# Patient Record
Sex: Female | Born: 1990 | Race: Black or African American | Hispanic: No | Marital: Single | Smoking: Current some day smoker
Health system: Southern US, Community
[De-identification: ages and names within clinical notes are randomized; demographics above are authoritative.]

## PROBLEM LIST (undated history)

## (undated) ENCOUNTER — Emergency Department (HOSPITAL_COMMUNITY): Disposition: A | Discharge status: Home / Self Care | Payer: PRIVATE HEALTH INSURANCE

## (undated) ENCOUNTER — Emergency Department (HOSPITAL_COMMUNITY): Admission: EM | Payer: Medicaid Other | Source: Home / Self Care

## (undated) DIAGNOSIS — F419 Anxiety disorder, unspecified: Secondary | ICD-10-CM

## (undated) DIAGNOSIS — R51 Headache: Secondary | ICD-10-CM

## (undated) DIAGNOSIS — D5700 Hb-SS disease with crisis, unspecified: Secondary | ICD-10-CM

## (undated) DIAGNOSIS — R011 Cardiac murmur, unspecified: Secondary | ICD-10-CM

## (undated) DIAGNOSIS — A539 Syphilis, unspecified: Secondary | ICD-10-CM

## (undated) DIAGNOSIS — D473 Essential (hemorrhagic) thrombocythemia: Secondary | ICD-10-CM

## (undated) HISTORY — DX: Headache: R51

## (undated) HISTORY — PX: SPLENECTOMY: SUR1306

## (undated) HISTORY — DX: Essential (hemorrhagic) thrombocythemia: D47.3

---
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
## 2012-01-02 ENCOUNTER — Emergency Department (HOSPITAL_COMMUNITY)
Admission: EM | Admit: 2012-01-02 | Discharge: 2012-01-02 | Disposition: A | Discharge status: Home / Self Care | Payer: Medicaid Other | Attending: Emergency Medicine | Admitting: Emergency Medicine

## 2012-01-02 ENCOUNTER — Encounter (HOSPITAL_COMMUNITY): Payer: Self-pay | Admitting: *Deleted

## 2012-01-02 DIAGNOSIS — D5700 Hb-SS disease with crisis, unspecified: Secondary | ICD-10-CM | POA: Insufficient documentation

## 2012-01-02 DIAGNOSIS — M79609 Pain in unspecified limb: Secondary | ICD-10-CM | POA: Insufficient documentation

## 2012-01-02 LAB — BASIC METABOLIC PANEL
BUN: 6 mg/dL (ref 6–23)
GFR calc Af Amer: >90 mL/min (ref >90)
GFR calc non Af Amer: >90 mL/min (ref >90)
Potassium: 3.6 mEq/L (ref 3.5–5.1)
Sodium: 133 mEq/L — ABNORMAL LOW (ref 135–145)

## 2012-01-02 LAB — URINALYSIS, ROUTINE W REFLEX MICROSCOPIC
Hgb urine dipstick: NEGATIVE
Protein, ur: NEGATIVE mg/dL
Urobilinogen, UA: 2 mg/dL — ABNORMAL HIGH (ref 0.0–1.0)

## 2012-01-02 LAB — DIFFERENTIAL
Basophils Absolute: 0.1 10*3/uL (ref 0.0–0.1)
Basophils Relative: 0 % (ref 0–1)
Eosinophils Absolute: 0 10*3/uL (ref 0.0–0.7)
Monocytes Absolute: 2.8 10*3/uL — ABNORMAL HIGH (ref 0.1–1.0)
Neutro Abs: 12.7 10*3/uL — ABNORMAL HIGH (ref 1.7–7.7)
Neutrophils Relative %: 69 % (ref 43–77)

## 2012-01-02 LAB — RETICULOCYTES
RBC.: 3.35 MIL/uL — ABNORMAL LOW (ref 3.87–5.11)
Retic Ct Pct: 11.9 % — ABNORMAL HIGH (ref 0.4–3.1)

## 2012-01-02 LAB — CBC
Hemoglobin: 9.8 g/dL — ABNORMAL LOW (ref 12.0–15.0)
MCHC: 35.5 g/dL (ref 30.0–36.0)
RBC: 3.35 MIL/uL — ABNORMAL LOW (ref 3.87–5.11)

## 2012-01-02 MED ORDER — HYDROMORPHONE HCL PF 1 MG/ML IJ SOLN
1.0000 mg | Freq: Once | INTRAMUSCULAR | Status: AC
  Administered 2012-01-02: 1 mg via INTRAVENOUS
  Filled 2012-01-02: qty 1

## 2012-01-02 MED ORDER — ONDANSETRON HCL 4 MG/2ML IJ SOLN
4.0000 mg | Freq: Once | INTRAMUSCULAR | Status: AC
  Administered 2012-01-02: 4 mg via INTRAVENOUS
  Filled 2012-01-02: qty 2

## 2012-01-02 MED ORDER — SODIUM CHLORIDE 0.9 % IV BOLUS (SEPSIS)
1000.0000 mL | Freq: Once | INTRAVENOUS | Status: AC
  Administered 2012-01-02: 1000 mL via INTRAVENOUS

## 2012-01-02 NOTE — ED Notes (Signed)
Patient arrived to ED via cab.  She is complaining of sickle cell crisis. She states that she is having pain in her legs and between her shoulders. Her pain level is rated at 10 of 10.

## 2012-01-02 NOTE — Discharge Instructions (Signed)
Sickle Cell Pain Crisis Sickle cell anemia requires regular medical attention by your healthcare provider and awareness about when to seek medical care. Pain is a common problem in children with sickle cell disease. This usually starts at less than 19 year of age. Pain can occur nearly anywhere in the body but most commonly occurs in the extremities, back, chest, or belly (abdomen). Pain episodes can start suddenly or may follow an illness. These attacks can appear as decreased activity, loss of appetite, change in behavior, or simply complaints of pain. DIAGNOSIS   Specialized blood and gene testing can help make this diagnosis early in the disease. Blood tests may then be done to watch blood levels.   Specialized brain scans are done when there are problems in the brain during a crisis.   Lung testing may be done later in the disease.  HOME CARE INSTRUCTIONS   Maintain good hydration. Increase you or your child's fluid intake in hot weather and during exercise.   Avoid smoking. Smoking lowers the oxygen in the blood and can cause the production of sickle-shaped cells (sickling).   Control pain. Only take over-the-counter or prescription medicines for pain, discomfort, or fever as directed by your caregiver. Do not give aspirin to children because of the association with Reye's syndrome.   Keep regular health care checks to keep a proper red blood cell (hemoglobin) level. A moderate anemia level protects against sickling crises.   You and your child should receive all the same immunizations and care as the people around you.   Mothers should breastfeed their babies if possible. Use formulas with iron added if breastfeeding is not possible. Additional iron should not be given unless there is a lack of it. People with sickle cell disease (SCD) build up iron faster than normal. Give folic acid and additional vitamins as directed.   If you or your child has been prescribed antibiotics or other  medications to prevent problems, take them as directed.   Summer camps are available for children with SCD. They may help young people deal with their disease. The camps introduce them to other children with the same problem.   Young people with SCD may become frustrated or angry at their disease. This can cause rebellion and refusal to follow medical care. Help groups or counseling may help with these problems.   Wear a medical alert bracelet. When traveling, keep medical information, caregiver's names, and the medications you or your child takes with you at all times.  SEEK IMMEDIATE MEDICAL CARE IF:   You or your child develops dizziness or fainting, numbness in or difficulty with movement of arms and legs, difficulty with speech, or is acting abnormally. This could be early signs of a stroke. Immediate treatment is necessary.   You or your child has an oral temperature above 102 F (38.9 C), not controlled by medicine.   You or your child has other signs of infection (chills, lethargy, irritability, poor eating, vomiting). The younger the child, the more you should be concerned.   With fevers, do not give medicine to lower the fever right away. This could cover up a problem that is developing. Notify your caregiver.   You or your child develops pain that is not helped with medicine.   You or your child develops shortness of breath or is coughing up pus-like or bloody sputum.   You or your child develops any problems that are new and are causing you to worry.   You or   your child develops a persistent, often uncomfortable and painful penile erection. This is called priapism. Always check young boys for this. It is often embarrassing for them and they may not bring it to your attention. This is a medical emergency and needs immediate treatment. If this is not treated it will lead to impotence.   You or your child develops a new onset of abdominal pain, especially on the left side near the  stomach area.   You or your child has any questions or has problems that are not getting better. Return immediately if you feel your child is getting worse, even if your child was seen only a short while ago.  Document Released: 07/28/2005 Document Revised: 10/07/2011 Document Reviewed: 12/17/2009 [employer] Patient Information 2012 [employer]. 

## 2012-01-02 NOTE — ED Provider Notes (Signed)
History     CSN: [id number]  Arrival date & time 01/02/12  1136   First MD Initiated Contact with Patient 01/02/12 1257    1:30 PM  HPI Patient reports bilateral lower extremity pain that began 2 days ago. Reports pain is typical for her sickle cell pain. Reports pain is worsening. Denies chest pain, abdominal pain, nausea, vomiting, headache, myalgias. Denies known aggravating factor. Reports she does not have any medication at home her sickle cell exacerbations Patient is a 21 y.o. female presenting with sickle cell pain. The history is provided by the patient.  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 The current episode started 2 days ago. The onset was gradual. The problem occurs continuously. The problem has been gradually worsening. The pain is present in the lower extremities. The pain is similar to prior episodes. The pain is severe. The symptoms are relieved by nothing. Pertinent negatives include no chest pain, no abdominal pain, no nausea, no vomiting, no hematuria, no headaches, no back pain, no neck pain, no tingling, no weakness, no difficulty breathing and no eye pain. There is no swelling present.    Past Medical History  Diagnosis Date  . Sickle cell crisis     Past Surgical History  Procedure Date  . Splenectomy     No family history on file.  History  Substance Use Topics  . Smoking status: Never Smoker   . Smokeless tobacco: Not on file  . Alcohol Use: No    OB History    Grav Para Term Preterm Abortions TAB SAB Ect Mult Living                  Review of Systems  Constitutional: Negative for fever and chills.  HENT: Negative for neck pain.   Eyes: Negative for pain.  Respiratory: Negative for shortness of breath.   Cardiovascular: Negative for chest pain.  Gastrointestinal: Negative for nausea, vomiting, abdominal pain and blood in stool.  Genitourinary: Negative for hematuria.  Musculoskeletal: Negative for back pain.       Lower  extremity pain  Neurological: Negative for tingling, weakness and headaches.  All other systems reviewed and are negative.    Allergies  Review of patient's allergies indicates no known allergies.  Home Medications   Current Outpatient Rx  Name Route Sig Dispense Refill  . IBUPROFEN 200 MG PO TABS Oral Take 400 mg by mouth every 6 (six) hours as needed. pain    . PENICILLIN V POTASSIUM 250 MG PO TABS Oral Take 250 mg by mouth 2 (two) times daily. 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      BP 147/85  Pulse 78  Temp(Src) 98.7 F (37.1 C) (Oral)  Resp 18  SpO2 95%  LMP 12/29/2011  Physical Exam  Vitals reviewed. Constitutional: She is oriented to person, place, and time. Vital signs are normal. She appears well-developed and well-nourished.  HENT:  Head: Normocephalic and atraumatic.  Eyes: Conjunctivae are normal. Pupils are equal, round, and reactive to light.  Neck: Normal range of motion. Neck supple.  Cardiovascular: Normal rate, regular rhythm and normal heart sounds.  Exam reveals no friction rub.   No murmur heard. Pulmonary/Chest: Effort normal and breath sounds normal. She has no wheezes. She has no rhonchi. She has no rales. She exhibits no tenderness.  Abdominal: Soft. Bowel sounds are normal. She exhibits no mass. There is no tenderness. There  is no rebound and no guarding.  Musculoskeletal: Normal range of motion. She exhibits no edema and no tenderness.  Neurological: She is alert and oriented to person, place, and time. Coordination normal.  Skin: Skin is warm and dry. No rash noted. No erythema. No pallor.    ED Course  Procedures  Results for orders placed during the hospital encounter of 01/02/12  BASIC METABOLIC PANEL      Component Value Range   Sodium 133 (*) 135 - 145 (mEq/L)   Potassium 3.6  3.5 - 5.1 (mEq/L)   Chloride 97  96 - 112 (mEq/L)   CO2 26  19 - 32 (mEq/L)   Glucose, Bld 91   70 - 99 (mg/dL)   BUN 6  6 - 23 (mg/dL)   Creatinine, Ser 1.61  0.50 - 1.10 (mg/dL)   Calcium 9.6  8.4 - 09.6 (mg/dL)   GFR calc non Af Amer >90  >90 (mL/min)   GFR calc Af Amer >90  >90 (mL/min)  CBC      Component Value Range   WBC 18.3 (*) 4.0 - 10.5 (K/uL)   RBC 3.35 (*) 3.87 - 5.11 (MIL/uL)   Hemoglobin 9.8 (*) 12.0 - 15.0 (g/dL)   HCT 04.5 (*) 40.9 - 46.0 (%)   MCV 82.4  78.0 - 100.0 (fL)   MCH 29.3  26.0 - 34.0 (pg)   MCHC 35.5  30.0 - 36.0 (g/dL)   RDW 81.1 (*) 91.4 - 15.5 (%)   Platelets 670 (*) 150 - 400 (K/uL)  RETICULOCYTES      Component Value Range   Retic Ct Pct 11.9 (*) 0.4 - 3.1 (%)   RBC. 3.35 (*) 3.87 - 5.11 (MIL/uL)   Retic Count, Manual 398.7 (*) 19.0 - 186.0 (K/uL)  URINALYSIS, ROUTINE W REFLEX MICROSCOPIC      Component Value Range   Color, Urine YELLOW  YELLOW    APPearance CLEAR  CLEAR    Specific Gravity, Urine 1.012  1.005 - 1.030    pH 7.0  5.0 - 8.0    Glucose, UA NEGATIVE  NEGATIVE (mg/dL)   Hgb urine dipstick NEGATIVE  NEGATIVE    Bilirubin Urine NEGATIVE  NEGATIVE    Ketones, ur TRACE (*) NEGATIVE (mg/dL)   Protein, ur NEGATIVE  NEGATIVE (mg/dL)   Urobilinogen, UA 2.0 (*) 0.0 - 1.0 (mg/dL)   Nitrite NEGATIVE  NEGATIVE    Leukocytes, UA NEGATIVE  NEGATIVE   DIFFERENTIAL      Component Value Range   Neutrophils Relative 69  43 - 77 (%)   Neutro Abs 12.7 (*) 1.7 - 7.7 (K/uL)   Lymphocytes Relative 15  12 - 46 (%)   Lymphs Abs 2.8  0.7 - 4.0 (K/uL)   Monocytes Relative 15 (*) 3 - 12 (%)   Monocytes Absolute 2.8 (*) 0.1 - 1.0 (K/uL)   Eosinophils Relative 0  0 - 5 (%)   Eosinophils Absolute 0.0  0.0 - 0.7 (K/uL)   Basophils Relative 0  0 - 1 (%)   Basophils Absolute 0.1  0.0 - 0.1 (K/uL)  PREGNANCY, URINE      Component Value Range   Preg Test, Ur NEGATIVE  NEGATIVE    No results found.   MDM   2:57 PM Patient reports improvement of pain. States pain is now an 8/10. Patient is no longer writhing in bed and appears to be resting  comfortably with the lights turned down. Will treat with one more milligram of Dilaudid  and reassess.   5:54 PM Has had a total of 3 mg Dilaudid. Reports pain is down to 4/10. States pain is now manageable. Will discharge home. Advised Tylenol and ibuprofen for pain as needed. Patient requests oral narcotic prescription. Advised patient that she should followup with primary care physician, and Tylenol and ibuprofen should be sufficient. Advise persisting use or narcotic medications can cause addiction and tolerance. Patient voices understanding and is ready for discharge     Thomasene Lot, Cordelia Poche 01/02/12 1756

## 2012-01-04 NOTE — ED Provider Notes (Signed)
Medical screening examination/treatment/procedure(s) were performed by non-physician practitioner and as supervising physician I was immediately available for consultation/collaboration.  Toy Baker, MD 01/04/12 [phone]

## 2012-01-11 ENCOUNTER — Emergency Department (HOSPITAL_COMMUNITY): Payer: Medicaid Other

## 2012-01-11 ENCOUNTER — Encounter (HOSPITAL_COMMUNITY): Payer: Self-pay

## 2012-01-11 ENCOUNTER — Emergency Department (HOSPITAL_COMMUNITY)
Admission: EM | Admit: 2012-01-11 | Discharge: 2012-01-11 | Disposition: A | Discharge status: Home / Self Care | Payer: Medicaid Other | Attending: Emergency Medicine | Admitting: Emergency Medicine

## 2012-01-11 ENCOUNTER — Other Ambulatory Visit: Payer: Self-pay

## 2012-01-11 DIAGNOSIS — R791 Abnormal coagulation profile: Secondary | ICD-10-CM | POA: Insufficient documentation

## 2012-01-11 DIAGNOSIS — R071 Chest pain on breathing: Secondary | ICD-10-CM | POA: Insufficient documentation

## 2012-01-11 DIAGNOSIS — Z79899 Other long term (current) drug therapy: Secondary | ICD-10-CM | POA: Insufficient documentation

## 2012-01-11 DIAGNOSIS — R000 Tachycardia, unspecified: Secondary | ICD-10-CM | POA: Insufficient documentation

## 2012-01-11 DIAGNOSIS — M940 Chondrocostal junction syndrome [Tietze]: Secondary | ICD-10-CM | POA: Insufficient documentation

## 2012-01-11 DIAGNOSIS — D571 Sickle-cell disease without crisis: Secondary | ICD-10-CM | POA: Insufficient documentation

## 2012-01-11 LAB — BASIC METABOLIC PANEL
Calcium: 9.8 mg/dL (ref 8.4–10.5)
GFR calc Af Amer: >90 mL/min (ref >90)
GFR calc non Af Amer: >90 mL/min (ref >90)
Glucose, Bld: 89 mg/dL (ref 70–99)
Potassium: 3.9 mEq/L (ref 3.5–5.1)
Sodium: 138 mEq/L (ref 135–145)

## 2012-01-11 LAB — D-DIMER, QUANTITATIVE: D-Dimer, Quant: 1.41 ug/mL-FEU — ABNORMAL HIGH (ref 0.00–0.48)

## 2012-01-11 LAB — DIFFERENTIAL
Basophils Absolute: 0 10*3/uL (ref 0.0–0.1)
Eosinophils Absolute: 0.1 10*3/uL (ref 0.0–0.7)
Lymphs Abs: 3.2 10*3/uL (ref 0.7–4.0)
Neutrophils Relative %: 73 % (ref 43–77)

## 2012-01-11 LAB — RETICULOCYTES
RBC.: 3.24 MIL/uL — ABNORMAL LOW (ref 3.87–5.11)
Retic Ct Pct: 5.7 % — ABNORMAL HIGH (ref 0.4–3.1)

## 2012-01-11 LAB — CBC
MCH: 28.1 pg (ref 26.0–34.0)
Platelets: 720 10*3/uL — ABNORMAL HIGH (ref 150–400)
RBC: 3.24 MIL/uL — ABNORMAL LOW (ref 3.87–5.11)
RDW: 15.6 % — ABNORMAL HIGH (ref 11.5–15.5)
WBC: 17.7 10*3/uL — ABNORMAL HIGH (ref 4.0–10.5)

## 2012-01-11 MED ORDER — IOHEXOL 300 MG/ML  SOLN
100.0000 mL | Freq: Once | INTRAMUSCULAR | Status: AC | PRN
  Administered 2012-01-11: 100 mL via INTRAVENOUS

## 2012-01-11 MED ORDER — NAPROXEN 500 MG PO TABS: 500.0000 mg | ORAL_TABLET | Freq: Two times a day (BID) | ORAL | Status: DC

## 2012-01-11 MED ORDER — SODIUM CHLORIDE 0.9 % IV BOLUS (SEPSIS)
1000.0000 mL | Freq: Once | INTRAVENOUS | Status: AC
  Administered 2012-01-11: 1000 mL via INTRAVENOUS

## 2012-01-11 MED ORDER — HYDROMORPHONE HCL PF 1 MG/ML IJ SOLN
1.0000 mg | Freq: Once | INTRAMUSCULAR | Status: AC
  Administered 2012-01-11: 1 mg via INTRAVENOUS
  Filled 2012-01-11: qty 1

## 2012-01-11 MED ORDER — KETOROLAC TROMETHAMINE 30 MG/ML IJ SOLN
30.0000 mg | Freq: Once | INTRAMUSCULAR | Status: AC
  Administered 2012-01-11: 30 mg via INTRAVENOUS
  Filled 2012-01-11: qty 1

## 2012-01-11 NOTE — ED Provider Notes (Signed)
History     CSN: [id number]  Arrival date & time 01/11/12  0865   First MD Initiated Contact with Patient 01/11/12 [phone]      Chief Complaint  Patient presents with  . Chest Pain    (Consider location/radiation/quality/duration/timing/severity/associated sxs/prior treatment) HPI Comments: Hx sickle cell disease. Complains of substernal cp  Patient is a 21 y.o. female presenting with chest pain. The history is provided by the patient. No language interpreter was used.  Chest Pain The chest pain began 1 - 2 weeks ago. Chest pain occurs constantly. The chest pain is worsening. The pain is associated with breathing. The severity of the pain is moderate. The quality of the pain is described as aching. The pain does not radiate. Chest pain is worsened by deep breathing and certain positions. Pertinent negatives for primary symptoms include no fever, no fatigue, no shortness of breath, no cough, no palpitations, no abdominal pain, no nausea, no vomiting and no dizziness.  Pertinent negatives for associated symptoms include no numbness and no weakness. She tried nothing for the symptoms. Risk factors include no known risk factors.     Past Medical History  Diagnosis Date  . Sickle cell crisis     Past Surgical History  Procedure Date  . Splenectomy     History reviewed. No pertinent family history.  History  Substance Use Topics  . Smoking status: Never Smoker   . Smokeless tobacco: Never Used  . Alcohol Use: No    OB History    Grav Para Term Preterm Abortions TAB SAB Ect Mult Living                  Review of Systems  Constitutional: Negative for fever, chills, activity change, appetite change and fatigue.  HENT: Negative for congestion, sore throat, rhinorrhea, neck pain and neck stiffness.   Respiratory: Negative for cough and shortness of breath.   Cardiovascular: Positive for chest pain. Negative for palpitations.  Gastrointestinal: Negative for nausea, vomiting and  abdominal pain.  Genitourinary: Negative for dysuria, urgency, frequency and flank pain.  Musculoskeletal: Negative for myalgias, back pain and arthralgias.  Neurological: Negative for dizziness, weakness, light-headedness, numbness and headaches.  All other systems reviewed and are negative.    Allergies  Review of patient's allergies indicates no known allergies.  Home Medications   Current Outpatient Rx  Name Route Sig Dispense Refill  . ACETAMINOPHEN 500 MG PO TABS Oral Take 500 mg by mouth every 6 (six) hours as needed. For pain    . IBUPROFEN 200 MG PO TABS Oral Take 400 mg by mouth every 6 (six) hours as needed. For pain    . PENICILLIN V POTASSIUM 250 MG PO TABS Oral Take 250 mg by mouth 2 (two) times daily. 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 NAPROXEN 500 MG PO TABS Oral Take 1 tablet (500 mg total) by mouth 2 (two) times daily. 30 tablet 0    BP 114/71  Pulse 95  Temp(Src) 97.1 F (36.2 C) (Oral)  Resp 17  SpO2 99%  LMP 12/29/2011  Physical Exam  Nursing note and vitals reviewed. Constitutional: She is oriented to person, place, and time. She appears well-developed and well-nourished. No distress.  HENT:  Head: Normocephalic and atraumatic.  Mouth/Throat: Oropharynx is clear and moist. No oropharyngeal exudate.  Eyes: Conjunctivae and EOM are normal. Pupils are equal, round, and reactive to light.  Neck: Normal range of motion. Neck supple.  Cardiovascular: Regular rhythm, normal heart sounds and intact distal pulses.  Exam reveals no gallop and no friction rub.   No murmur heard.      Tachycardic rate  Pulmonary/Chest: Effort normal and breath sounds normal. No respiratory distress. She exhibits tenderness.  Abdominal: Soft. Bowel sounds are normal. There is no tenderness.  Musculoskeletal: Normal range of motion. She exhibits no tenderness.  Neurological: She is alert and oriented to person, place, and time. No cranial nerve  deficit.  Skin: Skin is warm and dry. No rash noted.    ED Course  Procedures (including critical care time)   Date: 01/11/2012  Rate: 98  Rhythm: normal sinus rhythm  QRS Axis: normal  Intervals: normal  ST/T Wave abnormalities: normal  Conduction Disutrbances:none  Narrative Interpretation:   Old EKG Reviewed: unchanged  Labs Reviewed  CBC - Abnormal; Notable for the following:    WBC 17.7 (*)    RBC 3.24 (*)    Hemoglobin 9.1 (*)    HCT 26.2 (*)    RDW 15.6 (*)    Platelets 720 (*)    All other components within normal limits  DIFFERENTIAL - Abnormal; Notable for the following:    Neutro Abs 12.9 (*)    Monocytes Absolute 1.6 (*)    All other components within normal limits  D-DIMER, QUANTITATIVE - Abnormal; Notable for the following:    D-Dimer, Quant 1.41 (*)    All other components within normal limits  RETICULOCYTES - Abnormal; Notable for the following:    Retic Ct Pct 5.7 (*)    RBC. 3.24 (*)    All other components within normal limits  BASIC METABOLIC PANEL   Dg Chest 2 View  01/11/2012  *RADIOLOGY REPORT*  Clinical Data: Sickle cell crisis  CHEST - 2 VIEW  Comparison: 11/02/2010  Findings: The heart size and mediastinal contours are within normal limits.  Both lungs are clear. Skeletal changes of sickle cell disease identified within the spine.  IMPRESSION: No active cardiopulmonary abnormalities.  Original Report Authenticated By: Rosealee Albee, M.D.   Ct Angio Chest W/cm &/or Wo Cm  01/11/2012  *RADIOLOGY REPORT*  Clinical Data: Chest pain for the past week.  Sickle cell crisis.  CT ANGIOGRAPHY CHEST  Technique:  Multidetector CT imaging of the chest using the standard protocol during bolus administration of intravenous contrast. Multiplanar reconstructed images including MIPs were obtained and reviewed to evaluate the vascular anatomy.  Contrast: OMNIPAQUE IOHEXOL 300 MG/ML IJ SOLN  Comparison: 08/23/2010 and chest radiographs obtained earlier today.   Findings: The pulmonary arteries are suboptimally opacified with no pulmonary arterial filling defects seen.  Minimal atelectasis at both lung bases.  No lung masses or enlarged lymph nodes.  The patient previously had a prominent thymus gland. This has changed configuration, wider than previously demonstrated and decreased in AP diameter.  This is oval in shape superiorly. The patient has H- shaped vertebrae, compatible with the history of sickle cell disease.  On review of her labs, she is anemic.  IMPRESSION:  1.  No pulmonary emboli seen. 2.  Minimal bibasilar atelectasis. 3.  Overall increase in prominence of anterior mediastinal soft tissue.  This could represent an increasingly prominent thymus gland or anterior mediastinal extramedullary hematopoeisis.  Original Report Authenticated By: Darrol Angel, M.D.     1. Costochondritis, acute       MDM  Secondary to acute costochondritis. I have no concern about this being a sickle cell crisis or acute chest  syndrome. She is afebrile with no cough pain was treated numerous department she'll be discharged home. Ulnar embolus was ruled out after an elevated d-dimer with a CT angioma which was negative. Instructed to followup with her primary care physician.  Return precautions provided        Dayton Bailiff, MD 01/11/12 1315

## 2012-01-11 NOTE — ED Notes (Signed)
Patient reports that she is having pain under left breast area and has a history of sickle cell. Patient states this is not her typical pain. Patient reports pain began a week ago and has gotten progressively worse. Patient rates pain 5/10.  Patient states her last sickle cell crisis was 01/01/12

## 2012-04-05 ENCOUNTER — Encounter (HOSPITAL_COMMUNITY): Payer: Self-pay

## 2012-04-05 ENCOUNTER — Emergency Department (HOSPITAL_COMMUNITY)
Admission: EM | Admit: 2012-04-05 | Discharge: 2012-04-05 | Disposition: A | Discharge status: Home / Self Care | Payer: Self-pay | Attending: Emergency Medicine | Admitting: Emergency Medicine

## 2012-04-05 DIAGNOSIS — R109 Unspecified abdominal pain: Secondary | ICD-10-CM | POA: Insufficient documentation

## 2012-04-05 DIAGNOSIS — O99891 Other specified diseases and conditions complicating pregnancy: Secondary | ICD-10-CM | POA: Insufficient documentation

## 2012-04-05 DIAGNOSIS — Z3490 Encounter for supervision of normal pregnancy, unspecified, unspecified trimester: Secondary | ICD-10-CM

## 2012-04-05 LAB — URINALYSIS, ROUTINE W REFLEX MICROSCOPIC
Bilirubin Urine: NEGATIVE
Glucose, UA: NEGATIVE mg/dL
Hgb urine dipstick: NEGATIVE
Ketones, ur: NEGATIVE mg/dL
Protein, ur: NEGATIVE mg/dL
Urobilinogen, UA: 1 mg/dL (ref 0.0–1.0)

## 2012-04-05 MED ORDER — OXYCODONE-ACETAMINOPHEN 5-325 MG PO TABS
1.0000 | ORAL_TABLET | Freq: Once | ORAL | Status: AC
  Administered 2012-04-05: 1 via ORAL
  Filled 2012-04-05: qty 1

## 2012-04-05 NOTE — Discharge Instructions (Signed)
Call women's clinic listed above for an outpatient appointment to followup for your pregnancy.  Read the instructions below to learn more about pregnancy.  Return to the emergency department if you develop worsening abdominal pain or bleeding.  ABCs of Pregnancy A Antepartum care is very important. Be sure you see your doctor and get prenatal care as soon as you think you are pregnant. At this time, you will be tested for infection, genetic abnormalities and potential problems with you and the pregnancy. This is the time to discuss diet, exercise, work, medications, labor, pain medication during labor and the possibility of a cesarean delivery. Ask any questions that may concern you. It is important to see your doctor regularly throughout your pregnancy. Avoid exposure to toxic substances and chemicals - such as cleaning solvents, lead and mercury, some insecticides, and paint. Pregnant women should avoid exposure to paint fumes, and fumes that cause you to feel ill, dizzy or faint. When possible, it is a good idea to have a pre-pregnancy consultation with your caregiver to begin some important recommendations your caregiver suggests such as, taking folic acid, exercising, quitting smoking, avoiding alcoholic beverages, etc. B Breastfeeding is the healthiest choice for both you and your baby. It has many nutritional benefits for the baby and health benefits for the mother. It also creates a very tight and loving bond between the baby and mother. Talk to your doctor, your family and friends, and your employer about how you choose to feed your baby and how they can support you in your decision. Not all birth defects can be prevented, but a woman can take actions that may increase her chance of having a healthy baby. Many birth defects happen very early in pregnancy, sometimes before a woman even knows she is pregnant. Birth defects or abnormalities of any child in your or the father's family should be discussed  with your caregiver. Get a good support bra as your breast size changes. Wear it especially when you exercise and when nursing.  C Celebrate the news of your pregnancy with the your spouse/father and family. Childbirth classes are helpful to take for you and the spouse/father because it helps to understand what happens during the pregnancy, labor and delivery. Cesarean delivery should be discussed with your doctor so you are prepared for that possibility. The pros and cons of circumcision if it is a boy, should be discussed with your pediatrician. Cigarette smoking during pregnancy can result in low birth weight babies. It has been associated with infertility, miscarriages, tubal pregnancies, infant death (mortality) and poor health (morbidity) in childhood. Additionally, cigarette smoking may cause long-term learning disabilities. If you smoke, you should try to quit before getting pregnant and not smoke during the pregnancy. Secondary smoke may also harm a mother and her developing baby. It is a good idea to ask people to stop smoking around you during your pregnancy and after the baby is born. Extra calcium is necessary when you are pregnant and is found in your prenatal vitamin, in dairy products, green leafy vegetables and in calcium supplements. D A healthy diet according to your current weight and height, along with vitamins and mineral supplements should be discussed with your caregiver. Domestic abuse or violence should be made known to your doctor right away to get the situation corrected. Drink more water when you exercise to keep hydrated. Discomfort of your back and legs usually develops and progresses from the middle of the second trimester through to delivery of the baby.  This is because of the enlarging baby and uterus, which may also affect your balance. Do not take illegal drugs. Illegal drugs can seriously harm the baby and you. Drink extra fluids (water is best) throughout pregnancy to help  your body keep up with the increases in your blood volume. Drink at least 6 to 8 glasses of water, fruit juice, or milk each day. A good way to know you are drinking enough fluid is when your urine looks almost like clear water or is very light yellow.  E Eat healthy to get the nutrients you and your unborn baby need. Your meals should include the five basic food groups. Exercise (30 minutes of light to moderate exercise a day) is important and encouraged during pregnancy, if there are no medical problems or problems with the pregnancy. Exercise that causes discomfort or dizziness should be stopped and reported to your caregiver. Emotions during pregnancy can change from being ecstatic to depression and should be understood by you, your partner and your family. F Fetal screening with ultrasound, amniocentesis and monitoring during pregnancy and labor is common and sometimes necessary. Take 400 micrograms of folic acid daily both before, when possible, and during the first few months of pregnancy to reduce the risk of birth defects of the brain and spine. All women who could possibly become pregnant should take a vitamin with folic acid, every day. It is also important to eat a healthy diet with fortified foods (enriched grain products, including cereals, rice, breads, and pastas) and foods with natural sources of folate (orange juice, green leafy vegetables, beans, peanuts, broccoli, asparagus, peas, and lentils). The father should be involved with all aspects of the pregnancy including, the prenatal care, childbirth classes, labor, delivery, and postpartum time. Fathers may also have emotional concerns about being a father, financial needs, and raising a family. G Genetic testing should be done appropriately. It is important to know your family and the father's history. If there have been problems with pregnancies or birth defects in your family, report these to your doctor. Also, genetic counselors can talk  with you about the information you might need in making decisions about having a family. You can call a major medical center in your area for help in finding a board-certified genetic counselor. Genetic testing and counseling should be done before pregnancy when possible, especially if there is a history of problems in the mother's or father's family. Certain ethnic backgrounds are more at risk for genetic defects. H Get familiar with the hospital where you will be having your baby. Get to know how long it takes to get there, the labor and delivery area, and the hospital procedures. Be sure your medical insurance is accepted there. Get your home ready for the baby including, clothes, the baby's room (when possible), furniture and car seat. Hand washing is important throughout the day, especially after handling raw meat and poultry, changing the baby's diaper or using the bathroom. This can help prevent the spread of many bacteria and viruses that cause infection. Your hair may become dry and thinner, but will return to normal a few weeks after the baby is born. Heartburn is a common problem that can be treated by taking antacids recommended by your caregiver, eating smaller meals 5 or 6 times a day, not drinking liquids when eating, drinking between meals and raising the head of your bed 2 to 3 inches. I Insurance to cover you, the baby, doctor and hospital should be reviewed so that  you will be prepared to pay any costs not covered by your insurance plan. If you do not have medical insurance, there are usually clinics and services available for you in your community. Take 30 milligrams of iron during your pregnancy as prescribed by your doctor to reduce the risk of low red blood cells (anemia) later in pregnancy. All women of childbearing age should eat a diet rich in iron. J There should be a joint effort for the mother, father and any other children to adapt to the pregnancy financially, emotionally, and  psychologically during the pregnancy. Join a support group for moms-to-be. Or, join a class on parenting or childbirth. Have the family participate when possible. K Know your limits. Let your caregiver know if you experience any of the following:   Pain of any kind.   Strong cramps.   You develop a lot of weight in a short period of time (5 pounds in 3 to 5 days).   Vaginal bleeding, leaking of amniotic fluid.   Headache, vision problems.   Dizziness, fainting, shortness of breath.   Chest pain.   Fever of 102 F (38.9 C) or higher.   Gush of clear fluid from your vagina.   Painful urination.   Domestic violence.   Irregular heartbeat (palpitations).   Rapid beating of the heart (tachycardia).   Constant feeling sick to your stomach (nauseous) and vomiting.   Trouble walking, fluid retention (edema).   Muscle weakness.   If your baby has decreased activity.   Persistent diarrhea.   Abnormal vaginal discharge.   Uterine contractions at 20-minute intervals.   Back pain that travels down your leg.  L Learn and practice that what you eat and drink should be in moderation and healthy for you and your baby. Legal drugs such as alcohol and caffeine are important issues for pregnant women. There is no safe amount of alcohol a woman can drink while pregnant. Fetal alcohol syndrome, a disorder characterized by growth retardation, facial abnormalities, and central nervous system dysfunction, is caused by a woman's use of alcohol during pregnancy. Caffeine, found in tea, coffee, soft drinks and chocolate, should also be limited. Be sure to read labels when trying to cut down on caffeine during pregnancy. More than 200 foods, beverages, and over-the-counter medications contain caffeine and have a high salt content! There are coffees and teas that do not contain caffeine. M Medical conditions such as diabetes, epilepsy, and high blood pressure should be treated and kept under  control before pregnancy when possible, but especially during pregnancy. Ask your caregiver about any medications that may need to be changed or adjusted during pregnancy. If you are currently taking any medications, ask your caregiver if it is safe to take them while you are pregnant or before getting pregnant when possible. Also, be sure to discuss any herbs or vitamins you are taking. They are medicines, too! Discuss with your doctor all medications, prescribed and over-the-counter, that you are taking. During your prenatal visit, discuss the medications your doctor may give you during labor and delivery. N Never be afraid to ask your doctor or caregiver questions about your health, the progress of the pregnancy, family problems, stressful situations, and recommendation for a pediatrician, if you do not have one. It is better to take all precautions and discuss any questions or concerns you may have during your office visits. It is a good idea to write down your questions before you visit the doctor. O Over-the-counter cough and cold  remedies may contain alcohol or other ingredients that should be avoided during pregnancy. Ask your caregiver about prescription, herbs or over-the-counter medications that you are taking or may consider taking while pregnant.  P Physical activity during pregnancy can benefit both you and your baby by lessening discomfort and fatigue, providing a sense of well-being, and increasing the likelihood of early recovery after delivery. Light to moderate exercise during pregnancy strengthens the belly (abdominal) and back muscles. This helps improve posture. Practicing yoga, walking, swimming, and cycling on a stationary bicycle are usually safe exercises for pregnant women. Avoid scuba diving, exercise at high altitudes (over 3000 feet), skiing, horseback riding, contact sports, etc. Always check with your doctor before beginning any kind of exercise, especially during pregnancy and  especially if you did not exercise before getting pregnant. Q Queasiness, stomach upset and morning sickness are common during pregnancy. Eating a couple of crackers or dry toast before getting out of bed. Foods that you normally love may make you feel sick to your stomach. You may need to substitute other nutritious foods. Eating 5 or 6 small meals a day instead of 3 large ones may make you feel better. Do not drink with your meals, drink between meals. Questions that you have should be written down and asked during your prenatal visits. R Read about and make plans to baby-proof your home. There are important tips for making your home a safer environment for your baby. Review the tips and make your home safer for you and your baby. Read food labels regarding calories, salt and fat content in the food. S Saunas, hot tubs, and steam rooms should be avoided while you are pregnant. Excessive high heat may be harmful during your pregnancy. Your caregiver will screen and examine you for sexually transmitted diseases and genetic disorders during your prenatal visits. Learn the signs of labor. Sexual relations while pregnant is safe unless there is a medical or pregnancy problem and your caregiver advises against it. T Traveling long distances should be avoided especially in the third trimester of your pregnancy. If you do have to travel out of state, be sure to take a copy of your medical records and medical insurance plan with you. You should not travel long distances without seeing your doctor first. Most airlines will not allow you to travel after 36 weeks of pregnancy. Toxoplasmosis is an infection caused by a parasite that can seriously harm an unborn baby. Avoid eating undercooked meat and handling cat litter. Be sure to wear gloves when gardening. Tingling of the hands and fingers is not unusual and is due to fluid retention. This will go away after the baby is born. U Womb (uterus) size increases during  the first trimester. Your kidneys will begin to function more efficiently. This may cause you to feel the need to urinate more often. You may also leak urine when sneezing, coughing or laughing. This is due to the growing uterus pressing against your bladder, which lies directly in front of and slightly under the uterus during the first few months of pregnancy. If you experience burning along with frequency of urination or bloody urine, be sure to tell your doctor. The size of your uterus in the third trimester may cause a problem with your balance. It is advisable to maintain good posture and avoid wearing high heels during this time. An ultrasound of your baby may be necessary during your pregnancy and is safe for you and your baby. V Vaccinations are an important  concern for pregnant women. Get needed vaccines before pregnancy. Center for Disease Control (FootballExhibition.com.br) has clear guidelines for the use of vaccines during pregnancy. Review the list, be sure to discuss it with your doctor. Prenatal vitamins are helpful and healthy for you and the baby. Do not take extra vitamins except what is recommended. Taking too much of certain vitamins can cause overdose problems. Continuous vomiting should be reported to your caregiver. Varicose veins may appear especially if there is a family history of varicose veins. They should subside after the delivery of the baby. Support hose helps if there is leg discomfort. W Being overweight or underweight during pregnancy may cause problems. Try to get within 15 pounds of your ideal weight before pregnancy. Remember, pregnancy is not a time to be dieting! Do not stop eating or start skipping meals as your weight increases. Both you and your baby need the calories and nutrition you receive from a healthy diet. Be sure to consult with your doctor about your diet. There is a formula and diet plan available depending on whether you are overweight or underweight. Your caregiver or  nutritionist can help and advise you if necessary. X Avoid X-rays. If you must have dental work or diagnostic tests, tell your dentist or physician that you are pregnant so that extra care can be taken. X-rays should only be taken when the risks of not taking them outweigh the risk of taking them. If needed, only the minimum amount of radiation should be used. When X-rays are necessary, protective lead shields should be used to cover areas of the body that are not being X-rayed. Y Your baby loves you. Breastfeeding your baby creates a loving and very close bond between the two of you. Give your baby a healthy environment to live in while you are pregnant. Infants and children require constant care and guidance. Their health and safety should be carefully watched at all times. After the baby is born, rest or take a nap when the baby is sleeping. Z Get your ZZZs. Be sure to get plenty of rest. Resting on your side as often as possible, especially on your left side is advised. It provides the best circulation to your baby and helps reduce swelling. Try taking a nap for 30 to 45 minutes in the afternoon when possible. After the baby is born rest or take a nap when the baby is sleeping. Try elevating your feet for that amount of time when possible. It helps the circulation in your legs and helps reduce swelling.  Most information courtesy of the CDC. Document Released: 10/18/2005 Document Revised: 10/07/2011 Document Reviewed: 07/02/2009 [facility] Patient Information 2012 [place], [place].  RESOURCE GUIDE  Chronic Pain Problems: Contact [facility]  [phone] Patients need to be referred by their primary care doctor.  Insufficient Money for Medicine: Contact United Way:  call "211" or [facility] 732-597-1715.  No Primary Care Doctor: - Call [facility]  [phone] - can help you locate a primary care doctor that  accepts your insurance, provides certain services,  etc. - Physician Referral [phone]  Agencies that provide inexpensive medical care: - [facility]  [phone] - [facility]  [phone] - [facility]  [phone] [facility]  [phone] - Planned Parenthood  [phone] [facility]  [phone]  Medicaid-accepting [facility] Providers: - [facility]- [address]  [phone], Mon-Fri 9am-7pm, Sat  9am-1pm - [facility]- [address] [place], [place]  [phone] - [facility]- [address], [address] [place]  [phone] [facility]- [address]  [phone] - Renaye Rakers- [address] [place], [address], [phone]  Only accepts [place] [facility] [place] patients after they have their name  applied to their card  Self Pay (no insurance) in [facility]: - Sickle Cell Patients: Dr Willey Blade, [facility]  [address] [place], [phone] - [facility]- [address] [place]  [phone]       [facility] [place]- [address]       -     [facility]- see information above (Speak to [employer] if you do not have insurance)       -  [facility]- [address] [place], [phone]       -  [facility]- [address],  [phone]       -  [facility]- [address], [phone]       -  Dr Julio Sicks-  [address], [place], [phone]       -  [facility]- [address] [phone]       -  [facility]- [address] [phone], also [address] [phone]       -    [facility]- [address] [place], [phone], 1st & 3rd Saturday   every month, 10am-1pm  1) Find a Doctor and Pay Out of Pocket Although you won't have to find out who is covered by your insurance plan, it is a good idea to ask around and get recommendations. You  will then need to call the office and see if the doctor you have chosen will accept you as a new patient and what types of options they offer for patients who are self-pay. Some doctors offer discounts or will set up payment plans for their patients who do not have insurance, but you will need to ask so you aren't surprised when you get to your appointment.  2) Contact Your Local Health Department Not all health departments have doctors that can see patients for sick visits, but many do, so it is worth a call to see if yours does. If you don't know where your local health department is, you can check in your phone book. The CDC also has a tool to help you locate your state's health department, and many state websites also have listings of all of their local health departments.  3) Find a Walk-in Clinic If your illness is not likely to be very severe or complicated, you may want to try a walk in clinic. These are popping up all over the country in pharmacies, drugstores, and shopping centers. They're usually staffed by nurse practitioners or physician assistants that have been trained to treat common illnesses and complaints. They're usually fairly quick and inexpensive. However, if you have serious medical issues or chronic medical problems, these are probably not your best option  STD Testing - [facility] Department of [facility], STD Clinic, [address] [place], phone [phone] or [phone].  Monday - Friday, call for an appointment. [facility] Department of [employer], STD Clinic, [address], [place], phone [phone] or  [phone].  Monday - Friday, call for an appointment.  Abuse/Neglect: [facility] Child Abuse Hotline 516 658 4607 [facility] Child Abuse Hotline [phone] (After Hours)  Emergency Shelter:  [facility] 7753776772  Maternity Homes: - [facility] [phone] - [facility] 4237654942  MRSA Hotline #:   667 677 9961  [facility] Resources  [facility] [place]  United Way [facility] Dept. [address]                 [address]         [address] [place] [address]  Blondell Reveal Phone:  [phone]                                  Phone:  [phone]                   Phone:  [phone]  [facility] Mental Health, [phone] - [phone]       -     [facility] in [place], [address],                                  [phone], [facility] Child Abuse Hotline 346-348-1017 or 562-202-5194 (After Hours)   Behavioral Health Services  Substance Abuse Resources: - Alcohol and Drug Services  [phone] - [facility] [phone] - [facility] [phone] Floydene Flock [phone] - Residential & Outpatient Substance Abuse Program  [phone]  Psychological Services: [facility]  [phone] Services  [phone] - [facility], [phone] [place]. [address] [place], ACCESS LINE: [phone] or [phone], EntrepreneurLoan.co.za  Dental Assistance  If unable to pay or uninsured, contact:  [facility] or [facility]. to become qualified for the adult dental clinic.  Patients with Medicaid: [name] [facility] [phone] [address], [phone] [address] [phone]  If unable to pay, or uninsured, contact [facility] [phone]) or [facility] Department [phone] in [place], [phone] in [facility]) to become qualified for the adult dental clinic  Other Low-Cost Community Dental Services: - [facility]- [address] [place], [place],  [place], [address], [phone], [address], 2nd and 4th Thursday of the month at 6:30am.  10 clients each day by appointment, can sometimes see walk-in patients if someone does not show for an appointment. [facility]- [address] Ether [place]ins [place], [place], [address], [phone] - [facility]- [address], [place], [place], [address], [phone] - [facility]- [phone] [facility]- [phone] [facility] Department- [phone]

## 2012-04-05 NOTE — ED Provider Notes (Signed)
History     CSN: [id number]  Arrival date & time 04/05/12  1054   First MD Initiated Contact with Patient 04/05/12 1113      Chief Complaint  Patient presents with  . Abdominal Pain    (Consider location/radiation/quality/duration/timing/severity/associated sxs/prior treatment) HPI Comments: Patient is a 21 year old female with a history of sickle cell crisis presenting to the emergency department with a chief complaint of cramping lower abdominal pain that began yesterday.  Associated symptoms include dysuria, urinary frequency, urinary hesitancy.  Patient denies any nausea, vomiting, diarrhea, fever, vaginal bleeding, night sweats, chills, back pain, vaginal dc, SOB, CP, or lower extremity pain.  Patient states that this does not feel like a sickle cell crisis and that her primary concern is her urinary symptoms.  Patient has no other complaints at this time.  Patient is a 21 y.o. female presenting with abdominal pain. The history is provided by the patient.  Abdominal Pain The primary symptoms of the illness include abdominal pain.    Past Medical History  Diagnosis Date  . Sickle cell crisis     Past Surgical History  Procedure Date  . Splenectomy     History reviewed. No pertinent family history.  History  Substance Use Topics  . Smoking status: Never Smoker   . Smokeless tobacco: Never Used  . Alcohol Use: No    OB History    Grav Para Term Preterm Abortions TAB SAB Ect Mult Living                  Review of Systems  Gastrointestinal: Positive for abdominal pain.  All other systems reviewed and are negative.    Allergies  Review of patient's allergies indicates no known allergies.  Home Medications   Current Outpatient Rx  Name Route Sig Dispense Refill  . ACETAMINOPHEN 500 MG PO TABS Oral Take 500 mg by mouth every 6 (six) hours as needed. For pain    . IBUPROFEN 200 MG PO TABS Oral Take 400 mg by mouth every 6 (six) hours as needed. For pain       BP 125/64  Pulse 87  Temp(Src) 98.2 F (36.8 C) (Oral)  Resp 20  Ht 5\' 4"  (1.626 m)  Wt 175 lb (79.379 kg)  BMI 30.04 kg/m2  SpO2 100%  LMP 03/05/2012  Physical Exam  Nursing note and vitals reviewed. Constitutional: She is oriented to person, place, and time. She appears well-developed and well-nourished. No distress.  HENT:  Head: Normocephalic and atraumatic.  Eyes: Conjunctivae and EOM are normal.  Neck: Normal range of motion.  Pulmonary/Chest: Effort normal.  Abdominal:       Soft abdomen, bowel sounds present, mild suprapubic ttp.   Genitourinary:       No CVA ttp  Musculoskeletal: Normal range of motion.  Neurological: She is alert and oriented to person, place, and time.  Skin: Skin is warm and dry. No rash noted. She is not diaphoretic.  Psychiatric: She has a normal mood and affect. Her behavior is normal.    ED Course  Procedures (including critical care time)  Labs Reviewed  PREGNANCY, URINE - Abnormal; Notable for the following:    Preg Test, Ur POSITIVE (*)    All other components within normal limits  URINALYSIS, ROUTINE W REFLEX MICROSCOPIC   No results found.   No diagnosis found.    MDM  Pregnant  Patient is hemodynamically stable and in NAD prior to discharge. Diagnosis was discussed with patient who verbalizes  understanding and is agreeable to discharge. Advised for [facility] follow up and discussed return precautions.         Jaci Carrel, [place] 04/05/12 1241

## 2012-04-05 NOTE — ED Notes (Signed)
Patient reports that she is having intermittent sharp and cramping lower abd pain that began yesterday. Paitent c/o dysuria and frequency.  Patient denies fever, N/V/D.

## 2012-04-08 NOTE — ED Provider Notes (Signed)
History/physical exam/procedure(s) were performed by non-physician practitioner and as supervising physician I was immediately available for consultation/collaboration. I have reviewed all notes and am in agreement with care and plan.   Hilario Quarry, MD 04/08/12 1723

## 2012-05-25 ENCOUNTER — Emergency Department (HOSPITAL_COMMUNITY)
Admission: EM | Admit: 2012-05-25 | Discharge: 2012-05-25 | Disposition: A | Discharge status: Home / Self Care | Payer: Self-pay | Attending: Emergency Medicine | Admitting: Emergency Medicine

## 2012-05-25 ENCOUNTER — Encounter (HOSPITAL_COMMUNITY): Payer: Self-pay | Admitting: Emergency Medicine

## 2012-05-25 DIAGNOSIS — N760 Acute vaginitis: Secondary | ICD-10-CM | POA: Insufficient documentation

## 2012-05-25 DIAGNOSIS — A499 Bacterial infection, unspecified: Secondary | ICD-10-CM | POA: Insufficient documentation

## 2012-05-25 DIAGNOSIS — D571 Sickle-cell disease without crisis: Secondary | ICD-10-CM | POA: Insufficient documentation

## 2012-05-25 DIAGNOSIS — B9689 Other specified bacterial agents as the cause of diseases classified elsewhere: Secondary | ICD-10-CM | POA: Insufficient documentation

## 2012-05-25 LAB — URINALYSIS, ROUTINE W REFLEX MICROSCOPIC
Bilirubin Urine: NEGATIVE
Glucose, UA: NEGATIVE mg/dL
Ketones, ur: NEGATIVE mg/dL
Nitrite: NEGATIVE
Protein, ur: NEGATIVE mg/dL
Specific Gravity, Urine: 1.007 (ref 1.005–1.030)
Urobilinogen, UA: 1 mg/dL (ref 0.0–1.0)
pH: 7 (ref 5.0–8.0)

## 2012-05-25 LAB — URINE MICROSCOPIC-ADD ON

## 2012-05-25 LAB — WET PREP, GENITAL
Trich, Wet Prep: NONE SEEN
Yeast Wet Prep HPF POC: NONE SEEN

## 2012-05-25 MED ORDER — METRONIDAZOLE 500 MG PO TABS: 500.0000 mg | ORAL_TABLET | Freq: Two times a day (BID) | ORAL | Status: AC

## 2012-05-25 NOTE — ED Notes (Signed)
Pt c/o vaginal pain and states there's a bump on her vagina as well x2 days.  Reports having an abortion on 05/05/2012.  Denies abnormal discharge or blood.

## 2012-05-25 NOTE — ED Notes (Signed)
Pelvic cart set up 

## 2012-05-25 NOTE — ED Notes (Addendum)
Pt presenting to ed with c/o vaginal pain and "I have a bump and swelling in my vaginal area" pt denies vaginal discharge at this time. Pt also c/o dysuria

## 2012-05-26 LAB — GC/CHLAMYDIA PROBE AMP, GENITAL
Chlamydia, DNA Probe: NEGATIVE
GC Probe Amp, Genital: NEGATIVE

## 2012-05-30 NOTE — ED Provider Notes (Signed)
History    Female with vaginal pain. She states mild dysuria. No discharge. No bleeding. No fevers or chills. No abdominal or back pain. No nausea or vomiting. Patient also reports a small "bump" near the right labia majora. Denies trauma. Status post elective abortion in July 5. CSN: [id number]  Arrival date & time 05/25/12  1337   First MD Initiated Contact with Patient 05/25/12 1409      Chief Complaint  Patient presents with  . Vaginal Pain    (Consider location/radiation/quality/duration/timing/severity/associated sxs/prior treatment) HPI  Past Medical History  Diagnosis Date  . Sickle cell crisis     Past Surgical History  Procedure Date  . Splenectomy     No family history on file.  History  Substance Use Topics  . Smoking status: Never Smoker   . Smokeless tobacco: Never Used  . Alcohol Use: No    OB History    Grav Para Term Preterm Abortions TAB SAB Ect Mult Living                  Review of Systems   Review of symptoms negative unless otherwise noted in HPI.   Allergies  Review of patient's allergies indicates no known allergies.  Home Medications   Current Outpatient Rx  Name Route Sig Dispense Refill  . ACETAMINOPHEN 500 MG PO TABS Oral Take 500 mg by mouth every 6 (six) hours as needed. For pain    . METRONIDAZOLE 500 MG PO TABS Oral Take 1 tablet (500 mg total) by mouth 2 (two) times daily. 10 tablet 0    BP 128/78  Pulse 69  Temp 98.4 F (36.9 C) (Oral)  Resp 14  SpO2 97%  Physical Exam  Nursing note and vitals reviewed. Constitutional: She appears well-developed and well-nourished. No distress.  HENT:  Head: Normocephalic and atraumatic.  Eyes: Conjunctivae are normal. Right eye exhibits no discharge. Left eye exhibits no discharge.  Neck: Neck supple.  Cardiovascular: Normal rate, regular rhythm and normal heart sounds.  Exam reveals no gallop and no friction rub.   No murmur heard. Pulmonary/Chest: Effort normal and breath  sounds normal. No respiratory distress.  Abdominal: Soft. She exhibits no distension. There is no tenderness.  Genitourinary:       Normal external female genitalia. Inferior aspect of the right labia with mild folliculitis. No other concerning lesions noted. No significant discharge. No bleeding. No cervical lesions. No cervical motion tenderness. No adnexal mass or tenderness. No Costovertebral angle tenderness  Musculoskeletal: She exhibits no edema and no tenderness.  Neurological: She is alert.  Skin: Skin is warm and dry.  Psychiatric: She has a normal mood and affect. Her behavior is normal. Thought content normal.    ED Course  Procedures (including critical care time)  Labs Reviewed  URINALYSIS, ROUTINE W REFLEX MICROSCOPIC - Abnormal; Notable for the following:    APPearance CLOUDY (*)     Hgb urine dipstick LARGE (*)     Leukocytes, UA MODERATE (*)     All other components within normal limits  WET PREP, GENITAL - Abnormal; Notable for the following:    Clue Cells Wet Prep HPF POC FEW (*)     WBC, Wet Prep HPF POC FEW (*)     All other components within normal limits  URINE MICROSCOPIC-ADD ON - Abnormal; Notable for the following:    Squamous Epithelial / LPF FEW (*)     All other components within normal limits  POCT PREGNANCY, URINE  GC/CHLAMYDIA PROBE AMP, GENITAL  LAB REPORT - SCANNED   No results found.   1. BV (bacterial vaginosis)       MDM  Old female with vaginal pain. Workup significant for bacterial vaginosis. Pelvic exam is unremarkable aside from some mild folliculitis of the perineal area. No evidence of abscess. Abdominal exam is benign.        Raeford Razor, MD 05/30/12 1016

## 2012-07-13 ENCOUNTER — Encounter (HOSPITAL_COMMUNITY): Payer: Self-pay | Admitting: Family Medicine

## 2012-07-13 ENCOUNTER — Emergency Department (HOSPITAL_COMMUNITY)
Admission: EM | Admit: 2012-07-13 | Discharge: 2012-07-13 | Disposition: A | Discharge status: Home / Self Care | Payer: Medicaid Other | Attending: Emergency Medicine | Admitting: Emergency Medicine

## 2012-07-13 DIAGNOSIS — M79609 Pain in unspecified limb: Secondary | ICD-10-CM

## 2012-07-13 DIAGNOSIS — D5700 Hb-SS disease with crisis, unspecified: Secondary | ICD-10-CM | POA: Insufficient documentation

## 2012-07-13 LAB — CBC WITH DIFFERENTIAL/PLATELET
Eosinophils Absolute: 0.2 10*3/uL (ref 0.0–0.7)
Eosinophils Relative: 1 % (ref 0–5)
MCHC: 35.8 g/dL (ref 30.0–36.0)
Monocytes Relative: 13 % — ABNORMAL HIGH (ref 3–12)
Neutro Abs: 4.1 10*3/uL (ref 1.7–7.7)
Neutrophils Relative %: 38 % — ABNORMAL LOW (ref 43–77)
RDW: 16.3 % — ABNORMAL HIGH (ref 11.5–15.5)

## 2012-07-13 LAB — BASIC METABOLIC PANEL
BUN: 8 mg/dL (ref 6–23)
CO2: 25 mEq/L (ref 19–32)
Calcium: 9.3 mg/dL (ref 8.4–10.5)
GFR calc non Af Amer: >90 mL/min (ref >90)
Glucose, Bld: 89 mg/dL (ref 70–99)
Sodium: 141 mEq/L (ref 135–145)

## 2012-07-13 LAB — URINALYSIS, ROUTINE W REFLEX MICROSCOPIC
Bilirubin Urine: NEGATIVE
Glucose, UA: NEGATIVE mg/dL
Hgb urine dipstick: NEGATIVE
Protein, ur: NEGATIVE mg/dL
Specific Gravity, Urine: 1.017 (ref 1.005–1.030)

## 2012-07-13 MED ORDER — DIPHENHYDRAMINE HCL 50 MG/ML IJ SOLN
25.0000 mg | Freq: Once | INTRAMUSCULAR | Status: AC
  Administered 2012-07-13: 25 mg via INTRAVENOUS
  Filled 2012-07-13: qty 1

## 2012-07-13 MED ORDER — HYDROMORPHONE HCL PF 1 MG/ML IJ SOLN
1.0000 mg | Freq: Once | INTRAMUSCULAR | Status: AC
  Administered 2012-07-13: 1 mg via INTRAVENOUS
  Filled 2012-07-13: qty 1

## 2012-07-13 MED ORDER — KETOROLAC TROMETHAMINE 30 MG/ML IJ SOLN
30.0000 mg | Freq: Once | INTRAMUSCULAR | Status: AC
  Administered 2012-07-13: 30 mg via INTRAVENOUS
  Filled 2012-07-13: qty 1

## 2012-07-13 MED ORDER — PROMETHAZINE HCL 25 MG/ML IJ SOLN
12.5000 mg | Freq: Once | INTRAMUSCULAR | Status: AC
  Administered 2012-07-13: 12.5 mg via INTRAVENOUS
  Filled 2012-07-13: qty 1

## 2012-07-13 MED ORDER — ONDANSETRON HCL 4 MG/2ML IJ SOLN
4.0000 mg | INTRAMUSCULAR | Status: AC
  Administered 2012-07-13: 4 mg via INTRAVENOUS
  Filled 2012-07-13: qty 2

## 2012-07-13 MED ORDER — SODIUM CHLORIDE 0.9 % IV BOLUS (SEPSIS)
1000.0000 mL | Freq: Once | INTRAVENOUS | Status: AC
  Administered 2012-07-13: 1000 mL via INTRAVENOUS

## 2012-07-13 MED ORDER — MORPHINE SULFATE 4 MG/ML IJ SOLN
4.0000 mg | Freq: Once | INTRAMUSCULAR | Status: AC
  Administered 2012-07-13: 4 mg via INTRAVENOUS
  Filled 2012-07-13: qty 1

## 2012-07-13 MED ORDER — OXYCODONE-ACETAMINOPHEN 5-325 MG PO TABS
ORAL_TABLET | ORAL | Status: AC
Start: 2012-07-13 — End: 2012-07-23

## 2012-07-13 NOTE — ED Notes (Signed)
Patient states she has been having pain related to her sickle cell for the past 6 hours. C/o left leg pain.

## 2012-07-13 NOTE — Progress Notes (Signed)
*  Preliminary Results* Left lower extremity venous duplex completed. Left lower extremity is negative for deep vein thrombosis. Preliminary results discussed with RN Clarisse Gouge, will inform pt nurse Enrique Sack.  07/13/2012 9:23 AM Gertie Fey, RDMS, RDCS

## 2012-07-13 NOTE — ED Notes (Signed)
PA at bedside.

## 2012-07-13 NOTE — ED Provider Notes (Signed)
History     CSN: [id number]  Arrival date & time 07/13/12  0456   None     Chief Complaint  Patient presents with  . Sickle Cell Pain Crisis    (Consider location/radiation/quality/duration/timing/severity/associated sxs/prior treatment) HPI  21 y.o. female in moderate distress crying complaining of left leg pain worsening over the course of 12 hours. This location is atypical for her sickle cell crises however she states that they move all over her not in one particular place. Patient denies any fever, cough, shortness of breath, nausea, vomiting, dysuria, frequency. Pain is 10 out of 10, located in the left calf radiating to the ankle. she is taking acetaminophen with no relief. She denies prior trauma to that area.  PCP August Saucer  Past Medical History  Diagnosis Date  . Sickle cell crisis     Past Surgical History  Procedure Date  . Splenectomy     No family history on file.  History  Substance Use Topics  . Smoking status: Never Smoker   . Smokeless tobacco: Never Used  . Alcohol Use: No    OB History    Grav Para Term Preterm Abortions TAB SAB Ect Mult Living                  Review of Systems  Constitutional: Negative for fever.  Respiratory: Negative for shortness of breath.   Cardiovascular: Negative for chest pain.  Gastrointestinal: Negative for nausea, vomiting, abdominal pain and diarrhea.  All other systems reviewed and are negative.    Allergies  Review of patient's allergies indicates no known allergies.  Home Medications   Current Outpatient Rx  Name Route Sig Dispense Refill  . ACETAMINOPHEN 500 MG PO TABS Oral Take 1,000 mg by mouth every 6 (six) hours as needed. For pain      BP 123/80  Pulse 89  Temp 98.3 F (36.8 C) (Oral)  Resp 17  Ht 5\' 4"  (1.626 m)  Wt 175 lb (79.379 kg)  BMI 30.04 kg/m2  SpO2 96%  LMP 07/03/2012  Physical Exam  Nursing note and vitals reviewed. Constitutional: She is oriented to person, place, and time.  She appears well-developed and well-nourished. No distress.  HENT:  Head: Normocephalic.  Eyes: Conjunctivae normal and EOM are normal. Pupils are equal, round, and reactive to light.  Neck: Normal range of motion.  Cardiovascular: Normal rate, regular rhythm, normal heart sounds and intact distal pulses.   Pulmonary/Chest: Effort normal and breath sounds normal. No stridor. No respiratory distress. She has no wheezes. She has no rales. She exhibits no tenderness.  Abdominal: Soft. Bowel sounds are normal. She exhibits no distension and no mass. There is no tenderness. There is no rebound and no guarding.  Musculoskeletal: Normal range of motion.       No swelling to left calf lower extremity is diffusely tender, no superficial collateral veins, no signs of induration  Neurological: She is alert and oriented to person, place, and time.  Skin: Skin is warm.  Psychiatric: She has a normal mood and affect.    ED Course  Procedures (including critical care time)  Labs Reviewed  CBC WITH DIFFERENTIAL - Abnormal; Notable for the following:    WBC 11.1 (*)     RBC 3.36 (*)     Hemoglobin 9.8 (*)     HCT 27.4 (*)     RDW 16.3 (*)     Platelets 639 (*)     Neutrophils Relative 38 (*)  Lymphocytes Relative 48 (*)     Lymphs Abs 5.3 (*)     Monocytes Relative 13 (*)     Monocytes Absolute 1.4 (*)     All other components within normal limits  BASIC METABOLIC PANEL  URINALYSIS, ROUTINE W REFLEX MICROSCOPIC   No results found.   Date: 07/13/2012  Rate: 94  Rhythm: normal sinus rhythm  QRS Axis: normal  Intervals: normal  ST/T Wave abnormalities: normal  Conduction Disutrbances:none  Narrative Interpretation:   Old EKG Reviewed: unchanged    1. Sickle cell pain crisis       MDM  Based on the location of pain and the fact that this is atypical for her prior pain crises I will rule out DVT with a venous Doppler.  7:01 AM patient states the morphine has not helped her at  all. She states she normally gets Dilaudid. I will give her Dilaudid 1 mg with 30 mg of Toradol and bolus her.  Study DVT study is negative, and blood work and urinalysis are normal patient's hemoglobin is 9.8 which is consistent with her previous readings. Patient was reevaluated in pain is largely resolved at this time. I will discharge her on Percocet and instructed to follow with Dr. August Saucer early next week.    Pt verbalized understanding and agrees with care plan. Outpatient follow-up and return precautions given.           Wynetta Emery, PA-C 07/13/12 1006

## 2012-07-19 NOTE — ED Provider Notes (Signed)
Medical screening examination/treatment/procedure(s) were performed by non-physician practitioner and as supervising physician I was immediately available for consultation/collaboration.   Laray Anger, DO 07/19/12 1253

## 2012-09-14 ENCOUNTER — Emergency Department (HOSPITAL_COMMUNITY)
Admission: EM | Admit: 2012-09-14 | Discharge: 2012-09-14 | Disposition: A | Discharge status: Home / Self Care | Payer: PRIVATE HEALTH INSURANCE | Attending: Emergency Medicine | Admitting: Emergency Medicine

## 2012-09-14 ENCOUNTER — Encounter (HOSPITAL_COMMUNITY): Payer: Self-pay | Admitting: *Deleted

## 2012-09-14 DIAGNOSIS — Z79899 Other long term (current) drug therapy: Secondary | ICD-10-CM | POA: Insufficient documentation

## 2012-09-14 DIAGNOSIS — A6009 Herpesviral infection of other urogenital tract: Secondary | ICD-10-CM

## 2012-09-14 DIAGNOSIS — Z862 Personal history of diseases of the blood and blood-forming organs and certain disorders involving the immune mechanism: Secondary | ICD-10-CM | POA: Insufficient documentation

## 2012-09-14 DIAGNOSIS — A6000 Herpesviral infection of urogenital system, unspecified: Secondary | ICD-10-CM | POA: Insufficient documentation

## 2012-09-14 LAB — URINALYSIS, ROUTINE W REFLEX MICROSCOPIC
Glucose, UA: NEGATIVE mg/dL
Hgb urine dipstick: NEGATIVE
Leukocytes, UA: NEGATIVE
pH: 7 (ref 5.0–8.0)

## 2012-09-14 LAB — PREGNANCY, URINE: Preg Test, Ur: NEGATIVE

## 2012-09-14 MED ORDER — VALACYCLOVIR HCL 1 G PO TABS: 1000.0000 mg | ORAL_TABLET | Freq: Two times a day (BID) | ORAL | Status: DC

## 2012-09-14 NOTE — ED Notes (Signed)
Pt from home with c/o vaginal pain- sts that when she urinates it burns and that she has had tenderness in the labia area.

## 2012-09-14 NOTE — ED Provider Notes (Signed)
History     CSN: [id number]  Arrival date & time 09/14/12  2221   First MD Initiated Contact with Patient 09/14/12 2248      Chief Complaint  Patient presents with  . Vaginal Pain    (Consider location/radiation/quality/duration/timing/severity/associated sxs/prior treatment) Patient is a 21 y.o. female presenting with vaginal pain. The history is provided by the patient, medical records and a significant other.  Vaginal Pain Pertinent negatives include no abdominal pain, chest pain, coughing, diaphoresis, fatigue, fever, headaches, nausea, rash or vomiting.    Pt with pain that began 2 days ago.  Pt shaves her labial region.  She has a Hx of ingrown hair in the groin area.  She denies vaginal discharge, vaginal bleeding, pelvic pain, abdominal pain, nausea, vomiting, diarrhea.  The symptoms began gradually, have been persistent and garadually worsened.  Pt has used vagisil on the area without relief.  Nothing makes the pain better, nothing makes it worse.  She has associated itching and swelling of the area.  The itching is worse when she urinates.  She also has dysuria, but no frequency, urgency or hematuria.  Pt endorses and burning 1-2 days before the pain began.  Pt has no STI Hx.  She has one sexual partner and uses contraception occasionally.    Past Medical History  Diagnosis Date  . Sickle cell crisis     Past Surgical History  Procedure Date  . Splenectomy     No family history on file.  History  Substance Use Topics  . Smoking status: Never Smoker   . Smokeless tobacco: Never Used  . Alcohol Use: No    OB History    Grav Para Term Preterm Abortions TAB SAB Ect Mult Living                  Review of Systems  Constitutional: Negative for fever, diaphoresis, appetite change, fatigue and unexpected weight change.  HENT: Negative for mouth sores and neck stiffness.   Eyes: Negative for visual disturbance.  Respiratory: Negative for cough, chest tightness,  shortness of breath and wheezing.   Cardiovascular: Negative for chest pain.  Gastrointestinal: Negative for nausea, vomiting, abdominal pain, diarrhea and constipation.  Genitourinary: Positive for dysuria and vaginal pain. Negative for urgency, frequency and hematuria.  Skin: Negative for rash.  Neurological: Negative for syncope, light-headedness and headaches.  Psychiatric/Behavioral: Negative for sleep disturbance. The patient is not nervous/anxious.   All other systems reviewed and are negative.    Allergies  Review of patient's allergies indicates no known allergies.  Home Medications   Current Outpatient Rx  Name  Route  Sig  Dispense  Refill  . VITAMIN D 1000 UNITS PO TABS   Oral   Take 1,000 Units by mouth daily.         Marland Kitchen VALACYCLOVIR HCL 1 G PO TABS   Oral   Take 1 tablet (1,000 mg total) by mouth 2 (two) times daily.   20 tablet   0     BP 120/80  Pulse 92  Temp 98 F (36.7 C) (Oral)  Resp 16  Ht 5\' 3"  (1.6 m)  Wt 170 lb (77.111 kg)  BMI 30.11 kg/m2  SpO2 99%  LMP 09/07/2012  Physical Exam  Nursing note and vitals reviewed. Constitutional: She appears well-developed and well-nourished. No distress.  HENT:  Head: Normocephalic and atraumatic.  Mouth/Throat: Oropharynx is clear and moist. No oropharyngeal exudate.  Eyes: Conjunctivae normal are normal. No scleral icterus.  Neck:  Normal range of motion. Neck supple.  Cardiovascular: Normal rate, regular rhythm, normal heart sounds and intact distal pulses.  Exam reveals no gallop and no friction rub.   No murmur heard. Pulmonary/Chest: Effort normal and breath sounds normal. No respiratory distress. She has no wheezes.  Abdominal: Soft. Bowel sounds are normal. She exhibits no mass. There is no tenderness. There is no rebound and no guarding.  Genitourinary:    Pelvic exam was performed with patient supine. There is lesion on the right labia. No erythema, tenderness or bleeding around the vagina. No  foreign body around the vagina. No signs of injury around the vagina. No vaginal discharge found.       3 small ulcerated lesions on the labia, TTP  Musculoskeletal: Normal range of motion. She exhibits no edema.  Neurological: She is alert.       Speech is clear and goal oriented Moves extremities without ataxia  Skin: Skin is warm and dry. She is not diaphoretic.  Psychiatric: She has a normal mood and affect.    ED Course  Procedures (including critical care time)   Labs Reviewed  URINALYSIS, ROUTINE W REFLEX MICROSCOPIC  PREGNANCY, URINE   No results found. Results for orders placed during the hospital encounter of 09/14/12  URINALYSIS, ROUTINE W REFLEX MICROSCOPIC      Component Value Range   Color, Urine YELLOW  YELLOW   APPearance CLEAR  CLEAR   Specific Gravity, Urine 1.012  1.005 - 1.030   pH 7.0  5.0 - 8.0   Glucose, UA NEGATIVE  NEGATIVE mg/dL   Hgb urine dipstick NEGATIVE  NEGATIVE   Bilirubin Urine NEGATIVE  NEGATIVE   Ketones, ur NEGATIVE  NEGATIVE mg/dL   Protein, ur NEGATIVE  NEGATIVE mg/dL   Urobilinogen, UA 1.0  0.0 - 1.0 mg/dL   Nitrite NEGATIVE  NEGATIVE   Leukocytes, UA NEGATIVE  NEGATIVE  PREGNANCY, URINE      Component Value Range   Preg Test, Ur NEGATIVE  NEGATIVE   No results found.    1. Genital herpes in women       MDM  [name] presents with Hx and PE consistent with genital herpes.  Patient to be discharged with instructions to follow up with PCP or OBGYN. Discussed importance of using protection when sexually active.  Pt without c/o vaginal discharge, therefore no speculum exam was performed and No GC/Chlamy cultures collected.  Pt was given strict instructions to return if she develops vaginal discharge, dyspareunia or other vaginal complaints.    1. Medications: valcyclovir 2. Treatment: rest, drink plenty of fluids, take medications as prescribed 3. Follow Up: with PCP or OBGYN for future outbreaks         Dierdre Forth, PA-C 09/14/12 2341

## 2012-09-17 NOTE — ED Provider Notes (Signed)
Medical screening examination/treatment/procedure(s) were performed by non-physician practitioner and as supervising physician I was immediately available for consultation/collaboration.   Richardean Canal, MD 09/17/12 2258

## 2012-09-18 ENCOUNTER — Emergency Department (HOSPITAL_COMMUNITY)
Admission: EM | Admit: 2012-09-18 | Discharge: 2012-09-19 | Disposition: A | Discharge status: Home / Self Care | Payer: PRIVATE HEALTH INSURANCE | Attending: Emergency Medicine | Admitting: Emergency Medicine

## 2012-09-18 DIAGNOSIS — D5700 Hb-SS disease with crisis, unspecified: Secondary | ICD-10-CM

## 2012-09-18 DIAGNOSIS — Z79899 Other long term (current) drug therapy: Secondary | ICD-10-CM | POA: Insufficient documentation

## 2012-09-18 LAB — CBC WITH DIFFERENTIAL/PLATELET
Basophils Absolute: 0 10*3/uL (ref 0.0–0.1)
HCT: 27.1 % — ABNORMAL LOW (ref 36.0–46.0)
Hemoglobin: 9.5 g/dL — ABNORMAL LOW (ref 12.0–15.0)
Lymphocytes Relative: 41 % (ref 12–46)
Monocytes Absolute: 1.5 10*3/uL — ABNORMAL HIGH (ref 0.1–1.0)
Neutro Abs: 6.2 10*3/uL (ref 1.7–7.7)
RDW: 16 % — ABNORMAL HIGH (ref 11.5–15.5)
WBC: 13.2 10*3/uL — ABNORMAL HIGH (ref 4.0–10.5)

## 2012-09-18 LAB — URINALYSIS, ROUTINE W REFLEX MICROSCOPIC
Bilirubin Urine: NEGATIVE
Glucose, UA: NEGATIVE mg/dL
Hgb urine dipstick: NEGATIVE
Specific Gravity, Urine: 1.017 (ref 1.005–1.030)
pH: 6 (ref 5.0–8.0)

## 2012-09-18 LAB — COMPREHENSIVE METABOLIC PANEL
ALT: 33 U/L (ref 0–35)
AST: 36 U/L (ref 0–37)
Albumin: 4 g/dL (ref 3.5–5.2)
CO2: 26 mEq/L (ref 19–32)
Calcium: 9.4 mg/dL (ref 8.4–10.5)
GFR calc non Af Amer: >90 mL/min (ref >90)
Sodium: 138 mEq/L (ref 135–145)
Total Protein: 7.9 g/dL (ref 6.0–8.3)

## 2012-09-18 LAB — RETICULOCYTES: Retic Count, Absolute: 393.9 10*3/uL — ABNORMAL HIGH (ref 19.0–186.0)

## 2012-09-18 MED ORDER — FENTANYL CITRATE 0.05 MG/ML IJ SOLN
50.0000 ug | Freq: Once | INTRAMUSCULAR | Status: AC
  Administered 2012-09-18: 50 ug via INTRAVENOUS
  Filled 2012-09-18: qty 2

## 2012-09-18 MED ORDER — DEXTROSE-NACL 5-0.45 % IV SOLN
INTRAVENOUS | Status: DC
  Administered 2012-09-18: 21:00:00 via INTRAVENOUS

## 2012-09-18 MED ORDER — ONDANSETRON HCL 4 MG/2ML IJ SOLN
4.0000 mg | Freq: Once | INTRAMUSCULAR | Status: AC
  Administered 2012-09-18: 4 mg via INTRAVENOUS
  Filled 2012-09-18: qty 2

## 2012-09-18 MED ORDER — PROMETHAZINE HCL 25 MG/ML IJ SOLN
12.5000 mg | INTRAMUSCULAR | Status: DC | PRN
  Administered 2012-09-18: 12.5 mg via INTRAVENOUS
  Filled 2012-09-18: qty 1

## 2012-09-18 MED ORDER — HYDROCODONE-ACETAMINOPHEN 10-500 MG PO TABS: 1.0000 | ORAL_TABLET | Freq: Four times a day (QID) | ORAL | Status: DC | PRN

## 2012-09-18 MED ORDER — HYDROMORPHONE HCL PF 1 MG/ML IJ SOLN
1.0000 mg | Freq: Once | INTRAMUSCULAR | Status: AC
  Administered 2012-09-18: 1 mg via INTRAVENOUS
  Filled 2012-09-18: qty 1

## 2012-09-18 MED ORDER — PROMETHAZINE HCL 25 MG PO TABS: 25.0000 mg | ORAL_TABLET | Freq: Four times a day (QID) | ORAL | Status: DC | PRN

## 2012-09-18 MED ORDER — HYDROMORPHONE HCL PF 1 MG/ML IJ SOLN: 1.0000 mg | Freq: Once | INTRAMUSCULAR | Status: DC

## 2012-09-18 NOTE — ED Notes (Signed)
Pt c/o sickle cell pain to right lower leg that started this morning.pt rates pain at 91/2.VSS.PWD

## 2012-09-19 NOTE — ED Provider Notes (Signed)
History     CSN: [id number]  Arrival date & time 09/18/12  1905   First MD Initiated Contact with Patient 09/18/12 2042      Chief Complaint  Patient presents with  . Sickle Cell Pain Crisis    HPI Pt c/o sickle cell pain to right lower leg that started this morning.pt rates pain at 9/10.  Similar to previous ss attacks.  Past Medical History  Diagnosis Date  . Sickle cell crisis     Past Surgical History  Procedure Date  . Splenectomy     No family history on file.  History  Substance Use Topics  . Smoking status: Never Smoker   . Smokeless tobacco: Never Used  . Alcohol Use: No    OB History    Grav Para Term Preterm Abortions TAB SAB Ect Mult Living                  Review of Systems  All other systems reviewed and are negative.    Allergies  Review of patient's allergies indicates no known allergies.  Home Medications   Current Outpatient Rx  Name  Route  Sig  Dispense  Refill  . VITAMIN D 1000 UNITS PO TABS   Oral   Take 1,000 Units by mouth daily.         . TYLENOL PM EXTRA STRENGTH PO   Oral   Take 2 tablets by mouth as needed. Pain         . VALACYCLOVIR HCL 1 G PO TABS   Oral   Take 1 tablet (1,000 mg total) by mouth 2 (two) times daily.   20 tablet   0   . HYDROCODONE-ACETAMINOPHEN 10-500 MG PO TABS   Oral   Take 1 tablet by mouth every 6 (six) hours as needed for pain.   30 tablet   0   . PROMETHAZINE HCL 25 MG PO TABS   Oral   Take 1 tablet (25 mg total) by mouth every 6 (six) hours as needed for nausea.   30 tablet   0     BP 109/61  Pulse 100  Temp 97.8 F (36.6 C) (Oral)  Resp 18  SpO2 100%  LMP 09/07/2012  Physical Exam  Nursing note and vitals reviewed. Constitutional: She is oriented to person, place, and time. She appears well-developed and well-nourished. No distress.  HENT:  Head: Normocephalic and atraumatic.  Eyes: Pupils are equal, round, and reactive to light.  Neck: Normal range of motion.    Cardiovascular: Normal rate and intact distal pulses.   Pulmonary/Chest: No respiratory distress.  Abdominal: Normal appearance. She exhibits no distension.  Musculoskeletal: Normal range of motion. She exhibits no edema and no tenderness.  Neurological: She is alert and oriented to person, place, and time. No cranial nerve deficit.  Skin: Skin is warm and dry. No rash noted.  Psychiatric: She has a normal mood and affect. Her behavior is normal.    ED Course  Procedures (including critical care time)  Medications  Diphenhydramine-APAP, sleep, (TYLENOL PM EXTRA STRENGTH PO) (not administered)  HYDROcodone-acetaminophen (LORTAB 10) 10-500 MG per tablet (not administered)  promethazine (PHENERGAN) 25 MG tablet (not administered)  HYDROmorphone (DILAUDID) injection 1 mg (1 mg Intravenous Given 09/18/12 2104)  ondansetron (ZOFRAN) injection 4 mg (4 mg Intravenous Given 09/18/12 2104)  fentaNYL (SUBLIMAZE) injection 50 mcg (50 mcg Intravenous Given 09/18/12 2157)  ondansetron (ZOFRAN) injection 4 mg (4 mg Intravenous Given 09/18/12 2157)  fentaNYL (SUBLIMAZE)  injection 50 mcg (50 mcg Intravenous Given 09/18/12 2338)    Labs Reviewed  CBC WITH DIFFERENTIAL - Abnormal; Notable for the following:    WBC 13.2 (*)     RBC 3.31 (*)     Hemoglobin 9.5 (*)     HCT 27.1 (*)     RDW 16.0 (*)     Platelets 611 (*)     Lymphs Abs 5.3 (*)     Monocytes Absolute 1.5 (*)     All other components within normal limits  RETICULOCYTES - Abnormal; Notable for the following:    Retic Ct Pct 11.9 (*)     RBC. 3.31 (*)     Retic Count, Manual 393.9 (*)     All other components within normal limits  COMPREHENSIVE METABOLIC PANEL - Abnormal; Notable for the following:    Total Bilirubin 1.5 (*)     All other components within normal limits  URINALYSIS, ROUTINE W REFLEX MICROSCOPIC - Abnormal; Notable for the following:    Urobilinogen, UA 4.0 (*)     All other components within normal limits   No  results found.   1. Sickle cell crisis       MDM   After treatment in the ED the patient feels back to baseline and wants to go home.        Nelia Shi, MD 09/19/12 [phone]

## 2012-10-08 ENCOUNTER — Encounter (HOSPITAL_COMMUNITY): Payer: Self-pay | Admitting: *Deleted

## 2012-10-08 ENCOUNTER — Emergency Department (HOSPITAL_COMMUNITY)
Admission: EM | Admit: 2012-10-08 | Discharge: 2012-10-08 | Disposition: A | Discharge status: Home / Self Care | Payer: Medicaid Other | Attending: Emergency Medicine | Admitting: Emergency Medicine

## 2012-10-08 DIAGNOSIS — D5700 Hb-SS disease with crisis, unspecified: Secondary | ICD-10-CM

## 2012-10-08 LAB — CBC WITH DIFFERENTIAL/PLATELET
Basophils Absolute: 0.1 10*3/uL (ref 0.0–0.1)
Basophils Relative: 0 % (ref 0–1)
Hemoglobin: 9.5 g/dL — ABNORMAL LOW (ref 12.0–15.0)
MCHC: 35.8 g/dL (ref 30.0–36.0)
Monocytes Absolute: 1.7 10*3/uL — ABNORMAL HIGH (ref 0.1–1.0)
Neutro Abs: 9.5 10*3/uL — ABNORMAL HIGH (ref 1.7–7.7)
Neutrophils Relative %: 57 % (ref 43–77)
Platelets: 566 10*3/uL — ABNORMAL HIGH (ref 150–400)
RBC: 3.26 MIL/uL — ABNORMAL LOW (ref 3.87–5.11)
RDW: 15.8 % — ABNORMAL HIGH (ref 11.5–15.5)

## 2012-10-08 LAB — BASIC METABOLIC PANEL
Chloride: 102 mEq/L (ref 96–112)
Creatinine, Ser: 0.47 mg/dL — ABNORMAL LOW (ref 0.50–1.10)
GFR calc Af Amer: >90 mL/min (ref >90)
Sodium: 137 mEq/L (ref 135–145)

## 2012-10-08 LAB — RETICULOCYTES
RBC.: 3.26 MIL/uL — ABNORMAL LOW (ref 3.87–5.11)
Retic Ct Pct: 10.5 % — ABNORMAL HIGH (ref 0.4–3.1)

## 2012-10-08 MED ORDER — HYDROMORPHONE HCL PF 1 MG/ML IJ SOLN
1.0000 mg | Freq: Once | INTRAMUSCULAR | Status: AC
  Administered 2012-10-08: 1 mg via INTRAMUSCULAR
  Filled 2012-10-08: qty 1

## 2012-10-08 MED ORDER — ONDANSETRON HCL 4 MG/2ML IJ SOLN
4.0000 mg | Freq: Once | INTRAMUSCULAR | Status: AC
  Administered 2012-10-08: 4 mg via INTRAVENOUS
  Filled 2012-10-08: qty 2

## 2012-10-08 MED ORDER — HYDROMORPHONE HCL PF 1 MG/ML IJ SOLN
1.0000 mg | Freq: Once | INTRAMUSCULAR | Status: AC
  Administered 2012-10-08: 1 mg via INTRAVENOUS
  Filled 2012-10-08: qty 1

## 2012-10-08 MED ORDER — SODIUM CHLORIDE 0.9 % IV BOLUS (SEPSIS)
1000.0000 mL | Freq: Once | INTRAVENOUS | Status: AC
  Administered 2012-10-08: 1000 mL via INTRAVENOUS

## 2012-10-08 MED ORDER — OXYCODONE-ACETAMINOPHEN 5-325 MG PO TABS: 2.0000 | ORAL_TABLET | ORAL | Status: DC | PRN

## 2012-10-08 NOTE — ED Notes (Addendum)
Pt alert and oriented, ambulating in triage. From home with c/o sickle cell crisis that began at approximately 1pm. Patient sts it began in her left should and now is in her right as well. Pt rates pain 10/10. Denies taking any pain reliever at home for sickle cell crisis. Sts her MD would not refill her rx.

## 2012-10-08 NOTE — ED Provider Notes (Addendum)
History     CSN: [id number]  Arrival date & time 10/08/12  2440   First MD Initiated Contact with Patient 10/08/12 [phone]      Chief Complaint  Patient presents with  . Sickle Cell Pain Crisis    (Consider location/radiation/quality/duration/timing/severity/associated sxs/prior treatment) HPI Comments: Patient history of sickle cell disease presents with pain consistent with her sickle cell pain crises. She has pain in both of her arms. She denies any chest pain or shortness of breath. Denies any fevers or recent illnesses. Denies any shortness of breath. She states her pain started earlier today and has been worsening throughout the day. She typically takes Lortab 10 mg tablets at home but states that she ran out of her medicine recently. She states that she thinks Dr. August Saucer forgot to prescribe her pain medications on her last visit.  Patient is a 21 y.o. female presenting with sickle cell pain.  Sickle Cell Pain Crisis  Pertinent negatives include no chest pain, no abdominal pain, no diarrhea, no nausea, no vomiting, no hematuria, no congestion, no headaches, no rhinorrhea, no back pain, no weakness, no cough and no rash.    Past Medical History  Diagnosis Date  . Sickle cell crisis     Past Surgical History  Procedure Date  . Splenectomy     No family history on file.  History  Substance Use Topics  . Smoking status: Never Smoker   . Smokeless tobacco: Never Used  . Alcohol Use: No    OB History    Grav Para Term Preterm Abortions TAB SAB Ect Mult Living                  Review of Systems  Constitutional: Negative for fever, chills, diaphoresis and fatigue.  HENT: Negative for congestion, rhinorrhea and sneezing.   Eyes: Negative.   Respiratory: Negative for cough, chest tightness and shortness of breath.   Cardiovascular: Negative for chest pain and leg swelling.  Gastrointestinal: Negative for nausea, vomiting, abdominal pain, diarrhea and blood in stool.   Genitourinary: Negative for frequency, hematuria, flank pain and difficulty urinating.  Musculoskeletal: Positive for arthralgias. Negative for back pain.  Skin: Negative for rash.  Neurological: Negative for dizziness, speech difficulty, weakness, numbness and headaches.    Allergies  Review of patient's allergies indicates no known allergies.  Home Medications   Current Outpatient Rx  Name  Route  Sig  Dispense  Refill  . VITAMIN D 1000 UNITS PO TABS   Oral   Take 1,000 Units by mouth daily.         . TYLENOL PM EXTRA STRENGTH PO   Oral   Take 2 tablets by mouth as needed. Pain           BP 109/51  Pulse 113  Temp 98.3 F (36.8 C) (Oral)  Resp 16  Ht 5\' 4"  (1.626 m)  Wt 180 lb (81.647 kg)  BMI 30.90 kg/m2  SpO2 99%  LMP 09/07/2012  Physical Exam  Constitutional: She is oriented to person, place, and time. She appears well-developed and well-nourished.       Patient is sitting up in bed smiling and using her phone  HENT:  Head: Normocephalic and atraumatic.  Eyes: Pupils are equal, round, and reactive to light.  Neck: Normal range of motion. Neck supple.  Cardiovascular: Normal rate, regular rhythm and normal heart sounds.   Pulmonary/Chest: Effort normal and breath sounds normal. No respiratory distress. She has no wheezes. She has no  rales. She exhibits no tenderness.  Abdominal: Soft. Bowel sounds are normal. There is no tenderness. There is no rebound and no guarding.  Musculoskeletal: Normal range of motion. She exhibits no edema.  Lymphadenopathy:    She has no cervical adenopathy.  Neurological: She is alert and oriented to person, place, and time.  Skin: Skin is warm and dry. No rash noted.  Psychiatric: She has a normal mood and affect.    ED Course  Procedures (including critical care time)  Results for orders placed during the hospital encounter of 10/08/12  CBC WITH DIFFERENTIAL      Component Value Range   WBC 16.6 (*) 4.0 - 10.5 K/uL    RBC 3.26 (*) 3.87 - 5.11 MIL/uL   Hemoglobin 9.5 (*) 12.0 - 15.0 g/dL   HCT 16.1 (*) 09.6 - 04.5 %   MCV 81.3  78.0 - 100.0 fL   MCH 29.1  26.0 - 34.0 pg   MCHC 35.8  30.0 - 36.0 g/dL   RDW 40.9 (*) 81.1 - 91.4 %   Platelets 566 (*) 150 - 400 K/uL   Neutrophils Relative 57  43 - 77 %   Neutro Abs 9.5 (*) 1.7 - 7.7 K/uL   Lymphocytes Relative 32  12 - 46 %   Lymphs Abs 5.3 (*) 0.7 - 4.0 K/uL   Monocytes Relative 10  3 - 12 %   Monocytes Absolute 1.7 (*) 0.1 - 1.0 K/uL   Eosinophils Relative 1  0 - 5 %   Eosinophils Absolute 0.2  0.0 - 0.7 K/uL   Basophils Relative 0  0 - 1 %   Basophils Absolute 0.1  0.0 - 0.1 K/uL  BASIC METABOLIC PANEL      Component Value Range   Sodium 137  135 - 145 mEq/L   Potassium 3.5  3.5 - 5.1 mEq/L   Chloride 102  96 - 112 mEq/L   CO2 23  19 - 32 mEq/L   Glucose, Bld 100 (*) 70 - 99 mg/dL   BUN 9  6 - 23 mg/dL   Creatinine, Ser 7.82 (*) 0.50 - 1.10 mg/dL   Calcium 9.7  8.4 - 95.6 mg/dL   GFR calc non Af Amer >90  >90 mL/min   GFR calc Af Amer >90  >90 mL/min  RETICULOCYTES      Component Value Range   Retic Ct Pct 10.5 (*) 0.4 - 3.1 %   RBC. 3.26 (*) 3.87 - 5.11 MIL/uL   Retic Count, Manual 342.3 (*) 19.0 - 186.0 K/uL   No results found.    1. Sickle cell anemia with pain       MDM  Patient is well-appearing. Her pain is controlled. Labs are at baseline. Was instructed to follow with Dr. August Saucer for ongoing pain management her return here as needed for any worsening symptoms  Pt got very upset and tearful because she thought the nurse was laughing at her.  Had a long discussion about this misunderstanding.  Wants rx for pain meds.  I did give her rx for percocet but advised her that she needs to talk with Dr. August Saucer about prescribing ongoing pain meds for home control of pain as she has recently received scripts for hydrocodone and oxycodone from the ED      Rolan Bucco, MD 10/08/12 2130  Rolan Bucco, MD 10/08/12 [id number]

## 2012-10-08 NOTE — ED Notes (Signed)
MD at bedside. 

## 2012-10-13 ENCOUNTER — Emergency Department (HOSPITAL_COMMUNITY)
Admission: EM | Admit: 2012-10-13 | Discharge: 2012-10-13 | Disposition: A | Discharge status: Home / Self Care | Payer: PRIVATE HEALTH INSURANCE | Attending: Emergency Medicine | Admitting: Emergency Medicine

## 2012-10-13 DIAGNOSIS — IMO0001 Reserved for inherently not codable concepts without codable children: Secondary | ICD-10-CM | POA: Insufficient documentation

## 2012-10-13 DIAGNOSIS — D57419 Sickle-cell thalassemia with crisis, unspecified: Secondary | ICD-10-CM | POA: Insufficient documentation

## 2012-10-13 DIAGNOSIS — M79609 Pain in unspecified limb: Secondary | ICD-10-CM | POA: Insufficient documentation

## 2012-10-13 DIAGNOSIS — D5700 Hb-SS disease with crisis, unspecified: Secondary | ICD-10-CM

## 2012-10-13 DIAGNOSIS — M25569 Pain in unspecified knee: Secondary | ICD-10-CM | POA: Insufficient documentation

## 2012-10-13 LAB — RETICULOCYTES
RBC.: 3.27 MIL/uL — ABNORMAL LOW (ref 3.87–5.11)
Retic Count, Absolute: 444.7 10*3/uL — ABNORMAL HIGH (ref 19.0–186.0)

## 2012-10-13 LAB — URINALYSIS, ROUTINE W REFLEX MICROSCOPIC
Bilirubin Urine: NEGATIVE
Ketones, ur: NEGATIVE mg/dL
Nitrite: NEGATIVE
Specific Gravity, Urine: 1.019 (ref 1.005–1.030)
Urobilinogen, UA: 1 mg/dL (ref 0.0–1.0)

## 2012-10-13 LAB — COMPREHENSIVE METABOLIC PANEL
AST: 34 U/L (ref 0–37)
CO2: 26 mEq/L (ref 19–32)
Calcium: 9.6 mg/dL (ref 8.4–10.5)
Creatinine, Ser: 0.58 mg/dL (ref 0.50–1.10)
GFR calc non Af Amer: >90 mL/min (ref >90)
Total Protein: 8 g/dL (ref 6.0–8.3)

## 2012-10-13 LAB — CBC WITH DIFFERENTIAL/PLATELET
Basophils Absolute: 0.1 10*3/uL (ref 0.0–0.1)
Basophils Relative: 0 % (ref 0–1)
Eosinophils Absolute: 0.2 10*3/uL (ref 0.0–0.7)
Eosinophils Relative: 1 % (ref 0–5)
HCT: 27.2 % — ABNORMAL LOW (ref 36.0–46.0)
Lymphocytes Relative: 43 % (ref 12–46)
MCH: 29.1 pg (ref 26.0–34.0)
MCHC: 34.9 g/dL (ref 30.0–36.0)
MCV: 83.2 fL (ref 78.0–100.0)
Monocytes Absolute: 1.5 10*3/uL — ABNORMAL HIGH (ref 0.1–1.0)
RDW: 16.9 % — ABNORMAL HIGH (ref 11.5–15.5)

## 2012-10-13 MED ORDER — HYDROMORPHONE HCL PF 1 MG/ML IJ SOLN
1.0000 mg | Freq: Once | INTRAMUSCULAR | Status: AC
  Administered 2012-10-13: 1 mg via INTRAVENOUS
  Filled 2012-10-13: qty 1

## 2012-10-13 MED ORDER — OXYCODONE-ACETAMINOPHEN 5-325 MG PO TABS: 1.0000 | ORAL_TABLET | ORAL | Status: DC | PRN

## 2012-10-13 MED ORDER — SODIUM CHLORIDE 0.9 % IV SOLN
Freq: Once | INTRAVENOUS | Status: AC
  Administered 2012-10-13: 06:00:00 via INTRAVENOUS

## 2012-10-13 MED ORDER — KETOROLAC TROMETHAMINE 30 MG/ML IJ SOLN
30.0000 mg | Freq: Once | INTRAMUSCULAR | Status: AC
  Administered 2012-10-13: 30 mg via INTRAVENOUS
  Filled 2012-10-13: qty 1

## 2012-10-13 MED ORDER — HYDROMORPHONE HCL PF 2 MG/ML IJ SOLN
2.0000 mg | Freq: Once | INTRAMUSCULAR | Status: AC
  Administered 2012-10-13: 2 mg via INTRAVENOUS
  Filled 2012-10-13: qty 1

## 2012-10-13 MED ORDER — SODIUM CHLORIDE 0.9 % IV BOLUS (SEPSIS)
1000.0000 mL | Freq: Once | INTRAVENOUS | Status: AC
  Administered 2012-10-13: 1000 mL via INTRAVENOUS

## 2012-10-13 MED ORDER — PROMETHAZINE HCL 25 MG PO TABS: 25.0000 mg | ORAL_TABLET | Freq: Four times a day (QID) | ORAL | Status: DC | PRN

## 2012-10-13 MED ORDER — DIPHENHYDRAMINE HCL 50 MG/ML IJ SOLN
25.0000 mg | Freq: Once | INTRAMUSCULAR | Status: AC
  Administered 2012-10-13: 25 mg via INTRAVENOUS
  Filled 2012-10-13: qty 1

## 2012-10-13 MED ORDER — ONDANSETRON HCL 4 MG/2ML IJ SOLN
4.0000 mg | Freq: Once | INTRAMUSCULAR | Status: AC
  Administered 2012-10-13: 4 mg via INTRAVENOUS
  Filled 2012-10-13: qty 2

## 2012-10-13 NOTE — ED Notes (Signed)
Pt states she is here for sickle cell crisis in her legs. Pt states pain started this morning around 0230 am. Pt currently rates her pain 10/10.

## 2012-10-13 NOTE — ED Provider Notes (Addendum)
21 year old female with a history of sickle cell disease presents with complaint of bilateral lower extremity pain located in the mid thigh bilaterally. She states this came on at 2:30 in the morning, it has been persistent, rated 10 out of 10, dull and aching in nature. She denies back pain, dysuria, fevers chills nausea vomiting cough shortness of breath or chest pain. She was seen recently for similar complaints, she left before complete workup was initiated.  On exam the patient has mildly tender bilateral thighs, no signs of swelling, no dactylitis, clear heart and lungs, soft abdomen, normal mental status and does not appear to be in any significant distress. Her vital signs are reassuring we normal, her blood work shows a slight leukocytosis as well as her reticulocytosis consistent with an acute sickle cell crisis. At this time the patient needs pain medication and nausea medication but is not require any further imaging or workup.  Dilaudid, Zofran, IV fluids  ED ECG REPORT  I personally interpreted this EKG   Date: 10/13/2012   Rate: 90  Rhythm: normal sinus rhythm  QRS Axis: normal  Intervals: normal  ST/T Wave abnormalities: normal  Conduction Disutrbances:none  Narrative Interpretation:   Old EKG Reviewed: Compared with 07/13/2012, no changes  I have reevaluated the patient several times, she is adamant that she wants to go home, I do not see any significant reason that the patient needs to stay other than pain control. She has been given a third dose of Dilaudid and requests discharge with outpatient pain medications. She does appear stable at this time without any hemodynamic instability. Her anemia is at her baseline.  Medical screening examination/treatment/procedure(s) were conducted as a shared visit with non-physician practitioner(s) and myself.  I personally evaluated the patient during the encounter    Vida Roller, MD 10/13/12 8295  Vida Roller, MD 10/13/12  6213  Vida Roller, MD 10/13/12 [phone]

## 2012-10-13 NOTE — ED Provider Notes (Signed)
History     CSN: [id number]  Arrival date & time 10/13/12  8295   First MD Initiated Contact with Patient 10/13/12 0414      Chief Complaint  Patient presents with  . Sickle Cell Pain Crisis    (Consider location/radiation/quality/duration/timing/severity/associated sxs/prior treatment) HPI Carmen [name] is a 21 y.o. female who presents to ED with complaint of pain in bilateral legs. States pain began several days ago, but worsened at 2:30am (2hrs ago). Pt states she was seen for it here several days ago, left without feeling better. States pain in both legs, knees down. Pain is "throbbing, worsened with movement and walking."  Denies fever, chills, URI symptoms, cough, chest pain, urinary symptoms, abdominal pain. Took two tylenol PMs here with no relief.    Past Medical History  Diagnosis Date  . Sickle cell crisis     Past Surgical History  Procedure Date  . Splenectomy     No family history on file.  History  Substance Use Topics  . Smoking status: Never Smoker   . Smokeless tobacco: Never Used  . Alcohol Use: No    OB History    Grav Para Term Preterm Abortions TAB SAB Ect Mult Living                  Review of Systems  Constitutional: Negative for fever and chills.  HENT: Negative for congestion, sore throat, neck pain and neck stiffness.   Eyes: Negative.   Respiratory: Negative.   Cardiovascular: Negative.   Gastrointestinal: Negative for nausea, vomiting and abdominal pain.  Genitourinary: Negative for dysuria, urgency and frequency.  Musculoskeletal: Positive for myalgias and arthralgias.  Skin: Negative for rash.  Neurological: Negative for dizziness, weakness and headaches.  Hematological: Negative.     Allergies  Review of patient's allergies indicates no known allergies.  Home Medications   Current Outpatient Rx  Name  Route  Sig  Dispense  Refill  . VITAMIN D 1000 UNITS PO TABS   Oral   Take 1,000 Units by mouth daily.         .  TYLENOL PM EXTRA STRENGTH PO   Oral   Take 2 tablets by mouth as needed. Pain         . OXYCODONE-ACETAMINOPHEN 5-325 MG PO TABS   Oral   Take 2 tablets by mouth every 4 (four) hours as needed for pain.   15 tablet   0     BP 127/73  Pulse 92  Temp 98.3 F (36.8 C) (Oral)  Resp 16  Ht 5\' 4"  (1.626 m)  Wt 190 lb (86.183 kg)  BMI 32.61 kg/m2  SpO2 97%  LMP 09/07/2012  Physical Exam  Nursing note and vitals reviewed. Constitutional: She is oriented to person, place, and time. She appears well-developed and well-nourished. No distress.  HENT:  Head: Normocephalic and atraumatic.  Eyes: Conjunctivae normal are normal.  Cardiovascular: Normal rate, regular rhythm and normal heart sounds.   Pulmonary/Chest: Effort normal and breath sounds normal. No respiratory distress. She has no wheezes. She has no rales.  Abdominal: Soft. Bowel sounds are normal. She exhibits no distension. There is no tenderness. There is no rebound.  Musculoskeletal:       Normal appearing legs bilaterally with no swelling, bruising, erythema. Tender to palpation over bilateral knees, calves, ankles. Full ROM of knee and ankle joints.   Neurological: She is alert and oriented to person, place, and time.  Skin: Skin is warm and dry.  Psychiatric: She has a normal mood and affect.    ED Course  Procedures (including critical care time)  Pt with typical sickle cell crisis. VS normal. She is afebrile. No signs of infections process. Will check labs, fluids, pain meds ordered.   Results for orders placed during the hospital encounter of 10/13/12  CBC WITH DIFFERENTIAL      Component Value Range   WBC 12.2 (*) 4.0 - 10.5 K/uL   RBC 3.27 (*) 3.87 - 5.11 MIL/uL   Hemoglobin 9.5 (*) 12.0 - 15.0 g/dL   HCT 96.2 (*) 95.2 - 84.1 %   MCV 83.2  78.0 - 100.0 fL   MCH 29.1  26.0 - 34.0 pg   MCHC 34.9  30.0 - 36.0 g/dL   RDW 32.4 (*) 40.1 - 02.7 %   Platelets 571 (*) 150 - 400 K/uL   Neutrophils Relative 43   43 - 77 %   Neutro Abs 5.2  1.7 - 7.7 K/uL   Lymphocytes Relative 43  12 - 46 %   Lymphs Abs 5.3 (*) 0.7 - 4.0 K/uL   Monocytes Relative 12  3 - 12 %   Monocytes Absolute 1.5 (*) 0.1 - 1.0 K/uL   Eosinophils Relative 1  0 - 5 %   Eosinophils Absolute 0.2  0.0 - 0.7 K/uL   Basophils Relative 0  0 - 1 %   Basophils Absolute 0.1  0.0 - 0.1 K/uL  COMPREHENSIVE METABOLIC PANEL      Component Value Range   Sodium 139  135 - 145 mEq/L   Potassium 3.7  3.5 - 5.1 mEq/L   Chloride 103  96 - 112 mEq/L   CO2 26  19 - 32 mEq/L   Glucose, Bld 96  70 - 99 mg/dL   BUN 8  6 - 23 mg/dL   Creatinine, Ser 2.53  0.50 - 1.10 mg/dL   Calcium 9.6  8.4 - 66.4 mg/dL   Total Protein 8.0  6.0 - 8.3 g/dL   Albumin 4.0  3.5 - 5.2 g/dL   AST 34  0 - 37 U/L   ALT 33  0 - 35 U/L   Alkaline Phosphatase 77  39 - 117 U/L   Total Bilirubin 1.5 (*) 0.3 - 1.2 mg/dL   GFR calc non Af Amer >90  >90 mL/min   GFR calc Af Amer >90  >90 mL/min  RETICULOCYTES      Component Value Range   Retic Ct Pct 13.6 (*) 0.4 - 3.1 %   RBC. 3.27 (*) 3.87 - 5.11 MIL/uL   Retic Count, Manual 444.7 (*) 19.0 - 186.0 K/uL  URINALYSIS, ROUTINE W REFLEX MICROSCOPIC      Component Value Range   Color, Urine YELLOW  YELLOW   APPearance CLOUDY (*) CLEAR   Specific Gravity, Urine 1.019  1.005 - 1.030   pH 5.5  5.0 - 8.0   Glucose, UA NEGATIVE  NEGATIVE mg/dL   Hgb urine dipstick MODERATE (*) NEGATIVE   Bilirubin Urine NEGATIVE  NEGATIVE   Ketones, ur NEGATIVE  NEGATIVE mg/dL   Protein, ur NEGATIVE  NEGATIVE mg/dL   Urobilinogen, UA 1.0  0.0 - 1.0 mg/dL   Nitrite NEGATIVE  NEGATIVE   Leukocytes, UA SMALL (*) NEGATIVE  PREGNANCY, URINE      Component Value Range   Preg Test, Ur NEGATIVE  NEGATIVE  URINE MICROSCOPIC-ADD ON      Component Value Range   Squamous Epithelial / LPF FEW (*)  RARE   WBC, UA 0-2  <3 WBC/hpf   RBC / HPF 3-6  <3 RBC/hpf   Bacteria, UA RARE  RARE   No results found.  5:52 AM Pt received 1mg  of dilaudid,  with no improvement, followed by 2mg  of dilaudid. Pt states pain is subsiding but still present. Will continue hydrating and pain management.  Pt signed out at shift change to Dr. Hyacinth Meeker  No diagnosis found.    MDM          Lottie Mussel, PA 10/16/12 [phone]

## 2012-10-16 NOTE — ED Provider Notes (Signed)
  Medical screening examination/treatment/procedure(s) were conducted as a shared visit with non-physician practitioner(s) and myself.  I personally evaluated the patient during the encounter  Please see my separate respective documentation pertaining to this patient encounter   Jayna Mulnix D Itzia Cunliffe, MD 10/16/12 0719 

## 2012-10-29 ENCOUNTER — Encounter (HOSPITAL_COMMUNITY): Payer: Self-pay | Admitting: *Deleted

## 2012-10-29 ENCOUNTER — Emergency Department (HOSPITAL_COMMUNITY)
Admission: EM | Admit: 2012-10-29 | Discharge: 2012-10-29 | Disposition: A | Discharge status: Home / Self Care | Payer: PRIVATE HEALTH INSURANCE | Attending: Emergency Medicine | Admitting: Emergency Medicine

## 2012-10-29 DIAGNOSIS — Z3202 Encounter for pregnancy test, result negative: Secondary | ICD-10-CM | POA: Insufficient documentation

## 2012-10-29 DIAGNOSIS — N390 Urinary tract infection, site not specified: Secondary | ICD-10-CM

## 2012-10-29 DIAGNOSIS — M549 Dorsalgia, unspecified: Secondary | ICD-10-CM | POA: Insufficient documentation

## 2012-10-29 DIAGNOSIS — Z87440 Personal history of urinary (tract) infections: Secondary | ICD-10-CM | POA: Insufficient documentation

## 2012-10-29 DIAGNOSIS — Z862 Personal history of diseases of the blood and blood-forming organs and certain disorders involving the immune mechanism: Secondary | ICD-10-CM | POA: Insufficient documentation

## 2012-10-29 DIAGNOSIS — R300 Dysuria: Secondary | ICD-10-CM | POA: Insufficient documentation

## 2012-10-29 LAB — POCT PREGNANCY, URINE: Preg Test, Ur: NEGATIVE

## 2012-10-29 LAB — URINALYSIS, ROUTINE W REFLEX MICROSCOPIC
Glucose, UA: NEGATIVE mg/dL
pH: 7 (ref 5.0–8.0)

## 2012-10-29 LAB — URINE MICROSCOPIC-ADD ON

## 2012-10-29 MED ORDER — CEPHALEXIN 500 MG PO CAPS
500.0000 mg | ORAL_CAPSULE | Freq: Once | ORAL | Status: AC
  Administered 2012-10-29: 500 mg via ORAL
  Filled 2012-10-29: qty 1

## 2012-10-29 MED ORDER — CEPHALEXIN 500 MG PO CAPS: 500.0000 mg | ORAL_CAPSULE | Freq: Two times a day (BID) | ORAL | Status: DC

## 2012-10-29 MED ORDER — PHENAZOPYRIDINE HCL 200 MG PO TABS: 200.0000 mg | ORAL_TABLET | Freq: Three times a day (TID) | ORAL | Status: DC

## 2012-10-29 NOTE — ED Notes (Signed)
[id number]<AV> Expected date:<BR> Expected time:<BR> Means of arrival:<BR> Comments:<BR> Triage [facility]

## 2012-10-29 NOTE — ED Provider Notes (Signed)
History     CSN: [id number]  Arrival date & time 10/29/12  1620   First MD Initiated Contact with Patient 10/29/12 1838      Chief Complaint  Patient presents with  . Abdominal Pain  . Dysuria    (Consider location/radiation/quality/duration/timing/severity/associated sxs/prior treatment) HPI Comments: 21 y/o with hx of sickle cell anemia comes in with cc of abd pain, dysuria, mild back pain, nausea, polyuria. Pt has had UTI before, with same symptoms. Current symptoms started 2-3 days ago. Not pregnant. No hx of STD, and denies any vaginal discharge, bleeding.   Patient is a 21 y.o. female presenting with abdominal pain and dysuria. The history is provided by the patient.  Abdominal Pain The primary symptoms of the illness include abdominal pain, nausea and dysuria. The primary symptoms of the illness do not include fever, shortness of breath, vomiting, vaginal discharge or vaginal bleeding.  The dysuria is associated with frequency. The dysuria is not associated with hematuria.  Additional symptoms associated with the illness include frequency. Symptoms associated with the illness do not include hematuria.  Dysuria  Associated symptoms include nausea and frequency. Pertinent negatives include no vomiting, no hematuria and no flank pain.    Past Medical History  Diagnosis Date  . Sickle cell crisis     Past Surgical History  Procedure Date  . Splenectomy     History reviewed. No pertinent family history.  History  Substance Use Topics  . Smoking status: Never Smoker   . Smokeless tobacco: Never Used  . Alcohol Use: No    OB History    Grav Para Term Preterm Abortions TAB SAB Ect Mult Living                  Review of Systems  Constitutional: Negative for fever and activity change.  HENT: Negative for neck pain.   Respiratory: Negative for shortness of breath.   Cardiovascular: Negative for chest pain.  Gastrointestinal: Positive for nausea and abdominal  pain. Negative for vomiting.  Genitourinary: Positive for dysuria and frequency. Negative for hematuria, flank pain, vaginal bleeding and vaginal discharge.  Neurological: Negative for headaches.    Allergies  Review of patient's allergies indicates no known allergies.  Home Medications   Current Outpatient Rx  Name  Route  Sig  Dispense  Refill  . VITAMIN D 1000 UNITS PO TABS   Oral   Take 1,000 Units by mouth daily.         Marland Kitchen PROMETHAZINE HCL 25 MG PO TABS   Oral   Take 1 tablet (25 mg total) by mouth every 6 (six) hours as needed for nausea.   12 tablet   0     BP 129/75  Pulse 103  Temp 98.1 F (36.7 C) (Oral)  Resp 16  SpO2 99%  Physical Exam  Nursing note and vitals reviewed. Constitutional: She is oriented to person, place, and time. She appears well-developed and well-nourished.  HENT:  Head: Normocephalic and atraumatic.  Eyes: EOM are normal. Pupils are equal, round, and reactive to light.  Neck: Neck supple.  Cardiovascular: Normal rate, regular rhythm and normal heart sounds.   No murmur heard. Pulmonary/Chest: Effort normal. No respiratory distress.  Abdominal: Soft. She exhibits no distension. There is tenderness. There is no rebound and no guarding.       Suprapubic and lower quadrant tenderness  Genitourinary:       No CVA tenderness  Neurological: She is alert and oriented to person, place,  and time.  Skin: Skin is warm and dry.    ED Course  Procedures (including critical care time)  Labs Reviewed  URINALYSIS, ROUTINE W REFLEX MICROSCOPIC - Abnormal; Notable for the following:    APPearance CLOUDY (*)     Hgb urine dipstick TRACE (*)     Urobilinogen, UA 4.0 (*)     Leukocytes, UA LARGE (*)     All other components within normal limits  POCT PREGNANCY, URINE  URINE MICROSCOPIC-ADD ON  URINE CULTURE   No results found.   No diagnosis found.    MDM  Pt comes in with UTI like symptoms, and has hx of UTI with similar  symptoms. She has hx of sickle cell anemia, but no complains pertaining to that. No flank tenderness, but is having back pain, nausea - so likely pyelonephritis.  No STD hx, and no vaginal discharge, bleeding at this time.    Derwood Kaplan, MD 10/29/12 2107

## 2012-10-29 NOTE — ED Notes (Signed)
Pt reports abdominal pain/ dysuria x3 days. Pain 10/10. Pt has hx of sickle cell, but reports this is not a crisis.

## 2012-10-30 LAB — URINE CULTURE

## 2012-11-16 ENCOUNTER — Telehealth (HOSPITAL_COMMUNITY): Payer: Self-pay | Admitting: Hematology

## 2012-11-16 ENCOUNTER — Emergency Department (HOSPITAL_COMMUNITY)
Admission: EM | Admit: 2012-11-16 | Discharge: 2012-11-16 | Disposition: A | Discharge status: Home / Self Care | Payer: PRIVATE HEALTH INSURANCE | Attending: Emergency Medicine | Admitting: Emergency Medicine

## 2012-11-16 ENCOUNTER — Non-Acute Institutional Stay (HOSPITAL_COMMUNITY)
Admission: AD | Admit: 2012-11-16 | Discharge: 2012-11-16 | Disposition: A | Discharge status: Home / Self Care | Payer: PRIVATE HEALTH INSURANCE | Source: Home / Self Care | Attending: Internal Medicine | Admitting: Internal Medicine

## 2012-11-16 ENCOUNTER — Encounter (HOSPITAL_COMMUNITY): Payer: Self-pay | Admitting: *Deleted

## 2012-11-16 ENCOUNTER — Emergency Department (HOSPITAL_COMMUNITY): Payer: PRIVATE HEALTH INSURANCE

## 2012-11-16 DIAGNOSIS — R05 Cough: Secondary | ICD-10-CM | POA: Insufficient documentation

## 2012-11-16 DIAGNOSIS — Z3202 Encounter for pregnancy test, result negative: Secondary | ICD-10-CM | POA: Insufficient documentation

## 2012-11-16 DIAGNOSIS — R059 Cough, unspecified: Secondary | ICD-10-CM | POA: Insufficient documentation

## 2012-11-16 DIAGNOSIS — Z8701 Personal history of pneumonia (recurrent): Secondary | ICD-10-CM | POA: Insufficient documentation

## 2012-11-16 DIAGNOSIS — Z79899 Other long term (current) drug therapy: Secondary | ICD-10-CM | POA: Insufficient documentation

## 2012-11-16 DIAGNOSIS — R0789 Other chest pain: Secondary | ICD-10-CM | POA: Insufficient documentation

## 2012-11-16 DIAGNOSIS — D5700 Hb-SS disease with crisis, unspecified: Secondary | ICD-10-CM | POA: Insufficient documentation

## 2012-11-16 LAB — RETICULOCYTES
RBC.: 3.08 MIL/uL — ABNORMAL LOW (ref 3.87–5.11)
Retic Count, Absolute: 375.8 10*3/uL — ABNORMAL HIGH (ref 19.0–186.0)

## 2012-11-16 LAB — CBC
HCT: 25.5 % — ABNORMAL LOW (ref 36.0–46.0)
Hemoglobin: 9.2 g/dL — ABNORMAL LOW (ref 12.0–15.0)
RDW: 16.7 % — ABNORMAL HIGH (ref 11.5–15.5)
WBC: 15.1 10*3/uL — ABNORMAL HIGH (ref 4.0–10.5)

## 2012-11-16 LAB — POCT PREGNANCY, URINE: Preg Test, Ur: NEGATIVE

## 2012-11-16 LAB — BASIC METABOLIC PANEL
BUN: 9 mg/dL (ref 6–23)
Chloride: 107 mEq/L (ref 96–112)
Creatinine, Ser: 0.69 mg/dL (ref 0.50–1.10)
GFR calc Af Amer: >90 mL/min (ref >90)
GFR calc non Af Amer: >90 mL/min (ref >90)
Glucose, Bld: 97 mg/dL (ref 70–99)
Potassium: 4.4 mEq/L (ref 3.5–5.1)

## 2012-11-16 MED ORDER — HYDROMORPHONE HCL PF 1 MG/ML IJ SOLN
1.0000 mg | Freq: Once | INTRAMUSCULAR | Status: AC
  Administered 2012-11-16: 1 mg via INTRAVENOUS
  Filled 2012-11-16: qty 1

## 2012-11-16 MED ORDER — HYDROMORPHONE HCL PF 1 MG/ML IJ SOLN
0.5000 mg | Freq: Once | INTRAMUSCULAR | Status: AC
  Administered 2012-11-16: 0.5 mg via INTRAVENOUS
  Filled 2012-11-16: qty 1

## 2012-11-16 MED ORDER — PROMETHAZINE HCL 25 MG/ML IJ SOLN
25.0000 mg | Freq: Once | INTRAMUSCULAR | Status: AC
  Administered 2012-11-16: 25 mg via INTRAMUSCULAR
  Filled 2012-11-16: qty 1

## 2012-11-16 MED ORDER — MORPHINE SULFATE 4 MG/ML IJ SOLN
6.0000 mg | Freq: Once | INTRAMUSCULAR | Status: AC
  Administered 2012-11-16: 6 mg via INTRAVENOUS
  Filled 2012-11-16: qty 2

## 2012-11-16 MED ORDER — SODIUM CHLORIDE 0.9 % IV BOLUS (SEPSIS)
1000.0000 mL | Freq: Once | INTRAVENOUS | Status: AC
  Administered 2012-11-16: 1000 mL via INTRAVENOUS

## 2012-11-16 MED ORDER — KETOROLAC TROMETHAMINE 30 MG/ML IJ SOLN
30.0000 mg | Freq: Once | INTRAMUSCULAR | Status: AC
  Administered 2012-11-16: 30 mg via INTRAVENOUS
  Filled 2012-11-16: qty 1

## 2012-11-16 NOTE — Progress Notes (Signed)
Patient ID: Carmen [name], female   DOB: [birth year], 22 y.o.   MRN: [id number] Pt came to treatment area complaining of severe nausea since being in the ED last night.  Gave Phenergan IM x1 to left buttock without problem. Pt voiced no further complaints.  Pt left ambulating at 1416.

## 2012-11-16 NOTE — Discharge Instructions (Signed)
Follow-up later today with the [facility].  If you experience worsening pain, shortness of breath, or pain associated with fever; go to the [facility] or return to the emergency department for further evaluation.   Sickle Cell Pain Crisis Sickle cell anemia requires regular medical attention by your healthcare provider and awareness about when to seek medical care. Pain is a common problem in children with sickle cell disease. This usually starts at less than 1 year of age. Pain can occur nearly anywhere in the body but most commonly occurs in the extremities, back, chest, or belly (abdomen). Pain episodes can start suddenly or may follow an illness. These attacks can appear as decreased activity, loss of appetite, change in behavior, or simply complaints of pain. DIAGNOSIS   Specialized blood and gene testing can help make this diagnosis early in the disease. Blood tests may then be done to watch blood levels.  Specialized brain scans are done when there are problems in the brain during a crisis.  Lung testing may be done later in the disease. HOME CARE INSTRUCTIONS   Maintain good hydration. Increase you or your child's fluid intake in hot weather and during exercise.  Avoid smoking. Smoking lowers the oxygen in the blood and can cause the production of sickle-shaped cells (sickling).  Control pain. Only take over-the-counter or prescription medicines for pain, discomfort, or fever as directed by your caregiver. Do not give aspirin to children because of the association with Reye's syndrome.  Keep regular health care checks to keep a proper red blood cell (hemoglobin) level. A moderate anemia level protects against sickling crises.  You and your child should receive all the same immunizations and care as the people around you.  Mothers should breastfeed their babies if possible. Use formulas with iron added if breastfeeding is not possible. Additional iron should not be  given unless there is a lack of it. People with sickle cell disease (SCD) build up iron faster than normal. Give folic acid and additional vitamins as directed.  If you or your child has been prescribed antibiotics or other medications to prevent problems, take them as directed.  Summer camps are available for children with SCD. They may help young people deal with their disease. The camps introduce them to other children with the same problem.  Young people with SCD may become frustrated or angry at their disease. This can cause rebellion and refusal to follow medical care. Help groups or counseling may help with these problems.  Wear a medical alert bracelet. When traveling, keep medical information, caregiver's names, and the medications you or your child takes with you at all times. SEEK IMMEDIATE MEDICAL CARE IF:   You or your child develops dizziness or fainting, numbness in or difficulty with movement of arms and legs, difficulty with speech, or is acting abnormally. This could be early signs of a stroke. Immediate treatment is necessary.  You or your child has an oral temperature above 102 F (38.9 C), not controlled by medicine.  You or your child has other signs of infection (chills, lethargy, irritability, poor eating, vomiting). The younger the child, the more you should be concerned.  With fevers, do not give medicine to lower the fever right away. This could cover up a problem that is developing. Notify your caregiver.  You or your child develops pain that is not helped with medicine.  You or your child develops shortness of breath or is coughing up pus-like or bloody sputum.  You or your child develops any problems that are new and are causing you to worry.  You or your child develops a persistent, often uncomfortable and painful penile erection. This is called priapism. Always check young boys for this. It is often embarrassing for them and they may not bring it to your  attention. This is a medical emergency and needs immediate treatment. If this is not treated it will lead to impotence.  You or your child develops a new onset of abdominal pain, especially on the left side near the stomach area.  You or your child has any questions or has problems that are not getting better. Return immediately if you feel your child is getting worse, even if your child was seen only a short while ago. Document Released: 07/28/2005 Document Revised: 01/10/2012 Document Reviewed: 12/17/2009 [facility] Patient Information 2013 [place], [place].

## 2012-11-16 NOTE — ED Provider Notes (Signed)
History     CSN: [id number]  Arrival date & time 11/16/12  0037   First MD Initiated Contact with Patient 11/16/12 0102      No chief complaint on file.   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  She presents for evaluation of back and chest discomfort.  She reports the back pain is consistent with previous sickle-related pain however the chest discomfort is something she had not experienced since having pneumonia some years ago.  She denies fever, rhinorrhea, ST, dyspnea, palpitations, abd pain, NVD, dysuria, and rashes.  She describes sharp, aching lower back pain and a similar aching chest discomfort.  She has been "sneezing a lot".  Over the last several hours she has also developed a nonproductive cough.   Patient is a 22 y.o. female presenting with sickle cell pain. The history is provided by the patient. No language interpreter was used.  Sickle Cell Pain Crisis  This is a new problem. The current episode started 2 days ago. The onset was gradual. The problem occurs continuously. The problem has been gradually worsening. The pain is associated with an unknown factor. Pain location: chest and lower back. Site of pain is localized in bone. The pain is similar to prior episodes. The pain is moderate. Nothing relieves the symptoms. Associated symptoms include chest pain, back pain and cough. Pertinent negatives include no abdominal pain, no diarrhea, no nausea, no vomiting, no dysuria, no hematuria, no vaginal bleeding, no congestion, no ear pain, no headaches, no rhinorrhea, no swollen glands, no joint pain, no neck pain, no neck stiffness, no weakness, no difficulty breathing and no rash. There is no swelling present. She has been behaving normally. She has been eating and drinking normally. Urine output has been normal. The last void occurred less than 6 hours ago. There is no  history of acute chest syndrome. There have been frequent pain crises. There is no history of platelet sequestration. There is no history of stroke. She has not been treated with chronic transfusion therapy. She has not been treated with hydroxyurea. Recently, medical care has been given at this facility. Services received include medications given.    Past Medical History  Diagnosis Date  . Sickle cell crisis     Past Surgical History  Procedure Date  . Splenectomy     No family history on file.  History  Substance Use Topics  . Smoking status: Never Smoker   . Smokeless tobacco: Never Used  . Alcohol Use: No    OB History    Grav Para Term Preterm Abortions TAB SAB Ect Mult Living                  Review of Systems  HENT: Negative for ear pain, congestion, rhinorrhea and neck pain.   Respiratory: Positive for cough.   Cardiovascular: Positive for chest pain.  Gastrointestinal: Negative for nausea, vomiting, abdominal pain and diarrhea.  Genitourinary: Negative for dysuria, hematuria and vaginal bleeding.  Musculoskeletal: Positive for back pain. Negative for joint pain.  Skin: Negative for rash.  Neurological: Negative for weakness and headaches.  All other systems reviewed and are negative.    Allergies  Review of patient's allergies indicates no known allergies.  Home Medications   Current Outpatient Rx  Name  Route  Sig  Dispense  Refill  . VITAMIN D 1000 UNITS PO TABS   Oral   Take 1,000 Units by mouth daily.         Marland Kitchen  DIPHENHYDRAMINE-APAP (SLEEP) 25-500 MG PO TABS   Oral   Take 2 tablets by mouth at bedtime as needed. For sleep and pain           BP 125/76  Pulse 106  Temp 98.8 F (37.1 C) (Oral)  Resp 14  SpO2 99%  LMP 11/16/2012  Physical Exam  Nursing note and vitals reviewed. Constitutional: She is oriented to person, place, and time. She appears well-developed and well-nourished. No distress.  HENT:  Head: Normocephalic and  atraumatic.  Right Ear: External ear normal.  Left Ear: External ear normal.  Nose: Nose normal.  Mouth/Throat: Oropharynx is clear and moist. No oropharyngeal exudate.  Eyes: Conjunctivae normal are normal. Pupils are equal, round, and reactive to light. Right eye exhibits no discharge. Left eye exhibits no discharge. No scleral icterus.       No scleral icterus   Neck: Normal range of motion. Neck supple. No JVD present. No tracheal deviation present.       No nuchal rigidity  Cardiovascular: Normal rate, regular rhythm, normal heart sounds and intact distal pulses.  Exam reveals no gallop and no friction rub.   No murmur heard. Pulmonary/Chest: Effort normal and breath sounds normal. No stridor. No respiratory distress. She has no wheezes. She has no rales. She exhibits no tenderness (no reproducible chest wall pain, retraction, f;ail, or crepitans).  Abdominal: Soft. Bowel sounds are normal. She exhibits no distension and no mass. There is no tenderness. There is no rebound and no guarding.  Musculoskeletal: Normal range of motion. She exhibits tenderness (diffuse lower back tenderness without specific midline pointe tenderness). She exhibits no edema.  Lymphadenopathy:    She has no cervical adenopathy.  Neurological: She is alert and oriented to person, place, and time. No cranial nerve deficit.  Skin: Skin is warm and dry. No rash noted. She is not diaphoretic. No erythema. No pallor.  Psychiatric: She has a normal mood and affect. Her behavior is normal.    ED Course  Procedures (including critical care time)   Labs Reviewed  CBC  BASIC METABOLIC PANEL  RETICULOCYTES   No results found.   No diagnosis found.    MDM  Pt presents for evaluation of back and chest discomfort with known hx of sickle cell dx.  She appears nontoxic, note stable VS, NAD.  Will obtain a CXR and basic labs including reticulocyte count.  Will administer IVF, morphine, and toradol.  Will reassess.   She has a normal respiratory rate and effort with clear breath sounds currently.  5621.  Pt stable, NAD.  She has no clinical evidence of acute chest syndrome.  She appears nontoxic, NAD.  Pain is improved after several doses of morphine/dilaudid.  She does have a leukocytosis.  Plan discharge home.  She will follow-up later today in the sickle cell clinic.  She has been advised to return if the symptoms worsen or fever develops.     Tobin Chad, MD 11/16/12 [phone]

## 2012-11-16 NOTE — ED Notes (Signed)
Pt states that her back began hurting all over about 2 days ago. Stated that it "feels like sickle cell pain." Pt also c/o a stabbing chest pain on the right side that runs under the breast. Denies sob, diaphoresis, fevers, or n/v/d.

## 2012-11-16 NOTE — ED Notes (Signed)
Pt c/o lower back pain; right sided chest pain; symptoms x 2 days; frequent sneezing

## 2012-11-19 ENCOUNTER — Emergency Department (HOSPITAL_COMMUNITY)
Admission: EM | Admit: 2012-11-19 | Discharge: 2012-11-19 | Disposition: A | Discharge status: Home / Self Care | Payer: Medicaid Other | Attending: Emergency Medicine | Admitting: Emergency Medicine

## 2012-11-19 ENCOUNTER — Encounter (HOSPITAL_COMMUNITY): Payer: Self-pay | Admitting: Emergency Medicine

## 2012-11-19 ENCOUNTER — Emergency Department (HOSPITAL_COMMUNITY): Payer: Medicaid Other

## 2012-11-19 DIAGNOSIS — R071 Chest pain on breathing: Secondary | ICD-10-CM | POA: Insufficient documentation

## 2012-11-19 DIAGNOSIS — D5700 Hb-SS disease with crisis, unspecified: Secondary | ICD-10-CM | POA: Insufficient documentation

## 2012-11-19 DIAGNOSIS — R0789 Other chest pain: Secondary | ICD-10-CM

## 2012-11-19 LAB — CBC WITH DIFFERENTIAL/PLATELET
Basophils Relative: 0 % (ref 0–1)
HCT: 26.7 % — ABNORMAL LOW (ref 36.0–46.0)
Hemoglobin: 9.4 g/dL — ABNORMAL LOW (ref 12.0–15.0)
Lymphocytes Relative: 39 % (ref 12–46)
Monocytes Absolute: 1.4 10*3/uL — ABNORMAL HIGH (ref 0.1–1.0)
Monocytes Relative: 9 % (ref 3–12)
Neutro Abs: 7.3 10*3/uL (ref 1.7–7.7)
Neutrophils Relative %: 50 % (ref 43–77)
RBC: 3.18 MIL/uL — ABNORMAL LOW (ref 3.87–5.11)
WBC: 14.6 10*3/uL — ABNORMAL HIGH (ref 4.0–10.5)

## 2012-11-19 LAB — RETICULOCYTES
RBC.: 3.18 MIL/uL — ABNORMAL LOW (ref 3.87–5.11)
Retic Count, Absolute: 407 10*3/uL — ABNORMAL HIGH (ref 19.0–186.0)

## 2012-11-19 LAB — D-DIMER, QUANTITATIVE: D-Dimer, Quant: 0.58 ug/mL-FEU — ABNORMAL HIGH (ref 0.00–0.48)

## 2012-11-19 MED ORDER — OXYCODONE-ACETAMINOPHEN 5-325 MG PO TABS: 2.0000 | ORAL_TABLET | ORAL | Status: DC | PRN

## 2012-11-19 MED ORDER — IOHEXOL 350 MG/ML SOLN
100.0000 mL | Freq: Once | INTRAVENOUS | Status: AC | PRN
  Administered 2012-11-19: 100 mL via INTRAVENOUS

## 2012-11-19 MED ORDER — KETOROLAC TROMETHAMINE 30 MG/ML IJ SOLN
30.0000 mg | Freq: Once | INTRAMUSCULAR | Status: AC
  Administered 2012-11-19: 30 mg via INTRAVENOUS
  Filled 2012-11-19: qty 1

## 2012-11-19 MED ORDER — SODIUM CHLORIDE 0.9 % IV SOLN
Freq: Once | INTRAVENOUS | Status: AC
  Administered 2012-11-19: 01:00:00 via INTRAVENOUS

## 2012-11-19 MED ORDER — HYDROMORPHONE HCL PF 1 MG/ML IJ SOLN
0.5000 mg | INTRAMUSCULAR | Status: DC | PRN
  Administered 2012-11-19 (×2): 0.5 mg via INTRAVENOUS
  Filled 2012-11-19 (×2): qty 1

## 2012-11-19 MED ORDER — ONDANSETRON HCL 4 MG/2ML IJ SOLN
4.0000 mg | Freq: Once | INTRAMUSCULAR | Status: AC
  Administered 2012-11-19: 4 mg via INTRAVENOUS
  Filled 2012-11-19: qty 2

## 2012-11-19 MED ORDER — IBUPROFEN 800 MG PO TABS: 800.0000 mg | ORAL_TABLET | Freq: Three times a day (TID) | ORAL | Status: DC

## 2012-11-19 NOTE — ED Notes (Signed)
Pt alert, nad, c/o "my chest is still bothering me", resp even unlabored, skin pwd, pt currently drinking 32 oz drink and talking on the cell phone.

## 2012-11-19 NOTE — ED Provider Notes (Signed)
History     CSN: [id number]  Arrival date & time 11/19/12  0007   First MD Initiated Contact with Patient 11/19/12 0040      Chief Complaint  Patient presents with  . Sickle Cell Pain Crisis    (Consider location/radiation/quality/duration/timing/severity/associated sxs/prior treatment) HPI 22 -year-old female presents to the emergency apartment complaining of persistent right upper chest and bilateral leg pain. Patient has history of sickle cell disease, and was seen 2 days ago for same. She denies any shortness of breath, no cough no fever. She has had similar complaints before. Patient reports she is on no home pain medications. She is followed by Dr. August Saucer. Patient reports her sickle cell is fairly well controlled and she only has a few flares each year. She denies any palpitations, no leg swelling. She is not on birth control. She does not smoke.  Past Medical History  Diagnosis Date  . Sickle cell crisis     Past Surgical History  Procedure Date  . Splenectomy     No family history on file.  History  Substance Use Topics  . Smoking status: Never Smoker   . Smokeless tobacco: Never Used  . Alcohol Use: No    OB History    Grav Para Term Preterm Abortions TAB SAB Ect Mult Living                  Review of Systems  See History of Present Illness; otherwise all other systems are reviewed and negative  Allergies  Other  Home Medications  No current outpatient prescriptions on file.  BP 127/84  Temp 98.3 F (36.8 C) (Oral)  Resp 18  SpO2 99%  LMP 11/16/2012  Physical Exam  Nursing note and vitals reviewed. Constitutional: She is oriented to person, place, and time. She appears well-developed and well-nourished.  HENT:  Head: Normocephalic and atraumatic.  Nose: Nose normal.  Mouth/Throat: Oropharynx is clear and moist.  Eyes: Conjunctivae normal and EOM are normal. Pupils are equal, round, and reactive to light.  Neck: Normal range of motion. Neck  supple. No JVD present. No tracheal deviation present. No thyromegaly present.  Cardiovascular: Normal rate, regular rhythm, normal heart sounds and intact distal pulses.  Exam reveals no gallop and no friction rub.   No murmur heard. Pulmonary/Chest: Effort normal and breath sounds normal. No stridor. No respiratory distress. She has no wheezes. She has no rales. She exhibits no tenderness.  Abdominal: Soft. Bowel sounds are normal. She exhibits no distension and no mass. There is no tenderness. There is no rebound and no guarding.  Musculoskeletal: Normal range of motion. She exhibits no edema and no tenderness.  Lymphadenopathy:    She has no cervical adenopathy.  Neurological: She is alert and oriented to person, place, and time. She exhibits normal muscle tone. Coordination normal.  Skin: Skin is warm and dry. No rash noted. No erythema. No pallor.  Psychiatric: She has a normal mood and affect. Her behavior is normal. Judgment and thought content normal.    ED Course  Procedures (including critical care time)  Labs Reviewed  CBC WITH DIFFERENTIAL - Abnormal; Notable for the following:    WBC 14.6 (*)     RBC 3.18 (*)     Hemoglobin 9.4 (*)     HCT 26.7 (*)     RDW 17.6 (*)     Platelets 646 (*)     Lymphs Abs 5.6 (*)     Monocytes Absolute 1.4 (*)  All other components within normal limits  RETICULOCYTES - Abnormal; Notable for the following:    Retic Ct Pct 12.8 (*)     RBC. 3.18 (*)     Retic Count, Manual 407.0 (*)     All other components within normal limits  D-DIMER, QUANTITATIVE - Abnormal; Notable for the following:    D-Dimer, Quant 0.58 (*)     All other components within normal limits   Ct Angio Chest Pe W/cm &/or Wo Cm  11/19/2012  *RADIOLOGY REPORT*  Clinical Data: Back and chest pain.  Sickle cell.  Nausea.  CT ANGIOGRAPHY CHEST  Technique:  Multidetector CT imaging of the chest using the standard protocol during bolus administration of intravenous  contrast. Multiplanar reconstructed images including MIPs were obtained and reviewed to evaluate the vascular anatomy.  Contrast: OMNIPAQUE IOHEXOL 350 MG/ML SOLN  Comparison: 01/11/2012  Findings: Technically adequate study with moderately good opacification of the central and segmental pulmonary arteries.  No discrete filling defects identified.  No evidence of significant pulmonary embolus.  Normal heart size.  Normal caliber thoracic aorta.  Esophagus is decompressed.  Increased density in the anterior mediastinum is stable since previous study.  This could represent thymus, extramedullary no wheezes, or coalescing lymph nodes.  No other evidence of significant lymphadenopathy in the chest.  Visualized portions of the upper abdominal organs are grossly unremarkable.  The spleen is atrophic.  Mild dependent changes in the lungs.  Scarring in the right lung base is stable.  No focal airspace consolidation.  No significant interstitial changes.  No pneumothorax.  No pleural effusions. Mild vertebral endplate spurring and sclerosis consistent with changes of sickle cell.  IMPRESSION: Stable appearance of the chest since previous study.  No evidence of acute pulmonary embolus.   Original Report Authenticated By: Burman Nieves, M.D.      1. Sickle cell disease with crisis   2. Chest wall pain       MDM  22 year old female with pain that seems consistent with a sickle cell crisis. We'll check d-dimer for possible PE though I think this is low probability. We'll give pain medicine fluids and reassess        Olivia Mackie, MD 11/19/12 [phone]

## 2012-11-19 NOTE — ED Notes (Signed)
Pt c/o pain in legs,back and chest related to sickle cell.

## 2012-11-19 NOTE — ED Notes (Signed)
Patient is alert and oriented x3.  She was given DC instructions and follow up visit instructions.  Patient gave verbal understanding. She was DC ambulatory under her own power to home.  V/S stable.  sHe was not showing any signs of distress on DC 

## 2012-11-27 ENCOUNTER — Encounter (HOSPITAL_COMMUNITY): Payer: Self-pay | Admitting: *Deleted

## 2012-11-27 ENCOUNTER — Emergency Department (HOSPITAL_COMMUNITY): Payer: Medicaid Other

## 2012-11-27 ENCOUNTER — Non-Acute Institutional Stay (HOSPITAL_COMMUNITY)
Admission: EM | Admit: 2012-11-27 | Discharge: 2012-11-28 | Disposition: A | Discharge status: Home / Self Care | Payer: Medicaid Other | Attending: Emergency Medicine | Admitting: Emergency Medicine

## 2012-11-27 DIAGNOSIS — M79609 Pain in unspecified limb: Secondary | ICD-10-CM

## 2012-11-27 DIAGNOSIS — D72829 Elevated white blood cell count, unspecified: Secondary | ICD-10-CM | POA: Insufficient documentation

## 2012-11-27 DIAGNOSIS — G8929 Other chronic pain: Secondary | ICD-10-CM | POA: Insufficient documentation

## 2012-11-27 DIAGNOSIS — R52 Pain, unspecified: Secondary | ICD-10-CM | POA: Insufficient documentation

## 2012-11-27 DIAGNOSIS — J309 Allergic rhinitis, unspecified: Secondary | ICD-10-CM | POA: Insufficient documentation

## 2012-11-27 DIAGNOSIS — R112 Nausea with vomiting, unspecified: Secondary | ICD-10-CM | POA: Insufficient documentation

## 2012-11-27 DIAGNOSIS — D5700 Hb-SS disease with crisis, unspecified: Secondary | ICD-10-CM | POA: Insufficient documentation

## 2012-11-27 LAB — CBC
HCT: 25.9 % — ABNORMAL LOW (ref 36.0–46.0)
Hemoglobin: 9.8 g/dL — ABNORMAL LOW (ref 12.0–15.0)
Hemoglobin: 8.9 g/dL — ABNORMAL LOW (ref 12.0–15.0)
MCH: 29.4 pg (ref 26.0–34.0)
MCHC: 35.1 g/dL (ref 30.0–36.0)
MCV: 83 fL (ref 78.0–100.0)
Platelets: 608 10*3/uL — ABNORMAL HIGH (ref 150–400)
RBC: 3.33 MIL/uL — ABNORMAL LOW (ref 3.87–5.11)
RBC: 3.12 MIL/uL — ABNORMAL LOW (ref 3.87–5.11)
WBC: 15.4 10*3/uL — ABNORMAL HIGH (ref 4.0–10.5)

## 2012-11-27 LAB — BASIC METABOLIC PANEL
BUN: 7 mg/dL (ref 6–23)
Calcium: 9.5 mg/dL (ref 8.4–10.5)
GFR calc Af Amer: >90 mL/min (ref >90)
GFR calc non Af Amer: >90 mL/min (ref >90)
Glucose, Bld: 115 mg/dL — ABNORMAL HIGH (ref 70–99)
Potassium: 3.5 mEq/L (ref 3.5–5.1)
Sodium: 138 mEq/L (ref 135–145)

## 2012-11-27 LAB — RETICULOCYTES
RBC.: 3.33 MIL/uL — ABNORMAL LOW (ref 3.87–5.11)
Retic Ct Pct: 14.7 % — ABNORMAL HIGH (ref 0.4–3.1)

## 2012-11-27 MED ORDER — DEXTROSE-NACL 5-0.45 % IV SOLN
INTRAVENOUS | Status: DC
  Administered 2012-11-27 (×2): via INTRAVENOUS

## 2012-11-27 MED ORDER — ONDANSETRON HCL 4 MG PO TABS: 4.0000 mg | ORAL_TABLET | ORAL | Status: DC | PRN

## 2012-11-27 MED ORDER — ONDANSETRON HCL 4 MG/2ML IJ SOLN
4.0000 mg | Freq: Once | INTRAMUSCULAR | Status: AC
  Administered 2012-11-27: 4 mg via INTRAVENOUS
  Filled 2012-11-27: qty 2

## 2012-11-27 MED ORDER — ONDANSETRON HCL 4 MG/2ML IJ SOLN
4.0000 mg | Freq: Once | INTRAMUSCULAR | Status: AC
  Administered 2012-11-27: 4 mg via INTRAVENOUS

## 2012-11-27 MED ORDER — HYDROMORPHONE HCL PF 2 MG/ML IJ SOLN
2.0000 mg | Freq: Once | INTRAMUSCULAR | Status: AC
  Administered 2012-11-27: 2 mg via INTRAVENOUS
  Filled 2012-11-27: qty 1

## 2012-11-27 MED ORDER — HYDROMORPHONE HCL PF 1 MG/ML IJ SOLN
1.0000 mg | Freq: Once | INTRAMUSCULAR | Status: AC
  Administered 2012-11-27: 1 mg via INTRAVENOUS
  Filled 2012-11-27: qty 1

## 2012-11-27 MED ORDER — LACTULOSE 10 GM/15ML PO SOLN: 20.0000 g | Freq: Two times a day (BID) | ORAL | Status: DC | PRN

## 2012-11-27 MED ORDER — PROMETHAZINE HCL 25 MG/ML IJ SOLN
12.5000 mg | Freq: Once | INTRAMUSCULAR | Status: AC
  Administered 2012-11-27: 12.5 mg via INTRAVENOUS
  Filled 2012-11-27: qty 1

## 2012-11-27 MED ORDER — KETOROLAC TROMETHAMINE 30 MG/ML IJ SOLN
30.0000 mg | Freq: Four times a day (QID) | INTRAMUSCULAR | Status: DC
  Administered 2012-11-27 (×2): 30 mg via INTRAVENOUS
  Filled 2012-11-27 (×2): qty 1

## 2012-11-27 MED ORDER — DIPHENHYDRAMINE HCL 50 MG/ML IJ SOLN: 12.5000 mg | INTRAMUSCULAR | Status: DC | PRN

## 2012-11-27 MED ORDER — ENOXAPARIN SODIUM 40 MG/0.4ML ~~LOC~~ SOLN
40.0000 mg | SUBCUTANEOUS | Status: DC
  Administered 2012-11-27: 40 mg via SUBCUTANEOUS
  Filled 2012-11-27: qty 0.4

## 2012-11-27 MED ORDER — FOLIC ACID 1 MG PO TABS
1.0000 mg | ORAL_TABLET | Freq: Every day | ORAL | Status: DC
  Administered 2012-11-27: 1 mg via ORAL
  Filled 2012-11-27: qty 1

## 2012-11-27 MED ORDER — KETOROLAC TROMETHAMINE 30 MG/ML IJ SOLN
30.0000 mg | Freq: Once | INTRAMUSCULAR | Status: AC
  Administered 2012-11-27: 30 mg via INTRAVENOUS
  Filled 2012-11-27: qty 1

## 2012-11-27 MED ORDER — ONDANSETRON HCL 4 MG/2ML IJ SOLN
INTRAMUSCULAR | Status: AC
  Administered 2012-11-27: 4 mg via INTRAVENOUS
  Filled 2012-11-27: qty 2

## 2012-11-27 MED ORDER — DIPHENHYDRAMINE HCL 25 MG PO CAPS: 25.0000 mg | ORAL_CAPSULE | ORAL | Status: DC | PRN

## 2012-11-27 MED ORDER — ONDANSETRON HCL 4 MG/2ML IJ SOLN
4.0000 mg | INTRAMUSCULAR | Status: DC | PRN
  Administered 2012-11-27 (×2): 4 mg via INTRAVENOUS
  Filled 2012-11-27 (×2): qty 2

## 2012-11-27 MED ORDER — HYDROMORPHONE HCL PF 2 MG/ML IJ SOLN
2.0000 mg | INTRAMUSCULAR | Status: DC | PRN
  Administered 2012-11-27: 3 mg via INTRAVENOUS
  Administered 2012-11-27: 2 mg via INTRAVENOUS
  Administered 2012-11-27: 4 mg via INTRAVENOUS
  Administered 2012-11-27 (×2): 2 mg via INTRAVENOUS
  Filled 2012-11-27 (×2): qty 1
  Filled 2012-11-27: qty 2
  Filled 2012-11-27: qty 1
  Filled 2012-11-27: qty 2

## 2012-11-27 MED ORDER — SENNOSIDES-DOCUSATE SODIUM 8.6-50 MG PO TABS: 1.0000 | ORAL_TABLET | Freq: Two times a day (BID) | ORAL | Status: DC

## 2012-11-27 NOTE — ED Notes (Signed)
Pt presents to the ED with a complaint of sickle cell crisis.  Pt states she is having pain in the left arm and the right leg.  Pt is a pt of the sickle cell center.

## 2012-11-27 NOTE — ED Provider Notes (Signed)
Medical screening examination/treatment/procedure(s) were performed by non-physician practitioner and as supervising physician I was immediately available for consultation/collaboration.  Sunnie Nielsen, MD 11/27/12 2340

## 2012-11-27 NOTE — Discharge Summary (Signed)
Physician Discharge Summary  Patient ID: Carmen [name] MRN: [id number] DOB/AGE: 02-06-1991 22 y.o.  Admit date: 11/27/2012 Discharge date: 11/27/2012  Admission Diagnoses: sickle cell anemia with crisis  Discharge Diagnoses: same  Discharged Condition: good  Hospital Course: Pt admitted to OBS in [facility] today 2/2 typical sickle cell crisis pain in arm and leg. She states her pain on admission was a 9/10. Her normal pain at home is a 0/10-1/10 and she is only on Tylenol at home. She was managed with IV pain meds, antiemetics and IVF for hydration. She had 3 episodes of emesis but none in hours and she has had no abd pain or vomiting. Afebrile. CXR in ED clear. WBCC slightly high on admission and continues to be at discharge, but she has had a splenectomy and no signs of infection here. Her Hgb has decreased slightly since admission which I would attribute to dilution from IVF.  Although, she hasn't stayed 23 hours, she is asking to go home. She states her pain is now a 0/10-1/10 which is her goal. She denies any n/v, abd pain, diarrhea, chest pain, cough or cold sx.   Significant Diagnostic Studies: Labs and CXR reviewed.   Discharge Exam: Blood pressure 121/60, pulse 91, temperature 98.5 F (36.9 C), temperature source Oral, resp. rate 16, height 5\' 4"  (1.626 m), last menstrual period 11/16/2012, SpO2 99.00%. GEN: 22 yo AAF lying in bed in NAD. Appears well.  CARD: S1S2 without MRG. RRR. RESP: CTA, normal effort ABD: soft, ND, NT PSYCH: alert and oriented, pleasant and cooperative.  Disposition: 01-Home or Self Care     Medication List     As of 11/27/2012 11:07 PM    TAKE these medications         acetaminophen 500 MG tablet   Commonly known as: TYLENOL   Take 1,000 mg by mouth every 6 (six) hours as needed. pain      cholecalciferol 1000 UNITS tablet   Commonly known as: VITAMIN D   Take 1,000 Units by mouth daily.           Follow-up Information    Follow up with  August Saucer, ERIC, MD. Call in 1 day. (call to see if need to be seen earlier)    Contact information:   [address] [place] [address] [phone]          Signed: Jimmye Norman 11/27/2012, 11:07 PM

## 2012-11-27 NOTE — Progress Notes (Signed)
*  Preliminary Results* Right lower extremity venous duplex completed. Right lower extremity is negative for deep vein thrombosis. No evidence of right Baker's cyst. Preliminary results discussed with Aram Beecham, RN.  11/27/2012 2:26 PM Gertie Fey, RDMS, RDCS

## 2012-11-27 NOTE — ED Notes (Signed)
Pt in c/o sickle cell crisis, c/o pain to right leg and left arm, denies fever or shortness of breath, states this is typical of her crisis.

## 2012-11-27 NOTE — H&P (Signed)
[facility] History and Physical   Date: 11/27/2012  Patient name: Carmen [name] Medical record number: [id number] Date of birth: 10-31-1991 Age: 22 y.o. Gender: female PCP: August Saucer, ERIC, MD  Attending physician:  Dr. Marthann Schiller  Chief Complaint: right lower extremity, and left forearm pain consistent with prior Sickle Cell Pain Crisis  History of Present Illness: 22 yr old AA female with reports hemoglobin SS Sickle Cell disease, who presented to the ED with a 1 week progressively increasing pain to her right lower leg and left forearm consistent with her typical crisis pain. Of note, she has been seen in the ED x 8 visits within past 6 months and only takes Tylenol OTC at home prn pain. She reports this regimen to be adequate with her normal pain level 1/10 without crisis and 4/10 during a crisis. She reports decreased weight bearing and the need to "hobble" as pain radiates to the right ankle. She currently rates her pain 10/10 in the ED and 9/10 now following treatment. She reports "feeling hot and cold since this am but denies having a fever".  She reports allergy symptoms (sniffing, sneezing, rhinorrhea), and nausea without associated vomiting, abdominal pain, or cramping. she denies headache, dizziness, blurred vision, chest pain, SOB, palpitations, vomiting, pelvic pain, vaginal discharge, dysuria, hematuria, urinary urgency, or frequency. He last BM last night. LMP 11/16/12 and last crisis 3 months ago with epic records identifying recent treatment [facility] on 11/16/12.  Meds: Prescriptions prior to admission  Medication Sig Dispense Refill  . acetaminophen (TYLENOL) 500 MG tablet Take 1,000 mg by mouth every 6 (six) hours as needed. pain      . cholecalciferol (VITAMIN D) 1000 UNITS tablet Take 1,000 Units by mouth daily.        Allergies: Other Past Medical History  Diagnosis Date  . Sickle cell crisis    Past Surgical History  Procedure Date  . Splenectomy   .  Splenectomy, total    History reviewed. No pertinent family history. History   Social History  . Marital Status: Single    Spouse Name: N/A    Number of Children: N/A  . Years of Education: N/A   Occupational History  . Not on file.   Social History Main Topics  . Smoking status: Never Smoker   . Smokeless tobacco: Never Used  . Alcohol Use: No  . Drug Use: No  . Sexually Active: Yes    Birth Control/ Protection: None   Other Topics Concern  . Not on file   Social History Narrative  . No narrative on file    Review of Systems: As noted in HPI: otherwise negative.  Physical Exam: Blood pressure 107/79, pulse 93, temperature 97.9 F (36.6 C), temperature source Oral, resp. rate 20, height 5\' 4"  (1.626 m), last menstrual period 11/16/2012, SpO2 100.00%. BP 107/79  Pulse 93  Temp 97.9 F (36.6 C) (Oral)  Resp 20  Ht 5\' 4"  (1.626 m)  SpO2 100%  LMP 11/16/2012  General Appearance:    Alert, cooperative, no distress, appears stated age  Head:    Normocephalic, without obvious abnormality, atraumatic  Eyes:    PERRL, conjunctiva/corneas clear, EOM's intact.  Ears:    Right ear with cerumen impaction, left TM's intact, no effusions, external ears intact negative pain with tension to tragus  Nose:   Nares normal, bilateral swollen turbinates; septum midline, no drainage or sinus tenderness  Throat:   Lips, mucosa, and tongue normal; teeth and gums  normal  Neck:   Supple, symmetrical, trachea midline, no adenopathy;    thyroid:  no enlargement/tenderness/nodules; no carotid   bruit or JVD  Back:     Symmetric, no curvature, ROM normal, no CVA tenderness  Lungs:     Diminished bases; otherwise clear to auscultation bilaterally, respirations unlabored  Chest Wall:    No tenderness or deformity   Heart:    Regular rate and rhythm, S1 and S2 normal, no murmur, rub   or gallop  Breast Exam:    Deferred   Abdomen:     Soft, slightly obese, non-tender, bowel sounds active all  four quadrants, no masses, no organomegaly  Genitalia:    Deferred   Rectal:    Deferred  Extremities:   Extremities normal, atraumatic, no cyanosis or edema  Pulses:   2+ and symmetric all extremities; Increased pain with right foot flexion  Skin:   Skin color, texture, turgor normal, no rashes or lesions  Lymph nodes:   Cervical, supraclavicular, and axillary nodes normal  Neurologic:   CNII-XII intact, normal strength, sensation and reflexes    throughout   Lab results: Results for orders placed during the hospital encounter of 11/27/12 (from the past 24 hour(s))  CBC     Status: Abnormal   Collection Time   11/27/12  2:40 AM      Component Value Range   WBC 12.5 (*) 4.0 - 10.5 K/uL   RBC 3.33 (*) 3.87 - 5.11 MIL/uL   Hemoglobin 9.8 (*) 12.0 - 15.0 g/dL   HCT 16.1 (*) 09.6 - 04.5 %   MCV 83.8  78.0 - 100.0 fL   MCH 29.4  26.0 - 34.0 pg   MCHC 35.1  30.0 - 36.0 g/dL   RDW 40.9 (*) 81.1 - 91.4 %   Platelets 608 (*) 150 - 400 K/uL  RETICULOCYTES     Status: Abnormal   Collection Time   11/27/12  2:40 AM      Component Value Range   Retic Ct Pct 14.7 (*) 0.4 - 3.1 %   RBC. 3.33 (*) 3.87 - 5.11 MIL/uL   Retic Count, Manual 489.5 (*) 19.0 - 186.0 K/uL  BASIC METABOLIC PANEL     Status: Abnormal   Collection Time   11/27/12  2:40 AM      Component Value Range   Sodium 138  135 - 145 mEq/L   Potassium 3.5  3.5 - 5.1 mEq/L   Chloride 103  96 - 112 mEq/L   CO2 27  19 - 32 mEq/L   Glucose, Bld 115 (*) 70 - 99 mg/dL   BUN 7  6 - 23 mg/dL   Creatinine, Ser 7.82  0.50 - 1.10 mg/dL   Calcium 9.5  8.4 - 95.6 mg/dL   GFR calc non Af Amer >90  >90 mL/min   GFR calc Af Amer >90  >90 mL/min    Imaging results:  Dg Chest 2 View  11/27/2012  *RADIOLOGY REPORT*  Clinical Data: Sickle cell crisis  CHEST - 2 VIEW  Comparison: 11/19/2012 CT  Findings: Lungs are clear. No pleural effusion or pneumothorax. The cardiomediastinal contours are within normal limits.  Mild multilevel endplate  concavity, in keeping with osseous changes of sickle cell disease.  IMPRESSION: No radiographic evidence of acute cardiopulmonary process   Original Report Authenticated By: Jearld Lesch, M.D.      Assessment & Plan: 1) Sickle Cell Pain Crisis- elevated Retic 14.7%, T. Bili 1.5 on 10/13/12;  she reports SS Disease; no Hgb electrophoresis in our records; treat with gently IV hydration, prn anti-emetic, anti-pruretic and bowel management regimen with Lovenox DVT prophylaxis; on folic acid daily; ? Hydrea; follow 2) Leukocytosis- Mild with WBC 12.5; history total splenectomy; afebrile (T. Max 98.1);Toradol 30 mg IV every 6 hrs; CXR negative infection in ED; follow up with am labs 3) Left lower leg/calf pain- D-Dimer 0.58 on 11/19/12; obtain duplex evaluation to rule out DVT; follow  4) Acute on Chronic Pain secondary to SCD- IV dilaudid 2-4 mg scheduled x 24 hrs then prn; re-evaluate and follow 5) Anemia of Sickle Cell Disease- Hgb 9.8; re-evaluate post hydration with am labs 6) Nausea/Vomiting- vomited x 1 since admitted; Zofran every 4 prn and IV Dilaudid causes nausea; will give phenergan 12.5 mg IV x 1 dose now; follow  7) Allergic Rhinitis- recommend OTC Claritin 8) Disposition- 23 hours OBS; Rx Dilaudid 4 mg every 4 hrs prn pain control when discharged; f/u with Dr. August Saucer within 2 weeks and will need a Hgb electrophoresis  We have conferred with Dr. Marthann Schiller regarding the plan of care for this patient  Lizbeth Bark Pager [phone] 11/27/2012, 9:49 AM

## 2012-11-27 NOTE — ED Provider Notes (Signed)
History     CSN: [id number]  Arrival date & time 11/27/12  0129   First MD Initiated Contact with Patient 11/27/12 0138      Chief Complaint  Patient presents with  . Sickle Cell Pain Crisis    (Consider location/radiation/quality/duration/timing/severity/associated sxs/prior treatment) HPI  Pt to the ED for sickle cell crisis. 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 She admits to having the crisis for a few days now and trying to control it with her at home meds. Her pain is all over but worse in her back, arms and legs which she describes as a normal crisis. She tells me that she does not feel that she needs to be admitted but her pain is so intense she couldn't control it at home. She denies SOB, CP, wheezing. Pts VSS and NAD  Past Medical History  Diagnosis Date  . Sickle cell crisis     Past Surgical History  Procedure Date  . Splenectomy     History reviewed. No pertinent family history.  History  Substance Use Topics  . Smoking status: Never Smoker   . Smokeless tobacco: Never Used  . Alcohol Use: No    OB History    Grav Para Term Preterm Abortions TAB SAB Ect Mult Living                  Review of Systems  All other systems reviewed and are negative.    Allergies  Other  Home Medications   Current Outpatient Rx  Name  Route  Sig  Dispense  Refill  . ACETAMINOPHEN 500 MG PO TABS   Oral   Take 1,000 mg by mouth every 6 (six) hours as needed. pain         . VITAMIN D3 1000 UNITS PO TABS   Oral   Take 1,000 Units by mouth daily.           BP 138/87  Pulse 96  Temp 98.1 F (36.7 C) (Oral)  Resp 20  SpO2 100%  LMP 11/16/2012  Physical Exam  Nursing note and vitals reviewed. Constitutional: She appears well-developed and well-nourished. No distress.  HENT:  Head: Normocephalic and atraumatic.  Eyes: Pupils are equal, round, and reactive to light.  Neck: Normal range of motion. Neck supple.  Cardiovascular: Normal rate and  regular rhythm.   Pulmonary/Chest: Effort normal.  Abdominal: Soft.  Neurological: She is alert.  Skin: Skin is warm and dry.    ED Course  Procedures (including critical care time)  Labs Reviewed  CBC - Abnormal; Notable for the following:    WBC 12.5 (*)     RBC 3.33 (*)     Hemoglobin 9.8 (*)     HCT 27.9 (*)     RDW 17.4 (*)     Platelets 608 (*)     All other components within normal limits  RETICULOCYTES - Abnormal; Notable for the following:    Retic Ct Pct 14.7 (*)     RBC. 3.33 (*)     Retic Count, Manual 489.5 (*)     All other components within normal limits  BASIC METABOLIC PANEL - Abnormal; Notable for the following:    Glucose, Bld 115 (*)     All other components within normal limits   Dg Chest 2 View  11/27/2012  *RADIOLOGY REPORT*  Clinical Data: Sickle cell crisis  CHEST - 2 VIEW  Comparison: 11/19/2012 CT  Findings: Lungs are clear. No pleural  effusion or pneumothorax. The cardiomediastinal contours are within normal limits.  Mild multilevel endplate concavity, in keeping with osseous changes of sickle cell disease.  IMPRESSION: No radiographic evidence of acute cardiopulmonary process   Original Report Authenticated By: Jearld Lesch, M.D.      No diagnosis found. Dx: sickle cell crisis   MDM  Pt given 1 mg IV Dilaudid which   Patients xray and labs are not acute. Her reticulocytes are elevated at this time.  Pt given 2mg  IV Dilaudid and still has pain 6/10. She wants to try to  Go home. Will give one more round of pain medication at 6:3 0am and if pain does not get better she may need to be admitted.  PT complaining of left calf hurting still. She says that this normally happens. Pain in one of the other leg is the last to go. She denies that this is unusual.  End of shift care handed off to oncoming PA, Remi Haggard.        Dorthula Matas, PA 11/27/12 0530

## 2012-11-27 NOTE — H&P (Signed)
PT seen and examined and discussed with NP Vilinda Blanks. The pain in the right calf in light of a recent elevated D-dimer gives reasonable indication to evaluate for DVT. IT is noted that the CT angiogram at that time was negative for PE. Also note that the patient was recently seen by NP Gwinda Passe in the clinic and under supervision of Dr. August Saucer received Phenergran

## 2012-11-27 NOTE — ED Notes (Signed)
Pt has now become arouseable. Blood pressure has increased and pt requested a ginger ale which was provided.

## 2012-11-27 NOTE — ED Provider Notes (Signed)
0600 Report received from Tiffany PA for this 22 yo female with her usual sickle cell pain to her RLE.  She will receive one more dose of dilaudid and we will call the clinic and have her transferred or admit to hospital.  Dr. August Saucer is her doctor but she has never been to the sickle cell clinic.  8am  patient will go to the sickle cell clinic by wheelchair as a transfer. She received 3 doses of Dilaudid in the ER but still having pain 8/10 to her right lower extremity. I spoke with Dr. Ashley Royalty and the nurse practitioner that will be caring for her at the clinic. Patient is stable for transfer.   Remi Haggard, NP 11/28/12 [phone]

## 2012-11-27 NOTE — Progress Notes (Signed)
Patient has vomit x 3, NP notified with orders., clear in color.

## 2012-11-27 NOTE — ED Notes (Signed)
Pt c/o nausea and pain 9/10. Thurston Hole, ED NP made aware. Plan of care to admit to sickle cell center discussed with pt. Pt verbalizes understanding and agreement. Will continue to monitor.

## 2012-11-27 NOTE — Progress Notes (Signed)
Patient ID: Carmen [name], female   DOB: 10/26/1991, 22 y.o.   MRN: [id number] Patient arrive to the sickle cell medical unit via w/c. Vitals stable. 20g right upper forearm, patient c/o pain in left arm and right leg., pain goal is 2 before discharge., patient on 2/liters O2 [place].Marland Kitchen Patient comfortable at this note.

## 2012-11-27 NOTE — ED Notes (Signed)
Pt placed on 2L O2 [address]. Pt sleeping, O2 sats drop to mid 80s. Returns to 100% by [place].

## 2012-11-27 NOTE — Progress Notes (Signed)
Patient reports that she wants to go home because she has reached her goal. Rn informed patient of process of calling hospitalist and having them to come see her prior to discharge. Patient states that she understands. Rn started discharge process by calling [facility] and has talked with NP who will be discharging patient pending labs.

## 2012-11-28 NOTE — ED Provider Notes (Signed)
Medical screening examination/treatment/procedure(s) were performed by non-physician practitioner and as supervising physician I was immediately available for consultation/collaboration.  Sunnie Nielsen, MD 11/28/12 2258

## 2012-11-28 NOTE — Discharge Summary (Signed)
Pt seen and examined.  Agree with above.  D/c over 30 minutes total time.

## 2012-12-13 NOTE — Discharge Summary (Signed)
Carmen [name] presented to unit for medication only. She had been evaluated and treated in the ED. She was released but was still experiencing nausea. She was seen in the medicine clinic and sent to the unit for IM phenergan. Patient was given Phenergan 25 mg IM in the left buttock per nursing documentation. She tolerated the procedure well and was sent home thereafter.

## 2012-12-13 NOTE — H&P (Signed)
Carmen [name] presented to the unit for medication only. She had been evaluated and treated in the ED. She was released but still experiencing nausea. She was seen in the medicine clinic and sent to unit for IM phenergan.  Patient was given Phenergan 25 mg IM in left buttock per nursing documentation. She tolerated medication well and was sent home thereafter.

## 2012-12-26 ENCOUNTER — Emergency Department (HOSPITAL_COMMUNITY): Payer: PRIVATE HEALTH INSURANCE

## 2012-12-26 ENCOUNTER — Emergency Department (HOSPITAL_COMMUNITY)
Admission: EM | Admit: 2012-12-26 | Discharge: 2012-12-27 | Disposition: A | Discharge status: Home / Self Care | Payer: PRIVATE HEALTH INSURANCE | Attending: Emergency Medicine | Admitting: Emergency Medicine

## 2012-12-26 ENCOUNTER — Encounter (HOSPITAL_COMMUNITY): Payer: Self-pay | Admitting: *Deleted

## 2012-12-26 DIAGNOSIS — H9209 Otalgia, unspecified ear: Secondary | ICD-10-CM | POA: Insufficient documentation

## 2012-12-26 DIAGNOSIS — R079 Chest pain, unspecified: Secondary | ICD-10-CM | POA: Insufficient documentation

## 2012-12-26 DIAGNOSIS — M549 Dorsalgia, unspecified: Secondary | ICD-10-CM | POA: Insufficient documentation

## 2012-12-26 DIAGNOSIS — Z3202 Encounter for pregnancy test, result negative: Secondary | ICD-10-CM | POA: Insufficient documentation

## 2012-12-26 DIAGNOSIS — D5700 Hb-SS disease with crisis, unspecified: Secondary | ICD-10-CM | POA: Insufficient documentation

## 2012-12-26 DIAGNOSIS — H538 Other visual disturbances: Secondary | ICD-10-CM | POA: Insufficient documentation

## 2012-12-26 DIAGNOSIS — R51 Headache: Secondary | ICD-10-CM | POA: Insufficient documentation

## 2012-12-26 DIAGNOSIS — M2550 Pain in unspecified joint: Secondary | ICD-10-CM | POA: Insufficient documentation

## 2012-12-26 DIAGNOSIS — R5381 Other malaise: Secondary | ICD-10-CM | POA: Insufficient documentation

## 2012-12-26 DIAGNOSIS — R059 Cough, unspecified: Secondary | ICD-10-CM | POA: Insufficient documentation

## 2012-12-26 LAB — URINALYSIS, ROUTINE W REFLEX MICROSCOPIC
Bilirubin Urine: NEGATIVE
Ketones, ur: NEGATIVE mg/dL
Leukocytes, UA: NEGATIVE
Nitrite: NEGATIVE
Urobilinogen, UA: 1 mg/dL (ref 0.0–1.0)
pH: 7 (ref 5.0–8.0)

## 2012-12-26 LAB — BASIC METABOLIC PANEL
BUN: 8 mg/dL (ref 6–23)
CO2: 25 mEq/L (ref 19–32)
Chloride: 103 mEq/L (ref 96–112)
Creatinine, Ser: 0.57 mg/dL (ref 0.50–1.10)
Potassium: 3.7 mEq/L (ref 3.5–5.1)

## 2012-12-26 LAB — CBC WITH DIFFERENTIAL/PLATELET
Basophils Relative: 0 % (ref 0–1)
Eosinophils Absolute: 0.2 10*3/uL (ref 0.0–0.7)
Eosinophils Relative: 1 % (ref 0–5)
HCT: 27 % — ABNORMAL LOW (ref 36.0–46.0)
Hemoglobin: 9.6 g/dL — ABNORMAL LOW (ref 12.0–15.0)
MCH: 29.5 pg (ref 26.0–34.0)
MCHC: 35.6 g/dL (ref 30.0–36.0)
Monocytes Absolute: 1.6 10*3/uL — ABNORMAL HIGH (ref 0.1–1.0)
Neutro Abs: 9.4 10*3/uL — ABNORMAL HIGH (ref 1.7–7.7)
RBC: 3.25 MIL/uL — ABNORMAL LOW (ref 3.87–5.11)

## 2012-12-26 LAB — RETICULOCYTES
Retic Count, Absolute: 468 10*3/uL — ABNORMAL HIGH (ref 19.0–186.0)
Retic Ct Pct: 14.4 % — ABNORMAL HIGH (ref 0.4–3.1)

## 2012-12-26 LAB — LACTATE DEHYDROGENASE: LDH: 287 U/L — ABNORMAL HIGH (ref 94–250)

## 2012-12-26 MED ORDER — ONDANSETRON HCL 4 MG/2ML IJ SOLN
4.0000 mg | Freq: Once | INTRAMUSCULAR | Status: AC
  Administered 2012-12-26: 4 mg via INTRAVENOUS

## 2012-12-26 MED ORDER — DIPHENHYDRAMINE HCL 50 MG/ML IJ SOLN
25.0000 mg | Freq: Once | INTRAMUSCULAR | Status: AC
  Administered 2012-12-26: 25 mg via INTRAVENOUS
  Filled 2012-12-26: qty 1

## 2012-12-26 MED ORDER — HYDROMORPHONE HCL PF 2 MG/ML IJ SOLN
2.0000 mg | Freq: Once | INTRAMUSCULAR | Status: AC
  Administered 2012-12-26: 2 mg via INTRAVENOUS
  Filled 2012-12-26: qty 1

## 2012-12-26 MED ORDER — ONDANSETRON HCL 4 MG/2ML IJ SOLN
INTRAMUSCULAR | Status: AC
  Filled 2012-12-26: qty 2

## 2012-12-26 MED ORDER — SODIUM CHLORIDE 0.9 % IV BOLUS (SEPSIS)
1000.0000 mL | Freq: Once | INTRAVENOUS | Status: AC
  Administered 2012-12-26: 1000 mL via INTRAVENOUS

## 2012-12-26 MED ORDER — PROMETHAZINE HCL 25 MG/ML IJ SOLN
12.5000 mg | Freq: Once | INTRAMUSCULAR | Status: AC
  Administered 2012-12-26: 12.5 mg via INTRAVENOUS
  Filled 2012-12-26: qty 1

## 2012-12-26 NOTE — ED Provider Notes (Signed)
History     CSN: [id number]  Arrival date & time 12/26/12  1929   First MD Initiated Contact with Patient 12/26/12 2135      Chief Complaint  Patient presents with  . Sickle Cell Pain Crisis    (Consider location/radiation/quality/duration/timing/severity/associated sxs/prior treatment) HPI Comments: PCP Dr. August Saucer  Patient is a 22 y.o. female presenting with sickle cell pain. The history is provided by the patient.  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 The current episode started today. The onset was sudden. The problem occurs occasionally (last crisis last month, comlpicated by [facility], prior to that would go years without crisis ). The problem has been gradually worsening. The pain is associated with a recent illness (Cold 2 weeks ago, stomach virus 2.5 weeks ago). The pain is present in the left side and lower extremities ("it has never been this bad"). Site of pain is localized in bone, muscle and a joint. The pain is similar to prior episodes. The pain is severe. Nothing relieves the symptoms. The symptoms are not relieved by ibuprofen and acetaminophen. The symptoms are aggravated by activity, movement and deep breaths. Associated symptoms include chest pain, blurred vision, congestion, ear pain (left ), headaches, back pain, joint pain, weakness (when pain worsens in LLE ), cough and difficulty breathing. Pertinent negatives include no double vision, no photophobia, no abdominal pain, no constipation, no diarrhea, no nausea, no vomiting, no dysuria, no hematuria, no vaginal bleeding (LMP 12/21/2012), no vaginal discharge, no rhinorrhea, no sore throat, no swollen glands, no neck pain, no neck stiffness, no loss of sensation, no tingling, no rash and no eye pain. There is no swelling present. Intake amount: anorexia. She sickle cell type is SS. There is a history of acute chest syndrome. There have been frequent pain crises (recently ). There is no history of platelet sequestration.  There is no history of stroke. She has been treated with chronic transfusion therapy. She has not been treated with hydroxyurea. There were no sick contacts. Recent Medical Care: last month here for [facility]. Services received include medications given and tests performed.    Past Medical History  Diagnosis Date  . Sickle cell crisis     Past Surgical History  Procedure Laterality Date  . Splenectomy    . Splenectomy, total      History reviewed. No pertinent family history.  History  Substance Use Topics  . Smoking status: Never Smoker   . Smokeless tobacco: Never Used  . Alcohol Use: No    OB History   Grav Para Term Preterm Abortions TAB SAB Ect Mult Living                  Review of Systems  Constitutional: Negative for fever, diaphoresis and activity change.  HENT: Positive for ear pain (left ) and congestion. Negative for sore throat, rhinorrhea and neck pain.   Eyes: Positive for blurred vision. Negative for double vision, photophobia and pain.  Respiratory: Positive for cough.   Cardiovascular: Positive for chest pain.  Gastrointestinal: Negative for nausea, vomiting, abdominal pain, diarrhea and constipation.  Genitourinary: Negative for dysuria, hematuria, vaginal bleeding (LMP 12/21/2012) and vaginal discharge.  Musculoskeletal: Positive for back pain and joint pain. Negative for myalgias.  Skin: Negative for color change, rash and wound.  Neurological: Positive for weakness (when pain worsens in LLE ) and headaches. Negative for tingling.  All other systems reviewed and are negative.    Allergies  Other  Home Medications  Current Outpatient Rx  Name  Route  Sig  Dispense  Refill  . acetaminophen (TYLENOL) 500 MG tablet   Oral   Take 1,000 mg by mouth every 6 (six) hours as needed. pain         . cholecalciferol (VITAMIN D) 1000 UNITS tablet   Oral   Take 1,000 Units by mouth daily.           BP 135/75  Pulse 104  Temp(Src) 98.4 F (36.9 C)  (Oral)  Resp 24  SpO2 99%  LMP 11/15/2012  Physical Exam  Constitutional: She is oriented to person, place, and time. She appears well-developed and well-nourished. No distress.  HENT:  Head: Normocephalic and atraumatic.  Mouth/Throat: Oropharynx is clear and moist. No oropharyngeal exudate.  Eyes: Conjunctivae and EOM are normal. Pupils are equal, round, and reactive to light. No scleral icterus.  Neck: Normal range of motion. Neck supple. No tracheal deviation present. No thyromegaly present.  Cardiovascular: Regular rhythm, normal heart sounds and intact distal pulses.   Mild tachycardia   Pulmonary/Chest: Effort normal and breath sounds normal. No stridor.  LCAB  Abdominal: Soft. There is no tenderness.  Musculoskeletal:  LLE ttp along entire extremity. Normal ROM  Neurological: She is alert and oriented to person, place, and time. Coordination normal.  Strength 2/5 LLE. Intact sensation. Good coordination. CN intact.   Skin: Skin is warm and dry. No rash noted. She is not diaphoretic. No erythema. No pallor.  Psychiatric: She has a normal mood and affect. Her behavior is normal.    ED Course  Procedures (including critical care time)  Labs Reviewed  CBC WITH DIFFERENTIAL - Abnormal; Notable for the following:    WBC 17.3 (*)    RBC 3.25 (*)    Hemoglobin 9.6 (*)    HCT 27.0 (*)    RDW 17.2 (*)    Platelets 557 (*)    Neutro Abs 9.4 (*)    Lymphs Abs 6.1 (*)    Monocytes Absolute 1.6 (*)    All other components within normal limits  RETICULOCYTES - Abnormal; Notable for the following:    Retic Ct Pct 14.4 (*)    RBC. 3.25 (*)    Retic Count, Manual 468.0 (*)    All other components within normal limits  LACTATE DEHYDROGENASE - Abnormal; Notable for the following:    LDH 287 (*)    All other components within normal limits  BASIC METABOLIC PANEL  URINALYSIS, ROUTINE W REFLEX MICROSCOPIC  PREGNANCY, URINE   No results found.   Date: 12/27/2012  Rate: 90   Rhythm: normal sinus rhythm  QRS Axis: normal  Intervals: normal  ST/T Wave abnormalities: normal  Conduction Disutrbances: none  Narrative Interpretation:   Old EKG Reviewed: No significant changes noted     No diagnosis found.    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 Of note, she has been seen in the ED x 9 visits within past 6 months and only takes Tylenol OTC at home prn pain which she reports is typically adequate. Pt states she has a hx of acute chest syndrome however in reviewing her chart i have not found any evidence of such diagnosis.  Today patient does report chest pain and shortness of breath , however she is afebrile without hypoxia, tachypnea  or acute infiltrate seen on chest x-ray. Pt is mildly tachycardic w a leukocytosis of 17.3 hemoglobin of 9.6 & retic count of 14.4 she reports she would prefer to go home and be admitted and be avoided and states that she is currently Dr. Diamantina Providence patient.  Patient does not want to go to the sickle cell clinic because she states "all they do is give you IV pain medication and get you all don't doubt that you sleep." Pt is not currently on hydroxyurea.   Patient reexamined prior to discharge and states that her pain is adequately been managed in emergency department.  She will be sent home with a prescription for 30 Percocets to use as needed for pain.  Low concern for acute chest syndrome, however did discuss presenting symptoms with patient and advised to return to the emergency department if these symptoms develop. BP 108/75  Pulse 96  Temp(Src) 98.4 F (36.9 C) (Oral)  Resp 16  SpO2 98%  LMP 11/15/2012         Jaci Carrel, PA-C 12/27/12 0210

## 2012-12-26 NOTE — ED Notes (Signed)
Patient started with left leg pain today, also c/o SOB when pain is worse.  Denies nausea, vomiting, fever.

## 2012-12-26 NOTE — ED Notes (Signed)
Pt states that she needs to be pre treated with anti nausea medications prior to pain medication

## 2012-12-27 MED ORDER — HYDROMORPHONE HCL PF 1 MG/ML IJ SOLN
1.0000 mg | Freq: Once | INTRAMUSCULAR | Status: AC
  Administered 2012-12-27: 1 mg via INTRAVENOUS
  Filled 2012-12-27: qty 1

## 2012-12-27 MED ORDER — PERCOCET 5-325 MG PO TABS: 1.0000 | ORAL_TABLET | Freq: Four times a day (QID) | ORAL | Status: DC | PRN

## 2012-12-27 MED ORDER — PROMETHAZINE HCL 25 MG PO TABS: 25.0000 mg | ORAL_TABLET | Freq: Four times a day (QID) | ORAL | Status: DC | PRN

## 2012-12-27 NOTE — ED Notes (Signed)
Pt states her nausea is feeling better and is requesting something to eat.

## 2012-12-27 NOTE — Progress Notes (Signed)
Patient continues to have desaturations on nasal canula. She complains of inability to tolerate the prongs at/in her nare. Venturi mask applied. Snoring and some obstruction noted upon patient falling asleep. No previous sleep study on file. Patient denies having sleep apnea. Supplemental oxygen titrated to 40 percent to maintain saturations greater than 90 when asleep. RN aware.

## 2013-01-01 ENCOUNTER — Encounter (HOSPITAL_COMMUNITY): Payer: Self-pay | Admitting: *Deleted

## 2013-01-01 ENCOUNTER — Emergency Department (HOSPITAL_COMMUNITY)
Admission: EM | Admit: 2013-01-01 | Discharge: 2013-01-02 | Disposition: A | Discharge status: Home / Self Care | Payer: Medicaid Other | Source: Home / Self Care | Attending: Emergency Medicine | Admitting: Emergency Medicine

## 2013-01-01 ENCOUNTER — Emergency Department (HOSPITAL_COMMUNITY): Payer: Medicaid Other

## 2013-01-01 DIAGNOSIS — Y939 Activity, unspecified: Secondary | ICD-10-CM | POA: Insufficient documentation

## 2013-01-01 DIAGNOSIS — T699XXA Effect of reduced temperature, unspecified, initial encounter: Secondary | ICD-10-CM | POA: Insufficient documentation

## 2013-01-01 DIAGNOSIS — X31XXXA Exposure to excessive natural cold, initial encounter: Secondary | ICD-10-CM | POA: Insufficient documentation

## 2013-01-01 DIAGNOSIS — M545 Low back pain, unspecified: Secondary | ICD-10-CM | POA: Insufficient documentation

## 2013-01-01 DIAGNOSIS — D57219 Sickle-cell/Hb-C disease with crisis, unspecified: Secondary | ICD-10-CM | POA: Insufficient documentation

## 2013-01-01 DIAGNOSIS — Z79899 Other long term (current) drug therapy: Secondary | ICD-10-CM | POA: Insufficient documentation

## 2013-01-01 DIAGNOSIS — R109 Unspecified abdominal pain: Secondary | ICD-10-CM | POA: Insufficient documentation

## 2013-01-01 DIAGNOSIS — IMO0001 Reserved for inherently not codable concepts without codable children: Secondary | ICD-10-CM | POA: Insufficient documentation

## 2013-01-01 DIAGNOSIS — Y929 Unspecified place or not applicable: Secondary | ICD-10-CM | POA: Insufficient documentation

## 2013-01-01 DIAGNOSIS — D5700 Hb-SS disease with crisis, unspecified: Secondary | ICD-10-CM | POA: Insufficient documentation

## 2013-01-01 DIAGNOSIS — Z9089 Acquired absence of other organs: Secondary | ICD-10-CM | POA: Insufficient documentation

## 2013-01-01 DIAGNOSIS — R079 Chest pain, unspecified: Secondary | ICD-10-CM | POA: Insufficient documentation

## 2013-01-01 LAB — RETICULOCYTES: Retic Count, Absolute: 478.9 10*3/uL — ABNORMAL HIGH (ref 19.0–186.0)

## 2013-01-01 LAB — COMPREHENSIVE METABOLIC PANEL
BUN: 8 mg/dL (ref 6–23)
CO2: 26 mEq/L (ref 19–32)
Calcium: 9.2 mg/dL (ref 8.4–10.5)
Creatinine, Ser: 0.55 mg/dL (ref 0.50–1.10)
GFR calc Af Amer: >90 mL/min (ref >90)
GFR calc non Af Amer: >90 mL/min (ref >90)
Glucose, Bld: 93 mg/dL (ref 70–99)

## 2013-01-01 LAB — CBC WITH DIFFERENTIAL/PLATELET
Basophils Absolute: 0.1 10*3/uL (ref 0.0–0.1)
Basophils Relative: 0 % (ref 0–1)
Eosinophils Absolute: 0.3 10*3/uL (ref 0.0–0.7)
Eosinophils Relative: 2 % (ref 0–5)
HCT: 27.3 % — ABNORMAL LOW (ref 36.0–46.0)
MCHC: 34.8 g/dL (ref 30.0–36.0)
Monocytes Absolute: 1.6 10*3/uL — ABNORMAL HIGH (ref 0.1–1.0)
Neutro Abs: 6.5 10*3/uL (ref 1.7–7.7)
RDW: 17.5 % — ABNORMAL HIGH (ref 11.5–15.5)

## 2013-01-01 MED ORDER — HYDROMORPHONE HCL PF 1 MG/ML IJ SOLN
1.0000 mg | Freq: Once | INTRAMUSCULAR | Status: AC
  Administered 2013-01-02: 1 mg via INTRAVENOUS
  Filled 2013-01-01: qty 1

## 2013-01-01 MED ORDER — HYDROMORPHONE HCL PF 1 MG/ML IJ SOLN
INTRAMUSCULAR | Status: AC
  Filled 2013-01-01: qty 1

## 2013-01-01 MED ORDER — HYDROMORPHONE HCL PF 1 MG/ML IJ SOLN
1.0000 mg | Freq: Once | INTRAMUSCULAR | Status: AC
  Administered 2013-01-01: 1 mg via INTRAVENOUS

## 2013-01-01 MED ORDER — ONDANSETRON HCL 4 MG/2ML IJ SOLN
INTRAMUSCULAR | Status: AC
  Filled 2013-01-01: qty 2

## 2013-01-01 MED ORDER — PROMETHAZINE HCL 25 MG/ML IJ SOLN
12.5000 mg | Freq: Once | INTRAMUSCULAR | Status: AC
  Administered 2013-01-02: 12.5 mg via INTRAVENOUS
  Filled 2013-01-01: qty 1

## 2013-01-01 MED ORDER — ONDANSETRON HCL 4 MG/2ML IJ SOLN
4.0000 mg | Freq: Once | INTRAMUSCULAR | Status: AC
  Administered 2013-01-01: 4 mg via INTRAVENOUS

## 2013-01-01 MED ORDER — SODIUM CHLORIDE 0.9 % IV BOLUS (SEPSIS)
1000.0000 mL | Freq: Once | INTRAVENOUS | Status: AC
  Administered 2013-01-01: 1000 mL via INTRAVENOUS

## 2013-01-01 NOTE — ED Provider Notes (Signed)
Medical screening examination/treatment/procedure(s) were performed by non-physician practitioner and as supervising physician I was immediately available for consultation/collaboration.  Derwood Kaplan, MD 01/01/13 9604

## 2013-01-01 NOTE — ED Provider Notes (Signed)
History     CSN: [id number]  Arrival date & time 01/01/13  2052   First MD Initiated Contact with Patient 01/01/13 2308      Chief Complaint  Patient presents with  . Sickle Cell Pain Crisis    (Consider location/radiation/quality/duration/timing/severity/associated sxs/prior treatment) Patient is a 22 y.o. female presenting with sickle cell pain. The history is provided by the patient.  Sickle Cell Pain Crisis  This is a recurrent problem. The current episode started 3 to 5 days ago. The problem occurs continuously. The problem has been gradually worsening. The pain is severe. Associated symptoms include chest pain. Pertinent negatives include no abdominal pain, no diarrhea, no vomiting, no dysuria, no weakness and no cough. Associated symptoms comments: Pain started in lower extremities 3 days ago and now extends proximally into both sides of trunk. No fever, or cough. No N, V. She was seen in ED 3 days ago and has been taking Percocet at home since discharge without relief. She also reports a history of acute chest syndrome with pain similar to what she is experiencing tonight..    Past Medical History  Diagnosis Date  . Sickle cell crisis     Past Surgical History  Procedure Laterality Date  . Splenectomy    . Splenectomy, total      History reviewed. No pertinent family history.  History  Substance Use Topics  . Smoking status: Never Smoker   . Smokeless tobacco: Never Used  . Alcohol Use: No    OB History   Grav Para Term Preterm Abortions TAB SAB Ect Mult Living                  Review of Systems  Constitutional: Negative for fever and chills.  HENT: Negative.   Respiratory: Negative.  Negative for cough and shortness of breath.   Cardiovascular: Positive for chest pain.       Pain is lateral chest bilaterally.  Gastrointestinal: Negative.  Negative for vomiting, abdominal pain and diarrhea.  Genitourinary: Negative for dysuria and menstrual problem.   Musculoskeletal: Positive for myalgias.  Skin: Negative.   Neurological: Negative.  Negative for weakness.    Allergies  Other  Home Medications   Current Outpatient Rx  Name  Route  Sig  Dispense  Refill  . acetaminophen (TYLENOL) 500 MG tablet   Oral   Take 1,000 mg by mouth every 6 (six) hours as needed. pain         . cholecalciferol (VITAMIN D) 1000 UNITS tablet   Oral   Take 1,000 Units by mouth daily.         . promethazine (PHENERGAN) 25 MG tablet   Oral   Take 1 tablet (25 mg total) by mouth every 6 (six) hours as needed for nausea.   20 tablet   0     BP 115/68  Pulse 95  Temp(Src) 99.1 F (37.3 C) (Oral)  Resp 19  SpO2 100%  LMP 11/15/2012  Physical Exam  Constitutional: She is oriented to person, place, and time. She appears well-developed and well-nourished. No distress.  HENT:  Head: Normocephalic.  Eyes: Conjunctivae are normal.  Neck: Normal range of motion. Neck supple.  Cardiovascular: Normal rate, regular rhythm and intact distal pulses.   Pulmonary/Chest: Effort normal and breath sounds normal.  Chest wall tenderness at lateral aspects bilaterally. No swelling.  Abdominal: Soft. Bowel sounds are normal. There is no rebound and no guarding.  Tenderness to lateral aspects bilaterally.  Musculoskeletal: Normal  range of motion. She exhibits no edema.  Generalized tenderness to bilateral lower extremities. No swelling, redness or lesion.  Neurological: She is alert and oriented to person, place, and time.  Skin: Skin is warm and dry. No rash noted.  Psychiatric: She has a normal mood and affect.    ED Course  Procedures (including critical care time)  Labs Reviewed  CBC WITH DIFFERENTIAL - Abnormal; Notable for the following:    WBC 13.9 (*)    RBC 3.28 (*)    Hemoglobin 9.5 (*)    HCT 27.3 (*)    RDW 17.5 (*)    Platelets 509 (*)    Lymphs Abs 5.5 (*)    Monocytes Absolute 1.6 (*)    All other components within normal limits   RETICULOCYTES - Abnormal; Notable for the following:    Retic Ct Pct 14.6 (*)    RBC. 3.28 (*)    Retic Count, Manual 478.9 (*)    All other components within normal limits  COMPREHENSIVE METABOLIC PANEL - Abnormal; Notable for the following:    Total Bilirubin 1.3 (*)    All other components within normal limits   No results found.   No diagnosis found.  1. Sickle cell disease  MDM  She is feeling improved with medications. Labs at baseline by comparison. No evidence of acute chest on CXR. She states she is comfortable with discharge home if pain were better. Additional medications ordered. Plan to discharge home.        Arnoldo Hooker, PA-C 01/02/13 [phone]

## 2013-01-01 NOTE — ED Notes (Signed)
Pt reports sickle cell pain crisis that began last Friday, pt admits entire left side and back pain. Pt denies any n/v/d or shortness of breath at present. Pt in no acute distress.

## 2013-01-02 ENCOUNTER — Encounter (HOSPITAL_COMMUNITY): Payer: Self-pay | Admitting: *Deleted

## 2013-01-02 MED ORDER — OXYCODONE-ACETAMINOPHEN 5-325 MG PO TABS: 1.0000 | ORAL_TABLET | ORAL | Status: DC | PRN

## 2013-01-02 MED ORDER — HYDROMORPHONE HCL PF 2 MG/ML IJ SOLN
2.0000 mg | Freq: Once | INTRAMUSCULAR | Status: AC
  Administered 2013-01-02: 2 mg via INTRAVENOUS
  Filled 2013-01-02: qty 1

## 2013-01-02 NOTE — ED Provider Notes (Signed)
Medical screening examination/treatment/procedure(s) were performed by non-physician practitioner and as supervising physician I was immediately available for consultation/collaboration.   John L Molpus, MD 01/02/13 0130 

## 2013-01-02 NOTE — ED Notes (Addendum)
Pt states that she was seen a week ago for sickle cell pain. Pt has not been drinking well. Pt states that her pain is in her flanks and her back. Pt denies problems with urine or bowels. Pt states similar pain to past sickle cells crisis. (pt did not mention that she was seen at [facility] yesterday for sickle cell and received pain medications and rx medications to go home with.)

## 2013-01-03 ENCOUNTER — Emergency Department (HOSPITAL_COMMUNITY)
Admission: EM | Admit: 2013-01-03 | Discharge: 2013-01-03 | Disposition: A | Discharge status: Home / Self Care | Payer: Medicaid Other | Attending: Emergency Medicine | Admitting: Emergency Medicine

## 2013-01-03 MED ORDER — HYDROMORPHONE HCL PF 2 MG/ML IJ SOLN
2.0000 mg | Freq: Once | INTRAMUSCULAR | Status: AC
  Administered 2013-01-03: 2 mg via INTRAVENOUS
  Filled 2013-01-03: qty 1

## 2013-01-03 MED ORDER — SODIUM CHLORIDE 0.9 % IV BOLUS (SEPSIS)
1000.0000 mL | Freq: Once | INTRAVENOUS | Status: AC
  Administered 2013-01-03: 1000 mL via INTRAVENOUS

## 2013-01-03 NOTE — ED Notes (Signed)
Pt ambulated back to room. Pt undressed and placed in gown. Pt still having sickle cell pain.

## 2013-01-03 NOTE — ED Provider Notes (Signed)
History     CSN: [id number]  Arrival date & time 01/02/13  2316   First MD Initiated Contact with Patient 01/03/13 0122      Chief Complaint  Patient presents with  . Sickle Cell Pain Crisis    (Consider location/radiation/quality/duration/timing/severity/associated sxs/prior treatment) HPI Comments: Patient with history of sickle cell disease presents with pain in the lower back and both flank regions.  She was seen last night for the same, given, fluids and pain meds and discharged.  She returns with increasing pain not improved with what she was sent home with.  No fevers or chills.  No cough.  No chest pain.  Patient is a 22 y.o. female presenting with sickle cell pain. The history is provided by the patient.  Sickle Cell Pain Crisis  This is a recurrent problem. The current episode started 2 days ago. The problem occurs occasionally. The problem has been rapidly worsening. The pain is associated with cold exposure. The pain is present in the right side and left side. Site of pain is localized in bone and muscle. The pain is moderate. Nothing relieves the symptoms. The symptoms are aggravated by activity and movement.    Past Medical History  Diagnosis Date  . Sickle cell crisis     Past Surgical History  Procedure Laterality Date  . Splenectomy    . Splenectomy, total      History reviewed. No pertinent family history.  History  Substance Use Topics  . Smoking status: Never Smoker   . Smokeless tobacco: Never Used  . Alcohol Use: No    OB History   Grav Para Term Preterm Abortions TAB SAB Ect Mult Living                  Review of Systems  All other systems reviewed and are negative.    Allergies  Other  Home Medications   Current Outpatient Rx  Name  Route  Sig  Dispense  Refill  . acetaminophen (TYLENOL) 500 MG tablet   Oral   Take 1,000 mg by mouth every 6 (six) hours as needed. pain         . cholecalciferol (VITAMIN D) 1000 UNITS tablet  Oral   Take 1,000 Units by mouth daily.           BP 130/78  Pulse 108  Temp(Src) 97.7 F (36.5 C) (Oral)  Resp 16  SpO2 98%  LMP 11/15/2012  Physical Exam  Nursing note and vitals reviewed. Constitutional: She is oriented to person, place, and time. She appears well-developed and well-nourished. No distress.  The patient is eating potato chips, drinking soda.  In no distress.  HENT:  Head: Normocephalic and atraumatic.  Neck: Normal range of motion. Neck supple.  Cardiovascular: Normal rate and regular rhythm.  Exam reveals no gallop and no friction rub.   No murmur heard. Pulmonary/Chest: Effort normal and breath sounds normal. No respiratory distress. She has no wheezes.  Abdominal: Soft. Bowel sounds are normal. She exhibits no distension. There is no tenderness.  Musculoskeletal: Normal range of motion.  Neurological: She is alert and oriented to person, place, and time.  Skin: Skin is warm and dry. She is not diaphoretic.    ED Course  Procedures (including critical care time)  Labs Reviewed - No data to display Dg Chest 2 View  01/02/2013  *RADIOLOGY REPORT*  Clinical Data: Sickle cell pain crisis.  CHEST - 2 VIEW  Comparison: Chest radiograph performed 12/26/2012  Findings:  The lungs are well-aerated and clear.  There is no evidence of focal opacification, pleural effusion or pneumothorax.  The heart is normal in size; the mediastinal contour is within normal limits.  No acute osseous abnormalities are seen.  IMPRESSION: No acute cardiopulmonary process seen.   Original Report Authenticated By: Tonia Ghent, M.D.      No diagnosis found.    MDM  The patient appears well.  She was initially eating chips and sipping on a soda when I went into the room.  After a total of 4 mg of dilaudid, she had to be awaken to inform the nurse her pain was 9/10.  She appears very appropriate for discharge.  Her labs from yesterday evening were reviewed and are reassuring.  No need  to repeat today.        Geoffery Lyons, MD 01/03/13 [phone]

## 2013-01-09 ENCOUNTER — Encounter: Payer: Self-pay | Admitting: Hematology

## 2013-01-10 ENCOUNTER — Encounter (HOSPITAL_COMMUNITY): Payer: Self-pay | Admitting: *Deleted

## 2013-01-10 ENCOUNTER — Emergency Department (HOSPITAL_COMMUNITY)
Admission: EM | Admit: 2013-01-10 | Discharge: 2013-01-10 | Disposition: A | Discharge status: Home / Self Care | Payer: PRIVATE HEALTH INSURANCE | Attending: Emergency Medicine | Admitting: Emergency Medicine

## 2013-01-10 DIAGNOSIS — Z8659 Personal history of other mental and behavioral disorders: Secondary | ICD-10-CM | POA: Insufficient documentation

## 2013-01-10 DIAGNOSIS — D5700 Hb-SS disease with crisis, unspecified: Secondary | ICD-10-CM | POA: Insufficient documentation

## 2013-01-10 DIAGNOSIS — Z79899 Other long term (current) drug therapy: Secondary | ICD-10-CM | POA: Insufficient documentation

## 2013-01-10 LAB — POCT I-STAT, CHEM 8
Glucose, Bld: 100 mg/dL — ABNORMAL HIGH (ref 70–99)
HCT: 33 % — ABNORMAL LOW (ref 36.0–46.0)
Hemoglobin: 11.2 g/dL — ABNORMAL LOW (ref 12.0–15.0)
Potassium: 3.7 mEq/L (ref 3.5–5.1)
Sodium: 141 mEq/L (ref 135–145)
TCO2: 26 mmol/L (ref 0–100)

## 2013-01-10 LAB — CBC
Hemoglobin: 9.6 g/dL — ABNORMAL LOW (ref 12.0–15.0)
RBC: 3.35 MIL/uL — ABNORMAL LOW (ref 3.87–5.11)

## 2013-01-10 LAB — RETICULOCYTES
RBC.: 3.35 MIL/uL — ABNORMAL LOW (ref 3.87–5.11)
Retic Ct Pct: 10.8 % — ABNORMAL HIGH (ref 0.4–3.1)

## 2013-01-10 MED ORDER — HYDROMORPHONE HCL PF 2 MG/ML IJ SOLN
2.0000 mg | Freq: Once | INTRAMUSCULAR | Status: AC
  Administered 2013-01-10: 2 mg via INTRAVENOUS
  Filled 2013-01-10: qty 1

## 2013-01-10 MED ORDER — OXYCODONE-ACETAMINOPHEN 5-325 MG PO TABS: 2.0000 | ORAL_TABLET | ORAL | Status: DC | PRN

## 2013-01-10 MED ORDER — SODIUM CHLORIDE 0.9 % IV BOLUS (SEPSIS)
1000.0000 mL | Freq: Once | INTRAVENOUS | Status: AC
  Administered 2013-01-10: 1000 mL via INTRAVENOUS

## 2013-01-10 MED ORDER — ONDANSETRON HCL 4 MG/2ML IJ SOLN
4.0000 mg | Freq: Once | INTRAMUSCULAR | Status: AC
  Administered 2013-01-10: 4 mg via INTRAVENOUS
  Filled 2013-01-10: qty 2

## 2013-01-10 MED ORDER — ONDANSETRON HCL 8 MG PO TABS: 8.0000 mg | ORAL_TABLET | Freq: Three times a day (TID) | ORAL | Status: DC | PRN

## 2013-01-10 MED ORDER — KETOROLAC TROMETHAMINE 30 MG/ML IJ SOLN
30.0000 mg | Freq: Once | INTRAMUSCULAR | Status: AC
  Administered 2013-01-10: 30 mg via INTRAVENOUS
  Filled 2013-01-10: qty 1

## 2013-01-10 MED ORDER — HYDROMORPHONE HCL PF 1 MG/ML IJ SOLN
1.0000 mg | Freq: Once | INTRAMUSCULAR | Status: AC
  Administered 2013-01-10: 1 mg via INTRAVENOUS
  Filled 2013-01-10: qty 1

## 2013-01-10 NOTE — ED Provider Notes (Signed)
History     CSN: [id number]  Arrival date & time 01/10/13  0240   First MD Initiated Contact with Patient 01/10/13 0434      Chief Complaint  Patient presents with  . Arm Pain    (Consider location/radiation/quality/duration/timing/severity/associated sxs/prior treatment) HPI History provided by pt.   Pt has sickle cell anemia.  Pain crises are infrequent and she takes tylenol pm for pain at home.  Has had migrating pain for the past 2 weeks.  Currently w/ severe, diffuse pain of LUE that is refractory to tylenol.  Pain typical of sickle cell.  No associated fever, CP, SOB, skin changes, weakness/paresthesias.  Denies trauma.  Past Medical History  Diagnosis Date  . Sickle cell crisis   . Depression   . Headache   . Abortion     05/2012    Past Surgical History  Procedure Laterality Date  . Splenectomy    . Splenectomy, total      Family History  Problem Relation Age of Onset  . Hypertension Mother   . Sickle cell anemia Sister   . Kidney disease Sister   . Arthritis Sister   . Sickle cell anemia Sister   . Sickle cell trait Sister   . Heart disease Maternal Aunt   . Heart disease Maternal Uncle     History  Substance Use Topics  . Smoking status: Never Smoker   . Smokeless tobacco: Never Used  . Alcohol Use: No    OB History   Grav Para Term Preterm Abortions TAB SAB Ect Mult Living                  Review of Systems  All other systems reviewed and are negative.    Allergies  Other  Home Medications   Current Outpatient Rx  Name  Route  Sig  Dispense  Refill  . acetaminophen (TYLENOL) 500 MG tablet   Oral   Take 1,000 mg by mouth every 6 (six) hours as needed. pain         . cholecalciferol (VITAMIN D) 1000 UNITS tablet   Oral   Take 1,000 Units by mouth daily.         Marland Kitchen HYDROcodone-acetaminophen (LORTAB) 10-500 MG per tablet   Oral   Take 1 tablet by mouth every 6 (six) hours as needed for pain.         Marland Kitchen ondansetron (ZOFRAN) 8  MG tablet   Oral   Take 1 tablet (8 mg total) by mouth every 8 (eight) hours as needed for nausea.   20 tablet   0   . oxyCODONE-acetaminophen (PERCOCET/ROXICET) 5-325 MG per tablet   Oral   Take 2 tablets by mouth every 4 (four) hours as needed for pain.   20 tablet   0     BP 113/53  Pulse 86  Temp(Src) 98.3 F (36.8 C) (Oral)  Resp 16  Ht 5\' 4"  (1.626 m)  SpO2 97%  LMP 01/10/2013  Physical Exam  Nursing note and vitals reviewed. Constitutional: She is oriented to person, place, and time. She appears well-developed and well-nourished. No distress.  Uncomfortable appearing.  Crying.   HENT:  Head: Normocephalic and atraumatic.  Eyes:  Normal appearance  Neck: Normal range of motion.  Cardiovascular: Normal rate and regular rhythm.   Pulmonary/Chest: Effort normal and breath sounds normal. No respiratory distress.  Musculoskeletal: Normal range of motion.  LUE w/out deformity, skin changes, edema.  Diffuse, mild ttp from shoulder to  fingers, and full, but painful active ROM of all joints.  2+ radial pulse and distal sensation intact.      Neurological: She is alert and oriented to person, place, and time.  Skin: Skin is warm and dry. No rash noted.  Psychiatric: She has a normal mood and affect. Her behavior is normal.    ED Course  Procedures (including critical care time)  Labs Reviewed  CBC - Abnormal; Notable for the following:    WBC 14.9 (*)    RBC 3.35 (*)    Hemoglobin 9.6 (*)    HCT 28.0 (*)    RDW 17.2 (*)    Platelets 600 (*)    All other components within normal limits  RETICULOCYTES - Abnormal; Notable for the following:    Retic Ct Pct 10.8 (*)    RBC. 3.35 (*)    Retic Count, Manual 361.8 (*)    All other components within normal limits  POCT I-STAT, CHEM 8 - Abnormal; Notable for the following:    Glucose, Bld 100 (*)    Calcium, Ion 1.27 (*)    Hemoglobin 11.2 (*)    HCT 33.0 (*)    All other components within normal limits   No  results found.   1. Sickle cell crisis       MDM  21yo F presents w/ typical sickle cell pain of LUE.  No relief w/ tylenol at home and this the only analgesic prescribed to her by Dr. August Saucer.   Has not had fever, CP or SOB and no signs of trauma or infectious process on exam.  Labs sig mild leukocytosis, stable hemoglobin and elevated reticulocyte count.  Pt received a total of 3mg  IV diluadid as well as 30mg  IV toradol and pain improved.  D/c'd home w/ percocet and advised her to f/u with Dr. August Saucer.  Return precautions discussed.         Arie Sabina Schinlever, PA-C 01/10/13 1324

## 2013-01-10 NOTE — ED Notes (Signed)
Pt c/o sickle cell pain in her left arm x 2 wks; c/o lump in left arm at previous iv site also

## 2013-01-10 NOTE — ED Notes (Signed)
Another RN to attempt IV at this time  

## 2013-01-12 NOTE — ED Provider Notes (Signed)
Medical screening examination/treatment/procedure(s) were performed by non-physician practitioner and as supervising physician I was immediately available for consultation/collaboration.  Sunnie Nielsen, MD 01/12/13 1028

## 2013-03-03 ENCOUNTER — Encounter (HOSPITAL_COMMUNITY): Payer: Self-pay | Admitting: Emergency Medicine

## 2013-03-03 ENCOUNTER — Emergency Department (HOSPITAL_COMMUNITY)
Admission: EM | Admit: 2013-03-03 | Discharge: 2013-03-04 | Disposition: A | Discharge status: Home / Self Care | Payer: PRIVATE HEALTH INSURANCE | Attending: Emergency Medicine | Admitting: Emergency Medicine

## 2013-03-03 DIAGNOSIS — D5700 Hb-SS disease with crisis, unspecified: Secondary | ICD-10-CM | POA: Insufficient documentation

## 2013-03-03 DIAGNOSIS — F3289 Other specified depressive episodes: Secondary | ICD-10-CM | POA: Insufficient documentation

## 2013-03-03 DIAGNOSIS — M79609 Pain in unspecified limb: Secondary | ICD-10-CM | POA: Insufficient documentation

## 2013-03-03 DIAGNOSIS — Z8679 Personal history of other diseases of the circulatory system: Secondary | ICD-10-CM | POA: Insufficient documentation

## 2013-03-03 DIAGNOSIS — F329 Major depressive disorder, single episode, unspecified: Secondary | ICD-10-CM | POA: Insufficient documentation

## 2013-03-03 LAB — RETICULOCYTES
RBC.: 3.22 MIL/uL — ABNORMAL LOW (ref 3.87–5.11)
Retic Count, Absolute: 376.7 10*3/uL — ABNORMAL HIGH (ref 19.0–186.0)

## 2013-03-03 LAB — CBC WITH DIFFERENTIAL/PLATELET
Basophils Absolute: 0 10*3/uL (ref 0.0–0.1)
Eosinophils Absolute: 0.2 10*3/uL (ref 0.0–0.7)
Lymphocytes Relative: 44 % (ref 12–46)
MCHC: 36 g/dL (ref 30.0–36.0)
Monocytes Relative: 11 % (ref 3–12)
Neutrophils Relative %: 44 % (ref 43–77)
Platelets: 571 10*3/uL — ABNORMAL HIGH (ref 150–400)
RDW: 16.3 % — ABNORMAL HIGH (ref 11.5–15.5)
WBC: 15.6 10*3/uL — ABNORMAL HIGH (ref 4.0–10.5)

## 2013-03-03 LAB — BASIC METABOLIC PANEL
BUN: 7 mg/dL (ref 6–23)
Calcium: 9.5 mg/dL (ref 8.4–10.5)
GFR calc Af Amer: >90 mL/min (ref >90)
GFR calc non Af Amer: >90 mL/min (ref >90)
Potassium: 4 mEq/L (ref 3.5–5.1)
Sodium: 140 mEq/L (ref 135–145)

## 2013-03-03 MED ORDER — HYDROMORPHONE HCL PF 1 MG/ML IJ SOLN
1.0000 mg | Freq: Once | INTRAMUSCULAR | Status: AC
  Administered 2013-03-04: 1 mg via INTRAVENOUS
  Filled 2013-03-03: qty 1

## 2013-03-03 MED ORDER — HYDROMORPHONE HCL PF 1 MG/ML IJ SOLN
1.0000 mg | INTRAMUSCULAR | Status: DC | PRN
  Administered 2013-03-03: 1 mg via INTRAVENOUS
  Filled 2013-03-03: qty 1

## 2013-03-03 MED ORDER — ONDANSETRON HCL 4 MG/2ML IJ SOLN
4.0000 mg | Freq: Once | INTRAMUSCULAR | Status: AC
  Administered 2013-03-03: 4 mg via INTRAVENOUS
  Filled 2013-03-03: qty 2

## 2013-03-03 NOTE — ED Provider Notes (Signed)
History     CSN: [id number]  Arrival date & time 03/03/13  2116   First MD Initiated Contact with Patient 03/03/13 2148      Chief Complaint  Patient presents with  . Sickle Cell Pain Crisis    (Consider location/radiation/quality/duration/timing/severity/associated sxs/prior treatment) Patient is a 22 y.o. female presenting with sickle cell pain. The history is provided by the patient.  Sickle Cell Pain Crisis  This is a recurrent problem. The current episode started 5 to 7 days ago. The onset was gradual. The problem occurs continuously. The problem has been gradually worsening. The pain is associated with cold exposure. The pain is present in the upper extremities (lower back). Site of pain is localized in muscle. The pain is similar to prior episodes. The pain is moderate. Relieved by: initially relieved by tylenol but no longer working. The symptoms are not relieved by acetaminophen. The symptoms are aggravated by activity. Associated symptoms include back pain. Pertinent negatives include no chest pain, no nausea, no vomiting, no dysuria, no headaches, no neck pain, no weakness and no cough. There is no swelling present. She has been eating and drinking normally. Her past medical history does not include chronic back pain. She sickle cell type is SS. There is no history of acute chest syndrome. There have been no frequent pain crises. There is no history of stroke. She has not been treated with chronic transfusion therapy. She has not been treated with hydroxyurea. There were sick contacts at home. She has received no recent medical care.    Past Medical History  Diagnosis Date  . Sickle cell crisis   . Depression   . Headache   . Abortion     05/2012    Past Surgical History  Procedure Laterality Date  . Splenectomy    . Splenectomy, total      Family History  Problem Relation Age of Onset  . Hypertension Mother   . Sickle cell anemia Sister   . Kidney disease Sister   .  Arthritis Sister   . Sickle cell anemia Sister   . Sickle cell trait Sister   . Heart disease Maternal Aunt   . Heart disease Maternal Uncle     History  Substance Use Topics  . Smoking status: Never Smoker   . Smokeless tobacco: Never Used  . Alcohol Use: No    OB History   Grav Para Term Preterm Abortions TAB SAB Ect Mult Living                  Review of Systems  Constitutional: Negative for fever.  HENT: Negative for neck pain.   Respiratory: Negative for cough.   Cardiovascular: Negative for chest pain.  Gastrointestinal: Negative for nausea and vomiting.  Genitourinary: Negative for dysuria.  Musculoskeletal: Positive for back pain.  Neurological: Negative for weakness and headaches.  All other systems reviewed and are negative.    Allergies  Other  Home Medications   Current Outpatient Rx  Name  Route  Sig  Dispense  Refill  . cholecalciferol (VITAMIN D) 1000 UNITS tablet   Oral   Take 1,000 Units by mouth daily.           BP 136/106  Pulse 94  Temp(Src) 98.7 F (37.1 C) (Oral)  Resp 18  SpO2 97%  LMP 02/08/2013  Physical Exam  Nursing note and vitals reviewed. Constitutional: She is oriented to person, place, and time. She appears well-developed and well-nourished. No distress.  HENT:  Head: Normocephalic and atraumatic.  Mouth/Throat: Oropharynx is clear and moist.  Eyes: Conjunctivae and EOM are normal. Pupils are equal, round, and reactive to light.  Neck: Normal range of motion. Neck supple.  Cardiovascular: Normal rate, regular rhythm and intact distal pulses.   No murmur heard. Pulmonary/Chest: Effort normal and breath sounds normal. No respiratory distress. She has no wheezes. She has no rales.  Abdominal: Soft. She exhibits no distension. There is no tenderness. There is no rebound and no guarding.  Musculoskeletal: Normal range of motion. She exhibits tenderness. She exhibits no edema.       Lumbar back: She exhibits tenderness.  She exhibits no bony tenderness.       Back:  Tenderness to palpation of bilateral biceps and forearms  Neurological: She is alert and oriented to person, place, and time.  Skin: Skin is warm and dry. No rash noted. No erythema.  Psychiatric: She has a normal mood and affect. Her behavior is normal.    ED Course  Procedures (including critical care time)  Labs Reviewed  CBC WITH DIFFERENTIAL - Abnormal; Notable for the following:    WBC 15.6 (*)    RBC 3.22 (*)    Hemoglobin 9.4 (*)    HCT 26.1 (*)    RDW 16.3 (*)    Platelets 571 (*)    All other components within normal limits  RETICULOCYTES - Abnormal; Notable for the following:    Retic Ct Pct 11.7 (*)    RBC. 3.22 (*)    Retic Count, Manual 376.7 (*)    All other components within normal limits  BASIC METABOLIC PANEL   No results found.   No diagnosis found.    MDM   Patient with a history of sickle cell disease who presents due to 2 sickle cell crisis today with lower back pain and bilateral arm pain. Symptoms initially started 6 days ago and was localized to her back which improved with Tylenol however pain has gradually continued to get worse and today she started to develop bilateral arm pain despite taking Tylenol. She is not take chronic pain medication and has not had a blood transfusion since she was an infant. Last sickle cell crisis was approximately 2 months ago. There is no chest pain, fever or joint swelling.  Will give patient IV pain control and check a CBC and reticulocyte count.  11:32 PM Mild leukocytosis and hb stable.  Elevated retic count.  Checked otu with Dr. Norlene Campbell for ongoing pain control.      Gwyneth Sprout, MD 03/03/13 [phone]

## 2013-03-03 NOTE — ED Notes (Signed)
2 unsuccessful IV attempts. R forearm, R hand.

## 2013-03-03 NOTE — ED Notes (Signed)
Patient reports current pain is consistent with her prior sickle cell crisis. Patient states she took tylenol at home with no relief. Patient states she does not have narcotics at home for pain.

## 2013-03-03 NOTE — ED Notes (Signed)
Patient resting at this time, in NAD. Patient requesting PO fluids.

## 2013-03-03 NOTE — ED Notes (Signed)
Patient states that she is having pain to her back and her bilateral arms

## 2013-03-04 MED ORDER — ONDANSETRON HCL 4 MG/2ML IJ SOLN
4.0000 mg | Freq: Once | INTRAMUSCULAR | Status: AC
  Administered 2013-03-04: 4 mg via INTRAVENOUS
  Filled 2013-03-04: qty 2

## 2013-03-04 MED ORDER — HYDROMORPHONE HCL PF 1 MG/ML IJ SOLN
1.0000 mg | Freq: Once | INTRAMUSCULAR | Status: AC
  Administered 2013-03-04: 1 mg via INTRAVENOUS
  Filled 2013-03-04: qty 1

## 2013-03-04 MED ORDER — ONDANSETRON 8 MG PO TBDP: 8.0000 mg | ORAL_TABLET | Freq: Three times a day (TID) | ORAL | Status: DC | PRN

## 2013-03-04 MED ORDER — OXYCODONE-ACETAMINOPHEN 5-325 MG PO TABS: 2.0000 | ORAL_TABLET | ORAL | Status: DC | PRN

## 2013-03-04 MED ORDER — KETOROLAC TROMETHAMINE 30 MG/ML IJ SOLN
30.0000 mg | Freq: Once | INTRAMUSCULAR | Status: AC
  Administered 2013-03-04: 30 mg via INTRAVENOUS
  Filled 2013-03-04: qty 1

## 2013-03-04 NOTE — ED Notes (Signed)
Writer entered patient room to update vital signs. Patient asleep, when aroused patient asked for additional ginger-ale, and rated pain as "felling better", when asked to quantify patient rates pain "about a 5"

## 2013-03-04 NOTE — ED Provider Notes (Signed)
Care assumed from Dr Anitra Lauth.  Pt with sickle cell disease, now with presumed crisis with pain of back, arms.  Pain now controlled after dilaudid and toradol.  Ready for d/c home.  Olivia Mackie, MD 03/04/13 [phone]

## 2013-03-11 ENCOUNTER — Emergency Department (HOSPITAL_COMMUNITY)
Admission: EM | Admit: 2013-03-11 | Discharge: 2013-03-11 | Disposition: A | Discharge status: Home / Self Care | Payer: PRIVATE HEALTH INSURANCE | Attending: Emergency Medicine | Admitting: Emergency Medicine

## 2013-03-11 ENCOUNTER — Encounter (HOSPITAL_COMMUNITY): Payer: Self-pay | Admitting: *Deleted

## 2013-03-11 DIAGNOSIS — D5700 Hb-SS disease with crisis, unspecified: Secondary | ICD-10-CM

## 2013-03-11 DIAGNOSIS — Z79899 Other long term (current) drug therapy: Secondary | ICD-10-CM | POA: Insufficient documentation

## 2013-03-11 DIAGNOSIS — Z8659 Personal history of other mental and behavioral disorders: Secondary | ICD-10-CM | POA: Insufficient documentation

## 2013-03-11 LAB — BASIC METABOLIC PANEL
BUN: 7 mg/dL (ref 6–23)
Calcium: 9.6 mg/dL (ref 8.4–10.5)
Chloride: 104 mEq/L (ref 96–112)
Creatinine, Ser: 0.55 mg/dL (ref 0.50–1.10)
GFR calc Af Amer: >90 mL/min (ref >90)
GFR calc non Af Amer: >90 mL/min (ref >90)

## 2013-03-11 LAB — CBC WITH DIFFERENTIAL/PLATELET
Basophils Absolute: 0 10*3/uL (ref 0.0–0.1)
Basophils Relative: 0 % (ref 0–1)
Eosinophils Absolute: 0.1 10*3/uL (ref 0.0–0.7)
HCT: 27 % — ABNORMAL LOW (ref 36.0–46.0)
MCH: 29.1 pg (ref 26.0–34.0)
MCHC: 35.2 g/dL (ref 30.0–36.0)
Monocytes Absolute: 1.3 10*3/uL — ABNORMAL HIGH (ref 0.1–1.0)
Monocytes Relative: 12 % (ref 3–12)
Neutro Abs: 5.2 10*3/uL (ref 1.7–7.7)
RDW: 17.1 % — ABNORMAL HIGH (ref 11.5–15.5)

## 2013-03-11 MED ORDER — HYDROMORPHONE HCL PF 1 MG/ML IJ SOLN
1.0000 mg | Freq: Once | INTRAMUSCULAR | Status: AC
  Administered 2013-03-11: 1 mg via INTRAVENOUS
  Filled 2013-03-11: qty 1

## 2013-03-11 MED ORDER — OXYCODONE-ACETAMINOPHEN 5-325 MG PO TABS: 2.0000 | ORAL_TABLET | ORAL | Status: DC | PRN

## 2013-03-11 MED ORDER — ONDANSETRON HCL 4 MG/2ML IJ SOLN
4.0000 mg | Freq: Once | INTRAMUSCULAR | Status: AC
  Administered 2013-03-11: 4 mg via INTRAVENOUS
  Filled 2013-03-11: qty 2

## 2013-03-11 MED ORDER — PROMETHAZINE HCL 25 MG PO TABS: 25.0000 mg | ORAL_TABLET | Freq: Four times a day (QID) | ORAL | Status: DC | PRN

## 2013-03-11 MED ORDER — SODIUM CHLORIDE 0.9 % IV BOLUS (SEPSIS)
1000.0000 mL | Freq: Once | INTRAVENOUS | Status: AC
  Administered 2013-03-11: 1000 mL via INTRAVENOUS

## 2013-03-11 NOTE — ED Provider Notes (Signed)
History     CSN: [id number]  Arrival date & time 03/11/13  0757   First MD Initiated Contact with Patient 03/11/13 [phone]      No chief complaint on file.   (Consider location/radiation/quality/duration/timing/severity/associated sxs/prior treatment) HPI .Marland Kitchen... patient with known sickle cell anemia presents with sharp pain crisis in her back and chest for several days. She has taken opiates at home with no relief.  Nothing makes symptoms better or worse. Severity is moderate. No radiation of pain.   Past Medical History  Diagnosis Date  . Sickle cell crisis   . Depression   . Headache   . Abortion     05/2012    Past Surgical History  Procedure Laterality Date  . Splenectomy    . Splenectomy, total      Family History  Problem Relation Age of Onset  . Hypertension Mother   . Sickle cell anemia Sister   . Kidney disease Sister   . Arthritis Sister   . Sickle cell anemia Sister   . Sickle cell trait Sister   . Heart disease Maternal Aunt   . Heart disease Maternal Uncle     History  Substance Use Topics  . Smoking status: Never Smoker   . Smokeless tobacco: Never Used  . Alcohol Use: No    OB History   Grav Para Term Preterm Abortions TAB SAB Ect Mult Living                  Review of Systems  All other systems reviewed and are negative.    Allergies  Other  Home Medications   Current Outpatient Rx  Name  Route  Sig  Dispense  Refill  . acetaminophen (TYLENOL) 500 MG tablet   Oral   Take 1,000 mg by mouth every 6 (six) hours as needed for pain.         . cholecalciferol (VITAMIN D) 1000 UNITS tablet   Oral   Take 1,000 Units by mouth daily.         . ondansetron (ZOFRAN ODT) 8 MG disintegrating tablet   Oral   Take 1 tablet (8 mg total) by mouth every 8 (eight) hours as needed for nausea.   20 tablet   0   . oxyCODONE-acetaminophen (PERCOCET/ROXICET) 5-325 MG per tablet   Oral   Take 2 tablets by mouth every 4 (four) hours as needed for  pain.   20 tablet   0   . oxyCODONE-acetaminophen (PERCOCET) 5-325 MG per tablet   Oral   Take 2 tablets by mouth every 4 (four) hours as needed for pain.   20 tablet   0   . oxyCODONE-acetaminophen (PERCOCET) 5-325 MG per tablet   Oral   Take 2 tablets by mouth every 4 (four) hours as needed for pain.   20 tablet   0   . promethazine (PHENERGAN) 25 MG tablet   Oral   Take 1 tablet (25 mg total) by mouth every 6 (six) hours as needed for nausea.   15 tablet   0   . promethazine (PHENERGAN) 25 MG tablet   Oral   Take 1 tablet (25 mg total) by mouth every 6 (six) hours as needed for nausea.   15 tablet   0     BP 115/59  Pulse 76  Temp(Src) 97.9 F (36.6 C) (Oral)  Resp 18  SpO2 100%  LMP 02/08/2013  Physical Exam  Nursing note and vitals reviewed. Constitutional: She is  oriented to person, place, and time. She appears well-developed and well-nourished.  HENT:  Head: Normocephalic and atraumatic.  Eyes: Conjunctivae and EOM are normal. Pupils are equal, round, and reactive to light.  Neck: Normal range of motion. Neck supple.  Cardiovascular: Normal rate, regular rhythm and normal heart sounds.   Pulmonary/Chest: Effort normal and breath sounds normal.  Abdominal: Soft. Bowel sounds are normal.  Musculoskeletal: Normal range of motion.  Neurological: She is alert and oriented to person, place, and time.  Skin: Skin is warm and dry.  Psychiatric: She has a normal mood and affect.    ED Course  Procedures (including critical care time)  Labs Reviewed  CBC WITH DIFFERENTIAL - Abnormal; Notable for the following:    WBC 10.9 (*)    RBC 3.26 (*)    Hemoglobin 9.5 (*)    HCT 27.0 (*)    RDW 17.1 (*)    Platelets 612 (*)    Lymphs Abs 4.3 (*)    Monocytes Absolute 1.3 (*)    All other components within normal limits  BASIC METABOLIC PANEL   No results found.   1. Sickle cell anemia with crisis       MDM  Patient feels much better after IV  hydration and pain management. Discharge meds Percocet #20 and Phenergan 25 mg #15       Donnetta Hutching, MD 03/11/13 [phone]

## 2013-03-26 ENCOUNTER — Emergency Department (HOSPITAL_COMMUNITY)
Admission: EM | Admit: 2013-03-26 | Discharge: 2013-03-26 | Disposition: A | Discharge status: Home / Self Care | Payer: Self-pay | Attending: Emergency Medicine | Admitting: Emergency Medicine

## 2013-03-26 DIAGNOSIS — E669 Obesity, unspecified: Secondary | ICD-10-CM | POA: Insufficient documentation

## 2013-03-26 DIAGNOSIS — Z8659 Personal history of other mental and behavioral disorders: Secondary | ICD-10-CM | POA: Insufficient documentation

## 2013-03-26 DIAGNOSIS — Z8669 Personal history of other diseases of the nervous system and sense organs: Secondary | ICD-10-CM | POA: Insufficient documentation

## 2013-03-26 DIAGNOSIS — Z3202 Encounter for pregnancy test, result negative: Secondary | ICD-10-CM | POA: Insufficient documentation

## 2013-03-26 DIAGNOSIS — D5700 Hb-SS disease with crisis, unspecified: Secondary | ICD-10-CM | POA: Insufficient documentation

## 2013-03-26 LAB — CBC WITH DIFFERENTIAL/PLATELET
Basophils Absolute: 0.1 10*3/uL (ref 0.0–0.1)
Basophils Relative: 0 % (ref 0–1)
HCT: 29.9 % — ABNORMAL LOW (ref 36.0–46.0)
Lymphocytes Relative: 42 % (ref 12–46)
MCHC: 34.1 g/dL (ref 30.0–36.0)
Monocytes Absolute: 1.4 10*3/uL — ABNORMAL HIGH (ref 0.1–1.0)
Neutro Abs: 6.4 10*3/uL (ref 1.7–7.7)
Neutrophils Relative %: 47 % (ref 43–77)
Platelets: 676 10*3/uL — ABNORMAL HIGH (ref 150–400)
RDW: 15.6 % — ABNORMAL HIGH (ref 11.5–15.5)
WBC: 13.6 10*3/uL — ABNORMAL HIGH (ref 4.0–10.5)

## 2013-03-26 LAB — RETICULOCYTES
RBC.: 3.65 MIL/uL — ABNORMAL LOW (ref 3.87–5.11)
Retic Count, Absolute: 317.6 10*3/uL — ABNORMAL HIGH (ref 19.0–186.0)
Retic Ct Pct: 8.7 % — ABNORMAL HIGH (ref 0.4–3.1)

## 2013-03-26 LAB — POCT I-STAT, CHEM 8
Calcium, Ion: 1.31 mmol/L — ABNORMAL HIGH (ref 1.12–1.23)
Glucose, Bld: 96 mg/dL (ref 70–99)
HCT: 36 % (ref 36.0–46.0)
Hemoglobin: 12.2 g/dL (ref 12.0–15.0)
TCO2: 27 mmol/L (ref 0–100)

## 2013-03-26 LAB — POCT PREGNANCY, URINE: Preg Test, Ur: NEGATIVE

## 2013-03-26 MED ORDER — OXYCODONE-ACETAMINOPHEN 5-325 MG PO TABS
1.0000 | ORAL_TABLET | Freq: Once | ORAL | Status: AC
  Administered 2013-03-26: 1 via ORAL
  Filled 2013-03-26: qty 1

## 2013-03-26 MED ORDER — MORPHINE SULFATE 4 MG/ML IJ SOLN
4.0000 mg | Freq: Once | INTRAMUSCULAR | Status: AC
  Administered 2013-03-26: 4 mg via INTRAVENOUS
  Filled 2013-03-26: qty 1

## 2013-03-26 MED ORDER — ONDANSETRON HCL 4 MG/2ML IJ SOLN
4.0000 mg | Freq: Once | INTRAMUSCULAR | Status: AC
  Administered 2013-03-26: 4 mg via INTRAVENOUS
  Filled 2013-03-26: qty 2

## 2013-03-26 MED ORDER — SODIUM CHLORIDE 0.9 % IV BOLUS (SEPSIS)
1000.0000 mL | Freq: Once | INTRAVENOUS | Status: AC
  Administered 2013-03-26: 1000 mL via INTRAVENOUS

## 2013-03-26 MED ORDER — KETOROLAC TROMETHAMINE 30 MG/ML IJ SOLN
30.0000 mg | Freq: Once | INTRAMUSCULAR | Status: AC
  Administered 2013-03-26: 30 mg via INTRAVENOUS
  Filled 2013-03-26: qty 1

## 2013-03-26 MED ORDER — OXYCODONE-ACETAMINOPHEN 5-325 MG PO TABS: 1.0000 | ORAL_TABLET | ORAL | Status: DC | PRN

## 2013-03-26 MED ORDER — ONDANSETRON 8 MG PO TBDP: 8.0000 mg | ORAL_TABLET | Freq: Three times a day (TID) | ORAL | Status: DC | PRN

## 2013-03-26 NOTE — ED Notes (Signed)
Patient is alert and oriented x3.  She is complaining of chest pain that started yesterday that she rates her pain at 9 of 10 without nausea.  She states that she does have a history of this chest pain.

## 2013-03-26 NOTE — ED Provider Notes (Signed)
History     CSN: [id number]  Arrival date & time 03/26/13  0203   First MD Initiated Contact with Patient 03/26/13 0224      Chief Complaint  Patient presents with  . Chest Pain    (Consider location/radiation/quality/duration/timing/severity/associated sxs/prior treatment) HPI Comments: Patient with SSD now with 24 hours of vague chest discomfort has been taking Tylenol PM X2 with out relief. 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 female presenting with chest pain. The history is provided by the patient.  Chest Pain Pain location:  L chest and R chest Pain quality: aching   Pain radiates to:  Does not radiate Pain radiates to the back: yes   Pain severity:  Moderate Onset quality:  Gradual Duration:  24 hours Timing:  Constant Chronicity:  Recurrent Context: at rest and trauma   Context: not breathing, no drug use, not lifting, no movement and no stress   Relieved by:  Nothing Worsened by:  Nothing tried Ineffective treatments: tylenol PM. Associated symptoms: no abdominal pain, no cough, no fever, no shortness of breath and not vomiting   Risk factors: obesity   Risk factors comment:  Sickle cell disease    Past Medical History  Diagnosis Date  . Sickle cell crisis   . Depression   . Headache   . Abortion     05/2012    Past Surgical History  Procedure Laterality Date  . Splenectomy    . Splenectomy, total      Family History  Problem Relation Age of Onset  . Hypertension Mother   . Sickle cell anemia Sister   . Kidney disease Sister   . Arthritis Sister   . Sickle cell anemia Sister   . Sickle cell trait Sister   . Heart disease Maternal Aunt   . Heart disease Maternal Uncle     History  Substance Use Topics  . Smoking status: Never Smoker   . Smokeless tobacco: Never Used  . Alcohol Use: No    OB History   Grav  Para Term Preterm Abortions TAB SAB Ect Mult Living                  Review of Systems  Constitutional: Negative for fever and chills.  Respiratory: Negative for cough and shortness of breath.   Cardiovascular: Positive for chest pain.  Gastrointestinal: Negative for vomiting and abdominal pain.  All other systems reviewed and are negative.    Allergies  Other  Home Medications   Current Outpatient Rx  Name  Route  Sig  Dispense  Refill  . acetaminophen (TYLENOL) 500 MG tablet   Oral   Take 1,000 mg by mouth every 6 (six) hours as needed for pain.         . cholecalciferol (VITAMIN D) 1000 UNITS tablet   Oral   Take 1,000 Units by mouth daily.         . ondansetron (ZOFRAN ODT) 8 MG disintegrating tablet   Oral   Take 1 tablet (8 mg total) by mouth every 8 (eight) hours as needed for nausea.   20 tablet   0   . oxyCODONE-acetaminophen (PERCOCET) 5-325 MG per tablet   Oral   Take 2 tablets by mouth every 4 (four) hours as needed for pain.   20 tablet  0   . promethazine (PHENERGAN) 25 MG tablet   Oral   Take 1 tablet (25 mg total) by mouth every 6 (six) hours as needed for nausea.   15 tablet   0     BP 121/65  Pulse 83  Temp(Src) 98.5 F (36.9 C) (Oral)  Resp 26  SpO2 99%  LMP 03/19/2013  Physical Exam  ED Course  Procedures (including critical care time)  Labs Reviewed  CBC WITH DIFFERENTIAL  RETICULOCYTES   No results found.   No diagnosis found. ED ECG REPORT   Date: 03/26/2013  EKG Time: 5:41 AM  Rate: 78  Rhythm: normal sinus rhythm,  unchanged from previous tracings  Axis: normal  Intervals:none  ST&T Change: none  Narrative Interpretation: normal              MDM   PAtient stets her pain is better will give additional liter NS additional IV morphine than switch her to Percocet . Patient agrees with this plan         Arman Filter, NP 03/26/13 2051

## 2013-03-28 NOTE — ED Provider Notes (Signed)
Medical screening examination/treatment/procedure(s) were conducted as a shared visit with non-physician practitioner(s) and myself.  I personally evaluated the patient during the encounter  Lyanne Co, MD 03/28/13 [phone]

## 2013-04-22 ENCOUNTER — Encounter (HOSPITAL_COMMUNITY): Payer: Self-pay | Admitting: Emergency Medicine

## 2013-04-22 ENCOUNTER — Emergency Department (HOSPITAL_COMMUNITY)
Admission: EM | Admit: 2013-04-22 | Discharge: 2013-04-22 | Disposition: A | Discharge status: Home / Self Care | Payer: PRIVATE HEALTH INSURANCE | Attending: Emergency Medicine | Admitting: Emergency Medicine

## 2013-04-22 DIAGNOSIS — J020 Streptococcal pharyngitis: Secondary | ICD-10-CM | POA: Insufficient documentation

## 2013-04-22 DIAGNOSIS — R059 Cough, unspecified: Secondary | ICD-10-CM | POA: Insufficient documentation

## 2013-04-22 DIAGNOSIS — R509 Fever, unspecified: Secondary | ICD-10-CM | POA: Insufficient documentation

## 2013-04-22 DIAGNOSIS — R599 Enlarged lymph nodes, unspecified: Secondary | ICD-10-CM | POA: Insufficient documentation

## 2013-04-22 DIAGNOSIS — R05 Cough: Secondary | ICD-10-CM | POA: Insufficient documentation

## 2013-04-22 DIAGNOSIS — Z79899 Other long term (current) drug therapy: Secondary | ICD-10-CM | POA: Insufficient documentation

## 2013-04-22 DIAGNOSIS — M545 Low back pain, unspecified: Secondary | ICD-10-CM | POA: Insufficient documentation

## 2013-04-22 DIAGNOSIS — R070 Pain in throat: Secondary | ICD-10-CM | POA: Insufficient documentation

## 2013-04-22 DIAGNOSIS — M549 Dorsalgia, unspecified: Secondary | ICD-10-CM | POA: Insufficient documentation

## 2013-04-22 DIAGNOSIS — Z8659 Personal history of other mental and behavioral disorders: Secondary | ICD-10-CM | POA: Insufficient documentation

## 2013-04-22 DIAGNOSIS — Z862 Personal history of diseases of the blood and blood-forming organs and certain disorders involving the immune mechanism: Secondary | ICD-10-CM | POA: Insufficient documentation

## 2013-04-22 DIAGNOSIS — R1310 Dysphagia, unspecified: Secondary | ICD-10-CM | POA: Insufficient documentation

## 2013-04-22 MED ORDER — PENICILLIN G BENZATHINE 1200000 UNIT/2ML IM SUSP
1.2000 10*6.[IU] | Freq: Once | INTRAMUSCULAR | Status: AC
  Administered 2013-04-22: 1.2 10*6.[IU] via INTRAMUSCULAR
  Filled 2013-04-22: qty 2

## 2013-04-22 MED ORDER — NAPROXEN 500 MG PO TABS: 500.0000 mg | ORAL_TABLET | Freq: Two times a day (BID) | ORAL | Status: DC

## 2013-04-22 NOTE — ED Provider Notes (Signed)
Medical screening examination/treatment/procedure(s) were performed by non-physician practitioner and as supervising physician I was immediately available for consultation/collaboration.   Arrick Dutton H Madalaine Portier, MD 04/22/13 1540 

## 2013-04-22 NOTE — ED Provider Notes (Signed)
History     CSN: [id number]  Arrival date & time 04/22/13  1105   First MD Initiated Contact with Patient 04/22/13 1118      Chief Complaint  Patient presents with  . Sore Throat  . Cough    (Consider location/radiation/quality/duration/timing/severity/associated sxs/prior treatment) HPI Comments: Patient is a 22 year old female who presents with a 5 day history of sore throat. Patient reports gradual onset and progressively worsening sharp, severe throat pain. The pain is constant and made worse with swallowing. The pain is localized to the patient's throat and equal on both sides. Nothing alleviates the pain. The patient has not tried anything for symptom relief. Patient reports associated subjective fever, cervical adenopathy, and non productive cough. Patient denies headache, visual changes, sinus congestion, difficulty breathing, chest pain, SOB, abdominal pain, NVD.   Patient also complains of lower back pain that started a few weeks ago. The pain is aching and severe at times and does not radiate. 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 Movement makes the pain worse. Nothing makes the pain better. Patient has not tried anything for pain. No associated symptoms. No saddles paresthesias or bladder/bowel incontinence.       Patient is a 22 y.o. female presenting with pharyngitis and cough.  Sore Throat Associated symptoms include coughing and a sore throat.  Cough Associated symptoms: sore throat     Past Medical History  Diagnosis Date  . Sickle cell crisis   . Depression   . Headache(784.0)   . Abortion     05/2012    Past Surgical History  Procedure Laterality Date  . Splenectomy    . Splenectomy, total      Family History  Problem Relation Age of Onset  . Hypertension Mother   . Sickle cell anemia Sister   . Kidney disease Sister   . Arthritis Sister   . Sickle cell anemia Sister   . Sickle cell trait Sister   . Heart  disease Maternal Aunt   . Heart disease Maternal Uncle     History  Substance Use Topics  . Smoking status: Never Smoker   . Smokeless tobacco: Never Used  . Alcohol Use: No    OB History   Grav Para Term Preterm Abortions TAB SAB Ect Mult Living                  Review of Systems  HENT: Positive for sore throat.   Respiratory: Positive for cough.   Musculoskeletal: Positive for back pain.  All other systems reviewed and are negative.    Allergies  Other  Home Medications   Current Outpatient Rx  Name  Route  Sig  Dispense  Refill  . acetaminophen (TYLENOL) 500 MG tablet   Oral   Take 1,000 mg by mouth every 6 (six) hours as needed for pain.         . cholecalciferol (VITAMIN D) 1000 UNITS tablet   Oral   Take 1,000 Units by mouth daily.         . ondansetron (ZOFRAN ODT) 8 MG disintegrating tablet   Oral   Take 1 tablet (8 mg total) by mouth every 8 (eight) hours as needed for nausea.   20 tablet   0   . oxyCODONE-acetaminophen (PERCOCET) 5-325 MG per tablet   Oral   Take 1 tablet by mouth every 4 (four) hours as needed for pain.   20 tablet  0   . promethazine (PHENERGAN) 25 MG tablet   Oral   Take 1 tablet (25 mg total) by mouth every 6 (six) hours as needed for nausea.   15 tablet   0     BP 120/80  Pulse 103  Temp(Src) 98.4 F (36.9 C) (Oral)  Resp 16  SpO2 100%  LMP 03/19/2013  Physical Exam  Nursing note and vitals reviewed. Constitutional: She is oriented to person, place, and time. She appears well-developed and well-nourished. No distress.  HENT:  Head: Normocephalic and atraumatic.  Mouth/Throat: Oropharyngeal exudate present.  Bilateral tonsillar edema and erythema with tonsillar exudate.   Eyes: Conjunctivae and EOM are normal. Pupils are equal, round, and reactive to light.  Neck: Normal range of motion.  Cardiovascular: Normal rate and regular rhythm.  Exam reveals no gallop and no friction rub.   No murmur  heard. Pulmonary/Chest: Effort normal and breath sounds normal. She has no wheezes. She has no rales. She exhibits no tenderness.  Abdominal: Soft. There is no tenderness.  Musculoskeletal: Normal range of motion.  Paraspinal lumbar tenderness to palpation. No midline spine tenderness to palpation.   Lymphadenopathy:    She has cervical adenopathy.  Neurological: She is alert and oriented to person, place, and time.  Speech is goal-oriented. Moves limbs without ataxia.   Skin: Skin is warm and dry.  Psychiatric: She has a normal mood and affect. Her behavior is normal.    ED Course  Procedures (including critical care time)  Labs Reviewed  RAPID STREP SCREEN - Abnormal; Notable for the following:    Streptococcus, Group A Screen (Direct) POSITIVE (*)    All other components within normal limits   No results found.   1. Strep pharyngitis   2. Back pain       MDM  12:01 PM Rapid strep pending.   12:08 PM Patient will be treated for strep based on clinical impression. Patient will have Naproxen for back pain and instructed to apply heat. Patient instructed to return with worsening or concerning symptoms. Vitals stable and patient afebrile.      Emilia Beck, PA-C 04/22/13 1228

## 2013-04-22 NOTE — ED Notes (Addendum)
Pt reports that she has had cough with productive yellow sputum and sore throat x4 days. Pt denies fever, N/V/D, body aches. Pt adds that she is having some mid back pain, 7/10. Pt has hx of SCC but does not feel that this is a crisis.  Pt is A&O and in NAD

## 2013-06-10 ENCOUNTER — Emergency Department (HOSPITAL_COMMUNITY): Payer: PRIVATE HEALTH INSURANCE

## 2013-06-10 ENCOUNTER — Encounter (HOSPITAL_COMMUNITY): Payer: Self-pay

## 2013-06-10 ENCOUNTER — Encounter (HOSPITAL_COMMUNITY): Payer: Self-pay | Admitting: Emergency Medicine

## 2013-06-10 ENCOUNTER — Emergency Department (HOSPITAL_COMMUNITY)
Admission: EM | Admit: 2013-06-10 | Discharge: 2013-06-10 | Disposition: A | Discharge status: Home / Self Care | Payer: PRIVATE HEALTH INSURANCE | Attending: Emergency Medicine | Admitting: Emergency Medicine

## 2013-06-10 DIAGNOSIS — IMO0001 Reserved for inherently not codable concepts without codable children: Secondary | ICD-10-CM | POA: Insufficient documentation

## 2013-06-10 DIAGNOSIS — M2550 Pain in unspecified joint: Secondary | ICD-10-CM | POA: Insufficient documentation

## 2013-06-10 DIAGNOSIS — M549 Dorsalgia, unspecified: Secondary | ICD-10-CM | POA: Insufficient documentation

## 2013-06-10 DIAGNOSIS — R109 Unspecified abdominal pain: Secondary | ICD-10-CM | POA: Insufficient documentation

## 2013-06-10 DIAGNOSIS — Z3202 Encounter for pregnancy test, result negative: Secondary | ICD-10-CM | POA: Insufficient documentation

## 2013-06-10 DIAGNOSIS — D5700 Hb-SS disease with crisis, unspecified: Secondary | ICD-10-CM | POA: Insufficient documentation

## 2013-06-10 DIAGNOSIS — M25519 Pain in unspecified shoulder: Secondary | ICD-10-CM | POA: Insufficient documentation

## 2013-06-10 DIAGNOSIS — Z8659 Personal history of other mental and behavioral disorders: Secondary | ICD-10-CM | POA: Insufficient documentation

## 2013-06-10 DIAGNOSIS — R0602 Shortness of breath: Secondary | ICD-10-CM | POA: Insufficient documentation

## 2013-06-10 LAB — COMPREHENSIVE METABOLIC PANEL
ALT: 15 U/L (ref 0–35)
AST: 41 U/L — ABNORMAL HIGH (ref 0–37)
Albumin: 4 g/dL (ref 3.5–5.2)
Calcium: 9.3 mg/dL (ref 8.4–10.5)
GFR calc Af Amer: >90 mL/min (ref >90)
Sodium: 140 mEq/L (ref 135–145)
Total Protein: 7.9 g/dL (ref 6.0–8.3)

## 2013-06-10 LAB — URINALYSIS, ROUTINE W REFLEX MICROSCOPIC
Bilirubin Urine: NEGATIVE
Glucose, UA: NEGATIVE mg/dL
Hgb urine dipstick: NEGATIVE
Ketones, ur: NEGATIVE mg/dL
Nitrite: NEGATIVE
Specific Gravity, Urine: 1.017 (ref 1.005–1.030)
Urobilinogen, UA: 1 mg/dL (ref 0.0–1.0)
pH: 6 (ref 5.0–8.0)
pH: 6 (ref 5.0–8.0)

## 2013-06-10 LAB — POCT I-STAT, CHEM 8
BUN: 9 mg/dL (ref 6–23)
Hemoglobin: 10.2 g/dL — ABNORMAL LOW (ref 12.0–15.0)
Potassium: 3.5 mEq/L (ref 3.5–5.1)
Sodium: 143 mEq/L (ref 135–145)
TCO2: 22 mmol/L (ref 0–100)

## 2013-06-10 LAB — URINE MICROSCOPIC-ADD ON

## 2013-06-10 LAB — CBC WITH DIFFERENTIAL/PLATELET
Basophils Absolute: 0.2 10*3/uL — ABNORMAL HIGH (ref 0.0–0.1)
Basophils Relative: 1 % (ref 0–1)
Eosinophils Absolute: 0.2 10*3/uL (ref 0.0–0.7)
Eosinophils Relative: 1 % (ref 0–5)
HCT: 25.2 % — ABNORMAL LOW (ref 36.0–46.0)
Hemoglobin: 9 g/dL — ABNORMAL LOW (ref 12.0–15.0)
Lymphocytes Relative: 16 % (ref 12–46)
Lymphs Abs: 6.5 10*3/uL — ABNORMAL HIGH (ref 0.7–4.0)
MCH: 29.5 pg (ref 26.0–34.0)
MCHC: 35.7 g/dL (ref 30.0–36.0)
MCHC: 36.2 g/dL — ABNORMAL HIGH (ref 30.0–36.0)
MCV: 82.6 fL (ref 78.0–100.0)
Neutro Abs: 6.2 10*3/uL (ref 1.7–7.7)
Neutrophils Relative %: 43 % (ref 43–77)
Neutrophils Relative %: 72 % (ref 43–77)
RBC: 3.05 MIL/uL — ABNORMAL LOW (ref 3.87–5.11)
RDW: 17.2 % — ABNORMAL HIGH (ref 11.5–15.5)
WBC: 19.6 10*3/uL — ABNORMAL HIGH (ref 4.0–10.5)
nRBC: 1 /100 WBC — ABNORMAL HIGH

## 2013-06-10 LAB — PREGNANCY, URINE: Preg Test, Ur: NEGATIVE

## 2013-06-10 LAB — RETICULOCYTES: Retic Count, Absolute: 349.7 10*3/uL — ABNORMAL HIGH (ref 19.0–186.0)

## 2013-06-10 MED ORDER — HYDROMORPHONE HCL PF 2 MG/ML IJ SOLN
2.0000 mg | Freq: Once | INTRAMUSCULAR | Status: AC
  Administered 2013-06-10: 2 mg via INTRAVENOUS
  Filled 2013-06-10: qty 1

## 2013-06-10 MED ORDER — ONDANSETRON HCL 4 MG/2ML IJ SOLN
4.0000 mg | Freq: Once | INTRAMUSCULAR | Status: AC
  Administered 2013-06-10: 4 mg via INTRAVENOUS
  Filled 2013-06-10: qty 2

## 2013-06-10 MED ORDER — HYDROMORPHONE HCL PF 1 MG/ML IJ SOLN
1.0000 mg | Freq: Once | INTRAMUSCULAR | Status: AC
  Administered 2013-06-10: 1 mg via INTRAVENOUS
  Filled 2013-06-10: qty 1

## 2013-06-10 MED ORDER — DIPHENHYDRAMINE HCL 25 MG PO CAPS
25.0000 mg | ORAL_CAPSULE | Freq: Once | ORAL | Status: AC
  Administered 2013-06-10: 25 mg via ORAL
  Filled 2013-06-10: qty 1

## 2013-06-10 MED ORDER — SODIUM CHLORIDE 0.9 % IV BOLUS (SEPSIS)
1000.0000 mL | Freq: Once | INTRAVENOUS | Status: AC
  Administered 2013-06-10: 1000 mL via INTRAVENOUS

## 2013-06-10 MED ORDER — HYDROCODONE-ACETAMINOPHEN 5-325 MG PO TABS: 1.0000 | ORAL_TABLET | ORAL | Status: DC | PRN

## 2013-06-10 MED ORDER — DIPHENHYDRAMINE HCL 50 MG/ML IJ SOLN
25.0000 mg | Freq: Once | INTRAMUSCULAR | Status: AC
  Administered 2013-06-10: 25 mg via INTRAVENOUS
  Filled 2013-06-10: qty 1

## 2013-06-10 MED ORDER — DIPHENHYDRAMINE HCL 50 MG/ML IJ SOLN
25.0000 mg | Freq: Once | INTRAMUSCULAR | Status: AC
  Administered 2013-06-10: 25 mg via INTRAVENOUS

## 2013-06-10 NOTE — ED Notes (Signed)
Pt c/o back pain x 1.5 weeks, pt states she has been using tylenol PM for pain. Pt states this feels like her typical SS pain

## 2013-06-10 NOTE — ED Provider Notes (Signed)
CSN: [id number]     Arrival date & time 06/10/13  1529 History     First MD Initiated Contact with Patient 06/10/13 1530     Chief Complaint  Patient presents with  . Sickle Cell Pain Crisis   (Consider location/radiation/quality/duration/timing/severity/associated sxs/prior Treatment) HPI Comments: 22 year old female with a past medical history of sickle cell anemia and depression presents back to the emergency department after being discharged at 4:22 this morning complaining of worsening sickle cell pain. Patient states at time of discharge or odor this morning she was feeling better, however when she woke up around 11:00 this morning her pain returned. Pain is from her shoulders and down throughout her entire body and his "typical sickle cell pain" rated 10/10. Tried taking Tylenol PM without relief. Admits to associated shortness of breath which was not present earlier this morning. Denies chest pain, nausea or vomiting. No fever or chills. Denies any recent illness. PCP was Dr. August Saucer, however now she is trying to establish care with Dr. Ronne Binning. She did not try calling him today.  Patient is a 22 y.o. female presenting with sickle cell pain. The history is provided by the patient.  Sickle Cell Pain Crisis Associated symptoms: shortness of breath   Associated symptoms: no chest pain, no cough, no fever, no nausea and no vomiting     Past Medical History  Diagnosis Date  . Sickle cell crisis   . Depression   . Headache(784.0)   . Abortion     05/2012   Past Surgical History  Procedure Laterality Date  . Splenectomy    . Splenectomy, total     Family History  Problem Relation Age of Onset  . Hypertension Mother   . Sickle cell anemia Sister   . Kidney disease Sister   . Arthritis Sister   . Sickle cell anemia Sister   . Sickle cell trait Sister   . Heart disease Maternal Aunt   . Heart disease Maternal Uncle    History  Substance Use Topics  . Smoking status: Never  Smoker   . Smokeless tobacco: Never Used  . Alcohol Use: No   OB History   Grav Para Term Preterm Abortions TAB SAB Ect Mult Living                 Review of Systems  Constitutional: Negative for fever and chills.  Respiratory: Positive for shortness of breath. Negative for cough.   Cardiovascular: Negative for chest pain.  Gastrointestinal: Positive for abdominal pain. Negative for nausea and vomiting.  Musculoskeletal: Positive for myalgias and arthralgias.  All other systems reviewed and are negative.    Allergies  Other  Home Medications   Current Outpatient Rx  Name  Route  Sig  Dispense  Refill  . diphenhydramine-acetaminophen (TYLENOL PM) 25-500 MG TABS   Oral   Take 1 tablet by mouth at bedtime as needed (pain/sleep).          BP 129/89  Pulse 91  Temp(Src) 98.7 F (37.1 C) (Oral)  Resp 20  SpO2 98%  LMP 06/04/2013 Physical Exam  Nursing note and vitals reviewed. Constitutional: She is oriented to person, place, and time. She appears well-developed and well-nourished. No distress.  HENT:  Head: Normocephalic and atraumatic.  Mouth/Throat: Oropharynx is clear and moist.  Eyes: Conjunctivae and EOM are normal. Pupils are equal, round, and reactive to light.  Neck: Normal range of motion. Neck supple.  Cardiovascular: Normal rate, regular rhythm, normal heart sounds and intact  distal pulses.   Pulmonary/Chest: Effort normal and breath sounds normal.  Abdominal: Soft. Bowel sounds are normal. There is no tenderness.  Musculoskeletal: Normal range of motion. She exhibits no edema.  Generalized tenderness of entire body from shoulders down to the toes, no bony tenderness. No overlying erythema or edema.  Neurological: She is alert and oriented to person, place, and time.  Skin: Skin is warm and dry. She is not diaphoretic.  Psychiatric: She has a normal mood and affect. Her behavior is normal.    ED Course   Procedures (including critical care  time)  Labs Reviewed  CBC WITH DIFFERENTIAL - Abnormal; Notable for the following:    WBC 19.6 (*)    RBC 3.15 (*)    Hemoglobin 9.2 (*)    HCT 25.4 (*)    MCHC 36.2 (*)    RDW 17.2 (*)    Platelets 602 (*)    Neutro Abs 14.1 (*)    Monocytes Absolute 2.0 (*)    Basophils Absolute 0.2 (*)    All other components within normal limits  COMPREHENSIVE METABOLIC PANEL - Abnormal; Notable for the following:    AST 41 (*)    Total Bilirubin 2.2 (*)    All other components within normal limits  RETICULOCYTES - Abnormal; Notable for the following:    Retic Ct Pct 11.1 (*)    RBC. 3.15 (*)    Retic Count, Manual 349.7 (*)    All other components within normal limits  URINALYSIS, ROUTINE W REFLEX MICROSCOPIC   No results found. 1. Sickle cell pain crisis     MDM  Patient with sickle cell crisis. She is afebrile with normal vital signs. Labs pending- CBC, CMP and reticulocytes. CXR due to SOB. No chest pain present.  IV fluids, pain control. 5:40 PM Patient reports no improvement of pain. Leukocytosis of 19.6. CXR without any acute abnormality. Will check UA. Repeat pain meds, fluids. Vitals continue to be stable. NAD. Resting comfortably on exam bed. 6:39 PM Patient reports pain is beginning to improve. No narcotic pain meds at home- will give 10 vicodin. Aware this medication needs to be managed by PCP. She is going to establish care with Dr. Ronne Binning.  Trevor Mace, PA-C 06/10/13 1841  Medical screening examination/treatment/procedure(s) were performed by non-physician practitioner and as supervising physician I was immediately available for consultation/collaboration.  Enid Skeens, MD 06/10/13 2337

## 2013-06-10 NOTE — ED Provider Notes (Signed)
CSN: [id number]     Arrival date & time 06/10/13  0058 History     First MD Initiated Contact with Patient 06/10/13 0123     Chief Complaint  Patient presents with  . Sickle Cell Pain Crisis   (Consider location/radiation/quality/duration/timing/severity/associated sxs/prior Treatment) HPI HX per PT - has SCA and states she is having a sickle cell pain crisis. Having her typical pains in her shoulders and down her back, no CP or SOB. No F/C. No N/V/D. No rash. No weakness or numbness. She normally takes tylenol and this is not helping her at home. No trauma.   Past Medical History  Diagnosis Date  . Sickle cell crisis   . Depression   . Headache(784.0)   . Abortion     05/2012   Past Surgical History  Procedure Laterality Date  . Splenectomy    . Splenectomy, total     Family History  Problem Relation Age of Onset  . Hypertension Mother   . Sickle cell anemia Sister   . Kidney disease Sister   . Arthritis Sister   . Sickle cell anemia Sister   . Sickle cell trait Sister   . Heart disease Maternal Aunt   . Heart disease Maternal Uncle    History  Substance Use Topics  . Smoking status: Never Smoker   . Smokeless tobacco: Never Used  . Alcohol Use: No   OB History   Grav Para Term Preterm Abortions TAB SAB Ect Mult Living                 Review of Systems  Constitutional: Negative for fever and chills.  HENT: Negative for neck pain and neck stiffness.   Eyes: Negative for pain.  Respiratory: Negative for shortness of breath.   Cardiovascular: Negative for chest pain.  Gastrointestinal: Negative for vomiting and abdominal pain.  Genitourinary: Negative for dysuria.  Musculoskeletal: Positive for back pain. Negative for joint swelling.  Skin: Negative for rash.  Neurological: Negative for headaches.  All other systems reviewed and are negative.    Allergies  Other  Home Medications   Current Outpatient Rx  Name  Route  Sig  Dispense  Refill  .  diphenhydramine-acetaminophen (TYLENOL PM) 25-500 MG TABS   Oral   Take 1 tablet by mouth at bedtime as needed (pain/sleep).          BP 142/126  Pulse 79  Temp(Src) 97.9 F (36.6 C) (Oral)  Resp 16  Ht 5\' 3"  (1.6 m)  Wt 195 lb (88.451 kg)  BMI 34.55 kg/m2  SpO2 95%  LMP 06/04/2013 Physical Exam  Constitutional: She is oriented to person, place, and time. She appears well-developed and well-nourished.  HENT:  Head: Normocephalic and atraumatic.  Eyes: EOM are normal. Pupils are equal, round, and reactive to light.  Neck: Neck supple.  No C spine tenderness  Cardiovascular: Normal rate, regular rhythm and intact distal pulses.   Pulmonary/Chest: Effort normal and breath sounds normal. No respiratory distress.  Abdominal: Soft. Bowel sounds are normal. She exhibits no distension. There is no tenderness.  Musculoskeletal: Normal range of motion. She exhibits no edema.  No erythema/ rash, no bony deformity or softy tissue tenderness  Neurological: She is alert and oriented to person, place, and time.  Skin: Skin is warm and dry.    ED Course   Procedures (including critical care time)  Results for orders placed during the hospital encounter of 06/10/13  RETICULOCYTES      Result  Value Range   Retic Ct Pct 11.6 (*) 0.4 - 3.1 %   RBC. 3.05 (*) 3.87 - 5.11 MIL/uL   Retic Count, Manual 353.8 (*) 19.0 - 186.0 K/uL  CBC WITH DIFFERENTIAL      Result Value Range   WBC 14.5 (*) 4.0 - 10.5 K/uL   RBC 3.05 (*) 3.87 - 5.11 MIL/uL   Hemoglobin 9.0 (*) 12.0 - 15.0 g/dL   HCT 16.1 (*) 09.6 - 04.5 %   MCV 82.6  78.0 - 100.0 fL   MCH 29.5  26.0 - 34.0 pg   MCHC 35.7  30.0 - 36.0 g/dL   RDW 40.9 (*) 81.1 - 91.4 %   Platelets 611 (*) 150 - 400 K/uL   Neutrophils Relative % 43  43 - 77 %   Lymphocytes Relative 44  12 - 46 %   Monocytes Relative 11  3 - 12 %   Eosinophils Relative 1  0 - 5 %   Basophils Relative 1  0 - 1 %   nRBC 1 (*) 0 /100 WBC   Neutro Abs 6.2  1.7 - 7.7 K/uL    Lymphs Abs 6.5 (*) 0.7 - 4.0 K/uL   Monocytes Absolute 1.6 (*) 0.1 - 1.0 K/uL   Eosinophils Absolute 0.1  0.0 - 0.7 K/uL   Basophils Absolute 0.1  0.0 - 0.1 K/uL   RBC Morphology TARGET CELLS     Smear Review PENDING PATHOLOGIST REVIEW    POCT I-STAT, CHEM 8      Result Value Range   Sodium 143  135 - 145 mEq/L   Potassium 3.5  3.5 - 5.1 mEq/L   Chloride 108  96 - 112 mEq/L   BUN 9  6 - 23 mg/dL   Creatinine, Ser 7.82  0.50 - 1.10 mg/dL   Glucose, Bld 93  70 - 99 mg/dL   Calcium, Ion 9.56  2.13 - 1.23 mmol/L   TCO2 22  0 - 100 mmol/L   Hemoglobin 10.2 (*) 12.0 - 15.0 g/dL   HCT 08.6 (*) 57.8 - 46.9 %   IVFs, oxygen, IV Dilaudid/ zofran, PO benadryl  Repeated IV Dilaudid  4:21 AM PT states she is feeling better, is requesting one more shot of dilaudid and then would like to be discharged home.    Plan f/u PCP. Return precautions verbalized as understood.   MDM  Sickle cell pain crisis  Labs, UA  IVfs, IV narcotics  Serial evaluations - condition improved  VS and nurses notes reviewed  Old records reviewed - prior ED visits for the same  Sunnie Nielsen, MD 06/10/13 0422

## 2013-06-10 NOTE — ED Notes (Signed)
Attempted to access IV for lab draws by 3 staff, failed. IV Team Nurse paged.

## 2013-06-10 NOTE — ED Notes (Signed)
Pt placed on 2L O2 for comfort.  

## 2013-06-10 NOTE — ED Notes (Signed)
Pt from home c/o sickle cell crisis. Pt was d/c from this facility this am. Pt states her pain has increased to 10/10 since d/c. Pt ambulatory, A&O

## 2013-06-10 NOTE — ED Notes (Signed)
[id number]<AV> Expected date:<BR> Expected time:<BR> Means of arrival:<BR> Comments:<BR> Sickle cell

## 2013-06-12 ENCOUNTER — Encounter (HOSPITAL_COMMUNITY): Payer: Self-pay | Admitting: *Deleted

## 2013-06-12 ENCOUNTER — Emergency Department (HOSPITAL_COMMUNITY)
Admission: EM | Admit: 2013-06-12 | Discharge: 2013-06-13 | Disposition: A | Discharge status: Home / Self Care | Payer: PRIVATE HEALTH INSURANCE | Attending: Emergency Medicine | Admitting: Emergency Medicine

## 2013-06-12 DIAGNOSIS — M79652 Pain in left thigh: Secondary | ICD-10-CM

## 2013-06-12 DIAGNOSIS — D5700 Hb-SS disease with crisis, unspecified: Secondary | ICD-10-CM | POA: Insufficient documentation

## 2013-06-12 DIAGNOSIS — M79609 Pain in unspecified limb: Secondary | ICD-10-CM | POA: Insufficient documentation

## 2013-06-12 DIAGNOSIS — Z8659 Personal history of other mental and behavioral disorders: Secondary | ICD-10-CM | POA: Insufficient documentation

## 2013-06-12 LAB — COMPREHENSIVE METABOLIC PANEL
ALT: 13 U/L (ref 0–35)
BUN: 6 mg/dL (ref 6–23)
Calcium: 9.7 mg/dL (ref 8.4–10.5)
GFR calc Af Amer: >90 mL/min (ref >90)
Glucose, Bld: 83 mg/dL (ref 70–99)
Sodium: 137 mEq/L (ref 135–145)
Total Protein: 8 g/dL (ref 6.0–8.3)

## 2013-06-12 LAB — RETICULOCYTES: Retic Ct Pct: 12.5 % — ABNORMAL HIGH (ref 0.4–3.1)

## 2013-06-12 MED ORDER — KETAMINE HCL 10 MG/ML IJ SOLN
9.0000 mg | Freq: Once | INTRAMUSCULAR | Status: AC
  Administered 2013-06-13: 9 mg via INTRAVENOUS

## 2013-06-12 MED ORDER — ONDANSETRON HCL 4 MG/2ML IJ SOLN
4.0000 mg | Freq: Once | INTRAMUSCULAR | Status: AC
  Administered 2013-06-12: 4 mg via INTRAVENOUS
  Filled 2013-06-12: qty 2

## 2013-06-12 MED ORDER — SODIUM CHLORIDE 0.9 % IV SOLN
Freq: Once | INTRAVENOUS | Status: AC
  Administered 2013-06-12: 23:00:00 via INTRAVENOUS

## 2013-06-12 MED ORDER — HYDROMORPHONE HCL PF 2 MG/ML IJ SOLN
2.0000 mg | Freq: Once | INTRAMUSCULAR | Status: AC
  Administered 2013-06-12: 2 mg via INTRAVENOUS
  Filled 2013-06-12: qty 1

## 2013-06-12 MED ORDER — DIPHENHYDRAMINE HCL 50 MG/ML IJ SOLN
12.5000 mg | Freq: Once | INTRAMUSCULAR | Status: AC
  Administered 2013-06-12: 12.5 mg via INTRAVENOUS
  Filled 2013-06-12: qty 1

## 2013-06-12 MED ORDER — KCL IN DEXTROSE-NACL 20-5-0.45 MEQ/L-%-% IV SOLN: Freq: Once | INTRAVENOUS | Status: DC

## 2013-06-12 NOTE — ED Notes (Signed)
Pt states that she began to have left leg pain approx 10 days ago; pt reports that this is her normal sickle cell pain; pt states that she was seen 2-3 days ago and continues to have pain to left leg

## 2013-06-13 LAB — CBC WITH DIFFERENTIAL/PLATELET
Basophils Relative: 0 % (ref 0–1)
Eosinophils Absolute: 0 10*3/uL (ref 0.0–0.7)
Hemoglobin: 9.1 g/dL — ABNORMAL LOW (ref 12.0–15.0)
Lymphocytes Relative: 26 % (ref 12–46)
MCHC: 35.1 g/dL (ref 30.0–36.0)
Neutrophils Relative %: 64 % (ref 43–77)
RBC: 3.17 MIL/uL — ABNORMAL LOW (ref 3.87–5.11)
WBC: 17.1 10*3/uL — ABNORMAL HIGH (ref 4.0–10.5)

## 2013-06-13 MED ORDER — HYDROMORPHONE HCL PF 1 MG/ML IJ SOLN
INTRAMUSCULAR | Status: AC
  Filled 2013-06-13: qty 1

## 2013-06-13 MED ORDER — POTASSIUM CHLORIDE CRYS ER 20 MEQ PO TBCR
40.0000 meq | EXTENDED_RELEASE_TABLET | Freq: Once | ORAL | Status: AC
Start: 2013-06-13 — End: 2013-06-13
  Administered 2013-06-13: 40 meq via ORAL
  Filled 2013-06-13: qty 2

## 2013-06-13 NOTE — ED Provider Notes (Signed)
CSN: [id number]     Arrival date & time 06/12/13  2214 History     First MD Initiated Contact with Patient 06/12/13 2254     Chief Complaint  Patient presents with  . Sickle Cell Pain Crisis   HPI [name] is a 22 y.o. female presenting with left thigh pain. She says this is typical of her sickle cell pain. She was in a couple days ago in the emergency department, had her pain well controlled, she went home and has been okay with by mouth pain medicine but has run out and her left leg hurts severely. She denies any chest pain, shortness of breath. Is a dull aching pain deep.  No fevers or chills, no sick contacts. No shortness of breath or productive cough.   Past Medical History  Diagnosis Date  . Sickle cell crisis   . Depression   . Headache(784.0)   . Abortion     05/2012   Past Surgical History  Procedure Laterality Date  . Splenectomy    . Splenectomy, total     Family History  Problem Relation Age of Onset  . Hypertension Mother   . Sickle cell anemia Sister   . Kidney disease Sister   . Arthritis Sister   . Sickle cell anemia Sister   . Sickle cell trait Sister   . Heart disease Maternal Aunt   . Heart disease Maternal Uncle    History  Substance Use Topics  . Smoking status: Never Smoker   . Smokeless tobacco: Never Used  . Alcohol Use: No   OB History   Grav Para Term Preterm Abortions TAB SAB Ect Mult Living                 Review of Systems At least 10pt or greater review of systems completed and are negative except where specified in the HPI.  Allergies  Other  Home Medications   Current Outpatient Rx  Name  Route  Sig  Dispense  Refill  . diphenhydramine-acetaminophen (TYLENOL PM) 25-500 MG TABS   Oral   Take 1-2 tablets by mouth at bedtime as needed (pain/sleep).          Marland Kitchen HYDROcodone-acetaminophen (NORCO/VICODIN) 5-325 MG per tablet   Oral   Take 1-2 tablets by mouth every 4 (four) hours as needed for pain.   10 tablet   0     BP 123/83  Pulse 111  Temp(Src) 99.4 F (37.4 C) (Oral)  Resp 20  Ht 5\' 4"  (1.626 m)  Wt 195 lb (88.451 kg)  BMI 33.46 kg/m2  SpO2 97%  LMP 06/04/2013 Physical Exam  Nursing notes reviewed.  Electronic medical record reviewed. VITAL SIGNS:   Filed Vitals:   06/12/13 2242  BP: 123/83  Pulse: 111  Temp: 99.4 F (37.4 C)  TempSrc: Oral  Resp: 20  Height: 5\' 4"  (1.626 m)  Weight: 195 lb (88.451 kg)  SpO2: 97%   CONSTITUTIONAL: Awake, oriented, appears non-toxic HENT: Atraumatic, normocephalic, oral mucosa pink and moist, airway patent. Nares patent without drainage. External ears normal. EYES: Conjunctiva clear, EOMI, PERRLA NECK: Trachea midline, non-tender, supple CARDIOVASCULAR: Normal heart rate, Normal rhythm, No murmurs, rubs, gallops PULMONARY/CHEST: Clear to auscultation, no rhonchi, wheezes, or rales. Symmetrical breath sounds. Non-tender. ABDOMINAL: Non-distended, soft, non-tender - no rebound or guarding.  BS normal. NEUROLOGIC: Non-focal, moving all four extremities, no gross sensory or motor deficits. EXTREMITIES: No clubbing, cyanosis, or edema. Some tenderness to palpation mid left thigh -  distally neurovascularly intact SKIN: Warm, Dry, No erythema, No rash  ED Course   Procedures (including critical care time)  Labs Reviewed  CBC WITH DIFFERENTIAL - Abnormal; Notable for the following:    WBC 17.1 (*)    RBC 3.17 (*)    Hemoglobin 9.1 (*)    HCT 25.9 (*)    RDW 16.7 (*)    Platelets 637 (*)    Neutro Abs 11.0 (*)    Lymphs Abs 4.4 (*)    Monocytes Absolute 1.7 (*)    All other components within normal limits  RETICULOCYTES - Abnormal; Notable for the following:    Retic Ct Pct 12.5 (*)    RBC. 3.17 (*)    Retic Count, Manual 396.3 (*)    All other components within normal limits  COMPREHENSIVE METABOLIC PANEL - Abnormal; Notable for the following:    Potassium 3.0 (*)    Total Bilirubin 2.3 (*)    All other components within normal limits    No results found. 1. Sickle cell pain crisis   2. Left thigh pain    Medications  HYDROmorphone (DILAUDID) injection 2 mg (2 mg Intravenous Given 06/12/13 2325)  ondansetron (ZOFRAN) injection 4 mg (4 mg Intravenous Given 06/12/13 2324)  diphenhydrAMINE (BENADRYL) injection 12.5 mg (12.5 mg Intravenous Given 06/12/13 2326)  ketamine (KETALAR) injection 9 mg (9 mg Intravenous Given 06/13/13 0010)  0.9 %  sodium chloride infusion ( Intravenous Stopped 06/13/13 0220)  potassium chloride SA (K-DUR,KLOR-CON) CR tablet 40 mEq (40 mEq Oral Given 06/13/13 0020)     MDM  She presents with left thigh pain consistent with prior sickle cell pain. Distally she is neurovascularly intact I doubt any occlusive crisis causing distal embolic disease. Left foot is warm and well perfused. She was treated aggressively with Dilaudid and ketamine for her pain, her pain level was well-controlled on discharge she was given a prescription to follow up with her primary care Dr. Ronne Binning.  No evidence for acute chest syndrome at this time, no chest symptoms, no coughing, no fever. Discharged home stable and in good condition, the patient understands accepts medical plan is as been dictated and agrees, eventual of her questions to her satisfaction.  Jones Skene, MD 06/13/13 [phone]

## 2013-06-13 NOTE — ED Notes (Signed)
See paper documentation during computer downtime. 

## 2013-09-03 ENCOUNTER — Encounter (HOSPITAL_COMMUNITY): Payer: Self-pay | Admitting: Emergency Medicine

## 2013-09-03 ENCOUNTER — Emergency Department (HOSPITAL_COMMUNITY)
Admission: EM | Admit: 2013-09-03 | Discharge: 2013-09-03 | Disposition: A | Discharge status: Home / Self Care | Payer: Medicaid Other | Attending: Emergency Medicine | Admitting: Emergency Medicine

## 2013-09-03 DIAGNOSIS — Z792 Long term (current) use of antibiotics: Secondary | ICD-10-CM | POA: Insufficient documentation

## 2013-09-03 DIAGNOSIS — H9209 Otalgia, unspecified ear: Secondary | ICD-10-CM | POA: Insufficient documentation

## 2013-09-03 DIAGNOSIS — Z79899 Other long term (current) drug therapy: Secondary | ICD-10-CM | POA: Insufficient documentation

## 2013-09-03 DIAGNOSIS — J069 Acute upper respiratory infection, unspecified: Secondary | ICD-10-CM | POA: Insufficient documentation

## 2013-09-03 DIAGNOSIS — J029 Acute pharyngitis, unspecified: Secondary | ICD-10-CM

## 2013-09-03 DIAGNOSIS — Z862 Personal history of diseases of the blood and blood-forming organs and certain disorders involving the immune mechanism: Secondary | ICD-10-CM | POA: Insufficient documentation

## 2013-09-03 DIAGNOSIS — Z8659 Personal history of other mental and behavioral disorders: Secondary | ICD-10-CM | POA: Insufficient documentation

## 2013-09-03 LAB — RAPID STREP SCREEN (MED CTR MEBANE ONLY): Streptococcus, Group A Screen (Direct): NEGATIVE

## 2013-09-03 MED ORDER — PSEUDOEPHEDRINE HCL ER 120 MG PO TB12: 120.0000 mg | ORAL_TABLET | Freq: Two times a day (BID) | ORAL | Status: DC

## 2013-09-03 MED ORDER — FLUTICASONE PROPIONATE 50 MCG/ACT NA SUSP: 2.0000 | Freq: Every day | NASAL | Status: DC

## 2013-09-03 NOTE — ED Provider Notes (Signed)
CSN: [id number]     Arrival date & time 09/03/13  1735 History  This chart was scribed for non-physician practitioner Junious Silk, PA-C, working with Toy Baker, MD by Dorothey Baseman, ED Scribe. This patient was seen in room WTR6/WTR6 and the patient's care was started at 6:26 PM.    Chief Complaint  Patient presents with  . Sore Throat   The history is provided by the patient. No language interpreter was used.   HPI Comments: Carmen [name] is a 22 y.o. female who presents to the Emergency Department complaining of a constant sore throat, described as a scratchiness, onset 2 weeks ago that has been progressively worsening. Patient reports associated difficulty swallowing secondary to pain, ear pain, and congestion. She reports taking "Nighttime," cough drops, and drinking hot water with lemon at home without relief. She denies cough, rhinorrhea, headache. 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   Past Medical History  Diagnosis Date  . Sickle cell crisis   . Depression   . Headache(784.0)   . Abortion     05/2012   Past Surgical History  Procedure Laterality Date  . Splenectomy    . Splenectomy, total     Family History  Problem Relation Age of Onset  . Hypertension Mother   . Sickle cell anemia Sister   . Kidney disease Sister   . Arthritis Sister   . Sickle cell anemia Sister   . Sickle cell trait Sister   . Heart disease Maternal Aunt   . Heart disease Maternal Uncle    History  Substance Use Topics  . Smoking status: Never Smoker   . Smokeless tobacco: Never Used  . Alcohol Use: No   OB History   Grav Para Term Preterm Abortions TAB SAB Ect Mult Living                 Review of Systems  HENT: Positive for congestion, ear pain, sore throat and trouble swallowing. Negative for rhinorrhea.   Respiratory: Negative for cough.   Neurological: Negative for headaches.  All other systems reviewed and are negative.    Allergies  Other  Home Medications    Current Outpatient Rx  Name  Route  Sig  Dispense  Refill  . amoxicillin (AMOXIL) 500 MG capsule   Oral   Take 500 mg by mouth 3 (three) times daily.         . Cholecalciferol (VITAMIN D PO)   Oral   Take 1 capsule by mouth daily.         Marland Kitchen HYDROcodone-acetaminophen (NORCO/VICODIN) 5-325 MG per tablet   Oral   Take 1-2 tablets by mouth every 4 (four) hours as needed for pain.   10 tablet   0    Triage Vitals: BP 126/76  Pulse 85  Temp(Src) 98.4 F (36.9 C) (Oral)  Resp 20  Ht 5\' 4"  (1.626 m)  Wt 170 lb (77.111 kg)  BMI 29.17 kg/m2  SpO2 99%  LMP 08/31/2013  Physical Exam  Nursing note and vitals reviewed. Constitutional: She is oriented to person, place, and time. She appears well-developed and well-nourished. No distress.  HENT:  Head: Normocephalic and atraumatic.  Right Ear: Tympanic membrane, external ear and ear canal normal.  Left Ear: Tympanic membrane, external ear and ear canal normal.  Mouth/Throat: Oropharynx is clear and moist. No oropharyngeal exudate.  Edematous nasal mucosa. Mildly enlarged tonsils bilaterally. No exudate. Uvula is midline.   Eyes: Conjunctivae are normal.  Neck:  Normal range of motion.  Cardiovascular: Normal rate, regular rhythm and normal heart sounds.   Pulmonary/Chest: Effort normal and breath sounds normal. No stridor. No respiratory distress. She has no wheezes. She has no rales.  Abdominal: Soft. She exhibits no distension.  Musculoskeletal: Normal range of motion.  Lymphadenopathy:    She has no cervical adenopathy.  Neurological: She is alert and oriented to person, place, and time. She has normal strength.  Skin: Skin is warm and dry. She is not diaphoretic. No erythema.  Psychiatric: She has a normal mood and affect. Her behavior is normal.    ED Course  Procedures (including critical care time)  DIAGNOSTIC STUDIES: Oxygen Saturation is 99% on room air, normal by my interpretation.    COORDINATION OF  CARE: 6:30 PM- Will order a rapid strep-test. Will discharge patient with Flonase. Advised patient to follow up with the referred ENT specialist. Discussed treatment plan with patient at bedside and patient verbalized agreement.   Labs Review Labs Reviewed  RAPID STREP SCREEN  CULTURE, GROUP A STREP   Imaging Review No results found.  EKG Interpretation   None       MDM   1. URI (upper respiratory infection)   2. Pharyngitis    Patients symptoms are consistent with URI, likely viral etiology. Discussed that antibiotics are not indicated for viral infections. Pt will be discharged with symptomatic treatment.  Verbalizes understanding and is agreeable with plan. Pt is hemodynamically stable & in NAD prior to dc.   I personally performed the services described in this documentation, which was scribed in my presence. The recorded information has been reviewed and is accurate.      Mora Bellman, PA-C 09/04/13 1623

## 2013-09-03 NOTE — ED Notes (Signed)
Pt presents with c/o sore throat that has been going on for two weeks. Pt has had difficulty swallowing.

## 2013-09-04 NOTE — ED Provider Notes (Signed)
Medical screening examination/treatment/procedure(s) were performed by non-physician practitioner and as supervising physician I was immediately available for consultation/collaboration.  Maelle Sheaffer T Dearies Meikle, MD 09/04/13 2322 

## 2013-09-06 ENCOUNTER — Other Ambulatory Visit: Payer: Self-pay

## 2013-09-06 LAB — CULTURE, GROUP A STREP

## 2013-11-01 DIAGNOSIS — A539 Syphilis, unspecified: Secondary | ICD-10-CM

## 2013-11-01 HISTORY — DX: Syphilis, unspecified: A53.9

## 2013-11-02 ENCOUNTER — Emergency Department (HOSPITAL_COMMUNITY): Payer: PRIVATE HEALTH INSURANCE

## 2013-11-02 ENCOUNTER — Encounter (HOSPITAL_COMMUNITY): Payer: Self-pay | Admitting: Emergency Medicine

## 2013-11-02 ENCOUNTER — Emergency Department (HOSPITAL_COMMUNITY)
Admission: EM | Admit: 2013-11-02 | Discharge: 2013-11-02 | Discharge status: Against Medical Advice | Payer: PRIVATE HEALTH INSURANCE | Attending: Emergency Medicine | Admitting: Emergency Medicine

## 2013-11-02 DIAGNOSIS — D5700 Hb-SS disease with crisis, unspecified: Secondary | ICD-10-CM | POA: Insufficient documentation

## 2013-11-02 DIAGNOSIS — J029 Acute pharyngitis, unspecified: Secondary | ICD-10-CM | POA: Insufficient documentation

## 2013-11-02 LAB — BASIC METABOLIC PANEL
BUN: 7 mg/dL (ref 6–23)
CO2: 22 mEq/L (ref 19–32)
Calcium: 9.6 mg/dL (ref 8.4–10.5)
Chloride: 101 mEq/L (ref 96–112)
Creatinine, Ser: 0.54 mg/dL (ref 0.50–1.10)
Glucose, Bld: 88 mg/dL (ref 70–99)
POTASSIUM: 3.6 meq/L — AB (ref 3.7–5.3)
Sodium: 137 mEq/L (ref 137–147)

## 2013-11-02 LAB — POCT I-STAT TROPONIN I: TROPONIN I, POC: 0.01 ng/mL (ref 0.00–0.08)

## 2013-11-02 LAB — RETICULOCYTES
RBC.: 3.14 MIL/uL — AB (ref 3.87–5.11)
RETIC COUNT ABSOLUTE: 323.4 10*3/uL — AB (ref 19.0–186.0)
RETIC CT PCT: 10.3 % — AB (ref 0.4–3.1)

## 2013-11-02 NOTE — ED Notes (Signed)
Pt c/o pain thru chest to back x 10 days; states chest pain is not normal sickle cell pain; no shortness of breath; no cough; c/o sore throat

## 2013-11-03 LAB — CBC WITH DIFFERENTIAL/PLATELET
BASOS ABS: 0 10*3/uL (ref 0.0–0.1)
BASOS PCT: 0 % (ref 0–1)
EOS PCT: 1 % (ref 0–5)
Eosinophils Absolute: 0.2 10*3/uL (ref 0.0–0.7)
HEMATOCRIT: 26.1 % — AB (ref 36.0–46.0)
HEMOGLOBIN: 9.1 g/dL — AB (ref 12.0–15.0)
LYMPHS ABS: 3.3 10*3/uL (ref 0.7–4.0)
LYMPHS PCT: 18 % (ref 12–46)
MCH: 29 pg (ref 26.0–34.0)
MCHC: 34.9 g/dL (ref 30.0–36.0)
MCV: 83.1 fL (ref 78.0–100.0)
MONOS PCT: 9 % (ref 3–12)
Monocytes Absolute: 1.6 10*3/uL — ABNORMAL HIGH (ref 0.1–1.0)
NEUTROS ABS: 13.2 10*3/uL — AB (ref 1.7–7.7)
Neutrophils Relative %: 72 % (ref 43–77)
Platelets: 638 10*3/uL — ABNORMAL HIGH (ref 150–400)
RBC: 3.14 MIL/uL — AB (ref 3.87–5.11)
RDW: 16.2 % — AB (ref 11.5–15.5)
WBC: 18.3 10*3/uL — AB (ref 4.0–10.5)

## 2013-11-10 ENCOUNTER — Emergency Department (HOSPITAL_COMMUNITY)
Admission: EM | Admit: 2013-11-10 | Discharge: 2013-11-11 | Disposition: A | Discharge status: Home / Self Care | Payer: PRIVATE HEALTH INSURANCE | Attending: Emergency Medicine | Admitting: Emergency Medicine

## 2013-11-10 ENCOUNTER — Encounter (HOSPITAL_COMMUNITY): Payer: Self-pay | Admitting: Emergency Medicine

## 2013-11-10 ENCOUNTER — Emergency Department (HOSPITAL_COMMUNITY): Payer: PRIVATE HEALTH INSURANCE

## 2013-11-10 DIAGNOSIS — Z8659 Personal history of other mental and behavioral disorders: Secondary | ICD-10-CM | POA: Insufficient documentation

## 2013-11-10 DIAGNOSIS — J029 Acute pharyngitis, unspecified: Secondary | ICD-10-CM | POA: Diagnosis not present

## 2013-11-10 DIAGNOSIS — Z8742 Personal history of other diseases of the female genital tract: Secondary | ICD-10-CM | POA: Diagnosis not present

## 2013-11-10 DIAGNOSIS — R0602 Shortness of breath: Secondary | ICD-10-CM | POA: Diagnosis not present

## 2013-11-10 DIAGNOSIS — Z8709 Personal history of other diseases of the respiratory system: Secondary | ICD-10-CM | POA: Diagnosis not present

## 2013-11-10 DIAGNOSIS — D5700 Hb-SS disease with crisis, unspecified: Secondary | ICD-10-CM

## 2013-11-10 LAB — CBC WITH DIFFERENTIAL/PLATELET
Basophils Absolute: 0.2 10*3/uL — ABNORMAL HIGH (ref 0.0–0.1)
Basophils Relative: 1 % (ref 0–1)
EOS ABS: 0.2 10*3/uL (ref 0.0–0.7)
EOS PCT: 1 % (ref 0–5)
HCT: 26.3 % — ABNORMAL LOW (ref 36.0–46.0)
Hemoglobin: 8.9 g/dL — ABNORMAL LOW (ref 12.0–15.0)
Lymphocytes Relative: 45 % (ref 12–46)
Lymphs Abs: 7.8 10*3/uL — ABNORMAL HIGH (ref 0.7–4.0)
MCH: 27.9 pg (ref 26.0–34.0)
MCHC: 33.8 g/dL (ref 30.0–36.0)
MCV: 82.4 fL (ref 78.0–100.0)
Monocytes Absolute: 1.7 10*3/uL — ABNORMAL HIGH (ref 0.1–1.0)
Monocytes Relative: 10 % (ref 3–12)
Neutro Abs: 7.5 10*3/uL (ref 1.7–7.7)
Neutrophils Relative %: 43 % (ref 43–77)
Platelets: 808 10*3/uL — ABNORMAL HIGH (ref 150–400)
RBC: 3.19 MIL/uL — AB (ref 3.87–5.11)
RDW: 18.2 % — ABNORMAL HIGH (ref 11.5–15.5)
WBC: 17.4 10*3/uL — ABNORMAL HIGH (ref 4.0–10.5)

## 2013-11-10 LAB — RAPID STREP SCREEN (MED CTR MEBANE ONLY): Streptococcus, Group A Screen (Direct): NEGATIVE

## 2013-11-10 LAB — COMPREHENSIVE METABOLIC PANEL
ALT: 22 U/L (ref 0–35)
AST: 23 U/L (ref 0–37)
Albumin: 3.7 g/dL (ref 3.5–5.2)
Alkaline Phosphatase: 89 U/L (ref 39–117)
BUN: 9 mg/dL (ref 6–23)
CALCIUM: 9 mg/dL (ref 8.4–10.5)
CO2: 25 mEq/L (ref 19–32)
Chloride: 101 mEq/L (ref 96–112)
Creatinine, Ser: 0.59 mg/dL (ref 0.50–1.10)
GFR calc non Af Amer: >90 mL/min (ref >90)
GLUCOSE: 82 mg/dL (ref 70–99)
Potassium: 4.2 mEq/L (ref 3.7–5.3)
SODIUM: 137 meq/L (ref 137–147)
TOTAL PROTEIN: 8.3 g/dL (ref 6.0–8.3)
Total Bilirubin: 0.7 mg/dL (ref 0.3–1.2)

## 2013-11-10 LAB — RETICULOCYTES
RBC.: 3.19 MIL/uL — ABNORMAL LOW (ref 3.87–5.11)
RETIC COUNT ABSOLUTE: 322.2 10*3/uL — AB (ref 19.0–186.0)
Retic Ct Pct: 10.1 % — ABNORMAL HIGH (ref 0.4–3.1)

## 2013-11-10 MED ORDER — ONDANSETRON HCL 4 MG/2ML IJ SOLN
4.0000 mg | Freq: Once | INTRAMUSCULAR | Status: AC
  Administered 2013-11-10: 4 mg via INTRAVENOUS
  Filled 2013-11-10: qty 2

## 2013-11-10 MED ORDER — HYDROMORPHONE HCL PF 1 MG/ML IJ SOLN
1.0000 mg | Freq: Once | INTRAMUSCULAR | Status: AC
  Administered 2013-11-11: 1 mg via INTRAVENOUS
  Filled 2013-11-10: qty 1

## 2013-11-10 MED ORDER — SODIUM CHLORIDE 0.9 % IV BOLUS (SEPSIS)
500.0000 mL | Freq: Once | INTRAVENOUS | Status: AC
Start: 2013-11-10 — End: 2013-11-11
  Administered 2013-11-10: 500 mL via INTRAVENOUS

## 2013-11-10 MED ORDER — HYDROMORPHONE HCL PF 1 MG/ML IJ SOLN
1.0000 mg | Freq: Once | INTRAMUSCULAR | Status: AC
  Administered 2013-11-10: 1 mg via INTRAVENOUS
  Filled 2013-11-10: qty 1

## 2013-11-10 NOTE — ED Notes (Signed)
Pt c/o R shoulder pain onset this am. Pt states pain similar to typical SS pain. Pt out of pain meds

## 2013-11-10 NOTE — ED Notes (Signed)
Patient transported to XR. 

## 2013-11-10 NOTE — ED Notes (Signed)
Patient states her R shoulder/arm pain has decreased to 6/10 "is almost gone", but that it has shifted to her L leg, rated 10/10. PA notified.

## 2013-11-10 NOTE — ED Provider Notes (Signed)
Medical screening examination/treatment/procedure(s) were conducted as a shared visit with non-physician practitioner(s) and myself.  I personally evaluated the patient during the encounter.  EKG Interpretation   None      Pt here with usual sickle cell pain, no concern for acute chest or cva, pain meds given and pt feels stable to go home  Leota Jacobsen, MD 11/10/13 2328

## 2013-11-10 NOTE — ED Provider Notes (Signed)
CSN: [id number]     Arrival date & time 11/10/13  1913 History   First MD Initiated Contact with Patient 11/10/13 1927     Chief Complaint  Patient presents with  . Sickle Cell Pain Crisis   (Consider location/radiation/quality/duration/timing/severity/associated sxs/prior Treatment) HPI Comments: Patient with history of sickle cell anemia presents with complaint of right shoulder pain which began this morning. Pain is typical for her sickle cell pain crisis. She denies pain elsewhere at this time. Patient states that shortly after Christmas, approximately 2 weeks ago, she developed upper respiratory tract infection symptoms with cough. She came to ED and had work-up initiated but left due to the wait times. Since then, symptoms have gradually improved but she still has residual sore throat. Patient takes Vicodin as needed for pain, but states that she has been out for one month. She otherwise denies fever, chest pain. She does have shortness of breath at times. No vomiting or diarrhea. No LE swelling or other risk factors for PE. The onset of this condition was acute. The course is constant. Aggravating factors: none. Alleviating factors: none.    The history is provided by the patient.    Past Medical History  Diagnosis Date  . Sickle cell crisis   . Depression   . Headache(784.0)   . Abortion     05/2012   Past Surgical History  Procedure Laterality Date  . Splenectomy    . Splenectomy, total     Family History  Problem Relation Age of Onset  . Hypertension Mother   . Sickle cell anemia Sister   . Kidney disease Sister   . Arthritis Sister   . Sickle cell anemia Sister   . Sickle cell trait Sister   . Heart disease Maternal Aunt   . Heart disease Maternal Uncle    History  Substance Use Topics  . Smoking status: Never Smoker   . Smokeless tobacco: Never Used  . Alcohol Use: No   OB History   Grav Para Term Preterm Abortions TAB SAB Ect Mult Living                  Review of Systems  Constitutional: Negative for fever.  HENT: Positive for sore throat. Negative for rhinorrhea.   Eyes: Negative for redness.  Respiratory: Negative for cough.   Cardiovascular: Negative for chest pain.  Gastrointestinal: Negative for nausea, vomiting, abdominal pain and diarrhea.  Genitourinary: Negative for dysuria.  Musculoskeletal: Positive for arthralgias and myalgias.  Skin: Negative for rash.  Neurological: Negative for headaches.    Allergies  Other  Home Medications   Current Outpatient Rx  Name  Route  Sig  Dispense  Refill  . HYDROcodone-acetaminophen (NORCO/VICODIN) 5-325 MG per tablet      Take 1-2 tablets every 6 hours as needed for severe pain   15 tablet   0    BP 118/69  Pulse 103  Temp(Src) 97.8 F (36.6 C) (Oral)  Resp 20  Wt 170 lb (77.111 kg)  SpO2 98%  LMP 11/02/2013 Physical Exam  Nursing note and vitals reviewed. Constitutional: She appears well-developed and well-nourished.  HENT:  Head: Normocephalic and atraumatic.  Right Ear: Tympanic membrane, external ear and ear canal normal.  Left Ear: Tympanic membrane, external ear and ear canal normal.  Nose: Nose normal. No mucosal edema, rhinorrhea or nose lacerations.  Mouth/Throat: Uvula is midline and mucous membranes are normal. Posterior oropharyngeal erythema present. No oropharyngeal exudate or posterior oropharyngeal edema.  Eyes: Conjunctivae  are normal. Right eye exhibits no discharge. Left eye exhibits no discharge.  Neck: Normal range of motion. Neck supple.  Cardiovascular: Normal rate, regular rhythm and normal heart sounds.   Pulmonary/Chest: Effort normal and breath sounds normal.  Abdominal: Soft. There is no tenderness.  Musculoskeletal: She exhibits tenderness. She exhibits no edema.       Right shoulder: She exhibits tenderness and bony tenderness. She exhibits normal range of motion and no swelling.       Right elbow: Normal.      Cervical back: Normal.   Neurological: She is alert.  Skin: Skin is warm and dry.  Psychiatric: She has a normal mood and affect.    ED Course  Procedures (including critical care time) Labs Review Labs Reviewed  CBC WITH DIFFERENTIAL - Abnormal; Notable for the following:    WBC 17.4 (*)    RBC 3.19 (*)    Hemoglobin 8.9 (*)    HCT 26.3 (*)    RDW 18.2 (*)    Platelets 808 (*)    Lymphs Abs 7.8 (*)    Monocytes Absolute 1.7 (*)    Basophils Absolute 0.2 (*)    All other components within normal limits  RETICULOCYTES - Abnormal; Notable for the following:    Retic Ct Pct 10.1 (*)    RBC. 3.19 (*)    Retic Count, Manual 322.2 (*)    All other components within normal limits  RAPID STREP SCREEN  CULTURE, GROUP A STREP  COMPREHENSIVE METABOLIC PANEL  CBC WITH DIFFERENTIAL  PATHOLOGIST SMEAR REVIEW   Imaging Review Dg Chest 2 View  11/10/2013   CLINICAL DATA:  Cough, right shoulder pain  EXAM: CHEST  2 VIEW  COMPARISON:  11/02/2013  FINDINGS: Lungs are clear.  No pleural effusion or pneumothorax.  The heart is normal in size.  Visualized osseous structures are within normal limits.  IMPRESSION: Normal chest radiographs.   Electronically Signed   By: Julian Hy M.D.   On: 11/10/2013 20:38    EKG Interpretation   None      8:19 PM Patient seen and examined. Work-up initiated. Medications ordered.   Vital signs reviewed and are as follows: Filed Vitals:   11/10/13 1923  BP: 118/69  Pulse: 103  Temp: 97.8 F (36.6 C)  Resp: 20   12:54 AM Patient was discussed with and seen by Dr. Zenia Resides previously.   She is feeling better after 3 IV doses of dilaudid. Will give vicodin prior to discharge. Patient states that she wants to go home.  Patient instructed to return with worsening severe pain uncontrolled with home pain medications, fever, shortness of breath, or atypical pain. She verbalizes understanding and agrees with plan.  Patient counseled on use of narcotic pain medications.  Counseled not to combine these medications with others containing tylenol. Urged not to drink alcohol, drive, or perform any other activities that requires focus while taking these medications. The patient verbalizes understanding and agrees with the plan.    MDM   1. Vasoocclusive sickle cell crisis    Patient with vaso-occlusive sickle cell crisis. Pain controlled in emergency department. Her anemia is at baseline. She does not have any chest pain, shortness of breath to suggest chest acute chest syndrome and her chest x-ray is negative. Strep test is negative. She is afebrile currently. Will discharge home with supportive treatment, appropriate return instructions given.    Carlisle Cater, PA-C 11/11/13 [phone]

## 2013-11-10 NOTE — ED Notes (Addendum)
Patient c/o R shoulder pain that is radiating to her elbow. Patient states this is similar to her sickle cell pain. Patient states this started this morning. Patient also c/o sore throat.

## 2013-11-11 MED ORDER — HYDROCODONE-ACETAMINOPHEN 5-325 MG PO TABS: ORAL_TABLET | ORAL | Status: DC

## 2013-11-11 MED ORDER — HYDROCODONE-ACETAMINOPHEN 5-325 MG PO TABS
2.0000 | ORAL_TABLET | Freq: Once | ORAL | Status: AC
  Administered 2013-11-11: 2 via ORAL
  Filled 2013-11-11: qty 2

## 2013-11-11 NOTE — Discharge Instructions (Signed)
Please read and follow all provided instructions.  Your diagnoses today include:  1. Vasoocclusive sickle cell crisis     Tests performed today include:  Blood counts and electrolytes - stable anemia  Strep test - no strep  Chest x-ray - was normal, no pneumonia or other infection  Vital signs. See below for your results today.   Medications prescribed:   Vicodin (hydrocodone/acetaminophen) - narcotic pain medication  DO NOT drive or perform any activities that require you to be awake and alert because this medicine can make you drowsy. BE VERY CAREFUL not to take multiple medicines containing Tylenol (also called acetaminophen). Doing so can lead to an overdose which can damage your liver and cause liver failure and possibly death.  Take any prescribed medications only as directed.  Home care instructions:  Follow any educational materials contained in this packet.  BE VERY CAREFUL not to take multiple medicines containing Tylenol (also called acetaminophen). Doing so can lead to an overdose which can damage your liver and cause liver failure and possibly death.   Follow-up instructions: Please follow-up with your primary care provider in the next 3 days for further evaluation of your symptoms. If you do not have a primary care doctor -- see below for referral information.   Return instructions:   Please return to the Emergency Department if you experience worsening symptoms.   Return with severe pain, fever, vomiting  Please return if you have any other emergent concerns.  Additional Information:  Your vital signs today were: BP 118/69   Pulse 103   Temp(Src) 97.8 F (36.6 C) (Oral)   Resp 20   Wt 170 lb (77.111 kg)   SpO2 98%   LMP 11/02/2013 If your blood pressure (BP) was elevated above 135/85 this visit, please have this repeated by your doctor within one month. --------------

## 2013-11-11 NOTE — ED Notes (Signed)
Patient c/o nausea. Patient given gingerale and saltines.

## 2013-11-12 ENCOUNTER — Encounter (HOSPITAL_COMMUNITY): Payer: Self-pay | Admitting: Emergency Medicine

## 2013-11-12 ENCOUNTER — Inpatient Hospital Stay (HOSPITAL_COMMUNITY)
Admission: EM | Admit: 2013-11-12 | Discharge: 2013-11-13 | DRG: 812 | Disposition: A | Discharge status: Home / Self Care | Payer: PRIVATE HEALTH INSURANCE | Attending: Internal Medicine | Admitting: Internal Medicine

## 2013-11-12 DIAGNOSIS — Z79899 Other long term (current) drug therapy: Secondary | ICD-10-CM

## 2013-11-12 DIAGNOSIS — D5700 Hb-SS disease with crisis, unspecified: Principal | ICD-10-CM | POA: Diagnosis present

## 2013-11-12 DIAGNOSIS — Z8249 Family history of ischemic heart disease and other diseases of the circulatory system: Secondary | ICD-10-CM

## 2013-11-12 DIAGNOSIS — F329 Major depressive disorder, single episode, unspecified: Secondary | ICD-10-CM | POA: Diagnosis present

## 2013-11-12 DIAGNOSIS — D72829 Elevated white blood cell count, unspecified: Secondary | ICD-10-CM | POA: Diagnosis present

## 2013-11-12 DIAGNOSIS — F3289 Other specified depressive episodes: Secondary | ICD-10-CM | POA: Diagnosis present

## 2013-11-12 DIAGNOSIS — Z23 Encounter for immunization: Secondary | ICD-10-CM

## 2013-11-12 LAB — CBC WITH DIFFERENTIAL/PLATELET
BASOS PCT: 0 % (ref 0–1)
Basophils Absolute: 0 10*3/uL (ref 0.0–0.1)
EOS PCT: 0 % (ref 0–5)
Eosinophils Absolute: 0 10*3/uL (ref 0.0–0.7)
HCT: 26.1 % — ABNORMAL LOW (ref 36.0–46.0)
HEMOGLOBIN: 9 g/dL — AB (ref 12.0–15.0)
LYMPHS PCT: 9 % — AB (ref 12–46)
Lymphs Abs: 2.2 10*3/uL (ref 0.7–4.0)
MCH: 28.1 pg (ref 26.0–34.0)
MCHC: 34.5 g/dL (ref 30.0–36.0)
MCV: 81.6 fL (ref 78.0–100.0)
Monocytes Absolute: 2 10*3/uL — ABNORMAL HIGH (ref 0.1–1.0)
Monocytes Relative: 8 % (ref 3–12)
NEUTROS PCT: 83 % — AB (ref 43–77)
Neutro Abs: 20.5 10*3/uL — ABNORMAL HIGH (ref 1.7–7.7)
Platelets: 610 10*3/uL — ABNORMAL HIGH (ref 150–400)
RBC: 3.2 MIL/uL — ABNORMAL LOW (ref 3.87–5.11)
RDW: 17.9 % — ABNORMAL HIGH (ref 11.5–15.5)
WBC: 24.7 10*3/uL — AB (ref 4.0–10.5)
nRBC: 23 /100 WBC — ABNORMAL HIGH

## 2013-11-12 LAB — COMPREHENSIVE METABOLIC PANEL
ALT: 22 U/L (ref 0–35)
AST: 33 U/L (ref 0–37)
Albumin: 3.9 g/dL (ref 3.5–5.2)
Alkaline Phosphatase: 130 U/L — ABNORMAL HIGH (ref 39–117)
BUN: 6 mg/dL (ref 6–23)
CALCIUM: 9.3 mg/dL (ref 8.4–10.5)
CO2: 23 meq/L (ref 19–32)
Chloride: 97 mEq/L (ref 96–112)
Creatinine, Ser: 0.52 mg/dL (ref 0.50–1.10)
GFR calc non Af Amer: >90 mL/min (ref >90)
GLUCOSE: 111 mg/dL — AB (ref 70–99)
Potassium: 3.9 mEq/L (ref 3.7–5.3)
Sodium: 135 mEq/L — ABNORMAL LOW (ref 137–147)
Total Bilirubin: 1.7 mg/dL — ABNORMAL HIGH (ref 0.3–1.2)
Total Protein: 8.8 g/dL — ABNORMAL HIGH (ref 6.0–8.3)

## 2013-11-12 LAB — RETICULOCYTES
RBC.: 3.2 MIL/uL — AB (ref 3.87–5.11)
Retic Count, Absolute: 262.4 10*3/uL — ABNORMAL HIGH (ref 19.0–186.0)
Retic Ct Pct: 8.2 % — ABNORMAL HIGH (ref 0.4–3.1)

## 2013-11-12 MED ORDER — ONDANSETRON HCL 4 MG/2ML IJ SOLN: 4.0000 mg | INTRAMUSCULAR | Status: DC | PRN

## 2013-11-12 MED ORDER — HYDROMORPHONE HCL PF 1 MG/ML IJ SOLN
1.0000 mg | Freq: Once | INTRAMUSCULAR | Status: AC
  Administered 2013-11-12: 1 mg via INTRAVENOUS
  Filled 2013-11-12: qty 1

## 2013-11-12 MED ORDER — HYDROMORPHONE HCL PF 2 MG/ML IJ SOLN
2.0000 mg | Freq: Once | INTRAMUSCULAR | Status: AC
  Administered 2013-11-12: 2 mg via INTRAVENOUS
  Filled 2013-11-12: qty 1

## 2013-11-12 MED ORDER — HYDROXYUREA 500 MG PO CAPS
500.0000 mg | ORAL_CAPSULE | Freq: Two times a day (BID) | ORAL | Status: DC
  Administered 2013-11-12 – 2013-11-13 (×2): 500 mg via ORAL
  Filled 2013-11-12 (×4): qty 1

## 2013-11-12 MED ORDER — SODIUM CHLORIDE 0.9 % IV BOLUS (SEPSIS)
1000.0000 mL | Freq: Once | INTRAVENOUS | Status: AC
Start: 2013-11-12 — End: 2013-11-12
  Administered 2013-11-12: 1000 mL via INTRAVENOUS

## 2013-11-12 MED ORDER — PNEUMOCOCCAL VAC POLYVALENT 25 MCG/0.5ML IJ INJ
0.5000 mL | INJECTION | INTRAMUSCULAR | Status: AC
  Administered 2013-11-13: 0.5 mL via INTRAMUSCULAR
  Filled 2013-11-12 (×2): qty 0.5

## 2013-11-12 MED ORDER — ONDANSETRON HCL 4 MG PO TABS: 4.0000 mg | ORAL_TABLET | ORAL | Status: DC | PRN

## 2013-11-12 MED ORDER — SODIUM CHLORIDE 0.9 % IJ SOLN
3.0000 mL | Freq: Two times a day (BID) | INTRAMUSCULAR | Status: DC
  Administered 2013-11-12 – 2013-11-13 (×2): 3 mL via INTRAVENOUS

## 2013-11-12 MED ORDER — SODIUM CHLORIDE 0.9 % IV SOLN: 250.0000 mL | INTRAVENOUS | Status: DC | PRN

## 2013-11-12 MED ORDER — SODIUM CHLORIDE 0.9 % IJ SOLN: 3.0000 mL | INTRAMUSCULAR | Status: DC | PRN

## 2013-11-12 MED ORDER — HYDROMORPHONE HCL PF 1 MG/ML IJ SOLN
1.0000 mg | INTRAMUSCULAR | Status: DC
  Administered 2013-11-12 – 2013-11-13 (×4): 1 mg via INTRAVENOUS
  Filled 2013-11-12 (×4): qty 1

## 2013-11-12 MED ORDER — ONDANSETRON HCL 4 MG/2ML IJ SOLN
4.0000 mg | Freq: Once | INTRAMUSCULAR | Status: AC
  Administered 2013-11-12: 4 mg via INTRAVENOUS
  Filled 2013-11-12: qty 2

## 2013-11-12 MED ORDER — HYDROMORPHONE HCL PF 1 MG/ML IJ SOLN
1.0000 mg | INTRAMUSCULAR | Status: AC | PRN
  Administered 2013-11-12: 1 mg via INTRAVENOUS
  Filled 2013-11-12: qty 1

## 2013-11-12 MED ORDER — ENOXAPARIN SODIUM 40 MG/0.4ML ~~LOC~~ SOLN
40.0000 mg | Freq: Every day | SUBCUTANEOUS | Status: DC
  Administered 2013-11-12: 40 mg via SUBCUTANEOUS
  Filled 2013-11-12 (×2): qty 0.4

## 2013-11-12 MED ORDER — INFLUENZA VAC SPLIT QUAD 0.5 ML IM SUSP
0.5000 mL | INTRAMUSCULAR | Status: AC
  Administered 2013-11-13: 0.5 mL via INTRAMUSCULAR
  Filled 2013-11-12 (×2): qty 0.5

## 2013-11-12 MED ORDER — ONDANSETRON HCL 4 MG/2ML IJ SOLN: 4.0000 mg | Freq: Three times a day (TID) | INTRAMUSCULAR | Status: DC | PRN

## 2013-11-12 MED ORDER — FOLIC ACID 1 MG PO TABS
1.0000 mg | ORAL_TABLET | Freq: Every day | ORAL | Status: DC
  Administered 2013-11-12 – 2013-11-13 (×2): 1 mg via ORAL
  Filled 2013-11-12 (×2): qty 1

## 2013-11-12 MED ORDER — KETOROLAC TROMETHAMINE 15 MG/ML IJ SOLN
15.0000 mg | Freq: Four times a day (QID) | INTRAMUSCULAR | Status: DC
  Administered 2013-11-12 – 2013-11-13 (×3): 15 mg via INTRAVENOUS
  Filled 2013-11-12 (×7): qty 1

## 2013-11-12 NOTE — ED Provider Notes (Signed)
CSN: [id number]     Arrival date & time 11/12/13  1325 History   First MD Initiated Contact with Patient 11/12/13 1459     Chief Complaint  Patient presents with  . Sickle Cell Pain Crisis   (Consider location/radiation/quality/duration/timing/severity/associated sxs/prior Treatment) The history is provided by the patient and medical records.   This is a 23 y.o. F with PMH significant for SCA, depression, presenting to the ED for sickle cell pain crisis.  Pt seen in the ED on 11/10/12 for the same, states she felt fine afterwards but  upon waking this morning she was in severe pain, mostly in her arms and legs.  Denies any chest pain, SOB, or palpitations.  Endorses some nausea but no vomiting.  No abdominal pain.  No fevers, sweats, or chills.  Pt usually takes vicodin for pain but has not had any relief from this.   PCP- McKenzie  Past Medical History  Diagnosis Date  . Sickle cell crisis   . Depression   . Headache(784.0)   . Abortion     05/2012   Past Surgical History  Procedure Laterality Date  . Splenectomy    . Splenectomy, total     Family History  Problem Relation Age of Onset  . Hypertension Mother   . Sickle cell anemia Sister   . Kidney disease Sister   . Arthritis Sister   . Sickle cell anemia Sister   . Sickle cell trait Sister   . Heart disease Maternal Aunt   . Heart disease Maternal Uncle    History  Substance Use Topics  . Smoking status: Never Smoker   . Smokeless tobacco: Never Used  . Alcohol Use: No   OB History   Grav Para Term Preterm Abortions TAB SAB Ect Mult Living                 Review of Systems  Musculoskeletal: Positive for arthralgias and myalgias.  All other systems reviewed and are negative.    Allergies  Other  Home Medications   Current Outpatient Rx  Name  Route  Sig  Dispense  Refill  . HYDROcodone-acetaminophen (NORCO/VICODIN) 5-325 MG per tablet      Take 1-2 tablets every 6 hours as needed for severe pain   15  tablet   0    BP 129/85  Pulse 98  Temp(Src) 98.2 F (36.8 C) (Oral)  Resp 20  SpO2 98%  LMP 11/02/2013  Physical Exam  Nursing note and vitals reviewed. Constitutional: She is oriented to person, place, and time. She appears well-developed and well-nourished. No distress.  HENT:  Head: Normocephalic and atraumatic.  Mouth/Throat: Oropharynx is clear and moist.  Eyes: Conjunctivae and EOM are normal. Pupils are equal, round, and reactive to light.  Neck: Normal range of motion. Neck supple.  Cardiovascular: Normal rate, regular rhythm and normal heart sounds.   Pulmonary/Chest: Effort normal and breath sounds normal. No respiratory distress. She has no wheezes.  Abdominal: Soft. Bowel sounds are normal. There is no tenderness. There is no guarding.  Musculoskeletal: Normal range of motion.  Pain of upper and lower extremities bilaterally without noted deformities or signs of injury  Neurological: She is alert and oriented to person, place, and time.  Skin: Skin is warm and dry. She is not diaphoretic.  Psychiatric: She has a normal mood and affect.    ED Course  Procedures (including critical care time) Labs Review Labs Reviewed  COMPREHENSIVE METABOLIC PANEL - Abnormal; Notable for  the following:    Sodium 135 (*)    Glucose, Bld 111 (*)    Total Protein 8.8 (*)    Alkaline Phosphatase 130 (*)    Total Bilirubin 1.7 (*)    All other components within normal limits  CBC WITH DIFFERENTIAL - Abnormal; Notable for the following:    WBC 24.7 (*)    RBC 3.20 (*)    Hemoglobin 9.0 (*)    HCT 26.1 (*)    RDW 17.9 (*)    Platelets 610 (*)    Neutrophils Relative % 83 (*)    Lymphocytes Relative 9 (*)    nRBC 23 (*)    Neutro Abs 20.5 (*)    Monocytes Absolute 2.0 (*)    All other components within normal limits  RETICULOCYTES - Abnormal; Notable for the following:    Retic Ct Pct 8.2 (*)    RBC. 3.20 (*)    Retic Count, Manual 262.4 (*)    All other components within  normal limits  CBC WITH DIFFERENTIAL  RETICULOCYTES   Imaging Review Dg Chest 2 View  11/10/2013   CLINICAL DATA:  Cough, right shoulder pain  EXAM: CHEST  2 VIEW  COMPARISON:  11/02/2013  FINDINGS: Lungs are clear.  No pleural effusion or pneumothorax.  The heart is normal in size.  Visualized osseous structures are within normal limits.  IMPRESSION: Normal chest radiographs.   Electronically Signed   By: Julian Hy M.D.   On: 11/10/2013 20:38    EKG Interpretation   None       MDM   1. Sickle cell pain crisis    Labs above, anemia at baseline.  No chest pain or SOB-- low suspicion for acute chest syndrome.  VS have remained stable.  Pt given multiple doses of narcotic pain medications in the ED but denies improvement of her pain but does appear somewhat drowsy.  Will consult hospitalist for admission.  Spoke with Dr. Gevena Barre, will admit to Med-surg.  Temp admission orders placed.  Larene Pickett, PA-C 11/12/13 2200

## 2013-11-12 NOTE — ED Notes (Signed)
Pt c/o sickle cell pain crisis in arms and legs.

## 2013-11-12 NOTE — H&P (Signed)
[facility] History and Physical  ILMA ACHEE EVO:[id number] DOB: [birth year] DOA: 11/12/2013  Referring physician: Quincy Carnes, ER PA PCP: [name], MD   Chief Complaint: Pain in her arms and legs  HPI: Carmen [name] is a 23 y.o. female  With past medical history of sickle cell pain crisis was not been hospitalized for almost a year and normally controls her pain at home with when necessary Vicodin presented to the emergency room with complaints of pain in her arms and legs. Patient was seen several days ago in the emergency room for similar issues, but after getting treated day or, was discharged home. She started to do better, but then pain became quite severe to the point where she could not take it anymore and came into the emergency room. In the emergency room, she was noted to have white blood cell count of 24.7, up from 17.4 two days ago. Her hemoglobin was stable at 9, and her reticulocyte count was actually down from 322 two days ago to 262.4. Patient was given several doses of IV pain medication, but still her symptoms persisted. Hospitalists were called for further evaluation and admission.   Review of Systems:  Patient seen in emergency room. She is doing okay, complains of some generalized pain in her arms and legs. Some mild shortness of breath with exertion. No chest pain, headache, dizziness, dysphasia, palpitations, wheezing or cough. Denies any abdominal pain, hematuria, dysuria, constipation, diarrhea. Review systems otherwise negative.  Past Medical History  Diagnosis Date  . Sickle cell crisis   . Depression   . Headache(784.0)   . Abortion     05/2012   Past Surgical History  Procedure Laterality Date  . Splenectomy    . Splenectomy, total     Social History:  reports that she has never smoked. She has never used smokeless tobacco. She reports that she does not drink alcohol or use illicit drugs. she was at home with her mother  Allergies   Allergen Reactions  . Other Nausea Only    Patient stated that all iv pain medication make her have really bad nausea    Family History  Problem Relation Age of Onset  . Hypertension Mother   . Sickle cell anemia Sister   . Kidney disease Sister   . Arthritis Sister   . Sickle cell anemia Sister   . Sickle cell trait Sister   . Heart disease Maternal Aunt   . Heart disease Maternal Uncle      Prior to Admission medications   Medication Sig Start Date End Date Taking? Authorizing Provider  HYDROcodone-acetaminophen (NORCO/VICODIN) 5-325 MG per tablet Take 1-2 tablets every 6 hours as needed for severe pain 11/11/13  Yes Carlisle Cater, PA-C   Physical Exam: Filed Vitals:   11/12/13 2207  BP: 124/80  Pulse: 122  Temp: 99.1 F (37.3 C)  Resp: 20    BP 124/80  Pulse 122  Temp(Src) 99.1 F (37.3 C) (Oral)  Resp 20  Ht 5\' 3"  (1.6 m)  Wt 83.28 kg (183 lb 9.6 oz)  BMI 32.53 kg/m2  SpO2 100%  LMP 11/05/2013  General:  Alert and oriented x3, mild distress secondary to pain HEENT: Normocephalic, atraumatic, mucous membranes are slightly dry Cardiovascular: Regular rhythm, borderline tachycardia Lungs: Clear to auscultation bilaterally Abdomen: Soft, nontender, nondistended, positive bowel sounds Extremities: No clubbing or cyanosis, trace edema, some tenderness as, especially the lower extremities Neuro: No focal deficits Skin: No skin breaks, tears or lesions  Psych: Patient is appropriate, no evidence of psychoses           Labs on Admission:  Basic Metabolic Panel:  Recent Labs Lab 11/10/13 2020 11/12/13 1515  NA 137 135*  K 4.2 3.9  CL 101 97  CO2 25 23  GLUCOSE 82 111*  BUN 9 6  CREATININE 0.59 0.52  CALCIUM 9.0 9.3   Liver Function Tests:  Recent Labs Lab 11/10/13 2020 11/12/13 1515  AST 23 33  ALT 22 22  ALKPHOS 89 130*  BILITOT 0.7 1.7*  PROT 8.3 8.8*  ALBUMIN 3.7 3.9   CBC:  Recent Labs Lab 11/10/13 2210 11/12/13 1532  WBC 17.4*  24.7*  NEUTROABS 7.5 20.5*  HGB 8.9* 9.0*  HCT 26.3* 26.1*  MCV 82.4 81.6  PLT 808* 610*     Assessment/Plan Active Problems:   Sickle cell pain crisis: Pain and nausea control. Because patient has very few admissions and is actually very minimally opioid dependence, we'll go with scheduled low-dose Dilaudid every 4 hours. Started Hydrea, repeat labs including reticulocyte count in the morning   Sickle cell crisis   Code Status: Full code  Family Communication: Mother at the bedside.  Disposition Plan: Here for several days in the crisis results  Time spent: 25 minutes  [facility] Pager [phone]

## 2013-11-12 NOTE — ED Notes (Signed)
Transporter called.

## 2013-11-12 NOTE — ED Provider Notes (Signed)
The patient has history of sickle cell anemia. She states she normally does very well. She was seen in the ED 2 days ago and states she felt better when she left however when she got home she started feeling worse again when she woke up. She has pain in her arms and legs which is typical. She denies chest pain or fever. She reports she does not have insurance so she did not take any of her prescription medications which was Vicodin. She denies being on folic acid or hydroxyurea currently or in the past.  The patient is alert and cooperative she has some flat affect. She is using her extremities normally without deformity.  Medical screening examination/treatment/procedure(s) were conducted as a shared visit with non-physician practitioner(s) and myself.  I personally evaluated the patient during the encounter.  EKG Interpretation   None         Rolland Porter, MD, Abram Sander   Janice Norrie, MD 11/12/13 2042

## 2013-11-13 LAB — CULTURE, GROUP A STREP

## 2013-11-13 LAB — BASIC METABOLIC PANEL
BUN: 5 mg/dL — ABNORMAL LOW (ref 6–23)
CHLORIDE: 98 meq/L (ref 96–112)
CO2: 26 mEq/L (ref 19–32)
Calcium: 9 mg/dL (ref 8.4–10.5)
Creatinine, Ser: 0.53 mg/dL (ref 0.50–1.10)
GFR calc Af Amer: >90 mL/min (ref >90)
GFR calc non Af Amer: >90 mL/min (ref >90)
GLUCOSE: 104 mg/dL — AB (ref 70–99)
POTASSIUM: 3.6 meq/L — AB (ref 3.7–5.3)
Sodium: 136 mEq/L — ABNORMAL LOW (ref 137–147)

## 2013-11-13 LAB — CBC
HEMATOCRIT: 24.5 % — AB (ref 36.0–46.0)
HEMOGLOBIN: 8.6 g/dL — AB (ref 12.0–15.0)
MCH: 28 pg (ref 26.0–34.0)
MCHC: 35.1 g/dL (ref 30.0–36.0)
MCV: 79.8 fL (ref 78.0–100.0)
Platelets: 570 10*3/uL — ABNORMAL HIGH (ref 150–400)
RBC: 3.07 MIL/uL — AB (ref 3.87–5.11)
RDW: 17.2 % — ABNORMAL HIGH (ref 11.5–15.5)
WBC: 24.5 10*3/uL — AB (ref 4.0–10.5)

## 2013-11-13 LAB — RETICULOCYTES
RBC.: 3.07 MIL/uL — ABNORMAL LOW (ref 3.87–5.11)
Retic Count, Absolute: 254.8 10*3/uL — ABNORMAL HIGH (ref 19.0–186.0)
Retic Ct Pct: 8.3 % — ABNORMAL HIGH (ref 0.4–3.1)

## 2013-11-13 MED ORDER — CEPHALEXIN 500 MG PO CAPS: 500.0000 mg | ORAL_CAPSULE | Freq: Two times a day (BID) | ORAL | Status: DC

## 2013-11-13 MED ORDER — HYDROXYUREA 500 MG PO CAPS: 500.0000 mg | ORAL_CAPSULE | Freq: Two times a day (BID) | ORAL | Status: DC

## 2013-11-13 NOTE — Progress Notes (Signed)
Nutrition Brief Note  Patient identified on the Malnutrition Screening Tool (MST) Report  Wt Readings from Last 5 Encounters:  11/12/13 183 lb 9.6 oz (83.28 kg)  11/10/13 170 lb (77.111 kg)  11/02/13 160 lb (72.576 kg)  09/03/13 170 lb (77.111 kg)  06/12/13 195 lb (88.451 kg)    Body mass index is 32.53 kg/(m^2). Patient meets criteria for class I obesity based on current BMI.   Current diet order is regular, patient just finished ordering a late breakfast. Labs and medications reviewed. Potassium slightly low, total bilirubin slightly elevated. Admitted with sickle cell crisis.   Met with pt who reports not eating well yesterday due to pain and nausea. Before then she was eating regular, 3 meals/day with good appetite and stable weight. Pt without any nutritional concerns. Denies any nausea today. Expect pt will eat well during admission.   No nutrition interventions warranted at this time. If nutrition issues arise, please consult RD.   Mikey College MS, [place], [place] Pager [phone] After Hours Pager

## 2013-11-13 NOTE — ED Provider Notes (Signed)
See prior note   Tramel Westbrook L Yvana Samonte, MD 11/13/13 0011 

## 2013-11-13 NOTE — Discharge Summary (Signed)
Physician Discharge Summary  Carmen [name] OIN:[id number] DOB: 01-Feb-1991 DOA: 11/12/2013  PCP: [name], MD  Admit date: 11/12/2013 Discharge date: 11/13/2013  Time spent: > 35 minutes  Recommendations for Outpatient Follow-up:  1. Patient presented complaining of discomfort. No sources of infection identified. Should patient develop fevers or signs of infection she will be discharged with keflex which she can fill. 2. Follow up with WBC levels and ensure that they continue to trend down 3. Potassium level mildly decreased and 3.6 on day of discharge. Continue reassessing on hospital follow up.  Discharge Diagnoses:  Active Problems:   Sickle cell pain crisis   Sickle cell crisis   Discharge Condition: stable, patient walking and smiling  Diet recommendation: regular diet  Filed Weights   11/12/13 2207  Weight: 83.28 kg (183 lb 9.6 oz)    History of present illness:  23 y/o with PMH of sickle cell pain crisis who presented complaining of pain in her arms and legs.  Hospital Course:  Sickle cell pain crises - resolved today and patient requesting discharge. - no source of infection identified - leukocytosis most likely due to recent stress reaction. - Patient standing up in room smiling and requesting discharge as she denies any current pain to me. - Will recommend follow up with PCP for further evaluation and recommendations. - Given leukocytosis will provide script on discharge for antibiotic which the patient is to fill should be develop any fevers or signs of infection (dysuria, rash, etc)  Procedures:  None  Consultations:  None  Discharge Exam: Filed Vitals:   11/13/13 0531  BP: 125/78  Pulse: 101  Temp: 98.2 F (36.8 C)  Resp: 18    General: Pt in NAD, alert and awake Cardiovascular: RRR, no MRG Respiratory: CTA BL, no wheezes  Discharge Instructions  Discharge Orders   Future Orders Complete By Expires   Call MD for:  redness,  tenderness, or signs of infection (pain, swelling, redness, odor or green/yellow discharge around incision site)  As directed    Call MD for:  severe uncontrolled pain  As directed    Call MD for:  temperature >100.4  As directed    Diet - low sodium heart healthy  As directed    Discharge instructions  As directed    Comments:     Should patient develop any fevers or any signs of infection please fill prescription and take as directed, also follow up with your primary care physician should any new concerns arise.   Increase activity slowly  As directed        Medication List         cephALEXin 500 MG capsule  Commonly known as:  KEFLEX  Take 1 capsule (500 mg total) by mouth 2 (two) times daily.     HYDROcodone-acetaminophen 5-325 MG per tablet  Commonly known as:  NORCO/VICODIN  Take 1-2 tablets every 6 hours as needed for severe pain     hydroxyurea 500 MG capsule  Commonly known as:  HYDREA  Take 1 capsule (500 mg total) by mouth 2 (two) times daily. May take with food to minimize GI side effects.       Allergies  Allergen Reactions  . Other Nausea Only    Patient stated that all iv pain medication make her have really bad nausea      The results of significant diagnostics from this hospitalization (including imaging, microbiology, ancillary and laboratory) are listed below for reference.    Significant  Diagnostic Studies: Dg Chest 2 View  11/10/2013   CLINICAL DATA:  Cough, right shoulder pain  EXAM: CHEST  2 VIEW  COMPARISON:  11/02/2013  FINDINGS: Lungs are clear.  No pleural effusion or pneumothorax.  The heart is normal in size.  Visualized osseous structures are within normal limits.  IMPRESSION: Normal chest radiographs.   Electronically Signed   By: Julian Hy M.D.   On: 11/10/2013 20:38   Dg Chest 2 View  11/02/2013   CLINICAL DATA:  Chest and back pain.  EXAM: CHEST  2 VIEW  COMPARISON:  06/10/2013.  FINDINGS: The cardiac silhouette, mediastinal and  hilar contours are normal and stable. The lungs are clear. No pleural effusion. The bony thorax is intact.  IMPRESSION: No acute cardiopulmonary findings.   Electronically Signed   By: Kalman Jewels M.D.   On: 11/02/2013 22:23    Microbiology: Recent Results (from the past 240 hour(s))  RAPID STREP SCREEN     Status: None   Collection Time    11/10/13  8:21 PM      Result Value Range Status   Streptococcus, Group A Screen (Direct) NEGATIVE  NEGATIVE Final   Comment: (NOTE)     A Rapid Antigen test may result negative if the antigen level in the     sample is below the detection level of this test. The FDA has not     cleared this test as a stand-alone test therefore the rapid antigen     negative result has reflexed to a Group A Strep culture.  CULTURE, GROUP A STREP     Status: None   Collection Time    11/10/13  8:21 PM      Result Value Range Status   Specimen Description THROAT   Final   Special Requests NONE   Final   Culture     Final   Value: Culture reincubated for better growth     Performed at [facility]   Report Status PENDING   Incomplete     Labs: Basic Metabolic Panel:  Recent Labs Lab 11/10/13 2020 11/12/13 1515 11/13/13 0525  NA 137 135* 136*  K 4.2 3.9 3.6*  CL 101 97 98  CO2 25 23 26   GLUCOSE 82 111* 104*  BUN 9 6 5*  CREATININE 0.59 0.52 0.53  CALCIUM 9.0 9.3 9.0   Liver Function Tests:  Recent Labs Lab 11/10/13 2020 11/12/13 1515  AST 23 33  ALT 22 22  ALKPHOS 89 130*  BILITOT 0.7 1.7*  PROT 8.3 8.8*  ALBUMIN 3.7 3.9   No results found for this basename: LIPASE, AMYLASE,  in the last 168 hours No results found for this basename: AMMONIA,  in the last 168 hours CBC:  Recent Labs Lab 11/10/13 2210 11/12/13 1532 11/13/13 0525  WBC 17.4* 24.7* 24.5*  NEUTROABS 7.5 20.5*  --   HGB 8.9* 9.0* 8.6*  HCT 26.3* 26.1* 24.5*  MCV 82.4 81.6 79.8  PLT 808* 610* 570*   Cardiac Enzymes: No results found for this basename:  CKTOTAL, CKMB, CKMBINDEX, TROPONINI,  in the last 168 hours BNP: BNP (last 3 results) No results found for this basename: PROBNP,  in the last 8760 hours CBG: No results found for this basename: GLUCAP,  in the last 168 hours     Signed:  Velvet Bathe  [facility] 11/13/2013, 1:42 PM

## 2013-11-14 NOTE — ED Provider Notes (Signed)
Medical screening examination/treatment/procedure(s) were performed by non-physician practitioner and as supervising physician I was immediately available for consultation/collaboration.  Leota Jacobsen, MD 11/14/13 [phone]

## 2013-12-19 ENCOUNTER — Emergency Department (HOSPITAL_COMMUNITY)
Admission: EM | Admit: 2013-12-19 | Discharge: 2013-12-19 | Disposition: A | Discharge status: Home / Self Care | Payer: PRIVATE HEALTH INSURANCE | Attending: Emergency Medicine | Admitting: Emergency Medicine

## 2013-12-19 ENCOUNTER — Emergency Department (HOSPITAL_COMMUNITY): Payer: PRIVATE HEALTH INSURANCE

## 2013-12-19 ENCOUNTER — Encounter (HOSPITAL_COMMUNITY): Payer: Self-pay | Admitting: Emergency Medicine

## 2013-12-19 DIAGNOSIS — Z8659 Personal history of other mental and behavioral disorders: Secondary | ICD-10-CM | POA: Insufficient documentation

## 2013-12-19 DIAGNOSIS — Z8742 Personal history of other diseases of the female genital tract: Secondary | ICD-10-CM | POA: Insufficient documentation

## 2013-12-19 DIAGNOSIS — D5700 Hb-SS disease with crisis, unspecified: Secondary | ICD-10-CM | POA: Insufficient documentation

## 2013-12-19 DIAGNOSIS — Z8701 Personal history of pneumonia (recurrent): Secondary | ICD-10-CM | POA: Insufficient documentation

## 2013-12-19 DIAGNOSIS — R059 Cough, unspecified: Secondary | ICD-10-CM | POA: Insufficient documentation

## 2013-12-19 DIAGNOSIS — J3489 Other specified disorders of nose and nasal sinuses: Secondary | ICD-10-CM | POA: Insufficient documentation

## 2013-12-19 DIAGNOSIS — R05 Cough: Secondary | ICD-10-CM | POA: Insufficient documentation

## 2013-12-19 DIAGNOSIS — D72829 Elevated white blood cell count, unspecified: Secondary | ICD-10-CM | POA: Insufficient documentation

## 2013-12-19 DIAGNOSIS — R1110 Vomiting, unspecified: Secondary | ICD-10-CM | POA: Insufficient documentation

## 2013-12-19 LAB — BASIC METABOLIC PANEL
BUN: 6 mg/dL (ref 6–23)
CHLORIDE: 103 meq/L (ref 96–112)
CO2: 25 meq/L (ref 19–32)
CREATININE: 0.51 mg/dL (ref 0.50–1.10)
Calcium: 8.9 mg/dL (ref 8.4–10.5)
GFR calc Af Amer: >90 mL/min (ref >90)
GFR calc non Af Amer: >90 mL/min (ref >90)
Glucose, Bld: 102 mg/dL — ABNORMAL HIGH (ref 70–99)
Potassium: 4.1 mEq/L (ref 3.7–5.3)
Sodium: 139 mEq/L (ref 137–147)

## 2013-12-19 LAB — CBC WITH DIFFERENTIAL/PLATELET
Basophils Absolute: 0.1 10*3/uL (ref 0.0–0.1)
Basophils Relative: 1 % (ref 0–1)
EOS PCT: 1 % (ref 0–5)
Eosinophils Absolute: 0.1 10*3/uL (ref 0.0–0.7)
HEMATOCRIT: 23.7 % — AB (ref 36.0–46.0)
Hemoglobin: 8.3 g/dL — ABNORMAL LOW (ref 12.0–15.0)
Lymphocytes Relative: 43 % (ref 12–46)
Lymphs Abs: 6.2 10*3/uL — ABNORMAL HIGH (ref 0.7–4.0)
MCH: 29.1 pg (ref 26.0–34.0)
MCHC: 35 g/dL (ref 30.0–36.0)
MCV: 83.2 fL (ref 78.0–100.0)
MONOS PCT: 12 % (ref 3–12)
Monocytes Absolute: 1.7 10*3/uL — ABNORMAL HIGH (ref 0.1–1.0)
NEUTROS ABS: 6.4 10*3/uL (ref 1.7–7.7)
Neutrophils Relative %: 43 % (ref 43–77)
PLATELETS: 579 10*3/uL — AB (ref 150–400)
RBC: 2.85 MIL/uL — AB (ref 3.87–5.11)
RDW: 19.5 % — ABNORMAL HIGH (ref 11.5–15.5)
WBC: 14.5 10*3/uL — AB (ref 4.0–10.5)
nRBC: 5 /100 WBC — ABNORMAL HIGH

## 2013-12-19 LAB — RETICULOCYTES
RBC.: 2.85 MIL/uL — ABNORMAL LOW (ref 3.87–5.11)
RETIC COUNT ABSOLUTE: 447.5 10*3/uL — AB (ref 19.0–186.0)
RETIC CT PCT: 15.7 % — AB (ref 0.4–3.1)

## 2013-12-19 MED ORDER — HYDROMORPHONE HCL PF 1 MG/ML IJ SOLN
1.0000 mg | INTRAMUSCULAR | Status: AC | PRN
  Administered 2013-12-19 (×3): 1 mg via INTRAVENOUS
  Filled 2013-12-19 (×3): qty 1

## 2013-12-19 MED ORDER — ONDANSETRON HCL 4 MG/2ML IJ SOLN
4.0000 mg | Freq: Once | INTRAMUSCULAR | Status: AC
Start: 2013-12-19 — End: 2013-12-19
  Administered 2013-12-19: 4 mg via INTRAVENOUS
  Filled 2013-12-19: qty 2

## 2013-12-19 MED ORDER — ONDANSETRON HCL 4 MG/2ML IJ SOLN
4.0000 mg | Freq: Once | INTRAMUSCULAR | Status: AC
  Administered 2013-12-19: 4 mg via INTRAVENOUS
  Filled 2013-12-19: qty 2

## 2013-12-19 MED ORDER — SODIUM CHLORIDE 0.9 % IV BOLUS (SEPSIS)
1000.0000 mL | Freq: Once | INTRAVENOUS | Status: AC
  Administered 2013-12-19: 1000 mL via INTRAVENOUS

## 2013-12-19 NOTE — ED Notes (Signed)
Bed: WA03 Expected date:  Expected time:  Means of arrival:  Comments: [facility]

## 2013-12-19 NOTE — ED Provider Notes (Signed)
CSN: [id number]     Arrival date & time 12/19/13  6283 History   First MD Initiated Contact with Patient 12/19/13 [phone]     Chief Complaint  Patient presents with  . Sickle Cell Pain Crisis     (Consider location/radiation/quality/duration/timing/severity/associated sxs/prior Treatment) HPI Comments: 23 year old female presents with chest pain started about one hour ago and woke her up from sleep. Patient states this pain is a 10 out of 10. It is severe and sharp in nature. It is not pleuritic. She has sickle cell disease and follows with Dr. Alyson Ingles. Patient states that typically she gets sickle cell pain crises in her legs but occasionally she is in her chest. One time she had acute chest with pneumonia but the pain was much worse than it is now. Denies any current shortness of breath. She has chronic sinus problems and a chronic cough but neither of these are worse today. No fevers. No leg swelling, leg pain, or history of DVT/PE. She took some tylenol PTA with minimal success.   Past Medical History  Diagnosis Date  . Sickle cell crisis   . Depression   . Headache(784.0)   . Abortion     05/2012   Past Surgical History  Procedure Laterality Date  . Splenectomy    . Splenectomy, total     Family History  Problem Relation Age of Onset  . Hypertension Mother   . Sickle cell anemia Sister   . Kidney disease Sister   . Arthritis Sister   . Sickle cell anemia Sister   . Sickle cell trait Sister   . Heart disease Maternal Aunt   . Heart disease Maternal Uncle    History  Substance Use Topics  . Smoking status: Never Smoker   . Smokeless tobacco: Never Used  . Alcohol Use: No   OB History   Grav Para Term Preterm Abortions TAB SAB Ect Mult Living                 Review of Systems  Constitutional: Negative for fever.  HENT: Positive for congestion.   Respiratory: Positive for cough. Negative for shortness of breath.   Cardiovascular: Positive for chest pain. Negative for  leg swelling.  Gastrointestinal: Positive for vomiting (yesterday). Negative for abdominal pain.  Genitourinary: Negative for dysuria.  Musculoskeletal: Negative for back pain.  Neurological: Negative for weakness.  All other systems reviewed and are negative.      Allergies  Other  Home Medications  No current outpatient prescriptions on file. BP 115/71  Pulse 88  Temp(Src) 98.4 F (36.9 C) (Oral)  Resp 20  SpO2 100% Physical Exam  Vitals reviewed. Constitutional: She is oriented to person, place, and time. She appears well-developed and well-nourished. No distress.  HENT:  Head: Normocephalic and atraumatic.  Right Ear: External ear normal.  Left Ear: External ear normal.  Nose: Nose normal.  Eyes: Right eye exhibits no discharge. Left eye exhibits no discharge.  Cardiovascular: Normal rate, regular rhythm and normal heart sounds.   Pulmonary/Chest: Effort normal and breath sounds normal. She has no wheezes. She has no rales. She exhibits tenderness.  Abdominal: Soft. She exhibits no distension. There is no tenderness.  Musculoskeletal: She exhibits no edema and no tenderness.  Neurological: She is alert and oriented to person, place, and time.  Skin: Skin is warm and dry.    ED Course  Procedures (including critical care time) Labs Review Labs Reviewed  BASIC METABOLIC PANEL - Abnormal; Notable for the  following:    Glucose, Bld 102 (*)    All other components within normal limits  RETICULOCYTES - Abnormal; Notable for the following:    Retic Ct Pct 15.7 (*)    RBC. 2.85 (*)    Retic Count, Manual 447.5 (*)    All other components within normal limits  CBC WITH DIFFERENTIAL - Abnormal; Notable for the following:    WBC 14.5 (*)    RBC 2.85 (*)    Hemoglobin 8.3 (*)    HCT 23.7 (*)    RDW 19.5 (*)    Platelets 579 (*)    nRBC 5 (*)    Lymphs Abs 6.2 (*)    Monocytes Absolute 1.7 (*)    All other components within normal limits   Imaging Review Dg  Chest 2 View  12/19/2013   CLINICAL DATA:  Chest pain with sickle cell crisis  EXAM: CHEST  2 VIEW  COMPARISON:  November 10, 2013  FINDINGS: Lungs are clear. The heart size and pulmonary vascularity are normal. No adenopathy. No pneumothorax. There are several subtle endplate defects in the thoracic region, probably representing early bone infarcts from known sickle cell disease. There is absence of splenic tissue apparent.  IMPRESSION: Early bony changes of sickle cell disease in the thoracic spine. Lungs clear.   Electronically Signed   By: Lowella Grip M.D.   On: 12/19/2013 10:33    EKG Interpretation    Date/Time:  Wednesday December 19 2013 09:50:43 EST Ventricular Rate:  96 PR Interval:  166 QRS Duration: 82 QT Interval:  352 QTC Calculation: 445 R Axis:   40 Text Interpretation:  Sinus rhythm No significant change since last tracing Confirmed by Manati ([id number]) on 12/19/2013 9:54:06 AM            MDM   Final diagnoses:  Sickle cell pain crisis    Pain significantly improved in ED with IV pain control. WBC mildly elevated, around her normal elevation. CXR and EKG benign for acute disease. I have low suspicion for acute chest. She did have some vomiting after pain meds which was controlled with zofran. Prefers to go home, feel she is stable for outpatient management with her hematologist.    Ephraim Hamburger, MD 12/19/13 [phone]

## 2013-12-19 NOTE — ED Notes (Signed)
MD at bedside. 

## 2013-12-19 NOTE — ED Notes (Addendum)
Pt falling asleep and snoring while starting [place] IV witnessed by this Probation officer and Virgilio Frees. Pt is NAD

## 2013-12-19 NOTE — ED Notes (Signed)
MD at bedside. Aware of emesis nausea med given

## 2013-12-19 NOTE — ED Notes (Signed)
Sickle cell crisis since 9am. Has not had crisis for 3 years. C/o of genralized chest sharp pain.

## 2014-01-29 ENCOUNTER — Encounter (HOSPITAL_COMMUNITY): Payer: Self-pay | Admitting: Emergency Medicine

## 2014-01-29 ENCOUNTER — Emergency Department (HOSPITAL_COMMUNITY)
Admission: EM | Admit: 2014-01-29 | Discharge: 2014-01-29 | Disposition: A | Discharge status: Home / Self Care | Payer: PRIVATE HEALTH INSURANCE | Attending: Emergency Medicine | Admitting: Emergency Medicine

## 2014-01-29 DIAGNOSIS — N750 Cyst of Bartholin's gland: Secondary | ICD-10-CM

## 2014-01-29 DIAGNOSIS — Z8659 Personal history of other mental and behavioral disorders: Secondary | ICD-10-CM | POA: Insufficient documentation

## 2014-01-29 DIAGNOSIS — D5700 Hb-SS disease with crisis, unspecified: Secondary | ICD-10-CM | POA: Insufficient documentation

## 2014-01-29 MED ORDER — METRONIDAZOLE 500 MG PO TABS: 500.0000 mg | ORAL_TABLET | Freq: Two times a day (BID) | ORAL | Status: DC

## 2014-01-29 NOTE — Discharge Instructions (Signed)
Please read and follow all provided instructions.  Your diagnoses today include:  1. Bartholin's gland cyst     Tests performed today include:  Vital signs. See below for your results today.   Medications prescribed:   Flagyl - antibiotic  You have been prescribed an antibiotic medicine: take the entire course of medicine even if you are feeling better. Stopping early can cause the antibiotic not to work.  Take any prescribed medications only as directed.   Home care instructions:   Follow any educational materials contained in this packet  Follow-up instructions: Return to the Emergency Department in 72 hours for a recheck if your symptoms are not significantly improved.  Please follow-up with your primary care provider in the next 1 week for further evaluation of your symptoms. If you do not have a primary care doctor -- see below for referral information.   Return instructions:  Return to the Emergency Department if you have:  Fever  Worsening symptoms  Worsening pain  Worsening swelling  Redness of the skin that moves away from the affected area, especially if it streaks away from the affected area   Any other emergent concerns  Your vital signs today were: BP 122/66   Pulse 100   Temp(Src) 98.3 F (36.8 C) (Oral)   Wt 135 lb (61.236 kg)   SpO2 98%   LMP 01/01/2014 If your blood pressure (BP) was elevated above 135/85 this visit, please have this repeated by your doctor within one month. --------------

## 2014-01-29 NOTE — ED Notes (Signed)
Pt states she thinks she cut her vaginal area while shaving; c/o irritation and pain

## 2014-01-29 NOTE — ED Provider Notes (Signed)
CSN: [id number]     Arrival date & time 01/29/14  1411 History  This chart was scribed for non-physician practitioner hydronititis. working with Mervin Kung, MD, by Neta Ehlers, ED Scribe. This patient was seen in room [place] and the patient's care was started at 3:10 PM.   First MD Initiated Contact with Patient 01/29/14 1433     Chief Complaint  Patient presents with  . vaginal irritation      The history is provided by the patient. No language interpreter was used.   HPI Comments: Carmen [name] is a 23 y.o. female, with a h/o sickle cell disease, who presents to the Emergency Department complaining of a lump to her right labia which she first noticed yesterday evening while she was shaving. The bleeding was well-controlled. She reports mild pain with pressure. She has not treated the area with any medication. She has also experienced pain to her legs bilaterally due to sickle cell disease; she reports her pain is under control with medication and she rates the pain as 3/10. She denies fever. Onset is acute. Course is constant. Aggravating factors are none. Alleviating factors are none.   Past Medical History  Diagnosis Date  . Sickle cell crisis   . Depression   . Headache(784.0)   . Abortion     05/2012   Past Surgical History  Procedure Laterality Date  . Splenectomy    . Splenectomy, total     Family History  Problem Relation Age of Onset  . Hypertension Mother   . Sickle cell anemia Sister   . Kidney disease Sister   . Arthritis Sister   . Sickle cell anemia Sister   . Sickle cell trait Sister   . Heart disease Maternal Aunt   . Heart disease Maternal Uncle    History  Substance Use Topics  . Smoking status: Never Smoker   . Smokeless tobacco: Never Used  . Alcohol Use: No   No OB history provided.  Review of Systems  Constitutional: Negative for fever.  Gastrointestinal: Negative for nausea and vomiting.  Genitourinary: Negative for vaginal pain.   Musculoskeletal: Positive for myalgias.  Skin: Positive for wound. Negative for color change.       Positive for abscess.  Hematological: Negative for adenopathy.   Allergies  Other  Home Medications  No current outpatient prescriptions on file.  Triage Vitals: BP 122/66  Pulse 100  Temp(Src) 98.3 F (36.8 C) (Oral)  Wt 135 lb (61.236 kg)  SpO2 98%  LMP 01/01/2014  Physical Exam  Nursing note and vitals reviewed. Constitutional: She appears well-developed and well-nourished. No distress.  HENT:  Head: Normocephalic and atraumatic.  Eyes: Conjunctivae and EOM are normal.  Neck: Normal range of motion. Neck supple. No tracheal deviation present.  Cardiovascular: Normal rate.   Pulmonary/Chest: Effort normal. No respiratory distress.  Musculoskeletal: Normal range of motion.  Neurological: She is alert.  Skin: Skin is warm and dry.  Right labia: Painless, mildly erythmatous nodule consistent with early Bartholin Gland cyst. Non-fluctuent.  Psychiatric: She has a normal mood and affect. Her behavior is normal.    ED Course  Procedures (including critical care time)  DIAGNOSTIC STUDIES: Oxygen Saturation is 98% on room air, normal by my interpretation.    COORDINATION OF CARE:  3:15 PM- Discussed treatment plan with patient, and the patient agreed to the plan. The plan includes warm soaks and antibiotics.   Labs Review Labs Reviewed - No data to display Imaging Review  No results found.   EKG Interpretation None      3:31 PM Patient seen and examined.   Vital signs reviewed and are as follows: Filed Vitals:   01/29/14 1428  BP: 122/66  Pulse: 100  Temp: 98.3 F (36.8 C)   Patient to treat at home for antibiotics and sitz baths. Patient aware that she will need to return if the area continues to enlarge and becomes painful. In his case, she may need incision and drainage and placement of a Word catheter.   MDM   Final diagnoses:  Bartholin's gland  cyst   Patient with area consistent with early swollen Bartholin's gland cyst. Mild erythema. The area is not painful and it is very small. It is nonfluctuant. At this point I do not feel that incision and drainage would be of benefit for this patient. She will continue conservative care at home and return if the area becomes worse. No systemic symptoms otherwise.  Patient has history of sickle cell and had an increase in her sickle cell pain. She states that this is well-controlled with Vicodin and she is not here to be treated for this pain  I personally performed the services described in this documentation, which was scribed in my presence. The recorded information has been reviewed and is accurate.    Carlisle Cater, PA-C 01/29/14 [phone]

## 2014-01-29 NOTE — ED Provider Notes (Signed)
Medical screening examination/treatment/procedure(s) were performed by non-physician practitioner and as supervising physician I was immediately available for consultation/collaboration.  Mickel Schreur L Carrye Goller, MD 01/29/14 2331 

## 2014-02-03 ENCOUNTER — Emergency Department (HOSPITAL_COMMUNITY): Payer: PRIVATE HEALTH INSURANCE

## 2014-02-03 ENCOUNTER — Encounter (HOSPITAL_COMMUNITY): Payer: Self-pay | Admitting: Emergency Medicine

## 2014-02-03 ENCOUNTER — Other Ambulatory Visit: Payer: Self-pay

## 2014-02-03 ENCOUNTER — Emergency Department (HOSPITAL_COMMUNITY)
Admission: EM | Admit: 2014-02-03 | Discharge: 2014-02-03 | Disposition: A | Discharge status: Home / Self Care | Payer: PRIVATE HEALTH INSURANCE | Attending: Emergency Medicine | Admitting: Emergency Medicine

## 2014-02-03 DIAGNOSIS — D5700 Hb-SS disease with crisis, unspecified: Secondary | ICD-10-CM | POA: Insufficient documentation

## 2014-02-03 DIAGNOSIS — Z792 Long term (current) use of antibiotics: Secondary | ICD-10-CM | POA: Insufficient documentation

## 2014-02-03 DIAGNOSIS — Z3202 Encounter for pregnancy test, result negative: Secondary | ICD-10-CM | POA: Insufficient documentation

## 2014-02-03 DIAGNOSIS — Z8659 Personal history of other mental and behavioral disorders: Secondary | ICD-10-CM | POA: Insufficient documentation

## 2014-02-03 LAB — COMPREHENSIVE METABOLIC PANEL
ALT: 17 U/L (ref 0–35)
AST: 29 U/L (ref 0–37)
Albumin: 4 g/dL (ref 3.5–5.2)
Alkaline Phosphatase: 89 U/L (ref 39–117)
BUN: 13 mg/dL (ref 6–23)
CALCIUM: 8.9 mg/dL (ref 8.4–10.5)
CO2: 23 mEq/L (ref 19–32)
Chloride: 108 mEq/L (ref 96–112)
Creatinine, Ser: 0.58 mg/dL (ref 0.50–1.10)
GLUCOSE: 83 mg/dL (ref 70–99)
Potassium: 3.9 mEq/L (ref 3.7–5.3)
SODIUM: 142 meq/L (ref 137–147)
Total Bilirubin: 1.2 mg/dL (ref 0.3–1.2)
Total Protein: 7.8 g/dL (ref 6.0–8.3)

## 2014-02-03 LAB — URINALYSIS, ROUTINE W REFLEX MICROSCOPIC
BILIRUBIN URINE: NEGATIVE
Glucose, UA: NEGATIVE mg/dL
KETONES UR: NEGATIVE mg/dL
NITRITE: NEGATIVE
PROTEIN: NEGATIVE mg/dL
Specific Gravity, Urine: 1.012 (ref 1.005–1.030)
UROBILINOGEN UA: 1 mg/dL (ref 0.0–1.0)
pH: 7 (ref 5.0–8.0)

## 2014-02-03 LAB — CBC WITH DIFFERENTIAL/PLATELET
BASOS ABS: 0.1 10*3/uL (ref 0.0–0.1)
Basophils Relative: 1 % (ref 0–1)
EOS ABS: 0.2 10*3/uL (ref 0.0–0.7)
EOS PCT: 1 % (ref 0–5)
HCT: 25.6 % — ABNORMAL LOW (ref 36.0–46.0)
Hemoglobin: 9 g/dL — ABNORMAL LOW (ref 12.0–15.0)
LYMPHS ABS: 5.1 10*3/uL — AB (ref 0.7–4.0)
Lymphocytes Relative: 36 % (ref 12–46)
MCH: 29 pg (ref 26.0–34.0)
MCHC: 35.2 g/dL (ref 30.0–36.0)
MCV: 82.6 fL (ref 78.0–100.0)
Monocytes Absolute: 1.4 10*3/uL — ABNORMAL HIGH (ref 0.1–1.0)
Monocytes Relative: 10 % (ref 3–12)
Neutro Abs: 7.3 10*3/uL (ref 1.7–7.7)
Neutrophils Relative %: 52 % (ref 43–77)
PLATELETS: 626 10*3/uL — AB (ref 150–400)
RBC: 3.1 MIL/uL — ABNORMAL LOW (ref 3.87–5.11)
RDW: 17.1 % — AB (ref 11.5–15.5)
WBC: 13.9 10*3/uL — ABNORMAL HIGH (ref 4.0–10.5)

## 2014-02-03 LAB — URINE MICROSCOPIC-ADD ON

## 2014-02-03 LAB — RETICULOCYTES
RBC.: 3.1 MIL/uL — ABNORMAL LOW (ref 3.87–5.11)
Retic Count, Absolute: 322.4 10*3/uL — ABNORMAL HIGH (ref 19.0–186.0)
Retic Ct Pct: 10.4 % — ABNORMAL HIGH (ref 0.4–3.1)

## 2014-02-03 LAB — PREGNANCY, URINE: Preg Test, Ur: NEGATIVE

## 2014-02-03 LAB — D-DIMER, QUANTITATIVE: D-Dimer, Quant: 2.05 ug/mL-FEU — ABNORMAL HIGH (ref 0.00–0.48)

## 2014-02-03 MED ORDER — HYDROMORPHONE HCL PF 1 MG/ML IJ SOLN
1.0000 mg | Freq: Once | INTRAMUSCULAR | Status: AC
  Administered 2014-02-03: 1 mg via INTRAVENOUS
  Filled 2014-02-03: qty 1

## 2014-02-03 MED ORDER — KETOROLAC TROMETHAMINE 30 MG/ML IJ SOLN
30.0000 mg | Freq: Once | INTRAMUSCULAR | Status: AC
  Administered 2014-02-03: 30 mg via INTRAVENOUS
  Filled 2014-02-03: qty 1

## 2014-02-03 MED ORDER — HYDROMORPHONE HCL PF 2 MG/ML IJ SOLN
2.0000 mg | Freq: Once | INTRAMUSCULAR | Status: AC
  Administered 2014-02-03: 2 mg via INTRAVENOUS
  Filled 2014-02-03: qty 1

## 2014-02-03 MED ORDER — ONDANSETRON HCL 4 MG/2ML IJ SOLN
INTRAMUSCULAR | Status: AC
  Filled 2014-02-03: qty 2

## 2014-02-03 MED ORDER — ONDANSETRON HCL 4 MG/2ML IJ SOLN
4.0000 mg | Freq: Once | INTRAMUSCULAR | Status: AC
  Administered 2014-02-03: 4 mg via INTRAVENOUS

## 2014-02-03 MED ORDER — ONDANSETRON HCL 4 MG/2ML IJ SOLN
4.0000 mg | Freq: Once | INTRAMUSCULAR | Status: AC
  Administered 2014-02-03: 4 mg via INTRAVENOUS
  Filled 2014-02-03: qty 2

## 2014-02-03 MED ORDER — IOHEXOL 350 MG/ML SOLN
100.0000 mL | Freq: Once | INTRAVENOUS | Status: AC | PRN
  Administered 2014-02-03: 100 mL via INTRAVENOUS

## 2014-02-03 MED ORDER — SODIUM CHLORIDE 0.9 % IV BOLUS (SEPSIS)
1000.0000 mL | Freq: Once | INTRAVENOUS | Status: AC
  Administered 2014-02-03: 1000 mL via INTRAVENOUS

## 2014-02-03 NOTE — ED Notes (Signed)
Report given to Jannifer Rodney., RN

## 2014-02-03 NOTE — Discharge Instructions (Signed)
Sickle Cell Anemia, Adult °Sickle cell anemia is a condition in which red blood cells have an abnormal "sickle" shape. This abnormal shape shortens the cells' life span, which results in a lower than normal concentration of red blood cells in the blood. The sickle shape also causes the cells to clump together and block free blood flow through the blood vessels. As a result, the tissues and organs of the body do not receive enough oxygen. Sickle cell anemia causes organ damage and pain and increases the risk of infection. °CAUSES  °Sickle cell anemia is a genetic disorder. Those who receive two copies of the gene have the condition, and those who receive one copy have the trait. °RISK FACTORS °The sickle cell gene is most common in people whose families originated in [place]. Other areas of the globe where sickle cell trait occurs include the [place], [place] and [place], the [place], and the [place].  °SIGNS AND SYMPTOMS °· Pain, especially in the extremities, back, chest, or abdomen (common). The pain may start suddenly or may develop following an illness, especially if there is dehydration. Pain can also occur due to overexertion or exposure to extreme temperature changes. °· Frequent severe bacterial infections, especially certain types of pneumonia and meningitis. °· Pain and swelling in the hands and feet. °· Decreased activity.   °· Loss of appetite.   °· Change in behavior. °· Headaches. °· Seizures. °· Shortness of breath or difficulty breathing. °· Vision changes. °· Skin ulcers. °Those with the trait may not have symptoms or they may have mild symptoms.  °DIAGNOSIS  °Sickle cell anemia is diagnosed with blood tests that demonstrate the genetic trait. It is often diagnosed during the newborn period, due to mandatory testing nationwide. A variety of blood tests, X-rays, CT scans, MRI scans, ultrasounds, and lung function tests may also be done to monitor the condition. °TREATMENT  °Sickle  cell anemia may be treated with: °· Medicines. You may be given pain medicines, antibiotic medicines (to treat and prevent infections) or medicines to increase the production of certain types of hemoglobin. °· Fluids. °· Oxygen. °· Blood transfusions. °HOME CARE INSTRUCTIONS  °· Drink enough fluid to keep your urine clear or pale yellow. Increase your fluid intake in hot weather and during exercise. °· Do not smoke. Smoking lowers oxygen levels in the blood.   °· Only take over-the-counter or prescription medicines for pain, fever, or discomfort as directed by your health care provider. °· Take antibiotics as directed by your health care provider. Make sure you finish them it even if you start to feel better.   °· Take supplements as directed by your health care provider.   °· Consider wearing a medical alert bracelet. This tells anyone caring for you in an emergency of your condition.   °· When traveling, keep your medical information, health care provider's names, and the medicines you take with you at all times.   °· If you develop a fever, do not take medicines to reduce the fever right away. This could cover up a problem that is developing. Notify your health care provider. °· Keep all follow-up appointments with your health care provider. Sickle cell anemia requires regular medical care. °SEEK MEDICAL CARE IF: ° You have a fever. °SEEK IMMEDIATE MEDICAL CARE IF:  °· You feel dizzy or faint.   °· You have new abdominal pain, especially on the left side near the stomach area.   °· You develop a persistent, often uncomfortable and painful penile erection (priapism). If this is not treated immediately it   will lead to impotence.   °· You have numbness your arms or legs or you have a hard time moving them.   °· You have a hard time with speech.   °· You have a fever or persistent symptoms for more than 2 3 days.   °· You have a fever and your symptoms suddenly get worse.   °· You have signs or symptoms of infection.  These include:   °· Chills.   °· Abnormal tiredness (lethargy).   °· Irritability.   °· Poor eating.   °· Vomiting.   °· You develop pain that is not helped with medicine.   °· You develop shortness of breath. °· You have pain in your chest.   °· You are coughing up pus-like or bloody sputum.   °· You develop a stiff neck. °· Your feet or hands swell or have pain. °· Your abdomen appears bloated. °· You develop joint pain. °MAKE SURE YOU: °· Understand these instructions. °· Will watch your child's condition. °· Will get help right away if your child is not doing well or gets worse. °Document Released: 01/26/2006 Document Revised: 08/08/2013 Document Reviewed: 05/30/2013 °[employer]® Patient Information ©2014 [employer]. ° °

## 2014-02-03 NOTE — ED Notes (Signed)
Pt w/ n/v s/p apple sauce and ginger ale - Dr. Wyvonnia Dusky made aware - VO given for 4mg  zofran IVP. Awaiting pt's sister to drive pt home upon discharge.

## 2014-02-03 NOTE — ED Notes (Signed)
Patient transported to CT 

## 2014-02-03 NOTE — ED Notes (Signed)
Pt ambulating independently w/ steady gait on d/c in no acute distress, A&Ox4.D/c instructions reviewed w/ pt and family - pt and family deny any further questions or concerns at present.  

## 2014-02-03 NOTE — ED Provider Notes (Signed)
CSN: [id number]     Arrival date & time 02/03/14  0411 History   First MD Initiated Contact with Patient 02/03/14 [phone]     Chief Complaint  Patient presents with  . Sickle Cell Pain Crisis     (Consider location/radiation/quality/duration/timing/severity/associated sxs/prior Treatment) HPI Comments: Patient presents with "sickle cell pain" that woke her up around 3 AM. The pain is in her back, joints and chest. She currently gets chest pain with her sickle cell pain. She denies any cough or fever. She denies any shortness of breath. She's been taking Vicodin at home without relief. She denies any nausea, vomiting or abdominal pain. She denies taking any more Vicodin than she is supposed to be  Though she did double one dose. denies any headache, vision change. The pain is worse with palpation and movement. It is not pleuritic. She's on any birth control. No history of blood clots. No leg pain or leg swelling.   The history is provided by the patient.    Past Medical History  Diagnosis Date  . Sickle cell crisis   . Depression   . Headache(784.0)   . Abortion     05/2012   Past Surgical History  Procedure Laterality Date  . Splenectomy    . Splenectomy, total     Family History  Problem Relation Age of Onset  . Hypertension Mother   . Sickle cell anemia Sister   . Kidney disease Sister   . Arthritis Sister   . Sickle cell anemia Sister   . Sickle cell trait Sister   . Heart disease Maternal Aunt   . Heart disease Maternal Uncle    History  Substance Use Topics  . Smoking status: Never Smoker   . Smokeless tobacco: Never Used  . Alcohol Use: No   OB History   Grav Para Term Preterm Abortions TAB SAB Ect Mult Living                 Review of Systems  Constitutional: Negative for fever, activity change and appetite change.  HENT: Negative for congestion and rhinorrhea.   Respiratory: Positive for chest tightness. Negative for cough and shortness of breath.    Cardiovascular: Positive for chest pain.  Gastrointestinal: Negative for nausea, vomiting and abdominal pain.  Genitourinary: Negative for dysuria and hematuria.  Musculoskeletal: Positive for arthralgias and myalgias.  Skin: Negative for rash.  Neurological: Negative for dizziness, weakness and headaches.  A complete 10 system review of systems was obtained and all systems are negative except as noted in the HPI and PMH.      Allergies  Other  Home Medications   Current Outpatient Rx  Name  Route  Sig  Dispense  Refill  . HYDROcodone-acetaminophen (NORCO/VICODIN) 5-325 MG per tablet   Oral   Take 1 tablet by mouth every 4 (four) hours as needed for moderate pain.         . metroNIDAZOLE (FLAGYL) 500 MG tablet   Oral   Take 1 tablet (500 mg total) by mouth 2 (two) times daily.   14 tablet   0    BP 102/53  Pulse 85  Temp(Src) 98.1 F (36.7 C) (Oral)  Resp 17  SpO2 98%  LMP 02/03/2014 Physical Exam  Constitutional: She is oriented to person, place, and time. She appears well-developed and well-nourished. No distress.  HENT:  Head: Normocephalic and atraumatic.  Mouth/Throat: Oropharynx is clear and moist. No oropharyngeal exudate.  Eyes: Conjunctivae and EOM are normal. Pupils  are equal, round, and reactive to light.  Neck: Normal range of motion. Neck supple.  Cardiovascular: Normal rate, regular rhythm and normal heart sounds.   No murmur heard. Pulmonary/Chest: Effort normal and breath sounds normal. No respiratory distress. She exhibits tenderness.  Abdominal: Soft. There is no tenderness. There is no rebound and no guarding.  Musculoskeletal: Normal range of motion. She exhibits no edema and no tenderness.  FROM joints without erythema or edema  Neurological: She is alert and oriented to person, place, and time. No cranial nerve deficit. She exhibits normal muscle tone. Coordination normal.  Skin: Skin is warm.    ED Course  Procedures (including critical  care time) Labs Review Labs Reviewed  CBC WITH DIFFERENTIAL - Abnormal; Notable for the following:    WBC 13.9 (*)    RBC 3.10 (*)    Hemoglobin 9.0 (*)    HCT 25.6 (*)    RDW 17.1 (*)    Platelets 626 (*)    Lymphs Abs 5.1 (*)    Monocytes Absolute 1.4 (*)    All other components within normal limits  RETICULOCYTES - Abnormal; Notable for the following:    Retic Ct Pct 10.4 (*)    RBC. 3.10 (*)    Retic Count, Manual 322.4 (*)    All other components within normal limits  URINALYSIS, ROUTINE W REFLEX MICROSCOPIC - Abnormal; Notable for the following:    Hgb urine dipstick LARGE (*)    Leukocytes, UA TRACE (*)    All other components within normal limits  URINE MICROSCOPIC-ADD ON - Abnormal; Notable for the following:    Squamous Epithelial / LPF FEW (*)    All other components within normal limits  D-DIMER, QUANTITATIVE - Abnormal; Notable for the following:    D-Dimer, Quant 2.05 (*)    All other components within normal limits  COMPREHENSIVE METABOLIC PANEL  PREGNANCY, URINE   Imaging Review Dg Chest 2 View  02/03/2014   CLINICAL DATA:  Chest pain, sickle cell crisis  EXAM: CHEST  2 VIEW  COMPARISON:  12/19/2013  FINDINGS: Cardiac shadow is within normal limits. The lungs are clear bilaterally. The osseous structures show no acute abnormality. Chronic changes consistent with the underlying clinical history are noted.  IMPRESSION: No active cardiopulmonary disease.   Electronically Signed   By: Inez Catalina M.D.   On: 02/03/2014 07:56   Ct Angio Chest Pe W/cm &/or Wo Cm  02/03/2014   CLINICAL DATA:  Sickle cell pain, possible pulmonary embolus  EXAM: CT ANGIOGRAPHY CHEST WITH CONTRAST  TECHNIQUE: Multidetector CT imaging of the chest was performed using the standard protocol during bolus administration of intravenous contrast. Multiplanar CT image reconstructions and MIPs were obtained to evaluate the vascular anatomy.  CONTRAST:  162mL OMNIPAQUE IOHEXOL 350 MG/ML SOLN   COMPARISON:  11/19/2012  FINDINGS: Sagittal images of the spine are unremarkable. Sagittal view of the sternum is unremarkable. Images of the thoracic inlet are unremarkable. Central airways are patent. No mediastinal hematoma or adenopathy. Heart size within normal limits.  No pericardial effusion.  The visualized upper abdomen is unremarkable.  Mild atelectasis noted lung bases posteriorly right greater than left.  Images of the lung parenchyma shows no acute infiltrate or pleural effusion. No pulmonary edema.  The study is of excellent technical quality. No pulmonary embolus is noted. Nonspecific small axillary lymph nodes are noted.  Review of the MIP images confirms the above findings.  IMPRESSION: 1. No pulmonary embolus is noted. 2. No mediastinal  hematoma or adenopathy. 3. No acute infiltrate or pulmonary edema.   Electronically Signed   By: Lahoma Crocker M.D.   On: 02/03/2014 12:03     EKG Interpretation   Date/Time:  Sunday February 03 2014 04:54:17 EDT Ventricular Rate:  85 PR Interval:  158 QRS Duration: 88 QT Interval:  381 QTC Calculation: 453 R Axis:   43 Text Interpretation:  Sinus rhythm No significant change was found  Confirmed by Wyvonnia Dusky  MD, Markayla Reichart ([id number]) on 02/03/2014 7:45:58 AM      MDM   Final diagnoses:  Sickle cell crisis   Typical sickle cell pain in joints, back and chest. No fever, cough or shortness of breath. No hypoxia or tachycardia though her chest pain is somewhat pleuritic.  Chest x-ray negative. EKG unchanged Patient given IV fluids and IV narcotics. Low suspicion for acute chest syndrome. Hemoglobin is stable, reticulocyte count is adequate.  Pain control in the ED after 3 doses of IV medications. Patient is feeling much better and tolerating by mouth. She wishes to go home. She denies any chest pain. Her body aches and back pain are much better. She has her Vicodin at home. There is no evidence of acute chest syndrome. There is no fever, cough or  leukocytosis. Chest x-ray negative. CT negative for PE.  BP 102/53  Pulse 85  Temp(Src) 98.1 F (36.7 C) (Oral)  Resp 17  SpO2 98%  LMP 02/03/2014   Ezequiel Essex, MD 02/03/14 [phone]

## 2014-02-03 NOTE — ED Notes (Signed)
Pt complains of sickle cell pain that started in her back and woke her from her sleep

## 2014-02-03 NOTE — ED Notes (Signed)
Attempted to draw labs twice with no success. Notified phlebotomy to stick for labs.

## 2014-03-28 ENCOUNTER — Inpatient Hospital Stay (HOSPITAL_COMMUNITY)
Admission: AD | Admit: 2014-03-28 | Discharge: 2014-03-28 | Discharge status: Against Medical Advice | Payer: PRIVATE HEALTH INSURANCE | Source: Ambulatory Visit | Attending: Obstetrics & Gynecology | Admitting: Obstetrics & Gynecology

## 2014-03-28 ENCOUNTER — Encounter (HOSPITAL_COMMUNITY): Payer: Self-pay | Admitting: General Practice

## 2014-03-28 DIAGNOSIS — R109 Unspecified abdominal pain: Secondary | ICD-10-CM | POA: Insufficient documentation

## 2014-03-28 DIAGNOSIS — Z3202 Encounter for pregnancy test, result negative: Secondary | ICD-10-CM | POA: Insufficient documentation

## 2014-03-28 LAB — URINE MICROSCOPIC-ADD ON

## 2014-03-28 LAB — URINALYSIS, ROUTINE W REFLEX MICROSCOPIC
Bilirubin Urine: NEGATIVE
GLUCOSE, UA: NEGATIVE mg/dL
Ketones, ur: NEGATIVE mg/dL
LEUKOCYTES UA: NEGATIVE
NITRITE: NEGATIVE
PH: 6 (ref 5.0–8.0)
Protein, ur: NEGATIVE mg/dL
SPECIFIC GRAVITY, URINE: 1.015 (ref 1.005–1.030)
Urobilinogen, UA: 1 mg/dL (ref 0.0–1.0)

## 2014-03-28 NOTE — MAU Note (Signed)
Pt took home pregnancy test 2 days ago and it was positive.  Started having upper abd pain on Friday which is intermittent.  Pt started spotting yesterday light pinkish color.  No clots.  Denies vaginal abnormal discharge, itching or burning.

## 2014-03-28 NOTE — MAU Note (Signed)
Pt was seen in [facility]U with c/o abdominal pain and stated she had a positive pregnancy test at home. She states she did not trust these tests.  The pregnancy test in MAU was negative and patient was told that she would have a blood test drawn to confirm pregnancy. Pt left the unit before the blood was drawn.

## 2014-03-29 LAB — POCT PREGNANCY, URINE: Preg Test, Ur: NEGATIVE

## 2014-04-03 ENCOUNTER — Emergency Department (HOSPITAL_COMMUNITY): Payer: PRIVATE HEALTH INSURANCE

## 2014-04-03 ENCOUNTER — Encounter (HOSPITAL_COMMUNITY): Payer: Self-pay | Admitting: Emergency Medicine

## 2014-04-03 ENCOUNTER — Emergency Department (HOSPITAL_COMMUNITY)
Admission: EM | Admit: 2014-04-03 | Discharge: 2014-04-03 | Disposition: A | Discharge status: Home / Self Care | Payer: PRIVATE HEALTH INSURANCE | Attending: Emergency Medicine | Admitting: Emergency Medicine

## 2014-04-03 DIAGNOSIS — Z79899 Other long term (current) drug therapy: Secondary | ICD-10-CM | POA: Insufficient documentation

## 2014-04-03 DIAGNOSIS — Z8659 Personal history of other mental and behavioral disorders: Secondary | ICD-10-CM | POA: Insufficient documentation

## 2014-04-03 DIAGNOSIS — Z3202 Encounter for pregnancy test, result negative: Secondary | ICD-10-CM | POA: Diagnosis not present

## 2014-04-03 DIAGNOSIS — D5700 Hb-SS disease with crisis, unspecified: Secondary | ICD-10-CM | POA: Diagnosis not present

## 2014-04-03 LAB — COMPREHENSIVE METABOLIC PANEL
ALBUMIN: 3.9 g/dL (ref 3.5–5.2)
ALT: 23 U/L (ref 0–35)
AST: 27 U/L (ref 0–37)
Alkaline Phosphatase: 75 U/L (ref 39–117)
BUN: 13 mg/dL (ref 6–23)
CO2: 23 mEq/L (ref 19–32)
Calcium: 9.5 mg/dL (ref 8.4–10.5)
Chloride: 104 mEq/L (ref 96–112)
Creatinine, Ser: 0.6 mg/dL (ref 0.50–1.10)
GFR calc non Af Amer: >90 mL/min (ref >90)
GLUCOSE: 99 mg/dL (ref 70–99)
Potassium: 3.8 mEq/L (ref 3.7–5.3)
Sodium: 138 mEq/L (ref 137–147)
Total Bilirubin: 1.7 mg/dL — ABNORMAL HIGH (ref 0.3–1.2)
Total Protein: 8.1 g/dL (ref 6.0–8.3)

## 2014-04-03 LAB — CBC WITH DIFFERENTIAL/PLATELET
Basophils Absolute: 0.1 10*3/uL (ref 0.0–0.1)
Basophils Relative: 1 % (ref 0–1)
EOS ABS: 0.1 10*3/uL (ref 0.0–0.7)
EOS PCT: 1 % (ref 0–5)
HCT: 24.7 % — ABNORMAL LOW (ref 36.0–46.0)
Hemoglobin: 8.8 g/dL — ABNORMAL LOW (ref 12.0–15.0)
LYMPHS ABS: 4.6 10*3/uL — AB (ref 0.7–4.0)
Lymphocytes Relative: 36 % (ref 12–46)
MCH: 28.9 pg (ref 26.0–34.0)
MCHC: 35.6 g/dL (ref 30.0–36.0)
MCV: 81.3 fL (ref 78.0–100.0)
Monocytes Absolute: 1.4 10*3/uL — ABNORMAL HIGH (ref 0.1–1.0)
Monocytes Relative: 11 % (ref 3–12)
Neutro Abs: 6.6 10*3/uL (ref 1.7–7.7)
Neutrophils Relative %: 51 % (ref 43–77)
Platelets: 560 10*3/uL — ABNORMAL HIGH (ref 150–400)
RBC: 3.04 MIL/uL — AB (ref 3.87–5.11)
RDW: 16.1 % — AB (ref 11.5–15.5)
WBC: 12.7 10*3/uL — ABNORMAL HIGH (ref 4.0–10.5)

## 2014-04-03 LAB — URINE MICROSCOPIC-ADD ON

## 2014-04-03 LAB — URINALYSIS, ROUTINE W REFLEX MICROSCOPIC
Bilirubin Urine: NEGATIVE
Glucose, UA: NEGATIVE mg/dL
Ketones, ur: NEGATIVE mg/dL
NITRITE: NEGATIVE
PH: 5.5 (ref 5.0–8.0)
PROTEIN: NEGATIVE mg/dL
Specific Gravity, Urine: 1.015 (ref 1.005–1.030)
Urobilinogen, UA: 1 mg/dL (ref 0.0–1.0)

## 2014-04-03 LAB — RETICULOCYTES
RBC.: 3.04 MIL/uL — ABNORMAL LOW (ref 3.87–5.11)
RETIC COUNT ABSOLUTE: 313.1 10*3/uL — AB (ref 19.0–186.0)
RETIC CT PCT: 10.3 % — AB (ref 0.4–3.1)

## 2014-04-03 LAB — POC URINE PREG, ED: Preg Test, Ur: NEGATIVE

## 2014-04-03 MED ORDER — SODIUM CHLORIDE 0.9 % IV SOLN
1000.0000 mL | Freq: Once | INTRAVENOUS | Status: AC
  Administered 2014-04-03: 1000 mL via INTRAVENOUS

## 2014-04-03 MED ORDER — HYDROMORPHONE HCL PF 1 MG/ML IJ SOLN
1.0000 mg | Freq: Once | INTRAMUSCULAR | Status: DC
  Filled 2014-04-03: qty 1

## 2014-04-03 MED ORDER — ONDANSETRON HCL 4 MG/2ML IJ SOLN
4.0000 mg | Freq: Once | INTRAMUSCULAR | Status: AC
  Administered 2014-04-03: 4 mg via INTRAVENOUS
  Filled 2014-04-03: qty 2

## 2014-04-03 MED ORDER — SODIUM CHLORIDE 0.9 % IV SOLN
1000.0000 mL | INTRAVENOUS | Status: DC
  Administered 2014-04-03: 1000 mL via INTRAVENOUS

## 2014-04-03 MED ORDER — HYDROMORPHONE HCL PF 2 MG/ML IJ SOLN
2.0000 mg | Freq: Once | INTRAMUSCULAR | Status: AC
  Administered 2014-04-03: 2 mg via INTRAVENOUS
  Filled 2014-04-03: qty 1

## 2014-04-03 MED ORDER — ONDANSETRON 8 MG PO TBDP
8.0000 mg | ORAL_TABLET | Freq: Once | ORAL | Status: AC
Start: 2014-04-03 — End: 2014-04-03
  Administered 2014-04-03: 8 mg via ORAL
  Filled 2014-04-03: qty 1

## 2014-04-03 NOTE — ED Notes (Signed)
Dr. Glick at bedside.  

## 2014-04-03 NOTE — ED Provider Notes (Signed)
CSN: [id number]     Arrival date & time 04/03/14  0002 History   First MD Initiated Contact with Patient 04/03/14 [phone]     Chief Complaint  Patient presents with  . Sickle Cell Pain Crisis     (Consider location/radiation/quality/duration/timing/severity/associated sxs/prior Treatment) Patient is a 23 y.o. female presenting with sickle cell pain. The history is provided by the patient.  Sickle Cell Pain Crisis She had onset this morning of pain in her back and in her right arm and leg. This is typical of her sickle cell pain. Pain is severe and she rates it at 9/10. Nothing makes it better and nothing makes it worse. She tried taking hydrocodone acetaminophen at home with no relief. She denies any infectious symptoms. She specifically denies sore throat, cough, dysuria, and nausea, vomiting, diarrhea.  Past Medical History  Diagnosis Date  . Sickle cell crisis   . Depression   . Headache(784.0)   . Abortion     05/2012   Past Surgical History  Procedure Laterality Date  . Splenectomy    . Splenectomy, total     Family History  Problem Relation Age of Onset  . Hypertension Mother   . Sickle cell anemia Sister   . Kidney disease Sister   . Arthritis Sister   . Sickle cell anemia Sister   . Sickle cell trait Sister   . Heart disease Maternal Aunt   . Heart disease Maternal Uncle    History  Substance Use Topics  . Smoking status: Never Smoker   . Smokeless tobacco: Never Used  . Alcohol Use: No   OB History   Grav Para Term Preterm Abortions TAB SAB Ect Mult Living   1    1          Review of Systems  All other systems reviewed and are negative.     Allergies  Other  Home Medications   Prior to Admission medications   Medication Sig Start Date End Date Taking? Authorizing Provider  HYDROcodone-acetaminophen (NORCO) 10-325 MG per tablet Take 2 tablets by mouth 2 (two) times daily.   Yes Historical Provider, MD   BP 118/58  Pulse 84  Temp(Src) 98.8 F (37.1  C) (Oral)  Resp 14  SpO2 99%  LMP 02/03/2014 Physical Exam  Nursing note and vitals reviewed.  23 year old female, resting comfortably and in no acute distress. Vital signs are normal. Oxygen saturation is 99%, which is normal. Head is normocephalic and atraumatic. PERRLA, EOMI. Oropharynx is clear. Mild scleral icterus is present. Neck is nontender and supple without adenopathy or JVD. Back is nontender and there is no CVA tenderness. Lungs are clear without rales, wheezes, or rhonchi. Chest is nontender. Heart has regular rate and rhythm without murmur. Abdomen is soft, flat, nontender without masses or hepatosplenomegaly and peristalsis is normoactive. Extremities have no cyanosis or edema, full range of motion is present. Skin is warm and dry without rash. Neurologic: Mental status is normal, cranial nerves are intact, there are no motor or sensory deficits.  ED Course  Procedures (including critical care time) Labs Review Results for orders placed during the hospital encounter of 04/03/14  CBC WITH DIFFERENTIAL      Result Value Ref Range   WBC 12.7 (*) 4.0 - 10.5 K/uL   RBC 3.04 (*) 3.87 - 5.11 MIL/uL   Hemoglobin 8.8 (*) 12.0 - 15.0 g/dL   HCT 24.7 (*) 36.0 - 46.0 %   MCV 81.3  78.0 - 100.0  fL   MCH 28.9  26.0 - 34.0 pg   MCHC 35.6  30.0 - 36.0 g/dL   RDW 16.1 (*) 11.5 - 15.5 %   Platelets 560 (*) 150 - 400 K/uL   Neutrophils Relative % 51  43 - 77 %   Neutro Abs 6.6  1.7 - 7.7 K/uL   Lymphocytes Relative 36  12 - 46 %   Lymphs Abs 4.6 (*) 0.7 - 4.0 K/uL   Monocytes Relative 11  3 - 12 %   Monocytes Absolute 1.4 (*) 0.1 - 1.0 K/uL   Eosinophils Relative 1  0 - 5 %   Eosinophils Absolute 0.1  0.0 - 0.7 K/uL   Basophils Relative 1  0 - 1 %   Basophils Absolute 0.1  0.0 - 0.1 K/uL  COMPREHENSIVE METABOLIC PANEL      Result Value Ref Range   Sodium 138  137 - 147 mEq/L   Potassium 3.8  3.7 - 5.3 mEq/L   Chloride 104  96 - 112 mEq/L   CO2 23  19 - 32 mEq/L    Glucose, Bld 99  70 - 99 mg/dL   BUN 13  6 - 23 mg/dL   Creatinine, Ser 0.60  0.50 - 1.10 mg/dL   Calcium 9.5  8.4 - 10.5 mg/dL   Total Protein 8.1  6.0 - 8.3 g/dL   Albumin 3.9  3.5 - 5.2 g/dL   AST 27  0 - 37 U/L   ALT 23  0 - 35 U/L   Alkaline Phosphatase 75  39 - 117 U/L   Total Bilirubin 1.7 (*) 0.3 - 1.2 mg/dL   GFR calc non Af Amer >90  >90 mL/min   GFR calc Af Amer >90  >90 mL/min  RETICULOCYTES      Result Value Ref Range   Retic Ct Pct 10.3 (*) 0.4 - 3.1 %   RBC. 3.04 (*) 3.87 - 5.11 MIL/uL   Retic Count, Manual 313.1 (*) 19.0 - 186.0 K/uL  URINALYSIS, ROUTINE W REFLEX MICROSCOPIC      Result Value Ref Range   Color, Urine YELLOW  YELLOW   APPearance CLOUDY (*) CLEAR   Specific Gravity, Urine 1.015  1.005 - 1.030   pH 5.5  5.0 - 8.0   Glucose, UA NEGATIVE  NEGATIVE mg/dL   Hgb urine dipstick LARGE (*) NEGATIVE   Bilirubin Urine NEGATIVE  NEGATIVE   Ketones, ur NEGATIVE  NEGATIVE mg/dL   Protein, ur NEGATIVE  NEGATIVE mg/dL   Urobilinogen, UA 1.0  0.0 - 1.0 mg/dL   Nitrite NEGATIVE  NEGATIVE   Leukocytes, UA MODERATE (*) NEGATIVE  URINE MICROSCOPIC-ADD ON      Result Value Ref Range   Squamous Epithelial / LPF FEW (*) RARE   WBC, UA 3-6  <3 WBC/hpf   RBC / HPF 0-2  <3 RBC/hpf   Bacteria, UA RARE  RARE  POC URINE PREG, ED      Result Value Ref Range   Preg Test, Ur NEGATIVE  NEGATIVE  Imaging Review Dg Chest 2 View  04/03/2014   CLINICAL DATA:  Sickle cell pain crisis  EXAM: CHEST  2 VIEW  COMPARISON:  02/03/2014  FINDINGS: Normal heart size and mediastinal contours. No acute infiltrate or edema. No effusion or pneumothorax. No acute osseous findings.  IMPRESSION: No active cardiopulmonary disease.   Electronically Signed   By: Jorje Guild M.D.   On: 04/03/2014 04:11    MDM   Final  diagnoses:  None    Sickle cell vaso-occlusive crisis. Old records are reviewed and she has numerous ED visits for sickle cell with occasional hospitalizations. Of note, she  had been seen at [facility] hospital a few days ago with complaint of a possible pregnancy. She had a negative urine pregnancy but did not stay for serum pregnancy. Urine pregnancy test will be repeated. She's given IV fluids and IV hydromorphone and ondansetron.  4:58 AM She states that she is feeling slightly better, but pain is still rated at 9/10. She will be given a second injection of hydromorphone.  5:51 AM She states that her pain is significantly improved and she feels she can go home and manage her pain at home. She is discharged.  Delora Fuel, MD [id number]

## 2014-04-03 NOTE — ED Notes (Signed)
Pt states that she has been having pain all day in her back and on her R arm and R leg. Taking vicodin at home w/o relief. Feels like sickle cell crisis.

## 2014-04-03 NOTE — Discharge Instructions (Signed)
Sickle Cell Anemia, Adult °Sickle cell anemia is a condition in which red blood cells have an abnormal "sickle" shape. This abnormal shape shortens the cells' life span, which results in a lower than normal concentration of red blood cells in the blood. The sickle shape also causes the cells to clump together and block free blood flow through the blood vessels. As a result, the tissues and organs of the body do not receive enough oxygen. Sickle cell anemia causes organ damage and pain and increases the risk of infection. °CAUSES  °Sickle cell anemia is a genetic disorder. Those who receive two copies of the gene have the condition, and those who receive one copy have the trait. °RISK FACTORS °The sickle cell gene is most common in people whose families originated in [place]. Other areas of the globe where sickle cell trait occurs include the [place], [place] and [place], the [place], and the [place].  °SIGNS AND SYMPTOMS °· Pain, especially in the extremities, back, chest, or abdomen (common). The pain may start suddenly or may develop following an illness, especially if there is dehydration. Pain can also occur due to overexertion or exposure to extreme temperature changes. °· Frequent severe bacterial infections, especially certain types of pneumonia and meningitis. °· Pain and swelling in the hands and feet. °· Decreased activity.   °· Loss of appetite.   °· Change in behavior. °· Headaches. °· Seizures. °· Shortness of breath or difficulty breathing. °· Vision changes. °· Skin ulcers. °Those with the trait may not have symptoms or they may have mild symptoms.  °DIAGNOSIS  °Sickle cell anemia is diagnosed with blood tests that demonstrate the genetic trait. It is often diagnosed during the newborn period, due to mandatory testing nationwide. A variety of blood tests, X-rays, CT scans, MRI scans, ultrasounds, and lung function tests may also be done to monitor the condition. °TREATMENT  °Sickle  cell anemia may be treated with: °· Medicines. You may be given pain medicines, antibiotic medicines (to treat and prevent infections) or medicines to increase the production of certain types of hemoglobin. °· Fluids. °· Oxygen. °· Blood transfusions. °HOME CARE INSTRUCTIONS  °· Drink enough fluid to keep your urine clear or pale yellow. Increase your fluid intake in hot weather and during exercise. °· Do not smoke. Smoking lowers oxygen levels in the blood.   °· Only take over-the-counter or prescription medicines for pain, fever, or discomfort as directed by your health care provider. °· Take antibiotics as directed by your health care provider. Make sure you finish them it even if you start to feel better.   °· Take supplements as directed by your health care provider.   °· Consider wearing a medical alert bracelet. This tells anyone caring for you in an emergency of your condition.   °· When traveling, keep your medical information, health care provider's names, and the medicines you take with you at all times.   °· If you develop a fever, do not take medicines to reduce the fever right away. This could cover up a problem that is developing. Notify your health care provider. °· Keep all follow-up appointments with your health care provider. Sickle cell anemia requires regular medical care. °SEEK MEDICAL CARE IF: ° You have a fever. °SEEK IMMEDIATE MEDICAL CARE IF:  °· You feel dizzy or faint.   °· You have new abdominal pain, especially on the left side near the stomach area.   °· You develop a persistent, often uncomfortable and painful penile erection (priapism). If this is not treated immediately it   will lead to impotence.   °· You have numbness your arms or legs or you have a hard time moving them.   °· You have a hard time with speech.   °· You have a fever or persistent symptoms for more than 2 3 days.   °· You have a fever and your symptoms suddenly get worse.   °· You have signs or symptoms of infection.  These include:   °· Chills.   °· Abnormal tiredness (lethargy).   °· Irritability.   °· Poor eating.   °· Vomiting.   °· You develop pain that is not helped with medicine.   °· You develop shortness of breath. °· You have pain in your chest.   °· You are coughing up pus-like or bloody sputum.   °· You develop a stiff neck. °· Your feet or hands swell or have pain. °· Your abdomen appears bloated. °· You develop joint pain. °MAKE SURE YOU: °· Understand these instructions. °· Will watch your child's condition. °· Will get help right away if your child is not doing well or gets worse. °Document Released: 01/26/2006 Document Revised: 08/08/2013 Document Reviewed: 05/30/2013 °[employer]® Patient Information ©2014 [employer]. ° °

## 2014-04-21 ENCOUNTER — Emergency Department (HOSPITAL_COMMUNITY)
Admission: EM | Admit: 2014-04-21 | Discharge: 2014-04-21 | Disposition: A | Discharge status: Home / Self Care | Payer: Medicaid Other | Attending: Emergency Medicine | Admitting: Emergency Medicine

## 2014-04-21 ENCOUNTER — Encounter (HOSPITAL_COMMUNITY): Payer: Self-pay | Admitting: Emergency Medicine

## 2014-04-21 DIAGNOSIS — N751 Abscess of Bartholin's gland: Secondary | ICD-10-CM | POA: Diagnosis present

## 2014-04-21 DIAGNOSIS — N760 Acute vaginitis: Secondary | ICD-10-CM | POA: Insufficient documentation

## 2014-04-21 DIAGNOSIS — R011 Cardiac murmur, unspecified: Secondary | ICD-10-CM | POA: Insufficient documentation

## 2014-04-21 DIAGNOSIS — F329 Major depressive disorder, single episode, unspecified: Secondary | ICD-10-CM | POA: Diagnosis not present

## 2014-04-21 DIAGNOSIS — F3289 Other specified depressive episodes: Secondary | ICD-10-CM | POA: Diagnosis not present

## 2014-04-21 DIAGNOSIS — N750 Cyst of Bartholin's gland: Secondary | ICD-10-CM | POA: Diagnosis not present

## 2014-04-21 DIAGNOSIS — D5700 Hb-SS disease with crisis, unspecified: Secondary | ICD-10-CM | POA: Insufficient documentation

## 2014-04-21 DIAGNOSIS — B9689 Other specified bacterial agents as the cause of diseases classified elsewhere: Secondary | ICD-10-CM

## 2014-04-21 LAB — URINALYSIS, ROUTINE W REFLEX MICROSCOPIC
Bilirubin Urine: NEGATIVE
GLUCOSE, UA: NEGATIVE mg/dL
Hgb urine dipstick: NEGATIVE
Ketones, ur: NEGATIVE mg/dL
Nitrite: NEGATIVE
PH: 6.5 (ref 5.0–8.0)
Protein, ur: NEGATIVE mg/dL
Specific Gravity, Urine: 1.009 (ref 1.005–1.030)
Urobilinogen, UA: 1 mg/dL (ref 0.0–1.0)

## 2014-04-21 LAB — WET PREP, GENITAL
Trich, Wet Prep: NONE SEEN
YEAST WET PREP: NONE SEEN

## 2014-04-21 LAB — URINE MICROSCOPIC-ADD ON

## 2014-04-21 LAB — PREGNANCY, URINE: Preg Test, Ur: NEGATIVE

## 2014-04-21 MED ORDER — LIDOCAINE HCL (PF) 1 % IJ SOLN
5.0000 mL | Freq: Once | INTRAMUSCULAR | Status: AC
  Administered 2014-04-21: 5 mL via INTRADERMAL
  Filled 2014-04-21: qty 5

## 2014-04-21 MED ORDER — METRONIDAZOLE 500 MG PO TABS: 500.0000 mg | ORAL_TABLET | Freq: Two times a day (BID) | ORAL | Status: DC

## 2014-04-21 NOTE — ED Provider Notes (Signed)
CSN: [id number]     Arrival date & time 04/21/14  1723 History   First MD Initiated Contact with Patient 04/21/14 1759     Chief Complaint  Patient presents with  . Vaginal Discharge  . Abscess   HPI  Carmen [name] is a 23 y.o. female with a PMH of sickle cell crisis, depression, headache and abortion who presents to the ED for evaluation of vaginal discharge and an abscess. History was provided by the patient. Patient states she has had a "lump" on her labia for the past 1.5 weeks. She states she shaved and now has a lump to her external genitalia. She has had this before and was told it was an ingrown hair. No drainage. Patient also complains of a white vaginal discharge for the past 2-3 days. She denies any vaginal bleeding, vaginal pain, abdominal pain, pelvic pain, diarrhea, constipation, vomiting, dysuria, or other concerns. She is currently sexually active with no new partners.    Past Medical History  Diagnosis Date  . Sickle cell crisis   . Depression   . Headache(784.0)   . Abortion     05/2012   Past Surgical History  Procedure Laterality Date  . Splenectomy    . Splenectomy, total     Family History  Problem Relation Age of Onset  . Hypertension Mother   . Sickle cell anemia Sister   . Kidney disease Sister   . Arthritis Sister   . Sickle cell anemia Sister   . Sickle cell trait Sister   . Heart disease Maternal Aunt   . Heart disease Maternal Uncle    History  Substance Use Topics  . Smoking status: Never Smoker   . Smokeless tobacco: Never Used  . Alcohol Use: No   OB History   Grav Para Term Preterm Abortions TAB SAB Ect Mult Living   1    1          Review of Systems  Constitutional: Negative for fever, chills, activity change, appetite change and fatigue.  Gastrointestinal: Negative for nausea, vomiting and abdominal pain.  Genitourinary: Positive for vaginal discharge and genital sores. Negative for dysuria, urgency, frequency, hematuria, decreased  urine volume, vaginal bleeding, difficulty urinating, vaginal pain, menstrual problem and pelvic pain.  Neurological: Negative for weakness.    Allergies  Other  Home Medications   Prior to Admission medications   Medication Sig Start Date End Date Taking? Authorizing Provider  HYDROcodone-acetaminophen (NORCO) 10-325 MG per tablet Take 2 tablets by mouth 2 (two) times daily.    Historical Provider, MD   BP 127/69  Pulse 110  Temp(Src) 99.4 F (37.4 C) (Oral)  Resp 23  SpO2 99%  LMP 04/03/2014  Filed Vitals:   04/21/14 1728 04/21/14 1937  BP: 127/69 117/68  Pulse: 110 97  Temp: 99.4 F (37.4 C)   TempSrc: Oral   Resp: 23 16  SpO2: 99% 98%    Physical Exam  Nursing note and vitals reviewed. Constitutional: She is oriented to person, place, and time. She appears well-developed and well-nourished. No distress.  HENT:  Head: Normocephalic and atraumatic.  Right Ear: External ear normal.  Left Ear: External ear normal.  Mouth/Throat: Oropharynx is clear and moist.  Eyes: Conjunctivae are normal. Right eye exhibits no discharge. Left eye exhibits no discharge.  Neck: Neck supple.  Cardiovascular: Normal rate and regular rhythm.  Exam reveals no gallop and no friction rub.   Murmur heard. Grade 1 holosystolic murmur  Pulmonary/Chest: Effort  normal and breath sounds normal. No respiratory distress. She has no wheezes. She has no rales. She exhibits no tenderness.  Abdominal: Soft. She exhibits no distension and no mass. There is no tenderness. There is no rebound and no guarding.  Genitourinary:     Mild-moderate amount of thin white discharge present in the vaginal vault. No vaginal bleeding. No CMT or adnexal tenderness bilaterally. Small 2 cm x 2 cm palpable non-tender bartholin's cyst/abscess to the right labia.   Musculoskeletal: Normal range of motion. She exhibits no edema and no tenderness.  No CVA, lumbar, or flank tenderness bilaterally.   Neurological: She is  alert and oriented to person, place, and time.  Skin: Skin is warm and dry. She is not diaphoretic.    ED Course  INCISION AND DRAINAGE Date/Time: 04/21/2014 7:30 PM Performed by: Vernie Murders K Authorized by: Lucila Maine Consent: Verbal consent obtained. Consent given by: patient Patient identity confirmed: verbally with patient Type: cyst Body area: anogenital Location details: Bartholin's gland Anesthesia: local infiltration Local anesthetic: lidocaine 1% without epinephrine Anesthetic total: 3 ml Patient sedated: no Scalpel size: 11 Incision type: single straight Complexity: simple Drainage: bloody and  serosanguinous Drainage amount: scant Wound treatment: wound left open Packing material: none Patient tolerance: Patient tolerated the procedure well with no immediate complications.   (including critical care time) Labs Review Labs Reviewed  WET PREP, GENITAL  GC/CHLAMYDIA PROBE AMP  PREGNANCY, URINE  URINALYSIS, ROUTINE W REFLEX MICROSCOPIC    Imaging Review No results found.   EKG Interpretation None      Results for orders placed during the hospital encounter of 04/21/14  WET PREP, GENITAL      Result Value Ref Range   Yeast Wet Prep HPF POC NONE SEEN  NONE SEEN   Trich, Wet Prep NONE SEEN  NONE SEEN   Clue Cells Wet Prep HPF POC MANY (*) NONE SEEN   WBC, Wet Prep HPF POC FEW (*) NONE SEEN  GC/CHLAMYDIA PROBE AMP      Result Value Ref Range   CT Probe RNA NEGATIVE  NEGATIVE   GC Probe RNA NEGATIVE  NEGATIVE  PREGNANCY, URINE      Result Value Ref Range   Preg Test, Ur NEGATIVE  NEGATIVE  URINALYSIS, ROUTINE W REFLEX MICROSCOPIC      Result Value Ref Range   Color, Urine YELLOW  YELLOW   APPearance CLEAR  CLEAR   Specific Gravity, Urine 1.009  1.005 - 1.030   pH 6.5  5.0 - 8.0   Glucose, UA NEGATIVE  NEGATIVE mg/dL   Hgb urine dipstick NEGATIVE  NEGATIVE   Bilirubin Urine NEGATIVE  NEGATIVE   Ketones, ur NEGATIVE  NEGATIVE mg/dL    Protein, ur NEGATIVE  NEGATIVE mg/dL   Urobilinogen, UA 1.0  0.0 - 1.0 mg/dL   Nitrite NEGATIVE  NEGATIVE   Leukocytes, UA MODERATE (*) NEGATIVE  URINE MICROSCOPIC-ADD ON      Result Value Ref Range   Squamous Epithelial / LPF RARE  RARE   WBC, UA 0-2  <3 WBC/hpf     MDM   Carmen [name] is a 23 y.o. female with a PMH of sickle cell crisis, depression, headache and abortion who presents to the ED for evaluation of vaginal discharge and an abscess. Etiology of vaginal discharge likely due to BV. Pelvic and abdominal exam benign. Patient also found to have a bartholin's cyst. Dot not feel this is an abscess at this time. Did drain cyst  with mild non-purulent drainage. Attempted to insert word catheter, however, this was unsuccessful. Vital signs stable. Instructed patient to follow-up with OB/GYN. Return precautions, discharge instructions, and follow-up was discussed with the patient before discharge.      Discharge Medication List as of 04/21/2014  8:01 PM    START taking these medications   Details  metroNIDAZOLE (FLAGYL) 500 MG tablet Take 1 tablet (500 mg total) by mouth 2 (two) times daily., Starting 04/21/2014, Until Discontinued, Print         Final impressions: 1. Bacterial vaginosis   2. Bartholin's cyst      Harold Hedge Palmer PA-C            Lucila Maine, PA-C 04/22/14 2007

## 2014-04-21 NOTE — ED Notes (Signed)
Pt presents with c/o bump on her vaginal area and some vaginal discharge. Pt says that she noticed the bump on her vagina after she shaved, seen for same in the past that turned out to be an ingrown hair. Pt also c/o vaginal discharge, no odor.

## 2014-04-21 NOTE — Discharge Instructions (Signed)
Continue warm sitz baths - see below  Keep area clean and dry - avoid shaving in the area of the cyst  Return to the emergency department if you develop any changing/worsening condition, increased drainage/swelling/pain, fever, or any other concerns (please read additional information regarding your condition below)   Bartholin's Cyst or Abscess Bartholin's glands are small glands located within the folds of skin (labia) along the sides of the lower opening of the vagina (birth canal). A cyst may develop when the duct of the gland becomes blocked. When this happens, fluid that accumulates within the cyst can become infected. This is known as an abscess. The Bartholin gland produces a mucous fluid to lubricate the outside of the vagina during sexual intercourse. SYMPTOMS   Patients with a small cyst may not have any symptoms.  Mild discomfort to severe pain depending on the size of the cyst and if it is infected (abscess).  Pain, redness, and swelling around the lower opening of the vagina.  Painful intercourse.  Pressure in the perineal area.  Swelling of the lips of the vagina (labia).  The cyst or abscess can be on one side or both sides of the vagina. DIAGNOSIS   A large swelling is seen in the lower vagina area by your caregiver.  Painful to touch.  Redness and pain, if it is an abscess. TREATMENT   Sometimes the cyst will go away on its own.  Apply warm wet compresses to the area or take hot sitz baths several times a day.  An incision to drain the cyst or abscess with local anesthesia.  Culture the pus, if it is an abscess.  Antibiotic treatment, if it is an abscess.  Cut open the gland and suture the edges to make the opening of the gland bigger (marsupialization).  Remove the whole gland if the cyst or abscess returns. PREVENTION   Practice good hygiene.  Clean the vaginal area with a mild soap and soft cloth when bathing.  Do not rub hard in the vaginal area  when bathing.  Protect the crotch area with a padded cushion if you take long bike rides or ride horses.  Be sure you are well lubricated when you have sexual intercourse. HOME CARE INSTRUCTIONS   If your cyst or abscess was opened, a small piece of gauze, or a drain, may have been placed in the wound to allow drainage. Do not remove this gauze or drain unless directed by your caregiver.  Wear feminine pads, not tampons, as needed for any drainage or bleeding.  If antibiotics were prescribed, take them exactly as directed. Finish the entire course.  Only take over-the-counter or prescription medicines for pain, discomfort, or fever as directed by your caregiver. SEEK IMMEDIATE MEDICAL CARE IF:   You have an increase in pain, redness, swelling, or drainage.  You have bleeding from the wound which results in the use of more than the number of pads suggested by your caregiver in 24 hours.  You have chills.  You have a fever.  You develop any new problems (symptoms) or aggravation of your existing condition. MAKE SURE YOU:   Understand these instructions.  Will watch your condition.  Will get help right away if you are not doing well or get worse. Document Released: 10/18/2005 Document Revised: 01/10/2012 Document Reviewed: 06/05/2008 [facility] Patient Information 2015 [place], [place]. This information is not intended to replace advice given to you by your health care provider. Make sure you discuss any questions you have  with your health care provider.  Bacterial Vaginosis Bacterial vaginosis is a vaginal infection that occurs when the normal balance of bacteria in the vagina is disrupted. It results from an overgrowth of certain bacteria. This is the most common vaginal infection in women of childbearing age. Treatment is important to prevent complications, especially in pregnant women, as it can cause a premature delivery. CAUSES  Bacterial vaginosis is caused by an increase in  harmful bacteria that are normally present in smaller amounts in the vagina. Several different kinds of bacteria can cause bacterial vaginosis. However, the reason that the condition develops is not fully understood. RISK FACTORS Certain activities or behaviors can put you at an increased risk of developing bacterial vaginosis, including:  Having a new sex partner or multiple sex partners.  Douching.  Using an intrauterine device (IUD) for contraception. Women do not get bacterial vaginosis from toilet seats, bedding, swimming pools, or contact with objects around them. SIGNS AND SYMPTOMS  Some women with bacterial vaginosis have no signs or symptoms. Common symptoms include:  Grey vaginal discharge.  A fishlike odor with discharge, especially after sexual intercourse.  Itching or burning of the vagina and vulva.  Burning or pain with urination. DIAGNOSIS  Your health care provider will take a medical history and examine the vagina for signs of bacterial vaginosis. A sample of vaginal fluid may be taken. Your health care provider will look at this sample under a microscope to check for bacteria and abnormal cells. A vaginal pH test may also be done.  TREATMENT  Bacterial vaginosis may be treated with antibiotic medicines. These may be given in the form of a pill or a vaginal cream. A second round of antibiotics may be prescribed if the condition comes back after treatment.  HOME CARE INSTRUCTIONS   Only take over-the-counter or prescription medicines as directed by your health care provider.  If antibiotic medicine was prescribed, take it as directed. Make sure you finish it even if you start to feel better.  Do not have sex until treatment is completed.  Tell all sexual partners that you have a vaginal infection. They should see their health care provider and be treated if they have problems, such as a mild rash or itching.  Practice safe sex by using condoms and only having one  sex partner. SEEK MEDICAL CARE IF:   Your symptoms are not improving after 3 days of treatment.  You have increased discharge or pain.  You have a fever. MAKE SURE YOU:   Understand these instructions.  Will watch your condition.  Will get help right away if you are not doing well or get worse. FOR MORE INFORMATION  Centers for Disease Control and Prevention, Division of STD Prevention: AppraiserFraud.fi American Sexual Health Association (ASHA): www.ashastd.org  Document Released: 10/18/2005 Document Revised: 08/08/2013 Document Reviewed: 05/30/2013 [facility] Patient Information 2015 [place], [place]. This information is not intended to replace advice given to you by your health care provider. Make sure you discuss any questions you have with your health care provider.   Emergency Department Resource Guide 1) Find a Doctor and Pay Out of Pocket Although you won't have to find out who is covered by your insurance plan, it is a good idea to ask around and get recommendations. You will then need to call the office and see if the doctor you have chosen will accept you as a new patient and what types of options they offer for patients who are self-pay. Some doctors offer  discounts or will set up payment plans for their patients who do not have insurance, but you will need to ask so you aren't surprised when you get to your appointment.  2) Contact Your Local Health Department Not all health departments have doctors that can see patients for sick visits, but many do, so it is worth a call to see if yours does. If you don't know where your local health department is, you can check in your phone book. The CDC also has a tool to help you locate your state's health department, and many state websites also have listings of all of their local health departments.  3) Find a [facility] If your illness is not likely to be very severe or complicated, you may want to try a walk in clinic. These are  popping up all over the country in pharmacies, drugstores, and shopping centers. They're usually staffed by nurse practitioners or physician assistants that have been trained to treat common illnesses and complaints. They're usually fairly quick and inexpensive. However, if you have serious medical issues or chronic medical problems, these are probably not your best option.  No Primary Care Doctor: - Call Health Connect at  [phone] - they can help you locate a primary care doctor that  accepts your insurance, provides certain services, etc. - Physician Referral Service- [phone]  Chronic Pain Problems: Organization         Address  Phone   Notes  [facility]  [phone] Patients need to be referred by their primary care doctor.   Medication Assistance: Organization         Address  Phone   Notes  [facility] Medication [facility] [address] [place], [place] [address] [phone] --Must be a resident of [facility] -- Must have NO insurance coverage whatsoever (no Medicaid/ Medicare, etc.) -- The pt. MUST have a primary care doctor that directs their care regularly and follows them in the community   MedAssist  [phone]   [employer]  [phone]    Agencies that provide inexpensive medical care: Organization         Address  Phone   Notes  [place]  [phone]   [facility]    [phone]   [facility] [place], [place] [address] [phone]   [place] [address], [place] [phone]   Planned Parenthood    [phone]   [facility]    [phone]   [place] and [place] [address], [place] Phone:  [phone], Fax:  [phone] Hours of Operation:  9 am - 6 pm, M-F.  Also accepts Medicaid/Medicare and self-pay.  [facility] for [place] [address], [address], [place] Phone: [phone], Fax: [phone]. Hours of Operation:  8:30 am - 5:30 pm, M-F.  Also accepts Medicaid and self-pay.  [facility] [place] [address] [place] Phone: [phone]   [place], [address] [place], [place] [phone], Ext. [phone] Mondays & Thursdays: 7-9 AM.  First 15 patients are seen on a first come, first serve basis.    [place] Providers:  Organization         Address  Phone   Notes  [facility] [address],  [place] [phone] Also accepts self-pay patients.  [facility] [address], Fernando [place]inas  [phone]   [place], [address], [place] [phone]   [facility] [address] [place] [phone]   Lucianne Lei [address], [place]   [phone] Only accepts [place] Access [place] patients after they have their name applied to their card.   Self-Pay (no insurance) in [facility]:  Organization         Address  Phone   Notes  Sickle Cell Patients, [facility] [place] [phone]   [facility] [place] [phone]   [facility] [place]  [place], [place], [place] [phone]   [facility]/Dr. Osei-Bonsu  [address], [place] or [address], [place] [phone] Phone number for both [place] and [place] locations is the same.  [facility]gent [facility] [address] [place] [phone]   [facility] [address] [place] or [address] [phone]   [facility] [address] [place] [phone], phone; [phone], fax Sees patients 1st and 3rd Saturday  of every month.  Must not qualify for public or private insurance (i.e. Medicaid, Medicare, [place] Health Choice, Veterans' Benefits)  Household income should be no more than 200% of the poverty level The clinic cannot treat you if you are pregnant or think you are pregnant  Sexually transmitted diseases are not treated at the clinic.    Dental Care: Organization         Address  Phone  Notes  [facility] Department of [facility] [place] [phone] Accepts children up to age 74 who are enrolled in [place] or [place]; pregnant women with a Medicaid card; and children who have applied for Medicaid or [place] Health Choice, but were declined, whose parents can pay a reduced fee at time of service.  [facility] Department of [facility]  [address], [place] [phone] Accepts children up to age 1 who are enrolled in [place] or [place]; pregnant women with a Medicaid card; and children who have applied for Medicaid or  Health Choice, but were declined, whose parents can pay a reduced fee at time of service.  [place] Adult [facility] Access PROGRAM  [place] [phone] Patients are seen by appointment only. Walk-ins are not accepted. [place] will see patients 73 years of age and older. Monday - Tuesday (8am-5pm) Most Wednesdays (8:30-5pm) $30 per visit, cash only  [facility] Adult Dental Access PROGRAM  [address], [facility] [phone] Patients are seen by appointment only. Walk-ins are not accepted. [place] will see patients 55 years of age and older. One Wednesday Evening (Monthly: Volunteer Based).  $30 per visit, cash only  [place]  [phone] for adults; Children under age 58, call Graduate Pediatric Dentistry at [phone]. Children aged 61-14, please call [phone] to request a pediatric application.   Dental services are provided in all areas of dental care including fillings, crowns and bridges, complete and partial dentures, implants, gum treatment, root canals, and extractions. Preventive care is  also provided. Treatment is provided to both adults and children. Patients are selected via a lottery and there is often a waiting list.   [facility] [address] [place]  [phone] [email or web address]   [facility] [address] [place], [place] [phone], Ext. [phone] Second and Fourth Thursday of each month, opens at 6:30 AM; Clinic ends at 9 AM.  Patients are seen on a first-come first-served basis, and a limited number are seen during each clinic.   [facility]  [address] Hillard Danker [place], [place] [phone]   Eligibility Requirements You must have lived in [place], [place], or [place] counties for at least the last three months.   You cannot be eligible for state or federal sponsored [employer], including [employer], [place], or [employer].   You generally cannot be eligible for healthcare insurance through your employer.    How to apply: Eligibility screenings are held every Tuesday and Wednesday afternoon from 1:00 pm until 4:00 pm. You do not need an appointment for the interview!  [facility] [address] [place], [place]   [place]  [place] Department  [place]  [phone]    Behavioral Health Resources in the Community: Intensive Outpatient Programs Organization         Address  Phone  Notes  [place]. [address] [place], [place] [phone]   [facility] Outpatient [address] [place], [place]   ADS: Alcohol & Drug Svcs [address] [place], [place]   [place] [address],  [place], [place] or [phone]   Substance Abuse Resources Organization         Address  Phone  Notes  Alcohol and Drug Services  [phone]   Central  [phone]   [facility] [place]   Chinita Pester  [phone]   Residential & Outpatient Substance Abuse Program  [phone]   Psychological Services Organization         Address  Phone  Notes  [facility] [place]  [place]  [phone]   [place] [address], [place] or [phone]    Mobile Crisis Teams Organization         Address  Phone  Notes  [facility], Mobile Crisis Care Unit  [phone]   [facility]  [address] [place], [place]   [place] [address] [place] [phone]    Self-Help/Support Groups Organization         Address  Phone             Notes  [place]. of [place] - variety of support groups  [place] Call for more information  Narcotics Anonymous (NA), [facility] [address], [employer] [place]  2 meetings at this location   Special educational needs teacher         Address  Phone  Notes  [facility]AP Residential Treatment [place],    [place]  [phone]   [facility]  [address], [place] [phone], [place], [place]   [place], [place] [phone] Admissions: 8am-3pm M-F  Incentives Substance [place] [address]  [place], [place]   [facility] [address] Jadene Pierini [place], [place]   The [place].,  [place], [place]   [facility] - Intensive Outpatient [address] Kristeen Mans [address], [place], [place]   [facility] ([place] [address]  [place], [place]  [phone] or [phone]   [facility]idential Treatment Services (RTS) [address], [place], [place] Accepts Medicaid  [facility] [address]  [place] [phone] Substance Abuse/Addiction Treatment   [facility] Organization         Address  Phone  Notes  [facility]  [phone]   Domenic Schwab, PhD [address] Arlis Porta [place], [place]   [phone] or [phone]   [place], [place] [phone]   [facility] [address], [place], [place] [phone] Insurance/Medicaid/sponsorship through [facility] and Families [address], [address] [place]                                    [place], [place] [phone] [place] [address] [place] [address], [place] [phone]    Dr. Adele Schilder  [phone]   [facility] Dept. 1) [address] [place] 2) [place] 3)  [place] [address], [place] [phone]  [phone]   [place] [phone] or [phone] (After Hours)

## 2014-04-21 NOTE — ED Notes (Signed)
Bed: WA03 Expected date:  Expected time:  Means of arrival:  Comments: Triage 1 

## 2014-04-22 LAB — GC/CHLAMYDIA PROBE AMP
CT Probe RNA: NEGATIVE
GC Probe RNA: NEGATIVE

## 2014-04-23 NOTE — ED Provider Notes (Signed)
Medical screening examination/treatment/procedure(s) were performed by non-physician practitioner and as supervising physician I was immediately available for consultation/collaboration.   EKG Interpretation None        Osvaldo Shipper, MD 04/23/14 [phone]

## 2014-05-10 ENCOUNTER — Emergency Department (HOSPITAL_COMMUNITY)
Admission: EM | Admit: 2014-05-10 | Discharge: 2014-05-10 | Disposition: A | Discharge status: Home / Self Care | Payer: PRIVATE HEALTH INSURANCE | Attending: Emergency Medicine | Admitting: Emergency Medicine

## 2014-05-10 ENCOUNTER — Encounter (HOSPITAL_COMMUNITY): Payer: Self-pay | Admitting: Emergency Medicine

## 2014-05-10 ENCOUNTER — Other Ambulatory Visit: Payer: Self-pay

## 2014-05-10 DIAGNOSIS — Z8659 Personal history of other mental and behavioral disorders: Secondary | ICD-10-CM | POA: Insufficient documentation

## 2014-05-10 DIAGNOSIS — D5700 Hb-SS disease with crisis, unspecified: Secondary | ICD-10-CM | POA: Insufficient documentation

## 2014-05-10 DIAGNOSIS — Z79899 Other long term (current) drug therapy: Secondary | ICD-10-CM | POA: Insufficient documentation

## 2014-05-10 DIAGNOSIS — G43909 Migraine, unspecified, not intractable, without status migrainosus: Secondary | ICD-10-CM | POA: Insufficient documentation

## 2014-05-10 LAB — COMPREHENSIVE METABOLIC PANEL
ALBUMIN: 3.8 g/dL (ref 3.5–5.2)
ALT: 16 U/L (ref 0–35)
ANION GAP: 12 (ref 5–15)
AST: 30 U/L (ref 0–37)
Alkaline Phosphatase: 97 U/L (ref 39–117)
BILIRUBIN TOTAL: 1.2 mg/dL (ref 0.3–1.2)
BUN: 9 mg/dL (ref 6–23)
CHLORIDE: 101 meq/L (ref 96–112)
CO2: 24 mEq/L (ref 19–32)
CREATININE: 0.58 mg/dL (ref 0.50–1.10)
Calcium: 9.6 mg/dL (ref 8.4–10.5)
GLUCOSE: 95 mg/dL (ref 70–99)
Potassium: 4.2 mEq/L (ref 3.7–5.3)
Sodium: 137 mEq/L (ref 137–147)
Total Protein: 8.6 g/dL — ABNORMAL HIGH (ref 6.0–8.3)

## 2014-05-10 LAB — CBC WITH DIFFERENTIAL/PLATELET
BASOS PCT: 1 % (ref 0–1)
Basophils Absolute: 0.1 10*3/uL (ref 0.0–0.1)
Eosinophils Absolute: 0.1 10*3/uL (ref 0.0–0.7)
Eosinophils Relative: 1 % (ref 0–5)
HCT: 26.4 % — ABNORMAL LOW (ref 36.0–46.0)
HEMOGLOBIN: 9.2 g/dL — AB (ref 12.0–15.0)
Lymphocytes Relative: 31 % (ref 12–46)
Lymphs Abs: 3 10*3/uL (ref 0.7–4.0)
MCH: 29 pg (ref 26.0–34.0)
MCHC: 34.8 g/dL (ref 30.0–36.0)
MCV: 83.3 fL (ref 78.0–100.0)
MONOS PCT: 8 % (ref 3–12)
Monocytes Absolute: 0.8 10*3/uL (ref 0.1–1.0)
Neutro Abs: 5.8 10*3/uL (ref 1.7–7.7)
Neutrophils Relative %: 59 % (ref 43–77)
Platelets: 718 10*3/uL — ABNORMAL HIGH (ref 150–400)
RBC: 3.17 MIL/uL — ABNORMAL LOW (ref 3.87–5.11)
RDW: 15.6 % — ABNORMAL HIGH (ref 11.5–15.5)
WBC: 9.8 10*3/uL (ref 4.0–10.5)

## 2014-05-10 LAB — URINALYSIS, ROUTINE W REFLEX MICROSCOPIC
BILIRUBIN URINE: NEGATIVE
GLUCOSE, UA: NEGATIVE mg/dL
Hgb urine dipstick: NEGATIVE
Ketones, ur: NEGATIVE mg/dL
Leukocytes, UA: NEGATIVE
Nitrite: NEGATIVE
PH: 6.5 (ref 5.0–8.0)
Protein, ur: NEGATIVE mg/dL
Specific Gravity, Urine: 1.012 (ref 1.005–1.030)
Urobilinogen, UA: 1 mg/dL (ref 0.0–1.0)

## 2014-05-10 LAB — RETICULOCYTES
RBC.: 3.17 MIL/uL — ABNORMAL LOW (ref 3.87–5.11)
Retic Count, Absolute: 304.3 10*3/uL — ABNORMAL HIGH (ref 19.0–186.0)
Retic Ct Pct: 9.6 % — ABNORMAL HIGH (ref 0.4–3.1)

## 2014-05-10 LAB — POC URINE PREG, ED: PREG TEST UR: NEGATIVE

## 2014-05-10 MED ORDER — HYDROMORPHONE HCL PF 1 MG/ML IJ SOLN
1.0000 mg | Freq: Once | INTRAMUSCULAR | Status: AC
  Administered 2014-05-10: 1 mg via INTRAVENOUS
  Filled 2014-05-10: qty 1

## 2014-05-10 MED ORDER — METOCLOPRAMIDE HCL 5 MG/ML IJ SOLN
10.0000 mg | Freq: Once | INTRAMUSCULAR | Status: AC
  Administered 2014-05-10: 10 mg via INTRAVENOUS
  Filled 2014-05-10: qty 2

## 2014-05-10 MED ORDER — KETOROLAC TROMETHAMINE 30 MG/ML IJ SOLN
30.0000 mg | Freq: Once | INTRAMUSCULAR | Status: AC
  Administered 2014-05-10: 30 mg via INTRAVENOUS
  Filled 2014-05-10: qty 1

## 2014-05-10 NOTE — ED Provider Notes (Signed)
CSN: [id number]     Arrival date & time 05/10/14  0719 History   First MD Initiated Contact with Patient 05/10/14 [phone]     Chief Complaint  Patient presents with  . Headache  . Sickle Cell Pain Crisis      HPI Patient reports worsening headache over the past 6-7 days.  She states her headache is bitemporal region.  She denies nausea and vomiting.  She reports photophobia.  No recent trauma or injury.  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  She's having some pain down her neck and into her spine.  She denies new weakness or arms or legs.  No fevers or chills.  No change in her vision.  No recent upper respiratory symptoms.  No sinus tenderness.  Pain is moderate in severity at this time.  Nothing worsens or improves the pain except as above   Past Medical History  Diagnosis Date  . Sickle cell crisis   . Depression   . Headache(784.0)   . Abortion     05/2012   Past Surgical History  Procedure Laterality Date  . Splenectomy    . Splenectomy, total     Family History  Problem Relation Age of Onset  . Hypertension Mother   . Sickle cell anemia Sister   . Kidney disease Sister   . Arthritis Sister   . Sickle cell anemia Sister   . Sickle cell trait Sister   . Heart disease Maternal Aunt   . Heart disease Maternal Uncle    History  Substance Use Topics  . Smoking status: Never Smoker   . Smokeless tobacco: Never Used  . Alcohol Use: No   OB History   Grav Para Term Preterm Abortions TAB SAB Ect Mult Living   1    1          Review of Systems  All other systems reviewed and are negative.     Allergies  Other  Home Medications   Prior to Admission medications   Medication Sig Start Date End Date Taking? Authorizing Provider  diphenhydramine-acetaminophen (TYLENOL PM) 25-500 MG TABS Take 2 tablets by mouth at bedtime as needed (for sleep and pain).   Yes Historical Provider, MD   HYDROcodone-acetaminophen (NORCO/VICODIN) 5-325 MG per tablet Take 2 tablets by mouth 2 (two) times daily.   Yes Historical Provider, MD   BP 101/69  Pulse 74  Temp(Src) 98.1 F (36.7 C) (Oral)  Resp 18  SpO2 95%  LMP 05/04/2014 Physical Exam  Nursing note and vitals reviewed. Constitutional: She is oriented to person, place, and time. She appears well-developed and well-nourished. No distress.  HENT:  Head: Normocephalic and atraumatic.  Eyes: EOM are normal. Pupils are equal, round, and reactive to light.  Neck: Normal range of motion.  Cardiovascular: Normal rate, regular rhythm and normal heart sounds.   Pulmonary/Chest: Effort normal and breath sounds normal.  Abdominal: Soft. She exhibits no distension. There is no tenderness.  Musculoskeletal: Normal range of motion.  Neurological: She is alert and oriented to person, place, and time.  5/5 strength in major muscle groups of  bilateral upper and lower extremities. Speech normal. No facial asymetry.   Skin: Skin is warm and dry.  Psychiatric: She has a normal mood and affect. Judgment normal.    ED Course  Procedures (including critical care time) Labs Review Labs Reviewed  CBC WITH DIFFERENTIAL - Abnormal;  Notable for the following:    RBC 3.17 (*)    Hemoglobin 9.2 (*)    HCT 26.4 (*)    RDW 15.6 (*)    Platelets 718 (*)    All other components within normal limits  COMPREHENSIVE METABOLIC PANEL - Abnormal; Notable for the following:    Total Protein 8.6 (*)    All other components within normal limits  RETICULOCYTES - Abnormal; Notable for the following:    Retic Ct Pct 9.6 (*)    RBC. 3.17 (*)    Retic Count, Manual 304.3 (*)    All other components within normal limits  URINALYSIS, ROUTINE W REFLEX MICROSCOPIC - Abnormal; Notable for the following:    APPearance CLOUDY (*)    All other components within normal limits  POC URINE PREG, ED    Imaging Review No results found.   EKG Interpretation None       MDM   Final diagnoses:  Migraine without status migrainosus, not intractable, unspecified migraine type    10:38 AM Patient feels much better after pain management in the emergency department.  Patient tolerated Reglan well and this seemed to significantly improved her headache.  Suspect migraine headache.  Doubt intracranial abnormality.  Doubt venous sinus thrombosis.  Close PCP followup and referral to neurology.  She understands to return to the ER for new or worsening symptoms.  Resolution of her headache at this time    Hoy Morn, MD 05/10/14 1039

## 2014-05-10 NOTE — ED Notes (Signed)
Pt c/o of headache since last Friday that has triggered her sickle cell pain. Pt c/o pain from her head down her spine.  Pt states that she has been trying to treat the pain at home for the past week.

## 2014-05-10 NOTE — ED Notes (Signed)
Pt escorted to discharge window. Pt verbalized understanding discharge instructions. In no acute distress.  

## 2014-05-10 NOTE — ED Notes (Signed)
Pt alert and oriented x4. Respirations even and unlabored, bilateral symmetrical rise and fall of chest. Skin warm and dry. In no acute distress. Denies needs.   

## 2014-05-13 ENCOUNTER — Encounter (HOSPITAL_COMMUNITY): Payer: Self-pay | Admitting: Emergency Medicine

## 2014-05-13 ENCOUNTER — Emergency Department (HOSPITAL_COMMUNITY)
Admission: EM | Admit: 2014-05-13 | Discharge: 2014-05-13 | Disposition: A | Discharge status: Home / Self Care | Payer: PRIVATE HEALTH INSURANCE | Attending: Emergency Medicine | Admitting: Emergency Medicine

## 2014-05-13 ENCOUNTER — Emergency Department (HOSPITAL_COMMUNITY): Payer: PRIVATE HEALTH INSURANCE

## 2014-05-13 DIAGNOSIS — Z79899 Other long term (current) drug therapy: Secondary | ICD-10-CM | POA: Insufficient documentation

## 2014-05-13 DIAGNOSIS — M791 Myalgia, unspecified site: Secondary | ICD-10-CM

## 2014-05-13 DIAGNOSIS — Z862 Personal history of diseases of the blood and blood-forming organs and certain disorders involving the immune mechanism: Secondary | ICD-10-CM | POA: Insufficient documentation

## 2014-05-13 DIAGNOSIS — R519 Headache, unspecified: Secondary | ICD-10-CM

## 2014-05-13 DIAGNOSIS — Z8659 Personal history of other mental and behavioral disorders: Secondary | ICD-10-CM | POA: Insufficient documentation

## 2014-05-13 DIAGNOSIS — H538 Other visual disturbances: Secondary | ICD-10-CM | POA: Insufficient documentation

## 2014-05-13 DIAGNOSIS — R51 Headache: Secondary | ICD-10-CM | POA: Insufficient documentation

## 2014-05-13 DIAGNOSIS — IMO0001 Reserved for inherently not codable concepts without codable children: Secondary | ICD-10-CM | POA: Insufficient documentation

## 2014-05-13 LAB — RETICULOCYTES
RBC.: 2.98 MIL/uL — ABNORMAL LOW (ref 3.87–5.11)
Retic Count, Absolute: 259.3 10*3/uL — ABNORMAL HIGH (ref 19.0–186.0)
Retic Ct Pct: 8.7 % — ABNORMAL HIGH (ref 0.4–3.1)

## 2014-05-13 LAB — CBC WITH DIFFERENTIAL/PLATELET
Basophils Absolute: 0 10*3/uL (ref 0.0–0.1)
Basophils Relative: 0 % (ref 0–1)
Eosinophils Absolute: 0.1 10*3/uL (ref 0.0–0.7)
Eosinophils Relative: 1 % (ref 0–5)
HCT: 24.6 % — ABNORMAL LOW (ref 36.0–46.0)
Hemoglobin: 8.6 g/dL — ABNORMAL LOW (ref 12.0–15.0)
Lymphocytes Relative: 29 % (ref 12–46)
Lymphs Abs: 3.8 10*3/uL (ref 0.7–4.0)
MCH: 28.9 pg (ref 26.0–34.0)
MCHC: 35 g/dL (ref 30.0–36.0)
MCV: 82.6 fL (ref 78.0–100.0)
Monocytes Absolute: 1 10*3/uL (ref 0.1–1.0)
Monocytes Relative: 8 % (ref 3–12)
Neutro Abs: 7.9 10*3/uL — ABNORMAL HIGH (ref 1.7–7.7)
Neutrophils Relative %: 62 % (ref 43–77)
Platelets: 606 10*3/uL — ABNORMAL HIGH (ref 150–400)
RBC: 2.98 MIL/uL — ABNORMAL LOW (ref 3.87–5.11)
RDW: 15.4 % (ref 11.5–15.5)
WBC: 12.9 10*3/uL — ABNORMAL HIGH (ref 4.0–10.5)

## 2014-05-13 LAB — BASIC METABOLIC PANEL
Anion gap: 12 (ref 5–15)
BUN: 8 mg/dL (ref 6–23)
CO2: 24 mEq/L (ref 19–32)
Calcium: 9.9 mg/dL (ref 8.4–10.5)
Chloride: 102 mEq/L (ref 96–112)
Creatinine, Ser: 0.63 mg/dL (ref 0.50–1.10)
GFR calc Af Amer: >90 mL/min (ref >90)
GFR calc non Af Amer: >90 mL/min (ref >90)
Glucose, Bld: 100 mg/dL — ABNORMAL HIGH (ref 70–99)
Potassium: 3.9 mEq/L (ref 3.7–5.3)
Sodium: 138 mEq/L (ref 137–147)

## 2014-05-13 MED ORDER — METOCLOPRAMIDE HCL 5 MG/ML IJ SOLN
10.0000 mg | Freq: Once | INTRAMUSCULAR | Status: AC
  Administered 2014-05-13: 10 mg via INTRAVENOUS
  Filled 2014-05-13: qty 2

## 2014-05-13 MED ORDER — KETOROLAC TROMETHAMINE 30 MG/ML IJ SOLN
30.0000 mg | Freq: Once | INTRAMUSCULAR | Status: AC
  Administered 2014-05-13: 30 mg via INTRAVENOUS
  Filled 2014-05-13: qty 1

## 2014-05-13 MED ORDER — SODIUM CHLORIDE 0.9 % IV BOLUS (SEPSIS)
1000.0000 mL | Freq: Once | INTRAVENOUS | Status: AC
  Administered 2014-05-13: 1000 mL via INTRAVENOUS

## 2014-05-13 MED ORDER — DIPHENHYDRAMINE HCL 50 MG/ML IJ SOLN
25.0000 mg | Freq: Once | INTRAMUSCULAR | Status: AC
  Administered 2014-05-13: 25 mg via INTRAVENOUS
  Filled 2014-05-13: qty 1

## 2014-05-13 MED ORDER — ASPIRIN-ACETAMINOPHEN-CAFFEINE 250-250-65 MG PO TABS: 2.0000 | ORAL_TABLET | Freq: Four times a day (QID) | ORAL | Status: DC | PRN

## 2014-05-13 NOTE — ED Notes (Signed)
Unsuccessful IV attempt.

## 2014-05-13 NOTE — Discharge Instructions (Signed)
Today you were evaluated for your persistent frontal headache and body aches.  Your physical exam, lab work and head CT were normal. No obvious source of your headache at this time.  It is important to get plenty of sleep, at least 8 hours a night, stay well hydrated drinking preferably water at least 8 glasses per day, and please follow up with Dr. Alyson Ingles for ongoing healthcare needs and recheck of your headache if not improving in 2-3 days.  See below for further instructions.

## 2014-05-13 NOTE — ED Provider Notes (Signed)
CSN: [id number]     Arrival date & time 05/13/14  1534 History   First MD Initiated Contact with Patient 05/13/14 1728     Chief Complaint  Patient presents with  . Migraine     (Consider location/radiation/quality/duration/timing/severity/associated sxs/prior Treatment) HPI Pt is a 23yo female with hx of sickle cell disease, depression, and headaches presenting to ED c/o ongoing gradually waxing and waning frontal headache that started 4 days ago, associated with blurred vision.  Headache is aching, in front of head, around her eyes, 10/10.  Reports being seen in ED for same as well as a sickle cell pain crisis on 7/10.  Pt was given dilaudid which did not help with the pain but states the toradol given did help her headache but it never went away completely.  Pt is concerned something worse may be going wrong as she has tried her home pain medications of vicodin, and OTC tension headache medication as well as allergy medication w/o relief.  States her headache is also triggering her sickle cell pain in her back, arms and legs but denies chest pain or SOB.  Denies fever, n/v/d. Denies dizziness or change in gait. Denies hx of migraines or being seen by neurologist. Denies head trauma.    PCP: Dr. Alyson Ingles  Past Medical History  Diagnosis Date  . Sickle cell crisis   . Depression   . Headache(784.0)   . Abortion     05/2012   Past Surgical History  Procedure Laterality Date  . Splenectomy    . Splenectomy, total     Family History  Problem Relation Age of Onset  . Hypertension Mother   . Sickle cell anemia Sister   . Kidney disease Sister   . Arthritis Sister   . Sickle cell anemia Sister   . Sickle cell trait Sister   . Heart disease Maternal Aunt   . Heart disease Maternal Uncle    History  Substance Use Topics  . Smoking status: Never Smoker   . Smokeless tobacco: Never Used  . Alcohol Use: No   OB History   Grav Para Term Preterm Abortions TAB SAB Ect Mult Living   1     1          Review of Systems  Constitutional: Negative for fever and chills.  Eyes: Positive for visual disturbance ( blurred vision). Negative for photophobia and pain.  Respiratory: Negative for cough and shortness of breath.   Cardiovascular: Negative for chest pain and palpitations.  Gastrointestinal: Negative for nausea, vomiting, abdominal pain and diarrhea.  Musculoskeletal: Positive for arthralgias and myalgias. Negative for neck pain and neck stiffness.  Neurological: Positive for headaches. Negative for dizziness and light-headedness.  All other systems reviewed and are negative.     Allergies  Other  Home Medications   Prior to Admission medications   Medication Sig Start Date End Date Taking? Authorizing Provider  Acetaminophen-Caffeine 500-65 MG TABS Take 3 tablets by mouth 3 (three) times daily as needed (headaches).   Yes Historical Provider, MD  cetirizine (ZYRTEC) 10 MG tablet Take 10 mg by mouth daily.   Yes Historical Provider, MD  diphenhydramine-acetaminophen (TYLENOL PM) 25-500 MG TABS Take 2 tablets by mouth as needed (pain).   Yes Historical Provider, MD  HYDROcodone-acetaminophen (NORCO/VICODIN) 5-325 MG per tablet Take 2 tablets by mouth 2 (two) times daily.   Yes Historical Provider, MD  aspirin-acetaminophen-caffeine (EXCEDRIN MIGRAINE) [phone] MG per tablet Take 2 tablets by mouth every 6 (six) hours  as needed for headache. 05/13/14   Noland Fordyce, PA-C   BP 105/53  Pulse 94  Temp(Src) 98.7 F (37.1 C) (Oral)  Resp 16  SpO2 98%  LMP 05/04/2014 Physical Exam  Nursing note and vitals reviewed. Constitutional: She is oriented to person, place, and time. She appears well-developed and well-nourished. No distress.  Pt sitting in exam bed, texting on phone, watching television. Appears mildly uncomfortable.   HENT:  Head: Normocephalic and atraumatic.  Eyes: Conjunctivae and EOM are normal. Pupils are equal, round, and reactive to light. Right  eye exhibits no discharge. Left eye exhibits no discharge. No scleral icterus.  Neck: Normal range of motion. Neck supple.  No nuchal rigidity or meningeal signs.  Cardiovascular: Normal rate, regular rhythm and normal heart sounds.   Pulmonary/Chest: Effort normal and breath sounds normal. No respiratory distress. She has no wheezes. She has no rales. She exhibits no tenderness.  Abdominal: Soft. Bowel sounds are normal. She exhibits no distension and no mass. There is no tenderness. There is no rebound and no guarding.  Musculoskeletal: Normal range of motion. She exhibits no edema and no tenderness.  Neurological: She is alert and oriented to person, place, and time. She has normal strength. No cranial nerve deficit or sensory deficit. She displays a negative Romberg sign. Coordination and gait normal. GCS eye subscore is 4. GCS verbal subscore is 5. GCS motor subscore is 6.  Skin: Skin is warm and dry. She is not diaphoretic.    ED Course  Procedures (including critical care time) Labs Review Labs Reviewed  CBC WITH DIFFERENTIAL - Abnormal; Notable for the following:    WBC 12.9 (*)    RBC 2.98 (*)    Hemoglobin 8.6 (*)    HCT 24.6 (*)    Platelets 606 (*)    Neutro Abs 7.9 (*)    All other components within normal limits  BASIC METABOLIC PANEL - Abnormal; Notable for the following:    Glucose, Bld 100 (*)    All other components within normal limits  RETICULOCYTES - Abnormal; Notable for the following:    Retic Ct Pct 8.7 (*)    RBC. 2.98 (*)    Retic Count, Manual 259.3 (*)    All other components within normal limits    Imaging Review Ct Head Wo Contrast  05/13/2014   CLINICAL DATA:  Four day history of frontal headaches  EXAM: CT HEAD WITHOUT CONTRAST  TECHNIQUE: Contiguous axial images were obtained from the base of the skull through the vertex without intravenous contrast.  COMPARISON:  None.  FINDINGS: The ventricles are normal in size and configuration. The right  lateral ventricle is slightly larger than the left lateral ventricle, an anatomic variant. There is no mass, hemorrhage, extra-axial fluid collection, or midline shift. Gray-white compartments appear normal. No demonstrable acute infarct. Bony calvarium appears intact. The mastoid air cells are clear.  IMPRESSION: Study within normal limits.   Electronically Signed   By: Lowella Grip M.D.   On: 05/13/2014 18:59     EKG Interpretation None      MDM   Final diagnoses:  Frontal headache  Myalgia    Pt is a 23yo female with hx of sickle cell disease presenting to ED c/o 4 day hx of persistent frontal headache associated with blurred vision and diffuse "sickle cell pain" but denies chest pain or SOB.  Not concerned for acute chest syndrome. Pt watching television and texting in exam room. Normal neuro exam, no obvious  focal deficit.  Denies hx of head trauma. Doubt emergent process, however, due to duration of HA with hx of sickcle cell, will get head CT and recheck labs. Labs: unremarkable, consistent with previous.   Pt given IV fluids, toradol, reglan and benadryl with relief of pain.   CT head: normal  Home care instructions provided. Rx: Excedrin. Will discharge pt home to f/u with PCP. Return precautions provided. Pt verbalized understanding and agreement with tx plan.    Noland Fordyce, PA-C 05/13/14 2001

## 2014-05-13 NOTE — ED Notes (Signed)
Pt reports to ED for migraine and blurred vision, states she was seen Friday for the same, pt worries that something worse may be wrong. Denies dizziness, n/v.

## 2014-05-13 NOTE — ED Provider Notes (Signed)
Medical screening examination/treatment/procedure(s) were performed by non-physician practitioner and as supervising physician I was immediately available for consultation/collaboration.   EKG Interpretation None        Orpah Greek, MD 05/13/14 2119

## 2014-05-13 NOTE — ED Notes (Signed)
Patient upset that narcotics have not been ordered for [facility] although her chief complaint was migraine. Nurse to speak with PA.

## 2014-05-13 NOTE — ED Notes (Signed)
Patient transported to CT 

## 2014-05-16 ENCOUNTER — Encounter (HOSPITAL_COMMUNITY): Payer: Self-pay | Admitting: Emergency Medicine

## 2014-05-16 ENCOUNTER — Emergency Department (HOSPITAL_COMMUNITY): Payer: PRIVATE HEALTH INSURANCE

## 2014-05-16 ENCOUNTER — Inpatient Hospital Stay (HOSPITAL_COMMUNITY)
Admission: EM | Admit: 2014-05-16 | Discharge: 2014-05-18 | DRG: 812 | Disposition: A | Discharge status: Home / Self Care | Payer: PRIVATE HEALTH INSURANCE | Attending: Internal Medicine | Admitting: Internal Medicine

## 2014-05-16 DIAGNOSIS — Z79899 Other long term (current) drug therapy: Secondary | ICD-10-CM

## 2014-05-16 DIAGNOSIS — Z8249 Family history of ischemic heart disease and other diseases of the circulatory system: Secondary | ICD-10-CM | POA: Diagnosis not present

## 2014-05-16 DIAGNOSIS — Z888 Allergy status to other drugs, medicaments and biological substances status: Secondary | ICD-10-CM | POA: Diagnosis not present

## 2014-05-16 DIAGNOSIS — D5700 Hb-SS disease with crisis, unspecified: Principal | ICD-10-CM | POA: Diagnosis present

## 2014-05-16 DIAGNOSIS — G43909 Migraine, unspecified, not intractable, without status migrainosus: Secondary | ICD-10-CM | POA: Diagnosis present

## 2014-05-16 DIAGNOSIS — R519 Headache, unspecified: Secondary | ICD-10-CM

## 2014-05-16 DIAGNOSIS — R51 Headache: Secondary | ICD-10-CM | POA: Diagnosis not present

## 2014-05-16 DIAGNOSIS — F329 Major depressive disorder, single episode, unspecified: Secondary | ICD-10-CM | POA: Diagnosis present

## 2014-05-16 DIAGNOSIS — Z832 Family history of diseases of the blood and blood-forming organs and certain disorders involving the immune mechanism: Secondary | ICD-10-CM | POA: Diagnosis not present

## 2014-05-16 DIAGNOSIS — F3289 Other specified depressive episodes: Secondary | ICD-10-CM | POA: Diagnosis present

## 2014-05-16 DIAGNOSIS — O99019 Anemia complicating pregnancy, unspecified trimester: Secondary | ICD-10-CM

## 2014-05-16 DIAGNOSIS — Z9089 Acquired absence of other organs: Secondary | ICD-10-CM

## 2014-05-16 DIAGNOSIS — D589 Hereditary hemolytic anemia, unspecified: Secondary | ICD-10-CM

## 2014-05-16 DIAGNOSIS — D571 Sickle-cell disease without crisis: Secondary | ICD-10-CM | POA: Diagnosis present

## 2014-05-16 LAB — DIFFERENTIAL
BASOS ABS: 0 10*3/uL (ref 0.0–0.1)
Basophils Relative: 0 % (ref 0–1)
EOS ABS: 0.1 10*3/uL (ref 0.0–0.7)
EOS PCT: 1 % (ref 0–5)
LYMPHS ABS: 4 10*3/uL (ref 0.7–4.0)
Lymphocytes Relative: 37 % (ref 12–46)
Monocytes Absolute: 1 10*3/uL (ref 0.1–1.0)
Monocytes Relative: 9 % (ref 3–12)
Neutro Abs: 5.8 10*3/uL (ref 1.7–7.7)
Neutrophils Relative %: 53 % (ref 43–77)

## 2014-05-16 LAB — HEPATIC FUNCTION PANEL
ALK PHOS: 86 U/L (ref 39–117)
ALT: 15 U/L (ref 0–35)
AST: 26 U/L (ref 0–37)
Albumin: 3.6 g/dL (ref 3.5–5.2)
Bilirubin, Direct: 0.2 mg/dL (ref 0.0–0.3)
Indirect Bilirubin: 1.1 mg/dL — ABNORMAL HIGH (ref 0.3–0.9)
TOTAL PROTEIN: 8.2 g/dL (ref 6.0–8.3)
Total Bilirubin: 1.3 mg/dL — ABNORMAL HIGH (ref 0.3–1.2)

## 2014-05-16 LAB — I-STAT CHEM 8, ED
BUN: 6 mg/dL (ref 6–23)
CHLORIDE: 108 meq/L (ref 96–112)
Calcium, Ion: 1.23 mmol/L (ref 1.12–1.23)
Creatinine, Ser: 0.7 mg/dL (ref 0.50–1.10)
GLUCOSE: 91 mg/dL (ref 70–99)
HEMATOCRIT: 26 % — AB (ref 36.0–46.0)
Hemoglobin: 8.8 g/dL — ABNORMAL LOW (ref 12.0–15.0)
Potassium: 3.5 mEq/L — ABNORMAL LOW (ref 3.7–5.3)
Sodium: 143 mEq/L (ref 137–147)
TCO2: 25 mmol/L (ref 0–100)

## 2014-05-16 LAB — PREGNANCY, URINE: Preg Test, Ur: NEGATIVE

## 2014-05-16 LAB — CBC
HEMATOCRIT: 22.8 % — AB (ref 36.0–46.0)
Hemoglobin: 7.8 g/dL — ABNORMAL LOW (ref 12.0–15.0)
MCH: 28.6 pg (ref 26.0–34.0)
MCHC: 34.2 g/dL (ref 30.0–36.0)
MCV: 83.5 fL (ref 78.0–100.0)
Platelets: 620 10*3/uL — ABNORMAL HIGH (ref 150–400)
RBC: 2.73 MIL/uL — ABNORMAL LOW (ref 3.87–5.11)
RDW: 16.4 % — ABNORMAL HIGH (ref 11.5–15.5)
WBC: 11.2 10*3/uL — AB (ref 4.0–10.5)

## 2014-05-16 LAB — URINALYSIS, ROUTINE W REFLEX MICROSCOPIC
Bilirubin Urine: NEGATIVE
Glucose, UA: NEGATIVE mg/dL
HGB URINE DIPSTICK: NEGATIVE
Ketones, ur: NEGATIVE mg/dL
LEUKOCYTES UA: NEGATIVE
Nitrite: NEGATIVE
PH: 6 (ref 5.0–8.0)
Protein, ur: NEGATIVE mg/dL
Specific Gravity, Urine: 1.015 (ref 1.005–1.030)
Urobilinogen, UA: 1 mg/dL (ref 0.0–1.0)

## 2014-05-16 LAB — LACTATE DEHYDROGENASE: LDH: 244 U/L (ref 94–250)

## 2014-05-16 LAB — RETICULOCYTES
RBC.: 2.73 MIL/uL — ABNORMAL LOW (ref 3.87–5.11)
Retic Count, Absolute: 275.7 10*3/uL — ABNORMAL HIGH (ref 19.0–186.0)
Retic Ct Pct: 10.1 % — ABNORMAL HIGH (ref 0.4–3.1)

## 2014-05-16 MED ORDER — FOLIC ACID 1 MG PO TABS
1.0000 mg | ORAL_TABLET | Freq: Every day | ORAL | Status: DC
  Administered 2014-05-16 – 2014-05-18 (×3): 1 mg via ORAL
  Filled 2014-05-16 (×3): qty 1

## 2014-05-16 MED ORDER — SODIUM CHLORIDE 0.9 % IV BOLUS (SEPSIS)
1000.0000 mL | Freq: Once | INTRAVENOUS | Status: AC
  Administered 2014-05-16: 1000 mL via INTRAVENOUS

## 2014-05-16 MED ORDER — ONDANSETRON HCL 4 MG/2ML IJ SOLN
4.0000 mg | Freq: Four times a day (QID) | INTRAMUSCULAR | Status: DC | PRN
  Administered 2014-05-16 – 2014-05-18 (×6): 4 mg via INTRAVENOUS
  Filled 2014-05-16 (×6): qty 2

## 2014-05-16 MED ORDER — HYDROMORPHONE HCL PF 1 MG/ML IJ SOLN
1.0000 mg | Freq: Once | INTRAMUSCULAR | Status: AC
  Administered 2014-05-16: 1 mg via INTRAVENOUS
  Filled 2014-05-16: qty 1

## 2014-05-16 MED ORDER — HYDROMORPHONE 0.3 MG/ML IV SOLN
INTRAVENOUS | Status: DC
  Administered 2014-05-16: 0.3 mg via INTRAVENOUS
  Administered 2014-05-16: 1.2 mg via INTRAVENOUS
  Administered 2014-05-16: 12:00:00 via INTRAVENOUS
  Administered 2014-05-17 – 2014-05-18 (×4): 0.3 mg via INTRAVENOUS
  Filled 2014-05-16: qty 25

## 2014-05-16 MED ORDER — HYDROMORPHONE HCL PF 2 MG/ML IJ SOLN
2.0000 mg | Freq: Once | INTRAMUSCULAR | Status: AC
  Administered 2014-05-16: 2 mg via INTRAVENOUS
  Filled 2014-05-16: qty 1

## 2014-05-16 MED ORDER — SODIUM CHLORIDE 0.45 % IV SOLN
INTRAVENOUS | Status: DC
  Administered 2014-05-16 – 2014-05-17 (×3): via INTRAVENOUS

## 2014-05-16 MED ORDER — SODIUM CHLORIDE 0.9 % IJ SOLN: 9.0000 mL | INTRAMUSCULAR | Status: DC | PRN

## 2014-05-16 MED ORDER — DIPHENHYDRAMINE HCL 50 MG/ML IJ SOLN: 12.5000 mg | Freq: Four times a day (QID) | INTRAMUSCULAR | Status: DC | PRN

## 2014-05-16 MED ORDER — ONDANSETRON HCL 4 MG/2ML IJ SOLN
4.0000 mg | Freq: Once | INTRAMUSCULAR | Status: AC
  Administered 2014-05-16: 4 mg via INTRAVENOUS
  Filled 2014-05-16: qty 2

## 2014-05-16 MED ORDER — KETOROLAC TROMETHAMINE 30 MG/ML IJ SOLN
30.0000 mg | Freq: Once | INTRAMUSCULAR | Status: AC
  Administered 2014-05-16: 30 mg via INTRAMUSCULAR
  Filled 2014-05-16: qty 1

## 2014-05-16 MED ORDER — KETOROLAC TROMETHAMINE 30 MG/ML IJ SOLN
30.0000 mg | Freq: Four times a day (QID) | INTRAMUSCULAR | Status: DC
  Administered 2014-05-16 – 2014-05-18 (×8): 30 mg via INTRAVENOUS
  Filled 2014-05-16 (×7): qty 1
  Filled 2014-05-16: qty 2
  Filled 2014-05-16 (×5): qty 1

## 2014-05-16 MED ORDER — DIPHENHYDRAMINE HCL 25 MG PO CAPS: 25.0000 mg | ORAL_CAPSULE | Freq: Four times a day (QID) | ORAL | Status: DC | PRN

## 2014-05-16 MED ORDER — LORATADINE 10 MG PO TABS
10.0000 mg | ORAL_TABLET | Freq: Every day | ORAL | Status: DC
  Administered 2014-05-16 – 2014-05-18 (×3): 10 mg via ORAL
  Filled 2014-05-16 (×3): qty 1

## 2014-05-16 MED ORDER — HYDROCODONE-ACETAMINOPHEN 5-325 MG PO TABS
1.0000 | ORAL_TABLET | Freq: Four times a day (QID) | ORAL | Status: DC
Start: 2014-05-16 — End: 2014-05-17
  Administered 2014-05-16 – 2014-05-17 (×4): 1 via ORAL
  Filled 2014-05-16 (×5): qty 1

## 2014-05-16 MED ORDER — NALOXONE HCL 0.4 MG/ML IJ SOLN: 0.4000 mg | INTRAMUSCULAR | Status: DC | PRN

## 2014-05-16 MED ORDER — DOCUSATE SODIUM 100 MG PO CAPS
100.0000 mg | ORAL_CAPSULE | Freq: Two times a day (BID) | ORAL | Status: DC
  Administered 2014-05-16 – 2014-05-18 (×5): 100 mg via ORAL
  Filled 2014-05-16 (×6): qty 1

## 2014-05-16 MED ORDER — ENOXAPARIN SODIUM 40 MG/0.4ML ~~LOC~~ SOLN
40.0000 mg | SUBCUTANEOUS | Status: DC
Start: 2014-05-16 — End: 2014-05-18
  Administered 2014-05-16 – 2014-05-18 (×3): 40 mg via SUBCUTANEOUS
  Filled 2014-05-16 (×5): qty 0.4

## 2014-05-16 MED ORDER — DIPHENHYDRAMINE HCL 12.5 MG/5ML PO ELIX: 12.5000 mg | ORAL_SOLUTION | Freq: Four times a day (QID) | ORAL | Status: DC | PRN

## 2014-05-16 MED ORDER — LORAZEPAM 2 MG/ML IJ SOLN
1.0000 mg | Freq: Once | INTRAMUSCULAR | Status: AC
  Administered 2014-05-16: 1 mg via INTRAVENOUS
  Filled 2014-05-16: qty 1

## 2014-05-16 MED ORDER — SUMATRIPTAN 20 MG/ACT NA SOLN: 20.0000 mg | NASAL | Status: DC | PRN

## 2014-05-16 NOTE — ED Notes (Signed)
Hospitalist at bedside 

## 2014-05-16 NOTE — ED Notes (Signed)
Bed: TG54 Expected date:  Expected time:  Means of arrival:  Comments: EMS Headache

## 2014-05-16 NOTE — H&P (Addendum)
[facility] History and Physical  BRYLEA PITA YBO:[id number] DOB: [birth year] DOA: 05/16/2014  Referring physician: ER physician. PCP: [name], MD   Chief Complaint: Headache and right sided body ache.  HPI: Carmen [name] is a 23 y.o. female with history of sickle cell disease has been experiencing headaches and right-sided body aches for last 3 weeks. Patient states that the right-sided body aches is typical of her sickle cell crisis. She has been having frontal headache with no photophobia fever chills or neck pain on neck rigidity. She states she has had a history of migraine. In the ER CT head did not show anything acute and patient on exam is nonfocal. Patient has been given multiple doses of pain relief medications and at this time has been admitted for further pain management due to sickle cell crisis and possible migraine. In the ER patient had one episode of nausea vomiting after receiving Dilaudid. Patient was given one dose of Ativan following which patient became very somnolent. Presently patient is more alert and awake. Patient only takes hydrocodone for pain relief at home and is not on any long-acting pain relief medications. Patient denies any chest pain shortness of breath or any focal deficits.  Review of Systems: As presented in the history of presenting illness, rest negative.  Past Medical History  Diagnosis Date  . Sickle cell crisis   . Depression   . Headache(784.0)   . Abortion     05/2012   Past Surgical History  Procedure Laterality Date  . Splenectomy    . Splenectomy, total     Social History:  reports that she has never smoked. She has never used smokeless tobacco. She reports that she does not drink alcohol or use illicit drugs. Where does patient live home. Can patient participate in ADLs? Yes.  Allergies  Allergen Reactions  . Other Nausea Only    Patient stated that all iv pain medication make her have really bad nausea     Family History:  Family History  Problem Relation Age of Onset  . Hypertension Mother   . Sickle cell anemia Sister   . Kidney disease Sister   . Arthritis Sister   . Sickle cell anemia Sister   . Sickle cell trait Sister   . Heart disease Maternal Aunt   . Heart disease Maternal Uncle       Prior to Admission medications   Medication Sig Start Date End Date Taking? Authorizing Provider  aspirin-acetaminophen-caffeine (EXCEDRIN MIGRAINE) [phone] MG per tablet Take 2 tablets by mouth every 6 (six) hours as needed for headache. 05/13/14  Yes Noland Fordyce, PA-C  cetirizine (ZYRTEC) 10 MG tablet Take 10 mg by mouth daily.    Yes Historical Provider, MD  diphenhydramine-acetaminophen (TYLENOL PM) 25-500 MG TABS Take 2 tablets by mouth as needed (pain/sleep).    Yes Historical Provider, MD  HYDROcodone-acetaminophen (NORCO/VICODIN) 5-325 MG per tablet Take 2 tablets by mouth 2 (two) times daily.   Yes Historical Provider, MD    Physical Exam: Filed Vitals:   05/16/14 0145 05/16/14 0302 05/16/14 0329 05/16/14 0545  BP: 138/80 108/65 106/61 111/67  Pulse: 100 85 86 116  Temp: 98.9 F (37.2 C)  98.4 F (36.9 C)   TempSrc: Oral  Oral   Resp: 20 18 20    SpO2: 99% 99% 100% 100%     General:  Well-developed and nourished.  Eyes: Anicteric no pallor.  ENT: No discharge from the ears eyes nose mouth.  Neck: No  mass felt. No neck rigidity.  Cardiovascular: S1-S2 heard.  Respiratory: No rhonchi or crepitations.  Abdomen: Soft nontender bowel sounds present. No guarding rigidity.  Skin: No rash.  Musculoskeletal: No edema.  Psychiatric: Appears normal.  Neurologic: Alert awake oriented to time place and person. Moves all extremities.  Labs on Admission:  Basic Metabolic Panel:  Recent Labs Lab 05/10/14 0845 05/13/14 1818 05/16/14 0222  NA 137 138 143  K 4.2 3.9 3.5*  CL 101 102 108  CO2 24 24  --   GLUCOSE 95 100* 91  BUN 9 8 6   CREATININE 0.58 0.63 0.70   CALCIUM 9.6 9.9  --    Liver Function Tests:  Recent Labs Lab 05/10/14 0845 05/16/14 0533  AST 30 26  ALT 16 15  ALKPHOS 97 86  BILITOT 1.2 1.3*  PROT 8.6* 8.2  ALBUMIN 3.8 3.6   No results found for this basename: LIPASE, AMYLASE,  in the last 168 hours No results found for this basename: AMMONIA,  in the last 168 hours CBC:  Recent Labs Lab 05/10/14 0845 05/13/14 1818 05/16/14 0208 05/16/14 0222  WBC 9.8 12.9* 11.2*  --   NEUTROABS 5.8 7.9*  --   --   HGB 9.2* 8.6* 7.8* 8.8*  HCT 26.4* 24.6* 22.8* 26.0*  MCV 83.3 82.6 83.5  --   PLT 718* 606* 620*  --    Cardiac Enzymes: No results found for this basename: CKTOTAL, CKMB, CKMBINDEX, TROPONINI,  in the last 168 hours  BNP (last 3 results) No results found for this basename: PROBNP,  in the last 8760 hours CBG: No results found for this basename: GLUCAP,  in the last 168 hours  Radiological Exams on Admission: Ct Head Wo Contrast  05/16/2014   HISTORY OF PRESENT ILLNESS: Sickle cell disease, worsening headache.  EXAM: CT HEAD WITHOUT CONTRAST  TECHNIQUE: Contiguous axial images were obtained from the base of the skull through the vertex without intravenous contrast.  COMPARISON:  CT of the head May 13, 2014  FINDINGS: The ventricles and sulci are normal. No intraparenchymal hemorrhage, mass effect nor midline shift. No acute large vascular territory infarcts.  No abnormal extra-axial fluid collections. Basal cisterns are patent.  No skull fracture. The included ocular globes and orbital contents are non-suspicious. The mastoid aircells and included paranasal sinuses are well-aerated. Fullness of the nasopharyngeal soft tissues can be seen with patient's provided young age.  IMPRESSION: No acute intracranial process, stable appearance of the head from May 13, 2014.   Electronically Signed   By: Elon Alas   On: 05/16/2014 04:18    Assessment/Plan Principal Problem:   Sickle cell crisis Active Problems:    Sickle cell pain crisis   Anemia   1. Sickle cell pain crisis - since patient is not on any long-acting pain any medications at this time I have placed patient on full dose Dilaudid PCA instead of the custom dose. Continue with gentle hydration. 2. Headache - may be related to #1 or probably having migraine. Patient's pain has been chronic. At this time they will observe patient's headache if it's improving with PCA. If pain persists may consider MRV. 3. Anemia from sickle cell crisis - follow CBC.  Urine pregnancy is pending.    Code Status: Full code.  Family Communication: None.  Disposition Plan: Admit to inpatient.    KAKRAKANDY,ARSHAD N. [facility] Pager [phone].  If 7PM-7AM, please contact night-coverage [email or web address] Password [email or web address] 05/16/2014, 6:33 AM

## 2014-05-16 NOTE — ED Notes (Signed)
Brought in by EMS from home with c/o headache.  Pt reports that she was seen here last Tuesday for her headache and was given "migraine cocktail". Pt reports that pain medicine given lasted "only for 30 minutes".  Pt reports that her headache persisted and is getting worse--- pt reported that she called her PCP and was advised to come to ED d/t worsening symptoms: pt now also has pain to right side of her body, from right arm down to her right ankle; pt thinks it may be related to her "sickle cell".

## 2014-05-16 NOTE — ED Provider Notes (Signed)
CSN: [id number]     Arrival date & time 05/16/14  0139 History   First MD Initiated Contact with Patient 05/16/14 0155     Chief Complaint  Patient presents with  . Headache     (Consider location/radiation/quality/duration/timing/severity/associated sxs/prior Treatment) HPI Comments: Patient with SCD, presents with persistent Headache for the past 3 days associated with R side weakness and pain that is consistent with her previous crisis   Has been taking her normal home medications without relief. Denies nausea,   Patient is a 23 y.o. female presenting with headaches. The history is provided by the patient.  Headache Pain location:  Generalized Quality:  Dull Radiates to:  Does not radiate Severity currently:  9/10 Severity at highest:  10/10 Onset quality:  Gradual Duration:  3 hours Timing:  Constant Progression:  Worsening Chronicity:  Recurrent Similar to prior headaches: yes   Worsened by:  Nothing tried Ineffective treatments:  Prescription medications Associated symptoms: photophobia and weakness   Associated symptoms: no blurred vision, no congestion, no dizziness, no pain, no fever, no loss of balance, no nausea, no neck pain and no numbness   Weakness:    Severity:  Moderate   Onset quality:  Gradual   Timing:  Constant   Progression:  Unchanged   Chronicity:  Recurrent   Past Medical History  Diagnosis Date  . Sickle cell crisis   . Depression   . Headache(784.0)   . Abortion     05/2012   Past Surgical History  Procedure Laterality Date  . Splenectomy    . Splenectomy, total     Family History  Problem Relation Age of Onset  . Hypertension Mother   . Sickle cell anemia Sister   . Kidney disease Sister   . Arthritis Sister   . Sickle cell anemia Sister   . Sickle cell trait Sister   . Heart disease Maternal Aunt   . Heart disease Maternal Uncle    History  Substance Use Topics  . Smoking status: Never Smoker   . Smokeless tobacco: Never Used   . Alcohol Use: No   OB History   Grav Para Term Preterm Abortions TAB SAB Ect Mult Living   1    1          Review of Systems  Constitutional: Negative for fever.  HENT: Negative for congestion.   Eyes: Positive for photophobia. Negative for blurred vision, pain and visual disturbance.  Respiratory: Negative for shortness of breath.   Cardiovascular: Negative for chest pain and leg swelling.  Gastrointestinal: Negative for nausea.  Musculoskeletal: Positive for arthralgias. Negative for neck pain.  Skin: Negative for rash and wound.  Neurological: Positive for weakness and headaches. Negative for dizziness, numbness and loss of balance.      Allergies  Other  Home Medications   Prior to Admission medications   Medication Sig Start Date End Date Taking? Authorizing Provider  aspirin-acetaminophen-caffeine (EXCEDRIN MIGRAINE) [phone] MG per tablet Take 2 tablets by mouth every 6 (six) hours as needed for headache. 05/13/14  Yes Noland Fordyce, PA-C  cetirizine (ZYRTEC) 10 MG tablet Take 10 mg by mouth daily.    Yes Historical Provider, MD  diphenhydramine-acetaminophen (TYLENOL PM) 25-500 MG TABS Take 2 tablets by mouth as needed (pain/sleep).    Yes Historical Provider, MD  HYDROcodone-acetaminophen (NORCO/VICODIN) 5-325 MG per tablet Take 2 tablets by mouth 2 (two) times daily.   Yes Historical Provider, MD   BP 106/61  Pulse 86  Temp(Src)  98.4 F (36.9 C) (Oral)  Resp 20  SpO2 100%  LMP 05/04/2014 Physical Exam  Nursing note and vitals reviewed. Constitutional: She is oriented to person, place, and time. She appears well-developed and well-nourished. No distress.  HENT:  Head: Normocephalic and atraumatic.  Mouth/Throat: Oropharynx is clear and moist.  Eyes: Pupils are equal, round, and reactive to light.  Neck: Normal range of motion.  Cardiovascular: Normal rate and regular rhythm.   Pulmonary/Chest: Effort normal and breath sounds normal. No respiratory  distress.  Abdominal: Soft.  Musculoskeletal: Normal range of motion. She exhibits no edema and no tenderness.  Full ROM all extremities equal strength   Lymphadenopathy:    She has no cervical adenopathy.  Neurological: She is alert and oriented to person, place, and time.  Skin: Skin is warm. No rash noted.    ED Course  Procedures (including critical care time) Labs Review Labs Reviewed  CBC - Abnormal; Notable for the following:    WBC 11.2 (*)    RBC 2.73 (*)    Hemoglobin 7.8 (*)    HCT 22.8 (*)    RDW 16.4 (*)    Platelets 620 (*)    All other components within normal limits  RETICULOCYTES - Abnormal; Notable for the following:    Retic Ct Pct 10.1 (*)    RBC. 2.73 (*)    Retic Count, Manual 275.7 (*)    All other components within normal limits  I-STAT CHEM 8, ED - Abnormal; Notable for the following:    Potassium 3.5 (*)    Hemoglobin 8.8 (*)    HCT 26.0 (*)    All other components within normal limits  URINALYSIS, ROUTINE W REFLEX MICROSCOPIC    Imaging Review Ct Head Wo Contrast  05/16/2014   HISTORY OF PRESENT ILLNESS: Sickle cell disease, worsening headache.  EXAM: CT HEAD WITHOUT CONTRAST  TECHNIQUE: Contiguous axial images were obtained from the base of the skull through the vertex without intravenous contrast.  COMPARISON:  CT of the head May 13, 2014  FINDINGS: The ventricles and sulci are normal. No intraparenchymal hemorrhage, mass effect nor midline shift. No acute large vascular territory infarcts.  No abnormal extra-axial fluid collections. Basal cisterns are patent.  No skull fracture. The included ocular globes and orbital contents are non-suspicious. The mastoid aircells and included paranasal sinuses are well-aerated. Fullness of the nasopharyngeal soft tissues can be seen with patient's provided young age.  IMPRESSION: No acute intracranial process, stable appearance of the head from May 13, 2014.   Electronically Signed   By: Elon Alas   On:  05/16/2014 04:18     EKG Interpretation None      MDM  Despite multiple doses of Dilantin IV fluids and Toradol patient has persistent consistent with her sickle cell crisis this was discussed with patient who agrees for admission.  Final diagnoses:  Sickle cell pain crisis  Intractable headache, unspecified chronicity pattern, unspecified headache type         Garald Balding, NP 05/16/14 0510

## 2014-05-16 NOTE — Progress Notes (Signed)
[place] PROGRESS NOTE  MARTITA [name] MBW:[id number] DOB: 10/05/1991 DOA: 05/16/2014 PCP: [name], MD  Assessment/Plan: Principal Problem:   Sickle cell crisis Active Problems:   Sickle cell pain crisis   Anemia   Migraine, unspecified, without mention of intractable migraine without mention of status migrainosus  1. Hb SS with crisis: Pt is unable to ratre her pain as she usually has only minimal pain. However she reports that the pain is tolerable after she gives a PCA bolus. She also has better relief with the Toradol. Will continue IVF, PCA at current dose and Toradol.WIll add Percocet scheduled every 6 hours and re-evaluate effectiveness of pain control. 2. Migraine Headache: Headache is classic description of migraine. Will add imitrex and re-eval. Will also obtain MRA vs transcranial doppler to evaluate for risk of stroke in patient with SCD. 3. Sickle Cell Disease: Pt is currently not on Hydrea. I have discussed this with patient and will  initiate Hydrea and Folic acid. However will verify Genotype before initiating therapy.  Code Status: Full Code Family Communication: N/A Disposition Plan: Not yet ready for discharge.  Carmen A.  Pager [phone]. If 7PM-7AM, please contact night-coverage.  05/16/2014, 6:15 PM  LOS: 0 days   Brief narrative: Carmen [name] is a 23 y.o. female with history of sickle cell disease has been experiencing headaches and right-sided body aches for last 3 weeks. Patient states that the right-sided body aches is typical of her sickle cell crisis. She has been having frontal headache with no photophobia fever chills or neck pain on neck rigidity. She states she has had a history of migraine. In the ER CT head did not show anything acute and patient on exam is nonfocal. Patient has been given multiple doses of pain relief medications and at this time has been admitted for further pain management due to sickle cell crisis and  possible migraine. In the ER patient had one episode of nausea vomiting after receiving Dilaudid. Patient was given one dose of Ativan following which patient became very somnolent. Presently patient is more alert and awake. Patient only takes hydrocodone for pain relief at home and is not on any long-acting pain relief medications. Patient denies any chest pain shortness of breath or any focal deficits.   Consultants:  None  Procedures:  None  Antibiotics:  None  HPI/Subjective: Pt is unable to ratre her pain as she usually has only minimal pain. She reports that the pain is localized to the [facility] and RLE. However she reports that the pain is tolerable after she gives a PCA bolus. She also has better relief of headache with the Toradol. Last BM yesterday  Objective: Filed Vitals:   05/16/14 0545 05/16/14 0634 05/16/14 1150 05/16/14 1321  BP: 111/67 118/73  103/54  Pulse: 116 83  105  Temp:  97.7 F (36.5 C)  98.6 F (37 C)  TempSrc:  Oral  Oral  Resp:  18 20 16   Height:  5\' 3"  (1.6 m)    Weight:  198 lb 6.6 oz (90 kg)    SpO2: 100% 95% 100% 100%   Weight change:   Intake/Output Summary (Last 24 hours) at 05/16/14 1815 Last data filed at 05/16/14 1323  Gross per 24 hour  Intake   2240 ml  Output   1000 ml  Net   1240 ml    General: Very pleasant young lady who is alert, awake, oriented x3, in no acute distress.  HEENT: [place]/AT PEERL, EOMI,  anicteric Neck: Trachea midline,  no masses, no thyromegal,y no JVD, no carotid bruit OROPHARYNX:  Moist, No exudate/ erythema/lesions.  Heart: Regular rate and rhythm, without murmurs, rubs, gallops.  Lungs: Clear to auscultation, no wheezing or rhonchi noted.  Abdomen: Soft, nontender, nondistended, positive bowel sounds, no masses no hepatosplenomegaly noted.  Neuro: No focal neurological deficits noted cranial nerves II through XII grossly intact. Strength normal in bilateral upper and lower extremities. Musculoskeletal: No warm  swelling or erythema around joints, no spinal tenderness noted. Psychiatric: Patient alert and oriented x3, good insight and cognition, good recent to remote recall. Lymph node survey: No cervical axillary or inguinal lymphadenopathy noted.   Data Reviewed: Basic Metabolic Panel:  Recent Labs Lab 05/10/14 0845 05/13/14 1818 05/16/14 0222  NA 137 138 143  K 4.2 3.9 3.5*  CL 101 102 108  CO2 24 24  --   GLUCOSE 95 100* 91  BUN 9 8 6   CREATININE 0.58 0.63 0.70  CALCIUM 9.6 9.9  --    Liver Function Tests:  Recent Labs Lab 05/10/14 0845 05/16/14 0533  AST 30 26  ALT 16 15  ALKPHOS 97 86  BILITOT 1.2 1.3*  PROT 8.6* 8.2  ALBUMIN 3.8 3.6   No results found for this basename: LIPASE, AMYLASE,  in the last 168 hours No results found for this basename: AMMONIA,  in the last 168 hours CBC:  Recent Labs Lab 05/10/14 0845 05/13/14 1818 05/16/14 0208 05/16/14 0222  WBC 9.8 12.9* 11.2*  --   NEUTROABS 5.8 7.9* 5.8  --   HGB 9.2* 8.6* 7.8* 8.8*  HCT 26.4* 24.6* 22.8* 26.0*  MCV 83.3 82.6 83.5  --   PLT 718* 606* 620*  --     Studies: Ct Head Wo Contrast  05/16/2014   HISTORY OF PRESENT ILLNESS: Sickle cell disease, worsening headache.  EXAM: CT HEAD WITHOUT CONTRAST  TECHNIQUE: Contiguous axial images were obtained from the base of the skull through the vertex without intravenous contrast.  COMPARISON:  CT of the head May 13, 2014  FINDINGS: The ventricles and sulci are normal. No intraparenchymal hemorrhage, mass effect nor midline shift. No acute large vascular territory infarcts.  No abnormal extra-axial fluid collections. Basal cisterns are patent.  No skull fracture. The included ocular globes and orbital contents are non-suspicious. The mastoid aircells and included paranasal sinuses are well-aerated. Fullness of the nasopharyngeal soft tissues can be seen with patient's provided young age.  IMPRESSION: No acute intracranial process, stable appearance of the head from  May 13, 2014.   Electronically Signed   By: Elon Alas   On: 05/16/2014 04:18   Ct Head Wo Contrast  05/13/2014   CLINICAL DATA:  Four day history of frontal headaches  EXAM: CT HEAD WITHOUT CONTRAST  TECHNIQUE: Contiguous axial images were obtained from the base of the skull through the vertex without intravenous contrast.  COMPARISON:  None.  FINDINGS: The ventricles are normal in size and configuration. The right lateral ventricle is slightly larger than the left lateral ventricle, an anatomic variant. There is no mass, hemorrhage, extra-axial fluid collection, or midline shift. Gray-white compartments appear normal. No demonstrable acute infarct. Bony calvarium appears intact. The mastoid air cells are clear.  IMPRESSION: Study within normal limits.   Electronically Signed   By: Lowella Grip M.D.   On: 05/13/2014 18:59    Scheduled Meds: . docusate sodium  100 mg Oral BID  . enoxaparin (LOVENOX) injection  40 mg Subcutaneous Q24H  .  folic acid  1 mg Oral Daily  . HYDROcodone-acetaminophen  1 tablet Oral 4 times per day  . HYDROmorphone PCA 0.3 mg/mL   Intravenous 6 times per day  . ketorolac  30 mg Intravenous 4 times per day  . loratadine  10 mg Oral Daily   Continuous Infusions: . sodium chloride 125 mL/hr at 05/16/14 0638    Total time 45 minutes.

## 2014-05-17 LAB — LACTATE DEHYDROGENASE: LDH: 338 U/L — ABNORMAL HIGH (ref 94–250)

## 2014-05-17 LAB — CBC WITH DIFFERENTIAL/PLATELET
Basophils Absolute: 0 10*3/uL (ref 0.0–0.1)
Basophils Relative: 0 % (ref 0–1)
Eosinophils Absolute: 0.3 10*3/uL (ref 0.0–0.7)
Eosinophils Relative: 2 % (ref 0–5)
HCT: 18.3 % — ABNORMAL LOW (ref 36.0–46.0)
Hemoglobin: 6.6 g/dL — CL (ref 12.0–15.0)
Lymphocytes Relative: 24 % (ref 12–46)
Lymphs Abs: 3.3 10*3/uL (ref 0.7–4.0)
MCH: 28.6 pg (ref 26.0–34.0)
MCHC: 36.1 g/dL — AB (ref 30.0–36.0)
MCV: 79.2 fL (ref 78.0–100.0)
MONOS PCT: 7 % (ref 3–12)
Monocytes Absolute: 1 10*3/uL (ref 0.1–1.0)
Neutro Abs: 9.1 10*3/uL — ABNORMAL HIGH (ref 1.7–7.7)
Neutrophils Relative %: 67 % (ref 43–77)
PLATELETS: 478 10*3/uL — AB (ref 150–400)
RBC: 2.31 MIL/uL — ABNORMAL LOW (ref 3.87–5.11)
RDW: 18.7 % — AB (ref 11.5–15.5)
WBC: 13.7 10*3/uL — AB (ref 4.0–10.5)

## 2014-05-17 MED ORDER — HYDROCODONE-ACETAMINOPHEN 5-325 MG PO TABS
1.0000 | ORAL_TABLET | ORAL | Status: DC
  Administered 2014-05-17 – 2014-05-18 (×4): 1 via ORAL
  Filled 2014-05-17 (×5): qty 1

## 2014-05-17 NOTE — ED Provider Notes (Signed)
Medical screening examination/treatment/procedure(s) were performed by non-physician practitioner and as supervising physician I was immediately available for consultation/collaboration.    Johnna Acosta, MD 05/17/14 [phone]

## 2014-05-17 NOTE — Progress Notes (Signed)
[name]  MIRIANA [name] JSE:[id number] DOB: 1991/04/19 DOA: 05/16/2014 PCP: [name], MD  Assessment/Plan: Principal Problem:   Sickle cell crisis Active Problems:   Sickle cell pain crisis   Anemia   Migraine, unspecified, without mention of intractable migraine without mention of status migrainosus  1. Hb SS with crisis: Pain is improved and is present only in RLE.  However she reports that the pain is tolerable after she gives a PCA bolus. She also has better relief with the Toradol. Will continue IVF, PCA at current dose and Toradol.WIll change frequency of Percocet to every 4 hours in anticipation of discharge home. 2. Migraine Headache: Headache is classic description of migraine. Improved with Toradol 3. Sickle Cell Disease: Pt is currently not on Hydrea. I have discussed this with patient and will  initiate Hydrea and Folic acid. However will verify Genotype before initiating therapy with Hydrea. Continue Folic Acid.  Code Status: Full Code Family Communication: N/A Disposition Plan: Not yet ready for discharge.  Carmen [name] A.  Pager [phone]. If 7PM-7AM, please contact night-coverage.  05/17/2014, 1:55 PM  LOS: 1 day   Brief narrative: Carmen [name] is a 23 y.o. female with history of sickle cell disease has been experiencing headaches and right-sided body aches for last 3 weeks. Patient states that the right-sided body aches is typical of her sickle cell crisis. She has been having frontal headache with no photophobia fever chills or neck pain on neck rigidity. She states she has had a history of migraine. In the ER CT head did not show anything acute and patient on exam is nonfocal. Patient has been given multiple doses of pain relief medications and at this time has been admitted for further pain management due to sickle cell crisis and possible migraine. In the ER patient had one episode of nausea vomiting after receiving Dilaudid.  Patient was given one dose of Ativan following which patient became very somnolent. Presently patient is more alert and awake. Patient only takes hydrocodone for pain relief at home and is not on any long-acting pain relief medications. Patient denies any chest pain shortness of breath or any focal deficits.   Consultants:  None  Procedures:  None  Antibiotics:  None  HPI/Subjective: Pt is unable to ratre her pain as she usually has only minimal pain. She reports that the pain is localized only to the RLE now. However she reports that the pain is improved  after she gives a PCA bolus. She also has better relief of headache with the Toradol. Last BM yesterday  Objective: Filed Vitals:   05/16/14 2352 05/17/14 0457 05/17/14 0509 05/17/14 1007  BP:  114/63  106/61  Pulse:  93  105  Temp:  98.5 F (36.9 C)  98.3 F (36.8 C)  TempSrc:  Oral  Oral  Resp: 18 20 19 24   Height:      Weight:      SpO2: 99% 99% 100% 94%   Weight change:   Intake/Output Summary (Last 24 hours) at 05/17/14 1355 Last data filed at 05/17/14 1012  Gross per 24 hour  Intake 3280.83 ml  Output   1050 ml  Net 2230.83 ml    General: Very pleasant young lady who is alert, awake, oriented x3, in no acute distress.  HEENT: [place]/AT PEERL, EOMI, anicteric Neck: Trachea midline,  no masses, no thyromegal,y no JVD, no carotid bruit OROPHARYNX:  Moist, No exudate/ erythema/lesions.  Heart: Regular rate and rhythm, without murmurs,  rubs, gallops.  Lungs: Clear to auscultation, no wheezing or rhonchi noted.  Abdomen: Soft, nontender, nondistended, positive bowel sounds, no masses no hepatosplenomegaly noted.  Neuro: No focal neurological deficits noted cranial nerves II through XII grossly intact. Strength normal in bilateral upper and lower extremities. Musculoskeletal: No warm swelling or erythema around joints, no spinal tenderness noted. Psychiatric: Patient alert and oriented x3, good insight and cognition,  good recent to remote recall. Lymph node survey: No cervical axillary or inguinal lymphadenopathy noted.   Data Reviewed: Basic Metabolic Panel:  Recent Labs Lab 05/13/14 1818 05/16/14 0222  NA 138 143  K 3.9 3.5*  CL 102 108  CO2 24  --   GLUCOSE 100* 91  BUN 8 6  CREATININE 0.63 0.70  CALCIUM 9.9  --    Liver Function Tests:  Recent Labs Lab 05/16/14 0533  AST 26  ALT 15  ALKPHOS 86  BILITOT 1.3*  PROT 8.2  ALBUMIN 3.6   No results found for this basename: LIPASE, AMYLASE,  in the last 168 hours No results found for this basename: AMMONIA,  in the last 168 hours CBC:  Recent Labs Lab 05/13/14 1818 05/16/14 0208 05/16/14 0222 05/17/14 0939  WBC 12.9* 11.2*  --  13.7*  NEUTROABS 7.9* 5.8  --  9.1*  HGB 8.6* 7.8* 8.8* 6.6*  HCT 24.6* 22.8* 26.0* 18.3*  MCV 82.6 83.5  --  79.2  PLT 606* 620*  --  478*    Studies: Ct Head Wo Contrast  05/16/2014   HISTORY OF PRESENT ILLNESS: Sickle cell disease, worsening headache.  EXAM: CT HEAD WITHOUT CONTRAST  TECHNIQUE: Contiguous axial images were obtained from the base of the skull through the vertex without intravenous contrast.  COMPARISON:  CT of the head May 13, 2014  FINDINGS: The ventricles and sulci are normal. No intraparenchymal hemorrhage, mass effect nor midline shift. No acute large vascular territory infarcts.  No abnormal extra-axial fluid collections. Basal cisterns are patent.  No skull fracture. The included ocular globes and orbital contents are non-suspicious. The mastoid aircells and included paranasal sinuses are well-aerated. Fullness of the nasopharyngeal soft tissues can be seen with patient's provided young age.  IMPRESSION: No acute intracranial process, stable appearance of the head from May 13, 2014.   Electronically Signed   By: Elon Alas   On: 05/16/2014 04:18   Ct Head Wo Contrast  05/13/2014   CLINICAL DATA:  Four day history of frontal headaches  EXAM: CT HEAD WITHOUT CONTRAST   TECHNIQUE: Contiguous axial images were obtained from the base of the skull through the vertex without intravenous contrast.  COMPARISON:  None.  FINDINGS: The ventricles are normal in size and configuration. The right lateral ventricle is slightly larger than the left lateral ventricle, an anatomic variant. There is no mass, hemorrhage, extra-axial fluid collection, or midline shift. Gray-white compartments appear normal. No demonstrable acute infarct. Bony calvarium appears intact. The mastoid air cells are clear.  IMPRESSION: Study within normal limits.   Electronically Signed   By: Lowella Grip M.D.   On: 05/13/2014 18:59    Scheduled Meds: . docusate sodium  100 mg Oral BID  . enoxaparin (LOVENOX) injection  40 mg Subcutaneous Q24H  . folic acid  1 mg Oral Daily  . HYDROcodone-acetaminophen  1 tablet Oral Q4H  . HYDROmorphone PCA 0.3 mg/mL   Intravenous 6 times per day  . ketorolac  30 mg Intravenous 4 times per day  . loratadine  10 mg  Oral Daily   Continuous Infusions:    Total time 40 minutes.

## 2014-05-18 ENCOUNTER — Other Ambulatory Visit: Payer: Self-pay | Admitting: Internal Medicine

## 2014-05-18 LAB — CBC WITH DIFFERENTIAL/PLATELET
BASOS ABS: 0 10*3/uL (ref 0.0–0.1)
Basophils Relative: 0 % (ref 0–1)
EOS ABS: 0.2 10*3/uL (ref 0.0–0.7)
EOS PCT: 2 % (ref 0–5)
HCT: 16 % — ABNORMAL LOW (ref 36.0–46.0)
Hemoglobin: 5.8 g/dL — CL (ref 12.0–15.0)
Lymphocytes Relative: 26 % (ref 12–46)
Lymphs Abs: 3.2 10*3/uL (ref 0.7–4.0)
MCH: 29 pg (ref 26.0–34.0)
MCHC: 36.3 g/dL — AB (ref 30.0–36.0)
MCV: 80 fL (ref 78.0–100.0)
MONO ABS: 0.9 10*3/uL (ref 0.1–1.0)
Monocytes Relative: 7 % (ref 3–12)
NEUTROS PCT: 65 % (ref 43–77)
Neutro Abs: 8 10*3/uL — ABNORMAL HIGH (ref 1.7–7.7)
Platelets: 456 10*3/uL — ABNORMAL HIGH (ref 150–400)
RBC: 2 MIL/uL — AB (ref 3.87–5.11)
RDW: 19.3 % — ABNORMAL HIGH (ref 11.5–15.5)
WBC MORPHOLOGY: INCREASED
WBC: 12.3 10*3/uL — ABNORMAL HIGH (ref 4.0–10.5)

## 2014-05-18 LAB — ABO/RH: ABO/RH(D): O POS

## 2014-05-18 LAB — PREPARE RBC (CROSSMATCH)

## 2014-05-18 LAB — LACTATE DEHYDROGENASE: LDH: 303 U/L — ABNORMAL HIGH (ref 94–250)

## 2014-05-18 MED ORDER — FOLIC ACID 1 MG PO TABS: 1.0000 mg | ORAL_TABLET | Freq: Every day | ORAL | Status: DC

## 2014-05-18 NOTE — Discharge Summary (Signed)
Carmen [name] MRN: [id number] DOB/AGE: [birth year] 23 y.o.  Admit date: 05/16/2014 Discharge date: 05/18/2014  Primary Care Physician:  [name], MD   Discharge Diagnoses:   Patient Active Problem List   Diagnosis Date Noted  . Anemia 05/16/2014  . Migraine, unspecified, without mention of intractable migraine without mention of status migrainosus 05/16/2014  . Sickle cell pain crisis 11/12/2013  . Sickle cell crisis 11/12/2013    DISCHARGE MEDICATION:   Medication List         aspirin-acetaminophen-caffeine 833-825-05 MG per tablet  Commonly known as:  EXCEDRIN MIGRAINE  Take 2 tablets by mouth every 6 (six) hours as needed for headache.     cetirizine 10 MG tablet  Commonly known as:  ZYRTEC  Take 10 mg by mouth daily.     diphenhydramine-acetaminophen 25-500 MG Tabs  Commonly known as:  TYLENOL PM  Take 2 tablets by mouth as needed (pain/sleep).     folic acid 1 MG tablet  Commonly known as:  FOLVITE  Take 1 tablet (1 mg total) by mouth daily.     HYDROcodone-acetaminophen 5-325 MG per tablet  Commonly known as:  NORCO/VICODIN  Take 2 tablets by mouth 2 (two) times daily.          Consults:     SIGNIFICANT DIAGNOSTIC STUDIES:  Ct Head Wo Contrast  05/16/2014   HISTORY OF PRESENT ILLNESS: Sickle cell disease, worsening headache.  EXAM: CT HEAD WITHOUT CONTRAST  TECHNIQUE: Contiguous axial images were obtained from the base of the skull through the vertex without intravenous contrast.  COMPARISON:  CT of the head May 13, 2014  FINDINGS: The ventricles and sulci are normal. No intraparenchymal hemorrhage, mass effect nor midline shift. No acute large vascular territory infarcts.  No abnormal extra-axial fluid collections. Basal cisterns are patent.  No skull fracture. The included ocular globes and orbital contents are non-suspicious. The mastoid aircells and included paranasal sinuses are well-aerated. Fullness of the nasopharyngeal soft tissues can be  seen with patient's provided young age.  IMPRESSION: No acute intracranial process, stable appearance of the head from May 13, 2014.   Electronically Signed   By: Elon Alas   On: 05/16/2014 04:18   Ct Head Wo Contrast  05/13/2014   CLINICAL DATA:  Four day history of frontal headaches  EXAM: CT HEAD WITHOUT CONTRAST  TECHNIQUE: Contiguous axial images were obtained from the base of the skull through the vertex without intravenous contrast.  COMPARISON:  None.  FINDINGS: The ventricles are normal in size and configuration. The right lateral ventricle is slightly larger than the left lateral ventricle, an anatomic variant. There is no mass, hemorrhage, extra-axial fluid collection, or midline shift. Gray-white compartments appear normal. No demonstrable acute infarct. Bony calvarium appears intact. The mastoid air cells are clear.  IMPRESSION: Study within normal limits.   Electronically Signed   By: Lowella Grip M.D.   On: 05/13/2014 18:59     No results found for this or any previous visit (from the past 240 hour(s)).  BRIEF ADMITTING H & P: [name] is a 23 y.o. female with history of sickle cell disease has been experiencing headaches and right-sided body aches for last 3 weeks. Patient states that the right-sided body aches is typical of her sickle cell crisis. She has been having frontal headache with no photophobia fever chills or neck pain on neck rigidity. She states she has had a history of migraine. In the ER CT head did not show  anything acute and patient on exam is nonfocal. Patient has been given multiple doses of pain relief medications and at this time has been admitted for further pain management due to sickle cell crisis and possible migraine. In the ER patient had one episode of nausea vomiting after receiving Dilaudid. Patient was given one dose of Ativan following which patient became very somnolent. Presently patient is more alert and awake. Patient only takes  hydrocodone for pain relief at home and is not on any long-acting pain relief medications. Patient denies any chest pain shortness of breath or any focal deficits.    Hospital Course:  Present on Admission:  . Sickle cell crisis: . Anemia: Pt is T&C for blood. She will follow up at the [facility] for CBC with diff on Monday. If necessary, she will receive transfusion in the out patient setting. . Migraine Headaches: Pt complained of migraine headaches which were made worse with   Disposition and Follow-up:  Pt is discharged in stable condition. She is to follow up at the [facility] [place] for labs (CBC with diff) on Monday and a decision will be made at that time by me about need for transfusion.     Discharge Instructions   Activity as tolerated - No restrictions    Complete by:  As directed      Diet general    Complete by:  As directed            DISCHARGE EXAM:  General: Very pleasant young lady who is alert, awake, oriented x3, in no acute distress. Well appearing. States that she feels well and up ambulating without difficulty. Vital Signs:BP 101/69, HR 91, T 98 F (36.7 C), temperature source Oral, RR 16, height 5\' 3"  (1.6 m), weight 198 lb 6.6 oz (90 kg), last menstrual period 05/04/2014, SpO2 98.00%. HEENT: [place]/AT PEERL, EOMI, mild icterus Neck: Trachea midline, no masses, no thyromegal,y no JVD, no carotid bruit  OROPHARYNX: Moist, No exudate/ erythema/lesions.  Heart: Regular rate and rhythm, without murmurs, rubs, gallops.  Lungs: Clear to auscultation, no wheezing or rhonchi noted.  Abdomen: Soft, nontender, nondistended, positive bowel sounds, no masses no hepatosplenomegaly noted.  Neuro: No focal neurological deficits noted cranial nerves II through XII grossly intact. Strength normal in bilateral upper and lower extremities.  Musculoskeletal: No warm swelling or erythema around joints, no spinal tenderness noted.  Psychiatric: Patient  alert and oriented x3, good insight and cognition, good recent to remote recall.  Lymph node survey: No cervical axillary or inguinal lymphadenopathy noted    Recent Labs  05/16/14 0222  NA 143  K 3.5*  CL 108  GLUCOSE 91  BUN 6  CREATININE 0.70    Recent Labs  05/16/14 0533  AST 26  ALT 15  ALKPHOS 86  BILITOT 1.3*  PROT 8.2  ALBUMIN 3.6   No results found for this basename: LIPASE, AMYLASE,  in the last 72 hours  Recent Labs  05/17/14 0939 05/18/14 0440  WBC 13.7* 12.3*  NEUTROABS 9.1* 8.0*  HGB 6.6* 5.8*  HCT 18.3* 16.0*  MCV 79.2 80.0  PLT 478* 456*   Total time spent in decision making and face to face was greater than 30 minutes Signed: MATTHEWS,MICHELLE A. 05/18/2014, 10:56 AM

## 2014-05-18 NOTE — Progress Notes (Signed)
Patient ID: Carmen [name], female   DOB: 09-02-1991, 22 y.o.   MRN: [id number] Pt discharged from hospital on Saturday 05/18/2014. She is to report to the [facility] for labs and possible transfusion on Monday 05/20/2014.

## 2014-05-18 NOTE — Progress Notes (Signed)
Discharge to home, family at bedside.  D/c instructiona and follow uo appointments done and was given to the patient. Blue blood bank on with the patient per MD's order.

## 2014-05-20 LAB — HEMOGLOBINOPATHY EVALUATION
HGB A2 QUANT: 2.9 % (ref 2.2–3.2)
HGB A: 0 % — AB (ref 96.8–97.8)
HGB F QUANT: 18.2 % — AB (ref 0.0–2.0)
Hemoglobin Other: 0 %
Hgb S Quant: 78.9 % — ABNORMAL HIGH

## 2014-05-21 ENCOUNTER — Other Ambulatory Visit: Payer: Self-pay | Admitting: Internal Medicine

## 2014-05-22 LAB — TYPE AND SCREEN
ABO/RH(D): O POS
Antibody Screen: NEGATIVE
Donor AG Type: NEGATIVE
UNIT DIVISION: 0

## 2014-09-02 ENCOUNTER — Encounter (HOSPITAL_COMMUNITY): Payer: Self-pay | Admitting: Emergency Medicine

## 2014-09-11 DIAGNOSIS — R011 Cardiac murmur, unspecified: Secondary | ICD-10-CM | POA: Insufficient documentation

## 2014-11-22 ENCOUNTER — Encounter (HOSPITAL_COMMUNITY): Payer: Self-pay | Admitting: Emergency Medicine

## 2014-11-22 ENCOUNTER — Inpatient Hospital Stay (HOSPITAL_COMMUNITY)
Admission: EM | Admit: 2014-11-22 | Discharge: 2014-11-24 | DRG: 812 | Disposition: A | Discharge status: Home / Self Care | Payer: Medicaid Other | Attending: Internal Medicine | Admitting: Internal Medicine

## 2014-11-22 DIAGNOSIS — D473 Essential (hemorrhagic) thrombocythemia: Secondary | ICD-10-CM | POA: Diagnosis present

## 2014-11-22 DIAGNOSIS — Z3202 Encounter for pregnancy test, result negative: Secondary | ICD-10-CM | POA: Diagnosis not present

## 2014-11-22 DIAGNOSIS — Z832 Family history of diseases of the blood and blood-forming organs and certain disorders involving the immune mechanism: Secondary | ICD-10-CM | POA: Diagnosis not present

## 2014-11-22 DIAGNOSIS — D5700 Hb-SS disease with crisis, unspecified: Secondary | ICD-10-CM | POA: Diagnosis present

## 2014-11-22 DIAGNOSIS — R0600 Dyspnea, unspecified: Secondary | ICD-10-CM | POA: Diagnosis not present

## 2014-11-22 DIAGNOSIS — Z8249 Family history of ischemic heart disease and other diseases of the circulatory system: Secondary | ICD-10-CM | POA: Diagnosis not present

## 2014-11-22 DIAGNOSIS — Z8659 Personal history of other mental and behavioral disorders: Secondary | ICD-10-CM | POA: Diagnosis not present

## 2014-11-22 DIAGNOSIS — Z79891 Long term (current) use of opiate analgesic: Secondary | ICD-10-CM | POA: Diagnosis not present

## 2014-11-22 DIAGNOSIS — Z23 Encounter for immunization: Secondary | ICD-10-CM | POA: Diagnosis not present

## 2014-11-22 DIAGNOSIS — D75839 Thrombocytosis, unspecified: Secondary | ICD-10-CM

## 2014-11-22 DIAGNOSIS — D72829 Elevated white blood cell count, unspecified: Secondary | ICD-10-CM | POA: Diagnosis present

## 2014-11-22 DIAGNOSIS — Z9081 Acquired absence of spleen: Secondary | ICD-10-CM | POA: Diagnosis present

## 2014-11-22 DIAGNOSIS — M549 Dorsalgia, unspecified: Secondary | ICD-10-CM | POA: Diagnosis not present

## 2014-11-22 DIAGNOSIS — Z79899 Other long term (current) drug therapy: Secondary | ICD-10-CM | POA: Diagnosis not present

## 2014-11-22 HISTORY — DX: Thrombocytosis, unspecified: D75.839

## 2014-11-22 LAB — I-STAT CHEM 8, ED
BUN: 7 mg/dL (ref 6–23)
CHLORIDE: 107 mmol/L (ref 96–112)
Calcium, Ion: 1.22 mmol/L (ref 1.12–1.23)
Creatinine, Ser: 0.7 mg/dL (ref 0.50–1.10)
GLUCOSE: 91 mg/dL (ref 70–99)
HCT: 29 % — ABNORMAL LOW (ref 36.0–46.0)
HEMOGLOBIN: 9.9 g/dL — AB (ref 12.0–15.0)
POTASSIUM: 3.8 mmol/L (ref 3.5–5.1)
Sodium: 140 mmol/L (ref 135–145)
TCO2: 20 mmol/L (ref 0–100)

## 2014-11-22 LAB — COMPREHENSIVE METABOLIC PANEL
ALT: 40 U/L — AB (ref 0–35)
AST: 48 U/L — AB (ref 0–37)
Albumin: 4.8 g/dL (ref 3.5–5.2)
Alkaline Phosphatase: 62 U/L (ref 39–117)
Anion gap: 12 (ref 5–15)
BUN: 8 mg/dL (ref 6–23)
CALCIUM: 9.6 mg/dL (ref 8.4–10.5)
CO2: 20 mmol/L (ref 19–32)
Chloride: 105 mmol/L (ref 96–112)
Creatinine, Ser: 0.78 mg/dL (ref 0.50–1.10)
GFR calc Af Amer: >90 mL/min (ref >90)
Glucose, Bld: 89 mg/dL (ref 70–99)
Potassium: 3.5 mmol/L (ref 3.5–5.1)
SODIUM: 137 mmol/L (ref 135–145)
TOTAL PROTEIN: 8.7 g/dL — AB (ref 6.0–8.3)
Total Bilirubin: 1.9 mg/dL — ABNORMAL HIGH (ref 0.3–1.2)

## 2014-11-22 LAB — CBC WITH DIFFERENTIAL/PLATELET
BASOS PCT: 0 % (ref 0–1)
Basophils Absolute: 0 10*3/uL (ref 0.0–0.1)
EOS ABS: 0.1 10*3/uL (ref 0.0–0.7)
EOS PCT: 1 % (ref 0–5)
HCT: 25.9 % — ABNORMAL LOW (ref 36.0–46.0)
Hemoglobin: 9.3 g/dL — ABNORMAL LOW (ref 12.0–15.0)
Lymphocytes Relative: 23 % (ref 12–46)
Lymphs Abs: 2.8 10*3/uL (ref 0.7–4.0)
MCH: 29.5 pg (ref 26.0–34.0)
MCHC: 35.9 g/dL (ref 30.0–36.0)
MCV: 82.2 fL (ref 78.0–100.0)
MONOS PCT: 10 % (ref 3–12)
Monocytes Absolute: 1.2 10*3/uL — ABNORMAL HIGH (ref 0.1–1.0)
NEUTROS ABS: 7.8 10*3/uL — AB (ref 1.7–7.7)
Neutrophils Relative %: 66 % (ref 43–77)
Platelets: 592 10*3/uL — ABNORMAL HIGH (ref 150–400)
RBC: 3.15 MIL/uL — ABNORMAL LOW (ref 3.87–5.11)
RDW: 15.5 % (ref 11.5–15.5)
WBC: 11.9 10*3/uL — ABNORMAL HIGH (ref 4.0–10.5)

## 2014-11-22 LAB — RETICULOCYTES
RBC.: 3.15 MIL/uL — AB (ref 3.87–5.11)
RETIC COUNT ABSOLUTE: 201.6 10*3/uL — AB (ref 19.0–186.0)
Retic Ct Pct: 6.4 % — ABNORMAL HIGH (ref 0.4–3.1)

## 2014-11-22 MED ORDER — NALOXONE HCL 0.4 MG/ML IJ SOLN: 0.4000 mg | INTRAMUSCULAR | Status: DC | PRN

## 2014-11-22 MED ORDER — ONDANSETRON HCL 4 MG/2ML IJ SOLN: 4.0000 mg | Freq: Three times a day (TID) | INTRAMUSCULAR | Status: DC | PRN

## 2014-11-22 MED ORDER — HYDROMORPHONE 0.3 MG/ML IV SOLN
INTRAVENOUS | Status: DC
  Administered 2014-11-23: 2.59 mg via INTRAVENOUS
  Administered 2014-11-23: 1.09 mg via INTRAVENOUS
  Administered 2014-11-23: 1.39 mg via INTRAVENOUS
  Administered 2014-11-23: 23:00:00 via INTRAVENOUS
  Administered 2014-11-23: 0.4 mg via INTRAVENOUS
  Administered 2014-11-23: 0.599 mg via INTRAVENOUS
  Administered 2014-11-23: 0.3 mg via INTRAVENOUS
  Administered 2014-11-24: 1.99 mg via INTRAVENOUS
  Administered 2014-11-24: 1.39 mg via INTRAVENOUS
  Administered 2014-11-24: 1.19 mg via INTRAVENOUS
  Administered 2014-11-24: 0.4 mg via INTRAVENOUS
  Filled 2014-11-22 (×2): qty 25

## 2014-11-22 MED ORDER — PROMETHAZINE HCL 25 MG PO TABS
12.5000 mg | ORAL_TABLET | ORAL | Status: DC | PRN
  Filled 2014-11-22: qty 1

## 2014-11-22 MED ORDER — HYDROXYUREA 500 MG PO CAPS
500.0000 mg | ORAL_CAPSULE | Freq: Two times a day (BID) | ORAL | Status: DC
  Administered 2014-11-23 – 2014-11-24 (×4): 500 mg via ORAL
  Filled 2014-11-22 (×6): qty 1

## 2014-11-22 MED ORDER — FENTANYL CITRATE 0.05 MG/ML IJ SOLN
100.0000 ug | Freq: Once | INTRAMUSCULAR | Status: AC
  Administered 2014-11-22: 100 ug via INTRAVENOUS
  Filled 2014-11-22: qty 2

## 2014-11-22 MED ORDER — SODIUM CHLORIDE 0.9 % IJ SOLN: 9.0000 mL | INTRAMUSCULAR | Status: DC | PRN

## 2014-11-22 MED ORDER — DIPHENHYDRAMINE HCL 12.5 MG/5ML PO ELIX: 12.5000 mg | ORAL_SOLUTION | Freq: Four times a day (QID) | ORAL | Status: DC | PRN

## 2014-11-22 MED ORDER — FOLIC ACID 1 MG PO TABS
1.0000 mg | ORAL_TABLET | Freq: Every day | ORAL | Status: DC
  Administered 2014-11-23 – 2014-11-24 (×2): 1 mg via ORAL
  Filled 2014-11-22 (×2): qty 1

## 2014-11-22 MED ORDER — DEXTROSE-NACL 5-0.45 % IV SOLN
INTRAVENOUS | Status: DC
  Administered 2014-11-23 (×3): via INTRAVENOUS

## 2014-11-22 MED ORDER — SODIUM CHLORIDE 0.9 % IV SOLN: INTRAVENOUS | Status: DC

## 2014-11-22 MED ORDER — SENNOSIDES-DOCUSATE SODIUM 8.6-50 MG PO TABS
1.0000 | ORAL_TABLET | Freq: Two times a day (BID) | ORAL | Status: DC
  Administered 2014-11-23 – 2014-11-24 (×4): 1 via ORAL
  Filled 2014-11-22 (×5): qty 1

## 2014-11-22 MED ORDER — LORATADINE 10 MG PO TABS
10.0000 mg | ORAL_TABLET | Freq: Every day | ORAL | Status: DC
  Administered 2014-11-23 – 2014-11-24 (×2): 10 mg via ORAL
  Filled 2014-11-22 (×2): qty 1

## 2014-11-22 MED ORDER — HYDROMORPHONE HCL 2 MG/ML IJ SOLN
2.0000 mg | Freq: Once | INTRAMUSCULAR | Status: AC
  Administered 2014-11-22: 2 mg via INTRAVENOUS
  Filled 2014-11-22: qty 1

## 2014-11-22 MED ORDER — POLYETHYLENE GLYCOL 3350 17 G PO PACK: 17.0000 g | PACK | Freq: Every day | ORAL | Status: DC | PRN

## 2014-11-22 MED ORDER — PROMETHAZINE HCL 25 MG RE SUPP
12.5000 mg | RECTAL | Status: DC | PRN
  Administered 2014-11-22 – 2014-11-23 (×2): 25 mg via RECTAL
  Filled 2014-11-22 (×2): qty 1

## 2014-11-22 MED ORDER — HYDROMORPHONE HCL 1 MG/ML IJ SOLN: 1.0000 mg | INTRAMUSCULAR | Status: DC | PRN

## 2014-11-22 MED ORDER — DIPHENHYDRAMINE HCL 50 MG/ML IJ SOLN: 12.5000 mg | Freq: Four times a day (QID) | INTRAMUSCULAR | Status: DC | PRN

## 2014-11-22 MED ORDER — SODIUM CHLORIDE 0.9 % IV BOLUS (SEPSIS)
1000.0000 mL | Freq: Once | INTRAVENOUS | Status: AC
Start: 2014-11-22 — End: 2014-11-22
  Administered 2014-11-22: 1000 mL via INTRAVENOUS

## 2014-11-22 MED ORDER — HYDROMORPHONE HCL 2 MG/ML IJ SOLN
2.0000 mg | INTRAMUSCULAR | Status: DC | PRN
Start: 2014-11-22 — End: 2014-11-24
  Administered 2014-11-22 (×2): 2 mg via INTRAVENOUS
  Filled 2014-11-22 (×3): qty 1

## 2014-11-22 MED ORDER — ENOXAPARIN SODIUM 40 MG/0.4ML ~~LOC~~ SOLN
40.0000 mg | Freq: Every day | SUBCUTANEOUS | Status: DC
  Administered 2014-11-23 (×2): 40 mg via SUBCUTANEOUS
  Filled 2014-11-22 (×3): qty 0.4

## 2014-11-22 MED ORDER — KETOROLAC TROMETHAMINE 15 MG/ML IJ SOLN
15.0000 mg | Freq: Four times a day (QID) | INTRAMUSCULAR | Status: DC
  Administered 2014-11-23 (×2): 15 mg via INTRAVENOUS
  Filled 2014-11-22 (×6): qty 1

## 2014-11-22 NOTE — ED Notes (Signed)
Report given to floor All questions answered by this nurse Patient transported to floor by Asencion Partridge, ED tech Patient in NAD upon leaving ED

## 2014-11-22 NOTE — ED Notes (Addendum)
Patient is now asleep while this nurse was finishing previous note Patient resting in position of comfort with eyes closed RR WNL--even and unlabored with equal rise and fall of chest Patient in NAD Side rails up, call bell in reach

## 2014-11-22 NOTE — ED Notes (Signed)
Dr. Arnoldo Morale here in ED Per Dr. Arnoldo Morale, patient is Med-Surg and dose not need to be on tele Order for tele discontinued  Will make floor staff aware

## 2014-11-22 NOTE — ED Notes (Addendum)
Patient states that previously given doses of Dilaudid has not relieved pain  Patient given additional dose of Dilaudid per EDP orders, see MAR VS updated and stable RR WNL--even and unlabored with equal rise and fall of chest Patient in NAD Side rails up, call bell in reach  Patient denies further needs at this time Side rails up, call bell in reach

## 2014-11-22 NOTE — ED Notes (Signed)
Patient on 2L [place] since arrival to ED with O2 sat 100% Patient asking for [place] to be increased so she can "feel that it is working:--O2 set to 4L  Patient resting in position of comfort RR WNL--even and unlabored with equal rise and fall of chest Patient in NAD Side rails up, call bell in reach

## 2014-11-22 NOTE — ED Notes (Signed)
Patient resting in position of comfort with eyes closed RR WNL--even and unlabored with equal rise and fall of chest Patient in NAD Side rails up, call bell in reach  

## 2014-11-22 NOTE — ED Provider Notes (Signed)
CSN: [id number]     Arrival date & time 11/22/14  1735 History   First MD Initiated Contact with Patient 11/22/14 1745     Chief Complaint  Patient presents with  . Back Pain  . Sickle Cell Pain Crisis     (Consider location/radiation/quality/duration/timing/severity/associated sxs/prior Treatment) HPI Comments: Patient is a 24 year old female with history of sickle cell disease. She presents with complaints of pain in her thoracic and lumbar spine that is consistent with her prior crises. She denies any injury or trauma. She denies any radiation to her legs. She denies any chest pain, difficulty breathing, fever. She states that she usually has flareups of this when it's cold outside. She has not been to the emergency department since July 2015.  Patient is a 24 y.o. female presenting with back pain and sickle cell pain. The history is provided by the patient.  Back Pain Location:  Thoracic spine and lumbar spine Quality:  Stabbing Radiates to:  Does not radiate Pain severity:  Severe Pain is:  Same all the time Onset quality:  Gradual Duration:  12 hours Timing:  Constant Progression:  Worsening Chronicity:  New Context: not falling   Relieved by:  Nothing Worsened by:  Nothing tried Sickle Cell Pain Crisis   Past Medical History  Diagnosis Date  . Sickle cell crisis   . Depression   . Headache(784.0)   . Abortion     05/2012   Past Surgical History  Procedure Laterality Date  . Splenectomy    . Splenectomy, total     Family History  Problem Relation Age of Onset  . Hypertension Mother   . Sickle cell anemia Sister   . Kidney disease Sister   . Arthritis Sister   . Sickle cell anemia Sister   . Sickle cell trait Sister   . Heart disease Maternal Aunt   . Heart disease Maternal Uncle    History  Substance Use Topics  . Smoking status: Never Smoker   . Smokeless tobacco: Never Used  . Alcohol Use: No   OB History    Gravida Para Term Preterm AB TAB SAB  Ectopic Multiple Living   1    1          Review of Systems  Musculoskeletal: Positive for back pain.  All other systems reviewed and are negative.     Allergies  Other  Home Medications   Prior to Admission medications   Medication Sig Start Date End Date Taking? Authorizing Provider  aspirin-acetaminophen-caffeine (EXCEDRIN MIGRAINE) [phone] MG per tablet Take 2 tablets by mouth every 6 (six) hours as needed for headache. 05/13/14   Noland Fordyce, PA-C  cetirizine (ZYRTEC) 10 MG tablet Take 10 mg by mouth daily.     Historical Provider, MD  diphenhydramine-acetaminophen (TYLENOL PM) 25-500 MG TABS Take 2 tablets by mouth as needed (pain/sleep).     Historical Provider, MD  folic acid (FOLVITE) 1 MG tablet Take 1 tablet (1 mg total) by mouth daily. 05/18/14   Leana Gamer, MD  HYDROcodone-acetaminophen (NORCO/VICODIN) 5-325 MG per tablet Take 2 tablets by mouth 2 (two) times daily.    Historical Provider, MD   BP 124/76 mmHg  Pulse 88  Temp(Src) 98.8 F (37.1 C) (Oral)  Resp 16  SpO2 97%  LMP 10/25/2014 Physical Exam  Constitutional: She is oriented to person, place, and time. She appears well-developed and well-nourished. No distress.  HENT:  Head: Normocephalic and atraumatic.  Neck: Normal range of motion. Neck  supple.  Cardiovascular: Normal rate and regular rhythm.  Exam reveals no gallop and no friction rub.   No murmur heard. Pulmonary/Chest: Effort normal and breath sounds normal. No respiratory distress. She has no wheezes.  Abdominal: Soft. Bowel sounds are normal. She exhibits no distension. There is no tenderness.  Musculoskeletal: Normal range of motion. She exhibits no edema.  Pulses, motor, and sensory are all intact to the bilateral lower extremities.  Neurological: She is alert and oriented to person, place, and time.  Skin: Skin is warm and dry. She is not diaphoretic.  Nursing note and vitals reviewed.   ED Course  Procedures (including  critical care time) Labs Review Labs Reviewed  CBC WITH DIFFERENTIAL/PLATELET  COMPREHENSIVE METABOLIC PANEL  RETICULOCYTES  POC URINE PREG, ED    Imaging Review No results found.   EKG Interpretation None      MDM   Final diagnoses:  None    Patient presents with complaints of back pain that is consistent with her prior sickle cell crises. Her workup reveals elevated reticulocyte count, a mild leukocytosis, hemoglobin of 9.3, but essentially unremarkable laboratory studies otherwise. She was hydrated and given fentanyl and 2 doses of Dilaudid with little relief. I've spoken with Dr. Arnoldo Morale from the hospitalist service who will admit the patient.    Veryl Speak, MD 11/22/14 2233

## 2014-11-22 NOTE — ED Notes (Signed)
Entered patient room to assess pain after admin of Dilaudid, see MAR Patient appears sleepy and lethargic Patient continues to state that her pain is 10/10 despite multiple doses of Dilaudid 2 mg IV, see MAR Patient informed to keep [facility] in place due to decreased O2 sat--patient agrees and v/u Will make EDP aware of patient's continued c/o pain

## 2014-11-22 NOTE — H&P (Signed)
[facility] Admission History and Physical       [name] LPF:[id number] DOB: 08/21/1991 DOA: 11/22/2014  Referring physician: EDP PCP: [name], MD  Specialists:   Chief Complaint: Severe Back Pain  HPI: Carmen [name] is a 24 y.o. female with a history of Sickle Cell Disease who presents to the ED with complaints of severe 10/10 Back Pain since 4 pm that has been unrelieved by her home pain med rx.   She denies any fevers, chills or trauma.  She was medicated several times in the ED without improvement.       Review of Systems:  Constitutional: No Weight Loss, No Weight Gain, Night Sweats, Fevers, Chills, Dizziness, Fatigue, or Generalized Weakness HEENT: No Headaches, Difficulty Swallowing,Tooth/Dental Problems,Sore Throat,  No Sneezing, Rhinitis, Ear Ache, Nasal Congestion, or Post Nasal Drip,  Cardio-vascular:  No Chest pain, Orthopnea, PND, Edema in Lower Extremities, Anasarca, Dizziness, Palpitations  Resp: No Dyspnea, No DOE, No Productive Cough, No Non-Productive Cough, No Hemoptysis, No Wheezing.    GI: No Heartburn, Indigestion, Abdominal Pain, Nausea, Vomiting, Diarrhea, Hematemesis, Hematochezia, Melena, Change in Bowel Habits,  Loss of Appetite  GU: No Dysuria, Change in Color of Urine, No Urgency or Frequency, No Flank pain.  Musculoskeletal: No Joint Pain or Swelling, No Decreased Range of Motion, +Back Pain.  Neurologic: No Syncope, No Seizures, Muscle Weakness, Paresthesia, Vision Disturbance or Loss, No Diplopia, No Vertigo, No Difficulty Walking,  Skin: No Rash or Lesions. Psych: No Change in Mood or Affect, No Depression or Anxiety, No Memory loss, No Confusion, or Hallucinations   Past Medical History  Diagnosis Date  . Sickle cell crisis   . Depression   . Headache(784.0)   . Abortion     05/2012     Past Surgical History  Procedure Laterality Date  . Splenectomy    . Splenectomy, total        Prior to Admission medications    Medication Sig Start Date End Date Taking? Authorizing Provider  cetirizine (ZYRTEC) 10 MG tablet Take 10 mg by mouth daily.    Yes Historical Provider, MD  diphenhydramine-acetaminophen (TYLENOL PM) 25-500 MG TABS Take 2 tablets by mouth as needed (pain/sleep).    Yes Historical Provider, MD  HYDROcodone-acetaminophen (NORCO/VICODIN) 5-325 MG per tablet Take 2 tablets by mouth 2 (two) times daily.   Yes Historical Provider, MD  aspirin-acetaminophen-caffeine (EXCEDRIN MIGRAINE) [phone] MG per tablet Take 2 tablets by mouth every 6 (six) hours as needed for headache. Patient not taking: Reported on 11/22/2014 05/13/14   Noland Fordyce, PA-C  folic acid (FOLVITE) 1 MG tablet Take 1 tablet (1 mg total) by mouth daily. Patient not taking: Reported on 11/22/2014 05/18/14   Leana Gamer, MD      Allergies  Allergen Reactions  . Other Nausea Only    Patient stated that all iv pain medication make her have really bad nausea     Social History:  reports that she has never smoked. She has never used smokeless tobacco. She reports that she does not drink alcohol or use illicit drugs.     Family History  Problem Relation Age of Onset  . Hypertension Mother   . Sickle cell anemia Sister   . Kidney disease Sister   . Arthritis Sister   . Sickle cell anemia Sister   . Sickle cell trait Sister   . Heart disease Maternal Aunt   . Heart disease Maternal Uncle  Physical Exam:  GEN:  Well Nourished Well Developed  24 y.o. female  examined  and in no acute distress; cooperative with exam Filed Vitals:   11/22/14 2113 11/22/14 2130 11/22/14 2141 11/22/14 2205  BP: 124/69  114/71 115/64  Pulse: 92 82 77 95  Temp: 98.6 F (37 C)     TempSrc: Oral     Resp: 18 21 16 19   SpO2: 100% 95% 95% 95%   Blood pressure 115/64, pulse 95, temperature 98.6 F (37 C), temperature source Oral, resp. rate 19, last menstrual period 10/25/2014, SpO2 95 %. PSYCH: She is alert and oriented x4;  does not appear anxious does not appear depressed; affect is normal HEENT: Normocephalic and Atraumatic, Mucous membranes pink; PERRLA; EOM intact; Fundi:  Benign;  No scleral icterus, Nares: Patent, Oropharynx: Clear, Fair Dentition,    Neck:  FROM, No Cervical Lymphadenopathy nor Thyromegaly or Carotid Bruit; No JVD; Breasts:: Not examined CHEST WALL: No tenderness CHEST: Normal respiration, clear to auscultation bilaterally HEART: Regular rate and rhythm; no murmurs rubs or gallops BACK: No kyphosis or scoliosis; No CVA tenderness ABDOMEN: Positive Bowel Sounds, Soft Non-Tender; No Masses, No Organomegaly. Rectal Exam: Not done EXTREMITIES: No Cyanosis, Clubbing, or Edema; No Ulcerations. Genitalia: not examined PULSES: 2+ and symmetric SKIN: Normal hydration no rash or ulceration CNS:  Alert and Oriented x 4, No Focal Deficits Vascular: pulses palpable throughout    Labs on Admission:  Basic Metabolic Panel:  Recent Labs Lab 11/22/14 1824 11/22/14 2109  NA 137 140  K 3.5 3.8  CL 105 107  CO2 20  --   GLUCOSE 89 91  BUN 8 7  CREATININE 0.78 0.70  CALCIUM 9.6  --    Liver Function Tests:  Recent Labs Lab 11/22/14 1824  AST 48*  ALT 40*  ALKPHOS 62  BILITOT 1.9*  PROT 8.7*  ALBUMIN 4.8   No results for input(s): LIPASE, AMYLASE in the last 168 hours. No results for input(s): AMMONIA in the last 168 hours. CBC:  Recent Labs Lab 11/22/14 1824 11/22/14 2109  WBC 11.9*  --   NEUTROABS 7.8*  --   HGB 9.3* 9.9*  HCT 25.9* 29.0*  MCV 82.2  --   PLT 592*  --    Cardiac Enzymes: No results for input(s): CKTOTAL, CKMB, CKMBINDEX, TROPONINI in the last 168 hours.  BNP (last 3 results) No results for input(s): PROBNP in the last 8760 hours. CBG: No results for input(s): GLUCAP in the last 168 hours.  Radiological Exams on Admission: No results found.      Assessment/Plan:   24 y.o. female with  Principal Problem:   1.   Sickle cell crisis   PCA  Dilaudid for Pain  Active Problems:   2.   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  Leukocytosis-Stress Rxn   Monitor Trend     4.  Thrombocytosis- Stress Rxn   Monitor Trend     5.   DVT Prophylaxis   Lovenox     Code Status:      FULL CODE Family Communication:   No Family Present Disposition Plan:   Inpatient      Time spent:  [place] [facility] Pager [phone]   If [place] Please Contact the Day Rounding Team MD for [facility]  If 7PM-7AM, Please Contact Night-Floor Coverage  [email or web address] Password [facility] 11/22/2014, 10:48 PM

## 2014-11-22 NOTE — ED Notes (Signed)
[facility] presents with a 24 yo female from home with generalized back pain/possible sickle cell crisis that began today after she woke up from sleeping around 4:30 pm.  Pt rates pain at "10" out of 10.  VS stable.  No other complains of pain.

## 2014-11-22 NOTE — ED Notes (Signed)
Bed: ZN35 Expected date:  Expected time:  Means of arrival:  Comments: EMS- Sickle Cell Crisis

## 2014-11-23 DIAGNOSIS — D5700 Hb-SS disease with crisis, unspecified: Principal | ICD-10-CM

## 2014-11-23 LAB — LACTATE DEHYDROGENASE: LDH: 236 U/L (ref 94–250)

## 2014-11-23 LAB — CBC WITH DIFFERENTIAL/PLATELET
BASOS ABS: 0 10*3/uL (ref 0.0–0.1)
Basophils Relative: 0 % (ref 0–1)
EOS PCT: 0 % (ref 0–5)
Eosinophils Absolute: 0 10*3/uL (ref 0.0–0.7)
HCT: 22.1 % — ABNORMAL LOW (ref 36.0–46.0)
Hemoglobin: 7.9 g/dL — ABNORMAL LOW (ref 12.0–15.0)
LYMPHS PCT: 22 % (ref 12–46)
Lymphs Abs: 2.7 10*3/uL (ref 0.7–4.0)
MCH: 29.5 pg (ref 26.0–34.0)
MCHC: 35.7 g/dL (ref 30.0–36.0)
MCV: 82.5 fL (ref 78.0–100.0)
Monocytes Absolute: 1.2 10*3/uL — ABNORMAL HIGH (ref 0.1–1.0)
Monocytes Relative: 10 % (ref 3–12)
NEUTROS ABS: 8.2 10*3/uL — AB (ref 1.7–7.7)
Neutrophils Relative %: 67 % (ref 43–77)
Platelets: 498 10*3/uL — ABNORMAL HIGH (ref 150–400)
RBC: 2.68 MIL/uL — ABNORMAL LOW (ref 3.87–5.11)
RDW: 16.7 % — AB (ref 11.5–15.5)
WBC: 12.2 10*3/uL — AB (ref 4.0–10.5)

## 2014-11-23 LAB — BASIC METABOLIC PANEL
ANION GAP: 7 (ref 5–15)
BUN: 7 mg/dL (ref 6–23)
CO2: 22 mmol/L (ref 19–32)
Calcium: 8.9 mg/dL (ref 8.4–10.5)
Chloride: 108 mmol/L (ref 96–112)
Creatinine, Ser: 0.58 mg/dL (ref 0.50–1.10)
GFR calc Af Amer: >90 mL/min (ref >90)
GLUCOSE: 108 mg/dL — AB (ref 70–99)
Potassium: 3.8 mmol/L (ref 3.5–5.1)
SODIUM: 137 mmol/L (ref 135–145)

## 2014-11-23 LAB — MAGNESIUM: Magnesium: 2.1 mg/dL (ref 1.5–2.5)

## 2014-11-23 MED ORDER — INFLUENZA VAC SPLIT QUAD 0.5 ML IM SUSY
0.5000 mL | PREFILLED_SYRINGE | INTRAMUSCULAR | Status: DC
  Filled 2014-11-23 (×2): qty 0.5

## 2014-11-23 MED ORDER — KETOROLAC TROMETHAMINE 15 MG/ML IJ SOLN
30.0000 mg | Freq: Four times a day (QID) | INTRAMUSCULAR | Status: DC
  Administered 2014-11-23 – 2014-11-24 (×4): 30 mg via INTRAVENOUS
  Filled 2014-11-23 (×6): qty 2

## 2014-11-23 NOTE — Progress Notes (Signed)
Subjective: 24 year old female with known history of sickle cell disease admitted with sickle cell painful crisis. Patient is still complaining of 9 out of 10 pain in her rib cage and back. Pain is not getting much relief from the current dose of Dilaudid. She is on Dilaudid PCA regular dose with 50 mg of Toradol every 6 hours. Also on IV fluids. No shortness of breath no cough no nausea vomiting or diarrhea. Patient was on Vicodin at home prior to admission schedule 4 times a day. According to patient she was not getting adequate relief from that and was supposed to have her medications changed but was not able to get to change before coming in. She has so far used X milligram of the Dilaudid since admission last night.  Objective: Vital signs in last 24 hours: Temp:  [98.2 F (36.8 C)-98.9 F (37.2 C)] 98.9 F (37.2 C) (01/23 0948) Pulse Rate:  [65-99] 81 (01/23 0948) Resp:  [12-33] 16 (01/23 0948) BP: (100-128)/(60-79) 100/60 mmHg (01/23 0948) SpO2:  [93 %-100 %] 97 % (01/23 0948) Weight change:  Last BM Date: 11/23/14  Intake/Output from previous day:   Intake/Output this shift:    General appearance: alert, cooperative and no distress Eyes: conjunctivae/corneas clear. PERRL, EOM's intact. Fundi benign. Neck: no adenopathy, no carotid bruit, no JVD, supple, symmetrical, trachea midline and thyroid not enlarged, symmetric, no tenderness/mass/nodules Back: symmetric, no curvature. ROM normal. No CVA tenderness. Resp: clear to auscultation bilaterally Chest wall: no tenderness Cardio: regular rate and rhythm, S1, S2 normal, no murmur, click, rub or gallop GI: soft, non-tender; bowel sounds normal; no masses,  no organomegaly Extremities: extremities normal, atraumatic, no cyanosis or edema Pulses: 2+ and symmetric Skin: Skin color, texture, turgor normal. No rashes or lesions Neurologic: Grossly normal  Lab Results:  Recent Labs  11/22/14 1824 11/22/14 2109 11/23/14 0410   WBC 11.9*  --  12.2*  HGB 9.3* 9.9* 7.9*  HCT 25.9* 29.0* 22.1*  PLT 592*  --  498*   BMET  Recent Labs  11/22/14 1824 11/22/14 2109 11/23/14 0410  NA 137 140 137  K 3.5 3.8 3.8  CL 105 107 108  CO2 20  --  22  GLUCOSE 89 91 108*  BUN 8 7 7   CREATININE 0.78 0.70 0.58  CALCIUM 9.6  --  8.9    Studies/Results: No results found.  Medications: I have reviewed the patient's current medications.  Assessment/Plan: A 24 year old female with known history of sickle cell disease admitted with sickle cell crisis.  #1 sickle cell painful crisis: Patient is currently not getting much relief. I will change her Dilaudid PCA dosing to high-dose and increase her bolus dose. If she is not improving in the next 24 hours I'll consider physician assisted dosing with or without basal dose. I will also check the Toradol dosing to 30 mg every 6 hours  #2 sickle cell anemia: Hemoglobin has dropped 2 g overnight. Her LDH yesterday was not that elevated. We will monitor the hemoglobin and repeat LDH in the morning if her hemoglobin keeps dropping.  #3 leukocytosis: White count is increasing most likely secondary to sickle cell crisis. Autosplenectomy also a course. We'll continue to monitor.  #4 Thrombocytosis: Due to sickle cell crisis.  LOS: 1 day   GARBA,LAWAL 11/23/2014, 10:13 AM

## 2014-11-24 ENCOUNTER — Encounter (HOSPITAL_COMMUNITY): Payer: Self-pay | Admitting: *Deleted

## 2014-11-24 ENCOUNTER — Emergency Department (HOSPITAL_COMMUNITY): Payer: Medicaid Other

## 2014-11-24 ENCOUNTER — Emergency Department (HOSPITAL_COMMUNITY)
Admission: EM | Admit: 2014-11-24 | Discharge: 2014-11-24 | Disposition: A | Discharge status: Home / Self Care | Payer: Medicaid Other | Attending: Emergency Medicine | Admitting: Emergency Medicine

## 2014-11-24 DIAGNOSIS — Z79899 Other long term (current) drug therapy: Secondary | ICD-10-CM | POA: Insufficient documentation

## 2014-11-24 DIAGNOSIS — Z8659 Personal history of other mental and behavioral disorders: Secondary | ICD-10-CM | POA: Insufficient documentation

## 2014-11-24 DIAGNOSIS — R0600 Dyspnea, unspecified: Secondary | ICD-10-CM | POA: Insufficient documentation

## 2014-11-24 DIAGNOSIS — D5700 Hb-SS disease with crisis, unspecified: Secondary | ICD-10-CM | POA: Insufficient documentation

## 2014-11-24 DIAGNOSIS — Z3202 Encounter for pregnancy test, result negative: Secondary | ICD-10-CM | POA: Insufficient documentation

## 2014-11-24 LAB — POC URINE PREG, ED: Preg Test, Ur: NEGATIVE

## 2014-11-24 LAB — BASIC METABOLIC PANEL
Anion gap: 11 (ref 5–15)
BUN: <5 mg/dL — ABNORMAL LOW (ref 6–23)
CALCIUM: 9.4 mg/dL (ref 8.4–10.5)
CO2: 21 mmol/L (ref 19–32)
Chloride: 102 mmol/L (ref 96–112)
Creatinine, Ser: 0.55 mg/dL (ref 0.50–1.10)
Glucose, Bld: 86 mg/dL (ref 70–99)
POTASSIUM: 3.2 mmol/L — AB (ref 3.5–5.1)
SODIUM: 134 mmol/L — AB (ref 135–145)

## 2014-11-24 LAB — CBC WITH DIFFERENTIAL/PLATELET
Basophils Absolute: 0 10*3/uL (ref 0.0–0.1)
Basophils Absolute: 0.1 10*3/uL (ref 0.0–0.1)
Basophils Relative: 0 % (ref 0–1)
Basophils Relative: 1 % (ref 0–1)
Eosinophils Absolute: 0.1 10*3/uL (ref 0.0–0.7)
Eosinophils Absolute: 0.1 10*3/uL (ref 0.0–0.7)
Eosinophils Relative: 1 % (ref 0–5)
Eosinophils Relative: 1 % (ref 0–5)
HEMATOCRIT: 21.2 % — AB (ref 36.0–46.0)
HEMATOCRIT: 23.7 % — AB (ref 36.0–46.0)
Hemoglobin: 7.7 g/dL — ABNORMAL LOW (ref 12.0–15.0)
Hemoglobin: 8.7 g/dL — ABNORMAL LOW (ref 12.0–15.0)
LYMPHS ABS: 3.5 10*3/uL (ref 0.7–4.0)
LYMPHS PCT: 33 % (ref 12–46)
Lymphocytes Relative: 39 % (ref 12–46)
Lymphs Abs: 4 10*3/uL (ref 0.7–4.0)
MCH: 29.2 pg (ref 26.0–34.0)
MCH: 28.5 pg (ref 26.0–34.0)
MCHC: 36.3 g/dL — ABNORMAL HIGH (ref 30.0–36.0)
MCHC: 36.7 g/dL — ABNORMAL HIGH (ref 30.0–36.0)
MCV: 80.3 fL (ref 78.0–100.0)
MCV: 77.7 fL — ABNORMAL LOW (ref 78.0–100.0)
MONO ABS: 1.1 10*3/uL — AB (ref 0.1–1.0)
MONO ABS: 1.5 10*3/uL — AB (ref 0.1–1.0)
Monocytes Relative: 12 % (ref 3–12)
Monocytes Relative: 13 % — ABNORMAL HIGH (ref 3–12)
NEUTROS PCT: 52 % (ref 43–77)
Neutro Abs: 4.3 10*3/uL (ref 1.7–7.7)
Neutro Abs: 6.2 10*3/uL (ref 1.7–7.7)
Neutrophils Relative %: 48 % (ref 43–77)
PLATELETS: 486 10*3/uL — AB (ref 150–400)
Platelets: 560 10*3/uL — ABNORMAL HIGH (ref 150–400)
RBC: 2.64 MIL/uL — AB (ref 3.87–5.11)
RBC: 3.05 MIL/uL — ABNORMAL LOW (ref 3.87–5.11)
RDW: 18.5 % — ABNORMAL HIGH (ref 11.5–15.5)
RDW: 17.6 % — ABNORMAL HIGH (ref 11.5–15.5)
WBC: 8.9 10*3/uL (ref 4.0–10.5)
WBC: 11.9 10*3/uL — AB (ref 4.0–10.5)

## 2014-11-24 LAB — URINALYSIS, ROUTINE W REFLEX MICROSCOPIC
Glucose, UA: NEGATIVE mg/dL
Hgb urine dipstick: NEGATIVE
KETONES UR: NEGATIVE mg/dL
Nitrite: NEGATIVE
PH: 5.5 (ref 5.0–8.0)
PROTEIN: NEGATIVE mg/dL
Specific Gravity, Urine: 1.009 (ref 1.005–1.030)
Urobilinogen, UA: 2 mg/dL — ABNORMAL HIGH (ref 0.0–1.0)

## 2014-11-24 LAB — RETICULOCYTES
RBC.: 3.05 MIL/uL — AB (ref 3.87–5.11)
RETIC CT PCT: 8.5 % — AB (ref 0.4–3.1)
Retic Count, Absolute: 259.3 10*3/uL — ABNORMAL HIGH (ref 19.0–186.0)

## 2014-11-24 LAB — COMPREHENSIVE METABOLIC PANEL
ALBUMIN: 3.6 g/dL (ref 3.5–5.2)
ALK PHOS: 59 U/L (ref 39–117)
ALT: 114 U/L — ABNORMAL HIGH (ref 0–35)
AST: 121 U/L — ABNORMAL HIGH (ref 0–37)
Anion gap: 6 (ref 5–15)
BUN: 5 mg/dL — ABNORMAL LOW (ref 6–23)
CO2: 25 mmol/L (ref 19–32)
CREATININE: 0.44 mg/dL — AB (ref 0.50–1.10)
Calcium: 8.9 mg/dL (ref 8.4–10.5)
Chloride: 105 mmol/L (ref 96–112)
GFR calc Af Amer: >90 mL/min (ref >90)
GFR calc non Af Amer: >90 mL/min (ref >90)
Glucose, Bld: 111 mg/dL — ABNORMAL HIGH (ref 70–99)
POTASSIUM: 3.4 mmol/L — AB (ref 3.5–5.1)
Sodium: 136 mmol/L (ref 135–145)
TOTAL PROTEIN: 7.2 g/dL (ref 6.0–8.3)
Total Bilirubin: 3.1 mg/dL — ABNORMAL HIGH (ref 0.3–1.2)

## 2014-11-24 LAB — URINE MICROSCOPIC-ADD ON

## 2014-11-24 MED ORDER — SODIUM CHLORIDE 0.9 % IV BOLUS (SEPSIS)
1000.0000 mL | Freq: Once | INTRAVENOUS | Status: AC
  Administered 2014-11-24: 1000 mL via INTRAVENOUS

## 2014-11-24 MED ORDER — DIPHENHYDRAMINE HCL 50 MG/ML IJ SOLN
25.0000 mg | Freq: Once | INTRAMUSCULAR | Status: AC
Start: 2014-11-24 — End: 2014-11-24
  Administered 2014-11-24: 25 mg via INTRAVENOUS
  Filled 2014-11-24: qty 1

## 2014-11-24 MED ORDER — HYDROCODONE-ACETAMINOPHEN 5-325 MG PO TABS
2.0000 | ORAL_TABLET | ORAL | Status: DC | PRN
  Administered 2014-11-24: 2 via ORAL
  Filled 2014-11-24: qty 2

## 2014-11-24 MED ORDER — KETOROLAC TROMETHAMINE 30 MG/ML IJ SOLN
30.0000 mg | Freq: Once | INTRAMUSCULAR | Status: AC
  Administered 2014-11-24: 30 mg via INTRAVENOUS
  Filled 2014-11-24: qty 1

## 2014-11-24 MED ORDER — HYDROMORPHONE HCL 1 MG/ML IJ SOLN
2.0000 mg | Freq: Once | INTRAMUSCULAR | Status: AC
  Administered 2014-11-24: 2 mg via INTRAVENOUS
  Filled 2014-11-24: qty 2

## 2014-11-24 MED ORDER — OXYCODONE-ACETAMINOPHEN 5-325 MG PO TABS: 1.0000 | ORAL_TABLET | Freq: Four times a day (QID) | ORAL | Status: DC | PRN

## 2014-11-24 MED ORDER — HYDROXYUREA 500 MG PO CAPS: 500.0000 mg | ORAL_CAPSULE | Freq: Two times a day (BID) | ORAL | Status: DC

## 2014-11-24 NOTE — Discharge Summary (Signed)
Physician Discharge Summary  Patient ID: Carmen [name] MRN: [id number] DOB/AGE: 1990/11/29 24 y.o.  Admit date: 11/22/2014 Discharge date: 11/24/2014  Admission Diagnoses:  Discharge Diagnoses:  Principal Problem:   Sickle cell crisis Active Problems:   Leukocytosis   Thrombocytosis   Discharged Condition: good  Hospital Course: Patient was admitted with sickle cell painful crisis. She was taking home medication that did not help her much. She was started on IV Dilaudid PCA with Toradol and IV fluids. She did not use much of the Dilaudid on average about 7 mg a day. Her pain is currently down to 2 out of 10 and she feels much better. She therefore does not require high-dose narcotics at home. She has been on Vicodin which she was asked to continue taking. She was discharged to follow with her primary care physician. She has been counseled about whether and how to behave out in the cold.  Consults: None  Significant Diagnostic Studies: labs: Serial CBCs and CMP is where checked. Most well within normal limits.  Treatments: IV hydration and analgesia: acetaminophen and Dilaudid  Discharge Exam: Blood pressure 105/63, pulse 73, temperature 98.2 F (36.8 C), temperature source Oral, resp. rate 22, height 5\' 3"  (1.6 m), weight 85.458 kg (188 lb 6.4 oz), last menstrual period 10/25/2014, SpO2 93 %. General appearance: alert, cooperative and no distress Eyes: conjunctivae/corneas clear. PERRL, EOM's intact. Fundi benign. Ears: normal TM's and external ear canals both ears Neck: no adenopathy, no carotid bruit, no JVD, supple, symmetrical, trachea midline and thyroid not enlarged, symmetric, no tenderness/mass/nodules Back: symmetric, no curvature. ROM normal. No CVA tenderness. Resp: clear to auscultation bilaterally Chest wall: no tenderness Cardio: regular rate and rhythm, S1, S2 normal, no murmur, click, rub or gallop GI: soft, non-tender; bowel sounds normal; no masses,  no  organomegaly Extremities: extremities normal, atraumatic, no cyanosis or edema Pulses: 2+ and symmetric Skin: Skin color, texture, turgor normal. No rashes or lesions Neurologic: Grossly normal  Disposition: 01-Home or Self Care     Medication List    STOP taking these medications        aspirin-acetaminophen-caffeine 250-250-65 MG per tablet  Commonly known as:  EXCEDRIN MIGRAINE     folic acid 1 MG tablet  Commonly known as:  FOLVITE      TAKE these medications        cetirizine 10 MG tablet  Commonly known as:  ZYRTEC  Take 10 mg by mouth daily.     diphenhydramine-acetaminophen 25-500 MG Tabs  Commonly known as:  TYLENOL PM  Take 2 tablets by mouth as needed (pain/sleep).     HYDROcodone-acetaminophen 5-325 MG per tablet  Commonly known as:  NORCO/VICODIN  Take 2 tablets by mouth 2 (two) times daily.     hydroxyurea 500 MG capsule  Commonly known as:  HYDREA  Take 1 capsule (500 mg total) by mouth 2 (two) times daily. May take with food to minimize GI side effects.         SignedBarbette Merino 11/24/2014, 10:31 AM  Time spent 34 minutes

## 2014-11-24 NOTE — Progress Notes (Signed)
Patient discharged to home with sister, discharge instructions reviewed with patient who verbalized understanding. No new RX's given to patient.

## 2014-11-24 NOTE — Progress Notes (Signed)
Dr Jonelle Sidle called concerning patient being concerned about pain being 7/10 and her IV possibly infiltrated. Patient had been told that she was being discharged. This RN was instructed to give bolus dose of 2 mg of Dilaudid IV if IV was working and give 2 tabs Norco PO. We would reassess pain later and see if we need to restart IV if pain was not better. Will continue to monitor.

## 2014-11-24 NOTE — ED Provider Notes (Signed)
CSN: [id number]     Arrival date & time 11/24/14  1418 History   First MD Initiated Contact with Patient 11/24/14 1620     Chief Complaint  Patient presents with  . Sickle Cell Pain Crisis     (Consider location/radiation/quality/duration/timing/severity/associated sxs/prior Treatment) Patient is a 24 y.o. female presenting with sickle cell pain.  Sickle Cell Pain Crisis Location:  Back Severity:  Moderate Onset quality:  Gradual Duration:  3 days Similar to previous crisis episodes: yes   Timing:  Constant Progression:  Waxing and waning Chronicity:  New Context: cold exposure   Relieved by:  Prescription drugs Worsened by:  Nothing tried Ineffective treatments:  None tried Associated symptoms: cough and shortness of breath   Associated symptoms: no chest pain, no congestion, no fatigue, no fever, no headaches, no nausea, no sore throat, no swelling of legs, no vision change, no vomiting and no wheezing   Risk factors: hx of pneumonia and prior acute chest   Risk factors: no frequent admissions for fever, no frequent admissions for pain, no hx of stroke and no renal disease     Past Medical History  Diagnosis Date  . Sickle cell crisis   . Depression   . Headache(784.0)   . Abortion     05/2012   Past Surgical History  Procedure Laterality Date  . Splenectomy    . Splenectomy, total     Family History  Problem Relation Age of Onset  . Hypertension Mother   . Sickle cell anemia Sister   . Kidney disease Sister   . Arthritis Sister   . Sickle cell anemia Sister   . Sickle cell trait Sister   . Heart disease Maternal Aunt   . Heart disease Maternal Uncle    History  Substance Use Topics  . Smoking status: Never Smoker   . Smokeless tobacco: Never Used  . Alcohol Use: No   OB History    Gravida Para Term Preterm AB TAB SAB Ectopic Multiple Living   1    1          Review of Systems  Constitutional: Negative for fever, chills, diaphoresis, activity change,  appetite change and fatigue.  HENT: Negative for congestion, facial swelling, rhinorrhea and sore throat.   Eyes: Negative for photophobia and discharge.  Respiratory: Positive for cough and shortness of breath. Negative for chest tightness and wheezing.   Cardiovascular: Negative for chest pain, palpitations and leg swelling.  Gastrointestinal: Negative for nausea, vomiting, abdominal pain and diarrhea.  Endocrine: Negative for polydipsia and polyuria.  Genitourinary: Negative for dysuria, frequency, difficulty urinating and pelvic pain.  Musculoskeletal: Negative for back pain, arthralgias, neck pain and neck stiffness.  Skin: Negative for color change and wound.  Allergic/Immunologic: Negative for immunocompromised state.  Neurological: Negative for facial asymmetry, weakness, numbness and headaches.  Hematological: Does not bruise/bleed easily.  Psychiatric/Behavioral: Negative for confusion and agitation.      Allergies  Other  Home Medications   Prior to Admission medications   Medication Sig Start Date End Date Taking? Authorizing Provider  cetirizine (ZYRTEC) 10 MG tablet Take 10 mg by mouth daily.    Yes Historical Provider, MD  diphenhydramine-acetaminophen (TYLENOL PM) 25-500 MG TABS Take 2 tablets by mouth as needed (pain/sleep).    Yes Historical Provider, MD  HYDROcodone-acetaminophen (NORCO) 10-325 MG per tablet Take 1 tablet by mouth every 6 (six) hours as needed (pain).  11/08/14  Yes Historical Provider, MD  hydroxyurea (HYDREA) 500 MG capsule  Take 1 capsule (500 mg total) by mouth 2 (two) times daily. May take with food to minimize GI side effects. Patient not taking: Reported on 11/24/2014 11/24/14   Elwyn Reach, MD  oxyCODONE-acetaminophen (PERCOCET) 5-325 MG per tablet Take 1 tablet by mouth every 6 (six) hours as needed. 11/24/14   Ernestina Patches, MD   BP 119/93 mmHg  Pulse 90  Temp(Src) 99.5 F (37.5 C) (Oral)  Resp 19  SpO2 98%  LMP 10/25/2014 Physical  Exam  Constitutional: She is oriented to person, place, and time. She appears well-developed and well-nourished. No distress.  HENT:  Head: Normocephalic and atraumatic.  Mouth/Throat: No oropharyngeal exudate.  Eyes: Pupils are equal, round, and reactive to light.  Neck: Normal range of motion. Neck supple.  Cardiovascular: Normal rate, regular rhythm and normal heart sounds.  Exam reveals no gallop and no friction rub.   No murmur heard. Pulmonary/Chest: Effort normal and breath sounds normal. No respiratory distress. She has no wheezes. She has no rales.  Abdominal: Soft. Bowel sounds are normal. She exhibits no distension and no mass. There is no tenderness. There is no rebound and no guarding.  Musculoskeletal: Normal range of motion. She exhibits no edema or tenderness.       Arms: Neurological: She is alert and oriented to person, place, and time. She has normal strength. She displays no tremor. No cranial nerve deficit or sensory deficit. She exhibits normal muscle tone. Coordination and gait normal. GCS eye subscore is 4. GCS verbal subscore is 5. GCS motor subscore is 6.  Skin: Skin is warm and dry.  Psychiatric: She has a normal mood and affect.    ED Course  Procedures (including critical care time) Labs Review Labs Reviewed  CBC WITH DIFFERENTIAL/PLATELET - Abnormal; Notable for the following:    WBC 11.9 (*)    RBC 3.05 (*)    Hemoglobin 8.7 (*)    HCT 23.7 (*)    MCV 77.7 (*)    MCHC 36.7 (*)    RDW 17.6 (*)    Platelets 560 (*)    Monocytes Relative 13 (*)    Monocytes Absolute 1.5 (*)    All other components within normal limits  BASIC METABOLIC PANEL - Abnormal; Notable for the following:    Sodium 134 (*)    Potassium 3.2 (*)    BUN <5 (*)    All other components within normal limits  RETICULOCYTES - Abnormal; Notable for the following:    Retic Ct Pct 8.5 (*)    RBC. 3.05 (*)    Retic Count, Manual 259.3 (*)    All other components within normal limits    URINALYSIS, ROUTINE W REFLEX MICROSCOPIC - Abnormal; Notable for the following:    Color, Urine ORANGE (*)    APPearance CLOUDY (*)    Bilirubin Urine SMALL (*)    Urobilinogen, UA 2.0 (*)    Leukocytes, UA SMALL (*)    All other components within normal limits  URINE MICROSCOPIC-ADD ON - Abnormal; Notable for the following:    Squamous Epithelial / LPF FEW (*)    Bacteria, UA FEW (*)    All other components within normal limits  POC URINE PREG, ED    Imaging Review Dg Chest 2 View  11/24/2014   CLINICAL DATA:  Sickle cell crisis for 2 days.  EXAM: CHEST  2 VIEW  COMPARISON:  04/03/2014  FINDINGS: Normal heart, mediastinum hila. Clear lungs. No pleural effusion or pneumothorax. Bony thorax  is unremarkable.  IMPRESSION: Normal chest radiographs.   Electronically Signed   By: Lajean Manes M.D.   On: 11/24/2014 17:46     EKG Interpretation None      MDM   Final diagnoses:  Dyspnea  Sickle cell pain crisis    Pt is a 24 y.o. female with Pmhx as above who presents with SSPC, after leaving from admission bed at [facility] because she "didn't like how she was being treated by staff".  She denies CP, though has had some SOB/cough. No h/a, numbness, weakness. On PE, VSS, pt in NAD. I cannot appreciate the back swelling the family reports, though MSK tenderness exsists.   Patient feeling improved after 2 doses of IV Dilaudid.  She would like to be discharged home.  Will give a short course of by mouth Percocet for breakthrough pain.  As recommended she should follow up with her primary doctor.   [name] evaluation in the Emergency Department is complete. It has been determined that no acute conditions requiring further emergency intervention are present at this time. The patient/guardian have been advised of the diagnosis and plan. We have discussed signs and symptoms that warrant return to the ED, such as changes or worsening in symptoms, worsening pain, fever, shortness of  breath      Ernestina Patches, MD 11/25/14 2241

## 2014-11-24 NOTE — Discharge Instructions (Signed)
Sickle Cell Anemia, Adult °Sickle cell anemia is a condition in which red blood cells have an abnormal "sickle" shape. This abnormal shape shortens the cells' life span, which results in a lower than normal concentration of red blood cells in the blood. The sickle shape also causes the cells to clump together and block free blood flow through the blood vessels. As a result, the tissues and organs of the body do not receive enough oxygen. Sickle cell anemia causes organ damage and pain and increases the risk of infection. °CAUSES  °Sickle cell anemia is a genetic disorder. Those who receive two copies of the gene have the condition, and those who receive one copy have the trait. °RISK FACTORS °The sickle cell gene is most common in people whose families originated in [place]. Other areas of the globe where sickle cell trait occurs include the [place], [place] and [place], the [place], and the [place].  °SIGNS AND SYMPTOMS °· Pain, especially in the extremities, back, chest, or abdomen (common). The pain may start suddenly or may develop following an illness, especially if there is dehydration. Pain can also occur due to overexertion or exposure to extreme temperature changes. °· Frequent severe bacterial infections, especially certain types of pneumonia and meningitis. °· Pain and swelling in the hands and feet. °· Decreased activity.   °· Loss of appetite.   °· Change in behavior. °· Headaches. °· Seizures. °· Shortness of breath or difficulty breathing. °· Vision changes. °· Skin ulcers. °Those with the trait may not have symptoms or they may have mild symptoms.  °DIAGNOSIS  °Sickle cell anemia is diagnosed with blood tests that demonstrate the genetic trait. It is often diagnosed during the newborn period, due to mandatory testing nationwide. A variety of blood tests, X-rays, CT scans, MRI scans, ultrasounds, and lung function tests may also be done to monitor the condition. °TREATMENT  °Sickle  cell anemia may be treated with: °· Medicines. You may be given pain medicines, antibiotic medicines (to treat and prevent infections) or medicines to increase the production of certain types of hemoglobin. °· Fluids. °· Oxygen. °· Blood transfusions. °HOME CARE INSTRUCTIONS  °· Drink enough fluid to keep your urine clear or pale yellow. Increase your fluid intake in hot weather and during exercise. °· Do not smoke. Smoking lowers oxygen levels in the blood.   °· Only take over-the-counter or prescription medicines for pain, fever, or discomfort as directed by your health care provider. °· Take antibiotics as directed by your health care provider. Make sure you finish them it even if you start to feel better.   °· Take supplements as directed by your health care provider.   °· Consider wearing a medical alert bracelet. This tells anyone caring for you in an emergency of your condition.   °· When traveling, keep your medical information, health care provider's names, and the medicines you take with you at all times.   °· If you develop a fever, do not take medicines to reduce the fever right away. This could cover up a problem that is developing. Notify your health care provider. °· Keep all follow-up appointments with your health care provider. Sickle cell anemia requires regular medical care. °SEEK MEDICAL CARE IF: ° You have a fever. °SEEK IMMEDIATE MEDICAL CARE IF:  °· You feel dizzy or faint.   °· You have new abdominal pain, especially on the left side near the stomach area.   °· You develop a persistent, often uncomfortable and painful penile erection (priapism). If this is not treated immediately it   will lead to impotence.   °· You have numbness your arms or legs or you have a hard time moving them.   °· You have a hard time with speech.   °· You have a fever or persistent symptoms for more than 2-3 days.   °· You have a fever and your symptoms suddenly get worse.   °· You have signs or symptoms of infection.  These include:   °¨ Chills.   °¨ Abnormal tiredness (lethargy).   °¨ Irritability.   °¨ Poor eating.   °¨ Vomiting.   °· You develop pain that is not helped with medicine.   °· You develop shortness of breath. °· You have pain in your chest.   °· You are coughing up pus-like or bloody sputum.   °· You develop a stiff neck. °· Your feet or hands swell or have pain. °· Your abdomen appears bloated. °· You develop joint pain. °MAKE SURE YOU: °· Understand these instructions. °Document Released: 01/26/2006 Document Revised: 03/04/2014 Document Reviewed: 05/30/2013 °Exit[employer]® Patient Information ©2015 [employer]. This information is not intended to replace advice given to you by your health care provider. Make sure you discuss any questions you have with your health care provider. ° °

## 2014-11-24 NOTE — ED Notes (Signed)
Pt reports just leaving [facility] where she was admitted for sickle cell pain crisis for past two days. Has pain to her upper back and knot/swelling.

## 2014-11-28 ENCOUNTER — Encounter (HOSPITAL_COMMUNITY): Payer: Self-pay | Admitting: Emergency Medicine

## 2014-11-28 ENCOUNTER — Emergency Department (HOSPITAL_COMMUNITY): Payer: Medicaid Other

## 2014-11-28 ENCOUNTER — Inpatient Hospital Stay (HOSPITAL_COMMUNITY)
Admission: EM | Admit: 2014-11-28 | Discharge: 2014-12-02 | DRG: 417 | Disposition: A | Discharge status: Home / Self Care | Payer: Medicaid Other | Attending: Internal Medicine | Admitting: Internal Medicine

## 2014-11-28 DIAGNOSIS — K8010 Calculus of gallbladder with chronic cholecystitis without obstruction: Secondary | ICD-10-CM | POA: Diagnosis present

## 2014-11-28 DIAGNOSIS — Z832 Family history of diseases of the blood and blood-forming organs and certain disorders involving the immune mechanism: Secondary | ICD-10-CM

## 2014-11-28 DIAGNOSIS — Z79899 Other long term (current) drug therapy: Secondary | ICD-10-CM

## 2014-11-28 DIAGNOSIS — D72829 Elevated white blood cell count, unspecified: Secondary | ICD-10-CM | POA: Diagnosis present

## 2014-11-28 DIAGNOSIS — Z8249 Family history of ischemic heart disease and other diseases of the circulatory system: Secondary | ICD-10-CM

## 2014-11-28 DIAGNOSIS — J029 Acute pharyngitis, unspecified: Secondary | ICD-10-CM | POA: Diagnosis not present

## 2014-11-28 DIAGNOSIS — Z79891 Long term (current) use of opiate analgesic: Secondary | ICD-10-CM

## 2014-11-28 DIAGNOSIS — K828 Other specified diseases of gallbladder: Secondary | ICD-10-CM | POA: Diagnosis present

## 2014-11-28 DIAGNOSIS — Z6834 Body mass index (BMI) 34.0-34.9, adult: Secondary | ICD-10-CM

## 2014-11-28 DIAGNOSIS — R0789 Other chest pain: Secondary | ICD-10-CM | POA: Diagnosis present

## 2014-11-28 DIAGNOSIS — M549 Dorsalgia, unspecified: Secondary | ICD-10-CM | POA: Diagnosis present

## 2014-11-28 DIAGNOSIS — D5700 Hb-SS disease with crisis, unspecified: Secondary | ICD-10-CM | POA: Diagnosis present

## 2014-11-28 DIAGNOSIS — Z888 Allergy status to other drugs, medicaments and biological substances status: Secondary | ICD-10-CM | POA: Diagnosis not present

## 2014-11-28 DIAGNOSIS — D473 Essential (hemorrhagic) thrombocythemia: Secondary | ICD-10-CM | POA: Diagnosis present

## 2014-11-28 DIAGNOSIS — E876 Hypokalemia: Secondary | ICD-10-CM | POA: Diagnosis present

## 2014-11-28 DIAGNOSIS — K829 Disease of gallbladder, unspecified: Secondary | ICD-10-CM

## 2014-11-28 DIAGNOSIS — E669 Obesity, unspecified: Secondary | ICD-10-CM | POA: Diagnosis present

## 2014-11-28 DIAGNOSIS — D75839 Thrombocytosis, unspecified: Secondary | ICD-10-CM | POA: Diagnosis present

## 2014-11-28 DIAGNOSIS — K8000 Calculus of gallbladder with acute cholecystitis without obstruction: Secondary | ICD-10-CM | POA: Diagnosis not present

## 2014-11-28 DIAGNOSIS — Z9049 Acquired absence of other specified parts of digestive tract: Secondary | ICD-10-CM | POA: Diagnosis present

## 2014-11-28 DIAGNOSIS — R109 Unspecified abdominal pain: Secondary | ICD-10-CM

## 2014-11-28 LAB — COMPREHENSIVE METABOLIC PANEL
ALT: 53 U/L — AB (ref 0–35)
AST: 33 U/L (ref 0–37)
Albumin: 3.9 g/dL (ref 3.5–5.2)
Alkaline Phosphatase: 67 U/L (ref 39–117)
Anion gap: 6 (ref 5–15)
BUN: 6 mg/dL (ref 6–23)
CO2: 24 mmol/L (ref 19–32)
Calcium: 9 mg/dL (ref 8.4–10.5)
Chloride: 109 mmol/L (ref 96–112)
Creatinine, Ser: 0.5 mg/dL (ref 0.50–1.10)
GFR calc Af Amer: >90 mL/min (ref >90)
GFR calc non Af Amer: >90 mL/min (ref >90)
GLUCOSE: 93 mg/dL (ref 70–99)
Potassium: 2.8 mmol/L — ABNORMAL LOW (ref 3.5–5.1)
Sodium: 139 mmol/L (ref 135–145)
Total Bilirubin: 1.8 mg/dL — ABNORMAL HIGH (ref 0.3–1.2)
Total Protein: 7.5 g/dL (ref 6.0–8.3)

## 2014-11-28 LAB — CBC WITH DIFFERENTIAL/PLATELET
BASOS ABS: 0 10*3/uL (ref 0.0–0.1)
BASOS ABS: 0 10*3/uL (ref 0.0–0.1)
BASOS PCT: 0 % (ref 0–1)
Basophils Relative: 0 % (ref 0–1)
EOS ABS: 0.1 10*3/uL (ref 0.0–0.7)
EOS PCT: 1 % (ref 0–5)
Eosinophils Absolute: 0.1 10*3/uL (ref 0.0–0.7)
Eosinophils Relative: 1 % (ref 0–5)
HCT: 22.6 % — ABNORMAL LOW (ref 36.0–46.0)
HCT: 19.9 % — ABNORMAL LOW (ref 36.0–46.0)
Hemoglobin: 7.9 g/dL — ABNORMAL LOW (ref 12.0–15.0)
Hemoglobin: 7 g/dL — ABNORMAL LOW (ref 12.0–15.0)
LYMPHS ABS: 3.6 10*3/uL (ref 0.7–4.0)
LYMPHS PCT: 28 % (ref 12–46)
Lymphocytes Relative: 28 % (ref 12–46)
Lymphs Abs: 3.4 10*3/uL (ref 0.7–4.0)
MCH: 29.6 pg (ref 26.0–34.0)
MCH: 29.9 pg (ref 26.0–34.0)
MCHC: 35 g/dL (ref 30.0–36.0)
MCHC: 35.2 g/dL (ref 30.0–36.0)
MCV: 84.6 fL (ref 78.0–100.0)
MCV: 85 fL (ref 78.0–100.0)
MONO ABS: 1.2 10*3/uL — AB (ref 0.1–1.0)
Monocytes Absolute: 1.2 10*3/uL — ABNORMAL HIGH (ref 0.1–1.0)
Monocytes Relative: 10 % (ref 3–12)
Monocytes Relative: 9 % (ref 3–12)
NEUTROS ABS: 8 10*3/uL — AB (ref 1.7–7.7)
NEUTROS PCT: 61 % (ref 43–77)
Neutro Abs: 7.4 10*3/uL (ref 1.7–7.7)
Neutrophils Relative %: 62 % (ref 43–77)
PLATELETS: 623 10*3/uL — AB (ref 150–400)
PLATELETS: 515 10*3/uL — AB (ref 150–400)
RBC: 2.67 MIL/uL — ABNORMAL LOW (ref 3.87–5.11)
RBC: 2.34 MIL/uL — ABNORMAL LOW (ref 3.87–5.11)
RDW: 19.9 % — AB (ref 11.5–15.5)
RDW: 21 % — AB (ref 11.5–15.5)
WBC: 12.1 10*3/uL — ABNORMAL HIGH (ref 4.0–10.5)
WBC: 12.9 10*3/uL — AB (ref 4.0–10.5)

## 2014-11-28 LAB — RETICULOCYTES
RBC.: 2.67 MIL/uL — AB (ref 3.87–5.11)
RETIC COUNT ABSOLUTE: 384.5 10*3/uL — AB (ref 19.0–186.0)
Retic Ct Pct: 14.4 % — ABNORMAL HIGH (ref 0.4–3.1)

## 2014-11-28 LAB — URINALYSIS, ROUTINE W REFLEX MICROSCOPIC
Bilirubin Urine: NEGATIVE
Glucose, UA: NEGATIVE mg/dL
Ketones, ur: NEGATIVE mg/dL
Nitrite: NEGATIVE
Protein, ur: NEGATIVE mg/dL
SPECIFIC GRAVITY, URINE: 1.012 (ref 1.005–1.030)
UROBILINOGEN UA: 1 mg/dL (ref 0.0–1.0)
pH: 6.5 (ref 5.0–8.0)

## 2014-11-28 LAB — I-STAT TROPONIN, ED: Troponin i, poc: 0 ng/mL (ref 0.00–0.08)

## 2014-11-28 LAB — URINE MICROSCOPIC-ADD ON

## 2014-11-28 LAB — TROPONIN I: Troponin I: <0.03 ng/mL (ref <0.031)

## 2014-11-28 LAB — POC URINE PREG, ED: Preg Test, Ur: NEGATIVE

## 2014-11-28 MED ORDER — HYDROMORPHONE HCL 1 MG/ML IJ SOLN
1.0000 mg | Freq: Once | INTRAMUSCULAR | Status: AC
  Administered 2014-11-28: 1 mg via INTRAVENOUS
  Filled 2014-11-28: qty 1

## 2014-11-28 MED ORDER — POTASSIUM CHLORIDE CRYS ER 20 MEQ PO TBCR
40.0000 meq | EXTENDED_RELEASE_TABLET | Freq: Once | ORAL | Status: AC
  Administered 2014-11-28: 40 meq via ORAL
  Filled 2014-11-28: qty 2

## 2014-11-28 MED ORDER — SODIUM CHLORIDE 0.9 % IV BOLUS (SEPSIS)
1000.0000 mL | Freq: Once | INTRAVENOUS | Status: AC
  Administered 2014-11-28: 1000 mL via INTRAVENOUS

## 2014-11-28 MED ORDER — POTASSIUM CHLORIDE 10 MEQ/100ML IV SOLN
10.0000 meq | INTRAVENOUS | Status: AC
  Administered 2014-11-28 (×2): 10 meq via INTRAVENOUS
  Filled 2014-11-28 (×2): qty 100

## 2014-11-28 MED ORDER — DIPHENHYDRAMINE HCL 12.5 MG/5ML PO ELIX
12.5000 mg | ORAL_SOLUTION | Freq: Four times a day (QID) | ORAL | Status: DC | PRN
  Administered 2014-11-30: 12.5 mg via ORAL
  Filled 2014-11-28: qty 5

## 2014-11-28 MED ORDER — HYDROMORPHONE 0.3 MG/ML IV SOLN
INTRAVENOUS | Status: DC
  Administered 2014-11-28: 21:00:00 via INTRAVENOUS
  Administered 2014-11-29: 0.3 mg via INTRAVENOUS
  Administered 2014-11-29: 0.6 mg via INTRAVENOUS
  Administered 2014-11-30: 1.2 mL via INTRAVENOUS
  Administered 2014-11-30: 1.2 mg via INTRAVENOUS
  Administered 2014-11-30: via INTRAVENOUS
  Administered 2014-11-30: 0.9 mg via INTRAVENOUS
  Administered 2014-11-30: 0.9 mL via INTRAVENOUS
  Administered 2014-12-01: 04:00:00 via INTRAVENOUS
  Administered 2014-12-01: 1.8 mg via INTRAVENOUS
  Administered 2014-12-01: 2.92 mg via INTRAVENOUS
  Administered 2014-12-01: 1.8 mg via INTRAVENOUS
  Administered 2014-12-01: 0.6 mg via INTRAVENOUS
  Filled 2014-11-28 (×3): qty 25

## 2014-11-28 MED ORDER — HYDROMORPHONE HCL 2 MG/ML IJ SOLN
2.0000 mg | Freq: Once | INTRAMUSCULAR | Status: AC
  Administered 2014-11-28: 2 mg via INTRAVENOUS
  Filled 2014-11-28: qty 1

## 2014-11-28 MED ORDER — HYDROXYUREA 500 MG PO CAPS
500.0000 mg | ORAL_CAPSULE | Freq: Two times a day (BID) | ORAL | Status: DC
  Administered 2014-11-28 – 2014-12-02 (×7): 500 mg via ORAL
  Filled 2014-11-28 (×10): qty 1

## 2014-11-28 MED ORDER — SODIUM CHLORIDE 0.9 % IJ SOLN
9.0000 mL | INTRAMUSCULAR | Status: DC | PRN
Start: 2014-11-28 — End: 2014-12-01
  Administered 2014-11-30: 9 mL via INTRAVENOUS
  Filled 2014-11-28: qty 9

## 2014-11-28 MED ORDER — POLYETHYLENE GLYCOL 3350 17 G PO PACK: 17.0000 g | PACK | Freq: Every day | ORAL | Status: DC | PRN

## 2014-11-28 MED ORDER — ONDANSETRON HCL 4 MG/2ML IJ SOLN
4.0000 mg | INTRAMUSCULAR | Status: DC | PRN
  Administered 2014-11-28 – 2014-12-01 (×6): 4 mg via INTRAVENOUS
  Filled 2014-11-28 (×6): qty 2

## 2014-11-28 MED ORDER — KETOROLAC TROMETHAMINE 30 MG/ML IJ SOLN
30.0000 mg | Freq: Four times a day (QID) | INTRAMUSCULAR | Status: DC
  Administered 2014-11-28 – 2014-12-01 (×9): 30 mg via INTRAVENOUS
  Filled 2014-11-28 (×16): qty 1

## 2014-11-28 MED ORDER — NALOXONE HCL 0.4 MG/ML IJ SOLN: 0.4000 mg | INTRAMUSCULAR | Status: DC | PRN

## 2014-11-28 MED ORDER — DIPHENHYDRAMINE HCL 50 MG/ML IJ SOLN: 12.5000 mg | Freq: Four times a day (QID) | INTRAMUSCULAR | Status: DC | PRN

## 2014-11-28 MED ORDER — SODIUM CHLORIDE 0.9 % IV SOLN
INTRAVENOUS | Status: DC
  Administered 2014-11-28 – 2014-11-29 (×2): via INTRAVENOUS
  Filled 2014-11-28 (×4): qty 1000

## 2014-11-28 MED ORDER — FOLIC ACID 1 MG PO TABS
1.0000 mg | ORAL_TABLET | Freq: Every day | ORAL | Status: DC
  Administered 2014-11-28 – 2014-12-02 (×4): 1 mg via ORAL
  Filled 2014-11-28 (×5): qty 1

## 2014-11-28 MED ORDER — ONDANSETRON HCL 4 MG/2ML IJ SOLN
4.0000 mg | Freq: Once | INTRAMUSCULAR | Status: AC
  Administered 2014-11-28: 4 mg via INTRAVENOUS
  Filled 2014-11-28: qty 2

## 2014-11-28 MED ORDER — SENNOSIDES-DOCUSATE SODIUM 8.6-50 MG PO TABS
1.0000 | ORAL_TABLET | Freq: Two times a day (BID) | ORAL | Status: DC
  Administered 2014-11-29 – 2014-12-02 (×6): 1 via ORAL
  Filled 2014-11-28 (×8): qty 1

## 2014-11-28 MED ORDER — ONDANSETRON HCL 4 MG PO TABS: 4.0000 mg | ORAL_TABLET | ORAL | Status: DC | PRN

## 2014-11-28 MED ORDER — ENOXAPARIN SODIUM 40 MG/0.4ML ~~LOC~~ SOLN
40.0000 mg | SUBCUTANEOUS | Status: DC
  Administered 2014-11-28 – 2014-12-01 (×3): 40 mg via SUBCUTANEOUS
  Filled 2014-11-28 (×5): qty 0.4

## 2014-11-28 NOTE — ED Notes (Signed)
Per EMS :pt in [facility] pain to upper body, pt is out of pain medication and states it hurts.

## 2014-11-28 NOTE — ED Notes (Signed)
Bed: [id number] Expected date:  Expected time:  Means of arrival:  Comments: Sickle cell

## 2014-11-28 NOTE — ED Provider Notes (Signed)
CSN: [id number]     Arrival date & time 11/28/14  1537 History   First MD Initiated Contact with Patient 11/28/14 1539     Chief Complaint  Patient presents with  . Sickle Cell Pain Crisis   HPI  Patient is a 24 year old female who presents emergency room for evaluation of pain in her upper chest and back. Patient states his pain has been going on for approximately a week. She states that she has some cough and associated shortness of breath. She does admit to some aching chest pain. She also states she has been bringing up mild yellow sputum. She denies fevers, chills, nausea, vomiting, but does admit to dry heaving. She denies diarrhea constipation urinary symptoms or vaginal symptoms. She was recently admitted to the hospital for sickle cell crisis last week and was discharged home. Patient has not followed up with Dr. Alyson Ingles as she will not be containing a CMP because he will not change her medications. Patient did have oxycodone that she was sent home with on 11/24/2014 that she said was working very well but she is now out of this medication. Patient has only tried Benadryl at home with little relief.  Past Medical History  Diagnosis Date  . Sickle cell crisis   . Depression   . Headache(784.0)   . Abortion     05/2012   Past Surgical History  Procedure Laterality Date  . Splenectomy    . Splenectomy, total     Family History  Problem Relation Age of Onset  . Hypertension Mother   . Sickle cell anemia Sister   . Kidney disease Sister   . Arthritis Sister   . Sickle cell anemia Sister   . Sickle cell trait Sister   . Heart disease Maternal Aunt   . Heart disease Maternal Uncle    History  Substance Use Topics  . Smoking status: Never Smoker   . Smokeless tobacco: Never Used  . Alcohol Use: No   OB History    Gravida Para Term Preterm AB TAB SAB Ectopic Multiple Living   1    1          Review of Systems  Constitutional: Negative for fever, chills and fatigue.   Respiratory: Positive for cough. Negative for chest tightness and shortness of breath.   Cardiovascular: Positive for chest pain. Negative for palpitations and leg swelling.  Gastrointestinal: Positive for nausea. Negative for vomiting, abdominal pain, diarrhea and constipation.  Genitourinary: Negative for dysuria, urgency, frequency, hematuria, difficulty urinating and dyspareunia.  All other systems reviewed and are negative.     Allergies  Other  Home Medications   Prior to Admission medications   Medication Sig Start Date End Date Taking? Authorizing Provider  cetirizine (ZYRTEC) 10 MG tablet Take 10 mg by mouth daily.    Yes Historical Provider, MD  diphenhydramine-acetaminophen (TYLENOL PM) 25-500 MG TABS Take 2 tablets by mouth as needed (pain/sleep).    Yes Historical Provider, MD  HYDROcodone-acetaminophen (NORCO) 10-325 MG per tablet Take 1 tablet by mouth every 6 (six) hours as needed (pain).  11/08/14  Yes Historical Provider, MD  hydroxyurea (HYDREA) 500 MG capsule Take 1 capsule (500 mg total) by mouth 2 (two) times daily. May take with food to minimize GI side effects. Patient not taking: Reported on 11/24/2014 11/24/14   Elwyn Reach, MD  oxyCODONE-acetaminophen (PERCOCET) 5-325 MG per tablet Take 1 tablet by mouth every 6 (six) hours as needed. Patient not taking: Reported on  11/28/2014 11/24/14   Ernestina Patches, MD   BP 107/72 mmHg  Pulse 74  Temp(Src) 99.1 F (37.3 C) (Oral)  Resp 24  SpO2 88%  LMP 11/28/2014 Physical Exam  Constitutional: She is oriented to person, place, and time. She appears well-developed and well-nourished. No distress.  HENT:  Head: Normocephalic and atraumatic.  Mouth/Throat: Oropharynx is clear and moist. No oropharyngeal exudate.  Eyes: Conjunctivae and EOM are normal. Pupils are equal, round, and reactive to light. No scleral icterus.  Neck: Normal range of motion. Neck supple. No JVD present. No thyromegaly present.   Cardiovascular: Normal rate, regular rhythm, normal heart sounds and intact distal pulses.  Exam reveals no gallop and no friction rub.   No murmur heard. Pulmonary/Chest: Effort normal and breath sounds normal. No respiratory distress. She has no wheezes. She has no rales. She exhibits no tenderness.  Abdominal: Soft. Bowel sounds are normal. She exhibits no distension and no mass. There is no tenderness. There is no rebound and no guarding.  Musculoskeletal: Normal range of motion.  Lymphadenopathy:    She has no cervical adenopathy.  Neurological: She is alert and oriented to person, place, and time. She has normal strength. No cranial nerve deficit or sensory deficit. Coordination normal.  Skin: Skin is warm and dry. She is not diaphoretic.  Psychiatric: She has a normal mood and affect. Her behavior is normal. Judgment and thought content normal.  Nursing note and vitals reviewed.   ED Course  Procedures (including critical care time) Labs Review Labs Reviewed  CBC WITH DIFFERENTIAL/PLATELET - Abnormal; Notable for the following:    WBC 12.1 (*)    RBC 2.67 (*)    Hemoglobin 7.9 (*)    HCT 22.6 (*)    RDW 19.9 (*)    Platelets 623 (*)    Monocytes Absolute 1.2 (*)    All other components within normal limits  COMPREHENSIVE METABOLIC PANEL - Abnormal; Notable for the following:    Potassium 2.8 (*)    ALT 53 (*)    Total Bilirubin 1.8 (*)    All other components within normal limits  URINALYSIS, ROUTINE W REFLEX MICROSCOPIC - Abnormal; Notable for the following:    APPearance CLOUDY (*)    Hgb urine dipstick MODERATE (*)    Leukocytes, UA TRACE (*)    All other components within normal limits  RETICULOCYTES - Abnormal; Notable for the following:    Retic Ct Pct 14.4 (*)    RBC. 2.67 (*)    Retic Count, Manual 384.5 (*)    All other components within normal limits  URINE MICROSCOPIC-ADD ON - Abnormal; Notable for the following:    Squamous Epithelial / LPF MANY (*)     Bacteria, UA MANY (*)    All other components within normal limits  I-STAT TROPOININ, ED  POC URINE PREG, ED    Imaging Review Dg Chest 2 View  11/28/2014   CLINICAL DATA:  Initial encounter for sickle cell pain crisis. Shortness of breath and cough.  EXAM: CHEST  2 VIEW  COMPARISON:  11/24/2014  FINDINGS: Lumbar spine endplate irregularity, consistent with the clinical history of sickle cell.  Midline trachea. Normal heart size and mediastinal contours. No pleural effusion or pneumothorax. mild lower lobe predominant interstitial thickening is chronic and nonspecific. Minimal volume loss at the right lung base. No lobar consolidation.  IMPRESSION: No acute cardiopulmonary disease.   Electronically Signed   By: Abigail Miyamoto M.D.   On: 11/28/2014 16:17  EKG Interpretation   Date/Time:  Thursday November 28 2014 15:41:51 EST Ventricular Rate:  70 PR Interval:  166 QRS Duration: 97 QT Interval:  423 QTC Calculation: 456 R Axis:   46 Text Interpretation:  Sinus rhythm RSR' in V1 or V2, probably normal  variant Sinus rhythm Artifact RSR' pattern in V1 Abnormal ekg Confirmed by  Carmin Muskrat  MD [phone]) on 11/28/2014 4:09:07 PM      MDM   Final diagnoses:  Sickle cell pain crisis  Hypokalemia   Patient is a 24 year old female who presents emergency room for evaluation of chest pain and back pain, patient states comes in with her regular sickle cell pain. Patient has had a cough with some yellow sputum production. Patient is afebrile here. Patient denies fevers at home. Chest x-ray is negative for acute infiltrates. I-STAT troponin is negative. EKG reveals a pattern in v1. reticulocyte count has increased from 8 to 14 in the last 4 days. CBC reveals mild decrease in hemoglobin. CMP reveals decrease in bilirubin 3.1 to 1.8. Patient is hypokalemic.  Potassium given here.  Urinalysis is contaminated. Urine pregnancy is negative. Suspect that patient may be having a sickle cell crisis.  Patient given 4 mg Dilaudid here in the ED without relief of her pain. I spoken with Dr. Carles Collet who will come to see the patient. Patient likely needs at least an observation admission for hydration and pain control. Patient discussed with Dr. Vanita Panda who agrees with the above workup and plan.    Cherylann Parr, PA-C 11/28/14 [place], MD 11/28/14 [phone]

## 2014-11-28 NOTE — H&P (Signed)
History and Physical  Carmen [name] UUV:[id number] DOB: 14-Jan-1991 DOA: 11/28/2014   PCP: [name], MD   Chief Complaint: chest pain and back pain  HPI:  24 year old female with a history of sickle cell disease, depression presents with her typical sickle cell pain manifested by chest discomfort and upper back pain. The patient was recently discharged from the hospital on 11/24/2014 after an admission for sickle cell crisis. She stated that she was not discharged with any opioids at that time. She actually came back to the emergency department at [facility] on 11/25/2014. She was discharged from the emergency department with Percocet. She states that she was given 10 Percocet and has taken all 10 without relief of her chest discomfort or back pain. The patient denies any recent injuries. She also complains of cough with yellow sputum. She denies any hemoptysis. She had some subjective fevers and chills at home but has been afebrile since admission. She complains of some nausea, but have one episode of emesis in the emergency department. She denies any dysuria, hematuria, diarrhea, hematochezia, melena, vaginal discharge. In the emergency department, the patient was afebrile and hemodynamically stable. Oxygen saturation was 99% on room air. Labs showed WBC 12.1, hemoglobin 7.9, reticulocyte count 14.4%. Potassium was 2.8. Hepatic enzymes were mildly elevated with AST 33, ALT 53, alk phosphatase 67, total bilirubin 1.8. Chest x-ray was unremarkable. EKG showed sinus rhythm without concerning ST-T wave changes.  The patient received 4 mg Dilaudid in the emergency department without much relief although the patient was resting comfortably when I arrived. Urine pregnancy test was negative   Assessment/Plan: Sickle cell pain crisis  -Start the patient on a Dilaudid PCA pump --0.57m q 121m lock out -IV Toradol  -IV fluids  -Check LDH in morning  -Check magnesium  -Urine drug screen    -restart hydrea Hypokalemia  -Replete and check magnesium Nausea and vomiting  -Check lipase  -Patient did not have any vomiting prior to intravenous opioids  -RUQ USKorealeukocytosis and thrombocytosis  -Likely acute phase reactant secondary to patient's sickle cell crisis  Atypical Chest pain  -Typical of the patient's sickle cell crisis in the past  -EKG without any concerning ST-T wave changes  -Cycle troponins  -Reproducible on physical exam        Past Medical History  Diagnosis Date  . Sickle cell crisis   . Depression   . Headache(784.0)   . Abortion     05/2012   Past Surgical History  Procedure Laterality Date  . Splenectomy    . Splenectomy, total     Social History:  reports that she has never smoked. She has never used smokeless tobacco. She reports that she does not drink alcohol or use illicit drugs.   Family History  Problem Relation Age of Onset  . Hypertension Mother   . Sickle cell anemia Sister   . Kidney disease Sister   . Arthritis Sister   . Sickle cell anemia Sister   . Sickle cell trait Sister   . Heart disease Maternal Aunt   . Heart disease Maternal Uncle      Allergies  Allergen Reactions  . Other Nausea Only    Patient stated that all iv pain medication make her have really bad nausea      Prior to Admission medications   Medication Sig Start Date End Date Taking? Authorizing Provider  cetirizine (ZYRTEC) 10 MG tablet Take 10 mg by mouth daily.  Yes Historical Provider, MD  diphenhydramine-acetaminophen (TYLENOL PM) 25-500 MG TABS Take 2 tablets by mouth as needed (pain/sleep).    Yes Historical Provider, MD  HYDROcodone-acetaminophen (NORCO) 10-325 MG per tablet Take 1 tablet by mouth every 6 (six) hours as needed (pain).  11/08/14  Yes Historical Provider, MD  hydroxyurea (HYDREA) 500 MG capsule Take 1 capsule (500 mg total) by mouth 2 (two) times daily. May take with food to minimize GI side effects. Patient not taking:  Reported on 11/24/2014 11/24/14   Elwyn Reach, MD  oxyCODONE-acetaminophen (PERCOCET) 5-325 MG per tablet Take 1 tablet by mouth every 6 (six) hours as needed. Patient not taking: Reported on 11/28/2014 11/24/14   Ernestina Patches, MD    Review of Systems:  Constitutional:  No weight loss, night sweats Head&Eyes: No headache.  No vision loss.  No eye pain or scotoma ENT:  No Difficulty swallowing,Tooth/dental problems,Sore throat,   Cardio-vascular:  No Orthopnea, PND, swelling in lower extremities,  dizziness, palpitations  GI:  No diarrhea, loss of appetite, hematochezia, melena, heartburn, indigestion, Resp:  No shortness of breath with exertion or at rest.No coughing up of blood .No wheezing.No chest wall deformity  Skin:  no rash or lesions.  GU:  no dysuria, change in color of urine, no urgency or frequency. No flank pain.  Musculoskeletal:  No joint pain or swelling. No decreased range of motion.  Psych:  No change in mood or affect.  Neurologic: No headache, no dysesthesia, no focal weakness, no vision loss. No syncope  Physical Exam: Filed Vitals:   11/28/14 1538 11/28/14 1700 11/28/14 1800 11/28/14 1900  BP: 131/63 127/74 134/71 107/72  Pulse: 69 83 82 74  Temp: 99.1 F (37.3 C)     TempSrc: Oral     Resp: _0 SpO2: 99% 97% 95% 88%   General:  A&O x 3, NAD, nontoxic, pleasant/cooperative Head/Eye: No conjunctival hemorrhage, no icterus, [place]/AT, No nystagmus ENT:  No icterus,  No thrush, good dentition, no pharyngeal exudate Neck:  No masses, no lymphadenpathy, no bruits CV:  RRR, no rub, no gallop, no S3; chest pain reproducible on palpation Lung:  CTAB, good air movement, no wheeze, no rhonchi Abdomen: soft/NT, +BS, nondistended, no peritoneal signs Ext: No cyanosis, No rashes, No petechiae, No lymphangitis, No edema  Labs on Admission:  Basic Metabolic Panel:  Recent Labs Lab 11/22/14 1824 11/22/14 2109 11/23/14 0410 11/24/14 0425  11/24/14 1732 11/28/14 1617  NA 137 140 137 136 134* 139  K 3.5 3.8 3.8 3.4* 3.2* 2.8*  CL 105 107 108 105 102 109  CO2 20  --  _1 GLUCOSE 89 91 108* 111* 86 93  BUN _2 5* <5* 6  CREATININE 0.78 0.70 0.58 0.44* 0.55 0.50  CALCIUM 9.6  --  8.9 8.9 9.4 9.0  MG 2.1  --   --   --   --   --    Liver Function Tests:  Recent Labs Lab 11/22/14 1824 11/24/14 0425 11/28/14 1617  AST 48* 121* 33  ALT 40* 114* 53*  ALKPHOS 62 59 67  BILITOT 1.9* 3.1* 1.8*  PROT 8.7* 7.2 7.5  ALBUMIN 4.8 3.6 3.9   No results for input(s): LIPASE, AMYLASE in the last 168 hours. No results for input(s): AMMONIA in the last 168 hours. CBC:  Recent Labs Lab 11/22/14 1824 11/22/14 2109 11/23/14 0410 11/24/14 0425 11/24/14 1732 11/28/14 1617  WBC 11.9*  --  12.2* 8.9 11.9* 12.1*  NEUTROABS 7.8*  --  8.2* 4.3 6.2 7.4  HGB 9.3* 9.9* 7.9* 7.7* 8.7* 7.9*  HCT 25.9* 29.0* 22.1* 21.2* 23.7* 22.6*  MCV 82.2  --  82.5 80.3 77.7* 84.6  PLT 592*  --  498* 486* 560* 623*   Cardiac Enzymes: No results for input(s): CKTOTAL, CKMB, CKMBINDEX, TROPONINI in the last 168 hours. BNP: Invalid input(s): POCBNP CBG: No results for input(s): GLUCAP in the last 168 hours.  Radiological Exams on Admission: Dg Chest 2 View  11/28/2014   CLINICAL DATA:  Initial encounter for sickle cell pain crisis. Shortness of breath and cough.  EXAM: CHEST  2 VIEW  COMPARISON:  11/24/2014  FINDINGS: Lumbar spine endplate irregularity, consistent with the clinical history of sickle cell.  Midline trachea. Normal heart size and mediastinal contours. No pleural effusion or pneumothorax. mild lower lobe predominant interstitial thickening is chronic and nonspecific. Minimal volume loss at the right lung base. No lobar consolidation.  IMPRESSION: No acute cardiopulmonary disease.   Electronically Signed   By: Abigail Miyamoto M.D.   On: 11/28/2014 16:17    EKG: Independently reviewed. Sinus rhythm, no ST-T wave changes Time  spent:60 minutes Code Status:   FULL Family Communication:  No Family at bedside   Deandra Gadson, DO  [facility] Pager [phone]  If 7PM-7AM, please contact night-coverage [email or web address] Password [facility] 11/28/2014, 8:06 PM

## 2014-11-29 ENCOUNTER — Inpatient Hospital Stay (HOSPITAL_COMMUNITY): Payer: Medicaid Other

## 2014-11-29 DIAGNOSIS — K8000 Calculus of gallbladder with acute cholecystitis without obstruction: Secondary | ICD-10-CM

## 2014-11-29 LAB — COMPREHENSIVE METABOLIC PANEL
ALT: 41 U/L — ABNORMAL HIGH (ref 0–35)
AST: 25 U/L (ref 0–37)
Albumin: 3.5 g/dL (ref 3.5–5.2)
Alkaline Phosphatase: 60 U/L (ref 39–117)
Anion gap: 7 (ref 5–15)
BUN: 6 mg/dL (ref 6–23)
CALCIUM: 8.5 mg/dL (ref 8.4–10.5)
CO2: 24 mmol/L (ref 19–32)
Chloride: 111 mmol/L (ref 96–112)
Creatinine, Ser: 0.49 mg/dL — ABNORMAL LOW (ref 0.50–1.10)
Glucose, Bld: 92 mg/dL (ref 70–99)
Potassium: 3.6 mmol/L (ref 3.5–5.1)
SODIUM: 142 mmol/L (ref 135–145)
Total Bilirubin: 1.7 mg/dL — ABNORMAL HIGH (ref 0.3–1.2)
Total Protein: 6.5 g/dL (ref 6.0–8.3)

## 2014-11-29 LAB — RETICULOCYTES
RBC.: 2.29 MIL/uL — ABNORMAL LOW (ref 3.87–5.11)
Retic Count, Absolute: 361.8 K/uL — ABNORMAL HIGH (ref 19.0–186.0)
Retic Ct Pct: 15.8 % — ABNORMAL HIGH (ref 0.4–3.1)

## 2014-11-29 LAB — TROPONIN I
Troponin I: <0.03 ng/mL (ref <0.031)
Troponin I: <0.03 ng/mL (ref <0.031)

## 2014-11-29 LAB — PROTIME-INR
INR: 1.21 (ref 0.00–1.49)
PROTHROMBIN TIME: 15.4 s — AB (ref 11.6–15.2)

## 2014-11-29 LAB — APTT: aPTT: 37 seconds (ref 24–37)

## 2014-11-29 LAB — MAGNESIUM: Magnesium: 1.8 mg/dL (ref 1.5–2.5)

## 2014-11-29 LAB — LACTATE DEHYDROGENASE: LDH: 225 U/L (ref 94–250)

## 2014-11-29 MED ORDER — METOCLOPRAMIDE HCL 5 MG/ML IJ SOLN
5.0000 mg | Freq: Once | INTRAMUSCULAR | Status: AC | PRN
Start: 2014-11-29 — End: 2014-11-29
  Administered 2014-11-29: 5 mg via INTRAVENOUS
  Filled 2014-11-29: qty 2

## 2014-11-29 MED ORDER — PIPERACILLIN-TAZOBACTAM 3.375 G IVPB: 3.3750 g | Freq: Four times a day (QID) | INTRAVENOUS | Status: DC

## 2014-11-29 MED ORDER — PIPERACILLIN-TAZOBACTAM 3.375 G IVPB
3.3750 g | Freq: Three times a day (TID) | INTRAVENOUS | Status: DC
  Administered 2014-11-29 – 2014-11-30 (×2): 3.375 g via INTRAVENOUS
  Filled 2014-11-29 (×2): qty 50

## 2014-11-29 MED ORDER — HYDROXYZINE HCL 50 MG/ML IM SOLN
25.0000 mg | Freq: Once | INTRAMUSCULAR | Status: AC | PRN
  Filled 2014-11-29: qty 0.5

## 2014-11-29 MED ORDER — DEXTROSE-NACL 5-0.45 % IV SOLN
INTRAVENOUS | Status: DC
  Administered 2014-11-29 – 2014-11-30 (×3): via INTRAVENOUS

## 2014-11-29 NOTE — Progress Notes (Signed)
SICKLE CELL SERVICE PROGRESS NOTE  Carmen [name] ERD:[id number] DOB: 05-28-1991 DOA: 11/28/2014 PCP: [name], MD   Presenting HPI: 24 year old female with a history of sickle cell disease, depression presents with her typical sickle cell pain manifested by chest discomfort and upper back pain. The patient was recently discharged from the hospital on 11/24/2014 after an admission for sickle cell crisis. She stated that she was not discharged with any opioids at that time. She actually came back to the emergency department at [facility] on 11/25/2014. She was discharged from the emergency department with Percocet. She states that she was given 10 Percocet and has taken all 10 without relief of her chest discomfort or back pain. The patient denies any recent injuries. She also complains of cough with yellow sputum. She denies any hemoptysis. She had some subjective fevers and chills at home but has been afebrile since admission. She complains of some nausea, but have one episode of emesis in the emergency department. She denies any dysuria, hematuria, diarrhea, hematochezia, melena, vaginal discharge. In the emergency department, the patient was afebrile and hemodynamically stable. Oxygen saturation was 99% on room air. Labs showed WBC 12.1, hemoglobin 7.9, reticulocyte count 14.4%. Potassium was 2.8. Hepatic enzymes were mildly elevated with AST 33, ALT 53, alk phosphatase 67, total bilirubin 1.8. Chest x-ray was unremarkable. EKG showed sinus rhythm without concerning ST-T wave changes. The patient received 4 mg Dilaudid in the emergency department without much relief although the patient was resting comfortably when I arrived. Urine pregnancy test was negative  Consultants:  Gen Surg  Procedures:  none  Antibiotics:  Zosyn 1/29-->  HPI/Subjective: Pt states her pain in her legs, back, abdomen, and chest are at about a 7/10. She still has abdominal pain that has not gotten better. She  has a slight cough productive of yellow sputum but no fever. She denies any sick contacts.  Objective: Filed Vitals:   11/29/14 1000 11/29/14 1220 11/29/14 1500 11/29/14 1736  BP: 120/60  121/75   Pulse: 88  91   Temp: 98.8 F (37.1 C)  99.1 F (37.3 C)   TempSrc: Oral  Oral   Resp: _0 Height:      Weight:      SpO2: 98% 99% 100% 97%   Weight change:   Intake/Output Summary (Last 24 hours) at 11/29/14 1904 Last data filed at 11/29/14 1502  Gross per 24 hour  Intake    240 ml  Output   1000 ml  Net   -760 ml    General: Alert, awake, oriented x3, in no acute distress.  HEENT: [place]/AT PEERL, EOMI Neck: Trachea midline,  no masses, no thyromegal,y no JVD, no carotid bruit OROPHARYNX:  Moist, No exudate/ erythema/lesions.  Heart: Regular rate and rhythm, without murmurs, rubs, gallops. Lungs: Clear to auscultation, no wheezing or rhonchi noted.  Abdomen: Soft, tender in RUQ maximally and diffusely through out, positive bowel sounds, no masses no hepatosplenomegaly noted. Neuro: No focal neurological deficits noted cranial nerves II through XII grossly intact. Strength 5 out of 5 in bilateral upper and lower extremities. Musculoskeletal: No warm swelling or erythema around joints, no spinal tenderness noted. Psychiatric: Patient alert and oriented x3, good insight and cognition, good recent to remote recall. Lymph node survey: No cervical lymphadenopathy noted. Extremities: no c/c/e   Data Reviewed: Basic Metabolic Panel:  Recent Labs Lab 11/23/14 0410 11/24/14 0425 11/24/14 1732 11/28/14 1617 11/29/14 0440  NA 137 136 134* 139 142  K 3.8 3.4* 3.2* 2.8* 3.6  CL 108 105 102 109 111  CO2 _0 GLUCOSE 108* 111* 86 93 92  BUN 7 5* <5* 6 6  CREATININE 0.58 0.44* 0.55 0.50 0.49*  CALCIUM 8.9 8.9 9.4 9.0 8.5  MG  --   --   --   --  1.8   Liver Function Tests:  Recent Labs Lab 11/24/14 0425 11/28/14 1617 11/29/14 0440  AST 121* 33 25  ALT 114*  53* 41*  ALKPHOS 59 67 60  BILITOT 3.1* 1.8* 1.7*  PROT 7.2 7.5 6.5  ALBUMIN 3.6 3.9 3.5   No results for input(s): LIPASE, AMYLASE in the last 168 hours. No results for input(s): AMMONIA in the last 168 hours. CBC:  Recent Labs Lab 11/23/14 0410 11/24/14 0425 11/24/14 1732 11/28/14 1617 11/28/14 2112  WBC 12.2* 8.9 11.9* 12.1* 12.9*  NEUTROABS 8.2* 4.3 6.2 7.4 8.0*  HGB 7.9* 7.7* 8.7* 7.9* 7.0*  HCT 22.1* 21.2* 23.7* 22.6* 19.9*  MCV 82.5 80.3 77.7* 84.6 85.0  PLT 498* 486* 560* 623* 515*   Cardiac Enzymes:  Recent Labs Lab 11/28/14 2112 11/29/14 0440 11/29/14 1113  TROPONINI <0.03 <0.03 <0.03   BNP (last 3 results) No results for input(s): PROBNP in the last 8760 hours. CBG: No results for input(s): GLUCAP in the last 168 hours.  No results found for this or any previous visit (from the past 240 hour(s)).   Studies: Dg Chest 2 View  11/28/2014   CLINICAL DATA:  Initial encounter for sickle cell pain crisis. Shortness of breath and cough.  EXAM: CHEST  2 VIEW  COMPARISON:  11/24/2014  FINDINGS: Lumbar spine endplate irregularity, consistent with the clinical history of sickle cell.  Midline trachea. Normal heart size and mediastinal contours. No pleural effusion or pneumothorax. mild lower lobe predominant interstitial thickening is chronic and nonspecific. Minimal volume loss at the right lung base. No lobar consolidation.  IMPRESSION: No acute cardiopulmonary disease.   Electronically Signed   By: Abigail Miyamoto M.D.   On: 11/28/2014 16:17   Dg Chest 2 View  11/24/2014   CLINICAL DATA:  Sickle cell crisis for 2 days.  EXAM: CHEST  2 VIEW  COMPARISON:  04/03/2014  FINDINGS: Normal heart, mediastinum hila. Clear lungs. No pleural effusion or pneumothorax. Bony thorax is unremarkable.  IMPRESSION: Normal chest radiographs.   Electronically Signed   By: Lajean Manes M.D.   On: 11/24/2014 17:46   US Abdomen Limited Ruq  11/29/2014   CLINICAL DATA:  Abdominal pain and  elevated LFTs.  EXAM: US ABDOMEN LIMITED - RIGHT UPPER QUADRANT  COMPARISON:  None.  FINDINGS: Gallbladder:  Gallbladder wall is markedly thickened measuring 13.1 mm. There are multiple stones within the gallbladder which measure up to 1 cm. (Note: Patient on morphine pump and technologist could not reliably assess for sonographic Murphy's sign. Never the less, sonographer reports tenderness in the right upper quadrant).  Common bile duct:  Diameter: 3.1 mm.  Liver:  No focal liver abnormality. Mild intrahepatic biliary prominence. No focal liver abnormality.  Other:  Small right effusion noted.  IMPRESSION: 1. Gallstones and gallbladder wall thickening. Findings are concerning for cholecystitis.   Electronically Signed   By: Kerby Moors M.D.   On: 11/29/2014 09:43    Scheduled Meds: . enoxaparin (LOVENOX) injection  40 mg Subcutaneous Q24H  . folic acid  1 mg Oral Daily  . HYDROmorphone PCA 0.3 mg/mL   Intravenous 6 times  per day  . hydroxyurea  500 mg Oral BID  . ketorolac  30 mg Intravenous 4 times per day  . piperacillin-tazobactam (ZOSYN)  IV  3.375 g Intravenous Q8H  . senna-docusate  1 tablet Oral BID   Continuous Infusions: . sodium chloride 0.9 % 1,000 mL with potassium chloride 20 mEq infusion 100 mL/hr at 11/29/14 1726    Active Problems:   Sickle cell crisis   Leukocytosis   Thrombocytosis   Hypokalemia   Atypical chest pain   Assessment/Plan: Active Problems:   Sickle cell crisis   Leukocytosis   Thrombocytosis   Hypokalemia   Atypical chest pain  1)Sickle Cell Crisis: Pt presented with pain consistent with VOC. Pt's pain is an 8/10. Continue Dilaudid PCA.  Will Continue Toradol.  2)Cholecystitis: Pt's symptoms and [place] are consistent with cholecystitis. She was started on Zosyn empirically, day 1. General surgery was consulted and may remove gall bladder tomorrow.  3) Anemia: Hgb stable at 7.0. Will continue to monitor. Transfuse if Hgb less than 5.5. May possibly  need transfusion if going to get surgery. 4)Sickle Cell Care: Continue Folic acid and hydroxyurea.   5) FEN/GI : NPO after midnight.  IV fluids per protocol-D5/0.45% _0 /hr Bowel regimen in place   Code Status: Full  DVT Prophylaxis: enoxaparin  Family Communication: none  Disposition Plan: none  Time spent:40 minutes  Kalman Shan  Pager [phone]. If 7PM-7AM, please contact night-coverage.  11/29/2014, 7:04 PM  LOS: 1 day   Kalman Shan

## 2014-11-29 NOTE — Consult Note (Signed)
Reason for Consult:  Cholelithiasis/cholecystitis Referring Physician: Dr. [name] is an 24 y.o. female.  HPI: 24 y/o who presents on 11/28/14 with Sickle cell pain crisis.  She has had 3 days of pain in her upper chest and upper back.  She was just discharged after going thru Crisis on 1/22 - 11/24/14.  Pain was also back and chest.  She continues to have pain in her back and chest, but woke up yesterday with new pain in the mid epigastric and RUQ sites.  Work up in ED shows T bil up some.  WBC [place] yo at 12.9. Possible UTI.  Abdominal US shows GB wall thickening 13.1 mm, multiple stone up to 1 cm.  CBD 3.1 mm.  She is extremely tender in the RUQ, and this is new for her.  Past Medical History  Diagnosis Date  . Sickle cell crisis   . Depression   . Headache(784.0)   . Abortion     05/2012    Past Surgical History  Procedure Laterality Date  . Splenectomy    . Splenectomy, total      Family History  Problem Relation Age of Onset  . Hypertension Mother   . Sickle cell anemia Sister   . Kidney disease Sister   . Arthritis Sister   . Sickle cell anemia Sister   . Sickle cell trait Sister   . Heart disease Maternal Aunt   . Heart disease Maternal Uncle     Social History:  reports that she has never smoked. She has never used smokeless tobacco. She reports that she does not drink alcohol or use illicit drugs.  Allergies:  Allergies  Allergen Reactions  . Other Nausea Only    Patient stated that all iv pain medication make her have really bad nausea    Medications:  Prior to Admission:  Prescriptions prior to admission  Medication Sig Dispense Refill Last Dose  . cetirizine (ZYRTEC) 10 MG tablet Take 10 mg by mouth daily.    11/27/2014 at Unknown time  . diphenhydramine-acetaminophen (TYLENOL PM) 25-500 MG TABS Take 2 tablets by mouth as needed (pain/sleep).    11/21/2014  . HYDROcodone-acetaminophen (NORCO) 10-325 MG per tablet Take 1 tablet by mouth every 6  (six) hours as needed (pain).   0 2 weeks  . hydroxyurea (HYDREA) 500 MG capsule Take 1 capsule (500 mg total) by mouth 2 (two) times daily. May take with food to minimize GI side effects. (Patient not taking: Reported on 11/24/2014) 60 capsule 0 Not Taking at Unknown time  . oxyCODONE-acetaminophen (PERCOCET) 5-325 MG per tablet Take 1 tablet by mouth every 6 (six) hours as needed. (Patient not taking: Reported on 11/28/2014) 10 tablet 0    Scheduled: . enoxaparin (LOVENOX) injection  40 mg Subcutaneous Q24H  . folic acid  1 mg Oral Daily  . HYDROmorphone PCA 0.3 mg/mL   Intravenous 6 times per day  . hydroxyurea  500 mg Oral BID  . ketorolac  30 mg Intravenous 4 times per day  . piperacillin-tazobactam (ZOSYN)  IV  3.375 g Intravenous Q8H  . senna-docusate  1 tablet Oral BID   Continuous: . sodium chloride 0.9 % 1,000 mL with potassium chloride 20 mEq infusion 100 mL/hr at 11/28/14 2130   PRN:[DISCONTINUED] diphenhydrAMINE **OR** diphenhydrAMINE, hydrOXYzine, naloxone **AND** sodium chloride, ondansetron **OR** ondansetron (ZOFRAN) IV, polyethylene glycol Anti-infectives    Start     Dose/Rate Route Frequency Ordered Stop   11/29/14 1600  piperacillin-tazobactam (  ZOSYN) IVPB 3.375 g     3.375 g12.5 mL/hr over 240 Minutes Intravenous Every 8 hours 11/29/14 1402     11/29/14 1415  piperacillin-tazobactam (ZOSYN) IVPB 3.375 g  Status:  Discontinued     3.375 g12.5 mL/hr over 240 Minutes Intravenous 4 times per day 11/29/14 1400 11/29/14 1401      Results for orders placed or performed during the hospital encounter of 11/28/14 (from the past 48 hour(s))  CBC with Differential     Status: Abnormal   Collection Time: 11/28/14  4:17 PM  Result Value Ref Range   WBC 12.1 (H) 4.0 - 10.5 K/uL    Comment: ADJUSTED FOR NUCLEATED RBC'S   RBC 2.67 (L) 3.87 - 5.11 MIL/uL   Hemoglobin 7.9 (L) 12.0 - 15.0 g/dL   HCT 22.6 (L) 36.0 - 46.0 %   MCV 84.6 78.0 - 100.0 fL   MCH 29.6 26.0 - 34.0 pg    MCHC 35.0 30.0 - 36.0 g/dL   RDW 19.9 (H) 11.5 - 15.5 %   Platelets 623 (H) 150 - 400 K/uL   Neutrophils Relative % 61 43 - 77 %   Lymphocytes Relative 28 12 - 46 %   Monocytes Relative 10 3 - 12 %   Eosinophils Relative 1 0 - 5 %   Basophils Relative 0 0 - 1 %   Neutro Abs 7.4 1.7 - 7.7 K/uL   Lymphs Abs 3.4 0.7 - 4.0 K/uL   Monocytes Absolute 1.2 (H) 0.1 - 1.0 K/uL   Eosinophils Absolute 0.1 0.0 - 0.7 K/uL   Basophils Absolute 0.0 0.0 - 0.1 K/uL   RBC Morphology POLYCHROMASIA PRESENT     Comment: HOWELL/JOLLY BODIES SICKLE CELLS TARGET CELLS   Comprehensive metabolic panel     Status: Abnormal   Collection Time: 11/28/14  4:17 PM  Result Value Ref Range   Sodium 139 135 - 145 mmol/L   Potassium 2.8 (L) 3.5 - 5.1 mmol/L   Chloride 109 96 - 112 mmol/L   CO2 24 19 - 32 mmol/L   Glucose, Bld 93 70 - 99 mg/dL   BUN 6 6 - 23 mg/dL   Creatinine, Ser 0.50 0.50 - 1.10 mg/dL   Calcium 9.0 8.4 - 10.5 mg/dL   Total Protein 7.5 6.0 - 8.3 g/dL   Albumin 3.9 3.5 - 5.2 g/dL   AST 33 0 - 37 U/L   ALT 53 (H) 0 - 35 U/L   Alkaline Phosphatase 67 39 - 117 U/L   Total Bilirubin 1.8 (H) 0.3 - 1.2 mg/dL   GFR calc non Af Amer >90 >90 mL/min   GFR calc Af Amer >90 >90 mL/min    Comment: (NOTE) The eGFR has been calculated using the CKD EPI equation. This calculation has not been validated in all clinical situations. eGFR's persistently <90 mL/min signify possible Chronic Kidney Disease.    Anion gap 6 5 - 15  Reticulocytes     Status: Abnormal   Collection Time: 11/28/14  4:17 PM  Result Value Ref Range   Retic Ct Pct 14.4 (H) 0.4 - 3.1 %   RBC. 2.67 (L) 3.87 - 5.11 MIL/uL   Retic Count, Manual 384.5 (H) 19.0 - 186.0 K/uL  I-stat troponin, ED     Status: None   Collection Time: 11/28/14  4:23 PM  Result Value Ref Range   Troponin i, poc 0.00 0.00 - 0.08 ng/mL   Comment 3  Comment: Due to the release kinetics of cTnI, a negative result within the first hours of the onset  of symptoms does not rule out myocardial infarction with certainty. If myocardial infarction is still suspected, repeat the test at appropriate intervals.   Urinalysis, Routine w reflex microscopic     Status: Abnormal   Collection Time: 11/28/14  4:58 PM  Result Value Ref Range   Color, Urine YELLOW YELLOW   APPearance CLOUDY (A) CLEAR   Specific Gravity, Urine 1.012 1.005 - 1.030   pH 6.5 5.0 - 8.0   Glucose, UA NEGATIVE NEGATIVE mg/dL   Hgb urine dipstick MODERATE (A) NEGATIVE   Bilirubin Urine NEGATIVE NEGATIVE   Ketones, ur NEGATIVE NEGATIVE mg/dL   Protein, ur NEGATIVE NEGATIVE mg/dL   Urobilinogen, UA 1.0 0.0 - 1.0 mg/dL   Nitrite NEGATIVE NEGATIVE   Leukocytes, UA TRACE (A) NEGATIVE  Urine microscopic-add on     Status: Abnormal   Collection Time: 11/28/14  4:58 PM  Result Value Ref Range   Squamous Epithelial / LPF MANY (A) RARE   WBC, UA 7-10 <3 WBC/hpf   RBC / HPF 3-6 <3 RBC/hpf   Bacteria, UA MANY (A) RARE   Urine-Other MUCOUS PRESENT   POC Urine Pregnancy, ED (do NOT order at [facility])     Status: None   Collection Time: 11/28/14  5:04 PM  Result Value Ref Range   Preg Test, Ur NEGATIVE NEGATIVE    Comment:        THE SENSITIVITY OF THIS METHODOLOGY IS >24 mIU/mL   CBC WITH DIFFERENTIAL     Status: Abnormal   Collection Time: 11/28/14  9:12 PM  Result Value Ref Range   WBC 12.9 (H) 4.0 - 10.5 K/uL    Comment: WHITE COUNT CONFIRMED ON SMEAR ADJUSTED FOR NUCLEATED RBC'S    RBC 2.34 (L) 3.87 - 5.11 MIL/uL   Hemoglobin 7.0 (L) 12.0 - 15.0 g/dL   HCT 19.9 (L) 36.0 - 46.0 %   MCV 85.0 78.0 - 100.0 fL   MCH 29.9 26.0 - 34.0 pg   MCHC 35.2 30.0 - 36.0 g/dL   RDW 21.0 (H) 11.5 - 15.5 %   Platelets 515 (H) 150 - 400 K/uL   Neutrophils Relative % 62 43 - 77 %   Lymphocytes Relative 28 12 - 46 %   Monocytes Relative 9 3 - 12 %   Eosinophils Relative 1 0 - 5 %   Basophils Relative 0 0 - 1 %   Neutro Abs 8.0 (H) 1.7 - 7.7 K/uL   Lymphs Abs 3.6 0.7 - 4.0 K/uL    Monocytes Absolute 1.2 (H) 0.1 - 1.0 K/uL   Eosinophils Absolute 0.1 0.0 - 0.7 K/uL   Basophils Absolute 0.0 0.0 - 0.1 K/uL   RBC Morphology SICKLE CELLS     Comment: TARGET CELLS MARKED POLYCHROMASIA   Troponin I (q 6hr x 3)     Status: None   Collection Time: 11/28/14  9:12 PM  Result Value Ref Range   Troponin I <0.03 <0.031 ng/mL    Comment:        NO INDICATION OF MYOCARDIAL INJURY.   Comprehensive metabolic panel     Status: Abnormal   Collection Time: 11/29/14  4:40 AM  Result Value Ref Range   Sodium 142 135 - 145 mmol/L   Potassium 3.6 3.5 - 5.1 mmol/L    Comment: DELTA CHECK NOTED REPEATED TO VERIFY    Chloride 111 96 - 112 mmol/L  CO2 24 19 - 32 mmol/L   Glucose, Bld 92 70 - 99 mg/dL   BUN 6 6 - 23 mg/dL   Creatinine, Ser 0.49 (L) 0.50 - 1.10 mg/dL   Calcium 8.5 8.4 - 10.5 mg/dL   Total Protein 6.5 6.0 - 8.3 g/dL   Albumin 3.5 3.5 - 5.2 g/dL   AST 25 0 - 37 U/L   ALT 41 (H) 0 - 35 U/L   Alkaline Phosphatase 60 39 - 117 U/L   Total Bilirubin 1.7 (H) 0.3 - 1.2 mg/dL   GFR calc non Af Amer >90 >90 mL/min   GFR calc Af Amer >90 >90 mL/min    Comment: (NOTE) The eGFR has been calculated using the CKD EPI equation. This calculation has not been validated in all clinical situations. eGFR's persistently <90 mL/min signify possible Chronic Kidney Disease.    Anion gap 7 5 - 15  Lactate dehydrogenase     Status: None   Collection Time: 11/29/14  4:40 AM  Result Value Ref Range   LDH 225 94 - 250 U/L  Magnesium     Status: None   Collection Time: 11/29/14  4:40 AM  Result Value Ref Range   Magnesium 1.8 1.5 - 2.5 mg/dL  Reticulocytes     Status: Abnormal   Collection Time: 11/29/14  4:40 AM  Result Value Ref Range   Retic Ct Pct 15.8 (H) 0.4 - 3.1 %   RBC. 2.29 (L) 3.87 - 5.11 MIL/uL   Retic Count, Manual 361.8 (H) 19.0 - 186.0 K/uL  Troponin I (q 6hr x 3)     Status: None   Collection Time: 11/29/14  4:40 AM  Result Value Ref Range   Troponin I <0.03  <0.031 ng/mL    Comment:        NO INDICATION OF MYOCARDIAL INJURY.   Troponin I (q 6hr x 3)     Status: None   Collection Time: 11/29/14 11:13 AM  Result Value Ref Range   Troponin I <0.03 <0.031 ng/mL    Comment:        NO INDICATION OF MYOCARDIAL INJURY.     Dg Chest 2 View  11/28/2014   CLINICAL DATA:  Initial encounter for sickle cell pain crisis. Shortness of breath and cough.  EXAM: CHEST  2 VIEW  COMPARISON:  11/24/2014  FINDINGS: Lumbar spine endplate irregularity, consistent with the clinical history of sickle cell.  Midline trachea. Normal heart size and mediastinal contours. No pleural effusion or pneumothorax. mild lower lobe predominant interstitial thickening is chronic and nonspecific. Minimal volume loss at the right lung base. No lobar consolidation.  IMPRESSION: No acute cardiopulmonary disease.   Electronically Signed   By: Abigail Miyamoto M.D.   On: 11/28/2014 16:17   US Abdomen Limited Ruq  11/29/2014   CLINICAL DATA:  Abdominal pain and elevated LFTs.  EXAM: US ABDOMEN LIMITED - RIGHT UPPER QUADRANT  COMPARISON:  None.  FINDINGS: Gallbladder:  Gallbladder wall is markedly thickened measuring 13.1 mm. There are multiple stones within the gallbladder which measure up to 1 cm. (Note: Patient on morphine pump and technologist could not reliably assess for sonographic Murphy's sign. Never the less, sonographer reports tenderness in the right upper quadrant).  Common bile duct:  Diameter: 3.1 mm.  Liver:  No focal liver abnormality. Mild intrahepatic biliary prominence. No focal liver abnormality.  Other:  Small right effusion noted.  IMPRESSION: 1. Gallstones and gallbladder wall thickening. Findings are concerning for cholecystitis.  Electronically Signed   By: Kerby Moors M.D.   On: 11/29/2014 09:43    Review of Systems  Constitutional: Negative.   HENT: Negative.   Respiratory: Negative.   Cardiovascular: Negative.   Gastrointestinal: Positive for heartburn, nausea,  vomiting and abdominal pain. Negative for diarrhea, blood in stool and melena.  Genitourinary: Negative.   Musculoskeletal: Positive for back pain.  Skin: Negative.   Neurological: Negative.   Endo/Heme/Allergies: Negative.   Psychiatric/Behavioral: Negative.    Blood pressure 116/65, pulse 83, temperature 98.4 F (36.9 C), temperature source Oral, resp. rate 28, height '5\' 3"'  (1.6 m), weight 89.449 kg (197 lb 3.2 oz), last menstrual period 11/28/2014, SpO2 99 %. Physical Exam  Constitutional: She is oriented to person, place, and time. She appears well-developed and well-nourished. No distress.  HENT:  Head: Normocephalic and atraumatic.  Nose: Nose normal.  Eyes: Conjunctivae and EOM are normal. Right eye exhibits no discharge. Left eye exhibits no discharge. No scleral icterus.  Neck: Normal range of motion. Neck supple. No JVD present. No tracheal deviation present. No thyromegaly present.  Cardiovascular: Regular rhythm, normal heart sounds and intact distal pulses.   No murmur heard. Tachycardic   Respiratory: Effort normal and breath sounds normal. No respiratory distress. She has no wheezes. She has no rales. She exhibits no tenderness.  GI: Soft. Bowel sounds are normal. She exhibits no distension and no mass. There is tenderness (she is tender right side, RUQ much more than RLQ). There is guarding. There is no rebound.  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 She exhibits no tenderness.  Lymphadenopathy:    She has no cervical adenopathy.  Neurological: She is alert and oriented to person, place, and time. No cranial nerve deficit.  Skin: Skin is warm and dry. No rash noted. She is not diaphoretic. No erythema. No pallor.  Psychiatric: She has a normal mood and affect. Her behavior is normal. Judgment and thought content normal.    Assessment/Plan: 1.  Cholelithiasis and probable cholecystitis 2.  Sickle cell with recurrent back and chest pain 3.  Hx  of headache 4.  Body mass index is 34.94 kg/(m^2).   Plan:  I will recheck her labs in AM, make sure she doesn't have a pancreatitis, but I think she would benefit from cholecystectomy.  I told her we would start her on some antibiotics, she is already on Zosyn. Dr. Johney Maine will discuss possible surgery tomorrow.  Keep her on clears tonight, and NPO after MN. I told her many people with Sickle cell have problems with their gallbladder, and that cholecystectomy usually, but not always helps to  resolves their pain.  Carmen [name] 11/29/2014, 2:37 PM

## 2014-11-30 ENCOUNTER — Encounter (HOSPITAL_COMMUNITY)
Admission: EM | Disposition: A | Discharge status: Home / Self Care | Payer: Self-pay | Source: Home / Self Care | Attending: Internal Medicine

## 2014-11-30 ENCOUNTER — Inpatient Hospital Stay (HOSPITAL_COMMUNITY): Payer: Medicaid Other | Admitting: Anesthesiology

## 2014-11-30 ENCOUNTER — Encounter (HOSPITAL_COMMUNITY): Payer: Self-pay | Admitting: Anesthesiology

## 2014-11-30 ENCOUNTER — Inpatient Hospital Stay (HOSPITAL_COMMUNITY): Payer: Medicaid Other

## 2014-11-30 DIAGNOSIS — Z9049 Acquired absence of other specified parts of digestive tract: Secondary | ICD-10-CM | POA: Diagnosis present

## 2014-11-30 HISTORY — PX: CHOLECYSTECTOMY: SHX55

## 2014-11-30 LAB — COMPREHENSIVE METABOLIC PANEL
ALT: 31 U/L (ref 0–35)
AST: 29 U/L (ref 0–37)
Albumin: 3.5 g/dL (ref 3.5–5.2)
Alkaline Phosphatase: 58 U/L (ref 39–117)
Anion gap: 7 (ref 5–15)
BUN: <5 mg/dL — ABNORMAL LOW (ref 6–23)
CHLORIDE: 106 mmol/L (ref 96–112)
CO2: 27 mmol/L (ref 19–32)
Calcium: 8.6 mg/dL (ref 8.4–10.5)
Creatinine, Ser: 0.44 mg/dL — ABNORMAL LOW (ref 0.50–1.10)
GFR calc Af Amer: >90 mL/min (ref >90)
GFR calc non Af Amer: >90 mL/min (ref >90)
Glucose, Bld: 107 mg/dL — ABNORMAL HIGH (ref 70–99)
Potassium: 3.3 mmol/L — ABNORMAL LOW (ref 3.5–5.1)
SODIUM: 140 mmol/L (ref 135–145)
TOTAL PROTEIN: 6.7 g/dL (ref 6.0–8.3)
Total Bilirubin: 1.8 mg/dL — ABNORMAL HIGH (ref 0.3–1.2)

## 2014-11-30 LAB — HEMOGLOBIN AND HEMATOCRIT, BLOOD
HCT: 20.6 % — ABNORMAL LOW (ref 36.0–46.0)
HEMATOCRIT: 24.1 % — AB (ref 36.0–46.0)
Hemoglobin: 7.3 g/dL — ABNORMAL LOW (ref 12.0–15.0)
Hemoglobin: 8.5 g/dL — ABNORMAL LOW (ref 12.0–15.0)

## 2014-11-30 LAB — RAPID URINE DRUG SCREEN, HOSP PERFORMED
AMPHETAMINES: NOT DETECTED
BENZODIAZEPINES: POSITIVE — AB
Barbiturates: NOT DETECTED
COCAINE: NOT DETECTED
OPIATES: POSITIVE — AB
TETRAHYDROCANNABINOL: POSITIVE — AB

## 2014-11-30 LAB — CBC
HEMATOCRIT: 19.8 % — AB (ref 36.0–46.0)
Hemoglobin: 7.1 g/dL — ABNORMAL LOW (ref 12.0–15.0)
MCH: 30.1 pg (ref 26.0–34.0)
MCHC: 35.9 g/dL (ref 30.0–36.0)
MCV: 83.9 fL (ref 78.0–100.0)
PLATELETS: 480 10*3/uL — AB (ref 150–400)
RBC: 2.36 MIL/uL — ABNORMAL LOW (ref 3.87–5.11)
RDW: 20.8 % — ABNORMAL HIGH (ref 11.5–15.5)
WBC: 9.9 10*3/uL (ref 4.0–10.5)

## 2014-11-30 LAB — LIPASE, BLOOD: Lipase: 15 U/L (ref 11–59)

## 2014-11-30 LAB — PREPARE RBC (CROSSMATCH)

## 2014-11-30 LAB — MRSA PCR SCREENING: MRSA BY PCR: INVALID — AB

## 2014-11-30 SURGERY — LAPAROSCOPIC CHOLECYSTECTOMY WITH INTRAOPERATIVE CHOLANGIOGRAM
Anesthesia: General | Site: Abdomen

## 2014-11-30 MED ORDER — PROMETHAZINE HCL 25 MG/ML IJ SOLN
6.2500 mg | INTRAMUSCULAR | Status: DC | PRN
  Administered 2014-11-30: 12.5 mg via INTRAVENOUS

## 2014-11-30 MED ORDER — ROCURONIUM BROMIDE 100 MG/10ML IV SOLN
INTRAVENOUS | Status: DC | PRN
  Administered 2014-11-30 (×2): 10 mg via INTRAVENOUS
  Administered 2014-11-30: 25 mg via INTRAVENOUS
  Administered 2014-11-30: 5 mg via INTRAVENOUS

## 2014-11-30 MED ORDER — 0.9 % SODIUM CHLORIDE (POUR BTL) OPTIME
TOPICAL | Status: DC | PRN
  Administered 2014-11-30: 1000 mL

## 2014-11-30 MED ORDER — PROPOFOL 10 MG/ML IV BOLUS
INTRAVENOUS | Status: DC | PRN
  Administered 2014-11-30: 180 mg via INTRAVENOUS

## 2014-11-30 MED ORDER — HYDROMORPHONE HCL 1 MG/ML IJ SOLN
0.2500 mg | INTRAMUSCULAR | Status: DC | PRN
  Administered 2014-11-30 (×4): 0.5 mg via INTRAVENOUS

## 2014-11-30 MED ORDER — SUCCINYLCHOLINE CHLORIDE 20 MG/ML IJ SOLN
INTRAMUSCULAR | Status: DC | PRN
  Administered 2014-11-30: 100 mg via INTRAVENOUS

## 2014-11-30 MED ORDER — LACTATED RINGERS IR SOLN
Status: DC | PRN
  Administered 2014-11-30: 1

## 2014-11-30 MED ORDER — MIDAZOLAM HCL 5 MG/5ML IJ SOLN
INTRAMUSCULAR | Status: DC | PRN
  Administered 2014-11-30: 2 mg via INTRAVENOUS

## 2014-11-30 MED ORDER — HYDROMORPHONE HCL 1 MG/ML IJ SOLN
INTRAMUSCULAR | Status: AC
  Filled 2014-11-30: qty 1

## 2014-11-30 MED ORDER — MIDAZOLAM HCL 2 MG/2ML IJ SOLN
INTRAMUSCULAR | Status: AC
  Filled 2014-11-30: qty 2

## 2014-11-30 MED ORDER — ONDANSETRON HCL 4 MG/2ML IJ SOLN
INTRAMUSCULAR | Status: DC | PRN
  Administered 2014-11-30: 4 mg via INTRAVENOUS

## 2014-11-30 MED ORDER — LIDOCAINE HCL (CARDIAC) 20 MG/ML IV SOLN
INTRAVENOUS | Status: DC | PRN
  Administered 2014-11-30: 50 mg via INTRAVENOUS

## 2014-11-30 MED ORDER — CHLORHEXIDINE GLUCONATE 4 % EX LIQD
1.0000 "application " | Freq: Once | CUTANEOUS | Status: DC
  Filled 2014-11-30: qty 15

## 2014-11-30 MED ORDER — LIDOCAINE HCL (CARDIAC) 20 MG/ML IV SOLN
INTRAVENOUS | Status: AC
  Filled 2014-11-30: qty 5

## 2014-11-30 MED ORDER — BUPIVACAINE-EPINEPHRINE (PF) 0.25% -1:200000 IJ SOLN
INTRAMUSCULAR | Status: AC
Start: 2014-11-30 — End: 2014-11-30
  Filled 2014-11-30: qty 30

## 2014-11-30 MED ORDER — DEXAMETHASONE SODIUM PHOSPHATE 10 MG/ML IJ SOLN
INTRAMUSCULAR | Status: AC
  Filled 2014-11-30: qty 1

## 2014-11-30 MED ORDER — GLYCOPYRROLATE 0.2 MG/ML IJ SOLN
INTRAMUSCULAR | Status: DC | PRN
  Administered 2014-11-30: 0.6 mg via INTRAVENOUS

## 2014-11-30 MED ORDER — BUPIVACAINE-EPINEPHRINE 0.25% -1:200000 IJ SOLN
INTRAMUSCULAR | Status: AC
  Filled 2014-11-30: qty 1

## 2014-11-30 MED ORDER — BUPIVACAINE-EPINEPHRINE 0.25% -1:200000 IJ SOLN
INTRAMUSCULAR | Status: DC | PRN
  Administered 2014-11-30: 80 mL

## 2014-11-30 MED ORDER — PROPOFOL 10 MG/ML IV BOLUS
INTRAVENOUS | Status: AC
  Filled 2014-11-30: qty 20

## 2014-11-30 MED ORDER — SODIUM CHLORIDE 0.9 % IV SOLN
Freq: Once | INTRAVENOUS | Status: AC
  Administered 2014-11-30: 15:00:00 via INTRAVENOUS

## 2014-11-30 MED ORDER — ROCURONIUM BROMIDE 100 MG/10ML IV SOLN
INTRAVENOUS | Status: AC
  Filled 2014-11-30: qty 1

## 2014-11-30 MED ORDER — FENTANYL CITRATE 0.05 MG/ML IJ SOLN
INTRAMUSCULAR | Status: DC | PRN
Start: 2014-11-30 — End: 2014-11-30
  Administered 2014-11-30: 100 ug via INTRAVENOUS
  Administered 2014-11-30 (×3): 50 ug via INTRAVENOUS

## 2014-11-30 MED ORDER — CEFTRIAXONE SODIUM IN DEXTROSE 40 MG/ML IV SOLN
2.0000 g | INTRAVENOUS | Status: DC
  Administered 2014-11-30 – 2014-12-01 (×2): 2 g via INTRAVENOUS
  Filled 2014-11-30 (×2): qty 50

## 2014-11-30 MED ORDER — MEPERIDINE HCL 50 MG/ML IJ SOLN: 6.2500 mg | INTRAMUSCULAR | Status: DC | PRN

## 2014-11-30 MED ORDER — PROMETHAZINE HCL 25 MG/ML IJ SOLN
INTRAMUSCULAR | Status: AC
  Filled 2014-11-30: qty 1

## 2014-11-30 MED ORDER — STERILE WATER FOR IRRIGATION IR SOLN
Status: DC | PRN
  Administered 2014-11-30: 1500 mL

## 2014-11-30 MED ORDER — NEOSTIGMINE METHYLSULFATE 10 MG/10ML IV SOLN
INTRAVENOUS | Status: DC | PRN
  Administered 2014-11-30: 4 mg via INTRAVENOUS

## 2014-11-30 MED ORDER — FENTANYL CITRATE 0.05 MG/ML IJ SOLN
INTRAMUSCULAR | Status: AC
  Filled 2014-11-30: qty 5

## 2014-11-30 MED ORDER — DEXAMETHASONE SODIUM PHOSPHATE 10 MG/ML IJ SOLN
INTRAMUSCULAR | Status: DC | PRN
  Administered 2014-11-30: 10 mg via INTRAVENOUS

## 2014-11-30 MED ORDER — KETOROLAC TROMETHAMINE 30 MG/ML IJ SOLN
INTRAMUSCULAR | Status: DC | PRN
  Administered 2014-11-30: 30 mg via INTRAVENOUS

## 2014-11-30 MED ORDER — ONDANSETRON HCL 4 MG/2ML IJ SOLN
INTRAMUSCULAR | Status: AC
  Filled 2014-11-30: qty 2

## 2014-11-30 MED ORDER — IOHEXOL 300 MG/ML  SOLN
INTRAMUSCULAR | Status: DC | PRN
  Administered 2014-11-30: 7 mL

## 2014-11-30 MED ORDER — SODIUM CHLORIDE 0.9 % IJ SOLN
INTRAMUSCULAR | Status: AC
  Filled 2014-11-30: qty 10

## 2014-11-30 MED ORDER — GLYCOPYRROLATE 0.2 MG/ML IJ SOLN
INTRAMUSCULAR | Status: AC
  Filled 2014-11-30: qty 3

## 2014-11-30 MED ORDER — HYDROMORPHONE HCL 1 MG/ML IJ SOLN
1.0000 mg | INTRAMUSCULAR | Status: DC | PRN
  Administered 2014-11-30: 1 mg via INTRAVENOUS
  Filled 2014-11-30: qty 1

## 2014-11-30 MED ORDER — NEOSTIGMINE METHYLSULFATE 10 MG/10ML IV SOLN
INTRAVENOUS | Status: AC
  Filled 2014-11-30: qty 1

## 2014-11-30 MED ORDER — CETYLPYRIDINIUM CHLORIDE 0.05 % MT LIQD
7.0000 mL | Freq: Two times a day (BID) | OROMUCOSAL | Status: DC
  Administered 2014-11-30 – 2014-12-01 (×2): 7 mL via OROMUCOSAL

## 2014-11-30 MED ORDER — LACTATED RINGERS IV SOLN
INTRAVENOUS | Status: DC | PRN
  Administered 2014-11-30 (×2): via INTRAVENOUS

## 2014-11-30 MED ORDER — KETOROLAC TROMETHAMINE 30 MG/ML IJ SOLN
INTRAMUSCULAR | Status: AC
Start: 2014-11-30 — End: 2014-11-30
  Filled 2014-11-30: qty 1

## 2014-11-30 SURGICAL SUPPLY — 36 items
APPLIER CLIP 5 13 M/L LIGAMAX5 (MISCELLANEOUS) ×3
APPLIER CLIP ROT 10 11.4 M/L (STAPLE)
CABLE HIGH FREQUENCY MONO STRZ (ELECTRODE) ×3 IMPLANT
CLIP APPLIE 5 13 M/L LIGAMAX5 (MISCELLANEOUS) ×1 IMPLANT
CLIP APPLIE ROT 10 11.4 M/L (STAPLE) IMPLANT
COVER MAYO STAND STRL (DRAPES) ×3 IMPLANT
DECANTER SPIKE VIAL GLASS SM (MISCELLANEOUS) ×3 IMPLANT
DRAPE C-ARM 42X120 X-RAY (DRAPES) ×3 IMPLANT
DRAPE LAPAROSCOPIC ABDOMINAL (DRAPES) ×3 IMPLANT
DRAPE UTILITY XL STRL (DRAPES) ×3 IMPLANT
DRAPE WARM FLUID 44X44 (DRAPE) ×3 IMPLANT
DRSG TEGADERM 2-3/8X2-3/4 SM (GAUZE/BANDAGES/DRESSINGS) ×9 IMPLANT
DRSG TEGADERM 4X4.75 (GAUZE/BANDAGES/DRESSINGS) ×3 IMPLANT
ELECT REM PT RETURN 9FT ADLT (ELECTROSURGICAL) ×3
ELECTRODE REM PT RTRN 9FT ADLT (ELECTROSURGICAL) ×1 IMPLANT
ENDOLOOP SUT PDS II  0 18 (SUTURE) ×2
ENDOLOOP SUT PDS II 0 18 (SUTURE) ×1 IMPLANT
GAUZE SPONGE 2X2 8PLY STRL LF (GAUZE/BANDAGES/DRESSINGS) ×1 IMPLANT
GLOVE ECLIPSE 8.0 STRL XLNG CF (GLOVE) ×3 IMPLANT
GLOVE INDICATOR 8.0 STRL GRN (GLOVE) ×3 IMPLANT
GOWN STRL REUS W/TWL XL LVL3 (GOWN DISPOSABLE) ×6 IMPLANT
KIT BASIN OR (CUSTOM PROCEDURE TRAY) ×3 IMPLANT
POUCH SPECIMEN RETRIEVAL 10MM (ENDOMECHANICALS) ×3 IMPLANT
SCISSORS LAP 5X35 DISP (ENDOMECHANICALS) ×3 IMPLANT
SET CHOLANGIOGRAPH MIX (MISCELLANEOUS) ×3 IMPLANT
SET IRRIG TUBING LAPAROSCOPIC (IRRIGATION / IRRIGATOR) ×3 IMPLANT
SLEEVE XCEL OPT CAN 5 100 (ENDOMECHANICALS) ×3 IMPLANT
SPONGE GAUZE 2X2 STER 10/PKG (GAUZE/BANDAGES/DRESSINGS) ×2
SUT MNCRL AB 4-0 PS2 18 (SUTURE) ×3 IMPLANT
SUT PDS AB 1 CT1 27 (SUTURE) ×6 IMPLANT
SYR 20CC LL (SYRINGE) ×3 IMPLANT
TOWEL OR 17X26 10 PK STRL BLUE (TOWEL DISPOSABLE) ×3 IMPLANT
TOWEL OR NON WOVEN STRL DISP B (DISPOSABLE) ×3 IMPLANT
TRAY LAPAROSCOPIC (CUSTOM PROCEDURE TRAY) ×3 IMPLANT
TROCAR BLADELESS OPT 5 100 (ENDOMECHANICALS) ×3 IMPLANT
TROCAR XCEL NON-BLD 11X100MML (ENDOMECHANICALS) ×3 IMPLANT

## 2014-11-30 NOTE — Progress Notes (Addendum)
[facility]  [address] [place], [place] [address] Phone: [phone] FAX: [phone]    Carmen [name] [id number] 1991/08/23  CARE TEAM:  PCP: [name], MD  Outpatient Care Team: Patient Care Team: [name], MD as PCP - General (Internal Medicine) Sickle Cell Rounding, MD as Rounding Team (Internal Medicine)  Inpatient Treatment Team: Treatment Team: Attending Provider: Leana Gamer, MD; Rounding Team: Bonney Aid, MD; Registered Nurse: Lars Masson, RN; Registered Nurse: Leitha Bleak, RN; Consulting Physician: Nolon Nations, MD; Technician: Carolyn Stare, NT; Technician: Tennis Ship, NT; Registered Nurse: Bonnee Quin, RN   Subjective:  Sore RUQ & epigastric region Threw up jello  No personal nor family history of GI/colon cancer, inflammatory bowel disease, irritable bowel syndrome, allergy such as Celiac Sprue, dietary/dairy problems, colitis, ulcers nor gastritis.  No recent sick contacts/gastroenteritis - although "flu bug" did run through her family 3 weeks ago - not like this.  No travel outside the country.  No changes in diet.  No dysphagia to solids or liquids.  No significant heartburn or reflux - mild at most & not c/w current Sx.  No hematochezia, hematemesis, coffee ground emesis.  No evidence of prior gastric/peptic ulceration.    Objective:  Vital signs:  Filed Vitals:   11/30/14 0014 11/30/14 0020 11/30/14 0209 11/30/14 0527  BP:   115/80 122/76  Pulse:   83 79  Temp:   98.5 F (36.9 C) 98.4 F (36.9 C)  TempSrc:   Oral Oral  Resp: '22 20 20 20  ' Height:      Weight:      SpO2: 98% 98% 98% 98%       Intake/Output   Yesterday:  01/29 0701 - 01/30 0700 In: 540 [P.O.:540] Out: 2500 [Urine:2500] This shift:     Bowel function:  Flatus: y  BM: n  Drain: n/a  Physical Exam:  General: Pt awake/alert/oriented x4 in mild acute  distress Eyes: PERRL, normal EOM.  Sclera clear.  No icterus Neuro: CN II-XII intact w/o focal sensory/motor deficits. Lymph: No head/neck/groin lymphadenopathy Psych:  No delerium/psychosis/paranoia HENT: Normocephalic, Mucus membranes moist.  No thrush Neck: Supple, No tracheal deviation Chest: No chest wall pain w good excursion CV:  Pulses intact.  Regular rhythm MS: Normal AROM mjr joints.  No obvious deformity Abdomen: Soft.  Nondistended.  RUQ> epigastric TTP with Murphys sign.  Nontender abdomen elsewhere..  No evidence of peritonitis.  No incarcerated hernias. Ext:  SCDs BLE.  No mjr edema.  No cyanosis Skin: No petechiae / purpura   Problem List:   Active Problems:   Sickle cell pain crisis   Leukocytosis   Thrombocytosis   Hypokalemia   Atypical chest pain   Symptomatic cholelithiasis   Assessment  Carmen [name]  24 y.o. female  Day of Surgery  Procedure(s): LAPAROSCOPIC CHOLECYSTECTOMY WITH INTRAOPERATIVE CHOLANGIOGRAM  Persistent pain c/w acute cholecystitis  Plan:  Lap chole:  The anatomy & physiology of hepatobiliary & pancreatic function was discussed.  The pathophysiology of gallbladder dysfunction was discussed.  Differential diagnosis discussed & seems to be ruling out.  Natural history risks without surgery was discussed.   I feel the risks of no intervention will lead to serious problems that outweigh the operative risks; therefore, I recommended cholecystectomy to remove the pathology.  I explained laparoscopic techniques with possible need for an open approach.  Probable cholangiogram to evaluate the bilary tract was explained as well.  Risks such as bleeding, infection, abscess, leak, injury to other organs, need for further treatment, stroke, heart attack, death, and other risks were discussed.  I noted a good likelihood this will help address the problem.  I did caution it may not resolve all abd pain issues in patients with SCD.  Possibility that  this will not correct all abdominal symptoms was explained.  Goals of post-operative recovery were discussed as well.  We will work to minimize complications.  An educational handout further explaining the pathology and treatment options was given as well.  Questions were answered.  The patient expresses understanding & wishes to proceed with surgery.    -VTE prophylaxis- SCDs, etc -mobilize as tolerated to help recovery  Adin Hector, M.D., F.A.C.S. Gastrointestinal and Minimally Invasive Surgery [facility] [address], [place], [place] [address] [phone] Main / Paging   11/30/2014   Results:   Labs: Results for orders placed or performed during the hospital encounter of 11/28/14 (from the past 48 hour(s))  CBC with Differential     Status: Abnormal   Collection Time: 11/28/14  4:17 PM  Result Value Ref Range   WBC 12.1 (H) 4.0 - 10.5 K/uL    Comment: ADJUSTED FOR NUCLEATED RBC'S   RBC 2.67 (L) 3.87 - 5.11 MIL/uL   Hemoglobin 7.9 (L) 12.0 - 15.0 g/dL   HCT 22.6 (L) 36.0 - 46.0 %   MCV 84.6 78.0 - 100.0 fL   MCH 29.6 26.0 - 34.0 pg   MCHC 35.0 30.0 - 36.0 g/dL   RDW 19.9 (H) 11.5 - 15.5 %   Platelets 623 (H) 150 - 400 K/uL   Neutrophils Relative % 61 43 - 77 %   Lymphocytes Relative 28 12 - 46 %   Monocytes Relative 10 3 - 12 %   Eosinophils Relative 1 0 - 5 %   Basophils Relative 0 0 - 1 %   Neutro Abs 7.4 1.7 - 7.7 K/uL   Lymphs Abs 3.4 0.7 - 4.0 K/uL   Monocytes Absolute 1.2 (H) 0.1 - 1.0 K/uL   Eosinophils Absolute 0.1 0.0 - 0.7 K/uL   Basophils Absolute 0.0 0.0 - 0.1 K/uL   RBC Morphology POLYCHROMASIA PRESENT     Comment: HOWELL/JOLLY BODIES SICKLE CELLS TARGET CELLS   Comprehensive metabolic panel     Status: Abnormal   Collection Time: 11/28/14  4:17 PM  Result Value Ref Range   Sodium 139 135 - 145 mmol/L   Potassium 2.8 (L) 3.5 - 5.1 mmol/L   Chloride 109 96 - 112 mmol/L   CO2 24 19 - 32 mmol/L   Glucose,  Bld 93 70 - 99 mg/dL   BUN 6 6 - 23 mg/dL   Creatinine, Ser 0.50 0.50 - 1.10 mg/dL   Calcium 9.0 8.4 - 10.5 mg/dL   Total Protein 7.5 6.0 - 8.3 g/dL   Albumin 3.9 3.5 - 5.2 g/dL   AST 33 0 - 37 U/L   ALT 53 (H) 0 - 35 U/L   Alkaline Phosphatase 67 39 - 117 U/L   Total Bilirubin 1.8 (H) 0.3 - 1.2 mg/dL   GFR calc non Af Amer >90 >90 mL/min   GFR calc Af Amer >90 >90 mL/min    Comment: (NOTE) The eGFR has been calculated using the CKD EPI equation. This calculation has not been validated in all clinical situations. eGFR's persistently <90 mL/min signify possible Chronic Kidney Disease.    Anion gap  6 5 - 15  Reticulocytes     Status: Abnormal   Collection Time: 11/28/14  4:17 PM  Result Value Ref Range   Retic Ct Pct 14.4 (H) 0.4 - 3.1 %   RBC. 2.67 (L) 3.87 - 5.11 MIL/uL   Retic Count, Manual 384.5 (H) 19.0 - 186.0 K/uL  I-stat troponin, ED     Status: None   Collection Time: 11/28/14  4:23 PM  Result Value Ref Range   Troponin i, poc 0.00 0.00 - 0.08 ng/mL   Comment 3            Comment: Due to the release kinetics of cTnI, a negative result within the first hours of the onset of symptoms does not rule out myocardial infarction with certainty. If myocardial infarction is still suspected, repeat the test at appropriate intervals.   Urinalysis, Routine w reflex microscopic     Status: Abnormal   Collection Time: 11/28/14  4:58 PM  Result Value Ref Range   Color, Urine YELLOW YELLOW   APPearance CLOUDY (A) CLEAR   Specific Gravity, Urine 1.012 1.005 - 1.030   pH 6.5 5.0 - 8.0   Glucose, UA NEGATIVE NEGATIVE mg/dL   Hgb urine dipstick MODERATE (A) NEGATIVE   Bilirubin Urine NEGATIVE NEGATIVE   Ketones, ur NEGATIVE NEGATIVE mg/dL   Protein, ur NEGATIVE NEGATIVE mg/dL   Urobilinogen, UA 1.0 0.0 - 1.0 mg/dL   Nitrite NEGATIVE NEGATIVE   Leukocytes, UA TRACE (A) NEGATIVE  Urine microscopic-add on     Status: Abnormal   Collection Time: 11/28/14  4:58 PM  Result Value Ref  Range   Squamous Epithelial / LPF MANY (A) RARE   WBC, UA 7-10 <3 WBC/hpf   RBC / HPF 3-6 <3 RBC/hpf   Bacteria, UA MANY (A) RARE   Urine-Other MUCOUS PRESENT   POC Urine Pregnancy, ED (do NOT order at [facility])     Status: None   Collection Time: 11/28/14  5:04 PM  Result Value Ref Range   Preg Test, Ur NEGATIVE NEGATIVE    Comment:        THE SENSITIVITY OF THIS METHODOLOGY IS >24 mIU/mL   CBC WITH DIFFERENTIAL     Status: Abnormal   Collection Time: 11/28/14  9:12 PM  Result Value Ref Range   WBC 12.9 (H) 4.0 - 10.5 K/uL    Comment: WHITE COUNT CONFIRMED ON SMEAR ADJUSTED FOR NUCLEATED RBC'S    RBC 2.34 (L) 3.87 - 5.11 MIL/uL   Hemoglobin 7.0 (L) 12.0 - 15.0 g/dL   HCT 19.9 (L) 36.0 - 46.0 %   MCV 85.0 78.0 - 100.0 fL   MCH 29.9 26.0 - 34.0 pg   MCHC 35.2 30.0 - 36.0 g/dL   RDW 21.0 (H) 11.5 - 15.5 %   Platelets 515 (H) 150 - 400 K/uL   Neutrophils Relative % 62 43 - 77 %   Lymphocytes Relative 28 12 - 46 %   Monocytes Relative 9 3 - 12 %   Eosinophils Relative 1 0 - 5 %   Basophils Relative 0 0 - 1 %   Neutro Abs 8.0 (H) 1.7 - 7.7 K/uL   Lymphs Abs 3.6 0.7 - 4.0 K/uL   Monocytes Absolute 1.2 (H) 0.1 - 1.0 K/uL   Eosinophils Absolute 0.1 0.0 - 0.7 K/uL   Basophils Absolute 0.0 0.0 - 0.1 K/uL   RBC Morphology SICKLE CELLS     Comment: TARGET CELLS MARKED POLYCHROMASIA   Troponin I (q 6hr  x 3)     Status: None   Collection Time: 11/28/14  9:12 PM  Result Value Ref Range   Troponin I <0.03 <0.031 ng/mL    Comment:        NO INDICATION OF MYOCARDIAL INJURY.   Comprehensive metabolic panel     Status: Abnormal   Collection Time: 11/29/14  4:40 AM  Result Value Ref Range   Sodium 142 135 - 145 mmol/L   Potassium 3.6 3.5 - 5.1 mmol/L    Comment: DELTA CHECK NOTED REPEATED TO VERIFY    Chloride 111 96 - 112 mmol/L   CO2 24 19 - 32 mmol/L   Glucose, Bld 92 70 - 99 mg/dL   BUN 6 6 - 23 mg/dL   Creatinine, Ser 0.49 (L) 0.50 - 1.10 mg/dL   Calcium 8.5 8.4 - 10.5  mg/dL   Total Protein 6.5 6.0 - 8.3 g/dL   Albumin 3.5 3.5 - 5.2 g/dL   AST 25 0 - 37 U/L   ALT 41 (H) 0 - 35 U/L   Alkaline Phosphatase 60 39 - 117 U/L   Total Bilirubin 1.7 (H) 0.3 - 1.2 mg/dL   GFR calc non Af Amer >90 >90 mL/min   GFR calc Af Amer >90 >90 mL/min    Comment: (NOTE) The eGFR has been calculated using the CKD EPI equation. This calculation has not been validated in all clinical situations. eGFR's persistently <90 mL/min signify possible Chronic Kidney Disease.    Anion gap 7 5 - 15  Lactate dehydrogenase     Status: None   Collection Time: 11/29/14  4:40 AM  Result Value Ref Range   LDH 225 94 - 250 U/L  Magnesium     Status: None   Collection Time: 11/29/14  4:40 AM  Result Value Ref Range   Magnesium 1.8 1.5 - 2.5 mg/dL  Reticulocytes     Status: Abnormal   Collection Time: 11/29/14  4:40 AM  Result Value Ref Range   Retic Ct Pct 15.8 (H) 0.4 - 3.1 %   RBC. 2.29 (L) 3.87 - 5.11 MIL/uL   Retic Count, Manual 361.8 (H) 19.0 - 186.0 K/uL  Troponin I (q 6hr x 3)     Status: None   Collection Time: 11/29/14  4:40 AM  Result Value Ref Range   Troponin I <0.03 <0.031 ng/mL    Comment:        NO INDICATION OF MYOCARDIAL INJURY.   Troponin I (q 6hr x 3)     Status: None   Collection Time: 11/29/14 11:13 AM  Result Value Ref Range   Troponin I <0.03 <0.031 ng/mL    Comment:        NO INDICATION OF MYOCARDIAL INJURY.   Type and screen for Sickle Cell Protocol     Status: None   Collection Time: 11/29/14  8:30 PM  Result Value Ref Range   ABO/RH(D) O POS    Antibody Screen NEG    Sample Expiration 12/02/2014   APTT     Status: None   Collection Time: 11/29/14  8:30 PM  Result Value Ref Range   aPTT 37 24 - 37 seconds    Comment:        IF BASELINE aPTT IS ELEVATED, SUGGEST PATIENT RISK ASSESSMENT BE USED TO DETERMINE APPROPRIATE ANTICOAGULANT THERAPY.   Protime-INR     Status: Abnormal   Collection Time: 11/29/14  8:30 PM  Result Value Ref Range    Prothrombin Time  15.4 (H) 11.6 - 15.2 seconds   INR 1.21 0.00 - 1.49    Imaging / Studies: Dg Chest 2 View  11/28/2014   CLINICAL DATA:  Initial encounter for sickle cell pain crisis. Shortness of breath and cough.  EXAM: CHEST  2 VIEW  COMPARISON:  11/24/2014  FINDINGS: Lumbar spine endplate irregularity, consistent with the clinical history of sickle cell.  Midline trachea. Normal heart size and mediastinal contours. No pleural effusion or pneumothorax. mild lower lobe predominant interstitial thickening is chronic and nonspecific. Minimal volume loss at the right lung base. No lobar consolidation.  IMPRESSION: No acute cardiopulmonary disease.   Electronically Signed   By: Abigail Miyamoto M.D.   On: 11/28/2014 16:17   US Abdomen Limited Ruq  11/29/2014   CLINICAL DATA:  Abdominal pain and elevated LFTs.  EXAM: US ABDOMEN LIMITED - RIGHT UPPER QUADRANT  COMPARISON:  None.  FINDINGS: Gallbladder:  Gallbladder wall is markedly thickened measuring 13.1 mm. There are multiple stones within the gallbladder which measure up to 1 cm. (Note: Patient on morphine pump and technologist could not reliably assess for sonographic Murphy's sign. Never the less, sonographer reports tenderness in the right upper quadrant).  Common bile duct:  Diameter: 3.1 mm.  Liver:  No focal liver abnormality. Mild intrahepatic biliary prominence. No focal liver abnormality.  Other:  Small right effusion noted.  IMPRESSION: 1. Gallstones and gallbladder wall thickening. Findings are concerning for cholecystitis.   Electronically Signed   By: Kerby Moors M.D.   On: 11/29/2014 09:43    Medications / Allergies: per chart  Antibiotics: Anti-infectives    Start     Dose/Rate Route Frequency Ordered Stop   11/30/14 0600  cefTRIAXone (ROCEPHIN) 2 g in dextrose 5 % 50 mL IVPB - Premix    Comments:  Pharmacy may adjust dosing strength / duration / interval for maximal efficacy   2 g100 mL/hr over 30 Minutes Intravenous Every 24  hours 11/30/14 0229     11/29/14 1600  piperacillin-tazobactam (ZOSYN) IVPB 3.375 g  Status:  Discontinued     3.375 g12.5 mL/hr over 240 Minutes Intravenous Every 8 hours 11/29/14 1402 11/30/14 0229   11/29/14 1415  piperacillin-tazobactam (ZOSYN) IVPB 3.375 g  Status:  Discontinued     3.375 g12.5 mL/hr over 240 Minutes Intravenous 4 times per day 11/29/14 1400 11/29/14 1401       Note: Portions of this report may have been transcribed using voice recognition software. Every effort was made to ensure accuracy; however, inadvertent computerized transcription errors may be present.   Any transcriptional errors that result from this process are unintentional.

## 2014-11-30 NOTE — Op Note (Signed)
11/30/2014  11:17 AM  PATIENT:  Carmen [name]  24 y.o. female  Patient Care Team: [name], MD as PCP - General (Internal Medicine) Sickle Cell Rounding, MD as Rounding Team (Internal Medicine)  PRE-OPERATIVE DIAGNOSIS:  cholecystitis  POST-OPERATIVE DIAGNOSIS:  Acute cholecystitis  PROCEDURE:  Procedure(s): LAPAROSCOPIC CHOLECYSTECTOMY SINGLE SITE WITH INTRAOPERATIVE CHOLANGIOGRAM  SURGEON:  Surgeon(s): Michael Boston, MD  ASSISTANT: RN   ANESTHESIA:   local and general  EBL:  Total I/O In: 1000 [I.V.:1000] Out: 300 [Urine:300]  Delay start of Pharmacological VTE agent (>24hrs) due to surgical blood loss or risk of bleeding:  no  DRAINS: none   SPECIMEN:  Source of Specimen:  Gallbladder   DISPOSITION OF SPECIMEN:  PATHOLOGY  COUNTS:  YES  PLAN OF CARE: Admit to inpatient   PATIENT DISPOSITION:  PACU - hemodynamically stable.  INDICATION: Obese but pleasant young woman with sickle cell disease and chronic pain.  Developed more sharp upper abdominal pain focused in right upper quadrant.  Positive Murphy sign.  Ultrasound concerning for gallbladder work thickening and cholecystitis.  Recommended considering cholecystectomy:  The anatomy & physiology of hepatobiliary & pancreatic function was discussed.  The pathophysiology of gallbladder dysfunction was discussed.  Natural history risks without surgery was discussed.   I feel the risks of no intervention will lead to serious problems that outweigh the operative risks; therefore, I recommended cholecystectomy to remove the pathology.  I explained laparoscopic techniques with possible need for an open approach.  Probable cholangiogram to evaluate the bilary tract was explained as well.    Risks such as bleeding, infection, abscess, leak, injury to other organs, need for further treatment, heart attack, death, and other risks were discussed.  I noted a good likelihood this will help address the problem.  Possibility  that this will not correct all abdominal symptoms was explained.  Goals of post-operative recovery were discussed as well.  We will work to minimize complications.  An educational handout further explaining the pathology and treatment options was given as well.  Questions were answered.  The patient expresses understanding & wishes to proceed with surgery.   OR FINDINGS: Moderate gallbladder wall thickening and distention consistent with acute cholecystitis.  Classic biliary anatomy.  No evidence of biliary obstruction.  DESCRIPTION:   The patient was identified & brought in the operating room. The patient was positioned supine with arms tucked. SCDs were active during the entire case. The patient underwent general anesthesia without any difficulty.  The abdomen was prepped and draped in a sterile fashion. A Surgical Timeout confirmed our plan.  I made a transverse curvilinear incision through the superior umbilical fold.  I placed a 70mm long port through the supraumbilical fascia using a modified Hassan cutdown technique. I began carbon dioxide insufflation. Camera inspection revealed no injury. There were no adhesions to the anterior abdominal wall supraumbilically.  I proceeded to continue with single site technique. I placed a #5 port in left upper aspect of the wound. I placed a 5 mm atraumatic grasper in the right inferior aspect of the wound.  I turned attention to the right upper quadrant.  The gallbladder was rather distended and could not be easily grasped.  Therefore needle aspiration of thickened bile from the dome of the gallbladder was done to better collapse It.  The gallbladder fundus was elevated cephalad.  the patient had moderate adhesions of greater omentum and duodenum.  These were carefully freed off using focused ultrasonic and blunt dissection.  I freed  the peritoneal coverings between the gallbladder and the liver on the posteriolateral and anteriomedial walls. I alternated  between Harmonic & blunt Maryland dissection to help get a good critical view of the cystic artery and cystic duct.   she had an enlarged liver that was soft and floppy..  Intrahepatic gallbladder.  I did further dissection to free  the proximal 2/3 of the gallbladder off the liver bed to get a good critical view of the infundibulum and cystic duct. I mobilized the cystic artery; and, after getting a good 360 view, ligated the enlarged cystic artery using the Harmonic ultrasonic dissection.  I put 2 clips on the end of the cystic ductarterial stump I skeletonized the cystic duct.  I placed a clip on the infundibulum. I did a partial cystic duct-otomy and ensured patency. I placed a 5 [place] cholangiocatheter through a puncture site at the right subcostal ridge of the abdominal wall and directed it into the cystic duct.  We ran a cholangiogram with dilute radio-opaque contrast and continuous fluoroscopy.  Contrast flowed from a side branch consistent with cystic duct cannulization. Contrast flowed up the common hepatic duct into the right and left intrahepatic chains out to secondary radicals. Contrast flowed down the common bile duct easily across the normal ampulla into the duodenum.  This was consistent with a normal cholangiogram.  I removed the cholangiocatheter. I placed clips on the cystic duct x4.  Because it was rather inflamed and somewhat thickened, I did ligate the cystic duct further with a 0 PDS Endoloop on the cystic duct stump to good result.   I completed cystic duct transection. I freed the gallbladder from its remaining attachments to the liver. I ensured hemostasis on the gallbladder fossa of the liver and elsewhere. I inspected the rest of the abdomen & detected no injury nor bleeding elsewhere.  I removed the gallbladder out the supraumbilical fascia.  I had the open the fascia to 2-1/2 cm to get the thickened gallbladder and large stones out.  I closed the fascia transversely using #1  PDS interrupted stitches. I closed the skin using 4-0 monocryl stitch.  Sterile dressing was applied. The patient was extubated & arrived in the PACU in stable condition..  I had discussed postoperative care with the patient in the holding area.  I am about to locate the patient's family and discuss operative findings and postoperative goals / instructions.  Instructions are written in the chart as well.  Adin Hector, M.D., F.A.C.S. Gastrointestinal and Minimally Invasive Surgery [facility] [address], [place], [place] [address] [phone] Main / Paging

## 2014-11-30 NOTE — Anesthesia Postprocedure Evaluation (Signed)
  Anesthesia Post-op Note  Patient: Carmen [name]  Procedure(s) Performed: Procedure(s): LAPAROSCOPIC CHOLECYSTECTOMY SINGLE SITE WITH INTRAOPERATIVE CHOLANGIOGRAM (N/A)  Patient Location: PACU  Anesthesia Type:General  Level of Consciousness: awake, alert  and oriented  Airway and Oxygen Therapy: Patient Spontanous Breathing  Post-op Pain: mild  Post-op Assessment: Post-op Vital signs reviewed, Patient's Cardiovascular Status Stable, Respiratory Function Stable, Patent Airway, No signs of Nausea or vomiting and Pain level controlled  Post-op Vital Signs: Reviewed and stable  Last Vitals:  Filed Vitals:   11/30/14 1215  BP: 129/79  Pulse: 76  Temp: 36.7 C  Resp: 25    Complications: No apparent anesthesia complications

## 2014-11-30 NOTE — Progress Notes (Addendum)
SICKLE CELL SERVICE PROGRESS NOTE  Carmen [name] ZTI:[id number] DOB: July 17, 1991 DOA: 11/28/2014 PCP: [name], MD   Presenting HPI: 24 year old female with a history of sickle cell disease, depression presents with her typical sickle cell pain manifested by chest discomfort and upper back pain. The patient was recently discharged from the hospital on 11/24/2014 after an admission for sickle cell crisis. She stated that she was not discharged with any opioids at that time. She actually came back to the emergency department at [facility] on 11/25/2014. She was discharged from the emergency department with Percocet. She states that she was given 10 Percocet and has taken all 10 without relief of her chest discomfort or back pain. The patient denies any recent injuries. She also complains of cough with yellow sputum. She denies any hemoptysis. She had some subjective fevers and chills at home but has been afebrile since admission. She complains of some nausea, but have one episode of emesis in the emergency department. She denies any dysuria, hematuria, diarrhea, hematochezia, melena, vaginal discharge. In the emergency department, the patient was afebrile and hemodynamically stable. Oxygen saturation was 99% on room air. Labs showed WBC 12.1, hemoglobin 7.9, reticulocyte count 14.4%. Potassium was 2.8. Hepatic enzymes were mildly elevated with AST 33, ALT 53, alk phosphatase 67, total bilirubin 1.8. Chest x-ray was unremarkable. EKG showed sinus rhythm without concerning ST-T wave changes. The patient received 4 mg Dilaudid in the emergency department without much relief although the patient was resting comfortably when I arrived. Urine pregnancy test was negative  Consultants:  Gen Surg  Procedures:  none  Antibiotics:  Zosyn 1/29-->1/30  Ceftriaxone 1/30-->  HPI/Subjective: Pt went to OR this morning for cholecystectomy. She is feeling okay currently. She was given Fentanyl throughout  the procedure and given IV Dilaudid afterwards.  Objective: Filed Vitals:   11/30/14 1130 11/30/14 1135 11/30/14 1140 11/30/14 1145  BP: 132/81   131/75  Pulse: 92 93 83 74  Temp:      TempSrc:      Resp: '24 20 19 21  ' Height:      Weight:      SpO2: 97% 96% 100% 100%   Weight change:   Intake/Output Summary (Last 24 hours) at 11/30/14 1150 Last data filed at 11/30/14 1124  Gross per 24 hour  Intake   1590 ml  Output   1800 ml  Net   -210 ml    General: Alert, awake, oriented x3, lethargic. HEENT: [place]/AT PEERL, EOMI OROPHARYNX:  Moist, No exudate/ erythema/lesions.  Heart: Regular rate and rhythm, without murmurs, rubs, gallops. Lungs: Clear to auscultation, no wheezing or rhonchi noted.  Abdomen: Soft, mild diffuse tenderness. Umbilical dressing is c/d/i Musculoskeletal: No warm swelling or erythema around joints Extremities: no c/c/e   Data Reviewed: Basic Metabolic Panel:  Recent Labs Lab 11/24/14 0425 11/24/14 1732 11/28/14 1617 11/29/14 0440 11/30/14 0746  NA 136 134* 139 142 140  K 3.4* 3.2* 2.8* 3.6 3.3*  CL 105 102 109 111 106  CO2 '25 21 24 24 27  ' GLUCOSE 111* 86 93 92 107*  BUN 5* <5* 6 6 <5*  CREATININE 0.44* 0.55 0.50 0.49* 0.44*  CALCIUM 8.9 9.4 9.0 8.5 8.6  MG  --   --   --  1.8  --    Liver Function Tests:  Recent Labs Lab 11/24/14 0425 11/28/14 1617 11/29/14 0440 11/30/14 0746  AST 121* 33 25 29  ALT 114* 53* 41* 31  ALKPHOS 59 67 60  57  BILITOT 3.1* 1.8* 1.7* 1.8*  PROT 7.2 7.5 6.5 6.7  ALBUMIN 3.6 3.9 3.5 3.5    Recent Labs Lab 11/30/14 0746  LIPASE 15   No results for input(s): AMMONIA in the last 168 hours. CBC:  Recent Labs Lab 11/24/14 0425 11/24/14 1732 11/28/14 1617 11/28/14 2112 11/30/14 0746  WBC 8.9 11.9* 12.1* 12.9* 9.9  NEUTROABS 4.3 6.2 7.4 8.0*  --   HGB 7.7* 8.7* 7.9* 7.0* 7.1*  HCT 21.2* 23.7* 22.6* 19.9* 19.8*  MCV 80.3 77.7* 84.6 85.0 83.9  PLT 486* 560* 623* 515* 480*   Cardiac  Enzymes:  Recent Labs Lab 11/28/14 2112 11/29/14 0440 11/29/14 1113  TROPONINI <0.03 <0.03 <0.03   BNP (last 3 results) No results for input(s): PROBNP in the last 8760 hours. CBG: No results for input(s): GLUCAP in the last 168 hours.  Recent Results (from the past 240 hour(s))  MRSA PCR Screening     Status: Abnormal   Collection Time: 11/30/14  7:22 AM  Result Value Ref Range Status   MRSA by PCR INVALID RESULTS, SPECIMEN SENT FOR CULTURE (A) NEGATIVE Final    Comment:        The GeneXpert MRSA Assay (FDA approved for NASAL specimens only), is one component of a comprehensive MRSA colonization surveillance program. It is not intended to diagnose MRSA infection nor to guide or monitor treatment for MRSA infections. RESULT CALLED TO, READ BACK BY AND VERIFIED WITH: Annamary Carolin [id number] @ [id number] BY J SCOTTON      Studies: Dg Chest 2 View  11/28/2014   CLINICAL DATA:  Initial encounter for sickle cell pain crisis. Shortness of breath and cough.  EXAM: CHEST  2 VIEW  COMPARISON:  11/24/2014  FINDINGS: Lumbar spine endplate irregularity, consistent with the clinical history of sickle cell.  Midline trachea. Normal heart size and mediastinal contours. No pleural effusion or pneumothorax. mild lower lobe predominant interstitial thickening is chronic and nonspecific. Minimal volume loss at the right lung base. No lobar consolidation.  IMPRESSION: No acute cardiopulmonary disease.   Electronically Signed   By: Abigail Miyamoto M.D.   On: 11/28/2014 16:17   Dg Chest 2 View  11/24/2014   CLINICAL DATA:  Sickle cell crisis for 2 days.  EXAM: CHEST  2 VIEW  COMPARISON:  04/03/2014  FINDINGS: Normal heart, mediastinum hila. Clear lungs. No pleural effusion or pneumothorax. Bony thorax is unremarkable.  IMPRESSION: Normal chest radiographs.   Electronically Signed   By: Lajean Manes M.D.   On: 11/24/2014 17:46   Dg Cholangiogram Operative  11/30/2014   CLINICAL DATA:  Intraoperative  cholangiogram  EXAM: INTRAOPERATIVE CHOLANGIOGRAM  TECHNIQUE: Cholangiographic images from the C-arm fluoroscopic device were submitted for interpretation post-operatively. Please see the procedural report for the amount of contrast and the fluoroscopy time utilized.  COMPARISON:  None.  FINDINGS: Two submitted images show minimal contrast in the right upper quadrant. Ducts not well defined on the 2 submitted images.  IMPRESSION: Limited imaging from intraoperative cholangiogram. Please refer to the procedure report for complete description.   Electronically Signed   By: Lajean Manes M.D.   On: 11/30/2014 11:05   US Abdomen Limited Ruq  11/29/2014   CLINICAL DATA:  Abdominal pain and elevated LFTs.  EXAM: US ABDOMEN LIMITED - RIGHT UPPER QUADRANT  COMPARISON:  None.  FINDINGS: Gallbladder:  Gallbladder wall is markedly thickened measuring 13.1 mm. There are multiple stones within the gallbladder which measure up to 1 cm. (Note: Patient  on morphine pump and technologist could not reliably assess for sonographic Murphy's sign. Never the less, sonographer reports tenderness in the right upper quadrant).  Common bile duct:  Diameter: 3.1 mm.  Liver:  No focal liver abnormality. Mild intrahepatic biliary prominence. No focal liver abnormality.  Other:  Small right effusion noted.  IMPRESSION: 1. Gallstones and gallbladder wall thickening. Findings are concerning for cholecystitis.   Electronically Signed   By: Kerby Moors M.D.   On: 11/29/2014 09:43    Scheduled Meds: . [MAR Hold] cefTRIAXone (ROCEPHIN)  IV  2 g Intravenous Q24H  . [START ON 12/01/2014] chlorhexidine  1 application Topical Once  . [MAR Hold] enoxaparin (LOVENOX) injection  40 mg Subcutaneous Q24H  . [MAR Hold] folic acid  1 mg Oral Daily  . HYDROmorphone      . [MAR Hold] HYDROmorphone PCA 0.3 mg/mL   Intravenous 6 times per day  . [MAR Hold] hydroxyurea  500 mg Oral BID  . [MAR Hold] ketorolac  30 mg Intravenous 4 times per day  . [MAR  Hold] senna-docusate  1 tablet Oral BID   Continuous Infusions: . dextrose 5 % and 0.45% NaCl 100 mL/hr at 11/30/14 0932    Principal Problem:   Symptomatic cholelithiasis Active Problems:   Sickle cell pain crisis   Leukocytosis   Thrombocytosis   Hypokalemia   Atypical chest pain   Assessment/Plan: Principal Problem:   Symptomatic cholelithiasis Active Problems:   Sickle cell pain crisis   Leukocytosis   Thrombocytosis   Hypokalemia   Atypical chest pain  1)Sickle Cell Crisis: Pt presented with pain consistent with VOC. Continue Dilaudid PCA.  Will Continue Toradol.  2)Cholecystitis, POD 0 of cholecystectomy: Pt's symptoms and U/S were consistent with cholecystitis. General surgery performed a cholecystectomy this morning, appreciate recs. She was started on Zosyn empirically then switched to ceftriaxone perioperatively, day 2 of antibiotics. 3) Anemia: Hgb stable at 7.1, this morning. Due to recent surgery will recheck H/H and transfuse 1 unit PRBC to decrease post-op risk. Goal Hgb ~9. 4)Sickle Cell Care: Continue Folic acid and hydroxyurea.   5) FEN/GI : Will start clear fluids  IV fluids per protocol-D5/0.45% Increase to 174m/hr Bowel regimen in place   Code Status: Full  DVT Prophylaxis: enoxaparin  Family Communication: none  Disposition Plan: none  Time spent:25 minutes  AKalman Shan Pager [phone] If 7PM-7AM, please contact night-coverage.  11/30/2014, 11:50 AM  LOS: 2 days   AKalman Shan

## 2014-11-30 NOTE — Progress Notes (Signed)
Pt. Returned from OR. Alert and oriented x 4. No respiratory distress noted. Abdominal dressing clean, dry and intact.

## 2014-11-30 NOTE — Anesthesia Preprocedure Evaluation (Addendum)
Anesthesia Evaluation  Patient identified by MRN, date of birth, ID band Patient awake    Reviewed: Allergy & Precautions, NPO status , Patient's Chart, lab work & pertinent test results  Airway Mallampati: III  TM Distance: >3 FB Neck ROM: Full    Dental no notable dental hx. (+) Teeth Intact, Poor Dentition   Pulmonary neg pulmonary ROS,  breath sounds clear to auscultation  Pulmonary exam normal       Cardiovascular negative cardio ROS  Rhythm:Regular Rate:Normal     Neuro/Psych  Headaches, PSYCHIATRIC DISORDERS Depression    GI/Hepatic Neg liver ROS, Cholecystitis   Endo/Other  Obesity  Renal/GU negative Renal ROS  negative genitourinary   Musculoskeletal negative musculoskeletal ROS (+)   Abdominal (+) + obese,  Abdomen: tender.    Peds  Hematology  (+) Sickle cell anemia and anemia ,   Anesthesia Other Findings   Reproductive/Obstetrics negative OB ROS                            Anesthesia Physical Anesthesia Plan  ASA: III  Anesthesia Plan: General   Post-op Pain Management:    Induction: Intravenous and Cricoid pressure planned  Airway Management Planned: Oral ETT  Additional Equipment:   Intra-op Plan:   Post-operative Plan: Extubation in OR  Informed Consent: I have reviewed the patients History and Physical, chart, labs and discussed the procedure including the risks, benefits and alternatives for the proposed anesthesia with the patient or authorized representative who has indicated his/her understanding and acceptance.   Dental advisory given  Plan Discussed with: CRNA, Anesthesiologist and Surgeon  Anesthesia Plan Comments:         Anesthesia Quick Evaluation

## 2014-11-30 NOTE — Transfer of Care (Signed)
Immediate Anesthesia Transfer of Care Note  Patient: Carmen [name]  Procedure(s) Performed: Procedure(s): LAPAROSCOPIC CHOLECYSTECTOMY SINGLE SITE WITH INTRAOPERATIVE CHOLANGIOGRAM (N/A)  Patient Location: PACU  Anesthesia Type:General  Level of Consciousness: awake, alert  and oriented  Airway & Oxygen Therapy: Patient Spontanous Breathing and Patient connected to face mask oxygen  Post-op Assessment: Report given to RN and Post -op Vital signs reviewed and stable  Post vital signs: Reviewed and stable  Last Vitals:  Filed Vitals:   11/30/14 0849  BP: 139/90  Pulse: 102  Temp: 36.5 C  Resp: 24    Complications: No apparent anesthesia complications

## 2014-12-01 LAB — RETICULOCYTES
RBC.: 2.65 MIL/uL — AB (ref 3.87–5.11)
Retic Count, Absolute: 352.5 10*3/uL — ABNORMAL HIGH (ref 19.0–186.0)
Retic Ct Pct: 13.3 % — ABNORMAL HIGH (ref 0.4–3.1)

## 2014-12-01 LAB — BASIC METABOLIC PANEL
ANION GAP: 5 (ref 5–15)
BUN: 5 mg/dL — AB (ref 6–23)
CO2: 27 mmol/L (ref 19–32)
CREATININE: 0.44 mg/dL — AB (ref 0.50–1.10)
Calcium: 8.8 mg/dL (ref 8.4–10.5)
Chloride: 102 mmol/L (ref 96–112)
GFR calc Af Amer: >90 mL/min (ref >90)
GLUCOSE: 119 mg/dL — AB (ref 70–99)
POTASSIUM: 3.7 mmol/L (ref 3.5–5.1)
Sodium: 134 mmol/L — ABNORMAL LOW (ref 135–145)

## 2014-12-01 LAB — HEPATIC FUNCTION PANEL
ALT: 63 U/L — AB (ref 0–35)
AST: 64 U/L — AB (ref 0–37)
Albumin: 3.5 g/dL (ref 3.5–5.2)
Alkaline Phosphatase: 65 U/L (ref 39–117)
BILIRUBIN DIRECT: 0.4 mg/dL (ref 0.0–0.5)
BILIRUBIN INDIRECT: 1 mg/dL — AB (ref 0.3–0.9)
Total Bilirubin: 1.4 mg/dL — ABNORMAL HIGH (ref 0.3–1.2)
Total Protein: 6.9 g/dL (ref 6.0–8.3)

## 2014-12-01 LAB — COMPREHENSIVE METABOLIC PANEL
ALBUMIN: 3.4 g/dL — AB (ref 3.5–5.2)
ALK PHOS: 63 U/L (ref 39–117)
ALT: 58 U/L — ABNORMAL HIGH (ref 0–35)
AST: 49 U/L — AB (ref 0–37)
Anion gap: 7 (ref 5–15)
BILIRUBIN TOTAL: 1.6 mg/dL — AB (ref 0.3–1.2)
BUN: <5 mg/dL — ABNORMAL LOW (ref 6–23)
CO2: 26 mmol/L (ref 19–32)
Calcium: 8.7 mg/dL (ref 8.4–10.5)
Chloride: 103 mmol/L (ref 96–112)
Creatinine, Ser: 0.5 mg/dL (ref 0.50–1.10)
GFR calc Af Amer: >90 mL/min (ref >90)
GFR calc non Af Amer: >90 mL/min (ref >90)
GLUCOSE: 121 mg/dL — AB (ref 70–99)
POTASSIUM: 3 mmol/L — AB (ref 3.5–5.1)
Sodium: 136 mmol/L (ref 135–145)
TOTAL PROTEIN: 6.7 g/dL (ref 6.0–8.3)

## 2014-12-01 LAB — CBC WITH DIFFERENTIAL/PLATELET
BASOS ABS: 0 10*3/uL (ref 0.0–0.1)
BASOS PCT: 0 % (ref 0–1)
EOS PCT: 0 % (ref 0–5)
Eosinophils Absolute: 0 10*3/uL (ref 0.0–0.7)
HCT: 22.1 % — ABNORMAL LOW (ref 36.0–46.0)
Hemoglobin: 7.9 g/dL — ABNORMAL LOW (ref 12.0–15.0)
Lymphocytes Relative: 21 % (ref 12–46)
Lymphs Abs: 2.4 10*3/uL (ref 0.7–4.0)
MCH: 29.8 pg (ref 26.0–34.0)
MCHC: 35.7 g/dL (ref 30.0–36.0)
MCV: 83.4 fL (ref 78.0–100.0)
MONO ABS: 1.2 10*3/uL — AB (ref 0.1–1.0)
MONOS PCT: 11 % (ref 3–12)
Neutro Abs: 7.6 10*3/uL (ref 1.7–7.7)
Neutrophils Relative %: 68 % (ref 43–77)
Platelets: 423 10*3/uL — ABNORMAL HIGH (ref 150–400)
RBC: 2.65 MIL/uL — ABNORMAL LOW (ref 3.87–5.11)
RDW: 19.3 % — AB (ref 11.5–15.5)
WBC: 11.2 10*3/uL — ABNORMAL HIGH (ref 4.0–10.5)

## 2014-12-01 LAB — LIPASE, BLOOD: LIPASE: 15 U/L (ref 11–59)

## 2014-12-01 LAB — LACTATE DEHYDROGENASE: LDH: 329 U/L — AB (ref 94–250)

## 2014-12-01 MED ORDER — HYDROXYZINE HCL 10 MG PO TABS
10.0000 mg | ORAL_TABLET | Freq: Once | ORAL | Status: AC
  Administered 2014-12-01: 10 mg via ORAL
  Filled 2014-12-01: qty 1

## 2014-12-01 MED ORDER — PHENOL 1.4 % MT LIQD
2.0000 | OROMUCOSAL | Status: DC | PRN
  Filled 2014-12-01: qty 177

## 2014-12-01 MED ORDER — ALUM & MAG HYDROXIDE-SIMETH 200-200-20 MG/5ML PO SUSP
30.0000 mL | Freq: Four times a day (QID) | ORAL | Status: DC | PRN
  Administered 2014-12-01: 30 mL via ORAL
  Filled 2014-12-01: qty 30

## 2014-12-01 MED ORDER — BISACODYL 10 MG RE SUPP: 10.0000 mg | Freq: Two times a day (BID) | RECTAL | Status: DC | PRN

## 2014-12-01 MED ORDER — HYDROMORPHONE HCL 1 MG/ML IJ SOLN
1.0000 mg | INTRAMUSCULAR | Status: DC | PRN
  Administered 2014-12-01 – 2014-12-02 (×3): 1 mg via INTRAVENOUS
  Filled 2014-12-01 (×3): qty 1

## 2014-12-01 MED ORDER — LACTATED RINGERS IV BOLUS (SEPSIS): 1000.0000 mL | Freq: Three times a day (TID) | INTRAVENOUS | Status: DC | PRN

## 2014-12-01 MED ORDER — LIP MEDEX EX OINT
1.0000 "application " | TOPICAL_OINTMENT | Freq: Two times a day (BID) | CUTANEOUS | Status: DC
  Administered 2014-12-01 (×2): 1 via TOPICAL
  Filled 2014-12-01: qty 7

## 2014-12-01 MED ORDER — MENTHOL 3 MG MT LOZG
1.0000 | LOZENGE | OROMUCOSAL | Status: DC | PRN
  Filled 2014-12-01: qty 9

## 2014-12-01 MED ORDER — SACCHAROMYCES BOULARDII 250 MG PO CAPS
250.0000 mg | ORAL_CAPSULE | Freq: Two times a day (BID) | ORAL | Status: DC
  Administered 2014-12-01 – 2014-12-02 (×3): 250 mg via ORAL
  Filled 2014-12-01 (×5): qty 1

## 2014-12-01 MED ORDER — MAGIC MOUTHWASH
15.0000 mL | Freq: Four times a day (QID) | ORAL | Status: DC
  Administered 2014-12-01: 15 mL via ORAL
  Filled 2014-12-01 (×9): qty 15

## 2014-12-01 MED ORDER — HYDROCODONE-ACETAMINOPHEN 10-325 MG PO TABS
1.0000 | ORAL_TABLET | ORAL | Status: DC
Start: 2014-12-01 — End: 2014-12-02
  Administered 2014-12-01 – 2014-12-02 (×5): 1 via ORAL
  Filled 2014-12-01 (×5): qty 1

## 2014-12-01 MED ORDER — IBUPROFEN 800 MG PO TABS
800.0000 mg | ORAL_TABLET | Freq: Four times a day (QID) | ORAL | Status: DC
  Administered 2014-12-01 – 2014-12-02 (×4): 800 mg via ORAL
  Filled 2014-12-01 (×8): qty 1

## 2014-12-01 NOTE — Progress Notes (Signed)
SICKLE CELL SERVICE PROGRESS NOTE  Carmen [name] ASN:[id number] DOB: [birth year] DOA: 11/28/2014 PCP: [name], MD   Presenting HPI: 23 year old female with a history of sickle cell disease, depression presents with her typical sickle cell pain manifested by chest discomfort and upper back pain. The patient was recently discharged from the hospital on 11/24/2014 after an admission for sickle cell crisis. She stated that she was not discharged with any opioids at that time. She actually came back to the emergency department at [facility] on 11/25/2014. She was discharged from the emergency department with Percocet. She states that she was given 10 Percocet and has taken all 10 without relief of her chest discomfort or back pain. The patient denies any recent injuries. She also complains of cough with yellow sputum. She denies any hemoptysis. She had some subjective fevers and chills at home but has been afebrile since admission. She complains of some nausea, but have one episode of emesis in the emergency department. She denies any dysuria, hematuria, diarrhea, hematochezia, melena, vaginal discharge. In the emergency department, the patient was afebrile and hemodynamically stable. Oxygen saturation was 99% on room air. Labs showed WBC 12.1, hemoglobin 7.9, reticulocyte count 14.4%. Potassium was 2.8. Hepatic enzymes were mildly elevated with AST 33, ALT 53, alk phosphatase 67, total bilirubin 1.8. Chest x-ray was unremarkable. EKG showed sinus rhythm without concerning ST-T wave changes. The patient received 4 mg Dilaudid in the emergency department without much relief although the patient was resting comfortably when I arrived. Urine pregnancy test was negative  Consultants:  Gen Surg  Procedures:  none  Antibiotics:  Zosyn 1/29-->1/30  Ceftriaxone 1/30-->1/31  HPI/Subjective: Pt states that she feels much better today. She rates her pain at a 7/10. She tolerated full liquid diet  well. She has walked around several times. She complains of some sore throat.  Objective: Filed Vitals:   12/01/14 0344 12/01/14 0504 12/01/14 0825 12/01/14 1159  BP:  135/98    Pulse:  55    Temp:  98.4 F (36.9 C)    TempSrc:  Oral    Resp: '17 15 17 24  ' Height:      Weight:      SpO2: 97% 98% 93% 100%   Weight change:   Intake/Output Summary (Last 24 hours) at 12/01/14 1243 Last data filed at 12/01/14 0506  Gross per 24 hour  Intake 3739.17 ml  Output   2300 ml  Net 1439.17 ml    General: Alert, awake, oriented x3, NAD HEENT: [place]/AT PEERL, EOMI OROPHARYNX:  Moist, normal oropharynx, no exudate/ erythema/lesions.  Heart: Regular rate and rhythm, without murmurs, rubs, gallops. Lungs: Clear to auscultation, no wheezing or rhonchi noted.  Abdomen: Soft, mild surgical site tenderness.Marland Kitchen Umbilical dressing is c/d/i Musculoskeletal: No warm swelling or erythema around joints Extremities: no c/c/e   Data Reviewed: Basic Metabolic Panel:  Recent Labs Lab 11/24/14 1732 11/28/14 1617 11/29/14 0440 11/30/14 0746 12/01/14 0430  NA 134* 139 142 140 134*  K 3.2* 2.8* 3.6 3.3* 3.7  CL 102 109 111 106 102  CO2 '21 24 24 27 27  ' GLUCOSE 86 93 92 107* 119*  BUN <5* 6 6 <5* 5*  CREATININE 0.55 0.50 0.49* 0.44* 0.44*  CALCIUM 9.4 9.0 8.5 8.6 8.8  MG  --   --  1.8  --   --    Liver Function Tests:  Recent Labs Lab 11/28/14 1617 11/29/14 0440 11/30/14 0746 12/01/14 0430  AST 33 25 29 64*  ALT 53* 41* 31 63*  ALKPHOS 67 60 58 65  BILITOT 1.8* 1.7* 1.8* 1.4*  PROT 7.5 6.5 6.7 6.9  ALBUMIN 3.9 3.5 3.5 3.5    Recent Labs Lab 11/30/14 0746 12/01/14 0430  LIPASE 15 15   No results for input(s): AMMONIA in the last 168 hours. CBC:  Recent Labs Lab 11/24/14 1732 11/28/14 1617 11/28/14 2112 11/30/14 0746 11/30/14 1352 11/30/14 1823 12/01/14 0430  WBC 11.9* 12.1* 12.9* 9.9  --   --  11.2*  NEUTROABS 6.2 7.4 8.0*  --   --   --  7.6  HGB 8.7* 7.9* 7.0* 7.1* 7.3*  8.5* 7.9*  HCT 23.7* 22.6* 19.9* 19.8* 20.6* 24.1* 22.1*  MCV 77.7* 84.6 85.0 83.9  --   --  83.4  PLT 560* 623* 515* 480*  --   --  423*   Cardiac Enzymes:  Recent Labs Lab 11/28/14 2112 11/29/14 0440 11/29/14 1113  TROPONINI <0.03 <0.03 <0.03   BNP (last 3 results) No results for input(s): PROBNP in the last 8760 hours. CBG: No results for input(s): GLUCAP in the last 168 hours.  Recent Results (from the past 240 hour(s))  MRSA PCR Screening     Status: Abnormal   Collection Time: 11/30/14  7:22 AM  Result Value Ref Range Status   MRSA by PCR INVALID RESULTS, SPECIMEN SENT FOR CULTURE (A) NEGATIVE Final    Comment:        The GeneXpert MRSA Assay (FDA approved for NASAL specimens only), is one component of a comprehensive MRSA colonization surveillance program. It is not intended to diagnose MRSA infection nor to guide or monitor treatment for MRSA infections. RESULT CALLED TO, READ BACK BY AND VERIFIED WITH: Annamary Carolin [id number] @ 2330 BY J SCOTTON      Studies: Dg Chest 2 View  11/28/2014   CLINICAL DATA:  Initial encounter for sickle cell pain crisis. Shortness of breath and cough.  EXAM: CHEST  2 VIEW  COMPARISON:  11/24/2014  FINDINGS: Lumbar spine endplate irregularity, consistent with the clinical history of sickle cell.  Midline trachea. Normal heart size and mediastinal contours. No pleural effusion or pneumothorax. mild lower lobe predominant interstitial thickening is chronic and nonspecific. Minimal volume loss at the right lung base. No lobar consolidation.  IMPRESSION: No acute cardiopulmonary disease.   Electronically Signed   By: Abigail Miyamoto M.D.   On: 11/28/2014 16:17   Dg Chest 2 View  11/24/2014   CLINICAL DATA:  Sickle cell crisis for 2 days.  EXAM: CHEST  2 VIEW  COMPARISON:  04/03/2014  FINDINGS: Normal heart, mediastinum hila. Clear lungs. No pleural effusion or pneumothorax. Bony thorax is unremarkable.  IMPRESSION: Normal chest radiographs.    Electronically Signed   By: Lajean Manes M.D.   On: 11/24/2014 17:46   Dg Cholangiogram Operative  11/30/2014   CLINICAL DATA:  Intraoperative cholangiogram  EXAM: INTRAOPERATIVE CHOLANGIOGRAM  TECHNIQUE: Cholangiographic images from the C-arm fluoroscopic device were submitted for interpretation post-operatively. Please see the procedural report for the amount of contrast and the fluoroscopy time utilized.  COMPARISON:  None.  FINDINGS: Two submitted images show minimal contrast in the right upper quadrant. Ducts not well defined on the 2 submitted images.  IMPRESSION: Limited imaging from intraoperative cholangiogram. Please refer to the procedure report for complete description.   Electronically Signed   By: Lajean Manes M.D.   On: 11/30/2014 11:05   US Abdomen Limited Ruq  11/29/2014   CLINICAL DATA:  Abdominal pain and elevated LFTs.  EXAM: US ABDOMEN LIMITED - RIGHT UPPER QUADRANT  COMPARISON:  None.  FINDINGS: Gallbladder:  Gallbladder wall is markedly thickened measuring 13.1 mm. There are multiple stones within the gallbladder which measure up to 1 cm. (Note: Patient on morphine pump and technologist could not reliably assess for sonographic Murphy's sign. Never the less, sonographer reports tenderness in the right upper quadrant).  Common bile duct:  Diameter: 3.1 mm.  Liver:  No focal liver abnormality. Mild intrahepatic biliary prominence. No focal liver abnormality.  Other:  Small right effusion noted.  IMPRESSION: 1. Gallstones and gallbladder wall thickening. Findings are concerning for cholecystitis.   Electronically Signed   By: Kerby Moors M.D.   On: 11/29/2014 09:43    Scheduled Meds: . antiseptic oral rinse  7 mL Mouth Rinse BID  . enoxaparin (LOVENOX) injection  40 mg Subcutaneous Q24H  . folic acid  1 mg Oral Daily  . HYDROcodone-acetaminophen  1 tablet Oral Q4H  . hydroxyurea  500 mg Oral BID  . ibuprofen  800 mg Oral QID  . lip balm  1 application Topical BID  . magic  mouthwash  15 mL Oral QID  . saccharomyces boulardii  250 mg Oral BID  . 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 Will stop Dilaudid PCA.  Start Norco 325/34m q4h. Will Continue Toradol. Add Dilaudid 137mIV q3h PRN for pain. 2)Cholecystitis, POD 1 of cholecystectomy: Pt's symptoms and U/S were consistent with cholecystitis. General surgery performed a cholecystectomy on 11/30/14, appreciate recs. She was started on Zosyn empirically then switched to ceftriaxone perioperatively. Perioperative antibiotic prophylaxis is complete. 3) Anemia: Hgb stable at 7.9, this morning. Given 1 unit PRBC on 11/30/14 to decrease post-op risk.  4)Sickle Cell Care: Continue Folic acid and hydroxyurea. 5)Sore throat: Cepacol lozenges   5) FEN/GI : Tolerated full liquids well, will start Soft diet.   IV fluids per protocol-KVO Bowel regimen in place   Code Status: Full  DVT Prophylaxis: enoxaparin  Family Communication: none  Disposition Plan: Likely tomorrow if pain is tolerable on PO meds, and she remains stable. Will need follow-up with general surgery.  Time spent:25 minutes  AGKalman ShanPager [phone] 7PM-7AM, please contact night-coverage.  12/01/2014, 12:43 PM  LOS: 3 days   AGKalman Shan

## 2014-12-01 NOTE — Progress Notes (Addendum)
[facility]  [address] [place], [place] [address] Phone: [phone] FAX: [phone]    ALEEYAH BENSEN [id number] Mar 31, 1991  CARE TEAM:  PCP: [name], MD  Outpatient Care Team: Patient Care Team: [name], MD as PCP - General (Internal Medicine) Sickle Cell Rounding, MD as Rounding Team (Internal Medicine)  Inpatient Treatment Team: Treatment Team: Attending Provider: Kalman Shan, MD; Rounding Team: Sickle Carilyn Goodpasture, MD; Registered Nurse: Lars Masson, RN; Registered Nurse: Leitha Bleak, RN; Consulting Physician: Nolon Nations, MD; Technician: Tennis Ship, NT; Technician: Vonna Drafts, [place]; Registered Nurse: Cheryle Horsfall, RN; Registered Nurse: Norva Pavlov, RN   Subjective:  Sore but better Walked x2 in hallways Sore throat Appreciative of RN & MD care  Objective:  Vital signs:  Filed Vitals:   12/01/14 0214 12/01/14 0344 12/01/14 0504 12/01/14 0825  BP: 145/93  135/98   Pulse: 63  55   Temp: 98.2 F (36.8 C)  98.4 F (36.9 C)   TempSrc: Oral  Oral   Resp: '23 17 15 17  ' Height:      Weight:      SpO2: 100% 97% 98% 93%    Last BM Date: 11/28/14  Intake/Output   Yesterday:  01/30 0701 - 01/31 0700 In: 5189.2 [P.O.:1290; I.V.:3564.2; Blood:335] Out: 2600 [Urine:2600] This shift:     Bowel function:  Flatus: y  BM: n  Drain: n/a  Physical Exam:  General: Pt awake/alert/oriented x4 in mild acute distress - better Eyes: PERRL, normal EOM.  Sclera clear.  No icterus Neuro: CN II-XII intact w/o focal sensory/motor deficits. Lymph: No head/neck/groin lymphadenopathy Psych:  No delerium/psychosis/paranoia HENT: Normocephalic, Mucus membranes moist.  No thrush Neck: Supple, No tracheal deviation Chest: No chest wall pain w good excursion CV:  Pulses intact.  Regular rhythm MS: Normal AROM mjr joints.  No obvious deformity Abdomen: Soft.   Nondistended.  Mildly tender at incisions only.  No evidence of peritonitis.  No incarcerated hernias. Ext:  SCDs BLE.  No mjr edema.  No cyanosis Skin: No petechiae / purpura   Problem List:   Principal Problem:   Symptomatic cholelithiasis Active Problems:   Sickle cell pain crisis   Leukocytosis   Thrombocytosis   Hypokalemia   Atypical chest pain   Assessment  [name]  24 y.o. female  1 Day Post-Op  Procedure(s): LAPAROSCOPIC CHOLECYSTECTOMY SINGLE SITE WITH INTRAOPERATIVE CHOLANGIOGRAM  POST-OPERATIVE DIAGNOSIS: Acute cholecystitis  PROCEDURE: Procedure(s): LAPAROSCOPIC CHOLECYSTECTOMY SINGLE SITE WITH INTRAOPERATIVE CHOLANGIOGRAM  SURGEON: Surgeon(s): Michael Boston, MD  ASSISTANT: RN   Improving  Plan:  -adv diet gradually -palliate sore throat & follow.  Doubt thrush -?wean IVF down - defer to primary service.  Can IV medlock from surgery standpoint -bowel regimen -wean off PCA to PO meds gradually - prob better chance when tol PO solids.  Pt noted ibuprofen helps - will switch to that -VTE prophylaxis- SCDs, etc -mobilize as tolerated to help recovery  I updated the patient's status to the patient.  Recommendations were made.  Questions were answered.  The patient expressed understanding & appreciation.   Adin Hector, M.D., F.A.C.S. Gastrointestinal and Minimally Invasive Surgery [facility] [address], [place], [place] [address] [phone] Main / Paging   12/01/2014   Results:   Labs: Results for orders placed or performed during the hospital encounter of 11/28/14 (from the past 48 hour(s))  Troponin I (q 6hr x 3)  Status: None   Collection Time: 11/29/14 11:13 AM  Result Value Ref Range   Troponin I <0.03 <0.031 ng/mL    Comment:        NO INDICATION OF MYOCARDIAL INJURY.   Type and screen for Sickle Cell Protocol     Status: None (Preliminary result)   Collection Time: 11/29/14   8:30 PM  Result Value Ref Range   ABO/RH(D) O POS    Antibody Screen NEG    Sample Expiration 12/02/2014    Unit Number [id number]    Blood Component Type RED CELLS,LR    Unit division 00    Status of Unit ISSUED    Transfusion Status OK TO TRANSFUSE    Crossmatch Result Compatible    Unit Number [id number]    Blood Component Type RED CELLS,LR    Unit division 00    Status of Unit ALLOCATED    Transfusion Status OK TO TRANSFUSE    Crossmatch Result Compatible   APTT     Status: None   Collection Time: 11/29/14  8:30 PM  Result Value Ref Range   aPTT 37 24 - 37 seconds    Comment:        IF BASELINE aPTT IS ELEVATED, SUGGEST PATIENT RISK ASSESSMENT BE USED TO DETERMINE APPROPRIATE ANTICOAGULANT THERAPY.   Protime-INR     Status: Abnormal   Collection Time: 11/29/14  8:30 PM  Result Value Ref Range   Prothrombin Time 15.4 (H) 11.6 - 15.2 seconds   INR 1.21 0.00 - 1.49  MRSA PCR Screening     Status: Abnormal   Collection Time: 11/30/14  7:22 AM  Result Value Ref Range   MRSA by PCR INVALID RESULTS, SPECIMEN SENT FOR CULTURE (A) NEGATIVE    Comment:        The GeneXpert MRSA Assay (FDA approved for NASAL specimens only), is one component of a comprehensive MRSA colonization surveillance program. It is not intended to diagnose MRSA infection nor to guide or monitor treatment for MRSA infections. RESULT CALLED TO, READ BACK BY AND VERIFIED WITH: Annamary Carolin [id number] @ 1023 BY J SCOTTON   CBC     Status: Abnormal   Collection Time: 11/30/14  7:46 AM  Result Value Ref Range   WBC 9.9 4.0 - 10.5 K/uL    Comment: ADJUSTED FOR NUCLEATED RBC'S   RBC 2.36 (L) 3.87 - 5.11 MIL/uL   Hemoglobin 7.1 (L) 12.0 - 15.0 g/dL   HCT 19.8 (L) 36.0 - 46.0 %   MCV 83.9 78.0 - 100.0 fL   MCH 30.1 26.0 - 34.0 pg   MCHC 35.9 30.0 - 36.0 g/dL   RDW 20.8 (H) 11.5 - 15.5 %   Platelets 480 (H) 150 - 400 K/uL    Comment: REPEATED TO VERIFY LARGE PLATELETS PRESENT PLATELET  COUNT CONFIRMED BY SMEAR   Comprehensive metabolic panel     Status: Abnormal   Collection Time: 11/30/14  7:46 AM  Result Value Ref Range   Sodium 140 135 - 145 mmol/L   Potassium 3.3 (L) 3.5 - 5.1 mmol/L   Chloride 106 96 - 112 mmol/L   CO2 27 19 - 32 mmol/L   Glucose, Bld 107 (H) 70 - 99 mg/dL   BUN <5 (L) 6 - 23 mg/dL   Creatinine, Ser 0.44 (L) 0.50 - 1.10 mg/dL   Calcium 8.6 8.4 - 10.5 mg/dL   Total Protein 6.7 6.0 - 8.3 g/dL   Albumin 3.5 3.5 - 5.2 g/dL  AST 29 0 - 37 U/L   ALT 31 0 - 35 U/L   Alkaline Phosphatase 58 39 - 117 U/L   Total Bilirubin 1.8 (H) 0.3 - 1.2 mg/dL   GFR calc non Af Amer >90 >90 mL/min   GFR calc Af Amer >90 >90 mL/min    Comment: (NOTE) The eGFR has been calculated using the CKD EPI equation. This calculation has not been validated in all clinical situations. eGFR's persistently <90 mL/min signify possible Chronic Kidney Disease.    Anion gap 7 5 - 15  Lipase, blood     Status: None   Collection Time: 11/30/14  7:46 AM  Result Value Ref Range   Lipase 15 11 - 59 U/L  Hemoglobin and hematocrit, blood     Status: Abnormal   Collection Time: 11/30/14  1:52 PM  Result Value Ref Range   Hemoglobin 7.3 (L) 12.0 - 15.0 g/dL   HCT 20.6 (L) 36.0 - 46.0 %  Prepare RBC     Status: None   Collection Time: 11/30/14  2:00 PM  Result Value Ref Range   Order Confirmation ORDER PROCESSED BY BLOOD BANK   Hemoglobin and hematocrit, blood     Status: Abnormal   Collection Time: 11/30/14  6:23 PM  Result Value Ref Range   Hemoglobin 8.5 (L) 12.0 - 15.0 g/dL   HCT 24.1 (L) 36.0 - 46.0 %  Urine rapid drug screen (hosp performed)     Status: Abnormal   Collection Time: 11/30/14  6:45 PM  Result Value Ref Range   Opiates POSITIVE (A) NONE DETECTED   Cocaine NONE DETECTED NONE DETECTED   Benzodiazepines POSITIVE (A) NONE DETECTED   Amphetamines NONE DETECTED NONE DETECTED   Tetrahydrocannabinol POSITIVE (A) NONE DETECTED   Barbiturates NONE DETECTED NONE  DETECTED    Comment:        DRUG SCREEN FOR MEDICAL PURPOSES ONLY.  IF CONFIRMATION IS NEEDED FOR ANY PURPOSE, NOTIFY LAB WITHIN 5 DAYS.        LOWEST DETECTABLE LIMITS FOR URINE DRUG SCREEN Drug Class       Cutoff (ng/mL) Amphetamine      1000 Barbiturate      200 Benzodiazepine   308 Tricyclics       657 Opiates          300 Cocaine          300 THC              50   Basic metabolic panel     Status: Abnormal   Collection Time: 12/01/14  4:30 AM  Result Value Ref Range   Sodium 134 (L) 135 - 145 mmol/L   Potassium 3.7 3.5 - 5.1 mmol/L   Chloride 102 96 - 112 mmol/L   CO2 27 19 - 32 mmol/L   Glucose, Bld 119 (H) 70 - 99 mg/dL   BUN 5 (L) 6 - 23 mg/dL   Creatinine, Ser 0.44 (L) 0.50 - 1.10 mg/dL   Calcium 8.8 8.4 - 10.5 mg/dL   GFR calc non Af Amer >90 >90 mL/min   GFR calc Af Amer >90 >90 mL/min    Comment: (NOTE) The eGFR has been calculated using the CKD EPI equation. This calculation has not been validated in all clinical situations. eGFR's persistently <90 mL/min signify possible Chronic Kidney Disease.    Anion gap 5 5 - 15  Reticulocytes     Status: Abnormal   Collection Time: 12/01/14  4:30 AM  Result Value Ref Range   Retic Ct Pct 13.3 (H) 0.4 - 3.1 %   RBC. 2.65 (L) 3.87 - 5.11 MIL/uL   Retic Count, Manual 352.5 (H) 19.0 - 186.0 K/uL  CBC with Differential/Platelet     Status: Abnormal   Collection Time: 12/01/14  4:30 AM  Result Value Ref Range   WBC 11.2 (H) 4.0 - 10.5 K/uL    Comment: WHITE COUNT CONFIRMED ON SMEAR ADJUSTED FOR NUCLEATED RBC'S    RBC 2.65 (L) 3.87 - 5.11 MIL/uL   Hemoglobin 7.9 (L) 12.0 - 15.0 g/dL   HCT 22.1 (L) 36.0 - 46.0 %   MCV 83.4 78.0 - 100.0 fL   MCH 29.8 26.0 - 34.0 pg   MCHC 35.7 30.0 - 36.0 g/dL   RDW 19.3 (H) 11.5 - 15.5 %   Platelets 423 (H) 150 - 400 K/uL   Neutrophils Relative % 68 43 - 77 %   Lymphocytes Relative 21 12 - 46 %   Monocytes Relative 11 3 - 12 %   Eosinophils Relative 0 0 - 5 %   Basophils  Relative 0 0 - 1 %   Neutro Abs 7.6 1.7 - 7.7 K/uL   Lymphs Abs 2.4 0.7 - 4.0 K/uL   Monocytes Absolute 1.2 (H) 0.1 - 1.0 K/uL   Eosinophils Absolute 0.0 0.0 - 0.7 K/uL   Basophils Absolute 0.0 0.0 - 0.1 K/uL   RBC Morphology TARGET CELLS     Comment: POLYCHROMASIA PRESENT SICKLE CELLS RARE NRBCs    WBC Morphology VACUOLATED NEUTROPHILS   Lactate dehydrogenase     Status: Abnormal   Collection Time: 12/01/14  4:30 AM  Result Value Ref Range   LDH 329 (H) 94 - 250 U/L    Imaging / Studies: Dg Cholangiogram Operative  11/30/2014   CLINICAL DATA:  Intraoperative cholangiogram  EXAM: INTRAOPERATIVE CHOLANGIOGRAM  TECHNIQUE: Cholangiographic images from the C-arm fluoroscopic device were submitted for interpretation post-operatively. Please see the procedural report for the amount of contrast and the fluoroscopy time utilized.  COMPARISON:  None.  FINDINGS: Two submitted images show minimal contrast in the right upper quadrant. Ducts not well defined on the 2 submitted images.  IMPRESSION: Limited imaging from intraoperative cholangiogram. Please refer to the procedure report for complete description.   Electronically Signed   By: Lajean Manes M.D.   On: 11/30/2014 11:05   US Abdomen Limited Ruq  11/29/2014   CLINICAL DATA:  Abdominal pain and elevated LFTs.  EXAM: US ABDOMEN LIMITED - RIGHT UPPER QUADRANT  COMPARISON:  None.  FINDINGS: Gallbladder:  Gallbladder wall is markedly thickened measuring 13.1 mm. There are multiple stones within the gallbladder which measure up to 1 cm. (Note: Patient on morphine pump and technologist could not reliably assess for sonographic Murphy's sign. Never the less, sonographer reports tenderness in the right upper quadrant).  Common bile duct:  Diameter: 3.1 mm.  Liver:  No focal liver abnormality. Mild intrahepatic biliary prominence. No focal liver abnormality.  Other:  Small right effusion noted.  IMPRESSION: 1. Gallstones and gallbladder wall thickening.  Findings are concerning for cholecystitis.   Electronically Signed   By: Kerby Moors M.D.   On: 11/29/2014 09:43    Medications / Allergies: per chart  Antibiotics: Anti-infectives    Start     Dose/Rate Route Frequency Ordered Stop   11/30/14 0600  cefTRIAXone (ROCEPHIN) 2 g in dextrose 5 % 50 mL IVPB - Premix    Comments:  Pharmacy may adjust  dosing strength / duration / interval for maximal efficacy   2 g100 mL/hr over 30 Minutes Intravenous Every 24 hours 11/30/14 0229     11/29/14 1600  piperacillin-tazobactam (ZOSYN) IVPB 3.375 g  Status:  Discontinued     3.375 g12.5 mL/hr over 240 Minutes Intravenous Every 8 hours 11/29/14 1402 11/30/14 0229   11/29/14 1415  piperacillin-tazobactam (ZOSYN) IVPB 3.375 g  Status:  Discontinued     3.375 g12.5 mL/hr over 240 Minutes Intravenous 4 times per day 11/29/14 1400 11/29/14 1401       Note: Portions of this report may have been transcribed using voice recognition software. Every effort was made to ensure accuracy; however, inadvertent computerized transcription errors may be present.   Any transcriptional errors that result from this process are unintentional.

## 2014-12-02 ENCOUNTER — Encounter (HOSPITAL_COMMUNITY): Payer: Self-pay | Admitting: Surgery

## 2014-12-02 LAB — CBC WITH DIFFERENTIAL/PLATELET
BASOS ABS: 0 10*3/uL (ref 0.0–0.1)
BASOS PCT: 0 % (ref 0–1)
BLASTS: 0 %
Band Neutrophils: 0 % (ref 0–10)
Eosinophils Absolute: 0.3 10*3/uL (ref 0.0–0.7)
Eosinophils Relative: 2 % (ref 0–5)
HCT: 21.9 % — ABNORMAL LOW (ref 36.0–46.0)
Hemoglobin: 7.6 g/dL — ABNORMAL LOW (ref 12.0–15.0)
LYMPHS ABS: 7.3 10*3/uL — AB (ref 0.7–4.0)
Lymphocytes Relative: 47 % — ABNORMAL HIGH (ref 12–46)
MCH: 29.7 pg (ref 26.0–34.0)
MCHC: 34.7 g/dL (ref 30.0–36.0)
MCV: 85.5 fL (ref 78.0–100.0)
METAMYELOCYTES PCT: 1 %
Monocytes Absolute: 0.6 10*3/uL (ref 0.1–1.0)
Monocytes Relative: 4 % (ref 3–12)
Myelocytes: 1 %
NEUTROS ABS: 7.4 10*3/uL (ref 1.7–7.7)
Neutrophils Relative %: 45 % (ref 43–77)
PROMYELOCYTES ABS: 0 %
Platelets: 453 10*3/uL — ABNORMAL HIGH (ref 150–400)
RBC: 2.56 MIL/uL — ABNORMAL LOW (ref 3.87–5.11)
RDW: 21.2 % — AB (ref 11.5–15.5)
WBC: 15.6 10*3/uL — ABNORMAL HIGH (ref 4.0–10.5)
nRBC: 19 /100 WBC — ABNORMAL HIGH

## 2014-12-02 LAB — MRSA CULTURE

## 2014-12-02 NOTE — Progress Notes (Signed)
Pt sleeping, awoken for vital signs at 0550. O2 sat reading from 70-83% on RA. RR 14. Obtained second dinamap and received similar readings. Got pt to sit up and use IS (up to 1250cc). O2 sat now 95%. All other VS WNL and pt without complaints. Continue to monitor. Hortencia Conradi RN.

## 2014-12-02 NOTE — Discharge Instructions (Signed)
LAPAROSCOPIC SURGERY: POST OP INSTRUCTIONS  1. DIET: Follow a light bland diet the first 24 hours after arrival home, such as soup, liquids, crackers, etc.  Be sure to include lots of fluids daily.  Avoid fast food or heavy meals as your are more likely to get nauseated.  Eat a low fat the next few days after surgery.   2. Take your usually prescribed home medications unless otherwise directed. 3. PAIN CONTROL: a. Pain is best controlled by a usual combination of three different methods TOGETHER: i. Ice/Heat ii. Over the counter pain medication iii. Prescription pain medication b. Most patients will experience some swelling and bruising around the incisions.  Ice packs or heating pads (30-60 minutes up to 6 times a day) will help. Use ice for the first few days to help decrease swelling and bruising, then switch to heat to help relax tight/sore spots and speed recovery.  Some people prefer to use ice alone, heat alone, alternating between ice & heat.  Experiment to what works for you.  Swelling and bruising can take several weeks to resolve.   c. It is helpful to take an over-the-counter pain medication regularly for the first few weeks.  Choose one of the following that works best for you: i. Naproxen (Aleve, etc)  Two 220mg  tabs twice a day ii. Ibuprofen (Advil, etc) Three 200mg  tabs four times a day (every meal & bedtime) iii. Acetaminophen (Tylenol, etc) 500-650mg  four times a day (every meal & bedtime) d. A  prescription for pain medication (such as oxycodone, hydrocodone, etc) should be given to you upon discharge.  Take your pain medication as prescribed.  i. If you are having problems/concerns with the prescription medicine (does not control pain, nausea, vomiting, rash, itching, etc), please call us [phone] to see if we need to switch you to a different pain medicine that will work better for you and/or control your side effect better. ii. If you need a refill on your pain medication,  please contact your pharmacy.  They will contact our office to request authorization. Prescriptions will not be filled after 5 pm or on week-ends. 4. Avoid getting constipated.  Between the surgery and the pain medications, it is common to experience some constipation.  Increasing fluid intake and taking a fiber supplement (such as Metamucil, Citrucel, FiberCon, MiraLax, etc) 1-2 times a day regularly will usually help prevent this problem from occurring.  A mild laxative (prune juice, Milk of Magnesia, MiraLax, etc) should be taken according to package directions if there are no bowel movements after 48 hours.   5. Watch out for diarrhea.  If you have many loose bowel movements, simplify your diet to bland foods & liquids for a few days.  Stop any stool softeners and decrease your fiber supplement.  Switching to mild anti-diarrheal medications (Kayopectate, Pepto Bismol) can help.  If this worsens or does not improve, please call us. 6. Wash / shower every day.  You may shower over the dressings as they are waterproof.  Continue to shower over incision(s) after the dressing is off. 7. Remove your waterproof bandages 3 days after surgery.  You may leave the incision open to air.  You may replace a dressing/Band-Aid to cover the incision for comfort if you wish.  8. ACTIVITIES as tolerated:   a. You may resume regular (light) daily activities beginning the next day--such as daily self-care, walking, climbing stairs--gradually increasing activities as tolerated.  If you can walk 30 minutes without difficulty, it  is safe to try more intense activity such as jogging, treadmill, bicycling, low-impact aerobics, swimming, etc.   Refrain from any heavy lifting or straining until you are off narcotics for pain control and Dr. Johney [place] says it is okay. b. DO NOT PUSH THROUGH PAIN.  Let pain be your guide: If it hurts to do something, don't do it.  Pain is your body warning you to avoid that activity for another week  until the pain goes down. c. You may drive when you are no longer taking prescription pain medication, you can comfortably wear a seatbelt, and you can safely maneuver your car and apply brakes. d. Dennis Bast may have sexual intercourse when it is comfortable.  9. FOLLOW UP in our office a. Please call [facility] at [phone] to set up an appointment to see your surgeon in the office for a follow-up appointment approximately 2-3 weeks after your surgery. b. Make sure that you call for this appointment the day you arrive home to insure a convenient appointment time. 10. IF YOU HAVE DISABILITY OR FAMILY LEAVE FORMS, BRING THEM TO THE OFFICE FOR PROCESSING.  DO NOT GIVE THEM TO YOUR DOCTOR.   WHEN TO CALL us [phone]: 1. Poor pain control 2. Reactions / problems with new medications (rash/itching, nausea, etc)  3. Fever over 101.5 F (38.5 C) 4. Inability to urinate 5. Nausea and/or vomiting 6. Worsening swelling or bruising 7. Continued bleeding from incision. 8. Increased pain, redness, or drainage from the incision   The clinic staff is available to answer your questions during regular business hours (8:30am-5pm).  Please dont hesitate to call and ask to speak to one of our nurses for clinical concerns.   If you have a medical emergency, go to the nearest emergency room or call 911.  A surgeon from [facility] is always on call at the [facility], [place], [place], [place], [place]  [address] ? MAIN: [phone] ? TOLL FREE: [phone] ?  FAX [phone] [email or web address]  Managing Pain  Pain after surgery or related to activity is often due to strain/injury to muscle, tendon, nerves and/or incisions.  This pain is usually short-term and will improve in a few months.   Many people find it helpful to do the following things TOGETHER to help speed the process of healing and to get back to regular activity more  quickly:  1. Avoid heavy physical activity a.  no lifting greater than 20 pounds b. Do not push through the pain.  Listen to your body and avoid positions and maneuvers than reproduce the pain c. Walking is okay as tolerated, but go slowly and stop when getting sore.  d. Remember: If it hurts to do it, then dont do it! 2. Take Anti-inflammatory medication  a. Take with food/snack around the clock for 1-2 weeks i. This helps the muscle and nerve tissues become less irritable and calm down faster b. Choose ONE of the following over-the-counter medications: i. Naproxen 220mg  tabs (ex. Aleve) 1-2 pills twice a day  ii. Ibuprofen 200mg  tabs (ex. Advil, Motrin) 3-4 pills with every meal and just before bedtime iii. Acetaminophen 500mg  tabs (Tylenol) 1-2 pills with every meal and just before bedtime 3. Use a Heating pad or Ice/Cold Pack a. 4-6 times a day b. May use warm bath/hottub  or showers 4. Try Gentle Massage and/or Stretching  a. at the area of pain many times a day b. stop if  you feel pain - do not overdo it  Try these steps together to help you body heal faster and avoid making things get worse.  Doing just one of these things may not be enough.    If you are not getting better after two weeks or are noticing you are getting worse, contact our office for further advice; we may need to re-evaluate you & see what other things we can do to help.  GETTING TO GOOD BOWEL HEALTH. Irregular bowel habits such as constipation and diarrhea can lead to many problems over time.  Having one soft bowel movement a day is the most important way to prevent further problems.  The anorectal canal is designed to handle stretching and feces to safely manage our ability to get rid of solid waste (feces, poop, stool) out of our body.  BUT, hard constipated stools can act like ripping concrete bricks and diarrhea can be a burning fire to this very sensitive area of our body, causing inflamed hemorrhoids, anal  fissures, increasing risk is perirectal abscesses, abdominal pain/bloating, an making irritable bowel worse.     The goal: ONE SOFT BOWEL MOVEMENT A DAY!  To have soft, regular bowel movements:   Drink at least 8 tall glasses of water a day.    Take plenty of fiber.  Fiber is the undigested part of plant food that passes into the colon, acting s natures broom to encourage bowel motility and movement.  Fiber can absorb and hold large amounts of water. This results in a larger, bulkier stool, which is soft and easier to pass. Work gradually over several weeks up to 6 servings a day of fiber (25g a day even more if needed) in the form of: o Vegetables -- Root (potatoes, carrots, turnips), leafy green (lettuce, salad greens, celery, spinach), or cooked high residue (cabbage, broccoli, etc) o Fruit -- Fresh (unpeeled skin & pulp), Dried (prunes, apricots, cherries, etc ),  or stewed ( applesauce)  o Whole grain breads, pasta, etc (whole wheat)  o Bran cereals   Bulking Agents -- This type of water-retaining fiber generally is easily obtained each day by one of the following:  o Psyllium bran -- The psyllium plant is remarkable because its ground seeds can retain so much water. This product is available as Metamucil, Konsyl, Effersyllium, Per Diem Fiber, or the less expensive generic preparation in drug and health food stores. Although labeled a laxative, it really is not a laxative.  o Methylcellulose -- This is another fiber derived from wood which also retains water. It is available as Citrucel. o Polyethylene Glycol - and artificial fiber commonly called Miralax or Glycolax.  It is helpful for people with gassy or bloated feelings with regular fiber o Flax Seed - a less gassy fiber than psyllium  No reading or other relaxing activity while on the toilet. If bowel movements take longer than 5 minutes, you are too constipated  AVOID CONSTIPATION.  High fiber and water intake usually takes care of  this.  Sometimes a laxative is needed to stimulate more frequent bowel movements, but   Laxatives are not a good long-term solution as it can wear the colon out. o Osmotics (Milk of Magnesia, Fleets phosphosoda, Magnesium citrate, MiraLax, GoLytely) are safer than  o Stimulants (Senokot, Castor Oil, Dulcolax, Ex Lax)    o Do not take laxatives for more than 7days in a row.   IF SEVERELY CONSTIPATED, try a Bowel Retraining Program: o Do not use laxatives.  o Eat a diet high in roughage, such as bran cereals and leafy vegetables.  o Drink six (6) ounces of prune or apricot juice each morning.  o Eat two (2) large servings of stewed fruit each day.  o Take one (1) heaping tablespoon of a psyllium-based bulking agent twice a day. Use sugar-free sweetener when possible to avoid excessive calories.  o Eat a normal breakfast.  o Set aside 15 minutes after breakfast to sit on the toilet, but do not strain to have a bowel movement.  o If you do not have a bowel movement by the third day, use an enema and repeat the above steps.   Controlling diarrhea o Switch to liquids and simpler foods for a few days to avoid stressing your intestines further. o Avoid dairy products (especially milk & ice cream) for a short time.  The intestines often can lose the ability to digest lactose when stressed. o Avoid foods that cause gassiness or bloating.  Typical foods include beans and other legumes, cabbage, broccoli, and dairy foods.  Every person has some sensitivity to other foods, so listen to our body and avoid those foods that trigger problems for you. o Adding fiber (Citrucel, Metamucil, psyllium, Miralax) gradually can help thicken stools by absorbing excess fluid and retrain the intestines to act more normally.  Slowly increase the dose over a few weeks.  Too much fiber too soon can backfire and cause cramping & bloating. o Probiotics (such as active yogurt, Align, etc) may help repopulate the intestines and  colon with normal bacteria and calm down a sensitive digestive tract.  Most studies show it to be of mild help, though, and such products can be costly. o Medicines: - Bismuth subsalicylate (ex. Kayopectate, Pepto Bismol) every 30 minutes for up to 6 doses can help control diarrhea.  Avoid if pregnant. - Loperamide (Immodium) can slow down diarrhea.  Start with two tablets (4mg  total) first and then try one tablet every 6 hours.  Avoid if you are having fevers or severe pain.  If you are not better or start feeling worse, stop all medicines and call your doctor for advice o Call your doctor if you are getting worse or not better.  Sometimes further testing (cultures, endoscopy, X-ray studies, bloodwork, etc) may be needed to help diagnose and treat the cause of the diarrhea.  Cholecystitis Cholecystitis is an inflammation of your gallbladder. It is usually caused by a buildup of gallstones or sludge (cholelithiasis) in your gallbladder. The gallbladder stores a fluid that helps digest fats (bile). Cholecystitis is serious and needs treatment right away.  CAUSES   Gallstones. Gallstones can block the tube that leads to your gallbladder, causing bile to build up. As bile builds up, the gallbladder becomes inflamed.  Bile duct problems, such as blockage from scarring or kinking.  Tumors. Tumors can stop bile from leaving your gallbladder correctly, causing bile to build up. As bile builds up, the gallbladder becomes inflamed. SYMPTOMS   Nausea.  Vomiting.  Abdominal pain, especially in the upper right area of your abdomen.  Abdominal tenderness or bloating.  Sweating.  Chills.  Fever.  Yellowing of the skin and the whites of the eyes (jaundice). DIAGNOSIS  Your caregiver may order blood tests to look for infection or gallbladder problems. Your caregiver may also order imaging tests, such as an ultrasound or computed tomography (CT) scan. Further tests may include a hepatobiliary  iminodiacetic acid (HIDA) scan. This scan allows your caregiver to see  your bile move from the liver to the gallbladder and to the small intestine. TREATMENT  A hospital stay is usually necessary to lessen the inflammation of your gallbladder. You may be required to not eat or drink (fast) for a certain amount of time. You may be given medicine to treat pain or an antibiotic medicine to treat an infection. Surgery may be needed to remove your gallbladder (cholecystectomy) once the inflammation has gone down. Surgery may be needed right away if you develop complications such as death of gallbladder tissue (gangrene) or a tear (perforation) of the gallbladder.  [place] care will depend on your treatment. In general:  If you were given antibiotics, take them as directed. Finish them even if you start to feel better.  Only take over-the-counter or prescription medicines for pain, discomfort, or fever as directed by your caregiver.  Follow a low-fat diet until you see your caregiver again.  Keep all follow-up visits as directed by your caregiver. SEEK IMMEDIATE MEDICAL CARE IF:   Your pain is increasing and not controlled by medicines.  Your pain moves to another part of your abdomen or to your back.  You have a fever.  You have nausea and vomiting. MAKE SURE YOU:  Understand these instructions.  Will watch your condition.  Will get help right away if you are not doing well or get worse. Document Released: 10/18/2005 Document Revised: 01/10/2012 Document Reviewed: 09/03/2011 [facility] Patient Information 2015 [place], [place]. This information is not intended to replace advice given to you by your health care provider. Make sure you discuss any questions you have with your health care provider.

## 2014-12-02 NOTE — Progress Notes (Signed)
2 Days Post-Op  Subjective: Feels good.  Minimal pain.  Walking.  Tolerating diet.  Objective: Vital signs in last 24 hours: Temp:  [98.1 F (36.7 C)-98.6 F (37 C)] 98.1 F (36.7 C) (02/01 0555) Pulse Rate:  [71-89] 89 (02/01 0555) Resp:  [14-24] 14 (02/01 0555) BP: (121-137)/(71-89) 121/74 mmHg (02/01 0555) SpO2:  [73 %-100 %] 73 % (02/01 0555) Last BM Date: 11/28/14  Intake/Output from previous day: 01/31 0701 - 02/01 0700 In: 840 [P.O.:840] Out: 3200 [Urine:3200] Intake/Output this shift:    PE: General- In NAD Abdomen-soft, dressing dry  Lab Results:   Recent Labs  12/01/14 0430 12/02/14 0445  WBC 11.2* 15.6*  HGB 7.9* 7.6*  HCT 22.1* 21.9*  PLT 423* 453*   BMET  Recent Labs  12/01/14 0430 12/01/14 1327  NA 134* 136  K 3.7 3.0*  CL 102 103  CO2 27 26  GLUCOSE 119* 121*  BUN 5* <5*  CREATININE 0.44* 0.50  CALCIUM 8.8 8.7   PT/INR  Recent Labs  11/29/14 2030  LABPROT 15.4*  INR 1.21   Comprehensive Metabolic Panel:    Component Value Date/Time   NA 136 12/01/2014 1327   NA 134* 12/01/2014 0430   K 3.0* 12/01/2014 1327   K 3.7 12/01/2014 0430   CL 103 12/01/2014 1327   CL 102 12/01/2014 0430   CO2 26 12/01/2014 1327   CO2 27 12/01/2014 0430   BUN <5* 12/01/2014 1327   BUN 5* 12/01/2014 0430   CREATININE 0.50 12/01/2014 1327   CREATININE 0.44* 12/01/2014 0430   GLUCOSE 121* 12/01/2014 1327   GLUCOSE 119* 12/01/2014 0430   CALCIUM 8.7 12/01/2014 1327   CALCIUM 8.8 12/01/2014 0430   AST 49* 12/01/2014 1327   AST 64* 12/01/2014 0430   ALT 58* 12/01/2014 1327   ALT 63* 12/01/2014 0430   ALKPHOS 63 12/01/2014 1327   ALKPHOS 65 12/01/2014 0430   BILITOT 1.6* 12/01/2014 1327   BILITOT 1.4* 12/01/2014 0430   PROT 6.7 12/01/2014 1327   PROT 6.9 12/01/2014 0430   ALBUMIN 3.4* 12/01/2014 1327   ALBUMIN 3.5 12/01/2014 0430     Studies/Results: Dg Cholangiogram Operative  11/30/2014   CLINICAL DATA:  Intraoperative cholangiogram   EXAM: INTRAOPERATIVE CHOLANGIOGRAM  TECHNIQUE: Cholangiographic images from the C-arm fluoroscopic device were submitted for interpretation post-operatively. Please see the procedural report for the amount of contrast and the fluoroscopy time utilized.  COMPARISON:  None.  FINDINGS: Two submitted images show minimal contrast in the right upper quadrant. Ducts not well defined on the 2 submitted images.  IMPRESSION: Limited imaging from intraoperative cholangiogram. Please refer to the procedure report for complete description.   Electronically Signed   By: Lajean Manes M.D.   On: 11/30/2014 11:05    Anti-infectives: Anti-infectives    Start     Dose/Rate Route Frequency Ordered Stop   11/30/14 0600  cefTRIAXone (ROCEPHIN) 2 g in dextrose 5 % 50 mL IVPB - Premix  Status:  Discontinued    Comments:  Pharmacy may adjust dosing strength / duration / interval for maximal efficacy   2 g100 mL/hr over 30 Minutes Intravenous Every 24 hours 11/30/14 0229 12/01/14 0928   11/29/14 1600  piperacillin-tazobactam (ZOSYN) IVPB 3.375 g  Status:  Discontinued     3.375 g12.5 mL/hr over 240 Minutes Intravenous Every 8 hours 11/29/14 1402 11/30/14 0229   11/29/14 1415  piperacillin-tazobactam (ZOSYN) IVPB 3.375 g  Status:  Discontinued     3.375 g12.5 mL/hr over  240 Minutes Intravenous 4 times per day 11/29/14 1400 11/29/14 1401      Assessment Principal Problem:   Acute calculous cholecystitis s/p lap chole 11/30/2014-progressing well Active Problems:   Sickle cell pain crisis   Leukocytosis   Atypical chest pain   Elevated LFTs - trending down    LOS: 4 days   Plan: Okay for discharge from our standpoint.  Follow up with Dr. Johney [place] in 2-3 weeks.   Carmen [name] J 12/02/2014

## 2014-12-02 NOTE — Discharge Summary (Signed)
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 Schedule an appointment as soon as possible for a visit in 3 weeks.   Why:  To follow up after your operation, To follow up after your hospital stay   Contact information:   [place] [id number] [phone]      Follow up with [name], MD In 2 weeks.   Specialty:  Family Medicine   Contact information:   [place] [address] [phone]       Follow up with MATTHEWS,MICHELLE A., MD.   Specialty:  Internal Medicine   Why:  Since you have requested change in PCP   Contact information:   [place] [address] [phone]       Diet:Soft Diet. May advance to regular diet as tolerated.  Wt Readings from Last 3 Encounters:  11/28/14 197 lb 3.2 oz (89.449 kg)  05/16/14 198 lb 6.6 oz (90 kg)  03/28/14 186 lb 12.8 oz (84.732 kg)    History of present illness:  24 year old female with a history of sickle cell disease, depression presents with her typical sickle cell pain manifested by chest discomfort and upper back pain. The patient was recently discharged from the hospital on 11/24/2014 after an admission for sickle cell crisis. She stated that she was not discharged with any opioids at that time. She actually came back to the emergency department at [facility] on 11/25/2014. She was discharged from the emergency department  with Percocet. She states that she was given 10 Percocet and has taken all 10 without relief of her chest discomfort or back pain. The patient denies any recent injuries. She also complains of cough with yellow sputum. She denies any hemoptysis. She had some subjective fevers and chills at home but has been afebrile since admission. She complains of some nausea, but have one episode of emesis in the emergency department. She denies any dysuria, hematuria, diarrhea, hematochezia, melena, vaginal discharge. In the emergency department, the patient was afebrile and hemodynamically stable. Oxygen saturation was 99% on room air. Labs showed WBC 12.1, hemoglobin 7.9, reticulocyte count 14.4%. Potassium was 2.8. Hepatic enzymes were mildly elevated with AST 33, ALT 53, alk phosphatase 67, total bilirubin 1.8. Chest x-ray was unremarkable. EKG showed sinus rhythm without concerning ST-T wave changes. The patient received 4 mg Dilaudid in the emergency department without much relief although the patient was resting comfortably when I arrived. Urine pregnancy test was negative  Hospital Course by Problem:  Cholecystitis post cholecystectomy: Pt had RUQ tenderness with referred right scapular pain. In addition, to elevated LFTs. Her abdominal ultrasound confirmed findings of cholecystitis. General Surgery was consulted an pt had a cholecystectomy performed on 11/30/14. She did well in the post-op period. Her diet was advanced to soft food prior to discharge. Pt advised to advance to regular diet as tolerated. She is to follow-up with general surgery in 2-3 weeks.  Sickle Cell Crisis: Patient presented  with pain characteristic of acute vaso-occlusive crisis.Pt's pain was treated with bolus IV analgesics initially and was later transitioned with a Dilaudid PCA and ketorolac. Most of her pain was likely attributed to her cholecystitis, and improved with cholecystectomy. Her pain was well controlled with her oral home regimen  without much need for IV pain control. She had overall improvement of her pain and was physically functional upon discharge.  Anemia: Pt's Hgb was stable around her baseline of 7-8. Due to surgery she was given 1 unit PRBC to decrease her post-operative risk. Pt advised to go to ED if feeling dizzy, having SOB, Heart racing/palpitations, and/or bleeding.    Discharge Exam: Filed Vitals:   12/02/14 0555  BP: 121/74  Pulse: 89  Temp: 98.1 F (36.7 C)  Resp: 14   Filed Vitals:   12/01/14 1715 12/01/14 2029 12/02/14 0215 12/02/14 0555  BP: 136/71 130/88 125/84 121/74  Pulse: 88 71 89 89  Temp: 98.1 F (36.7 C) 98.3 F (36.8 C) 98.3 F (36.8 C) 98.1 F (36.7 C)  TempSrc: Oral Oral Oral Oral  Resp: _0 Height:      Weight:      SpO2: 99% 90% 93% 73%   General: Alert, awake, oriented x3, in no acute distress.  HEENT: [place]/AT PEERL, EOMI Neck: Trachea midline,  no masses, no thyromegaly no JVD, no carotid bruit OROPHARYNX:  Moist, No exudate/ erythema/lesions.  Heart: Regular rate and rhythm, without murmurs, rubs, gallops Lungs: Clear to auscultation, no wheezing or rhonchi noted.  Abdomen: Soft, nontender, nondistended, positive bowel sounds, no masses no hepatosplenomegaly noted. Umbilical dressing is c/d/i Neuro: No focal neurological deficits noted cranial nerves II through XII grossly intact. 5/5/UE and LE strength. Extremities: No clubbing, cyanosis, or edema. Musculoskeletal: no joint erythema, edema, or tenderness.    Discharge Instructions  Discharge Instructions    Call MD for:  extreme fatigue    Complete by:  As directed      Call MD for:  hives    Complete by:  As directed      Call MD for:  persistant nausea and vomiting    Complete by:  As directed      Call MD for:  redness, tenderness, or signs of infection (pain, swelling, redness, odor or green/yellow discharge around incision site)    Complete by:  As directed      Call MD for:  severe  uncontrolled pain    Complete by:  As directed      Call MD for:    Complete by:  As directed   Temperature > 101.47F     Diet - low sodium heart healthy    Complete by:  As directed      Discharge instructions    Complete by:  As directed   Please see discharge instruction sheets.  Also refer to handout given an office.  Please call our office if you have any questions or concerns [phone]     Discharge wound care:    Complete by:  As directed   If you have closed incisions, shower and bathe over these incisions with soap and water every day.  Remove all surgical dressings on postoperative day #3.  You do not need to replace dressings over the closed incisions unless you feel more comfortable with a Band-Aid covering it.   If you have an open wound that requires packing, please see wound care instructions.  In general, remove all dressings, wash  wound with soap and water and then replace with saline moistened gauze.  Do the dressing change at least every day.  Please call our office [phone] if you have further questions.     Driving Restrictions    Complete by:  As directed   No driving until off narcotics and can safely swerve away without pain during an emergency     Increase activity slowly    Complete by:  As directed   Walk an hour a day.  Use 20-30 minute walks.  When you can walk 30 minutes without difficulty, increase to low impact/moderate activities such as biking, jogging, swimming, sexual activity..  Eventually can increase to unrestricted activity when not feeling pain.  If you feel pain: STOP!Marland Kitchen   Let pain protect you from overdoing it.  Use ice/heat/over-the-counter pain medications to help minimize his soreness.  Use pain prescriptions as needed to remain active.  It is better to take extra pain medications and be more active than to stay bedridden to avoid all pain medications.     Lifting restrictions    Complete by:  As directed   Avoid heavy lifting initially.  Do  not push through pain.  You have no specific weight limit.  Coughing and sneezing or four more stressful to your incision than any lifting you will do. Pain will protect you from injury.  Therefore, avoid intense activity until off all narcotic pain medications.  Coughing and sneezing or four more stressful to your incision than any lifting he will do.     May shower / Bathe    Complete by:  As directed      May walk up steps    Complete by:  As directed      Sexual Activity Restrictions    Complete by:  As directed   Sexual activity as tolerated.  Do not push through pain.  Pain will protect you from injury.     Walk with assistance    Complete by:  As directed   Walk over an hour a day.  May use a walker/cane/companion to help with balance and stamina.            Medication List    TAKE these medications        cetirizine 10 MG tablet  Commonly known as:  ZYRTEC  Take 10 mg by mouth daily.     diphenhydramine-acetaminophen 25-500 MG Tabs  Commonly known as:  TYLENOL PM  Take 2 tablets by mouth as needed (pain/sleep).     HYDROcodone-acetaminophen 10-325 MG per tablet  Commonly known as:  NORCO  Take 1 tablet by mouth every 6 (six) hours as needed (pain).     hydroxyurea 500 MG capsule  Commonly known as:  HYDREA  Take 1 capsule (500 mg total) by mouth 2 (two) times daily. May take with food to minimize GI side effects.     oxyCODONE-acetaminophen 5-325 MG per tablet  Commonly known as:  PERCOCET  Take 1 tablet by mouth every 6 (six) hours as needed.          The results of significant diagnostics from this hospitalization (including imaging, microbiology, ancillary and laboratory) are listed below for reference.    Significant Diagnostic Studies: Dg Chest 2 View  11/28/2014   CLINICAL DATA:  Initial encounter for sickle cell pain crisis. Shortness of breath and cough.  EXAM: CHEST  2 VIEW  COMPARISON:  11/24/2014  FINDINGS: Lumbar spine endplate irregularity,  consistent with the  clinical history of sickle cell.  Midline trachea. Normal heart size and mediastinal contours. No pleural effusion or pneumothorax. mild lower lobe predominant interstitial thickening is chronic and nonspecific. Minimal volume loss at the right lung base. No lobar consolidation.  IMPRESSION: No acute cardiopulmonary disease.   Electronically Signed   By: Abigail Miyamoto M.D.   On: 11/28/2014 16:17   Dg Chest 2 View  11/24/2014   CLINICAL DATA:  Sickle cell crisis for 2 days.  EXAM: CHEST  2 VIEW  COMPARISON:  04/03/2014  FINDINGS: Normal heart, mediastinum hila. Clear lungs. No pleural effusion or pneumothorax. Bony thorax is unremarkable.  IMPRESSION: Normal chest radiographs.   Electronically Signed   By: Lajean Manes M.D.   On: 11/24/2014 17:46   Dg Cholangiogram Operative  11/30/2014   CLINICAL DATA:  Intraoperative cholangiogram  EXAM: INTRAOPERATIVE CHOLANGIOGRAM  TECHNIQUE: Cholangiographic images from the C-arm fluoroscopic device were submitted for interpretation post-operatively. Please see the procedural report for the amount of contrast and the fluoroscopy time utilized.  COMPARISON:  None.  FINDINGS: Two submitted images show minimal contrast in the right upper quadrant. Ducts not well defined on the 2 submitted images.  IMPRESSION: Limited imaging from intraoperative cholangiogram. Please refer to the procedure report for complete description.   Electronically Signed   By: Lajean Manes M.D.   On: 11/30/2014 11:05   US Abdomen Limited Ruq  11/29/2014   CLINICAL DATA:  Abdominal pain and elevated LFTs.  EXAM: US ABDOMEN LIMITED - RIGHT UPPER QUADRANT  COMPARISON:  None.  FINDINGS: Gallbladder:  Gallbladder wall is markedly thickened measuring 13.1 mm. There are multiple stones within the gallbladder which measure up to 1 cm. (Note: Patient on morphine pump and technologist could not reliably assess for sonographic Murphy's sign. Never the less, sonographer reports tenderness  in the right upper quadrant).  Common bile duct:  Diameter: 3.1 mm.  Liver:  No focal liver abnormality. Mild intrahepatic biliary prominence. No focal liver abnormality.  Other:  Small right effusion noted.  IMPRESSION: 1. Gallstones and gallbladder wall thickening. Findings are concerning for cholecystitis.   Electronically Signed   By: Kerby Moors M.D.   On: 11/29/2014 09:43    Microbiology: Recent Results (from the past 240 hour(s))  MRSA PCR Screening     Status: Abnormal   Collection Time: 11/30/14  7:22 AM  Result Value Ref Range Status   MRSA by PCR INVALID RESULTS, SPECIMEN SENT FOR CULTURE (A) NEGATIVE Final    Comment:        The GeneXpert MRSA Assay (FDA approved for NASAL specimens only), is one component of a comprehensive MRSA colonization surveillance program. It is not intended to diagnose MRSA infection nor to guide or monitor treatment for MRSA infections. RESULT CALLED TO, READ BACK BY AND VERIFIED WITH: Annamary Carolin [id number] @ 0454 BY J SCOTTON   MRSA culture     Status: None (Preliminary result)   Collection Time: 11/30/14  7:22 AM  Result Value Ref Range Status   Specimen Description NOSE  Final   Special Requests NONE  Final   Culture   Final    NO SUSPICIOUS COLONIES, CONTINUING TO HOLD Performed at [facility]    Report Status PENDING  Incomplete     Labs: Basic Metabolic Panel:  Recent Labs Lab 11/28/14 1617 11/29/14 0440 11/30/14 0746 12/01/14 0430 12/01/14 1327  NA 139 142 140 134* 136  K 2.8* 3.6 3.3* 3.7 3.0*  CL 109 111 106 102  103  CO2 _0 GLUCOSE 93 92 107* 119* 121*  BUN 6 6 <5* 5* <5*  CREATININE 0.50 0.49* 0.44* 0.44* 0.50  CALCIUM 9.0 8.5 8.6 8.8 8.7  MG  --  1.8  --   --   --    Liver Function Tests:  Recent Labs Lab 11/28/14 1617 11/29/14 0440 11/30/14 0746 12/01/14 0430 12/01/14 1327  AST 33 25 29 64* 49*  ALT 53* 41* 31 63* 58*  ALKPHOS 67 60 58 65 63  BILITOT 1.8* 1.7* 1.8* 1.4*  1.6*  PROT 7.5 6.5 6.7 6.9 6.7  ALBUMIN 3.9 3.5 3.5 3.5 3.4*    Recent Labs Lab 11/30/14 0746 12/01/14 0430  LIPASE 15 15   No results for input(s): AMMONIA in the last 168 hours. CBC:  Recent Labs Lab 11/28/14 1617 11/28/14 2112 11/30/14 0746 11/30/14 1352 11/30/14 1823 12/01/14 0430 12/02/14 0445  WBC 12.1* 12.9* 9.9  --   --  11.2* 15.6*  NEUTROABS 7.4 8.0*  --   --   --  7.6 7.4  HGB 7.9* 7.0* 7.1* 7.3* 8.5* 7.9* 7.6*  HCT 22.6* 19.9* 19.8* 20.6* 24.1* 22.1* 21.9*  MCV 84.6 85.0 83.9  --   --  83.4 85.5  PLT 623* 515* 480*  --   --  423* 453*   Cardiac Enzymes:  Recent Labs Lab 11/28/14 2112 11/29/14 0440 11/29/14 1113  TROPONINI <0.03 <0.03 <0.03   BNP: Invalid input(s): POCBNP CBG: No results for input(s): GLUCAP in the last 168 hours.  Time coordinating discharge: >30 min.  SignedKalman Shan Sickle Cell Service 12/02/2014, 12:29 PM

## 2014-12-03 LAB — TYPE AND SCREEN
ABO/RH(D): O POS
ANTIBODY SCREEN: NEGATIVE
UNIT DIVISION: 0
Unit division: 0

## 2015-01-01 ENCOUNTER — Telehealth: Payer: Self-pay | Admitting: Family Medicine

## 2015-01-01 NOTE — Telephone Encounter (Signed)
Left message for patient to call to schedule appointment for primary care .

## 2015-01-02 ENCOUNTER — Telehealth: Payer: Self-pay | Admitting: Family Medicine

## 2015-01-02 NOTE — Telephone Encounter (Signed)
Left message for patient to call to schedule initial appointment.

## 2015-02-12 ENCOUNTER — Emergency Department (HOSPITAL_COMMUNITY)
Admission: EM | Admit: 2015-02-12 | Discharge: 2015-02-12 | Disposition: A | Discharge status: Home / Self Care | Payer: Medicaid Other | Attending: Emergency Medicine | Admitting: Emergency Medicine

## 2015-02-12 ENCOUNTER — Encounter (HOSPITAL_COMMUNITY): Payer: Self-pay | Admitting: Emergency Medicine

## 2015-02-12 DIAGNOSIS — D5700 Hb-SS disease with crisis, unspecified: Secondary | ICD-10-CM | POA: Insufficient documentation

## 2015-02-12 DIAGNOSIS — Z79899 Other long term (current) drug therapy: Secondary | ICD-10-CM | POA: Diagnosis not present

## 2015-02-12 DIAGNOSIS — Z8659 Personal history of other mental and behavioral disorders: Secondary | ICD-10-CM | POA: Insufficient documentation

## 2015-02-12 LAB — RETICULOCYTES
RBC.: 3.29 MIL/uL — ABNORMAL LOW (ref 3.87–5.11)
RETIC COUNT ABSOLUTE: 184.2 10*3/uL (ref 19.0–186.0)
Retic Ct Pct: 5.6 % — ABNORMAL HIGH (ref 0.4–3.1)

## 2015-02-12 LAB — CBC WITH DIFFERENTIAL/PLATELET
Basophils Absolute: 0 10*3/uL (ref 0.0–0.1)
Basophils Relative: 0 % (ref 0–1)
EOS PCT: 1 % (ref 0–5)
Eosinophils Absolute: 0.1 10*3/uL (ref 0.0–0.7)
HEMATOCRIT: 28 % — AB (ref 36.0–46.0)
Hemoglobin: 9.7 g/dL — ABNORMAL LOW (ref 12.0–15.0)
LYMPHS PCT: 32 % (ref 12–46)
Lymphs Abs: 3.7 10*3/uL (ref 0.7–4.0)
MCH: 29.5 pg (ref 26.0–34.0)
MCHC: 34.6 g/dL (ref 30.0–36.0)
MCV: 85.1 fL (ref 78.0–100.0)
Monocytes Absolute: 1.2 10*3/uL — ABNORMAL HIGH (ref 0.1–1.0)
Monocytes Relative: 11 % (ref 3–12)
Neutro Abs: 6.4 10*3/uL (ref 1.7–7.7)
Neutrophils Relative %: 56 % (ref 43–77)
PLATELETS: 633 10*3/uL — AB (ref 150–400)
RBC: 3.29 MIL/uL — ABNORMAL LOW (ref 3.87–5.11)
RDW: 14.8 % (ref 11.5–15.5)
WBC: 11.4 10*3/uL — ABNORMAL HIGH (ref 4.0–10.5)

## 2015-02-12 LAB — COMPREHENSIVE METABOLIC PANEL
ALBUMIN: 4.5 g/dL (ref 3.5–5.2)
ALT: 36 U/L — ABNORMAL HIGH (ref 0–35)
AST: 39 U/L — ABNORMAL HIGH (ref 0–37)
Alkaline Phosphatase: 68 U/L (ref 39–117)
Anion gap: 7 (ref 5–15)
BUN: 11 mg/dL (ref 6–23)
CHLORIDE: 106 mmol/L (ref 96–112)
CO2: 23 mmol/L (ref 19–32)
CREATININE: 0.49 mg/dL — AB (ref 0.50–1.10)
Calcium: 9.2 mg/dL (ref 8.4–10.5)
GFR calc Af Amer: >90 mL/min (ref >90)
GFR calc non Af Amer: >90 mL/min (ref >90)
GLUCOSE: 93 mg/dL (ref 70–99)
Potassium: 4.1 mmol/L (ref 3.5–5.1)
Sodium: 136 mmol/L (ref 135–145)
Total Bilirubin: 1.4 mg/dL — ABNORMAL HIGH (ref 0.3–1.2)
Total Protein: 8.6 g/dL — ABNORMAL HIGH (ref 6.0–8.3)

## 2015-02-12 MED ORDER — HYDROMORPHONE HCL 2 MG/ML IJ SOLN
2.0000 mg | Freq: Once | INTRAMUSCULAR | Status: AC
  Administered 2015-02-12: 2 mg via INTRAVENOUS
  Filled 2015-02-12: qty 1

## 2015-02-12 MED ORDER — HYDROMORPHONE HCL 2 MG/ML IJ SOLN
2.0000 mg | INTRAMUSCULAR | Status: AC | PRN
  Administered 2015-02-12 (×2): 2 mg via INTRAVENOUS
  Filled 2015-02-12 (×2): qty 1

## 2015-02-12 MED ORDER — KETOROLAC TROMETHAMINE 30 MG/ML IJ SOLN
30.0000 mg | Freq: Once | INTRAMUSCULAR | Status: AC
  Administered 2015-02-12: 30 mg via INTRAVENOUS
  Filled 2015-02-12: qty 1

## 2015-02-12 MED ORDER — DIPHENHYDRAMINE HCL 50 MG/ML IJ SOLN
25.0000 mg | Freq: Four times a day (QID) | INTRAMUSCULAR | Status: DC | PRN
  Administered 2015-02-12: 25 mg via INTRAVENOUS
  Filled 2015-02-12: qty 1

## 2015-02-12 MED ORDER — ONDANSETRON HCL 4 MG/2ML IJ SOLN
4.0000 mg | Freq: Once | INTRAMUSCULAR | Status: AC
  Administered 2015-02-12: 4 mg via INTRAVENOUS
  Filled 2015-02-12: qty 2

## 2015-02-12 NOTE — ED Notes (Signed)
While administering pain medication pt has an episode of emesis, asked if she allergic to Zofran to which she says no, requested attending EDP for Zofran. Please new order.

## 2015-02-12 NOTE — ED Provider Notes (Signed)
Patient's pain is controlled. She would like to go home. Reviewed lab work which is noncontributory. Recommend patient return for any worsening.  Clinical Impression 1. Sickle cell pain crisis      Pamella Pert, MD 02/12/15 [phone]

## 2015-02-12 NOTE — ED Notes (Signed)
I attempted to collect labs on patient and was unsuccessful.  I made nurse aware.

## 2015-02-12 NOTE — ED Provider Notes (Signed)
CSN: [id number]     Arrival date & time 02/12/15  1326 History   First MD Initiated Contact with Patient 02/12/15 1403     Chief Complaint  Patient presents with  . Sickle Cell Pain Crisis      HPI Patient presents to emergency department complaining of sickle cell related pain.  Her pain is in her low back and across her shoulder blades.  She states this is typical for sickle cell crisis type pain.  No fevers or chills.  No chest pain or shortness of breath.  Denies abdominal pain nausea and vomiting.  Denies diarrhea.  No urinary complaints.  Pain is moderate to severe in severity.  She tried her home pain medication which includes 10 mg hydrocodone without improvement.   Past Medical History  Diagnosis Date  . Sickle cell crisis   . Depression   . Headache(784.0)   . Abortion     05/2012   Past Surgical History  Procedure Laterality Date  . Splenectomy    . Cholecystectomy N/A 11/30/2014    Procedure: LAPAROSCOPIC CHOLECYSTECTOMY SINGLE SITE WITH INTRAOPERATIVE CHOLANGIOGRAM;  Surgeon: Michael Boston, MD;  Location: [facility];  Service: General;  Laterality: N/A;   Family History  Problem Relation Age of Onset  . Hypertension Mother   . Sickle cell anemia Sister   . Kidney disease Sister   . Arthritis Sister   . Sickle cell anemia Sister   . Sickle cell trait Sister   . Heart disease Maternal Aunt   . Heart disease Maternal Uncle    History  Substance Use Topics  . Smoking status: Never Smoker   . Smokeless tobacco: Never Used  . Alcohol Use: No   OB History    Gravida Para Term Preterm AB TAB SAB Ectopic Multiple Living   1    1          Review of Systems  All other systems reviewed and are negative.     Allergies  Other  Home Medications   Prior to Admission medications   Medication Sig Start Date End Date Taking? Authorizing Provider  cetirizine (ZYRTEC) 10 MG tablet Take 10 mg by mouth daily.    Yes Historical Provider, MD  diphenhydramine-acetaminophen  (TYLENOL PM) 25-500 MG TABS Take 2 tablets by mouth as needed (pain/sleep).    Yes Historical Provider, MD  HYDROcodone-acetaminophen (NORCO) 10-325 MG per tablet Take 2 tablets by mouth every 6 (six) hours as needed (pain).  11/08/14  Yes Historical Provider, MD  hydroxyurea (HYDREA) 500 MG capsule Take 1 capsule (500 mg total) by mouth 2 (two) times daily. May take with food to minimize GI side effects. Patient not taking: Reported on 11/24/2014 11/24/14   Elwyn Reach, MD  oxyCODONE-acetaminophen (PERCOCET) 5-325 MG per tablet Take 1 tablet by mouth every 6 (six) hours as needed. Patient not taking: Reported on 11/28/2014 11/24/14   Ernestina Patches, MD   BP 111/65 mmHg  Pulse 89  Temp(Src) 98.4 F (36.9 C) (Oral)  Resp 16  SpO2 96% Physical Exam  Constitutional: She is oriented to person, place, and time. She appears well-developed and well-nourished. No distress.  HENT:  Head: Normocephalic and atraumatic.  Eyes: EOM are normal.  Neck: Normal range of motion.  Cardiovascular: Normal rate, regular rhythm and normal heart sounds.   Pulmonary/Chest: Effort normal and breath sounds normal.  Abdominal: Soft. She exhibits no distension. There is no tenderness.  Musculoskeletal: Normal range of motion.  Neurological: She is alert  and oriented to person, place, and time.  Skin: Skin is warm and dry.  Psychiatric: She has a normal mood and affect. Judgment normal.  Nursing note and vitals reviewed.   ED Course  Procedures (including critical care time) Labs Review Labs Reviewed  CBC WITH DIFFERENTIAL/PLATELET - Abnormal; Notable for the following:    WBC 11.4 (*)    RBC 3.29 (*)    Hemoglobin 9.7 (*)    HCT 28.0 (*)    Platelets 633 (*)    Monocytes Absolute 1.2 (*)    All other components within normal limits  COMPREHENSIVE METABOLIC PANEL - Abnormal; Notable for the following:    Creatinine, Ser 0.49 (*)    Total Protein 8.6 (*)    AST 39 (*)    ALT 36 (*)    Total Bilirubin  1.4 (*)    All other components within normal limits  RETICULOCYTES - Abnormal; Notable for the following:    Retic Ct Pct 5.6 (*)    RBC. 3.29 (*)    All other components within normal limits  POC URINE PREG, ED    Imaging Review No results found.   EKG Interpretation None      MDM   Final diagnoses:  None    This sounds like sickle cell crisis related pain.  Doubt acute chest.  We'll attempt pain control this time.  4:47 PM Care to Dr Gabriel Earing, MD 02/12/15 [phone]

## 2015-02-12 NOTE — ED Notes (Signed)
Pt c/o sickle cell pain crises in shoulders and back.

## 2015-02-12 NOTE — ED Notes (Signed)
Pt states she has never received call from [facility] to set up initial appointment.

## 2015-02-15 ENCOUNTER — Encounter (HOSPITAL_COMMUNITY): Payer: Self-pay | Admitting: Emergency Medicine

## 2015-02-15 ENCOUNTER — Inpatient Hospital Stay (HOSPITAL_COMMUNITY)
Admission: EM | Admit: 2015-02-15 | Discharge: 2015-02-22 | DRG: 812 | Disposition: A | Discharge status: Home / Self Care | Payer: Medicaid Other | Attending: Internal Medicine | Admitting: Internal Medicine

## 2015-02-15 DIAGNOSIS — Z8249 Family history of ischemic heart disease and other diseases of the circulatory system: Secondary | ICD-10-CM

## 2015-02-15 DIAGNOSIS — E876 Hypokalemia: Secondary | ICD-10-CM | POA: Diagnosis present

## 2015-02-15 DIAGNOSIS — D5700 Hb-SS disease with crisis, unspecified: Principal | ICD-10-CM | POA: Diagnosis present

## 2015-02-15 DIAGNOSIS — Z79899 Other long term (current) drug therapy: Secondary | ICD-10-CM

## 2015-02-15 DIAGNOSIS — Z9049 Acquired absence of other specified parts of digestive tract: Secondary | ICD-10-CM | POA: Diagnosis present

## 2015-02-15 DIAGNOSIS — D473 Essential (hemorrhagic) thrombocythemia: Secondary | ICD-10-CM | POA: Diagnosis present

## 2015-02-15 DIAGNOSIS — Z832 Family history of diseases of the blood and blood-forming organs and certain disorders involving the immune mechanism: Secondary | ICD-10-CM

## 2015-02-15 DIAGNOSIS — D75839 Thrombocytosis, unspecified: Secondary | ICD-10-CM | POA: Diagnosis present

## 2015-02-15 DIAGNOSIS — Z9081 Acquired absence of spleen: Secondary | ICD-10-CM | POA: Diagnosis present

## 2015-02-15 LAB — COMPREHENSIVE METABOLIC PANEL
ALBUMIN: 4.5 g/dL (ref 3.5–5.2)
ALK PHOS: 62 U/L (ref 39–117)
ALT: 26 U/L (ref 0–35)
AST: 33 U/L (ref 0–37)
Anion gap: 10 (ref 5–15)
BUN: 8 mg/dL (ref 6–23)
CHLORIDE: 106 mmol/L (ref 96–112)
CO2: 23 mmol/L (ref 19–32)
Calcium: 9.3 mg/dL (ref 8.4–10.5)
Creatinine, Ser: 0.44 mg/dL — ABNORMAL LOW (ref 0.50–1.10)
GFR calc non Af Amer: >90 mL/min (ref >90)
GLUCOSE: 85 mg/dL (ref 70–99)
POTASSIUM: 3.3 mmol/L — AB (ref 3.5–5.1)
Sodium: 139 mmol/L (ref 135–145)
TOTAL PROTEIN: 8.2 g/dL (ref 6.0–8.3)
Total Bilirubin: 2 mg/dL — ABNORMAL HIGH (ref 0.3–1.2)

## 2015-02-15 LAB — CBC WITH DIFFERENTIAL/PLATELET
Basophils Absolute: 0 10*3/uL (ref 0.0–0.1)
Basophils Relative: 0 % (ref 0–1)
EOS PCT: 1 % (ref 0–5)
Eosinophils Absolute: 0.1 10*3/uL (ref 0.0–0.7)
HCT: 26.1 % — ABNORMAL LOW (ref 36.0–46.0)
HEMOGLOBIN: 9.1 g/dL — AB (ref 12.0–15.0)
Lymphocytes Relative: 39 % (ref 12–46)
Lymphs Abs: 3.9 10*3/uL (ref 0.7–4.0)
MCH: 29.8 pg (ref 26.0–34.0)
MCHC: 34.9 g/dL (ref 30.0–36.0)
MCV: 85.6 fL (ref 78.0–100.0)
MONO ABS: 1.2 10*3/uL — AB (ref 0.1–1.0)
MONOS PCT: 12 % (ref 3–12)
NEUTROS ABS: 4.8 10*3/uL (ref 1.7–7.7)
Neutrophils Relative %: 48 % (ref 43–77)
Platelets: 633 10*3/uL — ABNORMAL HIGH (ref 150–400)
RBC: 3.05 MIL/uL — ABNORMAL LOW (ref 3.87–5.11)
RDW: 15.9 % — AB (ref 11.5–15.5)
WBC: 10 10*3/uL (ref 4.0–10.5)

## 2015-02-15 LAB — RETICULOCYTES
RBC.: 3.05 MIL/uL — AB (ref 3.87–5.11)
Retic Count, Absolute: 280.6 10*3/uL — ABNORMAL HIGH (ref 19.0–186.0)
Retic Ct Pct: 9.2 % — ABNORMAL HIGH (ref 0.4–3.1)

## 2015-02-15 MED ORDER — HYDROMORPHONE HCL 1 MG/ML IJ SOLN: 1.0000 mg | Freq: Once | INTRAMUSCULAR | Status: DC

## 2015-02-15 MED ORDER — HYDROMORPHONE HCL 2 MG/ML IJ SOLN
2.0000 mg | INTRAMUSCULAR | Status: DC | PRN
  Administered 2015-02-15 – 2015-02-16 (×2): 2 mg via INTRAVENOUS
  Filled 2015-02-15 (×2): qty 1

## 2015-02-15 MED ORDER — SODIUM CHLORIDE 0.9 % IV BOLUS (SEPSIS)
1000.0000 mL | Freq: Once | INTRAVENOUS | Status: AC
  Administered 2015-02-15: 1000 mL via INTRAVENOUS

## 2015-02-15 MED ORDER — HYDROMORPHONE HCL 1 MG/ML IJ SOLN
1.0000 mg | INTRAMUSCULAR | Status: DC | PRN
  Administered 2015-02-15: 1 mg via INTRAVENOUS
  Filled 2015-02-15: qty 1

## 2015-02-15 MED ORDER — ONDANSETRON HCL 4 MG/2ML IJ SOLN
4.0000 mg | Freq: Once | INTRAMUSCULAR | Status: AC
Start: 2015-02-15 — End: 2015-02-15
  Administered 2015-02-15: 4 mg via INTRAVENOUS
  Filled 2015-02-15: qty 2

## 2015-02-15 NOTE — ED Provider Notes (Signed)
CSN: [id number]     Arrival date & time 02/15/15  2139 History   First MD Initiated Contact with Patient 02/15/15 2152     Chief Complaint  Patient presents with  . Sickle Cell Pain Crisis     (Consider location/radiation/quality/duration/timing/severity/associated sxs/prior Treatment) HPI   24 year old female with history of sickle cell disease who presents for evaluation of sickle cell related pain. Patient reports having bilateral shoulder pain and upper back pain which started 4 days ago. Pain is been persistent, but waxing and waning and felt similar to prior sickle cell related pain. She was seen in the ER 3 days ago for the same complaint at that time a workup was fairly unremarkable and her pain was controlled. Patient states that she has to go to an interview the next day so she left to go home from the ER but now her pain has returned. She denies having fever, chills, productive cough, hemoptysis, lightheadedness, dizziness, abdominal pain, nausea vomiting or diarrhea. She is currently on her menstrual period. Patient felt that changes in weather has worsened her pain. She has been taken her home medication with minimal relief.  Past Medical History  Diagnosis Date  . Sickle cell crisis   . Depression   . Headache(784.0)   . Abortion     05/2012   Past Surgical History  Procedure Laterality Date  . Splenectomy    . Cholecystectomy N/A 11/30/2014    Procedure: LAPAROSCOPIC CHOLECYSTECTOMY SINGLE SITE WITH INTRAOPERATIVE CHOLANGIOGRAM;  Surgeon: Michael Boston, MD;  Location: [facility];  Service: General;  Laterality: N/A;   Family History  Problem Relation Age of Onset  . Hypertension Mother   . Sickle cell anemia Sister   . Kidney disease Sister   . Arthritis Sister   . Sickle cell anemia Sister   . Sickle cell trait Sister   . Heart disease Maternal Aunt   . Heart disease Maternal Uncle    History  Substance Use Topics  . Smoking status: Never Smoker   . Smokeless  tobacco: Never Used  . Alcohol Use: No   OB History    Gravida Para Term Preterm AB TAB SAB Ectopic Multiple Living   1    1          Review of Systems  All other systems reviewed and are negative.     Allergies  Other  Home Medications   Prior to Admission medications   Medication Sig Start Date End Date Taking? Authorizing Provider  cetirizine (ZYRTEC) 10 MG tablet Take 10 mg by mouth daily.     Historical Provider, MD  diphenhydramine-acetaminophen (TYLENOL PM) 25-500 MG TABS Take 2 tablets by mouth as needed (pain/sleep).     Historical Provider, MD  HYDROcodone-acetaminophen (NORCO) 10-325 MG per tablet Take 2 tablets by mouth every 6 (six) hours as needed (pain).  11/08/14   Historical Provider, MD  hydroxyurea (HYDREA) 500 MG capsule Take 1 capsule (500 mg total) by mouth 2 (two) times daily. May take with food to minimize GI side effects. Patient not taking: Reported on 11/24/2014 11/24/14   Elwyn Reach, MD  oxyCODONE-acetaminophen (PERCOCET) 5-325 MG per tablet Take 1 tablet by mouth every 6 (six) hours as needed. Patient not taking: Reported on 11/28/2014 11/24/14   Ernestina Patches, MD   BP 119/78 mmHg  Pulse 84  Temp(Src) 98 F (36.7 C) (Oral)  Resp 18  Ht 5\' 3"  (1.6 m)  Wt 180 lb (81.647 kg)  BMI 31.89 kg/m2  SpO2 99%  LMP 02/13/2015 (Exact Date) Physical Exam  Constitutional: She appears well-developed and well-nourished. No distress.  HENT:  Head: Atraumatic.  Eyes: Conjunctivae are normal.  Neck: Neck supple.  Cardiovascular: Normal rate and regular rhythm.   Pulmonary/Chest: Effort normal and breath sounds normal.  Abdominal: Soft. There is no tenderness.  Musculoskeletal: She exhibits tenderness (Generalized tenderness throughout to bilateral shoulder and upper back without focal point tenderness.).  Neurological: She is alert.  Skin: No rash noted.  Psychiatric: She has a normal mood and affect.  Nursing note and vitals reviewed.   ED Course   Procedures (including critical care time)  Patient here with sickle cell related pain similar to past. No symptoms concerning for acute chest. Plan to pain control and monitor.  12:37 AM Pt continues to endorse pain despite receiving a total of 5mg  Dilaudid.  I have consulted with [facility] Dr. Posey Pronto who agrees to admit pt for further management of her pain.    Labs Review Labs Reviewed  CBC WITH DIFFERENTIAL/PLATELET - Abnormal; Notable for the following:    RBC 3.05 (*)    Hemoglobin 9.1 (*)    HCT 26.1 (*)    RDW 15.9 (*)    Platelets 633 (*)    Monocytes Absolute 1.2 (*)    All other components within normal limits  COMPREHENSIVE METABOLIC PANEL - Abnormal; Notable for the following:    Potassium 3.3 (*)    Creatinine, Ser 0.44 (*)    Total Bilirubin 2.0 (*)    All other components within normal limits  RETICULOCYTES - Abnormal; Notable for the following:    Retic Ct Pct 9.2 (*)    RBC. 3.05 (*)    Retic Count, Manual 280.6 (*)    All other components within normal limits    Imaging Review No results found.   EKG Interpretation None      MDM   Final diagnoses:  Sickle cell anemia with pain    BP 120/72 mmHg  Pulse 98  Temp(Src) 98 F (36.7 C) (Oral)  Resp 16  Ht 5\' 3"  (1.6 m)  Wt 180 lb (81.647 kg)  BMI 31.89 kg/m2  SpO2 100%  LMP 02/13/2015 (Exact Date)     Domenic Moras, PA-C 02/16/15 0041  Malvin Johns, MD 02/16/15 [phone]

## 2015-02-15 NOTE — ED Notes (Signed)
Pt arrived to the ED with a complaint of sickle cell crisis pain.  Pt was seen two days ago for same but has run out of her medication.  Pt states pain is located in her back both upper and lower portions.  Pain has been present for a day.

## 2015-02-16 DIAGNOSIS — D473 Essential (hemorrhagic) thrombocythemia: Secondary | ICD-10-CM | POA: Diagnosis present

## 2015-02-16 DIAGNOSIS — Z008 Encounter for other general examination: Secondary | ICD-10-CM | POA: Diagnosis not present

## 2015-02-16 DIAGNOSIS — D5700 Hb-SS disease with crisis, unspecified: Secondary | ICD-10-CM | POA: Diagnosis present

## 2015-02-16 DIAGNOSIS — Z9081 Acquired absence of spleen: Secondary | ICD-10-CM | POA: Diagnosis present

## 2015-02-16 DIAGNOSIS — Z8249 Family history of ischemic heart disease and other diseases of the circulatory system: Secondary | ICD-10-CM | POA: Diagnosis not present

## 2015-02-16 DIAGNOSIS — E876 Hypokalemia: Secondary | ICD-10-CM

## 2015-02-16 DIAGNOSIS — Z9049 Acquired absence of other specified parts of digestive tract: Secondary | ICD-10-CM | POA: Diagnosis present

## 2015-02-16 DIAGNOSIS — Z832 Family history of diseases of the blood and blood-forming organs and certain disorders involving the immune mechanism: Secondary | ICD-10-CM | POA: Diagnosis not present

## 2015-02-16 DIAGNOSIS — Z79899 Other long term (current) drug therapy: Secondary | ICD-10-CM | POA: Diagnosis not present

## 2015-02-16 LAB — COMPREHENSIVE METABOLIC PANEL
ALBUMIN: 3.8 g/dL (ref 3.5–5.2)
ALK PHOS: 52 U/L (ref 39–117)
ALT: 26 U/L (ref 0–35)
ANION GAP: 7 (ref 5–15)
AST: 36 U/L (ref 0–37)
BUN: 6 mg/dL (ref 6–23)
CHLORIDE: 109 mmol/L (ref 96–112)
CO2: 22 mmol/L (ref 19–32)
CREATININE: 0.42 mg/dL — AB (ref 0.50–1.10)
Calcium: 8.8 mg/dL (ref 8.4–10.5)
GFR calc Af Amer: >90 mL/min (ref >90)
GFR calc non Af Amer: >90 mL/min (ref >90)
Glucose, Bld: 103 mg/dL — ABNORMAL HIGH (ref 70–99)
Potassium: 3.6 mmol/L (ref 3.5–5.1)
Sodium: 138 mmol/L (ref 135–145)
TOTAL PROTEIN: 7.2 g/dL (ref 6.0–8.3)
Total Bilirubin: 2.3 mg/dL — ABNORMAL HIGH (ref 0.3–1.2)

## 2015-02-16 LAB — CBC WITH DIFFERENTIAL/PLATELET
BASOS ABS: 0.1 10*3/uL (ref 0.0–0.1)
Basophils Relative: 1 % (ref 0–1)
EOS ABS: 0.1 10*3/uL (ref 0.0–0.7)
Eosinophils Relative: 1 % (ref 0–5)
HCT: 22.4 % — ABNORMAL LOW (ref 36.0–46.0)
HEMOGLOBIN: 7.9 g/dL — AB (ref 12.0–15.0)
LYMPHS PCT: 44 % (ref 12–46)
Lymphs Abs: 5.8 10*3/uL — ABNORMAL HIGH (ref 0.7–4.0)
MCH: 29.8 pg (ref 26.0–34.0)
MCHC: 35.3 g/dL (ref 30.0–36.0)
MCV: 84.5 fL (ref 78.0–100.0)
MONO ABS: 1.5 10*3/uL — AB (ref 0.1–1.0)
Monocytes Relative: 12 % (ref 3–12)
NEUTROS PCT: 42 % — AB (ref 43–77)
Neutro Abs: 5.5 10*3/uL (ref 1.7–7.7)
Platelets: 584 10*3/uL — ABNORMAL HIGH (ref 150–400)
RBC: 2.65 MIL/uL — ABNORMAL LOW (ref 3.87–5.11)
RDW: 15.5 % (ref 11.5–15.5)
WBC: 13 10*3/uL — ABNORMAL HIGH (ref 4.0–10.5)

## 2015-02-16 LAB — MAGNESIUM: Magnesium: 1.9 mg/dL (ref 1.5–2.5)

## 2015-02-16 LAB — RETICULOCYTES
RBC.: 2.65 MIL/uL — ABNORMAL LOW (ref 3.87–5.11)
Retic Count, Absolute: 222.6 10*3/uL — ABNORMAL HIGH (ref 19.0–186.0)
Retic Ct Pct: 8.4 % — ABNORMAL HIGH (ref 0.4–3.1)

## 2015-02-16 LAB — LACTATE DEHYDROGENASE: LDH: 230 U/L (ref 94–250)

## 2015-02-16 MED ORDER — SODIUM CHLORIDE 0.45 % IV SOLN
INTRAVENOUS | Status: DC
  Administered 2015-02-16 – 2015-02-22 (×10): via INTRAVENOUS

## 2015-02-16 MED ORDER — HYDROMORPHONE 0.3 MG/ML IV SOLN
INTRAVENOUS | Status: DC
  Administered 2015-02-16: 2.1 mg via INTRAVENOUS
  Administered 2015-02-16: 0 mg via INTRAVENOUS
  Administered 2015-02-16: 02:00:00 via INTRAVENOUS
  Administered 2015-02-16: 1.8 mg via INTRAVENOUS
  Administered 2015-02-16: 1.14 mg via INTRAVENOUS
  Administered 2015-02-16: 1.9 mg via INTRAVENOUS
  Administered 2015-02-17: 1.7 mg via INTRAVENOUS
  Administered 2015-02-17: 1.2 mg via INTRAVENOUS
  Administered 2015-02-17: 2.88 mg via INTRAVENOUS
  Administered 2015-02-17: 15:00:00 via INTRAVENOUS
  Administered 2015-02-17: 1.5 mg via INTRAVENOUS
  Administered 2015-02-18 (×2): 2.1 mg via INTRAVENOUS
  Administered 2015-02-18: 0.539 mg via INTRAVENOUS
  Filled 2015-02-16 (×4): qty 25

## 2015-02-16 MED ORDER — NALOXONE HCL 0.4 MG/ML IJ SOLN: 0.4000 mg | INTRAMUSCULAR | Status: DC | PRN

## 2015-02-16 MED ORDER — ONDANSETRON HCL 4 MG/2ML IJ SOLN: 4.0000 mg | INTRAMUSCULAR | Status: DC | PRN

## 2015-02-16 MED ORDER — KETOROLAC TROMETHAMINE 30 MG/ML IJ SOLN
30.0000 mg | Freq: Four times a day (QID) | INTRAMUSCULAR | Status: AC
  Administered 2015-02-16 – 2015-02-20 (×20): 30 mg via INTRAVENOUS
  Filled 2015-02-16 (×23): qty 1

## 2015-02-16 MED ORDER — ENSURE ENLIVE PO LIQD
237.0000 mL | Freq: Two times a day (BID) | ORAL | Status: DC
  Administered 2015-02-16 – 2015-02-22 (×10): 237 mL via ORAL

## 2015-02-16 MED ORDER — POLYETHYLENE GLYCOL 3350 17 G PO PACK
17.0000 g | PACK | Freq: Every day | ORAL | Status: DC | PRN
  Administered 2015-02-19: 17 g via ORAL

## 2015-02-16 MED ORDER — FOLIC ACID 1 MG PO TABS
1.0000 mg | ORAL_TABLET | Freq: Every day | ORAL | Status: DC
  Administered 2015-02-16 – 2015-02-22 (×7): 1 mg via ORAL
  Filled 2015-02-16 (×7): qty 1

## 2015-02-16 MED ORDER — SENNOSIDES-DOCUSATE SODIUM 8.6-50 MG PO TABS
1.0000 | ORAL_TABLET | Freq: Two times a day (BID) | ORAL | Status: DC
  Administered 2015-02-16 – 2015-02-22 (×14): 1 via ORAL
  Filled 2015-02-16 (×18): qty 1

## 2015-02-16 MED ORDER — ONDANSETRON HCL 4 MG/2ML IJ SOLN
4.0000 mg | Freq: Once | INTRAMUSCULAR | Status: AC
  Administered 2015-02-16: 4 mg via INTRAVENOUS
  Filled 2015-02-16: qty 2

## 2015-02-16 MED ORDER — ONDANSETRON HCL 4 MG/2ML IJ SOLN
4.0000 mg | Freq: Four times a day (QID) | INTRAMUSCULAR | Status: DC | PRN
  Administered 2015-02-16 (×3): 4 mg via INTRAVENOUS
  Filled 2015-02-16 (×3): qty 2

## 2015-02-16 MED ORDER — DIPHENHYDRAMINE HCL 25 MG PO CAPS
25.0000 mg | ORAL_CAPSULE | Freq: Four times a day (QID) | ORAL | Status: DC | PRN
Start: 2015-02-16 — End: 2015-02-19
  Administered 2015-02-17: 25 mg via ORAL
  Filled 2015-02-16: qty 1

## 2015-02-16 MED ORDER — SODIUM CHLORIDE 0.9 % IJ SOLN: 9.0000 mL | INTRAMUSCULAR | Status: DC | PRN

## 2015-02-16 MED ORDER — ENOXAPARIN SODIUM 40 MG/0.4ML ~~LOC~~ SOLN
40.0000 mg | SUBCUTANEOUS | Status: DC
  Administered 2015-02-16 – 2015-02-22 (×6): 40 mg via SUBCUTANEOUS
  Filled 2015-02-16 (×7): qty 0.4

## 2015-02-16 MED ORDER — ONDANSETRON HCL 4 MG PO TABS
4.0000 mg | ORAL_TABLET | ORAL | Status: DC | PRN
  Administered 2015-02-17 – 2015-02-18 (×3): 4 mg via ORAL
  Filled 2015-02-16 (×3): qty 1

## 2015-02-16 NOTE — H&P (Signed)
[facility] History and Physical  Patient: Carmen [name]  MRN: [id number]  DOB: 23-Dec-1990  DOS: the patient was seen and examined on 02/16/2015 PCP: [name], MD  Chief Complaint:  Back pain  HPI: Carmen [name] is a 24 y.o. female with Past medical history of sickle cell pain, depression. The patient is presenting with complaints of back pain. The pain is typical of her sickle cell pain. The pain has been ongoing for last 1 week progressively worsening. She was earlier seen in the ER on 02/12/2015 and was treated in the ER with pain medication and was sent home. She tried to take her pain medication but her pain was not improving and therefore she came back to the ER on 02/15/2015. Patient denies any complaints of fall, trauma, injury, headache, cough, chest pain, abdominal pain, nausea, vomiting, diarrhea, burning urination. She is not taking fully cancer hydroxyurea as she mentions she is not prescribed. She denies any focal deficit.  The patient is coming from home And at her baseline independent for most of her ADL.  Review of Systems: as mentioned in the history of present illness.  A comprehensive review of the other systems is negative.  Past Medical History  Diagnosis Date  . Sickle cell crisis   . Depression   . Headache(784.0)   . Abortion     05/2012   Past Surgical History  Procedure Laterality Date  . Splenectomy    . Cholecystectomy N/A 11/30/2014    Procedure: LAPAROSCOPIC CHOLECYSTECTOMY SINGLE SITE WITH INTRAOPERATIVE CHOLANGIOGRAM;  Surgeon: Michael Boston, MD;  Location: [facility];  Service: General;  Laterality: N/A;   Social History:  reports that she has never smoked. She has never used smokeless tobacco. She reports that she does not drink alcohol or use illicit drugs.  Allergies  Allergen Reactions  . Other Nausea Only    Patient stated that all iv pain medication make her have really bad nausea    Family History  Problem Relation Age  of Onset  . Hypertension Mother   . Sickle cell anemia Sister   . Kidney disease Sister   . Arthritis Sister   . Sickle cell anemia Sister   . Sickle cell trait Sister   . Heart disease Maternal Aunt   . Heart disease Maternal Uncle     Prior to Admission medications   Medication Sig Start Date End Date Taking? Authorizing Provider  cetirizine (ZYRTEC) 10 MG tablet Take 10 mg by mouth daily.    Yes Historical Provider, MD  diphenhydramine-acetaminophen (TYLENOL PM) 25-500 MG TABS Take 2 tablets by mouth as needed (pain/sleep).    Yes Historical Provider, MD  HYDROcodone-acetaminophen (NORCO) 10-325 MG per tablet Take 2 tablets by mouth every 6 (six) hours as needed (pain).  11/08/14  Yes Historical Provider, MD  hydroxyurea (HYDREA) 500 MG capsule Take 1 capsule (500 mg total) by mouth 2 (two) times daily. May take with food to minimize GI side effects. Patient not taking: Reported on 11/24/2014 11/24/14   Elwyn Reach, MD  oxyCODONE-acetaminophen (PERCOCET) 5-325 MG per tablet Take 1 tablet by mouth every 6 (six) hours as needed. Patient not taking: Reported on 11/28/2014 11/24/14   Ernestina Patches, MD    Physical Exam: Filed Vitals:   02/16/15 0022 02/16/15 0126 02/16/15 0146 02/16/15 0349  BP: 120/72 123/68    Pulse: 98 90    Temp:  98 F (36.7 C)    TempSrc:  Oral    Resp: 16  16 13 14   Height:  5\' 3"  (1.6 m)    Weight:  81.829 kg (180 lb 6.4 oz)    SpO2: 100% 100% 100% 96%    General: Alert, Awake and Oriented to Time, Place and Person. Appear in mild distress Eyes: PERRL ENT: Oral Mucosa clear moist. Neck: no JVD Cardiovascular: S1 and S2 Present, no Murmur, Peripheral Pulses Present Respiratory: Bilateral Air entry equal and Decreased,  Clear to Auscultation, noCrackles, no wheezes Abdomen: Bowel Sound presen, Soft and non tender Skin: no Rash Extremities: no Pedal edema, no calf tenderness Neurologic: Grossly no focal neuro deficit.  Labs on Admission:   CBC:  Recent Labs Lab 02/12/15 1514 02/15/15 2241  WBC 11.4* 10.0  NEUTROABS 6.4 4.8  HGB 9.7* 9.1*  HCT 28.0* 26.1*  MCV 85.1 85.6  PLT 633* 633*    CMP     Component Value Date/Time   NA 139 02/15/2015 2241   K 3.3* 02/15/2015 2241   CL 106 02/15/2015 2241   CO2 23 02/15/2015 2241   GLUCOSE 85 02/15/2015 2241   BUN 8 02/15/2015 2241   CREATININE 0.44* 02/15/2015 2241   CALCIUM 9.3 02/15/2015 2241   PROT 8.2 02/15/2015 2241   ALBUMIN 4.5 02/15/2015 2241   AST 33 02/15/2015 2241   ALT 26 02/15/2015 2241   ALKPHOS 62 02/15/2015 2241   BILITOT 2.0* 02/15/2015 2241   GFRNONAA >90 02/15/2015 2241   GFRAA >90 02/15/2015 2241    No results for input(s): LIPASE, AMYLASE in the last 168 hours.  No results for input(s): CKTOTAL, CKMB, CKMBINDEX, TROPONINI in the last 168 hours. BNP (last 3 results) No results for input(s): BNP in the last 8760 hours.  ProBNP (last 3 results) No results for input(s): PROBNP in the last 8760 hours.   Radiological Exams on Admission: No results found.  Assessment/Plan Principal Problem:   Sickle cell anemia with pain Active Problems:   Thrombocytosis   Hypokalemia   1. Sickle cell anemia with pain The patient is presenting with complaints of sickle cell crisis. The pain she is describing is typical of her sickle cell pain. With this she will be admitted in the hospital. She will be placed on her routine PCA dose for crisis prescribed a sickle cell services. The patient will also be given IV hydration. I would continue her on Foley acid. According to place her on hydroxyurea unless she has an appropriate follow-up.  2. Hypokalemia. Replacing orally and rechecking.  3. Possible sleep apnea. Patient has been notified of highly possible sleep apnea. She is concerned about it at present. Recommended that she will be monitored with pulse oximetry while she is on PCA. Recommended her to follow up with her PCP as an outpatient to  obtain an order for sleep study.  DVT Prophylaxis: subcutaneous Heparin Nutrition: regular diet  Family Communication: family was present at bedside, opportunity was given to ask question and all questions were answered satisfactorily at the time of interview. Disposition: Admitted as inpatient, med-surge unit.  Author: Berle Mull, MD [facility] Pager: [phone] 02/16/2015  If 7PM-7AM, please contact night-coverage [email or web address] Password [email or web address]

## 2015-02-16 NOTE — Progress Notes (Signed)
CARE MANAGEMENT NOTE 02/16/2015  Patient:  LAMA, NARAYANAN   Account Number:  [id number]  Date Initiated:  02/16/2015  Documentation initiated by:  Joshuwa Vecchio  Subjective/Objective Assessment:   scc with failed outpt treatment to control     Action/Plan:   home   Anticipated DC Date:  02/19/2015   Anticipated DC Plan:  HOME/SELF CARE  In-house referral  NA      DC Planning Services  NA      [facility] Choice  NA   Choice offered to / List presented to:  NA      DME agency  NA        [place] agency  NA   Status of service:  In process, will continue to follow Medicare Important Message given?   (If response is "NO", the following Medicare IM given date fields will be blank) Date Medicare IM given:   Medicare IM given by:   Date Additional Medicare IM given:   Additional Medicare IM given by:    Discharge Disposition:    Per UR Regulation:  Reviewed for med. necessity/level of care/duration of stay  If discussed at [place] of Stay Meetings, dates discussed:    Comments:  February 16, 2015/Natacha Jepsen L. Rosana Hoes, RN, BSN, CCM. Case Management [place] [phone] No discharge needs present of time of review.

## 2015-02-16 NOTE — Care Management Note (Signed)
    Page 1 of 2   02/16/2015     4:27:33 PM CARE MANAGEMENT NOTE 02/16/2015  Patient:  Carmen [name], Carmen [name]   Account Number:  [id number]  Date Initiated:  02/16/2015  Documentation initiated by:  DAVIS,RHONDA  Subjective/Objective Assessment:   scc with failed outpt treatment to control     Action/Plan:   home   Anticipated DC Date:  02/19/2015   Anticipated DC Plan:  HOME/SELF CARE  In-house referral  NA      DC Planning Services  NA      [facility] Choice  NA   Choice offered to / List presented to:  NA      DME agency  NA        [place] agency  NA   Status of service:  In process, will continue to follow Medicare Important Message given?   (If response is "NO", the following Medicare IM given date fields will be blank) Date Medicare IM given:   Medicare IM given by:   Date Additional Medicare IM given:   Additional Medicare IM given by:    Discharge Disposition:    Per UR Regulation:  Reviewed for med. necessity/level of care/duration of stay  If discussed at [place] of Stay Meetings, dates discussed:    Comments:  02/16/15 Dessa Phi /rn BSN NCM [phone] Patient has already started process of filling out referral form for [facility], has a case worker-Marguerite following.She is  awaiting approval.  February 16, 2015/Rhonda L. Rosana Hoes, RN, BSN, CCM. Case Management [place] [phone] No discharge needs present of time of review.

## 2015-02-17 LAB — CBC WITH DIFFERENTIAL/PLATELET
BASOS ABS: 0 10*3/uL (ref 0.0–0.1)
BASOS PCT: 0 % (ref 0–1)
EOS ABS: 0.2 10*3/uL (ref 0.0–0.7)
Eosinophils Relative: 2 % (ref 0–5)
HEMATOCRIT: 23.3 % — AB (ref 36.0–46.0)
HEMOGLOBIN: 8.2 g/dL — AB (ref 12.0–15.0)
Lymphocytes Relative: 47 % — ABNORMAL HIGH (ref 12–46)
Lymphs Abs: 5.6 10*3/uL — ABNORMAL HIGH (ref 0.7–4.0)
MCH: 29.4 pg (ref 26.0–34.0)
MCHC: 35.2 g/dL (ref 30.0–36.0)
MCV: 83.5 fL (ref 78.0–100.0)
MONO ABS: 1.5 10*3/uL — AB (ref 0.1–1.0)
MONOS PCT: 13 % — AB (ref 3–12)
NEUTROS ABS: 4.5 10*3/uL (ref 1.7–7.7)
Neutrophils Relative %: 38 % — ABNORMAL LOW (ref 43–77)
Platelets: 593 10*3/uL — ABNORMAL HIGH (ref 150–400)
RBC: 2.79 MIL/uL — ABNORMAL LOW (ref 3.87–5.11)
RDW: 16.1 % — AB (ref 11.5–15.5)
WBC: 11.8 10*3/uL — ABNORMAL HIGH (ref 4.0–10.5)

## 2015-02-17 LAB — COMPREHENSIVE METABOLIC PANEL
ALBUMIN: 3.8 g/dL (ref 3.5–5.2)
ALK PHOS: 55 U/L (ref 39–117)
ALT: 25 U/L (ref 0–35)
ANION GAP: 7 (ref 5–15)
AST: 27 U/L (ref 0–37)
BILIRUBIN TOTAL: 1.8 mg/dL — AB (ref 0.3–1.2)
BUN: 6 mg/dL (ref 6–23)
CHLORIDE: 103 mmol/L (ref 96–112)
CO2: 25 mmol/L (ref 19–32)
Calcium: 9 mg/dL (ref 8.4–10.5)
Creatinine, Ser: 0.45 mg/dL — ABNORMAL LOW (ref 0.50–1.10)
GFR calc Af Amer: >90 mL/min (ref >90)
Glucose, Bld: 110 mg/dL — ABNORMAL HIGH (ref 70–99)
Potassium: 3.6 mmol/L (ref 3.5–5.1)
Sodium: 135 mmol/L (ref 135–145)
Total Protein: 7.2 g/dL (ref 6.0–8.3)

## 2015-02-17 NOTE — Progress Notes (Signed)
INITIAL NUTRITION ASSESSMENT  DOCUMENTATION CODES Per approved criteria  -Obesity Unspecified   INTERVENTION: Continue Ensure Enlive po BID, each supplement provides 350 kcal and 20 grams of protein Encourage PO intake RD to continue to monitor PO intake  NUTRITION DIAGNOSIS: Inadequate oral intake related to sickle cell pain as evidenced by poor PO intake <25% x 1 week.   Goal: Pt to meet >/= 90% of their estimated nutrition needs   Monitor:  PO and supplemental intake, weight, labs, I/O's  Reason for Assessment: Pt identified as at nutrition risk on the Malnutrition Screen Tool  Admitting Dx: Sickle cell anemia with pain  ASSESSMENT: 24 y.o. female with Past medical history of sickle cell pain, depression. The patient is presenting with complaints of back pain. The pain is typical of her sickle cell pain.  Pt in room with visitor at bedside. Pt reports feeling pain and not having much of an appetite. Pt with snacks and liquids on tray without being touched. Pt states she has not been eating x 1 week d/t pain and N/V.   Pt has lost 18 lb since 05/16/14 (9% weight loss x 9 months, insignificant for time frame).  Pt has been ordered Ensure supplements BID, pt states she is drinking them. Encouraged pt to try and sip on supplement throughout the day.  Unable to perform nutrition focused physical exam as pt in bed in pain at this time.  Labs reviewed: Low Creatinine  Height: Ht Readings from Last 1 Encounters:  02/16/15 5\' 3"  (1.6 m)    Weight: Wt Readings from Last 1 Encounters:  02/16/15 180 lb 6.4 oz (81.829 kg)    Ideal Body Weight: 115 lb  % Ideal Body Weight: 157%  Wt Readings from Last 10 Encounters:  02/16/15 180 lb 6.4 oz (81.829 kg)  05/16/14 198 lb 6.6 oz (90 kg)  03/28/14 186 lb 12.8 oz (84.732 kg)  01/29/14 135 lb (61.236 kg)  11/12/13 183 lb 9.6 oz (83.28 kg)  11/10/13 170 lb (77.111 kg)  11/02/13 160 lb (72.576 kg)  09/03/13 170 lb (77.111 kg)   06/12/13 195 lb (88.451 kg)  06/10/13 195 lb (88.451 kg)    Usual Body Weight: 195 lb  % Usual Body Weight: 95%  BMI:  Body mass index is 31.96 kg/(m^2).  Estimated Nutritional Needs: Kcal: 1500-1700 Protein: 60-70g Fluid: 1.5L/day  Skin: intact  Diet Order: Diet regular Room service appropriate?: Yes  EDUCATION NEEDS: -No education needs identified at this time   Intake/Output Summary (Last 24 hours) at 02/17/15 1041 Last data filed at 02/17/15 0325  Gross per 24 hour  Intake    600 ml  Output   1700 ml  Net  -1100 ml    Last BM: 4/16  Labs:   Recent Labs Lab 02/15/15 2241 02/16/15 0437 02/17/15 0445  NA 139 138 135  K 3.3* 3.6 3.6  CL 106 109 103  CO2 23 22 25   BUN 8 6 6   CREATININE 0.44* 0.42* 0.45*  CALCIUM 9.3 8.8 9.0  MG  --  1.9  --   GLUCOSE 85 103* 110*    CBG (last 3)  No results for input(s): GLUCAP in the last 72 hours.  Scheduled Meds: . enoxaparin (LOVENOX) injection  40 mg Subcutaneous Q24H  . feeding supplement (ENSURE ENLIVE)  237 mL Oral BID BM  . folic acid  1 mg Oral Daily  . HYDROmorphone PCA 0.3 mg/mL   Intravenous 6 times per day  . ketorolac  30 mg Intravenous 4 times per day  . senna-docusate  1 tablet Oral BID    Continuous Infusions: . sodium chloride 75 mL/hr at 02/17/15 0334    Past Medical History  Diagnosis Date  . Sickle cell crisis   . Depression   . Headache(784.0)   . Abortion     05/2012    Past Surgical History  Procedure Laterality Date  . Splenectomy    . Cholecystectomy N/A 11/30/2014    Procedure: LAPAROSCOPIC CHOLECYSTECTOMY SINGLE SITE WITH INTRAOPERATIVE CHOLANGIOGRAM;  Surgeon: Michael Boston, MD;  Location: [facility];  Service: General;  Laterality: N/A;    Clayton Bibles, MS, RD, LDN Pager: [phone] After Hours Pager: [phone]

## 2015-02-17 NOTE — Progress Notes (Signed)
Subjective: A 24 year old female admitted with sickle cell painful crisis. Patient is relatively narcotics tolerant. She has been having pain at 7 out of 10 and has been on regular PCA dosing. Her pain is currently at 7 out of 10. She has not been using the Dilaudid as she she'll. She's only used 8 mg in the last 24 hours was 12 demands and 11 deliveries. She has also been on Toradol. Patient has no shortness of breath no cough no fever no nausea vomiting or diarrhea.  Objective: Vital signs in last 24 hours: Temp:  [98.1 F (36.7 C)-98.5 F (36.9 C)] 98.4 F (36.9 C) (04/18 1430) Pulse Rate:  [63-100] 75 (04/18 1430) Resp:  [10-21] 18 (04/18 1430) BP: (113-123)/(71-80) 123/77 mmHg (04/18 1430) SpO2:  [95 %-100 %] 99 % (04/18 1510) Weight change:  Last BM Date: 02/15/15  Intake/Output from previous day: 04/17 0701 - 04/18 0700 In: 600 [I.V.:600] Out: 2500 [Urine:2500] Intake/Output this shift: Total I/O In: 240 [P.O.:240] Out: -   General appearance: alert, cooperative, appears stated age and no distress Head: Normocephalic, without obvious abnormality, atraumatic Eyes: conjunctivae/corneas clear. PERRL, EOM's intact. Fundi benign. Throat: lips, mucosa, and tongue normal; teeth and gums normal Neck: no adenopathy, no carotid bruit, no JVD, supple, symmetrical, trachea midline and thyroid not enlarged, symmetric, no tenderness/mass/nodules Back: symmetric, no curvature. ROM normal. No CVA tenderness. Resp: clear to auscultation bilaterally Chest wall: no tenderness Cardio: regular rate and rhythm, S1, S2 normal, no murmur, click, rub or gallop GI: soft, non-tender; bowel sounds normal; no masses,  no organomegaly Extremities: extremities normal, atraumatic, no cyanosis or edema Pulses: 2+ and symmetric Skin: Skin color, texture, turgor normal. No rashes or lesions Neurologic: Grossly normal  Lab Results:  Recent Labs  02/16/15 0437 02/17/15 0445  WBC 13.0* 11.8*  HGB  7.9* 8.2*  HCT 22.4* 23.3*  PLT 584* 593*   BMET  Recent Labs  02/16/15 0437 02/17/15 0445  NA 138 135  K 3.6 3.6  CL 109 103  CO2 22 25  GLUCOSE 103* 110*  BUN 6 6  CREATININE 0.42* 0.45*  CALCIUM 8.8 9.0    Studies/Results: No results found.  Medications: I have reviewed the patient's current medications.  Assessment/Plan: A 24 year old female admitted with sickle cell painful crisis.  #1 sickle cell painful crisis: Patient on Dilaudid PCA and Toradol. She has not been using the PCA as prescribed. I've discussed with her the need for her to use it for so we can see if we need to make any changes. She is moderately tolerant to the narcotics and should be able to use more. She's been able to move around and walk according to her.  #2 sickle cell anemia: Hemoglobin has remained stable at his baseline. No further evidence of hemolysis.  #3 leukocytosis: Most likely due to her sickle cell vaso-occlusive crisis. Continue to monitor.  #4 hypokalemia: This has been repleted.  #5 possible obstructive sleep apnea: Based on patient's history. She will need to follow-up with her primary care physician for outpatient sleep study.  LOS: 1 day   Trenee Igoe,LAWAL 02/17/2015, 5:06 PM

## 2015-02-18 LAB — CBC WITH DIFFERENTIAL/PLATELET
Basophils Absolute: 0.1 10*3/uL (ref 0.0–0.1)
Basophils Relative: 1 % (ref 0–1)
Eosinophils Absolute: 0.4 10*3/uL (ref 0.0–0.7)
Eosinophils Relative: 3 % (ref 0–5)
HEMATOCRIT: 24 % — AB (ref 36.0–46.0)
Hemoglobin: 8.4 g/dL — ABNORMAL LOW (ref 12.0–15.0)
LYMPHS PCT: 41 % (ref 12–46)
Lymphs Abs: 5.3 10*3/uL — ABNORMAL HIGH (ref 0.7–4.0)
MCH: 29.6 pg (ref 26.0–34.0)
MCHC: 35 g/dL (ref 30.0–36.0)
MCV: 84.5 fL (ref 78.0–100.0)
Monocytes Absolute: 1.5 10*3/uL — ABNORMAL HIGH (ref 0.1–1.0)
Monocytes Relative: 12 % (ref 3–12)
NEUTROS ABS: 5.6 10*3/uL (ref 1.7–7.7)
Neutrophils Relative %: 44 % (ref 43–77)
PLATELETS: 602 10*3/uL — AB (ref 150–400)
RBC: 2.84 MIL/uL — AB (ref 3.87–5.11)
RDW: 16.2 % — ABNORMAL HIGH (ref 11.5–15.5)
WBC: 12.8 10*3/uL — AB (ref 4.0–10.5)

## 2015-02-18 LAB — COMPREHENSIVE METABOLIC PANEL
ALBUMIN: 4 g/dL (ref 3.5–5.2)
ALK PHOS: 52 U/L (ref 39–117)
ALT: 24 U/L (ref 0–35)
AST: 28 U/L (ref 0–37)
Anion gap: 8 (ref 5–15)
BUN: 6 mg/dL (ref 6–23)
CO2: 24 mmol/L (ref 19–32)
Calcium: 9.2 mg/dL (ref 8.4–10.5)
Chloride: 107 mmol/L (ref 96–112)
Creatinine, Ser: 0.37 mg/dL — ABNORMAL LOW (ref 0.50–1.10)
GFR calc Af Amer: >90 mL/min (ref >90)
GFR calc non Af Amer: >90 mL/min (ref >90)
Glucose, Bld: 89 mg/dL (ref 70–99)
Potassium: 3.9 mmol/L (ref 3.5–5.1)
Sodium: 139 mmol/L (ref 135–145)
TOTAL PROTEIN: 7.3 g/dL (ref 6.0–8.3)
Total Bilirubin: 1.8 mg/dL — ABNORMAL HIGH (ref 0.3–1.2)

## 2015-02-18 MED ORDER — OXYCODONE HCL 5 MG PO TABS
5.0000 mg | ORAL_TABLET | Freq: Once | ORAL | Status: AC
Start: 2015-02-18 — End: 2015-02-18
  Administered 2015-02-18: 5 mg via ORAL
  Filled 2015-02-18: qty 1

## 2015-02-18 MED ORDER — HYDROMORPHONE 0.3 MG/ML IV SOLN
INTRAVENOUS | Status: DC
  Administered 2015-02-18: 1.99 mg via INTRAVENOUS
  Administered 2015-02-18: 2.44 mg via INTRAVENOUS
  Administered 2015-02-19: 3.15 mg via INTRAVENOUS
  Administered 2015-02-19: 2.49 mg via INTRAVENOUS
  Administered 2015-02-19: 1.49 mg via INTRAVENOUS
  Administered 2015-02-19: 04:00:00 via INTRAVENOUS
  Administered 2015-02-19: 1.99 mg via INTRAVENOUS
  Filled 2015-02-18 (×2): qty 25

## 2015-02-18 NOTE — Progress Notes (Signed)
Subjective: Patient has felt much better today. She is still on the Dilaudid PCA but is mildly narcotic tolerant. She has used 13 mg of the Dilaudid in the last 24 hours with 25 demands and 24 deliveries. She is not on any long-acting at home. Pain is a 7 out of 10 now and how goal is to get to 5 out of 10. No fever no chills no nausea vomiting or diarrhea. Patient reportedly fell last night but was able to get up without any problem. She insisted that she tripped over the wires when she fell and did not feel dizzy.  Objective: Vital signs in last 24 hours: Temp:  [97.4 F (36.3 C)-99.5 F (37.5 C)] 98.6 F (37 C) (04/19 1348) Pulse Rate:  [72-96] 96 (04/19 1348) Resp:  [8-19] 18 (04/19 1348) BP: (111-136)/(68-84) 121/75 mmHg (04/19 1348) SpO2:  [92 %-100 %] 94 % (04/19 1549) Weight:  [88.361 kg (194 lb 12.8 oz)] 88.361 kg (194 lb 12.8 oz) (04/19 0706) Weight change:  Last BM Date: 02/15/15  Intake/Output from previous day: 04/18 0701 - 04/19 0700 In: 240 [P.O.:240] Out: -  Intake/Output this shift: Total I/O In: 240 [P.O.:240] Out: 1300 [Urine:1300]  General appearance: alert, cooperative, appears stated age and no distress Head: Normocephalic, without obvious abnormality, atraumatic Eyes: conjunctivae/corneas clear. PERRL, EOM's intact. Fundi benign. Throat: lips, mucosa, and tongue normal; teeth and gums normal Neck: no adenopathy, no carotid bruit, no JVD, supple, symmetrical, trachea midline and thyroid not enlarged, symmetric, no tenderness/mass/nodules Back: symmetric, no curvature. ROM normal. No CVA tenderness. Resp: clear to auscultation bilaterally Chest wall: no tenderness Cardio: regular rate and rhythm, S1, S2 normal, no murmur, click, rub or gallop GI: soft, non-tender; bowel sounds normal; no masses,  no organomegaly Extremities: extremities normal, atraumatic, no cyanosis or edema Pulses: 2+ and symmetric Skin: Skin color, texture, turgor normal. No rashes or  lesions Neurologic: Grossly normal  Lab Results:  Recent Labs  02/17/15 0445 02/18/15 0434  WBC 11.8* 12.8*  HGB 8.2* 8.4*  HCT 23.3* 24.0*  PLT 593* 602*   BMET  Recent Labs  02/17/15 0445 02/18/15 0434  NA 135 139  K 3.6 3.9  CL 103 107  CO2 25 24  GLUCOSE 110* 89  BUN 6 6  CREATININE 0.45* 0.37*  CALCIUM 9.0 9.2    Studies/Results: No results found.  Medications: I have reviewed the patient's current medications.  Assessment/Plan: A 24 year old female admitted with sickle cell painful crisis.  #1 sickle cell painful crisis: Patient will be maintain on Dilaudid PCA today. She is on the lowest dose and is therefore not progressing well. I will increase her bolus dose from 0.3-0.5 mg for the next 24 hours. When her pain is down to 5 out of 10 she can be weaned of the PCA and continue with her orals.  #2 sickle cell anemia: Hemoglobin has remained stable at her baseline. Continue to monitor  #3 leukocytosis: Due to her sickle cell vaso-occlusive crisis. Continue to monitor.  #4 hypokalemia: This has been repleted.  #5 possible obstructive sleep apnea: Based on patient's history. She will need to follow-up with her primary care physician for outpatient sleep study.  LOS: 2 days   Sherry Blackard,LAWAL 02/18/2015, 5:36 PM

## 2015-02-18 NOTE — Progress Notes (Signed)
Explained several times to pt  due to fall she was now a high fall risk and it was our policy to have an alarm on bed and chair. Pt refused. Will continue to monitor.

## 2015-02-18 NOTE — Progress Notes (Signed)
Pt sister came to nursing station tearful, "my sister fell." Entered pt room, pt sitting in recliner. Pt state she was fine, no pain other than regular sickle pain. Pt state she felt a little dizzy and did not have on socks. She was trying to ambulate to bathroom and fell to her knees. Pt IV came out. Pt and her sister cleaned up blood. New IV was started and IVF and PCA restarted. On call provider notified. Will continue to monitor.

## 2015-02-19 MED ORDER — HYDROMORPHONE 0.3 MG/ML IV SOLN
INTRAVENOUS | Status: DC
  Administered 2015-02-19 (×2): via INTRAVENOUS
  Administered 2015-02-19: 5.69 mg via INTRAVENOUS
  Administered 2015-02-20: 07:00:00 via INTRAVENOUS
  Administered 2015-02-20: 5.1 mg via INTRAVENOUS
  Administered 2015-02-20: 1.35 mg via INTRAVENOUS
  Administered 2015-02-20: 6.78 mg via INTRAVENOUS
  Filled 2015-02-19 (×3): qty 25

## 2015-02-19 MED ORDER — SODIUM CHLORIDE 0.9 % IJ SOLN: 9.0000 mL | INTRAMUSCULAR | Status: DC | PRN

## 2015-02-19 MED ORDER — HYDROXYUREA 500 MG PO CAPS: 500.0000 mg | ORAL_CAPSULE | Freq: Every day | ORAL | Status: DC

## 2015-02-19 MED ORDER — ASPIRIN 81 MG PO TBEC: 81.0000 mg | DELAYED_RELEASE_TABLET | Freq: Every day | ORAL | Status: DC

## 2015-02-19 MED ORDER — DIPHENHYDRAMINE HCL 25 MG PO CAPS
25.0000 mg | ORAL_CAPSULE | Freq: Four times a day (QID) | ORAL | Status: DC | PRN
  Administered 2015-02-20: 50 mg via ORAL
  Administered 2015-02-20: 25 mg via ORAL
  Administered 2015-02-21 – 2015-02-22 (×4): 50 mg via ORAL
  Filled 2015-02-19 (×3): qty 2
  Filled 2015-02-19: qty 1
  Filled 2015-02-19 (×2): qty 2

## 2015-02-19 MED ORDER — LIP MEDEX EX OINT
TOPICAL_OINTMENT | CUTANEOUS | Status: AC
  Administered 2015-02-19: 21:00:00
  Filled 2015-02-19: qty 7

## 2015-02-19 MED ORDER — HYDROXYUREA 500 MG PO CAPS
500.0000 mg | ORAL_CAPSULE | Freq: Every day | ORAL | Status: DC
  Administered 2015-02-19 – 2015-02-22 (×4): 500 mg via ORAL
  Filled 2015-02-19 (×4): qty 1

## 2015-02-19 MED ORDER — NALOXONE HCL 0.4 MG/ML IJ SOLN: 0.4000 mg | INTRAMUSCULAR | Status: DC | PRN

## 2015-02-19 MED ORDER — ASPIRIN EC 81 MG PO TBEC
81.0000 mg | DELAYED_RELEASE_TABLET | Freq: Every day | ORAL | Status: DC
  Administered 2015-02-19 – 2015-02-22 (×4): 81 mg via ORAL
  Filled 2015-02-19 (×5): qty 1

## 2015-02-19 MED ORDER — HYDROMORPHONE 0.3 MG/ML IV SOLN: INTRAVENOUS | Status: DC

## 2015-02-19 MED ORDER — FOLIC ACID 1 MG PO TABS: 1.0000 mg | ORAL_TABLET | Freq: Every day | ORAL | Status: DC

## 2015-02-19 MED ORDER — HYDROMORPHONE HCL 2 MG/ML IJ SOLN
2.0000 mg | Freq: Once | INTRAMUSCULAR | Status: AC
  Administered 2015-02-19: 2 mg via INTRAVENOUS
  Filled 2015-02-19: qty 1

## 2015-02-19 MED ORDER — POLYETHYLENE GLYCOL 3350 17 G PO PACK
17.0000 g | PACK | Freq: Every day | ORAL | Status: DC
  Administered 2015-02-20 – 2015-02-22 (×3): 17 g via ORAL
  Filled 2015-02-19 (×3): qty 1

## 2015-02-19 MED ORDER — ONDANSETRON HCL 4 MG/2ML IJ SOLN
4.0000 mg | Freq: Four times a day (QID) | INTRAMUSCULAR | Status: DC | PRN
  Administered 2015-02-19 – 2015-02-22 (×7): 4 mg via INTRAVENOUS
  Filled 2015-02-19 (×6): qty 2

## 2015-02-19 MED ORDER — SODIUM CHLORIDE 0.9 % IV SOLN
25.0000 mg | INTRAVENOUS | Status: DC | PRN
  Administered 2015-02-20: 25 mg via INTRAVENOUS
  Filled 2015-02-19 (×3): qty 0.5

## 2015-02-19 NOTE — Progress Notes (Addendum)
SICKLE CELL SERVICE PROGRESS NOTE  Carmen [name] YYT:[id number] DOB: May 14, 1991 DOA: 02/15/2015 PCP: [name], MD  Assessment/Plan: Principal Problem:   Sickle cell anemia with pain Active Problems:   Thrombocytosis   Hypokalemia  1. 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 Currently her PCA is set such that it locks out after only 3 doses. I have adjusted PCA to allow for a full 1 hour dosing and kept her bolus at 0.5 mg. I will assess her use and pain control over the next 4 hours and make adjustments in her care. Continue Toradol and IVF. Will also start patient on Hydrea 500 mg as she will be following up with me in the office on 02/27/2015 for follow up on CBC and reticulocyte count. 2. Leukocytosis: Pt has a mild leukocytosis which is likely related to her crisis.  Will continue to monitor. 3. Pharyngitis: Pt c/o sore throat but on examination has no findings of infection. Recommend warm salt water gargle and throat lozenges for symptomatic treatment. 4. Anemia of chronic disease: Per patients report Hb stable. 5. Thrombocytosis: I suspect that this is related to her Hb SS. I anticipate that she will have a decrease in platelet count once she is on Hydrea. However in the mean timw I will start on a daily ASA 81 mg and re-assess in the out patient setting.   Code Status: Full Code Family Communication: N/A Disposition Plan: Not yet ready for discharge  [place].  Pager [phone]. If 7PM-7AM, please contact night-coverage.  02/19/2015, 12:29 PM  LOS: 3 days    Consultants:  None  Procedures:  None  Antibiotics:  None  HPI/Subjective: Pt reports that she is unable to get any medication after the 3rd dose and her pain is escalating back up. It is localized to the back and legs.   Objective: Filed Vitals:   02/19/15 0345 02/19/15 0610 02/19/15 0751 02/19/15 1100  BP:  128/72   130/76  Pulse:  72  76  Temp:  98.2 F (36.8 C)  98.4 F (36.9 C)  TempSrc:  Oral  Oral  Resp: 19 23 14 18   Height:      Weight:      SpO2: 95% 100% 97% 98%   Weight change:   Intake/Output Summary (Last 24 hours) at 02/19/15 1229 Last data filed at 02/19/15 0606  Gross per 24 hour  Intake 1904.8 ml  Output    800 ml  Net 1104.8 ml    General: Alert, awake, oriented x3, in no acute distress.  HEENT: [place]/AT PEERL, EOMI OROPHARYNX:  Moist, No exudate/ erythema/lesions.  Heart: Regular rate and rhythm, without murmurs, rubs, gallops, PMI non-displaced, no heaves or thrills on palpation.  Lungs: Clear to auscultation, no wheezing or rhonchi noted. No increased vocal fremitus resonant to percussion  Abdomen: Soft, nontender, nondistended, positive bowel sounds, no masses no hepatosplenomegaly noted..  Neuro: No focal neurological deficits noted cranial nerves II through XII grossly intact.  Strength functional in bilateral upper and lower extremities. Musculoskeletal: No warm swelling or erythema around joints, no spinal tenderness noted. Psychiatric: Patient alert and oriented x3, good insight and cognition, good recent to remote recall. Lymph node survey: No cervical axillary or inguinal lymphadenopathy noted.   Data Reviewed: Basic Metabolic Panel:  Recent Labs Lab 02/12/15 1514 02/15/15 2241 02/16/15 0437 02/17/15 0445 02/18/15 0434  NA 136 139 138 135 139  K 4.1 3.3* 3.6 3.6 3.9  CL 106 106 109 103 107  CO2 23 23 22 25 24   GLUCOSE 93 85 103* 110* 89  BUN 11 8 6 6 6   CREATININE 0.49* 0.44* 0.42* 0.45* 0.37*  CALCIUM 9.2 9.3 8.8 9.0 9.2  MG  --   --  1.9  --   --    Liver Function Tests:  Recent Labs Lab 02/12/15 1514 02/15/15 2241 02/16/15 0437 02/17/15 0445 02/18/15 0434  AST 39* 33 36 27 28  ALT 36* 26 26 25 24   ALKPHOS 68 62 52 55 52  BILITOT 1.4* 2.0* 2.3* 1.8* 1.8*  PROT 8.6* 8.2 7.2 7.2 7.3  ALBUMIN 4.5 4.5 3.8 3.8 4.0   No results for  input(s): LIPASE, AMYLASE in the last 168 hours. No results for input(s): AMMONIA in the last 168 hours. CBC:  Recent Labs Lab 02/12/15 1514 02/15/15 2241 02/16/15 0437 02/17/15 0445 02/18/15 0434  WBC 11.4* 10.0 13.0* 11.8* 12.8*  NEUTROABS 6.4 4.8 5.5 4.5 5.6  HGB 9.7* 9.1* 7.9* 8.2* 8.4*  HCT 28.0* 26.1* 22.4* 23.3* 24.0*  MCV 85.1 85.6 84.5 83.5 84.5  PLT 633* 633* 584* 593* 602*   Cardiac Enzymes: No results for input(s): CKTOTAL, CKMB, CKMBINDEX, TROPONINI in the last 168 hours. BNP (last 3 results) No results for input(s): BNP in the last 8760 hours.  ProBNP (last 3 results) No results for input(s): PROBNP in the last 8760 hours.  CBG: No results for input(s): GLUCAP in the last 168 hours.  No results found for this or any previous visit (from the past 240 hour(s)).   Studies: No results found.  Scheduled Meds: . enoxaparin (LOVENOX) injection  40 mg Subcutaneous Q24H  . feeding supplement (ENSURE ENLIVE)  237 mL Oral BID BM  . folic acid  1 mg Oral Daily  .  HYDROmorphone (DILAUDID) injection  2 mg Intravenous Once  . HYDROmorphone PCA 0.3 mg/mL   Intravenous 6 times per day  . ketorolac  30 mg Intravenous 4 times per day  . senna-docusate  1 tablet Oral BID   Continuous Infusions: . sodium chloride 75 mL/hr at 02/19/15 0300    Time spent 40 minutes.

## 2015-02-20 MED ORDER — HYDROMORPHONE 0.3 MG/ML IV SOLN
INTRAVENOUS | Status: DC
  Administered 2015-02-20: 14:00:00 via INTRAVENOUS
  Administered 2015-02-20: 1.39 mg via INTRAVENOUS
  Administered 2015-02-20: 9.16 mg via INTRAVENOUS
  Administered 2015-02-21: 4.78 mg via INTRAVENOUS
  Administered 2015-02-21 (×2): via INTRAVENOUS
  Administered 2015-02-21: 2.8 mg via INTRAVENOUS
  Administered 2015-02-21: 4.18 mg via INTRAVENOUS
  Filled 2015-02-20 (×3): qty 25

## 2015-02-20 NOTE — Progress Notes (Signed)
Subjective: Patient still complains of 9/10 pain on the new low dose PCA at 05 mg bolus dose. Has used 16.75 mg in the last 24 hours. She is able to walk up and down the hallway but still complaining of more pain. She is on her oral medications as well as the PCA. She is nave to the narcotics but has been taking for a long period of time at 30 mg morphine equivalence a day.  Objective: Vital signs in last 24 hours: Temp:  [97.5 F (36.4 C)-98.4 F (36.9 C)] 97.5 F (36.4 C) (04/21 0500) Pulse Rate:  [68-89] 68 (04/21 0500) Resp:  [10-18] 16 (04/21 0728) BP: (108-134)/(62-89) 122/67 mmHg (04/21 0500) SpO2:  [93 %-100 %] 100 % (04/21 0800) Weight:  [86.637 kg (191 lb)] 86.637 kg (191 lb) (04/21 0500) Weight change: -1.724 kg (-3 lb 12.8 oz) Last BM Date: 02/12/15  Intake/Output from previous day: 04/20 0701 - 04/21 0700 In: 2135.8 [P.O.:240; I.V.:1895.8] Out: 1850 [Urine:1850] Intake/Output this shift:    General appearance: alert, cooperative, appears stated age and no distress Head: Normocephalic, without obvious abnormality, atraumatic Eyes: conjunctivae/corneas clear. PERRL, EOM's intact. Fundi benign. Throat: lips, mucosa, and tongue normal; teeth and gums normal Neck: no adenopathy, no carotid bruit, no JVD, supple, symmetrical, trachea midline and thyroid not enlarged, symmetric, no tenderness/mass/nodules Back: symmetric, no curvature. ROM normal. No CVA tenderness. Resp: clear to auscultation bilaterally Chest wall: no tenderness Cardio: regular rate and rhythm, S1, S2 normal, no murmur, click, rub or gallop GI: soft, non-tender; bowel sounds normal; no masses,  no organomegaly Extremities: extremities normal, atraumatic, no cyanosis or edema Pulses: 2+ and symmetric Skin: Skin color, texture, turgor normal. No rashes or lesions Neurologic: Grossly normal  Lab Results:  Recent Labs  02/18/15 0434  WBC 12.8*  HGB 8.4*  HCT 24.0*  PLT 602*   BMET  Recent  Labs  02/18/15 0434  NA 139  K 3.9  CL 107  CO2 24  GLUCOSE 89  BUN 6  CREATININE 0.37*  CALCIUM 9.2    Studies/Results: No results found.  Medications: I have reviewed the patient's current medications.  Assessment/Plan: A 24 year old female admitted with sickle cell painful crisis.  #1 sickle cell painful crisis: I will change patient's  bolus dose to 0.7 mg. Encourage patient to use a PCA maximally and if doing better in 24 hours will titrate her off.  #2 sickle cell anemia: Hemoglobin has remained stable at her baseline. Continue to monitor  #3 leukocytosis: Due to her sickle cell vaso-occlusive crisis. Continue to monitor.  #4 hypokalemia: This has been repleted.  #5 possible obstructive sleep apnea: Based on patient's history. She will need to follow-up with her primary care physician for outpatient sleep study.  LOS: 4 days   Latavia Goga,LAWAL 02/20/2015, 8:48 AM

## 2015-02-21 LAB — CBC WITH DIFFERENTIAL/PLATELET
BASOS ABS: 0.1 10*3/uL (ref 0.0–0.1)
BASOS PCT: 0 % (ref 0–1)
Eosinophils Absolute: 0.4 10*3/uL (ref 0.0–0.7)
Eosinophils Relative: 3 % (ref 0–5)
HEMATOCRIT: 24.8 % — AB (ref 36.0–46.0)
Hemoglobin: 8.7 g/dL — ABNORMAL LOW (ref 12.0–15.0)
Lymphocytes Relative: 43 % (ref 12–46)
Lymphs Abs: 5.9 10*3/uL — ABNORMAL HIGH (ref 0.7–4.0)
MCH: 29.8 pg (ref 26.0–34.0)
MCHC: 35.1 g/dL (ref 30.0–36.0)
MCV: 84.9 fL (ref 78.0–100.0)
Monocytes Absolute: 1.3 10*3/uL — ABNORMAL HIGH (ref 0.1–1.0)
Monocytes Relative: 10 % (ref 3–12)
Neutro Abs: 6 10*3/uL (ref 1.7–7.7)
Neutrophils Relative %: 44 % (ref 43–77)
Platelets: 665 10*3/uL — ABNORMAL HIGH (ref 150–400)
RBC: 2.92 MIL/uL — ABNORMAL LOW (ref 3.87–5.11)
RDW: 16.3 % — AB (ref 11.5–15.5)
WBC: 13.6 10*3/uL — ABNORMAL HIGH (ref 4.0–10.5)

## 2015-02-21 LAB — COMPREHENSIVE METABOLIC PANEL
ALT: 20 U/L (ref 0–35)
AST: 24 U/L (ref 0–37)
Albumin: 3.7 g/dL (ref 3.5–5.2)
Alkaline Phosphatase: 56 U/L (ref 39–117)
Anion gap: 6 (ref 5–15)
BUN: 6 mg/dL (ref 6–23)
CO2: 26 mmol/L (ref 19–32)
Calcium: 8.8 mg/dL (ref 8.4–10.5)
Chloride: 104 mmol/L (ref 96–112)
Creatinine, Ser: 0.43 mg/dL — ABNORMAL LOW (ref 0.50–1.10)
GFR calc Af Amer: >90 mL/min (ref >90)
Glucose, Bld: 91 mg/dL (ref 70–99)
Potassium: 4 mmol/L (ref 3.5–5.1)
SODIUM: 136 mmol/L (ref 135–145)
TOTAL PROTEIN: 6.9 g/dL (ref 6.0–8.3)
Total Bilirubin: 1.1 mg/dL (ref 0.3–1.2)

## 2015-02-21 MED ORDER — HYDROMORPHONE 0.3 MG/ML IV SOLN
INTRAVENOUS | Status: DC
  Administered 2015-02-21: 7.51 mg via INTRAVENOUS
  Administered 2015-02-21: 23:00:00 via INTRAVENOUS
  Administered 2015-02-22: 4.49 mg via INTRAVENOUS
  Administered 2015-02-22: 4.99 mg via INTRAVENOUS
  Administered 2015-02-22: 5.14 mg via INTRAVENOUS
  Administered 2015-02-22: 04:00:00 via INTRAVENOUS
  Filled 2015-02-21 (×2): qty 25

## 2015-02-21 MED ORDER — HYDROCODONE-ACETAMINOPHEN 10-325 MG PO TABS
1.0000 | ORAL_TABLET | ORAL | Status: DC
  Administered 2015-02-21 – 2015-02-22 (×7): 1 via ORAL
  Filled 2015-02-21 (×7): qty 1

## 2015-02-21 MED ORDER — HYDROMORPHONE 0.3 MG/ML IV SOLN
INTRAVENOUS | Status: DC
  Administered 2015-02-21 (×2): via INTRAVENOUS
  Filled 2015-02-21: qty 25

## 2015-02-21 MED ORDER — LIP MEDEX EX OINT
TOPICAL_OINTMENT | CUTANEOUS | Status: AC
  Administered 2015-02-21: 18:00:00
  Filled 2015-02-21: qty 7

## 2015-02-21 NOTE — Progress Notes (Signed)
NUTRITION FOLLOW-UP   INTERVENTION: - Continue Ensure Enlive po BID, each supplement provides 350 kcal and 20 grams of protein - Encourage PO intake - RD to continue to monitor PO intake  NUTRITION DIAGNOSIS: Inadequate oral intake related to sickle cell pain as evidenced by poor PO intake <25% x 1 week. -resolving within 50% intakes on average and supplement use  Goal: Pt to meet >/= 90% of their estimated nutrition needs -variably meeting  Monitor:  PO and supplemental intake, weight, labs, I/O's  Admitting Dx: Sickle cell anemia with pain  ASSESSMENT: 24 y.o. female with Past medical history of sickle cell pain, depression. The patient is presenting with complaints of back pain. The pain is typical of her sickle cell pain.  Pt eating 50% on average since last assessment. Pt on phone at time of visit and was brief in talking with RD. She reports appetite has been improving although she is still unable to eat all of her food. She reports that she really like Ensure Enlive stating "I could drink a case of them all day every day."  Pt continues with pain and falls related to sickle cell crisis. Meeting nutrition needs on average. Labs and medication reviewed; creatinine low.  Height: Ht Readings from Last 1 Encounters:  02/16/15 5\' 3"  (1.6 m)    Weight: Wt Readings from Last 1 Encounters:  02/21/15 184 lb 4.8 oz (83.598 kg)    Wt Readings from Last 10 Encounters:  02/21/15 184 lb 4.8 oz (83.598 kg)  05/16/14 198 lb 6.6 oz (90 kg)  03/28/14 186 lb 12.8 oz (84.732 kg)  01/29/14 135 lb (61.236 kg)  11/12/13 183 lb 9.6 oz (83.28 kg)  11/10/13 170 lb (77.111 kg)  11/02/13 160 lb (72.576 kg)  09/03/13 170 lb (77.111 kg)  06/12/13 195 lb (88.451 kg)  06/10/13 195 lb (88.451 kg)    Usual Body Weight: 195 lb  % Usual Body Weight: 95%  BMI:  Body mass index is 32.66 kg/(m^2).  Estimated Nutritional Needs: Kcal: 1500-1700 Protein: 60-70g Fluid: 1.5L/day  Skin:  intact  Diet Order: Diet regular Room service appropriate?: Yes    Intake/Output Summary (Last 24 hours) at 02/21/15 1437 Last data filed at 02/21/15 1412  Gross per 24 hour  Intake 2648.67 ml  Output    700 ml  Net 1948.67 ml    Last BM: 4/16  Labs:   Recent Labs Lab 02/16/15 0437 02/17/15 0445 02/18/15 0434 02/21/15 0445  NA 138 135 139 136  K 3.6 3.6 3.9 4.0  CL 109 103 107 104  CO2 22 25 24 26   BUN 6 6 6 6   CREATININE 0.42* 0.45* 0.37* 0.43*  CALCIUM 8.8 9.0 9.2 8.8  MG 1.9  --   --   --   GLUCOSE 103* 110* 89 91    CBG (last 3)  No results for input(s): GLUCAP in the last 72 hours.  Scheduled Meds: . aspirin EC  81 mg Oral Daily  . enoxaparin (LOVENOX) injection  40 mg Subcutaneous Q24H  . feeding supplement (ENSURE ENLIVE)  237 mL Oral BID BM  . folic acid  1 mg Oral Daily  . HYDROcodone-acetaminophen  1 tablet Oral Q4H  . HYDROmorphone PCA 0.3 mg/mL   Intravenous 6 times per day  . hydroxyurea  500 mg Oral Daily  . polyethylene glycol  17 g Oral Daily  . senna-docusate  1 tablet Oral BID    Continuous Infusions: . sodium chloride 50 mL/hr at 02/21/15  31    Past Medical History  Diagnosis Date  . Sickle cell crisis   . Depression   . Headache(784.0)   . Abortion     05/2012    Past Surgical History  Procedure Laterality Date  . Splenectomy    . Cholecystectomy N/A 11/30/2014    Procedure: LAPAROSCOPIC CHOLECYSTECTOMY SINGLE SITE WITH INTRAOPERATIVE CHOLANGIOGRAM;  Surgeon: Michael Boston, MD;  Location: [facility];  Service: General;  Laterality: N/A;    Jarome Matin, RD, LDN Inpatient Clinical Dietitian Pager # [phone] After hours/weekend pager # [phone]

## 2015-02-21 NOTE — Progress Notes (Signed)
SICKLE CELL SERVICE PROGRESS NOTE  Carmen [name] TGY:[id number] DOB: May 06, 1991 DOA: 02/15/2015 PCP: [name], MD    Consultants:  None  Procedures:  none  Antibiotics:  None   HPI/Subjective: Pt states that she feels better today. She rates her pain at a 7/10. She reports having a lapse in her pain where she improved then got worse. She has walked around several times but feels tense in her back and shoulders.  Objective: Filed Vitals:   02/21/15 0826 02/21/15 1000 02/21/15 1407 02/21/15 1415  BP:  110/60  120/74  Pulse:  70  80  Temp:  98.6 F (37 C)  98.4 F (36.9 C)  TempSrc:  Oral  Oral  Resp: 13 14 14 16   Height:      Weight:      SpO2: 97% 100% 100% 100%   Weight change: -6 lb 11.2 oz (-3.039 kg)  Intake/Output Summary (Last 24 hours) at 02/21/15 1543 Last data filed at 02/21/15 1454  Gross per 24 hour  Intake 2888.67 ml  Output   1400 ml  Net 1488.67 ml    General: Alert, awake, oriented x3, NAD HEENT: [place]/AT PEERL, EOMI OROPHARYNX:  Moist, normal oropharynx, no exudate/ erythema/lesions.  Heart: Regular rate and rhythm, without murmurs, rubs, gallops. Lungs: Clear to auscultation, no wheezing or rhonchi noted.  Abdomen: Soft, non-tender, non-distended, no HSM Musculoskeletal: No warmth, swelling, or erythema around joints. Back is non-tender along spine. B/l trapezius muscles are tight. Extremities: no c/c/e   Data Reviewed: Basic Metabolic Panel:  Recent Labs Lab 02/15/15 2241 02/16/15 0437 02/17/15 0445 02/18/15 0434 02/21/15 0445  NA 139 138 135 139 136  K 3.3* 3.6 3.6 3.9 4.0  CL 106 109 103 107 104  CO2 23 22 25 24 26   GLUCOSE 85 103* 110* 89 91  BUN 8 6 6 6 6   CREATININE 0.44* 0.42* 0.45* 0.37* 0.43*  CALCIUM 9.3 8.8 9.0 9.2 8.8  MG  --  1.9  --   --   --    Liver Function Tests:  Recent Labs Lab 02/15/15 2241 02/16/15 0437 02/17/15 0445 02/18/15 0434 02/21/15 0445  AST 33 36 27 28 24   ALT 26 26 25 24 20    ALKPHOS 62 52 55 52 56  BILITOT 2.0* 2.3* 1.8* 1.8* 1.1  PROT 8.2 7.2 7.2 7.3 6.9  ALBUMIN 4.5 3.8 3.8 4.0 3.7   No results for input(s): LIPASE, AMYLASE in the last 168 hours. No results for input(s): AMMONIA in the last 168 hours. CBC:  Recent Labs Lab 02/15/15 2241 02/16/15 0437 02/17/15 0445 02/18/15 0434 02/21/15 0445  WBC 10.0 13.0* 11.8* 12.8* 13.6*  NEUTROABS 4.8 5.5 4.5 5.6 6.0  HGB 9.1* 7.9* 8.2* 8.4* 8.7*  HCT 26.1* 22.4* 23.3* 24.0* 24.8*  MCV 85.6 84.5 83.5 84.5 84.9  PLT 633* 584* 593* 602* 665*   Cardiac Enzymes: No results for input(s): CKTOTAL, CKMB, CKMBINDEX, TROPONINI in the last 168 hours. BNP (last 3 results) No results for input(s): PROBNP in the last 8760 hours. CBG: No results for input(s): GLUCAP in the last 168 hours.  No results found for this or any previous visit (from the past 240 hour(s)).   Studies: No results found.  Scheduled Meds: . aspirin EC  81 mg Oral Daily  . enoxaparin (LOVENOX) injection  40 mg Subcutaneous Q24H  . feeding supplement (ENSURE ENLIVE)  237 mL Oral BID BM  . folic acid  1 mg Oral Daily  . HYDROcodone-acetaminophen  1 tablet Oral Q4H  . HYDROmorphone PCA 0.3 mg/mL   Intravenous 6 times per day  . hydroxyurea  500 mg Oral Daily  . polyethylene glycol  17 g Oral Daily  . senna-docusate  1 tablet Oral BID   Continuous Infusions: . sodium chloride 50 mL/hr at 02/21/15 1349    Principal Problem:   Sickle cell anemia with pain Active Problems:   Thrombocytosis   Hypokalemia   Assessment/Plan: Principal Problem:   Sickle cell anemia with pain Active Problems:   Thrombocytosis   Hypokalemia  1)Sickle Cell Crisis: Pt presented with pain consistent with VOC, and pain is improving slowly. Patient on custom dose PCA,used 22.18 in 24 hours,total demands-36 with 35 deliveries. Will continue Dilaudid PCA but decrease it to 0.5mg  q8 min with lockout of 3mg /hr.  Start Norco 325/10mg  q4h. Will Continue Toradol.  Monitor for improvement. Advised pt to seek massage therapy after discharge to help with her tense muscles that may be attributing to pain. 2) Anemia: Hgb stable at 8.7, this morning. Continue to monitor 3)Sickle Cell Care: Continue Folic acid and hydroxyurea.  4) FEN/GI : Regular diet.   IV fluids 0.45%-decrease to 44ml/hr  Bowel regimen in place   Code Status: Full  DVT Prophylaxis: enoxaparin  Family Communication: none  Disposition Plan: Likely tomorrow if pain is tolerable on PO meds, and she remains stable.   Time spent:25 minutes  Kalman Shan  Pager [phone]. If 7PM-7AM, please contact night-coverage.  02/21/2015, 3:43 PM  LOS: 5 days   Kalman Shan

## 2015-02-21 NOTE — Progress Notes (Signed)
Patient on custom dose PCA,used 22.18 in 24 hours,total demands-36 with 35 deliveries.

## 2015-02-22 LAB — HEMOGLOBIN AND HEMATOCRIT, BLOOD
HCT: 22.5 % — ABNORMAL LOW (ref 36.0–46.0)
Hemoglobin: 7.9 g/dL — ABNORMAL LOW (ref 12.0–15.0)

## 2015-02-22 MED ORDER — HYDROXYUREA 500 MG PO CAPS: 500.0000 mg | ORAL_CAPSULE | Freq: Every day | ORAL | Status: DC

## 2015-02-22 MED ORDER — HYDROCODONE-ACETAMINOPHEN 10-325 MG PO TABS: 1.0000 | ORAL_TABLET | Freq: Four times a day (QID) | ORAL | Status: DC | PRN

## 2015-02-22 NOTE — Progress Notes (Signed)
Discharge teaching completed with teach back. Prescription given for Norco. Discharge instructions sheet given and reviewed with pt. Handout given on Norco. Pt. has follow up appointment information. Awaiting ride to home.

## 2015-02-22 NOTE — Progress Notes (Signed)
Pt. Discharged to home. Left hospital via ambulation with significant other. No respiratory distress noted.

## 2015-02-22 NOTE — Discharge Instructions (Signed)
Anemia, Nonspecific Anemia is a condition in which the concentration of red blood cells or hemoglobin in the blood is below normal. Hemoglobin is a substance in red blood cells that carries oxygen to the tissues of the body. Anemia results in not enough oxygen reaching these tissues.  CAUSES  Common causes of anemia include:   Excessive bleeding. Bleeding may be internal or external. This includes excessive bleeding from periods (in women) or from the intestine.   Poor nutrition.   Chronic kidney, thyroid, and liver disease.  Bone marrow disorders that decrease red blood cell production.  Cancer and treatments for cancer.  HIV, AIDS, and their treatments.  Spleen problems that increase red blood cell destruction.  Blood disorders.  Excess destruction of red blood cells due to infection, medicines, and autoimmune disorders. SIGNS AND SYMPTOMS   Minor weakness.   Dizziness.   Headache.  Palpitations.   Shortness of breath, especially with exercise.   Paleness.  Cold sensitivity.  Indigestion.  Nausea.  Difficulty sleeping.  Difficulty concentrating. Symptoms may occur suddenly or they may develop slowly.  DIAGNOSIS  Additional blood tests are often needed. These help your health care provider determine the best treatment. Your health care provider will check your stool for blood and look for other causes of blood loss.  TREATMENT  Treatment varies depending on the cause of the anemia. Treatment can include:   Supplements of iron, vitamin X52, or folic acid.   Hormone medicines.   A blood transfusion. This may be needed if blood loss is severe.   Hospitalization. This may be needed if there is significant continual blood loss.   Dietary changes.  Spleen removal. HOME CARE INSTRUCTIONS Keep all follow-up appointments. It often takes many weeks to correct anemia, and having your health care provider check on your condition and your response to  treatment is very important. SEEK IMMEDIATE MEDICAL CARE IF:   You develop extreme weakness, shortness of breath, or chest pain.   You become dizzy or have trouble concentrating.  You develop heavy vaginal bleeding.   You develop a rash.   You have bloody or black, tarry stools.   You faint.   You vomit up blood.   You vomit repeatedly.   You have abdominal pain.  You have a fever or persistent symptoms for more than 2-3 days.   You have a fever and your symptoms suddenly get worse.   You are dehydrated.  MAKE SURE YOU:  Understand these instructions.  Will watch your condition.  Will get help right away if you are not doing well or get worse. Document Released: 11/25/2004 Document Revised: 06/20/2013 Document Reviewed: 04/13/2013 [facility] Patient Information 2015 [place], [place]. This information is not intended to replace advice given to you by your health care provider. Make sure you discuss any questions you have with your health care provider.  Sickle Cell Anemia, Adult Sickle cell anemia is a condition in which red blood cells have an abnormal "sickle" shape. This abnormal shape shortens the cells' life span, which results in a lower than normal concentration of red blood cells in the blood. The sickle shape also causes the cells to clump together and block free blood flow through the blood vessels. As a result, the tissues and organs of the body do not receive enough oxygen. Sickle cell anemia causes organ damage and pain and increases the risk of infection. CAUSES  Sickle cell anemia is a genetic disorder. Those who receive two copies of the gene  have the condition, and those who receive one copy have the trait. RISK FACTORS The sickle cell gene is most common in people whose families originated in [place]. Other areas of the globe where sickle cell trait occurs include the [place], [place] and [place], and the [place].  SIGNS  AND SYMPTOMS  Pain, especially in the extremities, back, chest, or abdomen (common). The pain may start suddenly or may develop following an illness, especially if there is dehydration. Pain can also occur due to overexertion or exposure to extreme temperature changes.  Frequent severe bacterial infections, especially certain types of pneumonia and meningitis.  Pain and swelling in the hands and feet.  Decreased activity.   Loss of appetite.   Change in behavior.  Headaches.  Seizures.  Shortness of breath or difficulty breathing.  Vision changes.  Skin ulcers. Those with the trait may not have symptoms or they may have mild symptoms.  DIAGNOSIS  Sickle cell anemia is diagnosed with blood tests that demonstrate the genetic trait. It is often diagnosed during the newborn period, due to mandatory testing nationwide. A variety of blood tests, X-rays, CT scans, MRI scans, ultrasounds, and lung function tests may also be done to monitor the condition. TREATMENT  Sickle cell anemia may be treated with:  Medicines. You may be given pain medicines, antibiotic medicines (to treat and prevent infections) or medicines to increase the production of certain types of hemoglobin.  Fluids.  Oxygen.  Blood transfusions. HOME CARE INSTRUCTIONS   Drink enough fluid to keep your urine clear or pale yellow. Increase your fluid intake in hot weather and during exercise.  Do not smoke. Smoking lowers oxygen levels in the blood.   Only take over-the-counter or prescription medicines for pain, fever, or discomfort as directed by your health care provider.  Take antibiotics as directed by your health care provider. Make sure you finish them it even if you start to feel better.   Take supplements as directed by your health care provider.   Consider wearing a medical alert bracelet. This tells anyone caring for you in an emergency of your condition.   When traveling, keep your medical  information, health care provider's names, and the medicines you take with you at all times.   If you develop a fever, do not take medicines to reduce the fever right away. This could cover up a problem that is developing. Notify your health care provider.  Keep all follow-up appointments with your health care provider. Sickle cell anemia requires regular medical care. SEEK MEDICAL CARE IF: You have a fever. SEEK IMMEDIATE MEDICAL CARE IF:   You feel dizzy or faint.   You have new abdominal pain, especially on the left side near the stomach area.   You develop a persistent, often uncomfortable and painful penile erection (priapism). If this is not treated immediately it will lead to impotence.   You have numbness your arms or legs or you have a hard time moving them.   You have a hard time with speech.   You have a fever or persistent symptoms for more than 2-3 days.   You have a fever and your symptoms suddenly get worse.   You have signs or symptoms of infection. These include:   Chills.   Abnormal tiredness (lethargy).   Irritability.   Poor eating.   Vomiting.   You develop pain that is not helped with medicine.   You develop shortness of breath.  You have pain  in your chest.   You are coughing up pus-like or bloody sputum.   You develop a stiff neck.  Your feet or hands swell or have pain.  Your abdomen appears bloated.  You develop joint pain. MAKE SURE YOU:  Understand these instructions. Document Released: 01/26/2006 Document Revised: 03/04/2014 Document Reviewed: 05/30/2013 [facility] Patient Information 2015 [place], [place]. This information is not intended to replace advice given to you by your health care provider. Make sure you discuss any questions you have with your health care provider.

## 2015-02-22 NOTE — Discharge Summary (Signed)
Sickle-Cell Discharge Summary  Carmen [name] JOI:[id number] DOB: 05/11/1991 DOA: 02/15/2015  PCP: [name], MD  Admit date: 02/15/2015 Discharge date: 02/22/2015  Discharge Diagnoses:  Principal Problem:   Sickle cell anemia with pain Active Problems:   Thrombocytosis   Hypokalemia   Discharge Condition: stable/improved  Disposition: Home     Follow-up Information    Follow up with [name], MD In 2 days.   Specialty:  Family Medicine   Why:  previously set appt   Contact information:   [place] [address] [phone]       Follow up with MATTHEWS,MICHELLE A., MD In 5 days.   Specialty:  Internal Medicine   Contact information:   [place] [address] [phone]       Diet:Soft Diet. May advance to regular diet as tolerated.  Wt Readings from Last 3 Encounters:  02/21/15 184 lb 4.8 oz (83.598 kg)  05/16/14 198 lb 6.6 oz (90 kg)  03/28/14 186 lb 12.8 oz (84.732 kg)    History of present illness:  Per chart: Carmen [name] is a 24 y.o. female with Past medical history of sickle cell pain, depression. The patient is presenting with complaints of back pain. The pain is typical of her sickle cell pain. The pain has been ongoing for last 1 week progressively worsening. She was earlier seen in the ER on 02/12/2015 and was treated in the ER with pain medication and was sent home. She tried to take her pain medication but her pain was not improving and therefore she came back to the ER on 02/15/2015. Patient denies any complaints of fall, trauma, injury, headache, cough, chest pain, abdominal pain, nausea, vomiting, diarrhea, burning urination. She is not taking fully cancer hydroxyurea as she mentions she is not prescribed. She denies any focal deficit.  The patient is coming from home And at her baseline independent for most of her ADL.  Hospital Course by Problem:  Sickle Cell Crisis: Patient  presented with pain characteristic of acute vaso-occlusive crisis.Pt's pain was treated with bolus IV analgesics initially and was later transitioned with a Dilaudid PCA and ketorolac. Her pain was well controlled with her oral home regimen without much need for IV pain control. She had overall improvement of her pain and was physically functional upon discharge. She was started on Hydroxyurea here which likely helped her during this acute crisis. Pt will resume Hydrea as an outpatient and will follow-up with Dr. Zigmund Daniel in 5 days. She was given a 2 day prescription for Norco for pain management at home until she can see Dr. Alyson Ingles in 2 days.  Anemia: Pt's Hgb was stable around her baseline of ~8. Pt advised to go to ED if feeling dizzy, having SOB, Heart racing/palpitations, and/or bleeding.    Discharge Exam: Filed Vitals:   02/22/15 1030  BP: 113/65  Pulse: 75  Temp: 98.7 F (37.1 C)  Resp: 19   Filed Vitals:   02/22/15 0400 02/22/15 0618 02/22/15 0800 02/22/15 1030  BP:  126/80  113/65  Pulse:  89  75  Temp:  98 F (36.7 C)  98.7 F (37.1 C)  TempSrc:  Oral  Oral  Resp: 11 18 16 19   Height:      Weight:      SpO2: 100% 100% 98% 100%   General: Alert, awake, oriented x3, in no acute distress.  HEENT: [place]/AT PEERL, EOMI Neck: Trachea midline,  no  masses, no LAD, no carotid bruit OROPHARYNX:  Moist, Nl oropharynx without exudate/erythema/lesions.  Heart: Regular rate and rhythm, without murmurs, rubs, gallops Lungs: Clear to auscultation, no wheezing or rhonchi noted.  Abdomen: Soft, nontender, nondistended, positive bowel sounds, no masses no hepatosplenomegaly noted.  Neuro: No focal neurological deficits noted cranial nerves II through XII grossly intact. 5/5/UE and LE strength. Nl gait. Extremities: No clubbing, cyanosis, or edema. Musculoskeletal: no joint erythema, edema, or tenderness.   Discharge Instructions As above    Medication List    STOP taking these  medications        oxyCODONE-acetaminophen 5-325 MG per tablet  Commonly known as:  PERCOCET      TAKE these medications        aspirin 81 MG EC tablet  Take 1 tablet (81 mg total) by mouth daily.     cetirizine 10 MG tablet  Commonly known as:  ZYRTEC  Take 10 mg by mouth daily.     diphenhydramine-acetaminophen 25-500 MG Tabs  Commonly known as:  TYLENOL PM  Take 2 tablets by mouth as needed (pain/sleep).     folic acid 1 MG tablet  Commonly known as:  FOLVITE  Take 1 tablet (1 mg total) by mouth daily.     HYDROcodone-acetaminophen 10-325 MG per tablet  Commonly known as:  NORCO  Take 1 tablet by mouth every 6 (six) hours as needed for moderate pain or severe pain (pain).     hydroxyurea 500 MG capsule  Commonly known as:  HYDREA  Take 1 capsule (500 mg total) by mouth daily. May take with food to minimize GI side effects.          The results of significant diagnostics from this hospitalization (including imaging, microbiology, ancillary and laboratory) are listed below for reference.    Significant Diagnostic Studies: No results found.  Microbiology: No results found for this or any previous visit (from the past 240 hour(s)).   Labs: Basic Metabolic Panel:  Recent Labs Lab 02/15/15 2241 02/16/15 0437 02/17/15 0445 02/18/15 0434 02/21/15 0445  NA 139 138 135 139 136  K 3.3* 3.6 3.6 3.9 4.0  CL 106 109 103 107 104  CO2 23 22 25 24 26   GLUCOSE 85 103* 110* 89 91  BUN 8 6 6 6 6   CREATININE 0.44* 0.42* 0.45* 0.37* 0.43*  CALCIUM 9.3 8.8 9.0 9.2 8.8  MG  --  1.9  --   --   --    Liver Function Tests:  Recent Labs Lab 02/15/15 2241 02/16/15 0437 02/17/15 0445 02/18/15 0434 02/21/15 0445  AST 33 36 27 28 24   ALT 26 26 25 24 20   ALKPHOS 62 52 55 52 56  BILITOT 2.0* 2.3* 1.8* 1.8* 1.1  PROT 8.2 7.2 7.2 7.3 6.9  ALBUMIN 4.5 3.8 3.8 4.0 3.7   No results for input(s): LIPASE, AMYLASE in the last 168 hours. No results for input(s): AMMONIA in  the last 168 hours. CBC:  Recent Labs Lab 02/15/15 2241 02/16/15 0437 02/17/15 0445 02/18/15 0434 02/21/15 0445 02/22/15 0415  WBC 10.0 13.0* 11.8* 12.8* 13.6*  --   NEUTROABS 4.8 5.5 4.5 5.6 6.0  --   HGB 9.1* 7.9* 8.2* 8.4* 8.7* 7.9*  HCT 26.1* 22.4* 23.3* 24.0* 24.8* 22.5*  MCV 85.6 84.5 83.5 84.5 84.9  --   PLT 633* 584* 593* 602* 665*  --    Cardiac Enzymes: No results for input(s): CKTOTAL, CKMB, CKMBINDEX, TROPONINI in the last 168  hours. BNP: Invalid input(s): POCBNP CBG: No results for input(s): GLUCAP in the last 168 hours.  Time coordinating discharge: >30 min.  SignedKalman Shan Sickle Cell Service 02/22/2015, 1:50 PM

## 2015-02-25 ENCOUNTER — Encounter (HOSPITAL_COMMUNITY): Payer: Self-pay | Admitting: Emergency Medicine

## 2015-02-25 ENCOUNTER — Inpatient Hospital Stay (HOSPITAL_COMMUNITY)
Admission: EM | Admit: 2015-02-25 | Discharge: 2015-02-27 | DRG: 811 | Discharge status: Against Medical Advice | Payer: Medicaid Other | Attending: Internal Medicine | Admitting: Internal Medicine

## 2015-02-25 ENCOUNTER — Inpatient Hospital Stay (HOSPITAL_COMMUNITY): Payer: Medicaid Other

## 2015-02-25 ENCOUNTER — Emergency Department (HOSPITAL_COMMUNITY): Payer: Medicaid Other

## 2015-02-25 DIAGNOSIS — Z7982 Long term (current) use of aspirin: Secondary | ICD-10-CM

## 2015-02-25 DIAGNOSIS — R1110 Vomiting, unspecified: Secondary | ICD-10-CM

## 2015-02-25 DIAGNOSIS — J189 Pneumonia, unspecified organism: Secondary | ICD-10-CM | POA: Diagnosis present

## 2015-02-25 DIAGNOSIS — Z79899 Other long term (current) drug therapy: Secondary | ICD-10-CM | POA: Diagnosis not present

## 2015-02-25 DIAGNOSIS — D638 Anemia in other chronic diseases classified elsewhere: Secondary | ICD-10-CM | POA: Diagnosis present

## 2015-02-25 DIAGNOSIS — Z8249 Family history of ischemic heart disease and other diseases of the circulatory system: Secondary | ICD-10-CM | POA: Diagnosis not present

## 2015-02-25 DIAGNOSIS — Z9049 Acquired absence of other specified parts of digestive tract: Secondary | ICD-10-CM | POA: Diagnosis present

## 2015-02-25 DIAGNOSIS — G4733 Obstructive sleep apnea (adult) (pediatric): Secondary | ICD-10-CM | POA: Diagnosis present

## 2015-02-25 DIAGNOSIS — R079 Chest pain, unspecified: Secondary | ICD-10-CM

## 2015-02-25 DIAGNOSIS — R0789 Other chest pain: Secondary | ICD-10-CM | POA: Diagnosis not present

## 2015-02-25 DIAGNOSIS — D473 Essential (hemorrhagic) thrombocythemia: Secondary | ICD-10-CM | POA: Diagnosis present

## 2015-02-25 DIAGNOSIS — Z9081 Acquired absence of spleen: Secondary | ICD-10-CM | POA: Diagnosis present

## 2015-02-25 DIAGNOSIS — Y95 Nosocomial condition: Secondary | ICD-10-CM | POA: Diagnosis present

## 2015-02-25 DIAGNOSIS — Z832 Family history of diseases of the blood and blood-forming organs and certain disorders involving the immune mechanism: Secondary | ICD-10-CM

## 2015-02-25 DIAGNOSIS — D5700 Hb-SS disease with crisis, unspecified: Secondary | ICD-10-CM | POA: Diagnosis present

## 2015-02-25 LAB — CBC WITH DIFFERENTIAL/PLATELET
BASOS ABS: 0 10*3/uL (ref 0.0–0.1)
BASOS PCT: 0 % (ref 0–1)
EOS ABS: 0.1 10*3/uL (ref 0.0–0.7)
Eosinophils Relative: 1 % (ref 0–5)
HCT: 28 % — ABNORMAL LOW (ref 36.0–46.0)
HEMOGLOBIN: 9.7 g/dL — AB (ref 12.0–15.0)
Lymphocytes Relative: 31 % (ref 12–46)
Lymphs Abs: 2.8 10*3/uL (ref 0.7–4.0)
MCH: 29.5 pg (ref 26.0–34.0)
MCHC: 34.6 g/dL (ref 30.0–36.0)
MCV: 85.1 fL (ref 78.0–100.0)
Monocytes Absolute: 1.2 10*3/uL — ABNORMAL HIGH (ref 0.1–1.0)
Monocytes Relative: 13 % — ABNORMAL HIGH (ref 3–12)
Neutro Abs: 4.9 10*3/uL (ref 1.7–7.7)
Neutrophils Relative %: 55 % (ref 43–77)
Platelets: 680 10*3/uL — ABNORMAL HIGH (ref 150–400)
RBC: 3.29 MIL/uL — AB (ref 3.87–5.11)
RDW: 15.2 % (ref 11.5–15.5)
WBC: 8.9 10*3/uL (ref 4.0–10.5)

## 2015-02-25 LAB — COMPREHENSIVE METABOLIC PANEL
ALBUMIN: 4.7 g/dL (ref 3.5–5.2)
ALT: 30 U/L (ref 0–35)
AST: 27 U/L (ref 0–37)
Alkaline Phosphatase: 56 U/L (ref 39–117)
Anion gap: 7 (ref 5–15)
BILIRUBIN TOTAL: 1.7 mg/dL — AB (ref 0.3–1.2)
BUN: 8 mg/dL (ref 6–23)
CO2: 23 mmol/L (ref 19–32)
Calcium: 9.7 mg/dL (ref 8.4–10.5)
Chloride: 108 mmol/L (ref 96–112)
Creatinine, Ser: 0.49 mg/dL — ABNORMAL LOW (ref 0.50–1.10)
Glucose, Bld: 100 mg/dL — ABNORMAL HIGH (ref 70–99)
Potassium: 3.6 mmol/L (ref 3.5–5.1)
SODIUM: 138 mmol/L (ref 135–145)
TOTAL PROTEIN: 8.6 g/dL — AB (ref 6.0–8.3)

## 2015-02-25 LAB — RETICULOCYTES
RBC.: 3.29 MIL/uL — AB (ref 3.87–5.11)
Retic Count, Absolute: 250 10*3/uL — ABNORMAL HIGH (ref 19.0–186.0)
Retic Ct Pct: 7.6 % — ABNORMAL HIGH (ref 0.4–3.1)

## 2015-02-25 LAB — D-DIMER, QUANTITATIVE: D-Dimer, Quant: 0.69 ug/mL-FEU — ABNORMAL HIGH (ref 0.00–0.48)

## 2015-02-25 LAB — LACTATE DEHYDROGENASE: LDH: 223 U/L (ref 94–250)

## 2015-02-25 LAB — POC URINE PREG, ED: Preg Test, Ur: NEGATIVE

## 2015-02-25 MED ORDER — HYDROMORPHONE 0.3 MG/ML IV SOLN
INTRAVENOUS | Status: DC
  Administered 2015-02-25 – 2015-02-26 (×3): 0.3 mg via INTRAVENOUS
  Filled 2015-02-25 (×2): qty 25

## 2015-02-25 MED ORDER — SODIUM CHLORIDE 0.9 % IV BOLUS (SEPSIS)
1000.0000 mL | Freq: Once | INTRAVENOUS | Status: AC
  Administered 2015-02-25: 1000 mL via INTRAVENOUS

## 2015-02-25 MED ORDER — HYDROMORPHONE HCL 2 MG/ML IJ SOLN
3.0000 mg | Freq: Once | INTRAMUSCULAR | Status: AC
  Administered 2015-02-25: 3 mg via INTRAVENOUS
  Filled 2015-02-25: qty 2

## 2015-02-25 MED ORDER — ONDANSETRON HCL 4 MG/2ML IJ SOLN
4.0000 mg | INTRAMUSCULAR | Status: DC | PRN
Start: 2015-02-25 — End: 2015-02-26

## 2015-02-25 MED ORDER — NALOXONE HCL 0.4 MG/ML IJ SOLN: 0.4000 mg | INTRAMUSCULAR | Status: DC | PRN

## 2015-02-25 MED ORDER — POLYETHYLENE GLYCOL 3350 17 G PO PACK
17.0000 g | PACK | Freq: Every day | ORAL | Status: DC | PRN
Start: 2015-02-25 — End: 2015-02-27

## 2015-02-25 MED ORDER — SODIUM CHLORIDE 0.9 % IJ SOLN: 9.0000 mL | INTRAMUSCULAR | Status: DC | PRN

## 2015-02-25 MED ORDER — ACETAMINOPHEN 650 MG RE SUPP: 650.0000 mg | RECTAL | Status: DC | PRN

## 2015-02-25 MED ORDER — ENOXAPARIN SODIUM 40 MG/0.4ML ~~LOC~~ SOLN
40.0000 mg | SUBCUTANEOUS | Status: DC
  Administered 2015-02-25 – 2015-02-26 (×2): 40 mg via SUBCUTANEOUS
  Filled 2015-02-25 (×3): qty 0.4

## 2015-02-25 MED ORDER — HYDROXYUREA 500 MG PO CAPS
500.0000 mg | ORAL_CAPSULE | Freq: Every day | ORAL | Status: DC
  Administered 2015-02-25 – 2015-02-27 (×3): 500 mg via ORAL
  Filled 2015-02-25 (×3): qty 1

## 2015-02-25 MED ORDER — DEXTROSE 5 % IV SOLN
1.0000 g | Freq: Three times a day (TID) | INTRAVENOUS | Status: DC
  Administered 2015-02-25 – 2015-02-26 (×2): 1 g via INTRAVENOUS
  Filled 2015-02-25 (×3): qty 1

## 2015-02-25 MED ORDER — ASPIRIN EC 81 MG PO TBEC
81.0000 mg | DELAYED_RELEASE_TABLET | Freq: Every day | ORAL | Status: DC
  Administered 2015-02-25 – 2015-02-27 (×3): 81 mg via ORAL
  Filled 2015-02-25 (×3): qty 1

## 2015-02-25 MED ORDER — LORATADINE 10 MG PO TABS
10.0000 mg | ORAL_TABLET | Freq: Every day | ORAL | Status: DC
  Administered 2015-02-26 – 2015-02-27 (×2): 10 mg via ORAL
  Filled 2015-02-25 (×2): qty 1

## 2015-02-25 MED ORDER — HYDROMORPHONE HCL 2 MG/ML IJ SOLN
2.0000 mg | Freq: Once | INTRAMUSCULAR | Status: AC
  Administered 2015-02-25: 2 mg via INTRAMUSCULAR
  Filled 2015-02-25: qty 1

## 2015-02-25 MED ORDER — KETOROLAC TROMETHAMINE 30 MG/ML IJ SOLN
30.0000 mg | Freq: Once | INTRAMUSCULAR | Status: AC
  Administered 2015-02-25: 30 mg via INTRAMUSCULAR
  Filled 2015-02-25: qty 1

## 2015-02-25 MED ORDER — SODIUM CHLORIDE 0.9 % IV SOLN: INTRAVENOUS | Status: DC

## 2015-02-25 MED ORDER — FOLIC ACID 1 MG PO TABS
1.0000 mg | ORAL_TABLET | Freq: Every day | ORAL | Status: DC
  Administered 2015-02-25 – 2015-02-27 (×3): 1 mg via ORAL
  Filled 2015-02-25 (×3): qty 1

## 2015-02-25 MED ORDER — ACETAMINOPHEN 325 MG PO TABS: 650.0000 mg | ORAL_TABLET | ORAL | Status: DC | PRN

## 2015-02-25 MED ORDER — IOHEXOL 350 MG/ML SOLN: 100.0000 mL | Freq: Once | INTRAVENOUS | Status: AC | PRN

## 2015-02-25 MED ORDER — SODIUM CHLORIDE 0.45 % IV SOLN
INTRAVENOUS | Status: AC
  Administered 2015-02-25 – 2015-02-26 (×3): via INTRAVENOUS

## 2015-02-25 MED ORDER — PROMETHAZINE HCL 25 MG/ML IJ SOLN
25.0000 mg | Freq: Once | INTRAMUSCULAR | Status: AC
  Administered 2015-02-25: 25 mg via INTRAVENOUS
  Filled 2015-02-25: qty 1

## 2015-02-25 MED ORDER — HYDROMORPHONE HCL 2 MG/ML IJ SOLN
2.0000 mg | Freq: Once | INTRAMUSCULAR | Status: AC
  Administered 2015-02-25: 2 mg via INTRAVENOUS
  Filled 2015-02-25: qty 1

## 2015-02-25 MED ORDER — ONDANSETRON HCL 4 MG PO TABS
4.0000 mg | ORAL_TABLET | ORAL | Status: DC | PRN
  Administered 2015-02-26: 4 mg via ORAL
  Filled 2015-02-25: qty 1

## 2015-02-25 MED ORDER — VANCOMYCIN HCL IN DEXTROSE 1-5 GM/200ML-% IV SOLN
1000.0000 mg | Freq: Three times a day (TID) | INTRAVENOUS | Status: DC
  Administered 2015-02-25 – 2015-02-26 (×2): 1000 mg via INTRAVENOUS
  Filled 2015-02-25 (×3): qty 200

## 2015-02-25 MED ORDER — LORAZEPAM 2 MG/ML IJ SOLN
1.0000 mg | Freq: Once | INTRAMUSCULAR | Status: AC
  Administered 2015-02-25: 1 mg via INTRAVENOUS
  Filled 2015-02-25: qty 1

## 2015-02-25 MED ORDER — SENNOSIDES-DOCUSATE SODIUM 8.6-50 MG PO TABS
1.0000 | ORAL_TABLET | Freq: Two times a day (BID) | ORAL | Status: DC
  Administered 2015-02-25 – 2015-02-27 (×4): 1 via ORAL
  Filled 2015-02-25 (×5): qty 1

## 2015-02-25 NOTE — ED Provider Notes (Signed)
CSN: [id number]     Arrival date & time 02/25/15  1027 History   First MD Initiated Contact with Patient 02/25/15 1059     Chief Complaint  Patient presents with  . Sickle Cell Pain Crisis     (Consider location/radiation/quality/duration/timing/severity/associated sxs/prior Treatment) HPI Comments: Patient is a 24 yo F PMHx significant for sickle cell anemia type Hb SS presenting to the ED for evaluation of continued sickle cell pain since being discharged from the hospital three days ago. She states she was initially sore all over with chest tightness and a cough, but today mostly notes left sided lower back pain without radiation and bilateral calf soreness. Endorses this feels like her previous sickle cell anemia pain. She states she was unable to afford her site given prescription for at-home breakthrough pain. No modifying factors identified. Denies any fevers, shortness of breath, nausea, vomiting, diarrhea, abdominal pain.  Patient is a 24 y.o. female presenting with sickle cell pain.  Sickle Cell Pain Crisis Associated symptoms: congestion and cough   Associated symptoms: no shortness of breath     Past Medical History  Diagnosis Date  . Sickle cell crisis   . Depression   . Headache(784.0)   . Abortion     05/2012   Past Surgical History  Procedure Laterality Date  . Splenectomy    . Cholecystectomy N/A 11/30/2014    Procedure: LAPAROSCOPIC CHOLECYSTECTOMY SINGLE SITE WITH INTRAOPERATIVE CHOLANGIOGRAM;  Surgeon: Michael Boston, MD;  Location: [facility] ORS;  Service: General;  Laterality: N/A;   Family History  Problem Relation Age of Onset  . Hypertension Mother   . Sickle cell anemia Sister   . Kidney disease Sister   . Arthritis Sister   . Sickle cell anemia Sister   . Sickle cell trait Sister   . Heart disease Maternal Aunt   . Heart disease Maternal Uncle    History  Substance Use Topics  . Smoking status: Never Smoker   . Smokeless tobacco: Never Used  . Alcohol  Use: No   OB History    Gravida Para Term Preterm AB TAB SAB Ectopic Multiple Living   1    1          Review of Systems  HENT: Positive for congestion.   Respiratory: Positive for cough and chest tightness. Negative for shortness of breath.   Musculoskeletal: Positive for myalgias and back pain.  All other systems reviewed and are negative.     Allergies  Other  Home Medications   Prior to Admission medications   Medication Sig Start Date End Date Taking? Authorizing Provider  aspirin EC 81 MG EC tablet Take 1 tablet (81 mg total) by mouth daily. 02/19/15  Yes Leana Gamer, MD  cetirizine (ZYRTEC) 10 MG tablet Take 10 mg by mouth daily.    Yes Historical Provider, MD  diphenhydramine-acetaminophen (TYLENOL PM) 25-500 MG TABS Take 2 tablets by mouth as needed (pain/sleep).    Yes Historical Provider, MD  folic acid (FOLVITE) 1 MG tablet Take 1 tablet (1 mg total) by mouth daily. 02/19/15  Yes Leana Gamer, MD  HYDROcodone-acetaminophen (NORCO) 10-325 MG per tablet Take 1 tablet by mouth every 6 (six) hours as needed for moderate pain or severe pain (pain). 02/22/15  Yes Olabunmi A Agboola, MD  hydroxyurea (HYDREA) 500 MG capsule Take 1 capsule (500 mg total) by mouth daily. May take with food to minimize GI side effects. 02/22/15  Yes Doy Hutching, MD   BP  117/67 mmHg  Pulse 113  Temp(Src) 98 F (36.7 C) (Oral)  Resp 21  SpO2 96%  LMP 02/13/2015 (Exact Date) Physical Exam  Constitutional: She is oriented to person, place, and time. She appears well-developed and well-nourished. No distress.  HENT:  Head: Normocephalic and atraumatic.  Right Ear: External ear normal.  Left Ear: External ear normal.  Nose: Nose normal.  Mouth/Throat: Oropharynx is clear and moist.  Eyes: Conjunctivae are normal.  Neck: Normal range of motion. Neck supple.  No nuchal rigidity.   Cardiovascular: Normal rate, regular rhythm and normal heart sounds.   Pulmonary/Chest: Effort  normal and breath sounds normal. She exhibits no tenderness.  Abdominal: Soft.  Musculoskeletal: Normal range of motion.       Right hip: Normal.       Left hip: Normal.       Right knee: Normal.       Left knee: Normal.       Lumbar back: She exhibits tenderness. She exhibits normal range of motion and no bony tenderness.       Right upper leg: Normal.       Left upper leg: Normal.       Right lower leg: She exhibits tenderness. She exhibits no bony tenderness, no swelling, no edema and no deformity.       Left lower leg: She exhibits tenderness. She exhibits no bony tenderness, no swelling, no edema and no deformity.  Neurological: She is alert and oriented to person, place, and time.  Skin: Skin is warm and dry. She is not diaphoretic.  Psychiatric: She has a normal mood and affect.  Nursing note and vitals reviewed.   ED Course  Procedures (including critical care time) Medications  sodium chloride 0.9 % bolus 1,000 mL (not administered)  HYDROmorphone (DILAUDID) injection 2 mg (not administered)  0.9 %  sodium chloride infusion (not administered)  ketorolac (TORADOL) 30 MG/ML injection 30 mg (30 mg Intramuscular Given 02/25/15 1215)  HYDROmorphone (DILAUDID) injection 2 mg (2 mg Intramuscular Given 02/25/15 1242)  sodium chloride 0.9 % bolus 1,000 mL (1,000 mLs Intravenous New Bag/Given 02/25/15 1352)  LORazepam (ATIVAN) injection 1 mg (1 mg Intravenous Given 02/25/15 1349)  HYDROmorphone (DILAUDID) injection 3 mg (3 mg Intravenous Given 02/25/15 1426)  promethazine (PHENERGAN) injection 25 mg (25 mg Intravenous Given 02/25/15 1544)    Labs Review Labs Reviewed  CBC WITH DIFFERENTIAL/PLATELET - Abnormal; Notable for the following:    RBC 3.29 (*)    Hemoglobin 9.7 (*)    HCT 28.0 (*)    Platelets 680 (*)    Monocytes Relative 13 (*)    Monocytes Absolute 1.2 (*)    All other components within normal limits  COMPREHENSIVE METABOLIC PANEL - Abnormal; Notable for the following:     Glucose, Bld 100 (*)    Creatinine, Ser 0.49 (*)    Total Protein 8.6 (*)    Total Bilirubin 1.7 (*)    All other components within normal limits  RETICULOCYTES - Abnormal; Notable for the following:    Retic Ct Pct 7.6 (*)    RBC. 3.29 (*)    Retic Count, Manual 250.0 (*)    All other components within normal limits  POC URINE PREG, ED    Imaging Review Dg Chest 2 View  02/25/2015   CLINICAL DATA:  Sickle cell pain crisis.  Mid chest pain.  EXAM: CHEST  2 VIEW  COMPARISON:  11/28/2014  FINDINGS: Prominent markings in the right lung base  are stable since prior study, likely scarring. Left lung clear. Heart is normal size. No effusions or acute airspace opacities. No acute bony abnormality.  IMPRESSION: No active cardiopulmonary disease.   Electronically Signed   By: Rolm Baptise M.D.   On: 02/25/2015 12:04     EKG Interpretation   Date/Time:  Tuesday February 25 2015 15:53:39 EDT Ventricular Rate:  111 PR Interval:  143 QRS Duration: 101 QT Interval:  345 QTC Calculation: 469 R Axis:   33 Text Interpretation:  Sinus tachycardia Otherwise within normal limits  Confirmed by POLLINA  MD, CHRISTOPHER [phone]) on 02/25/2015 4:02:12 PM      MDM   Final diagnoses:  Sickle cell anemia with pain  Intractable vomiting with nausea, vomiting of unspecified type   Filed Vitals:   02/25/15 1554  BP: 117/67  Pulse: 113  Temp: 98 F (36.7 C)  Resp: 21   Afebrile, NAD, non-toxic appearing, AAOx4.   I have reviewed nursing notes, vital signs, and all appropriate lab and imaging results for this patient.  Anemia stable. To kill site count noted. Chest x-ray without evidence of acute chest syndrome. EKG shows sinus tach. Patient given 5 mg total Dilaudid, 30 mg Toradol 1 mg Ativan 25 mg of Phenergan with little to no improvement of pain. Patient continuously with increased heart rates into the 120s and 130s with pain episodes or emesis. Given continued symptoms with nausea and vomiting  will admit patient for further symptom control for sickle cell anemia pain. Patient d/w with Dr. Zenia Resides, agrees with plan.      Baron Sane, PA-C 02/25/15 1611  Lacretia Leigh, MD 02/27/15 1729

## 2015-02-25 NOTE — ED Notes (Signed)
Bed: WTR5 Expected date:  Expected time:  Means of arrival:  Comments: EMS [facility]

## 2015-02-25 NOTE — Progress Notes (Signed)
ANTIBIOTIC CONSULT NOTE - INITIAL  Pharmacy Consult for Vancomycin and Cefepime Indication: HCAP  Allergies  Allergen Reactions  . Other Nausea Only    Patient stated that all iv pain medication make her have really bad nausea    Patient Measurements: Height: 5\' 3"  (160 cm) Weight: 189 lb 13.1 oz (86.1 kg) IBW/kg (Calculated) : 52.4  Vital Signs: Temp: 98.2 F (36.8 C) (04/26 2120) Temp Source: Oral (04/26 2120) BP: 99/67 mmHg (04/26 2120) Pulse Rate: 72 (04/26 2120) Intake/Output from previous day:   Intake/Output from this shift:    Labs:  Recent Labs  02/25/15 1022  WBC 8.9  HGB 9.7*  PLT 680*  CREATININE 0.49*   Estimated Creatinine Clearance: 114.8 mL/min (by C-G formula based on Cr of 0.49). No results for input(s): VANCOTROUGH, VANCOPEAK, VANCORANDOM, GENTTROUGH, GENTPEAK, GENTRANDOM, TOBRATROUGH, TOBRAPEAK, TOBRARND, AMIKACINPEAK, AMIKACINTROU, AMIKACIN in the last 72 hours.   Microbiology: No results found for this or any previous visit (from the past 720 hour(s)).  Medical History: Past Medical History  Diagnosis Date  . Sickle cell crisis   . Depression   . Headache(784.0)   . Abortion     05/2012    Medications:  Scheduled:  . aspirin EC  81 mg Oral Daily  . ceFEPime (MAXIPIME) IV  1 g Intravenous 3 times per day  . enoxaparin (LOVENOX) injection  40 mg Subcutaneous Q24H  . folic acid  1 mg Oral Daily  . HYDROmorphone PCA 0.3 mg/mL   Intravenous 6 times per day  . hydroxyurea  500 mg Oral Daily  . [START ON 02/26/2015] loratadine  10 mg Oral Daily  . senna-docusate  1 tablet Oral BID  . vancomycin  1,000 mg Intravenous Q8H   Infusions:  . sodium chloride 100 mL/hr at 02/25/15 1830   PRN: acetaminophen **OR** acetaminophen, iohexol, naloxone **AND** sodium chloride, ondansetron **OR** ondansetron (ZOFRAN) IV, polyethylene glycol  Assessment: 24 yo female with sickle cell disease recently discharged 3 days ago, who developed chest  tightness last night. CXR negative for infiltrates, symptoms concerning for thromboembolic etiology. Ddimer slightly elevated at 0.69, CTa neg for PE, but areas of pneumonia in the left lower lobe seen.  Pharmacy is consulted to dose vancomycin and cefepime for treatment of HCAP.   Goal of Therapy:  Vancomycin trough level 15-20 mcg/ml  Cefepime dose appropriate for indication and renal function  Plan:   Vancomycin 1g IV q8h Check trough at steady state Cefepime 1g IV q8h Follow up renal function & cultures (if ordered) Follow up clinical course, de-escalation as appropriate  Peggyann Juba, PharmD, BCPS Pager: [phone] 02/25/2015,9:25 PM

## 2015-02-25 NOTE — H&P (Signed)
[facility] History and Physical  Carmen [name]:[id number] DOB: 10/17/1991 DOA: 02/25/2015   PCP: [name], MD  Specialists: None  Chief Complaint: Chest, back, leg and arm pain since yesterday  HPI: Carmen [name] is a 24 y.o. female with a past medical history of sickle cell disease, presumed obstructive sleep apnea, who was discharged 3 days ago from the sickle cell service was in her usual state of health till last night when she started developing chest tightness. She described this as a sharp pain. Worse with coughing. 8/10 in intensity. She was able to sleep through the night. She had one episode of vomiting after arriving here in the ED, but none before that. She's had a cough with the clear expectoration and has seen blood occasionally. The pain is located in the entire chest without any radiation. She denies any dizziness or lightheadedness. Some shortness of breath. She's been congested recently and has had little bit of a runny nose. This morning she started having pain in her legs, back and arm as well. And then she decided to come into the hospital. Apparently patient was not able to fill her prescriptions for narcotics as she could not afford it.  Home Medications: Prior to Admission medications   Medication Sig Start Date End Date Taking? Authorizing Provider  aspirin EC 81 MG EC tablet Take 1 tablet (81 mg total) by mouth daily. 02/19/15  Yes Leana Gamer, MD  cetirizine (ZYRTEC) 10 MG tablet Take 10 mg by mouth daily.    Yes Historical Provider, MD  diphenhydramine-acetaminophen (TYLENOL PM) 25-500 MG TABS Take 2 tablets by mouth as needed (pain/sleep).    Yes Historical Provider, MD  folic acid (FOLVITE) 1 MG tablet Take 1 tablet (1 mg total) by mouth daily. 02/19/15  Yes Leana Gamer, MD  HYDROcodone-acetaminophen (NORCO) 10-325 MG per tablet Take 1 tablet by mouth every 6 (six) hours as needed for moderate pain or severe pain (pain). 02/22/15   Yes Olabunmi A Agboola, MD  hydroxyurea (HYDREA) 500 MG capsule Take 1 capsule (500 mg total) by mouth daily. May take with food to minimize GI side effects. 02/22/15  Yes Doy Hutching, MD    Allergies:  Allergies  Allergen Reactions  . Other Nausea Only    Patient stated that all iv pain medication make her have really bad nausea    Past Medical History: Past Medical History  Diagnosis Date  . Sickle cell crisis   . Depression   . Headache(784.0)   . Abortion     05/2012    Past Surgical History  Procedure Laterality Date  . Splenectomy    . Cholecystectomy N/A 11/30/2014    Procedure: LAPAROSCOPIC CHOLECYSTECTOMY SINGLE SITE WITH INTRAOPERATIVE CHOLANGIOGRAM;  Surgeon: Michael Boston, MD;  Location: [facility];  Service: General;  Laterality: N/A;    Social History: Lives with her mother and sister. Denies any smoking, alcohol use or recreational drug use. Usually independent with daily activities.  Family History:  Family History  Problem Relation Age of Onset  . Hypertension Mother   . Sickle cell anemia Sister   . Kidney disease Sister   . Arthritis Sister   . Sickle cell anemia Sister   . Sickle cell trait Sister   . Heart disease Maternal Aunt   . Heart disease Maternal Uncle      Review of Systems - History obtained from the patient General ROS: positive for  - fatigue Psychological ROS: negative Ophthalmic ROS:  negative ENT ROS: positive for - nasal congestion Allergy and Immunology ROS: negative Hematological and Lymphatic ROS: negative Endocrine ROS: negative Respiratory ROS: as in hpi Cardiovascular ROS: as in hpi Gastrointestinal ROS: no abdominal pain, change in bowel habits, or black or bloody stools Genito-Urinary ROS: no dysuria, trouble voiding, or hematuria Musculoskeletal ROS: negative Neurological ROS: no TIA or stroke symptoms Dermatological ROS: negative  Physical Examination  Filed Vitals:   02/25/15 0917 02/25/15 0930 02/25/15 1400  02/25/15 1554  BP:  123/78 106/61 117/67  Pulse:  90 105 113  Temp:  98.1 F (36.7 C)  98 F (36.7 C)  TempSrc:  Oral  Oral  Resp:  _0 SpO2: 98% 99% 98% 96%    BP 117/67 mmHg  Pulse 113  Temp(Src) 98 F (36.7 C) (Oral)  Resp 21  SpO2 96%  LMP 02/13/2015 (Exact Date)  General appearance: alert, cooperative, appears stated age and no distress Head: Normocephalic, without obvious abnormality, atraumatic Eyes: conjunctivae/corneas clear. PERRL, EOM's intact.  Throat: lips, mucosa, and tongue normal; teeth and gums normal Neck: no adenopathy, no carotid bruit, no JVD, supple, symmetrical, trachea midline and thyroid not enlarged, symmetric, no tenderness/mass/nodules Resp: clear to auscultation bilaterally Cardio: S1, S2 is tachycardic. Regular. No S3, S4. No rubs, murmurs, or bruit. No pedal edema. Chest pain wasn't reproducible by palpation of the chest wall GI: soft, non-tender; bowel sounds normal; no masses,  no organomegaly Extremities: extremities normal, atraumatic, no cyanosis or edema Pulses: 2+ and symmetric Skin: Skin color, texture, turgor normal. No rashes or lesions Lymph nodes: Cervical, supraclavicular, and axillary nodes normal. Neurologic: Sleepy due to medications but easily arousable. No focal deficits.  Laboratory Data: Results for orders placed or performed during the hospital encounter of 02/25/15 (from the past 48 hour(s))  POC urine preg, ED (not at [facility])     Status: None   Collection Time: 02/25/15 10:10 AM  Result Value Ref Range   Preg Test, Ur NEGATIVE NEGATIVE    Comment:        THE SENSITIVITY OF THIS METHODOLOGY IS >24 mIU/mL   CBC WITH DIFFERENTIAL     Status: Abnormal   Collection Time: 02/25/15 10:22 AM  Result Value Ref Range   WBC 8.9 4.0 - 10.5 K/uL   RBC 3.29 (L) 3.87 - 5.11 MIL/uL   Hemoglobin 9.7 (L) 12.0 - 15.0 g/dL   HCT 28.0 (L) 36.0 - 46.0 %   MCV 85.1 78.0 - 100.0 fL   MCH 29.5 26.0 - 34.0 pg   MCHC 34.6 30.0 - 36.0  g/dL   RDW 15.2 11.5 - 15.5 %   Platelets 680 (H) 150 - 400 K/uL   Neutrophils Relative % 55 43 - 77 %   Neutro Abs 4.9 1.7 - 7.7 K/uL   Lymphocytes Relative 31 12 - 46 %   Lymphs Abs 2.8 0.7 - 4.0 K/uL   Monocytes Relative 13 (H) 3 - 12 %   Monocytes Absolute 1.2 (H) 0.1 - 1.0 K/uL   Eosinophils Relative 1 0 - 5 %   Eosinophils Absolute 0.1 0.0 - 0.7 K/uL   Basophils Relative 0 0 - 1 %   Basophils Absolute 0.0 0.0 - 0.1 K/uL  Comprehensive metabolic panel     Status: Abnormal   Collection Time: 02/25/15 10:22 AM  Result Value Ref Range   Sodium 138 135 - 145 mmol/L   Potassium 3.6 3.5 - 5.1 mmol/L   Chloride 108 96 - 112 mmol/L  CO2 23 19 - 32 mmol/L   Glucose, Bld 100 (H) 70 - 99 mg/dL   BUN 8 6 - 23 mg/dL   Creatinine, Ser 0.49 (L) 0.50 - 1.10 mg/dL   Calcium 9.7 8.4 - 10.5 mg/dL   Total Protein 8.6 (H) 6.0 - 8.3 g/dL   Albumin 4.7 3.5 - 5.2 g/dL   AST 27 0 - 37 U/L   ALT 30 0 - 35 U/L   Alkaline Phosphatase 56 39 - 117 U/L   Total Bilirubin 1.7 (H) 0.3 - 1.2 mg/dL   GFR calc non Af Amer >90 >90 mL/min   GFR calc Af Amer >90 >90 mL/min    Comment: (NOTE) The eGFR has been calculated using the CKD EPI equation. This calculation has not been validated in all clinical situations. eGFR's persistently <90 mL/min signify possible Chronic Kidney Disease.    Anion gap 7 5 - 15  Reticulocytes     Status: Abnormal   Collection Time: 02/25/15 10:22 AM  Result Value Ref Range   Retic Ct Pct 7.6 (H) 0.4 - 3.1 %   RBC. 3.29 (L) 3.87 - 5.11 MIL/uL   Retic Count, Manual 250.0 (H) 19.0 - 186.0 K/uL    Radiology Reports: Dg Chest 2 View  02/25/2015   CLINICAL DATA:  Sickle cell pain crisis.  Mid chest pain.  EXAM: CHEST  2 VIEW  COMPARISON:  11/28/2014  FINDINGS: Prominent markings in the right lung base are stable since prior study, likely scarring. Left lung clear. Heart is normal size. No effusions or acute airspace opacities. No acute bony abnormality.  IMPRESSION: No active  cardiopulmonary disease.   Electronically Signed   By: Rolm Baptise M.D.   On: 02/25/2015 12:04    Electrocardiogram: Sinus tachycardia at 111 beats a minute. Normal axis. Intervals are normal. No concerning ST or T-wave changes.  Problem List  Principal Problem:   Sickle cell anemia with pain Active Problems:   Sickle cell pain crisis   Atypical chest pain   Assessment: This is a 24 year old African American female with sickle cell disease who comes in with chest pain, leg pain, arm pain and back pain. Most of her symptoms are suggestive of sickle cell crises related pain. Chest pain is atypical for cardiac etiology. But considering the pleuritic nature, tachycardic and hemoptysis, it is concerning for thromboembolic etiology.  Plan: #1 Sickle cell crises related pain: She'll be admitted to the hospital. She'll be given IV fluids. PCA will be initiated. Continue the home medications including hydroxyurea and folic acid. She will monitor on telemetry. Continuous pulse oximetry. She is noted to be anemic, but hemoglobin is close to baseline. She's also noted to have thrombocytosis which was also seen previously.  #2 chest pain: Atypical for cardiac etiology. Somewhat concerning for thromboembolic etiology. We will get a d-dimer and then pursue further testing based on the result of the same. Chest x-ray does not show any infiltrates, so acute chest syndrome is less likely.  #3 Presumed obstructive sleep apnea: Apparently, she was noted to have apneic spells while she was hospitalized recently. She's never had formal testing. She knows to pursue this as an outpatient. Continue to monitor closely.   DVT Prophylaxis: Lovenox Code Status: Full code Family Communication: Discussed with the patient  Disposition Plan: Admit to telemetry   Further management decisions will depend on results of further testing and patient's response to treatment.   [facility] Pager  [phone]  If 7PM-7AM, please  contact night-coverage [email or web address] Password [facility]  02/25/2015, 4:36 PM

## 2015-02-25 NOTE — ED Notes (Addendum)
Per EMS, Patient c/o SCC. Was d/c from [facility] on Saturday, given RX for pain meds. Patient states she was not able to get her medications filled because she does not have insurance. Patient states her pain is increasing since discharge. Patient c/o chest and BUE pain. Ambulatory with EMS.  Patient states she was given script for Vicodin and it was $15 and she was not able to afford it. Patient states she is not having chest pain, but is having "chest tightness". Patient states she is also having pain in all extremities and low back pain. Rated 9/10.

## 2015-02-25 NOTE — Progress Notes (Signed)
Attempted to call to receive report on patient, RN not available at time.
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
## 2015-02-25 NOTE — ED Notes (Signed)
Attempted to call report, RN unavailable.

## 2015-02-26 DIAGNOSIS — D5700 Hb-SS disease with crisis, unspecified: Principal | ICD-10-CM

## 2015-02-26 LAB — COMPREHENSIVE METABOLIC PANEL
ALBUMIN: 3.9 g/dL (ref 3.5–5.2)
ALT: 23 U/L (ref 0–35)
AST: 22 U/L (ref 0–37)
Alkaline Phosphatase: 49 U/L (ref 39–117)
Anion gap: 9 (ref 5–15)
BUN: 6 mg/dL (ref 6–23)
CALCIUM: 9.3 mg/dL (ref 8.4–10.5)
CO2: 23 mmol/L (ref 19–32)
Chloride: 107 mmol/L (ref 96–112)
Creatinine, Ser: 0.38 mg/dL — ABNORMAL LOW (ref 0.50–1.10)
GFR calc Af Amer: >90 mL/min (ref >90)
Glucose, Bld: 86 mg/dL (ref 70–99)
Potassium: 3.6 mmol/L (ref 3.5–5.1)
Sodium: 139 mmol/L (ref 135–145)
Total Bilirubin: 1.2 mg/dL (ref 0.3–1.2)
Total Protein: 7.3 g/dL (ref 6.0–8.3)

## 2015-02-26 LAB — CBC WITH DIFFERENTIAL/PLATELET
BASOS PCT: 0 % (ref 0–1)
Basophils Absolute: 0 10*3/uL (ref 0.0–0.1)
EOS PCT: 1 % (ref 0–5)
Eosinophils Absolute: 0.1 10*3/uL (ref 0.0–0.7)
HCT: 25.9 % — ABNORMAL LOW (ref 36.0–46.0)
HEMOGLOBIN: 9.1 g/dL — AB (ref 12.0–15.0)
LYMPHS ABS: 7.1 10*3/uL — AB (ref 0.7–4.0)
Lymphocytes Relative: 65 % — ABNORMAL HIGH (ref 12–46)
MCH: 29.9 pg (ref 26.0–34.0)
MCHC: 35.1 g/dL (ref 30.0–36.0)
MCV: 85.2 fL (ref 78.0–100.0)
MONO ABS: 1.3 10*3/uL — AB (ref 0.1–1.0)
Monocytes Relative: 12 % (ref 3–12)
Neutro Abs: 2.4 10*3/uL (ref 1.7–7.7)
Neutrophils Relative %: 22 % — ABNORMAL LOW (ref 43–77)
Platelets: 636 10*3/uL — ABNORMAL HIGH (ref 150–400)
RBC: 3.04 MIL/uL — AB (ref 3.87–5.11)
RDW: 14.8 % (ref 11.5–15.5)
WBC: 10.9 10*3/uL — ABNORMAL HIGH (ref 4.0–10.5)

## 2015-02-26 LAB — RETICULOCYTES
RBC.: 3.04 MIL/uL — ABNORMAL LOW (ref 3.87–5.11)
RETIC CT PCT: 7.7 % — AB (ref 0.4–3.1)
Retic Count, Absolute: 234.1 10*3/uL — ABNORMAL HIGH (ref 19.0–186.0)

## 2015-02-26 LAB — MAGNESIUM: Magnesium: 1.8 mg/dL (ref 1.5–2.5)

## 2015-02-26 MED ORDER — LEVOFLOXACIN 750 MG PO TABS
750.0000 mg | ORAL_TABLET | Freq: Every day | ORAL | Status: DC
  Administered 2015-02-26 – 2015-02-27 (×2): 750 mg via ORAL
  Filled 2015-02-26 (×2): qty 1

## 2015-02-26 MED ORDER — HYDROMORPHONE 0.3 MG/ML IV SOLN
INTRAVENOUS | Status: DC
  Administered 2015-02-26: 3.49 mg via INTRAVENOUS
  Administered 2015-02-26: 21:00:00 via INTRAVENOUS
  Administered 2015-02-27: 2.69 mg via INTRAVENOUS
  Administered 2015-02-27: 1.94 mg via INTRAVENOUS
  Administered 2015-02-27: 0.999 mg via INTRAVENOUS
  Administered 2015-02-27: 0.99 mg via INTRAVENOUS
  Filled 2015-02-26: qty 25

## 2015-02-26 MED ORDER — DIPHENHYDRAMINE HCL 25 MG PO CAPS: 25.0000 mg | ORAL_CAPSULE | Freq: Four times a day (QID) | ORAL | Status: DC | PRN

## 2015-02-26 MED ORDER — OXYCODONE HCL 5 MG PO TABS
5.0000 mg | ORAL_TABLET | ORAL | Status: DC
  Administered 2015-02-26 – 2015-02-27 (×6): 5 mg via ORAL
  Filled 2015-02-26 (×6): qty 1

## 2015-02-26 MED ORDER — OXYCODONE-ACETAMINOPHEN 5-325 MG PO TABS
1.0000 | ORAL_TABLET | ORAL | Status: DC
  Administered 2015-02-26 – 2015-02-27 (×6): 1 via ORAL
  Filled 2015-02-26 (×6): qty 1

## 2015-02-26 MED ORDER — ONDANSETRON HCL 4 MG/2ML IJ SOLN: 4.0000 mg | Freq: Four times a day (QID) | INTRAMUSCULAR | Status: DC | PRN

## 2015-02-26 MED ORDER — NALOXONE HCL 0.4 MG/ML IJ SOLN: 0.4000 mg | INTRAMUSCULAR | Status: DC | PRN

## 2015-02-26 MED ORDER — SODIUM CHLORIDE 0.9 % IJ SOLN: 9.0000 mL | INTRAMUSCULAR | Status: DC | PRN

## 2015-02-26 NOTE — Progress Notes (Signed)
SICKLE CELL SERVICE PROGRESS NOTE  Carmen [name] DJT:[id number] DOB: Oct 03, 1991 DOA: 02/25/2015 PCP: [name], MD  Assessment/Plan: Principal Problem:   Sickle cell anemia with pain Active Problems:   Sickle cell pain crisis   Atypical chest pain  1. Pneumonia: Pt was started on Cefepime and Vancomycin for HCAP. However she has had no fevers and the severity of illness is mild. Will de-escalate antibiotics to Levaquin 750 mg daily. If she continues to defervesce I will plan on discharge for tomorrow. 2. 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 Currently her PCA is set such that it locks out after only 4 doses. I have adjusted PCA to allow for a full 1 hour dosing and kept her bolus at 0.5 mg. I will also schedule her oral percocet and she will use the PCA as PRN medication. Continue Toradol and IVF. Continue patient on Hydrea 500 mg as she will be following up with me in the office to follow up on CBC and reticulocyte count. 3. Leukocytosis: Pt has a mild leukocytosis which is likely related to early pneumonia.  Will continue to monitor. 4. Anemia of chronic disease: Per patients report Hb stable. 5. Thrombocytosis: I suspect that this is related to her Hb SS. I anticipate that she will have a decrease in platelet count once she is on Hydrea. However in the mean timw I will start on a daily ASA 81 mg and re-assess in the out patient setting.   Code Status: Full Code Family Communication: N/A Disposition Plan: Anticipate discharge tomorrow.  Zhuri Krass A.  Pager [phone]. If 7PM-7AM, please contact night-coverage.  02/26/2015, 12:08 PM  LOS: 1 day    Consultants:  None  Procedures:  None  Antibiotics:  Cefepime 4/26 >>4/27  Vancomycin 4/26>>4/27  Levaquin 4/27 >>  HPI/Subjective: Pt reports that she continued to have mild pain after discharge for which she was prescribed analgesics but  was unable to afford the medication. She tried to manage with Tylenol and Ibuprofen but it escalated to the point where she had to come back to the ED. Pt states that she usually has to pay about $50 for her prescriptions which indicates a flaw in the process as she has medicaid. Will ask Case Management to follow up.   Objective: Filed Vitals:   02/26/15 0500 02/26/15 0746 02/26/15 1000 02/26/15 1003  BP: 107/63  108/75   Pulse: 79  88   Temp: 98.9 F (37.2 C)  98.4 F (36.9 C)   TempSrc: Oral  Oral   Resp: 20 18 16 13   Height:      Weight:      SpO2: 99% 100% 100% 99%   Weight change:   Intake/Output Summary (Last 24 hours) at 02/26/15 1208 Last data filed at 02/26/15 0800  Gross per 24 hour  Intake 2681.66 ml  Output      0 ml  Net 2681.66 ml    General: Alert, awake, oriented x3, in no acute distress.  HEENT: /AT PEERL, EOMI, anicteric OROPHARYNX:  Moist, No exudate/ erythema/lesions.  Heart: Regular rate and rhythm, without murmurs, rubs, gallops, PMI non-displaced, no heaves or thrills on palpation.  Lungs: Clear to auscultation, no wheezing or rhonchi noted. No increased vocal fremitus resonant to percussion  Abdomen: Soft, nontender, nondistended, positive bowel sounds, no masses no hepatosplenomegaly noted.  Neuro: No focal neurological deficits noted cranial nerves II through XII grossly  intact.  Strength functional in bilateral upper and lower extremities. Musculoskeletal: No warm swelling or erythema around joints, no spinal tenderness noted. Psychiatric: Patient alert and oriented x3, good insight and cognition, good recent to remote recall. Lymph node survey: No cervical axillary or inguinal lymphadenopathy noted.   Data Reviewed: Basic Metabolic Panel:  Recent Labs Lab 02/21/15 0445 02/25/15 1022 02/26/15 0540  NA 136 138 139  K 4.0 3.6 3.6  CL 104 108 107  CO2 26 23 23   GLUCOSE 91 100* 86  BUN 6 8 6   CREATININE 0.43* 0.49* 0.38*  CALCIUM 8.8 9.7  9.3  MG  --   --  1.8   Liver Function Tests:  Recent Labs Lab 02/21/15 0445 02/25/15 1022 02/26/15 0540  AST 24 27 22   ALT 20 30 23   ALKPHOS 56 56 49  BILITOT 1.1 1.7* 1.2  PROT 6.9 8.6* 7.3  ALBUMIN 3.7 4.7 3.9   No results for input(s): LIPASE, AMYLASE in the last 168 hours. No results for input(s): AMMONIA in the last 168 hours. CBC:  Recent Labs Lab 02/21/15 0445 02/22/15 0415 02/25/15 1022 02/26/15 0540  WBC 13.6*  --  8.9 10.9*  NEUTROABS 6.0  --  4.9 2.4  HGB 8.7* 7.9* 9.7* 9.1*  HCT 24.8* 22.5* 28.0* 25.9*  MCV 84.9  --  85.1 85.2  PLT 665*  --  680* 636*   Cardiac Enzymes: No results for input(s): CKTOTAL, CKMB, CKMBINDEX, TROPONINI in the last 168 hours. BNP (last 3 results) No results for input(s): BNP in the last 8760 hours.  ProBNP (last 3 results) No results for input(s): PROBNP in the last 8760 hours.  CBG: No results for input(s): GLUCAP in the last 168 hours.  No results found for this or any previous visit (from the past 240 hour(s)).   Studies: Dg Chest 2 View  02/25/2015   CLINICAL DATA:  Sickle cell pain crisis.  Mid chest pain.  EXAM: CHEST  2 VIEW  COMPARISON:  11/28/2014  FINDINGS: Prominent markings in the right lung base are stable since prior study, likely scarring. Left lung clear. Heart is normal size. No effusions or acute airspace opacities. No acute bony abnormality.  IMPRESSION: No active cardiopulmonary disease.   Electronically Signed   By: Rolm Baptise M.D.   On: 02/25/2015 12:04   Ct Angio Chest Pe W/cm &/or Wo Cm  02/25/2015   CLINICAL DATA:  Two day history of chest tightness and shortness of breath. Sickle cell disease.  EXAM: CT ANGIOGRAPHY CHEST WITH CONTRAST  TECHNIQUE: Multidetector CT imaging of the chest was performed using the standard protocol during bolus administration of intravenous contrast. Multiplanar CT image reconstructions and MIPs were obtained to evaluate the vascular anatomy.  CONTRAST:  100 mL Omnipaque  350 nonionic  COMPARISON:  Chest CT February 03, 2014; chest radiograph February 25, 2015  FINDINGS: There is no demonstrable pulmonary embolus. There is no thoracic aortic aneurysm or dissection.  Several focal areas of opacity are noted in the superior segment of the left lower lobe consistent with multifocal pneumonia. A small area of pneumonia is also noted in the posterior segment of the left lower lobe. There is patchy atelectasis in both lung bases, slightly more on the right than on the left. Mild atelectatic changes also noted in the inferior aspect of the right middle lobe and lingula. A small amount of ill-defined opacity is noted in the posterior segment right upper lobe consistent with either atelectasis or the earliest  changes of pneumonia in this area.  There is residual thymic tissue, normal for age. There is no appreciable thoracic adenopathy. The pericardium is not thickened. Thyroid appears unremarkable.  In the visualized upper abdomen, there is evidence of previous splenic infarct. There is hepatic steatosis.  Bony structures show evidence of sickle cell disease with areas of thoracic end plate infarcts as well as patchy areas of sclerotic change.  Review of the MIP images confirms the above findings.  IMPRESSION: Areas of pneumonia in the left lower lobe, primarily in the superior segment. Suspect early pneumonia in the posterior segment of the right upper lobe. There are areas of patchy atelectasis as outlined above.  No demonstrable pulmonary embolus.  Bony changes of sickle cell disease. Status post splenic infarct. Hepatic steatosis present.   Electronically Signed   By: Lowella Grip III M.D.   On: 02/25/2015 20:04    Scheduled Meds: . aspirin EC  81 mg Oral Daily  . enoxaparin (LOVENOX) injection  40 mg Subcutaneous Q24H  . folic acid  1 mg Oral Daily  . HYDROmorphone PCA 0.3 mg/mL   Intravenous 6 times per day  . hydroxyurea  500 mg Oral Daily  . levofloxacin  750 mg Oral Daily  .  loratadine  10 mg Oral Daily  . oxyCODONE-acetaminophen  1 tablet Oral Q4H   And  . oxyCODONE  5 mg Oral Q4H  . senna-docusate  1 tablet Oral BID   Continuous Infusions: . sodium chloride 100 mL/hr at 02/25/15 1830    Time spent 50 minutes.

## 2015-02-26 NOTE — Progress Notes (Signed)
Patient is in bed sleeping at this time, no distress noted. Responds to voice.  PCA use last 12 hours:  Total: 3.9mg  Demand: 13 Received:13

## 2015-02-26 NOTE — Progress Notes (Signed)
Nutrition Brief Note  Patient identified on the Malnutrition Screening Tool (MST) Report  Wt Readings from Last 15 Encounters:  02/25/15 189 lb 13.1 oz (86.1 kg)  02/21/15 184 lb 4.8 oz (83.598 kg)  05/16/14 198 lb 6.6 oz (90 kg)  03/28/14 186 lb 12.8 oz (84.732 kg)  01/29/14 135 lb (61.236 kg)  11/12/13 183 lb 9.6 oz (83.28 kg)  11/10/13 170 lb (77.111 kg)  11/02/13 160 lb (72.576 kg)  09/03/13 170 lb (77.111 kg)  06/12/13 195 lb (88.451 kg)  06/10/13 195 lb (88.451 kg)  09/22/12 205 lb (92.987 kg) (98 %*, Z = 2.02)  10/13/12 190 lb (86.183 kg) (96 %*, Z = 1.78)  10/08/12 180 lb (81.647 kg) (95 %*, Z = 1.60)  09/14/12 170 lb (77.111 kg) (92 %*, Z = 1.39)   * Growth percentiles are based on CDC 2-20 Years data.    Body mass index is 33.63 kg/(m^2). Patient meets criteria for obesity based on current BMI.   Current diet order is Regular, patient is consuming approximately 100% of meals at this time. Labs and medications reviewed.   Pt reports that PTA she had no recent weight changes. She states that she was having pain related to sickle cell crisis and would force herself to eat as she did not feel hungry, but that she still made sure to eat something at each meal.  No nutrition interventions warranted at this time. If nutrition issues arise, please consult RD.     Jarome Matin, RD, LDN Inpatient Clinical Dietitian Pager # [phone] After hours/weekend pager # [phone]

## 2015-02-26 NOTE — Progress Notes (Signed)
CARE MANAGEMENT NOTE 02/26/2015  Patient:  Carmen [name], Carmen [name]   Account Number:  [id number]  Date Initiated:  02/26/2015  Documentation initiated by:  Edwyna Shell  Subjective/Objective Assessment:   24 yo female admitted with SCC from home     Action/Plan:   discharge planning   Anticipated DC Date:  02/27/2015   Anticipated DC Plan:  [place]  CM consult      Choice offered to / List presented to:             Status of service:  In process, will continue to follow Medicare Important Message given?   (If response is "NO", the following Medicare IM given date fields will be blank) Date Medicare IM given:   Medicare IM given by:   Date Additional Medicare IM given:   Additional Medicare IM given by:    Discharge Disposition:  HOME/SELF CARE  Per UR Regulation:    If discussed at Long Length of Stay Meetings, dates discussed:    Comments:  02/26/15 Leanne Chang RN BSN CM [phone] Received consult to assist with medication affordability. Patient stated that she is a member of the [facility] program with [employer] and has a card but in order for her care and medications to be covered her PCP has to authorize the card and she is having difficulty with her PCP authorizing her card. She stated that she had to pay $50.00 last time for her Vicodin from [facility] on [address]. This CM contacted the [place] and left a message for her counselor Margarite at [phone]. Also discussed the [employer] and provided the patient with coupons for Vicodin and at [facility] the cost is $10 for 30 days and $16 for 60 days. The patient stated that she can afford those prices and was appreciative of the coupons and resources. Will continue to follow.

## 2015-02-27 ENCOUNTER — Ambulatory Visit: Payer: Medicaid Other | Admitting: Internal Medicine

## 2015-02-27 MED ORDER — LEVOFLOXACIN 750 MG PO TABS: 750.0000 mg | ORAL_TABLET | Freq: Every day | ORAL | Status: DC

## 2015-02-27 NOTE — Discharge Summary (Signed)
Physician Discharge Summary  Patient ID: Carmen [name] MRN: [id number] DOB/AGE: [birth year] 24 y.o.  Admit date: 02/25/2015 Discharge date: 02/27/2015  Admission Diagnoses:  Discharge Diagnoses:  Principal Problem:   Sickle cell anemia with pain Active Problems:   Sickle cell pain crisis   Atypical chest pain   Discharged Condition: stable  Hospital Course: Patient was admitted with sickle cell painful crisis. She was undergoing treatment with antibiotics is also for atypical infection possibly pneumonia based on the chest x-ray. She left before she completed treatment because she has something to do personally. She basically left AGAINST MEDICAL ADVICE.  Consults: None  Significant Diagnostic Studies: labs: Serial CBCs and CMP is checked. Chest x-ray showed possible pneumonia  Treatments: IV hydration, antibiotics: Levaquin and analgesia: acetaminophen and Dilaudid  Discharge Exam: Blood pressure 129/61, pulse 75, temperature 98.1 F (36.7 C), temperature source Oral, resp. rate 10, height 5\' 3"  (1.6 m), weight 86.1 kg (189 lb 13.1 oz), last menstrual period 02/13/2015, SpO2 100 %. General appearance: alert and cooperative Eyes: conjunctivae/corneas clear. PERRL, EOM's intact. Fundi benign. Back: symmetric, no curvature. ROM normal. No CVA tenderness. Resp: clear to auscultation bilaterally Neurologic: Grossly normal  Disposition: 01-Home or Self Care     Medication List    TAKE these medications        aspirin 81 MG EC tablet  Take 1 tablet (81 mg total) by mouth daily.     cetirizine 10 MG tablet  Commonly known as:  ZYRTEC  Take 10 mg by mouth daily.     diphenhydramine-acetaminophen 25-500 MG Tabs  Commonly known as:  TYLENOL PM  Take 2 tablets by mouth as needed (pain/sleep).     folic acid 1 MG tablet  Commonly known as:  FOLVITE  Take 1 tablet (1 mg total) by mouth daily.     HYDROcodone-acetaminophen 10-325 MG per tablet  Commonly known as:  NORCO   Take 1 tablet by mouth every 6 (six) hours as needed for moderate pain or severe pain (pain).     hydroxyurea 500 MG capsule  Commonly known as:  HYDREA  Take 1 capsule (500 mg total) by mouth daily. May take with food to minimize GI side effects.     levofloxacin 750 MG tablet  Commonly known as:  LEVAQUIN  Take 1 tablet (750 mg total) by mouth daily.         SignedBarbette Merino 02/27/2015, 8:59 AM

## 2015-02-28 ENCOUNTER — Inpatient Hospital Stay (HOSPITAL_COMMUNITY)
Admission: EM | Admit: 2015-02-28 | Discharge: 2015-03-06 | DRG: 193 | Disposition: A | Discharge status: Home / Self Care | Payer: Medicaid Other | Attending: Internal Medicine | Admitting: Internal Medicine

## 2015-02-28 ENCOUNTER — Emergency Department (HOSPITAL_COMMUNITY): Payer: Medicaid Other

## 2015-02-28 ENCOUNTER — Encounter (HOSPITAL_COMMUNITY): Payer: Self-pay | Admitting: Emergency Medicine

## 2015-02-28 DIAGNOSIS — Z880 Allergy status to penicillin: Secondary | ICD-10-CM | POA: Diagnosis not present

## 2015-02-28 DIAGNOSIS — Z7982 Long term (current) use of aspirin: Secondary | ICD-10-CM

## 2015-02-28 DIAGNOSIS — D5700 Hb-SS disease with crisis, unspecified: Secondary | ICD-10-CM | POA: Diagnosis not present

## 2015-02-28 DIAGNOSIS — D638 Anemia in other chronic diseases classified elsewhere: Secondary | ICD-10-CM | POA: Diagnosis present

## 2015-02-28 DIAGNOSIS — R0789 Other chest pain: Secondary | ICD-10-CM

## 2015-02-28 DIAGNOSIS — D473 Essential (hemorrhagic) thrombocythemia: Secondary | ICD-10-CM | POA: Diagnosis present

## 2015-02-28 DIAGNOSIS — J189 Pneumonia, unspecified organism: Secondary | ICD-10-CM | POA: Diagnosis not present

## 2015-02-28 DIAGNOSIS — Z9114 Patient's other noncompliance with medication regimen: Secondary | ICD-10-CM | POA: Diagnosis present

## 2015-02-28 DIAGNOSIS — F329 Major depressive disorder, single episode, unspecified: Secondary | ICD-10-CM | POA: Diagnosis present

## 2015-02-28 DIAGNOSIS — Y95 Nosocomial condition: Secondary | ICD-10-CM | POA: Diagnosis present

## 2015-02-28 DIAGNOSIS — Z9119 Patient's noncompliance with other medical treatment and regimen: Secondary | ICD-10-CM | POA: Diagnosis present

## 2015-02-28 DIAGNOSIS — D72829 Elevated white blood cell count, unspecified: Secondary | ICD-10-CM | POA: Diagnosis not present

## 2015-02-28 LAB — COMPREHENSIVE METABOLIC PANEL
ALK PHOS: 59 U/L (ref 39–117)
ALT: 29 U/L (ref 0–35)
AST: 29 U/L (ref 0–37)
Albumin: 4.6 g/dL (ref 3.5–5.2)
Anion gap: 11 (ref 5–15)
BUN: 12 mg/dL (ref 6–23)
CHLORIDE: 107 mmol/L (ref 96–112)
CO2: 23 mmol/L (ref 19–32)
Calcium: 9.7 mg/dL (ref 8.4–10.5)
Creatinine, Ser: 0.58 mg/dL (ref 0.50–1.10)
GFR calc non Af Amer: >90 mL/min (ref >90)
Glucose, Bld: 83 mg/dL (ref 70–99)
Potassium: 3.6 mmol/L (ref 3.5–5.1)
Sodium: 141 mmol/L (ref 135–145)
Total Bilirubin: 2.1 mg/dL — ABNORMAL HIGH (ref 0.3–1.2)
Total Protein: 8.4 g/dL — ABNORMAL HIGH (ref 6.0–8.3)

## 2015-02-28 LAB — CBC WITH DIFFERENTIAL/PLATELET
Basophils Absolute: 0 10*3/uL (ref 0.0–0.1)
Basophils Relative: 0 % (ref 0–1)
Eosinophils Absolute: 0.1 10*3/uL (ref 0.0–0.7)
Eosinophils Relative: 1 % (ref 0–5)
HEMATOCRIT: 26.3 % — AB (ref 36.0–46.0)
Hemoglobin: 9.2 g/dL — ABNORMAL LOW (ref 12.0–15.0)
LYMPHS ABS: 4.3 10*3/uL — AB (ref 0.7–4.0)
Lymphocytes Relative: 30 % (ref 12–46)
MCH: 29.6 pg (ref 26.0–34.0)
MCHC: 35 g/dL (ref 30.0–36.0)
MCV: 84.6 fL (ref 78.0–100.0)
MONO ABS: 1.2 10*3/uL — AB (ref 0.1–1.0)
Monocytes Relative: 9 % (ref 3–12)
NEUTROS PCT: 60 % (ref 43–77)
Neutro Abs: 8.8 10*3/uL — ABNORMAL HIGH (ref 1.7–7.7)
Platelets: 646 10*3/uL — ABNORMAL HIGH (ref 150–400)
RBC: 3.11 MIL/uL — ABNORMAL LOW (ref 3.87–5.11)
RDW: 15.2 % (ref 11.5–15.5)
WBC: 14.4 10*3/uL — ABNORMAL HIGH (ref 4.0–10.5)

## 2015-02-28 LAB — RETICULOCYTES
RBC.: 3.11 MIL/uL — AB (ref 3.87–5.11)
Retic Count, Absolute: 152.4 10*3/uL (ref 19.0–186.0)
Retic Ct Pct: 4.9 % — ABNORMAL HIGH (ref 0.4–3.1)

## 2015-02-28 LAB — POC URINE PREG, ED: Preg Test, Ur: NEGATIVE

## 2015-02-28 MED ORDER — LEVOFLOXACIN 750 MG PO TABS
750.0000 mg | ORAL_TABLET | Freq: Every day | ORAL | Status: DC
  Administered 2015-03-01 – 2015-03-06 (×6): 750 mg via ORAL
  Filled 2015-02-28 (×6): qty 1

## 2015-02-28 MED ORDER — DIPHENHYDRAMINE HCL 12.5 MG/5ML PO ELIX: 12.5000 mg | ORAL_SOLUTION | Freq: Four times a day (QID) | ORAL | Status: DC | PRN

## 2015-02-28 MED ORDER — LORATADINE 10 MG PO TABS
10.0000 mg | ORAL_TABLET | Freq: Every day | ORAL | Status: DC
  Administered 2015-03-01 – 2015-03-06 (×6): 10 mg via ORAL
  Filled 2015-02-28 (×6): qty 1

## 2015-02-28 MED ORDER — HYDROMORPHONE HCL 2 MG/ML IJ SOLN
2.0000 mg | Freq: Once | INTRAMUSCULAR | Status: AC
  Administered 2015-02-28: 2 mg via INTRAVENOUS
  Filled 2015-02-28: qty 1

## 2015-02-28 MED ORDER — ONDANSETRON HCL 4 MG/2ML IJ SOLN
4.0000 mg | INTRAMUSCULAR | Status: DC | PRN
  Administered 2015-03-02 – 2015-03-05 (×6): 4 mg via INTRAVENOUS
  Filled 2015-02-28 (×6): qty 2

## 2015-02-28 MED ORDER — HYDROMORPHONE HCL 2 MG/ML IJ SOLN
2.0000 mg | Freq: Once | INTRAMUSCULAR | Status: AC
Start: 2015-02-28 — End: 2015-02-28
  Administered 2015-02-28: 2 mg via INTRAVENOUS
  Filled 2015-02-28: qty 1

## 2015-02-28 MED ORDER — FOLIC ACID 1 MG PO TABS
1.0000 mg | ORAL_TABLET | Freq: Every day | ORAL | Status: DC
  Administered 2015-02-28 – 2015-03-06 (×7): 1 mg via ORAL
  Filled 2015-02-28 (×7): qty 1

## 2015-02-28 MED ORDER — ASPIRIN EC 81 MG PO TBEC
81.0000 mg | DELAYED_RELEASE_TABLET | Freq: Every day | ORAL | Status: DC
  Administered 2015-02-28 – 2015-03-06 (×7): 81 mg via ORAL
  Filled 2015-02-28 (×7): qty 1

## 2015-02-28 MED ORDER — KETOROLAC TROMETHAMINE 30 MG/ML IJ SOLN
30.0000 mg | Freq: Once | INTRAMUSCULAR | Status: AC
Start: 2015-02-28 — End: 2015-02-28
  Administered 2015-02-28: 30 mg via INTRAVENOUS
  Filled 2015-02-28: qty 1

## 2015-02-28 MED ORDER — HYDROCODONE-ACETAMINOPHEN 10-325 MG PO TABS
1.0000 | ORAL_TABLET | Freq: Four times a day (QID) | ORAL | Status: DC | PRN
  Administered 2015-03-01 – 2015-03-03 (×7): 1 via ORAL
  Filled 2015-02-28 (×7): qty 1

## 2015-02-28 MED ORDER — POLYETHYLENE GLYCOL 3350 17 G PO PACK: 17.0000 g | PACK | Freq: Every day | ORAL | Status: DC | PRN

## 2015-02-28 MED ORDER — DIPHENHYDRAMINE HCL 25 MG PO CAPS
25.0000 mg | ORAL_CAPSULE | Freq: Once | ORAL | Status: AC
  Administered 2015-02-28: 25 mg via ORAL
  Filled 2015-02-28: qty 1

## 2015-02-28 MED ORDER — IBUPROFEN 800 MG PO TABS
800.0000 mg | ORAL_TABLET | Freq: Three times a day (TID) | ORAL | Status: DC | PRN
  Administered 2015-03-02 – 2015-03-05 (×2): 800 mg via ORAL
  Filled 2015-02-28 (×3): qty 1

## 2015-02-28 MED ORDER — HYDROMORPHONE 0.3 MG/ML IV SOLN
INTRAVENOUS | Status: DC
  Administered 2015-02-28: 22:00:00 via INTRAVENOUS
  Administered 2015-03-01: 1.7 mg via INTRAVENOUS
  Administered 2015-03-01: 3.1 mg via INTRAVENOUS
  Administered 2015-03-01: 3.6 mg via INTRAVENOUS
  Administered 2015-03-01: 2.7 mg via INTRAVENOUS
  Filled 2015-02-28 (×3): qty 25

## 2015-02-28 MED ORDER — NALOXONE HCL 0.4 MG/ML IJ SOLN: 0.4000 mg | INTRAMUSCULAR | Status: DC | PRN

## 2015-02-28 MED ORDER — SODIUM CHLORIDE 0.9 % IV BOLUS (SEPSIS)
1000.0000 mL | Freq: Once | INTRAVENOUS | Status: AC
  Administered 2015-02-28: 1000 mL via INTRAVENOUS

## 2015-02-28 MED ORDER — ONDANSETRON HCL 4 MG PO TABS: 4.0000 mg | ORAL_TABLET | ORAL | Status: DC | PRN

## 2015-02-28 MED ORDER — SENNOSIDES-DOCUSATE SODIUM 8.6-50 MG PO TABS
1.0000 | ORAL_TABLET | Freq: Two times a day (BID) | ORAL | Status: DC
  Administered 2015-02-28 – 2015-03-06 (×12): 1 via ORAL
  Filled 2015-02-28 (×14): qty 1

## 2015-02-28 MED ORDER — ONDANSETRON HCL 4 MG/2ML IJ SOLN: 4.0000 mg | Freq: Four times a day (QID) | INTRAMUSCULAR | Status: DC | PRN

## 2015-02-28 MED ORDER — SODIUM CHLORIDE 0.9 % IJ SOLN: 9.0000 mL | INTRAMUSCULAR | Status: DC | PRN

## 2015-02-28 MED ORDER — DIPHENHYDRAMINE HCL 50 MG/ML IJ SOLN
12.5000 mg | Freq: Four times a day (QID) | INTRAMUSCULAR | Status: DC | PRN
  Administered 2015-03-01: 12.5 mg via INTRAVENOUS
  Filled 2015-02-28 (×2): qty 1

## 2015-02-28 MED ORDER — LEVOFLOXACIN 750 MG PO TABS
750.0000 mg | ORAL_TABLET | Freq: Once | ORAL | Status: AC
  Administered 2015-02-28: 750 mg via ORAL
  Filled 2015-02-28: qty 1

## 2015-02-28 MED ORDER — HYDROXYZINE HCL 25 MG PO TABS
25.0000 mg | ORAL_TABLET | ORAL | Status: DC | PRN
  Administered 2015-03-01: 25 mg via ORAL
  Administered 2015-03-02 – 2015-03-05 (×3): 50 mg via ORAL
  Filled 2015-02-28: qty 1
  Filled 2015-02-28 (×3): qty 2

## 2015-02-28 MED ORDER — HYDROXYUREA 500 MG PO CAPS
500.0000 mg | ORAL_CAPSULE | Freq: Every day | ORAL | Status: DC
  Administered 2015-03-01 – 2015-03-06 (×6): 500 mg via ORAL
  Filled 2015-02-28 (×7): qty 1

## 2015-02-28 MED ORDER — ENOXAPARIN SODIUM 40 MG/0.4ML ~~LOC~~ SOLN
40.0000 mg | SUBCUTANEOUS | Status: DC
  Administered 2015-02-28 – 2015-03-05 (×5): 40 mg via SUBCUTANEOUS
  Filled 2015-02-28 (×7): qty 0.4

## 2015-02-28 MED ORDER — DEXTROSE-NACL 5-0.45 % IV SOLN
INTRAVENOUS | Status: AC
  Administered 2015-02-28 – 2015-03-01 (×3): via INTRAVENOUS

## 2015-02-28 NOTE — ED Notes (Signed)
Pt c/o bilateral arm and leg sickle cell pain onset Wednesday. Pt seen yesterday for the same, but left early due to having an interview that day.

## 2015-02-28 NOTE — ED Provider Notes (Signed)
CSN: [id number]     Arrival date & time 02/28/15  1443 History   First MD Initiated Contact with Patient 02/28/15 1543     Chief Complaint  Patient presents with  . Sickle Cell Pain Crisis     (Consider location/radiation/quality/duration/timing/severity/associated sxs/prior Treatment) HPI Pt is a 24yo female with hx of sickle cell crisis, depression, headache, and abortion, presenting to ED with c/o gradually worsening bilateral arm and leg sickle cell pain that started 2 days ago on 02/26/15 after being discharged from the hospital.  Pt was seen yesterday for same by Dr. Alyson Ingles but states she had to leave early due to an interview.  Pt reports recent admission from 02/25/15-02/27/15 and has already missed 3 other interviews due to sickle cell pain.  Today, pain is aching, 9/10, feels similar to her sickle cell pain.  Pt also c/o chest tightness. Reports hx of acute chest in the past but states this does not feel as bed. Denies SOB. Denies fever, n/v/d.  Pt states she called D.r McKenzie's office today but was not able to be fit in and was advised he would not refill her medication as it had been too long since her last visit.  Pt was discharged with vicodin but states she did not bother to fill the medication as it was half the strength as she is use to.  She has not tried any pain medication PTA.    Past Medical History  Diagnosis Date  . Sickle cell crisis   . Depression   . Headache(784.0)   . Abortion     05/2012   Past Surgical History  Procedure Laterality Date  . Splenectomy    . Cholecystectomy N/A 11/30/2014    Procedure: LAPAROSCOPIC CHOLECYSTECTOMY SINGLE SITE WITH INTRAOPERATIVE CHOLANGIOGRAM;  Surgeon: Michael Boston, MD;  Location: [facility];  Service: General;  Laterality: N/A;   Family History  Problem Relation Age of Onset  . Hypertension Mother   . Sickle cell anemia Sister   . Kidney disease Sister   . Arthritis Sister   . Sickle cell anemia Sister   . Sickle cell trait  Sister   . Heart disease Maternal Aunt   . Heart disease Maternal Uncle    History  Substance Use Topics  . Smoking status: Never Smoker   . Smokeless tobacco: Never Used  . Alcohol Use: No   OB History    Gravida Para Term Preterm AB TAB SAB Ectopic Multiple Living   1    1          Review of Systems  Constitutional: Negative for fever, chills, diaphoresis, appetite change and fatigue.  Respiratory: Positive for chest tightness. Negative for cough and shortness of breath.   Cardiovascular: Negative for chest pain, palpitations and leg swelling.  Gastrointestinal: Negative for nausea, vomiting, abdominal pain and diarrhea.  Musculoskeletal: Positive for myalgias and arthralgias. Negative for back pain.       Bilateral upper and lower extremities  Skin: Negative for color change, pallor, rash and wound.  All other systems reviewed and are negative.     Allergies  Other  Home Medications   Prior to Admission medications   Medication Sig Start Date End Date Taking? Authorizing Provider  cetirizine (ZYRTEC) 10 MG tablet Take 10 mg by mouth daily.    Yes Historical Provider, MD  ibuprofen (ADVIL,MOTRIN) 200 MG tablet Take 400-600 mg by mouth every 6 (six) hours as needed for moderate pain.   Yes Historical Provider, MD  aspirin EC 81 MG EC tablet Take 1 tablet (81 mg total) by mouth daily. Patient not taking: Reported on 02/28/2015 02/19/15   Leana Gamer, MD  folic acid (FOLVITE) 1 MG tablet Take 1 tablet (1 mg total) by mouth daily. Patient not taking: Reported on 02/28/2015 02/19/15   Leana Gamer, MD  HYDROcodone-acetaminophen [facility]) 10-325 MG per tablet Take 1 tablet by mouth every 6 (six) hours as needed for moderate pain or severe pain (pain). Patient not taking: Reported on 02/28/2015 02/22/15   Doy Hutching, MD  hydroxyurea (HYDREA) 500 MG capsule Take 1 capsule (500 mg total) by mouth daily. May take with food to minimize GI side effects. Patient not  taking: Reported on 02/28/2015 02/22/15   Doy Hutching, MD  levofloxacin (LEVAQUIN) 750 MG tablet Take 1 tablet (750 mg total) by mouth daily. Patient not taking: Reported on 02/28/2015 02/27/15   Elwyn Reach, MD   BP 117/59 mmHg  Pulse 110  Temp(Src) 98.2 F (36.8 C)  Resp 20  SpO2 100%  LMP 02/13/2015 (Exact Date) Physical Exam  Constitutional: She is oriented to person, place, and time. She appears well-developed and well-nourished. She appears distressed.  Pt lying in exam bed, rocking her legs back and forth as well as sharking her hands, appears uncomfortable.   HENT:  Head: Normocephalic and atraumatic.  Eyes: Conjunctivae are normal. No scleral icterus.  Neck: Normal range of motion.  Cardiovascular: Regular rhythm and normal heart sounds.  Tachycardia present.   Pulmonary/Chest: Effort normal and breath sounds normal. No respiratory distress. She has no wheezes. She has no rales. She exhibits no tenderness.  Abdominal: Soft. Bowel sounds are normal. She exhibits no distension and no mass. There is no tenderness. There is no rebound and no guarding.  Musculoskeletal: Normal range of motion. She exhibits no edema or tenderness.  Neurological: She is alert and oriented to person, place, and time.  Skin: Skin is warm and dry. No rash noted. She is not diaphoretic. No erythema.  Nursing note and vitals reviewed.   ED Course  Procedures (including critical care time) Labs Review Labs Reviewed  CBC WITH DIFFERENTIAL/PLATELET - Abnormal; Notable for the following:    WBC 14.4 (*)    RBC 3.11 (*)    Hemoglobin 9.2 (*)    HCT 26.3 (*)    Platelets 646 (*)    Neutro Abs 8.8 (*)    Lymphs Abs 4.3 (*)    Monocytes Absolute 1.2 (*)    All other components within normal limits  COMPREHENSIVE METABOLIC PANEL - Abnormal; Notable for the following:    Total Protein 8.4 (*)    Total Bilirubin 2.1 (*)    All other components within normal limits  RETICULOCYTES - Abnormal;  Notable for the following:    Retic Ct Pct 4.9 (*)    RBC. 3.11 (*)    All other components within normal limits  POC URINE PREG, ED    Imaging Review Dg Chest 2 View  02/28/2015   CLINICAL DATA:  Chest pain; sickle cell crisis  EXAM: CHEST  2 VIEW  COMPARISON:  Chest radiograph and chest CT February 25, 2015  FINDINGS: Subtle patchy opacity is noted in both upper lobes, suspicious for patchy pneumonia. Lungs elsewhere appear clear. The heart size and pulmonary vascularity are normal. No adenopathy. Several endplate defects are noted in the thoracic spine, upright representing infarcts from known sickle cell disease.  IMPRESSION: Subtle patchy opacity in each upper  lobe. Lungs elsewhere clear. No evidence of volume overload.   Electronically Signed   By: Lowella Grip III M.D.   On: 02/28/2015 19:53     EKG Interpretation None      MDM   Final diagnoses:  Sickle cell anemia with pain  HCAP (healthcare-associated pneumonia)    Pt is a 24yo female with hx of sickle cell, presenting to ED with sickle cell pain.  Timeline of pt's pain inconsistent with pt's recent admission and discharge as pt reports pain started on Wednesday, 02/26/15, however, pt was admitted from 02/25/15.  Pt is afebrile.  Mild tachycardia, likely due to pain.  O2 Sat is 100% on RA. Pt is non-toxic appearing.   6:22 PM Pt still waiting to be taken to get CXR.  Pt has had 2mg  dilaudid, 1L IV fluids and 25mg  PO benadryl.  Pt states her pain has worsened to 10/10 up from 9/10 since being in ED. Will give another 2mg  dilaudid.  Pt may need to be admitted for sickle cell pain crisis if pain cannot be managed in ED.    Case Management spoke with pt.  Per note from case management, pt became upset CM was contacted to help pt with medication. Pt advised CM she could afford or her medications in a few days, however, also mentioned she was unable to purchase her levaquin due to the cost.  Pt did not mentioned her difficulty  obtaining antibiotic to this provider. Pt was concerned about not being able to get her stronger pain medication.   Will give dose of levaquin in ED.   Labs: no elevated retic count, mildly elevated WBC at 14.4, elevated from 10.9 two days ago otherwise, c/w previous labs.   CXR: subtle patchy opacity in each upper lobe.  Lungs elsewhere clear.  No evidence of volume overload.    Discussed pt with Dr. Darl Householder, agreed pt is safe for discharge home and strongly encouraged to pick up antibiotics for her pneumonia.    8:11 PM Discussed tx plan and discharge with pt. Pt became very upset she will be discharged as she states her pain has not improved despite 4mg  dilaudid.  Pt states she knows she left the hospital early due to having to go to an interview.   Discussed change in plan with Dr. Darl Householder, will admit pt for further tx of HCAP as pt has not been taking her home medications, associated sickle cell pain.    8:29 PM Consulted with Dr. Roel Cluck, [facility], agrees to admit pt to tele bed as pt has c/o chest tightness.  Temporary orders placed.    Noland Fordyce, PA-C 02/28/15 2030  Wandra Arthurs, MD 03/01/15 [phone]

## 2015-02-28 NOTE — Progress Notes (Addendum)
24 yr old female with 4 ED visits and 3 admissions in the last 6 months  Cm consult noted for medication assistance.  Cm spoke with pt about cm consult Pt tearful with elevated tone of voice stating she does not need medication assistance.  Reports she has medicaid and would be able to afford her medications in a "few days" "I called my family doctor to see me today and he was unable to help me with them." referring to medications PCP is [employer].  Pt informed Cm she did not like "the doctor telling you that"  Throughout all this the pt is crying.  Pt admits to cm she is "frustrated and feel I have been treated bad." Pt wanting to see a Agricultural consultant. Cm allowed pt to ventilate her feelings about leaving "too early the last time because I was trying to get home for an interview.  I have to get a job to take care of my household."  Cm provided empathy, sympathy and comfort measure (washcloth, comforting words for pt)   Pt did mentioned that the antibiotic she was sent out with was "too expensive for me" Cm noted pt d/c with Levaquin Cm noted levaquin price for 750 mg qd.  Goodrx notes cost at $24.68 with coupon Pt given goodrx information to assist if not correct dosage, frequency & coupon for levaquin Pt very appreciative of resource to assist (goodrx)  CM updated ED charge RN  ED CM noted on 02/26/15 Unit CM notes "Received consult to assist with medication affordability. Patient stated that she is a member of the [facility] program with [employer] and has a card but in order for her care and medications to be covered her PCP has to authorize the card and she is having difficulty with her PCP authorizing her card. She stated that she had to pay $50.00 last time for her Vicodin from [facility] on [address]. This CM contacted the [facility] and left a message for her counselor Margarite at [phone]. Also discussed the [employer] and provided the patient with coupons for Vicodin and at [facility] the cost is $10 for 30  days and $16 for 60 days. The patient stated that she can afford those prices and was appreciative of the coupons and resources. Will continue to follow"

## 2015-02-28 NOTE — H&P (Signed)
PCP:  [name], MD    Referring provider: Hilda Blades   Chief Complaint:  pain  HPI: Carmen [name] is a 24 y.o. female   has a past medical history of Sickle cell crisis; Depression; Headache(784.0); and Abortion.   Patient is unknown history of sickle cell disease followed by Dr. Noah Delaine  Patient was admitted recently on 26th of April with atypical chest pain pneumonia and sickle cell crisis. She was started on cefepime and vancomycin and then changed to Levaquin. Patient left prior to completion of her course go to an interview. Apparently patient was not able to fill her prescription for Levaquin. Patient presented back today again with pain in both of her arms and legs.  Patient again reports some chest tightness report she have had chest tightness during her prior admission CT angio of the chest showed no evidence of PE but evidence of early pneumonia 2 days ago.  She apparently try to present to her regular physician with complaints of generalized pain that there was no openings for an appointment. Patient states that she did not really feel any of her medications due to financial reasons but also felt that her Vicodin dose was too low so there was no point to fill it.  Patient does not endorse any fevers. Repeat chest x-ray showing persistent infiltrates. She reports some cough coughing up some yellow mucus.    Hospitalist was called for admission for consult crisis and pneumonia  Review of Systems:    Pertinent positives include: Pain and arms and legs chest tightness  Constitutional:  No weight loss, night sweats, Fevers, chills, fatigue, weight loss  HEENT:  No headaches, Difficulty swallowing,Tooth/dental problems,Sore throat,  No sneezing, itching, ear ache, nasal congestion, post nasal drip,  Cardio-vascular:  Orthopnea, PND, anasarca, dizziness, palpitations.no Bilateral lower extremity swelling  GI:  No heartburn, indigestion, abdominal pain, nausea,  vomiting, diarrhea, change in bowel habits, loss of appetite, melena, blood in stool, hematemesis Resp:  no shortness of breath at rest. No dyspnea on exertion, No excess mucus, no productive cough, No non-productive cough, No coughing up of blood.No change in color of mucus.No wheezing. Skin:  no rash or lesions. No jaundice GU:  no dysuria, change in color of urine, no urgency or frequency. No straining to urinate.  No flank pain.  Musculoskeletal:  No joint pain or no joint swelling. No decreased range of motion. No back pain.  Psych:  No change in mood or affect. No depression or anxiety. No memory loss.  Neuro: no localizing neurological complaints, no tingling, no weakness, no double vision, no gait abnormality, no slurred speech, no confusion  Otherwise ROS are negative except for above, 10 systems were reviewed  Past Medical History: Past Medical History  Diagnosis Date  . Sickle cell crisis   . Depression   . Headache(784.0)   . Abortion     05/2012   Past Surgical History  Procedure Laterality Date  . Splenectomy    . Cholecystectomy N/A 11/30/2014    Procedure: LAPAROSCOPIC CHOLECYSTECTOMY SINGLE SITE WITH INTRAOPERATIVE CHOLANGIOGRAM;  Surgeon: Michael Boston, MD;  Location: [facility];  Service: General;  Laterality: N/A;     Medications: Prior to Admission medications   Medication Sig Start Date End Date Taking? Authorizing Provider  cetirizine (ZYRTEC) 10 MG tablet Take 10 mg by mouth daily.    Yes Historical Provider, MD  ibuprofen (ADVIL,MOTRIN) 200 MG tablet Take 400-600 mg by mouth every 6 (six) hours as needed for moderate  pain.   Yes Historical Provider, MD  aspirin EC 81 MG EC tablet Take 1 tablet (81 mg total) by mouth daily. Patient not taking: Reported on 02/28/2015 02/19/15   Leana Gamer, MD  folic acid (FOLVITE) 1 MG tablet Take 1 tablet (1 mg total) by mouth daily. Patient not taking: Reported on 02/28/2015 02/19/15   Leana Gamer, MD    HYDROcodone-acetaminophen [facility]) 10-325 MG per tablet Take 1 tablet by mouth every 6 (six) hours as needed for moderate pain or severe pain (pain). Patient not taking: Reported on 02/28/2015 02/22/15   Doy Hutching, MD  hydroxyurea (HYDREA) 500 MG capsule Take 1 capsule (500 mg total) by mouth daily. May take with food to minimize GI side effects. Patient not taking: Reported on 02/28/2015 02/22/15   Doy Hutching, MD  levofloxacin (LEVAQUIN) 750 MG tablet Take 1 tablet (750 mg total) by mouth daily. Patient not taking: Reported on 02/28/2015 02/27/15   Elwyn Reach, MD    Allergies:   Allergies  Allergen Reactions  . Other Nausea Only    Patient stated that all iv pain medication make her have really bad nausea    Social History:  Ambulatory   independently   Lives at home  With family     reports that she has never smoked. She has never used smokeless tobacco. She reports that she does not drink alcohol or use illicit drugs.    Family History: family history includes Arthritis in her sister; Heart disease in her maternal aunt and maternal uncle; Hypertension in her mother; Kidney disease in her sister; Sickle cell anemia in her sister and sister; Sickle cell trait in her sister.    Physical Exam: Patient Vitals for the past 24 hrs:  BP Temp Pulse Resp SpO2  02/28/15 1905 117/59 mmHg - 110 20 100 %  02/28/15 1800 111/68 mmHg - 105 26 98 %  02/28/15 1745 134/84 mmHg - 111 18 100 %  02/28/15 1450 - 98.2 F (36.8 C) 102 18 -  02/28/15 1448 145/82 mmHg - - - 100 %    1. General:  in No Acute distress 2. Psychological: Alert and Oriented 3. Head/ENT:   Moist  Mucous Membranes                          Head Non traumatic, neck supple                          Normal   Dentition 4. SKIN: normal Skin turgor,  Skin clean Dry and intact no rash 5. Heart: Regular rate and rhythm no Murmur, Rub or gallop 6. Lungs:   no wheezes or crackles   7. Abdomen: Soft, non-tender,  Non distended 8. Lower extremities: no clubbing, cyanosis, or edema 9. Neurologically Grossly intact, moving all 4 extremities equally 10. MSK: Normal range of motion  body mass index is unknown because there is no weight on file.   Labs on Admission:   Results for orders placed or performed during the hospital encounter of 02/28/15 (from the past 24 hour(s))  CBC WITH DIFFERENTIAL     Status: Abnormal   Collection Time: 02/28/15  3:11 PM  Result Value Ref Range   WBC 14.4 (H) 4.0 - 10.5 K/uL   RBC 3.11 (L) 3.87 - 5.11 MIL/uL   Hemoglobin 9.2 (L) 12.0 - 15.0 g/dL   HCT 26.3 (L) 36.0 -  46.0 %   MCV 84.6 78.0 - 100.0 fL   MCH 29.6 26.0 - 34.0 pg   MCHC 35.0 30.0 - 36.0 g/dL   RDW 15.2 11.5 - 15.5 %   Platelets 646 (H) 150 - 400 K/uL   Neutrophils Relative % 60 43 - 77 %   Neutro Abs 8.8 (H) 1.7 - 7.7 K/uL   Lymphocytes Relative 30 12 - 46 %   Lymphs Abs 4.3 (H) 0.7 - 4.0 K/uL   Monocytes Relative 9 3 - 12 %   Monocytes Absolute 1.2 (H) 0.1 - 1.0 K/uL   Eosinophils Relative 1 0 - 5 %   Eosinophils Absolute 0.1 0.0 - 0.7 K/uL   Basophils Relative 0 0 - 1 %   Basophils Absolute 0.0 0.0 - 0.1 K/uL  Comprehensive metabolic panel     Status: Abnormal   Collection Time: 02/28/15  3:11 PM  Result Value Ref Range   Sodium 141 135 - 145 mmol/L   Potassium 3.6 3.5 - 5.1 mmol/L   Chloride 107 96 - 112 mmol/L   CO2 23 19 - 32 mmol/L   Glucose, Bld 83 70 - 99 mg/dL   BUN 12 6 - 23 mg/dL   Creatinine, Ser 0.58 0.50 - 1.10 mg/dL   Calcium 9.7 8.4 - 10.5 mg/dL   Total Protein 8.4 (H) 6.0 - 8.3 g/dL   Albumin 4.6 3.5 - 5.2 g/dL   AST 29 0 - 37 U/L   ALT 29 0 - 35 U/L   Alkaline Phosphatase 59 39 - 117 U/L   Total Bilirubin 2.1 (H) 0.3 - 1.2 mg/dL   GFR calc non Af Amer >90 >90 mL/min   GFR calc Af Amer >90 >90 mL/min   Anion gap 11 5 - 15  Reticulocytes     Status: Abnormal   Collection Time: 02/28/15  3:11 PM  Result Value Ref Range   Retic Ct Pct 4.9 (H) 0.4 - 3.1 %   RBC. 3.11  (L) 3.87 - 5.11 MIL/uL   Retic Count, Manual 152.4 19.0 - 186.0 K/uL  POC urine preg, ED (not at [facility])     Status: None   Collection Time: 02/28/15  6:27 PM  Result Value Ref Range   Preg Test, Ur NEGATIVE NEGATIVE    UA not obtained  No results found for: HGBA1C  Estimated Creatinine Clearance: 114.8 mL/min (by C-G formula based on Cr of 0.58).  BNP (last 3 results) No results for input(s): PROBNP in the last 8760 hours.  Other results:  I have pearsonaly reviewed this: ECG REPORT from 02/26/2015  Rate: 111  Rhythm: Sinus tachycardia ST&T Change: No significant ischemic changes QTC 469  There were no vitals filed for this visit.   Cultures:    Component Value Date/Time   SDES NOSE 11/30/2014 0722   SPECREQUEST NONE 11/30/2014 0722   CULT  11/30/2014 0722    NO STAPHYLOCOCCUS AUREUS ISOLATED Note: No MRSA Isolated Performed at [facility] 12/02/2014 FINAL 11/30/2014 1517     Radiological Exams on Admission: Dg Chest 2 View  02/28/2015   CLINICAL DATA:  Chest pain; sickle cell crisis  EXAM: CHEST  2 VIEW  COMPARISON:  Chest radiograph and chest CT February 25, 2015  FINDINGS: Subtle patchy opacity is noted in both upper lobes, suspicious for patchy pneumonia. Lungs elsewhere appear clear. The heart size and pulmonary vascularity are normal. No adenopathy. 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  IMPRESSION: Subtle patchy opacity in each upper lobe. Lungs elsewhere clear. No evidence of volume overload.   Electronically Signed   By: Lowella Grip III M.D.   On: 02/28/2015 19:53    Chart has been reviewed  Family not at  Bedside    Assessment/Plan  24 year old female with history of sickle cell disease who is admitted for sickle cell pain crisis and pneumonia but left early due to social reasons sent back with pain. Patient never filled in her prescriptions.  Neither from the pain medications nor her antibiotics.  Present on Admission:  . HCAP (healthcare-associated pneumonia) - not sure if his sister H Pneumonia versus undertreated pneumonia that she had early. We'll restart Levaquin as patient was just recently discharged and only skipped one dose . Atypical chest pain - this has been ongoing for past few days. We'll monitor on telemetry given tachycardia recent CT scan showed no evidence of PE  . Leukocytosis  - likely secondary to pneumonia  . Sickle cell pain crisis -admit per sickle cell crisis protocol. I've ordered PCA but given the patient has been fairly narcotic may will order full dose rather than high-dose   Prophylaxis:  Lovenox   CODE STATUS:  FULL CODE as per patient   Disposition:    To home once workup is complete and patient is stable  Other plan as per orders.  I have spent a total of 55 min on this admission  Teren Zurcher 02/28/2015, 8:39 PM  [facility]  Pager [phone]   after 2 AM please page floor coverage PA If 7AM-7PM, please contact the day team taking care of the patient  [email or web address]  Password [email or web address]

## 2015-02-28 NOTE — ED Notes (Signed)
Xray notified.

## 2015-03-01 LAB — COMPREHENSIVE METABOLIC PANEL
ALT: 20 U/L (ref 0–35)
AST: 20 U/L (ref 0–37)
Albumin: 3.6 g/dL (ref 3.5–5.2)
Alkaline Phosphatase: 46 U/L (ref 39–117)
Anion gap: 7 (ref 5–15)
BUN: 6 mg/dL (ref 6–23)
CALCIUM: 8.9 mg/dL (ref 8.4–10.5)
CO2: 25 mmol/L (ref 19–32)
Chloride: 107 mmol/L (ref 96–112)
Creatinine, Ser: 0.44 mg/dL — ABNORMAL LOW (ref 0.50–1.10)
GFR calc Af Amer: >90 mL/min (ref >90)
GFR calc non Af Amer: >90 mL/min (ref >90)
Glucose, Bld: 97 mg/dL (ref 70–99)
Potassium: 3.3 mmol/L — ABNORMAL LOW (ref 3.5–5.1)
Sodium: 139 mmol/L (ref 135–145)
TOTAL PROTEIN: 6.9 g/dL (ref 6.0–8.3)
Total Bilirubin: 1.4 mg/dL — ABNORMAL HIGH (ref 0.3–1.2)

## 2015-03-01 LAB — CBC WITH DIFFERENTIAL/PLATELET
Basophils Absolute: 0 10*3/uL (ref 0.0–0.1)
Basophils Relative: 0 % (ref 0–1)
EOS ABS: 0.1 10*3/uL (ref 0.0–0.7)
Eosinophils Relative: 1 % (ref 0–5)
HCT: 22.7 % — ABNORMAL LOW (ref 36.0–46.0)
Hemoglobin: 7.9 g/dL — ABNORMAL LOW (ref 12.0–15.0)
LYMPHS PCT: 46 % (ref 12–46)
Lymphs Abs: 6.7 10*3/uL — ABNORMAL HIGH (ref 0.7–4.0)
MCH: 29.6 pg (ref 26.0–34.0)
MCHC: 34.8 g/dL (ref 30.0–36.0)
MCV: 85 fL (ref 78.0–100.0)
MONOS PCT: 11 % (ref 3–12)
Monocytes Absolute: 1.6 10*3/uL — ABNORMAL HIGH (ref 0.1–1.0)
Neutro Abs: 6.1 10*3/uL (ref 1.7–7.7)
Neutrophils Relative %: 42 % — ABNORMAL LOW (ref 43–77)
Platelets: 554 10*3/uL — ABNORMAL HIGH (ref 150–400)
RBC: 2.67 MIL/uL — ABNORMAL LOW (ref 3.87–5.11)
RDW: 15.4 % (ref 11.5–15.5)
WBC: 14.5 10*3/uL — AB (ref 4.0–10.5)

## 2015-03-01 LAB — RETICULOCYTES
RBC.: 2.67 MIL/uL — AB (ref 3.87–5.11)
RETIC CT PCT: 6.1 % — AB (ref 0.4–3.1)
Retic Count, Absolute: 162.9 10*3/uL (ref 19.0–186.0)

## 2015-03-01 LAB — PHOSPHORUS: Phosphorus: 4.2 mg/dL (ref 2.3–4.6)

## 2015-03-01 LAB — MAGNESIUM: MAGNESIUM: 1.7 mg/dL (ref 1.5–2.5)

## 2015-03-01 MED ORDER — HYDROMORPHONE 0.3 MG/ML IV SOLN
INTRAVENOUS | Status: DC
  Administered 2015-03-01: 1.2 mg via INTRAVENOUS
  Administered 2015-03-01: 0.9 mL via INTRAVENOUS
  Administered 2015-03-02: 01:00:00 via INTRAVENOUS
  Administered 2015-03-02: 0.6 mg via INTRAVENOUS
  Filled 2015-03-01: qty 25

## 2015-03-01 NOTE — Progress Notes (Signed)
Subjective: 24 year old female who was just discharged 2 days ago with sickle cell painful crisis bike in the hospital because she was unable to get hot outpatient medications.  She had pneumonia supposed to also be on antibiotics which she did not get.  She is now having pain at 9 out of 10.  She is on low dose Dilaudid PCA because she is nave to opiates.  She has some cough but no shortness of breath.  No fever no chills.  Patient was unable to get her primary care physician when she left the last time.  She actually left AMA before she was ready for discharge.  Objective: Vital signs in last 24 hours: Temp:  [97.4 F (36.3 C)-98.6 F (37 C)] 98.2 F (36.8 C) (04/30 1345) Pulse Rate:  [71-114] 78 (04/30 1345) Resp:  [12-22] 16 (04/30 1802) BP: (103-145)/(44-81) 103/44 mmHg (04/30 1345) SpO2:  [95 %-100 %] 98 % (04/30 1802) Weight:  [88.225 kg (194 lb 8 oz)] 88.225 kg (194 lb 8 oz) (04/29 2120) Weight change:  Last BM Date: 02/28/15  Intake/Output from previous day: 04/29 0701 - 04/30 0700 In: 3345 [P.O.:540; I.V.:1805; IV Piggyback:1000] Out: 1 [Urine:1] Intake/Output this shift:    General appearance: alert, cooperative and no distress Head: Normocephalic, without obvious abnormality, atraumatic Eyes: conjunctivae/corneas clear. PERRL, EOM's intact. Fundi benign. Neck: no adenopathy, no carotid bruit, no JVD, supple, symmetrical, trachea midline and thyroid not enlarged, symmetric, no tenderness/mass/nodules Back: symmetric, no curvature. ROM normal. No CVA tenderness. Resp: clear to auscultation bilaterally Cardio: regular rate and rhythm, S1, S2 normal, no murmur, click, rub or gallop GI: soft, non-tender; bowel sounds normal; no masses,  no organomegaly Extremities: extremities normal, atraumatic, no cyanosis or edema Pulses: 2+ and symmetric Skin: Skin color, texture, turgor normal. No rashes or lesions Neurologic: Grossly normal  Lab Results:  Recent Labs   02/28/15 1511 03/01/15 0621  WBC 14.4* 14.5*  HGB 9.2* 7.9*  HCT 26.3* 22.7*  PLT 646* 554*   BMET  Recent Labs  02/28/15 1511 03/01/15 0621  NA 141 139  K 3.6 3.3*  CL 107 107  CO2 23 25  GLUCOSE 83 97  BUN 12 6  CREATININE 0.58 0.44*  CALCIUM 9.7 8.9    Studies/Results: Dg Chest 2 View  02/28/2015   CLINICAL DATA:  Chest pain; sickle cell crisis  EXAM: CHEST  2 VIEW  COMPARISON:  Chest radiograph and chest CT February 25, 2015  FINDINGS: Subtle patchy opacity is noted in both upper lobes, suspicious for patchy pneumonia. Lungs elsewhere appear clear. The heart size and pulmonary vascularity are normal. No adenopathy. Several endplate defects are noted in the thoracic spine, upright representing infarcts from known sickle cell disease.  IMPRESSION: Subtle patchy opacity in each upper lobe. Lungs elsewhere clear. No evidence of volume overload.   Electronically Signed   By: Lowella Grip III M.D.   On: 02/28/2015 19:53    Medications: I have reviewed the patient's current medications.  Assessment/Plan: A 24 year old female admitted with sickle cell painful crisis and pneumonia.  #1 pneumonia: Patient is currently back on Levaquin.  She seems to be responding to treatment.  No fever no significant leukocytosis.  We will transition her to oral Levaquin at discharge.  #2 sickle cell painful crisis: Most likely triggered by the pneumonia.  Patient is going to be placed on weight based dose of Dilaudid for the next 24 hours.  She will then transition to oral medication.  #3 sickle cell  anemia: Continue to monitor H&H which is stable  #4 medication noncompliance: Patient has been counseled.  We will re-establish contact with her primary care physician so she can get her medication at discharge  LOS: 1 day   Vermon Grays,LAWAL 03/01/2015, 7:21 PM

## 2015-03-02 DIAGNOSIS — D72829 Elevated white blood cell count, unspecified: Secondary | ICD-10-CM

## 2015-03-02 LAB — CBC WITH DIFFERENTIAL/PLATELET
BASOS ABS: 0 10*3/uL (ref 0.0–0.1)
Basophils Relative: 0 % (ref 0–1)
EOS ABS: 0.2 10*3/uL (ref 0.0–0.7)
Eosinophils Relative: 2 % (ref 0–5)
HEMATOCRIT: 24.9 % — AB (ref 36.0–46.0)
HEMOGLOBIN: 8.7 g/dL — AB (ref 12.0–15.0)
LYMPHS ABS: 5.3 10*3/uL — AB (ref 0.7–4.0)
Lymphocytes Relative: 47 % — ABNORMAL HIGH (ref 12–46)
MCH: 29.7 pg (ref 26.0–34.0)
MCHC: 34.9 g/dL (ref 30.0–36.0)
MCV: 85 fL (ref 78.0–100.0)
MONO ABS: 1.6 10*3/uL — AB (ref 0.1–1.0)
MONOS PCT: 14 % — AB (ref 3–12)
Neutro Abs: 4.1 10*3/uL (ref 1.7–7.7)
Neutrophils Relative %: 37 % — ABNORMAL LOW (ref 43–77)
Platelets: 494 10*3/uL — ABNORMAL HIGH (ref 150–400)
RBC: 2.93 MIL/uL — AB (ref 3.87–5.11)
RDW: 15.1 % (ref 11.5–15.5)
WBC: 11.3 10*3/uL — ABNORMAL HIGH (ref 4.0–10.5)

## 2015-03-02 LAB — COMPREHENSIVE METABOLIC PANEL
ALT: 21 U/L (ref 14–54)
AST: 26 U/L (ref 15–41)
Albumin: 3.6 g/dL (ref 3.5–5.0)
Alkaline Phosphatase: 53 U/L (ref 38–126)
Anion gap: 7 (ref 5–15)
BILIRUBIN TOTAL: 0.9 mg/dL (ref 0.3–1.2)
BUN: <5 mg/dL — ABNORMAL LOW (ref 6–20)
CALCIUM: 8.9 mg/dL (ref 8.9–10.3)
CHLORIDE: 108 mmol/L (ref 101–111)
CO2: 23 mmol/L (ref 22–32)
CREATININE: 0.46 mg/dL (ref 0.44–1.00)
GFR calc non Af Amer: >60 mL/min (ref >60)
GLUCOSE: 95 mg/dL (ref 70–99)
Potassium: 3.7 mmol/L (ref 3.5–5.1)
Sodium: 138 mmol/L (ref 135–145)
Total Protein: 6.9 g/dL (ref 6.5–8.1)

## 2015-03-02 MED ORDER — HYDROMORPHONE 0.3 MG/ML IV SOLN
INTRAVENOUS | Status: DC
  Administered 2015-03-02: 2.1 mg via INTRAVENOUS
  Administered 2015-03-02: 3.3 mg via INTRAVENOUS
  Administered 2015-03-02: 1.3 mg via INTRAVENOUS
  Administered 2015-03-02: 3.9 mg via INTRAVENOUS
  Administered 2015-03-03: 3.3 mg via INTRAVENOUS
  Administered 2015-03-03: 13:00:00 via INTRAVENOUS
  Administered 2015-03-03: 0.6 mg via INTRAVENOUS
  Administered 2015-03-03: 02:00:00 via INTRAVENOUS
  Administered 2015-03-03: 4.2 mg via INTRAVENOUS
  Administered 2015-03-04: 0.09 mg via INTRAVENOUS
  Administered 2015-03-04: 5.1 mg via INTRAVENOUS
  Administered 2015-03-04: 10:00:00 via INTRAVENOUS
  Administered 2015-03-04: 1.8 mg via INTRAVENOUS
  Administered 2015-03-05: 0.3 mg via INTRAVENOUS
  Administered 2015-03-05: 6 mg via INTRAVENOUS
  Administered 2015-03-05: 03:00:00 via INTRAVENOUS
  Administered 2015-03-05: 0.3 mg via INTRAVENOUS
  Administered 2015-03-05: 1.5 mg via INTRAVENOUS
  Administered 2015-03-05: 0.3 mg via INTRAVENOUS
  Filled 2015-03-02 (×7): qty 25

## 2015-03-02 MED ORDER — ZOLPIDEM TARTRATE 5 MG PO TABS
5.0000 mg | ORAL_TABLET | Freq: Once | ORAL | Status: AC
  Administered 2015-03-02: 5 mg via ORAL
  Filled 2015-03-02: qty 1

## 2015-03-02 MED ORDER — ZOLPIDEM TARTRATE 5 MG PO TABS
5.0000 mg | ORAL_TABLET | Freq: Every evening | ORAL | Status: DC | PRN
  Administered 2015-03-02 – 2015-03-06 (×4): 5 mg via ORAL
  Filled 2015-03-02 (×4): qty 1

## 2015-03-03 ENCOUNTER — Telehealth (HOSPITAL_COMMUNITY): Payer: Self-pay | Admitting: *Deleted

## 2015-03-03 ENCOUNTER — Ambulatory Visit: Payer: Medicaid Other | Admitting: Internal Medicine

## 2015-03-03 LAB — COMPREHENSIVE METABOLIC PANEL
ALT: 26 U/L (ref 14–54)
AST: 28 U/L (ref 15–41)
Albumin: 3.8 g/dL (ref 3.5–5.0)
Alkaline Phosphatase: 50 U/L (ref 38–126)
Anion gap: 6 (ref 5–15)
BILIRUBIN TOTAL: 1 mg/dL (ref 0.3–1.2)
BUN: 8 mg/dL (ref 6–20)
CALCIUM: 9.4 mg/dL (ref 8.9–10.3)
CO2: 28 mmol/L (ref 22–32)
Chloride: 105 mmol/L (ref 101–111)
Creatinine, Ser: 0.45 mg/dL (ref 0.44–1.00)
GFR calc Af Amer: >60 mL/min (ref >60)
GFR calc non Af Amer: >60 mL/min (ref >60)
Glucose, Bld: 93 mg/dL (ref 70–99)
Potassium: 3.4 mmol/L — ABNORMAL LOW (ref 3.5–5.1)
Sodium: 139 mmol/L (ref 135–145)
TOTAL PROTEIN: 6.8 g/dL (ref 6.5–8.1)

## 2015-03-03 LAB — CBC WITH DIFFERENTIAL/PLATELET
Basophils Absolute: 0 10*3/uL (ref 0.0–0.1)
Basophils Relative: 0 % (ref 0–1)
Eosinophils Absolute: 0.2 10*3/uL (ref 0.0–0.7)
Eosinophils Relative: 1 % (ref 0–5)
HEMATOCRIT: 26.1 % — AB (ref 36.0–46.0)
Hemoglobin: 8.8 g/dL — ABNORMAL LOW (ref 12.0–15.0)
LYMPHS ABS: 5.5 10*3/uL — AB (ref 0.7–4.0)
LYMPHS PCT: 50 % — AB (ref 12–46)
MCH: 28.8 pg (ref 26.0–34.0)
MCHC: 33.7 g/dL (ref 30.0–36.0)
MCV: 85.3 fL (ref 78.0–100.0)
MONOS PCT: 10 % (ref 3–12)
Monocytes Absolute: 1.1 10*3/uL — ABNORMAL HIGH (ref 0.1–1.0)
NEUTROS ABS: 4.4 10*3/uL (ref 1.7–7.7)
NEUTROS PCT: 39 % — AB (ref 43–77)
Platelets: 569 10*3/uL — ABNORMAL HIGH (ref 150–400)
RBC: 3.06 MIL/uL — ABNORMAL LOW (ref 3.87–5.11)
RDW: 15.7 % — ABNORMAL HIGH (ref 11.5–15.5)
WBC: 11.1 10*3/uL — ABNORMAL HIGH (ref 4.0–10.5)

## 2015-03-03 NOTE — Progress Notes (Signed)
Subjective: Patient is doing better today.  She still has pain as 7 out of 10 but has done better.  No significant shortness of breath.  She uses a 18 mg of the Dilaudid over the last 24 hours was 44 demands and 36 deliveries.  No fever no chills.  She has some headache only.  Objective: Vital signs in last 24 hours: Temp:  [97.5 F (36.4 C)-98.3 F (36.8 C)] 98.3 F (36.8 C) (05/01 0347) Pulse Rate:  [74-100] 75 (05/01 0347) Resp:  [9-20] 14 (05/01 0400) BP: (109-138)/(57-112) 109/92 mmHg (05/01 0347) SpO2:  [96 %-100 %] 98 % (05/01 0400) Weight:  [89.721 kg (197 lb 12.8 oz)] 89.721 kg (197 lb 12.8 oz) (05/01 0347) Weight change:  Last BM Date: 02/28/15  Intake/Output from previous day: 04/30 70105/01 0700 In: 1324.6 [P.O.:1320; I.V.:4.6] Out: -  Intake/Output this shift:    General appearance: alert, cooperative and no distress Head: Normocephalic, without obvious abnormality, atraumatic Eyes: conjunctivae/corneas clear. PERRL, EOM's intact. Fundi benign. Neck: no adenopathy, no carotid bruit, no JVD, supple, symmetrical, trachea midline and thyroid not enlarged, symmetric, no tenderness/mass/nodules Back: symmetric, no curvature. ROM normal. No CVA tenderness. Resp: clear to auscultation bilaterally Cardio: regular rate and rhythm, S1, S2 normal, no murmur, click, rub or gallop GI: soft, non-tender; bowel sounds normal; no masses,  no organomegaly Extremities: extremities normal, atraumatic, no cyanosis or edema Pulses: 2+ and symmetric Skin: Skin color, texture, turgor normal. No rashes or lesions Neurologic: Grossly normal  Lab Results:  Recent Labs  03/02/15 0548  WBC 11.3*  HGB 8.7*  HCT 24.9*  PLT 494*   BMET  Recent Labs  03/02/15 0548  NA 138  K 3.7  CL 108  CO2 23  GLUCOSE 95  BUN <5*  CREATININE 0.46  CALCIUM 8.9    Studies/Results: No results found.  Medications: I have reviewed the patient's current medications.  Assessment/Plan: A  24 year old female admitted with sickle cell painful crisis and pneumonia.  #1 pneumonia: Continue IV Levaquin.  We will transition her to oral Levaquin at discharge.  #2 sickle cell painful crisis: I will decrease hi Dilaudid dose to 0.3 with a maximum of 1.8 mg an hour.  Start oral medications.    #3 sickle cell anemia: Continue to monitor H&H which is stable  #4 medication noncompliance: Patient has been counseled.  We will re-establish contact with her primary care physician so she can get her medication at discharge  LOS: 3 days   GARBA,LAWAL 03/02/2015, 8:26 AM

## 2015-03-03 NOTE — Progress Notes (Signed)
Subjective: Patient is complaining of pain in her legs today to the extent she could not walk this morning. Her PCA is off now due to IV access issue. She has been on oral medications. Denies SOB, no fever, no cough. Remains on Levaquin empirically. This is day 7 total. She might not need more than 10 days total. Has used 14 mg of the dilaudid with 49 demands and 49 deliveries in the last 24 hours.  Objective: Vital signs in last 24 hours: Temp:  [97.5 F (36.4 C)-98.3 F (36.8 C)] 98.3 F (36.8 C) (05/01 0347) Pulse Rate:  [74-100] 75 (05/01 0347) Resp:  [9-20] 14 (05/01 0400) BP: (109-138)/(57-112) 109/92 mmHg (05/01 0347) SpO2:  [96 %-100 %] 98 % (05/01 0400) Weight:  [89.721 kg (197 lb 12.8 oz)] 89.721 kg (197 lb 12.8 oz) (05/01 0347) Weight change:  Last BM Date: 03/30/15  Intake/Output from previous day: 04/30 70105/01 0700 In: 1324.6 [P.O.:1320; I.V.:4.6] Out: -  Intake/Output this shift:    General appearance: alert, cooperative and no distress Head: Normocephalic, without obvious abnormality, atraumatic Eyes: conjunctivae/corneas clear. PERRL, EOM's intact. Fundi benign. Neck: no adenopathy, no carotid bruit, no JVD, supple, symmetrical, trachea midline and thyroid not enlarged, symmetric, no tenderness/mass/nodules Back: symmetric, no curvature. ROM normal. No CVA tenderness. Resp: clear to auscultation bilaterally Cardio: regular rate and rhythm, S1, S2 normal, no murmur, click, rub or gallop GI: soft, non-tender; bowel sounds normal; no masses,  no organomegaly Extremities: extremities normal, atraumatic, no cyanosis or edema Pulses: 2+ and symmetric Skin: Skin color, texture, turgor normal. No rashes or lesions Neurologic: Grossly normal  Lab Results:  Recent Labs  03/02/15 0548  WBC 11.3*  HGB 8.7*  HCT 24.9*  PLT 494*   BMET  Recent Labs  03/02/15 0548  NA 138  K 3.7  CL 108  CO2 23  GLUCOSE 95  BUN <5*  CREATININE 0.46  CALCIUM 8.9     Studies/Results: No results found.  Medications: I have reviewed the patient's current medications.  Assessment/Plan: A 24 year old female admitted with sickle cell painful crisis and pneumonia.  #1 pneumonia: Continue IV Levaquin.  Will treat for 10 days total since patient has ahd a break in her treatment days. We will transition her to oral Levaquin at discharge.  #2 sickle cell painful crisis: Will keep her on the oral medications and the dilaudid PCA for 1 more day. Mobilize her today and Ibuprofen.  #3 sickle cell anemia: Continue to monitor H&H which is stable  #4 medication noncompliance: Patient has been counselled.    LOS: 3 days   GARBA,LAWAL 03/02/2015, 5:33 PM

## 2015-03-03 NOTE — Progress Notes (Signed)
Nutrition Brief Note  Patient identified on the Malnutrition Screening Tool (MST) Report  Wt Readings from Last 15 Encounters:  03/03/15 197 lb 12.8 oz (89.721 kg)  02/21/15 184 lb 4.8 oz (83.598 kg)  05/16/14 198 lb 6.6 oz (90 kg)  03/28/14 186 lb 12.8 oz (84.732 kg)  01/29/14 135 lb (61.236 kg)  11/12/13 183 lb 9.6 oz (83.28 kg)  11/10/13 170 lb (77.111 kg)  11/02/13 160 lb (72.576 kg)  09/03/13 170 lb (77.111 kg)  06/12/13 195 lb (88.451 kg)  06/10/13 195 lb (88.451 kg)  09/22/12 205 lb (92.987 kg) (98 %*, Z = 2.02)  10/13/12 190 lb (86.183 kg) (96 %*, Z = 1.78)  10/08/12 180 lb (81.647 kg) (95 %*, Z = 1.60)  09/14/12 170 lb (77.111 kg) (92 %*, Z = 1.39)   * Growth percentiles are based on CDC 2-20 Years data.    Body mass index is 35.05 kg/(m^2). Patient meets criteria for obesity based on current BMI.   Current diet order is Regular, patient is consuming approximately 100% of meals at this time. Labs and medications reviewed.   Spoke with pt who reports very good appetite since admission. She states that the few days she was home between hospitalizations she had a good appetite and was eating well. She states that her weight has been stable recently.  No nutrition interventions warranted at this time. If nutrition issues arise, please consult RD.    Jarome Matin, RD, LDN Inpatient Clinical Dietitian Pager # [phone] After hours/weekend pager # [phone]

## 2015-03-04 DIAGNOSIS — Z9119 Patient's noncompliance with other medical treatment and regimen: Secondary | ICD-10-CM

## 2015-03-04 LAB — CBC WITH DIFFERENTIAL/PLATELET
BASOS ABS: 0 10*3/uL (ref 0.0–0.1)
BASOS PCT: 0 % (ref 0–1)
EOS ABS: 0.1 10*3/uL (ref 0.0–0.7)
Eosinophils Relative: 1 % (ref 0–5)
HCT: 24.9 % — ABNORMAL LOW (ref 36.0–46.0)
Hemoglobin: 8.9 g/dL — ABNORMAL LOW (ref 12.0–15.0)
Lymphocytes Relative: 43 % (ref 12–46)
Lymphs Abs: 4.6 10*3/uL — ABNORMAL HIGH (ref 0.7–4.0)
MCH: 30.1 pg (ref 26.0–34.0)
MCHC: 35.7 g/dL (ref 30.0–36.0)
MCV: 84.1 fL (ref 78.0–100.0)
Monocytes Absolute: 1.1 10*3/uL — ABNORMAL HIGH (ref 0.1–1.0)
Monocytes Relative: 10 % (ref 3–12)
NEUTROS ABS: 4.9 10*3/uL (ref 1.7–7.7)
NEUTROS PCT: 46 % (ref 43–77)
PLATELETS: 607 10*3/uL — AB (ref 150–400)
RBC: 2.96 MIL/uL — ABNORMAL LOW (ref 3.87–5.11)
RDW: 15.5 % (ref 11.5–15.5)
WBC: 10.8 10*3/uL — ABNORMAL HIGH (ref 4.0–10.5)

## 2015-03-04 LAB — COMPREHENSIVE METABOLIC PANEL
ALBUMIN: 3.6 g/dL (ref 3.5–5.0)
ALK PHOS: 44 U/L (ref 38–126)
ALT: 23 U/L (ref 14–54)
ANION GAP: 6 (ref 5–15)
AST: 23 U/L (ref 15–41)
BUN: 5 mg/dL — ABNORMAL LOW (ref 6–20)
CHLORIDE: 105 mmol/L (ref 101–111)
CO2: 27 mmol/L (ref 22–32)
Calcium: 9.4 mg/dL (ref 8.9–10.3)
Creatinine, Ser: 0.46 mg/dL (ref 0.44–1.00)
GFR calc Af Amer: >60 mL/min (ref >60)
GFR calc non Af Amer: >60 mL/min (ref >60)
Glucose, Bld: 93 mg/dL (ref 70–99)
POTASSIUM: 3.5 mmol/L (ref 3.5–5.1)
Sodium: 138 mmol/L (ref 135–145)
Total Bilirubin: 1.3 mg/dL — ABNORMAL HIGH (ref 0.3–1.2)
Total Protein: 7 g/dL (ref 6.5–8.1)

## 2015-03-04 MED ORDER — HYDROCODONE-ACETAMINOPHEN 10-325 MG PO TABS
1.0000 | ORAL_TABLET | ORAL | Status: DC
  Administered 2015-03-04 – 2015-03-05 (×6): 1 via ORAL
  Filled 2015-03-04 (×6): qty 1

## 2015-03-04 NOTE — Progress Notes (Signed)
24 Hour Totals: 2000-0800 (12h) 3 mg  Demands: 12 Delivered: 10 Riannah Stagner, Barbee Shropshire, RN

## 2015-03-04 NOTE — Care Management Note (Addendum)
Case Management Note  Patient Details  Name: Carmen [name] MRN: [id number] Date of Birth: December 20, 1990  Subjective/Objective:    24 yo female admitted with [facility]                Action/Plan: 03/05/15 Received call from patient's counselor from the [place], [place], and she stated that they have tried to connect the patient to the [facility] with Dr. Zigmund Daniel but the patient has been cancelling her appointments. Discussed that the patient stated she is interested in establishing care with Dr. Marlou Sa at [facility] [place] but she stated that he is not accepting new patients until July. Marguirite requested that I request the patient to call her for follow up in helping to establish care with a new provider. Spoke with the patient and discussed this information. The patient became angry stating that her statement that she "could not afford her medications" has been taken out of context and that she can afford her medications and she does not want any further assistance.  Spoke with patient and she stated that she contacted her PCP Friday for follow up appointment and medication refill and that since she was not able to get a narcotic refill from her PCP she returned due to [facility]. Although upon previous discharge the patient stated that she did not need a narcotic prescription because she had one to pick up at her pharmacy, [facility] on [address]. Patient stated she is going to try to establish care with another PCP. Explained to the patient that a message was left for her counselor to discuss follow up. Patient stated her counselor has not been returning her calls. Will continue to follow  Patient stating she was unable to afford discharge medications post previous discharge. This CM had contacted Marcial Pacas, [employer] [facility] during previous admit. Juliann Pulse stated she will assist patient upon discharge to afford medications until her [facility] counselor can work with patient and PCP to activate [facility]  tracks card for affordability of care and medications. Patient was informed to contact Juliann Pulse upon discharge for financial assistance with medications. This CM had also provided patient with coupons in which she stated she could then afford the discharge medications. This CM left a message for Cheri Rous, the patient counselor at the [facility] agency to follow up on status of [place] tracks crad and affordability. Will continue to follow  Expected Discharge Date:   (unknown)               Expected Discharge Plan:  Home/Self Care  In-House Referral:  NA  Discharge planning Services  CM Consult  Post Acute Care Choice:  NA Choice offered to:     DME Arranged:    DME Agency:     HH Arranged:    HH Agency:     Status of Service:  In process, will continue to follow  Medicare Important Message Given:    Date Medicare IM Given:    Medicare IM give by:    Date Additional Medicare IM Given:    Additional Medicare Important Message give by:     If discussed at [place] of Stay Meetings, dates discussed:    Additional Comments:  Scot Dock, RN 03/04/2015, 1:41 PM

## 2015-03-04 NOTE — Progress Notes (Signed)
SICKLE CELL SERVICE PROGRESS NOTE  ASMI FUGERE OZH:[id number] DOB: 1991/04/24 DOA: 02/28/2015 PCP: [name], MD  Assessment/Plan: Active Problems:   Sickle cell pain crisis   Leukocytosis   Atypical chest pain   HCAP (healthcare-associated pneumonia)   Sickle cell crisis  1. 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 Pt was hospitalized 3 days post-discharge because she could no obtain her medications secondary to financial constraints. She still has been unable to obtain her pain medications for out patient use.  2. Pneumonia: This is the 3rd admission in 7 days for Ms. Wilcher who left the hospital AMA on her last admission. During the second hospitalization she was diagnosed with a Pneumonia and started on antibiotics. She then left the hospital AMA while arrangements were being made between the hospital and the [facility] [employer] [facility] to assist with obtaining her antibiotics and oral analgesics. She has now returned a 3rd time because she had neither antibiotics or oral analgesics and is now back with both crisis and sequela from her pneumonia. Today she will complete a total of 6/8 days of antibiotics. She has no fevers or hypoxia at present and will continue 2 more days of Levaquin to complete her course. Anticipate discharge tomorrow. 3. Leukocytosis: Pt has a mild leukocytosis which is likely related to early pneumonia.  Will continue to monitor. 4. Anemia of chronic disease: Per patients report Hb stable. 5. Thrombocytosis: I suspect that this is related to her Hb SS. I anticipate that she will have a decrease in platelet count once she is on Hydrea. However in the mean timw I will start on a daily ASA 81 mg and she will need  re-assessment in the out patient setting. 6. Medical Non-compliance: Pt was hospitalized 3 days post-discharge because she could no obtain her medications secondary to financial constraints.  During the second hospitalization she was diagnosed with a Pneumonia and started on antibiotics. She then left the hospital AMA while arrangements were being made between the hospital and the [facility] [employer] [facility] to assist with obtaining her antibiotics and oral analgesics. She has now returned a 3rd time because she had neither antibiotics or oral analgesics and is now back with both crisis and sequela from her pneumonia.    Code Status: Full Code Family Communication: N/A Disposition Plan: Anticipate discharge tomorrow.  Zhion Pevehouse A.  Pager [phone]. If 7PM-7AM, please contact night-coverage.  03/04/2015, 9:42 AM  LOS: 4 days    Consultants:  None  Procedures:  None  Antibiotics:  Cefepime 4/26 >>4/27  Vancomycin 4/26>>4/27  Levaquin 4/27 >> 4/28  Levaquin 4/28 >>  HPI/Subjective: Pt reports that she continued to have pain in her legs at a level of 8/10. She reports her last BM as yesterday. She has no other complaints today. Discussed possible discharge tomorrow.  Objective: Filed Vitals:   03/04/15 0000 03/04/15 0212 03/04/15 0400 03/04/15 0800  BP:  112/63    Pulse:  89    Temp:  98 F (36.7 C)    TempSrc:  Oral    Resp: 16 18 16 18   Height:      Weight:      SpO2: 99% 99% 99% 100%   Weight change:  No intake or output data in the 24 hours ending 03/04/15 0942  General: Alert, awake, oriented x3, in no acute distress. Well appearing HEENT: [place]/AT PEERL, EOMI, anicteric OROPHARYNX:  Moist, No  exudate/ erythema/lesions.  Heart: Regular rate and rhythm, without murmurs, rubs, gallops, PMI non-displaced, no heaves or thrills on palpation.  Lungs: Clear to auscultation, no wheezing or rhonchi noted. No increased vocal fremitus resonant to percussion  Abdomen: Soft, nontender, nondistended, positive bowel sounds, no masses no hepatosplenomegaly noted.  Neuro: No focal neurological deficits noted cranial nerves II through XII grossly intact.  Strength  functional in bilateral upper and lower extremities. Musculoskeletal: No warm swelling or erythema around joints, no spinal tenderness noted. Psychiatric: Patient alert and oriented x3, good insight and cognition, good recent to remote recall.  Data Reviewed: Basic Metabolic Panel:  Recent Labs Lab 02/26/15 0540 02/28/15 1511 03/01/15 0621 03/02/15 0548 03/03/15 0539 03/04/15 0520  NA 139 141 139 138 139 138  K 3.6 3.6 3.3* 3.7 3.4* 3.5  CL 107 107 107 108 105 105  CO2 23 23 25 23 28 27   GLUCOSE 86 83 97 95 93 93  BUN 6 12 6  <5* 8 5*  CREATININE 0.38* 0.58 0.44* 0.46 0.45 0.46  CALCIUM 9.3 9.7 8.9 8.9 9.4 9.4  MG 1.8  --  1.7  --   --   --   PHOS  --   --  4.2  --   --   --    Liver Function Tests:  Recent Labs Lab 02/28/15 1511 03/01/15 0621 03/02/15 0548 03/03/15 0539 03/04/15 0520  AST 29 20 26 28 23   ALT 29 20 21 26 23   ALKPHOS 59 46 53 50 44  BILITOT 2.1* 1.4* 0.9 1.0 1.3*  PROT 8.4* 6.9 6.9 6.8 7.0  ALBUMIN 4.6 3.6 3.6 3.8 3.6   No results for input(s): LIPASE, AMYLASE in the last 168 hours. No results for input(s): AMMONIA in the last 168 hours. CBC:  Recent Labs Lab 02/28/15 1511 03/01/15 0621 03/02/15 0548 03/03/15 0539 03/04/15 0520  WBC 14.4* 14.5* 11.3* 11.1* 10.8*  NEUTROABS 8.8* 6.1 4.1 4.4 4.9  HGB 9.2* 7.9* 8.7* 8.8* 8.9*  HCT 26.3* 22.7* 24.9* 26.1* 24.9*  MCV 84.6 85.0 85.0 85.3 84.1  PLT 646* 554* 494* 569* 607*   Cardiac Enzymes: No results for input(s): CKTOTAL, CKMB, CKMBINDEX, TROPONINI in the last 168 hours. BNP (last 3 results) No results for input(s): BNP in the last 8760 hours.  ProBNP (last 3 results) No results for input(s): PROBNP in the last 8760 hours.  CBG: No results for input(s): GLUCAP in the last 168 hours.  No results found for this or any previous visit (from the past 240 hour(s)).   Studies: Dg Chest 2 View  02/28/2015   CLINICAL DATA:  Chest pain; sickle cell crisis  EXAM: CHEST  2 VIEW  COMPARISON:   Chest radiograph and chest CT February 25, 2015  FINDINGS: Subtle patchy opacity is noted in both upper lobes, suspicious for patchy pneumonia. Lungs elsewhere appear clear. The heart size and pulmonary vascularity are normal. No adenopathy. Several endplate defects are noted in the thoracic spine, upright representing infarcts from known sickle cell disease.  IMPRESSION: Subtle patchy opacity in each upper lobe. Lungs elsewhere clear. No evidence of volume overload.   Electronically Signed   By: Lowella Grip III M.D.   On: 02/28/2015 19:53   Dg Chest 2 View  02/25/2015   CLINICAL DATA:  Sickle cell pain crisis.  Mid chest pain.  EXAM: CHEST  2 VIEW  COMPARISON:  11/28/2014  FINDINGS: Prominent markings in the right lung base are stable since prior study, likely scarring. Left  lung clear. Heart is normal size. No effusions or acute airspace opacities. No acute bony abnormality.  IMPRESSION: No active cardiopulmonary disease.   Electronically Signed   By: Rolm Baptise M.D.   On: 02/25/2015 12:04   Ct Angio Chest Pe W/cm &/or Wo Cm  02/25/2015   CLINICAL DATA:  Two day history of chest tightness and shortness of breath. Sickle cell disease.  EXAM: CT ANGIOGRAPHY CHEST WITH CONTRAST  TECHNIQUE: Multidetector CT imaging of the chest was performed using the standard protocol during bolus administration of intravenous contrast. Multiplanar CT image reconstructions and MIPs were obtained to evaluate the vascular anatomy.  CONTRAST:  100 mL Omnipaque 350 nonionic  COMPARISON:  Chest CT February 03, 2014; chest radiograph February 25, 2015  FINDINGS: There is no demonstrable pulmonary embolus. There is no thoracic aortic aneurysm or dissection.  Several focal areas of opacity are noted in the superior segment of the left lower lobe consistent with multifocal pneumonia. A small area of pneumonia is also noted in the posterior segment of the left lower lobe. There is patchy atelectasis in both lung bases, slightly more on the  right than on the left. Mild atelectatic changes also noted in the inferior aspect of the right middle lobe and lingula. A small amount of ill-defined opacity is noted in the posterior segment right upper lobe consistent with either atelectasis or the earliest changes of pneumonia in this area.  There is residual thymic tissue, normal for age. There is no appreciable thoracic adenopathy. The pericardium is not thickened. Thyroid appears unremarkable.  In the visualized upper abdomen, there is evidence of previous splenic infarct. There is hepatic steatosis.  Bony structures show evidence of sickle cell disease with areas of thoracic end plate infarcts as well as patchy areas of sclerotic change.  Review of the MIP images confirms the above findings.  IMPRESSION: Areas of pneumonia in the left lower lobe, primarily in the superior segment. Suspect early pneumonia in the posterior segment of the right upper lobe. There are areas of patchy atelectasis as outlined above.  No demonstrable pulmonary embolus.  Bony changes of sickle cell disease. Status post splenic infarct. Hepatic steatosis present.   Electronically Signed   By: Lowella Grip III M.D.   On: 02/25/2015 20:04    Scheduled Meds: . aspirin EC  81 mg Oral Daily  . enoxaparin (LOVENOX) injection  40 mg Subcutaneous Q24H  . folic acid  1 mg Oral Daily  . HYDROcodone-acetaminophen  1 tablet Oral Q4H  . HYDROmorphone PCA 0.3 mg/mL   Intravenous 6 times per day  . hydroxyurea  500 mg Oral Q breakfast  . levofloxacin  750 mg Oral QHS  . loratadine  10 mg Oral Daily  . senna-docusate  1 tablet Oral BID   Continuous Infusions:    Time spent 35 minutes.

## 2015-03-05 MED ORDER — HYDROCODONE-ACETAMINOPHEN 10-325 MG PO TABS
1.0000 | ORAL_TABLET | ORAL | Status: AC
Start: 2015-03-05 — End: 2015-03-05
  Administered 2015-03-05: 1 via ORAL
  Filled 2015-03-05: qty 1

## 2015-03-05 MED ORDER — HYDROMORPHONE HCL 2 MG/ML IJ SOLN
2.0000 mg | INTRAMUSCULAR | Status: DC | PRN
  Administered 2015-03-05 – 2015-03-06 (×4): 2 mg via INTRAVENOUS
  Filled 2015-03-05 (×4): qty 1

## 2015-03-05 MED ORDER — HYDROMORPHONE HCL 2 MG/ML IJ SOLN
2.0000 mg | Freq: Once | INTRAMUSCULAR | Status: AC
  Administered 2015-03-05: 2 mg via INTRAVENOUS
  Filled 2015-03-05: qty 1

## 2015-03-05 MED ORDER — HYDROCODONE-ACETAMINOPHEN 10-325 MG PO TABS
2.0000 | ORAL_TABLET | ORAL | Status: DC
  Administered 2015-03-05 – 2015-03-06 (×5): 2 via ORAL
  Filled 2015-03-05 (×5): qty 2

## 2015-03-05 NOTE — Progress Notes (Signed)
Pt O2 sats on RA 95%, ambulating on RA 94%.

## 2015-03-05 NOTE — Progress Notes (Signed)
SICKLE CELL SERVICE PROGRESS NOTE  Carmen [name] WER:[id number] DOB: [birth year] DOA: 02/28/2015 PCP: [name], MD  Assessment/Plan: Active Problems:   Sickle cell pain crisis   Leukocytosis   Atypical chest pain   HCAP (healthcare-associated pneumonia)   Sickle cell crisis  1. 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 Pt still rating her pain as 8/10 and localized to her legs. Will increase her Norco 5-325 mg to 2 tabs every 4 hours and continue PCA as PRN. Will re-assess in 4 hours and if not using much on the PCA will discontinue the PCA and give clinician assisted doses for breakthrough pain in anticipation of discharge tomorrow. 2. Pneumonia: This is the 3rd admission in 7 days for Carmen [name] who left the hospital AMA on her last admission. During the second hospitalization she was diagnosed with a Pneumonia and started on antibiotics. She then left the hospital AMA while arrangements were being made between the hospital and the [facility] to assist with obtaining her antibiotics and oral analgesics. She has now returned a 3rd time because she had neither antibiotics or oral analgesics and is now back with both crisis and sequela from her pneumonia. Today she will complete a total of 6/8 days of antibiotics. She has no fevers or hypoxia at present and will continue 1 more day of Levaquin to complete her course. Anticipate discharge tomorrow. 3. Leukocytosis: Pt has a mild leukocytosis which is likely related to early pneumonia.  Will continue to monitor. 4. Anemia of chronic disease: Per patients report Hb stable. 5. Thrombocytosis: I suspect that this is related to her Hb SS. I anticipate that she will have a decrease in platelet count once she is on Hydrea. However in the mean timw I will start on a daily ASA 81 mg and she will need   re-assessment in the out patient setting. 6. Medical Non-compliance: Pt was hospitalized 3 days post-discharge because she could no obtain her medications secondary to financial constraints. During the second hospitalization she was diagnosed with a Pneumonia and started on antibiotics. She then left the hospital AMA while arrangements were being made between the hospital and the [facility] [employer] [facility] to assist with obtaining her antibiotics and oral analgesics. She has now returned a 3rd time because she had neither antibiotics or oral analgesics and is now back with both crisis and sequela from her pneumonia.  7. Psychosocial: Pt is stressed out as she anticipates starting work on 03/13/2015 and just worn out from the chronicity of her disease. However she is clear that she does not want any assistance from [facility]) in resolving any of her social issues.  Code Status: Full Code Family Communication: N/A Disposition Plan: Anticipate discharge tomorrow.  Ciaira Natividad A.  Pager [phone]. If 7PM-7AM, please contact night-coverage.  03/05/2015, 4:30 PM  LOS: 5 days    Consultants:  None  Procedures:  None  Antibiotics:  Cefepime 4/26 >>4/27  Vancomycin 4/26>>4/27  Levaquin 4/27 >> 4/28  Levaquin 4/28 >>  HPI/Subjective: Pt reports that she continued to have pain in her legs at a level of 8/10. She reports her last BM as yesterday. She has no other complaints today. Discussed possible discharge tomorrow.  Objective: Filed Vitals:   03/05/15 1109 03/05/15 1200 03/05/15 1414 03/05/15 1519  BP:  129/67   Pulse:   86   Temp:   97 F (36.1 C)   TempSrc:   Oral   Resp:  10 14 11   Height:      Weight:      SpO2: 95% 99% 100% 100%   Weight change:   Intake/Output Summary (Last 24 hours) at 03/05/15 1630 Last data filed at 03/05/15 0711  Gross per 24 hour  Intake    240 ml  Output   1450 ml  Net  -1210 ml    General: Alert, awake, oriented x3, in  no acute distress. Well appearing HEENT: [place]/AT PEERL, EOMI, anicteric OROPHARYNX:  Moist, No exudate/ erythema/lesions.  Heart: Regular rate and rhythm, without murmurs, rubs, gallops, PMI non-displaced, no heaves or thrills on palpation.  Lungs: Clear to auscultation, no wheezing or rhonchi noted. No increased vocal fremitus resonant to percussion  Abdomen: Soft, nontender, nondistended, positive bowel sounds, no masses no hepatosplenomegaly noted.  Neuro: No focal neurological deficits noted cranial nerves II through XII grossly intact.  Strength functional in bilateral upper and lower extremities. Musculoskeletal: No warm swelling or erythema around joints, no spinal tenderness noted. Psychiatric: Patient alert and oriented x3, good insight and cognition, good recent to remote recall.  Data Reviewed: Basic Metabolic Panel:  Recent Labs Lab 02/28/15 1511 03/01/15 0621 03/02/15 0548 03/03/15 0539 03/04/15 0520  NA 141 139 138 139 138  K 3.6 3.3* 3.7 3.4* 3.5  CL 107 107 108 105 105  CO2 23 25 23 28 27   GLUCOSE 83 97 95 93 93  BUN 12 6 <5* 8 5*  CREATININE 0.58 0.44* 0.46 0.45 0.46  CALCIUM 9.7 8.9 8.9 9.4 9.4  MG  --  1.7  --   --   --   PHOS  --  4.2  --   --   --    Liver Function Tests:  Recent Labs Lab 02/28/15 1511 03/01/15 0621 03/02/15 0548 03/03/15 0539 03/04/15 0520  AST 29 20 26 28 23   ALT 29 20 21 26 23   ALKPHOS 59 46 53 50 44  BILITOT 2.1* 1.4* 0.9 1.0 1.3*  PROT 8.4* 6.9 6.9 6.8 7.0  ALBUMIN 4.6 3.6 3.6 3.8 3.6   No results for input(s): LIPASE, AMYLASE in the last 168 hours. No results for input(s): AMMONIA in the last 168 hours. CBC:  Recent Labs Lab 02/28/15 1511 03/01/15 0621 03/02/15 0548 03/03/15 0539 03/04/15 0520  WBC 14.4* 14.5* 11.3* 11.1* 10.8*  NEUTROABS 8.8* 6.1 4.1 4.4 4.9  HGB 9.2* 7.9* 8.7* 8.8* 8.9*  HCT 26.3* 22.7* 24.9* 26.1* 24.9*  MCV 84.6 85.0 85.0 85.3 84.1  PLT 646* 554* 494* 569* 607*   Cardiac Enzymes: No  results for input(s): CKTOTAL, CKMB, CKMBINDEX, TROPONINI in the last 168 hours. BNP (last 3 results) No results for input(s): BNP in the last 8760 hours.  ProBNP (last 3 results) No results for input(s): PROBNP in the last 8760 hours.  CBG: No results for input(s): GLUCAP in the last 168 hours.  No results found for this or any previous visit (from the past 240 hour(s)).   Studies: Dg Chest 2 View  02/28/2015   CLINICAL DATA:  Chest pain; sickle cell crisis  EXAM: CHEST  2 VIEW  COMPARISON:  Chest radiograph and chest CT February 25, 2015  FINDINGS: Subtle patchy opacity is noted in both upper lobes, suspicious for patchy pneumonia. Lungs elsewhere appear clear. The heart size and pulmonary vascularity are normal. No adenopathy.  Several endplate defects are noted in the thoracic spine, upright representing infarcts from known sickle cell disease.  IMPRESSION: Subtle patchy opacity in each upper lobe. Lungs elsewhere clear. No evidence of volume overload.   Electronically Signed   By: Lowella Grip III M.D.   On: 02/28/2015 19:53   Dg Chest 2 View  02/25/2015   CLINICAL DATA:  Sickle cell pain crisis.  Mid chest pain.  EXAM: CHEST  2 VIEW  COMPARISON:  11/28/2014  FINDINGS: Prominent markings in the right lung base are stable since prior study, likely scarring. Left lung clear. Heart is normal size. No effusions or acute airspace opacities. No acute bony abnormality.  IMPRESSION: No active cardiopulmonary disease.   Electronically Signed   By: Rolm Baptise M.D.   On: 02/25/2015 12:04   Ct Angio Chest Pe W/cm &/or Wo Cm  02/25/2015   CLINICAL DATA:  Two day history of chest tightness and shortness of breath. Sickle cell disease.  EXAM: CT ANGIOGRAPHY CHEST WITH CONTRAST  TECHNIQUE: Multidetector CT imaging of the chest was performed using the standard protocol during bolus administration of intravenous contrast. Multiplanar CT image reconstructions and MIPs were obtained to evaluate the vascular  anatomy.  CONTRAST:  100 mL Omnipaque 350 nonionic  COMPARISON:  Chest CT February 03, 2014; chest radiograph February 25, 2015  FINDINGS: There is no demonstrable pulmonary embolus. There is no thoracic aortic aneurysm or dissection.  Several focal areas of opacity are noted in the superior segment of the left lower lobe consistent with multifocal pneumonia. A small area of pneumonia is also noted in the posterior segment of the left lower lobe. There is patchy atelectasis in both lung bases, slightly more on the right than on the left. Mild atelectatic changes also noted in the inferior aspect of the right middle lobe and lingula. A small amount of ill-defined opacity is noted in the posterior segment right upper lobe consistent with either atelectasis or the earliest changes of pneumonia in this area.  There is residual thymic tissue, normal for age. There is no appreciable thoracic adenopathy. The pericardium is not thickened. Thyroid appears unremarkable.  In the visualized upper abdomen, there is evidence of previous splenic infarct. There is hepatic steatosis.  Bony structures show evidence of sickle cell disease with areas of thoracic end plate infarcts as well as patchy areas of sclerotic change.  Review of the MIP images confirms the above findings.  IMPRESSION: Areas of pneumonia in the left lower lobe, primarily in the superior segment. Suspect early pneumonia in the posterior segment of the right upper lobe. There are areas of patchy atelectasis as outlined above.  No demonstrable pulmonary embolus.  Bony changes of sickle cell disease. Status post splenic infarct. Hepatic steatosis present.   Electronically Signed   By: Lowella Grip III M.D.   On: 02/25/2015 20:04    Scheduled Meds: . aspirin EC  81 mg Oral Daily  . enoxaparin (LOVENOX) injection  40 mg Subcutaneous Q24H  . folic acid  1 mg Oral Daily  . HYDROcodone-acetaminophen  2 tablet Oral Q4H  . HYDROmorphone PCA 0.3 mg/mL   Intravenous 6  times per day  . hydroxyurea  500 mg Oral Q breakfast  . levofloxacin  750 mg Oral QHS  . loratadine  10 mg Oral Daily  . senna-docusate  1 tablet Oral BID   Continuous Infusions:    Time spent 40 minutes. Greater than 50% of the time spent in discussing her acute condition.

## 2015-03-05 NOTE — Progress Notes (Signed)
PCA use 12 hr: 4.5mg  Demand: 16 Delivered: 15 Patient rates pain 9/10. Alert and verbal.

## 2015-03-06 ENCOUNTER — Ambulatory Visit: Payer: Medicaid Other | Admitting: Internal Medicine

## 2015-03-06 NOTE — Discharge Summary (Signed)
Physician Discharge Summary  Patient ID: Carmen [name] MRN: [id number] DOB/AGE: 03-20-1991 24 y.o.  Admit date: 02/28/2015 Discharge date: 03/06/2015  Admission Diagnoses:  Discharge Diagnoses:  Active Problems:   Sickle cell pain crisis   Leukocytosis   Atypical chest pain   HCAP (healthcare-associated pneumonia)   Sickle cell crisis   Discharged Condition: good  Hospital Course: Patient was admitted with sickle cell painful crisis and HCAP. She has had recurrent hospitalizations for this pneumonia within the last one week. She left AMA twice and has to come back to complete her treatment. We have treated her with Levaquin and today is day 8 of 8 and she is getting the treatment in the hospital before she leaves. Patient has also had IV Dilaudid PCA with Toradol. She is being discharged home on her home regimen. She has not been able to get her home medications apparently due to cost and not being able to reach her physician. His social services has been involved including the sickle cell agency to assist patient. Patient is aware that this is all protocol to help her so she can get her medications and take them at home. She is doing much better at the time of discharge and she'll follow up with her primary care physician within the next 1-2 weeks.  Consults: None  Significant Diagnostic Studies: labs: CBCs and CMPs checked. No transfusion required.  Treatments: IV hydration, antibiotics: Levaquin and analgesia: Dilaudid  Discharge Exam: Blood pressure 116/53, pulse 89, temperature 99.3 F (37.4 C), temperature source Oral, resp. rate 20, height 5\' 3"  (1.6 m), weight 89.721 kg (197 lb 12.8 oz), last menstrual period 02/13/2015, SpO2 100 %. General appearance: alert, cooperative and no distress Head: Normocephalic, without obvious abnormality, atraumatic Eyes: conjunctivae/corneas clear. PERRL, EOM's intact. Fundi benign. Neck: no adenopathy, no carotid bruit, no JVD, supple,  symmetrical, trachea midline and thyroid not enlarged, symmetric, no tenderness/mass/nodules Back: symmetric, no curvature. ROM normal. No CVA tenderness. Resp: clear to auscultation bilaterally Chest wall: no tenderness Cardio: regular rate and rhythm, S1, S2 normal, no murmur, click, rub or gallop GI: soft, non-tender; bowel sounds normal; no masses,  no organomegaly Extremities: extremities normal, atraumatic, no cyanosis or edema Pulses: 2+ and symmetric Skin: Skin color, texture, turgor normal. No rashes or lesions Neurologic: Grossly normal  Disposition: 01-Home or Self Care     Medication List    TAKE these medications        aspirin 81 MG EC tablet  Take 1 tablet (81 mg total) by mouth daily.     cetirizine 10 MG tablet  Commonly known as:  ZYRTEC  Take 10 mg by mouth daily.     folic acid 1 MG tablet  Commonly known as:  FOLVITE  Take 1 tablet (1 mg total) by mouth daily.     HYDROcodone-acetaminophen 10-325 MG per tablet  Commonly known as:  NORCO  Take 1-2 tablets by mouth every 4 (four) hours as needed (for pain).     hydroxyurea 500 MG capsule  Commonly known as:  HYDREA  Take 1 capsule (500 mg total) by mouth daily. May take with food to minimize GI side effects.     ibuprofen 200 MG tablet  Commonly known as:  ADVIL,MOTRIN  Take 400-600 mg by mouth every 6 (six) hours as needed for moderate pain.     levofloxacin 750 MG tablet  Commonly known as:  LEVAQUIN  Take 1 tablet (750 mg total) by mouth daily.  Follow-up Information    Follow up with [name], MD.   Specialty:  Family Medicine   Why:  Follow up with your primary care provider for ongoing healthcare needs including medication refills.   Contact information:   [place] [address] [phone]       Follow up with [facility].   Specialty:  Emergency Medicine   Why:  Return to ER If symptoms worsen including  severe chest pain or difficulty breathing.    Contact information:   [address] [id number] [place] [phone]      Signed: Barbette Merino 03/06/2015, 10:32 AM  Time spent 32 minutes

## 2015-03-11 ENCOUNTER — Inpatient Hospital Stay (HOSPITAL_COMMUNITY)
Admission: EM | Admit: 2015-03-11 | Discharge: 2015-03-18 | DRG: 812 | Disposition: A | Discharge status: Home / Self Care | Payer: Medicaid Other | Attending: Internal Medicine | Admitting: Internal Medicine

## 2015-03-11 ENCOUNTER — Encounter (HOSPITAL_COMMUNITY): Payer: Self-pay

## 2015-03-11 DIAGNOSIS — E669 Obesity, unspecified: Secondary | ICD-10-CM | POA: Diagnosis present

## 2015-03-11 DIAGNOSIS — Z7982 Long term (current) use of aspirin: Secondary | ICD-10-CM

## 2015-03-11 DIAGNOSIS — D638 Anemia in other chronic diseases classified elsewhere: Secondary | ICD-10-CM | POA: Diagnosis present

## 2015-03-11 DIAGNOSIS — D5700 Hb-SS disease with crisis, unspecified: Principal | ICD-10-CM | POA: Diagnosis present

## 2015-03-11 DIAGNOSIS — D571 Sickle-cell disease without crisis: Secondary | ICD-10-CM | POA: Diagnosis present

## 2015-03-11 DIAGNOSIS — Z713 Dietary counseling and surveillance: Secondary | ICD-10-CM | POA: Diagnosis present

## 2015-03-11 DIAGNOSIS — F329 Major depressive disorder, single episode, unspecified: Secondary | ICD-10-CM | POA: Diagnosis present

## 2015-03-11 DIAGNOSIS — Z9049 Acquired absence of other specified parts of digestive tract: Secondary | ICD-10-CM | POA: Diagnosis present

## 2015-03-11 DIAGNOSIS — Z6833 Body mass index (BMI) 33.0-33.9, adult: Secondary | ICD-10-CM

## 2015-03-11 DIAGNOSIS — M545 Low back pain: Secondary | ICD-10-CM | POA: Diagnosis present

## 2015-03-11 DIAGNOSIS — D72829 Elevated white blood cell count, unspecified: Secondary | ICD-10-CM | POA: Diagnosis present

## 2015-03-11 DIAGNOSIS — O99019 Anemia complicating pregnancy, unspecified trimester: Secondary | ICD-10-CM

## 2015-03-11 DIAGNOSIS — E876 Hypokalemia: Secondary | ICD-10-CM | POA: Diagnosis not present

## 2015-03-11 DIAGNOSIS — Z888 Allergy status to other drugs, medicaments and biological substances status: Secondary | ICD-10-CM

## 2015-03-11 DIAGNOSIS — D473 Essential (hemorrhagic) thrombocythemia: Secondary | ICD-10-CM | POA: Diagnosis present

## 2015-03-11 DIAGNOSIS — G43909 Migraine, unspecified, not intractable, without status migrainosus: Secondary | ICD-10-CM | POA: Diagnosis present

## 2015-03-11 LAB — COMPREHENSIVE METABOLIC PANEL
ALT: 21 U/L (ref 14–54)
AST: 26 U/L (ref 15–41)
Albumin: 4.5 g/dL (ref 3.5–5.0)
Alkaline Phosphatase: 49 U/L (ref 38–126)
Anion gap: 11 (ref 5–15)
BUN: 6 mg/dL (ref 6–20)
CHLORIDE: 108 mmol/L (ref 101–111)
CO2: 22 mmol/L (ref 22–32)
Calcium: 9.7 mg/dL (ref 8.9–10.3)
Creatinine, Ser: 0.6 mg/dL (ref 0.44–1.00)
GFR calc Af Amer: >60 mL/min (ref >60)
Glucose, Bld: 90 mg/dL (ref 70–99)
Potassium: 3.2 mmol/L — ABNORMAL LOW (ref 3.5–5.1)
Sodium: 141 mmol/L (ref 135–145)
Total Bilirubin: 1.5 mg/dL — ABNORMAL HIGH (ref 0.3–1.2)
Total Protein: 8.5 g/dL — ABNORMAL HIGH (ref 6.5–8.1)

## 2015-03-11 LAB — URINALYSIS, ROUTINE W REFLEX MICROSCOPIC
BILIRUBIN URINE: NEGATIVE
Glucose, UA: NEGATIVE mg/dL
Hgb urine dipstick: NEGATIVE
Ketones, ur: NEGATIVE mg/dL
LEUKOCYTES UA: NEGATIVE
NITRITE: NEGATIVE
Protein, ur: NEGATIVE mg/dL
SPECIFIC GRAVITY, URINE: 1.016 (ref 1.005–1.030)
UROBILINOGEN UA: 1 mg/dL (ref 0.0–1.0)
pH: 6 (ref 5.0–8.0)

## 2015-03-11 LAB — CBC WITH DIFFERENTIAL/PLATELET
BASOS PCT: 1 % (ref 0–1)
Basophils Absolute: 0.1 10*3/uL (ref 0.0–0.1)
EOS ABS: 0.1 10*3/uL (ref 0.0–0.7)
Eosinophils Relative: 1 % (ref 0–5)
HCT: 25.6 % — ABNORMAL LOW (ref 36.0–46.0)
Hemoglobin: 9 g/dL — ABNORMAL LOW (ref 12.0–15.0)
LYMPHS ABS: 3.8 10*3/uL (ref 0.7–4.0)
Lymphocytes Relative: 42 % (ref 12–46)
MCH: 30.8 pg (ref 26.0–34.0)
MCHC: 35.2 g/dL (ref 30.0–36.0)
MCV: 87.7 fL (ref 78.0–100.0)
Monocytes Absolute: 1.1 10*3/uL — ABNORMAL HIGH (ref 0.1–1.0)
Monocytes Relative: 13 % — ABNORMAL HIGH (ref 3–12)
Neutro Abs: 3.9 10*3/uL (ref 1.7–7.7)
Neutrophils Relative %: 43 % (ref 43–77)
PLATELETS: 730 10*3/uL — AB (ref 150–400)
RBC: 2.92 MIL/uL — AB (ref 3.87–5.11)
RDW: 17.7 % — ABNORMAL HIGH (ref 11.5–15.5)
WBC: 8.9 10*3/uL (ref 4.0–10.5)

## 2015-03-11 LAB — RETICULOCYTES
RBC.: 2.92 MIL/uL — ABNORMAL LOW (ref 3.87–5.11)
Retic Count, Absolute: 353.3 10*3/uL — ABNORMAL HIGH (ref 19.0–186.0)
Retic Ct Pct: 12.1 % — ABNORMAL HIGH (ref 0.4–3.1)

## 2015-03-11 LAB — POC URINE PREG, ED: PREG TEST UR: NEGATIVE

## 2015-03-11 MED ORDER — ONDANSETRON HCL 4 MG/2ML IJ SOLN
4.0000 mg | Freq: Once | INTRAMUSCULAR | Status: AC
  Administered 2015-03-11: 4 mg via INTRAVENOUS
  Filled 2015-03-11: qty 2

## 2015-03-11 MED ORDER — HYDROMORPHONE HCL 2 MG/ML IJ SOLN
2.0000 mg | INTRAMUSCULAR | Status: AC | PRN
  Administered 2015-03-11 – 2015-03-12 (×3): 2 mg via INTRAVENOUS
  Filled 2015-03-11 (×3): qty 1

## 2015-03-11 MED ORDER — SODIUM CHLORIDE 0.9 % IV BOLUS (SEPSIS)
1000.0000 mL | Freq: Once | INTRAVENOUS | Status: AC
  Administered 2015-03-11: 1000 mL via INTRAVENOUS

## 2015-03-11 NOTE — ED Notes (Signed)
I attempted to collect labs and was unsuccessful. 

## 2015-03-11 NOTE — ED Notes (Signed)
While assessing and starting an IV, pt was using cell phone constantly.

## 2015-03-11 NOTE — ED Provider Notes (Signed)
CSN: [id number]     Arrival date & time 03/11/15  2127 History   First MD Initiated Contact with Patient 03/11/15 2251     Chief Complaint  Patient presents with  . Sickle Cell Pain Crisis     (Consider location/radiation/quality/duration/timing/severity/associated sxs/prior Treatment) Patient is a 24 y.o. female presenting with sickle cell pain.  Sickle Cell Pain Crisis Location:  Back Severity:  Moderate Onset quality:  Gradual Duration:  1 day Similar to previous crisis episodes: no   Timing:  Constant Progression:  Unchanged Chronicity:  New Sickle cell genotype:  SS Context: dehydration   Context: not alcohol consumption, not change in medication and not non-compliance   Relieved by:  Nothing Worsened by:  Nothing tried Associated symptoms: no chest pain, no nausea, no shortness of breath, no swelling of legs and no vomiting     Past Medical History  Diagnosis Date  . Sickle cell crisis   . Depression   . Headache(784.0)   . Abortion     05/2012   Past Surgical History  Procedure Laterality Date  . Splenectomy    . Cholecystectomy N/A 11/30/2014    Procedure: LAPAROSCOPIC CHOLECYSTECTOMY SINGLE SITE WITH INTRAOPERATIVE CHOLANGIOGRAM;  Surgeon: Michael Boston, MD;  Location: [facility];  Service: General;  Laterality: N/A;   Family History  Problem Relation Age of Onset  . Hypertension Mother   . Sickle cell anemia Sister   . Kidney disease Sister   . Arthritis Sister   . Sickle cell anemia Sister   . Sickle cell trait Sister   . Heart disease Maternal Aunt   . Heart disease Maternal Uncle    History  Substance Use Topics  . Smoking status: Never Smoker   . Smokeless tobacco: Never Used  . Alcohol Use: No   OB History    Gravida Para Term Preterm AB TAB SAB Ectopic Multiple Living   1    1          Review of Systems  Respiratory: Negative for shortness of breath.   Cardiovascular: Negative for chest pain.  Gastrointestinal: Negative for nausea and  vomiting.  All other systems reviewed and are negative.     Allergies  Other  Home Medications   Prior to Admission medications   Medication Sig Start Date End Date Taking? Authorizing Provider  cetirizine (ZYRTEC) 10 MG tablet Take 10 mg by mouth daily.    Yes Historical Provider, MD  HYDROcodone-acetaminophen (NORCO) 10-325 MG per tablet Take 1-2 tablets by mouth every 4 (four) hours as needed (for pain).   Yes Historical Provider, MD  ibuprofen (ADVIL,MOTRIN) 200 MG tablet Take 400-600 mg by mouth every 6 (six) hours as needed for moderate pain.   Yes Historical Provider, MD  aspirin EC 81 MG EC tablet Take 1 tablet (81 mg total) by mouth daily. Patient not taking: Reported on 02/28/2015 02/19/15   Leana Gamer, MD  folic acid (FOLVITE) 1 MG tablet Take 1 tablet (1 mg total) by mouth daily. Patient not taking: Reported on 02/28/2015 02/19/15   Leana Gamer, MD  hydroxyurea (HYDREA) 500 MG capsule Take 1 capsule (500 mg total) by mouth daily. May take with food to minimize GI side effects. Patient not taking: Reported on 02/28/2015 02/22/15   Doy Hutching, MD  levofloxacin (LEVAQUIN) 750 MG tablet Take 1 tablet (750 mg total) by mouth daily. Patient not taking: Reported on 02/28/2015 02/27/15   Elwyn Reach, MD   BP 105/68 mmHg  Pulse 98  Temp(Src) 98 F (36.7 C) (Oral)  Resp 16  Ht 5\' 3"  (1.6 m)  Wt 186 lb 6.4 oz (84.55 kg)  BMI 33.03 kg/m2  SpO2 99%  LMP 03/10/2015 Physical Exam  Constitutional: She is oriented to person, place, and time. She appears well-developed and well-nourished.  HENT:  Head: Normocephalic and atraumatic.  Right Ear: External ear normal.  Left Ear: External ear normal.  Eyes: Conjunctivae and EOM are normal. Pupils are equal, round, and reactive to light.  Neck: Normal range of motion. Neck supple.  Cardiovascular: Normal rate, regular rhythm, normal heart sounds and intact distal pulses.   Pulmonary/Chest: Effort normal and  breath sounds normal.  Abdominal: Soft. Bowel sounds are normal. There is no tenderness.  Musculoskeletal: Normal range of motion.  Neurological: She is alert and oriented to person, place, and time.  Skin: Skin is warm and dry.  Vitals reviewed.   ED Course  Procedures (including critical care time) Labs Review Labs Reviewed  CBC WITH DIFFERENTIAL/PLATELET - Abnormal; Notable for the following:    RBC 2.92 (*)    Hemoglobin 9.0 (*)    HCT 25.6 (*)    RDW 17.7 (*)    Platelets 730 (*)    Monocytes Relative 13 (*)    Monocytes Absolute 1.1 (*)    All other components within normal limits  COMPREHENSIVE METABOLIC PANEL - Abnormal; Notable for the following:    Potassium 3.2 (*)    Total Protein 8.5 (*)    Total Bilirubin 1.5 (*)    All other components within normal limits  RETICULOCYTES - Abnormal; Notable for the following:    Retic Ct Pct 12.1 (*)    RBC. 2.92 (*)    Retic Count, Manual 353.3 (*)    All other components within normal limits  URINALYSIS, ROUTINE W REFLEX MICROSCOPIC - Abnormal; Notable for the following:    APPearance CLOUDY (*)    All other components within normal limits  CBC WITH DIFFERENTIAL/PLATELET - Abnormal; Notable for the following:    RBC 2.48 (*)    Hemoglobin 7.6 (*)    HCT 21.7 (*)    RDW 17.5 (*)    Platelets 575 (*)    Neutrophils Relative % 40 (*)    Lymphs Abs 4.8 (*)    Monocytes Relative 13 (*)    Monocytes Absolute 1.3 (*)    All other components within normal limits  RETICULOCYTES - Abnormal; Notable for the following:    Retic Ct Pct 12.2 (*)    RBC. 2.51 (*)    Retic Count, Manual 306.2 (*)    All other components within normal limits  CBC - Abnormal; Notable for the following:    WBC 10.6 (*)    RBC 2.51 (*)    Hemoglobin 7.7 (*)    HCT 21.9 (*)    RDW 17.5 (*)    Platelets 596 (*)    All other components within normal limits  LACTATE DEHYDROGENASE  CREATININE, SERUM  POC URINE PREG, ED    Imaging Review No  results found.   EKG Interpretation None      MDM   Final diagnoses:  Sickle cell crisis    24 y.o. female with pertinent PMH of sickle cell ss presents with recurrent sickle cell pain in back [place]  Home vicodin did not relieve pain.  NO concerning historical factors today.  Physical exam benign.    Dilaudid 2mg  x  3 given without relief.  Admitted in stable condition  I have reviewed all laboratory and imaging studies if ordered as above  1. Sickle cell crisis         Debby Freiberg, MD 03/12/15 [phone]

## 2015-03-11 NOTE — ED Notes (Signed)
Pt complains of a sickle cell crisis with the pain in her back that started today

## 2015-03-12 ENCOUNTER — Encounter (HOSPITAL_COMMUNITY): Payer: Self-pay | Admitting: Internal Medicine

## 2015-03-12 DIAGNOSIS — Z7982 Long term (current) use of aspirin: Secondary | ICD-10-CM | POA: Diagnosis not present

## 2015-03-12 DIAGNOSIS — D473 Essential (hemorrhagic) thrombocythemia: Secondary | ICD-10-CM | POA: Diagnosis present

## 2015-03-12 DIAGNOSIS — G43909 Migraine, unspecified, not intractable, without status migrainosus: Secondary | ICD-10-CM | POA: Diagnosis present

## 2015-03-12 DIAGNOSIS — Z6833 Body mass index (BMI) 33.0-33.9, adult: Secondary | ICD-10-CM | POA: Diagnosis not present

## 2015-03-12 DIAGNOSIS — Z9049 Acquired absence of other specified parts of digestive tract: Secondary | ICD-10-CM | POA: Diagnosis present

## 2015-03-12 DIAGNOSIS — D5700 Hb-SS disease with crisis, unspecified: Secondary | ICD-10-CM | POA: Diagnosis present

## 2015-03-12 DIAGNOSIS — E876 Hypokalemia: Secondary | ICD-10-CM | POA: Diagnosis not present

## 2015-03-12 DIAGNOSIS — Z888 Allergy status to other drugs, medicaments and biological substances status: Secondary | ICD-10-CM | POA: Diagnosis not present

## 2015-03-12 DIAGNOSIS — Z713 Dietary counseling and surveillance: Secondary | ICD-10-CM | POA: Diagnosis present

## 2015-03-12 DIAGNOSIS — F329 Major depressive disorder, single episode, unspecified: Secondary | ICD-10-CM | POA: Diagnosis present

## 2015-03-12 DIAGNOSIS — M545 Low back pain: Secondary | ICD-10-CM | POA: Diagnosis present

## 2015-03-12 DIAGNOSIS — D72829 Elevated white blood cell count, unspecified: Secondary | ICD-10-CM | POA: Diagnosis present

## 2015-03-12 DIAGNOSIS — E669 Obesity, unspecified: Secondary | ICD-10-CM | POA: Diagnosis present

## 2015-03-12 DIAGNOSIS — D638 Anemia in other chronic diseases classified elsewhere: Secondary | ICD-10-CM | POA: Diagnosis present

## 2015-03-12 LAB — CBC
HCT: 21.9 % — ABNORMAL LOW (ref 36.0–46.0)
HEMOGLOBIN: 7.7 g/dL — AB (ref 12.0–15.0)
MCH: 30.7 pg (ref 26.0–34.0)
MCHC: 35.2 g/dL (ref 30.0–36.0)
MCV: 87.3 fL (ref 78.0–100.0)
Platelets: 596 10*3/uL — ABNORMAL HIGH (ref 150–400)
RBC: 2.51 MIL/uL — ABNORMAL LOW (ref 3.87–5.11)
RDW: 17.5 % — ABNORMAL HIGH (ref 11.5–15.5)
WBC: 10.6 10*3/uL — ABNORMAL HIGH (ref 4.0–10.5)

## 2015-03-12 LAB — CBC WITH DIFFERENTIAL/PLATELET
BASOS ABS: 0 10*3/uL (ref 0.0–0.1)
Basophils Relative: 0 % (ref 0–1)
Eosinophils Absolute: 0.1 10*3/uL (ref 0.0–0.7)
Eosinophils Relative: 1 % (ref 0–5)
HCT: 21.7 % — ABNORMAL LOW (ref 36.0–46.0)
Hemoglobin: 7.6 g/dL — ABNORMAL LOW (ref 12.0–15.0)
LYMPHS PCT: 46 % (ref 12–46)
Lymphs Abs: 4.8 10*3/uL — ABNORMAL HIGH (ref 0.7–4.0)
MCH: 30.6 pg (ref 26.0–34.0)
MCHC: 35 g/dL (ref 30.0–36.0)
MCV: 87.5 fL (ref 78.0–100.0)
Monocytes Absolute: 1.3 10*3/uL — ABNORMAL HIGH (ref 0.1–1.0)
Monocytes Relative: 13 % — ABNORMAL HIGH (ref 3–12)
Neutro Abs: 4.1 10*3/uL (ref 1.7–7.7)
Neutrophils Relative %: 40 % — ABNORMAL LOW (ref 43–77)
PLATELETS: 575 10*3/uL — AB (ref 150–400)
RBC: 2.48 MIL/uL — ABNORMAL LOW (ref 3.87–5.11)
RDW: 17.5 % — ABNORMAL HIGH (ref 11.5–15.5)
WBC: 10.2 10*3/uL (ref 4.0–10.5)

## 2015-03-12 LAB — LACTATE DEHYDROGENASE: LDH: 197 U/L — ABNORMAL HIGH (ref 98–192)

## 2015-03-12 LAB — CREATININE, SERUM
Creatinine, Ser: 0.43 mg/dL — ABNORMAL LOW (ref 0.44–1.00)
GFR calc non Af Amer: >60 mL/min (ref >60)

## 2015-03-12 LAB — RETICULOCYTES
RBC.: 2.51 MIL/uL — AB (ref 3.87–5.11)
Retic Count, Absolute: 306.2 10*3/uL — ABNORMAL HIGH (ref 19.0–186.0)
Retic Ct Pct: 12.2 % — ABNORMAL HIGH (ref 0.4–3.1)

## 2015-03-12 MED ORDER — POLYETHYLENE GLYCOL 3350 17 G PO PACK: 17.0000 g | PACK | Freq: Every day | ORAL | Status: DC | PRN

## 2015-03-12 MED ORDER — SODIUM CHLORIDE 0.45 % IV SOLN
INTRAVENOUS | Status: AC
  Administered 2015-03-12 (×2): via INTRAVENOUS
  Administered 2015-03-13: 1000 mL via INTRAVENOUS

## 2015-03-12 MED ORDER — HYDROMORPHONE 0.3 MG/ML IV SOLN
INTRAVENOUS | Status: DC
  Administered 2015-03-12: 17:00:00 via INTRAVENOUS
  Administered 2015-03-12: 2.1 mg via INTRAVENOUS
  Administered 2015-03-12: 2.9 mg via INTRAVENOUS
  Administered 2015-03-12: 1.5 mg via INTRAVENOUS
  Administered 2015-03-12: 2.6 mg via INTRAVENOUS
  Administered 2015-03-12 – 2015-03-13 (×2): via INTRAVENOUS
  Administered 2015-03-13 (×2): 2.7 mg via INTRAVENOUS
  Administered 2015-03-13: 03:00:00 via INTRAVENOUS
  Administered 2015-03-13: 5.54 mg via INTRAVENOUS
  Administered 2015-03-13: 2.7 mg via INTRAVENOUS
  Administered 2015-03-13: 2.1 mg via INTRAVENOUS
  Administered 2015-03-14: 3.35 mg via INTRAVENOUS
  Administered 2015-03-14: 3.3 mg via INTRAVENOUS
  Administered 2015-03-14: 1.2 mg via INTRAVENOUS
  Administered 2015-03-14: 1.8 mg via INTRAVENOUS
  Administered 2015-03-14: 01:00:00 via INTRAVENOUS
  Filled 2015-03-12 (×5): qty 25

## 2015-03-12 MED ORDER — KETOROLAC TROMETHAMINE 30 MG/ML IJ SOLN
30.0000 mg | Freq: Four times a day (QID) | INTRAMUSCULAR | Status: DC
  Administered 2015-03-12 – 2015-03-16 (×16): 30 mg via INTRAVENOUS
  Filled 2015-03-12 (×25): qty 1

## 2015-03-12 MED ORDER — ENOXAPARIN SODIUM 40 MG/0.4ML ~~LOC~~ SOLN
40.0000 mg | SUBCUTANEOUS | Status: DC
  Administered 2015-03-12 – 2015-03-18 (×7): 40 mg via SUBCUTANEOUS
  Filled 2015-03-12 (×7): qty 0.4

## 2015-03-12 MED ORDER — IBUPROFEN 200 MG PO TABS: 400.0000 mg | ORAL_TABLET | Freq: Four times a day (QID) | ORAL | Status: DC | PRN

## 2015-03-12 MED ORDER — ONDANSETRON HCL 4 MG/2ML IJ SOLN
4.0000 mg | Freq: Four times a day (QID) | INTRAMUSCULAR | Status: DC | PRN
  Administered 2015-03-12 – 2015-03-14 (×4): 4 mg via INTRAVENOUS
  Filled 2015-03-12 (×4): qty 2

## 2015-03-12 MED ORDER — DIPHENHYDRAMINE HCL 50 MG/ML IJ SOLN: 12.5000 mg | Freq: Four times a day (QID) | INTRAMUSCULAR | Status: DC | PRN

## 2015-03-12 MED ORDER — SODIUM CHLORIDE 0.9 % IV SOLN
25.0000 mg | INTRAVENOUS | Status: DC | PRN
  Administered 2015-03-12 – 2015-03-13 (×4): 25 mg via INTRAVENOUS
  Filled 2015-03-12 (×9): qty 0.5

## 2015-03-12 MED ORDER — LORATADINE 10 MG PO TABS
10.0000 mg | ORAL_TABLET | Freq: Every day | ORAL | Status: DC
Start: 2015-03-12 — End: 2015-03-18
  Administered 2015-03-12 – 2015-03-18 (×7): 10 mg via ORAL
  Filled 2015-03-12 (×7): qty 1

## 2015-03-12 MED ORDER — SENNOSIDES-DOCUSATE SODIUM 8.6-50 MG PO TABS
1.0000 | ORAL_TABLET | Freq: Two times a day (BID) | ORAL | Status: DC
  Administered 2015-03-12 – 2015-03-18 (×12): 1 via ORAL
  Filled 2015-03-12 (×15): qty 1

## 2015-03-12 MED ORDER — DIPHENHYDRAMINE HCL 12.5 MG/5ML PO ELIX: 12.5000 mg | ORAL_SOLUTION | Freq: Four times a day (QID) | ORAL | Status: DC | PRN

## 2015-03-12 MED ORDER — NALOXONE HCL 0.4 MG/ML IJ SOLN: 0.4000 mg | INTRAMUSCULAR | Status: DC | PRN

## 2015-03-12 MED ORDER — ENSURE ENLIVE PO LIQD
237.0000 mL | Freq: Two times a day (BID) | ORAL | Status: DC
  Administered 2015-03-12 – 2015-03-18 (×11): 237 mL via ORAL

## 2015-03-12 MED ORDER — SODIUM CHLORIDE 0.9 % IJ SOLN: 9.0000 mL | INTRAMUSCULAR | Status: DC | PRN

## 2015-03-12 MED ORDER — FOLIC ACID 1 MG PO TABS
1.0000 mg | ORAL_TABLET | Freq: Every day | ORAL | Status: DC
  Administered 2015-03-12 – 2015-03-18 (×7): 1 mg via ORAL
  Filled 2015-03-12 (×7): qty 1

## 2015-03-12 MED ORDER — DIPHENHYDRAMINE HCL 25 MG PO CAPS
25.0000 mg | ORAL_CAPSULE | Freq: Four times a day (QID) | ORAL | Status: DC | PRN
Start: 2015-03-12 — End: 2015-03-18
  Administered 2015-03-14 – 2015-03-15 (×2): 50 mg via ORAL
  Filled 2015-03-12: qty 1
  Filled 2015-03-12: qty 2
  Filled 2015-03-12: qty 1

## 2015-03-12 NOTE — Progress Notes (Signed)
Initial Nutrition Assessment  DOCUMENTATION CODES:  Obesity unspecified  INTERVENTION:  Ensure Enlive (each supplement provides 350kcal and 20 grams of protein) (BID)  NUTRITION DIAGNOSIS:  Unintentional weight loss related to other (see comment) (sickle cell pain) as evidenced by percent weight loss.  GOAL:  Patient will meet greater than or equal to 90% of their needs   MONITOR:  PO intake, Supplement acceptance, Labs, Weight trends, Skin, I & O's  REASON FOR ASSESSMENT:  Malnutrition Screening Tool    ASSESSMENT: 24 y.o. female with history of sickle cell anemia who was recently admitted for healthcare associated pneumonia presents to the ER because of low back pain.   Pt states her appetite and PO intake has improved. Pt reports poor appetite PTA d/t pain from sickle cell crisis. Pt has had 11 lb weight loss since 5/2 (6% weight loss x 9 days, significant for time frame).  Pt would like Ensure supplements. RD to order.  Nutrition focused physical exam shows no sign of depletion of muscle mass or body fat.  Labs reviewed; Low creatinine  Height:  Ht Readings from Last 1 Encounters:  03/12/15 5\' 3"  (1.6 m)    Weight:  Wt Readings from Last 1 Encounters:  03/12/15 186 lb 6.4 oz (84.55 kg)    Ideal Body Weight:  52.3 kg  Wt Readings from Last 10 Encounters:  03/12/15 186 lb 6.4 oz (84.55 kg)  02/21/15 184 lb 4.8 oz (83.598 kg)  05/16/14 198 lb 6.6 oz (90 kg)  03/28/14 186 lb 12.8 oz (84.732 kg)  01/29/14 135 lb (61.236 kg)  11/12/13 183 lb 9.6 oz (83.28 kg)  11/10/13 170 lb (77.111 kg)  11/02/13 160 lb (72.576 kg)  09/03/13 170 lb (77.111 kg)  06/12/13 195 lb (88.451 kg)    BMI:  Body mass index is 33.03 kg/(m^2).  Estimated Nutritional Needs:  Kcal:  1400-1600  Protein:  65-75g  Fluid:  1.5L/day    Skin:  Reviewed, no issues  Diet Order:  Diet regular Room service appropriate?: Yes; Fluid consistency:: Thin  EDUCATION NEEDS:  No  education needs identified at this time  No intake or output data in the 24 hours ending 03/12/15 1248  Last BM:  5/10  Clayton Bibles, MS, RD, LDN Pager: [phone] After Hours Pager: [phone]

## 2015-03-12 NOTE — ED Notes (Signed)
Pt crying and arguing with someone on phone.

## 2015-03-12 NOTE — ED Notes (Signed)
Pt sitting with her cell phone up to her ear, crying. She states her pain has not changed. Provider aware.

## 2015-03-12 NOTE — Progress Notes (Signed)
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 Pt states that she was doing well and started a new job yesterday when she felt pain coming on. However she had no ibuprofen and did not take the Norco because it makes her sleepy which she could not afford with her new job. The pain escalated to the point where it was unresponsive to the Not[place] and she presented to the ED. She has used 6.5 mg with 22/20: demands/deliveries in the last 24 hours.   Pt still rating her pain as 8/10 and localized to her legs. Will re-assess in 4 hours and if still not using much on the PCA will schedule oral analgesics and continue PCA as PRN. in anticipation of possible discharge tomorrow. 2. Anemia of chronic disease: Per patients report Hb stable. 3. Thrombocytosis: I suspect that this is related to her Hb SS. I anticipate that she will have a decrease in platelet count once she is on Hydrea. However in the mean time I will start on a daily ASA 81 mg and she will need  re-assessment in the out patient setting. 4. Psychosocial: Pt is stressed out as she has started a new job yesterday. She reports that she has another interview in 3 days for an additional job which she also intends to do while trying to go to school. I anticipate that given her pattern of hospitalization and  . However she is clear that she does not want any assistance from [facility]) in resolving any of her social issues.  Code Status: Full Code Family Communication: N/A Disposition Plan: Anticipate discharge tomorrow.  Curvin Hunger A.  Pager [phone]. If 7PM-7AM, please contact night-coverage.  03/12/2015,  12:01 PM  LOS: 0 days    Consultants:  None  Procedures:  None  Antibiotics:  Cefepime 4/26 >>4/27  Vancomycin 4/26>>4/27  Levaquin 4/27 >> 4/28  Levaquin 4/28 >>  HPI/Subjective: Pt reports that she continued to have pain in her legs at a level of 8/10. She reports her last BM as yesterday. She has no other complaints today. Discussed possible discharge tomorrow.  Objective: Filed Vitals:   03/12/15 0412 03/12/15 0745 03/12/15 1045 03/12/15 1105  BP:   113/62   Pulse:   68   Temp:   97.6 F (36.4 C)   TempSrc:   Oral   Resp: 16 16 18 11   Height:      Weight:      SpO2: 99% 99% 100% 100%   Weight change:  No intake or output data in the 24 hours ending 03/12/15 1201  General: Alert, awake, oriented x3, in no acute distress. Well appearing HEENT: [place]/AT PEERL, EOMI, anicteric OROPHARYNX:  Moist, No exudate/ erythema/lesions.  Heart: Regular rate and rhythm, without murmurs, rubs, gallops, PMI non-displaced, no heaves or thrills on palpation.  Lungs: Clear to auscultation, no wheezing or rhonchi noted. No increased vocal fremitus resonant to percussion  Abdomen: Soft, nontender, nondistended, positive bowel sounds, no masses no hepatosplenomegaly noted.  Neuro: No focal neurological deficits noted cranial nerves II through XII grossly intact.  Strength functional in bilateral upper and lower extremities. Musculoskeletal: No warm swelling  or erythema around joints, no spinal tenderness noted. Psychiatric: Patient alert and oriented x3, good insight and cognition, good recent to remote recall.  Data Reviewed: Basic Metabolic Panel:  Recent Labs Lab 03/11/15 2253 03/12/15 0500  NA 141  --   K 3.2*  --   CL 108  --   CO2 22  --   GLUCOSE 90  --   BUN 6  --   CREATININE 0.60 0.43*  CALCIUM 9.7  --    Liver Function Tests:  Recent Labs Lab 03/11/15 2253  AST 26  ALT 21  ALKPHOS 49  BILITOT 1.5*  PROT 8.5*  ALBUMIN 4.5   No results for input(s):  LIPASE, AMYLASE in the last 168 hours. No results for input(s): AMMONIA in the last 168 hours. CBC:  Recent Labs Lab 03/11/15 2253 03/12/15 0500  WBC 8.9 10.2  10.6*  NEUTROABS 3.9 4.1  HGB 9.0* 7.6*  7.7*  HCT 25.6* 21.7*  21.9*  MCV 87.7 87.5  87.3  PLT 730* 575*  596*    Studies: Dg Chest 2 View  02/28/2015   CLINICAL DATA:  Chest pain; sickle cell crisis  EXAM: CHEST  2 VIEW  COMPARISON:  Chest radiograph and chest CT February 25, 2015  FINDINGS: Subtle patchy opacity is noted in both upper lobes, suspicious for patchy pneumonia. Lungs elsewhere appear clear. The heart size and pulmonary vascularity are normal. No adenopathy. Several endplate defects are noted in the thoracic spine, upright representing infarcts from known sickle cell disease.  IMPRESSION: Subtle patchy opacity in each upper lobe. Lungs elsewhere clear. No evidence of volume overload.   Electronically Signed   By: Lowella Grip III M.D.   On: 02/28/2015 19:53   Dg Chest 2 View  02/25/2015   CLINICAL DATA:  Sickle cell pain crisis.  Mid chest pain.  EXAM: CHEST  2 VIEW  COMPARISON:  11/28/2014  FINDINGS: Prominent markings in the right lung base are stable since prior study, likely scarring. Left lung clear. Heart is normal size. No effusions or acute airspace opacities. No acute bony abnormality.  IMPRESSION: No active cardiopulmonary disease.   Electronically Signed   By: Rolm Baptise M.D.   On: 02/25/2015 12:04     Scheduled Meds: . enoxaparin (LOVENOX) injection  40 mg Subcutaneous Q24H  . folic acid  1 mg Oral Daily  . HYDROmorphone PCA 0.3 mg/mL   Intravenous 6 times per day  . ketorolac  30 mg Intravenous 4 times per day  . loratadine  10 mg Oral Daily  . senna-docusate  1 tablet Oral BID   Continuous Infusions: . sodium chloride 100 mL/hr at 03/12/15 0411    Time spent 35 minutes. Greater than 50% of the time spent in discussing her acute condition.

## 2015-03-12 NOTE — ED Notes (Signed)
Pt talking on the cell phone and appears in no acute distress.

## 2015-03-12 NOTE — H&P (Signed)
[facility] History and Physical  Carmen [name] FFM:[id number] DOB: [birth year] DOA: 03/11/2015  Referring physician: Dr. Colin Rhein. PCP: [name], MD  Specialists: None.  Chief Complaint: Low back pain.  HPI: Carmen [name] is a 24 y.o. female with history of sickle cell anemia who was recently admitted for healthcare associated pneumonia presents to the ER because of low back pain. Patient states she started developing low back pain since morning. Denies any dysuria discharges fever chills nausea vomiting or diarrhea. Denies any chest pain or shortness of breath or any focal deficits. Despite giving multiple doses of pain related medications patient still has pain. Patient has been admitted for further pain management secondary to sickle cell pain crisis.   Review of Systems: As presented in the history of presenting illness, rest negative.  Past Medical History  Diagnosis Date  . Sickle cell crisis   . Depression   . Headache(784.0)   . Abortion     05/2012   Past Surgical History  Procedure Laterality Date  . Splenectomy    . Cholecystectomy N/A 11/30/2014    Procedure: LAPAROSCOPIC CHOLECYSTECTOMY SINGLE SITE WITH INTRAOPERATIVE CHOLANGIOGRAM;  Surgeon: Michael Boston, MD;  Location: [facility];  Service: General;  Laterality: N/A;   Social History:  reports that she has never smoked. She has never used smokeless tobacco. She reports that she does not drink alcohol or use illicit drugs. Where does patient live home. Can patient participate in ADLs? Yes.  Allergies  Allergen Reactions  . Other Nausea Only    Patient stated that all iv pain medication make her have really bad nausea    Family History:  Family History  Problem Relation Age of Onset  . Hypertension Mother   . Sickle cell anemia Sister   . Kidney disease Sister   . Arthritis Sister   . Sickle cell anemia Sister   . Sickle cell trait Sister   . Heart disease Maternal Aunt   . Heart disease Maternal  Uncle       Prior to Admission medications   Medication Sig Start Date End Date Taking? Authorizing Provider  cetirizine (ZYRTEC) 10 MG tablet Take 10 mg by mouth daily.    Yes Historical Provider, MD  HYDROcodone-acetaminophen (NORCO) 10-325 MG per tablet Take 1-2 tablets by mouth every 4 (four) hours as needed (for pain).   Yes Historical Provider, MD  ibuprofen (ADVIL,MOTRIN) 200 MG tablet Take 400-600 mg by mouth every 6 (six) hours as needed for moderate pain.   Yes Historical Provider, MD  aspirin EC 81 MG EC tablet Take 1 tablet (81 mg total) by mouth daily. Patient not taking: Reported on 02/28/2015 02/19/15   Leana Gamer, MD  folic acid (FOLVITE) 1 MG tablet Take 1 tablet (1 mg total) by mouth daily. Patient not taking: Reported on 02/28/2015 02/19/15   Leana Gamer, MD  hydroxyurea (HYDREA) 500 MG capsule Take 1 capsule (500 mg total) by mouth daily. May take with food to minimize GI side effects. Patient not taking: Reported on 02/28/2015 02/22/15   Doy Hutching, MD  levofloxacin (LEVAQUIN) 750 MG tablet Take 1 tablet (750 mg total) by mouth daily. Patient not taking: Reported on 02/28/2015 02/27/15   Elwyn Reach, MD    Physical Exam: Filed Vitals:   03/12/15 0030 03/12/15 0155 03/12/15 0213 03/12/15 0327  BP: 124/74  117/77 105/68  Pulse: 98  99 98  Temp:   98.7 F (37.1 C) 98 F (36.7 C)  TempSrc:   Oral Oral  Resp:   18 18  Height:    5\' 3"  (1.6 m)  Weight:  84.46 kg (186 lb 3.2 oz)  84.55 kg (186 lb 6.4 oz)  SpO2: 98%  99% 99%     General:  Obese not in distress.  Eyes: Anicteric mild pallor.  ENT: No discharge from the ears eyes nose or mouth.  Neck: No mass felt.  Cardiovascular: S1 and S2 heard.  Respiratory: No rhonchi or crepitations.  Abdomen: Soft nontender bowel sounds present.  Skin: No rash.  Musculoskeletal: No edema.  Psychiatric: Appears normal.  Neurologic: Alert awake oriented to time place and person. Moves all  extremities.  Labs on Admission:  Basic Metabolic Panel:  Recent Labs Lab 03/11/15 2253  NA 141  K 3.2*  CL 108  CO2 22  GLUCOSE 90  BUN 6  CREATININE 0.60  CALCIUM 9.7   Liver Function Tests:  Recent Labs Lab 03/11/15 2253  AST 26  ALT 21  ALKPHOS 49  BILITOT 1.5*  PROT 8.5*  ALBUMIN 4.5   No results for input(s): LIPASE, AMYLASE in the last 168 hours. No results for input(s): AMMONIA in the last 168 hours. CBC:  Recent Labs Lab 03/11/15 2253  WBC 8.9  NEUTROABS 3.9  HGB 9.0*  HCT 25.6*  MCV 87.7  PLT 730*   Cardiac Enzymes: No results for input(s): CKTOTAL, CKMB, CKMBINDEX, TROPONINI in the last 168 hours.  BNP (last 3 results) No results for input(s): BNP in the last 8760 hours.  ProBNP (last 3 results) No results for input(s): PROBNP in the last 8760 hours.  CBG: No results for input(s): GLUCAP in the last 168 hours.  Radiological Exams on Admission: No results found.   Assessment/Plan Principal Problem:   Sickle cell crisis Active Problems:   Sickle cell pain crisis   Anemia   1. Sickle cell pain crisis - patient states he takes as needed hydrocodone but patient is not on any long-acting medications. At this time I'll place patient on Dilaudid PCA. Patient at discharge was instructed take hydroxyurea last week but patient states he does not take hydroxyurea. Continue with gentle hydration. 2. Sickle cell anemia - follow CBC. At this time hemoglobin appears to be at baseline. 3. Thrombocytosis - follow CBC closely.   DVT Prophylaxis Lovenox.  Code Status: Full code.  Family Communication: Discussed with patient.  Disposition Plan: Admit to inpatient.    Grantham Hippert N. [facility] Pager [phone].  If 7PM-7AM, please contact night-coverage [email or web address] Password [facility] 03/12/2015, 3:39 AM

## 2015-03-12 NOTE — ED Notes (Signed)
MD Lara Mulch at bedside.

## 2015-03-13 DIAGNOSIS — D5700 Hb-SS disease with crisis, unspecified: Principal | ICD-10-CM

## 2015-03-13 MED ORDER — HYDROCODONE-ACETAMINOPHEN 5-325 MG PO TABS
2.0000 | ORAL_TABLET | ORAL | Status: DC
  Administered 2015-03-13 – 2015-03-14 (×7): 2 via ORAL
  Filled 2015-03-13 (×7): qty 2

## 2015-03-13 MED ORDER — SODIUM CHLORIDE 0.45 % IV SOLN
INTRAVENOUS | Status: DC
  Administered 2015-03-13 (×2): 1000 mL via INTRAVENOUS
  Administered 2015-03-14 – 2015-03-15 (×3): via INTRAVENOUS

## 2015-03-13 MED ORDER — HYDROCODONE-ACETAMINOPHEN 5-325 MG PO TABS
2.0000 | ORAL_TABLET | Freq: Once | ORAL | Status: AC
  Administered 2015-03-13: 2 via ORAL
  Filled 2015-03-13: qty 2

## 2015-03-13 NOTE — Progress Notes (Signed)
Subjective: A 24 yo admitted with sickle cell painful crisis. She has been on Dilaudid PCA and has used 13 mg with 38 demands and 36 deliveries. Has also been on Ibuprofen. Has been having pain at 9/10. Has pain in her legs and back. Has been trying to get her pain under control for her to get to her work by Monday.  Objective: Vital signs in last 24 hours: Temp:  [97.6 F (36.4 C)-98.5 F (36.9 C)] 98.5 F (36.9 C) (05/12 0543) Pulse Rate:  [66-87] 79 (05/12 0550) Resp:  [11-21] 14 (05/12 0550) BP: (88-113)/(59-79) 88/71 mmHg (05/12 0543) SpO2:  [86 %-100 %] 100 % (05/12 0550) FiO2 (%):  [21 %-28 %] 28 % (05/12 0408) Weight change:  Last BM Date: 03/12/15  Intake/Output from previous day: 05/11 0701 - 05/12 0700 In: 3419.4 [P.O.:680; I.V.:2589.4; IV Piggyback:150] Out: 2525 [Urine:2525] Intake/Output this shift: Total I/O In: 3419.4 [P.O.:680; I.V.:2589.4; IV Piggyback:150] Out: 2525 [Urine:2525]  General appearance: alert, cooperative and no distress Head: Normocephalic, without obvious abnormality, atraumatic Neck: no adenopathy, no carotid bruit, no JVD, supple, symmetrical, trachea midline and thyroid not enlarged, symmetric, no tenderness/mass/nodules Back: symmetric, no curvature. ROM normal. No CVA tenderness. Resp: clear to auscultation bilaterally Chest wall: no tenderness Cardio: regular rate and rhythm, S1, S2 normal, no murmur, click, rub or gallop GI: soft, non-tender; bowel sounds normal; no masses,  no organomegaly Extremities: extremities normal, atraumatic, no cyanosis or edema Pulses: 2+ and symmetric Skin: Skin color, texture, turgor normal. No rashes or lesions Neurologic: Grossly normal  Lab Results:  Recent Labs  03/11/15 2253 03/12/15 0500  WBC 8.9 10.2  10.6*  HGB 9.0* 7.6*  7.7*  HCT 25.6* 21.7*  21.9*  PLT 730* 575*  596*   BMET  Recent Labs  03/11/15 2253 03/12/15 0500  NA 141  --   K 3.2*  --   CL 108  --   CO2 22  --    GLUCOSE 90  --   BUN 6  --   CREATININE 0.60 0.43*  CALCIUM 9.7  --     Studies/Results: No results found.  Medications: I have reviewed the patient's current medications.  Assessment/Plan: A 24 yo admitted with sickle cell painful crisis.   #1. Sickle Cell Painful crisis: Patient is using more of the PCA today. I will schedule her Norco today and leave the PCa for breakthrough pain. Will continue the Ibuprofen.  #2. Sickle Cell Anemia: H/H stabilized.   #3. Hypokalemia: repleted. Re-check in am  #4. Thrombocytosis: Monitor closely.   LOS: 1 day   Nethan Caudillo,LAWAL 03/13/2015, 6:41 AM

## 2015-03-13 NOTE — Progress Notes (Signed)
Awaken c/o pain 9/10 and that pain increased while sleeping and not hitting PCA button. Gave vicodin x1 dose as previously ordered by NP.

## 2015-03-13 NOTE — Progress Notes (Signed)
Dr. Jonelle Sidle verbally ordered Norco5/325 2 tablets every 4 hours.

## 2015-03-13 NOTE — Plan of Care (Addendum)
Problem: Phase I Progression Outcomes Goal: Pain controlled with appropriate interventions Outcome: Progressing Patient reached max on 1 hour limit and was unable to use PCA for about 24 minutes. Spoke to [employer] NP about it. Orders received. Patient asleep by time orders received for vicodin so not given.

## 2015-03-13 NOTE — Progress Notes (Signed)
Patient sats drop while she is sleeping and snoring to upper 80's on room air. Applied oxygen at 2l [place].

## 2015-03-13 NOTE — Plan of Care (Signed)
Problem: Phase III Progression Outcomes Goal: Voiding independently Outcome: Completed/Met Date Met:  03/13/15 Patient on menstrual cycle

## 2015-03-14 LAB — CBC WITH DIFFERENTIAL/PLATELET
BASOS PCT: 0 % (ref 0–1)
Basophils Absolute: 0 10*3/uL (ref 0.0–0.1)
Eosinophils Absolute: 0.5 10*3/uL (ref 0.0–0.7)
Eosinophils Relative: 4 % (ref 0–5)
HCT: 24.6 % — ABNORMAL LOW (ref 36.0–46.0)
HEMOGLOBIN: 8.4 g/dL — AB (ref 12.0–15.0)
LYMPHS ABS: 5.8 10*3/uL — AB (ref 0.7–4.0)
LYMPHS PCT: 56 % — AB (ref 12–46)
MCH: 30.1 pg (ref 26.0–34.0)
MCHC: 34.1 g/dL (ref 30.0–36.0)
MCV: 88.2 fL (ref 78.0–100.0)
Monocytes Absolute: 1.2 10*3/uL — ABNORMAL HIGH (ref 0.1–1.0)
Monocytes Relative: 11 % (ref 3–12)
NEUTROS ABS: 2.9 10*3/uL (ref 1.7–7.7)
Neutrophils Relative %: 28 % — ABNORMAL LOW (ref 43–77)
PLATELETS: 527 10*3/uL — AB (ref 150–400)
RBC: 2.79 MIL/uL — ABNORMAL LOW (ref 3.87–5.11)
RDW: 18.1 % — AB (ref 11.5–15.5)
WBC: 10.4 10*3/uL (ref 4.0–10.5)

## 2015-03-14 LAB — COMPREHENSIVE METABOLIC PANEL
ALT: 18 U/L (ref 14–54)
AST: 35 U/L (ref 15–41)
Albumin: 4.2 g/dL (ref 3.5–5.0)
Alkaline Phosphatase: 48 U/L (ref 38–126)
Anion gap: 11 (ref 5–15)
BUN: <5 mg/dL — ABNORMAL LOW (ref 6–20)
CO2: 24 mmol/L (ref 22–32)
Calcium: 9.2 mg/dL (ref 8.9–10.3)
Chloride: 104 mmol/L (ref 101–111)
Creatinine, Ser: 0.51 mg/dL (ref 0.44–1.00)
GFR calc Af Amer: >60 mL/min (ref >60)
GFR calc non Af Amer: >60 mL/min (ref >60)
GLUCOSE: 95 mg/dL (ref 65–99)
Potassium: 3.9 mmol/L (ref 3.5–5.1)
SODIUM: 139 mmol/L (ref 135–145)
TOTAL PROTEIN: 7.6 g/dL (ref 6.5–8.1)
Total Bilirubin: 0.8 mg/dL (ref 0.3–1.2)

## 2015-03-14 MED ORDER — OXYCODONE HCL 5 MG PO TABS
5.0000 mg | ORAL_TABLET | ORAL | Status: DC
  Administered 2015-03-14 – 2015-03-16 (×9): 5 mg via ORAL
  Filled 2015-03-14 (×9): qty 1

## 2015-03-14 MED ORDER — HYDROMORPHONE HCL 1 MG/ML IJ SOLN
1.0000 mg | INTRAMUSCULAR | Status: DC | PRN
  Administered 2015-03-14 (×3): 1 mg via INTRAVENOUS
  Filled 2015-03-14 (×3): qty 1

## 2015-03-14 MED ORDER — HYDROMORPHONE HCL 1 MG/ML IJ SOLN
1.0000 mg | INTRAMUSCULAR | Status: DC | PRN
  Administered 2015-03-14 – 2015-03-15 (×7): 1 mg via INTRAVENOUS
  Filled 2015-03-14 (×11): qty 1

## 2015-03-14 NOTE — Plan of Care (Signed)
Problem: Phase I Progression Outcomes Goal: Pain controlled with appropriate interventions Outcome: Progressing Still rating pain 9/10 in lower back.

## 2015-03-14 NOTE — Progress Notes (Signed)
Subjective: Patient still complaining of 8 out of 10 pain. She is not using much of her Dilaudid PCA. She used 60 mg in the last 24 hours. Pain is localized to her legs and back. She is on the Vicodin but is not getting adequate relief. In the last couple of weeks patient has been on high-dose narcotics. She was previously opiates Cecilie Lowers but has gradually become used to the opiates that I'm afraid she is becoming tolerant. She denies shortness of breath or cough no nausea vomiting or diarrhea. Objective: Vital signs in last 24 hours: Temp:  [98 F (36.7 C)-98.8 F (37.1 C)] 98.6 F (37 C) (05/13 1033) Pulse Rate:  [60-80] 70 (05/13 1033) Resp:  [10-19] 16 (05/13 1123) BP: (115-123)/(72-85) 115/82 mmHg (05/13 1033) SpO2:  [97 %-100 %] 100 % (05/13 1123) FiO2 (%):  [28 %] 28 % (05/13 0417) Weight change:  Last BM Date: 03/12/15 (pt stated)  Intake/Output from previous day: 05/12 0701 - 05/13 0700 In: 4017.5 [P.O.:1560; I.V.:2407.5; IV Piggyback:50] Out: 5400 [Urine:4250] Intake/Output this shift: Total I/O In: 240 [P.O.:240] Out: 1900 [Urine:1900]  General appearance: alert, cooperative and no distress Head: Normocephalic, without obvious abnormality, atraumatic Neck: no adenopathy, no carotid bruit, no JVD, supple, symmetrical, trachea midline and thyroid not enlarged, symmetric, no tenderness/mass/nodules Back: symmetric, no curvature. ROM normal. No CVA tenderness. Resp: clear to auscultation bilaterally Chest wall: no tenderness Cardio: regular rate and rhythm, S1, S2 normal, no murmur, click, rub or gallop GI: soft, non-tender; bowel sounds normal; no masses,  no organomegaly Extremities: extremities normal, atraumatic, no cyanosis or edema Pulses: 2+ and symmetric Skin: Skin color, texture, turgor normal. No rashes or lesions Neurologic: Grossly normal  Lab Results:  Recent Labs  03/12/15 0500 03/14/15 0517  WBC 10.2  10.6* 10.4  HGB 7.6*  7.7* 8.4*  HCT 21.7*   21.9* 24.6*  PLT 575*  596* 527*   BMET  Recent Labs  03/11/15 2253 03/12/15 0500 03/14/15 0517  NA 141  --  139  K 3.2*  --  3.9  CL 108  --  104  CO2 22  --  24  GLUCOSE 90  --  95  BUN 6  --  <5*  CREATININE 0.60 0.43* 0.51  CALCIUM 9.7  --  9.2    Studies/Results: No results found.  Medications: I have reviewed the patient's current medications.  Assessment/Plan: A 24 yo admitted with sickle cell painful crisis.   #1. Sickle Cell Painful crisis: Patient is not getting adequate control with the Vicodin. I will switch her to oxycodone 5 mg every 6 hours scheduled. I will discontinue the PCA is still use physician assisted dosing of 1 mg Dilaudid every 3 hours in preparation for discharge tomorrow. I will more than likely discharge her on oxycodone since she is more tolerant to narcotics now. Will continue the Ibuprofen.  #2. Sickle Cell Anemia: H/H stabilized.   #3. Hypokalemia: repleted. Re-check in am  #4. Thrombocytosis: Monitor closely.   LOS: 2 days   GARBA,LAWAL 03/14/2015, 1:36 PM

## 2015-03-15 LAB — COMPREHENSIVE METABOLIC PANEL
ALT: 23 U/L (ref 14–54)
ANION GAP: 8 (ref 5–15)
AST: 27 U/L (ref 15–41)
Albumin: 3.9 g/dL (ref 3.5–5.0)
Alkaline Phosphatase: 49 U/L (ref 38–126)
BUN: 5 mg/dL — ABNORMAL LOW (ref 6–20)
CO2: 27 mmol/L (ref 22–32)
Calcium: 9 mg/dL (ref 8.9–10.3)
Chloride: 104 mmol/L (ref 101–111)
Creatinine, Ser: 0.4 mg/dL — ABNORMAL LOW (ref 0.44–1.00)
GFR calc non Af Amer: >60 mL/min (ref >60)
GLUCOSE: 97 mg/dL (ref 65–99)
POTASSIUM: 3.5 mmol/L (ref 3.5–5.1)
SODIUM: 139 mmol/L (ref 135–145)
Total Bilirubin: 1.6 mg/dL — ABNORMAL HIGH (ref 0.3–1.2)
Total Protein: 7 g/dL (ref 6.5–8.1)

## 2015-03-15 LAB — CBC WITH DIFFERENTIAL/PLATELET
Basophils Absolute: 0 10*3/uL (ref 0.0–0.1)
Basophils Relative: 0 % (ref 0–1)
EOS PCT: 3 % (ref 0–5)
Eosinophils Absolute: 0.3 10*3/uL (ref 0.0–0.7)
HCT: 25.1 % — ABNORMAL LOW (ref 36.0–46.0)
Hemoglobin: 8.7 g/dL — ABNORMAL LOW (ref 12.0–15.0)
LYMPHS ABS: 4.5 10*3/uL — AB (ref 0.7–4.0)
Lymphocytes Relative: 50 % — ABNORMAL HIGH (ref 12–46)
MCH: 30.4 pg (ref 26.0–34.0)
MCHC: 34.7 g/dL (ref 30.0–36.0)
MCV: 87.8 fL (ref 78.0–100.0)
MONO ABS: 1 10*3/uL (ref 0.1–1.0)
Monocytes Relative: 11 % (ref 3–12)
Neutro Abs: 3.2 10*3/uL (ref 1.7–7.7)
Neutrophils Relative %: 36 % — ABNORMAL LOW (ref 43–77)
Platelets: 597 10*3/uL — ABNORMAL HIGH (ref 150–400)
RBC: 2.86 MIL/uL — ABNORMAL LOW (ref 3.87–5.11)
RDW: 18.1 % — ABNORMAL HIGH (ref 11.5–15.5)
WBC: 9 10*3/uL (ref 4.0–10.5)

## 2015-03-15 MED ORDER — HYDROMORPHONE HCL 1 MG/ML IJ SOLN
1.0000 mg | Freq: Once | INTRAMUSCULAR | Status: AC
  Administered 2015-03-15: 1 mg via INTRAVENOUS

## 2015-03-15 MED ORDER — HYDROMORPHONE HCL 2 MG/ML IJ SOLN
2.0000 mg | INTRAMUSCULAR | Status: DC | PRN
  Administered 2015-03-15 – 2015-03-16 (×3): 2 mg via INTRAVENOUS
  Filled 2015-03-15 (×2): qty 1

## 2015-03-15 NOTE — Progress Notes (Signed)
Subjective: Patient still complaining of pain which has worsened today. Per patient the pain got was last night and her Dilaudid when necessary was changed to every 2 hours. She is not having problem in her knees with the pain as high as 10 out of 10. She is still getting the oxycodone but the physician assisted dosing is not helping her much. She's been able to walk around. Denied fever or chills no shortness of breath no cough. She is still trying to get out of the hospital so she called return to work on Monday.   Objective: Vital signs in last 24 hours: Temp:  [98 F (36.7 C)-98.7 F (37.1 C)] 98 F (36.7 C) (05/14 1740) Pulse Rate:  [75-85] 77 (05/14 1740) Resp:  [15-18] 17 (05/14 1740) BP: (113-136)/(66-79) 136/79 mmHg (05/14 1740) SpO2:  [96 %-100 %] 96 % (05/14 1740) Weight change:  Last BM Date: 03/14/15  Intake/Output from previous day: 05/13 0701 - 05/14 0700 In: 1280 [P.O.:480; I.V.:800] Out: 2700 [Urine:2700] Intake/Output this shift:    General appearance: alert, cooperative and no distress Head: Normocephalic, without obvious abnormality, atraumatic Neck: no adenopathy, no carotid bruit, no JVD, supple, symmetrical, trachea midline and thyroid not enlarged, symmetric, no tenderness/mass/nodules Back: symmetric, no curvature. ROM normal. No CVA tenderness. Resp: clear to auscultation bilaterally Chest wall: no tenderness Cardio: regular rate and rhythm, S1, S2 normal, no murmur, click, rub or gallop GI: soft, non-tender; bowel sounds normal; no masses,  no organomegaly Extremities: extremities normal, atraumatic, no cyanosis or edema Pulses: 2+ and symmetric Skin: Skin color, texture, turgor normal. No rashes or lesions Neurologic: Grossly normal  Lab Results:  Recent Labs  03/14/15 0517 03/15/15 0445  WBC 10.4 9.0  HGB 8.4* 8.7*  HCT 24.6* 25.1*  PLT 527* 597*   BMET  Recent Labs  03/14/15 0517 03/15/15 0445  NA 139 139  K 3.9 3.5  CL 104 104   CO2 24 27  GLUCOSE 95 97  BUN <5* 5*  CREATININE 0.51 0.40*  CALCIUM 9.2 9.0    Studies/Results: No results found.  Medications: I have reviewed the patient's current medications.  Assessment/Plan: A 24 yo admitted with sickle cell painful crisis.   #1. Sickle Cell Painful crisis: Patient is getting better but not completely out of her pain. I will increase the physician assisted dosing to 2 mg every 2 hours as needed overnight. Continue with the oxycodone and if she does well I will discharge her tomorrow on oral oxycodone to follow with her primary care physician.  #2. Sickle Cell Anemia: H/H stabilized. No evidence of hemolysis  #3. Hypokalemia: repleted. Continue to monitor  #4. 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 Monitor closely.   LOS: 3 days   Adaliah Hiegel,LAWAL 03/15/2015, 8:01 PM

## 2015-03-16 MED ORDER — IBUPROFEN 800 MG PO TABS
800.0000 mg | ORAL_TABLET | Freq: Four times a day (QID) | ORAL | Status: DC
  Administered 2015-03-16 – 2015-03-18 (×9): 800 mg via ORAL
  Filled 2015-03-16 (×13): qty 1

## 2015-03-16 MED ORDER — HYDROMORPHONE HCL 1 MG/ML IJ SOLN: 1.0000 mg | INTRAMUSCULAR | Status: DC | PRN

## 2015-03-16 MED ORDER — IBUPROFEN 800 MG PO TABS: 800.0000 mg | ORAL_TABLET | Freq: Four times a day (QID) | ORAL | Status: DC

## 2015-03-16 MED ORDER — HYDROMORPHONE HCL 1 MG/ML IJ SOLN
1.0000 mg | INTRAMUSCULAR | Status: AC | PRN
Start: 2015-03-16 — End: 2015-03-17
  Administered 2015-03-16 (×2): 1 mg via INTRAVENOUS
  Administered 2015-03-17 (×2): 2 mg via INTRAVENOUS
  Filled 2015-03-16 (×3): qty 2

## 2015-03-16 MED ORDER — OXYCODONE HCL 5 MG PO TABS
10.0000 mg | ORAL_TABLET | ORAL | Status: DC
  Administered 2015-03-16 – 2015-03-17 (×7): 10 mg via ORAL
  Filled 2015-03-16 (×7): qty 2

## 2015-03-16 MED ORDER — OXYCODONE HCL 10 MG PO TABS: 5.0000 mg | ORAL_TABLET | ORAL | Status: DC

## 2015-03-16 NOTE — Progress Notes (Signed)
This patient asked to speak to me about her issues with pain management. She voiced concerns about her pain medication being changed and now being in pain. She voiced that her pain is as bad as when she came into the hospital. Patient is tearful. I spoke to Donald Siva, NP about patients concerns regarding her pain at this time.

## 2015-03-16 NOTE — Discharge Summary (Signed)
Physician Discharge Summary  Patient ID: Carmen [name] MRN: [id number] DOB/AGE: 1991-01-06 24 y.o.  Admit date: 03/11/2015 Discharge date: 03/16/2015  Admission Diagnoses:  Discharge Diagnoses:  Principal Problem:   Sickle cell crisis Active Problems:   Sickle cell pain crisis   Anemia   Discharged Condition: good  Hospital Course: Patient admitted with sickle cell painful crisis. She is opiate nave and was on low dose Dilaudid PCA. She responded to treatment and was taken off and started on oral medications. She has been titrated off all IV medications but was very difficult to manage. She kept insisting on wanting IV medicines. It appears she wanted to be in the hospital longer for social reasons. At the time of discharge she was transitioning back to hydrocodone that she was taking prior to admission. We attempted to switch to oxycodone thinking that her pain demand is higher than what she had prior to admission but she says it made her sick so she was not tolerating it. She is to follow-up with her primary care physician at the time of discharge.  Consults: None  Significant Diagnostic Studies: labs: CBCs and CMP is most of them are within normal range  Treatments: IV hydration and analgesia: Dilaudid  Discharge Exam: Blood pressure 121/76, pulse 79, temperature 98 F (36.7 C), temperature source Oral, resp. rate 16, height 5\' 3"  (1.6 m), weight 86.365 kg (190 lb 6.4 oz), last menstrual period 03/10/2015, SpO2 100 %. General appearance: alert, cooperative and no distress Eyes: conjunctivae/corneas clear. PERRL, EOM's intact. Fundi benign. Neck: no adenopathy, no carotid bruit, no JVD, supple, symmetrical, trachea midline and thyroid not enlarged, symmetric, no tenderness/mass/nodules Back: symmetric, no curvature. ROM normal. No CVA tenderness. Resp: clear to auscultation bilaterally Chest wall: no tenderness Cardio: regular rate and rhythm, S1, S2 normal, no murmur, click,  rub or gallop GI: soft, non-tender; bowel sounds normal; no masses,  no organomegaly Extremities: extremities normal, atraumatic, no cyanosis or edema Pulses: 2+ and symmetric Neurologic: Grossly normal  Disposition: 01-Home or Self Care     Medication List    STOP taking these medications        HYDROcodone-acetaminophen 10-325 MG per tablet  Commonly known as:  NORCO     levofloxacin 750 MG tablet  Commonly known as:  LEVAQUIN      TAKE these medications        aspirin 81 MG EC tablet  Take 1 tablet (81 mg total) by mouth daily.     cetirizine 10 MG tablet  Commonly known as:  ZYRTEC  Take 10 mg by mouth daily.     folic acid 1 MG tablet  Commonly known as:  FOLVITE  Take 1 tablet (1 mg total) by mouth daily.     hydroxyurea 500 MG capsule  Commonly known as:  HYDREA  Take 1 capsule (500 mg total) by mouth daily. May take with food to minimize GI side effects.     ibuprofen 800 MG tablet  Commonly known as:  ADVIL,MOTRIN  Take 1 tablet (800 mg total) by mouth 4 (four) times daily.     Oxycodone HCl 10 MG Tabs  Take 0.5 tablets (5 mg total) by mouth every 4 (four) hours.         SignedBarbette Merino 03/16/2015, 7:03 AM

## 2015-03-16 NOTE — Progress Notes (Signed)
I went into the patients room to give scheduled oxycodone. She was very upset about her pain control and stated she had been hurting in her legs and felt like her pain issues had not been addressed properly. I paged the Hospitalist on call and made him aware of above. He declined at that time to change her pain medicine regimen. The patient then asked to speak with the charge nurse (which is me) so I called the [facility] for the hospital tonight to come speak to her. Audree Camel did come speak to her (see her note) and then spoke to the on call hospitalist and new orders for IV pain medicine was ordered. Patient seemed satisfied at this time with the change in her pain medicine.

## 2015-03-17 MED ORDER — HYDROMORPHONE HCL 2 MG/ML IJ SOLN
2.0000 mg | INTRAMUSCULAR | Status: AC | PRN
  Administered 2015-03-17 – 2015-03-18 (×4): 2 mg via INTRAVENOUS
  Filled 2015-03-17 (×4): qty 1

## 2015-03-17 MED ORDER — ZOLPIDEM TARTRATE 5 MG PO TABS
5.0000 mg | ORAL_TABLET | Freq: Once | ORAL | Status: AC
  Administered 2015-03-17: 5 mg via ORAL
  Filled 2015-03-17: qty 1

## 2015-03-17 MED ORDER — ONDANSETRON HCL 4 MG/2ML IJ SOLN
4.0000 mg | INTRAMUSCULAR | Status: DC | PRN
  Administered 2015-03-17: 4 mg via INTRAVENOUS
  Filled 2015-03-17: qty 2

## 2015-03-17 MED ORDER — HYDROCODONE-ACETAMINOPHEN 5-325 MG PO TABS
2.0000 | ORAL_TABLET | Freq: Four times a day (QID) | ORAL | Status: AC | PRN
  Administered 2015-03-17 – 2015-03-18 (×2): 2 via ORAL
  Filled 2015-03-17 (×2): qty 2

## 2015-03-17 NOTE — Progress Notes (Signed)
Subjective: Patient was said to have vomited today after taking her oxycodone. She claimed that the oxycodone is making her vomit more. She also had loci IV and has been having pain at 8 out of 10. She is asking for a return to the PCA. She however does not appear to be in serious crisis at this point. She has previously been on the Vicodin which she tolerated. She has been on IV Dilaudid 2 mg every 3 hours mainly for breakthrough with the initial plan of discharging her home today. No shortness of breath or cough. Her vitals have also remained stable.   Objective: Vital signs in last 24 hours: Temp:  [97.7 F (36.5 C)-99.7 F (37.6 C)] 99.7 F (37.6 C) (05/16 1444) Pulse Rate:  [64-112] 92 (05/16 1444) Resp:  [16-18] 16 (05/16 1444) BP: (103-134)/(66-89) 111/85 mmHg (05/16 1444) SpO2:  [95 %-100 %] 99 % (05/16 1444) Weight change:  Last BM Date: 03/14/15  Intake/Output from previous day: 05/15 0701 - 05/16 0700 In: 600 [P.O.:600] Out: 1100 [Urine:1100] Intake/Output this shift: Total I/O In: 240 [P.O.:240] Out: -   General appearance: alert, cooperative and no distress Head: Normocephalic, without obvious abnormality, atraumatic Neck: no adenopathy, no carotid bruit, no JVD, supple, symmetrical, trachea midline and thyroid not enlarged, symmetric, no tenderness/mass/nodules Back: symmetric, no curvature. ROM normal. No CVA tenderness. Resp: clear to auscultation bilaterally Chest wall: no tenderness Cardio: regular rate and rhythm, S1, S2 normal, no murmur, click, rub or gallop GI: soft, non-tender; bowel sounds normal; no masses,  no organomegaly Extremities: extremities normal, atraumatic, no cyanosis or edema Pulses: 2+ and symmetric Skin: Skin color, texture, turgor normal. No rashes or lesions Neurologic: Grossly normal  Lab Results:  Recent Labs  03/15/15 0445  WBC 9.0  HGB 8.7*  HCT 25.1*  PLT 597*   BMET  Recent Labs  03/15/15 0445  NA 139  K 3.5  CL  104  CO2 27  GLUCOSE 97  BUN 5*  CREATININE 0.40*  CALCIUM 9.0    Studies/Results: No results found.  Medications: I have reviewed the patient's current medications.  Assessment/Plan: A 24 yo admitted with sickle cell painful crisis.   #1. Sickle Cell Painful crisis: I have had a lengthy conversation with the patient. She has repeatedly been difficult. It appears that patient is finding every excuse to be in the hospital. I change her oral medications to oxycodone that she said more likely to help her since she is been on more narcotics lately. Now she is having vomiting according to her. I will switch her back to her home dose of hydrocodone. I will schedule it for overnight with the physician assisted dosing. After informed patient she will be discharged tomorrow on her oral medications unless something happens overnight to change that plan.  #2. Sickle Cell Anemia: Has been stable. I will recheck it in the morning prior to discharging patient  #3. Hypokalemia: repleted. Continue to monitor  #4. 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 Monitor closely.   LOS: 5 days   Emon Miggins,LAWAL 03/17/2015, 4:23 PM

## 2015-03-17 NOTE — Progress Notes (Signed)
Subjective: Patient was discharged this morning but she did not leave because pain has rebounded. She was placed on oxycodone 5 mg tablets 1-2 tablets every 6 hours. She was previously on Vicodin. We've been working with the patient trying to get her out of the hospital for 2 days now. Today however she reported that the pain has gone up to 10 out of 10. She is worried that she'll return to the emergency room if we sent her home today. No fever no cough no shortness of breath.  Objective: Vital signs in last 24 hours: Temp:  [97.7 F (36.5 C)-99.7 F (37.6 C)] 99.7 F (37.6 C) (05/15 1444) Pulse Rate:  [64-112] 92 (05/15 1444) Resp:  [16-18] 16 (05/16 1444) BP: (103-134)/(66-89) 111/85 mmHg (05/15 1444) SpO2:  [95 %-100 %] 99 % (05/15 1444) Weight change:  Last BM Date: 03/14/15  Intake/Output from previous day: 05/14 0701 - 05/15 0700 In: 600 [P.O.:600] Out: 1100 [Urine:1100] Intake/Output this shift: Total I/O In: 240 [P.O.:240] Out: -   General appearance: alert, cooperative and no distress Head: Normocephalic, without obvious abnormality, atraumatic Neck: no adenopathy, no carotid bruit, no JVD, supple, symmetrical, trachea midline and thyroid not enlarged, symmetric, no tenderness/mass/nodules Back: symmetric, no curvature. ROM normal. No CVA tenderness. Resp: clear to auscultation bilaterally Chest wall: no tenderness Cardio: regular rate and rhythm, S1, S2 normal, no murmur, click, rub or gallop GI: soft, non-tender; bowel sounds normal; no masses,  no organomegaly Extremities: extremities normal, atraumatic, no cyanosis or edema Pulses: 2+ and symmetric Skin: Skin color, texture, turgor normal. No rashes or lesions Neurologic: Grossly normal  Lab Results:  Recent Labs  03/15/15 0445  WBC 9.0  HGB 8.7*  HCT 25.1*  PLT 597*   BMET  Recent Labs  03/15/15 0445  NA 139  K 3.5  CL 104  CO2 27  GLUCOSE 97  BUN 5*  CREATININE 0.40*  CALCIUM 9.0     Studies/Results: No results found.  Medications: I have reviewed the patient's current medications.  Assessment/Plan: A 24 yo admitted with sickle cell painful crisis.   #1. Sickle Cell Painful crisis: Patient will be maintain on current regimen with some adjustments. We will hold off on her discharge home today. Follow-up in the morning. She was to take the oxycodone for another 24 hours to see how she does before going home.  #2. Sickle Cell Anemia: H/H stabilized. No evidence of hemolysis  #3. Hypokalemia: repleted. Continue to monitor  #4. 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 Monitor closely.   LOS: 4 days   Jolynne Spurgin,LAWAL 03/16/2015, 4:27 PM

## 2015-03-18 DIAGNOSIS — E876 Hypokalemia: Secondary | ICD-10-CM

## 2015-03-18 DIAGNOSIS — D638 Anemia in other chronic diseases classified elsewhere: Secondary | ICD-10-CM

## 2015-03-18 DIAGNOSIS — D72829 Elevated white blood cell count, unspecified: Secondary | ICD-10-CM

## 2015-03-18 MED ORDER — HYDROCODONE-ACETAMINOPHEN 5-325 MG PO TABS
2.0000 | ORAL_TABLET | Freq: Four times a day (QID) | ORAL | Status: DC | PRN
  Administered 2015-03-18: 2 via ORAL
  Filled 2015-03-18: qty 2

## 2015-03-18 MED ORDER — HYDROMORPHONE HCL 1 MG/ML IJ SOLN
1.0000 mg | INTRAMUSCULAR | Status: DC | PRN
  Administered 2015-03-18: 2 mg via INTRAVENOUS
  Filled 2015-03-18: qty 2

## 2015-03-18 NOTE — Discharge Summary (Signed)
Carmen [name] MRN: [id number] DOB/AGE: 04/24/1991 24 y.o.  Admit date: 03/11/2015 Discharge date: 03/18/2015  Primary Care Physician:  [name], MD   Discharge Diagnoses:   Patient Active Problem List   Diagnosis Date Noted  . HCAP (healthcare-associated pneumonia) 02/28/2015  . Sickle cell crisis 02/28/2015  . Sickle cell anemia with pain 02/16/2015  . Abdominal pain   . 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 Acute calculous cholecystitis s/p lap chole 11/30/2014 11/30/2014  . Hypokalemia 11/28/2014  . Atypical chest pain 11/28/2014  . Leukocytosis 11/22/2014  . Thrombocytosis 11/22/2014  . Anemia 05/16/2014  . Migraine, unspecified, without mention of intractable migraine without mention of status migrainosus 05/16/2014  . Sickle cell pain crisis 11/12/2013    DISCHARGE MEDICATION:   Medication List    STOP taking these medications        levofloxacin 750 MG tablet  Commonly known as:  LEVAQUIN      TAKE these medications        aspirin 81 MG EC tablet  Take 1 tablet (81 mg total) by mouth daily.     cetirizine 10 MG tablet  Commonly known as:  ZYRTEC  Take 10 mg by mouth daily.     folic acid 1 MG tablet  Commonly known as:  FOLVITE  Take 1 tablet (1 mg total) by mouth daily.     HYDROcodone-acetaminophen 10-325 MG per tablet  Commonly known as:  NORCO  Take 1-2 tablets by mouth every 4 (four) hours as needed (for pain).     hydroxyurea 500 MG capsule  Commonly known as:  HYDREA  Take 1 capsule (500 mg total) by mouth daily. May take with food to minimize GI side effects.     ibuprofen 800 MG tablet  Commonly known as:  ADVIL,MOTRIN  Take 1 tablet (800 mg total) by mouth 4 (four) times daily.          Consults:     SIGNIFICANT DIAGNOSTIC STUDIES:  Dg Chest 2 View  02/28/2015   CLINICAL DATA:  Chest pain; sickle cell crisis  EXAM: CHEST  2 VIEW  COMPARISON:  Chest radiograph and chest CT February 25, 2015   FINDINGS: Subtle patchy opacity is noted in both upper lobes, suspicious for patchy pneumonia. Lungs elsewhere appear clear. The heart size and pulmonary vascularity are normal. No adenopathy. Several endplate defects are noted in the thoracic spine, upright representing infarcts from known sickle cell disease.  IMPRESSION: Subtle patchy opacity in each upper lobe. Lungs elsewhere clear. No evidence of volume overload.   Electronically Signed   By: Lowella Grip III M.D.   On: 02/28/2015 19:53   Dg Chest 2 View  02/25/2015   CLINICAL DATA:  Sickle cell pain crisis.  Mid chest pain.  EXAM: CHEST  2 VIEW  COMPARISON:  11/28/2014  FINDINGS: Prominent markings in the right lung base are stable since prior study, likely scarring. Left lung clear. Heart is normal size. No effusions or acute airspace opacities. No acute bony abnormality.  IMPRESSION: No active cardiopulmonary disease.   Electronically Signed   By: Rolm Baptise M.D.   On: 02/25/2015 12:04   Ct Angio Chest Pe W/cm &/or Wo Cm  02/25/2015   CLINICAL DATA:  Two day history of chest tightness and shortness of breath. Sickle cell disease.  EXAM: CT ANGIOGRAPHY CHEST WITH CONTRAST  TECHNIQUE: Multidetector CT imaging of the chest was performed using the standard protocol during bolus administration  of intravenous contrast. Multiplanar CT image reconstructions and MIPs were obtained to evaluate the vascular anatomy.  CONTRAST:  100 mL Omnipaque 350 nonionic  COMPARISON:  Chest CT February 03, 2014; chest radiograph February 25, 2015  FINDINGS: There is no demonstrable pulmonary embolus. There is no thoracic aortic aneurysm or dissection.  Several focal areas of opacity are noted in the superior segment of the left lower lobe consistent with multifocal pneumonia. A small area of pneumonia is also noted in the posterior segment of the left lower lobe. There is patchy atelectasis in both lung bases, slightly more on the right than on the left. Mild atelectatic  changes also noted in the inferior aspect of the right middle lobe and lingula. A small amount of ill-defined opacity is noted in the posterior segment right upper lobe consistent with either atelectasis or the earliest changes of pneumonia in this area.  There is residual thymic tissue, normal for age. There is no appreciable thoracic adenopathy. The pericardium is not thickened. Thyroid appears unremarkable.  In the visualized upper abdomen, there is evidence of previous splenic infarct. There is hepatic steatosis.  Bony structures show evidence of sickle cell disease with areas of thoracic end plate infarcts as well as patchy areas of sclerotic change.  Review of the MIP images confirms the above findings.  IMPRESSION: Areas of pneumonia in the left lower lobe, primarily in the superior segment. Suspect early pneumonia in the posterior segment of the right upper lobe. There are areas of patchy atelectasis as outlined above.  No demonstrable pulmonary embolus.  Bony changes of sickle cell disease. Status post splenic infarct. Hepatic steatosis present.   Electronically Signed   By: Lowella Grip III M.D.   On: 02/25/2015 20:04      No results found for this or any previous visit (from the past 240 hour(s)).  BRIEF ADMITTING H & P: Carmen [name] is a 24 y.o. female with history of sickle cell anemia who was recently admitted for healthcare associated pneumonia presents to the ER because of low back pain. Patient states she started developing low back pain since morning. Denies any dysuria discharges fever chills nausea vomiting or diarrhea. Denies any chest pain or shortness of breath or any focal deficits. Despite giving multiple doses of pain related medications patient still has pain. Patient has been admitted for further pain management secondary to sickle cell pain crisis.   Hospital Course:  Present on Admission:  . Sickle cell crisis: Pt was admitted for the 3rd time in 1 month for pain  episode after she started a new job. She was treated with IV dilaudid via PCA, Toradol and IVF. She was weaned off PCA and transitioned to oral analgesics She was started on Oxycodone which induced Nausea dn vomiting. She was subsequently changed to Vic[place] which she tolerated well. At the time of discharge she has minimal pain localized to her LE's but is functional with ADL's and ambulating without difficulty. Her saturations are at 100% on ambient air.   . Anemia of Chronic Disease: Pt has a chronic anemia and at the time of discharge is at baseline of 8.7. She has been prescribed Hydrea but has not yet obtained it from the Pharmacy. Additionally she is not sure who she will ultimately follow up with.   . Leukocytosis: Pt had a mild leukocytosis which has resolved without antibiotics. She had no evidence of infection.   . Hypokalemia: Pt  Was managed with oral replacement. Potassium levels normal  at time of discharge  Disposition and Follow-up:  Pt is discharged d in good condition. She has been instructed by her PMD to call for an appointment at the time of discharge.     Discharge Instructions    Activity as tolerated - No restrictions    Complete by:  As directed      Diet general    Complete by:  As directed            DISCHARGE EXAM:  General: Alert, awake, oriented x3, in mild distress.  HEENT: [place]/AT PEERL, EOMI, mild icterus Neck: Trachea midline, no masses, no thyromegal,y no JVD, no carotid bruit OROPHARYNX: Moist, No exudate/ erythema/lesions.  Heart: Regular rate and rhythm, without murmurs, rubs, gallops or S3. PMI non-displaced. Exam reveals no decreased pulses. Pulmonary/Chest: Normal effort. Breath sounds normal. No. Apnea. Clear to auscultation,no stridor,  no wheezing and no rhonchi noted. No respiratory distress and no tenderness noted. Abdomen: Soft, nontender, nondistended, normal bowel sounds, no masses no hepatosplenomegaly noted. No fluid wave and no ascites.  There is no guarding or rebound. Neuro: Alert and oriented to person, place and time. Normal motor skills, Displays no atrophy or tremors and exhibits normal muscle tone.  No focal neurological deficits noted cranial nerves II through XII grossly intact. No sensory deficit noted. Strength at baseline in bilateral upper and lower extremities. Gait normal. Musculoskeletal: No warm swelling or erythema around joints, no spinal tenderness noted. Psychiatric: Patient alert and oriented x3, good insight and cognition, good recent to remote recall. Lymph node survey: No cervical axillary or inguinal lymphadenopathy noted. Skin: Skin is warm and dry. No bruising, no ecchymosis and no rash noted. Pt is not diaphoretic. No erythema. No pallor Psychiatric: Mood, memory, affect and judgement normal   Blood pressure 126/80, pulse 69, temperature 98.4 F (36.9 C), temperature source Oral, resp. rate 16, height 5\' 3"  (1.6 m), weight 184 lb 11.2 oz (83.779 kg), last menstrual period 03/10/2015, SpO2 100 %.  No results for input(s): NA, K, CL, CO2, GLUCOSE, BUN, CREATININE, CALCIUM, MG, PHOS in the last 72 hours. No results for input(s): AST, ALT, ALKPHOS, BILITOT, PROT, ALBUMIN in the last 72 hours. No results for input(s): LIPASE, AMYLASE in the last 72 hours. No results for input(s): WBC, NEUTROABS, HGB, HCT, MCV, PLT in the last 72 hours.   Total time spent including face to face and decision making was greater than 30 minutes  Signed: Valbona Slabach A. 03/18/2015, 11:47 AM

## 2015-03-18 NOTE — Progress Notes (Signed)
Pt was given discharge instructions, prescriptions, and follow-up appointments and pt verbalized understanding. Pt denied any complaints at the time of discharge. Pt wanted to be discharged so that she could go and visit with family and friends on 4th floor. Pt vitals were stable at the time of discharge.

## 2015-05-14 ENCOUNTER — Emergency Department (HOSPITAL_COMMUNITY)
Admission: EM | Admit: 2015-05-14 | Discharge: 2015-05-14 | Disposition: A | Discharge status: Home / Self Care | Payer: Medicaid Other | Attending: Emergency Medicine | Admitting: Emergency Medicine

## 2015-05-14 ENCOUNTER — Encounter (HOSPITAL_COMMUNITY): Payer: Self-pay

## 2015-05-14 DIAGNOSIS — Z79899 Other long term (current) drug therapy: Secondary | ICD-10-CM | POA: Insufficient documentation

## 2015-05-14 DIAGNOSIS — Z8659 Personal history of other mental and behavioral disorders: Secondary | ICD-10-CM | POA: Diagnosis not present

## 2015-05-14 DIAGNOSIS — D5700 Hb-SS disease with crisis, unspecified: Secondary | ICD-10-CM | POA: Insufficient documentation

## 2015-05-14 DIAGNOSIS — Z791 Long term (current) use of non-steroidal anti-inflammatories (NSAID): Secondary | ICD-10-CM | POA: Insufficient documentation

## 2015-05-14 LAB — COMPREHENSIVE METABOLIC PANEL
ALBUMIN: 4.4 g/dL (ref 3.5–5.0)
ALT: 12 U/L — ABNORMAL LOW (ref 14–54)
ANION GAP: 7 (ref 5–15)
AST: 17 U/L (ref 15–41)
Alkaline Phosphatase: 57 U/L (ref 38–126)
BUN: 13 mg/dL (ref 6–20)
CALCIUM: 9.4 mg/dL (ref 8.9–10.3)
CHLORIDE: 110 mmol/L (ref 101–111)
CO2: 22 mmol/L (ref 22–32)
CREATININE: 0.53 mg/dL (ref 0.44–1.00)
GFR calc Af Amer: >60 mL/min (ref >60)
GFR calc non Af Amer: >60 mL/min (ref >60)
Glucose, Bld: 85 mg/dL (ref 65–99)
POTASSIUM: 3.5 mmol/L (ref 3.5–5.1)
Sodium: 139 mmol/L (ref 135–145)
TOTAL PROTEIN: 8.3 g/dL — AB (ref 6.5–8.1)
Total Bilirubin: 1.6 mg/dL — ABNORMAL HIGH (ref 0.3–1.2)

## 2015-05-14 LAB — CBC WITH DIFFERENTIAL/PLATELET
BASOS ABS: 0 10*3/uL (ref 0.0–0.1)
BASOS PCT: 0 % (ref 0–1)
EOS PCT: 1 % (ref 0–5)
Eosinophils Absolute: 0.1 10*3/uL (ref 0.0–0.7)
HCT: 25.6 % — ABNORMAL LOW (ref 36.0–46.0)
Hemoglobin: 8.9 g/dL — ABNORMAL LOW (ref 12.0–15.0)
Lymphocytes Relative: 31 % (ref 12–46)
Lymphs Abs: 3.3 10*3/uL (ref 0.7–4.0)
MCH: 29.3 pg (ref 26.0–34.0)
MCHC: 34.8 g/dL (ref 30.0–36.0)
MCV: 84.2 fL (ref 78.0–100.0)
Monocytes Absolute: 1.2 10*3/uL — ABNORMAL HIGH (ref 0.1–1.0)
Monocytes Relative: 11 % (ref 3–12)
NEUTROS ABS: 6.2 10*3/uL (ref 1.7–7.7)
NEUTROS PCT: 57 % (ref 43–77)
Platelets: 541 10*3/uL — ABNORMAL HIGH (ref 150–400)
RBC: 3.04 MIL/uL — ABNORMAL LOW (ref 3.87–5.11)
RDW: 17.5 % — AB (ref 11.5–15.5)
WBC: 10.7 10*3/uL — AB (ref 4.0–10.5)

## 2015-05-14 LAB — RETICULOCYTES
RBC.: 3.05 MIL/uL — ABNORMAL LOW (ref 3.87–5.11)
RETIC COUNT ABSOLUTE: 338.6 10*3/uL — AB (ref 19.0–186.0)
Retic Ct Pct: 11.1 % — ABNORMAL HIGH (ref 0.4–3.1)

## 2015-05-14 MED ORDER — OXYCODONE-ACETAMINOPHEN 5-325 MG PO TABS: 1.0000 | ORAL_TABLET | Freq: Four times a day (QID) | ORAL | Status: DC | PRN

## 2015-05-14 MED ORDER — HYDROMORPHONE HCL 1 MG/ML IJ SOLN
1.0000 mg | Freq: Once | INTRAMUSCULAR | Status: AC
  Administered 2015-05-14: 1 mg via INTRAVENOUS
  Filled 2015-05-14: qty 1

## 2015-05-14 MED ORDER — OXYCODONE-ACETAMINOPHEN 5-325 MG PO TABS
1.0000 | ORAL_TABLET | Freq: Once | ORAL | Status: AC
  Administered 2015-05-14: 1 via ORAL
  Filled 2015-05-14: qty 1

## 2015-05-14 MED ORDER — ONDANSETRON HCL 4 MG/2ML IJ SOLN
4.0000 mg | Freq: Once | INTRAMUSCULAR | Status: AC
  Administered 2015-05-14: 4 mg via INTRAVENOUS
  Filled 2015-05-14: qty 2

## 2015-05-14 MED ORDER — SODIUM CHLORIDE 0.9 % IV SOLN
Freq: Once | INTRAVENOUS | Status: AC
  Administered 2015-05-14: 11:00:00 via INTRAVENOUS

## 2015-05-14 NOTE — ED Notes (Signed)
Pt with lower extremity pain since this morning.  Pt has sickle cell.  Last crisis x 5 months ago.  No fever.

## 2015-05-14 NOTE — ED Provider Notes (Signed)
CSN: [id number]     Arrival date & time 05/14/15  1448 History   First MD Initiated Contact with Patient 05/14/15 [phone]     Chief Complaint  Patient presents with  . Sickle Cell Pain Crisis     (Consider location/radiation/quality/duration/timing/severity/associated sxs/prior Treatment) HPI Comments: Patients with history of sickle cell anemia presents with complaint of bilateral lower extremity pain starting last evening. She thinks it was exacerbated by sleeping in a cold room. Her only symptom is pain in her legs. No chest pain or shortness of breath. No fevers. No other infectious symptoms or urinary symptoms. Patient took ibuprofen and Tylenol at home last night which seemed to help but symptoms were severe this morning so she decided to come to the emergency department. Patient has been on hydroxyurea in the past but is no longer taking this. She also takes Vicodin on an as-needed basis, but was switched to Percocet 2 weeks ago and has not yet gotten this prescription. She has had no narcotics in the past 2 weeks. Onset of the symptoms have been acute. Course is constant. Nothing makes symptoms better or worse.  Patient is a 24 y.o. female presenting with sickle cell pain. The history is provided by the patient.  Sickle Cell Pain Crisis Associated symptoms: no chest pain, no cough, no fever, no headaches, no nausea, no sore throat and no vomiting     Past Medical History  Diagnosis Date  . Sickle cell crisis   . Depression   . Headache(784.0)   . Abortion     05/2012   Past Surgical History  Procedure Laterality Date  . Splenectomy    . Cholecystectomy N/A 11/30/2014    Procedure: LAPAROSCOPIC CHOLECYSTECTOMY SINGLE SITE WITH INTRAOPERATIVE CHOLANGIOGRAM;  Surgeon: Michael Boston, MD;  Location: [facility];  Service: General;  Laterality: N/A;   Family History  Problem Relation Age of Onset  . Hypertension Mother   . Sickle cell anemia Sister   . Kidney disease Sister   . Arthritis  Sister   . Sickle cell anemia Sister   . Sickle cell trait Sister   . Heart disease Maternal Aunt   . Heart disease Maternal Uncle    History  Substance Use Topics  . Smoking status: Never Smoker   . Smokeless tobacco: Never Used  . Alcohol Use: No   OB History    Gravida Para Term Preterm AB TAB SAB Ectopic Multiple Living   1    1          Review of Systems  Constitutional: Negative for fever.  HENT: Negative for rhinorrhea and sore throat.   Eyes: Negative for redness.  Respiratory: Negative for cough.   Cardiovascular: Negative for chest pain.  Gastrointestinal: Negative for nausea, vomiting, abdominal pain and diarrhea.  Genitourinary: Negative for dysuria.  Musculoskeletal: Positive for myalgias and arthralgias.  Skin: Negative for rash.  Neurological: Negative for headaches.      Allergies  Other  Home Medications   Prior to Admission medications   Medication Sig Start Date End Date Taking? Authorizing Provider  acetaminophen (TYLENOL) 325 MG tablet Take 325 mg by mouth every 6 (six) hours as needed for mild pain or moderate pain.   Yes Historical Provider, MD  cetirizine (ZYRTEC) 10 MG tablet Take 10 mg by mouth daily.    Yes Historical Provider, MD  ibuprofen (ADVIL,MOTRIN) 800 MG tablet Take 1 tablet (800 mg total) by mouth 4 (four) times daily. 03/16/15  Yes Elwyn Reach,  MD  aspirin EC 81 MG EC tablet Take 1 tablet (81 mg total) by mouth daily. Patient not taking: Reported on 02/28/2015 02/19/15   Leana Gamer, MD  folic acid (FOLVITE) 1 MG tablet Take 1 tablet (1 mg total) by mouth daily. Patient not taking: Reported on 02/28/2015 02/19/15   Leana Gamer, MD  hydroxyurea (HYDREA) 500 MG capsule Take 1 capsule (500 mg total) by mouth daily. May take with food to minimize GI side effects. Patient not taking: Reported on 02/28/2015 02/22/15   Doy Hutching, MD   BP 117/68 mmHg  Pulse 81  Temp(Src) 98.4 F (36.9 C) (Oral)  Resp 18  Ht 5'  3" (1.6 m)  Wt 175 lb (79.379 kg)  BMI 31.01 kg/m2  SpO2 100%  LMP 05/09/2015 Physical Exam  Constitutional: She appears well-developed and well-nourished.  HENT:  Head: Normocephalic and atraumatic.  Eyes: Conjunctivae are normal. Right eye exhibits no discharge. Left eye exhibits no discharge.  Neck: Normal range of motion. Neck supple.  Cardiovascular: Normal rate, regular rhythm and normal heart sounds.   Pulses:      Dorsalis pedis pulses are 2+ on the right side, and 2+ on the left side.  Pulmonary/Chest: Effort normal and breath sounds normal.  Abdominal: Soft. There is no tenderness.  Musculoskeletal:       Right hip: Normal.       Left hip: Normal.       Right knee: Normal. She exhibits normal range of motion, no swelling and no effusion.       Left knee: Normal. She exhibits normal range of motion, no swelling and no effusion.       Right ankle: Normal.       Left ankle: Normal.       Lumbar back: Normal.       Right upper leg: She exhibits tenderness. She exhibits no swelling.       Left upper leg: She exhibits tenderness. She exhibits no swelling.       Right lower leg: She exhibits tenderness. She exhibits no swelling.       Left lower leg: She exhibits tenderness. She exhibits no swelling.  Neurological: She is alert.  Skin: Skin is warm and dry.  Psychiatric: She has a normal mood and affect.  Nursing note and vitals reviewed.   ED Course  Procedures (including critical care time) Labs Review Labs Reviewed  COMPREHENSIVE METABOLIC PANEL - Abnormal; Notable for the following:    Total Protein 8.3 (*)    ALT 12 (*)    Total Bilirubin 1.6 (*)    All other components within normal limits  CBC WITH DIFFERENTIAL/PLATELET - Abnormal; Notable for the following:    WBC 10.7 (*)    RBC 3.04 (*)    Hemoglobin 8.9 (*)    HCT 25.6 (*)    RDW 17.5 (*)    Platelets 541 (*)    Monocytes Absolute 1.2 (*)    All other components within normal limits  RETICULOCYTES -  Abnormal; Notable for the following:    Retic Ct Pct 11.1 (*)    RBC. 3.05 (*)    Retic Count, Manual 338.6 (*)    All other components within normal limits    Imaging Review No results found.   EKG Interpretation None       11:11 AM Patient seen and examined. Work-up initiated. Medications ordered.   Vital signs reviewed and are as follows: BP 117/68 mmHg  Pulse  81  Temp(Src) 98.4 F (36.9 C) (Oral)  Resp 18  Ht 5\' 3"  (1.6 m)  Wt 175 lb (79.379 kg)  BMI 31.01 kg/m2  SpO2 100%  LMP 05/09/2015  Patient feeling much improved after 3 doses of IV Dilaudid. Will give oral Percocet prior to discharge and medication to use temporarily at home.  Patient urged to return with worsening symptoms or other concerns. Patient verbalized understanding and agrees with plan.    MDM   Final diagnoses:  Sickle cell anemia with pain   Patient with probable vaso-occlusive sickle cell crisis with pain in legs only. No chest pain or fever suggestive of infection or acute chest syndrome. Vital signs are normal. Patient is tolerating fluids. Pain controlled in ED with parenteral pain medications. Patient states that she wants to go home and follow-up with her PCP. No indications for admission at this time. Legs are generally tender. No evidence of hemarthrosis or infection.  No dangerous or life-threatening conditions suspected or identified by history, physical exam, and by work-up. No indications for hospitalization identified.      Carlisle Cater, PA-C 05/14/15 [place], MD 05/16/15 2157

## 2015-05-14 NOTE — ED Notes (Signed)
ED PA at bedside

## 2015-05-14 NOTE — Discharge Instructions (Signed)
Please read and follow all provided instructions.  Your diagnoses today include:  1. Sickle cell anemia with pain    Tests performed today include:  Blood counts and electrolytes - shows anemia that is your normal  Vital signs. See below for your results today.   Medications prescribed:   Percocet (oxycodone/acetaminophen) - narcotic pain medication  DO NOT drive or perform any activities that require you to be awake and alert because this medicine can make you drowsy. BE VERY CAREFUL not to take multiple medicines containing Tylenol (also called acetaminophen). Doing so can lead to an overdose which can damage your liver and cause liver failure and possibly death.  Take any prescribed medications only as directed.  Home care instructions:  Follow any educational materials contained in this packet.  BE VERY CAREFUL not to take multiple medicines containing Tylenol (also called acetaminophen). Doing so can lead to an overdose which can damage your liver and cause liver failure and possibly death.   Follow-up instructions: Please follow-up with your primary care provider in the next 3 days for further evaluation of your symptoms.   Return instructions:   Please return to the Emergency Department if you experience worsening symptoms.   Please return if you have any other emergent concerns.  Additional Information:  Your vital signs today were: BP 123/71 mmHg   Pulse 77   Temp(Src) 98.4 F (36.9 C) (Oral)   Resp 18   Ht 5\' 3"  (1.6 m)   Wt 175 lb (79.379 kg)   BMI 31.01 kg/m2   SpO2 97%   LMP 05/09/2015 If your blood pressure (BP) was elevated above 135/85 this visit, please have this repeated by your doctor within one month. --------------

## 2015-07-19 ENCOUNTER — Emergency Department (HOSPITAL_COMMUNITY): Payer: Medicaid Other

## 2015-07-19 ENCOUNTER — Emergency Department (HOSPITAL_COMMUNITY)
Admission: EM | Admit: 2015-07-19 | Discharge: 2015-07-20 | Disposition: A | Discharge status: Home / Self Care | Payer: Medicaid Other | Attending: Physician Assistant | Admitting: Physician Assistant

## 2015-07-19 ENCOUNTER — Encounter (HOSPITAL_COMMUNITY): Payer: Self-pay | Admitting: Emergency Medicine

## 2015-07-19 DIAGNOSIS — R05 Cough: Secondary | ICD-10-CM | POA: Diagnosis present

## 2015-07-19 DIAGNOSIS — J069 Acute upper respiratory infection, unspecified: Secondary | ICD-10-CM

## 2015-07-19 DIAGNOSIS — Z8659 Personal history of other mental and behavioral disorders: Secondary | ICD-10-CM | POA: Diagnosis not present

## 2015-07-19 DIAGNOSIS — D5700 Hb-SS disease with crisis, unspecified: Secondary | ICD-10-CM | POA: Insufficient documentation

## 2015-07-19 DIAGNOSIS — Z79899 Other long term (current) drug therapy: Secondary | ICD-10-CM | POA: Diagnosis not present

## 2015-07-19 DIAGNOSIS — N390 Urinary tract infection, site not specified: Secondary | ICD-10-CM | POA: Diagnosis not present

## 2015-07-19 LAB — CBC WITH DIFFERENTIAL/PLATELET
Basophils Absolute: 0 10*3/uL (ref 0.0–0.1)
Basophils Relative: 0 %
Eosinophils Absolute: 0 10*3/uL (ref 0.0–0.7)
Eosinophils Relative: 0 %
HEMATOCRIT: 27.3 % — AB (ref 36.0–46.0)
Hemoglobin: 9.6 g/dL — ABNORMAL LOW (ref 12.0–15.0)
Lymphocytes Relative: 15 %
Lymphs Abs: 2.8 10*3/uL (ref 0.7–4.0)
MCH: 29.6 pg (ref 26.0–34.0)
MCHC: 35.2 g/dL (ref 30.0–36.0)
MCV: 84.3 fL (ref 78.0–100.0)
MONOS PCT: 9 %
Monocytes Absolute: 1.6 10*3/uL — ABNORMAL HIGH (ref 0.1–1.0)
NEUTROS ABS: 14 10*3/uL — AB (ref 1.7–7.7)
Neutrophils Relative %: 76 %
Platelets: 551 10*3/uL — ABNORMAL HIGH (ref 150–400)
RBC: 3.24 MIL/uL — ABNORMAL LOW (ref 3.87–5.11)
RDW: 14.8 % (ref 11.5–15.5)
WBC: 18.4 10*3/uL — ABNORMAL HIGH (ref 4.0–10.5)

## 2015-07-19 LAB — URINE MICROSCOPIC-ADD ON

## 2015-07-19 LAB — COMPREHENSIVE METABOLIC PANEL
ALT: 23 U/L (ref 14–54)
AST: 23 U/L (ref 15–41)
Albumin: 4.5 g/dL (ref 3.5–5.0)
Alkaline Phosphatase: 69 U/L (ref 38–126)
Anion gap: 7 (ref 5–15)
BUN: 6 mg/dL (ref 6–20)
CO2: 24 mmol/L (ref 22–32)
Calcium: 9.3 mg/dL (ref 8.9–10.3)
Chloride: 106 mmol/L (ref 101–111)
Creatinine, Ser: 0.47 mg/dL (ref 0.44–1.00)
GFR calc non Af Amer: >60 mL/min (ref >60)
Glucose, Bld: 91 mg/dL (ref 65–99)
POTASSIUM: 3.5 mmol/L (ref 3.5–5.1)
Sodium: 137 mmol/L (ref 135–145)
Total Bilirubin: 2.1 mg/dL — ABNORMAL HIGH (ref 0.3–1.2)
Total Protein: 8.3 g/dL — ABNORMAL HIGH (ref 6.5–8.1)

## 2015-07-19 LAB — URINALYSIS, ROUTINE W REFLEX MICROSCOPIC
Bilirubin Urine: NEGATIVE
Glucose, UA: NEGATIVE mg/dL
Ketones, ur: NEGATIVE mg/dL
Nitrite: NEGATIVE
Protein, ur: NEGATIVE mg/dL
SPECIFIC GRAVITY, URINE: 1.01 (ref 1.005–1.030)
UROBILINOGEN UA: 2 mg/dL — AB (ref 0.0–1.0)
pH: 6 (ref 5.0–8.0)

## 2015-07-19 LAB — RETICULOCYTES
RBC.: 3.26 MIL/uL — ABNORMAL LOW (ref 3.87–5.11)
RETIC COUNT ABSOLUTE: 260.8 10*3/uL — AB (ref 19.0–186.0)
RETIC CT PCT: 8 % — AB (ref 0.4–3.1)

## 2015-07-19 MED ORDER — SODIUM CHLORIDE 0.9 % IV BOLUS (SEPSIS)
1000.0000 mL | Freq: Once | INTRAVENOUS | Status: AC
  Administered 2015-07-19: 1000 mL via INTRAVENOUS

## 2015-07-19 MED ORDER — OXYCODONE-ACETAMINOPHEN 10-325 MG PO TABS: 1.0000 | ORAL_TABLET | ORAL | Status: DC | PRN

## 2015-07-19 MED ORDER — CEPHALEXIN 500 MG PO CAPS: 500.0000 mg | ORAL_CAPSULE | Freq: Two times a day (BID) | ORAL | Status: DC

## 2015-07-19 MED ORDER — KETOROLAC TROMETHAMINE 30 MG/ML IJ SOLN
30.0000 mg | INTRAMUSCULAR | Status: AC
  Administered 2015-07-19: 30 mg via INTRAVENOUS
  Filled 2015-07-19: qty 1

## 2015-07-19 MED ORDER — DEXTROSE 5 % IV SOLN
1.0000 g | Freq: Once | INTRAVENOUS | Status: AC
  Administered 2015-07-19: 1 g via INTRAVENOUS
  Filled 2015-07-19: qty 10

## 2015-07-19 MED ORDER — HYDROMORPHONE HCL 2 MG/ML IJ SOLN
1.5000 mg | Freq: Once | INTRAMUSCULAR | Status: DC | PRN
  Administered 2015-07-19: 1.5 mg via INTRAVENOUS
  Filled 2015-07-19: qty 1

## 2015-07-19 MED ORDER — BENZONATATE 200 MG PO CAPS: 200.0000 mg | ORAL_CAPSULE | Freq: Three times a day (TID) | ORAL | Status: DC | PRN

## 2015-07-19 MED ORDER — HYDROMORPHONE HCL 2 MG/ML IJ SOLN: 2.0000 mg | Freq: Once | INTRAMUSCULAR | Status: DC

## 2015-07-19 MED ORDER — DIPHENHYDRAMINE HCL 50 MG/ML IJ SOLN
25.0000 mg | Freq: Once | INTRAMUSCULAR | Status: AC
  Administered 2015-07-19: 25 mg via INTRAVENOUS
  Filled 2015-07-19: qty 1

## 2015-07-19 MED ORDER — HYDROMORPHONE HCL 2 MG/ML IJ SOLN
2.0000 mg | Freq: Once | INTRAMUSCULAR | Status: DC | PRN
  Administered 2015-07-19: 2 mg via INTRAVENOUS
  Filled 2015-07-19: qty 1

## 2015-07-19 MED ORDER — HYDROMORPHONE HCL 1 MG/ML IJ SOLN
1.0000 mg | Freq: Once | INTRAMUSCULAR | Status: AC
  Administered 2015-07-19: 1 mg via INTRAVENOUS
  Filled 2015-07-19: qty 1

## 2015-07-19 NOTE — ED Notes (Signed)
IV team RN here for IV start.

## 2015-07-19 NOTE — ED Notes (Signed)
Pt ambulatory to exam room with steady gait.  

## 2015-07-19 NOTE — ED Notes (Signed)
PA at bedside.

## 2015-07-19 NOTE — ED Notes (Signed)
Delay in lab draw PA in with pt

## 2015-07-19 NOTE — ED Notes (Signed)
Per EMS-cold symptoms since yesterday-body aches

## 2015-07-19 NOTE — ED Provider Notes (Signed)
CSN: [id number]     Arrival date & time 07/19/15  1753 History   First MD Initiated Contact with Patient 07/19/15 1834     Chief Complaint  Patient presents with  . URI  . Sickle Cell Pain Crisis     (Consider location/radiation/quality/duration/timing/severity/associated sxs/prior Treatment) HPI  Carmen [name] Is a 24 year old female with past medical history of sickle cell disease. She is followed by Dr. [name].. Patient states that she developed symptoms of URI yesterday including runny nose, cough, pain with cough, headache which is worse with cough, body aches. She denies fevers at home, or sore throat. Patient denies history of smoking or asthma. She is complaining of pain in her chest but only with cough. She is her normal sickle cell pain crisis in her back and bilateral legs. She denies any abdominal pain, nausea, vomiting, diarrhea or urinary symptoms. Patient denies any neurologic symptoms with her headaches as well as photophobia, phonophobia, unilateral weakness, changes in her vision. She does not take any hydroxyurea. She was told by her primary care physician that it doesn't really work. Past Medical History  Diagnosis Date  . Sickle cell crisis   . Depression   . Headache(784.0)   . Abortion     05/2012   Past Surgical History  Procedure Laterality Date  . Splenectomy    . Cholecystectomy N/A 11/30/2014    Procedure: LAPAROSCOPIC CHOLECYSTECTOMY SINGLE SITE WITH INTRAOPERATIVE CHOLANGIOGRAM;  Surgeon: Michael Boston, MD;  Location: [facility];  Service: General;  Laterality: N/A;   Family History  Problem Relation Age of Onset  . Hypertension Mother   . Sickle cell anemia Sister   . Kidney disease Sister   . Arthritis Sister   . Sickle cell anemia Sister   . Sickle cell trait Sister   . Heart disease Maternal Aunt   . Heart disease Maternal Uncle    Social History  Substance Use Topics  . Smoking status: Never Smoker   . Smokeless tobacco: Never Used  .  Alcohol Use: No   OB History    Gravida Para Term Preterm AB TAB SAB Ectopic Multiple Living   1    1          Review of Systems  Ten systems reviewed and are negative for acute change, except as noted in the HPI.    Allergies  Other  Home Medications   Prior to Admission medications   Medication Sig Start Date End Date Taking? Authorizing Provider  acetaminophen (TYLENOL) 325 MG tablet Take 325 mg by mouth every 6 (six) hours as needed for mild pain or moderate pain.    Historical Provider, MD  cetirizine (ZYRTEC) 10 MG tablet Take 10 mg by mouth daily.     Historical Provider, MD  ibuprofen (ADVIL,MOTRIN) 800 MG tablet Take 1 tablet (800 mg total) by mouth 4 (four) times daily. 03/16/15   Elwyn Reach, MD  oxyCODONE-acetaminophen (PERCOCET/ROXICET) 5-325 MG per tablet Take 1-2 tablets by mouth every 6 (six) hours as needed for severe pain. 05/14/15   Carlisle Cater, PA-C   BP 126/72 mmHg  Pulse 104  Temp(Src) 100.3 F (37.9 C) (Oral)  Resp 18  SpO2 98%  LMP 07/01/2015 Physical Exam  Constitutional: She is oriented to person, place, and time. She appears well-developed and well-nourished. No distress.  HENT:  Head: Normocephalic and atraumatic.  Eyes: Conjunctivae and EOM are normal. Pupils are equal, round, and reactive to light. No scleral icterus.  Neck: Normal range  of motion.  Cardiovascular: Normal rate, regular rhythm and normal heart sounds.  Exam reveals no gallop and no friction rub.   No murmur heard. Pulmonary/Chest: Effort normal and breath sounds normal. No respiratory distress. She has no wheezes. She has no rales.  Abdominal: Soft. Bowel sounds are normal. She exhibits no distension and no mass. There is no tenderness. There is no guarding.  Musculoskeletal: Normal range of motion.  Neurological: She is alert and oriented to person, place, and time.  Skin: Skin is warm and dry. She is not diaphoretic.  Nursing note and vitals reviewed.   ED Course   Procedures (including critical care time) Labs Review Labs Reviewed  CBC WITH DIFFERENTIAL/PLATELET - Abnormal; Notable for the following:    WBC 18.4 (*)    RBC 3.24 (*)    Hemoglobin 9.6 (*)    HCT 27.3 (*)    Platelets 551 (*)    Neutro Abs 14.0 (*)    Monocytes Absolute 1.6 (*)    All other components within normal limits  COMPREHENSIVE METABOLIC PANEL  URINALYSIS, ROUTINE W REFLEX MICROSCOPIC (NOT AT [facility])  RETICULOCYTES    Imaging Review Dg Chest 2 View  07/19/2015   CLINICAL DATA:  Congestion, cough, body aches. Symptoms for 2 days. History of pneumonia.  EXAM: CHEST  2 VIEW  COMPARISON:  02/28/2015  FINDINGS: The heart size and mediastinal contours are within normal limits. Both lungs are clear. No pleural effusion or pneumothorax. The visualized skeletal structures are unremarkable.  IMPRESSION: No active cardiopulmonary disease.   Electronically Signed   By: Lajean Manes M.D.   On: 07/19/2015 18:31   I have personally reviewed and evaluated these images and lab results as part of my medical decision-making.   EKG Interpretation None      MDM   Final diagnoses:  None    7:08 PM Patient here with URI. SS pain crisis. cxr without signs of acute chest, no concern for SAH, meningitis. Will attempt pain control.  11:12 PM BP 116/74 mmHg  Pulse 90  Temp(Src) 99.9 F (37.7 C) (Oral)  Resp 20  SpO2 100%  LMP 07/01/2015 Patient with UTI. Her chest pain is resolved but she continues to have severe pain in her back and legs.  Will give last dose of dilaudid.   Patient pain greatly improved.  will discharge with pain meds.    Margarita Mail, PA-C 07/24/15 [place], MD 07/24/15 [phone]

## 2015-07-20 NOTE — Discharge Instructions (Signed)
Sickle Cell Anemia, Adult °Sickle cell anemia is a condition in which red blood cells have an abnormal "sickle" shape. This abnormal shape shortens the cells' life span, which results in a lower than normal concentration of red blood cells in the blood. The sickle shape also causes the cells to clump together and block free blood flow through the blood vessels. As a result, the tissues and organs of the body do not receive enough oxygen. Sickle cell anemia causes organ damage and pain and increases the risk of infection. °CAUSES  °Sickle cell anemia is a genetic disorder. Those who receive two copies of the gene have the condition, and those who receive one copy have the trait. °RISK FACTORS °The sickle cell gene is most common in people whose families originated in [place]. Other areas of the globe where sickle cell trait occurs include the [place], [place] and [place], the [place], and the [place].  °SIGNS AND SYMPTOMS °· Pain, especially in the extremities, back, chest, or abdomen (common). The pain may start suddenly or may develop following an illness, especially if there is dehydration. Pain can also occur due to overexertion or exposure to extreme temperature changes. °· Frequent severe bacterial infections, especially certain types of pneumonia and meningitis. °· Pain and swelling in the hands and feet. °· Decreased activity.   °· Loss of appetite.   °· Change in behavior. °· Headaches. °· Seizures. °· Shortness of breath or difficulty breathing. °· Vision changes. °· Skin ulcers. °Those with the trait may not have symptoms or they may have mild symptoms.  °DIAGNOSIS  °Sickle cell anemia is diagnosed with blood tests that demonstrate the genetic trait. It is often diagnosed during the newborn period, due to mandatory testing nationwide. A variety of blood tests, X-rays, CT scans, MRI scans, ultrasounds, and lung function tests may also be done to monitor the condition. °TREATMENT  °Sickle  cell anemia may be treated with: °· Medicines. You may be given pain medicines, antibiotic medicines (to treat and prevent infections) or medicines to increase the production of certain types of hemoglobin. °· Fluids. °· Oxygen. °· Blood transfusions. °HOME CARE INSTRUCTIONS  °· Drink enough fluid to keep your urine clear or pale yellow. Increase your fluid intake in hot weather and during exercise. °· Do not smoke. Smoking lowers oxygen levels in the blood.   °· Only take over-the-counter or prescription medicines for pain, fever, or discomfort as directed by your health care provider. °· Take antibiotics as directed by your health care provider. Make sure you finish them it even if you start to feel better.   °· Take supplements as directed by your health care provider.   °· Consider wearing a medical alert bracelet. This tells anyone caring for you in an emergency of your condition.   °· When traveling, keep your medical information, health care provider's names, and the medicines you take with you at all times.   °· If you develop a fever, do not take medicines to reduce the fever right away. This could cover up a problem that is developing. Notify your health care provider. °· Keep all follow-up appointments with your health care provider. Sickle cell anemia requires regular medical care. °SEEK MEDICAL CARE IF: ° You have a fever. °SEEK IMMEDIATE MEDICAL CARE IF:  °· You feel dizzy or faint.   °· You have new abdominal pain, especially on the left side near the stomach area.   °· You develop a persistent, often uncomfortable and painful penile erection (priapism). If this is not treated immediately it   will lead to impotence.   You have numbness your arms or legs or you have a hard time moving them.   You have a hard time with speech.   You have a fever or persistent symptoms for more than 2-3 days.   You have a fever and your symptoms suddenly get worse.   You have signs or symptoms of infection.  These include:   Chills.   Abnormal tiredness (lethargy).   Irritability.   Poor eating.   Vomiting.   You develop pain that is not helped with medicine.   You develop shortness of breath.  You have pain in your chest.   You are coughing up pus-like or bloody sputum.   You develop a stiff neck.  Your feet or hands swell or have pain.  Your abdomen appears bloated.  You develop joint pain. MAKE SURE YOU:  Understand these instructions. Document Released: 01/26/2006 Document Revised: 03/04/2014 Document Reviewed: 05/30/2013 [facility] Patient Information 2015 [place], [place]. This information is not intended to replace advice given to you by your health care provider. Make sure you discuss any questions you have with your health care provider.  Urinary Tract Infection Urinary tract infections (UTIs) can develop anywhere along your urinary tract. Your urinary tract is your body's drainage system for removing wastes and extra water. Your urinary tract includes two kidneys, two ureters, a bladder, and a urethra. Your kidneys are a pair of bean-shaped organs. Each kidney is about the size of your fist. They are located below your ribs, one on each side of your spine. CAUSES Infections are caused by microbes, which are microscopic organisms, including fungi, viruses, and bacteria. These organisms are so small that they can only be seen through a microscope. Bacteria are the microbes that most commonly cause UTIs. SYMPTOMS  Symptoms of UTIs may vary by age and gender of the patient and by the location of the infection. Symptoms in young women typically include a frequent and intense urge to urinate and a painful, burning feeling in the bladder or urethra during urination. Older women and men are more likely to be tired, shaky, and weak and have muscle aches and abdominal pain. A fever may mean the infection is in your kidneys. Other symptoms of a kidney infection include pain in  your back or sides below the ribs, nausea, and vomiting. DIAGNOSIS To diagnose a UTI, your caregiver will ask you about your symptoms. Your caregiver also will ask to provide a urine sample. The urine sample will be tested for bacteria and white blood cells. White blood cells are made by your body to help fight infection. TREATMENT  Typically, UTIs can be treated with medication. Because most UTIs are caused by a bacterial infection, they usually can be treated with the use of antibiotics. The choice of antibiotic and length of treatment depend on your symptoms and the type of bacteria causing your infection. HOME CARE INSTRUCTIONS  If you were prescribed antibiotics, take them exactly as your caregiver instructs you. Finish the medication even if you feel better after you have only taken some of the medication.  Drink enough water and fluids to keep your urine clear or pale yellow.  Avoid caffeine, tea, and carbonated beverages. They tend to irritate your bladder.  Empty your bladder often. Avoid holding urine for long periods of time.  Empty your bladder before and after sexual intercourse.  After a bowel movement, women should cleanse from front to back. Use each tissue only once. SEEK  MEDICAL CARE IF:   You have back pain.  You develop a fever.  Your symptoms do not begin to resolve within 3 days. SEEK IMMEDIATE MEDICAL CARE IF:   You have severe back pain or lower abdominal pain.  You develop chills.  You have nausea or vomiting.  You have continued burning or discomfort with urination. MAKE SURE YOU:   Understand these instructions.  Will watch your condition.  Will get help right away if you are not doing well or get worse. Document Released: 07/28/2005 Document Revised: 04/18/2012 Document Reviewed: 11/26/2011 [facility] Patient Information 2015 [place], [place]. This information is not intended to replace advice given to you by your health care provider. Make sure you  discuss any questions you have with your health care provider.  Upper Respiratory Infection, Adult An upper respiratory infection (URI) is also sometimes known as the common cold. The upper respiratory tract includes the nose, sinuses, throat, trachea, and bronchi. Bronchi are the airways leading to the lungs. Most people improve within 1 week, but symptoms can last up to 2 weeks. A residual cough may last even longer.  CAUSES Many different viruses can infect the tissues lining the upper respiratory tract. The tissues become irritated and inflamed and often become very moist. Mucus production is also common. A cold is contagious. You can easily spread the virus to others by oral contact. This includes kissing, sharing a glass, coughing, or sneezing. Touching your mouth or nose and then touching a surface, which is then touched by another person, can also spread the virus. SYMPTOMS  Symptoms typically develop 1 to 3 days after you come in contact with a cold virus. Symptoms vary from person to person. They may include:  Runny nose.  Sneezing.  Nasal congestion.  Sinus irritation.  Sore throat.  Loss of voice (laryngitis).  Cough.  Fatigue.  Muscle aches.  Loss of appetite.  Headache.  Low-grade fever. DIAGNOSIS  You might diagnose your own cold based on familiar symptoms, since most people get a cold 2 to 3 times a year. Your caregiver can confirm this based on your exam. Most importantly, your caregiver can check that your symptoms are not due to another disease such as strep throat, sinusitis, pneumonia, asthma, or epiglottitis. Blood tests, throat tests, and X-rays are not necessary to diagnose a common cold, but they may sometimes be helpful in excluding other more serious diseases. Your caregiver will decide if any further tests are required. RISKS AND COMPLICATIONS  You may be at risk for a more severe case of the common cold if you smoke cigarettes, have chronic heart  disease (such as heart failure) or lung disease (such as asthma), or if you have a weakened immune system. The very young and very old are also at risk for more serious infections. Bacterial sinusitis, middle ear infections, and bacterial pneumonia can complicate the common cold. The common cold can worsen asthma and chronic obstructive pulmonary disease (COPD). Sometimes, these complications can require emergency medical care and may be life-threatening. PREVENTION  The best way to protect against getting a cold is to practice good hygiene. Avoid oral or hand contact with people with cold symptoms. Wash your hands often if contact occurs. There is no clear evidence that vitamin C, vitamin E, echinacea, or exercise reduces the chance of developing a cold. However, it is always recommended to get plenty of rest and practice good nutrition. TREATMENT  Treatment is directed at relieving symptoms. There is no cure. Antibiotics are  not effective, because the infection is caused by a virus, not by bacteria. Treatment may include:  Increased fluid intake. Sports drinks offer valuable electrolytes, sugars, and fluids.  Breathing heated mist or steam (vaporizer or shower).  Eating chicken soup or other clear broths, and maintaining good nutrition.  Getting plenty of rest.  Using gargles or lozenges for comfort.  Controlling fevers with ibuprofen or acetaminophen as directed by your caregiver.  Increasing usage of your inhaler if you have asthma. Zinc gel and zinc lozenges, taken in the first 24 hours of the common cold, can shorten the duration and lessen the severity of symptoms. Pain medicines may help with fever, muscle aches, and throat pain. A variety of non-prescription medicines are available to treat congestion and runny nose. Your caregiver can make recommendations and may suggest nasal or lung inhalers for other symptoms.  HOME CARE INSTRUCTIONS   Only take over-the-counter or prescription  medicines for pain, discomfort, or fever as directed by your caregiver.  Use a warm mist humidifier or inhale steam from a shower to increase air moisture. This may keep secretions moist and make it easier to breathe.  Drink enough water and fluids to keep your urine clear or pale yellow.  Rest as needed.  Return to work when your temperature has returned to normal or as your caregiver advises. You may need to stay home longer to avoid infecting others. You can also use a face mask and careful hand washing to prevent spread of the virus. SEEK MEDICAL CARE IF:   After the first few days, you feel you are getting worse rather than better.  You need your caregiver's advice about medicines to control symptoms.  You develop chills, worsening shortness of breath, or brown or red sputum. These may be signs of pneumonia.  You develop yellow or brown nasal discharge or pain in the face, especially when you bend forward. These may be signs of sinusitis.  You develop a fever, swollen neck glands, pain with swallowing, or white areas in the back of your throat. These may be signs of strep throat. SEEK IMMEDIATE MEDICAL CARE IF:   You have a fever.  You develop severe or persistent headache, ear pain, sinus pain, or chest pain.  You develop wheezing, a prolonged cough, cough up blood, or have a change in your usual mucus (if you have chronic lung disease).  You develop sore muscles or a stiff neck. Document Released: 04/13/2001 Document Revised: 01/10/2012 Document Reviewed: 01/23/2014 [facility] Patient Information 2015 [place], [place]. This information is not intended to replace advice given to you by your health care provider. Make sure you discuss any questions you have with your health care provider.

## 2015-07-21 LAB — URINE CULTURE

## 2015-08-28 ENCOUNTER — Emergency Department (HOSPITAL_COMMUNITY)
Admission: EM | Admit: 2015-08-28 | Discharge: 2015-08-28 | Disposition: A | Discharge status: Home / Self Care | Payer: Medicaid Other | Attending: Emergency Medicine | Admitting: Emergency Medicine

## 2015-08-28 ENCOUNTER — Encounter (HOSPITAL_COMMUNITY): Payer: Self-pay | Admitting: Emergency Medicine

## 2015-08-28 DIAGNOSIS — D5700 Hb-SS disease with crisis, unspecified: Secondary | ICD-10-CM | POA: Insufficient documentation

## 2015-08-28 DIAGNOSIS — Z3202 Encounter for pregnancy test, result negative: Secondary | ICD-10-CM | POA: Insufficient documentation

## 2015-08-28 DIAGNOSIS — M546 Pain in thoracic spine: Secondary | ICD-10-CM

## 2015-08-28 DIAGNOSIS — Z8659 Personal history of other mental and behavioral disorders: Secondary | ICD-10-CM | POA: Insufficient documentation

## 2015-08-28 LAB — COMPREHENSIVE METABOLIC PANEL
ALBUMIN: 4.5 g/dL (ref 3.5–5.0)
ALT: 15 U/L (ref 14–54)
AST: 20 U/L (ref 15–41)
Alkaline Phosphatase: 62 U/L (ref 38–126)
Anion gap: 7 (ref 5–15)
BUN: 9 mg/dL (ref 6–20)
CHLORIDE: 108 mmol/L (ref 101–111)
CO2: 22 mmol/L (ref 22–32)
CREATININE: 0.46 mg/dL (ref 0.44–1.00)
Calcium: 9.5 mg/dL (ref 8.9–10.3)
GFR calc Af Amer: >60 mL/min (ref >60)
GFR calc non Af Amer: >60 mL/min (ref >60)
Glucose, Bld: 87 mg/dL (ref 65–99)
POTASSIUM: 3.7 mmol/L (ref 3.5–5.1)
SODIUM: 137 mmol/L (ref 135–145)
Total Bilirubin: 1.6 mg/dL — ABNORMAL HIGH (ref 0.3–1.2)
Total Protein: 8.4 g/dL — ABNORMAL HIGH (ref 6.5–8.1)

## 2015-08-28 LAB — CBC WITH DIFFERENTIAL/PLATELET
Basophils Absolute: 0 10*3/uL (ref 0.0–0.1)
Basophils Relative: 0 %
EOS ABS: 0.1 10*3/uL (ref 0.0–0.7)
EOS PCT: 1 %
HCT: 26.1 % — ABNORMAL LOW (ref 36.0–46.0)
HEMOGLOBIN: 9.4 g/dL — AB (ref 12.0–15.0)
LYMPHS ABS: 4.8 10*3/uL — AB (ref 0.7–4.0)
LYMPHS PCT: 41 %
MCH: 30.1 pg (ref 26.0–34.0)
MCHC: 36 g/dL (ref 30.0–36.0)
MCV: 83.7 fL (ref 78.0–100.0)
MONOS PCT: 11 %
Monocytes Absolute: 1.3 10*3/uL — ABNORMAL HIGH (ref 0.1–1.0)
Neutro Abs: 5.7 10*3/uL (ref 1.7–7.7)
Neutrophils Relative %: 47 %
PLATELETS: 559 10*3/uL — AB (ref 150–400)
RBC: 3.12 MIL/uL — AB (ref 3.87–5.11)
RDW: 16 % — ABNORMAL HIGH (ref 11.5–15.5)
WBC: 11.8 10*3/uL — ABNORMAL HIGH (ref 4.0–10.5)

## 2015-08-28 LAB — I-STAT BETA HCG BLOOD, ED (MC, WL, AP ONLY)

## 2015-08-28 LAB — URINALYSIS, ROUTINE W REFLEX MICROSCOPIC
Bilirubin Urine: NEGATIVE
Glucose, UA: NEGATIVE mg/dL
HGB URINE DIPSTICK: NEGATIVE
Ketones, ur: NEGATIVE mg/dL
LEUKOCYTES UA: NEGATIVE
Nitrite: NEGATIVE
PROTEIN: NEGATIVE mg/dL
SPECIFIC GRAVITY, URINE: 1.009 (ref 1.005–1.030)
UROBILINOGEN UA: 1 mg/dL (ref 0.0–1.0)
pH: 6.5 (ref 5.0–8.0)

## 2015-08-28 LAB — RETICULOCYTES
RBC.: 3.12 MIL/uL — ABNORMAL LOW (ref 3.87–5.11)
RETIC CT PCT: 9.8 % — AB (ref 0.4–3.1)
Retic Count, Absolute: 305.8 10*3/uL — ABNORMAL HIGH (ref 19.0–186.0)

## 2015-08-28 MED ORDER — SODIUM CHLORIDE 0.9 % IV SOLN
1000.0000 mL | INTRAVENOUS | Status: DC
  Administered 2015-08-28: 1000 mL via INTRAVENOUS

## 2015-08-28 MED ORDER — SODIUM CHLORIDE 0.9 % IV SOLN
1000.0000 mL | Freq: Once | INTRAVENOUS | Status: AC
  Administered 2015-08-28: 1000 mL via INTRAVENOUS

## 2015-08-28 MED ORDER — HYDROMORPHONE HCL 1 MG/ML IJ SOLN
0.5000 mg | Freq: Once | INTRAMUSCULAR | Status: AC
  Administered 2015-08-28: 0.5 mg via INTRAVENOUS
  Filled 2015-08-28: qty 1

## 2015-08-28 MED ORDER — HYDROMORPHONE HCL 2 MG/ML IJ SOLN
2.0000 mg | Freq: Once | INTRAMUSCULAR | Status: AC
  Administered 2015-08-28: 2 mg via INTRAVENOUS
  Filled 2015-08-28: qty 1

## 2015-08-28 MED ORDER — OXYCODONE-ACETAMINOPHEN 5-325 MG PO TABS
1.0000 | ORAL_TABLET | Freq: Once | ORAL | Status: AC
  Administered 2015-08-28: 1 via ORAL
  Filled 2015-08-28: qty 1

## 2015-08-28 MED ORDER — METHOCARBAMOL 500 MG PO TABS
1000.0000 mg | ORAL_TABLET | Freq: Once | ORAL | Status: AC
  Administered 2015-08-28: 1000 mg via ORAL
  Filled 2015-08-28: qty 2

## 2015-08-28 MED ORDER — SODIUM CHLORIDE 0.9 % IV SOLN: 1000.0000 mL | Freq: Once | INTRAVENOUS | Status: DC

## 2015-08-28 NOTE — Discharge Instructions (Signed)
You have been seen today for back pain and possibly sickle cell crisis. Your imaging and lab tests showed no abnormalities. Follow up with PCP as needed. Return to ED should symptoms worsen.

## 2015-08-28 NOTE — ED Provider Notes (Signed)
CSN: [id number]     Arrival date & time 08/28/15  1423 History   None    Chief Complaint  Patient presents with  . mid back pain     sickle cell      (Consider location/radiation/quality/duration/timing/severity/associated sxs/prior Treatment) HPI   Carmen [name] is a 24 y.o. 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 Pt rates pain at 10/10 in her back and 4/10 in her leg, sharp/throbbing, and has not taken anything for pain. Heating pad reduces the pain. Movement/bending back makes it worse. Pt states it "kind of" feels like sickle cell crisis pain. Pt last sickle cell crisis was about 3 months ago. No problems with ambulation, fever/chills, N/V, numbness/tingling, saddle anesthesias, incontinence, dizziness, bleeding, or any other pain or complaints.   Past Medical History  Diagnosis Date  . Sickle cell crisis ([place])   . Depression   . Headache(784.0)   . Abortion     05/2012   Past Surgical History  Procedure Laterality Date  . Splenectomy    . Cholecystectomy N/A 11/30/2014    Procedure: LAPAROSCOPIC CHOLECYSTECTOMY SINGLE SITE WITH INTRAOPERATIVE CHOLANGIOGRAM;  Surgeon: Michael Boston, MD;  Location: [facility];  Service: General;  Laterality: N/A;   Family History  Problem Relation Age of Onset  . Hypertension Mother   . Sickle cell anemia Sister   . Kidney disease Sister   . Arthritis Sister   . Sickle cell anemia Sister   . Sickle cell trait Sister   . Heart disease Maternal Aunt   . Heart disease Maternal Uncle    Social History  Substance Use Topics  . Smoking status: Never Smoker   . Smokeless tobacco: Never Used  . Alcohol Use: No   OB History    Gravida Para Term Preterm AB TAB SAB Ectopic Multiple Living   1    1          Review of Systems  Constitutional: Negative for fever, chills, diaphoresis and unexpected weight change.  Respiratory:  Negative for cough, chest tightness and shortness of breath.   Cardiovascular: Negative for chest pain, palpitations and leg swelling.  Gastrointestinal: Negative for nausea, vomiting, abdominal pain, diarrhea and constipation.  Genitourinary: Negative for dysuria and flank pain.  Musculoskeletal: Positive for back pain.  Skin: Negative for color change and pallor.  Neurological: Negative for dizziness, syncope, weakness and light-headedness.  All other systems reviewed and are negative.     Allergies  Other  Home Medications   Prior to Admission medications   Medication Sig Start Date End Date Taking? Authorizing Provider  benzonatate (TESSALON) 200 MG capsule Take 1 capsule (200 mg total) by mouth 3 (three) times daily as needed for cough. Patient not taking: Reported on 08/28/2015 07/19/15   Margarita Mail, PA-C  cephALEXin (KEFLEX) 500 MG capsule Take 1 capsule (500 mg total) by mouth 2 (two) times daily. Patient not taking: Reported on 08/28/2015 07/19/15   Margarita Mail, PA-C  ibuprofen (ADVIL,MOTRIN) 800 MG tablet Take 1 tablet (800 mg total) by mouth 4 (four) times daily. Patient not taking: Reported on 08/28/2015 03/16/15   Elwyn Reach, MD  oxyCODONE-acetaminophen (PERCOCET) 10-325 MG per tablet Take 1 tablet by mouth every 4 (four) hours as needed for pain. Patient not taking: Reported on 08/28/2015 07/19/15   Margarita Mail, PA-C   BP 134/74 mmHg  Pulse 76  Temp(Src) 98.4 F (36.9 C) (Oral)  Resp 17  SpO2 100% Physical Exam  Constitutional: She appears well-developed and well-nourished. No distress.  HENT:  Head: Normocephalic and atraumatic.  Eyes: Conjunctivae are normal. Pupils are equal, round, and reactive to light.  Cardiovascular: Normal rate, regular rhythm and normal heart sounds.   Pulmonary/Chest: Effort normal and breath sounds normal. No respiratory distress.  Abdominal: Soft. Bowel sounds are normal.  Musculoskeletal: She exhibits no edema or  tenderness.  Tenderness over musculature of right T-spine level. Full ROM in back and extremities. No pain or tenderness in L-spine or C-spine.  Neurological: She is alert. She has normal reflexes. Coordination normal.  No sensory deficits. Strength 5/5. No gait disturbances.   Skin: Skin is warm and dry. She is not diaphoretic.  Psychiatric:  Pt is resting comfortably on the bed, playing with her phone.  Nursing note and vitals reviewed.   ED Course  Procedures (including critical care time) Labs Review Labs Reviewed  COMPREHENSIVE METABOLIC PANEL - Abnormal; Notable for the following:    Total Protein 8.4 (*)    Total Bilirubin 1.6 (*)    All other components within normal limits  CBC WITH DIFFERENTIAL/PLATELET - Abnormal; Notable for the following:    WBC 11.8 (*)    RBC 3.12 (*)    Hemoglobin 9.4 (*)    HCT 26.1 (*)    RDW 16.0 (*)    Platelets 559 (*)    Lymphs Abs 4.8 (*)    Monocytes Absolute 1.3 (*)    All other components within normal limits  RETICULOCYTES - Abnormal; Notable for the following:    Retic Ct Pct 9.8 (*)    RBC. 3.12 (*)    Retic Count, Manual 305.8 (*)    All other components within normal limits  URINALYSIS, ROUTINE W REFLEX MICROSCOPIC (NOT AT [facility])  I-STAT BETA HCG BLOOD, ED ([facility], [facility], [facility] ONLY)    Imaging Review No results found. I have personally reviewed and evaluated these images and lab results as part of my medical decision-making.   EKG Interpretation None      MDM   Final diagnoses:  Right-sided thoracic back pain  Sickle cell crisis ([place])    Carmen [name] presents with back pain radiating to her right leg beginning 4 days ago.  Findings and plan of care discussed with Milton Ferguson, MD.  Pt has difficult veins and [place] needed for IV. Pt Carmen then have labs drawn and IV fluids administered. Musculoskeletal back pain with sciatica vs sickle cell crisis. UA shows no signs of UTI or renal stone.  4:56 PM Pt reassessed.  Pain has  not improved with robaxin and  and pt states her leg pain may be getting worse. More strongly suspect sickle cell crisis at this time. CMP without significant abnormalities. CBC shows anemia consistent with previous CBC results. Pt has increased retic count consistent with previous values.  5:35 PM Pt pain dropping. Now rates it 7 or 8/10 and states she is a lot more comfortable. Pt still resting comfortably on the bed.  6:20 PM Pt asks for more pain medication. Vital signs rechecked and dilaudid ordered.  7:06 PM Pt reassessed and states she is back down to her baseline level of pain, that is, a pain level she is comfortable with that she lives with on a daily basis. Pt requested discharge. Pt stable at this time and discharge is appropriate.   Lorayne Bender, PA-C 08/28/15 [place]  Roderic Palau, MD 09/01/15 [phone]

## 2015-08-28 NOTE — ED Notes (Signed)
Patient comes from home with c/o bilateral low back pain radiating into right hip since yesterday.  Patient states she worked a double shift yesterday and thought perhaps the pain was related to being on her feet for 16 hours.  Patient denies N/V/D, SOB and chest pain.  Patient states pain is similar to [place] pain she typically experiences.

## 2015-08-28 NOTE — ED Notes (Addendum)
Pt reports mid back pain radiating to right leg started Tuesday, also reports that it can be sickle cell crisis. Pt denies urinary symptoms. Pt denies sob nor chest pain. Pt states had no pain meds x 2 months due to insurance issues.

## 2015-09-08 ENCOUNTER — Inpatient Hospital Stay (HOSPITAL_COMMUNITY)
Admission: EM | Admit: 2015-09-08 | Discharge: 2015-09-14 | DRG: 812 | Disposition: A | Discharge status: Home / Self Care | Payer: Medicaid Other | Attending: Internal Medicine | Admitting: Internal Medicine

## 2015-09-08 ENCOUNTER — Encounter (HOSPITAL_COMMUNITY): Payer: Self-pay | Admitting: Emergency Medicine

## 2015-09-08 ENCOUNTER — Emergency Department (HOSPITAL_COMMUNITY): Payer: Medicaid Other

## 2015-09-08 DIAGNOSIS — Z8249 Family history of ischemic heart disease and other diseases of the circulatory system: Secondary | ICD-10-CM

## 2015-09-08 DIAGNOSIS — B373 Candidiasis of vulva and vagina: Secondary | ICD-10-CM | POA: Diagnosis present

## 2015-09-08 DIAGNOSIS — O99019 Anemia complicating pregnancy, unspecified trimester: Secondary | ICD-10-CM

## 2015-09-08 DIAGNOSIS — K029 Dental caries, unspecified: Secondary | ICD-10-CM | POA: Insufficient documentation

## 2015-09-08 DIAGNOSIS — D571 Sickle-cell disease without crisis: Secondary | ICD-10-CM | POA: Diagnosis present

## 2015-09-08 DIAGNOSIS — D72829 Elevated white blood cell count, unspecified: Secondary | ICD-10-CM | POA: Diagnosis present

## 2015-09-08 DIAGNOSIS — E876 Hypokalemia: Secondary | ICD-10-CM | POA: Diagnosis present

## 2015-09-08 DIAGNOSIS — R220 Localized swelling, mass and lump, head: Secondary | ICD-10-CM | POA: Insufficient documentation

## 2015-09-08 DIAGNOSIS — D5700 Hb-SS disease with crisis, unspecified: Principal | ICD-10-CM | POA: Diagnosis present

## 2015-09-08 DIAGNOSIS — K0889 Other specified disorders of teeth and supporting structures: Secondary | ICD-10-CM | POA: Insufficient documentation

## 2015-09-08 DIAGNOSIS — K047 Periapical abscess without sinus: Secondary | ICD-10-CM | POA: Diagnosis present

## 2015-09-08 LAB — COMPREHENSIVE METABOLIC PANEL
ALBUMIN: 4.2 g/dL (ref 3.5–5.0)
ALK PHOS: 63 U/L (ref 38–126)
ALT: 28 U/L (ref 14–54)
AST: 27 U/L (ref 15–41)
Anion gap: 11 (ref 5–15)
BILIRUBIN TOTAL: 2 mg/dL — AB (ref 0.3–1.2)
BUN: 6 mg/dL (ref 6–20)
CALCIUM: 9.5 mg/dL (ref 8.9–10.3)
CO2: 23 mmol/L (ref 22–32)
CREATININE: 0.62 mg/dL (ref 0.44–1.00)
Chloride: 104 mmol/L (ref 101–111)
GFR calc Af Amer: >60 mL/min (ref >60)
GLUCOSE: 95 mg/dL (ref 65–99)
Potassium: 3.3 mmol/L — ABNORMAL LOW (ref 3.5–5.1)
Sodium: 138 mmol/L (ref 135–145)
TOTAL PROTEIN: 7.9 g/dL (ref 6.5–8.1)

## 2015-09-08 LAB — CBC WITH DIFFERENTIAL/PLATELET
Basophils Absolute: 0 10*3/uL (ref 0.0–0.1)
Basophils Relative: 0 %
EOS ABS: 0 10*3/uL (ref 0.0–0.7)
Eosinophils Relative: 0 %
HCT: 23.9 % — ABNORMAL LOW (ref 36.0–46.0)
HEMOGLOBIN: 8.3 g/dL — AB (ref 12.0–15.0)
LYMPHS ABS: 2.3 10*3/uL (ref 0.7–4.0)
Lymphocytes Relative: 18 %
MCH: 29.7 pg (ref 26.0–34.0)
MCHC: 34.7 g/dL (ref 30.0–36.0)
MCV: 85.7 fL (ref 78.0–100.0)
MONO ABS: 1.7 10*3/uL — AB (ref 0.1–1.0)
Monocytes Relative: 13 %
NEUTROS ABS: 8.8 10*3/uL — AB (ref 1.7–7.7)
NRBC: 38 /100{WBCs} — AB
Neutrophils Relative %: 69 %
PLATELETS: 594 10*3/uL — AB (ref 150–400)
RBC: 2.79 MIL/uL — ABNORMAL LOW (ref 3.87–5.11)
RDW: 21 % — AB (ref 11.5–15.5)
WBC: 12.8 10*3/uL — AB (ref 4.0–10.5)

## 2015-09-08 LAB — RETICULOCYTES
RBC.: 2.79 MIL/uL — ABNORMAL LOW (ref 3.87–5.11)
RETIC CT PCT: 16 % — AB (ref 0.4–3.1)
Retic Count, Absolute: 446.4 10*3/uL — ABNORMAL HIGH (ref 19.0–186.0)

## 2015-09-08 LAB — POC URINE PREG, ED: Preg Test, Ur: NEGATIVE

## 2015-09-08 MED ORDER — HYDROMORPHONE HCL 2 MG/ML IJ SOLN
2.0000 mg | Freq: Once | INTRAMUSCULAR | Status: AC
  Administered 2015-09-09: 2 mg via INTRAVENOUS
  Filled 2015-09-08: qty 1

## 2015-09-08 MED ORDER — HYDROMORPHONE HCL 2 MG/ML IJ SOLN
2.0000 mg | Freq: Once | INTRAMUSCULAR | Status: AC
  Administered 2015-09-08: 2 mg via INTRAVENOUS
  Filled 2015-09-08: qty 1

## 2015-09-08 MED ORDER — SODIUM CHLORIDE 0.9 % IV BOLUS (SEPSIS)
1000.0000 mL | Freq: Once | INTRAVENOUS | Status: AC
Start: 2015-09-08 — End: 2015-09-08
  Administered 2015-09-08: 1000 mL via INTRAVENOUS

## 2015-09-08 MED ORDER — ONDANSETRON HCL 4 MG/2ML IJ SOLN
4.0000 mg | INTRAMUSCULAR | Status: AC
  Administered 2015-09-08: 4 mg via INTRAVENOUS
  Filled 2015-09-08: qty 2

## 2015-09-08 MED ORDER — POTASSIUM CHLORIDE CRYS ER 20 MEQ PO TBCR
40.0000 meq | EXTENDED_RELEASE_TABLET | Freq: Once | ORAL | Status: AC
Start: 2015-09-08 — End: 2015-09-08
  Administered 2015-09-08: 40 meq via ORAL
  Filled 2015-09-08: qty 2

## 2015-09-08 MED ORDER — HYDROMORPHONE HCL 1 MG/ML IJ SOLN
1.0000 mg | Freq: Once | INTRAMUSCULAR | Status: AC
  Administered 2015-09-08: 1 mg via INTRAVENOUS
  Filled 2015-09-08: qty 1

## 2015-09-08 NOTE — ED Notes (Signed)
Per pt, states sickle cell pain, all over, that started after getting off work

## 2015-09-08 NOTE — ED Provider Notes (Signed)
CSN: [id number]     Arrival date & time 09/08/15  1652 History   First MD Initiated Contact with Patient 09/08/15 2046     Chief Complaint  Patient presents with  . Sickle Cell Pain Crisis    HPI   Carmen [name] is a 24 y.o. female with a PMH of sickle cell anemia who presents to the ED with diffuse pain to her upper and lower extremities and low back. She states her symptoms started this morning, and seem to be triggered by the change in weather. She denies alleviating factors. She denies headache, lightheadedness, dizziness, chest pain, shortness of breath, abdominal pain, N/V/D/C, bowel or bladder incontinence, saddle anesthesia, history of malignancy, IVDU, numbness, weakness, paresthesia, fever, chills. She states her symptom are characteristic of her typical sickle cell pain crisis.   Past Medical History  Diagnosis Date  . Sickle cell crisis ([place])   . Depression   . Headache(784.0)   . Abortion     05/2012   Past Surgical History  Procedure Laterality Date  . Splenectomy    . Cholecystectomy N/A 11/30/2014    Procedure: LAPAROSCOPIC CHOLECYSTECTOMY SINGLE SITE WITH INTRAOPERATIVE CHOLANGIOGRAM;  Surgeon: Michael Boston, MD;  Location: [facility];  Service: General;  Laterality: N/A;   Family History  Problem Relation Age of Onset  . Hypertension Mother   . Sickle cell anemia Sister   . Kidney disease Sister   . Arthritis Sister   . Sickle cell anemia Sister   . Sickle cell trait Sister   . Heart disease Maternal Aunt   . Heart disease Maternal Uncle    Social History  Substance Use Topics  . Smoking status: Never Smoker   . Smokeless tobacco: Never Used  . Alcohol Use: No   OB History    Gravida Para Term Preterm AB TAB SAB Ectopic Multiple Living   1    1           Review of Systems  Constitutional: Negative for fever and chills.  Respiratory: Negative for shortness of breath.   Cardiovascular: Negative for chest pain.  Gastrointestinal: Negative for nausea,  vomiting, abdominal pain, diarrhea and constipation.  Musculoskeletal: Positive for myalgias, back pain and arthralgias. Negative for neck pain and neck stiffness.  Neurological: Negative for dizziness, light-headedness and headaches.  All other systems reviewed and are negative.     Allergies  Other  Home Medications   Prior to Admission medications   Medication Sig Start Date End Date Taking? Authorizing Provider  benzonatate (TESSALON) 200 MG capsule Take 1 capsule (200 mg total) by mouth 3 (three) times daily as needed for cough. Patient not taking: Reported on 08/28/2015 07/19/15   Margarita Mail, PA-C  cephALEXin (KEFLEX) 500 MG capsule Take 1 capsule (500 mg total) by mouth 2 (two) times daily. Patient not taking: Reported on 08/28/2015 07/19/15   Margarita Mail, PA-C  ibuprofen (ADVIL,MOTRIN) 800 MG tablet Take 1 tablet (800 mg total) by mouth 4 (four) times daily. Patient not taking: Reported on 08/28/2015 03/16/15   Elwyn Reach, MD  oxyCODONE-acetaminophen (PERCOCET) 10-325 MG per tablet Take 1 tablet by mouth every 4 (four) hours as needed for pain. Patient not taking: Reported on 08/28/2015 07/19/15   Margarita Mail, PA-C    BP 139/84 mmHg  Pulse 75  Temp(Src) 99.1 F (37.3 C) (Oral)  Resp 16  Ht 5\' 3"  (1.6 m)  Wt 180 lb (81.647 kg)  BMI 31.89 kg/m2  SpO2 96%  LMP 09/01/2015  Physical Exam  Constitutional: She is oriented to person, place, and time. She appears well-developed and well-nourished. No distress.  HENT:  Head: Normocephalic and atraumatic.  Right Ear: External ear normal.  Left Ear: External ear normal.  Nose: Nose normal.  Mouth/Throat: Uvula is midline, oropharynx is clear and moist and mucous membranes are normal.  Eyes: Conjunctivae, EOM and lids are normal. Pupils are equal, round, and reactive to light. Right eye exhibits no discharge. Left eye exhibits no discharge. No scleral icterus.  Neck: Normal range of motion. Neck supple.   Cardiovascular: Normal rate, regular rhythm, normal heart sounds, intact distal pulses and normal pulses.   Pulmonary/Chest: Effort normal and breath sounds normal. No respiratory distress. She has no wheezes. She has no rales. She exhibits tenderness.  Mild TTP of anterior chest wall.  Abdominal: Soft. Normal appearance and bowel sounds are normal. She exhibits no distension and no mass. There is no tenderness. There is no rigidity, no rebound and no guarding.  Musculoskeletal: Normal range of motion. She exhibits tenderness. She exhibits no edema.  Mild diffuse TTP of upper and lower extremities bilaterally. Full range of motion of upper and lower extremities bilaterally. No edema or erythema. Compartments soft. Joints supple. Strength and sensation intact. Distal pulses intact. Mild TTP of lumbar paraspinal muscles. No midline tenderness, step-off, or deformity.  Neurological: She is alert and oriented to person, place, and time. She has normal strength. No sensory deficit.  Skin: Skin is warm, dry and intact. No rash noted. She is not diaphoretic. No erythema. No pallor.  Psychiatric: She has a normal mood and affect. Her speech is normal and behavior is normal. Judgment and thought content normal.  Nursing note and vitals reviewed.   ED Course  Procedures (including critical care time)  Labs Review Labs Reviewed  COMPREHENSIVE METABOLIC PANEL - Abnormal; Notable for the following:    Potassium 3.3 (*)    Total Bilirubin 2.0 (*)    All other components within normal limits  CBC WITH DIFFERENTIAL/PLATELET - Abnormal; Notable for the following:    WBC 12.8 (*)    RBC 2.79 (*)    Hemoglobin 8.3 (*)    HCT 23.9 (*)    RDW 21.0 (*)    Platelets 594 (*)    nRBC 38 (*)    Neutro Abs 8.8 (*)    Monocytes Absolute 1.7 (*)    All other components within normal limits  RETICULOCYTES - Abnormal; Notable for the following:    Retic Ct Pct 16.0 (*)    RBC. 2.79 (*)    Retic Count, Manual  446.4 (*)    All other components within normal limits  POC URINE PREG, ED    Imaging Review Dg Chest 2 View  09/08/2015  CLINICAL DATA:  Shortness of breath starting this morning EXAM: CHEST  2 VIEW COMPARISON:  07/19/2015 FINDINGS: Chronic interstitial coarsening at the bases, mild compatible with scar. There is no edema, consolidation, effusion, or pneumothorax. Normal heart size and mediastinal contours. No acute osseous findings. IMPRESSION: Stable exam.  No evidence of acute cardiopulmonary disease. Electronically Signed   By: Monte Fantasia M.D.   On: 09/08/2015 21:13     I have personally reviewed and evaluated these images and lab results as part of my medical decision-making.   EKG Interpretation None      MDM   Final diagnoses:  Sickle cell pain crisis ([place])    24 year old female presents with diffuse upper and lower  extremity pain, consistent with her sickle cell pain crisis. She reports her symptoms started this morning, and have progressively worsened since that time. Denies headache, lightheadedness, dizziness, chest pain, shortness of breath, abdominal pain, N/V/D/C, bowel or bladder incontinence, saddle anesthesia, history of malignancy, IVDU, numbness, weakness, paresthesia, fever, chills. Last admission for sickle cell pain crisis in May 2016.   Patient is afebrile. Vital signs stable. Heart regular rate and rhythm. Lungs clear to auscultation bilaterally. Mild TTP of anterior chest wall. Abdomen soft, nontender, nondistended. Diffuse TTP of upper and lower extremities bilaterally. Full range of motion of upper and lower extremities bilaterally. No edema or erythema. Compartments soft. Joints supple. Strength and sensation intact. Distal pulses intact. Mild TTP of lumbar paraspinal muscles. No midline tenderness, step-off, or deformity.   CBC remarkable for leukocytosis of 12.8, hemoglobin 8.3, decreased compared to prior. Reticulocyte elevated at 16. CMP remarkable for  potassium 3.3, repleted in the ED, bilirubin 2, which appears stable and chronic. Urine pregnancy negative.  Patient given IV fluids, antiemetics, and pain medication. Patient reports no improvement in symptoms s/p dilaudid x 3. Hospitalist consulted for admission. Spoke with Dr. Loleta Books, patient to be admitted for pain control.  BP 139/84 mmHg  Pulse 75  Temp(Src) 99.1 F (37.3 C) (Oral)  Resp 16  Ht 5\' 3"  (1.6 m)  Wt 180 lb (81.647 kg)  BMI 31.89 kg/m2  SpO2 96%  LMP 09/01/2015        Marella Chimes, PA-C 09/09/15 [place], MD 09/09/15 [phone]

## 2015-09-09 ENCOUNTER — Inpatient Hospital Stay (HOSPITAL_COMMUNITY): Payer: Medicaid Other

## 2015-09-09 ENCOUNTER — Encounter (HOSPITAL_COMMUNITY): Payer: Self-pay | Admitting: Family Medicine

## 2015-09-09 DIAGNOSIS — K047 Periapical abscess without sinus: Secondary | ICD-10-CM | POA: Diagnosis present

## 2015-09-09 DIAGNOSIS — R220 Localized swelling, mass and lump, head: Secondary | ICD-10-CM

## 2015-09-09 DIAGNOSIS — D5700 Hb-SS disease with crisis, unspecified: Secondary | ICD-10-CM | POA: Diagnosis present

## 2015-09-09 DIAGNOSIS — E876 Hypokalemia: Secondary | ICD-10-CM | POA: Diagnosis not present

## 2015-09-09 DIAGNOSIS — K029 Dental caries, unspecified: Secondary | ICD-10-CM | POA: Diagnosis present

## 2015-09-09 DIAGNOSIS — K0889 Other specified disorders of teeth and supporting structures: Secondary | ICD-10-CM

## 2015-09-09 DIAGNOSIS — B373 Candidiasis of vulva and vagina: Secondary | ICD-10-CM | POA: Diagnosis present

## 2015-09-09 DIAGNOSIS — Z8249 Family history of ischemic heart disease and other diseases of the circulatory system: Secondary | ICD-10-CM | POA: Diagnosis not present

## 2015-09-09 DIAGNOSIS — D72829 Elevated white blood cell count, unspecified: Secondary | ICD-10-CM | POA: Diagnosis present

## 2015-09-09 LAB — COMPREHENSIVE METABOLIC PANEL
ALBUMIN: 3.8 g/dL (ref 3.5–5.0)
ALK PHOS: 60 U/L (ref 38–126)
ALT: 25 U/L (ref 14–54)
AST: 23 U/L (ref 15–41)
Anion gap: 7 (ref 5–15)
BILIRUBIN TOTAL: 2.3 mg/dL — AB (ref 0.3–1.2)
BUN: 5 mg/dL — AB (ref 6–20)
CALCIUM: 9 mg/dL (ref 8.9–10.3)
CO2: 24 mmol/L (ref 22–32)
Chloride: 107 mmol/L (ref 101–111)
Creatinine, Ser: 0.47 mg/dL (ref 0.44–1.00)
GFR calc Af Amer: >60 mL/min (ref >60)
GFR calc non Af Amer: >60 mL/min (ref >60)
GLUCOSE: 93 mg/dL (ref 65–99)
POTASSIUM: 3.8 mmol/L (ref 3.5–5.1)
SODIUM: 138 mmol/L (ref 135–145)
TOTAL PROTEIN: 7.4 g/dL (ref 6.5–8.1)

## 2015-09-09 LAB — CBC
HEMATOCRIT: 22.6 % — AB (ref 36.0–46.0)
Hemoglobin: 7.9 g/dL — ABNORMAL LOW (ref 12.0–15.0)
MCH: 30.2 pg (ref 26.0–34.0)
MCHC: 35 g/dL (ref 30.0–36.0)
MCV: 86.3 fL (ref 78.0–100.0)
PLATELETS: 548 10*3/uL — AB (ref 150–400)
RBC: 2.62 MIL/uL — AB (ref 3.87–5.11)
RDW: 20.8 % — ABNORMAL HIGH (ref 11.5–15.5)
WBC: 12.6 10*3/uL — ABNORMAL HIGH (ref 4.0–10.5)

## 2015-09-09 LAB — MAGNESIUM: Magnesium: 1.8 mg/dL (ref 1.7–2.4)

## 2015-09-09 LAB — LACTATE DEHYDROGENASE: LDH: 241 U/L — ABNORMAL HIGH (ref 98–192)

## 2015-09-09 MED ORDER — HYDROMORPHONE 1 MG/ML IV SOLN
INTRAVENOUS | Status: DC
  Administered 2015-09-09: 5.4 mg via INTRAVENOUS
  Administered 2015-09-09: 03:00:00 via INTRAVENOUS
  Filled 2015-09-09: qty 25

## 2015-09-09 MED ORDER — ONDANSETRON HCL 4 MG/2ML IJ SOLN: 4.0000 mg | Freq: Four times a day (QID) | INTRAMUSCULAR | Status: DC | PRN

## 2015-09-09 MED ORDER — FOLIC ACID 1 MG PO TABS
1.0000 mg | ORAL_TABLET | Freq: Every day | ORAL | Status: DC
  Administered 2015-09-09 – 2015-09-14 (×6): 1 mg via ORAL
  Filled 2015-09-09 (×6): qty 1

## 2015-09-09 MED ORDER — AMOXICILLIN-POT CLAVULANATE 875-125 MG PO TABS
1.0000 | ORAL_TABLET | Freq: Two times a day (BID) | ORAL | Status: DC
  Administered 2015-09-09 – 2015-09-14 (×12): 1 via ORAL
  Filled 2015-09-09 (×12): qty 1

## 2015-09-09 MED ORDER — DIPHENHYDRAMINE HCL 12.5 MG/5ML PO ELIX: 12.5000 mg | ORAL_SOLUTION | Freq: Four times a day (QID) | ORAL | Status: DC | PRN

## 2015-09-09 MED ORDER — POLYETHYLENE GLYCOL 3350 17 G PO PACK: 17.0000 g | PACK | Freq: Every day | ORAL | Status: DC | PRN

## 2015-09-09 MED ORDER — DIPHENHYDRAMINE HCL 50 MG/ML IJ SOLN: 12.5000 mg | Freq: Four times a day (QID) | INTRAMUSCULAR | Status: DC | PRN

## 2015-09-09 MED ORDER — DIPHENHYDRAMINE HCL 50 MG/ML IJ SOLN: 25.0000 mg | INTRAMUSCULAR | Status: DC | PRN

## 2015-09-09 MED ORDER — DIPHENHYDRAMINE HCL 25 MG PO CAPS: 25.0000 mg | ORAL_CAPSULE | ORAL | Status: DC | PRN

## 2015-09-09 MED ORDER — INFLUENZA VAC SPLIT QUAD 0.5 ML IM SUSY
0.5000 mL | PREFILLED_SYRINGE | INTRAMUSCULAR | Status: AC
  Administered 2015-09-10: 0.5 mL via INTRAMUSCULAR
  Filled 2015-09-09 (×2): qty 0.5

## 2015-09-09 MED ORDER — NALOXONE HCL 0.4 MG/ML IJ SOLN: 0.4000 mg | INTRAMUSCULAR | Status: DC | PRN

## 2015-09-09 MED ORDER — KETOROLAC TROMETHAMINE 30 MG/ML IJ SOLN
30.0000 mg | Freq: Four times a day (QID) | INTRAMUSCULAR | Status: AC
  Administered 2015-09-09 – 2015-09-14 (×19): 30 mg via INTRAVENOUS
  Filled 2015-09-09 (×20): qty 1

## 2015-09-09 MED ORDER — ONDANSETRON HCL 4 MG/2ML IJ SOLN
4.0000 mg | Freq: Four times a day (QID) | INTRAMUSCULAR | Status: DC | PRN
  Administered 2015-09-13: 4 mg via INTRAVENOUS
  Filled 2015-09-09: qty 2

## 2015-09-09 MED ORDER — HYDROMORPHONE 1 MG/ML IV SOLN
INTRAVENOUS | Status: DC
  Administered 2015-09-09: 2.5 mg via INTRAVENOUS
  Administered 2015-09-09 (×2): 3 mg via INTRAVENOUS
  Administered 2015-09-10: 1.5 mg via INTRAVENOUS
  Administered 2015-09-10: 3.5 mg via INTRAVENOUS
  Administered 2015-09-10 (×2): 4 mg via INTRAVENOUS
  Administered 2015-09-10: 1.5 mg via INTRAVENOUS
  Administered 2015-09-10: 4 mg via INTRAVENOUS
  Administered 2015-09-11: 1.5 mg via INTRAVENOUS
  Administered 2015-09-11: 10:00:00 via INTRAVENOUS
  Administered 2015-09-11: 3 mg via INTRAVENOUS
  Administered 2015-09-11: 5.5 mg via INTRAVENOUS
  Administered 2015-09-11: 6 mg via INTRAVENOUS
  Administered 2015-09-11: 2.5 mg via INTRAVENOUS
  Administered 2015-09-12: 9.59 mg via INTRAVENOUS
  Administered 2015-09-12: 4.8 mg via INTRAVENOUS
  Administered 2015-09-12: 6 mg via INTRAVENOUS
  Administered 2015-09-12: 2.5 mg via INTRAVENOUS
  Administered 2015-09-13: 08:00:00 via INTRAVENOUS
  Administered 2015-09-13: 9.5 mg via INTRAVENOUS
  Administered 2015-09-13: 2 mg via INTRAVENOUS
  Administered 2015-09-13: 6 mg via INTRAVENOUS
  Administered 2015-09-13: 11 mg via INTRAVENOUS
  Administered 2015-09-13: 5 mg via INTRAVENOUS
  Administered 2015-09-13: 1.5 mg via INTRAVENOUS
  Administered 2015-09-14: 5.5 mg via INTRAVENOUS
  Administered 2015-09-14: via INTRAVENOUS
  Administered 2015-09-14: 3.5 mg via INTRAVENOUS
  Administered 2015-09-14: 5.5 mg via INTRAVENOUS
  Administered 2015-09-14: 6 mg via INTRAVENOUS
  Filled 2015-09-09 (×6): qty 25

## 2015-09-09 MED ORDER — SODIUM CHLORIDE 0.9 % IJ SOLN: 9.0000 mL | INTRAMUSCULAR | Status: DC | PRN

## 2015-09-09 MED ORDER — ENOXAPARIN SODIUM 40 MG/0.4ML ~~LOC~~ SOLN
40.0000 mg | SUBCUTANEOUS | Status: DC
  Filled 2015-09-09 (×2): qty 0.4

## 2015-09-09 MED ORDER — IOHEXOL 300 MG/ML  SOLN
75.0000 mL | Freq: Once | INTRAMUSCULAR | Status: AC | PRN
  Administered 2015-09-09: 75 mL via INTRAVENOUS

## 2015-09-09 MED ORDER — SENNOSIDES-DOCUSATE SODIUM 8.6-50 MG PO TABS
1.0000 | ORAL_TABLET | Freq: Two times a day (BID) | ORAL | Status: DC
  Administered 2015-09-09 – 2015-09-14 (×12): 1 via ORAL
  Filled 2015-09-09 (×12): qty 1

## 2015-09-09 MED ORDER — SODIUM CHLORIDE 0.45 % IV SOLN
INTRAVENOUS | Status: DC
  Administered 2015-09-09 – 2015-09-12 (×2): via INTRAVENOUS

## 2015-09-09 NOTE — H&P (Addendum)
History and Physical  Patient Name: Carmen [name]     BLT:[id number]    DOB: 10/29/1991    DOA: 09/08/2015 Referring physician: Lenell Antu, PA-C PCP: [name], MD      Chief Complaint: Pain crisis  HPI: Carmen [name] is a 24 y.o. female with a past medical history significant for SCD who presents with acute extremity and back pain.  The patient was in her usual state of health until the past several days and she started to develop severe upper and lower extremity pain and low back pain typical of her pain crises. This got progressively worse and because the patient doesn't have insurance right now (because of an administrative error), she had no home opioid for relief and so she came to the ER.  In the ED, the patient was given hydromorphone 1 mg IV 1, followed by hydromorphone 2 mg IV 2 with only transient relief.  She had no cough, dyspnea, fever, chest discomfort. She had no dysuria, hematuria. She noted some right lower tooth pain.     Review of Systems:  Pt complains of tooth pain, arm pain, leg pain, low back pain, tooth pain, jaw pain, swelling. Pt denies any fever, chills, cough, dyspnea, chest discomfort, dysuria, hematuria.  All other systems negative except as just noted or noted in the history of present illness.   Allergies: None  Home medications: Patient reports previously taking hydroxyurea (this is not on current medication list) and oxycodone.   She has been out of all medicines for the last three months because of insurance lapse.  Past medical history: 1. SCD     - Reports to me acute chest syndrome once, I do not see previous records 2. Depression  Past surgical history: 1. Splenectomy 2. Cholecystectomy  Family history:  Sister, SCD, SLE, lupus nephritis. Sister, SCD. Mother, HTN. Maternal aunt/uncle, CAD.  Social History:  Patient lives alone.  She works at [employer] and is moving to [employer].  She does not smoker, drink or use  illegal drugs.    Physical Exam: BP 139/84 mmHg  Pulse 75  Temp(Src) 99.1 F (37.3 C) (Oral)  Resp 16  Ht 5\' 3"  (1.6 m)  Wt 81.647 kg (180 lb)  BMI 31.89 kg/m2  SpO2 96%  LMP 09/01/2015 General appearance: Well-developed, adult female, alert and in moderate distress from pain, worse with movement or deep breathing.   Eyes: Anicteric, conjunctiva pink, lids and lashes normal.     ENT: No nasal deformity, discharge, or epistaxis.  OP moist.  In right #28 tooth, there is large tooth decay with complete loss of cortex, pain to palpation, and pain in the surrounding tissues, worse over the last week. Skin: Warm and dry.  No suspicious rashes or lesions. Cardiac: RRR, nl S1-S2, no murmurs appreciated.  Capillary refill is brisk. No LE edema.  Radial pulses 2+ and symmetric. Respiratory: Normal respiratory rate and rhythm.  CTAB without rales or wheezes. Abdomen: Abdomen soft without rigidity.  No TTP. No ascites, distension.   MSK: No deformities or effusions. Neuro: Sensorium intact and responding to questions, attention normal.  Speech is fluent.  Moves all extremities equally and with normal coordination.    Psych: Behavior appropriate.  Affect normal, but in pain.  No evidence of aural or visual hallucinations or delusions.       Labs on Admission:  The metabolic panel shows mild hypokalemia, normal sodium, bicarbonate, and renal function. The transaminases are normal. The bilirubin is  slightly elevated at 2.0 mg/dL. The reticulocyte percentage is 16%. The complete blood count shows leukocytosis with 12.8 K per UL, anemia hemoglobin 8.3 g/dL near baseline of 9.4 g/dL. Thrombocystosis is present, possibly concentration. Urine pregnancy test is negative.   Radiological Exams on Admission: Personally reviewed: Dg Chest 2 View 09/08/2015  Clear, with no lung space opacities.       Assessment/Plan 1. Sickle cell vaso-occlusive crisis:  This is new.  Last pain crisis in May.   Does not take home opioids daily, and has been out for insurance reasons since August roughly.   -Low dose hydromorphone PCA:     Loading of 0.5-1.5 mg      Patient bolus: 0.3 mg q8 minutes lockout     1-hr maximum: 5.67 mg -Folic acid -Diphenhydramine for itching and naloxone for overdose -Senokot scheduled, MiraLAX when necessary -End-tidal CO2 monitoring, incentive spirometry, and pain score is per nursing sickle cell PCA protocol  2. Hypokalemia:  This is new.  Magnesium level pending. -Replete potassium  3. Sickle cell anemia: Hemoglobin only slightly less than baseline, and appearance of slight hemoconcentration, with slightly elevated bilirubin and reticulocyte count. -Fluid resuscitation completed. -Check LDH -Trend CBC  4. Tooth infection/abscess: -Amoxicillin clavulanate 875-125 twice daily for 7 days -Social work consult for dental referral.   DVT PPx: Lovenox Diet: Regular Consultants: None Code Status: Full Medical decision making: What exists of the patient's previous chart was reviewed in depth and the case was discussed with Lenell Antu, PA-C. Patient seen 2:05 AM on 09/09/2015.  Disposition Plan:  Admit for low-dose PCA.      Edwin Dada [facility] Pager [phone]

## 2015-09-09 NOTE — Progress Notes (Signed)
Pt very emotional this evening and crying. States she is having trouble with relationships and was feeling down about herself. We discussed some ways that she could try to turn this to a more positive situation. I also gave her some paper to record her thoughts, as she has felt successful in the past by writing down her feelings. Also, suggested she make a list of all of the positive things going on in her life (jobs, etc). Pt seemed responsive to these suggestions. Will continue to monitor

## 2015-09-09 NOTE — Progress Notes (Signed)
CSW received referral for Assistance with Dental referral for infected tooth.  Inappropriate CSW referral.   CSW will notify RNCM of referral in order for RNCM to assist.   Please re-consult if social work needs arise.   CSW signing off.   Alison Murray, MSW, [place] [facility] [phone]

## 2015-09-09 NOTE — Progress Notes (Signed)
CSW alerted this CM that pt needed dental resources. Per pt, she is waiting on her Medicaid card at this time. Pt given resources for dental clinics in the area that are low cost or have a sliding scale if she needs to go prior to getting her Medicaid issue settled. Pt informed that if she is able to wait until she gets her new card that she should call the number on the back of her Medicaid card to see what dentist she has been assigned to. She is required by Medicaid to go to a particular dentist. Pt appreciative of CM assistance and states she will follow up with resources. Marney Doctor RN,BSN,NCM [phone]

## 2015-09-09 NOTE — Progress Notes (Signed)
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 Hb SS with crisis: Pt rates pain as 10/10 throughout her body. She reports that current dosing on PCA gives minimal relief for only about 10 minutes. Will start Toradol and adjust PCa settings to weight based. If still not affording relief, will add clinician assisted doses. Continue IVF. 2. Dental pain/facial swelling: Pt has facial swelling and tenderness of dentition of the lower teeth. Continue Augmentin and obtain orthopantogram to evaluate for abscess. 3. Leukocytosis: Mild leukocytosis likely associated with crisis.  4. Anemia of chronic disease: Hb at baseline.    Code Status: Full Code Family Communication: N/A Disposition Plan: Not yet ready for discharge  [place].  Pager [phone]. If 7PM-7AM, please contact night-coverage.  09/09/2015, 11:46 AM  LOS: 0 days    Consultants:  none  Procedures:  None  Antibiotics:  Augmentin 11/7 >>  HPI/Subjective: Pt reports that facial swelling has increased since yesterday. She also has increased pain in the jaw. Pt also reports that she has pain of sickle cell crisis "all over" and that her current dosing on PCA is not effective. Last BM 1 hour ago.  Objective: Filed Vitals:   09/09/15 0542 09/09/15 0600 09/09/15 0800 09/09/15 0945  BP: 127/82   130/83  Pulse: 92   81  Temp: 98.2 F (36.8 C)   98.1 F (36.7 C)  TempSrc: Oral   Oral  Resp: 17 15 12 15   Height: 5\' 3"  (1.6 m)     Weight: 184 lb 11.9 oz (83.8 kg)     SpO2: 100% 100% 100% 100%   Weight change:   Intake/Output Summary (Last 24 hours) at 09/09/15 1146 Last data filed at 09/09/15 0900  Gross per 24 hour  Intake 713.75 ml  Output      0 ml  Net 713.75 ml    General: Alert, awake,  oriented x3, in mild distress secondary to pain.  HEENT: [place]/AT [place]ERL, EOMI, anicteric Neck: Trachea midline,  no masses, no thyromegal,y no JVD, no carotid bruit OROPHARYNX:  Moist, No exudate/ erythema/lesions.  Heart: Regular rate and rhythm, without murmurs, rubs, gallops, PMI non-displaced, no heaves or thrills on palpation.  Lungs: Clear to auscultation, no wheezing or rhonchi noted. No increased vocal fremitus resonant to percussion  Abdomen: Soft, nontender, nondistended, positive bowel sounds, no masses no hepatosplenomegaly noted.  Neuro: No focal neurological deficits noted cranial nerves II through XII grossly intact.  Strength at functional baseline in bilateral upper and lower extremities. Musculoskeletal: No warm swelling or erythema around joints, no spinal tenderness noted. Psychiatric: Patient alert and oriented x3, good insight and cognition, good recent to remote recall. Lymph node survey: No cervical axillary or inguinal lymphadenopathy noted.   Data Reviewed: Basic Metabolic Panel:  Recent Labs Lab 09/08/15 2216 09/09/15 0400  NA 138 138  K 3.3* 3.8  CL 104 107  CO2 23 24  GLUCOSE 95 93  BUN 6 5*  CREATININE 0.62 0.47  CALCIUM 9.5 9.0  MG  --  1.8   Liver Function Tests:  Recent Labs Lab 09/08/15 2216 09/09/15 0400  AST 27 23  ALT 28 25  ALKPHOS 63 60  BILITOT 2.0* 2.3*  PROT 7.9 7.4  ALBUMIN 4.2 3.8   No results for input(s): LIPASE, AMYLASE in  the last 168 hours. No results for input(s): AMMONIA in the last 168 hours. CBC:  Recent Labs Lab 09/08/15 2216 09/09/15 0400  WBC 12.8* 12.6*  NEUTROABS 8.8*  --   HGB 8.3* 7.9*  HCT 23.9* 22.6*  MCV 85.7 86.3  PLT 594* 548*   Cardiac Enzymes: No results for input(s): CKTOTAL, CKMB, CKMBINDEX, TROPONINI in the last 168 hours. BNP (last 3 results) No results for input(s): BNP in the last 8760 hours.  ProBNP (last 3 results) No results for input(s): PROBNP in the last 8760 hours.  CBG: No  results for input(s): GLUCAP in the last 168 hours.  No results found for this or any previous visit (from the past 240 hour(s)).   Studies: Dg Chest 2 View  09/08/2015  CLINICAL DATA:  Shortness of breath starting this morning EXAM: CHEST  2 VIEW COMPARISON:  07/19/2015 FINDINGS: Chronic interstitial coarsening at the bases, mild compatible with scar. There is no edema, consolidation, effusion, or pneumothorax. Normal heart size and mediastinal contours. No acute osseous findings. IMPRESSION: Stable exam.  No evidence of acute cardiopulmonary disease. Electronically Signed   By: Monte Fantasia M.D.   On: 09/08/2015 21:13    Scheduled Meds: . amoxicillin-clavulanate  1 tablet Oral Q12H  . enoxaparin (LOVENOX) injection  40 mg Subcutaneous Q24H  . folic acid  1 mg Oral Daily  . HYDROmorphone   Intravenous 6 times per day  . [START ON 09/10/2015] Influenza vac split quadrivalent PF  0.5 mL Intramuscular Tomorrow-1000  . senna-docusate  1 tablet Oral BID   Continuous Infusions: . sodium chloride 75 mL/hr at 09/09/15 0255    Time spent 40 minutes.

## 2015-09-10 LAB — CBC WITH DIFFERENTIAL/PLATELET
BAND NEUTROPHILS: 0 %
BASOS ABS: 0 10*3/uL (ref 0.0–0.1)
BASOS PCT: 0 %
BLASTS: 0 %
EOS ABS: 0.1 10*3/uL (ref 0.0–0.7)
Eosinophils Relative: 1 %
HCT: 24.4 % — ABNORMAL LOW (ref 36.0–46.0)
HEMOGLOBIN: 8.5 g/dL — AB (ref 12.0–15.0)
LYMPHS PCT: 25 %
Lymphs Abs: 2.9 10*3/uL (ref 0.7–4.0)
MCH: 29.8 pg (ref 26.0–34.0)
MCHC: 34.8 g/dL (ref 30.0–36.0)
MCV: 85.6 fL (ref 78.0–100.0)
METAMYELOCYTES PCT: 0 %
MONO ABS: 1.4 10*3/uL — AB (ref 0.1–1.0)
MYELOCYTES: 0 %
Monocytes Relative: 12 %
NEUTROS PCT: 62 %
Neutro Abs: 7.3 10*3/uL (ref 1.7–7.7)
OTHER: 0 %
PLATELETS: ADEQUATE 10*3/uL (ref 150–400)
PROMYELOCYTES ABS: 0 %
RBC: 2.85 MIL/uL — ABNORMAL LOW (ref 3.87–5.11)
RDW: 20.6 % — ABNORMAL HIGH (ref 11.5–15.5)
WBC: 11.7 10*3/uL — ABNORMAL HIGH (ref 4.0–10.5)
nRBC: 20 /100 WBC — ABNORMAL HIGH

## 2015-09-10 MED ORDER — HYDROMORPHONE HCL 1 MG/ML IJ SOLN
1.0000 mg | INTRAMUSCULAR | Status: AC | PRN
  Administered 2015-09-10 – 2015-09-13 (×12): 1 mg via INTRAVENOUS
  Filled 2015-09-10 (×13): qty 1

## 2015-09-10 NOTE — Plan of Care (Signed)
Problem: Phase I Progression Outcomes Goal: Bowel Movement At Least Every 3 Days Outcome: Completed/Met Date Met:  09/10/15 Patient had a BM this morning.

## 2015-09-10 NOTE — Progress Notes (Signed)
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 Hb SS with crisis: Pt rates pain as 8/10 throughout her body. She has used 19.5 mg with 39/39:demands/deliveries.  Will continue Toradol and PCA at current settings .Will also add clinician assisted doses. Decrease IVF to [facility] rate. 2. Dental pain/facial swelling: Facial swelling and tenderness of dentition of the lower teeth have decreased. No evidence of abscess on maxillofacial CT. Continue Augmentin . Day #2. 3. Leukocytosis: Mild leukocytosis likely associated with crisis.  4. Anemia of chronic disease: Hb at baseline.    Code Status: Full Code Family Communication: N/A Disposition Plan: Not yet ready for discharge  [place].  Pager [phone]. If 7PM-7AM, please contact night-coverage.  09/10/2015, 2:45 PM  LOS: 1 day    Consultants:  none  Procedures:  None  Antibiotics:  Augmentin 11/7 >>  HPI/Subjective: Pt reports decrease in pain throughout her body and at the jaw.  Having regular BM's.  Objective: Filed Vitals:   09/10/15 1001 09/10/15 1130 09/10/15 1159 09/10/15 1400  BP: 124/65   128/62  Pulse: 67   68  Temp: 98.7 F (37.1 C) 98.5 F (36.9 C)  98.5 F (36.9 C)  TempSrc: Oral Oral  Oral  Resp: 18  13 18   Height:      Weight:      SpO2: 100%  100% 100%   Weight change:   Intake/Output Summary (Last 24 hours) at 09/10/15 1445 Last data filed at 09/10/15 1430  Gross per 24 hour  Intake   1390 ml  Output      0 ml  Net   1390 ml    General: Alert, awake, oriented x3, in no apparent distress. HEENT: [place]/AT [place]ER[place], EOMI, anicteric OROPHARYNX:  Moist, No exudate/ erythema/lesions.  Heart: Regular rate and rhythm,  without murmurs, rubs, gallops, PMI non-displaced, no heaves or thrills on palpation.  Lungs: Clear to auscultation, no wheezing or rhonchi noted. No increased vocal fremitus resonant to percussion  Abdomen: Soft, nontender, nondistended, positive bowel sounds, no masses no hepatosplenomegaly noted.  Neuro: No focal neurological deficits noted cranial nerves II through XII grossly intact.  Strength at functional baseline in bilateral upper and lower extremities. Musculoskeletal: No warm swelling or erythema around joints, no spinal tenderness noted. Psychiatric: Patient alert and oriented x3, good insight and cognition, good recent to remote recall.    Data Reviewed: Basic Metabolic Panel:  Recent Labs Lab 09/08/15 2216 09/09/15 0400  NA 138 138  K 3.3* 3.8  CL 104 107  CO2 23 24  GLUCOSE 95 93  BUN 6 5*  CREATININE 0.62 0.47  CALCIUM 9.5 9.0  MG  --  1.8   Liver Function Tests:  Recent Labs Lab 09/08/15 2216 09/09/15 0400  AST 27 23  ALT 28 25  ALKPHOS 63 60  BILITOT 2.0* 2.3*  PROT 7.9 7.4  ALBUMIN 4.2 3.8   No results for input(s): LIPASE, AMYLASE in the last 168 hours. No results for input(s): AMMONIA in the last 168 hours. CBC:  Recent Labs Lab 09/08/15 2216 09/09/15 0400 09/10/15 0345  WBC 12.8* 12.6* 11.7*  NEUTROABS 8.8*  --  7.3  HGB 8.3* 7.9* 8.5*  HCT 23.9*  22.6* 24.4*  MCV 85.7 86.3 85.6  PLT 594* 548* PLATELET CLUMPS NOTED ON SMEAR, COUNT APPEARS ADEQUATE   Cardiac Enzymes: No results for input(s): CKTOTAL, CKMB, CKMBINDEX, TROPONINI in the last 168 hours. BNP (last 3 results) No results for input(s): BNP in the last 8760 hours.  ProBNP (last 3 results) No results for input(s): PROBNP in the last 8760 hours.  CBG: No results for input(s): GLUCAP in the last 168 hours.  No results found for this or any previous visit (from the past 240 hour(s)).   Studies: Dg Chest 2 View  09/08/2015  CLINICAL DATA:  Shortness of breath starting this  morning EXAM: CHEST  2 VIEW COMPARISON:  07/19/2015 FINDINGS: Chronic interstitial coarsening at the bases, mild compatible with scar. There is no edema, consolidation, effusion, or pneumothorax. Normal heart size and mediastinal contours. No acute osseous findings. IMPRESSION: Stable exam.  No evidence of acute cardiopulmonary disease. Electronically Signed   By: Monte Fantasia M.D.   On: 09/08/2015 21:13   Ct Maxillofacial W/cm  09/09/2015  CLINICAL DATA:  Right facial swelling and pain. Dental caries. History of sickle cell disease. EXAM: CT MAXILLOFACIAL WITH CONTRAST TECHNIQUE: Multidetector CT imaging of the maxillofacial structures was performed with intravenous contrast. Multiplanar CT image reconstructions were also generated. A small metallic BB was placed on the right temple in order to reliably differentiate right from left. CONTRAST:  68mL OMNIPAQUE IOHEXOL 300 MG/ML  SOLN COMPARISON:  Head CT 05/16/2014 FINDINGS: Visualized intracranial structures are within normal. Orbits are normal. Paranasal sinuses and mastoid air cells are clear. Exam demonstrates a dental carie involving the right lower first molar tooth with lucency adjacent the roots of this tooth compatible with into periodontal disease. There is minimal stranding of the subcutaneous fat superficial to the body of the right mandible. There is no evidence of focal fluid collection/subperiosteal abscess. There is mild reactive adenopathy over the right submandibular region and cervical chain. Remainder the exam is unremarkable. IMPRESSION: Mild subcutaneous edema superficial to the body of the right mandible. Evidence of adjacent dental caries and periodontal disease involving the right lower first molar tooth. No evidence of focal fluid collection/subperiosteal abscess. Mild associated reactive adenopathy. Electronically Signed   By: Marin Olp M.D.   On: 09/09/2015 18:14    Scheduled Meds: . amoxicillin-clavulanate  1 tablet Oral  Q12H  . folic acid  1 mg Oral Daily  . HYDROmorphone   Intravenous 6 times per day  . ketorolac  30 mg Intravenous 4 times per day  . senna-docusate  1 tablet Oral BID   Continuous Infusions: . sodium chloride 75 mL/hr at 09/09/15 0255    Time spent 30 minutes.

## 2015-09-11 NOTE — Progress Notes (Signed)
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 Hb SS with crisis: Pt rates pain as 6/10 throughout her body. She has used 20.5 mg with 41/41:demands/deliveries in the last 24 hours.  Will continue Toradol and PCA at current settings .Will re-assess pain this afternoon ann make a decision about transitioning to oral analgesics.  2. Dental pain/facial swelling: Facial swelling and tenderness of dentition of the lower teeth have essentially resolved. No evidence of abscess on maxillofacial CT. Continue Augmentin . Day #3 3. Leukocytosis: Mild leukocytosis likely associated with crisis.  4. Anemia of chronic disease: Hb at baseline.    Code Status: Full Code Family Communication: N/A Disposition Plan: Not yet ready for discharge  [place].  Pager [phone]. If 7PM-7AM, please contact night-coverage.  09/11/2015, 3:20 PM  LOS: 2 days    Consultants:  none  Procedures:  None  Antibiotics:  Augmentin 11/7 >>  HPI/Subjective: Pt reports decrease in pain throughout her body and at the jaw.  Pain is essentially l;ocalized to her legs at this time. Having regular BM's.  Objective: Filed Vitals:   09/11/15 0502 09/11/15 0954 09/11/15 1030 09/11/15 1246  BP: 116/57  108/54   Pulse: 69  60   Temp: 98.4 F (36.9 C)  97.8 F (36.6 C)   TempSrc: Oral  Oral   Resp: 14 15 16 10   Height:      Weight:      SpO2: 98% 97% 96% 94%   Weight change:   Intake/Output Summary (Last 24 hours) at 09/11/15 1520 Last data filed at 09/11/15 0503  Gross per 24 hour  Intake   1080 ml  Output      0 ml  Net   1080 ml    General: Alert, awake, oriented x3, in no apparent distress. HEENT: [place]/AT PEER[place],  EOMI, anicteric OROPHARYNX:  Moist, No exudate/ erythema/lesions.  Heart: Regular rate and rhythm, without murmurs, rubs, gallops, PMI non-displaced, no heaves or thrills on palpation.  Lungs: Clear to auscultation, no wheezing or rhonchi noted. No increased vocal fremitus resonant to percussion  Abdomen: Soft, nontender, nondistended, positive bowel sounds, no masses no hepatosplenomegaly noted.  Neuro: No focal neurological deficits noted cranial nerves II through XII grossly intact.  Strength at functional baseline in bilateral upper and lower extremities. Musculoskeletal: No warm swelling or erythema around joints, no spinal tenderness noted. Psychiatric: Patient alert and oriented x3, good insight and cognition, good recent to remote recall.    Data Reviewed: Basic Metabolic Panel:  Recent Labs Lab 09/08/15 2216 09/09/15 0400  NA 138 138  K 3.3* 3.8  CL 104 107  CO2 23 24  GLUCOSE 95 93  BUN 6 5*  CREATININE 0.62 0.47  CALCIUM 9.5 9.0  MG  --  1.8   Liver Function Tests:  Recent Labs Lab 09/08/15 2216 09/09/15 0400  AST 27 23  ALT 28 25  ALKPHOS 63 60  BILITOT 2.0* 2.3*  PROT 7.9 7.4  ALBUMIN 4.2 3.8   No results for input(s): LIPASE, AMYLASE in the last 168 hours. No results for input(s): AMMONIA in the last 168 hours. CBC:  Recent Labs Lab 09/08/15 2216 09/09/15 0400 09/10/15 0345  WBC 12.8*  12.6* 11.7*  NEUTROABS 8.8*  --  7.3  HGB 8.3* 7.9* 8.5*  HCT 23.9* 22.6* 24.4*  MCV 85.7 86.3 85.6  PLT 594* 548* PLATELET CLUMPS NOTED ON SMEAR, COUNT APPEARS ADEQUATE   Cardiac Enzymes: No results for input(s): CKTOTAL, CKMB, CKMBINDEX, TROPONINI in the last 168 hours. BNP (last 3 results) No results for input(s): BNP in the last 8760 hours.  ProBNP (last 3 results) No results for input(s): PROBNP in the last 8760 hours.  CBG: No results for input(s): GLUCAP in the last 168 hours.  No results found for this or any previous visit (from the past 240  hour(s)).   Studies: Dg Chest 2 View  09/08/2015  CLINICAL DATA:  Shortness of breath starting this morning EXAM: CHEST  2 VIEW COMPARISON:  07/19/2015 FINDINGS: Chronic interstitial coarsening at the bases, mild compatible with scar. There is no edema, consolidation, effusion, or pneumothorax. Normal heart size and mediastinal contours. No acute osseous findings. IMPRESSION: Stable exam.  No evidence of acute cardiopulmonary disease. Electronically Signed   By: Monte Fantasia M.D.   On: 09/08/2015 21:13   Ct Maxillofacial W/cm  09/09/2015  CLINICAL DATA:  Right facial swelling and pain. Dental caries. History of sickle cell disease. EXAM: CT MAXILLOFACIAL WITH CONTRAST TECHNIQUE: Multidetector CT imaging of the maxillofacial structures was performed with intravenous contrast. Multiplanar CT image reconstructions were also generated. A small metallic BB was placed on the right temple in order to reliably differentiate right from left. CONTRAST:  78mL OMNIPAQUE IOHEXOL 300 MG/ML  SOLN COMPARISON:  Head CT 05/16/2014 FINDINGS: Visualized intracranial structures are within normal. Orbits are normal. Paranasal sinuses and mastoid air cells are clear. Exam demonstrates a dental carie involving the right lower first molar tooth with lucency adjacent the roots of this tooth compatible with into periodontal disease. There is minimal stranding of the subcutaneous fat superficial to the body of the right mandible. There is no evidence of focal fluid collection/subperiosteal abscess. There is mild reactive adenopathy over the right submandibular region and cervical chain. Remainder the exam is unremarkable. IMPRESSION: Mild subcutaneous edema superficial to the body of the right mandible. Evidence of adjacent dental caries and periodontal disease involving the right lower first molar tooth. No evidence of focal fluid collection/subperiosteal abscess. Mild associated reactive adenopathy. Electronically Signed   By:  Marin Olp M.D.   On: 09/09/2015 18:14    Scheduled Meds: . amoxicillin-clavulanate  1 tablet Oral Q12H  . folic acid  1 mg Oral Daily  . HYDROmorphone   Intravenous 6 times per day  . ketorolac  30 mg Intravenous 4 times per day  . senna-docusate  1 tablet Oral BID   Continuous Infusions: . sodium chloride 10 mL/hr at 09/10/15 1448    Time spent 23 minutes.

## 2015-09-12 DIAGNOSIS — K047 Periapical abscess without sinus: Secondary | ICD-10-CM

## 2015-09-12 LAB — BASIC METABOLIC PANEL
ANION GAP: 10 (ref 5–15)
BUN: <5 mg/dL — ABNORMAL LOW (ref 6–20)
CHLORIDE: 100 mmol/L — AB (ref 101–111)
CO2: 27 mmol/L (ref 22–32)
Calcium: 9.7 mg/dL (ref 8.9–10.3)
Creatinine, Ser: 0.46 mg/dL (ref 0.44–1.00)
GFR calc Af Amer: >60 mL/min (ref >60)
GFR calc non Af Amer: >60 mL/min (ref >60)
GLUCOSE: 105 mg/dL — AB (ref 65–99)
POTASSIUM: 3.9 mmol/L (ref 3.5–5.1)
Sodium: 137 mmol/L (ref 135–145)

## 2015-09-12 LAB — URINALYSIS, ROUTINE W REFLEX MICROSCOPIC
Glucose, UA: NEGATIVE mg/dL
Ketones, ur: NEGATIVE mg/dL
Nitrite: NEGATIVE
PH: 6 (ref 5.0–8.0)
Protein, ur: NEGATIVE mg/dL
SPECIFIC GRAVITY, URINE: 1.013 (ref 1.005–1.030)
Urobilinogen, UA: 1 mg/dL (ref 0.0–1.0)

## 2015-09-12 LAB — CBC WITH DIFFERENTIAL/PLATELET
BASOS PCT: 0 %
Basophils Absolute: 0 10*3/uL (ref 0.0–0.1)
EOS PCT: 2 %
Eosinophils Absolute: 0.1 10*3/uL (ref 0.0–0.7)
HCT: 25.7 % — ABNORMAL LOW (ref 36.0–46.0)
HEMOGLOBIN: 9.1 g/dL — AB (ref 12.0–15.0)
LYMPHS PCT: 33 %
Lymphs Abs: 2.2 10*3/uL (ref 0.7–4.0)
MCH: 29.9 pg (ref 26.0–34.0)
MCHC: 35.4 g/dL (ref 30.0–36.0)
MCV: 84.5 fL (ref 78.0–100.0)
MONOS PCT: 16 %
Monocytes Absolute: 1.1 10*3/uL — ABNORMAL HIGH (ref 0.1–1.0)
NRBC: 10 /100{WBCs} — AB
Neutro Abs: 3.3 10*3/uL (ref 1.7–7.7)
Neutrophils Relative %: 49 %
Platelets: 455 10*3/uL — ABNORMAL HIGH (ref 150–400)
RBC: 3.04 MIL/uL — ABNORMAL LOW (ref 3.87–5.11)
RDW: 17.5 % — AB (ref 11.5–15.5)
WBC: 6.7 10*3/uL (ref 4.0–10.5)

## 2015-09-12 LAB — URINE MICROSCOPIC-ADD ON

## 2015-09-12 MED ORDER — OXYCODONE-ACETAMINOPHEN 5-325 MG PO TABS
1.0000 | ORAL_TABLET | ORAL | Status: DC
  Administered 2015-09-13: 1 via ORAL
  Filled 2015-09-12 (×2): qty 1

## 2015-09-12 MED ORDER — OXYCODONE HCL 5 MG PO TABS
5.0000 mg | ORAL_TABLET | ORAL | Status: DC
  Administered 2015-09-13: 5 mg via ORAL
  Filled 2015-09-12 (×2): qty 1

## 2015-09-12 NOTE — Progress Notes (Signed)
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 Hb SS with crisis: Pt rates pain as 6/10in her legs. She has used 28 mg with 56/456:demands/deliveries in the last 24 hours.  Will continue Toradol and PCA at current settings .Will re-assess pain tomorrow make a decision about transitioning to oral analgesics.  2. Dental pain/facial swelling: Swelling resolved. Augmentin . Day #5/10 3. Leukocytosis: Mild leukocytosis likely associated with crisis.  4. Anemia of chronic disease: Hb at baseline.    Code Status: Full Code Family Communication: N/A Disposition Plan: Not yet ready for discharge  [place].  Pager [phone]. If 7PM-7AM, please contact night-coverage.  09/12/2015, 3:43 PM  LOS: 3 days    Consultants:  none  Procedures:  None  Antibiotics:  Augmentin 11/7 >>  HPI/Subjective: Pt reports pain at 6/10 in legs. Having regular BM's.  Objective: Filed Vitals:   09/12/15 0530 09/12/15 0811 09/12/15 0943 09/12/15 1158  BP: 129/88  109/83   Pulse: 75  51   Temp: 97.9 F (36.6 C)  98.5 F (36.9 C)   TempSrc: Oral  Oral   Resp: 12 14 15 14   Height:      Weight: 183 lb 10.3 oz (83.3 kg)     SpO2: 100% 95% 100%    Weight change:   Intake/Output Summary (Last 24 hours) at 09/12/15 1543 Last data filed at 09/12/15 0944  Gross per 24 hour  Intake   1015 ml  Output      0 ml  Net   1015 ml    General: Alert, awake, oriented x3, in no apparent distress. HEENT: [place]/AT [place]ER[place], EOMI, anicteric OROPHARYNX:  Moist, No exudate/ erythema/lesions.  Heart: Regular rate and rhythm, without murmurs, rubs, gallops, PMI non-displaced, no heaves or thrills on palpation.  Lungs: Clear to  auscultation, no wheezing or rhonchi noted. No increased vocal fremitus resonant to percussion  Abdomen: Soft, nontender, nondistended, positive bowel sounds, no masses no hepatosplenomegaly noted.  Neuro: No focal neurological deficits noted cranial nerves II through XII grossly intact.  Strength at functional baseline in bilateral upper and lower extremities. Musculoskeletal: No warm swelling or erythema around joints, no spinal tenderness noted. Psychiatric: Patient alert and oriented x3, good insight and cognition, good recent to remote recall.    Data Reviewed: Basic Metabolic Panel:  Recent Labs Lab 09/08/15 2216 09/09/15 0400 09/12/15 0725  NA 138 138 137  K 3.3* 3.8 3.9  CL 104 107 100*  CO2 23 24 27   GLUCOSE 95 93 105*  BUN 6 5* <5*  CREATININE 0.62 0.47 0.46  CALCIUM 9.5 9.0 9.7  MG  --  1.8  --    Liver Function Tests:  Recent Labs Lab 09/08/15 2216 09/09/15 0400  AST 27 23  ALT 28 25  ALKPHOS 63 60  BILITOT 2.0* 2.3*  PROT 7.9 7.4  ALBUMIN 4.2 3.8   No results for input(s): LIPASE, AMYLASE in the last 168 hours. No results for input(s): AMMONIA in the last 168 hours. CBC:  Recent Labs Lab 09/08/15 2216 09/09/15 0400 09/10/15 0345 09/12/15 0725  WBC 12.8* 12.6* 11.7* 6.7  NEUTROABS 8.8*  --  7.3 3.3  HGB 8.3* 7.9* 8.5* 9.1*  HCT 23.9* 22.6* 24.4* 25.7*  MCV 85.7 86.3 85.6 84.5  PLT 594* 548* PLATELET CLUMPS NOTED ON SMEAR, COUNT APPEARS ADEQUATE 455*   Cardiac Enzymes: No results for input(s): CKTOTAL, CKMB, CKMBINDEX, TROPONINI in the last 168 hours. BNP (last 3 results) No results for input(s): BNP in the last 8760 hours.  ProBNP (last 3 results) No results for input(s): PROBNP in the last 8760 hours.  CBG: No results for input(s): GLUCAP in the last 168 hours.  No results found for this or any previous visit (from the past 240 hour(s)).   Studies: Dg Chest 2 View  09/08/2015  CLINICAL DATA:  Shortness of breath starting this morning  EXAM: CHEST  2 VIEW COMPARISON:  07/19/2015 FINDINGS: Chronic interstitial coarsening at the bases, mild compatible with scar. There is no edema, consolidation, effusion, or pneumothorax. Normal heart size and mediastinal contours. No acute osseous findings. IMPRESSION: Stable exam.  No evidence of acute cardiopulmonary disease. Electronically Signed   By: Monte Fantasia M.D.   On: 09/08/2015 21:13   Ct Maxillofacial W/cm  09/09/2015  CLINICAL DATA:  Right facial swelling and pain. Dental caries. History of sickle cell disease. EXAM: CT MAXILLOFACIAL WITH CONTRAST TECHNIQUE: Multidetector CT imaging of the maxillofacial structures was performed with intravenous contrast. Multiplanar CT image reconstructions were also generated. A small metallic BB was placed on the right temple in order to reliably differentiate right from left. CONTRAST:  59mL OMNIPAQUE IOHEXOL 300 MG/ML  SOLN COMPARISON:  Head CT 05/16/2014 FINDINGS: Visualized intracranial structures are within normal. Orbits are normal. Paranasal sinuses and mastoid air cells are clear. Exam demonstrates a dental carie involving the right lower first molar tooth with lucency adjacent the roots of this tooth compatible with into periodontal disease. There is minimal stranding of the subcutaneous fat superficial to the body of the right mandible. There is no evidence of focal fluid collection/subperiosteal abscess. There is mild reactive adenopathy over the right submandibular region and cervical chain. Remainder the exam is unremarkable. IMPRESSION: Mild subcutaneous edema superficial to the body of the right mandible. Evidence of adjacent dental caries and periodontal disease involving the right lower first molar tooth. No evidence of focal fluid collection/subperiosteal abscess. Mild associated reactive adenopathy. Electronically Signed   By: Marin Olp M.D.   On: 09/09/2015 18:14    Scheduled Meds: . amoxicillin-clavulanate  1 tablet Oral Q12H  .  folic acid  1 mg Oral Daily  . HYDROmorphone   Intravenous 6 times per day  . ketorolac  30 mg Intravenous 4 times per day  . senna-docusate  1 tablet Oral BID   Continuous Infusions: . sodium chloride 10 mL/hr at 09/12/15 0503    Time spent 30 minutes.

## 2015-09-13 MED ORDER — OXYCODONE HCL 5 MG PO TABS
10.0000 mg | ORAL_TABLET | ORAL | Status: DC
  Administered 2015-09-13 – 2015-09-14 (×7): 10 mg via ORAL
  Filled 2015-09-13 (×7): qty 2

## 2015-09-13 MED ORDER — OXYCODONE-ACETAMINOPHEN 5-325 MG PO TABS
1.0000 | ORAL_TABLET | ORAL | Status: DC
  Administered 2015-09-13 – 2015-09-14 (×7): 1 via ORAL
  Filled 2015-09-13 (×7): qty 1

## 2015-09-13 MED ORDER — FLUCONAZOLE 100 MG PO TABS
100.0000 mg | ORAL_TABLET | Freq: Every day | ORAL | Status: DC
  Administered 2015-09-13 – 2015-09-14 (×2): 100 mg via ORAL
  Filled 2015-09-13 (×2): qty 1

## 2015-09-13 NOTE — Progress Notes (Signed)
SICKLE CELL SERVICE PROGRESS NOTE  Carmen [name] [id number] DOB: 1991-01-01 DOA: 09/08/2015 PCP: [name], MD  Assessment/Plan: Principal Problem:   Sickle cell disease with crisis ([place]) Active Problems:   Anemia   Leukocytosis   Hypokalemia   Dental caries   Pain in a tooth or teeth   Right facial swelling  1. Hb SS with crisis: Pt rates pain as 6/10in her legs. She has used 26 mg with 56/52:demands/deliveries in the last 24 hours.  She is currently on oral analgesics as well as the PCA pump. I will increase her oxycodone to 10 mg schedule every 4 hours with 5 mg of Percocet. Keep her on the PCA and plan for discharge tomorrow. 2. Dental pain/facial swelling: Swelling resolved. Augmentin . Day #5/10 3. Leukocytosis: Mild leukocytosis likely associated with crisis.  4. Anemia of chronic disease: Hb at baseline.  5. Vaginal candidiasis: Probably after starting Augmentin. I will start patient on Diflucan 100 mg daily for 3 days   Code Status: Full Code Family Communication: N/A Disposition Plan: Not yet ready for discharge  [facility]  Pager [phone]. If 7PM-7AM, please contact night-coverage.  09/13/2015, 1:30 PM  LOS: 4 days    Consultants:  none  Procedures:  None  Antibiotics:  Augmentin 11/7 >>  HPI/Subjective: Pt reports pain at 6/10 in legs. Having regular BM's.  Objective: Filed Vitals:   09/13/15 0345 09/13/15 0605 09/13/15 0813 09/13/15 1305  BP:  116/63    Pulse:  59    Temp:  98.1 F (36.7 C)    TempSrc:  Oral    Resp: 14 15 10 11   Height:      Weight:  81.7 kg (180 lb 1.9 oz)    SpO2: 100% 100% 100% 100%   Weight change: -1.6 kg (-3 lb 8.4 oz)  Intake/Output Summary (Last 24 hours) at 09/13/15 1330 Last data filed at 09/12/15 1900  Gross per 24 hour  Intake    120 ml  Output      0 ml  Net    120 ml    General: Alert, awake, oriented x3, in no apparent distress. HEENT: [place]/AT PEERL, EOMI, anicteric OROPHARYNX:  Moist, No  exudate/ erythema/lesions.  Heart: Regular rate and rhythm, without murmurs, rubs, gallops, PMI non-displaced, no heaves or thrills on palpation.  Lungs: Clear to auscultation, no wheezing or rhonchi noted. No increased vocal fremitus resonant to percussion  Abdomen: Soft, nontender, nondistended, positive bowel sounds, no masses no hepatosplenomegaly noted.  Neuro: No focal neurological deficits noted cranial nerves II through XII grossly intact.  Strength at functional baseline in bilateral upper and lower extremities. Musculoskeletal: No warm swelling or erythema around joints, no spinal tenderness noted. Psychiatric: Patient alert and oriented x3, good insight and cognition, good recent to remote recall.    Data Reviewed: Basic Metabolic Panel:  Recent Labs Lab 09/08/15 2216 09/09/15 0400 09/12/15 0725  NA 138 138 137  K 3.3* 3.8 3.9  CL 104 107 100*  CO2 23 24 27   GLUCOSE 95 93 105*  BUN 6 5* <5*  CREATININE 0.62 0.47 0.46  CALCIUM 9.5 9.0 9.7  MG  --  1.8  --    Liver Function Tests:  Recent Labs Lab 09/08/15 2216 09/09/15 0400  AST 27 23  ALT 28 25  ALKPHOS 63 60  BILITOT 2.0* 2.3*  PROT 7.9 7.4  ALBUMIN 4.2 3.8   No results for input(s): LIPASE, AMYLASE in the last 168 hours. No results for input(s):  AMMONIA in the last 168 hours. CBC:  Recent Labs Lab 09/08/15 2216 09/09/15 0400 09/10/15 0345 09/12/15 0725  WBC 12.8* 12.6* 11.7* 6.7  NEUTROABS 8.8*  --  7.3 3.3  HGB 8.3* 7.9* 8.5* 9.1*  HCT 23.9* 22.6* 24.4* 25.7*  MCV 85.7 86.3 85.6 84.5  PLT 594* 548* PLATELET CLUMPS NOTED ON SMEAR, COUNT APPEARS ADEQUATE 455*   Cardiac Enzymes: No results for input(s): CKTOTAL, CKMB, CKMBINDEX, TROPONINI in the last 168 hours. BNP (last 3 results) No results for input(s): BNP in the last 8760 hours.  ProBNP (last 3 results) No results for input(s): PROBNP in the last 8760 hours.  CBG: No results for input(s): GLUCAP in the last 168 hours.  No results  found for this or any previous visit (from the past 240 hour(s)).   Studies: Dg Chest 2 View  09/08/2015  CLINICAL DATA:  Shortness of breath starting this morning EXAM: CHEST  2 VIEW COMPARISON:  07/19/2015 FINDINGS: Chronic interstitial coarsening at the bases, mild compatible with scar. There is no edema, consolidation, effusion, or pneumothorax. Normal heart size and mediastinal contours. No acute osseous findings. IMPRESSION: Stable exam.  No evidence of acute cardiopulmonary disease. Electronically Signed   By: Monte Fantasia M.D.   On: 09/08/2015 21:13   Ct Maxillofacial W/cm  09/09/2015  CLINICAL DATA:  Right facial swelling and pain. Dental caries. History of sickle cell disease. EXAM: CT MAXILLOFACIAL WITH CONTRAST TECHNIQUE: Multidetector CT imaging of the maxillofacial structures was performed with intravenous contrast. Multiplanar CT image reconstructions were also generated. A small metallic BB was placed on the right temple in order to reliably differentiate right from left. CONTRAST:  35mL OMNIPAQUE IOHEXOL 300 MG/ML  SOLN COMPARISON:  Head CT 05/16/2014 FINDINGS: Visualized intracranial structures are within normal. Orbits are normal. Paranasal sinuses and mastoid air cells are clear. Exam demonstrates a dental carie involving the right lower first molar tooth with lucency adjacent the roots of this tooth compatible with into periodontal disease. There is minimal stranding of the subcutaneous fat superficial to the body of the right mandible. There is no evidence of focal fluid collection/subperiosteal abscess. There is mild reactive adenopathy over the right submandibular region and cervical chain. Remainder the exam is unremarkable. IMPRESSION: Mild subcutaneous edema superficial to the body of the right mandible. Evidence of adjacent dental caries and periodontal disease involving the right lower first molar tooth. No evidence of focal fluid collection/subperiosteal abscess. Mild  associated reactive adenopathy. Electronically Signed   By: Marin Olp M.D.   On: 09/09/2015 18:14    Scheduled Meds: . amoxicillin-clavulanate  1 tablet Oral Q12H  . folic acid  1 mg Oral Daily  . HYDROmorphone   Intravenous 6 times per day  . ketorolac  30 mg Intravenous 4 times per day  . oxyCODONE  10 mg Oral Q4H   And  . oxyCODONE-acetaminophen  1 tablet Oral Q4H  . senna-docusate  1 tablet Oral BID   Continuous Infusions: . sodium chloride 10 mL/hr at 09/12/15 0503    Time spent 30 minutes.

## 2015-09-13 NOTE — Progress Notes (Signed)
Pt called out asking for PRN medication and stated PCA was near end. Entered pt room pt asleep. Pt asleep, pt asked if she wanted PRN and schedule meds, pt open eyes, said yes and went back to sleep. PCA remains near end. All PRN and scheduled med hold. Will continue to monitor.

## 2015-09-13 NOTE — Progress Notes (Signed)
Pt. complained of burning with urination and states having whitish vaginal discharge and itching.MD notified and new order received.

## 2015-09-14 MED ORDER — AMOXICILLIN-POT CLAVULANATE 875-125 MG PO TABS: 1.0000 | ORAL_TABLET | Freq: Two times a day (BID) | ORAL | Status: DC

## 2015-09-14 MED ORDER — FOLIC ACID 1 MG PO TABS
1.0000 mg | ORAL_TABLET | Freq: Every day | ORAL | Status: DC
Start: 2015-09-14 — End: 2015-12-19

## 2015-09-14 MED ORDER — FLUCONAZOLE 100 MG PO TABS: 100.0000 mg | ORAL_TABLET | Freq: Every day | ORAL | Status: DC

## 2015-09-14 MED ORDER — FOLIC ACID 1 MG PO TABS
1.0000 mg | ORAL_TABLET | Freq: Every day | ORAL | Status: DC
Start: 2015-09-14 — End: 2015-09-14

## 2015-09-14 NOTE — Discharge Summary (Signed)
Physician Discharge Summary  Patient ID: Carmen [name] MRN: [id number] DOB/AGE: September 16, 1991 24 y.o.  Admit date: 09/08/2015 Discharge date: 09/14/2015  Admission Diagnoses:  Discharge Diagnoses:  Principal Problem:   Sickle cell disease with crisis [facility]) Active Problems:   Anemia   Leukocytosis   Hypokalemia   Dental caries   Pain in a tooth or teeth   Right facial swelling   Discharged Condition: good  Hospital Course: Patient admitted with sickle cell painful crisis. Has been relatively opiate naive until recently. She also had recent Dental abscess and pain which necessitated antibiotics use and may have triggered her crisis. Patient was treated with IV dilaudid PCA, Toradol as well as Augmentin for the dental infection. She responded to treatment and was transitioned to oral Percocet and oxycodone. She was discharged home on home regimen to follow up with PCP.  Consults: None  Significant Diagnostic Studies: labs: Serial CBcs and CMPs checked. No evidence of hemolysis.  Treatments: IV hydration, antibiotics: Augmentin and analgesia: Dilaudid  Discharge Exam: Blood pressure 119/64, pulse 65, temperature 97.5 F (36.4 C), temperature source Oral, resp. rate 9, height 5\' 3"  (1.6 m), weight 81.7 kg (180 lb 1.9 oz), last menstrual period 09/01/2015, SpO2 100 %. General appearance: alert, cooperative and no distress Back: symmetric, no curvature. ROM normal. No CVA tenderness. Resp: clear to auscultation bilaterally Chest wall: no tenderness Cardio: regular rate and rhythm, S1, S2 normal, no murmur, click, rub or gallop GI: soft, non-tender; bowel sounds normal; no masses,  no organomegaly Extremities: extremities normal, atraumatic, no cyanosis or edema Skin: Skin color, texture, turgor normal. No rashes or lesions Neurologic: Grossly normal  Disposition: 01-Home or Self Care     Medication List    TAKE these medications        amoxicillin-clavulanate 875-125 MG  tablet  Commonly known as:  AUGMENTIN  Take 1 tablet by mouth every 12 (twelve) hours.     benzonatate 200 MG capsule  Commonly known as:  TESSALON  Take 1 capsule (200 mg total) by mouth 3 (three) times daily as needed for cough.     fluconazole 100 MG tablet  Commonly known as:  DIFLUCAN  Take 1 tablet (100 mg total) by mouth daily.     folic acid 1 MG tablet  Commonly known as:  FOLVITE  Take 1 tablet (1 mg total) by mouth daily.     ibuprofen 800 MG tablet  Commonly known as:  ADVIL,MOTRIN  Take 1 tablet (800 mg total) by mouth 4 (four) times daily.     oxyCODONE-acetaminophen 10-325 MG tablet  Commonly known as:  PERCOCET  Take 1 tablet by mouth every 4 (four) hours as needed for pain.         SignedBarbette Merino 09/14/2015, 7:42 AM

## 2015-09-14 NOTE — Clinical Social Work Note (Signed)
CSW received a call from RN requesting taxi voucher for pt discharge  CSW met with pt to confirm address and provided RN with voucher  .Dede Query, LCSW [facility] Clinical Social Worker - Weekend Coverage cell #: [phone]

## 2015-09-23 ENCOUNTER — Emergency Department (HOSPITAL_COMMUNITY): Payer: Medicaid Other

## 2015-09-23 ENCOUNTER — Emergency Department (HOSPITAL_COMMUNITY)
Admission: EM | Admit: 2015-09-23 | Discharge: 2015-09-23 | Disposition: A | Discharge status: Home / Self Care | Payer: Medicaid Other | Attending: Emergency Medicine | Admitting: Emergency Medicine

## 2015-09-23 ENCOUNTER — Encounter (HOSPITAL_COMMUNITY): Payer: Self-pay | Admitting: *Deleted

## 2015-09-23 ENCOUNTER — Non-Acute Institutional Stay (EMERGENCY_DEPARTMENT_HOSPITAL)
Admission: AD | Admit: 2015-09-23 | Discharge: 2015-09-23 | Disposition: A | Discharge status: Home / Self Care | Payer: Medicaid Other | Source: Home / Self Care | Attending: Internal Medicine | Admitting: Internal Medicine

## 2015-09-23 ENCOUNTER — Encounter (HOSPITAL_COMMUNITY): Payer: Self-pay | Admitting: Emergency Medicine

## 2015-09-23 DIAGNOSIS — F1123 Opioid dependence with withdrawal: Secondary | ICD-10-CM | POA: Diagnosis not present

## 2015-09-23 DIAGNOSIS — D5700 Hb-SS disease with crisis, unspecified: Secondary | ICD-10-CM | POA: Diagnosis not present

## 2015-09-23 DIAGNOSIS — F1193 Opioid use, unspecified with withdrawal: Secondary | ICD-10-CM

## 2015-09-23 DIAGNOSIS — E876 Hypokalemia: Secondary | ICD-10-CM | POA: Diagnosis not present

## 2015-09-23 DIAGNOSIS — R05 Cough: Secondary | ICD-10-CM | POA: Diagnosis not present

## 2015-09-23 DIAGNOSIS — Z791 Long term (current) use of non-steroidal anti-inflammatories (NSAID): Secondary | ICD-10-CM | POA: Insufficient documentation

## 2015-09-23 DIAGNOSIS — Z79899 Other long term (current) drug therapy: Secondary | ICD-10-CM | POA: Diagnosis not present

## 2015-09-23 DIAGNOSIS — R197 Diarrhea, unspecified: Secondary | ICD-10-CM

## 2015-09-23 LAB — COMPREHENSIVE METABOLIC PANEL
ALT: 17 U/L (ref 14–54)
ANION GAP: 8 (ref 5–15)
AST: 22 U/L (ref 15–41)
Albumin: 4 g/dL (ref 3.5–5.0)
Alkaline Phosphatase: 59 U/L (ref 38–126)
BILIRUBIN TOTAL: 1.5 mg/dL — AB (ref 0.3–1.2)
BUN: 7 mg/dL (ref 6–20)
CHLORIDE: 105 mmol/L (ref 101–111)
CO2: 24 mmol/L (ref 22–32)
Calcium: 9.6 mg/dL (ref 8.9–10.3)
Creatinine, Ser: 0.57 mg/dL (ref 0.44–1.00)
GFR calc non Af Amer: >60 mL/min (ref >60)
Glucose, Bld: 86 mg/dL (ref 65–99)
POTASSIUM: 3.1 mmol/L — AB (ref 3.5–5.1)
Sodium: 137 mmol/L (ref 135–145)
Total Protein: 7.7 g/dL (ref 6.5–8.1)

## 2015-09-23 LAB — CBC WITH DIFFERENTIAL/PLATELET
Basophils Absolute: 0 10*3/uL (ref 0.0–0.1)
Basophils Relative: 0 %
Eosinophils Absolute: 0.1 10*3/uL (ref 0.0–0.7)
Eosinophils Relative: 1 %
HCT: 25.2 % — ABNORMAL LOW (ref 36.0–46.0)
HEMOGLOBIN: 8.8 g/dL — AB (ref 12.0–15.0)
LYMPHS ABS: 5.1 10*3/uL — AB (ref 0.7–4.0)
LYMPHS PCT: 47 %
MCH: 29.6 pg (ref 26.0–34.0)
MCHC: 34.9 g/dL (ref 30.0–36.0)
MCV: 84.8 fL (ref 78.0–100.0)
Monocytes Absolute: 1.2 10*3/uL — ABNORMAL HIGH (ref 0.1–1.0)
Monocytes Relative: 11 %
NEUTROS PCT: 41 %
Neutro Abs: 4.4 10*3/uL (ref 1.7–7.7)
Platelets: 804 10*3/uL — ABNORMAL HIGH (ref 150–400)
RBC: 2.97 MIL/uL — AB (ref 3.87–5.11)
RDW: 16.6 % — ABNORMAL HIGH (ref 11.5–15.5)
WBC: 10.8 10*3/uL — AB (ref 4.0–10.5)

## 2015-09-23 LAB — RETICULOCYTES
RBC.: 3.03 MIL/uL — ABNORMAL LOW (ref 3.87–5.11)
RETIC COUNT ABSOLUTE: 206 10*3/uL — AB (ref 19.0–186.0)
RETIC CT PCT: 6.8 % — AB (ref 0.4–3.1)

## 2015-09-23 MED ORDER — DEXTROSE-NACL 5-0.45 % IV SOLN
INTRAVENOUS | Status: DC
  Administered 2015-09-23: 10:00:00 via INTRAVENOUS

## 2015-09-23 MED ORDER — DIPHENHYDRAMINE HCL 12.5 MG/5ML PO ELIX
12.5000 mg | ORAL_SOLUTION | Freq: Four times a day (QID) | ORAL | Status: DC | PRN
  Administered 2015-09-23: 12.5 mg via ORAL
  Filled 2015-09-23 (×2): qty 5

## 2015-09-23 MED ORDER — ONDANSETRON HCL 4 MG/2ML IJ SOLN
4.0000 mg | Freq: Once | INTRAMUSCULAR | Status: AC
  Administered 2015-09-23: 4 mg via INTRAVENOUS
  Filled 2015-09-23: qty 2

## 2015-09-23 MED ORDER — OXYCODONE-ACETAMINOPHEN 5-325 MG PO TABS
1.0000 | ORAL_TABLET | Freq: Once | ORAL | Status: AC
  Administered 2015-09-23: 1 via ORAL
  Filled 2015-09-23: qty 1

## 2015-09-23 MED ORDER — LOPERAMIDE HCL 2 MG PO CAPS
4.0000 mg | ORAL_CAPSULE | Freq: Once | ORAL | Status: AC
  Administered 2015-09-23: 4 mg via ORAL
  Filled 2015-09-23: qty 2

## 2015-09-23 MED ORDER — POTASSIUM CHLORIDE CRYS ER 20 MEQ PO TBCR
40.0000 meq | EXTENDED_RELEASE_TABLET | Freq: Once | ORAL | Status: AC
  Administered 2015-09-23: 40 meq via ORAL
  Filled 2015-09-23: qty 2

## 2015-09-23 MED ORDER — ONDANSETRON HCL 4 MG/2ML IJ SOLN
4.0000 mg | Freq: Four times a day (QID) | INTRAMUSCULAR | Status: DC | PRN
  Administered 2015-09-23: 4 mg via INTRAVENOUS
  Filled 2015-09-23: qty 2

## 2015-09-23 MED ORDER — CYCLOBENZAPRINE HCL 10 MG PO TABS: 10.0000 mg | ORAL_TABLET | Freq: Three times a day (TID) | ORAL | Status: DC | PRN

## 2015-09-23 MED ORDER — NAPROXEN 500 MG PO TABS: ORAL_TABLET | ORAL | Status: DC

## 2015-09-23 MED ORDER — KETOROLAC TROMETHAMINE 30 MG/ML IJ SOLN
30.0000 mg | Freq: Once | INTRAMUSCULAR | Status: AC
  Administered 2015-09-23: 30 mg via INTRAVENOUS
  Filled 2015-09-23: qty 1

## 2015-09-23 MED ORDER — HYDROMORPHONE 1 MG/ML IV SOLN
INTRAVENOUS | Status: DC
  Administered 2015-09-23: 10:00:00 via INTRAVENOUS
  Administered 2015-09-23: 9 mg via INTRAVENOUS
  Filled 2015-09-23: qty 25

## 2015-09-23 MED ORDER — SODIUM CHLORIDE 0.9 % IV BOLUS (SEPSIS)
1000.0000 mL | Freq: Once | INTRAVENOUS | Status: DC
Start: 2015-09-23 — End: 2015-09-23

## 2015-09-23 MED ORDER — KETOROLAC TROMETHAMINE 30 MG/ML IJ SOLN
30.0000 mg | Freq: Once | INTRAMUSCULAR | Status: DC
  Filled 2015-09-23: qty 1

## 2015-09-23 MED ORDER — OXYCODONE HCL 5 MG PO TABS
5.0000 mg | ORAL_TABLET | Freq: Once | ORAL | Status: AC
  Administered 2015-09-23: 5 mg via ORAL
  Filled 2015-09-23: qty 1

## 2015-09-23 MED ORDER — PROMETHAZINE HCL 25 MG/ML IJ SOLN
6.2500 mg | Freq: Once | INTRAMUSCULAR | Status: AC
  Administered 2015-09-23: 6.25 mg via INTRAVENOUS
  Filled 2015-09-23: qty 1

## 2015-09-23 MED ORDER — SODIUM CHLORIDE 0.9 % IJ SOLN: 9.0000 mL | INTRAMUSCULAR | Status: DC | PRN

## 2015-09-23 MED ORDER — SODIUM CHLORIDE 0.9 % IV BOLUS (SEPSIS)
1000.0000 mL | Freq: Once | INTRAVENOUS | Status: AC
  Administered 2015-09-23: 1000 mL via INTRAVENOUS

## 2015-09-23 MED ORDER — POTASSIUM CHLORIDE CRYS ER 20 MEQ PO TBCR: 20.0000 meq | EXTENDED_RELEASE_TABLET | Freq: Two times a day (BID) | ORAL | Status: DC

## 2015-09-23 MED ORDER — NALOXONE HCL 0.4 MG/ML IJ SOLN: 0.4000 mg | INTRAMUSCULAR | Status: DC | PRN

## 2015-09-23 MED ORDER — SODIUM CHLORIDE 0.9 % IV SOLN
12.5000 mg | Freq: Four times a day (QID) | INTRAVENOUS | Status: DC | PRN
  Filled 2015-09-23: qty 0.25

## 2015-09-23 MED ORDER — HYDROMORPHONE HCL 2 MG/ML IJ SOLN
2.0000 mg | Freq: Once | INTRAMUSCULAR | Status: AC
  Administered 2015-09-23: 2 mg via INTRAVENOUS
  Filled 2015-09-23: qty 1

## 2015-09-23 NOTE — Discharge Instructions (Signed)
Take imodium for your diarrhea. Take the potassium pills until gone. Take the naproxen and flexeril for your body aches. You need to contact Dr Noland Fordyce office to get more of your narcotic pain medication.

## 2015-09-23 NOTE — Progress Notes (Signed)
IV conts to beep and unable to push antiemetic. IV nurse consult ordered.

## 2015-09-23 NOTE — Progress Notes (Signed)
Patient ID: [name], female   DOB: April 18, 1991, 24 y.o.   MRN: [id number] Discharge instructions given to patient, IV removed without difficulty, belongings returned.  Patient rates pain improved to 6/10 on pain scale.  Patient ambulatory at discharge.

## 2015-09-23 NOTE — Progress Notes (Signed)
Pt received from the emergency department via wheelchair. Pt alert and oriented. Has peripheral IV that is saline lock in her right hand. PIV flushes without difficulty. VSS. Pt states her pain is at a 10/10 now. Provider in to see patient.

## 2015-09-23 NOTE — ED Notes (Signed)
Pt here via ems with c/o sickle cell pain. Pt states pain is "all over" and is a 10/10. Pt states she ran out of her pain medicen percocet.  Pt states she goes back for a refill on Wed. Pt is ambulatory and alert and oriented x 4.

## 2015-09-23 NOTE — H&P (Signed)
[facility] History and Physical   Date: 09/23/2015  Patient name: ENDIYAH MCKAGUE Medical record number: [id number] Date of birth: [birth year] Age: 24 y.o. Gender: female PCP: [name], MD  Attending physician: Tresa Garter, MD  Chief Complaint: Low back and right rib  History of Present Illness: Ms. Hassana Worner, a 24 year old female with a history of sickle cell anemia, HbSS presents with pain primarily to low back and right rib. She states that pain is consistent with sickle cell anemia. She attributes current pain crisis to cold weather. Also, she states that she is out of pain medications. Her follow up appointment with Dr. [name] is scheduled on 09/24/2015.  Patient was treated in the emergency room earlier today. She last had Dilaudid 2 mg at 0420 and Percocet at [phone] with minimal relief. Current pain intensity is 10/10 described as constant and throbbing. She reports fatigue, she denies headache, chest pains, shortness of breath, nausea, vomiting or diarrhea.   Meds: Prescriptions prior to admission  Medication Sig Dispense Refill Last Dose  . amoxicillin-clavulanate (AUGMENTIN) 875-125 MG tablet Take 1 tablet by mouth every 12 (twelve) hours. 10 tablet 0 09/20/2015  . benzonatate (TESSALON) 200 MG capsule Take 1 capsule (200 mg total) by mouth 3 (three) times daily as needed for cough. (Patient not taking: Reported on 08/28/2015) 20 capsule 0 Not Taking at Unknown time  . cyclobenzaprine (FLEXERIL) 10 MG tablet Take 1 tablet (10 mg total) by mouth 3 (three) times daily as needed for muscle spasms. 30 tablet 0   . fluconazole (DIFLUCAN) 100 MG tablet Take 1 tablet (100 mg total) by mouth daily. (Patient not taking: Reported on 09/23/2015) 3 tablet 0 Completed Course at Unknown time  . folic acid (FOLVITE) 1 MG tablet Take 1 tablet (1 mg total) by mouth daily. 30 tablet 0 09/22/2015 at Unknown time  . ibuprofen (ADVIL,MOTRIN) 800 MG tablet Take  1 tablet (800 mg total) by mouth 4 (four) times daily. 30 tablet 0 09/22/2015 at Unknown time  . naproxen (NAPROSYN) 500 MG tablet Take 1 po BID with food prn pain 30 tablet 0   . oxyCODONE-acetaminophen (PERCOCET) 10-325 MG per tablet Take 1 tablet by mouth every 4 (four) hours as needed for pain. (Patient not taking: Reported on 08/28/2015) 20 tablet 0 Not Taking at Unknown time  . potassium chloride SA (K-DUR,KLOR-CON) 20 MEQ tablet Take 1 tablet (20 mEq total) by mouth 2 (two) times daily. 12 tablet 0     Allergies: Other Past Medical History  Diagnosis Date  . Sickle cell crisis ([place])   . Depression   . Headache(784.0)   . Abortion     05/2012   Past Surgical History  Procedure Laterality Date  . Splenectomy    . Cholecystectomy N/A 11/30/2014    Procedure: LAPAROSCOPIC CHOLECYSTECTOMY SINGLE SITE WITH INTRAOPERATIVE CHOLANGIOGRAM;  Surgeon: Michael Boston, MD;  Location: [facility] ORS;  Service: General;  Laterality: N/A;   Family History  Problem Relation Age of Onset  . Hypertension Mother   . Sickle cell anemia Sister   . Kidney disease Sister     Lupus  . Arthritis Sister   . Sickle cell anemia Sister   . Sickle cell trait Sister   . Heart disease Maternal Aunt   . Heart disease Maternal Uncle    Social History   Social History  . Marital Status: Single    Spouse Name: N/A  . Number of Children: N/A  .  Years of Education: N/A   Occupational History  . Not on file.   Social History Main Topics  . Smoking status: Never Smoker   . Smokeless tobacco: Never Used  . Alcohol Use: No  . Drug Use: No  . Sexual Activity: Yes   Other Topics Concern  . Not on file   Social History Narrative    Review of Systems: Constitutional: positive for fatigue Eyes: negative Ears, nose, mouth, throat, and face: negative Respiratory: negative for dyspnea on exertion Cardiovascular: negative for chest pain, dyspnea, fatigue, near-syncope, orthopnea, palpitations and  tachypnea Gastrointestinal: negative for abdominal pain and constipation Genitourinary:negative Integument/breast: negative Hematologic/lymphatic: negative Musculoskeletal:positive for myalgias Neurological: negative Behavioral/Psych: negative Endocrine: negative Allergic/Immunologic: negative  Physical Exam: Blood pressure 116/71, pulse 91, temperature 98.4 F (36.9 C), resp. rate 20, height 5\' 3"  (1.6 m), weight 180 lb (81.647 kg), last menstrual period 09/01/2015, SpO2 100 %. BP 113/73 mmHg  Pulse 88  Temp(Src) 98.4 F (36.9 C)  Resp 10  Ht 5\' 3"  (1.6 m)  Wt 180 lb (81.647 kg)  BMI 31.89 kg/m2  SpO2 100%  LMP 09/01/2015  General Appearance:    Alert, cooperative,  mild distress, tearful,  Appears older than stated age  Head:    Normocephalic, without obvious abnormality, atraumatic  Eyes:    PERRL, conjunctiva/corneas clear, EOM's intact, fundi    benign, both eyes  Ears:    Normal TM's and external ear canals, both ears  Nose:   Nares normal, septum midline, mucosa normal, no drainage    or sinus tenderness  Throat:   Lips, mucosa, and tongue normal; teeth and gums normal  Neck:   Supple, symmetrical, trachea midline, no adenopathy;    thyroid:  no enlargement/tenderness/nodules; no carotid   bruit or JVD  Back:     Symmetric, no curvature, ROM normal, no CVA tenderness  Lungs:     Clear to auscultation bilaterally, respirations unlabored  Chest Wall:    No tenderness or deformity   Heart:    Regular rate and rhythm, S1 and S2 normal, no murmur, rub   or gallop  Abdomen:     Soft, non-tender, bowel sounds active all four quadrants,    no masses, no organomegaly  Extremities:   Extremities normal, atraumatic, no cyanosis or edema  Pulses:   2+ and symmetric all extremities  Skin:   Skin color, texture, turgor normal, no rashes or lesions  Lymph nodes:   Cervical, supraclavicular, and axillary nodes normal  Neurologic:   CNII-XII intact, normal strength, sensation and  reflexes    throughout    Lab results: Results for orders placed or performed during the hospital encounter of 09/23/15 (from the past 24 hour(s))  Comprehensive metabolic panel     Status: Abnormal   Collection Time: 09/23/15  2:20 AM  Result Value Ref Range   Sodium 137 135 - 145 mmol/L   Potassium 3.1 (L) 3.5 - 5.1 mmol/L   Chloride 105 101 - 111 mmol/L   CO2 24 22 - 32 mmol/L   Glucose, Bld 86 65 - 99 mg/dL   BUN 7 6 - 20 mg/dL   Creatinine, Ser 0.57 0.44 - 1.00 mg/dL   Calcium 9.6 8.9 - 10.3 mg/dL   Total Protein 7.7 6.5 - 8.1 g/dL   Albumin 4.0 3.5 - 5.0 g/dL   AST 22 15 - 41 U/L   ALT 17 14 - 54 U/L   Alkaline Phosphatase 59 38 - 126 U/L  Total Bilirubin 1.5 (H) 0.3 - 1.2 mg/dL   GFR calc non Af Amer >60 >60 mL/min   GFR calc Af Amer >60 >60 mL/min   Anion gap 8 5 - 15  CBC with Differential     Status: Abnormal   Collection Time: 09/23/15  2:20 AM  Result Value Ref Range   WBC 10.8 (H) 4.0 - 10.5 K/uL   RBC 2.97 (L) 3.87 - 5.11 MIL/uL   Hemoglobin 8.8 (L) 12.0 - 15.0 g/dL   HCT 25.2 (L) 36.0 - 46.0 %   MCV 84.8 78.0 - 100.0 fL   MCH 29.6 26.0 - 34.0 pg   MCHC 34.9 30.0 - 36.0 g/dL   RDW 16.6 (H) 11.5 - 15.5 %   Platelets 804 (H) 150 - 400 K/uL   Neutrophils Relative % 41 %   Neutro Abs 4.4 1.7 - 7.7 K/uL   Lymphocytes Relative 47 %   Lymphs Abs 5.1 (H) 0.7 - 4.0 K/uL   Monocytes Relative 11 %   Monocytes Absolute 1.2 (H) 0.1 - 1.0 K/uL   Eosinophils Relative 1 %   Eosinophils Absolute 0.1 0.0 - 0.7 K/uL   Basophils Relative 0 %   Basophils Absolute 0.0 0.0 - 0.1 K/uL  Reticulocytes     Status: Abnormal   Collection Time: 09/23/15  2:20 AM  Result Value Ref Range   Retic Ct Pct 6.8 (H) 0.4 - 3.1 %   RBC. 3.03 (L) 3.87 - 5.11 MIL/uL   Retic Count, Manual 206.0 (H) 19.0 - 186.0 K/uL    Imaging results:  Dg Chest 2 View  09/23/2015  CLINICAL DATA:  Initial evaluation for productive cough since 09/20/2015. EXAM: CHEST  2 VIEW COMPARISON:  Prior study of  09/08/2015. FINDINGS: The cardiac and mediastinal silhouettes are stable in size and contour, and remain within normal limits. The lungs are normally inflated. No airspace consolidation, pleural effusion, or pulmonary edema is identified. There is no pneumothorax. No acute osseous abnormality identified. IMPRESSION: No active cardiopulmonary disease. Electronically Signed   By: Jeannine Boga M.D.   On: 09/23/2015 04:25     Assessment & Plan:  Patient will be admitted to the day infusion center for extended observation  Start IV D5.45 for cellular rehydration at 125/hr  Start Toradol 30 mg IV every 6 hours for inflammation times 1.   Start Dilaudid PCA High Concentration per weight based protocol.   Patient will be re-evaluated for pain intensity in the context of function and relationship to baseline as care progresses.  If no significant pain relief, will transfer patient to inpatient services for a higher level of care.   Reviewed labs, potassium is 3.1. Will re-check potassium level prior to discharge.   Hollis,Lachina M 09/23/2015, 9:27 AM

## 2015-09-23 NOTE — ED Notes (Signed)
Bed: [id number] Expected date:  Expected time:  Means of arrival:  Comments: EMS 24 yo female sickle cell pain

## 2015-09-23 NOTE — ED Notes (Signed)
Spoke with Gwen from the [facility].  They are accepting pt after her discharge.  Pt will be wheeled to [facility].

## 2015-09-23 NOTE — ED Provider Notes (Signed)
CSN: [id number]     Arrival date & time 09/23/15  0132 History  By signing my name below, I, Carmen [name], attest that this documentation has been prepared under the direction and in the presence of Rolland Porter, MD at [facility]. Electronically Signed: Judithann Sauger, ED Scribe. 09/23/2015. 3:55 AM.     Chief Complaint  Patient presents with  . Sickle Cell Pain Crisis   The history is provided by the patient. No language interpreter was used.   HPI Comments: Carmen [name] is a 24 y.o. female with a hx of SS sickle cell disease and crisis who presents to the Emergency Department complaining of gradually worsening bialteral shoulder pain that radiates down to her ankles onset last night. She reports associated mild productive cough with white sputum onset 3 days ago. She also reports 10 episodes of diarrhea and abdominal cramp even when the diarrhea is gone onset 3 days ago. Her last diarrheal episode was 3 hours ago. She denies any CP or n/v. She states that she does not normally have diarrhea with her crisis. She states that she took 3 Ibuprofen PTA with no relief. She states she ran out of her pain medicine about 2 weeks ago. Patient was seen in the ED on November 8 and was admitted and discharged on November 13. She states that she had to wait for her insurance paperwork and she would normally have had her pain medication filled the 1st of the month but now has to wait until her appointment tomorrow to get her medication filled. She notes that she would still have to come in to the ER even if she had her pain medication.   PCP: Dr. Alyson Ingles  Past Medical History  Diagnosis Date  . Sickle cell crisis ([place])   . Depression   . Headache(784.0)   . Abortion     05/2012   Past Surgical History  Procedure Laterality Date  . Splenectomy    . Cholecystectomy N/A 11/30/2014    Procedure: LAPAROSCOPIC CHOLECYSTECTOMY SINGLE SITE WITH INTRAOPERATIVE CHOLANGIOGRAM;  Surgeon: Michael Boston, MD;   Location: [facility];  Service: General;  Laterality: N/A;   Family History  Problem Relation Age of Onset  . Hypertension Mother   . Sickle cell anemia Sister   . Kidney disease Sister     Lupus  . Arthritis Sister   . Sickle cell anemia Sister   . Sickle cell trait Sister   . Heart disease Maternal Aunt   . Heart disease Maternal Uncle    Social History  Substance Use Topics  . Smoking status: Never Smoker   . Smokeless tobacco: Never Used  . Alcohol Use: No     OB History    Gravida Para Term Preterm AB TAB SAB Ectopic Multiple Living   1    1          Review of Systems  Constitutional: Negative for fever and chills.  Respiratory: Positive for cough.   Gastrointestinal: Positive for diarrhea. Negative for nausea and vomiting.  Musculoskeletal: Positive for arthralgias.  All other systems reviewed and are negative.     Allergies  Other  Home Medications   Prior to Admission medications   Medication Sig Start Date End Date Taking? Authorizing Provider  folic acid (FOLVITE) 1 MG tablet Take 1 tablet (1 mg total) by mouth daily. 09/14/15  Yes Elwyn Reach, MD  ibuprofen (ADVIL,MOTRIN) 800 MG tablet Take 1 tablet (800 mg total) by mouth 4 (four) times daily.  03/16/15  Yes Elwyn Reach, MD  amoxicillin-clavulanate (AUGMENTIN) 875-125 MG tablet Take 1 tablet by mouth every 12 (twelve) hours. 09/14/15   Elwyn Reach, MD  benzonatate (TESSALON) 200 MG capsule Take 1 capsule (200 mg total) by mouth 3 (three) times daily as needed for cough. Patient not taking: Reported on 08/28/2015 07/19/15   Margarita Mail, PA-C  cyclobenzaprine (FLEXERIL) 10 MG tablet Take 1 tablet (10 mg total) by mouth 3 (three) times daily as needed for muscle spasms. 09/23/15   Rolland Porter, MD  fluconazole (DIFLUCAN) 100 MG tablet Take 1 tablet (100 mg total) by mouth daily. Patient not taking: Reported on 09/23/2015 09/14/15   Elwyn Reach, MD  naproxen (NAPROSYN) 500 MG tablet Take 1 po  BID with food prn pain 09/23/15   Rolland Porter, MD  oxyCODONE-acetaminophen (PERCOCET) 10-325 MG per tablet Take 1 tablet by mouth every 4 (four) hours as needed for pain. Patient not taking: Reported on 08/28/2015 07/19/15   Margarita Mail, PA-C  potassium chloride SA (K-DUR,KLOR-CON) 20 MEQ tablet Take 1 tablet (20 mEq total) by mouth 2 (two) times daily. 09/23/15   Rolland Porter, MD   BP 119/74 mmHg  Pulse 96  Temp(Src) 98.7 F (37.1 C) (Oral)  Resp 16  Ht 5\' 3"  (1.6 m)  Wt 180 lb (81.647 kg)  BMI 31.89 kg/m2  SpO2 100%  LMP 09/01/2015  Vital signs normal   Physical Exam  Constitutional: She is oriented to person, place, and time. She appears well-developed and well-nourished.  Non-toxic appearance. She does not appear ill. No distress.  HENT:  Head: Normocephalic and atraumatic.  Right Ear: External ear normal.  Left Ear: External ear normal.  Nose: Nose normal. No mucosal edema or rhinorrhea.  Mouth/Throat: Oropharynx is clear and moist and mucous membranes are normal. No dental abscesses or uvula swelling.  Eyes: Conjunctivae and EOM are normal. Pupils are equal, round, and reactive to light. No scleral icterus.  Neck: Normal range of motion and full passive range of motion without pain. Neck supple.  Cardiovascular: Normal rate, regular rhythm and normal heart sounds.  Exam reveals no gallop and no friction rub.   No murmur heard. Pulmonary/Chest: Effort normal and breath sounds normal. No respiratory distress. She has no wheezes. She has no rhonchi. She has no rales. She exhibits no tenderness and no crepitus.  Abdominal: Soft. Normal appearance and bowel sounds are normal. She exhibits no distension. There is no tenderness. There is no rebound and no guarding.  Musculoskeletal: Normal range of motion. She exhibits no edema or tenderness.  Moves all extremities well.   Neurological: She is alert and oriented to person, place, and time. She has normal strength. No cranial nerve  deficit.  Skin: Skin is warm, dry and intact. No rash noted. No erythema. No pallor.  Psychiatric: She has a normal mood and affect. Her speech is normal and behavior is normal. Her mood appears not anxious.  Nursing note and vitals reviewed.   ED Course  Procedures (including critical care time)  Medications  sodium chloride 0.9 % bolus 1,000 mL (not administered)  ketorolac (TORADOL) 30 MG/ML injection 30 mg (not administered)  HYDROmorphone (DILAUDID) injection 2 mg (2 mg Intravenous Given 09/23/15 0420)  ondansetron (ZOFRAN) injection 4 mg (4 mg Intravenous Given 09/23/15 0420)  sodium chloride 0.9 % bolus 1,000 mL (1,000 mLs Intravenous New Bag/Given 09/23/15 0419)  loperamide (IMODIUM) capsule 4 mg (4 mg Oral Given 09/23/15 0420)  potassium chloride SA (K-DUR,KLOR-CON)  CR tablet 40 mEq (40 mEq Oral Given 09/23/15 0524)  oxyCODONE-acetaminophen (PERCOCET/ROXICET) 5-325 MG per tablet 1 tablet (1 tablet Oral Given 09/23/15 [id number])    DIAGNOSTIC STUDIES: Oxygen Saturation is 100% on RA, normal by my interpretation.    COORDINATION OF CARE: 3:55 AM- Pt advised of plan for treatment and pt agrees. Will receive IV fluids and nausea and pain medication.   6 AM patient complain of pain. She was given oral Percocet. At this point I'm beginning to wonder if her symptoms aren't more narcotic withdrawal rather than sickle cell crisis pain.  08:00 PT was given an oral percocet. She was informed she is going to be discharged, she can go to Dr Noland Fordyce office to discuss getting her pain medications early.   Labs Review Results for orders placed or performed during the hospital encounter of 09/23/15  Comprehensive metabolic panel  Result Value Ref Range   Sodium 137 135 - 145 mmol/L   Potassium 3.1 (L) 3.5 - 5.1 mmol/L   Chloride 105 101 - 111 mmol/L   CO2 24 22 - 32 mmol/L   Glucose, Bld 86 65 - 99 mg/dL   BUN 7 6 - 20 mg/dL   Creatinine, Ser 0.57 0.44 - 1.00 mg/dL   Calcium 9.6 8.9 -  10.3 mg/dL   Total Protein 7.7 6.5 - 8.1 g/dL   Albumin 4.0 3.5 - 5.0 g/dL   AST 22 15 - 41 U/L   ALT 17 14 - 54 U/L   Alkaline Phosphatase 59 38 - 126 U/L   Total Bilirubin 1.5 (H) 0.3 - 1.2 mg/dL   GFR calc non Af Amer >60 >60 mL/min   GFR calc Af Amer >60 >60 mL/min   Anion gap 8 5 - 15  CBC with Differential  Result Value Ref Range   WBC 10.8 (H) 4.0 - 10.5 K/uL   RBC 2.97 (L) 3.87 - 5.11 MIL/uL   Hemoglobin 8.8 (L) 12.0 - 15.0 g/dL   HCT 25.2 (L) 36.0 - 46.0 %   MCV 84.8 78.0 - 100.0 fL   MCH 29.6 26.0 - 34.0 pg   MCHC 34.9 30.0 - 36.0 g/dL   RDW 16.6 (H) 11.5 - 15.5 %   Platelets 804 (H) 150 - 400 K/uL   Neutrophils Relative % 41 %   Neutro Abs 4.4 1.7 - 7.7 K/uL   Lymphocytes Relative 47 %   Lymphs Abs 5.1 (H) 0.7 - 4.0 K/uL   Monocytes Relative 11 %   Monocytes Absolute 1.2 (H) 0.1 - 1.0 K/uL   Eosinophils Relative 1 %   Eosinophils Absolute 0.1 0.0 - 0.7 K/uL   Basophils Relative 0 %   Basophils Absolute 0.0 0.0 - 0.1 K/uL  Reticulocytes  Result Value Ref Range   Retic Ct Pct 6.8 (H) 0.4 - 3.1 %   RBC. 3.03 (L) 3.87 - 5.11 MIL/uL   Retic Count, Manual 206.0 (H) 19.0 - 186.0 K/uL   Laboratory interpretation all normal except stable anemia, expected elevated reticulocyte count, no elevation of the LFTs   Imaging Review Dg Chest 2 View  09/23/2015  CLINICAL DATA:  Initial evaluation for productive cough since 09/20/2015. EXAM: CHEST  2 VIEW COMPARISON:  Prior study of 09/08/2015. FINDINGS: The cardiac and mediastinal silhouettes are stable in size and contour, and remain within normal limits. The lungs are normally inflated. No airspace consolidation, pleural effusion, or pulmonary edema is identified. There is no pneumothorax. No acute osseous abnormality identified. IMPRESSION: No active cardiopulmonary  disease. Electronically Signed   By: Jeannine Boga M.D.   On: 09/23/2015 04:25     Rolland Porter, MD haspersonally reviewed and evaluated these lab results as  part of her medical decision-making.   EKG Interpretation None      MDM   Final diagnoses:  Narcotic withdrawal (HCC)  Diarrhea, unspecified type  Hypokalemia   New Prescriptions   CYCLOBENZAPRINE (FLEXERIL) 10 MG TABLET    Take 1 tablet (10 mg total) by mouth 3 (three) times daily as needed for muscle spasms.   NAPROXEN (NAPROSYN) 500 MG TABLET    Take 1 po BID with food prn pain   POTASSIUM CHLORIDE SA (K-DUR,KLOR-CON) 20 MEQ TABLET    Take 1 tablet (20 mEq total) by mouth 2 (two) times daily.    Plan discharge  Rolland Porter, MD, FACEP   I personally performed the services described in this documentation, which was scribed in my presence. The recorded information has been reviewed and considered.  Rolland Porter, MD, Barbette Or, MD 09/23/15 [phone]

## 2015-09-23 NOTE — Discharge Summary (Signed)
[facility] Discharge Summary   Patient ID: Carmen [name] MRN: [id number] DOB/AGE: 1991/05/02 24 y.o.  Admit date: 09/23/2015 Discharge date: 09/23/2015  Primary Care Physician:  [name], MD  Admission Diagnoses:  Active Problems:   Sickle cell pain crisis [facility])  Discharge Medications:    Medication List    ASK your doctor about these medications        amoxicillin-clavulanate 875-125 MG tablet  Commonly known as:  AUGMENTIN  Take 1 tablet by mouth every 12 (twelve) hours.     benzonatate 200 MG capsule  Commonly known as:  TESSALON  Take 1 capsule (200 mg total) by mouth 3 (three) times daily as needed for cough.     cyclobenzaprine 10 MG tablet  Commonly known as:  FLEXERIL  Take 1 tablet (10 mg total) by mouth 3 (three) times daily as needed for muscle spasms.     fluconazole 100 MG tablet  Commonly known as:  DIFLUCAN  Take 1 tablet (100 mg total) by mouth daily.     folic acid 1 MG tablet  Commonly known as:  FOLVITE  Take 1 tablet (1 mg total) by mouth daily.     ibuprofen 800 MG tablet  Commonly known as:  ADVIL,MOTRIN  Take 1 tablet (800 mg total) by mouth 4 (four) times daily.     naproxen 500 MG tablet  Commonly known as:  NAPROSYN  Take 1 po BID with food prn pain     oxyCODONE-acetaminophen 10-325 MG tablet  Commonly known as:  PERCOCET  Take 1 tablet by mouth every 4 (four) hours as needed for pain.     potassium chloride SA 20 MEQ tablet  Commonly known as:  K-DUR,KLOR-CON  Take 1 tablet (20 mEq total) by mouth 2 (two) times daily.         Consults:  None  Significant Diagnostic Studies:  Dg Chest 2 View  09/23/2015  CLINICAL DATA:  Initial evaluation for productive cough since 09/20/2015. EXAM: CHEST  2 VIEW COMPARISON:  Prior study of 09/08/2015. FINDINGS: The cardiac and mediastinal silhouettes are stable in size and contour, and remain within normal limits. The lungs are normally inflated. No airspace  consolidation, pleural effusion, or pulmonary edema is identified. There is no pneumothorax. No acute osseous abnormality identified. IMPRESSION: No active cardiopulmonary disease. Electronically Signed   By: Jeannine Boga M.D.   On: 09/23/2015 04:25   Dg Chest 2 View  09/08/2015  CLINICAL DATA:  Shortness of breath starting this morning EXAM: CHEST  2 VIEW COMPARISON:  07/19/2015 FINDINGS: Chronic interstitial coarsening at the bases, mild compatible with scar. There is no edema, consolidation, effusion, or pneumothorax. Normal heart size and mediastinal contours. No acute osseous findings. IMPRESSION: Stable exam.  No evidence of acute cardiopulmonary disease. Electronically Signed   By: Monte Fantasia M.D.   On: 09/08/2015 21:13   Ct Maxillofacial W/cm  09/09/2015  CLINICAL DATA:  Right facial swelling and pain. Dental caries. History of sickle cell disease. EXAM: CT MAXILLOFACIAL WITH CONTRAST TECHNIQUE: Multidetector CT imaging of the maxillofacial structures was performed with intravenous contrast. Multiplanar CT image reconstructions were also generated. A small metallic BB was placed on the right temple in order to reliably differentiate right from left. CONTRAST:  59mL OMNIPAQUE IOHEXOL 300 MG/ML  SOLN COMPARISON:  Head CT 05/16/2014 FINDINGS: Visualized intracranial structures are within normal. Orbits are normal. Paranasal sinuses and mastoid air cells are clear. Exam demonstrates a dental carie involving the right  lower first molar tooth with lucency adjacent the roots of this tooth compatible with into periodontal disease. There is minimal stranding of the subcutaneous fat superficial to the body of the right mandible. There is no evidence of focal fluid collection/subperiosteal abscess. There is mild reactive adenopathy over the right submandibular region and cervical chain. Remainder the exam is unremarkable. IMPRESSION: Mild subcutaneous edema superficial to the body of the right  mandible. Evidence of adjacent dental caries and periodontal disease involving the right lower first molar tooth. No evidence of focal fluid collection/subperiosteal abscess. Mild associated reactive adenopathy. Electronically Signed   By: Marin Olp M.D.   On: 09/09/2015 18:14     [facility] Course: Reviewed labs; potassium 3.1, were unable to get additional labs. Patient to follow up in office with Dr. [name] as scheduled on 09/24/2015.  Patient was given D5.45 at 125 ml per hour for cellular rehydration Patient was started on IV dilaudid per weight based protocol, she used a total of 9 mg with 13 demands and  13 deliveries Patient is alert, oriented, and ambulatory, will discharge home in stable condition  Physical Exam at Discharge:  BP 116/84 mmHg  Pulse 68  Temp(Src) 97.6 F (36.4 C) (Oral)  Resp 16  Ht 5\' 3"  (1.6 m)  Wt 180 lb (81.647 kg)  BMI 31.89 kg/m2  SpO2 100%  LMP 09/01/2015   General Appearance:    Alert, cooperative, no distress, appears stated age  Head:    Normocephalic, without obvious abnormality, atraumatic  Eyes:    PERRL, conjunctiva/corneas clear, EOM's intact, fundi    benign, both eyes  Back:     Symmetric, no curvature, ROM normal, no CVA tenderness  Lungs:     Clear to auscultation bilaterally, respirations unlabored  Chest Wall:    No tenderness or deformity   Heart:    Regular rate and rhythm, S1 and S2 normal, no murmur, rub   or gallop  Extremities:   Extremities normal, atraumatic, no cyanosis or edema  Pulses:   2+ and symmetric all extremities  Skin:   Skin color, texture, turgor normal, no rashes or lesions  Lymph nodes:   Cervical, supraclavicular, and axillary nodes normal  Neurologic:   CNII-XII intact, normal strength, sensation and reflexes    throughout   Disposition at Discharge: 95-DC/txfr to another health care institution with planned acute care hosp IP readmit  Discharge Orders:   Condition at  Discharge:   Stable  Time spent on Discharge:  Greater than 30 minutes.  Signed: Gwenith Tschida M 09/23/2015, 3:56 PM

## 2015-09-23 NOTE — ED Notes (Signed)
Pt to xray

## 2015-10-07 ENCOUNTER — Inpatient Hospital Stay (HOSPITAL_COMMUNITY)
Admission: EM | Admit: 2015-10-07 | Discharge: 2015-10-14 | DRG: 811 | Disposition: A | Discharge status: Home / Self Care | Payer: Medicaid Other | Attending: Internal Medicine | Admitting: Internal Medicine

## 2015-10-07 ENCOUNTER — Encounter (HOSPITAL_COMMUNITY): Payer: Self-pay

## 2015-10-07 ENCOUNTER — Inpatient Hospital Stay (HOSPITAL_COMMUNITY): Payer: Medicaid Other

## 2015-10-07 ENCOUNTER — Emergency Department (HOSPITAL_COMMUNITY): Payer: Medicaid Other

## 2015-10-07 DIAGNOSIS — Z79899 Other long term (current) drug therapy: Secondary | ICD-10-CM | POA: Diagnosis not present

## 2015-10-07 DIAGNOSIS — Z841 Family history of disorders of kidney and ureter: Secondary | ICD-10-CM

## 2015-10-07 DIAGNOSIS — Z791 Long term (current) use of non-steroidal anti-inflammatories (NSAID): Secondary | ICD-10-CM | POA: Diagnosis not present

## 2015-10-07 DIAGNOSIS — Z9114 Patient's other noncompliance with medication regimen: Secondary | ICD-10-CM

## 2015-10-07 DIAGNOSIS — D72829 Elevated white blood cell count, unspecified: Secondary | ICD-10-CM | POA: Diagnosis not present

## 2015-10-07 DIAGNOSIS — D5700 Hb-SS disease with crisis, unspecified: Secondary | ICD-10-CM | POA: Diagnosis present

## 2015-10-07 DIAGNOSIS — Z8249 Family history of ischemic heart disease and other diseases of the circulatory system: Secondary | ICD-10-CM

## 2015-10-07 DIAGNOSIS — K5903 Drug induced constipation: Secondary | ICD-10-CM | POA: Diagnosis present

## 2015-10-07 DIAGNOSIS — M25519 Pain in unspecified shoulder: Secondary | ICD-10-CM | POA: Diagnosis present

## 2015-10-07 DIAGNOSIS — J189 Pneumonia, unspecified organism: Secondary | ICD-10-CM | POA: Diagnosis present

## 2015-10-07 DIAGNOSIS — T40605A Adverse effect of unspecified narcotics, initial encounter: Secondary | ICD-10-CM | POA: Diagnosis present

## 2015-10-07 DIAGNOSIS — R112 Nausea with vomiting, unspecified: Secondary | ICD-10-CM | POA: Diagnosis not present

## 2015-10-07 DIAGNOSIS — J181 Lobar pneumonia, unspecified organism: Secondary | ICD-10-CM | POA: Diagnosis not present

## 2015-10-07 DIAGNOSIS — Z832 Family history of diseases of the blood and blood-forming organs and certain disorders involving the immune mechanism: Secondary | ICD-10-CM

## 2015-10-07 DIAGNOSIS — D638 Anemia in other chronic diseases classified elsewhere: Secondary | ICD-10-CM | POA: Diagnosis present

## 2015-10-07 DIAGNOSIS — E876 Hypokalemia: Secondary | ICD-10-CM | POA: Diagnosis not present

## 2015-10-07 DIAGNOSIS — K5909 Other constipation: Secondary | ICD-10-CM | POA: Diagnosis not present

## 2015-10-07 LAB — CBC WITH DIFFERENTIAL/PLATELET
BASOS ABS: 0 10*3/uL (ref 0.0–0.1)
BASOS ABS: 0 10*3/uL (ref 0.0–0.1)
BASOS PCT: 0 %
BASOS PCT: 0 %
EOS ABS: 0 10*3/uL (ref 0.0–0.7)
Eosinophils Absolute: 0.2 10*3/uL (ref 0.0–0.7)
Eosinophils Relative: 1 %
Eosinophils Relative: 0 %
HCT: 24.8 % — ABNORMAL LOW (ref 36.0–46.0)
HEMATOCRIT: 25.6 % — AB (ref 36.0–46.0)
HEMOGLOBIN: 8.7 g/dL — AB (ref 12.0–15.0)
HEMOGLOBIN: 8.4 g/dL — AB (ref 12.0–15.0)
LYMPHS PCT: 26 %
LYMPHS PCT: 17 %
Lymphs Abs: 3.9 10*3/uL (ref 0.7–4.0)
Lymphs Abs: 2.7 10*3/uL (ref 0.7–4.0)
MCH: 29.5 pg (ref 26.0–34.0)
MCH: 29.4 pg (ref 26.0–34.0)
MCHC: 34 g/dL (ref 30.0–36.0)
MCHC: 33.9 g/dL (ref 30.0–36.0)
MCV: 86.8 fL (ref 78.0–100.0)
MCV: 86.7 fL (ref 78.0–100.0)
MONO ABS: 1.3 10*3/uL — AB (ref 0.1–1.0)
MONO ABS: 1.3 10*3/uL — AB (ref 0.1–1.0)
Monocytes Relative: 9 %
Monocytes Relative: 8 %
NEUTROS ABS: 9.4 10*3/uL — AB (ref 1.7–7.7)
NEUTROS PCT: 64 %
NEUTROS PCT: 75 %
Neutro Abs: 11.7 10*3/uL — ABNORMAL HIGH (ref 1.7–7.7)
PLATELETS: 481 10*3/uL — AB (ref 150–400)
Platelets: 659 10*3/uL — ABNORMAL HIGH (ref 150–400)
RBC: 2.95 MIL/uL — ABNORMAL LOW (ref 3.87–5.11)
RBC: 2.86 MIL/uL — AB (ref 3.87–5.11)
RDW: 17.6 % — AB (ref 11.5–15.5)
RDW: 17.9 % — ABNORMAL HIGH (ref 11.5–15.5)
WBC: 14.7 10*3/uL — ABNORMAL HIGH (ref 4.0–10.5)
WBC: 15.7 10*3/uL — AB (ref 4.0–10.5)

## 2015-10-07 LAB — COMPREHENSIVE METABOLIC PANEL
ALBUMIN: 3.9 g/dL (ref 3.5–5.0)
ALBUMIN: 3.9 g/dL (ref 3.5–5.0)
ALK PHOS: 67 U/L (ref 38–126)
ALT: 23 U/L (ref 14–54)
ALT: 20 U/L (ref 14–54)
ANION GAP: 8 (ref 5–15)
ANION GAP: 7 (ref 5–15)
AST: 18 U/L (ref 15–41)
AST: 18 U/L (ref 15–41)
Alkaline Phosphatase: 63 U/L (ref 38–126)
BUN: 6 mg/dL (ref 6–20)
CALCIUM: 9.2 mg/dL (ref 8.9–10.3)
CHLORIDE: 106 mmol/L (ref 101–111)
CO2: 28 mmol/L (ref 22–32)
CO2: 26 mmol/L (ref 22–32)
Calcium: 9 mg/dL (ref 8.9–10.3)
Chloride: 105 mmol/L (ref 101–111)
Creatinine, Ser: 0.54 mg/dL (ref 0.44–1.00)
Creatinine, Ser: 0.47 mg/dL (ref 0.44–1.00)
GFR calc non Af Amer: >60 mL/min (ref >60)
GFR calc non Af Amer: >60 mL/min (ref >60)
GLUCOSE: 86 mg/dL (ref 65–99)
Glucose, Bld: 93 mg/dL (ref 65–99)
POTASSIUM: 3.4 mmol/L — AB (ref 3.5–5.1)
POTASSIUM: 3.4 mmol/L — AB (ref 3.5–5.1)
SODIUM: 141 mmol/L (ref 135–145)
SODIUM: 139 mmol/L (ref 135–145)
TOTAL PROTEIN: 7.6 g/dL (ref 6.5–8.1)
TOTAL PROTEIN: 7.4 g/dL (ref 6.5–8.1)
Total Bilirubin: 1 mg/dL (ref 0.3–1.2)
Total Bilirubin: 1.3 mg/dL — ABNORMAL HIGH (ref 0.3–1.2)

## 2015-10-07 LAB — LACTATE DEHYDROGENASE: LDH: 167 U/L (ref 98–192)

## 2015-10-07 LAB — RETICULOCYTES
RBC.: 2.92 MIL/uL — AB (ref 3.87–5.11)
RBC.: 2.86 MIL/uL — AB (ref 3.87–5.11)
RETIC COUNT ABSOLUTE: 362.1 10*3/uL — AB (ref 19.0–186.0)
RETIC COUNT ABSOLUTE: 351.8 10*3/uL — AB (ref 19.0–186.0)
RETIC CT PCT: 12.3 % — AB (ref 0.4–3.1)
Retic Ct Pct: 12.4 % — ABNORMAL HIGH (ref 0.4–3.1)

## 2015-10-07 LAB — MAGNESIUM: Magnesium: 1.8 mg/dL (ref 1.7–2.4)

## 2015-10-07 MED ORDER — HYDROMORPHONE HCL 1 MG/ML IJ SOLN
1.0000 mg | INTRAMUSCULAR | Status: AC
  Administered 2015-10-07: 1 mg via INTRAVENOUS
  Filled 2015-10-07 (×2): qty 1

## 2015-10-07 MED ORDER — OXYCODONE-ACETAMINOPHEN 10-325 MG PO TABS: 1.0000 | ORAL_TABLET | ORAL | Status: DC | PRN

## 2015-10-07 MED ORDER — SODIUM CHLORIDE 0.9 % IV BOLUS (SEPSIS)
1000.0000 mL | Freq: Once | INTRAVENOUS | Status: AC
  Administered 2015-10-07: 1000 mL via INTRAVENOUS

## 2015-10-07 MED ORDER — PROMETHAZINE HCL 25 MG PO TABS
12.5000 mg | ORAL_TABLET | ORAL | Status: DC | PRN
Start: 2015-10-07 — End: 2015-10-08
  Administered 2015-10-08: 25 mg via ORAL
  Filled 2015-10-07: qty 1

## 2015-10-07 MED ORDER — POLYETHYLENE GLYCOL 3350 17 G PO PACK
17.0000 g | PACK | Freq: Every day | ORAL | Status: DC | PRN
  Administered 2015-10-09 – 2015-10-10 (×2): 17 g via ORAL
  Filled 2015-10-07 (×3): qty 1

## 2015-10-07 MED ORDER — OXYCODONE HCL 5 MG PO TABS
5.0000 mg | ORAL_TABLET | ORAL | Status: DC | PRN
  Administered 2015-10-07: 5 mg via ORAL
  Filled 2015-10-07: qty 1

## 2015-10-07 MED ORDER — SODIUM CHLORIDE 0.9 % IJ SOLN: 9.0000 mL | INTRAMUSCULAR | Status: DC | PRN

## 2015-10-07 MED ORDER — POTASSIUM CHLORIDE CRYS ER 20 MEQ PO TBCR
40.0000 meq | EXTENDED_RELEASE_TABLET | Freq: Once | ORAL | Status: AC
  Administered 2015-10-08: 40 meq via ORAL
  Filled 2015-10-07: qty 2

## 2015-10-07 MED ORDER — ONDANSETRON HCL 4 MG/2ML IJ SOLN: 4.0000 mg | Freq: Four times a day (QID) | INTRAMUSCULAR | Status: DC | PRN

## 2015-10-07 MED ORDER — ENSURE ENLIVE PO LIQD
237.0000 mL | Freq: Two times a day (BID) | ORAL | Status: DC
  Administered 2015-10-08 – 2015-10-14 (×10): 237 mL via ORAL

## 2015-10-07 MED ORDER — PROMETHAZINE HCL 12.5 MG RE SUPP
12.5000 mg | RECTAL | Status: DC | PRN
Start: 2015-10-07 — End: 2015-10-08
  Filled 2015-10-07: qty 2

## 2015-10-07 MED ORDER — OXYCODONE-ACETAMINOPHEN 5-325 MG PO TABS
1.0000 | ORAL_TABLET | ORAL | Status: DC | PRN
  Administered 2015-10-07: 1 via ORAL
  Filled 2015-10-07: qty 1

## 2015-10-07 MED ORDER — DEXTROSE-NACL 5-0.45 % IV SOLN
INTRAVENOUS | Status: DC
  Administered 2015-10-07 – 2015-10-13 (×7): via INTRAVENOUS

## 2015-10-07 MED ORDER — NALOXONE HCL 0.4 MG/ML IJ SOLN: 0.4000 mg | INTRAMUSCULAR | Status: DC | PRN

## 2015-10-07 MED ORDER — DIPHENHYDRAMINE HCL 12.5 MG/5ML PO ELIX: 12.5000 mg | ORAL_SOLUTION | Freq: Four times a day (QID) | ORAL | Status: DC | PRN

## 2015-10-07 MED ORDER — HYDROMORPHONE 1 MG/ML IV SOLN
INTRAVENOUS | Status: DC
  Administered 2015-10-08: 2.1 mg via INTRAVENOUS
  Administered 2015-10-08: via INTRAVENOUS
  Administered 2015-10-08: 0 mg via INTRAVENOUS
  Filled 2015-10-07: qty 25

## 2015-10-07 MED ORDER — DEXTROSE 5 % IV SOLN
1.0000 g | Freq: Once | INTRAVENOUS | Status: DC
  Filled 2015-10-07: qty 10

## 2015-10-07 MED ORDER — IBUPROFEN 800 MG PO TABS
800.0000 mg | ORAL_TABLET | Freq: Four times a day (QID) | ORAL | Status: DC
  Administered 2015-10-07 – 2015-10-08 (×2): 800 mg via ORAL
  Filled 2015-10-07 (×2): qty 1

## 2015-10-07 MED ORDER — SODIUM CHLORIDE 0.9 % IJ SOLN
10.0000 mL | INTRAMUSCULAR | Status: DC | PRN
  Administered 2015-10-08: 10 mL
  Filled 2015-10-07: qty 40

## 2015-10-07 MED ORDER — FOLIC ACID 1 MG PO TABS: 1.0000 mg | ORAL_TABLET | Freq: Every day | ORAL | Status: DC

## 2015-10-07 MED ORDER — DEXTROSE 5 % IV SOLN: 500.0000 mg | Freq: Once | INTRAVENOUS | Status: DC

## 2015-10-07 MED ORDER — LEVOFLOXACIN 750 MG PO TABS
750.0000 mg | ORAL_TABLET | Freq: Every day | ORAL | Status: DC
  Administered 2015-10-08 – 2015-10-13 (×7): 750 mg via ORAL
  Filled 2015-10-07 (×7): qty 1

## 2015-10-07 MED ORDER — FOLIC ACID 1 MG PO TABS
1.0000 mg | ORAL_TABLET | Freq: Every day | ORAL | Status: DC
  Administered 2015-10-08 – 2015-10-14 (×8): 1 mg via ORAL
  Filled 2015-10-07 (×8): qty 1

## 2015-10-07 MED ORDER — NAPROXEN 500 MG PO TABS
500.0000 mg | ORAL_TABLET | Freq: Two times a day (BID) | ORAL | Status: DC
  Filled 2015-10-07: qty 1

## 2015-10-07 MED ORDER — DIPHENHYDRAMINE HCL 50 MG/ML IJ SOLN: 12.5000 mg | Freq: Four times a day (QID) | INTRAMUSCULAR | Status: DC | PRN

## 2015-10-07 MED ORDER — HYDROMORPHONE HCL 1 MG/ML IJ SOLN
INTRAMUSCULAR | Status: AC
  Filled 2015-10-07: qty 1

## 2015-10-07 MED ORDER — SENNOSIDES-DOCUSATE SODIUM 8.6-50 MG PO TABS
1.0000 | ORAL_TABLET | Freq: Two times a day (BID) | ORAL | Status: DC
  Administered 2015-10-08 – 2015-10-14 (×13): 1 via ORAL
  Filled 2015-10-07 (×14): qty 1

## 2015-10-07 NOTE — ED Notes (Signed)
Pt can go up at 17:47, Carmen [name]

## 2015-10-07 NOTE — Progress Notes (Signed)
Pt arrived on the floor without IV access due to limited vein. IV antibiotics were not started. IV team consulted to try PIV access. Dr. Charlies Silvers aware.

## 2015-10-07 NOTE — Progress Notes (Signed)
Peripherally Inserted Central Catheter/Midline Placement  The IV Nurse has discussed with the patient and/or persons authorized to consent for the patient, the purpose of this procedure and the potential benefits and risks involved with this procedure.  The benefits include less needle sticks, lab draws from the catheter and patient may be discharged home with the catheter.  Risks include, but not limited to, infection, bleeding, blood clot (thrombus formation), and puncture of an artery; nerve damage and irregular heat beat.  Alternatives to this procedure were also discussed.  PICC/Midline Placement Documentation        Genene Kilman, Nicolette Bang 10/07/2015, 10:23 PM

## 2015-10-07 NOTE — Progress Notes (Signed)
ANTIBIOTIC CONSULT NOTE - INITIAL  Pharmacy Consult for Levaquin (PO) Indication: pneumonia  No Known Allergies  Patient Measurements: Weight: 180 lb (81.647 kg)  Vital Signs: Temp: 101.2 F (38.4 C) (12/06 1819) Temp Source: Oral (12/06 1819) BP: 111/71 mmHg (12/06 1819) Pulse Rate: 91 (12/06 1819) Intake/Output from previous day:   Intake/Output from this shift:    Labs:  Recent Labs  10/07/15 1518  WBC 14.7*  HGB 8.7*  PLT 659*  CREATININE 0.54   Estimated Creatinine Clearance: 111.6 mL/min (by C-G formula based on Cr of 0.54). No results for input(s): VANCOTROUGH, VANCOPEAK, VANCORANDOM, GENTTROUGH, GENTPEAK, GENTRANDOM, TOBRATROUGH, TOBRAPEAK, TOBRARND, AMIKACINPEAK, AMIKACINTROU, AMIKACIN in the last 72 hours.   Microbiology: No results found for this or any previous visit (from the past 720 hour(s)).  Medical History: Past Medical History  Diagnosis Date  . Sickle cell crisis ([place])   . Headache(784.0)   . Abortion     05/2012    Assessment:  24 yr female with h/o sickle cell presents with pain.  Chest xray shows left upper lobe infiltrate  Upon admission, pharmacy consulted to dose oral Levofloxacin for treatment of CAP   Goal of Therapy:  Eradication of therapy  Plan:  Levaquin 750mg  PO q24h Given pt's CrCl ~ 111 ml/min, dosage will remain stable and need for further dosage adjustment appears unlikely at present.    Will sign off at this time.  Please reconsult if a change in clinical status warrants re-evaluation of dosage.  Azir Muzyka, Toribio Harbour, PharmD 10/07/2015,6:29 PM

## 2015-10-07 NOTE — ED Notes (Signed)
I ATTEMPTED TWICE TO COLLECT LABS AND WAS UNSUCCESSFUL.

## 2015-10-07 NOTE — ED Notes (Addendum)
Pt here with generalized pain.  Sickle cell.  Here 2 weeks ago and given scripts.  States did not have her insurance card to get med filled.  Went yesterday to get filled and told insurance not covering.  Pt ambulatory to ambulance.  Vitals 128/90, hr 102, resp 20

## 2015-10-07 NOTE — ED Provider Notes (Signed)
CSN: [id number]     Arrival date & time 10/07/15  1323 History   First MD Initiated Contact with Patient 10/07/15 1435     Chief Complaint  Patient presents with  . Sickle Cell Pain Crisis     (Consider location/radiation/quality/duration/timing/severity/associated sxs/prior Treatment) HPI Carmen [name] is a 24 y.o. female with history of sickle cell comes in for evaluation for acute pain crisis. Patient reports current pain crisis has been ongoing for the past 2 weeks. She reports she just received her insurance card, needs to make an appointment with her PCP, Dr. Noah Delaine for refill of her pain medications at home. She reports her typical distribution of sickle cell pain in her bilateral shoulders and upper back. She denies any fever, unusual cough, chest pain or shortness of breath. Nothing makes her problem better or worse. No other modifying factors.  Past Medical History  Diagnosis Date  . Sickle cell crisis ([place])   . Headache(784.0)   . Abortion     05/2012   Past Surgical History  Procedure Laterality Date  . Splenectomy    . Cholecystectomy N/A 11/30/2014    Procedure: LAPAROSCOPIC CHOLECYSTECTOMY SINGLE SITE WITH INTRAOPERATIVE CHOLANGIOGRAM;  Surgeon: Michael Boston, MD;  Location: [facility];  Service: General;  Laterality: N/A;   Family History  Problem Relation Age of Onset  . Hypertension Mother   . Sickle cell anemia Sister   . Kidney disease Sister     Lupus  . Arthritis Sister   . Sickle cell anemia Sister   . Sickle cell trait Sister   . Heart disease Maternal Aunt   . Heart disease Maternal Uncle    Social History  Substance Use Topics  . Smoking status: Never Smoker   . Smokeless tobacco: Never Used  . Alcohol Use: No   OB History    Gravida Para Term Preterm AB TAB SAB Ectopic Multiple Living   1    1          Review of Systems A 10 point review of systems was completed and was negative except for pertinent positives and negatives as mentioned in the  history of present illness     Allergies  Review of patient's allergies indicates no known allergies.  Home Medications   Prior to Admission medications   Medication Sig Start Date End Date Taking? Authorizing Provider  folic acid (FOLVITE) 1 MG tablet Take 1 tablet (1 mg total) by mouth daily. 09/14/15  Yes Elwyn Reach, MD  ibuprofen (ADVIL,MOTRIN) 800 MG tablet Take 1 tablet (800 mg total) by mouth 4 (four) times daily. 03/16/15  Yes Elwyn Reach, MD  naproxen (NAPROSYN) 500 MG tablet Take 1 po BID with food prn pain 09/23/15  Yes Rolland Porter, MD  oxyCODONE-acetaminophen (PERCOCET) 10-325 MG per tablet Take 1 tablet by mouth every 4 (four) hours as needed for pain. 07/19/15  Yes Margarita Mail, PA-C  amoxicillin-clavulanate (AUGMENTIN) 875-125 MG tablet Take 1 tablet by mouth every 12 (twelve) hours. Patient not taking: Reported on 10/07/2015 09/14/15   Elwyn Reach, MD  benzonatate (TESSALON) 200 MG capsule Take 1 capsule (200 mg total) by mouth 3 (three) times daily as needed for cough. Patient not taking: Reported on 08/28/2015 07/19/15   Margarita Mail, PA-C  cyclobenzaprine (FLEXERIL) 10 MG tablet Take 1 tablet (10 mg total) by mouth 3 (three) times daily as needed for muscle spasms. Patient not taking: Reported on 10/07/2015 09/23/15   Rolland Porter, MD  fluconazole (DIFLUCAN)  100 MG tablet Take 1 tablet (100 mg total) by mouth daily. Patient not taking: Reported on 09/23/2015 09/14/15   Elwyn Reach, MD  potassium chloride SA (K-DUR,KLOR-CON) 20 MEQ tablet Take 1 tablet (20 mEq total) by mouth 2 (two) times daily. Patient not taking: Reported on 10/07/2015 09/23/15   Rolland Porter, MD   BP 129/86 mmHg  Pulse 71  Temp(Src) 98.3 F (36.8 C) (Oral)  Resp 14  Ht 5\' 3"  (1.6 m)  Wt 81.647 kg  BMI 31.89 kg/m2  SpO2 100%  LMP 10/07/2015 Physical Exam  Constitutional: She is oriented to person, place, and time. She appears well-developed and well-nourished.  HENT:  Head:  Normocephalic and atraumatic.  Mouth/Throat: Oropharynx is clear and moist.  Eyes: Conjunctivae are normal. Pupils are equal, round, and reactive to light. Right eye exhibits no discharge. Left eye exhibits no discharge. No scleral icterus.  Neck: Neck supple.  Cardiovascular: Normal rate, regular rhythm and normal heart sounds.   Pulmonary/Chest: Effort normal and breath sounds normal. No respiratory distress. She has no wheezes. She has no rales.  Abdominal: Soft. There is no tenderness.  Musculoskeletal: She exhibits no tenderness.  Neurological: She is alert and oriented to person, place, and time.  Cranial Nerves II-XII grossly intact  Skin: Skin is warm and dry. No rash noted.  Psychiatric: She has a normal mood and affect.  Nursing note and vitals reviewed.   ED Course  Procedures (including critical care time) Labs Review Labs Reviewed  COMPREHENSIVE METABOLIC PANEL - Abnormal; Notable for the following:    Potassium 3.4 (*)    All other components within normal limits  CBC WITH DIFFERENTIAL/PLATELET - Abnormal; Notable for the following:    WBC 14.7 (*)    RBC 2.95 (*)    Hemoglobin 8.7 (*)    HCT 25.6 (*)    RDW 17.6 (*)    Platelets 659 (*)    Neutro Abs 9.4 (*)    Monocytes Absolute 1.3 (*)    All other components within normal limits  RETICULOCYTES - Abnormal; Notable for the following:    Retic Ct Pct 12.4 (*)    RBC. 2.92 (*)    Retic Count, Manual 362.1 (*)    All other components within normal limits  COMPREHENSIVE METABOLIC PANEL - Abnormal; Notable for the following:    Potassium 3.4 (*)    BUN <5 (*)    Total Bilirubin 1.3 (*)    All other components within normal limits  CBC WITH DIFFERENTIAL/PLATELET - Abnormal; Notable for the following:    WBC 15.7 (*)    RBC 2.86 (*)    Hemoglobin 8.4 (*)    HCT 24.8 (*)    RDW 17.9 (*)    Platelets 481 (*)    Neutro Abs 11.7 (*)    Monocytes Absolute 1.3 (*)    All other components within normal limits   RETICULOCYTES - Abnormal; Notable for the following:    Retic Ct Pct 12.3 (*)    RBC. 2.86 (*)    Retic Count, Manual 351.8 (*)    All other components within normal limits  CULTURE, BLOOD (ROUTINE X 2)  CULTURE, BLOOD (ROUTINE X 2)  CULTURE, EXPECTORATED SPUTUM-ASSESSMENT  LACTATE DEHYDROGENASE  MAGNESIUM  LEGIONELLA PNEUMOPHILA SEROGP 1 UR AG  STREP PNEUMONIAE URINARY ANTIGEN  LEGIONELLA ANTIGEN, URINE    Imaging Review Dg Chest 2 View  10/07/2015  CLINICAL DATA:  Shortness of breath, productive cough EXAM: CHEST  2 VIEW COMPARISON:  09/23/2015 FINDINGS: Cardiomediastinal silhouette is stable. No pulmonary edema. Mild perihilar bronchitic changes. There is streaky infiltrate in left upper lobe best seen on lateral view. IMPRESSION: No pulmonary edema. Mild perihilar bronchitic changes. Streaky infiltrate in left upper lobe best seen on lateral view. Electronically Signed   By: Lahoma Crocker M.D.   On: 10/07/2015 16:22   Dg Chest Port 1 View  10/07/2015  CLINICAL DATA:  Right PICC placement.  Initial encounter. EXAM: PORTABLE CHEST 1 VIEW COMPARISON:  Chest radiograph performed earlier today at 4:12 p.m. FINDINGS: The patient's right PICC is noted ending about the distal SVC. Mild vascular congestion is noted, with mild right basilar atelectasis. No pleural effusion or pneumothorax is seen. The cardiomediastinal silhouette is borderline normal in size. No acute osseous abnormalities are identified. IMPRESSION: 1. Right PICC noted ending about the distal SVC. 2. Mild vascular congestion, with mild right basilar atelectasis. Electronically Signed   By: Garald Balding M.D.   On: 10/07/2015 23:51   I have personally reviewed and evaluated these images and lab results as part of my medical decision-making.   EKG Interpretation None     Meds given in ED:  Medications  HYDROmorphone (DILAUDID) injection 1 mg (1 mg Intravenous Given [id number])  folic acid (FOLVITE) tablet 1 mg (0 mg Oral  Duplicate [id number])  senna-docusate (Senokot-S) tablet 1 tablet (1 tablet Oral Given 10/08/15 0004)  polyethylene glycol (MIRALAX / GLYCOLAX) packet 17 g (not administered)  dextrose 5 %-0.45 % sodium chloride infusion ( Intravenous New Bag/Given 10/07/15 2344)  levofloxacin (LEVAQUIN) tablet 750 mg (750 mg Oral Given 10/08/15 0003)  feeding supplement (ENSURE ENLIVE) (ENSURE ENLIVE) liquid 237 mL (not administered)  sodium chloride 0.9 % injection 10-40 mL (10 mLs Intracatheter Given 10/08/15 0437)  naloxone (NARCAN) injection 0.4 mg (not administered)    And  sodium chloride 0.9 % injection 9 mL (not administered)  ondansetron (ZOFRAN) injection 4 mg (not administered)  HYDROmorphone (DILAUDID) 1 mg/mL PCA injection (not administered)  ketorolac (TORADOL) 30 MG/ML injection 30 mg (not administered)  sodium chloride 0.9 % bolus 1,000 mL (1,000 mLs Intravenous New Bag/Given 10/07/15 1534)  potassium chloride SA (K-DUR,KLOR-CON) CR tablet 40 mEq (40 mEq Oral Given 10/08/15 0003)    Current Discharge Medication List     Filed Vitals:   10/07/15 2140 10/08/15 0222 10/08/15 0400 10/08/15 0559  BP: 120/68 122/81  129/86  Pulse: 90 67  71  Temp: 101.6 F (38.7 C) 98.5 F (36.9 C)  98.3 F (36.8 C)  TempSrc: Oral Oral  Oral  Resp: 18 21 20 14   Height:      Weight:      SpO2: 99% 99% 99% 100%    MDM  Carmen [name] is a 24 y.o. female with history of sickle cell comes in for acute pain crisis. Patient reports typical distribution of pain and bilateral shoulders and lower back. She does report an associated intermittent cough that is productive with clear phlegm, but also states this is normal for her. She denies any fevers, chills, other cardiopulmonary complaints. However, her chest x-ray shows a new infiltrate. Started on empiric antibiotics in the ED.Marland Kitchen Pain is also not controlled in the ED. Patient will require medical admission. Dr. Charlies Silvers to admit Final diagnoses:  Sickle cell anemia  with pain [facility])        Comer Locket, PA-C 10/08/15 [place], MD 10/08/15 1027

## 2015-10-07 NOTE — H&P (Signed)
Patient ID: Carmen [name], female   DOB: 20-Jul-1991, 24 y.o.   MRN: [id number] [facility] History and Physical  Carmen [name] [id number] DOB: [birth year] DOA: 10/07/2015  Referring physician: ER PA: Comer Locket  PCP: [name], MD  Chief Complaint: Sickle cell pain crisis, cough, fever  HPI:  24 year old female with past medical history sickle cell disease, related sickle cell anemia. Patient presented to [facility] with worsening generalized pain, mostly in shoulders and back area for past couple of days to 2 weeks in duration. Patient report pain being 10 out of 10 in intensity, sharp, constant. Patient reports taking at home analgesia which did not provide significant symptomatic relief. She did however mention that she just recently got her insurance card and 80 to follow-up with primary care physician to get refills on her medications. She reports having ongoing cough, mucousy, some shortness of breath with exertion. She also reports having fevers at home but not sure what the highest temperature was. Patient reports she was taking Tessalon cough drops with no significant improvement in cough. No reports of chest pain or palpitations. No abdominal pain, nausea or vomiting. No reports of blood in the stool or urine. No diarrhea or constipation. No lightheadedness or loss of consciousness.  In ED, vital signs were stable except for fever of 101.39F. Blood work demonstrated leukocytosis of 14.7, hemoglobin 8.7, platelets 659, potassium 3.4. Chest x-ray demonstrated no pulmonary edema but that there were infiltrates in the left upper lobe. She was started on empiric Levaquin for possible pneumonia while awaiting blood culture results, Streptococcus and Legionella results.  Assessment & Plan    Active Problems:   Sickle cell pain crisis ([place]) - Resume home analgesics - Started Dilaudid PCA. The problem is she has very poor IV access and we placed the order for PICC  line insertion, hopefully will be able to have it done today. - Continue folic acid - Continue IV fluids for hydration    Lobar pneumonia, unspecified organism / leukocytosis - Possible pneumonia. Chest x-ray on the admission demonstrated infiltrates in the left upper lung lobe. Because she spiked fever and had leukocytosis we started empiric Levaquin. - Pneumonia order set placed. - Follow-up blood culture results, Legionella and strep pneumonia. Follow-up respiratory culture results. - Follow-up CBC tomorrow morning.    Hypokalemia - Supplemented  DVT prophylaxis:  - SCDs bilaterally  Radiological Exams on Admission: Dg Chest 2 View  10/07/2015  CLINICAL DATA:  Shortness of breath, productive cough EXAM: CHEST  2 VIEW COMPARISON:  09/23/2015 FINDINGS: Cardiomediastinal silhouette is stable. No pulmonary edema. Mild perihilar bronchitic changes. There is streaky infiltrate in left upper lobe best seen on lateral view. IMPRESSION: No pulmonary edema. Mild perihilar bronchitic changes. Streaky infiltrate in left upper lobe best seen on lateral view. Electronically Signed   By: Lahoma Crocker M.D.   On: 10/07/2015 16:22    Code Status: Full Family Communication: Plan of care discussed with the patient  Disposition Plan: Admit for further evaluation  Leisa Lenz, MD  [facility] Pager [phone]  Time spent in minutes: 75 minutes  Review of Systems:  Constitutional: Negative for fever, chills and malaise/fatigue. Negative for diaphoresis.  HENT: Negative for hearing loss, ear pain, nosebleeds, congestion, sore throat, neck pain, tinnitus and ear discharge.   Eyes: Negative for blurred vision, double vision, photophobia, pain, discharge and redness.  Respiratory: Negative for cough, hemoptysis, sputum production, shortness of breath, wheezing and stridor.   Cardiovascular: Negative for chest  pain, palpitations, orthopnea, claudication and leg swelling.  Gastrointestinal: Negative  for nausea, vomiting and abdominal pain. Negative for heartburn, constipation, blood in stool and melena.  Genitourinary: Negative for dysuria, urgency, frequency, hematuria and flank pain.  Musculoskeletal: Per HPI Skin: Negative for itching and rash.  Neurological: Negative for dizziness and weakness. Negative for tingling, tremors, sensory change, speech change, focal weakness, loss of consciousness and headaches.  Endo/Heme/Allergies: Negative for environmental allergies and polydipsia. Does not bruise/bleed easily.  Psychiatric/Behavioral: Negative for suicidal ideas. The patient is not nervous/anxious.      Past Medical History  Diagnosis Date  . Sickle cell crisis ([place])   . Headache(784.0)   . Abortion     05/2012   Past Surgical History  Procedure Laterality Date  . Splenectomy    . Cholecystectomy N/A 11/30/2014    Procedure: LAPAROSCOPIC CHOLECYSTECTOMY SINGLE SITE WITH INTRAOPERATIVE CHOLANGIOGRAM;  Surgeon: Michael Boston, MD;  Location: [facility];  Service: General;  Laterality: N/A;   Social History:  reports that she has never smoked. She has never used smokeless tobacco. She reports that she does not drink alcohol or use illicit drugs.  No Known Allergies  Family History:  Family History  Problem Relation Age of Onset  . Hypertension Mother   . Sickle cell anemia Sister   . Kidney disease Sister     Lupus  . Arthritis Sister   . Sickle cell anemia Sister   . Sickle cell trait Sister   . Heart disease Maternal Aunt   . Heart disease Maternal Uncle      Prior to Admission medications   Medication Sig Start Date End Date Taking? Authorizing Provider  folic acid (FOLVITE) 1 MG tablet Take 1 tablet (1 mg total) by mouth daily. 09/14/15  Yes Elwyn Reach, MD  ibuprofen (ADVIL,MOTRIN) 800 MG tablet Take 1 tablet (800 mg total) by mouth 4 (four) times daily. 03/16/15  Yes Elwyn Reach, MD  naproxen (NAPROSYN) 500 MG tablet Take 1 po BID with food prn pain 09/23/15   Yes Rolland Porter, MD  oxyCODONE-acetaminophen (PERCOCET) 10-325 MG per tablet Take 1 tablet by mouth every 4 (four) hours as needed for pain. 07/19/15  Yes Margarita Mail, PA-C   Physical Exam: Filed Vitals:   10/07/15 1328 10/07/15 1455  BP: 135/90 127/92  Pulse: 91 78  Temp: 98.6 F (37 C)   TempSrc: Oral   Resp: 18 12  Weight:  81.647 kg (180 lb)  SpO2: 99% 100%    Physical Exam  Constitutional: Appears well-developed and well-nourished. No distress.  HENT: Normocephalic. No tonsillar erythema or exudates Eyes: Conjunctivae are normal. No scleral icterus.  Neck: Normal ROM. Neck supple. No JVD. No tracheal deviation. No thyromegaly.  CVS: RRR, S1/S2 +, no murmurs, no gallops, no carotid bruit.  Pulmonary: Effort and breath sounds normal, no stridor, rhonchi, wheezes, rales.  Abdominal: Soft. BS +,  no distension, tenderness, rebound or guarding.  Musculoskeletal: Normal range of motion. No edema and no tenderness.  Lymphadenopathy: No lymphadenopathy noted, cervical, inguinal. Neuro: Alert. Normal reflexes, muscle tone coordination. No focal neurologic deficits. Skin: Skin is warm and dry. No rash noted.  No erythema. No pallor.  Psychiatric: Normal mood and affect. Behavior, judgment, thought content normal.   Labs on Admission:  Basic Metabolic Panel:  Recent Labs Lab 10/07/15 1518  NA 141  K 3.4*  CL 105  CO2 28  GLUCOSE 86  BUN 6  CREATININE 0.54  CALCIUM 9.2   Liver  Function Tests:  Recent Labs Lab 10/07/15 1518  AST 18  ALT 23  ALKPHOS 63  BILITOT 1.0  PROT 7.6  ALBUMIN 3.9   No results for input(s): LIPASE, AMYLASE in the last 168 hours. No results for input(s): AMMONIA in the last 168 hours. CBC:  Recent Labs Lab 10/07/15 1518  WBC 14.7*  NEUTROABS 9.4*  HGB 8.7*  HCT 25.6*  MCV 86.8  PLT 659*   Cardiac Enzymes: No results for input(s): CKTOTAL, CKMB, CKMBINDEX, TROPONINI in the last 168 hours. BNP: Invalid input(s): POCBNP CBG: No  results for input(s): GLUCAP in the last 168 hours.  If 7PM-7AM, please contact night-coverage [email or web address] Password [email or web address] 10/07/2015, 5:18 PM

## 2015-10-08 DIAGNOSIS — D638 Anemia in other chronic diseases classified elsewhere: Secondary | ICD-10-CM

## 2015-10-08 DIAGNOSIS — D5700 Hb-SS disease with crisis, unspecified: Principal | ICD-10-CM

## 2015-10-08 DIAGNOSIS — J189 Pneumonia, unspecified organism: Secondary | ICD-10-CM

## 2015-10-08 LAB — STREP PNEUMONIAE URINARY ANTIGEN: Strep Pneumo Urinary Antigen: NEGATIVE

## 2015-10-08 LAB — LEGIONELLA PNEUMOPHILA SEROGP 1 UR AG

## 2015-10-08 MED ORDER — ONDANSETRON HCL 4 MG/2ML IJ SOLN
4.0000 mg | Freq: Four times a day (QID) | INTRAMUSCULAR | Status: DC | PRN
  Administered 2015-10-08 – 2015-10-13 (×6): 4 mg via INTRAVENOUS
  Filled 2015-10-08 (×6): qty 2

## 2015-10-08 MED ORDER — NALOXONE HCL 0.4 MG/ML IJ SOLN: 0.4000 mg | INTRAMUSCULAR | Status: DC | PRN

## 2015-10-08 MED ORDER — HYDROMORPHONE 1 MG/ML IV SOLN
INTRAVENOUS | Status: DC
  Administered 2015-10-08: 4 mg via INTRAVENOUS
  Administered 2015-10-08: 3.5 mg via INTRAVENOUS
  Administered 2015-10-08: 3.9 mg via INTRAVENOUS
  Administered 2015-10-09: via INTRAVENOUS
  Administered 2015-10-09: 5.5 mg via INTRAVENOUS
  Administered 2015-10-09: 4.5 mg via INTRAVENOUS
  Administered 2015-10-09: 3.5 mg via INTRAVENOUS
  Administered 2015-10-09: 4.5 mg via INTRAVENOUS
  Administered 2015-10-09: 7 mg via INTRAVENOUS
  Administered 2015-10-10: 5 mg via INTRAVENOUS
  Administered 2015-10-10: 6 mg via INTRAVENOUS
  Administered 2015-10-10: 4.5 mg via INTRAVENOUS
  Administered 2015-10-10: 6.94 mg via INTRAVENOUS
  Administered 2015-10-10: 6 mg via INTRAVENOUS
  Administered 2015-10-10: 12:00:00 via INTRAVENOUS
  Administered 2015-10-10 – 2015-10-11 (×2): 6.5 mg via INTRAVENOUS
  Administered 2015-10-11: 7 mg via INTRAVENOUS
  Administered 2015-10-11: 3.5 mg via INTRAVENOUS
  Administered 2015-10-11: 08:00:00 via INTRAVENOUS
  Administered 2015-10-11: 4.5 mg via INTRAVENOUS
  Administered 2015-10-11: 6.5 mg via INTRAVENOUS
  Administered 2015-10-12: 4.5 mg via INTRAVENOUS
  Administered 2015-10-12: 7 mg via INTRAVENOUS
  Administered 2015-10-12: 4.5 mg via INTRAVENOUS
  Administered 2015-10-12: 8.5 mg via INTRAVENOUS
  Administered 2015-10-12: 7 mg via INTRAVENOUS
  Administered 2015-10-12: 9 mg via INTRAVENOUS
  Administered 2015-10-12: via INTRAVENOUS
  Administered 2015-10-13: 9.5 mg via INTRAVENOUS
  Administered 2015-10-13: 03:00:00 via INTRAVENOUS
  Filled 2015-10-08 (×7): qty 25

## 2015-10-08 MED ORDER — KETOROLAC TROMETHAMINE 30 MG/ML IJ SOLN
30.0000 mg | Freq: Four times a day (QID) | INTRAMUSCULAR | Status: AC
  Administered 2015-10-08 – 2015-10-13 (×20): 30 mg via INTRAVENOUS
  Filled 2015-10-08 (×20): qty 1

## 2015-10-08 MED ORDER — SODIUM CHLORIDE 0.9 % IJ SOLN: 9.0000 mL | INTRAMUSCULAR | Status: DC | PRN

## 2015-10-08 NOTE — Progress Notes (Signed)
[facility] PROGRESS NOTE  Carmen [name] [id number] DOB: [birth year] DOA: 10/07/2015 PCP: [name], MD  Assessment/Plan: Active Problems:   Sickle cell pain crisis ([place])  1. Hb SS with crisis: Pt still in considerable pain. Reports that she understood from her communication with the nurse that she shouldn't push PCA until the dosing was changed. Nurse of course refuted this. She has used only 5.2 mg with 18/16:demands/deliveries. The PCA dose has been adjusted to a weight based dose and Toradol has been added. Continue IVF and re-assess pain control in the morning.  2. CAP: Pt had a fever and findings on CXR that are suspicious for an early pneumonia. On Levaquin Day #2. Continue. 3. Anemia of chronic disease: At baseline Hb. 4. Medication non-compliance: Pt has not been able to be compliant with medications as she had no medical insurance coverage. However she has recently obtained coverage adn will speak with her PMD about appropriate care.  Code Status: Full Code Family Communication: N/A Disposition Plan: Not yet ready for discharge  [place].  Pager [phone]. If 7PM-7AM, please contact night-coverage.  10/08/2015, 10:19 AM  LOS: 1 day   Interim History: Pain 10/10 localized to shoulders  and back.  Consultants:  None  Procedures:  None  Antibiotics:  Levaquin 13/6 >>   Objective: Filed Vitals:   10/07/15 2140 10/08/15 0222 10/08/15 0400 10/08/15 0559  BP: 120/68 122/81  129/86  Pulse: 90 67  71  Temp: 101.6 F (38.7 C) 98.5 F (36.9 C)  98.3 F (36.8 C)  TempSrc: Oral Oral  Oral  Resp: 18 21 20 14   Height:      Weight:      SpO2: 99% 99% 99% 100%   Weight change:   Intake/Output Summary (Last 24 hours) at 10/08/15 1019 Last data filed at 10/08/15 0437  Gross per 24 hour  Intake     10 ml  Output      2 ml  Net      8 ml    General: Alert, awake, oriented x3, inmild distress.  HEENT: [place]/AT PEERL, EOMI, anicteric Neck:  Trachea midline,  no masses, no thyromegal,y no JVD, no carotid bruit OROPHARYNX:  Moist, No exudate/ erythema/lesions.  Heart: Regular rate and rhythm, without murmurs, rubs, gallops, PMI non-displaced, no heaves or thrills on palpation.  Lungs: Clear to auscultation, no wheezing or rhonchi noted. No increased vocal fremitus resonant to percussion  Abdomen: Soft, nontender, nondistended, positive bowel sounds, no masses no hepatosplenomegaly noted..  Neuro: No focal neurological deficits noted cranial nerves II through XII grossly intact. Strength at functional baseline in bilateral upper and lower extremities. Musculoskeletal: No warm swelling or erythema around joints, no spinal tenderness noted. Psychiatric: Patient alert and oriented x3, good insight and cognition, good recent to remote recall.    Data Reviewed: Basic Metabolic Panel:  Recent Labs Lab 10/07/15 1518 10/07/15 1810  NA 141 139  K 3.4* 3.4*  CL 105 106  CO2 28 26  GLUCOSE 86 93  BUN 6 <5*  CREATININE 0.54 0.47  CALCIUM 9.2 9.0  MG  --  1.8   Liver Function Tests:  Recent Labs Lab 10/07/15 1518 10/07/15 1810  AST 18 18  ALT 23 20  ALKPHOS 63 67  BILITOT 1.0 1.3*  PROT 7.6 7.4  ALBUMIN 3.9 3.9   No results for input(s): LIPASE, AMYLASE in the last 168 hours. No results for input(s): AMMONIA in the last 168 hours. CBC:  Recent Labs  Lab 10/07/15 1518 10/07/15 1810  WBC 14.7* 15.7*  NEUTROABS 9.4* 11.7*  HGB 8.7* 8.4*  HCT 25.6* 24.8*  MCV 86.8 86.7  PLT 659* 481*   Cardiac Enzymes: No results for input(s): CKTOTAL, CKMB, CKMBINDEX, TROPONINI in the last 168 hours. BNP (last 3 results) No results for input(s): BNP in the last 8760 hours.  ProBNP (last 3 results) No results for input(s): PROBNP in the last 8760 hours.  CBG: No results for input(s): GLUCAP in the last 168 hours.  No results found for this or any previous visit (from the past 240 hour(s)).   Studies: Dg Chest 2  View  10/07/2015  CLINICAL DATA:  Shortness of breath, productive cough EXAM: CHEST  2 VIEW COMPARISON:  09/23/2015 FINDINGS: Cardiomediastinal silhouette is stable. No pulmonary edema. Mild perihilar bronchitic changes. There is streaky infiltrate in left upper lobe best seen on lateral view. IMPRESSION: No pulmonary edema. Mild perihilar bronchitic changes. Streaky infiltrate in left upper lobe best seen on lateral view. Electronically Signed   By: Lahoma Crocker M.D.   On: 10/07/2015 16:22   Dg Chest 2 View  09/23/2015  CLINICAL DATA:  Initial evaluation for productive cough since 09/20/2015. EXAM: CHEST  2 VIEW COMPARISON:  Prior study of 09/08/2015. FINDINGS: The cardiac and mediastinal silhouettes are stable in size and contour, and remain within normal limits. The lungs are normally inflated. No airspace consolidation, pleural effusion, or pulmonary edema is identified. There is no pneumothorax. No acute osseous abnormality identified. IMPRESSION: No active cardiopulmonary disease. Electronically Signed   By: Jeannine Boga M.D.   On: 09/23/2015 04:25   Dg Chest 2 View  09/08/2015  CLINICAL DATA:  Shortness of breath starting this morning EXAM: CHEST  2 VIEW COMPARISON:  07/19/2015 FINDINGS: Chronic interstitial coarsening at the bases, mild compatible with scar. There is no edema, consolidation, effusion, or pneumothorax. Normal heart size and mediastinal contours. No acute osseous findings. IMPRESSION: Stable exam.  No evidence of acute cardiopulmonary disease. Electronically Signed   By: Monte Fantasia M.D.   On: 09/08/2015 21:13   Ct Maxillofacial W/cm  09/09/2015  CLINICAL DATA:  Right facial swelling and pain. Dental caries. History of sickle cell disease. EXAM: CT MAXILLOFACIAL WITH CONTRAST TECHNIQUE: Multidetector CT imaging of the maxillofacial structures was performed with intravenous contrast. Multiplanar CT image reconstructions were also generated. A small metallic BB was placed on  the right temple in order to reliably differentiate right from left. CONTRAST:  36mL OMNIPAQUE IOHEXOL 300 MG/ML  SOLN COMPARISON:  Head CT 05/16/2014 FINDINGS: Visualized intracranial structures are within normal. Orbits are normal. Paranasal sinuses and mastoid air cells are clear. Exam demonstrates a dental carie involving the right lower first molar tooth with lucency adjacent the roots of this tooth compatible with into periodontal disease. There is minimal stranding of the subcutaneous fat superficial to the body of the right mandible. There is no evidence of focal fluid collection/subperiosteal abscess. There is mild reactive adenopathy over the right submandibular region and cervical chain. Remainder the exam is unremarkable. IMPRESSION: Mild subcutaneous edema superficial to the body of the right mandible. Evidence of adjacent dental caries and periodontal disease involving the right lower first molar tooth. No evidence of focal fluid collection/subperiosteal abscess. Mild associated reactive adenopathy. Electronically Signed   By: Marin Olp M.D.   On: 09/09/2015 18:14   Dg Chest Port 1 View  10/07/2015  CLINICAL DATA:  Right PICC placement.  Initial encounter. EXAM: PORTABLE CHEST 1 VIEW COMPARISON:  Chest radiograph performed earlier today at 4:12 p.m. FINDINGS: The patient's right PICC is noted ending about the distal SVC. Mild vascular congestion is noted, with mild right basilar atelectasis. No pleural effusion or pneumothorax is seen. The cardiomediastinal silhouette is borderline normal in size. No acute osseous abnormalities are identified. IMPRESSION: 1. Right PICC noted ending about the distal SVC. 2. Mild vascular congestion, with mild right basilar atelectasis. Electronically Signed   By: Garald Balding M.D.   On: 10/07/2015 23:51    Scheduled Meds: . feeding supplement (ENSURE ENLIVE)  237 mL Oral BID BM  . folic acid  1 mg Oral Daily  . HYDROmorphone   Intravenous 6 times per day   . ketorolac  30 mg Intravenous 4 times per day  . levofloxacin  750 mg Oral Q2000  . senna-docusate  1 tablet Oral BID   Continuous Infusions: . dextrose 5 % and 0.45% NaCl 75 mL/hr at 10/07/15 2344    Time spent 30 minutes.

## 2015-10-08 NOTE — Progress Notes (Signed)
Initial Nutrition Assessment  DOCUMENTATION CODES:   Obesity unspecified  INTERVENTION:  - Continue Ensure Enlive BID, each supplement provides 350 kcal and 20 grams of protein - RD will continue to monitor for needs  NUTRITION DIAGNOSIS:   Inadequate oral intake related to poor appetite as evidenced by per patient/family report.  GOAL:   Patient will meet greater than or equal to 90% of their needs  MONITOR:   PO intake, Supplement acceptance, Weight trends, Labs, I & O's  REASON FOR ASSESSMENT:   Malnutrition Screening Tool  ASSESSMENT:   24 year old female with past medical history sickle cell disease, related sickle cell anemia. Patient presented to [facility] with worsening generalized pain, mostly in shoulders and back area for past couple of days to 2 weeks in duration. Patient report pain being 10 out of 10 in intensity, sharp, constant. Patient reports taking at home analgesia which did not provide significant symptomatic relief. She did however mention that she just recently got her insurance card and 80 to follow-up with primary care physician to get refills on her medications. She reports having ongoing cough, mucousy, some shortness of breath with exertion. She also reports having fevers at home but not sure what the highest temperature was. Patient reports she was taking Tessalon cough drops with no significant improvement in cough.  Pt seen for MST. BMI indicates obesity. No intakes documented since admission. Pt states she was unable to eat breakfast this AM due to pain and cannot recall the last time she ate. She states she has not had a "full meal" for 2 weeks but that she was able to drink liquids during this time frame. She denies nausea with PO intakes over the past 2 weeks.  Pt is unsure if she has lost weight recently and is unsure of UBW. Chart review indicates stable weight x3 months PTA. No muscle or fat wasting noted during physical assessment.    Ensure Enlive has been ordered BID. RD will continue to monitor for needs and monitor for PO intakes with pain being controlled during hospitalization. Medications reviewed. Labs reviewed; K: 3.4 mmol/L, BUN <5.   Diet Order:  Diet regular Room service appropriate?: Yes; Fluid consistency:: Thin  Skin:  Reviewed, no issues  Last BM:  PTA  Height:   Ht Readings from Last 1 Encounters:  10/07/15 5\' 3"  (1.6 m)    Weight:   Wt Readings from Last 1 Encounters:  10/07/15 180 lb (81.647 kg)    Ideal Body Weight:  52.27 kg (kg)  BMI:  Body mass index is 31.89 kg/(m^2).  Estimated Nutritional Needs:   Kcal:  1650-1850  Protein:  65-75 grams  Fluid:  2-2.2 L/day  EDUCATION NEEDS:   No education needs identified at this time     Jarome Matin, RD, LDN Inpatient Clinical Dietitian Pager # [phone] After hours/weekend pager # [phone]

## 2015-10-09 DIAGNOSIS — R112 Nausea with vomiting, unspecified: Secondary | ICD-10-CM

## 2015-10-09 LAB — CBC WITH DIFFERENTIAL/PLATELET
Basophils Absolute: 0 10*3/uL (ref 0.0–0.1)
Basophils Relative: 0 %
EOS ABS: 0.2 10*3/uL (ref 0.0–0.7)
Eosinophils Relative: 2 %
HEMATOCRIT: 25.1 % — AB (ref 36.0–46.0)
HEMOGLOBIN: 8.6 g/dL — AB (ref 12.0–15.0)
LYMPHS ABS: 3.6 10*3/uL (ref 0.7–4.0)
Lymphocytes Relative: 33 %
MCH: 29.3 pg (ref 26.0–34.0)
MCHC: 34.3 g/dL (ref 30.0–36.0)
MCV: 85.4 fL (ref 78.0–100.0)
Monocytes Absolute: 1.1 10*3/uL — ABNORMAL HIGH (ref 0.1–1.0)
Monocytes Relative: 10 %
NEUTROS ABS: 6 10*3/uL (ref 1.7–7.7)
NEUTROS PCT: 55 %
Platelets: 553 10*3/uL — ABNORMAL HIGH (ref 150–400)
RBC: 2.94 MIL/uL — AB (ref 3.87–5.11)
RDW: 16.7 % — ABNORMAL HIGH (ref 11.5–15.5)
WBC: 10.9 10*3/uL — AB (ref 4.0–10.5)

## 2015-10-09 LAB — BASIC METABOLIC PANEL
Anion gap: 8 (ref 5–15)
CHLORIDE: 99 mmol/L — AB (ref 101–111)
CO2: 31 mmol/L (ref 22–32)
Calcium: 9.5 mg/dL (ref 8.9–10.3)
Creatinine, Ser: 0.39 mg/dL — ABNORMAL LOW (ref 0.44–1.00)
GFR calc non Af Amer: >60 mL/min (ref >60)
Glucose, Bld: 115 mg/dL — ABNORMAL HIGH (ref 65–99)
POTASSIUM: 3.5 mmol/L (ref 3.5–5.1)
SODIUM: 138 mmol/L (ref 135–145)

## 2015-10-09 LAB — LEGIONELLA ANTIGEN, URINE

## 2015-10-09 MED ORDER — HYDROMORPHONE HCL 2 MG/ML IJ SOLN
2.0000 mg | INTRAMUSCULAR | Status: AC
  Administered 2015-10-09 (×3): 2 mg via INTRAVENOUS
  Filled 2015-10-09 (×3): qty 1

## 2015-10-09 NOTE — Progress Notes (Signed)
[place] PROGRESS NOTE  Carmen [name] [id number] DOB: 1991/01/17 DOA: 10/07/2015 PCP: [name], MD  Assessment/Plan: Active Problems:   Sickle cell pain crisis ([place])  1. Hb SS with crisis: Pt still in considerable pain. She has used only 28.5 mg with 87/57:demands/deliveries.  Continue PCA  and Toradol at current dose and add clinician assisted doses tin attempts to decrease pain. Decrease IVF to [facility] 2. CAP: Pt had a fever and findings on CXR that are suspicious for an early pneumonia. On Levaquin Day #3/8. Continue. 3. Emesis: Pt has had 2 episodes of emesis which she describes as greenish. She has no associated abdominal pain and associates emesis with intense pain. Will change to clear liquids and adjust regimen to obtain better pain control.  4. Constipation: Pt has not had a BM since  Monday. Asked nurse to give Miralax that has been ordered PRN.  5. Anemia of chronic disease: At baseline Hb. 6. Medication non-compliance: Pt has not been able to be compliant with medications as she had no medical insurance coverage. However she has recently obtained coverage adn will speak with her PMD about appropriate care.  Code Status: Full Code Family Communication: N/A Disposition Plan: Not yet ready for discharge  [place].  Pager [phone]. If 7PM-7AM, please contact night-coverage.  10/09/2015, 12:11 PM  LOS: 2 days   Interim History: Pain 10/10 localized to shoulders  and back.  Consultants:  None  Procedures:  None  Antibiotics:  Levaquin 13/6 >>   Objective: Filed Vitals:   10/09/15 0440 10/09/15 0600 10/09/15 0738 10/09/15 1056  BP:  117/87  119/82  Pulse:  74  76  Temp:  98.6 F (37 C)  98.5 F (36.9 C)  TempSrc:  Oral  Oral  Resp: 13 14 8 14   Height:      Weight:  186 lb (84.369 kg)    SpO2: 99% 92% 97% 98%   Weight change: 6 lb (2.722 kg)  Intake/Output Summary (Last 24 hours) at 10/09/15 1211 Last data filed at 10/09/15  0212  Gross per 24 hour  Intake    360 ml  Output    800 ml  Net   -440 ml    General: Alert, awake, oriented x3, inmild distress.  HEENT: [place]/AT PEERL, EOMI, anicteric Neck: Trachea midline,  no masses, no thyromegal,y no JVD, no carotid bruit OROPHARYNX:  Moist, No exudate/ erythema/lesions.  Heart: Regular rate and rhythm, without murmurs, rubs, gallops, PMI non-displaced, no heaves or thrills on palpation.  Lungs: Clear to auscultation, no wheezing or rhonchi noted. No increased vocal fremitus resonant to percussion  Abdomen: Soft, nontender, nondistended, positive bowel sounds, no masses no hepatosplenomegaly noted.  Neuro: No focal neurological deficits noted cranial nerves II through XII grossly intact. Strength at functional baseline in bilateral upper and lower extremities. Musculoskeletal: No warm swelling or erythema around joints, no spinal tenderness noted. Psychiatric: Patient alert and oriented x3, good insight and cognition, good recent to remote recall.    Data Reviewed: Basic Metabolic Panel:  Recent Labs Lab 10/07/15 1518 10/07/15 1810 10/09/15 1035  NA 141 139 138  K 3.4* 3.4* 3.5  CL 105 106 99*  CO2 28 26 31   GLUCOSE 86 93 115*  BUN 6 <5* <5*  CREATININE 0.54 0.47 0.39*  CALCIUM 9.2 9.0 9.5  MG  --  1.8  --    Liver Function Tests:  Recent Labs Lab 10/07/15 1518 10/07/15 1810  AST 18 18  ALT 23  20  ALKPHOS 63 67  BILITOT 1.0 1.3*  PROT 7.6 7.4  ALBUMIN 3.9 3.9   No results for input(s): LIPASE, AMYLASE in the last 168 hours. No results for input(s): AMMONIA in the last 168 hours. CBC:  Recent Labs Lab 10/07/15 1518 10/07/15 1810 10/09/15 1035  WBC 14.7* 15.7* 10.9*  NEUTROABS 9.4* 11.7* 6.0  HGB 8.7* 8.4* 8.6*  HCT 25.6* 24.8* 25.1*  MCV 86.8 86.7 85.4  PLT 659* 481* 553*   Cardiac Enzymes: No results for input(s): CKTOTAL, CKMB, CKMBINDEX, TROPONINI in the last 168 hours. BNP (last 3 results) No results for input(s): BNP in  the last 8760 hours.  ProBNP (last 3 results) No results for input(s): PROBNP in the last 8760 hours.  CBG: No results for input(s): GLUCAP in the last 168 hours.  Recent Results (from the past 240 hour(s))  Culture, blood (routine x 2)     Status: None (Preliminary result)   Collection Time: 10/07/15  7:45 PM  Result Value Ref Range Status   Specimen Description BLOOD LEFT ARM  Final   Special Requests IN PEDIATRIC BOTTLE 3CC  Final   Culture   Final    NO GROWTH < 24 HOURS Performed at [facility]    Report Status PENDING  Incomplete  Culture, blood (routine x 2)     Status: None (Preliminary result)   Collection Time: 10/07/15  8:00 PM  Result Value Ref Range Status   Specimen Description BLOOD RIGHT ARM  Final   Special Requests BOTTLES DRAWN AEROBIC ONLY 5CC  Final   Culture   Final    NO GROWTH < 24 HOURS Performed at [facility]    Report Status PENDING  Incomplete     Studies: Dg Chest 2 View  10/07/2015  CLINICAL DATA:  Shortness of breath, productive cough EXAM: CHEST  2 VIEW COMPARISON:  09/23/2015 FINDINGS: Cardiomediastinal silhouette is stable. No pulmonary edema. Mild perihilar bronchitic changes. There is streaky infiltrate in left upper lobe best seen on lateral view. IMPRESSION: No pulmonary edema. Mild perihilar bronchitic changes. Streaky infiltrate in left upper lobe best seen on lateral view. Electronically Signed   By: Lahoma Crocker M.D.   On: 10/07/2015 16:22   Dg Chest 2 View  09/23/2015  CLINICAL DATA:  Initial evaluation for productive cough since 09/20/2015. EXAM: CHEST  2 VIEW COMPARISON:  Prior study of 09/08/2015. FINDINGS: The cardiac and mediastinal silhouettes are stable in size and contour, and remain within normal limits. The lungs are normally inflated. No airspace consolidation, pleural effusion, or pulmonary edema is identified. There is no pneumothorax. No acute osseous abnormality identified. IMPRESSION: No active  cardiopulmonary disease. Electronically Signed   By: Jeannine Boga M.D.   On: 09/23/2015 04:25   Ct Maxillofacial W/cm  09/09/2015  CLINICAL DATA:  Right facial swelling and pain. Dental caries. History of sickle cell disease. EXAM: CT MAXILLOFACIAL WITH CONTRAST TECHNIQUE: Multidetector CT imaging of the maxillofacial structures was performed with intravenous contrast. Multiplanar CT image reconstructions were also generated. A small metallic BB was placed on the right temple in order to reliably differentiate right from left. CONTRAST:  69mL OMNIPAQUE IOHEXOL 300 MG/ML  SOLN COMPARISON:  Head CT 05/16/2014 FINDINGS: Visualized intracranial structures are within normal. Orbits are normal. Paranasal sinuses and mastoid air cells are clear. Exam demonstrates a dental carie involving the right lower first molar tooth with lucency adjacent the roots of this tooth compatible with into periodontal disease. There is minimal  stranding of the subcutaneous fat superficial to the body of the right mandible. There is no evidence of focal fluid collection/subperiosteal abscess. There is mild reactive adenopathy over the right submandibular region and cervical chain. Remainder the exam is unremarkable. IMPRESSION: Mild subcutaneous edema superficial to the body of the right mandible. Evidence of adjacent dental caries and periodontal disease involving the right lower first molar tooth. No evidence of focal fluid collection/subperiosteal abscess. Mild associated reactive adenopathy. Electronically Signed   By: Marin Olp M.D.   On: 09/09/2015 18:14   Dg Chest Port 1 View  10/07/2015  CLINICAL DATA:  Right PICC placement.  Initial encounter. EXAM: PORTABLE CHEST 1 VIEW COMPARISON:  Chest radiograph performed earlier today at 4:12 p.m. FINDINGS: The patient's right PICC is noted ending about the distal SVC. Mild vascular congestion is noted, with mild right basilar atelectasis. No pleural effusion or pneumothorax is  seen. The cardiomediastinal silhouette is borderline normal in size. No acute osseous abnormalities are identified. IMPRESSION: 1. Right PICC noted ending about the distal SVC. 2. Mild vascular congestion, with mild right basilar atelectasis. Electronically Signed   By: Garald Balding M.D.   On: 10/07/2015 23:51    Scheduled Meds: . feeding supplement (ENSURE ENLIVE)  237 mL Oral BID BM  . folic acid  1 mg Oral Daily  . HYDROmorphone   Intravenous 6 times per day  .  HYDROmorphone (DILAUDID) injection  2 mg Intravenous Q2H  . ketorolac  30 mg Intravenous 4 times per day  . levofloxacin  750 mg Oral Q2000  . senna-docusate  1 tablet Oral BID   Continuous Infusions: . dextrose 5 % and 0.45% NaCl 75 mL/hr at 10/09/15 0017    Time spent 25 minutes.

## 2015-10-10 DIAGNOSIS — K5909 Other constipation: Secondary | ICD-10-CM

## 2015-10-10 MED ORDER — HYDROMORPHONE HCL 2 MG/ML IJ SOLN
2.0000 mg | INTRAMUSCULAR | Status: DC | PRN
Start: 2015-10-10 — End: 2015-10-14
  Administered 2015-10-10 – 2015-10-14 (×22): 2 mg via INTRAVENOUS
  Filled 2015-10-10 (×22): qty 1

## 2015-10-10 MED ORDER — MAGNESIUM CITRATE PO SOLN
1.0000 | Freq: Once | ORAL | Status: AC
  Administered 2015-10-10: 1 via ORAL
  Filled 2015-10-10: qty 296

## 2015-10-10 NOTE — Progress Notes (Signed)
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 Hb SS with crisis: Pt reports pain has improved. She has used only 33.84mg  with 6968:demands/deliveries.  Continue PCA  and Toradol at current dose andcontinue clinician assisted doses on a PRN basis.. Decrease IVF to [facility] 2. CAP: Pt had a fever and findings on CXR that are suspicious for an early pneumonia. No further fevers.  On Levaquin Day #4/8. Continue. 3. Emesis: Pt has had no emesis today. She has  Had no associated abdominal pain. Will advance to a regular diet. 4. Constipation: Pt has had no results from Miralax. Will give Magnesium Citrate. 5. Anemia of chronic disease: At baseline Hb. 6. Medication non-compliance: Pt has not been able to be compliant with medications as she had no medical insurance coverage. However she has recently obtained coverage adn will speak with her PMD about appropriate care.  Code Status: Full Code Family Communication: N/A Disposition Plan: Not yet ready for discharge  [place].  Pager [phone]. If 7PM-7AM, please contact night-coverage.  10/10/2015, 3:10 PM  LOS: 3 days   Interim History: Pain 6/10 in shoulders and 8/10 in back. Still no BM  Consultants:  None  Procedures:  None  Antibiotics:  Levaquin 13/6 >>   Objective: Filed Vitals:   10/10/15 0746 10/10/15 1043 10/10/15 1114 10/10/15 1507  BP:  125/82    Pulse:  79    Temp:  97.9 F (36.6 C)    TempSrc:  Oral    Resp: 13 14 13 15   Height:      Weight:      SpO2: 98% 100% 92% 95%   Weight change: 0 lb (0 kg) No intake or output data in the 24 hours ending 10/10/15 1510  General: Alert, awake, oriented x3, in mild distress.  HEENT: [place]/AT PEERL, EOMI, anicteric Neck: Trachea midline,  no masses, no thyromegal,y no JVD, no carotid bruit OROPHARYNX:  Moist, No exudate/  erythema/lesions.  Heart: Regular rate and rhythm, without murmurs, rubs, gallops, PMI non-displaced, no heaves or thrills on palpation.  Lungs: Clear to auscultation, no wheezing or rhonchi noted. No increased vocal fremitus resonant to percussion  Abdomen: Soft, nontender, nondistended, positive bowel sounds, no masses no hepatosplenomegaly noted.  Neuro: No focal neurological deficits noted cranial nerves II through XII grossly intact. Strength at functional baseline in bilateral upper and lower extremities. Musculoskeletal: No warm swelling or erythema around joints, no spinal tenderness noted.    Data Reviewed: Basic Metabolic Panel:  Recent Labs Lab 10/07/15 1518 10/07/15 1810 10/09/15 1035  NA 141 139 138  K 3.4* 3.4* 3.5  CL 105 106 99*  CO2 28 26 31   GLUCOSE 86 93 115*  BUN 6 <5* <5*  CREATININE 0.54 0.47 0.39*  CALCIUM 9.2 9.0 9.5  MG  --  1.8  --    Liver Function Tests:  Recent Labs Lab 10/07/15 1518 10/07/15 1810  AST 18 18  ALT 23 20  ALKPHOS 63 67  BILITOT 1.0 1.3*  PROT 7.6 7.4  ALBUMIN 3.9 3.9   No results for input(s): LIPASE, AMYLASE in the last 168 hours. No results for input(s): AMMONIA in the last 168 hours. CBC:  Recent Labs Lab 10/07/15 1518 10/07/15 1810 10/09/15 1035  WBC 14.7* 15.7* 10.9*  NEUTROABS 9.4* 11.7* 6.0  HGB 8.7* 8.4* 8.6*  HCT 25.6* 24.8* 25.1*  MCV  86.8 86.7 85.4  PLT 659* 481* 553*   Cardiac Enzymes: No results for input(s): CKTOTAL, CKMB, CKMBINDEX, TROPONINI in the last 168 hours. BNP (last 3 results) No results for input(s): BNP in the last 8760 hours.  ProBNP (last 3 results) No results for input(s): PROBNP in the last 8760 hours.  CBG: No results for input(s): GLUCAP in the last 168 hours.  Recent Results (from the past 240 hour(s))  Culture, blood (routine x 2)     Status: None (Preliminary result)   Collection Time: 10/07/15  7:45 PM  Result Value Ref Range Status   Specimen Description BLOOD LEFT  ARM  Final   Special Requests IN PEDIATRIC BOTTLE 3CC  Final   Culture   Final    NO GROWTH 3 DAYS Performed at [facility]    Report Status PENDING  Incomplete  Culture, blood (routine x 2)     Status: None (Preliminary result)   Collection Time: 10/07/15  8:00 PM  Result Value Ref Range Status   Specimen Description BLOOD RIGHT ARM  Final   Special Requests BOTTLES DRAWN AEROBIC ONLY 5CC  Final   Culture   Final    NO GROWTH 3 DAYS Performed at [facility]    Report Status PENDING  Incomplete     Studies: Dg Chest 2 View  10/07/2015  CLINICAL DATA:  Shortness of breath, productive cough EXAM: CHEST  2 VIEW COMPARISON:  09/23/2015 FINDINGS: Cardiomediastinal silhouette is stable. No pulmonary edema. Mild perihilar bronchitic changes. There is streaky infiltrate in left upper lobe best seen on lateral view. IMPRESSION: No pulmonary edema. Mild perihilar bronchitic changes. Streaky infiltrate in left upper lobe best seen on lateral view. Electronically Signed   By: Lahoma Crocker M.D.   On: 10/07/2015 16:22   Dg Chest 2 View  09/23/2015  CLINICAL DATA:  Initial evaluation for productive cough since 09/20/2015. EXAM: CHEST  2 VIEW COMPARISON:  Prior study of 09/08/2015. FINDINGS: The cardiac and mediastinal silhouettes are stable in size and contour, and remain within normal limits. The lungs are normally inflated. No airspace consolidation, pleural effusion, or pulmonary edema is identified. There is no pneumothorax. No acute osseous abnormality identified. IMPRESSION: No active cardiopulmonary disease. Electronically Signed   By: Jeannine Boga M.D.   On: 09/23/2015 04:25   Dg Chest Port 1 View  10/07/2015  CLINICAL DATA:  Right PICC placement.  Initial encounter. EXAM: PORTABLE CHEST 1 VIEW COMPARISON:  Chest radiograph performed earlier today at 4:12 p.m. FINDINGS: The patient's right PICC is noted ending about the distal SVC. Mild vascular congestion is noted, with  mild right basilar atelectasis. No pleural effusion or pneumothorax is seen. The cardiomediastinal silhouette is borderline normal in size. No acute osseous abnormalities are identified. IMPRESSION: 1. Right PICC noted ending about the distal SVC. 2. Mild vascular congestion, with mild right basilar atelectasis. Electronically Signed   By: Garald Balding M.D.   On: 10/07/2015 23:51    Scheduled Meds: . feeding supplement (ENSURE ENLIVE)  237 mL Oral BID BM  . folic acid  1 mg Oral Daily  . HYDROmorphone   Intravenous 6 times per day  . ketorolac  30 mg Intravenous 4 times per day  . levofloxacin  750 mg Oral Q2000  . senna-docusate  1 tablet Oral BID   Continuous Infusions: . dextrose 5 % and 0.45% NaCl 75 mL/hr at 10/10/15 0513    Time spent 20 minutes.

## 2015-10-11 LAB — CBC WITH DIFFERENTIAL/PLATELET
BASOS ABS: 0 10*3/uL (ref 0.0–0.1)
BASOS PCT: 0 %
EOS ABS: 0.3 10*3/uL (ref 0.0–0.7)
EOS PCT: 3 %
HCT: 21.8 % — ABNORMAL LOW (ref 36.0–46.0)
Hemoglobin: 7.6 g/dL — ABNORMAL LOW (ref 12.0–15.0)
Lymphocytes Relative: 37 %
Lymphs Abs: 4.3 10*3/uL — ABNORMAL HIGH (ref 0.7–4.0)
MCH: 29.2 pg (ref 26.0–34.0)
MCHC: 34.9 g/dL (ref 30.0–36.0)
MCV: 83.8 fL (ref 78.0–100.0)
MONO ABS: 1.4 10*3/uL — AB (ref 0.1–1.0)
Monocytes Relative: 12 %
Neutro Abs: 5.5 10*3/uL (ref 1.7–7.7)
Neutrophils Relative %: 48 %
PLATELETS: 431 10*3/uL — AB (ref 150–400)
RBC: 2.6 MIL/uL — AB (ref 3.87–5.11)
RDW: 18.2 % — AB (ref 11.5–15.5)
WBC: 11.6 10*3/uL — AB (ref 4.0–10.5)

## 2015-10-11 LAB — BASIC METABOLIC PANEL
ANION GAP: 6 (ref 5–15)
BUN: <5 mg/dL — ABNORMAL LOW (ref 6–20)
CALCIUM: 8.9 mg/dL (ref 8.9–10.3)
CO2: 30 mmol/L (ref 22–32)
Chloride: 99 mmol/L — ABNORMAL LOW (ref 101–111)
Creatinine, Ser: 0.42 mg/dL — ABNORMAL LOW (ref 0.44–1.00)
GLUCOSE: 303 mg/dL — AB (ref 65–99)
POTASSIUM: 3.6 mmol/L (ref 3.5–5.1)
SODIUM: 135 mmol/L (ref 135–145)

## 2015-10-11 NOTE — Progress Notes (Signed)
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 Hb SS with crisis: Pt reports pain has gotten worse especially high right hip. She has used 32 mg with 69 demands of 64 deliveries in the last 24 hours.  She also has the physician assisted dosing as well as Toradol.  Continue PCA  and Toradol at current dose andcontinue clinician assisted doses on a PRN basis.. Decrease IVF to [facility] 2. CAP: Pt had a fever and findings on CXR that are suspicious for an early pneumonia. No further fevers.  On Levaquin Day #4/8. Continue. 3. Emesis: Pt has had no emesis today. She has  Had no associated abdominal pain. Will advance to a regular diet. 4. Constipation: Pt has had no results from Miralax. Will give Magnesium Citrate. 5. Anemia of chronic disease: At baseline Hb. 6. Medication non-compliance: Pt has not been able to be compliant with medications as she had no medical insurance coverage. However she has recently obtained coverage adn will speak with her PMD about appropriate care.  Code Status: Full Code Family Communication: N/A Disposition Plan: Not yet ready for discharge  [facility]  Pager [phone]. If 7PM-7AM, please contact night-coverage.  10/11/2015, 12:50 PM  LOS: 4 days   Interim History: Pain 6/10 in shoulders and 8/10 in back. Still no BM  Consultants:  None  Procedures:  None  Antibiotics:  Levaquin 13/6 >>   Objective: Filed Vitals:   10/11/15 0438 10/11/15 0726 10/11/15 1035 10/11/15 1228  BP:   123/71   Pulse:   72   Temp:   98.3 F (36.8 C)   TempSrc:   Oral   Resp: 12 14 15 10   Height:      Weight:      SpO2: 92% 97% 98% 96%   Weight change:   Intake/Output Summary (Last 24 hours) at 10/11/15 1250 Last data filed at 10/11/15 0900  Gross per 24 hour  Intake    360 ml  Output      0 ml  Net    360 ml     General: Alert, awake, oriented x3, in mild distress.  HEENT: [place]/AT PEERL, EOMI, anicteric Neck: Trachea midline,  no masses, no thyromegal,y no JVD, no carotid bruit OROPHARYNX:  Moist, No exudate/ erythema/lesions.  Heart: Regular rate and rhythm, without murmurs, rubs, gallops, PMI non-displaced, no heaves or thrills on palpation.  Lungs: Clear to auscultation, no wheezing or rhonchi noted. No increased vocal fremitus resonant to percussion  Abdomen: Soft, nontender, nondistended, positive bowel sounds, no masses no hepatosplenomegaly noted.  Neuro: No focal neurological deficits noted cranial nerves II through XII grossly intact. Strength at functional baseline in bilateral upper and lower extremities. Musculoskeletal: No warm swelling or erythema around joints, no spinal tenderness noted.    Data Reviewed: Basic Metabolic Panel:  Recent Labs Lab 10/07/15 1518 10/07/15 1810 10/09/15 1035 10/11/15 0445  NA 141 139 138 135  K 3.4* 3.4* 3.5 3.6  CL 105 106 99* 99*  CO2 28 26 31 30   GLUCOSE 86 93 115* 303*  BUN 6 <5* <5* <5*  CREATININE 0.54 0.47 0.39* 0.42*  CALCIUM 9.2 9.0 9.5 8.9  MG  --  1.8  --   --    Liver Function Tests:  Recent Labs Lab 10/07/15 1518 10/07/15 1810  AST 18 18  ALT 23 20  ALKPHOS 63 67  BILITOT 1.0 1.3*  PROT 7.6 7.4  ALBUMIN 3.9 3.9   No results for input(s): LIPASE, AMYLASE in the last 168 hours. No results for input(s): AMMONIA in the last 168 hours. CBC:  Recent Labs Lab 10/07/15 1518 10/07/15 1810 10/09/15 1035 10/11/15 0445  WBC 14.7* 15.7* 10.9* 11.6*  NEUTROABS 9.4* 11.7* 6.0 5.5  HGB 8.7* 8.4* 8.6* 7.6*  HCT 25.6* 24.8* 25.1* 21.8*  MCV 86.8 86.7 85.4 83.8  PLT 659* 481* 553* 431*   Cardiac Enzymes: No results for input(s): CKTOTAL, CKMB, CKMBINDEX, TROPONINI in the last 168 hours. BNP (last 3 results) No results for input(s): BNP in the last 8760 hours.  ProBNP (last 3 results) No results for input(s): PROBNP  in the last 8760 hours.  CBG: No results for input(s): GLUCAP in the last 168 hours.  Recent Results (from the past 240 hour(s))  Culture, blood (routine x 2)     Status: None (Preliminary result)   Collection Time: 10/07/15  7:45 PM  Result Value Ref Range Status   Specimen Description BLOOD LEFT ARM  Final   Special Requests IN PEDIATRIC BOTTLE 3CC  Final   Culture   Final    NO GROWTH 4 DAYS Performed at [facility]    Report Status PENDING  Incomplete  Culture, blood (routine x 2)     Status: None (Preliminary result)   Collection Time: 10/07/15  8:00 PM  Result Value Ref Range Status   Specimen Description BLOOD RIGHT ARM  Final   Special Requests BOTTLES DRAWN AEROBIC ONLY 5CC  Final   Culture   Final    NO GROWTH 4 DAYS Performed at [facility]    Report Status PENDING  Incomplete     Studies: Dg Chest 2 View  10/07/2015  CLINICAL DATA:  Shortness of breath, productive cough EXAM: CHEST  2 VIEW COMPARISON:  09/23/2015 FINDINGS: Cardiomediastinal silhouette is stable. No pulmonary edema. Mild perihilar bronchitic changes. There is streaky infiltrate in left upper lobe best seen on lateral view. IMPRESSION: No pulmonary edema. Mild perihilar bronchitic changes. Streaky infiltrate in left upper lobe best seen on lateral view. Electronically Signed   By: Lahoma Crocker M.D.   On: 10/07/2015 16:22   Dg Chest 2 View  09/23/2015  CLINICAL DATA:  Initial evaluation for productive cough since 09/20/2015. EXAM: CHEST  2 VIEW COMPARISON:  Prior study of 09/08/2015. FINDINGS: The cardiac and mediastinal silhouettes are stable in size and contour, and remain within normal limits. The lungs are normally inflated. No airspace consolidation, pleural effusion, or pulmonary edema is identified. There is no pneumothorax. No acute osseous abnormality identified. IMPRESSION: No active cardiopulmonary disease. Electronically Signed   By: Jeannine Boga M.D.   On: 09/23/2015 04:25    Dg Chest Port 1 View  10/07/2015  CLINICAL DATA:  Right PICC placement.  Initial encounter. EXAM: PORTABLE CHEST 1 VIEW COMPARISON:  Chest radiograph performed earlier today at 4:12 p.m. FINDINGS: The patient's right PICC is noted ending about the distal SVC. Mild vascular congestion is noted, with mild right basilar atelectasis. No pleural effusion or pneumothorax is seen. The cardiomediastinal silhouette is borderline normal in size. No acute osseous abnormalities are identified. IMPRESSION: 1. Right PICC noted ending about the distal SVC. 2. Mild vascular congestion, with mild right basilar atelectasis. Electronically Signed   By: Garald Balding M.D.   On: 10/07/2015 23:51    Scheduled Meds: . feeding supplement (ENSURE ENLIVE)  237 mL Oral BID BM  .  folic acid  1 mg Oral Daily  . HYDROmorphone   Intravenous 6 times per day  . ketorolac  30 mg Intravenous 4 times per day  . levofloxacin  750 mg Oral Q2000  . senna-docusate  1 tablet Oral BID   Continuous Infusions: . dextrose 5 % and 0.45% NaCl 75 mL/hr at 10/10/15 1711    Time spent 20 minutes.

## 2015-10-12 LAB — CULTURE, BLOOD (ROUTINE X 2)
CULTURE: NO GROWTH
CULTURE: NO GROWTH

## 2015-10-12 MED ORDER — CYCLOBENZAPRINE HCL 10 MG PO TABS
10.0000 mg | ORAL_TABLET | Freq: Three times a day (TID) | ORAL | Status: DC | PRN
  Administered 2015-10-12 – 2015-10-14 (×6): 10 mg via ORAL
  Filled 2015-10-12 (×7): qty 1

## 2015-10-12 NOTE — Progress Notes (Signed)
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 Hb SS with crisis: Pt reports pain has continue to worsen especially her right hip. She has used 38 mg with 76 demands of 76 deliveries in the last 24 hours.  She also has the physician assisted dosing as well as Toradol.  Continue PCA  and Toradol at current dose and continue decrease clinician assisted doses on a PRN basis. 2. CAP: Pt had a fever and findings on CXR that are suspicious for an early pneumonia. No further fevers.  On Levaquin Day #6/8. Continue. 3. Emesis: Resolved. Advanced to a regular diet. 4. Constipation: Pt has had no results from Miralax. Will give Magnesium Citrate. 5. Anemia of chronic disease: At baseline Hb. 6. Medication non-compliance: Pt has not been able to be compliant with medications as she had no medical insurance coverage. However she has recently obtained coverage adn will speak with her PMD about appropriate care.  Code Status: Full Code Family Communication: N/A Disposition Plan: Not yet ready for discharge  [facility]  Pager [phone]. If 7PM-7AM, please contact night-coverage.  10/12/2015, 6:24 PM  LOS: 5 days   Interim History: Pain 6/10 in shoulders and 8/10 in back. Still no BM  Consultants:  None  Procedures:  None  Antibiotics:  Levaquin 13/6 >>   Objective: Filed Vitals:   10/12/15 1129 10/12/15 1335 10/12/15 1559 10/12/15 1655  BP:  121/74  126/70  Pulse:  76  73  Temp:  98.3 F (36.8 C)  98.4 F (36.9 C)  TempSrc:  Oral  Oral  Resp: 11 16 11 14   Height:      Weight:      SpO2: 96% 97% 99% 98%   Weight change:   Intake/Output Summary (Last 24 hours) at 10/12/15 1824 Last data filed at 10/12/15 1230  Gross per 24 hour  Intake 1846.4 ml  Output      0 ml  Net 1846.4 ml    General: Alert, awake, oriented x3, in mild  distress.  HEENT: [place]/AT PEERL, EOMI, anicteric Neck: Trachea midline,  no masses, no thyromegal,y no JVD, no carotid bruit OROPHARYNX:  Moist, No exudate/ erythema/lesions.  Heart: Regular rate and rhythm, without murmurs, rubs, gallops, PMI non-displaced, no heaves or thrills on palpation.  Lungs: Clear to auscultation, no wheezing or rhonchi noted. No increased vocal fremitus resonant to percussion  Abdomen: Soft, nontender, nondistended, positive bowel sounds, no masses no hepatosplenomegaly noted.  Neuro: No focal neurological deficits noted cranial nerves II through XII grossly intact. Strength at functional baseline in bilateral upper and lower extremities. Musculoskeletal: No warm swelling or erythema around joints, no spinal tenderness noted.    Data Reviewed: Basic Metabolic Panel:  Recent Labs Lab 10/07/15 1518 10/07/15 1810 10/09/15 1035 10/11/15 0445  NA 141 139 138 135  K 3.4* 3.4* 3.5 3.6  CL 105 106 99* 99*  CO2 28 26 31 30   GLUCOSE 86 93 115* 303*  BUN 6 <5* <5* <5*  CREATININE 0.54 0.47 0.39* 0.42*  CALCIUM 9.2 9.0 9.5 8.9  MG  --  1.8  --   --    Liver Function Tests:  Recent Labs Lab 10/07/15 1518 10/07/15 1810  AST 18 18  ALT 23 20  ALKPHOS 63 67  BILITOT 1.0 1.3*  PROT 7.6 7.4  ALBUMIN 3.9 3.9   No results for input(s):  LIPASE, AMYLASE in the last 168 hours. No results for input(s): AMMONIA in the last 168 hours. CBC:  Recent Labs Lab 10/07/15 1518 10/07/15 1810 10/09/15 1035 10/11/15 0445  WBC 14.7* 15.7* 10.9* 11.6*  NEUTROABS 9.4* 11.7* 6.0 5.5  HGB 8.7* 8.4* 8.6* 7.6*  HCT 25.6* 24.8* 25.1* 21.8*  MCV 86.8 86.7 85.4 83.8  PLT 659* 481* 553* 431*   Cardiac Enzymes: No results for input(s): CKTOTAL, CKMB, CKMBINDEX, TROPONINI in the last 168 hours. BNP (last 3 results) No results for input(s): BNP in the last 8760 hours.  ProBNP (last 3 results) No results for input(s): PROBNP in the last 8760 hours.  CBG: No results for  input(s): GLUCAP in the last 168 hours.  Recent Results (from the past 240 hour(s))  Culture, blood (routine x 2)     Status: None   Collection Time: 10/07/15  7:45 PM  Result Value Ref Range Status   Specimen Description BLOOD LEFT ARM  Final   Special Requests IN PEDIATRIC BOTTLE 3CC  Final   Culture   Final    NO GROWTH 5 DAYS Performed at [facility]    Report Status 10/12/2015 FINAL  Final  Culture, blood (routine x 2)     Status: None   Collection Time: 10/07/15  8:00 PM  Result Value Ref Range Status   Specimen Description BLOOD RIGHT ARM  Final   Special Requests BOTTLES DRAWN AEROBIC ONLY 5CC  Final   Culture   Final    NO GROWTH 5 DAYS Performed at [facility]    Report Status 10/12/2015 FINAL  Final     Studies: Dg Chest 2 View  10/07/2015  CLINICAL DATA:  Shortness of breath, productive cough EXAM: CHEST  2 VIEW COMPARISON:  09/23/2015 FINDINGS: Cardiomediastinal silhouette is stable. No pulmonary edema. Mild perihilar bronchitic changes. There is streaky infiltrate in left upper lobe best seen on lateral view. IMPRESSION: No pulmonary edema. Mild perihilar bronchitic changes. Streaky infiltrate in left upper lobe best seen on lateral view. Electronically Signed   By: Lahoma Crocker M.D.   On: 10/07/2015 16:22   Dg Chest 2 View  09/23/2015  CLINICAL DATA:  Initial evaluation for productive cough since 09/20/2015. EXAM: CHEST  2 VIEW COMPARISON:  Prior study of 09/08/2015. FINDINGS: The cardiac and mediastinal silhouettes are stable in size and contour, and remain within normal limits. The lungs are normally inflated. No airspace consolidation, pleural effusion, or pulmonary edema is identified. There is no pneumothorax. No acute osseous abnormality identified. IMPRESSION: No active cardiopulmonary disease. Electronically Signed   By: Jeannine Boga M.D.   On: 09/23/2015 04:25   Dg Chest Port 1 View  10/07/2015  CLINICAL DATA:  Right PICC placement.   Initial encounter. EXAM: PORTABLE CHEST 1 VIEW COMPARISON:  Chest radiograph performed earlier today at 4:12 p.m. FINDINGS: The patient's right PICC is noted ending about the distal SVC. Mild vascular congestion is noted, with mild right basilar atelectasis. No pleural effusion or pneumothorax is seen. The cardiomediastinal silhouette is borderline normal in size. No acute osseous abnormalities are identified. IMPRESSION: 1. Right PICC noted ending about the distal SVC. 2. Mild vascular congestion, with mild right basilar atelectasis. Electronically Signed   By: Garald Balding M.D.   On: 10/07/2015 23:51    Scheduled Meds: . feeding supplement (ENSURE ENLIVE)  237 mL Oral BID BM  . folic acid  1 mg Oral Daily  . HYDROmorphone   Intravenous 6 times per day  .  ketorolac  30 mg Intravenous 4 times per day  . levofloxacin  750 mg Oral Q2000  . senna-docusate  1 tablet Oral BID   Continuous Infusions: . dextrose 5 % and 0.45% NaCl 75 mL/hr at 10/12/15 1134    Time spent 20 minutes.

## 2015-10-13 LAB — CBC WITH DIFFERENTIAL/PLATELET
BASOS ABS: 0 10*3/uL (ref 0.0–0.1)
Basophils Relative: 0 %
EOS PCT: 4 %
Eosinophils Absolute: 0.4 10*3/uL (ref 0.0–0.7)
HEMATOCRIT: 22.3 % — AB (ref 36.0–46.0)
HEMOGLOBIN: 7.6 g/dL — AB (ref 12.0–15.0)
LYMPHS ABS: 4.8 10*3/uL — AB (ref 0.7–4.0)
LYMPHS PCT: 42 %
MCH: 28.4 pg (ref 26.0–34.0)
MCHC: 34.1 g/dL (ref 30.0–36.0)
MCV: 83.2 fL (ref 78.0–100.0)
Monocytes Absolute: 1.5 10*3/uL — ABNORMAL HIGH (ref 0.1–1.0)
Monocytes Relative: 13 %
NEUTROS ABS: 4.7 10*3/uL (ref 1.7–7.7)
Neutrophils Relative %: 41 %
Platelets: 426 10*3/uL — ABNORMAL HIGH (ref 150–400)
RBC: 2.68 MIL/uL — AB (ref 3.87–5.11)
RDW: 18.6 % — ABNORMAL HIGH (ref 11.5–15.5)
WBC: 11.5 10*3/uL — AB (ref 4.0–10.5)

## 2015-10-13 LAB — COMPREHENSIVE METABOLIC PANEL
ALT: 96 U/L — AB (ref 14–54)
AST: 106 U/L — AB (ref 15–41)
Albumin: 3.7 g/dL (ref 3.5–5.0)
Alkaline Phosphatase: 65 U/L (ref 38–126)
Anion gap: 7 (ref 5–15)
BUN: 6 mg/dL (ref 6–20)
CHLORIDE: 97 mmol/L — AB (ref 101–111)
CO2: 29 mmol/L (ref 22–32)
CREATININE: 0.45 mg/dL (ref 0.44–1.00)
Calcium: 9.1 mg/dL (ref 8.9–10.3)
GFR calc Af Amer: >60 mL/min (ref >60)
Glucose, Bld: 103 mg/dL — ABNORMAL HIGH (ref 65–99)
Potassium: 3.6 mmol/L (ref 3.5–5.1)
Sodium: 133 mmol/L — ABNORMAL LOW (ref 135–145)
Total Bilirubin: 2.5 mg/dL — ABNORMAL HIGH (ref 0.3–1.2)
Total Protein: 7.1 g/dL (ref 6.5–8.1)

## 2015-10-13 MED ORDER — ZOLPIDEM TARTRATE 5 MG PO TABS
5.0000 mg | ORAL_TABLET | Freq: Every evening | ORAL | Status: DC | PRN
  Administered 2015-10-14: 5 mg via ORAL
  Filled 2015-10-13: qty 1

## 2015-10-13 MED ORDER — HYDROMORPHONE HCL 4 MG PO TABS
4.0000 mg | ORAL_TABLET | ORAL | Status: DC
  Administered 2015-10-13 – 2015-10-14 (×7): 4 mg via ORAL
  Filled 2015-10-13 (×7): qty 1

## 2015-10-13 NOTE — Progress Notes (Signed)
SICKLE CELL SERVICE PROGRESS NOTE  Carmen [name] [id number] DOB: [birth year] DOA: 10/07/2015 PCP: [name], MD  Assessment/Plan: Active Problems:   Sickle cell pain crisis ([place])  Hb SS with crisis:  Pt has used 44.5 mg with 89/89:demands/deliveries in the last 24 hours. Carmen [name] is able to ambulate without difficulty today. I have discussed plan with Carmen [name] and we will transition to oral dilaudid with continued clinician assisted doses. Continue Flexaril and Toradol. Anticipate discharge home tomorrow. CAP: Pt had a fever and findings on CXR that are suspicious for an early pneumonia. No further fevers.  On Levaquin Day #7/8. Continue. 1. Emesis: Pt has had no emesis today. She has  Had no associated abdominal pain.Continue regular diet. 2. Constipation: Pt has not had a BM in 3 days. Will ask nurse to give Miralax. 3. Anemia of chronic disease: At baseline Hb. 4. Medication non-compliance: Pt has not been able to be compliant with medications as she had no medical insurance coverage. However she has recently obtained coverage adn will speak with her PMD about appropriate care. 5. Psychosocial: Pt became very emotional during the visit stating that she was frustrated that she was not being believed about her level of pain. I explained to Carmen [name] that she was the only person who could articulate how her pain stimulus was being perceived and that evaluation of function and PCA gave information of effectiveness of treatment in the context of function. Nursing unit leadership to round on Carmen [name].  Code Status: Full Code Family Communication: N/A Disposition Plan:Anticipate discharge tomorrow.  MATTHEWS,MICHELLE A.  Pager [phone]. If 7PM-7AM, please contact night-coverage.  10/13/2015, 10:56 AM  LOS: 6 days   Interim History: Pain  8/10 in back but Carmen [name] able to ambulate in hall without assistance and without difficulty . Still no  BM  Consultants:  None  Procedures:  None  Antibiotics:  Levaquin 13/6 >>   Objective: Filed Vitals:   10/13/15 0041 10/13/15 0355 10/13/15 0851 10/13/15 0949  BP:  120/86  137/79  Pulse:  95  80  Temp:  98.6 F (37 C)  98.3 F (36.8 C)  TempSrc:  Oral  Oral  Resp: 15 11 19 13   Height:      Weight:      SpO2: 96% 98%  100%   Weight change:   Intake/Output Summary (Last 24 hours) at 10/13/15 1056 Last data filed at 10/13/15 0610  Gross per 24 hour  Intake 2210.4 ml  Output      0 ml  Net 2210.4 ml    General: Alert, awake, oriented x3, in no apparent distress.  HEENT: [place]/AT PEERL, EOMI, anicteric Neck: Trachea midline,  no masses, no thyromegal,y no JVD, no carotid bruit OROPHARYNX:  Moist, No exudate/ erythema/lesions.  Heart: Regular rate and rhythm, without murmurs, rubs, gallops, PMI non-displaced, no heaves or thrills on palpation.  Lungs: Clear to auscultation, no wheezing or rhonchi noted. No increased vocal fremitus resonant to percussion  Abdomen: Soft, nontender, nondistended, positive bowel sounds, no masses no hepatosplenomegaly noted.  Neuro: No focal neurological deficits noted cranial nerves II through XII grossly intact. Strength at functional baseline in bilateral upper and lower extremities. Musculoskeletal: No warm swelling or erythema around joints, no spinal tenderness noted.    Data Reviewed: Basic Metabolic Panel:  Recent Labs Lab 10/07/15 1518 10/07/15 1810 10/09/15 1035 10/11/15 0445 10/13/15 0618  NA 141 139 138 135 133*  K 3.4* 3.4* 3.5 3.6 3.6  CL 105 106 99*  99* 97*  CO2 28 26 31 30 29   GLUCOSE 86 93 115* 303* 103*  BUN 6 <5* <5* <5* 6  CREATININE 0.54 0.47 0.39* 0.42* 0.45  CALCIUM 9.2 9.0 9.5 8.9 9.1  MG  --  1.8  --   --   --    Liver Function Tests:  Recent Labs Lab 10/07/15 1518 10/07/15 1810 10/13/15 0618  AST 18 18 106*  ALT 23 20 96*  ALKPHOS 63 67 65  BILITOT 1.0 1.3* 2.5*  PROT 7.6 7.4 7.1   ALBUMIN 3.9 3.9 3.7   No results for input(s): LIPASE, AMYLASE in the last 168 hours. No results for input(s): AMMONIA in the last 168 hours. CBC:  Recent Labs Lab 10/07/15 1518 10/07/15 1810 10/09/15 1035 10/11/15 0445 10/13/15 0618  WBC 14.7* 15.7* 10.9* 11.6* 11.5*  NEUTROABS 9.4* 11.7* 6.0 5.5 4.7  HGB 8.7* 8.4* 8.6* 7.6* 7.6*  HCT 25.6* 24.8* 25.1* 21.8* 22.3*  MCV 86.8 86.7 85.4 83.8 83.2  PLT 659* 481* 553* 431* 426*   Cardiac Enzymes: No results for input(s): CKTOTAL, CKMB, CKMBINDEX, TROPONINI in the last 168 hours. BNP (last 3 results) No results for input(s): BNP in the last 8760 hours.  ProBNP (last 3 results) No results for input(s): PROBNP in the last 8760 hours.  CBG: No results for input(s): GLUCAP in the last 168 hours.  Recent Results (from the past 240 hour(s))  Culture, blood (routine x 2)     Status: None   Collection Time: 10/07/15  7:45 PM  Result Value Ref Range Status   Specimen Description BLOOD LEFT ARM  Final   Special Requests IN PEDIATRIC BOTTLE 3CC  Final   Culture   Final    NO GROWTH 5 DAYS Performed at [facility]    Report Status 10/12/2015 FINAL  Final  Culture, blood (routine x 2)     Status: None   Collection Time: 10/07/15  8:00 PM  Result Value Ref Range Status   Specimen Description BLOOD RIGHT ARM  Final   Special Requests BOTTLES DRAWN AEROBIC ONLY 5CC  Final   Culture   Final    NO GROWTH 5 DAYS Performed at [facility]    Report Status 10/12/2015 FINAL  Final     Studies: Dg Chest 2 View  10/07/2015  CLINICAL DATA:  Shortness of breath, productive cough EXAM: CHEST  2 VIEW COMPARISON:  09/23/2015 FINDINGS: Cardiomediastinal silhouette is stable. No pulmonary edema. Mild perihilar bronchitic changes. There is streaky infiltrate in left upper lobe best seen on lateral view. IMPRESSION: No pulmonary edema. Mild perihilar bronchitic changes. Streaky infiltrate in left upper lobe best seen on lateral  view. Electronically Signed   By: Lahoma Crocker M.D.   On: 10/07/2015 16:22   Dg Chest 2 View  09/23/2015  CLINICAL DATA:  Initial evaluation for productive cough since 09/20/2015. EXAM: CHEST  2 VIEW COMPARISON:  Prior study of 09/08/2015. FINDINGS: The cardiac and mediastinal silhouettes are stable in size and contour, and remain within normal limits. The lungs are normally inflated. No airspace consolidation, pleural effusion, or pulmonary edema is identified. There is no pneumothorax. No acute osseous abnormality identified. IMPRESSION: No active cardiopulmonary disease. Electronically Signed   By: Jeannine Boga M.D.   On: 09/23/2015 04:25   Dg Chest Port 1 View  10/07/2015  CLINICAL DATA:  Right PICC placement.  Initial encounter. EXAM: PORTABLE CHEST 1 VIEW COMPARISON:  Chest radiograph performed earlier today at 4:12 p.m.  FINDINGS: The Carmen [name]'s right PICC is noted ending about the distal SVC. Mild vascular congestion is noted, with mild right basilar atelectasis. No pleural effusion or pneumothorax is seen. The cardiomediastinal silhouette is borderline normal in size. No acute osseous abnormalities are identified. IMPRESSION: 1. Right PICC noted ending about the distal SVC. 2. Mild vascular congestion, with mild right basilar atelectasis. Electronically Signed   By: Garald Balding M.D.   On: 10/07/2015 23:51    Scheduled Meds: . feeding supplement (ENSURE ENLIVE)  237 mL Oral BID BM  . folic acid  1 mg Oral Daily  . HYDROmorphone  4 mg Oral Q4H  . levofloxacin  750 mg Oral Q2000  . senna-docusate  1 tablet Oral BID   Continuous Infusions: . dextrose 5 % and 0.45% NaCl 75 mL/hr at 10/13/15 0037    Time spent 45 minutes.

## 2015-10-14 MED ORDER — HYDROMORPHONE HCL 4 MG PO TABS: 4.0000 mg | ORAL_TABLET | ORAL | Status: DC

## 2015-10-14 MED ORDER — CYCLOBENZAPRINE HCL 10 MG PO TABS: 10.0000 mg | ORAL_TABLET | Freq: Three times a day (TID) | ORAL | Status: DC | PRN

## 2015-10-14 MED ORDER — OXYCODONE-ACETAMINOPHEN 10-325 MG PO TABS: 1.0000 | ORAL_TABLET | ORAL | Status: DC | PRN

## 2015-10-14 MED ORDER — MAGNESIUM CITRATE PO SOLN
1.0000 | Freq: Once | ORAL | Status: AC
  Administered 2015-10-14: 1 via ORAL
  Filled 2015-10-14: qty 296

## 2015-10-14 NOTE — Discharge Summary (Addendum)
Carmen [name] MRN: [id number] DOB/AGE: 11-09-1990 24 y.o.  Admit date: 10/07/2015 Discharge date: 10/14/2015  Primary Care Physician:  [name], MD   Discharge Diagnoses:   Patient Active Problem List   Diagnosis Date Noted  . Sickle cell disease with crisis ([place]) 09/09/2015  . Dental caries   . Pain in a tooth or teeth   . Right facial swelling   . HCAP (healthcare-associated pneumonia) 02/28/2015  . Sickle cell crisis ([place]) 02/28/2015  . 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 Abdominal pain   . 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 Acute calculous cholecystitis s/p lap chole 11/30/2014 11/30/2014  . Hypokalemia 11/28/2014  . Atypical chest pain 11/28/2014  . Leukocytosis 11/22/2014  . Thrombocytosis ([place]) 11/22/2014  . Anemia 05/16/2014  . Migraine, unspecified, without mention of intractable migraine without mention of status migrainosus 05/16/2014  . Sickle cell pain crisis ([place]) 11/12/2013    DISCHARGE MEDICATION:   Medication List    STOP taking these medications        amoxicillin-clavulanate 875-125 MG tablet  Commonly known as:  AUGMENTIN     benzonatate 200 MG capsule  Commonly known as:  TESSALON     fluconazole 100 MG tablet  Commonly known as:  DIFLUCAN     ibuprofen 800 MG tablet  Commonly known as:  ADVIL,MOTRIN      TAKE these medications        cyclobenzaprine 10 MG tablet  Commonly known as:  FLEXERIL  Take 1 tablet (10 mg total) by mouth 3 (three) times daily as needed for muscle spasms.     folic acid 1 MG tablet  Commonly known as:  FOLVITE  Take 1 tablet (1 mg total) by mouth daily.     HYDROmorphone 4 MG tablet  Commonly known as:  DILAUDID  Take 1 tablet (4 mg total) by mouth every 4 (four) hours.     naproxen 500 MG tablet  Commonly known as:  NAPROSYN  Take 1 po BID with food prn pain     oxyCODONE-acetaminophen 10-325 MG tablet  Commonly known as:  PERCOCET  Take 1 tablet by  mouth every 4 (four) hours as needed for pain. Hold until Oral Dilaudid completed. Then resume as prescribed.     potassium chloride SA 20 MEQ tablet  Commonly known as:  K-DUR,KLOR-CON  Take 1 tablet (20 mEq total) by mouth 2 (two) times daily.          Consults:     SIGNIFICANT DIAGNOSTIC STUDIES:  Dg Chest 2 View  10/07/2015  CLINICAL DATA:  Shortness of breath, productive cough EXAM: CHEST  2 VIEW COMPARISON:  09/23/2015 FINDINGS: Cardiomediastinal silhouette is stable. No pulmonary edema. Mild perihilar bronchitic changes. There is streaky infiltrate in left upper lobe best seen on lateral view. IMPRESSION: No pulmonary edema. Mild perihilar bronchitic changes. Streaky infiltrate in left upper lobe best seen on lateral view. Electronically Signed   By: Lahoma Crocker M.D.   On: 10/07/2015 16:22   Dg Chest 2 View  09/23/2015  CLINICAL DATA:  Initial evaluation for productive cough since 09/20/2015. EXAM: CHEST  2 VIEW COMPARISON:  Prior study of 09/08/2015. FINDINGS: The cardiac and mediastinal silhouettes are stable in size and contour, and remain within normal limits. The lungs are normally inflated. No airspace consolidation, pleural effusion, or pulmonary edema is identified. There is no pneumothorax. No acute osseous abnormality identified. IMPRESSION: No active cardiopulmonary disease. Electronically Signed  By: Jeannine Boga M.D.   On: 09/23/2015 04:25   Dg Chest Port 1 View  10/07/2015  CLINICAL DATA:  Right PICC placement.  Initial encounter. EXAM: PORTABLE CHEST 1 VIEW COMPARISON:  Chest radiograph performed earlier today at 4:12 p.m. FINDINGS: The patient's right PICC is noted ending about the distal SVC. Mild vascular congestion is noted, with mild right basilar atelectasis. No pleural effusion or pneumothorax is seen. The cardiomediastinal silhouette is borderline normal in size. No acute osseous abnormalities are identified. IMPRESSION: 1. Right PICC noted ending about  the distal SVC. 2. Mild vascular congestion, with mild right basilar atelectasis. Electronically Signed   By: Garald Balding M.D.   On: 10/07/2015 23:51      Recent Results (from the past 240 hour(s))  Culture, blood (routine x 2)     Status: None   Collection Time: 10/07/15  7:45 PM  Result Value Ref Range Status   Specimen Description BLOOD LEFT ARM  Final   Special Requests IN PEDIATRIC BOTTLE 3CC  Final   Culture   Final    NO GROWTH 5 DAYS Performed at [facility]    Report Status 10/12/2015 FINAL  Final  Culture, blood (routine x 2)     Status: None   Collection Time: 10/07/15  8:00 PM  Result Value Ref Range Status   Specimen Description BLOOD RIGHT ARM  Final   Special Requests BOTTLES DRAWN AEROBIC ONLY 5CC  Final   Culture   Final    NO GROWTH 5 DAYS Performed at [facility]    Report Status 10/12/2015 FINAL  Final    BRIEF ADMITTING H & P: 24 year old female with past medical history sickle cell disease, related sickle cell anemia. Patient presented to [facility] with worsening generalized pain, mostly in shoulders and back area for past couple of days to 2 weeks in duration. Patient report pain being 10 out of 10 in intensity, sharp, constant. Patient reports taking at home analgesia which did not provide significant symptomatic relief. She did however mention that she just recently got her insurance card and 80 to follow-up with primary care physician to get refills on her medications. She reports having ongoing cough, mucousy, some shortness of breath with exertion. She also reports having fevers at home but not sure what the highest temperature was. Patient reports she was taking Tessalon cough drops with no significant improvement in cough. No reports of chest pain or palpitations. No abdominal pain, nausea or vomiting. No reports of blood in the stool or urine. No diarrhea or constipation. No lightheadedness or loss of consciousness.  In ED,  vital signs were stable except for fever of 101.60F. Blood work demonstrated leukocytosis of 14.7, hemoglobin 8.7, platelets 659, potassium 3.4. Chest x-ray demonstrated no pulmonary edema but that there were infiltrates in the left upper lobe. She was started on empiric Levaquin for possible pneumonia while awaiting blood culture results, Streptococcus and Legionella results.   Hospital Course:  Present on Admission:  . Sickle cell pain crisis ([place]): Pt was managed with IV Dilaudid via PCA., Toradol and IVF. Transition to oral medications was achieved 24 hours prior to discharge. She is discharged home on oral dilaudid and Ibuprofen. She is to resume Oxycodone after treatment completed with oral Dilaudid. She was given a Prescription for Dilaudid 4 mg #20 tabs. . Back Spasms:Pt was started on Flexeril and reports significant relief. She was given a prescription for Flexeril upon discharge. . Community Acquired Pneumonia:  Pt was treated with Levaquin andhas completed the course f therapy. At the time of discharge, she had no requirement for oxygen supplementation. . Constipation: Pt finally had a BM after several laxatives. This was likely related to opiate induced constipation. . Anemia of Chronic Disease: Hb at baseline at time of discharge   Disposition and Follow-up: Pt discharged in stable condition and will follow up with Dr. Alyson Ingles in 3-7 days.      Discharge Instructions    Activity as tolerated - No restrictions    Complete by:  As directed      Diet general    Complete by:  As directed            DISCHARGE EXAM:  General: Alert, awake, oriented x3, in no apparent distress distress.  Vital Signs: BP123/74, HR 98, T 98.8 F (37.1 C), temperature source Oral,RR 18, height 5\' 3"  (1.6 m), weight 181 lb 11.2 oz (82.419 kg), last menstrual period 10/07/2015, SpO2 100 % on RA. HEENT: [place]/AT PEERL, EOMI, anicteric Neck: Trachea midline, no masses, no thyromegal,y no JVD, no carotid  bruit OROPHARYNX: Moist, No exudate/ erythema/lesions.  Heart: Regular rate and rhythm, without murmurs, rubs, gallops or S3. PMI non-displaced. Exam reveals no decreased pulses. Pulmonary/Chest: Normal effort. Breath sounds normal. No. Apnea. Clear to auscultation,no stridor,  no wheezing and no rhonchi noted. No respiratory distress and no tenderness noted. Abdomen: Soft, nontender, nondistended, normal bowel sounds, no masses no hepatosplenomegaly noted. No fluid wave and no ascites. There is no guarding or rebound. Neuro: Alert and oriented to person, place and time. Normal motor skills, Displays no atrophy or tremors and exhibits normal muscle tone.  No focal neurological deficits noted cranial nerves II through XII grossly intact. No sensory deficit noted. Strength at baseline in bilateral upper and lower extremities. Gait normal. Musculoskeletal: No warm swelling or erythema around joints, no spinal tenderness noted. Psychiatric: Patient alert and oriented x3, good insight and cognition, good recent to remote recall. Mood, memory, affect and judgement normal Lymph node survey: No cervical axillary or inguinal lymphadenopathy noted. Skin: Skin is warm and dry. No bruising, no ecchymosis and no rash noted. Pt is not diaphoretic. No erythema. No pallor       Recent Labs  10/13/15 0618  NA 133*  K 3.6  CL 97*  CO2 29  GLUCOSE 103*  BUN 6  CREATININE 0.45  CALCIUM 9.1    Recent Labs  10/13/15 0618  AST 106*  ALT 96*  ALKPHOS 65  BILITOT 2.5*  PROT 7.1  ALBUMIN 3.7   No results for input(s): LIPASE, AMYLASE in the last 72 hours.  Recent Labs  10/13/15 0618  WBC 11.5*  NEUTROABS 4.7  HGB 7.6*  HCT 22.3*  MCV 83.2  PLT 426*     Total time spent including face to face and decision making was greater than 30 minutes  Signed: Dayanara Sherrill A. 10/14/2015, 1:34 PM

## 2015-10-14 NOTE — Progress Notes (Signed)
Discharge instructions reviewed with patient and handout given. Patient ready for discharge.

## 2015-10-14 NOTE — Plan of Care (Signed)
Problem: Phase III Progression Outcomes Goal: Progress with ambulation Outcome: Completed/Met Date Met:  10/14/15 Independent with ambulation

## 2015-10-14 NOTE — Plan of Care (Signed)
Problem: Phase I Progression Outcomes Goal: Bowel Movement At Least Every 3 Days Outcome: Progressing Patient given Magnesium citrate to aide with bowel movement. Patient states this has helped her in the past.

## 2015-10-17 ENCOUNTER — Inpatient Hospital Stay (HOSPITAL_COMMUNITY)
Admission: EM | Admit: 2015-10-17 | Discharge: 2015-10-24 | DRG: 812 | Disposition: A | Discharge status: Home / Self Care | Payer: Medicaid Other | Attending: Internal Medicine | Admitting: Internal Medicine

## 2015-10-17 ENCOUNTER — Encounter (HOSPITAL_COMMUNITY): Payer: Self-pay | Admitting: Emergency Medicine

## 2015-10-17 ENCOUNTER — Emergency Department (HOSPITAL_COMMUNITY): Payer: Medicaid Other

## 2015-10-17 DIAGNOSIS — D72829 Elevated white blood cell count, unspecified: Secondary | ICD-10-CM | POA: Diagnosis present

## 2015-10-17 DIAGNOSIS — Z832 Family history of diseases of the blood and blood-forming organs and certain disorders involving the immune mechanism: Secondary | ICD-10-CM | POA: Diagnosis not present

## 2015-10-17 DIAGNOSIS — D473 Essential (hemorrhagic) thrombocythemia: Secondary | ICD-10-CM | POA: Diagnosis present

## 2015-10-17 DIAGNOSIS — Z8249 Family history of ischemic heart disease and other diseases of the circulatory system: Secondary | ICD-10-CM | POA: Diagnosis not present

## 2015-10-17 DIAGNOSIS — K5900 Constipation, unspecified: Secondary | ICD-10-CM | POA: Diagnosis present

## 2015-10-17 DIAGNOSIS — Z841 Family history of disorders of kidney and ureter: Secondary | ICD-10-CM

## 2015-10-17 DIAGNOSIS — E860 Dehydration: Secondary | ICD-10-CM

## 2015-10-17 DIAGNOSIS — D638 Anemia in other chronic diseases classified elsewhere: Secondary | ICD-10-CM | POA: Diagnosis not present

## 2015-10-17 DIAGNOSIS — D5700 Hb-SS disease with crisis, unspecified: Principal | ICD-10-CM | POA: Diagnosis present

## 2015-10-17 DIAGNOSIS — K5909 Other constipation: Secondary | ICD-10-CM | POA: Diagnosis not present

## 2015-10-17 DIAGNOSIS — R000 Tachycardia, unspecified: Secondary | ICD-10-CM | POA: Diagnosis present

## 2015-10-17 LAB — CBC WITH DIFFERENTIAL/PLATELET
Basophils Absolute: 0 10*3/uL (ref 0.0–0.1)
Basophils Relative: 0 %
EOS PCT: 0 %
Eosinophils Absolute: 0 10*3/uL (ref 0.0–0.7)
HEMATOCRIT: 26.2 % — AB (ref 36.0–46.0)
Hemoglobin: 9.2 g/dL — ABNORMAL LOW (ref 12.0–15.0)
LYMPHS ABS: 2.3 10*3/uL (ref 0.7–4.0)
LYMPHS PCT: 19 %
MCH: 29.2 pg (ref 26.0–34.0)
MCHC: 35.1 g/dL (ref 30.0–36.0)
MCV: 83.2 fL (ref 78.0–100.0)
MONO ABS: 2.4 10*3/uL — AB (ref 0.1–1.0)
MONOS PCT: 20 %
NEUTROS ABS: 7.4 10*3/uL (ref 1.7–7.7)
Neutrophils Relative %: 61 %
PLATELETS: 574 10*3/uL — AB (ref 150–400)
RBC: 3.15 MIL/uL — ABNORMAL LOW (ref 3.87–5.11)
RDW: 16 % — AB (ref 11.5–15.5)
WBC: 12.2 10*3/uL — ABNORMAL HIGH (ref 4.0–10.5)

## 2015-10-17 LAB — COMPREHENSIVE METABOLIC PANEL
ALBUMIN: 5 g/dL (ref 3.5–5.0)
ALT: 62 U/L — ABNORMAL HIGH (ref 14–54)
ANION GAP: 12 (ref 5–15)
AST: 42 U/L — AB (ref 15–41)
Alkaline Phosphatase: 82 U/L (ref 38–126)
BILIRUBIN TOTAL: 2.6 mg/dL — AB (ref 0.3–1.2)
BUN: 20 mg/dL (ref 6–20)
CHLORIDE: 104 mmol/L (ref 101–111)
CO2: 23 mmol/L (ref 22–32)
Calcium: 10.4 mg/dL — ABNORMAL HIGH (ref 8.9–10.3)
Creatinine, Ser: 0.77 mg/dL (ref 0.44–1.00)
GFR calc Af Amer: >60 mL/min (ref >60)
GFR calc non Af Amer: >60 mL/min (ref >60)
GLUCOSE: 97 mg/dL (ref 65–99)
POTASSIUM: 4.1 mmol/L (ref 3.5–5.1)
SODIUM: 139 mmol/L (ref 135–145)
TOTAL PROTEIN: 9.7 g/dL — AB (ref 6.5–8.1)

## 2015-10-17 LAB — TYPE AND SCREEN
ABO/RH(D): O POS
Antibody Screen: NEGATIVE

## 2015-10-17 LAB — RETICULOCYTES
RBC.: 3.16 MIL/uL — ABNORMAL LOW (ref 3.87–5.11)
RETIC COUNT ABSOLUTE: 180.1 10*3/uL (ref 19.0–186.0)
Retic Ct Pct: 5.7 % — ABNORMAL HIGH (ref 0.4–3.1)

## 2015-10-17 LAB — LACTATE DEHYDROGENASE: LDH: 216 U/L — AB (ref 98–192)

## 2015-10-17 MED ORDER — HYDROMORPHONE 1 MG/ML IV SOLN
INTRAVENOUS | Status: DC
  Administered 2015-10-17: 2 mg via INTRAVENOUS
  Administered 2015-10-17: 4 mg via INTRAVENOUS
  Administered 2015-10-18: 10:00:00 via INTRAVENOUS
  Administered 2015-10-18: 6.5 mg via INTRAVENOUS
  Administered 2015-10-18: 9.5 mg via INTRAVENOUS
  Administered 2015-10-18: 5.5 mg via INTRAVENOUS
  Administered 2015-10-18: 3.5 mg via INTRAVENOUS
  Administered 2015-10-19: 12 mg via INTRAVENOUS
  Administered 2015-10-19: 5.5 mg via INTRAVENOUS
  Administered 2015-10-19 (×2): via INTRAVENOUS
  Administered 2015-10-19: 12.5 mg via INTRAVENOUS
  Administered 2015-10-19: 7.5 mg via INTRAVENOUS
  Administered 2015-10-19: 6 mg via INTRAVENOUS
  Administered 2015-10-19: 5.5 mg via INTRAVENOUS
  Administered 2015-10-20: 22:00:00 via INTRAVENOUS
  Administered 2015-10-20 (×2): 4.5 mg via INTRAVENOUS
  Administered 2015-10-20: 10:00:00 via INTRAVENOUS
  Administered 2015-10-20: 6 mg via INTRAVENOUS
  Administered 2015-10-20: 8.29 mg via INTRAVENOUS
  Administered 2015-10-20: 9.5 mg via INTRAVENOUS
  Administered 2015-10-21: 9 mg via INTRAVENOUS
  Administered 2015-10-21: 6 mg via INTRAVENOUS
  Administered 2015-10-21: 7 mg via INTRAVENOUS
  Administered 2015-10-21 (×2): 8.5 mg via INTRAVENOUS
  Administered 2015-10-21: 6 mg via INTRAVENOUS
  Administered 2015-10-21: 14:00:00 via INTRAVENOUS
  Administered 2015-10-21: 1 mg via INTRAVENOUS
  Administered 2015-10-22: 4.5 mg via INTRAVENOUS
  Administered 2015-10-22: 4 mg via INTRAVENOUS
  Administered 2015-10-22: 15:00:00 via INTRAVENOUS
  Administered 2015-10-23: 3 mg via INTRAVENOUS
  Administered 2015-10-23: 9 mg via INTRAVENOUS
  Administered 2015-10-23: 5.5 mg via INTRAVENOUS
  Administered 2015-10-23: 8.5 mg via INTRAVENOUS
  Administered 2015-10-23: 9.5 mg via INTRAVENOUS
  Filled 2015-10-17 (×11): qty 25

## 2015-10-17 MED ORDER — HYDROMORPHONE HCL 1 MG/ML IJ SOLN
2.0000 mg | INTRAMUSCULAR | Status: DC | PRN
  Administered 2015-10-17 (×3): 2 mg via INTRAVENOUS
  Filled 2015-10-17 (×3): qty 2

## 2015-10-17 MED ORDER — ONDANSETRON HCL 4 MG/2ML IJ SOLN
4.0000 mg | Freq: Four times a day (QID) | INTRAMUSCULAR | Status: DC | PRN
  Administered 2015-10-18 – 2015-10-22 (×9): 4 mg via INTRAVENOUS
  Filled 2015-10-17 (×11): qty 2

## 2015-10-17 MED ORDER — HYDROMORPHONE HCL 1 MG/ML IJ SOLN
2.0000 mg | Freq: Once | INTRAMUSCULAR | Status: AC
  Administered 2015-10-17: 2 mg via INTRAVENOUS
  Filled 2015-10-17: qty 2

## 2015-10-17 MED ORDER — MAGNESIUM CITRATE PO SOLN
1.0000 | Freq: Once | ORAL | Status: AC
  Administered 2015-10-17: 1 via ORAL
  Filled 2015-10-17: qty 296

## 2015-10-17 MED ORDER — KETOROLAC TROMETHAMINE 30 MG/ML IJ SOLN
30.0000 mg | Freq: Once | INTRAMUSCULAR | Status: AC
  Administered 2015-10-17: 30 mg via INTRAVENOUS
  Filled 2015-10-17: qty 1

## 2015-10-17 MED ORDER — ONDANSETRON HCL 4 MG/2ML IJ SOLN
4.0000 mg | INTRAMUSCULAR | Status: DC | PRN
  Administered 2015-10-17: 4 mg via INTRAVENOUS
  Filled 2015-10-17: qty 2

## 2015-10-17 MED ORDER — CYCLOBENZAPRINE HCL 10 MG PO TABS
10.0000 mg | ORAL_TABLET | Freq: Three times a day (TID) | ORAL | Status: DC | PRN
  Administered 2015-10-23 (×2): 10 mg via ORAL
  Filled 2015-10-17 (×2): qty 1

## 2015-10-17 MED ORDER — HYDROMORPHONE HCL 1 MG/ML IJ SOLN
1.0000 mg | INTRAMUSCULAR | Status: AC
  Administered 2015-10-17 – 2015-10-18 (×8): 1 mg via INTRAVENOUS
  Filled 2015-10-17 (×8): qty 1

## 2015-10-17 MED ORDER — NALOXONE HCL 0.4 MG/ML IJ SOLN: 0.4000 mg | INTRAMUSCULAR | Status: DC | PRN

## 2015-10-17 MED ORDER — HYDROMORPHONE HCL 2 MG/ML IJ SOLN: 0.0250 mg/kg | INTRAMUSCULAR | Status: DC

## 2015-10-17 MED ORDER — SODIUM CHLORIDE 0.9 % IJ SOLN: 9.0000 mL | INTRAMUSCULAR | Status: DC | PRN

## 2015-10-17 MED ORDER — HYDROMORPHONE HCL 1 MG/ML IJ SOLN
1.0000 mg | INTRAMUSCULAR | Status: DC | PRN
  Administered 2015-10-18 – 2015-10-19 (×5): 1 mg via INTRAVENOUS
  Filled 2015-10-17 (×5): qty 1

## 2015-10-17 MED ORDER — ENOXAPARIN SODIUM 30 MG/0.3ML ~~LOC~~ SOLN
30.0000 mg | Freq: Two times a day (BID) | SUBCUTANEOUS | Status: DC
  Administered 2015-10-17: 30 mg via SUBCUTANEOUS
  Filled 2015-10-17 (×5): qty 0.3

## 2015-10-17 MED ORDER — FOLIC ACID 1 MG PO TABS
1.0000 mg | ORAL_TABLET | Freq: Every day | ORAL | Status: DC
  Administered 2015-10-17 – 2015-10-24 (×8): 1 mg via ORAL
  Filled 2015-10-17 (×8): qty 1

## 2015-10-17 MED ORDER — KETOROLAC TROMETHAMINE 30 MG/ML IJ SOLN
30.0000 mg | Freq: Four times a day (QID) | INTRAMUSCULAR | Status: AC
Start: 2015-10-17 — End: 2015-10-22
  Administered 2015-10-17 – 2015-10-22 (×19): 30 mg via INTRAVENOUS
  Filled 2015-10-17 (×21): qty 1

## 2015-10-17 MED ORDER — DIPHENHYDRAMINE HCL 50 MG/ML IJ SOLN
25.0000 mg | Freq: Once | INTRAMUSCULAR | Status: AC
  Administered 2015-10-17: 25 mg via INTRAVENOUS
  Filled 2015-10-17: qty 1

## 2015-10-17 MED ORDER — SODIUM CHLORIDE 0.45 % IV SOLN
INTRAVENOUS | Status: DC
  Administered 2015-10-17: 10:00:00 via INTRAVENOUS
  Administered 2015-10-17 – 2015-10-18 (×2): 1000 mL via INTRAVENOUS
  Administered 2015-10-18: 10:00:00 via INTRAVENOUS
  Administered 2015-10-19: 1000 mL via INTRAVENOUS

## 2015-10-17 MED ORDER — SENNOSIDES-DOCUSATE SODIUM 8.6-50 MG PO TABS
1.0000 | ORAL_TABLET | Freq: Two times a day (BID) | ORAL | Status: DC
  Administered 2015-10-17 – 2015-10-24 (×15): 1 via ORAL
  Filled 2015-10-17 (×16): qty 1

## 2015-10-17 MED ORDER — POLYETHYLENE GLYCOL 3350 17 G PO PACK
17.0000 g | PACK | Freq: Every day | ORAL | Status: DC | PRN
  Administered 2015-10-19: 17 g via ORAL
  Filled 2015-10-17 (×2): qty 1

## 2015-10-17 NOTE — ED Notes (Signed)
Per pt, states sickle cell crisis that started this am-pain in both legs

## 2015-10-17 NOTE — Progress Notes (Signed)
This CM spoke with pt due to receiving CM consult for medication needs. Pt states that she doesn't have trouble financially affording medications, but her PCP is "refusing to do something that he needs to do for her insurance". When I questioned pt further and asked if she wanted me to try and help in some way pt states "No, it is getting taken care of." CM will continue to follow for DC needs. Marney Doctor RN,BSN,NCM [phone]

## 2015-10-17 NOTE — H&P (Signed)
Hospital Admission Note Date: 10/17/2015  Patient name: Carmen [name] Medical record number: [id number] Date of birth: 05/31/1991 Age: 24 y.o. 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 Pt went home and was unable to fill prescription as she in on a lock-in tp Dr. Alyson Ingles. Pr tried to reach her Physician who she reports was unresponsive to her requests to be seen and finally advised her to go to the ED as he is closed today.  Pt reports that she has tried to treat her pain with ibuprofen and was managing for the last day however when the temperature dropped today, she was unable to tolerate the pain and came to the ED as advised. She describes the pain as 10/10, localized ot her back and legs and throbbing and intermittently sharp in nature. The pain is non-radiating and not associated with any other symptoms.    She denies HA, SOB, Cough, F/C, N/V/D. In the ED she received 3 doses of Dilaudid and one dose of Toradol with no significant relief in pain. She is admitted for Hb Ss with crisis with the main issue being that she has no pain medications at home and is unable to get access to them due to the dynamics with her PMD.  Scheduled Meds:  Continuous Infusions: . sodium chloride 500 mL/hr at 10/17/15 1016   PRN Meds:.HYDROmorphone, ondansetron Allergies: Review of patient's allergies indicates no known allergies. Past Medical History  Diagnosis Date  . Sickle cell crisis ([place])   . Headache(784.0)   . Abortion     05/2012   Past Surgical History  Procedure Laterality Date  . Splenectomy    . Cholecystectomy N/A 11/30/2014    Procedure: LAPAROSCOPIC CHOLECYSTECTOMY SINGLE SITE WITH INTRAOPERATIVE CHOLANGIOGRAM;  Surgeon: Michael Boston, MD;  Location: [facility];  Service: General;  Laterality: N/A;   Family History  Problem Relation Age of Onset  . Hypertension Mother   . Sickle cell anemia Sister   . Kidney disease Sister     Lupus  . Arthritis Sister   . Sickle cell anemia Sister   . Sickle cell trait Sister   . Heart disease Maternal Aunt   . Heart disease Maternal Uncle    Social History   Social History  . Marital Status: Single    Spouse Name: N/A  . Number of Children: N/A  . Years of Education: N/A   Occupational History  . Not on file.   Social History Main Topics  . Smoking status: Never Smoker   . Smokeless tobacco: Never Used  . Alcohol Use: No  . Drug Use: No  . Sexual Activity: Yes    Birth Control/ Protection: None   Other Topics Concern  . Not on file   Social History Narrative   Review of Systems: A comprehensive review of systems was negative except as noted in the HPI. Physical Exam: No intake or output data in the 24 hours ending 10/17/15 1219 General: Alert, awake, oriented x3, in moderate distress.  HEENT: [place]/AT PEERL, EOMI, anicteric Neck: Trachea midline,  no masses, no thyromegal,y no JVD, no carotid bruit OROPHARYNX:  Moist, No exudate/ erythema/lesions.  Heart: Regular rate and rhythm, without murmurs, rubs, gallops, PMI non-displaced, no heaves or thrills  on palpation.  Lungs: Clear to auscultation, no wheezing or rhonchi noted. No increased vocal fremitus resonant to percussion  Abdomen: Soft, nontender, nondistended, positive bowel sounds, no masses no hepatosplenomegaly noted..  Neuro: No focal neurological deficits noted cranial nerves II through XII grossly intact.  Strength at functional baseline in bilateral upper and lower extremities. Musculoskeletal: No warm swelling or erythema around joints, no spinal tenderness noted. Psychiatric: Patient alert and oriented x3, good insight and cognition, good recent to remote recall.   Lab results:  Recent Labs   10/17/15 1002  NA 139  K 4.1  CL 104  CO2 23  GLUCOSE 97  BUN 20  CREATININE 0.77  CALCIUM 10.4*    Recent Labs  10/17/15 1002  AST 42*  ALT 62*  ALKPHOS 82  BILITOT 2.6*  PROT 9.7*  ALBUMIN 5.0   No results for input(s): LIPASE, AMYLASE in the last 72 hours.  Recent Labs  10/17/15 1002  WBC 12.2*  NEUTROABS 7.4  HGB 9.2*  HCT 26.2*  MCV 83.2  PLT 574*   No results for input(s): CKTOTAL, CKMB, CKMBINDEX, TROPONINI in the last 72 hours. Invalid input(s): POCBNP No results for input(s): DDIMER in the last 72 hours. No results for input(s): HGBA1C in the last 72 hours. No results for input(s): CHOL, HDL, LDLCALC, TRIG, CHOLHDL, LDLDIRECT in the last 72 hours. No results for input(s): TSH, T4TOTAL, T3FREE, THYROIDAB in the last 72 hours.  Invalid input(s): FREET3  Recent Labs  10/17/15 1002  RETICCTPCT 5.7*   Imaging results:  Dg Chest 2 View  10/17/2015  CLINICAL DATA:  Sickle cell patient. Recent diagnosis of pneumonia. Shortness of breath. Diarrhea. Productive cough. Chest pain for 1 week. EXAM: CHEST  2 VIEW COMPARISON:  10/07/2015 FINDINGS: The right-sided PICC line is been removed. Minimal linear scarring along the right hemidiaphragm the lungs appear otherwise clear. Cardiac and mediastinal margins appear normal. No pleural effusion identified. IMPRESSION: 1. The lungs currently appear clear aside from some mild chronic scarring along the right hemidiaphragm. Electronically Signed   By: Van Clines M.D.   On: 10/17/2015 10:58   Dg Chest 2 View  10/07/2015  CLINICAL DATA:  Shortness of breath, productive cough EXAM: CHEST  2 VIEW COMPARISON:  09/23/2015 FINDINGS: Cardiomediastinal silhouette is stable. No pulmonary edema. Mild perihilar bronchitic changes. There is streaky infiltrate in left upper lobe best seen on lateral view. IMPRESSION: No pulmonary edema. Mild perihilar bronchitic changes. Streaky infiltrate in left upper lobe best seen on lateral  view. Electronically Signed   By: Lahoma Crocker M.D.   On: 10/07/2015 16:22   Dg Chest 2 View  09/23/2015  CLINICAL DATA:  Initial evaluation for productive cough since 09/20/2015. EXAM: CHEST  2 VIEW COMPARISON:  Prior study of 09/08/2015. FINDINGS: The cardiac and mediastinal silhouettes are stable in size and contour, and remain within normal limits. The lungs are normally inflated. No airspace consolidation, pleural effusion, or pulmonary edema is identified. There is no pneumothorax. No acute osseous abnormality identified. IMPRESSION: No active cardiopulmonary disease. Electronically Signed   By: Jeannine Boga M.D.   On: 09/23/2015 04:25   Dg Chest Port 1 View  10/07/2015  CLINICAL DATA:  Right PICC placement.  Initial encounter. EXAM: PORTABLE CHEST 1 VIEW COMPARISON:  Chest radiograph performed earlier today at 4:12 p.m. FINDINGS: The patient's right PICC is noted ending about the distal SVC. Mild vascular congestion is noted, with mild right basilar atelectasis. No pleural effusion or pneumothorax is seen.  The cardiomediastinal silhouette is borderline normal in size. No acute osseous abnormalities are identified. IMPRESSION: 1. Right PICC noted ending about the distal SVC. 2. Mild vascular congestion, with mild right basilar atelectasis. Electronically Signed   By: Garald Balding M.D.   On: 10/07/2015 23:51   Assessment and Plan:  Hb SS with Crisis: Will start patient on Dilaudid via PCA,  Clinician assisted doses scheduled for the next 24 hours and Toradol. Will re-assess pain control in the morning. Unfortunately I am concerned that patient will encounter the same problem with inability to obtain pain medications at time of discharge. I will consult Case Management for assistance.  Mild Dehydration: Continue hydration with hypotonic saline due to her crisis.  Thrombocytosis: Likely reactive.  Anemia of Chronic Disease: Pt currently above baseline but is likely a reflection of  hemoconcentration.  Leukocytosis: Likely reactive.   Time spent 1 hour   Cathye Kreiter A. 10/17/2015, 12:19 PM

## 2015-10-17 NOTE — ED Provider Notes (Signed)
CSN: [id number]     Arrival date & time 10/17/15  0830 History   First MD Initiated Contact with Patient 10/17/15 [phone]     Chief Complaint  Patient presents with  . Sickle Cell Pain Crisis     (Consider location/radiation/quality/duration/timing/severity/associated sxs/prior Treatment) HPI Comments: Patient here complaining of pain to her bilateral lower legs which began this morning. She says this is typical of her sickle cell crisis. Recently admitted this week for pain crisis. Denies any fever or chills. Has had some dyspnea but no cough. No rashes noted. Denies any cough or congestion. No neurological symptoms. Denies any urinary symptoms. Did not have any Percocet at home to take for this. Pain in her legs is characterized as dull and worse with walking and nothing makes it better. Called her physician and told to come here.  Patient is a 24 y.o. female presenting with sickle cell pain. The history is provided by the patient.  Sickle Cell Pain Crisis   Past Medical History  Diagnosis Date  . Sickle cell crisis ([place])   . Headache(784.0)   . Abortion     05/2012   Past Surgical History  Procedure Laterality Date  . Splenectomy    . Cholecystectomy N/A 11/30/2014    Procedure: LAPAROSCOPIC CHOLECYSTECTOMY SINGLE SITE WITH INTRAOPERATIVE CHOLANGIOGRAM;  Surgeon: Michael Boston, MD;  Location: [facility];  Service: General;  Laterality: N/A;   Family History  Problem Relation Age of Onset  . Hypertension Mother   . Sickle cell anemia Sister   . Kidney disease Sister     Lupus  . Arthritis Sister   . Sickle cell anemia Sister   . Sickle cell trait Sister   . Heart disease Maternal Aunt   . Heart disease Maternal Uncle    Social History  Substance Use Topics  . Smoking status: Never Smoker   . Smokeless tobacco: Never Used  . Alcohol Use: No   OB History    Gravida Para Term Preterm AB TAB SAB Ectopic Multiple Living   1    1          Review of Systems  All other systems  reviewed and are negative.     Allergies  Review of patient's allergies indicates no known allergies.  Home Medications   Prior to Admission medications   Medication Sig Start Date End Date Taking? Authorizing Provider  cyclobenzaprine (FLEXERIL) 10 MG tablet Take 1 tablet (10 mg total) by mouth 3 (three) times daily as needed for muscle spasms. 10/14/15   Leana Gamer, MD  folic acid (FOLVITE) 1 MG tablet Take 1 tablet (1 mg total) by mouth daily. 09/14/15   Elwyn Reach, MD  HYDROmorphone (DILAUDID) 4 MG tablet Take 1 tablet (4 mg total) by mouth every 4 (four) hours. 10/14/15   Leana Gamer, MD  naproxen (NAPROSYN) 500 MG tablet Take 1 po BID with food prn pain 09/23/15   Rolland Porter, MD  oxyCODONE-acetaminophen (PERCOCET) 10-325 MG tablet Take 1 tablet by mouth every 4 (four) hours as needed for pain. Hold until Oral Dilaudid completed. Then resume as prescribed. 10/14/15   Leana Gamer, MD  potassium chloride SA (K-DUR,KLOR-CON) 20 MEQ tablet Take 1 tablet (20 mEq total) by mouth 2 (two) times daily. Patient not taking: Reported on 10/07/2015 09/23/15   Rolland Porter, MD   BP 120/86 mmHg  Pulse 140  Temp(Src) 98.7 F (37.1 C) (Oral)  Resp 16  SpO2 100%  LMP 10/07/2015 Physical  Exam  Constitutional: She is oriented to person, place, and time. She appears well-developed and well-nourished.  Non-toxic appearance. No distress.  HENT:  Head: Normocephalic and atraumatic.  Eyes: Conjunctivae, EOM and lids are normal. Pupils are equal, round, and reactive to light.  Neck: Normal range of motion. Neck supple. No tracheal deviation present. No thyroid mass present.  Cardiovascular: Regular rhythm and normal heart sounds.  Tachycardia present.  Exam reveals no gallop.   No murmur heard. Pulmonary/Chest: Effort normal and breath sounds normal. No stridor. No respiratory distress. She has no decreased breath sounds. She has no wheezes. She has no rhonchi. She has no  rales.  Abdominal: Soft. Normal appearance and bowel sounds are normal. She exhibits no distension. There is no tenderness. There is no rebound and no CVA tenderness.  Musculoskeletal: Normal range of motion. She exhibits no edema or tenderness.  No edema noted to lower extremities. Diffusely tender to palpation  Neurological: She is alert and oriented to person, place, and time. She has normal strength. No cranial nerve deficit or sensory deficit. GCS eye subscore is 4. GCS verbal subscore is 5. GCS motor subscore is 6.  Skin: Skin is warm and dry. No abrasion and no rash noted.  Psychiatric: She has a normal mood and affect. Her speech is normal and behavior is normal.  Nursing note and vitals reviewed.   ED Course  Procedures (including critical care time) Labs Review Labs Reviewed  COMPREHENSIVE METABOLIC PANEL  CBC WITH DIFFERENTIAL/PLATELET  RETICULOCYTES  TYPE AND SCREEN    Imaging Review No results found. I have personally reviewed and evaluated these images and lab results as part of my medical decision-making.   EKG Interpretation None      MDM   Final diagnoses:  None    Patient given IV fluids here along with 3 doses of hydromorphone IV and remains tachycardic. Will be given a fourth dose of hydromorphone as well as a dose of Toradol IV. Have spoken with the sickle cell physician and patient to be admitted    Lacretia Leigh, MD 10/17/15 1154

## 2015-10-18 DIAGNOSIS — K5909 Other constipation: Secondary | ICD-10-CM

## 2015-10-18 MED ORDER — MAGNESIUM CITRATE PO SOLN
1.0000 | Freq: Once | ORAL | Status: AC
  Administered 2015-10-18: 1 via ORAL
  Filled 2015-10-18: qty 296

## 2015-10-19 MED ORDER — HYDROMORPHONE HCL 2 MG/ML IJ SOLN
2.0000 mg | INTRAMUSCULAR | Status: DC | PRN
  Administered 2015-10-19 – 2015-10-21 (×8): 2 mg via INTRAVENOUS
  Filled 2015-10-19 (×7): qty 1

## 2015-10-19 MED ORDER — HYDROMORPHONE HCL 2 MG/ML IJ SOLN
2.0000 mg | INTRAMUSCULAR | Status: DC | PRN
  Filled 2015-10-19: qty 1

## 2015-10-19 NOTE — Progress Notes (Signed)
[facility] PROGRESS NOTE  Carmen [name] [id number] DOB: 10-16-1991 DOA: 10/17/2015 PCP: [name], MD  Assessment/Plan: Active Problems:   Sickle cell crisis ([place])   Hb-SS disease with crisis ([place])  1. Hb [place] with crisis: Pt has used 26.5 mg with 57/53:demands/deliveries since in last 24 hours. Will continue PCa and clinician assisted doses as ordered. Continue Toradol and IVF. 2. Leukocytosis: Mild leukocytosis without evidence of infection. Likely related to crisis. 3. Anemia of Chronic Disease: Hb at baseline..  Code Status: Full Code Family Communication: N/A Disposition Plan: Not yet ready for discharge  [facility].  Pager [phone]. If 7PM-7AM, please contact night-coverage.  10/18/2015, 2:16 PM  LOS: 1 days   Interim History: Pt reports pain 9/10 and localized to legs. No BM since admission.  Consultants:  None  Procedures:  None  Antibiotics:  None   Objective: General: Alert, awake, oriented x3, in no acute distress.  HEENT: [place]/AT PEERL, EOMI. anicteric Neck: Trachea midline,  no masses, no thyromegal,y no JVD, no carotid bruit OROPHARYNX:  Moist, No exudate/ erythema/lesions.  Heart: Regular rate and rhythm, without murmurs, rubs, gallops, PMI non-displaced, no heaves or thrills on palpation.  Lungs: Clear to auscultation, no wheezing or rhonchi noted. No increased vocal fremitus resonant to percussion  Abdomen: Soft, nontender, nondistended, positive bowel sounds, no masses no hepatosplenomegaly noted.  Neuro: No focal neurological deficits noted cranial nerves II through XII grossly intact.  Strength at functional baseline in bilateral upper and lower extremities. Musculoskeletal: No warm swelling or erythema around joints, no spinal tenderness noted. Psychiatric: Patient alert and oriented x3, good insight and cognition, good recent to remote recall.    Data Reviewed: Basic Metabolic Panel:  Recent Labs Lab  10/13/15 0618 10/17/15 1002  NA 133* 139  K 3.6 4.1  CL 97* 104  CO2 29 23  GLUCOSE 103* 97  BUN 6 20  CREATININE 0.45 0.77  CALCIUM 9.1 10.4*   Liver Function Tests:  Recent Labs Lab 10/13/15 0618 10/17/15 1002  AST 106* 42*  ALT 96* 62*  ALKPHOS 65 82  BILITOT 2.5* 2.6*  PROT 7.1 9.7*  ALBUMIN 3.7 5.0   No results for input(s): LIPASE, AMYLASE in the last 168 hours. No results for input(s): AMMONIA in the last 168 hours. CBC:  Recent Labs Lab 10/13/15 0618 10/17/15 1002  WBC 11.5* 12.2*  NEUTROABS 4.7 7.4  HGB 7.6* 9.2*  HCT 22.3* 26.2*  MCV 83.2 83.2  PLT 426* 574*    Studies: Dg Chest 2 View  10/17/2015  CLINICAL DATA:  Sickle cell patient. Recent diagnosis of pneumonia. Shortness of breath. Diarrhea. Productive cough. Chest pain for 1 week. EXAM: CHEST  2 VIEW COMPARISON:  10/07/2015 FINDINGS: The right-sided PICC line is been removed. Minimal linear scarring along the right hemidiaphragm the lungs appear otherwise clear. Cardiac and mediastinal margins appear normal. No pleural effusion identified. IMPRESSION: 1. The lungs currently appear clear aside from some mild chronic scarring along the right hemidiaphragm. Electronically Signed   By: Van Clines M.D.   On: 10/17/2015 10:58   Dg Chest 2 View  10/07/2015  CLINICAL DATA:  Shortness of breath, productive cough EXAM: CHEST  2 VIEW COMPARISON:  09/23/2015 FINDINGS: Cardiomediastinal silhouette is stable. No pulmonary edema. Mild perihilar bronchitic changes. There is streaky infiltrate in left upper lobe best seen on lateral view. IMPRESSION: No pulmonary edema. Mild perihilar bronchitic changes. Streaky infiltrate in left upper lobe best seen on lateral view. Electronically Signed  By: Lahoma Crocker M.D.   On: 10/07/2015 16:22   Dg Chest 2 View  09/23/2015  CLINICAL DATA:  Initial evaluation for productive cough since 09/20/2015. EXAM: CHEST  2 VIEW COMPARISON:  Prior study of 09/08/2015. FINDINGS: The  cardiac and mediastinal silhouettes are stable in size and contour, and remain within normal limits. The lungs are normally inflated. No airspace consolidation, pleural effusion, or pulmonary edema is identified. There is no pneumothorax. No acute osseous abnormality identified. IMPRESSION: No active cardiopulmonary disease. Electronically Signed   By: Jeannine Boga M.D.   On: 09/23/2015 04:25   Dg Chest Port 1 View  10/07/2015  CLINICAL DATA:  Right PICC placement.  Initial encounter. EXAM: PORTABLE CHEST 1 VIEW COMPARISON:  Chest radiograph performed earlier today at 4:12 p.m. FINDINGS: The patient's right PICC is noted ending about the distal SVC. Mild vascular congestion is noted, with mild right basilar atelectasis. No pleural effusion or pneumothorax is seen. The cardiomediastinal silhouette is borderline normal in size. No acute osseous abnormalities are identified. IMPRESSION: 1. Right PICC noted ending about the distal SVC. 2. Mild vascular congestion, with mild right basilar atelectasis. Electronically Signed   By: Garald Balding M.D.   On: 10/07/2015 23:51    Scheduled Meds: . folic acid  1 mg Oral Daily  . HYDROmorphone   Intravenous 6 times per day  . ketorolac  30 mg Intravenous 4 times per day  . senna-docusate  1 tablet Oral BID    Time spent 25 minutes.

## 2015-10-19 NOTE — Progress Notes (Signed)
Pt requested pain medicine for break through pain. When I was about to give it to her, I was inserting the syringe to the port she said to wait so she could untangle the cords. I said I already have the port. She states are we going to have a problem today? I stated I wasn't sure what she was referring to. She became upset and requested to see the charge nurse. Charge nurse  Notified.

## 2015-10-19 NOTE — Progress Notes (Signed)
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 Hb [place] with crisis: Pt has used 40.5 mg with 86/51:demands/deliveries since in last 24 hours. Pain in back has escalated up to 10/10 likely related to weather changes. Will continue PCA at current doses and increase clinician assisted doses to 2 mg every 3 hours.. Continue Toradol and IVF. 2. Leukocytosis: Mild leukocytosis without evidence of infection. Likely related to crisis. 3. Anemia of Chronic Disease: Hb at baseline. 4. Constipation: Pt still has not had a BM. Mag Citrate given but not yet drank by patient. Pt asked ot drink Mag. Citrate.   Code Status: Full Code Family Communication: N/A Disposition Plan: Not yet ready for discharge  [place].  Pager [phone]. If 7PM-7AM, please contact night-coverage.  10/18/2015, 4:44 PM  LOS: 1 days   Interim History: Pt reports pain 8/10 in legs and 10/10 in back. Pt also c/o sharp pains in chest walls. Is up ambulating in halls. No BM since admission.  Consultants:  None  Procedures:  None  Antibiotics:  None   Objective:  General: Alert, awake, oriented x3, in no acute distress.  Vital Signs: BP 123/70, HR 100, T 98.3 F (36.8 C), temperature source Oral, RR 10, height 5\' 3"  (1.6 m), weight 176 lb 12.9 oz (80.2 kg), last menstrual period 10/07/2015, SpO2 93 %. HEENT: [place]/AT PEERL, EOMI. anicteric Neck: Trachea midline,  no masses, no thyromegal,y no JVD, no carotid bruit OROPHARYNX:  Moist, No exudate/ erythema/lesions.  Heart: Regular rate and rhythm, without murmurs, rubs, gallops, PMI non-displaced, no heaves or thrills on palpation.  Lungs: Clear to auscultation, no wheezing or rhonchi noted. No increased vocal fremitus resonant to percussion  Abdomen: Soft, nontender, nondistended,  positive bowel sounds, no masses no hepatosplenomegaly noted.  Neuro: No focal neurological deficits noted cranial nerves II through XII grossly intact.  Strength at functional baseline in bilateral upper and lower extremities. Musculoskeletal: No warm swelling or erythema around joints, no spinal tenderness noted. Psychiatric: Patient alert and oriented x3, good insight and cognition, good recent to remote recall.    Data Reviewed: Basic Metabolic Panel:  Recent Labs Lab 10/13/15 0618 10/17/15 1002  NA 133* 139  K 3.6 4.1  CL 97* 104  CO2 29 23  GLUCOSE 103* 97  BUN 6 20  CREATININE 0.45 0.77  CALCIUM 9.1 10.4*   Liver Function Tests:  Recent Labs Lab 10/13/15 0618 10/17/15 1002  AST 106* 42*  ALT 96* 62*  ALKPHOS 65 82  BILITOT 2.5* 2.6*  PROT 7.1 9.7*  ALBUMIN 3.7 5.0   No results for input(s): LIPASE, AMYLASE in the last 168 hours. No results for input(s): AMMONIA in the last 168 hours. CBC:  Recent Labs Lab 10/13/15 0618 10/17/15 1002  WBC 11.5* 12.2*  NEUTROABS 4.7 7.4  HGB 7.6* 9.2*  HCT 22.3* 26.2*  MCV 83.2 83.2  PLT 426* 574*    Studies: Dg Chest 2 View  10/17/2015  CLINICAL DATA:  Sickle cell patient. Recent diagnosis of pneumonia. Shortness of breath. Diarrhea. Productive cough. Chest pain for 1 week. EXAM: CHEST  2 VIEW COMPARISON:  10/07/2015 FINDINGS: The right-sided PICC line is been removed. Minimal linear scarring along the right hemidiaphragm the lungs appear otherwise clear. Cardiac and mediastinal margins appear normal. No pleural effusion identified. IMPRESSION: 1. The lungs currently appear  clear aside from some mild chronic scarring along the right hemidiaphragm. Electronically Signed   By: Van Clines M.D.   On: 10/17/2015 10:58   Dg Chest 2 View  10/07/2015  CLINICAL DATA:  Shortness of breath, productive cough EXAM: CHEST  2 VIEW COMPARISON:  09/23/2015 FINDINGS: Cardiomediastinal silhouette is stable. No pulmonary edema.  Mild perihilar bronchitic changes. There is streaky infiltrate in left upper lobe best seen on lateral view. IMPRESSION: No pulmonary edema. Mild perihilar bronchitic changes. Streaky infiltrate in left upper lobe best seen on lateral view. Electronically Signed   By: Lahoma Crocker M.D.   On: 10/07/2015 16:22   Dg Chest 2 View  09/23/2015  CLINICAL DATA:  Initial evaluation for productive cough since 09/20/2015. EXAM: CHEST  2 VIEW COMPARISON:  Prior study of 09/08/2015. FINDINGS: The cardiac and mediastinal silhouettes are stable in size and contour, and remain within normal limits. The lungs are normally inflated. No airspace consolidation, pleural effusion, or pulmonary edema is identified. There is no pneumothorax. No acute osseous abnormality identified. IMPRESSION: No active cardiopulmonary disease. Electronically Signed   By: Jeannine Boga M.D.   On: 09/23/2015 04:25   Dg Chest Port 1 View  10/07/2015  CLINICAL DATA:  Right PICC placement.  Initial encounter. EXAM: PORTABLE CHEST 1 VIEW COMPARISON:  Chest radiograph performed earlier today at 4:12 p.m. FINDINGS: The patient's right PICC is noted ending about the distal SVC. Mild vascular congestion is noted, with mild right basilar atelectasis. No pleural effusion or pneumothorax is seen. The cardiomediastinal silhouette is borderline normal in size. No acute osseous abnormalities are identified. IMPRESSION: 1. Right PICC noted ending about the distal SVC. 2. Mild vascular congestion, with mild right basilar atelectasis. Electronically Signed   By: Garald Balding M.D.   On: 10/07/2015 23:51    Scheduled Meds: . folic acid  1 mg Oral Daily  . HYDROmorphone   Intravenous 6 times per day  . ketorolac  30 mg Intravenous 4 times per day  . senna-docusate  1 tablet Oral BID    Time spent 25 minutes.

## 2015-10-20 LAB — CBC WITH DIFFERENTIAL/PLATELET
Basophils Absolute: 0 K/uL (ref 0.0–0.1)
Basophils Relative: 0 %
Eosinophils Absolute: 0.4 K/uL (ref 0.0–0.7)
Eosinophils Relative: 3 %
HCT: 18.4 % — ABNORMAL LOW (ref 36.0–46.0)
Hemoglobin: 6.6 g/dL — CL (ref 12.0–15.0)
Lymphocytes Relative: 40 %
Lymphs Abs: 5.3 K/uL — ABNORMAL HIGH (ref 0.7–4.0)
MCH: 28.9 pg (ref 26.0–34.0)
MCHC: 35.9 g/dL (ref 30.0–36.0)
MCV: 80.7 fL (ref 78.0–100.0)
Monocytes Absolute: 1.7 K/uL — ABNORMAL HIGH (ref 0.1–1.0)
Monocytes Relative: 13 %
Neutro Abs: 5.9 K/uL (ref 1.7–7.7)
Neutrophils Relative %: 44 %
Platelets: 637 K/uL — ABNORMAL HIGH (ref 150–400)
RBC: 2.28 MIL/uL — ABNORMAL LOW (ref 3.87–5.11)
RDW: 17.6 % — ABNORMAL HIGH (ref 11.5–15.5)
WBC: 13.3 K/uL — ABNORMAL HIGH (ref 4.0–10.5)

## 2015-10-20 LAB — BASIC METABOLIC PANEL WITH GFR
Anion gap: 8 (ref 5–15)
BUN: 9 mg/dL (ref 6–20)
CO2: 28 mmol/L (ref 22–32)
Calcium: 9.7 mg/dL (ref 8.9–10.3)
Chloride: 100 mmol/L — ABNORMAL LOW (ref 101–111)
Creatinine, Ser: 0.49 mg/dL (ref 0.44–1.00)
GFR calc Af Amer: >60 mL/min (ref >60)
GFR calc non Af Amer: >60 mL/min (ref >60)
Glucose, Bld: 94 mg/dL (ref 65–99)
Potassium: 3.7 mmol/L (ref 3.5–5.1)
Sodium: 136 mmol/L (ref 135–145)

## 2015-10-20 LAB — RETICULOCYTES
RBC.: 2.28 MIL/uL — AB (ref 3.87–5.11)
RETIC COUNT ABSOLUTE: 152.8 10*3/uL (ref 19.0–186.0)
Retic Ct Pct: 6.7 % — ABNORMAL HIGH (ref 0.4–3.1)

## 2015-10-20 MED ORDER — ZOLPIDEM TARTRATE 5 MG PO TABS
5.0000 mg | ORAL_TABLET | Freq: Once | ORAL | Status: AC
Start: 2015-10-20 — End: 2015-10-20
  Administered 2015-10-20: 5 mg via ORAL
  Filled 2015-10-20: qty 1

## 2015-10-20 MED ORDER — LACTULOSE 10 GM/15ML PO SOLN
20.0000 g | Freq: Every day | ORAL | Status: DC | PRN
  Administered 2015-10-20: 20 g via ORAL
  Filled 2015-10-20: qty 30

## 2015-10-20 MED ORDER — ZOLPIDEM TARTRATE 5 MG PO TABS
5.0000 mg | ORAL_TABLET | Freq: Every evening | ORAL | Status: DC | PRN
  Administered 2015-10-20 – 2015-10-24 (×3): 5 mg via ORAL
  Filled 2015-10-20 (×3): qty 1

## 2015-10-20 MED ORDER — OXYCODONE HCL 5 MG PO TABS
5.0000 mg | ORAL_TABLET | ORAL | Status: DC
  Administered 2015-10-21 – 2015-10-24 (×20): 5 mg via ORAL
  Filled 2015-10-20 (×22): qty 1

## 2015-10-20 MED ORDER — OXYCODONE-ACETAMINOPHEN 5-325 MG PO TABS
1.0000 | ORAL_TABLET | ORAL | Status: DC
  Administered 2015-10-21 – 2015-10-24 (×20): 1 via ORAL
  Filled 2015-10-20 (×21): qty 1

## 2015-10-20 NOTE — Progress Notes (Signed)
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 Hb [place] with crisis: Pt has used 43 mg with 93/87:demands/deliveries since in last 24 hours. Pain still present in back and chest wall but improved.  Will continue PCA and increase clinician assisted doses at current doses. We have discussed transitioning to oral analgesics tomorrow.  Continue Toradol and decrease IVF to [facility] rate.  2. Leukocytosis: Mild leukocytosis without evidence of infection. Likely related to crisis. 3. Anemia of Chronic Disease: Hb has decreased however patient asymptomatic. 4. Constipation: Pt still has not had a BM despite 2 doses of Magnesium Citrate. Will give Lactulose and encourage ambulation.   Code Status: Full Code Family Communication: N/A Disposition Plan: Not yet ready for discharge  [place].  Pager [phone]. If 7PM-7AM, please contact night-coverage.  10/18/2015, 6:08 PM  LOS: 1 days   Interim History: Pt reports pain 8/10 in legs and 10/10 in back. Pt also c/o sharp pains in chest walls. Is up ambulating in halls. No BM since admission.  Consultants:  None  Procedures:  None  Antibiotics:  None    Objective:  General: Alert, awake, oriented x3, in no acute distress.  Vital Signs: BP 119/65, HR 108, T 98.3 F (36.8 C), temperature source Oral, RR 11, height 5\' 3"  (1.6 m), weight 176 lb 12.9 oz (80.2 kg), last menstrual period 10/07/2015, SpO2 92 %. HEENT: [place]/AT PEERL, EOMI. anicteric Neck: Trachea midline,  no masses, no thyromegal,y no JVD, no carotid bruit OROPHARYNX:  Moist, No exudate/ erythema/lesions.  Heart: Regular rate and rhythm, without murmurs, rubs, gallops, PMI non-displaced, no heaves or thrills on palpation.  Lungs: Clear to auscultation, no wheezing or rhonchi noted. No increased vocal  fremitus resonant to percussion  Abdomen: Soft, nontender, nondistended, positive bowel sounds, no masses no hepatosplenomegaly noted.  Neuro: No focal neurological deficits noted cranial nerves II through XII grossly intact.  Strength at functional baseline in bilateral upper and lower extremities. Musculoskeletal: No warm swelling or erythema around joints, no spinal tenderness noted. Psychiatric: Patient alert and oriented x3, good insight and cognition, good recent to remote recall.    Data Reviewed: Basic Metabolic Panel:  Recent Labs Lab 10/17/15 1002 10/20/15 1134  NA 139 136  K 4.1 3.7  CL 104 100*  CO2 23 28  GLUCOSE 97 94  BUN 20 9  CREATININE 0.77 0.49  CALCIUM 10.4* 9.7   Liver Function Tests:  Recent Labs Lab 10/17/15 1002  AST 42*  ALT 62*  ALKPHOS 82  BILITOT 2.6*  PROT 9.7*  ALBUMIN 5.0   No results for input(s): LIPASE, AMYLASE in the last 168 hours. No results for input(s): AMMONIA in the last 168 hours. CBC:  Recent Labs Lab 10/17/15 1002 10/20/15 1134  WBC 12.2* 13.3*  NEUTROABS 7.4 5.9  HGB 9.2* 6.6*  HCT 26.2* 18.4*  MCV 83.2 80.7  PLT 574* 637*    Studies: Dg Chest 2 View  10/17/2015  CLINICAL DATA:  Sickle cell patient. Recent diagnosis of pneumonia. Shortness of breath. Diarrhea. Productive cough. Chest pain for 1 week. EXAM: CHEST  2 VIEW COMPARISON:  10/07/2015 FINDINGS: The right-sided PICC line is been removed. Minimal linear scarring along the right hemidiaphragm the lungs appear otherwise clear. Cardiac and mediastinal margins appear normal. No pleural effusion identified. IMPRESSION: 1. The lungs currently appear clear  aside from some mild chronic scarring along the right hemidiaphragm. Electronically Signed   By: Van Clines M.D.   On: 10/17/2015 10:58   Dg Chest 2 View  10/07/2015  CLINICAL DATA:  Shortness of breath, productive cough EXAM: CHEST  2 VIEW COMPARISON:  09/23/2015 FINDINGS: Cardiomediastinal silhouette is  stable. No pulmonary edema. Mild perihilar bronchitic changes. There is streaky infiltrate in left upper lobe best seen on lateral view. IMPRESSION: No pulmonary edema. Mild perihilar bronchitic changes. Streaky infiltrate in left upper lobe best seen on lateral view. Electronically Signed   By: Lahoma Crocker M.D.   On: 10/07/2015 16:22   Dg Chest 2 View  09/23/2015  CLINICAL DATA:  Initial evaluation for productive cough since 09/20/2015. EXAM: CHEST  2 VIEW COMPARISON:  Prior study of 09/08/2015. FINDINGS: The cardiac and mediastinal silhouettes are stable in size and contour, and remain within normal limits. The lungs are normally inflated. No airspace consolidation, pleural effusion, or pulmonary edema is identified. There is no pneumothorax. No acute osseous abnormality identified. IMPRESSION: No active cardiopulmonary disease. Electronically Signed   By: Jeannine Boga M.D.   On: 09/23/2015 04:25   Dg Chest Port 1 View  10/07/2015  CLINICAL DATA:  Right PICC placement.  Initial encounter. EXAM: PORTABLE CHEST 1 VIEW COMPARISON:  Chest radiograph performed earlier today at 4:12 p.m. FINDINGS: The patient's right PICC is noted ending about the distal SVC. Mild vascular congestion is noted, with mild right basilar atelectasis. No pleural effusion or pneumothorax is seen. The cardiomediastinal silhouette is borderline normal in size. No acute osseous abnormalities are identified. IMPRESSION: 1. Right PICC noted ending about the distal SVC. 2. Mild vascular congestion, with mild right basilar atelectasis. Electronically Signed   By: Garald Balding M.D.   On: 10/07/2015 23:51    Scheduled Meds: . folic acid  1 mg Oral Daily  . HYDROmorphone   Intravenous 6 times per day  . ketorolac  30 mg Intravenous 4 times per day  . senna-docusate  1 tablet Oral BID    Time spent 25 minutes.

## 2015-10-20 NOTE — Progress Notes (Signed)
Patient reports drinking all of the mg+ citrate but vomiting after finishing the bottle. Has eaten pm meal without complaints. Given prn miralax with pm meds. LBM 12/15. Patient been oob on own in room but no in hallway.

## 2015-10-20 NOTE — Progress Notes (Signed)
CRITICAL VALUE ALERT  Critical value received:  HGB 6.6 Date of notification:  10/20/15  Time of notification:  1205  Critical value read back:YES  Nurse who received alert: Lottie Dawson  MD notified (1st page):  DR  MATTHEWS  Time of first page:  1215  MD notified (2nd page):  Time of second page:  Responding MD:  DR  Zigmund Daniel  Time MD responded:  [id number]

## 2015-10-20 NOTE — Progress Notes (Signed)
Patient had requested ambien. Paged NP on call and orders received. ambien given at [phone] patient observed sleeping around 0215 with pulse showing sats of 88-89 on room air. O2 2l [place] applied. When patient awake sats consistently > 92%.

## 2015-10-21 LAB — CBC
HCT: 19.8 % — ABNORMAL LOW (ref 36.0–46.0)
Hemoglobin: 6.9 g/dL — CL (ref 12.0–15.0)
MCH: 29 pg (ref 26.0–34.0)
MCHC: 34.8 g/dL (ref 30.0–36.0)
MCV: 83.2 fL (ref 78.0–100.0)
PLATELETS: 792 10*3/uL — AB (ref 150–400)
RBC: 2.38 MIL/uL — AB (ref 3.87–5.11)
RDW: 19.3 % — AB (ref 11.5–15.5)
WBC: 24.3 10*3/uL — AB (ref 4.0–10.5)

## 2015-10-21 MED ORDER — LACTULOSE 10 GM/15ML PO SOLN
20.0000 g | Freq: Three times a day (TID) | ORAL | Status: DC
  Administered 2015-10-21 – 2015-10-24 (×6): 20 g via ORAL
  Filled 2015-10-21 (×8): qty 30

## 2015-10-21 MED ORDER — ENOXAPARIN SODIUM 40 MG/0.4ML ~~LOC~~ SOLN
40.0000 mg | SUBCUTANEOUS | Status: DC
  Filled 2015-10-21 (×3): qty 0.4

## 2015-10-21 NOTE — Progress Notes (Signed)
SICKLE CELL SERVICE PROGRESS NOTE  Carmen [name] [id number] DOB: 1991/03/03 DOA: 10/17/2015 PCP: [name], MD  Assessment/Plan: 1. Hb [place] with crisis:  -on PCA , used 38mg  Dilaudid in past 24H, reports pain at 9/10, advised to use more pushes -continue Toradol, IVF at [facility] since yesterday -continue Folic acid  2. Leukocytosis:  -mild, likely reactive, afebrile, monitor  3. Anemia  -acute on chronic, hb today 6.6, baseline around 7.5-7.6, asymptomatic, will repeat and recheck in AM, if continues to trend down will transfuse  4. Constipation -no BM in 7days in mild abd distension, got lactulose last pm and senokot today -change lactulose to scheduled, continue senokot and give enema x1 now -able to tolerate diet  DVT proph: add lovenox  Code Status: Full Code Family Communication: N/A Disposition Plan: Not yet ready for discharge  [facility]  Pager [phone]. If 7PM-7AM, please contact night-coverage.  10/18/2015, 5:40 PM  LOS: 1 days   Interim History: Drinking prune juice to have BM, eating ok, drinking more, pain in back and legs 9/10, sometimes 8, ambulating some in halls  Consultants:  None  Procedures:  None  Antibiotics:  None    Objective:  General: Alert, awake, oriented x3, in no acute distress.  Vital Signs: BP 119/65, HR 108, T 98.3 F (36.8 C), temperature source Oral, RR 11, height 5\' 3"  (1.6 m), weight 176 lb 12.9 oz (80.2 kg), last menstrual period 10/07/2015, SpO2 92 %. HEENT: [place]/AT PEERL, EOMI. anicteric Neck: Trachea midline,  no masses, no thyromegal,y no JVD, no carotid bruit OROPHARYNX:  Moist, No exudate/ erythema/lesions.  Heart: S1S2/RRR , no murmurs, rubs, gallops, Lungs: CTAB  Abdomen: Soft, non tender,  mildly distended, positive bowel sounds, no masses no hepatosplenomegaly noted.  Neuro: No focal neurological deficits noted cranial nerves II through XII grossly intact.  Strength at functional baseline in bilateral  upper and lower extremities. Musculoskeletal:no edema , no joint swelling    Data Reviewed: Basic Metabolic Panel:  Recent Labs Lab 10/17/15 1002 10/20/15 1134  NA 139 136  K 4.1 3.7  CL 104 100*  CO2 23 28  GLUCOSE 97 94  BUN 20 9  CREATININE 0.77 0.49  CALCIUM 10.4* 9.7   Liver Function Tests:  Recent Labs Lab 10/17/15 1002  AST 42*  ALT 62*  ALKPHOS 82  BILITOT 2.6*  PROT 9.7*  ALBUMIN 5.0   No results for input(s): LIPASE, AMYLASE in the last 168 hours. No results for input(s): AMMONIA in the last 168 hours. CBC:  Recent Labs Lab 10/17/15 1002 10/20/15 1134  WBC 12.2* 13.3*  NEUTROABS 7.4 5.9  HGB 9.2* 6.6*  HCT 26.2* 18.4*  MCV 83.2 80.7  PLT 574* 637*    Studies: Dg Chest 2 View  10/17/2015  CLINICAL DATA:  Sickle cell patient. Recent diagnosis of pneumonia. Shortness of breath. Diarrhea. Productive cough. Chest pain for 1 week. EXAM: CHEST  2 VIEW COMPARISON:  10/07/2015 FINDINGS: The right-sided PICC line is been removed. Minimal linear scarring along the right hemidiaphragm the lungs appear otherwise clear. Cardiac and mediastinal margins appear normal. No pleural effusion identified. IMPRESSION: 1. The lungs currently appear clear aside from some mild chronic scarring along the right hemidiaphragm. Electronically Signed   By: Van Clines M.D.   On: 10/17/2015 10:58   Dg Chest 2 View  10/07/2015  CLINICAL DATA:  Shortness of breath, productive cough EXAM: CHEST  2 VIEW COMPARISON:  09/23/2015 FINDINGS: Cardiomediastinal silhouette is stable. No pulmonary edema. Mild perihilar  bronchitic changes. There is streaky infiltrate in left upper lobe best seen on lateral view. IMPRESSION: No pulmonary edema. Mild perihilar bronchitic changes. Streaky infiltrate in left upper lobe best seen on lateral view. Electronically Signed   By: Lahoma Crocker M.D.   On: 10/07/2015 16:22   Dg Chest 2 View  09/23/2015  CLINICAL DATA:  Initial evaluation for productive  cough since 09/20/2015. EXAM: CHEST  2 VIEW COMPARISON:  Prior study of 09/08/2015. FINDINGS: The cardiac and mediastinal silhouettes are stable in size and contour, and remain within normal limits. The lungs are normally inflated. No airspace consolidation, pleural effusion, or pulmonary edema is identified. There is no pneumothorax. No acute osseous abnormality identified. IMPRESSION: No active cardiopulmonary disease. Electronically Signed   By: Jeannine Boga M.D.   On: 09/23/2015 04:25   Dg Chest Port 1 View  10/07/2015  CLINICAL DATA:  Right PICC placement.  Initial encounter. EXAM: PORTABLE CHEST 1 VIEW COMPARISON:  Chest radiograph performed earlier today at 4:12 p.m. FINDINGS: The patient's right PICC is noted ending about the distal SVC. Mild vascular congestion is noted, with mild right basilar atelectasis. No pleural effusion or pneumothorax is seen. The cardiomediastinal silhouette is borderline normal in size. No acute osseous abnormalities are identified. IMPRESSION: 1. Right PICC noted ending about the distal SVC. 2. Mild vascular congestion, with mild right basilar atelectasis. Electronically Signed   By: Garald Balding M.D.   On: 10/07/2015 23:51    Scheduled Meds: . folic acid  1 mg Oral Daily  . HYDROmorphone   Intravenous 6 times per day  . ketorolac  30 mg Intravenous 4 times per day  . lactulose  20 g Oral TID  . oxyCODONE-acetaminophen  1 tablet Oral Q4H   And  . oxyCODONE  5 mg Oral Q4H  . senna-docusate  1 tablet Oral BID    Time spent 25 minutes.

## 2015-10-21 NOTE — Care Management Note (Signed)
Case Management Note  Patient Details  Name: Carmen [name] MRN: [id number] Date of Birth: Nov 14, 1990  Subjective/Objective:     24 yo admitted with [facility]               Action/Plan: From home with mother  Expected Discharge Date:   (unknown)               Expected Discharge Plan:  Home/Self Care  In-House Referral:     Discharge planning Services  CM Consult  Post Acute Care Choice:    Choice offered to:     DME Arranged:    DME Agency:     HH Arranged:    HH Agency:     Status of Service:  In process, will continue to follow  Medicare Important Message Given:    Date Medicare IM Given:    Medicare IM give by:    Date Additional Medicare IM Given:    Additional Medicare Important Message give by:     If discussed at [place] of Stay Meetings, dates discussed:    Additional Comments:  Lynnell Catalan, RN 10/21/2015, 3:10 PM

## 2015-10-22 LAB — CBC
HEMATOCRIT: 21.3 % — AB (ref 36.0–46.0)
HEMOGLOBIN: 7.3 g/dL — AB (ref 12.0–15.0)
MCH: 29 pg (ref 26.0–34.0)
MCHC: 34.3 g/dL (ref 30.0–36.0)
MCV: 84.5 fL (ref 78.0–100.0)
Platelets: 814 10*3/uL — ABNORMAL HIGH (ref 150–400)
RBC: 2.52 MIL/uL — AB (ref 3.87–5.11)
RDW: 19.2 % — AB (ref 11.5–15.5)
WBC: 18.9 10*3/uL — AB (ref 4.0–10.5)

## 2015-10-22 MED ORDER — POLYETHYLENE GLYCOL 3350 17 G PO PACK
17.0000 g | PACK | Freq: Every day | ORAL | Status: DC
  Administered 2015-10-22 – 2015-10-24 (×2): 17 g via ORAL
  Filled 2015-10-22 (×3): qty 1

## 2015-10-22 MED ORDER — PROMETHAZINE HCL 25 MG/ML IJ SOLN
25.0000 mg | Freq: Four times a day (QID) | INTRAMUSCULAR | Status: DC | PRN
  Administered 2015-10-22: 25 mg via INTRAVENOUS
  Filled 2015-10-22: qty 1

## 2015-10-22 NOTE — Progress Notes (Signed)
Patient ID: Carmen [name], female   DOB: 11/23/1990, 24 y.o.   MRN: [id number] [facility] PROGRESS NOTE  Carmen [name] [id number] DOB: 1991-03-26 DOA: 10/17/2015 PCP: [name], MD  Brief narrative:    24 year old female with sickle cell disease and anemia, opiate tolerant who presented to [facility] ED because she was unable to refill medications for pain. She was treating pain with PRN ibuprofen at home but then pain became unbearable mostly in legs and back. She was admitted for sickle cell pain crisis.   Assessment/Plan:    [facility] with crisis - Pain is 10/10 this am - Continue dilaudid PCA for next 24 hours - Continue oxycodone 5 mg every 4 hours and percocet every 4 hours  - Continue IV fluids for hydration  Leukocytosis - Likely reactive - No evidence of acute infectious process  Sickle cell anemia - Hemoglobin stable at 7.3  Constipation - Added lactulose. She is already on senna - Add miralax    DVT Prophylaxis  - Lovenox sub Q  Code Status: Full.  Family Communication:  plan of care discussed with the patient Disposition Plan: Home once pain better controlled   IV access:  Peripheral IV  Procedures and diagnostic studies:    No results found.  Medical Consultants:  None  Other Consultants:  None   IAnti-Infectives:   None    Leisa Lenz, MD  [facility] Pager [phone]  Time spent in minutes: 25 minutes  If 7PM-7AM, please contact night-coverage [email or web address] Password [facility] 10/22/2015, 4:27 PM   LOS: 5 days    HPI/Subjective: No acute overnight events. Patient reports pain still not controlled.   Objective: Filed Vitals:   10/22/15 0800 10/22/15 1017 10/22/15 1211 10/22/15 1304  BP:  123/81  130/89  Pulse:  102  117  Temp:  98.2 F (36.8 C)  97.5 F (36.4 C)  TempSrc:  Oral  Oral  Resp: 16 11 15 12   Height:      Weight:      SpO2: 98% 99% 100% 98%    Intake/Output Summary (Last 24 hours) at 10/22/15  1627 Last data filed at 10/22/15 0900  Gross per 24 hour  Intake    360 ml  Output    300 ml  Net     60 ml    Exam:   General:  Pt is alert, not in acute distress  Cardiovascular: Regular rate and rhythm, S1/S2 appreciated   Respiratory: Clear to auscultation bilaterally, no wheezing, no crackles, no rhonchi  Abdomen: Soft, non tender, non distended, bowel sounds present  Extremities: No edema, pulses DP and PT palpable bilaterally  Neuro: follows commands appropriately, oriented to time, place and person   Data Reviewed: Basic Metabolic Panel:  Recent Labs Lab 10/17/15 1002 10/20/15 1134  NA 139 136  K 4.1 3.7  CL 104 100*  CO2 23 28  GLUCOSE 97 94  BUN 20 9  CREATININE 0.77 0.49  CALCIUM 10.4* 9.7   Liver Function Tests:  Recent Labs Lab 10/17/15 1002  AST 42*  ALT 62*  ALKPHOS 82  BILITOT 2.6*  PROT 9.7*  ALBUMIN 5.0   No results for input(s): LIPASE, AMYLASE in the last 168 hours. No results for input(s): AMMONIA in the last 168 hours. CBC:  Recent Labs Lab 10/17/15 1002 10/20/15 1134 10/21/15 2106 10/22/15 0355  WBC 12.2* 13.3* 24.3* 18.9*  NEUTROABS 7.4 5.9  --   --   HGB 9.2* 6.6* 6.9* 7.3*  HCT 26.2* 18.4* 19.8* 21.3*  MCV 83.2 80.7 83.2 84.5  PLT 574* 637* 792* 814*   Cardiac Enzymes: No results for input(s): CKTOTAL, CKMB, CKMBINDEX, TROPONINI in the last 168 hours. BNP: Invalid input(s): POCBNP CBG: No results for input(s): GLUCAP in the last 168 hours.  No results found for this or any previous visit (from the past 240 hour(s)).   Scheduled Meds: . enoxaparin (LOVENOX) injection  40 mg Subcutaneous Q24H  . folic acid  1 mg Oral Daily  . HYDROmorphone   Intravenous 6 times per day  . lactulose  20 g Oral TID  . oxyCODONE-acetaminophen  1 tablet Oral Q4H   And  . oxyCODONE  5 mg Oral Q4H  . senna-docusate  1 tablet Oral BID   Continuous Infusions: . sodium chloride 10 mL/hr at 10/21/15 1748

## 2015-10-23 MED ORDER — HYDROMORPHONE HCL 2 MG/ML IJ SOLN
2.0000 mg | INTRAMUSCULAR | Status: DC | PRN
  Administered 2015-10-23 – 2015-10-24 (×3): 2 mg via INTRAVENOUS
  Filled 2015-10-23 (×4): qty 1

## 2015-10-23 MED ORDER — HYDROMORPHONE HCL 2 MG/ML IJ SOLN: 2.0000 mg | INTRAMUSCULAR | Status: DC | PRN

## 2015-10-23 MED ORDER — HYDROMORPHONE HCL 1 MG/ML IJ SOLN
1.0000 mg | Freq: Once | INTRAMUSCULAR | Status: AC
Start: 2015-10-23 — End: 2015-10-23
  Administered 2015-10-23: 1 mg via INTRAVENOUS

## 2015-10-23 MED ORDER — HYDROMORPHONE HCL 1 MG/ML IJ SOLN
1.0000 mg | INTRAMUSCULAR | Status: AC
  Administered 2015-10-23: 1 mg via INTRAVENOUS
  Filled 2015-10-23: qty 1

## 2015-10-23 MED ORDER — HYDROMORPHONE HCL 1 MG/ML IJ SOLN
INTRAMUSCULAR | Status: AC
  Filled 2015-10-23: qty 1

## 2015-10-23 NOTE — Progress Notes (Signed)
Carmen [name]  Carmen [name] [id number] DOB: 26-Apr-1991 DOA: 10/17/2015 PCP: [name], MD  Assessment/Plan: Active Problems:   Sickle cell crisis ([place])   Hb-SS disease with crisis ([place])  1. Hb [place] with crisis: Pt has been up and ambulating in the last 24 hours. We have discussed her progression of pain relief and agreed to progress to oral analgesics and clinician assisted doses for breakthrough pain in anticipation of discharge home tomorrow. Toradol completed will resume Ibuprofen.   2. Leukocytosis: Pt continues to have an elevated WBC. I will check a CXR to see if she has developed an acute pulmonary process in the interim. Check CBC with diff to morrow. 3. Anemia of Chronic Disease: Hb has decreased however patient asymptomatic. 4. Constipation: Pt had an enema with good results.   Code Status: Full Code Family Communication: N/A Disposition Plan: Anticipate discharge tomorrow.  Carmen Veronica A.  Pager [phone]. If 7PM-7AM, please contact night-coverage.  10/18/2015, 5:34 PM  LOS: 1 days   Interim History: Pt reports pain at 6-7/10 in back and legs. Pt also c/o sharp pains in chest walls. Is up ambulating in halls. .  Consultants:  None  Procedures:  None  Antibiotics:  None    Objective:  General: Alert, awake, oriented x3, well appearing.  Vital Signs:BP 125/77, HR 108, T 98.7 F (37.1 C), temperature source Oral, RR 10, height 5\' 3"  (1.6 m), weight 187 lb 6.3 oz (85 kg), last menstrual period 10/07/2015, SpO2 99 % on RA. HEENT: [place]/AT PEERL, EOMI. anicteric Neck: Trachea midline,  no masses, no thyromegal,y no JVD, no carotid bruit OROPHARYNX:  Moist, No exudate/ erythema/lesions.  Heart: Regular rate and rhythm, without murmurs, rubs, gallops, PMI non-displaced, no heaves or thrills on palpation.  Lungs: Clear to auscultation, no wheezing or rhonchi noted. No increased vocal fremitus resonant to percussion  Abdomen: Soft,  nontender, nondistended, positive bowel sounds, no masses no hepatosplenomegaly noted.  Neuro: No focal neurological deficits noted cranial nerves II through XII grossly intact.  Strength at functional baseline in bilateral upper and lower extremities. Musculoskeletal: No warm swelling or erythema around joints, no spinal tenderness noted. Psychiatric: Patient alert and oriented x3, good insight and cognition, good recent to remote recall.    Data Reviewed: Basic Metabolic Panel:  Recent Labs Lab 10/17/15 1002 10/20/15 1134  NA 139 136  K 4.1 3.7  CL 104 100*  CO2 23 28  GLUCOSE 97 94  BUN 20 9  CREATININE 0.77 0.49  CALCIUM 10.4* 9.7   Liver Function Tests:  Recent Labs Lab 10/17/15 1002  AST 42*  ALT 62*  ALKPHOS 82  BILITOT 2.6*  PROT 9.7*  ALBUMIN 5.0   No results for input(s): LIPASE, AMYLASE in the last 168 hours. No results for input(s): AMMONIA in the last 168 hours. CBC:  Recent Labs Lab 10/17/15 1002 10/20/15 1134 10/21/15 2106 10/22/15 0355  WBC 12.2* 13.3* 24.3* 18.9*  NEUTROABS 7.4 5.9  --   --   HGB 9.2* 6.6* 6.9* 7.3*  HCT 26.2* 18.4* 19.8* 21.3*  MCV 83.2 80.7 83.2 84.5  PLT 574* 637* 792* 814*    Studies: Dg Chest 2 View  10/17/2015  CLINICAL DATA:  Sickle cell patient. Recent diagnosis of pneumonia. Shortness of breath. Diarrhea. Productive cough. Chest pain for 1 week. EXAM: CHEST  2 VIEW COMPARISON:  10/07/2015 FINDINGS: The right-sided PICC line is been removed. Minimal linear scarring along the right hemidiaphragm the lungs appear otherwise clear. Cardiac  and mediastinal margins appear normal. No pleural effusion identified. IMPRESSION: 1. The lungs currently appear clear aside from some mild chronic scarring along the right hemidiaphragm. Electronically Signed   By: Van Clines M.D.   On: 10/17/2015 10:58   Dg Chest 2 View  10/07/2015  CLINICAL DATA:  Shortness of breath, productive cough EXAM: CHEST  2 VIEW COMPARISON:   09/23/2015 FINDINGS: Cardiomediastinal silhouette is stable. No pulmonary edema. Mild perihilar bronchitic changes. There is streaky infiltrate in left upper lobe best seen on lateral view. IMPRESSION: No pulmonary edema. Mild perihilar bronchitic changes. Streaky infiltrate in left upper lobe best seen on lateral view. Electronically Signed   By: Lahoma Crocker M.D.   On: 10/07/2015 16:22   Dg Chest Port 1 View  10/07/2015  CLINICAL DATA:  Right PICC placement.  Initial encounter. EXAM: PORTABLE CHEST 1 VIEW COMPARISON:  Chest radiograph performed earlier today at 4:12 p.m. FINDINGS: The patient's right PICC is noted ending about the distal SVC. Mild vascular congestion is noted, with mild right basilar atelectasis. No pleural effusion or pneumothorax is seen. The cardiomediastinal silhouette is borderline normal in size. No acute osseous abnormalities are identified. IMPRESSION: 1. Right PICC noted ending about the distal SVC. 2. Mild vascular congestion, with mild right basilar atelectasis. Electronically Signed   By: Garald Balding M.D.   On: 10/07/2015 23:51    Scheduled Meds: . enoxaparin (LOVENOX) injection  40 mg Subcutaneous Q24H  . folic acid  1 mg Oral Daily  . lactulose  20 g Oral TID  . oxyCODONE-acetaminophen  1 tablet Oral Q4H   And  . oxyCODONE  5 mg Oral Q4H  . polyethylene glycol  17 g Oral Daily  . senna-docusate  1 tablet Oral BID    Time spent 25 minutes.
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
## 2015-10-24 LAB — CBC WITH DIFFERENTIAL/PLATELET
BASOS ABS: 0 10*3/uL (ref 0.0–0.1)
BASOS PCT: 0 %
EOS PCT: 2 %
Eosinophils Absolute: 0.3 10*3/uL (ref 0.0–0.7)
HEMATOCRIT: 19.5 % — AB (ref 36.0–46.0)
HEMOGLOBIN: 6.7 g/dL — AB (ref 12.0–15.0)
LYMPHS PCT: 31 %
Lymphs Abs: 4.2 10*3/uL — ABNORMAL HIGH (ref 0.7–4.0)
MCH: 29.6 pg (ref 26.0–34.0)
MCHC: 34.4 g/dL (ref 30.0–36.0)
MCV: 86.3 fL (ref 78.0–100.0)
MONO ABS: 1.8 10*3/uL — AB (ref 0.1–1.0)
MONOS PCT: 13 %
NEUTROS PCT: 54 %
NRBC: 12 /100{WBCs} — AB
Neutro Abs: 7.3 10*3/uL (ref 1.7–7.7)
PLATELETS: 748 10*3/uL — AB (ref 150–400)
RBC: 2.26 MIL/uL — ABNORMAL LOW (ref 3.87–5.11)
RDW: 19.8 % — AB (ref 11.5–15.5)
WBC: 13.6 10*3/uL — ABNORMAL HIGH (ref 4.0–10.5)

## 2015-10-24 LAB — RETICULOCYTES
RBC.: 2.26 MIL/uL — ABNORMAL LOW (ref 3.87–5.11)
RETIC COUNT ABSOLUTE: 418.1 10*3/uL — AB (ref 19.0–186.0)
RETIC CT PCT: 18.5 % — AB (ref 0.4–3.1)

## 2015-10-24 LAB — BASIC METABOLIC PANEL
ANION GAP: 10 (ref 5–15)
BUN: <5 mg/dL — ABNORMAL LOW (ref 6–20)
CALCIUM: 9.2 mg/dL (ref 8.9–10.3)
CO2: 27 mmol/L (ref 22–32)
Chloride: 102 mmol/L (ref 101–111)
Creatinine, Ser: 0.45 mg/dL (ref 0.44–1.00)
GLUCOSE: 97 mg/dL (ref 65–99)
Potassium: 3.5 mmol/L (ref 3.5–5.1)
SODIUM: 139 mmol/L (ref 135–145)

## 2015-10-24 MED ORDER — HYDROMORPHONE HCL 2 MG/ML IJ SOLN
3.0000 mg | Freq: Once | INTRAMUSCULAR | Status: AC
  Administered 2015-10-24: 3 mg via INTRAVENOUS

## 2015-10-24 MED ORDER — HYDROMORPHONE HCL 2 MG/ML IJ SOLN
INTRAMUSCULAR | Status: AC
  Administered 2015-10-24: 3 mg via INTRAVENOUS
  Filled 2015-10-24: qty 2
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
## 2015-10-24 NOTE — Progress Notes (Signed)
CRITICAL VALUE ALERT  Critical value received:  Hgb 6.7  Date of notification:  12/23  Time of notification: 0625  Critical value read back:Yes.    Nurse who received alert:  Kolyn Rozario  MD notified (1st page): On call, Rogue Bussing  Time of first page: 0625  MD notified (2nd page):  Time of second page:  Responding MD:  Waiting for response  Time MD responded:  Waiting

## 2015-11-10 ENCOUNTER — Emergency Department (HOSPITAL_COMMUNITY): Payer: Medicaid Other

## 2015-11-10 ENCOUNTER — Inpatient Hospital Stay (HOSPITAL_COMMUNITY)
Admission: EM | Admit: 2015-11-10 | Discharge: 2015-11-17 | DRG: 812 | Disposition: A | Discharge status: Home / Self Care | Payer: Medicaid Other | Attending: Internal Medicine | Admitting: Internal Medicine

## 2015-11-10 ENCOUNTER — Encounter (HOSPITAL_COMMUNITY): Payer: Self-pay | Admitting: Emergency Medicine

## 2015-11-10 DIAGNOSIS — Z8249 Family history of ischemic heart disease and other diseases of the circulatory system: Secondary | ICD-10-CM

## 2015-11-10 DIAGNOSIS — R079 Chest pain, unspecified: Secondary | ICD-10-CM | POA: Diagnosis present

## 2015-11-10 DIAGNOSIS — D5700 Hb-SS disease with crisis, unspecified: Principal | ICD-10-CM | POA: Diagnosis present

## 2015-11-10 DIAGNOSIS — K5900 Constipation, unspecified: Secondary | ICD-10-CM | POA: Diagnosis present

## 2015-11-10 DIAGNOSIS — F419 Anxiety disorder, unspecified: Secondary | ICD-10-CM | POA: Diagnosis present

## 2015-11-10 DIAGNOSIS — G4700 Insomnia, unspecified: Secondary | ICD-10-CM | POA: Diagnosis present

## 2015-11-10 DIAGNOSIS — R0789 Other chest pain: Secondary | ICD-10-CM

## 2015-11-10 DIAGNOSIS — R000 Tachycardia, unspecified: Secondary | ICD-10-CM | POA: Diagnosis present

## 2015-11-10 DIAGNOSIS — D72829 Elevated white blood cell count, unspecified: Secondary | ICD-10-CM | POA: Diagnosis present

## 2015-11-10 LAB — COMPREHENSIVE METABOLIC PANEL
ALBUMIN: 4.4 g/dL (ref 3.5–5.0)
ALT: 12 U/L — ABNORMAL LOW (ref 14–54)
ANION GAP: 9 (ref 5–15)
AST: 16 U/L (ref 15–41)
Alkaline Phosphatase: 54 U/L (ref 38–126)
BILIRUBIN TOTAL: 1 mg/dL (ref 0.3–1.2)
BUN: 8 mg/dL (ref 6–20)
CO2: 25 mmol/L (ref 22–32)
Calcium: 9.6 mg/dL (ref 8.9–10.3)
Chloride: 109 mmol/L (ref 101–111)
Creatinine, Ser: 0.65 mg/dL (ref 0.44–1.00)
GFR calc non Af Amer: >60 mL/min (ref >60)
GLUCOSE: 86 mg/dL (ref 65–99)
POTASSIUM: 3.6 mmol/L (ref 3.5–5.1)
SODIUM: 143 mmol/L (ref 135–145)
TOTAL PROTEIN: 7.7 g/dL (ref 6.5–8.1)

## 2015-11-10 LAB — CBC WITH DIFFERENTIAL/PLATELET
BASOS ABS: 0 10*3/uL (ref 0.0–0.1)
Basophils Relative: 0 %
EOS ABS: 0.1 10*3/uL (ref 0.0–0.7)
Eosinophils Relative: 1 %
HEMATOCRIT: 27.4 % — AB (ref 36.0–46.0)
HEMOGLOBIN: 9.4 g/dL — AB (ref 12.0–15.0)
LYMPHS PCT: 45 %
Lymphs Abs: 3.7 10*3/uL (ref 0.7–4.0)
MCH: 29.6 pg (ref 26.0–34.0)
MCHC: 34.3 g/dL (ref 30.0–36.0)
MCV: 86.2 fL (ref 78.0–100.0)
MONOS PCT: 10 %
Monocytes Absolute: 0.8 10*3/uL (ref 0.1–1.0)
NEUTROS ABS: 3.7 10*3/uL (ref 1.7–7.7)
NEUTROS PCT: 44 %
Platelets: 599 10*3/uL — ABNORMAL HIGH (ref 150–400)
RBC: 3.18 MIL/uL — ABNORMAL LOW (ref 3.87–5.11)
RDW: 15.8 % — ABNORMAL HIGH (ref 11.5–15.5)
WBC: 8.3 10*3/uL (ref 4.0–10.5)

## 2015-11-10 LAB — URINALYSIS, ROUTINE W REFLEX MICROSCOPIC
BILIRUBIN URINE: NEGATIVE
Glucose, UA: NEGATIVE mg/dL
HGB URINE DIPSTICK: NEGATIVE
Ketones, ur: NEGATIVE mg/dL
Leukocytes, UA: NEGATIVE
Nitrite: NEGATIVE
PH: 6.5 (ref 5.0–8.0)
Protein, ur: NEGATIVE mg/dL
SPECIFIC GRAVITY, URINE: 1.015 (ref 1.005–1.030)

## 2015-11-10 MED ORDER — HYDROMORPHONE HCL 2 MG/ML IJ SOLN
3.1500 mg | Freq: Once | INTRAMUSCULAR | Status: AC
  Administered 2015-11-10: 3.2 mg via INTRAVENOUS
  Filled 2015-11-10: qty 2

## 2015-11-10 MED ORDER — HYDROMORPHONE HCL 2 MG/ML IJ SOLN
2.6000 mg | Freq: Once | INTRAMUSCULAR | Status: AC
  Administered 2015-11-10: 2.6 mg via INTRAVENOUS
  Filled 2015-11-10: qty 2

## 2015-11-10 MED ORDER — HYDROMORPHONE HCL 2 MG/ML IJ SOLN
2.0000 mg | Freq: Once | INTRAMUSCULAR | Status: AC
  Administered 2015-11-10: 2 mg via INTRAVENOUS
  Filled 2015-11-10: qty 1

## 2015-11-10 MED ORDER — KETOROLAC TROMETHAMINE 15 MG/ML IJ SOLN
30.0000 mg | Freq: Once | INTRAMUSCULAR | Status: AC
  Administered 2015-11-10: 30 mg via INTRAVENOUS
  Filled 2015-11-10: qty 2

## 2015-11-10 MED ORDER — IOHEXOL 350 MG/ML SOLN
100.0000 mL | Freq: Once | INTRAVENOUS | Status: AC | PRN
  Administered 2015-11-10: 100 mL via INTRAVENOUS

## 2015-11-10 MED ORDER — ONDANSETRON HCL 4 MG/2ML IJ SOLN
4.0000 mg | Freq: Once | INTRAMUSCULAR | Status: AC
  Administered 2015-11-10: 4 mg via INTRAVENOUS
  Filled 2015-11-10: qty 2

## 2015-11-10 MED ORDER — SODIUM CHLORIDE 0.45 % IV SOLN
INTRAVENOUS | Status: DC
Start: 2015-11-10 — End: 2015-11-11
  Administered 2015-11-10 (×2): via INTRAVENOUS

## 2015-11-10 NOTE — H&P (Signed)
PCP:  [name], MD    Referring provider Etta Quill   Chief Complaint:   Chest pain and pain under breasts and legs 3 days  HPI: Carmen [name] is a 25 y.o. female   has a past medical history of Sickle cell crisis ([place]); Headache(784.0); and Abortion.   Presented with pain and leg pain consistent with prior sickle cell crisis. She states the pain started 3 days ago she's been using home dose of Percocet without any relief. She went to see her PCP and got prescriptions from them.  Bilirubin appears to be normal. In emergency department she was given Dilaudid 2 mg IV followed by another dose of 2.6 mg IV and then another dose is 3.2 mg IV is out significant relief and was given Toradol 30 mg IV. After each point hospitalist was called on admission Presented to emergency department chest x-ray showed no infiltrates. White blood cell count within normal limits Patient has history of frequent admissions secondary to sickle cell crisis. Was discharged on 23rd of December 2016.  Hospitalist was called for admission for sickle cell crisis  Review of Systems:    Pertinent positives include:  chest pain, leg pain  Constitutional:  No weight loss, night sweats, Fevers, chills, fatigue, weight loss  HEENT:  No headaches, Difficulty swallowing,Tooth/dental problems,Sore throat,  No sneezing, itching, ear ache, nasal congestion, post nasal drip,  Cardio-vascular:  NoOrthopnea, PND, anasarca, dizziness, palpitations.no Bilateral lower extremity swelling  GI:  No heartburn, indigestion, abdominal pain, nausea, vomiting, diarrhea, change in bowel habits, loss of appetite, melena, blood in stool, hematemesis Resp:  no shortness of breath at rest. No dyspnea on exertion, No excess mucus, no productive cough, No non-productive cough, No coughing up of blood.No change in color of mucus.No wheezing. Skin:  no rash or lesions. No jaundice GU:  no dysuria, change in color of urine, no  urgency or frequency. No straining to urinate.  No flank pain.  Musculoskeletal:  No joint pain or no joint swelling. No decreased range of motion. No back pain.  Psych:  No change in mood or affect. No depression or anxiety. No memory loss.  Neuro: no localizing neurological complaints, no tingling, no weakness, no double vision, no gait abnormality, no slurred speech, no confusion  Otherwise ROS are negative except for above, 10 systems were reviewed  Past Medical History: Past Medical History  Diagnosis Date  . Sickle cell crisis ([place])   . Headache(784.0)   . Abortion     05/2012   Past Surgical History  Procedure Laterality Date  . Splenectomy    . Cholecystectomy N/A 11/30/2014    Procedure: LAPAROSCOPIC CHOLECYSTECTOMY SINGLE SITE WITH INTRAOPERATIVE CHOLANGIOGRAM;  Surgeon: Michael Boston, MD;  Location: [facility];  Service: General;  Laterality: N/A;     Medications: Prior to Admission medications   Medication Sig Start Date End Date Taking? Authorizing Provider  naproxen (NAPROSYN) 375 MG tablet Take 375 mg by mouth 2 (two) times daily with a meal. 10/30/15  Yes Historical Provider, MD  oxyCODONE-acetaminophen (PERCOCET) 10-325 MG tablet Take 1 tablet by mouth every 4 (four) hours as needed for pain. Hold until Oral Dilaudid completed. Then resume as prescribed. 10/14/15  Yes Leana Gamer, MD  cyclobenzaprine (FLEXERIL) 10 MG tablet Take 1 tablet (10 mg total) by mouth 3 (three) times daily as needed for muscle spasms. Patient not taking: Reported on 11/10/2015 10/14/15   Leana Gamer, MD  folic acid (FOLVITE) 1 MG tablet  Take 1 tablet (1 mg total) by mouth daily. Patient not taking: Reported on 10/17/2015 09/14/15   Elwyn Reach, MD  HYDROmorphone (DILAUDID) 4 MG tablet Take 1 tablet (4 mg total) by mouth every 4 (four) hours. Patient not taking: Reported on 10/17/2015 10/14/15   Leana Gamer, MD  potassium chloride SA (K-DUR,KLOR-CON) 20 MEQ tablet Take  1 tablet (20 mEq total) by mouth 2 (two) times daily. Patient not taking: Reported on 10/07/2015 09/23/15   Rolland Porter, MD    Allergies:  No Known Allergies  Social History:  Ambulatory  Independently  Lives at home  With family     reports that she has never smoked. She has never used smokeless tobacco. She reports that she does not drink alcohol or use illicit drugs.     Family History: family history includes Arthritis in her sister; Heart disease in her maternal aunt and maternal uncle; Hypertension in her mother; Kidney disease in her sister; Sickle cell anemia in her sister and sister; Sickle cell trait in her sister.    Physical Exam: Patient Vitals for the past 24 hrs:  BP Temp Temp src Pulse Resp SpO2 Weight  11/10/15 2048 115/76 mmHg - - 102 26 99 % -  11/10/15 2004 - - - - - - 81.647 kg (180 lb)  11/10/15 1927 - - - - - - (!) 180 kg (396 lb 13.3 oz)  11/10/15 1924 135/93 mmHg - - 77 18 99 % -  11/10/15 1333 127/82 mmHg 97.8 F (36.6 C) Oral 101 14 97 % -    1. General:  in No Acute distress 2. Psychological: Alert and   Oriented 3. Head/ENT:    Dry Mucous Membranes                          Head Non traumatic, neck supple                           Poor Dentition 4. SKIN:     decreased Skin turgor,  Skin clean Dry and intact no rash 5. Heart: Regular rate and rhythm no Murmur, Rub or gallop 6. Lungs: Clear to auscultation bilaterally, no wheezes or crackles   7. Abdomen: Soft, non-tender, Non distended 8. Lower extremities: no clubbing, cyanosis, or edema 9. Neurologically Grossly intact, moving all 4 extremities equally 10. MSK: Normal range of motion  body mass index is 31.89 kg/(m^2).   Labs on Admission:   Results for orders placed or performed during the hospital encounter of 11/10/15 (from the past 24 hour(s))  Comprehensive metabolic panel     Status: Abnormal   Collection Time: 11/10/15  2:00 PM  Result Value Ref Range   Sodium 143 135 - 145 mmol/L     Potassium 3.6 3.5 - 5.1 mmol/L   Chloride 109 101 - 111 mmol/L   CO2 25 22 - 32 mmol/L   Glucose, Bld 86 65 - 99 mg/dL   BUN 8 6 - 20 mg/dL   Creatinine, Ser 0.65 0.44 - 1.00 mg/dL   Calcium 9.6 8.9 - 10.3 mg/dL   Total Protein 7.7 6.5 - 8.1 g/dL   Albumin 4.4 3.5 - 5.0 g/dL   AST 16 15 - 41 U/L   ALT 12 (L) 14 - 54 U/L   Alkaline Phosphatase 54 38 - 126 U/L   Total Bilirubin 1.0 0.3 - 1.2 mg/dL   GFR calc  non Af Amer >60 >60 mL/min   GFR calc Af Amer >60 >60 mL/min   Anion gap 9 5 - 15  CBC with Differential     Status: Abnormal   Collection Time: 11/10/15  2:00 PM  Result Value Ref Range   WBC 8.3 4.0 - 10.5 K/uL   RBC 3.18 (L) 3.87 - 5.11 MIL/uL   Hemoglobin 9.4 (L) 12.0 - 15.0 g/dL   HCT 27.4 (L) 36.0 - 46.0 %   MCV 86.2 78.0 - 100.0 fL   MCH 29.6 26.0 - 34.0 pg   MCHC 34.3 30.0 - 36.0 g/dL   RDW 15.8 (H) 11.5 - 15.5 %   Platelets 599 (H) 150 - 400 K/uL   Neutrophils Relative % 44 %   Lymphocytes Relative 45 %   Monocytes Relative 10 %   Eosinophils Relative 1 %   Basophils Relative 0 %   Neutro Abs 3.7 1.7 - 7.7 K/uL   Lymphs Abs 3.7 0.7 - 4.0 K/uL   Monocytes Absolute 0.8 0.1 - 1.0 K/uL   Eosinophils Absolute 0.1 0.0 - 0.7 K/uL   Basophils Absolute 0.0 0.0 - 0.1 K/uL   RBC Morphology POLYCHROMASIA PRESENT   Urinalysis, Routine w reflex microscopic (not at [facility])     Status: Abnormal   Collection Time: 11/10/15  7:17 PM  Result Value Ref Range   Color, Urine YELLOW YELLOW   APPearance CLOUDY (A) CLEAR   Specific Gravity, Urine 1.015 1.005 - 1.030   pH 6.5 5.0 - 8.0   Glucose, UA NEGATIVE NEGATIVE mg/dL   Hgb urine dipstick NEGATIVE NEGATIVE   Bilirubin Urine NEGATIVE NEGATIVE   Ketones, ur NEGATIVE NEGATIVE mg/dL   Protein, ur NEGATIVE NEGATIVE mg/dL   Nitrite NEGATIVE NEGATIVE   Leukocytes, UA NEGATIVE NEGATIVE    UA no evidence of UTI  No results found for: HGBA1C  Estimated Creatinine Clearance: 111.6 mL/min (by C-G formula based on Cr of  0.65).  BNP (last 3 results) No results for input(s): PROBNP in the last 8760 hours.  Other results:  I have pearsonaly reviewed this: ECG REPORT  Rate: 76  Rhythm: Sinus rhythm ST&T Change: No ischemic changes QTC 420  Filed Weights   11/10/15 1927 11/10/15 2004  Weight: 180 kg (396 lb 13.3 oz) 81.647 kg (180 lb)     Cultures:    Component Value Date/Time   SDES URINE, RANDOM 10/07/2015 2144   [place] NONE 10/07/2015 2144   CULT  10/07/2015 2000    NO GROWTH 5 DAYS Performed at [facility] 10/09/2015 FINAL 10/07/2015 2144     Radiological Exams on Admission: Dg Chest 2 View  11/10/2015  CLINICAL DATA:  Pain.  No reported injury. EXAM: CHEST  2 VIEW COMPARISON:  10/17/2015. FINDINGS: Mediastinum hilar structures normal. Bibasilar pleural parenchymal scarring and/or atelectasis again noted. No pleural effusion or pneumothorax. Heart size normal. No acute bony abnormality. IMPRESSION: Mild bibasilar pleural-parenchymal scarring and/or subsegmental atelectasis. No acute abnormality. No acute bony abnormality. Electronically Signed   By: Marcello Moores  Register   On: 11/10/2015 14:00    Chart has been reviewed  Family not  at  Bedside   Assessment/Plan  25 year old female with known history of sickle cell disease presents with typical sickle cell discomfort. Reporting chest pains with pleuritic component. CT scan of the chest unremarkable for PE no evidence of acute chest  Present on Admission:  . Sickle cell disease with crisis ([place]) - - will admit per  sickle cell protocol, control pain, hydrate, provide oxygen.    Transfuse as needed if Hg drops significantly below baseline. If develops fever or respiratory symptoms would evaluate for acute chest and initiate antibiotics as needed. . Chest pain - likely noncardiac most left is secondary to sickle cell disease. We'll observe 24 hours on telemetry would discontinue if no cardiac events   sickle cell  team to assume care in the morning   Prophylaxis:   Lovenox   CODE STATUS:  FULL CODE  as per patient   Disposition:    To home once workup is complete and patient is stable  Other plan as per orders.  I have spent a total of 55 min on this admission  Nasean Zapf 11/10/2015, 12:26 AM    [facility]  Pager [phone]   after 2 AM please page floor coverage PA If 7AM-7PM, please contact the day team taking care of the patient  [email or web address]  Password [email or web address]

## 2015-11-10 NOTE — ED Notes (Signed)
Patient presents with SS pain in chest under breasts and bilateral legs x3 days. Rate pain 10/10.

## 2015-11-10 NOTE — ED Provider Notes (Signed)
CSN: [id number]     Arrival date & time 11/10/15  1311 History   First MD Initiated Contact with Patient 11/10/15 1622     Chief Complaint  Patient presents with  . Sickle Cell Pain Crisis  . Chest Pain     (Consider location/radiation/quality/duration/timing/severity/associated sxs/prior Treatment) HPI Comments: Patient with history of SCD presents to ED with increasing pain in her extremities and back, consistent with prior episodes of sickle cell pain.  Patient has been using percocet at home without relief. She denies fever, but endorses occasional non-productive cough.  Patient is a 25 y.o. female presenting with sickle cell pain and chest pain. The history is provided by the patient. No language interpreter was used.  Sickle Cell Pain Crisis Location:  Back, lower extremity and chest Severity:  Severe Onset quality:  Gradual Similar to previous crisis episodes: yes   Timing:  Intermittent Progression:  Worsening History of pulmonary emboli: no   Associated symptoms: chest pain and cough   Associated symptoms: no fever   Chest Pain Associated symptoms: cough   Associated symptoms: no fever     Past Medical History  Diagnosis Date  . Sickle cell crisis ([place])   . Headache(784.0)   . Abortion     05/2012   Past Surgical History  Procedure Laterality Date  . Splenectomy    . Cholecystectomy N/A 11/30/2014    Procedure: LAPAROSCOPIC CHOLECYSTECTOMY SINGLE SITE WITH INTRAOPERATIVE CHOLANGIOGRAM;  Surgeon: Michael Boston, MD;  Location: [facility];  Service: General;  Laterality: N/A;   Family History  Problem Relation Age of Onset  . Hypertension Mother   . Sickle cell anemia Sister   . Kidney disease Sister     Lupus  . Arthritis Sister   . Sickle cell anemia Sister   . Sickle cell trait Sister   . Heart disease Maternal Aunt   . Heart disease Maternal Uncle    Social History  Substance Use Topics  . Smoking status: Never Smoker   . Smokeless tobacco: Never Used  .  Alcohol Use: No   OB History    Gravida Para Term Preterm AB TAB SAB Ectopic Multiple Living   1    1          Review of Systems  Constitutional: Negative for fever.  Respiratory: Positive for cough.   Cardiovascular: Positive for chest pain.  Musculoskeletal: Positive for myalgias and arthralgias.  All other systems reviewed and are negative.     Allergies  Review of patient's allergies indicates no known allergies.  Home Medications   Prior to Admission medications   Medication Sig Start Date End Date Taking? Authorizing Provider  naproxen (NAPROSYN) 375 MG tablet Take 375 mg by mouth 2 (two) times daily with a meal. 10/30/15  Yes Historical Provider, MD  oxyCODONE-acetaminophen (PERCOCET) 10-325 MG tablet Take 1 tablet by mouth every 4 (four) hours as needed for pain. Hold until Oral Dilaudid completed. Then resume as prescribed. 10/14/15  Yes Leana Gamer, MD  cyclobenzaprine (FLEXERIL) 10 MG tablet Take 1 tablet (10 mg total) by mouth 3 (three) times daily as needed for muscle spasms. Patient not taking: Reported on 11/10/2015 10/14/15   Leana Gamer, MD  folic acid (FOLVITE) 1 MG tablet Take 1 tablet (1 mg total) by mouth daily. Patient not taking: Reported on 10/17/2015 09/14/15   Elwyn Reach, MD  HYDROmorphone (DILAUDID) 4 MG tablet Take 1 tablet (4 mg total) by mouth every 4 (four) hours. Patient not  taking: Reported on 10/17/2015 10/14/15   Leana Gamer, MD  potassium chloride SA (K-DUR,KLOR-CON) 20 MEQ tablet Take 1 tablet (20 mEq total) by mouth 2 (two) times daily. Patient not taking: Reported on 10/07/2015 09/23/15   Rolland Porter, MD   BP 127/82 mmHg  Pulse 101  Temp(Src) 97.8 F (36.6 C) (Oral)  Resp 14  SpO2 97%  LMP 11/02/2015 Physical Exam  Constitutional: She is oriented to person, place, and time. She appears well-developed and well-nourished.  HENT:  Head: Normocephalic.  Eyes: Conjunctivae are normal.  Neck: Neck supple.   Cardiovascular: Regular rhythm.   Pulmonary/Chest: Effort normal and breath sounds normal.  Abdominal: Soft. Bowel sounds are normal.  Musculoskeletal: She exhibits tenderness. She exhibits no edema.  Neurological: She is alert and oriented to person, place, and time.  Skin: Skin is warm and dry.  Psychiatric: She has a normal mood and affect.  Nursing note and vitals reviewed.   ED Course  Procedures (including critical care time) Labs Review Labs Reviewed  COMPREHENSIVE METABOLIC PANEL - Abnormal; Notable for the following:    ALT 12 (*)    All other components within normal limits  CBC WITH DIFFERENTIAL/PLATELET - Abnormal; Notable for the following:    RBC 3.18 (*)    Hemoglobin 9.4 (*)    HCT 27.4 (*)    RDW 15.8 (*)    Platelets 599 (*)    All other components within normal limits    Imaging Review Dg Chest 2 View  11/10/2015  CLINICAL DATA:  Pain.  No reported injury. EXAM: CHEST  2 VIEW COMPARISON:  10/17/2015. FINDINGS: Mediastinum hilar structures normal. Bibasilar pleural parenchymal scarring and/or atelectasis again noted. No pleural effusion or pneumothorax. Heart size normal. No acute bony abnormality. IMPRESSION: Mild bibasilar pleural-parenchymal scarring and/or subsegmental atelectasis. No acute abnormality. No acute bony abnormality. Electronically Signed   By: Marcello Moores  Register   On: 11/10/2015 14:00   I have personally reviewed and evaluated these images and lab results as part of my medical decision-making.   EKG Interpretation None      <ECG> EKG reviewed.  NSR. No ectopy or ischemic changes.  Patient has received three escalating doses of dilaudid as well as toradol without significant reduction in pain.  Will request admission for pain management.  MDM   Final diagnoses:  None    Sickle cell pain crisis.    Etta Quill, NP 11/11/15 [id number]  Julianne Rice, MD 11/11/15 [phone]

## 2015-11-11 DIAGNOSIS — R112 Nausea with vomiting, unspecified: Secondary | ICD-10-CM | POA: Diagnosis not present

## 2015-11-11 DIAGNOSIS — D72829 Elevated white blood cell count, unspecified: Secondary | ICD-10-CM | POA: Diagnosis present

## 2015-11-11 DIAGNOSIS — F411 Generalized anxiety disorder: Secondary | ICD-10-CM | POA: Diagnosis not present

## 2015-11-11 DIAGNOSIS — G4700 Insomnia, unspecified: Secondary | ICD-10-CM | POA: Diagnosis present

## 2015-11-11 DIAGNOSIS — Z8249 Family history of ischemic heart disease and other diseases of the circulatory system: Secondary | ICD-10-CM | POA: Diagnosis not present

## 2015-11-11 DIAGNOSIS — F419 Anxiety disorder, unspecified: Secondary | ICD-10-CM | POA: Diagnosis present

## 2015-11-11 DIAGNOSIS — K5900 Constipation, unspecified: Secondary | ICD-10-CM | POA: Diagnosis present

## 2015-11-11 DIAGNOSIS — K5909 Other constipation: Secondary | ICD-10-CM | POA: Diagnosis not present

## 2015-11-11 DIAGNOSIS — R000 Tachycardia, unspecified: Secondary | ICD-10-CM | POA: Diagnosis present

## 2015-11-11 DIAGNOSIS — D638 Anemia in other chronic diseases classified elsewhere: Secondary | ICD-10-CM | POA: Diagnosis not present

## 2015-11-11 DIAGNOSIS — D5700 Hb-SS disease with crisis, unspecified: Secondary | ICD-10-CM | POA: Diagnosis present

## 2015-11-11 LAB — RETICULOCYTES
RBC.: 2.91 MIL/uL — ABNORMAL LOW (ref 3.87–5.11)
RETIC COUNT ABSOLUTE: 183.3 10*3/uL (ref 19.0–186.0)
RETIC CT PCT: 6.3 % — AB (ref 0.4–3.1)

## 2015-11-11 LAB — CBC WITH DIFFERENTIAL/PLATELET
BASOS PCT: 0 %
Basophils Absolute: 0 10*3/uL (ref 0.0–0.1)
EOS ABS: 0.1 10*3/uL (ref 0.0–0.7)
Eosinophils Relative: 1 %
HEMATOCRIT: 24.9 % — AB (ref 36.0–46.0)
HEMOGLOBIN: 8.5 g/dL — AB (ref 12.0–15.0)
LYMPHS ABS: 4.7 10*3/uL — AB (ref 0.7–4.0)
Lymphocytes Relative: 45 %
MCH: 29.2 pg (ref 26.0–34.0)
MCHC: 34.1 g/dL (ref 30.0–36.0)
MCV: 85.6 fL (ref 78.0–100.0)
MONOS PCT: 12 %
Monocytes Absolute: 1.2 10*3/uL — ABNORMAL HIGH (ref 0.1–1.0)
NEUTROS ABS: 4.4 10*3/uL (ref 1.7–7.7)
NEUTROS PCT: 42 %
Platelets: 545 10*3/uL — ABNORMAL HIGH (ref 150–400)
RBC: 2.91 MIL/uL — AB (ref 3.87–5.11)
RDW: 16.1 % — ABNORMAL HIGH (ref 11.5–15.5)
WBC: 10.4 10*3/uL (ref 4.0–10.5)

## 2015-11-11 LAB — CBC
HCT: 24.9 % — ABNORMAL LOW (ref 36.0–46.0)
Hemoglobin: 8.3 g/dL — ABNORMAL LOW (ref 12.0–15.0)
MCH: 28.5 pg (ref 26.0–34.0)
MCHC: 33.3 g/dL (ref 30.0–36.0)
MCV: 85.6 fL (ref 78.0–100.0)
PLATELETS: 496 10*3/uL — AB (ref 150–400)
RBC: 2.91 MIL/uL — AB (ref 3.87–5.11)
RDW: 16.6 % — AB (ref 11.5–15.5)
WBC: 10.9 10*3/uL — AB (ref 4.0–10.5)

## 2015-11-11 LAB — COMPREHENSIVE METABOLIC PANEL
ALT: 13 U/L — ABNORMAL LOW (ref 14–54)
ANION GAP: 7 (ref 5–15)
AST: 18 U/L (ref 15–41)
Albumin: 3.9 g/dL (ref 3.5–5.0)
Alkaline Phosphatase: 55 U/L (ref 38–126)
BUN: 5 mg/dL — ABNORMAL LOW (ref 6–20)
CHLORIDE: 105 mmol/L (ref 101–111)
CO2: 27 mmol/L (ref 22–32)
Calcium: 9.2 mg/dL (ref 8.9–10.3)
Creatinine, Ser: 0.49 mg/dL (ref 0.44–1.00)
Glucose, Bld: 104 mg/dL — ABNORMAL HIGH (ref 65–99)
POTASSIUM: 3.6 mmol/L (ref 3.5–5.1)
SODIUM: 139 mmol/L (ref 135–145)
Total Bilirubin: 2.3 mg/dL — ABNORMAL HIGH (ref 0.3–1.2)
Total Protein: 7.1 g/dL (ref 6.5–8.1)

## 2015-11-11 LAB — CREATININE, SERUM: Creatinine, Ser: 0.55 mg/dL (ref 0.44–1.00)

## 2015-11-11 LAB — TROPONIN I

## 2015-11-11 LAB — MAGNESIUM: MAGNESIUM: 1.9 mg/dL (ref 1.7–2.4)

## 2015-11-11 LAB — PHOSPHORUS: Phosphorus: 4.5 mg/dL (ref 2.5–4.6)

## 2015-11-11 MED ORDER — HYDROXYZINE HCL 25 MG PO TABS
25.0000 mg | ORAL_TABLET | ORAL | Status: DC | PRN
  Administered 2015-11-12: 25 mg via ORAL
  Administered 2015-11-12 – 2015-11-14 (×3): 50 mg via ORAL
  Filled 2015-11-11: qty 1
  Filled 2015-11-11 (×3): qty 2

## 2015-11-11 MED ORDER — POTASSIUM CHLORIDE CRYS ER 20 MEQ PO TBCR
20.0000 meq | EXTENDED_RELEASE_TABLET | Freq: Two times a day (BID) | ORAL | Status: DC
  Administered 2015-11-11 – 2015-11-17 (×13): 20 meq via ORAL
  Filled 2015-11-11 (×13): qty 1

## 2015-11-11 MED ORDER — CYCLOBENZAPRINE HCL 10 MG PO TABS
10.0000 mg | ORAL_TABLET | Freq: Three times a day (TID) | ORAL | Status: DC | PRN
  Administered 2015-11-11 – 2015-11-13 (×2): 10 mg via ORAL
  Filled 2015-11-11 (×3): qty 1

## 2015-11-11 MED ORDER — ENOXAPARIN SODIUM 40 MG/0.4ML ~~LOC~~ SOLN
40.0000 mg | SUBCUTANEOUS | Status: DC
  Administered 2015-11-12 – 2015-11-16 (×4): 40 mg via SUBCUTANEOUS
  Filled 2015-11-11 (×6): qty 0.4

## 2015-11-11 MED ORDER — NALOXONE HCL 0.4 MG/ML IJ SOLN: 0.4000 mg | INTRAMUSCULAR | Status: DC | PRN

## 2015-11-11 MED ORDER — HYDROMORPHONE HCL 2 MG/ML IJ SOLN
2.0000 mg | INTRAMUSCULAR | Status: AC | PRN
  Administered 2015-11-11 (×3): 2 mg via INTRAVENOUS
  Filled 2015-11-11 (×4): qty 1

## 2015-11-11 MED ORDER — DIPHENHYDRAMINE HCL 12.5 MG/5ML PO ELIX
12.5000 mg | ORAL_SOLUTION | Freq: Four times a day (QID) | ORAL | Status: DC | PRN
  Administered 2015-11-11 – 2015-11-12 (×4): 12.5 mg via ORAL
  Filled 2015-11-11 (×6): qty 5

## 2015-11-11 MED ORDER — ONDANSETRON HCL 4 MG/2ML IJ SOLN
4.0000 mg | Freq: Four times a day (QID) | INTRAMUSCULAR | Status: DC | PRN
  Administered 2015-11-11 – 2015-11-16 (×9): 4 mg via INTRAVENOUS
  Filled 2015-11-11 (×9): qty 2

## 2015-11-11 MED ORDER — SODIUM CHLORIDE 0.9 % IJ SOLN: 9.0000 mL | INTRAMUSCULAR | Status: DC | PRN

## 2015-11-11 MED ORDER — NAPROXEN 375 MG PO TABS
375.0000 mg | ORAL_TABLET | Freq: Two times a day (BID) | ORAL | Status: DC
  Administered 2015-11-11: 375 mg via ORAL
  Filled 2015-11-11 (×3): qty 1

## 2015-11-11 MED ORDER — SENNOSIDES-DOCUSATE SODIUM 8.6-50 MG PO TABS
1.0000 | ORAL_TABLET | Freq: Two times a day (BID) | ORAL | Status: DC
  Administered 2015-11-11 – 2015-11-17 (×13): 1 via ORAL
  Filled 2015-11-11 (×13): qty 1

## 2015-11-11 MED ORDER — POLYETHYLENE GLYCOL 3350 17 G PO PACK
17.0000 g | PACK | Freq: Every day | ORAL | Status: DC | PRN
  Administered 2015-11-14: 17 g via ORAL
  Filled 2015-11-11 (×2): qty 1

## 2015-11-11 MED ORDER — SODIUM CHLORIDE 0.9 % IV SOLN
12.5000 mg | Freq: Four times a day (QID) | INTRAVENOUS | Status: DC | PRN
  Administered 2015-11-14 (×2): 12.5 mg via INTRAVENOUS
  Filled 2015-11-11 (×5): qty 0.25

## 2015-11-11 MED ORDER — FOLIC ACID 1 MG PO TABS
1.0000 mg | ORAL_TABLET | Freq: Every day | ORAL | Status: DC
  Administered 2015-11-11 – 2015-11-17 (×7): 1 mg via ORAL
  Filled 2015-11-11 (×7): qty 1

## 2015-11-11 MED ORDER — DEXTROSE-NACL 5-0.45 % IV SOLN
INTRAVENOUS | Status: AC
  Administered 2015-11-11 (×2): via INTRAVENOUS

## 2015-11-11 MED ORDER — HYDROMORPHONE 1 MG/ML IV SOLN
INTRAVENOUS | Status: DC
  Administered 2015-11-11: 1.5 mg via INTRAVENOUS
  Administered 2015-11-11: 0.5 mg via INTRAVENOUS
  Administered 2015-11-11: 5 mg via INTRAVENOUS
  Administered 2015-11-11: 8.5 mg via INTRAVENOUS
  Administered 2015-11-12: 22.7 mL via INTRAVENOUS
  Administered 2015-11-12: 5.57 mL via INTRAVENOUS
  Administered 2015-11-12: 5.5 mg via INTRAVENOUS
  Administered 2015-11-12: 13:00:00 via INTRAVENOUS
  Administered 2015-11-12: 10.89 mg via INTRAVENOUS
  Administered 2015-11-12: 2 mg via INTRAVENOUS
  Administered 2015-11-13: 8 mg via INTRAVENOUS
  Administered 2015-11-13: 6.5 mg via INTRAVENOUS
  Administered 2015-11-13: 4.5 mg via INTRAVENOUS
  Administered 2015-11-13: 08:00:00 via INTRAVENOUS
  Administered 2015-11-13: 6 mg via INTRAVENOUS
  Administered 2015-11-13: 9 mg via INTRAVENOUS
  Administered 2015-11-14: 6.5 mg via INTRAVENOUS
  Administered 2015-11-14 (×2): 5 mg via INTRAVENOUS
  Administered 2015-11-14: 19:00:00 via INTRAVENOUS
  Administered 2015-11-14: 4.5 mg via INTRAVENOUS
  Administered 2015-11-14: 01:00:00 via INTRAVENOUS
  Administered 2015-11-14: 7.5 mg via INTRAVENOUS
  Administered 2015-11-14: 3.5 mg via INTRAVENOUS
  Administered 2015-11-15: 5 mg via INTRAVENOUS
  Administered 2015-11-15: 6.5 mg via INTRAVENOUS
  Administered 2015-11-15: 11.5 mg via INTRAVENOUS
  Administered 2015-11-15: 10:00:00 via INTRAVENOUS
  Administered 2015-11-15: 8.5 mg via INTRAVENOUS
  Administered 2015-11-15: 7.48 mg via INTRAVENOUS
  Administered 2015-11-15: 4.5 mg via INTRAVENOUS
  Administered 2015-11-15 – 2015-11-16 (×2): via INTRAVENOUS
  Administered 2015-11-16: 2.5 mg via INTRAVENOUS
  Administered 2015-11-16: 6.5 mg via INTRAVENOUS
  Administered 2015-11-16: 8.5 mg via INTRAVENOUS
  Administered 2015-11-16: 10.5 mg via INTRAVENOUS
  Administered 2015-11-16: 3.5 mg via INTRAVENOUS
  Administered 2015-11-16: 1 mg via INTRAVENOUS
  Administered 2015-11-17: 4.5 mg via INTRAVENOUS
  Administered 2015-11-17: 5 mg via INTRAVENOUS
  Administered 2015-11-17: 6.5 mg via INTRAVENOUS
  Administered 2015-11-17: 14:00:00 via INTRAVENOUS
  Administered 2015-11-17: 3.5 mg via INTRAVENOUS
  Filled 2015-11-11 (×10): qty 25

## 2015-11-11 MED ORDER — HYDROMORPHONE 1 MG/ML IV SOLN: INTRAVENOUS | Status: DC

## 2015-11-11 MED ORDER — KETOROLAC TROMETHAMINE 30 MG/ML IJ SOLN
30.0000 mg | Freq: Four times a day (QID) | INTRAMUSCULAR | Status: AC
  Administered 2015-11-11 – 2015-11-16 (×20): 30 mg via INTRAVENOUS
  Filled 2015-11-11 (×20): qty 1

## 2015-11-11 NOTE — Progress Notes (Signed)
Patient ID: Carmen [name], female   DOB: 1991/07/23, 23 y.o.   MRN: [id number]   Pt admitted this morning for Sickle Cell Crisis. Chart reviewed and orders adjusted after seeing and performing a focused examination of patient. Patient stable. Will re-assess pain in the morning.

## 2015-11-12 DIAGNOSIS — R112 Nausea with vomiting, unspecified: Secondary | ICD-10-CM

## 2015-11-12 DIAGNOSIS — D638 Anemia in other chronic diseases classified elsewhere: Secondary | ICD-10-CM

## 2015-11-12 DIAGNOSIS — D5700 Hb-SS disease with crisis, unspecified: Principal | ICD-10-CM

## 2015-11-12 LAB — BASIC METABOLIC PANEL
ANION GAP: 8 (ref 5–15)
CHLORIDE: 102 mmol/L (ref 101–111)
CO2: 27 mmol/L (ref 22–32)
Calcium: 9.6 mg/dL (ref 8.9–10.3)
Creatinine, Ser: 0.49 mg/dL (ref 0.44–1.00)
GFR calc Af Amer: >60 mL/min (ref >60)
GFR calc non Af Amer: >60 mL/min (ref >60)
GLUCOSE: 93 mg/dL (ref 65–99)
Potassium: 3.9 mmol/L (ref 3.5–5.1)
Sodium: 137 mmol/L (ref 135–145)

## 2015-11-13 MED ORDER — HYDROMORPHONE HCL 2 MG/ML IJ SOLN
2.0000 mg | INTRAMUSCULAR | Status: DC | PRN
  Administered 2015-11-13 – 2015-11-16 (×17): 2 mg via INTRAVENOUS
  Filled 2015-11-13 (×17): qty 1

## 2015-11-13 MED ORDER — DEXTROSE-NACL 5-0.45 % IV SOLN
INTRAVENOUS | Status: DC
  Administered 2015-11-13: 16:00:00 via INTRAVENOUS
  Administered 2015-11-14: 1000 mL via INTRAVENOUS
  Administered 2015-11-14 – 2015-11-15 (×3): via INTRAVENOUS

## 2015-11-13 NOTE — Progress Notes (Signed)
SICKLE CELL SERVICE PROGRESS NOTE  RIVERLY HOGSED [id number] DOB: Sep 06, 1991 DOA: 11/10/2015 PCP: [name], MD  Assessment/Plan: Active Problems:   Sickle cell crisis ([place])   Sickle cell disease with crisis ([place])   Chest pain  1. Hb SS with crisis: Pt reports pain as 8/10 and localized to chest wall, low back and legs. I will continue PCA at current dose and schedule clinician assisted doses. Continue Toradol and IVF at current dosing as patient has reported decrease oral intake and episodes of emesis which have not been witnessed.  2. Anemia of chronic disease: Hb at baseline. 3. Emesis: Pt has reported episodes of emesis which have not been witnessed. Electrolytes were normal yesterday and she has no abdominal findings. Pt also tolerating diet at this time.  Code Status: Full Code Family Communication: N/A Disposition Plan: Not yet ready for discharge  [place].  Pager [phone]. If 7PM-7AM, please contact night-coverage.  11/13/2015, 4:11 PM  LOS: 2 days    Consultants:  None  Procedures:  None  Antibiotics:  None   Objective: Filed Vitals:   11/13/15 0943 11/13/15 1236 11/13/15 1406 11/13/15 1555  BP: 106/84  111/63   Pulse: 80  84   Temp: 99.1 F (37.3 C)  97.9 F (36.6 C)   TempSrc: Oral  Oral   Resp: 19 11 10 10   Height:      Weight:      SpO2: 97% 91% 99% 95%   Weight change:   Intake/Output Summary (Last 24 hours) at 11/13/15 1611 Last data filed at 11/13/15 0944  Gross per 24 hour  Intake    360 ml  Output      0 ml  Net    360 ml    General: Alert, awake, oriented x3, in no acute distress.  HEENT: [place]/AT PEERL, EOMI, anicteric Neck: Trachea midline,  no masses, no thyromegal,y no JVD, no carotid bruit OROPHARYNX:  Moist, No exudate/ erythema/lesions.  Heart: Regular rate and rhythm, without murmurs, rubs, gallops, PMI non-displaced, no heaves or thrills on palpation.  Lungs: Clear to auscultation, no wheezing or rhonchi  noted. No increased vocal fremitus resonant to percussion  Abdomen: Soft, nontender, nondistended, positive bowel sounds, no masses no hepatosplenomegaly noted..  Neuro: No focal neurological deficits noted cranial nerves II through XII grossly intact. Strength at functional baseline in bilateral upper and lower extremities. Musculoskeletal: No warm swelling or erythema around joints, no spinal tenderness noted. Psychiatric: Patient alert and oriented x3, good insight and cognition, good recent to remote recall. Lymph node survey: No cervical axillary or inguinal lymphadenopathy noted.   Data Reviewed: Basic Metabolic Panel:  Recent Labs Lab 11/10/15 1400 11/11/15 0140 11/11/15 0446 11/12/15 0420  NA 143  --  139 137  K 3.6  --  3.6 3.9  CL 109  --  105 102  CO2 25  --  27 27  GLUCOSE 86  --  104* 93  BUN 8  --  5* <5*  CREATININE 0.65 0.55 0.49 0.49  CALCIUM 9.6  --  9.2 9.6  MG  --   --  1.9  --   PHOS  --   --  4.5  --    Liver Function Tests:  Recent Labs Lab 11/10/15 1400 11/11/15 0446  AST 16 18  ALT 12* 13*  ALKPHOS 54 55  BILITOT 1.0 2.3*  PROT 7.7 7.1  ALBUMIN 4.4 3.9   No results for input(s): LIPASE, AMYLASE in the last 168  hours. No results for input(s): AMMONIA in the last 168 hours. CBC:  Recent Labs Lab 11/10/15 1400 11/11/15 0140 11/11/15 0446  WBC 8.3 10.9* 10.4  NEUTROABS 3.7  --  4.4  HGB 9.4* 8.3* 8.5*  HCT 27.4* 24.9* 24.9*  MCV 86.2 85.6 85.6  PLT 599* 496* 545*   Cardiac Enzymes:  Recent Labs Lab 11/11/15 0140  TROPONINI <0.03   BNP (last 3 results) No results for input(s): BNP in the last 8760 hours.  ProBNP (last 3 results) No results for input(s): PROBNP in the last 8760 hours.  CBG: No results for input(s): GLUCAP in the last 168 hours.  No results found for this or any previous visit (from the past 240 hour(s)).   Studies: Dg Chest 2 View  11/10/2015  CLINICAL DATA:  Pain.  No reported injury. EXAM: CHEST  2 VIEW  COMPARISON:  10/17/2015. FINDINGS: Mediastinum hilar structures normal. Bibasilar pleural parenchymal scarring and/or atelectasis again noted. No pleural effusion or pneumothorax. Heart size normal. No acute bony abnormality. IMPRESSION: Mild bibasilar pleural-parenchymal scarring and/or subsegmental atelectasis. No acute abnormality. No acute bony abnormality. Electronically Signed   By: Marcello Moores  Register   On: 11/10/2015 14:00   Dg Chest 2 View  10/17/2015  CLINICAL DATA:  Sickle cell patient. Recent diagnosis of pneumonia. Shortness of breath. Diarrhea. Productive cough. Chest pain for 1 week. EXAM: CHEST  2 VIEW COMPARISON:  10/07/2015 FINDINGS: The right-sided PICC line is been removed. Minimal linear scarring along the right hemidiaphragm the lungs appear otherwise clear. Cardiac and mediastinal margins appear normal. No pleural effusion identified. IMPRESSION: 1. The lungs currently appear clear aside from some mild chronic scarring along the right hemidiaphragm. Electronically Signed   By: Van Clines M.D.   On: 10/17/2015 10:58   Ct Angio Chest Pe W/cm &/or Wo Cm  11/10/2015  CLINICAL DATA:  Substernal chest pain. Shortness of breath. History of sickle cell disease and sickle cell crisis. EXAM: CT ANGIOGRAPHY CHEST WITH CONTRAST TECHNIQUE: Multidetector CT imaging of the chest was performed using the standard protocol during bolus administration of intravenous contrast. Multiplanar CT image reconstructions and MIPs were obtained to evaluate the vascular anatomy. CONTRAST:  153mL OMNIPAQUE IOHEXOL 350 MG/ML SOLN COMPARISON:  Chest radiographs earlier this day. Most recent chest CT 02/25/2015 FINDINGS: Suboptimal contrast bolus, however no filling defects within the main, lobar, or proximal segmental pulmonary arteries. Subsegmental branches cannot be assessed. Heart is at the upper limits of normal in size. Thoracic aorta is normal in caliber. Soft tissue density anterior mediastinum consistent  with residual thymus, not unexpected for age. No mediastinal or hilar adenopathy. No pleural or pericardial effusion. Resolution of previous opacity in the superior segment of the left lower lobe. Right lower lobe atelectasis or scarring, similar to prior. No new consolidation. Scattered central bronchial thickening. Evaluation of the upper abdomen demonstrates absent spleen consistent with splenectomy. The liver is enlarged. Endplate depression and thoracic spine consistent with changes of sickle cell disease, unchanged. 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 Review of the MIP images confirms the above findings. IMPRESSION: 1. No pulmonary embolus. 2. Scattered bronchial thickening. Right lower lobe atelectasis or scarring. Electronically Signed   By: Jeb Levering M.D.   On: 11/10/2015 23:05    Scheduled Meds: . enoxaparin (LOVENOX) injection  40 mg Subcutaneous Q24H  . folic acid  1 mg Oral Daily  . HYDROmorphone   Intravenous 6 times per day  . ketorolac  30 mg Intravenous 4 times per  day  . potassium chloride SA  20 mEq Oral BID  . senna-docusate  1 tablet Oral BID   Continuous Infusions: . dextrose 5 % and 0.45% NaCl 100 mL/hr at 11/13/15 1554    Active Problems:   Sickle cell crisis ([facility])   Sickle cell disease with crisis ([place])   Chest pain

## 2015-11-14 ENCOUNTER — Inpatient Hospital Stay (HOSPITAL_COMMUNITY): Payer: Medicaid Other

## 2015-11-14 DIAGNOSIS — G4700 Insomnia, unspecified: Secondary | ICD-10-CM

## 2015-11-14 LAB — CBC WITH DIFFERENTIAL/PLATELET
BASOS ABS: 0 10*3/uL (ref 0.0–0.1)
Basophils Relative: 0 %
EOS ABS: 0.3 10*3/uL (ref 0.0–0.7)
EOS PCT: 2 %
HCT: 22.6 % — ABNORMAL LOW (ref 36.0–46.0)
HEMOGLOBIN: 8.1 g/dL — AB (ref 12.0–15.0)
Lymphocytes Relative: 35 %
Lymphs Abs: 4.9 10*3/uL — ABNORMAL HIGH (ref 0.7–4.0)
MCH: 29.3 pg (ref 26.0–34.0)
MCHC: 35.8 g/dL (ref 30.0–36.0)
MCV: 81.9 fL (ref 78.0–100.0)
MONOS PCT: 11 %
Monocytes Absolute: 1.5 10*3/uL — ABNORMAL HIGH (ref 0.1–1.0)
Neutro Abs: 7.3 10*3/uL (ref 1.7–7.7)
Neutrophils Relative %: 52 %
Platelets: 489 10*3/uL — ABNORMAL HIGH (ref 150–400)
RBC: 2.76 MIL/uL — AB (ref 3.87–5.11)
RDW: 18.8 % — ABNORMAL HIGH (ref 11.5–15.5)
WBC: 14 10*3/uL — AB (ref 4.0–10.5)
nRBC: 2 /100 WBC — ABNORMAL HIGH

## 2015-11-14 MED ORDER — ZOLPIDEM TARTRATE 5 MG PO TABS
5.0000 mg | ORAL_TABLET | Freq: Every evening | ORAL | Status: DC | PRN
  Administered 2015-11-15 – 2015-11-17 (×2): 5 mg via ORAL
  Filled 2015-11-14 (×3): qty 1
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
## 2015-11-16 DIAGNOSIS — K5909 Other constipation: Secondary | ICD-10-CM

## 2015-11-16 LAB — URINALYSIS, ROUTINE W REFLEX MICROSCOPIC
Bilirubin Urine: NEGATIVE
GLUCOSE, UA: NEGATIVE mg/dL
Hgb urine dipstick: NEGATIVE
Ketones, ur: NEGATIVE mg/dL
LEUKOCYTES UA: NEGATIVE
Nitrite: NEGATIVE
PH: 8 (ref 5.0–8.0)
Protein, ur: NEGATIVE mg/dL
SPECIFIC GRAVITY, URINE: 1.004 — AB (ref 1.005–1.030)

## 2015-11-16 LAB — BASIC METABOLIC PANEL
ANION GAP: 8 (ref 5–15)
BUN: <5 mg/dL — ABNORMAL LOW (ref 6–20)
CO2: 28 mmol/L (ref 22–32)
Calcium: 9.5 mg/dL (ref 8.9–10.3)
Chloride: 102 mmol/L (ref 101–111)
Creatinine, Ser: 0.33 mg/dL — ABNORMAL LOW (ref 0.44–1.00)
GFR calc Af Amer: >60 mL/min (ref >60)
GFR calc non Af Amer: >60 mL/min (ref >60)
GLUCOSE: 100 mg/dL — AB (ref 65–99)
POTASSIUM: 4.1 mmol/L (ref 3.5–5.1)
Sodium: 138 mmol/L (ref 135–145)

## 2015-11-16 MED ORDER — OXYCODONE-ACETAMINOPHEN 5-325 MG PO TABS
1.0000 | ORAL_TABLET | ORAL | Status: DC
  Administered 2015-11-16 – 2015-11-17 (×7): 1 via ORAL
  Filled 2015-11-16 (×7): qty 1

## 2015-11-16 MED ORDER — OXYCODONE HCL 5 MG PO TABS
5.0000 mg | ORAL_TABLET | ORAL | Status: DC
  Administered 2015-11-16 – 2015-11-17 (×7): 5 mg via ORAL
  Filled 2015-11-16 (×7): qty 1

## 2015-11-16 MED ORDER — MAGNESIUM CITRATE PO SOLN
1.0000 | Freq: Once | ORAL | Status: AC
  Administered 2015-11-16: 1 via ORAL
  Filled 2015-11-16: qty 296
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
## 2015-11-17 ENCOUNTER — Other Ambulatory Visit: Payer: Self-pay

## 2015-11-17 DIAGNOSIS — D72829 Elevated white blood cell count, unspecified: Secondary | ICD-10-CM

## 2015-11-17 DIAGNOSIS — F411 Generalized anxiety disorder: Secondary | ICD-10-CM

## 2015-11-17 DIAGNOSIS — R000 Tachycardia, unspecified: Secondary | ICD-10-CM

## 2015-11-17 LAB — CBC WITH DIFFERENTIAL/PLATELET
BASOS PCT: 0 %
Basophils Absolute: 0 10*3/uL (ref 0.0–0.1)
EOS PCT: 3 %
Eosinophils Absolute: 0.3 10*3/uL (ref 0.0–0.7)
HEMATOCRIT: 22.6 % — AB (ref 36.0–46.0)
Hemoglobin: 7.9 g/dL — ABNORMAL LOW (ref 12.0–15.0)
LYMPHS ABS: 4.5 10*3/uL — AB (ref 0.7–4.0)
LYMPHS PCT: 38 %
MCH: 29.5 pg (ref 26.0–34.0)
MCHC: 35 g/dL (ref 30.0–36.0)
MCV: 84.3 fL (ref 78.0–100.0)
MONO ABS: 1.8 10*3/uL — AB (ref 0.1–1.0)
MONOS PCT: 15 %
Neutro Abs: 5.4 10*3/uL (ref 1.7–7.7)
Neutrophils Relative %: 44 %
PLATELETS: 582 10*3/uL — AB (ref 150–400)
RBC: 2.68 MIL/uL — ABNORMAL LOW (ref 3.87–5.11)
RDW: 18.5 % — AB (ref 11.5–15.5)
WBC: 12.1 10*3/uL — AB (ref 4.0–10.5)

## 2015-11-17 MED ORDER — HYDROMORPHONE HCL 4 MG PO TABS: 4.0000 mg | ORAL_TABLET | ORAL | Status: DC

## 2015-11-17 MED ORDER — OXYCODONE-ACETAMINOPHEN 10-325 MG PO TABS: 1.0000 | ORAL_TABLET | ORAL | Status: DC | PRN

## 2015-11-17 MED ORDER — CYCLOBENZAPRINE HCL 10 MG PO TABS: 10.0000 mg | ORAL_TABLET | Freq: Three times a day (TID) | ORAL | Status: DC | PRN

## 2015-11-17 NOTE — Progress Notes (Signed)
Patient ambulated around unit, room air sats 98-100%. Will continue to monitor.

## 2015-11-17 NOTE — Progress Notes (Signed)
Patient discharged home, all discharge medications and instructions reviewed and questions answered. Patient to be assisted to vehicle by wheelchair.  
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
## 2015-11-22 ENCOUNTER — Emergency Department (HOSPITAL_COMMUNITY)
Admission: EM | Admit: 2015-11-22 | Discharge: 2015-11-22 | Disposition: A | Discharge status: Home / Self Care | Payer: Medicaid Other | Attending: Emergency Medicine | Admitting: Emergency Medicine

## 2015-11-22 ENCOUNTER — Encounter (HOSPITAL_COMMUNITY): Payer: Self-pay | Admitting: Emergency Medicine

## 2015-11-22 DIAGNOSIS — Z791 Long term (current) use of non-steroidal anti-inflammatories (NSAID): Secondary | ICD-10-CM | POA: Insufficient documentation

## 2015-11-22 DIAGNOSIS — D5700 Hb-SS disease with crisis, unspecified: Secondary | ICD-10-CM | POA: Diagnosis present

## 2015-11-22 DIAGNOSIS — Z3202 Encounter for pregnancy test, result negative: Secondary | ICD-10-CM | POA: Insufficient documentation

## 2015-11-22 LAB — CBC WITH DIFFERENTIAL/PLATELET
Basophils Absolute: 0 10*3/uL (ref 0.0–0.1)
Basophils Relative: 1 %
Eosinophils Absolute: 0.1 10*3/uL (ref 0.0–0.7)
Eosinophils Relative: 1 %
HEMATOCRIT: 24.1 % — AB (ref 36.0–46.0)
HEMOGLOBIN: 8.3 g/dL — AB (ref 12.0–15.0)
LYMPHS PCT: 32 %
Lymphs Abs: 2.6 10*3/uL (ref 0.7–4.0)
MCH: 28.8 pg (ref 26.0–34.0)
MCHC: 34.4 g/dL (ref 30.0–36.0)
MCV: 83.7 fL (ref 78.0–100.0)
MONOS PCT: 13 %
Monocytes Absolute: 1 10*3/uL (ref 0.1–1.0)
NEUTROS ABS: 4.4 10*3/uL (ref 1.7–7.7)
Neutrophils Relative %: 53 %
Platelets: 592 10*3/uL — ABNORMAL HIGH (ref 150–400)
RBC: 2.88 MIL/uL — ABNORMAL LOW (ref 3.87–5.11)
RDW: 16.1 % — ABNORMAL HIGH (ref 11.5–15.5)
WBC: 8.1 10*3/uL (ref 4.0–10.5)

## 2015-11-22 LAB — BASIC METABOLIC PANEL
ANION GAP: 8 (ref 5–15)
BUN: 8 mg/dL (ref 6–20)
CHLORIDE: 109 mmol/L (ref 101–111)
CO2: 21 mmol/L — AB (ref 22–32)
CREATININE: 0.48 mg/dL (ref 0.44–1.00)
Calcium: 9.3 mg/dL (ref 8.9–10.3)
GFR calc non Af Amer: >60 mL/min (ref >60)
Glucose, Bld: 88 mg/dL (ref 65–99)
Potassium: 3.8 mmol/L (ref 3.5–5.1)
Sodium: 138 mmol/L (ref 135–145)

## 2015-11-22 LAB — RETICULOCYTES
RBC.: 2.88 MIL/uL — ABNORMAL LOW (ref 3.87–5.11)
RETIC CT PCT: 6.9 % — AB (ref 0.4–3.1)
Retic Count, Absolute: 198.7 10*3/uL — ABNORMAL HIGH (ref 19.0–186.0)

## 2015-11-22 LAB — I-STAT BETA HCG BLOOD, ED (MC, WL, AP ONLY): I-stat hCG, quantitative: <5 m[IU]/mL (ref <5)

## 2015-11-22 MED ORDER — HYDROMORPHONE HCL 4 MG PO TABS: 4.0000 mg | ORAL_TABLET | ORAL | Status: DC

## 2015-11-22 MED ORDER — KETOROLAC TROMETHAMINE 30 MG/ML IJ SOLN
30.0000 mg | Freq: Once | INTRAMUSCULAR | Status: AC
  Administered 2015-11-22: 30 mg via INTRAVENOUS
  Filled 2015-11-22: qty 1

## 2015-11-22 MED ORDER — HYDROMORPHONE HCL 2 MG/ML IJ SOLN
2.0000 mg | Freq: Once | INTRAMUSCULAR | Status: AC
  Administered 2015-11-22: 2 mg via INTRAVENOUS
  Filled 2015-11-22: qty 1

## 2015-11-22 MED ORDER — ONDANSETRON HCL 4 MG/2ML IJ SOLN
4.0000 mg | Freq: Once | INTRAMUSCULAR | Status: AC
  Administered 2015-11-22: 4 mg via INTRAVENOUS
  Filled 2015-11-22: qty 2

## 2015-11-22 MED ORDER — DEXTROSE 5 % IV BOLUS
1000.0000 mL | Freq: Once | INTRAVENOUS | Status: AC
  Administered 2015-11-22: 1000 mL via INTRAVENOUS
  Filled 2015-11-22: qty 1000

## 2015-11-22 MED ORDER — DIPHENHYDRAMINE HCL 25 MG PO CAPS
50.0000 mg | ORAL_CAPSULE | Freq: Once | ORAL | Status: AC
  Administered 2015-11-22: 50 mg via ORAL
  Filled 2015-11-22: qty 2

## 2015-11-22 NOTE — ED Notes (Signed)
RN Manuela Schwartz attempting ultrasound IV

## 2015-11-22 NOTE — ED Provider Notes (Signed)
CSN: [id number]     Arrival date & time 11/22/15  1031 History   First MD Initiated Contact with Patient 11/22/15 1141     Chief Complaint  Patient presents with  . Sickle Cell Pain Crisis     (Consider location/radiation/quality/duration/timing/severity/associated sxs/prior Treatment) Patient is a 25 y.o. female presenting with sickle cell pain. The history is provided by the patient.  Sickle Cell Pain Crisis Location:  Upper extremity and lower extremity Severity:  Severe Onset quality:  Gradual Duration:  2 days Similar to previous crisis episodes: yes   Timing:  Constant Progression:  Worsening Chronicity:  New History of pulmonary emboli: no   Context: not dehydration and not infection   Relieved by:  Nothing Worsened by:  Nothing tried Ineffective treatments:  None tried Associated symptoms: no chest pain, no congestion, no cough, no fever, no headaches, no nausea, no shortness of breath, no vomiting and no wheezing   Risk factors: frequent admissions for pain    25 yo F with a chief complaint of sickle cell pain crisis. This feels like her previous crises. Pain in her arms and legs and back. Denies any new areas of pain. Denies fevers or chills. Denies chest pain. Patient is unsure what caused this sudden worsening of her pain. Patient states is been coming more often than normal recently. Denies dysuria denies abdominal pain.   Past Medical History  Diagnosis Date  . Sickle cell crisis ([place])   . Headache(784.0)   . Abortion     05/2012   Past Surgical History  Procedure Laterality Date  . Splenectomy    . Cholecystectomy N/A 11/30/2014    Procedure: LAPAROSCOPIC CHOLECYSTECTOMY SINGLE SITE WITH INTRAOPERATIVE CHOLANGIOGRAM;  Surgeon: Michael Boston, MD;  Location: [facility];  Service: General;  Laterality: N/A;   Family History  Problem Relation Age of Onset  . Hypertension Mother   . Sickle cell anemia Sister   . Kidney disease Sister     Lupus  . Arthritis Sister    . Sickle cell anemia Sister   . Sickle cell trait Sister   . Heart disease Maternal Aunt   . Heart disease Maternal Uncle    Social History  Substance Use Topics  . Smoking status: Never Smoker   . Smokeless tobacco: Never Used  . Alcohol Use: No   OB History    Gravida Para Term Preterm AB TAB SAB Ectopic Multiple Living   1    1          Review of Systems  Constitutional: Negative for fever and chills.  HENT: Negative for congestion and rhinorrhea.   Eyes: Negative for redness and visual disturbance.  Respiratory: Negative for cough, shortness of breath and wheezing.   Cardiovascular: Negative for chest pain and palpitations.  Gastrointestinal: Negative for nausea and vomiting.  Genitourinary: Negative for dysuria and urgency.  Musculoskeletal: Positive for myalgias and arthralgias.  Skin: Negative for pallor and wound.  Neurological: Negative for dizziness and headaches.      Allergies  Review of patient's allergies indicates no known allergies.  Home Medications   Prior to Admission medications   Medication Sig Start Date End Date Taking? Authorizing Provider  cyclobenzaprine (FLEXERIL) 10 MG tablet Take 1 tablet (10 mg total) by mouth 3 (three) times daily as needed for muscle spasms. 11/17/15  Yes Leana Gamer, MD  naproxen (NAPROSYN) 375 MG tablet Take 375 mg by mouth 2 (two) times daily with a meal. 10/30/15  Yes Historical Provider,  MD  oxyCODONE-acetaminophen (PERCOCET) 10-325 MG tablet Take 1 tablet by mouth every 4 (four) hours as needed for pain. Hold until Oral Dilaudid completed. Then resume as prescribed. 11/17/15  Yes Leana Gamer, MD  folic acid (FOLVITE) 1 MG tablet Take 1 tablet (1 mg total) by mouth daily. Patient not taking: Reported on 10/17/2015 09/14/15   Elwyn Reach, MD  HYDROmorphone (DILAUDID) 4 MG tablet Take 1 tablet (4 mg total) by mouth every 4 (four) hours. 11/22/15   Deno Etienne, DO  potassium chloride SA (K-DUR,KLOR-CON)  20 MEQ tablet Take 1 tablet (20 mEq total) by mouth 2 (two) times daily. Patient not taking: Reported on 10/07/2015 09/23/15   Rolland Porter, MD   BP 119/51 mmHg  Pulse 77  Temp(Src) 98.5 F (36.9 C) (Oral)  Resp 18  SpO2 96%  LMP 11/02/2015 Physical Exam  Constitutional: She is oriented to person, place, and time. She appears well-developed and well-nourished. No distress.  HENT:  Head: Normocephalic and atraumatic.  Eyes: EOM are normal. Pupils are equal, round, and reactive to light.  Neck: Normal range of motion. Neck supple.  Cardiovascular: Normal rate and regular rhythm.  Exam reveals no gallop and no friction rub.   No murmur heard. Pulmonary/Chest: Effort normal. She has no wheezes. She has no rales.  Abdominal: Soft. She exhibits no distension. There is no tenderness.  Musculoskeletal: She exhibits no edema or tenderness.  Neurological: She is alert and oriented to person, place, and time.  Skin: Skin is warm and dry. She is not diaphoretic.  Psychiatric: She has a normal mood and affect. Her behavior is normal.  Nursing note and vitals reviewed.   ED Course  Procedures (including critical care time) Labs Review Labs Reviewed  CBC WITH DIFFERENTIAL/PLATELET - Abnormal; Notable for the following:    RBC 2.88 (*)    Hemoglobin 8.3 (*)    HCT 24.1 (*)    RDW 16.1 (*)    Platelets 592 (*)    All other components within normal limits  BASIC METABOLIC PANEL - Abnormal; Notable for the following:    CO2 21 (*)    All other components within normal limits  RETICULOCYTES - Abnormal; Notable for the following:    Retic Ct Pct 6.9 (*)    RBC. 2.88 (*)    Retic Count, Manual 198.7 (*)    All other components within normal limits  I-STAT BETA HCG BLOOD, ED ([facility], [facility], [facility] ONLY)    Imaging Review No results found. I have personally reviewed and evaluated these images and lab results as part of my medical decision-making.   EKG Interpretation None      MDM   Final  diagnoses:  Hb-SS disease with crisis [facility])    25 yo F with a chief complaint of a sickle cell pain crisis. Will aggressively treat her pain give her a fluid bolus for possible dehydration.   Patient was given 3 IV doses of Dilaudid. Patient feels that she is couple going home. Of note patient should've with her sister. This is the third visit that they've shown up together. Her sister also has sickle cell disease and was actively seeking narcotics at her last admission. The patient was stating that she needed to be admitted until her sister is being discharged and then she said that she would be safe to go home.   Will give small script for narcotics.  PCP follow up.   3:37 PM:  I have discussed the diagnosis/risks/treatment  options with the patient and believe the pt to be eligible for discharge home to follow-up with Heme/onc. We also discussed returning to the ED immediately if new or worsening sx occur. We discussed the sx which are most concerning (e.g., sudden worsening pain, fever, inability to tolerate by mouth ) that necessitate immediate return. Medications administered to the patient during their visit and any new prescriptions provided to the patient are listed below.  Medications given during this visit Medications  HYDROmorphone (DILAUDID) injection 2 mg (2 mg Intravenous Given 11/22/15 1202)  ondansetron (ZOFRAN) injection 4 mg (4 mg Intravenous Given 11/22/15 1202)  diphenhydrAMINE (BENADRYL) capsule 50 mg (50 mg Oral Given 11/22/15 1202)  dextrose 5 % bolus 1,000 mL (0 mLs Intravenous Stopped 11/22/15 1519)  HYDROmorphone (DILAUDID) injection 2 mg (2 mg Intravenous Given 11/22/15 1253)  HYDROmorphone (DILAUDID) injection 2 mg (2 mg Intravenous Given 11/22/15 1445)  ketorolac (TORADOL) 30 MG/ML injection 30 mg (30 mg Intravenous Given 11/22/15 1445)    Discharge Medication List as of 11/22/2015  3:07 PM      The patient appears reasonably screen and/or stabilized for discharge and  I doubt any other medical condition or other [facility] requiring further screening, evaluation, or treatment in the ED at this time prior to discharge.    Deno Etienne, DO 11/22/15 1537

## 2015-11-22 NOTE — ED Notes (Signed)
Pt reports sickle cell pain in arms and legs for the past few days. Pains typical for crisis. Home medications not helping.

## 2015-11-22 NOTE — Discharge Instructions (Signed)

## 2015-12-06 ENCOUNTER — Encounter (HOSPITAL_COMMUNITY): Payer: Self-pay | Admitting: *Deleted

## 2015-12-06 ENCOUNTER — Emergency Department (HOSPITAL_COMMUNITY)
Admission: EM | Admit: 2015-12-06 | Discharge: 2015-12-06 | Disposition: A | Discharge status: Home / Self Care | Payer: Medicaid Other | Attending: Emergency Medicine | Admitting: Emergency Medicine

## 2015-12-06 ENCOUNTER — Emergency Department (HOSPITAL_COMMUNITY): Payer: Medicaid Other

## 2015-12-06 DIAGNOSIS — D5700 Hb-SS disease with crisis, unspecified: Secondary | ICD-10-CM

## 2015-12-06 DIAGNOSIS — Z791 Long term (current) use of non-steroidal anti-inflammatories (NSAID): Secondary | ICD-10-CM | POA: Diagnosis not present

## 2015-12-06 DIAGNOSIS — Z792 Long term (current) use of antibiotics: Secondary | ICD-10-CM | POA: Insufficient documentation

## 2015-12-06 LAB — CBC WITH DIFFERENTIAL/PLATELET
BASOS ABS: 0 10*3/uL (ref 0.0–0.1)
Basophils Relative: 0 %
Eosinophils Absolute: 0.1 10*3/uL (ref 0.0–0.7)
Eosinophils Relative: 1 %
HEMATOCRIT: 25.5 % — AB (ref 36.0–46.0)
Hemoglobin: 8.8 g/dL — ABNORMAL LOW (ref 12.0–15.0)
LYMPHS ABS: 3.6 10*3/uL (ref 0.7–4.0)
LYMPHS PCT: 46 %
MCH: 29.1 pg (ref 26.0–34.0)
MCHC: 34.5 g/dL (ref 30.0–36.0)
MCV: 84.4 fL (ref 78.0–100.0)
Monocytes Absolute: 0.8 10*3/uL (ref 0.1–1.0)
Monocytes Relative: 9 %
NEUTROS ABS: 3.6 10*3/uL (ref 1.7–7.7)
Neutrophils Relative %: 44 %
Platelets: 744 10*3/uL — ABNORMAL HIGH (ref 150–400)
RBC: 3.02 MIL/uL — AB (ref 3.87–5.11)
RDW: 15.6 % — ABNORMAL HIGH (ref 11.5–15.5)
WBC: 8.1 10*3/uL (ref 4.0–10.5)

## 2015-12-06 LAB — RETICULOCYTES
RBC.: 3.02 MIL/uL — ABNORMAL LOW (ref 3.87–5.11)
RETIC COUNT ABSOLUTE: 220.5 10*3/uL — AB (ref 19.0–186.0)
Retic Ct Pct: 7.3 % — ABNORMAL HIGH (ref 0.4–3.1)

## 2015-12-06 LAB — COMPREHENSIVE METABOLIC PANEL
ALK PHOS: 63 U/L (ref 38–126)
ALT: 15 U/L (ref 14–54)
AST: 18 U/L (ref 15–41)
Albumin: 4.2 g/dL (ref 3.5–5.0)
Anion gap: 7 (ref 5–15)
BUN: 7 mg/dL (ref 6–20)
CO2: 24 mmol/L (ref 22–32)
Calcium: 9.7 mg/dL (ref 8.9–10.3)
Chloride: 114 mmol/L — ABNORMAL HIGH (ref 101–111)
Creatinine, Ser: 0.57 mg/dL (ref 0.44–1.00)
GFR calc non Af Amer: >60 mL/min (ref >60)
Glucose, Bld: 95 mg/dL (ref 65–99)
Potassium: 3.6 mmol/L (ref 3.5–5.1)
SODIUM: 145 mmol/L (ref 135–145)
Total Bilirubin: 0.8 mg/dL (ref 0.3–1.2)
Total Protein: 8.1 g/dL (ref 6.5–8.1)

## 2015-12-06 LAB — I-STAT TROPONIN, ED: TROPONIN I, POC: 0 ng/mL (ref 0.00–0.08)

## 2015-12-06 MED ORDER — HYDROMORPHONE HCL 2 MG/ML IJ SOLN
2.0000 mg | Freq: Once | INTRAMUSCULAR | Status: DC
  Filled 2015-12-06: qty 1

## 2015-12-06 MED ORDER — HYDROMORPHONE HCL 2 MG/ML IJ SOLN
2.0000 mg | Freq: Once | INTRAMUSCULAR | Status: AC
  Administered 2015-12-06: 2 mg via INTRAVENOUS

## 2015-12-06 MED ORDER — HYDROMORPHONE HCL 2 MG/ML IJ SOLN
2.0000 mg | Freq: Once | INTRAMUSCULAR | Status: AC
  Administered 2015-12-06: 2 mg via INTRAVENOUS
  Filled 2015-12-06: qty 1

## 2015-12-06 NOTE — ED Notes (Signed)
Pt on cell phone in room.  Pt calm.  Pain assessed.  CXR is complete.

## 2015-12-06 NOTE — ED Provider Notes (Signed)
CSN: [id number]     Arrival date & time 12/06/15  0557 History   First MD Initiated Contact with Patient 12/06/15 [phone]     Chief Complaint  Patient presents with  . Sickle Cell Pain Crisis  . Chest Pain   HPI   25 year old female presents today with sickle cell pain crisis. Patient reports symptoms started approximately 3 days ago in her shoulders moving down to her upper extremities and legs. She describes it as sharp pains, worse with ambulation. She states this is not improved with her home medications of Percocet and ibuprofen, reporting last dose yesterday morning. She denies any persistent chest pain, abdominal pain, lower extremity swelling edema, rash, fever, or any other concerning signs or symptoms today. She denies any known aggravating or provoking stimuli.   Past Medical History  Diagnosis Date  . Sickle cell crisis ([place])   . Headache(784.0)   . Abortion     05/2012   Past Surgical History  Procedure Laterality Date  . Splenectomy    . Cholecystectomy N/A 11/30/2014    Procedure: LAPAROSCOPIC CHOLECYSTECTOMY SINGLE SITE WITH INTRAOPERATIVE CHOLANGIOGRAM;  Surgeon: Michael Boston, MD;  Location: [facility];  Service: General;  Laterality: N/A;   Family History  Problem Relation Age of Onset  . Hypertension Mother   . Sickle cell anemia Sister   . Kidney disease Sister     Lupus  . Arthritis Sister   . Sickle cell anemia Sister   . Sickle cell trait Sister   . Heart disease Maternal Aunt   . Heart disease Maternal Uncle    Social History  Substance Use Topics  . Smoking status: Never Smoker   . Smokeless tobacco: Never Used  . Alcohol Use: No   OB History    Gravida Para Term Preterm AB TAB SAB Ectopic Multiple Living   1    1          Review of Systems  All other systems reviewed and are negative.   Allergies  Review of patient's allergies indicates no known allergies.  Home Medications   Prior to Admission medications   Medication Sig Start Date End Date  Taking? Authorizing Provider  amoxicillin (AMOXIL) 500 MG capsule Take 500 mg by mouth 3 (three) times daily. 12/01/15  Yes Historical Provider, MD  cyclobenzaprine (FLEXERIL) 10 MG tablet Take 1 tablet (10 mg total) by mouth 3 (three) times daily as needed for muscle spasms. 11/17/15  Yes Leana Gamer, MD  ibuprofen (ADVIL,MOTRIN) 800 MG tablet Take 800 mg by mouth 3 (three) times daily as needed. pain 12/01/15  Yes Historical Provider, MD  naproxen (NAPROSYN) 375 MG tablet Take 375 mg by mouth 2 (two) times daily with a meal. 10/30/15  Yes Historical Provider, MD  oxyCODONE-acetaminophen (PERCOCET) 10-325 MG tablet Take 1 tablet by mouth every 4 (four) hours as needed for pain. Hold until Oral Dilaudid completed. Then resume as prescribed. 11/17/15  Yes Leana Gamer, MD  folic acid (FOLVITE) 1 MG tablet Take 1 tablet (1 mg total) by mouth daily. Patient not taking: Reported on 10/17/2015 09/14/15   Elwyn Reach, MD  HYDROmorphone (DILAUDID) 4 MG tablet Take 1 tablet (4 mg total) by mouth every 4 (four) hours. Patient not taking: Reported on 12/06/2015 11/22/15   Deno Etienne, DO  potassium chloride SA (K-DUR,KLOR-CON) 20 MEQ tablet Take 1 tablet (20 mEq total) by mouth 2 (two) times daily. Patient not taking: Reported on 10/07/2015 09/23/15   Rolland Porter, MD  BP 111/73 mmHg  Pulse 92  Temp(Src) 98.5 F (36.9 C) (Oral)  Resp 20  Wt 84.823 kg  SpO2 100%  LMP 11/27/2015   Physical Exam  Constitutional: She is oriented to person, place, and time. She appears well-developed and well-nourished.  HENT:  Head: Normocephalic and atraumatic.  Eyes: Conjunctivae and EOM are normal. Pupils are equal, round, and reactive to light. Right eye exhibits no discharge. Left eye exhibits no discharge. No scleral icterus.  Neck: Normal range of motion. No JVD present. No tracheal deviation present.  Full active pain free range of motion of the neck; supple  Cardiovascular: Normal rate, regular  rhythm, normal heart sounds and intact distal pulses.  Exam reveals no gallop and no friction rub.   No murmur heard. Pulmonary/Chest: Effort normal and breath sounds normal. No stridor. No respiratory distress. She has no wheezes. She has no rales. She exhibits no tenderness.  Nontender to palpation  Abdominal: Soft. She exhibits no distension and no mass. There is no tenderness. There is no rebound and no guarding.  Musculoskeletal: Normal range of motion.  No obvious swelling, warmth touch, signs of infection to the upper lower extremities chest back or abdomen. Muscular compartments are soft, joints are supple with no swelling, warmth to touch, or decreased range of motion  Lymphadenopathy:    She has no cervical adenopathy.  Neurological: She is alert and oriented to person, place, and time. Coordination normal.  Skin: Skin is warm and dry. No rash noted. She is not diaphoretic. No erythema. No pallor.  Psychiatric: She has a normal mood and affect. Her behavior is normal. Judgment and thought content normal.  Nursing note and vitals reviewed.   ED Course  Procedures (including critical care time) Labs Review Labs Reviewed  COMPREHENSIVE METABOLIC PANEL - Abnormal; Notable for the following:    Chloride 114 (*)    All other components within normal limits  CBC WITH DIFFERENTIAL/PLATELET - Abnormal; Notable for the following:    RBC 3.02 (*)    Hemoglobin 8.8 (*)    HCT 25.5 (*)    RDW 15.6 (*)    Platelets 744 (*)    All other components within normal limits  RETICULOCYTES - Abnormal; Notable for the following:    Retic Ct Pct 7.3 (*)    RBC. 3.02 (*)    Retic Count, Manual 220.5 (*)    All other components within normal limits  I-STAT TROPOININ, ED    Imaging Review Dg Chest 2 View  12/06/2015  CLINICAL DATA:  Sickle cell. Chest pain since yesterday. Shortness of breath and cough. EXAM: CHEST  2 VIEW COMPARISON:  11/14/2015 FINDINGS: The cardiomediastinal silhouette is  within normal limits. The lungs are well inflated with minimal chronic scarring in the right lung base and unchanged, slight peribronchial thickening. There is no evidence of confluent opacity, edema, pleural effusion, or pneumothorax. Right upper quadrant abdominal surgical clips are noted. No acute osseous abnormality is identified. IMPRESSION: No active cardiopulmonary disease. Electronically Signed   By: Logan Bores M.D.   On: 12/06/2015 07:40   I have personally reviewed and evaluated these images and lab results as part of my medical decision-making.   EKG Interpretation None      MDM   Final diagnoses:  Sickle cell pain crisis ([place])    Labs: I-STAT troponin, CMP, CBC, reticulocytes- no significant findings  Imaging: DG chest 2 view  Consults:  Therapeutics: Dilaudid 2 mg 3, normal saline  Discharge Meds:  Assessment/Plan:  Pt with a history of sickle cell disease presents with likely vasoocclusive pain episode. Today's presentation typical of previous episodes and managed with above hospital administered medications with good symptomatic improvement. Pt has no signs or symptoms that would indicate acutely life threatening or disabling conditions such as infection, severe anemia, stroke, acute chest syndrome, renal infarction, bone infection, dactylitis, MI, priapism, organ failure, or venous thromboembolism. Patient is instructed to follow-up with his primary provider, and is given strict return precautions the event new or worsening signs or symptoms present. Patient verbalizes understanding and agreement for today's plan has no further questions or concerns at the time of discharge.        Okey Regal, PA-C 12/06/15 [id number]  April Palumbo, MD 12/06/15 [phone]

## 2015-12-06 NOTE — ED Notes (Signed)
Pt states that she is having a sickle cell crisis; pt states that she is having bilateral arm and leg pain for the last 2-3 days; pt states that she began having intermittent sharp shooting pains to her chest yesterday; pt denies other sx like shortness of breath, N/V; pt states that she has been taking her medications at home with no relief

## 2015-12-06 NOTE — Discharge Instructions (Signed)
Sickle Cell Anemia, Adult Sickle cell anemia is a condition in which red blood cells have an abnormal "sickle" shape. This abnormal shape shortens the cells' life span, which results in a lower than normal concentration of red blood cells in the blood. The sickle shape also causes the cells to clump together and block free blood flow through the blood vessels. As a result, the tissues and organs of the body do not receive enough oxygen. Sickle cell anemia causes organ damage and pain and increases the risk of infection. CAUSES  Sickle cell anemia is a genetic disorder. Those who receive two copies of the gene have the condition, and those who receive one copy have the trait. RISK FACTORS The sickle cell gene is most common in people whose families originated in [place]. Other areas of the globe where sickle cell trait occurs include the Mediterranean, [place] and [place], the [place], and the [place].  SIGNS AND SYMPTOMS  Pain, especially in the extremities, back, chest, or abdomen (common). The pain may start suddenly or may develop following an illness, especially if there is dehydration. Pain can also occur due to overexertion or exposure to extreme temperature changes.  Frequent severe bacterial infections, especially certain types of pneumonia and meningitis.  Pain and swelling in the hands and feet.  Decreased activity.   Loss of appetite.   Change in behavior.  Headaches.  Seizures.  Shortness of breath or difficulty breathing.  Vision changes.  Skin ulcers. Those with the trait may not have symptoms or they may have mild symptoms.  DIAGNOSIS  Sickle cell anemia is diagnosed with blood tests that demonstrate the genetic trait. It is often diagnosed during the newborn period, due to mandatory testing nationwide. A variety of blood tests, X-rays, CT scans, MRI scans, ultrasounds, and lung function tests may also be done to monitor the condition. TREATMENT  Sickle  cell anemia may be treated with:  Medicines. You may be given pain medicines, antibiotic medicines (to treat and prevent infections) or medicines to increase the production of certain types of hemoglobin.  Fluids.  Oxygen.  Blood transfusions. HOME CARE INSTRUCTIONS   Drink enough fluid to keep your urine clear or pale yellow. Increase your fluid intake in hot weather and during exercise.  Do not smoke. Smoking lowers oxygen levels in the blood.   Only take over-the-counter or prescription medicines for pain, fever, or discomfort as directed by your health care provider.  Take antibiotics as directed by your health care provider. Make sure you finish them it even if you start to feel better.   Take supplements as directed by your health care provider.   Consider wearing a medical alert bracelet. This tells anyone caring for you in an emergency of your condition.   When traveling, keep your medical information, health care provider's names, and the medicines you take with you at all times.   If you develop a fever, do not take medicines to reduce the fever right away. This could cover up a problem that is developing. Notify your health care provider.  Keep all follow-up appointments with your health care provider. Sickle cell anemia requires regular medical care. SEEK MEDICAL CARE IF: You have a fever. SEEK IMMEDIATE MEDICAL CARE IF:   You feel dizzy or faint.   You have new abdominal pain, especially on the left side near the stomach area.   You develop a persistent, often uncomfortable and painful penile erection (priapism). If this is not treated immediately it   will lead to impotence.   You have numbness your arms or legs or you have a hard time moving them.   You have a hard time with speech.   You have a fever or persistent symptoms for more than 2-3 days.   You have a fever and your symptoms suddenly get worse.   You have signs or symptoms of infection.  These include:   Chills.   Abnormal tiredness (lethargy).   Irritability.   Poor eating.   Vomiting.   You develop pain that is not helped with medicine.   You develop shortness of breath.  You have pain in your chest.   You are coughing up pus-like or bloody sputum.   You develop a stiff neck.  Your feet or hands swell or have pain.  Your abdomen appears bloated.  You develop joint pain. MAKE SURE YOU:  Understand these instructions.   This information is not intended to replace advice given to you by your health care provider. Make sure you discuss any questions you have with your health care provider.   Document Released: 01/26/2006 Document Revised: 11/08/2014 Document Reviewed: 05/30/2013 Elsevier Interactive Patient Education 2016 [employer]  

## 2015-12-09 ENCOUNTER — Ambulatory Visit: Payer: Medicaid Other | Admitting: Internal Medicine

## 2015-12-19 ENCOUNTER — Inpatient Hospital Stay (HOSPITAL_COMMUNITY)
Admission: EM | Admit: 2015-12-19 | Discharge: 2015-12-25 | DRG: 812 | Disposition: A | Discharge status: Home / Self Care | Payer: Medicaid Other | Attending: Internal Medicine | Admitting: Internal Medicine

## 2015-12-19 ENCOUNTER — Encounter (HOSPITAL_COMMUNITY): Payer: Self-pay

## 2015-12-19 DIAGNOSIS — Z8249 Family history of ischemic heart disease and other diseases of the circulatory system: Secondary | ICD-10-CM | POA: Diagnosis not present

## 2015-12-19 DIAGNOSIS — D519 Vitamin B12 deficiency anemia, unspecified: Secondary | ICD-10-CM | POA: Diagnosis not present

## 2015-12-19 DIAGNOSIS — Y92231 Patient bathroom in hospital as the place of occurrence of the external cause: Secondary | ICD-10-CM | POA: Diagnosis not present

## 2015-12-19 DIAGNOSIS — D638 Anemia in other chronic diseases classified elsewhere: Secondary | ICD-10-CM | POA: Diagnosis not present

## 2015-12-19 DIAGNOSIS — Z9049 Acquired absence of other specified parts of digestive tract: Secondary | ICD-10-CM | POA: Diagnosis not present

## 2015-12-19 DIAGNOSIS — D5700 Hb-SS disease with crisis, unspecified: Principal | ICD-10-CM | POA: Diagnosis present

## 2015-12-19 DIAGNOSIS — K5900 Constipation, unspecified: Secondary | ICD-10-CM | POA: Diagnosis present

## 2015-12-19 DIAGNOSIS — E860 Dehydration: Secondary | ICD-10-CM | POA: Diagnosis present

## 2015-12-19 DIAGNOSIS — E876 Hypokalemia: Secondary | ICD-10-CM | POA: Diagnosis present

## 2015-12-19 DIAGNOSIS — D571 Sickle-cell disease without crisis: Secondary | ICD-10-CM | POA: Diagnosis present

## 2015-12-19 DIAGNOSIS — W19XXXA Unspecified fall, initial encounter: Secondary | ICD-10-CM | POA: Diagnosis not present

## 2015-12-19 DIAGNOSIS — Z9081 Acquired absence of spleen: Secondary | ICD-10-CM | POA: Diagnosis not present

## 2015-12-19 DIAGNOSIS — K5909 Other constipation: Secondary | ICD-10-CM | POA: Diagnosis not present

## 2015-12-19 DIAGNOSIS — W1830XA Fall on same level, unspecified, initial encounter: Secondary | ICD-10-CM | POA: Diagnosis not present

## 2015-12-19 DIAGNOSIS — O99019 Anemia complicating pregnancy, unspecified trimester: Secondary | ICD-10-CM

## 2015-12-19 DIAGNOSIS — Z832 Family history of diseases of the blood and blood-forming organs and certain disorders involving the immune mechanism: Secondary | ICD-10-CM

## 2015-12-19 DIAGNOSIS — Y92239 Unspecified place in hospital as the place of occurrence of the external cause: Secondary | ICD-10-CM | POA: Diagnosis not present

## 2015-12-19 LAB — CBC WITH DIFFERENTIAL/PLATELET
BASOS ABS: 0 10*3/uL (ref 0.0–0.1)
Basophils Absolute: 0 10*3/uL (ref 0.0–0.1)
Basophils Relative: 0 %
Basophils Relative: 0 %
EOS PCT: 0 %
Eosinophils Absolute: 0 10*3/uL (ref 0.0–0.7)
Eosinophils Absolute: 0 10*3/uL (ref 0.0–0.7)
Eosinophils Relative: 0 %
HEMATOCRIT: 26.3 % — AB (ref 36.0–46.0)
HEMATOCRIT: 23.8 % — AB (ref 36.0–46.0)
HEMOGLOBIN: 8.3 g/dL — AB (ref 12.0–15.0)
Hemoglobin: 9.3 g/dL — ABNORMAL LOW (ref 12.0–15.0)
LYMPHS ABS: 2.9 10*3/uL (ref 0.7–4.0)
LYMPHS ABS: 5.7 10*3/uL — AB (ref 0.7–4.0)
LYMPHS PCT: 25 %
Lymphocytes Relative: 42 %
MCH: 29.7 pg (ref 26.0–34.0)
MCH: 29.3 pg (ref 26.0–34.0)
MCHC: 35.4 g/dL (ref 30.0–36.0)
MCHC: 34.9 g/dL (ref 30.0–36.0)
MCV: 84 fL (ref 78.0–100.0)
MCV: 84.1 fL (ref 78.0–100.0)
MONO ABS: 1 10*3/uL (ref 0.1–1.0)
MONOS PCT: 9 %
MONOS PCT: 9 %
Monocytes Absolute: 1.3 10*3/uL — ABNORMAL HIGH (ref 0.1–1.0)
NEUTROS ABS: 6.5 10*3/uL (ref 1.7–7.7)
NEUTROS PCT: 49 %
Neutro Abs: 7.8 10*3/uL — ABNORMAL HIGH (ref 1.7–7.7)
Neutrophils Relative %: 66 %
PLATELETS: 537 10*3/uL — AB (ref 150–400)
Platelets: 579 10*3/uL — ABNORMAL HIGH (ref 150–400)
RBC: 3.13 MIL/uL — ABNORMAL LOW (ref 3.87–5.11)
RBC: 2.83 MIL/uL — ABNORMAL LOW (ref 3.87–5.11)
RDW: 16.9 % — AB (ref 11.5–15.5)
RDW: 16.4 % — ABNORMAL HIGH (ref 11.5–15.5)
WBC: 11.8 10*3/uL — AB (ref 4.0–10.5)
WBC: 13.5 10*3/uL — ABNORMAL HIGH (ref 4.0–10.5)

## 2015-12-19 LAB — COMPREHENSIVE METABOLIC PANEL
ALBUMIN: 4.2 g/dL (ref 3.5–5.0)
ALT: 17 U/L (ref 14–54)
ALT: 17 U/L (ref 14–54)
AST: 27 U/L (ref 15–41)
AST: 25 U/L (ref 15–41)
Albumin: 4.6 g/dL (ref 3.5–5.0)
Alkaline Phosphatase: 62 U/L (ref 38–126)
Alkaline Phosphatase: 57 U/L (ref 38–126)
Anion gap: 10 (ref 5–15)
Anion gap: 8 (ref 5–15)
BILIRUBIN TOTAL: 1.5 mg/dL — AB (ref 0.3–1.2)
BILIRUBIN TOTAL: 2 mg/dL — AB (ref 0.3–1.2)
CHLORIDE: 108 mmol/L (ref 101–111)
CO2: 23 mmol/L (ref 22–32)
CO2: 25 mmol/L (ref 22–32)
Calcium: 9.4 mg/dL (ref 8.9–10.3)
Calcium: 8.9 mg/dL (ref 8.9–10.3)
Chloride: 108 mmol/L (ref 101–111)
Creatinine, Ser: 0.34 mg/dL — ABNORMAL LOW (ref 0.44–1.00)
Creatinine, Ser: 0.4 mg/dL — ABNORMAL LOW (ref 0.44–1.00)
GFR calc Af Amer: >60 mL/min (ref >60)
GFR calc Af Amer: >60 mL/min (ref >60)
GFR calc non Af Amer: >60 mL/min (ref >60)
GLUCOSE: 93 mg/dL (ref 65–99)
Glucose, Bld: 86 mg/dL (ref 65–99)
POTASSIUM: 3.4 mmol/L — AB (ref 3.5–5.1)
POTASSIUM: 3.3 mmol/L — AB (ref 3.5–5.1)
Sodium: 141 mmol/L (ref 135–145)
Sodium: 141 mmol/L (ref 135–145)
TOTAL PROTEIN: 8.1 g/dL (ref 6.5–8.1)
Total Protein: 7.6 g/dL (ref 6.5–8.1)

## 2015-12-19 LAB — RETICULOCYTES
RBC.: 3.13 MIL/uL — AB (ref 3.87–5.11)
RBC.: 2.83 MIL/uL — AB (ref 3.87–5.11)
RETIC CT PCT: 7.6 % — AB (ref 0.4–3.1)
Retic Count, Absolute: 237.9 10*3/uL — ABNORMAL HIGH (ref 19.0–186.0)
Retic Count, Absolute: 217.9 10*3/uL — ABNORMAL HIGH (ref 19.0–186.0)
Retic Ct Pct: 7.7 % — ABNORMAL HIGH (ref 0.4–3.1)

## 2015-12-19 LAB — URINALYSIS, ROUTINE W REFLEX MICROSCOPIC
Bilirubin Urine: NEGATIVE
Glucose, UA: NEGATIVE mg/dL
Hgb urine dipstick: NEGATIVE
KETONES UR: NEGATIVE mg/dL
LEUKOCYTES UA: NEGATIVE
NITRITE: NEGATIVE
PH: 6.5 (ref 5.0–8.0)
PROTEIN: NEGATIVE mg/dL
SPECIFIC GRAVITY, URINE: 1.007 (ref 1.005–1.030)

## 2015-12-19 LAB — LACTATE DEHYDROGENASE: LDH: 233 U/L — ABNORMAL HIGH (ref 98–192)

## 2015-12-19 LAB — MAGNESIUM: Magnesium: 1.9 mg/dL (ref 1.7–2.4)

## 2015-12-19 MED ORDER — DEXTROSE-NACL 5-0.45 % IV SOLN
INTRAVENOUS | Status: DC
  Administered 2015-12-19 – 2015-12-20 (×2): via INTRAVENOUS
  Administered 2015-12-20 – 2015-12-21 (×3): 1000 mL via INTRAVENOUS
  Administered 2015-12-21 – 2015-12-22 (×2): via INTRAVENOUS
  Administered 2015-12-22: 1000 mL via INTRAVENOUS
  Administered 2015-12-23 – 2015-12-24 (×3): via INTRAVENOUS

## 2015-12-19 MED ORDER — IBUPROFEN 800 MG PO TABS: 800.0000 mg | ORAL_TABLET | Freq: Three times a day (TID) | ORAL | Status: DC | PRN

## 2015-12-19 MED ORDER — HYDROMORPHONE HCL 2 MG/ML IJ SOLN
0.0250 mg/kg | INTRAMUSCULAR | Status: AC
  Administered 2015-12-19: 2.1 mg via INTRAVENOUS
  Filled 2015-12-19: qty 2

## 2015-12-19 MED ORDER — KETOROLAC TROMETHAMINE 30 MG/ML IJ SOLN
30.0000 mg | INTRAMUSCULAR | Status: AC
  Administered 2015-12-19: 30 mg via INTRAVENOUS
  Filled 2015-12-19: qty 1

## 2015-12-19 MED ORDER — PROMETHAZINE HCL 25 MG PO TABS
12.5000 mg | ORAL_TABLET | ORAL | Status: DC | PRN
  Administered 2015-12-19: 25 mg via ORAL
  Filled 2015-12-19: qty 1

## 2015-12-19 MED ORDER — HYDROMORPHONE HCL 2 MG/ML IJ SOLN: 0.0375 mg/kg | INTRAMUSCULAR | Status: AC

## 2015-12-19 MED ORDER — LORAZEPAM 0.5 MG PO TABS
0.5000 mg | ORAL_TABLET | ORAL | Status: DC | PRN
Start: 2015-12-19 — End: 2015-12-25

## 2015-12-19 MED ORDER — LORAZEPAM 2 MG/ML IJ SOLN: 0.5000 mg | INTRAMUSCULAR | Status: DC | PRN

## 2015-12-19 MED ORDER — SODIUM CHLORIDE 0.45 % IV SOLN: INTRAVENOUS | Status: DC

## 2015-12-19 MED ORDER — SODIUM CHLORIDE 0.9% FLUSH: 9.0000 mL | INTRAVENOUS | Status: DC | PRN

## 2015-12-19 MED ORDER — SODIUM CHLORIDE 0.45 % IV SOLN
INTRAVENOUS | Status: DC
  Administered 2015-12-19 (×2): via INTRAVENOUS

## 2015-12-19 MED ORDER — HYDROXYZINE HCL 25 MG PO TABS
25.0000 mg | ORAL_TABLET | ORAL | Status: DC | PRN
  Administered 2015-12-22: 50 mg via ORAL
  Filled 2015-12-19 (×2): qty 2

## 2015-12-19 MED ORDER — HYDROCODONE-ACETAMINOPHEN 5-325 MG PO TABS: 1.0000 | ORAL_TABLET | ORAL | Status: DC | PRN

## 2015-12-19 MED ORDER — FOLIC ACID 1 MG PO TABS
1.0000 mg | ORAL_TABLET | Freq: Every day | ORAL | Status: DC
  Administered 2015-12-19 – 2015-12-25 (×7): 1 mg via ORAL
  Filled 2015-12-19 (×7): qty 1

## 2015-12-19 MED ORDER — HYDROMORPHONE HCL 2 MG/ML IJ SOLN: 0.0250 mg/kg | INTRAMUSCULAR | Status: AC

## 2015-12-19 MED ORDER — HYDROMORPHONE HCL 2 MG/ML IJ SOLN
0.0375 mg/kg | INTRAMUSCULAR | Status: AC
  Administered 2015-12-19: 3 mg via INTRAVENOUS
  Filled 2015-12-19: qty 2

## 2015-12-19 MED ORDER — ONDANSETRON HCL 4 MG/2ML IJ SOLN
4.0000 mg | Freq: Four times a day (QID) | INTRAMUSCULAR | Status: DC | PRN
  Administered 2015-12-19 – 2015-12-20 (×2): 4 mg via INTRAVENOUS
  Filled 2015-12-19 (×3): qty 2

## 2015-12-19 MED ORDER — HYDROMORPHONE HCL 1 MG/ML IJ SOLN
1.0000 mg | INTRAMUSCULAR | Status: DC | PRN
  Administered 2015-12-19 – 2015-12-22 (×7): 1 mg via INTRAVENOUS
  Filled 2015-12-19 (×6): qty 1

## 2015-12-19 MED ORDER — HYDROMORPHONE 1 MG/ML IV SOLN
INTRAVENOUS | Status: DC
  Administered 2015-12-19: 14:00:00 via INTRAVENOUS
  Administered 2015-12-19: 4 mg via INTRAVENOUS
  Administered 2015-12-19 – 2015-12-20 (×2): 1 mg via INTRAVENOUS
  Administered 2015-12-20: 2.5 mg via INTRAVENOUS
  Administered 2015-12-20: 4 mg via INTRAVENOUS
  Administered 2015-12-20: 25 mg via INTRAVENOUS
  Administered 2015-12-20: 4.5 mg via INTRAVENOUS
  Administered 2015-12-20: 3.5 mg via INTRAVENOUS
  Administered 2015-12-20: 1 mg via INTRAVENOUS
  Administered 2015-12-21: 4.5 mg via INTRAVENOUS
  Administered 2015-12-21: 1.98 mg via INTRAVENOUS
  Administered 2015-12-21: 3.5 mg via INTRAVENOUS
  Administered 2015-12-21: 5 mg via INTRAVENOUS
  Administered 2015-12-21: 3 mg via INTRAVENOUS
  Administered 2015-12-21: 3.5 mg via INTRAVENOUS
  Administered 2015-12-22: 6 mg via INTRAVENOUS
  Administered 2015-12-22: 5.5 mg via INTRAVENOUS
  Administered 2015-12-22: 3.68 mg via INTRAVENOUS
  Administered 2015-12-22: 01:00:00 via INTRAVENOUS
  Administered 2015-12-22 – 2015-12-23 (×2): 2 mg via INTRAVENOUS
  Administered 2015-12-23: 1.5 mg via INTRAVENOUS
  Administered 2015-12-24: 11 mg via INTRAVENOUS
  Administered 2015-12-24: 01:00:00 via INTRAVENOUS
  Administered 2015-12-24: 12.5 mg via INTRAVENOUS
  Administered 2015-12-24: 11.5 mg via INTRAVENOUS
  Administered 2015-12-24: 4.85 mg via INTRAVENOUS
  Administered 2015-12-24: 15:00:00 via INTRAVENOUS
  Administered 2015-12-24: 5.75 mg via INTRAVENOUS
  Administered 2015-12-25: 7.94 mg via INTRAVENOUS
  Administered 2015-12-25: 0.5 mg via INTRAVENOUS
  Administered 2015-12-25: 5 mg via INTRAVENOUS
  Administered 2015-12-25: 4 mg via INTRAVENOUS
  Administered 2015-12-25: 10:00:00 via INTRAVENOUS
  Filled 2015-12-19 (×8): qty 25

## 2015-12-19 MED ORDER — ENOXAPARIN SODIUM 40 MG/0.4ML ~~LOC~~ SOLN
40.0000 mg | SUBCUTANEOUS | Status: DC
  Administered 2015-12-19 – 2015-12-24 (×6): 40 mg via SUBCUTANEOUS
  Filled 2015-12-19 (×6): qty 0.4

## 2015-12-19 MED ORDER — PROMETHAZINE HCL 25 MG PO TABS: 25.0000 mg | ORAL_TABLET | ORAL | Status: DC | PRN

## 2015-12-19 MED ORDER — SENNOSIDES-DOCUSATE SODIUM 8.6-50 MG PO TABS
1.0000 | ORAL_TABLET | Freq: Two times a day (BID) | ORAL | Status: DC
  Administered 2015-12-19 – 2015-12-25 (×13): 1 via ORAL
  Filled 2015-12-19 (×13): qty 1

## 2015-12-19 MED ORDER — DIPHENHYDRAMINE HCL 25 MG PO CAPS
25.0000 mg | ORAL_CAPSULE | ORAL | Status: DC | PRN
  Administered 2015-12-21: 25 mg via ORAL
  Administered 2015-12-23 – 2015-12-24 (×3): 50 mg via ORAL
  Filled 2015-12-19: qty 1
  Filled 2015-12-19: qty 2
  Filled 2015-12-19: qty 1
  Filled 2015-12-19 (×2): qty 2

## 2015-12-19 MED ORDER — HYDROMORPHONE HCL 2 MG/ML IJ SOLN
2.0000 mg | Freq: Once | INTRAMUSCULAR | Status: AC
  Administered 2015-12-19: 2 mg via INTRAVENOUS
  Filled 2015-12-19: qty 1

## 2015-12-19 MED ORDER — POLYETHYLENE GLYCOL 3350 17 G PO PACK
17.0000 g | PACK | Freq: Every day | ORAL | Status: DC | PRN
  Administered 2015-12-23: 17 g via ORAL
  Filled 2015-12-19 (×2): qty 1

## 2015-12-19 MED ORDER — NALOXONE HCL 0.4 MG/ML IJ SOLN: 0.4000 mg | INTRAMUSCULAR | Status: DC | PRN

## 2015-12-19 MED ORDER — PROMETHAZINE HCL 12.5 MG RE SUPP
12.5000 mg | RECTAL | Status: DC | PRN
  Filled 2015-12-19: qty 2

## 2015-12-19 NOTE — ED Notes (Signed)
Patient arrives with sickle cell crisis, states pain started last Tuesday and patient states she is out of her percocet since last Wednesday.  Generalized pain.

## 2015-12-19 NOTE — ED Notes (Signed)
Bed: WA04 Expected date:  Expected time:  Means of arrival:  Comments: EMS 25 yo female sickle cell/ran out of meds

## 2015-12-19 NOTE — ED Provider Notes (Signed)
CSN: [id number]     Arrival date & time 12/19/15  [id number] History  By signing my name below, I, Nicole Kindred, attest that this documentation has been prepared under the direction and in the presence of Orpah Greek, MD.   Electronically Signed: Nicole Kindred, ED Scribe. 12/19/2015. 7:05 AM     Chief Complaint  Patient presents with  . Sickle Cell Pain Crisis     The history is provided by the patient. No language interpreter was used.   HPI Comments: Carmen [name] is a 25 y.o. female with PMHx of sickle cell crisis who presents to the Emergency Department complaining of gradual onset, constant arm and leg pain, onset about two weeks ago, worsening earlier tonight. She reports that the pain is similar to pain she has had in the past caused by her sickle cell disease. Pt states that she needs a prescription refill for her usual pain medication. No worsening or alleviating factors were noted. Pt denies chest pain, fever, chills, or any other pertinent symptoms.    Past Medical History  Diagnosis Date  . Sickle cell crisis ([place])   . Headache(784.0)   . Abortion     05/2012   Past Surgical History  Procedure Laterality Date  . Splenectomy    . Cholecystectomy N/A 11/30/2014    Procedure: LAPAROSCOPIC CHOLECYSTECTOMY SINGLE SITE WITH INTRAOPERATIVE CHOLANGIOGRAM;  Surgeon: Michael Boston, MD;  Location: [facility];  Service: General;  Laterality: N/A;   Family History  Problem Relation Age of Onset  . Hypertension Mother   . Sickle cell anemia Sister   . Kidney disease Sister     Lupus  . Arthritis Sister   . Sickle cell anemia Sister   . Sickle cell trait Sister   . Heart disease Maternal Aunt   . Heart disease Maternal Uncle    Social History  Substance Use Topics  . Smoking status: Never Smoker   . Smokeless tobacco: Never Used  . Alcohol Use: No   OB History    Gravida Para Term Preterm AB TAB SAB Ectopic Multiple Living   1    1          Review of Systems   Constitutional: Negative for fever and chills.  Respiratory: Negative for chest tightness and shortness of breath.   Cardiovascular: Negative for chest pain.  Musculoskeletal: Positive for myalgias and arthralgias.    Allergies  Review of patient's allergies indicates no known allergies.  Home Medications   Prior to Admission medications   Medication Sig Start Date End Date Taking? Authorizing Provider  ibuprofen (ADVIL,MOTRIN) 800 MG tablet Take 800 mg by mouth 3 (three) times daily as needed for headache or moderate pain. pain 12/01/15  Yes Historical Provider, MD  naproxen (NAPROSYN) 375 MG tablet Take 375 mg by mouth 2 (two) times daily as needed for mild pain or moderate pain.  10/30/15  Yes Historical Provider, MD  oxyCODONE-acetaminophen (PERCOCET) 10-325 MG tablet Take 1 tablet by mouth every 4 (four) hours as needed for pain. Hold until Oral Dilaudid completed. Then resume as prescribed. 11/17/15  Yes Leana Gamer, MD  cyclobenzaprine (FLEXERIL) 10 MG tablet Take 1 tablet (10 mg total) by mouth 3 (three) times daily as needed for muscle spasms. Patient not taking: Reported on 12/19/2015 11/17/15   Leana Gamer, MD  folic acid (FOLVITE) 1 MG tablet Take 1 tablet (1 mg total) by mouth daily. Patient not taking: Reported on 10/17/2015 09/14/15   Mohammad L  Jonelle Sidle, MD  HYDROmorphone (DILAUDID) 4 MG tablet Take 1 tablet (4 mg total) by mouth every 4 (four) hours. Patient not taking: Reported on 12/06/2015 11/22/15   Deno Etienne, DO  potassium chloride SA (K-DUR,KLOR-CON) 20 MEQ tablet Take 1 tablet (20 mEq total) by mouth 2 (two) times daily. Patient not taking: Reported on 10/07/2015 09/23/15   Rolland Porter, MD   BP 133/88 mmHg  Pulse 103  Temp(Src) 98.4 F (36.9 C) (Oral)  Resp 18  Wt 178 lb (80.74 kg)  SpO2 100%  LMP 11/27/2015 Physical Exam  Constitutional: She is oriented to person, place, and time. She appears well-developed and well-nourished. No distress.  HENT:   Head: Normocephalic and atraumatic.  Right Ear: Hearing normal.  Left Ear: Hearing normal.  Nose: Nose normal.  Mouth/Throat: Oropharynx is clear and moist and mucous membranes are normal.  Eyes: Conjunctivae and EOM are normal. Pupils are equal, round, and reactive to light.  Neck: Normal range of motion. Neck supple.  Cardiovascular: Normal rate, regular rhythm, S1 normal, S2 normal and normal heart sounds.  Exam reveals no gallop and no friction rub.   No murmur heard. Pulmonary/Chest: Effort normal and breath sounds normal. No respiratory distress. She exhibits no tenderness.  Abdominal: Soft. Normal appearance and bowel sounds are normal. There is no hepatosplenomegaly. There is no tenderness. There is no rebound, no guarding, no tenderness at McBurney's point and negative Murphy's sign. No hernia.  Musculoskeletal: Normal range of motion.  Neurological: She is alert and oriented to person, place, and time. She has normal strength. No cranial nerve deficit or sensory deficit. Coordination normal. GCS eye subscore is 4. GCS verbal subscore is 5. GCS motor subscore is 6.  Skin: Skin is warm, dry and intact. No rash noted. No cyanosis.  Psychiatric: She has a normal mood and affect. Her speech is normal and behavior is normal. Thought content normal.  Nursing note and vitals reviewed.  DIAGNOSTIC STUDIES: Oxygen Saturation is 100% on RA, normal by my interpretation.    COORDINATION OF CARE: 3:40 AM-Discussed treatment plan which includes CBC with differential, CMP, and dilaudid with pt at bedside and pt agreed to plan.     ED Course  Procedures (including critical care time) Labs Review Labs Reviewed  COMPREHENSIVE METABOLIC PANEL - Abnormal; Notable for the following:    Potassium 3.4 (*)    BUN <5 (*)    Creatinine, Ser 0.34 (*)    Total Bilirubin 1.5 (*)    All other components within normal limits  CBC WITH DIFFERENTIAL/PLATELET - Abnormal; Notable for the following:     WBC 11.8 (*)    RBC 3.13 (*)    Hemoglobin 9.3 (*)    HCT 26.3 (*)    RDW 16.9 (*)    Platelets 537 (*)    Neutro Abs 7.8 (*)    All other components within normal limits  RETICULOCYTES - Abnormal; Notable for the following:    Retic Ct Pct 7.6 (*)    RBC. 3.13 (*)    Retic Count, Manual 237.9 (*)    All other components within normal limits    Imaging Review No results found. I have personally reviewed and evaluated these images and lab results as part of my medical decision-making.   EKG Interpretation None      MDM   Final diagnoses:  Acute sickle cell crisis ([facility])   Presents to the ER for evaluation of acute sickle cell crisis. Patient reports that she is out  of her Percocet and her doctor has not been able to refill it for her. She has been seen in the ER several times. Patient experiencing a typical crisis for her complaining of severe pain in her arms, legs and lower back. No chest pain or shortness of breath. No concern for acute chest. Patient has been treated by protocol with multiple doses of Dilaudid, Toradol and has had no improvement. Will ask for admission.  I personally performed the services described in this documentation, which was scribed in my presence. The recorded information has been reviewed and is accurate.      Orpah Greek, MD 12/19/15 [phone]

## 2015-12-19 NOTE — H&P (Signed)
Carmen [name] is an 25 y.o. female.   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 She had tooth abscess which triggered her attack. She took more of her home pain medications and run out 4 days ago. She said she called her physician to complain but did not receive extra pain medications. She now has 10/10 pain which is not relieved by the Dilaudid injections she received in the ER. She denies SOB, no Cough, No NVD. She is becoming Opiate Tolerant over the last few months. She was naive for the most part.  Past Medical History  Diagnosis Date  . Sickle cell crisis ([place])   . Headache(784.0)   . Abortion     05/2012    Past Surgical History  Procedure Laterality Date  . Splenectomy    . Cholecystectomy N/A 11/30/2014    Procedure: LAPAROSCOPIC CHOLECYSTECTOMY SINGLE SITE WITH INTRAOPERATIVE CHOLANGIOGRAM;  Surgeon: Michael Boston, MD;  Location: [facility];  Service: General;  Laterality: N/A;    Family History  Problem Relation Age of Onset  . Hypertension Mother   . Sickle cell anemia Sister   . Kidney disease Sister     Lupus  . Arthritis Sister   . Sickle cell anemia Sister   . Sickle cell trait Sister   . Heart disease Maternal Aunt   . Heart disease Maternal Uncle    Social History:  reports that she has never smoked. She has never used smokeless tobacco. She reports that she does not drink alcohol or use illicit drugs.  Allergies: No Known Allergies   (Not in a hospital admission)  Results for orders placed or performed during the hospital encounter of 12/19/15 (from the past 48 hour(s))  Comprehensive metabolic panel     Status: Abnormal   Collection Time: 12/19/15  3:45 AM  Result Value Ref Range   Sodium 141 135 - 145 mmol/L   Potassium 3.4 (L) 3.5 - 5.1 mmol/L   Chloride 108 101 - 111 mmol/L   CO2 23 22 - 32 mmol/L   Glucose, Bld 86 65 - 99 mg/dL   BUN <5 (L)  6 - 20 mg/dL   Creatinine, Ser 0.34 (L) 0.44 - 1.00 mg/dL   Calcium 9.4 8.9 - 10.3 mg/dL   Total Protein 8.1 6.5 - 8.1 g/dL   Albumin 4.6 3.5 - 5.0 g/dL   AST 27 15 - 41 U/L   ALT 17 14 - 54 U/L   Alkaline Phosphatase 62 38 - 126 U/L   Total Bilirubin 1.5 (H) 0.3 - 1.2 mg/dL   GFR calc non Af Amer >60 >60 mL/min   GFR calc Af Amer >60 >60 mL/min    Comment: (NOTE) The eGFR has been calculated using the CKD EPI equation. This calculation has not been validated in all clinical situations. eGFR's persistently <60 mL/min signify possible Chronic Kidney Disease.    Anion gap 10 5 - 15  CBC with Differential     Status: Abnormal   Collection Time: 12/19/15  3:45 AM  Result Value Ref Range   WBC 11.8 (H) 4.0 - 10.5 K/uL   RBC 3.13 (L) 3.87 - 5.11 MIL/uL   Hemoglobin 9.3 (L) 12.0 - 15.0 g/dL   HCT 26.3 (L) 36.0 - 46.0 %   MCV 84.0 78.0 - 100.0 fL   MCH 29.7 26.0 - 34.0 pg  MCHC 35.4 30.0 - 36.0 g/dL   RDW 16.9 (H) 11.5 - 15.5 %   Platelets 537 (H) 150 - 400 K/uL   Neutrophils Relative % 66 %   Neutro Abs 7.8 (H) 1.7 - 7.7 K/uL   Lymphocytes Relative 25 %   Lymphs Abs 2.9 0.7 - 4.0 K/uL   Monocytes Relative 9 %   Monocytes Absolute 1.0 0.1 - 1.0 K/uL   Eosinophils Relative 0 %   Eosinophils Absolute 0.0 0.0 - 0.7 K/uL   Basophils Relative 0 %   Basophils Absolute 0.0 0.0 - 0.1 K/uL  Reticulocytes     Status: Abnormal   Collection Time: 12/19/15  3:45 AM  Result Value Ref Range   Retic Ct Pct 7.6 (H) 0.4 - 3.1 %   RBC. 3.13 (L) 3.87 - 5.11 MIL/uL   Retic Count, Manual 237.9 (H) 19.0 - 186.0 K/uL  Urinalysis, Routine w reflex microscopic (not at [facility])     Status: Abnormal   Collection Time: 12/19/15  7:52 AM  Result Value Ref Range   Color, Urine YELLOW YELLOW   APPearance CLOUDY (A) CLEAR   Specific Gravity, Urine 1.007 1.005 - 1.030   pH 6.5 5.0 - 8.0   Glucose, UA NEGATIVE NEGATIVE mg/dL   Hgb urine dipstick NEGATIVE NEGATIVE   Bilirubin Urine NEGATIVE NEGATIVE    Ketones, ur NEGATIVE NEGATIVE mg/dL   Protein, ur NEGATIVE NEGATIVE mg/dL   Nitrite NEGATIVE NEGATIVE   Leukocytes, UA NEGATIVE NEGATIVE    Comment: MICROSCOPIC NOT DONE ON URINES WITH NEGATIVE PROTEIN, BLOOD, LEUKOCYTES, NITRITE, OR GLUCOSE <1000 mg/dL.  Comprehensive metabolic panel     Status: Abnormal   Collection Time: 12/19/15  8:18 AM  Result Value Ref Range   Sodium 141 135 - 145 mmol/L   Potassium 3.3 (L) 3.5 - 5.1 mmol/L   Chloride 108 101 - 111 mmol/L   CO2 25 22 - 32 mmol/L   Glucose, Bld 93 65 - 99 mg/dL   BUN <5 (L) 6 - 20 mg/dL   Creatinine, Ser 0.40 (L) 0.44 - 1.00 mg/dL   Calcium 8.9 8.9 - 10.3 mg/dL   Total Protein 7.6 6.5 - 8.1 g/dL   Albumin 4.2 3.5 - 5.0 g/dL   AST 25 15 - 41 U/L   ALT 17 14 - 54 U/L   Alkaline Phosphatase 57 38 - 126 U/L   Total Bilirubin 2.0 (H) 0.3 - 1.2 mg/dL   GFR calc non Af Amer >60 >60 mL/min   GFR calc Af Amer >60 >60 mL/min    Comment: (NOTE) The eGFR has been calculated using the CKD EPI equation. This calculation has not been validated in all clinical situations. eGFR's persistently <60 mL/min signify possible Chronic Kidney Disease.    Anion gap 8 5 - 15  Lactate dehydrogenase     Status: Abnormal   Collection Time: 12/19/15  8:18 AM  Result Value Ref Range   LDH 233 (H) 98 - 192 U/L  Magnesium     Status: None   Collection Time: 12/19/15  8:18 AM  Result Value Ref Range   Magnesium 1.9 1.7 - 2.4 mg/dL  CBC WITH DIFFERENTIAL     Status: Abnormal   Collection Time: 12/19/15  8:18 AM  Result Value Ref Range   WBC 13.5 (H) 4.0 - 10.5 K/uL   RBC 2.83 (L) 3.87 - 5.11 MIL/uL   Hemoglobin 8.3 (L) 12.0 - 15.0 g/dL   HCT 23.8 (L) 36.0 - 46.0 %  MCV 84.1 78.0 - 100.0 fL   MCH 29.3 26.0 - 34.0 pg   MCHC 34.9 30.0 - 36.0 g/dL   RDW 16.4 (H) 11.5 - 15.5 %   Platelets 579 (H) 150 - 400 K/uL   Neutrophils Relative % 49 %   Neutro Abs 6.5 1.7 - 7.7 K/uL   Lymphocytes Relative 42 %   Lymphs Abs 5.7 (H) 0.7 - 4.0 K/uL    Monocytes Relative 9 %   Monocytes Absolute 1.3 (H) 0.1 - 1.0 K/uL   Eosinophils Relative 0 %   Eosinophils Absolute 0.0 0.0 - 0.7 K/uL   Basophils Relative 0 %   Basophils Absolute 0.0 0.0 - 0.1 K/uL  Reticulocytes     Status: Abnormal   Collection Time: 12/19/15  8:18 AM  Result Value Ref Range   Retic Ct Pct 7.7 (H) 0.4 - 3.1 %   RBC. 2.83 (L) 3.87 - 5.11 MIL/uL   Retic Count, Manual 217.9 (H) 19.0 - 186.0 K/uL   No results found.  Review of Systems  Constitutional: Negative.   HENT: Negative.   Eyes: Negative.   Respiratory: Negative.   Cardiovascular: Negative.   Gastrointestinal: Negative.   Genitourinary: Negative.   Musculoskeletal: Positive for myalgias, back pain and joint pain.  Skin: Negative.   Neurological: Negative.   Endo/Heme/Allergies: Negative.   Psychiatric/Behavioral: Negative.     Blood pressure 117/84, pulse 93, temperature 97.4 F (36.3 C), temperature source Oral, resp. rate 16, weight 80.74 kg (178 lb), last menstrual period 11/27/2015, SpO2 99 %. Physical Exam  Constitutional: She is oriented to person, place, and time. She appears well-developed and well-nourished.  HENT:  Head: Normocephalic and atraumatic.  Right Ear: External ear normal.  Left Ear: External ear normal.  Eyes: Conjunctivae are normal. Pupils are equal, round, and reactive to light.  Neck: Normal range of motion. Neck supple.  Cardiovascular: Normal rate, regular rhythm and normal heart sounds.   Respiratory: Effort normal and breath sounds normal.  GI: Soft.  Musculoskeletal: Normal range of motion.  Neurological: She is alert and oriented to person, place, and time.  Skin: Skin is warm and dry.     Assessment/Plan A 25 YO wopman admitted with sickle cell painful crisis.  1. Sickle Cell Painful Crisis: patient will be admitted for pain control. Will place on Dilaudid PCA, Ibuprofen, hydration and pain assessment daily. Will adjust medications accordingly.  2. Sickle  cell anemia: H/H at baseline. Will follow closely and transfuse PRBC if indicated  3. Recent tooth infection: Per patient,. Now better. No antibiotics at this point.  4. Dehydration: will hydrate.  Barbette Merino, MD 12/19/2015, 10:36 AM

## 2015-12-19 NOTE — ED Notes (Signed)
Patient ambulatory to restroom  ?

## 2015-12-20 LAB — COMPREHENSIVE METABOLIC PANEL
ALT: 18 U/L (ref 14–54)
AST: 28 U/L (ref 15–41)
Albumin: 3.9 g/dL (ref 3.5–5.0)
Alkaline Phosphatase: 55 U/L (ref 38–126)
Anion gap: 7 (ref 5–15)
BUN: <5 mg/dL — ABNORMAL LOW (ref 6–20)
CO2: 28 mmol/L (ref 22–32)
CREATININE: 0.36 mg/dL — AB (ref 0.44–1.00)
Calcium: 9.3 mg/dL (ref 8.9–10.3)
Chloride: 105 mmol/L (ref 101–111)
GFR calc non Af Amer: >60 mL/min (ref >60)
Glucose, Bld: 112 mg/dL — ABNORMAL HIGH (ref 65–99)
POTASSIUM: 3.1 mmol/L — AB (ref 3.5–5.1)
Sodium: 140 mmol/L (ref 135–145)
Total Bilirubin: 1.8 mg/dL — ABNORMAL HIGH (ref 0.3–1.2)
Total Protein: 7.3 g/dL (ref 6.5–8.1)

## 2015-12-20 LAB — CBC WITH DIFFERENTIAL/PLATELET
Basophils Absolute: 0 10*3/uL (ref 0.0–0.1)
Basophils Relative: 0 %
EOS ABS: 0.2 10*3/uL (ref 0.0–0.7)
Eosinophils Relative: 2 %
HEMATOCRIT: 23.1 % — AB (ref 36.0–46.0)
HEMOGLOBIN: 8.1 g/dL — AB (ref 12.0–15.0)
LYMPHS ABS: 4.3 10*3/uL — AB (ref 0.7–4.0)
LYMPHS PCT: 49 %
MCH: 29.3 pg (ref 26.0–34.0)
MCHC: 35.1 g/dL (ref 30.0–36.0)
MCV: 83.7 fL (ref 78.0–100.0)
MONOS PCT: 12 %
Monocytes Absolute: 1.1 10*3/uL — ABNORMAL HIGH (ref 0.1–1.0)
NEUTROS PCT: 37 %
Neutro Abs: 3.2 10*3/uL (ref 1.7–7.7)
Platelets: 573 10*3/uL — ABNORMAL HIGH (ref 150–400)
RBC: 2.76 MIL/uL — ABNORMAL LOW (ref 3.87–5.11)
RDW: 16.9 % — ABNORMAL HIGH (ref 11.5–15.5)
WBC: 8.7 10*3/uL (ref 4.0–10.5)

## 2015-12-20 NOTE — Progress Notes (Signed)
Utilization review completed.  

## 2015-12-20 NOTE — Progress Notes (Signed)
Subjective: Patient still complains of 9/10 pain mainly in her arms. Has been on Dilaudid PCA and has used 10.5 mg with 25 demands and 21 deliveries. Also has physician assisted dosing at 1mg  Q3hrs. No SOB, No cough, no NVD. Patient has Ibuprofen ordered as well.   Objective: Vital signs in last 24 hours: Temp:  [97.9 F (36.6 C)-98.6 F (37 C)] 98.6 F (37 C) (02/18 0545) Pulse Rate:  [75-96] 84 (02/18 0545) Resp:  [10-20] 14 (02/18 0545) BP: (117-130)/(64-86) 119/64 mmHg (02/18 0545) SpO2:  [95 %-100 %] 100 % (02/18 0545) FiO2 (%):  [95 %-100 %] 100 % (02/18 0400) Weight:  [81.7 kg (180 lb 1.9 oz)-82.192 kg (181 lb 3.2 oz)] 81.7 kg (180 lb 1.9 oz) (02/18 0545) Weight change: 1.452 kg (3 lb 3.2 oz) Last BM Date: 12/18/15  Intake/Output from previous day: 02/17 0701 - 02/18 0700 In: -  Out: 100 [Emesis/NG output:100] Intake/Output this shift: Total I/O In: 821 [I.V.:821] Out: -   General appearance: alert, cooperative and no distress Head: Normocephalic, without obvious abnormality, atraumatic Neck: no adenopathy, no carotid bruit, no JVD, supple, symmetrical, trachea midline and thyroid not enlarged, symmetric, no tenderness/mass/nodules Back: symmetric, no curvature. ROM normal. No CVA tenderness. Resp: normal percussion bilaterally Chest wall: no tenderness Cardio: regular rate and rhythm, S1, S2 normal, no murmur, click, rub or gallop GI: soft, non-tender; bowel sounds normal; no masses,  no organomegaly Extremities: extremities normal, atraumatic, no cyanosis or edema Pulses: 2+ and symmetric Skin: Skin color, texture, turgor normal. No rashes or lesions Neurologic: Grossly normal  Lab Results:  Recent Labs  12/19/15 0818 12/20/15 0415  WBC 13.5* 8.7  HGB 8.3* 8.1*  HCT 23.8* 23.1*  PLT 579* 573*   BMET  Recent Labs  12/19/15 0818 12/20/15 0415  NA 141 140  K 3.3* 3.1*  CL 108 105  CO2 25 28  GLUCOSE 93 112*  BUN <5* <5*  CREATININE 0.40* 0.36*   CALCIUM 8.9 9.3    Studies/Results: No results found.  Medications: I have reviewed the patient's current medications.  Assessment/Plan: A 25 YO wopman admitted with sickle cell painful crisis.  1. Sickle Cell Painful Crisis: Will continue Dilaudid PCA, Ibuprofen, hydration and pain assessment daily. Will adjust medications accordingly. Continue Physician assisted dosing for now.  2. Sickle cell anemia: H/H at baseline. Will follow closely and transfuse PRBC if indicated  3. Recent tooth infection: Per patient,. Now better. No antibiotics at this point.  4. Dehydration: will hydrate.   5. Hypokalemia: Will replete  6. Leucocytosis: resolved. Probably due SS crisis  LOS: 1 day   Nakeita Styles,LAWAL 12/20/2015, 8:44 AM

## 2015-12-21 LAB — URINALYSIS, ROUTINE W REFLEX MICROSCOPIC
BILIRUBIN URINE: NEGATIVE
GLUCOSE, UA: NEGATIVE mg/dL
Hgb urine dipstick: NEGATIVE
KETONES UR: NEGATIVE mg/dL
LEUKOCYTES UA: NEGATIVE
NITRITE: NEGATIVE
PH: 7 (ref 5.0–8.0)
PROTEIN: NEGATIVE mg/dL
Specific Gravity, Urine: 1.004 — ABNORMAL LOW (ref 1.005–1.030)

## 2015-12-21 LAB — CBC WITH DIFFERENTIAL/PLATELET
BASOS ABS: 0 10*3/uL (ref 0.0–0.1)
Basophils Relative: 0 %
Eosinophils Absolute: 0.3 10*3/uL (ref 0.0–0.7)
Eosinophils Relative: 3 %
HCT: 24.8 % — ABNORMAL LOW (ref 36.0–46.0)
HEMOGLOBIN: 8.7 g/dL — AB (ref 12.0–15.0)
LYMPHS ABS: 5.7 10*3/uL — AB (ref 0.7–4.0)
LYMPHS PCT: 56 %
MCH: 29.5 pg (ref 26.0–34.0)
MCHC: 35.1 g/dL (ref 30.0–36.0)
MCV: 84.1 fL (ref 78.0–100.0)
Monocytes Absolute: 1.5 10*3/uL — ABNORMAL HIGH (ref 0.1–1.0)
Monocytes Relative: 15 %
NEUTROS ABS: 2.6 10*3/uL (ref 1.7–7.7)
NEUTROS PCT: 26 %
Platelets: 517 10*3/uL — ABNORMAL HIGH (ref 150–400)
RBC: 2.95 MIL/uL — AB (ref 3.87–5.11)
RDW: 16.9 % — ABNORMAL HIGH (ref 11.5–15.5)
WBC: 10 10*3/uL (ref 4.0–10.5)

## 2015-12-21 LAB — COMPREHENSIVE METABOLIC PANEL
ALK PHOS: 54 U/L (ref 38–126)
ALT: 18 U/L (ref 14–54)
AST: 26 U/L (ref 15–41)
Albumin: 4.1 g/dL (ref 3.5–5.0)
Anion gap: 9 (ref 5–15)
CALCIUM: 9.3 mg/dL (ref 8.9–10.3)
CHLORIDE: 103 mmol/L (ref 101–111)
CO2: 27 mmol/L (ref 22–32)
CREATININE: 0.37 mg/dL — AB (ref 0.44–1.00)
Glucose, Bld: 104 mg/dL — ABNORMAL HIGH (ref 65–99)
Potassium: 3.3 mmol/L — ABNORMAL LOW (ref 3.5–5.1)
SODIUM: 139 mmol/L (ref 135–145)
Total Bilirubin: 1.4 mg/dL — ABNORMAL HIGH (ref 0.3–1.2)
Total Protein: 7.3 g/dL (ref 6.5–8.1)

## 2015-12-21 MED ORDER — POTASSIUM CHLORIDE CRYS ER 20 MEQ PO TBCR
40.0000 meq | EXTENDED_RELEASE_TABLET | Freq: Once | ORAL | Status: AC
  Administered 2015-12-21: 40 meq via ORAL
  Filled 2015-12-21: qty 2

## 2015-12-21 NOTE — Progress Notes (Signed)
Subjective: Patient still complains of 9/10 pain mainly in her lower back The arms are getting better. She also complained of burning sensation with urination. Marland Kitchen Has been on Dilaudid PCA and has used 19.5 mg with 37 demands and 37 deliveries. Also has physician assisted dosing at 1mg  Q3hrs. No SOB, No cough, no NVD. Patient has Ibuprofen ordered as well.   Objective: Vital signs in last 24 hours: Temp:  [98.3 F (36.8 C)-98.8 F (37.1 C)] 98.3 F (36.8 C) (02/19 0648) Pulse Rate:  [65-92] 67 (02/19 0648) Resp:  [10-23] 14 (02/19 1145) BP: (114-133)/(58-79) 133/79 mmHg (02/19 0648) SpO2:  [95 %-100 %] 97 % (02/19 1145) Weight change:  Last BM Date: 12/20/15  Intake/Output from previous day: 02/18 0701 - 02/19 0700 In: 2582 [P.O.:480; I.V.:2102] Out: -  Intake/Output this shift:    General appearance: alert, cooperative and no distress Head: Normocephalic, without obvious abnormality, atraumatic Neck: no adenopathy, no carotid bruit, no JVD, supple, symmetrical, trachea midline and thyroid not enlarged, symmetric, no tenderness/mass/nodules Back: symmetric, no curvature. ROM normal. No CVA tenderness. Resp: normal percussion bilaterally Chest wall: no tenderness Cardio: regular rate and rhythm, S1, S2 normal, no murmur, click, rub or gallop GI: soft, non-tender; bowel sounds normal; no masses,  no organomegaly Extremities: extremities normal, atraumatic, no cyanosis or edema Pulses: 2+ and symmetric Skin: Skin color, texture, turgor normal. No rashes or lesions Neurologic: Grossly normal  Lab Results:  Recent Labs  12/20/15 0415 12/21/15 0428  WBC 8.7 10.0  HGB 8.1* 8.7*  HCT 23.1* 24.8*  PLT 573* 517*   BMET  Recent Labs  12/20/15 0415 12/21/15 0428  NA 140 139  K 3.1* 3.3*  CL 105 103  CO2 28 27  GLUCOSE 112* 104*  BUN <5* <5*  CREATININE 0.36* 0.37*  CALCIUM 9.3 9.3    Studies/Results: No results found.  Medications: I have reviewed the patient's  current medications.  Assessment/Plan: A 25 YO wopman admitted with sickle cell painful crisis.  1. Sickle Cell Painful Crisis: Will continue Dilaudid PCA, Ibuprofen, hydration and pain assessment daily. Continue Physician assisted dosing for now. Given heating pad and asked to mobilize. May need to add muscle relaxants if pain persists.  2. Sickle cell anemia: H/H at baseline. Will follow closely and transfuse PRBC if indicated  3. Recent tooth infection: Per patient,. Now better. No antibiotics at this point.  4. Dehydration: will hydrate.   5. Hypokalemia: Will replete  6. Leucocytosis: resolved. Probably due SS crisis  7. Dysuria: no fever or leukocytosis. Check Ua however and if UTI suspected will treat empirically while waiting for C&S.   LOS: 2 days   Kawhi Diebold,LAWAL 12/21/2015, 11:50 AM

## 2015-12-22 ENCOUNTER — Inpatient Hospital Stay (HOSPITAL_COMMUNITY): Payer: Medicaid Other

## 2015-12-22 LAB — CBC WITH DIFFERENTIAL/PLATELET
BASOS ABS: 0 10*3/uL (ref 0.0–0.1)
Basophils Relative: 0 %
EOS PCT: 2 %
Eosinophils Absolute: 0.2 10*3/uL (ref 0.0–0.7)
HCT: 24.6 % — ABNORMAL LOW (ref 36.0–46.0)
Hemoglobin: 8.4 g/dL — ABNORMAL LOW (ref 12.0–15.0)
LYMPHS PCT: 51 %
Lymphs Abs: 4.9 10*3/uL — ABNORMAL HIGH (ref 0.7–4.0)
MCH: 28.6 pg (ref 26.0–34.0)
MCHC: 34.1 g/dL (ref 30.0–36.0)
MCV: 83.7 fL (ref 78.0–100.0)
Monocytes Absolute: 1.3 10*3/uL — ABNORMAL HIGH (ref 0.1–1.0)
Monocytes Relative: 13 %
NEUTROS ABS: 3.3 10*3/uL (ref 1.7–7.7)
Neutrophils Relative %: 34 %
PLATELETS: 555 10*3/uL — AB (ref 150–400)
RBC: 2.94 MIL/uL — AB (ref 3.87–5.11)
RDW: 17.9 % — ABNORMAL HIGH (ref 11.5–15.5)
WBC: 9.8 10*3/uL (ref 4.0–10.5)

## 2015-12-22 LAB — COMPREHENSIVE METABOLIC PANEL
ALBUMIN: 4.2 g/dL (ref 3.5–5.0)
ALK PHOS: 54 U/L (ref 38–126)
ALT: 18 U/L (ref 14–54)
AST: 26 U/L (ref 15–41)
Anion gap: 8 (ref 5–15)
BILIRUBIN TOTAL: 2.3 mg/dL — AB (ref 0.3–1.2)
CO2: 28 mmol/L (ref 22–32)
CREATININE: 0.4 mg/dL — AB (ref 0.44–1.00)
Calcium: 9.4 mg/dL (ref 8.9–10.3)
Chloride: 102 mmol/L (ref 101–111)
GFR calc Af Amer: >60 mL/min (ref >60)
GLUCOSE: 95 mg/dL (ref 65–99)
Potassium: 3.2 mmol/L — ABNORMAL LOW (ref 3.5–5.1)
Sodium: 138 mmol/L (ref 135–145)
TOTAL PROTEIN: 7.4 g/dL (ref 6.5–8.1)

## 2015-12-22 MED ORDER — LIDOCAINE HCL 1 % IJ SOLN
INTRAMUSCULAR | Status: AC
  Filled 2015-12-22: qty 20

## 2015-12-22 MED ORDER — HYDROMORPHONE HCL 4 MG PO TABS
4.0000 mg | ORAL_TABLET | ORAL | Status: DC
  Administered 2015-12-22 – 2015-12-23 (×4): 4 mg via ORAL
  Filled 2015-12-22 (×5): qty 1

## 2015-12-22 MED ORDER — HYDROMORPHONE HCL 2 MG/ML IJ SOLN
2.0000 mg | INTRAMUSCULAR | Status: DC
  Administered 2015-12-22 – 2015-12-23 (×3): 2 mg via SUBCUTANEOUS
  Filled 2015-12-22 (×3): qty 1

## 2015-12-22 MED ORDER — HYDROMORPHONE HCL 2 MG/ML IJ SOLN: 2.0000 mg | INTRAMUSCULAR | Status: DC

## 2015-12-22 NOTE — Progress Notes (Signed)
SICKLE CELL SERVICE PROGRESS NOTE  Carmen [name] [id number] DOB: 1991-03-29 DOA: 12/19/2015 PCP: [name], MD  Assessment/Plan: Principal Problem:   Sickle cell disease with crisis ([place]) Active Problems:   Anemia   Sickle cell anemia with crisis ([place])  1. Hb SS with crisis: Patient reports pain in low back and legs at an intensity of 9 out of 10. The patient lost IV access and thus has been unable to receive any IV pain medication. Temporarily I will change her clinician assisted doses to the subcutaneous route and schedule her oral Dilaudid 4 mg. Once IV access is reestablished we will resume the Dilaudid PCA and Toradol. 2. Hypokalemia: Patient has a mild hypokalemia will replace with oral potassium. 3. Anemia of chronic disease: Hemoglobin currently mildly above baseline. We'll continue to monitor. 4. Sickle cell disease: Patient is currently not on hydroxyurea folic acid. This likely contributes to the frequency of hospitalizations with this patient. 5. Poor peripheral IV access: Will order PICC by fluoroscopy.  Code Status: Full Code Family Communication: N/A Disposition Plan: Not yet ready for discharge  [place].  Pager [phone]. If 7PM-7AM, please contact night-coverage.  12/22/2015, 4:19 PM  LOS: 3 days   Interim History: Patient reports that her last bowel movement was last night. She rates her pain as 9/10 localized to the low back and legs. She's lost IV access and this cannot receive IV medications at this time. Patient is encouraged to increase oral intake of fluid.  Consultants:  None  Procedures:  None  Antibiotics:  None   Objective: Filed Vitals:   12/22/15 0352 12/22/15 0632 12/22/15 0850 12/22/15 1423  BP:  100/68  125/79  Pulse:  74  73  Temp:  97.8 F (36.6 C)  98.9 F (37.2 C)  TempSrc:  Oral  Oral  Resp: 11 15 15 16   Height:      Weight:      SpO2: 100% 100% 99% 100%   Weight change:   Intake/Output Summary (Last  24 hours) at 12/22/15 1619 Last data filed at 12/22/15 0830  Gross per 24 hour  Intake 3113.92 ml  Output      0 ml  Net 3113.92 ml    General: Alert, awake, oriented x3, in no acute distress.  HEENT: [place]/AT PEERL, EOMI, anicteric Neck: Trachea midline,  no masses, no thyromegal,y no JVD, no carotid bruit OROPHARYNX:  Moist, No exudate/ erythema/lesions.  Heart: Regular rate and rhythm, without murmurs, rubs, gallops, PMI non-displaced, no heaves or thrills on palpation.  Lungs: Clear to auscultation, no wheezing or rhonchi noted. No increased vocal fremitus resonant to percussion  Abdomen: Soft, nontender, nondistended, positive bowel sounds, no masses no hepatosplenomegaly noted..  Neuro: No focal neurological deficits noted cranial nerves II through XII grossly intact.  Strength at functional baseline in bilateral upper and lower extremities. Musculoskeletal: No warm swelling or erythema around joints, no spinal tenderness noted. Psychiatric: Patient alert and oriented x3, good insight and cognition, good recent to remote recall.    Data Reviewed: Basic Metabolic Panel:  Recent Labs Lab 12/19/15 0345 12/19/15 0818 12/20/15 0415 12/21/15 0428 12/22/15 0412  NA 141 141 140 139 138  K 3.4* 3.3* 3.1* 3.3* 3.2*  CL 108 108 105 103 102  CO2 23 25 28 27 28   GLUCOSE 86 93 112* 104* 95  BUN <5* <5* <5* <5* <5*  CREATININE 0.34* 0.40* 0.36* 0.37* 0.40*  CALCIUM 9.4 8.9 9.3 9.3 9.4  MG  --  1.9  --   --   --  Liver Function Tests:  Recent Labs Lab 12/19/15 0345 12/19/15 0818 12/20/15 0415 12/21/15 0428 12/22/15 0412  AST 27 25 28 26 26   ALT 17 17 18 18 18   ALKPHOS 62 57 55 54 54  BILITOT 1.5* 2.0* 1.8* 1.4* 2.3*  PROT 8.1 7.6 7.3 7.3 7.4  ALBUMIN 4.6 4.2 3.9 4.1 4.2   No results for input(s): LIPASE, AMYLASE in the last 168 hours. No results for input(s): AMMONIA in the last 168 hours. CBC:  Recent Labs Lab 12/19/15 0345 12/19/15 0818 12/20/15 0415  12/21/15 0428 12/22/15 0412  WBC 11.8* 13.5* 8.7 10.0 9.8  NEUTROABS 7.8* 6.5 3.2 2.6 3.3  HGB 9.3* 8.3* 8.1* 8.7* 8.4*  HCT 26.3* 23.8* 23.1* 24.8* 24.6*  MCV 84.0 84.1 83.7 84.1 83.7  PLT 537* 579* 573* 517* 555*   Cardiac Enzymes: No results for input(s): CKTOTAL, CKMB, CKMBINDEX, TROPONINI in the last 168 hours. BNP (last 3 results) No results for input(s): BNP in the last 8760 hours.  ProBNP (last 3 results) No results for input(s): PROBNP in the last 8760 hours.  CBG: No results for input(s): GLUCAP in the last 168 hours.  Recent Results (from the past 240 hour(s))  Culture, Urine     Status: None (Preliminary result)   Collection Time: 12/21/15 12:35 PM  Result Value Ref Range Status   Specimen Description URINE, CLEAN CATCH  Final   Special Requests NONE  Final   Culture   Final    TOO YOUNG TO READ Performed at [facility]    Report Status PENDING  Incomplete     Studies: Dg Chest 2 View  12/06/2015  CLINICAL DATA:  Sickle cell. Chest pain since yesterday. Shortness of breath and cough. EXAM: CHEST  2 VIEW COMPARISON:  11/14/2015 FINDINGS: The cardiomediastinal silhouette is within normal limits. The lungs are well inflated with minimal chronic scarring in the right lung base and unchanged, slight peribronchial thickening. There is no evidence of confluent opacity, edema, pleural effusion, or pneumothorax. Right upper quadrant abdominal surgical clips are noted. No acute osseous abnormality is identified. IMPRESSION: No active cardiopulmonary disease. Electronically Signed   By: Logan Bores M.D.   On: 12/06/2015 07:40    Scheduled Meds: . enoxaparin (LOVENOX) injection  40 mg Subcutaneous Q24H  . folic acid  1 mg Oral Daily  . HYDROmorphone   Intravenous 6 times per day  .  HYDROmorphone (DILAUDID) injection  2 mg Subcutaneous Q2H  . HYDROmorphone  4 mg Oral Q4H  . senna-docusate  1 tablet Oral BID   Continuous Infusions: . sodium chloride Stopped  (12/19/15 1405)  . dextrose 5 % and 0.45% NaCl 125 mL/hr at 12/22/15 0646    Time spent 25 minutes.

## 2015-12-22 NOTE — Procedures (Signed)
Successful placement of right brachial vein approach 35 cm dual lumen PICC line with tip at the superior caval-atrial junction.   The PICC line is ready for immediate use.  Ronny Bacon, MD Pager #: [phone]

## 2015-12-22 NOTE — Progress Notes (Signed)
MEDICATION RELATED CONSULT NOTE   IR Procedure Consult - Anticoagulant/Antiplatelet PTA/Inpatient Med List Review by Pharmacist   Procedure:   PICC line Completed: 2/20 Post-Procedural bleeding risk per IR MD assessment:  Low  Antithrombotic medications on inpatient or PTA profile prior to procedure: Lovenox 40mg  q24h (LD 2/20 @ 1426 - remains active order)   Recommended restart time per IR Post-Procedure Guidelines:    Plan: Lovenox never stopped prior to procedure. Continue as ordered, next dose due 2/21.   Ralene Bathe, PharmD, BCPS 12/22/2015, 7:25 PM  Pager: [phone]

## 2015-12-23 DIAGNOSIS — W19XXXA Unspecified fall, initial encounter: Secondary | ICD-10-CM

## 2015-12-23 DIAGNOSIS — Y92239 Unspecified place in hospital as the place of occurrence of the external cause: Secondary | ICD-10-CM

## 2015-12-23 DIAGNOSIS — D519 Vitamin B12 deficiency anemia, unspecified: Secondary | ICD-10-CM

## 2015-12-23 MED ORDER — HYDROMORPHONE HCL 2 MG/ML IJ SOLN
2.0000 mg | INTRAMUSCULAR | Status: DC | PRN
  Administered 2015-12-23 – 2015-12-25 (×12): 2 mg via INTRAVENOUS
  Filled 2015-12-23 (×12): qty 1

## 2015-12-23 NOTE — Progress Notes (Signed)
Pt found sitting on floor in bathroom post fall. Denies any injuries. Does have small superficial abrasion to left anterior shin. Neurologically intact, alert and oriented. See VS flowsheet.  Dr Stann Mainland paged to inform. Fall occurred at approximately 1450. Upon physical assessment no visible bruises,lacerations or other injuries

## 2015-12-23 NOTE — Progress Notes (Signed)
Pt ambulated in hallway aprox. 100 ft without significant difficulty. Did C/O pain in legs and soon returned to room, instructed to press PCA/pain medication as needed.

## 2015-12-23 NOTE — Progress Notes (Signed)
SICKLE CELL SERVICE PROGRESS NOTE  Carmen [name] LNL:[id number] DOB: 09-11-1991 DOA: 12/19/2015 PCP: [name], MD  Assessment/Plan: Principal Problem:   Sickle cell disease with crisis ([place]) Active Problems:   Anemia   Sickle cell anemia with crisis ([place])  1. Hb SS with crisis: Patient reports pain in low back and legs at an intensity of 9/10 after having fallen. IV access was restored at about 8:00pm last night and PCA resumed. Given the lapse in treatment due to loss of IV access will continue  Dilaudid PCA and Toradol at current dose. Will also ad clinician assisited doses on a PRN basis. 2. S/P Fall:  No significant injury except for superficial break in skin on the left shin area. Will apply bacitracin ointment topically and observe for any signs of infection. 3. Hypokalemia: Patient has a mild hypokalemia will replace with oral potassium. 4. Anemia of chronic disease: Hemoglobin currently mildly above baseline. We'll continue to monitor. 5. Sickle cell disease: Patient is currently not on hydroxyurea folic acid. This likely contributes to the frequency of hospitalizations with this patient. 6. Poor peripheral IV access:  PICC line successfully placed by fluoroscopy.  Code Status: Full Code Family Communication: N/A Disposition Plan: Not yet ready for discharge  [place].  Pager [phone]. If 7PM-7AM, please contact night-coverage.  12/23/2015, 4:19 PM  LOS: 4 days   Interim History: Patient reports that her last bowel movement was last night. She rates her pain as 9/10 localized to the low back and legs. She's lost IV access and this cannot receive IV medications at this time. Patient is encouraged to increase oral intake of fluid. Pt had a fall in the bathroom today. She reports that she was sitting on the toilet for an extended amount of time and her legs "went to sleep" thus when she stood up they "gave away".    Consultants:  None  Procedures:  None  Antibiotics:  None   Objective: Filed Vitals:   12/23/15 0810 12/23/15 0950 12/23/15 1112 12/23/15 1455  BP:  114/64  126/84  Pulse:  100  104  Temp:  98.3 F (36.8 C)  98.4 F (36.9 C)  TempSrc:  Oral    Resp: _0 Height:      Weight:      SpO2: 100% 100% 98% 97%   Weight change:  No intake or output data in the 24 hours ending 12/23/15 1619  General: Alert, awake, oriented x3, in no acute distress.  HEENT: [place]/AT PEERL, EOMI, anicteric Neck: Trachea midline,  no masses, no thyromegal,y no JVD, no carotid bruit OROPHARYNX:  Moist, No exudate/ erythema/lesions.  Heart: Regular rate and rhythm, without murmurs, rubs, gallops, PMI non-displaced, no heaves or thrills on palpation.  Lungs: Clear to auscultation, no wheezing or rhonchi noted. No increased vocal fremitus resonant to percussion  Abdomen: Soft, nontender, nondistended, positive bowel sounds, no masses no hepatosplenomegaly noted..  Neuro: No focal neurological deficits noted cranial nerves II through XII grossly intact.  Strength at functional baseline in bilateral upper and lower extremities. Musculoskeletal: No warm swelling or erythema around joints, no spinal tenderness noted. Psychiatric: Patient alert and oriented x3, good insight and cognition, good recent to remote recall. Skin: Area of shin breakage noted on the left shin. No bleeding, swelling, erythema or warmth noted.    Data Reviewed: Basic Metabolic Panel:  Recent Labs Lab 12/19/15 0345 12/19/15 0818 12/20/15 0415 12/21/15 0428 12/22/15 0412  NA 141 141 140 139 138  K 3.4* 3.3* 3.1* 3.3* 3.2*  CL 108 108 105 103 102  CO2 _0 GLUCOSE 86 93 112* 104* 95  BUN <5* <5* <5* <5* <5*  CREATININE 0.34* 0.40* 0.36* 0.37* 0.40*  CALCIUM 9.4 8.9 9.3 9.3 9.4  MG  --  1.9  --   --   --    Liver Function Tests:  Recent Labs Lab 12/19/15 0345 12/19/15 0818 12/20/15 0415  12/21/15 0428 12/22/15 0412  AST _1 ALT _2 ALKPHOS 62 57 55 54 54  BILITOT 1.5* 2.0* 1.8* 1.4* 2.3*  PROT 8.1 7.6 7.3 7.3 7.4  ALBUMIN 4.6 4.2 3.9 4.1 4.2   No results for input(s): LIPASE, AMYLASE in the last 168 hours. No results for input(s): AMMONIA in the last 168 hours. CBC:  Recent Labs Lab 12/19/15 0345 12/19/15 0818 12/20/15 0415 12/21/15 0428 12/22/15 0412  WBC 11.8* 13.5* 8.7 10.0 9.8  NEUTROABS 7.8* 6.5 3.2 2.6 3.3  HGB 9.3* 8.3* 8.1* 8.7* 8.4*  HCT 26.3* 23.8* 23.1* 24.8* 24.6*  MCV 84.0 84.1 83.7 84.1 83.7  PLT 537* 579* 573* 517* 555*   Cardiac Enzymes: No results for input(s): CKTOTAL, CKMB, CKMBINDEX, TROPONINI in the last 168 hours. BNP (last 3 results) No results for input(s): BNP in the last 8760 hours.  ProBNP (last 3 results) No results for input(s): PROBNP in the last 8760 hours.  CBG: No results for input(s): GLUCAP in the last 168 hours.  Recent Results (from the past 240 hour(s))  Culture, Urine     Status: None (Preliminary result)   Collection Time: 12/21/15 12:35 PM  Result Value Ref Range Status   Specimen Description URINE, CLEAN CATCH  Final   Special Requests NONE  Final   Culture   Final    CULTURE REINCUBATED FOR BETTER GROWTH Performed at [facility]    Report Status PENDING  Incomplete     Studies: Dg Chest 2 View  12/06/2015  CLINICAL DATA:  Sickle cell. Chest pain since yesterday. Shortness of breath and cough. EXAM: CHEST  2 VIEW COMPARISON:  11/14/2015 FINDINGS: The cardiomediastinal silhouette is within normal limits. The lungs are well inflated with minimal chronic scarring in the right lung base and unchanged, slight peribronchial thickening. There is no evidence of confluent opacity, edema, pleural effusion, or pneumothorax. Right upper quadrant abdominal surgical clips are noted. No acute osseous abnormality is identified. IMPRESSION: No active cardiopulmonary disease. Electronically  Signed   By: Logan Bores M.D.   On: 12/06/2015 07:40   Ir Fluoro Guide Cv Line Right  12/22/2015  INDICATION: Sickle cell crisis. In need of durable intravenous access for medication administration and blood draws for EXAM: ULTRASOUND AND FLUOROSCOPIC GUIDED PICC LINE INSERTION MEDICATIONS: None. CONTRAST:  None FLUOROSCOPY TIME:  12 seconds (2 mGy) COMPLICATIONS: None immediate. TECHNIQUE: The procedure, risks, benefits, and alternatives were explained to the patient and informed written consent was obtained. A timeout was performed prior to the initiation of the procedure. The right upper extremity was prepped with chlorhexidine in a sterile fashion, and a sterile drape was applied covering the operative field. Maximum barrier sterile technique with sterile gowns and gloves were used for the procedure. A timeout was performed prior to the initiation of the procedure. Local anesthesia was provided with 1% lidocaine. Under direct ultrasound guidance, the right brachial vein was accessed with a micropuncture kit after the overlying soft tissues were anesthetized  with 1% lidocaine. An ultrasound image was saved for documentation purposes. A guidewire was advanced to the level of the superior caval-atrial junction for measurement purposes and the PICC line was cut to length. A peel-away sheath was placed and a 36 cm, 5 [place], dual lumen was inserted to level of the superior caval-atrial junction. A post procedure spot fluoroscopic was obtained. The catheter easily aspirated and flushed and was sutured in place. A dressing was placed. The patient tolerated the procedure well without immediate post procedural complication. FINDINGS: After catheter placement, the tip lies within the superior cavoatrial junction. The catheter aspirates and flushes normally and is ready for immediate use. IMPRESSION: Successful ultrasound and fluoroscopic guided placement of a right brachial vein approach, 36 cm, 5 French, dual lumen  PICC with tip at the superior caval-atrial junction. The PICC line is ready for immediate use. Electronically Signed   By: Sandi Mariscal M.D.   On: 12/22/2015 17:32   Ir US Guide Vasc Access Right  12/22/2015  INDICATION: Sickle cell crisis. In need of durable intravenous access for medication administration and blood draws for EXAM: ULTRASOUND AND FLUOROSCOPIC GUIDED PICC LINE INSERTION MEDICATIONS: None. CONTRAST:  None FLUOROSCOPY TIME:  12 seconds (2 mGy) COMPLICATIONS: None immediate. TECHNIQUE: The procedure, risks, benefits, and alternatives were explained to the patient and informed written consent was obtained. A timeout was performed prior to the initiation of the procedure. The right upper extremity was prepped with chlorhexidine in a sterile fashion, and a sterile drape was applied covering the operative field. Maximum barrier sterile technique with sterile gowns and gloves were used for the procedure. A timeout was performed prior to the initiation of the procedure. Local anesthesia was provided with 1% lidocaine. Under direct ultrasound guidance, the right brachial vein was accessed with a micropuncture kit after the overlying soft tissues were anesthetized with 1% lidocaine. An ultrasound image was saved for documentation purposes. A guidewire was advanced to the level of the superior caval-atrial junction for measurement purposes and the PICC line was cut to length. A peel-away sheath was placed and a 36 cm, 5 [place], dual lumen was inserted to level of the superior caval-atrial junction. A post procedure spot fluoroscopic was obtained. The catheter easily aspirated and flushed and was sutured in place. A dressing was placed. The patient tolerated the procedure well without immediate post procedural complication. FINDINGS: After catheter placement, the tip lies within the superior cavoatrial junction. The catheter aspirates and flushes normally and is ready for immediate use. IMPRESSION: Successful  ultrasound and fluoroscopic guided placement of a right brachial vein approach, 36 cm, 5 French, dual lumen PICC with tip at the superior caval-atrial junction. The PICC line is ready for immediate use. Electronically Signed   By: Sandi Mariscal M.D.   On: 12/22/2015 17:32    Scheduled Meds: . enoxaparin (LOVENOX) injection  40 mg Subcutaneous Q24H  . folic acid  1 mg Oral Daily  . HYDROmorphone   Intravenous 6 times per day  . senna-docusate  1 tablet Oral BID   Continuous Infusions: . sodium chloride Stopped (12/19/15 1405)  . dextrose 5 % and 0.45% NaCl 125 mL/hr at 12/23/15 1003    Time spent 30 minutes.

## 2015-12-23 NOTE — Plan of Care (Signed)
Problem: Phase I Progression Outcomes Goal: Pulmonary Hygiene as Indicated (Sickle Cell) Outcome: Progressing I/S at bedside

## 2015-12-24 DIAGNOSIS — E876 Hypokalemia: Secondary | ICD-10-CM

## 2015-12-24 LAB — MAGNESIUM: MAGNESIUM: 1.7 mg/dL (ref 1.7–2.4)

## 2015-12-24 LAB — CBC WITH DIFFERENTIAL/PLATELET
Basophils Absolute: 0 10*3/uL (ref 0.0–0.1)
Basophils Relative: 0 %
Eosinophils Absolute: 0.4 10*3/uL (ref 0.0–0.7)
Eosinophils Relative: 3 %
HEMATOCRIT: 21.9 % — AB (ref 36.0–46.0)
HEMOGLOBIN: 7.6 g/dL — AB (ref 12.0–15.0)
LYMPHS ABS: 4.9 10*3/uL — AB (ref 0.7–4.0)
LYMPHS PCT: 40 %
MCH: 29.3 pg (ref 26.0–34.0)
MCHC: 34.7 g/dL (ref 30.0–36.0)
MCV: 84.6 fL (ref 78.0–100.0)
Monocytes Absolute: 1.4 10*3/uL — ABNORMAL HIGH (ref 0.1–1.0)
Monocytes Relative: 12 %
NEUTROS PCT: 45 %
Neutro Abs: 5.5 10*3/uL (ref 1.7–7.7)
Platelets: 553 10*3/uL — ABNORMAL HIGH (ref 150–400)
RBC: 2.59 MIL/uL — AB (ref 3.87–5.11)
RDW: 18 % — ABNORMAL HIGH (ref 11.5–15.5)
WBC: 12.2 10*3/uL — AB (ref 4.0–10.5)

## 2015-12-24 LAB — BASIC METABOLIC PANEL
Anion gap: 9 (ref 5–15)
CHLORIDE: 103 mmol/L (ref 101–111)
CO2: 28 mmol/L (ref 22–32)
Calcium: 9.4 mg/dL (ref 8.9–10.3)
Creatinine, Ser: 0.32 mg/dL — ABNORMAL LOW (ref 0.44–1.00)
GFR calc Af Amer: >60 mL/min (ref >60)
GFR calc non Af Amer: >60 mL/min (ref >60)
GLUCOSE: 98 mg/dL (ref 65–99)
POTASSIUM: 3.2 mmol/L — AB (ref 3.5–5.1)
Sodium: 140 mmol/L (ref 135–145)

## 2015-12-24 LAB — URINE CULTURE: Culture: >=100000

## 2015-12-24 MED ORDER — MAGNESIUM CITRATE PO SOLN
1.0000 | Freq: Once | ORAL | Status: AC
  Administered 2015-12-24: 1 via ORAL
  Filled 2015-12-24: qty 296

## 2015-12-24 MED ORDER — MAGNESIUM SULFATE 2 GM/50ML IV SOLN
2.0000 g | Freq: Once | INTRAVENOUS | Status: AC
  Administered 2015-12-24: 2 g via INTRAVENOUS
  Filled 2015-12-24: qty 50

## 2015-12-24 MED ORDER — ZOLPIDEM TARTRATE 5 MG PO TABS
5.0000 mg | ORAL_TABLET | Freq: Once | ORAL | Status: AC
  Administered 2015-12-24: 5 mg via ORAL
  Filled 2015-12-24: qty 1

## 2015-12-24 MED ORDER — ENSURE ENLIVE PO LIQD
237.0000 mL | Freq: Two times a day (BID) | ORAL | Status: DC
  Administered 2015-12-24 – 2015-12-25 (×3): 237 mL via ORAL

## 2015-12-24 MED ORDER — POTASSIUM CHLORIDE CRYS ER 20 MEQ PO TBCR
40.0000 meq | EXTENDED_RELEASE_TABLET | ORAL | Status: AC
  Administered 2015-12-24 (×3): 40 meq via ORAL
  Filled 2015-12-24 (×3): qty 2

## 2015-12-24 NOTE — Progress Notes (Signed)
Initial Nutrition Assessment  DOCUMENTATION CODES:   Obesity unspecified  INTERVENTION:   -Provide Ensure Enlive po BID, each supplement provides 350 kcal and 20 grams of protein -Provide daily snacks -Per pt request, pt would like thickened juice with meals -RD to continue to monitor  NUTRITION DIAGNOSIS:   Inadequate oral intake related to mouth pain, other (see comment) (sickle cell pain) as evidenced by per patient/family report.  GOAL:   Patient will meet greater than or equal to 90% of their needs  MONITOR:   PO intake, Supplement acceptance, Labs, Weight trends, I & O's  REASON FOR ASSESSMENT:   Consult Assessment of nutrition requirement/status  ASSESSMENT:   25 yo with history of sickle cell disease presenting to the ER with 3 day hoistory of pain in her limbs, back and legs. She had tooth abscess which triggered her attack. She took more of her home pain medications and run out 4 days ago. She said she called her physician to complain but did not receive extra pain medications. She now has 10/10 pain which is not relieved by the Dilaudid injections she received in the ER.  Per MD consult, pt requesting Ensure supplements. Pt in room with no family present. Pt with reported high pain level and reports poor appetite as a result. Per documentation, pt was eating 100% of meals 2/19 but intake has decreased since. Pt with multiple cans of soda at bedside. Pt states she drinks 2-3 Ensure supplements a day at home. RD to order BID. She denies any swallowing issues but states that it is difficult for her to chew on one side d/t a wisdom tooth. Denies any N/V. Pt also requests thickened juices with trays, states she likes the taste. Also a daily snack, RD will order.  Per weight history, pt has lost 9 lb since 2/04 (5% wt loss x 2.5 weeks, significant for time frame). Pt's weight has fluctuated during this admission, question if weight loss is fluid related. Nutrition focused  physical exam shows no sign of depletion of muscle mass or body fat.  Medications: D5 -0.45% NaCl infusion @ 125 ml/hr ->provides 510 kcal  Labs reviewed: Low K, BUN, Mg WNL  Diet Order:  Diet regular Room service appropriate?: Yes; Fluid consistency:: Thin  Skin:  Reviewed, no issues  Last BM:  PTA  Height:   Ht Readings from Last 1 Encounters:  12/19/15 5\' 3"  (1.6 m)    Weight:   Wt Readings from Last 1 Encounters:  12/24/15 176 lb 1.6 oz (79.878 kg)    Ideal Body Weight:  52.3 kg  BMI:  Body mass index is 31.2 kg/(m^2).  Estimated Nutritional Needs:   Kcal:  1600-1800  Protein:  65-75g  Fluid:  1.8L/day  EDUCATION NEEDS:   No education needs identified at this time  Clayton Bibles, MS, RD, LDN Pager: [phone] After Hours Pager: [phone]

## 2015-12-24 NOTE — Progress Notes (Signed)
SICKLE CELL SERVICE PROGRESS NOTE  ABIMBOLA AKI ZHY:[id number] DOB: [birth year] DOA: 12/19/2015 PCP: [name], MD  Assessment/Plan: Principal Problem:   Sickle cell disease with crisis ([place]) Active Problems:   Anemia   Sickle cell anemia with crisis ([place])  1. Hb SS with crisis: Patient reports pain has improved. She feels that pain is controlled enough to transition to oral analgesics with PCA as PRN. Will transition to Oxycodone and continue PCA for PRN use. Discontinue clinician assisted doses.  2. Hypokalemia:  Patient has a mild hypokalemia will replace with oral potassium. 3. Hypomagnesemia: Will replace by IV. 4. S/P Fall:  No significant injury except for superficial break in skin on the left shin area. Will apply bacitracin ointment topically and observe for any signs of infection. 5. Anemia of chronic disease: Hemoglobin currently at baseline. We'll continue to monitor. 6. Sickle cell disease: Patient is currently not on hydroxyurea or folic acid. This likely contributes to the frequency of hospitalizations with this patient. 7. Poor peripheral IV access:  PICC line successfully placed by fluoroscopy.  Code Status: Full Code Family Communication: N/A Disposition Plan: Anticipate discharge home in 24-48 hours.  Teanna Elem A.  Pager [phone]. If 7PM-7AM, please contact night-coverage.  12/24/2015, 8:50 AM  LOS: 5 days   Interim History: Patient reports that her last bowel movement was this morning but consistency of stool is hard and constipated appearing. She reports that pain has improved and is localized to the low back and legs. Pt has used 43 mg with 83/82:demands/deliveries in the last 24 hours. She has used only 15 mg in the last 12 hours.  Pt reports that she feels SOB however she is observed to speak in full sentences without  dyspnea, she also has no increased work of breathing and oxygen saturations are normal.    Consultants:  None  Procedures:  None  Antibiotics:  None   Objective: Filed Vitals:   12/23/15 2019 12/24/15 0011 12/24/15 0400 12/24/15 0738  BP: 127/74  111/65   Pulse: 104  93   Temp: 98.3 F (36.8 C)  98.5 F (36.9 C)   TempSrc: Oral  Oral   Resp: '15 14 18 13  ' Height:      Weight:   176 lb 1.6 oz (79.878 kg)   SpO2: 100% 97% 100% 96%   Weight change: -4 lb 14.4 oz (-2.223 kg)  Intake/Output Summary (Last 24 hours) at 12/24/15 0850 Last data filed at 12/24/15 0600  Gross per 24 hour  Intake 4383.75 ml  Output      0 ml  Net 4383.75 ml    General: Alert, awake, oriented x3, in no acute distress.  HEENT: [place]/AT PEERL, EOMI, anicteric Neck: Trachea midline,  no masses, no thyromegal,y no JVD, no carotid bruit OROPHARYNX:  Moist, No exudate/ erythema/lesions.  Heart: Regular rate and rhythm, without murmurs, rubs, gallops, PMI non-displaced, no heaves or thrills on palpation.  Lungs: Clear to auscultation, no wheezing or rhonchi noted. No increased vocal fremitus resonant to percussion  Abdomen: Soft, nontender, nondistended, positive bowel sounds, no masses no hepatosplenomegaly noted..  Neuro: No focal neurological deficits noted cranial nerves II through XII grossly intact.  Strength at functional baseline in bilateral upper and lower extremities. Musculoskeletal: No warm swelling or erythema around joints, no spinal tenderness noted. Psychiatric: Patient alert and oriented x3, good insight and cognition, good recent to remote recall. Skin: Area of shin breakage noted on the left shin. No bleeding, swelling, erythema or warmth noted.  Data Reviewed: Basic Metabolic Panel:  Recent Labs Lab 12/19/15 0818 12/20/15 0415 12/21/15 0428 12/22/15 0412 12/24/15 0520  NA 141 140 139 138 140  K 3.3* 3.1* 3.3* 3.2* 3.2*  CL 108 105 103 102 103  CO2 '25 28 27 28 28  ' GLUCOSE 93 112* 104* 95 98  BUN <5* <5* <5* <5* <5*  CREATININE 0.40* 0.36* 0.37* 0.40*  0.32*  CALCIUM 8.9 9.3 9.3 9.4 9.4  MG 1.9  --   --   --   --    Liver Function Tests:  Recent Labs Lab 12/19/15 0345 12/19/15 0818 12/20/15 0415 12/21/15 0428 12/22/15 0412  AST '27 25 28 26 26  ' ALT '17 17 18 18 18  ' ALKPHOS 62 57 55 54 54  BILITOT 1.5* 2.0* 1.8* 1.4* 2.3*  PROT 8.1 7.6 7.3 7.3 7.4  ALBUMIN 4.6 4.2 3.9 4.1 4.2   No results for input(s): LIPASE, AMYLASE in the last 168 hours. No results for input(s): AMMONIA in the last 168 hours. CBC:  Recent Labs Lab 12/19/15 0818 12/20/15 0415 12/21/15 0428 12/22/15 0412 12/24/15 0520  WBC 13.5* 8.7 10.0 9.8 12.2*  NEUTROABS 6.5 3.2 2.6 3.3 5.5  HGB 8.3* 8.1* 8.7* 8.4* 7.6*  HCT 23.8* 23.1* 24.8* 24.6* 21.9*  MCV 84.1 83.7 84.1 83.7 84.6  PLT 579* 573* 517* 555* 553*   Cardiac Enzymes: No results for input(s): CKTOTAL, CKMB, CKMBINDEX, TROPONINI in the last 168 hours. BNP (last 3 results) No results for input(s): BNP in the last 8760 hours.  ProBNP (last 3 results) No results for input(s): PROBNP in the last 8760 hours.  CBG: No results for input(s): GLUCAP in the last 168 hours.  Recent Results (from the past 240 hour(s))  Culture, Urine     Status: None (Preliminary result)   Collection Time: 12/21/15 12:35 PM  Result Value Ref Range Status   Specimen Description URINE, CLEAN CATCH  Final   Special Requests NONE  Final   Culture   Final    CULTURE REINCUBATED FOR BETTER GROWTH Performed at [facility]    Report Status PENDING  Incomplete     Studies: Dg Chest 2 View  12/06/2015  CLINICAL DATA:  Sickle cell. Chest pain since yesterday. Shortness of breath and cough. EXAM: CHEST  2 VIEW COMPARISON:  11/14/2015 FINDINGS: The cardiomediastinal silhouette is within normal limits. The lungs are well inflated with minimal chronic scarring in the right lung base and unchanged, slight peribronchial thickening. There is no evidence of confluent opacity, edema, pleural effusion, or pneumothorax. Right  upper quadrant abdominal surgical clips are noted. No acute osseous abnormality is identified. IMPRESSION: No active cardiopulmonary disease. Electronically Signed   By: Logan Bores M.D.   On: 12/06/2015 07:40   Ir Fluoro Guide Cv Line Right  12/22/2015  INDICATION: Sickle cell crisis. In need of durable intravenous access for medication administration and blood draws for EXAM: ULTRASOUND AND FLUOROSCOPIC GUIDED PICC LINE INSERTION MEDICATIONS: None. CONTRAST:  None FLUOROSCOPY TIME:  12 seconds (2 mGy) COMPLICATIONS: None immediate. TECHNIQUE: The procedure, risks, benefits, and alternatives were explained to the patient and informed written consent was obtained. A timeout was performed prior to the initiation of the procedure. The right upper extremity was prepped with chlorhexidine in a sterile fashion, and a sterile drape was applied covering the operative field. Maximum barrier sterile technique with sterile gowns and gloves were used for the procedure. A timeout was performed prior to the initiation of the procedure. Local  anesthesia was provided with 1% lidocaine. Under direct ultrasound guidance, the right brachial vein was accessed with a micropuncture kit after the overlying soft tissues were anesthetized with 1% lidocaine. An ultrasound image was saved for documentation purposes. A guidewire was advanced to the level of the superior caval-atrial junction for measurement purposes and the PICC line was cut to length. A peel-away sheath was placed and a 36 cm, 5 [place], dual lumen was inserted to level of the superior caval-atrial junction. A post procedure spot fluoroscopic was obtained. The catheter easily aspirated and flushed and was sutured in place. A dressing was placed. The patient tolerated the procedure well without immediate post procedural complication. FINDINGS: After catheter placement, the tip lies within the superior cavoatrial junction. The catheter aspirates and flushes normally and is  ready for immediate use. IMPRESSION: Successful ultrasound and fluoroscopic guided placement of a right brachial vein approach, 36 cm, 5 [place], dual lumen PICC with tip at the superior caval-atrial junction. The PICC line is ready for immediate use. Electronically Signed   By: Sandi Mariscal M.D.   On: 12/22/2015 17:32   Ir US Guide Vasc Access Right  12/22/2015  INDICATION: Sickle cell crisis. In need of durable intravenous access for medication administration and blood draws for EXAM: ULTRASOUND AND FLUOROSCOPIC GUIDED PICC LINE INSERTION MEDICATIONS: None. CONTRAST:  None FLUOROSCOPY TIME:  12 seconds (2 mGy) COMPLICATIONS: None immediate. TECHNIQUE: The procedure, risks, benefits, and alternatives were explained to the patient and informed written consent was obtained. A timeout was performed prior to the initiation of the procedure. The right upper extremity was prepped with chlorhexidine in a sterile fashion, and a sterile drape was applied covering the operative field. Maximum barrier sterile technique with sterile gowns and gloves were used for the procedure. A timeout was performed prior to the initiation of the procedure. Local anesthesia was provided with 1% lidocaine. Under direct ultrasound guidance, the right brachial vein was accessed with a micropuncture kit after the overlying soft tissues were anesthetized with 1% lidocaine. An ultrasound image was saved for documentation purposes. A guidewire was advanced to the level of the superior caval-atrial junction for measurement purposes and the PICC line was cut to length. A peel-away sheath was placed and a 36 cm, 5 [place], dual lumen was inserted to level of the superior caval-atrial junction. A post procedure spot fluoroscopic was obtained. The catheter easily aspirated and flushed and was sutured in place. A dressing was placed. The patient tolerated the procedure well without immediate post procedural complication. FINDINGS: After catheter  placement, the tip lies within the superior cavoatrial junction. The catheter aspirates and flushes normally and is ready for immediate use. IMPRESSION: Successful ultrasound and fluoroscopic guided placement of a right brachial vein approach, 36 cm, 5 [place], dual lumen PICC with tip at the superior caval-atrial junction. The PICC line is ready for immediate use. Electronically Signed   By: Sandi Mariscal M.D.   On: 12/22/2015 17:32    Scheduled Meds: . enoxaparin (LOVENOX) injection  40 mg Subcutaneous Q24H  . folic acid  1 mg Oral Daily  . HYDROmorphone   Intravenous 6 times per day  . potassium chloride  40 mEq Oral Q2H  . senna-docusate  1 tablet Oral BID   Continuous Infusions: . sodium chloride Stopped (12/19/15 1405)  . dextrose 5 % and 0.45% NaCl 125 mL/hr at 12/24/15 0116    Time spent 25 minutes.

## 2015-12-24 NOTE — Care Management Note (Signed)
Case Management Note  Patient Details  Name: Carmen [name] MRN: [id number] Date of Birth: [birth year]  Subjective/Objective:      25 yo admitted with [facility]              Action/Plan: From home with mother  Expected Discharge Date:   (unknown)               Expected Discharge Plan:  Home/Self Care  In-House Referral:     Discharge planning Services  CM Consult  Post Acute Care Choice:    Choice offered to:     DME Arranged:    DME Agency:     HH Arranged:    HH Agency:     Status of Service:  In process, will continue to follow  Medicare Important Message Given:    Date Medicare IM Given:    Medicare IM give by:    Date Additional Medicare IM Given:    Additional Medicare Important Message give by:     If discussed at [place] of Stay Meetings, dates discussed:    Additional Comments: Chart reviewed and no CM needs identified or communicated at this time. CM will continue to follow. Marney Doctor RN,BSN,NCM [id number] Lynnell Catalan, RN 12/24/2015, 12:05 PM

## 2015-12-25 DIAGNOSIS — D638 Anemia in other chronic diseases classified elsewhere: Secondary | ICD-10-CM

## 2015-12-25 DIAGNOSIS — K5909 Other constipation: Secondary | ICD-10-CM

## 2015-12-25 LAB — BASIC METABOLIC PANEL
Anion gap: 7 (ref 5–15)
CALCIUM: 9.6 mg/dL (ref 8.9–10.3)
CHLORIDE: 105 mmol/L (ref 101–111)
CO2: 27 mmol/L (ref 22–32)
GLUCOSE: 107 mg/dL — AB (ref 65–99)
Potassium: 4.2 mmol/L (ref 3.5–5.1)
Sodium: 139 mmol/L (ref 135–145)

## 2015-12-25 MED ORDER — OXYCODONE HCL 5 MG PO TABS
5.0000 mg | ORAL_TABLET | ORAL | Status: DC
  Administered 2015-12-25 (×2): 5 mg via ORAL
  Filled 2015-12-25 (×2): qty 1

## 2015-12-25 MED ORDER — ZOLPIDEM TARTRATE 5 MG PO TABS
5.0000 mg | ORAL_TABLET | Freq: Every evening | ORAL | Status: DC | PRN
  Administered 2015-12-25: 5 mg via ORAL
  Filled 2015-12-25: qty 1

## 2015-12-25 MED ORDER — OXYCODONE-ACETAMINOPHEN 5-325 MG PO TABS
1.0000 | ORAL_TABLET | ORAL | Status: DC
  Administered 2015-12-25 (×2): 1 via ORAL
  Filled 2015-12-25 (×2): qty 1

## 2015-12-25 MED ORDER — HYDROMORPHONE HCL 2 MG/ML IJ SOLN
2.0000 mg | Freq: Once | INTRAMUSCULAR | Status: AC
  Administered 2015-12-25: 2 mg via INTRAVENOUS

## 2015-12-25 NOTE — Progress Notes (Signed)
Pt discharged home in stable condition. Discharge instructions given. Pt verbalized understanding.  

## 2015-12-25 NOTE — Discharge Summary (Signed)
Carmen [name] MRN: [id number] DOB/AGE: [birth year] 25 y.o.  Admit date: 12/19/2015 Discharge date: 12/25/2015  Primary Care Physician:  [name], MD   Discharge Diagnoses:   Patient Active Problem List   Diagnosis Date Noted  . Sickle-cell crisis ([place]) 12/19/2015  . Sickle cell anemia with crisis ([place]) 12/19/2015  . Chest pain 11/10/2015  . Hb-SS disease with crisis ([place]) 10/17/2015  . Sickle cell disease with crisis ([place]) 09/09/2015  . Dental caries   . Pain in a tooth or teeth   . Right facial swelling   . HCAP (healthcare-associated pneumonia) 02/28/2015  . Sickle cell crisis ([place]) 02/28/2015  . Sickle cell anemia with pain ([place]) 02/16/2015  . 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 Acute calculous cholecystitis s/p lap chole 11/30/2014 11/30/2014  . Hypokalemia 11/28/2014  . Atypical chest pain 11/28/2014  . Leukocytosis 11/22/2014  . Thrombocytosis ([place]) 11/22/2014  . Anemia 05/16/2014  . Migraine, unspecified, without mention of intractable migraine without mention of status migrainosus 05/16/2014  . Sickle cell pain crisis ([place]) 11/12/2013    DISCHARGE MEDICATION:   Medication List    STOP taking these medications        oxyCODONE-acetaminophen 10-325 MG tablet  Commonly known as:  PERCOCET      TAKE these medications        ibuprofen 800 MG tablet  Commonly known as:  ADVIL,MOTRIN  Take 800 mg by mouth 3 (three) times daily as needed for headache or moderate pain. pain     naproxen 375 MG tablet  Commonly known as:  NAPROSYN  Take 375 mg by mouth 2 (two) times daily as needed for mild pain or moderate pain.          Consults:     SIGNIFICANT DIAGNOSTIC STUDIES:  Dg Chest 2 View  12/06/2015  CLINICAL DATA:  Sickle cell. Chest pain since yesterday. Shortness of breath and cough. EXAM: CHEST  2 VIEW COMPARISON:  11/14/2015 FINDINGS: The cardiomediastinal silhouette is within normal limits. The lungs are well inflated  with minimal chronic scarring in the right lung base and unchanged, slight peribronchial thickening. There is no evidence of confluent opacity, edema, pleural effusion, or pneumothorax. Right upper quadrant abdominal surgical clips are noted. No acute osseous abnormality is identified. IMPRESSION: No active cardiopulmonary disease. Electronically Signed   By: Logan Bores M.D.   On: 12/06/2015 07:40   Ir Fluoro Guide Cv Line Right  12/22/2015  INDICATION: Sickle cell crisis. In need of durable intravenous access for medication administration and blood draws for EXAM: ULTRASOUND AND FLUOROSCOPIC GUIDED PICC LINE INSERTION MEDICATIONS: None. CONTRAST:  None FLUOROSCOPY TIME:  12 seconds (2 mGy) COMPLICATIONS: None immediate. TECHNIQUE: The procedure, risks, benefits, and alternatives were explained to the patient and informed written consent was obtained. A timeout was performed prior to the initiation of the procedure. The right upper extremity was prepped with chlorhexidine in a sterile fashion, and a sterile drape was applied covering the operative field. Maximum barrier sterile technique with sterile gowns and gloves were used for the procedure. A timeout was performed prior to the initiation of the procedure. Local anesthesia was provided with 1% lidocaine. Under direct ultrasound guidance, the right brachial vein was accessed with a micropuncture kit after the overlying soft tissues were anesthetized with 1% lidocaine. An ultrasound image was saved for documentation purposes. A guidewire was advanced to the level of the superior caval-atrial junction for measurement purposes and the PICC line was  cut to length. A peel-away sheath was placed and a 36 cm, 5 [place], dual lumen was inserted to level of the superior caval-atrial junction. A post procedure spot fluoroscopic was obtained. The catheter easily aspirated and flushed and was sutured in place. A dressing was placed. The patient tolerated the procedure  well without immediate post procedural complication. FINDINGS: After catheter placement, the tip lies within the superior cavoatrial junction. The catheter aspirates and flushes normally and is ready for immediate use. IMPRESSION: Successful ultrasound and fluoroscopic guided placement of a right brachial vein approach, 36 cm, 5 French, dual lumen PICC with tip at the superior caval-atrial junction. The PICC line is ready for immediate use. Electronically Signed   By: Sandi Mariscal M.D.   On: 12/22/2015 17:32   Ir US Guide Vasc Access Right  12/22/2015  INDICATION: Sickle cell crisis. In need of durable intravenous access for medication administration and blood draws for EXAM: ULTRASOUND AND FLUOROSCOPIC GUIDED PICC LINE INSERTION MEDICATIONS: None. CONTRAST:  None FLUOROSCOPY TIME:  12 seconds (2 mGy) COMPLICATIONS: None immediate. TECHNIQUE: The procedure, risks, benefits, and alternatives were explained to the patient and informed written consent was obtained. A timeout was performed prior to the initiation of the procedure. The right upper extremity was prepped with chlorhexidine in a sterile fashion, and a sterile drape was applied covering the operative field. Maximum barrier sterile technique with sterile gowns and gloves were used for the procedure. A timeout was performed prior to the initiation of the procedure. Local anesthesia was provided with 1% lidocaine. Under direct ultrasound guidance, the right brachial vein was accessed with a micropuncture kit after the overlying soft tissues were anesthetized with 1% lidocaine. An ultrasound image was saved for documentation purposes. A guidewire was advanced to the level of the superior caval-atrial junction for measurement purposes and the PICC line was cut to length. A peel-away sheath was placed and a 36 cm, 5 [place], dual lumen was inserted to level of the superior caval-atrial junction. A post procedure spot fluoroscopic was obtained. The catheter easily  aspirated and flushed and was sutured in place. A dressing was placed. The patient tolerated the procedure well without immediate post procedural complication. FINDINGS: After catheter placement, the tip lies within the superior cavoatrial junction. The catheter aspirates and flushes normally and is ready for immediate use. IMPRESSION: Successful ultrasound and fluoroscopic guided placement of a right brachial vein approach, 36 cm, 5 French, dual lumen PICC with tip at the superior caval-atrial junction. The PICC line is ready for immediate use. Electronically Signed   By: Sandi Mariscal M.D.   On: 12/22/2015 17:32       Recent Results (from the past 240 hour(s))  Culture, Urine     Status: None   Collection Time: 12/21/15 12:35 PM  Result Value Ref Range Status   Specimen Description URINE, CLEAN CATCH  Final   Special Requests NONE  Final   Culture   Final    >=100,000 COLONIES/mL LACTOBACILLUS SPECIES Standardized susceptibility testing for this organism is not available. Performed at [facility]    Report Status 12/24/2015 FINAL  Final    BRIEF ADMITTING H & P: 25 yo with history of sickle cell disease presenting to the ER with 3 day hoistory of pain in her limbs, back and legs. She had tooth abscess which triggered her attack. She took more of her home pain medications and run out 4 days ago. She said she called her physician to complain but  did not receive extra pain medications. She now has 10/10 pain which is not relieved by the Dilaudid injections she received in the ER. She denies SOB, no Cough, No NVD. She is becoming Opiate Tolerant over the last few months. She was naive for the most part.   Hospital Course:  Present on Admission:  . Sickle cell disease with crisis [facility]): Pt's pain was managed with IV Dilaudid, Toradol and IVF. As her pain improved, she was transitioned over to oral analgesics. At the time of discharge, pain was well controlled with oral analgesics. She is  discharged home on her pre-hospital regimen.  . Anemia of chronic disease: Hb remained at baseline throughout hospital course. No transfusions required. . Hypokalemia: Pt had a concurrently low magnesium. Magnesium was replaced by IV and Potasium replaced orally. Potassium was normal at the time of discharge. . Constipation: Pt remained constipated throughout most of her hospital stay despite Senna-S and MiraLAX. Magnesium citrate was given and pt had good results.   Disposition and Follow-up:  Pt discharged home in good condition. She is to follow up with her PMD- Dr. Alyson Ingles as needed.     Discharge Instructions    Activity as tolerated - No restrictions    Complete by:  As directed      Diet general    Complete by:  As directed            DISCHARGE EXAM:  General: Alert, awake, oriented x3, in no apparent distress.  Vital Signs: BP 134/88, HR 87, T 98.3 F (36.8 C), temperature source Oral, RR 11, height '5\' 3"'  (1.6 m), weight 181 lb 4.8 oz (82.237 kg), last menstrual period 11/27/2015, SpO2 100 %. HEENT: [place]/AT PEERL, EOMI, anicteric Neck: Trachea midline, no masses, no thyromegal,y no JVD, no carotid bruit OROPHARYNX: Moist, No exudate/ erythema/lesions.  Heart: Regular rate and rhythm, without murmurs, rubs, gallops or S3. PMI non-displaced. Exam reveals no decreased pulses. Pulmonary/Chest: Normal effort. Breath sounds normal. No. Apnea. Clear to auscultation,no stridor,  no wheezing and no rhonchi noted. No respiratory distress and no tenderness noted. Abdomen: Soft, nontender, nondistended, normal bowel sounds, no masses no hepatosplenomegaly noted. No fluid wave and no ascites. There is no guarding or rebound. Neuro: Alert and oriented to person, place and time. Normal motor skills, Displays no atrophy or tremors and exhibits normal muscle tone.  No focal neurological deficits noted cranial nerves II through XII grossly intact. No sensory deficit noted. Strength at baseline  in bilateral upper and lower extremities. Gait normal. Musculoskeletal: No warm swelling or erythema around joints, no spinal tenderness noted. Psychiatric: Patient alert and oriented x3, good insight and cognition, good recent to remote recall. Mood, memory, affect and judgement normal Lymph node survey: No cervical axillary or inguinal lymphadenopathy noted. Skin: Skin is warm and dry. No bruising, no ecchymosis and no rash noted. Pt is not diaphoretic. No erythema. No pallor      Recent Labs  12/24/15 0520 12/24/15 0700 12/25/15 0500  NA 140  --  139  K 3.2*  --  4.2  CL 103  --  105  CO2 28  --  27  GLUCOSE 98  --  107*  BUN <5*  --  <5*  CREATININE 0.32*  --  <0.30*  CALCIUM 9.4  --  9.6  MG  --  1.7  --    No results for input(s): AST, ALT, ALKPHOS, BILITOT, PROT, ALBUMIN in the last 72 hours. No results for input(s): LIPASE,  AMYLASE in the last 72 hours.  Recent Labs  12/24/15 0520  WBC 12.2*  NEUTROABS 5.5  HGB 7.6*  HCT 21.9*  MCV 84.6  PLT 553*     Total time spent including face to face and decision making was greater than 30 minutes  Signed: Emmarie Sannes A. 12/25/2015, 3:25 PM

## 2016-01-27 ENCOUNTER — Encounter (HOSPITAL_COMMUNITY): Payer: Self-pay | Admitting: Emergency Medicine

## 2016-01-27 ENCOUNTER — Emergency Department (HOSPITAL_COMMUNITY)
Admission: EM | Admit: 2016-01-27 | Discharge: 2016-01-27 | Disposition: A | Discharge status: Home / Self Care | Payer: Medicaid Other | Attending: Emergency Medicine | Admitting: Emergency Medicine

## 2016-01-27 DIAGNOSIS — Z792 Long term (current) use of antibiotics: Secondary | ICD-10-CM | POA: Insufficient documentation

## 2016-01-27 DIAGNOSIS — R42 Dizziness and giddiness: Secondary | ICD-10-CM | POA: Insufficient documentation

## 2016-01-27 DIAGNOSIS — R630 Anorexia: Secondary | ICD-10-CM | POA: Diagnosis not present

## 2016-01-27 DIAGNOSIS — D5700 Hb-SS disease with crisis, unspecified: Secondary | ICD-10-CM | POA: Diagnosis not present

## 2016-01-27 LAB — CBC WITH DIFFERENTIAL/PLATELET
Basophils Absolute: 0 10*3/uL (ref 0.0–0.1)
Basophils Relative: 0 %
Eosinophils Absolute: 0.1 10*3/uL (ref 0.0–0.7)
Eosinophils Relative: 1 %
HEMATOCRIT: 25.7 % — AB (ref 36.0–46.0)
HEMOGLOBIN: 9.4 g/dL — AB (ref 12.0–15.0)
LYMPHS ABS: 5.1 10*3/uL — AB (ref 0.7–4.0)
LYMPHS PCT: 46 %
MCH: 30.7 pg (ref 26.0–34.0)
MCHC: 36.6 g/dL — ABNORMAL HIGH (ref 30.0–36.0)
MCV: 84 fL (ref 78.0–100.0)
MONOS PCT: 11 %
Monocytes Absolute: 1.2 10*3/uL — ABNORMAL HIGH (ref 0.1–1.0)
NEUTROS PCT: 43 %
Neutro Abs: 4.8 10*3/uL (ref 1.7–7.7)
Platelets: 580 10*3/uL — ABNORMAL HIGH (ref 150–400)
RBC: 3.06 MIL/uL — AB (ref 3.87–5.11)
RDW: 16.6 % — ABNORMAL HIGH (ref 11.5–15.5)
WBC: 11.2 10*3/uL — AB (ref 4.0–10.5)

## 2016-01-27 LAB — COMPREHENSIVE METABOLIC PANEL
ALK PHOS: 64 U/L (ref 38–126)
ALT: 14 U/L (ref 14–54)
AST: 19 U/L (ref 15–41)
Albumin: 4.4 g/dL (ref 3.5–5.0)
Anion gap: 11 (ref 5–15)
BILIRUBIN TOTAL: 1.8 mg/dL — AB (ref 0.3–1.2)
BUN: 7 mg/dL (ref 6–20)
CO2: 21 mmol/L — AB (ref 22–32)
CREATININE: 0.49 mg/dL (ref 0.44–1.00)
Calcium: 9.4 mg/dL (ref 8.9–10.3)
Chloride: 110 mmol/L (ref 101–111)
Glucose, Bld: 91 mg/dL (ref 65–99)
Potassium: 3.4 mmol/L — ABNORMAL LOW (ref 3.5–5.1)
Sodium: 142 mmol/L (ref 135–145)
Total Protein: 7.9 g/dL (ref 6.5–8.1)

## 2016-01-27 LAB — RETICULOCYTES
RBC.: 3.06 MIL/uL — ABNORMAL LOW (ref 3.87–5.11)
RETIC CT PCT: 10.4 % — AB (ref 0.4–3.1)
Retic Count, Absolute: 318.2 10*3/uL — ABNORMAL HIGH (ref 19.0–186.0)

## 2016-01-27 MED ORDER — HYDROMORPHONE HCL 2 MG/ML IJ SOLN
0.0250 mg/kg | INTRAMUSCULAR | Status: AC
  Filled 2016-01-27: qty 2

## 2016-01-27 MED ORDER — ONDANSETRON 8 MG PO TBDP
8.0000 mg | ORAL_TABLET | Freq: Once | ORAL | Status: AC
  Administered 2016-01-27: 8 mg via ORAL
  Filled 2016-01-27: qty 1

## 2016-01-27 MED ORDER — HYDROMORPHONE HCL 2 MG/ML IJ SOLN
0.0250 mg/kg | INTRAMUSCULAR | Status: AC
  Administered 2016-01-27: 2.1 mg via SUBCUTANEOUS
  Filled 2016-01-27: qty 2

## 2016-01-27 MED ORDER — HYDROMORPHONE HCL 2 MG/ML IJ SOLN
0.0313 mg/kg | INTRAMUSCULAR | Status: AC
  Administered 2016-01-27: 2.6 mg via SUBCUTANEOUS
  Filled 2016-01-27: qty 2

## 2016-01-27 MED ORDER — DIPHENHYDRAMINE HCL 25 MG PO CAPS
25.0000 mg | ORAL_CAPSULE | Freq: Once | ORAL | Status: AC
  Administered 2016-01-27: 25 mg via ORAL
  Filled 2016-01-27: qty 1

## 2016-01-27 MED ORDER — DIPHENHYDRAMINE HCL 50 MG/ML IJ SOLN
25.0000 mg | Freq: Once | INTRAMUSCULAR | Status: DC
  Filled 2016-01-27: qty 1

## 2016-01-27 MED ORDER — HYDROMORPHONE HCL 2 MG/ML IJ SOLN: 0.0313 mg/kg | INTRAMUSCULAR | Status: AC

## 2016-01-27 NOTE — ED Notes (Signed)
No answer in WR

## 2016-01-27 NOTE — ED Notes (Signed)
Delay in medication administration due to no IV access

## 2016-01-27 NOTE — ED Notes (Signed)
Pt states she has PCP appt today at 10am

## 2016-01-27 NOTE — ED Notes (Signed)
Anderson Malta, RN attempting IV insertion

## 2016-01-27 NOTE — ED Notes (Signed)
Pt c/o SS pain to low back, legs, and arms x 1 week. Pt states she is unable to see her MD, her appt was cancelled d/t MD having flu

## 2016-01-27 NOTE — ED Provider Notes (Signed)
CSN: [id number]     Arrival date & time 01/27/16  0348 History   First MD Initiated Contact with Patient 01/27/16 [phone]     Chief Complaint  Patient presents with  . Sickle Cell Pain Crisis     (Consider location/radiation/quality/duration/timing/severity/associated sxs/prior Treatment) HPI Comments: Patient is a 25yo F with history of SCD who presents with a 1 week history of pain from her shoulders to her ankles, but worse in her low back and lower legs. Patient describes her pain as sharp, throbbing and gradually worsening over the past week. She rates her pain as a 10/10. She states she had an appointment with her doctor but it was cancelled due to the provider having the flu, so she was not able to get her Percocet refilled. She is normally able to control her pain with 1-2 Percocet at home, but does state she has been given Dilaudid with relief in the hospital when her pain is this bad. She reports her pain is worse with weather changes. She reports some associated generalized fatigue, decreased appetite, and lightheadedness today because of the pain.  Patient is a 25 y.o. female presenting with sickle cell pain. The history is provided by the patient.  Sickle Cell Pain Crisis Associated symptoms: fatigue   Associated symptoms: no chest pain, no fever, no headaches, no nausea, no shortness of breath and no vomiting     Past Medical History  Diagnosis Date  . Sickle cell crisis ([place])   . Headache(784.0)   . Abortion     05/2012   Past Surgical History  Procedure Laterality Date  . Splenectomy    . Cholecystectomy N/A 11/30/2014    Procedure: LAPAROSCOPIC CHOLECYSTECTOMY SINGLE SITE WITH INTRAOPERATIVE CHOLANGIOGRAM;  Surgeon: Michael Boston, MD;  Location: [facility] ORS;  Service: General;  Laterality: N/A;   Family History  Problem Relation Age of Onset  . Hypertension Mother   . Sickle cell anemia Sister   . Kidney disease Sister     Lupus  . Arthritis Sister   . Sickle cell anemia  Sister   . Sickle cell trait Sister   . Heart disease Maternal Aunt   . Heart disease Maternal Uncle    Social History  Substance Use Topics  . Smoking status: Never Smoker   . Smokeless tobacco: Never Used  . Alcohol Use: No   OB History    Gravida Para Term Preterm AB TAB SAB Ectopic Multiple Living   1    1          Review of Systems  Constitutional: Positive for appetite change and fatigue. Negative for fever and chills.  HENT: Negative for facial swelling.   Respiratory: Negative for shortness of breath.   Cardiovascular: Negative for chest pain.  Gastrointestinal: Negative for nausea, vomiting and abdominal pain.  Genitourinary: Negative for dysuria.  Musculoskeletal: Positive for myalgias and back pain.  Skin: Negative for rash and wound.  Neurological: Positive for light-headedness (pt reports just today from the pain). Negative for dizziness, weakness and headaches.  Psychiatric/Behavioral: The patient is not nervous/anxious.       Allergies  Review of patient's allergies indicates no known allergies.  Home Medications   Prior to Admission medications   Medication Sig Start Date End Date Taking? Authorizing Provider  amoxicillin (AMOXIL) 500 MG capsule Take 500 mg by mouth 3 (three) times daily.   Yes Historical Provider, MD  ibuprofen (ADVIL,MOTRIN) 800 MG tablet Take 800 mg by mouth 3 (three) times daily as  needed for headache or moderate pain. pain 12/01/15  Yes Historical Provider, MD  zolpidem (AMBIEN) 10 MG tablet Take 1 tablet by mouth at bedtime as needed for sleep.  01/07/16  Yes Historical Provider, MD   BP 104/86 mmHg  Pulse 87  Temp(Src) 97.9 F (36.6 C) (Oral)  Resp 18  Wt 82 kg  SpO2 94%  LMP 12/22/2015 Physical Exam  Constitutional: She appears well-developed and well-nourished. No distress.  HENT:  Head: Normocephalic and atraumatic.  Mouth/Throat: Oropharynx is clear and moist. No oropharyngeal exudate.  Eyes: Conjunctivae are normal.  Pupils are equal, round, and reactive to light. Right eye exhibits no discharge. Left eye exhibits no discharge. No scleral icterus.  Neck: Normal range of motion. Neck supple. No thyromegaly present.  Cardiovascular: Normal rate, regular rhythm, normal heart sounds and intact distal pulses.  Exam reveals no gallop and no friction rub.   No murmur heard. Pulmonary/Chest: Effort normal and breath sounds normal. No stridor. No respiratory distress. She has no wheezes. She has no rales.  Abdominal: Soft. Bowel sounds are normal. She exhibits no distension. There is no tenderness. There is no rebound and no guarding.  Musculoskeletal: She exhibits no edema.       Lumbar back: She exhibits tenderness and pain. She exhibits no bony tenderness.  Patient reports pain in her legs below her knee, including her ankle; pain no worse on palpation; -SLR; 5/5 strength on plantar flexion, dorsiflexion, and hip flexion  Lymphadenopathy:    She has no cervical adenopathy.  Neurological: She is alert. Coordination normal.  Skin: Skin is warm and dry. No rash noted. She is not diaphoretic. No pallor.  Psychiatric: She has a normal mood and affect.  Nursing note and vitals reviewed.   ED Course  Procedures (including critical care time) Labs Review Labs Reviewed  CBC WITH DIFFERENTIAL/PLATELET - Abnormal; Notable for the following:    WBC 11.2 (*)    RBC 3.06 (*)    Hemoglobin 9.4 (*)    HCT 25.7 (*)    MCHC 36.6 (*)    RDW 16.6 (*)    Platelets 580 (*)    Lymphs Abs 5.1 (*)    Monocytes Absolute 1.2 (*)    All other components within normal limits  COMPREHENSIVE METABOLIC PANEL - Abnormal; Notable for the following:    Potassium 3.4 (*)    CO2 21 (*)    Total Bilirubin 1.8 (*)    All other components within normal limits  RETICULOCYTES - Abnormal; Notable for the following:    Retic Ct Pct 10.4 (*)    RBC. 3.06 (*)    Retic Count, Manual 318.2 (*)    All other components within normal limits     Imaging Review No results found. I have personally reviewed and evaluated these images and lab results as part of my medical decision-making.   EKG Interpretation None      MDM   CBC shows WBC 11.2, RBC 3.06, Hgb 9.4, Hct 25.7. Consistent with [place] anemia. Retic count manual 318.2, retic count Pct 10.4. CMP shows elevated total bili. Patient treated with 2 doses of dilaudid with moderate relief. Benadryl given. Patient had nausea and vomiting after second dose and was given Zofran with good relief. Vitals stable. Patient has appointment with doctor this morning where she will have her at home Percocet refilled. Patient had good pain relief and was ready to go so she could make it to her appointment.  Return precautions were discussed  with the patient and included in discharge instructions. Patient understands and agrees with the plan.   Final diagnoses:  Sickle-cell crisis [facility])       Frederica Kuster, PA-C 01/27/16 1149  Everlene Balls, MD 01/27/16 1601

## 2016-01-27 NOTE — Discharge Instructions (Signed)
Please follow up with your doctor as needed and for refill of your pain medicine. Please return to the emergency department if you develop fever, shortness of breath, chest pain, continued uncontrolled pain, or any other concerning symptoms.   Sickle Cell Anemia, Adult Sickle cell anemia is a condition in which red blood cells have an abnormal "sickle" shape. This abnormal shape shortens the cells' life span, which results in a lower than normal concentration of red blood cells in the blood. The sickle shape also causes the cells to clump together and block free blood flow through the blood vessels. As a result, the tissues and organs of the body do not receive enough oxygen. Sickle cell anemia causes organ damage and pain and increases the risk of infection. CAUSES  Sickle cell anemia is a genetic disorder. Those who receive two copies of the gene have the condition, and those who receive one copy have the trait. RISK FACTORS The sickle cell gene is most common in people whose families originated in [place]. Other areas of the globe where sickle cell trait occurs include the [place], [place], and the [place].  SIGNS AND SYMPTOMS  Pain, especially in the extremities, back, chest, or abdomen (common). The pain may start suddenly or may develop following an illness, especially if there is dehydration. Pain can also occur due to overexertion or exposure to extreme temperature changes.  Frequent severe bacterial infections, especially certain types of pneumonia and meningitis.  Pain and swelling in the hands and feet.  Decreased activity.   Loss of appetite.   Change in behavior.  Headaches.  Seizures.  Shortness of breath or difficulty breathing.  Vision changes.  Skin ulcers. Those with the trait may not have symptoms or they may have mild symptoms.  DIAGNOSIS  Sickle cell anemia is diagnosed with blood tests that demonstrate the genetic  trait. It is often diagnosed during the newborn period, due to mandatory testing nationwide. A variety of blood tests, X-rays, CT scans, MRI scans, ultrasounds, and lung function tests may also be done to monitor the condition. TREATMENT  Sickle cell anemia may be treated with:  Medicines. You may be given pain medicines, antibiotic medicines (to treat and prevent infections) or medicines to increase the production of certain types of hemoglobin.  Fluids.  Oxygen.  Blood transfusions. HOME CARE INSTRUCTIONS   Drink enough fluid to keep your urine clear or pale yellow. Increase your fluid intake in hot weather and during exercise.  Do not smoke. Smoking lowers oxygen levels in the blood.   Only take over-the-counter or prescription medicines for pain, fever, or discomfort as directed by your health care provider.  Take antibiotics as directed by your health care provider. Make sure you finish them it even if you start to feel better.   Take supplements as directed by your health care provider.   Consider wearing a medical alert bracelet. This tells anyone caring for you in an emergency of your condition.   When traveling, keep your medical information, health care provider's names, and the medicines you take with you at all times.   If you develop a fever, do not take medicines to reduce the fever right away. This could cover up a problem that is developing. Notify your health care provider.  Keep all follow-up appointments with your health care provider. Sickle cell anemia requires regular medical care. SEEK MEDICAL CARE IF: You have a fever. SEEK IMMEDIATE MEDICAL CARE IF:   You  feel dizzy or faint.   You have new abdominal pain, especially on the left side near the stomach area.   You develop a persistent, often uncomfortable and painful penile erection (priapism). If this is not treated immediately it will lead to impotence.   You have numbness your arms or legs or  you have a hard time moving them.   You have a hard time with speech.   You have a fever or persistent symptoms for more than 2-3 days.   You have a fever and your symptoms suddenly get worse.   You have signs or symptoms of infection. These include:   Chills.   Abnormal tiredness (lethargy).   Irritability.   Poor eating.   Vomiting.   You develop pain that is not helped with medicine.   You develop shortness of breath.  You have pain in your chest.   You are coughing up pus-like or bloody sputum.   You develop a stiff neck.  Your feet or hands swell or have pain.  Your abdomen appears bloated.  You develop joint pain. MAKE SURE YOU:  Understand these instructions.   This information is not intended to replace advice given to you by your health care provider. Make sure you discuss any questions you have with your health care provider.   Document Released: 01/26/2006 Document Revised: 11/08/2014 Document Reviewed: 05/30/2013 Elsevier Interactive Patient Education [employer].

## 2016-02-10 ENCOUNTER — Emergency Department (HOSPITAL_COMMUNITY): Payer: Medicaid Other

## 2016-02-10 ENCOUNTER — Inpatient Hospital Stay (HOSPITAL_COMMUNITY)
Admission: EM | Admit: 2016-02-10 | Discharge: 2016-02-15 | DRG: 812 | Disposition: A | Discharge status: Home / Self Care | Payer: Medicaid Other | Attending: Internal Medicine | Admitting: Internal Medicine

## 2016-02-10 ENCOUNTER — Encounter (HOSPITAL_COMMUNITY): Payer: Self-pay | Admitting: Emergency Medicine

## 2016-02-10 DIAGNOSIS — J069 Acute upper respiratory infection, unspecified: Secondary | ICD-10-CM | POA: Diagnosis present

## 2016-02-10 DIAGNOSIS — D5700 Hb-SS disease with crisis, unspecified: Secondary | ICD-10-CM | POA: Diagnosis not present

## 2016-02-10 DIAGNOSIS — D75839 Thrombocytosis, unspecified: Secondary | ICD-10-CM | POA: Diagnosis present

## 2016-02-10 DIAGNOSIS — D7589 Other specified diseases of blood and blood-forming organs: Secondary | ICD-10-CM | POA: Diagnosis present

## 2016-02-10 DIAGNOSIS — D638 Anemia in other chronic diseases classified elsewhere: Secondary | ICD-10-CM | POA: Diagnosis present

## 2016-02-10 DIAGNOSIS — D72829 Elevated white blood cell count, unspecified: Secondary | ICD-10-CM | POA: Diagnosis not present

## 2016-02-10 DIAGNOSIS — D473 Essential (hemorrhagic) thrombocythemia: Secondary | ICD-10-CM | POA: Diagnosis not present

## 2016-02-10 DIAGNOSIS — J101 Influenza due to other identified influenza virus with other respiratory manifestations: Secondary | ICD-10-CM | POA: Diagnosis present

## 2016-02-10 DIAGNOSIS — E860 Dehydration: Secondary | ICD-10-CM | POA: Diagnosis present

## 2016-02-10 DIAGNOSIS — D571 Sickle-cell disease without crisis: Secondary | ICD-10-CM | POA: Diagnosis present

## 2016-02-10 DIAGNOSIS — O99019 Anemia complicating pregnancy, unspecified trimester: Secondary | ICD-10-CM

## 2016-02-10 DIAGNOSIS — Z9081 Acquired absence of spleen: Secondary | ICD-10-CM

## 2016-02-10 DIAGNOSIS — Z9049 Acquired absence of other specified parts of digestive tract: Secondary | ICD-10-CM

## 2016-02-10 LAB — MAGNESIUM: Magnesium: 1.7 mg/dL (ref 1.7–2.4)

## 2016-02-10 LAB — CBC WITH DIFFERENTIAL/PLATELET
BASOS ABS: 0 10*3/uL (ref 0.0–0.1)
BASOS PCT: 0 %
EOS ABS: 0.1 10*3/uL (ref 0.0–0.7)
Eosinophils Relative: 1 %
HCT: 30 % — ABNORMAL LOW (ref 36.0–46.0)
HEMOGLOBIN: 10.5 g/dL — AB (ref 12.0–15.0)
LYMPHS ABS: 1.2 10*3/uL (ref 0.7–4.0)
Lymphocytes Relative: 9 %
MCH: 29.6 pg (ref 26.0–34.0)
MCHC: 35 g/dL (ref 30.0–36.0)
MCV: 84.5 fL (ref 78.0–100.0)
Monocytes Absolute: 1.3 10*3/uL — ABNORMAL HIGH (ref 0.1–1.0)
Monocytes Relative: 10 %
NEUTROS PCT: 80 %
Neutro Abs: 10.6 10*3/uL — ABNORMAL HIGH (ref 1.7–7.7)
Platelets: 538 10*3/uL — ABNORMAL HIGH (ref 150–400)
RBC: 3.55 MIL/uL — AB (ref 3.87–5.11)
RDW: 15.9 % — AB (ref 11.5–15.5)
WBC: 13.2 10*3/uL — AB (ref 4.0–10.5)

## 2016-02-10 LAB — COMPREHENSIVE METABOLIC PANEL
ALT: 19 U/L (ref 14–54)
AST: 23 U/L (ref 15–41)
Albumin: 4.8 g/dL (ref 3.5–5.0)
Alkaline Phosphatase: 54 U/L (ref 38–126)
Anion gap: 9 (ref 5–15)
BUN: 7 mg/dL (ref 6–20)
CALCIUM: 9.6 mg/dL (ref 8.9–10.3)
CHLORIDE: 110 mmol/L (ref 101–111)
CO2: 19 mmol/L — ABNORMAL LOW (ref 22–32)
CREATININE: 0.52 mg/dL (ref 0.44–1.00)
Glucose, Bld: 99 mg/dL (ref 65–99)
Potassium: 3.6 mmol/L (ref 3.5–5.1)
Sodium: 138 mmol/L (ref 135–145)
Total Bilirubin: 1.8 mg/dL — ABNORMAL HIGH (ref 0.3–1.2)
Total Protein: 8.4 g/dL — ABNORMAL HIGH (ref 6.5–8.1)

## 2016-02-10 LAB — RETICULOCYTES
RBC.: 3.5 MIL/uL — AB (ref 3.87–5.11)
RETIC COUNT ABSOLUTE: 227.5 10*3/uL — AB (ref 19.0–186.0)
RETIC CT PCT: 6.5 % — AB (ref 0.4–3.1)

## 2016-02-10 LAB — URINALYSIS, ROUTINE W REFLEX MICROSCOPIC
BILIRUBIN URINE: NEGATIVE
Glucose, UA: NEGATIVE mg/dL
Hgb urine dipstick: NEGATIVE
KETONES UR: 40 mg/dL — AB
NITRITE: NEGATIVE
PH: 6 (ref 5.0–8.0)
PROTEIN: NEGATIVE mg/dL
Specific Gravity, Urine: 1.014 (ref 1.005–1.030)

## 2016-02-10 LAB — URINE MICROSCOPIC-ADD ON

## 2016-02-10 LAB — LACTATE DEHYDROGENASE: LDH: 206 U/L — AB (ref 98–192)

## 2016-02-10 LAB — POC URINE PREG, ED: Preg Test, Ur: NEGATIVE

## 2016-02-10 MED ORDER — SODIUM CHLORIDE 0.9% FLUSH: 9.0000 mL | INTRAVENOUS | Status: DC | PRN

## 2016-02-10 MED ORDER — POLYETHYLENE GLYCOL 3350 17 G PO PACK
17.0000 g | PACK | Freq: Every day | ORAL | Status: DC | PRN
  Filled 2016-02-10: qty 1

## 2016-02-10 MED ORDER — SODIUM CHLORIDE 0.9 % IV SOLN
12.5000 mg | Freq: Four times a day (QID) | INTRAVENOUS | Status: DC | PRN
  Administered 2016-02-11 (×2): 12.5 mg via INTRAVENOUS
  Filled 2016-02-10 (×5): qty 0.25

## 2016-02-10 MED ORDER — HYDROMORPHONE HCL 2 MG/ML IJ SOLN
2.0000 mg | INTRAMUSCULAR | Status: AC
  Administered 2016-02-10 (×3): 2 mg via INTRAVENOUS
  Filled 2016-02-10 (×3): qty 1

## 2016-02-10 MED ORDER — HYDROMORPHONE HCL 2 MG/ML IJ SOLN: 2.0000 mg | INTRAMUSCULAR | Status: DC | PRN

## 2016-02-10 MED ORDER — HYDROMORPHONE 1 MG/ML IV SOLN
INTRAVENOUS | Status: DC
  Administered 2016-02-10: 22:00:00 via INTRAVENOUS
  Administered 2016-02-11: 6.5 mg via INTRAVENOUS
  Administered 2016-02-11: 6 mg via INTRAVENOUS
  Administered 2016-02-11: 4.5 mg via INTRAVENOUS
  Administered 2016-02-11: 3.95 mg via INTRAVENOUS
  Administered 2016-02-11: 1 mg via INTRAVENOUS
  Administered 2016-02-11: 9 mg via INTRAVENOUS
  Administered 2016-02-11: 2.5 mg via INTRAVENOUS
  Filled 2016-02-10 (×2): qty 25

## 2016-02-10 MED ORDER — PHENOL 1.4 % MT LIQD
1.0000 | OROMUCOSAL | Status: DC | PRN
Start: 2016-02-10 — End: 2016-02-15
  Administered 2016-02-10: 1 via OROMUCOSAL
  Filled 2016-02-10: qty 177

## 2016-02-10 MED ORDER — HYDROMORPHONE HCL 2 MG/ML IJ SOLN
2.0000 mg | Freq: Once | INTRAMUSCULAR | Status: AC
  Administered 2016-02-10: 2 mg via INTRAVENOUS
  Filled 2016-02-10: qty 1

## 2016-02-10 MED ORDER — DIPHENHYDRAMINE HCL 50 MG/ML IJ SOLN
25.0000 mg | Freq: Once | INTRAMUSCULAR | Status: AC
  Administered 2016-02-10: 25 mg via INTRAVENOUS
  Filled 2016-02-10: qty 1

## 2016-02-10 MED ORDER — KETOROLAC TROMETHAMINE 30 MG/ML IJ SOLN
30.0000 mg | Freq: Once | INTRAMUSCULAR | Status: AC
  Administered 2016-02-10: 30 mg via INTRAVENOUS
  Filled 2016-02-10: qty 1

## 2016-02-10 MED ORDER — DIPHENHYDRAMINE HCL 12.5 MG/5ML PO ELIX: 12.5000 mg | ORAL_SOLUTION | Freq: Four times a day (QID) | ORAL | Status: DC | PRN

## 2016-02-10 MED ORDER — HYDROCORTISONE 1 % EX CREA
TOPICAL_CREAM | Freq: Two times a day (BID) | CUTANEOUS | Status: DC
  Administered 2016-02-10 – 2016-02-12 (×4): via TOPICAL
  Administered 2016-02-12: 1 via TOPICAL
  Administered 2016-02-13 – 2016-02-14 (×4): via TOPICAL
  Filled 2016-02-10 (×2): qty 28

## 2016-02-10 MED ORDER — SENNOSIDES-DOCUSATE SODIUM 8.6-50 MG PO TABS
1.0000 | ORAL_TABLET | Freq: Two times a day (BID) | ORAL | Status: DC
  Administered 2016-02-10 – 2016-02-15 (×10): 1 via ORAL
  Filled 2016-02-10 (×10): qty 1

## 2016-02-10 MED ORDER — FOLIC ACID 1 MG PO TABS
1.0000 mg | ORAL_TABLET | Freq: Every day | ORAL | Status: DC
  Administered 2016-02-10 – 2016-02-15 (×6): 1 mg via ORAL
  Filled 2016-02-10 (×6): qty 1

## 2016-02-10 MED ORDER — GUAIFENESIN ER 600 MG PO TB12
1200.0000 mg | ORAL_TABLET | Freq: Two times a day (BID) | ORAL | Status: DC
  Administered 2016-02-10 – 2016-02-15 (×10): 1200 mg via ORAL
  Filled 2016-02-10 (×15): qty 2

## 2016-02-10 MED ORDER — HYDROCOD POLST-CPM POLST ER 10-8 MG/5ML PO SUER
5.0000 mL | Freq: Once | ORAL | Status: AC
  Administered 2016-02-10: 5 mL via ORAL
  Filled 2016-02-10: qty 5

## 2016-02-10 MED ORDER — DEXTROSE-NACL 5-0.45 % IV SOLN
INTRAVENOUS | Status: DC
  Administered 2016-02-10: 125 mL/h via INTRAVENOUS
  Administered 2016-02-11 – 2016-02-14 (×6): via INTRAVENOUS

## 2016-02-10 MED ORDER — ONDANSETRON HCL 4 MG/2ML IJ SOLN
4.0000 mg | INTRAMUSCULAR | Status: DC | PRN
  Administered 2016-02-10: 4 mg via INTRAVENOUS
  Filled 2016-02-10: qty 2

## 2016-02-10 MED ORDER — ONDANSETRON HCL 4 MG/2ML IJ SOLN
4.0000 mg | Freq: Four times a day (QID) | INTRAMUSCULAR | Status: DC | PRN
  Administered 2016-02-11: 4 mg via INTRAVENOUS
  Filled 2016-02-10: qty 2

## 2016-02-10 MED ORDER — DIPHENHYDRAMINE HCL 25 MG PO CAPS
25.0000 mg | ORAL_CAPSULE | ORAL | Status: DC | PRN
  Administered 2016-02-10: 25 mg via ORAL
  Filled 2016-02-10 (×2): qty 1

## 2016-02-10 MED ORDER — ENOXAPARIN SODIUM 40 MG/0.4ML ~~LOC~~ SOLN
40.0000 mg | SUBCUTANEOUS | Status: DC
  Administered 2016-02-10 – 2016-02-14 (×5): 40 mg via SUBCUTANEOUS
  Filled 2016-02-10 (×6): qty 0.4

## 2016-02-10 MED ORDER — NALOXONE HCL 0.4 MG/ML IJ SOLN: 0.4000 mg | INTRAMUSCULAR | Status: DC | PRN

## 2016-02-10 NOTE — H&P (Signed)
[facility] History and Physical  Carmen [name] [id number] DOB: [birth year] DOA: 02/10/2016  Referring physician: ED physician PCP: [name], MD  Specialists: None listed  Chief Complaint:  Sore throat, malaise   HPI: Carmen [name] is a 25 y.o. female with PMH of sickle cell disease who presents to the ED with cough, sinus congestion, sore throat, and generalized body aches of 3 days duration. Patient endorses chills and malaise, but denies fevers. She denies any sick contacts or recent long distance travel. She denies dyspnea but notes that her cough is occasionally productive of thick yellow sputum. She denies chest pain, palpitations, abdominal pain, nausea, vomiting, or diarrhea. She denies dysuria or increased urinary urgency or frequency. There are no red, hot, swollen joints, and no wounds. She was reportedly seen for these complaints in the outpatient setting and was prescribed an empiric course of amoxicillin for presumed strep throat. She completed the course yesterday but has not experienced any relief in her symptoms. In fact, her aches, sore throat, and sinus congestion have continued to worsen. She has attempted to control her symptoms with Percocet at home but this is been unsuccessful.  In ED, patient was found to be afebrile, saturating well on room air, and with vital signs stable. EKG features a normal sinus rhythm with no acute ischemic findings. Chest x-ray is negative for acute cardiopulmonary disease. Urine is obtained for analysis and features few bacteria, large leukocytes, negative nitrites, and 6-30 WBC. There is too numerous to count squamous epithelial cells in the urine sample. CMP features a mildly elevated total bilirubin of 1.8, stable relative to her priors. CBC features a leukocytosis of 13,200, hemoglobin of 10.5, and thrombocytosis with platelet count of 538,000. The hemoglobin and platelet count appears stable relative to prior measurements.  Patient was treated with Dilaudid IV 2 mg 3, Benadryl, Toradol, and Zofran in the ED. She was given IV fluids with D5-1/2 NS, he remained hemodynamically stable, but with inadequate pain control, and will be admitted to the hospital for ongoing evaluation and management of acute URI and sickle cell disease with pain crisis.  Where does patient live?   At home    Can patient participate in ADLs?  Yes       Review of Systems:   General: no fevers, sweats, weight change, poor appetite, or fatigue. Chills, body aches HEENT: no blurry vision, hearing changes or sore throat Pulm: no dyspnea or wheeze. Cough occasionally productive of yellow sputum  CV: no chest pain or palpitations Abd: no nausea, vomiting, abdominal pain, diarrhea, or constipation GU: no dysuria, hematuria, increased urinary frequency, or urgency  Ext: no leg edema Neuro: no focal weakness, numbness, or tingling, no vision change or hearing loss Skin: no rash, no wounds MSK: No muscle spasm, no deformity, no red, hot, or swollen joint. Diffuse body aches Heme: No easy bruising or bleeding Travel history: No recent long distant travel    Allergy: No Known Allergies  Past Medical History  Diagnosis Date  . Sickle cell crisis ([place])   . Headache(784.0)   . Abortion     05/2012    Past Surgical History  Procedure Laterality Date  . Splenectomy    . Cholecystectomy N/A 11/30/2014    Procedure: LAPAROSCOPIC CHOLECYSTECTOMY SINGLE SITE WITH INTRAOPERATIVE CHOLANGIOGRAM;  Surgeon: Michael Boston, MD;  Location: [facility];  Service: General;  Laterality: N/A;    Social History:  reports that she has never smoked. She has never used smokeless tobacco. She  reports that she does not drink alcohol or use illicit drugs.  Family History:  Family History  Problem Relation Age of Onset  . Hypertension Mother   . Sickle cell anemia Sister   . Kidney disease Sister     Lupus  . Arthritis Sister   . Sickle cell anemia Sister   .  Sickle cell trait Sister   . Heart disease Maternal Aunt   . Heart disease Maternal Uncle      Prior to Admission medications   Medication Sig Start Date End Date Taking? Authorizing Provider  ibuprofen (ADVIL,MOTRIN) 800 MG tablet Take 800 mg by mouth 3 (three) times daily as needed for headache or moderate pain. pain 12/01/15  Yes Historical Provider, MD  oxyCODONE-acetaminophen (PERCOCET) 10-325 MG tablet Take 1 tablet by mouth every 6 (six) hours as needed. For pain 01/27/16  Yes Historical Provider, MD  amoxicillin (AMOXIL) 500 MG capsule Take 500 mg by mouth 3 (three) times daily. Reported on 02/10/2016    Historical Provider, MD    Physical Exam: Filed Vitals:   02/10/16 1730 02/10/16 1830 02/10/16 1944 02/10/16 1946  BP: 112/83 101/89 109/57   Pulse: 98 89  84  Temp:      TempSrc:      Resp: 15 20  15   Weight:      SpO2: 95% 91%  94%   General: Not in acute distress HEENT:       Eyes: PERRL, EOMI, no scleral icterus or conjunctival pallor.       ENT: No discharge from the ears or nose, no pharyngeal ulcers or exudate.        Neck: No JVD, no bruit, no appreciable mass Heme: No cervical adenopathy, no pallor Cardiac: S1/S2, RRR, No murmurs, No gallops or rubs. Pulm: Good air movement bilaterally. No rales, wheezing, rhonchi or rubs. Abd: Soft, nondistended, nontender, no rebound pain or gaurding, BS present. Ext: No LE edema bilaterally. 2+DP/PT pulse bilaterally. Musculoskeletal: No gross deformity, no red, hot, swollen joints Skin: No rashes or wounds on exposed surfaces  Neuro: Alert, oriented X3, cranial nerves II-XII grossly intact. No focal findings Psych: Patient is not overtly psychotic, appropriate mood and affect.  Labs on Admission:  Basic Metabolic Panel:  Recent Labs Lab 02/10/16 1243  NA 138  K 3.6  CL 110  CO2 19*  GLUCOSE 99  BUN 7  CREATININE 0.52  CALCIUM 9.6   Liver Function Tests:  Recent Labs Lab 02/10/16 1243  AST 23  ALT 19   ALKPHOS 54  BILITOT 1.8*  PROT 8.4*  ALBUMIN 4.8   No results for input(s): LIPASE, AMYLASE in the last 168 hours. No results for input(s): AMMONIA in the last 168 hours. CBC:  Recent Labs Lab 02/10/16 1243  WBC 13.2*  NEUTROABS 10.6*  HGB 10.5*  HCT 30.0*  MCV 84.5  PLT 538*   Cardiac Enzymes: No results for input(s): CKTOTAL, CKMB, CKMBINDEX, TROPONINI in the last 168 hours.  BNP (last 3 results) No results for input(s): BNP in the last 8760 hours.  ProBNP (last 3 results) No results for input(s): PROBNP in the last 8760 hours.  CBG: No results for input(s): GLUCAP in the last 168 hours.  Radiological Exams on Admission: Dg Chest 2 View  02/10/2016  CLINICAL DATA:  Cough, runny nose EXAM: CHEST  2 VIEW COMPARISON:  12/06/2015 FINDINGS: Cardiomediastinal silhouette is unremarkable. No acute infiltrate or pulmonary edema. Bony thorax is unremarkable. IMPRESSION: No active cardiopulmonary disease. Electronically Signed  By: Lahoma Crocker M.D.   On: 02/10/2016 13:07    EKG: Independently reviewed.  Abnormal findings:  Normal sinus rhythm  Assessment/Plan  1. Sickle cell pain crisis  - Likely precipitated by acute URI and associated dehydration  - No features suggestive of acute chest or serious bacterial infection  - Pain was not adequately controlled at home with Percocet 10 mg q6h, or in the ED with Dilaudid 2 mg IVP q1h x3 with Toradol  - Admit to telemetry to monitor QTc  - PCA ordered with 1.5 mg initial bolus, 0.5 mg patient-directed bolus, 10 min lockout, and 3 mg hourly max - Continue IVF hydration with D5-1/2 NS at 125 cc/hr, to be adjusted prn  - Return to home regimen when tolerated    2. Acute viral URI  - Cough, congestion, malaise, chills  - No pharyngeal exudates, petechiae, or ulcers  - Respiratory virus panel pending  - Droplet precautions for now  - Continue symptom management with pain-control, hydration    3. Anemia  - Hgb 10.5 on admission,  up from an apparent baseline of 8-9, likely d/t hemoconcentration  - No sign of active blood loss  - Reticulocyte response appropriate   4. Thrombocytosis  - Platelet count 538,000 on admission, stable relative to priors    5. Leukocytosis - WBC 13,200 on admission  - Likely secondary to acute URI; resp viral panel pending  - Remains afebrile, lactate reassuring; no evidence for bacterial infection    DVT ppx:  SQ Lovenox     Code Status: Full code Family Communication: None at bed side.              Disposition Plan: Admit to inpatient   Date of Service 02/10/2016    Vianne Bulls, MD [facility] Pager [phone]  If 7PM-7AM, please contact night-coverage [email or web address] Password [email or web address] 02/10/2016, 8:22 PM

## 2016-02-10 NOTE — ED Provider Notes (Addendum)
CSN: [id number]     Arrival date & time 02/10/16  1215 History   First MD Initiated Contact with Patient 02/10/16 1323     Chief Complaint  Patient presents with  . URI     (Consider location/radiation/quality/duration/timing/severity/associated sxs/prior Treatment) HPI Comments: Pain in ribs, throat, back started Saturday Cough, nonproductive most of the time, sometimes cough up yellow mucous No fevers Sore throat Back pain feels like pain crisis Percocet for pain, didn't help Dayquil for throat/cough   Patient is a 25 y.o. female presenting with URI.  URI Presenting symptoms: congestion, cough and sore throat   Presenting symptoms: no fever   Associated symptoms: arthralgias (legs)   Associated symptoms: no headaches and no neck pain   +sick contacts  Past Medical History  Diagnosis Date  . Sickle cell crisis ([place])   . Headache(784.0)   . Abortion     05/2012   Past Surgical History  Procedure Laterality Date  . Splenectomy    . Cholecystectomy N/A 11/30/2014    Procedure: LAPAROSCOPIC CHOLECYSTECTOMY SINGLE SITE WITH INTRAOPERATIVE CHOLANGIOGRAM;  Surgeon: Michael Boston, MD;  Location: [facility];  Service: General;  Laterality: N/A;   Family History  Problem Relation Age of Onset  . Hypertension Mother   . Sickle cell anemia Sister   . Kidney disease Sister     Lupus  . Arthritis Sister   . Sickle cell anemia Sister   . Sickle cell trait Sister   . Heart disease Maternal Aunt   . Heart disease Maternal Uncle    Social History  Substance Use Topics  . Smoking status: Never Smoker   . Smokeless tobacco: Never Used  . Alcohol Use: No   OB History    Gravida Para Term Preterm AB TAB SAB Ectopic Multiple Living   1    1          Review of Systems  Constitutional: Negative for fever.  HENT: Positive for congestion and sore throat.   Eyes: Negative for visual disturbance.  Respiratory: Positive for cough and shortness of breath.   Cardiovascular: Negative for  chest pain.  Gastrointestinal: Negative for nausea, vomiting and abdominal pain.  Genitourinary: Negative for dysuria and difficulty urinating.  Musculoskeletal: Positive for back pain and arthralgias (legs). Negative for neck pain.  Skin: Negative for rash.  Neurological: Negative for syncope and headaches.      Allergies  Review of patient's allergies indicates no known allergies.  Home Medications   Prior to Admission medications   Medication Sig Start Date End Date Taking? Authorizing Provider  ibuprofen (ADVIL,MOTRIN) 800 MG tablet Take 800 mg by mouth 3 (three) times daily as needed for headache or moderate pain. pain 12/01/15  Yes Historical Provider, MD  oxyCODONE-acetaminophen (PERCOCET) 10-325 MG tablet Take 1 tablet by mouth every 6 (six) hours as needed. For pain 01/27/16  Yes Historical Provider, MD  amoxicillin (AMOXIL) 500 MG capsule Take 500 mg by mouth 3 (three) times daily. Reported on 02/10/2016    Historical Provider, MD   BP 128/78 mmHg  Pulse 110  Temp(Src) 99.2 F (37.3 C) (Oral)  Resp 18  Wt 177 lb 15.6 oz (80.73 kg)  SpO2 100%  LMP 01/20/2016 Physical Exam  Constitutional: She is oriented to person, place, and time. She appears well-developed and well-nourished. No distress.  HENT:  Head: Normocephalic and atraumatic.  Mouth/Throat: Oropharyngeal exudate: no exudates.  Eyes: Conjunctivae and EOM are normal. Pupils are equal, round, and reactive to light.  Neck:  Normal range of motion.  Cardiovascular: Regular rhythm, normal heart sounds and intact distal pulses.  Tachycardia present.  Exam reveals no gallop and no friction rub.   No murmur heard. Pulmonary/Chest: Effort normal and breath sounds normal. No respiratory distress. She has no wheezes. She has no rales.  Abdominal: Soft. She exhibits no distension. There is no tenderness. There is no guarding.  Musculoskeletal: She exhibits no edema or tenderness.  Neurological: She is alert and oriented to  person, place, and time.  Skin: Skin is warm and dry. No rash noted. She is not diaphoretic. No erythema.  Nursing note and vitals reviewed.   ED Course  Procedures (including critical care time) Labs Review Labs Reviewed  COMPREHENSIVE METABOLIC PANEL - Abnormal; Notable for the following:    CO2 19 (*)    Total Protein 8.4 (*)    Total Bilirubin 1.8 (*)    All other components within normal limits  CBC WITH DIFFERENTIAL/PLATELET - Abnormal; Notable for the following:    WBC 13.2 (*)    RBC 3.55 (*)    Hemoglobin 10.5 (*)    HCT 30.0 (*)    RDW 15.9 (*)    Platelets 538 (*)    Neutro Abs 10.6 (*)    Monocytes Absolute 1.3 (*)    All other components within normal limits  RETICULOCYTES - Abnormal; Notable for the following:    Retic Ct Pct 6.5 (*)    RBC. 3.50 (*)    Retic Count, Manual 227.5 (*)    All other components within normal limits    Imaging Review Dg Chest 2 View  02/10/2016  CLINICAL DATA:  Cough, runny nose EXAM: CHEST  2 VIEW COMPARISON:  12/06/2015 FINDINGS: Cardiomediastinal silhouette is unremarkable. No acute infiltrate or pulmonary edema. Bony thorax is unremarkable. IMPRESSION: No active cardiopulmonary disease. Electronically Signed   By: Lahoma Crocker M.D.   On: 02/10/2016 13:07   I have personally reviewed and evaluated these images and lab results as part of my medical decision-making.   EKG Interpretation   Date/Time:  Tuesday February 10 2016 15:24:24 EDT Ventricular Rate:  98 PR Interval:  150 QRS Duration: 84 QT Interval:  341 QTC Calculation: 435 R Axis:   124 Text Interpretation:  Possible Right and left arm electrode reversal,  interpretation assumes no reversal Sinus rhythm Right axis deviation  Abnormal T, consider ischemia, lateral leads T wave changes in lateral  leads new from prior Confirmed by [facility] MD, Jaelani Posa ([id number]) on 02/10/2016  5:14:33 PM      MDM   Final diagnoses:  Sickle-cell crisis ([place])  URI (upper respiratory  infection)   25 year old female with a history of sickle cell disease presents with concern for cough, nasal congestion, sore throat, body aches. Patient has had no fever, and afebrile on arrival to the emergency department, and doubt bacteremia/sepsis. Chest x-ray shows no signs of pneumonia, doubt acute chest syndrome. No pain in one particular joint or back to suggest septic arthritis. No exudate, no fever, and presence of cough and doubt strep throat. Given significant cough, nasal congestion, have low suspicion that symptoms represent PE or ACS. EKG may have lead reversal, however shows a sinus rhythm with nonspecific changes. Repeat ECG ordered. Repeat ECG shows normal p waves, normal ECG without acute changes and tech confirms presence of lead reversal prior.  Patient with likely viral URI causing a sickle cell pain crisis. She has just received her second dose of 2 mg Dilaudid and continued  to have pain. Third dose of dilaudid ordered.  D5 1/2 normal infusion continues.  Care transferred to Dr. Johnney Killian with reevaluation of pain pending.   Gareth Morgan, MD 02/10/16 1723  Gareth Morgan, MD 02/10/16 1750

## 2016-02-10 NOTE — ED Notes (Signed)
Per pt, states body aches, cough, congestion-history of sickle cell although does not know if this is a crisis

## 2016-02-10 NOTE — ED Notes (Signed)
Patient crying while talking on the phone with significant other.

## 2016-02-10 NOTE — ED Notes (Signed)
Pt ambulated to restroom. 

## 2016-02-10 NOTE — ED Notes (Signed)
Writer was in the room next to the patient's room #10 and heard screaming. Writer went next door and found patient screaming at her significant other and sobbing. Informed the patient that she could not be doing that in the Ed and patient immediately asked for her pain med while screaming and crying at her boyfriend.

## 2016-02-11 DIAGNOSIS — J101 Influenza due to other identified influenza virus with other respiratory manifestations: Secondary | ICD-10-CM | POA: Diagnosis present

## 2016-02-11 DIAGNOSIS — D72829 Elevated white blood cell count, unspecified: Secondary | ICD-10-CM | POA: Diagnosis not present

## 2016-02-11 DIAGNOSIS — J09X2 Influenza due to identified novel influenza A virus with other respiratory manifestations: Secondary | ICD-10-CM | POA: Diagnosis not present

## 2016-02-11 DIAGNOSIS — E860 Dehydration: Secondary | ICD-10-CM | POA: Diagnosis present

## 2016-02-11 DIAGNOSIS — D7589 Other specified diseases of blood and blood-forming organs: Secondary | ICD-10-CM | POA: Diagnosis present

## 2016-02-11 DIAGNOSIS — J069 Acute upper respiratory infection, unspecified: Secondary | ICD-10-CM | POA: Diagnosis not present

## 2016-02-11 DIAGNOSIS — D638 Anemia in other chronic diseases classified elsewhere: Secondary | ICD-10-CM | POA: Diagnosis present

## 2016-02-11 DIAGNOSIS — Z9081 Acquired absence of spleen: Secondary | ICD-10-CM | POA: Diagnosis not present

## 2016-02-11 DIAGNOSIS — D5700 Hb-SS disease with crisis, unspecified: Secondary | ICD-10-CM | POA: Diagnosis present

## 2016-02-11 DIAGNOSIS — Z9049 Acquired absence of other specified parts of digestive tract: Secondary | ICD-10-CM | POA: Diagnosis not present

## 2016-02-11 LAB — INFLUENZA PANEL BY PCR (TYPE A & B)
H1N1 flu by pcr: NOT DETECTED
Influenza A By PCR: POSITIVE — AB
Influenza B By PCR: NEGATIVE

## 2016-02-11 LAB — EXPECTORATED SPUTUM ASSESSMENT W REFEX TO RESP CULTURE: SPECIAL REQUESTS: NORMAL

## 2016-02-11 LAB — EXPECTORATED SPUTUM ASSESSMENT W GRAM STAIN, RFLX TO RESP C

## 2016-02-11 MED ORDER — HYDROMORPHONE HCL 2 MG/ML IJ SOLN: 2.0000 mg | Freq: Once | INTRAMUSCULAR | Status: DC

## 2016-02-11 MED ORDER — SODIUM CHLORIDE 0.9 % IV SOLN: 25.0000 mg | INTRAVENOUS | Status: DC | PRN

## 2016-02-11 MED ORDER — OSELTAMIVIR PHOSPHATE 75 MG PO CAPS
75.0000 mg | ORAL_CAPSULE | Freq: Two times a day (BID) | ORAL | Status: DC
  Administered 2016-02-12 – 2016-02-15 (×8): 75 mg via ORAL
  Filled 2016-02-11 (×10): qty 1

## 2016-02-11 MED ORDER — ACETAMINOPHEN 325 MG PO TABS
650.0000 mg | ORAL_TABLET | ORAL | Status: DC | PRN
  Administered 2016-02-11: 650 mg via ORAL
  Filled 2016-02-11: qty 2

## 2016-02-11 MED ORDER — OXYCODONE-ACETAMINOPHEN 5-325 MG PO TABS
2.0000 | ORAL_TABLET | ORAL | Status: DC | PRN
  Administered 2016-02-12 (×2): 2 via ORAL
  Filled 2016-02-11 (×3): qty 2

## 2016-02-11 MED ORDER — DIPHENHYDRAMINE HCL 25 MG PO CAPS
25.0000 mg | ORAL_CAPSULE | ORAL | Status: DC | PRN
  Administered 2016-02-12 – 2016-02-15 (×3): 25 mg via ORAL
  Filled 2016-02-11 (×3): qty 1

## 2016-02-11 MED ORDER — HYDROMORPHONE HCL 2 MG/ML IJ SOLN: 2.0000 mg | INTRAMUSCULAR | Status: DC | PRN

## 2016-02-11 NOTE — Progress Notes (Signed)
Pt tempeture 102.3. MD notified. New orders placed in Epic by MD. Will follow new orders out and continue to monitor.

## 2016-02-11 NOTE — Progress Notes (Signed)
SICKLE CELL SERVICE PROGRESS NOTE  Carmen [name] [id number] DOB: 1991-05-26 DOA: 02/10/2016 PCP: [name], MD  Assessment/Plan: Principal Problem:   Hb-SS disease with crisis [facility]) Active Problems:   Sickle cell pain crisis ([place])   Anemia   Leukocytosis   Thrombocytosis ([place])   Acute URI  1. Upper Respiratory Infection: Pt has symptoms of viral illness. However she has significant production of sputum. Will continue treat supportively and obtain sputum culture. Monitor for fevers.  2. Hb SS with crisis: Continue PCA and add clinician assisited doses on a PRN basis. Continue Toradol and IVF.  3. Leukocytosis: Improved after hydration. Likely related to URI and crisis.  4. Anemia of Chronic Disease: Hb above baseline. Will re-check tomorrow.     Code Status: Full Code Family Communication: N/A Disposition Plan: Not yet ready for discharge  [place].  Pager [phone]. If 7PM-7AM, please contact night-coverage.  02/11/2016, 3:11 PM    Interim History: Pt reports that she has had symptoms for about 5 days. She did not seek any medical attention for these new symptoms and that the reported use of antibiotics was remote and related to a tooth abscess about 2 months ago. She reports that she had tactile fevers at home, and has bee using OTC medication for symptomatic treatment. She continues to have productive cough.  With regard to her sickle cell crisis her pain is localized to the back and legs. She reports that it is improved since last night but still significant. She has not had a BM in 4 days and is also having difficulty urinating and feels that she has to "push" to urinate. She also offered that she is almost homeless were it not for there boyfriend.    Consultants:  None  Procedures:  None  Antibiotics:  None   Objective: Filed Vitals:   02/11/16 0800 02/11/16 1200 02/11/16 1220 02/11/16 1506  BP:      Pulse:      Temp:      TempSrc:       Resp: 16 18 16 22   Height:      Weight:      SpO2: 97% 98% 95% 98%   Weight change:   Intake/Output Summary (Last 24 hours) at 02/11/16 1511 Last data filed at 02/11/16 1459  Gross per 24 hour  Intake 3844.17 ml  Output      0 ml  Net 3844.17 ml    General: Alert, awake, oriented x3, in no acute distress.  HEENT: /AT PEERL, EOMI Neck: Trachea midline,  no masses, no thyromegal,y no JVD, no carotid bruit OROPHARYNX:  Moist, No exudate/ erythema/lesions.  Heart: Regular rate and rhythm, without murmurs, rubs, gallops, PMI non-displaced, no heaves or thrills on palpation.  Lungs: Clear to auscultation, no wheezing or rhonchi noted. No increased vocal fremitus resonant to percussion  Abdomen: Soft, nontender, nondistended, positive bowel sounds, no masses no hepatosplenomegaly noted.  Neuro: No focal neurological deficits noted cranial nerves II through XII grossly intact.  Strength 5/5 in bilateral upper and lower extremities. Musculoskeletal: No warmth swelling or erythema around joints, no spinal tenderness noted. Psychiatric: Patient alert and oriented x3, good insight and cognition, good recent to remote recall.    Data Reviewed: Basic Metabolic Panel:  Recent Labs Lab 02/10/16 1243 02/10/16 2025  NA 138  --   K 3.6  --   CL 110  --   CO2 19*  --   GLUCOSE 99  --   BUN 7  --  CREATININE 0.52  --   CALCIUM 9.6  --   MG  --  1.7   Liver Function Tests:  Recent Labs Lab 02/10/16 1243  AST 23  ALT 19  ALKPHOS 54  BILITOT 1.8*  PROT 8.4*  ALBUMIN 4.8   No results for input(s): LIPASE, AMYLASE in the last 168 hours. No results for input(s): AMMONIA in the last 168 hours. CBC:  Recent Labs Lab 02/10/16 1243  WBC 13.2*  NEUTROABS 10.6*  HGB 10.5*  HCT 30.0*  MCV 84.5  PLT 538*   Cardiac Enzymes: No results for input(s): CKTOTAL, CKMB, CKMBINDEX, TROPONINI in the last 168 hours. BNP (last 3 results) No results for input(s): BNP in the last 8760  hours.  ProBNP (last 3 results) No results for input(s): PROBNP in the last 8760 hours.  CBG: No results for input(s): GLUCAP in the last 168 hours.  No results found for this or any previous visit (from the past 240 hour(s)).   Studies: Dg Chest 2 View  02/10/2016  CLINICAL DATA:  Cough, runny nose EXAM: CHEST  2 VIEW COMPARISON:  12/06/2015 FINDINGS: Cardiomediastinal silhouette is unremarkable. No acute infiltrate or pulmonary edema. Bony thorax is unremarkable. IMPRESSION: No active cardiopulmonary disease. Electronically Signed   By: Lahoma Crocker M.D.   On: 02/10/2016 13:07    Scheduled Meds: . enoxaparin (LOVENOX) injection  40 mg Subcutaneous Q24H  . folic acid  1 mg Oral Daily  . guaiFENesin  1,200 mg Oral BID  . hydrocortisone cream   Topical BID  . HYDROmorphone   Intravenous 6 times per day  . senna-docusate  1 tablet Oral BID   Continuous Infusions: . dextrose 5 % and 0.45% NaCl 125 mL/hr at 02/11/16 1504    Time spent 30 miunutes

## 2016-02-11 NOTE — Progress Notes (Signed)
Initial Nutrition Assessment  DOCUMENTATION CODES:   Obesity unspecified  INTERVENTION:  - RD will monitor for nutrition-related needs at follow-up - Continue Regular diet  NUTRITION DIAGNOSIS:   Inadequate oral intake related to acute illness as evidenced by per patient/family report.  GOAL:   Patient will meet greater than or equal to 90% of their needs  MONITOR:   PO intake, Weight trends, Labs, I & O's  REASON FOR ASSESSMENT:   Malnutrition Screening Tool  ASSESSMENT:   25 y.o. female with PMH of sickle cell disease who presents to the ED with cough, sinus congestion, sore throat, and generalized body aches of 3 days duration. Patient endorses chills and malaise, but denies fevers. She denies any sick contacts or recent long distance travel. She denies dyspnea but notes that her cough is occasionally productive of thick yellow sputum. She denies chest pain, palpitations, abdominal pain, nausea, vomiting, or diarrhea. She denies dysuria or increased urinary urgency or frequency. There are no red, hot, swollen joints, and no wounds. She was reportedly seen for these complaints in the outpatient setting and was prescribed an empiric course of amoxicillin for presumed strep throat. She completed the course yesterday but has not experienced any relief in her symptoms. In fact, her aches, sore throat, and sinus congestion have continued to worsen. She has attempted to control her symptoms with Percocet at home but this is been unsuccessful.  Pt seen for MST. BMI indicates obesity. No intakes documented since admission. Pt slightly agitated this AM with continual itching during time of RD visit. Spoke with pt's boyfriend who was at bedside. He states that itching has been ongoing since admission. He is unsure of intakes PTA but states that pt had one episode of emesis this AM and after this occurrence she consumed orange juice, water, and chicken broth. Boyfriend states that this was the  only episode of emesis and that pt did not have any episodes PTA.  No muscle or fat wasting noted during physical assessment. Per chart review, pt has lost 11 lbs (6% body weight) in the past 2 months which is not significant for time frame. Pt not meeting needs this AM. Will continue to monitor for needs. Medications reviewed. IVF: D5-1/2 NS @ 125 mL/hr (510 kcal). Labs reviewed.   Diet Order:  Diet regular Room service appropriate?: Yes; Fluid consistency:: Thin  Skin:  Reviewed, no issues  Last BM:  4/11  Height:   Ht Readings from Last 1 Encounters:  02/10/16 5\' 2"  (1.575 m)    Weight:   Wt Readings from Last 1 Encounters:  02/10/16 176 lb 2.4 oz (79.9 kg)    Ideal Body Weight:  50 kg (kg)  BMI:  Body mass index is 32.21 kg/(m^2).  Estimated Nutritional Needs:   Kcal:  1600-1800  Protein:  70-80 grams  Fluid:  2 L/day  EDUCATION NEEDS:   No education needs identified at this time     Jarome Matin, RD, LDN Inpatient Clinical Dietitian Pager # [phone] After hours/weekend pager # [phone]

## 2016-02-12 DIAGNOSIS — J09X2 Influenza due to identified novel influenza A virus with other respiratory manifestations: Secondary | ICD-10-CM

## 2016-02-12 LAB — CBC WITH DIFFERENTIAL/PLATELET
BASOS ABS: 0 10*3/uL (ref 0.0–0.1)
BASOS PCT: 0 %
EOS ABS: 0.1 10*3/uL (ref 0.0–0.7)
Eosinophils Relative: 1 %
HCT: 21.9 % — ABNORMAL LOW (ref 36.0–46.0)
HEMOGLOBIN: 8.3 g/dL — AB (ref 12.0–15.0)
LYMPHS ABS: 5 10*3/uL — AB (ref 0.7–4.0)
LYMPHS PCT: 37 %
MCH: 30.3 pg (ref 26.0–34.0)
MCHC: 37.9 g/dL — AB (ref 30.0–36.0)
MCV: 79.9 fL (ref 78.0–100.0)
Monocytes Absolute: 2.2 10*3/uL — ABNORMAL HIGH (ref 0.1–1.0)
Monocytes Relative: 16 %
NEUTROS ABS: 6.3 10*3/uL (ref 1.7–7.7)
Neutrophils Relative %: 46 %
Platelets: 346 10*3/uL (ref 150–400)
RBC: 2.74 MIL/uL — ABNORMAL LOW (ref 3.87–5.11)
RDW: 18.4 % — AB (ref 11.5–15.5)
WBC: 13.6 10*3/uL — ABNORMAL HIGH (ref 4.0–10.5)

## 2016-02-12 LAB — RESPIRATORY VIRUS PANEL
Adenovirus: NEGATIVE
INFLUENZA A: POSITIVE — AB
Influenza B: NEGATIVE
METAPNEUMOVIRUS: NEGATIVE
PARAINFLUENZA 2 A: NEGATIVE
PARAINFLUENZA 3 A: NEGATIVE
Parainfluenza 1: NEGATIVE
RESPIRATORY SYNCYTIAL VIRUS A: NEGATIVE
RHINOVIRUS: NEGATIVE
Respiratory Syncytial Virus B: NEGATIVE

## 2016-02-12 MED ORDER — HYDROMORPHONE 1 MG/ML IV SOLN
INTRAVENOUS | Status: DC
  Administered 2016-02-12: 18:00:00 via INTRAVENOUS
  Administered 2016-02-13: 2 mg via INTRAVENOUS
  Administered 2016-02-13: 6.5 mg via INTRAVENOUS
  Administered 2016-02-13: 7.5 mg via INTRAVENOUS
  Administered 2016-02-13: 3 mg via INTRAVENOUS
  Administered 2016-02-13: 7 mg via INTRAVENOUS
  Administered 2016-02-13: 8 mg via INTRAVENOUS
  Administered 2016-02-14 (×2): 3.5 mg via INTRAVENOUS
  Administered 2016-02-14: 2.5 mg via INTRAVENOUS
  Administered 2016-02-14: 3.5 mg via INTRAVENOUS
  Administered 2016-02-14: 3 mL via INTRAVENOUS
  Administered 2016-02-14: 4.5 mg via INTRAVENOUS
  Administered 2016-02-15: 8.5 mg via INTRAVENOUS
  Administered 2016-02-15: 0.5 mg via INTRAVENOUS
  Administered 2016-02-15: 2 mg via INTRAVENOUS
  Filled 2016-02-12 (×4): qty 25

## 2016-02-12 MED ORDER — ONDANSETRON HCL 4 MG/2ML IJ SOLN
4.0000 mg | Freq: Four times a day (QID) | INTRAMUSCULAR | Status: DC | PRN
  Administered 2016-02-14: 4 mg via INTRAVENOUS
  Filled 2016-02-12 (×2): qty 2

## 2016-02-12 MED ORDER — HYDROMORPHONE HCL 2 MG/ML IJ SOLN: 2.0000 mg | INTRAMUSCULAR | Status: DC | PRN

## 2016-02-12 MED ORDER — HYDROMORPHONE HCL 2 MG/ML IJ SOLN
2.0000 mg | INTRAMUSCULAR | Status: DC | PRN
  Administered 2016-02-12 (×2): 2 mg via INTRAMUSCULAR
  Filled 2016-02-12 (×2): qty 1

## 2016-02-12 MED ORDER — NALOXONE HCL 0.4 MG/ML IJ SOLN: 0.4000 mg | INTRAMUSCULAR | Status: DC | PRN

## 2016-02-12 MED ORDER — SODIUM CHLORIDE 0.9% FLUSH
10.0000 mL | INTRAVENOUS | Status: DC | PRN
  Administered 2016-02-13 – 2016-02-14 (×2): 10 mL
  Filled 2016-02-12 (×2): qty 40

## 2016-02-12 MED ORDER — SODIUM CHLORIDE 0.9% FLUSH: 9.0000 mL | INTRAVENOUS | Status: DC | PRN

## 2016-02-12 MED ORDER — HYDROMORPHONE HCL 2 MG/ML IJ SOLN
2.0000 mg | INTRAMUSCULAR | Status: DC | PRN
  Administered 2016-02-12: 2 mg via SUBCUTANEOUS
  Filled 2016-02-12: qty 1

## 2016-02-12 MED ORDER — HYDROMORPHONE HCL 2 MG/ML IJ SOLN
2.0000 mg | Freq: Once | INTRAMUSCULAR | Status: AC
  Administered 2016-02-12: 2 mg via INTRAMUSCULAR
  Filled 2016-02-12: qty 1

## 2016-02-12 NOTE — Progress Notes (Signed)
Dear Doctor: Carmen [name] This patient has been identified as a candidate for PICC for the following reason (s): poor veins/poor circulatory system (CHF, COPD, emphysema, diabetes, steroid use, IV drug abuse, etc.) If you agree, please write an order for the indicated device. For any questions contact the Vascular Access Team at [phone] if no answer, please leave a message.  Thank you for supporting the early vascular access assessment program.

## 2016-02-12 NOTE — Progress Notes (Signed)
Peripherally Inserted Central Catheter/Midline Placement  The IV Nurse has discussed with the patient and/or persons authorized to consent for the patient, the purpose of this procedure and the potential benefits and risks involved with this procedure.  The benefits include less needle sticks, lab draws from the catheter and patient may be discharged home with the catheter.  Risks include, but not limited to, infection, bleeding, blood clot (thrombus formation), and puncture of an artery; nerve damage and irregular heat beat.  Alternatives to this procedure were also discussed.  PICC/Midline Placement Documentation        Darlyn Read 02/12/2016, 5:30 PM

## 2016-02-12 NOTE — Progress Notes (Signed)
SICKLE CELL SERVICE PROGRESS NOTE  Carmen [name] [id number] DOB: [birth year] DOA: 02/10/2016 PCP: [name], MD  Assessment/Plan: Principal Problem:   Hb-SS disease with crisis [facility]) Active Problems:   Sickle cell pain crisis ([place])   Anemia   Leukocytosis   Thrombocytosis ([place])   Acute URI  1. Influenza A/Upper Respiratory Infection: Pt positive for Influenza A. Continue supportive care.Tamiflu not indicated as patient presented more than 4 days after onset of symptoms.  2. Hb SS with crisis: Pt has lost IV Access. Awaiting PICC line placement. Will schedule oral analgesics and treat breakthrough pain with Dilaudid by sub-cutaneous route every 2 hours.  3. Leukocytosis: Improved after hydration. Likely related to URI and crisis.  4. Anemia of Chronic Disease: Hb at baseline after hydration.    Code Status: Full Code Family Communication: N/A Disposition Plan: Not yet ready for discharge  [place].  Pager [phone]. If 7PM-7AM, please contact night-coverage.  02/12/2016, 3:51 PM  LOS: 1 day   Interim History: Pt reports that she has had symptoms for about 5 days. She did not seek any medical attention for these new symptoms and that the reported use of antibiotics was remote and related to a tooth abscess about 2 months ago. She reports that she had tactile fevers at home, and has bee using OTC medication for symptomatic treatment. She continues to have productive cough.  With regard to her sickle cell crisis her pain is localized to the back and legs. She reports that it is improved since last night but still significant. She has not had a BM in 4 days and is also having difficulty urinating and feels that she has to "push" to urinate. She also offered that she is almost homeless were it not for there boyfriend.    Consultants:  None  Procedures:  None  Antibiotics:  None   Objective: Filed Vitals:   02/11/16 1950 02/11/16 2140 02/12/16 0626 02/12/16  1445  BP:  106/79 116/77 94/81  Pulse:  73 110 80  Temp:  98.2 F (36.8 C) 99.1 F (37.3 C) 98.4 F (36.9 C)  TempSrc:  Oral Oral Oral  Resp: 21 12 20 18   Height:      Weight:      SpO2: 95% 95% 95% 96%   Weight change:   Intake/Output Summary (Last 24 hours) at 02/12/16 1551 Last data filed at 02/12/16 0700  Gross per 24 hour  Intake   1840 ml  Output      0 ml  Net   1840 ml    General: Alert, awake, oriented x3, in no acute distress.  HEENT: [place]/AT PEERL, EOMI, anicteric. Neck: Trachea midline,  no masses, no thyromegal,y no JVD, no carotid bruit OROPHARYNX:  Moist, No exudate/ erythema/lesions.  Heart: Regular rate and rhythm, without murmurs, rubs, gallops, PMI non-displaced, no heaves or thrills on palpation.  Lungs: Clear to auscultation, no wheezing or rhonchi noted. No increased vocal fremitus resonant to percussion  Abdomen: Soft, nontender, nondistended, positive bowel sounds, no masses no hepatosplenomegaly noted.  Neuro: No focal neurological deficits noted cranial nerves II through XII grossly intact.  Strength 5/5 in bilateral upper and lower extremities. Musculoskeletal: No warmth swelling or erythema around joints, no spinal tenderness noted. Psychiatric: Patient alert and oriented x3, good insight and cognition, good recent to remote recall.    Data Reviewed: Basic Metabolic Panel:  Recent Labs Lab 02/10/16 1243 02/10/16 2025  NA 138  --   K 3.6  --  CL 110  --   CO2 19*  --   GLUCOSE 99  --   BUN 7  --   CREATININE 0.52  --   CALCIUM 9.6  --   MG  --  1.7   Liver Function Tests:  Recent Labs Lab 02/10/16 1243  AST 23  ALT 19  ALKPHOS 54  BILITOT 1.8*  PROT 8.4*  ALBUMIN 4.8   No results for input(s): LIPASE, AMYLASE in the last 168 hours. No results for input(s): AMMONIA in the last 168 hours. CBC:  Recent Labs Lab 02/10/16 1243 02/12/16 0518  WBC 13.2* 13.6*  NEUTROABS 10.6* 6.3  HGB 10.5* 8.3*  HCT 30.0* 21.9*  MCV 84.5  79.9  PLT 538* 346   Cardiac Enzymes: No results for input(s): CKTOTAL, CKMB, CKMBINDEX, TROPONINI in the last 168 hours. BNP (last 3 results) No results for input(s): BNP in the last 8760 hours.  ProBNP (last 3 results) No results for input(s): PROBNP in the last 8760 hours.  CBG: No results for input(s): GLUCAP in the last 168 hours.  Recent Results (from the past 240 hour(s))  Culture, expectorated sputum-assessment     Status: None   Collection Time: 02/11/16  3:09 PM  Result Value Ref Range Status   Specimen Description SPU  Final   Special Requests Normal  Final   Sputum evaluation   Final    THIS SPECIMEN IS ACCEPTABLE. RESPIRATORY CULTURE REPORT TO FOLLOW.   Report Status 02/11/2016 FINAL  Final  Culture, respiratory (NON-Expectorated)     Status: None (Preliminary result)   Collection Time: 02/11/16  3:09 PM  Result Value Ref Range Status   Specimen Description SPUTUM  Final   Special Requests NONE  Final   Gram Stain   Final    ABUNDANT WBC PRESENT,BOTH PMN AND MONONUCLEAR ABUNDANT SQUAMOUS EPITHELIAL CELLS PRESENT ABUNDANT GRAM POSITIVE COCCI IN PAIRS IN CHAINS IN CLUSTERS ABUNDANT GRAM POSITIVE RODS ABUNDANT GRAM NEGATIVE RODS Performed at [facility]    Culture PENDING  Incomplete   Report Status PENDING  Incomplete  Culture, blood (Routine X 2) w Reflex to ID Panel     Status: None (Preliminary result)   Collection Time: 02/11/16  5:15 PM  Result Value Ref Range Status   Specimen Description BLOOD LEFT ARM  Final   Special Requests BOTTLES DRAWN AEROBIC AND ANAEROBIC 6CC  Final   Culture   Final    NO GROWTH < 24 HOURS Performed at [facility]    Report Status PENDING  Incomplete  Culture, blood (Routine X 2) w Reflex to ID Panel     Status: None (Preliminary result)   Collection Time: 02/11/16  5:39 PM  Result Value Ref Range Status   Specimen Description BLOOD LEFT HAND  Final   Special Requests BOTTLES DRAWN AEROBIC ONLY 4CC  Final    Culture   Final    NO GROWTH < 24 HOURS Performed at [facility]    Report Status PENDING  Incomplete     Studies: Dg Chest 2 View  02/10/2016  CLINICAL DATA:  Cough, runny nose EXAM: CHEST  2 VIEW COMPARISON:  12/06/2015 FINDINGS: Cardiomediastinal silhouette is unremarkable. No acute infiltrate or pulmonary edema. Bony thorax is unremarkable. IMPRESSION: No active cardiopulmonary disease. Electronically Signed   By: Lahoma Crocker M.D.   On: 02/10/2016 13:07    Scheduled Meds: . enoxaparin (LOVENOX) injection  40 mg Subcutaneous Q24H  . folic acid  1 mg Oral  Daily  . guaiFENesin  1,200 mg Oral BID  . hydrocortisone cream   Topical BID  . oseltamivir  75 mg Oral BID  . senna-docusate  1 tablet Oral BID   Continuous Infusions: . dextrose 5 % and 0.45% NaCl Stopped (02/11/16 2259)    Time spent 25 miunutes

## 2016-02-12 NOTE — Progress Notes (Signed)
Since the beginning of shift, pt has been crying and yelling constantly with boyfriend over the phone. Boyfriend came to floor about 2100 to get his things and then left. Before leaving he said to RN, "I'm so sorry for all of this, but I can't deal with her anymore. She's going to need yall's help because I just can't do it anymore." RN responded with ok. After boyfriend left, pt was crying extremely loud in room, RN went to go check on pt and she was just distraught. Pt then walked out of her room and was trying to get on the elevator to go after said boyfriend. But RN advised her that she wasn't allowed to leave the floor. Pt then stated that she wanted to leave the hospital all together. RN and Agricultural consultant let the pt know that leaving against medical advice would mean that she would have to pay for her bills and that insurance wouldn't cover anything. That once she left, she can't come back to her room after signing the [place] form. Pt then decided to change her mind and wanted to stay. About 10 minutes went by and pt called her RN to come back into the room. Pt has now decided for sure that she wants to leave and has a ride coming to pick her up. RN paged on-call MD Rogue Bussing about pt's decision, got patient to sign AMA form at 2138, and paged IV team to come disconnect her PICC line.  9:48 PM Carmela Hurt, RN 02/12/2016

## 2016-02-13 DIAGNOSIS — J069 Acute upper respiratory infection, unspecified: Secondary | ICD-10-CM

## 2016-02-13 MED ORDER — HYDROMORPHONE HCL 1 MG/ML IJ SOLN
1.0000 mg | INTRAMUSCULAR | Status: DC | PRN
  Administered 2016-02-14 – 2016-02-15 (×4): 1 mg via INTRAVENOUS
  Filled 2016-02-13 (×5): qty 1

## 2016-02-13 MED ORDER — ONDANSETRON HCL 4 MG/2ML IJ SOLN
4.0000 mg | Freq: Once | INTRAMUSCULAR | Status: AC
  Administered 2016-02-13: 4 mg via INTRAVENOUS

## 2016-02-13 NOTE — Progress Notes (Signed)
Pt and boyfriend have fought the entire night. They have been screaming, cussing, and crying at each other all night. RN and Agricultural consultant told them numerous times to be quiet and keep it down because other patient's around them were trying to sleep. Pt said that she "can't help that she has a loud voice." Pt and boyfriend continued to argue. This morning around 0600 RN was sitting closest to pt room charting and smelled cigarette smoke. The smell was coming straight from pt room. Agricultural consultant and pt RN knocked on the door and the room smelled very strong of cigarette smoke. We politely asked if they were smoking? Since it smelled as if they were and they were sitting at the window with it open. When we asked patient got offended and said that she "didn't appreciate [place] grouping up on her all night tell them to keep it down and that they were loud and then trying to accuse her of smoking." RN responded, "I didn't come in here to accuse you, I've been nice to you all night. I'm only saying that it didn't smell like smoke before and it does not and it's coming from this room." Pt then said, "I've been to this hospital multiple times, I think I know the rules! I don't smoke tobacco." RN then went on to say, "I never said that you did, i'm just letting you know that it's not allowed, along with the window being open. You can't do that on the floor, i'm sorry." Pt seemed to be very aggravated in the morning and was just talking very disrespectful. Charge RN also thought I should write a note about this patient's behavior during the night.  Carmela Hurt, RN 02/13/2016 7:43 AM
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
## 2016-02-13 NOTE — Progress Notes (Signed)
IV team nurse arrived to floor to remove PICC line for this pt that wanted to leave AMA. However once the IV team RN entered pt room, pt decided that she changed her mind again. IV team RN immediately came and found me to let me know that the patient stated that she "wanted to sleep on it." Before RN let the IV team nurse leave the floor, RN went to go see if patient was sure that she wanted to stay on the floor. Pt said yes still after being asked a lot. MD Rogue Bussing called and made aware of situation. Pt is still in room 1420.   Carmela Hurt, RN

## 2016-02-14 DIAGNOSIS — D5700 Hb-SS disease with crisis, unspecified: Principal | ICD-10-CM

## 2016-02-14 LAB — CBC WITH DIFFERENTIAL/PLATELET
BASOS PCT: 1 %
Basophils Absolute: 0.1 10*3/uL (ref 0.0–0.1)
EOS ABS: 0.2 10*3/uL (ref 0.0–0.7)
Eosinophils Relative: 2 %
HCT: 18.6 % — ABNORMAL LOW (ref 36.0–46.0)
Hemoglobin: 6.9 g/dL — CL (ref 12.0–15.0)
LYMPHS ABS: 6 10*3/uL — AB (ref 0.7–4.0)
Lymphocytes Relative: 59 %
MCH: 28.9 pg (ref 26.0–34.0)
MCHC: 37.1 g/dL — AB (ref 30.0–36.0)
MCV: 77.8 fL — ABNORMAL LOW (ref 78.0–100.0)
MONO ABS: 1.2 10*3/uL — AB (ref 0.1–1.0)
Monocytes Relative: 12 %
NEUTROS ABS: 2.6 10*3/uL (ref 1.7–7.7)
Neutrophils Relative %: 26 %
PLATELETS: 367 10*3/uL (ref 150–400)
RBC: 2.39 MIL/uL — ABNORMAL LOW (ref 3.87–5.11)
RDW: 17.5 % — ABNORMAL HIGH (ref 11.5–15.5)
WBC: 10.1 10*3/uL (ref 4.0–10.5)

## 2016-02-14 LAB — BASIC METABOLIC PANEL
Anion gap: 9 (ref 5–15)
CO2: 27 mmol/L (ref 22–32)
CREATININE: 0.32 mg/dL — AB (ref 0.44–1.00)
Calcium: 8.8 mg/dL — ABNORMAL LOW (ref 8.9–10.3)
Chloride: 103 mmol/L (ref 101–111)
GFR calc Af Amer: >60 mL/min (ref >60)
Glucose, Bld: 113 mg/dL — ABNORMAL HIGH (ref 65–99)
Potassium: 3.5 mmol/L (ref 3.5–5.1)
SODIUM: 139 mmol/L (ref 135–145)

## 2016-02-14 LAB — CULTURE, RESPIRATORY W GRAM STAIN: Culture: NORMAL

## 2016-02-14 LAB — CULTURE, RESPIRATORY

## 2016-02-14 MED ORDER — KETOROLAC TROMETHAMINE 30 MG/ML IJ SOLN
30.0000 mg | Freq: Four times a day (QID) | INTRAMUSCULAR | Status: DC
  Administered 2016-02-14 – 2016-02-15 (×4): 30 mg via INTRAVENOUS
  Filled 2016-02-14 (×9): qty 1

## 2016-02-14 NOTE — Progress Notes (Signed)
CRITICAL VALUE ALERT  Critical value received:  hgb - 6.9  Date of notification:  02/14/2016   Time of notification:  07:35  Critical value read back:Yes.    Nurse who received alert:  B.Brigitte Pulse  MD notified (1st page):  Jonelle Sidle  Time of first page:  07:40  MD notified (2nd page): on call number for matthews [phone]) and Jonelle Sidle   Time of second page:  Responding MD:    Time MD responded:
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
## 2016-02-15 LAB — CBC WITH DIFFERENTIAL/PLATELET
Basophils Absolute: 0 10*3/uL (ref 0.0–0.1)
Basophils Relative: 0 %
EOS ABS: 0.2 10*3/uL (ref 0.0–0.7)
EOS PCT: 2 %
HEMATOCRIT: 18.9 % — AB (ref 36.0–46.0)
HEMOGLOBIN: 7 g/dL — AB (ref 12.0–15.0)
Lymphocytes Relative: 65 %
Lymphs Abs: 8.1 10*3/uL — ABNORMAL HIGH (ref 0.7–4.0)
MCH: 29.8 pg (ref 26.0–34.0)
MCHC: 37 g/dL — ABNORMAL HIGH (ref 30.0–36.0)
MCV: 80.4 fL (ref 78.0–100.0)
MONOS PCT: 14 %
Monocytes Absolute: 1.7 10*3/uL — ABNORMAL HIGH (ref 0.1–1.0)
NEUTROS ABS: 2.4 10*3/uL (ref 1.7–7.7)
NRBC: 5 /100{WBCs} — AB
Neutrophils Relative %: 19 %
Platelets: 435 10*3/uL — ABNORMAL HIGH (ref 150–400)
RBC: 2.35 MIL/uL — ABNORMAL LOW (ref 3.87–5.11)
RDW: 18 % — ABNORMAL HIGH (ref 11.5–15.5)
WBC: 12.4 10*3/uL — ABNORMAL HIGH (ref 4.0–10.5)

## 2016-02-15 LAB — COMPREHENSIVE METABOLIC PANEL
ALK PHOS: 57 U/L (ref 38–126)
ALT: 65 U/L — AB (ref 14–54)
AST: 70 U/L — ABNORMAL HIGH (ref 15–41)
Albumin: 3.7 g/dL (ref 3.5–5.0)
Anion gap: 5 (ref 5–15)
BUN: <5 mg/dL — ABNORMAL LOW (ref 6–20)
CALCIUM: 8.6 mg/dL — AB (ref 8.9–10.3)
CO2: 30 mmol/L (ref 22–32)
CREATININE: 0.43 mg/dL — AB (ref 0.44–1.00)
Chloride: 105 mmol/L (ref 101–111)
GFR calc non Af Amer: >60 mL/min (ref >60)
GLUCOSE: 114 mg/dL — AB (ref 65–99)
Potassium: 3.4 mmol/L — ABNORMAL LOW (ref 3.5–5.1)
Sodium: 140 mmol/L (ref 135–145)
Total Bilirubin: 2.6 mg/dL — ABNORMAL HIGH (ref 0.3–1.2)
Total Protein: 6.8 g/dL (ref 6.5–8.1)

## 2016-02-15 MED ORDER — POTASSIUM CHLORIDE CRYS ER 20 MEQ PO TBCR
20.0000 meq | EXTENDED_RELEASE_TABLET | Freq: Once | ORAL | Status: AC
Start: 2016-02-15 — End: 2016-02-15
  Administered 2016-02-15: 20 meq via ORAL
  Filled 2016-02-15: qty 1

## 2016-02-15 NOTE — Discharge Summary (Signed)
Physician Discharge Summary  Patient ID: Carmen [name] MRN: [id number] DOB/AGE: [birth year] 23 y.o.  Admit date: 02/10/2016 Discharge date: 02/15/2016  Admission Diagnoses:  Discharge Diagnoses:  Principal Problem:   Hb-SS disease with crisis [name]) Active Problems:   Sickle cell pain crisis ([place])   Anemia   Leukocytosis   Thrombocytosis ([place])   Acute URI   Discharged Condition: good  Hospital Course: Patient admitted with sickle cell painful crisis as well as Influenza infection. Was treated with Tamiflu and IV Dilaudid with Toradol and IVF. Has been treated with antibiotics for acute URI. Has done better and pain is now at 4/10 from 10/10. Patient stable now and discharged home on home regimen.  Consults: None  Significant Diagnostic Studies: labs: CBCs and CMPs   Treatments: IV hydration, antibiotics: ceftriaxone and azithromycin, analgesia: Dilaudid and Tamiflu  Discharge Exam: Blood pressure 106/64, pulse 74, temperature 98.8 F (37.1 C), temperature source Oral, resp. rate 15, height 5\' 2"  (1.575 m), weight 79.9 kg (176 lb 2.4 oz), last menstrual period 01/20/2016, SpO2 100 %. General appearance: alert, cooperative, appears stated age and no distress Neck: no adenopathy, no carotid bruit, no JVD, supple, symmetrical, trachea midline and thyroid not enlarged, symmetric, no tenderness/mass/nodules Back: symmetric, no curvature. ROM normal. No CVA tenderness. Resp: clear to auscultation bilaterally Chest wall: no tenderness Cardio: regular rate and rhythm, S1, S2 normal, no murmur, click, rub or gallop GI: soft, non-tender; bowel sounds normal; no masses,  no organomegaly Extremities: extremities normal, atraumatic, no cyanosis or edema Pulses: 2+ and symmetric Skin: Skin color, texture, turgor normal. No rashes or lesions Neurologic: Grossly normal  Disposition: 01-Home or Self Care     Medication List    TAKE these medications        amoxicillin 500 MG capsule   Commonly known as:  AMOXIL  Take 500 mg by mouth 3 (three) times daily. Reported on 02/10/2016     ibuprofen 800 MG tablet  Commonly known as:  ADVIL,MOTRIN  Take 800 mg by mouth 3 (three) times daily as needed for headache or moderate pain. pain     oxyCODONE-acetaminophen 10-325 MG tablet  Commonly known as:  PERCOCET  Take 1 tablet by mouth every 6 (six) hours as needed. For pain         Signed: Oris Staffieri,LAWAL 02/15/2016, 8:39 AM  Time spent 33 minutes

## 2016-02-15 NOTE — Progress Notes (Signed)
28ml of dilaudid wasted that was left in PCA. Catie, RN witnessed.  Barbee [place]. Brigitte Pulse, RN

## 2016-02-16 LAB — CULTURE, BLOOD (ROUTINE X 2)
CULTURE: NO GROWTH
CULTURE: NO GROWTH

## 2016-03-03 ENCOUNTER — Encounter (HOSPITAL_COMMUNITY): Payer: Self-pay | Admitting: Emergency Medicine

## 2016-03-03 ENCOUNTER — Emergency Department (HOSPITAL_COMMUNITY)
Admission: EM | Admit: 2016-03-03 | Discharge: 2016-03-03 | Disposition: A | Discharge status: Home / Self Care | Payer: Medicaid Other | Attending: Emergency Medicine | Admitting: Emergency Medicine

## 2016-03-03 ENCOUNTER — Emergency Department (HOSPITAL_COMMUNITY): Payer: Medicaid Other

## 2016-03-03 DIAGNOSIS — Z791 Long term (current) use of non-steroidal anti-inflammatories (NSAID): Secondary | ICD-10-CM | POA: Insufficient documentation

## 2016-03-03 DIAGNOSIS — S40021A Contusion of right upper arm, initial encounter: Secondary | ICD-10-CM | POA: Insufficient documentation

## 2016-03-03 DIAGNOSIS — Y999 Unspecified external cause status: Secondary | ICD-10-CM | POA: Insufficient documentation

## 2016-03-03 DIAGNOSIS — Y9389 Activity, other specified: Secondary | ICD-10-CM | POA: Diagnosis not present

## 2016-03-03 DIAGNOSIS — M79601 Pain in right arm: Secondary | ICD-10-CM | POA: Diagnosis present

## 2016-03-03 DIAGNOSIS — Y92511 Restaurant or cafe as the place of occurrence of the external cause: Secondary | ICD-10-CM | POA: Diagnosis not present

## 2016-03-03 DIAGNOSIS — Z792 Long term (current) use of antibiotics: Secondary | ICD-10-CM | POA: Insufficient documentation

## 2016-03-03 DIAGNOSIS — Z79891 Long term (current) use of opiate analgesic: Secondary | ICD-10-CM | POA: Diagnosis not present

## 2016-03-03 DIAGNOSIS — Z79899 Other long term (current) drug therapy: Secondary | ICD-10-CM | POA: Insufficient documentation

## 2016-03-03 MED ORDER — HYDROCODONE-ACETAMINOPHEN 5-325 MG PO TABS
2.0000 | ORAL_TABLET | Freq: Once | ORAL | Status: AC
  Administered 2016-03-03: 2 via ORAL
  Filled 2016-03-03: qty 2

## 2016-03-03 MED ORDER — IBUPROFEN 800 MG PO TABS: 800.0000 mg | ORAL_TABLET | Freq: Three times a day (TID) | ORAL | Status: DC

## 2016-03-03 MED ORDER — CYCLOBENZAPRINE HCL 10 MG PO TABS: 10.0000 mg | ORAL_TABLET | Freq: Two times a day (BID) | ORAL | Status: DC | PRN

## 2016-03-03 MED ORDER — HYDROCODONE-ACETAMINOPHEN 5-325 MG PO TABS: 2.0000 | ORAL_TABLET | ORAL | Status: DC | PRN
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
## 2016-03-03 NOTE — ED Notes (Signed)
Patient transported to X-ray 

## 2016-03-03 NOTE — Discharge Instructions (Signed)
Contusion A contusion is a deep bruise. Contusions are the result of a blunt injury to tissues and muscle fibers under the skin. The injury causes bleeding under the skin. The skin overlying the contusion may turn blue, purple, or yellow. Minor injuries will give you a painless contusion, but more severe contusions may stay painful and swollen for a few weeks.  CAUSES  This condition is usually caused by a blow, trauma, or direct force to an area of the body. SYMPTOMS  Symptoms of this condition include:  Swelling of the injured area.  Pain and tenderness in the injured area.  Discoloration. The area may have redness and then turn blue, purple, or yellow. DIAGNOSIS  This condition is diagnosed based on a physical exam and medical history. An X-ray, CT scan, or MRI may be needed to determine if there are any associated injuries, such as broken bones (fractures). TREATMENT  Specific treatment for this condition depends on what area of the body was injured. In general, the best treatment for a contusion is resting, icing, applying pressure to (compression), and elevating the injured area. This is often called the RICE strategy. Over-the-counter anti-inflammatory medicines may also be recommended for pain control.  HOME CARE INSTRUCTIONS   Rest the injured area.  If directed, apply ice to the injured area:  Put ice in a plastic bag.  Place a towel between your skin and the bag.  Leave the ice on for 20 minutes, 2-3 times per day.  If directed, apply light compression to the injured area using an elastic bandage. Make sure the bandage is not wrapped too tightly. Remove and reapply the bandage as directed by your health care provider.  If possible, raise (elevate) the injured area above the level of your heart while you are sitting or lying down.  Take over-the-counter and prescription medicines only as told by your health care provider. SEEK MEDICAL CARE IF:  Your symptoms do not  improve after several days of treatment.  Your symptoms get worse.  You have difficulty moving the injured area. SEEK IMMEDIATE MEDICAL CARE IF:   You have severe pain.  You have numbness in a hand or foot.  Your hand or foot turns pale or cold.   This information is not intended to replace advice given to you by your health care provider. Make sure you discuss any questions you have with your health care provider.   Document Released: 07/28/2005 Document Revised: 07/09/2015 Document Reviewed: 03/05/2015 Elsevier Interactive Patient Education 2016 [place] Assault Assault includes any behavior or physical attack--whether it is on purpose or not--that results in injury to another person, damage to property, or both. This also includes assault that has not yet happened, but is planned to happen. Threats of assault may be physical, verbal, or written. They may be said or sent by:  Mail.  E-mail.  Text.  Social media.  Fax. The threats may be direct, implied, or understood. WHAT ARE THE DIFFERENT FORMS OF ASSAULT? Forms of assault include:  Physically assaulting a person. This includes physical threats to inflict physical harm as well as:  Slapping.  Hitting.  Poking.  Kicking.  Punching.  Pushing.  Sexually assaulting a person. Sexual assault is any sexual activity that a person is forced, threatened, or coerced to participate in. It may or may not involve physical contact with the person who is assaulting you. You are sexually assaulted if you are forced to have sexual contact of any kind.  Damaging or destroying a person's assistive equipment, such as glasses, canes, or walkers.  Throwing or hitting objects.  Using or displaying a weapon to harm or threaten someone.  Using or displaying an object that appears to be a weapon in a threatening manner.  Using greater physical size or strength to intimidate someone.  Making intimidating or  threatening gestures.  Bullying.  Hazing.  Using language that is intimidating, threatening, hostile, or abusive.  Stalking.  Restraining someone with force. WHAT SHOULD I DO IF I EXPERIENCE ASSAULT?  Report assaults, threats, and stalking to the police. Call your local emergency services (911 in the [place].S[place] if you are in immediate danger or you need medical help.  You can work with a Chief Executive Officer or an advocate to get legal protection against someone who has assaulted you or threatened you with assault. Protection includes restraining orders and private addresses. Crimes against you, such as assault, can also be prosecuted through the courts. Laws will vary depending on where you live.   This information is not intended to replace advice given to you by your health care provider. Make sure you discuss any questions you have with your health care provider.   Document Released: 10/18/2005 Document Revised: 11/08/2014 Document Reviewed: 07/05/2014 Elsevier Interactive Patient Education 2016 [place] [address] is a great source of information about community services available.  Access by dialing 2-1-1 from anywhere in [place], or by website -  Cust[employer]ian[employer]fi.   Other Local Resources (Updated 11/2015)  Abuse and  Neglect     Services     Phone Number and Address  [place]: Child/ Elder Abuse  Hotline - call 24 hours a day, 7 days a week to report abuse and neglect or children or adults [place] [place], [place] [address]  [place]: Child/Elder Abuse   Hotline - call 24 hours a day, 7 days a week to report abuse and neglect of children or adults [place] [address] [place], [place] [address]  [facility]   Hotline - call 24 hours a day, 7 days a week for  support for people who have been sexually assaulted    Confidential counseling [phone] [place], [place] [address]  [facility] [place] - call 24-hours a day, 7 days a week for support and emergency housing for women dealing with domestic violence and their children  Temporary housing  Support in court  Supervised visitation  Support groups [place]: Child/Elder Abuse   Hotline - call 24 hours a day, 7 days a week to report abuse and neglect or children or adults [place], [place] [address]  [place]: Child/Elder Abuse  Hotline - call 24 hours a day, 7 days a week to report abuse and neglect or children or adults [phone] (after-hours) [place], [place] [address]  National Domestic Violence Hotline   Hotline - call 24-hours a day, 7 days a week for support and emergency housing for people dealing with domestic violence [place]: Child/Elder Abuse   Hotline - call 24 hours a day, 7 days a week to report abuse and neglect or children or adults [place] [facility]  [facility] [address] [place], [place] [address]  [place]: Child/ Elder Abuse  Hotline - call 24 hours a day, 7 days a week to report abuse and neglect or children or adults [phone] (after-hours) [facility] [place], [place] [address]

## 2016-04-19 ENCOUNTER — Inpatient Hospital Stay (HOSPITAL_COMMUNITY)
Admission: EM | Admit: 2016-04-19 | Discharge: 2016-04-26 | DRG: 812 | Disposition: A | Discharge status: Home / Self Care | Payer: Medicaid Other | Attending: Internal Medicine | Admitting: Internal Medicine

## 2016-04-19 ENCOUNTER — Encounter (HOSPITAL_COMMUNITY): Payer: Self-pay | Admitting: Emergency Medicine

## 2016-04-19 DIAGNOSIS — Z841 Family history of disorders of kidney and ureter: Secondary | ICD-10-CM

## 2016-04-19 DIAGNOSIS — R42 Dizziness and giddiness: Secondary | ICD-10-CM | POA: Diagnosis not present

## 2016-04-19 DIAGNOSIS — Z8249 Family history of ischemic heart disease and other diseases of the circulatory system: Secondary | ICD-10-CM | POA: Diagnosis not present

## 2016-04-19 DIAGNOSIS — R112 Nausea with vomiting, unspecified: Secondary | ICD-10-CM | POA: Diagnosis present

## 2016-04-19 DIAGNOSIS — D7589 Other specified diseases of blood and blood-forming organs: Secondary | ICD-10-CM | POA: Diagnosis present

## 2016-04-19 DIAGNOSIS — E876 Hypokalemia: Secondary | ICD-10-CM | POA: Diagnosis present

## 2016-04-19 DIAGNOSIS — D5700 Hb-SS disease with crisis, unspecified: Principal | ICD-10-CM

## 2016-04-19 DIAGNOSIS — D638 Anemia in other chronic diseases classified elsewhere: Secondary | ICD-10-CM | POA: Diagnosis present

## 2016-04-19 DIAGNOSIS — R52 Pain, unspecified: Secondary | ICD-10-CM | POA: Diagnosis present

## 2016-04-19 DIAGNOSIS — D473 Essential (hemorrhagic) thrombocythemia: Secondary | ICD-10-CM | POA: Diagnosis not present

## 2016-04-19 DIAGNOSIS — Z9081 Acquired absence of spleen: Secondary | ICD-10-CM

## 2016-04-19 DIAGNOSIS — Z8261 Family history of arthritis: Secondary | ICD-10-CM

## 2016-04-19 DIAGNOSIS — Z832 Family history of diseases of the blood and blood-forming organs and certain disorders involving the immune mechanism: Secondary | ICD-10-CM

## 2016-04-19 DIAGNOSIS — E860 Dehydration: Secondary | ICD-10-CM | POA: Diagnosis present

## 2016-04-19 DIAGNOSIS — D72829 Elevated white blood cell count, unspecified: Secondary | ICD-10-CM | POA: Diagnosis present

## 2016-04-19 LAB — CBC WITH DIFFERENTIAL/PLATELET
Basophils Absolute: 0 10*3/uL (ref 0.0–0.1)
Basophils Relative: 0 %
EOS ABS: 0.1 10*3/uL (ref 0.0–0.7)
Eosinophils Relative: 1 %
HCT: 26.4 % — ABNORMAL LOW (ref 36.0–46.0)
HEMOGLOBIN: 9.4 g/dL — AB (ref 12.0–15.0)
LYMPHS ABS: 3.9 10*3/uL (ref 0.7–4.0)
LYMPHS PCT: 36 %
MCH: 29.4 pg (ref 26.0–34.0)
MCHC: 35.6 g/dL (ref 30.0–36.0)
MCV: 82.5 fL (ref 78.0–100.0)
MONOS PCT: 12 %
Monocytes Absolute: 1.3 10*3/uL — ABNORMAL HIGH (ref 0.1–1.0)
NEUTROS PCT: 51 %
Neutro Abs: 5.4 10*3/uL (ref 1.7–7.7)
Platelets: 627 10*3/uL — ABNORMAL HIGH (ref 150–400)
RBC: 3.2 MIL/uL — ABNORMAL LOW (ref 3.87–5.11)
RDW: 15 % (ref 11.5–15.5)
WBC: 10.6 10*3/uL — ABNORMAL HIGH (ref 4.0–10.5)

## 2016-04-19 LAB — COMPREHENSIVE METABOLIC PANEL
ALK PHOS: 51 U/L (ref 38–126)
ALT: 14 U/L (ref 14–54)
AST: 19 U/L (ref 15–41)
Albumin: 4.5 g/dL (ref 3.5–5.0)
Anion gap: 6 (ref 5–15)
BUN: 11 mg/dL (ref 6–20)
CALCIUM: 9.5 mg/dL (ref 8.9–10.3)
CO2: 23 mmol/L (ref 22–32)
CREATININE: 0.43 mg/dL — AB (ref 0.44–1.00)
Chloride: 111 mmol/L (ref 101–111)
Glucose, Bld: 94 mg/dL (ref 65–99)
Potassium: 3.6 mmol/L (ref 3.5–5.1)
Sodium: 140 mmol/L (ref 135–145)
Total Bilirubin: 1.9 mg/dL — ABNORMAL HIGH (ref 0.3–1.2)
Total Protein: 7.7 g/dL (ref 6.5–8.1)

## 2016-04-19 LAB — RETICULOCYTES
RBC.: 3.2 MIL/uL — ABNORMAL LOW (ref 3.87–5.11)
RETIC CT PCT: 7.1 % — AB (ref 0.4–3.1)
Retic Count, Absolute: 227.2 10*3/uL — ABNORMAL HIGH (ref 19.0–186.0)

## 2016-04-19 LAB — LACTATE DEHYDROGENASE: LDH: 195 U/L — AB (ref 98–192)

## 2016-04-19 MED ORDER — HYDROMORPHONE HCL 2 MG/ML IJ SOLN
2.0000 mg | Freq: Once | INTRAMUSCULAR | Status: AC
  Administered 2016-04-19: 2 mg via INTRAVENOUS
  Filled 2016-04-19: qty 1

## 2016-04-19 MED ORDER — ONDANSETRON HCL 4 MG/2ML IJ SOLN
4.0000 mg | Freq: Four times a day (QID) | INTRAMUSCULAR | Status: DC | PRN
  Administered 2016-04-20 – 2016-04-24 (×2): 4 mg via INTRAVENOUS
  Filled 2016-04-19 (×2): qty 2

## 2016-04-19 MED ORDER — DEXTROSE-NACL 5-0.45 % IV SOLN
INTRAVENOUS | Status: DC
  Administered 2016-04-19 – 2016-04-24 (×6): via INTRAVENOUS

## 2016-04-19 MED ORDER — DIPHENHYDRAMINE HCL 25 MG PO CAPS
25.0000 mg | ORAL_CAPSULE | Freq: Four times a day (QID) | ORAL | Status: DC | PRN
  Administered 2016-04-20 – 2016-04-24 (×4): 50 mg via ORAL
  Filled 2016-04-19: qty 2
  Filled 2016-04-19: qty 1
  Filled 2016-04-19 (×2): qty 2

## 2016-04-19 MED ORDER — DEXTROSE 5 % IV BOLUS
1000.0000 mL | Freq: Once | INTRAVENOUS | Status: AC
  Administered 2016-04-19: 1000 mL via INTRAVENOUS

## 2016-04-19 MED ORDER — MORPHINE SULFATE ER 30 MG PO TBCR
60.0000 mg | EXTENDED_RELEASE_TABLET | Freq: Two times a day (BID) | ORAL | Status: DC
  Administered 2016-04-19 – 2016-04-26 (×15): 60 mg via ORAL
  Filled 2016-04-19 (×13): qty 2
  Filled 2016-04-19: qty 4
  Filled 2016-04-19: qty 2

## 2016-04-19 MED ORDER — DIPHENHYDRAMINE HCL 25 MG PO CAPS
50.0000 mg | ORAL_CAPSULE | Freq: Once | ORAL | Status: AC
  Administered 2016-04-19: 50 mg via ORAL
  Filled 2016-04-19: qty 2

## 2016-04-19 MED ORDER — SODIUM CHLORIDE 0.9 % IV SOLN
25.0000 mg | INTRAVENOUS | Status: DC | PRN
  Administered 2016-04-20: 25 mg via INTRAVENOUS
  Filled 2016-04-19 (×4): qty 0.5

## 2016-04-19 MED ORDER — HYDROMORPHONE HCL 2 MG/ML IJ SOLN
0.0375 mg/kg | INTRAMUSCULAR | Status: DC
  Administered 2016-04-19 (×6): 3 mg via INTRAVENOUS
  Filled 2016-04-19 (×6): qty 2

## 2016-04-19 MED ORDER — ONDANSETRON HCL 4 MG/2ML IJ SOLN
4.0000 mg | Freq: Once | INTRAMUSCULAR | Status: AC
  Administered 2016-04-19: 4 mg via INTRAVENOUS
  Filled 2016-04-19: qty 2

## 2016-04-19 MED ORDER — SENNOSIDES-DOCUSATE SODIUM 8.6-50 MG PO TABS
1.0000 | ORAL_TABLET | Freq: Two times a day (BID) | ORAL | Status: DC
  Administered 2016-04-19 – 2016-04-26 (×14): 1 via ORAL
  Filled 2016-04-19 (×14): qty 1

## 2016-04-19 MED ORDER — DEXTROSE-NACL 5-0.45 % IV SOLN
INTRAVENOUS | Status: DC
  Administered 2016-04-19: 11:00:00 via INTRAVENOUS

## 2016-04-19 MED ORDER — NALOXONE HCL 0.4 MG/ML IJ SOLN: 0.4000 mg | INTRAMUSCULAR | Status: DC | PRN

## 2016-04-19 MED ORDER — POLYETHYLENE GLYCOL 3350 17 G PO PACK
17.0000 g | PACK | Freq: Every day | ORAL | Status: DC | PRN
  Administered 2016-04-22 – 2016-04-25 (×2): 17 g via ORAL
  Filled 2016-04-19 (×3): qty 1

## 2016-04-19 MED ORDER — HYDROMORPHONE 1 MG/ML IV SOLN
INTRAVENOUS | Status: DC
  Administered 2016-04-19: 25 mg via INTRAVENOUS
  Administered 2016-04-19: 2 mg via INTRAVENOUS
  Administered 2016-04-20: 5.5 mg via INTRAVENOUS
  Administered 2016-04-20: 5 mg via INTRAVENOUS
  Administered 2016-04-20: 4 mg via INTRAVENOUS
  Administered 2016-04-20: 3.5 mg via INTRAVENOUS
  Administered 2016-04-20: 1 mg via INTRAVENOUS
  Administered 2016-04-20: 3.5 mg via INTRAVENOUS
  Administered 2016-04-21: 7.5 mg via INTRAVENOUS
  Administered 2016-04-21: 3.9 mg via INTRAVENOUS
  Administered 2016-04-21: 4 mg via INTRAVENOUS
  Administered 2016-04-21: 25 mg via INTRAVENOUS
  Administered 2016-04-21: 3.5 mg via INTRAVENOUS
  Administered 2016-04-21: 7 mg via INTRAVENOUS
  Administered 2016-04-21: 5 mg via INTRAVENOUS
  Administered 2016-04-22: 0.5 mg via INTRAVENOUS
  Administered 2016-04-22: 9 mg via INTRAVENOUS
  Administered 2016-04-22: 8 mg via INTRAVENOUS
  Administered 2016-04-22: 5 mg via INTRAVENOUS
  Administered 2016-04-22: 11 mg via INTRAVENOUS
  Administered 2016-04-22 – 2016-04-23 (×2): 5 mg via INTRAVENOUS
  Administered 2016-04-23: 3 mg via INTRAVENOUS
  Administered 2016-04-23 (×2): 5 mg via INTRAVENOUS
  Administered 2016-04-24: 16:00:00 via INTRAVENOUS
  Administered 2016-04-24: 5 mg via INTRAVENOUS
  Administered 2016-04-24: 2.5 mg via INTRAVENOUS
  Administered 2016-04-25: 9 mg via INTRAVENOUS
  Administered 2016-04-25: 07:00:00 via INTRAVENOUS
  Administered 2016-04-25: 7.5 mg via INTRAVENOUS
  Administered 2016-04-25: 6.5 mg via INTRAVENOUS
  Administered 2016-04-25: 8 mg via INTRAVENOUS
  Administered 2016-04-25: 0.5 mg via INTRAVENOUS
  Administered 2016-04-26: 10.5 mg via INTRAVENOUS
  Administered 2016-04-26: 6.96 mg via INTRAVENOUS
  Administered 2016-04-26: via INTRAVENOUS
  Administered 2016-04-26: 5.5 mg via INTRAVENOUS
  Administered 2016-04-26: 9 mg via INTRAVENOUS
  Filled 2016-04-19 (×10): qty 25

## 2016-04-19 MED ORDER — HYDROMORPHONE HCL 2 MG/ML IJ SOLN: 0.0375 mg/kg | INTRAMUSCULAR | Status: DC

## 2016-04-19 MED ORDER — HYDROMORPHONE HCL 2 MG/ML IJ SOLN
3.0000 mg | Freq: Once | INTRAMUSCULAR | Status: AC
  Administered 2016-04-19: 3 mg via INTRAVENOUS
  Filled 2016-04-19: qty 2

## 2016-04-19 MED ORDER — HYDROMORPHONE HCL 2 MG/ML IJ SOLN
2.0000 mg | INTRAMUSCULAR | Status: AC
  Administered 2016-04-19 – 2016-04-20 (×9): 2 mg via INTRAVENOUS
  Filled 2016-04-19 (×9): qty 1

## 2016-04-19 MED ORDER — SODIUM CHLORIDE 0.9% FLUSH: 9.0000 mL | INTRAVENOUS | Status: DC | PRN

## 2016-04-19 MED ORDER — KETOROLAC TROMETHAMINE 30 MG/ML IJ SOLN
30.0000 mg | Freq: Four times a day (QID) | INTRAMUSCULAR | Status: DC
  Administered 2016-04-19 – 2016-04-23 (×18): 30 mg via INTRAVENOUS
  Filled 2016-04-19 (×19): qty 1

## 2016-04-19 MED ORDER — HYDROMORPHONE HCL 2 MG/ML IJ SOLN
2.0000 mg | INTRAMUSCULAR | Status: DC | PRN
  Administered 2016-04-21 – 2016-04-22 (×4): 2 mg via INTRAVENOUS
  Filled 2016-04-19 (×4): qty 1

## 2016-04-19 MED ORDER — ENOXAPARIN SODIUM 40 MG/0.4ML ~~LOC~~ SOLN
40.0000 mg | SUBCUTANEOUS | Status: DC
  Filled 2016-04-19 (×3): qty 0.4
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
## 2016-04-19 NOTE — Progress Notes (Signed)
UR completed [facility] 

## 2016-04-19 NOTE — ED Notes (Signed)
Pt asleep , upon arousal pt reports pain is 10/10, pt received 3 mg Dilaudid immediately. Pt's O2 sat dropped to 86 % ,  administer 2 L O2 via [place].

## 2016-04-19 NOTE — ED Provider Notes (Signed)
CSN: [id number]     Arrival date & time 04/19/16  0413 History   First MD Initiated Contact with Patient 04/19/16 [phone]     Chief Complaint  Patient presents with  . Sickle Cell Pain Crisis     (Consider location/radiation/quality/duration/timing/severity/associated sxs/prior Treatment) Patient is a 25 y.o. female presenting with sickle cell pain. The history is provided by the patient.  Sickle Cell Pain Crisis Location:  Diffuse Severity:  Severe Onset quality:  Gradual Duration:  5 days Similar to previous crisis episodes: yes   Timing:  Constant Progression:  Worsening Chronicity:  Recurrent Relieved by:  Nothing Worsened by:  Nothing tried Ineffective treatments:  None tried Associated symptoms: no chest pain, no congestion, no fever, no headaches, no nausea, no shortness of breath, no vomiting and no wheezing    25 yo F With a chief complaint of sickle cell pain crisis. Pain feels similar to her prior crises. Denies fevers or chills. Denies chest pain. Denies new areas of pain. She is unsure what is causing this episode.  Past Medical History  Diagnosis Date  . Sickle cell crisis ([place])   . Headache(784.0)   . Abortion     05/2012   Past Surgical History  Procedure Laterality Date  . Splenectomy    . Cholecystectomy N/A 11/30/2014    Procedure: LAPAROSCOPIC CHOLECYSTECTOMY SINGLE SITE WITH INTRAOPERATIVE CHOLANGIOGRAM;  Surgeon: Michael Boston, MD;  Location: [facility];  Service: General;  Laterality: N/A;   Family History  Problem Relation Age of Onset  . Hypertension Mother   . Sickle cell anemia Sister   . Kidney disease Sister     Lupus  . Arthritis Sister   . Sickle cell anemia Sister   . Sickle cell trait Sister   . Heart disease Maternal Aunt   . Heart disease Maternal Uncle    Social History  Substance Use Topics  . Smoking status: Never Smoker   . Smokeless tobacco: Never Used  . Alcohol Use: No   OB History    Gravida Para Term Preterm AB TAB SAB  Ectopic Multiple Living   1    1          Review of Systems  Constitutional: Negative for fever and chills.  HENT: Negative for congestion and rhinorrhea.   Eyes: Negative for redness and visual disturbance.  Respiratory: Negative for shortness of breath and wheezing.   Cardiovascular: Negative for chest pain and palpitations.  Gastrointestinal: Negative for nausea and vomiting.  Genitourinary: Negative for dysuria and urgency.  Musculoskeletal: Positive for myalgias, back pain and arthralgias.  Skin: Negative for pallor and wound.  Neurological: Negative for dizziness and headaches.      Allergies  Review of patient's allergies indicates no known allergies.  Home Medications   Prior to Admission medications   Medication Sig Start Date End Date Taking? Authorizing Provider  ibuprofen (ADVIL,MOTRIN) 800 MG tablet Take 1 tablet (800 mg total) by mouth 3 (three) times daily. 03/03/16  Yes Delsa Grana, PA-C  MORPHINE SULFATE PO Take 60 mg by mouth every 12 (twelve) hours.   Yes Historical Provider, MD  oxyCODONE-acetaminophen (PERCOCET) 10-325 MG tablet Take 1 tablet by mouth every 6 (six) hours as needed. For pain 01/27/16  Yes Historical Provider, MD  cyclobenzaprine (FLEXERIL) 10 MG tablet Take 1 tablet (10 mg total) by mouth 2 (two) times daily as needed for muscle spasms. 03/03/16   Delsa Grana, PA-C  HYDROcodone-acetaminophen (NORCO/VICODIN) 5-325 MG tablet Take 2 tablets by mouth  every 4 (four) hours as needed. 03/03/16   Delsa Grana, PA-C   BP 118/77 mmHg  Pulse 66  Temp(Src) 98.5 F (36.9 C) (Oral)  Resp 16  Ht 5\' 6"  (1.676 m)  Wt 175 lb (79.379 kg)  BMI 28.26 kg/m2  SpO2 99%  LMP 04/01/2016 Physical Exam  Constitutional: She is oriented to person, place, and time. She appears well-developed and well-nourished. No distress.  HENT:  Head: Normocephalic and atraumatic.  Eyes: EOM are normal. Pupils are equal, round, and reactive to light.  Neck: Normal range of motion. Neck  supple.  Cardiovascular: Normal rate and regular rhythm.  Exam reveals no gallop and no friction rub.   No murmur heard. Pulmonary/Chest: Effort normal. She has no wheezes. She has no rales.  Abdominal: Soft. She exhibits no distension. There is no tenderness.  Musculoskeletal: She exhibits no edema or tenderness.  Neurological: She is alert and oriented to person, place, and time.  Skin: Skin is warm and dry. She is not diaphoretic.  Psychiatric: She has a normal mood and affect. Her behavior is normal.  Nursing note and vitals reviewed.   ED Course  Procedures (including critical care time) Labs Review Labs Reviewed  CBC WITH DIFFERENTIAL/PLATELET - Abnormal; Notable for the following:    WBC 10.6 (*)    RBC 3.20 (*)    Hemoglobin 9.4 (*)    HCT 26.4 (*)    Platelets 627 (*)    Monocytes Absolute 1.3 (*)    All other components within normal limits  COMPREHENSIVE METABOLIC PANEL - Abnormal; Notable for the following:    Creatinine, Ser 0.43 (*)    Total Bilirubin 1.9 (*)    All other components within normal limits  RETICULOCYTES - Abnormal; Notable for the following:    Retic Ct Pct 7.1 (*)    RBC. 3.20 (*)    Retic Count, Manual 227.2 (*)    All other components within normal limits    Imaging Review No results found. I have personally reviewed and evaluated these images and lab results as part of my medical decision-making.   EKG Interpretation None      MDM   Final diagnoses:  Sickle cell anemia with crisis [facility])    25 yo F with sickle cell pain crisis. Will aggressively treat pain.  Patient given three doses of meds, no improvement of pain, will admit.   The patients results and plan were reviewed and discussed.   Any x-rays performed were independently reviewed by myself.   Differential diagnosis were considered with the presenting HPI.  Medications  dextrose 5 % bolus 1,000 mL (1,000 mLs Intravenous New Bag/Given 04/19/16 0523)  HYDROmorphone  (DILAUDID) injection 2 mg (2 mg Intravenous Given 04/19/16 0523)  ondansetron (ZOFRAN) injection 4 mg (4 mg Intravenous Given 04/19/16 0523)  diphenhydrAMINE (BENADRYL) capsule 50 mg (50 mg Oral Given 04/19/16 0527)  HYDROmorphone (DILAUDID) injection 2 mg (2 mg Intravenous Given 04/19/16 0615)  ondansetron (ZOFRAN) injection 4 mg (4 mg Intravenous Given 04/19/16 0613)  HYDROmorphone (DILAUDID) injection 3 mg (3 mg Intravenous Given 04/19/16 0641)  ondansetron (ZOFRAN) injection 4 mg (4 mg Intravenous Given 04/19/16 0642)    Filed Vitals:   04/19/16 0600 04/19/16 0630 04/19/16 0649 04/19/16 0700  BP: 123/78 122/71 122/71 118/77  Pulse: 59 65 66   Temp:      TempSrc:      Resp:   16   Height:      Weight:  SpO2: 97% 97% 99%     Final diagnoses:  Sickle cell anemia with crisis [facility])    Admission/ observation were discussed with the admitting physician, patient and/or family and they are comfortable with the plan.     Deno Etienne, DO 04/19/16 [phone]

## 2016-04-19 NOTE — H&P (Signed)
Hospital Admission Note Date: 04/19/2016  Patient name: Carmen [name] Medical record number: [id number] Date of birth: 08-18-1991 Age: 25 y.o. 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 Pt states that she took her percocet and MS Contin as prescribed but was unable to control her pain. Pt states that she last took medications 2 weeks ago as she only takes her opiate medication when she has pain. She describes the pain as 10/10, non-radiating, throbbing and intermittently sharp in nature. The pain is not associated with any other symptoms. Incidentally patient was treated with Amoxicillin for a presumed dental abscess which presented with swelling of the jaw but was not confirmed with any radiological studies. She has not been seen by a Dentist. Pt states that since being treated she has been able to eat without difficulty.  She denies HA, SOB, Cough, F/C, N/V/D. In the ED she received 5 doses of Dilaudid and one dose of Toradol with no significant relief in pain. She is admitted for Hb SS with crisis.  In the ED she has received 4 doses of Dilaudid and 1 dose of Toradol and she still reports her pain as 10/10. I am asked to admit patient for acute sickle cell crisis.   Scheduled Meds: .  HYDROmorphone (DILAUDID) injection  0.0375 mg/kg Intravenous Q1H   Or  .  HYDROmorphone (DILAUDID) injection  0.0375 mg/kg Subcutaneous Q1H  . ketorolac  30 mg Intravenous Q6H   Continuous Infusions:   PRN Meds:. Allergies: Review of patient's allergies indicates no known allergies. Past Medical History  Diagnosis Date  . Sickle cell crisis ([name])   . Headache(784.0)   . Abortion     05/2012    Past Surgical History  Procedure Laterality Date  . Splenectomy    . Cholecystectomy N/A 11/30/2014    Procedure: LAPAROSCOPIC CHOLECYSTECTOMY SINGLE SITE WITH INTRAOPERATIVE CHOLANGIOGRAM;  Surgeon: Michael Boston, MD;  Location: [facility];  Service: General;  Laterality: N/A;   Family History  Problem Relation Age of Onset  . Hypertension Mother   . Sickle cell anemia Sister   . Kidney disease Sister     Lupus  . Arthritis Sister   . Sickle cell anemia Sister   . Sickle cell trait Sister   . Heart disease Maternal Aunt   . Heart disease Maternal Uncle    Social History   Social History  . Marital Status: Single    Spouse Name: N/A  . Number of Children: N/A  . Years of Education: N/A   Occupational History  . Not on file.   Social History Main Topics  . Smoking status: Never Smoker   . Smokeless tobacco: Never Used  . Alcohol Use: No  . Drug Use: No  . Sexual Activity: Yes    Birth Control/ Protection: None   Other Topics Concern  . Not on file   Social History Narrative   Review of Systems: A comprehensive review of systems was negative except as noted in the HPI.  Physical Exam: No intake or output data in the 24 hours ending 04/19/16 0939 General: Alert, awake, oriented x3, in moderate distress secondary to pain.Marland Kitchen  HEENT: [place]/AT PEERL, EOMI,  anicteric Neck: Trachea midline,  no masses, no thyromegal,y no JVD, no carotid bruit OROPHARYNX:  Moist, No exudate/ erythema/lesions.  Heart: Regular rate and rhythm, without murmurs, rubs, gallops, PMI non-displaced, no heaves or thrills on palpation.  Lungs: Clear to auscultation, no wheezing or rhonchi noted. No increased vocal fremitus resonant to percussion.  Abdomen: Soft, nontender, nondistended, positive bowel sounds, no masses no hepatosplenomegaly noted.  Neuro: No focal neurological deficits noted cranial nerves II through XII grossly intact.  Strength at functional baseline in bilateral upper and lower  extremities. Musculoskeletal: No warmth swelling or erythema around joints, no spinal tenderness noted. Psychiatric: Patient alert and oriented x3, good insight and cognition, good recent to remote recall.   Lab results:  Recent Labs  04/19/16 0528  NA 140  K 3.6  CL 111  CO2 23  GLUCOSE 94  BUN 11  CREATININE 0.43*  CALCIUM 9.5    Recent Labs  04/19/16 0528  AST 19  ALT 14  ALKPHOS 51  BILITOT 1.9*  PROT 7.7  ALBUMIN 4.5   No results for input(s): LIPASE, AMYLASE in the last 72 hours.  Recent Labs  04/19/16 0528  WBC 10.6*  NEUTROABS 5.4  HGB 9.4*  HCT 26.4*  MCV 82.5  PLT 627*   No results for input(s): CKTOTAL, CKMB, CKMBINDEX, TROPONINI in the last 72 hours. Invalid input(s): POCBNP No results for input(s): DDIMER in the last 72 hours. No results for input(s): HGBA1C in the last 72 hours. No results for input(s): CHOL, HDL, LDLCALC, TRIG, CHOLHDL, LDLDIRECT in the last 72 hours. No results for input(s): TSH, T4TOTAL, T3FREE, THYROIDAB in the last 72 hours.  Invalid input(s): [place]  04/19/16 0528  RETICCTPCT 7.1*   Imaging results:  No results found. Assessment and Plan: 1. Hb SS with Crisis: Will start patient on Dilaudid via PCA, Toradol and IVF. Continue MS Contin. 2. Anemia of Chronic Disease: Hb currently mildly above baseline but is likely a reflection of hemoconcentration. 3. Mild Dehydration: Continue hydration with hypotonic saline due to her crisis. 4. Thrombocytosis: Likely reactive. 5. Leukocytosis: Pt has a mild elevation of WBC which is likely reactive in nature.  Time spent 1 hour   Zakery Normington A. 04/19/2016, 9:39 AM

## 2016-04-20 NOTE — Progress Notes (Signed)
SICKLE CELL SERVICE PROGRESS NOTE  Carmen [name] [id number] DOB: 06/11/1991 DOA: 04/19/2016 PCP: [name], MD  Assessment/Plan: Active Problems:   Sickle cell pain crisis ([place])   Sickle cell crisis ([place])   1. Hb SS with crisis: Pt reports that pain minimally decreased. She has had only minimal use of the PCA. Will continue PCA at current dose, Toradol and clinicin assisted doses of Dilaudid.Continue IVF for now as oral intake minimal.  2. Leukocytosis: Likely related to crisis. No signs of infection.  3. Anemia of Chronic Disease:Will re check Hb tomorrow.     Code Status: Full Code Family Communication: N/A Disposition Plan: Not yet ready for discharge  Carmen [name].  Pager [phone]. If 7PM-7AM, please contact night-coverage.  04/20/2016, 1:03 PM  LOS: 1 day   Interim History: Pt reports that the pain is unchanged at an intensity of 9/10 and still localized to shoulders, back, arms and legs. She has used only minimal amounts of Dilaudid on the PCA.  Reports that she had a BM last night.  Consultants:  None  Procedures:  None  Antibiotics:  None   Objective: Filed Vitals:   04/20/16 0657 04/20/16 0800 04/20/16 0943 04/20/16 1223  BP: 107/68  106/57   Pulse: 62  50   Temp: 97.7 F (36.5 C)  97.6 F (36.4 C)   TempSrc: Oral  Oral   Resp: 13 11 12 11   Height:      Weight:      SpO2: 100% 95% 100% 100%   Weight change: 7.8 oz (0.221 kg)  Intake/Output Summary (Last 24 hours) at 04/20/16 1303 Last data filed at 04/20/16 1100  Gross per 24 hour  Intake 2742.83 ml  Output   1751 ml  Net 991.83 ml    General: Alert, awake, oriented x3, in minimal distress due to pain.  HEENT: [place]/AT PEERL, EOMI, anicteric. Neck: Trachea midline,  no masses, no thyromegal,y no JVD, no carotid bruit OROPHARYNX:  Moist, No exudate/ erythema/lesions.  Heart: Regular rate and rhythm, without murmurs, rubs, gallops, PMI non-displaced, no heaves or thrills on  palpation.  Lungs: Clear to auscultation, no wheezing or rhonchi noted. No increased vocal fremitus resonant to percussion  Abdomen: Soft, nontender, nondistended, positive bowel sounds, no masses no hepatosplenomegaly noted.  Neuro: No focal neurological deficits noted cranial nerves II through XII grossly intact.  Strength at functional baseline in bilateral upper and lower extremities. Musculoskeletal: No warmth swelling or erythema around joints, no spinal tenderness noted. Psychiatric: Patient alert and oriented x3, good insight and cognition, good recent to remote recall.    Data Reviewed: Basic Metabolic Panel:  Recent Labs Lab 04/19/16 0528  NA 140  K 3.6  CL 111  CO2 23  GLUCOSE 94  BUN 11  CREATININE 0.43*  CALCIUM 9.5   Liver Function Tests:  Recent Labs Lab 04/19/16 0528  AST 19  ALT 14  ALKPHOS 51  BILITOT 1.9*  PROT 7.7  ALBUMIN 4.5   No results for input(s): LIPASE, AMYLASE in the last 168 hours. No results for input(s): AMMONIA in the last 168 hours. CBC:  Recent Labs Lab 04/19/16 0528  WBC 10.6*  NEUTROABS 5.4  HGB 9.4*  HCT 26.4*  MCV 82.5  PLT 627*   Cardiac Enzymes: No results for input(s): CKTOTAL, CKMB, CKMBINDEX, TROPONINI in the last 168 hours. BNP (last 3 results) No results for input(s): BNP in the last 8760 hours.  ProBNP (last 3 results) No results for input(s): PROBNP in  the last 8760 hours.  CBG: No results for input(s): GLUCAP in the last 168 hours.  No results found for this or any previous visit (from the past 240 hour(s)).   Studies: No results found.  Scheduled Meds: . enoxaparin (LOVENOX) injection  40 mg Subcutaneous Q24H  . HYDROmorphone   Intravenous Q4H  .  HYDROmorphone (DILAUDID) injection  2 mg Intravenous Q3H  . ketorolac  30 mg Intravenous Q6H  . morphine  60 mg Oral Q12H  . senna-docusate  1 tablet Oral BID   Continuous Infusions: . dextrose 5 % and 0.45% NaCl 100 mL/hr at 04/20/16 0543    Time  spent 25 miunutes

## 2016-04-21 LAB — CBC WITH DIFFERENTIAL/PLATELET
BASOS ABS: 0 10*3/uL (ref 0.0–0.1)
Basophils Relative: 0 %
EOS PCT: 5 %
Eosinophils Absolute: 0.5 10*3/uL (ref 0.0–0.7)
HEMATOCRIT: 23.3 % — AB (ref 36.0–46.0)
HEMOGLOBIN: 8.5 g/dL — AB (ref 12.0–15.0)
LYMPHS PCT: 50 %
Lymphs Abs: 4.6 10*3/uL — ABNORMAL HIGH (ref 0.7–4.0)
MCH: 29.6 pg (ref 26.0–34.0)
MCHC: 36.5 g/dL — ABNORMAL HIGH (ref 30.0–36.0)
MCV: 81.2 fL (ref 78.0–100.0)
Monocytes Absolute: 1 10*3/uL (ref 0.1–1.0)
Monocytes Relative: 12 %
NEUTROS ABS: 3 10*3/uL (ref 1.7–7.7)
NEUTROS PCT: 33 %
PLATELETS: 496 10*3/uL — AB (ref 150–400)
RBC: 2.87 MIL/uL — AB (ref 3.87–5.11)
RDW: 15.7 % — ABNORMAL HIGH (ref 11.5–15.5)
WBC: 9.1 10*3/uL (ref 4.0–10.5)

## 2016-04-21 NOTE — Care Management Note (Signed)
Case Management Note  Patient Details  Name: Carmen [name] MRN: [id number] Date of Birth: 1990/11/20  Subjective/Objective:     25 yo admitted with [facility]               Action/Plan: From home with significant other.  Expected Discharge Date:   (unknown)               Expected Discharge Plan:  Home/Self Care  In-House Referral:     Discharge planning Services  CM Consult  Post Acute Care Choice:    Choice offered to:     DME Arranged:    DME Agency:     HH Arranged:    HH Agency:     Status of Service:  In process, will continue to follow  If discussed at Long Length of Stay Meetings, dates discussed:    Additional CommentsLynnell Catalan, RN 04/21/2016, 11:13 AM  [phone]

## 2016-04-21 NOTE — Progress Notes (Signed)
Patients O2 sats are 99%-98% on room air. Patient informed that MD wants ambulatory sats if sats are greater than 90%. Patient states that she does not want to walk now , she wants to wait  until tonight.

## 2016-04-21 NOTE — Progress Notes (Signed)
SICKLE CELL SERVICE PROGRESS NOTE  Carmen [name] [id number] DOB: 06-09-1991 DOA: 04/19/2016 PCP: [name], MD  Assessment/Plan: Active Problems:   Sickle cell pain crisis ([name])   Sickle cell crisis ([name])   1. Hb SS with crisis: Pt reports that pain minimally decreased. She has used only 28.5 mg with 63/57: demands/deliveries in addition to 16 mg in the last 24 hours. Continue Dilaudid via PCA and Toradol.Decrease IVF  To KVO.   2. Leukocytosis: Likely related to crisis. No signs of infection.  3. Anemia of Chronic Disease:Will re check Hb tomorrow.     Code Status: Full Code Family Communication: N/A Disposition Plan: Not yet ready for discharge  [place].  Pager [phone]. If 7PM-7AM, please contact night-coverage.  04/21/2016, 1:48 PM  LOS: 2 days   Interim History: Pt reports that the pain is only slightly improved at 8/10 and still localized to shoulders, back, arms and legs. She has used only minimal amounts of Dilaudid on the PCA.  Reports that she had a BM last night.  Consultants:  None  Procedures:  None  Antibiotics:  None   Objective: Filed Vitals:   04/21/16 0529 04/21/16 0831 04/21/16 1030 04/21/16 1134  BP: 96/49  128/80   Pulse: 65  74   Temp: 98.2 F (36.8 C)  98 F (36.7 C)   TempSrc: Oral  Oral   Resp:  12 14 12   Height:      Weight:      SpO2: 100% 100% 100% 100%   Weight change:   Intake/Output Summary (Last 24 hours) at 04/21/16 1348 Last data filed at 04/21/16 0641  Gross per 24 hour  Intake 1995.33 ml  Output   1200 ml  Net 795.33 ml    General: Alert, awake, oriented x3, in minimal distress due to pain.  HEENT: [place]/AT PEERL, EOMI, anicteric. Neck: Trachea midline,  no masses, no thyromegal,y no JVD, no carotid bruit OROPHARYNX:  Moist, No exudate/ erythema/lesions.  Heart: Regular rate and rhythm, without murmurs, rubs, gallops, PMI non-displaced, no heaves or thrills on palpation.  Lungs: Clear to  auscultation, no wheezing or rhonchi noted. No increased vocal fremitus resonant to percussion  Abdomen: Soft, nontender, nondistended, positive bowel sounds, no masses no hepatosplenomegaly noted.  Neuro: No focal neurological deficits noted cranial nerves II through XII grossly intact.  Strength at functional baseline in bilateral upper and lower extremities. Musculoskeletal: No warmth swelling or erythema around joints, no spinal tenderness noted. Psychiatric: Patient alert and oriented x3, good insight and cognition, good recent to remote recall.    Data Reviewed: Basic Metabolic Panel:  Recent Labs Lab 04/19/16 0528  NA 140  K 3.6  CL 111  CO2 23  GLUCOSE 94  BUN 11  CREATININE 0.43*  CALCIUM 9.5   Liver Function Tests:  Recent Labs Lab 04/19/16 0528  AST 19  ALT 14  ALKPHOS 51  BILITOT 1.9*  PROT 7.7  ALBUMIN 4.5   No results for input(s): LIPASE, AMYLASE in the last 168 hours. No results for input(s): AMMONIA in the last 168 hours. CBC:  Recent Labs Lab 04/19/16 0528 04/21/16 0554  WBC 10.6* 9.1  NEUTROABS 5.4 3.0  HGB 9.4* 8.5*  HCT 26.4* 23.3*  MCV 82.5 81.2  PLT 627* 496*   Cardiac Enzymes: No results for input(s): CKTOTAL, CKMB, CKMBINDEX, TROPONINI in the last 168 hours. BNP (last 3 results) No results for input(s): BNP in the last 8760 hours.  ProBNP (last 3 results) No  results for input(s): PROBNP in the last 8760 hours.  CBG: No results for input(s): GLUCAP in the last 168 hours.  No results found for this or any previous visit (from the past 240 hour(s)).   Studies: No results found.  Scheduled Meds: . enoxaparin (LOVENOX) injection  40 mg Subcutaneous Q24H  . HYDROmorphone   Intravenous Q4H  . ketorolac  30 mg Intravenous Q6H  . morphine  60 mg Oral Q12H  . senna-docusate  1 tablet Oral BID   Continuous Infusions: . dextrose 5 % and 0.45% NaCl 10 mL/hr (04/21/16 1219)    Time spent 25 miunutes

## 2016-04-22 MED ORDER — HYDROMORPHONE HCL 2 MG/ML IJ SOLN
2.0000 mg | INTRAMUSCULAR | Status: DC | PRN
  Administered 2016-04-23 (×3): 2 mg via INTRAVENOUS
  Filled 2016-04-22 (×3): qty 1

## 2016-04-22 NOTE — Progress Notes (Signed)
SICKLE CELL SERVICE PROGRESS NOTE  Carmen [name] [id number] DOB: 10/02/1991 DOA: 04/19/2016 PCP: [name], MD  Assessment/Plan: Active Problems:   Sickle cell pain crisis ([place])   Sickle cell crisis ([place])   1. Hb SS with crisis: Pt reports that pain minimally decreased. She has used only 28.5 mg with 63/57: demands/deliveries in addition to 16 mg in the last 24 hours. Continue Dilaudid via PCA, based on the current usage I will decrease the frequency of clinician assisted doses and continue Toradol. Continue [facility] at [facility].   2. Leukocytosis: Was likely related to crisis. Resolved Anemia of Chronic Disease: Hemoglobin remains at baseline.     Code Status: Full Code Family Communication: N/A Disposition Plan: Not yet ready for discharge  [place].  Pager [phone]. If 7PM-7AM, please contact night-coverage.  04/22/2016, 4:12 PM  LOS: 3 days   Interim History: Pt is still reporting her pain as an intensity of  8/10 and still localized to shoulders, back, arms and legs. She has used 28.5 mg with 63/67: Demands deliveries in addition to 6 mg of Dilaudid by IV push the last 24 hours. Reports that she had a BM this morning and had one episode of emesis after lunch..  Consultants:  None  Procedures:  None  Antibiotics:  None   Objective: Filed Vitals:   04/22/16 0358 04/22/16 0820 04/22/16 1049 04/22/16 1239  BP: 121/73  130/63   Pulse: 79  74   Temp: 98.5 F (36.9 C)  98.6 F (37 C)   TempSrc: Oral  Oral   Resp: 14 12 16 16   Height:      Weight: 178 lb 9.2 oz (81 kg)     SpO2: 100% 100% 95% 95%   Weight change:   Intake/Output Summary (Last 24 hours) at 04/22/16 1612 Last data filed at 04/22/16 0428  Gross per 24 hour  Intake      0 ml  Output   2000 ml  Net  -2000 ml    General: Alert, awake, oriented x3, in minimal distress due to pain.  HEENT: [place]/AT PEERL, EOMI, anicteric. Neck: Trachea midline,  no masses, no thyromegal,y no JVD, no  carotid bruit OROPHARYNX:  Moist, No exudate/ erythema/lesions.  Heart: Regular rate and rhythm, without murmurs, rubs, gallops, PMI non-displaced, no heaves or thrills on palpation.  Lungs: Clear to auscultation, no wheezing or rhonchi noted. No increased vocal fremitus resonant to percussion  Abdomen: Soft, nontender, nondistended, positive bowel sounds, no masses no hepatosplenomegaly noted.  Neuro: No focal neurological deficits noted cranial nerves II through XII grossly intact.  Strength at functional baseline in bilateral upper and lower extremities. Musculoskeletal: No warmth swelling or erythema around joints, no spinal tenderness noted. Psychiatric: Patient alert and oriented x3, good insight and cognition, good recent to remote recall.    Data Reviewed: Basic Metabolic Panel:  Recent Labs Lab 04/19/16 0528  NA 140  K 3.6  CL 111  CO2 23  GLUCOSE 94  BUN 11  CREATININE 0.43*  CALCIUM 9.5   Liver Function Tests:  Recent Labs Lab 04/19/16 0528  AST 19  ALT 14  ALKPHOS 51  BILITOT 1.9*  PROT 7.7  ALBUMIN 4.5   No results for input(s): LIPASE, AMYLASE in the last 168 hours. No results for input(s): AMMONIA in the last 168 hours. CBC:  Recent Labs Lab 04/19/16 0528 04/21/16 0554  WBC 10.6* 9.1  NEUTROABS 5.4 3.0  HGB 9.4* 8.5*  HCT 26.4* 23.3*  MCV 82.5  81.2  PLT 627* 496*   Cardiac Enzymes: No results for input(s): CKTOTAL, CKMB, CKMBINDEX, TROPONINI in the last 168 hours. BNP (last 3 results) No results for input(s): BNP in the last 8760 hours.  ProBNP (last 3 results) No results for input(s): PROBNP in the last 8760 hours.  CBG: No results for input(s): GLUCAP in the last 168 hours.  No results found for this or any previous visit (from the past 240 hour(s)).   Studies: No results found.  Scheduled Meds: . enoxaparin (LOVENOX) injection  40 mg Subcutaneous Q24H  . HYDROmorphone   Intravenous Q4H  . ketorolac  30 mg Intravenous Q6H  .  morphine  60 mg Oral Q12H  . senna-docusate  1 tablet Oral BID   Continuous Infusions: . dextrose 5 % and 0.45% NaCl 10 mL/hr at 04/22/16 [id number]    Time spent 25 miunutes

## 2016-04-23 DIAGNOSIS — R42 Dizziness and giddiness: Secondary | ICD-10-CM

## 2016-04-23 DIAGNOSIS — R112 Nausea with vomiting, unspecified: Secondary | ICD-10-CM

## 2016-04-23 MED ORDER — HYDROMORPHONE HCL 2 MG/ML IJ SOLN
2.0000 mg | INTRAMUSCULAR | Status: DC | PRN
  Administered 2016-04-23 – 2016-04-24 (×5): 2 mg via SUBCUTANEOUS
  Filled 2016-04-23 (×6): qty 1

## 2016-04-23 MED ORDER — OXYCODONE HCL 5 MG PO TABS
5.0000 mg | ORAL_TABLET | ORAL | Status: DC
  Administered 2016-04-23 – 2016-04-26 (×19): 5 mg via ORAL
  Filled 2016-04-23 (×19): qty 1

## 2016-04-23 MED ORDER — HYDROMORPHONE HCL 1 MG/ML IJ SOLN
1.0000 mg | INTRAMUSCULAR | Status: DC | PRN
  Administered 2016-04-23: 1 mg via SUBCUTANEOUS
  Filled 2016-04-23 (×2): qty 1

## 2016-04-23 MED ORDER — OXYCODONE-ACETAMINOPHEN 5-325 MG PO TABS
1.0000 | ORAL_TABLET | ORAL | Status: DC
  Administered 2016-04-23 – 2016-04-26 (×19): 1 via ORAL
  Filled 2016-04-23 (×19): qty 1

## 2016-04-23 NOTE — Progress Notes (Signed)
PT AMBULATED AROUND UNIT TWICE WITH SIGNIFICANT OTHER. PT RESP. STATED IN BETWEEN 15-17 AND O2 WAS 96%-97%the patient. PT STATES THAT AFTER WALK SHE HAD PAIN IN LEGS AND BACK AND RATED PAIN 9/10

## 2016-04-23 NOTE — Progress Notes (Signed)
Patient lost IV access.  Requesting pain medication until able to have IV access restored.  1 mg SQ Dilaudid q2h ordered.  Order may be discontinued once patient has IV access restored.  Carlyon Shadow, M.D. [facility]

## 2016-04-23 NOTE — Progress Notes (Signed)
SICKLE CELL SERVICE PROGRESS NOTE  Carmen [name] [id number] DOB: 08-13-1991 DOA: 04/19/2016 PCP: [name], MD  Assessment/Plan: Active Problems:   Sickle cell pain crisis ([place])   Sickle cell crisis ([place])   1. Hb SS with crisis: Pt reports that pain minimally decreased. She has used only 25 mg with 51/50: demands/deliveries in addition to 6 mg in the last 24 hours. Continue Dilaudid via PCA, to be used on a PRN basis. I will discontinue clinician assisted doses and schedule oral Dilaudid. Continue Toradol. [facility] at [facility].   2. Nausea and Emesis: Pt reports that she has been feeling nauseous and had several episodes of emesis. However, this was not observed and nursing is not aware of volumes. I have asked her to vomit into the barf bag so that we may have a better idea if vomitus. She has been able to tolerate oral intake and medications today. Will continue to  Monitor.  3. Dizziness: Pt c/o dizziness: Will check orthostatics.   4. Leukocytosis: Was likely related to crisis. Resolved Anemia of Chronic Disease: Hemoglobin remains at baseline.     Code Status: Full Code Family Communication: N/A Disposition Plan:Anticipate discharge in next 24-48 hours.  MATTHEWS,MICHELLE A.  Pager [phone]. If 7PM-7AM, please contact night-coverage.  04/23/2016, 4:16 PM  LOS: 4 days   Interim History: Pt is still reporting her pain as an intensity of  8/10 and still localized to shoulders, back, arms and legs. She has used 25 mg with 51/50: Demands deliveries in addition to 6 mg of Dilaudid by IV push the last 24 hours. Reports that she had a BM this morning and had one episode of emesis after lunch..  Consultants:  None  Procedures:  None  Antibiotics:  None   Objective: Filed Vitals:   04/23/16 1019 04/23/16 1021 04/23/16 1300 04/23/16 1420  BP: 127/86   115/68  Pulse: 80   91  Temp: 98.8 F (37.1 C)   98.4 F (36.9 C)  TempSrc: Oral   Oral  Resp: 11 12 18 11    Height:      Weight:      SpO2: 97% 98% 91% 99%   Weight change:   Intake/Output Summary (Last 24 hours) at 04/23/16 1616 Last data filed at 04/23/16 1019  Gross per 24 hour  Intake    240 ml  Output      0 ml  Net    240 ml    General: Alert, awake, oriented x3, in minimal distress due to pain.  HEENT: [place]/AT PEERL, EOMI, anicteric. Neck: Trachea midline,  no masses, no thyromegal,y no JVD, no carotid bruit OROPHARYNX:  Moist, No exudate/ erythema/lesions.  Heart: Regular rate and rhythm, without murmurs, rubs, gallops, PMI non-displaced, no heaves or thrills on palpation.  Lungs: Clear to auscultation, no wheezing or rhonchi noted. No increased vocal fremitus resonant to percussion  Abdomen: Soft, nontender, nondistended, positive bowel sounds, no masses no hepatosplenomegaly noted.  Neuro: No focal neurological deficits noted cranial nerves II through XII grossly intact.  Strength at functional baseline in bilateral upper and lower extremities. Musculoskeletal: No warmth swelling or erythema around joints, no spinal tenderness noted. Psychiatric: Patient alert and oriented x3, good insight and cognition, good recent to remote recall.    Data Reviewed: Basic Metabolic Panel:  Recent Labs Lab 04/19/16 0528  NA 140  K 3.6  CL 111  CO2 23  GLUCOSE 94  BUN 11  CREATININE 0.43*  CALCIUM 9.5   Liver  Function Tests:  Recent Labs Lab 04/19/16 0528  AST 19  ALT 14  ALKPHOS 51  BILITOT 1.9*  PROT 7.7  ALBUMIN 4.5   No results for input(s): LIPASE, AMYLASE in the last 168 hours. No results for input(s): AMMONIA in the last 168 hours. CBC:  Recent Labs Lab 04/19/16 0528 04/21/16 0554  WBC 10.6* 9.1  NEUTROABS 5.4 3.0  HGB 9.4* 8.5*  HCT 26.4* 23.3*  MCV 82.5 81.2  PLT 627* 496*   Cardiac Enzymes: No results for input(s): CKTOTAL, CKMB, CKMBINDEX, TROPONINI in the last 168 hours. BNP (last 3 results) No results for input(s): BNP in the last 8760  hours.  ProBNP (last 3 results) No results for input(s): PROBNP in the last 8760 hours.  CBG: No results for input(s): GLUCAP in the last 168 hours.  No results found for this or any previous visit (from the past 240 hour(s)).   Studies: No results found.  Scheduled Meds: . enoxaparin (LOVENOX) injection  40 mg Subcutaneous Q24H  . HYDROmorphone   Intravenous Q4H  . ketorolac  30 mg Intravenous Q6H  . morphine  60 mg Oral Q12H  . oxyCODONE-acetaminophen  1 tablet Oral Q4H   And  . oxyCODONE  5 mg Oral Q4H  . senna-docusate  1 tablet Oral BID   Continuous Infusions: . dextrose 5 % and 0.45% NaCl 10 mL/hr at 04/22/16 [id number]    Time spent 25 miunutes

## 2016-04-23 NOTE — Progress Notes (Signed)
2 night shift IV team RNs attempted to get IV access without success. Pt reported [place] dilaudid 1mg  was ineffective. Paged on call NP and order adjusted to 2mg  q2h prn. Continue to monitor. Hortencia Conradi RN

## 2016-04-24 NOTE — Progress Notes (Signed)
Pt agreeable to attempt PIV placement vs PICC placement due to potential discharge home within 24 hours. 20G 1.88" PIV obtained via ultrasound. Dr.[name] notified via telephone of intervention and new order to d/c PICC line.  Thank you for the referral.

## 2016-04-24 NOTE — Progress Notes (Signed)
Patient ID: Carmen [name], female   DOB: 03-29-1991, 25 y.o.   MRN: [id number] Subjective:  Patient still experiencing significant pain, lost IV site last night and all attempts to secure new IV site unsuccessful. Patient claimed the current subcut regimen is not helping her pain. Vomiting resolved but still slight nausea +. She has no fever, no cough, no SOB, no chest pain.  Objective:  Vital signs in last 24 hours:  Filed Vitals:   04/23/16 1725 04/23/16 2155 04/24/16 0700 04/24/16 0940  BP: 120/80 130/84  114/66  Pulse: 82 92  90  Temp: 98.5 F (36.9 C) 98.8 F (37.1 C)  98.6 F (37 C)  TempSrc: Oral Oral  Oral  Resp: 12 16  18   Height:      Weight:   82.2 kg (181 lb 3.5 oz)   SpO2: 98% 98%  98%   Intake/Output from previous day:  Intake/Output Summary (Last 24 hours) at 04/24/16 1133 Last data filed at 04/24/16 0946  Gross per 24 hour  Intake      0 ml  Output    800 ml  Net   -800 ml   Physical Exam: General: Alert, awake, oriented x3, in no acute distress.  HEENT: [place]/AT PEERL, EOMI Neck: Trachea midline,  no masses, no thyromegal,y no JVD, no carotid bruit OROPHARYNX:  Moist, No exudate/ erythema/lesions.  Heart: Regular rate and rhythm, without murmurs, rubs, gallops, PMI non-displaced, no heaves or thrills on palpation.  Lungs: Clear to auscultation, no wheezing or rhonchi noted. No increased vocal fremitus resonant to percussion  Abdomen: Soft, nontender, nondistended, positive bowel sounds, no masses no hepatosplenomegaly noted..  Neuro: No focal neurological deficits noted cranial nerves II through XII grossly intact. DTRs 2+ bilaterally upper and lower extremities. Strength 5 out of 5 in bilateral upper and lower extremities. Musculoskeletal: No warm swelling or erythema around joints, no spinal tenderness noted. Psychiatric: Patient alert and oriented x3, good insight and cognition, good recent to remote recall. Lymph node survey: No cervical axillary or  inguinal lymphadenopathy noted.  Lab Results:  Basic Metabolic Panel:    Component Value Date/Time   NA 140 04/19/2016 0528   K 3.6 04/19/2016 0528   CL 111 04/19/2016 0528   CO2 23 04/19/2016 0528   BUN 11 04/19/2016 0528   CREATININE 0.43* 04/19/2016 0528   GLUCOSE 94 04/19/2016 0528   CALCIUM 9.5 04/19/2016 0528   CBC:    Component Value Date/Time   WBC 9.1 04/21/2016 0554   HGB 8.5* 04/21/2016 0554   HCT 23.3* 04/21/2016 0554   PLT 496* 04/21/2016 0554   MCV 81.2 04/21/2016 0554   NEUTROABS 3.0 04/21/2016 0554   LYMPHSABS 4.6* 04/21/2016 0554   MONOABS 1.0 04/21/2016 0554   EOSABS 0.5 04/21/2016 0554   BASOSABS 0.0 04/21/2016 0554    No results found for this or any previous visit (from the past 240 hour(s)).  Studies/Results: No results found.  Medications: Scheduled Meds: . enoxaparin (LOVENOX) injection  40 mg Subcutaneous Q24H  . HYDROmorphone   Intravenous Q4H  . morphine  60 mg Oral Q12H  . oxyCODONE-acetaminophen  1 tablet Oral Q4H   And  . oxyCODONE  5 mg Oral Q4H  . senna-docusate  1 tablet Oral BID   Continuous Infusions: . dextrose 5 % and 0.45% NaCl 10 mL/hr at 04/22/16 0627   PRN Meds:.diphenhydrAMINE **OR** [DISCONTINUED] diphenhydrAMINE (BENADRYL) IVPB(SICKLE CELL ONLY), HYDROmorphone (DILAUDID) injection, naloxone **AND** sodium chloride flush, ondansetron (ZOFRAN) IV, polyethylene glycol  Consultants:  None  Procedures:  None  Antibiotics:  None  Assessment/Plan: Active Problems:   Sickle cell pain crisis ([place])   Sickle cell crisis ([place])  1. Hb SS with crisis: Pt said her pain has increased. She lost IV site yesterday and has since been on subcut dilaudid. Patient reports this is not helping. 2 unsuccessful attempts to re-establish IV. Will order insertion of PICC line Vs Ultrasound guided PIV site and continue Dilaudid via PCA, to be used on a PRN basis. Continue Toradol. Continue IVF at [facility].  2. Nausea and Emesis: Resolved.  Will continue to Monitor.  3. Dizziness: Pt c/o dizziness, orthostatic vitals negative 4. Leukocytosis: likely related to crisis. Resolved Anemia of Chronic Disease: Hemoglobin remains at baseline.  Code Status: Full Code Family Communication: N/A Disposition Plan: Possible discharge tomorrow  Itzae Mccurdy  If 7PM-7AM, please contact night-coverage.  04/24/2016, 11:33 AM  LOS: 5 days

## 2016-04-25 LAB — CBC WITH DIFFERENTIAL/PLATELET
BASOS ABS: 0 10*3/uL (ref 0.0–0.1)
Basophils Relative: 0 %
EOS PCT: 2 %
Eosinophils Absolute: 0.2 10*3/uL (ref 0.0–0.7)
HCT: 22.8 % — ABNORMAL LOW (ref 36.0–46.0)
Hemoglobin: 8.1 g/dL — ABNORMAL LOW (ref 12.0–15.0)
LYMPHS PCT: 41 %
Lymphs Abs: 4.4 10*3/uL — ABNORMAL HIGH (ref 0.7–4.0)
MCH: 29.1 pg (ref 26.0–34.0)
MCHC: 35.5 g/dL (ref 30.0–36.0)
MCV: 82 fL (ref 78.0–100.0)
MONO ABS: 1 10*3/uL (ref 0.1–1.0)
Monocytes Relative: 10 %
Neutro Abs: 5 10*3/uL (ref 1.7–7.7)
Neutrophils Relative %: 47 %
PLATELETS: 557 10*3/uL — AB (ref 150–400)
RBC: 2.78 MIL/uL — ABNORMAL LOW (ref 3.87–5.11)
RDW: 15.8 % — AB (ref 11.5–15.5)
WBC: 10.7 10*3/uL — ABNORMAL HIGH (ref 4.0–10.5)

## 2016-04-25 LAB — COMPREHENSIVE METABOLIC PANEL
ALT: 14 U/L (ref 14–54)
ANION GAP: 4 — AB (ref 5–15)
AST: 24 U/L (ref 15–41)
Albumin: 3.7 g/dL (ref 3.5–5.0)
Alkaline Phosphatase: 49 U/L (ref 38–126)
BUN: 5 mg/dL — ABNORMAL LOW (ref 6–20)
CHLORIDE: 103 mmol/L (ref 101–111)
CO2: 30 mmol/L (ref 22–32)
Calcium: 9 mg/dL (ref 8.9–10.3)
Creatinine, Ser: 0.44 mg/dL (ref 0.44–1.00)
Glucose, Bld: 105 mg/dL — ABNORMAL HIGH (ref 65–99)
POTASSIUM: 3.2 mmol/L — AB (ref 3.5–5.1)
Sodium: 137 mmol/L (ref 135–145)
Total Bilirubin: 3 mg/dL — ABNORMAL HIGH (ref 0.3–1.2)
Total Protein: 6.7 g/dL (ref 6.5–8.1)

## 2016-04-25 MED ORDER — POTASSIUM CHLORIDE CRYS ER 20 MEQ PO TBCR
40.0000 meq | EXTENDED_RELEASE_TABLET | Freq: Once | ORAL | Status: AC
  Administered 2016-04-25: 40 meq via ORAL
  Filled 2016-04-25: qty 2

## 2016-04-25 MED ORDER — POTASSIUM CHLORIDE CRYS ER 20 MEQ PO TBCR: 20.0000 meq | EXTENDED_RELEASE_TABLET | Freq: Once | ORAL | Status: DC

## 2016-04-25 NOTE — Progress Notes (Signed)
Patient ID: Carmen [name], female   DOB: Sep 15, 1991, 25 y.o.   MRN: [id number]  Subjective:  Patient still experiencing significant pain but with slight improvement since yesterday. She now has a PIV site, restarted on PCA Dilaudid late evening yesterday. Vomiting resolved but still slight nausea +. She has no fever, no cough, no SOB, no chest pain  Objective:  Vital signs in last 24 hours:  Filed Vitals:   04/25/16 0230 04/25/16 0332 04/25/16 0636 04/25/16 0719  BP:      Pulse: 92  86   Temp:      TempSrc:      Resp: 16 13 17 15   Height:      Weight:      SpO2: 100% 99% 98% 98%   Intake/Output from previous day:   Intake/Output Summary (Last 24 hours) at 04/25/16 0946 Last data filed at 04/25/16 0630  Gross per 24 hour  Intake 2540.17 ml  Output   1300 ml  Net 1240.17 ml   Physical Exam: General: Alert, awake, oriented x3, in no acute distress.  HEENT: [place]/AT PEERL, EOMI Neck: Trachea midline,  no masses, no thyromegal,y no JVD, no carotid bruit OROPHARYNX:  Moist, No exudate/ erythema/lesions.  Heart: Regular rate and rhythm, without murmurs, rubs, gallops, PMI non-displaced, no heaves or thrills on palpation.  Lungs: Clear to auscultation, no wheezing or rhonchi noted. No increased vocal fremitus resonant to percussion  Abdomen: Soft, nontender, nondistended, positive bowel sounds, no masses no hepatosplenomegaly noted..  Neuro: No focal neurological deficits noted cranial nerves II through XII grossly intact. DTRs 2+ bilaterally upper and lower extremities. Strength 5 out of 5 in bilateral upper and lower extremities. Musculoskeletal: No warm swelling or erythema around joints, no spinal tenderness noted. Psychiatric: Patient alert and oriented x3, good insight and cognition, good recent to remote recall. Lymph node survey: No cervical axillary or inguinal lymphadenopathy noted.  Lab Results:  Basic Metabolic Panel:    Component Value Date/Time   NA 137 04/25/2016  0401   K 3.2* 04/25/2016 0401   CL 103 04/25/2016 0401   CO2 30 04/25/2016 0401   BUN 5* 04/25/2016 0401   CREATININE 0.44 04/25/2016 0401   GLUCOSE 105* 04/25/2016 0401   CALCIUM 9.0 04/25/2016 0401   CBC:    Component Value Date/Time   WBC 10.7* 04/25/2016 0401   HGB 8.1* 04/25/2016 0401   HCT 22.8* 04/25/2016 0401   PLT 557* 04/25/2016 0401   MCV 82.0 04/25/2016 0401   NEUTROABS 5.0 04/25/2016 0401   LYMPHSABS 4.4* 04/25/2016 0401   MONOABS 1.0 04/25/2016 0401   EOSABS 0.2 04/25/2016 0401   BASOSABS 0.0 04/25/2016 0401    No results found for this or any previous visit (from the past 240 hour(s)).  Studies/Results: No results found.  Medications: Scheduled Meds: . enoxaparin (LOVENOX) injection  40 mg Subcutaneous Q24H  . HYDROmorphone   Intravenous Q4H  . morphine  60 mg Oral Q12H  . oxyCODONE-acetaminophen  1 tablet Oral Q4H   And  . oxyCODONE  5 mg Oral Q4H  . potassium chloride  40 mEq Oral Once  . senna-docusate  1 tablet Oral BID   Continuous Infusions: . dextrose 5 % and 0.45% NaCl 10 mL/hr at 04/24/16 1607   PRN Meds:.diphenhydrAMINE **OR** [DISCONTINUED] diphenhydrAMINE (BENADRYL) IVPB(SICKLE CELL ONLY), naloxone **AND** sodium chloride flush, ondansetron (ZOFRAN) IV, polyethylene glycol  Consultants:  None  Procedures:  None  Antibiotics:  None  Assessment/Plan: Active Problems:   Sickle cell  pain crisis ([place])   Sickle cell crisis ([place])  1. Hb SS with crisis: Pt said her pain improved slightly. She was restarted on PCA Dilaudid yesterday evening after losing her IV site, so pain is only slightly getting better. Continue Dilaudid via PCA. Completed Toradol. Continue [facility] at [facility].  2. Nausea and Emesis: Resolved. Will continue to Monitor.  3. Hypokalemia: Will replace, and check again 4. Dizziness: Improved. Orthostatic vitals negative 5. Leukocytosis: likely related to crisis. Resolved Anemia of Chronic Disease: Hemoglobin remains at  baseline.  Code Status: Full Code Family Communication: N/A Disposition Plan: For possible discharge later today or tomorrow morning  Envi Eagleson  If 7PM-7AM, please contact night-coverage.  04/25/2016, 9:46 AM  LOS: 6 days

## 2016-04-26 LAB — BASIC METABOLIC PANEL
Anion gap: 7 (ref 5–15)
CALCIUM: 9.2 mg/dL (ref 8.9–10.3)
CHLORIDE: 104 mmol/L (ref 101–111)
CO2: 26 mmol/L (ref 22–32)
CREATININE: 0.42 mg/dL — AB (ref 0.44–1.00)
GFR calc non Af Amer: >60 mL/min (ref >60)
Glucose, Bld: 99 mg/dL (ref 65–99)
POTASSIUM: 3.7 mmol/L (ref 3.5–5.1)
SODIUM: 137 mmol/L (ref 135–145)

## 2016-04-26 NOTE — Discharge Summary (Signed)
Physician Discharge Summary  Carmen [name] [id number] DOB: 27-Mar-1991 DOA: 04/19/2016  PCP: [name], MD  Admit date: 04/19/2016  Discharge date: 04/26/2016  Discharge Diagnoses:  Active Problems:   Sickle cell pain crisis ([place])   Sickle cell crisis [facility])  Discharge Condition: Stable  Disposition:  Follow-up Information    Follow up Today.      Follow up with [name], MD Today.   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 Patient states that she took her percocet and MS Contin as prescribed but was unable to control her pain. Pt states that she last took medications 2 weeks ago as she only takes her opiate medication when she has pain. She describes the pain as 10/10, non-radiating, throbbing and intermittently sharp in nature. The pain is not associated with any other symptoms. Incidentally patient was treated with Amoxicillin for a presumed dental abscess which presented with swelling of the jaw but was not confirmed with any radiological studies. She has not been seen by a Dentist. Pt states that since being treated she has been able to eat without difficulty. She denies HA, SOB, Cough, F/C, N/V/D. In the ED she received 5 doses of Dilaudid and one dose of Toradol with no significant relief in pain. She is admitted for Hb SS with crisis.  Hospital Course:  Patient was admitted for Sickle cell disease with crisis [facility]): Pt's pain was managed with IV Dilaudid, Toradol and IVF. As her pain improved,  she was transitioned over to oral analgesics. At the time of discharge, pain was well controlled with oral analgesics. She is discharged home on her pre-hospital regimen to follow up with her PCP today after discharge. Her Hb remained at baseline throughout hospital course. No transfusions required. Patient was discharged home in a hemodynamically stable condition.  Discharge Exam: Filed Vitals:   04/26/16 0437 04/26/16 0717  BP:    Pulse:    Temp:    Resp: 12 10   Filed Vitals:   04/25/16 2204 04/26/16 0410 04/26/16 0437 04/26/16 0717  BP: 123/74 106/56    Pulse: 77 83    Temp: 98.2 F (36.8 C) 98.9 F (37.2 C)    TempSrc: Oral Oral    Resp: 12 12 12 10   Height:      Weight:      SpO2: 98% 100% 99% 98%    General appearance : Awake, alert, not in any distress. Speech Clear. Not toxic looking HEENT: Atraumatic and Normocephalic, pupils equally reactive to light and accomodation Neck: Supple, no JVD. No cervical lymphadenopathy.  Chest: Good air entry bilaterally, no added sounds  CVS: S1 S2 regular, no murmurs.  Abdomen: Bowel sounds present, Non tender and not distended with no gaurding, rigidity or rebound. Extremities: B/L Lower Ext shows no edema, both legs are warm to touch Neurology: Awake alert, and oriented X 3, CN II-XII intact, Non focal Skin: No Rash  Discharge Instructions  Medication List    TAKE these medications        cyclobenzaprine 10 MG tablet  Commonly known as:  FLEXERIL  Take 1 tablet (10 mg total) by mouth 2 (two) times daily as needed for muscle spasms.     HYDROcodone-acetaminophen 5-325 MG tablet  Commonly known as:  NORCO/VICODIN  Take 2 tablets by mouth every 4 (four) hours as needed.     ibuprofen 800 MG tablet  Commonly known as:  ADVIL,MOTRIN  Take 1 tablet (800 mg total) by mouth 3 (three) times daily.     morphine 60 MG 12 hr tablet  Commonly known as:  MS CONTIN  Take 60 mg by mouth every 12 (twelve) hours.      oxyCODONE-acetaminophen 10-325 MG tablet  Commonly known as:  PERCOCET  Take 1 tablet by mouth every 6 (six) hours as needed. For pain       The results of significant diagnostics from this hospitalization (including imaging, microbiology, ancillary and laboratory) are listed below for reference.    Significant Diagnostic Studies: No results found.  Microbiology: No results found for this or any previous visit (from the past 240 hour(s)).   Labs: Basic Metabolic Panel:  Recent Labs Lab 04/25/16 0401 04/26/16 1004  NA 137 137  K 3.2* 3.7  CL 103 104  CO2 30 26  GLUCOSE 105* 99  BUN 5* <5*  CREATININE 0.44 0.42*  CALCIUM 9.0 9.2   Liver Function Tests:  Recent Labs Lab 04/25/16 0401  AST 24  ALT 14  ALKPHOS 49  BILITOT 3.0*  PROT 6.7  ALBUMIN 3.7   No results for input(s): LIPASE, AMYLASE in the last 168 hours. No results for input(s): AMMONIA in the last 168 hours. CBC:  Recent Labs Lab 04/21/16 0554 04/25/16 0401  WBC 9.1 10.7*  NEUTROABS 3.0 5.0  HGB 8.5* 8.1*  HCT 23.3* 22.8*  MCV 81.2 82.0  PLT 496* 557*   Cardiac Enzymes: No results for input(s): CKTOTAL, CKMB, CKMBINDEX, TROPONINI in the last 168 hours. BNP: Invalid input(s): POCBNP CBG: No results for input(s): GLUCAP in the last 168 hours.  Time coordinating discharge: 50 minutes  Signed:  Zoey Gilkeson, [facility] 04/26/2016, 11:39 AM

## 2016-05-14 ENCOUNTER — Emergency Department (HOSPITAL_COMMUNITY): Payer: Medicaid Other

## 2016-05-14 ENCOUNTER — Inpatient Hospital Stay (HOSPITAL_COMMUNITY)
Admission: EM | Admit: 2016-05-14 | Discharge: 2016-05-22 | DRG: 812 | Disposition: A | Discharge status: Home / Self Care | Payer: Medicaid Other | Attending: Internal Medicine | Admitting: Internal Medicine

## 2016-05-14 ENCOUNTER — Encounter (HOSPITAL_COMMUNITY): Payer: Self-pay | Admitting: Emergency Medicine

## 2016-05-14 DIAGNOSIS — D638 Anemia in other chronic diseases classified elsewhere: Secondary | ICD-10-CM | POA: Diagnosis not present

## 2016-05-14 DIAGNOSIS — Z9049 Acquired absence of other specified parts of digestive tract: Secondary | ICD-10-CM

## 2016-05-14 DIAGNOSIS — D473 Essential (hemorrhagic) thrombocythemia: Secondary | ICD-10-CM | POA: Diagnosis not present

## 2016-05-14 DIAGNOSIS — G894 Chronic pain syndrome: Secondary | ICD-10-CM | POA: Diagnosis not present

## 2016-05-14 DIAGNOSIS — K5900 Constipation, unspecified: Secondary | ICD-10-CM | POA: Diagnosis present

## 2016-05-14 DIAGNOSIS — Z832 Family history of diseases of the blood and blood-forming organs and certain disorders involving the immune mechanism: Secondary | ICD-10-CM

## 2016-05-14 DIAGNOSIS — Z9081 Acquired absence of spleen: Secondary | ICD-10-CM

## 2016-05-14 DIAGNOSIS — D72829 Elevated white blood cell count, unspecified: Secondary | ICD-10-CM | POA: Diagnosis present

## 2016-05-14 DIAGNOSIS — E876 Hypokalemia: Secondary | ICD-10-CM | POA: Diagnosis present

## 2016-05-14 DIAGNOSIS — D5700 Hb-SS disease with crisis, unspecified: Secondary | ICD-10-CM | POA: Diagnosis present

## 2016-05-14 DIAGNOSIS — D7589 Other specified diseases of blood and blood-forming organs: Secondary | ICD-10-CM | POA: Diagnosis present

## 2016-05-14 DIAGNOSIS — E860 Dehydration: Secondary | ICD-10-CM | POA: Diagnosis present

## 2016-05-14 DIAGNOSIS — D75839 Thrombocytosis, unspecified: Secondary | ICD-10-CM | POA: Diagnosis present

## 2016-05-14 LAB — COMPREHENSIVE METABOLIC PANEL
ALT: 19 U/L (ref 14–54)
AST: 25 U/L (ref 15–41)
Albumin: 4.8 g/dL (ref 3.5–5.0)
Alkaline Phosphatase: 53 U/L (ref 38–126)
Anion gap: 10 (ref 5–15)
BUN: 8 mg/dL (ref 6–20)
CO2: 22 mmol/L (ref 22–32)
Calcium: 9.8 mg/dL (ref 8.9–10.3)
Chloride: 106 mmol/L (ref 101–111)
Creatinine, Ser: 0.5 mg/dL (ref 0.44–1.00)
GFR calc Af Amer: >60 mL/min (ref >60)
GFR calc non Af Amer: >60 mL/min (ref >60)
Glucose, Bld: 84 mg/dL (ref 65–99)
Potassium: 3.4 mmol/L — ABNORMAL LOW (ref 3.5–5.1)
Sodium: 138 mmol/L (ref 135–145)
Total Bilirubin: 2.8 mg/dL — ABNORMAL HIGH (ref 0.3–1.2)
Total Protein: 8.8 g/dL — ABNORMAL HIGH (ref 6.5–8.1)

## 2016-05-14 LAB — CBC WITH DIFFERENTIAL/PLATELET
Basophils Absolute: 0 10*3/uL (ref 0.0–0.1)
Basophils Relative: 0 %
Eosinophils Absolute: 0 10*3/uL (ref 0.0–0.7)
Eosinophils Relative: 0 %
HCT: 27.5 % — ABNORMAL LOW (ref 36.0–46.0)
Hemoglobin: 9.4 g/dL — ABNORMAL LOW (ref 12.0–15.0)
Lymphocytes Relative: 26 %
Lymphs Abs: 2 10*3/uL (ref 0.7–4.0)
MCH: 28.8 pg (ref 26.0–34.0)
MCHC: 34.2 g/dL (ref 30.0–36.0)
MCV: 84.4 fL (ref 78.0–100.0)
Monocytes Absolute: 0.9 10*3/uL (ref 0.1–1.0)
Monocytes Relative: 11 %
Neutro Abs: 4.9 10*3/uL (ref 1.7–7.7)
Neutrophils Relative %: 63 %
Platelets: 730 10*3/uL — ABNORMAL HIGH (ref 150–400)
RBC: 3.26 MIL/uL — ABNORMAL LOW (ref 3.87–5.11)
RDW: 15.6 % — ABNORMAL HIGH (ref 11.5–15.5)
WBC: 7.9 10*3/uL (ref 4.0–10.5)

## 2016-05-14 MED ORDER — DIPHENHYDRAMINE HCL 50 MG/ML IJ SOLN
25.0000 mg | INTRAMUSCULAR | Status: AC | PRN
  Administered 2016-05-14 (×2): 25 mg via INTRAVENOUS
  Filled 2016-05-14 (×2): qty 1

## 2016-05-14 MED ORDER — SODIUM CHLORIDE 0.9 % IV SOLN
INTRAVENOUS | Status: DC
  Administered 2016-05-14 – 2016-05-21 (×8): via INTRAVENOUS

## 2016-05-14 MED ORDER — DIPHENHYDRAMINE HCL 25 MG PO CAPS
25.0000 mg | ORAL_CAPSULE | ORAL | Status: DC | PRN
Start: 2016-05-14 — End: 2016-05-22
  Administered 2016-05-20: 25 mg via ORAL
  Filled 2016-05-14: qty 1

## 2016-05-14 MED ORDER — SODIUM CHLORIDE 0.9 % IV SOLN
INTRAVENOUS | Status: DC
  Administered 2016-05-14: 15:00:00 via INTRAVENOUS

## 2016-05-14 MED ORDER — ACETAMINOPHEN 325 MG PO TABS: 650.0000 mg | ORAL_TABLET | Freq: Four times a day (QID) | ORAL | Status: DC | PRN

## 2016-05-14 MED ORDER — ONDANSETRON HCL 4 MG/2ML IJ SOLN
4.0000 mg | Freq: Four times a day (QID) | INTRAMUSCULAR | Status: DC | PRN
  Administered 2016-05-17: 4 mg via INTRAVENOUS
  Filled 2016-05-14: qty 2

## 2016-05-14 MED ORDER — KETOROLAC TROMETHAMINE 15 MG/ML IJ SOLN
15.0000 mg | Freq: Once | INTRAMUSCULAR | Status: AC
  Administered 2016-05-14: 15 mg via INTRAVENOUS
  Filled 2016-05-14: qty 1

## 2016-05-14 MED ORDER — HYDROMORPHONE HCL 2 MG/ML IJ SOLN
2.0000 mg | INTRAMUSCULAR | Status: AC | PRN
  Administered 2016-05-14 (×3): 2 mg via INTRAVENOUS
  Filled 2016-05-14 (×3): qty 1

## 2016-05-14 MED ORDER — PROMETHAZINE HCL 25 MG PO TABS
12.5000 mg | ORAL_TABLET | Freq: Four times a day (QID) | ORAL | Status: DC | PRN
  Administered 2016-05-19: 12.5 mg via ORAL
  Filled 2016-05-14: qty 1

## 2016-05-14 MED ORDER — SODIUM CHLORIDE 0.9 % IV SOLN
25.0000 mg | INTRAVENOUS | Status: DC | PRN
  Filled 2016-05-14: qty 0.5

## 2016-05-14 MED ORDER — MORPHINE SULFATE ER 30 MG PO TBCR
60.0000 mg | EXTENDED_RELEASE_TABLET | Freq: Two times a day (BID) | ORAL | Status: DC
  Administered 2016-05-14 – 2016-05-22 (×16): 60 mg via ORAL
  Filled 2016-05-14 (×17): qty 2

## 2016-05-14 MED ORDER — SODIUM CHLORIDE 0.9% FLUSH: 9.0000 mL | INTRAVENOUS | Status: DC | PRN

## 2016-05-14 MED ORDER — HYDROMORPHONE 1 MG/ML IV SOLN
INTRAVENOUS | Status: DC
  Administered 2016-05-14: 22:00:00 via INTRAVENOUS
  Administered 2016-05-15: 7 mg via INTRAVENOUS
  Administered 2016-05-15: 6.5 mg via INTRAVENOUS
  Administered 2016-05-15: 0.6 mg via INTRAVENOUS
  Administered 2016-05-15: 3.5 mg via INTRAVENOUS
  Administered 2016-05-15: 12:00:00 via INTRAVENOUS
  Administered 2016-05-15: 4.5 mg via INTRAVENOUS
  Administered 2016-05-15: 0.6 mg via INTRAVENOUS
  Administered 2016-05-16: 3 mg via INTRAVENOUS
  Administered 2016-05-16: 2.6 mg via INTRAVENOUS
  Administered 2016-05-16: 8 mg via INTRAVENOUS
  Administered 2016-05-16: 1 mg via INTRAVENOUS
  Administered 2016-05-16: 1.5 mg via INTRAVENOUS
  Filled 2016-05-14 (×2): qty 25

## 2016-05-14 MED ORDER — ENOXAPARIN SODIUM 40 MG/0.4ML ~~LOC~~ SOLN
40.0000 mg | SUBCUTANEOUS | Status: DC
  Administered 2016-05-14 – 2016-05-20 (×6): 40 mg via SUBCUTANEOUS
  Filled 2016-05-14 (×8): qty 0.4

## 2016-05-14 MED ORDER — POTASSIUM CHLORIDE CRYS ER 20 MEQ PO TBCR
40.0000 meq | EXTENDED_RELEASE_TABLET | Freq: Once | ORAL | Status: AC
  Administered 2016-05-14: 40 meq via ORAL
  Filled 2016-05-14: qty 2

## 2016-05-14 MED ORDER — NALOXONE HCL 0.4 MG/ML IJ SOLN: 0.4000 mg | INTRAMUSCULAR | Status: DC | PRN

## 2016-05-14 MED ORDER — ACETAMINOPHEN 650 MG RE SUPP
650.0000 mg | Freq: Four times a day (QID) | RECTAL | Status: DC | PRN
Start: 2016-05-14 — End: 2016-05-21

## 2016-05-14 NOTE — ED Notes (Signed)
Attempt for IV not successful. [place] IV requested. Shawnie Pons RN to assist with IV

## 2016-05-14 NOTE — ED Notes (Signed)
EDP at bedside  

## 2016-05-14 NOTE — ED Notes (Signed)
Shawnie Pons RN at bedside attempting IV

## 2016-05-14 NOTE — ED Notes (Signed)
Pt c/o chest, back and bilateral leg pain since last night. Pt reports this mimics her typical sickle cell pain.

## 2016-05-14 NOTE — ED Notes (Signed)
Pt requests pain meds

## 2016-05-14 NOTE — ED Notes (Signed)
MD at bedside. 

## 2016-05-14 NOTE — H&P (Signed)
TRH H&P   Patient Demographics:    Carmen [name], is a 25 y.o. female  MRN: [id number]   DOB - Jan 25, 1991  Admit Date - 05/14/2016  Outpatient Primary MD for the patient is [name], MD   Referring M.D.: Dr. Wilson Singer  Outpatient Specialists: none  Patient coming from: Home  Chief Complaint  Patient presents with  . Sickle Cell Pain Crisis      HPI:    Carmen [name]  is a 25 y.o. female,With sickle cell anemia with recurrent hospitalization for sickle cell pain crisis, last admitted 3 weeks back presented with acute onset of pain in her legs back and chest since yesterday evening. Patient takes morphine 60 mg twice a day and oxycodone 1 tablet every 4 hours which did not improve her pain. She increased the dose of her oxycodone as was instructed by her PCP but it did not relieve her pain. She denies any shortness of breath or fever, chills, nausea, vomiting, abdominal pain, dysuria, diarrhea. Denies any sick contact. Reports her pain to be typical of her sickle cell crisis and 10/10 in severity. In the ED, vitals were stable. Blood work showed hemoglobin of 9.4 (stable), platelets of 730, potassium of 3.4, normal renal function, bilirubin of 2.8 with normal LFTs. Chest x-ray was unremarkable. she was given 2 mg IV Dilaudid, 15mg  IV toradol without any change in her pain symptoms and hospitalist admission requested.     Review of systems:     No Fever-chills, No Headache, No changes with Vision or hearing, No problems swallowing food or Liquids,  Anterior chest wall pain+, no Cough or Shortness of Breath, No Abdominal pain, No Nausea or Vommitting, Bowel movements are regular, No Blood in stool or Urine, No dysuria, No new skin rashes or bruises, Pain in back and legs No new joints pains-aches,  No new weakness, tingling, numbness in any extremity, No recent weight  gain or loss, No polyuria, polydypsia or polyphagia, No significant Mental Stressors.     With Past History of the following :    Past Medical History  Diagnosis Date  . Sickle cell crisis ([place])   . Headache(784.0)   . Abortion     05/2012      Past Surgical History  Procedure Laterality Date  . Splenectomy    . Cholecystectomy N/A 11/30/2014    Procedure: LAPAROSCOPIC CHOLECYSTECTOMY SINGLE SITE WITH INTRAOPERATIVE CHOLANGIOGRAM;  Surgeon: Michael Boston, MD;  Location: [facility] ORS;  Service: General;  Laterality: N/A;      Social History:     Social History  Substance Use Topics  . Smoking status: Never Smoker   . Smokeless tobacco: Never Used  . Alcohol Use: No     Lives -      Family History :     Family History  Problem Relation Age of Onset  . Hypertension  Mother   . Sickle cell anemia Sister   . Kidney disease Sister     Lupus  . Arthritis Sister   . Sickle cell anemia Sister   . Sickle cell trait Sister   . Heart disease Maternal Aunt   . Heart disease Maternal Uncle       Home Medications:   Prior to Admission medications   Medication Sig Start Date End Date Taking? Authorizing Provider  morphine (MS CONTIN) 60 MG 12 hr tablet Take 60 mg by mouth every 12 (twelve) hours.   Yes Historical Provider, MD  oxyCODONE-acetaminophen (PERCOCET) 10-325 MG tablet Take 1 tablet by mouth every 6 (six) hours as needed for pain. For pain 01/27/16  Yes Historical Provider, MD  cyclobenzaprine (FLEXERIL) 10 MG tablet Take 1 tablet (10 mg total) by mouth 2 (two) times daily as needed for muscle spasms. Patient not taking: Reported on 05/14/2016 03/03/16   Delsa Grana, PA-C  HYDROcodone-acetaminophen (NORCO/VICODIN) 5-325 MG tablet Take 2 tablets by mouth every 4 (four) hours as needed. Patient not taking: Reported on 05/14/2016 03/03/16   Delsa Grana, PA-C  ibuprofen (ADVIL,MOTRIN) 800 MG tablet Take 1 tablet (800 mg total) by mouth 3 (three) times daily. Patient not  taking: Reported on 05/14/2016 03/03/16   Delsa Grana, PA-C     Allergies:    No Known Allergies   Physical Exam:   Vitals  Blood pressure 111/75, pulse 78, temperature 98.9 F (37.2 C), temperature source Oral, resp. rate 19, height 5\' 3"  (1.6 m), weight 77.111 kg (170 lb), last menstrual period 04/29/2016, SpO2 96 %.   Gen.: Young female lying in bed in distress with pain HEENT: Pallor present, anicteric, moist mucosa, supple neck Chest: Clear bilaterally CVS: Normal S1 and S2, no murmurs rub or gallop GI: Soft, nondistended, nontender, bowel sounds present Musculoskeletal: Warm, no edema, tender to pressure over her legs CNS: Alert and oriented   Data Review:    CBC  Recent Labs Lab 05/14/16 1335  WBC 7.9  HGB 9.4*  HCT 27.5*  PLT 730*  MCV 84.4  MCH 28.8  MCHC 34.2  RDW 15.6*  LYMPHSABS 2.0  MONOABS 0.9  EOSABS 0.0  BASOSABS 0.0   ------------------------------------------------------------------------------------------------------------------  Chemistries   Recent Labs Lab 05/14/16 1335  NA 138  K 3.4*  CL 106  CO2 22  GLUCOSE 84  BUN 8  CREATININE 0.50  CALCIUM 9.8  AST 25  ALT 19  ALKPHOS 53  BILITOT 2.8*   ------------------------------------------------------------------------------------------------------------------ estimated creatinine clearance is 107.6 mL/min (by C-G formula based on Cr of 0.5). ------------------------------------------------------------------------------------------------------------------ No results for input(s): TSH, T4TOTAL, T3FREE, THYROIDAB in the last 72 hours.  Invalid input(s): FREET3  Coagulation profile No results for input(s): INR, PROTIME in the last 168 hours. ------------------------------------------------------------------------------------------------------------------- No results for input(s): DDIMER in the last 72  hours. -------------------------------------------------------------------------------------------------------------------  Cardiac Enzymes No results for input(s): CKMB, TROPONINI, MYOGLOBIN in the last 168 hours.  Invalid input(s): CK ------------------------------------------------------------------------------------------------------------------ No results found for: BNP   ---------------------------------------------------------------------------------------------------------------  Urinalysis    Component Value Date/Time   COLORURINE YELLOW 02/10/2016 1742   APPEARANCEUR CLOUDY* 02/10/2016 1742   LABSPEC 1.014 02/10/2016 1742   PHURINE 6.0 02/10/2016 1742   GLUCOSEU NEGATIVE 02/10/2016 1742   HGBUR NEGATIVE 02/10/2016 1742   BILIRUBINUR NEGATIVE 02/10/2016 1742   KETONESUR 40* 02/10/2016 1742   PROTEINUR NEGATIVE 02/10/2016 1742   UROBILINOGEN 1.0 09/12/2015 1548   NITRITE NEGATIVE 02/10/2016 1742   LEUKOCYTESUR LARGE* 02/10/2016 1742    ----------------------------------------------------------------------------------------------------------------  Imaging Results:    Dg Chest 2 View  05/14/2016  CLINICAL DATA:  Chest pain, bilateral leg pain, sickle cell pain EXAM: CHEST  2 VIEW COMPARISON:  02/10/2016 FINDINGS: Cardiomediastinal silhouette is stable. No acute infiltrate or pleural effusion. No pulmonary edema. Bony thorax is unremarkable. IMPRESSION: No active cardiopulmonary disease. Electronically Signed   By: Lahoma Crocker M.D.   On: 05/14/2016 13:57    My personal review of [id number] tachycardia at 101, no ST-T changes    Assessment/plan  Sickle cell pain crisis ([place]) Admit to telemetry. Pain not improved with IV pain medications receiving the ED. Check LDH. Started on Dilaudid PCA. Supportive care with oxygen and gentle IV hydration. Added antiemetics as needed. Sickle cell team to resume care in the morning.  Active Problems:   Thrombocytosis  ([facility]) Likely reactive.    Hypokalemia Replenish. Check magnesium.     DVT Prophylaxis Subcutaneous Lovenox  AM Labs Ordered, also please review Full Orders  Family Communication: None at bedside   Code Status full code  Likely DC to  Home  Condition: Fair  Consults called: None   Admission status: Inpatient  Time spent in minutes : 50   Louellen Molder M.D on 05/14/2016 at 6:11 PM  Between 7am to 7pm - Pager - [phone]. After 7pm go to [email or web address] - password [facility]  [facility] - Office  [phone]

## 2016-05-14 NOTE — ED Provider Notes (Signed)
CSN: [id number]     Arrival date & time 05/14/16  1247 History   First MD Initiated Contact with Patient 05/14/16 1403     Chief Complaint  Patient presents with  . Sickle Cell Pain Crisis     (Consider location/radiation/quality/duration/timing/severity/associated sxs/prior Treatment) HPI   25 year old female with bilateral lower extremity and lower back pain. She has a past history of sickle cell anemia. She states that current symptoms feel similar to prior pain crises. Worsening since yesterday. She's been taking her home medications which include morphine and oxycodone with only very mild relief. No respiratory complaints. Denies any chest pain. No fever. No cough. No joint swelling or rash.  Past Medical History  Diagnosis Date  . Sickle cell crisis ([place])   . Headache(784.0)   . Abortion     05/2012   Past Surgical History  Procedure Laterality Date  . Splenectomy    . Cholecystectomy N/A 11/30/2014    Procedure: LAPAROSCOPIC CHOLECYSTECTOMY SINGLE SITE WITH INTRAOPERATIVE CHOLANGIOGRAM;  Surgeon: Michael Boston, MD;  Location: [facility];  Service: General;  Laterality: N/A;   Family History  Problem Relation Age of Onset  . Hypertension Mother   . Sickle cell anemia Sister   . Kidney disease Sister     Lupus  . Arthritis Sister   . Sickle cell anemia Sister   . Sickle cell trait Sister   . Heart disease Maternal Aunt   . Heart disease Maternal Uncle    Social History  Substance Use Topics  . Smoking status: Never Smoker   . Smokeless tobacco: Never Used  . Alcohol Use: No   OB History    Gravida Para Term Preterm AB TAB SAB Ectopic Multiple Living   1    1          Review of Systems  All systems reviewed and negative, other than as noted in HPI.   Allergies  Review of patient's allergies indicates no known allergies.  Home Medications   Prior to Admission medications   Medication Sig Start Date End Date Taking? Authorizing Provider  morphine (MS CONTIN)  60 MG 12 hr tablet Take 60 mg by mouth every 12 (twelve) hours.   Yes Historical Provider, MD  oxyCODONE-acetaminophen (PERCOCET) 10-325 MG tablet Take 1 tablet by mouth every 6 (six) hours as needed for pain. For pain 01/27/16  Yes Historical Provider, MD  cyclobenzaprine (FLEXERIL) 10 MG tablet Take 1 tablet (10 mg total) by mouth 2 (two) times daily as needed for muscle spasms. Patient not taking: Reported on 05/14/2016 03/03/16   Delsa Grana, PA-C  HYDROcodone-acetaminophen (NORCO/VICODIN) 5-325 MG tablet Take 2 tablets by mouth every 4 (four) hours as needed. Patient not taking: Reported on 05/14/2016 03/03/16   Delsa Grana, PA-C  ibuprofen (ADVIL,MOTRIN) 800 MG tablet Take 1 tablet (800 mg total) by mouth 3 (three) times daily. Patient not taking: Reported on 05/14/2016 03/03/16   Delsa Grana, PA-C   BP 122/83 mmHg  Pulse 90  Temp(Src) 98.9 F (37.2 C) (Oral)  Resp 21  Ht 5\' 3"  (1.6 m)  Wt 170 lb (77.111 kg)  BMI 30.12 kg/m2  SpO2 96%  LMP 04/29/2016 Physical Exam  Constitutional: She is oriented to person, place, and time. She appears well-developed and well-nourished. No distress.  HENT:  Head: Normocephalic and atraumatic.  Eyes: Conjunctivae are normal. Right eye exhibits no discharge. Left eye exhibits no discharge.  Neck: Neck supple.  Cardiovascular: Normal rate, regular rhythm and normal heart sounds.  Exam reveals no gallop and no friction rub.   No murmur heard. Pulmonary/Chest: Effort normal and breath sounds normal. No respiratory distress.  Abdominal: Soft. She exhibits no distension. There is no tenderness.  Musculoskeletal: She exhibits no edema or tenderness.  Lower extremities symmetric as compared to each other. No calf tenderness. Negative Homan's. No palpable cords.   Neurological: She is alert and oriented to person, place, and time. No cranial nerve deficit. She exhibits normal muscle tone. Coordination normal.  Skin: Skin is warm and dry.  Psychiatric: She has a  normal mood and affect. Her behavior is normal. Thought content normal.  Nursing note and vitals reviewed.   ED Course  Procedures (including critical care time) Labs Review Labs Reviewed  COMPREHENSIVE METABOLIC PANEL - Abnormal; Notable for the following:    Potassium 3.4 (*)    Total Protein 8.8 (*)    Total Bilirubin 2.8 (*)    All other components within normal limits  CBC WITH DIFFERENTIAL/PLATELET - Abnormal; Notable for the following:    RBC 3.26 (*)    Hemoglobin 9.4 (*)    HCT 27.5 (*)    RDW 15.6 (*)    Platelets 730 (*)    All other components within normal limits    Imaging Review Dg Chest 2 View  05/14/2016  CLINICAL DATA:  Chest pain, bilateral leg pain, sickle cell pain EXAM: CHEST  2 VIEW COMPARISON:  02/10/2016 FINDINGS: Cardiomediastinal silhouette is stable. No acute infiltrate or pleural effusion. No pulmonary edema. Bony thorax is unremarkable. IMPRESSION: No active cardiopulmonary disease. Electronically Signed   By: Lahoma Crocker M.D.   On: 05/14/2016 13:57   I have personally reviewed and evaluated these images and lab results as part of my medical decision-making.   EKG Interpretation   Date/Time:  Friday May 14 2016 12:59:13 EDT Ventricular Rate:  101 PR Interval:    QRS Duration: 84 QT Interval:  353 QTC Calculation: 458 R Axis:   53 Text Interpretation:  Sinus tachycardia RSR' in V1 or V2, probably normal  variant Baseline wander in lead(s) II III aVF Confirmed by Wilson Singer  MD,  Maurisa Tesmer ([id number]) on 05/14/2016 2:04:28 PM      MDM   Final diagnoses:  Sickle cell pain crisis ([place])    25 year old female with primarily lower extremity and lower back pain consistent with prior pain crises. She is afebrile. Nontoxic. Labs appear to be close to baseline. At this time, I have a very low suspicion for emergent complication of sickle cell anemia or otherwise. Will treat symptoms and reassess.  Several doses of pain meds. Reports symptoms not changed. Labs  at baseline. No new complaints. WIll admit for pain control.     Virgel Manifold, MD 05/14/16 [phone]

## 2016-05-15 DIAGNOSIS — D5700 Hb-SS disease with crisis, unspecified: Principal | ICD-10-CM

## 2016-05-15 LAB — CBC
HEMATOCRIT: 21.4 % — AB (ref 36.0–46.0)
Hemoglobin: 7.5 g/dL — ABNORMAL LOW (ref 12.0–15.0)
MCH: 29.1 pg (ref 26.0–34.0)
MCHC: 35 g/dL (ref 30.0–36.0)
MCV: 82.9 fL (ref 78.0–100.0)
Platelets: 580 10*3/uL — ABNORMAL HIGH (ref 150–400)
RBC: 2.58 MIL/uL — ABNORMAL LOW (ref 3.87–5.11)
RDW: 15.4 % (ref 11.5–15.5)
WBC: 11.7 10*3/uL — ABNORMAL HIGH (ref 4.0–10.5)

## 2016-05-15 MED ORDER — KETOROLAC TROMETHAMINE 15 MG/ML IJ SOLN
30.0000 mg | Freq: Four times a day (QID) | INTRAMUSCULAR | Status: AC
  Administered 2016-05-15 – 2016-05-20 (×18): 30 mg via INTRAVENOUS
  Filled 2016-05-15 (×18): qty 2

## 2016-05-15 MED ORDER — HYDROMORPHONE HCL 2 MG/ML IJ SOLN
2.0000 mg | INTRAMUSCULAR | Status: AC | PRN
  Administered 2016-05-15 (×3): 2 mg via INTRAVENOUS
  Filled 2016-05-15 (×3): qty 1

## 2016-05-15 NOTE — Progress Notes (Signed)
Subjective: A 25 year old female well known to our service presenting with sickle cell crisis. Patient was apparently out in the sun the temperature over 100 and became dehydrated. She is having pain now in her chest, low back and legs. She denied any nausea vomiting or diarrhea. She is currently on IV fluids with Dilaudid PCA and Toradol. Patient's pain is a 9 out of 10. No fever or chills.  Objective: Vital signs in last 24 hours: Temp:  [98.9 F (37.2 C)-99.3 F (37.4 C)] 99.1 F (37.3 C) (07/14 2120) Pulse Rate:  [69-110] 69 (07/14 2120) Resp:  [13-22] 14 (07/15 0350) BP: (109-126)/(64-95) 111/75 mmHg (07/14 2120) SpO2:  [94 %-100 %] 94 % (07/15 0350) Weight:  [74.98 kg (165 lb 4.8 oz)-77.111 kg (170 lb)] 74.98 kg (165 lb 4.8 oz) (07/14 2120) Weight change:     Intake/Output from previous day: 07/14 0701 - 07/15 0700 In: 1500 [P.O.:500; I.V.:1000] Out: -  Intake/Output this shift:    General appearance: alert, cooperative and no distress Eyes: conjunctivae/corneas clear. PERRL, EOM's intact. Fundi benign. Back: symmetric, no curvature. ROM normal. No CVA tenderness. Resp: clear to auscultation bilaterally Chest wall: no tenderness Cardio: regular rate and rhythm, S1, S2 normal, no murmur, click, rub or gallop GI: soft, non-tender; bowel sounds normal; no masses,  no organomegaly Extremities: extremities normal, atraumatic, no cyanosis or edema Pulses: 2+ and symmetric Skin: Skin color, texture, turgor normal. No rashes or lesions Neurologic: Grossly normal  Lab Results:  Recent Labs  05/14/16 1335 05/15/16 0533  WBC 7.9 11.7*  HGB 9.4* 7.5*  HCT 27.5* 21.4*  PLT 730* 580*   BMET  Recent Labs  05/14/16 1335  NA 138  K 3.4*  CL 106  CO2 22  GLUCOSE 84  BUN 8  CREATININE 0.50  CALCIUM 9.8    Studies/Results: Dg Chest 2 View  05/14/2016  CLINICAL DATA:  Chest pain, bilateral leg pain, sickle cell pain EXAM: CHEST  2 VIEW COMPARISON:  02/10/2016  FINDINGS: Cardiomediastinal silhouette is stable. No acute infiltrate or pleural effusion. No pulmonary edema. Bony thorax is unremarkable. IMPRESSION: No active cardiopulmonary disease. Electronically Signed   By: Lahoma Crocker M.D.   On: 05/14/2016 13:57    Medications: I have reviewed the patient's current medications.  Assessment/Plan: A 25 year old female admitted with sickle cell painful crisis. #1 sickle cell painful crisis: Patient will be maintained on Dilaudid PCA, Toradol and  physician assistant dosing at 2 mg every 4 hours. Continue long-acting morphine. Patient is now opiate Intolerant. I will reassess her pain tomorrow and adjust as necessary #2 chronic anemia: Due to sickle cell disease. Hemoglobin dropped from 9.4-7.5 probably due to hemodilution. Continue to keep an eye. #3 hypokalemia: Replete potassium and monitor. Probably due to dehydration. #4 dehydration: Aggressively hydrate patient. Continue monitoring. 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  LOS: 1 day   Ianmichael Amescua,LAWAL 05/15/2016, 6:27 AM

## 2016-05-16 MED ORDER — HYDROMORPHONE HCL 1 MG/ML IJ SOLN
1.0000 mg | INTRAMUSCULAR | Status: DC | PRN
  Administered 2016-05-16 – 2016-05-19 (×11): 1 mg via INTRAVENOUS
  Filled 2016-05-16 (×11): qty 1

## 2016-05-16 MED ORDER — HYDROMORPHONE 1 MG/ML IV SOLN
INTRAVENOUS | Status: DC
  Administered 2016-05-16: 5 mg via INTRAVENOUS
  Administered 2016-05-16: 1.5 mg via INTRAVENOUS
  Administered 2016-05-16: 23:00:00 via INTRAVENOUS
  Administered 2016-05-17: 4 mg via INTRAVENOUS
  Administered 2016-05-17: 1.5 mg via INTRAVENOUS
  Administered 2016-05-17: 4.5 mg via INTRAVENOUS
  Administered 2016-05-17: 5.5 mg via INTRAVENOUS
  Administered 2016-05-17: 1.2 mg via INTRAVENOUS
  Administered 2016-05-18: 2 mg via INTRAVENOUS
  Administered 2016-05-18: 1.5 mg via INTRAVENOUS
  Administered 2016-05-18: 2.5 mg via INTRAVENOUS
  Administered 2016-05-18: 6 mg via INTRAVENOUS
  Administered 2016-05-19: 10:00:00 via INTRAVENOUS
  Administered 2016-05-19: 7 mg via INTRAVENOUS
  Administered 2016-05-19: 1 mg via INTRAVENOUS
  Administered 2016-05-19: 7 mg via INTRAVENOUS
  Administered 2016-05-19: 2 mg via INTRAVENOUS
  Administered 2016-05-19: 10.5 mg via INTRAVENOUS
  Administered 2016-05-20: 9 mg via INTRAVENOUS
  Administered 2016-05-20: 23:00:00 via INTRAVENOUS
  Administered 2016-05-20: 4.5 mg via INTRAVENOUS
  Administered 2016-05-20: 08:00:00 via INTRAVENOUS
  Administered 2016-05-20: 7.5 mg via INTRAVENOUS
  Administered 2016-05-20: 4 mg via INTRAVENOUS
  Administered 2016-05-20: 9 mg via INTRAVENOUS
  Administered 2016-05-20: 5 mg via INTRAVENOUS
  Administered 2016-05-21: 6.5 mg via INTRAVENOUS
  Administered 2016-05-21: 11.5 mg via INTRAVENOUS
  Administered 2016-05-21: 4.5 mg via INTRAVENOUS
  Administered 2016-05-21: 6 mg via INTRAVENOUS
  Administered 2016-05-21: 15:00:00 via INTRAVENOUS
  Administered 2016-05-21: 3 mg via INTRAVENOUS
  Administered 2016-05-22: 7 mg via INTRAVENOUS
  Administered 2016-05-22: 6 mg via INTRAVENOUS
  Administered 2016-05-22: 7 mg via INTRAVENOUS
  Administered 2016-05-22: 10 mg via INTRAVENOUS
  Administered 2016-05-22: 11 mg via INTRAVENOUS
  Filled 2016-05-16 (×7): qty 25

## 2016-05-16 NOTE — Progress Notes (Signed)
Subjective: Patient still having pain at 10 out of 10. Her physician assisted dosing was apparently stopped last night. She is on the Dilaudid PCA but only used 13 mg overnight. No fever or chills no nausea vomiting or diarrhea. Pain is localized to her back with no radiation. It is worsened with any movement at this point.  Objective: Vital signs in last 24 hours: Temp:  [97.7 F (36.5 C)-98.4 F (36.9 C)] 98.4 F (36.9 C) (07/16 1313) Pulse Rate:  [65-85] 81 (07/16 1313) Resp:  [11-20] 13 (07/16 1800) BP: (105-122)/(56-76) 122/67 mmHg (07/16 1313) SpO2:  [97 %-100 %] 100 % (07/16 1800) FiO2 (%):  [12 %] 12 % (07/15 2000) Weight change:  Last BM Date: 05/14/16  Intake/Output from previous day: 07/15 0701 - 07/16 0700 In: 2076.3 [P.O.:240; I.V.:1836.3] Out: -  Intake/Output this shift: Total I/O In: 720 [P.O.:720] Out: -   General appearance: alert, cooperative and no distress Eyes: conjunctivae/corneas clear. PERRL, EOM's intact. Fundi benign. Back: symmetric, no curvature. ROM normal. No CVA tenderness. Resp: clear to auscultation bilaterally Chest wall: no tenderness Cardio: regular rate and rhythm, S1, S2 normal, no murmur, click, rub or gallop GI: soft, non-tender; bowel sounds normal; no masses,  no organomegaly Extremities: extremities normal, atraumatic, no cyanosis or edema Pulses: 2+ and symmetric Skin: Skin color, texture, turgor normal. No rashes or lesions Neurologic: Grossly normal  Lab Results:  Recent Labs  05/14/16 1335 05/15/16 0533  WBC 7.9 11.7*  HGB 9.4* 7.5*  HCT 27.5* 21.4*  PLT 730* 580*   BMET  Recent Labs  05/14/16 1335  NA 138  K 3.4*  CL 106  CO2 22  GLUCOSE 84  BUN 8  CREATININE 0.50  CALCIUM 9.8    Studies/Results: No results found.  Medications: I have reviewed the patient's current medications.  Assessment/Plan: A 25 year old female admitted with sickle cell painful crisis.  #1 sickle cell painful crisis: Patient  will be maintained on Dilaudid PCA, Toradol and  physician assistant dosing at 2 mg every 3 hours. Continue long-acting morphine. I have restarted the physician assisted dosing and reduce her interval to every 8 minutes lockout.  #2 chronic anemia: Due to sickle cell disease. We'll recheck hemoglobin in the morning. No evidence of acute hemolysis.  #3 hypokalemia: Replete potassium and monitor. Probably due to dehydration.  #4 dehydration: Aggressively hydrate patient. Continue monitoring.  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   LOS: 2 days   Cathrine Krizan,LAWAL 05/16/2016, 6:19 PM

## 2016-05-17 DIAGNOSIS — K5900 Constipation, unspecified: Secondary | ICD-10-CM

## 2016-05-17 LAB — COMPREHENSIVE METABOLIC PANEL
ALT: 14 U/L (ref 14–54)
ANION GAP: 6 (ref 5–15)
AST: 22 U/L (ref 15–41)
Albumin: 3.7 g/dL (ref 3.5–5.0)
Alkaline Phosphatase: 52 U/L (ref 38–126)
BILIRUBIN TOTAL: 1.7 mg/dL — AB (ref 0.3–1.2)
BUN: 7 mg/dL (ref 6–20)
CO2: 28 mmol/L (ref 22–32)
Calcium: 8.9 mg/dL (ref 8.9–10.3)
Chloride: 104 mmol/L (ref 101–111)
Creatinine, Ser: 0.41 mg/dL — ABNORMAL LOW (ref 0.44–1.00)
Glucose, Bld: 97 mg/dL (ref 65–99)
POTASSIUM: 3.8 mmol/L (ref 3.5–5.1)
Sodium: 138 mmol/L (ref 135–145)
TOTAL PROTEIN: 6.9 g/dL (ref 6.5–8.1)

## 2016-05-17 LAB — CBC WITH DIFFERENTIAL/PLATELET
BASOS PCT: 0 %
Basophils Absolute: 0 10*3/uL (ref 0.0–0.1)
EOS ABS: 0.3 10*3/uL (ref 0.0–0.7)
Eosinophils Relative: 2 %
HEMATOCRIT: 23.3 % — AB (ref 36.0–46.0)
Hemoglobin: 8.4 g/dL — ABNORMAL LOW (ref 12.0–15.0)
LYMPHS ABS: 6.4 10*3/uL — AB (ref 0.7–4.0)
LYMPHS PCT: 52 %
MCH: 29 pg (ref 26.0–34.0)
MCHC: 36.1 g/dL — AB (ref 30.0–36.0)
MCV: 80.3 fL (ref 78.0–100.0)
MONO ABS: 1.4 10*3/uL — AB (ref 0.1–1.0)
Monocytes Relative: 11 %
NRBC: 2 /100{WBCs} — AB
Neutro Abs: 4.4 10*3/uL (ref 1.7–7.7)
Neutrophils Relative %: 35 %
PLATELETS: 569 10*3/uL — AB (ref 150–400)
RBC: 2.9 MIL/uL — ABNORMAL LOW (ref 3.87–5.11)
RDW: 16.6 % — AB (ref 11.5–15.5)
WBC: 12.5 10*3/uL — ABNORMAL HIGH (ref 4.0–10.5)

## 2016-05-17 MED ORDER — MAGNESIUM CITRATE PO SOLN
1.0000 | Freq: Once | ORAL | Status: AC
  Administered 2016-05-17: 1 via ORAL

## 2016-05-17 NOTE — Progress Notes (Signed)
[place] PROGRESS NOTE  Carmen [name] [id number] DOB: 1991-07-14 DOA: 05/14/2016 PCP: [name], MD  Assessment/Plan: Principal Problem:   Sickle cell pain crisis [facility]) Active Problems:   Thrombocytosis (H[facility])   Hypokalemia   Sickle cell crisis ([place])   1. Hb SS with crisis: Pt reports that pain minimally decreased and localized to back and chest wall. Will continue PCA, Clinician Assisted doses and Toradol. Decrease IVF as patient eating and drinking well.  2. Anemia of Chronic Disease: Hb stable. 3. Leukocytosis: Was likely related to crisis. Resolved Anemia of Chronic Disease: Hemoglobin remains at baseline.   4. Constipation: No BM since admission. Will give Magnesium Citrate.   Code Status: Full Code Family Communication: N/A Disposition Plan:Anticipate discharge in next 24-48 hours.  MATTHEWS,MICHELLE A.  Pager [phone]. If 7PM-7AM, please contact night-coverage.  05/17/2016, 4:18 PM  LOS: 3 days   Interim History: Pt is still reporting her pain as an intensity of  8/10 and still localized to chest wall and back. She has used 25 mg with 51/50: Demands deliveries in addition to 5 mg of Dilaudid by IV push the last 24 hours. Reports that she's not had a BM since admission.  Consultants:  None  Procedures:  None  Antibiotics:  None   Objective: Filed Vitals:   05/17/16 0422 05/17/16 0752 05/17/16 1200 05/17/16 1425  BP:    106/60  Pulse:    60  Temp:    97.9 F (36.6 C)  TempSrc:    Oral  Resp: 12 12 15 12   Height:      Weight:      SpO2:  100% 100% 100%   Weight change:   Intake/Output Summary (Last 24 hours) at 05/17/16 1618 Last data filed at 05/17/16 0800  Gross per 24 hour  Intake    360 ml  Output      0 ml  Net    360 ml    General: Alert, awake, oriented x3, in minimal distress due to pain.  HEENT: [facility]/AT PEERL, EOMI, anicteric. Neck: Trachea midline,  no masses, no thyromegal,y no JVD, no carotid bruit OROPHARYNX:   Moist, No exudate/ erythema/lesions.  Heart: Regular rate and rhythm, without murmurs, rubs, gallops, PMI non-displaced, no heaves or thrills on palpation.  Lungs: Clear to auscultation, no wheezing or rhonchi noted. No increased vocal fremitus resonant to percussion  Abdomen: Soft, nontender, nondistended, positive bowel sounds, no masses no hepatosplenomegaly noted.  Neuro: No focal neurological deficits noted cranial nerves II through XII grossly intact.  Strength at functional baseline in bilateral upper and lower extremities. Musculoskeletal: No warmth swelling or erythema around joints, no spinal tenderness noted. Psychiatric: Patient alert and oriented x3, good insight and cognition, good recent to remote recall.    Data Reviewed: Basic Metabolic Panel:  Recent Labs Lab 05/14/16 1335 05/17/16 0539  NA 138 138  K 3.4* 3.8  CL 106 104  CO2 22 28  GLUCOSE 84 97  BUN 8 7  CREATININE 0.50 0.41*  CALCIUM 9.8 8.9   Liver Function Tests:  Recent Labs Lab 05/14/16 1335 05/17/16 0539  AST 25 22  ALT 19 14  ALKPHOS 53 52  BILITOT 2.8* 1.7*  PROT 8.8* 6.9  ALBUMIN 4.8 3.7   No results for input(s): LIPASE, AMYLASE in the last 168 hours. No results for input(s): AMMONIA in the last 168 hours. CBC:  Recent Labs Lab 05/14/16 1335 05/15/16 0533 05/17/16 0539  WBC 7.9 11.7* 12.5*  NEUTROABS 4.9  --  4.4  HGB 9.4* 7.5* 8.4*  HCT 27.5* 21.4* 23.3*  MCV 84.4 82.9 80.3  PLT 730* 580* 569*   Cardiac Enzymes: No results for input(s): CKTOTAL, CKMB, CKMBINDEX, TROPONINI in the last 168 hours. BNP (last 3 results) No results for input(s): BNP in the last 8760 hours.  ProBNP (last 3 results) No results for input(s): PROBNP in the last 8760 hours.  CBG: No results for input(s): GLUCAP in the last 168 hours.  No results found for this or any previous visit (from the past 240 hour(s)).   Studies: Dg Chest 2 View  05/14/2016  CLINICAL DATA:  Chest pain, bilateral leg  pain, sickle cell pain EXAM: CHEST  2 VIEW COMPARISON:  02/10/2016 FINDINGS: Cardiomediastinal silhouette is stable. No acute infiltrate or pleural effusion. No pulmonary edema. Bony thorax is unremarkable. IMPRESSION: No active cardiopulmonary disease. Electronically Signed   By: Lahoma Crocker M.D.   On: 05/14/2016 13:57    Scheduled Meds: . enoxaparin (LOVENOX) injection  40 mg Subcutaneous Q24H  . HYDROmorphone   Intravenous Q4H  . ketorolac  30 mg Intravenous Q6H  . morphine  60 mg Oral Q12H   Continuous Infusions: . sodium chloride 125 mL/hr at 05/14/16 1453  . sodium chloride 75 mL/hr at 05/17/16 0803    Time spent 25 miunutes

## 2016-05-18 MED ORDER — SENNOSIDES-DOCUSATE SODIUM 8.6-50 MG PO TABS
2.0000 | ORAL_TABLET | Freq: Every evening | ORAL | Status: DC | PRN
  Administered 2016-05-19: 2 via ORAL
  Filled 2016-05-18: qty 2

## 2016-05-18 MED ORDER — POLYETHYLENE GLYCOL 3350 17 G PO PACK
17.0000 g | PACK | Freq: Every day | ORAL | Status: DC | PRN
  Administered 2016-05-19: 17 g via ORAL
  Filled 2016-05-18: qty 1

## 2016-05-18 MED ORDER — FLEET ENEMA 7-19 GM/118ML RE ENEM: 1.0000 | ENEMA | Freq: Once | RECTAL | Status: DC | PRN

## 2016-05-18 NOTE — Progress Notes (Signed)
[place] PROGRESS NOTE  Carmen [name] [id number] DOB: Jul 09, 1991 DOA: 05/14/2016 PCP: [name], MD  Assessment/Plan: Principal Problem:   Sickle cell pain crisis [facility]) Active Problems:   Thrombocytosis (HCC)   Hypokalemia   Sickle cell crisis ([place])   1. Hb SS with crisis: Pt reports that pain minimally decreased and localized to back and chest wall. She has used only 17.5 mg on the PCA in the last 24 hours, Will continue PCA, Clinician Assisted doses and Toradol. Decrease IVF as patient eating and drinking well.  2. Anemia of Chronic Disease: Hb stable. 3. Leukocytosis: Was likely related to crisis. Resolved Anemia of Chronic Disease: Hemoglobin remains at baseline.   4. Constipation: No BM since admission. Will give Lactulose..   Code Status: Full Code Family Communication: N/A Disposition Plan:Anticipate discharge in next 24-48 hours.  MATTHEWS,MICHELLE A.  Pager [phone]. If 7PM-7AM, please contact night-coverage.  05/18/2016, 2:12 PM  LOS: 4 days   Interim History: Pt is still reporting her pain as an intensity of  9/10 and still localized to chest wall and back. She has used 17.5 mg with 37/35: Demands deliveries in addition to 5 mg of Dilaudid by IV push the last 24 hours. Reports that she's not had a BM since admission.  Consultants:  None  Procedures:  None  Antibiotics:  None   Objective: Filed Vitals:   05/18/16 0352 05/18/16 0543 05/18/16 1000 05/18/16 1239  BP:  104/63 111/62   Pulse:  68 87   Temp:  97.9 F (36.6 C) 98 F (36.7 C)   TempSrc:  Oral Oral   Resp: 10 11 14 12   Height:      Weight:      SpO2: 95% 100% 100% 100%   Weight change:   Intake/Output Summary (Last 24 hours) at 05/18/16 1412 Last data filed at 05/17/16 1531  Gross per 24 hour  Intake    240 ml  Output      0 ml  Net    240 ml    General: Alert, awake, oriented x3, in minimal distress due to pain.  HEENT: /AT PEERL, EOMI, anicteric. Neck:  Trachea midline,  no masses, no thyromegal,y no JVD, no carotid bruit OROPHARYNX:  Moist, No exudate/ erythema/lesions.  Heart: Regular rate and rhythm, without murmurs, rubs, gallops, PMI non-displaced, no heaves or thrills on palpation.  Lungs: Clear to auscultation, no wheezing or rhonchi noted. No increased vocal fremitus resonant to percussion  Abdomen: Soft, nontender, nondistended, positive bowel sounds, no masses no hepatosplenomegaly noted.  Neuro: No focal neurological deficits noted cranial nerves II through XII grossly intact.  Strength at functional baseline in bilateral upper and lower extremities. Musculoskeletal: No warmth swelling or erythema around joints, no spinal tenderness noted. Psychiatric: Patient alert and oriented x3, good insight and cognition, good recent to remote recall.    Data Reviewed: Basic Metabolic Panel:  Recent Labs Lab 05/14/16 1335 05/17/16 0539  NA 138 138  K 3.4* 3.8  CL 106 104  CO2 22 28  GLUCOSE 84 97  BUN 8 7  CREATININE 0.50 0.41*  CALCIUM 9.8 8.9   Liver Function Tests:  Recent Labs Lab 05/14/16 1335 05/17/16 0539  AST 25 22  ALT 19 14  ALKPHOS 53 52  BILITOT 2.8* 1.7*  PROT 8.8* 6.9  ALBUMIN 4.8 3.7   No results for input(s): LIPASE, AMYLASE in the last 168 hours. No results for input(s): AMMONIA in the last 168 hours. CBC:  Recent  Labs Lab 05/14/16 1335 05/15/16 0533 05/17/16 0539  WBC 7.9 11.7* 12.5*  NEUTROABS 4.9  --  4.4  HGB 9.4* 7.5* 8.4*  HCT 27.5* 21.4* 23.3*  MCV 84.4 82.9 80.3  PLT 730* 580* 569*   Cardiac Enzymes: No results for input(s): CKTOTAL, CKMB, CKMBINDEX, TROPONINI in the last 168 hours. BNP (last 3 results) No results for input(s): BNP in the last 8760 hours.  ProBNP (last 3 results) No results for input(s): PROBNP in the last 8760 hours.  CBG: No results for input(s): GLUCAP in the last 168 hours.  No results found for this or any previous visit (from the past 240 hour(s)).    Studies: Dg Chest 2 View  05/14/2016  CLINICAL DATA:  Chest pain, bilateral leg pain, sickle cell pain EXAM: CHEST  2 VIEW COMPARISON:  02/10/2016 FINDINGS: Cardiomediastinal silhouette is stable. No acute infiltrate or pleural effusion. No pulmonary edema. Bony thorax is unremarkable. IMPRESSION: No active cardiopulmonary disease. Electronically Signed   By: Lahoma Crocker M.D.   On: 05/14/2016 13:57    Scheduled Meds: . enoxaparin (LOVENOX) injection  40 mg Subcutaneous Q24H  . HYDROmorphone   Intravenous Q4H  . ketorolac  30 mg Intravenous Q6H  . morphine  60 mg Oral Q12H   Continuous Infusions: . sodium chloride 10 mL/hr at 05/17/16 1531  . sodium chloride 75 mL/hr at 05/17/16 0803    Time spent 25 miunutes

## 2016-05-19 MED ORDER — HYDROMORPHONE HCL 2 MG/ML IJ SOLN: 2.0000 mg | INTRAMUSCULAR | Status: DC | PRN

## 2016-05-19 MED ORDER — LACTULOSE 10 GM/15ML PO SOLN
30.0000 g | Freq: Once | ORAL | Status: AC
  Administered 2016-05-19: 30 g via ORAL
  Filled 2016-05-19: qty 45

## 2016-05-19 MED ORDER — HYDROMORPHONE HCL 2 MG/ML IJ SOLN: 2.0000 mg | INTRAMUSCULAR | Status: DC

## 2016-05-19 MED ORDER — HYDROMORPHONE HCL 2 MG/ML IJ SOLN
2.0000 mg | INTRAMUSCULAR | Status: AC
Start: 2016-05-19 — End: 2016-05-20
  Administered 2016-05-19 – 2016-05-20 (×6): 2 mg via INTRAVENOUS
  Filled 2016-05-19 (×6): qty 1

## 2016-05-19 MED ORDER — HYDROMORPHONE HCL 2 MG/ML IJ SOLN
2.0000 mg | Freq: Once | INTRAMUSCULAR | Status: AC
  Administered 2016-05-19: 2 mg via INTRAVENOUS
  Filled 2016-05-19: qty 1

## 2016-05-19 MED ORDER — HYDROMORPHONE HCL 2 MG/ML IJ SOLN
2.0000 mg | INTRAMUSCULAR | Status: DC | PRN
  Administered 2016-05-20 (×4): 2 mg via INTRAVENOUS
  Filled 2016-05-19 (×4): qty 1

## 2016-05-19 NOTE — Progress Notes (Signed)
[place] PROGRESS NOTE  CYTHNIA SYSLO [id number] DOB: 1991-05-08 DOA: 05/14/2016 PCP: [name], MD  Assessment/Plan: Principal Problem:   Sickle cell pain crisis [facility]) Active Problems:   Thrombocytosis (HCC)   Hypokalemia   Sickle cell crisis ([place])   1. Hb SS with crisis: Pt reports that PCA was leaking yesterday and thus she received almost no medication via the PCA for most of the last shift. She reports her pain as 10/10 at present and localized to the back, chest and legs. Will increase her cliniciian assisted doses to 2 mg q 2 hours for 6 scheduled doses and then PRN. Continue Toradol and  PCA at current dose.  2. Anemia of Chronic Disease: Hb stable. 3. Leukocytosis: Was likely related to crisis. Resolved Anemia of Chronic Disease: Hemoglobin remains at baseline.   4. Constipation: Still no BM since admission. Will give Lactulose today.   Code Status: Full Code Family Communication: N/A Disposition Plan:Anticipate discharge in next 24-48 hours.  Sahana Boyland A.  Pager [phone]. If 7PM-7AM, please contact night-coverage.  05/19/2016, 1:39 PM  LOS: 5 days   Interim History: PtPt reports that PCA was leaking yesterday and thus she received almost no medication via the PCA for most of the last shift. She reports her pain as 10/10 at present and localized to the back, chest and legs. She has used 17.5 mg with 43/35:demands/deliveries. Reports that she's not had a BM since admission.  Consultants:  None  Procedures:  None  Antibiotics:  None   Objective: Filed Vitals:   05/18/16 2355 05/19/16 0442 05/19/16 0800 05/19/16 1119  BP:  107/71    Pulse:  59    Temp:  98.3 F (36.8 C)    TempSrc:  Oral    Resp: 14 14 10 16   Height:      Weight:      SpO2: 100% 100% 100% 100%   Weight change:   Intake/Output Summary (Last 24 hours) at 05/19/16 1339 Last data filed at 05/18/16 1850  Gross per 24 hour  Intake    365 ml  Output      0 ml   Net    365 ml    General: Alert, awake, oriented x3, in significant distress due to pain.  HEENT: [place]/AT PEERL, EOMI, anicteric. Neck: Trachea midline,  no masses, no thyromegal,y no JVD, no carotid bruit OROPHARYNX:  Moist, No exudate/ erythema/lesions.  Heart: Regular rate and rhythm, without murmurs, rubs, gallops, PMI non-displaced, no heaves or thrills on palpation.  Lungs: Clear to auscultation, no wheezing or rhonchi noted. No increased vocal fremitus resonant to percussion. Abdomen: Soft, nontender, nondistended, positive bowel sounds, no masses no hepatosplenomegaly noted.  Neuro: No focal neurological deficits noted cranial nerves II through XII grossly intact.  Strength at functional baseline in bilateral upper and lower extremities. Musculoskeletal: No warmth swelling or erythema around joints, no spinal tenderness noted. Psychiatric: Patient alert and oriented x3, somewhat irritated due to pain.    Data Reviewed: Basic Metabolic Panel:  Recent Labs Lab 05/14/16 1335 05/17/16 0539  NA 138 138  K 3.4* 3.8  CL 106 104  CO2 22 28  GLUCOSE 84 97  BUN 8 7  CREATININE 0.50 0.41*  CALCIUM 9.8 8.9   Liver Function Tests:  Recent Labs Lab 05/14/16 1335 05/17/16 0539  AST 25 22  ALT 19 14  ALKPHOS 53 52  BILITOT 2.8* 1.7*  PROT 8.8* 6.9  ALBUMIN 4.8 3.7   No results for input(s):  LIPASE, AMYLASE in the last 168 hours. No results for input(s): AMMONIA in the last 168 hours. CBC:  Recent Labs Lab 05/14/16 1335 05/15/16 0533 05/17/16 0539  WBC 7.9 11.7* 12.5*  NEUTROABS 4.9  --  4.4  HGB 9.4* 7.5* 8.4*  HCT 27.5* 21.4* 23.3*  MCV 84.4 82.9 80.3  PLT 730* 580* 569*   Cardiac Enzymes: No results for input(s): CKTOTAL, CKMB, CKMBINDEX, TROPONINI in the last 168 hours. BNP (last 3 results) No results for input(s): BNP in the last 8760 hours.  ProBNP (last 3 results) No results for input(s): PROBNP in the last 8760 hours.  CBG: No results for input(s):  GLUCAP in the last 168 hours.  No results found for this or any previous visit (from the past 240 hour(s)).   Studies: Dg Chest 2 View  05/14/2016  CLINICAL DATA:  Chest pain, bilateral leg pain, sickle cell pain EXAM: CHEST  2 VIEW COMPARISON:  02/10/2016 FINDINGS: Cardiomediastinal silhouette is stable. No acute infiltrate or pleural effusion. No pulmonary edema. Bony thorax is unremarkable. IMPRESSION: No active cardiopulmonary disease. Electronically Signed   By: Lahoma Crocker M.D.   On: 05/14/2016 13:57    Scheduled Meds: . enoxaparin (LOVENOX) injection  40 mg Subcutaneous Q24H  . HYDROmorphone   Intravenous Q4H  .  HYDROmorphone (DILAUDID) injection  2 mg Intravenous Q3H  . ketorolac  30 mg Intravenous Q6H  . morphine  60 mg Oral Q12H   Continuous Infusions: . sodium chloride 10 mL/hr at 05/17/16 1531  . sodium chloride 75 mL/hr at 05/17/16 0803    Time spent 25 miunutes

## 2016-05-19 NOTE — Progress Notes (Signed)
Left hand IV infiltratred around 2350 last night and new IV started at 0220. No pain medicine during that time.

## 2016-05-20 DIAGNOSIS — G894 Chronic pain syndrome: Secondary | ICD-10-CM

## 2016-05-20 DIAGNOSIS — D638 Anemia in other chronic diseases classified elsewhere: Secondary | ICD-10-CM

## 2016-05-20 LAB — BASIC METABOLIC PANEL
ANION GAP: 5 (ref 5–15)
BUN: 7 mg/dL (ref 6–20)
CALCIUM: 8.7 mg/dL — AB (ref 8.9–10.3)
CO2: 28 mmol/L (ref 22–32)
CREATININE: 0.4 mg/dL — AB (ref 0.44–1.00)
Chloride: 106 mmol/L (ref 101–111)
Glucose, Bld: 99 mg/dL (ref 65–99)
Potassium: 4 mmol/L (ref 3.5–5.1)
SODIUM: 139 mmol/L (ref 135–145)

## 2016-05-20 LAB — CBC WITH DIFFERENTIAL/PLATELET
BASOS ABS: 0 10*3/uL (ref 0.0–0.1)
BASOS PCT: 0 %
EOS ABS: 0.5 10*3/uL (ref 0.0–0.7)
Eosinophils Relative: 4 %
HEMATOCRIT: 22.3 % — AB (ref 36.0–46.0)
Hemoglobin: 8 g/dL — ABNORMAL LOW (ref 12.0–15.0)
Lymphocytes Relative: 38 %
Lymphs Abs: 5.2 10*3/uL — ABNORMAL HIGH (ref 0.7–4.0)
MCH: 29.2 pg (ref 26.0–34.0)
MCHC: 35.9 g/dL (ref 30.0–36.0)
MCV: 81.4 fL (ref 78.0–100.0)
MONO ABS: 1.5 10*3/uL — AB (ref 0.1–1.0)
Monocytes Relative: 11 %
NEUTROS ABS: 6.4 10*3/uL (ref 1.7–7.7)
NEUTROS PCT: 47 %
PLATELETS: 552 10*3/uL — AB (ref 150–400)
RBC: 2.74 MIL/uL — ABNORMAL LOW (ref 3.87–5.11)
RDW: 16.7 % — AB (ref 11.5–15.5)
WBC: 13.6 10*3/uL — ABNORMAL HIGH (ref 4.0–10.5)

## 2016-05-20 MED ORDER — OXYCODONE-ACETAMINOPHEN 5-325 MG PO TABS
1.0000 | ORAL_TABLET | ORAL | Status: DC
  Administered 2016-05-20 – 2016-05-22 (×12): 1 via ORAL
  Filled 2016-05-20 (×12): qty 1

## 2016-05-20 MED ORDER — OXYCODONE HCL 5 MG PO TABS
5.0000 mg | ORAL_TABLET | ORAL | Status: DC
  Administered 2016-05-20 – 2016-05-22 (×12): 5 mg via ORAL
  Filled 2016-05-20 (×12): qty 1

## 2016-05-20 MED ORDER — HYDROMORPHONE HCL 2 MG/ML IJ SOLN
2.0000 mg | INTRAMUSCULAR | Status: DC | PRN
  Administered 2016-05-20 – 2016-05-21 (×4): 2 mg via INTRAVENOUS
  Filled 2016-05-20 (×4): qty 1
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
## 2016-05-21 LAB — CREATININE, SERUM
Creatinine, Ser: 0.4 mg/dL — ABNORMAL LOW (ref 0.44–1.00)
GFR calc Af Amer: >60 mL/min (ref >60)

## 2016-05-21 MED ORDER — LACTULOSE 10 GM/15ML PO SOLN: 30.0000 g | Freq: Every day | ORAL | Status: DC | PRN

## 2016-05-21 MED ORDER — ZOLPIDEM TARTRATE 5 MG PO TABS
5.0000 mg | ORAL_TABLET | Freq: Once | ORAL | Status: AC
  Administered 2016-05-22: 5 mg via ORAL
  Filled 2016-05-21: qty 1

## 2016-05-21 MED ORDER — IBUPROFEN 800 MG PO TABS
800.0000 mg | ORAL_TABLET | Freq: Three times a day (TID) | ORAL | Status: DC
  Administered 2016-05-21 – 2016-05-22 (×4): 800 mg via ORAL
  Filled 2016-05-21 (×4): qty 1

## 2016-05-21 NOTE — Progress Notes (Signed)
This shift pt req RN contact hospitalist regarding 1x bolus dose of Dilaudid for increased pain in arms and A medication for sleep. Reminded pt that  She will be receiving midnight dose of  Percocet and oxy IR shortly. Text paged hospitalist M. Lynch, per MD's notes no additional bolus to be given for pain but order written for ambien for sleep. this information communicated to pt. Will continue to monitor pt and intervene as appropriate.

## 2016-05-21 NOTE — Progress Notes (Deleted)
SATURATION QUALIFICATIONS: (This note is used to comply with regulatory documentation for home oxygen)  Patient Saturations on Room Air at Rest = 100%  Patient Saturations on Room Air while Ambulating = 99%  Patient Saturations on 0 Liters of oxygen while Ambulating = 99%  Please briefly explain why patient needs home oxygen: 
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
## 2016-05-21 NOTE — Progress Notes (Signed)
Pt ambulated in the hall with no oxygen 300 ft. Pt oxygen saturation dropped to 98% on room air. No SOB or difficulty breathing while ambulating. Pt maintained 100 % on room air at rest following ambulation.  Solly Derasmo W Zaydin Billey, RN

## 2016-05-22 DIAGNOSIS — D473 Essential (hemorrhagic) thrombocythemia: Secondary | ICD-10-CM

## 2016-05-22 MED ORDER — OXYCODONE-ACETAMINOPHEN 10-325 MG PO TABS: 1.0000 | ORAL_TABLET | Freq: Four times a day (QID) | ORAL | Status: DC | PRN

## 2016-05-22 MED ORDER — HYDROMORPHONE HCL 1 MG/ML IJ SOLN
1.0000 mg | INTRAMUSCULAR | Status: AC | PRN
  Administered 2016-05-22 (×3): 1 mg via INTRAVENOUS
  Filled 2016-05-22: qty 1

## 2016-05-22 MED ORDER — MORPHINE SULFATE ER 60 MG PO TBCR: 60.0000 mg | EXTENDED_RELEASE_TABLET | Freq: Two times a day (BID) | ORAL | Status: DC

## 2016-05-22 NOTE — Progress Notes (Signed)
Pt leaving at this time with 2 bus passes, discharge instructions, and personal belongings.

## 2016-05-22 NOTE — Progress Notes (Signed)
Pt discharged to home. Left unit ambulatorily accompanied by nurse tech to catch bus downstairs. Pt requested a taxi voucher; but according to SW, only bus passes were allowed. Pt made aware; but insisted on obtaining a taxi voucher reporting that her sister received a taxi voucher when she got discharged from this hospital and why not her. Pt reminded that we no longer give taxi vouchers out per SW; got mad at this Probation officer and requested that this Probation officer and loudly  stated,'give me my medicine and leave my room. I need top speak with the charge nurse !". Pt medicated with scheduled pain meds and charge rn made aware of pt's request. Charge rn completed pt's discharge. VWilliams, rn.

## 2016-05-22 NOTE — Discharge Summary (Signed)
Physician Discharge Summary  Carmen [name] [id number] DOB: 1991/10/19 DOA: 05/14/2016  PCP: [name], MD  Admit date: 05/14/2016  Discharge date: 05/22/2016  Discharge Diagnoses:  Principal Problem:   Sickle cell pain crisis [facility]) Active Problems:   Thrombocytosis (HCC)   Hypokalemia   Sickle cell crisis ([place])  Discharge Condition: Stable  Disposition:  Follow-up Information    Follow up with [name], MD In 5 days.   Specialty:  Family Medicine   Why:  For Hospital Discharge F/U and Medication Refill   Contact information:   [place] [address] [phone]       Diet: Regular  Wt Readings from Last 3 Encounters:  05/14/16 74.98 kg (165 lb 4.8 oz)  04/24/16 82.2 kg (181 lb 3.5 oz)  02/10/16 79.9 kg (176 lb 2.4 oz)    History of present illness:  Carmen [name] is a 25 y.o. 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 Patient takes morphine 60 mg twice a day and oxycodone 1 tablet every 4 hours which did not improve her pain. She increased the dose of her oxycodone as was instructed by her PCP but it did not relieve her pain. She denies any shortness of breath or fever, chills, nausea, vomiting, abdominal pain, dysuria, diarrhea. Denies any sick contact. Reports her pain to be typical of her sickle cell crisis and 10/10 in severity. In the ED, vitals were stable. Blood work showed hemoglobin of 9.4 (stable), platelets of 730, potassium of 3.4, normal renal function, bilirubin of 2.8 with normal LFTs. Chest x-ray was unremarkable. she was given 2 mg IV Dilaudid, 15mg  IV toradol without any change in her pain symptoms and hospitalist admission requested.  Hospital Course:  Patient was admitted for Sickle cell disease with crisis [facility]): Pt's pain was managed with IV Dilaudid,  Toradol and IVF. As her pain improved, she was transitioned to oral analgesics. At the time of discharge, pain was well controlled with oral analgesics. She is discharged home on her pre-hospital regimen to follow up with her PCP on 05/27/2016 as scheduled. I discussed with patients PCP (Dr. Alyson Ingles) who gave permission to write 5 days medication for patient until he sees her for follow up next week. Her Hb remained at baseline throughout hospital course. No transfusions required. Patient was discharged home in a hemodynamically stable condition. She was discharged with prescription for 10 tabs of Ms Contin 60 mg tabs PO Q12H and 20 tabs of Oxycodone 10 mg.   Discharge Exam: Filed Vitals:   05/22/16 0936 05/22/16 1412  BP: 135/85 114/63  Pulse: 91 79  Temp: 98.3 F (36.8 C) 98.5 F (36.9 C)  Resp: 14 16   Filed Vitals:   05/22/16 0539 05/22/16 0800 05/22/16 0936 05/22/16 1412  BP: 126/64  135/85 114/63  Pulse: 77  91 79  Temp: 98.6 F (37 C)  98.3 F (36.8 C) 98.5 F (36.9 C)  TempSrc: Oral  Oral Oral  Resp: 17 16 14 16   Height:      Weight:      SpO2: 100%  96% 99%   General appearance : Awake, alert, not in any distress. Speech Clear. Not toxic looking HEENT: Atraumatic and Normocephalic, pupils equally reactive to light and accomodation Neck: Supple, no JVD. No cervical lymphadenopathy.  Chest: Good air entry bilaterally, no  added sounds  CVS: S1 S2 regular, no murmurs.  Abdomen: Bowel sounds present, Non tender and not distended with no gaurding, rigidity or rebound. Extremities: B/L Lower Ext shows no edema, both legs are warm to touch Neurology: Awake alert, and oriented X 3, CN II-XII intact, Non focal Skin: No Rash  Discharge Instructions     Medication List    TAKE these medications        cyclobenzaprine 10 MG tablet  Commonly known as:  FLEXERIL  Take 1 tablet (10 mg total) by mouth 2 (two) times daily as needed for muscle spasms.      HYDROcodone-acetaminophen 5-325 MG tablet  Commonly known as:  NORCO/VICODIN  Take 2 tablets by mouth every 4 (four) hours as needed.     ibuprofen 800 MG tablet  Commonly known as:  ADVIL,MOTRIN  Take 1 tablet (800 mg total) by mouth 3 (three) times daily.     morphine 60 MG 12 hr tablet  Commonly known as:  MS CONTIN  Take 1 tablet (60 mg total) by mouth every 12 (twelve) hours.     oxyCODONE-acetaminophen 10-325 MG tablet  Commonly known as:  PERCOCET  Take 1 tablet by mouth every 6 (six) hours as needed for pain.         The results of significant diagnostics from this hospitalization (including imaging, microbiology, ancillary and laboratory) are listed below for reference.    Significant Diagnostic Studies: Dg Chest 2 View  05/14/2016  CLINICAL DATA:  Chest pain, bilateral leg pain, sickle cell pain EXAM: CHEST  2 VIEW COMPARISON:  02/10/2016 FINDINGS: Cardiomediastinal silhouette is stable. No acute infiltrate or pleural effusion. No pulmonary edema. Bony thorax is unremarkable. IMPRESSION: No active cardiopulmonary disease. Electronically Signed   By: Lahoma Crocker M.D.   On: 05/14/2016 13:57    Microbiology: No results found for this or any previous visit (from the past 240 hour(s)).   Labs: Basic Metabolic Panel:  Recent Labs Lab 05/17/16 0539 05/20/16 0555 05/21/16 0546  NA 138 139  --   K 3.8 4.0  --   CL 104 106  --   CO2 28 28  --   GLUCOSE 97 99  --   BUN 7 7  --   CREATININE 0.41* 0.40* 0.40*  CALCIUM 8.9 8.7*  --    Liver Function Tests:  Recent Labs Lab 05/17/16 0539  AST 22  ALT 14  ALKPHOS 52  BILITOT 1.7*  PROT 6.9  ALBUMIN 3.7   No results for input(s): LIPASE, AMYLASE in the last 168 hours. No results for input(s): AMMONIA in the last 168 hours. CBC:  Recent Labs Lab 05/17/16 0539 05/20/16 0555  WBC 12.5* 13.6*  NEUTROABS 4.4 6.4  HGB 8.4* 8.0*  HCT 23.3* 22.3*  MCV 80.3 81.4  PLT 569* 552*   Cardiac Enzymes: No results  for input(s): CKTOTAL, CKMB, CKMBINDEX, TROPONINI in the last 168 hours. BNP: Invalid input(s): POCBNP CBG: No results for input(s): GLUCAP in the last 168 hours.  Time coordinating discharge: 50 minutes  Signed:  Camarie Mctigue, [facility] 05/22/2016, 3:39 PM

## 2016-05-26 ENCOUNTER — Emergency Department (HOSPITAL_COMMUNITY)
Admission: EM | Admit: 2016-05-26 | Discharge: 2016-05-26 | Disposition: A | Discharge status: Home / Self Care | Payer: Medicaid Other | Attending: Emergency Medicine | Admitting: Emergency Medicine

## 2016-05-26 ENCOUNTER — Encounter (HOSPITAL_COMMUNITY): Payer: Self-pay | Admitting: Emergency Medicine

## 2016-05-26 ENCOUNTER — Emergency Department (HOSPITAL_COMMUNITY): Payer: Medicaid Other

## 2016-05-26 DIAGNOSIS — Z79899 Other long term (current) drug therapy: Secondary | ICD-10-CM | POA: Diagnosis not present

## 2016-05-26 DIAGNOSIS — D5700 Hb-SS disease with crisis, unspecified: Secondary | ICD-10-CM | POA: Diagnosis present

## 2016-05-26 LAB — CBC WITH DIFFERENTIAL/PLATELET
Basophils Absolute: 0 10*3/uL (ref 0.0–0.1)
Basophils Relative: 0 %
EOS ABS: 0 10*3/uL (ref 0.0–0.7)
Eosinophils Relative: 0 %
HEMATOCRIT: 29.5 % — AB (ref 36.0–46.0)
HEMOGLOBIN: 10.2 g/dL — AB (ref 12.0–15.0)
LYMPHS ABS: 2.9 10*3/uL (ref 0.7–4.0)
LYMPHS PCT: 28 %
MCH: 28.7 pg (ref 26.0–34.0)
MCHC: 34.6 g/dL (ref 30.0–36.0)
MCV: 82.9 fL (ref 78.0–100.0)
MONOS PCT: 12 %
Monocytes Absolute: 1.2 10*3/uL — ABNORMAL HIGH (ref 0.1–1.0)
NEUTROS ABS: 6.1 10*3/uL (ref 1.7–7.7)
NEUTROS PCT: 60 %
Platelets: 591 10*3/uL — ABNORMAL HIGH (ref 150–400)
RBC: 3.56 MIL/uL — AB (ref 3.87–5.11)
RDW: 16 % — ABNORMAL HIGH (ref 11.5–15.5)
WBC: 10.3 10*3/uL (ref 4.0–10.5)

## 2016-05-26 LAB — COMPREHENSIVE METABOLIC PANEL
ALT: 22 U/L (ref 14–54)
AST: 29 U/L (ref 15–41)
Albumin: 5 g/dL (ref 3.5–5.0)
Alkaline Phosphatase: 55 U/L (ref 38–126)
Anion gap: 10 (ref 5–15)
BUN: 9 mg/dL (ref 6–20)
CHLORIDE: 109 mmol/L (ref 101–111)
CO2: 21 mmol/L — AB (ref 22–32)
CREATININE: 0.54 mg/dL (ref 0.44–1.00)
Calcium: 9.9 mg/dL (ref 8.9–10.3)
GFR calc non Af Amer: >60 mL/min (ref >60)
Glucose, Bld: 82 mg/dL (ref 65–99)
POTASSIUM: 3.6 mmol/L (ref 3.5–5.1)
SODIUM: 140 mmol/L (ref 135–145)
Total Bilirubin: 2.3 mg/dL — ABNORMAL HIGH (ref 0.3–1.2)
Total Protein: 8.9 g/dL — ABNORMAL HIGH (ref 6.5–8.1)

## 2016-05-26 LAB — RETICULOCYTES
RBC.: 3.51 MIL/uL — ABNORMAL LOW (ref 3.87–5.11)
Retic Count, Absolute: 284.3 10*3/uL — ABNORMAL HIGH (ref 19.0–186.0)
Retic Ct Pct: 8.1 % — ABNORMAL HIGH (ref 0.4–3.1)

## 2016-05-26 MED ORDER — HYDROMORPHONE HCL 2 MG/ML IJ SOLN
2.0000 mg | Freq: Once | INTRAMUSCULAR | Status: AC
  Administered 2016-05-26: 2 mg via INTRAVENOUS
  Filled 2016-05-26: qty 1

## 2016-05-26 MED ORDER — DEXTROSE-NACL 5-0.45 % IV SOLN
INTRAVENOUS | Status: DC
  Administered 2016-05-26: 125 mL/h via INTRAVENOUS

## 2016-05-26 MED ORDER — DIPHENHYDRAMINE HCL 50 MG/ML IJ SOLN
25.0000 mg | Freq: Once | INTRAMUSCULAR | Status: AC
  Administered 2016-05-26: 25 mg via INTRAVENOUS
  Filled 2016-05-26: qty 1

## 2016-05-26 MED ORDER — ONDANSETRON HCL 4 MG/2ML IJ SOLN
4.0000 mg | INTRAMUSCULAR | Status: DC | PRN
  Administered 2016-05-26: 4 mg via INTRAVENOUS
  Filled 2016-05-26: qty 2

## 2016-05-26 MED ORDER — KETOROLAC TROMETHAMINE 30 MG/ML IJ SOLN
30.0000 mg | INTRAMUSCULAR | Status: AC
  Administered 2016-05-26: 30 mg via INTRAVENOUS
  Filled 2016-05-26: qty 1

## 2016-05-26 NOTE — ED Triage Notes (Signed)
Patient c/o back pain that has been over week.  Patient states that she was discharged from hospital for sickle cell on 7/14.  Patient has PCP appt tomorrow but pain to severe to wait until then.

## 2016-05-26 NOTE — ED Notes (Signed)
Informed the pt that a urine specimen is needed. 

## 2016-05-26 NOTE — ED Notes (Signed)
Patient took all belongings with her. 

## 2016-05-26 NOTE — ED Provider Notes (Signed)
[facility] DEPT Provider Note   CSN: [id number] Arrival date & time: 05/26/16  1446  First Provider Contact:  First MD Initiated Contact with Patient 05/26/16 1858     History   Chief Complaint Chief Complaint  Patient presents with  . Sickle Cell Pain Crisis  . Back Pain    HPI Carmen [name] is a 25 y.o. female.  HPI 24 y.o. female with a hx of Sickle Cell Disease, presents to the Emergency Department today complaining of sickle cell pain with associated back pain. Pt states this is typical pain for her. Noted recent DC on 05/22/16 for similar due to pain control. Notes home medication of Ms Contin and Percocet have not been working. Notes CP without SOB. No fevers. No N/V/D. No numbness/tingling. No loss of bowel or bladder function. No saddle anesthesia. Notes pain is 10/10 and aching. No other symptoms noted. Has PCP appointment tomorrow.   Past Medical History:  Diagnosis Date  . Abortion    05/2012  . Headache(784.0)   . Sickle cell crisis [facility])     Patient Active Problem List   Diagnosis Date Noted  . Anemia of chronic disease   . Acute URI 02/10/2016  . Sickle-cell crisis ([place]) 12/19/2015  . Sickle cell anemia with crisis ([place]) 12/19/2015  . Chest pain 11/10/2015  . 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 Sickle cell disease with crisis ([place]) 09/09/2015  . Dental caries   . Pain in a tooth or teeth   . Right facial swelling   . HCAP (healthcare-associated pneumonia) 02/28/2015  . Sickle cell crisis ([place]) 02/28/2015  . Sickle cell anemia with pain ([place]) 02/16/2015  . 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 Acute calculous cholecystitis s/p lap chole 11/30/2014 11/30/2014  . Hypokalemia 11/28/2014  . Atypical chest pain 11/28/2014  . Leukocytosis 11/22/2014  . Thrombocytosis ([place]) 11/22/2014  . Anemia 05/16/2014  . Migraine, unspecified, without mention of intractable migraine without mention of status migrainosus 05/16/2014    . Sickle cell pain crisis ([place]) 11/12/2013    Past Surgical History:  Procedure Laterality Date  . CHOLECYSTECTOMY N/A 11/30/2014   Procedure: LAPAROSCOPIC CHOLECYSTECTOMY SINGLE SITE WITH INTRAOPERATIVE CHOLANGIOGRAM;  Surgeon: Michael Boston, MD;  Location: [facility] ORS;  Service: General;  Laterality: N/A;  . SPLENECTOMY      OB History    Gravida Para Term Preterm AB Living   1       1     SAB TAB Ectopic Multiple Live Births                 Home Medications    Prior to Admission medications   Medication Sig Start Date End Date Taking? Authorizing Provider  morphine (MS CONTIN) 60 MG 12 hr tablet Take 1 tablet (60 mg total) by mouth every 12 (twelve) hours. 05/22/16  Yes Tresa Garter, MD  oxyCODONE-acetaminophen (PERCOCET) 10-325 MG tablet Take 1 tablet by mouth every 6 (six) hours as needed for pain. 05/22/16  Yes Tresa Garter, MD  cyclobenzaprine (FLEXERIL) 10 MG tablet Take 1 tablet (10 mg total) by mouth 2 (two) times daily as needed for muscle spasms. Patient not taking: Reported on 05/14/2016 03/03/16   Delsa Grana, PA-C  HYDROcodone-acetaminophen (NORCO/VICODIN) 5-325 MG tablet Take 2 tablets by mouth every 4 (four) hours as needed. Patient not taking: Reported on 05/14/2016 03/03/16   Delsa Grana, PA-C  ibuprofen (ADVIL,MOTRIN) 800 MG tablet Take 1 tablet (800 mg  total) by mouth 3 (three) times daily. Patient not taking: Reported on 05/14/2016 03/03/16   Delsa Grana, PA-C    Family History Family History  Problem Relation Age of Onset  . Hypertension Mother   . Sickle cell anemia Sister   . Kidney disease Sister     Lupus  . Arthritis Sister   . Sickle cell anemia Sister   . Sickle cell trait Sister   . Heart disease Maternal Aunt   . Heart disease Maternal Uncle     Social History Social History  Substance Use Topics  . Smoking status: Never Smoker  . Smokeless tobacco: Never Used  . Alcohol use No     Allergies   Review of patient's allergies indicates  no known allergies.   Review of Systems Review of Systems ROS reviewed and all are negative for acute change except as noted in the HPI.  Physical Exam Updated Vital Signs BP 121/82 (BP Location: Right Arm)   Pulse 99   Temp 98.8 F (37.1 C) (Oral)   Resp 18   Ht 5\' 3"  (1.6 m)   Wt 79.4 kg   LMP 04/29/2016   SpO2 100%   BMI 31.00 kg/m   Physical Exam  Constitutional: She is oriented to person, place, and time. Vital signs are normal. She appears well-developed and well-nourished.  HENT:  Head: Normocephalic.  Right Ear: Hearing normal.  Left Ear: Hearing normal.  Eyes: Conjunctivae and EOM are normal. Pupils are equal, round, and reactive to light.  Neck: Normal range of motion. Neck supple.  Cardiovascular: Normal rate, regular rhythm, normal heart sounds and intact distal pulses.   No murmur heard. Pulmonary/Chest: Effort normal and breath sounds normal. No respiratory distress. She has no wheezes. She has no rales. She exhibits no tenderness.  Abdominal: Soft. There is no tenderness.  Musculoskeletal: Normal range of motion.  ROM of C/T/L spine intact. Pt able to ambulate.  Neurological: She is alert and oriented to person, place, and time. She has normal strength. No sensory deficit.  Skin: Skin is warm and dry.  Psychiatric: She has a normal mood and affect. Her speech is normal and behavior is normal. Thought content normal.   ED Treatments / Results  Labs (all labs ordered are listed, but only abnormal results are displayed) Labs Reviewed  COMPREHENSIVE METABOLIC PANEL - Abnormal; Notable for the following:       Result Value   CO2 21 (*)    Total Protein 8.9 (*)    Total Bilirubin 2.3 (*)    All other components within normal limits  CBC WITH DIFFERENTIAL/PLATELET - Abnormal; Notable for the following:    RBC 3.56 (*)    Hemoglobin 10.2 (*)    HCT 29.5 (*)    RDW 16.0 (*)    Platelets 591 (*)    Monocytes Absolute 1.2 (*)    All other components within  normal limits  RETICULOCYTES - Abnormal; Notable for the following:    Retic Ct Pct 8.1 (*)    RBC. 3.51 (*)    Retic Count, Manual 284.3 (*)    All other components within normal limits  URINALYSIS, ROUTINE W REFLEX MICROSCOPIC (NOT AT [facility])  POC URINE PREG, ED    EKG  EKG Interpretation None      Radiology Dg Chest 2 View  Result Date: 05/26/2016 CLINICAL DATA:  Back pain for greater than 1 week. Reason sickle cell crisis. Recurrent severe back pain. EXAM: CHEST  2 VIEW COMPARISON:  Two-view chest x-ray 05/14/2016 FINDINGS: The heart size and mediastinal contours are within normal limits. Both lungs are clear. The visualized skeletal structures are unremarkable. IMPRESSION: Negative two view chest x-ray Electronically Signed   By: San Morelle M.D.   On: 05/26/2016 19:33   Procedures Procedures (including critical care time)  Medications Ordered in ED Medications  dextrose 5 %-0.45 % sodium chloride infusion (not administered)  ketorolac (TORADOL) 30 MG/ML injection 30 mg (not administered)  diphenhydrAMINE (BENADRYL) injection 25 mg (not administered)  ondansetron (ZOFRAN) injection 4 mg (not administered)  HYDROmorphone (DILAUDID) injection 2 mg (not administered)   Initial Impression / Assessment and Plan / ED Course  I have reviewed the triage vital signs and the nursing notes.  Pertinent labs & imaging results that were available during my care of the patient were reviewed by me and considered in my medical decision making (see chart for details).  Clinical Course   Final Clinical Impressions(s) / ED Diagnoses  I have reviewed and evaluated the relevant laboratory valuesI have reviewed and evaluated the relevant imaging studies. I have interpreted the relevant EKG.I have reviewed the relevant previous healthcare records. I obtained HPI from historian.  ED Course:  Assessment: Pt is a 25yF with hx Sickle Cell Disease who presents with sickle pain crisis.  Recent DC on 7/22 for similar. Pain today typical for sickle pain. Mainly in back. No loss of bowel or bladder function. No saddle anesthesia. On exam, pt in NAD. Nontoxic/nonseptic appearing. VSS. Afebrile. Lungs CTA. Heart RRR. Abdomen nontender soft. Labs unremarkable. CXR unremarkable. Given dilaudid x 3, Toradol, benadryl, Zofran in ED with improved symptoms. Has PCP appointment tomorrow. Plan is to DC home At time of discharge, Patient is in no acute distress. Vital Signs are stable. Patient is able to ambulate. Patient able to tolerate PO.    Disposition/Plan:  DC Home Additional Verbal discharge instructions given and discussed with patient.  Pt Instructed to f/u with PCP in the next week for evaluation and treatment of symptoms. Return precautions given Pt acknowledges and agrees with plan  Supervising Physician Daleen Bo, MD   Final diagnoses:  Sickle cell pain crisis [facility])    New Prescriptions New Prescriptions   No medications on file     Shary Decamp, PA-C 05/26/16 2153    Daleen Bo, MD 06/01/16 [phone]

## 2016-05-26 NOTE — Discharge Instructions (Signed)
Please read and follow all provided instructions.  Your diagnoses today include:  1. Sickle cell pain crisis ([place])     Tests performed today include: Vital signs. See below for your results today.   Medications prescribed:  Take as prescribed   Home care instructions:  Follow any educational materials contained in this packet.  Follow-up instructions: Please follow-up with your primary care provider at your scheduled appointment tomorrow.   Return instructions:  Please return to the Emergency Department if you do not get better, if you get worse, or new symptoms OR  - Fever (temperature greater than 101.68F)  - Bleeding that does not stop with holding pressure to the area    -Severe pain (please note that you may be more sore the day after your accident)  - Chest Pain  - Difficulty breathing  - Severe nausea or vomiting  - Inability to tolerate food and liquids  - Passing out  - Skin becoming red around your wounds  - Change in mental status (confusion or lethargy)  - New numbness or weakness    Please return if you have any other emergent concerns.  Additional Information:  Your vital signs today were: BP 140/73    Pulse 104    Temp 98.8 F (37.1 C) (Oral)    Resp 15    Ht 5\' 3"  (1.6 m)    Wt 79.4 kg    LMP 04/29/2016    SpO2 100%    BMI 31.00 kg/m  If your blood pressure (BP) was elevated above 135/85 this visit, please have this repeated by your doctor within one month. ---------------

## 2016-06-14 ENCOUNTER — Inpatient Hospital Stay (HOSPITAL_COMMUNITY)
Admission: EM | Admit: 2016-06-14 | Discharge: 2016-06-19 | DRG: 157 | Disposition: A | Discharge status: Home / Self Care | Payer: Medicaid Other | Attending: Internal Medicine | Admitting: Internal Medicine

## 2016-06-14 ENCOUNTER — Encounter (HOSPITAL_COMMUNITY): Payer: Self-pay

## 2016-06-14 DIAGNOSIS — Z9049 Acquired absence of other specified parts of digestive tract: Secondary | ICD-10-CM

## 2016-06-14 DIAGNOSIS — Z8249 Family history of ischemic heart disease and other diseases of the circulatory system: Secondary | ICD-10-CM | POA: Diagnosis not present

## 2016-06-14 DIAGNOSIS — K029 Dental caries, unspecified: Secondary | ICD-10-CM | POA: Diagnosis present

## 2016-06-14 DIAGNOSIS — K047 Periapical abscess without sinus: Principal | ICD-10-CM | POA: Diagnosis present

## 2016-06-14 DIAGNOSIS — D5700 Hb-SS disease with crisis, unspecified: Secondary | ICD-10-CM | POA: Diagnosis present

## 2016-06-14 DIAGNOSIS — Z9081 Acquired absence of spleen: Secondary | ICD-10-CM | POA: Diagnosis not present

## 2016-06-14 DIAGNOSIS — Z832 Family history of diseases of the blood and blood-forming organs and certain disorders involving the immune mechanism: Secondary | ICD-10-CM

## 2016-06-14 DIAGNOSIS — E860 Dehydration: Secondary | ICD-10-CM | POA: Diagnosis present

## 2016-06-14 DIAGNOSIS — D72829 Elevated white blood cell count, unspecified: Secondary | ICD-10-CM | POA: Diagnosis present

## 2016-06-14 LAB — COMPREHENSIVE METABOLIC PANEL
ALK PHOS: 62 U/L (ref 38–126)
ALT: 12 U/L — AB (ref 14–54)
ANION GAP: 7 (ref 5–15)
AST: 20 U/L (ref 15–41)
Albumin: 4.7 g/dL (ref 3.5–5.0)
BILIRUBIN TOTAL: 2.2 mg/dL — AB (ref 0.3–1.2)
BUN: 8 mg/dL (ref 6–20)
CHLORIDE: 105 mmol/L (ref 101–111)
CO2: 25 mmol/L (ref 22–32)
Calcium: 9.5 mg/dL (ref 8.9–10.3)
Creatinine, Ser: 0.61 mg/dL (ref 0.44–1.00)
GFR calc non Af Amer: >60 mL/min (ref >60)
GLUCOSE: 90 mg/dL (ref 65–99)
POTASSIUM: 3.6 mmol/L (ref 3.5–5.1)
SODIUM: 137 mmol/L (ref 135–145)
TOTAL PROTEIN: 8.5 g/dL — AB (ref 6.5–8.1)

## 2016-06-14 LAB — CBC WITH DIFFERENTIAL/PLATELET
BASOS ABS: 0 10*3/uL (ref 0.0–0.1)
Basophils Relative: 0 %
Eosinophils Absolute: 0.2 10*3/uL (ref 0.0–0.7)
Eosinophils Relative: 1 %
HEMATOCRIT: 26.2 % — AB (ref 36.0–46.0)
Hemoglobin: 9.1 g/dL — ABNORMAL LOW (ref 12.0–15.0)
LYMPHS ABS: 2.7 10*3/uL (ref 0.7–4.0)
LYMPHS PCT: 20 %
MCH: 28.9 pg (ref 26.0–34.0)
MCHC: 34.7 g/dL (ref 30.0–36.0)
MCV: 83.2 fL (ref 78.0–100.0)
MONO ABS: 1.3 10*3/uL — AB (ref 0.1–1.0)
Monocytes Relative: 10 %
NEUTROS ABS: 9.3 10*3/uL — AB (ref 1.7–7.7)
Neutrophils Relative %: 69 %
Platelets: 626 10*3/uL — ABNORMAL HIGH (ref 150–400)
RBC: 3.15 MIL/uL — AB (ref 3.87–5.11)
RDW: 16.4 % — ABNORMAL HIGH (ref 11.5–15.5)
WBC: 13.5 10*3/uL — AB (ref 4.0–10.5)

## 2016-06-14 LAB — URINALYSIS, ROUTINE W REFLEX MICROSCOPIC
BILIRUBIN URINE: NEGATIVE
Glucose, UA: NEGATIVE mg/dL
HGB URINE DIPSTICK: NEGATIVE
KETONES UR: NEGATIVE mg/dL
LEUKOCYTES UA: NEGATIVE
Nitrite: NEGATIVE
PROTEIN: NEGATIVE mg/dL
Specific Gravity, Urine: 1.013 (ref 1.005–1.030)
pH: 6.5 (ref 5.0–8.0)

## 2016-06-14 MED ORDER — CLINDAMYCIN PHOSPHATE 600 MG/50ML IV SOLN
600.0000 mg | Freq: Once | INTRAVENOUS | Status: AC
  Administered 2016-06-14: 600 mg via INTRAVENOUS
  Filled 2016-06-14: qty 50

## 2016-06-14 MED ORDER — MORPHINE SULFATE ER 30 MG PO TBCR
60.0000 mg | EXTENDED_RELEASE_TABLET | Freq: Two times a day (BID) | ORAL | Status: DC
  Administered 2016-06-14 – 2016-06-19 (×10): 60 mg via ORAL
  Filled 2016-06-14 (×11): qty 2

## 2016-06-14 MED ORDER — HYDROMORPHONE HCL 2 MG/ML IJ SOLN
2.0000 mg | Freq: Once | INTRAMUSCULAR | Status: AC
  Administered 2016-06-14: 2 mg via INTRAVENOUS
  Filled 2016-06-14: qty 1

## 2016-06-14 MED ORDER — KETOROLAC TROMETHAMINE 30 MG/ML IJ SOLN
30.0000 mg | INTRAMUSCULAR | Status: AC
  Administered 2016-06-14: 30 mg via INTRAVENOUS
  Filled 2016-06-14: qty 1

## 2016-06-14 MED ORDER — CLINDAMYCIN PHOSPHATE 300 MG/50ML IV SOLN
300.0000 mg | Freq: Four times a day (QID) | INTRAVENOUS | Status: DC
  Administered 2016-06-14 – 2016-06-17 (×12): 300 mg via INTRAVENOUS
  Filled 2016-06-14 (×13): qty 50

## 2016-06-14 MED ORDER — SODIUM CHLORIDE 0.9 % IV SOLN
25.0000 mg | INTRAVENOUS | Status: DC | PRN
  Filled 2016-06-14: qty 0.5

## 2016-06-14 MED ORDER — KETOROLAC TROMETHAMINE 30 MG/ML IJ SOLN
30.0000 mg | Freq: Four times a day (QID) | INTRAMUSCULAR | Status: AC
  Administered 2016-06-14 – 2016-06-19 (×20): 30 mg via INTRAVENOUS
  Filled 2016-06-14 (×21): qty 1

## 2016-06-14 MED ORDER — HYDROMORPHONE 1 MG/ML IV SOLN
INTRAVENOUS | Status: DC
Start: 2016-06-14 — End: 2016-06-19
  Administered 2016-06-14: 18:00:00 via INTRAVENOUS
  Administered 2016-06-15: 2.4 mg via INTRAVENOUS
  Administered 2016-06-15: 3 mg via INTRAVENOUS
  Administered 2016-06-15: 4.8 mg via INTRAVENOUS
  Administered 2016-06-15: 3.6 mg via INTRAVENOUS
  Administered 2016-06-16: 12.2 mg via INTRAVENOUS
  Administered 2016-06-16: 34 mL via INTRAVENOUS
  Administered 2016-06-16: 6 mg via INTRAVENOUS
  Administered 2016-06-16: 16:00:00 via INTRAVENOUS
  Administered 2016-06-16: 2.4 mL via INTRAVENOUS
  Administered 2016-06-17: 6 mg via INTRAVENOUS
  Administered 2016-06-17: 1.8 mg via INTRAVENOUS
  Administered 2016-06-17: 10:00:00 via INTRAVENOUS
  Administered 2016-06-17: 11.4 mg via INTRAVENOUS
  Administered 2016-06-17: 3.54 mg via INTRAVENOUS
  Administered 2016-06-18: 7.5 mg via INTRAVENOUS
  Administered 2016-06-18: 4.8 mg via INTRAVENOUS
  Administered 2016-06-18: 4.2 mg via INTRAVENOUS
  Administered 2016-06-18: 02:00:00 via INTRAVENOUS
  Administered 2016-06-19: 3 mg via INTRAVENOUS
  Administered 2016-06-19: 9 mg via INTRAVENOUS
  Administered 2016-06-19: 4.8 mg via INTRAVENOUS
  Administered 2016-06-19: 1.8 mg via INTRAVENOUS
  Administered 2016-06-19: 6 mg via INTRAVENOUS
  Administered 2016-06-19: 11:00:00 via INTRAVENOUS
  Filled 2016-06-14 (×8): qty 25

## 2016-06-14 MED ORDER — SENNOSIDES-DOCUSATE SODIUM 8.6-50 MG PO TABS
1.0000 | ORAL_TABLET | Freq: Two times a day (BID) | ORAL | Status: DC
  Administered 2016-06-14 – 2016-06-19 (×10): 1 via ORAL
  Filled 2016-06-14 (×10): qty 1

## 2016-06-14 MED ORDER — DIPHENHYDRAMINE HCL 25 MG PO CAPS: 25.0000 mg | ORAL_CAPSULE | ORAL | Status: DC | PRN

## 2016-06-14 MED ORDER — ENOXAPARIN SODIUM 40 MG/0.4ML ~~LOC~~ SOLN
40.0000 mg | SUBCUTANEOUS | Status: DC
  Filled 2016-06-14 (×3): qty 0.4

## 2016-06-14 MED ORDER — DEXTROSE-NACL 5-0.45 % IV SOLN
INTRAVENOUS | Status: DC
  Administered 2016-06-14 – 2016-06-19 (×10): via INTRAVENOUS

## 2016-06-14 MED ORDER — ONDANSETRON HCL 4 MG/2ML IJ SOLN: 4.0000 mg | Freq: Four times a day (QID) | INTRAMUSCULAR | Status: DC | PRN

## 2016-06-14 MED ORDER — NALOXONE HCL 0.4 MG/ML IJ SOLN: 0.4000 mg | INTRAMUSCULAR | Status: DC | PRN

## 2016-06-14 MED ORDER — SODIUM CHLORIDE 0.45 % IV SOLN
INTRAVENOUS | Status: DC
  Administered 2016-06-14: 12:00:00 via INTRAVENOUS

## 2016-06-14 MED ORDER — SODIUM CHLORIDE 0.9% FLUSH: 9.0000 mL | INTRAVENOUS | Status: DC | PRN

## 2016-06-14 MED ORDER — POLYETHYLENE GLYCOL 3350 17 G PO PACK: 17.0000 g | PACK | Freq: Every day | ORAL | Status: DC | PRN

## 2016-06-14 NOTE — Progress Notes (Signed)
Pt has only used pca one time in four hours.  States Dr. Jonelle Sidle said it would interfere with her antibiotics.  Her pain level is still a 9/10. Encouraged to use PCA for SCC pain and mouth pain. Carmen [name] P Carmen [name]

## 2016-06-14 NOTE — ED Triage Notes (Signed)
Pt c/o increasing R facial swelling and R dental abscess x 1 week and Bilateral shoulder pain radiating into lower back r/t Sickle Cell Crisis x 2 days.  Pain score 10/10.  Pt reports taking all medications as prescribed and attempting to stay hydrated.  Significant facial swelling noted.

## 2016-06-14 NOTE — ED Provider Notes (Signed)
[facility] DEPT Provider Note   CSN: [id number] Arrival date & time: 06/14/16  1019     History   Chief Complaint Chief Complaint  Patient presents with  . Dental Abscess  . Sickle Cell Pain Crisis  . Back Pain    HPI Carmen [name] is a 25 y.o. female.  The history is provided by the patient.  Sickle Cell Pain Crisis  Associated symptoms: no chest pain, no fatigue, no headaches, no nausea, no shortness of breath, no sore throat and no vomiting   Back Pain   Pertinent negatives include no chest pain, no numbness, no headaches, no abdominal pain and no weakness.  Patient presents with sickle cell pain crisis. She had it for the last 2 days. She is on morphine at home at 60 mg twice a day and oxycodone 10 for breakthrough pain. States she took the morphine last night has not had it yesterday.   she did not need to take it in the coming to the ER. She states she may feel little dehydrated. States she has had pain eating because of a dental abscess. No chest pain. No difficult breathing. Her pain is in her back which is her typical site for her sickle cell pain. She sees Dr. Noah Delaine.  Patient also has a right-sided lower dental abscess. Began around a week ago. Has had more swelling. No fevers. Some pain with swallowing but no difficulty swallowing. No difficulty breathing. She has a bad tooth there.  Past Medical History:  Diagnosis Date  . Abortion    05/2012  . Headache(784.0)   . Sickle cell crisis [facility])     Patient Active Problem List   Diagnosis Date Noted  . Abscess, dental 06/14/2016  . Anemia of chronic disease   . Acute URI 02/10/2016  . Sickle-cell crisis ([place]) 12/19/2015  . Sickle cell anemia with crisis ([place]) 12/19/2015  . Chest pain 11/10/2015  . Hb-SS disease with crisis ([place]) 10/17/2015  . Sickle cell disease with crisis ([place]) 09/09/2015  . Dental caries   . Pain in a tooth or teeth   . Right facial swelling   . HCAP (healthcare-associated  pneumonia) 02/28/2015  . Sickle cell crisis ([place]) 02/28/2015  . Sickle cell anemia with pain ([place]) 02/16/2015  . 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 Acute calculous cholecystitis s/p lap chole 11/30/2014 11/30/2014  . Hypokalemia 11/28/2014  . Atypical chest pain 11/28/2014  . Leukocytosis 11/22/2014  . Thrombocytosis ([place]) 11/22/2014  . Anemia 05/16/2014  . Migraine, unspecified, without mention of intractable migraine without mention of status migrainosus 05/16/2014  . Sickle cell pain crisis ([place]) 11/12/2013    Past Surgical History:  Procedure Laterality Date  . CHOLECYSTECTOMY N/A 11/30/2014   Procedure: LAPAROSCOPIC CHOLECYSTECTOMY SINGLE SITE WITH INTRAOPERATIVE CHOLANGIOGRAM;  Surgeon: Michael Boston, MD;  Location: [facility] ORS;  Service: General;  Laterality: N/A;  . SPLENECTOMY      OB History    Gravida Para Term Preterm AB Living   1       1     SAB TAB Ectopic Multiple Live Births                   Home Medications    Prior to Admission medications   Medication Sig Start Date End Date Taking? Authorizing Provider  ibuprofen (ADVIL,MOTRIN) 800 MG tablet Take 1 tablet (800 mg total) by mouth 3 (three) times daily. Patient taking differently: Take 800 mg by mouth  3 (three) times daily as needed for moderate pain.  03/03/16  Yes Delsa Grana, PA-C  morphine (MS CONTIN) 60 MG 12 hr tablet Take 1 tablet (60 mg total) by mouth every 12 (twelve) hours. 05/22/16  Yes Tresa Garter, MD  oxyCODONE-acetaminophen (PERCOCET) 10-325 MG tablet Take 1 tablet by mouth every 6 (six) hours as needed for pain. 05/22/16  Yes Tresa Garter, MD  cyclobenzaprine (FLEXERIL) 10 MG tablet Take 1 tablet (10 mg total) by mouth 2 (two) times daily as needed for muscle spasms. Patient not taking: Reported on 05/14/2016 03/03/16   Delsa Grana, PA-C  HYDROcodone-acetaminophen (NORCO/VICODIN) 5-325 MG tablet Take 2 tablets by mouth every 4 (four) hours as needed. Patient  not taking: Reported on 05/14/2016 03/03/16   Delsa Grana, PA-C    Family History Family History  Problem Relation Age of Onset  . Hypertension Mother   . Sickle cell anemia Sister   . Kidney disease Sister     Lupus  . Arthritis Sister   . Sickle cell anemia Sister   . Sickle cell trait Sister   . Heart disease Maternal Aunt   . Heart disease Maternal Uncle     Social History Social History  Substance Use Topics  . Smoking status: Never Smoker  . Smokeless tobacco: Never Used  . Alcohol use No     Allergies   Review of patient's allergies indicates no known allergies.   Review of Systems Review of Systems  Constitutional: Negative for activity change, appetite change, chills and fatigue.  HENT: Positive for dental problem and facial swelling. Negative for sore throat, trouble swallowing and voice change.   Eyes: Negative for pain.  Respiratory: Negative for chest tightness and shortness of breath.   Cardiovascular: Negative for chest pain and leg swelling.  Gastrointestinal: Negative for abdominal pain, diarrhea, nausea and vomiting.  Genitourinary: Negative for flank pain.  Musculoskeletal: Positive for back pain. Negative for neck stiffness.  Skin: Negative for rash.  Neurological: Negative for weakness, numbness and headaches.  Psychiatric/Behavioral: Negative for behavioral problems.     Physical Exam Updated Vital Signs BP 110/71 (BP Location: Right Arm)   Pulse 80   Temp 99 F (37.2 C) (Oral)   Resp 19   Ht 5\' 4"  (1.626 m)   Wt 170 lb (77.1 kg)   LMP 06/11/2016   SpO2 97%   BMI 29.18 kg/m   Physical Exam  Constitutional: She appears well-developed.  HENT:  Head: Atraumatic.  Swelling of right lower face. For tooth from the midline broken off. There is swelling to this laterally. No swelling or floor the mouth. No clear drainable abscess. There is firm swelling of the cheek.  Eyes: EOM are normal.  Neck: Neck supple.  Cardiovascular: Normal rate.    Pulmonary/Chest: Effort normal.  Abdominal: Soft. There is no tenderness.  Neurological: She is alert.  Skin: Skin is warm.  Psychiatric: She has a normal mood and affect.     ED Treatments / Results  Labs (all labs ordered are listed, but only abnormal results are displayed) Labs Reviewed  COMPREHENSIVE METABOLIC PANEL - Abnormal; Notable for the following:       Result Value   Total Protein 8.5 (*)    ALT 12 (*)    Total Bilirubin 2.2 (*)    All other components within normal limits  CBC WITH DIFFERENTIAL/PLATELET - Abnormal; Notable for the following:    WBC 13.5 (*)    RBC 3.15 (*)  Hemoglobin 9.1 (*)    HCT 26.2 (*)    RDW 16.4 (*)    Platelets 626 (*)    Neutro Abs 9.3 (*)    Monocytes Absolute 1.3 (*)    All other components within normal limits  URINALYSIS, ROUTINE W REFLEX MICROSCOPIC (NOT AT [facility])    EKG  EKG Interpretation None       Radiology No results found.  Procedures Procedures (including critical care time)  Medications Ordered in ED Medications  0.45 % sodium chloride infusion ( Intravenous New Bag/Given 06/14/16 1206)  HYDROmorphone (DILAUDID) injection 2 mg (not administered)  ketorolac (TORADOL) 30 MG/ML injection 30 mg (30 mg Intravenous Given 06/14/16 1154)  HYDROmorphone (DILAUDID) injection 2 mg (2 mg Intravenous Given 06/14/16 1154)  clindamycin (CLEOCIN) IVPB 600 mg (0 mg Intravenous Stopped 06/14/16 1250)  HYDROmorphone (DILAUDID) injection 2 mg (2 mg Intravenous Given 06/14/16 1248)     Initial Impression / Assessment and Plan / ED Course  I have reviewed the triage vital signs and the nursing notes.  Pertinent labs & imaging results that were available during my care of the patient were reviewed by me and considered in my medical decision making (see chart for details).  Clinical Course    Patient was sickle cell pain and dental abscess. Continued pain after treatment with IV Dilaudid. Will admit the sickle cell  team.  Final Clinical Impressions(s) / ED Diagnoses   Final diagnoses:  Sickle cell anemia with crisis [facility])  Dental abscess    New Prescriptions New Prescriptions   No medications on file     Davonna Belling, MD 06/14/16 1426

## 2016-06-14 NOTE — ED Notes (Signed)
Unable to locate vein after placing tourniquet on each arm. Charge made aware and will attempt an ultrasound IV.

## 2016-06-14 NOTE — H&P (Signed)
Carmen [name] is an 25 y.o. female.   Chief Complaint: pain all over  As well as right teeth pain HPI: this is a 25 year old female well known to our service who presented to the emergency room with right lower dental pain and swelling. She has dental abscess  On the same side she had a similar infection weeks ago.  Associated with that  She had lower back pain and lower extremity  Pain. The pain is rated as 9 out of 10  Consistent with her previous sickle cell crisis. She denied any nausea or vomiting no diarrhea. She has taken her home medications without any relief.  Patient has some fever at home with mild chills but is afebrile here.  Past Medical History:  Diagnosis Date  . Abortion    05/2012  . Headache(784.0)   . Sickle cell crisis [facility])     Past Surgical History:  Procedure Laterality Date  . CHOLECYSTECTOMY N/A 11/30/2014   Procedure: LAPAROSCOPIC CHOLECYSTECTOMY SINGLE SITE WITH INTRAOPERATIVE CHOLANGIOGRAM;  Surgeon: Michael Boston, MD;  Location: [facility];  Service: General;  Laterality: N/A;  . SPLENECTOMY      Family History  Problem Relation Age of Onset  . Hypertension Mother   . Sickle cell anemia Sister   . Kidney disease Sister     Lupus  . Arthritis Sister   . Sickle cell anemia Sister   . Sickle cell trait Sister   . Heart disease Maternal Aunt   . Heart disease Maternal Uncle    Social History:  reports that she has never smoked. She has never used smokeless tobacco. She reports that she does not drink alcohol or use drugs.  Allergies: No Known Allergies   (Not in a hospital admission)  Results for orders placed or performed during the hospital encounter of 06/14/16 (from the past 48 hour(s))  Urinalysis, Routine w reflex microscopic (not at [facility])     Status: None   Collection Time: 06/14/16 11:01 AM  Result Value Ref Range   Color, Urine YELLOW YELLOW   APPearance CLEAR CLEAR   Specific Gravity, Urine 1.013 1.005 - 1.030   pH 6.5 5.0 - 8.0   Glucose,  UA NEGATIVE NEGATIVE mg/dL   Hgb urine dipstick NEGATIVE NEGATIVE   Bilirubin Urine NEGATIVE NEGATIVE   Ketones, ur NEGATIVE NEGATIVE mg/dL   Protein, ur NEGATIVE NEGATIVE mg/dL   Nitrite NEGATIVE NEGATIVE   Leukocytes, UA NEGATIVE NEGATIVE    Comment: MICROSCOPIC NOT DONE ON URINES WITH NEGATIVE PROTEIN, BLOOD, LEUKOCYTES, NITRITE, OR GLUCOSE <1000 mg/dL.  Comprehensive metabolic panel     Status: Abnormal   Collection Time: 06/14/16 11:48 AM  Result Value Ref Range   Sodium 137 135 - 145 mmol/L   Potassium 3.6 3.5 - 5.1 mmol/L   Chloride 105 101 - 111 mmol/L   CO2 25 22 - 32 mmol/L   Glucose, Bld 90 65 - 99 mg/dL   BUN 8 6 - 20 mg/dL   Creatinine, Ser 0.61 0.44 - 1.00 mg/dL   Calcium 9.5 8.9 - 10.3 mg/dL   Total Protein 8.5 (H) 6.5 - 8.1 g/dL   Albumin 4.7 3.5 - 5.0 g/dL   AST 20 15 - 41 U/L   ALT 12 (L) 14 - 54 U/L   Alkaline Phosphatase 62 38 - 126 U/L   Total Bilirubin 2.2 (H) 0.3 - 1.2 mg/dL   GFR calc non Af Amer >60 >60 mL/min   GFR calc Af Amer >60 >60 mL/min  Comment: (NOTE) The eGFR has been calculated using the CKD EPI equation. This calculation has not been validated in all clinical situations. eGFR's persistently <60 mL/min signify possible Chronic Kidney Disease.    Anion gap 7 5 - 15  CBC WITH DIFFERENTIAL     Status: Abnormal   Collection Time: 06/14/16 11:48 AM  Result Value Ref Range   WBC 13.5 (H) 4.0 - 10.5 K/uL   RBC 3.15 (L) 3.87 - 5.11 MIL/uL   Hemoglobin 9.1 (L) 12.0 - 15.0 g/dL   HCT 26.2 (L) 36.0 - 46.0 %   MCV 83.2 78.0 - 100.0 fL   MCH 28.9 26.0 - 34.0 pg   MCHC 34.7 30.0 - 36.0 g/dL   RDW 16.4 (H) 11.5 - 15.5 %   Platelets 626 (H) 150 - 400 K/uL   Neutrophils Relative % 69 %   Neutro Abs 9.3 (H) 1.7 - 7.7 K/uL   Lymphocytes Relative 20 %   Lymphs Abs 2.7 0.7 - 4.0 K/uL   Monocytes Relative 10 %   Monocytes Absolute 1.3 (H) 0.1 - 1.0 K/uL   Eosinophils Relative 1 %   Eosinophils Absolute 0.2 0.0 - 0.7 K/uL   Basophils Relative 0 %    Basophils Absolute 0.0 0.0 - 0.1 K/uL   No results found.  Review of Systems  Constitutional: Positive for diaphoresis, fever and malaise/fatigue. Negative for chills.  HENT: Negative for sore throat.   Eyes: Negative.   Respiratory: Negative.   Cardiovascular: Negative.   Gastrointestinal: Negative.   Genitourinary: Negative.   Musculoskeletal: Positive for back pain, joint pain and myalgias.  Skin: Negative.   Neurological: Positive for headaches.  Endo/Heme/Allergies: Negative.   Psychiatric/Behavioral: Negative.     Blood pressure 109/70, pulse 66, temperature 99 F (37.2 C), temperature source Oral, resp. rate 11, height _0  (1.626 m), weight 77.1 kg (170 lb), last menstrual period 06/11/2016, SpO2 99 %. Physical Exam  Constitutional: She is oriented to person, place, and time. She appears well-developed and well-nourished.  HENT:  Head: Normocephalic and atraumatic.  Right Ear: External ear normal.  Right jaw swollen  Eyes: Conjunctivae are normal. Pupils are equal, round, and reactive to light.  Neck: Normal range of motion. Neck supple.  Cardiovascular: Normal rate, regular rhythm and normal heart sounds.   Respiratory: Effort normal and breath sounds normal.  GI: Soft. Bowel sounds are normal.  Musculoskeletal: Normal range of motion.  Neurological: She is alert and oriented to person, place, and time.  Skin: Skin is warm and dry.     Assessment/Plan A 25 year old female admitted with dental abscesses and sickle cell painful crisis.  #1 dental abscess: Patient will be admitted for IV antibiotics and pain management. She will need consult with dental surgery. The dental abscess may have triggered her sickle cell crisis at this point. We will treat that as well. Await culture and sensitivities from blood.  #2 sickle cell painful crisis:  Patient has trigger of her sickle cell crisis. She will be initiated on PCA with Dilaudid and Toradol on IV fluids.  We will  consider physician assistant dose and if not getting better. She is relatively opiate tolerant.  #3 Anemia of chronic disease: We will follow H&H closely. At the moment she is at Baseline.  #4 Leukocytosis, most likely due to vaso-occlusive crisis  as well as dental abscess. Continue treatment and monitoring.  #5 dehydration: We will aggressively hydrate patient.  Barbette Merino, MD 06/14/2016, 2:31 PM

## 2016-06-15 MED ORDER — HYDROMORPHONE HCL 1 MG/ML IJ SOLN
1.0000 mg | INTRAMUSCULAR | Status: DC | PRN
  Administered 2016-06-15 – 2016-06-18 (×13): 1 mg via INTRAVENOUS
  Filled 2016-06-15 (×12): qty 1

## 2016-06-15 NOTE — Progress Notes (Signed)
Patient ID: Carmen [name], female   DOB: 09-12-1991, 25 y.o.   MRN: [id number]   Subjective:  Carmen [name] is a 25 years old female with history of sickle cell disease admitted for sickle cell pain crisis and dental abscess. Patient has a right-sided lower dental abscess that started around a week ago. Swelling is thesame since admission. No fevers. Some pain with swallowing but no difficulty swallowing. No difficulty breathing. She has a bad tooth there and is awaiting an outpatient dental appointment, she will know by tomorrow. Her pain is not controlled yet, still rated at 8/10.   Objective:  Vital signs in last 24 hours:  Vitals:   06/15/16 0842 06/15/16 1200 06/15/16 1227 06/15/16 1400  BP: 106/72  126/77 122/75  Pulse: 65  61 63  Resp: 16 14 16 17   Temp: 98.4 F (36.9 C)  98.5 F (36.9 C) 98.6 F (37 C)  TempSrc: Oral  Oral Oral  SpO2: 100% 97% 97% 100%  Weight:      Height:       Intake/Output from previous day:  Intake/Output Summary (Last 24 hours) at 06/15/16 1543 Last data filed at 06/15/16 [id number]  Gross per 24 hour  Intake          2687.92 ml  Output             1800 ml  Net           887.92 ml    Physical Exam: General: Alert, awake, oriented x3, in no acute distress.  HEENT: [place]/AT PEERL, EOMI Neck: Trachea midline,  no masses, no thyromegal,y no JVD, no carotid bruit OROPHARYNX:  Moist, No exudate/ erythema/lesions.  Heart: Regular rate and rhythm, without murmurs, rubs, gallops, PMI non-displaced, no heaves or thrills on palpation.  Lungs: Clear to auscultation, no wheezing or rhonchi noted. No increased vocal fremitus resonant to percussion  Abdomen: Soft, nontender, nondistended, positive bowel sounds, no masses no hepatosplenomegaly noted..  Neuro: No focal neurological deficits noted cranial nerves II through XII grossly intact. DTRs 2+ bilaterally upper and lower extremities. Strength 5 out of 5 in bilateral upper and lower extremities. Musculoskeletal: No  warm swelling or erythema around joints, no spinal tenderness noted. Psychiatric: Patient alert and oriented x3, good insight and cognition, good recent to remote recall. Lymph node survey: No cervical axillary or inguinal lymphadenopathy noted.  Lab Results:  Basic Metabolic Panel:    Component Value Date/Time   NA 137 06/14/2016 1148   K 3.6 06/14/2016 1148   CL 105 06/14/2016 1148   CO2 25 06/14/2016 1148   BUN 8 06/14/2016 1148   CREATININE 0.61 06/14/2016 1148   GLUCOSE 90 06/14/2016 1148   CALCIUM 9.5 06/14/2016 1148   CBC:    Component Value Date/Time   WBC 13.5 (H) 06/14/2016 1148   HGB 9.1 (L) 06/14/2016 1148   HCT 26.2 (L) 06/14/2016 1148   PLT 626 (H) 06/14/2016 1148   MCV 83.2 06/14/2016 1148   NEUTROABS 9.3 (H) 06/14/2016 1148   LYMPHSABS 2.7 06/14/2016 1148   MONOABS 1.3 (H) 06/14/2016 1148   EOSABS 0.2 06/14/2016 1148   BASOSABS 0.0 06/14/2016 1148    Recent Results (from the past 240 hour(s))  Culture, blood (routine x 2)     Status: None (Preliminary result)   Collection Time: 06/14/16  5:20 PM  Result Value Ref Range Status   Specimen Description BLOOD RIGHT ARM  Final   Special Requests IN PEDIATRIC BOTTLE 3CC  Final  Culture   Final    NO GROWTH < 24 HOURS Performed at [facility]    Report Status PENDING  Incomplete  Culture, blood (routine x 2)     Status: None (Preliminary result)   Collection Time: 06/14/16  5:32 PM  Result Value Ref Range Status   Specimen Description BLOOD LEFT HAND  Final   Special Requests IN PEDIATRIC BOTTLE 3.5CC  Final   Culture   Final    NO GROWTH < 24 HOURS Performed at [facility]    Report Status PENDING  Incomplete    Studies/Results: No results found.  Medications: Scheduled Meds: . clindamycin (CLEOCIN) IV  300 mg Intravenous Q6H  . enoxaparin (LOVENOX) injection  40 mg Subcutaneous Q24H  . HYDROmorphone   Intravenous Q4H  . ketorolac  30 mg Intravenous Q6H  . morphine  60 mg Oral  Q12H  . senna-docusate  1 tablet Oral BID   Continuous Infusions: . dextrose 5 % and 0.45% NaCl 125 mL/hr at 06/15/16 1129   PRN Meds:.diphenhydrAMINE **OR** diphenhydrAMINE (BENADRYL) IVPB(SICKLE CELL ONLY), HYDROmorphone (DILAUDID) injection, naloxone **AND** sodium chloride flush, ondansetron (ZOFRAN) IV, polyethylene glycol  Consultants:  None  Procedures:  None  Antibiotics:  IV Clindamycin  Assessment/Plan: Principal Problem:   Abscess, dental Active Problems:   Leukocytosis   Sickle cell crisis (HCC)   Hb-SS disease with crisis ([place])  1. Dental Abscess: Patient is on IV antibiotics and pain management. I called the Oral Surgeon on call, Dr. Benson Norway, was told he only sees patient in the clinic and not on admission. Will Continue current management and arrange for outpatient dental care. Patient is awaiting an appointment, she will know by tomorrow.   The dental abscess may have triggered her sickle cell crisis. Treat per sickle cell crisis protocol.  2. Sickle Cell Painful Crisis: Pain is not controlled with the current regimen, will add physician assistance doses. Will continue on PCA with Dilaudid and Toradol, continue IV fluids.   3. Anemia of Chronic Disease: We will follow H&H closely. At the moment she is at Baseline.  4. Leukocytosis: Most likely due to vaso-occlusive crisis  as well as dental abscess. Continue antibiotic and monitoring.  5. Dehydration: Improving, continue hydration  Code Status: Full Code Family Communication: N/A Disposition Plan: Not yet ready for discharge  Carmen [name]  If 7PM-7AM, please contact night-coverage.  06/15/2016, 3:43 PM  LOS: 1 day

## 2016-06-16 LAB — URINALYSIS, ROUTINE W REFLEX MICROSCOPIC
BILIRUBIN URINE: NEGATIVE
GLUCOSE, UA: NEGATIVE mg/dL
HGB URINE DIPSTICK: NEGATIVE
Ketones, ur: NEGATIVE mg/dL
Leukocytes, UA: NEGATIVE
Nitrite: NEGATIVE
PROTEIN: NEGATIVE mg/dL
SPECIFIC GRAVITY, URINE: 1.006 (ref 1.005–1.030)
pH: 7 (ref 5.0–8.0)

## 2016-06-16 NOTE — Progress Notes (Signed)
Pt requested that AM administration of morphine PO be given at a later time so that her "breakfast could digest". Morphine rescheduled to be given with 1200 medication regimen. Pt agreed that this time would be suitable for her. Pt did take 1000 senna and PCA is being used to adequately control her pain.

## 2016-06-16 NOTE — Progress Notes (Signed)
Patient ID: Carmen [name], female   DOB: April 04, 1991, 25 y.o.   MRN: [id number] Subjective:   Carmen [name] is a 25 years old female with history of sickle cell disease admitted for sickle cell pain crisis and dental abscess. Patient feels much better today, pain is now at a 7/10, swelling of right jaw has gone down significantly, no fever, able to tolerate PO intake better today. Had bowel movement this morning. She feels some irritation when she urinates, wants it checked out. No outright dysuria, denies vaginal discharge.  Objective:  Vital signs in last 24 hours:  Vitals:   06/16/16 0959 06/16/16 1200 06/16/16 1431 06/16/16 1600  BP: 118/74  110/79   Pulse: 66  63   Resp: 14 14 15 14   Temp:   98.7 F (37.1 C)   TempSrc:   Oral   SpO2: 100% 99% 97% 98%  Weight:      Height:        Intake/Output from previous day:   Intake/Output Summary (Last 24 hours) at 06/16/16 1615 Last data filed at 06/16/16 1500  Gross per 24 hour  Intake          4185.83 ml  Output             3300 ml  Net           885.83 ml    Physical Exam: General: Alert, awake, oriented x3, in no acute distress. Right jaw swelling much improved. HEENT: [place]/AT PEERL, EOMI Neck: Trachea midline,  no masses, no thyromegal,y no JVD, no carotid bruit OROPHARYNX:  Moist, No exudate/ erythema/lesions.  Heart: Regular rate and rhythm, without murmurs, rubs, gallops, PMI non-displaced, no heaves or thrills on palpation.  Lungs: Clear to auscultation, no wheezing or rhonchi noted. No increased vocal fremitus resonant to percussion  Abdomen: Soft, nontender, nondistended, positive bowel sounds, no masses no hepatosplenomegaly noted..  Neuro: No focal neurological deficits noted cranial nerves II through XII grossly intact. DTRs 2+ bilaterally upper and lower extremities. Strength 5 out of 5 in bilateral upper and lower extremities. Musculoskeletal: No warm swelling or erythema around joints, no spinal tenderness  noted. Psychiatric: Patient alert and oriented x3, good insight and cognition, good recent to remote recall. Lymph node survey: No cervical axillary or inguinal lymphadenopathy noted.  Lab Results:  Basic Metabolic Panel:    Component Value Date/Time   NA 137 06/14/2016 1148   K 3.6 06/14/2016 1148   CL 105 06/14/2016 1148   CO2 25 06/14/2016 1148   BUN 8 06/14/2016 1148   CREATININE 0.61 06/14/2016 1148   GLUCOSE 90 06/14/2016 1148   CALCIUM 9.5 06/14/2016 1148   CBC:    Component Value Date/Time   WBC 13.5 (H) 06/14/2016 1148   HGB 9.1 (L) 06/14/2016 1148   HCT 26.2 (L) 06/14/2016 1148   PLT 626 (H) 06/14/2016 1148   MCV 83.2 06/14/2016 1148   NEUTROABS 9.3 (H) 06/14/2016 1148   LYMPHSABS 2.7 06/14/2016 1148   MONOABS 1.3 (H) 06/14/2016 1148   EOSABS 0.2 06/14/2016 1148   BASOSABS 0.0 06/14/2016 1148    Recent Results (from the past 240 hour(s))  Culture, blood (routine x 2)     Status: None (Preliminary result)   Collection Time: 06/14/16  5:20 PM  Result Value Ref Range Status   Specimen Description BLOOD RIGHT ARM  Final   Special Requests IN PEDIATRIC BOTTLE 3CC  Final   Culture   Final    NO GROWTH 2  DAYS Performed at [facility]    Report Status PENDING  Incomplete  Culture, blood (routine x 2)     Status: None (Preliminary result)   Collection Time: 06/14/16  5:32 PM  Result Value Ref Range Status   Specimen Description BLOOD LEFT HAND  Final   Special Requests IN PEDIATRIC BOTTLE 3.5CC  Final   Culture   Final    NO GROWTH 2 DAYS Performed at [facility]    Report Status PENDING  Incomplete    Studies/Results: No results found.  Medications: Scheduled Meds: . clindamycin (CLEOCIN) IV  300 mg Intravenous Q6H  . enoxaparin (LOVENOX) injection  40 mg Subcutaneous Q24H  . HYDROmorphone   Intravenous Q4H  . ketorolac  30 mg Intravenous Q6H  . morphine  60 mg Oral Q12H  . senna-docusate  1 tablet Oral BID   Continuous  Infusions: . dextrose 5 % and 0.45% NaCl 125 mL/hr at 06/16/16 0414   PRN Meds:.diphenhydrAMINE **OR** diphenhydrAMINE (BENADRYL) IVPB(SICKLE CELL ONLY), HYDROmorphone (DILAUDID) injection, naloxone **AND** sodium chloride flush, ondansetron (ZOFRAN) IV, polyethylene glycol  Consultants:  None  Procedures:  None  Antibiotics:  IV Clindamycin  Assessment/Plan: Principal Problem:   Abscess, dental Active Problems:   Leukocytosis   Sickle cell crisis (HCC)   Hb-SS disease with crisis ([place])  1. Dental Abscess: Significant improvement on the swelling noticed. No trismus. Will continue IV antibiotics and pain management. Will Continue current management and arrange for outpatient dental care. Patient is awaiting an appointment being arranged by the sickle cell agency. She will find out today.  The dental abscess may have triggered her sickle cell crisis. Will continue to treat per sickle cell crisis protocol.  2. Sickle Cell Painful Crisis: Pain is better controlled with the current regimen after adding physician assistance doses. Will continue on PCA with Dilaudid and Toradol, continue IV fluids.   3. Anemia of Chronic Disease: We will follow H&H closely. At the moment she is at Baseline.  4. Leukocytosis: Most likely due to vaso-occlusive crisis as well as dental abscess. Continue antibiotic and monitoring. Will obtain a UA to rule out UTI on account of irritation on micturition.  5. Dehydration: Improving, continue hydration  Code Status: Full Code Family Communication: N/A Disposition Plan: Possible discharge in a day or 2.  Yumiko Alkins  If 7PM-7AM, please contact night-coverage.  06/16/2016, 4:15 PM  LOS: 2 days

## 2016-06-17 DIAGNOSIS — D5700 Hb-SS disease with crisis, unspecified: Secondary | ICD-10-CM

## 2016-06-17 DIAGNOSIS — D72829 Elevated white blood cell count, unspecified: Secondary | ICD-10-CM

## 2016-06-17 DIAGNOSIS — K029 Dental caries, unspecified: Secondary | ICD-10-CM

## 2016-06-17 MED ORDER — AMOXICILLIN-POT CLAVULANATE 875-125 MG PO TABS
1.0000 | ORAL_TABLET | Freq: Two times a day (BID) | ORAL | Status: DC
  Administered 2016-06-17 – 2016-06-19 (×4): 1 via ORAL
  Filled 2016-06-17 (×4): qty 1

## 2016-06-17 NOTE — Progress Notes (Signed)
SICKLE CELL SERVICE PROGRESS NOTE  Carmen [name] [id number] DOB: November 06, 1990 DOA: 06/14/2016 PCP: [name], MD  Assessment/Plan: Principal Problem:   Abscess, dental Active Problems:   Leukocytosis   Sickle cell crisis ([place])   Hb-SS disease with crisis ([place])   1. Hb SS with crisis: Pt continues to rate pain as 8/10. However she is ambulatory without need for assistance or assistive device. She has used 35.14 mg of Dilaudid with 60/57:demands/deliveries and an additional 7 mg of Dilaudid by IVP in the last 24 hours. I have discussed discharge expectations with Ms Stuard and the plan is to continue current regimen in anticipation of discharge home tomorrow.  2. Infected Dental Caries: No clinical evidence of abscess but does have dental caries which is likely infected. Pt on Day #4 of Clindamycin. Will discontinue Clindamycin and  change to Augmentin to complete a total of 7 days of antibiotics.  3.  Anemia of Chronic Disease: Hb stable. 4. Leukocytosis: Is likely related to crisis. No evidence of infection.     Code Status: Full Code Family Communication: N/A Disposition Plan:Anticipate discharge tomorrow.  Remee Charley A.  Pager [phone]. If 7PM-7AM, please contact night-coverage.  06/17/2016, 3:26 PM  LOS: 3 days   Interim History: Pt appears comfortable today. She reports that reports her pain 8/10 at present and localized to the back, chest and legs. She has minimal facial swelling. Pt reports that she has been ambulating and feels that she will be able to be discharged home tomorrow.  Consultants:  None  Procedures:  None  Antibiotics:  None   Objective: Vitals:   06/17/16 1023 06/17/16 1029 06/17/16 1031 06/17/16 1128  BP: 129/87     Pulse: 67     Resp: 16 17 17 15   Temp:      TempSrc:      SpO2: 100% 100% 100% 100%  Weight:      Height:       Weight change:   Intake/Output Summary (Last 24 hours) at 06/17/16 1526 Last data filed at  06/17/16 1132  Gross per 24 hour  Intake           2757.5 ml  Output             2850 ml  Net            -92.5 ml    General: Alert, awake, oriented x3, in no apparent distress.  HEENT: [place]/AT PEERL, EOMI, anicteric. Facial swelling essentially resolved.  Neck: Trachea midline,  no masses, no thyromegal,y no JVD, no carotid bruit OROPHARYNX:  Moist, No exudate/ erythema/lesions.  Heart: Regular rate and rhythm, without murmurs, rubs, gallops, PMI non-displaced, no heaves or thrills on palpation.  Lungs: Clear to auscultation, no wheezing or rhonchi noted. No increased vocal fremitus resonant to percussion. Abdomen: Soft, nontender, nondistended, positive bowel sounds, no masses no hepatosplenomegaly noted.  Neuro: No focal neurological deficits noted cranial nerves II through XII grossly intact.  Strength at functional baseline in bilateral upper and lower extremities. Musculoskeletal: No warmth swelling or erythema around joints, no spinal tenderness noted.    Data Reviewed: Basic Metabolic Panel:  Recent Labs Lab 06/14/16 1148  NA 137  K 3.6  CL 105  CO2 25  GLUCOSE 90  BUN 8  CREATININE 0.61  CALCIUM 9.5   Liver Function Tests:  Recent Labs Lab 06/14/16 1148  AST 20  ALT 12*  ALKPHOS 62  BILITOT 2.2*  PROT 8.5*  ALBUMIN 4.7   No  results for input(s): LIPASE, AMYLASE in the last 168 hours. No results for input(s): AMMONIA in the last 168 hours. CBC:  Recent Labs Lab 06/14/16 1148  WBC 13.5*  NEUTROABS 9.3*  HGB 9.1*  HCT 26.2*  MCV 83.2  PLT 626*   Cardiac Enzymes: No results for input(s): CKTOTAL, CKMB, CKMBINDEX, TROPONINI in the last 168 hours. BNP (last 3 results) No results for input(s): BNP in the last 8760 hours.  ProBNP (last 3 results) No results for input(s): PROBNP in the last 8760 hours.  CBG: No results for input(s): GLUCAP in the last 168 hours.  Recent Results (from the past 240 hour(s))  Culture, blood (routine x 2)     Status:  None (Preliminary result)   Collection Time: 06/14/16  5:20 PM  Result Value Ref Range Status   Specimen Description BLOOD RIGHT ARM  Final   Special Requests IN PEDIATRIC BOTTLE 3CC  Final   Culture   Final    NO GROWTH 3 DAYS Performed at [facility]    Report Status PENDING  Incomplete  Culture, blood (routine x 2)     Status: None (Preliminary result)   Collection Time: 06/14/16  5:32 PM  Result Value Ref Range Status   Specimen Description BLOOD LEFT HAND  Final   Special Requests IN PEDIATRIC BOTTLE 3.5CC  Final   Culture   Final    NO GROWTH 3 DAYS Performed at [facility]    Report Status PENDING  Incomplete     Studies: Dg Chest 2 View  Result Date: 05/26/2016 CLINICAL DATA:  Back pain for greater than 1 week. Reason sickle cell crisis. Recurrent severe back pain. EXAM: CHEST  2 VIEW COMPARISON:  Two-view chest x-ray 05/14/2016 FINDINGS: The heart size and mediastinal contours are within normal limits. Both lungs are clear. The visualized skeletal structures are unremarkable. IMPRESSION: Negative two view chest x-ray Electronically Signed   By: San Morelle M.D.   On: 05/26/2016 19:33   Scheduled Meds: . amoxicillin-clavulanate  1 tablet Oral Q12H  . enoxaparin (LOVENOX) injection  40 mg Subcutaneous Q24H  . HYDROmorphone   Intravenous Q4H  . ketorolac  30 mg Intravenous Q6H  . morphine  60 mg Oral Q12H  . senna-docusate  1 tablet Oral BID   Continuous Infusions: . dextrose 5 % and 0.45% NaCl 125 mL/hr at 06/17/16 1015    In excess of  25 minutes spent during this visit. Greater than 50% of time was spent in face to face contact and assessment, counseling and coordination of care.

## 2016-06-18 MED ORDER — OXYCODONE HCL 5 MG PO TABS
10.0000 mg | ORAL_TABLET | ORAL | Status: DC
  Administered 2016-06-18 – 2016-06-19 (×7): 10 mg via ORAL
  Filled 2016-06-18 (×7): qty 2

## 2016-06-18 MED ORDER — AMOXICILLIN-POT CLAVULANATE 875-125 MG PO TABS: 1.0000 | ORAL_TABLET | Freq: Two times a day (BID) | ORAL | 0 refills | Status: DC

## 2016-06-18 MED ORDER — ZOLPIDEM TARTRATE 5 MG PO TABS
5.0000 mg | ORAL_TABLET | Freq: Every evening | ORAL | Status: DC | PRN
  Administered 2016-06-18: 5 mg via ORAL
  Filled 2016-06-18: qty 1

## 2016-06-18 NOTE — Progress Notes (Signed)
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 Hb SS with crisis: Pt continues to rate pain as 8/10. However she is ambulatory without need for assistance or assistive device. She has used 45.53 mg of Dilaudid with 77:75:demands/deliveries and an additional 5 mg of Dilaudid by IVP in the last 24 hours. I will transition to oral Oxycodone and discontinue the clinican assisted dose. I have discussed discharge expectations with Carmen [name] and the plan is for discharge home tomorrow.  2. Infected Dental Caries: Pt is afebrile and able to eat without difficulty. The swelling in her jaw is unappreciable and from an infection standpoint doe not require further hospitalization. Continue Augmentin for a total of 7 days of antibiotics as put patient. :t has been given information for the free dental clinic in [facility].  3.  Anemia of Chronic Disease: Hb stable. 4. Leukocytosis: Will repeat WBC tomorrow.    Code Status: Full Code Family Communication: N/A Disposition Plan:Discharge planned for tomorrow.  Carmen [name] A.  Pager [phone]. If 7PM-7AM, please contact night-coverage.  06/18/2016, 12:07 PM  LOS: 4 days   Interim History: Pt appears comfortable today. She reports that reports her pain 8/10 at present and localized to the back. She has minimal facial swelling. Discussion yesterday was that she would be discharged home today to which patient was agreeable. Today she is refusing discharge and is argumentative stating that she needs to stay in the hospital because of her infection. I have explained to patient that from the standpoint of her infection she no longer requires hospitalization. However as is common with this patient she insists on substituting her  mis-information on disease progression for established standard of care. Nevertheless, there is an agreed upon plan to transition to her oral analgesics and discharge home tomorrow as her medical risk has been mitigated and she has no significant impairment in function.   Consultants:  None  Procedures:  None  Antibiotics:  None   Objective: Vitals:   06/18/16 0045 06/18/16 0221 06/18/16 0400 06/18/16 0542  BP: 115/83   110/70  Pulse: 66   65  Resp: 12 16 16 11   Temp: 98.5 F (36.9 C)   98.5 F (36.9 C)  TempSrc: Oral   Oral  SpO2: 98% 98% 98% 100%  Weight:      Height:       Weight change:   Intake/Output Summary (Last 24 hours) at 06/18/16 1207 Last data filed at 06/18/16 1012  Gross per 24 hour  Intake           3347.5 ml  Output             1000 ml  Net           2347.5 ml    General: Alert, awake, oriented x3, in no apparent distress.  HEENT: Talty/AT PEERL, EOMI, anicteric. Facial swelling unappreciable.  Neck: Trachea midline,  no masses, no thyromegal,y no JVD, no carotid bruit OROPHARYNX:  Moist, No exudate/ erythema/lesions. No dental tenderness with tapping on teeth. Poor dentition. Heart: Regular rate and rhythm, without murmurs, rubs, gallops, PMI non-displaced, no heaves or thrills on palpation.  Lungs: Clear to auscultation, no wheezing or rhonchi noted. No increased vocal fremitus resonant to percussion. Abdomen: Soft, nontender, nondistended, positive bowel  sounds, no masses no hepatosplenomegaly noted.  Neuro: No focal neurological deficits noted cranial nerves II through XII grossly intact.  Strength at functional baseline in bilateral upper and lower extremities. Musculoskeletal: No warmth swelling or erythema around joints, no spinal tenderness noted.    Data Reviewed: Basic Metabolic Panel:  Recent Labs Lab 06/14/16 1148  NA 137  K 3.6  CL 105  CO2 25  GLUCOSE 90  BUN 8  CREATININE 0.61  CALCIUM 9.5   Liver Function Tests:  Recent  Labs Lab 06/14/16 1148  AST 20  ALT 12*  ALKPHOS 62  BILITOT 2.2*  PROT 8.5*  ALBUMIN 4.7   No results for input(s): LIPASE, AMYLASE in the last 168 hours. No results for input(s): AMMONIA in the last 168 hours. CBC:  Recent Labs Lab 06/14/16 1148  WBC 13.5*  NEUTROABS 9.3*  HGB 9.1*  HCT 26.2*  MCV 83.2  PLT 626*   Cardiac Enzymes: No results for input(s): CKTOTAL, CKMB, CKMBINDEX, TROPONINI in the last 168 hours. BNP (last 3 results) No results for input(s): BNP in the last 8760 hours.  ProBNP (last 3 results) No results for input(s): PROBNP in the last 8760 hours.  CBG: No results for input(s): GLUCAP in the last 168 hours.  Recent Results (from the past 240 hour(s))  Culture, blood (routine x 2)     Status: None (Preliminary result)   Collection Time: 06/14/16  5:20 PM  Result Value Ref Range Status   Specimen Description BLOOD RIGHT ARM  Final   Special Requests IN PEDIATRIC BOTTLE 3CC  Final   Culture   Final    NO GROWTH 3 DAYS Performed at [facility]    Report Status PENDING  Incomplete  Culture, blood (routine x 2)     Status: None (Preliminary result)   Collection Time: 06/14/16  5:32 PM  Result Value Ref Range Status   Specimen Description BLOOD LEFT HAND  Final   Special Requests IN PEDIATRIC BOTTLE 3.5CC  Final   Culture   Final    NO GROWTH 3 DAYS Performed at [facility]    Report Status PENDING  Incomplete     Studies: Dg Chest 2 View  Result Date: 05/26/2016 CLINICAL DATA:  Back pain for greater than 1 week. Reason sickle cell crisis. Recurrent severe back pain. EXAM: CHEST  2 VIEW COMPARISON:  Two-view chest x-ray 05/14/2016 FINDINGS: The heart size and mediastinal contours are within normal limits. Both lungs are clear. The visualized skeletal structures are unremarkable. IMPRESSION: Negative two view chest x-ray Electronically Signed   By: San Morelle M.D.   On: 05/26/2016 19:33   Scheduled Meds: .  amoxicillin-clavulanate  1 tablet Oral Q12H  . enoxaparin (LOVENOX) injection  40 mg Subcutaneous Q24H  . HYDROmorphone   Intravenous Q4H  . ketorolac  30 mg Intravenous Q6H  . morphine  60 mg Oral Q12H  . oxyCODONE  10 mg Oral Q4H  . senna-docusate  1 tablet Oral BID   Continuous Infusions: . dextrose 5 % and 0.45% NaCl 125 mL/hr at 06/18/16 1023    In excess of  30 minutes spent during this visit. Greater than 50% of time was spent in face to face contact and assessment, counseling and coordination of care.

## 2016-06-18 NOTE — Progress Notes (Signed)
Date: June 18, 2016/Rhonda Eldridge Dace, BSN, [place]   [phone]  [facility] care given to patient.

## 2016-06-19 LAB — CBC WITH DIFFERENTIAL/PLATELET
BASOS PCT: 0 %
Basophils Absolute: 0 10*3/uL (ref 0.0–0.1)
EOS ABS: 0.3 10*3/uL (ref 0.0–0.7)
Eosinophils Relative: 2 %
HCT: 25.6 % — ABNORMAL LOW (ref 36.0–46.0)
HEMOGLOBIN: 8.9 g/dL — AB (ref 12.0–15.0)
Lymphocytes Relative: 40 %
Lymphs Abs: 4.3 10*3/uL — ABNORMAL HIGH (ref 0.7–4.0)
MCH: 28.7 pg (ref 26.0–34.0)
MCHC: 34.8 g/dL (ref 30.0–36.0)
MCV: 82.6 fL (ref 78.0–100.0)
Monocytes Absolute: 1.7 10*3/uL — ABNORMAL HIGH (ref 0.1–1.0)
Monocytes Relative: 16 %
NEUTROS PCT: 42 %
Neutro Abs: 4.5 10*3/uL (ref 1.7–7.7)
Platelets: 457 10*3/uL — ABNORMAL HIGH (ref 150–400)
RBC: 3.1 MIL/uL — AB (ref 3.87–5.11)
RDW: 17.8 % — ABNORMAL HIGH (ref 11.5–15.5)
WBC: 10.8 10*3/uL — AB (ref 4.0–10.5)

## 2016-06-19 LAB — CULTURE, BLOOD (ROUTINE X 2)
CULTURE: NO GROWTH
Culture: NO GROWTH

## 2016-06-19 NOTE — Discharge Summary (Signed)
Physician Discharge Summary  [name] [id number] DOB: [birth year] DOA: 06/14/2016  PCP: [name], MD  Admit date: 06/14/2016  Discharge date: 06/19/2016  Discharge Diagnoses:  Principal Problem:   Abscess, dental Active Problems:   Leukocytosis   Sickle cell crisis ([place])   Hb-SS disease with crisis [facility])   Discharge Condition: Stable  Disposition:  Follow-up Information    [name], MD Follow up in 3 day(s).   Specialty:  Family Medicine Contact information: [address] [place] [address] [phone]           Diet: Regular  Wt Readings from Last 3 Encounters:  06/14/16 170 lb (77.1 kg)  05/26/16 175 lb (79.4 kg)  05/14/16 165 lb 4.8 oz (75 kg)    History of present illness:  This is a 25 year old female well known to our service who presented to the emergency room with right lower dental pain and swelling. She has dental abscess  On the same side she had a similar infection weeks ago. Associated with that she had lower back pain and lower extremity pain. The pain is rated as 9 out of 10  Consistent with her previous sickle cell crisis. She denied any nausea or vomiting no diarrhea. She has taken her home medications without any relief.  Patient has some fever at home with mild chills but is afebrile here.  Hospital Course:  She was admitted and started on IV clindamycin for her dental infection, her sickle cell pain crisis was managed appropriately based on management protocol. Patient's symptoms improved, swelling was down, fever resolved, pain improved, she was transitioned to oral antibiotics, Augmentin and oral pain medications. She tolerated them well, ambulating well without restrictions. She was discharged home in a hemodynamically stable condition to follow up with out patient dentist for extraction, and PCP for ongoing care. She was prescribed oral Augmentin to complete the antibiotic course for a total of 7 days.    Discharge Exam: Vitals:   06/19/16 0607 06/19/16 0800  BP: 121/81   Pulse: 80   Resp: 17 17  Temp: 98.7 F (37.1 C)    Vitals:   06/19/16 0000 06/19/16 0400 06/19/16 0607 06/19/16 0800  BP:   121/81   Pulse:   80   Resp: 18 18 17 17   Temp:   98.7 F (37.1 C)   TempSrc:   Oral   SpO2: 98% 98% 99% 98%  Weight:      Height:        General appearance : Awake, alert, not in any distress. Speech Clear. Not toxic looking HEENT: Atraumatic and Normocephalic, pupils equally reactive to light and accomodation Neck: Supple, no JVD. No cervical lymphadenopathy.  Chest: Good air entry bilaterally, no added sounds  CVS: S1 S2 regular, no murmurs.  Abdomen: Bowel sounds present, Non tender and not distended with no gaurding, rigidity or rebound. Extremities: B/L Lower Ext shows no edema, both legs are warm to touch Neurology: Awake alert, and oriented X 3, CN II-XII intact, Non focal Skin: No Rash  Discharge Instructions  Discharge Instructions    Diet - low sodium heart healthy    Complete by:  As directed   Increase activity slowly    Complete by:  As directed       Medication List    TAKE these medications   amoxicillin-clavulanate 875-125 MG tablet Commonly known as:  AUGMENTIN Take 1 tablet by mouth every 12 (twelve) hours.   cyclobenzaprine 10 MG tablet  Commonly known as:  FLEXERIL Take 1 tablet (10 mg total) by mouth 2 (two) times daily as needed for muscle spasms.   HYDROcodone-acetaminophen 5-325 MG tablet Commonly known as:  NORCO/VICODIN Take 2 tablets by mouth every 4 (four) hours as needed.   ibuprofen 800 MG tablet Commonly known as:  ADVIL,MOTRIN Take 1 tablet (800 mg total) by mouth 3 (three) times daily. What changed:  when to take this  reasons to take this   morphine 60 MG 12 hr tablet Commonly known as:  MS CONTIN Take 1 tablet (60 mg total) by mouth every 12 (twelve) hours.   oxyCODONE-acetaminophen 10-325 MG tablet Commonly known as:   PERCOCET Take 1 tablet by mouth every 6 (six) hours as needed for pain.        The results of significant diagnostics from this hospitalization (including imaging, microbiology, ancillary and laboratory) are listed below for reference.    Significant Diagnostic Studies: Dg Chest 2 View  Result Date: 05/26/2016 CLINICAL DATA:  Back pain for greater than 1 week. Reason sickle cell crisis. Recurrent severe back pain. EXAM: CHEST  2 VIEW COMPARISON:  Two-view chest x-ray 05/14/2016 FINDINGS: The heart size and mediastinal contours are within normal limits. Both lungs are clear. The visualized skeletal structures are unremarkable. IMPRESSION: Negative two view chest x-ray Electronically Signed   By: San Morelle M.D.   On: 05/26/2016 19:33   Microbiology: Recent Results (from the past 240 hour(s))  Culture, blood (routine x 2)     Status: None (Preliminary result)   Collection Time: 06/14/16  5:20 PM  Result Value Ref Range Status   Specimen Description BLOOD RIGHT ARM  Final   Special Requests IN PEDIATRIC BOTTLE 3CC  Final   Culture   Final    NO GROWTH 4 DAYS Performed at [facility]    Report Status PENDING  Incomplete  Culture, blood (routine x 2)     Status: None (Preliminary result)   Collection Time: 06/14/16  5:32 PM  Result Value Ref Range Status   Specimen Description BLOOD LEFT HAND  Final   Special Requests IN PEDIATRIC BOTTLE 3.5CC  Final   Culture   Final    NO GROWTH 4 DAYS Performed at [facility]    Report Status PENDING  Incomplete     Labs: Basic Metabolic Panel:  Recent Labs Lab 06/14/16 1148  NA 137  K 3.6  CL 105  CO2 25  GLUCOSE 90  BUN 8  CREATININE 0.61  CALCIUM 9.5   Liver Function Tests:  Recent Labs Lab 06/14/16 1148  AST 20  ALT 12*  ALKPHOS 62  BILITOT 2.2*  PROT 8.5*  ALBUMIN 4.7   No results for input(s): LIPASE, AMYLASE in the last 168 hours. No results for input(s): AMMONIA in the last 168  hours. CBC:  Recent Labs Lab 06/14/16 1148 06/19/16 0617  WBC 13.5* 10.8*  NEUTROABS 9.3* 4.5  HGB 9.1* 8.9*  HCT 26.2* 25.6*  MCV 83.2 82.6  PLT 626* 457*   Cardiac Enzymes: No results for input(s): CKTOTAL, CKMB, CKMBINDEX, TROPONINI in the last 168 hours. BNP: Invalid input(s): POCBNP CBG: No results for input(s): GLUCAP in the last 168 hours.  Time coordinating discharge: 50 minutes  Signed:  Ferne Ellingwood, [facility] 06/19/2016, 10:12 AM

## 2016-06-19 NOTE — Discharge Instructions (Signed)
Please pick up your antibiotics prescription from your pharmacy and complete the course as prescribed. Follow up with your PCP. Maintain good hydration at all times, avoid heat, cold, stress, and infection triggers.

## 2016-06-19 NOTE — Care Management Note (Signed)
Case Management Note  Patient Details  Name: Carmen [name] MRN: [id number] Date of Birth: [birth year]  Subjective/Objective:      Sickle Cell Anemia crisis              Action/Plan: Discharge Planning: AVS reviewed: NCM spoke to pt and states she will be living with a friend for now. States she will bus passes for transportation. CSW referral for bus passes. States she able to get medications. And does follow up with her PCP.  PCP   [name] MD    Expected Discharge Date:  06/19/2016            Expected Discharge Plan:  Home/Self Care  In-House Referral:  Clinical Social Work  Discharge planning Services  CM Consult  Post Acute Care Choice:  NA Choice offered to:  NA  DME Arranged:  N/A DME Agency:  NA  HH Arranged:  NA HH Agency:  NA  Status of Service:  Completed, signed off  If discussed at [place] of Stay Meetings, dates discussed:    Additional Comments:  Erenest Rasher, RN 06/19/2016, 1:36 PM

## 2016-06-19 NOTE — Progress Notes (Signed)
Transportation Issues, delivered two bus passes to pt room. Pt states she has used public transportation in the past and feels safe riding bus. Jonnie Finner RN CCM Case Mgmt phone [phone]

## 2016-06-19 NOTE — Progress Notes (Signed)
Pt ready for discharge and just waiting for bus passes.

## 2016-08-15 ENCOUNTER — Inpatient Hospital Stay (HOSPITAL_COMMUNITY)
Admission: EM | Admit: 2016-08-15 | Discharge: 2016-08-23 | DRG: 812 | Disposition: A | Discharge status: Home / Self Care | Payer: Medicaid Other | Attending: Internal Medicine | Admitting: Internal Medicine

## 2016-08-15 ENCOUNTER — Emergency Department (HOSPITAL_COMMUNITY): Payer: Medicaid Other

## 2016-08-15 ENCOUNTER — Encounter (HOSPITAL_COMMUNITY): Payer: Self-pay | Admitting: Emergency Medicine

## 2016-08-15 DIAGNOSIS — D571 Sickle-cell disease without crisis: Secondary | ICD-10-CM | POA: Diagnosis present

## 2016-08-15 DIAGNOSIS — N898 Other specified noninflammatory disorders of vagina: Secondary | ICD-10-CM | POA: Diagnosis present

## 2016-08-15 DIAGNOSIS — K5900 Constipation, unspecified: Secondary | ICD-10-CM | POA: Diagnosis present

## 2016-08-15 DIAGNOSIS — Z841 Family history of disorders of kidney and ureter: Secondary | ICD-10-CM

## 2016-08-15 DIAGNOSIS — Z79891 Long term (current) use of opiate analgesic: Secondary | ICD-10-CM

## 2016-08-15 DIAGNOSIS — B373 Candidiasis of vulva and vagina: Secondary | ICD-10-CM | POA: Diagnosis not present

## 2016-08-15 DIAGNOSIS — D638 Anemia in other chronic diseases classified elsewhere: Secondary | ICD-10-CM | POA: Diagnosis present

## 2016-08-15 DIAGNOSIS — E876 Hypokalemia: Secondary | ICD-10-CM | POA: Diagnosis present

## 2016-08-15 DIAGNOSIS — Z79899 Other long term (current) drug therapy: Secondary | ICD-10-CM

## 2016-08-15 DIAGNOSIS — Z9081 Acquired absence of spleen: Secondary | ICD-10-CM | POA: Diagnosis not present

## 2016-08-15 DIAGNOSIS — X58XXXA Exposure to other specified factors, initial encounter: Secondary | ICD-10-CM | POA: Diagnosis present

## 2016-08-15 DIAGNOSIS — Z832 Family history of diseases of the blood and blood-forming organs and certain disorders involving the immune mechanism: Secondary | ICD-10-CM

## 2016-08-15 DIAGNOSIS — G894 Chronic pain syndrome: Secondary | ICD-10-CM | POA: Diagnosis not present

## 2016-08-15 DIAGNOSIS — K5903 Drug induced constipation: Secondary | ICD-10-CM

## 2016-08-15 DIAGNOSIS — G8929 Other chronic pain: Secondary | ICD-10-CM | POA: Diagnosis present

## 2016-08-15 DIAGNOSIS — F4322 Adjustment disorder with anxiety: Secondary | ICD-10-CM | POA: Diagnosis not present

## 2016-08-15 DIAGNOSIS — D5700 Hb-SS disease with crisis, unspecified: Secondary | ICD-10-CM | POA: Diagnosis present

## 2016-08-15 DIAGNOSIS — N63 Unspecified lump in unspecified breast: Secondary | ICD-10-CM | POA: Diagnosis present

## 2016-08-15 DIAGNOSIS — Z9049 Acquired absence of other specified parts of digestive tract: Secondary | ICD-10-CM | POA: Diagnosis not present

## 2016-08-15 DIAGNOSIS — Z8249 Family history of ischemic heart disease and other diseases of the circulatory system: Secondary | ICD-10-CM | POA: Diagnosis not present

## 2016-08-15 DIAGNOSIS — E871 Hypo-osmolality and hyponatremia: Secondary | ICD-10-CM | POA: Diagnosis present

## 2016-08-15 DIAGNOSIS — L298 Other pruritus: Secondary | ICD-10-CM | POA: Diagnosis not present

## 2016-08-15 DIAGNOSIS — T148XXA Other injury of unspecified body region, initial encounter: Secondary | ICD-10-CM | POA: Diagnosis present

## 2016-08-15 DIAGNOSIS — Z8261 Family history of arthritis: Secondary | ICD-10-CM

## 2016-08-15 LAB — CBC WITH DIFFERENTIAL/PLATELET
Basophils Absolute: 0 10*3/uL (ref 0.0–0.1)
Basophils Relative: 0 %
EOS PCT: 1 %
Eosinophils Absolute: 0.1 10*3/uL (ref 0.0–0.7)
HEMATOCRIT: 26.1 % — AB (ref 36.0–46.0)
Hemoglobin: 9.4 g/dL — ABNORMAL LOW (ref 12.0–15.0)
LYMPHS PCT: 37 %
Lymphs Abs: 3.7 10*3/uL (ref 0.7–4.0)
MCH: 29.2 pg (ref 26.0–34.0)
MCHC: 36 g/dL (ref 30.0–36.0)
MCV: 81.1 fL (ref 78.0–100.0)
MONO ABS: 1.4 10*3/uL — AB (ref 0.1–1.0)
MONOS PCT: 14 %
NEUTROS ABS: 4.8 10*3/uL (ref 1.7–7.7)
Neutrophils Relative %: 48 %
Platelets: 381 10*3/uL (ref 150–400)
RBC: 3.22 MIL/uL — ABNORMAL LOW (ref 3.87–5.11)
RDW: 15.4 % (ref 11.5–15.5)
WBC: 10 10*3/uL (ref 4.0–10.5)

## 2016-08-15 LAB — COMPREHENSIVE METABOLIC PANEL
ALBUMIN: 4.6 g/dL (ref 3.5–5.0)
ALK PHOS: 62 U/L (ref 38–126)
ALT: 70 U/L — ABNORMAL HIGH (ref 14–54)
ANION GAP: 9 (ref 5–15)
AST: 61 U/L — AB (ref 15–41)
BUN: 22 mg/dL — AB (ref 6–20)
CALCIUM: 9.5 mg/dL (ref 8.9–10.3)
CO2: 21 mmol/L — AB (ref 22–32)
Chloride: 109 mmol/L (ref 101–111)
Creatinine, Ser: 0.66 mg/dL (ref 0.44–1.00)
GFR calc Af Amer: >60 mL/min (ref >60)
GFR calc non Af Amer: >60 mL/min (ref >60)
GLUCOSE: 105 mg/dL — AB (ref 65–99)
POTASSIUM: 3.7 mmol/L (ref 3.5–5.1)
SODIUM: 139 mmol/L (ref 135–145)
Total Bilirubin: 1.8 mg/dL — ABNORMAL HIGH (ref 0.3–1.2)
Total Protein: 8.5 g/dL — ABNORMAL HIGH (ref 6.5–8.1)

## 2016-08-15 MED ORDER — HYDROMORPHONE HCL 2 MG/ML IJ SOLN: 0.0250 mg/kg | INTRAMUSCULAR | Status: AC

## 2016-08-15 MED ORDER — HYDROMORPHONE HCL 2 MG/ML IJ SOLN
2.0000 mg | Freq: Once | INTRAMUSCULAR | Status: AC
  Administered 2016-08-15: 2 mg via INTRAVENOUS
  Filled 2016-08-15: qty 1

## 2016-08-15 MED ORDER — SODIUM CHLORIDE 0.45 % IV SOLN
INTRAVENOUS | Status: DC
  Administered 2016-08-15: 21:00:00 via INTRAVENOUS

## 2016-08-15 MED ORDER — KETOROLAC TROMETHAMINE 30 MG/ML IJ SOLN
30.0000 mg | Freq: Once | INTRAMUSCULAR | Status: AC
  Administered 2016-08-15: 30 mg via INTRAVENOUS
  Filled 2016-08-15: qty 1

## 2016-08-15 MED ORDER — HYDROMORPHONE HCL 2 MG/ML IJ SOLN
0.0250 mg/kg | INTRAMUSCULAR | Status: AC
  Administered 2016-08-15: 1.8 mg via INTRAVENOUS
  Filled 2016-08-15: qty 1

## 2016-08-15 NOTE — ED Triage Notes (Signed)
Pt reports sickle cell pain in chest and back x4 days. Pt states this mimics typical sickle cell pain. Denies pain radiation. Pt also reports SOB with dizziness. Pt reports 2 episodes of emesis in the past 24 hours but denies diarrhea and abdominal pain.

## 2016-08-15 NOTE — ED Provider Notes (Signed)
[facility] DEPT Provider Note   CSN: [id number] Arrival date & time: 08/15/16  1808     History   Chief Complaint Chief Complaint  Patient presents with  . Sickle Cell Pain Crisis    HPI Carmen [name] is a 25 y.o. female with a past medical history of sickle cell anemia, acute chest presents the ED today complaining of "sickle cell pain". Patient states that for the last 4 days she has been expressing severe pain in her upper and lower back and bilateral lower extremities. Patient also reports intermittent sharp chest pains, diffusely across her chest. She also endorses shortness of breath with ambulating, cold sweats and nausea. Patient had 2 episodes of nonbloody and nonbilious emesis today. She denies any true fever, cough, lower extremity swelling. She tried taking her home Percocet and morphine without relief of her symptoms. Patient has also noticed a lump in her right breast that has been present for 2 months that recently became tender. She denies any galactorrhea, erythema over her breast. Last menstrual period was one month ago. No history of DVT or PE.   HPI  Past Medical History:  Diagnosis Date  . Abortion    05/2012  . Headache(784.0)   . Sickle cell crisis [facility])     Patient Active Problem List   Diagnosis Date Noted  . Abscess, dental 06/14/2016  . Anemia of chronic disease   . Acute URI 02/10/2016  . Sickle-cell crisis ([place]) 12/19/2015  . Sickle cell anemia with crisis ([place]) 12/19/2015  . Chest pain 11/10/2015  . Hb-SS disease with crisis ([place]) 10/17/2015  . Sickle cell disease with crisis ([place]) 09/09/2015  . Dental caries   . Pain in a tooth or teeth   . Right facial swelling   . HCAP (healthcare-associated pneumonia) 02/28/2015  . Sickle cell crisis ([place]) 02/28/2015  . Sickle cell anemia with pain ([place]) 02/16/2015  . 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 Acute calculous cholecystitis s/p lap chole 11/30/2014 11/30/2014    . Hypokalemia 11/28/2014  . Atypical chest pain 11/28/2014  . Leukocytosis 11/22/2014  . Thrombocytosis ([place]) 11/22/2014  . Anemia 05/16/2014  . Migraine, unspecified, without mention of intractable migraine without mention of status migrainosus 05/16/2014  . Sickle cell pain crisis ([place]) 11/12/2013    Past Surgical History:  Procedure Laterality Date  . CHOLECYSTECTOMY N/A 11/30/2014   Procedure: LAPAROSCOPIC CHOLECYSTECTOMY SINGLE SITE WITH INTRAOPERATIVE CHOLANGIOGRAM;  Surgeon: Michael Boston, MD;  Location: [facility] ORS;  Service: General;  Laterality: N/A;  . SPLENECTOMY      OB History    Gravida Para Term Preterm AB Living   1       1     SAB TAB Ectopic Multiple Live Births                   Home Medications    Prior to Admission medications   Medication Sig Start Date End Date Taking? Authorizing Provider  hydroxyurea (HYDREA) 500 MG capsule Take 500 mg by mouth 2 (two) times daily. 07/14/16  Yes Historical Provider, MD  ibuprofen (ADVIL,MOTRIN) 800 MG tablet Take 1 tablet (800 mg total) by mouth 3 (three) times daily. Patient taking differently: Take 800 mg by mouth 3 (three) times daily as needed for moderate pain.  03/03/16  Yes Delsa Grana, PA-C  morphine (MS CONTIN) 60 MG 12 hr tablet Take 1 tablet (60 mg total) by mouth every 12 (twelve) hours. 05/22/16  Yes [place]  Essie Christine, MD  oxyCODONE-acetaminophen (PERCOCET) 10-325 MG tablet Take 1 tablet by mouth every 6 (six) hours as needed for pain. 05/22/16  Yes Tresa Garter, MD  amoxicillin-clavulanate (AUGMENTIN) 875-125 MG tablet Take 1 tablet by mouth every 12 (twelve) hours. Patient not taking: Reported on 08/15/2016 06/18/16   Leana Gamer, MD    Family History Family History  Problem Relation Age of Onset  . Hypertension Mother   . Sickle cell anemia Sister   . Kidney disease Sister     Lupus  . Arthritis Sister   . Sickle cell anemia Sister   . Sickle cell trait Sister   . Heart disease Maternal  Aunt   . Heart disease Maternal Uncle     Social History Social History  Substance Use Topics  . Smoking status: Never Smoker  . Smokeless tobacco: Never Used  . Alcohol use No     Allergies   Review of patient's allergies indicates no known allergies.   Review of Systems Review of Systems  All other systems reviewed and are negative.    Physical Exam Updated Vital Signs BP 127/74 (BP Location: Right Arm)   Pulse 93   Temp 98.6 F (37 C) (Oral)   Resp 15   Ht 5\' 3"  (1.6 m)   Wt 72.6 kg   LMP 08/06/2016   SpO2 100%   BMI 28.34 kg/m   Physical Exam  Constitutional: She is oriented to person, place, and time. She appears well-developed and well-nourished. No distress.  HENT:  Head: Normocephalic and atraumatic.  Mouth/Throat: No oropharyngeal exudate.  Eyes: Conjunctivae and EOM are normal. Pupils are equal, round, and reactive to light. Right eye exhibits no discharge. Left eye exhibits no discharge. No scleral icterus.  Cardiovascular: Normal rate, regular rhythm, normal heart sounds and intact distal pulses.  Exam reveals no gallop and no friction rub.   No murmur heard. Pulmonary/Chest: Effort normal and breath sounds normal. No respiratory distress. She has no wheezes. She has no rales. She exhibits no tenderness.  Abdominal: Soft. She exhibits no distension. There is no tenderness. There is no guarding.  Musculoskeletal: Normal range of motion. She exhibits no edema.  Neurological: She is alert and oriented to person, place, and time.  Skin: Skin is warm and dry. No rash noted. She is not diaphoretic. No erythema. No pallor.  Psychiatric: She has a normal mood and affect. Her behavior is normal.  Nursing note and vitals reviewed.    ED Treatments / Results  Labs (all labs ordered are listed, but only abnormal results are displayed) Labs Reviewed  COMPREHENSIVE METABOLIC PANEL - Abnormal; Notable for the following:       Result Value   CO2 21 (*)     Glucose, Bld 105 (*)    BUN 22 (*)    Total Protein 8.5 (*)    AST 61 (*)    ALT 70 (*)    Total Bilirubin 1.8 (*)    All other components within normal limits  CBC WITH DIFFERENTIAL/PLATELET - Abnormal; Notable for the following:    RBC 3.22 (*)    Hemoglobin 9.4 (*)    HCT 26.1 (*)    Monocytes Absolute 1.4 (*)    All other components within normal limits    EKG  EKG Interpretation None       Radiology Dg Chest 2 View  Result Date: 08/15/2016 CLINICAL DATA:  Chest pain for 3 days. EXAM: CHEST  2 VIEW COMPARISON:  None. FINDINGS: The cardiomediastinal silhouette is unremarkable. There is no evidence of focal airspace disease, pulmonary edema, suspicious pulmonary nodule/mass, pleural effusion, or pneumothorax. No acute bony abnormalities are identified. IMPRESSION: No active cardiopulmonary disease. Electronically Signed   By: Margarette Canada M.D.   On: 08/15/2016 18:55    Procedures Procedures (including critical care time)  Medications Ordered in ED Medications  0.45 % sodium chloride infusion (not administered)  HYDROmorphone (DILAUDID) injection 1.8 mg (not administered)    Or  HYDROmorphone (DILAUDID) injection 1.8 mg (not administered)     Initial Impression / Assessment and Plan / ED Course  I have reviewed the triage vital signs and the nursing notes.  Pertinent labs & imaging results that were available during my care of the patient were reviewed by me and considered in my medical decision making (see chart for details).  Clinical Course    25 year old female with history of sickle cell anemia presents the ED today complaining of back pain and bilateral lower extremity pain. She also complaining of intermittent sharp chest pains onset 4 days ago. Patient also endorses shortness of breath with exertion. Presentation to ED patient overall appears well. All vitals are stable. No hypoxia or tachycardia. EKG without signs of ischemia. Chest x-ray shows no sign of  pneumonia. Doubt acute chest syndrome. Doubt PE. Hemoglobin stable at 9.4. Patient was given 125 mL per hour of half normal saline, 3 rounds of Dilaudid and a dose of Toradol with minimal relief of her symptoms. Patient will likely require admission for further analgesia.  Spoke with Dr. Loleta Books with hospitalist to admit patient to his service.  Final Clinical Impressions(s) / ED Diagnoses   Final diagnoses:  Sickle cell pain crisis [facility])    New Prescriptions New Prescriptions   No medications on file     Carlos Levering, PA-C 08/16/16 0100    Isla Pence, MD 08/18/16 1022
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
## 2016-08-16 DIAGNOSIS — D638 Anemia in other chronic diseases classified elsewhere: Secondary | ICD-10-CM

## 2016-08-16 DIAGNOSIS — G894 Chronic pain syndrome: Secondary | ICD-10-CM

## 2016-08-16 DIAGNOSIS — D5700 Hb-SS disease with crisis, unspecified: Principal | ICD-10-CM

## 2016-08-16 MED ORDER — PROMETHAZINE HCL 25 MG PO TABS: 12.5000 mg | ORAL_TABLET | ORAL | Status: DC | PRN

## 2016-08-16 MED ORDER — PROMETHAZINE HCL 25 MG RE SUPP
12.5000 mg | RECTAL | Status: DC | PRN
Start: 2016-08-16 — End: 2016-08-16

## 2016-08-16 MED ORDER — NALOXONE HCL 0.4 MG/ML IJ SOLN: 0.4000 mg | INTRAMUSCULAR | Status: DC | PRN

## 2016-08-16 MED ORDER — DIPHENHYDRAMINE HCL 25 MG PO CAPS
25.0000 mg | ORAL_CAPSULE | ORAL | Status: DC | PRN
  Administered 2016-08-17: 50 mg via ORAL
  Administered 2016-08-17: 25 mg via ORAL
  Administered 2016-08-18 – 2016-08-21 (×4): 50 mg via ORAL
  Administered 2016-08-22: 25 mg via ORAL
  Filled 2016-08-16: qty 1
  Filled 2016-08-16 (×2): qty 2
  Filled 2016-08-16: qty 1
  Filled 2016-08-16 (×3): qty 2

## 2016-08-16 MED ORDER — ENOXAPARIN SODIUM 40 MG/0.4ML ~~LOC~~ SOLN
40.0000 mg | SUBCUTANEOUS | Status: DC
  Filled 2016-08-16 (×6): qty 0.4

## 2016-08-16 MED ORDER — POLYETHYLENE GLYCOL 3350 17 G PO PACK: 17.0000 g | PACK | Freq: Every day | ORAL | Status: DC | PRN

## 2016-08-16 MED ORDER — HYDROMORPHONE HCL 1 MG/ML IJ SOLN
1.0000 mg | INTRAMUSCULAR | Status: DC
  Administered 2016-08-16 – 2016-08-19 (×28): 1 mg via INTRAVENOUS
  Filled 2016-08-16 (×29): qty 1

## 2016-08-16 MED ORDER — KETOROLAC TROMETHAMINE 30 MG/ML IJ SOLN
30.0000 mg | Freq: Four times a day (QID) | INTRAMUSCULAR | Status: DC
  Administered 2016-08-16 – 2016-08-18 (×9): 30 mg via INTRAVENOUS
  Filled 2016-08-16 (×10): qty 1

## 2016-08-16 MED ORDER — SODIUM CHLORIDE 0.9 % IV SOLN
25.0000 mg | INTRAVENOUS | Status: DC | PRN
  Filled 2016-08-16: qty 0.5

## 2016-08-16 MED ORDER — FOLIC ACID 1 MG PO TABS
1.0000 mg | ORAL_TABLET | Freq: Every day | ORAL | Status: DC
  Administered 2016-08-16 – 2016-08-23 (×8): 1 mg via ORAL
  Filled 2016-08-16 (×8): qty 1

## 2016-08-16 MED ORDER — HYDROXYUREA 500 MG PO CAPS
500.0000 mg | ORAL_CAPSULE | Freq: Two times a day (BID) | ORAL | Status: DC
  Filled 2016-08-16 (×8): qty 1

## 2016-08-16 MED ORDER — ONDANSETRON HCL 4 MG/2ML IJ SOLN
4.0000 mg | Freq: Four times a day (QID) | INTRAMUSCULAR | Status: DC | PRN
  Administered 2016-08-16 – 2016-08-22 (×8): 4 mg via INTRAVENOUS
  Filled 2016-08-16 (×9): qty 2

## 2016-08-16 MED ORDER — SODIUM CHLORIDE 0.45 % IV SOLN
INTRAVENOUS | Status: DC
  Administered 2016-08-17: 04:00:00 via INTRAVENOUS

## 2016-08-16 MED ORDER — SODIUM CHLORIDE 0.9% FLUSH: 9.0000 mL | INTRAVENOUS | Status: DC | PRN

## 2016-08-16 MED ORDER — SENNOSIDES-DOCUSATE SODIUM 8.6-50 MG PO TABS
1.0000 | ORAL_TABLET | Freq: Two times a day (BID) | ORAL | Status: DC
  Administered 2016-08-16 – 2016-08-23 (×15): 1 via ORAL
  Filled 2016-08-16 (×15): qty 1

## 2016-08-16 MED ORDER — MORPHINE SULFATE ER 30 MG PO TBCR
60.0000 mg | EXTENDED_RELEASE_TABLET | Freq: Two times a day (BID) | ORAL | Status: DC
  Administered 2016-08-16 – 2016-08-23 (×16): 60 mg via ORAL
  Filled 2016-08-16 (×16): qty 2

## 2016-08-16 MED ORDER — INFLUENZA VAC SPLIT QUAD 0.5 ML IM SUSY
0.5000 mL | PREFILLED_SYRINGE | INTRAMUSCULAR | Status: AC
  Administered 2016-08-17: 0.5 mL via INTRAMUSCULAR
  Filled 2016-08-16: qty 0.5

## 2016-08-16 MED ORDER — HYDROMORPHONE 1 MG/ML IV SOLN
INTRAVENOUS | Status: DC
  Administered 2016-08-16: 4.9 mg via INTRAVENOUS
  Administered 2016-08-16: 2.1 mg via INTRAVENOUS
  Administered 2016-08-16: 1 mg via INTRAVENOUS
  Administered 2016-08-17: 5.8 mg via INTRAVENOUS
  Administered 2016-08-17: 5.6 mg via INTRAVENOUS
  Administered 2016-08-17: 14:00:00 via INTRAVENOUS
  Administered 2016-08-17: 2.8 mg via INTRAVENOUS
  Administered 2016-08-17: 4.9 mg via INTRAVENOUS
  Administered 2016-08-17: 3.5 mg via INTRAVENOUS
  Administered 2016-08-17: 1.4 mg via INTRAVENOUS
  Administered 2016-08-18 (×3): 4.2 mg via INTRAVENOUS
  Administered 2016-08-18: 7.7 mg via INTRAVENOUS
  Administered 2016-08-18: 6.3 mg via INTRAVENOUS
  Administered 2016-08-18: 9.8 mg via INTRAVENOUS
  Administered 2016-08-18: 4.9 mg via INTRAVENOUS
  Administered 2016-08-19: 1.4 mg via INTRAVENOUS
  Administered 2016-08-19: 8.4 mg via INTRAVENOUS
  Administered 2016-08-19: 5.45 mg via INTRAVENOUS
  Administered 2016-08-19: 05:00:00 via INTRAVENOUS
  Administered 2016-08-19: 7 mg via INTRAVENOUS
  Administered 2016-08-19: 18:00:00 via INTRAVENOUS
  Administered 2016-08-19 (×2): 9.8 mg via INTRAVENOUS
  Administered 2016-08-20: 7.7 mg via INTRAVENOUS
  Administered 2016-08-20 (×2): 9.1 mg via INTRAVENOUS
  Administered 2016-08-20 (×2): via INTRAVENOUS
  Administered 2016-08-20: 3.41 mg via INTRAVENOUS
  Administered 2016-08-20: 10.5 mg via INTRAVENOUS
  Administered 2016-08-20: 6.7 mg via INTRAVENOUS
  Administered 2016-08-21: 7.7 mg via INTRAVENOUS
  Administered 2016-08-21: 5.6 mg via INTRAVENOUS
  Administered 2016-08-21: 4.2 mg via INTRAVENOUS
  Administered 2016-08-21: 11:00:00 via INTRAVENOUS
  Administered 2016-08-21: 4.2 mg via INTRAVENOUS
  Administered 2016-08-21: 9.8 mg via INTRAVENOUS
  Administered 2016-08-21: 4.2 mg via INTRAVENOUS
  Administered 2016-08-22: 3.5 mg via INTRAVENOUS
  Administered 2016-08-22: 9.1 mg via INTRAVENOUS
  Administered 2016-08-22: 11.9 mg via INTRAVENOUS
  Administered 2016-08-22: 1.4 mg via INTRAVENOUS
  Administered 2016-08-22: 4.9 mg via INTRAVENOUS
  Administered 2016-08-22: 17:00:00 via INTRAVENOUS
  Administered 2016-08-22: 16.8 mg via INTRAVENOUS
  Administered 2016-08-22: 4.9 mg via INTRAVENOUS
  Administered 2016-08-22: 04:00:00 via INTRAVENOUS
  Administered 2016-08-23: 13.3 mg via INTRAVENOUS
  Administered 2016-08-23: 11.4 mg via INTRAVENOUS
  Administered 2016-08-23: 12:00:00 via INTRAVENOUS
  Administered 2016-08-23: 8.4 mg via INTRAVENOUS
  Administered 2016-08-23: 9.1 mg via INTRAVENOUS
  Administered 2016-08-23: 02:00:00 via INTRAVENOUS
  Administered 2016-08-23: 0.7 mg via INTRAVENOUS
  Filled 2016-08-16 (×12): qty 25

## 2016-08-16 MED ORDER — HYDROMORPHONE 1 MG/ML IV SOLN
INTRAVENOUS | Status: DC
  Administered 2016-08-16: 7 mg via INTRAVENOUS
  Administered 2016-08-16: 01:00:00 via INTRAVENOUS
  Filled 2016-08-16: qty 25

## 2016-08-16 NOTE — Progress Notes (Signed)
SICKLE CELL SERVICE PROGRESS NOTE  Carmen [name] [id number] DOB: 1991/09/30 DOA: 08/15/2016 PCP: [name], MD  Assessment/Plan: Active Problems:   Sickle cell pain crisis (HCC)   Sickle cell anemia (HCC)   Breast lump  1. Hb SS with crisis: Will continue PCA at current dose and add dilaudid 1 mg every 2 hours. Continue Toradol by IV. Add Pepcid for GI protection while on NSAID. 2. Anemia of chronic disease: Hb Stable.  3. Emesis: Pt reports emesis that is unrecorded or unobserved by nurses. She often c/o emesis during her admissions. However once asked to keep emetic contents for nursing staff. The emesis resolves. Will continue to monitor. However abdomen soft and non-tender. 4. Chronic pain: Continue MS Contin.  5. Breast Mass: Pt reports mass has been present for 2-3 months. Advised patient to follow up with her PMD-Dr. Alyson Ingles.   Code Status: Full Code Family Communication: N/A Disposition Plan: Not yet ready for discharge. Pt currently on telemetry unit will transfer to med-surg unit.  Carmen [name] A.  Pager [phone]. If 7PM-7AM, please contact night-coverage.  08/16/2016, 1:57 PM  LOS: 1 day   Interim History: Pt reports that her pain is localized to back, legs and chest. She rates pain as 8/10. Pt also states that the lump in her breast has ben there for more than 3 months however she did not bring this to anyone's attention as she thought it was a fatty mass. She reports that she has been having emesis all day, however the nurse reports that she has not observed any emesis.   Consultants:  None  Procedures:  None  Antibiotics:  None   Objective: Vitals:   08/16/16 0030 08/16/16 0045 08/16/16 0108 08/16/16 0405  BP: 104/76 106/61    Pulse:  60    Resp: 11 15 15 16   Temp:      TempSrc:  Oral    SpO2: 98% 100% 100% 100%  Weight:      Height:       Weight change:   Intake/Output Summary (Last 24 hours) at 08/16/16 1357 Last data filed at  08/16/16 0428  Gross per 24 hour  Intake           353.33 ml  Output                0 ml  Net           353.33 ml    General: Alert, awake, oriented x3, in no acute distress.  HEENT: [place]/AT PEERL, EOMI, anicteric Neck: Trachea midline,  no masses, no thyromegal,y no JVD, no carotid bruit OROPHARYNX:  Moist, No exudate/ erythema/lesions.  Heart: Regular rate and rhythm, without murmurs, rubs, gallops, PMI non-displaced, no heaves or thrills on palpation.  Lungs: Clear to auscultation, no wheezing or rhonchi noted. No increased vocal fremitus resonant to percussion  Abdomen: Soft, nontender, nondistended, positive bowel sounds, no masses no hepatosplenomegaly noted..  Neuro: No focal neurological deficits noted cranial nerves II through XII grossly intact. Strength at baseline in bilateral upper and lower extremities. Musculoskeletal: No warmth swelling or erythema around joints, no spinal tenderness noted. Psychiatric: Patient alert and oriented x3, good insight and cognition, good recent to remote recall.    Data Reviewed: Basic Metabolic Panel:  Recent Labs Lab 08/15/16 1905  NA 139  K 3.7  CL 109  CO2 21*  GLUCOSE 105*  BUN 22*  CREATININE 0.66  CALCIUM 9.5   Liver Function Tests:  Recent Labs Lab 08/15/16  1905  AST 61*  ALT 70*  ALKPHOS 62  BILITOT 1.8*  PROT 8.5*  ALBUMIN 4.6   No results for input(s): LIPASE, AMYLASE in the last 168 hours. No results for input(s): AMMONIA in the last 168 hours. CBC:  Recent Labs Lab 08/15/16 1905  WBC 10.0  NEUTROABS 4.8  HGB 9.4*  HCT 26.1*  MCV 81.1  PLT 381   Cardiac Enzymes: No results for input(s): CKTOTAL, CKMB, CKMBINDEX, TROPONINI in the last 168 hours. BNP (last 3 results) No results for input(s): BNP in the last 8760 hours.  ProBNP (last 3 results) No results for input(s): PROBNP in the last 8760 hours.  CBG: No results for input(s): GLUCAP in the last 168 hours.  No results found for this or any  previous visit (from the past 240 hour(s)).   Studies: Dg Chest 2 View  Result Date: 08/15/2016 CLINICAL DATA:  Chest pain for 3 days. EXAM: CHEST  2 VIEW COMPARISON:  None. FINDINGS: The cardiomediastinal silhouette is unremarkable. There is no evidence of focal airspace disease, pulmonary edema, suspicious pulmonary nodule/mass, pleural effusion, or pneumothorax. No acute bony abnormalities are identified. IMPRESSION: No active cardiopulmonary disease. Electronically Signed   By: Margarette Canada M.D.   On: 08/15/2016 18:55    Scheduled Meds: . enoxaparin (LOVENOX) injection  40 mg Subcutaneous Q24H  . folic acid  1 mg Oral Daily  . HYDROmorphone   Intravenous Q4H  . hydroxyurea  500 mg Oral BID  . [START ON 08/17/2016] Influenza vac split quadrivalent PF  0.5 mL Intramuscular Tomorrow-1000  . ketorolac  30 mg Intravenous Q6H  . morphine  60 mg Oral Q12H  . senna-docusate  1 tablet Oral BID   Continuous Infusions: . sodium chloride 100 mL/hr at 08/16/16 0056      In excess of 25 minutes spent during this visit. Greater than 50% involved face to face contact with the patient for assessment, counseling and coordination of care.

## 2016-08-16 NOTE — Progress Notes (Signed)
Patient has been complaining of her PIV swollen,  Site was  Checked and flushed , intact and  blood return noted.

## 2016-08-17 DIAGNOSIS — T148XXA Other injury of unspecified body region, initial encounter: Secondary | ICD-10-CM

## 2016-08-17 DIAGNOSIS — K5900 Constipation, unspecified: Secondary | ICD-10-CM

## 2016-08-17 DIAGNOSIS — N63 Unspecified lump in unspecified breast: Secondary | ICD-10-CM

## 2016-08-17 LAB — BASIC METABOLIC PANEL
ANION GAP: 8 (ref 5–15)
BUN: 8 mg/dL (ref 6–20)
CHLORIDE: 99 mmol/L — AB (ref 101–111)
CO2: 27 mmol/L (ref 22–32)
Calcium: 9.8 mg/dL (ref 8.9–10.3)
Creatinine, Ser: 0.39 mg/dL — ABNORMAL LOW (ref 0.44–1.00)
GFR calc Af Amer: >60 mL/min (ref >60)
GFR calc non Af Amer: >60 mL/min (ref >60)
GLUCOSE: 96 mg/dL (ref 65–99)
POTASSIUM: 3.4 mmol/L — AB (ref 3.5–5.1)
Sodium: 134 mmol/L — ABNORMAL LOW (ref 135–145)

## 2016-08-17 LAB — CBC WITH DIFFERENTIAL/PLATELET
Basophils Absolute: 0 10*3/uL (ref 0.0–0.1)
Basophils Relative: 0 %
Eosinophils Absolute: 0.2 10*3/uL (ref 0.0–0.7)
Eosinophils Relative: 3 %
HEMATOCRIT: 22.9 % — AB (ref 36.0–46.0)
HEMOGLOBIN: 8.4 g/dL — AB (ref 12.0–15.0)
LYMPHS ABS: 4.2 10*3/uL — AB (ref 0.7–4.0)
Lymphocytes Relative: 52 %
MCH: 29.1 pg (ref 26.0–34.0)
MCHC: 36.7 g/dL — AB (ref 30.0–36.0)
MCV: 79.2 fL (ref 78.0–100.0)
MONO ABS: 1.1 10*3/uL — AB (ref 0.1–1.0)
MONOS PCT: 13 %
NEUTROS ABS: 2.5 10*3/uL (ref 1.7–7.7)
NEUTROS PCT: 31 %
Platelets: 426 10*3/uL — ABNORMAL HIGH (ref 150–400)
RBC: 2.89 MIL/uL — ABNORMAL LOW (ref 3.87–5.11)
RDW: 17.3 % — AB (ref 11.5–15.5)
WBC: 8.1 10*3/uL (ref 4.0–10.5)

## 2016-08-17 LAB — RETICULOCYTES
RBC.: 2.89 MIL/uL — ABNORMAL LOW (ref 3.87–5.11)
Retic Count, Absolute: 187.9 10*3/uL — ABNORMAL HIGH (ref 19.0–186.0)
Retic Ct Pct: 6.5 % — ABNORMAL HIGH (ref 0.4–3.1)

## 2016-08-17 MED ORDER — MAGNESIUM CITRATE PO SOLN
1.0000 | Freq: Once | ORAL | Status: AC
  Administered 2016-08-17: 1 via ORAL
  Filled 2016-08-17: qty 296

## 2016-08-17 MED ORDER — SODIUM CHLORIDE 0.9 % IV SOLN
INTRAVENOUS | Status: DC
  Administered 2016-08-17 – 2016-08-21 (×5): via INTRAVENOUS
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
## 2016-08-18 DIAGNOSIS — K5903 Drug induced constipation: Secondary | ICD-10-CM

## 2016-08-18 DIAGNOSIS — T148XXA Other injury of unspecified body region, initial encounter: Secondary | ICD-10-CM

## 2016-08-18 DIAGNOSIS — K5900 Constipation, unspecified: Secondary | ICD-10-CM

## 2016-08-18 MED ORDER — LIP MEDEX EX OINT
TOPICAL_OINTMENT | CUTANEOUS | Status: AC
  Administered 2016-08-18: 15:00:00
  Filled 2016-08-18: qty 7

## 2016-08-18 MED ORDER — ZOLPIDEM TARTRATE 5 MG PO TABS
5.0000 mg | ORAL_TABLET | Freq: Every evening | ORAL | Status: DC | PRN
  Administered 2016-08-18 – 2016-08-22 (×5): 5 mg via ORAL
  Filled 2016-08-18 (×5): qty 1

## 2016-08-18 NOTE — Progress Notes (Signed)
Patient was without iv access for 3 hours this am,,m resulting in increased pain due to no iv pain meds during this time
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
## 2016-08-18 NOTE — Progress Notes (Signed)
Patient wants iv site dressing changed, dressing is dry and intact.RN told patient that as per policy we don't change iv dressing every day, she claims other nurses do.Patient said she wants another nurse to take care of her.Jena Gauss RN ND , made aware.

## 2016-08-18 NOTE — Care Management Note (Signed)
Case Management Note  Patient Details  Name: Carmen [name] MRN: [id number] Date of Birth: [birth year]  Subjective/Objective:    25 yo admitted with SCC.                Action/Plan: From home with parent. Chart reviewed and CM following for DC needs.  Expected Discharge Date:                  Expected Discharge Plan:  Home/Self Care  In-House Referral:     Discharge planning Services  CM Consult  Post Acute Care Choice:    Choice offered to:     DME Arranged:    DME Agency:     HH Arranged:    HH Agency:     Status of Service:  In process, will continue to follow  If discussed at Long Length of Stay Meetings, dates discussed:    Additional CommentsLynnell Catalan, RN 08/18/2016, 1:17 PM [phone]

## 2016-08-19 LAB — PREGNANCY, URINE: PREG TEST UR: NEGATIVE

## 2016-08-19 LAB — BASIC METABOLIC PANEL
ANION GAP: 5 (ref 5–15)
BUN: 5 mg/dL — ABNORMAL LOW (ref 6–20)
CHLORIDE: 102 mmol/L (ref 101–111)
CO2: 28 mmol/L (ref 22–32)
CREATININE: 0.39 mg/dL — AB (ref 0.44–1.00)
Calcium: 9.1 mg/dL (ref 8.9–10.3)
GFR calc non Af Amer: >60 mL/min (ref >60)
Glucose, Bld: 88 mg/dL (ref 65–99)
Potassium: 3.8 mmol/L (ref 3.5–5.1)
SODIUM: 135 mmol/L (ref 135–145)

## 2016-08-19 MED ORDER — HYDROMORPHONE HCL 2 MG/ML IJ SOLN
2.0000 mg | INTRAMUSCULAR | Status: DC
  Administered 2016-08-19 – 2016-08-22 (×28): 2 mg via INTRAVENOUS
  Filled 2016-08-19 (×29): qty 1

## 2016-08-19 MED ORDER — IBUPROFEN 800 MG PO TABS
800.0000 mg | ORAL_TABLET | Freq: Three times a day (TID) | ORAL | Status: DC
  Administered 2016-08-19 – 2016-08-21 (×7): 800 mg via ORAL
  Filled 2016-08-19 (×7): qty 1

## 2016-08-19 MED ORDER — FAMOTIDINE 20 MG PO TABS
20.0000 mg | ORAL_TABLET | Freq: Two times a day (BID) | ORAL | Status: DC
  Administered 2016-08-19 – 2016-08-23 (×8): 20 mg via ORAL
  Filled 2016-08-19 (×8): qty 1

## 2016-08-19 NOTE — Progress Notes (Signed)
Patient c/o sharp  pain  to chest that radiates to back and arms. Patient appears restless,can't keep self comfortable,pacing in the room,pain 10/10.  Encouraged to push PCA. Md notified re; this matter. Vital signs taken by NT. Will continue to monitor.
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
## 2016-08-20 MED ORDER — SODIUM CHLORIDE 0.9% FLUSH: 10.0000 mL | INTRAVENOUS | Status: DC | PRN

## 2016-08-20 MED ORDER — SODIUM CHLORIDE 0.9% FLUSH
10.0000 mL | Freq: Two times a day (BID) | INTRAVENOUS | Status: DC
  Administered 2016-08-20 – 2016-08-22 (×3): 10 mL
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
## 2016-08-20 NOTE — Progress Notes (Signed)
Peripherally Inserted Central Catheter/Midline Placement  The IV Nurse has discussed with the patient and/or persons authorized to consent for the patient, the purpose of this procedure and the potential benefits and risks involved with this procedure.  The benefits include less needle sticks, lab draws from the catheter, and the patient may be discharged home with the catheter. Risks include, but not limited to, infection, bleeding, blood clot (thrombus formation), and puncture of an artery; nerve damage and irregular heartbeat and possibility to perform a PICC exchange if needed/ordered by physician.  Alternatives to this procedure were also discussed.  Bard Power PICC patient education guide, fact sheet on infection prevention and patient information card has been provided to patient /or left at bedside.    PICC/Midline Placement Documentation        Carmen [name] 08/20/2016, 8:05 AM

## 2016-08-20 NOTE — Progress Notes (Signed)
Pt c/o itching and irritation in vaginal area. States she didn't mention it to MD during rounds. MD notified. Orders received. Will continue to monitor.

## 2016-08-21 DIAGNOSIS — F4322 Adjustment disorder with anxiety: Secondary | ICD-10-CM

## 2016-08-21 DIAGNOSIS — L298 Other pruritus: Secondary | ICD-10-CM

## 2016-08-21 MED ORDER — KETOROLAC TROMETHAMINE 30 MG/ML IJ SOLN
30.0000 mg | Freq: Four times a day (QID) | INTRAMUSCULAR | Status: DC
  Administered 2016-08-21 – 2016-08-23 (×7): 30 mg via INTRAVENOUS
  Filled 2016-08-21 (×7): qty 1

## 2016-08-21 MED ORDER — MAGNESIUM CITRATE PO SOLN
1.0000 | Freq: Once | ORAL | Status: AC
  Administered 2016-08-21: 1 via ORAL
  Filled 2016-08-21: qty 296

## 2016-08-21 NOTE — Progress Notes (Signed)
Patient ID: Carmen [name], female   DOB: 14-Apr-1991, 25 y.o.   MRN: [id number]  I have made 4 previous attempts to see patient today and have come back at 6:42 pm to attempt to see her and she has been in the bathroom on al attempts. Pt has been unavailable for her 53 visit.
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
## 2016-08-22 DIAGNOSIS — F4323 Adjustment disorder with mixed anxiety and depressed mood: Secondary | ICD-10-CM

## 2016-08-22 DIAGNOSIS — N898 Other specified noninflammatory disorders of vagina: Secondary | ICD-10-CM

## 2016-08-22 DIAGNOSIS — B373 Candidiasis of vulva and vagina: Secondary | ICD-10-CM

## 2016-08-22 LAB — CREATININE, SERUM
CREATININE: 0.4 mg/dL — AB (ref 0.44–1.00)
GFR calc Af Amer: >60 mL/min (ref >60)
GFR calc non Af Amer: >60 mL/min (ref >60)

## 2016-08-22 LAB — WET PREP, GENITAL
SPERM: NONE SEEN
Trich, Wet Prep: NONE SEEN
YEAST WET PREP: NONE SEEN

## 2016-08-22 MED ORDER — OXYCODONE HCL 5 MG PO TABS
5.0000 mg | ORAL_TABLET | ORAL | Status: DC
  Administered 2016-08-22 – 2016-08-23 (×8): 5 mg via ORAL
  Filled 2016-08-22 (×8): qty 1

## 2016-08-22 MED ORDER — FLUCONAZOLE 150 MG PO TABS
150.0000 mg | ORAL_TABLET | Freq: Once | ORAL | Status: AC
  Administered 2016-08-22: 150 mg via ORAL
  Filled 2016-08-22: qty 1

## 2016-08-22 MED ORDER — HYDROMORPHONE HCL 2 MG/ML IJ SOLN
2.0000 mg | INTRAMUSCULAR | Status: DC | PRN
  Administered 2016-08-22 (×2): 2 mg via INTRAVENOUS
  Filled 2016-08-22 (×2): qty 1

## 2016-08-22 MED ORDER — OXYCODONE-ACETAMINOPHEN 5-325 MG PO TABS
1.0000 | ORAL_TABLET | ORAL | Status: DC
  Administered 2016-08-22 – 2016-08-23 (×8): 1 via ORAL
  Filled 2016-08-22 (×8): qty 1

## 2016-08-22 MED ORDER — METRONIDAZOLE 500 MG PO TABS
500.0000 mg | ORAL_TABLET | Freq: Two times a day (BID) | ORAL | Status: DC
  Administered 2016-08-22 – 2016-08-23 (×2): 500 mg via ORAL
  Filled 2016-08-22 (×2): qty 1
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
## 2016-08-23 LAB — CBC WITH DIFFERENTIAL/PLATELET
BASOS ABS: 0.1 10*3/uL (ref 0.0–0.1)
Basophils Relative: 1 %
EOS ABS: 0.5 10*3/uL (ref 0.0–0.7)
Eosinophils Relative: 5 %
HEMATOCRIT: 20.5 % — AB (ref 36.0–46.0)
Hemoglobin: 7.2 g/dL — ABNORMAL LOW (ref 12.0–15.0)
LYMPHS ABS: 3.8 10*3/uL (ref 0.7–4.0)
LYMPHS PCT: 37 %
MCH: 29 pg (ref 26.0–34.0)
MCHC: 35.1 g/dL (ref 30.0–36.0)
MCV: 82.7 fL (ref 78.0–100.0)
MONOS PCT: 14 %
Monocytes Absolute: 1.4 10*3/uL — ABNORMAL HIGH (ref 0.1–1.0)
Neutro Abs: 4.4 10*3/uL (ref 1.7–7.7)
Neutrophils Relative %: 43 %
Platelets: 541 10*3/uL — ABNORMAL HIGH (ref 150–400)
RBC: 2.48 MIL/uL — AB (ref 3.87–5.11)
RDW: 19.1 % — AB (ref 11.5–15.5)
WBC: 10.2 10*3/uL (ref 4.0–10.5)

## 2016-08-23 LAB — HIV ANTIBODY (ROUTINE TESTING W REFLEX): HIV SCREEN 4TH GENERATION: NONREACTIVE

## 2016-08-23 LAB — BASIC METABOLIC PANEL
ANION GAP: 4 — AB (ref 5–15)
BUN: <5 mg/dL — ABNORMAL LOW (ref 6–20)
CALCIUM: 9.2 mg/dL (ref 8.9–10.3)
CO2: 29 mmol/L (ref 22–32)
CREATININE: 0.52 mg/dL (ref 0.44–1.00)
Chloride: 104 mmol/L (ref 101–111)
Glucose, Bld: 91 mg/dL (ref 65–99)
Potassium: 3.7 mmol/L (ref 3.5–5.1)
SODIUM: 137 mmol/L (ref 135–145)

## 2016-08-23 LAB — RETICULOCYTES
RBC.: 2.41 MIL/uL — AB (ref 3.87–5.11)
RETIC COUNT ABSOLUTE: 395.2 10*3/uL — AB (ref 19.0–186.0)
RETIC CT PCT: 16.4 % — AB (ref 0.4–3.1)

## 2016-08-23 LAB — GC/CHLAMYDIA PROBE AMP (~~LOC~~) NOT AT ARMC
CHLAMYDIA, DNA PROBE: NEGATIVE
Neisseria Gonorrhea: NEGATIVE

## 2016-08-23 MED ORDER — FLUCONAZOLE 150 MG PO TABS: 150.0000 mg | ORAL_TABLET | Freq: Once | ORAL | 0 refills | Status: AC

## 2016-08-23 MED ORDER — SORBITOL 70 % SOLN
960.0000 mL | TOPICAL_OIL | Freq: Once | ORAL | Status: AC
  Administered 2016-08-23: 960 mL via RECTAL
  Filled 2016-08-23: qty 240

## 2016-08-23 MED ORDER — METRONIDAZOLE 500 MG PO TABS: 500.0000 mg | ORAL_TABLET | Freq: Two times a day (BID) | ORAL | 0 refills | Status: DC

## 2016-08-23 NOTE — Progress Notes (Signed)
This RN told patient MD was continuing with discharge orders since [facility] enema was no longer necessary (patient refused). RN told patient she would stop the PCA and place the order for PICC to be removed once discharge orders were placed. Pt then replied, "I was trying to hold off on the enema but I guess I do need it. I will get home and regret it if not." MD Zigmund Daniel aware. Will administer SMOG enema as ordered.

## 2016-08-23 NOTE — Progress Notes (Signed)
Patient asked to speak with MD. When told MD was busy at this time seeing other patients, this pt replied, "I shouldn't have to wait until she's ready. She should talk to me when I need her to." Patient also stated to RN, "It's OK, I'm already talking to somebody about the trashiness of this hospital." SMOG enema given as ordered and by patient's request- pt able to only tolerate 542ml before needing to go to [facility] to attempt to have a BM. Will continue to monitor.
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
## 2016-08-23 NOTE — Progress Notes (Signed)
RN offered to administer ordered SMOG Enema multiple times but patient refused. Pt stated she had a "small poop that wasn't hard" after eating breakfast today. Pt also stated, "I don't think I need it. Everything is moving so I don't think it's something I need right now. I had a good poop two days ago after that nurse gave me some magnesium citrate." MD Zigmund Daniel made aware. Will continue to monitor.

## 2016-08-23 NOTE — Progress Notes (Signed)
Per MD Zigmund Daniel, continues PCA pain medication until patient's D/C transportation arrives around 1600.

## 2016-08-23 NOTE — Progress Notes (Signed)
Pt had medium, brown, soft, formed BM that was visualized by RN. Also had 2 other BMs that were flushed without RN witnessing.

## 2016-08-25 LAB — HSV CULTURE AND TYPING

## 2016-08-26 ENCOUNTER — Other Ambulatory Visit: Payer: Self-pay | Admitting: Family Medicine

## 2016-08-26 DIAGNOSIS — N6310 Unspecified lump in the right breast, unspecified quadrant: Secondary | ICD-10-CM

## 2016-08-30 ENCOUNTER — Inpatient Hospital Stay (HOSPITAL_COMMUNITY)
Admission: EM | Admit: 2016-08-30 | Discharge: 2016-09-05 | DRG: 812 | Disposition: A | Discharge status: Home / Self Care | Payer: Medicaid Other | Attending: Internal Medicine | Admitting: Internal Medicine

## 2016-08-30 ENCOUNTER — Encounter (HOSPITAL_COMMUNITY): Payer: Self-pay

## 2016-08-30 DIAGNOSIS — D473 Essential (hemorrhagic) thrombocythemia: Secondary | ICD-10-CM | POA: Diagnosis present

## 2016-08-30 DIAGNOSIS — R7989 Other specified abnormal findings of blood chemistry: Secondary | ICD-10-CM | POA: Diagnosis present

## 2016-08-30 DIAGNOSIS — R1110 Vomiting, unspecified: Secondary | ICD-10-CM | POA: Diagnosis present

## 2016-08-30 DIAGNOSIS — Z841 Family history of disorders of kidney and ureter: Secondary | ICD-10-CM

## 2016-08-30 DIAGNOSIS — Z8261 Family history of arthritis: Secondary | ICD-10-CM

## 2016-08-30 DIAGNOSIS — D75839 Thrombocytosis, unspecified: Secondary | ICD-10-CM | POA: Diagnosis present

## 2016-08-30 DIAGNOSIS — Z8249 Family history of ischemic heart disease and other diseases of the circulatory system: Secondary | ICD-10-CM

## 2016-08-30 DIAGNOSIS — Z79891 Long term (current) use of opiate analgesic: Secondary | ICD-10-CM | POA: Diagnosis not present

## 2016-08-30 DIAGNOSIS — D72829 Elevated white blood cell count, unspecified: Secondary | ICD-10-CM | POA: Diagnosis present

## 2016-08-30 DIAGNOSIS — K5900 Constipation, unspecified: Secondary | ICD-10-CM | POA: Diagnosis not present

## 2016-08-30 DIAGNOSIS — Z832 Family history of diseases of the blood and blood-forming organs and certain disorders involving the immune mechanism: Secondary | ICD-10-CM | POA: Diagnosis not present

## 2016-08-30 DIAGNOSIS — Z79899 Other long term (current) drug therapy: Secondary | ICD-10-CM

## 2016-08-30 DIAGNOSIS — Z9081 Acquired absence of spleen: Secondary | ICD-10-CM | POA: Diagnosis not present

## 2016-08-30 DIAGNOSIS — Z9049 Acquired absence of other specified parts of digestive tract: Secondary | ICD-10-CM | POA: Diagnosis not present

## 2016-08-30 DIAGNOSIS — D5700 Hb-SS disease with crisis, unspecified: Principal | ICD-10-CM | POA: Diagnosis present

## 2016-08-30 DIAGNOSIS — D638 Anemia in other chronic diseases classified elsewhere: Secondary | ICD-10-CM | POA: Diagnosis present

## 2016-08-30 DIAGNOSIS — D7589 Other specified diseases of blood and blood-forming organs: Secondary | ICD-10-CM | POA: Diagnosis present

## 2016-08-30 LAB — URINALYSIS, ROUTINE W REFLEX MICROSCOPIC
Bilirubin Urine: NEGATIVE
Glucose, UA: NEGATIVE mg/dL
HGB URINE DIPSTICK: NEGATIVE
Ketones, ur: NEGATIVE mg/dL
Leukocytes, UA: NEGATIVE
Nitrite: NEGATIVE
PROTEIN: NEGATIVE mg/dL
Specific Gravity, Urine: 1.018 (ref 1.005–1.030)
pH: 6 (ref 5.0–8.0)

## 2016-08-30 LAB — CBC WITH DIFFERENTIAL/PLATELET
BASOS ABS: 0 10*3/uL (ref 0.0–0.1)
BASOS PCT: 0 %
Eosinophils Absolute: 0.1 10*3/uL (ref 0.0–0.7)
Eosinophils Relative: 1 %
HEMATOCRIT: 26.1 % — AB (ref 36.0–46.0)
HEMOGLOBIN: 9.2 g/dL — AB (ref 12.0–15.0)
LYMPHS PCT: 30 %
Lymphs Abs: 4.5 10*3/uL — ABNORMAL HIGH (ref 0.7–4.0)
MCH: 28.9 pg (ref 26.0–34.0)
MCHC: 35.2 g/dL (ref 30.0–36.0)
MCV: 82.1 fL (ref 78.0–100.0)
MONO ABS: 1.8 10*3/uL — AB (ref 0.1–1.0)
Monocytes Relative: 12 %
NEUTROS ABS: 8.4 10*3/uL — AB (ref 1.7–7.7)
NEUTROS PCT: 57 %
Platelets: 599 10*3/uL — ABNORMAL HIGH (ref 150–400)
RBC: 3.18 MIL/uL — AB (ref 3.87–5.11)
RDW: 15.3 % (ref 11.5–15.5)
WBC: 14.8 10*3/uL — AB (ref 4.0–10.5)

## 2016-08-30 LAB — RETICULOCYTES
RBC.: 3.18 MIL/uL — AB (ref 3.87–5.11)
RETIC CT PCT: 4.7 % — AB (ref 0.4–3.1)
Retic Count, Absolute: 149.5 10*3/uL (ref 19.0–186.0)

## 2016-08-30 LAB — TROPONIN I

## 2016-08-30 LAB — COMPREHENSIVE METABOLIC PANEL
ALK PHOS: 58 U/L (ref 38–126)
ALT: 19 U/L (ref 14–54)
ANION GAP: 5 (ref 5–15)
AST: 21 U/L (ref 15–41)
Albumin: 4.1 g/dL (ref 3.5–5.0)
BILIRUBIN TOTAL: 1.4 mg/dL — AB (ref 0.3–1.2)
BUN: 12 mg/dL (ref 6–20)
CALCIUM: 9.4 mg/dL (ref 8.9–10.3)
CO2: 23 mmol/L (ref 22–32)
Chloride: 115 mmol/L — ABNORMAL HIGH (ref 101–111)
Creatinine, Ser: 0.63 mg/dL (ref 0.44–1.00)
Glucose, Bld: 96 mg/dL (ref 65–99)
POTASSIUM: 3.6 mmol/L (ref 3.5–5.1)
Sodium: 143 mmol/L (ref 135–145)
TOTAL PROTEIN: 7.5 g/dL (ref 6.5–8.1)

## 2016-08-30 MED ORDER — HYDROMORPHONE HCL 2 MG/ML IJ SOLN
2.0000 mg | INTRAMUSCULAR | Status: AC
  Administered 2016-08-30: 2 mg via INTRAVENOUS
  Filled 2016-08-30: qty 1

## 2016-08-30 MED ORDER — HYDROMORPHONE 1 MG/ML IV SOLN
INTRAVENOUS | Status: DC
  Administered 2016-08-30: 5 mg via INTRAVENOUS
  Administered 2016-08-30: 2 mg via INTRAVENOUS
  Administered 2016-08-30: 06:00:00 via INTRAVENOUS
  Filled 2016-08-30: qty 25

## 2016-08-30 MED ORDER — NALOXONE HCL 0.4 MG/ML IJ SOLN: 0.4000 mg | INTRAMUSCULAR | Status: DC | PRN

## 2016-08-30 MED ORDER — KETOROLAC TROMETHAMINE 30 MG/ML IJ SOLN
30.0000 mg | INTRAMUSCULAR | Status: AC
  Administered 2016-08-30: 30 mg via INTRAVENOUS
  Filled 2016-08-30: qty 1

## 2016-08-30 MED ORDER — FOLIC ACID 1 MG PO TABS
1.0000 mg | ORAL_TABLET | Freq: Every day | ORAL | Status: DC
  Administered 2016-08-30 – 2016-09-05 (×7): 1 mg via ORAL
  Filled 2016-08-30 (×7): qty 1

## 2016-08-30 MED ORDER — HYDROMORPHONE HCL 2 MG/ML IJ SOLN: 2.0000 mg | INTRAMUSCULAR | Status: AC

## 2016-08-30 MED ORDER — KETOROLAC TROMETHAMINE 30 MG/ML IJ SOLN
30.0000 mg | Freq: Four times a day (QID) | INTRAMUSCULAR | Status: AC
  Administered 2016-08-30 – 2016-09-04 (×19): 30 mg via INTRAVENOUS
  Filled 2016-08-30 (×20): qty 1

## 2016-08-30 MED ORDER — DIPHENHYDRAMINE HCL 50 MG/ML IJ SOLN
25.0000 mg | INTRAMUSCULAR | Status: DC | PRN
  Filled 2016-08-30: qty 0.5

## 2016-08-30 MED ORDER — SODIUM CHLORIDE 0.45 % IV SOLN
INTRAVENOUS | Status: DC
  Administered 2016-08-30 – 2016-09-03 (×6): via INTRAVENOUS
  Administered 2016-09-03: 1000 mL via INTRAVENOUS
  Administered 2016-09-03 – 2016-09-04 (×2): via INTRAVENOUS

## 2016-08-30 MED ORDER — ONDANSETRON HCL 4 MG/2ML IJ SOLN
4.0000 mg | Freq: Four times a day (QID) | INTRAMUSCULAR | Status: DC | PRN
  Administered 2016-08-30 – 2016-09-05 (×7): 4 mg via INTRAVENOUS
  Filled 2016-08-30 (×8): qty 2

## 2016-08-30 MED ORDER — HYDROMORPHONE HCL 2 MG/ML IJ SOLN: 2.0000 mg | INTRAMUSCULAR | Status: DC

## 2016-08-30 MED ORDER — HYDROXYUREA 500 MG PO CAPS
500.0000 mg | ORAL_CAPSULE | Freq: Two times a day (BID) | ORAL | Status: DC
  Administered 2016-08-30 – 2016-09-03 (×8): 500 mg via ORAL
  Filled 2016-08-30 (×9): qty 1

## 2016-08-30 MED ORDER — ENOXAPARIN SODIUM 40 MG/0.4ML ~~LOC~~ SOLN
40.0000 mg | SUBCUTANEOUS | Status: DC
  Administered 2016-08-30 – 2016-09-04 (×3): 40 mg via SUBCUTANEOUS
  Filled 2016-08-30 (×7): qty 0.4

## 2016-08-30 MED ORDER — DIPHENHYDRAMINE HCL 25 MG PO CAPS
25.0000 mg | ORAL_CAPSULE | ORAL | Status: DC | PRN
  Administered 2016-09-04: 50 mg via ORAL
  Filled 2016-08-30: qty 2

## 2016-08-30 MED ORDER — DIPHENHYDRAMINE HCL 50 MG/ML IJ SOLN
25.0000 mg | Freq: Once | INTRAMUSCULAR | Status: AC
  Administered 2016-08-30: 25 mg via INTRAVENOUS
  Filled 2016-08-30: qty 1

## 2016-08-30 MED ORDER — SENNOSIDES-DOCUSATE SODIUM 8.6-50 MG PO TABS
1.0000 | ORAL_TABLET | Freq: Two times a day (BID) | ORAL | Status: DC
  Administered 2016-08-30 – 2016-09-05 (×12): 1 via ORAL
  Filled 2016-08-30 (×13): qty 1

## 2016-08-30 MED ORDER — SODIUM CHLORIDE 0.9% FLUSH: 9.0000 mL | INTRAVENOUS | Status: DC | PRN

## 2016-08-30 MED ORDER — ACETAMINOPHEN 500 MG PO TABS
1000.0000 mg | ORAL_TABLET | Freq: Four times a day (QID) | ORAL | Status: DC | PRN
Start: 2016-08-30 — End: 2016-09-03

## 2016-08-30 MED ORDER — HYDROMORPHONE 1 MG/ML IV SOLN
INTRAVENOUS | Status: DC
  Administered 2016-08-30: 4.2 mg via INTRAVENOUS
  Administered 2016-08-30: 6 mg via INTRAVENOUS
  Administered 2016-08-30: 5.1 mg via INTRAVENOUS
  Administered 2016-08-31 (×2): 5.4 mg via INTRAVENOUS
  Administered 2016-08-31: 9.6 mg via INTRAVENOUS
  Administered 2016-08-31: 6 mg via INTRAVENOUS
  Administered 2016-08-31 (×2): via INTRAVENOUS
  Administered 2016-08-31: 2.4 mg via INTRAVENOUS
  Administered 2016-09-01: 11.4 mg via INTRAVENOUS
  Administered 2016-09-01: 13:00:00 via INTRAVENOUS
  Administered 2016-09-01: 14 mg via INTRAVENOUS
  Administered 2016-09-01: 5.4 mg via INTRAVENOUS
  Administered 2016-09-01: 7 mg via INTRAVENOUS
  Administered 2016-09-01: 1.8 mg via INTRAVENOUS
  Administered 2016-09-01: 10.12 mg via INTRAVENOUS
  Administered 2016-09-01: 9 mg via INTRAVENOUS
  Administered 2016-09-02: 6 mg via INTRAVENOUS
  Administered 2016-09-02: 25 mg via INTRAVENOUS
  Administered 2016-09-02: 8.4 mg via INTRAVENOUS
  Administered 2016-09-02: 5 mg via INTRAVENOUS
  Administered 2016-09-02: 10:00:00 via INTRAVENOUS
  Administered 2016-09-02: 0 mg via INTRAVENOUS
  Administered 2016-09-02: 9 mg via INTRAVENOUS
  Administered 2016-09-03: 3 mg via INTRAVENOUS
  Administered 2016-09-03: 14:00:00 via INTRAVENOUS
  Administered 2016-09-03: 0 mg via INTRAVENOUS
  Administered 2016-09-03: 8.6 mg via INTRAVENOUS
  Administered 2016-09-03: 3 mg via INTRAVENOUS
  Administered 2016-09-03: 12.6 mg via INTRAVENOUS
  Administered 2016-09-04: 10.3 mg via INTRAVENOUS
  Administered 2016-09-04: 3.8 mg via INTRAVENOUS
  Administered 2016-09-04: 5.99 mg via INTRAVENOUS
  Filled 2016-08-30 (×7): qty 25

## 2016-08-30 MED ORDER — DIPHENHYDRAMINE HCL 25 MG PO CAPS: 25.0000 mg | ORAL_CAPSULE | ORAL | Status: DC | PRN

## 2016-08-30 MED ORDER — ZOLPIDEM TARTRATE 5 MG PO TABS
5.0000 mg | ORAL_TABLET | Freq: Every evening | ORAL | Status: DC | PRN
  Administered 2016-08-30 – 2016-09-04 (×6): 5 mg via ORAL
  Filled 2016-08-30 (×6): qty 1

## 2016-08-30 MED ORDER — SODIUM CHLORIDE 0.9 % IV SOLN
25.0000 mg | INTRAVENOUS | Status: DC | PRN
  Filled 2016-08-30: qty 0.5

## 2016-08-30 MED ORDER — ONDANSETRON HCL 4 MG/2ML IJ SOLN: 4.0000 mg | Freq: Four times a day (QID) | INTRAMUSCULAR | Status: DC | PRN

## 2016-08-30 MED ORDER — POLYETHYLENE GLYCOL 3350 17 G PO PACK: 17.0000 g | PACK | Freq: Every day | ORAL | Status: DC | PRN

## 2016-08-30 MED ORDER — MORPHINE SULFATE ER 30 MG PO TBCR
60.0000 mg | EXTENDED_RELEASE_TABLET | Freq: Two times a day (BID) | ORAL | Status: DC
  Administered 2016-08-30 – 2016-09-05 (×13): 60 mg via ORAL
  Filled 2016-08-30 (×13): qty 2

## 2016-08-30 MED ORDER — PROMETHAZINE HCL 25 MG PO TABS: 25.0000 mg | ORAL_TABLET | ORAL | Status: DC | PRN

## 2016-08-30 NOTE — ED Notes (Signed)
Bed: [id number] Expected date:  Expected time:  Means of arrival:  Comments: 25 yo/ F [facility]

## 2016-08-30 NOTE — Progress Notes (Signed)
Pt arrived to the floor with Dilaudid PCA, settings verified. Pt yelling into phone arguing with someone and crying.

## 2016-08-30 NOTE — H&P (Signed)
History and Physical    Carmen [name] [id number] DOB: 08/09/1991 DOA: 08/30/2016  Referring MD/NP/PA: Dr. Betsey Holiday PCP: [name], MD  Patient coming from: home via EMS  Chief Complaint: Sickle cell crisis  HPI: Carmen [name] is a 25 y.o. female with medical history significant of Hb SS and chronic opoid use ; who presents with complaints of progressively worsening pain all over her body that started in the last 2 days. Associated symptoms include intermittent sharp pains in her chest, productive cough green, runny nose, and cold sweats. Patient tried using her home pain medications of Percocet without significant relief of symptoms. Pain was rated as a 10 out of 10 on the pain scale. She was still waiting for insurance to go through to pick up her extended release morphine. Patient states that the symptoms were aggravated by the recent cold weather and movement. Patient is well-known to our service and was last hospitalization from 10/15-10/23 with pain crisis. Denies any nausea, vomiting, diarrhea, or dysuria.  ED Course: Upon admission into the emergency department patient was seen to be afebrile with all other vitals relatively within normal limits. Lab work revealed WBC 14.8, hemoglobin 9.2, platelets 599, chloride 115, and all other labs were relatively within normal limits. While in the ED the patient was given Toradol, Benadryl, 1/2 normal saline IV fluids at 132ml/hr, 6 mg of Dilaudid IV, but still complaining of pain 10 out of 10. TRH to admit.  Review of Systems: As per HPI otherwise 10 point review of systems negative.  Ketorolac Past Medical History:  Diagnosis Date  . Abortion    05/2012  . Headache(784.0)   . Sickle cell crisis [facility])     Past Surgical History:  Procedure Laterality Date  . CHOLECYSTECTOMY N/A 11/30/2014   Procedure: LAPAROSCOPIC CHOLECYSTECTOMY SINGLE SITE WITH INTRAOPERATIVE CHOLANGIOGRAM;  Surgeon: Michael Boston, MD;  Location: [facility] ORS;  Service:  General;  Laterality: N/A;  . SPLENECTOMY       reports that she has never smoked. She has never used smokeless tobacco. She reports that she does not drink alcohol or use drugs.  No Known Allergies  Family History  Problem Relation Age of Onset  . Hypertension Mother   . Sickle cell anemia Sister   . Kidney disease Sister     Lupus  . Arthritis Sister   . Sickle cell anemia Sister   . Sickle cell trait Sister   . Heart disease Maternal Aunt     CABG  . Heart disease Maternal Uncle     CABG  . Lupus Sister     Prior to Admission medications   Medication Sig Start Date End Date Taking? Authorizing Provider  hydroxyurea (HYDREA) 500 MG capsule Take 500 mg by mouth 2 (two) times daily. 07/14/16   Historical Provider, MD  ibuprofen (ADVIL,MOTRIN) 800 MG tablet Take 1 tablet (800 mg total) by mouth 3 (three) times daily. Patient taking differently: Take 800 mg by mouth 3 (three) times daily as needed for moderate pain.  03/03/16   Delsa Grana, PA-C  metroNIDAZOLE (FLAGYL) 500 MG tablet Take 1 tablet (500 mg total) by mouth every 12 (twelve) hours. 08/23/16   Leana Gamer, MD  morphine (MS CONTIN) 60 MG 12 hr tablet Take 1 tablet (60 mg total) by mouth every 12 (twelve) hours. 05/22/16   Tresa Garter, MD  oxyCODONE-acetaminophen (PERCOCET) 10-325 MG tablet Take 1 tablet by mouth every 6 (six) hours as needed for pain. 05/22/16  Tresa Garter, MD    Physical Exam: Constitutional: Young female in moderate distress unable to get comfortable on the hospital bed. Vitals:   08/30/16 0300 08/30/16 0310 08/30/16 0406 08/30/16 0423  BP:  133/77 120/77 135/96  Pulse:  71 90 82  Resp:  20  20  Temp:  98.7 F (37.1 C)  97.9 F (36.6 C)  TempSrc:  Oral  Oral  SpO2:  96% 98% 98%  Weight: 79.8 kg (176 lb)     Height: 5\' 6"  (1.676 m)      Eyes: PERRL, lids and conjunctivae normal ENMT: Mucous membranes are moist. Posterior pharynx clear of any exudate or lesions.Normal  dentition.  Neck: normal, supple, no masses, no thyromegaly Respiratory: clear to auscultation bilaterally, no wheezing, no crackles. Normal respiratory effort. No accessory muscle use.  Cardiovascular:Mild tachycardia, no murmurs / rubs / gallops. No extremity edema. 2+ pedal pulses. No carotid bruits.  Abdomen: no tenderness, no masses palpated. No hepatosplenomegaly. Bowel sounds positive.  Musculoskeletal: no clubbing / cyanosis. No joint deformity upper and lower extremities. Good ROM, no contractures. Normal muscle tone.  Skin: no rashes, lesions, ulcers. No induration Neurologic: CN 2-12 grossly intact. Sensation intact, DTR normal. Strength 5/5 in all 4.  Psychiatric: Normal judgment and insight. Alert and oriented x 3. Normal mood.     Labs on Admission: I have personally reviewed following labs and imaging studies  CBC:  Recent Labs Lab 08/23/16 0815 08/30/16 0400  WBC 10.2 14.8*  NEUTROABS 4.4 8.4*  HGB 7.2* 9.2*  HCT 20.5* 26.1*  MCV 82.7 82.1  PLT 541* [id number]*   Basic Metabolic Panel:  Recent Labs Lab 08/23/16 0815 08/30/16 0400  NA 137 143  K 3.7 3.6  CL 104 115*  CO2 29 23  GLUCOSE 91 96  BUN <5* 12  CREATININE 0.52 0.63  CALCIUM 9.2 9.4   GFR: Estimated Creatinine Clearance: 116.5 mL/min (by C-G formula based on SCr of 0.63 mg/dL). Liver Function Tests:  Recent Labs Lab 08/30/16 0400  AST 21  ALT 19  ALKPHOS 58  BILITOT 1.4*  PROT 7.5  ALBUMIN 4.1   No results for input(s): LIPASE, AMYLASE in the last 168 hours. No results for input(s): AMMONIA in the last 168 hours. Coagulation Profile: No results for input(s): INR, PROTIME in the last 168 hours. Cardiac Enzymes: No results for input(s): CKTOTAL, CKMB, CKMBINDEX, TROPONINI in the last 168 hours. BNP (last 3 results) No results for input(s): PROBNP in the last 8760 hours. HbA1C: No results for input(s): HGBA1C in the last 72 hours. CBG: No results for input(s): GLUCAP in the last 168  hours. Lipid Profile: No results for input(s): CHOL, HDL, LDLCALC, TRIG, CHOLHDL, LDLDIRECT in the last 72 hours. Thyroid Function Tests: No results for input(s): TSH, T4TOTAL, FREET4, T3FREE, THYROIDAB in the last 72 hours. Anemia Panel:  Recent Labs  08/30/16 0400  RETICCTPCT 4.7*   Urine analysis:    Component Value Date/Time   COLORURINE YELLOW 06/16/2016 [place] 06/16/2016 1445   LABSPEC 1.006 06/16/2016 1445   PHURINE 7.0 06/16/2016 1445   GLUCOSEU NEGATIVE 06/16/2016 1445   HGBUR NEGATIVE 06/16/2016 1445   BILIRUBINUR NEGATIVE 06/16/2016 1445   KETONESUR NEGATIVE 06/16/2016 1445   PROTEINUR NEGATIVE 06/16/2016 1445   UROBILINOGEN 1.0 09/12/2015 1548   NITRITE NEGATIVE 06/16/2016 1445   LEUKOCYTESUR NEGATIVE 06/16/2016 1445   Sepsis Labs: Recent Results (from the past 240 hour(s))  Wet prep, genital     Status:  Abnormal   Collection Time: 08/22/16  8:16 AM  Result Value Ref Range Status   Yeast Wet Prep HPF POC NONE SEEN NONE SEEN Final    Comment: Swab received with less than 0.5 mL of saline, saline added to specimen, interpret results with caution.   Trich, Wet Prep NONE SEEN NONE SEEN Final   Clue Cells Wet Prep HPF POC PRESENT (A) NONE SEEN Final   WBC, Wet Prep HPF POC FEW (A) NONE SEEN Final   Sperm NONE SEEN  Final  Hsv Culture And Typing     Status: None   Collection Time: 08/22/16  8:16 AM  Result Value Ref Range Status   HSV Culture/Type WOODV  Final    Comment: (NOTE) The transport device received for viral culture contained a wooden shaft swab. Wooden shaft swabs are not acceptable because of the inhibitory effect they can have on the recovery of certain organisms.      08/25/2016 Performed At: [facility] [place], [place] [id number] Lindon Romp MD [id number]    Source of Sample VAGINA  Final     Radiological Exams on Admission: No results found.    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 Patient complains of pain all over her body 10 out of 10 on the pain scale not relieved with initial efforts in the ED. Hemoglobin at baseline. She complains of chest pain with productive cough. - Admit to a telemetry bed - Sickle cell crises orderset initiated - Continue hydroxyurea and folate - IV fluids half-normal saline at 125 ml/hour - PCA pump per protocol- full dose Dilaudid - Check troponin 1, ekg, and chest x-ray  Leukocytosis WBC elevated at 14.8 will evaluate for possibility of underlying infection. - Check chest x-ray and urinalysis  Thrombocytosis: Acute on chronic - Continue to monitor  Chronic opioid use  DVT prophylaxis:  Lovenox  Code Status: full Family Communication: No family present at bedside Disposition Plan:  Discharge home when medically stable Consults called: None Admission status: Observation telemetry  Norval Morton MD [facility] Pager [phone]  If 7PM-7AM, please contact night-coverage [email or web address] Password [email or web address]  08/30/2016, 4:48 AM

## 2016-08-30 NOTE — Progress Notes (Addendum)
SICKLE CELL SERVICE PROGRESS NOTE  Carmen [name] [id number] DOB: 1991/01/20 DOA: 08/30/2016 PCP: [name], MD  Assessment/Plan: Principal Problem:   Sickle cell anemia with crisis ([place]) Active Problems:   Leukocytosis   Thrombocytosis ([place])  1. Hb SS with crisis: will change PCA or weight based dose and add scheduled clinician assisted doses. Continue Toradol. 2. Leukocytosis: Pt may have a viral URI, but no other evidence of infection. I suspect that the elevated WBC count is due to crisis and a dehydrated state. 3. Anemia of chronic disease: Hb elevated above baseline. Expect a decrease with hydration.  4. Chronic pain: Pt takes MS Contin 60 mg BID. She ran out of MS Contin and has not been able to obtain this due to an Insurance obstacles. Will resume MS Contin. 5. URI: Pt c/o having chils and a non productive cough. She has been afebrile and has no objective lung findings on clinica examination. She also has no rhinorrhea and her observed cough is minimal and appears to be a forced action.  6. Medication Non-compliance: Pt has refused the Hydrea although she reports that she takes it in the out-patient setting. She offers that she wants to maintain control over how it is prescribed. I did not quite understand what she ment by that thus I asked for further clarification and she said that she was concerned about the monitoring that is required for taking the medication. I explained to the patient that her counts were being monitored appropriately and would continue to be appropriately monitored while she was on Hydrea.    Code Status: Full Code Family Communication: N/A Disposition Plan: Not yet ready for discharge  [place].  Pager [phone]. If 7PM-7AM, please contact night-coverage.  08/30/2016, 1:48 PM  LOS: 0 days   Interim History: Pt reports that she ran out of her MS Contin last Wednesday and has been unable to obtain them. She states that she was trying  to manage with just her short acting but also ran out of them as she had to take them more than she normally does. She also reports that she has been having chills since last Thursday (4 days ago) and was seen by her PMD on that day who advised her to just keep taking er usual medications. She does not report fever, production from the cough or rhinorrhea. She presently reports pain as 10/10 and localized to shoulders, UE's, low back and LE's. Last BM yesterday.  Consultants:  None  Procedures:  None  Antibiotics:  None   Objective: Vitals:   08/30/16 0611 08/30/16 0739 08/30/16 0909 08/30/16 1236  BP: (!) 118/57  116/74   Pulse: 83  84   Resp:  14  15  Temp: 98.4 F (36.9 C)  97.9 F (36.6 C)   TempSrc: Oral  Oral   SpO2: 100% 99% 100% 100%  Weight: 75.3 kg (165 lb 14.4 oz)     Height: 5\' 3"  (1.6 m)      Weight change:   Intake/Output Summary (Last 24 hours) at 08/30/16 1348 Last data filed at 08/30/16 0739  Gross per 24 hour  Intake           295.83 ml  Output              300 ml  Net            -4.17 ml    General: Alert, awake, oriented x3, in no acute distress.  HEENT: [place]/AT PEERL, EOMI, anicteric  Neck: Trachea midline,  no masses, no thyromegal,y no JVD, no carotid bruit OROPHARYNX:  Moist, No exudate/ erythema/lesions.  Heart: Regular rate and rhythm, without murmurs, rubs, gallops, PMI non-displaced, no heaves or thrills on palpation.  Lungs: Clear to auscultation, no wheezing or rhonchi noted. No increased vocal fremitus resonant to percussion  Abdomen: Soft, nontender, nondistended, positive bowel sounds, no masses no hepatosplenomegaly noted.  Neuro: No focal neurological deficits noted cranial nerves II through XII grossly intact. Strength at baseline  in bilateral upper and lower extremities. Musculoskeletal: No warmth swelling or erythema around joints, no spinal tenderness noted. Psychiatric: Patient alert and oriented x3, good insight and cognition,  good recent to remote recall.    Data Reviewed: Basic Metabolic Panel:  Recent Labs Lab 08/30/16 0400  NA 143  K 3.6  CL 115*  CO2 23  GLUCOSE 96  BUN 12  CREATININE 0.63  CALCIUM 9.4   Liver Function Tests:  Recent Labs Lab 08/30/16 0400  AST 21  ALT 19  ALKPHOS 58  BILITOT 1.4*  PROT 7.5  ALBUMIN 4.1   No results for input(s): LIPASE, AMYLASE in the last 168 hours. No results for input(s): AMMONIA in the last 168 hours. CBC:  Recent Labs Lab 08/30/16 0400  WBC 14.8*  NEUTROABS 8.4*  HGB 9.2*  HCT 26.1*  MCV 82.1  PLT 599*   Cardiac Enzymes:  Recent Labs Lab 08/30/16 0400  TROPONINI <0.03   BNP (last 3 results) No results for input(s): BNP in the last 8760 hours.  ProBNP (last 3 results) No results for input(s): PROBNP in the last 8760 hours.  CBG: No results for input(s): GLUCAP in the last 168 hours.  Recent Results (from the past 240 hour(s))  Wet prep, genital     Status: Abnormal   Collection Time: 08/22/16  8:16 AM  Result Value Ref Range Status   Yeast Wet Prep HPF POC NONE SEEN NONE SEEN Final    Comment: Swab received with less than 0.5 mL of saline, saline added to specimen, interpret results with caution.   Trich, Wet Prep NONE SEEN NONE SEEN Final   Clue Cells Wet Prep HPF POC PRESENT (A) NONE SEEN Final   WBC, Wet Prep HPF POC FEW (A) NONE SEEN Final   Sperm NONE SEEN  Final  Hsv Culture And Typing     Status: None   Collection Time: 08/22/16  8:16 AM  Result Value Ref Range Status   HSV Culture/Type WOODV  Final    Comment: (NOTE) The transport device received for viral culture contained a wooden shaft swab. Wooden shaft swabs are not acceptable because of the inhibitory effect they can have on the recovery of certain organisms.      08/25/2016 Performed At: [facility] [place], [place] [id number] Lindon Romp MD [id number]    Source of Sample VAGINA  Final     Studies: Dg Chest 2  View  Result Date: 08/15/2016 CLINICAL DATA:  Chest pain for 3 days. EXAM: CHEST  2 VIEW COMPARISON:  None. FINDINGS: The cardiomediastinal silhouette is unremarkable. There is no evidence of focal airspace disease, pulmonary edema, suspicious pulmonary nodule/mass, pleural effusion, or pneumothorax. No acute bony abnormalities are identified. IMPRESSION: No active cardiopulmonary disease. Electronically Signed   By: Margarette Canada M.D.   On: 08/15/2016 18:55    Scheduled Meds: . enoxaparin (LOVENOX) injection  40 mg Subcutaneous Q24H  . folic acid  1 mg Oral Daily  .  HYDROmorphone   Intravenous Q4H  . hydroxyurea  500 mg Oral BID  . ketorolac  30 mg Intravenous Q6H  . senna-docusate  1 tablet Oral BID   Continuous Infusions: . sodium chloride 125 mL/hr at 08/30/16 1240    Principal Problem:   Sickle cell anemia with crisis ([place]) Active Problems:   Leukocytosis   Thrombocytosis ([facility])   In excess of 30 minutes spent during this visit. Greater than 50% involved face to face contact with the patient for assessment, counseling and coordination of care.

## 2016-08-30 NOTE — ED Triage Notes (Signed)
Sickle cell pain for 2 days taking percocet prescribed for pain now its now working for her pain.

## 2016-08-30 NOTE — Care Management Note (Signed)
Case Management Note  Patient Details  Name: Carmen [name] MRN: [id number] Date of Birth: 11/29/1990  Subjective/Objective:25 y/o f admitted w/SSC. From home.                    Action/Plan:d/c plan home.  Expected Discharge Date:                  Expected Discharge Plan:  Home/Self Care  In-House Referral:     Discharge planning Services  CM Consult  Post Acute Care Choice:    Choice offered to:     DME Arranged:    DME Agency:     HH Arranged:    HH Agency:     Status of Service:  In process, will continue to follow  If discussed at Long Length of Stay Meetings, dates discussed:    Additional Comments:  Dessa Phi, RN 08/30/2016, 10:57 AM

## 2016-08-30 NOTE — ED Provider Notes (Signed)
[facility] DEPT Provider Note   CSN: [id number] Arrival date & time: 08/30/16  [id number]  By signing my name below, I, Higinio Plan, attest that this documentation has been prepared under the direction and in the presence of Orpah Greek, MD . Electronically Signed: Higinio Plan, Scribe. 08/30/2016. 3:06 AM.  History   Chief Complaint Chief Complaint  Patient presents with  . Sickle Cell Pain Crisis   The history is provided by the patient. No language interpreter was used.   HPI Comments: METRA MOST is a 25 y.o. female with PMHx of sickle cell crisis, brought in by EMS to the Emergency Department complaining of gradually worsening, back pain consistent with her sickle cell anemia that began 2 days ago. Pt reports she has been using her prescribed percocet that she normally takes for her sickle cell pain; however, it stopped working today. She denies fever.   Past Medical History:  Diagnosis Date  . Abortion    05/2012  . Headache(784.0)   . Sickle cell crisis [facility])     Patient Active Problem List   Diagnosis Date Noted  . Skin abrasion   . Constipation   . Sickle cell anemia ([place]) 08/15/2016  . Breast lump 08/15/2016  . Anemia of chronic disease   . Acute URI 02/10/2016  . Sickle-cell crisis ([place]) 12/19/2015  . Sickle cell anemia with crisis ([place]) 12/19/2015  . Chest pain 11/10/2015  . 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 Sickle cell disease with crisis ([place]) 09/09/2015  . Dental caries   . Pain in a tooth or teeth   . Right facial swelling   . HCAP (healthcare-associated pneumonia) 02/28/2015  . Sickle cell crisis ([place]) 02/28/2015  . Sickle cell anemia with pain ([place]) 02/16/2015  . 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 Acute calculous cholecystitis s/p lap chole 11/30/2014 11/30/2014  . Hypokalemia 11/28/2014  . Atypical chest pain 11/28/2014  . Leukocytosis 11/22/2014  . Thrombocytosis ([place]) 11/22/2014  . Anemia  05/16/2014  . Migraine, unspecified, without mention of intractable migraine without mention of status migrainosus 05/16/2014  . Sickle cell pain crisis ([place]) 11/12/2013    Past Surgical History:  Procedure Laterality Date  . CHOLECYSTECTOMY N/A 11/30/2014   Procedure: LAPAROSCOPIC CHOLECYSTECTOMY SINGLE SITE WITH INTRAOPERATIVE CHOLANGIOGRAM;  Surgeon: Michael Boston, MD;  Location: [facility] ORS;  Service: General;  Laterality: N/A;  . SPLENECTOMY      OB History    Gravida Para Term Preterm AB Living   1       1     SAB TAB Ectopic Multiple Live Births                 Home Medications    Prior to Admission medications   Medication Sig Start Date End Date Taking? Authorizing Provider  hydroxyurea (HYDREA) 500 MG capsule Take 500 mg by mouth 2 (two) times daily. 07/14/16   Historical Provider, MD  ibuprofen (ADVIL,MOTRIN) 800 MG tablet Take 1 tablet (800 mg total) by mouth 3 (three) times daily. Patient taking differently: Take 800 mg by mouth 3 (three) times daily as needed for moderate pain.  03/03/16   Delsa Grana, PA-C  metroNIDAZOLE (FLAGYL) 500 MG tablet Take 1 tablet (500 mg total) by mouth every 12 (twelve) hours. 08/23/16   Leana Gamer, MD  morphine (MS CONTIN) 60 MG 12 hr tablet Take 1 tablet (60 mg total) by mouth every 12 (twelve) hours. 05/22/16   Olugbemiga  Essie Christine, MD  oxyCODONE-acetaminophen (PERCOCET) 10-325 MG tablet Take 1 tablet by mouth every 6 (six) hours as needed for pain. 05/22/16   Tresa Garter, MD    Family History Family History  Problem Relation Age of Onset  . Hypertension Mother   . Sickle cell anemia Sister   . Kidney disease Sister     Lupus  . Arthritis Sister   . Sickle cell anemia Sister   . Sickle cell trait Sister   . Heart disease Maternal Aunt     CABG  . Heart disease Maternal Uncle     CABG  . Lupus Sister     Social History Social History  Substance Use Topics  . Smoking status: Never Smoker  . Smokeless tobacco: Never  Used  . Alcohol use No     Allergies   Review of patient's allergies indicates no known allergies.   Review of Systems Review of Systems  Constitutional: Negative for fever.  Musculoskeletal: Positive for back pain.   Physical Exam Updated Vital Signs BP 135/96 (BP Location: Right Arm)   Pulse 82   Temp 97.9 F (36.6 C) (Oral)   Resp 20   Ht 5\' 6"  (1.676 m)   Wt 176 lb (79.8 kg)   LMP 08/06/2016   SpO2 98%   BMI 28.41 kg/m   Physical Exam  Constitutional: She is oriented to person, place, and time. She appears well-developed and well-nourished. No distress.  HENT:  Head: Normocephalic and atraumatic.  Right Ear: Hearing normal.  Left Ear: Hearing normal.  Nose: Nose normal.  Mouth/Throat: Oropharynx is clear and moist and mucous membranes are normal.  Eyes: Conjunctivae and EOM are normal. Pupils are equal, round, and reactive to light.  Neck: Normal range of motion. Neck supple.  Cardiovascular: Regular rhythm, S1 normal and S2 normal.  Exam reveals no gallop and no friction rub.   No murmur heard. Pulmonary/Chest: Effort normal and breath sounds normal. No respiratory distress. She exhibits no tenderness.  Abdominal: Soft. Normal appearance and bowel sounds are normal. There is no hepatosplenomegaly. There is no tenderness. There is no rebound, no guarding, no tenderness at McBurney's point and negative Murphy's sign. No hernia.  Musculoskeletal: Normal range of motion.  Neurological: She is alert and oriented to person, place, and time. She has normal strength. No cranial nerve deficit or sensory deficit. Coordination normal. GCS eye subscore is 4. GCS verbal subscore is 5. GCS motor subscore is 6.  Skin: Skin is warm, dry and intact. No rash noted. No cyanosis.  Psychiatric: She has a normal mood and affect. Her speech is normal and behavior is normal. Thought content normal.  Nursing note and vitals reviewed.  ED Treatments / Results  Labs (all labs ordered are  listed, but only abnormal results are displayed) Labs Reviewed  RETICULOCYTES - Abnormal; Notable for the following:       Result Value   Retic Ct Pct 4.7 (*)    RBC. 3.18 (*)    All other components within normal limits  CBC WITH DIFFERENTIAL/PLATELET - Abnormal; Notable for the following:    WBC 14.8 (*)    RBC 3.18 (*)    Hemoglobin 9.2 (*)    HCT 26.1 (*)    Platelets 599 (*)    Neutro Abs 8.4 (*)    Lymphs Abs 4.5 (*)    Monocytes Absolute 1.8 (*)    All other components within normal limits  COMPREHENSIVE METABOLIC PANEL    EKG  EKG Interpretation None       Radiology No results found.  Procedures Procedures (including critical care time)  Medications Ordered in ED Medications  0.45 % sodium chloride infusion ( Intravenous New Bag/Given 08/30/16 0358)  HYDROmorphone (DILAUDID) injection 2 mg (not administered)    Or  HYDROmorphone (DILAUDID) injection 2 mg (not administered)  HYDROmorphone (DILAUDID) injection 2 mg (not administered)    Or  HYDROmorphone (DILAUDID) injection 2 mg (not administered)  promethazine (PHENERGAN) tablet 25 mg (not administered)  ketorolac (TORADOL) 30 MG/ML injection 30 mg (30 mg Intravenous Given 08/30/16 0357)  HYDROmorphone (DILAUDID) injection 2 mg (2 mg Intravenous Given 08/30/16 0357)    Or  HYDROmorphone (DILAUDID) injection 2 mg ( Subcutaneous See Alternative 08/30/16 0357)  HYDROmorphone (DILAUDID) injection 2 mg (2 mg Intravenous Given 08/30/16 0426)    Or  HYDROmorphone (DILAUDID) injection 2 mg ( Subcutaneous See Alternative 08/30/16 0426)  diphenhydrAMINE (BENADRYL) injection 25 mg (25 mg Intravenous Given 08/30/16 0358)    DIAGNOSTIC STUDIES:  Oxygen Saturation is 98% on room air, normal by my interpretation.    COORDINATION OF CARE:  3:05 AM Discussed treatment plan with pt at bedside and pt agreed to plan.  Initial Impression / Assessment and Plan / ED Course  I have reviewed the triage vital signs and the  nursing notes.  Pertinent labs & imaging results that were available during my care of the patient were reviewed by me and considered in my medical decision making (see chart for details).  Clinical Course   Patient with history of sickle cell disease presents to the emergency department with complaints of severe sickle cell crisis. Patient complaining of pain "all over. She specifically is experiencing pain in all of her extremities and her lower back. This is typical of previous sickle cell crisis. There is nothing to indicate acute chest syndrome. She is not expressing chest pain, cough, shortness of breath or hypoxia. Lab work is appropriate. Patient has been given multiple doses of analgesia with no improvement. Patient to be admitted to the hospital for further management.  I personally performed the services described in this documentation, which was scribed in my presence. The recorded information has been reviewed and is accurate.   Final Clinical Impressions(s) / ED Diagnoses   Final diagnoses:  Sickle cell pain crisis [facility])    New Prescriptions New Prescriptions   No medications on file     Orpah Greek, MD 08/30/16 [phone]

## 2016-08-31 ENCOUNTER — Inpatient Hospital Stay (HOSPITAL_COMMUNITY): Payer: Medicaid Other

## 2016-08-31 LAB — CBC WITH DIFFERENTIAL/PLATELET
Basophils Absolute: 0 10*3/uL (ref 0.0–0.1)
Basophils Relative: 0 %
EOS PCT: 3 %
Eosinophils Absolute: 0.3 10*3/uL (ref 0.0–0.7)
HEMATOCRIT: 22.5 % — AB (ref 36.0–46.0)
HEMOGLOBIN: 8.2 g/dL — AB (ref 12.0–15.0)
LYMPHS ABS: 5.3 10*3/uL — AB (ref 0.7–4.0)
LYMPHS PCT: 47 %
MCH: 29.4 pg (ref 26.0–34.0)
MCHC: 36.4 g/dL — ABNORMAL HIGH (ref 30.0–36.0)
MCV: 80.6 fL (ref 78.0–100.0)
Monocytes Absolute: 1.2 10*3/uL — ABNORMAL HIGH (ref 0.1–1.0)
Monocytes Relative: 11 %
NEUTROS ABS: 4.4 10*3/uL (ref 1.7–7.7)
Neutrophils Relative %: 39 %
PLATELETS: 559 10*3/uL — AB (ref 150–400)
RBC: 2.79 MIL/uL — AB (ref 3.87–5.11)
RDW: 16.4 % — ABNORMAL HIGH (ref 11.5–15.5)
WBC: 11.2 10*3/uL — AB (ref 4.0–10.5)

## 2016-08-31 LAB — BASIC METABOLIC PANEL
ANION GAP: 5 (ref 5–15)
BUN: 9 mg/dL (ref 6–20)
CHLORIDE: 109 mmol/L (ref 101–111)
CO2: 23 mmol/L (ref 22–32)
Calcium: 9 mg/dL (ref 8.9–10.3)
Creatinine, Ser: 0.46 mg/dL (ref 0.44–1.00)
GFR calc Af Amer: >60 mL/min (ref >60)
GLUCOSE: 113 mg/dL — AB (ref 65–99)
POTASSIUM: 3.4 mmol/L — AB (ref 3.5–5.1)
Sodium: 137 mmol/L (ref 135–145)
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
## 2016-09-01 ENCOUNTER — Other Ambulatory Visit: Payer: Medicaid Other

## 2016-09-01 DIAGNOSIS — K5900 Constipation, unspecified: Secondary | ICD-10-CM

## 2016-09-01 LAB — HEPATIC FUNCTION PANEL
ALK PHOS: 49 U/L (ref 38–126)
ALT: 27 U/L (ref 14–54)
AST: 28 U/L (ref 15–41)
Albumin: 4.2 g/dL (ref 3.5–5.0)
BILIRUBIN DIRECT: 0.2 mg/dL (ref 0.1–0.5)
Indirect Bilirubin: 2.9 mg/dL — ABNORMAL HIGH (ref 0.3–0.9)
Total Bilirubin: 3.1 mg/dL — ABNORMAL HIGH (ref 0.3–1.2)
Total Protein: 7.6 g/dL (ref 6.5–8.1)

## 2016-09-01 LAB — BASIC METABOLIC PANEL
ANION GAP: 7 (ref 5–15)
BUN: 7 mg/dL (ref 6–20)
CHLORIDE: 106 mmol/L (ref 101–111)
CO2: 26 mmol/L (ref 22–32)
Calcium: 9.4 mg/dL (ref 8.9–10.3)
Creatinine, Ser: 0.39 mg/dL — ABNORMAL LOW (ref 0.44–1.00)
GFR calc Af Amer: >60 mL/min (ref >60)
GLUCOSE: 88 mg/dL (ref 65–99)
POTASSIUM: 3.6 mmol/L (ref 3.5–5.1)
Sodium: 139 mmol/L (ref 135–145)

## 2016-09-01 LAB — URINALYSIS, ROUTINE W REFLEX MICROSCOPIC
Bilirubin Urine: NEGATIVE
GLUCOSE, UA: NEGATIVE mg/dL
KETONES UR: 15 mg/dL — AB
LEUKOCYTES UA: NEGATIVE
Nitrite: NEGATIVE
Protein, ur: NEGATIVE mg/dL
Specific Gravity, Urine: 1.01 (ref 1.005–1.030)
pH: 6 (ref 5.0–8.0)

## 2016-09-01 LAB — URINE MICROSCOPIC-ADD ON
BACTERIA UA: NONE SEEN
RBC / HPF: NONE SEEN RBC/hpf (ref 0–5)

## 2016-09-01 MED ORDER — HYDROMORPHONE HCL 1 MG/ML IJ SOLN
1.0000 mg | INTRAMUSCULAR | Status: AC
  Administered 2016-09-01 – 2016-09-04 (×30): 1 mg via INTRAVENOUS
  Filled 2016-09-01 (×31): qty 1

## 2016-09-01 MED ORDER — MAGNESIUM CITRATE PO SOLN
1.0000 | Freq: Once | ORAL | Status: AC
  Administered 2016-09-01: 1 via ORAL
  Filled 2016-09-01 (×2): qty 296
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
## 2016-09-02 DIAGNOSIS — R1110 Vomiting, unspecified: Secondary | ICD-10-CM

## 2016-09-02 NOTE — Progress Notes (Signed)
SICKLE CELL SERVICE PROGRESS NOTE  ADANYA KITTRELL [id number] DOB: [birth year] DOA: 08/30/2016 PCP: [name], MD  Assessment/Plan: Principal Problem:   Sickle cell anemia with crisis ([place]) Active Problems:   Sickle cell pain crisis ([place])   Leukocytosis   Thrombocytosis ([place])  1. Hb SS with crisis: Contiue PCA at weight based dose, scheduled clinician doses and Toradol   2. Emesis: Pt  Has tolerated full liquid and has not had any emesis since yesterday. Will advance to a regular diet and encouraged patient to choose bland diet for at least the next couple of meals until tolerating without emesis. 3. Leukocytosis: Pt has no clinical signs of infection. Will re-check CBC tomorrow.  4. Chronic pain: Pt takes MS Contin 60 mg BID. She ran out of MS Contin and has not been able to obtain this due to an Insurance obstacles. Continue MS Contin.  5. Medication Non-compliance: Pt complied with use of Hydrea since our discussion yesterday. 6. Constipation: Pt reports small BM yesterday.  7. Psychosocial: Pt without any social issues overnight.   Code Status: Full Code Family Communication: N/A Disposition Plan: Not yet ready for discharge  [place].  Pager [phone]. If 7PM-7AM, please contact night-coverage.  09/02/2016, 2:20 PM  LOS: 3 days   Interim History: Pt reports that she still is unable to obtain MS Contin but is working with Case Freight forwarder from [employer] to obtain her medication. She has had 2 episodes of emesis this morning both associated wit heating. She states that she is able tolerate liquids without emesis. Pt rates pain 9/10 and localized to arms, legs and back. She has used 32.4 mg with 56/54: demands/deliveries in the last 24 hours with an additional 6 mg in clinician assisted doses . Pt had a BM last night.  Consultants:  None  Procedures:  None  Antibiotics:  None   Objective: Vitals:   09/02/16 0442 09/02/16 0500 09/02/16  0729 09/02/16 1219  BP:  115/75    Pulse:  88    Resp: 14 14 17 13   Temp:  98 F (36.7 C)    TempSrc:  Oral    SpO2: 95% 100% 93% 99%  Weight:      Height:       Weight change:   Intake/Output Summary (Last 24 hours) at 09/02/16 1420 Last data filed at 09/02/16 0600  Gross per 24 hour  Intake             1615 ml  Output             1650 ml  Net              -35 ml    General: Alert, awake, oriented x3, in mild distress.  HEENT: [place]/AT PEERL, EOMI, anicteric Neck: Trachea midline,  no masses, no thyromegal,y no JVD, no carotid bruit OROPHARYNX:  Moist, No exudate/ erythema/lesions.  Heart: Regular rate and rhythm, without murmurs, rubs, gallops, PMI non-displaced, no heaves or thrills on palpation.  Lungs: Clear to auscultation, no wheezing or rhonchi noted. No increased vocal fremitus resonant to percussion  Abdomen: Soft, nontender, nondistended, positive bowel sounds, no masses no hepatosplenomegaly noted.  Neuro: No focal neurological deficits noted cranial nerves II through XII grossly intact. Strength at baseline  in bilateral upper and lower extremities. Musculoskeletal: No warmth swelling or erythema around joints, no spinal tenderness noted. Psychiatric: Patient alert and oriented x3.   Data Reviewed: Basic Metabolic Panel:  Recent Labs Lab 08/30/16 0400  08/31/16 0422 09/01/16 0909  NA 143 137 139  K 3.6 3.4* 3.6  CL 115* 109 106  CO2 23 23 26   GLUCOSE 96 113* 88  BUN 12 9 7   CREATININE 0.63 0.46 0.39*  CALCIUM 9.4 9.0 9.4   Liver Function Tests:  Recent Labs Lab 08/30/16 0400 09/01/16 0908  AST 21 28  ALT 19 27  ALKPHOS 58 49  BILITOT 1.4* 3.1*  PROT 7.5 7.6  ALBUMIN 4.1 4.2   No results for input(s): LIPASE, AMYLASE in the last 168 hours. No results for input(s): AMMONIA in the last 168 hours. CBC:  Recent Labs Lab 08/30/16 0400 08/31/16 0422  WBC 14.8* 11.2*  NEUTROABS 8.4* 4.4  HGB 9.2* 8.2*  HCT 26.1* 22.5*  MCV 82.1 80.6  PLT  599* 559*   Cardiac Enzymes:  Recent Labs Lab 08/30/16 0400  TROPONINI <0.03   BNP (last 3 results) No results for input(s): BNP in the last 8760 hours.  ProBNP (last 3 results) No results for input(s): PROBNP in the last 8760 hours.  CBG: No results for input(s): GLUCAP in the last 168 hours.  No results found for this or any previous visit (from the past 240 hour(s)).   Studies: Dg Chest 2 View  Result Date: 08/31/2016 CLINICAL DATA:  Sickle cell pain crisis for 3 days. EXAM: CHEST  2 VIEW COMPARISON:  08/15/2016 FINDINGS: The heart size and mediastinal contours are within normal limits. Mild scarring again noted in right lung base. No evidence of pulmonary infiltrate or edema. No evidence of pleural effusion or pneumothorax. IMPRESSION: No active cardiopulmonary disease. Electronically Signed   By: Earle Gell M.D.   On: 08/31/2016 09:36   Dg Chest 2 View  Result Date: 08/15/2016 CLINICAL DATA:  Chest pain for 3 days. EXAM: CHEST  2 VIEW COMPARISON:  None. FINDINGS: The cardiomediastinal silhouette is unremarkable. There is no evidence of focal airspace disease, pulmonary edema, suspicious pulmonary nodule/mass, pleural effusion, or pneumothorax. No acute bony abnormalities are identified. IMPRESSION: No active cardiopulmonary disease. Electronically Signed   By: Margarette Canada M.D.   On: 08/15/2016 18:55    Scheduled Meds: . enoxaparin (LOVENOX) injection  40 mg Subcutaneous Q24H  . folic acid  1 mg Oral Daily  . HYDROmorphone   Intravenous Q4H  .  HYDROmorphone (DILAUDID) injection  1 mg Intravenous Q2H  . hydroxyurea  500 mg Oral BID  . ketorolac  30 mg Intravenous Q6H  . morphine  60 mg Oral Q12H  . senna-docusate  1 tablet Oral BID   Continuous Infusions: . sodium chloride 125 mL/hr at 09/02/16 1021    Principal Problem:   Sickle cell anemia with crisis [facility]) Active Problems:   Sickle cell pain crisis (HCC)   Leukocytosis   Thrombocytosis (HCC)   In excess  of 25 minutes spent during this visit. Greater than 50% involved face to face contact with the patient for assessment, counseling and coordination of care.

## 2016-09-03 LAB — CBC WITH DIFFERENTIAL/PLATELET
Basophils Absolute: 0 10*3/uL (ref 0.0–0.1)
Basophils Relative: 0 %
EOS ABS: 0.3 10*3/uL (ref 0.0–0.7)
EOS PCT: 3 %
HCT: 19.3 % — ABNORMAL LOW (ref 36.0–46.0)
HEMOGLOBIN: 7 g/dL — AB (ref 12.0–15.0)
LYMPHS PCT: 39 %
Lymphs Abs: 4.1 10*3/uL — ABNORMAL HIGH (ref 0.7–4.0)
MCH: 29.7 pg (ref 26.0–34.0)
MCHC: 36.3 g/dL — ABNORMAL HIGH (ref 30.0–36.0)
MCV: 81.8 fL (ref 78.0–100.0)
MONOS PCT: 13 %
Monocytes Absolute: 1.4 10*3/uL — ABNORMAL HIGH (ref 0.1–1.0)
NEUTROS ABS: 4.8 10*3/uL (ref 1.7–7.7)
NRBC: 9 /100{WBCs} — AB
Neutrophils Relative %: 45 %
PLATELETS: 635 10*3/uL — AB (ref 150–400)
RBC: 2.36 MIL/uL — AB (ref 3.87–5.11)
RDW: 19 % — ABNORMAL HIGH (ref 11.5–15.5)
WBC: 10.6 10*3/uL — AB (ref 4.0–10.5)

## 2016-09-03 LAB — BASIC METABOLIC PANEL
Anion gap: 5 (ref 5–15)
CALCIUM: 8.8 mg/dL — AB (ref 8.9–10.3)
CHLORIDE: 103 mmol/L (ref 101–111)
CO2: 29 mmol/L (ref 22–32)
CREATININE: 0.43 mg/dL — AB (ref 0.44–1.00)
Glucose, Bld: 100 mg/dL — ABNORMAL HIGH (ref 65–99)
Potassium: 3.1 mmol/L — ABNORMAL LOW (ref 3.5–5.1)
SODIUM: 137 mmol/L (ref 135–145)

## 2016-09-03 LAB — RETICULOCYTES
RBC.: 2.36 MIL/uL — ABNORMAL LOW (ref 3.87–5.11)
RETIC COUNT ABSOLUTE: 313.9 10*3/uL — AB (ref 19.0–186.0)
RETIC CT PCT: 13.3 % — AB (ref 0.4–3.1)

## 2016-09-03 MED ORDER — HYDROXYUREA 500 MG PO CAPS
1000.0000 mg | ORAL_CAPSULE | Freq: Every day | ORAL | Status: DC
  Administered 2016-09-04 – 2016-09-05 (×2): 1000 mg via ORAL
  Filled 2016-09-03 (×2): qty 2

## 2016-09-03 MED ORDER — OXYCODONE HCL 5 MG PO TABS
5.0000 mg | ORAL_TABLET | ORAL | Status: DC
  Administered 2016-09-04 – 2016-09-05 (×7): 5 mg via ORAL
  Filled 2016-09-03 (×7): qty 1

## 2016-09-03 MED ORDER — OXYCODONE-ACETAMINOPHEN 5-325 MG PO TABS
1.0000 | ORAL_TABLET | ORAL | Status: DC
  Administered 2016-09-04 – 2016-09-05 (×7): 1 via ORAL
  Filled 2016-09-03 (×7): qty 1

## 2016-09-03 NOTE — Progress Notes (Signed)
SICKLE CELL SERVICE PROGRESS NOTE  Carmen [name] [id number] DOB: [birth year] DOA: 08/30/2016 PCP: [name], MD  Assessment/Plan: Principal Problem:   Sickle cell anemia with crisis ([place]) Active Problems:   Sickle cell pain crisis ([place])   Leukocytosis   Thrombocytosis ([place])  1. Hb SS with crisis: Contiue current regimen until tomorrow when I will schedule Percocet, discontinue clinician assisted doses and decrease PCA bolus dose to 0.5 mg starting at 8:00 am tomorrow (11/4).  2. Emesis: Pt  Has tolerated full liquid and has not had any emesis since yesterday. Will advance to a regular diet and encouraged patient to choose bland diet for at least the next couple of meals until tolerating without emesis. Decrease IVF to 50 ml/hr 3. Leukocytosis: Pt has no clinical signs of infection. Labs pending for today. 4. Chronic pain: Pt takes MS Contin 60 mg BID. She ran out of MS Contin and has not been able to obtain this due to an Insurance obstacles. Continue MS Contin.  5. Medication Non-compliance: Pt complied with use of Hydrea since our discussion yesterday. Labs pending for today. 6. Constipation: Pt reports  BM yesterday.  7. Psychosocial: Pt without any social issues overnight.   Code Status: Full Code Family Communication: N/A Disposition Plan: Not yet ready for discharge  [place].  Pager [phone]. If 7PM-7AM, please contact night-coverage.  09/03/2016, 2:58 PM  LOS: 4 days   Interim History: Pt reports that she still is unable to obtain MS Contin but is working with Case Freight forwarder from [employer] to obtain her medication. She has had 2 episodes of emesis this morning both associated wit heating. She states that she is able tolerate liquids without emesis. Pt rates pain 7/10 and localized to legs and back. She has used 41.9 mg with 72/79: demands/deliveries in the last 24 hours with an additional 12 mg in clinician assisted doses . Pt had a BM last  night.  Consultants:  None  Procedures:  None  Antibiotics:  None   Objective: Vitals:   09/03/16 0600 09/03/16 0835 09/03/16 1000 09/03/16 1243  BP: 120/79  114/70   Pulse: 92  77   Resp: 11 15 12 10   Temp: 98.3 F (36.8 C)  98.4 F (36.9 C)   TempSrc: Oral  Oral   SpO2: 97% 100% 97% 97%  Weight:      Height:       Weight change:   Intake/Output Summary (Last 24 hours) at 09/03/16 1458 Last data filed at 09/03/16 1200  Gross per 24 hour  Intake              240 ml  Output             1700 ml  Net            -1460 ml    General: Alert, awake, oriented x3, in no apparent distress.  HEENT: [place]/AT PEERL, EOMI, mild icterus. Heart: Regular rate and rhythm, without murmurs, rubs, gallops, PMI non-displaced, no heaves or thrills on palpation.  Lungs: Clear to auscultation, no wheezing or rhonchi noted. No increased vocal fremitus resonant to percussion  Abdomen: Soft, nontender, nondistended, positive bowel sounds, no masses no hepatosplenomegaly noted.  Neuro: No focal neurological deficits noted cranial nerves II through XII grossly intact. Strength at baseline  in bilateral upper and lower extremities. Musculoskeletal: No warmth swelling or erythema around joints, no spinal tenderness noted. Psychiatric: Patient alert and oriented x3.   Data Reviewed: Basic Metabolic  Panel:  Recent Labs Lab 08/30/16 0400 08/31/16 0422 09/01/16 0909  NA 143 137 139  K 3.6 3.4* 3.6  CL 115* 109 106  CO2 23 23 26   GLUCOSE 96 113* 88  BUN 12 9 7   CREATININE 0.63 0.46 0.39*  CALCIUM 9.4 9.0 9.4   Liver Function Tests:  Recent Labs Lab 08/30/16 0400 09/01/16 0908  AST 21 28  ALT 19 27  ALKPHOS 58 49  BILITOT 1.4* 3.1*  PROT 7.5 7.6  ALBUMIN 4.1 4.2   No results for input(s): LIPASE, AMYLASE in the last 168 hours. No results for input(s): AMMONIA in the last 168 hours. CBC:  Recent Labs Lab 08/30/16 0400 08/31/16 0422  WBC 14.8* 11.2*  NEUTROABS 8.4* 4.4   HGB 9.2* 8.2*  HCT 26.1* 22.5*  MCV 82.1 80.6  PLT 599* 559*   Cardiac Enzymes:  Recent Labs Lab 08/30/16 0400  TROPONINI <0.03   BNP (last 3 results) No results for input(s): BNP in the last 8760 hours.  ProBNP (last 3 results) No results for input(s): PROBNP in the last 8760 hours.  CBG: No results for input(s): GLUCAP in the last 168 hours.  No results found for this or any previous visit (from the past 240 hour(s)).   Studies: Dg Chest 2 View  Result Date: 08/31/2016 CLINICAL DATA:  Sickle cell pain crisis for 3 days. EXAM: CHEST  2 VIEW COMPARISON:  08/15/2016 FINDINGS: The heart size and mediastinal contours are within normal limits. Mild scarring again noted in right lung base. No evidence of pulmonary infiltrate or edema. No evidence of pleural effusion or pneumothorax. IMPRESSION: No active cardiopulmonary disease. Electronically Signed   By: Earle Gell M.D.   On: 08/31/2016 09:36   Dg Chest 2 View  Result Date: 08/15/2016 CLINICAL DATA:  Chest pain for 3 days. EXAM: CHEST  2 VIEW COMPARISON:  None. FINDINGS: The cardiomediastinal silhouette is unremarkable. There is no evidence of focal airspace disease, pulmonary edema, suspicious pulmonary nodule/mass, pleural effusion, or pneumothorax. No acute bony abnormalities are identified. IMPRESSION: No active cardiopulmonary disease. Electronically Signed   By: Margarette Canada M.D.   On: 08/15/2016 18:55    Scheduled Meds: . enoxaparin (LOVENOX) injection  40 mg Subcutaneous Q24H  . folic acid  1 mg Oral Daily  . HYDROmorphone   Intravenous Q4H  .  HYDROmorphone (DILAUDID) injection  1 mg Intravenous Q2H  . [START ON 09/04/2016] hydroxyurea  1,000 mg Oral Daily  . ketorolac  30 mg Intravenous Q6H  . morphine  60 mg Oral Q12H  . [START ON 09/04/2016] oxyCODONE-acetaminophen  1 tablet Oral Q4H   And  . [START ON 09/04/2016] oxyCODONE  5 mg Oral Q4H  . senna-docusate  1 tablet Oral BID   Continuous Infusions: . sodium  chloride 125 mL/hr at 09/03/16 1400    Principal Problem:   Sickle cell anemia with crisis [facility]) Active Problems:   Sickle cell pain crisis (HCC)   Leukocytosis   Thrombocytosis (H[facility])   In excess of 25 minutes spent during this visit. Greater than 50% involved face to face contact with the patient for assessment, counseling and coordination of care.

## 2016-09-04 MED ORDER — HYDROMORPHONE 1 MG/ML IV SOLN
INTRAVENOUS | Status: DC
  Administered 2016-09-04: 9.6 mg via INTRAVENOUS
  Administered 2016-09-04: 1.8 mg via INTRAVENOUS
  Administered 2016-09-04: 7.2 mg via INTRAVENOUS
  Administered 2016-09-04: 2.4 mg via INTRAVENOUS
  Administered 2016-09-05: 01:00:00 via INTRAVENOUS
  Administered 2016-09-05: 10.55 mg via INTRAVENOUS
  Administered 2016-09-05: 2.5 mg via INTRAVENOUS
  Filled 2016-09-04: qty 25

## 2016-09-04 NOTE — Progress Notes (Signed)
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 Hb SS with crisis: Patient's PCA bolus doses would be titrated down. The physician assistant dose has been discontinued. 2. Emesis: this is resolved. 3. Leukocytosis: Pt has no clinical signs of infection. Labs pending for today. 4. Chronic pain: Pt takes MS Contin 60 mg BID. She ran out of MS Contin and has not been able to obtain this due to an Insurance obstacles. Continue MS Contin. Counseled patient on getting and taking her medications. 5. Medication Non-compliance: Pt complied with use of Hydrea since our discussion yesterday. Labs pending for today. 6. Constipation: Resolved.  7. Psychosocial: Pt without any social issues overnight.   Code Status: Full Code Family Communication: N/A Disposition Plan: Not yet ready for discharge  [facility]  Pager [phone]. If 7PM-7AM, please contact night-coverage.  09/04/2016, 11:30 AM  LOS: 5 days   Interim History: Pt reports that she still is unable to obtain MS Contin but is working with Case Freight forwarder from [employer] to obtain her medication. She has had 2 episodes of emesis this morning both associated wit heating. She states that she is able tolerate liquids without emesis. Pt rates pain 7/10 and localized to legs and back. She has used 41.9 mg with 72/79: demands/deliveries in the last 24 hours with an additional 12 mg in clinician assisted doses . Pt had a BM last night.  Consultants:  None  Procedures:  None  Antibiotics:  None   Objective: Vitals:   09/04/16 0442 09/04/16 0522 09/04/16 0736 09/04/16 1029  BP:  124/75  (!) 144/83  Pulse:  76  65  Resp: 10 12 11 13   Temp:  98.3 F (36.8 C)  97.5 F (36.4 C)   TempSrc:  Oral  Oral  SpO2: 99% 99% 94% 100%  Weight:  75.6 kg (166 lb 10.7 oz)    Height:       Weight change:   Intake/Output Summary (Last 24 hours) at 09/04/16 1130 Last data filed at 09/04/16 0400  Gross per 24 hour  Intake          9162.55 ml  Output             1500 ml  Net          7662.55 ml    General: Alert, awake, oriented x3, in no apparent distress.  HEENT: [place]/AT PEERL, EOMI, mild icterus. Heart: Regular rate and rhythm, without murmurs, rubs, gallops, PMI non-displaced, no heaves or thrills on palpation.  Lungs: Clear to auscultation, no wheezing or rhonchi noted. No increased vocal fremitus resonant to percussion  Abdomen: Soft, nontender, nondistended, positive bowel sounds, no masses no hepatosplenomegaly noted.  Neuro: No focal neurological deficits noted cranial nerves II through XII grossly intact. Strength at baseline  in bilateral upper and lower extremities. Musculoskeletal: No warmth swelling or erythema around joints, no spinal tenderness noted. Psychiatric: Patient alert and oriented x3.   Data Reviewed: Basic Metabolic Panel:  Recent Labs Lab 08/30/16 0400 08/31/16 0422 09/01/16 0909 09/03/16 1547  NA 143 137 139 137  K 3.6 3.4* 3.6 3.1*  CL 115* 109 106 103  CO2 23 23 26 29   GLUCOSE 96 113* 88 100*  BUN 12 9 7  <5*  CREATININE 0.63 0.46  0.39* 0.43*  CALCIUM 9.4 9.0 9.4 8.8*   Liver Function Tests:  Recent Labs Lab 08/30/16 0400 09/01/16 0908  AST 21 28  ALT 19 27  ALKPHOS 58 49  BILITOT 1.4* 3.1*  PROT 7.5 7.6  ALBUMIN 4.1 4.2   No results for input(s): LIPASE, AMYLASE in the last 168 hours. No results for input(s): AMMONIA in the last 168 hours. CBC:  Recent Labs Lab 08/30/16 0400 08/31/16 0422 09/03/16 1547  WBC 14.8* 11.2* 10.6*  NEUTROABS 8.4* 4.4 4.8  HGB 9.2* 8.2* 7.0*  HCT 26.1* 22.5* 19.3*  MCV 82.1 80.6 81.8  PLT 599* 559* 635*   Cardiac Enzymes:  Recent Labs Lab 08/30/16 0400  TROPONINI <0.03   BNP  (last 3 results) No results for input(s): BNP in the last 8760 hours.  ProBNP (last 3 results) No results for input(s): PROBNP in the last 8760 hours.  CBG: No results for input(s): GLUCAP in the last 168 hours.  No results found for this or any previous visit (from the past 240 hour(s)).   Studies: Dg Chest 2 View  Result Date: 08/31/2016 CLINICAL DATA:  Sickle cell pain crisis for 3 days. EXAM: CHEST  2 VIEW COMPARISON:  08/15/2016 FINDINGS: The heart size and mediastinal contours are within normal limits. Mild scarring again noted in right lung base. No evidence of pulmonary infiltrate or edema. No evidence of pleural effusion or pneumothorax. IMPRESSION: No active cardiopulmonary disease. Electronically Signed   By: Earle Gell M.D.   On: 08/31/2016 09:36   Dg Chest 2 View  Result Date: 08/15/2016 CLINICAL DATA:  Chest pain for 3 days. EXAM: CHEST  2 VIEW COMPARISON:  None. FINDINGS: The cardiomediastinal silhouette is unremarkable. There is no evidence of focal airspace disease, pulmonary edema, suspicious pulmonary nodule/mass, pleural effusion, or pneumothorax. No acute bony abnormalities are identified. IMPRESSION: No active cardiopulmonary disease. Electronically Signed   By: Margarette Canada M.D.   On: 08/15/2016 18:55    Scheduled Meds: . enoxaparin (LOVENOX) injection  40 mg Subcutaneous Q24H  . folic acid  1 mg Oral Daily  . HYDROmorphone   Intravenous Q4H  . hydroxyurea  1,000 mg Oral Daily  . morphine  60 mg Oral Q12H  . oxyCODONE-acetaminophen  1 tablet Oral Q4H   And  . oxyCODONE  5 mg Oral Q4H  . senna-docusate  1 tablet Oral BID   Continuous Infusions: . sodium chloride 50 mL/hr at 09/04/16 1045    Principal Problem:   Sickle cell anemia with crisis [facility]) Active Problems:   Sickle cell pain crisis (HCC)   Leukocytosis   Thrombocytosis (HCC)   In excess of 25 minutes spent during this visit. Greater than 50% involved face to face contact with the patient for  assessment, counseling and coordination of care.

## 2016-09-05 LAB — CBC WITH DIFFERENTIAL/PLATELET
BASOS PCT: 2 %
BLASTS: 0 %
Band Neutrophils: 0 %
Basophils Absolute: 0.2 10*3/uL — ABNORMAL HIGH (ref 0.0–0.1)
Eosinophils Absolute: 0.3 10*3/uL (ref 0.0–0.7)
Eosinophils Relative: 3 %
HEMATOCRIT: 23.9 % — AB (ref 36.0–46.0)
HEMOGLOBIN: 8.4 g/dL — AB (ref 12.0–15.0)
LYMPHS PCT: 49 %
Lymphs Abs: 4 10*3/uL (ref 0.7–4.0)
MCH: 30 pg (ref 26.0–34.0)
MCHC: 35.1 g/dL (ref 30.0–36.0)
MCV: 85.4 fL (ref 78.0–100.0)
MYELOCYTES: 0 %
Metamyelocytes Relative: 0 %
Monocytes Absolute: 1.1 10*3/uL — ABNORMAL HIGH (ref 0.1–1.0)
Monocytes Relative: 13 %
NEUTROS PCT: 33 %
NRBC: 21 /100{WBCs} — AB
Neutro Abs: 2.8 10*3/uL (ref 1.7–7.7)
OTHER: 0 %
PROMYELOCYTES ABS: 0 %
Platelets: 665 10*3/uL — ABNORMAL HIGH (ref 150–400)
RBC: 2.8 MIL/uL — AB (ref 3.87–5.11)
RDW: 21.1 % — ABNORMAL HIGH (ref 11.5–15.5)
WBC: 8.4 10*3/uL (ref 4.0–10.5)

## 2016-09-05 LAB — COMPREHENSIVE METABOLIC PANEL
ALBUMIN: 4 g/dL (ref 3.5–5.0)
ALT: 89 U/L — AB (ref 14–54)
AST: 69 U/L — AB (ref 15–41)
Alkaline Phosphatase: 59 U/L (ref 38–126)
Anion gap: 7 (ref 5–15)
BILIRUBIN TOTAL: 2.5 mg/dL — AB (ref 0.3–1.2)
BUN: 6 mg/dL (ref 6–20)
CO2: 29 mmol/L (ref 22–32)
CREATININE: 0.48 mg/dL (ref 0.44–1.00)
Calcium: 9.3 mg/dL (ref 8.9–10.3)
Chloride: 105 mmol/L (ref 101–111)
GFR calc Af Amer: >60 mL/min (ref >60)
GLUCOSE: 127 mg/dL — AB (ref 65–99)
POTASSIUM: 3.3 mmol/L — AB (ref 3.5–5.1)
Sodium: 141 mmol/L (ref 135–145)
TOTAL PROTEIN: 7 g/dL (ref 6.5–8.1)

## 2016-09-05 MED ORDER — MORPHINE SULFATE ER 60 MG PO TBCR
30.0000 mg | EXTENDED_RELEASE_TABLET | Freq: Three times a day (TID) | ORAL | 0 refills | Status: DC
Start: 2016-09-05 — End: 2016-10-31

## 2016-09-05 NOTE — Discharge Summary (Signed)
Physician Discharge Summary  Patient ID: Carmen [name] MRN: [id number] DOB/AGE: 1991/10/13 25 y.o.  Admit date: 08/30/2016 Discharge date: 09/05/2016  Admission Diagnoses:  Discharge Diagnoses:  Principal Problem:   Sickle cell anemia with crisis [facility]) Active Problems:   Sickle cell pain crisis ([place])   Leukocytosis   Thrombocytosis ([place])   Discharged Condition: good  Hospital Course: patient admitted with sickle cell painful crisis. She largely do not have her long-acting pain medicine at home. She was apparently denied her MS contin by her insurance. She was treated with IV dilaudid PCA, Toradol and IVF. Also had toradol and IVF. Patient was transitioned to her home medications. I have given her 30 mg of MS contin 20 tablets to have until she can sort out her problem with getting her regular prescriptions.  Consults: None  Significant Diagnostic Studies: labs: Serial CBCs and CMPs checked. All within normal limits.  Treatments: IV hydration and analgesia: Dilaudid  Discharge Exam: Blood pressure 118/81, pulse 80, temperature 98.5 F (36.9 C), temperature source Oral, resp. rate 17, height 5\' 3"  (1.6 m), weight 75.6 kg (166 lb 10.7 oz), last menstrual period 08/06/2016, SpO2 95 %. General appearance: alert, cooperative and no distress Neck: no adenopathy, no carotid bruit, no JVD, supple, symmetrical, trachea midline and thyroid not enlarged, symmetric, no tenderness/mass/nodules Back: symmetric, no curvature. ROM normal. No CVA tenderness. Resp: clear to auscultation bilaterally Cardio: regular rate and rhythm, S1, S2 normal, no murmur, click, rub or gallop GI: soft, non-tender; bowel sounds normal; no masses,  no organomegaly Skin: Skin color, texture, turgor normal. No rashes or lesions Neurologic: Grossly normal  Disposition: 01-Home or Self Care     Medication List    TAKE these medications   acetaminophen 500 MG tablet Commonly known as:  TYLENOL Take 1,000 mg  by mouth every 6 (six) hours as needed for moderate pain.   hydroxyurea 500 MG capsule Commonly known as:  HYDREA Take 500 mg by mouth 2 (two) times daily.   ibuprofen 800 MG tablet Commonly known as:  ADVIL,MOTRIN Take 1 tablet (800 mg total) by mouth 3 (three) times daily. What changed:  when to take this  reasons to take this   metroNIDAZOLE 500 MG tablet Commonly known as:  FLAGYL Take 1 tablet (500 mg total) by mouth every 12 (twelve) hours.   morphine 60 MG 12 hr tablet Commonly known as:  MS CONTIN Take 1 tablet (60 mg total) by mouth every 8 (eight) hours. What changed:  when to take this   oxyCODONE-acetaminophen 10-325 MG tablet Commonly known as:  PERCOCET Take 1 tablet by mouth every 6 (six) hours as needed for pain.        SignedBarbette Merino 09/05/2016, 9:53 AM

## 2016-09-13 IMAGING — CT CT ANGIO CHEST
2 of 6 series · 18 of 36 positions shown · IV contrast (OMNIPAQUE)
Comparison: Chest CT February 03, 2014; chest radiograph February 25, 2015

CLINICAL DATA: Two day history of chest tightness and shortness of
breath. Sickle cell disease.

EXAM:
CT ANGIOGRAPHY CHEST WITH CONTRAST
TECHNIQUE: Multidetector CT imaging of the chest was performed using the
standard protocol during bolus administration of intravenous
contrast. Multiplanar CT image reconstructions and MIPs were
obtained to evaluate the vascular anatomy.
CONTRAST:  100 mL Omnipaque 350 nonionic

[Series 7: pe thins @ 1mm · axial · 0.68mm/px · z∈[-434,-202]mm · 17 of 258 slices shown]
[im 13/258  lung]
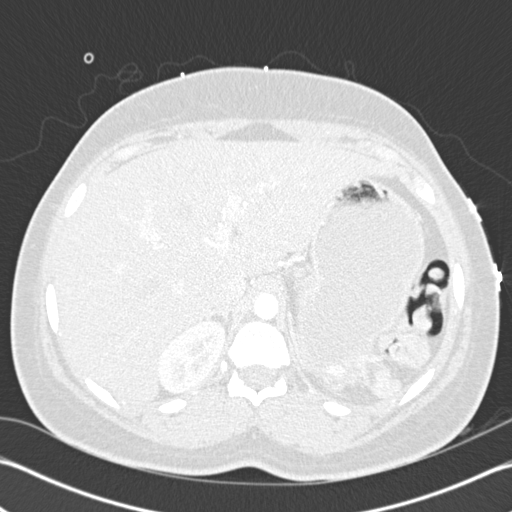
[im 26/258  mediastinal]
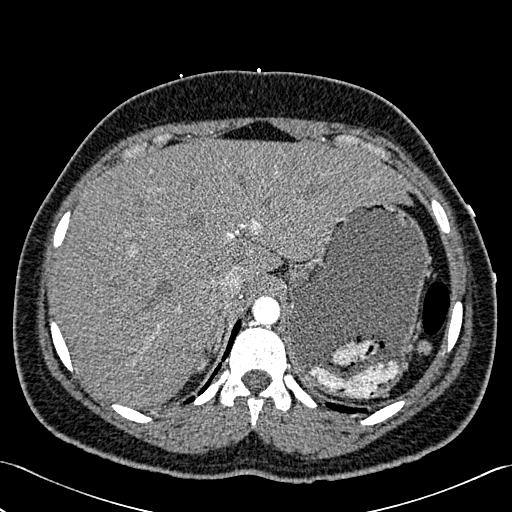
[im 39/258  lung]
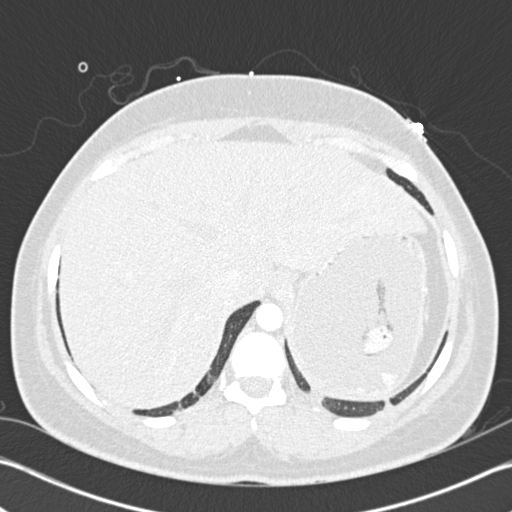
[im 52/258  mediastinal]
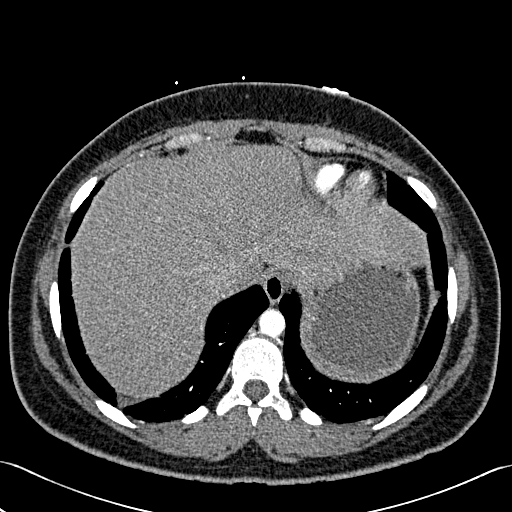
[im 78/258  lung]
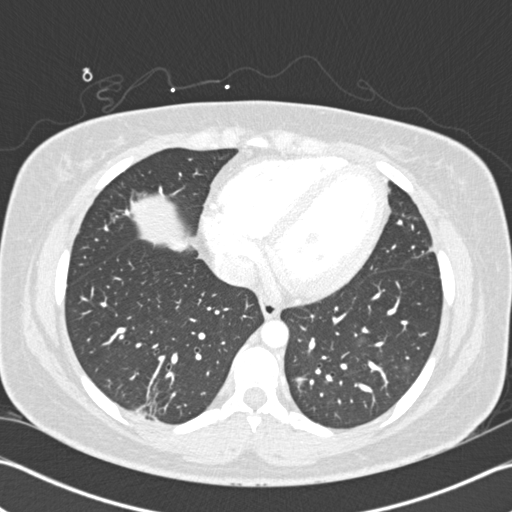
[im 90/258  mediastinal]
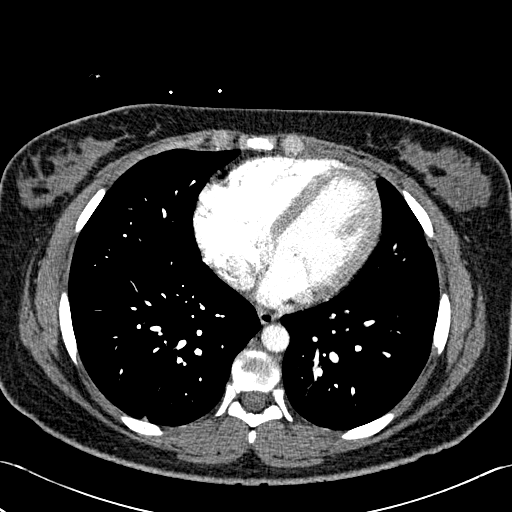
[im 103/258  lung]
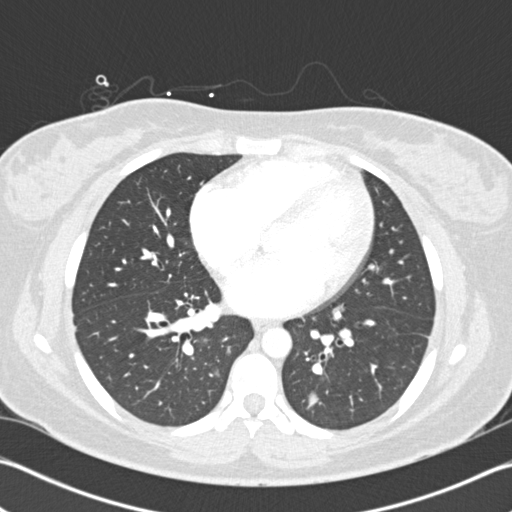
[im 116/258  mediastinal]
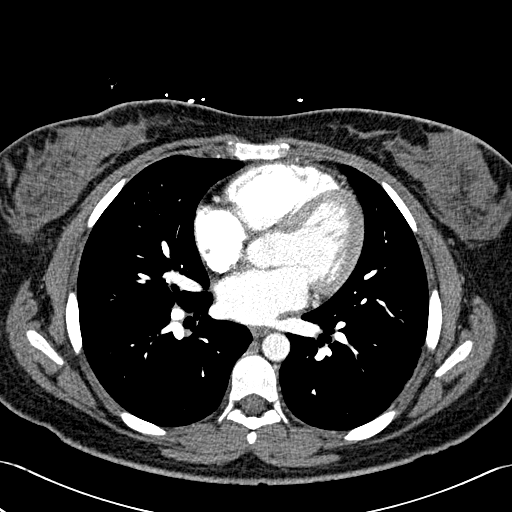
[im 129/258  lung]
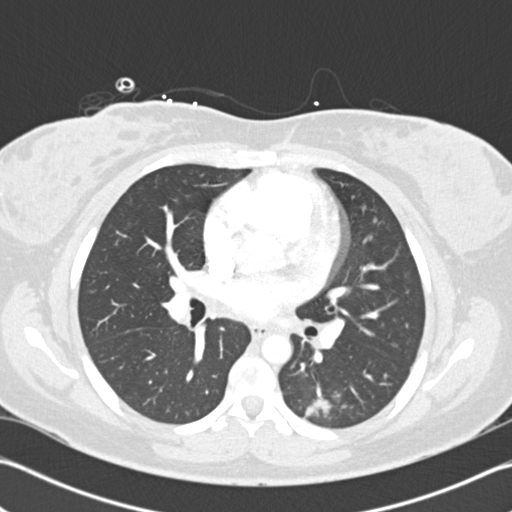
[im 142/258  mediastinal]
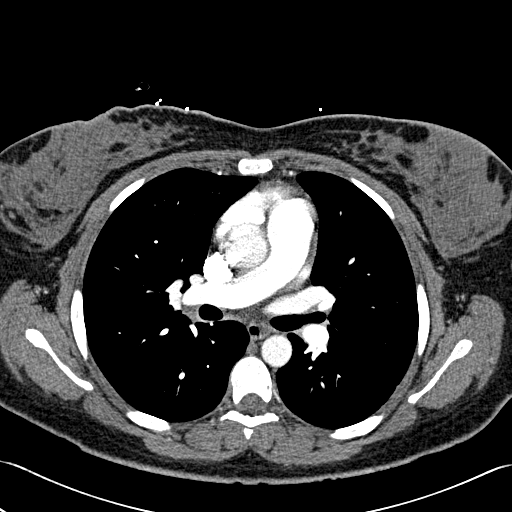
[im 155/258  lung]
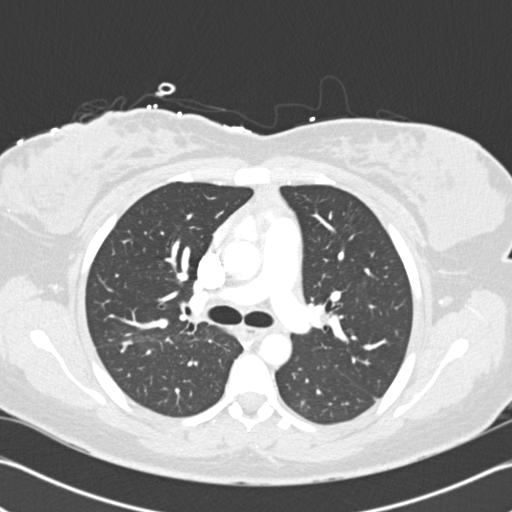
[im 168/258  mediastinal]
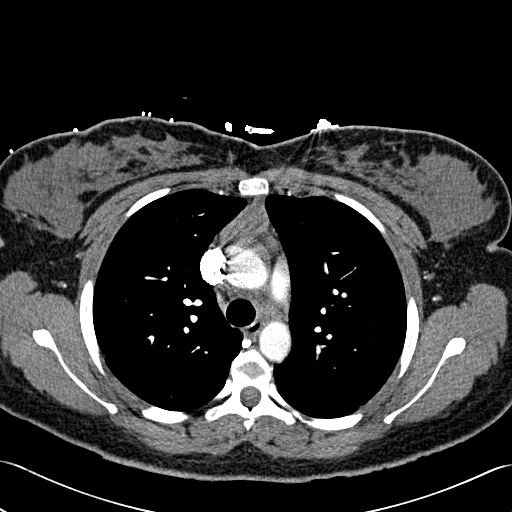
[im 180/258  lung]
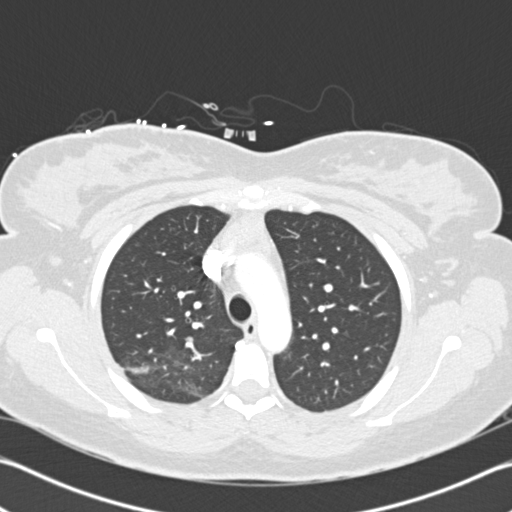
[im 206/258  mediastinal]
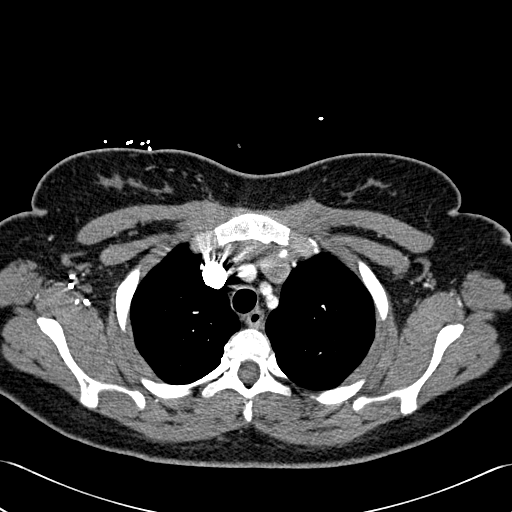
[im 219/258  lung]
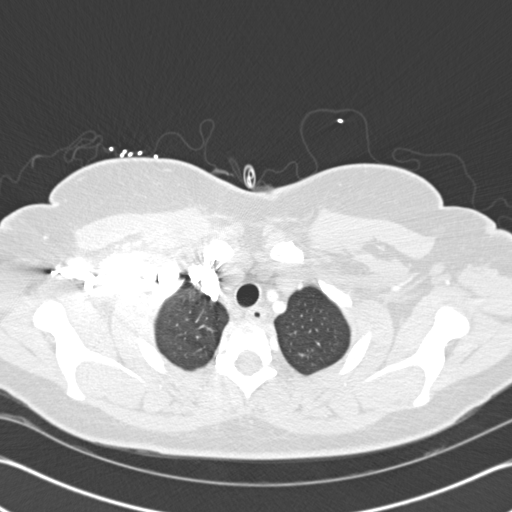
[im 232/258  mediastinal]
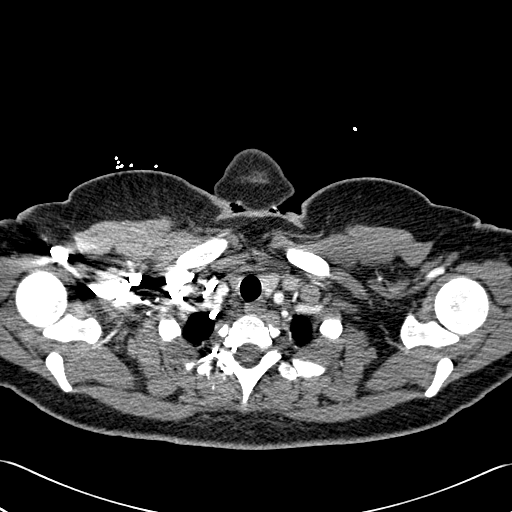
[im 245/258  lung]
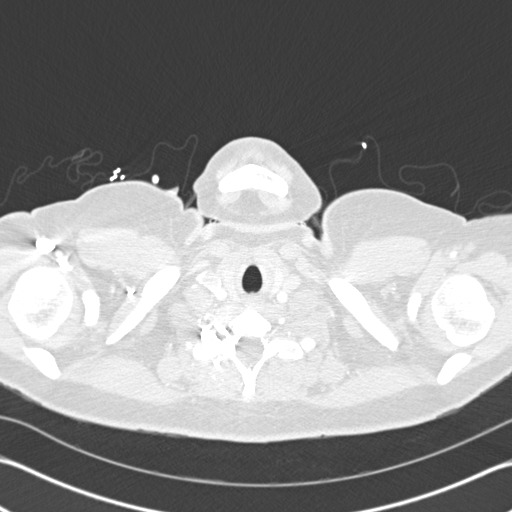

[Series 602: coronal mpr · coronal · 0.68mm/px · 1 of 98 slices shown]
[im 49/98  mediastinal]
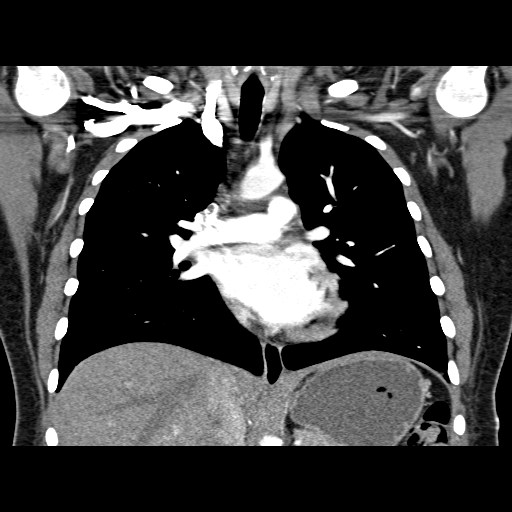

[18 of 36 positions shown; findings below may reference images not displayed]

FINDINGS: There is no demonstrable pulmonary embolus. There is no thoracic
aortic aneurysm or dissection.

Several focal areas of opacity are noted in the superior segment of
the left lower lobe consistent with multifocal pneumonia. A small
area of pneumonia is also noted in the posterior segment of the left
lower lobe. There is patchy atelectasis in both lung bases, slightly
more on the right than on the left. Mild atelectatic changes also
noted in the inferior aspect of the right middle lobe and lingula. A
small amount of ill-defined opacity is noted in the posterior
segment right upper lobe consistent with either atelectasis or the
earliest changes of pneumonia in this area.

There is residual thymic tissue, normal for age. There is no
appreciable thoracic adenopathy. The pericardium is not thickened.
Thyroid appears unremarkable.

In the visualized upper abdomen, there is evidence of previous
splenic infarct. There is hepatic steatosis.

Bony structures show evidence of sickle cell disease with areas of
thoracic end plate infarcts as well as patchy areas of sclerotic
change.

Review of the MIP images confirms the above findings.
IMPRESSION: Areas of pneumonia in the left lower lobe, primarily in the superior
segment. Suspect early pneumonia in the posterior segment of the
right upper lobe. There are areas of patchy atelectasis as outlined
above.

No demonstrable pulmonary embolus.

Bony changes of sickle cell disease. Status post splenic infarct.
Hepatic steatosis present.

## 2016-10-04 ENCOUNTER — Other Ambulatory Visit: Payer: Medicaid Other

## 2016-10-16 ENCOUNTER — Emergency Department (HOSPITAL_COMMUNITY)
Admission: EM | Admit: 2016-10-16 | Discharge: 2016-10-17 | Disposition: A | Discharge status: Home / Self Care | Payer: Medicaid Other | Attending: Emergency Medicine | Admitting: Emergency Medicine

## 2016-10-16 ENCOUNTER — Encounter (HOSPITAL_COMMUNITY): Payer: Self-pay | Admitting: Nurse Practitioner

## 2016-10-16 DIAGNOSIS — D5700 Hb-SS disease with crisis, unspecified: Secondary | ICD-10-CM | POA: Insufficient documentation

## 2016-10-16 DIAGNOSIS — N3001 Acute cystitis with hematuria: Secondary | ICD-10-CM | POA: Insufficient documentation

## 2016-10-16 LAB — URINALYSIS, ROUTINE W REFLEX MICROSCOPIC
BILIRUBIN URINE: NEGATIVE
Glucose, UA: NEGATIVE mg/dL
KETONES UR: NEGATIVE mg/dL
NITRITE: NEGATIVE
PH: 6 (ref 5.0–8.0)
Protein, ur: 100 mg/dL — AB
SPECIFIC GRAVITY, URINE: 1.01 (ref 1.005–1.030)
SQUAMOUS EPITHELIAL / LPF: NONE SEEN

## 2016-10-16 LAB — PREGNANCY, URINE: Preg Test, Ur: NEGATIVE

## 2016-10-16 MED ORDER — HYDROMORPHONE HCL 2 MG/ML IJ SOLN: 2.0000 mg | INTRAMUSCULAR | Status: DC

## 2016-10-16 MED ORDER — ONDANSETRON HCL 4 MG/2ML IJ SOLN
4.0000 mg | INTRAMUSCULAR | Status: DC | PRN
  Administered 2016-10-17: 4 mg via INTRAVENOUS
  Filled 2016-10-16: qty 2

## 2016-10-16 MED ORDER — HYDROMORPHONE HCL 2 MG/ML IJ SOLN: 2.0000 mg | INTRAMUSCULAR | Status: AC

## 2016-10-16 MED ORDER — HYDROMORPHONE HCL 2 MG/ML IJ SOLN
2.0000 mg | INTRAMUSCULAR | Status: DC
  Administered 2016-10-17: 2 mg via INTRAVENOUS
  Filled 2016-10-16: qty 1

## 2016-10-16 MED ORDER — HYDROMORPHONE HCL 2 MG/ML IJ SOLN
2.0000 mg | INTRAMUSCULAR | Status: AC
  Administered 2016-10-17: 2 mg via INTRAVENOUS
  Filled 2016-10-16: qty 1

## 2016-10-16 MED ORDER — DIPHENHYDRAMINE HCL 25 MG PO CAPS
25.0000 mg | ORAL_CAPSULE | ORAL | Status: DC | PRN
  Administered 2016-10-17: 25 mg via ORAL
  Filled 2016-10-16: qty 1

## 2016-10-16 MED ORDER — SODIUM CHLORIDE 0.45 % IV SOLN
INTRAVENOUS | Status: DC
  Administered 2016-10-17: via INTRAVENOUS

## 2016-10-16 MED ORDER — KETOROLAC TROMETHAMINE 30 MG/ML IJ SOLN
30.0000 mg | INTRAMUSCULAR | Status: AC
  Administered 2016-10-17: 30 mg via INTRAVENOUS
  Filled 2016-10-16: qty 1

## 2016-10-16 NOTE — ED Provider Notes (Signed)
[facility] DEPT Provider Note   CSN: [id number] Arrival date & time: 10/16/16  2216  By signing my name below, I, Soijett Blue, attest that this documentation has been prepared under the direction and in the presence of Junius Creamer, NP Electronically Signed: Soijett Blue, ED Scribe. 10/16/16. 11:19 PM.  History   Chief Complaint Chief Complaint  Patient presents with  . Sickle Cell Pain Crisis  . Hematuria  . Emesis    HPI Carmen [name] is a 25 y.o. female with a PMHx of sickle cell crisis, who presents to the Emergency Department complaining of sickle cell pain crisis onset earlier today. Pt states that her sickle cell pain is localized to her bilateral arms, back, and bilateral legs and is typical of her past sickle cell pain crisis episodes. Pt notes that she was last hospitalized for sickle cell pain crisis 2 months ago. Pt is having associated symptoms of dysuria x today, hematuria x today, and HA x today. Pt states that she had a HA following waking up from a nap prior to the onset of her symptoms. She notes that she has tried 2 doses of Rx morphine with no relief of her symptoms. She denies SOB, cough, rhinorrhea, nasal congestion, and any other symptoms. Pt reports that her LMP was 10/03/2016 and that she is sexually active at this time without the use of birth control. She denies allergies to medications at this time. Pt notes that her PCP is Dr. [name] and she denies going to the sickle cell clinic.     The history is provided by the patient. No language interpreter was used.    Past Medical History:  Diagnosis Date  . Abortion    05/2012  . Headache(784.0)   . Sickle cell crisis [facility])     Patient Active Problem List   Diagnosis Date Noted  . Skin abrasion   . Constipation   . Sickle cell anemia ([place]) 08/15/2016  . Breast lump 08/15/2016  . Anemia of chronic disease   . Acute URI 02/10/2016  . Sickle-cell crisis ([place]) 12/19/2015  . Sickle cell anemia  with crisis ([place]) 12/19/2015  . Chest pain 11/10/2015  . Hb-SS disease with crisis ([place]) 10/17/2015  . Sickle cell disease with crisis ([place]) 09/09/2015  . Dental caries   . Pain in a tooth or teeth   . Right facial swelling   . HCAP (healthcare-associated pneumonia) 02/28/2015  . Sickle cell crisis ([place]) 02/28/2015  . Sickle cell anemia with pain ([place]) 02/16/2015  . 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 Acute calculous cholecystitis s/p lap chole 11/30/2014 11/30/2014  . Hypokalemia 11/28/2014  . Atypical chest pain 11/28/2014  . Leukocytosis 11/22/2014  . Thrombocytosis ([place]) 11/22/2014  . Anemia 05/16/2014  . Migraine, unspecified, without mention of intractable migraine without mention of status migrainosus 05/16/2014  . Sickle cell pain crisis ([place]) 11/12/2013    Past Surgical History:  Procedure Laterality Date  . CHOLECYSTECTOMY N/A 11/30/2014   Procedure: LAPAROSCOPIC CHOLECYSTECTOMY SINGLE SITE WITH INTRAOPERATIVE CHOLANGIOGRAM;  Surgeon: Michael Boston, MD;  Location: [facility] ORS;  Service: General;  Laterality: N/A;  . SPLENECTOMY      OB History    Gravida Para Term Preterm AB Living   1       1     SAB TAB Ectopic Multiple Live Births                   Home Medications  Prior to Admission medications   Medication Sig Start Date End Date Taking? Authorizing Provider  acetaminophen (TYLENOL) 500 MG tablet Take 1,000 mg by mouth every 6 (six) hours as needed for moderate pain.   Yes Historical Provider, MD  hydroxyurea (HYDREA) 500 MG capsule Take 500 mg by mouth 2 (two) times daily. 07/14/16  Yes Historical Provider, MD  ibuprofen (ADVIL,MOTRIN) 800 MG tablet Take 1 tablet (800 mg total) by mouth 3 (three) times daily. Patient taking differently: Take 800 mg by mouth 3 (three) times daily as needed for moderate pain.  03/03/16  Yes Delsa Grana, PA-C  morphine (MS CONTIN) 60 MG 12 hr tablet Take 1 tablet (60 mg total) by mouth every 8 (eight)  hours. 09/05/16  Yes Elwyn Reach, MD  oxyCODONE-acetaminophen (PERCOCET) 10-325 MG tablet Take 1 tablet by mouth every 6 (six) hours as needed for pain. 05/22/16  Yes Tresa Garter, MD  metroNIDAZOLE (FLAGYL) 500 MG tablet Take 1 tablet (500 mg total) by mouth every 12 (twelve) hours. Patient not taking: Reported on 10/16/2016 08/23/16   Leana Gamer, MD  sulfamethoxazole-trimethoprim (BACTRIM DS,SEPTRA DS) 800-160 MG tablet Take 1 tablet by mouth 2 (two) times daily. 10/17/16 10/24/16  Junius Creamer, NP    Family History Family History  Problem Relation Age of Onset  . Hypertension Mother   . Sickle cell anemia Sister   . Kidney disease Sister     Lupus  . Arthritis Sister   . Sickle cell anemia Sister   . Sickle cell trait Sister   . Heart disease Maternal Aunt     CABG  . Heart disease Maternal Uncle     CABG  . Lupus Sister     Social History Social History  Substance Use Topics  . Smoking status: Never Smoker  . Smokeless tobacco: Never Used  . Alcohol use No     Allergies   Patient has no known allergies.   Review of Systems Review of Systems  HENT: Negative for congestion and rhinorrhea.   Respiratory: Negative for cough and shortness of breath.   Gastrointestinal: Positive for vomiting.  Genitourinary: Positive for dysuria and hematuria.  Musculoskeletal: Positive for back pain and myalgias (BUE and BLE).     Physical Exam Updated Vital Signs BP 115/72   Pulse 72   Temp 98.7 F (37.1 C) (Oral)   Resp 18   Ht 5\' 3"  (1.6 m)   Wt 77.3 kg   SpO2 97%   BMI 30.18 kg/m   Physical Exam  Constitutional: She is oriented to person, place, and time. She appears well-developed and well-nourished. No distress.  HENT:  Head: Normocephalic and atraumatic.  Eyes: EOM are normal.  Neck: Neck supple.  Cardiovascular: Normal rate, regular rhythm and normal heart sounds.  Exam reveals no gallop and no friction rub.   No murmur  heard. Pulmonary/Chest: Effort normal and breath sounds normal. No respiratory distress. She has no wheezes. She has no rales.  Abdominal: She exhibits no distension.  Musculoskeletal: Normal range of motion.  Neurological: She is alert and oriented to person, place, and time.  Skin: Skin is warm and dry.  Psychiatric: She has a normal mood and affect. Her behavior is normal.  Nursing note and vitals reviewed.    ED Treatments / Results  DIAGNOSTIC STUDIES: Oxygen Saturation is 98% on RA, nl by my interpretation.    COORDINATION OF CARE: 11:12 PM Discussed treatment plan with pt at bedside which includes dialudid, zofran,  benadryl, labs, UA, and pt agreed to plan.   Labs (all labs ordered are listed, but only abnormal results are displayed) Labs Reviewed  CBC WITH DIFFERENTIAL/PLATELET - Abnormal; Notable for the following:       Result Value   WBC 15.5 (*)    RBC 3.17 (*)    Hemoglobin 9.3 (*)    HCT 26.0 (*)    RDW 16.1 (*)    Platelets 524 (*)    Neutro Abs 9.3 (*)    Lymphs Abs 4.5 (*)    Monocytes Absolute 1.6 (*)    All other components within normal limits  RETICULOCYTES - Abnormal; Notable for the following:    Retic Ct Pct 8.1 (*)    RBC. 3.17 (*)    Retic Count, Manual 256.8 (*)    All other components within normal limits  URINALYSIS, ROUTINE W REFLEX MICROSCOPIC - Abnormal; Notable for the following:    APPearance CLOUDY (*)    Hgb urine dipstick LARGE (*)    Protein, ur 100 (*)    Leukocytes, UA LARGE (*)    Bacteria, UA RARE (*)    Non Squamous Epithelial 0-5 (*)    Crystals PRESENT (*)    All other components within normal limits  BASIC METABOLIC PANEL  PREGNANCY, URINE    Procedures Procedures (including critical care time)  Medications Ordered in ED Medications  0.45 % sodium chloride infusion ( Intravenous New Bag/Given 10/17/16 0021)  HYDROmorphone (DILAUDID) injection 2 mg (2 mg Intravenous Given 10/17/16 0240)  diphenhydrAMINE (BENADRYL)  capsule 25-50 mg (25 mg Oral Given 10/17/16 0024)  ondansetron (ZOFRAN) injection 4 mg (4 mg Intravenous Given 10/17/16 0024)  sulfamethoxazole-trimethoprim (BACTRIM DS,SEPTRA DS) 800-160 MG per tablet 1 tablet (not administered)  ketorolac (TORADOL) 30 MG/ML injection 30 mg (30 mg Intravenous Given 10/17/16 0024)  HYDROmorphone (DILAUDID) injection 2 mg (2 mg Intravenous Given 10/17/16 0025)    Or  HYDROmorphone (DILAUDID) injection 2 mg ( Subcutaneous See Alternative 10/17/16 0025)  HYDROmorphone (DILAUDID) injection 2 mg (2 mg Intravenous Given 10/17/16 0121)  HYDROmorphone (DILAUDID) injection 2 mg (2 mg Intravenous Given 10/17/16 0155)     Initial Impression / Assessment and Plan / ED Course  I have reviewed the triage vital signs and the nursing notes.  Pertinent labs that were available during my care of the patient were reviewed by me and considered in my medical decision making (see chart for details).  Clinical Course      Patient has received 4 doses of IV Dilaudid and now feels as though her pain is manageable with her home medication She aslohas a UTI and has been given Septra for 7 days with instructions to FU with PCP next week   Final Clinical Impressions(s) / ED Diagnoses   Final diagnoses:  Sickle cell pain crisis ([place])  Acute cystitis with hematuria    New Prescription I personally performed the services described in this documentation, which was scribed in my presence. The recorded information has been reviewed and is accurate.   Junius Creamer, NP 10/17/16 [id number]    Ripley Fraise, MD 10/17/16 [phone]

## 2016-10-16 NOTE — ED Triage Notes (Signed)
Pt is c/o SCC pain generalized and in particular worse diffuse abdominal pain and notice blood when she voided earlier today. Had an episode of emesis, adds that home medications did not help.

## 2016-10-16 NOTE — ED Notes (Signed)
Attempted to insert IV for lab works and meds, unsuccessful. Another staff attempted ,unable to obtain. To consult INV Team.

## 2016-10-17 LAB — BASIC METABOLIC PANEL
ANION GAP: 9 (ref 5–15)
BUN: 8 mg/dL (ref 6–20)
CHLORIDE: 104 mmol/L (ref 101–111)
CO2: 24 mmol/L (ref 22–32)
CREATININE: 0.46 mg/dL (ref 0.44–1.00)
Calcium: 9.3 mg/dL (ref 8.9–10.3)
GFR calc non Af Amer: >60 mL/min (ref >60)
Glucose, Bld: 92 mg/dL (ref 65–99)
POTASSIUM: 3.9 mmol/L (ref 3.5–5.1)
SODIUM: 137 mmol/L (ref 135–145)

## 2016-10-17 LAB — CBC WITH DIFFERENTIAL/PLATELET
Basophils Absolute: 0 10*3/uL (ref 0.0–0.1)
Basophils Relative: 0 %
EOS ABS: 0.1 10*3/uL (ref 0.0–0.7)
Eosinophils Relative: 1 %
HCT: 26 % — ABNORMAL LOW (ref 36.0–46.0)
HEMOGLOBIN: 9.3 g/dL — AB (ref 12.0–15.0)
LYMPHS ABS: 4.5 10*3/uL — AB (ref 0.7–4.0)
LYMPHS PCT: 29 %
MCH: 29.3 pg (ref 26.0–34.0)
MCHC: 35.8 g/dL (ref 30.0–36.0)
MCV: 82 fL (ref 78.0–100.0)
MONOS PCT: 10 %
Monocytes Absolute: 1.6 10*3/uL — ABNORMAL HIGH (ref 0.1–1.0)
NEUTROS PCT: 60 %
Neutro Abs: 9.3 10*3/uL — ABNORMAL HIGH (ref 1.7–7.7)
Platelets: 524 10*3/uL — ABNORMAL HIGH (ref 150–400)
RBC: 3.17 MIL/uL — AB (ref 3.87–5.11)
RDW: 16.1 % — ABNORMAL HIGH (ref 11.5–15.5)
WBC: 15.5 10*3/uL — ABNORMAL HIGH (ref 4.0–10.5)

## 2016-10-17 LAB — RETICULOCYTES
RBC.: 3.17 MIL/uL — ABNORMAL LOW (ref 3.87–5.11)
RETIC CT PCT: 8.1 % — AB (ref 0.4–3.1)
Retic Count, Absolute: 256.8 10*3/uL — ABNORMAL HIGH (ref 19.0–186.0)

## 2016-10-17 MED ORDER — SULFAMETHOXAZOLE-TRIMETHOPRIM 800-160 MG PO TABS: 1.0000 | ORAL_TABLET | Freq: Once | ORAL | Status: DC

## 2016-10-17 MED ORDER — SULFAMETHOXAZOLE-TRIMETHOPRIM 800-160 MG PO TABS: 1.0000 | ORAL_TABLET | Freq: Two times a day (BID) | ORAL | 0 refills | Status: AC

## 2016-10-17 NOTE — Discharge Instructions (Signed)
You have a urinary tract infection and have been given a prescription for Septra Please take this as directed until all tablets have been taken Drink plenty of water, Follow up with your PCP next week

## 2016-10-17 NOTE — ED Notes (Addendum)
Pt. Is upset ,yelling at her boyfriend and to  This   This Nurse because she wanted to be discharged ASAP. Refused for V/S taking.  Refused to listen to discharge instructions, pushed the discharge papers away from her.

## 2016-10-31 ENCOUNTER — Emergency Department (HOSPITAL_COMMUNITY)
Admission: EM | Admit: 2016-10-31 | Discharge: 2016-10-31 | Disposition: A | Discharge status: Home / Self Care | Payer: Medicaid Other | Attending: Emergency Medicine | Admitting: Emergency Medicine

## 2016-10-31 ENCOUNTER — Encounter (HOSPITAL_COMMUNITY): Payer: Self-pay | Admitting: Emergency Medicine

## 2016-10-31 DIAGNOSIS — D5700 Hb-SS disease with crisis, unspecified: Secondary | ICD-10-CM | POA: Insufficient documentation

## 2016-10-31 DIAGNOSIS — Z79899 Other long term (current) drug therapy: Secondary | ICD-10-CM | POA: Insufficient documentation

## 2016-10-31 LAB — CBC WITH DIFFERENTIAL/PLATELET
BASOS ABS: 0 10*3/uL (ref 0.0–0.1)
BASOS PCT: 0 %
EOS PCT: 1 %
Eosinophils Absolute: 0.1 10*3/uL (ref 0.0–0.7)
HCT: 26 % — ABNORMAL LOW (ref 36.0–46.0)
Hemoglobin: 9.3 g/dL — ABNORMAL LOW (ref 12.0–15.0)
LYMPHS PCT: 43 %
Lymphs Abs: 3.9 10*3/uL (ref 0.7–4.0)
MCH: 29.2 pg (ref 26.0–34.0)
MCHC: 35.8 g/dL (ref 30.0–36.0)
MCV: 81.8 fL (ref 78.0–100.0)
MONO ABS: 0.9 10*3/uL (ref 0.1–1.0)
Monocytes Relative: 10 %
NEUTROS ABS: 4.3 10*3/uL (ref 1.7–7.7)
Neutrophils Relative %: 46 %
PLATELETS: 715 10*3/uL — AB (ref 150–400)
RBC: 3.18 MIL/uL — AB (ref 3.87–5.11)
RDW: 15.9 % — AB (ref 11.5–15.5)
WBC: 9.2 10*3/uL (ref 4.0–10.5)

## 2016-10-31 LAB — COMPREHENSIVE METABOLIC PANEL
ALBUMIN: 4.4 g/dL (ref 3.5–5.0)
ALK PHOS: 57 U/L (ref 38–126)
ALT: 18 U/L (ref 14–54)
ANION GAP: 5 (ref 5–15)
AST: 46 U/L — ABNORMAL HIGH (ref 15–41)
BILIRUBIN TOTAL: 0.9 mg/dL (ref 0.3–1.2)
BUN: 10 mg/dL (ref 6–20)
CALCIUM: 9.1 mg/dL (ref 8.9–10.3)
CO2: 26 mmol/L (ref 22–32)
Chloride: 109 mmol/L (ref 101–111)
Creatinine, Ser: 0.47 mg/dL (ref 0.44–1.00)
GFR calc Af Amer: >60 mL/min (ref >60)
GLUCOSE: 81 mg/dL (ref 65–99)
POTASSIUM: 4.6 mmol/L (ref 3.5–5.1)
Sodium: 140 mmol/L (ref 135–145)
TOTAL PROTEIN: 8.2 g/dL — AB (ref 6.5–8.1)

## 2016-10-31 LAB — RETICULOCYTES
RBC.: 3.18 MIL/uL — AB (ref 3.87–5.11)
RETIC COUNT ABSOLUTE: 203.5 10*3/uL — AB (ref 19.0–186.0)
Retic Ct Pct: 6.4 % — ABNORMAL HIGH (ref 0.4–3.1)

## 2016-10-31 LAB — I-STAT BETA HCG BLOOD, ED (MC, WL, AP ONLY)

## 2016-10-31 MED ORDER — MORPHINE SULFATE ER 30 MG PO TBCR
30.0000 mg | EXTENDED_RELEASE_TABLET | Freq: Three times a day (TID) | ORAL | 0 refills | Status: DC
Start: 2016-10-31 — End: 2016-11-28

## 2016-10-31 MED ORDER — HYDROMORPHONE HCL 2 MG/ML IJ SOLN: 2.0000 mg | INTRAMUSCULAR | Status: DC

## 2016-10-31 MED ORDER — HYDROMORPHONE HCL 1 MG/ML IJ SOLN
0.5000 mg | Freq: Once | INTRAMUSCULAR | Status: AC
  Administered 2016-10-31: 0.5 mg via SUBCUTANEOUS

## 2016-10-31 MED ORDER — OXYCODONE-ACETAMINOPHEN 10-325 MG PO TABS: 1.0000 | ORAL_TABLET | Freq: Four times a day (QID) | ORAL | 0 refills | Status: DC | PRN

## 2016-10-31 MED ORDER — HYDROMORPHONE HCL 2 MG/ML IJ SOLN
2.0000 mg | INTRAMUSCULAR | Status: AC
  Administered 2016-10-31: 2 mg via INTRAVENOUS
  Filled 2016-10-31: qty 1

## 2016-10-31 MED ORDER — DIPHENHYDRAMINE HCL 50 MG/ML IJ SOLN
25.0000 mg | Freq: Once | INTRAMUSCULAR | Status: AC
  Administered 2016-10-31: 25 mg via INTRAVENOUS
  Filled 2016-10-31: qty 1

## 2016-10-31 MED ORDER — KETOROLAC TROMETHAMINE 30 MG/ML IJ SOLN
30.0000 mg | INTRAMUSCULAR | Status: AC
  Administered 2016-10-31: 30 mg via INTRAVENOUS
  Filled 2016-10-31: qty 1

## 2016-10-31 MED ORDER — HYDROMORPHONE HCL 2 MG/ML IJ SOLN: 2.0000 mg | INTRAMUSCULAR | Status: AC

## 2016-10-31 MED ORDER — SODIUM CHLORIDE 0.9 % IV BOLUS (SEPSIS)
1000.0000 mL | Freq: Once | INTRAVENOUS | Status: AC
  Administered 2016-10-31: 1000 mL via INTRAVENOUS

## 2016-10-31 NOTE — Discharge Instructions (Signed)
Take MS contin and oxycodone as prescribed.   See your doctor for your monthly prescriptions.   Return to ER if you have severe pain, chest pain, trouble breathing, fevers.

## 2016-10-31 NOTE — ED Triage Notes (Signed)
Pt c/o sickle cell crisis pain to back, arms, legs x 1 week. States this is similar to her typical crisis pain. Out of medicine, PCP office closed until January 2. No CP, SOB.

## 2016-10-31 NOTE — ED Provider Notes (Signed)
[place] DEPT Provider Note   CSN: [id number] Arrival date & time: 10/31/16  1722     History   Chief Complaint Chief Complaint  Patient presents with  . Sickle Cell Pain Crisis    HPI Carmen [name] is a 25 y.o. female hx of sickle cell SS disease, here with back pain, leg pain. Symptoms going on for a week or so. Denies any fever or chest pain. Patient states that this is similar to her previous crisis. Patient she ran out of her morphine and Percocet a week ago and her primary care doctor's office is not open this week so she was unable to get a refill.   The history is provided by the patient.    Past Medical History:  Diagnosis Date  . Abortion    05/2012  . Headache(784.0)   . Sickle cell crisis [facility])     Patient Active Problem List   Diagnosis Date Noted  . Skin abrasion   . Constipation   . Sickle cell anemia ([place]) 08/15/2016  . Breast lump 08/15/2016  . Anemia of chronic disease   . Acute URI 02/10/2016  . Sickle-cell crisis ([place]) 12/19/2015  . Sickle cell anemia with crisis ([place]) 12/19/2015  . Chest pain 11/10/2015  . 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 Sickle cell disease with crisis ([place]) 09/09/2015  . Dental caries   . Pain in a tooth or teeth   . Right facial swelling   . HCAP (healthcare-associated pneumonia) 02/28/2015  . Sickle cell crisis ([place]) 02/28/2015  . Sickle cell anemia with pain ([place]) 02/16/2015  . 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 Acute calculous cholecystitis s/p lap chole 11/30/2014 11/30/2014  . Hypokalemia 11/28/2014  . Atypical chest pain 11/28/2014  . Leukocytosis 11/22/2014  . Thrombocytosis ([place]) 11/22/2014  . Anemia 05/16/2014  . Migraine, unspecified, without mention of intractable migraine without mention of status migrainosus 05/16/2014  . Sickle cell pain crisis ([place]) 11/12/2013    Past Surgical History:  Procedure Laterality Date  . CHOLECYSTECTOMY N/A 11/30/2014   Procedure: LAPAROSCOPIC CHOLECYSTECTOMY SINGLE SITE WITH INTRAOPERATIVE CHOLANGIOGRAM;  Surgeon: Michael Boston, MD;  Location: [facility];  Service: General;  Laterality: N/A;  . SPLENECTOMY      OB History    Gravida Para Term Preterm AB Living   1       1     SAB TAB Ectopic Multiple Live Births                   Home Medications    Prior to Admission medications   Medication Sig Start Date End Date Taking? Authorizing Provider  acetaminophen (TYLENOL) 500 MG tablet Take 1,000 mg by mouth every 6 (six) hours as needed for moderate pain.   Yes Historical Provider, MD  hydroxyurea (HYDREA) 500 MG capsule Take 500 mg by mouth 2 (two) times daily. 07/14/16  Yes Historical Provider, MD  ibuprofen (ADVIL,MOTRIN) 800 MG tablet Take 1 tablet (800 mg total) by mouth 3 (three) times daily. Patient taking differently: Take 800 mg by mouth 3 (three) times daily as needed for moderate pain.  03/03/16  Yes Delsa Grana, PA-C  morphine (MS CONTIN) 60 MG 12 hr tablet Take 1 tablet (60 mg total) by mouth every 8 (eight) hours. 09/05/16  Yes Elwyn Reach, MD  oxyCODONE-acetaminophen (PERCOCET) 10-325 MG tablet Take 1 tablet by mouth every 6 (six) hours as needed for pain. 05/22/16  Yes [place]  Essie Christine, MD  metroNIDAZOLE (FLAGYL) 500 MG tablet Take 1 tablet (500 mg total) by mouth every 12 (twelve) hours. Patient not taking: Reported on 10/16/2016 08/23/16   Leana Gamer, MD  sulfamethoxazole-trimethoprim (BACTRIM DS,SEPTRA DS) 800-160 MG tablet Take 1 tablet by mouth 2 (two) times daily.  10/17/16   Historical Provider, MD    Family History Family History  Problem Relation Age of Onset  . Hypertension Mother   . Sickle cell anemia Sister   . Kidney disease Sister     Lupus  . Arthritis Sister   . Sickle cell anemia Sister   . Sickle cell trait Sister   . Heart disease Maternal Aunt     CABG  . Heart disease Maternal Uncle     CABG  . Lupus Sister     Social History Social History    Substance Use Topics  . Smoking status: Never Smoker  . Smokeless tobacco: Never Used  . Alcohol use No     Allergies   Patient has no known allergies.   Review of Systems Review of Systems  Musculoskeletal: Positive for back pain.       Leg pain   All other systems reviewed and are negative.    Physical Exam Updated Vital Signs BP 103/59   Pulse 78   Temp 98.9 F (37.2 C) (Oral)   Resp 17   Ht '5\' 3"'  (1.6 m)   Wt 173 lb (78.5 kg)   SpO2 94%   BMI 30.65 kg/m   Physical Exam  Constitutional: She is oriented to person, place, and time.  Uncomfortable   HENT:  Head: Normocephalic.  Eyes: EOM are normal. Pupils are equal, round, and reactive to light.  Neck: Normal range of motion. Neck supple.  Cardiovascular: Normal rate, regular rhythm and normal heart sounds.   Pulmonary/Chest: Effort normal and breath sounds normal. No respiratory distress. She has no wheezes. She has no rales.  Abdominal: Soft. Bowel sounds are normal. She exhibits no distension and no mass. There is no tenderness. There is no guarding.  Musculoskeletal: Normal range of motion.  No midline tenderness, no obvious bony deformity   Neurological: She is alert and oriented to person, place, and time.  Skin: Skin is warm.  Psychiatric: She has a normal mood and affect.  Nursing note and vitals reviewed.    ED Treatments / Results  Labs (all labs ordered are listed, but only abnormal results are displayed) Labs Reviewed  COMPREHENSIVE METABOLIC PANEL - Abnormal; Notable for the following:       Result Value   Total Protein 8.2 (*)    AST 46 (*)    All other components within normal limits  CBC WITH DIFFERENTIAL/PLATELET - Abnormal; Notable for the following:    RBC 3.18 (*)    Hemoglobin 9.3 (*)    HCT 26.0 (*)    RDW 15.9 (*)    Platelets 715 (*)    All other components within normal limits  RETICULOCYTES - Abnormal; Notable for the following:    Retic Ct Pct 6.4 (*)    RBC. 3.18 (*)     Retic Count, Manual 203.5 (*)    All other components within normal limits  I-STAT BETA HCG BLOOD, ED ([facility], [facility], [facility] ONLY)    EKG  EKG Interpretation None       Radiology No results found.  Procedures Procedures (including critical care time)  Medications Ordered in ED Medications  HYDROmorphone (DILAUDID) injection 2 mg (  not administered)    Or  HYDROmorphone (DILAUDID) injection 2 mg (not administered)  HYDROmorphone (DILAUDID) injection 0.5 mg (0.5 mg Subcutaneous Given 10/31/16 1757)  ketorolac (TORADOL) 30 MG/ML injection 30 mg (30 mg Intravenous Given 10/31/16 1832)  HYDROmorphone (DILAUDID) injection 2 mg (2 mg Intravenous Given 10/31/16 1832)    Or  HYDROmorphone (DILAUDID) injection 2 mg ( Subcutaneous See Alternative 10/31/16 1832)  HYDROmorphone (DILAUDID) injection 2 mg (2 mg Intravenous Given 10/31/16 1936)    Or  HYDROmorphone (DILAUDID) injection 2 mg ( Subcutaneous See Alternative 10/31/16 1936)  HYDROmorphone (DILAUDID) injection 2 mg (2 mg Intravenous Given 10/31/16 2009)    Or  HYDROmorphone (DILAUDID) injection 2 mg ( Subcutaneous See Alternative 10/31/16 2009)  diphenhydrAMINE (BENADRYL) injection 25 mg (25 mg Intravenous Given 10/31/16 1832)  sodium chloride 0.9 % bolus 1,000 mL (0 mLs Intravenous Stopped 10/31/16 1949)     Initial Impression / Assessment and Plan / ED Course  I have reviewed the triage vital signs and the nursing notes.  Pertinent labs & imaging results that were available during my care of the patient were reviewed by me and considered in my medical decision making (see chart for details).  Clinical Course     Carmen [name] is a 25 y.o. female here with back and leg pain. Likely sickle cell crisis. Will get labs, reticulocytes. Will give pain meds and reassess.   8:44 PM Hg 9.3, baseline. Ret 6.1, appropriate. Given 3 doses of dilaudid and pain still 9/10. She felt better. I offered admission for sickle cell crisis but she  refused. She states that she wants some pain meds to go home with and she will see PCP in 2 days when the office opens. Will give short course of pain meds, told her to return if she has more pain, chest pain, trouble breathing.     Final Clinical Impressions(s) / ED Diagnoses   Final diagnoses:  None    New Prescriptions New Prescriptions   No medications on file     Drenda Freeze, MD 10/31/16 2046

## 2016-11-23 ENCOUNTER — Emergency Department (HOSPITAL_COMMUNITY)
Admission: EM | Admit: 2016-11-23 | Discharge: 2016-11-24 | Disposition: A | Discharge status: Home / Self Care | Payer: Medicaid Other | Attending: Emergency Medicine | Admitting: Emergency Medicine

## 2016-11-23 ENCOUNTER — Emergency Department (HOSPITAL_COMMUNITY): Payer: Medicaid Other

## 2016-11-23 ENCOUNTER — Encounter (HOSPITAL_COMMUNITY): Payer: Self-pay | Admitting: Emergency Medicine

## 2016-11-23 DIAGNOSIS — D5700 Hb-SS disease with crisis, unspecified: Secondary | ICD-10-CM | POA: Diagnosis not present

## 2016-11-23 LAB — COMPREHENSIVE METABOLIC PANEL
ALK PHOS: 56 U/L (ref 38–126)
ALT: 32 U/L (ref 14–54)
ANION GAP: 7 (ref 5–15)
AST: 30 U/L (ref 15–41)
Albumin: 4.4 g/dL (ref 3.5–5.0)
BILIRUBIN TOTAL: 1.4 mg/dL — AB (ref 0.3–1.2)
BUN: 12 mg/dL (ref 6–20)
CALCIUM: 9.2 mg/dL (ref 8.9–10.3)
CO2: 24 mmol/L (ref 22–32)
Chloride: 109 mmol/L (ref 101–111)
Creatinine, Ser: 0.36 mg/dL — ABNORMAL LOW (ref 0.44–1.00)
Glucose, Bld: 97 mg/dL (ref 65–99)
Potassium: 3.7 mmol/L (ref 3.5–5.1)
Sodium: 140 mmol/L (ref 135–145)
TOTAL PROTEIN: 8.2 g/dL — AB (ref 6.5–8.1)

## 2016-11-23 LAB — CBC WITH DIFFERENTIAL/PLATELET
BASOS ABS: 0 10*3/uL (ref 0.0–0.1)
BASOS PCT: 0 %
Eosinophils Absolute: 0.1 10*3/uL (ref 0.0–0.7)
Eosinophils Relative: 1 %
HEMATOCRIT: 27.9 % — AB (ref 36.0–46.0)
HEMOGLOBIN: 9.7 g/dL — AB (ref 12.0–15.0)
Lymphocytes Relative: 43 %
Lymphs Abs: 3.2 10*3/uL (ref 0.7–4.0)
MCH: 28.2 pg (ref 26.0–34.0)
MCHC: 34.8 g/dL (ref 30.0–36.0)
MCV: 81.1 fL (ref 78.0–100.0)
MONO ABS: 1.1 10*3/uL — AB (ref 0.1–1.0)
Monocytes Relative: 15 %
NEUTROS ABS: 3.1 10*3/uL (ref 1.7–7.7)
NEUTROS PCT: 41 %
Platelets: 580 10*3/uL — ABNORMAL HIGH (ref 150–400)
RBC: 3.44 MIL/uL — ABNORMAL LOW (ref 3.87–5.11)
RDW: 15.1 % (ref 11.5–15.5)
WBC: 7.6 10*3/uL (ref 4.0–10.5)

## 2016-11-23 LAB — I-STAT TROPONIN, ED: TROPONIN I, POC: 0.01 ng/mL (ref 0.00–0.08)

## 2016-11-23 LAB — RETICULOCYTES
RBC.: 3.44 MIL/uL — AB (ref 3.87–5.11)
RETIC COUNT ABSOLUTE: 223.6 10*3/uL — AB (ref 19.0–186.0)
Retic Ct Pct: 6.5 % — ABNORMAL HIGH (ref 0.4–3.1)

## 2016-11-23 MED ORDER — HYDROMORPHONE HCL 1 MG/ML IJ SOLN
0.5000 mg | Freq: Once | INTRAMUSCULAR | Status: AC
  Administered 2016-11-23: 0.5 mg via SUBCUTANEOUS
  Filled 2016-11-23: qty 1

## 2016-11-23 NOTE — ED Triage Notes (Signed)
Patient complaining of having a sickle cell crisis. Patient states her chest, back, and legs are hurting. Patient says it started to get worse today. Patient states she ran out of her medicine.

## 2016-11-23 NOTE — ED Provider Notes (Signed)
[place] DEPT Provider Note   CSN: [id number] Arrival date & time: 11/23/16  2136  By signing my name below, I, Carmen [name], attest that this documentation has been prepared under the direction and in the presence of Charlann Lange, PA-C. Electronically Signed: Norris [name] , ED Scribe. 11/23/16. 11:47 PM.   History   Chief Complaint Chief Complaint  Patient presents with  . Sickle Cell Pain Crisis    HPI  Carmen [name] is a 26 y.o. female with hx of sickle-cell who presents to the Emergency Department complaining of worsening moderate sickle cell pain with sudden onset x1 day. Pt states that she started experiencing chest pain, back pain and bilateral leg pain during the snow. She has taken above the prescribed amount of Morphine and Percocet for the pain with no relief. Pt denies cough, fever, SOB, vomiting. Of note, pt denies known allergies to medications, hx of blood clots. She states last time being admitted was a time back.    The history is provided by the patient. No language interpreter was used.    Past Medical History:  Diagnosis Date  . Abortion    05/2012  . Headache(784.0)   . Sickle cell crisis [facility])     Patient Active Problem List   Diagnosis Date Noted  . Skin abrasion   . Constipation   . Sickle cell anemia ([place]) 08/15/2016  . Breast lump 08/15/2016  . Anemia of chronic disease   . Acute URI 02/10/2016  . Sickle-cell crisis ([place]) 12/19/2015  . Sickle cell anemia with crisis ([place]) 12/19/2015  . Chest pain 11/10/2015  . Hb-SS disease with crisis ([place]) 10/17/2015  . Sickle cell disease with crisis ([place]) 09/09/2015  . Dental caries   . Pain in a tooth or teeth   . Right facial swelling   . HCAP (healthcare-associated pneumonia) 02/28/2015  . Sickle cell crisis ([place]) 02/28/2015  . Sickle cell anemia with pain ([place]) 02/16/2015  . 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 Acute calculous cholecystitis s/p lap chole  11/30/2014 11/30/2014  . Hypokalemia 11/28/2014  . Atypical chest pain 11/28/2014  . Leukocytosis 11/22/2014  . Thrombocytosis ([place]) 11/22/2014  . Anemia 05/16/2014  . Migraine, unspecified, without mention of intractable migraine without mention of status migrainosus 05/16/2014  . Sickle cell pain crisis ([place]) 11/12/2013    Past Surgical History:  Procedure Laterality Date  . CHOLECYSTECTOMY N/A 11/30/2014   Procedure: LAPAROSCOPIC CHOLECYSTECTOMY SINGLE SITE WITH INTRAOPERATIVE CHOLANGIOGRAM;  Surgeon: Michael Boston, MD;  Location: [facility];  Service: General;  Laterality: N/A;  . SPLENECTOMY      OB History    Gravida Para Term Preterm AB Living   1       1     SAB TAB Ectopic Multiple Live Births                   Home Medications    Prior to Admission medications   Medication Sig Start Date End Date Taking? Authorizing Provider  acetaminophen (TYLENOL) 500 MG tablet Take 1,000 mg by mouth every 6 (six) hours as needed for moderate pain.   Yes Historical Provider, MD  morphine (MS CONTIN) 60 MG 12 hr tablet Take 60 mg by mouth 2 (two) times daily as needed for pain. for pain 11/05/16  Yes Historical Provider, MD  oxyCODONE-acetaminophen (PERCOCET) 10-325 MG tablet Take 1 tablet by mouth every 6 (six) hours as needed for pain. 10/31/16  Yes Lujean Rave  Darl Householder, MD  ibuprofen (ADVIL,MOTRIN) 800 MG tablet Take 1 tablet (800 mg total) by mouth 3 (three) times daily. Patient not taking: Reported on 11/23/2016 03/03/16   Delsa Grana, PA-C  metroNIDAZOLE (FLAGYL) 500 MG tablet Take 1 tablet (500 mg total) by mouth every 12 (twelve) hours. Patient not taking: Reported on 10/16/2016 08/23/16   Leana Gamer, MD  morphine (MS CONTIN) 30 MG 12 hr tablet Take 1 tablet (30 mg total) by mouth every 8 (eight) hours. Patient not taking: Reported on 11/23/2016 10/31/16   Drenda Freeze, MD    Family History Family History  Problem Relation Age of Onset  . Hypertension Mother   . Sickle  cell anemia Sister   . Kidney disease Sister     Lupus  . Arthritis Sister   . Sickle cell anemia Sister   . Sickle cell trait Sister   . Heart disease Maternal Aunt     CABG  . Heart disease Maternal Uncle     CABG  . Lupus Sister     Social History Social History  Substance Use Topics  . Smoking status: Never Smoker  . Smokeless tobacco: Never Used  . Alcohol use No     Allergies   Patient has no known allergies.   Review of Systems Review of Systems  Constitutional: Negative for fever.  Respiratory: Negative for cough and shortness of breath.   Cardiovascular: Positive for chest pain.  Gastrointestinal: Negative for vomiting.  Musculoskeletal: Positive for arthralgias (bilateral leg pain) and back pain.     Physical Exam Updated Vital Signs BP 114/69 (BP Location: Left Arm)   Pulse 68   Temp 98.2 F (36.8 C) (Oral)   Resp 18   Ht 5\' 3"  (1.6 m)   Wt 173 lb 3 oz (78.6 kg)   LMP 11/02/2016   SpO2 100%   BMI 30.68 kg/m   Physical Exam  Constitutional: She appears well-developed and well-nourished. No distress.  HENT:  Head: Normocephalic and atraumatic.  Eyes: Conjunctivae are normal.  Neck: Neck supple.  Cardiovascular: Normal rate, regular rhythm and intact distal pulses.   No murmur heard. Pulmonary/Chest: Effort normal and breath sounds normal. No respiratory distress.  Abdominal: Soft. There is no tenderness.  Musculoskeletal: Normal range of motion. She exhibits no edema.  Neurological: She is alert.  Skin: Skin is warm and dry.  Psychiatric: She has a normal mood and affect.  Nursing note and vitals reviewed.    ED Treatments / Results   DIAGNOSTIC STUDIES: Oxygen Saturation is 100% on RA, normal by my interpretation.   COORDINATION OF CARE: 11:53 PM-Discussed next steps with pt. Pt verbalized understanding and is agreeable with the plan.    Labs (all labs ordered are listed, but only abnormal results are displayed) Labs Reviewed    COMPREHENSIVE METABOLIC PANEL - Abnormal; Notable for the following:       Result Value   Creatinine, Ser 0.36 (*)    Total Protein 8.2 (*)    Total Bilirubin 1.4 (*)    All other components within normal limits  CBC WITH DIFFERENTIAL/PLATELET - Abnormal; Notable for the following:    RBC 3.44 (*)    Hemoglobin 9.7 (*)    HCT 27.9 (*)    Platelets 580 (*)    Monocytes Absolute 1.1 (*)    All other components within normal limits  RETICULOCYTES - Abnormal; Notable for the following:    Retic Ct Pct 6.5 (*)    RBC. 3.44 (*)  Retic Count, Manual 223.6 (*)    All other components within normal limits  I-STAT TROPOININ, ED    EKG  EKG Interpretation None       Radiology Dg Chest 2 View  Result Date: 11/23/2016 CLINICAL DATA:  26 year old female with sickle cell presenting with chest pain. EXAM: CHEST  2 VIEW COMPARISON:  Chest radiograph dated 08/31/2016 FINDINGS: The heart size and mediastinal contours are within normal limits. Both lungs are clear. The visualized skeletal structures are unremarkable. Right upper quadrant cholecystectomy clips. IMPRESSION: No active cardiopulmonary disease. Electronically Signed   By: Anner Crete M.D.   On: 11/23/2016 23:40    Procedures Procedures (including critical care time)  Medications Ordered in ED Medications  HYDROmorphone (DILAUDID) injection 0.5 mg (0.5 mg Subcutaneous Given 11/23/16 2222)     Initial Impression / Assessment and Plan / ED Course  I have reviewed the triage vital signs and the nursing notes.  Pertinent labs & imaging results that were available during my care of the patient were reviewed by me and considered in my medical decision making (see chart for details).     Patient with a history of Sickle Cell Disease presents with typical pain of crisis. No fever.   Pain is significantly improved with protocol dosing of Dilaudid x 3 in ED. VSS. Fluids provided. She is requesting discharge home and is felt  appropriate for discharge.   Final Clinical Impressions(s) / ED Diagnoses   Final diagnoses:  None   1. Sickle cell anemia with pain   New Prescriptions New Prescriptions   No medications on file   I personally performed the services described in this documentation, which was scribed in my presence. The recorded information has been reviewed and is accurate.     Charlann Lange, PA-C 11/24/16 [place], MD 11/24/16 [phone]

## 2016-11-23 NOTE — ED Notes (Signed)
Attempted IV x 2 w/o success 

## 2016-11-24 MED ORDER — HYDROMORPHONE HCL 2 MG/ML IJ SOLN
2.0000 mg | INTRAMUSCULAR | Status: AC
  Administered 2016-11-24: 2 mg via INTRAVENOUS
  Filled 2016-11-24: qty 1

## 2016-11-24 MED ORDER — HYDROMORPHONE HCL 2 MG/ML IJ SOLN: 2.0000 mg | INTRAMUSCULAR | Status: AC

## 2016-11-24 MED ORDER — SODIUM CHLORIDE 0.45 % IV SOLN
INTRAVENOUS | Status: DC
  Administered 2016-11-24: 01:00:00 via INTRAVENOUS

## 2016-11-24 MED ORDER — KETOROLAC TROMETHAMINE 15 MG/ML IJ SOLN
15.0000 mg | INTRAMUSCULAR | Status: AC
  Administered 2016-11-24: 15 mg via INTRAVENOUS
  Filled 2016-11-24: qty 1

## 2016-11-24 NOTE — ED Notes (Signed)
IV team remains at bedside. Attempting IV start.

## 2016-11-25 ENCOUNTER — Encounter (HOSPITAL_COMMUNITY): Payer: Self-pay

## 2016-11-25 ENCOUNTER — Emergency Department (HOSPITAL_COMMUNITY)
Admission: EM | Admit: 2016-11-25 | Discharge: 2016-11-25 | Disposition: A | Discharge status: Home / Self Care | Payer: Medicaid Other | Attending: Emergency Medicine | Admitting: Emergency Medicine

## 2016-11-25 DIAGNOSIS — Z79899 Other long term (current) drug therapy: Secondary | ICD-10-CM | POA: Diagnosis not present

## 2016-11-25 DIAGNOSIS — D5700 Hb-SS disease with crisis, unspecified: Secondary | ICD-10-CM | POA: Diagnosis not present

## 2016-11-25 LAB — CBC WITH DIFFERENTIAL/PLATELET
BASOS PCT: 0 %
Basophils Absolute: 0 10*3/uL (ref 0.0–0.1)
Eosinophils Absolute: 0 10*3/uL (ref 0.0–0.7)
Eosinophils Relative: 1 %
HCT: 26.5 % — ABNORMAL LOW (ref 36.0–46.0)
HEMOGLOBIN: 9.5 g/dL — AB (ref 12.0–15.0)
LYMPHS ABS: 3 10*3/uL (ref 0.7–4.0)
Lymphocytes Relative: 40 %
MCH: 29.2 pg (ref 26.0–34.0)
MCHC: 35.8 g/dL (ref 30.0–36.0)
MCV: 81.5 fL (ref 78.0–100.0)
MONO ABS: 1.1 10*3/uL — AB (ref 0.1–1.0)
MONOS PCT: 15 %
NEUTROS ABS: 3.4 10*3/uL (ref 1.7–7.7)
Neutrophils Relative %: 44 %
Platelets: 610 10*3/uL — ABNORMAL HIGH (ref 150–400)
RBC: 3.25 MIL/uL — ABNORMAL LOW (ref 3.87–5.11)
RDW: 14.7 % (ref 11.5–15.5)
WBC: 7.5 10*3/uL (ref 4.0–10.5)

## 2016-11-25 LAB — COMPREHENSIVE METABOLIC PANEL
ALBUMIN: 4.6 g/dL (ref 3.5–5.0)
ALK PHOS: 58 U/L (ref 38–126)
ALT: 27 U/L (ref 14–54)
AST: 25 U/L (ref 15–41)
Anion gap: 5 (ref 5–15)
BILIRUBIN TOTAL: 1.4 mg/dL — AB (ref 0.3–1.2)
BUN: 12 mg/dL (ref 6–20)
CALCIUM: 9.6 mg/dL (ref 8.9–10.3)
CO2: 24 mmol/L (ref 22–32)
CREATININE: 0.51 mg/dL (ref 0.44–1.00)
Chloride: 108 mmol/L (ref 101–111)
GFR calc Af Amer: >60 mL/min (ref >60)
GLUCOSE: 97 mg/dL (ref 65–99)
POTASSIUM: 4 mmol/L (ref 3.5–5.1)
Sodium: 137 mmol/L (ref 135–145)
TOTAL PROTEIN: 8 g/dL (ref 6.5–8.1)

## 2016-11-25 LAB — RETICULOCYTES
RBC.: 3.25 MIL/uL — ABNORMAL LOW (ref 3.87–5.11)
Retic Count, Absolute: 133.3 10*3/uL (ref 19.0–186.0)
Retic Ct Pct: 4.1 % — ABNORMAL HIGH (ref 0.4–3.1)

## 2016-11-25 MED ORDER — HYDROMORPHONE HCL 2 MG/ML IJ SOLN: 2.0000 mg | INTRAMUSCULAR | Status: AC

## 2016-11-25 MED ORDER — HYDROMORPHONE HCL 2 MG/ML IJ SOLN
2.0000 mg | INTRAMUSCULAR | Status: AC
  Administered 2016-11-25: 2 mg via INTRAVENOUS
  Filled 2016-11-25: qty 1

## 2016-11-25 MED ORDER — ONDANSETRON HCL 4 MG/2ML IJ SOLN
4.0000 mg | INTRAMUSCULAR | Status: DC | PRN
  Administered 2016-11-25: 4 mg via INTRAVENOUS
  Filled 2016-11-25: qty 2

## 2016-11-25 MED ORDER — DIPHENHYDRAMINE HCL 50 MG/ML IJ SOLN
25.0000 mg | Freq: Once | INTRAMUSCULAR | Status: AC
  Administered 2016-11-25: 25 mg via INTRAVENOUS
  Filled 2016-11-25: qty 1

## 2016-11-25 MED ORDER — KETOROLAC TROMETHAMINE 30 MG/ML IJ SOLN
30.0000 mg | INTRAMUSCULAR | Status: AC
  Administered 2016-11-25: 30 mg via INTRAVENOUS
  Filled 2016-11-25: qty 1

## 2016-11-25 MED ORDER — OXYCODONE-ACETAMINOPHEN 5-325 MG PO TABS: 1.0000 | ORAL_TABLET | ORAL | 0 refills | Status: DC | PRN

## 2016-11-25 MED ORDER — SODIUM CHLORIDE 0.45 % IV SOLN
INTRAVENOUS | Status: DC
  Administered 2016-11-25: 09:00:00 via INTRAVENOUS

## 2016-11-25 NOTE — ED Triage Notes (Signed)
Pt here 2 days ago with same.  Pt having her sickle cell pain.  Pt with all over pain including chest.  Initial start last week.  Pt had run out of meds and pain is getting worse.

## 2016-11-25 NOTE — ED Notes (Signed)
Patient aware that a urine sample is needed. Patient will notify staff when able to provide.  

## 2016-11-25 NOTE — ED Provider Notes (Signed)
[facility] DEPT Provider Note   CSN: [id number] Arrival date & time: 11/25/16  [id number]     History   Chief Complaint Chief Complaint  Patient presents with  . Chest Pain  . Sickle Cell Pain Crisis    HPI Carmen [name] is a 26 y.o. female.  Pt presents to the ED today with a sickle cell pain crisis.  She said that this is her typical pain.   She has pain in her chest and in her back.  She is out of her home pain meds.  She said she called her pcp yesterday, but did not hear back from her doctor.  She was here 2 days ago with the same.        Past Medical History:  Diagnosis Date  . Abortion    05/2012  . Headache(784.0)   . Sickle cell crisis [facility])     Patient Active Problem List   Diagnosis Date Noted  . Skin abrasion   . Constipation   . Sickle cell anemia ([place]) 08/15/2016  . Breast lump 08/15/2016  . Anemia of chronic disease   . Acute URI 02/10/2016  . Sickle-cell crisis ([place]) 12/19/2015  . Sickle cell anemia with crisis ([place]) 12/19/2015  . Chest pain 11/10/2015  . 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 Sickle cell disease with crisis ([place]) 09/09/2015  . Dental caries   . Pain in a tooth or teeth   . Right facial swelling   . HCAP (healthcare-associated pneumonia) 02/28/2015  . Sickle cell crisis ([place]) 02/28/2015  . Sickle cell anemia with pain ([place]) 02/16/2015  . 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 Acute calculous cholecystitis s/p lap chole 11/30/2014 11/30/2014  . Hypokalemia 11/28/2014  . Atypical chest pain 11/28/2014  . Leukocytosis 11/22/2014  . Thrombocytosis ([place]) 11/22/2014  . Anemia 05/16/2014  . Migraine, unspecified, without mention of intractable migraine without mention of status migrainosus 05/16/2014  . Sickle cell pain crisis ([place]) 11/12/2013    Past Surgical History:  Procedure Laterality Date  . CHOLECYSTECTOMY N/A 11/30/2014   Procedure: LAPAROSCOPIC CHOLECYSTECTOMY SINGLE SITE WITH  INTRAOPERATIVE CHOLANGIOGRAM;  Surgeon: Michael Boston, MD;  Location: [facility] ORS;  Service: General;  Laterality: N/A;  . SPLENECTOMY      OB History    Gravida Para Term Preterm AB Living   1       1     SAB TAB Ectopic Multiple Live Births                   Home Medications    Prior to Admission medications   Medication Sig Start Date End Date Taking? Authorizing Provider  morphine (MS CONTIN) 60 MG 12 hr tablet Take 60 mg by mouth every 6 (six) hours as needed for pain. for pain 11/05/16  Yes Historical Provider, MD  oxyCODONE-acetaminophen (PERCOCET) 10-325 MG tablet Take 1 tablet by mouth every 6 (six) hours as needed for pain. Patient taking differently: Take 1 tablet by mouth every 4 (four) hours as needed for pain.  10/31/16  Yes Drenda Freeze, MD  ibuprofen (ADVIL,MOTRIN) 800 MG tablet Take 1 tablet (800 mg total) by mouth 3 (three) times daily. Patient not taking: Reported on 11/23/2016 03/03/16   Delsa Grana, PA-C  metroNIDAZOLE (FLAGYL) 500 MG tablet Take 1 tablet (500 mg total) by mouth every 12 (twelve) hours. Patient not taking: Reported on 10/16/2016 08/23/16   Leana Gamer, MD  morphine (MS CONTIN)  30 MG 12 hr tablet Take 1 tablet (30 mg total) by mouth every 8 (eight) hours. Patient not taking: Reported on 11/23/2016 10/31/16   Drenda Freeze, MD  oxyCODONE-acetaminophen (PERCOCET/ROXICET) 5-325 MG tablet Take 1 tablet by mouth every 4 (four) hours as needed for severe pain. 11/25/16   Isla Pence, MD    Family History Family History  Problem Relation Age of Onset  . Hypertension Mother   . Sickle cell anemia Sister   . Kidney disease Sister     Lupus  . Arthritis Sister   . Sickle cell anemia Sister   . Sickle cell trait Sister   . Heart disease Maternal Aunt     CABG  . Heart disease Maternal Uncle     CABG  . Lupus Sister     Social History Social History  Substance Use Topics  . Smoking status: Never Smoker  . Smokeless tobacco: Never Used    . Alcohol use No     Allergies   Patient has no known allergies.   Review of Systems Review of Systems  Cardiovascular: Positive for chest pain.  Musculoskeletal: Positive for back pain.  All other systems reviewed and are negative.    Physical Exam Updated Vital Signs BP 137/95 (BP Location: Left Arm)   Pulse 116   Temp 98.9 F (37.2 C) (Oral)   Resp 20 Comment:    LMP 11/02/2016   SpO2 100%   Physical Exam  Constitutional: She is oriented to person, place, and time. She appears well-developed and well-nourished.  HENT:  Head: Normocephalic and atraumatic.  Right Ear: External ear normal.  Left Ear: External ear normal.  Nose: Nose normal.  Mouth/Throat: Oropharynx is clear and moist.  Eyes: Conjunctivae and EOM are normal. Pupils are equal, round, and reactive to light.  Neck: Normal range of motion. Neck supple.  Cardiovascular: Normal rate, regular rhythm, normal heart sounds and intact distal pulses.   Pulmonary/Chest: Effort normal and breath sounds normal.  Abdominal: Soft. Bowel sounds are normal.  Musculoskeletal: Normal range of motion.  Neurological: She is alert and oriented to person, place, and time.  Skin: Skin is warm and dry.  Psychiatric: She has a normal mood and affect. Her behavior is normal. Judgment and thought content normal.  Nursing note and vitals reviewed.    ED Treatments / Results  Labs (all labs ordered are listed, but only abnormal results are displayed) Labs Reviewed  CBC WITH DIFFERENTIAL/PLATELET - Abnormal; Notable for the following:       Result Value   RBC 3.25 (*)    Hemoglobin 9.5 (*)    HCT 26.5 (*)    Platelets 610 (*)    Monocytes Absolute 1.1 (*)    All other components within normal limits  RETICULOCYTES - Abnormal; Notable for the following:    Retic Ct Pct 4.1 (*)    RBC. 3.25 (*)    All other components within normal limits  COMPREHENSIVE METABOLIC PANEL - Abnormal; Notable for the following:    Total  Bilirubin 1.4 (*)    All other components within normal limits  URINALYSIS, ROUTINE W REFLEX MICROSCOPIC  PREGNANCY, URINE    EKG  EKG Interpretation None       Radiology Dg Chest 2 View  Result Date: 11/23/2016 CLINICAL DATA:  26 year old female with sickle cell presenting with chest pain. EXAM: CHEST  2 VIEW COMPARISON:  Chest radiograph dated 08/31/2016 FINDINGS: The heart size and mediastinal contours are within normal limits.  Both lungs are clear. The visualized skeletal structures are unremarkable. Right upper quadrant cholecystectomy clips. IMPRESSION: No active cardiopulmonary disease. Electronically Signed   By: Anner Crete M.D.   On: 11/23/2016 23:40    Procedures Procedures (including critical care time)  Medications Ordered in ED Medications  0.45 % sodium chloride infusion ( Intravenous New Bag/Given 11/25/16 0849)  ondansetron (ZOFRAN) injection 4 mg (4 mg Intravenous Given 11/25/16 0849)  ketorolac (TORADOL) 30 MG/ML injection 30 mg (30 mg Intravenous Given 11/25/16 0849)  HYDROmorphone (DILAUDID) injection 2 mg (2 mg Intravenous Given 11/25/16 0850)    Or  HYDROmorphone (DILAUDID) injection 2 mg ( Subcutaneous See Alternative 11/25/16 0850)  diphenhydrAMINE (BENADRYL) injection 25 mg (25 mg Intravenous Given 11/25/16 0849)  HYDROmorphone (DILAUDID) injection 2 mg (2 mg Intravenous Given 11/25/16 0943)    Or  HYDROmorphone (DILAUDID) injection 2 mg ( Subcutaneous See Alternative 11/25/16 0943)  HYDROmorphone (DILAUDID) injection 2 mg (2 mg Intravenous Given 11/25/16 1118)    Or  HYDROmorphone (DILAUDID) injection 2 mg ( Subcutaneous See Alternative 11/25/16 1118)     Initial Impression / Assessment and Plan / ED Course  I have reviewed the triage vital signs and the nursing notes.  Pertinent labs & imaging results that were available during my care of the patient were reviewed by me and considered in my medical decision making (see chart for details).    Pt has  been very tearful while here.  She said that she is sick of having sickle cell disease.  She wishes she could tear it out and throw it away.  Pt is feeling better after 3rd dose of dilaudid (total of 6 mg).  Pt offered admission, but she wants to try to go home.  She is out of her pain meds, so I gave her a rx for 10 5 mg percocets.  She knows that she needs to call her pcp for chronic pain management.  She knows to return if worse.  Final Clinical Impressions(s) / ED Diagnoses   Final diagnoses:  Sickle cell pain crisis (HCC)    New Prescriptions New Prescriptions   OXYCODONE-ACETAMINOPHEN (PERCOCET/ROXICET) 5-325 MG TABLET    Take 1 tablet by mouth every 4 (four) hours as needed for severe pain.     Isla Pence, MD 11/25/16 [phone]

## 2016-11-25 NOTE — ED Notes (Signed)
Nurse is in the room doing an ultrasound IV collecting labs

## 2016-11-27 ENCOUNTER — Emergency Department (HOSPITAL_COMMUNITY)
Admission: EM | Admit: 2016-11-27 | Discharge: 2016-11-28 | Disposition: A | Discharge status: Home / Self Care | Payer: Medicaid Other | Attending: Emergency Medicine | Admitting: Emergency Medicine

## 2016-11-27 ENCOUNTER — Encounter (HOSPITAL_COMMUNITY): Payer: Self-pay | Admitting: Oncology

## 2016-11-27 DIAGNOSIS — Z79899 Other long term (current) drug therapy: Secondary | ICD-10-CM | POA: Diagnosis not present

## 2016-11-27 DIAGNOSIS — D5700 Hb-SS disease with crisis, unspecified: Secondary | ICD-10-CM | POA: Insufficient documentation

## 2016-11-27 NOTE — ED Triage Notes (Signed)
Pt states that she has been having crisis x 2 weeks.  Pt c/o typical sickle cell pain to back, legs and arms.  Rates pain 10/10.

## 2016-11-28 ENCOUNTER — Emergency Department (HOSPITAL_COMMUNITY): Payer: Medicaid Other

## 2016-11-28 LAB — CBC WITH DIFFERENTIAL/PLATELET
BASOS ABS: 0 10*3/uL (ref 0.0–0.1)
Basophils Relative: 0 %
EOS ABS: 0.1 10*3/uL (ref 0.0–0.7)
EOS PCT: 1 %
HCT: 26 % — ABNORMAL LOW (ref 36.0–46.0)
Hemoglobin: 9.3 g/dL — ABNORMAL LOW (ref 12.0–15.0)
LYMPHS PCT: 35 %
Lymphs Abs: 3.5 10*3/uL (ref 0.7–4.0)
MCH: 28.9 pg (ref 26.0–34.0)
MCHC: 35.8 g/dL (ref 30.0–36.0)
MCV: 80.7 fL (ref 78.0–100.0)
MONO ABS: 1.7 10*3/uL — AB (ref 0.1–1.0)
Monocytes Relative: 17 %
Neutro Abs: 4.7 10*3/uL (ref 1.7–7.7)
Neutrophils Relative %: 47 %
PLATELETS: 579 10*3/uL — AB (ref 150–400)
RBC: 3.22 MIL/uL — AB (ref 3.87–5.11)
RDW: 14.5 % (ref 11.5–15.5)
WBC: 10.1 10*3/uL (ref 4.0–10.5)

## 2016-11-28 LAB — COMPREHENSIVE METABOLIC PANEL
ALK PHOS: 61 U/L (ref 38–126)
ALT: 19 U/L (ref 14–54)
AST: 26 U/L (ref 15–41)
Albumin: 4.3 g/dL (ref 3.5–5.0)
Anion gap: 5 (ref 5–15)
BILIRUBIN TOTAL: 1.4 mg/dL — AB (ref 0.3–1.2)
BUN: 11 mg/dL (ref 6–20)
CALCIUM: 9.4 mg/dL (ref 8.9–10.3)
CO2: 23 mmol/L (ref 22–32)
Chloride: 111 mmol/L (ref 101–111)
Creatinine, Ser: 0.38 mg/dL — ABNORMAL LOW (ref 0.44–1.00)
GFR calc Af Amer: >60 mL/min (ref >60)
GLUCOSE: 94 mg/dL (ref 65–99)
POTASSIUM: 4.1 mmol/L (ref 3.5–5.1)
Sodium: 139 mmol/L (ref 135–145)
TOTAL PROTEIN: 8 g/dL (ref 6.5–8.1)

## 2016-11-28 LAB — RETICULOCYTES
RBC.: 3.22 MIL/uL — ABNORMAL LOW (ref 3.87–5.11)
RETIC COUNT ABSOLUTE: 115.9 10*3/uL (ref 19.0–186.0)
RETIC CT PCT: 3.6 % — AB (ref 0.4–3.1)

## 2016-11-28 MED ORDER — HYDROMORPHONE HCL 2 MG/ML IJ SOLN: 2.0000 mg | INTRAMUSCULAR | Status: AC

## 2016-11-28 MED ORDER — ONDANSETRON HCL 4 MG/2ML IJ SOLN
4.0000 mg | INTRAMUSCULAR | Status: DC | PRN
  Administered 2016-11-28: 4 mg via INTRAVENOUS
  Filled 2016-11-28: qty 2

## 2016-11-28 MED ORDER — HYDROMORPHONE HCL 2 MG/ML IJ SOLN
2.0000 mg | INTRAMUSCULAR | Status: AC
  Filled 2016-11-28: qty 1

## 2016-11-28 MED ORDER — MORPHINE SULFATE ER 30 MG PO TBCR: 30.0000 mg | EXTENDED_RELEASE_TABLET | Freq: Two times a day (BID) | ORAL | 0 refills | Status: DC

## 2016-11-28 MED ORDER — KETOROLAC TROMETHAMINE 30 MG/ML IJ SOLN
30.0000 mg | INTRAMUSCULAR | Status: AC
  Administered 2016-11-28: 30 mg via INTRAVENOUS
  Filled 2016-11-28: qty 1

## 2016-11-28 MED ORDER — HYDROMORPHONE HCL 2 MG/ML IJ SOLN
2.0000 mg | INTRAMUSCULAR | Status: AC
  Administered 2016-11-28: 2 mg via INTRAVENOUS
  Filled 2016-11-28: qty 1

## 2016-11-28 MED ORDER — HYDROMORPHONE HCL 2 MG/ML IJ SOLN: 2.0000 mg | INTRAMUSCULAR | Status: DC

## 2016-11-28 MED ORDER — HYDROMORPHONE HCL 2 MG/ML IJ SOLN
2.0000 mg | INTRAMUSCULAR | Status: AC
  Administered 2016-11-28: 2 mg via INTRAVENOUS

## 2016-11-28 MED ORDER — SODIUM CHLORIDE 0.45 % IV SOLN
INTRAVENOUS | Status: DC
  Administered 2016-11-28: 02:00:00 via INTRAVENOUS

## 2016-11-28 NOTE — Discharge Instructions (Signed)
1. Medications: Morphine 30mg  (3 tablets), usual home medications 2. Treatment: rest, drink plenty of fluids,  3. Follow Up: Please followup with your primary doctor at your appointment on Monday for discussion of your diagnoses and further evaluation after today's visit; if you do not have a primary care doctor use the resource guide provided to find one; Please return to the ER for worsening pain, fevers or other concerns.

## 2016-11-28 NOTE — ED Provider Notes (Signed)
[facility] DEPT Provider Note   CSN: [id number] Arrival date & time: 11/27/16  2340  By signing my name below, I, Judithe Modest, attest that this documentation has been prepared under the direction and in the presence of [employer], PA-C. Electronically Signed: Judithe Modest, ER Scribe. 06/12/2016. 1:41 AM.  History   Chief Complaint Chief Complaint  Patient presents with  . Sickle Cell Pain Crisis   The history is provided by the patient and medical records. No language interpreter was used.    HPI Comments: Carmen [name] is a 26 y.o. female who presents to the Emergency Department complaining of two weeks of severe pain with three days of associated hot and cold spells. She has a PMHx of sickle cell anemia and sickle cell pain crisis. She has run out of her pain medication. She has been to the ED three times in the past week due to severe pain. Her pain is focussed in her legs, arms and back right now. She denies CP, SOB. She endorses mild cough. She takes morphine and percocet at home for pain. She takes 1-2 percocet and 1-2 morphine every six hours at home when she is having severe pain. Patient reports that she has an upcoming appointment with her primary care physician on Monday but has been unable to contact him in the last week due to him being out of the office.  She reports she has previously declined admission because she is attempting to keep her job.   Past Medical History:  Diagnosis Date  . Abortion    05/2012  . Headache(784.0)   . Sickle cell crisis [facility])     Patient Active Problem List   Diagnosis Date Noted  . Skin abrasion   . Constipation   . Sickle cell anemia ([place]) 08/15/2016  . Breast lump 08/15/2016  . Anemia of chronic disease   . Acute URI 02/10/2016  . Sickle-cell crisis ([place]) 12/19/2015  . Sickle cell anemia with crisis ([place]) 12/19/2015  . Chest pain 11/10/2015  . 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 Sickle cell disease  with crisis ([place]) 09/09/2015  . Dental caries   . Pain in a tooth or teeth   . Right facial swelling   . HCAP (healthcare-associated pneumonia) 02/28/2015  . Sickle cell crisis ([place]) 02/28/2015  . Sickle cell anemia with pain ([place]) 02/16/2015  . 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 Acute calculous cholecystitis s/p lap chole 11/30/2014 11/30/2014  . Hypokalemia 11/28/2014  . Atypical chest pain 11/28/2014  . Leukocytosis 11/22/2014  . Thrombocytosis ([place]) 11/22/2014  . Anemia 05/16/2014  . Migraine, unspecified, without mention of intractable migraine without mention of status migrainosus 05/16/2014  . Sickle cell pain crisis ([place]) 11/12/2013    Past Surgical History:  Procedure Laterality Date  . CHOLECYSTECTOMY N/A 11/30/2014   Procedure: LAPAROSCOPIC CHOLECYSTECTOMY SINGLE SITE WITH INTRAOPERATIVE CHOLANGIOGRAM;  Surgeon: Michael Boston, MD;  Location: [facility] ORS;  Service: General;  Laterality: N/A;  . SPLENECTOMY      OB History    Gravida Para Term Preterm AB Living   1       1     SAB TAB Ectopic Multiple Live Births                   Home Medications    Prior to Admission medications   Medication Sig Start Date End Date Taking? Authorizing Provider  morphine (MS CONTIN) 60 MG 12  hr tablet Take 60 mg by mouth every 6 (six) hours as needed for pain. for pain 11/05/16  Yes Historical Provider, MD  oxyCODONE-acetaminophen (PERCOCET) 10-325 MG tablet Take 1 tablet by mouth every 6 (six) hours as needed for pain. Patient taking differently: Take 1 tablet by mouth every 4 (four) hours as needed for pain.  10/31/16  Yes Drenda Freeze, MD  morphine (MS CONTIN) 30 MG 12 hr tablet Take 1 tablet (30 mg total) by mouth every 12 (twelve) hours. 11/28/16   Jarrett Soho Khara Renaud, PA-C    Family History Family History  Problem Relation Age of Onset  . Hypertension Mother   . Sickle cell anemia Sister   . Kidney disease Sister     Lupus  . Arthritis  Sister   . Sickle cell anemia Sister   . Sickle cell trait Sister   . Heart disease Maternal Aunt     CABG  . Heart disease Maternal Uncle     CABG  . Lupus Sister     Social History Social History  Substance Use Topics  . Smoking status: Never Smoker  . Smokeless tobacco: Never Used  . Alcohol use No     Allergies   Patient has no known allergies.   Review of Systems Review of Systems  Constitutional: Positive for chills. Negative for fever.  Respiratory: Positive for cough.   Cardiovascular: Negative for chest pain and leg swelling.  Gastrointestinal: Negative for abdominal pain, nausea and vomiting.  Musculoskeletal: Positive for myalgias.     Physical Exam Updated Vital Signs BP 113/68   Pulse 96   Resp 18   Wt 170 lb (77.1 kg)   LMP 11/02/2016   SpO2 99%   BMI 30.11 kg/m   Physical Exam  Constitutional: She appears well-developed and well-nourished. No distress.  Awake, alert, nontoxic appearance Patient appears uncomfortable  HENT:  Head: Normocephalic and atraumatic.  Mouth/Throat: Oropharynx is clear and moist. No oropharyngeal exudate.  Eyes: Conjunctivae are normal. No scleral icterus.  Neck: Normal range of motion. Neck supple.  Cardiovascular: Normal rate, regular rhythm and intact distal pulses.   Pulmonary/Chest: Effort normal and breath sounds normal. No respiratory distress. She has no wheezes.  Equal chest expansion Clear and equal breath sounds  Abdominal: Soft. Bowel sounds are normal. She exhibits no mass. There is no tenderness. There is no rebound and no guarding.  Soft and nontender  Musculoskeletal: Normal range of motion. She exhibits no edema.  Full range of motion of all major joints. No swelling or erythema of the bilateral hips, knees, ankles, toes, shoulders, elbows, wrists, fingers  Neurological: She is alert.  Speech is clear and goal oriented Moves extremities without ataxia  Skin: Skin is warm and dry. She is not  diaphoretic.  Psychiatric: She has a normal mood and affect.  Nursing note and vitals reviewed.    ED Treatments / Results  DIAGNOSTIC STUDIES: Oxygen Saturation is 99% on RA, normal by my interpretation.    COORDINATION OF CARE: 1:38 AM Discussed treatment plan with pt at bedside and pt agreed to plan.  Labs (all labs ordered are listed, but only abnormal results are displayed) Labs Reviewed  COMPREHENSIVE METABOLIC PANEL - Abnormal; Notable for the following:       Result Value   Creatinine, Ser 0.38 (*)    Total Bilirubin 1.4 (*)    All other components within normal limits  CBC WITH DIFFERENTIAL/PLATELET - Abnormal; Notable for the following:    RBC  3.22 (*)    Hemoglobin 9.3 (*)    HCT 26.0 (*)    Platelets 579 (*)    Monocytes Absolute 1.7 (*)    All other components within normal limits  RETICULOCYTES - Abnormal; Notable for the following:    Retic Ct Pct 3.6 (*)    RBC. 3.22 (*)    All other components within normal limits    Radiology Dg Chest 2 View  Result Date: 11/28/2016 CLINICAL DATA:  26 year old female with cough. EXAM: CHEST  2 VIEW COMPARISON:  Chest radiograph dated 11/23/2016 and 08/31/2016 FINDINGS: Two views of the chest demonstrate stable area of density with blunting of the right posterior costophrenic angle on the lateral projection similar to the study dating back to 08/31/2016. No new consolidative changes noted. There is no pleural effusion or pneumothorax. The cardiac silhouette is within normal limits with no acute osseous pathology. Right upper quadrant cholecystectomy clips. IMPRESSION: No active cardiopulmonary disease. Electronically Signed   By: Anner Crete M.D.   On: 11/28/2016 03:02    Procedures Procedures (including critical care time)  Medications Ordered in ED Medications  0.45 % sodium chloride infusion ( Intravenous New Bag/Given 11/28/16 0145)  HYDROmorphone (DILAUDID) injection 2 mg (not administered)    Or    HYDROmorphone (DILAUDID) injection 2 mg (not administered)  ondansetron (ZOFRAN) injection 4 mg (4 mg Intravenous Given 11/28/16 0145)  ketorolac (TORADOL) 30 MG/ML injection 30 mg (30 mg Intravenous Given 11/28/16 0147)  HYDROmorphone (DILAUDID) injection 2 mg (2 mg Intravenous Given 11/28/16 0215)    Or  HYDROmorphone (DILAUDID) injection 2 mg ( Subcutaneous See Alternative 11/28/16 0215)  HYDROmorphone (DILAUDID) injection 2 mg (2 mg Intravenous Given 11/28/16 0245)    Or  HYDROmorphone (DILAUDID) injection 2 mg ( Subcutaneous See Alternative 11/28/16 0245)  HYDROmorphone (DILAUDID) injection 2 mg (2 mg Intravenous Given 11/28/16 0146)    Or  HYDROmorphone (DILAUDID) injection 2 mg ( Subcutaneous See Alternative 11/28/16 0146)     Initial Impression / Assessment and Plan / ED Course  I have reviewed the triage vital signs and the nursing notes.  Pertinent labs & imaging results that were available during my care of the patient were reviewed by me and considered in my medical decision making (see chart for details).  Clinical Course as of Nov 28 400  Sun Nov 28, 2016  0355 [place] narcotic database accessed. Patient was given refills of her oxycodone and morphine on 11/05/2016 from her primary care physician. She had an additional prescription of oxycodone on 11/25/2016 of 10 tablets from the emergency department.  No additional refills of her morphine.  [HM]    Clinical Course User Index [HM] Abigail Butts, PA-C    Patient presents with sickle cell pain crisis. She reports this crisis is the same as previous episodes. She reports chills at home and cough but no measured fever. Chest x-ray is without evidence of pneumonia or acute chest syndrome. Her hemoglobin is at baseline. Patient without urinary or vaginal symptoms. She reports symptoms today are the same as they have been all week. She is tearful and discusses being out of her pain medication. She reports she does not want to be  admitted because she wants to try to go to work. She has an appointment with her primary care on Monday for refills of her medications.  4:03 AM Patient's pain is well controlled at this time. She reports she wishes for discharge home. She request 3 tablets of medication to get  her through until Monday. I think this is reasonable. Discussed reasons to return to the emergency department including development of fever, worsening chills, development of new pain, shortness of breath, chest pain or other concerns.  Final Clinical Impressions(s) / ED Diagnoses   Final diagnoses:  Sickle cell pain crisis (HCC)    New Prescriptions New Prescriptions   MORPHINE (MS CONTIN) 30 MG 12 HR TABLET    Take 1 tablet (30 mg total) by mouth every 12 (twelve) hours.    I personally performed the services described in this documentation, which was scribed in my presence. The recorded information has been reviewed and is accurate.      Jarrett Soho Hinley Brimage, PA-C 11/28/16 [id number]    Sherwood Gambler, MD 12/03/16 [phone]

## 2016-11-29 MED ORDER — MORPHINE SULFATE ER 30 MG PO TBCR: 30.0000 mg | EXTENDED_RELEASE_TABLET | Freq: Two times a day (BID) | ORAL | 0 refills | Status: DC

## 2016-11-29 NOTE — ED Provider Notes (Signed)
Patient return to the emergency department after dropping her MS Contin Rx in the rain after discharge. She brought in the old Rx which is badly damaged. I have taken the damaged Rx and will refill the Rx 3 tabs.   Nanda Quinton, MD   Margette Fast, MD 11/29/16 [phone]

## 2016-11-30 ENCOUNTER — Encounter (HOSPITAL_COMMUNITY): Payer: Self-pay | Admitting: Emergency Medicine

## 2016-11-30 ENCOUNTER — Non-Acute Institutional Stay (EMERGENCY_DEPARTMENT_HOSPITAL)
Admission: AD | Admit: 2016-11-30 | Discharge: 2016-11-30 | Disposition: A | Discharge status: Home / Self Care | Payer: Medicaid Other | Source: Ambulatory Visit | Attending: Internal Medicine | Admitting: Internal Medicine

## 2016-11-30 ENCOUNTER — Emergency Department (HOSPITAL_COMMUNITY)
Admission: EM | Admit: 2016-11-30 | Discharge: 2016-11-30 | Disposition: A | Discharge status: Home / Self Care | Payer: Medicaid Other | Attending: Emergency Medicine | Admitting: Emergency Medicine

## 2016-11-30 DIAGNOSIS — D5700 Hb-SS disease with crisis, unspecified: Secondary | ICD-10-CM | POA: Diagnosis not present

## 2016-11-30 DIAGNOSIS — Z79899 Other long term (current) drug therapy: Secondary | ICD-10-CM | POA: Insufficient documentation

## 2016-11-30 LAB — CBC WITH DIFFERENTIAL/PLATELET
BASOS ABS: 0 10*3/uL (ref 0.0–0.1)
BASOS PCT: 0 %
EOS ABS: 0.1 10*3/uL (ref 0.0–0.7)
Eosinophils Relative: 1 %
HEMATOCRIT: 24.3 % — AB (ref 36.0–46.0)
Hemoglobin: 8.8 g/dL — ABNORMAL LOW (ref 12.0–15.0)
Lymphocytes Relative: 27 %
Lymphs Abs: 2.6 10*3/uL (ref 0.7–4.0)
MCH: 29.1 pg (ref 26.0–34.0)
MCHC: 36.2 g/dL — ABNORMAL HIGH (ref 30.0–36.0)
MCV: 80.5 fL (ref 78.0–100.0)
MONO ABS: 1.3 10*3/uL — AB (ref 0.1–1.0)
Monocytes Relative: 13 %
NEUTROS PCT: 59 %
Neutro Abs: 5.9 10*3/uL (ref 1.7–7.7)
Platelets: 615 10*3/uL — ABNORMAL HIGH (ref 150–400)
RBC: 3.02 MIL/uL — ABNORMAL LOW (ref 3.87–5.11)
RDW: 15.2 % (ref 11.5–15.5)
WBC: 9.9 10*3/uL (ref 4.0–10.5)

## 2016-11-30 LAB — COMPREHENSIVE METABOLIC PANEL
ALK PHOS: 58 U/L (ref 38–126)
ALT: 19 U/L (ref 14–54)
ANION GAP: 6 (ref 5–15)
AST: 23 U/L (ref 15–41)
Albumin: 4.3 g/dL (ref 3.5–5.0)
BILIRUBIN TOTAL: 1.9 mg/dL — AB (ref 0.3–1.2)
BUN: 10 mg/dL (ref 6–20)
CALCIUM: 9.3 mg/dL (ref 8.9–10.3)
CO2: 24 mmol/L (ref 22–32)
Chloride: 107 mmol/L (ref 101–111)
Creatinine, Ser: 0.58 mg/dL (ref 0.44–1.00)
GFR calc non Af Amer: >60 mL/min (ref >60)
Glucose, Bld: 95 mg/dL (ref 65–99)
Potassium: 3.7 mmol/L (ref 3.5–5.1)
Sodium: 137 mmol/L (ref 135–145)
TOTAL PROTEIN: 7.9 g/dL (ref 6.5–8.1)

## 2016-11-30 LAB — RETICULOCYTES
RBC.: 3.02 MIL/uL — AB (ref 3.87–5.11)
RETIC COUNT ABSOLUTE: 169.1 10*3/uL (ref 19.0–186.0)
RETIC CT PCT: 5.6 % — AB (ref 0.4–3.1)

## 2016-11-30 MED ORDER — SODIUM CHLORIDE 0.9 % IV SOLN
25.0000 mg | INTRAVENOUS | Status: DC | PRN
  Filled 2016-11-30: qty 0.5

## 2016-11-30 MED ORDER — DEXTROSE-NACL 5-0.45 % IV SOLN
INTRAVENOUS | Status: DC
  Administered 2016-11-30: 11:00:00 via INTRAVENOUS

## 2016-11-30 MED ORDER — HYDROMORPHONE HCL 2 MG/ML IJ SOLN: 2.0000 mg | INTRAMUSCULAR | Status: AC

## 2016-11-30 MED ORDER — ONDANSETRON HCL 4 MG/2ML IJ SOLN: 4.0000 mg | Freq: Four times a day (QID) | INTRAMUSCULAR | Status: DC | PRN

## 2016-11-30 MED ORDER — SODIUM CHLORIDE 0.9% FLUSH: 9.0000 mL | INTRAVENOUS | Status: DC | PRN

## 2016-11-30 MED ORDER — NALOXONE HCL 0.4 MG/ML IJ SOLN: 0.4000 mg | INTRAMUSCULAR | Status: DC | PRN

## 2016-11-30 MED ORDER — SENNOSIDES-DOCUSATE SODIUM 8.6-50 MG PO TABS
1.0000 | ORAL_TABLET | Freq: Two times a day (BID) | ORAL | Status: DC
  Administered 2016-11-30: 1 via ORAL
  Filled 2016-11-30: qty 1

## 2016-11-30 MED ORDER — DIPHENHYDRAMINE HCL 50 MG/ML IJ SOLN
25.0000 mg | Freq: Once | INTRAMUSCULAR | Status: AC
  Administered 2016-11-30: 25 mg via INTRAVENOUS
  Filled 2016-11-30: qty 1

## 2016-11-30 MED ORDER — KETOROLAC TROMETHAMINE 30 MG/ML IJ SOLN
30.0000 mg | Freq: Four times a day (QID) | INTRAMUSCULAR | Status: DC
  Administered 2016-11-30: 30 mg via INTRAVENOUS
  Filled 2016-11-30: qty 1

## 2016-11-30 MED ORDER — HYDROMORPHONE HCL 2 MG/ML IJ SOLN: 2.0000 mg | INTRAMUSCULAR | Status: DC

## 2016-11-30 MED ORDER — POLYETHYLENE GLYCOL 3350 17 G PO PACK: 17.0000 g | PACK | Freq: Every day | ORAL | Status: DC | PRN

## 2016-11-30 MED ORDER — DEXTROSE-NACL 5-0.45 % IV SOLN
INTRAVENOUS | Status: DC
  Administered 2016-11-30: 09:00:00 via INTRAVENOUS

## 2016-11-30 MED ORDER — HYDROMORPHONE HCL 1 MG/ML IJ SOLN
0.5000 mg | Freq: Once | INTRAMUSCULAR | Status: AC
  Administered 2016-11-30: 0.5 mg via SUBCUTANEOUS
  Filled 2016-11-30: qty 1

## 2016-11-30 MED ORDER — DIPHENHYDRAMINE HCL 25 MG PO CAPS
25.0000 mg | ORAL_CAPSULE | ORAL | Status: DC | PRN
  Administered 2016-11-30: 25 mg via ORAL
  Filled 2016-11-30: qty 1

## 2016-11-30 MED ORDER — HYDROMORPHONE HCL 2 MG/ML IJ SOLN
2.0000 mg | INTRAMUSCULAR | Status: DC | PRN
  Administered 2016-11-30 (×2): 2 mg via INTRAVENOUS
  Filled 2016-11-30 (×2): qty 1

## 2016-11-30 MED ORDER — KETOROLAC TROMETHAMINE 30 MG/ML IJ SOLN
30.0000 mg | INTRAMUSCULAR | Status: AC
  Administered 2016-11-30: 30 mg via INTRAVENOUS
  Filled 2016-11-30: qty 1

## 2016-11-30 MED ORDER — HYDROMORPHONE 1 MG/ML IV SOLN
INTRAVENOUS | Status: DC
  Administered 2016-11-30: 25 mg via INTRAVENOUS
  Administered 2016-11-30: 8.4 mg via INTRAVENOUS
  Filled 2016-11-30: qty 25

## 2016-11-30 MED ORDER — HYDROMORPHONE HCL 2 MG/ML IJ SOLN
2.0000 mg | INTRAMUSCULAR | Status: AC
  Administered 2016-11-30: 2 mg via INTRAVENOUS
  Filled 2016-11-30: qty 1

## 2016-11-30 NOTE — Progress Notes (Signed)
Patient had 15 ml left in PCA syringe.

## 2016-11-30 NOTE — ED Provider Notes (Signed)
[place] DEPT Provider Note   CSN: [id number] Arrival date & time: 11/30/16  [id number]     History   Chief Complaint Chief Complaint  Patient presents with  . Sickle Cell Pain Crisis    HPI Carmen [name] is a 26 y.o. female.  HPI Patient states that she has the same ongoing pain from her sickle cell. She reports the pain is her entire body. He reports she just aches. She denies she has any fever. Reports she has had some sneezing but no other associated symptoms. No shortness of breath no productive cough no vomiting or diarrhea. Patient reports she has run out of her MS Contin and Percocet. She reports she can't get it refilled until next week. Past Medical History:  Diagnosis Date  . Abortion    05/2012  . Headache(784.0)   . Sickle cell crisis [facility])     Patient Active Problem List   Diagnosis Date Noted  . Skin abrasion   . Constipation   . Sickle cell anemia ([place]) 08/15/2016  . Breast lump 08/15/2016  . Anemia of chronic disease   . Acute URI 02/10/2016  . Sickle-cell crisis ([place]) 12/19/2015  . Sickle cell anemia with crisis ([place]) 12/19/2015  . Chest pain 11/10/2015  . 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 Sickle cell disease with crisis ([place]) 09/09/2015  . Dental caries   . Pain in a tooth or teeth   . Right facial swelling   . HCAP (healthcare-associated pneumonia) 02/28/2015  . Sickle cell crisis ([place]) 02/28/2015  . Sickle cell anemia with pain ([place]) 02/16/2015  . 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 Acute calculous cholecystitis s/p lap chole 11/30/2014 11/30/2014  . Hypokalemia 11/28/2014  . Atypical chest pain 11/28/2014  . Leukocytosis 11/22/2014  . Thrombocytosis ([place]) 11/22/2014  . Anemia 05/16/2014  . Migraine, unspecified, without mention of intractable migraine without mention of status migrainosus 05/16/2014  . Sickle cell pain crisis ([place]) 11/12/2013    Past Surgical History:  Procedure Laterality Date    . CHOLECYSTECTOMY N/A 11/30/2014   Procedure: LAPAROSCOPIC CHOLECYSTECTOMY SINGLE SITE WITH INTRAOPERATIVE CHOLANGIOGRAM;  Surgeon: Michael Boston, MD;  Location: [facility] ORS;  Service: General;  Laterality: N/A;  . SPLENECTOMY      OB History    Gravida Para Term Preterm AB Living   1       1     SAB TAB Ectopic Multiple Live Births                   Home Medications    Prior to Admission medications   Medication Sig Start Date End Date Taking? Authorizing Provider  morphine (MS CONTIN) 30 MG 12 hr tablet Take 1 tablet (30 mg total) by mouth every 12 (twelve) hours. Patient not taking: Reported on 11/30/2016 11/29/16   Margette Fast, MD  oxyCODONE-acetaminophen (PERCOCET) 10-325 MG tablet Take 1 tablet by mouth every 6 (six) hours as needed for pain. Patient not taking: Reported on 11/30/2016 10/31/16   Drenda Freeze, MD    Family History Family History  Problem Relation Age of Onset  . Hypertension Mother   . Sickle cell anemia Sister   . Kidney disease Sister     Lupus  . Arthritis Sister   . Sickle cell anemia Sister   . Sickle cell trait Sister   . Heart disease Maternal Aunt     CABG  . Heart disease Maternal Uncle  CABG  . Lupus Sister     Social History Social History  Substance Use Topics  . Smoking status: Never Smoker  . Smokeless tobacco: Never Used  . Alcohol use No     Allergies   Patient has no known allergies.   Review of Systems Review of Systems 10 Systems reviewed and are negative for acute change except as noted in the HPI.   Physical Exam Updated Vital Signs BP 121/83   Pulse 79   Temp 99 F (37.2 C) (Oral)   Resp 12   Ht 5\' 3"  (1.6 m)   Wt 170 lb (77.1 kg)   LMP 11/02/2016   SpO2 100%   BMI 30.11 kg/m   Physical Exam  Constitutional: She is oriented to person, place, and time. She appears well-developed and well-nourished. No distress.  HENT:  Head: Normocephalic and atraumatic.  Eyes: Conjunctivae are normal.  Neck:  Neck supple.  Cardiovascular: Normal rate and regular rhythm.   No murmur heard. Pulmonary/Chest: Effort normal and breath sounds normal. No respiratory distress.  Abdominal: Soft. She exhibits no distension. There is no tenderness. There is no guarding.  Musculoskeletal: She exhibits no edema, tenderness or deformity.  Neurological: She is alert and oriented to person, place, and time. She exhibits normal muscle tone. Coordination normal.  Skin: Skin is warm and dry.  Psychiatric: She has a normal mood and affect.  Nursing note and vitals reviewed.    ED Treatments / Results  Labs (all labs ordered are listed, but only abnormal results are displayed) Labs Reviewed  COMPREHENSIVE METABOLIC PANEL - Abnormal; Notable for the following:       Result Value   Total Bilirubin 1.9 (*)    All other components within normal limits  CBC WITH DIFFERENTIAL/PLATELET - Abnormal; Notable for the following:    RBC 3.02 (*)    Hemoglobin 8.8 (*)    HCT 24.3 (*)    MCHC 36.2 (*)    Platelets 615 (*)    Monocytes Absolute 1.3 (*)    All other components within normal limits  RETICULOCYTES - Abnormal; Notable for the following:    Retic Ct Pct 5.6 (*)    RBC. 3.02 (*)    All other components within normal limits    EKG  EKG Interpretation None       Radiology No results found.  Procedures Procedures (including critical care time)  Medications Ordered in ED Medications  dextrose 5 %-0.45 % sodium chloride infusion ( Intravenous New Bag/Given 11/30/16 0921)  ketorolac (TORADOL) 30 MG/ML injection 30 mg (not administered)  HYDROmorphone (DILAUDID) injection 2 mg (not administered)    Or  HYDROmorphone (DILAUDID) injection 2 mg (not administered)  HYDROmorphone (DILAUDID) injection 2 mg (not administered)    Or  HYDROmorphone (DILAUDID) injection 2 mg (not administered)  HYDROmorphone (DILAUDID) injection 2 mg (not administered)    Or  HYDROmorphone (DILAUDID) injection 2 mg (not  administered)  HYDROmorphone (DILAUDID) injection 0.5 mg (0.5 mg Subcutaneous Given 11/30/16 0827)  HYDROmorphone (DILAUDID) injection 2 mg (2 mg Intravenous Given 11/30/16 0921)    Or  HYDROmorphone (DILAUDID) injection 2 mg ( Subcutaneous See Alternative 11/30/16 0921)  diphenhydrAMINE (BENADRYL) injection 25 mg (25 mg Intravenous Given 11/30/16 [id number])     Initial Impression / Assessment and Plan / ED Course  I have reviewed the triage vital signs and the nursing notes.  Pertinent labs & imaging results that were available during my care of the patient were reviewed by me  and considered in my medical decision making (see chart for details).     Consult: Reviewed with Dr. Doreene Burke. Will except for treatment of the sickle cell clinic.  Final Clinical Impressions(s) / ED Diagnoses   Final diagnoses:  Sickle cell pain crisis [facility])   Patient presents with sickle cell pain. She is clinically well in appearance. Patient is stable. He has run out of her pain medications. She will be transferred to the sickle cell clinic for treatment of acute pain episode. New Prescriptions New Prescriptions   No medications on file     Charlesetta Shanks, MD 11/30/16 [phone]

## 2016-11-30 NOTE — Discharge Summary (Signed)
Physician Discharge Summary  MILISA LUU [id number] DOB: 05/10/1991 DOA: 11/30/2016  PCP: [name], MD  Admit date: 11/30/2016  Discharge date: 11/30/2016  Time spent: 30 minutes  Discharge Diagnoses:  Active Problems:   Sickle cell anemia with pain [facility])  Discharge Condition: Stable  Diet recommendation: Regular  History of present illness:  Carmen [name] is a 26 y.o. female with history of sickle cell disease transitioned to the day hospital from the ED this morning. She states that she has the same ongoing pain from her sickle cell for some days now. She reports the pain is in her entire body described as aching and throbbing. She denies she has any fever. Reports she has had some sneezing but no other associated symptoms. No shortness of breath, no productive cough no vomiting or diarrhea. Patient reports she has run out of her MS Contin and Percocet. She reports she can't get it refilled until next week from her PCP.  Hospital Course:  [name] was admitted to the day hospital with sickle cell painful crisis. Patient was treated with weight based IV Dilaudid PCA, IV Toradol as well as IV fluids. She also received clinician assisted doses of IV Dilaudid, 2 mg Q2H prn pain. She stated her pain was generalized. Her pain # remained at 10 even though patient was asleep most of the entire time of her observation. She received her discharge instructions with verbal understanding. She requested to be taken to the ED after discharge. Patient had no objective finding or symptom that would warrant inpatient admission. She was ambulating well without need for support or objective symptom of pain associated with any form of movement.   Discharge Instructions We discussed the need for good hydration, monitoring of hydration status, avoidance of heat, cold, stress, and infection triggers. We discussed the need to be compliant with taking Hydrea. [name] was reminded of the need to seek  medical attention of any symptoms of bleeding, anemia, or infection occurs.  Discharge Exam: Vitals:   11/30/16 1318 11/30/16 1424  BP: (!) 115/48 118/66  Pulse: 70 69  Resp: 12 11  Temp: 98.8 F (37.1 C) 98.5 F (36.9 C)   General appearance: alert, cooperative and no distress Eyes: conjunctivae/corneas clear. PERRL, EOM's intact. Fundi benign. Neck: no adenopathy, no carotid bruit, no JVD, supple, symmetrical, trachea midline and thyroid not enlarged, symmetric, no tenderness/mass/nodules Back: symmetric, no curvature. ROM normal. No CVA tenderness. Resp: clear to auscultation bilaterally Chest wall: no tenderness Cardio: regular rate and rhythm, S1, S2 normal, no murmur, click, rub or gallop GI: soft, non-tender; bowel sounds normal; no masses, no organomegaly Extremities: extremities normal, atraumatic, no cyanosis or edema Pulses: 2+ and symmetric Skin: Skin color, texture, turgor normal. No rashes or lesions Neurologic: Grossly normal  Discharge Instructions    Diet - low sodium heart healthy    Complete by:  As directed    Increase activity slowly    Complete by:  As directed      Discharge Medication List as of 11/30/2016  4:04 PM    CONTINUE these medications which have NOT CHANGED   Details  morphine (MS CONTIN) 30 MG 12 hr tablet Take 1 tablet (30 mg total) by mouth every 12 (twelve) hours., Starting Mon 11/29/2016, Print    oxyCODONE-acetaminophen (PERCOCET) 10-325 MG tablet Take 1 tablet by mouth every 6 (six) hours as needed for pain., Starting Sun 10/31/2016, Print       No Known Allergies  Significant Diagnostic  Studies: Dg Chest 2 View  Result Date: 11/28/2016 CLINICAL DATA:  26 year old female with cough. EXAM: CHEST  2 VIEW COMPARISON:  Chest radiograph dated 11/23/2016 and 08/31/2016 FINDINGS: Two views of the chest demonstrate stable area of density with blunting of the right posterior costophrenic angle on the lateral projection similar to the study  dating back to 08/31/2016. No new consolidative changes noted. There is no pleural effusion or pneumothorax. The cardiac silhouette is within normal limits with no acute osseous pathology. Right upper quadrant cholecystectomy clips. IMPRESSION: No active cardiopulmonary disease. Electronically Signed   By: Anner Crete M.D.   On: 11/28/2016 03:02   Dg Chest 2 View  Result Date: 11/23/2016 CLINICAL DATA:  26 year old female with sickle cell presenting with chest pain. EXAM: CHEST  2 VIEW COMPARISON:  Chest radiograph dated 08/31/2016 FINDINGS: The heart size and mediastinal contours are within normal limits. Both lungs are clear. The visualized skeletal structures are unremarkable. Right upper quadrant cholecystectomy clips. IMPRESSION: No active cardiopulmonary disease. Electronically Signed   By: Anner Crete M.D.   On: 11/23/2016 23:40    Signed:  Angelica Chessman MD, [place], [place], [place], CPE   11/30/2016, 6:55 PM

## 2016-11-30 NOTE — Progress Notes (Deleted)
Patient's PCA had 15 ml  left wasted in the sink.

## 2016-11-30 NOTE — ED Triage Notes (Signed)
Patient is complaining of generalized pain all over. She states pain started 3 weeks ago.  Denies SOB, N/V/or D. Denies chest pain.

## 2016-11-30 NOTE — H&P (Signed)
[facility] History and Physical  Carmen [name] [id number] DOB: 1991-05-14 DOA: 11/30/2016  PCP: [name], MD   Chief Complaint: Sickle Cell Pain  HPI: Carmen [name] is a 26 y.o. female with history of sickle cell disease transitioned to the day hospital from the ED this morning. She states that she has the same ongoing pain from her sickle cell for some days now. She reports the pain is in her entire body described as aching and throbbing. She denies she has any fever. Reports she has had some sneezing but no other associated symptoms. No shortness of breath, no productive cough no vomiting or diarrhea. Patient reports she has run out of her MS Contin and Percocet. She reports she can't get it refilled until next week from her PCP.  Systemic Review: General: The patient denies anorexia, fever, weight loss Cardiac: Denies chest pain, syncope, palpitations, pedal edema  Respiratory: Denies cough, shortness of breath, wheezing GI: Denies severe indigestion/heartburn, abdominal pain, nausea, vomiting, diarrhea and constipation GU: Denies hematuria, incontinence, dysuria  Musculoskeletal: Denies arthritis  Skin: Denies suspicious skin lesions Neurologic: Denies focal weakness or numbness, change in vision  Past Medical History:  Diagnosis Date  . Abortion    05/2012  . Headache(784.0)   . Sickle cell crisis [facility])     Past Surgical History:  Procedure Laterality Date  . CHOLECYSTECTOMY N/A 11/30/2014   Procedure: LAPAROSCOPIC CHOLECYSTECTOMY SINGLE SITE WITH INTRAOPERATIVE CHOLANGIOGRAM;  Surgeon: Michael Boston, MD;  Location: [facility] ORS;  Service: General;  Laterality: N/A;  . SPLENECTOMY      No Known Allergies  Family History  Problem Relation Age of Onset  . Hypertension Mother   . Sickle cell anemia Sister   . Kidney disease Sister     Lupus  . Arthritis Sister   . Sickle cell anemia Sister   . Sickle cell trait Sister   . Heart disease Maternal Aunt      CABG  . Heart disease Maternal Uncle     CABG  . Lupus Sister       Prior to Admission medications   Medication Sig Start Date End Date Taking? Authorizing Provider  morphine (MS CONTIN) 30 MG 12 hr tablet Take 1 tablet (30 mg total) by mouth every 12 (twelve) hours. 11/29/16  Yes Margette Fast, MD  oxyCODONE-acetaminophen (PERCOCET) 10-325 MG tablet Take 1 tablet by mouth every 6 (six) hours as needed for pain. 10/31/16  Yes Drenda Freeze, MD     Physical Exam: Vitals:   11/30/16 0958  BP: 108/88  Pulse: 83  Resp: 16  Temp: 99.3 F (37.4 C)  TempSrc: Oral  SpO2: 99%    General: Alert, awake, afebrile, anicteric, not in obvious distress HEENT: Normocephalic and Atraumatic, Mucous membranes pink                PERRLA; EOM intact; No scleral icterus,                 Nares: Patent, Oropharynx: Clear, Fair Dentition                 Neck: FROM, no cervical lymphadenopathy, thyromegaly, carotid bruit or JVD;  CHEST WALL: No tenderness  CHEST: Normal respiration, clear to auscultation bilaterally  HEART: Regular rate and rhythm; no murmurs rubs or gallops  BACK: No kyphosis or scoliosis; no CVA tenderness  ABDOMEN: Positive Bowel Sounds, soft, non-tender; no masses, no organomegaly EXTREMITIES: No cyanosis, clubbing, or edema SKIN:  no rash or ulceration  CNS: Alert and Oriented x 4, Nonfocal exam, CN 2-12 intact  Labs on Admission:  Basic Metabolic Panel:  Recent Labs Lab 11/23/16 2308 11/25/16 0848 11/27/16 0103 11/30/16 0819  NA 140 137 139 137  K 3.7 4.0 4.1 3.7  CL 109 108 111 107  CO2 24 24 23 24   GLUCOSE 97 97 94 95  BUN 12 12 11 10   CREATININE 0.36* 0.51 0.38* 0.58  CALCIUM 9.2 9.6 9.4 9.3   Liver Function Tests:  Recent Labs Lab 11/23/16 2308 11/25/16 0848 11/27/16 0103 11/30/16 0819  AST 30 25 26 23   ALT 32 27 19 19   ALKPHOS 56 58 61 58  BILITOT 1.4* 1.4* 1.4* 1.9*  PROT 8.2* 8.0 8.0 7.9  ALBUMIN 4.4 4.6 4.3 4.3   No results for  input(s): LIPASE, AMYLASE in the last 168 hours. No results for input(s): AMMONIA in the last 168 hours. CBC:  Recent Labs Lab 11/23/16 2308 11/25/16 0848 11/27/16 0103 11/30/16 0819  WBC 7.6 7.5 10.1 9.9  NEUTROABS 3.1 3.4 4.7 5.9  HGB 9.7* 9.5* 9.3* 8.8*  HCT 27.9* 26.5* 26.0* 24.3*  MCV 81.1 81.5 80.7 80.5  PLT 580* 610* 579* 615*   Cardiac Enzymes: No results for input(s): CKTOTAL, CKMB, CKMBINDEX, TROPONINI in the last 168 hours.  BNP (last 3 results) No results for input(s): BNP in the last 8760 hours.  ProBNP (last 3 results) No results for input(s): PROBNP in the last 8760 hours.  CBG: No results for input(s): GLUCAP in the last 168 hours.   Assessment/Plan Active Problems:   Sickle cell anemia with pain ([place])   Admits to the Day Hospital  IVF D5 .45% Saline @ 125 mls/hour  Weight based Dilaudid PCA started within 30 minutes of admission and clinician assisted doses of IV Dilaudid  IV Toradol 30 mg Q 6 H  Monitor vitals very closely, Re-evaluate pain scale every hour  2 L of Oxygen by [place]  Patient will be re-evaluated for pain in the context of function and relationship to baseline as care progresses.  If no significant relieve from pain (remains above 5/10) will transfer patient to inpatient services for further evaluation and management  Code Status: Full  Family Communication: None  DVT Prophylaxis: Ambulate as tolerated   Time spent: 9 Minutes  Hermelinda Diegel, MD, MHA, FACP, FAAP, CPE  If 7PM-7AM, please contact night-coverage [email or web address] 11/30/2016, 10:21 AM

## 2016-11-30 NOTE — Progress Notes (Signed)
Pt was transported from the ED via wheel chair with c/o of sickle cell pain. Pt was alert and oriented. She received Dilaudid pca, IV fluids, Toradol and Iv pushes of Dilaudid times 2. She stated her pain was generalized. Her pain # remain at 10 even though patient was asleep most of the time. She received her discharge instructions with verbal understanding. She requested to be taken to the ED after discharge.

## 2016-12-10 ENCOUNTER — Emergency Department (HOSPITAL_COMMUNITY): Payer: Medicaid Other

## 2016-12-10 ENCOUNTER — Emergency Department (HOSPITAL_COMMUNITY)
Admission: EM | Admit: 2016-12-10 | Discharge: 2016-12-10 | Disposition: A | Discharge status: Home / Self Care | Payer: Medicaid Other | Attending: Emergency Medicine | Admitting: Emergency Medicine

## 2016-12-10 ENCOUNTER — Encounter (HOSPITAL_COMMUNITY): Payer: Self-pay

## 2016-12-10 DIAGNOSIS — Z79899 Other long term (current) drug therapy: Secondary | ICD-10-CM | POA: Diagnosis not present

## 2016-12-10 DIAGNOSIS — D5700 Hb-SS disease with crisis, unspecified: Secondary | ICD-10-CM | POA: Diagnosis not present

## 2016-12-10 LAB — CBC WITH DIFFERENTIAL/PLATELET
BASOS PCT: 0 %
Basophils Absolute: 0 10*3/uL (ref 0.0–0.1)
Eosinophils Absolute: 0.1 10*3/uL (ref 0.0–0.7)
Eosinophils Relative: 1 %
HEMATOCRIT: 23.5 % — AB (ref 36.0–46.0)
HEMOGLOBIN: 8.4 g/dL — AB (ref 12.0–15.0)
LYMPHS ABS: 5.4 10*3/uL — AB (ref 0.7–4.0)
Lymphocytes Relative: 42 %
MCH: 30 pg (ref 26.0–34.0)
MCHC: 35.7 g/dL (ref 30.0–36.0)
MCV: 83.9 fL (ref 78.0–100.0)
MONOS PCT: 11 %
Monocytes Absolute: 1.4 10*3/uL — ABNORMAL HIGH (ref 0.1–1.0)
NEUTROS ABS: 6 10*3/uL (ref 1.7–7.7)
NEUTROS PCT: 46 %
Platelets: 471 10*3/uL — ABNORMAL HIGH (ref 150–400)
RBC: 2.8 MIL/uL — AB (ref 3.87–5.11)
RDW: 17.1 % — ABNORMAL HIGH (ref 11.5–15.5)
WBC: 12.9 10*3/uL — AB (ref 4.0–10.5)

## 2016-12-10 LAB — RETICULOCYTES
RBC.: 2.8 MIL/uL — ABNORMAL LOW (ref 3.87–5.11)
RETIC COUNT ABSOLUTE: 322 10*3/uL — AB (ref 19.0–186.0)
RETIC CT PCT: 11.5 % — AB (ref 0.4–3.1)

## 2016-12-10 LAB — COMPREHENSIVE METABOLIC PANEL
ALT: 20 U/L (ref 14–54)
ANION GAP: 7 (ref 5–15)
AST: 22 U/L (ref 15–41)
Albumin: 4 g/dL (ref 3.5–5.0)
Alkaline Phosphatase: 57 U/L (ref 38–126)
BUN: 10 mg/dL (ref 6–20)
CALCIUM: 8.9 mg/dL (ref 8.9–10.3)
CHLORIDE: 105 mmol/L (ref 101–111)
CO2: 27 mmol/L (ref 22–32)
Creatinine, Ser: 0.76 mg/dL (ref 0.44–1.00)
GFR calc non Af Amer: >60 mL/min (ref >60)
Glucose, Bld: 102 mg/dL — ABNORMAL HIGH (ref 65–99)
Potassium: 3.6 mmol/L (ref 3.5–5.1)
SODIUM: 139 mmol/L (ref 135–145)
Total Bilirubin: 1.5 mg/dL — ABNORMAL HIGH (ref 0.3–1.2)
Total Protein: 7.5 g/dL (ref 6.5–8.1)

## 2016-12-10 LAB — I-STAT TROPONIN, ED
Troponin i, poc: 0 ng/mL (ref 0.00–0.08)
Troponin i, poc: 0 ng/mL (ref 0.00–0.08)

## 2016-12-10 MED ORDER — HYDROMORPHONE HCL 2 MG/ML IJ SOLN: 2.0000 mg | INTRAMUSCULAR | Status: AC

## 2016-12-10 MED ORDER — HYDROMORPHONE HCL 2 MG/ML IJ SOLN
2.0000 mg | INTRAMUSCULAR | Status: AC
  Filled 2016-12-10: qty 1

## 2016-12-10 MED ORDER — HYDROMORPHONE HCL 2 MG/ML IJ SOLN
2.0000 mg | INTRAMUSCULAR | Status: AC
  Administered 2016-12-10: 2 mg via INTRAVENOUS
  Filled 2016-12-10: qty 1

## 2016-12-10 MED ORDER — HYDROMORPHONE HCL 2 MG/ML IJ SOLN
2.0000 mg | INTRAMUSCULAR | Status: AC
  Administered 2016-12-10: 2 mg via SUBCUTANEOUS

## 2016-12-10 MED ORDER — HYDROMORPHONE HCL 2 MG/ML IJ SOLN
2.0000 mg | Freq: Once | INTRAMUSCULAR | Status: AC
  Administered 2016-12-10: 2 mg via INTRAVENOUS
  Filled 2016-12-10: qty 1

## 2016-12-10 MED ORDER — DEXTROSE-NACL 5-0.45 % IV SOLN
INTRAVENOUS | Status: DC
  Administered 2016-12-10: 10:00:00 via INTRAVENOUS

## 2016-12-10 MED ORDER — DIPHENHYDRAMINE HCL 50 MG/ML IJ SOLN
25.0000 mg | Freq: Once | INTRAMUSCULAR | Status: AC
  Administered 2016-12-10: 25 mg via INTRAVENOUS
  Filled 2016-12-10: qty 1

## 2016-12-10 MED ORDER — ONDANSETRON HCL 4 MG/2ML IJ SOLN: 4.0000 mg | INTRAMUSCULAR | Status: DC | PRN

## 2016-12-10 NOTE — Discharge Instructions (Signed)
Please continue taking her pain medications at home. Please follow-up with your sickle cell doctor regarding today's visit. Make sure to drink at least 8 glasses of water throughout the day.  Get help right away if: You feel dizzy or faint. You have new abdominal pain, especially on the left side near the stomach area. You develop a persistent, often uncomfortable and painful penile erection (priapism). If this is not treated immediately it will lead to impotence. You have numbness your arms or legs or you have a hard time moving them. You have a hard time with speech. You have a fever or persistent symptoms for more than 2-3 days. You have a fever and your symptoms suddenly get worse. You have signs or symptoms of infection. These include: Chills. Abnormal tiredness (lethargy). Irritability. Poor eating. Vomiting. You develop pain that is not helped with medicine. You develop shortness of breath. You have pain in your chest. You are coughing up pus-like or bloody sputum. You develop a stiff neck. Your feet or hands swell or have pain. Your abdomen appears bloated. You develop joint pain.

## 2016-12-10 NOTE — ED Triage Notes (Signed)
Pt complains of general body aches that she believes is her sickle cell crisis and then she states that she has a cough ans a sore throat

## 2016-12-10 NOTE — ED Provider Notes (Signed)
[facility] DEPT Provider Note   CSN: [id number] Arrival date & time: 12/10/16  [id number]     History   Chief Complaint Chief Complaint  Patient presents with  . Sickle Cell Pain Crisis    HPI Carmen [name] is a 26 y.o. female with PMHx of sickle cell anemia and sickle cell pain crisis presents to the emergency department complaining of generalized body aches "all over" off and on for 1 month  But is not more intense with a sore throat since Monday. She states her body aches feel achy, sharp, and 10/10. She reports coming to the ED several times within the past month and he states it helped her symptoms enough for it to be tolerable.She states that these aches are not new and feel like previous, however today feels more intense than previous. She states the only new symptom is the sore throat that started Monday. The patient has associated productive cough with green and dark yellow color. She also reports sharp chest pain that she's not sure if is related to her sickle cell crisis. She states is localized to right lower chest underneath her right breast. She reports having acute chest syndrome and pneumonia in the past and not sure if it feels the same. She reports associated vomiting Monday, but has not vomited since then. The symptoms have been  constant, gradually worsened.  She reports nothing makes the symptoms worse and she reports her heating pad and hydration makes symptoms better. She also has been using her percocet and morphine at home with no relief. She percocet 1 every 4 hours and morphine ever 6 hours.  The patient denies fevers, abdominal pain. She reports previously declined admission in order to keep her job.    The history is provided by the patient. No language interpreter was used.  Sickle Cell Pain Crisis  Associated symptoms: chest pain, cough, shortness of breath (secondary to cough) and sore throat   Associated symptoms: no fever     Past Medical History:  Diagnosis  Date  . Abortion    05/2012  . Headache(784.0)   . Sickle cell crisis [facility])     Patient Active Problem List   Diagnosis Date Noted  . Skin abrasion   . Constipation   . Sickle cell anemia ([place]) 08/15/2016  . Breast lump 08/15/2016  . Anemia of chronic disease   . Acute URI 02/10/2016  . Sickle-cell crisis ([place]) 12/19/2015  . Sickle cell anemia with crisis ([place]) 12/19/2015  . Chest pain 11/10/2015  . 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 Sickle cell disease with crisis ([place]) 09/09/2015  . Dental caries   . Pain in a tooth or teeth   . Right facial swelling   . HCAP (healthcare-associated pneumonia) 02/28/2015  . Sickle cell crisis ([place]) 02/28/2015  . Sickle cell anemia with pain ([place]) 02/16/2015  . 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 Acute calculous cholecystitis s/p lap chole 11/30/2014 11/30/2014  . Hypokalemia 11/28/2014  . Atypical chest pain 11/28/2014  . Leukocytosis 11/22/2014  . Thrombocytosis ([place]) 11/22/2014  . Anemia 05/16/2014  . Migraine, unspecified, without mention of intractable migraine without mention of status migrainosus 05/16/2014  . Sickle cell pain crisis ([place]) 11/12/2013    Past Surgical History:  Procedure Laterality Date  . CHOLECYSTECTOMY N/A 11/30/2014   Procedure: LAPAROSCOPIC CHOLECYSTECTOMY SINGLE SITE WITH INTRAOPERATIVE CHOLANGIOGRAM;  Surgeon: Michael Boston, MD;  Location: [facility] OR[facility];  Service: General;  Laterality: N/A;  .  SPLENECTOMY      OB History    Gravida Para Term Preterm AB Living   1       1     SAB TAB Ectopic Multiple Live Births                   Home Medications    Prior to Admission medications   Medication Sig Start Date End Date Taking? Authorizing Provider  ibuprofen (ADVIL,MOTRIN) 800 MG tablet Take 800 mg by mouth every 8 (eight) hours as needed for mild pain or moderate pain.   Yes Historical Provider, MD  morphine (MS CONTIN) 60 MG 12 hr tablet Take 60 mg by mouth every 12  (twelve) hours.   Yes Historical Provider, MD  oxyCODONE-acetaminophen (PERCOCET) 10-325 MG tablet Take 1 tablet by mouth every 4 (four) hours as needed for pain.   Yes Historical Provider, MD    Family History Family History  Problem Relation Age of Onset  . Hypertension Mother   . Sickle cell anemia Sister   . Kidney disease Sister     Lupus  . Arthritis Sister   . Sickle cell anemia Sister   . Sickle cell trait Sister   . Heart disease Maternal Aunt     CABG  . Heart disease Maternal Uncle     CABG  . Lupus Sister     Social History Social History  Substance Use Topics  . Smoking status: Never Smoker  . Smokeless tobacco: Never Used  . Alcohol use No     Allergies   Patient has no known allergies.   Review of Systems Review of Systems  Constitutional: Negative for chills and fever.  HENT: Positive for sore throat.   Respiratory: Positive for cough and shortness of breath (secondary to cough).   Cardiovascular: Positive for chest pain.  Gastrointestinal: Negative for abdominal pain and diarrhea.  Genitourinary: Negative for difficulty urinating and dysuria.  Musculoskeletal: Negative for neck pain.  Skin: Negative for rash and wound.  All other systems reviewed and are negative.    Physical Exam Updated Vital Signs BP 125/73   Pulse 69   Temp 98.1 F (36.7 C) (Oral)   Resp 14   LMP 12/07/2016   SpO2 95%   Physical Exam  Constitutional: She is oriented to person, place, and time. She appears well-developed and well-nourished.  Well appearing  HENT:  Head: Normocephalic and atraumatic.  Nose: Nose normal.  Mouth/Throat: Oropharynx is clear and moist.  Oropharynx clear, nonerythematous, no exudates. tonsills are not swollen, red, or evidence of exudates.   Eyes: EOM are normal. Pupils are equal, round, and reactive to light.  Neck: Normal range of motion. No tracheal deviation present.  Cardiovascular: Normal rate and normal heart sounds.     Pulmonary/Chest: Effort normal and breath sounds normal. No respiratory distress.  Abdominal: Soft.  Soft and nontender. No guarding or rebound.   Musculoskeletal:  Tenderness to neck. No neck stiffness or nuchal rigidity noted. Tenderness to right chest, lower ribs underneath right breast. No obvious wound, redness, swelling to the area.  Neurological: She is alert and oriented to person, place, and time.  Skin: Skin is warm.  Psychiatric: She has a normal mood and affect. Her behavior is normal.  Nursing note and vitals reviewed.    ED Treatments / Results  Labs (all labs ordered are listed, but only abnormal results are displayed) Labs Reviewed  RETICULOCYTES - Abnormal; Notable for the following:  Result Value   Retic Ct Pct 11.5 (*)    RBC. 2.80 (*)    Retic Count, Manual 322.0 (*)    All other components within normal limits  COMPREHENSIVE METABOLIC PANEL - Abnormal; Notable for the following:    Glucose, Bld 102 (*)    Total Bilirubin 1.5 (*)    All other components within normal limits  CBC WITH DIFFERENTIAL/PLATELET - Abnormal; Notable for the following:    WBC 12.9 (*)    RBC 2.80 (*)    Hemoglobin 8.4 (*)    HCT 23.5 (*)    RDW 17.1 (*)    Platelets 471 (*)    Lymphs Abs 5.4 (*)    Monocytes Absolute 1.4 (*)    All other components within normal limits  I-STAT TROPOININ, ED  I-STAT TROPOININ, ED    EKG  EKG Interpretation None       Radiology Dg Chest 2 View  Result Date: 12/10/2016 CLINICAL DATA:  Chest pain, cough and sore throat for 5 days in a patient with sickle cell disease. EXAM: CHEST  2 VIEW COMPARISON:  PA and lateral chest 11/28/2016 and 11/23/2016 FINDINGS: Lungs are clear. Heart size is normal. No pneumothorax or pleural effusion. No acute bony abnormality. Status post cholecystectomy. IMPRESSION: No acute disease. Electronically Signed   By: Inge Rise M.D.   On: 12/10/2016 07:59    Procedures Procedures (including critical  care time)  Medications Ordered in ED Medications  dextrose 5 %-0.45 % sodium chloride infusion ( Intravenous New Bag/Given 12/10/16 1018)  HYDROmorphone (DILAUDID) injection 2 mg (not administered)    Or  HYDROmorphone (DILAUDID) injection 2 mg (not administered)  ondansetron (ZOFRAN) injection 4 mg (not administered)  HYDROmorphone (DILAUDID) injection 2 mg (2 mg Intravenous Given 12/10/16 1003)    Or  HYDROmorphone (DILAUDID) injection 2 mg ( Subcutaneous See Alternative 12/10/16 1003)  HYDROmorphone (DILAUDID) injection 2 mg ( Intravenous See Alternative 12/10/16 1107)    Or  HYDROmorphone (DILAUDID) injection 2 mg (2 mg Subcutaneous Given 12/10/16 1107)  diphenhydrAMINE (BENADRYL) injection 25 mg (25 mg Intravenous Given 12/10/16 1003)  HYDROmorphone (DILAUDID) injection 2 mg (2 mg Intravenous Given 12/10/16 1201)  HYDROmorphone (DILAUDID) injection 2 mg (2 mg Intravenous Given 12/10/16 1331)     Initial Impression / Assessment and Plan / ED Course  I have reviewed the triage vital signs and the nursing notes.  Pertinent labs & imaging results that were available during my care of the patient were reviewed by me and considered in my medical decision making (see chart for details).    Patient presents today with similar symptoms of sickle cell crisis. On exam patient is afebrile, no apparent distress, vital signs stable. Heart and lung sounds clear. Abdomen soft and nontender. Area lower right chest underneath the breast mild to moderately tender to palpation.  Chest x-ray is negative for any acute findings. Troponin negative x 2. EKG shows no acute findings. Lab work shows similar previous findings. Nonspecific leukocytosis.  11:50 on reassessment patient starting third dose of Dilaudid and stated she was feeling better. She's had she thinks that after the third dose she will be able to go back home and manage pain at home.  13:50  Patient felt better and feels that she is able to go  home.  Patient is no apparent distress, afebrile, vital signs stable. I feel safe for discharge at this time. Patient given instructions to follow up with her sickle cell doctor regarding today's visit within  5 days. Specific instructions and reasons to return immediately to the ED discussed.    Final Clinical Impressions(s) / ED Diagnoses   Final diagnoses:  Sickle cell crisis [facility])    New Prescriptions New Prescriptions   No medications on file     [place], [place] 12/10/16 [place], MD 12/13/16 [phone]

## 2016-12-10 NOTE — ED Notes (Signed)
Patient transported to X-ray 

## 2016-12-10 NOTE — ED Notes (Signed)
Main lab notified regarding need for lab draw.

## 2016-12-10 NOTE — ED Notes (Signed)
Main lab in the patient's room to draw I stat Troponin. Patient stated to lab tech that she was romised one more dose of pain medication and she will not get her blood drawn until she gets the pain med. Pilar Plate, EDPA notified and stated that he did not promise patient any more pain meds, but would address it.

## 2016-12-10 NOTE — ED Notes (Signed)
Patient came out of the room after hearing writer talking to the IV Team regarding IV Team going to another room first. Writer stated to the IV Team that it was OK if she went into the other room because the Patient refused Pain med to be given [place]. Patient demanded to talk to "someone of importance." Writer told of incident to the charge nurse and to the AD

## 2016-12-10 NOTE — ED Notes (Signed)
Attempted once for iSTAT unsuccessful

## 2016-12-10 NOTE — ED Notes (Signed)
Attempted x 3 to start IV by 2 different staff members.

## 2016-12-10 NOTE — ED Notes (Signed)
ED charge unable to do [place] IV. EDPA notified. Patient refused [place] Dilaudid.

## 2016-12-21 ENCOUNTER — Emergency Department (HOSPITAL_COMMUNITY)
Admission: EM | Admit: 2016-12-21 | Discharge: 2016-12-21 | Disposition: A | Discharge status: Home / Self Care | Payer: Medicaid Other | Attending: Emergency Medicine | Admitting: Emergency Medicine

## 2016-12-21 ENCOUNTER — Emergency Department (HOSPITAL_COMMUNITY): Payer: Medicaid Other

## 2016-12-21 DIAGNOSIS — Z79899 Other long term (current) drug therapy: Secondary | ICD-10-CM | POA: Diagnosis not present

## 2016-12-21 DIAGNOSIS — D5700 Hb-SS disease with crisis, unspecified: Secondary | ICD-10-CM | POA: Insufficient documentation

## 2016-12-21 LAB — BASIC METABOLIC PANEL
ANION GAP: 6 (ref 5–15)
BUN: 13 mg/dL (ref 6–20)
CHLORIDE: 105 mmol/L (ref 101–111)
CO2: 26 mmol/L (ref 22–32)
Calcium: 9.9 mg/dL (ref 8.9–10.3)
Creatinine, Ser: 0.49 mg/dL (ref 0.44–1.00)
GFR calc non Af Amer: >60 mL/min (ref >60)
Glucose, Bld: 96 mg/dL (ref 65–99)
POTASSIUM: 3.7 mmol/L (ref 3.5–5.1)
Sodium: 137 mmol/L (ref 135–145)

## 2016-12-21 LAB — CBC WITH DIFFERENTIAL/PLATELET
Basophils Absolute: 0.1 10*3/uL (ref 0.0–0.1)
Basophils Relative: 0 %
Eosinophils Absolute: 0.1 10*3/uL (ref 0.0–0.7)
Eosinophils Relative: 0 %
HEMATOCRIT: 27.1 % — AB (ref 36.0–46.0)
HEMOGLOBIN: 9.7 g/dL — AB (ref 12.0–15.0)
LYMPHS ABS: 4.3 10*3/uL — AB (ref 0.7–4.0)
Lymphocytes Relative: 31 %
MCH: 29.1 pg (ref 26.0–34.0)
MCHC: 35.8 g/dL (ref 30.0–36.0)
MCV: 81.4 fL (ref 78.0–100.0)
MONOS PCT: 10 %
Monocytes Absolute: 1.5 10*3/uL — ABNORMAL HIGH (ref 0.1–1.0)
NEUTROS PCT: 59 %
Neutro Abs: 8.3 10*3/uL — ABNORMAL HIGH (ref 1.7–7.7)
Platelets: 626 10*3/uL — ABNORMAL HIGH (ref 150–400)
RBC: 3.33 MIL/uL — ABNORMAL LOW (ref 3.87–5.11)
RDW: 14.8 % (ref 11.5–15.5)
WBC: 14.2 10*3/uL — ABNORMAL HIGH (ref 4.0–10.5)

## 2016-12-21 LAB — RETICULOCYTES
RBC.: 3.33 MIL/uL — ABNORMAL LOW (ref 3.87–5.11)
RETIC CT PCT: 6.1 % — AB (ref 0.4–3.1)
Retic Count, Absolute: 203.1 10*3/uL — ABNORMAL HIGH (ref 19.0–186.0)

## 2016-12-21 LAB — POC URINE PREG, ED: PREG TEST UR: NEGATIVE

## 2016-12-21 MED ORDER — SODIUM CHLORIDE 0.45 % IV SOLN
INTRAVENOUS | Status: DC
  Administered 2016-12-21: 12:00:00 via INTRAVENOUS

## 2016-12-21 MED ORDER — HYDROMORPHONE HCL 1 MG/ML IJ SOLN
1.0000 mg | Freq: Once | INTRAMUSCULAR | Status: AC
  Administered 2016-12-21: 1 mg via INTRAVENOUS
  Filled 2016-12-21: qty 1

## 2016-12-21 MED ORDER — ONDANSETRON HCL 4 MG/2ML IJ SOLN: 4.0000 mg | INTRAMUSCULAR | Status: DC | PRN

## 2016-12-21 MED ORDER — HYDROMORPHONE HCL 2 MG/ML IJ SOLN
2.0000 mg | INTRAMUSCULAR | Status: AC
  Administered 2016-12-21: 2 mg via INTRAVENOUS

## 2016-12-21 MED ORDER — HYDROMORPHONE HCL 2 MG/ML IJ SOLN
2.0000 mg | INTRAMUSCULAR | Status: AC
  Filled 2016-12-21: qty 1

## 2016-12-21 MED ORDER — HYDROMORPHONE HCL 2 MG/ML IJ SOLN
2.0000 mg | INTRAMUSCULAR | Status: AC
  Administered 2016-12-21: 2 mg via INTRAVENOUS
  Filled 2016-12-21: qty 1

## 2016-12-21 MED ORDER — HYDROMORPHONE HCL 1 MG/ML IJ SOLN
1.0000 mg | INTRAMUSCULAR | Status: AC
  Administered 2016-12-21: 1 mg via INTRAVENOUS
  Filled 2016-12-21: qty 1

## 2016-12-21 MED ORDER — HYDROMORPHONE HCL 2 MG/ML IJ SOLN: 2.0000 mg | INTRAMUSCULAR | Status: AC

## 2016-12-21 MED ORDER — DIPHENHYDRAMINE HCL 50 MG/ML IJ SOLN
25.0000 mg | Freq: Once | INTRAMUSCULAR | Status: AC
  Administered 2016-12-21: 25 mg via INTRAVENOUS
  Filled 2016-12-21: qty 1

## 2016-12-21 MED ORDER — KETOROLAC TROMETHAMINE 15 MG/ML IJ SOLN
15.0000 mg | INTRAMUSCULAR | Status: AC
  Administered 2016-12-21: 15 mg via INTRAVENOUS
  Filled 2016-12-21: qty 1

## 2016-12-21 NOTE — ED Notes (Signed)
PT need recollect LT tube. RN have been made aware

## 2016-12-21 NOTE — ED Triage Notes (Signed)
Pt reports awaken from sleep with back pain unresolved byRx at home

## 2016-12-21 NOTE — ED Notes (Signed)
Patient transported to X-ray 

## 2016-12-21 NOTE — ED Notes (Signed)
Patient given ginger ale. 

## 2016-12-21 NOTE — Discharge Instructions (Signed)
Please continue her home pain regimen. Follow-up with her primary care doctor. Return to ED if your symptoms worsen.

## 2016-12-21 NOTE — ED Notes (Signed)
PA at bedside.

## 2016-12-21 NOTE — ED Notes (Signed)
Patient refusing additional lab work and EKG stating "unless he is going to give me more pain medication for having to wait I want to leave." PA made aware.

## 2016-12-21 NOTE — ED Provider Notes (Signed)
[facility] DEPT Provider Note   CSN: [id number] Arrival date & time: 12/21/16  [id number]     History   Chief Complaint Chief Complaint  Patient presents with  . Sickle Cell Pain Crisis    HPI Carmen [name] is a 26 y.o. female.  HPI  26 year old African-American female with a past medical history significant for sickle cell anemia and sickle cell pain crisis. She's been seen in the ED multiple times for same. She is coming to the ED today with complaints of sickle cell pain. She complains of back pain starting from her upper back down to her lower back. The pain has been ongoing for 2 months and has progressively worsened. She has tried her home dose of Percocet and morphine with little relief. States is like her typical pain. She endorses a slight cough with mucus production. She also endorses sneezing and rhinorrhea. She denies any sore throat. Pain is non pleuritic. Denies any chest pain or shortness of breath. Patient states that movement and touch makes the pain worse. When she lied down the pain slightly improved. Patient denies any fever, chills, headache, vision changes, lightheadedness, dizziness, chest pain, shortness breath, abdominal pain, nausea, emesis, urinary symptoms, vaginal symptoms, change in bowel habits, paresthesias.   Past Medical History:  Diagnosis Date  . Abortion    05/2012  . Headache(784.0)   . Sickle cell crisis [facility])     Patient Active Problem List   Diagnosis Date Noted  . Skin abrasion   . Constipation   . Sickle cell anemia ([place]) 08/15/2016  . Breast lump 08/15/2016  . Anemia of chronic disease   . Acute URI 02/10/2016  . Sickle-cell crisis ([place]) 12/19/2015  . Sickle cell anemia with crisis ([place]) 12/19/2015  . Chest pain 11/10/2015  . 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 Sickle cell disease with crisis ([place]) 09/09/2015  . Dental caries   . Pain in a tooth or teeth   . Right facial swelling   . HCAP (healthcare-associated  pneumonia) 02/28/2015  . Sickle cell crisis ([place]) 02/28/2015  . Sickle cell anemia with pain ([place]) 02/16/2015  . 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 Acute calculous cholecystitis s/p lap chole 11/30/2014 11/30/2014  . Hypokalemia 11/28/2014  . Atypical chest pain 11/28/2014  . Leukocytosis 11/22/2014  . Thrombocytosis ([place]) 11/22/2014  . Anemia 05/16/2014  . Migraine, unspecified, without mention of intractable migraine without mention of status migrainosus 05/16/2014  . Sickle cell pain crisis ([place]) 11/12/2013    Past Surgical History:  Procedure Laterality Date  . CHOLECYSTECTOMY N/A 11/30/2014   Procedure: LAPAROSCOPIC CHOLECYSTECTOMY SINGLE SITE WITH INTRAOPERATIVE CHOLANGIOGRAM;  Surgeon: Michael Boston, MD;  Location: [facility] ORS;  Service: General;  Laterality: N/A;  . SPLENECTOMY      OB History    Gravida Para Term Preterm AB Living   1       1     SAB TAB Ectopic Multiple Live Births                   Home Medications    Prior to Admission medications   Medication Sig Start Date End Date Taking? Authorizing Provider  ibuprofen (ADVIL,MOTRIN) 800 MG tablet Take 800 mg by mouth every 8 (eight) hours as needed for mild pain or moderate pain.   Yes Historical Provider, MD  morphine (MS CONTIN) 60 MG 12 hr tablet Take 60 mg by mouth every 12 (twelve) hours.   Yes  Historical Provider, MD  oxyCODONE-acetaminophen (PERCOCET) 10-325 MG tablet Take 1 tablet by mouth every 4 (four) hours as needed for pain.   Yes Historical Provider, MD    Family History Family History  Problem Relation Age of Onset  . Hypertension Mother   . Sickle cell anemia Sister   . Kidney disease Sister     Lupus  . Arthritis Sister   . Sickle cell anemia Sister   . Sickle cell trait Sister   . Heart disease Maternal Aunt     CABG  . Heart disease Maternal Uncle     CABG  . Lupus Sister     Social History Social History  Substance Use Topics  . Smoking status:  Never Smoker  . Smokeless tobacco: Never Used  . Alcohol use No     Allergies   Patient has no known allergies.   Review of Systems Review of Systems Constitutional: Negative for chills and fever.  HENT: Positive for congestion and sneezing. Negative for sore throat.   Eyes: Negative for visual disturbance.  Respiratory: Positive for cough. Negative for shortness of breath and wheezing.   Cardiovascular: Negative for chest pain.  Gastrointestinal: Negative for abdominal pain, diarrhea, nausea and vomiting.  Genitourinary: Negative for dysuria, frequency, hematuria and urgency.  Musculoskeletal: Positive for back pain.  Skin: Negative.   Neurological: Negative for dizziness, syncope, light-headedness, numbness and headaches.  All other systems reviewed and are negative.  Physical Exam Updated Vital Signs BP 114/70   Pulse 71   Temp 98.6 F (37 C) (Oral)   Resp 18   Ht 5\' 3"  (1.6 m)   Wt 77.1 kg   LMP 12/07/2016   SpO2 96%   BMI 30.11 kg/m   Physical Exam  Constitutional: She is oriented to person, place, and time. She appears well-developed and well-nourished. No distress.  Patient is nontoxic appearing. She is in no acute distress. She is planning on her cell phone.  HENT:  Head: Normocephalic and atraumatic.  Right Ear: Tympanic membrane, external ear and ear canal normal.  Left Ear: Tympanic membrane, external ear and ear canal normal.  Nose: Mucosal edema and rhinorrhea present.  Mouth/Throat: Uvula is midline, oropharynx is clear and moist and mucous membranes are normal.  Eyes: Conjunctivae and EOM are normal. Pupils are equal, round, and reactive to light. Right eye exhibits no discharge. Left eye exhibits no discharge. No scleral icterus.  Neck: Normal range of motion. Neck supple. No thyromegaly present.  Cardiovascular: Normal rate, regular rhythm, normal heart sounds and intact distal pulses.  Exam reveals no gallop and no friction rub.   No murmur  heard. Pulmonary/Chest: Effort normal and breath sounds normal. No respiratory distress. She has no wheezes. She has no rales. She exhibits no tenderness.  CTAB. No chest wall tenderness.  Abdominal: Soft. Bowel sounds are normal. She exhibits no distension. There is no tenderness. There is no rebound and no guarding.  Musculoskeletal: Normal range of motion.       Arms: Lymphadenopathy:    She has no cervical adenopathy.  Neurological: She is alert and oriented to person, place, and time.  Skin: Skin is warm and dry. Capillary refill takes less than 2 seconds.  Nursing note and vitals reviewed.    ED Treatments / Results  Labs (all labs ordered are listed, but only abnormal results are displayed) Labs Reviewed  CBC WITH DIFFERENTIAL/PLATELET - Abnormal; Notable for the following:       Result Value  WBC 14.2 (*)    RBC 3.33 (*)    Hemoglobin 9.7 (*)    HCT 27.1 (*)    Platelets 626 (*)    Neutro Abs 8.3 (*)    Lymphs Abs 4.3 (*)    Monocytes Absolute 1.5 (*)    All other components within normal limits  RETICULOCYTES - Abnormal; Notable for the following:    Retic Ct Pct 6.1 (*)    RBC. 3.33 (*)    Retic Count, Manual 203.1 (*)    All other components within normal limits  BASIC METABOLIC PANEL  POC URINE PREG, ED  I-STAT TROPOININ, ED    EKG  EKG Interpretation None       Radiology Dg Chest 2 View  Result Date: 12/21/2016 CLINICAL DATA:  Sickle cell pain crisis.  Mid and upper back pain. EXAM: CHEST  2 VIEW COMPARISON:  12/10/2016. FINDINGS: The heart size and mediastinal contours are within normal limits. Both lungs are clear. The visualized skeletal structures are unremarkable. IMPRESSION: No active cardiopulmonary disease. Electronically Signed   By: Markus Daft M.D.   On: 12/21/2016 11:29    Procedures Procedures (including critical care time)  Medications Ordered in ED Medications  0.45 % sodium chloride infusion ( Intravenous Stopped 12/21/16 1430)   ondansetron (ZOFRAN) injection 4 mg (not administered)  HYDROmorphone (DILAUDID) injection 1 mg (1 mg Intravenous Given 12/21/16 1147)  ketorolac (TORADOL) 15 MG/ML injection 15 mg (15 mg Intravenous Given 12/21/16 1220)  HYDROmorphone (DILAUDID) injection 1 mg (1 mg Intravenous Given 12/21/16 1253)    Or  HYDROmorphone (DILAUDID) injection 2 mg ( Subcutaneous See Alternative 12/21/16 1253)  HYDROmorphone (DILAUDID) injection 2 mg (2 mg Intravenous Given 12/21/16 1333)    Or  HYDROmorphone (DILAUDID) injection 2 mg ( Subcutaneous See Alternative 12/21/16 1333)  HYDROmorphone (DILAUDID) injection 2 mg (2 mg Intravenous Given 12/21/16 1216)    Or  HYDROmorphone (DILAUDID) injection 2 mg ( Subcutaneous See Alternative 12/21/16 1216)  diphenhydrAMINE (BENADRYL) injection 25 mg (25 mg Intravenous Given 12/21/16 1211)     Initial Impression / Assessment and Plan / ED Course  I have reviewed the triage vital signs and the nursing notes.  Pertinent labs & imaging results that were available during my care of the patient were reviewed by me and considered in my medical decision making (see chart for details).     Patient resents to the ED today with complaint of her typical sickle cell pain. Describes pain along the entire length of her back. Home medication and an unsuccessful controlling her pain. Patient is afebrile, not tachycardic, not tachypenic, or hypoxic. Patient exam is benign except for mild tenderness to palpation of the paraspinal muscles of the back. Chest x-rays shows no acute infiltrate concerning for pneumonia. She denies any chest pain or shortness of breath. Patient refused EKG or troponin. Hemoglobin is 9.7 which is baseline for patient. Patient reticulocyte count is mildly elevated. Blood work is similar to past. Patient has had a known thrombocytosis. The patient is a nonspecific leukocytosis. No other infectious symptoms. Patient was reassessed on third dose of Dilaudid and states her  pain slightly improved before like she 1 more dose. She was given her fourth dose and reassessed. Patient states that the pain eases off but then returns. States it is slightly improved from when she came in. On reassessment every time patient is text in on her cell phone. Patient continues to refuse EKG or troponin. Doubt ACS process given that this is  patient's typical pain. Doubt PE given the patient is not hypoxic or tachypnea. She is not tachycardic. Offered patient admission but she states that she adamantly refuses admission. States that she feels like she can go back home and manage pain at home with her home dose of pain medication. Will return to the ED if she is unable to manage pain. States she is ready to be discharged. Again encouraged admission and she refused. Patient appears to be in no acute distress and she is afebrile. Vital signs are stable. I feel patient is safe for discharge at this time. Have given the patient instructions to follow-up with the sickle cell clinic. Also encouraged to follow with primary care doctor. Given her strict return precautions. She is in no acute distress on discharge.    Final Clinical Impressions(s) / ED Diagnoses   Final diagnoses:  Sickle cell pain crisis [facility])    New Prescriptions Discharge Medication List as of 12/21/2016  2:23 PM       Doristine Devoid, PA-C 12/21/16 [place], MD 12/21/16 [phone]

## 2016-12-21 NOTE — ED Notes (Signed)
Unsuccessful IV and blood draw attempt x 2.  Will consult IV team.

## 2016-12-24 ENCOUNTER — Encounter (HOSPITAL_COMMUNITY): Payer: Self-pay

## 2016-12-24 ENCOUNTER — Inpatient Hospital Stay (HOSPITAL_COMMUNITY)
Admission: EM | Admit: 2016-12-24 | Discharge: 2016-12-28 | DRG: 812 | Disposition: A | Discharge status: Home / Self Care | Payer: Medicaid Other | Attending: Internal Medicine | Admitting: Internal Medicine

## 2016-12-24 DIAGNOSIS — Z79891 Long term (current) use of opiate analgesic: Secondary | ICD-10-CM | POA: Diagnosis not present

## 2016-12-24 DIAGNOSIS — E876 Hypokalemia: Secondary | ICD-10-CM | POA: Diagnosis present

## 2016-12-24 DIAGNOSIS — D5700 Hb-SS disease with crisis, unspecified: Principal | ICD-10-CM | POA: Diagnosis present

## 2016-12-24 DIAGNOSIS — Z9081 Acquired absence of spleen: Secondary | ICD-10-CM

## 2016-12-24 DIAGNOSIS — Z8261 Family history of arthritis: Secondary | ICD-10-CM

## 2016-12-24 DIAGNOSIS — Z9049 Acquired absence of other specified parts of digestive tract: Secondary | ICD-10-CM

## 2016-12-24 DIAGNOSIS — R7989 Other specified abnormal findings of blood chemistry: Secondary | ICD-10-CM | POA: Diagnosis present

## 2016-12-24 DIAGNOSIS — Z8249 Family history of ischemic heart disease and other diseases of the circulatory system: Secondary | ICD-10-CM | POA: Diagnosis not present

## 2016-12-24 DIAGNOSIS — Z841 Family history of disorders of kidney and ureter: Secondary | ICD-10-CM | POA: Diagnosis not present

## 2016-12-24 DIAGNOSIS — Z832 Family history of diseases of the blood and blood-forming organs and certain disorders involving the immune mechanism: Secondary | ICD-10-CM

## 2016-12-24 LAB — RETICULOCYTES
RBC.: 3.09 MIL/uL — ABNORMAL LOW (ref 3.87–5.11)
RETIC CT PCT: 5.5 % — AB (ref 0.4–3.1)
Retic Count, Absolute: 170 10*3/uL (ref 19.0–186.0)

## 2016-12-24 LAB — CBC WITH DIFFERENTIAL/PLATELET
BASOS ABS: 0 10*3/uL (ref 0.0–0.1)
Basophils Relative: 0 %
EOS ABS: 0.1 10*3/uL (ref 0.0–0.7)
EOS PCT: 1 %
HCT: 25.1 % — ABNORMAL LOW (ref 36.0–46.0)
Hemoglobin: 8.9 g/dL — ABNORMAL LOW (ref 12.0–15.0)
LYMPHS ABS: 4.8 10*3/uL — AB (ref 0.7–4.0)
LYMPHS PCT: 52 %
MCH: 28.8 pg (ref 26.0–34.0)
MCHC: 35.5 g/dL (ref 30.0–36.0)
MCV: 81.2 fL (ref 78.0–100.0)
MONO ABS: 1.3 10*3/uL — AB (ref 0.1–1.0)
Monocytes Relative: 13 %
Neutro Abs: 3.2 10*3/uL (ref 1.7–7.7)
Neutrophils Relative %: 34 %
PLATELETS: 628 10*3/uL — AB (ref 150–400)
RBC: 3.09 MIL/uL — AB (ref 3.87–5.11)
RDW: 14.7 % (ref 11.5–15.5)
WBC: 9.3 10*3/uL (ref 4.0–10.5)

## 2016-12-24 LAB — COMPREHENSIVE METABOLIC PANEL
ALBUMIN: 4.4 g/dL (ref 3.5–5.0)
ALT: 18 U/L (ref 14–54)
AST: 22 U/L (ref 15–41)
Alkaline Phosphatase: 54 U/L (ref 38–126)
Anion gap: 9 (ref 5–15)
BILIRUBIN TOTAL: 2.3 mg/dL — AB (ref 0.3–1.2)
BUN: 10 mg/dL (ref 6–20)
CO2: 25 mmol/L (ref 22–32)
CREATININE: 0.61 mg/dL (ref 0.44–1.00)
Calcium: 9.4 mg/dL (ref 8.9–10.3)
Chloride: 109 mmol/L (ref 101–111)
GFR calc Af Amer: >60 mL/min (ref >60)
GLUCOSE: 95 mg/dL (ref 65–99)
POTASSIUM: 3.4 mmol/L — AB (ref 3.5–5.1)
Sodium: 143 mmol/L (ref 135–145)
TOTAL PROTEIN: 8.1 g/dL (ref 6.5–8.1)

## 2016-12-24 LAB — PREGNANCY, URINE: Preg Test, Ur: NEGATIVE

## 2016-12-24 MED ORDER — HYDROMORPHONE HCL 2 MG/ML IJ SOLN: 2.0000 mg | INTRAMUSCULAR | Status: DC

## 2016-12-24 MED ORDER — DEXTROSE-NACL 5-0.45 % IV SOLN
INTRAVENOUS | Status: DC
  Administered 2016-12-24 – 2016-12-27 (×7): via INTRAVENOUS
  Administered 2016-12-27 (×2): 1000 mL via INTRAVENOUS
  Administered 2016-12-28: 08:00:00 via INTRAVENOUS

## 2016-12-24 MED ORDER — MORPHINE SULFATE ER 30 MG PO TBCR
60.0000 mg | EXTENDED_RELEASE_TABLET | Freq: Two times a day (BID) | ORAL | Status: DC
  Administered 2016-12-24 – 2016-12-28 (×9): 60 mg via ORAL
  Filled 2016-12-24 (×9): qty 2

## 2016-12-24 MED ORDER — ENOXAPARIN SODIUM 40 MG/0.4ML ~~LOC~~ SOLN
40.0000 mg | SUBCUTANEOUS | Status: DC
  Filled 2016-12-24 (×4): qty 0.4

## 2016-12-24 MED ORDER — KETOROLAC TROMETHAMINE 30 MG/ML IJ SOLN
30.0000 mg | Freq: Four times a day (QID) | INTRAMUSCULAR | Status: DC | PRN
  Administered 2016-12-24 – 2016-12-28 (×11): 30 mg via INTRAVENOUS
  Filled 2016-12-24 (×11): qty 1

## 2016-12-24 MED ORDER — ONDANSETRON HCL 4 MG/2ML IJ SOLN
4.0000 mg | Freq: Four times a day (QID) | INTRAMUSCULAR | Status: DC | PRN
  Administered 2016-12-24 – 2016-12-25 (×3): 4 mg via INTRAVENOUS
  Filled 2016-12-24 (×4): qty 2

## 2016-12-24 MED ORDER — NALOXONE HCL 0.4 MG/ML IJ SOLN: 0.4000 mg | INTRAMUSCULAR | Status: DC | PRN

## 2016-12-24 MED ORDER — HYDROMORPHONE HCL 2 MG/ML IJ SOLN
2.0000 mg | INTRAMUSCULAR | Status: AC
  Administered 2016-12-24: 2 mg via INTRAVENOUS
  Filled 2016-12-24: qty 1

## 2016-12-24 MED ORDER — HYDROMORPHONE HCL 2 MG/ML IJ SOLN: 2.0000 mg | INTRAMUSCULAR | Status: AC

## 2016-12-24 MED ORDER — HYDROMORPHONE HCL 1 MG/ML IJ SOLN
0.5000 mg | Freq: Once | INTRAMUSCULAR | Status: AC
  Administered 2016-12-24: 0.5 mg via SUBCUTANEOUS
  Filled 2016-12-24: qty 1

## 2016-12-24 MED ORDER — SODIUM CHLORIDE 0.9 % IV SOLN
25.0000 mg | INTRAVENOUS | Status: DC | PRN
  Filled 2016-12-24: qty 0.5

## 2016-12-24 MED ORDER — HYDROMORPHONE 1 MG/ML IV SOLN
INTRAVENOUS | Status: DC
  Administered 2016-12-24: 5.6 mg via INTRAVENOUS
  Administered 2016-12-24: 6.4 mg via INTRAVENOUS
  Administered 2016-12-24: 16.2 mg via INTRAVENOUS
  Administered 2016-12-24: 0.7 mg via INTRAVENOUS
  Administered 2016-12-25: 01:00:00 via INTRAVENOUS
  Administered 2016-12-25: 0.7 mg via INTRAVENOUS
  Administered 2016-12-25: 6.3 mg via INTRAVENOUS
  Administered 2016-12-25 (×2): 1 mg via INTRAVENOUS
  Administered 2016-12-25: 8.7 mg via INTRAVENOUS
  Administered 2016-12-25: 3.5 mg via INTRAVENOUS
  Administered 2016-12-25: 23:00:00 via INTRAVENOUS
  Administered 2016-12-26: 12.6 mg via INTRAVENOUS
  Administered 2016-12-26: 25 mL via INTRAVENOUS
  Administered 2016-12-26 (×3): 1 mg via INTRAVENOUS
  Administered 2016-12-26: 4.2 mg via INTRAVENOUS
  Administered 2016-12-26 – 2016-12-27 (×2): 1 mg via INTRAVENOUS
  Administered 2016-12-27: 9.8 mg via INTRAVENOUS
  Administered 2016-12-27: 11:00:00 via INTRAVENOUS
  Administered 2016-12-27 (×4): 1 mg via INTRAVENOUS
  Administered 2016-12-28: 5.2 mg via INTRAVENOUS
  Administered 2016-12-28: 4.76 mg via INTRAVENOUS
  Administered 2016-12-28: 25 mg via INTRAVENOUS
  Administered 2016-12-28: 2.8 mg via INTRAVENOUS
  Administered 2016-12-28: 1 mg via INTRAVENOUS
  Filled 2016-12-24 (×8): qty 25

## 2016-12-24 MED ORDER — FOLIC ACID 1 MG PO TABS
1.0000 mg | ORAL_TABLET | Freq: Every day | ORAL | Status: DC
  Administered 2016-12-24 – 2016-12-28 (×5): 1 mg via ORAL
  Filled 2016-12-24 (×5): qty 1

## 2016-12-24 MED ORDER — SODIUM CHLORIDE 0.9% FLUSH: 9.0000 mL | INTRAVENOUS | Status: DC | PRN

## 2016-12-24 MED ORDER — HYDROMORPHONE HCL 1 MG/ML IJ SOLN
1.0000 mg | Freq: Once | INTRAMUSCULAR | Status: AC
  Administered 2016-12-24: 1 mg via INTRAVENOUS
  Filled 2016-12-24: qty 1

## 2016-12-24 MED ORDER — DIPHENHYDRAMINE HCL 25 MG PO CAPS: 25.0000 mg | ORAL_CAPSULE | ORAL | Status: DC | PRN

## 2016-12-24 NOTE — ED Triage Notes (Signed)
Pain all over body states sickle cell pain took medication at home about 2130 pm last night for pain and went to bed states pain worse now.

## 2016-12-24 NOTE — ED Notes (Signed)
ED Provider at bedside. 

## 2016-12-24 NOTE — H&P (Signed)
H&P   Patient Demographics:    Carmen [name], is a 26 y.o. female  MRN: [id number]   DOB - 10-03-1991  Admit Date - 12/24/2016  Outpatient Primary MD for the patient is [name], MD  Chief Complaint  Patient presents with  . Sickle Cell Pain Crisis     HPI:    Carmen [name]  is a 26 y.o. female with medical history significant of Hb SS, chronic opioid use and frequent ED utilization; who presents with complaints of progressively worsening pain all over her body that started in the last 2 days. Patient tried using her home pain medications of Percocet without significant relief of symptoms. Pain was rated as a 10 out of 10 on the pain scale. Denies any nausea, vomiting, diarrhea, or dysuria. She denies chest pain and SOB.  ED Course: Patient was seen to be afebrile with all other vitals relatively within normal limits. Lab work revealed WBC 9.3, hemoglobin 8.9, platelets 628, CMP largely normal, and all other labs were relatively within normal limits. While in the ED the patient was given Toradol, Benadryl, IV fluids, Dilaudid IV, but still complaining of pain 10 out of 10. Patient is being admitted for sickle cell pain crisis.   Review of systems:    In addition to the HPI above, patient reports No Fever/chills, No Headache, No changes with Vision or hearing, No problems swallowing food or Liquids, No Chest pain, Cough or Shortness of Breath, No Abdominal pain, No Nausea or Vomiting, Bowel movements are regular, No Blood in stool or Urine No dysuria No new skin rashes or bruises No new weakness, tingling, numbness in any extremity No recent weight gain or loss No polyuria, polydypsia or polyphagia No significant Mental Stressors  A full 10 point Review of Systems was done, except as stated above, all other Review of Systems were negative.  With Past History of the following :   Past Medical History:  Diagnosis Date  . Abortion    05/2012  . Headache(784.0)   . Sickle  cell crisis [facility])     Past Surgical History:  Procedure Laterality Date  . CHOLECYSTECTOMY N/A 11/30/2014   Procedure: LAPAROSCOPIC CHOLECYSTECTOMY SINGLE SITE WITH INTRAOPERATIVE CHOLANGIOGRAM;  Surgeon: Michael Boston, MD;  Location: [facility] OR[facility];  Service: General;  Laterality: N/A;  . SPLENECTOMY      Social History:     Social History  Substance Use Topics  . Smoking status: Never Smoker  . Smokeless tobacco: Never Used  . Alcohol use No    Lives - At home   Family History :   Family History  Problem Relation Age of Onset  . Hypertension Mother   . Sickle cell anemia Sister   . Kidney disease Sister     Lupus  . Arthritis Sister   . Sickle cell anemia Sister   . Sickle cell trait Sister   . Heart disease Maternal Aunt     CABG  . Heart disease Maternal Uncle     CABG  . Lupus Sister     Home Medications:   Prior to Admission medications   Medication Sig Start Date End Date Taking? Authorizing Provider  ibuprofen (ADVIL,MOTRIN) 800 MG tablet Take 800 mg by mouth every 8 (eight) hours as needed for mild pain or moderate pain.   Yes Historical Provider, MD  morphine (MS CONTIN) 60 MG 12 hr tablet Take 60 mg by mouth every 12 (twelve) hours.   Yes Historical Provider, MD  oxyCODONE-acetaminophen (PERCOCET)  10-325 MG tablet Take 1 tablet by mouth every 4 (four) hours as needed for pain.   Yes Historical Provider, MD     Allergies:    No Known Allergies   Physical Exam:  Vitals:  Vitals:   12/24/16 0712 12/24/16 0820  BP: 108/77 95/72  Pulse: 78 82  Resp: 18 16  Temp:     Physical Exam: Constitutional: Patient appears well-developed and well-nourished. Not in obvious distress. HENT: Normocephalic, atraumatic, External right and left ear normal. Oropharynx is clear and moist.  Eyes: Conjunctivae and EOM are normal. PERRLA, no scleral icterus. Neck: Normal ROM. Neck supple. No JVD. No tracheal deviation. No thyromegaly. CVS: RRR, S1/S2 +, no murmurs, no gallops, no  carotid bruit.  Pulmonary: Effort and breath sounds normal, no stridor, rhonchi, wheezes, rales.  Abdominal: Soft. BS +, no distension, tenderness, rebound or guarding.  Musculoskeletal: Normal range of motion. No edema and no tenderness.  Lymphadenopathy: No lymphadenopathy noted, cervical, inguinal or axillary Neuro: Alert. Normal reflexes, muscle tone coordination. No cranial nerve deficit. Skin: Skin is warm and dry. No rash noted. Not diaphoretic. No erythema. No pallor. Psychiatric: Normal mood and affect. Behavior, judgment, thought content normal.   Data Review:    CBC  Recent Labs Lab 12/21/16 1053 12/24/16 0330  WBC 14.2* 9.3  HGB 9.7* 8.9*  HCT 27.1* 25.1*  PLT 626* 628*  MCV 81.4 81.2  MCH 29.1 28.8  MCHC 35.8 35.5  RDW 14.8 14.7  LYMPHSABS 4.3* 4.8*  MONOABS 1.5* 1.3*  EOSABS 0.1 0.1  BASOSABS 0.1 0.0   ------------------------------------------------------------------------------------------------------------------  Chemistries   Recent Labs Lab 12/21/16 1121 12/24/16 0330  NA 137 143  K 3.7 3.4*  CL 105 109  CO2 26 25  GLUCOSE 96 95  BUN 13 10  CREATININE 0.49 0.61  CALCIUM 9.9 9.4  AST  --  22  ALT  --  18  ALKPHOS  --  54  BILITOT  --  2.3*   ------------------------------------------------------------------------------------------------------------------ estimated creatinine clearance is 107.6 mL/min (by C-G formula based on SCr of 0.61 mg/dL). ------------------------------------------------------------------------------------------------------------------ No results for input(s): TSH, T4TOTAL, T3FREE, THYROIDAB in the last 72 hours.  Invalid input(s): FREET3  Coagulation profile No results for input(s): INR, PROTIME in the last 168 hours. ------------------------------------------------------------------------------------------------------------------- No results for input(s): DDIMER in the last 72  hours. -------------------------------------------------------------------------------------------------------------------  Cardiac Enzymes No results for input(s): CKMB, TROPONINI, MYOGLOBIN in the last 168 hours.  Invalid input(s): CK ------------------------------------------------------------------------------------------------------------------ No results found for: BNP  ---------------------------------------------------------------------------------------------------------------  Urinalysis    Component Value Date/Time   COLORURINE YELLOW 10/16/2016 2329   APPEARANCEUR CLOUDY (A) 10/16/2016 2329   LABSPEC 1.010 10/16/2016 2329   PHURINE 6.0 10/16/2016 2329   GLUCOSEU NEGATIVE 10/16/2016 2329   HGBUR LARGE (A) 10/16/2016 2329   BILIRUBINUR NEGATIVE 10/16/2016 2329   KETONESUR NEGATIVE 10/16/2016 2329   PROTEINUR 100 (A) 10/16/2016 2329   UROBILINOGEN 1.0 09/12/2015 1548   NITRITE NEGATIVE 10/16/2016 2329   LEUKOCYTESUR LARGE (A) 10/16/2016 2329    Imaging Results:    Assessment & Plan:    Active Problems:   Sickle cell anemia with crisis (HCC)  Sickle cell anemia with pain crises: Patient complains of pain all over her body 10 out of 10 on the pain scale not relieved with initial efforts in the ED. Hemoglobin at baseline.  - Admit to medSurg bed - Sickle cell crises pain management protocol - Continue hydroxyurea and folate - IV fluids half-normal saline at 125 ml/hour - PCA pump  per protocol- full dose Dilaudid - Add clinician assisted doses - IV Toradol  Thrombocytosis: Acute on chronic - Continue to monitor  DVT Prophylaxis: Subcut Lovenox   AM Labs Ordered, also please review Full Orders  Family Communication: Admission, patient's condition and plan of care including tests being ordered have been discussed with the patient who indicate understanding and agree with the plan and Code Status.  Code Status: Full Code  Consults called: None    Admission  status: Inpatient    Time spent in minutes : 50 minutes  Xaden Kaufman MD, MHA, CPE, FACP 12/24/2016 at 8:39 AM

## 2016-12-24 NOTE — ED Provider Notes (Signed)
  Physical Exam  BP 108/77 (BP Location: Left Arm)   Pulse 78   Temp 98.4 F (36.9 C) (Oral)   Resp 18   Ht 5\' 3"  (1.6 m)   Wt 170 lb (77.1 kg)   LMP 12/07/2016   SpO2 100%   BMI 30.11 kg/m   Physical Exam  ED Course  Procedures  MDM Care assumed at sign out from Dr. Lolita Lenz. Patient hx of sickle cell with leg pain. Hg 8.9, stable, Reticulocyte 5.5. Given 3 doses of dilaudid, still 10/10 pain. I called Dr. Lennie Hummer to admit patient.     Drenda Freeze, MD 12/24/16 [phone]

## 2016-12-24 NOTE — ED Provider Notes (Signed)
[facility] DEPT Provider Note   CSN: [id number] Arrival date & time: 12/24/16  [id number]  By signing my name below, I, Carmen [name], attest that this documentation has been prepared under the direction and in the presence of Carmen Ende, MD.  Electronically Signed: Julien [name], ED Scribe. 12/24/16. 4:11 AM.  History   Chief Complaint Chief Complaint  Patient presents with  . Sickle Cell Pain Crisis   The history is provided by the patient. No language interpreter was used.  Sickle Cell Pain Crisis  Location:  Lower extremity Severity:  Moderate Onset quality:  Sudden Similar to previous crisis episodes: yes   Timing:  Sporadic Progression:  Worsening Chronicity:  Recurrent Sickle cell genotype:  SS History of pulmonary emboli: no   Context: cold exposure   Context: not alcohol consumption, not change in medication, not dehydration, not infection, not non-compliance, not low humidity, not menses, not pregnancy and not stress   Relieved by:  Prescription drugs Worsened by:  Nothing Ineffective treatments:  OTC medications Associated symptoms: no chest pain, no fever, no headaches, no nausea, no shortness of breath, no sore throat, no swelling of legs and no vomiting   Risk factors: cholecystectomy, frequent admissions for pain and frequent pain crises   Risk factors: no elevation change, no exertion, no frequent admissions for fever, no hx of pneumonia, no hx of stroke, no lack of social support, no prior acute chest, no recent air travel, no renal disease and no smoking    HPI Comments: Carmen [name] is a 26 y.o. female who has a PMhx of sickle cell anemia presents to the Emergency Department presenting for a sickle cell pain crisis that started last night. Pt states having bilateral leg pain that she describes as a sharp, irritating pain. She reports her pain presents like her typical crises. Pt expresses she have been sickle pain crises more frequently than normal. She is  on percocet and morphine for her pain release but it has not been working. Pt received her flu shot year. Her last normal period was 12/07/16. She denies fever, vomiting, and cough.  Past Medical History:  Diagnosis Date  . Abortion    05/2012  . Headache(784.0)   . Sickle cell crisis [facility])     Patient Active Problem List   Diagnosis Date Noted  . Skin abrasion   . Constipation   . Sickle cell anemia ([place]) 08/15/2016  . Breast lump 08/15/2016  . Anemia of chronic disease   . Acute URI 02/10/2016  . Sickle-cell crisis ([place]) 12/19/2015  . Sickle cell anemia with crisis ([place]) 12/19/2015  . Chest pain 11/10/2015  . Hb-SS disease with crisis ([place]) 10/17/2015  . Sickle cell disease with crisis ([place]) 09/09/2015  . Dental caries   . Pain in a tooth or teeth   . Right facial swelling   . HCAP (healthcare-associated pneumonia) 02/28/2015  . Sickle cell crisis ([place]) 02/28/2015  . Sickle cell anemia with pain ([place]) 02/16/2015  . 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 Acute calculous cholecystitis s/p lap chole 11/30/2014 11/30/2014  . Hypokalemia 11/28/2014  . Atypical chest pain 11/28/2014  . Leukocytosis 11/22/2014  . Thrombocytosis ([place]) 11/22/2014  . Anemia 05/16/2014  . Migraine, unspecified, without mention of intractable migraine without mention of status migrainosus 05/16/2014  . Sickle cell pain crisis ([place]) 11/12/2013    Past Surgical History:  Procedure Laterality Date  . CHOLECYSTECTOMY N/A 11/30/2014   Procedure: LAPAROSCOPIC CHOLECYSTECTOMY SINGLE SITE  WITH INTRAOPERATIVE CHOLANGIOGRAM;  Surgeon: Michael Boston, MD;  Location: [facility] ORS;  Service: General;  Laterality: N/A;  . SPLENECTOMY      OB History    Gravida Para Term Preterm AB Living   1       1     SAB TAB Ectopic Multiple Live Births                   Home Medications    Prior to Admission medications   Medication Sig Start Date End Date Taking? Authorizing Provider    ibuprofen (ADVIL,MOTRIN) 800 MG tablet Take 800 mg by mouth every 8 (eight) hours as needed for mild pain or moderate pain.    Historical Provider, MD  morphine (MS CONTIN) 60 MG 12 hr tablet Take 60 mg by mouth every 12 (twelve) hours.    Historical Provider, MD  oxyCODONE-acetaminophen (PERCOCET) 10-325 MG tablet Take 1 tablet by mouth every 4 (four) hours as needed for pain.    Historical Provider, MD    Family History Family History  Problem Relation Age of Onset  . Hypertension Mother   . Sickle cell anemia Sister   . Kidney disease Sister     Lupus  . Arthritis Sister   . Sickle cell anemia Sister   . Sickle cell trait Sister   . Heart disease Maternal Aunt     CABG  . Heart disease Maternal Uncle     CABG  . Lupus Sister     Social History Social History  Substance Use Topics  . Smoking status: Never Smoker  . Smokeless tobacco: Never Used  . Alcohol use No     Allergies   Patient has no known allergies.   Review of Systems Review of Systems  Constitutional: Negative for appetite change, chills and fever.  HENT: Negative for drooling, facial swelling and sore throat.   Eyes: Negative for photophobia.  Respiratory: Negative for shortness of breath.   Cardiovascular: Negative for chest pain, palpitations and leg swelling.  Gastrointestinal: Negative for anal bleeding, nausea and vomiting.  Genitourinary: Negative for difficulty urinating.  Musculoskeletal: Positive for myalgias. Negative for neck pain and neck stiffness.  Neurological: Negative for facial asymmetry, speech difficulty and headaches.  Psychiatric/Behavioral: Negative for suicidal ideas.  All other systems reviewed and are negative.    Physical Exam Updated Vital Signs BP 122/81 (BP Location: Left Arm)   Pulse 94   Temp 98.4 F (36.9 C) (Oral)   Resp 18   Ht 5\' 3"  (1.6 m)   Wt 170 lb (77.1 kg)   LMP 12/07/2016   SpO2 97%   BMI 30.11 kg/m   Physical Exam  Constitutional: She is  oriented to person, place, and time. She appears well-developed and well-nourished.  HENT:  Head: Normocephalic and atraumatic.  Mouth/Throat: Oropharynx is clear and moist. No oropharyngeal exudate.  Moist mucous membranes. No exudates.   Eyes: Conjunctivae and EOM are normal. Pupils are equal, round, and reactive to light.  Neck: Normal range of motion. Neck supple. No JVD present. No tracheal deviation present.  No carotid bruits. Trachea midline.   Cardiovascular: Normal rate, regular rhythm, normal heart sounds and intact distal pulses.  Exam reveals no gallop and no friction rub.   No murmur heard. RRR.   Pulmonary/Chest: Effort normal and breath sounds normal. No stridor. No respiratory distress. She has no wheezes. She has no rales.  Lungs CTA bilaterally.   Abdominal: Soft. Bowel sounds are normal. She exhibits  no distension and no mass. There is no tenderness. There is no rebound and no guarding.  Musculoskeletal: Normal range of motion.  Lymphadenopathy:    She has no cervical adenopathy.  Neurological: She is alert and oriented to person, place, and time. She has normal reflexes. She displays normal reflexes.  Skin: Skin is warm and dry.  Psychiatric: She has a normal mood and affect.  Nursing note and vitals reviewed.    ED Treatments / Results   Vitals:   12/24/16 0325  BP: 122/81  Pulse: 94  Resp: 18  Temp: 98.4 F (36.9 C)    DIAGNOSTIC STUDIES: Oxygen Saturation is 97% on RA, normal by my interpretation.  COORDINATION OF CARE:  4:09 AM Discussed treatment plan with pt at bedside and pt agreed to plan.  Labs (all labs ordered are listed, but only abnormal results are displayed) Results for orders placed or performed during the hospital encounter of 12/24/16  Comprehensive metabolic panel  Result Value Ref Range   Sodium 143 135 - 145 mmol/L   Potassium 3.4 (L) 3.5 - 5.1 mmol/L   Chloride 109 101 - 111 mmol/L   CO2 25 22 - 32 mmol/L   Glucose, Bld 95  65 - 99 mg/dL   BUN 10 6 - 20 mg/dL   Creatinine, Ser 0.61 0.44 - 1.00 mg/dL   Calcium 9.4 8.9 - 10.3 mg/dL   Total Protein 8.1 6.5 - 8.1 g/dL   Albumin 4.4 3.5 - 5.0 g/dL   AST 22 15 - 41 U/L   ALT 18 14 - 54 U/L   Alkaline Phosphatase 54 38 - 126 U/L   Total Bilirubin 2.3 (H) 0.3 - 1.2 mg/dL   GFR calc non Af Amer >60 >60 mL/min   GFR calc Af Amer >60 >60 mL/min   Anion gap 9 5 - 15  CBC with Differential  Result Value Ref Range   WBC 9.3 4.0 - 10.5 K/uL   RBC 3.09 (L) 3.87 - 5.11 MIL/uL   Hemoglobin 8.9 (L) 12.0 - 15.0 g/dL   HCT 25.1 (L) 36.0 - 46.0 %   MCV 81.2 78.0 - 100.0 fL   MCH 28.8 26.0 - 34.0 pg   MCHC 35.5 30.0 - 36.0 g/dL   RDW 14.7 11.5 - 15.5 %   Platelets 628 (H) 150 - 400 K/uL   Neutrophils Relative % 34 %   Neutro Abs 3.2 1.7 - 7.7 K/uL   Lymphocytes Relative 52 %   Lymphs Abs 4.8 (H) 0.7 - 4.0 K/uL   Monocytes Relative 13 %   Monocytes Absolute 1.3 (H) 0.1 - 1.0 K/uL   Eosinophils Relative 1 %   Eosinophils Absolute 0.1 0.0 - 0.7 K/uL   Basophils Relative 0 %   Basophils Absolute 0.0 0.0 - 0.1 K/uL  Reticulocytes  Result Value Ref Range   Retic Ct Pct 5.5 (H) 0.4 - 3.1 %   RBC. 3.09 (L) 3.87 - 5.11 MIL/uL   Retic Count, Manual 170.0 19.0 - 186.0 K/uL  Pregnancy, urine  Result Value Ref Range   Preg Test, Ur NEGATIVE NEGATIVE   Dg Chest 2 View  Result Date: 12/21/2016 CLINICAL DATA:  Sickle cell pain crisis.  Mid and upper back pain. EXAM: CHEST  2 VIEW COMPARISON:  12/10/2016. FINDINGS: The heart size and mediastinal contours are within normal limits. Both lungs are clear. The visualized skeletal structures are unremarkable. IMPRESSION: No active cardiopulmonary disease. Electronically Signed   By: Scherrie Gerlach.D.  On: 12/21/2016 11:29   Dg Chest 2 View  Result Date: 12/10/2016 CLINICAL DATA:  Chest pain, cough and sore throat for 5 days in a patient with sickle cell disease. EXAM: CHEST  2 VIEW COMPARISON:  PA and lateral chest 11/28/2016 and  11/23/2016 FINDINGS: Lungs are clear. Heart size is normal. No pneumothorax or pleural effusion. No acute bony abnormality. Status post cholecystectomy. IMPRESSION: No acute disease. Electronically Signed   By: Inge Rise M.D.   On: 12/10/2016 07:59   Dg Chest 2 View  Result Date: 11/28/2016 CLINICAL DATA:  26 year old female with cough. EXAM: CHEST  2 VIEW COMPARISON:  Chest radiograph dated 11/23/2016 and 08/31/2016 FINDINGS: Two views of the chest demonstrate stable area of density with blunting of the right posterior costophrenic angle on the lateral projection similar to the study dating back to 08/31/2016. No new consolidative changes noted. There is no pleural effusion or pneumothorax. The cardiac silhouette is within normal limits with no acute osseous pathology. Right upper quadrant cholecystectomy clips. IMPRESSION: No active cardiopulmonary disease. Electronically Signed   By: Anner Crete M.D.   On: 11/28/2016 03:02    Radiology No results found.  Procedures Procedures (including critical care time)  Medications Ordered in ED  Medications  HYDROmorphone (DILAUDID) injection 1 mg (not administered)  HYDROmorphone (DILAUDID) injection 2 mg (not administered)    Or  HYDROmorphone (DILAUDID) injection 2 mg (not administered)  HYDROmorphone (DILAUDID) injection 2 mg (not administered)    Or  HYDROmorphone (DILAUDID) injection 2 mg (not administered)  HYDROmorphone (DILAUDID) injection 2 mg (not administered)    Or  HYDROmorphone (DILAUDID) injection 2 mg (not administered)  HYDROmorphone (DILAUDID) injection 2 mg (not administered)    Or  HYDROmorphone (DILAUDID) injection 2 mg (not administered)  HYDROmorphone (DILAUDID) injection 0.5 mg (0.5 mg Subcutaneous Given 12/24/16 0438)     Initial Impression / Assessment and Plan / ED Course  I have reviewed the triage vital signs and the nursing notes.  Pertinent labs & imaging results that were available during my care  of the patient were reviewed by me and considered in my medical decision making (see chart for details).    Final Clinical Impressions(s) / ED Diagnoses   Final diagnoses:  None   This is a 27 y.o. -year-old female presents with typical sickle cell crisis  The patient is nontoxic-appearing on exam and vital signs are within normal limits.     Signed out to morning attending pending relief or admission  After history, exam, and medical workup I feel the patient has been appropriately medically screened and is safe for discharge home. Pertinent diagnoses were discussed with the patient. Patient was given return precautions.   I personally performed the services described in this documentation, which was scribed in my presence. The recorded information has been reviewed and is accurate.    New Prescriptions New Prescriptions   No medications on file     Challis Crill, MD 12/24/16 [phone]

## 2016-12-25 DIAGNOSIS — D5700 Hb-SS disease with crisis, unspecified: Principal | ICD-10-CM

## 2016-12-25 NOTE — Progress Notes (Signed)
Carmen [name] called from lab stating that 2 separate phlebotomist have been to unit to draw labs and patient was very rude refusing to allow them to draw labs. Labs for today 12/25/2016 have been cancelled. Lab will attempt again tomorrow.

## 2016-12-25 NOTE — Progress Notes (Signed)
Subjective: A 26 year old female with history of sickle cell disease admitted with sickle cell painful crisis. Patient is currently on Dilaudid PCA. She has used 25 mg was 36 demands and 32 delivery since admission yesterday. Pain is localized to her back arms and legs. She appeared to have had a viral prodrome which triggered her current attacks. She denied any fever or chills no cough no shortness of breath at the moment.  Objective: Vital signs in last 24 hours: Temp:  [97.9 F (36.6 C)-98.4 F (36.9 C)] 98.4 F (36.9 C) (02/24 1400) Pulse Rate:  [61-82] 78 (02/24 1400) Resp:  [11-23] 13 (02/24 1425) BP: (101-137)/(61-82) 114/64 (02/24 1400) SpO2:  [92 %-99 %] 94 % (02/24 1425) Weight:  [77 kg (169 lb 12.1 oz)] 77 kg (169 lb 12.1 oz) (02/24 0533) Weight change: 0 kg (0 lb) Last BM Date: 12/24/16  Intake/Output from previous day: 02/23 0701 - 02/24 0700 In: 3261.9 [P.O.:900; I.V.:2361.9] Out: -  Intake/Output this shift: No intake/output data recorded.  General appearance: alert, cooperative, appears stated age and no distress Neck: no adenopathy, no carotid bruit, no JVD, supple, symmetrical, trachea midline and thyroid not enlarged, symmetric, no tenderness/mass/nodules Back: symmetric, no curvature. ROM normal. No CVA tenderness. Resp: clear to auscultation bilaterally Chest wall: no tenderness Cardio: regular rate and rhythm, S1, S2 normal, no murmur, click, rub or gallop GI: soft, non-tender; bowel sounds normal; no masses,  no organomegaly Extremities: extremities normal, atraumatic, no cyanosis or edema Pulses: 2+ and symmetric Skin: Skin color, texture, turgor normal. No rashes or lesions Neurologic: Grossly normal  Lab Results:  Recent Labs  12/24/16 0330  WBC 9.3  HGB 8.9*  HCT 25.1*  PLT 628*   BMET  Recent Labs  12/24/16 0330  NA 143  K 3.4*  CL 109  CO2 25  GLUCOSE 95  BUN 10  CREATININE 0.61  CALCIUM 9.4    Studies/Results: No results  found.  Medications: I have reviewed the patient's current medications.  Assessment/Plan: A 26 year old female admitted with sickle cell painful crisis.  #1 sickle cell painful crisis: Patient will be maintained on Dilaudid PCA with Toradol. She has physician assistant dosing as needed. Continue IV fluids. Continue long-acting medication as well.  #2 sickle cell anemia: Hemoglobin appears to be at baseline. Continue close monitoring.  #3 hypokalemia: Replete potassium. Continue monitoring  #4 thrombocytosis: Due to vaso occlusive crisis. Continue close monitoring.  #5 viral prodrome: Counseling provided. Continue current medications.  LOS: 1 day   Vaden Becherer,LAWAL 12/25/2016, 2:49 PM

## 2016-12-25 NOTE — Progress Notes (Signed)
Per lab tech patient refused am labs.

## 2016-12-26 NOTE — Progress Notes (Signed)
Subjective: Patient is complaining of 8 out of 10 pain in her legs and back. Patient is currently on Dilaudid PCA. She has used 28 mg with 42 demands and 36 delivery since admission yesterday. She denied any fever or chills no cough no shortness of breath at the moment.  Objective: Vital signs in last 24 hours: Temp:  [97.8 F (36.6 C)-98.4 F (36.9 C)] 97.9 F (36.6 C) (02/25 0430) Pulse Rate:  [54-78] 68 (02/25 0430) Resp:  [10-16] 10 (02/25 0620) BP: (83-115)/(61-66) 83/64 (02/25 0430) SpO2:  [91 %-100 %] 91 % (02/25 0620) Weight:  [77.1 kg (169 lb 15.6 oz)] 77.1 kg (169 lb 15.6 oz) (02/25 0430) Weight change: -0.012 kg (-0.4 oz) Last BM Date: 12/24/16  Intake/Output from previous day: 02/24 0701 - 02/25 0700 In: 640 [P.O.:640] Out: -  Intake/Output this shift: No intake/output data recorded.  General appearance: alert, cooperative, appears stated age and no distress Neck: no adenopathy, no carotid bruit, no JVD, supple, symmetrical, trachea midline and thyroid not enlarged, symmetric, no tenderness/mass/nodules Back: symmetric, no curvature. ROM normal. No CVA tenderness. Resp: clear to auscultation bilaterally Chest wall: no tenderness Cardio: regular rate and rhythm, S1, S2 normal, no murmur, click, rub or gallop GI: soft, non-tender; bowel sounds normal; no masses,  no organomegaly Extremities: extremities normal, atraumatic, no cyanosis or edema Pulses: 2+ and symmetric Skin: Skin color, texture, turgor normal. No rashes or lesions Neurologic: Grossly normal  Lab Results:  Recent Labs  12/24/16 0330  WBC 9.3  HGB 8.9*  HCT 25.1*  PLT 628*   BMET  Recent Labs  12/24/16 0330  NA 143  K 3.4*  CL 109  CO2 25  GLUCOSE 95  BUN 10  CREATININE 0.61  CALCIUM 9.4    Studies/Results: No results found.  Medications: I have reviewed the patient's current medications.  Assessment/Plan: A 26 year old female admitted with sickle cell painful crisis.  #1  sickle cell painful crisis: Patient's pain seems to be improving slowly. Patient will be maintained on current dose of Dilaudid PCA with Toradol. She has physician assistant dosing as needed. Continue IV fluids. Continue long-acting medication as well.  #2 sickle cell anemia: Hemoglobin appears to be at baseline. Continue close monitoring. Recheck in the morning  #3 hypokalemia: Repleted potassium. Continue monitoring  #4 thrombocytosis: Due to vaso occlusive crisis. Continue close monitoring.  #5 viral prodrome: Counseling provided. Continue current medications.   LOS: 2 days   GARBA,LAWAL 12/26/2016, 9:08 AM

## 2016-12-27 LAB — COMPREHENSIVE METABOLIC PANEL
ALBUMIN: 4.2 g/dL (ref 3.5–5.0)
ALT: 105 U/L — ABNORMAL HIGH (ref 14–54)
ANION GAP: 6 (ref 5–15)
AST: 112 U/L — ABNORMAL HIGH (ref 15–41)
Alkaline Phosphatase: 60 U/L (ref 38–126)
BUN: <5 mg/dL — ABNORMAL LOW (ref 6–20)
CALCIUM: 9 mg/dL (ref 8.9–10.3)
CHLORIDE: 100 mmol/L — AB (ref 101–111)
CO2: 30 mmol/L (ref 22–32)
Creatinine, Ser: <0.3 mg/dL — ABNORMAL LOW (ref 0.44–1.00)
Glucose, Bld: 101 mg/dL — ABNORMAL HIGH (ref 65–99)
POTASSIUM: 3.4 mmol/L — AB (ref 3.5–5.1)
SODIUM: 136 mmol/L (ref 135–145)
Total Bilirubin: 5.6 mg/dL — ABNORMAL HIGH (ref 0.3–1.2)
Total Protein: 7.6 g/dL (ref 6.5–8.1)

## 2016-12-27 LAB — CBC WITH DIFFERENTIAL/PLATELET
BASOS PCT: 0 %
Basophils Absolute: 0 10*3/uL (ref 0.0–0.1)
EOS ABS: 0.1 10*3/uL (ref 0.0–0.7)
EOS PCT: 1 %
HCT: 22.2 % — ABNORMAL LOW (ref 36.0–46.0)
Hemoglobin: 8 g/dL — ABNORMAL LOW (ref 12.0–15.0)
LYMPHS ABS: 4.4 10*3/uL — AB (ref 0.7–4.0)
Lymphocytes Relative: 41 %
MCH: 29.1 pg (ref 26.0–34.0)
MCHC: 36 g/dL (ref 30.0–36.0)
MCV: 80.7 fL (ref 78.0–100.0)
MONOS PCT: 13 %
Monocytes Absolute: 1.4 10*3/uL — ABNORMAL HIGH (ref 0.1–1.0)
NEUTROS PCT: 45 %
Neutro Abs: 4.8 10*3/uL (ref 1.7–7.7)
PLATELETS: 611 10*3/uL — AB (ref 150–400)
RBC: 2.75 MIL/uL — ABNORMAL LOW (ref 3.87–5.11)
RDW: 19.4 % — ABNORMAL HIGH (ref 11.5–15.5)
WBC: 10.7 10*3/uL — ABNORMAL HIGH (ref 4.0–10.5)

## 2016-12-27 MED ORDER — NICOTINE 21 MG/24HR TD PT24
21.0000 mg | MEDICATED_PATCH | Freq: Every day | TRANSDERMAL | Status: DC
  Administered 2016-12-27 – 2016-12-28 (×2): 21 mg via TRANSDERMAL
  Filled 2016-12-27 (×2): qty 1

## 2016-12-27 MED ORDER — LORAZEPAM 1 MG PO TABS: 1.0000 mg | ORAL_TABLET | Freq: Once | ORAL | Status: DC

## 2016-12-27 MED ORDER — ZOLPIDEM TARTRATE 5 MG PO TABS
5.0000 mg | ORAL_TABLET | Freq: Once | ORAL | Status: AC
  Administered 2016-12-27: 5 mg via ORAL
  Filled 2016-12-27: qty 1

## 2016-12-27 NOTE — Progress Notes (Signed)
K level of 3.4 called in to Dr. Jonelle Sidle.

## 2016-12-27 NOTE — Progress Notes (Signed)
Subjective: Patient is still complaining of 8 out of 10 pain in her legs and back. Patient is currently on Dilaudid PCA. She has used 37.8 mg with 50 demands and 49 delivery since yesterday. She is in tears I was unable to get blood work early in the morning. She denied any fever or chills no cough no shortness of breath at the moment.  Objective: Vital signs in last 24 hours: Temp:  [98 F (36.7 C)-98.6 F (37 C)] 98.3 F (36.8 C) (02/26 0438) Pulse Rate:  [63-89] 79 (02/26 0438) Resp:  [9-20] 12 (02/26 1047) BP: (116-129)/(67-76) 129/67 (02/26 0438) SpO2:  [90 %-99 %] 94 % (02/26 1047) Weight:  [77.1 kg (170 lb)] 77.1 kg (170 lb) (02/26 0233) Weight change: 0.012 kg (0.4 oz) Last BM Date: 12/26/16  Intake/Output from previous day: 02/25 0701 - 02/26 0700 In: 840 [P.O.:840] Out: -  Intake/Output this shift: No intake/output data recorded.  General appearance: alert, cooperative, appears stated age and no distress Neck: no adenopathy, no carotid bruit, no JVD, supple, symmetrical, trachea midline and thyroid not enlarged, symmetric, no tenderness/mass/nodules Back: symmetric, no curvature. ROM normal. No CVA tenderness. Resp: clear to auscultation bilaterally Chest wall: no tenderness Cardio: regular rate and rhythm, S1, S2 normal, no murmur, click, rub or gallop GI: soft, non-tender; bowel sounds normal; no masses,  no organomegaly Extremities: extremities normal, atraumatic, no cyanosis or edema Pulses: 2+ and symmetric Skin: Skin color, texture, turgor normal. No rashes or lesions Neurologic: Grossly normal  Lab Results: No results for input(s): WBC, HGB, HCT, PLT in the last 72 hours. BMET No results for input(s): NA, K, CL, CO2, GLUCOSE, BUN, CREATININE, CALCIUM in the last 72 hours.  Studies/Results: No results found.  Medications: I have reviewed the patient's current medications.  Assessment/Plan: A 26 year old female admitted with sickle cell painful  crisis.  #1 sickle cell painful crisis: Patient will be maintained on current dose of Dilaudid PCA with Toradol. She has physician assistant dosing as needed. Continue IV fluids. Continue long-acting medication as well. Initiate home medications and decrease physician assisted dosing. She might be ready to be discharged tomorrow  #2 sickle cell anemia: Recheck hemoglobin today.  #3 hypokalemia: Repleted potassium. Continue monitoring  #4 thrombocytosis: Due to vaso occlusive crisis. Continue close monitoring.  #5 viral prodrome: Counseling provided. Continue current medications.   LOS: 3 days   Willadeen Colantuono,LAWAL 12/27/2016, 11:18 AM

## 2016-12-28 LAB — RETICULOCYTES
RBC.: 2.95 MIL/uL — AB (ref 3.87–5.11)
Retic Count, Absolute: 492.7 10*3/uL — ABNORMAL HIGH (ref 19.0–186.0)
Retic Ct Pct: 16.7 % — ABNORMAL HIGH (ref 0.4–3.1)

## 2016-12-28 MED ORDER — OXYCODONE-ACETAMINOPHEN 5-325 MG PO TABS: 1.0000 | ORAL_TABLET | Freq: Once | ORAL | Status: DC

## 2016-12-28 MED ORDER — OXYCODONE HCL 5 MG PO TABS: 5.0000 mg | ORAL_TABLET | Freq: Once | ORAL | Status: DC

## 2016-12-28 NOTE — Discharge Summary (Signed)
Carmen [name] MRN: [id number] DOB/AGE: 1991-02-11 26 y.o.  Admit date: 12/24/2016 Discharge date: 12/28/2016  Primary Care Physician:  [name], MD   Discharge Diagnoses:   Patient Active Problem List   Diagnosis Date Noted  . Sickle cell anemia ([place]) 08/15/2016  . Breast lump 08/15/2016  . Anemia of chronic disease   . Sickle cell anemia with pain ([place]) 02/16/2015  . 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 S/P laparoscopic cholecystectomy 11/30/2014  . Hypokalemia 11/28/2014  . Leukocytosis 11/22/2014  . Thrombocytosis ([place]) 11/22/2014    DISCHARGE MEDICATION: Allergies as of 12/28/2016   No Known Allergies     Medication List    TAKE these medications   ibuprofen 800 MG tablet Commonly known as:  ADVIL,MOTRIN Take 800 mg by mouth every 8 (eight) hours as needed for mild pain or moderate pain.   morphine 60 MG 12 hr tablet Commonly known as:  MS CONTIN Take 60 mg by mouth every 12 (twelve) hours.   oxyCODONE-acetaminophen 10-325 MG tablet Commonly known as:  PERCOCET Take 1 tablet by mouth every 4 (four) hours as needed for pain.         Consults:    SIGNIFICANT DIAGNOSTIC STUDIES:  Dg Chest 2 View  Result Date: 12/21/2016 CLINICAL DATA:  Sickle cell pain crisis.  Mid and upper back pain. EXAM: CHEST  2 VIEW COMPARISON:  12/10/2016. FINDINGS: The heart size and mediastinal contours are within normal limits. Both lungs are clear. The visualized skeletal structures are unremarkable. IMPRESSION: No active cardiopulmonary disease. Electronically Signed   By: Markus Daft M.D.   On: 12/21/2016 11:29   Dg Chest 2 View  Result Date: 12/10/2016 CLINICAL DATA:  Chest pain, cough and sore throat for 5 days in a patient with sickle cell disease. EXAM: CHEST  2 VIEW COMPARISON:  PA and lateral chest 11/28/2016 and 11/23/2016 FINDINGS: Lungs are clear. Heart size is normal. No pneumothorax or pleural effusion. No acute bony abnormality.  Status post cholecystectomy. IMPRESSION: No acute disease. Electronically Signed   By: Inge Rise M.D.   On: 12/10/2016 07:59       No results found for this or any previous visit (from the past 240 hour(s)).   BRIEF ADMITTING H & P: Carmen [name]  is a 26 y.o. female with medical history significant of Hb SS, chronic opioid use and frequent ED utilization; who presents with complaints of progressively worsening pain all over her body that started in thelast 2 days. Patient tried using her home pain medications of Percocet without significant relief of symptoms. Pain was rated as a 10 out of 10 on the pain scale.Denies any nausea, vomiting, diarrhea, ordysuria. She denies chest pain and SOB.  ED Course: Patient was seen to be afebrile with all other vitals relatively within normal limits. Lab work revealed WBC 9.3, hemoglobin 8.9, platelets 628, CMP largely normal, and all other labs were relatively within normal limits. While in the ED the patient was given Toradol, Benadryl, IV fluids, Dilaudid IV, butstill complaining of pain 10 out of 10. Patient is being admitted for sickle cell pain crisis.   Hospital Course:  Present on Admission: . (Resolved) Sickle cell anemia with crisis ([place])  Pt was admitted with simple crisis. She was treated with IV Dilaudid via PCA, Toradol and IVF. As pain improved she was weaned from IV Dilaudid. Pt refused transition to oral analgesics as she stated that she had to leave to get to  work. I advised patient that this placed her at increased risk of rebound pain as well as symptoms of withdrawal. However she refused taper and without any medical risk to compel further hospitalization she was discharged in stable condition. At the time of discharge, she was ambulatory without any increased work of breathing, SOB or need for assistance and she did not have any requirements for supplemental oxygen.    Disposition and Follow-up: Pt discharged home in good  condition and is to follow up with her PMD- Dr. Alyson Ingles as needed. Discharge Instructions    Activity as tolerated - No restrictions    Complete by:  As directed    Diet general    Complete by:  As directed       DISCHARGE EXAM:  General: Alert, awake, oriented x3, in no apparent distress.  HEENT: [place]/AT PEERL, EOMI, anicteric. Neck: Trachea midline, no masses, no thyromegal,y no JVD, no carotid bruit OROPHARYNX: Moist, No exudate/ erythema/lesions.  Heart: Regular rate and rhythm, without murmurs, rubs, gallops or S3. PMI non-displaced. Exam reveals no decreased pulses. Pulmonary/Chest: Normal effort. Breath sounds normal. No. Apnea. Clear to auscultation,no stridor,  no wheezing and no rhonchi noted. No respiratory distress and no tenderness noted. Abdomen: Soft, nontender, nondistended, normal bowel sounds, no masses no hepatosplenomegaly noted. No fluid wave and no ascites. There is no guarding or rebound. Neuro: Alert and oriented to person, place and time. Normal motor skills, Displays no atrophy or tremors and exhibits normal muscle tone.  No focal neurological deficits noted cranial nerves II through XII grossly intact. No sensory deficit noted. Strength at baseline in bilateral upper and lower extremities. Gait normal. Musculoskeletal: No warmth swelling or erythema around joints, no spinal tenderness noted. Psychiatric: Patient alert and oriented x3, good insight and cognition, good recent to remote recall. Mood, affect and judgement normal Skin: Skin is warm and dry. No bruising, no ecchymosis and no rash noted. Pt is not diaphoretic. No erythema. No pallor   Blood pressure 122/78, pulse 82, temperature 98.2 F (36.8 C), temperature source Oral, resp. rate 10, height 5\' 3"  (1.6 m), weight 77.1 kg (170 lb), last menstrual period 12/07/2016, SpO2 98 %.   Recent Labs  12/27/16 1451  NA 136  K 3.4*  CL 100*  CO2 30  GLUCOSE 101*  BUN <5*  CREATININE <0.30*  CALCIUM 9.0     Recent Labs  12/27/16 1451  AST 112*  ALT 105*  ALKPHOS 60  BILITOT 5.6*  PROT 7.6  ALBUMIN 4.2   No results for input(s): LIPASE, AMYLASE in the last 72 hours.  Recent Labs  12/27/16 1451  WBC 10.7*  NEUTROABS 4.8  HGB 8.0*  HCT 22.2*  MCV 80.7  PLT 611*     Total time spent including face to face and decision making was greater than 30 minutes  Signed: MATTHEWS,MICHELLE A. 12/28/2016, 1:33 PM

## 2017-01-16 ENCOUNTER — Emergency Department (HOSPITAL_COMMUNITY)
Admission: EM | Admit: 2017-01-16 | Discharge: 2017-01-17 | Disposition: A | Discharge status: Home / Self Care | Payer: Medicaid Other | Attending: Emergency Medicine | Admitting: Emergency Medicine

## 2017-01-16 ENCOUNTER — Encounter (HOSPITAL_COMMUNITY): Payer: Self-pay | Admitting: Emergency Medicine

## 2017-01-16 DIAGNOSIS — D5700 Hb-SS disease with crisis, unspecified: Secondary | ICD-10-CM | POA: Insufficient documentation

## 2017-01-16 DIAGNOSIS — M549 Dorsalgia, unspecified: Secondary | ICD-10-CM

## 2017-01-16 LAB — CBC WITH DIFFERENTIAL/PLATELET
BASOS ABS: 0 10*3/uL (ref 0.0–0.1)
BASOS PCT: 0 %
EOS PCT: 1 %
Eosinophils Absolute: 0.1 10*3/uL (ref 0.0–0.7)
HCT: 27.9 % — ABNORMAL LOW (ref 36.0–46.0)
Hemoglobin: 10.1 g/dL — ABNORMAL LOW (ref 12.0–15.0)
LYMPHS PCT: 58 %
Lymphs Abs: 4.8 10*3/uL — ABNORMAL HIGH (ref 0.7–4.0)
MCH: 30.1 pg (ref 26.0–34.0)
MCHC: 36.2 g/dL — ABNORMAL HIGH (ref 30.0–36.0)
MCV: 83 fL (ref 78.0–100.0)
Monocytes Absolute: 0.9 10*3/uL (ref 0.1–1.0)
Monocytes Relative: 11 %
NEUTROS ABS: 2.5 10*3/uL (ref 1.7–7.7)
Neutrophils Relative %: 30 %
Platelets: 579 10*3/uL — ABNORMAL HIGH (ref 150–400)
RBC: 3.36 MIL/uL — AB (ref 3.87–5.11)
RDW: 15.5 % (ref 11.5–15.5)
WBC: 8.3 10*3/uL (ref 4.0–10.5)

## 2017-01-16 LAB — COMPREHENSIVE METABOLIC PANEL
ALBUMIN: 3.9 g/dL (ref 3.5–5.0)
ALK PHOS: 49 U/L (ref 38–126)
ALT: 18 U/L (ref 14–54)
ANION GAP: 5 (ref 5–15)
AST: 28 U/L (ref 15–41)
BILIRUBIN TOTAL: 1.2 mg/dL (ref 0.3–1.2)
BUN: 10 mg/dL (ref 6–20)
CALCIUM: 9.1 mg/dL (ref 8.9–10.3)
CO2: 25 mmol/L (ref 22–32)
CREATININE: 0.42 mg/dL — AB (ref 0.44–1.00)
Chloride: 110 mmol/L (ref 101–111)
Glucose, Bld: 90 mg/dL (ref 65–99)
POTASSIUM: 4.3 mmol/L (ref 3.5–5.1)
Sodium: 140 mmol/L (ref 135–145)
Total Protein: 7.3 g/dL (ref 6.5–8.1)

## 2017-01-16 LAB — I-STAT BETA HCG BLOOD, ED (MC, WL, AP ONLY)

## 2017-01-16 LAB — RETICULOCYTES
RBC.: 3.36 MIL/uL — AB (ref 3.87–5.11)
RETIC COUNT ABSOLUTE: 241.9 10*3/uL — AB (ref 19.0–186.0)
Retic Ct Pct: 7.2 % — ABNORMAL HIGH (ref 0.4–3.1)

## 2017-01-16 MED ORDER — HYDROMORPHONE HCL 1 MG/ML IJ SOLN: 1.0000 mg | Freq: Once | INTRAMUSCULAR | Status: DC

## 2017-01-16 MED ORDER — HYDROMORPHONE HCL 1 MG/ML IJ SOLN
2.0000 mg | Freq: Once | INTRAMUSCULAR | Status: AC
  Administered 2017-01-16: 2 mg via INTRAVENOUS
  Filled 2017-01-16: qty 2

## 2017-01-16 MED ORDER — HYDROMORPHONE HCL 1 MG/ML IJ SOLN
1.0000 mg | INTRAMUSCULAR | Status: AC
  Administered 2017-01-16 (×3): 1 mg via INTRAVENOUS
  Filled 2017-01-16 (×3): qty 1

## 2017-01-16 MED ORDER — SODIUM CHLORIDE 0.45 % IV SOLN
INTRAVENOUS | Status: DC
  Administered 2017-01-16: 22:00:00 via INTRAVENOUS

## 2017-01-16 NOTE — ED Notes (Signed)
Pt still unable to give urine sample

## 2017-01-16 NOTE — ED Notes (Signed)
Delay in blood draw and start for medications. IV team consult requested

## 2017-01-16 NOTE — ED Provider Notes (Signed)
[facility] DEPT Provider Note   CSN: [id number] Arrival date & time: 01/16/17  1932     History   Chief Complaint Chief Complaint  Patient presents with  . Sickle Cell Pain Crisis    HPI Carmen [name] is a 26 y.o. female.  The history is provided by the patient and medical records.     26 year old female with history of sickle cell anemia, presenting to the ED for pain crisis. Patient reports she began having pain in her low back yesterday which is her typical site of pain. States pain is dull, aching with intermittent sharp, stabbing sensations. She denies any radiation of pain into her legs. No numbness or weakness of the legs. No bowel or bladder incontinence. She denies any fever, cough, chest pain, or shortness of breath. She has tried taking her home medications, oxycodone and morphine, without any significant relief. She's not had any nausea or vomiting. No urinary symptoms or pelvic complaints. She is managed by her primary care doctor, Dr. Alyson Ingles.  Past Medical History:  Diagnosis Date  . Abortion    05/2012  . Headache(784.0)   . Sickle cell crisis [facility])     Patient Active Problem List   Diagnosis Date Noted  . Sickle cell anemia ([place]) 08/15/2016  . Breast lump 08/15/2016  . Anemia of chronic disease   . Sickle cell anemia with pain ([place]) 02/16/2015  . 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 S/P laparoscopic cholecystectomy 11/30/2014  . Hypokalemia 11/28/2014  . Leukocytosis 11/22/2014  . Thrombocytosis ([place]) 11/22/2014    Past Surgical History:  Procedure Laterality Date  . CHOLECYSTECTOMY N/A 11/30/2014   Procedure: LAPAROSCOPIC CHOLECYSTECTOMY SINGLE SITE WITH INTRAOPERATIVE CHOLANGIOGRAM;  Surgeon: Michael Boston, MD;  Location: [facility] ORS;  Service: General;  Laterality: N/A;  . SPLENECTOMY      OB History    Gravida Para Term Preterm AB Living   1       1     SAB TAB Ectopic Multiple Live Births                    Home Medications    Prior to Admission medications   Medication Sig Start Date End Date Taking? Authorizing Provider  ibuprofen (ADVIL,MOTRIN) 800 MG tablet Take 800 mg by mouth every 8 (eight) hours as needed for mild pain or moderate pain.   Yes Historical Provider, MD  morphine (MS CONTIN) 60 MG 12 hr tablet Take 60 mg by mouth every 12 (twelve) hours.   Yes Historical Provider, MD  oxyCODONE-acetaminophen (PERCOCET) 10-325 MG tablet Take 1 tablet by mouth every 4 (four) hours as needed for pain.   Yes Historical Provider, MD    Family History Family History  Problem Relation Age of Onset  . Hypertension Mother   . Sickle cell anemia Sister   . Kidney disease Sister     Lupus  . Arthritis Sister   . Sickle cell anemia Sister   . Sickle cell trait Sister   . Heart disease Maternal Aunt     CABG  . Heart disease Maternal Uncle     CABG  . Lupus Sister     Social History Social History  Substance Use Topics  . Smoking status: Never Smoker  . Smokeless tobacco: Never Used  . Alcohol use No     Allergies   Patient has no known allergies.   Review of Systems Review of Systems  Musculoskeletal: Positive for  back pain.  All other systems reviewed and are negative.    Physical Exam Updated Vital Signs BP (!) 127/95   Pulse 78   Temp 98.8 F (37.1 C) (Oral)   Resp 14   Wt 79.4 kg   LMP 01/04/2017   SpO2 98%   BMI 31.00 kg/m   Physical Exam  Constitutional: She is oriented to person, place, and time. She appears well-developed and well-nourished.  HENT:  Head: Normocephalic and atraumatic.  Mouth/Throat: Oropharynx is clear and moist.  Eyes: Conjunctivae and EOM are normal. Pupils are equal, round, and reactive to light.  Neck: Normal range of motion.  Cardiovascular: Normal rate, regular rhythm and normal heart sounds.   Pulmonary/Chest: Effort normal and breath sounds normal.  Abdominal: Soft. Bowel sounds are normal.  Musculoskeletal: Normal  range of motion.  No deformities of the spine, no signs of trauma, normal strength and sensation of all 4 extremities, normal gait  Neurological: She is alert and oriented to person, place, and time.  Skin: Skin is warm and dry.  Psychiatric: She has a normal mood and affect.  Nursing note and vitals reviewed.    ED Treatments / Results  Labs (all labs ordered are listed, but only abnormal results are displayed) Labs Reviewed  CBC WITH DIFFERENTIAL/PLATELET - Abnormal; Notable for the following:       Result Value   RBC 3.36 (*)    Hemoglobin 10.1 (*)    HCT 27.9 (*)    MCHC 36.2 (*)    Platelets 579 (*)    Lymphs Abs 4.8 (*)    All other components within normal limits  RETICULOCYTES - Abnormal; Notable for the following:    Retic Ct Pct 7.2 (*)    RBC. 3.36 (*)    Retic Count, Manual 241.9 (*)    All other components within normal limits  COMPREHENSIVE METABOLIC PANEL - Abnormal; Notable for the following:    Creatinine, Ser 0.42 (*)    All other components within normal limits  URINALYSIS, ROUTINE W REFLEX MICROSCOPIC  I-STAT BETA HCG BLOOD, ED ([facility], [facility], [facility] ONLY)    EKG  EKG Interpretation None       Radiology No results found.  Procedures Procedures (including critical care time)  Medications Ordered in ED Medications  0.45 % sodium chloride infusion ( Intravenous New Bag/Given 01/16/17 2131)  HYDROmorphone (DILAUDID) injection 1 mg (1 mg Intravenous Given 01/16/17 2257)  HYDROmorphone (DILAUDID) injection 2 mg (2 mg Intravenous Given 01/16/17 2332)  HYDROmorphone (DILAUDID) injection 2 mg (2 mg Intravenous Given 01/17/17 0045)     Initial Impression / Assessment and Plan / ED Course  I have reviewed the triage vital signs and the nursing notes.  Pertinent labs & imaging results that were available during my care of the patient were reviewed by me and considered in my medical decision making (see chart for details).  26 year old female here with sickle cell  pain crisis. Pain localized to low back which is typical for her. She has no fever, cough, chest pain, or other symptoms suggestive of acute chest syndrome. Pain is without neurologic deficits concerning for cauda equina. No other red flag symptoms. We'll plan for labs, IV fluids, pain medication.  Patient labwork is overall reassuring, consistent with mild crisis. Her hemoglobin is stable.  After multiple doses of IV Dilaudid and IV fluids here pain has improved. Patient feels comfortable that she can continue managing any remaining pain at home. She does not have a ride  home currently, aware that she will need to wait here for at least 4 hours prior to driving as she did receive narcotics.  She will follow-up with her primary care doctor.  Discussed plan with patient, she acknowledged understanding and agreed with plan of care.  Return precautions given for new or worsening symptoms.  Final Clinical Impressions(s) / ED Diagnoses   Final diagnoses:  Sickle cell pain crisis ([place])  Acute back pain, unspecified back location, unspecified back pain laterality    New Prescriptions New Prescriptions   No medications on file     Larene Pickett, Hershal Coria 01/17/17 [place], MD 01/17/17 0111

## 2017-01-16 NOTE — ED Notes (Signed)
Asked pt if she could give a urine sample, but said she's not able to. 

## 2017-01-16 NOTE — ED Triage Notes (Signed)
Pt from home with complaints of sickle cell related pain in her lower back that began last night. Pt rates pain at 10/10. Pt took morphine last night. Pt has not taken any medications today.

## 2017-01-17 MED ORDER — HYDROMORPHONE HCL 1 MG/ML IJ SOLN
2.0000 mg | Freq: Once | INTRAMUSCULAR | Status: AC
  Administered 2017-01-17: 2 mg via INTRAVENOUS
  Filled 2017-01-17: qty 2

## 2017-01-17 NOTE — Discharge Instructions (Signed)
Continue your home pain medication regimen. Follow-up with your primary care doctor. Return here for new concerns.

## 2017-01-18 ENCOUNTER — Encounter (HOSPITAL_COMMUNITY): Payer: Self-pay | Admitting: Emergency Medicine

## 2017-01-18 ENCOUNTER — Emergency Department (HOSPITAL_COMMUNITY)
Admission: EM | Admit: 2017-01-18 | Discharge: 2017-01-18 | Disposition: A | Discharge status: Home / Self Care | Payer: Medicaid Other | Source: Home / Self Care | Attending: Emergency Medicine | Admitting: Emergency Medicine

## 2017-01-18 DIAGNOSIS — D5700 Hb-SS disease with crisis, unspecified: Secondary | ICD-10-CM

## 2017-01-18 LAB — RETICULOCYTES
RBC.: 3.26 MIL/uL — AB (ref 3.87–5.11)
RETIC CT PCT: 5.2 % — AB (ref 0.4–3.1)
Retic Count, Absolute: 169.5 10*3/uL (ref 19.0–186.0)

## 2017-01-18 LAB — CBC WITH DIFFERENTIAL/PLATELET
BASOS PCT: 0 %
Basophils Absolute: 0 10*3/uL (ref 0.0–0.1)
EOS ABS: 0.1 10*3/uL (ref 0.0–0.7)
EOS PCT: 1 %
HCT: 27.2 % — ABNORMAL LOW (ref 36.0–46.0)
Hemoglobin: 9.6 g/dL — ABNORMAL LOW (ref 12.0–15.0)
LYMPHS ABS: 4.1 10*3/uL — AB (ref 0.7–4.0)
Lymphocytes Relative: 56 %
MCH: 29.4 pg (ref 26.0–34.0)
MCHC: 35.3 g/dL (ref 30.0–36.0)
MCV: 83.4 fL (ref 78.0–100.0)
MONO ABS: 1 10*3/uL (ref 0.1–1.0)
MONOS PCT: 14 %
Neutro Abs: 2.1 10*3/uL (ref 1.7–7.7)
Neutrophils Relative %: 29 %
PLATELETS: 543 10*3/uL — AB (ref 150–400)
RBC: 3.26 MIL/uL — ABNORMAL LOW (ref 3.87–5.11)
RDW: 15.1 % (ref 11.5–15.5)
WBC: 7.4 10*3/uL (ref 4.0–10.5)

## 2017-01-18 LAB — COMPREHENSIVE METABOLIC PANEL
ALBUMIN: 4.1 g/dL (ref 3.5–5.0)
ALK PHOS: 51 U/L (ref 38–126)
ALT: 16 U/L (ref 14–54)
AST: 22 U/L (ref 15–41)
Anion gap: 10 (ref 5–15)
BUN: 12 mg/dL (ref 6–20)
CALCIUM: 9.6 mg/dL (ref 8.9–10.3)
CHLORIDE: 105 mmol/L (ref 101–111)
CO2: 24 mmol/L (ref 22–32)
CREATININE: 0.58 mg/dL (ref 0.44–1.00)
GFR calc non Af Amer: >60 mL/min (ref >60)
GLUCOSE: 86 mg/dL (ref 65–99)
Potassium: 3.9 mmol/L (ref 3.5–5.1)
Sodium: 139 mmol/L (ref 135–145)
Total Bilirubin: 1.9 mg/dL — ABNORMAL HIGH (ref 0.3–1.2)
Total Protein: 7.2 g/dL (ref 6.5–8.1)

## 2017-01-18 MED ORDER — HYDROMORPHONE HCL 1 MG/ML IJ SOLN
2.0000 mg | INTRAMUSCULAR | Status: AC | PRN
  Administered 2017-01-18 (×3): 2 mg via INTRAVENOUS
  Filled 2017-01-18 (×3): qty 2

## 2017-01-18 MED ORDER — SODIUM CHLORIDE 0.9 % IV BOLUS (SEPSIS)
1000.0000 mL | Freq: Once | INTRAVENOUS | Status: AC
  Administered 2017-01-18: 1000 mL via INTRAVENOUS

## 2017-01-18 MED ORDER — KETOROLAC TROMETHAMINE 30 MG/ML IJ SOLN
30.0000 mg | Freq: Once | INTRAMUSCULAR | Status: AC
  Administered 2017-01-18: 30 mg via INTRAVENOUS
  Filled 2017-01-18: qty 1

## 2017-01-18 NOTE — ED Triage Notes (Addendum)
Pt to ED from home c/o sickle cell crisis pain, stated she was seen at [facility] yesterday evening for back pain and given 2 pain prescriptions which she had taken yesterday (percocet around 1430 and morphine tablet around 1800) and took a nap. Pt states that when she woke up around midnight, her pain worsened in her back and then moved down into her legs. Pt ambulatory in triage. Denies CP, SOB, headache, N/V.

## 2017-01-18 NOTE — ED Provider Notes (Signed)
[facility] DEPT Provider Note   CSN: [id number] Arrival date & time: 01/18/17  4970     History   Chief Complaint Chief Complaint  Patient presents with  . Sickle Cell Pain Crisis    HPI Carmen [name] is a 26 y.o. female.  HPI  This is a 26 year old female with a history of sickle cell anemia who presents with back pain and leg pain. Patient was seen yesterday for lower back pain at [facility]. She was given pain medication and pain and improved. She states that she went home and she continued her Percocet and morphine by mouth. However, she woke up this evening with progressively worsening back pain and leg pain. This is consistent with prior sickle cell crises. She rates her pain at 10 out of 10. She denies any fever, chest pain, shortness of breath.  Past Medical History:  Diagnosis Date  . Abortion    05/2012  . Headache(784.0)   . Sickle cell crisis [facility])     Patient Active Problem List   Diagnosis Date Noted  . Sickle cell anemia ([place]) 08/15/2016  . Breast lump 08/15/2016  . Anemia of chronic disease   . Sickle cell anemia with pain ([place]) 02/16/2015  . 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 S/P laparoscopic cholecystectomy 11/30/2014  . Hypokalemia 11/28/2014  . Leukocytosis 11/22/2014  . Thrombocytosis ([place]) 11/22/2014    Past Surgical History:  Procedure Laterality Date  . CHOLECYSTECTOMY N/A 11/30/2014   Procedure: LAPAROSCOPIC CHOLECYSTECTOMY SINGLE SITE WITH INTRAOPERATIVE CHOLANGIOGRAM;  Surgeon: Michael Boston, MD;  Location: [facility] ORS;  Service: General;  Laterality: N/A;  . SPLENECTOMY      OB History    Gravida Para Term Preterm AB Living   1       1     SAB TAB Ectopic Multiple Live Births                   Home Medications    Prior to Admission medications   Medication Sig Start Date End Date Taking? Authorizing Provider  ibuprofen (ADVIL,MOTRIN) 800 MG tablet Take 800 mg by mouth every 8 (eight)  hours as needed for mild pain or moderate pain.   Yes Historical Provider, MD  morphine (MS CONTIN) 60 MG 12 hr tablet Take 60 mg by mouth every 12 (twelve) hours.   Yes Historical Provider, MD  oxyCODONE-acetaminophen (PERCOCET) 10-325 MG tablet Take 1 tablet by mouth every 4 (four) hours as needed for pain.   Yes Historical Provider, MD    Family History Family History  Problem Relation Age of Onset  . Hypertension Mother   . Sickle cell anemia Sister   . Kidney disease Sister     Lupus  . Arthritis Sister   . Sickle cell anemia Sister   . Sickle cell trait Sister   . Heart disease Maternal Aunt     CABG  . Heart disease Maternal Uncle     CABG  . Lupus Sister     Social History Social History  Substance Use Topics  . Smoking status: Never Smoker  . Smokeless tobacco: Never Used  . Alcohol use No     Allergies   Patient has no known allergies.   Review of Systems Review of Systems  Constitutional: Negative for fever.  Respiratory: Negative for shortness of breath.   Cardiovascular: Negative for chest pain.  Musculoskeletal: Positive for back pain.       Leg  pain  All other systems reviewed and are negative.    Physical Exam Updated Vital Signs BP 129/77   Pulse 61   Temp 97.9 F (36.6 C) (Oral)   Resp (!) 21   Ht 5\' 3"  (1.6 m)   Wt 180 lb (81.6 kg)   LMP 01/04/2017 (Exact Date)   SpO2 96%   BMI 31.89 kg/m   Physical Exam  Constitutional: She is oriented to person, place, and time. She appears well-developed and well-nourished. No distress.  HENT:  Head: Normocephalic and atraumatic.  Cardiovascular: Normal rate, regular rhythm and normal heart sounds.   Pulmonary/Chest: Effort normal and breath sounds normal. No respiratory distress.  Abdominal: Soft. There is no tenderness.  Neurological: She is alert and oriented to person, place, and time.  Skin: Skin is warm and dry.  Psychiatric: She has a normal mood and affect.  Nursing note and vitals  reviewed.    ED Treatments / Results  Labs (all labs ordered are listed, but only abnormal results are displayed) Labs Reviewed  COMPREHENSIVE METABOLIC PANEL - Abnormal; Notable for the following:       Result Value   Total Bilirubin 1.9 (*)    All other components within normal limits  CBC WITH DIFFERENTIAL/PLATELET - Abnormal; Notable for the following:    RBC 3.26 (*)    Hemoglobin 9.6 (*)    HCT 27.2 (*)    Platelets 543 (*)    Lymphs Abs 4.1 (*)    All other components within normal limits  RETICULOCYTES - Abnormal; Notable for the following:    Retic Ct Pct 5.2 (*)    RBC. 3.26 (*)    All other components within normal limits    EKG  EKG Interpretation None       Radiology No results found.  Procedures Procedures (including critical care time)  Medications Ordered in ED Medications  sodium chloride 0.9 % bolus 1,000 mL (1,000 mLs Intravenous New Bag/Given 01/18/17 0527)  HYDROmorphone (DILAUDID) injection 2 mg (2 mg Intravenous Given 01/18/17 0628)  ketorolac (TORADOL) 30 MG/ML injection 30 mg (30 mg Intravenous Given 01/18/17 0524)     Initial Impression / Assessment and Plan / ED Course  I have reviewed the triage vital signs and the nursing notes.  Pertinent labs & imaging results that were available during my care of the patient were reviewed by me and considered in my medical decision making (see chart for details).  Clinical Course as of Jan 19 648  Tue Jan 18, 2017  0609 Patient has received her second dose pain medication. Reports improving pain. Will reassess.  [CH]    Clinical Course User Index [CH] Merryl Hacker, MD    Patient presents with pain consistent with prior sickle cell crises. She was seen and evaluated for the same yesterday. She is nontoxic on exam. No symptoms of acute chest. Lab work is largely reassuring. She received doses of IV pain medication, Toradol, and fluids. After her third dose, patient reports improvement of pain  and feels that she can go home.  After history, exam, and medical workup I feel the patient has been appropriately medically screened and is safe for discharge home. Pertinent diagnoses were discussed with the patient. Patient was given return precautions.   Final Clinical Impressions(s) / ED Diagnoses   Final diagnoses:  Sickle cell pain crisis [facility])    New Prescriptions New Prescriptions   No medications on file     Merryl Hacker, MD 01/18/17  0650  

## 2017-01-19 ENCOUNTER — Encounter (HOSPITAL_COMMUNITY): Payer: Self-pay | Admitting: Emergency Medicine

## 2017-01-19 ENCOUNTER — Encounter (HOSPITAL_COMMUNITY): Payer: Self-pay | Admitting: *Deleted

## 2017-01-19 ENCOUNTER — Inpatient Hospital Stay (HOSPITAL_COMMUNITY)
Admission: EM | Admit: 2017-01-19 | Discharge: 2017-01-25 | DRG: 812 | Disposition: A | Discharge status: Home / Self Care | Payer: Medicaid Other | Attending: Internal Medicine | Admitting: Internal Medicine

## 2017-01-19 ENCOUNTER — Non-Acute Institutional Stay (HOSPITAL_BASED_OUTPATIENT_CLINIC_OR_DEPARTMENT_OTHER)
Admission: AD | Admit: 2017-01-19 | Discharge: 2017-01-19 | Disposition: A | Discharge status: Home / Self Care | Payer: Medicaid Other | Source: Ambulatory Visit | Attending: Internal Medicine | Admitting: Internal Medicine

## 2017-01-19 ENCOUNTER — Emergency Department (HOSPITAL_COMMUNITY)
Admission: EM | Admit: 2017-01-19 | Discharge: 2017-01-19 | Disposition: A | Discharge status: Home / Self Care | Payer: Medicaid Other | Source: Home / Self Care | Attending: Emergency Medicine | Admitting: Emergency Medicine

## 2017-01-19 DIAGNOSIS — Z9081 Acquired absence of spleen: Secondary | ICD-10-CM

## 2017-01-19 DIAGNOSIS — D5700 Hb-SS disease with crisis, unspecified: Principal | ICD-10-CM | POA: Diagnosis present

## 2017-01-19 DIAGNOSIS — Z9049 Acquired absence of other specified parts of digestive tract: Secondary | ICD-10-CM | POA: Diagnosis not present

## 2017-01-19 DIAGNOSIS — D473 Essential (hemorrhagic) thrombocythemia: Secondary | ICD-10-CM | POA: Diagnosis not present

## 2017-01-19 DIAGNOSIS — K5900 Constipation, unspecified: Secondary | ICD-10-CM | POA: Diagnosis present

## 2017-01-19 DIAGNOSIS — G8929 Other chronic pain: Secondary | ICD-10-CM | POA: Diagnosis present

## 2017-01-19 DIAGNOSIS — Z841 Family history of disorders of kidney and ureter: Secondary | ICD-10-CM | POA: Diagnosis not present

## 2017-01-19 DIAGNOSIS — Z8261 Family history of arthritis: Secondary | ICD-10-CM | POA: Diagnosis not present

## 2017-01-19 DIAGNOSIS — Z79899 Other long term (current) drug therapy: Secondary | ICD-10-CM | POA: Insufficient documentation

## 2017-01-19 DIAGNOSIS — D638 Anemia in other chronic diseases classified elsewhere: Secondary | ICD-10-CM | POA: Diagnosis present

## 2017-01-19 DIAGNOSIS — R7989 Other specified abnormal findings of blood chemistry: Secondary | ICD-10-CM | POA: Diagnosis present

## 2017-01-19 DIAGNOSIS — Z832 Family history of diseases of the blood and blood-forming organs and certain disorders involving the immune mechanism: Secondary | ICD-10-CM

## 2017-01-19 DIAGNOSIS — Z8249 Family history of ischemic heart disease and other diseases of the circulatory system: Secondary | ICD-10-CM | POA: Diagnosis not present

## 2017-01-19 DIAGNOSIS — Z79891 Long term (current) use of opiate analgesic: Secondary | ICD-10-CM | POA: Diagnosis not present

## 2017-01-19 DIAGNOSIS — G894 Chronic pain syndrome: Secondary | ICD-10-CM | POA: Diagnosis not present

## 2017-01-19 DIAGNOSIS — D75839 Thrombocytosis, unspecified: Secondary | ICD-10-CM | POA: Diagnosis present

## 2017-01-19 DIAGNOSIS — D571 Sickle-cell disease without crisis: Secondary | ICD-10-CM | POA: Diagnosis present

## 2017-01-19 LAB — CBC WITH DIFFERENTIAL/PLATELET
BASOS PCT: 0 %
Basophils Absolute: 0 10*3/uL (ref 0.0–0.1)
EOS ABS: 0.1 10*3/uL (ref 0.0–0.7)
Eosinophils Relative: 1 %
HCT: 28.2 % — ABNORMAL LOW (ref 36.0–46.0)
HEMOGLOBIN: 10.1 g/dL — AB (ref 12.0–15.0)
Lymphocytes Relative: 41 %
Lymphs Abs: 3.4 10*3/uL (ref 0.7–4.0)
MCH: 29.3 pg (ref 26.0–34.0)
MCHC: 35.8 g/dL (ref 30.0–36.0)
MCV: 81.7 fL (ref 78.0–100.0)
Monocytes Absolute: 1.1 10*3/uL — ABNORMAL HIGH (ref 0.1–1.0)
Monocytes Relative: 13 %
NEUTROS PCT: 45 %
Neutro Abs: 3.7 10*3/uL (ref 1.7–7.7)
PLATELETS: 601 10*3/uL — AB (ref 150–400)
RBC: 3.45 MIL/uL — ABNORMAL LOW (ref 3.87–5.11)
RDW: 14.9 % (ref 11.5–15.5)
WBC: 8.3 10*3/uL (ref 4.0–10.5)

## 2017-01-19 LAB — COMPREHENSIVE METABOLIC PANEL
ALT: 19 U/L (ref 14–54)
ANION GAP: 11 (ref 5–15)
AST: 26 U/L (ref 15–41)
Albumin: 4.8 g/dL (ref 3.5–5.0)
Alkaline Phosphatase: 55 U/L (ref 38–126)
BILIRUBIN TOTAL: 2.5 mg/dL — AB (ref 0.3–1.2)
BUN: 10 mg/dL (ref 6–20)
CHLORIDE: 107 mmol/L (ref 101–111)
CO2: 22 mmol/L (ref 22–32)
CREATININE: 0.62 mg/dL (ref 0.44–1.00)
Calcium: 10 mg/dL (ref 8.9–10.3)
Glucose, Bld: 82 mg/dL (ref 65–99)
POTASSIUM: 3.4 mmol/L — AB (ref 3.5–5.1)
Sodium: 140 mmol/L (ref 135–145)
Total Protein: 8.8 g/dL — ABNORMAL HIGH (ref 6.5–8.1)

## 2017-01-19 LAB — RETICULOCYTES
RBC.: 3.45 MIL/uL — ABNORMAL LOW (ref 3.87–5.11)
RETIC CT PCT: 5 % — AB (ref 0.4–3.1)
Retic Count, Absolute: 172.5 10*3/uL (ref 19.0–186.0)

## 2017-01-19 MED ORDER — HYDROMORPHONE HCL 1 MG/ML IJ SOLN: 2.0000 mg | INTRAMUSCULAR | Status: AC

## 2017-01-19 MED ORDER — NALOXONE HCL 0.4 MG/ML IJ SOLN: 0.4000 mg | INTRAMUSCULAR | Status: DC | PRN

## 2017-01-19 MED ORDER — DIPHENHYDRAMINE HCL 25 MG PO CAPS
25.0000 mg | ORAL_CAPSULE | ORAL | Status: DC | PRN
  Administered 2017-01-24 – 2017-01-25 (×2): 25 mg via ORAL
  Filled 2017-01-19 (×2): qty 1

## 2017-01-19 MED ORDER — ONDANSETRON HCL 4 MG PO TABS
4.0000 mg | ORAL_TABLET | ORAL | Status: DC | PRN
  Administered 2017-01-21: 4 mg via ORAL
  Filled 2017-01-19: qty 1

## 2017-01-19 MED ORDER — ALUM & MAG HYDROXIDE-SIMETH 200-200-20 MG/5ML PO SUSP: 15.0000 mL | ORAL | Status: DC | PRN

## 2017-01-19 MED ORDER — HYDROMORPHONE HCL 1 MG/ML IJ SOLN
2.0000 mg | INTRAMUSCULAR | Status: AC
  Administered 2017-01-19: 2 mg via INTRAVENOUS
  Filled 2017-01-19: qty 2

## 2017-01-19 MED ORDER — POLYETHYLENE GLYCOL 3350 17 G PO PACK
17.0000 g | PACK | Freq: Every day | ORAL | Status: DC | PRN
  Administered 2017-01-23: 17 g via ORAL
  Filled 2017-01-19: qty 1

## 2017-01-19 MED ORDER — SODIUM CHLORIDE 0.9% FLUSH: 9.0000 mL | INTRAVENOUS | Status: DC | PRN

## 2017-01-19 MED ORDER — HYDROXYUREA 500 MG PO CAPS
500.0000 mg | ORAL_CAPSULE | Freq: Two times a day (BID) | ORAL | Status: DC
  Administered 2017-01-20 – 2017-01-25 (×11): 500 mg via ORAL
  Filled 2017-01-19 (×13): qty 1

## 2017-01-19 MED ORDER — HYDROMORPHONE HCL 1 MG/ML IJ SOLN: 2.0000 mg | INTRAMUSCULAR | Status: DC

## 2017-01-19 MED ORDER — DIPHENHYDRAMINE HCL 50 MG/ML IJ SOLN
25.0000 mg | Freq: Once | INTRAMUSCULAR | Status: AC
  Administered 2017-01-19: 25 mg via INTRAVENOUS
  Filled 2017-01-19: qty 1

## 2017-01-19 MED ORDER — DEXTROSE-NACL 5-0.45 % IV SOLN
INTRAVENOUS | Status: DC
  Administered 2017-01-19: 12:00:00 via INTRAVENOUS

## 2017-01-19 MED ORDER — SENNOSIDES-DOCUSATE SODIUM 8.6-50 MG PO TABS
1.0000 | ORAL_TABLET | Freq: Two times a day (BID) | ORAL | Status: DC
  Administered 2017-01-20 – 2017-01-25 (×11): 1 via ORAL
  Filled 2017-01-19 (×11): qty 1

## 2017-01-19 MED ORDER — HYDROMORPHONE HCL 1 MG/ML IJ SOLN: 2.0000 mg | Freq: Once | INTRAMUSCULAR | Status: AC

## 2017-01-19 MED ORDER — SODIUM CHLORIDE 0.9% FLUSH
3.0000 mL | Freq: Two times a day (BID) | INTRAVENOUS | Status: DC
  Administered 2017-01-21: 3 mL via INTRAVENOUS

## 2017-01-19 MED ORDER — HYDROXYUREA 500 MG PO CAPS: 1000.0000 mg | ORAL_CAPSULE | Freq: Two times a day (BID) | ORAL | Status: DC

## 2017-01-19 MED ORDER — HYDROMORPHONE 1 MG/ML IV SOLN
INTRAVENOUS | Status: DC
  Administered 2017-01-19: 11.5 mg via INTRAVENOUS
  Administered 2017-01-19: 12:00:00 via INTRAVENOUS
  Filled 2017-01-19: qty 25

## 2017-01-19 MED ORDER — HYDROMORPHONE 1 MG/ML IV SOLN
INTRAVENOUS | Status: DC
  Administered 2017-01-19: 22:00:00 via INTRAVENOUS
  Administered 2017-01-20: 12 mg via INTRAVENOUS
  Administered 2017-01-20: 06:00:00 via INTRAVENOUS
  Administered 2017-01-20: 5 mg via INTRAVENOUS
  Administered 2017-01-20: 8 mg via INTRAVENOUS
  Administered 2017-01-20: 21:00:00 via INTRAVENOUS
  Administered 2017-01-20: 10 mg via INTRAVENOUS
  Administered 2017-01-20: 1 mg via INTRAVENOUS
  Administered 2017-01-21: 11:00:00 via INTRAVENOUS
  Administered 2017-01-21: 3 mg via INTRAVENOUS
  Administered 2017-01-21: 7.93 mg via INTRAVENOUS
  Administered 2017-01-21: 4 mg via INTRAVENOUS
  Administered 2017-01-21: 9 mg via INTRAVENOUS
  Administered 2017-01-21: 23:00:00 via INTRAVENOUS
  Administered 2017-01-22: 5 mg via INTRAVENOUS
  Administered 2017-01-22: 3.85 mg via INTRAVENOUS
  Administered 2017-01-22: 12 mg via INTRAVENOUS
  Administered 2017-01-22: 5 mg via INTRAVENOUS
  Administered 2017-01-23: 11 mg via INTRAVENOUS
  Administered 2017-01-23: 6.57 mg via INTRAVENOUS
  Administered 2017-01-23: 07:00:00 via INTRAVENOUS
  Administered 2017-01-23: 6 mg via INTRAVENOUS
  Administered 2017-01-23 (×2): 8 mg via INTRAVENOUS
  Administered 2017-01-23: 12 mg via INTRAVENOUS
  Administered 2017-01-24: 7 mg via INTRAVENOUS
  Administered 2017-01-24: 14 mg via INTRAVENOUS
  Administered 2017-01-24: 1 mg via INTRAVENOUS
  Filled 2017-01-19 (×9): qty 25

## 2017-01-19 MED ORDER — ONDANSETRON HCL 4 MG/2ML IJ SOLN: 4.0000 mg | INTRAMUSCULAR | Status: DC | PRN

## 2017-01-19 MED ORDER — ONDANSETRON HCL 4 MG/2ML IJ SOLN
4.0000 mg | INTRAMUSCULAR | Status: DC | PRN
  Administered 2017-01-20 – 2017-01-24 (×3): 4 mg via INTRAVENOUS
  Filled 2017-01-19 (×4): qty 2

## 2017-01-19 MED ORDER — KETOROLAC TROMETHAMINE 30 MG/ML IJ SOLN
30.0000 mg | INTRAMUSCULAR | Status: AC
  Administered 2017-01-19: 30 mg via INTRAVENOUS
  Filled 2017-01-19: qty 1

## 2017-01-19 MED ORDER — SODIUM CHLORIDE 0.45 % IV SOLN
INTRAVENOUS | Status: DC
  Administered 2017-01-19: 08:00:00 via INTRAVENOUS

## 2017-01-19 MED ORDER — HYDROMORPHONE HCL 1 MG/ML IJ SOLN
0.5000 mg | Freq: Once | INTRAMUSCULAR | Status: AC
  Administered 2017-01-19: 0.5 mg via SUBCUTANEOUS
  Filled 2017-01-19: qty 0.5

## 2017-01-19 MED ORDER — DIPHENHYDRAMINE HCL 25 MG PO CAPS
25.0000 mg | ORAL_CAPSULE | ORAL | Status: DC | PRN
  Administered 2017-01-19: 50 mg via ORAL
  Filled 2017-01-19: qty 2

## 2017-01-19 MED ORDER — SODIUM CHLORIDE 0.9 % IV SOLN: 250.0000 mL | INTRAVENOUS | Status: DC | PRN

## 2017-01-19 MED ORDER — HYDROMORPHONE HCL 1 MG/ML IJ SOLN
2.0000 mg | Freq: Once | INTRAMUSCULAR | Status: AC
  Administered 2017-01-19: 2 mg via INTRAVENOUS
  Filled 2017-01-19: qty 2

## 2017-01-19 MED ORDER — DEXTROSE-NACL 5-0.45 % IV SOLN
INTRAVENOUS | Status: DC
  Administered 2017-01-19 – 2017-01-21 (×4): via INTRAVENOUS

## 2017-01-19 MED ORDER — SODIUM CHLORIDE 0.9 % IV SOLN
25.0000 mg | INTRAVENOUS | Status: DC | PRN
  Filled 2017-01-19: qty 0.5

## 2017-01-19 MED ORDER — ONDANSETRON HCL 4 MG/2ML IJ SOLN: 4.0000 mg | Freq: Four times a day (QID) | INTRAMUSCULAR | Status: DC | PRN

## 2017-01-19 MED ORDER — ONDANSETRON 4 MG PO TBDP
4.0000 mg | ORAL_TABLET | Freq: Once | ORAL | Status: AC
  Administered 2017-01-19: 4 mg via ORAL
  Filled 2017-01-19: qty 1

## 2017-01-19 MED ORDER — SODIUM CHLORIDE 0.9% FLUSH: 3.0000 mL | INTRAVENOUS | Status: DC | PRN

## 2017-01-19 MED ORDER — SODIUM CHLORIDE 0.9 % IV SOLN
1000.0000 mL | Freq: Once | INTRAVENOUS | Status: AC
  Administered 2017-01-19: 1000 mL via INTRAVENOUS

## 2017-01-19 MED ORDER — KETOROLAC TROMETHAMINE 30 MG/ML IJ SOLN
30.0000 mg | Freq: Four times a day (QID) | INTRAMUSCULAR | Status: AC
  Administered 2017-01-20 – 2017-01-24 (×19): 30 mg via INTRAVENOUS
  Filled 2017-01-19 (×20): qty 1

## 2017-01-19 MED ORDER — ENOXAPARIN SODIUM 40 MG/0.4ML ~~LOC~~ SOLN
40.0000 mg | SUBCUTANEOUS | Status: DC
  Administered 2017-01-24: 40 mg via SUBCUTANEOUS
  Filled 2017-01-19 (×4): qty 0.4

## 2017-01-19 NOTE — H&P (Signed)
[facility] History and Physical   Date: 01/19/2017  Patient name: Carmen [name] Medical record number: [id number] Date of birth: December 17, 1990 Age: 26 y.o. Gender: female PCP: [name], MD  Attending physician: Tresa Garter, MD  Chief Complaint: Generalized pain  History of Present Illness: Ms. Carmen [name], a 26 year old patient with a history of sickle cell anemia presents complaining of generalized pain. Patient says that pain is consistent with sickle cell anemia. She was treated and evaluated in the emergency department this am. Patient was transitioned to the day infusion center for further pain management. Ms. Carmen [name] received 6 mg of IV dilaudid in the ER with minimal relief. She says that pain intensity is 10/10 described as constant and throbbing. She denies headache, chest pains, paresthesia, dysuria, abdominal pain, nausea, vomiting, or diarrhea.   Meds: Prescriptions Prior to Admission  Medication Sig Dispense Refill Last Dose  . morphine (MS CONTIN) 60 MG 12 hr tablet Take 60 mg by mouth every 12 (twelve) hours.   01/18/2017 at 1730  . oxyCODONE-acetaminophen (PERCOCET) 10-325 MG tablet Take 1 tablet by mouth every 4 (four) hours as needed for pain.   01/18/2017 at 1200    Allergies: Patient has no known allergies. Past Medical History:  Diagnosis Date  . Abortion    05/2012  . Headache(784.0)   . Sickle cell crisis [facility])    Past Surgical History:  Procedure Laterality Date  . CHOLECYSTECTOMY N/A 11/30/2014   Procedure: LAPAROSCOPIC CHOLECYSTECTOMY SINGLE SITE WITH INTRAOPERATIVE CHOLANGIOGRAM;  Surgeon: Michael Boston, MD;  Location: [facility];  Service: General;  Laterality: N/A;  . SPLENECTOMY     Family History  Problem Relation Age of Onset  . Hypertension Mother   . Sickle cell anemia Sister   . Kidney disease Sister     Lupus  . Arthritis Sister   . Sickle cell anemia Sister   . Sickle cell trait Sister   . Heart disease Maternal  Aunt     CABG  . Heart disease Maternal Uncle     CABG  . Lupus Sister    Social History   Social History  . Marital status: Single    Spouse name: N/A  . Number of children: N/A  . Years of education: N/A   Occupational History  . Not on file.   Social History Main Topics  . Smoking status: Never Smoker  . Smokeless tobacco: Never Used  . Alcohol use No  . Drug use: No  . Sexual activity: Yes    Birth control/ protection: None   Other Topics Concern  . Not on file   Social History Narrative  . No narrative on file    Review of Systems: Constitutional: negative Eyes: negative for icterus Ears, nose, mouth, throat, and face: negative Respiratory: negative for cough, dyspnea on exertion and wheezing Cardiovascular: negative for chest pain, dyspnea and palpitations Gastrointestinal: negative for abdominal pain, diarrhea and nausea Genitourinary:negative for dysuria Integument/breast: negative Hematologic/lymphatic: negative Musculoskeletal:positive for arthralgias, back pain and myalgias Neurological: negative for dizziness, gait problems and headaches Endocrine: negative Allergic/Immunologic: negative  Physical Exam: Last menstrual period 01/04/2017. BP 124/60 (BP Location: Left Arm)   Pulse 84   Temp 98.5 F (36.9 C) (Oral)   Resp 11   Ht 5\' 3"  (1.6 m)   Wt 175 lb (79.4 kg)   LMP 01/04/2017 (Exact Date)   SpO2 93%   BMI 31.00 kg/m   General Appearance:    Alert, cooperative,  moderate distress, appears stated age  Head:    Normocephalic, without obvious abnormality, atraumatic  Eyes:    PERRL, conjunctiva/corneas clear, EOM's intact, fundi    benign, both eyes  Back:     Symmetric, no curvature, ROM normal, no CVA tenderness  Lungs:     Clear to auscultation bilaterally, respirations unlabored  Chest Wall:    No tenderness or deformity   Heart:    Regular rate and rhythm, S1 and S2 normal, no murmur, rub   or gallop  Abdomen:     Soft, non-tender,  bowel sounds active all four quadrants,    no masses, no organomegaly  Extremities:   Extremities normal, atraumatic, no cyanosis or edema  Pulses:   2+ and symmetric all extremities  Skin:   Skin color, texture, turgor normal, no rashes or lesions  Lymph nodes:   Cervical, supraclavicular, and axillary nodes normal  Neurologic:   CNII-XII intact, normal strength, sensation and reflexes    throughout    Lab results: Results for orders placed or performed during the hospital encounter of 01/19/17 (from the past 24 hour(s))  Comprehensive metabolic panel     Status: Abnormal   Collection Time: 01/19/17  6:38 AM  Result Value Ref Range   Sodium 140 135 - 145 mmol/L   Potassium 3.4 (L) 3.5 - 5.1 mmol/L   Chloride 107 101 - 111 mmol/L   CO2 22 22 - 32 mmol/L   Glucose, Bld 82 65 - 99 mg/dL   BUN 10 6 - 20 mg/dL   Creatinine, Ser 0.62 0.44 - 1.00 mg/dL   Calcium 10.0 8.9 - 10.3 mg/dL   Total Protein 8.8 (H) 6.5 - 8.1 g/dL   Albumin 4.8 3.5 - 5.0 g/dL   AST 26 15 - 41 U/L   ALT 19 14 - 54 U/L   Alkaline Phosphatase 55 38 - 126 U/L   Total Bilirubin 2.5 (H) 0.3 - 1.2 mg/dL   GFR calc non Af Amer >60 >60 mL/min   GFR calc Af Amer >60 >60 mL/min   Anion gap 11 5 - 15  CBC with Differential     Status: Abnormal   Collection Time: 01/19/17  6:38 AM  Result Value Ref Range   WBC 8.3 4.0 - 10.5 K/uL   RBC 3.45 (L) 3.87 - 5.11 MIL/uL   Hemoglobin 10.1 (L) 12.0 - 15.0 g/dL   HCT 28.2 (L) 36.0 - 46.0 %   MCV 81.7 78.0 - 100.0 fL   MCH 29.3 26.0 - 34.0 pg   MCHC 35.8 30.0 - 36.0 g/dL   RDW 14.9 11.5 - 15.5 %   Platelets 601 (H) 150 - 400 K/uL   Neutrophils Relative % 45 %   Neutro Abs 3.7 1.7 - 7.7 K/uL   Lymphocytes Relative 41 %   Lymphs Abs 3.4 0.7 - 4.0 K/uL   Monocytes Relative 13 %   Monocytes Absolute 1.1 (H) 0.1 - 1.0 K/uL   Eosinophils Relative 1 %   Eosinophils Absolute 0.1 0.0 - 0.7 K/uL   Basophils Relative 0 %   Basophils Absolute 0.0 0.0 - 0.1 K/uL  Reticulocytes      Status: Abnormal   Collection Time: 01/19/17  6:38 AM  Result Value Ref Range   Retic Ct Pct 5.0 (H) 0.4 - 3.1 %   RBC. 3.45 (L) 3.87 - 5.11 MIL/uL   Retic Count, Manual 172.5 19.0 - 186.0 K/uL    Imaging results:  No results found.  Assessment & Plan:  Patient will be admitted to the day infusion center for extended observation  Start IV D5.45 for cellular rehydration at 125/hr  Start Dilaudid PCA High Concentration per weight based protocol.   Patient will be re-evaluated for pain intensity in the context of function and relationship to baseline as care progresses.  If no significant pain relief, will transfer patient to inpatient services for a higher level of care.   Reviewed labs from ER, hemodynamically stable   Haille Pardi M 01/19/2017, 11:14 AM

## 2017-01-19 NOTE — ED Provider Notes (Signed)
Emergency Department Provider Note   I have reviewed the triage vital signs and the nursing notes.   HISTORY  Chief Complaint Sickle Cell Pain Crisis   HPI Carmen [name] is a 26 y.o. female with PMH of SSD presents to the emergency department for evaluation of severe pain in her legs and back. The patient was seen in the emergency department earlier today with the same symptoms. She was given a total of 6 mg of Dilaudid with Toradol and IV fluids. She was certainly sickle cell day clinic where she was placed on high concentration Dilaudid PCA. Patient states she left and continued severe pain and came back to the emergency department. Patient states she left because she wanted to talk to one of the physicians before leaving but they left for home prior to discussing things with her. She denies any chest pain or difficulty breathing. She states that she was using her PCA in the day clinic. Pain worse with movement. No radiation of symptoms.    Past Medical History:  Diagnosis Date  . Abortion    05/2012  . Headache(784.0)   . Sickle cell crisis [facility])     Patient Active Problem List   Diagnosis Date Noted  . Sickle cell anemia ([place]) 08/15/2016  . Breast lump 08/15/2016  . Anemia of chronic disease   . Sickle cell anemia with pain ([place]) 02/16/2015  . 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 S/P laparoscopic cholecystectomy 11/30/2014  . Hypokalemia 11/28/2014  . Leukocytosis 11/22/2014  . Thrombocytosis ([place]) 11/22/2014    Past Surgical History:  Procedure Laterality Date  . CHOLECYSTECTOMY N/A 11/30/2014   Procedure: LAPAROSCOPIC CHOLECYSTECTOMY SINGLE SITE WITH INTRAOPERATIVE CHOLANGIOGRAM;  Surgeon: Michael Boston, MD;  Location: [facility];  Service: General;  Laterality: N/A;  . SPLENECTOMY      Current Outpatient Rx  . Order #: [id number] Class: Historical Med  . Order #: [id number] Class: Historical Med    Allergies Patient has no known  allergies.  Family History  Problem Relation Age of Onset  . Hypertension Mother   . Sickle cell anemia Sister   . Kidney disease Sister     Lupus  . Arthritis Sister   . Sickle cell anemia Sister   . Sickle cell trait Sister   . Heart disease Maternal Aunt     CABG  . Heart disease Maternal Uncle     CABG  . Lupus Sister     Social History Social History  Substance Use Topics  . Smoking status: Never Smoker  . Smokeless tobacco: Never Used  . Alcohol use No    Review of Systems  Constitutional: No fever/chills Eyes: No visual changes. ENT: No sore throat. Cardiovascular: Denies chest pain. Respiratory: Denies shortness of breath. Gastrointestinal: No abdominal pain.  No nausea, no vomiting.  No diarrhea.  No constipation. Genitourinary: Negative for dysuria. Musculoskeletal: Positive for back pain and bilateral leg pain.  Skin: Negative for rash. Neurological: Negative for headaches, focal weakness or numbness.  10-point ROS otherwise negative.  ____________________________________________   PHYSICAL EXAM:  VITAL SIGNS: ED Triage Vitals  Enc Vitals Group     BP 01/19/17 1652 109/67     Pulse Rate 01/19/17 1652 66     Resp 01/19/17 1652 15     Temp 01/19/17 1652 97.7 F (36.5 C)     Temp Source 01/19/17 1652 Oral     SpO2 01/19/17 1652 92 %     Pain Score  01/19/17 1659 10    Constitutional: Alert and oriented. Tearful and crying.  Eyes: Conjunctivae are normal. Head: Atraumatic. Nose: No congestion/rhinnorhea. Mouth/Throat: Mucous membranes are moist. Neck: No stridor.   Cardiovascular: Normal rate, regular rhythm. Good peripheral circulation. Grossly normal heart sounds.   Respiratory: Normal respiratory effort.  No retractions. Lungs CTAB. Gastrointestinal: Soft and nontender. No distention.  Musculoskeletal: No lower extremity tenderness nor edema. No gross deformities of extremities. Neurologic:  Normal speech and language. No gross focal  neurologic deficits are appreciated.  Skin:  Skin is warm, dry and intact. No rash noted. Psychiatric: Mood and affect are normal. Speech and behavior are normal.  ____________________________________________   LABS (all labs ordered are listed, but only abnormal results are displayed)  Labs from this AM reviewed  ____________________________________________  RADIOLOGY  None ____________________________________________   PROCEDURES  Procedure(s) performed:   Procedures  None ____________________________________________   INITIAL IMPRESSION / ASSESSMENT AND PLAN / ED COURSE  Pertinent labs & imaging results that were available during my care of the patient were reviewed by me and considered in my medical decision making (see chart for details).  Patient resents to the emergency department for the second time today with severe sickle cell related pain. She was treated in the emergency department as noted above and transferred to sickle cell day Center. She left there in 9/10 pain with some question in the notes as to whether she was using the PCA effectively. The patient has no significant vital sign abnormalities but does appear very uncomfortable. At this point the patient has had maximal emergency department therapy and I will ask the hospitalist to admit the patient for continued treatment and monitoring. I started D5 half normal saline along with a single dose of IV Dilaudid but discussed that any additional pain control after coming from the PCA. Patient verbalizes understanding. Will page the hospitalist.   05:49 PM Discussed patient's case with hospitalist. Patient and family (if present) updated with plan. Care transferred to hosptialist service.  I reviewed all nursing notes, vitals, pertinent old records, EKGs, labs, imaging (as available).  ____________________________________________  FINAL CLINICAL IMPRESSION(S) / ED DIAGNOSES  Final diagnoses:  Sickle cell  pain crisis ([place])     MEDICATIONS GIVEN DURING THIS VISIT:  Medications  dextrose 5 %-0.45 % sodium chloride infusion ( Intravenous New Bag/Given 01/19/17 1747)  HYDROmorphone (DILAUDID) injection 2 mg (2 mg Intravenous Given 01/19/17 1749)    Or  HYDROmorphone (DILAUDID) injection 2 mg ( Subcutaneous See Alternative 01/19/17 1749)     NEW OUTPATIENT MEDICATIONS STARTED DURING THIS VISIT:  None   Note:  This document was prepared using Dragon voice recognition software and may include unintentional dictation errors.  Nanda Quinton, MD Emergency Medicine   Margette Fast, MD 01/19/17 [phone]

## 2017-01-19 NOTE — H&P (Signed)
History and Physical  Carmen [name] HYQ:[id number] DOB: 09/12/1991 DOA: 01/19/2017  Referring physician: Nanda Quinton, ER physician PCP: [name], MD  Outpatient Specialists: [facility] cell outpatient Center Patient coming from: Home & is able to ambulate without assistance  Chief Complaint: Hurting all over   HPI: Carmen [name] is a 26 y.o. female with medical history significant of sickle cell disease, secondary anemia with occasional admissions for sickle cell pain crisis who initially presented to the emergency room 3 days ago when she started complaining of aches and pains, especially in her legs abdomen and arms and chest. Initially sent home, but then returned back again on 3/20. Sent home but then came back again early this morning 3/21 and referred over to the sickle cell day hospital. Her labs this morning noted thrombocytosis with platelet count of 601 and hemoglobin stable at 10.1. Patient was managed there with an IV PCA pump. On this helped with her pain, but in the day when he sickle cell day hospital was closing, patient stated that her pain was still severe at a 9/10 so she went back to the emergency room. She was given 1 dose of Dilaudid IV and hospitalist will call for further evaluation and admission.  Review of Systems: Patient seen in the emergency room. . Pt complains of hurting all over, especially in her legs. She also complains of pain across her entire chest as well as her entire abdomen. She is very nonspecific. She also complains of a mild headache.   Pt denies any vision changes, nausea, vomiting, dysphagia, shortness of breath, wheeze, cough, hematuria, dysuria, constipation, diarrhea, focal extremity numbness or weakness .  Review of systems are otherwise negative   Past Medical History:  Diagnosis Date  . Abortion    05/2012  . Headache(784.0)   . Sickle cell crisis [facility])    Past Surgical History:  Procedure Laterality Date  . CHOLECYSTECTOMY N/A  11/30/2014   Procedure: LAPAROSCOPIC CHOLECYSTECTOMY SINGLE SITE WITH INTRAOPERATIVE CHOLANGIOGRAM;  Surgeon: Michael Boston, MD;  Location: [facility];  Service: General;  Laterality: N/A;  . SPLENECTOMY      Social History:  reports that she has never smoked. She has never used smokeless tobacco. She reports that she does not drink alcohol or use drugs. She lives at home with her mother.    No Known Allergies  Family History  Problem Relation Age of Onset  . Hypertension Mother   . Sickle cell anemia Sister   . Kidney disease Sister     Lupus  . Arthritis Sister   . Sickle cell anemia Sister   . Sickle cell trait Sister   . Heart disease Maternal Aunt     CABG  . Heart disease Maternal Uncle     CABG  . Lupus Sister       Prior to Admission medications   Medication Sig Start Date End Date Taking? Authorizing Provider  morphine (MS CONTIN) 60 MG 12 hr tablet Take 60 mg by mouth every 12 (twelve) hours.   Yes Historical Provider, MD  oxyCODONE-acetaminophen (PERCOCET) 10-325 MG tablet Take 1 tablet by mouth every 4 (four) hours as needed for pain.   Yes Historical Provider, MD    Physical Exam: BP 121/64 (BP Location: Left Arm) Comment: Simultaneous filing. User may not have seen previous data.  Pulse 92 Comment: Simultaneous filing. User may not have seen previous data.  Temp 97.7 F (36.5 C) (Oral)   Resp 18   Ht 5\' 3"  (  1.6 m)   Wt 79.4 kg (175 lb)   LMP 01/04/2017 (Exact Date)   SpO2 94%   BMI 31.00 kg/m   General:  Slightly somnolent, otherwise mild distress secondary to generalized pain  Eyes: Sclera with some mild icterus, extraocular movements are intact  ENT: Normocephalic, atraumatic, mucous membranes are moist  Neck: Supple, no JVD  Cardiovascular: Regular rate and rhythm, B4-W9, 2/6 systolic ejection murmur  Respiratory: Clear to auscultation bilaterally  Abdomen: Soft, mild subjective tenderness throughout, nondistended, positive bowel sounds  Skin: No skin  breaks, tears or lesions  Musculoskeletal: No clubbing cyanosis, trace pitting edema  Psychiatric: Patient is appropriate, no evidence of psychoses  Neurologic: No focal deficits           Labs on Admission:  Basic Metabolic Panel:  Recent Labs Lab 01/16/17 2222 01/18/17 0116 01/19/17 0638  NA 140 139 140  K 4.3 3.9 3.4*  CL 110 105 107  CO2 25 24 22   GLUCOSE 90 86 82  BUN 10 12 10   CREATININE 0.42* 0.58 0.62  CALCIUM 9.1 9.6 10.0   Liver Function Tests:  Recent Labs Lab 01/16/17 2222 01/18/17 0116 01/19/17 0638  AST 28 22 26   ALT 18 16 19   ALKPHOS 49 51 55  BILITOT 1.2 1.9* 2.5*  PROT 7.3 7.2 8.8*  ALBUMIN 3.9 4.1 4.8   No results for input(s): LIPASE, AMYLASE in the last 168 hours. No results for input(s): AMMONIA in the last 168 hours. CBC:  Recent Labs Lab 01/16/17 2014 01/18/17 0116 01/19/17 0638  WBC 8.3 7.4 8.3  NEUTROABS 2.5 2.1 3.7  HGB 10.1* 9.6* 10.1*  HCT 27.9* 27.2* 28.2*  MCV 83.0 83.4 81.7  PLT 579* 543* 601*   Cardiac Enzymes: No results for input(s): CKTOTAL, CKMB, CKMBINDEX, TROPONINI in the last 168 hours.  BNP (last 3 results) No results for input(s): BNP in the last 8760 hours.  ProBNP (last 3 results) No results for input(s): PROBNP in the last 8760 hours.  CBG: No results for input(s): GLUCAP in the last 168 hours.  Radiological Exams on Admission: No results found.  EKG: Not done.  Assessment/Plan Present on Admission: . Sickle cell anemia (HCC) hearing: Hemoglobin stable . Sickle cell pain crisis [facility]): Noted elevated bilirubin, thrombocytosis. Patient received half normal saline while at sickle cell Center. Currently does not look volume depleted. IV Toradol, Dilaudid PCA high-dose for opioid tolerant patients.  Independent assessments for pain to ensure that she has no secondary drug-seeking issues . Thrombocytosis ([place]): Started hydroxyurea  Principal Problem:   Sickle cell pain crisis ([place]) Active Problems:    Thrombocytosis (HCC)   Sickle cell anemia (HCC)   DVT prophylaxis: Lovenox   Code Status: Full code   Family Communication: No family present, left message from mother   Disposition Plan: Likely will be in the hospital for several days until sickle cell pain crisis resolves   Consults called: Patient transferred to sickle cell inpatient service in the morning   Admission status: Given the patient will be in the hospital past to midnight requiring inpatient services, have admitted her as inpatient     Annita Brod MD [facility] Pager [phone]  If 7PM-7AM, please contact night-coverage [email or web address] Password [facility]  01/19/2017, 7:45 PM

## 2017-01-19 NOTE — ED Notes (Signed)
ED Provider at bedside. 

## 2017-01-19 NOTE — ED Triage Notes (Signed)
Pt comes from sickle cell day clinic with complaints of still hurting all over.  Pt moaning in triage and very restless in her chair. Pt still has IV placed in wrist.

## 2017-01-19 NOTE — ED Provider Notes (Signed)
[place] DEPT Provider Note   CSN: [id number] Arrival date & time: 01/19/17  6295     History   Chief Complaint Chief Complaint  Patient presents with  . Sickle Cell Pain Crisis    HPI Carmen [name] is a 26 y.o. female.  HPI Carmen [name] is a 26 y.o. female with history of sickle cell disease, presents to emergency department complaining of bilateral arm, leg, back pain. Patient states symptoms began several days ago. She states she tried to control her pain at home with her regular medications, including morphine and oxycodone with no relief. She was seen for the same yesterday and again 3 days ago. States each time feeling better in emergency department and discharged home, however once the medications wore off she would feel worse. She denies any fever or chills. No injuries. No chest pain or shortness of breath. No cough. Last took morphine 5:30PM last night  Past Medical History:  Diagnosis Date  . Abortion    05/2012  . Headache(784.0)   . Sickle cell crisis [facility])     Patient Active Problem List   Diagnosis Date Noted  . Sickle cell anemia ([place]) 08/15/2016  . Breast lump 08/15/2016  . Anemia of chronic disease   . Sickle cell anemia with pain ([place]) 02/16/2015  . 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 S/P laparoscopic cholecystectomy 11/30/2014  . Hypokalemia 11/28/2014  . Leukocytosis 11/22/2014  . Thrombocytosis ([place]) 11/22/2014    Past Surgical History:  Procedure Laterality Date  . CHOLECYSTECTOMY N/A 11/30/2014   Procedure: LAPAROSCOPIC CHOLECYSTECTOMY SINGLE SITE WITH INTRAOPERATIVE CHOLANGIOGRAM;  Surgeon: Michael Boston, MD;  Location: [facility];  Service: General;  Laterality: N/A;  . SPLENECTOMY      OB History    Gravida Para Term Preterm AB Living   1       1     SAB TAB Ectopic Multiple Live Births                   Home Medications    Prior to Admission medications   Medication Sig Start Date End Date  Taking? Authorizing Provider  ibuprofen (ADVIL,MOTRIN) 800 MG tablet Take 800 mg by mouth every 8 (eight) hours as needed for mild pain or moderate pain.    Historical Provider, MD  morphine (MS CONTIN) 60 MG 12 hr tablet Take 60 mg by mouth every 12 (twelve) hours.    Historical Provider, MD  oxyCODONE-acetaminophen (PERCOCET) 10-325 MG tablet Take 1 tablet by mouth every 4 (four) hours as needed for pain.    Historical Provider, MD    Family History Family History  Problem Relation Age of Onset  . Hypertension Mother   . Sickle cell anemia Sister   . Kidney disease Sister     Lupus  . Arthritis Sister   . Sickle cell anemia Sister   . Sickle cell trait Sister   . Heart disease Maternal Aunt     CABG  . Heart disease Maternal Uncle     CABG  . Lupus Sister     Social History Social History  Substance Use Topics  . Smoking status: Never Smoker  . Smokeless tobacco: Never Used  . Alcohol use No     Allergies   Patient has no known allergies.   Review of Systems Review of Systems  Constitutional: Negative for chills and fever.  Respiratory: Negative for cough, chest tightness and shortness of breath.  Cardiovascular: Negative for chest pain, palpitations and leg swelling.  Gastrointestinal: Negative for abdominal pain, diarrhea, nausea and vomiting.  Genitourinary: Negative for dysuria, flank pain, pelvic pain, vaginal bleeding, vaginal discharge and vaginal pain.  Musculoskeletal: Positive for arthralgias, back pain and myalgias. Negative for joint swelling, neck pain and neck stiffness.  Skin: Negative for rash.  Neurological: Negative for dizziness, weakness and headaches.  All other systems reviewed and are negative.    Physical Exam Updated Vital Signs BP (!) 135/95 (BP Location: Left Arm)   Pulse 79   Temp 98.7 F (37.1 C) (Oral)   Resp 16   Ht 5\' 3"  (1.6 m)   Wt 79.4 kg   LMP 01/04/2017 (Exact Date)   SpO2 99%   BMI 31.00 kg/m   Physical Exam    Constitutional: She is oriented to person, place, and time. She appears well-developed and well-nourished. No distress.  HENT:  Head: Normocephalic.  Eyes: Conjunctivae are normal.  Neck: Neck supple.  Cardiovascular: Normal rate, regular rhythm and normal heart sounds.   Pulmonary/Chest: Effort normal and breath sounds normal. No respiratory distress. She has no wheezes. She has no rales.  Abdominal: Soft. She exhibits no distension.  Musculoskeletal: She exhibits no edema.  Full ROM of bilateral upper and lower extremities. No joint swelling  Neurological: She is alert and oriented to person, place, and time.  Skin: Skin is warm and dry.  Psychiatric: She has a normal mood and affect. Her behavior is normal.  Nursing note and vitals reviewed.    ED Treatments / Results  Labs (all labs ordered are listed, but only abnormal results are displayed) Labs Reviewed  COMPREHENSIVE METABOLIC PANEL - Abnormal; Notable for the following:       Result Value   Potassium 3.4 (*)    Total Protein 8.8 (*)    Total Bilirubin 2.5 (*)    All other components within normal limits  CBC WITH DIFFERENTIAL/PLATELET - Abnormal; Notable for the following:    RBC 3.45 (*)    Hemoglobin 10.1 (*)    HCT 28.2 (*)    Platelets 601 (*)    Monocytes Absolute 1.1 (*)    All other components within normal limits  RETICULOCYTES - Abnormal; Notable for the following:    Retic Ct Pct 5.0 (*)    RBC. 3.45 (*)    All other components within normal limits    EKG  EKG Interpretation None       Radiology No results found.  Procedures Procedures (including critical care time)  Medications Ordered in ED Medications  ketorolac (TORADOL) 30 MG/ML injection 30 mg (not administered)  ondansetron (ZOFRAN) injection 4 mg (not administered)  0.45 % sodium chloride infusion (not administered)  ondansetron (ZOFRAN-ODT) disintegrating tablet 4 mg (4 mg Oral Given 01/19/17 0711)  HYDROmorphone (DILAUDID)  injection 0.5 mg (0.5 mg Subcutaneous Given 01/19/17 0711)  0.9 %  sodium chloride infusion (1,000 mLs Intravenous New Bag/Given 01/19/17 0711)     Initial Impression / Assessment and Plan / ED Course  I have reviewed the triage vital signs and the nursing notes.  Pertinent labs & imaging results that were available during my care of the patient were reviewed by me and considered in my medical decision making (see chart for details).     Pt with sickle cell crisis. Well appearing. VS normal. Will get labs, IV fluids, pain medications ordered.will monitor.   8:49 AM Pt received 2 doses of pain medications. Pain continues. Given heat  packs by request. Will continue to monitor.   10:37 AM Pt received 3 doses of pain medications. Pain not improved. Spoke with NP at sickle cell clinic, will send there for further treatment. Pt agreeable.   Vitals:   01/19/17 0900 01/19/17 0930 01/19/17 1000 01/19/17 1030  BP: 123/89 129/85 (!) 119/97 134/84  Pulse: 74 86 90 72  Resp: (!) 21 18 18 18   Temp:      TempSrc:      SpO2: 96% 99% 96% 95%  Weight:      Height:         Final Clinical Impressions(s) / ED Diagnoses   Final diagnoses:  Sickle cell crisis [facility])    New Prescriptions New Prescriptions   No medications on file     Jeannett Senior, PA-C 01/19/17 [place], MD 01/19/17 [phone]

## 2017-01-19 NOTE — ED Notes (Signed)
Patient given sprite.

## 2017-01-19 NOTE — Progress Notes (Signed)
Patient is refusing to have IV removed.  Patient is insisting on going to the ED for further treatment.  Patient refuses to wait for provider to come back and speak with her.  Patient is using vulgar language directing at me, and wailing arms and legs.  Patient has been crying throughout entire visit and on the phone arguing back and forth with someone.  Boyfriend at bedside.  Provider notified.

## 2017-01-19 NOTE — ED Notes (Signed)
Per RN at sickle cell clinic-patient was discharged and transferred to clinic today-states patient was some what noncompliant with her PCA/treatment at clinic-MD felt she was ok to be discharged-patient refused to be discharge and refused for her IV to be taken out-stated she was going back to ED-staff explained to her she would have to wait and would probably be discharged once seen in ED-patient was verbally abusive to staff and was arguing with her BF the entire clinic visit

## 2017-01-19 NOTE — Progress Notes (Signed)
Notified Stacy charge nurse in ED about patient and condition of IV when patient left.

## 2017-01-19 NOTE — ED Notes (Signed)
Patient transported to [facility].

## 2017-01-19 NOTE — Progress Notes (Addendum)
Patient brought to the [facility] by ED staff c/o generalized pain.Upon admission patient's pain level was 10/10 on pain scale. Patient was placed on a Dilaudid PCA given IV fluids along with IV Toradol. Patient was constantly on the phone screaming and crying talking to her "Boyfriend". Patient RN  Reminded patient numerous times to press her button as often as she needed to. Patient's PCA button on the floor every time RN went in the room. Patient remained on the phone most of her admission and didn't take advantage of PCA. Upon discahrge patient reported a pain score of 9-10/10 on pain scale. Dischage instructions given to patient and patient states an understanding. Patient alert, oriented, and ambulatory at time of discharge. Boyfriend pushed patient back to ER in wheelchair. Patient got belligerent towards staff and refused to let RN(s) removed IV.

## 2017-01-19 NOTE — ED Triage Notes (Signed)
Pt c/o sickle cell pain to her back, legs, and arms; pt states that in her recent visits she was able to get comfortable and go home and go to sleep after dc but when she wakes up she finds herself in worse pain; pt took percocet at midnight and morphine XR at 5am  with some relief

## 2017-01-19 NOTE — ED Notes (Signed)
Pt reports already having blood work done.

## 2017-01-19 NOTE — Discharge Summary (Signed)
[facility] Discharge Summary   Patient ID: Carmen [name] MRN: [id number] DOB/AGE: 1991-04-15 26 y.o.  Admit date: 01/19/2017 Discharge date: 01/19/2017  Primary Care Physician:  [name], MD  Admission Diagnoses:  Active Problems:   Sickle cell anemia with pain [facility])  Discharge Medications:  Allergies as of 01/19/2017   No Known Allergies     Medication List    TAKE these medications   morphine 60 MG 12 hr tablet Commonly known as:  MS CONTIN Take 60 mg by mouth every 12 (twelve) hours.   oxyCODONE-acetaminophen 10-325 MG tablet Commonly known as:  PERCOCET Take 1 tablet by mouth every 4 (four) hours as needed for pain.        Consults:  None  Significant Diagnostic Studies:  Dg Chest 2 View  Result Date: 12/21/2016 CLINICAL DATA:  Sickle cell pain crisis.  Mid and upper back pain. EXAM: CHEST  2 VIEW COMPARISON:  12/10/2016. FINDINGS: The heart size and mediastinal contours are within normal limits. Both lungs are clear. The visualized skeletal structures are unremarkable. IMPRESSION: No active cardiopulmonary disease. Electronically Signed   By: Markus Daft M.D.   On: 12/21/2016 11:29     [facility] Course: Ms. Carmen [name], a 26 year old female with a history of sickle cell anemia was transitioned to the day infusion center from the emergency department. The patient arrived tearful and agitated. She said that it is due to personal issues. Patient was inconsolable and did not maximize PCA use during stay.Reviewed laboratory values, consistent with previous findings. Pt was treated with IVF, Toradol and IV analgesics. Pt was initially treated with 6 mg of IV dilaudid and Toradol in the emergency department. She was started on high concentration PCA dilaudid. Patient had a total of 11.5 mg with 20 demands and 15 deliveries. Patient states that pain intensity remains 9/10. She refused to have staff remove IV. Patient says that she will  go to the emergency department.  Patient to continue home medications as prescribed by primary physicians.   Patient alert, oriented, and ambulating. She discharged accompanied by her boyfriend in stable condition.   Physical Exam at Discharge:  BP 124/60 (BP Location: Left Arm)   Pulse 84   Temp 98.5 F (36.9 C) (Oral)   Resp 11   Ht 5\' 3"  (1.6 m)   Wt 175 lb (79.4 kg)   LMP 01/04/2017 (Exact Date)   SpO2 93%   BMI 31.00 kg/m  BP 124/60 (BP Location: Left Arm)   Pulse 84   Temp 98.5 F (36.9 C) (Oral)   Resp 11   Ht 5\' 3"  (1.6 m)   Wt 175 lb (79.4 kg)   LMP 01/04/2017 (Exact Date)   SpO2 93%   BMI 31.00 kg/m   Disposition at Discharge: 01-Home or Self Care  Discharge Orders: Discharge Instructions    Discharge patient    Complete by:  As directed    Discharge disposition:  01-Home or Self Care   Discharge patient date:  01/19/2017      Condition at Discharge:   Stable  Time spent on Discharge:  15 minutes  Signed: Hollis,Lachina M 01/19/2017, 4:35 PM

## 2017-01-20 DIAGNOSIS — D5700 Hb-SS disease with crisis, unspecified: Principal | ICD-10-CM

## 2017-01-20 LAB — LACTATE DEHYDROGENASE: LDH: 247 U/L — ABNORMAL HIGH (ref 98–192)

## 2017-01-20 MED ORDER — ZOLPIDEM TARTRATE 5 MG PO TABS
5.0000 mg | ORAL_TABLET | Freq: Every evening | ORAL | Status: DC | PRN
  Administered 2017-01-20 – 2017-01-24 (×4): 5 mg via ORAL
  Filled 2017-01-20 (×4): qty 1

## 2017-01-20 MED ORDER — L-GLUTAMINE ORAL POWDER
15.0000 g | PACK | Freq: Two times a day (BID) | ORAL | Status: DC
  Administered 2017-01-20 – 2017-01-25 (×11): 15 g via ORAL
  Filled 2017-01-20 (×12): qty 3

## 2017-01-21 LAB — HIGH SENSITIVITY CRP: CRP, High Sensitivity: 5.64 mg/L — ABNORMAL HIGH (ref 0.00–3.00)

## 2017-01-21 MED ORDER — SODIUM CHLORIDE 0.9% FLUSH
10.0000 mL | INTRAVENOUS | Status: DC | PRN
  Administered 2017-01-21 – 2017-01-25 (×2): 10 mL
  Filled 2017-01-21 (×2): qty 40

## 2017-01-21 NOTE — Progress Notes (Signed)
Date:  January 21, 2017 Chart reviewed for concurrent status and case management needs. Will continue to follow patient progress. Discharge Planning: following for needs Expected discharge date: [id number] Rhonda Davis, BSN, [place], [place]

## 2017-01-21 NOTE — Progress Notes (Signed)
Peripherally Inserted Central Catheter/Midline Placement  The IV Nurse has discussed with the patient and/or persons authorized to consent for the patient, the purpose of this procedure and the potential benefits and risks involved with this procedure.  The benefits include less needle sticks, lab draws from the catheter, and the patient may be discharged home with the catheter. Risks include, but not limited to, infection, bleeding, blood clot (thrombus formation), and puncture of an artery; nerve damage and irregular heartbeat and possibility to perform a PICC exchange if needed/ordered by physician.  Alternatives to this procedure were also discussed.  Bard Power PICC patient education guide, fact sheet on infection prevention and patient information card has been provided to patient /or left at bedside.    PICC/Midline Placement Documentation        Carlyn Lemke, Nicolette Bang 01/21/2017, 3:00 PM

## 2017-01-21 NOTE — Progress Notes (Signed)
[place] PROGRESS NOTE  Carmen [name] GGY:[id number] DOB: Sep 20, 1991 DOA: 01/19/2017 PCP: [name], MD  Assessment/Plan: Principal Problem:   Sickle cell pain crisis [facility]) Active Problems:   Thrombocytosis (HCC)   Sickle cell anemia ([place])  1. Hb SS with crisis: Continue PCA, Toradol, IVF and start on Endari 15 mg BID. Re-assess pain tomorrow.  2. Anemia: Hb stable. 3. Chronic pain: Continue MS Contin   Code Status: Full Code Family Communication: N/A Disposition Plan: Not yet ready for discharge  [place].  Pager [phone]. If 7PM-7AM, please contact night-coverage.  01/21/2017, 11:18 AM  LOS: 2 days   Interim  History: Pt reports pain at level orf 8-9/10 and localized to arms, legs and back. Pt has used 36 mg with 36/33: demands/deliveries since admission.   Consultants:  None  Procedures:  None  Antibiotics:  None   Objective: Vitals:   01/19/17 1601 01/19/17 0021 01/20/17 0601 01/20/17 0901  BP: 109/67  99/70 113/76  Pulse: 66  (!) 56 (!) 54  Resp: 14 13 11 13   Temp: 97.7 F (36.5 C)  98.6 F (37 C) 98.5 F (36.9 C)  TempSrc: Axillary  Oral   SpO2: 92% 94% 93% 93%  Weight:      Height:       Weight change:   Intake/Output Summary (Last 24 hours) at 01/21/17 1118 Last data filed at 01/21/17 0531  Gross per 24 hour  Intake                0 ml  Output                2 ml  Net               -2 ml    General: Alert, awake, oriented x3, in no acute distress.  HEENT: [place]/AT PEERL, EOMI, anicteric. Neck: Trachea midline,  no masses, no thyromegal,y no JVD, no carotid bruit OROPHARYNX:  Moist, No exudate/ erythema/lesions.  Heart: Regular rate and rhythm, without murmurs, rubs, gallops, PMI non-displaced, no heaves or thrills on palpation.  Lungs: Clear to auscultation, no wheezing or rhonchi noted. No increased vocal fremitus resonant to percussion.  Abdomen: Soft, nontender, nondistended, positive bowel sounds, no masses  no hepatosplenomegaly noted.  Neuro: No focal neurological deficits noted cranial nerves II through XII grossly intact.  Strength at functional baseline in bilateral upper and lower extremities. Musculoskeletal: No warmth swelling or erythema around joints, no spinal tenderness noted. Psychiatric: Patient alert and oriented x3, good insight and cognition, good recent to remote recall.    Data Reviewed: Basic Metabolic Panel:  Recent Labs Lab 01/16/17 2222 01/18/17 0116 01/19/17 0638  NA 140 139 140  K 4.3 3.9 3.4*  CL 110 105 107  CO2 25 24 22   GLUCOSE 90 86 82  BUN 10 12 10   CREATININE 0.42* 0.58 0.62  CALCIUM 9.1 9.6 10.0   Liver Function Tests:  Recent Labs Lab 01/16/17 2222 01/18/17 0116 01/19/17 0638  AST 28 22 26   ALT 18 16 19   ALKPHOS 49 51 55  BILITOT 1.2 1.9* 2.5*  PROT 7.3 7.2 8.8*  ALBUMIN 3.9 4.1 4.8   No results for input(s): LIPASE, AMYLASE in the last 168 hours. No results for input(s): AMMONIA in the last 168 hours. CBC:  Recent Labs Lab 01/16/17 2014 01/18/17 0116 01/19/17 0638  WBC 8.3 7.4 8.3  NEUTROABS 2.5 2.1 3.7  HGB 10.1* 9.6* 10.1*  HCT 27.9* 27.2* 28.2*  MCV  83.0 83.4 81.7  PLT 579* 543* 601*   Cardiac Enzymes: No results for input(s): CKTOTAL, CKMB, CKMBINDEX, TROPONINI in the last 168 hours. BNP (last 3 results) No results for input(s): BNP in the last 8760 hours.  ProBNP (last 3 results) No results for input(s): PROBNP in the last 8760 hours.  CBG: No results for input(s): GLUCAP in the last 168 hours.  No results found for this or any previous visit (from the past 240 hour(s)).   Studies: No results found.  Scheduled Meds: . enoxaparin (LOVENOX) injection  40 mg Subcutaneous Q24H  . HYDROmorphone   Intravenous Q4H  . hydroxyurea  500 mg Oral BID  . ketorolac  30 mg Intravenous Q6H  . L-glutamine  15 g Oral BID  . senna-docusate  1 tablet Oral BID  . sodium chloride flush  3 mL Intravenous Q12H   Continuous  Infusions: . dextrose 5 % and 0.45% NaCl 125 mL/hr at 01/21/17 0645    Principal Problem:   Sickle cell pain crisis [facility]) Active Problems:   Thrombocytosis (HCC)   Sickle cell anemia (HCC)   In excess of 30 minutes spent during this visit. Greater than 50% involved face to face contact with the patient for assessment, counseling and coordination of care.

## 2017-01-21 NOTE — Progress Notes (Signed)
[place] PROGRESS NOTE  VALMA ROTENBERG WIO:[id number] DOB: 12/27/1990 DOA: 01/19/2017 PCP: [name], MD  Assessment/Plan: Principal Problem:   Sickle cell pain crisis [facility]) Active Problems:   Thrombocytosis (HCC)   Sickle cell anemia ([place])  1. Hb SS with crisis: Continue PCa at current dose, Toradol and Endari. Decrease IVF to [facility].  2. Anemia of Chronic Disease: Hb stable. 3. Chronic pain: Continue MS Contin.   Code Status: Full Code Family Communication: N/A Disposition Plan: Not yet ready for discharge  [place].  Pager [phone]. If 7PM-7AM, please contact night-coverage.  01/21/2017, 11:51 AM  LOS: 2 days   Interim History: Pt reports that she is feeling better today and rates her pain as 8/10. Pain is localized to her BLE's and low back.  She has been ambulating minimally and reports that ambulation leading to worsening pain at this time.   Consultants:  None  Procedures:  None  Antibiotics:  None   Objective: Vitals:   01/20/17 2103 01/21/17 0021 01/21/17 0615 01/21/17 0753  BP: 120/67  102/63   Pulse: (!) 59  (!) 57   Resp: 16 13 11 18   Temp: 97.6 F (36.4 C)  98.7 F (37.1 C)   TempSrc: Axillary  Oral   SpO2: 94% 94% 100% 100%  Weight:      Height:       Weight change:   Intake/Output Summary (Last 24 hours) at 01/21/17 1151 Last data filed at 01/21/17 0531  Gross per 24 hour  Intake                0 ml  Output                2 ml  Net               -2 ml    General: Alert, awake, oriented x3, in no acute distress.  HEENT: [place]/AT PEERL, EOMI, mild icterus.  Neck: Trachea midline,  no masses, no thyromegal,y no JVD, no carotid bruit OROPHARYNX:  Moist, No exudate/ erythema/lesions.  Heart: Regular rate and rhythm, without murmurs, rubs, gallops, PMI non-displaced, no heaves or thrills on palpation.  Lungs: Clear to auscultation, no wheezing or rhonchi noted. No increased vocal fremitus resonant to percussion   Abdomen: Soft, nontender, nondistended, positive bowel sounds, no masses no hepatosplenomegaly noted.  Neuro: No focal neurological deficits noted cranial nerves II through XII grossly intact.  Strength at functional baseline in bilateral upper and lower extremities. Musculoskeletal: No warmth swelling or erythema around joints, no spinal tenderness noted. Psychiatric: Patient alert and oriented x3, good insight and cognition, good recent to remote recall. Lymph node survey: No cervical axillary or inguinal lymphadenopathy noted.   Data Reviewed: Basic Metabolic Panel:  Recent Labs Lab 01/16/17 2222 01/18/17 0116 01/19/17 0638  NA 140 139 140  K 4.3 3.9 3.4*  CL 110 105 107  CO2 25 24 22   GLUCOSE 90 86 82  BUN 10 12 10   CREATININE 0.42* 0.58 0.62  CALCIUM 9.1 9.6 10.0   Liver Function Tests:  Recent Labs Lab 01/16/17 2222 01/18/17 0116 01/19/17 0638  AST 28 22 26   ALT 18 16 19   ALKPHOS 49 51 55  BILITOT 1.2 1.9* 2.5*  PROT 7.3 7.2 8.8*  ALBUMIN 3.9 4.1 4.8   No results for input(s): LIPASE, AMYLASE in the last 168 hours. No results for input(s): AMMONIA in the last 168 hours. CBC:  Recent Labs Lab 01/16/17 2014 01/18/17 0116  01/19/17 0638  WBC 8.3 7.4 8.3  NEUTROABS 2.5 2.1 3.7  HGB 10.1* 9.6* 10.1*  HCT 27.9* 27.2* 28.2*  MCV 83.0 83.4 81.7  PLT 579* 543* 601*   Cardiac Enzymes: No results for input(s): CKTOTAL, CKMB, CKMBINDEX, TROPONINI in the last 168 hours. BNP (last 3 results) No results for input(s): BNP in the last 8760 hours.  ProBNP (last 3 results) No results for input(s): PROBNP in the last 8760 hours.  CBG: No results for input(s): GLUCAP in the last 168 hours.  No results found for this or any previous visit (from the past 240 hour(s)).   Studies: No results found.  Scheduled Meds: . enoxaparin (LOVENOX) injection  40 mg Subcutaneous Q24H  . HYDROmorphone   Intravenous Q4H  . hydroxyurea  500 mg Oral BID  . ketorolac  30 mg  Intravenous Q6H  . L-glutamine  15 g Oral BID  . senna-docusate  1 tablet Oral BID  . sodium chloride flush  3 mL Intravenous Q12H   Continuous Infusions: . dextrose 5 % and 0.45% NaCl 125 mL/hr at 01/21/17 0645    Principal Problem:   Sickle cell pain crisis [facility]) Active Problems:   Thrombocytosis (HCC)   Sickle cell anemia (HCC)    In excess of 25 min spent during this visit. Greater than 50% involved face to face contact with the patient for assessment, counseling and coordination of care.

## 2017-01-22 LAB — CBC WITH DIFFERENTIAL/PLATELET
BASOS ABS: 0 10*3/uL (ref 0.0–0.1)
Basophils Relative: 0 %
EOS PCT: 4 %
Eosinophils Absolute: 0.5 10*3/uL (ref 0.0–0.7)
HEMATOCRIT: 22 % — AB (ref 36.0–46.0)
Hemoglobin: 8 g/dL — ABNORMAL LOW (ref 12.0–15.0)
LYMPHS PCT: 41 %
Lymphs Abs: 5.5 10*3/uL — ABNORMAL HIGH (ref 0.7–4.0)
MCH: 29.2 pg (ref 26.0–34.0)
MCHC: 36.4 g/dL — AB (ref 30.0–36.0)
MCV: 80.3 fL (ref 78.0–100.0)
MONO ABS: 1.4 10*3/uL — AB (ref 0.1–1.0)
MONOS PCT: 11 %
Neutro Abs: 5.8 10*3/uL (ref 1.7–7.7)
Neutrophils Relative %: 44 %
PLATELETS: 492 10*3/uL — AB (ref 150–400)
RBC: 2.74 MIL/uL — ABNORMAL LOW (ref 3.87–5.11)
RDW: 17.7 % — AB (ref 11.5–15.5)
WBC: 13.2 10*3/uL — ABNORMAL HIGH (ref 4.0–10.5)

## 2017-01-22 LAB — BASIC METABOLIC PANEL
Anion gap: 5 (ref 5–15)
BUN: 6 mg/dL (ref 6–20)
CO2: 29 mmol/L (ref 22–32)
Calcium: 8.9 mg/dL (ref 8.9–10.3)
Chloride: 104 mmol/L (ref 101–111)
Creatinine, Ser: 0.41 mg/dL — ABNORMAL LOW (ref 0.44–1.00)
GFR calc Af Amer: >60 mL/min (ref >60)
GLUCOSE: 90 mg/dL (ref 65–99)
POTASSIUM: 3.4 mmol/L — AB (ref 3.5–5.1)
Sodium: 138 mmol/L (ref 135–145)

## 2017-01-22 LAB — RETICULOCYTES
RBC.: 2.74 MIL/uL — ABNORMAL LOW (ref 3.87–5.11)
RETIC COUNT ABSOLUTE: 287.7 10*3/uL — AB (ref 19.0–186.0)
Retic Ct Pct: 10.5 % — ABNORMAL HIGH (ref 0.4–3.1)

## 2017-01-22 NOTE — Progress Notes (Signed)
[place] PROGRESS NOTE  Carmen [name] LGX:[id number] DOB: 1991-01-01 DOA: 01/19/2017 PCP: [name], MD  Assessment/Plan: Principal Problem:   Sickle cell pain crisis [facility]) Active Problems:   Thrombocytosis (HCC)   Sickle cell anemia ([place])  1. Hb SS with crisis: No cahnge today. Continue PCA, Toradol, IVF and start on Endari 15 mg BID. Re-assess pain tomorrow.  2. Anemia: Hb stable. Continue to monitor. Chronic pain: Home medications.  Code Status: Full Code Family Communication: N/A Disposition Plan: Not yet ready for discharge  [facility]  Pager [phone]. If 7PM-7AM, please contact night-coverage.  01/22/2017, 4:32 PM  LOS: 3 days   Interim  History: Pt reports pain at level orf 9/10 and localized to arms, legs and back. Pt has used 38 mg with 42/38: demands/deliveries since admission.   Consultants:  None  Procedures:  None  Antibiotics:  None   Objective: Vitals:   01/19/17 1601 01/19/17 0021 01/20/17 0601 01/20/17 0901  BP: 109/67  99/70 113/76  Pulse: 66  (!) 56 (!) 54  Resp: 14 13 11 13   Temp: 97.7 F (36.5 C)  98.6 F (37 C) 98.5 F (36.9 C)  TempSrc: Axillary  Oral   SpO2: 92% 94% 93% 93%  Weight:      Height:       Weight change:   Intake/Output Summary (Last 24 hours) at 01/22/17 1632 Last data filed at 01/22/17 1059  Gross per 24 hour  Intake          4480.42 ml  Output                0 ml  Net          4480.42 ml    General: Alert, awake, oriented x3, in no acute distress.  HEENT: [place]/AT PEERL, EOMI, anicteric. Neck: Trachea midline,  no masses, no thyromegal,y no JVD, no carotid bruit OROPHARYNX:  Moist, No exudate/ erythema/lesions.  Heart: Regular rate and rhythm, without murmurs, rubs, gallops, PMI non-displaced, no heaves or thrills on palpation.  Lungs: Clear to auscultation, no wheezing or rhonchi noted. No increased vocal fremitus resonant to percussion.  Abdomen: Soft, nontender, nondistended, positive  bowel sounds, no masses no hepatosplenomegaly noted.  Neuro: No focal neurological deficits noted cranial nerves II through XII grossly intact.  Strength at functional baseline in bilateral upper and lower extremities. Musculoskeletal: No warmth swelling or erythema around joints, no spinal tenderness noted. Psychiatric: Patient alert and oriented x3, good insight and cognition, good recent to remote recall.    Data Reviewed: Basic Metabolic Panel:  Recent Labs Lab 01/16/17 2222 01/18/17 0116 01/19/17 0638 01/22/17 0444  NA 140 139 140 138  K 4.3 3.9 3.4* 3.4*  CL 110 105 107 104  CO2 25 24 22 29   GLUCOSE 90 86 82 90  BUN 10 12 10 6   CREATININE 0.42* 0.58 0.62 0.41*  CALCIUM 9.1 9.6 10.0 8.9   Liver Function Tests:  Recent Labs Lab 01/16/17 2222 01/18/17 0116 01/19/17 0638  AST 28 22 26   ALT 18 16 19   ALKPHOS 49 51 55  BILITOT 1.2 1.9* 2.5*  PROT 7.3 7.2 8.8*  ALBUMIN 3.9 4.1 4.8   No results for input(s): LIPASE, AMYLASE in the last 168 hours. No results for input(s): AMMONIA in the last 168 hours. CBC:  Recent Labs Lab 01/16/17 2014 01/18/17 0116 01/19/17 0638 01/22/17 0444  WBC 8.3 7.4 8.3 13.2*  NEUTROABS 2.5 2.1 3.7 5.8  HGB 10.1* 9.6* 10.1* 8.0*  HCT 27.9* 27.2* 28.2* 22.0*  MCV 83.0 83.4 81.7 80.3  PLT 579* 543* 601* 492*   Cardiac Enzymes: No results for input(s): CKTOTAL, CKMB, CKMBINDEX, TROPONINI in the last 168 hours. BNP (last 3 results) No results for input(s): BNP in the last 8760 hours.  ProBNP (last 3 results) No results for input(s): PROBNP in the last 8760 hours.  CBG: No results for input(s): GLUCAP in the last 168 hours.  No results found for this or any previous visit (from the past 240 hour(s)).   Studies: No results found.  Scheduled Meds: . enoxaparin (LOVENOX) injection  40 mg Subcutaneous Q24H  . HYDROmorphone   Intravenous Q4H  . hydroxyurea  500 mg Oral BID  . ketorolac  30 mg Intravenous Q6H  . L-glutamine  15  g Oral BID  . senna-docusate  1 tablet Oral BID  . sodium chloride flush  3 mL Intravenous Q12H   Continuous Infusions: . dextrose 5 % and 0.45% NaCl 10 mL/hr at 01/21/17 1150    Principal Problem:   Sickle cell pain crisis ([place]) Active Problems:   Thrombocytosis (HCC)   Sickle cell anemia (HCC)   In excess of 30 minutes spent during this visit. Greater than 50% involved face to face contact with the patient for assessment, counseling and coordination of care.

## 2017-01-23 LAB — RAPID URINE DRUG SCREEN, HOSP PERFORMED
AMPHETAMINES: NOT DETECTED
Barbiturates: NOT DETECTED
Benzodiazepines: NOT DETECTED
Cocaine: NOT DETECTED
Opiates: POSITIVE — AB
Tetrahydrocannabinol: POSITIVE — AB

## 2017-01-23 MED ORDER — OXYCODONE-ACETAMINOPHEN 5-325 MG PO TABS
1.0000 | ORAL_TABLET | Freq: Four times a day (QID) | ORAL | Status: DC
  Administered 2017-01-23 – 2017-01-24 (×4): 1 via ORAL
  Filled 2017-01-23 (×4): qty 1

## 2017-01-23 MED ORDER — OXYCODONE HCL 5 MG PO TABS
5.0000 mg | ORAL_TABLET | Freq: Four times a day (QID) | ORAL | Status: DC
  Administered 2017-01-23 – 2017-01-24 (×3): 5 mg via ORAL
  Filled 2017-01-23 (×3): qty 1

## 2017-01-23 MED ORDER — MORPHINE SULFATE ER 30 MG PO TBCR
60.0000 mg | EXTENDED_RELEASE_TABLET | Freq: Two times a day (BID) | ORAL | Status: DC
  Administered 2017-01-23 – 2017-01-25 (×5): 60 mg via ORAL
  Filled 2017-01-23 (×5): qty 2

## 2017-01-23 MED ORDER — OXYCODONE-ACETAMINOPHEN 10-325 MG PO TABS: 1.0000 | ORAL_TABLET | Freq: Four times a day (QID) | ORAL | Status: DC

## 2017-01-23 NOTE — Progress Notes (Signed)
[place] PROGRESS NOTE  Carmen [name] OLM:[id number] DOB: 28-Jul-1991 DOA: 01/19/2017 PCP: [name], MD  Assessment/Plan: Principal Problem:   Sickle cell pain crisis [facility]) Active Problems:   Thrombocytosis (HCC)   Sickle cell anemia ([place])  1. Hb SS with crisis: Patient has been having issues with her PCA and staff believe she is tampering with it. I will not change the dose today but instead Continue PCA, Toradol, IVF and continue Endari 15 mg BID. Start her home medications. Re-assess pain tomorrow.  2. Anemia: Hb stable. Continue to monitor. 3. Chronic pain: Resume Home medications.  Code Status: Full Code Family Communication: N/A Disposition Plan: Not yet ready for discharge  [facility]  Pager [phone]. If 7PM-7AM, please contact night-coverage.  01/23/2017, 7:41 PM  LOS: 4 days   Interim  History: Pt reports pain at still at the level of 8/10 and localized to arms, legs and back. Pt has used 40 mg with 42/40: demands/deliveries since admission.   Consultants:  None  Procedures:  None  Antibiotics:  None   Objective: Vitals:   01/19/17 1601 01/19/17 0021 01/20/17 0601 01/20/17 0901  BP: 109/67  99/70 113/76  Pulse: 66  (!) 56 (!) 54  Resp: 14 13 11 13   Temp: 97.7 F (36.5 C)  98.6 F (37 C) 98.5 F (36.9 C)  TempSrc: Axillary  Oral   SpO2: 92% 94% 93% 93%  Weight:      Height:       Weight change:   Intake/Output Summary (Last 24 hours) at 01/23/17 1941 Last data filed at 01/23/17 1857  Gross per 24 hour  Intake          1055.52 ml  Output              200 ml  Net           855.52 ml    General: Alert, awake, oriented x3, in no acute distress.  HEENT: [place]/AT PEERL, EOMI, anicteric. Neck: Trachea midline,  no masses, no thyromegal,y no JVD, no carotid bruit OROPHARYNX:  Moist, No exudate/ erythema/lesions.  Heart: Regular rate and rhythm, without murmurs, rubs, gallops, PMI non-displaced, no heaves or thrills on palpation.   Lungs: Clear to auscultation, no wheezing or rhonchi noted. No increased vocal fremitus resonant to percussion.  Abdomen: Soft, nontender, nondistended, positive bowel sounds, no masses no hepatosplenomegaly noted.  Neuro: No focal neurological deficits noted cranial nerves II through XII grossly intact.  Strength at functional baseline in bilateral upper and lower extremities. Musculoskeletal: No warmth swelling or erythema around joints, no spinal tenderness noted. Psychiatric: Patient alert and oriented x3, good insight and cognition, good recent to remote recall.    Data Reviewed: Basic Metabolic Panel:  Recent Labs Lab 01/16/17 2222 01/18/17 0116 01/19/17 0638 01/22/17 0444  NA 140 139 140 138  K 4.3 3.9 3.4* 3.4*  CL 110 105 107 104  CO2 25 24 22 29   GLUCOSE 90 86 82 90  BUN 10 12 10 6   CREATININE 0.42* 0.58 0.62 0.41*  CALCIUM 9.1 9.6 10.0 8.9   Liver Function Tests:  Recent Labs Lab 01/16/17 2222 01/18/17 0116 01/19/17 0638  AST 28 22 26   ALT 18 16 19   ALKPHOS 49 51 55  BILITOT 1.2 1.9* 2.5*  PROT 7.3 7.2 8.8*  ALBUMIN 3.9 4.1 4.8   No results for input(s): LIPASE, AMYLASE in the last 168 hours. No results for input(s): AMMONIA in the last 168 hours. CBC:  Recent  Labs Lab 01/16/17 2014 01/18/17 0116 01/19/17 0638 01/22/17 0444  WBC 8.3 7.4 8.3 13.2*  NEUTROABS 2.5 2.1 3.7 5.8  HGB 10.1* 9.6* 10.1* 8.0*  HCT 27.9* 27.2* 28.2* 22.0*  MCV 83.0 83.4 81.7 80.3  PLT 579* 543* 601* 492*   Cardiac Enzymes: No results for input(s): CKTOTAL, CKMB, CKMBINDEX, TROPONINI in the last 168 hours. BNP (last 3 results) No results for input(s): BNP in the last 8760 hours.  ProBNP (last 3 results) No results for input(s): PROBNP in the last 8760 hours.  CBG: No results for input(s): GLUCAP in the last 168 hours.  No results found for this or any previous visit (from the past 240 hour(s)).   Studies: No results found.  Scheduled Meds: . enoxaparin  (LOVENOX) injection  40 mg Subcutaneous Q24H  . HYDROmorphone   Intravenous Q4H  . hydroxyurea  500 mg Oral BID  . ketorolac  30 mg Intravenous Q6H  . L-glutamine  15 g Oral BID  . morphine  60 mg Oral Q12H  . oxyCODONE-acetaminophen  1 tablet Oral Q6H   And  . oxyCODONE  5 mg Oral Q6H  . senna-docusate  1 tablet Oral BID  . sodium chloride flush  3 mL Intravenous Q12H   Continuous Infusions: . dextrose 5 % and 0.45% NaCl 10 mL/hr at 01/23/17 0600    Principal Problem:   Sickle cell pain crisis ([place]) Active Problems:   Thrombocytosis (HCC)   Sickle cell anemia (HCC)   In excess of 30 minutes spent during this visit. Greater than 50% involved face to face contact with the patient for assessment, counseling and coordination of care.

## 2017-01-23 NOTE — Progress Notes (Signed)
Spoke to pt about IV channels being turned off. See documentation. Pt had a female figure present whom she stated was her finance. Pt was okay with me speaking to her in front of him. Told pt that "it has been brought to my attention that IV machines have been turned off." I also informed pt that only nurses were to mess with the IV channels. A white substance was cleaned off of computer keyboard in pts room witnessed by me.  Informed pt that it is our job to keep her safe.  Pt then began yelling about the situation. Pts nurse was present during conversation. Pts nurse paged MD about situation.

## 2017-01-23 NOTE — Progress Notes (Signed)
Writer arrived to work to receive report from night nurse regarding pt "turning off PCA O2 and Co2 pumps during the night more than once". There was also a "white powdery substance" on the keyboard of the computer in the room. Night nurse reported that pt and "boyfriend" were up most of night and spent most of time in bathroom. It was also reported to writer yesterday morning that pt and "boyfriend" were found in bathroom Friday night having sex. Pt and "boyfriend" argued (screaming and yelling) in room yesterday afternoon for couple of hours. Writer called AC this morning and AC and security arrived. AC and Probation officer talked with pt and boyfriend in room. Pt states she doesn't know how to turn machines off. Writer paged MD and made him aware. If pt turns off machines again, PCA is to be D/C.

## 2017-01-23 NOTE — Progress Notes (Signed)
Pt refused vital signs and spending long periods of time in bathroom. Upon checking PCA settings, pt had two of her three IV channels turned off. RN discussed importance of needing to be able to monitor her SpO2, respirations, etc while on PCA. Will continue to monitor.

## 2017-01-24 DIAGNOSIS — G894 Chronic pain syndrome: Secondary | ICD-10-CM

## 2017-01-24 DIAGNOSIS — D473 Essential (hemorrhagic) thrombocythemia: Secondary | ICD-10-CM

## 2017-01-24 LAB — CBC WITH DIFFERENTIAL/PLATELET
Basophils Absolute: 0 10*3/uL (ref 0.0–0.1)
Basophils Relative: 0 %
EOS PCT: 4 %
Eosinophils Absolute: 0.7 10*3/uL (ref 0.0–0.7)
HEMATOCRIT: 22.8 % — AB (ref 36.0–46.0)
Hemoglobin: 8.2 g/dL — ABNORMAL LOW (ref 12.0–15.0)
Lymphocytes Relative: 29 %
Lymphs Abs: 5.1 10*3/uL — ABNORMAL HIGH (ref 0.7–4.0)
MCH: 29.9 pg (ref 26.0–34.0)
MCHC: 36 g/dL (ref 30.0–36.0)
MCV: 83.2 fL (ref 78.0–100.0)
Monocytes Absolute: 1.8 10*3/uL — ABNORMAL HIGH (ref 0.1–1.0)
Monocytes Relative: 10 %
Neutro Abs: 10 10*3/uL — ABNORMAL HIGH (ref 1.7–7.7)
Neutrophils Relative %: 57 %
Platelets: 553 10*3/uL — ABNORMAL HIGH (ref 150–400)
RBC: 2.74 MIL/uL — AB (ref 3.87–5.11)
RDW: 19 % — AB (ref 11.5–15.5)
WBC: 17.6 10*3/uL — AB (ref 4.0–10.5)

## 2017-01-24 LAB — COMPREHENSIVE METABOLIC PANEL
ALBUMIN: 4.3 g/dL (ref 3.5–5.0)
ALT: 19 U/L (ref 14–54)
ANION GAP: 6 (ref 5–15)
AST: 26 U/L (ref 15–41)
Alkaline Phosphatase: 58 U/L (ref 38–126)
BUN: 6 mg/dL (ref 6–20)
CO2: 31 mmol/L (ref 22–32)
Calcium: 9.7 mg/dL (ref 8.9–10.3)
Chloride: 101 mmol/L (ref 101–111)
Creatinine, Ser: 0.43 mg/dL — ABNORMAL LOW (ref 0.44–1.00)
Glucose, Bld: 110 mg/dL — ABNORMAL HIGH (ref 65–99)
POTASSIUM: 3.3 mmol/L — AB (ref 3.5–5.1)
Sodium: 138 mmol/L (ref 135–145)
TOTAL PROTEIN: 7.4 g/dL (ref 6.5–8.1)
Total Bilirubin: 3.5 mg/dL — ABNORMAL HIGH (ref 0.3–1.2)

## 2017-01-24 LAB — RETICULOCYTES
RBC.: 2.73 MIL/uL — AB (ref 3.87–5.11)
RETIC COUNT ABSOLUTE: 384.9 10*3/uL — AB (ref 19.0–186.0)
Retic Ct Pct: 14.1 % — ABNORMAL HIGH (ref 0.4–3.1)

## 2017-01-24 LAB — MAGNESIUM: Magnesium: 1.6 mg/dL — ABNORMAL LOW (ref 1.7–2.4)

## 2017-01-24 LAB — GLUCOSE, CAPILLARY: Glucose-Capillary: 106 mg/dL — ABNORMAL HIGH (ref 65–99)

## 2017-01-24 MED ORDER — OXYCODONE HCL 5 MG PO TABS
5.0000 mg | ORAL_TABLET | ORAL | Status: DC
  Administered 2017-01-24 – 2017-01-25 (×7): 5 mg via ORAL
  Filled 2017-01-24 (×7): qty 1

## 2017-01-24 MED ORDER — OXYCODONE-ACETAMINOPHEN 5-325 MG PO TABS
1.0000 | ORAL_TABLET | ORAL | Status: DC
  Administered 2017-01-24 – 2017-01-25 (×7): 1 via ORAL
  Filled 2017-01-24 (×7): qty 1

## 2017-01-24 MED ORDER — HYDROMORPHONE 1 MG/ML IV SOLN
INTRAVENOUS | Status: DC
  Administered 2017-01-24: 9.7 mg via INTRAVENOUS
  Administered 2017-01-24: 9 mg via INTRAVENOUS
  Administered 2017-01-25: 11.2 mg via INTRAVENOUS
  Administered 2017-01-25: 7.7 mg via INTRAVENOUS
  Administered 2017-01-25: 10:00:00 via INTRAVENOUS
  Administered 2017-01-25: 0.7 mg via INTRAVENOUS
  Administered 2017-01-25: 9.1 mg via INTRAVENOUS
  Administered 2017-01-25: 11.2 mg via INTRAVENOUS
  Administered 2017-01-25: 4.9 mg via INTRAVENOUS
  Filled 2017-01-24 (×2): qty 25

## 2017-01-24 MED ORDER — POTASSIUM CHLORIDE CRYS ER 20 MEQ PO TBCR
40.0000 meq | EXTENDED_RELEASE_TABLET | Freq: Two times a day (BID) | ORAL | Status: DC
  Administered 2017-01-24 – 2017-01-25 (×3): 40 meq via ORAL
  Filled 2017-01-24 (×3): qty 2

## 2017-01-24 MED ORDER — MAGNESIUM CITRATE PO SOLN
1.0000 | Freq: Once | ORAL | Status: AC
  Administered 2017-01-24: 1 via ORAL
  Filled 2017-01-24: qty 296

## 2017-01-24 NOTE — Progress Notes (Signed)
This shift Probation officer took  Sleep medication to pt and she became very combative screaming about the nurses not understanding the pain of sickle cell patients and STATING the secret meetings RN's have about her. She became very loud  Questioning why this writer was asking  About sleep pattern or pain management. I addressed the issue of the PCA pump needing to be on, when I originally came in the room it was off. She reflected back on the issue she had with RN and staff on previous shift. At this time I contacted [facility] ([facility]) to speak with patient, as she has requested for me to be relieved of the duties of being her RN.

## 2017-01-24 NOTE — Progress Notes (Signed)
[place] PROGRESS NOTE  Carmen [name] VQM:[id number] DOB: 03/06/1991 DOA: 01/19/2017 PCP: [name], MD  Assessment/Plan: Principal Problem:   Sickle cell pain crisis [facility]) Active Problems:   Thrombocytosis (HCC)   Sickle cell anemia ([place])  1. Hb SS with crisis: Decrease PCA dose to 0.7 mg and increase frequency of Percocet to every 4 hours. Continue Endari and Toradol will be completed to day.  2. Anemia of Chronic Disease: Hb remains stable. Continue Hydrea.  3. Chronic pain: Continue MS Contin 4. Constipation: Pt has not had a BM in 2 days and is requesting Magnesium Citrate.    Code Status: Full Code Family Communication: N/A Disposition Plan: Anticipate discharge home tomorrow.   Carmen [name] A.  Pager [phone]. If 7PM-7AM, please contact night-coverage.  01/24/2017, 2:55 PM  LOS: 5 days   Interim  History: Pt reports pain at level orf 8-9/10 and localized to arms, legs and back. Pt has used 48.57 mg with 52/49: demands/deliveries in the last 24 hours. She has been up ambulating and anticipates discharge home tomorrow. Last BM 2 days ago.   Consultants:  None  Procedures:  None  Antibiotics:  None   Objective: Vitals:   01/19/17 1601 01/19/17 0021 01/20/17 0601 01/20/17 0901  BP: 109/67  99/70 113/76  Pulse: 66  (!) 56 (!) 54  Resp: 14 13 11 13   Temp: 97.7 F (36.5 C)  98.6 F (37 C) 98.5 F (36.9 C)  TempSrc: Axillary  Oral   SpO2: 92% 94% 93% 93%  Weight:      Height:       Weight change:   Intake/Output Summary (Last 24 hours) at 01/24/17 1455 Last data filed at 01/24/17 0400  Gross per 24 hour  Intake              480 ml  Output                0 ml  Net              480 ml    General: Alert, awake, oriented x3, in no acute distress.  HEENT: [place]/AT PEERL, EOMI, mild icterus. Neck: Trachea midline,  no masses, no thyromegal,y no JVD, no carotid bruit OROPHARYNX:  Moist, No exudate/ erythema/lesions.  Heart: Regular  rate and rhythm, without murmurs, rubs, gallops, PMI non-displaced, no heaves or thrills on palpation.  Lungs: Clear to auscultation, no wheezing or rhonchi noted. No increased vocal fremitus resonant to percussion.  Abdomen: Soft, nontender, nondistended, positive bowel sounds, no masses no hepatosplenomegaly noted.  Neuro: No focal neurological deficits noted cranial nerves II through XII grossly intact.  Strength at functional baseline in bilateral upper and lower extremities. Musculoskeletal: No warmth swelling or erythema around joints, no spinal tenderness noted. Psychiatric: Patient alert and oriented x3, good insight and cognition, good recent to remote recall.    Data Reviewed: Basic Metabolic Panel:  Recent Labs Lab 01/18/17 0116 01/19/17 0638 01/22/17 0444 01/24/17 0436  NA 139 140 138 138  K 3.9 3.4* 3.4* 3.3*  CL 105 107 104 101  CO2 24 22 29 31   GLUCOSE 86 82 90 110*  BUN 12 10 6 6   CREATININE 0.58 0.62 0.41* 0.43*  CALCIUM 9.6 10.0 8.9 9.7  MG  --   --   --  1.6*   Liver Function Tests:  Recent Labs Lab 01/18/17 0116 01/19/17 0638 01/24/17 0436  AST 22 26 26   ALT 16 19 19   ALKPHOS 51 55  58  BILITOT 1.9* 2.5* 3.5*  PROT 7.2 8.8* 7.4  ALBUMIN 4.1 4.8 4.3   No results for input(s): LIPASE, AMYLASE in the last 168 hours. No results for input(s): AMMONIA in the last 168 hours. CBC:  Recent Labs Lab 01/18/17 0116 01/19/17 0638 01/22/17 0444 01/24/17 0436  WBC 7.4 8.3 13.2* 17.6*  NEUTROABS 2.1 3.7 5.8 10.0*  HGB 9.6* 10.1* 8.0* 8.2*  HCT 27.2* 28.2* 22.0* 22.8*  MCV 83.4 81.7 80.3 83.2  PLT 543* 601* 492* 553*   Cardiac Enzymes: No results for input(s): CKTOTAL, CKMB, CKMBINDEX, TROPONINI in the last 168 hours. BNP (last 3 results) No results for input(s): BNP in the last 8760 hours.  ProBNP (last 3 results) No results for input(s): PROBNP in the last 8760 hours.  CBG: No results for input(s): GLUCAP in the last 168 hours.  No results  found for this or any previous visit (from the past 240 hour(s)).   Studies: No results found.  Scheduled Meds: . enoxaparin (LOVENOX) injection  40 mg Subcutaneous Q24H  . HYDROmorphone   Intravenous Q4H  . hydroxyurea  500 mg Oral BID  . ketorolac  30 mg Intravenous Q6H  . L-glutamine  15 g Oral BID  . morphine  60 mg Oral Q12H  . oxyCODONE-acetaminophen  1 tablet Oral Q4H   And  . oxyCODONE  5 mg Oral Q4H  . potassium chloride  40 mEq Oral BID  . senna-docusate  1 tablet Oral BID  . sodium chloride flush  3 mL Intravenous Q12H   Continuous Infusions: . dextrose 5 % and 0.45% NaCl 10 mL/hr at 01/23/17 0600    Principal Problem:   Sickle cell pain crisis [facility]) Active Problems:   Thrombocytosis (HCC)   Sickle cell anemia (HCC)   In excess of 30 minutes spent during this visit. Greater than 50% involved face to face contact with the patient for assessment, counseling and coordination of care.

## 2017-01-24 NOTE — Progress Notes (Signed)
Date:  January 24, 2017 Chart reviewed for concurrent status and case management needs. Will continue to follow patient progress. Discharge Planning: following for needs Expected discharge date: [id number] Sawyer Mentzer, BSN, RN3, CCM   [phone] 

## 2017-01-25 ENCOUNTER — Encounter: Payer: Self-pay | Admitting: Internal Medicine

## 2017-01-25 DIAGNOSIS — K5900 Constipation, unspecified: Secondary | ICD-10-CM

## 2017-01-25 MED ORDER — L-GLUTAMINE ORAL POWDER: 15.0000 g | PACK | Freq: Two times a day (BID) | ORAL | 1 refills | Status: DC

## 2017-01-25 NOTE — Progress Notes (Signed)
Date: January 25, 2017 Discharge orders review for case management needs.  None found Velva Harman, BSN, [place], [place]: [phone]

## 2017-01-25 NOTE — Discharge Summary (Addendum)
ILEE RANDLEMAN MRN: [id number] DOB/AGE: [birth year] 24 y.o.  Admit date: 01/19/2017 Discharge date: 01/25/2017  Primary Care Physician:  [name], MD   Discharge Diagnoses:   Patient Active Problem List   Diagnosis Date Noted  . Sickle cell anemia ([place]) 08/15/2016  . Breast lump 08/15/2016  . Anemia of chronic disease   . Sickle cell anemia with pain ([place]) 02/16/2015  . 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 S/P laparoscopic cholecystectomy 11/30/2014  . Hypokalemia 11/28/2014  . Leukocytosis 11/22/2014  . Thrombocytosis ([place]) 11/22/2014  . Sickle cell pain crisis ([place]) 11/12/2013    DISCHARGE MEDICATION: Allergies as of 01/25/2017   No Known Allergies     Medication List    TAKE these medications   L-glutamine 5 g Pack Powder Packet Commonly known as:  ENDARI Take 15 g by mouth 2 (two) times daily.   morphine 60 MG 12 hr tablet Commonly known as:  MS CONTIN Take 60 mg by mouth every 12 (twelve) hours.   oxyCODONE-acetaminophen 10-325 MG tablet Commonly known as:  PERCOCET Take 1 tablet by mouth every 4 (four) hours as needed for pain.         Consults:    SIGNIFICANT DIAGNOSTIC STUDIES:  No results found.     No results found for this or any previous visit (from the past 240 hour(s)).  BRIEF ADMITTING H & P: Carmen [name] is a 26 y.o. female with medical history significant of sickle cell disease, secondary anemia with occasional admissions for sickle cell pain crisis who initially presented to the emergency room 3 days ago when she started complaining of aches and pains, especially in her legs abdomen and arms and chest. Initially sent home, but then returned back again on 3/20. Sent home but then came back again early this morning 3/21 and referred over to the sickle cell day hospital. Her labs this morning noted thrombocytosis with platelet count of 601 and hemoglobin stable at 10.1. Patient was managed there with an IV  PCA pump. On this helped with her pain, but in the day when he sickle cell day hospital was closing, patient stated that her pain was still severe at a 9/10 so she went back to the emergency room. She was given 1 dose of Dilaudid IV and hospitalist will call for further evaluation and admission.   Hospital Course:  Present on Admission: . Sickle cell anemia ([place]) . Sickle cell pain crisis ([place]) . Thrombocytosis ([place])  Opiate tolerant patient with Hb SS admitted with sickle cell crisis. Although patient did not disclose this at the time of admission, she now reports at time of discharge that she is out of her pain medications which likely was the trigger for her crisis. However she was managed with Dilaudid via PCA, Toradol and IVF as her pain improved, she was transitioned to oral analgesics and is discharged home on her pre-hospital regimen. At the time of discharge she is ambulatory without difficulty. I have spoken with her PMD- Dr. Alyson Ingles and he will electronically send her prescription to her Pharmacy. Pt was also started on Endari (L-glutamine) 15 mgm BID which is a chronic medication used in decreasing complications of sickle cell disease and limiting intensity and frequency of sickle cell crises. Prescription sent to Pharmacy for month supply.   Pt is concerned about a breast mass which was originally noted in my records from 08/23/2016. By my estimation there has been no increase in the breast  mass. She was advised to follow up with her PMD. I am again advising patient to follow up with her PMD.  Pt had some mild constipation but had a BM prior to discharge.    Disposition and Follow-up: Pt is discharged home in good condition and is to follow up with her PMD on appointment scheduled on 01/31/2017.   Discharge Instructions    Activity as tolerated - No restrictions    Complete by:  As directed    Diet general    Complete by:  As directed       DISCHARGE EXAM:  General: Alert,  awake, oriented x3, in no apparent distress. Well appearing.  HEENT: [place]/AT PEERL, EOMI, anicteric Neck: Trachea midline, no masses, no thyromegal,y no JVD, no carotid bruit OROPHARYNX: Moist, No exudate/ erythema/lesions.  Heart: Regular rate and rhythm, without murmurs, rubs, gallops or S3. PMI non-displaced. Exam reveals no decreased pulses. Pulmonary/Chest: Normal effort. Breath sounds normal. No. Apnea. Clear to auscultation,no stridor,  no wheezing and no rhonchi noted. No respiratory distress and no tenderness noted. Breast: Right breast examined and patient noted to have a small mass in the 11:00 o'clock position of the right breast. It is non-tender, regular in shape, pea-sized and movable in nature. Abdomen: Soft, nontender, nondistended, normal bowel sounds, no masses no hepatosplenomegaly noted. No fluid wave and no ascites. There is no guarding or rebound. Neuro: Alert and oriented to person, place and time. Normal motor skills, Displays no atrophy or tremors and exhibits normal muscle tone.  No focal neurological deficits noted cranial nerves II through XII grossly intact. No sensory deficit noted. Strength at baseline in bilateral upper and lower extremities. Gait normal. Musculoskeletal: No warmth swelling or erythema around joints, no spinal tenderness noted. Psychiatric: Patient alert and oriented x3, good insight and cognition, good recent to remote recall. Mood, affect and judgement normal Skin: Skin is warm and dry. No bruising, no ecchymosis and no rash noted. Pt is not diaphoretic. No erythema. No pallor   Blood pressure (!) 107/53, pulse 74, temperature 98.4 F (36.9 C), temperature source Oral, resp. rate 14, height 5\' 3"  (1.6 m), weight 79.4 kg (175 lb), last menstrual period 01/04/2017, SpO2 95 %.   Recent Labs  01/24/17 0436  NA 138  K 3.3*  CL 101  CO2 31  GLUCOSE 110*  BUN 6  CREATININE 0.43*  CALCIUM 9.7  MG 1.6*    Recent Labs  01/24/17 0436  AST  26  ALT 19  ALKPHOS 58  BILITOT 3.5*  PROT 7.4  ALBUMIN 4.3   No results for input(s): LIPASE, AMYLASE in the last 72 hours.  Recent Labs  01/24/17 0436  WBC 17.6*  NEUTROABS 10.0*  HGB 8.2*  HCT 22.8*  MCV 83.2  PLT 553*     Total time spent including face to face and decision making was greater than 30 minutes  Signed: Caliegh Middlekauff A. 01/25/2017, 2:24 PM

## 2017-02-07 ENCOUNTER — Encounter (HOSPITAL_COMMUNITY): Payer: Self-pay | Admitting: Emergency Medicine

## 2017-02-07 ENCOUNTER — Emergency Department (HOSPITAL_COMMUNITY)
Admission: EM | Admit: 2017-02-07 | Discharge: 2017-02-07 | Disposition: A | Discharge status: Home / Self Care | Payer: Medicaid Other | Attending: Emergency Medicine | Admitting: Emergency Medicine

## 2017-02-07 DIAGNOSIS — D5700 Hb-SS disease with crisis, unspecified: Secondary | ICD-10-CM | POA: Diagnosis not present

## 2017-02-07 DIAGNOSIS — Z79899 Other long term (current) drug therapy: Secondary | ICD-10-CM | POA: Diagnosis not present

## 2017-02-07 LAB — CBC WITH DIFFERENTIAL/PLATELET
BASOS ABS: 0 10*3/uL (ref 0.0–0.1)
Basophils Relative: 0 %
EOS PCT: 1 %
Eosinophils Absolute: 0.1 10*3/uL (ref 0.0–0.7)
HEMATOCRIT: 24.9 % — AB (ref 36.0–46.0)
Hemoglobin: 8.8 g/dL — ABNORMAL LOW (ref 12.0–15.0)
LYMPHS PCT: 28 %
Lymphs Abs: 3.8 10*3/uL (ref 0.7–4.0)
MCH: 29.1 pg (ref 26.0–34.0)
MCHC: 35.3 g/dL (ref 30.0–36.0)
MCV: 82.5 fL (ref 78.0–100.0)
MONO ABS: 1.3 10*3/uL — AB (ref 0.1–1.0)
MONOS PCT: 9 %
Neutro Abs: 8.6 10*3/uL — ABNORMAL HIGH (ref 1.7–7.7)
Neutrophils Relative %: 62 %
PLATELETS: 641 10*3/uL — AB (ref 150–400)
RBC: 3.02 MIL/uL — ABNORMAL LOW (ref 3.87–5.11)
RDW: 15.8 % — AB (ref 11.5–15.5)
WBC: 13.8 10*3/uL — ABNORMAL HIGH (ref 4.0–10.5)

## 2017-02-07 LAB — RETICULOCYTES
RBC.: 3.02 MIL/uL — ABNORMAL LOW (ref 3.87–5.11)
Retic Count, Absolute: 211.4 10*3/uL — ABNORMAL HIGH (ref 19.0–186.0)
Retic Ct Pct: 7 % — ABNORMAL HIGH (ref 0.4–3.1)

## 2017-02-07 LAB — COMPREHENSIVE METABOLIC PANEL
ALBUMIN: 4.3 g/dL (ref 3.5–5.0)
ALK PHOS: 56 U/L (ref 38–126)
ALT: 16 U/L (ref 14–54)
AST: 20 U/L (ref 15–41)
Anion gap: 6 (ref 5–15)
BILIRUBIN TOTAL: 1.8 mg/dL — AB (ref 0.3–1.2)
BUN: 12 mg/dL (ref 6–20)
CALCIUM: 9.3 mg/dL (ref 8.9–10.3)
CO2: 25 mmol/L (ref 22–32)
Chloride: 108 mmol/L (ref 101–111)
Creatinine, Ser: 0.49 mg/dL (ref 0.44–1.00)
GFR calc Af Amer: >60 mL/min (ref >60)
GFR calc non Af Amer: >60 mL/min (ref >60)
Glucose, Bld: 82 mg/dL (ref 65–99)
POTASSIUM: 3.6 mmol/L (ref 3.5–5.1)
SODIUM: 139 mmol/L (ref 135–145)
TOTAL PROTEIN: 7.7 g/dL (ref 6.5–8.1)

## 2017-02-07 MED ORDER — HYDROMORPHONE HCL 2 MG/ML IJ SOLN: 2.0000 mg | INTRAMUSCULAR | Status: AC

## 2017-02-07 MED ORDER — HYDROMORPHONE HCL 2 MG/ML IJ SOLN: 2.0000 mg | INTRAMUSCULAR | Status: DC

## 2017-02-07 MED ORDER — HYDROMORPHONE HCL 2 MG/ML IJ SOLN
2.0000 mg | INTRAMUSCULAR | Status: AC
  Administered 2017-02-07: 2 mg via INTRAVENOUS
  Filled 2017-02-07: qty 1

## 2017-02-07 MED ORDER — KETOROLAC TROMETHAMINE 30 MG/ML IJ SOLN
30.0000 mg | INTRAMUSCULAR | Status: AC
  Administered 2017-02-07: 30 mg via INTRAVENOUS
  Filled 2017-02-07: qty 1

## 2017-02-07 NOTE — ED Notes (Signed)
Bed: WA21 Expected date:  Expected time:  Means of arrival:  Comments: EMS- sickle cell  
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
## 2017-02-07 NOTE — ED Notes (Signed)
IV team consulted. Waiting for IV team to arrive on unit. Patient aware.

## 2017-02-07 NOTE — ED Notes (Signed)
Patient given cup of water.
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
## 2017-02-07 NOTE — Discharge Instructions (Signed)
Get plenty of rest, and drink a lot of fluids.  Follow-up with your doctor, as needed, for problems.

## 2017-02-07 NOTE — ED Notes (Signed)
Discharge instructions and follow up care reviewed with patient. Patient verbalized understanding. Patient given bus pass and [place] sandwich.

## 2017-02-07 NOTE — ED Notes (Signed)
Attempt US guided IV to R AC unsuccessful. Corey Skains primary RN made aware.

## 2017-02-07 NOTE — ED Notes (Addendum)
IV team at bedside. 2 IV attempts unsuccessful. Another RN to bedside to attempt IV insertion.

## 2017-02-10 ENCOUNTER — Emergency Department (HOSPITAL_COMMUNITY): Payer: Medicaid Other

## 2017-02-10 ENCOUNTER — Encounter (HOSPITAL_COMMUNITY): Payer: Self-pay | Admitting: Emergency Medicine

## 2017-02-10 ENCOUNTER — Emergency Department (HOSPITAL_COMMUNITY)
Admission: EM | Admit: 2017-02-10 | Discharge: 2017-02-10 | Disposition: A | Discharge status: Home / Self Care | Payer: Medicaid Other | Attending: Emergency Medicine | Admitting: Emergency Medicine

## 2017-02-10 DIAGNOSIS — D5700 Hb-SS disease with crisis, unspecified: Secondary | ICD-10-CM | POA: Insufficient documentation

## 2017-02-10 LAB — CBC WITH DIFFERENTIAL/PLATELET
Basophils Absolute: 0 10*3/uL (ref 0.0–0.1)
Basophils Relative: 0 %
Eosinophils Absolute: 0.1 10*3/uL (ref 0.0–0.7)
Eosinophils Relative: 0 %
HCT: 25.9 % — ABNORMAL LOW (ref 36.0–46.0)
Hemoglobin: 9.4 g/dL — ABNORMAL LOW (ref 12.0–15.0)
LYMPHS PCT: 15 %
Lymphs Abs: 2.6 10*3/uL (ref 0.7–4.0)
MCH: 30.5 pg (ref 26.0–34.0)
MCHC: 36.3 g/dL — AB (ref 30.0–36.0)
MCV: 84.1 fL (ref 78.0–100.0)
MONO ABS: 1.5 10*3/uL — AB (ref 0.1–1.0)
MONOS PCT: 9 %
Neutro Abs: 13.2 10*3/uL — ABNORMAL HIGH (ref 1.7–7.7)
Neutrophils Relative %: 76 %
Platelets: 691 10*3/uL — ABNORMAL HIGH (ref 150–400)
RBC: 3.08 MIL/uL — ABNORMAL LOW (ref 3.87–5.11)
RDW: 15.9 % — AB (ref 11.5–15.5)
WBC: 17.4 10*3/uL — ABNORMAL HIGH (ref 4.0–10.5)

## 2017-02-10 LAB — COMPREHENSIVE METABOLIC PANEL
ALBUMIN: 4.2 g/dL (ref 3.5–5.0)
ALK PHOS: 64 U/L (ref 38–126)
ALT: 16 U/L (ref 14–54)
ANION GAP: 6 (ref 5–15)
AST: 26 U/L (ref 15–41)
BUN: 9 mg/dL (ref 6–20)
CO2: 27 mmol/L (ref 22–32)
Calcium: 9.3 mg/dL (ref 8.9–10.3)
Chloride: 111 mmol/L (ref 101–111)
Creatinine, Ser: 0.59 mg/dL (ref 0.44–1.00)
GFR calc Af Amer: >60 mL/min (ref >60)
GFR calc non Af Amer: >60 mL/min (ref >60)
GLUCOSE: 84 mg/dL (ref 65–99)
POTASSIUM: 3.6 mmol/L (ref 3.5–5.1)
SODIUM: 144 mmol/L (ref 135–145)
Total Bilirubin: 1.8 mg/dL — ABNORMAL HIGH (ref 0.3–1.2)
Total Protein: 7.8 g/dL (ref 6.5–8.1)

## 2017-02-10 LAB — RETICULOCYTES
RBC.: 3.08 MIL/uL — ABNORMAL LOW (ref 3.87–5.11)
Retic Count, Absolute: 271 10*3/uL — ABNORMAL HIGH (ref 19.0–186.0)
Retic Ct Pct: 8.8 % — ABNORMAL HIGH (ref 0.4–3.1)

## 2017-02-10 MED ORDER — DIPHENHYDRAMINE HCL 25 MG PO CAPS: 25.0000 mg | ORAL_CAPSULE | ORAL | Status: DC | PRN

## 2017-02-10 MED ORDER — HYDROMORPHONE HCL 2 MG/ML IJ SOLN: 2.0000 mg | INTRAMUSCULAR | Status: AC

## 2017-02-10 MED ORDER — HYDROMORPHONE HCL 2 MG/ML IJ SOLN
2.0000 mg | INTRAMUSCULAR | Status: DC
  Administered 2017-02-10 (×3): 2 mg via INTRAVENOUS
  Filled 2017-02-10 (×3): qty 1

## 2017-02-10 MED ORDER — ONDANSETRON HCL 4 MG/2ML IJ SOLN
4.0000 mg | INTRAMUSCULAR | Status: DC | PRN
Start: 2017-02-10 — End: 2017-02-10

## 2017-02-10 MED ORDER — SODIUM CHLORIDE 0.45 % IV SOLN
INTRAVENOUS | Status: DC
  Administered 2017-02-10: 16:00:00 via INTRAVENOUS

## 2017-02-10 MED ORDER — HYDROMORPHONE HCL 2 MG/ML IJ SOLN
2.0000 mg | INTRAMUSCULAR | Status: AC
  Administered 2017-02-10: 2 mg via INTRAVENOUS
  Filled 2017-02-10: qty 1

## 2017-02-10 MED ORDER — HYDROMORPHONE HCL 2 MG/ML IJ SOLN: 2.0000 mg | INTRAMUSCULAR | Status: DC

## 2017-02-10 NOTE — ED Notes (Signed)
This writer attempted twice to collect labs unsuccessful. PT info me she normally have to get [place] IV in past

## 2017-02-10 NOTE — ED Notes (Signed)
IV removed.  Cath intact

## 2017-02-10 NOTE — ED Triage Notes (Addendum)
Pt c/o sickle cell pain crisis since Monday also with sore throat beginning today. Reports mild chest pain. Emesis x2 with cough. Denies fever or shortness of breath. A/O at triage NAD.

## 2017-02-10 NOTE — ED Provider Notes (Signed)
[place] DEPT Provider Note   CSN: [id number] Arrival date & time: 02/10/17  1414     History   Chief Complaint Chief Complaint  Patient presents with  . Sickle Cell Pain Crisis    HPI Carmen [name] is a 26 y.o. female.  Carmen [name] is a 26 y.o. Female with a history of sickle cell anemia who presents to the emergency department complaining of a sickle cell pain crisis for the past 3 days. Patient reports generalized body aches but is worse to her legs and some mild chest pain. She reports taking her MS Contin and Percocet without relief. She reports some mild chest pain and some coughing. She denies any shortness of breath or fevers. She reports this feels like her typical sickle cell pain crisis. She denies fevers, shortness of breath, hemoptysis, abdominal pain, nausea, vomiting, hip pain, leg swelling, urinary symptoms or rashes.    The history is provided by the patient and medical records. No language interpreter was used.  Sickle Cell Pain Crisis  Associated symptoms: chest pain and cough   Associated symptoms: no congestion, no fever, no headaches, no nausea, no shortness of breath, no sore throat, no vomiting and no wheezing     Past Medical History:  Diagnosis Date  . Abortion    05/2012  . Headache(784.0)   . Sickle cell crisis [facility])     Patient Active Problem List   Diagnosis Date Noted  . Sickle cell anemia (Carmen [name]) 08/15/2016  . Breast lump 08/15/2016  . Anemia of chronic disease   . Sickle cell anemia with pain (Carmen [name]) 02/16/2015  . 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 S/P laparoscopic cholecystectomy 11/30/2014  . Hypokalemia 11/28/2014  . Leukocytosis 11/22/2014  . Thrombocytosis (Carmen [name]) 11/22/2014  . Sickle cell pain crisis (Carmen [name]) 11/12/2013    Past Surgical History:  Procedure Laterality Date  . CHOLECYSTECTOMY N/A 11/30/2014   Procedure: LAPAROSCOPIC CHOLECYSTECTOMY SINGLE SITE WITH INTRAOPERATIVE CHOLANGIOGRAM;   Surgeon: Michael Boston, MD;  Location: [facility];  Service: General;  Laterality: N/A;  . SPLENECTOMY      OB History    Gravida Para Term Preterm AB Living   1       1     SAB TAB Ectopic Multiple Live Births                   Home Medications    Prior to Admission medications   Medication Sig Start Date End Date Taking? Authorizing Provider  morphine (MS CONTIN) 60 MG 12 hr tablet Take 60 mg by mouth 2 (two) times daily as needed for pain.    Yes Historical Provider, MD  oxyCODONE-acetaminophen (PERCOCET) 10-325 MG tablet Take 1 tablet by mouth every 4 (four) hours as needed for pain.   Yes Historical Provider, MD  L-glutamine (ENDARI) 5 g PACK Powder Packet Take 15 g by mouth 2 (two) times daily. Patient not taking: Reported on 02/07/2017 01/25/17   Leana Gamer, MD    Family History Family History  Problem Relation Age of Onset  . Hypertension Mother   . Sickle cell anemia Sister   . Kidney disease Sister     Lupus  . Arthritis Sister   . Sickle cell anemia Sister   . Sickle cell trait Sister   . Heart disease Maternal Aunt     CABG  . Heart disease Maternal Uncle     CABG  . Lupus Sister  Social History Social History  Substance Use Topics  . Smoking status: Never Smoker  . Smokeless tobacco: Never Used  . Alcohol use No     Allergies   Patient has no known allergies.   Review of Systems Review of Systems  Constitutional: Negative for chills and fever.  HENT: Negative for congestion and sore throat.   Eyes: Negative for visual disturbance.  Respiratory: Positive for cough. Negative for shortness of breath and wheezing.   Cardiovascular: Positive for chest pain. Negative for palpitations and leg swelling.  Gastrointestinal: Negative for abdominal pain, diarrhea, nausea and vomiting.  Genitourinary: Negative for difficulty urinating and dysuria.  Musculoskeletal: Positive for back pain. Negative for joint swelling, neck pain and neck stiffness.    Skin: Negative for rash.  Neurological: Negative for weakness, light-headedness, numbness and headaches.     Physical Exam Updated Vital Signs BP 109/70   Pulse 95   Temp 98.8 F (37.1 C) (Axillary)   Resp 20   Ht 5\' 3"  (1.6 m)   Wt 79.2 kg   LMP 02/08/2017   SpO2 97%   BMI 30.93 kg/m   Physical Exam  Constitutional: She is oriented to person, place, and time. She appears well-developed and well-nourished. No distress.  Nontoxic appearing.  HENT:  Head: Normocephalic and atraumatic.  Mouth/Throat: Oropharynx is clear and moist.  Eyes: Conjunctivae are normal. Pupils are equal, round, and reactive to light. Right eye exhibits no discharge. Left eye exhibits no discharge.  Neck: Neck supple. No JVD present.  Cardiovascular: Regular rhythm, normal heart sounds and intact distal pulses.  Exam reveals no gallop and no friction rub.   No murmur heard. Heart rate is 104. Bilateral radial, posterior tibialis and dorsalis pedis pulses are intact.    Pulmonary/Chest: Effort normal and breath sounds normal. No stridor. No respiratory distress. She has no wheezes. She has no rales. She exhibits no tenderness.  Lungs are clear to ascultation bilaterally. Symmetric chest expansion bilaterally. No increased work of breathing. No rales or rhonchi.    Abdominal: Soft. There is no tenderness. There is no guarding.  Musculoskeletal: She exhibits no edema or tenderness.  No lower extremity edema or tenderness. Patient's bilateral wrist, elbow, shoulder, hip, knee and ankle joints are supple and without erythema or edema.  Lymphadenopathy:    She has no cervical adenopathy.  Neurological: She is alert and oriented to person, place, and time. No sensory deficit. Coordination normal.  Skin: Skin is warm and dry. Capillary refill takes less than 2 seconds. No rash noted. She is not diaphoretic. No erythema. No pallor.  Psychiatric: She has a normal mood and affect. Her behavior is normal.  Nursing  note and vitals reviewed.    ED Treatments / Results  Labs (all labs ordered are listed, but only abnormal results are displayed) Labs Reviewed  COMPREHENSIVE METABOLIC PANEL - Abnormal; Notable for the following:       Result Value   Total Bilirubin 1.8 (*)    All other components within normal limits  CBC WITH DIFFERENTIAL/PLATELET - Abnormal; Notable for the following:    WBC 17.4 (*)    RBC 3.08 (*)    Hemoglobin 9.4 (*)    HCT 25.9 (*)    MCHC 36.3 (*)    RDW 15.9 (*)    Platelets 691 (*)    Neutro Abs 13.2 (*)    Monocytes Absolute 1.5 (*)    All other components within normal limits  RETICULOCYTES - Abnormal; Notable for  the following:    Retic Ct Pct 8.8 (*)    RBC. 3.08 (*)    Retic Count, Manual 271.0 (*)    All other components within normal limits    EKG  EKG Interpretation  Date/Time:  Thursday February 10 2017 16:22:41 EDT Ventricular Rate:  101 PR Interval:    QRS Duration: 88 QT Interval:  323 QTC Calculation: 419 R Axis:   58 Text Interpretation:  Sinus tachycardia No STEMI.  Confirmed by LONG MD, JOSHUA [phone]) on 02/10/2017 4:31:52 PM       Radiology Dg Chest 2 View  Result Date: 02/10/2017 CLINICAL DATA:  Sickle cell crisis, chest pain for 1 day EXAM: CHEST  2 VIEW COMPARISON:  Chest x-ray of 12/21/2016 FINDINGS: No active infiltrate or effusion is seen. Mediastinal and hilar contours are unremarkable. The heart is within normal limits in size. No bony abnormality is seen. IMPRESSION: No active cardiopulmonary disease. Electronically Signed   By: Ivar Drape M.D.   On: 02/10/2017 14:55    Procedures Procedures (including critical care time)  Medications Ordered in ED Medications  0.45 % sodium chloride infusion ( Intravenous Stopped 02/10/17 2045)  HYDROmorphone (DILAUDID) injection 2 mg (2 mg Intravenous Given 02/10/17 2030)    Or  HYDROmorphone (DILAUDID) injection 2 mg ( Subcutaneous See Alternative 02/10/17 2030)  diphenhydrAMINE (BENADRYL)  capsule 25-50 mg (not administered)  ondansetron (ZOFRAN) injection 4 mg (not administered)  HYDROmorphone (DILAUDID) injection 2 mg (2 mg Intravenous Given 02/10/17 1620)    Or  HYDROmorphone (DILAUDID) injection 2 mg ( Subcutaneous See Alternative 02/10/17 1620)  HYDROmorphone (DILAUDID) injection 2 mg (2 mg Intravenous Given 02/10/17 1656)    Or  HYDROmorphone (DILAUDID) injection 2 mg ( Subcutaneous See Alternative 02/10/17 1656)  HYDROmorphone (DILAUDID) injection 2 mg (2 mg Intravenous Given 02/10/17 1732)    Or  HYDROmorphone (DILAUDID) injection 2 mg ( Subcutaneous See Alternative 02/10/17 1732)     Initial Impression / Assessment and Plan / ED Course  I have reviewed the triage vital signs and the nursing notes.  Pertinent labs & imaging results that were available during my care of the patient were reviewed by me and considered in my medical decision making (see chart for details).    This  is a 26 y.o. Female with a history of sickle cell anemia who presents to the emergency department complaining of a sickle cell pain crisis for the past 3 days. Patient reports generalized body aches but is worse to her legs and some mild chest pain. She reports taking her MS Contin and Percocet without relief. She reports some mild chest pain and some coughing. She denies any shortness of breath or fevers. She reports this feels like her typical sickle cell pain crisis. On exam patient is afebrile nontoxic appearing. Heart rate is mildly elevated at 104. Lungs clear to auscultation bilaterally. Joints are supple and without erythema or edema. Chest x-ray is unremarkable. Hemoglobin is 9.4 which is an improvement from her baseline. Patient received sickle cell pain protocol. At reevaluation patient reports she is feeling better and ready for discharge. She will follow-up with her sickle cell provider. Family to come and pick her up take her home. I discussed strict and specific return precautions. I  advised the patient to follow-up with their primary care provider this week. I advised the patient to return to the emergency department with new or worsening symptoms or new concerns. The patient verbalized understanding and agreement with plan.  Final Clinical Impressions(s) / ED Diagnoses   Final diagnoses:  Sickle cell pain crisis [facility])    New Prescriptions New Prescriptions   No medications on file     Waynetta Pean, Hershal Coria 02/10/17 2051    Margette Fast, MD 02/11/17 1017

## 2017-02-11 ENCOUNTER — Emergency Department (HOSPITAL_COMMUNITY)
Admission: EM | Admit: 2017-02-11 | Discharge: 2017-02-12 | Disposition: A | Discharge status: Home / Self Care | Payer: Medicaid Other | Attending: Emergency Medicine | Admitting: Emergency Medicine

## 2017-02-11 ENCOUNTER — Encounter (HOSPITAL_COMMUNITY): Payer: Self-pay | Admitting: Emergency Medicine

## 2017-02-11 DIAGNOSIS — J069 Acute upper respiratory infection, unspecified: Secondary | ICD-10-CM

## 2017-02-11 DIAGNOSIS — D5700 Hb-SS disease with crisis, unspecified: Secondary | ICD-10-CM | POA: Insufficient documentation

## 2017-02-11 LAB — COMPREHENSIVE METABOLIC PANEL
ALBUMIN: 4.1 g/dL (ref 3.5–5.0)
ALT: 14 U/L (ref 14–54)
AST: 22 U/L (ref 15–41)
Alkaline Phosphatase: 57 U/L (ref 38–126)
Anion gap: 7 (ref 5–15)
BILIRUBIN TOTAL: 1.5 mg/dL — AB (ref 0.3–1.2)
BUN: 8 mg/dL (ref 6–20)
CALCIUM: 9.1 mg/dL (ref 8.9–10.3)
CHLORIDE: 108 mmol/L (ref 101–111)
CO2: 25 mmol/L (ref 22–32)
CREATININE: 0.54 mg/dL (ref 0.44–1.00)
GFR calc Af Amer: >60 mL/min (ref >60)
GLUCOSE: 93 mg/dL (ref 65–99)
POTASSIUM: 3.7 mmol/L (ref 3.5–5.1)
Sodium: 140 mmol/L (ref 135–145)
Total Protein: 7.8 g/dL (ref 6.5–8.1)

## 2017-02-11 LAB — CBC WITH DIFFERENTIAL/PLATELET
BASOS PCT: 0 %
Basophils Absolute: 0 10*3/uL (ref 0.0–0.1)
EOS ABS: 0.1 10*3/uL (ref 0.0–0.7)
EOS PCT: 2 %
HCT: 25.6 % — ABNORMAL LOW (ref 36.0–46.0)
HEMOGLOBIN: 8.9 g/dL — AB (ref 12.0–15.0)
LYMPHS ABS: 3.6 10*3/uL (ref 0.7–4.0)
Lymphocytes Relative: 42 %
MCH: 28.9 pg (ref 26.0–34.0)
MCHC: 34.8 g/dL (ref 30.0–36.0)
MCV: 83.1 fL (ref 78.0–100.0)
MONO ABS: 1.1 10*3/uL — AB (ref 0.1–1.0)
MONOS PCT: 13 %
NEUTROS PCT: 43 %
Neutro Abs: 3.8 10*3/uL (ref 1.7–7.7)
PLATELETS: 707 10*3/uL — AB (ref 150–400)
RBC: 3.08 MIL/uL — ABNORMAL LOW (ref 3.87–5.11)
RDW: 15.9 % — AB (ref 11.5–15.5)
WBC: 8.7 10*3/uL (ref 4.0–10.5)

## 2017-02-11 LAB — RETICULOCYTES
RBC.: 3.08 MIL/uL — AB (ref 3.87–5.11)
RETIC CT PCT: 8.5 % — AB (ref 0.4–3.1)
Retic Count, Absolute: 261.8 10*3/uL — ABNORMAL HIGH (ref 19.0–186.0)

## 2017-02-11 MED ORDER — SODIUM CHLORIDE 0.45 % IV SOLN
INTRAVENOUS | Status: DC
  Administered 2017-02-11: via INTRAVENOUS

## 2017-02-11 MED ORDER — KETOROLAC TROMETHAMINE 15 MG/ML IJ SOLN
15.0000 mg | INTRAMUSCULAR | Status: AC
  Administered 2017-02-11: 15 mg via INTRAVENOUS
  Filled 2017-02-11: qty 1

## 2017-02-11 MED ORDER — HYDROMORPHONE HCL 2 MG/ML IJ SOLN
2.0000 mg | INTRAMUSCULAR | Status: AC
  Administered 2017-02-11: 2 mg via INTRAVENOUS
  Filled 2017-02-11: qty 1

## 2017-02-11 MED ORDER — HYDROMORPHONE HCL 2 MG/ML IJ SOLN
0.5000 mg | Freq: Once | INTRAMUSCULAR | Status: AC
  Administered 2017-02-11: 0.5 mg via SUBCUTANEOUS
  Filled 2017-02-11: qty 1

## 2017-02-11 MED ORDER — HYDROMORPHONE HCL 2 MG/ML IJ SOLN
2.0000 mg | INTRAMUSCULAR | Status: AC
Start: 2017-02-12 — End: 2017-02-11

## 2017-02-11 MED ORDER — DIPHENHYDRAMINE HCL 50 MG/ML IJ SOLN
25.0000 mg | Freq: Once | INTRAMUSCULAR | Status: AC
Start: 2017-02-11 — End: 2017-02-11
  Administered 2017-02-11: 25 mg via INTRAVENOUS
  Filled 2017-02-11: qty 1

## 2017-02-11 NOTE — ED Provider Notes (Signed)
[place] DEPT Provider Note   CSN: [id number] Arrival date & time: 02/11/17  2058    By signing my name below, I, Macon Large, attest that this documentation has been prepared under the direction and in the presence of Davonna Belling, MD. Electronically Signed: Macon Large, ED Scribe. 02/11/17. 12:18 AM.  History   Chief Complaint Chief Complaint  Patient presents with  . Sickle Cell Pain Crisis   The history is provided by the patient. No language interpreter was used.   HPI Comments: Carmen [name] is a 26 y.o. female with PMHx of sickle cell anemia who presents to the Emergency Department complaining of 10/10, gradually worsening, constant, generalized body aches onset three days ago. Pt notes her pain tonight mimics her typical sickle cell anemia pain crisis. Pt was last seen in the ED on 04/12 for same complaints and had chest imaging completed with unremarkable results. Pt was given Dilaudid in the ED with minimal relief. She was given sickle cell pain protocol and advised to to f/u with PCP if new or worsening symptoms persisted. She reports associated intermittent chills accompanied by nasal congestion, rhinorrhea and persistent, productive cough with sputum that began earlier today. She described her sputum as a "greenish" color. Pt reports taking prescribed percocet followed by NyQuil with minimal relief. Per pt, she reports having recent sick contact at home. Pt denies SOB, nausea, vomiting, diarrhea.   Past Medical History:  Diagnosis Date  . Abortion    05/2012  . Headache(784.0)   . Sickle cell crisis [facility])     Patient Active Problem List   Diagnosis Date Noted  . Sickle cell anemia ([place]) 08/15/2016  . Breast lump 08/15/2016  . Anemia of chronic disease   . Sickle cell anemia with pain ([place]) 02/16/2015  . 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 S/P laparoscopic cholecystectomy 11/30/2014  . Hypokalemia 11/28/2014  .  Leukocytosis 11/22/2014  . Thrombocytosis ([place]) 11/22/2014  . Sickle cell pain crisis ([place]) 11/12/2013    Past Surgical History:  Procedure Laterality Date  . CHOLECYSTECTOMY N/A 11/30/2014   Procedure: LAPAROSCOPIC CHOLECYSTECTOMY SINGLE SITE WITH INTRAOPERATIVE CHOLANGIOGRAM;  Surgeon: Michael Boston, MD;  Location: [facility];  Service: General;  Laterality: N/A;  . SPLENECTOMY      OB History    Gravida Para Term Preterm AB Living   1       1     SAB TAB Ectopic Multiple Live Births                   Home Medications    Prior to Admission medications   Medication Sig Start Date End Date Taking? Authorizing Provider  DM-Doxylamine-Acetaminophen (NYQUIL COLD & FLU PO) Take 2 tablets by mouth 2 (two) times daily as needed (cold symptoms).   Yes Historical Provider, MD  morphine (MS CONTIN) 60 MG 12 hr tablet Take 60 mg by mouth 2 (two) times daily as needed for pain.    Yes Historical Provider, MD  oxyCODONE-acetaminophen (PERCOCET) 10-325 MG tablet Take 1 tablet by mouth every 4 (four) hours as needed for pain.   Yes Historical Provider, MD  L-glutamine (ENDARI) 5 g PACK Powder Packet Take 15 g by mouth 2 (two) times daily. Patient not taking: Reported on 02/07/2017 01/25/17   Leana Gamer, MD    Family History Family History  Problem Relation Age of Onset  . Hypertension Mother   . Sickle cell anemia Sister   .  Kidney disease Sister     Lupus  . Arthritis Sister   . Sickle cell anemia Sister   . Sickle cell trait Sister   . Heart disease Maternal Aunt     CABG  . Heart disease Maternal Uncle     CABG  . Lupus Sister     Social History Social History  Substance Use Topics  . Smoking status: Never Smoker  . Smokeless tobacco: Never Used  . Alcohol use No     Allergies   Patient has no known allergies.   Review of Systems Review of Systems  Constitutional: Positive for chills.  HENT: Positive for congestion and rhinorrhea.   Respiratory: Positive for  cough. Negative for shortness of breath.   Gastrointestinal: Negative for diarrhea, nausea and vomiting.  Musculoskeletal: Positive for myalgias (generalized).  All other systems reviewed and are negative.    Physical Exam Updated Vital Signs BP 102/72   Pulse 66   Temp 98.9 F (37.2 C) (Oral)   Resp 18   Wt 175 lb (79.4 kg)   LMP 02/08/2017   SpO2 95%   BMI 31.00 kg/m   Physical Exam  Constitutional: She appears well-developed and well-nourished.  HENT:  Head: Normocephalic and atraumatic.  Nasal congestion. Slight posterior pharyngeal erythema without exudate. Uvula is midline. No asymmetric swelling. Bilateral cervical adenopathy.   Eyes: Conjunctivae are normal. Right eye exhibits no discharge. Left eye exhibits no discharge.  Pulmonary/Chest: Effort normal. No respiratory distress. She has no wheezes. She has no rales.  No CVA tenderness.   Lymphadenopathy:    She has cervical adenopathy.  Neurological: She is alert. Coordination normal.  Skin: Skin is warm and dry. No rash noted. She is not diaphoretic. No erythema.  Psychiatric: She has a normal mood and affect.  Nursing note and vitals reviewed.    ED Treatments / Results   DIAGNOSTIC STUDIES: Oxygen Saturation is 99% on RA, normal by my interpretation.    COORDINATION OF CARE: 11:16 PM Discussed treatment plan with pt at bedside which includes labs and pain medication and pt agreed to plan.   Labs (all labs ordered are listed, but only abnormal results are displayed) Labs Reviewed  COMPREHENSIVE METABOLIC PANEL - Abnormal; Notable for the following:       Result Value   Total Bilirubin 1.5 (*)    All other components within normal limits  CBC WITH DIFFERENTIAL/PLATELET - Abnormal; Notable for the following:    RBC 3.08 (*)    Hemoglobin 8.9 (*)    HCT 25.6 (*)    RDW 15.9 (*)    Platelets 707 (*)    Monocytes Absolute 1.1 (*)    All other components within normal limits  RETICULOCYTES - Abnormal;  Notable for the following:    Retic Ct Pct 8.5 (*)    RBC. 3.08 (*)    Retic Count, Manual 261.8 (*)    All other components within normal limits    EKG  EKG Interpretation None       Radiology Dg Chest 2 View  Result Date: 02/10/2017 CLINICAL DATA:  Sickle cell crisis, chest pain for 1 day EXAM: CHEST  2 VIEW COMPARISON:  Chest x-ray of 12/21/2016 FINDINGS: No active infiltrate or effusion is seen. Mediastinal and hilar contours are unremarkable. The heart is within normal limits in size. No bony abnormality is seen. IMPRESSION: No active cardiopulmonary disease. Electronically Signed   By: Ivar Drape M.D.   On: 02/10/2017 14:55  Procedures Procedures (including critical care time)  Medications Ordered in ED Medications  0.45 % sodium chloride infusion ( Intravenous Stopped 02/12/17 0232)  HYDROmorphone (DILAUDID) injection 0.5 mg (0.5 mg Subcutaneous Given 02/11/17 2211)  HYDROmorphone (DILAUDID) injection 2 mg (2 mg Intravenous Given 02/11/17 2352)    Or  HYDROmorphone (DILAUDID) injection 2 mg ( Subcutaneous See Alternative 02/11/17 2352)  diphenhydrAMINE (BENADRYL) injection 25 mg (25 mg Intravenous Given 02/11/17 2352)  ketorolac (TORADOL) 15 MG/ML injection 15 mg (15 mg Intravenous Given 02/11/17 2352)  HYDROmorphone (DILAUDID) injection 2 mg (2 mg Intravenous Given 02/12/17 0115)  HYDROmorphone (DILAUDID) injection 2 mg (2 mg Intravenous Given 02/12/17 0146)  HYDROmorphone (DILAUDID) injection 2 mg (2 mg Intravenous Given 02/12/17 0221)     Initial Impression / Assessment and Plan / ED Course  I have reviewed the triage vital signs and the nursing notes.  Pertinent labs & imaging results that were available during my care of the patient were reviewed by me and considered in my medical decision making (see chart for details).     Patient with URI and sickle cell pain. X-ray negative yesterday. Labs stable. She has had 8 mg of Dilaudid and is still hurting. Will admit to  internal medicine. After 3 doses initially felt as if she could be discharged after one more dose, but then stated that she was feeling worse.  Final Clinical Impressions(s) / ED Diagnoses   Final diagnoses:  Upper respiratory tract infection, unspecified type  Sickle cell anemia with pain ([place])    New Prescriptions New Prescriptions   No medications on file    I personally performed the services described in this documentation, which was scribed in my presence. The recorded information has been reviewed and is accurate.   Patient later was not willing to stay to be admitted and wanted to leave.      Davonna Belling, MD 02/17/17 [phone]

## 2017-02-11 NOTE — ED Triage Notes (Signed)
Patient reports generalized sickle cell pain to whole body. States pain mimics typical sickle cell pain. Denies chest pain and SOB. Patient also reports productive cough and congestion x48 hours. Denies abdominal pain and N/V/D.

## 2017-02-11 NOTE — ED Notes (Signed)
Called pt's name to get vitals, but she was getting her blood drawn.

## 2017-02-12 ENCOUNTER — Encounter (HOSPITAL_COMMUNITY): Payer: Self-pay

## 2017-02-12 ENCOUNTER — Emergency Department (HOSPITAL_COMMUNITY)
Admission: EM | Admit: 2017-02-12 | Discharge: 2017-02-13 | Disposition: A | Discharge status: Home / Self Care | Payer: Medicaid Other | Source: Home / Self Care | Attending: Emergency Medicine | Admitting: Emergency Medicine

## 2017-02-12 DIAGNOSIS — D5700 Hb-SS disease with crisis, unspecified: Secondary | ICD-10-CM

## 2017-02-12 LAB — RETICULOCYTES
RBC.: 2.76 MIL/uL — ABNORMAL LOW (ref 3.87–5.11)
Retic Count, Absolute: 207 10*3/uL — ABNORMAL HIGH (ref 19.0–186.0)
Retic Ct Pct: 7.5 % — ABNORMAL HIGH (ref 0.4–3.1)

## 2017-02-12 LAB — CBC WITH DIFFERENTIAL/PLATELET
Basophils Absolute: 0 10*3/uL (ref 0.0–0.1)
Basophils Relative: 0 %
EOS ABS: 0.3 10*3/uL (ref 0.0–0.7)
EOS PCT: 2 %
HCT: 23 % — ABNORMAL LOW (ref 36.0–46.0)
HEMOGLOBIN: 8.3 g/dL — AB (ref 12.0–15.0)
LYMPHS PCT: 37 %
Lymphs Abs: 3.9 10*3/uL (ref 0.7–4.0)
MCH: 30.1 pg (ref 26.0–34.0)
MCHC: 36.1 g/dL — ABNORMAL HIGH (ref 30.0–36.0)
MCV: 83.3 fL (ref 78.0–100.0)
Monocytes Absolute: 1.2 10*3/uL — ABNORMAL HIGH (ref 0.1–1.0)
Monocytes Relative: 11 %
NEUTROS PCT: 50 %
Neutro Abs: 5.4 10*3/uL (ref 1.7–7.7)
PLATELETS: 656 10*3/uL — AB (ref 150–400)
RBC: 2.76 MIL/uL — AB (ref 3.87–5.11)
RDW: 15 % (ref 11.5–15.5)
WBC: 10.7 10*3/uL — AB (ref 4.0–10.5)

## 2017-02-12 LAB — COMPREHENSIVE METABOLIC PANEL
ALT: 15 U/L (ref 14–54)
AST: 22 U/L (ref 15–41)
Albumin: 3.7 g/dL (ref 3.5–5.0)
Alkaline Phosphatase: 56 U/L (ref 38–126)
Anion gap: 7 (ref 5–15)
BILIRUBIN TOTAL: 1.4 mg/dL — AB (ref 0.3–1.2)
BUN: 8 mg/dL (ref 6–20)
CHLORIDE: 107 mmol/L (ref 101–111)
CO2: 25 mmol/L (ref 22–32)
Calcium: 8.7 mg/dL — ABNORMAL LOW (ref 8.9–10.3)
Creatinine, Ser: 0.47 mg/dL (ref 0.44–1.00)
GFR calc Af Amer: >60 mL/min (ref >60)
Glucose, Bld: 103 mg/dL — ABNORMAL HIGH (ref 65–99)
POTASSIUM: 3.5 mmol/L (ref 3.5–5.1)
Sodium: 139 mmol/L (ref 135–145)
TOTAL PROTEIN: 7 g/dL (ref 6.5–8.1)

## 2017-02-12 MED ORDER — SODIUM CHLORIDE 0.45 % IV SOLN
INTRAVENOUS | Status: DC
Start: 2017-02-12 — End: 2017-02-13
  Administered 2017-02-12: 21:00:00 via INTRAVENOUS

## 2017-02-12 MED ORDER — HYDROMORPHONE HCL 2 MG/ML IJ SOLN
2.0000 mg | Freq: Once | INTRAMUSCULAR | Status: AC
  Administered 2017-02-12: 2 mg via INTRAVENOUS
  Filled 2017-02-12: qty 1

## 2017-02-12 MED ORDER — KETOROLAC TROMETHAMINE 30 MG/ML IJ SOLN
30.0000 mg | INTRAMUSCULAR | Status: AC
  Administered 2017-02-12: 30 mg via INTRAVENOUS
  Filled 2017-02-12: qty 1

## 2017-02-12 MED ORDER — HYDROMORPHONE HCL 2 MG/ML IJ SOLN
2.0000 mg | Freq: Once | INTRAMUSCULAR | Status: AC
Start: 2017-02-12 — End: 2017-02-12
  Administered 2017-02-12: 2 mg via INTRAVENOUS
  Filled 2017-02-12: qty 1

## 2017-02-12 MED ORDER — HYDROMORPHONE HCL 2 MG/ML IJ SOLN
2.0000 mg | INTRAMUSCULAR | Status: AC
  Administered 2017-02-12: 2 mg via SUBCUTANEOUS

## 2017-02-12 MED ORDER — HYDROMORPHONE HCL 2 MG/ML IJ SOLN: 2.0000 mg | INTRAMUSCULAR | Status: DC

## 2017-02-12 MED ORDER — HYDROMORPHONE HCL 2 MG/ML IJ SOLN: 2.0000 mg | INTRAMUSCULAR | Status: AC

## 2017-02-12 MED ORDER — DIPHENHYDRAMINE HCL 50 MG/ML IJ SOLN
25.0000 mg | Freq: Once | INTRAMUSCULAR | Status: AC
  Administered 2017-02-12: 25 mg via INTRAVENOUS
  Filled 2017-02-12: qty 1

## 2017-02-12 MED ORDER — ONDANSETRON HCL 4 MG/2ML IJ SOLN
4.0000 mg | INTRAMUSCULAR | Status: DC | PRN
  Administered 2017-02-12: 4 mg via INTRAVENOUS
  Filled 2017-02-12: qty 2

## 2017-02-12 MED ORDER — HYDROMORPHONE HCL 2 MG/ML IJ SOLN
2.0000 mg | INTRAMUSCULAR | Status: DC
  Administered 2017-02-12 (×2): 2 mg via INTRAVENOUS
  Filled 2017-02-12 (×2): qty 1

## 2017-02-12 MED ORDER — HYDROMORPHONE HCL 2 MG/ML IJ SOLN
2.0000 mg | INTRAMUSCULAR | Status: AC
  Administered 2017-02-12: 2 mg via INTRAVENOUS
  Filled 2017-02-12: qty 1

## 2017-02-12 MED ORDER — OXYCODONE-ACETAMINOPHEN 5-325 MG PO TABS
2.0000 | ORAL_TABLET | Freq: Once | ORAL | Status: AC
  Administered 2017-02-13: 2 via ORAL
  Filled 2017-02-12: qty 2

## 2017-02-12 MED ORDER — HYDROMORPHONE HCL 2 MG/ML IJ SOLN
2.0000 mg | INTRAMUSCULAR | Status: AC
  Filled 2017-02-12: qty 1

## 2017-02-12 NOTE — Discharge Instructions (Signed)
See your Physician for recheck.  °

## 2017-02-12 NOTE — ED Notes (Signed)
Iv team in room 

## 2017-02-12 NOTE — ED Triage Notes (Signed)
She c/o persistent sickle cell pain; generalized, esp. Of lower half of body.

## 2017-02-13 NOTE — ED Provider Notes (Signed)
[place] DEPT Provider Note   CSN: [id number] Arrival date & time: 02/12/17  1541     History   Chief Complaint Chief Complaint  Patient presents with  . Sickle Cell Pain Crisis    HPI Carmen [name] is a 26 y.o. female.  The history is provided by the patient. No language interpreter was used.  Sickle Cell Pain Crisis  Location:  Lower extremity Severity:  Severe Duration:  2 days Similar to previous crisis episodes: yes   Timing:  Constant Progression:  Worsening Chronicity:  New Sickle cell genotype:  Unable to specify History of pulmonary emboli: no   Relieved by:  Nothing Worsened by:  Nothing Ineffective treatments:  Prescription drugs Associated symptoms: no chest pain   Risk factors: frequent pain crises     Past Medical History:  Diagnosis Date  . Abortion    05/2012  . Headache(784.0)   . Sickle cell crisis [facility])     Patient Active Problem List   Diagnosis Date Noted  . Sickle cell anemia ([place]) 08/15/2016  . Breast lump 08/15/2016  . Anemia of chronic disease   . Sickle cell anemia with pain ([place]) 02/16/2015  . 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 S/P laparoscopic cholecystectomy 11/30/2014  . Hypokalemia 11/28/2014  . Leukocytosis 11/22/2014  . Thrombocytosis ([place]) 11/22/2014  . Sickle cell pain crisis ([place]) 11/12/2013    Past Surgical History:  Procedure Laterality Date  . CHOLECYSTECTOMY N/A 11/30/2014   Procedure: LAPAROSCOPIC CHOLECYSTECTOMY SINGLE SITE WITH INTRAOPERATIVE CHOLANGIOGRAM;  Surgeon: Michael Boston, MD;  Location: [facility];  Service: General;  Laterality: N/A;  . SPLENECTOMY      OB History    Gravida Para Term Preterm AB Living   1       1     SAB TAB Ectopic Multiple Live Births                   Home Medications    Prior to Admission medications   Medication Sig Start Date End Date Taking? Authorizing Provider  DM-Doxylamine-Acetaminophen (NYQUIL COLD & FLU PO) Take 2 tablets by  mouth 2 (two) times daily as needed (cold symptoms).   Yes Historical Provider, MD  morphine (MS CONTIN) 60 MG 12 hr tablet Take 60 mg by mouth 2 (two) times daily as needed for pain.    Yes Historical Provider, MD  oxyCODONE-acetaminophen (PERCOCET) 10-325 MG tablet Take 1 tablet by mouth every 4 (four) hours as needed for pain.   Yes Historical Provider, MD  L-glutamine (ENDARI) 5 g PACK Powder Packet Take 15 g by mouth 2 (two) times daily. Patient not taking: Reported on 02/07/2017 01/25/17   Leana Gamer, MD    Family History Family History  Problem Relation Age of Onset  . Hypertension Mother   . Sickle cell anemia Sister   . Kidney disease Sister     Lupus  . Arthritis Sister   . Sickle cell anemia Sister   . Sickle cell trait Sister   . Heart disease Maternal Aunt     CABG  . Heart disease Maternal Uncle     CABG  . Lupus Sister     Social History Social History  Substance Use Topics  . Smoking status: Never Smoker  . Smokeless tobacco: Never Used  . Alcohol use No     Allergies   Patient has no known allergies.   Review of Systems Review of Systems  Cardiovascular:  Negative for chest pain.  All other systems reviewed and are negative.    Physical Exam Updated Vital Signs BP 103/77 (BP Location: Left Arm)   Pulse 69   Temp 98.6 F (37 C) (Oral)   Resp 16   Ht 5\' 3"  (1.6 m)   Wt 78.7 kg   LMP 02/08/2017   SpO2 95%   BMI 30.73 kg/m   Physical Exam  Constitutional: She appears well-developed and well-nourished. No distress.  HENT:  Head: Normocephalic and atraumatic.  Eyes: Conjunctivae are normal.  Neck: Neck supple.  Cardiovascular: Normal rate and regular rhythm.   No murmur heard. Pulmonary/Chest: Effort normal and breath sounds normal. No respiratory distress.  Abdominal: Soft. There is no tenderness.  Musculoskeletal: She exhibits no edema.  Neurological: She is alert.  Skin: Skin is warm and dry.  Psychiatric: She has a normal mood  and affect.  Nursing note and vitals reviewed.    ED Treatments / Results  Labs (all labs ordered are listed, but only abnormal results are displayed) Labs Reviewed  COMPREHENSIVE METABOLIC PANEL - Abnormal; Notable for the following:       Result Value   Glucose, Bld 103 (*)    Calcium 8.7 (*)    Total Bilirubin 1.4 (*)    All other components within normal limits  CBC WITH DIFFERENTIAL/PLATELET - Abnormal; Notable for the following:    WBC 10.7 (*)    RBC 2.76 (*)    Hemoglobin 8.3 (*)    HCT 23.0 (*)    MCHC 36.1 (*)    Platelets 656 (*)    Monocytes Absolute 1.2 (*)    All other components within normal limits  RETICULOCYTES - Abnormal; Notable for the following:    Retic Ct Pct 7.5 (*)    RBC. 2.76 (*)    Retic Count, Manual 207.0 (*)    All other components within normal limits    EKG  EKG Interpretation None       Radiology No results found.  Procedures Procedures (including critical care time)  Medications Ordered in ED Medications  0.45 % sodium chloride infusion ( Intravenous Stopped 02/13/17 0025)  HYDROmorphone (DILAUDID) injection 2 mg (2 mg Intravenous Given 02/12/17 2253)    Or  HYDROmorphone (DILAUDID) injection 2 mg ( Subcutaneous See Alternative 02/12/17 2253)  ondansetron (ZOFRAN) injection 4 mg (4 mg Intravenous Given 02/12/17 2031)  ketorolac (TORADOL) 30 MG/ML injection 30 mg (30 mg Intravenous Given 02/12/17 2032)  HYDROmorphone (DILAUDID) injection 2 mg ( Intravenous See Alternative 02/12/17 1824)    Or  HYDROmorphone (DILAUDID) injection 2 mg (2 mg Subcutaneous Given 02/12/17 1824)  HYDROmorphone (DILAUDID) injection 2 mg (2 mg Intravenous Given 02/12/17 2032)    Or  HYDROmorphone (DILAUDID) injection 2 mg ( Subcutaneous See Alternative 02/12/17 2032)  HYDROmorphone (DILAUDID) injection 2 mg (2 mg Intravenous Given 02/12/17 2110)    Or  HYDROmorphone (DILAUDID) injection 2 mg ( Subcutaneous See Alternative 02/12/17 2110)  diphenhydrAMINE  (BENADRYL) injection 25 mg (25 mg Intravenous Given 02/12/17 2031)  oxyCODONE-acetaminophen (PERCOCET/ROXICET) 5-325 MG per tablet 2 tablet (2 tablets Oral Given 02/13/17 0015)     Initial Impression / Assessment and Plan / ED Course  I have reviewed the triage vital signs and the nursing notes.  Pertinent labs & imaging results that were available during my care of the patient were reviewed by me and considered in my medical decision making (see chart for details).     Pt given medications per sickle  cell protocol.  Pt given Iv fluids.  Pt reports pain much better.  Pt feels better and wants to go home.  Pt advised to continue home medications.  Pt advised to see her Md for recheck  Final Clinical Impressions(s) / ED Diagnoses   Final diagnoses:  Sickle cell pain crisis [facility])    New Prescriptions Discharge Medication List as of 02/12/2017 11:16 PM    An After Visit Summary was printed and given to the patient.    Hollace Kinnier Alpine, PA-C 02/13/17 0034    Merrily Pew, MD 02/13/17 0110

## 2017-02-19 ENCOUNTER — Emergency Department (HOSPITAL_COMMUNITY)
Admission: EM | Admit: 2017-02-19 | Discharge: 2017-02-19 | Disposition: A | Discharge status: Home / Self Care | Payer: Medicaid Other | Attending: Emergency Medicine | Admitting: Emergency Medicine

## 2017-02-19 ENCOUNTER — Encounter (HOSPITAL_COMMUNITY): Payer: Self-pay | Admitting: Emergency Medicine

## 2017-02-19 DIAGNOSIS — D5700 Hb-SS disease with crisis, unspecified: Secondary | ICD-10-CM | POA: Diagnosis not present

## 2017-02-19 DIAGNOSIS — Z79899 Other long term (current) drug therapy: Secondary | ICD-10-CM | POA: Insufficient documentation

## 2017-02-19 LAB — COMPREHENSIVE METABOLIC PANEL
ALK PHOS: 56 U/L (ref 38–126)
ALT: 20 U/L (ref 14–54)
ANION GAP: 4 — AB (ref 5–15)
AST: 34 U/L (ref 15–41)
Albumin: 3.6 g/dL (ref 3.5–5.0)
BILIRUBIN TOTAL: 0.9 mg/dL (ref 0.3–1.2)
BUN: 13 mg/dL (ref 6–20)
CALCIUM: 8.5 mg/dL — AB (ref 8.9–10.3)
CO2: 24 mmol/L (ref 22–32)
CREATININE: 0.43 mg/dL — AB (ref 0.44–1.00)
Chloride: 111 mmol/L (ref 101–111)
Glucose, Bld: 86 mg/dL (ref 65–99)
Potassium: 4.4 mmol/L (ref 3.5–5.1)
SODIUM: 139 mmol/L (ref 135–145)
TOTAL PROTEIN: 7 g/dL (ref 6.5–8.1)

## 2017-02-19 LAB — CBC WITH DIFFERENTIAL/PLATELET
BASOS ABS: 0 10*3/uL (ref 0.0–0.1)
BASOS PCT: 0 %
EOS ABS: 0.1 10*3/uL (ref 0.0–0.7)
Eosinophils Relative: 1 %
HEMATOCRIT: 27.7 % — AB (ref 36.0–46.0)
HEMOGLOBIN: 9.8 g/dL — AB (ref 12.0–15.0)
Lymphocytes Relative: 25 %
Lymphs Abs: 3 10*3/uL (ref 0.7–4.0)
MCH: 29.5 pg (ref 26.0–34.0)
MCHC: 35.4 g/dL (ref 30.0–36.0)
MCV: 83.4 fL (ref 78.0–100.0)
MONOS PCT: 11 %
Monocytes Absolute: 1.4 10*3/uL — ABNORMAL HIGH (ref 0.1–1.0)
NEUTROS ABS: 7.7 10*3/uL (ref 1.7–7.7)
Neutrophils Relative %: 63 %
Platelets: 419 10*3/uL — ABNORMAL HIGH (ref 150–400)
RBC: 3.32 MIL/uL — ABNORMAL LOW (ref 3.87–5.11)
RDW: 15.6 % — ABNORMAL HIGH (ref 11.5–15.5)
WBC: 12.2 10*3/uL — AB (ref 4.0–10.5)

## 2017-02-19 LAB — POC URINE PREG, ED: Preg Test, Ur: NEGATIVE

## 2017-02-19 LAB — RETICULOCYTES
RBC.: 3.32 MIL/uL — AB (ref 3.87–5.11)
RETIC COUNT ABSOLUTE: 268.9 10*3/uL — AB (ref 19.0–186.0)
RETIC CT PCT: 8.1 % — AB (ref 0.4–3.1)

## 2017-02-19 MED ORDER — HYDROMORPHONE HCL 1 MG/ML IJ SOLN: 2.0000 mg | INTRAMUSCULAR | Status: AC

## 2017-02-19 MED ORDER — DEXTROSE-NACL 5-0.45 % IV SOLN
INTRAVENOUS | Status: DC
  Administered 2017-02-19: 12:00:00 via INTRAVENOUS

## 2017-02-19 MED ORDER — HYDROMORPHONE HCL 1 MG/ML IJ SOLN
2.0000 mg | INTRAMUSCULAR | Status: AC
Start: 2017-02-19 — End: 2017-02-19
  Administered 2017-02-19: 2 mg via INTRAVENOUS
  Filled 2017-02-19: qty 2

## 2017-02-19 MED ORDER — KETOROLAC TROMETHAMINE 30 MG/ML IJ SOLN
30.0000 mg | INTRAMUSCULAR | Status: AC
  Administered 2017-02-19: 30 mg via INTRAVENOUS
  Filled 2017-02-19: qty 1

## 2017-02-19 MED ORDER — DIPHENHYDRAMINE HCL 50 MG/ML IJ SOLN
25.0000 mg | Freq: Once | INTRAMUSCULAR | Status: AC
  Administered 2017-02-19: 25 mg via INTRAVENOUS
  Filled 2017-02-19: qty 1

## 2017-02-19 MED ORDER — HYDROMORPHONE HCL 1 MG/ML IJ SOLN
2.0000 mg | INTRAMUSCULAR | Status: AC
  Administered 2017-02-19: 2 mg via INTRAVENOUS
  Filled 2017-02-19: qty 2

## 2017-02-19 MED ORDER — HYDROMORPHONE HCL 1 MG/ML IJ SOLN: 2.0000 mg | INTRAMUSCULAR | Status: DC

## 2017-02-19 MED ORDER — HYDROMORPHONE HCL 1 MG/ML IJ SOLN
2.0000 mg | INTRAMUSCULAR | Status: DC
  Administered 2017-02-19: 2 mg via INTRAVENOUS
  Filled 2017-02-19: qty 2
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
## 2017-02-19 NOTE — ED Triage Notes (Signed)
Pt reports worsening sickle cell pain since yesterday. Generalized body pain, worst in back.

## 2017-02-19 NOTE — Discharge Instructions (Signed)
Return for worsening symptoms.    

## 2017-02-23 ENCOUNTER — Encounter (HOSPITAL_COMMUNITY): Payer: Self-pay

## 2017-02-23 ENCOUNTER — Emergency Department (HOSPITAL_COMMUNITY)
Admission: EM | Admit: 2017-02-23 | Discharge: 2017-02-23 | Disposition: A | Discharge status: Home / Self Care | Payer: Medicaid Other | Attending: Emergency Medicine | Admitting: Emergency Medicine

## 2017-02-23 DIAGNOSIS — D5700 Hb-SS disease with crisis, unspecified: Secondary | ICD-10-CM | POA: Insufficient documentation

## 2017-02-23 LAB — CBC WITH DIFFERENTIAL/PLATELET
BASOS ABS: 0 10*3/uL (ref 0.0–0.1)
BASOS PCT: 0 %
EOS ABS: 0.1 10*3/uL (ref 0.0–0.7)
EOS PCT: 1 %
HCT: 29.9 % — ABNORMAL LOW (ref 36.0–46.0)
Hemoglobin: 10.4 g/dL — ABNORMAL LOW (ref 12.0–15.0)
Lymphocytes Relative: 24 %
Lymphs Abs: 3 10*3/uL (ref 0.7–4.0)
MCH: 28.7 pg (ref 26.0–34.0)
MCHC: 34.8 g/dL (ref 30.0–36.0)
MCV: 82.6 fL (ref 78.0–100.0)
MONO ABS: 1.2 10*3/uL — AB (ref 0.1–1.0)
Monocytes Relative: 10 %
NEUTROS ABS: 8.3 10*3/uL — AB (ref 1.7–7.7)
Neutrophils Relative %: 65 %
PLATELETS: 636 10*3/uL — AB (ref 150–400)
RBC: 3.62 MIL/uL — ABNORMAL LOW (ref 3.87–5.11)
RDW: 15.3 % (ref 11.5–15.5)
WBC: 12.6 10*3/uL — ABNORMAL HIGH (ref 4.0–10.5)

## 2017-02-23 LAB — COMPREHENSIVE METABOLIC PANEL
ALBUMIN: 4.5 g/dL (ref 3.5–5.0)
ALK PHOS: 59 U/L (ref 38–126)
ALT: 24 U/L (ref 14–54)
ANION GAP: 10 (ref 5–15)
AST: 31 U/L (ref 15–41)
BILIRUBIN TOTAL: 1.2 mg/dL (ref 0.3–1.2)
BUN: 14 mg/dL (ref 6–20)
CALCIUM: 9.8 mg/dL (ref 8.9–10.3)
CO2: 25 mmol/L (ref 22–32)
Chloride: 105 mmol/L (ref 101–111)
Creatinine, Ser: 0.53 mg/dL (ref 0.44–1.00)
GFR calc Af Amer: >60 mL/min (ref >60)
GFR calc non Af Amer: >60 mL/min (ref >60)
Glucose, Bld: 101 mg/dL — ABNORMAL HIGH (ref 65–99)
Potassium: 4.5 mmol/L (ref 3.5–5.1)
Sodium: 140 mmol/L (ref 135–145)
TOTAL PROTEIN: 8.8 g/dL — AB (ref 6.5–8.1)

## 2017-02-23 LAB — RETICULOCYTES
RBC.: 3.62 MIL/uL — AB (ref 3.87–5.11)
RETIC CT PCT: 7 % — AB (ref 0.4–3.1)
Retic Count, Absolute: 253.4 10*3/uL — ABNORMAL HIGH (ref 19.0–186.0)

## 2017-02-23 MED ORDER — HYDROMORPHONE HCL 1 MG/ML IJ SOLN
2.0000 mg | INTRAMUSCULAR | Status: AC
  Administered 2017-02-23: 2 mg via INTRAVENOUS
  Filled 2017-02-23: qty 2

## 2017-02-23 MED ORDER — KETOROLAC TROMETHAMINE 30 MG/ML IJ SOLN
30.0000 mg | INTRAMUSCULAR | Status: AC
  Administered 2017-02-23: 30 mg via INTRAVENOUS
  Filled 2017-02-23: qty 1

## 2017-02-23 MED ORDER — HYDROMORPHONE HCL 1 MG/ML IJ SOLN: 2.0000 mg | INTRAMUSCULAR | Status: AC

## 2017-02-23 MED ORDER — SODIUM CHLORIDE 0.45 % IV SOLN
INTRAVENOUS | Status: DC
  Administered 2017-02-23: 09:00:00 via INTRAVENOUS

## 2017-02-23 MED ORDER — HYDROMORPHONE HCL 1 MG/ML IJ SOLN
2.0000 mg | INTRAMUSCULAR | Status: AC
Start: 2017-02-23 — End: 2017-02-23
  Administered 2017-02-23: 2 mg via INTRAVENOUS
  Filled 2017-02-23: qty 2

## 2017-02-23 MED ORDER — DIPHENHYDRAMINE HCL 50 MG/ML IJ SOLN
25.0000 mg | Freq: Once | INTRAMUSCULAR | Status: AC
  Administered 2017-02-23: 25 mg via INTRAVENOUS
  Filled 2017-02-23: qty 1

## 2017-02-23 MED ORDER — HYDROMORPHONE HCL 1 MG/ML IJ SOLN
2.0000 mg | Freq: Once | INTRAMUSCULAR | Status: AC
  Administered 2017-02-23: 2 mg via INTRAVENOUS
  Filled 2017-02-23: qty 2

## 2017-02-23 MED ORDER — HYDROMORPHONE HCL 1 MG/ML IJ SOLN
2.0000 mg | INTRAMUSCULAR | Status: AC
Start: 2017-02-23 — End: 2017-02-23

## 2017-02-23 NOTE — ED Notes (Signed)
Pt requesting ultrasound IV 
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
## 2017-02-23 NOTE — ED Triage Notes (Signed)
Pt reports right sided body pain r/t sickle cell. Pt denies taking Rx or OTC pain medication at home. Pt denies cp. Pt denies sob.

## 2017-02-23 NOTE — ED Notes (Signed)
Discharge instructions and follow up care reviewed with patient. Patient verbalized understanding. 

## 2017-02-23 NOTE — ED Notes (Signed)
A RN attempted ultrasound IV x2 without success. Another RN to bedside to attempt IV insertion.

## 2017-02-23 NOTE — ED Notes (Signed)
Patient reports pain as 8 or 9. Patient requesting discharge paperwork. EDP made aware.

## 2017-02-27 ENCOUNTER — Emergency Department (HOSPITAL_COMMUNITY)
Admission: EM | Admit: 2017-02-27 | Discharge: 2017-02-27 | Disposition: A | Discharge status: Home / Self Care | Payer: Medicaid Other | Attending: Emergency Medicine | Admitting: Emergency Medicine

## 2017-02-27 ENCOUNTER — Encounter (HOSPITAL_COMMUNITY): Payer: Self-pay | Admitting: Emergency Medicine

## 2017-02-27 DIAGNOSIS — D5700 Hb-SS disease with crisis, unspecified: Secondary | ICD-10-CM | POA: Diagnosis not present

## 2017-02-27 DIAGNOSIS — Z79899 Other long term (current) drug therapy: Secondary | ICD-10-CM | POA: Diagnosis not present

## 2017-02-27 LAB — CBC WITH DIFFERENTIAL/PLATELET
Basophils Absolute: 0 10*3/uL (ref 0.0–0.1)
Basophils Relative: 0 %
EOS ABS: 0.1 10*3/uL (ref 0.0–0.7)
Eosinophils Relative: 1 %
HEMATOCRIT: 27 % — AB (ref 36.0–46.0)
HEMOGLOBIN: 9.6 g/dL — AB (ref 12.0–15.0)
LYMPHS ABS: 4.3 10*3/uL — AB (ref 0.7–4.0)
Lymphocytes Relative: 33 %
MCH: 29.3 pg (ref 26.0–34.0)
MCHC: 35.6 g/dL (ref 30.0–36.0)
MCV: 82.3 fL (ref 78.0–100.0)
MONO ABS: 1.2 10*3/uL — AB (ref 0.1–1.0)
MONOS PCT: 10 %
NEUTROS ABS: 7.1 10*3/uL (ref 1.7–7.7)
NEUTROS PCT: 56 %
Platelets: 561 10*3/uL — ABNORMAL HIGH (ref 150–400)
RBC: 3.28 MIL/uL — ABNORMAL LOW (ref 3.87–5.11)
RDW: 14.1 % (ref 11.5–15.5)
WBC: 12.8 10*3/uL — ABNORMAL HIGH (ref 4.0–10.5)

## 2017-02-27 LAB — COMPREHENSIVE METABOLIC PANEL
ALBUMIN: 4.2 g/dL (ref 3.5–5.0)
ALK PHOS: 66 U/L (ref 38–126)
ALT: 17 U/L (ref 14–54)
AST: 28 U/L (ref 15–41)
Anion gap: 7 (ref 5–15)
BUN: 9 mg/dL (ref 6–20)
CHLORIDE: 105 mmol/L (ref 101–111)
CO2: 25 mmol/L (ref 22–32)
CREATININE: 0.54 mg/dL (ref 0.44–1.00)
Calcium: 9.1 mg/dL (ref 8.9–10.3)
GFR calc non Af Amer: >60 mL/min (ref >60)
GLUCOSE: 99 mg/dL (ref 65–99)
Potassium: 3.8 mmol/L (ref 3.5–5.1)
SODIUM: 137 mmol/L (ref 135–145)
Total Bilirubin: 0.8 mg/dL (ref 0.3–1.2)
Total Protein: 8.1 g/dL (ref 6.5–8.1)

## 2017-02-27 LAB — RETICULOCYTES
RBC.: 3.28 MIL/uL — ABNORMAL LOW (ref 3.87–5.11)
Retic Count, Absolute: 154.2 10*3/uL (ref 19.0–186.0)
Retic Ct Pct: 4.7 % — ABNORMAL HIGH (ref 0.4–3.1)

## 2017-02-27 MED ORDER — HYDROMORPHONE HCL 1 MG/ML IJ SOLN: 2.0000 mg | INTRAMUSCULAR | Status: AC

## 2017-02-27 MED ORDER — HYDROMORPHONE HCL 1 MG/ML IJ SOLN
2.0000 mg | INTRAMUSCULAR | Status: AC
Start: 2017-02-27 — End: 2017-02-27
  Administered 2017-02-27: 2 mg via INTRAVENOUS
  Filled 2017-02-27: qty 2

## 2017-02-27 MED ORDER — DIPHENHYDRAMINE HCL 25 MG PO CAPS
25.0000 mg | ORAL_CAPSULE | ORAL | Status: DC | PRN
  Administered 2017-02-27: 25 mg via ORAL
  Filled 2017-02-27: qty 1

## 2017-02-27 MED ORDER — ONDANSETRON HCL 4 MG/2ML IJ SOLN
4.0000 mg | INTRAMUSCULAR | Status: DC | PRN
  Administered 2017-02-27: 4 mg via INTRAVENOUS
  Filled 2017-02-27: qty 2

## 2017-02-27 MED ORDER — HYDROMORPHONE HCL 1 MG/ML IJ SOLN
2.0000 mg | INTRAMUSCULAR | Status: AC
Start: 2017-02-27 — End: 2017-02-27
  Administered 2017-02-27: 2 mg via INTRAVENOUS

## 2017-02-27 MED ORDER — HYDROMORPHONE HCL 1 MG/ML IJ SOLN
2.0000 mg | INTRAMUSCULAR | Status: AC
Start: 2017-02-27 — End: 2017-02-27

## 2017-02-27 MED ORDER — KETOROLAC TROMETHAMINE 30 MG/ML IJ SOLN
30.0000 mg | INTRAMUSCULAR | Status: AC
  Administered 2017-02-27: 30 mg via INTRAVENOUS
  Filled 2017-02-27: qty 1

## 2017-02-27 MED ORDER — HYDROMORPHONE HCL 1 MG/ML IJ SOLN
2.0000 mg | INTRAMUSCULAR | Status: DC
  Administered 2017-02-27: 2 mg via INTRAVENOUS
  Filled 2017-02-27 (×2): qty 2

## 2017-02-27 MED ORDER — HYDROMORPHONE HCL 1 MG/ML IJ SOLN
2.0000 mg | INTRAMUSCULAR | Status: AC
  Administered 2017-02-27: 2 mg via INTRAVENOUS
  Filled 2017-02-27: qty 2

## 2017-02-27 MED ORDER — HYDROMORPHONE HCL 1 MG/ML IJ SOLN: 2.0000 mg | INTRAMUSCULAR | Status: DC

## 2017-02-27 NOTE — ED Provider Notes (Addendum)
[facility] DEPT Provider Note   CSN: [id number] Arrival date & time: 02/27/17  0408     History   Chief Complaint Chief Complaint  Patient presents with  . Sickle Cell Pain Crisis    HPI LEZLEE GILLS is a 26 y.o. female.  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 This particular episode has been going on for "on and off 1 month". Has continued to have pain since last seen here on 4/24. Patient has been trying to escalate her oxycodone at home with no relief. Pain is recurrently in her bilateral lower legs and bilateral forearms. Denies chest pain or short of breath. Has had a cough for about one month associated with congestion and feels like it's due to allergies. No fevers. Pain is currently a 10/10. This feels similar to multiple prior sickle cell pain crises. No joint swelling or difficulty with movement of her joints in any extremity.  Past Medical History:  Diagnosis Date  . Abortion    05/2012  . Headache(784.0)   . Sickle cell crisis [facility])     Patient Active Problem List   Diagnosis Date Noted  . Sickle cell anemia ([place]) 08/15/2016  . Breast lump 08/15/2016  . Anemia of chronic disease   . 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 S/P laparoscopic cholecystectomy 11/30/2014  . Hypokalemia 11/28/2014  . Leukocytosis 11/22/2014  . Thrombocytosis ([place]) 11/22/2014    Past Surgical History:  Procedure Laterality Date  . CHOLECYSTECTOMY N/A 11/30/2014   Procedure: LAPAROSCOPIC CHOLECYSTECTOMY SINGLE SITE WITH INTRAOPERATIVE CHOLANGIOGRAM;  Surgeon: Michael Boston, MD;  Location: [facility] ORS;  Service: General;  Laterality: N/A;  . SPLENECTOMY      OB History    Gravida Para Term Preterm AB Living   1       1     SAB TAB Ectopic Multiple Live Births                   Home Medications    Prior to Admission medications   Medication Sig Start Date End Date Taking? Authorizing  Provider  ibuprofen (ADVIL,MOTRIN) 800 MG tablet Take 800 mg by mouth 3 (three) times daily as needed for headache, mild pain or moderate pain.  02/23/17  Yes Historical Provider, MD  morphine (MS CONTIN) 60 MG 12 hr tablet Take 60 mg by mouth 2 (two) times daily as needed for pain.    Yes Historical Provider, MD  oxyCODONE-acetaminophen (PERCOCET) 10-325 MG tablet Take 1 tablet by mouth every 4 (four) hours as needed for pain.   Yes Historical Provider, MD  L-glutamine (ENDARI) 5 g PACK Powder Packet Take 15 g by mouth 2 (two) times daily. Patient not taking: Reported on 02/07/2017 01/25/17   Leana Gamer, MD    Family History Family History  Problem Relation Age of Onset  . Hypertension Mother   . Sickle cell anemia Sister   . Kidney disease Sister     Lupus  . Arthritis Sister   . Sickle cell anemia Sister   . Sickle cell trait Sister   . Heart disease Maternal Aunt     CABG  . Heart disease Maternal Uncle     CABG  . Lupus Sister     Social History Social History  Substance Use Topics  . Smoking status: Never Smoker  . Smokeless tobacco: Never Used  . Alcohol use No  Allergies   Patient has no known allergies.   Review of Systems Review of Systems  Constitutional: Negative for fever.  Respiratory: Positive for cough. Negative for shortness of breath.   Cardiovascular: Negative for chest pain.  Gastrointestinal: Negative for abdominal pain.  Musculoskeletal: Positive for arthralgias. Negative for back pain and joint swelling.  All other systems reviewed and are negative.    Physical Exam Updated Vital Signs BP 115/86   Pulse 75   Temp 98.2 F (36.8 C) (Oral)   Resp 17   Ht 5\' 3"  (1.6 m)   Wt 175 lb (79.4 kg)   LMP 02/08/2017   SpO2 98%   BMI 31.00 kg/m   Physical Exam  Constitutional: She is oriented to person, place, and time. She appears well-developed and well-nourished. No distress.  HENT:  Head: Normocephalic and atraumatic.  Right Ear:  External ear normal.  Left Ear: External ear normal.  Nose: Nose normal.  Eyes: Right eye exhibits no discharge. Left eye exhibits no discharge.  Cardiovascular: Normal rate, regular rhythm and normal heart sounds.   Pulmonary/Chest: Effort normal and breath sounds normal.  Abdominal: Soft. There is no tenderness.  Musculoskeletal:  Mild nonfocal tenderness to bilateral forearms, lower legs. No significant joint swelling to elbows, wrists or knees. No erythema.   Neurological: She is alert and oriented to person, place, and time.  Skin: Skin is warm and dry. She is not diaphoretic.  Nursing note and vitals reviewed.    ED Treatments / Results  Labs (all labs ordered are listed, but only abnormal results are displayed) Labs Reviewed  CBC WITH DIFFERENTIAL/PLATELET - Abnormal; Notable for the following:       Result Value   WBC 12.8 (*)    RBC 3.28 (*)    Hemoglobin 9.6 (*)    HCT 27.0 (*)    Platelets 561 (*)    Lymphs Abs 4.3 (*)    Monocytes Absolute 1.2 (*)    All other components within normal limits  RETICULOCYTES - Abnormal; Notable for the following:    Retic Ct Pct 4.7 (*)    RBC. 3.28 (*)    All other components within normal limits  COMPREHENSIVE METABOLIC PANEL    EKG  EKG Interpretation None       Radiology No results found.  Procedures Procedures (including critical care time)  Medications Ordered in ED Medications  HYDROmorphone (DILAUDID) injection 2 mg (not administered)    Or  HYDROmorphone (DILAUDID) injection 2 mg (not administered)  HYDROmorphone (DILAUDID) injection 2 mg (not administered)    Or  HYDROmorphone (DILAUDID) injection 2 mg (not administered)  diphenhydrAMINE (BENADRYL) capsule 25-50 mg (25 mg Oral Given 02/27/17 0633)  ondansetron (ZOFRAN) injection 4 mg (4 mg Intravenous Given 02/27/17 7169)  ketorolac (TORADOL) 30 MG/ML injection 30 mg (30 mg Intravenous Given 02/27/17 0636)  HYDROmorphone (DILAUDID) injection 2 mg (2 mg  Intravenous Given 02/27/17 0647)    Or  HYDROmorphone (DILAUDID) injection 2 mg ( Subcutaneous See Alternative 02/27/17 0647)  HYDROmorphone (DILAUDID) injection 2 mg (2 mg Intravenous Given 02/27/17 0734)    Or  HYDROmorphone (DILAUDID) injection 2 mg ( Subcutaneous See Alternative 02/27/17 0734)     Initial Impression / Assessment and Plan / ED Course  I have reviewed the triage vital signs and the nursing notes.  Pertinent labs & imaging results that were available during my care of the patient were reviewed by me and considered in my medical decision making (see chart for details).  This appears to be an otherwise uncomplicated sickle cell pain crisis. Pain improving. She is awaiting her 3rd dose of narcotics, if she continues to improve, will d/c. WBC is chronically elevated, stable at this time. Highly doubt other emergent condition, f/u with PCP. Care to Dr. Ashok Cordia.  Final Clinical Impressions(s) / ED Diagnoses   Final diagnoses:  Sickle cell pain crisis [facility])    New Prescriptions New Prescriptions   No medications on file     Sherwood Gambler, MD 02/27/17 [place], MD 02/27/17 [phone]

## 2017-02-27 NOTE — ED Triage Notes (Signed)
Pt reports having sickle cell pain with generalized pain throughout body. Pt reports no recent hospitalizations.

## 2017-02-27 NOTE — Discharge Instructions (Addendum)
It was our pleasure to provide your ER care today - we hope that you feel better.  Drink adequate fluids.  Take your pain medication as need.  Follow up with your sickle cell doctor/clinic in the next 1-2 days.  Return to ER if worse, new symptoms, fevers, trouble breathing, other medical emergency.  You were given pain medication in the ER - no driving for the next 6 hours, or any time when taking opiate pain medication.

## 2017-03-13 ENCOUNTER — Encounter (HOSPITAL_COMMUNITY): Payer: Self-pay | Admitting: Emergency Medicine

## 2017-03-13 ENCOUNTER — Emergency Department (HOSPITAL_COMMUNITY)
Admission: EM | Admit: 2017-03-13 | Discharge: 2017-03-13 | Disposition: A | Discharge status: Home / Self Care | Payer: Medicaid Other | Attending: Emergency Medicine | Admitting: Emergency Medicine

## 2017-03-13 DIAGNOSIS — D5700 Hb-SS disease with crisis, unspecified: Secondary | ICD-10-CM

## 2017-03-13 DIAGNOSIS — Z79899 Other long term (current) drug therapy: Secondary | ICD-10-CM | POA: Diagnosis not present

## 2017-03-13 LAB — CBC WITH DIFFERENTIAL/PLATELET
BASOS ABS: 0 10*3/uL (ref 0.0–0.1)
BASOS PCT: 0 %
EOS PCT: 2 %
Eosinophils Absolute: 0.2 10*3/uL (ref 0.0–0.7)
HCT: 25 % — ABNORMAL LOW (ref 36.0–46.0)
Hemoglobin: 9 g/dL — ABNORMAL LOW (ref 12.0–15.0)
LYMPHS ABS: 5.6 10*3/uL — AB (ref 0.7–4.0)
Lymphocytes Relative: 56 %
MCH: 29.3 pg (ref 26.0–34.0)
MCHC: 36 g/dL (ref 30.0–36.0)
MCV: 81.4 fL (ref 78.0–100.0)
Monocytes Absolute: 1.3 10*3/uL — ABNORMAL HIGH (ref 0.1–1.0)
Monocytes Relative: 14 %
NEUTROS PCT: 28 %
Neutro Abs: 2.8 10*3/uL (ref 1.7–7.7)
PLATELETS: 683 10*3/uL — AB (ref 150–400)
RBC: 3.07 MIL/uL — AB (ref 3.87–5.11)
RDW: 14.9 % (ref 11.5–15.5)
WBC: 9.9 10*3/uL (ref 4.0–10.5)

## 2017-03-13 LAB — COMPREHENSIVE METABOLIC PANEL
ALBUMIN: 4 g/dL (ref 3.5–5.0)
ALK PHOS: 66 U/L (ref 38–126)
ALT: 20 U/L (ref 14–54)
ANION GAP: 7 (ref 5–15)
AST: 25 U/L (ref 15–41)
BILIRUBIN TOTAL: 1.2 mg/dL (ref 0.3–1.2)
BUN: 10 mg/dL (ref 6–20)
CO2: 26 mmol/L (ref 22–32)
Calcium: 9.3 mg/dL (ref 8.9–10.3)
Chloride: 106 mmol/L (ref 101–111)
Creatinine, Ser: 0.6 mg/dL (ref 0.44–1.00)
Glucose, Bld: 96 mg/dL (ref 65–99)
POTASSIUM: 3.5 mmol/L (ref 3.5–5.1)
Sodium: 139 mmol/L (ref 135–145)
TOTAL PROTEIN: 7.7 g/dL (ref 6.5–8.1)

## 2017-03-13 LAB — RETICULOCYTES
RBC.: 3.07 MIL/uL — AB (ref 3.87–5.11)
RETIC COUNT ABSOLUTE: 208.8 10*3/uL — AB (ref 19.0–186.0)
RETIC CT PCT: 6.8 % — AB (ref 0.4–3.1)

## 2017-03-13 MED ORDER — SODIUM CHLORIDE 0.45 % IV SOLN
INTRAVENOUS | Status: DC
  Administered 2017-03-13: 17:00:00 via INTRAVENOUS

## 2017-03-13 MED ORDER — HYDROMORPHONE HCL 1 MG/ML IJ SOLN: 2.0000 mg | INTRAMUSCULAR | Status: AC

## 2017-03-13 MED ORDER — KETOROLAC TROMETHAMINE 15 MG/ML IJ SOLN
15.0000 mg | INTRAMUSCULAR | Status: AC
  Administered 2017-03-13: 15 mg via INTRAVENOUS
  Filled 2017-03-13: qty 1

## 2017-03-13 MED ORDER — HYDROMORPHONE HCL 1 MG/ML IJ SOLN
2.0000 mg | INTRAMUSCULAR | Status: AC
  Administered 2017-03-13: 2 mg via INTRAVENOUS
  Filled 2017-03-13: qty 2

## 2017-03-13 MED ORDER — HYDROMORPHONE HCL 1 MG/ML IJ SOLN
2.0000 mg | Freq: Once | INTRAMUSCULAR | Status: AC
  Administered 2017-03-13: 2 mg via INTRAVENOUS
  Filled 2017-03-13: qty 2

## 2017-03-13 MED ORDER — ONDANSETRON HCL 4 MG/2ML IJ SOLN
4.0000 mg | INTRAMUSCULAR | Status: DC | PRN
  Administered 2017-03-13: 4 mg via INTRAVENOUS
  Filled 2017-03-13: qty 2

## 2017-03-13 MED ORDER — DIPHENHYDRAMINE HCL 50 MG/ML IJ SOLN
25.0000 mg | Freq: Once | INTRAMUSCULAR | Status: AC
  Administered 2017-03-13: 25 mg via INTRAVENOUS
  Filled 2017-03-13: qty 1

## 2017-03-13 NOTE — Discharge Instructions (Signed)
Please continue taking medications at home as prescribed. Follow-up with the sickle cell clinic this week. Return to the ED if he develops any worsening symptoms.

## 2017-03-13 NOTE — ED Notes (Signed)
IV team at bedside - unsuccessful

## 2017-03-13 NOTE — ED Triage Notes (Signed)
Patient c/o lower back and bilateral lower leg pain x3 days. Reports pain mimics typical sickle cell pain. Ambulatory to triage. Denies chest pain

## 2017-03-13 NOTE — ED Notes (Signed)
Manuela Schwartz at bedside for [place] IV

## 2017-03-13 NOTE — ED Provider Notes (Signed)
[place] DEPT Provider Note   CSN: [id number] Arrival date & time: 03/13/17  1403     History   Chief Complaint Chief Complaint  Patient presents with  . Sickle Cell Pain Crisis    HPI Carmen [name] is a 26 y.o. female.  HPI 26 year old African-American female past medical history significant for sickle cell anemia and sickle cell pain crisis presents to the emergency department today with sickle cell pain crisis. The patient states that this episode has been worsening over the past 2-3 days and has not been controlled by her morphine and oxycodone at home. She states the pain is currently in her bilateral lower legs and low back which is typical of her sickle cell pain. She denies any chest pain or shortness of breath. Denies any cough, fevers, lightheadedness, dizziness. Denies any lower extremity edema. Pain is currently a 10/10. This feels similar to multiple prior sickle cell pain crises. No joint swelling or difficulty with movement of her joints in any extremity. Past Medical History:  Diagnosis Date  . Abortion    05/2012  . Headache(784.0)   . Sickle cell crisis [facility])     Patient Active Problem List   Diagnosis Date Noted  . Sickle cell anemia ([place]) 08/15/2016  . Breast lump 08/15/2016  . Anemia of chronic disease   . 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 S/P laparoscopic cholecystectomy 11/30/2014  . Hypokalemia 11/28/2014  . Leukocytosis 11/22/2014  . Thrombocytosis ([place]) 11/22/2014    Past Surgical History:  Procedure Laterality Date  . CHOLECYSTECTOMY N/A 11/30/2014   Procedure: LAPAROSCOPIC CHOLECYSTECTOMY SINGLE SITE WITH INTRAOPERATIVE CHOLANGIOGRAM;  Surgeon: Michael Boston, MD;  Location: [facility] ORS;  Service: General;  Laterality: N/A;  . SPLENECTOMY      OB History    Gravida Para Term Preterm AB Living   1       1     SAB TAB Ectopic Multiple Live Births                   Home Medications    Prior to Admission  medications   Medication Sig Start Date End Date Taking? Authorizing Provider  ibuprofen (ADVIL,MOTRIN) 800 MG tablet Take 800 mg by mouth 3 (three) times daily as needed for headache, mild pain or moderate pain.  02/23/17  Yes [provider]  morphine (MS CONTIN) 60 MG 12 hr tablet Take 60 mg by mouth 2 (two) times daily as needed for pain.    Yes [provider]  oxyCODONE-acetaminophen (PERCOCET) 10-325 MG tablet Take 1 tablet by mouth every 4 (four) hours as needed for pain.   Yes [provider]  L-glutamine (ENDARI) 5 g PACK Powder Packet Take 15 g by mouth 2 (two) times daily. Patient not taking: Reported on 02/07/2017 01/25/17   Leana Gamer, MD     Family History Family History  Problem Relation Age of Onset  . Hypertension Mother   . Sickle cell anemia Sister   . Kidney disease Sister        Lupus  . Arthritis Sister   . Sickle cell anemia Sister   . Sickle cell trait Sister   . Heart disease Maternal Aunt        CABG  . Heart disease Maternal Uncle        CABG  . Lupus Sister     Social History Social History  Substance Use Topics  . Smoking status: Never Smoker  .  Smokeless tobacco: Never Used  . Alcohol use No     Allergies   Patient has no known allergies.   Review of Systems Review of Systems  Constitutional: Negative for chills and fever.  Respiratory: Negative for cough, shortness of breath and wheezing.   Cardiovascular: Negative for chest pain.  Gastrointestinal: Negative for abdominal pain, nausea and vomiting.  Genitourinary: Negative for dysuria, flank pain, frequency and urgency.  Musculoskeletal: Positive for back pain and myalgias.  Neurological: Negative for dizziness, syncope, weakness, light-headedness, numbness and headaches.     Physical Exam Updated Vital Signs BP 103/71   Pulse 69   Temp 98 F (36.7 C) (Oral)   Resp 18   LMP 03/13/2017   SpO2 96%   Physical Exam  Constitutional: She appears  well-developed and well-nourished. No distress.  HENT:  Head: Normocephalic and atraumatic.  Mouth/Throat: Oropharynx is clear and moist.  Eyes: Conjunctivae are normal. Right eye exhibits no discharge. Left eye exhibits no discharge. No scleral icterus.  Neck: Normal range of motion. Neck supple. No thyromegaly present.  Cardiovascular: Normal rate, regular rhythm, normal heart sounds and intact distal pulses.  Exam reveals no gallop and no friction rub.   No murmur heard. Pulmonary/Chest: Effort normal and breath sounds normal. No respiratory distress. She has no wheezes. She has no rales. She exhibits no tenderness.  Abdominal: Soft. Bowel sounds are normal. She exhibits no distension. There is no tenderness. There is no rebound and no guarding.  Musculoskeletal: Normal range of motion.  Mild nonfocal tenderness to bilateral forearms, lower legs. No significant joint swelling to elbows, wrists or knees. No erythema.    Lymphadenopathy:    She has no cervical adenopathy.  Neurological: She is alert.  Skin: Skin is warm and dry. Capillary refill takes less than 2 seconds.  Nursing note and vitals reviewed.    ED Treatments / Results  Labs (all labs ordered are listed, but only abnormal results are displayed) Labs Reviewed  CBC WITH DIFFERENTIAL/PLATELET - Abnormal; Notable for the following:       Result Value   RBC 3.07 (*)    Hemoglobin 9.0 (*)    HCT 25.0 (*)    Platelets 683 (*)    Lymphs Abs 5.6 (*)    Monocytes Absolute 1.3 (*)    All other components within normal limits  RETICULOCYTES - Abnormal; Notable for the following:    Retic Ct Pct 6.8 (*)    RBC. 3.07 (*)    Retic Count, Manual 208.8 (*)    All other components within normal limits  COMPREHENSIVE METABOLIC PANEL    EKG  EKG Interpretation None       Radiology No results found.  Procedures Procedures (including critical care time)  Medications Ordered in ED Medications  0.45 % sodium chloride  infusion ( Intravenous Stopped 03/13/17 1948)  ondansetron (ZOFRAN) injection 4 mg (4 mg Intravenous Given 03/13/17 1626)  ketorolac (TORADOL) 15 MG/ML injection 15 mg (15 mg Intravenous Given 03/13/17 1626)  HYDROmorphone (DILAUDID) injection 2 mg (2 mg Intravenous Given 03/13/17 1626)    Or  HYDROmorphone (DILAUDID) injection 2 mg ( Subcutaneous See Alternative 03/13/17 1626)  HYDROmorphone (DILAUDID) injection 2 mg (2 mg Intravenous Given 03/13/17 1706)    Or  HYDROmorphone (DILAUDID) injection 2 mg ( Subcutaneous See Alternative 03/13/17 1706)  HYDROmorphone (DILAUDID) injection 2 mg (2 mg Intravenous Given 03/13/17 1733)    Or  HYDROmorphone (DILAUDID) injection 2 mg ( Subcutaneous See Alternative 03/13/17 1733)  diphenhydrAMINE (BENADRYL) injection 25 mg (25 mg Intravenous Given 03/13/17 1626)  HYDROmorphone (DILAUDID) injection 2 mg (2 mg Intravenous Given 03/13/17 1901)     Initial Impression / Assessment and Plan / ED Course  I have reviewed the triage vital signs and the nursing notes.  Pertinent labs & imaging results that were available during my care of the patient were reviewed by me and considered in my medical decision making (see chart for details).     Patient presents to the emergency department with typical sickle cell pain crisis. She appeared otherwise uncomplicated. Denies any fever, chest pain, shortness of breath, cough. Doubt acute chest syndrome. Pain is been improving with medications. Pt requesting 4th dose of pain medication and discharge and asked to look in her prior records that she always gets 4 doses of pain medication. States that she is ready to be discharged has to go to work. No leukocytosis is noted. Hgb stable at baseline. Patient does have elevated reticulocyte count consistent with sickle cell pain crisis. All other labs unremarkable. Patient has elevated stable thrombocytosis. Doubt any other acute emergent intervention is indicated at this time. Encouraged  follow-up with her PCP. Pt is hemodynamically stable, in NAD, & able to ambulate in the ED. Pain has been managed & has no complaints prior to dc. Pt is comfortable with above plan and is stable for discharge at this time. All questions were answered prior to disposition. Strict return precautions for f/u to the ED were discussed.   Final Clinical Impressions(s) / ED Diagnoses   Final diagnoses:  Sickle cell pain crisis [facility])    New Prescriptions Discharge Medication List as of 03/13/2017  7:40 PM       Doristine Devoid, PA-C 03/13/17 2211    Tanna Furry, MD 03/18/17 [phone]

## 2017-03-13 NOTE — ED Notes (Signed)
2 attempts unsuccessful of IV

## 2017-03-18 ENCOUNTER — Encounter (HOSPITAL_COMMUNITY): Payer: Self-pay | Admitting: Nurse Practitioner

## 2017-03-18 ENCOUNTER — Observation Stay (HOSPITAL_COMMUNITY)
Admission: EM | Admit: 2017-03-18 | Discharge: 2017-03-19 | Discharge status: Against Medical Advice | Payer: Medicaid Other | Attending: Internal Medicine | Admitting: Internal Medicine

## 2017-03-18 DIAGNOSIS — Z9049 Acquired absence of other specified parts of digestive tract: Secondary | ICD-10-CM | POA: Diagnosis not present

## 2017-03-18 DIAGNOSIS — Z832 Family history of diseases of the blood and blood-forming organs and certain disorders involving the immune mechanism: Secondary | ICD-10-CM | POA: Diagnosis not present

## 2017-03-18 DIAGNOSIS — D638 Anemia in other chronic diseases classified elsewhere: Secondary | ICD-10-CM | POA: Diagnosis not present

## 2017-03-18 DIAGNOSIS — Z8249 Family history of ischemic heart disease and other diseases of the circulatory system: Secondary | ICD-10-CM | POA: Diagnosis not present

## 2017-03-18 DIAGNOSIS — Z9081 Acquired absence of spleen: Secondary | ICD-10-CM | POA: Diagnosis not present

## 2017-03-18 DIAGNOSIS — Z79891 Long term (current) use of opiate analgesic: Secondary | ICD-10-CM | POA: Insufficient documentation

## 2017-03-18 DIAGNOSIS — Z841 Family history of disorders of kidney and ureter: Secondary | ICD-10-CM | POA: Diagnosis not present

## 2017-03-18 DIAGNOSIS — Z8269 Family history of other diseases of the musculoskeletal system and connective tissue: Secondary | ICD-10-CM | POA: Insufficient documentation

## 2017-03-18 DIAGNOSIS — D5700 Hb-SS disease with crisis, unspecified: Secondary | ICD-10-CM | POA: Diagnosis not present

## 2017-03-18 DIAGNOSIS — Z79899 Other long term (current) drug therapy: Secondary | ICD-10-CM | POA: Insufficient documentation

## 2017-03-18 DIAGNOSIS — D571 Sickle-cell disease without crisis: Secondary | ICD-10-CM | POA: Diagnosis present

## 2017-03-18 DIAGNOSIS — N63 Unspecified lump in unspecified breast: Secondary | ICD-10-CM | POA: Insufficient documentation

## 2017-03-18 DIAGNOSIS — Z8261 Family history of arthritis: Secondary | ICD-10-CM | POA: Diagnosis not present

## 2017-03-18 LAB — CBC WITH DIFFERENTIAL/PLATELET
Basophils Absolute: 0 10*3/uL (ref 0.0–0.1)
Basophils Relative: 0 %
EOS ABS: 0.2 10*3/uL (ref 0.0–0.7)
EOS PCT: 2 %
HCT: 27.9 % — ABNORMAL LOW (ref 36.0–46.0)
Hemoglobin: 9.7 g/dL — ABNORMAL LOW (ref 12.0–15.0)
Lymphocytes Relative: 55 %
Lymphs Abs: 5.8 10*3/uL — ABNORMAL HIGH (ref 0.7–4.0)
MCH: 28.5 pg (ref 26.0–34.0)
MCHC: 34.8 g/dL (ref 30.0–36.0)
MCV: 82.1 fL (ref 78.0–100.0)
MONO ABS: 1 10*3/uL (ref 0.1–1.0)
MONOS PCT: 9 %
Neutro Abs: 3.6 10*3/uL (ref 1.7–7.7)
Neutrophils Relative %: 34 %
PLATELETS: 619 10*3/uL — AB (ref 150–400)
RBC: 3.4 MIL/uL — ABNORMAL LOW (ref 3.87–5.11)
RDW: 16.2 % — AB (ref 11.5–15.5)
WBC: 10.6 10*3/uL — AB (ref 4.0–10.5)

## 2017-03-18 LAB — COMPREHENSIVE METABOLIC PANEL
ALT: 15 U/L (ref 14–54)
AST: 22 U/L (ref 15–41)
Albumin: 4 g/dL (ref 3.5–5.0)
Alkaline Phosphatase: 71 U/L (ref 38–126)
Anion gap: 8 (ref 5–15)
BUN: 11 mg/dL (ref 6–20)
CHLORIDE: 109 mmol/L (ref 101–111)
CO2: 25 mmol/L (ref 22–32)
CREATININE: 0.56 mg/dL (ref 0.44–1.00)
Calcium: 9.3 mg/dL (ref 8.9–10.3)
GFR calc Af Amer: >60 mL/min (ref >60)
Glucose, Bld: 91 mg/dL (ref 65–99)
Potassium: 3.7 mmol/L (ref 3.5–5.1)
Sodium: 142 mmol/L (ref 135–145)
Total Bilirubin: 1 mg/dL (ref 0.3–1.2)
Total Protein: 7.6 g/dL (ref 6.5–8.1)

## 2017-03-18 LAB — RETICULOCYTES
RBC.: 3.4 MIL/uL — AB (ref 3.87–5.11)
Retic Count, Absolute: 326.4 10*3/uL — ABNORMAL HIGH (ref 19.0–186.0)
Retic Ct Pct: 9.6 % — ABNORMAL HIGH (ref 0.4–3.1)

## 2017-03-18 MED ORDER — HYDROMORPHONE HCL 1 MG/ML IJ SOLN
2.0000 mg | INTRAMUSCULAR | Status: AC
  Administered 2017-03-19: 2 mg via INTRAVENOUS
  Filled 2017-03-18: qty 2

## 2017-03-18 MED ORDER — DEXTROSE-NACL 5-0.45 % IV SOLN
INTRAVENOUS | Status: DC
  Administered 2017-03-18 – 2017-03-19 (×2): via INTRAVENOUS

## 2017-03-18 MED ORDER — HYDROMORPHONE HCL 1 MG/ML IJ SOLN: 2.0000 mg | INTRAMUSCULAR | Status: AC

## 2017-03-18 MED ORDER — DIPHENHYDRAMINE HCL 50 MG/ML IJ SOLN
25.0000 mg | Freq: Once | INTRAMUSCULAR | Status: AC
  Administered 2017-03-18: 25 mg via INTRAVENOUS
  Filled 2017-03-18: qty 1

## 2017-03-18 MED ORDER — KETOROLAC TROMETHAMINE 15 MG/ML IJ SOLN
15.0000 mg | INTRAMUSCULAR | Status: AC
  Administered 2017-03-18: 15 mg via INTRAVENOUS
  Filled 2017-03-18: qty 1

## 2017-03-18 MED ORDER — HYDROMORPHONE HCL 1 MG/ML IJ SOLN
2.0000 mg | INTRAMUSCULAR | Status: AC
  Administered 2017-03-18: 2 mg via INTRAVENOUS
  Filled 2017-03-18: qty 2

## 2017-03-18 MED ORDER — HYDROMORPHONE HCL 1 MG/ML IJ SOLN
2.0000 mg | INTRAMUSCULAR | Status: DC
  Administered 2017-03-19: 2 mg via INTRAVENOUS
  Filled 2017-03-18 (×2): qty 2

## 2017-03-18 MED ORDER — ONDANSETRON HCL 4 MG/2ML IJ SOLN: 4.0000 mg | INTRAMUSCULAR | Status: DC | PRN

## 2017-03-18 MED ORDER — HYDROMORPHONE HCL 1 MG/ML IJ SOLN: 2.0000 mg | INTRAMUSCULAR | Status: DC

## 2017-03-18 NOTE — ED Triage Notes (Signed)
Pt is c/o lower back pain and LE pain that she is relating to sickle cell crisis. States prescribed home medication did not help.

## 2017-03-18 NOTE — ED Notes (Signed)
IV team unable to obtain IV access or blood.  Terri, CN will attempt.

## 2017-03-18 NOTE — ED Provider Notes (Signed)
[place] DEPT Provider Note   CSN: [id number] Arrival date & time: 03/18/17  8338     History   Chief Complaint Chief Complaint  Patient presents with  . Sickle Cell Pain Crisis    HPI Carmen [name] is a 26 y.o. female.  The history is provided by the patient and medical records.  Sickle Cell Pain Crisis  Location:  Lower extremity and back Severity:  Severe Onset quality:  Gradual Duration:  2 days Similar to previous crisis episodes: yes   Timing:  Constant Progression:  Worsening Chronicity:  Recurrent History of pulmonary emboli: no   Context: not infection   Relieved by:  Nothing Worsened by:  Nothing Associated symptoms: no chest pain, no congestion, no cough, no fatigue, no fever, no headaches, no nausea, no shortness of breath, no vomiting and no wheezing   Risk factors: frequent pain crises     Past Medical History:  Diagnosis Date  . Abortion    05/2012  . Headache(784.0)   . Sickle cell crisis [facility])     Patient Active Problem List   Diagnosis Date Noted  . Sickle cell anemia ([place]) 08/15/2016  . Breast lump 08/15/2016  . Anemia of chronic disease   . 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 S/P laparoscopic cholecystectomy 11/30/2014  . Hypokalemia 11/28/2014  . Leukocytosis 11/22/2014  . Thrombocytosis ([place]) 11/22/2014    Past Surgical History:  Procedure Laterality Date  . CHOLECYSTECTOMY N/A 11/30/2014   Procedure: LAPAROSCOPIC CHOLECYSTECTOMY SINGLE SITE WITH INTRAOPERATIVE CHOLANGIOGRAM;  Surgeon: Michael Boston, MD;  Location: [facility] ORS;  Service: General;  Laterality: N/A;  . SPLENECTOMY      OB History    Gravida Para Term Preterm AB Living   1       1     SAB TAB Ectopic Multiple Live Births                   Home Medications    Prior to Admission medications   Medication Sig Start Date End Date Taking? Authorizing Provider  ibuprofen (ADVIL,MOTRIN) 800 MG tablet Take 800 mg by mouth 3 (three)  times daily as needed for headache, mild pain or moderate pain.  02/23/17  Yes [provider]  morphine (MS CONTIN) 60 MG 12 hr tablet Take 60 mg by mouth 2 (two) times daily as needed for pain.    Yes [provider]  oxyCODONE-acetaminophen (PERCOCET) 10-325 MG tablet Take 1 tablet by mouth every 4 (four) hours as needed for pain.   Yes [provider]  L-glutamine (ENDARI) 5 g PACK Powder Packet Take 15 g by mouth 2 (two) times daily. Patient not taking: Reported on 02/07/2017 01/25/17   Leana Gamer, MD    Family History Family History  Problem Relation Age of Onset  . Hypertension Mother   . Sickle cell anemia Sister   . Kidney disease Sister        Lupus  . Arthritis Sister   . Sickle cell anemia Sister   . Sickle cell trait Sister   . Heart disease Maternal Aunt        CABG  . Heart disease Maternal Uncle        CABG  . Lupus Sister     Social History Social History  Substance Use Topics  . Smoking status: Never Smoker  . Smokeless tobacco: Never Used  . Alcohol use No     Allergies  Patient has no known allergies.   Review of Systems Review of Systems  Constitutional: Negative for activity change, appetite change, chills, diaphoresis, fatigue and fever.  HENT: Negative for congestion and rhinorrhea.   Eyes: Negative for visual disturbance.  Respiratory: Negative for cough, chest tightness, shortness of breath, wheezing and stridor.   Cardiovascular: Negative for chest pain, palpitations and leg swelling.  Gastrointestinal: Negative for abdominal distention, abdominal pain, constipation, diarrhea, nausea and vomiting.  Genitourinary: Negative for difficulty urinating, dysuria, flank pain, frequency, hematuria, menstrual problem, pelvic pain, vaginal bleeding and vaginal discharge.  Musculoskeletal: Negative for back pain and neck pain.  Skin: Negative for rash and wound.  Neurological: Negative for dizziness, weakness,  light-headedness, numbness and headaches.  Psychiatric/Behavioral: Negative for agitation and confusion.  All other systems reviewed and are negative.    Physical Exam Updated Vital Signs BP 118/74 (BP Location: Left Arm)   Pulse 74   Temp 97.5 F (36.4 C) (Oral)   Resp 14   LMP 03/13/2017   SpO2 98%   Physical Exam  Constitutional: She is oriented to person, place, and time. She appears well-developed and well-nourished. No distress.  HENT:  Head: Normocephalic and atraumatic.  Nose: Nose normal.  Mouth/Throat: Oropharynx is clear and moist. No oropharyngeal exudate.  Eyes: Conjunctivae and EOM are normal. Pupils are equal, round, and reactive to light.  Neck: Normal range of motion. Neck supple.  Cardiovascular: Normal rate, regular rhythm and intact distal pulses.   No murmur heard. Pulmonary/Chest: Effort normal and breath sounds normal. No stridor. No respiratory distress. She has no wheezes. She exhibits no tenderness.  Abdominal: Soft. There is no tenderness.  Musculoskeletal: She exhibits no edema or tenderness.  Neurological: She is alert and oriented to person, place, and time. She is not disoriented. No cranial nerve deficit or sensory deficit. She exhibits normal muscle tone. Coordination normal. GCS eye subscore is 4. GCS verbal subscore is 5. GCS motor subscore is 6.  Skin: Skin is warm and dry. Capillary refill takes less than 2 seconds. No rash noted. No erythema.  Psychiatric: She has a normal mood and affect.  Nursing note and vitals reviewed.    ED Treatments / Results  Labs (all labs ordered are listed, but only abnormal results are displayed) Labs Reviewed  CBC WITH DIFFERENTIAL/PLATELET - Abnormal; Notable for the following:       Result Value   WBC 10.6 (*)    RBC 3.40 (*)    Hemoglobin 9.7 (*)    HCT 27.9 (*)    RDW 16.2 (*)    Platelets 619 (*)    Lymphs Abs 5.8 (*)    All other components within normal limits  RETICULOCYTES - Abnormal;  Notable for the following:    Retic Ct Pct 9.6 (*)    RBC. 3.40 (*)    Retic Count, Manual 326.4 (*)    All other components within normal limits  COMPREHENSIVE METABOLIC PANEL  PREGNANCY, URINE  POC URINE PREG, ED    EKG  EKG Interpretation None       Radiology No results found.  Procedures Procedures (including critical care time)  Medications Ordered in ED Medications  dextrose 5 %-0.45 % sodium chloride infusion ( Intravenous New Bag/Given 03/18/17 2332)  HYDROmorphone (DILAUDID) injection 2 mg (not administered)    Or  HYDROmorphone (DILAUDID) injection 2 mg (not administered)  ondansetron (ZOFRAN) injection 4 mg (not administered)  ketorolac (TORADOL) 15 MG/ML injection 15 mg (15 mg Intravenous Given 03/18/17  2332)  HYDROmorphone (DILAUDID) injection 2 mg (2 mg Intravenous Given 03/18/17 2331)    Or  HYDROmorphone (DILAUDID) injection 2 mg ( Subcutaneous See Alternative 03/18/17 2331)  HYDROmorphone (DILAUDID) injection 2 mg (2 mg Intravenous Given 03/19/17 0004)    Or  HYDROmorphone (DILAUDID) injection 2 mg ( Subcutaneous See Alternative 03/19/17 0004)  HYDROmorphone (DILAUDID) injection 2 mg (2 mg Intravenous Given 03/19/17 0108)    Or  HYDROmorphone (DILAUDID) injection 2 mg ( Subcutaneous See Alternative 03/19/17 0108)  diphenhydrAMINE (BENADRYL) injection 25 mg (25 mg Intravenous Given 03/18/17 2331)     Initial Impression / Assessment and Plan / ED Course  I have reviewed the triage vital signs and the nursing notes.  Pertinent labs & imaging results that were available during my care of the patient were reviewed by me and considered in my medical decision making (see chart for details).     CHAUNDRA ABREU is a 26 y.o. female with a past medical history significant for sickle cell disease who presents with pain. Patient says that her symptoms are similar to prior pain crisis. She says that for the last several days, she has had pain in her bilateral legs and  back. She says this feels similar to prior. She describes it as a 10 out of 10 severity and not being managed by home pain medications. She reports no fevers, chills, chest pain, shortness of breath, nausea, vomiting, constipation, diarrhea, dysuria. She described her pain as 10 out of 10 in her extremities and back. She says that she has had no sick contacts. She denies other complaints today.  History and exam are seen above. On exam, lungs are clear. Chest nontender. Abdomen is soft and nontender. No CVA tenderness. Patient has no focal neurologic deficits.  Based on patient's complaint, suspect sickle cell pain crisis. Laboratory testing was performed and appeared similar to prior.  Patient received 2 doses of the IV pain medications prior to reassessment. Patient taking 2 to report 10 out of 10 pain in her extremity. He is concerned about her discomfort.  Patient given a third dose of pain medication and subsequently reassessed.   Patient received a third dose of pain medication with continued severe pain.   Patient says her pain is uncontrolled and too severe for discharge.  Hospitalist team called for admission.   Final Clinical Impressions(s) / ED Diagnoses   Final diagnoses:  Sickle cell pain crisis (HCC)    Clinical Impression: 1. Sickle cell pain crisis [facility])     Disposition: Admit to Hospitalist service    Ezell Melikian, Gwenyth Allegra, MD 03/19/17 1032

## 2017-03-18 NOTE — ED Notes (Signed)
Upon initial assessment pt is lethargic, eyes closed, sleeping in between questions by this Probation officer.

## 2017-03-19 DIAGNOSIS — D5700 Hb-SS disease with crisis, unspecified: Secondary | ICD-10-CM | POA: Diagnosis present

## 2017-03-19 LAB — POC URINE PREG, ED: Preg Test, Ur: NEGATIVE

## 2017-03-19 MED ORDER — HYDROMORPHONE 1 MG/ML IV SOLN
INTRAVENOUS | Status: DC
  Filled 2017-03-19: qty 25

## 2017-03-19 MED ORDER — SODIUM CHLORIDE 0.9% FLUSH: 9.0000 mL | INTRAVENOUS | Status: DC | PRN

## 2017-03-19 MED ORDER — SENNOSIDES-DOCUSATE SODIUM 8.6-50 MG PO TABS
1.0000 | ORAL_TABLET | Freq: Two times a day (BID) | ORAL | Status: DC
  Administered 2017-03-19: 1 via ORAL
  Filled 2017-03-19: qty 1

## 2017-03-19 MED ORDER — POLYETHYLENE GLYCOL 3350 17 G PO PACK: 17.0000 g | PACK | Freq: Every day | ORAL | Status: DC | PRN

## 2017-03-19 MED ORDER — MORPHINE SULFATE ER 30 MG PO TBCR: 60.0000 mg | EXTENDED_RELEASE_TABLET | Freq: Two times a day (BID) | ORAL | Status: DC

## 2017-03-19 MED ORDER — HYDROMORPHONE HCL 1 MG/ML IJ SOLN
0.5000 mg | Freq: Once | INTRAMUSCULAR | Status: AC
  Administered 2017-03-19: 0.5 mg via INTRAVENOUS

## 2017-03-19 MED ORDER — SODIUM CHLORIDE 0.9 % IV SOLN
25.0000 mg | INTRAVENOUS | Status: DC | PRN
  Filled 2017-03-19: qty 0.5

## 2017-03-19 MED ORDER — KETOROLAC TROMETHAMINE 15 MG/ML IJ SOLN: 15.0000 mg | Freq: Four times a day (QID) | INTRAMUSCULAR | Status: DC

## 2017-03-19 MED ORDER — DIPHENHYDRAMINE HCL 25 MG PO CAPS: 25.0000 mg | ORAL_CAPSULE | ORAL | Status: DC | PRN

## 2017-03-19 MED ORDER — HYDROMORPHONE 1 MG/ML IV SOLN
INTRAVENOUS | Status: DC
  Administered 2017-03-19: 4 mg via INTRAVENOUS
  Administered 2017-03-19: 25 mg via INTRAVENOUS
  Filled 2017-03-19: qty 25

## 2017-03-19 MED ORDER — KETOROLAC TROMETHAMINE 30 MG/ML IJ SOLN
30.0000 mg | Freq: Once | INTRAMUSCULAR | Status: AC
  Administered 2017-03-19: 30 mg via INTRAVENOUS
  Filled 2017-03-19: qty 1

## 2017-03-19 MED ORDER — NALOXONE HCL 0.4 MG/ML IJ SOLN: 0.4000 mg | INTRAMUSCULAR | Status: DC | PRN

## 2017-03-19 MED ORDER — KETOROLAC TROMETHAMINE 15 MG/ML IJ SOLN: 30.0000 mg | Freq: Four times a day (QID) | INTRAMUSCULAR | Status: DC

## 2017-03-19 NOTE — Progress Notes (Signed)
Patient has been threatening to leave since start of shift. After several attempts at counseling patient to stay, she is refusing and demands to leave AMA. AMA paper signed by patient and given explanation of repercussions. MD made aware.

## 2017-03-19 NOTE — H&P (Signed)
History and Physical    Carmen [name]:[id number] DOB: 10/17/1991 DOA: 03/18/2017  PCP: [name], MD   Patient coming from: Home  Chief Complaint: Pain in her legs and back.  HPI: Carmen [name] is a 26 y.o. woman with history of Sickle cell disease who presents to the ED for evaluation of increased pain in her back and legs that she feels is consistent with sickle cell crisis.  She reports a pain level 10 out 10.   She denies chest pain or shortness of breath.  No headache.  No light-headedness or LOC.  No nausea or vomiting.  No abdominal pain or diarrhea.    ED Course: Hemoglobin 9.7, which appears to be her baseline.  Urine pregnancy test negative.  She has received 8mg  of IV dilaudid in the ED.  She says that toradol only partially controls her pain.  Hospitalist asked to admit.  Review of Systems: As per HPI otherwise 10 systems reviewed and negative.   Past Medical History:  Diagnosis Date  . Abortion    05/2012  . Headache(784.0)   . Sickle cell crisis [facility])     Past Surgical History:  Procedure Laterality Date  . CHOLECYSTECTOMY N/A 11/30/2014   Procedure: LAPAROSCOPIC CHOLECYSTECTOMY SINGLE SITE WITH INTRAOPERATIVE CHOLANGIOGRAM;  Surgeon: Michael Boston, MD;  Location: [facility] ORS;  Service: General;  Laterality: N/A;  . SPLENECTOMY       reports that she has never smoked. She has never used smokeless tobacco. She reports that she does not drink alcohol or use drugs.  She is sexually active.  She tells me that she is not on any form of birth control.  No Known Allergies  Family History  Problem Relation Age of Onset  . Hypertension Mother   . Sickle cell anemia Sister   . Kidney disease Sister        Lupus  . Arthritis Sister   . Sickle cell anemia Sister   . Sickle cell trait Sister   . Heart disease Maternal Aunt        CABG  . Heart disease Maternal Uncle        CABG  . Lupus Sister      Prior to Admission medications   Medication Sig Start  Date End Date Taking? Authorizing Provider  ibuprofen (ADVIL,MOTRIN) 800 MG tablet Take 800 mg by mouth 3 (three) times daily as needed for headache, mild pain or moderate pain.  02/23/17  Yes [provider]  morphine (MS CONTIN) 60 MG 12 hr tablet Take 60 mg by mouth 2 (two) times daily as needed for pain.    Yes [provider]  oxyCODONE-acetaminophen (PERCOCET) 10-325 MG tablet Take 1 tablet by mouth every 4 (four) hours as needed for pain.   Yes [provider]    Physical Exam: Vitals:   03/19/17 0245 03/19/17 0300 03/19/17 0330 03/19/17 0400  BP:  105/75 109/72 105/77  Pulse: 68 63 (!) 52 (!) 57  Resp: 18 18 15 17   Temp:      TempSrc:      SpO2: 99% 100% 97% 100%      Constitutional: NAD, calm, comfortable, sleeping when I enter the room Vitals:   03/19/17 0245 03/19/17 0300 03/19/17 0330 03/19/17 0400  BP:  105/75 109/72 105/77  Pulse: 68 63 (!) 52 (!) 57  Resp: 18 18 15 17   Temp:      TempSrc:      SpO2: 99% 100% 97% 100%  Eyes: PERRL, lids and conjunctivae normal ENMT: Mucous membranes are moist. Posterior pharynx clear of any exudate or lesions. Normal dentition.  Neck: normal appearance, supple, no masses Respiratory: clear to auscultation bilaterally, no wheezing, no crackles. Normal respiratory effort. No accessory muscle use.  Cardiovascular: Normal rate, regular rhythm, no murmurs / rubs / gallops. No extremity edema. 2+ pedal pulses. GI: abdomen is soft and compressible.  No distention.  No tenderness.  Bowel sounds are present. Musculoskeletal:  No joint deformity in upper and lower extremities. Good ROM, no contractures. Normal muscle tone.  Skin: no rashes, warm and dry Psychiatric: Normal judgment and insight. Alert and oriented x 3. Normal mood.     Labs on Admission: I have personally reviewed following labs and imaging studies  CBC:  Recent Labs Lab 03/13/17 1627 03/18/17 2304  WBC 9.9 10.6*  NEUTROABS 2.8 3.6  HGB  9.0* 9.7*  HCT 25.0* 27.9*  MCV 81.4 82.1  PLT 683* 572*   Basic Metabolic Panel:  Recent Labs Lab 03/13/17 1627 03/18/17 2304  NA 139 142  K 3.5 3.7  CL 106 109  CO2 26 25  GLUCOSE 96 91  BUN 10 11  CREATININE 0.60 0.56  CALCIUM 9.3 9.3   GFR: CrCl cannot be calculated (Unknown ideal weight.). Liver Function Tests:  Recent Labs Lab 03/13/17 1627 03/18/17 2304  AST 25 22  ALT 20 15  ALKPHOS 66 71  BILITOT 1.2 1.0  PROT 7.7 7.6  ALBUMIN 4.0 4.0   Anemia Panel:  Recent Labs  03/18/17 2304  RETICCTPCT 9.6*     Assessment/Plan Principal Problem:   Sickle cell pain crisis (HCC) Active Problems:   Sickle cell anemia (HCC)      Sickle cell pain crisis --Dilaudid PCA --Toradol IV q6h --D5 1/2 NS at 125cc/hr --Benadryl and zofran prn --Monitor for respiratory suppression --Telemetry, continuous pulse oximetry   DVT prophylaxis: Low risk, outpatient status Code Status: FULL Family Communication: Patient alone in the ED at time of admission. Disposition Plan: Expect she will go home at discharge. Consults called: NONE Admission status: Place in observation with telemetry monitoring.   TIME SPENT: 50 minutes   Eber Jones MD [facility] Pager [phone]  If 7PM-7AM, please contact night-coverage [email or web address] Password [facility]  03/19/2017, 4:57 AM

## 2017-03-19 NOTE — Progress Notes (Signed)
101mL hydromorphone PCA wasted in sink with Gar Ponto RN.

## 2017-03-19 NOTE — Progress Notes (Signed)
Patient ID: Carmen [name], female   DOB: 1990-11-03, 26 y.o.   MRN: [id number] Subjective: Patient admitted yesterday for sickle cell pain crisis. When seen this morning, she said "I am ready to go home". When probed further, she indicated her pain is at 5/10 as against her goal of 3/10, but she wants to leave because her "boyfriend" is not helping and she just want to be alone by herself. She denies any fever, no chest pain, no SOB.  Objective:  Vital signs in last 24 hours:  Vitals:   03/19/17 0430 03/19/17 0525 03/19/17 0558 03/19/17 0945  BP: 125/77 124/86    Pulse: 60 (!) 57    Resp: 15 16 18 15   Temp:  98.6 F (37 C)    TempSrc:  Oral    SpO2: 100% 100% 100% 100%  Weight:  82.1 kg (181 lb)    Height:  5\' 4"  (1.626 m)      Intake/Output from previous day:   Intake/Output Summary (Last 24 hours) at 03/19/17 1035 Last data filed at 03/19/17 0657  Gross per 24 hour  Intake          1072.09 ml  Output                0 ml  Net          1072.09 ml   Physical Exam: General: Alert, awake, oriented x3, in no acute distress.  HEENT: [place]/AT PEERL, EOMI Neck: Trachea midline,  no masses, no thyromegal,y no JVD, no carotid bruit OROPHARYNX:  Moist, No exudate/ erythema/lesions.  Heart: Regular rate and rhythm, without murmurs, rubs, gallops, PMI non-displaced, no heaves or thrills on palpation.  Lungs: Clear to auscultation, no wheezing or rhonchi noted. No increased vocal fremitus resonant to percussion  Abdomen: Soft, nontender, nondistended, positive bowel sounds, no masses no hepatosplenomegaly noted..  Neuro: No focal neurological deficits noted cranial nerves II through XII grossly intact. DTRs 2+ bilaterally upper and lower extremities. Strength 5 out of 5 in bilateral upper and lower extremities. Musculoskeletal: No warm swelling or erythema around joints, no spinal tenderness noted. Psychiatric: Patient alert and oriented x3, good insight and cognition, good recent to remote  recall. Lymph node survey: No cervical axillary or inguinal lymphadenopathy noted.  Lab Results:  Basic Metabolic Panel:    Component Value Date/Time   NA 142 03/18/2017 2304   K 3.7 03/18/2017 2304   CL 109 03/18/2017 2304   CO2 25 03/18/2017 2304   BUN 11 03/18/2017 2304   CREATININE 0.56 03/18/2017 2304   GLUCOSE 91 03/18/2017 2304   CALCIUM 9.3 03/18/2017 2304   CBC:    Component Value Date/Time   WBC 10.6 (H) 03/18/2017 2304   HGB 9.7 (L) 03/18/2017 2304   HCT 27.9 (L) 03/18/2017 2304   PLT 619 (H) 03/18/2017 2304   MCV 82.1 03/18/2017 2304   NEUTROABS 3.6 03/18/2017 2304   LYMPHSABS 5.8 (H) 03/18/2017 2304   MONOABS 1.0 03/18/2017 2304   EOSABS 0.2 03/18/2017 2304   BASOSABS 0.0 03/18/2017 2304    No results found for this or any previous visit (from the past 240 hour(s)).  Studies/Results: No results found.  Medications: Scheduled Meds: . HYDROmorphone   Intravenous Q4H  . ketorolac  30 mg Intravenous Q6H  . senna-docusate  1 tablet Oral BID   Continuous Infusions: . dextrose 5 % and 0.45% NaCl 125 mL/hr at 03/19/17 0541  . diphenhydrAMINE (BENADRYL) IVPB(SICKLE CELL ONLY)     Consultants:  None  Procedures:  None  Antibiotics:  None  Assessment/Plan: Principal Problem:   Sickle cell pain crisis ([place]) Active Problems:   Sickle cell anemia (HCC)   1. Hb SS with crisis: Continue IVF, Toradol and Dilaudid PCA (dose adjusted) 2. Anemia: Hb at baseline 3. Chronic pain: Restart home MS Contin  Code Status: Full Code Family Communication: N/A Disposition Plan: Not yet ready for discharge  Jasn Xia  If 7PM-7AM, please contact night-coverage.  03/19/2017, 10:35 AM  LOS: 0 days

## 2017-03-20 ENCOUNTER — Emergency Department (HOSPITAL_COMMUNITY)
Admission: EM | Admit: 2017-03-20 | Discharge: 2017-03-20 | Disposition: A | Discharge status: Home / Self Care | Payer: Medicaid Other | Attending: Emergency Medicine | Admitting: Emergency Medicine

## 2017-03-20 ENCOUNTER — Encounter (HOSPITAL_COMMUNITY): Payer: Self-pay | Admitting: Oncology

## 2017-03-20 DIAGNOSIS — D5700 Hb-SS disease with crisis, unspecified: Secondary | ICD-10-CM | POA: Insufficient documentation

## 2017-03-20 LAB — COMPREHENSIVE METABOLIC PANEL
ALT: 24 U/L (ref 14–54)
AST: 46 U/L — AB (ref 15–41)
Albumin: 3.9 g/dL (ref 3.5–5.0)
Alkaline Phosphatase: 60 U/L (ref 38–126)
Anion gap: 8 (ref 5–15)
BUN: 7 mg/dL (ref 6–20)
CHLORIDE: 105 mmol/L (ref 101–111)
CO2: 25 mmol/L (ref 22–32)
Calcium: 9.3 mg/dL (ref 8.9–10.3)
Creatinine, Ser: 0.67 mg/dL (ref 0.44–1.00)
Glucose, Bld: 98 mg/dL (ref 65–99)
Potassium: 5 mmol/L (ref 3.5–5.1)
Sodium: 138 mmol/L (ref 135–145)
Total Bilirubin: 1.2 mg/dL (ref 0.3–1.2)
Total Protein: 7.7 g/dL (ref 6.5–8.1)

## 2017-03-20 LAB — CBC WITH DIFFERENTIAL/PLATELET
Basophils Absolute: 0 10*3/uL (ref 0.0–0.1)
Basophils Relative: 0 %
Eosinophils Absolute: 0.1 10*3/uL (ref 0.0–0.7)
Eosinophils Relative: 2 %
HCT: 26.6 % — ABNORMAL LOW (ref 36.0–46.0)
HEMOGLOBIN: 9.4 g/dL — AB (ref 12.0–15.0)
LYMPHS ABS: 2.8 10*3/uL (ref 0.7–4.0)
LYMPHS PCT: 39 %
MCH: 29 pg (ref 26.0–34.0)
MCHC: 35.3 g/dL (ref 30.0–36.0)
MCV: 82.1 fL (ref 78.0–100.0)
Monocytes Absolute: 0.7 10*3/uL (ref 0.1–1.0)
Monocytes Relative: 10 %
NEUTROS PCT: 49 %
Neutro Abs: 3.5 10*3/uL (ref 1.7–7.7)
Platelets: 605 10*3/uL — ABNORMAL HIGH (ref 150–400)
RBC: 3.24 MIL/uL — AB (ref 3.87–5.11)
RDW: 15.9 % — ABNORMAL HIGH (ref 11.5–15.5)
WBC: 7.2 10*3/uL (ref 4.0–10.5)

## 2017-03-20 LAB — RETICULOCYTES
RBC.: 3.24 MIL/uL — ABNORMAL LOW (ref 3.87–5.11)
Retic Count, Absolute: 259.2 10*3/uL — ABNORMAL HIGH (ref 19.0–186.0)
Retic Ct Pct: 8 % — ABNORMAL HIGH (ref 0.4–3.1)

## 2017-03-20 MED ORDER — SODIUM CHLORIDE 0.45 % IV SOLN
INTRAVENOUS | Status: DC
  Administered 2017-03-20: 125 mL/h via INTRAVENOUS

## 2017-03-20 MED ORDER — HYDROMORPHONE HCL 1 MG/ML IJ SOLN
2.0000 mg | Freq: Once | INTRAMUSCULAR | Status: AC
  Administered 2017-03-20: 2 mg via INTRAVENOUS
  Filled 2017-03-20: qty 2

## 2017-03-20 MED ORDER — HYDROMORPHONE HCL 1 MG/ML IJ SOLN
2.0000 mg | Freq: Once | INTRAMUSCULAR | Status: AC
  Administered 2017-03-20: 2 mg via INTRAMUSCULAR

## 2017-03-20 MED ORDER — HYDROMORPHONE HCL 1 MG/ML IJ SOLN: 2.0000 mg | INTRAMUSCULAR | Status: DC

## 2017-03-20 MED ORDER — ONDANSETRON HCL 4 MG/2ML IJ SOLN
4.0000 mg | INTRAMUSCULAR | Status: DC | PRN
  Administered 2017-03-20: 4 mg via INTRAVENOUS
  Filled 2017-03-20: qty 2

## 2017-03-20 MED ORDER — HYDROMORPHONE HCL 1 MG/ML IJ SOLN
2.0000 mg | Freq: Once | INTRAMUSCULAR | Status: DC
Start: 2017-03-20 — End: 2017-03-20
  Filled 2017-03-20: qty 2

## 2017-03-20 MED ORDER — KETOROLAC TROMETHAMINE 30 MG/ML IJ SOLN
30.0000 mg | INTRAMUSCULAR | Status: AC
  Administered 2017-03-20: 30 mg via INTRAVENOUS
  Filled 2017-03-20: qty 1

## 2017-03-20 MED ORDER — DIPHENHYDRAMINE HCL 50 MG/ML IJ SOLN
25.0000 mg | Freq: Once | INTRAMUSCULAR | Status: AC
  Administered 2017-03-20: 25 mg via INTRAVENOUS
  Filled 2017-03-20: qty 1

## 2017-03-20 NOTE — ED Notes (Signed)
Solicitor attempted EJ IV access x 2 attempts without success.

## 2017-03-20 NOTE — ED Provider Notes (Signed)
[facility] DEPT Provider Note   CSN: [id number] Arrival date & time: 03/20/17  0550     History   Chief Complaint Chief Complaint  Patient presents with  . Sickle Cell Pain Crisis    HPI Carmen [name] is a 26 y.o. female.  HPI   Patient is a 26 year old female with history of sickle cell anemia who presents to the ED with complaint of sickle cell pain crisis. She reports having her typical sickle cell pain to her lower back and bilateral legs that started around midnight. Denies any aggravating or alleviating factors. She notes she was admitted to the hospital yesterday for sickle cell pain, states she thought she was feeling better and left yesterday evening. She states her pain has worsened throughout the night. She reports taking a single dose of 10 mg Percocet at home without relief. Denies fever, chills, headache, cough, shortness of breath, chest pain, abdominal pain, vomiting, saddle anesthesia, urinary or bowel incontinence, numbness, tingling, weakness.  PCP-Dr. Alyson Ingles  Past Medical History:  Diagnosis Date  . Abortion    05/2012  . Headache(784.0)   . Sickle cell crisis [facility])     Patient Active Problem List   Diagnosis Date Noted  . Sickle cell pain crisis ([place]) 03/19/2017  . Sickle cell anemia ([place]) 08/15/2016  . Breast lump 08/15/2016  . Anemia of chronic disease   . 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 S/P laparoscopic cholecystectomy 11/30/2014  . Hypokalemia 11/28/2014  . Leukocytosis 11/22/2014  . Thrombocytosis ([place]) 11/22/2014    Past Surgical History:  Procedure Laterality Date  . CHOLECYSTECTOMY N/A 11/30/2014   Procedure: LAPAROSCOPIC CHOLECYSTECTOMY SINGLE SITE WITH INTRAOPERATIVE CHOLANGIOGRAM;  Surgeon: Michael Boston, MD;  Location: [facility] OR[facility];  Service: General;  Laterality: N/A;  . SPLENECTOMY      OB History    Gravida Para Term Preterm AB Living   1       1     SAB TAB Ectopic Multiple Live Births              Home Medications    Prior to Admission medications   Medication Sig Start Date End Date Taking? Authorizing Provider  ibuprofen (ADVIL,MOTRIN) 800 MG tablet Take 800 mg by mouth 3 (three) times daily as needed for headache, mild pain or moderate pain.  02/23/17  Yes [provider]  morphine (MS CONTIN) 60 MG 12 hr tablet Take 60 mg by mouth 2 (two) times daily as needed for pain.    Yes [provider]  oxyCODONE-acetaminophen (PERCOCET) 10-325 MG tablet Take 1 tablet by mouth every 4 (four) hours as needed for pain.   Yes [provider]    Family History Family History  Problem Relation Age of Onset  . Hypertension Mother   . Sickle cell anemia Sister   . Kidney disease Sister        Lupus  . Arthritis Sister   . Sickle cell anemia Sister   . Sickle cell trait Sister   . Heart disease Maternal Aunt        CABG  . Heart disease Maternal Uncle        CABG  . Lupus Sister     Social History Social History  Substance Use Topics  . Smoking status: Never Smoker  . Smokeless tobacco: Never Used  . Alcohol use No     Allergies   Patient has no known allergies.   Review of Systems Review  of Systems  Musculoskeletal: Positive for back pain and myalgias (bilateral legs).  All other systems reviewed and are negative.    Physical Exam Updated Vital Signs BP (!) 99/44   Pulse 61   Temp 98.3 F (36.8 C) (Oral)   Resp 13   Ht 5\' 8"  (1.727 m)   Wt 181 lb (82.1 kg)   LMP 03/13/2017   SpO2 96%   BMI 27.52 kg/m   Physical Exam  Constitutional: She is oriented to person, place, and time. She appears well-developed and well-nourished. No distress.  HENT:  Head: Normocephalic and atraumatic.  Mouth/Throat: Oropharynx is clear and moist. No oropharyngeal exudate.  Eyes: Conjunctivae and EOM are normal. Right eye exhibits no discharge. Left eye exhibits no discharge. No scleral icterus.  Neck: Normal range of motion. Neck supple.    Cardiovascular: Normal rate, regular rhythm, normal heart sounds and intact distal pulses.   Pulmonary/Chest: Effort normal and breath sounds normal. No respiratory distress. She has no wheezes. She has no rales. She exhibits no tenderness.  Abdominal: Soft. Bowel sounds are normal. She exhibits no distension and no mass. There is no tenderness. There is no rebound and no guarding.  Musculoskeletal: Normal range of motion. She exhibits no edema, tenderness or deformity.  No midline C, T, or L tenderness. Full range of motion of neck and back. Full range of motion of bilateral upper and lower extremities, with 5/5 strength. Sensation intact. 2+ radial and PT pulses. Cap refill <2 seconds. Pt able to stand and ambulate.  Neurological: She is alert and oriented to person, place, and time.  Skin: Skin is warm and dry. She is not diaphoretic.  Nursing note and vitals reviewed.    ED Treatments / Results  Labs (all labs ordered are listed, but only abnormal results are displayed) Labs Reviewed  COMPREHENSIVE METABOLIC PANEL - Abnormal; Notable for the following:       Result Value   AST 46 (*)    All other components within normal limits  CBC WITH DIFFERENTIAL/PLATELET - Abnormal; Notable for the following:    RBC 3.24 (*)    Hemoglobin 9.4 (*)    HCT 26.6 (*)    RDW 15.9 (*)    Platelets 605 (*)    All other components within normal limits  RETICULOCYTES - Abnormal; Notable for the following:    Retic Ct Pct 8.0 (*)    RBC. 3.24 (*)    Retic Count, Manual 259.2 (*)    All other components within normal limits  CBC WITH DIFFERENTIAL/PLATELET    EKG  EKG Interpretation None       Radiology No results found.  Procedures Procedures (including critical care time)  Medications Ordered in ED Medications  0.45 % sodium chloride infusion (125 mL/hr Intravenous New Bag/Given 03/20/17 0909)  ondansetron (ZOFRAN) injection 4 mg (4 mg Intravenous Given 03/20/17 1146)  ketorolac  (TORADOL) 30 MG/ML injection 30 mg (30 mg Intravenous Given 03/20/17 0908)  diphenhydrAMINE (BENADRYL) injection 25 mg (25 mg Intravenous Given 03/20/17 0908)  HYDROmorphone (DILAUDID) injection 2 mg (2 mg Intramuscular Given 03/20/17 0657)  HYDROmorphone (DILAUDID) injection 2 mg (2 mg Intravenous Given 03/20/17 0908)  HYDROmorphone (DILAUDID) injection 2 mg (2 mg Intravenous Given 03/20/17 1032)  HYDROmorphone (DILAUDID) injection 2 mg (2 mg Intravenous Given 03/20/17 1146)     Initial Impression / Assessment and Plan / ED Course  I have reviewed the triage vital signs and the nursing notes.  Pertinent labs & imaging results  that were available during my care of the patient were reviewed by me and considered in my medical decision making (see chart for details).    Patient presents with low back and bilateral leg pain consistent with her typical sickle cell crisis. She notes her pain worsened around midnight. No relief with single dose of Percocet taken at home. Denies fever, cough, shortness of breath. No back pain red flags. VSS. Exam unremarkable. No neuro deficits. Pt given IVF, pain meds (2mg  Dilaudid IV), toradol, and benadryl.  Chart review shows patient was admitted to the hospital yesterday for sickle cell crisis. Patient left AMA yesterday afternoon due to reporting "I am ready to go home". Patient indicated her boyfriend was not helping and she just wanted to be alone by herself. Upon questioning patient myself and the ED she states she thought her pain had improved and she would be okay to go home.   On reevaluation after patient received multiple doses of pain meds in the ED she reports significant improvement of pain and reports she wants to be discharged home. Patient has remained hemodynamically stable while in the ED. Patient tolerating PO. Discussed results and plan for discharge with patient. Advised patient to call her PCP tomorrow morning to schedule a follow-up appointment.  Discussed return precautions.  Final Clinical Impressions(s) / ED Diagnoses   Final diagnoses:  Sickle cell pain crisis [facility])    New Prescriptions New Prescriptions   No medications on file     Renold Don 03/20/17 [place], Donald, MD 03/20/17 2351

## 2017-03-20 NOTE — Discharge Instructions (Signed)
Continue taking her home medications as prescribed. I recommend calling your sickle cell doctor tomorrow morning to schedule a follow-up appointment for this week. Return to the emergency department if symptoms worsen or new onset of fever, chest pain, difficulty breathing, abdominal pain, vomiting, numbness, weakness.

## 2017-03-20 NOTE — ED Notes (Signed)
IV team at bedside 

## 2017-03-20 NOTE — ED Notes (Signed)
Pt having pain in her back and legs, and has taken 10 mg percocet for pain at 00:30.

## 2017-03-20 NOTE — ED Triage Notes (Signed)
Pt c/o typical sickle cell pain to her lower back and b/l legs.  Pt was admitted to the hospital yesterday and left AMA.  States that she thought she was better.  Pt rates pain 9/10.

## 2017-03-22 ENCOUNTER — Emergency Department (HOSPITAL_COMMUNITY)
Admission: EM | Admit: 2017-03-22 | Discharge: 2017-03-22 | Disposition: A | Discharge status: Home / Self Care | Payer: Medicaid Other | Attending: Emergency Medicine | Admitting: Emergency Medicine

## 2017-03-22 ENCOUNTER — Encounter (HOSPITAL_COMMUNITY): Payer: Self-pay

## 2017-03-22 DIAGNOSIS — Z79899 Other long term (current) drug therapy: Secondary | ICD-10-CM | POA: Diagnosis not present

## 2017-03-22 DIAGNOSIS — D5700 Hb-SS disease with crisis, unspecified: Secondary | ICD-10-CM | POA: Diagnosis present

## 2017-03-22 LAB — CBC WITH DIFFERENTIAL/PLATELET
BASOS ABS: 0 10*3/uL (ref 0.0–0.1)
BASOS PCT: 0 %
EOS ABS: 0.1 10*3/uL (ref 0.0–0.7)
EOS PCT: 1 %
HCT: 27.2 % — ABNORMAL LOW (ref 36.0–46.0)
Hemoglobin: 9.5 g/dL — ABNORMAL LOW (ref 12.0–15.0)
LYMPHS ABS: 3.2 10*3/uL (ref 0.7–4.0)
LYMPHS PCT: 42 %
MCH: 28.8 pg (ref 26.0–34.0)
MCHC: 34.9 g/dL (ref 30.0–36.0)
MCV: 82.4 fL (ref 78.0–100.0)
MONO ABS: 0.9 10*3/uL (ref 0.1–1.0)
Monocytes Relative: 12 %
NEUTROS ABS: 3.4 10*3/uL (ref 1.7–7.7)
Neutrophils Relative %: 45 %
PLATELETS: 515 10*3/uL — AB (ref 150–400)
RBC: 3.3 MIL/uL — AB (ref 3.87–5.11)
RDW: 16.4 % — AB (ref 11.5–15.5)
WBC: 7.7 10*3/uL (ref 4.0–10.5)

## 2017-03-22 LAB — COMPREHENSIVE METABOLIC PANEL
ALT: 21 U/L (ref 14–54)
AST: 26 U/L (ref 15–41)
Albumin: 3.9 g/dL (ref 3.5–5.0)
Alkaline Phosphatase: 57 U/L (ref 38–126)
Anion gap: 7 (ref 5–15)
BILIRUBIN TOTAL: 1.3 mg/dL — AB (ref 0.3–1.2)
BUN: 7 mg/dL (ref 6–20)
CHLORIDE: 106 mmol/L (ref 101–111)
CO2: 25 mmol/L (ref 22–32)
Calcium: 9.2 mg/dL (ref 8.9–10.3)
Creatinine, Ser: 0.5 mg/dL (ref 0.44–1.00)
GFR calc Af Amer: >60 mL/min (ref >60)
GFR calc non Af Amer: >60 mL/min (ref >60)
GLUCOSE: 106 mg/dL — AB (ref 65–99)
POTASSIUM: 3.7 mmol/L (ref 3.5–5.1)
SODIUM: 138 mmol/L (ref 135–145)
Total Protein: 7.5 g/dL (ref 6.5–8.1)

## 2017-03-22 LAB — RETICULOCYTES
RBC.: 3.3 MIL/uL — ABNORMAL LOW (ref 3.87–5.11)
RETIC COUNT ABSOLUTE: 290.4 10*3/uL — AB (ref 19.0–186.0)
RETIC CT PCT: 8.8 % — AB (ref 0.4–3.1)

## 2017-03-22 MED ORDER — HYDROMORPHONE HCL 1 MG/ML IJ SOLN
2.0000 mg | INTRAMUSCULAR | Status: AC
  Administered 2017-03-22: 2 mg via INTRAVENOUS
  Filled 2017-03-22: qty 2

## 2017-03-22 MED ORDER — HYDROMORPHONE HCL 1 MG/ML IJ SOLN: 2.0000 mg | INTRAMUSCULAR | Status: AC

## 2017-03-22 MED ORDER — HYDROMORPHONE HCL 1 MG/ML IJ SOLN
2.0000 mg | INTRAMUSCULAR | Status: DC
  Administered 2017-03-22: 2 mg via INTRAVENOUS
  Filled 2017-03-22: qty 2

## 2017-03-22 MED ORDER — HYDROMORPHONE HCL 1 MG/ML IJ SOLN: 2.0000 mg | INTRAMUSCULAR | Status: DC

## 2017-03-22 MED ORDER — KETOROLAC TROMETHAMINE 15 MG/ML IJ SOLN
15.0000 mg | INTRAMUSCULAR | Status: AC
  Administered 2017-03-22: 15 mg via INTRAVENOUS
  Filled 2017-03-22: qty 1

## 2017-03-22 MED ORDER — DEXTROSE-NACL 5-0.45 % IV SOLN
INTRAVENOUS | Status: DC
  Administered 2017-03-22: 04:00:00 via INTRAVENOUS

## 2017-03-22 NOTE — ED Notes (Signed)
Called pharmacy for pain meds machines are out

## 2017-03-22 NOTE — ED Notes (Signed)
Bed: WA25 Expected date:  Expected time:  Means of arrival:  Comments: TR 1 

## 2017-03-22 NOTE — ED Triage Notes (Signed)
Pt reports worsening generalized back pain r/t sickle cell pain. Pt reports taking x2 10mg  Percocet @ 2000 and x1 60MG  Morphine tablet w/o relief. Pt denies dyspnea and cp.

## 2017-03-22 NOTE — ED Provider Notes (Signed)
[facility] DEPT Provider Note   CSN: [id number] Arrival date & time: 03/22/17  0208  By signing my name below, I, Oleh Genin, attest that this documentation has been prepared under the direction and in the presence of [place], Cayla Wiegand, MD. Electronically Signed: Oleh Genin, Scribe. 03/22/17. 3:35 AM.   History   Chief Complaint Chief Complaint  Patient presents with  . Sickle Cell Pain Crisis    HPI Carmen [name] is a 26 y.o. female with history of sickle cell disease requiring multiple hospitalizations who presents to the ED with a suspected pain crisis.  The history is provided by the patient. No language interpreter was used.  Sickle Cell Pain Crisis  Location:  Back and lower extremity Severity:  Severe Onset quality:  Gradual Duration:  12 hours Similar to previous crisis episodes: yes   Timing:  Constant Progression:  Worsening Chronicity:  Chronic Associated symptoms: no chest pain, no cough, no fever, no nausea, no shortness of breath, no sore throat and no vomiting     Past Medical History:  Diagnosis Date  . Abortion    05/2012  . Headache(784.0)   . Sickle cell crisis [facility])     Patient Active Problem List   Diagnosis Date Noted  . Sickle cell pain crisis ([place]) 03/19/2017  . Sickle cell anemia ([place]) 08/15/2016  . Breast lump 08/15/2016  . Anemia of chronic disease   . 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 S/P laparoscopic cholecystectomy 11/30/2014  . Hypokalemia 11/28/2014  . Leukocytosis 11/22/2014  . Thrombocytosis ([place]) 11/22/2014    Past Surgical History:  Procedure Laterality Date  . CHOLECYSTECTOMY N/A 11/30/2014   Procedure: LAPAROSCOPIC CHOLECYSTECTOMY SINGLE SITE WITH INTRAOPERATIVE CHOLANGIOGRAM;  Surgeon: Michael Boston, MD;  Location: [facility] ORS;  Service: General;  Laterality: N/A;  . SPLENECTOMY      OB History    Gravida Para Term Preterm AB Living   1       1     SAB TAB Ectopic Multiple Live  Births                   Home Medications    Prior to Admission medications   Medication Sig Start Date End Date Taking? Authorizing Provider  ibuprofen (ADVIL,MOTRIN) 800 MG tablet Take 800 mg by mouth 3 (three) times daily as needed for headache, mild pain or moderate pain.  02/23/17  Yes [provider]  morphine (MS CONTIN) 60 MG 12 hr tablet Take 60 mg by mouth 2 (two) times daily as needed for pain.    Yes [provider]  oxyCODONE-acetaminophen (PERCOCET) 10-325 MG tablet Take 1 tablet by mouth every 4 (four) hours as needed for pain.   Yes [provider]    Family History Family History  Problem Relation Age of Onset  . Hypertension Mother   . Sickle cell anemia Sister   . Kidney disease Sister        Lupus  . Arthritis Sister   . Sickle cell anemia Sister   . Sickle cell trait Sister   . Heart disease Maternal Aunt        CABG  . Heart disease Maternal Uncle        CABG  . Lupus Sister     Social History Social History  Substance Use Topics  . Smoking status: Never Smoker  . Smokeless tobacco: Never Used  . Alcohol use No     Allergies  Patient has no known allergies.   Review of Systems Review of Systems  Constitutional: Negative for chills and fever.  HENT: Negative for ear pain and sore throat.   Eyes: Negative for pain and visual disturbance.  Respiratory: Negative for cough and shortness of breath.   Cardiovascular: Negative for chest pain and palpitations.  Gastrointestinal: Negative for abdominal pain, nausea and vomiting.  Genitourinary: Negative for dysuria and hematuria.  Musculoskeletal: Positive for arthralgias and back pain.  Skin: Negative for color change and rash.  Neurological: Negative for seizures and syncope.  All other systems reviewed and are negative.    Physical Exam Updated Vital Signs BP 125/83 (BP Location: Left Arm)   Pulse (!) 59   Temp 98.6 F (37 C) (Oral)   Resp 16   LMP  03/13/2017   SpO2 100%   Physical Exam  Constitutional: She appears well-developed and well-nourished. No distress.  HENT:  Head: Normocephalic and atraumatic.  Mouth/Throat: Oropharynx is clear and moist.  Eyes: Conjunctivae are normal.  Neck: Neck supple. No JVD present. Carotid bruit is not present.  Cardiovascular: Normal rate, regular rhythm and intact distal pulses.   No murmur heard. Pulmonary/Chest: Effort normal and breath sounds normal. No stridor. No respiratory distress. She exhibits no tenderness.  Abdominal: Soft. There is no tenderness.  Musculoskeletal: She exhibits no edema.  Neurological: She is alert. She displays normal reflexes.  Skin: Skin is warm and dry.  Psychiatric: She has a normal mood and affect.  Nursing note and vitals reviewed.    ED Treatments / Results   Vitals:   03/22/17 0232 03/22/17 0513  BP: 125/83 (!) 134/98  Pulse: (!) 59 63  Resp: 16 18  Temp: 98.6 F (37 C) 98.6 F (37 C)    Labs (all labs ordered are listed, but only abnormal results are displayed)  Results for orders placed or performed during the hospital encounter of 03/22/17  Comprehensive metabolic panel  Result Value Ref Range   Sodium 138 135 - 145 mmol/L   Potassium 3.7 3.5 - 5.1 mmol/L   Chloride 106 101 - 111 mmol/L   CO2 25 22 - 32 mmol/L   Glucose, Bld 106 (H) 65 - 99 mg/dL   BUN 7 6 - 20 mg/dL   Creatinine, Ser 0.50 0.44 - 1.00 mg/dL   Calcium 9.2 8.9 - 10.3 mg/dL   Total Protein 7.5 6.5 - 8.1 g/dL   Albumin 3.9 3.5 - 5.0 g/dL   AST 26 15 - 41 U/L   ALT 21 14 - 54 U/L   Alkaline Phosphatase 57 38 - 126 U/L   Total Bilirubin 1.3 (H) 0.3 - 1.2 mg/dL   GFR calc non Af Amer >60 >60 mL/min   GFR calc Af Amer >60 >60 mL/min   Anion gap 7 5 - 15  CBC with Differential  Result Value Ref Range   WBC 7.7 4.0 - 10.5 K/uL   RBC 3.30 (L) 3.87 - 5.11 MIL/uL   Hemoglobin 9.5 (L) 12.0 - 15.0 g/dL   HCT 27.2 (L) 36.0 - 46.0 %   MCV 82.4 78.0 - 100.0 fL   MCH 28.8  26.0 - 34.0 pg   MCHC 34.9 30.0 - 36.0 g/dL   RDW 16.4 (H) 11.5 - 15.5 %   Platelets 515 (H) 150 - 400 K/uL   Neutrophils Relative % 45 %   Neutro Abs 3.4 1.7 - 7.7 K/uL   Lymphocytes Relative 42 %   Lymphs Abs 3.2 0.7 -  4.0 K/uL   Monocytes Relative 12 %   Monocytes Absolute 0.9 0.1 - 1.0 K/uL   Eosinophils Relative 1 %   Eosinophils Absolute 0.1 0.0 - 0.7 K/uL   Basophils Relative 0 %   Basophils Absolute 0.0 0.0 - 0.1 K/uL  Reticulocytes  Result Value Ref Range   Retic Ct Pct 8.8 (H) 0.4 - 3.1 %   RBC. 3.30 (L) 3.87 - 5.11 MIL/uL   Retic Count, Manual 290.4 (H) 19.0 - 186.0 K/uL   No results found.  Radiology No results found.  Procedures Procedures (including critical care time)  Medications Ordered in ED  Medications  dextrose 5 %-0.45 % sodium chloride infusion ( Intravenous New Bag/Given 03/22/17 0356)  HYDROmorphone (DILAUDID) injection 2 mg (not administered)    Or  HYDROmorphone (DILAUDID) injection 2 mg (not administered)  ketorolac (TORADOL) 15 MG/ML injection 15 mg (15 mg Intravenous Given 03/22/17 0356)  HYDROmorphone (DILAUDID) injection 2 mg (2 mg Intravenous Given 03/22/17 0357)    Or  HYDROmorphone (DILAUDID) injection 2 mg ( Subcutaneous See Alternative 03/22/17 0357)  HYDROmorphone (DILAUDID) injection 2 mg (2 mg Intravenous Given 03/22/17 0513)    Or  HYDROmorphone (DILAUDID) injection 2 mg ( Subcutaneous See Alternative 03/22/17 0513)  HYDROmorphone (DILAUDID) injection 2 mg (2 mg Intravenous Given 03/22/17 0615)    Or  HYDROmorphone (DILAUDID) injection 2 mg ( Subcutaneous See Alternative 03/22/17 0615)    Stating she is feeling better post protocol and wants to go home  Final Clinical Impressions(s) / ED Diagnoses  Sickle cell crisis:  Intractable pain, chest pain, shortness of breath,  fever > 101, inability to urinate weakness, bleeding or any concerns.    The patient is nontoxic-appearing on exam and vital signs are within normal limits.    I have reviewed the triage vital signs and the nursing notes. Pertinent labs &imaging results that were available during my care of the patient were reviewed by me and considered in my medical decision making (see chart for details).  After history, exam, and medical workup I feel the patient has been appropriately medically screened and is safe for discharge home. Pertinent diagnoses were discussed with the patient. Patient was given return precautions.   I personally performed the services described in this documentation, which was scribed in my presence. The recorded information has been reviewed and is accurate.     Lavonn Maxcy, MD 03/22/17 [phone]

## 2017-03-22 NOTE — Discharge Summary (Signed)
Physician Discharge Summary  Carmen [name] VHQ:[id number] DOB: 01/17/1991 DOA: 03/18/2017  PCP: [name], MD  Admit date: 03/18/2017  Discharge date: 03/22/2017  Discharge Diagnoses:  Principal Problem:   Sickle cell pain crisis [facility]) Active Problems:   Sickle cell anemia (Carmen [name])   Discharge Condition: Stable  Disposition:   Diet: Regular  Wt Readings from Last 3 Encounters:  03/20/17 181 lb (82.1 kg)  03/19/17 181 lb (82.1 kg)  02/27/17 175 lb (79.4 kg)    History of present illness:  Carmen [name] is a 26 y.o. woman with history of Sickle cell disease who presents to the ED for evaluation of increased pain in her back and legs that she feels is consistent with sickle cell crisis.  She reports a pain level 10 out 10.   She denies chest pain or shortness of breath.  No headache.  No light-headedness or LOC.  No nausea or vomiting.  No abdominal pain or diarrhea.    ED Course: Hemoglobin 9.7, which appears to be her baseline.  Urine pregnancy test negative.  She has received 8mg  of IV dilaudid in the ED.  She says that toradol only partially controls her pain.  Hospitalist asked to admit.  Hospital Course:  Patient admitted for sickle cell pain crisis and managed with the standard sickle cell crisis pain management protocol. When seen the following morning, she said "I am ready to go home". When probed further, she indicated her pain is at 5/10 as against her goal of 3/10, but she wants to leave because her "boyfriend" is not helping and she just want to be alone by herself. She denies any fever, no chest pain, no SOB. Patient left the hospital against medical advise. (AMA)  Discharge Exam: Vitals:   03/19/17 0558 03/19/17 0945  BP:    Pulse:    Resp: 18 15  Temp:     Vitals:   03/19/17 0430 03/19/17 0525 03/19/17 0558 03/19/17 0945  BP: 125/77 124/86    Pulse: 60 (!) 57    Resp: 15 16 18 15   Temp:  98.6 F (37 C)    TempSrc:  Oral    SpO2: 100% 100% 100%  100%  Weight:  181 lb (82.1 kg)    Height:  5\' 4"  (1.626 m)     Discharge Instructions   Allergies as of 03/19/2017   No Known Allergies     Medication List    ASK your doctor about these medications   ibuprofen 800 MG tablet Commonly known as:  ADVIL,MOTRIN Take 800 mg by mouth 3 (three) times daily as needed for headache, mild pain or moderate pain.   morphine 60 MG 12 hr tablet Commonly known as:  MS CONTIN Take 60 mg by mouth 2 (two) times daily as needed for pain.   oxyCODONE-acetaminophen 10-325 MG tablet Commonly known as:  PERCOCET Take 1 tablet by mouth every 4 (four) hours as needed for pain.        The results of significant diagnostics from this hospitalization (including imaging, microbiology, ancillary and laboratory) are listed below for reference.    Significant Diagnostic Studies: No results found.  Microbiology: No results found for this or any previous visit (from the past 240 hour(s)).   Labs: Basic Metabolic Panel:  Recent Labs Lab 03/18/17 2304 03/20/17 0635 03/22/17 0543  NA 142 138 138  K 3.7 5.0 3.7  CL 109 105 106  CO2 25 25 25   GLUCOSE 91 98 106*  BUN  11 7 7   CREATININE 0.56 0.67 0.50  CALCIUM 9.3 9.3 9.2   Liver Function Tests:  Recent Labs Lab 03/18/17 2304 03/20/17 0635 03/22/17 0543  AST 22 46* 26  ALT 15 24 21   ALKPHOS 71 60 57  BILITOT 1.0 1.2 1.3*  PROT 7.6 7.7 7.5  ALBUMIN 4.0 3.9 3.9   No results for input(s): LIPASE, AMYLASE in the last 168 hours. No results for input(s): AMMONIA in the last 168 hours. CBC:  Recent Labs Lab 03/18/17 2304 03/20/17 0850 03/22/17 0543  WBC 10.6* 7.2 7.7  NEUTROABS 3.6 3.5 3.4  HGB 9.7* 9.4* 9.5*  HCT 27.9* 26.6* 27.2*  MCV 82.1 82.1 82.4  PLT 619* 605* 515*   Cardiac Enzymes: No results for input(s): CKTOTAL, CKMB, CKMBINDEX, TROPONINI in the last 168 hours. BNP: Invalid input(s): POCBNP CBG: No results for input(s): GLUCAP in the last 168 hours.  Time  coordinating discharge: 50 minutes  Signed:  Payeton Germani, [facility] 03/22/2017, 12:26 PM

## 2017-03-23 ENCOUNTER — Encounter (HOSPITAL_COMMUNITY): Payer: Self-pay

## 2017-03-23 ENCOUNTER — Inpatient Hospital Stay (HOSPITAL_COMMUNITY)
Admission: EM | Admit: 2017-03-23 | Discharge: 2017-03-25 | DRG: 812 | Discharge status: Against Medical Advice | Payer: Medicaid Other | Attending: Internal Medicine | Admitting: Internal Medicine

## 2017-03-23 ENCOUNTER — Emergency Department (HOSPITAL_COMMUNITY): Payer: Medicaid Other

## 2017-03-23 DIAGNOSIS — G894 Chronic pain syndrome: Secondary | ICD-10-CM | POA: Diagnosis present

## 2017-03-23 DIAGNOSIS — D5700 Hb-SS disease with crisis, unspecified: Secondary | ICD-10-CM | POA: Diagnosis not present

## 2017-03-23 DIAGNOSIS — Z79899 Other long term (current) drug therapy: Secondary | ICD-10-CM

## 2017-03-23 DIAGNOSIS — D638 Anemia in other chronic diseases classified elsewhere: Secondary | ICD-10-CM | POA: Diagnosis present

## 2017-03-23 DIAGNOSIS — D571 Sickle-cell disease without crisis: Secondary | ICD-10-CM | POA: Diagnosis present

## 2017-03-23 DIAGNOSIS — F6089 Other specific personality disorders: Secondary | ICD-10-CM | POA: Diagnosis present

## 2017-03-23 LAB — RETICULOCYTES
RBC.: 3.5 MIL/uL — ABNORMAL LOW (ref 3.87–5.11)
RETIC CT PCT: 7.7 % — AB (ref 0.4–3.1)
Retic Count, Absolute: 269.5 10*3/uL — ABNORMAL HIGH (ref 19.0–186.0)

## 2017-03-23 LAB — COMPREHENSIVE METABOLIC PANEL
ALBUMIN: 4.6 g/dL (ref 3.5–5.0)
ALK PHOS: 66 U/L (ref 38–126)
ALT: 22 U/L (ref 14–54)
AST: 26 U/L (ref 15–41)
Anion gap: 8 (ref 5–15)
BUN: 10 mg/dL (ref 6–20)
CALCIUM: 9.5 mg/dL (ref 8.9–10.3)
CHLORIDE: 105 mmol/L (ref 101–111)
CO2: 24 mmol/L (ref 22–32)
CREATININE: 0.63 mg/dL (ref 0.44–1.00)
GFR calc non Af Amer: >60 mL/min (ref >60)
GLUCOSE: 97 mg/dL (ref 65–99)
Potassium: 3.6 mmol/L (ref 3.5–5.1)
SODIUM: 137 mmol/L (ref 135–145)
Total Bilirubin: 1.9 mg/dL — ABNORMAL HIGH (ref 0.3–1.2)
Total Protein: 8.6 g/dL — ABNORMAL HIGH (ref 6.5–8.1)

## 2017-03-23 LAB — CBC WITH DIFFERENTIAL/PLATELET
Basophils Absolute: 0 10*3/uL (ref 0.0–0.1)
Basophils Relative: 0 %
EOS ABS: 0.2 10*3/uL (ref 0.0–0.7)
Eosinophils Relative: 3 %
HEMATOCRIT: 28.5 % — AB (ref 36.0–46.0)
HEMOGLOBIN: 10.2 g/dL — AB (ref 12.0–15.0)
Lymphocytes Relative: 42 %
Lymphs Abs: 3.5 10*3/uL (ref 0.7–4.0)
MCH: 29.1 pg (ref 26.0–34.0)
MCHC: 35.8 g/dL (ref 30.0–36.0)
MCV: 81.4 fL (ref 78.0–100.0)
MONOS PCT: 11 %
Monocytes Absolute: 0.9 10*3/uL (ref 0.1–1.0)
NEUTROS PCT: 44 %
Neutro Abs: 3.7 10*3/uL (ref 1.7–7.7)
Platelets: 549 10*3/uL — ABNORMAL HIGH (ref 150–400)
RBC: 3.5 MIL/uL — ABNORMAL LOW (ref 3.87–5.11)
RDW: 16.1 % — AB (ref 11.5–15.5)
WBC: 8.3 10*3/uL (ref 4.0–10.5)

## 2017-03-23 LAB — LACTATE DEHYDROGENASE: LDH: 268 U/L — AB (ref 98–192)

## 2017-03-23 MED ORDER — DIPHENHYDRAMINE HCL 50 MG/ML IJ SOLN: 12.5000 mg | Freq: Four times a day (QID) | INTRAMUSCULAR | Status: DC | PRN

## 2017-03-23 MED ORDER — HYDROMORPHONE HCL 2 MG/ML IJ SOLN
2.0000 mg | Freq: Once | INTRAMUSCULAR | Status: AC
  Administered 2017-03-23: 2 mg via INTRAVENOUS
  Filled 2017-03-23: qty 1

## 2017-03-23 MED ORDER — PROMETHAZINE HCL 25 MG PO TABS
25.0000 mg | ORAL_TABLET | ORAL | Status: DC | PRN
  Filled 2017-03-23: qty 1

## 2017-03-23 MED ORDER — DIPHENHYDRAMINE HCL 50 MG/ML IJ SOLN
25.0000 mg | Freq: Once | INTRAMUSCULAR | Status: AC
  Administered 2017-03-23: 25 mg via INTRAVENOUS
  Filled 2017-03-23: qty 1

## 2017-03-23 MED ORDER — HYDROMORPHONE HCL 1 MG/ML IJ SOLN: 2.0000 mg | INTRAMUSCULAR | Status: AC

## 2017-03-23 MED ORDER — MORPHINE SULFATE ER 30 MG PO TBCR
60.0000 mg | EXTENDED_RELEASE_TABLET | Freq: Two times a day (BID) | ORAL | Status: DC
Start: 2017-03-23 — End: 2017-03-25
  Administered 2017-03-23 – 2017-03-25 (×5): 60 mg via ORAL
  Filled 2017-03-23 (×6): qty 2

## 2017-03-23 MED ORDER — HYDROMORPHONE HCL 1 MG/ML IJ SOLN
2.0000 mg | INTRAMUSCULAR | Status: AC
  Administered 2017-03-23: 2 mg via INTRAVENOUS
  Filled 2017-03-23: qty 2

## 2017-03-23 MED ORDER — SODIUM CHLORIDE 0.9% FLUSH
10.0000 mL | INTRAVENOUS | Status: DC | PRN
  Administered 2017-03-24: 10 mL
  Filled 2017-03-23: qty 40

## 2017-03-23 MED ORDER — HYDROMORPHONE HCL 1 MG/ML IJ SOLN
0.5000 mg | Freq: Once | INTRAMUSCULAR | Status: AC
  Administered 2017-03-23: 0.5 mg via SUBCUTANEOUS
  Filled 2017-03-23: qty 0.5

## 2017-03-23 MED ORDER — DEXTROSE-NACL 5-0.45 % IV SOLN
INTRAVENOUS | Status: DC
  Administered 2017-03-23 – 2017-03-25 (×2): via INTRAVENOUS

## 2017-03-23 MED ORDER — SODIUM CHLORIDE 0.9% FLUSH
9.0000 mL | INTRAVENOUS | Status: DC | PRN
Start: 2017-03-23 — End: 2017-03-23

## 2017-03-23 MED ORDER — ENOXAPARIN SODIUM 40 MG/0.4ML ~~LOC~~ SOLN
40.0000 mg | SUBCUTANEOUS | Status: DC
  Administered 2017-03-23 – 2017-03-25 (×3): 40 mg via SUBCUTANEOUS
  Filled 2017-03-23 (×4): qty 0.4

## 2017-03-23 MED ORDER — DIPHENHYDRAMINE HCL 12.5 MG/5ML PO ELIX: 12.5000 mg | ORAL_SOLUTION | Freq: Four times a day (QID) | ORAL | Status: DC | PRN

## 2017-03-23 MED ORDER — POLYETHYLENE GLYCOL 3350 17 G PO PACK: 17.0000 g | PACK | Freq: Every day | ORAL | Status: DC | PRN

## 2017-03-23 MED ORDER — ONDANSETRON HCL 4 MG/2ML IJ SOLN
4.0000 mg | Freq: Four times a day (QID) | INTRAMUSCULAR | Status: DC | PRN
  Administered 2017-03-23 – 2017-03-24 (×2): 4 mg via INTRAVENOUS
  Filled 2017-03-23 (×3): qty 2

## 2017-03-23 MED ORDER — KETOROLAC TROMETHAMINE 15 MG/ML IJ SOLN
15.0000 mg | INTRAMUSCULAR | Status: AC
  Administered 2017-03-23: 15 mg via INTRAVENOUS
  Filled 2017-03-23: qty 1

## 2017-03-23 MED ORDER — KETOROLAC TROMETHAMINE 15 MG/ML IJ SOLN
15.0000 mg | Freq: Four times a day (QID) | INTRAMUSCULAR | Status: DC
  Administered 2017-03-23: 15 mg via INTRAVENOUS
  Filled 2017-03-23: qty 1

## 2017-03-23 MED ORDER — HYDROMORPHONE 1 MG/ML IV SOLN
INTRAVENOUS | Status: DC
  Administered 2017-03-23: 7.2 mg via INTRAVENOUS
  Administered 2017-03-23: 4.8 mg via INTRAVENOUS
  Administered 2017-03-23: 6 mg via INTRAVENOUS
  Administered 2017-03-23: 3 mg via INTRAVENOUS
  Administered 2017-03-24: 2.4 mg via INTRAVENOUS
  Administered 2017-03-24 (×2): 25 mg via INTRAVENOUS
  Administered 2017-03-25: 6.6 mg via INTRAVENOUS
  Administered 2017-03-25: 25 mg via INTRAVENOUS
  Administered 2017-03-25: 6 mg via INTRAVENOUS
  Filled 2017-03-23 (×3): qty 25

## 2017-03-23 MED ORDER — KETOROLAC TROMETHAMINE 15 MG/ML IJ SOLN
30.0000 mg | Freq: Four times a day (QID) | INTRAMUSCULAR | Status: DC
  Administered 2017-03-23 – 2017-03-25 (×9): 30 mg via INTRAVENOUS
  Filled 2017-03-23 (×9): qty 2

## 2017-03-23 MED ORDER — NALOXONE HCL 0.4 MG/ML IJ SOLN: 0.4000 mg | INTRAMUSCULAR | Status: DC | PRN

## 2017-03-23 MED ORDER — SENNOSIDES-DOCUSATE SODIUM 8.6-50 MG PO TABS
1.0000 | ORAL_TABLET | Freq: Two times a day (BID) | ORAL | Status: DC
  Administered 2017-03-23 – 2017-03-25 (×5): 1 via ORAL
  Filled 2017-03-23 (×5): qty 1

## 2017-03-23 MED ORDER — HYDROMORPHONE 1 MG/ML IV SOLN
INTRAVENOUS | Status: DC
  Administered 2017-03-23: 25 mg via INTRAVENOUS
  Filled 2017-03-23: qty 25

## 2017-03-23 NOTE — ED Provider Notes (Signed)
[place] DEPT Provider Note   CSN: [id number] Arrival date & time: 03/23/17  0128     History   Chief Complaint Chief Complaint  Patient presents with  . Sickle Cell Pain Crisis    HPI Carmen [name] is a 26 y.o. female.  Patient presents emergency department with chief complaint of sickle cell pain. She was here yesterday for the same. She states that the pain originated in her low back and lower extremities, but is now spreading to her upper extremities. She states that she has been trying to manage her symptoms at home with her Percocet, but has not been getting any relief. She denies any fevers, chills, chest pain. She states that she has had some mild shortness of breath, but denies any productive cough. She denies any other associated symptoms. Denies any rash, swelling. There are no other associated symptoms or modifying factors.   The history is provided by the patient. No language interpreter was used.    Past Medical History:  Diagnosis Date  . Abortion    05/2012  . Headache(784.0)   . Sickle cell crisis [facility])     Patient Active Problem List   Diagnosis Date Noted  . Sickle cell pain crisis ([place]) 03/19/2017  . Sickle cell anemia ([place]) 08/15/2016  . Breast lump 08/15/2016  . Anemia of chronic disease   . 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 S/P laparoscopic cholecystectomy 11/30/2014  . Hypokalemia 11/28/2014  . Leukocytosis 11/22/2014  . Thrombocytosis ([place]) 11/22/2014    Past Surgical History:  Procedure Laterality Date  . CHOLECYSTECTOMY N/A 11/30/2014   Procedure: LAPAROSCOPIC CHOLECYSTECTOMY SINGLE SITE WITH INTRAOPERATIVE CHOLANGIOGRAM;  Surgeon: Michael Boston, MD;  Location: [facility] ORS;  Service: General;  Laterality: N/A;  . SPLENECTOMY      OB History    Gravida Para Term Preterm AB Living   1       1     SAB TAB Ectopic Multiple Live Births                   Home Medications    Prior to Admission  medications   Medication Sig Start Date End Date Taking? Authorizing Provider  ibuprofen (ADVIL,MOTRIN) 800 MG tablet Take 800 mg by mouth 3 (three) times daily as needed for headache, mild pain or moderate pain.  02/23/17  Yes [provider]  morphine (MS CONTIN) 60 MG 12 hr tablet Take 60 mg by mouth 2 (two) times daily as needed for pain.    Yes [provider]  oxyCODONE-acetaminophen (PERCOCET) 10-325 MG tablet Take 1 tablet by mouth every 4 (four) hours as needed for pain.   Yes [provider]    Family History Family History  Problem Relation Age of Onset  . Hypertension Mother   . Sickle cell anemia Sister   . Kidney disease Sister        Lupus  . Arthritis Sister   . Sickle cell anemia Sister   . Sickle cell trait Sister   . Heart disease Maternal Aunt        CABG  . Heart disease Maternal Uncle        CABG  . Lupus Sister     Social History Social History  Substance Use Topics  . Smoking status: Never Smoker  . Smokeless tobacco: Never Used  . Alcohol use No     Allergies   Patient has no known allergies.   Review  of Systems Review of Systems  All other systems reviewed and are negative.    Physical Exam Updated Vital Signs BP (!) 145/92   Pulse (!) 56   Temp 98.3 F (36.8 C) (Oral)   Resp (!) 22   LMP 03/13/2017   SpO2 97%   Physical Exam  Constitutional: She is oriented to person, place, and time. She appears well-developed and well-nourished.  HENT:  Head: Normocephalic and atraumatic.  Eyes: Conjunctivae and EOM are normal. Pupils are equal, round, and reactive to light.  Neck: Normal range of motion. Neck supple.  Cardiovascular: Normal rate and regular rhythm.  Exam reveals no gallop and no friction rub.   No murmur heard. Pulmonary/Chest: Effort normal and breath sounds normal. No respiratory distress. She has no wheezes. She has no rales. She exhibits no tenderness.  Abdominal: Soft. Bowel sounds are normal.  She exhibits no distension and no mass. There is no tenderness. There is no rebound and no guarding.  Musculoskeletal: Normal range of motion. She exhibits no edema or tenderness.  Neurological: She is alert and oriented to person, place, and time.  Skin: Skin is warm and dry.  Psychiatric: She has a normal mood and affect. Her behavior is normal. Judgment and thought content normal.  Nursing note and vitals reviewed.    ED Treatments / Results  Labs (all labs ordered are listed, but only abnormal results are displayed) Labs Reviewed  COMPREHENSIVE METABOLIC PANEL - Abnormal; Notable for the following:       Result Value   Total Protein 8.6 (*)    Total Bilirubin 1.9 (*)    All other components within normal limits  CBC WITH DIFFERENTIAL/PLATELET - Abnormal; Notable for the following:    RBC 3.50 (*)    Hemoglobin 10.2 (*)    HCT 28.5 (*)    RDW 16.1 (*)    Platelets 549 (*)    All other components within normal limits  RETICULOCYTES - Abnormal; Notable for the following:    Retic Ct Pct 7.7 (*)    RBC. 3.50 (*)    Retic Count, Manual 269.5 (*)    All other components within normal limits    EKG  EKG Interpretation None       Radiology No results found.  Procedures Procedures (including critical care time)  Medications Ordered in ED Medications  ketorolac (TORADOL) 15 MG/ML injection 15 mg (not administered)  HYDROmorphone (DILAUDID) injection 2 mg (not administered)    Or  HYDROmorphone (DILAUDID) injection 2 mg (not administered)  HYDROmorphone (DILAUDID) injection 2 mg (not administered)    Or  HYDROmorphone (DILAUDID) injection 2 mg (not administered)  promethazine (PHENERGAN) tablet 25 mg (not administered)  diphenhydrAMINE (BENADRYL) injection 25 mg (not administered)  HYDROmorphone (DILAUDID) injection 0.5 mg (0.5 mg Subcutaneous Given 03/23/17 0232)     Initial Impression / Assessment and Plan / ED Course  I have reviewed the triage vital signs and  the nursing notes.  Pertinent labs & imaging results that were available during my care of the patient were reviewed by me and considered in my medical decision making (see chart for details).     Patient was sickle cell pain crisis. Seen yesterday for the same. Reports worsening symptoms. She has been unable to control her symptoms with her regular home pain medicine. She states that she thinks she is being admitted to the hospital. Will give patient pain medicine in the ED, and reassess.  After receiving 3 rounds of Dilaudid in  the emergency department, the patient still complains of pain. This is her third visit in 6 days. She states that she feels she needs to be admitted to the hospital. Given that her pain is uncontrolled, and she has tried outpatient therapy, will consult hospitalist for admission.  Appreciate Dr. Tamala Julian for bringing the patient to the hospital.  Final Clinical Impressions(s) / ED Diagnoses   Final diagnoses:  Sickle cell pain crisis [facility])    New Prescriptions New Prescriptions   No medications on file     Montine Circle, Hershal Coria 03/23/17 0553    Molpus, Jenny Reichmann, MD 03/23/17 [phone]

## 2017-03-23 NOTE — ED Notes (Signed)
No dilaudid available, pharmacy is sending some up to the ED asap.

## 2017-03-23 NOTE — H&P (Signed)
History and Physical    Carmen [name] WHQ:[id number] DOB: 1991/04/06 DOA: 03/23/2017  Referring MD/NP/PA: Lorre Munroe PA-C PCP: [name], MD  Patient coming from: home   Chief Complaint: Back pain  HPI: Carmen [name] is a 26 y.o. female with medical history significant of sickle cell anemia; who presents with complaints of back pain. Symptoms have been progressively worsening over the last 1 week. She reports that initially started in her back and lower legs, but it has spread into her upper arms as well. She tried utilizing home medications of Percocet and morphine without relief of symptoms. Patient reports that she's been back and forth to the emergency department at least 3 times this week trying to avoid being admitted. Pain symptoms would be temporary improved, but when patient would go home with be awoken out of her sleep in unbearable pain she was not able to control. Associated symptoms include mild shortness of breath. Denies any joint swelling, fever, chills, dysuria, urinary frequency, abdominal pain, nausea, vomiting, or diarrhea.  ED Course: Upon admission patient was noted to have hemoglobin of 10.7 and all other labs appear near patient's baseline. Chest x-ray showed no acute abnormalities. Patient pain was not relieved despite multiple rounds of IV Dilaudid. TRH called to admit for pain control.  Review of Systems: As per HPI otherwise 10 point review of systems negative.   Past Medical History:  Diagnosis Date  . Abortion    05/2012  . Headache(784.0)   . Sickle cell crisis [facility])     Past Surgical History:  Procedure Laterality Date  . CHOLECYSTECTOMY N/A 11/30/2014   Procedure: LAPAROSCOPIC CHOLECYSTECTOMY SINGLE SITE WITH INTRAOPERATIVE CHOLANGIOGRAM;  Surgeon: Michael Boston, MD;  Location: [facility] ORS;  Service: General;  Laterality: N/A;  . SPLENECTOMY       reports that she has never smoked. She has never used smokeless tobacco. She reports that she does not  drink alcohol or use drugs.  No Known Allergies  Family History  Problem Relation Age of Onset  . Hypertension Mother   . Sickle cell anemia Sister   . Kidney disease Sister        Lupus  . Arthritis Sister   . Sickle cell anemia Sister   . Sickle cell trait Sister   . Heart disease Maternal Aunt        CABG  . Heart disease Maternal Uncle        CABG  . Lupus Sister     Prior to Admission medications   Medication Sig Start Date End Date Taking? Authorizing Provider  ibuprofen (ADVIL,MOTRIN) 800 MG tablet Take 800 mg by mouth 3 (three) times daily as needed for headache, mild pain or moderate pain.  02/23/17  Yes [provider]  morphine (MS CONTIN) 60 MG 12 hr tablet Take 60 mg by mouth 2 (two) times daily as needed for pain.    Yes [provider]  oxyCODONE-acetaminophen (PERCOCET) 10-325 MG tablet Take 1 tablet by mouth every 4 (four) hours as needed for pain.   Yes [provider]    Physical Exam:    Constitutional: Young female who appears to be in moderate distress Vitals:   03/23/17 0300 03/23/17 0400 03/23/17 0451 03/23/17 0500  BP: (!) 145/92 (!) 150/88  121/81  Pulse: (!) 56 (!) 46  71  Resp: (!) 22 (!) 21  18  Temp:      TempSrc:      SpO2: 97% 98%  98%  Weight:   82.1 kg (181 lb)   Height:   5\' 3"  (1.6 m)    Eyes: PERRL, lids and conjunctivae normal ENMT: Mucous membranes are moist. Posterior pharynx clear of any exudate or lesions.Normal dentition.  Neck: normal, supple, no masses, no thyromegaly Respiratory: clear to auscultation bilaterally, no wheezing, no crackles. Normal respiratory effort. No accessory muscle use.  Cardiovascular: Bradycardic, no murmurs / rubs / gallops. No extremity edema. 2+ pedal pulses. No carotid bruits.  Abdomen: no tenderness, no masses palpated. No hepatosplenomegaly. Bowel sounds positive.  Musculoskeletal: no clubbing / cyanosis. No joint deformity upper and lower extremities. Good ROM, no  contractures. Normal muscle tone.  Skin: no rashes, lesions, ulcers. No induration Neurologic: CN 2-12 grossly intact. Sensation intact, DTR normal. Strength 5/5 in all 4.  Psychiatric: Normal judgment and insight. Alert and oriented x 3. Normal mood.     Labs on Admission: I have personally reviewed following labs and imaging studies  CBC:  Recent Labs Lab 03/18/17 2304 03/20/17 0850 03/22/17 0543 03/23/17 0213  WBC 10.6* 7.2 7.7 8.3  NEUTROABS 3.6 3.5 3.4 3.7  HGB 9.7* 9.4* 9.5* 10.2*  HCT 27.9* 26.6* 27.2* 28.5*  MCV 82.1 82.1 82.4 81.4  PLT 619* 605* 515* 573*   Basic Metabolic Panel:  Recent Labs Lab 03/18/17 2304 03/20/17 0635 03/22/17 0543 03/23/17 0213  NA 142 138 138 137  K 3.7 5.0 3.7 3.6  CL 109 105 106 105  CO2 25 25 25 24   GLUCOSE 91 98 106* 97  BUN 11 7 7 10   CREATININE 0.56 0.67 0.50 0.63  CALCIUM 9.3 9.3 9.2 9.5   GFR: Estimated Creatinine Clearance: 110.1 mL/min (by C-G formula based on SCr of 0.63 mg/dL). Liver Function Tests:  Recent Labs Lab 03/18/17 2304 03/20/17 0635 03/22/17 0543 03/23/17 0213  AST 22 46* 26 26  ALT 15 24 21 22   ALKPHOS 71 60 57 66  BILITOT 1.0 1.2 1.3* 1.9*  PROT 7.6 7.7 7.5 8.6*  ALBUMIN 4.0 3.9 3.9 4.6   No results for input(s): LIPASE, AMYLASE in the last 168 hours. No results for input(s): AMMONIA in the last 168 hours. Coagulation Profile: No results for input(s): INR, PROTIME in the last 168 hours. Cardiac Enzymes: No results for input(s): CKTOTAL, CKMB, CKMBINDEX, TROPONINI in the last 168 hours. BNP (last 3 results) No results for input(s): PROBNP in the last 8760 hours. HbA1C: No results for input(s): HGBA1C in the last 72 hours. CBG: No results for input(s): GLUCAP in the last 168 hours. Lipid Profile: No results for input(s): CHOL, HDL, LDLCALC, TRIG, CHOLHDL, LDLDIRECT in the last 72 hours. Thyroid Function Tests: No results for input(s): TSH, T4TOTAL, FREET4, T3FREE, THYROIDAB in the last 72  hours. Anemia Panel:  Recent Labs  03/22/17 0543 03/23/17 0213  RETICCTPCT 8.8* 7.7*   Urine analysis:    Component Value Date/Time   COLORURINE YELLOW 10/16/2016 2329   APPEARANCEUR CLOUDY (A) 10/16/2016 2329   LABSPEC 1.010 10/16/2016 2329   PHURINE 6.0 10/16/2016 2329   GLUCOSEU NEGATIVE 10/16/2016 2329   HGBUR LARGE (A) 10/16/2016 2329   BILIRUBINUR NEGATIVE 10/16/2016 2329   KETONESUR NEGATIVE 10/16/2016 2329   PROTEINUR 100 (A) 10/16/2016 2329   UROBILINOGEN 1.0 09/12/2015 1548   NITRITE NEGATIVE 10/16/2016 2329   LEUKOCYTESUR LARGE (A) 10/16/2016 2329   Sepsis Labs: No results found for this or any previous visit (from the past 240 hour(s)).   Radiological Exams on Admission: Dg Chest 2 View  Result  Date: 03/23/2017 CLINICAL DATA:  Acute onset of shortness of breath. Sickle cell pain crisis. Initial encounter. EXAM: CHEST  2 VIEW COMPARISON:  Chest radiograph performed 02/10/2017 FINDINGS: The lungs are well-aerated and clear. There is no evidence of focal opacification, pleural effusion or pneumothorax. The heart is normal in size; the mediastinal contour is within normal limits. No acute osseous abnormalities are seen. Clips are noted within the right upper quadrant, reflecting prior cholecystectomy. IMPRESSION: No acute cardiopulmonary process seen. Electronically Signed   By: Garald Balding M.D.   On: 03/23/2017 03:40      Assessment/Plan Sickle cell pain crisis: Acute. Patient presents with complaint of lower back, legs,  and now upper arms. - Admit to a telemetry bed - Sickle cell pain crisis initiated  - Toradol IV q6hr  - D5 1/2 NS IV fluids 100 l/hr - PCA Dilaudid pump per protocol  Sickle cell anemia: Hemoglobin appears stable at 10.7 - Continue to monitor   DVT prophylaxis: lovenox   Code Status: Full Family Communication: No family present at bedside Disposition Plan:  Likely discharge home in 1-2 days. Consults called: None Admission status:  Observation  Norval Morton MD [facility] Pager [phone]  If 7PM-7AM, please contact night-coverage [email or web address] Password [email or web address]  03/23/2017, 5:50 AM

## 2017-03-23 NOTE — Care Management Note (Signed)
Case Management Note  Patient Details  Name: Carmen [name] MRN: [id number] Date of Birth: [birth year]  Subjective/Objective:                  26 y.o. female with medical history significant of sickle cell anemia  Action/Plan: Date:  Mar 23, 2017 Chart reviewed for concurrent status and case management needs. Will continue to follow patient progress. Discharge Planning: following for needs Expected discharge date: [id number] Velva Harman, BSN, [place], [place]  Expected Discharge Date:                  Expected Discharge Plan:  Home/Self Care  In-House Referral:     Discharge planning Services  CM Consult  Post Acute Care Choice:    Choice offered to:     DME Arranged:    DME Agency:     HH Arranged:    HH Agency:     Status of Service:  In process, will continue to follow  If discussed at Long Length of Stay Meetings, dates discussed:    Additional Comments:  Leeroy Cha, RN 03/23/2017, 8:49 AM

## 2017-03-23 NOTE — Progress Notes (Signed)
Pt requested for sitter to be removed from room. Informed patient that she has a doctor's order for a sitter since she made comments earlier in the day about wanting to die when she was hysterical on the phone. Pt insists she did not make any comments like that and wanted sitter removed. Informed patient that the doctor could be notified, and she could speak with charge nurse, but doctors orders couldn't be disregarded since they are in place for safety reasons.

## 2017-03-23 NOTE — ED Triage Notes (Signed)
Pt reports worsening generalized body pain r/t sickle cell pain. Pt reports taking x1 10mg  Percocet, x2 800mg  Ibuprofen and x1 60MG  Morphine tablet w/o relief. Pt denies dyspnea and cp.

## 2017-03-23 NOTE — Progress Notes (Signed)
Patient was admitted this am with sickle cell painful crisis and Depression. She reportedly had an argument with her boyfriend this afternoon and threatened suicide. She is now on Suicide precaution. I have also changed her dilaudid PCA to Opiate tolerant PCA.

## 2017-03-23 NOTE — Progress Notes (Addendum)
Patient very upset with nursing staff due to suicide precautions. According to day shift charge RN that patient became very hysterical and upset talking to her fiance on the phone. Staff overheard her wanting to "kill herself" and initiated suicide precautions and placed a 1:1 sitter at the bedside. At shift change patient became upset with primary RN and wanted to speak to charge RN. This RN explained to patient that the previous staff had to follow proper protocol and that I would follow up with the nursing supervisor and physician on call. Will continue to monitor.    Patient now states that she wants to leave AMA.

## 2017-03-23 NOTE — Progress Notes (Signed)
Peripherally Inserted Central Catheter/Midline Placement  The IV Nurse has discussed with the patient and/or persons authorized to consent for the patient, the purpose of this procedure and the potential benefits and risks involved with this procedure.  The benefits include less needle sticks, lab draws from the catheter, and the patient may be discharged home with the catheter. Risks include, but not limited to, infection, bleeding, blood clot (thrombus formation), and puncture of an artery; nerve damage and irregular heartbeat and possibility to perform a PICC exchange if needed/ordered by physician.  Alternatives to this procedure were also discussed.  Bard Power PICC patient education guide, fact sheet on infection prevention and patient information card has been provided to patient /or left at bedside.    PICC/Midline Placement Documentation  PICC Single Lumen 03/23/17 Left Brachial 36 cm 0 cm (Active)  Indication for Insertion or Continuance of Line Limited venous access - need for IV therapy >5 days (PICC only) 03/23/2017 12:00 PM  Exposed Catheter (cm) 0 cm 03/23/2017 12:00 PM  Dressing Change Due 03/30/17 03/23/2017 12:00 PM    [name] Claimed she had multiple picc done before   Carmen [name] 03/23/2017, 12:36 PM

## 2017-03-24 DIAGNOSIS — G894 Chronic pain syndrome: Secondary | ICD-10-CM | POA: Diagnosis present

## 2017-03-24 DIAGNOSIS — F609 Personality disorder, unspecified: Secondary | ICD-10-CM | POA: Diagnosis not present

## 2017-03-24 DIAGNOSIS — Z79899 Other long term (current) drug therapy: Secondary | ICD-10-CM | POA: Diagnosis not present

## 2017-03-24 DIAGNOSIS — D5700 Hb-SS disease with crisis, unspecified: Secondary | ICD-10-CM | POA: Diagnosis present

## 2017-03-24 DIAGNOSIS — F6089 Other specific personality disorders: Secondary | ICD-10-CM | POA: Diagnosis present

## 2017-03-24 DIAGNOSIS — D638 Anemia in other chronic diseases classified elsewhere: Secondary | ICD-10-CM | POA: Diagnosis present

## 2017-03-24 LAB — CBC
HCT: 23.9 % — ABNORMAL LOW (ref 36.0–46.0)
Hemoglobin: 8.7 g/dL — ABNORMAL LOW (ref 12.0–15.0)
MCH: 29.5 pg (ref 26.0–34.0)
MCHC: 36.4 g/dL — ABNORMAL HIGH (ref 30.0–36.0)
MCV: 81 fL (ref 78.0–100.0)
PLATELETS: 401 10*3/uL — AB (ref 150–400)
RBC: 2.95 MIL/uL — AB (ref 3.87–5.11)
RDW: 16 % — ABNORMAL HIGH (ref 11.5–15.5)
WBC: 10.4 10*3/uL (ref 4.0–10.5)

## 2017-03-24 LAB — HIGH SENSITIVITY CRP: CRP HIGH SENSITIVITY: 4.42 mg/L — AB (ref 0.00–3.00)
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
## 2017-03-24 NOTE — Progress Notes (Signed)
Pt reports she vomited in BR. Medicated w/ Zofran as ordered. Assessment unchanged from AM assessment. Pt resting in bed at present, watching TV. Used IS to 2000.

## 2017-03-24 NOTE — Progress Notes (Signed)
Report from [place], Therapist, sports.  Care assumed for pt at this time. Pt presently in BR. Will round back on pt.

## 2017-03-25 DIAGNOSIS — D638 Anemia in other chronic diseases classified elsewhere: Secondary | ICD-10-CM

## 2017-03-25 DIAGNOSIS — G894 Chronic pain syndrome: Secondary | ICD-10-CM

## 2017-03-25 MED ORDER — ZOLPIDEM TARTRATE 5 MG PO TABS: 5.0000 mg | ORAL_TABLET | Freq: Every evening | ORAL | Status: DC | PRN

## 2017-03-25 NOTE — Progress Notes (Signed)
16.5 Dilaudid PCA waste witnessed by [employer].

## 2017-03-25 NOTE — Discharge Summary (Signed)
Carmen [name] MRN: [id number] DOB/AGE: 05/09/1991 26 y.o.  Admit date: 03/23/2017 Discharge date: 03/25/2017  Primary Care Physician:  [name], MD   Discharge Diagnoses:   Patient Active Problem List   Diagnosis Date Noted  . Sickle cell pain crisis ([place]) 03/19/2017  . Sickle cell anemia ([place]) 08/15/2016  . Breast lump 08/15/2016  . Anemia of chronic disease   . 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 S/P laparoscopic cholecystectomy 11/30/2014  . Hypokalemia 11/28/2014  . Leukocytosis 11/22/2014  . Thrombocytosis ([place]) 11/22/2014    DISCHARGE MEDICATION:  Medications not reconciled as patient left AGAINST MEDICAL ADVICE.   Consults:    SIGNIFICANT DIAGNOSTIC STUDIES:  Dg Chest 2 View  Result Date: 03/23/2017 CLINICAL DATA:  Acute onset of shortness of breath. Sickle cell pain crisis. Initial encounter. EXAM: CHEST  2 VIEW COMPARISON:  Chest radiograph performed 02/10/2017 FINDINGS: The lungs are well-aerated and clear. There is no evidence of focal opacification, pleural effusion or pneumothorax. The heart is normal in size; the mediastinal contour is within normal limits. No acute osseous abnormalities are seen. Clips are noted within the right upper quadrant, reflecting prior cholecystectomy. IMPRESSION: No acute cardiopulmonary process seen. Electronically Signed   By: Garald Balding M.D.   On: 03/23/2017 03:40       No results found for this or any previous visit (from the past 240 hour(s)).  BRIEF ADMITTING H & P: Carmen [name] is a 26 y.o. female with medical history significant of sickle cell anemia; who presents with complaints of back pain. Symptoms have been progressively worsening over the last 1 week. She reports that initially started in her back and lower legs, but it has spread into her upper arms as well. She tried utilizing home medications of Percocet and morphine without relief of symptoms. Patient reports that she's  been back and forth to the emergency department at least 3 times this week trying to avoid being admitted. Pain symptoms would be temporary improved, but when patient would go home with be awoken out of her sleep in unbearable pain she was not able to control. Associated symptoms include mild shortness of breath. Denies any joint swelling, fever, chills, dysuria, urinary frequency, abdominal pain, nausea, vomiting, or diarrhea.  ED Course: Upon admission patient was noted to have hemoglobin of 10.7 and all other labs appear near patient's baseline. Chest x-ray showed no acute abnormalities. Patient pain was not relieved despite multiple rounds of IV Dilaudid. TRH called to admit for pain control.   Hospital Course:  Present on Admission: . Sickle cell pain crisis ([place]) . Sickle cell anemia (HCC)  Pt was admitted with sickle cell crisis and was treated for pain with Dilaudid via PCA, Toradol and IV. She also was initially placed under suicide precautions due to an overheard comment that was interpreted as suicidal ideations. However on evaluation she was neither suicidal or homicidal and precautions were discontinued. She was in the course of treatment and admitted that her pain was poorly controlled but stated that she had a family emergency and had to leave. Pt left AGAINST MEDICAL ADVICE.   Disposition and Follow-up: Pt left AGAINST MEDICAL ADVICE   DISCHARGE EXAM:  Not performed. Pt left AGAINST MEDICAL ADVICE  Blood pressure 113/63, pulse 63, temperature 98.4 F (36.9 C), temperature source Oral, resp. rate 11, height 5\' 3"  (1.6 m), weight 82.1 kg (181 lb), last menstrual period 03/13/2017, SpO2 97 %.   Recent Labs  03/23/17 0213  NA 137  K 3.6  CL 105  CO2 24  GLUCOSE 97  BUN 10  CREATININE 0.63  CALCIUM 9.5    Recent Labs  03/23/17 0213  AST 26  ALT 22  ALKPHOS 66  BILITOT 1.9*  PROT 8.6*  ALBUMIN 4.6   No results for input(s): LIPASE, AMYLASE in the last 72  hours.  Recent Labs  03/23/17 0213 03/24/17 0457  WBC 8.3 10.4  NEUTROABS 3.7  --   HGB 10.2* 8.7*  HCT 28.5* 23.9*  MCV 81.4 81.0  PLT 549* 401*     Total time spent including face to face and decision making was less than 30 minutes  Signed: Ginelle Bays A. 03/25/2017, 6:03 PM

## 2017-03-25 NOTE — Progress Notes (Signed)
SICKLE CELL SERVICE PROGRESS NOTE  Carmen [name] [id number]:[id number] DOB: 1991/04/02 DOA: 03/23/2017 PCP: [name], MD  Assessment/Plan: Principal Problem:   Sickle cell pain crisis ([place]) Active Problems:   Sickle cell anemia (HCC)   Hb SS with crisis:Continue PCA at current dose, Toradol and MS Contin for chronic pain management. IVF decreased to [facility] as patient eating and drinking well.   Anemia of Chronic Disease: No clinical signs of unstable Hb since yesterday. Will check Hb tomorrow.   Difficulty sleeping: Notes from nurse indicate patient having difficulty sleeping. Will order Zolpidem on a PRN basis.   Chronic Pain Syndrome: Continue MS Contin 60 mg q 12 hours.  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 She continues to  deny any suicidal or homicidal ideations.    Code Status: Full Code Family Communication: N/A Disposition Plan: Not yet ready for discharge  [place].  Pager [phone]. If 7PM-7AM, please contact night-coverage.  03/25/2017, 10:04 AM  LOS: 1 day   Interim History: Pt reports pain as 8/10 and localized to arms and legs. She reports that pain in her low back is not much of an issue . She has used 33.6 mg of Dilaudid with 57/56:demands/deliveries in the last 24 hours.   Consultants:  None  Procedures:  None  Antibiotics:  None   Objective: Vitals:   03/24/17 2136 03/25/17 0121 03/25/17 0556 03/25/17 0735  BP: 112/90 124/86 116/72   Pulse: (!) 52 63 68   Resp: 13 12 14 12   Temp: 97.4 F (36.3 C) 98.6 F (37 C) 97.5 F (36.4 C)   TempSrc: Oral Oral Oral   SpO2: 100% 97% 96% 100%  Weight:      Height:       Weight change:   Intake/Output Summary (Last 24 hours) at 03/25/17 1004 Last data filed at 03/25/17 0300  Gross per 24 hour  Intake           3274.75 ml  Output                0 ml  Net          3274.75 ml      Physical Exam General: Alert, awake, oriented x3, in no acute distress.  HEENT: [place]/AT PEERL, EOMI, mild icterus  Heart: Regular rate and rhythm, without murmurs, rubs, gallops, PMI non-displaced, no heaves or thrills on palpation.  Lungs: Clear to auscultation, no wheezing or rhonchi noted. No increased vocal fremitus resonant to percussion  Abdomen: Soft, nontender, nondistended, positive bowel sounds, no masses no hepatosplenomegaly noted.  Neuro: No focal neurological deficits noted cranial nerves II through XII grossly intact.  Strength at baseline in bilateral upper and lower extremities. Musculoskeletal: No warmth swelling or erythema around joints, no spinal tenderness noted. Psychiatric: Patient alert and oriented x3, good insight and cognition, good recent to remote recall.    Data Reviewed: Basic Metabolic Panel:  Recent Labs Lab 03/18/17 2304 03/20/17 0635 03/22/17 0543 03/23/17 0213  NA 142 138 138 137  K 3.7 5.0 3.7 3.6  CL 109 105 106 105  CO2 25 25 25 24   GLUCOSE 91 98 106* 97  BUN 11 7 7 10   CREATININE 0.56 0.67 0.50 0.63  CALCIUM 9.3 9.3 9.2 9.5   Liver Function Tests:  Recent Labs Lab 03/18/17 2304 03/20/17 0370 03/22/17 0543 03/23/17 4888  AST 22 46* 26 26  ALT 15 24 21 22   ALKPHOS 71 60 57 66  BILITOT 1.0 1.2 1.3* 1.9*  PROT 7.6 7.7 7.5 8.6*  ALBUMIN 4.0 3.9 3.9 4.6   No results for input(s): LIPASE, AMYLASE in the last 168 hours. No results for input(s): AMMONIA in the last 168 hours. CBC:  Recent Labs Lab 03/18/17 2304 03/20/17 0850 03/22/17 0543 03/23/17 0213 03/24/17 0457  WBC 10.6* 7.2 7.7 8.3 10.4  NEUTROABS 3.6 3.5 3.4 3.7  --   HGB 9.7* 9.4* 9.5* 10.2* 8.7*  HCT 27.9* 26.6* 27.2* 28.5* 23.9*  MCV 82.1 82.1 82.4 81.4 81.0  PLT 619* 605* 515* 549* 401*   Cardiac Enzymes: No results for input(s): CKTOTAL, CKMB, CKMBINDEX, TROPONINI in the last 168  hours. BNP (last 3 results) No results for input(s): BNP in the last 8760 hours.  ProBNP (last 3 results) No results for input(s): PROBNP in the last 8760 hours.  CBG: No results for input(s): GLUCAP in the last 168 hours.  No results found for this or any previous visit (from the past 240 hour(s)).   Studies: Dg Chest 2 View  Result Date: 03/23/2017 CLINICAL DATA:  Acute onset of shortness of breath. Sickle cell pain crisis. Initial encounter. EXAM: CHEST  2 VIEW COMPARISON:  Chest radiograph performed 02/10/2017 FINDINGS: The lungs are well-aerated and clear. There is no evidence of focal opacification, pleural effusion or pneumothorax. The heart is normal in size; the mediastinal contour is within normal limits. No acute osseous abnormalities are seen. Clips are noted within the right upper quadrant, reflecting prior cholecystectomy. IMPRESSION: No acute cardiopulmonary process seen. Electronically Signed   By: Garald Balding M.D.   On: 03/23/2017 03:40    Scheduled Meds: . enoxaparin (LOVENOX) injection  40 mg Subcutaneous Q24H  . HYDROmorphone   Intravenous Q4H  . ketorolac  30 mg Intravenous Q6H  . morphine  60 mg Oral Q12H  . senna-docusate  1 tablet Oral BID   Continuous Infusions: . dextrose 5 % and 0.45% NaCl 10 mL/hr at 03/25/17 7989    Principal Problem:   Sickle cell pain crisis [facility]) Active Problems:   Sickle cell anemia (HCC)    In excess of 25 minutes spent during this visit. Greater than 50% involved face to face contact with the patient for assessment, counseling and coordination of care.

## 2017-03-25 NOTE — [facility] Notes (Signed)
Patient has elected to leave against medical advice, stating that she has a family emergency.  Dr. Zigmund Daniel made aware of patient's wishes.  Patient counseled, and is aware of all the risks involved with leaving early.  Patient has signed paperwork, and Picc line has been removed.

## 2017-03-25 NOTE — Progress Notes (Signed)
Pt hasn't slept all night. Pt waited until 5AM to ask for a sleep aid, specifically Ambien. This RN informed the patient that it was not ordered on [facility] and thus we would have to get an order for it, which was denied by the floor coverage MD. This RN instructed the patient to inform Dr. Zigmund Daniel about insomnia when she rounds today.

## 2017-03-26 ENCOUNTER — Emergency Department (HOSPITAL_COMMUNITY)
Admission: EM | Admit: 2017-03-26 | Discharge: 2017-03-26 | Disposition: A | Discharge status: Home / Self Care | Payer: Medicaid Other | Attending: Emergency Medicine | Admitting: Emergency Medicine

## 2017-03-26 ENCOUNTER — Encounter (HOSPITAL_COMMUNITY): Payer: Self-pay

## 2017-03-26 DIAGNOSIS — D57419 Sickle-cell thalassemia with crisis, unspecified: Secondary | ICD-10-CM | POA: Insufficient documentation

## 2017-03-26 DIAGNOSIS — Z79899 Other long term (current) drug therapy: Secondary | ICD-10-CM | POA: Insufficient documentation

## 2017-03-26 DIAGNOSIS — D5700 Hb-SS disease with crisis, unspecified: Secondary | ICD-10-CM

## 2017-03-26 LAB — COMPREHENSIVE METABOLIC PANEL
ALT: 53 U/L (ref 14–54)
AST: 40 U/L (ref 15–41)
Albumin: 4.2 g/dL (ref 3.5–5.0)
Alkaline Phosphatase: 60 U/L (ref 38–126)
Anion gap: 9 (ref 5–15)
BILIRUBIN TOTAL: 3.3 mg/dL — AB (ref 0.3–1.2)
BUN: 9 mg/dL (ref 6–20)
CALCIUM: 9.5 mg/dL (ref 8.9–10.3)
CO2: 26 mmol/L (ref 22–32)
CREATININE: 0.51 mg/dL (ref 0.44–1.00)
Chloride: 104 mmol/L (ref 101–111)
Glucose, Bld: 96 mg/dL (ref 65–99)
Potassium: 3.6 mmol/L (ref 3.5–5.1)
Sodium: 139 mmol/L (ref 135–145)
Total Protein: 7.9 g/dL (ref 6.5–8.1)

## 2017-03-26 LAB — RETICULOCYTES
RBC.: 3.39 MIL/uL — ABNORMAL LOW (ref 3.87–5.11)
RETIC COUNT ABSOLUTE: 257.6 10*3/uL — AB (ref 19.0–186.0)
Retic Ct Pct: 7.6 % — ABNORMAL HIGH (ref 0.4–3.1)

## 2017-03-26 LAB — CBC WITH DIFFERENTIAL/PLATELET
BASOS ABS: 0 10*3/uL (ref 0.0–0.1)
Basophils Relative: 0 %
Eosinophils Absolute: 0 10*3/uL (ref 0.0–0.7)
Eosinophils Relative: 0 %
HCT: 27.3 % — ABNORMAL LOW (ref 36.0–46.0)
Hemoglobin: 9.9 g/dL — ABNORMAL LOW (ref 12.0–15.0)
LYMPHS PCT: 25 %
Lymphs Abs: 3 10*3/uL (ref 0.7–4.0)
MCH: 29.2 pg (ref 26.0–34.0)
MCHC: 36.3 g/dL — ABNORMAL HIGH (ref 30.0–36.0)
MCV: 80.5 fL (ref 78.0–100.0)
Monocytes Absolute: 1.9 10*3/uL — ABNORMAL HIGH (ref 0.1–1.0)
Monocytes Relative: 16 %
Neutro Abs: 7.1 10*3/uL (ref 1.7–7.7)
Neutrophils Relative %: 59 %
Platelets: 432 10*3/uL — ABNORMAL HIGH (ref 150–400)
RBC: 3.39 MIL/uL — ABNORMAL LOW (ref 3.87–5.11)
RDW: 16.6 % — AB (ref 11.5–15.5)
WBC: 12.1 10*3/uL — ABNORMAL HIGH (ref 4.0–10.5)

## 2017-03-26 MED ORDER — HYDROMORPHONE HCL 1 MG/ML IJ SOLN
2.0000 mg | INTRAMUSCULAR | Status: AC
  Administered 2017-03-26: 2 mg via INTRAVENOUS
  Filled 2017-03-26: qty 2

## 2017-03-26 MED ORDER — HYDROMORPHONE HCL 1 MG/ML IJ SOLN: 2.0000 mg | INTRAMUSCULAR | Status: AC

## 2017-03-26 MED ORDER — METHOCARBAMOL 1000 MG/10ML IJ SOLN: 500.0000 mg | Freq: Once | INTRAMUSCULAR | Status: DC

## 2017-03-26 MED ORDER — ONDANSETRON HCL 4 MG/2ML IJ SOLN: 4.0000 mg | INTRAMUSCULAR | Status: DC | PRN

## 2017-03-26 MED ORDER — HYDROMORPHONE HCL 1 MG/ML IJ SOLN: 2.0000 mg | INTRAMUSCULAR | Status: DC

## 2017-03-26 MED ORDER — DIPHENHYDRAMINE HCL 50 MG/ML IJ SOLN
25.0000 mg | Freq: Once | INTRAMUSCULAR | Status: AC
  Administered 2017-03-26: 25 mg via INTRAVENOUS
  Filled 2017-03-26: qty 1

## 2017-03-26 MED ORDER — KETOROLAC TROMETHAMINE 30 MG/ML IJ SOLN
30.0000 mg | INTRAMUSCULAR | Status: AC
Start: 2017-03-26 — End: 2017-03-26
  Administered 2017-03-26: 30 mg via INTRAVENOUS
  Filled 2017-03-26: qty 1

## 2017-03-26 MED ORDER — METHOCARBAMOL 1000 MG/10ML IJ SOLN
1000.0000 mg | Freq: Once | INTRAMUSCULAR | Status: DC
  Filled 2017-03-26: qty 10

## 2017-03-26 MED ORDER — DEXTROSE-NACL 5-0.45 % IV SOLN
INTRAVENOUS | Status: DC
  Administered 2017-03-26: 18:00:00 via INTRAVENOUS

## 2017-03-26 MED ORDER — METHOCARBAMOL 1000 MG/10ML IJ SOLN
500.0000 mg | Freq: Once | INTRAVENOUS | Status: AC
  Administered 2017-03-26: 500 mg via INTRAVENOUS
  Filled 2017-03-26: qty 550
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
## 2017-03-26 NOTE — ED Notes (Signed)
Pt was advised that EDP is not going to order 4th dose of Dilaudid 2 mg--- and was also advised that if pain is not controlled, she needed to get admitted for further pain management.  Pt insisted she does not want to get admitted, got upset and requested to take her IV out; pt stated, "I'll just go somewhere else like in [place] or in [place]".

## 2017-03-26 NOTE — ED Notes (Signed)
Bed: [facility] Expected date:  Expected time:  Means of arrival:  Comments: 

## 2017-03-26 NOTE — ED Notes (Signed)
Pt did not want to to talk to EDP when offered; pt stated, "I'll just leave".

## 2017-03-27 ENCOUNTER — Emergency Department (HOSPITAL_COMMUNITY)
Admission: EM | Admit: 2017-03-27 | Discharge: 2017-03-27 | Disposition: A | Discharge status: Home / Self Care | Payer: Medicaid Other | Attending: Emergency Medicine | Admitting: Emergency Medicine

## 2017-03-27 ENCOUNTER — Encounter (HOSPITAL_COMMUNITY): Payer: Self-pay | Admitting: Emergency Medicine

## 2017-03-27 DIAGNOSIS — D57219 Sickle-cell/Hb-C disease with crisis, unspecified: Secondary | ICD-10-CM | POA: Insufficient documentation

## 2017-03-27 DIAGNOSIS — D5700 Hb-SS disease with crisis, unspecified: Secondary | ICD-10-CM

## 2017-03-27 LAB — COMPREHENSIVE METABOLIC PANEL
ALT: 44 U/L (ref 14–54)
AST: 29 U/L (ref 15–41)
Albumin: 4.1 g/dL (ref 3.5–5.0)
Alkaline Phosphatase: 53 U/L (ref 38–126)
Anion gap: 9 (ref 5–15)
BUN: 13 mg/dL (ref 6–20)
CHLORIDE: 105 mmol/L (ref 101–111)
CO2: 25 mmol/L (ref 22–32)
CREATININE: 0.59 mg/dL (ref 0.44–1.00)
Calcium: 9.6 mg/dL (ref 8.9–10.3)
GFR calc Af Amer: >60 mL/min (ref >60)
GFR calc non Af Amer: >60 mL/min (ref >60)
Glucose, Bld: 103 mg/dL — ABNORMAL HIGH (ref 65–99)
POTASSIUM: 4 mmol/L (ref 3.5–5.1)
SODIUM: 139 mmol/L (ref 135–145)
Total Bilirubin: 2.7 mg/dL — ABNORMAL HIGH (ref 0.3–1.2)
Total Protein: 7.8 g/dL (ref 6.5–8.1)

## 2017-03-27 LAB — CBC WITH DIFFERENTIAL/PLATELET
BASOS ABS: 0 10*3/uL (ref 0.0–0.1)
Basophils Relative: 0 %
Eosinophils Absolute: 0.3 10*3/uL (ref 0.0–0.7)
Eosinophils Relative: 2 %
HCT: 26.9 % — ABNORMAL LOW (ref 36.0–46.0)
Hemoglobin: 9.8 g/dL — ABNORMAL LOW (ref 12.0–15.0)
LYMPHS PCT: 15 %
Lymphs Abs: 2.1 10*3/uL (ref 0.7–4.0)
MCH: 29.4 pg (ref 26.0–34.0)
MCHC: 36.4 g/dL — ABNORMAL HIGH (ref 30.0–36.0)
MCV: 80.8 fL (ref 78.0–100.0)
MONO ABS: 1.5 10*3/uL — AB (ref 0.1–1.0)
Monocytes Relative: 11 %
Neutro Abs: 10 10*3/uL — ABNORMAL HIGH (ref 1.7–7.7)
Neutrophils Relative %: 72 %
PLATELETS: 443 10*3/uL — AB (ref 150–400)
RBC: 3.33 MIL/uL — AB (ref 3.87–5.11)
RDW: 16.1 % — AB (ref 11.5–15.5)
WBC: 13.9 10*3/uL — AB (ref 4.0–10.5)

## 2017-03-27 LAB — RETICULOCYTES
RBC.: 3.33 MIL/uL — AB (ref 3.87–5.11)
RETIC CT PCT: 7.1 % — AB (ref 0.4–3.1)
Retic Count, Absolute: 236.4 10*3/uL — ABNORMAL HIGH (ref 19.0–186.0)

## 2017-03-27 LAB — TROPONIN I

## 2017-03-27 MED ORDER — ONDANSETRON HCL 4 MG/2ML IJ SOLN
4.0000 mg | Freq: Once | INTRAMUSCULAR | Status: AC
  Administered 2017-03-27: 4 mg via INTRAVENOUS
  Filled 2017-03-27: qty 2

## 2017-03-27 MED ORDER — HYDROMORPHONE HCL 1 MG/ML IJ SOLN
2.0000 mg | Freq: Once | INTRAMUSCULAR | Status: AC
  Administered 2017-03-27: 2 mg via INTRAVENOUS
  Filled 2017-03-27: qty 2

## 2017-03-27 MED ORDER — HYDROMORPHONE HCL 1 MG/ML IJ SOLN
2.0000 mg | Freq: Once | INTRAMUSCULAR | Status: AC
  Administered 2017-03-27: 2 mg via INTRAMUSCULAR
  Filled 2017-03-27: qty 2

## 2017-03-27 MED ORDER — SODIUM CHLORIDE 0.9 % IV BOLUS (SEPSIS)
1000.0000 mL | Freq: Once | INTRAVENOUS | Status: AC
  Administered 2017-03-27: 1000 mL via INTRAVENOUS

## 2017-03-27 NOTE — ED Provider Notes (Signed)
[facility] DEPT Provider Note   CSN: [id number] Arrival date & time: 03/27/17  1191     History   Chief Complaint Chief Complaint  Patient presents with  . Sickle Cell Pain Crisis    HPI Carmen [name] is a 26 y.o. female.  Patient complains of recurrent tumor sickle cell pain. Patient hurts in her back and arms this is typical for her   The history is provided by the patient. No language interpreter was used.  Sickle Cell Pain Crisis  Location:  Back Severity:  Severe Onset quality:  Gradual Similar to previous crisis episodes: yes   Timing:  Constant Progression:  Waxing and waning Chronicity:  Recurrent Associated symptoms: no chest pain, no congestion, no cough, no fatigue and no headaches     Past Medical History:  Diagnosis Date  . Abortion    05/2012  . Headache(784.0)   . Sickle cell crisis [facility])     Patient Active Problem List   Diagnosis Date Noted  . Sickle cell pain crisis ([place]) 03/19/2017  . Sickle cell anemia ([place]) 08/15/2016  . Breast lump 08/15/2016  . Anemia of chronic disease   . 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 S/P laparoscopic cholecystectomy 11/30/2014  . Hypokalemia 11/28/2014  . Leukocytosis 11/22/2014  . Thrombocytosis ([place]) 11/22/2014    Past Surgical History:  Procedure Laterality Date  . CHOLECYSTECTOMY N/A 11/30/2014   Procedure: LAPAROSCOPIC CHOLECYSTECTOMY SINGLE SITE WITH INTRAOPERATIVE CHOLANGIOGRAM;  Surgeon: Michael Boston, MD;  Location: [facility];  Service: General;  Laterality: N/A;  . SPLENECTOMY      OB History    Gravida Para Term Preterm AB Living   1       1     SAB TAB Ectopic Multiple Live Births                   Home Medications    Prior to Admission medications   Medication Sig Start Date End Date Taking? Authorizing Provider  morphine (MS CONTIN) 60 MG 12 hr tablet Take 60 mg by mouth 2 (two) times daily as needed for pain.    Yes [provider]    oxyCODONE-acetaminophen (PERCOCET) 10-325 MG tablet Take 1 tablet by mouth every 4 (four) hours as needed for pain.   Yes [provider]    Family History Family History  Problem Relation Age of Onset  . Hypertension Mother   . Sickle cell anemia Sister   . Kidney disease Sister        Lupus  . Arthritis Sister   . Sickle cell anemia Sister   . Sickle cell trait Sister   . Heart disease Maternal Aunt        CABG  . Heart disease Maternal Uncle        CABG  . Lupus Sister     Social History Social History  Substance Use Topics  . Smoking status: Never Smoker  . Smokeless tobacco: Never Used  . Alcohol use No     Allergies   Patient has no known allergies.   Review of Systems Review of Systems  Constitutional: Negative for appetite change and fatigue.  HENT: Negative for congestion, ear discharge and sinus pressure.   Eyes: Negative for discharge.  Respiratory: Negative for cough.   Cardiovascular: Negative for chest pain.  Gastrointestinal: Negative for abdominal pain and diarrhea.  Genitourinary: Negative for frequency and hematuria.  Musculoskeletal: Positive for back pain.  Skin:  Negative for rash.  Neurological: Negative for seizures and headaches.  Psychiatric/Behavioral: Negative for hallucinations.     Physical Exam Updated Vital Signs BP 133/83 Comment: Simultaneous filing. User may not have seen previous data.  Pulse 71 Comment: Simultaneous filing. User may not have seen previous data.  Resp (!) 22   LMP 03/13/2017   SpO2 96% Comment: Simultaneous filing. User may not have seen previous data.  Physical Exam  Constitutional: She is oriented to person, place, and time. She appears well-developed.  HENT:  Head: Normocephalic.  Eyes: Conjunctivae and EOM are normal. No scleral icterus.  Neck: Neck supple. No thyromegaly present.  Cardiovascular: Normal rate and regular rhythm.  Exam reveals no gallop and no friction rub.   No murmur  heard. Pulmonary/Chest: No stridor. She has no wheezes. She has no rales. She exhibits no tenderness.  Abdominal: She exhibits no distension. There is no tenderness. There is no rebound.  Musculoskeletal: Normal range of motion. She exhibits no edema.  Lymphadenopathy:    She has no cervical adenopathy.  Neurological: She is oriented to person, place, and time. She exhibits normal muscle tone. Coordination normal.  Skin: No rash noted. No erythema.  Psychiatric: She has a normal mood and affect. Her behavior is normal.     ED Treatments / Results  Labs (all labs ordered are listed, but only abnormal results are displayed) Labs Reviewed  CBC WITH DIFFERENTIAL/PLATELET - Abnormal; Notable for the following:       Result Value   WBC 13.9 (*)    RBC 3.33 (*)    Hemoglobin 9.8 (*)    HCT 26.9 (*)    MCHC 36.4 (*)    RDW 16.1 (*)    Platelets 443 (*)    Neutro Abs 10.0 (*)    Monocytes Absolute 1.5 (*)    All other components within normal limits  COMPREHENSIVE METABOLIC PANEL - Abnormal; Notable for the following:    Glucose, Bld 103 (*)    Total Bilirubin 2.7 (*)    All other components within normal limits  RETICULOCYTES - Abnormal; Notable for the following:    Retic Ct Pct 7.1 (*)    RBC. 3.33 (*)    Retic Count, Manual 236.4 (*)    All other components within normal limits  TROPONIN I    EKG  EKG Interpretation None       Radiology No results found.  Procedures Procedures (including critical care time)  Medications Ordered in ED Medications  HYDROmorphone (DILAUDID) injection 2 mg (2 mg Intravenous Given 03/27/17 1010)  sodium chloride 0.9 % bolus 1,000 mL (0 mLs Intravenous Stopped 03/27/17 1325)  ondansetron (ZOFRAN) injection 4 mg (4 mg Intravenous Given 03/27/17 1010)  HYDROmorphone (DILAUDID) injection 2 mg (2 mg Intravenous Given 03/27/17 1119)  ondansetron (ZOFRAN) injection 4 mg (4 mg Intravenous Given 03/27/17 1119)  HYDROmorphone (DILAUDID) injection 2  mg (2 mg Intravenous Given 03/27/17 1318)  ondansetron (ZOFRAN) injection 4 mg (4 mg Intravenous Given 03/27/17 1318)  HYDROmorphone (DILAUDID) injection 2 mg (2 mg Intramuscular Given 03/27/17 1411)     Initial Impression / Assessment and Plan / ED Course  I have reviewed the triage vital signs and the nursing notes.  Pertinent labs & imaging results that were available during my care of the patient were reviewed by me and considered in my medical decision making (see chart for details).     Patient with a typical sickle cell pain. The pain improved significantly with 3 doses  of pain medicine. She will follow-up with her doctor as needed Final Clinical Impressions(s) / ED Diagnoses   Final diagnoses:  Sickle cell pain crisis [facility])    New Prescriptions New Prescriptions   No medications on file     Milton Ferguson, MD 03/27/17 1418

## 2017-03-27 NOTE — ED Triage Notes (Signed)
Pt c/o sickle cell pain in back and lower extremities. Pt is in no distress. Pt states she was seen 03/26/17 for sickle cell pain and pain remains the same.

## 2017-03-27 NOTE — ED Notes (Signed)
Pt reports that she does not think she received her pain meds d/t IV infiltration. Per Dr Roderic Palau, will give IM pain meds.

## 2017-03-27 NOTE — Discharge Instructions (Signed)
Follow up with your md this week. °

## 2017-03-28 ENCOUNTER — Encounter (HOSPITAL_COMMUNITY): Payer: Self-pay | Admitting: Emergency Medicine

## 2017-03-28 ENCOUNTER — Emergency Department (HOSPITAL_COMMUNITY)
Admission: EM | Admit: 2017-03-28 | Discharge: 2017-03-28 | Disposition: A | Discharge status: Home / Self Care | Payer: Medicaid Other | Attending: Emergency Medicine | Admitting: Emergency Medicine

## 2017-03-28 ENCOUNTER — Emergency Department (HOSPITAL_COMMUNITY): Payer: Medicaid Other

## 2017-03-28 DIAGNOSIS — Z79899 Other long term (current) drug therapy: Secondary | ICD-10-CM | POA: Diagnosis not present

## 2017-03-28 DIAGNOSIS — D5700 Hb-SS disease with crisis, unspecified: Secondary | ICD-10-CM | POA: Diagnosis not present

## 2017-03-28 MED ORDER — KETOROLAC TROMETHAMINE 30 MG/ML IJ SOLN
30.0000 mg | Freq: Once | INTRAMUSCULAR | Status: AC
  Administered 2017-03-28: 30 mg via INTRAVENOUS
  Filled 2017-03-28: qty 1

## 2017-03-28 MED ORDER — ONDANSETRON HCL 4 MG/2ML IJ SOLN
4.0000 mg | Freq: Once | INTRAMUSCULAR | Status: AC
  Administered 2017-03-28: 4 mg via INTRAVENOUS
  Filled 2017-03-28: qty 2

## 2017-03-28 MED ORDER — DIPHENHYDRAMINE HCL 25 MG PO CAPS
50.0000 mg | ORAL_CAPSULE | Freq: Once | ORAL | Status: AC
  Administered 2017-03-28: 50 mg via ORAL
  Filled 2017-03-28: qty 2

## 2017-03-28 MED ORDER — OXYCODONE HCL 5 MG PO TABS: 10.0000 mg | ORAL_TABLET | Freq: Once | ORAL | Status: DC

## 2017-03-28 MED ORDER — ONDANSETRON HCL 4 MG/2ML IJ SOLN: 4.0000 mg | Freq: Once | INTRAMUSCULAR | Status: DC

## 2017-03-28 MED ORDER — DEXTROSE 5 % IV BOLUS
1000.0000 mL | Freq: Once | INTRAVENOUS | Status: AC
  Administered 2017-03-28: 1000 mL via INTRAVENOUS

## 2017-03-28 MED ORDER — HYDROMORPHONE HCL 1 MG/ML IJ SOLN
2.0000 mg | Freq: Once | INTRAMUSCULAR | Status: AC
  Administered 2017-03-28: 2 mg via INTRAVENOUS
  Filled 2017-03-28: qty 2

## 2017-03-28 MED ORDER — ACETAMINOPHEN 500 MG PO TABS: 1000.0000 mg | ORAL_TABLET | Freq: Once | ORAL | Status: DC

## 2017-03-28 NOTE — ED Triage Notes (Addendum)
Pt report increase in recurrent joint pain due to sickle cell. Seen almost daily for last 10 days. Pt last medicated at 0030 this am. Pt reports  chest pressure,  shortness of breath

## 2017-03-28 NOTE — ED Notes (Signed)
Pt states that she is not nauseous and does not want nausea medicine.

## 2017-03-28 NOTE — ED Notes (Signed)
Patient request [place] IV lab draw.

## 2017-03-28 NOTE — ED Notes (Signed)
Pt refused oral meds and asked instead for IV.

## 2017-03-28 NOTE — ED Provider Notes (Signed)
[facility] DEPT Provider Note   CSN: [id number] Arrival date & time: 03/28/17  1134     History   Chief Complaint Chief Complaint  Patient presents with  . Sickle Cell Pain Crisis  . Back Pain    HPI Carmen [name] is a 26 y.o. female.  26 yo F with a chief complaint of sickle cell pain crisis. Going on for the past 10 days or so. Is been to the ED multiple times in the past week and a half. Patient denies fevers. Feels that her pain is getting worse. Is unsure cause of this. Thinks the combination of stress and changes in weather. Started having some chest pain today as well. Describes the chest pain is very mild. Does not feel like her prior acute chest. No new areas of tenderness.   The history is provided by the patient.  Sickle Cell Pain Crisis  Location:  Lower extremity, upper extremity, chest and back Severity:  Severe Onset quality:  Sudden Duration:  10 days Similar to previous crisis episodes: yes   Timing:  Constant Chronicity:  Chronic Context: low humidity   Context: not infection   Relieved by:  Nothing Worsened by:  Nothing Ineffective treatments:  None tried Associated symptoms: no chest pain, no congestion, no fever, no headaches, no nausea, no shortness of breath, no vomiting and no wheezing   Risk factors: frequent admissions for pain   Back Pain   Pertinent negatives include no chest pain, no fever, no headaches and no dysuria.    Past Medical History:  Diagnosis Date  . Abortion    05/2012  . Headache(784.0)   . Sickle cell crisis [facility])     Patient Active Problem List   Diagnosis Date Noted  . Sickle cell pain crisis ([place]) 03/19/2017  . Sickle cell anemia ([place]) 08/15/2016  . Breast lump 08/15/2016  . Anemia of chronic disease   . 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 S/P laparoscopic cholecystectomy 11/30/2014  . Hypokalemia 11/28/2014  . Leukocytosis 11/22/2014  . Thrombocytosis ([place]) 11/22/2014     Past Surgical History:  Procedure Laterality Date  . CHOLECYSTECTOMY N/A 11/30/2014   Procedure: LAPAROSCOPIC CHOLECYSTECTOMY SINGLE SITE WITH INTRAOPERATIVE CHOLANGIOGRAM;  Surgeon: Michael Boston, MD;  Location: [facility];  Service: General;  Laterality: N/A;  . SPLENECTOMY      OB History    Gravida Para Term Preterm AB Living   1       1     SAB TAB Ectopic Multiple Live Births                   Home Medications    Prior to Admission medications   Medication Sig Start Date End Date Taking? Authorizing Provider  morphine (MS CONTIN) 60 MG 12 hr tablet Take 60 mg by mouth 2 (two) times daily as needed for pain.    Yes [provider]  oxyCODONE-acetaminophen (PERCOCET) 10-325 MG tablet Take 1 tablet by mouth every 4 (four) hours as needed for pain.   Yes [provider]    Family History Family History  Problem Relation Age of Onset  . Hypertension Mother   . Sickle cell anemia Sister   . Kidney disease Sister        Lupus  . Arthritis Sister   . Sickle cell anemia Sister   . Sickle cell trait Sister   . Heart disease Maternal Aunt  CABG  . Heart disease Maternal Uncle        CABG  . Lupus Sister     Social History Social History  Substance Use Topics  . Smoking status: Never Smoker  . Smokeless tobacco: Never Used  . Alcohol use No     Allergies   Patient has no known allergies.   Review of Systems Review of Systems  Constitutional: Negative for chills and fever.  HENT: Negative for congestion and rhinorrhea.   Eyes: Negative for redness and visual disturbance.  Respiratory: Negative for shortness of breath and wheezing.   Cardiovascular: Negative for chest pain and palpitations.  Gastrointestinal: Negative for nausea and vomiting.  Genitourinary: Negative for dysuria and urgency.  Musculoskeletal: Positive for arthralgias, back pain and myalgias.  Skin: Negative for pallor and wound.  Neurological: Negative for dizziness and  headaches.     Physical Exam Updated Vital Signs BP 136/79 (BP Location: Right Arm)   Pulse 70   Temp 98.6 F (37 C) (Oral)   Resp 20   Ht 5\' 3"  (1.6 m)   Wt 82.1 kg (181 lb)   LMP 03/13/2017   SpO2 97%   BMI 32.06 kg/m   Physical Exam  Constitutional: She is oriented to person, place, and time. She appears well-developed and well-nourished. No distress.  HENT:  Head: Normocephalic and atraumatic.  Eyes: EOM are normal. Pupils are equal, round, and reactive to light.  Neck: Normal range of motion. Neck supple.  Cardiovascular: Normal rate and regular rhythm.  Exam reveals no gallop and no friction rub.   No murmur heard. Pulmonary/Chest: Effort normal. She has no wheezes. She has no rales.  Abdominal: Soft. She exhibits no distension and no mass. There is no tenderness. There is no guarding.  Musculoskeletal: She exhibits no edema or tenderness.  Neurological: She is alert and oriented to person, place, and time.  Skin: Skin is warm and dry. She is not diaphoretic.  Psychiatric: She has a normal mood and affect. Her behavior is normal.  Nursing note and vitals reviewed.    ED Treatments / Results  Labs (all labs ordered are listed, but only abnormal results are displayed) Labs Reviewed - No data to display  EKG  EKG Interpretation None       Radiology Dg Chest 2 View  Result Date: 03/28/2017 CLINICAL DATA:  Recurrent joint pain due to sickle cell disease almost daily for 10 days, chest pressure, shortness of breath EXAM: CHEST  2 VIEW COMPARISON:  03/23/2017 FINDINGS: Normal heart size, mediastinal contours, and pulmonary vascularity. Lungs clear. No pleural effusion or pneumothorax. No acute osseous findings. IMPRESSION: No acute abnormalities. Electronically Signed   By: Lavonia [name] M.D.   On: 03/28/2017 13:59    Procedures Procedures (including critical care time)  Medications Ordered in ED Medications  ondansetron (ZOFRAN) injection 4 mg (not  administered)  oxyCODONE (Oxy IR/ROXICODONE) immediate release tablet 10 mg (not administered)  acetaminophen (TYLENOL) tablet 1,000 mg (not administered)  HYDROmorphone (DILAUDID) injection 2 mg (2 mg Intravenous Given 03/28/17 1418)  diphenhydrAMINE (BENADRYL) capsule 50 mg (50 mg Oral Given 03/28/17 1417)  ondansetron (ZOFRAN) injection 4 mg (4 mg Intravenous Given 03/28/17 1418)  dextrose 5 % bolus 1,000 mL (1,000 mLs Intravenous New Bag/Given 03/28/17 1430)  HYDROmorphone (DILAUDID) injection 2 mg (2 mg Intravenous Given 03/28/17 1548)  ondansetron (ZOFRAN) injection 4 mg (4 mg Intravenous Given 03/28/17 1548)  HYDROmorphone (DILAUDID) injection 2 mg (2 mg Intravenous Given 03/28/17 1613)  ketorolac (  TORADOL) 30 MG/ML injection 30 mg (30 mg Intravenous Given 03/28/17 1613)     Initial Impression / Assessment and Plan / ED Course  I have reviewed the triage vital signs and the nursing notes.  Pertinent labs & imaging results that were available during my care of the patient were reviewed by me and considered in my medical decision making (see chart for details).     26 yo F With a chief complaint of sickle cell pain crisis. No new areas of pain. Afebrile. Labs were done the last 3 days and a relative not feel the need to be repeated at this time. Chest x-ray is negative.  The patient is feeling better after 3 doses. She has follow-up tomorrow as an outpatient.  4:50 PM:  I have discussed the diagnosis/risks/treatment options with the patient and believe the pt to be eligible for discharge home to follow-up with Heme/onc. We also discussed returning to the ED immediately if new or worsening sx occur. We discussed the sx which are most concerning (e.g., sudden worsening pain, fever, inability to tolerate by mouth) that necessitate immediate return. Medications administered to the patient during their visit and any new prescriptions provided to the patient are listed below.  Medications given  during this visit Medications  ondansetron (ZOFRAN) injection 4 mg (not administered)  oxyCODONE (Oxy IR/ROXICODONE) immediate release tablet 10 mg (not administered)  acetaminophen (TYLENOL) tablet 1,000 mg (not administered)  HYDROmorphone (DILAUDID) injection 2 mg (2 mg Intravenous Given 03/28/17 1418)  diphenhydrAMINE (BENADRYL) capsule 50 mg (50 mg Oral Given 03/28/17 1417)  ondansetron (ZOFRAN) injection 4 mg (4 mg Intravenous Given 03/28/17 1418)  dextrose 5 % bolus 1,000 mL (1,000 mLs Intravenous New Bag/Given 03/28/17 1430)  HYDROmorphone (DILAUDID) injection 2 mg (2 mg Intravenous Given 03/28/17 1548)  ondansetron (ZOFRAN) injection 4 mg (4 mg Intravenous Given 03/28/17 1548)  HYDROmorphone (DILAUDID) injection 2 mg (2 mg Intravenous Given 03/28/17 1613)  ketorolac (TORADOL) 30 MG/ML injection 30 mg (30 mg Intravenous Given 03/28/17 1613)     The patient appears reasonably screen and/or stabilized for discharge and I doubt any other medical condition or other [facility] requiring further screening, evaluation, or treatment in the ED at this time prior to discharge.    Final Clinical Impressions(s) / ED Diagnoses   Final diagnoses:  Sickle cell pain crisis [facility])    New Prescriptions New Prescriptions   No medications on file     Deno Etienne, DO 03/28/17 1650

## 2017-03-28 NOTE — Discharge Instructions (Signed)
Follow up with your heme/onc doc tomorrow!

## 2017-03-28 NOTE — ED Notes (Signed)
Pt states that she would like to have another IV rather than do alternate routes with her medicines.

## 2017-03-28 NOTE — ED Notes (Signed)
Pt refused DC vitals and did not wish to sign either.  She then asked for2  cups of ice to go.

## 2017-04-05 DIAGNOSIS — F609 Personality disorder, unspecified: Secondary | ICD-10-CM | POA: Diagnosis present

## 2017-04-11 ENCOUNTER — Emergency Department (HOSPITAL_COMMUNITY)
Admission: EM | Admit: 2017-04-11 | Discharge: 2017-04-11 | Disposition: A | Discharge status: Home / Self Care | Payer: Medicaid Other | Attending: Emergency Medicine | Admitting: Emergency Medicine

## 2017-04-11 ENCOUNTER — Encounter (HOSPITAL_COMMUNITY): Payer: Self-pay

## 2017-04-11 DIAGNOSIS — Z79899 Other long term (current) drug therapy: Secondary | ICD-10-CM | POA: Insufficient documentation

## 2017-04-11 DIAGNOSIS — R112 Nausea with vomiting, unspecified: Secondary | ICD-10-CM | POA: Insufficient documentation

## 2017-04-11 DIAGNOSIS — R10816 Epigastric abdominal tenderness: Secondary | ICD-10-CM | POA: Diagnosis not present

## 2017-04-11 DIAGNOSIS — D5700 Hb-SS disease with crisis, unspecified: Secondary | ICD-10-CM

## 2017-04-11 LAB — COMPREHENSIVE METABOLIC PANEL
ALBUMIN: 4.2 g/dL (ref 3.5–5.0)
ALT: 20 U/L (ref 14–54)
AST: 26 U/L (ref 15–41)
Alkaline Phosphatase: 57 U/L (ref 38–126)
Anion gap: 8 (ref 5–15)
BUN: 9 mg/dL (ref 6–20)
CALCIUM: 9.4 mg/dL (ref 8.9–10.3)
CO2: 24 mmol/L (ref 22–32)
CREATININE: 0.5 mg/dL (ref 0.44–1.00)
Chloride: 106 mmol/L (ref 101–111)
GFR calc Af Amer: >60 mL/min (ref >60)
Glucose, Bld: 104 mg/dL — ABNORMAL HIGH (ref 65–99)
POTASSIUM: 3.9 mmol/L (ref 3.5–5.1)
SODIUM: 138 mmol/L (ref 135–145)
TOTAL PROTEIN: 7.8 g/dL (ref 6.5–8.1)
Total Bilirubin: 2.2 mg/dL — ABNORMAL HIGH (ref 0.3–1.2)

## 2017-04-11 LAB — CBC WITH DIFFERENTIAL/PLATELET
BASOS ABS: 0 10*3/uL (ref 0.0–0.1)
BASOS PCT: 0 %
EOS ABS: 0.1 10*3/uL (ref 0.0–0.7)
Eosinophils Relative: 1 %
HCT: 26.5 % — ABNORMAL LOW (ref 36.0–46.0)
Hemoglobin: 9.4 g/dL — ABNORMAL LOW (ref 12.0–15.0)
LYMPHS ABS: 1 10*3/uL (ref 0.7–4.0)
LYMPHS PCT: 12 %
MCH: 28.7 pg (ref 26.0–34.0)
MCHC: 35.5 g/dL (ref 30.0–36.0)
MCV: 81 fL (ref 78.0–100.0)
Monocytes Absolute: 0.8 10*3/uL (ref 0.1–1.0)
Monocytes Relative: 9 %
NEUTROS PCT: 78 %
NRBC: 1 /100{WBCs} — AB
Neutro Abs: 6.8 10*3/uL (ref 1.7–7.7)
Platelets: 684 10*3/uL — ABNORMAL HIGH (ref 150–400)
RBC: 3.27 MIL/uL — ABNORMAL LOW (ref 3.87–5.11)
RDW: 16 % — AB (ref 11.5–15.5)
WBC: 8.7 10*3/uL (ref 4.0–10.5)

## 2017-04-11 LAB — I-STAT BETA HCG BLOOD, ED (MC, WL, AP ONLY): I-stat hCG, quantitative: <5 m[IU]/mL (ref <5)

## 2017-04-11 LAB — RETICULOCYTES
RBC.: 3.27 MIL/uL — ABNORMAL LOW (ref 3.87–5.11)
RETIC CT PCT: 7.9 % — AB (ref 0.4–3.1)
Retic Count, Absolute: 258.3 10*3/uL — ABNORMAL HIGH (ref 19.0–186.0)

## 2017-04-11 LAB — LIPASE, BLOOD: Lipase: 15 U/L (ref 11–51)

## 2017-04-11 MED ORDER — PROCHLORPERAZINE EDISYLATE 5 MG/ML IJ SOLN
5.0000 mg | Freq: Once | INTRAMUSCULAR | Status: AC
  Administered 2017-04-11: 5 mg via INTRAVENOUS
  Filled 2017-04-11: qty 2

## 2017-04-11 MED ORDER — HYDROMORPHONE HCL 1 MG/ML IJ SOLN: 2.0000 mg | INTRAMUSCULAR | Status: AC

## 2017-04-11 MED ORDER — DIPHENHYDRAMINE HCL 50 MG/ML IJ SOLN
12.5000 mg | Freq: Once | INTRAMUSCULAR | Status: AC
  Administered 2017-04-11: 12.5 mg via INTRAVENOUS
  Filled 2017-04-11: qty 1

## 2017-04-11 MED ORDER — HYDROMORPHONE HCL 1 MG/ML IJ SOLN
2.0000 mg | INTRAMUSCULAR | Status: AC
  Administered 2017-04-11 (×2): 2 mg via INTRAVENOUS
  Filled 2017-04-11 (×2): qty 2

## 2017-04-11 MED ORDER — HYDROMORPHONE HCL 1 MG/ML IJ SOLN
2.0000 mg | INTRAMUSCULAR | Status: AC
  Administered 2017-04-11: 2 mg via INTRAVENOUS

## 2017-04-11 MED ORDER — SODIUM CHLORIDE 0.45 % IV SOLN
Freq: Once | INTRAVENOUS | Status: AC
  Administered 2017-04-11: 10:00:00 via INTRAVENOUS

## 2017-04-11 MED ORDER — HYDROMORPHONE HCL 1 MG/ML IJ SOLN
2.0000 mg | INTRAMUSCULAR | Status: AC
  Filled 2017-04-11: qty 2

## 2017-04-11 MED ORDER — ONDANSETRON HCL 4 MG/2ML IJ SOLN
4.0000 mg | INTRAMUSCULAR | Status: DC | PRN
  Filled 2017-04-11: qty 2

## 2017-04-11 NOTE — ED Triage Notes (Signed)
Patient c/o whole back pain, bilateral leg pain  And vomiting since last night Patient has sickle cell.

## 2017-04-11 NOTE — ED Notes (Signed)
PT CALLED RIDE

## 2017-04-11 NOTE — Discharge Instructions (Signed)

## 2017-04-11 NOTE — ED Notes (Signed)
EDPA Provider at bedside. 

## 2017-04-11 NOTE — ED Provider Notes (Signed)
[facility] DEPT Provider Note   CSN: [id number] Arrival date & time: 04/11/17  0734     History   Chief Complaint Chief Complaint  Patient presents with  . Sickle Cell Pain Crisis    HPI Carmen [name] is a 26 y.o. female who presents emergency Department with chief complaint of nausea, vomiting and sickle cell pain crisis. She is a frequent visitor to this emergency department. Her sickle cell is managed by Dr. Noah Delaine. The patient states that she ate a pork chop and doubled eggs last night and about an hour later began having vomiting. She had multiple episodes of nonbloody, nonbilious vomitus. She denies diarrhea or constipation. She complains of pain in the epigastrium. She is a previous surgical history of splenectomy and cholecystectomy. Patient states that she does not believe she is pregnant because she is currently menstruating. She denies chest pain. She has pain in her back which she states is typical of her sickle cell pain crisis. She denies fevers, chills, shortness of breath, and neurologic symptoms.  HPI  Past Medical History:  Diagnosis Date  . Abortion    05/2012  . Headache(784.0)   . Sickle cell crisis [facility])     Patient Active Problem List   Diagnosis Date Noted  . Sickle cell pain crisis ([place]) 03/19/2017  . Sickle cell anemia ([place]) 08/15/2016  . Breast lump 08/15/2016  . Anemia of chronic disease   . 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 S/P laparoscopic cholecystectomy 11/30/2014  . Hypokalemia 11/28/2014  . Leukocytosis 11/22/2014  . Thrombocytosis ([place]) 11/22/2014    Past Surgical History:  Procedure Laterality Date  . CHOLECYSTECTOMY N/A 11/30/2014   Procedure: LAPAROSCOPIC CHOLECYSTECTOMY SINGLE SITE WITH INTRAOPERATIVE CHOLANGIOGRAM;  Surgeon: Michael Boston, MD;  Location: [facility];  Service: General;  Laterality: N/A;  . SPLENECTOMY      OB History    Gravida Para Term Preterm AB Living   1       1     SAB  TAB Ectopic Multiple Live Births                   Home Medications    Prior to Admission medications   Medication Sig Start Date End Date Taking? Authorizing Provider  morphine (MS CONTIN) 60 MG 12 hr tablet Take 60 mg by mouth 2 (two) times daily as needed for pain.    Yes [provider]  oxyCODONE-acetaminophen (PERCOCET) 10-325 MG tablet Take 1 tablet by mouth every 4 (four) hours as needed for pain.   Yes [provider]    Family History Family History  Problem Relation Age of Onset  . Hypertension Mother   . Sickle cell anemia Sister   . Kidney disease Sister        Lupus  . Arthritis Sister   . Sickle cell anemia Sister   . Sickle cell trait Sister   . Heart disease Maternal Aunt        CABG  . Heart disease Maternal Uncle        CABG  . Lupus Sister     Social History Social History  Substance Use Topics  . Smoking status: Never Smoker  . Smokeless tobacco: Never Used  . Alcohol use No     Allergies   Patient has no known allergies.   Review of Systems Review of Systems  Ten systems reviewed and are negative for acute change, except as noted in the  HPI.   Physical Exam Updated Vital Signs BP 110/63 (BP Location: Left Arm)   Pulse 90   Temp 99 F (37.2 C) (Oral)   Resp 18   Ht 5\' 3"  (1.6 m)   Wt 82.1 kg (181 lb)   LMP 03/13/2017   SpO2 100%   BMI 32.06 kg/m   Physical Exam  Constitutional: She is oriented to person, place, and time. She appears well-developed and well-nourished. No distress.  HENT:  Head: Normocephalic and atraumatic.  Eyes: Conjunctivae are normal. No scleral icterus.  Neck: Normal range of motion.  Cardiovascular: Normal rate, regular rhythm and normal heart sounds.  Exam reveals no gallop and no friction rub.   No murmur heard. Pulmonary/Chest: Effort normal and breath sounds normal. No respiratory distress.  Abdominal: Soft. Bowel sounds are normal. She exhibits no distension and no mass. There is  no tenderness. There is no guarding.  Moderately tender to palpation in the epigastrium. No CVA tenderness.  Neurological: She is alert and oriented to person, place, and time.  Skin: Skin is warm and dry. She is not diaphoretic.  Psychiatric: Her behavior is normal.  Nursing note and vitals reviewed.    ED Treatments / Results  Labs (all labs ordered are listed, but only abnormal results are displayed) Labs Reviewed  COMPREHENSIVE METABOLIC PANEL - Abnormal; Notable for the following:       Result Value   Glucose, Bld 104 (*)    Total Bilirubin 2.2 (*)    All other components within normal limits  CBC WITH DIFFERENTIAL/PLATELET - Abnormal; Notable for the following:    RBC 3.27 (*)    Hemoglobin 9.4 (*)    HCT 26.5 (*)    RDW 16.0 (*)    Platelets 684 (*)    nRBC 1 (*)    All other components within normal limits  RETICULOCYTES - Abnormal; Notable for the following:    Retic Ct Pct 7.9 (*)    RBC. 3.27 (*)    Retic Count, Manual 258.3 (*)    All other components within normal limits  LIPASE, BLOOD  I-STAT BETA HCG BLOOD, ED ([facility], [facility], [facility] ONLY)    EKG  EKG Interpretation None       Radiology No results found.  Procedures Procedures (including critical care time)  Medications Ordered in ED Medications  HYDROmorphone (DILAUDID) injection 2 mg (2 mg Intravenous Not Given 04/11/17 0935)    Or  HYDROmorphone (DILAUDID) injection 2 mg ( Subcutaneous See Alternative 04/11/17 0935)  HYDROmorphone (DILAUDID) injection 2 mg (not administered)    Or  HYDROmorphone (DILAUDID) injection 2 mg (not administered)  0.45 % sodium chloride infusion ( Intravenous Stopped 04/11/17 1257)  HYDROmorphone (DILAUDID) injection 2 mg (2 mg Intravenous Given 04/11/17 0932)    Or  HYDROmorphone (DILAUDID) injection 2 mg ( Subcutaneous See Alternative 04/11/17 0932)  HYDROmorphone (DILAUDID) injection 2 mg (2 mg Intravenous Given 04/11/17 1231)    Or  HYDROmorphone (DILAUDID) injection 2 mg (  Subcutaneous See Alternative 04/11/17 1231)  prochlorperazine (COMPAZINE) injection 5 mg (5 mg Intravenous Given 04/11/17 1037)  diphenhydrAMINE (BENADRYL) injection 12.5 mg (12.5 mg Intravenous Given 04/11/17 1037)     Initial Impression / Assessment and Plan / ED Course  I have reviewed the triage vital signs and the nursing notes.  Pertinent labs & imaging results that were available during my care of the patient were reviewed by me and considered in my medical decision making (see chart for details).  Clinical  Course as of Apr 11 1620  Mon Apr 11, 2017  1050 Patient continues to feel pain and has continued nausea  [AH]    Clinical Course User Index [AH] Margarita Mail, PA-C    Carmen [name] is a 26 y.o. female who presents to ED for pain c/w typical sickle cell crisis. No shortness of breath, fevers or signs of acute chest. Patientt otherwise in no acute distress objectively other than complaint of pain. Given IV fluids and pain medication. Will obtain labs and continue to monitor with admit vs. Discharge based on response and results of testing.   Labs at baseline.  Patient re-evaluated and feels much improved. Comfortable with discharge to home. Understands to follow up with PCP and reasons to return to ER. All questions answered.     Final Clinical Impressions(s) / ED Diagnoses   Final diagnoses:  Non-intractable vomiting with nausea, unspecified vomiting type  Sickle cell pain crisis [facility])    New Prescriptions Discharge Medication List as of 04/11/2017 12:42 PM       Margarita Mail, PA-C 04/11/17 1621    Lajean Saver, MD 04/12/17 [phone]

## 2017-04-25 ENCOUNTER — Encounter (HOSPITAL_COMMUNITY): Payer: Self-pay | Admitting: *Deleted

## 2017-04-25 ENCOUNTER — Emergency Department (HOSPITAL_COMMUNITY)
Admission: EM | Admit: 2017-04-25 | Discharge: 2017-04-25 | Disposition: A | Discharge status: Home / Self Care | Payer: Medicaid Other | Attending: Emergency Medicine | Admitting: Emergency Medicine

## 2017-04-25 DIAGNOSIS — D5700 Hb-SS disease with crisis, unspecified: Secondary | ICD-10-CM | POA: Diagnosis present

## 2017-04-25 LAB — CBC WITH DIFFERENTIAL/PLATELET
BASOS ABS: 0 10*3/uL (ref 0.0–0.1)
Basophils Relative: 0 %
EOS PCT: 1 %
Eosinophils Absolute: 0.1 10*3/uL (ref 0.0–0.7)
HEMATOCRIT: 28.7 % — AB (ref 36.0–46.0)
Hemoglobin: 10.1 g/dL — ABNORMAL LOW (ref 12.0–15.0)
LYMPHS ABS: 3.1 10*3/uL (ref 0.7–4.0)
Lymphocytes Relative: 24 %
MCH: 28.8 pg (ref 26.0–34.0)
MCHC: 35.2 g/dL (ref 30.0–36.0)
MCV: 81.8 fL (ref 78.0–100.0)
Monocytes Absolute: 1.3 10*3/uL — ABNORMAL HIGH (ref 0.1–1.0)
Monocytes Relative: 10 %
NEUTROS ABS: 8.4 10*3/uL — AB (ref 1.7–7.7)
Neutrophils Relative %: 65 %
Platelets: 463 10*3/uL — ABNORMAL HIGH (ref 150–400)
RBC: 3.51 MIL/uL — AB (ref 3.87–5.11)
RDW: 14.8 % (ref 11.5–15.5)
WBC: 12.9 10*3/uL — AB (ref 4.0–10.5)

## 2017-04-25 LAB — COMPREHENSIVE METABOLIC PANEL
ALK PHOS: 56 U/L (ref 38–126)
ALT: 21 U/L (ref 14–54)
AST: 28 U/L (ref 15–41)
Albumin: 4.3 g/dL (ref 3.5–5.0)
Anion gap: 8 (ref 5–15)
BUN: 8 mg/dL (ref 6–20)
CALCIUM: 9.3 mg/dL (ref 8.9–10.3)
CHLORIDE: 107 mmol/L (ref 101–111)
CO2: 22 mmol/L (ref 22–32)
CREATININE: 0.52 mg/dL (ref 0.44–1.00)
GFR calc non Af Amer: >60 mL/min (ref >60)
GLUCOSE: 93 mg/dL (ref 65–99)
Potassium: 4 mmol/L (ref 3.5–5.1)
SODIUM: 137 mmol/L (ref 135–145)
Total Bilirubin: 1.8 mg/dL — ABNORMAL HIGH (ref 0.3–1.2)
Total Protein: 7.5 g/dL (ref 6.5–8.1)

## 2017-04-25 LAB — RETICULOCYTES
RBC.: 3.51 MIL/uL — ABNORMAL LOW (ref 3.87–5.11)
RETIC COUNT ABSOLUTE: 150.9 10*3/uL (ref 19.0–186.0)
Retic Ct Pct: 4.3 % — ABNORMAL HIGH (ref 0.4–3.1)

## 2017-04-25 MED ORDER — HYDROMORPHONE HCL 1 MG/ML IJ SOLN: 2.0000 mg | INTRAMUSCULAR | Status: AC

## 2017-04-25 MED ORDER — ONDANSETRON HCL 4 MG/2ML IJ SOLN
4.0000 mg | INTRAMUSCULAR | Status: DC | PRN
  Administered 2017-04-25: 4 mg via INTRAVENOUS
  Filled 2017-04-25: qty 2

## 2017-04-25 MED ORDER — DEXTROSE-NACL 5-0.45 % IV SOLN
INTRAVENOUS | Status: DC
  Administered 2017-04-25: 10:00:00 via INTRAVENOUS

## 2017-04-25 MED ORDER — HYDROMORPHONE HCL 1 MG/ML IJ SOLN
2.0000 mg | INTRAMUSCULAR | Status: AC
  Administered 2017-04-25 (×2): 2 mg via INTRAVENOUS
  Filled 2017-04-25: qty 2

## 2017-04-25 MED ORDER — HYDROMORPHONE HCL 1 MG/ML IJ SOLN
2.0000 mg | INTRAMUSCULAR | Status: AC
  Administered 2017-04-25: 2 mg via INTRAVENOUS

## 2017-04-25 MED ORDER — HYDROMORPHONE HCL 1 MG/ML IJ SOLN
2.0000 mg | INTRAMUSCULAR | Status: AC
  Filled 2017-04-25: qty 2

## 2017-04-25 MED ORDER — DIPHENHYDRAMINE HCL 50 MG/ML IJ SOLN
25.0000 mg | Freq: Once | INTRAMUSCULAR | Status: AC
  Administered 2017-04-25: 25 mg via INTRAVENOUS
  Filled 2017-04-25: qty 1

## 2017-04-25 MED ORDER — HYDROMORPHONE HCL 1 MG/ML IJ SOLN
2.0000 mg | INTRAMUSCULAR | Status: AC
  Administered 2017-04-25: 2 mg via INTRAVENOUS
  Filled 2017-04-25: qty 2

## 2017-04-25 MED ORDER — KETOROLAC TROMETHAMINE 30 MG/ML IJ SOLN
30.0000 mg | INTRAMUSCULAR | Status: AC
  Administered 2017-04-25: 30 mg via INTRAVENOUS
  Filled 2017-04-25: qty 1

## 2017-04-25 NOTE — ED Notes (Signed)
Pt ambulated without difficulty to see her sister who is in Pod A

## 2017-04-25 NOTE — ED Notes (Signed)
Pt given Happy Meal per request.  Pt speaking to family on phone.

## 2017-04-25 NOTE — ED Notes (Signed)
Pt states she does not want any food and is angry that RN is asking if she wants food.

## 2017-04-25 NOTE — ED Triage Notes (Signed)
To ED for eval of bilateral leg pain that started  2 days ago. States this is how her usual sickle cell crisis begins. Pt states she has been under a lot of stress due to her mom being on 4th floor here at [facility] and the heat outside. Taking home meds without relief.

## 2017-04-25 NOTE — ED Provider Notes (Signed)
[facility] DEPT Provider Note   CSN: [id number] Arrival date & time: 04/25/17  7989     History   Chief Complaint Chief Complaint  Patient presents with  . Sickle Cell Pain Crisis    HPI Carmen [name] is a 26 y.o. female.  HPI Patient reports this is typical sickle cell crisis pain. Both arms and both legs hurt and ache. Patient denies chest pain, shortness of breath, fever. She denies abdominal pain nausea or vomiting. Past Medical History:  Diagnosis Date  . Abortion    05/2012  . Headache(784.0)   . Sickle cell crisis [facility])     Patient Active Problem List   Diagnosis Date Noted  . Sickle cell pain crisis ([place]) 03/19/2017  . Sickle cell anemia ([place]) 08/15/2016  . Breast lump 08/15/2016  . Anemia of chronic disease   . 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 S/P laparoscopic cholecystectomy 11/30/2014  . Hypokalemia 11/28/2014  . Leukocytosis 11/22/2014  . Thrombocytosis ([place]) 11/22/2014    Past Surgical History:  Procedure Laterality Date  . CHOLECYSTECTOMY N/A 11/30/2014   Procedure: LAPAROSCOPIC CHOLECYSTECTOMY SINGLE SITE WITH INTRAOPERATIVE CHOLANGIOGRAM;  Surgeon: Michael Boston, MD;  Location: [facility] ORS;  Service: General;  Laterality: N/A;  . SPLENECTOMY      OB History    Gravida Para Term Preterm AB Living   1       1     SAB TAB Ectopic Multiple Live Births                   Home Medications    Prior to Admission medications   Medication Sig Start Date End Date Taking? Authorizing Provider  morphine (MS CONTIN) 60 MG 12 hr tablet Take 60 mg by mouth 2 (two) times daily as needed for pain.    Yes [provider]  oxyCODONE-acetaminophen (PERCOCET) 10-325 MG tablet Take 1 tablet by mouth every 4 (four) hours as needed for pain.   Yes [provider]    Family History Family History  Problem Relation Age of Onset  . Hypertension Mother   . Sickle cell anemia Sister   . Kidney disease Sister      Lupus  . Arthritis Sister   . Sickle cell anemia Sister   . Sickle cell trait Sister   . Heart disease Maternal Aunt        CABG  . Heart disease Maternal Uncle        CABG  . Lupus Sister     Social History Social History  Substance Use Topics  . Smoking status: Never Smoker  . Smokeless tobacco: Never Used  . Alcohol use No     Allergies   Patient has no known allergies.   Review of Systems Review of Systems  10 Systems reviewed and are negative for acute change except as noted in the HPI.  Physical Exam Updated Vital Signs BP 107/83   Pulse 69   Temp 98.2 F (36.8 C) (Oral)   Resp 15   SpO2 99%   Physical Exam  Constitutional: She is oriented to person, place, and time. She appears well-developed and well-nourished. She appears distressed.  Patient appears uncomfortable. She is alert and appropriate. Nontoxic without any respiratory distress.  HENT:  Head: Normocephalic and atraumatic.  Nose: Nose normal.  Mouth/Throat: Oropharynx is clear and moist.  Eyes: Conjunctivae and EOM are normal. Pupils are equal, round, and reactive to light.  Neck: Neck supple.  Cardiovascular: Normal rate and regular rhythm.   No murmur heard. Pulmonary/Chest: Effort normal and breath sounds normal. No respiratory distress.  Abdominal: Soft. There is no tenderness.  Musculoskeletal: Normal range of motion. She exhibits no edema, tenderness or deformity.  No peripheral edema or soft tissue abnormalities. Patient is neurovascularly intact.  Neurological: She is alert and oriented to person, place, and time. No cranial nerve deficit. She exhibits normal muscle tone. Coordination normal.  Skin: Skin is warm and dry.  Psychiatric: She has a normal mood and affect.  Nursing note and vitals reviewed.    ED Treatments / Results  Labs (all labs ordered are listed, but only abnormal results are displayed) Labs Reviewed  CBC WITH DIFFERENTIAL/PLATELET - Abnormal; Notable for the  following:       Result Value   WBC 12.9 (*)    RBC 3.51 (*)    Hemoglobin 10.1 (*)    HCT 28.7 (*)    Platelets 463 (*)    Neutro Abs 8.4 (*)    Monocytes Absolute 1.3 (*)    All other components within normal limits  RETICULOCYTES - Abnormal; Notable for the following:    Retic Ct Pct 4.3 (*)    RBC. 3.51 (*)    All other components within normal limits  COMPREHENSIVE METABOLIC PANEL - Abnormal; Notable for the following:    Total Bilirubin 1.8 (*)    All other components within normal limits    EKG  EKG Interpretation None       Radiology No results found.  Procedures Procedures (including critical care time)  Medications Ordered in ED Medications  dextrose 5 %-0.45 % sodium chloride infusion ( Intravenous New Bag/Given 04/25/17 0944)  ondansetron (ZOFRAN) injection 4 mg (4 mg Intravenous Given 04/25/17 0943)  ketorolac (TORADOL) 30 MG/ML injection 30 mg (30 mg Intravenous Given 04/25/17 0943)  diphenhydrAMINE (BENADRYL) injection 25 mg (25 mg Intravenous Given 04/25/17 0943)  HYDROmorphone (DILAUDID) injection 2 mg (2 mg Intravenous Given 04/25/17 1036)    Or  HYDROmorphone (DILAUDID) injection 2 mg ( Subcutaneous See Alternative 04/25/17 1036)  HYDROmorphone (DILAUDID) injection 2 mg (2 mg Intravenous Given 04/25/17 1112)    Or  HYDROmorphone (DILAUDID) injection 2 mg ( Subcutaneous See Alternative 04/25/17 1112)  HYDROmorphone (DILAUDID) injection 2 mg (2 mg Intravenous Given 04/25/17 1218)    Or  HYDROmorphone (DILAUDID) injection 2 mg ( Subcutaneous See Alternative 04/25/17 1218)  HYDROmorphone (DILAUDID) injection 2 mg (2 mg Intravenous Given 04/25/17 1503)    Or  HYDROmorphone (DILAUDID) injection 2 mg ( Subcutaneous See Alternative 04/25/17 1503)     Initial Impression / Assessment and Plan / ED Course  I have reviewed the triage vital signs and the nursing notes.  Pertinent labs & imaging results that were available during my care of the patient were reviewed  by me and considered in my medical decision making (see chart for details).    13:20 Patient ports she still has some pain. She's had 2 doses of Dilaudid. Pressures are mildly hypotensive. Patient is eating some crackers. She reports she wants to try her third dose of Dilaudid to see if that eliminates her pain and at that point reassess for possible admission.  15:55 patient reports that she feels better and wants to go home. Discussed admission which she declines.  Final Clinical Impressions(s) / ED Diagnoses   Final diagnoses:  Sickle cell pain crisis [name])    New Prescriptions New Prescriptions   No medications on file  Charlesetta Shanks, MD 04/25/17 [phone]

## 2017-04-25 NOTE — ED Notes (Signed)
Pt called out, stating "My pain is back. Can I have something for pain? I know my BP is low but can I have something to eat? I haven't eaten in 4 days." RN aware.

## 2017-04-26 ENCOUNTER — Encounter (HOSPITAL_COMMUNITY): Payer: Self-pay | Admitting: *Deleted

## 2017-04-26 ENCOUNTER — Emergency Department (HOSPITAL_COMMUNITY)
Admission: EM | Admit: 2017-04-26 | Discharge: 2017-04-26 | Disposition: A | Discharge status: Home / Self Care | Payer: Medicaid Other | Source: Home / Self Care | Attending: Emergency Medicine | Admitting: Emergency Medicine

## 2017-04-26 DIAGNOSIS — D5700 Hb-SS disease with crisis, unspecified: Secondary | ICD-10-CM | POA: Insufficient documentation

## 2017-04-26 LAB — COMPREHENSIVE METABOLIC PANEL
ALK PHOS: 58 U/L (ref 38–126)
ALT: 21 U/L (ref 14–54)
AST: 29 U/L (ref 15–41)
Albumin: 4.4 g/dL (ref 3.5–5.0)
Anion gap: 8 (ref 5–15)
BUN: 9 mg/dL (ref 6–20)
CALCIUM: 9.7 mg/dL (ref 8.9–10.3)
CHLORIDE: 107 mmol/L (ref 101–111)
CO2: 25 mmol/L (ref 22–32)
Creatinine, Ser: 0.46 mg/dL (ref 0.44–1.00)
GFR calc non Af Amer: >60 mL/min (ref >60)
Glucose, Bld: 85 mg/dL (ref 65–99)
Potassium: 4.1 mmol/L (ref 3.5–5.1)
SODIUM: 140 mmol/L (ref 135–145)
Total Bilirubin: 1.9 mg/dL — ABNORMAL HIGH (ref 0.3–1.2)
Total Protein: 8.1 g/dL (ref 6.5–8.1)

## 2017-04-26 LAB — CBC WITH DIFFERENTIAL/PLATELET
BASOS PCT: 0 %
Basophils Absolute: 0 10*3/uL (ref 0.0–0.1)
EOS ABS: 0.1 10*3/uL (ref 0.0–0.7)
EOS PCT: 1 %
HCT: 28.7 % — ABNORMAL LOW (ref 36.0–46.0)
Hemoglobin: 10.2 g/dL — ABNORMAL LOW (ref 12.0–15.0)
LYMPHS ABS: 4.8 10*3/uL — AB (ref 0.7–4.0)
Lymphocytes Relative: 43 %
MCH: 28.9 pg (ref 26.0–34.0)
MCHC: 35.5 g/dL (ref 30.0–36.0)
MCV: 81.3 fL (ref 78.0–100.0)
MONOS PCT: 10 %
Monocytes Absolute: 1.1 10*3/uL — ABNORMAL HIGH (ref 0.1–1.0)
NEUTROS PCT: 46 %
Neutro Abs: 5.2 10*3/uL (ref 1.7–7.7)
Platelets: 500 10*3/uL — ABNORMAL HIGH (ref 150–400)
RBC: 3.53 MIL/uL — ABNORMAL LOW (ref 3.87–5.11)
RDW: 14.8 % (ref 11.5–15.5)
WBC: 11.3 10*3/uL — ABNORMAL HIGH (ref 4.0–10.5)

## 2017-04-26 LAB — RETICULOCYTES
RBC.: 3.53 MIL/uL — AB (ref 3.87–5.11)
RETIC CT PCT: 4.7 % — AB (ref 0.4–3.1)
Retic Count, Absolute: 165.9 10*3/uL (ref 19.0–186.0)

## 2017-04-26 MED ORDER — HYDROMORPHONE HCL 1 MG/ML IJ SOLN
2.0000 mg | INTRAMUSCULAR | Status: AC
  Administered 2017-04-26: 2 mg via INTRAVENOUS

## 2017-04-26 MED ORDER — KETOROLAC TROMETHAMINE 30 MG/ML IJ SOLN
30.0000 mg | INTRAMUSCULAR | Status: AC
  Administered 2017-04-26: 30 mg via INTRAVENOUS
  Filled 2017-04-26: qty 1

## 2017-04-26 MED ORDER — DEXTROSE-NACL 5-0.45 % IV SOLN
INTRAVENOUS | Status: DC
  Administered 2017-04-26: 21:00:00 via INTRAVENOUS

## 2017-04-26 MED ORDER — HYDROMORPHONE HCL 1 MG/ML IJ SOLN: 2.0000 mg | INTRAMUSCULAR | Status: AC

## 2017-04-26 MED ORDER — HYDROMORPHONE HCL 1 MG/ML IJ SOLN: 2.0000 mg | INTRAMUSCULAR | Status: DC

## 2017-04-26 MED ORDER — HYDROMORPHONE HCL 1 MG/ML IJ SOLN
2.0000 mg | INTRAMUSCULAR | Status: DC
  Filled 2017-04-26: qty 2

## 2017-04-26 MED ORDER — HYDROMORPHONE HCL 1 MG/ML IJ SOLN
0.5000 mg | Freq: Once | INTRAMUSCULAR | Status: AC
  Administered 2017-04-26: 0.5 mg via SUBCUTANEOUS
  Filled 2017-04-26: qty 0.5

## 2017-04-26 MED ORDER — HYDROMORPHONE HCL 1 MG/ML IJ SOLN
2.0000 mg | INTRAMUSCULAR | Status: AC
  Administered 2017-04-26: 2 mg via INTRAVENOUS
  Filled 2017-04-26: qty 2

## 2017-04-26 MED ORDER — ONDANSETRON 4 MG PO TBDP
4.0000 mg | ORAL_TABLET | Freq: Once | ORAL | Status: AC
  Administered 2017-04-26: 4 mg via ORAL
  Filled 2017-04-26: qty 1

## 2017-04-26 NOTE — ED Provider Notes (Signed)
[facility] DEPT Provider Note   CSN: [id number] Arrival date & time: 04/26/17  1613     History   Chief Complaint Chief Complaint  Patient presents with  . Sickle Cell Pain Crisis  . generalized pain    HPI Carmen [name] is a 26 y.o. female.  HPI  Patient, with a past medical history of sickle cell, presents with generalized pain that began yesterday and is consistent with her previous sickle cell crises. Reports pain more in the arms and legs. She states that pain has increased despite her home morphine and oxycodone usage earlier this morning. She states that she was seen here in the ED yesterday and pain was managed here well, But pain returned earlier this morning. She is followed by Dr. Noah Delaine. She denies any chest pain, cough, fevers, trouble breathing, nausea, vomiting, blood in stool, diarrhea, constipation.  Past Medical History:  Diagnosis Date  . Abortion    05/2012  . Headache(784.0)   . Sickle cell crisis [facility])     Patient Active Problem List   Diagnosis Date Noted  . Sickle cell pain crisis ([place]) 03/19/2017  . Sickle cell anemia ([place]) 08/15/2016  . Breast lump 08/15/2016  . Anemia of chronic disease   . 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 S/P laparoscopic cholecystectomy 11/30/2014  . Hypokalemia 11/28/2014  . Leukocytosis 11/22/2014  . Thrombocytosis ([place]) 11/22/2014    Past Surgical History:  Procedure Laterality Date  . CHOLECYSTECTOMY N/A 11/30/2014   Procedure: LAPAROSCOPIC CHOLECYSTECTOMY SINGLE SITE WITH INTRAOPERATIVE CHOLANGIOGRAM;  Surgeon: Michael Boston, MD;  Location: [facility] ORS;  Service: General;  Laterality: N/A;  . SPLENECTOMY      OB History    Gravida Para Term Preterm AB Living   1       1     SAB TAB Ectopic Multiple Live Births                   Home Medications    Prior to Admission medications   Medication Sig Start Date End Date Taking? Authorizing Provider  morphine (MS CONTIN) 60 MG  12 hr tablet Take 60 mg by mouth 2 (two) times daily as needed for pain.     [provider]  oxyCODONE-acetaminophen (PERCOCET) 10-325 MG tablet Take 1 tablet by mouth every 4 (four) hours as needed for pain.    [provider]    Family History Family History  Problem Relation Age of Onset  . Hypertension Mother   . Sickle cell anemia Sister   . Kidney disease Sister        Lupus  . Arthritis Sister   . Sickle cell anemia Sister   . Sickle cell trait Sister   . Heart disease Maternal Aunt        CABG  . Heart disease Maternal Uncle        CABG  . Lupus Sister     Social History Social History  Substance Use Topics  . Smoking status: Never Smoker  . Smokeless tobacco: Never Used  . Alcohol use No     Allergies   Patient has no known allergies.   Review of Systems Review of Systems  Constitutional: Negative for appetite change, chills and fever.  HENT: Negative for ear pain, rhinorrhea, sneezing and sore throat.   Eyes: Negative for photophobia and visual disturbance.  Respiratory: Negative for cough, chest tightness, shortness of breath and wheezing.   Cardiovascular: Negative for  chest pain and palpitations.  Gastrointestinal: Positive for nausea. Negative for abdominal pain, blood in stool, constipation, diarrhea and vomiting.  Genitourinary: Negative for dysuria, hematuria and urgency.  Musculoskeletal: Positive for arthralgias and myalgias.  Skin: Negative for rash.  Neurological: Negative for dizziness, weakness and light-headedness.     Physical Exam Updated Vital Signs BP 115/80   Pulse 77   Temp 98.5 F (36.9 C) (Oral)   Resp 18   Ht 5\' 3"  (1.6 m)   Wt 82.1 kg (181 lb)   LMP 04/09/2017   SpO2 95%   BMI 32.06 kg/m   Physical Exam  Constitutional: She appears well-developed and well-nourished. No distress.  Appears uncomfortable but not in distress. She is alert otherwise.  HENT:  Head: Normocephalic and atraumatic.  Nose:  Nose normal.  Eyes: Conjunctivae and EOM are normal. Left eye exhibits no discharge. No scleral icterus.  Neck: Normal range of motion. Neck supple.  Cardiovascular: Normal rate, regular rhythm, normal heart sounds and intact distal pulses.  Exam reveals no gallop and no friction rub.   No murmur heard. Pulmonary/Chest: Effort normal and breath sounds normal. No respiratory distress.  Abdominal: Soft. Bowel sounds are normal. She exhibits no distension. There is no tenderness. There is no guarding.  Musculoskeletal: Normal range of motion. She exhibits no edema.  Tenderness to palpation all over bilateral lower extremities. No edema, deformity or temperature changes noted. Extremities appear neurovascularly intact.  Neurological: She is alert. She exhibits normal muscle tone. Coordination normal.  Skin: Skin is warm and dry. No rash noted.  Psychiatric: She has a normal mood and affect.  Nursing note and vitals reviewed.    ED Treatments / Results  Labs (all labs ordered are listed, but only abnormal results are displayed) Labs Reviewed  COMPREHENSIVE METABOLIC PANEL  CBC WITH DIFFERENTIAL/PLATELET  RETICULOCYTES    EKG  EKG Interpretation None       Radiology No results found.  Procedures Procedures (including critical care time)  Medications Ordered in ED Medications  HYDROmorphone (DILAUDID) injection 0.5 mg (0.5 mg Subcutaneous Given 04/26/17 1711)  ondansetron (ZOFRAN-ODT) disintegrating tablet 4 mg (4 mg Oral Given 04/26/17 1711)     Initial Impression / Assessment and Plan / ED Course  I have reviewed the triage vital signs and the nursing notes.  Pertinent labs & imaging results that were available during my care of the patient were reviewed by me and considered in my medical decision making (see chart for details).     Patient presents to ED for sickle cell pain crisis. She reports generalized pain more in her arms and legs which is consistent with her usual  pain crises. Patient denies chest pain, shortness of breath or trouble breathing. Low suspicion for acute chest or pneumonia. She is afebrile with no history of fever and nontoxic appearing. CBC, CMP and retic count consistent with baseline. Sickle cell protocol was initiated. Patient given Dilaudid, Toradol, Zofran and fluids. Patient still reports pain after 3 doses of Dilaudid. Patient requested if she could get 1 more dose of Dilaudid that will hopefully help with her pain. I told her that due to the protocol in place I will not be able to give her any more pain medication unless she decided to be admitted for further pain control. Patient asked whether she could be kept here in the ED until the sickle cell clinic opens in the morning. I told her that due to the medications that we have and the length  of time before the clinic opens, we will not be able to do this for her. I again offered her admission for further pain control. Patient refuses to be admitted and states that she has a job that she has to go to tomorrow, that she could potentially lose if she gets admitted. I informed patient that she should take her home medications but should return to the ED for any severe or worsening symptoms. She agreed to this plan and appears stable for discharge at this time.  Final Clinical Impressions(s) / ED Diagnoses   Final diagnoses:  None    New Prescriptions New Prescriptions   No medications on file     Delia Heady, Hershal Coria 04/27/17 1540    Dorie Rank, MD 04/29/17 [phone]

## 2017-04-26 NOTE — Discharge Instructions (Signed)
Take home medications as previously prescribed. Follow-up with PCP for further evaluation. Return to ED for worsening pain, chest pain, trouble breathing, injury, abdominal pain, trouble walking, numbness, weakness.

## 2017-04-26 NOTE — ED Notes (Signed)
Unsuccessful IV attempt x1. IV team consult placed. 

## 2017-04-26 NOTE — ED Notes (Signed)
IV team at bedside 

## 2017-04-26 NOTE — ED Triage Notes (Signed)
Pt reports generalized pain, as per her norm during a crisis. Pt is A&O x 4, in NAD.

## 2017-04-27 ENCOUNTER — Inpatient Hospital Stay (HOSPITAL_COMMUNITY)
Admission: EM | Admit: 2017-04-27 | Discharge: 2017-04-27 | DRG: 812 | Disposition: A | Discharge status: Home / Self Care | Payer: Medicaid Other | Attending: Internal Medicine | Admitting: Internal Medicine

## 2017-04-27 DIAGNOSIS — Z79899 Other long term (current) drug therapy: Secondary | ICD-10-CM

## 2017-04-27 DIAGNOSIS — D5700 Hb-SS disease with crisis, unspecified: Secondary | ICD-10-CM | POA: Diagnosis present

## 2017-04-27 LAB — CBC WITH DIFFERENTIAL/PLATELET
BASOS ABS: 0 10*3/uL (ref 0.0–0.1)
BASOS PCT: 0 %
EOS ABS: 0.2 10*3/uL (ref 0.0–0.7)
EOS PCT: 2 %
HCT: 28 % — ABNORMAL LOW (ref 36.0–46.0)
Hemoglobin: 9.9 g/dL — ABNORMAL LOW (ref 12.0–15.0)
LYMPHS PCT: 40 %
Lymphs Abs: 3.6 10*3/uL (ref 0.7–4.0)
MCH: 28.5 pg (ref 26.0–34.0)
MCHC: 35.4 g/dL (ref 30.0–36.0)
MCV: 80.7 fL (ref 78.0–100.0)
Monocytes Absolute: 1.2 10*3/uL — ABNORMAL HIGH (ref 0.1–1.0)
Monocytes Relative: 14 %
Neutro Abs: 3.8 10*3/uL (ref 1.7–7.7)
Neutrophils Relative %: 44 %
PLATELETS: 522 10*3/uL — AB (ref 150–400)
RBC: 3.47 MIL/uL — AB (ref 3.87–5.11)
RDW: 15.2 % (ref 11.5–15.5)
WBC: 8.8 10*3/uL (ref 4.0–10.5)

## 2017-04-27 LAB — RETICULOCYTES
RBC.: 3.47 MIL/uL — ABNORMAL LOW (ref 3.87–5.11)
RETIC COUNT ABSOLUTE: 159.6 10*3/uL (ref 19.0–186.0)
RETIC CT PCT: 4.6 % — AB (ref 0.4–3.1)

## 2017-04-27 LAB — BASIC METABOLIC PANEL
ANION GAP: 10 (ref 5–15)
BUN: 7 mg/dL (ref 6–20)
CO2: 24 mmol/L (ref 22–32)
Calcium: 9.8 mg/dL (ref 8.9–10.3)
Chloride: 104 mmol/L (ref 101–111)
Creatinine, Ser: 0.49 mg/dL (ref 0.44–1.00)
Glucose, Bld: 98 mg/dL (ref 65–99)
POTASSIUM: 3.5 mmol/L (ref 3.5–5.1)
SODIUM: 138 mmol/L (ref 135–145)

## 2017-04-27 MED ORDER — OXYCODONE-ACETAMINOPHEN 5-325 MG PO TABS
2.0000 | ORAL_TABLET | Freq: Once | ORAL | Status: DC
  Filled 2017-04-27: qty 2

## 2017-04-27 MED ORDER — SODIUM CHLORIDE 0.45 % IV SOLN
INTRAVENOUS | Status: DC
  Administered 2017-04-27: 125 mL/h via INTRAVENOUS

## 2017-04-27 MED ORDER — KETOROLAC TROMETHAMINE 15 MG/ML IJ SOLN
15.0000 mg | INTRAMUSCULAR | Status: AC
  Administered 2017-04-27: 15 mg via INTRAVENOUS
  Filled 2017-04-27: qty 1

## 2017-04-27 MED ORDER — HYDROMORPHONE HCL 1 MG/ML IJ SOLN: 2.0000 mg | INTRAMUSCULAR | Status: AC

## 2017-04-27 MED ORDER — OXYCODONE-ACETAMINOPHEN 5-325 MG PO TABS
2.0000 | ORAL_TABLET | Freq: Once | ORAL | Status: AC
  Administered 2017-04-27: 2 via ORAL
  Filled 2017-04-27: qty 2

## 2017-04-27 MED ORDER — HYDROMORPHONE HCL 1 MG/ML IJ SOLN
2.0000 mg | INTRAMUSCULAR | Status: AC
  Administered 2017-04-27: 2 mg via INTRAVENOUS
  Filled 2017-04-27: qty 2

## 2017-04-27 MED ORDER — ONDANSETRON HCL 4 MG/2ML IJ SOLN: 4.0000 mg | INTRAMUSCULAR | Status: DC | PRN

## 2017-04-27 MED ORDER — DIPHENHYDRAMINE HCL 50 MG/ML IJ SOLN
25.0000 mg | Freq: Once | INTRAMUSCULAR | Status: AC
  Administered 2017-04-27: 25 mg via INTRAVENOUS
  Filled 2017-04-27: qty 1

## 2017-04-27 MED ORDER — HYDROMORPHONE HCL 1 MG/ML IJ SOLN
2.0000 mg | Freq: Once | INTRAMUSCULAR | Status: AC
  Administered 2017-04-27: 2 mg via INTRAVENOUS
  Filled 2017-04-27: qty 2

## 2017-04-27 NOTE — ED Notes (Signed)
Explained to patient that Dilaudid not available at this time and called Pharmacy about not having Dilaudid available to give this patient. Pharmacist stated earlier when called  that they would load some in the pyxis. Patient began asking about patient experience staff and why writer did not bring them in her room. Explained to patient that writer would give her a number to call patient experience. Patient then began asking about charge nurse. Explained to patient that the charge nurse and ED director were made aware of the situation.

## 2017-04-27 NOTE — ED Provider Notes (Signed)
Patient presents with typical sickle cell pain. She is presently alert appears uncomfortable after multiple doses of IV opioid pain medicine. Dr. Zigmund Daniel consulted and will arrange for overnight stay   Orlie Dakin, MD 04/27/17 1430

## 2017-04-27 NOTE — ED Notes (Signed)
Attempted to call report. Receiving nurse with another patient and to call the ED for report.

## 2017-04-27 NOTE — ED Provider Notes (Addendum)
[facility] DEPT Provider Note   CSN: [id number] Arrival date & time: 04/27/17  5852     History   Chief Complaint Chief Complaint  Patient presents with  . Sickle Cell Pain Crisis    HPI Carmen [name] is a 26 y.o. female.  Carmen [name] is a 26 y.o. Female who presents to the ED complaining of a sickle cell pain crisis. Patient reports she's been having sickle cell pain crisis ongoing for several days now. She's been to the hospital several times and feels much better at discharge and then this returns when she wakes up in the morning. She reports yesterday she was feeling better at discharge and took her Percocet when she got home. She reports when she woke up this morning she is having continued sickle cell pain that is worsened. She complains of pain to her bilateral legs and her arms. She has taken no treatments for her symptoms today. She is followed by Dr. Alyson Ingles. She denies fevers, chest pain, shortness of breath, coughing, abdominal pain, nausea, vomiting, urinary symptoms or rashes.   The history is provided by the patient and medical records. No language interpreter was used.  Sickle Cell Pain Crisis  Associated symptoms: no chest pain, no congestion, no cough, no fever, no headaches, no nausea, no shortness of breath, no sore throat and no vomiting     Past Medical History:  Diagnosis Date  . Abortion    05/2012  . Headache(784.0)   . Sickle cell crisis [facility])     Patient Active Problem List   Diagnosis Date Noted  . Sickle cell pain crisis ([place]) 03/19/2017  . Sickle cell anemia ([place]) 08/15/2016  . Breast lump 08/15/2016  . Anemia of chronic disease   . 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 S/P laparoscopic cholecystectomy 11/30/2014  . Hypokalemia 11/28/2014  . Leukocytosis 11/22/2014  . Thrombocytosis ([place]) 11/22/2014    Past Surgical History:  Procedure Laterality Date  . CHOLECYSTECTOMY N/A 11/30/2014   Procedure:  LAPAROSCOPIC CHOLECYSTECTOMY SINGLE SITE WITH INTRAOPERATIVE CHOLANGIOGRAM;  Surgeon: Michael Boston, MD;  Location: [facility];  Service: General;  Laterality: N/A;  . SPLENECTOMY      OB History    Gravida Para Term Preterm AB Living   1       1     SAB TAB Ectopic Multiple Live Births                   Home Medications    Prior to Admission medications   Medication Sig Start Date End Date Taking? Authorizing Provider  morphine (MS CONTIN) 60 MG 12 hr tablet Take 60 mg by mouth 2 (two) times daily as needed for pain.    Yes [provider]  oxyCODONE-acetaminophen (PERCOCET) 10-325 MG tablet Take 1 tablet by mouth every 4 (four) hours as needed for pain.   Yes [provider]    Family History Family History  Problem Relation Age of Onset  . Hypertension Mother   . Sickle cell anemia Sister   . Kidney disease Sister        Lupus  . Arthritis Sister   . Sickle cell anemia Sister   . Sickle cell trait Sister   . Heart disease Maternal Aunt        CABG  . Heart disease Maternal Uncle        CABG  . Lupus Sister     Social History Social History  Substance Use Topics  . Smoking status: Never Smoker  . Smokeless tobacco: Never Used  . Alcohol use No     Allergies   Patient has no known allergies.   Review of Systems Review of Systems  Constitutional: Negative for chills and fever.  HENT: Negative for congestion and sore throat.   Eyes: Negative for visual disturbance.  Respiratory: Negative for cough and shortness of breath.   Cardiovascular: Negative for chest pain and leg swelling.  Gastrointestinal: Negative for abdominal pain, diarrhea, nausea and vomiting.  Genitourinary: Negative for dysuria.  Musculoskeletal: Positive for arthralgias and back pain. Negative for neck pain.  Skin: Negative for rash.  Neurological: Negative for headaches.     Physical Exam Updated Vital Signs BP 129/71 (BP Location: Right Arm)   Pulse 63   Temp 98.3  F (36.8 C) (Oral)   Resp 16   Ht 5\' 3"  (1.6 m)   Wt 82.1 kg (181 lb)   LMP 04/09/2017   SpO2 100%   BMI 32.06 kg/m   Physical Exam  Constitutional: She is oriented to person, place, and time. She appears well-developed and well-nourished. No distress.  Nontoxic appearing. In no apparent distress.   HENT:  Head: Normocephalic and atraumatic.  Mouth/Throat: Oropharynx is clear and moist.  Eyes: Conjunctivae are normal. Pupils are equal, round, and reactive to light. Right eye exhibits no discharge. Left eye exhibits no discharge.  Neck: Normal range of motion. Neck supple. No JVD present.  Cardiovascular: Normal rate, regular rhythm, normal heart sounds and intact distal pulses.  Exam reveals no gallop and no friction rub.   No murmur heard. Pulmonary/Chest: Effort normal and breath sounds normal. No stridor. No respiratory distress. She has no wheezes. She has no rales.  Lungs are clear to ascultation bilaterally. Symmetric chest expansion bilaterally. No increased work of breathing. No rales or rhonchi.    Abdominal: Soft. There is no tenderness. There is no guarding.  Musculoskeletal: Normal range of motion. She exhibits no edema, tenderness or deformity.  Patient's bilateral shoulder, elbow, wrist, hip, knee and ankle joints are supple without erythema, edema or tenderness.  Lymphadenopathy:    She has no cervical adenopathy.  Neurological: She is alert and oriented to person, place, and time. Coordination normal.  Skin: Skin is warm and dry. Capillary refill takes less than 2 seconds. No rash noted. She is not diaphoretic. No erythema. No pallor.  Psychiatric: She has a normal mood and affect. Her behavior is normal.  Nursing note and vitals reviewed.    ED Treatments / Results  Labs (all labs ordered are listed, but only abnormal results are displayed) Labs Reviewed  CBC WITH DIFFERENTIAL/PLATELET - Abnormal; Notable for the following:       Result Value   RBC 3.47 (*)      Hemoglobin 9.9 (*)    HCT 28.0 (*)    Platelets 522 (*)    Monocytes Absolute 1.2 (*)    All other components within normal limits  RETICULOCYTES - Abnormal; Notable for the following:    Retic Ct Pct 4.6 (*)    RBC. 3.47 (*)    All other components within normal limits  BASIC METABOLIC PANEL    EKG  EKG Interpretation None       Radiology No results found.  Procedures Procedures (including critical care time)  Medications Ordered in ED Medications  0.45 % sodium chloride infusion (125 mL/hr Intravenous New Bag/Given 04/27/17 1114)  ondansetron (ZOFRAN) injection 4 mg (not  administered)  oxyCODONE-acetaminophen (PERCOCET/ROXICET) 5-325 MG per tablet 2 tablet (not administered)  ketorolac (TORADOL) 15 MG/ML injection 15 mg (15 mg Intravenous Given 04/27/17 1102)  HYDROmorphone (DILAUDID) injection 2 mg (2 mg Intravenous Given 04/27/17 1107)    Or  HYDROmorphone (DILAUDID) injection 2 mg ( Subcutaneous See Alternative 04/27/17 1107)  HYDROmorphone (DILAUDID) injection 2 mg (2 mg Intravenous Given 04/27/17 1157)    Or  HYDROmorphone (DILAUDID) injection 2 mg ( Subcutaneous See Alternative 04/27/17 1157)  HYDROmorphone (DILAUDID) injection 2 mg (2 mg Intravenous Given 04/27/17 1249)    Or  HYDROmorphone (DILAUDID) injection 2 mg ( Subcutaneous See Alternative 04/27/17 1249)  diphenhydrAMINE (BENADRYL) injection 25 mg (25 mg Intravenous Given 04/27/17 1104)     Initial Impression / Assessment and Plan / ED Course  I have reviewed the triage vital signs and the nursing notes.  Pertinent labs & imaging results that were available during my care of the patient were reviewed by me and considered in my medical decision making (see chart for details).     This is a 26 y.o. Female who presents to the ED complaining of a sickle cell pain crisis. Patient reports she's been having sickle cell pain crisis ongoing for several days now. She's been to the hospital several times and feels  much better at discharge and then this returns when she wakes up in the morning. She reports yesterday she was feeling better at discharge and took her Percocet when she got home. She reports when she woke up this morning she is having continued sickle cell pain that is worsened. She complains of pain to her bilateral legs and her arms. She has taken no treatments for her symptoms today. She is followed by Dr. Alyson Ingles. On exam the patient is afebrile nontoxic appearing. Lungs clear to auscultation bilaterally. BMP is unremarkable. CBC shows a hemoglobin at her baseline. Reticulocyte count is stable from recent blood work. Sickle cell protocol initiated. On reevaluation after 3 rounds of pain medication patient reports she still having pain and wants to go to the sickle cell clinic but does not want to be admitted. Unfortunately, after calling the sickle cell clinic at 1:15 they advised they do not accept patients after 1 PM. They are unfortunately on lunch until 1 PM.  I advised the patient of this and offered admission. Patient declines. She tells me she would like to go home and her like 2 Percocet before she leaves. Will provide her with an order dose of Percocet and have her follow up with primary care. She also request for a refill for her morphine because she is out. I declined and encouraged her to follow-up with her PCP. Return precautions discussed. I advised the patient to follow-up with their primary care provider this week. I advised the patient to return to the emergency department with new or worsening symptoms or new concerns. The patient verbalized understanding and agreement with plan.    Later, RN came to find me and reports the patient is upset and refuses percocet because the nurse is a racist. I went to bedside and found the patient crying. Dr. Winfred Leeds is at bedside. He is speaking with the patient. Suggested admission again and she agrees for admission because she reports she is still  having pain. Will admit and give additional dilaudid.   3:58 PM Later, RN came to report to me that patient is refusing admission. She wants her IV removed and wants to be discharged. She would like the Percocet.  She does not want to stay in the hospital. I want to bedside and she tells me she does not want to stay in the hospital tonight and wants her IV removed and to be discharged. She tells me she would like the Percocet before she leaves. We'll provide her with a Percocet and discharged her.    Final Clinical Impressions(s) / ED Diagnoses   Final diagnoses:  Sickle cell pain crisis [facility])    New Prescriptions New Prescriptions   No medications on file     Waynetta Pean, Hershal Coria 04/27/17 1358    Waynetta Pean, PA-C 04/27/17 1433    Orlie Dakin, MD 04/27/17 1523    Waynetta Pean, PA-C 04/27/17 1558    Orlie Dakin, MD 04/27/17 [phone]

## 2017-04-27 NOTE — ED Notes (Signed)
Pt asked to be stuck by RN. Pt states that she is difficult stick and does not want to be stuck more than once.

## 2017-04-27 NOTE — ED Notes (Signed)
Attempted to give the patient Percocet x 2. Patient was crying and stating that she wanted to see the charge nurse immediately after I give the medication. I explained to the patient that I could not guarantee that the charge nurse could come immediately after I gave the medicine. Patient began crying, stating that the white nurse  with glasses Jinny Blossom) called her ignorant and "she called a black woman ignorant and she is white.". I asked the patient not to bring race into it because we were staff members. Patient began screaming at the writer telling the Probation officer that I was Armed forces training and education officer stated, "I am not a racist." Patient stated she had never heard of the sickle cell clinic not accepting patienst after 1100 and felt like we were playing with her emotions. Patient offered  Pain medicine again and stated that she would only take the medicine from the charge nurse. Tim, Camera operator notified. EDPA, Will notified. Dr. Winfred Leeds in the room when [place] started into the room to talk with the patient. Dr. Winfred Leeds asked the EDPa to wait a moment and he finished talking with the patient.

## 2017-04-27 NOTE — ED Triage Notes (Signed)
Patient is alert and oriented x4.  She is complaining of generalize pain from her sickle cell that started 4 days ago.  Currently she rates at 10 of 10.

## 2017-04-27 NOTE — ED Notes (Signed)
Attempted x 2 to start IV by 2 different persons. Failed attempts. Patient was offered to receive pain meds [place], but refused, stating, "I do not like it like that." Charge nurse to attempt to start IV.

## 2017-04-27 NOTE — Progress Notes (Signed)
I went to to nursing admission history on patient. She asked if she was going to go upstairs to a room. I told her she was as far as I knew but I would double check with her nurse. Patient states she must leave if she is going to go to a room upstairs. She states she has to go to work tomorrow. I checked with pt's rn Elaina Hoops RN. Shelia verified that patient has a room assigned upstairs - Room 1325. I went and told patient that she does have a room upstairs - room 1325. I asked patient if she will be staying to go up there. She states she needs to leave. She wants the nurse to discontinue her iv and bring her paperwork. Patient states she is suppose to get a percocet before she leaves. I told patient that she will need to discuss medications with her nurse. Elaina Hoops RN informed. Lucius Conn BSN, RN-BC Admissions RN 04/27/2017 3:54 PM

## 2017-04-28 ENCOUNTER — Encounter (HOSPITAL_COMMUNITY): Payer: Self-pay | Admitting: *Deleted

## 2017-04-28 ENCOUNTER — Emergency Department (HOSPITAL_COMMUNITY)
Admission: EM | Admit: 2017-04-28 | Discharge: 2017-04-28 | Disposition: A | Discharge status: Home / Self Care | Payer: Medicaid Other | Attending: Emergency Medicine | Admitting: Emergency Medicine

## 2017-04-28 DIAGNOSIS — D5700 Hb-SS disease with crisis, unspecified: Secondary | ICD-10-CM | POA: Insufficient documentation

## 2017-04-28 DIAGNOSIS — Z79899 Other long term (current) drug therapy: Secondary | ICD-10-CM | POA: Insufficient documentation

## 2017-04-28 LAB — COMPREHENSIVE METABOLIC PANEL
ALBUMIN: 4.1 g/dL (ref 3.5–5.0)
ALK PHOS: 61 U/L (ref 38–126)
ALT: 16 U/L (ref 14–54)
ANION GAP: 9 (ref 5–15)
AST: 23 U/L (ref 15–41)
BILIRUBIN TOTAL: 2.3 mg/dL — AB (ref 0.3–1.2)
BUN: 9 mg/dL (ref 6–20)
CALCIUM: 9.6 mg/dL (ref 8.9–10.3)
CO2: 24 mmol/L (ref 22–32)
CREATININE: 0.46 mg/dL (ref 0.44–1.00)
Chloride: 107 mmol/L (ref 101–111)
GFR calc non Af Amer: >60 mL/min (ref >60)
GLUCOSE: 97 mg/dL (ref 65–99)
Potassium: 3.7 mmol/L (ref 3.5–5.1)
SODIUM: 140 mmol/L (ref 135–145)
TOTAL PROTEIN: 7.2 g/dL (ref 6.5–8.1)

## 2017-04-28 LAB — CBC WITH DIFFERENTIAL/PLATELET
BASOS ABS: 0.1 10*3/uL (ref 0.0–0.1)
BASOS PCT: 1 %
EOS ABS: 0.5 10*3/uL (ref 0.0–0.7)
Eosinophils Relative: 5 %
HCT: 25.5 % — ABNORMAL LOW (ref 36.0–46.0)
Hemoglobin: 9 g/dL — ABNORMAL LOW (ref 12.0–15.0)
Lymphocytes Relative: 36 %
Lymphs Abs: 3.4 10*3/uL (ref 0.7–4.0)
MCH: 28.5 pg (ref 26.0–34.0)
MCHC: 35.3 g/dL (ref 30.0–36.0)
MCV: 80.7 fL (ref 78.0–100.0)
MONO ABS: 1.1 10*3/uL — AB (ref 0.1–1.0)
MONOS PCT: 12 %
NEUTROS ABS: 4.4 10*3/uL (ref 1.7–7.7)
Neutrophils Relative %: 46 %
PLATELETS: 469 10*3/uL — AB (ref 150–400)
RBC: 3.16 MIL/uL — ABNORMAL LOW (ref 3.87–5.11)
RDW: 15 % (ref 11.5–15.5)
WBC: 9.5 10*3/uL (ref 4.0–10.5)

## 2017-04-28 LAB — RETICULOCYTES
RBC.: 3.16 MIL/uL — AB (ref 3.87–5.11)
RETIC COUNT ABSOLUTE: 142.2 10*3/uL (ref 19.0–186.0)
RETIC CT PCT: 4.5 % — AB (ref 0.4–3.1)

## 2017-04-28 MED ORDER — HYDROMORPHONE HCL 1 MG/ML IJ SOLN: 2.0000 mg | INTRAMUSCULAR | Status: DC

## 2017-04-28 MED ORDER — DIPHENHYDRAMINE HCL 25 MG PO CAPS
25.0000 mg | ORAL_CAPSULE | ORAL | Status: DC | PRN
  Filled 2017-04-28: qty 1

## 2017-04-28 MED ORDER — HYDROMORPHONE HCL 4 MG/ML IJ SOLN
2.0000 mg | INTRAMUSCULAR | Status: AC
  Administered 2017-04-28: 2 mg via INTRAVENOUS
  Filled 2017-04-28: qty 1

## 2017-04-28 MED ORDER — HYDROMORPHONE HCL 1 MG/ML IJ SOLN: 2.0000 mg | INTRAMUSCULAR | Status: AC

## 2017-04-28 MED ORDER — HYDROMORPHONE HCL 4 MG/ML IJ SOLN: 2.0000 mg | INTRAMUSCULAR | Status: DC

## 2017-04-28 MED ORDER — KETOROLAC TROMETHAMINE 15 MG/ML IJ SOLN
15.0000 mg | INTRAMUSCULAR | Status: AC
  Administered 2017-04-28: 15 mg via INTRAVENOUS
  Filled 2017-04-28: qty 1

## 2017-04-28 MED ORDER — ONDANSETRON HCL 4 MG/2ML IJ SOLN
4.0000 mg | INTRAMUSCULAR | Status: DC | PRN
  Administered 2017-04-28: 4 mg via INTRAVENOUS
  Filled 2017-04-28: qty 2

## 2017-04-28 MED ORDER — HYDROMORPHONE HCL 4 MG/ML IJ SOLN: 2.0000 mg | INTRAMUSCULAR | Status: AC

## 2017-04-28 MED ORDER — HYDROMORPHONE HCL 1 MG/ML IJ SOLN
2.0000 mg | INTRAMUSCULAR | Status: AC
  Administered 2017-04-28: 2 mg via INTRAVENOUS
  Filled 2017-04-28: qty 2

## 2017-04-28 MED ORDER — HYDROMORPHONE HCL 1 MG/ML IJ SOLN
2.0000 mg | INTRAMUSCULAR | Status: DC
  Filled 2017-04-28: qty 2

## 2017-04-28 MED ORDER — HYDROMORPHONE HCL 1 MG/ML IJ SOLN
2.0000 mg | INTRAMUSCULAR | Status: AC
  Administered 2017-04-28: 2 mg via INTRAVENOUS

## 2017-04-28 NOTE — ED Triage Notes (Signed)
Pt reports a sickle cell pain crisis in both her arms and legs.  Pt denies fever, chills, n/v.  Pt report taking a percocet at 10pm with no relief.

## 2017-04-28 NOTE — ED Notes (Signed)
Attempted lab draw but unsuccessful. RN,Emily made aware.

## 2017-04-28 NOTE — ED Notes (Signed)
Delay in administering scheduled pain medication related to no stock in ED; pharmacy called and Lawana Pai will correct.

## 2017-04-28 NOTE — ED Provider Notes (Signed)
[facility] DEPT Provider Note   CSN: [id number] Arrival date & time: 04/28/17  0223     History   Chief Complaint Chief Complaint  Patient presents with  . Sickle Cell Pain Crisis    HPI Carmen [name] is a 26 y.o. female.  HPI Patient has a history of sickle cell disease and presents today with typical sickle cell pain.  She denies chest pain shortness of breath.  The majority of pain is in her low back and legs.  No dysuria or urinary frequency.  No fevers or chills.  Her pain is severe in severity despite her home medication including MS Contin and oxycodone.  Multiple recent visits the ER and a recent hospitalization for which left AMA.    Past Medical History:  Diagnosis Date  . Abortion    05/2012  . Headache(784.0)   . Sickle cell crisis [facility])     Patient Active Problem List   Diagnosis Date Noted  . 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 Sickle cell pain crisis ([place]) 03/19/2017  . Sickle cell anemia ([place]) 08/15/2016  . Breast lump 08/15/2016  . Anemia of chronic disease   . 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 S/P laparoscopic cholecystectomy 11/30/2014  . Hypokalemia 11/28/2014  . Leukocytosis 11/22/2014  . Thrombocytosis ([place]) 11/22/2014    Past Surgical History:  Procedure Laterality Date  . CHOLECYSTECTOMY N/A 11/30/2014   Procedure: LAPAROSCOPIC CHOLECYSTECTOMY SINGLE SITE WITH INTRAOPERATIVE CHOLANGIOGRAM;  Surgeon: Michael Boston, MD;  Location: [facility];  Service: General;  Laterality: N/A;  . SPLENECTOMY      OB History    Gravida Para Term Preterm AB Living   1       1     SAB TAB Ectopic Multiple Live Births                   Home Medications    Prior to Admission medications   Medication Sig Start Date End Date Taking? Authorizing Provider  morphine (MS CONTIN) 60 MG 12 hr tablet Take 60 mg by mouth 2 (two) times daily as needed for pain.    Yes [provider]  oxyCODONE-acetaminophen  (PERCOCET) 10-325 MG tablet Take 1 tablet by mouth every 4 (four) hours as needed for pain.   Yes [provider]    Family History Family History  Problem Relation Age of Onset  . Hypertension Mother   . Sickle cell anemia Sister   . Kidney disease Sister        Lupus  . Arthritis Sister   . Sickle cell anemia Sister   . Sickle cell trait Sister   . Heart disease Maternal Aunt        CABG  . Heart disease Maternal Uncle        CABG  . Lupus Sister     Social History Social History  Substance Use Topics  . Smoking status: Never Smoker  . Smokeless tobacco: Never Used  . Alcohol use No     Allergies   Patient has no known allergies.   Review of Systems Review of Systems  All other systems reviewed and are negative.    Physical Exam Updated Vital Signs BP 120/87   Pulse 85   Temp 98.2 F (36.8 C) (Oral)   Resp 16   Ht 5\' 3"  (1.6 m)   Wt 82.1 kg (181 lb)   LMP 04/09/2017   SpO2 100%  BMI 32.06 kg/m   Physical Exam  Constitutional: She is oriented to person, place, and time. She appears well-developed and well-nourished. No distress.  HENT:  Head: Normocephalic and atraumatic.  Eyes: EOM are normal.  Neck: Normal range of motion.  Cardiovascular: Normal rate and regular rhythm.   Pulmonary/Chest: Effort normal and breath sounds normal.  Abdominal: Soft. She exhibits no distension. There is no tenderness.  Musculoskeletal: Normal range of motion.  Neurological: She is alert and oriented to person, place, and time.  Skin: Skin is warm and dry.  Psychiatric: She has a normal mood and affect. Judgment normal.  Nursing note and vitals reviewed.    ED Treatments / Results  Labs (all labs ordered are listed, but only abnormal results are displayed) Labs Reviewed  CBC WITH DIFFERENTIAL/PLATELET - Abnormal; Notable for the following:       Result Value   RBC 3.16 (*)    Hemoglobin 9.0 (*)    HCT 25.5 (*)    Platelets 469 (*)    Monocytes  Absolute 1.1 (*)    All other components within normal limits  RETICULOCYTES - Abnormal; Notable for the following:    Retic Ct Pct 4.5 (*)    RBC. 3.16 (*)    All other components within normal limits  COMPREHENSIVE METABOLIC PANEL    EKG  EKG Interpretation None       Radiology No results found.  Procedures Procedures (including critical care time)  Medications Ordered in ED Medications  diphenhydrAMINE (BENADRYL) capsule 25-50 mg (not administered)  ondansetron (ZOFRAN) injection 4 mg (4 mg Intravenous Given 04/28/17 0611)  HYDROmorphone (DILAUDID) injection 2 mg (not administered)    Or  HYDROmorphone (DILAUDID) injection 2 mg (not administered)  ketorolac (TORADOL) 15 MG/ML injection 15 mg (15 mg Intravenous Given 04/28/17 0610)  HYDROmorphone (DILAUDID) injection 2 mg (2 mg Intravenous Given 04/28/17 0612)    Or  HYDROmorphone (DILAUDID) injection 2 mg ( Subcutaneous See Alternative 04/28/17 0612)  HYDROmorphone (DILAUDID) injection 2 mg (2 mg Intravenous Given 04/28/17 9983)    Or  HYDROmorphone (DILAUDID) injection 2 mg ( Subcutaneous See Alternative 04/28/17 3825)  HYDROmorphone (DILAUDID) injection 2 mg (2 mg Intravenous Given 04/28/17 0800)    Or  HYDROmorphone (DILAUDID) injection 2 mg ( Subcutaneous See Alternative 04/28/17 0800)     Initial Impression / Assessment and Plan / ED Course  I have reviewed the triage vital signs and the nursing notes.  Pertinent labs & imaging results that were available during my care of the patient were reviewed by me and considered in my medical decision making (see chart for details).     8:08 AM Pt is better at this time.  Discharge home with close primary care follow-up.  She understands return to the ER for new or worsening symptoms  Final Clinical Impressions(s) / ED Diagnoses   Final diagnoses:  Sickle cell pain crisis [facility])    New Prescriptions New Prescriptions   No medications on file     Jola Schmidt,  MD 04/28/17 [phone]

## 2017-05-07 ENCOUNTER — Emergency Department (HOSPITAL_COMMUNITY)
Admission: EM | Admit: 2017-05-07 | Discharge: 2017-05-07 | Disposition: A | Discharge status: Home / Self Care | Payer: Medicaid Other | Attending: Emergency Medicine | Admitting: Emergency Medicine

## 2017-05-07 ENCOUNTER — Encounter (HOSPITAL_COMMUNITY): Payer: Self-pay | Admitting: Emergency Medicine

## 2017-05-07 DIAGNOSIS — G8929 Other chronic pain: Secondary | ICD-10-CM | POA: Insufficient documentation

## 2017-05-07 DIAGNOSIS — D473 Essential (hemorrhagic) thrombocythemia: Secondary | ICD-10-CM | POA: Insufficient documentation

## 2017-05-07 DIAGNOSIS — D5700 Hb-SS disease with crisis, unspecified: Secondary | ICD-10-CM | POA: Diagnosis not present

## 2017-05-07 DIAGNOSIS — M545 Low back pain, unspecified: Secondary | ICD-10-CM

## 2017-05-07 DIAGNOSIS — R51 Headache: Secondary | ICD-10-CM | POA: Insufficient documentation

## 2017-05-07 DIAGNOSIS — D75839 Thrombocytosis, unspecified: Secondary | ICD-10-CM

## 2017-05-07 LAB — CBC WITH DIFFERENTIAL/PLATELET
Basophils Absolute: 0 10*3/uL (ref 0.0–0.1)
Basophils Relative: 0 %
Eosinophils Absolute: 0.1 10*3/uL (ref 0.0–0.7)
Eosinophils Relative: 1 %
HCT: 26.5 % — ABNORMAL LOW (ref 36.0–46.0)
Hemoglobin: 9.5 g/dL — ABNORMAL LOW (ref 12.0–15.0)
Lymphocytes Relative: 33 %
Lymphs Abs: 3.3 10*3/uL (ref 0.7–4.0)
MCH: 29.4 pg (ref 26.0–34.0)
MCHC: 35.8 g/dL (ref 30.0–36.0)
MCV: 82 fL (ref 78.0–100.0)
Monocytes Absolute: 1.2 10*3/uL — ABNORMAL HIGH (ref 0.1–1.0)
Monocytes Relative: 12 %
Neutro Abs: 5.4 10*3/uL (ref 1.7–7.7)
Neutrophils Relative %: 54 %
Platelets: 626 10*3/uL — ABNORMAL HIGH (ref 150–400)
RBC: 3.23 MIL/uL — ABNORMAL LOW (ref 3.87–5.11)
RDW: 15.6 % — ABNORMAL HIGH (ref 11.5–15.5)
WBC: 10 10*3/uL (ref 4.0–10.5)

## 2017-05-07 LAB — COMPREHENSIVE METABOLIC PANEL
ALT: 14 U/L (ref 14–54)
AST: 20 U/L (ref 15–41)
Albumin: 3.9 g/dL (ref 3.5–5.0)
Alkaline Phosphatase: 52 U/L (ref 38–126)
Anion gap: 6 (ref 5–15)
BUN: 7 mg/dL (ref 6–20)
CO2: 24 mmol/L (ref 22–32)
Calcium: 9 mg/dL (ref 8.9–10.3)
Chloride: 108 mmol/L (ref 101–111)
Creatinine, Ser: 0.45 mg/dL (ref 0.44–1.00)
GFR calc Af Amer: >60 mL/min (ref >60)
GFR calc non Af Amer: >60 mL/min (ref >60)
Glucose, Bld: 91 mg/dL (ref 65–99)
Potassium: 3.7 mmol/L (ref 3.5–5.1)
Sodium: 138 mmol/L (ref 135–145)
Total Bilirubin: 1.4 mg/dL — ABNORMAL HIGH (ref 0.3–1.2)
Total Protein: 7.3 g/dL (ref 6.5–8.1)

## 2017-05-07 LAB — LACTATE DEHYDROGENASE: LDH: 210 U/L — AB (ref 98–192)

## 2017-05-07 LAB — URINALYSIS, ROUTINE W REFLEX MICROSCOPIC
BACTERIA UA: NONE SEEN
Bilirubin Urine: NEGATIVE
GLUCOSE, UA: NEGATIVE mg/dL
Ketones, ur: NEGATIVE mg/dL
Leukocytes, UA: NEGATIVE
Nitrite: NEGATIVE
PROTEIN: NEGATIVE mg/dL
Specific Gravity, Urine: 1.009 (ref 1.005–1.030)
pH: 7 (ref 5.0–8.0)

## 2017-05-07 LAB — RETICULOCYTES
RBC.: 3.23 MIL/uL — ABNORMAL LOW (ref 3.87–5.11)
Retic Count, Absolute: 242.3 10*3/uL — ABNORMAL HIGH (ref 19.0–186.0)
Retic Ct Pct: 7.5 % — ABNORMAL HIGH (ref 0.4–3.1)

## 2017-05-07 LAB — I-STAT BETA HCG BLOOD, ED (MC, WL, AP ONLY)

## 2017-05-07 MED ORDER — DIPHENHYDRAMINE HCL 25 MG PO CAPS
25.0000 mg | ORAL_CAPSULE | ORAL | Status: DC | PRN
  Administered 2017-05-07: 25 mg via ORAL
  Filled 2017-05-07: qty 1

## 2017-05-07 MED ORDER — DEXTROSE-NACL 5-0.45 % IV SOLN
INTRAVENOUS | Status: DC
  Administered 2017-05-07: 16:00:00 via INTRAVENOUS

## 2017-05-07 MED ORDER — HYDROMORPHONE HCL 2 MG/ML IJ SOLN: 2.0000 mg | INTRAMUSCULAR | Status: AC

## 2017-05-07 MED ORDER — ONDANSETRON HCL 4 MG/2ML IJ SOLN
4.0000 mg | INTRAMUSCULAR | Status: DC | PRN
  Administered 2017-05-07: 4 mg via INTRAVENOUS
  Filled 2017-05-07: qty 2

## 2017-05-07 MED ORDER — HYDROMORPHONE HCL 2 MG/ML IJ SOLN
2.0000 mg | INTRAMUSCULAR | Status: AC
  Administered 2017-05-07: 2 mg via INTRAVENOUS
  Filled 2017-05-07: qty 1

## 2017-05-07 MED ORDER — KETOROLAC TROMETHAMINE 30 MG/ML IJ SOLN
30.0000 mg | INTRAMUSCULAR | Status: AC
  Administered 2017-05-07: 30 mg via INTRAVENOUS
  Filled 2017-05-07: qty 1

## 2017-05-07 NOTE — Discharge Instructions (Signed)
Your labs are reassuring. Stay well hydrated. May consider using heat to your back to help with back pain. Use your home pain medications to help with pain. Don't drive while taking narcotics. Follow up with your regular doctor in 3-5 days for recheck of symptoms. Return to the ER for emergent changes or worsening symptoms.

## 2017-05-07 NOTE — ED Provider Notes (Signed)
[facility] DEPT Provider Note   CSN: [id number] Arrival date & time: 05/07/17  1433     History   Chief Complaint Chief Complaint  Patient presents with  . Sickle Cell Pain Crisis    HPI Carmen [name] is a 26 y.o. female with a PMHx of sickle cell anemia, headaches, thrombocytosis, elevated LFTS, and other medical problems listed below, and PSHx of splenectomy and cholecystectomy, who presents to the ED with complaints of one day of sickle cell pain crisis she states is throughout her entire back and today has become generalized throughout her entire body. She states that this is the same as her prior sickle cell pain crises. She describes the pain as 10/10 constant sharp and throbbing generalized back pain that radiates throughout her entire body, worse with movement, and mildly improved with Percocet yesterday however she ran out last night, and unrelieved with morphine ER 60 mg she took at 9 AM. She denies fevers, chills, cough, URI symptoms, LE swelling, CP, SOB, abd pain, N/V/D/C, hematuria, dysuria, incontinence of urine/stool, saddle anesthesia/cauda equina symptoms, leg ulcers, rashes, numbness, tingling, focal weakness, or any other complaints at this time. She is a nonsmoker. Denies recent travel or elevation changes. LMP started yesterday. PCP is Dr. Alyson Ingles.   The history is provided by the patient and medical records. No language interpreter was used.  Sickle Cell Pain Crisis  Location:  Diffuse Severity:  Moderate Onset quality:  Gradual Duration:  1 day Similar to previous crisis episodes: yes   Timing:  Constant Progression:  Unchanged Chronicity:  Recurrent Usual hemoglobin level:  9 Frequency of attacks:  Often History of pulmonary emboli: no   Context: menses   Context: not infection   Relieved by:  Prescription drugs (percocet, slightly; but ran out) Worsened by:  Movement Ineffective treatments:  Prescription drugs (morphine ER 60mg ) Associated symptoms:  no chest pain, no cough, no fever, no leg ulcers, no nausea, no shortness of breath, no sore throat, no swelling of legs and no vomiting   Risk factors: cholecystectomy and frequent pain crises   Risk factors: no recent air travel and no smoking     Past Medical History:  Diagnosis Date  . Abortion    05/2012  . Headache(784.0)   . Sickle cell crisis [facility])     Patient Active Problem List   Diagnosis Date Noted  . 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 Sickle cell pain crisis ([place]) 03/19/2017  . Sickle cell anemia ([place]) 08/15/2016  . Breast lump 08/15/2016  . Anemia of chronic disease   . 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 S/P laparoscopic cholecystectomy 11/30/2014  . Hypokalemia 11/28/2014  . Leukocytosis 11/22/2014  . Thrombocytosis ([place]) 11/22/2014    Past Surgical History:  Procedure Laterality Date  . CHOLECYSTECTOMY N/A 11/30/2014   Procedure: LAPAROSCOPIC CHOLECYSTECTOMY SINGLE SITE WITH INTRAOPERATIVE CHOLANGIOGRAM;  Surgeon: Michael Boston, MD;  Location: [facility];  Service: General;  Laterality: N/A;  . SPLENECTOMY      OB History    Gravida Para Term Preterm AB Living   1       1     SAB TAB Ectopic Multiple Live Births                   Home Medications    Prior to Admission medications   Medication Sig Start Date End Date Taking? Authorizing Provider  morphine (MS CONTIN) 60 MG 12 hr tablet Take  60 mg by mouth 2 (two) times daily as needed for pain.     [provider]  oxyCODONE-acetaminophen (PERCOCET) 10-325 MG tablet Take 1 tablet by mouth every 4 (four) hours as needed for pain.    [provider]    Family History Family History  Problem Relation Age of Onset  . Hypertension Mother   . Sickle cell anemia Sister   . Kidney disease Sister        Lupus  . Arthritis Sister   . Sickle cell anemia Sister   . Sickle cell trait Sister   . Heart disease Maternal Aunt        CABG  . Heart  disease Maternal Uncle        CABG  . Lupus Sister     Social History Social History  Substance Use Topics  . Smoking status: Never Smoker  . Smokeless tobacco: Never Used  . Alcohol use No     Allergies   Patient has no known allergies.   Review of Systems Review of Systems  Constitutional: Negative for chills and fever.  HENT: Negative for rhinorrhea and sore throat.   Respiratory: Negative for cough and shortness of breath.   Cardiovascular: Negative for chest pain and leg swelling.  Gastrointestinal: Negative for abdominal pain, constipation, diarrhea, nausea and vomiting.  Genitourinary: Negative for difficulty urinating (no incontinence), dysuria and hematuria.  Musculoskeletal: Positive for back pain and myalgias.  Skin: Negative for color change and rash.  Allergic/Immunologic: Negative for immunocompromised state.  Neurological: Negative for weakness and numbness.  Psychiatric/Behavioral: Negative for confusion.   All other systems reviewed and are negative for acute change except as noted in the HPI.    Physical Exam Updated Vital Signs BP 124/82 (BP Location: Left Arm)   Pulse 84   Temp 98.9 F (37.2 C) (Oral)   Resp 14   Ht 5\' 3"  (1.6 m)   Wt 81.6 kg (180 lb)   LMP 05/05/2017   SpO2 97%   BMI 31.89 kg/m   Physical Exam  Constitutional: She is oriented to person, place, and time. Vital signs are normal. She appears well-developed and well-nourished.  Non-toxic appearance. No distress.  Afebrile, nontoxic, NAD  HENT:  Head: Normocephalic and atraumatic.  Mouth/Throat: Oropharynx is clear and moist and mucous membranes are normal.  Eyes: Conjunctivae and EOM are normal. Right eye exhibits no discharge. Left eye exhibits no discharge.  Neck: Normal range of motion. Neck supple.  Cardiovascular: Normal rate, regular rhythm, normal heart sounds and intact distal pulses.  Exam reveals no gallop and no friction rub.   No murmur heard. Pulmonary/Chest:  Effort normal and breath sounds normal. No respiratory distress. She has no decreased breath sounds. She has no wheezes. She has no rhonchi. She has no rales.  Abdominal: Soft. Normal appearance and bowel sounds are normal. She exhibits no distension. There is no tenderness. There is no rigidity, no rebound, no guarding, no CVA tenderness, no tenderness at McBurney's point and negative Murphy's sign.  Musculoskeletal: Normal range of motion.  MAE x4 Strength and sensation grossly intact in all extremities Distal pulses intact Gait steady No pedal edema, neg homan's bilaterally   Neurological: She is alert and oriented to person, place, and time. She has normal strength. No sensory deficit.  Skin: Skin is warm, dry and intact. No rash noted.  Psychiatric: She has a normal mood and affect.  Nursing note and vitals reviewed.    ED Treatments / Results  Labs (all labs ordered are listed, but only abnormal results are displayed) Labs Reviewed  COMPREHENSIVE METABOLIC PANEL - Abnormal; Notable for the following:       Result Value   Total Bilirubin 1.4 (*)    All other components within normal limits  CBC WITH DIFFERENTIAL/PLATELET - Abnormal; Notable for the following:    RBC 3.23 (*)    Hemoglobin 9.5 (*)    HCT 26.5 (*)    RDW 15.6 (*)    Platelets 626 (*)    Monocytes Absolute 1.2 (*)    All other components within normal limits  RETICULOCYTES - Abnormal; Notable for the following:    Retic Ct Pct 7.5 (*)    RBC. 3.23 (*)    Retic Count, Absolute 242.3 (*)    All other components within normal limits  URINALYSIS, ROUTINE W REFLEX MICROSCOPIC - Abnormal; Notable for the following:    Hgb urine dipstick LARGE (*)    Squamous Epithelial / LPF 0-5 (*)    All other components within normal limits  LACTATE DEHYDROGENASE - Abnormal; Notable for the following:    LDH 210 (*)    All other components within normal limits  I-STAT BETA HCG BLOOD, ED ([facility], [facility], [facility] ONLY)    EKG  EKG  Interpretation None       Radiology No results found.  Procedures Procedures (including critical care time)  Medications Ordered in ED Medications  dextrose 5 %-0.45 % sodium chloride infusion ( Intravenous New Bag/Given 05/07/17 1620)  diphenhydrAMINE (BENADRYL) capsule 25-50 mg (25 mg Oral Given 05/07/17 1620)  ondansetron (ZOFRAN) injection 4 mg (4 mg Intravenous Given 05/07/17 1621)  ketorolac (TORADOL) 30 MG/ML injection 30 mg (30 mg Intravenous Given 05/07/17 1622)  HYDROmorphone (DILAUDID) injection 2 mg (2 mg Intravenous Given 05/07/17 1621)    Or  HYDROmorphone (DILAUDID) injection 2 mg ( Subcutaneous See Alternative 05/07/17 1621)  HYDROmorphone (DILAUDID) injection 2 mg (2 mg Intravenous Given 05/07/17 1652)    Or  HYDROmorphone (DILAUDID) injection 2 mg ( Subcutaneous See Alternative 05/07/17 1652)  HYDROmorphone (DILAUDID) injection 2 mg (2 mg Intravenous Given 05/07/17 1731)    Or  HYDROmorphone (DILAUDID) injection 2 mg ( Subcutaneous See Alternative 05/07/17 1731)     Initial Impression / Assessment and Plan / ED Course  I have reviewed the triage vital signs and the nursing notes.  Pertinent labs & imaging results that were available during my care of the patient were reviewed by me and considered in my medical decision making (see chart for details).     26 y.o. female here with sickle cell pain in her back and generalized all over. Similar to prior sickle cell pain. On exam, well appearing, in NAD, no focal areas of tenderness appreciated, clear lungs, no abdominal tenderness. Will get labs and give pain meds and fluids, will reassess shortly. Doubt need for imaging.   6:06 PM CBC w/diff with stable anemia and chronic stable thrombocytosis. CMP with mildly elevated bili 1.4 which is stable. BetaHCG neg. Retics elevated, slightly more than prior visits. LDH elevated but actually better than last month. U/A unremarkable without evidence of infection. Pt feeling much better, pain  improving. Advised staying hydrating, using home pain meds, f/up with PCP in 3-5 days for recheck and ongoing management. I explained the diagnosis and have given explicit precautions to return to the ER including for any other new or worsening symptoms. The patient understands and accepts the medical plan as it's been dictated and  I have answered their questions. Discharge instructions concerning home care and prescriptions have been given. The patient is STABLE and is discharged to home in good condition.    Final Clinical Impressions(s) / ED Diagnoses   Final diagnoses:  Sickle cell anemia with pain ([place])  Chronic bilateral low back pain without sciatica  Thrombocytosis ([facility])  Hyperbilirubinemia    New Prescriptions New Prescriptions   No medications on [address], [place], [place] 05/07/17 1806    Virgel Manifold, MD 05/07/17 1810

## 2017-05-07 NOTE — ED Triage Notes (Signed)
Pt from home with c/o sickle cell pain in her "entire body" mostly in her back. pt denies SOB, chills, or fever. Pt rates pain 10/10. Pt states pain began yesterday. Pt states she just has extended morphine at home and she last took that at 0930 this morning with minimal relief.

## 2017-05-08 ENCOUNTER — Emergency Department (HOSPITAL_COMMUNITY)
Admission: EM | Admit: 2017-05-08 | Discharge: 2017-05-08 | Disposition: A | Discharge status: Home / Self Care | Payer: Medicaid Other | Attending: Emergency Medicine | Admitting: Emergency Medicine

## 2017-05-08 ENCOUNTER — Encounter (HOSPITAL_COMMUNITY): Payer: Self-pay

## 2017-05-08 DIAGNOSIS — M549 Dorsalgia, unspecified: Secondary | ICD-10-CM | POA: Diagnosis present

## 2017-05-08 DIAGNOSIS — D5700 Hb-SS disease with crisis, unspecified: Secondary | ICD-10-CM

## 2017-05-08 MED ORDER — HYDROMORPHONE HCL 2 MG/ML IJ SOLN: 2.0000 mg | INTRAMUSCULAR | Status: AC

## 2017-05-08 MED ORDER — DEXTROSE-NACL 5-0.45 % IV SOLN
INTRAVENOUS | Status: DC
  Administered 2017-05-08: 19:00:00 via INTRAVENOUS

## 2017-05-08 MED ORDER — HYDROMORPHONE HCL 2 MG/ML IJ SOLN
2.0000 mg | INTRAMUSCULAR | Status: AC
  Administered 2017-05-08: 2 mg via INTRAVENOUS
  Filled 2017-05-08: qty 1

## 2017-05-08 MED ORDER — DIPHENHYDRAMINE HCL 50 MG/ML IJ SOLN
25.0000 mg | Freq: Once | INTRAMUSCULAR | Status: AC
  Administered 2017-05-08: 25 mg via INTRAVENOUS
  Filled 2017-05-08: qty 1

## 2017-05-08 MED ORDER — KETOROLAC TROMETHAMINE 30 MG/ML IJ SOLN
30.0000 mg | INTRAMUSCULAR | Status: AC
  Administered 2017-05-08: 30 mg via INTRAVENOUS
  Filled 2017-05-08: qty 1

## 2017-05-08 MED ORDER — OXYCODONE-ACETAMINOPHEN 5-325 MG PO TABS: 2.0000 | ORAL_TABLET | ORAL | 0 refills | Status: DC | PRN

## 2017-05-08 MED ORDER — OXYCODONE-ACETAMINOPHEN 5-325 MG PO TABS
2.0000 | ORAL_TABLET | Freq: Once | ORAL | Status: AC
  Administered 2017-05-08: 2 via ORAL
  Filled 2017-05-08: qty 2

## 2017-05-08 MED ORDER — ONDANSETRON HCL 4 MG/2ML IJ SOLN
4.0000 mg | INTRAMUSCULAR | Status: DC | PRN
  Administered 2017-05-08: 4 mg via INTRAVENOUS
  Filled 2017-05-08: qty 2

## 2017-05-08 NOTE — ED Provider Notes (Signed)
[facility] DEPT Provider Note   CSN: [id number] Arrival date & time: 05/08/17  1412     History   Chief Complaint Chief Complaint  Patient presents with  . Sickle Cell Pain Crisis  . Back Pain    HPI Carmen [name] is a 26 y.o. female.  HPI Patient was seen yesterday for sickle cell crisis. Treated with IV pain medication with significant improvement of her symptoms. Had blood work performed at that time. Patient states she only has her extended release narcotic medication at home and is out of her Percocet. She represents with continued pain especially in her back, bilateral upper and lower extremities. Denies any chest pain, cough, fever or chills. Says she's been trying to stay hydrated. No nausea, vomiting or diarrhea. She says she has an appointment to see her primary physician tomorrow for refill of her prescription. Past Medical History:  Diagnosis Date  . Abortion    05/2012  . Headache(784.0)   . Sickle cell crisis [facility])     Patient Active Problem List   Diagnosis Date Noted  . 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 Sickle cell pain crisis ([place]) 03/19/2017  . Sickle cell anemia ([place]) 08/15/2016  . Breast lump 08/15/2016  . Anemia of chronic disease   . 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 S/P laparoscopic cholecystectomy 11/30/2014  . Hypokalemia 11/28/2014  . Leukocytosis 11/22/2014  . Thrombocytosis ([place]) 11/22/2014    Past Surgical History:  Procedure Laterality Date  . CHOLECYSTECTOMY N/A 11/30/2014   Procedure: LAPAROSCOPIC CHOLECYSTECTOMY SINGLE SITE WITH INTRAOPERATIVE CHOLANGIOGRAM;  Surgeon: Michael Boston, MD;  [facility];  Service: General;  Laterality: N/A;  . SPLENECTOMY      OB History    Gravida Para Term Preterm AB Living   1       1     SAB TAB Ectopic Multiple Live Births                   Home Medications    Prior to Admission medications   Medication Sig Start Date End Date Taking?  Authorizing Provider  morphine (MS CONTIN) 60 MG 12 hr tablet Take 60 mg by mouth 2 (two) times daily as needed for pain.    Yes [provider]  oxyCODONE-acetaminophen (PERCOCET) 5-325 MG tablet Take 2 tablets by mouth every 4 (four) hours as needed. 05/08/17   Julianne Rice, MD    Family History Family History  Problem Relation Age of Onset  . Hypertension Mother   . Sickle cell anemia Sister   . Kidney disease Sister        Lupus  . Arthritis Sister   . Sickle cell anemia Sister   . Sickle cell trait Sister   . Heart disease Maternal Aunt        CABG  . Heart disease Maternal Uncle        CABG  . Lupus Sister     Social History Social History  Substance Use Topics  . Smoking status: Never Smoker  . Smokeless tobacco: Never Used  . Alcohol use No     Allergies   Patient has no known allergies.   Review of Systems Review of Systems  Constitutional: Negative for chills and fever.  HENT: Negative for trouble swallowing and voice change.   Eyes: Negative for photophobia and visual disturbance.  Respiratory: Negative for cough, chest tightness and shortness of breath.   Cardiovascular: Negative for  chest pain, palpitations and leg swelling.  Gastrointestinal: Negative for abdominal pain, constipation, diarrhea, nausea and vomiting.  Genitourinary: Negative for dysuria, flank pain and frequency.  Musculoskeletal: Positive for back pain and myalgias. Negative for arthralgias, neck pain and neck stiffness.  Skin: Negative for rash and wound.  Neurological: Negative for dizziness, weakness, light-headedness, numbness and headaches.  All other systems reviewed and are negative.    Physical Exam Updated Vital Signs BP 110/71 (BP Location: Right Arm)   Pulse 70   Temp 98.8 F (37.1 C) (Oral)   Resp 18   LMP 05/05/2017   SpO2 100%   Physical Exam  Constitutional: She is oriented to person, place, and time. She appears well-developed and well-nourished.    HENT:  Head: Normocephalic and atraumatic.  Mouth/Throat: Oropharynx is clear and moist. No oropharyngeal exudate.  Eyes: EOM are normal. Pupils are equal, round, and reactive to light.  Neck: Normal range of motion. Neck supple.  No meningismus. No cervical lymphadenopathy.  Cardiovascular: Normal rate and regular rhythm.  Exam reveals no gallop and no friction rub.   No murmur heard. Pulmonary/Chest: Effort normal and breath sounds normal. No respiratory distress. She has no wheezes. She has no rales. She exhibits no tenderness.  Abdominal: Soft. Bowel sounds are normal. There is no tenderness. There is no rebound and no guarding.  Musculoskeletal: Normal range of motion. She exhibits tenderness. She exhibits no edema.  Mild diffuse thoracic and lumbar muscular tenderness. No focal midline thoracic or lumbar tenderness. No extremity swelling. 2+ distal pulses in all extremities.  Neurological: She is alert and oriented to person, place, and time.  Patient is alert and oriented x3 with clear, goal oriented speech. Patient has 5/5 motor in all extremities. Sensation is intact to light touch.  Skin: Skin is warm and dry. No rash noted. No erythema.  Psychiatric: She has a normal mood and affect. Her behavior is normal.  Nursing note and vitals reviewed.    ED Treatments / Results  Labs (all labs ordered are listed, but only abnormal results are displayed) Labs Reviewed - No data to display  EKG  EKG Interpretation None       Radiology No results found.  Procedures Procedures (including critical care time)  Medications Ordered in ED Medications  dextrose 5 %-0.45 % sodium chloride infusion ( Intravenous New Bag/Given 05/08/17 1857)  ondansetron (ZOFRAN) injection 4 mg (4 mg Intravenous Given 05/08/17 1858)  ketorolac (TORADOL) 30 MG/ML injection 30 mg (30 mg Intravenous Given 05/08/17 1857)  HYDROmorphone (DILAUDID) injection 2 mg (2 mg Intravenous Given 05/08/17 1858)    Or   HYDROmorphone (DILAUDID) injection 2 mg ( Subcutaneous See Alternative 05/08/17 1858)  HYDROmorphone (DILAUDID) injection 2 mg (2 mg Intravenous Given 05/08/17 1936)    Or  HYDROmorphone (DILAUDID) injection 2 mg ( Subcutaneous See Alternative 05/08/17 1936)  HYDROmorphone (DILAUDID) injection 2 mg (2 mg Intravenous Given 05/08/17 2007)    Or  HYDROmorphone (DILAUDID) injection 2 mg ( Subcutaneous See Alternative 05/08/17 2007)  diphenhydrAMINE (BENADRYL) injection 25 mg (25 mg Intravenous Given 05/08/17 1858)  oxyCODONE-acetaminophen (PERCOCET/ROXICET) 5-325 MG per tablet 2 tablet (2 tablets Oral Given 05/08/17 2024)     Initial Impression / Assessment and Plan / ED Course  I have reviewed the triage vital signs and the nursing notes.  Pertinent labs & imaging results that were available during my care of the patient were reviewed by me and considered in my medical decision making (see chart for details).  Do not believe that labs and be repeated at this point. Will treat with sickle cell protocol but anticipate discharge home. Patient states she is feeling better after pain medication. Stable vital signs. Discharge home to follow-up with her primary physician tomorrow. Given prescription for several Percocet until she can see her doctor. Final Clinical Impressions(s) / ED Diagnoses   Final diagnoses:  Sickle cell pain crisis (HCC)    New Prescriptions New Prescriptions   OXYCODONE-ACETAMINOPHEN (PERCOCET) 5-325 MG TABLET    Take 2 tablets by mouth every 4 (four) hours as needed.     Julianne Rice, MD 05/08/17 2116

## 2017-05-08 NOTE — ED Triage Notes (Signed)
Pt with hx sickle cell.  Pt with back pain x 2 days.  Here yesterday.  Pain not relieving with home meds.

## 2017-05-09 ENCOUNTER — Emergency Department (HOSPITAL_COMMUNITY)
Admission: EM | Admit: 2017-05-09 | Discharge: 2017-05-09 | Disposition: A | Discharge status: Other Acute Inpatient Hospital | Payer: Medicaid Other | Attending: Emergency Medicine | Admitting: Emergency Medicine

## 2017-05-09 ENCOUNTER — Non-Acute Institutional Stay (HOSPITAL_COMMUNITY)
Admission: AD | Admit: 2017-05-09 | Discharge: 2017-05-09 | Disposition: A | Discharge status: Home / Self Care | Payer: Medicaid Other | Source: Ambulatory Visit | Attending: Internal Medicine | Admitting: Internal Medicine

## 2017-05-09 ENCOUNTER — Encounter (HOSPITAL_COMMUNITY): Payer: Self-pay | Admitting: *Deleted

## 2017-05-09 ENCOUNTER — Encounter (HOSPITAL_COMMUNITY): Payer: Self-pay | Admitting: Emergency Medicine

## 2017-05-09 DIAGNOSIS — D5700 Hb-SS disease with crisis, unspecified: Secondary | ICD-10-CM | POA: Insufficient documentation

## 2017-05-09 DIAGNOSIS — R52 Pain, unspecified: Secondary | ICD-10-CM | POA: Diagnosis present

## 2017-05-09 MED ORDER — HYDROMORPHONE HCL 4 MG/ML IJ SOLN
2.0000 mg | Freq: Once | INTRAMUSCULAR | Status: AC
  Administered 2017-05-09: 2 mg via INTRAVENOUS
  Filled 2017-05-09: qty 1

## 2017-05-09 MED ORDER — HYDROMORPHONE HCL 4 MG/ML IJ SOLN
2.0000 mg | Freq: Once | INTRAMUSCULAR | Status: AC
Start: 2017-05-09 — End: 2017-05-09
  Administered 2017-05-09: 2 mg via INTRAVENOUS
  Filled 2017-05-09: qty 1

## 2017-05-09 MED ORDER — KETOROLAC TROMETHAMINE 30 MG/ML IJ SOLN
30.0000 mg | Freq: Once | INTRAMUSCULAR | Status: AC
  Administered 2017-05-09: 30 mg via INTRAVENOUS
  Filled 2017-05-09: qty 1

## 2017-05-09 MED ORDER — HYDROMORPHONE HCL 1 MG/ML IJ SOLN
0.5000 mg | Freq: Once | INTRAMUSCULAR | Status: AC
  Administered 2017-05-09: 0.5 mg via SUBCUTANEOUS
  Filled 2017-05-09: qty 1

## 2017-05-09 MED ORDER — HYDROMORPHONE HCL 4 MG/ML IJ SOLN: 2.0000 mg | Freq: Once | INTRAMUSCULAR | Status: DC

## 2017-05-09 MED ORDER — DEXTROSE-NACL 5-0.45 % IV SOLN
INTRAVENOUS | Status: DC
  Administered 2017-05-09: 10:00:00 via INTRAVENOUS

## 2017-05-09 NOTE — ED Triage Notes (Signed)
Patient c/o generalized sickle cell pain that been on-giong for about 3 days. Patient reports that she was here last night and then pain was started again at 3 am when she woke up.

## 2017-05-09 NOTE — Progress Notes (Signed)
Pt received to the Patient Carmen [name] for treatment .Pt states her pain is 10/10 and it is generalized. Pt was treated with IV push Dilaudid times 3, IV fluids and Toradol. Pt's pain # was 8/10 at discharge. Pt was alert,oriented and ambulatory at discharge. D/C instructions given with verbal understanding.

## 2017-05-09 NOTE — ED Provider Notes (Signed)
[place] DEPT Provider Note   CSN: [id number] Arrival date & time: 05/09/17  0730     History   Chief Complaint Chief Complaint  Patient presents with  . Sickle Cell Pain Crisis  . Generalized Body Aches    HPI Carmen [name] is a 26 y.o. female.  HPI Patient presents with sickle cell pain. Spin in her arms and legs. History of chronic sickle cell pain. This is her third visit in 3 days. It is in her arms legs and backs. States she was feeling better after the visits here just enough to go home but then the pain came back. Has a appointment with her primary care doctor later today. No fevers. No chest pain. No cough. She had run out of her medial release pain medicines and was given a prescription for a refill of 6 pills last night. States she was not able to get this filled because it was too late.   Past Medical History:  Diagnosis Date  . Abortion    05/2012  . Headache(784.0)   . Sickle cell crisis [facility])     Patient Active Problem List   Diagnosis Date Noted  . 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 Sickle cell pain crisis ([place]) 03/19/2017  . Sickle cell anemia ([place]) 08/15/2016  . Breast lump 08/15/2016  . Anemia of chronic disease   . 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 S/P laparoscopic cholecystectomy 11/30/2014  . Hypokalemia 11/28/2014  . Leukocytosis 11/22/2014  . Thrombocytosis ([place]) 11/22/2014    Past Surgical History:  Procedure Laterality Date  . CHOLECYSTECTOMY N/A 11/30/2014   Procedure: LAPAROSCOPIC CHOLECYSTECTOMY SINGLE SITE WITH INTRAOPERATIVE CHOLANGIOGRAM;  Surgeon: Michael Boston, MD;  [facility];  Service: General;  Laterality: N/A;  . SPLENECTOMY      OB History    Gravida Para Term Preterm AB Living   1       1     SAB TAB Ectopic Multiple Live Births                   Home Medications    Prior to Admission medications   Medication Sig Start Date End Date Taking? Authorizing Provider    morphine (MS CONTIN) 60 MG 12 hr tablet Take 60 mg by mouth 2 (two) times daily as needed for pain.     [provider]  oxyCODONE-acetaminophen (PERCOCET) 5-325 MG tablet Take 2 tablets by mouth every 4 (four) hours as needed. 05/08/17   Julianne Rice, MD    Family History Family History  Problem Relation Age of Onset  . Hypertension Mother   . Sickle cell anemia Sister   . Kidney disease Sister        Lupus  . Arthritis Sister   . Sickle cell anemia Sister   . Sickle cell trait Sister   . Heart disease Maternal Aunt        CABG  . Heart disease Maternal Uncle        CABG  . Lupus Sister     Social History Social History  Substance Use Topics  . Smoking status: Never Smoker  . Smokeless tobacco: Never Used  . Alcohol use No     Allergies   Patient has no known allergies.   Review of Systems Review of Systems  Constitutional: Negative for appetite change.  Respiratory: Negative for shortness of breath.   Cardiovascular: Negative for chest pain.  Genitourinary: Negative for  flank pain.  Musculoskeletal: Negative for neck stiffness.  Skin: Negative for pallor and rash.  Neurological: Negative for seizures.  Psychiatric/Behavioral: Negative for confusion.     Physical Exam Updated Vital Signs BP 124/80 (BP Location: Right Arm)   Pulse 85   Temp 98 F (36.7 C) (Oral)   Resp 18   Ht 5\' 3"  (1.6 m)   Wt 81.6 kg (180 lb)   LMP 05/05/2017   SpO2 96%   BMI 31.89 kg/m   Physical Exam  Constitutional: She appears well-developed.  Patient is sitting up in bed.  HENT:  Head: Atraumatic.  Neck: Neck supple.  Cardiovascular: Normal rate.   Pulmonary/Chest: Effort normal.  Abdominal: There is no tenderness.  Musculoskeletal: She exhibits no edema.  Neurological: She is alert.  Skin: Skin is warm. No rash noted.  Psychiatric: She has a normal mood and affect.     ED Treatments / Results  Labs (all labs ordered are listed, but only abnormal  results are displayed) Labs Reviewed  COMPREHENSIVE METABOLIC PANEL  CBC WITH DIFFERENTIAL/PLATELET  RETICULOCYTES    EKG  EKG Interpretation None       Radiology No results found.  Procedures Procedures (including critical care time)  Medications Ordered in ED Medications  HYDROmorphone (DILAUDID) injection 0.5 mg (0.5 mg Subcutaneous Given 05/09/17 0749)     Initial Impression / Assessment and Plan / ED Course  I have reviewed the triage vital signs and the nursing notes.  Pertinent labs & imaging results that were available during my care of the patient were reviewed by me and considered in my medical decision making (see chart for details).     Patient with acute on chronic sickle cell pain. Has been out of some of her medications. Third visit in 3 days. Discussed with [facility] and she will be transferred for specialty care.  Final Clinical Impressions(s) / ED Diagnoses   Final diagnoses:  Sickle cell pain crisis [facility])    New Prescriptions New Prescriptions   No medications on file     Davonna Belling, MD 05/09/17 [phone]

## 2017-05-09 NOTE — H&P (Signed)
[facility] History and Physical   Date: 05/09/2017  Patient name: Carmen [name] Medical record number: [id number] Date of birth: 07-17-1991 Age: 26 y.o. Gender: female PCP: [name], MD  Attending physician: Tresa Garter, MD  Chief Complaint: Generalized pain  History of Present Illness: Carmen [name], a 26 year old female with a history of sickle cell anemia presents complaining of generalized pain that is consistent with her typical sickle cell crisis. She was triaged in the emergency department this am. I spoke with Dr. Davonna Belling and agree that she is appropriate for transitioning to the day infusion center for pain management.  She says that she has been having pain for greater than 1 week. She says that pain intensity is 10/10 described as constant and throbbing. She says that she is out of her home medications. She was given a prescription in the emergency department on last night, but pharmacy was closed on discharge. She has an appointment with her primary provider, Dr. [name] on today at 3 pm. She currently denies headache, chest pains, shortness of breath, abdominal pain, nausea, vomiting or diarrhea.   Meds: Prescriptions Prior to Admission  Medication Sig Dispense Refill Last Dose  . morphine (MS CONTIN) 60 MG 12 hr tablet Take 60 mg by mouth 2 (two) times daily as needed for pain.    05/09/2017 at 0330  . oxyCODONE-acetaminophen (PERCOCET) 5-325 MG tablet Take 2 tablets by mouth every 4 (four) hours as needed. 6 tablet 0 Unknown at Unknown time    Allergies: Patient has no known allergies. Past Medical History:  Diagnosis Date  . Abortion    05/2012  . Headache(784.0)   . Sickle cell crisis [facility])    Past Surgical History:  Procedure Laterality Date  . CHOLECYSTECTOMY N/A 11/30/2014   Procedure: LAPAROSCOPIC CHOLECYSTECTOMY SINGLE SITE WITH INTRAOPERATIVE CHOLANGIOGRAM;  Surgeon: Michael Boston, MD;  Location: [facility];  Service:  General;  Laterality: N/A;  . SPLENECTOMY     Family History  Problem Relation Age of Onset  . Hypertension Mother   . Sickle cell anemia Sister   . Kidney disease Sister        Lupus  . Arthritis Sister   . Sickle cell anemia Sister   . Sickle cell trait Sister   . Heart disease Maternal Aunt        CABG  . Heart disease Maternal Uncle        CABG  . Lupus Sister    Social History   Social History  . Marital status: Single    Spouse name: N/A  . Number of children: N/A  . Years of education: N/A   Occupational History  . Not on file.   Social History Main Topics  . Smoking status: Never Smoker  . Smokeless tobacco: Never Used  . Alcohol use No  . Drug use: No  . Sexual activity: Yes    Birth control/ protection: None   Other Topics Concern  . Not on file   Social History Narrative  . No narrative on file    Review of Systems: Review of Systems  Constitutional: Negative.   HENT: Negative.   Eyes: Negative.   Respiratory: Negative.   Cardiovascular: Negative.   Gastrointestinal: Negative.   Genitourinary: Negative.   Musculoskeletal: Positive for back pain, joint pain and myalgias.  Neurological: Negative.   Endo/Heme/Allergies: Negative.   Psychiatric/Behavioral:       Tearful and aggitated    Physical Exam:  Blood pressure (!) 117/55, pulse 67, temperature 98.4 F (36.9 C), temperature source Oral, resp. rate 16, height 5\' 3"  (1.6 m), weight 180 lb (81.6 kg), last menstrual period 05/05/2017, SpO2 98 %. BP (!) 117/55 (BP Location: Left Arm, Patient Position: Sitting, Cuff Size: Normal)   Pulse 67   Temp 98.4 F (36.9 C) (Oral)   Resp 16   Ht 5\' 3"  (1.6 m)   Wt 180 lb (81.6 kg)   LMP 05/05/2017   SpO2 98%   BMI 31.89 kg/m   General Appearance:    Alert, cooperative, moderate distress, appears stated age  Head:    Normocephalic, without obvious abnormality, atraumatic  Eyes:    PERRL, conjunctiva/corneas clear, EOM's intact, fundi    benign,  both eyes  Ears:    Normal TM's and external ear canals, both ears  Nose:   Nares normal, septum midline, mucosa normal, no drainage    or sinus tenderness  Throat:   Lips, mucosa, and tongue normal; teeth and gums normal  Neck:   Supple, symmetrical, trachea midline, no adenopathy;    thyroid:  no enlargement/tenderness/nodules; no carotid   bruit or JVD  Back:     Symmetric, no curvature, ROM normal, no CVA tenderness  Lungs:     Clear to auscultation bilaterally, respirations unlabored  Chest Wall:    No tenderness or deformity   Heart:    Regular rate and rhythm, S1 and S2 normal, no murmur, rub   or gallop  Abdomen:     Soft, non-tender, bowel sounds active all four quadrants,    no masses, no organomegaly  Extremities:   Extremities normal, atraumatic, no cyanosis or edema  Pulses:   2+ and symmetric all extremities  Skin:   Skin color, texture, turgor normal, no rashes or lesions  Lymph nodes:   Cervical, supraclavicular, and axillary nodes normal  Neurologic:   CNII-XII intact, normal strength, sensation and reflexes    throughout    Lab results: No results found for this or any previous visit (from the past 24 hour(s)).  Imaging results:  No results found.   Assessment & Plan:  Patient will be admitted to the day infusion center for extended observation  Start IV D5.45 for cellular rehydration at 125/hr  Start Toradol 30 mg IV times one for inflammation  Start Dilaudid PCA High Concentration per weight based protocol.   Patient will be re-evaluated for pain intensity in the context of function and relationship to baseline as care progresses.  If no significant pain relief, will transfer patient to inpatient services for a higher level of care.   Reviewed labs and imaging from 05/07/2017, consistent with baseline   Hollis,Lachina M 05/09/2017, 9:37 AM

## 2017-05-09 NOTE — Discharge Summary (Signed)
[facility] Discharge Summary   Patient ID: Carmen [name] MRN: [id number] DOB/AGE: [birth year] 26 y.o.  Admit date: 05/09/2017 Discharge date: 05/09/2017  Primary Care Physician:  [name], MD  Admission Diagnoses:  Active Problems:   Sickle cell pain crisis [facility])   Discharge Medications:  Allergies as of 05/09/2017   No Known Allergies     Medication List    TAKE these medications   morphine 60 MG 12 hr tablet Commonly known as:  MS CONTIN Take 60 mg by mouth 2 (two) times daily as needed for pain.   oxyCODONE-acetaminophen 5-325 MG tablet Commonly known as:  PERCOCET Take 2 tablets by mouth every 4 (four) hours as needed.        Consults:  None  Significant Diagnostic Studies:  No results found.   [facility] Course: Carmen [name], a 26 year old female with a history of sickle cell anemia presents complaining of generalized pain that is consistent with her typical sickle cell crisis. She was triaged in the emergency department this am. I spoke with Dr. Davonna Belling and agree that she is appropriate for transitioning to the day infusion center for pain management.  She says that she has been having pain for greater than 1 week. She says that pain intensity is 10/10 described as constant and throbbing. She says that she is out of her home medications. She was given a prescription in the emergency department on last night, but pharmacy was closed on discharge.    Pain Management:  Patient has poor IV access, IV team notified.  D5.45 @ 125 for cellular rehydration Toradol 30 mg times 1 for inflammation Dilauidid IV per clinician assisted doses for a total of 6 mg Pain intensity decreased to 8-9/10.  Patient is alert, oriented and ambulating   Discharge Instructions:  Patient has an appointment scheduled with Dr. Alyson Ingles on today at 3 pm for medication management.  I also recommend that she call to schedule an appointment  with a hematology  Discussed the importance of drinking 64 ounces of water daily. The Importance of Water. To help prevent pain crises, it is important to drink plenty of water throughout the day. This is because dehydration of red blood cells may lead to the sickling process.   The patient was given clear instructions to go to ER or return to medical center if symptoms do not improve, worsen or new problems develop. The patient verbalized understanding.   Physical Exam at Discharge:  BP (!) 117/55 (BP Location: Left Arm, Patient Position: Sitting, Cuff Size: Normal)   Pulse 67   Temp 98.4 F (36.9 C) (Oral)   Resp 16   Ht 5\' 3"  (1.6 m)   Wt 180 lb (81.6 kg)   LMP 05/05/2017   SpO2 98%   BMI 31.89 kg/m    General Appearance:    Alert, cooperative, no distress, appears stated age  Head:    Normocephalic, without obvious abnormality, atraumatic  Back:     Symmetric, no curvature, ROM normal, no CVA tenderness  Lungs:     Clear to auscultation bilaterally, respirations unlabored  Chest Wall:    No tenderness or deformity   Heart:    Regular rate and rhythm, S1 and S2 normal, no murmur, rub   or gallop  Abdomen:     Soft, non-tender, bowel sounds active all four quadrants,    no masses, no organomegaly  Extremities:   Extremities normal, atraumatic, no cyanosis or  edema  Pulses:   2+ and symmetric all extremities  Skin:   Skin color, texture, turgor normal, no rashes or lesions  Lymph nodes:   Cervical, supraclavicular, and axillary nodes normal  Neurologic:   CNII-XII intact, normal strength, sensation and reflexes    throughout    Disposition at Discharge: Home Discharge Orders: Discharge Instructions    Discharge patient    Complete by:  As directed    Discharge disposition:  01-Home or Self Care   Discharge patient date:  05/09/2017      Condition at Discharge:   Stable  Time spent on Discharge:  Greater than 30 minutes.  Signed: Rhian Asebedo M 05/09/2017, 1:37  PM

## 2017-05-09 NOTE — Discharge Instructions (Signed)
Follow up with Dr. [name] today at 3 pm.   Sickle Cell Anemia, Adult Sickle cell anemia is a condition where your red blood cells are shaped like sickles. Red blood cells carry oxygen through the body. Sickle-shaped red blood cells do not live as long as normal red blood cells. They also clump together and block blood from flowing through the blood vessels. These things prevent the body from getting enough oxygen. Sickle cell anemia causes organ damage and pain. It also increases the risk of infection. Follow these instructions at home:  Drink enough fluid to keep your pee (urine) clear or pale yellow. Drink more in hot weather and during exercise.  Do not smoke. Smoking lowers oxygen levels in the blood.  Only take over-the-counter or prescription medicines as told by your doctor.  Take antibiotic medicines as told by your doctor. Make sure you finish them even if you start to feel better.  Take supplements as told by your doctor.  Consider wearing a medical alert bracelet. This tells anyone caring for you in an emergency of your condition.  When traveling, keep your medical information, doctors' names, and the medicines you take with you at all times.  If you have a fever, do not take fever medicines right away. This could cover up a problem. Tell your doctor.  Keep all follow-up visits with your doctor. Sickle cell anemia requires regular medical care. Contact a doctor if: You have a fever. Get help right away if:  You feel dizzy or faint.  You have new belly (abdominal) pain, especially on the left side near the stomach area.  You have a lasting, often uncomfortable and painful erection of the penis (priapism). If it is not treated right away, you will become unable to have sex (impotence).  You have numbness in your arms or legs or you have a hard time moving them.  You have a hard time talking.  You have a fever or lasting symptoms for more than 2-3 days.  You  have a fever and your symptoms suddenly get worse.  You have signs or symptoms of infection. These include: ? Chills. ? Being more tired than normal (lethargy). ? Irritability. ? Poor eating. ? Throwing up (vomiting).  You have pain that is not helped with medicine.  You have shortness of breath.  You have pain in your chest.  You are coughing up pus-like or bloody mucus.  You have a stiff neck.  Your feet or hands swell or have pain.  Your belly looks bloated.  Your joints hurt. This information is not intended to replace advice given to you by your health care provider. Make sure you discuss any questions you have with your health care provider. Document Released: 08/08/2013 Document Revised: 03/25/2016 Document Reviewed: 05/30/2013 Elsevier Interactive Patient Education  2017 [employer].

## 2017-05-09 NOTE — Discharge Instructions (Signed)
Go to the sickle cell center. °

## 2017-05-09 NOTE — ED Notes (Signed)
Patient DC to sickle cell clinic

## 2017-05-17 ENCOUNTER — Encounter (HOSPITAL_COMMUNITY): Payer: Self-pay | Admitting: Family Medicine

## 2017-05-17 ENCOUNTER — Emergency Department (HOSPITAL_COMMUNITY)
Admission: EM | Admit: 2017-05-17 | Discharge: 2017-05-17 | Disposition: A | Discharge status: Home / Self Care | Payer: Medicaid Other | Source: Home / Self Care | Attending: Emergency Medicine | Admitting: Emergency Medicine

## 2017-05-17 ENCOUNTER — Encounter (HOSPITAL_COMMUNITY): Payer: Self-pay | Admitting: Emergency Medicine

## 2017-05-17 ENCOUNTER — Inpatient Hospital Stay (HOSPITAL_COMMUNITY)
Admission: EM | Admit: 2017-05-17 | Discharge: 2017-05-22 | DRG: 812 | Discharge status: Against Medical Advice | Payer: Medicaid Other | Attending: Internal Medicine | Admitting: Internal Medicine

## 2017-05-17 DIAGNOSIS — IMO0002 Reserved for concepts with insufficient information to code with codable children: Secondary | ICD-10-CM

## 2017-05-17 DIAGNOSIS — R7989 Other specified abnormal findings of blood chemistry: Secondary | ICD-10-CM | POA: Diagnosis present

## 2017-05-17 DIAGNOSIS — K5900 Constipation, unspecified: Secondary | ICD-10-CM | POA: Diagnosis present

## 2017-05-17 DIAGNOSIS — D5700 Hb-SS disease with crisis, unspecified: Principal | ICD-10-CM

## 2017-05-17 DIAGNOSIS — D75839 Thrombocytosis, unspecified: Secondary | ICD-10-CM | POA: Diagnosis present

## 2017-05-17 DIAGNOSIS — N6310 Unspecified lump in the right breast, unspecified quadrant: Secondary | ICD-10-CM | POA: Diagnosis present

## 2017-05-17 DIAGNOSIS — D473 Essential (hemorrhagic) thrombocythemia: Secondary | ICD-10-CM | POA: Diagnosis present

## 2017-05-17 DIAGNOSIS — R229 Localized swelling, mass and lump, unspecified: Secondary | ICD-10-CM

## 2017-05-17 DIAGNOSIS — F603 Borderline personality disorder: Secondary | ICD-10-CM | POA: Diagnosis present

## 2017-05-17 DIAGNOSIS — G894 Chronic pain syndrome: Secondary | ICD-10-CM | POA: Diagnosis present

## 2017-05-17 DIAGNOSIS — Z832 Family history of diseases of the blood and blood-forming organs and certain disorders involving the immune mechanism: Secondary | ICD-10-CM

## 2017-05-17 DIAGNOSIS — Z9081 Acquired absence of spleen: Secondary | ICD-10-CM

## 2017-05-17 LAB — RETICULOCYTES
RBC.: 3.49 MIL/uL — ABNORMAL LOW (ref 3.87–5.11)
RETIC CT PCT: 6.8 % — AB (ref 0.4–3.1)
Retic Count, Absolute: 237.3 10*3/uL — ABNORMAL HIGH (ref 19.0–186.0)

## 2017-05-17 LAB — CBC WITH DIFFERENTIAL/PLATELET
Basophils Absolute: 0 10*3/uL (ref 0.0–0.1)
Basophils Relative: 0 %
EOS PCT: 1 %
Eosinophils Absolute: 0.1 10*3/uL (ref 0.0–0.7)
HCT: 28.6 % — ABNORMAL LOW (ref 36.0–46.0)
Hemoglobin: 10.3 g/dL — ABNORMAL LOW (ref 12.0–15.0)
LYMPHS ABS: 3.5 10*3/uL (ref 0.7–4.0)
LYMPHS PCT: 35 %
MCH: 29.5 pg (ref 26.0–34.0)
MCHC: 36 g/dL (ref 30.0–36.0)
MCV: 81.9 fL (ref 78.0–100.0)
MONOS PCT: 10 %
Monocytes Absolute: 0.9 10*3/uL (ref 0.1–1.0)
Neutro Abs: 5.4 10*3/uL (ref 1.7–7.7)
Neutrophils Relative %: 54 %
PLATELETS: 639 10*3/uL — AB (ref 150–400)
RBC: 3.49 MIL/uL — ABNORMAL LOW (ref 3.87–5.11)
RDW: 16 % — ABNORMAL HIGH (ref 11.5–15.5)
WBC: 9.9 10*3/uL (ref 4.0–10.5)

## 2017-05-17 LAB — COMPREHENSIVE METABOLIC PANEL
ALBUMIN: 4.7 g/dL (ref 3.5–5.0)
ALT: 16 U/L (ref 14–54)
AST: 21 U/L (ref 15–41)
Alkaline Phosphatase: 65 U/L (ref 38–126)
Anion gap: 9 (ref 5–15)
BUN: 10 mg/dL (ref 6–20)
CHLORIDE: 105 mmol/L (ref 101–111)
CO2: 27 mmol/L (ref 22–32)
CREATININE: 0.65 mg/dL (ref 0.44–1.00)
Calcium: 9.4 mg/dL (ref 8.9–10.3)
GFR calc Af Amer: >60 mL/min (ref >60)
GFR calc non Af Amer: >60 mL/min (ref >60)
GLUCOSE: 93 mg/dL (ref 65–99)
POTASSIUM: 3.8 mmol/L (ref 3.5–5.1)
SODIUM: 141 mmol/L (ref 135–145)
Total Bilirubin: 1.2 mg/dL (ref 0.3–1.2)
Total Protein: 8.2 g/dL — ABNORMAL HIGH (ref 6.5–8.1)

## 2017-05-17 MED ORDER — ONDANSETRON HCL 4 MG/2ML IJ SOLN
4.0000 mg | INTRAMUSCULAR | Status: DC | PRN
  Administered 2017-05-17: 4 mg via INTRAVENOUS
  Filled 2017-05-17: qty 2

## 2017-05-17 MED ORDER — HYDROMORPHONE HCL 1 MG/ML IJ SOLN
0.5000 mg | Freq: Once | INTRAMUSCULAR | Status: AC
  Administered 2017-05-17: 0.5 mg via SUBCUTANEOUS
  Filled 2017-05-17: qty 1

## 2017-05-17 MED ORDER — HYDROMORPHONE HCL 1 MG/ML IJ SOLN: 2.0000 mg | INTRAMUSCULAR | Status: AC

## 2017-05-17 MED ORDER — HYDROMORPHONE HCL 1 MG/ML IJ SOLN
2.0000 mg | INTRAMUSCULAR | Status: AC
  Administered 2017-05-17: 2 mg via INTRAVENOUS
  Filled 2017-05-17: qty 2

## 2017-05-17 MED ORDER — DIPHENHYDRAMINE HCL 50 MG/ML IJ SOLN
25.0000 mg | Freq: Once | INTRAMUSCULAR | Status: AC
  Administered 2017-05-17: 25 mg via INTRAVENOUS
  Filled 2017-05-17: qty 1

## 2017-05-17 MED ORDER — KETOROLAC TROMETHAMINE 30 MG/ML IJ SOLN
30.0000 mg | Freq: Once | INTRAMUSCULAR | Status: AC
  Administered 2017-05-17: 30 mg via INTRAVENOUS
  Filled 2017-05-17: qty 1

## 2017-05-17 MED ORDER — SODIUM CHLORIDE 0.45 % IV SOLN
INTRAVENOUS | Status: DC
  Administered 2017-05-17: 15:00:00 via INTRAVENOUS

## 2017-05-17 NOTE — ED Notes (Signed)
Patient reports hard stick on wants to wait on blood draw

## 2017-05-17 NOTE — ED Notes (Signed)
Bed: WA01 Expected date:  Expected time:  Means of arrival:  Comments: 

## 2017-05-17 NOTE — ED Triage Notes (Signed)
patient c/o ss pain in bilat arms and legs that started this morning.

## 2017-05-17 NOTE — ED Triage Notes (Signed)
Patient is complaining of sickle cell pain in her upper/lower extremities and back pain. Patient reports she was at [facility] earlier for upper and lower extremity pain. But, her pain has moved to her back. Patient is ambulatory to triage.

## 2017-05-17 NOTE — ED Provider Notes (Signed)
[facility] DEPT Provider Note   CSN: [id number] Arrival date & time: 05/17/17  1212     History   Chief Complaint Chief Complaint  Patient presents with  . Sickle Cell Pain Crisis  . Arm Pain    bilat  . Leg Pain    bilat    HPI Carmen [name] is a 27 y.o. female.  Carmen [name] is a 26 y.o. 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 Patient reports she woke up this morning around 5 AM and sickle cell pain crisis. She reports pain to her bilateral arms and legs. She reports taking Percocet without relief today. She reports this feels like her typical sickle cell pain. She is followed by [name]. She denies fevers, chest pain, shortness of breath, urinary symptoms, abdominal pain, nausea, vomiting, diarrhea or rashes.   The history is provided by the patient and medical records. No language interpreter was used.  Sickle Cell Pain Crisis  Associated symptoms: no chest pain, no congestion, no cough, no fever, no headaches, no nausea, no shortness of breath, no sore throat and no vomiting   Arm Pain  Pertinent negatives include no chest pain, no abdominal pain, no headaches and no shortness of breath.  Leg Pain      Past Medical History:  Diagnosis Date  . Abortion    05/2012  . Headache(784.0)   . Sickle cell crisis [facility])     Patient Active Problem List   Diagnosis Date Noted  . 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 Sickle cell pain crisis ([place]) 03/19/2017  . Sickle cell anemia ([place]) 08/15/2016  . Breast lump 08/15/2016  . Anemia of chronic disease   . 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 S/P laparoscopic cholecystectomy 11/30/2014  . Hypokalemia 11/28/2014  . Leukocytosis 11/22/2014  . Thrombocytosis ([place]) 11/22/2014    Past Surgical History:  Procedure Laterality Date  . CHOLECYSTECTOMY N/A 11/30/2014   Procedure: LAPAROSCOPIC CHOLECYSTECTOMY SINGLE SITE WITH INTRAOPERATIVE CHOLANGIOGRAM;  Surgeon: Michael Boston, MD;  Location: [facility];  Service: General;  Laterality: N/A;  . SPLENECTOMY      OB History    Gravida Para Term Preterm AB Living   1       1     SAB TAB Ectopic Multiple Live Births                   Home Medications    Prior to Admission medications   Medication Sig Start Date End Date Taking? Authorizing Provider  morphine (MS CONTIN) 60 MG 12 hr tablet Take 60 mg by mouth 2 (two) times daily as needed for pain.    Yes [provider]  oxyCODONE-acetaminophen (PERCOCET) 10-325 MG tablet Take 1 tablet by mouth every 4 (four) hours as needed for severe pain. 05/09/17  Yes [provider]  oxyCODONE-acetaminophen (PERCOCET) 5-325 MG tablet Take 2 tablets by mouth every 4 (four) hours as needed. Patient not taking: Reported on 05/17/2017 05/08/17   Julianne Rice, MD    Family History Family History  Problem Relation Age of Onset  . Hypertension Mother   . Sickle cell anemia Sister   . Kidney disease Sister        Lupus  . Arthritis Sister   . Sickle cell anemia Sister   . Sickle cell trait Sister   . Heart disease  Maternal Aunt        CABG  . Heart disease Maternal Uncle        CABG  . Lupus Sister     Social History Social History  Substance Use Topics  . Smoking status: Never Smoker  . Smokeless tobacco: Never Used  . Alcohol use No     Allergies   Patient has no known allergies.   Review of Systems Review of Systems  Constitutional: Negative for chills and fever.  HENT: Negative for congestion and sore throat.   Eyes: Negative for visual disturbance.  Respiratory: Negative for cough and shortness of breath.   Cardiovascular: Negative for chest pain.  Gastrointestinal: Negative for abdominal pain, diarrhea, nausea and vomiting.  Genitourinary: Negative for dysuria.  Musculoskeletal: Positive for arthralgias. Negative for back  pain, joint swelling and neck pain.  Skin: Negative for rash.  Neurological: Negative for light-headedness and headaches.     Physical Exam Updated Vital Signs BP 112/71   Pulse (!) 59   Temp 98.3 F (36.8 C) (Oral)   Resp 12   Ht 5\' 3"  (1.6 m)   Wt 81.6 kg (180 lb)   LMP 05/05/2017   SpO2 94%   BMI 31.89 kg/m   Physical Exam  Constitutional: She appears well-developed and well-nourished. No distress.  Nontoxic appearing.  HENT:  Head: Normocephalic and atraumatic.  Mouth/Throat: Oropharynx is clear and moist.  Eyes: Pupils are equal, round, and reactive to light. Conjunctivae are normal. Right eye exhibits no discharge. Left eye exhibits no discharge.  Neck: Normal range of motion. Neck supple.  Cardiovascular: Normal rate, regular rhythm, normal heart sounds and intact distal pulses.  Exam reveals no gallop and no friction rub.   No murmur heard. Pulmonary/Chest: Effort normal and breath sounds normal. No respiratory distress. She has no wheezes. She has no rales.  Lungs are clear to ascultation bilaterally. Symmetric chest expansion bilaterally. No increased work of breathing. No rales or rhonchi.    Abdominal: Soft. There is no tenderness. There is no guarding.  Abdomen is soft and nontender palpation.  Musculoskeletal: Normal range of motion. She exhibits no edema or tenderness.  Patient's bilateral shoulder, elbow, wrist, hip, knee and ankle joints are supple and without erythema or edema. Patient is spontaneously moving all extremities in a coordinated fashion exhibiting good strength.   Lymphadenopathy:    She has no cervical adenopathy.  Neurological: She is alert. No sensory deficit. Coordination normal.  Skin: Skin is warm and dry. Capillary refill takes less than 2 seconds. No rash noted. She is not diaphoretic. No erythema. No pallor.  Psychiatric: She has a normal mood and affect. Her behavior is normal.  Nursing note and vitals reviewed.    ED Treatments /  Results  Labs (all labs ordered are listed, but only abnormal results are displayed) Labs Reviewed  COMPREHENSIVE METABOLIC PANEL - Abnormal; Notable for the following:       Result Value   Total Protein 8.2 (*)    All other components within normal limits  CBC WITH DIFFERENTIAL/PLATELET - Abnormal; Notable for the following:    RBC 3.49 (*)    Hemoglobin 10.3 (*)    HCT 28.6 (*)    RDW 16.0 (*)    Platelets 639 (*)    All other components within normal limits  RETICULOCYTES - Abnormal; Notable for the following:    Retic Ct Pct 6.8 (*)    RBC. 3.49 (*)    Retic Count, Absolute 237.3 (*)  All other components within normal limits    EKG  EKG Interpretation None       Radiology No results found.  Procedures Procedures (including critical care time)  Medications Ordered in ED Medications  0.45 % sodium chloride infusion ( Intravenous New Bag/Given 05/17/17 1437)  ondansetron (ZOFRAN) injection 4 mg (4 mg Intravenous Given 05/17/17 1439)  HYDROmorphone (DILAUDID) injection 0.5 mg (0.5 mg Subcutaneous Given 05/17/17 1239)  HYDROmorphone (DILAUDID) injection 2 mg (2 mg Intravenous Given 05/17/17 1450)    Or  HYDROmorphone (DILAUDID) injection 2 mg ( Subcutaneous See Alternative 05/17/17 1450)  HYDROmorphone (DILAUDID) injection 2 mg (2 mg Intravenous Given 05/17/17 1536)    Or  HYDROmorphone (DILAUDID) injection 2 mg ( Subcutaneous See Alternative 05/17/17 1536)  HYDROmorphone (DILAUDID) injection 2 mg (2 mg Intravenous Given 05/17/17 1606)    Or  HYDROmorphone (DILAUDID) injection 2 mg ( Subcutaneous See Alternative 05/17/17 1606)  diphenhydrAMINE (BENADRYL) injection 25 mg (25 mg Intravenous Given 05/17/17 1439)  ketorolac (TORADOL) 30 MG/ML injection 30 mg (30 mg Intravenous Given 05/17/17 1447)     Initial Impression / Assessment and Plan / ED Course  I have reviewed the triage vital signs and the nursing notes.  Pertinent labs & imaging results that were available  during my care of the patient were reviewed by me and considered in my medical decision making (see chart for details).    This  is a 26 y.o. 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 Patient reports she woke up this morning around 5 AM and sickle cell pain crisis. She reports pain to her bilateral arms and legs. She reports taking Percocet without relief today. She reports this feels like her typical sickle cell pain. She is followed by [name]. She denies fevers, chest pain, shortness of breath. On exam the patient is afebrile and nontoxic appearing. Lungs are clear to auscultation bilaterally. Joints are supple without erythema or edema. Blood work shows a hemoglobin that is increased from her baseline. Reticulocyte count percentage is 6.8. CMP is unremarkable. She has preserved kidney function. At evaluation following her 3 doses of pain medication patient reports she is feeling much better and feels good enough to go home. She declines admission. I encouraged her to follow-up with her sickle cell doctor. Pain medications at home as prescribed. I discussed return precautions. I advised the patient to follow-up with their primary care provider this week. I advised the patient to return to the emergency department with new or worsening symptoms or new concerns. The patient verbalized understanding and agreement with plan.      Final Clinical Impressions(s) / ED Diagnoses   Final diagnoses:  Sickle cell pain crisis [facility])    New Prescriptions New Prescriptions   No medications on file     Waynetta Pean, Hershal Coria 05/17/17 [place], Ankit, MD 05/18/17 [phone]

## 2017-05-18 ENCOUNTER — Encounter (HOSPITAL_COMMUNITY): Payer: Self-pay | Admitting: Internal Medicine

## 2017-05-18 DIAGNOSIS — K5900 Constipation, unspecified: Secondary | ICD-10-CM | POA: Diagnosis present

## 2017-05-18 DIAGNOSIS — D5700 Hb-SS disease with crisis, unspecified: Secondary | ICD-10-CM | POA: Diagnosis present

## 2017-05-18 DIAGNOSIS — F609 Personality disorder, unspecified: Secondary | ICD-10-CM | POA: Diagnosis not present

## 2017-05-18 DIAGNOSIS — F603 Borderline personality disorder: Secondary | ICD-10-CM | POA: Diagnosis present

## 2017-05-18 DIAGNOSIS — R229 Localized swelling, mass and lump, unspecified: Secondary | ICD-10-CM | POA: Diagnosis not present

## 2017-05-18 DIAGNOSIS — Z832 Family history of diseases of the blood and blood-forming organs and certain disorders involving the immune mechanism: Secondary | ICD-10-CM | POA: Diagnosis not present

## 2017-05-18 DIAGNOSIS — G894 Chronic pain syndrome: Secondary | ICD-10-CM | POA: Diagnosis present

## 2017-05-18 DIAGNOSIS — N6310 Unspecified lump in the right breast, unspecified quadrant: Secondary | ICD-10-CM | POA: Diagnosis present

## 2017-05-18 DIAGNOSIS — Z9081 Acquired absence of spleen: Secondary | ICD-10-CM | POA: Diagnosis not present

## 2017-05-18 DIAGNOSIS — D638 Anemia in other chronic diseases classified elsewhere: Secondary | ICD-10-CM | POA: Diagnosis not present

## 2017-05-18 DIAGNOSIS — R7989 Other specified abnormal findings of blood chemistry: Secondary | ICD-10-CM | POA: Diagnosis present

## 2017-05-18 DIAGNOSIS — R52 Pain, unspecified: Secondary | ICD-10-CM | POA: Diagnosis present

## 2017-05-18 DIAGNOSIS — D473 Essential (hemorrhagic) thrombocythemia: Secondary | ICD-10-CM | POA: Diagnosis not present

## 2017-05-18 LAB — CBC WITH DIFFERENTIAL/PLATELET
BASOS PCT: 0 %
Basophils Absolute: 0 10*3/uL (ref 0.0–0.1)
EOS ABS: 0.1 10*3/uL (ref 0.0–0.7)
Eosinophils Relative: 1 %
HEMATOCRIT: 26.2 % — AB (ref 36.0–46.0)
HEMOGLOBIN: 9.3 g/dL — AB (ref 12.0–15.0)
LYMPHS ABS: 4.7 10*3/uL — AB (ref 0.7–4.0)
Lymphocytes Relative: 45 %
MCH: 29.2 pg (ref 26.0–34.0)
MCHC: 35.5 g/dL (ref 30.0–36.0)
MCV: 82.1 fL (ref 78.0–100.0)
MONOS PCT: 9 %
Monocytes Absolute: 0.9 10*3/uL (ref 0.1–1.0)
NEUTROS ABS: 4.7 10*3/uL (ref 1.7–7.7)
NEUTROS PCT: 45 %
Platelets: 573 10*3/uL — ABNORMAL HIGH (ref 150–400)
RBC: 3.19 MIL/uL — AB (ref 3.87–5.11)
RDW: 15.7 % — ABNORMAL HIGH (ref 11.5–15.5)
WBC: 10.4 10*3/uL (ref 4.0–10.5)

## 2017-05-18 LAB — COMPREHENSIVE METABOLIC PANEL
ALBUMIN: 4.2 g/dL (ref 3.5–5.0)
ALK PHOS: 56 U/L (ref 38–126)
ALT: 15 U/L (ref 14–54)
AST: 22 U/L (ref 15–41)
Anion gap: 7 (ref 5–15)
BILIRUBIN TOTAL: 1.5 mg/dL — AB (ref 0.3–1.2)
BUN: 12 mg/dL (ref 6–20)
CO2: 27 mmol/L (ref 22–32)
CREATININE: 0.75 mg/dL (ref 0.44–1.00)
Calcium: 9.2 mg/dL (ref 8.9–10.3)
Chloride: 105 mmol/L (ref 101–111)
GFR calc Af Amer: >60 mL/min (ref >60)
GFR calc non Af Amer: >60 mL/min (ref >60)
GLUCOSE: 98 mg/dL (ref 65–99)
POTASSIUM: 3.6 mmol/L (ref 3.5–5.1)
Sodium: 139 mmol/L (ref 135–145)
Total Protein: 7.5 g/dL (ref 6.5–8.1)

## 2017-05-18 LAB — RETICULOCYTES
RBC.: 3.19 MIL/uL — AB (ref 3.87–5.11)
RETIC CT PCT: 6.9 % — AB (ref 0.4–3.1)
Retic Count, Absolute: 220.1 10*3/uL — ABNORMAL HIGH (ref 19.0–186.0)

## 2017-05-18 LAB — PREGNANCY, URINE: Preg Test, Ur: NEGATIVE

## 2017-05-18 MED ORDER — KETOROLAC TROMETHAMINE 30 MG/ML IJ SOLN
30.0000 mg | Freq: Once | INTRAMUSCULAR | Status: AC
  Administered 2017-05-18: 30 mg via INTRAVENOUS

## 2017-05-18 MED ORDER — DEXTROSE-NACL 5-0.45 % IV SOLN
INTRAVENOUS | Status: DC
  Administered 2017-05-18 – 2017-05-19 (×6): via INTRAVENOUS
  Administered 2017-05-20: 1000 mL via INTRAVENOUS
  Administered 2017-05-21 – 2017-05-22 (×3): via INTRAVENOUS

## 2017-05-18 MED ORDER — NALOXONE HCL 0.4 MG/ML IJ SOLN: 0.4000 mg | INTRAMUSCULAR | Status: DC | PRN

## 2017-05-18 MED ORDER — METOCLOPRAMIDE HCL 5 MG/ML IJ SOLN
5.0000 mg | Freq: Once | INTRAMUSCULAR | Status: AC
  Administered 2017-05-18: 5 mg via INTRAVENOUS
  Filled 2017-05-18: qty 2

## 2017-05-18 MED ORDER — HEPARIN SODIUM (PORCINE) 5000 UNIT/ML IJ SOLN: 5000.0000 [IU] | Freq: Three times a day (TID) | INTRAMUSCULAR | Status: DC

## 2017-05-18 MED ORDER — SODIUM CHLORIDE 0.9 % IV SOLN
25.0000 mg | INTRAVENOUS | Status: DC | PRN
  Filled 2017-05-18: qty 0.5

## 2017-05-18 MED ORDER — KETOROLAC TROMETHAMINE 30 MG/ML IJ SOLN
INTRAMUSCULAR | Status: AC
  Administered 2017-05-18: 30 mg
  Filled 2017-05-18: qty 1

## 2017-05-18 MED ORDER — SODIUM CHLORIDE 0.9% FLUSH
9.0000 mL | INTRAVENOUS | Status: DC | PRN
Start: 2017-05-18 — End: 2017-05-22

## 2017-05-18 MED ORDER — HEPARIN SODIUM (PORCINE) 5000 UNIT/ML IJ SOLN
5000.0000 [IU] | Freq: Three times a day (TID) | INTRAMUSCULAR | Status: DC
  Administered 2017-05-18 – 2017-05-22 (×9): 5000 [IU] via SUBCUTANEOUS
  Filled 2017-05-18 (×12): qty 1

## 2017-05-18 MED ORDER — HYDROMORPHONE HCL-NACL 0.5-0.9 MG/ML-% IV SOSY
1.0000 mg | PREFILLED_SYRINGE | Freq: Once | INTRAVENOUS | Status: AC
  Administered 2017-05-18: 1 mg via INTRAVENOUS

## 2017-05-18 MED ORDER — KETOROLAC TROMETHAMINE 30 MG/ML IJ SOLN
30.0000 mg | Freq: Four times a day (QID) | INTRAMUSCULAR | Status: AC
  Administered 2017-05-18 (×4): 30 mg via INTRAVENOUS
  Filled 2017-05-18 (×5): qty 1

## 2017-05-18 MED ORDER — POLYETHYLENE GLYCOL 3350 17 G PO PACK
17.0000 g | PACK | Freq: Every day | ORAL | Status: DC | PRN
  Administered 2017-05-19: 17 g via ORAL
  Filled 2017-05-18: qty 1

## 2017-05-18 MED ORDER — ONDANSETRON HCL 4 MG/2ML IJ SOLN
4.0000 mg | Freq: Four times a day (QID) | INTRAMUSCULAR | Status: DC | PRN
  Administered 2017-05-18 – 2017-05-21 (×6): 4 mg via INTRAVENOUS
  Filled 2017-05-18 (×6): qty 2

## 2017-05-18 MED ORDER — PANTOPRAZOLE SODIUM 40 MG PO TBEC
40.0000 mg | DELAYED_RELEASE_TABLET | Freq: Every day | ORAL | Status: DC
  Administered 2017-05-18 – 2017-05-22 (×5): 40 mg via ORAL
  Filled 2017-05-18 (×5): qty 1

## 2017-05-18 MED ORDER — SODIUM CHLORIDE 0.9% FLUSH: 9.0000 mL | INTRAVENOUS | Status: DC | PRN

## 2017-05-18 MED ORDER — DIPHENHYDRAMINE HCL 25 MG PO CAPS: 25.0000 mg | ORAL_CAPSULE | ORAL | Status: DC | PRN

## 2017-05-18 MED ORDER — HYDROMORPHONE 1 MG/ML IV SOLN
INTRAVENOUS | Status: DC
  Administered 2017-05-18: 3.8 mg via INTRAVENOUS
  Administered 2017-05-18: 03:00:00 via INTRAVENOUS
  Filled 2017-05-18: qty 25

## 2017-05-18 MED ORDER — SENNOSIDES-DOCUSATE SODIUM 8.6-50 MG PO TABS
1.0000 | ORAL_TABLET | Freq: Two times a day (BID) | ORAL | Status: DC
  Administered 2017-05-18 – 2017-05-22 (×10): 1 via ORAL
  Filled 2017-05-18 (×10): qty 1

## 2017-05-18 MED ORDER — ONDANSETRON HCL 4 MG/2ML IJ SOLN: 4.0000 mg | Freq: Four times a day (QID) | INTRAMUSCULAR | Status: DC | PRN

## 2017-05-18 MED ORDER — SODIUM CHLORIDE 0.45 % IV BOLUS
1000.0000 mL | Freq: Once | INTRAVENOUS | Status: AC
  Administered 2017-05-18: 1000 mL via INTRAVENOUS

## 2017-05-18 MED ORDER — DIPHENHYDRAMINE HCL 12.5 MG/5ML PO ELIX: 12.5000 mg | ORAL_SOLUTION | Freq: Four times a day (QID) | ORAL | Status: DC | PRN

## 2017-05-18 MED ORDER — DIPHENHYDRAMINE HCL 50 MG/ML IJ SOLN: 12.5000 mg | Freq: Four times a day (QID) | INTRAMUSCULAR | Status: DC | PRN

## 2017-05-18 MED ORDER — HYDROMORPHONE HCL 1 MG/ML IJ SOLN
INTRAMUSCULAR | Status: AC
  Filled 2017-05-18: qty 2

## 2017-05-18 MED ORDER — HYDROMORPHONE 1 MG/ML IV SOLN
INTRAVENOUS | Status: DC
  Administered 2017-05-18: 1 mg via INTRAVENOUS
  Administered 2017-05-18: 5.8 mg via INTRAVENOUS
  Administered 2017-05-18: 6 mg via INTRAVENOUS
  Administered 2017-05-18: 3.7 mg via INTRAVENOUS
  Administered 2017-05-18: 22:00:00 via INTRAVENOUS
  Administered 2017-05-19: 3.5 mg via INTRAVENOUS
  Administered 2017-05-19: 6.5 mg via INTRAVENOUS
  Administered 2017-05-19: 1.5 mg via INTRAVENOUS
  Administered 2017-05-19: 3 mg via INTRAVENOUS
  Administered 2017-05-19: 5.5 mg via INTRAVENOUS
  Administered 2017-05-19: 20:00:00 via INTRAVENOUS
  Administered 2017-05-20: 13 mg via INTRAVENOUS
  Administered 2017-05-20: 5.5 mg via INTRAVENOUS
  Administered 2017-05-20: 10 mg via INTRAVENOUS
  Administered 2017-05-20: 5 mg via INTRAVENOUS
  Administered 2017-05-20: 8.5 mg via INTRAVENOUS
  Administered 2017-05-20: 11:00:00 via INTRAVENOUS
  Administered 2017-05-21: 7.4 mg via INTRAVENOUS
  Administered 2017-05-21: 2.5 mg via INTRAVENOUS
  Administered 2017-05-21: 8 mg via INTRAVENOUS
  Administered 2017-05-21: 5 mg via INTRAVENOUS
  Administered 2017-05-21: 2.5 mg via INTRAVENOUS
  Administered 2017-05-21: 3 mg via INTRAVENOUS
  Administered 2017-05-22: 7 mg via INTRAVENOUS
  Administered 2017-05-22: 11.5 mg via INTRAVENOUS
  Administered 2017-05-22: 25 mg via INTRAVENOUS
  Filled 2017-05-18 (×6): qty 25

## 2017-05-18 MED ORDER — HYDROMORPHONE HCL 1 MG/ML IJ SOLN
2.0000 mg | Freq: Once | INTRAMUSCULAR | Status: AC
  Administered 2017-05-18: 2 mg via INTRAVENOUS

## 2017-05-18 MED ORDER — HYDROMORPHONE HCL 1 MG/ML IJ SOLN: 2.0000 mg | INTRAMUSCULAR | Status: DC | PRN

## 2017-05-18 NOTE — Progress Notes (Signed)
Earlier this evening patient c/o N/V unrelieved by zofran. Paged oncall MD. One time dose of reglan 5mg  IV ordered and given to patient. Patient now has no c/o of nausea.

## 2017-05-18 NOTE — ED Provider Notes (Signed)
[facility] DEPT Provider Note   CSN: [id number] Arrival date & time: 05/17/17  2147  By signing my name below, I, Mayer Masker, attest that this documentation has been prepared under the direction and in the presence of Reubin Milan, MD. Electronically Signed: Mayer Masker, Scribe. 05/18/17. 12:23 AM. History   Chief Complaint Chief Complaint  Patient presents with  . Sickle Cell Pain Crisis  The history is provided by the patient. No language interpreter was used.   Marland KitchenHPI Comments: Carmen [name] is a 26 y.o. female who presents to the Emergency Department complaining of constant, gradually worsening sickle cell pain in her arms and legs that has spread to her back. She has been taking 10mg  percocet and 60mg  morphine with no relief. She states the pain is worsening, despite being seen and evaluated here earlier today. Dr. Alyson Ingles is her PCP.   Past Medical History:  Diagnosis Date  . Abortion    05/2012  . Headache(784.0)   . Sickle cell crisis [facility])     Patient Active Problem List   Diagnosis Date Noted  . 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 Sickle cell pain crisis ([place]) 03/19/2017  . Sickle cell anemia ([place]) 08/15/2016  . Breast lump 08/15/2016  . Anemia of chronic disease   . 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 S/P laparoscopic cholecystectomy 11/30/2014  . Hypokalemia 11/28/2014  . Leukocytosis 11/22/2014  . Thrombocytosis ([place]) 11/22/2014    Past Surgical History:  Procedure Laterality Date  . CHOLECYSTECTOMY N/A 11/30/2014   Procedure: LAPAROSCOPIC CHOLECYSTECTOMY SINGLE SITE WITH INTRAOPERATIVE CHOLANGIOGRAM;  Surgeon: Michael Boston, MD;  Location: [facility] ORS;  Service: General;  Laterality: N/A;  . SPLENECTOMY      OB History    Gravida Para Term Preterm AB Living   1       1     SAB TAB Ectopic Multiple Live Births                   Home Medications    Prior to Admission medications   Medication Sig Start  Date End Date Taking? Authorizing Provider  morphine (MS CONTIN) 60 MG 12 hr tablet Take 60 mg by mouth 2 (two) times daily as needed for pain.    Yes [provider]  oxyCODONE-acetaminophen (PERCOCET) 10-325 MG tablet Take 1 tablet by mouth every 4 (four) hours as needed for severe pain. 05/09/17  Yes [provider]  oxyCODONE-acetaminophen (PERCOCET) 5-325 MG tablet Take 2 tablets by mouth every 4 (four) hours as needed. Patient not taking: Reported on 05/17/2017 05/08/17   Julianne Rice, MD    Family History Family History  Problem Relation Age of Onset  . Hypertension Mother   . Sickle cell anemia Sister   . Kidney disease Sister        Lupus  . Arthritis Sister   . Sickle cell anemia Sister   . Sickle cell trait Sister   . Heart disease Maternal Aunt        CABG  . Heart disease Maternal Uncle        CABG  . Lupus Sister     Social History Social History  Substance Use Topics  . Smoking status: Never Smoker  . Smokeless tobacco: Never Used  . Alcohol use No     Allergies   Patient has no known allergies.   Review of Systems Review of Systems  Constitutional: Negative for appetite change, chills,  diaphoresis, fatigue and fever.  HENT: Negative for mouth sores, sore throat and trouble swallowing.   Eyes: Negative for visual disturbance.  Respiratory: Negative for cough, chest tightness, shortness of breath and wheezing.   Cardiovascular: Negative for chest pain.  Gastrointestinal: Negative for abdominal distention, abdominal pain, diarrhea, nausea and vomiting.  Endocrine: Negative for polydipsia, polyphagia and polyuria.  Genitourinary: Negative for dysuria, frequency and hematuria.  Musculoskeletal: Positive for back pain and myalgias. Negative for gait problem.  Skin: Negative for color change, pallor and rash.  Neurological: Negative for dizziness, syncope, light-headedness and headaches.  Hematological: Does not bruise/bleed easily.    Psychiatric/Behavioral: Negative for behavioral problems and confusion.  All other systems reviewed and are negative.    Physical Exam Updated Vital Signs BP (!) 129/98 (BP Location: Right Arm)   Pulse 70   Temp 98.1 F (36.7 C) (Oral)   Resp (!) 21   Ht 5\' 3"  (1.6 m)   Wt 81.6 kg (180 lb)   LMP 05/05/2017   SpO2 97%   BMI 31.89 kg/m   Physical Exam  Constitutional: She is oriented to person, place, and time. She appears well-developed and well-nourished. No distress.  HENT:  Head: Normocephalic.  Eyes: Pupils are equal, round, and reactive to light. Conjunctivae are normal. No scleral icterus.  Neck: Normal range of motion. Neck supple. No thyromegaly present.  Cardiovascular: Normal rate and regular rhythm.  Exam reveals no gallop and no friction rub.   No murmur heard. Pulmonary/Chest: Effort normal and breath sounds normal. No respiratory distress. She has no wheezes. She has no rales.  Abdominal: Soft. Bowel sounds are normal. She exhibits no distension. There is no tenderness. There is no rebound.  Musculoskeletal: Normal range of motion.  Neurological: She is alert and oriented to person, place, and time.  Skin: Skin is warm and dry. No rash noted.  Psychiatric: She has a normal mood and affect. Her behavior is normal.     ED Treatments / Results  DIAGNOSTIC STUDIES: Oxygen Saturation is 96% on RA, normal by my interpretation.    COORDINATION OF CARE: 12:15 AM Discussed treatment plan with pt at bedside and pt agreed to plan.  Labs (all labs ordered are listed, but only abnormal results are displayed) Labs Reviewed  COMPREHENSIVE METABOLIC PANEL - Abnormal; Notable for the following:       Result Value   Total Bilirubin 1.5 (*)    All other components within normal limits  CBC WITH DIFFERENTIAL/PLATELET - Abnormal; Notable for the following:    RBC 3.19 (*)    Hemoglobin 9.3 (*)    HCT 26.2 (*)    RDW 15.7 (*)    Platelets 573 (*)    Lymphs Abs 4.7 (*)     All other components within normal limits  RETICULOCYTES - Abnormal; Notable for the following:    Retic Ct Pct 6.9 (*)    RBC. 3.19 (*)    Retic Count, Absolute 220.1 (*)    All other components within normal limits    EKG  EKG Interpretation None       Radiology No results found.  Procedures Procedures (including critical care time)  Medications Ordered in ED Medications  sodium chloride 0.45 % bolus 1,000 mL (1,000 mLs Intravenous New Bag/Given 05/18/17 0057)  HYDROmorphone (DILAUDID) 1 MG/ML injection (not administered)  ketorolac (TORADOL) 30 MG/ML injection (not administered)  HYDROmorphone (DILAUDID) injection 2 mg (not administered)  HYDROmorphone (DILAUDID) injection 0.5 mg (0.5 mg Subcutaneous Given 05/17/17  2233)  HYDROmorphone (DILAUDID) injection 2 mg (2 mg Intravenous Given 05/18/17 0102)  ketorolac (TORADOL) 30 MG/ML injection 30 mg (30 mg Intravenous Given 05/18/17 0103)     Initial Impression / Assessment and Plan / ED Course  I have reviewed the triage vital signs and the nursing notes.  Pertinent labs & imaging results that were available during my care of the patient were reviewed by me and considered in my medical decision making (see chart for details).     Patient returns for refractory pain. Care discussed with Dr. Olevia Bowens. Will be admitted.  Final Clinical Impressions(s) / ED Diagnoses   Final diagnoses:  Sickle cell pain crisis [facility])    New Prescriptions New Prescriptions   No medications on file      Tanna Furry, MD 05/18/17 [phone]

## 2017-05-18 NOTE — Progress Notes (Signed)
Chaplain following due to spiritual care consult.    Carmen [name] arrived last night and spoke with nurse about advance directives.  Chaplain provided paperwork and education around [place] and Living Will.  Carmen [name] does not yet know who she would name as HCPOA, so would like to take time to consider.    Chaplain provided emotional support around rehospitalization.   Will follow up for continued support and completion of Adv. Dir as pt is able.      [facility] / [place], MDiv

## 2017-05-18 NOTE — Progress Notes (Signed)
SICKLE CELL SERVICE PROGRESS NOTE  Carmen [name] BJY:[id number] DOB: 1991-07-22 DOA: 05/17/2017 PCP: [name], MD  Assessment/Plan: Principal Problem:   Sickle cell pain crisis [facility]) Active Problems:   Thrombocytosis ([place])  1. 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 Will give 1 dose Dilaudid now. Continue Toradol and IVF. 2. Anemia of Chronic Disease: Hb At baseline 3. Chronic Pain Syndrome: Resume MS Contin. 4. DVT Prophylaxis: Lovenox.     Code Status: Full Code Family Communication: N/A Disposition Plan: Not yet ready for discharge  [place].  Pager [phone]. If 7PM-7AM, please contact night-coverage.  05/18/2017, 5:35 PM  LOS: 0 days   Interim History: Pt admitted last evening after sudden onset of pain in legs the night before. She was seen in the ED and released. However, after about 2 hours at home, the pain returned and she returned to the ED. She reports that the pain is now localized to her legs, arms and low back at an intensity of 10/10. Since has had minimal use of the PCA as she reports that she has been sleeping a lot today. She has no other acute complaints but states that she has been experiencing a lot of stress due to her mothers failing health.   Consultants:  None  Procedures:  None  Antibiotics:  None    Objective: Vitals:   05/18/17 0800 05/18/17 1200 05/18/17 1427 05/18/17 1549  BP:   113/81   Pulse:   (!) 54   Resp: 17 17 13 19   Temp:   98 F (36.7 C)   TempSrc:   Oral   SpO2: 98% 100% 99% 100%  Weight:      Height:       Weight change:   Intake/Output Summary (Last 24 hours) at 05/18/17 1735 Last data filed at 05/18/17 1400  Gross per 24 hour  Intake          1644.17 ml  Output              800 ml  Net           844.17 ml      Physical Exam General: Alert, awake, oriented x3, in no apparent distress.  HEENT: [place]/AT PEERL, EOMI, anicteric Neck: Trachea midline,  no masses, no  thyromegal,y no JVD, no carotid bruit OROPHARYNX:  Moist, No exudate/ erythema/lesions.  Heart: Regular rate and rhythm, without murmurs, rubs, gallops, PMI non-displaced, no heaves or thrills on palpation.  Lungs: Clear to auscultation, no wheezing or rhonchi noted. No increased vocal fremitus resonant to percussion  Abdomen: Soft, nontender, nondistended, positive bowel sounds, no masses no hepatosplenomegaly noted.  Neuro: No focal neurological deficits noted cranial nerves II through XII grossly intact. Strength at baseline in bilateral upper and lower extremities. Musculoskeletal: No warmth swelling or erythema around joints, no spinal tenderness noted. Psychiatric: Patient alert and oriented x3, good insight and cognition, good recent to remote recall.    Data Reviewed: Basic Metabolic Panel:  Recent Labs Lab 05/17/17 1354 05/18/17 0045  NA 141 139  K 3.8 3.6  CL 105 105  CO2 27 27  GLUCOSE 93 98  BUN 10 12  CREATININE 0.65 0.75  CALCIUM 9.4 9.2   Liver Function Tests:  Recent Labs Lab 05/17/17 1354 05/18/17 0045  AST 21 22  ALT 16 15  ALKPHOS 65 56  BILITOT 1.2 1.5*  PROT 8.2* 7.5  ALBUMIN 4.7 4.2   No results for input(s): LIPASE, AMYLASE  in the last 168 hours. No results for input(s): AMMONIA in the last 168 hours. CBC:  Recent Labs Lab 05/17/17 1354 05/18/17 0045  WBC 9.9 10.4  NEUTROABS 5.4 4.7  HGB 10.3* 9.3*  HCT 28.6* 26.2*  MCV 81.9 82.1  PLT 639* 573*   Cardiac Enzymes: No results for input(s): CKTOTAL, CKMB, CKMBINDEX, TROPONINI in the last 168 hours. BNP (last 3 results) No results for input(s): BNP in the last 8760 hours.  ProBNP (last 3 results) No results for input(s): PROBNP in the last 8760 hours.  CBG: No results for input(s): GLUCAP in the last 168 hours.  No results found for this or any previous visit (from the past 240 hour(s)).   Studies: No results found.  Scheduled Meds: . heparin  5,000 Units Subcutaneous Q8H  .  HYDROmorphone   Intravenous Q4H  . ketorolac  30 mg Intravenous Q6H  . pantoprazole  40 mg Oral Daily  . senna-docusate  1 tablet Oral BID   Continuous Infusions: . dextrose 5 % and 0.45% NaCl 125 mL/hr at 05/18/17 1036  . diphenhydrAMINE (BENADRYL) IVPB(SICKLE CELL ONLY)      Principal Problem:   Sickle cell pain crisis ([place]) Active Problems:   Thrombocytosis ([facility])    In excess of 25 minutes spent during this visit. Greater than 50% involved face to face contact with the patient for assessment, counseling and coordination of care.

## 2017-05-18 NOTE — H&P (Signed)
History and Physical    Carmen [name]:[id number] DOB: 06/24/1991 DOA: 05/17/2017  PCP: [name], MD   Patient coming from: Home.  I have personally briefly reviewed patient's old medical records in [place]  Chief Complaint: Sickle cell pain crisis.  HPI: Carmen [name] is a 27 y.o. female with medical history significant of sickle cell anemia who is returning for the second time today to the emergency department after waking up around 03:30 yesterday morning with complaints of progressively worse lower and upper extremities pain. She was seen earlier in the emergency department and was given the choice to be admitted versus going home. She went home and took some of her Percocet tablets. She is also on MS Contin 60 mg twice a day without significant results. She had to return to the emergency department due to re-exacerbation of pain. She currently rates the pain about a 10 over 10. She feels mildly dyspneic. She denies fever, chills, sore throat, productive cough,  chest pain, palpitations, diaphoresis, pitting edema lower extremities, abdominal pain, nausea, emesis, diarrhea, constipation, melena, hematochezia, dysuria, frequency or hematuria. She denies skin rashes or pruritus.  ED Course: Initial vital signs in the emergency department show a temperature 98.26F, pulse of 85, blood pressure 107/71 mmHg, respirations 18 and O2 sat 96% on room air. Hemoglobin level this evening was 9.3 g/dL, reticulocyte count 6.9% WBC 10.4 and platelets 573. Her CMP is normal with the exception of a mildly elevated bilirubin at 1.5 mg/dL. She received half normal saline 1 L bolus, hydromorphone 2.5 total milligrams and Toradol 30 mg IVP in the emergency department.  Review of Systems: As per HPI otherwise 10 point review of systems negative.    Past Medical History:  Diagnosis Date  . Abortion    05/2012  . Headache(784.0)   . Sickle cell crisis [facility])     Past Surgical History:    Procedure Laterality Date  . CHOLECYSTECTOMY N/A 11/30/2014   Procedure: LAPAROSCOPIC CHOLECYSTECTOMY SINGLE SITE WITH INTRAOPERATIVE CHOLANGIOGRAM;  Surgeon: Michael Boston, MD;  Location: [facility] ORS;  Service: General;  Laterality: N/A;  . SPLENECTOMY       reports that she has never smoked. She has never used smokeless tobacco. She reports that she does not drink alcohol or use drugs.  No Known Allergies  Family History  Problem Relation Age of Onset  . Hypertension Mother   . Sickle cell anemia Sister   . Kidney disease Sister        Lupus  . Arthritis Sister   . Sickle cell anemia Sister   . Sickle cell trait Sister   . Heart disease Maternal Aunt        CABG  . Heart disease Maternal Uncle        CABG  . Lupus Sister     Prior to Admission medications   Medication Sig Start Date End Date Taking? Authorizing Provider  morphine (MS CONTIN) 60 MG 12 hr tablet Take 60 mg by mouth 2 (two) times daily as needed for pain.    Yes [provider]  oxyCODONE-acetaminophen (PERCOCET) 10-325 MG tablet Take 1 tablet by mouth every 4 (four) hours as needed for severe pain. 05/09/17  Yes [provider]  oxyCODONE-acetaminophen (PERCOCET) 5-325 MG tablet Take 2 tablets by mouth every 4 (four) hours as needed. Patient not taking: Reported on 05/17/2017 05/08/17   Julianne Rice, MD    Physical Exam: Vitals:   05/17/17 2219 05/18/17 0024  BP: 107/71 (!) 129/98  Pulse: 85 70  Resp: 18 (!) 21  Temp: 98.1 F (36.7 C)   TempSrc: Oral   SpO2: 96% 97%  Weight: 81.6 kg (180 lb)   Height: 5\' 3"  (1.6 m)     Constitutional: NAD, calm, comfortable Eyes: PERRL, lids and conjunctivae normal ENMT: Mucous membranes are moist. Posterior pharynx clear of any exudate or lesions. Neck: normal, supple, no masses, no thyromegaly Respiratory: clear to auscultation bilaterally, no wheezing, no crackles. Normal respiratory effort. No accessory muscle use.  Cardiovascular: Regular rate and  rhythm, no murmurs / rubs / gallops. No extremity edema. 2+ pedal pulses. No carotid bruits.  Abdomen: no tenderness, no masses palpated. No hepatosplenomegaly. Bowel sounds positive.  Musculoskeletal: no clubbing / cyanosis. Good ROM, no contractures. Normal muscle tone.  Skin: no rashes, lesions, ulcers on limited skin exam. Neurologic: CN 2-12 grossly intact. Sensation intact, DTR normal. Strength 5/5 in all 4.  Psychiatric: Normal judgment and insight. Alert and oriented x 3. Tearful with mildly anxious mood.   Labs on Admission: I have personally reviewed following labs and imaging studies  CBC:  Recent Labs Lab 05/17/17 1354 05/18/17 0045  WBC 9.9 10.4  NEUTROABS 5.4 4.7  HGB 10.3* 9.3*  HCT 28.6* 26.2*  MCV 81.9 82.1  PLT 639* 829*   Basic Metabolic Panel:  Recent Labs Lab 05/17/17 1354 05/18/17 0045  NA 141 139  K 3.8 3.6  CL 105 105  CO2 27 27  GLUCOSE 93 98  BUN 10 12  CREATININE 0.65 0.75  CALCIUM 9.4 9.2   GFR: Estimated Creatinine Clearance: 109.7 mL/min (by C-G formula based on SCr of 0.75 mg/dL). Liver Function Tests:  Recent Labs Lab 05/17/17 1354 05/18/17 0045  AST 21 22  ALT 16 15  ALKPHOS 65 56  BILITOT 1.2 1.5*  PROT 8.2* 7.5  ALBUMIN 4.7 4.2   No results for input(s): LIPASE, AMYLASE in the last 168 hours. No results for input(s): AMMONIA in the last 168 hours. Coagulation Profile: No results for input(s): INR, PROTIME in the last 168 hours. Cardiac Enzymes: No results for input(s): CKTOTAL, CKMB, CKMBINDEX, TROPONINI in the last 168 hours. BNP (last 3 results) No results for input(s): PROBNP in the last 8760 hours. HbA1C: No results for input(s): HGBA1C in the last 72 hours. CBG: No results for input(s): GLUCAP in the last 168 hours. Lipid Profile: No results for input(s): CHOL, HDL, LDLCALC, TRIG, CHOLHDL, LDLDIRECT in the last 72 hours. Thyroid Function Tests: No results for input(s): TSH, T4TOTAL, FREET4, T3FREE, THYROIDAB in  the last 72 hours. Anemia Panel:  Recent Labs  05/17/17 1354 05/18/17 0045  RETICCTPCT 6.8* 6.9*   Urine analysis:    Component Value Date/Time   COLORURINE YELLOW 05/07/2017 1547   APPEARANCEUR CLEAR 05/07/2017 1547   LABSPEC 1.009 05/07/2017 1547   PHURINE 7.0 05/07/2017 1547   GLUCOSEU NEGATIVE 05/07/2017 1547   HGBUR LARGE (A) 05/07/2017 1547   BILIRUBINUR NEGATIVE 05/07/2017 1547   KETONESUR NEGATIVE 05/07/2017 1547   PROTEINUR NEGATIVE 05/07/2017 1547   UROBILINOGEN 1.0 09/12/2015 1548   NITRITE NEGATIVE 05/07/2017 1547   LEUKOCYTESUR NEGATIVE 05/07/2017 1547    Radiological Exams on Admission: No results found.  EKG: Independently reviewed   Assessment/Plan Principal Problem:   Sickle cell pain crisis ([place]) Admit to MedSurg/inpatient. Continue supplemental oxygen. Continue IV hydration with D5 half MS 125 mL/hr. Toradol 30 mg IVP every 6 hours. Full dose hydromorphone PCA. Antiemetics as needed. Antihistamine as needed.  Follow-up CBC, reticulocyte count and CMP daily.  Active Problems:   Thrombocytosis ([facility]) Monitor platelet count.   DVT prophylaxis: Heparin SQ. Code Status: Full code. Family Communication:  Disposition Plan: Admit for sickle cell pain crisis treatment. Consults called:  Admission status: Inpatient/MedSurg.   Reubin Milan MD [facility] Pager [phone].  If 7PM-7AM, please contact night-coverage [email or web address] Password [email or web address]  05/18/2017, 1:40 AM

## 2017-05-19 DIAGNOSIS — D5700 Hb-SS disease with crisis, unspecified: Principal | ICD-10-CM

## 2017-05-19 DIAGNOSIS — D638 Anemia in other chronic diseases classified elsewhere: Secondary | ICD-10-CM

## 2017-05-19 DIAGNOSIS — K5900 Constipation, unspecified: Secondary | ICD-10-CM

## 2017-05-19 DIAGNOSIS — G894 Chronic pain syndrome: Secondary | ICD-10-CM

## 2017-05-19 MED ORDER — LACTULOSE 10 GM/15ML PO SOLN
30.0000 g | Freq: Once | ORAL | Status: AC
  Administered 2017-05-19: 30 g via ORAL
  Filled 2017-05-19: qty 60

## 2017-05-19 MED ORDER — MORPHINE SULFATE ER 30 MG PO TBCR
60.0000 mg | EXTENDED_RELEASE_TABLET | Freq: Two times a day (BID) | ORAL | Status: DC
  Administered 2017-05-19 – 2017-05-22 (×7): 60 mg via ORAL
  Filled 2017-05-19 (×7): qty 2

## 2017-05-19 NOTE — Progress Notes (Deleted)
Report called to RN on #W. Hospital course reviewed and questions answered.

## 2017-05-19 NOTE — Progress Notes (Signed)
Report called to RN on 3W. Hospital course reviewed and all questions answered. Transported to 1343 by wheelchair.

## 2017-05-19 NOTE — Progress Notes (Signed)
SICKLE CELL SERVICE PROGRESS NOTE  Carmen [name] YFV:[id number] DOB: [birth year] DOA: 05/17/2017 PCP: [name], MD  Assessment/Plan: Principal Problem:   Sickle cell pain crisis [facility]) Active Problems:   Thrombocytosis ([place])  1. Hb SS with crisis: Will give a one time bolus dose of Dilaudid and continue PCA at current dose. Continue Toradol and MS Contin for chronic pain.  2. Constipation: Will order lactulose x 1.  3. Chronic Pain Syndrome: Continue MS Contin. 4. Anemia of Chronic Disease: Hb stable.    Code Status: Full Code Family Communication: N/A Disposition Plan: Not yet ready for discharge  [place].  Pager [phone]. If 7PM-7AM, please contact night-coverage.  05/19/2017, 3:57 PM  LOS: 1 day   Interim History: Pt reports pain 9/10 and localized to arms, legs and back. She has used 24 mg with 48/48:demands/deliveries. Has not had a BM in several days and has no preference for laxative. Pt very rude in her tone and when informed that she has used 1/3 of medication available she asked "...what? You want me to just stay up and push this button..." I informed patient that this was just information about what was available to her and that we will see how she does in the next 24 hours with use of PCA and relief of pain.   Consultants:  None  Procedures:  None  Antibiotics:  None    Objective: Vitals:   05/19/17 0800 05/19/17 0945 05/19/17 1116 05/19/17 1345  BP:  126/72  (!) 132/91  Pulse:  61  61  Resp: 10 14 15 16   Temp:  98.8 F (37.1 C)  97.8 F (36.6 C)  TempSrc:  Oral  Axillary  SpO2: 98% 99% 98% 98%  Weight:      Height:       Weight change:   Intake/Output Summary (Last 24 hours) at 05/19/17 1557 Last data filed at 05/19/17 1521  Gross per 24 hour  Intake             3120 ml  Output             3800 ml  Net             -680 ml    .  Physical Exam General: Alert, awake, oriented x3, in apparent distress.  HEENT: [place]/AT  PEERL, EOMI, anicteric Neck: Trachea midline,  no masses, no thyromegal,y no JVD, no carotid bruit OROPHARYNX:  Moist, No exudate/ erythema/lesions.  Heart: Regular rate and rhythm, without murmurs, rubs, gallops, PMI non-displaced, no heaves or thrills on palpation.  Lungs: Clear to auscultation, no wheezing or rhonchi noted. No increased vocal fremitus resonant to percussion  Abdomen: Soft, nontender, nondistended, positive bowel sounds, no masses no hepatosplenomegaly noted.  Neuro: No focal neurological deficits noted cranial nerves II through XII grossly intact.  Strength at baseline in bilateral upper and lower extremities. Musculoskeletal: No warmth swelling or erythema around joints, no spinal tenderness noted. Psychiatric: Patient alert and oriented x3, good insight and cognition, good recent to remote recall.    Data Reviewed: Basic Metabolic Panel:  Recent Labs Lab 05/17/17 1354 05/18/17 0045  NA 141 139  K 3.8 3.6  CL 105 105  CO2 27 27  GLUCOSE 93 98  BUN 10 12  CREATININE 0.65 0.75  CALCIUM 9.4 9.2   Liver Function Tests:  Recent Labs Lab 05/17/17 1354 05/18/17 0045  AST 21 22  ALT 16 15  ALKPHOS 65 56  BILITOT 1.2 1.5*  PROT  8.2* 7.5  ALBUMIN 4.7 4.2   No results for input(s): LIPASE, AMYLASE in the last 168 hours. No results for input(s): AMMONIA in the last 168 hours. CBC:  Recent Labs Lab 05/17/17 1354 05/18/17 0045  WBC 9.9 10.4  NEUTROABS 5.4 4.7  HGB 10.3* 9.3*  HCT 28.6* 26.2*  MCV 81.9 82.1  PLT 639* 573*   Cardiac Enzymes: No results for input(s): CKTOTAL, CKMB, CKMBINDEX, TROPONINI in the last 168 hours. BNP (last 3 results) No results for input(s): BNP in the last 8760 hours.  ProBNP (last 3 results) No results for input(s): PROBNP in the last 8760 hours.  CBG: No results for input(s): GLUCAP in the last 168 hours.  No results found for this or any previous visit (from the past 240 hour(s)).   Studies: No results  found.  Scheduled Meds: . heparin  5,000 Units Subcutaneous Q8H  . HYDROmorphone   Intravenous Q4H  . morphine  60 mg Oral BID  . pantoprazole  40 mg Oral Daily  . senna-docusate  1 tablet Oral BID   Continuous Infusions: . dextrose 5 % and 0.45% NaCl 125 mL/hr at 05/19/17 1115  . diphenhydrAMINE (BENADRYL) IVPB(SICKLE CELL ONLY)      Principal Problem:   Sickle cell pain crisis ([place]) Active Problems:   Thrombocytosis ([facility])    In excess of 25 minutes spent during this visit. Greater than 50% involved face to face contact with the patient for assessment, counseling and coordination of care.

## 2017-05-20 DIAGNOSIS — F609 Personality disorder, unspecified: Secondary | ICD-10-CM

## 2017-05-20 DIAGNOSIS — D473 Essential (hemorrhagic) thrombocythemia: Secondary | ICD-10-CM

## 2017-05-20 LAB — BASIC METABOLIC PANEL
Anion gap: 10 (ref 5–15)
BUN: <5 mg/dL — ABNORMAL LOW (ref 6–20)
CALCIUM: 9.3 mg/dL (ref 8.9–10.3)
CHLORIDE: 101 mmol/L (ref 101–111)
CO2: 26 mmol/L (ref 22–32)
CREATININE: 0.4 mg/dL — AB (ref 0.44–1.00)
GFR calc Af Amer: >60 mL/min (ref >60)
GFR calc non Af Amer: >60 mL/min (ref >60)
Glucose, Bld: 114 mg/dL — ABNORMAL HIGH (ref 65–99)
Potassium: 3.6 mmol/L (ref 3.5–5.1)
Sodium: 137 mmol/L (ref 135–145)

## 2017-05-20 LAB — RETICULOCYTES
RBC.: 2.93 MIL/uL — ABNORMAL LOW (ref 3.87–5.11)
RETIC CT PCT: 8.4 % — AB (ref 0.4–3.1)
Retic Count, Absolute: 246.1 10*3/uL — ABNORMAL HIGH (ref 19.0–186.0)

## 2017-05-20 LAB — CBC WITH DIFFERENTIAL/PLATELET
Basophils Absolute: 0 10*3/uL (ref 0.0–0.1)
Basophils Relative: 0 %
EOS ABS: 0.1 10*3/uL (ref 0.0–0.7)
Eosinophils Relative: 1 %
HCT: 23.7 % — ABNORMAL LOW (ref 36.0–46.0)
HEMOGLOBIN: 8.6 g/dL — AB (ref 12.0–15.0)
LYMPHS ABS: 2.4 10*3/uL (ref 0.7–4.0)
LYMPHS PCT: 28 %
MCH: 29.4 pg (ref 26.0–34.0)
MCHC: 36.3 g/dL — ABNORMAL HIGH (ref 30.0–36.0)
MCV: 80.9 fL (ref 78.0–100.0)
Monocytes Absolute: 1.1 10*3/uL — ABNORMAL HIGH (ref 0.1–1.0)
Monocytes Relative: 12 %
NEUTROS ABS: 5.2 10*3/uL (ref 1.7–7.7)
NEUTROS PCT: 59 %
Platelets: 503 10*3/uL — ABNORMAL HIGH (ref 150–400)
RBC: 2.93 MIL/uL — AB (ref 3.87–5.11)
RDW: 16.7 % — ABNORMAL HIGH (ref 11.5–15.5)
WBC: 8.8 10*3/uL (ref 4.0–10.5)

## 2017-05-20 MED ORDER — HYDROMORPHONE HCL-NACL 0.5-0.9 MG/ML-% IV SOSY
1.0000 mg | PREFILLED_SYRINGE | INTRAVENOUS | Status: AC | PRN
  Administered 2017-05-21 (×4): 1 mg via INTRAVENOUS

## 2017-05-20 MED ORDER — ASPIRIN 81 MG PO CHEW
81.0000 mg | CHEWABLE_TABLET | Freq: Every day | ORAL | Status: DC
  Administered 2017-05-20 – 2017-05-22 (×3): 81 mg via ORAL
  Filled 2017-05-20 (×3): qty 1

## 2017-05-20 MED ORDER — HYDROMORPHONE HCL-NACL 0.5-0.9 MG/ML-% IV SOSY
1.0000 mg | PREFILLED_SYRINGE | INTRAVENOUS | Status: AC
  Administered 2017-05-20 (×4): 1 mg via INTRAVENOUS
  Filled 2017-05-20 (×4): qty 2

## 2017-05-20 NOTE — Progress Notes (Signed)
SICKLE CELL SERVICE PROGRESS NOTE  Carmen [name] SNK:[id number] DOB: 1991-01-23 DOA: 05/17/2017 PCP: [name], MD  Assessment/Plan: Principal Problem:   Sickle cell pain crisis [facility] [place] [facility]) Active Problems:   Thrombocytosis ([place])  1. Hb SS with Crisis: Continue PCA at current dose. Schedule 4 clinician doses of Dilaudid and then PRN for the subsequent 24 hours. Continue Toradol.  2. Anemia of Chronic Disease: Hb stable at baseline. 3. Chronic Pain Syndrome: Continue MS Contin q 12 hours 4. Thrombocytosis: This is chronic and she has been offered ASA to prevent platelet aggegation and has refused. Will offer again today.  5. Psychosocial: Pt has a Cluster B- Borderline Personality Disorder. On this admission the issue being dramatized  is her mother's recent hospitalization. Will ask [facility] to see.     Code Status: Full Code Family Communication: N/A Disposition Plan: Not yet ready for discharge  [place].  Pager [phone]. If 7PM-7AM, please contact night-coverage.  05/20/2017, 3:28 PM  LOS: 2 days   Interim History: Pt reports pain at intensity of 10/10 and localized to arms, hips, legs and low back. She has used 26.97 mg of Dilaudid with 61/34:demands/deliveries in the last 24 hours. She is observed ambulating and performing transfers and transitional movements in and out of bed without difficulty.    Objective: Vitals:   05/20/17 0800 05/20/17 1037 05/20/17 1043 05/20/17 1521  BP:   139/61 127/73  Pulse:   (!) 58 63  Resp: 13 15 16 18   Temp:   98 F (36.7 C) 98.2 F (36.8 C)  TempSrc:   Oral Oral  SpO2: 98% 100% 100% 100%  Weight:      Height:       Weight change:   Intake/Output Summary (Last 24 hours) at 05/20/17 1528 Last data filed at 05/20/17 1521  Gross per 24 hour  Intake                0 ml  Output              700 ml  Net             -700 ml     Physical Exam General: Alert, awake, oriented x3, in no acute distress.  HEENT:  [place]/AT PEERL, EOMI, anicteric Neck: Trachea midline,  no masses, no thyromegal,y no JVD, no carotid bruit OROPHARYNX:  Moist, No exudate/ erythema/lesions.  Heart: Regular rate and rhythm, without murmurs, rubs, gallops, PMI non-displaced, no heaves or thrills on palpation.  Lungs: Clear to auscultation, no wheezing or rhonchi noted. No increased vocal fremitus resonant to percussion  Abdomen: Soft, nontender, nondistended, positive bowel sounds, no masses no hepatosplenomegaly noted. Neuro: No focal neurological deficits noted cranial nerves II through XII grossly intact.  Strength at baseline in bilateral upper and lower extremities. Musculoskeletal: No warmth swelling or erythema around joints, no spinal tenderness noted. Psychiatric: Patient alert and oriented x3, good insight and cognition, good recent to remote recall.     Data Reviewed: Basic Metabolic Panel:  Recent Labs Lab 05/17/17 1354 05/18/17 0045 05/20/17 1124  NA 141 139 137  K 3.8 3.6 3.6  CL 105 105 101  CO2 27 27 26   GLUCOSE 93 98 114*  BUN 10 12 <5*  CREATININE 0.65 0.75 0.40*  CALCIUM 9.4 9.2 9.3   Liver Function Tests:  Recent Labs Lab 05/17/17 1354 05/18/17 0045  AST 21 22  ALT 16 15  ALKPHOS 65 56  BILITOT 1.2 1.5*  PROT 8.2* 7.5  ALBUMIN 4.7 4.2   No results for input(s): LIPASE, AMYLASE in the last 168 hours. No results for input(s): AMMONIA in the last 168 hours. CBC:  Recent Labs Lab 05/17/17 1354 05/18/17 0045 05/20/17 1124  WBC 9.9 10.4 8.8  NEUTROABS 5.4 4.7 5.2  HGB 10.3* 9.3* 8.6*  HCT 28.6* 26.2* 23.7*  MCV 81.9 82.1 80.9  PLT 639* 573* 503*   Cardiac Enzymes: No results for input(s): CKTOTAL, CKMB, CKMBINDEX, TROPONINI in the last 168 hours. BNP (last 3 results) No results for input(s): BNP in the last 8760 hours.  ProBNP (last 3 results) No results for input(s): PROBNP in the last 8760 hours.  CBG: No results for input(s): GLUCAP in the last 168 hours.  No results  found for this or any previous visit (from the past 240 hour(s)).   Studies: No results found.  Scheduled Meds: . heparin  5,000 Units Subcutaneous Q8H  . HYDROmorphone   Intravenous Q4H  .  HYDROmorphone (DILAUDID) injection  1 mg Intravenous Q2H  . morphine  60 mg Oral BID  . pantoprazole  40 mg Oral Daily  . senna-docusate  1 tablet Oral BID   Continuous Infusions: . dextrose 5 % and 0.45% NaCl 1,000 mL (05/20/17 1128)  . diphenhydrAMINE (BENADRYL) IVPB(SICKLE CELL ONLY)      Principal Problem:   Sickle cell pain crisis ([place]) Active Problems:   Thrombocytosis ([facility])       In excess of 25 minutes spent during this visit. Greater than 50% involved face to face contact with the patient for assessment, counseling and coordination of care.

## 2017-05-21 DIAGNOSIS — R229 Localized swelling, mass and lump, unspecified: Secondary | ICD-10-CM

## 2017-05-21 MED ORDER — KETOROLAC TROMETHAMINE 30 MG/ML IJ SOLN
30.0000 mg | Freq: Four times a day (QID) | INTRAMUSCULAR | Status: DC | PRN
  Administered 2017-05-22: 30 mg via INTRAMUSCULAR
  Filled 2017-05-21: qty 1

## 2017-05-21 MED ORDER — SODIUM CHLORIDE 0.9% FLUSH: 10.0000 mL | INTRAVENOUS | Status: DC | PRN

## 2017-05-21 NOTE — Progress Notes (Signed)
Peripherally Inserted Central Catheter/Midline Placement  The IV Nurse has discussed with the patient and/or persons authorized to consent for the patient, the purpose of this procedure and the potential benefits and risks involved with this procedure.  The benefits include less needle sticks, lab draws from the catheter, and the patient may be discharged home with the catheter. Risks include, but not limited to, infection, bleeding, blood clot (thrombus formation), and puncture of an artery; nerve damage and irregular heartbeat and possibility to perform a PICC exchange if needed/ordered by physician.  Alternatives to this procedure were also discussed.  B[employer] Power PICC patient education guide, fact sheet on infection prevention and patient information card has been provided to patient /or left at bedside.    PICC/Midline Placement Documentation  PICC Double Lumen 05/21/17 PICC Right Brachial 35 cm 0 cm (Active)  Indication for Insertion or Continuance of Line Poor Vasculature-patient has had multiple peripheral attempts or PIVs lasting less than 24 hours 05/21/2017  3:55 PM  Exposed Catheter (cm) 0 cm 05/21/2017  3:55 PM  Site Assessment Clean;Dry;Intact 05/21/2017  3:55 PM  Lumen #1 Status Blood return noted;Flushed;Saline locked 05/21/2017  3:55 PM  Lumen #2 Status Blood return noted;Flushed;Saline locked 05/21/2017  3:55 PM  Dressing Type Transparent 05/21/2017  3:55 PM  Dressing Status Clean;Dry;Intact;Antimicrobial disc in place 05/21/2017  3:55 PM  Dressing Intervention New dressing 05/21/2017  3:55 PM  Dressing Change Due 05/28/17 05/21/2017  3:55 PM       Aldona Lento L 05/21/2017, 4:11 PM

## 2017-05-21 NOTE — Progress Notes (Signed)
Radiology unable to complete ordered scan in house, must be seen at breast center in outpt setting. Dr. Doreene Burke notified.

## 2017-05-21 NOTE — Progress Notes (Signed)
Patient ID: Carmen [name], female   DOB: [birth year], 26 y.o.   MRN: [id number] Subjective:  Pt continues to report pain intensity of 10/10 and localized to arms, hips, legs and low back. She is observed ambulating and performing transfers and transitional movements in and out of bed without difficulty. She was tearful this morning, saying her left breast hurts and she can feel a lump there. She denies any nipple discharge or redness. She has no fever, no chest pain, no SOB.  Objective:  Vital signs in last 24 hours:  Vitals:   05/21/17 0400 05/21/17 0613 05/21/17 0914 05/21/17 1024  BP:  136/83  126/75  Pulse:  83  78  Resp: 14 16 17 12   Temp:  99.3 F (37.4 C)  98.7 F (37.1 C)  TempSrc:  Oral  Oral  SpO2: 100% 94% 95% 95%  Weight:      Height:        Intake/Output from previous day:   Intake/Output Summary (Last 24 hours) at 05/21/17 1057 Last data filed at 05/21/17 1024  Gross per 24 hour  Intake             4875 ml  Output             1400 ml  Net             3475 ml    Physical Exam: General: Alert, awake, oriented x3, in no acute distress.  HEENT: [place]/AT PEERL, EOMI Neck: Trachea midline,  no masses, no thyromegal,y no JVD, no carotid bruit OROPHARYNX:  Moist, No exudate/ erythema/lesions.  Heart: Regular rate and rhythm, without murmurs, rubs, gallops, PMI non-displaced, no heaves or thrills on palpation.  Lungs: Clear to auscultation, no wheezing or rhonchi noted. No increased vocal fremitus resonant to percussion  Abdomen: Soft, nontender, nondistended, positive bowel sounds, no masses no hepatosplenomegaly noted..  Neuro: No focal neurological deficits noted cranial nerves II through XII grossly intact. DTRs 2+ bilaterally upper and lower extremities. Strength 5 out of 5 in bilateral upper and lower extremities. Musculoskeletal: No warm swelling or erythema around joints, no spinal tenderness noted. Psychiatric: Patient alert and oriented x3, good insight and  cognition, good recent to remote recall. Lymph node survey: No cervical axillary or inguinal lymphadenopathy noted. Breast Exam: Left breast no abn detected. Right breast slightly tender, a freely mobile about 2x2 cm firm lump felt, no erythema, no nipple discharge.  Lab Results:  Basic Metabolic Panel:    Component Value Date/Time   NA 137 05/20/2017 1124   K 3.6 05/20/2017 1124   CL 101 05/20/2017 1124   CO2 26 05/20/2017 1124   BUN <5 (L) 05/20/2017 1124   CREATININE 0.40 (L) 05/20/2017 1124   GLUCOSE 114 (H) 05/20/2017 1124   CALCIUM 9.3 05/20/2017 1124   CBC:    Component Value Date/Time   WBC 8.8 05/20/2017 1124   HGB 8.6 (L) 05/20/2017 1124   HCT 23.7 (L) 05/20/2017 1124   PLT 503 (H) 05/20/2017 1124   MCV 80.9 05/20/2017 1124   NEUTROABS 5.2 05/20/2017 1124   LYMPHSABS 2.4 05/20/2017 1124   MONOABS 1.1 (H) 05/20/2017 1124   EOSABS 0.1 05/20/2017 1124   BASOSABS 0.0 05/20/2017 1124    No results found for this or any previous visit (from the past 240 hour(s)).  Studies/Results: No results found.  Medications: Scheduled Meds: . aspirin  81 mg Oral Daily  . heparin  5,000 Units Subcutaneous Q8H  . HYDROmorphone  Intravenous Q4H  . morphine  60 mg Oral BID  . pantoprazole  40 mg Oral Daily  . senna-docusate  1 tablet Oral BID   Continuous Infusions: . dextrose 5 % and 0.45% NaCl 125 mL/hr at 05/21/17 1040  . 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  Continue scheduled clinician doses of Dilaudid and then PRN. Continue Toradol.  2. Anemia of Chronic Disease: Hb stable at baseline. 3. Chronic Pain Syndrome: Continue MS Contin q 12 hours 4. Thrombocytosis: This is chronic and she has been offered ASA to prevent platelet aggegation and has refused. Will offer again today. 5. Right Breast Lump and Pain: Freely mobile 2x2 right breast lump, possibly benign cyst or fibroadenoma. Will order Ultrasound of Right Breast for further evaluation.  Code Status: Full Code Family Communication: N/A Disposition Plan: Not yet ready for discharge  Anija Brickner  If 7PM-7AM, please contact night-coverage.  05/21/2017, 10:57 AM  LOS: 3 days

## 2017-05-22 ENCOUNTER — Emergency Department (HOSPITAL_COMMUNITY)
Admission: EM | Admit: 2017-05-22 | Discharge: 2017-05-22 | Disposition: A | Discharge status: Home / Self Care | Payer: Medicaid Other | Source: Home / Self Care | Attending: Emergency Medicine | Admitting: Emergency Medicine

## 2017-05-22 ENCOUNTER — Emergency Department (HOSPITAL_COMMUNITY): Payer: Medicaid Other

## 2017-05-22 ENCOUNTER — Encounter (HOSPITAL_COMMUNITY): Payer: Self-pay | Admitting: Emergency Medicine

## 2017-05-22 DIAGNOSIS — D5700 Hb-SS disease with crisis, unspecified: Secondary | ICD-10-CM

## 2017-05-22 MED ORDER — DIPHENHYDRAMINE HCL 50 MG/ML IJ SOLN: 25.0000 mg | Freq: Once | INTRAMUSCULAR | Status: DC

## 2017-05-22 MED ORDER — LEVOFLOXACIN 500 MG PO TABS: 500.0000 mg | ORAL_TABLET | Freq: Every day | ORAL | 0 refills | Status: DC

## 2017-05-22 MED ORDER — KETOROLAC TROMETHAMINE 15 MG/ML IJ SOLN
15.0000 mg | Freq: Once | INTRAMUSCULAR | Status: AC
  Administered 2017-05-22: 15 mg via INTRAMUSCULAR
  Filled 2017-05-22: qty 1

## 2017-05-22 MED ORDER — ACETAMINOPHEN 325 MG PO TABS
650.0000 mg | ORAL_TABLET | Freq: Once | ORAL | Status: AC
  Administered 2017-05-22: 650 mg via ORAL
  Filled 2017-05-22: qty 2

## 2017-05-22 MED ORDER — ONDANSETRON HCL 4 MG/2ML IJ SOLN: 4.0000 mg | INTRAMUSCULAR | Status: DC | PRN

## 2017-05-22 MED ORDER — HYDROMORPHONE HCL 1 MG/ML IJ SOLN: 2.0000 mg | INTRAMUSCULAR | Status: AC

## 2017-05-22 MED ORDER — LEVOFLOXACIN 750 MG PO TABS
750.0000 mg | ORAL_TABLET | Freq: Once | ORAL | Status: AC
  Administered 2017-05-22: 750 mg via ORAL
  Filled 2017-05-22: qty 1

## 2017-05-22 MED ORDER — HYDROMORPHONE HCL 1 MG/ML IJ SOLN
2.0000 mg | INTRAMUSCULAR | Status: AC
  Administered 2017-05-22: 2 mg via SUBCUTANEOUS
  Filled 2017-05-22: qty 2

## 2017-05-22 MED ORDER — SODIUM CHLORIDE 0.45 % IV SOLN: INTRAVENOUS | Status: DC

## 2017-05-22 MED ORDER — HYDROMORPHONE HCL 1 MG/ML IJ SOLN: 2.0000 mg | Freq: Once | INTRAMUSCULAR | Status: DC

## 2017-05-22 MED ORDER — HYDROMORPHONE HCL-NACL 0.5-0.9 MG/ML-% IV SOSY
1.0000 mg | PREFILLED_SYRINGE | INTRAVENOUS | Status: DC | PRN
Start: 2017-05-22 — End: 2017-05-22

## 2017-05-22 NOTE — ED Notes (Signed)
Calling IV team regarding delay.

## 2017-05-22 NOTE — ED Notes (Signed)
Bed: WA03 Expected date:  Expected time:  Means of arrival:  Comments: 

## 2017-05-22 NOTE — ED Notes (Signed)
Pt difficult IV sticks with prior PICC line needed for access.  Notified for IV team

## 2017-05-22 NOTE — ED Provider Notes (Addendum)
Pt with diffuse body pain and also CP. Hx of sickle cell anemia and just admitted for refractory pain. She left AMA because she felt like she was being "harassed" by staff. Chest pain new since then but other symptoms stable. She is concerned CP may be from PICC. She points to R anterior chest below clavicle. Says pain comes up her R arm. No dyspnea. No fever or chills.  RUE with adhesive in several areas and evidence of recent venipunctures, but no concerning findings. Lungs clear and no increased WOB. CXR noted. I do not think this is pneumonia. There is no obvious radiographic complication of PICC line placement. She is afebrile and oxygen saturations are good on room air.  She is tachycardic though. She can carry a conversation without apparent respiratory difficulty. No signs/symptoms of DVT. Aside from rate, EKG similar to previous.   Pt is extremely difficult to obtain IV access and perform phlebotomy as evidence by needing a PICC during this recent hospitalization. Her complaints are unchanged aside from CP. I do not think this is ACS, PE, dissection, clinically not pneumonia and no obvious complication of PICC line. She just had labs two days ago which I reviewed. They do not appear markedly changed from her baseline.   This is a pain management issue. Will treat her with IM medications. Consideration for port-placement if this young patient who will continue to need frequent IV access and venipuncture.    8:46 PM Developed fever in ED which is the likely explanation of the tachycardia. Will cover for possible pneumonia although not clinically convinced. Check UA. She has no urinary complaints though. Will cover with levaquin because of her regular hospitalizations. This should also cover if she happens to have UTI.    Virgel Manifold, MD 05/22/17 2049

## 2017-05-22 NOTE — ED Provider Notes (Signed)
[place] DEPT Provider Note   CSN: [id number] Arrival date & time: 05/22/17  1415     History   Chief Complaint Chief Complaint  Patient presents with  . Chest Pain    HPI Carmen [name] is a 26 y.o. female Presenting with right forearm pain after will have her PICC line and right-sided chest pain which is worse with coughing and deep breaths. She just left AMA from this hospital being treated for sickle cell crisis. She reports that she is still having pain in all extremities and her back but now right-sided chest pain is new as she was attempting to drive to a different hospital.the pain radiates from her wrist to elbow shooting pain. She denies any numbness or weakness. Denies history of DVT/PE, malignancy, recent surgery, prolonged immobilization, estrogen use, hemoptysis, calf pain or leg swelling. She reports being followed by Dr. Noah Delaine for sickle cell. She denies any other medical problems and is typically well controlled with home medications. She feels that this time it took a lot longer to experience improvement in her symptoms.  HPI  Past Medical History:  Diagnosis Date  . Abortion    05/2012  . Headache(784.0)   . Sickle cell crisis [facility])     Patient Active Problem List   Diagnosis Date Noted  . Lump   . Hb-SS disease with crisis ([place]) 04/27/2017  . 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 Sickle cell anemia ([place]) 08/15/2016  . Breast lump 08/15/2016  . Anemia of chronic disease   . 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 S/P laparoscopic cholecystectomy 11/30/2014  . Hypokalemia 11/28/2014  . Leukocytosis 11/22/2014  . Thrombocytosis ([place]) 11/22/2014    Past Surgical History:  Procedure Laterality Date  . CHOLECYSTECTOMY N/A 11/30/2014   Procedure: LAPAROSCOPIC CHOLECYSTECTOMY SINGLE SITE WITH INTRAOPERATIVE CHOLANGIOGRAM;  Surgeon: Michael Boston, MD;  Location: [facility];  Service: General;  Laterality: N/A;  .  SPLENECTOMY      OB History    Gravida Para Term Preterm AB Living   1       1     SAB TAB Ectopic Multiple Live Births                   Home Medications    Prior to Admission medications   Medication Sig Start Date End Date Taking? Authorizing Provider  morphine (MS CONTIN) 60 MG 12 hr tablet Take 60 mg by mouth 2 (two) times daily as needed for pain.    Yes [provider]  oxyCODONE-acetaminophen (PERCOCET) 10-325 MG tablet Take 1 tablet by mouth every 4 (four) hours as needed for severe pain. 05/09/17  Yes [provider]  levofloxacin (LEVAQUIN) 500 MG tablet Take 1 tablet (500 mg total) by mouth daily. 05/22/17   Virgel Manifold, MD  oxyCODONE-acetaminophen (PERCOCET) 5-325 MG tablet Take 2 tablets by mouth every 4 (four) hours as needed. Patient not taking: Reported on 05/22/2017 05/08/17   Julianne Rice, MD    Family History Family History  Problem Relation Age of Onset  . Hypertension Mother   . Sickle cell anemia Sister   . Kidney disease Sister        Lupus  . Arthritis Sister   . Sickle cell anemia Sister   . Sickle cell trait Sister   . Heart disease Maternal Aunt        CABG  . Heart disease Maternal Uncle  CABG  . Lupus Sister     Social History Social History  Substance Use Topics  . Smoking status: Never Smoker  . Smokeless tobacco: Never Used  . Alcohol use No     Allergies   Patient has no known allergies.   Review of Systems Review of Systems  Constitutional: Negative for chills and fever.  HENT: Negative for congestion, ear pain, facial swelling and sore throat.   Eyes: Negative for pain and visual disturbance.  Respiratory: Positive for cough. Negative for choking, chest tightness, shortness of breath, wheezing and stridor.        She reports a cough since she left the hospital  Cardiovascular: Positive for chest pain. Negative for palpitations and leg swelling.  Gastrointestinal: Negative for abdominal  distention, abdominal pain, blood in stool, diarrhea, nausea and vomiting.  Genitourinary: Negative for difficulty urinating, dysuria and hematuria.  Musculoskeletal: Positive for back pain and myalgias. Negative for arthralgias, gait problem, joint swelling, neck pain and neck stiffness.  Skin: Negative for color change, pallor, rash and wound.  Neurological: Negative for dizziness, seizures, syncope, weakness, light-headedness, numbness and headaches.     Physical Exam Updated Vital Signs BP 136/90   Pulse 91   Temp (!) 100.6 F (38.1 C) (Oral)   Resp 18   LMP 05/05/2017   SpO2 96%   Physical Exam  Constitutional: She appears well-developed and well-nourished. No distress.  Afebrile, nontoxic-appearing sitting comfortably in bed no acute distress.  HENT:  Head: Normocephalic and atraumatic.  Eyes: Conjunctivae are normal. Right eye exhibits no discharge. Left eye exhibits no discharge.  Neck: Normal range of motion. Neck supple.  Cardiovascular: Normal rate, regular rhythm, normal heart sounds and intact distal pulses.   No murmur heard. Pulmonary/Chest: Effort normal and breath sounds normal. No respiratory distress. She has no wheezes. She has no rales. She exhibits tenderness.  Right-sided chest discomfort is reproducible on palpation.  Abdominal: Soft. She exhibits no distension and no mass. There is no tenderness. There is no rebound and no guarding.  Musculoskeletal: Normal range of motion. She exhibits no edema or deformity.  Neurological: She is alert. No sensory deficit.  Skin: Skin is warm and dry. Capillary refill takes less than 2 seconds. No rash noted. She is not diaphoretic. No erythema. No pallor.  Psychiatric: She has a normal mood and affect.  Nursing note and vitals reviewed.    ED Treatments / Results  Labs (all labs ordered are listed, but only abnormal results are displayed) Labs Reviewed  URINALYSIS, ROUTINE W REFLEX MICROSCOPIC    EKG  EKG  Interpretation  Date/Time:  Sunday May 22 2017 14:27:46 EDT Ventricular Rate:  119 PR Interval:    QRS Duration: 91 QT Interval:  331 QTC Calculation: 466 R Axis:   37 Text Interpretation:  Sinus tachycardia RSR' in V1 or V2, right VCD or RVH Baseline wander in lead(s) V2 Confirmed by Virgel Manifold [id number]) on 05/22/2017 7:07:23 PM       Radiology Dg Chest 2 View  Result Date: 05/22/2017 CLINICAL DATA:  Chest pain beginning today with weakness. EXAM: CHEST  2 VIEW COMPARISON:  03/28/2017 FINDINGS: Lungs are somewhat hypoinflated with subtle patchy density left base as cannot exclude atelectasis or early infection. No evidence of effusion. Cardiomediastinal silhouette and remainder of the exam is unchanged. IMPRESSION: Subtle patchy hazy density in the left base at likely atelectasis, although cannot exclude early infection. Electronically Signed   By: Marin Olp M.D.   On: 05/22/2017  14:43    Procedures Procedures (including critical care time)  Medications Ordered in ED Medications  0.45 % sodium chloride infusion (not administered)  HYDROmorphone (DILAUDID) injection 2 mg (not administered)    Or  HYDROmorphone (DILAUDID) injection 2 mg (not administered)  diphenhydrAMINE (BENADRYL) injection 25 mg (not administered)  ondansetron (ZOFRAN) injection 4 mg (not administered)  HYDROmorphone (DILAUDID) injection 2 mg ( Intravenous See Alternative 05/22/17 1836)    Or  HYDROmorphone (DILAUDID) injection 2 mg (2 mg Subcutaneous Given 05/22/17 1836)  ketorolac (TORADOL) 15 MG/ML injection 15 mg (15 mg Intramuscular Given 05/22/17 2024)  levofloxacin (LEVAQUIN) tablet 750 mg (750 mg Oral Given 05/22/17 2024)  acetaminophen (TYLENOL) tablet 650 mg (650 mg Oral Given 05/22/17 2024)     Initial Impression / Assessment and Plan / ED Course  I have reviewed the triage vital signs and the nursing notes.  Pertinent labs & imaging results that were available during my care of the patient  were reviewed by me and considered in my medical decision making (see chart for details).     Patient presents with new onset cough, right-sided pleuritic chest pain and right arm pain she attributes to the removal of her PICC line earlier today. She was admitted for sickle cell pain over the last 5 days and left AMA earlier today. She reports that she was on her way to a different hospital when she experienced discomfort and was told to go to the emergency department by a friend.  Patient had been refusing blood draw for labs in triage.  On exam, patient right-sided chest discomfort is reproducible on palpation. Full rom of the arm without swelling or erythema at the post site. Strong grips bilaterally.  On reassessment, patient was sleeping comfortably before initial dose of analgesia was given.  Patient was discussed with Dr. Wilson Singer who has seen patient and recommended IM pain medications and discharge home with follow up.  Patient was given IM pain medication due to difficulties with IV access.  On second reassessment, patient reported no improvement in symptoms and worsening.  Patient care transferred at end of shift to Dr. Wilson Singer pending improvement and discharge home.  Final Clinical Impressions(s) / ED Diagnoses   Final diagnoses:  Sickle cell anemia with pain [facility])    New Prescriptions Discharge Medication List as of 05/22/2017 10:12 PM    START taking these medications   Details  levofloxacin (LEVAQUIN) 500 MG tablet Take 1 tablet (500 mg total) by mouth daily., Starting Sun 05/22/2017, Print         Emeline General, PA-C 05/22/17 2235    Virgel Manifold, MD 05/29/17 1218

## 2017-05-22 NOTE — Discharge Summary (Signed)
Physician Discharge Summary  Carmen [name] ZES:[id number] DOB: [birth year] DOA: 05/17/2017  PCP: [name], MD  Admit date: 05/17/2017  Discharge date: 05/22/2017 Left AMA  Discharge Diagnoses:  Principal Problem:   Sickle cell pain crisis [facility]) Active Problems:   Thrombocytosis ([place])   Lump  Discharge Condition: Stable  Disposition:   Diet: Regular  Wt Readings from Last 3 Encounters:  05/21/17 77.2 kg (170 lb 3.1 oz)  05/17/17 81.6 kg (180 lb)  05/09/17 81.6 kg (180 lb)    History of present illness:  Carmen [name] is a 26 y.o. female with medical history significant of sickle cell anemia who is returning for the second time today to the emergency department after waking up around 03:30 yesterday morning with complaints of progressively worse lower and upper extremities pain. She was seen earlier in the emergency department and was given the choice to be admitted versus going home. She went home and took some of her Percocet tablets. She is also on MS Contin 60 mg twice a day without significant results. She had to return to the emergency department due to re-exacerbation of pain. She currently rates the pain about a 10 over 10. She feels mildly dyspneic. She denies fever, chills, sore throat, productive cough,  chest pain, palpitations, diaphoresis, pitting edema lower extremities, abdominal pain, nausea, emesis, diarrhea, constipation, melena, hematochezia, dysuria, frequency or hematuria. She denies skin rashes or pruritus.  ED Course: Initial vital signs in the emergency department show a temperature 98.40F, pulse of 85, blood pressure 107/71 mmHg, respirations 18 and O2 sat 96% on room air. Hemoglobin level this evening was 9.3 g/dL, reticulocyte count 6.9% WBC 10.4 and platelets 573. Her CMP is normal with the exception of a mildly elevated bilirubin at 1.5 mg/dL. She received half normal saline 1 L bolus, hydromorphone 2.5 total milligrams and Toradol 30 mg IVP in the  emergency department.  Hospital Course:  Patient was managed with IVF, IV Toradol and IV Dilaudid via PCA and clinician assisted doses. She continued to report pain 10/10 and per floor nurses and staff, she constantly asked for more IV dilaudid even when she was not due. She also complained of breast lump on the right during this admission, breast ultrasound was ordered but yet to be done. Patient verbalized dissatisfaction with her treatment and what she called "staff harrassment" and left the hospital admission AMA.   Discharge Exam: Vitals:   05/22/17 1006 05/22/17 1007  BP:  119/79  Pulse:  70  Resp: 16 16  Temp:  98.4 F (36.9 C)   Vitals:   05/22/17 0400 05/22/17 0611 05/22/17 1006 05/22/17 1007  BP:  119/80  119/79  Pulse:  74  70  Resp: 10 13 16 16   Temp:  98.3 F (36.8 C)  98.4 F (36.9 C)  TempSrc:  Oral  Oral  SpO2: 96% 97% 97% 98%  Weight:      Height:        General appearance : Awake, alert, not in any distress. Speech Clear. Not toxic looking HEENT: Atraumatic and Normocephalic, pupils equally reactive to light and accomodation Neck: Supple, no JVD. No cervical lymphadenopathy.  Chest: Good air entry bilaterally, no added sounds  CVS: S1 S2 regular, no murmurs.  Abdomen: Bowel sounds present, Non tender and not distended with no gaurding, rigidity or rebound. Extremities: B/L Lower Ext shows no edema, both legs are warm to touch Neurology: Awake alert, and oriented X 3, CN II-XII intact, Non focal Skin:  No Rash  Discharge Instructions   Allergies as of 05/22/2017   No Known Allergies     Medication List    ASK your doctor about these medications   morphine 60 MG 12 hr tablet Commonly known as:  MS CONTIN Take 60 mg by mouth 2 (two) times daily as needed for pain.   oxyCODONE-acetaminophen 5-325 MG tablet Commonly known as:  PERCOCET Take 2 tablets by mouth every 4 (four) hours as needed.   oxyCODONE-acetaminophen 10-325 MG tablet Commonly known  as:  PERCOCET Take 1 tablet by mouth every 4 (four) hours as needed for severe pain.        The results of significant diagnostics from this hospitalization (including imaging, microbiology, ancillary and laboratory) are listed below for reference.    Significant Diagnostic Studies: Dg Chest 2 View  Result Date: 05/22/2017 CLINICAL DATA:  Chest pain beginning today with weakness. EXAM: CHEST  2 VIEW COMPARISON:  03/28/2017 FINDINGS: Lungs are somewhat hypoinflated with subtle patchy density left base as cannot exclude atelectasis or early infection. No evidence of effusion. Cardiomediastinal silhouette and remainder of the exam is unchanged. IMPRESSION: Subtle patchy hazy density in the left base at likely atelectasis, although cannot exclude early infection. Electronically Signed   By: Marin Olp M.D.   On: 05/22/2017 14:43    Microbiology: No results found for this or any previous visit (from the past 240 hour(s)).   Labs: Basic Metabolic Panel:  Recent Labs Lab 05/17/17 1354 05/18/17 0045 05/20/17 1124  NA 141 139 137  K 3.8 3.6 3.6  CL 105 105 101  CO2 27 27 26   GLUCOSE 93 98 114*  BUN 10 12 <5*  CREATININE 0.65 0.75 0.40*  CALCIUM 9.4 9.2 9.3   Liver Function Tests:  Recent Labs Lab 05/17/17 1354 05/18/17 0045  AST 21 22  ALT 16 15  ALKPHOS 65 56  BILITOT 1.2 1.5*  PROT 8.2* 7.5  ALBUMIN 4.7 4.2   No results for input(s): LIPASE, AMYLASE in the last 168 hours. No results for input(s): AMMONIA in the last 168 hours. CBC:  Recent Labs Lab 05/17/17 1354 05/18/17 0045 05/20/17 1124  WBC 9.9 10.4 8.8  NEUTROABS 5.4 4.7 5.2  HGB 10.3* 9.3* 8.6*  HCT 28.6* 26.2* 23.7*  MCV 81.9 82.1 80.9  PLT 639* 573* 503*   Cardiac Enzymes: No results for input(s): CKTOTAL, CKMB, CKMBINDEX, TROPONINI in the last 168 hours. BNP: Invalid input(s): POCBNP CBG: No results for input(s): GLUCAP in the last 168 hours.  Time coordinating discharge: 50  minutes  Signed:  Shakeisha Horine, [facility] 05/22/2017, 3:08 PM

## 2017-05-22 NOTE — ED Notes (Signed)
Bed: WA12 Expected date:  Expected time:  Means of arrival:  Comments: Triage 3 

## 2017-05-22 NOTE — ED Triage Notes (Addendum)
Pt reports she began to have CP an hour ago after leaving the hospital AMA. Pt just had PICC line removed at 1200 on R side. Pain goes up R arm to R chest.  Hx of sickle cell, but this does not feel like sickle cell pain.

## 2017-05-22 NOTE — ED Notes (Signed)
Pt said she gave a urine sample when she was in triage but it is not in triage nor in the lab.  She just went to the bathroom so it will be a while before she will be able to give [place] another sample.

## 2017-05-22 NOTE — Progress Notes (Signed)
RN called into room by patient wishing to speak to the charge nurse.  This RN greeted patient and asked what this RN could do for her.  Patient stated she does not want the current RN Assigned to her to care for her.  States she feels harassed, Orthoptist keeps asking if patient has taken her medicine.  This RN informed patient that a change in nursing assignment is not warranted as she is being provided safe and prudent nursing care.  This RN also explained that due to unit census and staffing there were two RN's on the unit today, myself and the RN currently caring for her.  This patient has previously expressed that she did not wish for this RN to care for her either.  When this RN reminded the pt of this,  Pt became irate and began insulting this RN, stating that was Friday and this is today.  This RN reiterated that at this time no changes would be made to the nursing assignment.  Patient stated she will leave and handle it how she needs to.  This RN provided patient with AMA paperwork to sign, patient requested to be able to write her own statement on the paperwork and will leave once she is done.

## 2017-05-22 NOTE — ED Notes (Signed)
Patient request lab draw w/ IV start. Triage RN made aware.

## 2017-05-22 NOTE — Progress Notes (Signed)
Giving pt her 10:00 medications and replacing her PCA dilaudid syringe. Pt asked if she has Toradol ordered, I checked and told her yes that it was ordered PRN. She states that a nurse lied to her and said it wasn't ordered. I said it might not have been approved by pharmacy yet. Pt states the MD checked the order and the nurse lied. I stated again it is a PRN medication so she had to ask for it. I had MS contin 60,replaced her Dilaudid syringe and told her I would get her Toradol. She stated she also wanted her Dilaudid 1mg   And that Dr. Doreene Burke said she could have all those medications at once. I explained I couldn't give them all to her and that she would have to wait a little while before she could get the extra dose of Dilaudid. Pt kept asking about the Toradol and the nurse lying and why can't she get all her meds now. I said what are you asking? She said what's wrong with you are you confused, I don't want you touching me,get out of my room. Alarms were beeping,I came back in and asked to be sure she had taken all her medicines. Pt wouldn't answer me. Charge nurse notified of situation and she went in to see pt.

## 2017-05-22 NOTE — Progress Notes (Signed)
AC and Dr. Doreene Burke notified of Pt leaving AMA. AMA form was signed by pt.  Picc line removed by IV team.

## 2017-05-23 ENCOUNTER — Other Ambulatory Visit: Payer: Self-pay

## 2017-05-23 ENCOUNTER — Encounter (HOSPITAL_COMMUNITY): Payer: Self-pay | Admitting: *Deleted

## 2017-05-23 ENCOUNTER — Inpatient Hospital Stay (HOSPITAL_COMMUNITY)
Admission: EM | Admit: 2017-05-23 | Discharge: 2017-05-24 | DRG: 812 | Discharge status: Against Medical Advice | Payer: Medicaid Other | Attending: Internal Medicine | Admitting: Internal Medicine

## 2017-05-23 DIAGNOSIS — R7989 Other specified abnormal findings of blood chemistry: Secondary | ICD-10-CM | POA: Diagnosis present

## 2017-05-23 DIAGNOSIS — D473 Essential (hemorrhagic) thrombocythemia: Secondary | ICD-10-CM | POA: Diagnosis not present

## 2017-05-23 DIAGNOSIS — Z9049 Acquired absence of other specified parts of digestive tract: Secondary | ICD-10-CM | POA: Diagnosis not present

## 2017-05-23 DIAGNOSIS — Z8261 Family history of arthritis: Secondary | ICD-10-CM

## 2017-05-23 DIAGNOSIS — R918 Other nonspecific abnormal finding of lung field: Secondary | ICD-10-CM | POA: Diagnosis not present

## 2017-05-23 DIAGNOSIS — D5700 Hb-SS disease with crisis, unspecified: Secondary | ICD-10-CM | POA: Diagnosis present

## 2017-05-23 DIAGNOSIS — R0789 Other chest pain: Secondary | ICD-10-CM

## 2017-05-23 DIAGNOSIS — D75839 Thrombocytosis, unspecified: Secondary | ICD-10-CM | POA: Diagnosis present

## 2017-05-23 DIAGNOSIS — Z79899 Other long term (current) drug therapy: Secondary | ICD-10-CM

## 2017-05-23 DIAGNOSIS — Z832 Family history of diseases of the blood and blood-forming organs and certain disorders involving the immune mechanism: Secondary | ICD-10-CM

## 2017-05-23 DIAGNOSIS — Z8249 Family history of ischemic heart disease and other diseases of the circulatory system: Secondary | ICD-10-CM

## 2017-05-23 DIAGNOSIS — E876 Hypokalemia: Secondary | ICD-10-CM | POA: Diagnosis present

## 2017-05-23 DIAGNOSIS — Z9081 Acquired absence of spleen: Secondary | ICD-10-CM | POA: Diagnosis not present

## 2017-05-23 DIAGNOSIS — J189 Pneumonia, unspecified organism: Secondary | ICD-10-CM | POA: Diagnosis not present

## 2017-05-23 DIAGNOSIS — Z841 Family history of disorders of kidney and ureter: Secondary | ICD-10-CM

## 2017-05-23 DIAGNOSIS — G894 Chronic pain syndrome: Secondary | ICD-10-CM | POA: Diagnosis not present

## 2017-05-23 DIAGNOSIS — R509 Fever, unspecified: Secondary | ICD-10-CM | POA: Diagnosis not present

## 2017-05-23 LAB — I-STAT TROPONIN, ED: Troponin i, poc: 0 ng/mL (ref 0.00–0.08)

## 2017-05-23 LAB — COMPREHENSIVE METABOLIC PANEL
ALT: 18 U/L (ref 14–54)
ANION GAP: 10 (ref 5–15)
AST: 21 U/L (ref 15–41)
Albumin: 4 g/dL (ref 3.5–5.0)
Alkaline Phosphatase: 59 U/L (ref 38–126)
BUN: <5 mg/dL — ABNORMAL LOW (ref 6–20)
CO2: 26 mmol/L (ref 22–32)
Calcium: 9.5 mg/dL (ref 8.9–10.3)
Chloride: 103 mmol/L (ref 101–111)
Creatinine, Ser: 0.49 mg/dL (ref 0.44–1.00)
GFR calc non Af Amer: >60 mL/min (ref >60)
Glucose, Bld: 96 mg/dL (ref 65–99)
Potassium: 3.2 mmol/L — ABNORMAL LOW (ref 3.5–5.1)
SODIUM: 139 mmol/L (ref 135–145)
Total Bilirubin: 3.4 mg/dL — ABNORMAL HIGH (ref 0.3–1.2)
Total Protein: 7.6 g/dL (ref 6.5–8.1)

## 2017-05-23 LAB — CBC WITH DIFFERENTIAL/PLATELET
BASOS PCT: 0 %
Basophils Absolute: 0 10*3/uL (ref 0.0–0.1)
Eosinophils Absolute: 0 10*3/uL (ref 0.0–0.7)
Eosinophils Relative: 0 %
HEMATOCRIT: 26.5 % — AB (ref 36.0–46.0)
HEMOGLOBIN: 9.4 g/dL — AB (ref 12.0–15.0)
LYMPHS ABS: 2.3 10*3/uL (ref 0.7–4.0)
LYMPHS PCT: 17 %
MCH: 28.8 pg (ref 26.0–34.0)
MCHC: 35.5 g/dL (ref 30.0–36.0)
MCV: 81.3 fL (ref 78.0–100.0)
MONOS PCT: 14 %
Monocytes Absolute: 2 10*3/uL — ABNORMAL HIGH (ref 0.1–1.0)
NEUTROS ABS: 9.5 10*3/uL — AB (ref 1.7–7.7)
NEUTROS PCT: 69 %
Platelets: 501 10*3/uL — ABNORMAL HIGH (ref 150–400)
RBC: 3.26 MIL/uL — ABNORMAL LOW (ref 3.87–5.11)
RDW: 16.5 % — ABNORMAL HIGH (ref 11.5–15.5)
WBC: 13.8 10*3/uL — ABNORMAL HIGH (ref 4.0–10.5)

## 2017-05-23 LAB — HIV ANTIBODY (ROUTINE TESTING W REFLEX): HIV SCREEN 4TH GENERATION: NONREACTIVE

## 2017-05-23 LAB — I-STAT BETA HCG BLOOD, ED (MC, WL, AP ONLY): I-stat hCG, quantitative: <5 m[IU]/mL (ref <5)

## 2017-05-23 LAB — RETICULOCYTES
RBC.: 3.26 MIL/uL — AB (ref 3.87–5.11)
Retic Count, Absolute: 355.3 10*3/uL — ABNORMAL HIGH (ref 19.0–186.0)
Retic Ct Pct: 10.9 % — ABNORMAL HIGH (ref 0.4–3.1)

## 2017-05-23 LAB — TROPONIN I: Troponin I: <0.03 ng/mL (ref <0.03)

## 2017-05-23 LAB — I-STAT CG4 LACTIC ACID, ED: Lactic Acid, Venous: 0.51 mmol/L (ref 0.5–1.9)

## 2017-05-23 MED ORDER — KETOROLAC TROMETHAMINE 15 MG/ML IJ SOLN
15.0000 mg | Freq: Four times a day (QID) | INTRAMUSCULAR | Status: DC
  Administered 2017-05-23 – 2017-05-24 (×6): 15 mg via INTRAVENOUS
  Filled 2017-05-23 (×6): qty 1

## 2017-05-23 MED ORDER — ENOXAPARIN SODIUM 40 MG/0.4ML ~~LOC~~ SOLN
40.0000 mg | SUBCUTANEOUS | Status: DC
  Administered 2017-05-23: 40 mg via SUBCUTANEOUS
  Filled 2017-05-23 (×3): qty 0.4

## 2017-05-23 MED ORDER — ASPIRIN 81 MG PO CHEW
81.0000 mg | CHEWABLE_TABLET | Freq: Every day | ORAL | Status: DC
Start: 2017-05-23 — End: 2017-05-24
  Administered 2017-05-23 – 2017-05-24 (×2): 81 mg via ORAL
  Filled 2017-05-23 (×2): qty 1

## 2017-05-23 MED ORDER — HYDROMORPHONE HCL 1 MG/ML IJ SOLN
2.0000 mg | INTRAMUSCULAR | Status: DC | PRN
  Administered 2017-05-23: 2 mg via INTRAVENOUS
  Filled 2017-05-23: qty 2

## 2017-05-23 MED ORDER — SENNOSIDES-DOCUSATE SODIUM 8.6-50 MG PO TABS
1.0000 | ORAL_TABLET | Freq: Two times a day (BID) | ORAL | Status: DC
  Administered 2017-05-23 – 2017-05-24 (×3): 1 via ORAL
  Filled 2017-05-23 (×4): qty 1

## 2017-05-23 MED ORDER — NALOXONE HCL 0.4 MG/ML IJ SOLN: 0.4000 mg | INTRAMUSCULAR | Status: DC | PRN

## 2017-05-23 MED ORDER — SODIUM CHLORIDE 0.9 % IV SOLN
25.0000 mg | INTRAVENOUS | Status: DC | PRN
  Filled 2017-05-23: qty 0.5

## 2017-05-23 MED ORDER — SODIUM CHLORIDE 0.9% FLUSH: 9.0000 mL | INTRAVENOUS | Status: DC | PRN

## 2017-05-23 MED ORDER — DEXTROSE 5 % IV SOLN
1.0000 g | Freq: Three times a day (TID) | INTRAVENOUS | Status: DC
  Administered 2017-05-23 – 2017-05-24 (×4): 1 g via INTRAVENOUS
  Filled 2017-05-23 (×6): qty 1

## 2017-05-23 MED ORDER — ONDANSETRON HCL 4 MG/2ML IJ SOLN
4.0000 mg | INTRAMUSCULAR | Status: DC | PRN
  Administered 2017-05-23: 4 mg via INTRAVENOUS
  Filled 2017-05-23: qty 2

## 2017-05-23 MED ORDER — MORPHINE SULFATE ER 30 MG PO TBCR
60.0000 mg | EXTENDED_RELEASE_TABLET | Freq: Two times a day (BID) | ORAL | Status: DC
  Administered 2017-05-23 – 2017-05-24 (×3): 60 mg via ORAL
  Filled 2017-05-23 (×3): qty 2

## 2017-05-23 MED ORDER — ONDANSETRON HCL 4 MG/2ML IJ SOLN: 4.0000 mg | INTRAMUSCULAR | Status: DC | PRN

## 2017-05-23 MED ORDER — DIPHENHYDRAMINE HCL 25 MG PO CAPS: 25.0000 mg | ORAL_CAPSULE | ORAL | Status: DC | PRN

## 2017-05-23 MED ORDER — CEFTRIAXONE SODIUM 1 G IJ SOLR
1.0000 g | Freq: Once | INTRAMUSCULAR | Status: AC
  Administered 2017-05-23: 1 g via INTRAVENOUS
  Filled 2017-05-23: qty 10

## 2017-05-23 MED ORDER — HYDROMORPHONE HCL 1 MG/ML IJ SOLN: 2.0000 mg | INTRAMUSCULAR | Status: DC

## 2017-05-23 MED ORDER — DEXTROSE 5 % IV SOLN
500.0000 mg | Freq: Once | INTRAVENOUS | Status: AC
  Administered 2017-05-23: 500 mg via INTRAVENOUS
  Filled 2017-05-23: qty 500

## 2017-05-23 MED ORDER — AZITHROMYCIN 500 MG IV SOLR: 500.0000 mg | INTRAVENOUS | Status: DC

## 2017-05-23 MED ORDER — POLYETHYLENE GLYCOL 3350 17 G PO PACK
17.0000 g | PACK | Freq: Every day | ORAL | Status: DC | PRN
Start: 2017-05-23 — End: 2017-05-24
  Administered 2017-05-23: 17 g via ORAL
  Filled 2017-05-23: qty 1

## 2017-05-23 MED ORDER — HYDROMORPHONE 1 MG/ML IV SOLN
INTRAVENOUS | Status: DC
  Administered 2017-05-23: 3.6 mg via INTRAVENOUS
  Administered 2017-05-23: 16:00:00 via INTRAVENOUS
  Administered 2017-05-23: 10 mg via INTRAVENOUS
  Administered 2017-05-23: 2.4 mg via INTRAVENOUS
  Administered 2017-05-24: 3 mg via INTRAVENOUS
  Administered 2017-05-24: 9.6 mg via INTRAVENOUS
  Administered 2017-05-24: 6 mg via INTRAVENOUS
  Administered 2017-05-24: 5.6 mg via INTRAVENOUS
  Filled 2017-05-23 (×2): qty 25

## 2017-05-23 MED ORDER — VANCOMYCIN HCL IN DEXTROSE 1-5 GM/200ML-% IV SOLN
1000.0000 mg | Freq: Three times a day (TID) | INTRAVENOUS | Status: DC
  Administered 2017-05-23: 1000 mg via INTRAVENOUS
  Filled 2017-05-23: qty 200

## 2017-05-23 MED ORDER — SODIUM CHLORIDE 0.45 % IV SOLN
INTRAVENOUS | Status: DC
  Administered 2017-05-23: 03:00:00 via INTRAVENOUS

## 2017-05-23 MED ORDER — ONDANSETRON HCL 4 MG PO TABS: 4.0000 mg | ORAL_TABLET | ORAL | Status: DC | PRN

## 2017-05-23 MED ORDER — HYDROMORPHONE HCL 1 MG/ML IJ SOLN
2.0000 mg | INTRAMUSCULAR | Status: AC
  Administered 2017-05-23: 2 mg via INTRAVENOUS
  Filled 2017-05-23: qty 2

## 2017-05-23 MED ORDER — HYDROMORPHONE 1 MG/ML IV SOLN
INTRAVENOUS | Status: DC
  Administered 2017-05-23: 06:00:00 via INTRAVENOUS
  Administered 2017-05-23: 9 mg via INTRAVENOUS
  Filled 2017-05-23: qty 25

## 2017-05-23 MED ORDER — KCL IN DEXTROSE-NACL 20-5-0.45 MEQ/L-%-% IV SOLN
INTRAVENOUS | Status: AC
  Administered 2017-05-23: 07:00:00 via INTRAVENOUS
  Filled 2017-05-23 (×2): qty 1000

## 2017-05-23 MED ORDER — ONDANSETRON HCL 4 MG/2ML IJ SOLN: 4.0000 mg | Freq: Four times a day (QID) | INTRAMUSCULAR | Status: DC | PRN

## 2017-05-23 MED ORDER — DIPHENHYDRAMINE HCL 25 MG PO CAPS
25.0000 mg | ORAL_CAPSULE | ORAL | Status: DC | PRN
  Administered 2017-05-23: 25 mg via ORAL
  Filled 2017-05-23: qty 1

## 2017-05-23 MED ORDER — HYDROMORPHONE HCL 1 MG/ML IJ SOLN: 2.0000 mg | INTRAMUSCULAR | Status: AC

## 2017-05-23 NOTE — ED Provider Notes (Signed)
TIME SEEN: 2:44 AM  By signing my name below, I, Ny'Kea Lewis, attest that this documentation has been prepared under the direction and in the presence of Loyda Costin, Delice Bison, DO. Electronically Signed: Lise Auer, ED Scribe. 05/23/17. 2:57 AM.  CHIEF COMPLAINT: Sickle Cell Crisis   HPI:  Carmen [name] is a 26 y.o. female with a PMHx of sickle cell, who presents to the Emergency Department complaining of gradually worsening, persistent bilaterally upper and lower extremity and back pain for the past two weeks. States that this is typical of her sickle cell pain. She notes associated fever (100.6) today and centralized chest pain that started two days ago. Pt states she has been in pain since being discharged on 7/17 from [facility] for her sickle cell pain crisis. Was given IM medications there and started on oral Levaquin for possible left lower lobe pneumonia. She reports a history of acute chest syndrome when she was younger. LMP was normal and ended on 7/10. Denies vomiting, diarrhea, dysuria or hematuria. Has had mild dry cough. Has had chills. Reports shortness of breath. No history of PE, DVT, MI, stroke.  ROS: See HPI Constitutional: + fever  Eyes: no drainage  ENT: no runny nose   Cardiovascular:  + chest pain  Resp: + SOB or cough GI: no vomiting, hematemesis, or diarrhea GU: no dysuria Integumentary: no rash  Allergy: no hives  Musculoskeletal: no leg swelling,+ BLE/BUE/back pain  Neurological: no slurred speech ROS otherwise negative  PAST MEDICAL HISTORY/PAST SURGICAL HISTORY:  Past Medical History:  Diagnosis Date  . Abortion    05/2012  . Headache(784.0)   . Sickle cell crisis [facility])     MEDICATIONS:  Prior to Admission medications   Medication Sig Start Date End Date Taking? Authorizing Provider  levofloxacin (LEVAQUIN) 500 MG tablet Take 1 tablet (500 mg total) by mouth daily. 05/22/17   Virgel Manifold, MD  morphine (MS CONTIN) 60 MG 12 hr tablet Take 60 mg by mouth  2 (two) times daily as needed for pain.     [provider]  oxyCODONE-acetaminophen (PERCOCET) 10-325 MG tablet Take 1 tablet by mouth every 4 (four) hours as needed for severe pain. 05/09/17   [provider]  oxyCODONE-acetaminophen (PERCOCET) 5-325 MG tablet Take 2 tablets by mouth every 4 (four) hours as needed. Patient not taking: Reported on 05/22/2017 05/08/17   Julianne Rice, MD    ALLERGIES:  No Known Allergies  SOCIAL HISTORY:  Social History  Substance Use Topics  . Smoking status: Never Smoker  . Smokeless tobacco: Never Used  . Alcohol use No    FAMILY HISTORY: Family History  Problem Relation Age of Onset  . Hypertension Mother   . Sickle cell anemia Sister   . Kidney disease Sister        Lupus  . Arthritis Sister   . Sickle cell anemia Sister   . Sickle cell trait Sister   . Heart disease Maternal Aunt        CABG  . Heart disease Maternal Uncle        CABG  . Lupus Sister     EXAM: BP 131/90   Pulse 97   Temp 98.7 F (37.1 C)   Resp 18   Ht 5\' 3"  (1.6 m)   Wt 175 lb (79.4 kg)   LMP 05/05/2017   SpO2 98%   BMI 31.00 kg/m  CONSTITUTIONAL: Alert and oriented and responds appropriately to questions. Well-appearing; well-nourished HEAD: Normocephalic EYES:  Conjunctivae clear, pupils appear equal, EOMI ENT: normal nose; moist mucous membranes NECK: Supple, no meningismus, no nuchal rigidity, no LAD  CARD: RRR; S1 and S2 appreciated; no murmurs, no clicks, no rubs, no gallops RESP: Normal chest excursion without splinting or tachypnea; breath sounds clear and equal bilaterally; no wheezes, no rhonchi, no rales, no hypoxia or respiratory distress, speaking full sentences ABD/GI: Normal bowel sounds; non-distended; soft, non-tender, no rebound, no guarding, no peritoneal signs, no hepatosplenomegaly BACK:  The back appears normal and is non-tender to palpation, there is no CVA tenderness EXT: Normal ROM in all joints; non-tender to  palpation; no edema; normal capillary refill; no cyanosis, no calf tenderness or swelling    SKIN: Normal color for age and race; warm; no rash NEURO: Moves all extremities equally PSYCH: The patient's mood and manner are appropriate. Grooming and personal hygiene are appropriate.  MEDICAL DECISION MAKING: Patient here with continued pain despite receiving IM medications at with [facility] hospital less than 12 hours ago. Describes this as her normal sickle cell pain. Her labs show leukocytosis with left shift. Hemoglobin is 9.4. A chest x-ray done last night at [facility] showed a possible pneumonia. Given fever, leukocytosis with left shift, chest pain or shortness of breath, I'm concerned this could be pneumonia and could be beginning of acute chest syndrome. She is not hypoxic currently but I feel she needs admission. She is comfortable with this plan. We'll give IV fluids, ceftriaxone, azithromycin and continued IV pain medication for pain control. Will discuss with medicine for admission.  Her troponin is negative. She is hemodynamically stable. I do not think this is a pulmonary embolus. Doubt dissection.  ED PROGRESS:   3:31 AM Discussed patient's case with hospitalist, Dr. Myna Hidalgo.  I have recommended admission and patient (and family if present) agree with this plan. Admitting physician will place admission orders.   I reviewed all nursing notes, vitals, pertinent previous records, EKGs, lab and urine results, imaging (as available).    EKG Interpretation  Date/Time:  Monday May 23 2017 03:18:10 EDT Ventricular Rate:  83 PR Interval:    QRS Duration: 97 QT Interval:  388 QTC Calculation: 456 R Axis:   54 Text Interpretation:  Sinus rhythm Confirmed by Pryor Curia [phone]) on 05/23/2017 3:30:40 AM        I personally performed the services described in this documentation, which was scribed in my presence. The recorded information has been reviewed and is accurate.     Burlin Mcnair, Delice Bison, DO 05/23/17 [id number]

## 2017-05-23 NOTE — ED Triage Notes (Signed)
The pt leftg [facility] to come here because they could not get an iv on her there   She had an xray  No labs

## 2017-05-23 NOTE — ED Notes (Addendum)
Pt presents this date with sickle cell pain. Pt states she went to [facility] and was told they couldn't do anything for her. Pt states this pain is not going away.

## 2017-05-23 NOTE — H&P (Signed)
History and Physical    Carmen [name] NFA:[id number] DOB: 1991/10/23 DOA: 05/23/2017  PCP: [name], MD   Patient coming from: Home  Chief Complaint: Diffuse pain   HPI: Carmen [name] is a 26 y.o. female with medical history significant for sickle cell anemia and chronic thrombocytosis, now presenting to the emergency department for evaluation of diffuse severe pain, fevers, chills, and a mild cough. Patient reports that she had been in her usual state until approximately one week ago when she developed diffuse body aches and pain consistent with her typical sickle cell crises. She also developed subjective fevers and chills around that same time. She notes some slight rhinorrhea and a mild cough, but denies sore throat, abdominal pain, nausea, vomiting, or dysuria. She reports some pain in her chest that is consistent with her prior sickle cell crises. Denies any significant dyspnea. Denies sick contacts or recent long distance travel. She was hospitalized from 05/17/2017 until 05/22/2017 for management of sickle cell pain crisis, and left the hospital AMA.   ED Course: Upon arrival to the ED, patient is found to be afebrile, saturating well on room air, and with vital signs stable. EKG features normal sinus rhythm and chest x-ray is notable for subtle hazy density at the left base felt to represent atelectasis, but with early infection not excluded. Chemistry panel reveals a potassium of 3.2 and bilirubin of 3.4. CBC features a new leukocytosis to 13,800 with a stable normocytic anemia and improved chronic thrombocytosis. Blood cultures were obtained in the ED, patient was treated with multiple doses of IV Dilaudid, and she was started on empiric Rocephin and azithromycin. She remained hemodynamically stable in the emergency department and has not been in any acute respiratory distress and will be admitted to the telemetry unit [facility] for ongoing evaluation and management  sickle cell disease with pain crisis and fever, cough, leukocytosis, and chest x-ray opacity, concerning for a pneumonia.  Review of Systems:  All other systems reviewed and apart from HPI, are negative.  Past Medical History:  Diagnosis Date  . Abortion    05/2012  . Headache(784.0)   . Sickle cell crisis [facility])     Past Surgical History:  Procedure Laterality Date  . CHOLECYSTECTOMY N/A 11/30/2014   Procedure: LAPAROSCOPIC CHOLECYSTECTOMY SINGLE SITE WITH INTRAOPERATIVE CHOLANGIOGRAM;  Surgeon: Michael Boston, MD;  Location: [facility] ORS;  Service: General;  Laterality: N/A;  . SPLENECTOMY       reports that she has never smoked. She has never used smokeless tobacco. She reports that she does not drink alcohol or use drugs.  No Known Allergies  Family History  Problem Relation Age of Onset  . Hypertension Mother   . Sickle cell anemia Sister   . Kidney disease Sister        Lupus  . Arthritis Sister   . Sickle cell anemia Sister   . Sickle cell trait Sister   . Heart disease Maternal Aunt        CABG  . Heart disease Maternal Uncle        CABG  . Lupus Sister      Prior to Admission medications   Medication Sig Start Date End Date Taking? Authorizing Provider  levofloxacin (LEVAQUIN) 500 MG tablet Take 1 tablet (500 mg total) by mouth daily. 05/22/17   Virgel Manifold, MD  morphine (MS CONTIN) 60 MG 12 hr tablet Take 60 mg by mouth 2 (two) times daily as needed for pain.  [provider]  oxyCODONE-acetaminophen (PERCOCET) 10-325 MG tablet Take 1 tablet by mouth every 4 (four) hours as needed for severe pain. 05/09/17   [provider]    Physical Exam: Vitals:   05/23/17 0132  BP: 131/90  Pulse: 97  Resp: 18  Temp: 98.7 F (37.1 C)  SpO2: 98%  Weight: 79.4 kg (175 lb)  Height: 5\' 3"  (1.6 m)      Constitutional: No acute distress, calm, appears uncomfortable  Eyes: PERTLA, lids normal. Icteric sclera ENMT: Mucous membranes are moist. Posterior  pharynx clear of any exudate or lesions.   Neck: normal, supple, no masses, no thyromegaly Respiratory: clear to auscultation bilaterally, no wheezing, no crackles. Normal respiratory effort.  Cardiovascular: S1 & S2 heard, regular rate and rhythm. No extremity edema. No significant JVD. Abdomen: No distension, no tenderness, no masses palpated. Bowel sounds normal.  Musculoskeletal: no clubbing / cyanosis. No joint deformity upper and lower extremities.   Skin: no significant rashes, lesions, ulcers. Warm, dry, well-perfused. Neurologic: CN 2-12 grossly intact. Sensation intact, DTR normal. Strength 5/5 in all 4 limbs.  Psychiatric: Alert and oriented x 3. Calm and cooperative.     Labs on Admission: I have personally reviewed following labs and imaging studies  CBC:  Recent Labs Lab 05/17/17 1354 05/18/17 0045 05/20/17 1124 05/23/17 0140  WBC 9.9 10.4 8.8 13.8*  NEUTROABS 5.4 4.7 5.2 9.5*  HGB 10.3* 9.3* 8.6* 9.4*  HCT 28.6* 26.2* 23.7* 26.5*  MCV 81.9 82.1 80.9 81.3  PLT 639* 573* 503* 115*   Basic Metabolic Panel:  Recent Labs Lab 05/17/17 1354 05/18/17 0045 05/20/17 1124 05/23/17 0140  NA 141 139 137 139  K 3.8 3.6 3.6 3.2*  CL 105 105 101 103  CO2 27 27 26 26   GLUCOSE 93 98 114* 96  BUN 10 12 <5* <5*  CREATININE 0.65 0.75 0.40* 0.49  CALCIUM 9.4 9.2 9.3 9.5   GFR: Estimated Creatinine Clearance: 108.2 mL/min (by C-G formula based on SCr of 0.49 mg/dL). Liver Function Tests:  Recent Labs Lab 05/17/17 1354 05/18/17 0045 05/23/17 0140  AST 21 22 21   ALT 16 15 18   ALKPHOS 65 56 59  BILITOT 1.2 1.5* 3.4*  PROT 8.2* 7.5 7.6  ALBUMIN 4.7 4.2 4.0   No results for input(s): LIPASE, AMYLASE in the last 168 hours. No results for input(s): AMMONIA in the last 168 hours. Coagulation Profile: No results for input(s): INR, PROTIME in the last 168 hours. Cardiac Enzymes: No results for input(s): CKTOTAL, CKMB, CKMBINDEX, TROPONINI in the last 168 hours. BNP  (last 3 results) No results for input(s): PROBNP in the last 8760 hours. HbA1C: No results for input(s): HGBA1C in the last 72 hours. CBG: No results for input(s): GLUCAP in the last 168 hours. Lipid Profile: No results for input(s): CHOL, HDL, LDLCALC, TRIG, CHOLHDL, LDLDIRECT in the last 72 hours. Thyroid Function Tests: No results for input(s): TSH, T4TOTAL, FREET4, T3FREE, THYROIDAB in the last 72 hours. Anemia Panel:  Recent Labs  05/20/17 1124 05/23/17 0140  RETICCTPCT 8.4* 10.9*   Urine analysis:    Component Value Date/Time   COLORURINE YELLOW 05/07/2017 1547   APPEARANCEUR CLEAR 05/07/2017 1547   LABSPEC 1.009 05/07/2017 1547   PHURINE 7.0 05/07/2017 1547   GLUCOSEU NEGATIVE 05/07/2017 1547   HGBUR LARGE (A) 05/07/2017 1547   BILIRUBINUR NEGATIVE 05/07/2017 1547   KETONESUR NEGATIVE 05/07/2017 1547   PROTEINUR NEGATIVE 05/07/2017 1547   UROBILINOGEN 1.0 09/12/2015 1548   NITRITE  NEGATIVE 05/07/2017 1547   LEUKOCYTESUR NEGATIVE 05/07/2017 1547   Sepsis Labs: @LABRCNTIP (procalcitonin:4,lacticidven:4) )No results found for this or any previous visit (from the past 240 hour(s)).   Radiological Exams on Admission: Dg Chest 2 View  Result Date: 05/22/2017 CLINICAL DATA:  Chest pain beginning today with weakness. EXAM: CHEST  2 VIEW COMPARISON:  03/28/2017 FINDINGS: Lungs are somewhat hypoinflated with subtle patchy density left base as cannot exclude atelectasis or early infection. No evidence of effusion. Cardiomediastinal silhouette and remainder of the exam is unchanged. IMPRESSION: Subtle patchy hazy density in the left base at likely atelectasis, although cannot exclude early infection. Electronically Signed   By: Marin Olp M.D.   On: 05/22/2017 14:43    EKG: Independently reviewed. Normal sinus rhythm.   Assessment/Plan  1. Sickle cell pain crisis - Pt presents with diffuse pain that she describes as being consistent with her typical pain crisis  - She  had just left the hospital AMA yesterday after admission for the same  - Pain persists largely unchanged despite multiple doses of IV Dilaudid 2 mg in ED  - Plan to admit with Dilaudid PCA, IV hydration, continued long-acting morphine, and Toradol - There is a subtle opacity on chest x-ray, possibly reflecting a mild acute chest; abx and incentive spirometry as below    2. ?PNA   - Subtle opacity noted on CXR and fever documented yesterday, new leukocytosis today  - Reports some rhinorrhea, suggesting a possible viral etiology; denies urinary sxs  - Blood cultures collected in ED and empiric doses of Rocephin and azithromycin given  - Check sputum culture and strep pneumo antigen  - Given recent hospitalization will continue empiric treatment with vancomycin and cefepime; continue azithromycin for atypicals  - IVF hydration with D5%-1/2 NS, supplemental O2 as needed   3. Thrombocytosis  - Chronic, improved from priors    4. Hypokalemia  - Serum potassium is 3.2 on admission, possibly from recent IVF hydration with D5-1/2NS - KCl added to IVF, monitoring on telemetry, repeat chem panel     DVT prophylaxis: sq Lovenox Code Status: Full  Family Communication: Discussed with patient Disposition Plan: Admit to telemetry at [place] called: None Admission status: Inpatient    Vianne Bulls, MD [facility] Pager [phone]  If 7PM-7AM, please contact night-coverage [email or web address] Password [facility]  05/23/2017, 3:36 AM

## 2017-05-23 NOTE — Progress Notes (Signed)
SICKLE CELL SERVICE PROGRESS NOTE  Carmen [name] ZCH:[id number] DOB: February 22, 1991 DOA: 05/23/2017 PCP: [name], MD  Assessment/Plan: Principal Problem:   Sickle cell pain crisis ([facility]) Active Problems:   Leukocytosis   Thrombocytosis (HCC)   Hypokalemia   Fever and chills  1. Hb SS with crisis: Continue PCA at adjusted dose, continue Toradol and IVF.  2. Abnormal Finding on CXR:; Pt does not have a compatible constellation of symptoms but she is resistant to my discontinuing antibiotics altogether. Will discontinue Vancomycin and Azithromycin. Continue Cefepime and if cultures negative in 48 hours and no clinical symptoms to support pneumonia. Will discontinue Cefepime also. Pt agreeable.  3. Thrombocytosis: pt has a chronically elevated Platelet count and has been refusing ASA. However today she is willing to accept ASA. Will start ASA 81 mg daily.  4. Chronic pain: Continue: Continue MS Contin  Code Status: Full Code Family Communication: N/A Disposition Plan: Not yet ready for discharge  [place].  Pager [phone]. If 7PM-7AM, please contact night-coverage.  05/23/2017, 10:35 AM  LOS: 0 days   Interim History: Pt reports pain as 10/10 and localized to arms, legs and back. She has used 19.2 mg with 19/19:demnads/delivereis since admission to the ward.   Pt was hospitalized for Sickle Cel Crisis but left the hospital AMA yesterday.   Objective: Vitals:   05/23/17 0449 05/23/17 0536 05/23/17 0545 05/23/17 0748  BP: 129/81 124/80    Pulse: 75 89    Resp: 20 20 (!) 23 15  Temp: 98.7 F (37.1 C) 99.1 F (37.3 C)    TempSrc:  Oral    SpO2: 100% 99% 96% 99%  Weight:  76.9 kg (169 lb 8.5 oz)    Height:  5\' 3"  (1.6 m)     Weight change:   Intake/Output Summary (Last 24 hours) at 05/23/17 1035 Last data filed at 05/23/17 7209  Gross per 24 hour  Intake              250 ml  Output                0 ml  Net              250 ml      Physical  Exam General: Alert, awake, oriented x3, in no apparent distress.  HEENT: [place]/AT PEERL, EOMI, anicteric Neck: Trachea midline,  no masses, no thyromegal,y no JVD, no carotid bruit OROPHARYNX:  Moist, No exudate/ erythema/lesions.  Heart: Regular rate and rhythm, without murmurs, rubs, gallops, PMI non-displaced, no heaves or thrills on palpation.  Lungs: Clear to auscultation, no wheezing or rhonchi noted. No increased vocal fremitus resonant to percussion  Abdomen: Soft, nontender, nondistended, positive bowel sounds, no masses no hepatosplenomegaly noted.  Neuro: No focal neurological deficits noted cranial nerves II through XII grossly intact. Strength at baseline in bilateral upper and lower extremities. Musculoskeletal: No warmth swelling or erythema around joints, no spinal tenderness noted. Psychiatric: Patient alert and oriented x3, good insight and cognition, good recent to remote recall.    Data Reviewed: Basic Metabolic Panel:  Recent Labs Lab 05/17/17 1354 05/18/17 0045 05/20/17 1124 05/23/17 0140  NA 141 139 137 139  K 3.8 3.6 3.6 3.2*  CL 105 105 101 103  CO2 27 27 26 26   GLUCOSE 93 98 114* 96  BUN 10 12 <5* <5*  CREATININE 0.65 0.75 0.40* 0.49  CALCIUM 9.4 9.2 9.3 9.5   Liver Function Tests:  Recent Labs Lab 05/17/17 1354  05/18/17 0045 05/23/17 0140  AST 21 22 21   ALT 16 15 18   ALKPHOS 65 56 59  BILITOT 1.2 1.5* 3.4*  PROT 8.2* 7.5 7.6  ALBUMIN 4.7 4.2 4.0   No results for input(s): LIPASE, AMYLASE in the last 168 hours. No results for input(s): AMMONIA in the last 168 hours. CBC:  Recent Labs Lab 05/17/17 1354 05/18/17 0045 05/20/17 1124 05/23/17 0140  WBC 9.9 10.4 8.8 13.8*  NEUTROABS 5.4 4.7 5.2 9.5*  HGB 10.3* 9.3* 8.6* 9.4*  HCT 28.6* 26.2* 23.7* 26.5*  MCV 81.9 82.1 80.9 81.3  PLT 639* 573* 503* 501*   Cardiac Enzymes:  Recent Labs Lab 05/23/17 0409  TROPONINI <0.03   BNP (last 3 results) No results for input(s): BNP in the  last 8760 hours.  ProBNP (last 3 results) No results for input(s): PROBNP in the last 8760 hours.  CBG: No results for input(s): GLUCAP in the last 168 hours.  No results found for this or any previous visit (from the past 240 hour(s)).   Studies: Dg Chest 2 View  Result Date: 05/22/2017 CLINICAL DATA:  Chest pain beginning today with weakness. EXAM: CHEST  2 VIEW COMPARISON:  03/28/2017 FINDINGS: Lungs are somewhat hypoinflated with subtle patchy density left base as cannot exclude atelectasis or early infection. No evidence of effusion. Cardiomediastinal silhouette and remainder of the exam is unchanged. IMPRESSION: Subtle patchy hazy density in the left base at likely atelectasis, although cannot exclude early infection. Electronically Signed   By: Marin Olp M.D.   On: 05/22/2017 14:43    Scheduled Meds: . enoxaparin (LOVENOX) injection  40 mg Subcutaneous Q24H  . HYDROmorphone   Intravenous Q4H  . ketorolac  15 mg Intravenous Q6H  . morphine  60 mg Oral Q12H  . senna-docusate  1 tablet Oral BID   Continuous Infusions: . ceFEPime (MAXIPIME) IV Stopped (05/23/17 0708)  . dextrose 5 % and 0.45 % NaCl with KCl 20 mEq/L 100 mL/hr at 05/23/17 3846  . diphenhydrAMINE (BENADRYL) IVPB(SICKLE CELL ONLY)      Principal Problem:   Sickle cell pain crisis (HCC) Active Problems:   Leukocytosis   Thrombocytosis (HCC)   Hypokalemia   Fever and chills    In excess of 25 minutes spent during this visit. Greater than 50% involved face to face contact with the patient for assessment, counseling and coordination of care.

## 2017-05-23 NOTE — Progress Notes (Signed)
Pharmacy Antibiotic Note  Carmen [name] is a 26 y.o. female with PMH of sickle cell admitted on 05/23/2017 with pneumonia.  Pharmacy has been consulted for vancomycin dosing.  Plan: Vancomycin 1000mg  IV every 8 hours.  Goal trough 15-20 mcg/mL.  F/U clinical progression and LOT  Height: 5\' 3"  (160 cm) Weight: 175 lb (79.4 kg) IBW/kg (Calculated) : 52.4  Temp (24hrs), Avg:99 F (37.2 C), Min:98.3 F (36.8 C), Max:100.6 F (38.1 C)   Recent Labs Lab 05/17/17 1354 05/18/17 0045 05/20/17 1124 05/23/17 0140 05/23/17 0340  WBC 9.9 10.4 8.8 13.8*  --   CREATININE 0.65 0.75 0.40* 0.49  --   LATICACIDVEN  --   --   --   --  0.51    Estimated Creatinine Clearance: 108.2 mL/min (by C-G formula based on SCr of 0.49 mg/dL).    No Known Allergies  Antimicrobials this admission: 7/23 vancomycin>> 7/23 cefepime>> 7/23 azithromycin>>   Dose adjustments this admission:  Microbiology results: 7/23 BCx:   Thank you for allowing pharmacy to be a part of this patient's care.  Jodean Lima Reneta Niehaus 05/23/2017 3:59 AM

## 2017-05-23 NOTE — ED Notes (Signed)
Nurse currently drawing labs 

## 2017-05-23 NOTE — ED Triage Notes (Signed)
The pt has sickle cell and she is hurting all over for one week  She last had percocet  2230

## 2017-05-24 DIAGNOSIS — D5700 Hb-SS disease with crisis, unspecified: Principal | ICD-10-CM

## 2017-05-24 DIAGNOSIS — D473 Essential (hemorrhagic) thrombocythemia: Secondary | ICD-10-CM

## 2017-05-24 DIAGNOSIS — R918 Other nonspecific abnormal finding of lung field: Secondary | ICD-10-CM

## 2017-05-24 DIAGNOSIS — G894 Chronic pain syndrome: Secondary | ICD-10-CM

## 2017-05-24 NOTE — Progress Notes (Signed)
SICKLE CELL SERVICE PROGRESS NOTE  Carmen [name] CHE:[id number] DOB: 1991-01-14 DOA: 05/23/2017 PCP: [name], MD  Assessment/Plan: Principal Problem:   Sickle cell pain crisis ([place]) Active Problems:   Leukocytosis   Thrombocytosis (HCC)   Hypokalemia   Fever and chills  1. Hb SS with crisis: Continue Continue PCA and Toradol at current dose. Decrease IVF to [facility] as patient tolerating meals well.  2. Abnormal Finding on CXR:; Pt has had no cough, fever or SOB since admission.However she is IV antibiotics. She has not been able to produce a proper sputum as all samples so far has just been saliva. Will d/c antibiotics and observe without antibiotics.  3. Thrombocytosis: Continue ASA 81 mg. 4. Chronic pain: Continue: Continue MS Contin  Code Status: Full Code Family Communication: N/A Disposition Plan: Not yet ready for discharge  [place].  Pager [phone]. If 7PM-7AM, please contact night-coverage.  05/24/2017, 11:50 AM  LOS: 1 day   Interim History: Pt reports pain as 8-9/10 and localized to arms, legs and back. She has used 33 mg with 60/55:demnads/delivereis in the last 24 hours.   Pt was hospitalized for Sickle Cel Crisis but left the hospital AMA 2 days ago.  Objective: Vitals:   05/24/17 0535 05/24/17 0735  BP: 108/79   Pulse: 78   Resp: 15 17  Temp: 97.8 F (36.6 C)   TempSrc: Oral   SpO2: 97% 97%  Weight:    Height:     Weight change:   Intake/Output Summary (Last 24 hours) at 05/24/17 0949 Last data filed at 05/24/17 0532  Gross per 24 hour  Intake             1865 ml  Output              900 ml  Net              965 ml      Physical Exam General: Alert, awake, oriented x3, in no apparent distress.  HEENT: [place]/AT PEERL, EOMI, anicteric Neck: Trachea midline,  no masses, no thyromegal,y no JVD, no carotid bruit OROPHARYNX:  Moist, No exudate/ erythema/lesions.  Heart: Regular rate and rhythm, without murmurs, rubs, gallops, PMI  non-displaced, no heaves or thrills on palpation.  Lungs: Clear to auscultation, no wheezing or rhonchi noted. No increased vocal fremitus resonant to percussion  Abdomen: Soft, nontender, nondistended, positive bowel sounds, no masses no hepatosplenomegaly noted.  Neuro: No focal neurological deficits noted cranial nerves II through XII grossly intact. Strength at baseline in bilateral upper and lower extremities. Musculoskeletal: No warmth swelling or erythema around joints, no spinal tenderness noted. Psychiatric: Patient alert and oriented x3, good insight and cognition, good recent to remote recall.    Data Reviewed: Basic Metabolic Panel:  Recent Labs Lab 05/17/17 1354 05/18/17 0045 05/20/17 1124 05/23/17 0140  NA 141 139 137 139  K 3.8 3.6 3.6 3.2*  CL 105 105 101 103  CO2 27 27 26 26   GLUCOSE 93 98 114* 96  BUN 10 12 <5* <5*  CREATININE 0.65 0.75 0.40* 0.49  CALCIUM 9.4 9.2 9.3 9.5   Liver Function Tests:  Recent Labs Lab 05/17/17 1354 05/18/17 0045 05/23/17 0140  AST 21 22 21   ALT 16 15 18   ALKPHOS 65 56 59  BILITOT 1.2 1.5* 3.4*  PROT 8.2* 7.5 7.6  ALBUMIN 4.7 4.2 4.0   No results for input(s): LIPASE, AMYLASE in the last 168 hours. No results for input(s): AMMONIA in the  last 168 hours. CBC:  Recent Labs Lab 05/17/17 1354 05/18/17 0045 05/20/17 1124 05/23/17 0140  WBC 9.9 10.4 8.8 13.8*  NEUTROABS 5.4 4.7 5.2 9.5*  HGB 10.3* 9.3* 8.6* 9.4*  HCT 28.6* 26.2* 23.7* 26.5*  MCV 81.9 82.1 80.9 81.3  PLT 639* 573* 503* 501*   Cardiac Enzymes:  Recent Labs Lab 05/23/17 0409  TROPONINI <0.03   BNP (last 3 results) No results for input(s): BNP in the last 8760 hours.  ProBNP (last 3 results) No results for input(s): PROBNP in the last 8760 hours.  CBG: No results for input(s): GLUCAP in the last 168 hours.  No results found for this or any previous visit (from the past 240 hour(s)).   Studies: Dg Chest 2 View  Result Date:  05/22/2017 CLINICAL DATA:  Chest pain beginning today with weakness. EXAM: CHEST  2 VIEW COMPARISON:  03/28/2017 FINDINGS: Lungs are somewhat hypoinflated with subtle patchy density left base as cannot exclude atelectasis or early infection. No evidence of effusion. Cardiomediastinal silhouette and remainder of the exam is unchanged. IMPRESSION: Subtle patchy hazy density in the left base at likely atelectasis, although cannot exclude early infection. Electronically Signed   By: Marin Olp M.D.   On: 05/22/2017 14:43    Scheduled Meds: . aspirin  81 mg Oral Daily  . enoxaparin (LOVENOX) injection  40 mg Subcutaneous Q24H  . HYDROmorphone   Intravenous Q4H  . ketorolac  15 mg Intravenous Q6H  . morphine  60 mg Oral Q12H  . senna-docusate  1 tablet Oral BID   Continuous Infusions: . ceFEPime (MAXIPIME) IV Stopped (05/24/17 0602)  . diphenhydrAMINE (BENADRYL) IVPB(SICKLE CELL ONLY)      Principal Problem:   Sickle cell pain crisis (HCC) Active Problems:   Leukocytosis   Thrombocytosis (HCC)   Hypokalemia   Fever and chills    In excess of 25 minutes spent during this visit. Greater than 50% involved face to face contact with the patient for assessment, counseling and coordination of care.

## 2017-05-24 NOTE — Care Management Note (Signed)
Case Management Note  Patient Details  Name: Carmen [name] MRN: [id number] Date of Birth: 03-09-1991  Subjective/Objective:  26 y/o f admitted w/SSC. Readmit. From home.                  Action/Plan:d/c plan home.   Expected Discharge Date:                  Expected Discharge Plan:  Home/Self Care  In-House Referral:     Discharge planning Services  CM Consult  Post Acute Care Choice:    Choice offered to:     DME Arranged:    DME Agency:     HH Arranged:    HH Agency:     Status of Service:  In process, will continue to follow  If discussed at Long Length of Stay Meetings, dates discussed:    Additional Comments:  Dessa Phi, RN 05/24/2017, 12:41 PM

## 2017-05-24 NOTE — Progress Notes (Signed)
Wasted 28mL of dilaudid with E. Pellitizer, that was left over from PCA.  Barbee Shropshire. Brigitte Pulse, RN

## 2017-05-24 NOTE — Progress Notes (Signed)
Patient signed AMA form, educated about AMA procedure. MD - Dr. Zigmund Daniel aware. Carmen [name]. Brigitte Pulse, RN

## 2017-05-24 NOTE — Progress Notes (Signed)
Patient ID: Carmen [name], female   DOB: Apr 06, 1991, 26 y.o.   MRN: [id number] Nurse called stating that patient states that she has family emergency and wants to leave AMA. She is currently receiving IV antibiotics and last available vital signs from 0501 this morning. Will go to bedside to see patient.   Bisma Klett A.

## 2017-05-28 ENCOUNTER — Emergency Department (HOSPITAL_COMMUNITY): Payer: Medicaid Other

## 2017-05-28 ENCOUNTER — Encounter (HOSPITAL_COMMUNITY): Payer: Self-pay

## 2017-05-28 ENCOUNTER — Inpatient Hospital Stay (HOSPITAL_COMMUNITY)
Admission: EM | Admit: 2017-05-28 | Discharge: 2017-05-31 | DRG: 812 | Disposition: A | Discharge status: Home / Self Care | Payer: Medicaid Other | Attending: Internal Medicine | Admitting: Internal Medicine

## 2017-05-28 ENCOUNTER — Emergency Department (HOSPITAL_COMMUNITY)
Admission: EM | Admit: 2017-05-28 | Discharge: 2017-05-28 | Disposition: A | Discharge status: Home / Self Care | Payer: Medicaid Other | Source: Home / Self Care | Attending: Emergency Medicine | Admitting: Emergency Medicine

## 2017-05-28 ENCOUNTER — Encounter (HOSPITAL_COMMUNITY): Payer: Self-pay | Admitting: Emergency Medicine

## 2017-05-28 DIAGNOSIS — F6089 Other specific personality disorders: Secondary | ICD-10-CM | POA: Diagnosis present

## 2017-05-28 DIAGNOSIS — D473 Essential (hemorrhagic) thrombocythemia: Secondary | ICD-10-CM | POA: Diagnosis present

## 2017-05-28 DIAGNOSIS — D75839 Thrombocytosis, unspecified: Secondary | ICD-10-CM | POA: Diagnosis present

## 2017-05-28 DIAGNOSIS — G894 Chronic pain syndrome: Secondary | ICD-10-CM | POA: Diagnosis present

## 2017-05-28 DIAGNOSIS — Z832 Family history of diseases of the blood and blood-forming organs and certain disorders involving the immune mechanism: Secondary | ICD-10-CM

## 2017-05-28 DIAGNOSIS — D5700 Hb-SS disease with crisis, unspecified: Secondary | ICD-10-CM

## 2017-05-28 DIAGNOSIS — K5900 Constipation, unspecified: Secondary | ICD-10-CM | POA: Diagnosis present

## 2017-05-28 DIAGNOSIS — D638 Anemia in other chronic diseases classified elsewhere: Secondary | ICD-10-CM | POA: Diagnosis present

## 2017-05-28 LAB — CBC WITH DIFFERENTIAL/PLATELET
Basophils Absolute: 0.1 10*3/uL (ref 0.0–0.1)
Basophils Relative: 1 %
Eosinophils Absolute: 0 10*3/uL (ref 0.0–0.7)
Eosinophils Relative: 1 %
HCT: 26.8 % — ABNORMAL LOW (ref 36.0–46.0)
Hemoglobin: 9.6 g/dL — ABNORMAL LOW (ref 12.0–15.0)
Lymphocytes Relative: 37 %
Lymphs Abs: 2.3 10*3/uL (ref 0.7–4.0)
MCH: 29 pg (ref 26.0–34.0)
MCHC: 35.8 g/dL (ref 30.0–36.0)
MCV: 81 fL (ref 78.0–100.0)
Monocytes Absolute: 1.1 10*3/uL — ABNORMAL HIGH (ref 0.1–1.0)
Monocytes Relative: 18 %
Neutro Abs: 2.7 10*3/uL (ref 1.7–7.7)
Neutrophils Relative %: 43 %
Platelets: 642 10*3/uL — ABNORMAL HIGH (ref 150–400)
RBC: 3.31 MIL/uL — ABNORMAL LOW (ref 3.87–5.11)
RDW: 15.4 % (ref 11.5–15.5)
WBC: 6.2 10*3/uL (ref 4.0–10.5)

## 2017-05-28 LAB — COMPREHENSIVE METABOLIC PANEL
ALT: 34 U/L (ref 14–54)
AST: 26 U/L (ref 15–41)
Albumin: 4.2 g/dL (ref 3.5–5.0)
Alkaline Phosphatase: 61 U/L (ref 38–126)
Anion gap: 7 (ref 5–15)
BUN: 6 mg/dL (ref 6–20)
CO2: 25 mmol/L (ref 22–32)
Calcium: 9.6 mg/dL (ref 8.9–10.3)
Chloride: 109 mmol/L (ref 101–111)
Creatinine, Ser: 0.47 mg/dL (ref 0.44–1.00)
GFR calc Af Amer: >60 mL/min (ref >60)
GFR calc non Af Amer: >60 mL/min (ref >60)
Glucose, Bld: 88 mg/dL (ref 65–99)
Potassium: 3.6 mmol/L (ref 3.5–5.1)
Sodium: 141 mmol/L (ref 135–145)
Total Bilirubin: 1.7 mg/dL — ABNORMAL HIGH (ref 0.3–1.2)
Total Protein: 8.4 g/dL — ABNORMAL HIGH (ref 6.5–8.1)

## 2017-05-28 LAB — RETICULOCYTES
RBC.: 3.31 MIL/uL — ABNORMAL LOW (ref 3.87–5.11)
Retic Count, Absolute: 238.3 10*3/uL — ABNORMAL HIGH (ref 19.0–186.0)
Retic Ct Pct: 7.2 % — ABNORMAL HIGH (ref 0.4–3.1)

## 2017-05-28 LAB — CULTURE, BLOOD (ROUTINE X 2)
CULTURE: NO GROWTH
CULTURE: NO GROWTH
SPECIAL REQUESTS: ADEQUATE
Special Requests: ADEQUATE

## 2017-05-28 MED ORDER — HYDROMORPHONE HCL 1 MG/ML IJ SOLN: 1.0000 mg | INTRAMUSCULAR | Status: AC

## 2017-05-28 MED ORDER — HYDROMORPHONE HCL 1 MG/ML IJ SOLN
0.5000 mg | INTRAMUSCULAR | Status: AC
  Administered 2017-05-29: 0.5 mg via INTRAVENOUS
  Filled 2017-05-28: qty 1

## 2017-05-28 MED ORDER — SODIUM CHLORIDE 0.45 % IV SOLN
INTRAVENOUS | Status: DC
  Administered 2017-05-29 – 2017-05-31 (×5): via INTRAVENOUS

## 2017-05-28 MED ORDER — HYDROMORPHONE HCL 1 MG/ML IJ SOLN: 0.5000 mg | INTRAMUSCULAR | Status: AC

## 2017-05-28 MED ORDER — HYDROMORPHONE HCL 1 MG/ML IJ SOLN
2.0000 mg | INTRAMUSCULAR | Status: AC
  Administered 2017-05-28: 2 mg via INTRAVENOUS
  Filled 2017-05-28: qty 2

## 2017-05-28 MED ORDER — HYDROMORPHONE HCL 1 MG/ML IJ SOLN
1.0000 mg | INTRAMUSCULAR | Status: AC
  Administered 2017-05-29: 1 mg via INTRAVENOUS
  Filled 2017-05-28: qty 1

## 2017-05-28 MED ORDER — HYDROMORPHONE HCL 1 MG/ML IJ SOLN: 2.0000 mg | INTRAMUSCULAR | Status: AC

## 2017-05-28 MED ORDER — ONDANSETRON HCL 4 MG/2ML IJ SOLN: 4.0000 mg | INTRAMUSCULAR | Status: DC | PRN

## 2017-05-28 MED ORDER — DIPHENHYDRAMINE HCL 50 MG/ML IJ SOLN
25.0000 mg | Freq: Once | INTRAMUSCULAR | Status: AC
  Administered 2017-05-28: 25 mg via INTRAVENOUS
  Filled 2017-05-28: qty 1

## 2017-05-28 MED ORDER — HYDROMORPHONE HCL 1 MG/ML IJ SOLN
1.0000 mg | INTRAMUSCULAR | Status: DC
Start: 2017-05-29 — End: 2017-05-29

## 2017-05-28 MED ORDER — KETOROLAC TROMETHAMINE 30 MG/ML IJ SOLN
30.0000 mg | INTRAMUSCULAR | Status: AC
  Administered 2017-05-29: 30 mg via INTRAVENOUS
  Filled 2017-05-28: qty 1

## 2017-05-28 MED ORDER — HYDROMORPHONE HCL 1 MG/ML IJ SOLN: 2.0000 mg | INTRAMUSCULAR | Status: DC

## 2017-05-28 MED ORDER — HYDROMORPHONE HCL 1 MG/ML IJ SOLN
0.5000 mg | Freq: Once | INTRAMUSCULAR | Status: AC
  Administered 2017-05-28: 0.5 mg via SUBCUTANEOUS
  Filled 2017-05-28: qty 1

## 2017-05-28 NOTE — ED Provider Notes (Signed)
[place] DEPT Provider Note   CSN: [id number] Arrival date & time: 05/28/17  2256     History   Chief Complaint Chief Complaint  Patient presents with  . Sickle Cell Pain Crisis    HPI CATTALEYA WIEN is a 26 y.o. female with a hx of sickle cell anemia presents to the Emergency Department complaining of gradual, persistent, progressively worsening back and arm pain onset 2-3 days ago.  Pt reports she was evaluated here earlier today and felt well enough to go home, but after waking from a nap around 5:30 her pain had returned.  She reports taking 1 tab of percocet 10-325 (her usual SSC medication) without relief.  Associated symptoms include mild chest pain without SOB, dizziness, nausea or weakness.  Nothing makes it better and nothing makes it worse.  Pt denies fever, chills, headache, neck pain, abd pain, N/V/D, weakness, dizziness, syncope.       The history is provided by the patient and medical records. No language interpreter was used.    Past Medical History:  Diagnosis Date  . Abortion    05/2012  . Headache(784.0)   . Sickle cell crisis [facility])     Patient Active Problem List   Diagnosis Date Noted  . Fever and chills 05/23/2017  . Lump   . Hb-SS disease with crisis ([place]) 04/27/2017  . Sickle cell pain crisis ([place]) 03/19/2017  . Sickle cell anemia ([place]) 08/15/2016  . Breast lump 08/15/2016  . Anemia of chronic disease   . HCAP (healthcare-associated pneumonia) 02/28/2015  . 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 S/P laparoscopic cholecystectomy 11/30/2014  . Hypokalemia 11/28/2014  . Leukocytosis 11/22/2014  . Thrombocytosis ([place]) 11/22/2014    Past Surgical History:  Procedure Laterality Date  . CHOLECYSTECTOMY N/A 11/30/2014   Procedure: LAPAROSCOPIC CHOLECYSTECTOMY SINGLE SITE WITH INTRAOPERATIVE CHOLANGIOGRAM;  Surgeon: Michael Boston, MD;  Location: [facility];  Service: General;  Laterality: N/A;  . SPLENECTOMY      OB  History    Gravida Para Term Preterm AB Living   1       1     SAB TAB Ectopic Multiple Live Births                   Home Medications    Prior to Admission medications   Medication Sig Start Date End Date Taking? Authorizing Provider  levofloxacin (LEVAQUIN) 500 MG tablet Take 1 tablet (500 mg total) by mouth daily. Patient not taking: Reported on 05/23/2017 05/22/17   Virgel Manifold, MD  morphine (MS CONTIN) 60 MG 12 hr tablet Take 60 mg by mouth 2 (two) times daily as needed for pain.     [provider]  oxyCODONE-acetaminophen (PERCOCET) 10-325 MG tablet Take 1 tablet by mouth every 4 (four) hours as needed for severe pain. 05/09/17   [provider]    Family History Family History  Problem Relation Age of Onset  . Hypertension Mother   . Sickle cell anemia Sister   . Kidney disease Sister        Lupus  . Arthritis Sister   . Sickle cell anemia Sister   . Sickle cell trait Sister   . Heart disease Maternal Aunt        CABG  . Heart disease Maternal Uncle        CABG  . Lupus Sister     Social History Social History  Substance Use Topics  . Smoking  status: Never Smoker  . Smokeless tobacco: Never Used  . Alcohol use No     Allergies   Patient has no known allergies.   Review of Systems Review of Systems  Constitutional: Negative for appetite change, diaphoresis, fatigue, fever and unexpected weight change.  HENT: Negative for mouth sores.   Eyes: Negative for visual disturbance.  Respiratory: Negative for cough, chest tightness, shortness of breath and wheezing.   Cardiovascular: Positive for chest pain.  Gastrointestinal: Negative for abdominal pain, constipation, diarrhea, nausea and vomiting.  Endocrine: Negative for polydipsia, polyphagia and polyuria.  Genitourinary: Negative for dysuria, frequency, hematuria and urgency.  Musculoskeletal: Positive for arthralgias and back pain. Negative for neck stiffness.  Skin: Negative for rash.    Allergic/Immunologic: Negative for immunocompromised state.  Neurological: Negative for syncope, light-headedness and headaches.  Hematological: Does not bruise/bleed easily.  Psychiatric/Behavioral: Negative for sleep disturbance. The patient is not nervous/anxious.   All other systems reviewed and are negative.    Physical Exam Updated Vital Signs BP 102/88 (BP Location: Left Arm)   Pulse 92   Temp 98.8 F (37.1 C) (Oral)   Resp 16   LMP 05/05/2017   SpO2 100%   Physical Exam  Constitutional: She is oriented to person, place, and time. She appears well-developed and well-nourished. No distress.  Awake, alert, nontoxic appearing, NAD  HENT:  Head: Normocephalic and atraumatic.  Mouth/Throat: Oropharynx is clear and moist.  Eyes: Conjunctivae are normal. No scleral icterus.  Neck: Normal range of motion. Neck supple.  Cardiovascular: Normal rate, regular rhythm and intact distal pulses.   Pulses:      Radial pulses are 2+ on the right side, and 2+ on the left side.       Dorsalis pedis pulses are 2+ on the right side, and 2+ on the left side.       Posterior tibial pulses are 2+ on the right side, and 2+ on the left side.  Capillary refill < 3 sec  Pulmonary/Chest: Effort normal and breath sounds normal. No accessory muscle usage. No tachypnea. No respiratory distress. She has no decreased breath sounds. She has no wheezes. She has no rhonchi. She has no rales. She exhibits no bony tenderness.  Abdominal: Soft. Bowel sounds are normal. She exhibits no distension. There is no tenderness. There is no guarding and no CVA tenderness.  Musculoskeletal: Normal range of motion. She exhibits no tenderness.  Full ROM of all major joints No erythema, tenderness or swelling of any major joint  Lymphadenopathy:    She has no cervical adenopathy.  Neurological: She is alert and oriented to person, place, and time.  Skin: Skin is warm and dry. She is not diaphoretic. No erythema.   Psychiatric: She has a normal mood and affect. Her behavior is normal.  Nursing note and vitals reviewed.    ED Treatments / Results  Labs (all labs ordered are listed, but only abnormal results are displayed) Labs Reviewed  RETICULOCYTES - Abnormal; Notable for the following:       Result Value   Retic Ct Pct 7.3 (*)    RBC. 3.37 (*)    Retic Count, Absolute 246.0 (*)    All other components within normal limits  CBC - Abnormal; Notable for the following:    RBC 3.37 (*)    Hemoglobin 10.0 (*)    HCT 27.6 (*)    MCHC 36.2 (*)    RDW 15.6 (*)    Platelets 564 (*)  All other components within normal limits  BASIC METABOLIC PANEL  I-STAT TROPONIN, ED  I-STAT BETA HCG BLOOD, ED ([facility], [facility], [facility] ONLY)    EKG  EKG Interpretation None       Radiology Dg Chest 2 View  Result Date: 05/29/2017 CLINICAL DATA:  Left-sided chest pain.  Sickle cell disease. EXAM: CHEST  2 VIEW COMPARISON:  05/22/2017 FINDINGS: The lungs are clear. The pulmonary vasculature is normal. Heart size is normal. Hilar and mediastinal contours are unremarkable. There is no pleural effusion. IMPRESSION: No active cardiopulmonary disease. Electronically Signed   By: Andreas Newport M.D.   On: 05/29/2017 00:02    Procedures Procedures (including critical care time)  Medications Ordered in ED Medications  0.45 % sodium chloride infusion ( Intravenous New Bag/Given 05/29/17 0103)  HYDROmorphone (DILAUDID) injection 1 mg (not administered)    Or  HYDROmorphone (DILAUDID) injection 1 mg (not administered)  HYDROmorphone (DILAUDID) injection 1 mg (not administered)    Or  HYDROmorphone (DILAUDID) injection 1 mg (not administered)  HYDROmorphone (DILAUDID) injection 1 mg (not administered)    Or  HYDROmorphone (DILAUDID) injection 1 mg (not administered)  ketorolac (TORADOL) 30 MG/ML injection 30 mg (30 mg Intravenous Given 05/29/17 0102)  HYDROmorphone (DILAUDID) injection 0.5 mg (0.5 mg Intravenous Given  05/29/17 0102)    Or  HYDROmorphone (DILAUDID) injection 0.5 mg ( Subcutaneous See Alternative 05/29/17 0102)     Initial Impression / Assessment and Plan / ED Course  I have reviewed the triage vital signs and the nursing notes.  Pertinent labs & imaging results that were available during my care of the patient were reviewed by me and considered in my medical decision making (see chart for details).     Patient presents with sickle cell pain crisis. She was evaluated earlier today in the emergency department, felt better after medications and was discharged home. She reports that her pain is again uncontrollable. Labs earlier today were reassuring. Labs here in the department are again reassuring.  EKG unchanged from previous and without ischemia. Chest x-ray without evidence of acute chest syndrome. Troponin negative.  Discussed with triad hospitalist who will admit.  Final Clinical Impressions(s) / ED Diagnoses   Final diagnoses:  Sickle cell pain crisis [facility])    New Prescriptions New Prescriptions   No medications on file     Agapito Games 38/17/71 1657    Delora Fuel, MD 90/38/33 [phone]

## 2017-05-28 NOTE — Discharge Instructions (Signed)
Get help right away if: °You feel dizzy or faint. °You have new abdominal pain, especially on the left side near the stomach area. °You develop a persistent, often uncomfortable and painful penile erection (priapism). If this is not treated immediately it will lead to impotence. °You have numbness your arms or legs or you have a hard time moving them. °You have a hard time with speech. °You have a fever or persistent symptoms for more than 2-3 days. °You have a fever and your symptoms suddenly get worse. °You have signs or symptoms of infection. These include: °Chills. °Abnormal tiredness (lethargy). °Irritability. °Poor eating. °Vomiting. °You develop pain that is not helped with medicine. °You develop shortness of breath. °You have pain in your chest. °You are coughing up pus-like or bloody sputum. °You develop a stiff neck. °Your feet or hands swell or have pain. °Your abdomen appears bloated. °You develop joint pain. °

## 2017-05-28 NOTE — ED Triage Notes (Signed)
Patient here with complaints of increased sickle cell pain. Pain mostly in back and bilateral arms. Denies chest pain/ SOB.

## 2017-05-28 NOTE — ED Provider Notes (Signed)
[place] DEPT Provider Note   CSN: [id number] Arrival date & time: 05/28/17  1117     History   Chief Complaint Chief Complaint  Patient presents with  . Sickle Cell Pain Crisis    HPI Carmen [name] is a 26 y.o. female.He presents emergency Department with chief complaint of sickle cell pain crisis. Patient states that she was seen at [facility] for sickle cell pain crisis, where she was treated with ketamine. The patient states that this did not help her pain and that she felt "like a zombie" taking the ketamine. Patient states that she still had significant pain regardless of that medication. She is followed by Dr. Laren Everts. She takes Percocet nearly every day, however, has extended release morphine which she uses at home when she has a sickle cell pain crisis and her regular medications have failed. Patient states that she is out of that medication and has an appointment in 2 days with her primary care doctor for follow-up. She states that she is having pain in her lower back and her arms. She states that this is where she normally has her pain exacerbations. She denies fevers, urinary symptoms, neurologic symptoms, chest pain or shortness of breath.  HPI  Past Medical History:  Diagnosis Date  . Abortion    05/2012  . Headache(784.0)   . Sickle cell crisis [facility])     Patient Active Problem List   Diagnosis Date Noted  . Fever and chills 05/23/2017  . Lump   . Hb-SS disease with crisis ([place]) 04/27/2017  . Sickle cell pain crisis ([place]) 03/19/2017  . Sickle cell anemia ([place]) 08/15/2016  . Breast lump 08/15/2016  . Anemia of chronic disease   . HCAP (healthcare-associated pneumonia) 02/28/2015  . 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 S/P laparoscopic cholecystectomy 11/30/2014  . Hypokalemia 11/28/2014  . Leukocytosis 11/22/2014  . Thrombocytosis ([place]) 11/22/2014    Past Surgical History:  Procedure Laterality Date  .  CHOLECYSTECTOMY N/A 11/30/2014   Procedure: LAPAROSCOPIC CHOLECYSTECTOMY SINGLE SITE WITH INTRAOPERATIVE CHOLANGIOGRAM;  Surgeon: Michael Boston, MD;  Location: [facility]S;  Service: General;  Laterality: N/A;  . SPLENECTOMY      OB History    Gravida Para Term Preterm AB Living   1       1     SAB TAB Ectopic Multiple Live Births                   Home Medications    Prior to Admission medications   Medication Sig Start Date End Date Taking? Authorizing Provider  oxyCODONE-acetaminophen (PERCOCET) 10-325 MG tablet Take 1 tablet by mouth every 4 (four) hours as needed for severe pain. 05/09/17  Yes [provider]  levofloxacin (LEVAQUIN) 500 MG tablet Take 1 tablet (500 mg total) by mouth daily. Patient not taking: Reported on 05/23/2017 05/22/17   Virgel Manifold, MD  morphine (MS CONTIN) 60 MG 12 hr tablet Take 60 mg by mouth 2 (two) times daily as needed for pain.     [provider]    Family History Family History  Problem Relation Age of Onset  . Hypertension Mother   . Sickle cell anemia Sister   . Kidney disease Sister        Lupus  . Arthritis Sister   . Sickle cell anemia Sister   . Sickle cell trait Sister   . Heart disease Maternal Aunt  CABG  . Heart disease Maternal Uncle        CABG  . Lupus Sister     Social History Social History  Substance Use Topics  . Smoking status: Never Smoker  . Smokeless tobacco: Never Used  . Alcohol use No     Allergies   Patient has no known allergies.   Review of Systems Review of Systems Ten systems reviewed and are negative for acute change, except as noted in the HPI.    Physical Exam Updated Vital Signs BP 122/86   Pulse 72   Temp 98.4 F (36.9 C) (Oral)   Resp 13   Ht 5\' 3"  (1.6 m)   Wt 79.4 kg (175 lb)   LMP 05/05/2017   SpO2 97%   BMI 31.00 kg/m   Physical Exam  Constitutional: She is oriented to person, place, and time. She appears well-developed and well-nourished. No  distress.  HENT:  Head: Normocephalic and atraumatic.  Eyes: Conjunctivae are normal. No scleral icterus.  Neck: Normal range of motion.  Cardiovascular: Normal rate, regular rhythm and normal heart sounds.  Exam reveals no gallop and no friction rub.   No murmur heard. Pulmonary/Chest: Effort normal and breath sounds normal. No respiratory distress.  Abdominal: Soft. Bowel sounds are normal. She exhibits no distension and no mass. There is no tenderness. There is no guarding.  Neurological: She is alert and oriented to person, place, and time.  Skin: Skin is warm and dry. She is not diaphoretic.  Psychiatric: Her behavior is normal.  Nursing note and vitals reviewed.    ED Treatments / Results  Labs (all labs ordered are listed, but only abnormal results are displayed) Labs Reviewed  COMPREHENSIVE METABOLIC PANEL - Abnormal; Notable for the following:       Result Value   Total Protein 8.4 (*)    Total Bilirubin 1.7 (*)    All other components within normal limits  CBC WITH DIFFERENTIAL/PLATELET - Abnormal; Notable for the following:    RBC 3.31 (*)    Hemoglobin 9.6 (*)    HCT 26.8 (*)    Platelets 642 (*)    Monocytes Absolute 1.1 (*)    All other components within normal limits  RETICULOCYTES - Abnormal; Notable for the following:    Retic Ct Pct 7.2 (*)    RBC. 3.31 (*)    Retic Count, Absolute 238.3 (*)    All other components within normal limits    EKG  EKG Interpretation None       Radiology No results found.  Procedures Procedures (including critical care time)  Medications Ordered in ED Medications  HYDROmorphone (DILAUDID) injection 2 mg (not administered)    Or  HYDROmorphone (DILAUDID) injection 2 mg (not administered)  ondansetron (ZOFRAN) injection 4 mg (not administered)  HYDROmorphone (DILAUDID) injection 0.5 mg (0.5 mg Subcutaneous Given 05/28/17 1205)  HYDROmorphone (DILAUDID) injection 2 mg (2 mg Intravenous Given 05/28/17 1344)    Or    HYDROmorphone (DILAUDID) injection 2 mg ( Subcutaneous See Alternative 05/28/17 1344)  HYDROmorphone (DILAUDID) injection 2 mg (2 mg Intravenous Given 05/28/17 1421)    Or  HYDROmorphone (DILAUDID) injection 2 mg ( Subcutaneous See Alternative 05/28/17 1421)  HYDROmorphone (DILAUDID) injection 2 mg (2 mg Intravenous Given 05/28/17 1500)    Or  HYDROmorphone (DILAUDID) injection 2 mg ( Subcutaneous See Alternative 05/28/17 1500)  diphenhydrAMINE (BENADRYL) injection 25 mg (25 mg Intravenous Given 05/28/17 1341)     Initial Impression / Assessment and Plan /  ED Course  I have reviewed the triage vital signs and the nursing notes.  Pertinent labs & imaging results that were available during my care of the patient were reviewed by me and considered in my medical decision making (see chart for details).     Carmen [name] is a 26 y.o. female who presents to ED for pain c/w typical sickle cell crisis. No shortness of breath, fevers or signs of acute chest. Patientt otherwise in no acute distress objectively other than complaint of pain. Given IV fluids and pain medication. Will obtain labs and continue to monitor with admit vs. Discharge based on response and results of testing.   Labs baseline.  Patient re-evaluated and feels much improved. Comfortable with discharge to home. Understands to follow up with PCP and reasons to return to ER. All questions answered.    Final Clinical Impressions(s) / ED Diagnoses   Final diagnoses:  Sickle cell pain crisis [facility])    New Prescriptions New Prescriptions   No medications on file     Margarita Mail, PA-C 05/28/17 1542    Virgel Manifold, MD 05/29/17 [phone]

## 2017-05-28 NOTE — ED Triage Notes (Addendum)
Was discharged from here at 1530 pm and now pain is back and wants to be admitted for the pain. States typical sickle cell pain voiced.

## 2017-05-29 ENCOUNTER — Other Ambulatory Visit: Payer: Self-pay

## 2017-05-29 DIAGNOSIS — Z832 Family history of diseases of the blood and blood-forming organs and certain disorders involving the immune mechanism: Secondary | ICD-10-CM | POA: Diagnosis not present

## 2017-05-29 DIAGNOSIS — D473 Essential (hemorrhagic) thrombocythemia: Secondary | ICD-10-CM | POA: Diagnosis not present

## 2017-05-29 DIAGNOSIS — F6089 Other specific personality disorders: Secondary | ICD-10-CM | POA: Diagnosis present

## 2017-05-29 DIAGNOSIS — K5900 Constipation, unspecified: Secondary | ICD-10-CM | POA: Diagnosis present

## 2017-05-29 DIAGNOSIS — G894 Chronic pain syndrome: Secondary | ICD-10-CM | POA: Diagnosis present

## 2017-05-29 DIAGNOSIS — D5700 Hb-SS disease with crisis, unspecified: Secondary | ICD-10-CM | POA: Diagnosis present

## 2017-05-29 DIAGNOSIS — D638 Anemia in other chronic diseases classified elsewhere: Secondary | ICD-10-CM | POA: Diagnosis present

## 2017-05-29 LAB — CBC
HCT: 27.6 % — ABNORMAL LOW (ref 36.0–46.0)
Hemoglobin: 10 g/dL — ABNORMAL LOW (ref 12.0–15.0)
MCH: 29.7 pg (ref 26.0–34.0)
MCHC: 36.2 g/dL — ABNORMAL HIGH (ref 30.0–36.0)
MCV: 81.9 fL (ref 78.0–100.0)
Platelets: 564 10*3/uL — ABNORMAL HIGH (ref 150–400)
RBC: 3.37 MIL/uL — ABNORMAL LOW (ref 3.87–5.11)
RDW: 15.6 % — AB (ref 11.5–15.5)
WBC: 8.7 10*3/uL (ref 4.0–10.5)

## 2017-05-29 LAB — BASIC METABOLIC PANEL
Anion gap: 9 (ref 5–15)
BUN: 11 mg/dL (ref 6–20)
CHLORIDE: 109 mmol/L (ref 101–111)
CO2: 25 mmol/L (ref 22–32)
CREATININE: 0.53 mg/dL (ref 0.44–1.00)
Calcium: 9 mg/dL (ref 8.9–10.3)
GFR calc non Af Amer: >60 mL/min (ref >60)
Glucose, Bld: 99 mg/dL (ref 65–99)
Potassium: 3.5 mmol/L (ref 3.5–5.1)
SODIUM: 143 mmol/L (ref 135–145)

## 2017-05-29 LAB — RETICULOCYTES
RBC.: 3.37 MIL/uL — ABNORMAL LOW (ref 3.87–5.11)
RETIC CT PCT: 7.3 % — AB (ref 0.4–3.1)
Retic Count, Absolute: 246 10*3/uL — ABNORMAL HIGH (ref 19.0–186.0)

## 2017-05-29 LAB — I-STAT BETA HCG BLOOD, ED (MC, WL, AP ONLY): I-stat hCG, quantitative: <5 m[IU]/mL (ref <5)

## 2017-05-29 LAB — I-STAT TROPONIN, ED: Troponin i, poc: 0 ng/mL (ref 0.00–0.08)

## 2017-05-29 MED ORDER — HYDROMORPHONE HCL-NACL 0.5-0.9 MG/ML-% IV SOSY: 1.0000 mg | PREFILLED_SYRINGE | INTRAVENOUS | Status: AC

## 2017-05-29 MED ORDER — HEPARIN SODIUM (PORCINE) 5000 UNIT/ML IJ SOLN: 4000.0000 [IU] | Freq: Once | INTRAMUSCULAR | Status: DC

## 2017-05-29 MED ORDER — ASPIRIN 81 MG PO CHEW: 324.0000 mg | CHEWABLE_TABLET | Freq: Once | ORAL | Status: DC

## 2017-05-29 MED ORDER — NITROGLYCERIN 0.4 MG SL SUBL: 0.4000 mg | SUBLINGUAL_TABLET | SUBLINGUAL | Status: DC | PRN

## 2017-05-29 MED ORDER — ONDANSETRON HCL 4 MG/2ML IJ SOLN
4.0000 mg | Freq: Four times a day (QID) | INTRAMUSCULAR | Status: DC | PRN
  Administered 2017-05-29 – 2017-05-31 (×3): 4 mg via INTRAVENOUS
  Filled 2017-05-29 (×3): qty 2

## 2017-05-29 MED ORDER — NALOXONE HCL 0.4 MG/ML IJ SOLN: 0.4000 mg | INTRAMUSCULAR | Status: DC | PRN

## 2017-05-29 MED ORDER — ENOXAPARIN SODIUM 40 MG/0.4ML ~~LOC~~ SOLN
40.0000 mg | SUBCUTANEOUS | Status: DC
  Administered 2017-05-29 – 2017-05-30 (×2): 40 mg via SUBCUTANEOUS
  Filled 2017-05-29 (×3): qty 0.4

## 2017-05-29 MED ORDER — DIPHENHYDRAMINE HCL 25 MG PO CAPS
25.0000 mg | ORAL_CAPSULE | ORAL | Status: DC | PRN
  Administered 2017-05-29 – 2017-05-30 (×2): 50 mg via ORAL
  Filled 2017-05-29 (×2): qty 2

## 2017-05-29 MED ORDER — SODIUM CHLORIDE 0.9% FLUSH: 9.0000 mL | INTRAVENOUS | Status: DC | PRN

## 2017-05-29 MED ORDER — SENNOSIDES-DOCUSATE SODIUM 8.6-50 MG PO TABS
1.0000 | ORAL_TABLET | Freq: Two times a day (BID) | ORAL | Status: DC
  Administered 2017-05-29 – 2017-05-31 (×6): 1 via ORAL
  Filled 2017-05-29 (×6): qty 1

## 2017-05-29 MED ORDER — POLYETHYLENE GLYCOL 3350 17 G PO PACK
17.0000 g | PACK | Freq: Every day | ORAL | Status: DC | PRN
  Filled 2017-05-29: qty 1

## 2017-05-29 MED ORDER — SODIUM CHLORIDE 0.9 % IV SOLN
25.0000 mg | INTRAVENOUS | Status: DC | PRN
  Filled 2017-05-29: qty 0.5

## 2017-05-29 MED ORDER — HYDROMORPHONE 1 MG/ML IV SOLN
INTRAVENOUS | Status: DC
Start: 2017-05-29 — End: 2017-05-30
  Administered 2017-05-29 (×2): via INTRAVENOUS
  Administered 2017-05-29: 6 mg via INTRAVENOUS
  Administered 2017-05-29: 7.02 mg via INTRAVENOUS
  Administered 2017-05-29: 1.2 mg via INTRAVENOUS
  Administered 2017-05-29: 9.6 mg via INTRAVENOUS
  Administered 2017-05-29: 3.6 mg via INTRAVENOUS
  Administered 2017-05-30: 14:00:00 via INTRAVENOUS
  Administered 2017-05-30: 7.2 mg via INTRAVENOUS
  Administered 2017-05-30: 6 mg via INTRAVENOUS
  Administered 2017-05-30: 5.4 mg via INTRAVENOUS
  Filled 2017-05-29 (×3): qty 25

## 2017-05-29 MED ORDER — MORPHINE SULFATE ER 30 MG PO TBCR
60.0000 mg | EXTENDED_RELEASE_TABLET | Freq: Two times a day (BID) | ORAL | Status: DC
  Administered 2017-05-29 – 2017-05-31 (×5): 60 mg via ORAL
  Filled 2017-05-29 (×5): qty 2

## 2017-05-29 MED ORDER — KETOROLAC TROMETHAMINE 30 MG/ML IJ SOLN
30.0000 mg | Freq: Four times a day (QID) | INTRAMUSCULAR | Status: DC
  Administered 2017-05-29 – 2017-05-31 (×10): 30 mg via INTRAVENOUS
  Filled 2017-05-29 (×10): qty 1

## 2017-05-29 MED ORDER — HYDROMORPHONE HCL-NACL 0.5-0.9 MG/ML-% IV SOSY
1.0000 mg | PREFILLED_SYRINGE | INTRAVENOUS | Status: AC
  Administered 2017-05-29: 1 mg via INTRAVENOUS
  Filled 2017-05-29: qty 2

## 2017-05-29 NOTE — Progress Notes (Signed)
[place] PROGRESS NOTE  Carmen [name] XIP:[id number] DOB: September 20, 1991 DOA: 05/28/2017 PCP: [name], MD  Assessment/Plan: Active Problems:   Sickle cell anemia with pain ([place])  1. Hb SS with crisis: Pt is having pain but I question wether she is in crisis. I believe that we are in effect treating pain that has been unmasked by not having MS Contin to use. She likely also has induced increased tolerance with increase use of MS Contin. I will continue PCA at current dose and continue Toradol and MS Contin. I will re-assess pain after she has had a full day of MS Contin. 2. Chronic Pain Syndrome: See above. 3. Anemia of Chronic Disease: Hb stable. 4. Cluster B Personality Disorder: Pt vacillates between admiration and demonizing of staff the triggers of which are unclear. Today she is admirable of staff. Will monitor.  Code Status: Full Code Family Communication: N/A Disposition Plan: Not yet ready for discharge  [place].  Pager [phone]. If 7PM-7AM, please contact night-coverage.  05/29/2017, 9:06 AM  LOS: 0 days   Interim History: Carmen [name] is an opiate tolerant patient for whom this is the 3rd hospitalization in the last wee after she left AMA from the last 2 hospitalizations for various reasons. A review of [facility],reveals that she filled a prescription for a one month supply of Oxycodone on 05/09/2017 (#180 tabs) and then filled a prescription on 7/25 for 6 tabs of Oxycodone that was written by the ED Physician on 04/08/2017. This pattern of filling opiate prescriptions would lead to a logical conclusion that she likely does not have any pain medications at home. However patient reports that she filled it because she ran out of her MS Contin due to increase use of MS Contin at the direction of her PMD.   Consultants:  None  Procedures:  None  Antibiotics:  None    Objective: Vitals:   05/28/17 2303 05/29/17 0052 05/29/17 0241 05/29/17 0738  BP:  102/88 (!) 142/98 128/75   Pulse: 92 71 84   Resp: 16 18 16 15   Temp: 98.8 F (37.1 C)  98.1 F (36.7 C)   TempSrc: Oral  Oral   SpO2: 100% 100% 100% 97%  Weight:   79.4 kg (175 lb)   Height:   5\' 3"  (1.6 m)    Weight change:  No intake or output data in the 24 hours ending 05/29/17 0906   Physical Exam General: Alert, awake, oriented x3, in no acute distress.  HEENT: [place]/AT PEERL, EOMI, anicteric. Neck: Trachea midline,  no masses, no thyromegal,y no JVD, no carotid bruit OROPHARYNX:  Moist, No exudate/ erythema/lesions.  Heart: Regular rate and rhythm, without murmurs, rubs, gallops, PMI non-displaced, no heaves or thrills on palpation.  Lungs: Clear to auscultation, no wheezing or rhonchi noted. No increased vocal fremitus resonant to percussion  Abdomen: Soft, nontender, nondistended, positive bowel sounds, no masses no hepatosplenomegaly noted.  Neuro: No focal neurological deficits noted cranial nerves II through XII grossly intact. Strength at baseline in bilateral upper and lower extremities. Musculoskeletal: No warmth swelling or erythema around joints, no spinal tenderness noted. Psychiatric: Patient alert and oriented x3, good insight and cognition, good recent to remote recall.     Data Reviewed: Basic Metabolic Panel:  Recent Labs Lab 05/23/17 0140 05/28/17 1225 05/29/17 0110  NA 139 141 143  K 3.2* 3.6 3.5  CL 103 109 109  CO2 26 25 25   GLUCOSE 96 88 99  BUN <5* 6 11  CREATININE 0.49 0.47 0.53  CALCIUM 9.5 9.6 9.0   Liver Function Tests:  Recent Labs Lab 05/23/17 0140 05/28/17 1225  AST 21 26  ALT 18 34  ALKPHOS 59 61  BILITOT 3.4* 1.7*  PROT 7.6 8.4*  ALBUMIN 4.0 4.2   No results for input(s): LIPASE, AMYLASE in the last 168 hours. No results for input(s): AMMONIA in the last 168 hours. CBC:  Recent Labs Lab 05/23/17 0140 05/28/17 1225 05/29/17 0100  WBC 13.8* 6.2 8.7  NEUTROABS 9.5* 2.7  --   HGB 9.4* 9.6* 10.0*  HCT 26.5* 26.8*  27.6*  MCV 81.3 81.0 81.9  PLT 501* 642* 564*   Cardiac Enzymes:  Recent Labs Lab 05/23/17 0409  TROPONINI <0.03   BNP (last 3 results) No results for input(s): BNP in the last 8760 hours.  ProBNP (last 3 results) No results for input(s): PROBNP in the last 8760 hours.  CBG: No results for input(s): GLUCAP in the last 168 hours.  Recent Results (from the past 240 hour(s))  Blood culture (routine x 2)     Status: None   Collection Time: 05/23/17  3:33 AM  Result Value Ref Range Status   Specimen Description BLOOD LEFT HAND  Final   Special Requests   Final    BOTTLES DRAWN AEROBIC AND ANAEROBIC Blood Culture adequate volume   Culture NO GROWTH 5 DAYS  Final   Report Status 05/28/2017 FINAL  Final  Blood culture (routine x 2)     Status: None   Collection Time: 05/23/17  3:46 AM  Result Value Ref Range Status   Specimen Description BLOOD RIGHT HAND  Final   Special Requests   Final    BOTTLES DRAWN AEROBIC AND ANAEROBIC Blood Culture adequate volume   Culture NO GROWTH 5 DAYS  Final   Report Status 05/28/2017 FINAL  Final     Studies: Dg Chest 2 View  Result Date: 05/29/2017 CLINICAL DATA:  Left-sided chest pain.  Sickle cell disease. EXAM: CHEST  2 VIEW COMPARISON:  05/22/2017 FINDINGS: The lungs are clear. The pulmonary vasculature is normal. Heart size is normal. Hilar and mediastinal contours are unremarkable. There is no pleural effusion. IMPRESSION: No active cardiopulmonary disease. Electronically Signed   By: Andreas Newport M.D.   On: 05/29/2017 00:02   Dg Chest 2 View  Result Date: 05/22/2017 CLINICAL DATA:  Chest pain beginning today with weakness. EXAM: CHEST  2 VIEW COMPARISON:  03/28/2017 FINDINGS: Lungs are somewhat hypoinflated with subtle patchy density left base as cannot exclude atelectasis or early infection. No evidence of effusion. Cardiomediastinal silhouette and remainder of the exam is unchanged. IMPRESSION: Subtle patchy hazy density in the left  base at likely atelectasis, although cannot exclude early infection. Electronically Signed   By: Marin Olp M.D.   On: 05/22/2017 14:43    Scheduled Meds: . enoxaparin (LOVENOX) injection  40 mg Subcutaneous Q24H  . HYDROmorphone   Intravenous Q4H  . ketorolac  30 mg Intravenous Q6H  . morphine  60 mg Oral Q12H  . senna-docusate  1 tablet Oral BID   Continuous Infusions: . sodium chloride 75 mL/hr at 05/29/17 0155  . diphenhydrAMINE (BENADRYL) IVPB(SICKLE CELL ONLY)      Active Problems:   Sickle cell anemia with pain (HCC)    In excess of 25 minutes spent during this visit. Greater than 50% involved face to face contact with the patient for assessment, counseling and coordination of care.

## 2017-05-29 NOTE — H&P (Signed)
History and Physical    Carmen [name] [id number]:[id number] DOB: 07-08-1991 DOA: 05/28/2017  PCP: [name], MD  Patient coming from: Home  I have personally briefly reviewed patient's old medical records in [place]  Chief Complaint: Sickle cell pain crisis  HPI: Carmen [name] is a 26 y.o. female with medical history significant of HGB SS disease, frequent pain crises.  Patent presents to ED for second time today with pain crisis.  Pain throughout body and chest.  No SOB.  Pain like prior crises.   ED Course: Pain persists despite loading doses of IV dilaudid now twice today in ED.   Review of Systems: As per HPI otherwise 10 point review of systems negative.   Past Medical History:  Diagnosis Date  . Abortion    05/2012  . Headache(784.0)   . Sickle cell crisis [facility])     Past Surgical History:  Procedure Laterality Date  . CHOLECYSTECTOMY N/A 11/30/2014   Procedure: LAPAROSCOPIC CHOLECYSTECTOMY SINGLE SITE WITH INTRAOPERATIVE CHOLANGIOGRAM;  Surgeon: Michael Boston, MD;  Location: [facility] ORS;  Service: General;  Laterality: N/A;  . SPLENECTOMY       reports that she has never smoked. She has never used smokeless tobacco. She reports that she does not drink alcohol or use drugs.  No Known Allergies  Family History  Problem Relation Age of Onset  . Hypertension Mother   . Sickle cell anemia Sister   . Kidney disease Sister        Lupus  . Arthritis Sister   . Sickle cell anemia Sister   . Sickle cell trait Sister   . Heart disease Maternal Aunt        CABG  . Heart disease Maternal Uncle        CABG  . Lupus Sister      Prior to Admission medications   Medication Sig Start Date End Date Taking? Authorizing Provider  morphine (MS CONTIN) 60 MG 12 hr tablet Take 60 mg by mouth 2 (two) times daily as needed for pain.     [provider]  oxyCODONE-acetaminophen (PERCOCET) 10-325 MG tablet Take 1 tablet by mouth every 4 (four) hours as needed for  severe pain. 05/09/17   [provider]    Physical Exam: Vitals:   05/28/17 2303 05/29/17 0052  BP: 102/88 (!) 142/98  Pulse: 92 71  Resp: 16 18  Temp: 98.8 F (37.1 C)   TempSrc: Oral   SpO2: 100% 100%    Constitutional: NAD, calm, comfortable Eyes: PERRL, lids and conjunctivae normal ENMT: Mucous membranes are moist. Posterior pharynx clear of any exudate or lesions.Normal dentition.  Neck: normal, supple, no masses, no thyromegaly Respiratory: clear to auscultation bilaterally, no wheezing, no crackles. Normal respiratory effort. No accessory muscle use.  Cardiovascular: Regular rate and rhythm, no murmurs / rubs / gallops. No extremity edema. 2+ pedal pulses. No carotid bruits.  Abdomen: no tenderness, no masses palpated. No hepatosplenomegaly. Bowel sounds positive.  Musculoskeletal: no clubbing / cyanosis. No joint deformity upper and lower extremities. Good ROM, no contractures. Normal muscle tone.  Skin: no rashes, lesions, ulcers. No induration Neurologic: CN 2-12 grossly intact. Sensation intact, DTR normal. Strength 5/5 in all 4.  Psychiatric: Normal judgment and insight. Alert and oriented x 3. Normal mood.    Labs on Admission: I have personally reviewed following labs and imaging studies  CBC:  Recent Labs Lab 05/23/17 0140 05/28/17 1225 05/29/17 0100  WBC 13.8* 6.2 8.7  NEUTROABS  9.5* 2.7  --   HGB 9.4* 9.6* 10.0*  HCT 26.5* 26.8* 27.6*  MCV 81.3 81.0 81.9  PLT 501* 642* 161*   Basic Metabolic Panel:  Recent Labs Lab 05/23/17 0140 05/28/17 1225 05/29/17 0110  NA 139 141 143  K 3.2* 3.6 3.5  CL 103 109 109  CO2 26 25 25   GLUCOSE 96 88 99  BUN <5* 6 11  CREATININE 0.49 0.47 0.53  CALCIUM 9.5 9.6 9.0   GFR: Estimated Creatinine Clearance: 108.2 mL/min (by C-G formula based on SCr of 0.53 mg/dL). Liver Function Tests:  Recent Labs Lab 05/23/17 0140 05/28/17 1225  AST 21 26  ALT 18 34  ALKPHOS 59 61  BILITOT 3.4* 1.7*  PROT 7.6  8.4*  ALBUMIN 4.0 4.2   No results for input(s): LIPASE, AMYLASE in the last 168 hours. No results for input(s): AMMONIA in the last 168 hours. Coagulation Profile: No results for input(s): INR, PROTIME in the last 168 hours. Cardiac Enzymes:  Recent Labs Lab 05/23/17 0409  TROPONINI <0.03   BNP (last 3 results) No results for input(s): PROBNP in the last 8760 hours. HbA1C: No results for input(s): HGBA1C in the last 72 hours. CBG: No results for input(s): GLUCAP in the last 168 hours. Lipid Profile: No results for input(s): CHOL, HDL, LDLCALC, TRIG, CHOLHDL, LDLDIRECT in the last 72 hours. Thyroid Function Tests: No results for input(s): TSH, T4TOTAL, FREET4, T3FREE, THYROIDAB in the last 72 hours. Anemia Panel:  Recent Labs  05/28/17 1225 05/29/17 0100  RETICCTPCT 7.2* 7.3*   Urine analysis:    Component Value Date/Time   COLORURINE YELLOW 05/07/2017 1547   APPEARANCEUR CLEAR 05/07/2017 1547   LABSPEC 1.009 05/07/2017 1547   PHURINE 7.0 05/07/2017 1547   GLUCOSEU NEGATIVE 05/07/2017 1547   HGBUR LARGE (A) 05/07/2017 1547   BILIRUBINUR NEGATIVE 05/07/2017 1547   KETONESUR NEGATIVE 05/07/2017 1547   PROTEINUR NEGATIVE 05/07/2017 1547   UROBILINOGEN 1.0 09/12/2015 1548   NITRITE NEGATIVE 05/07/2017 1547   LEUKOCYTESUR NEGATIVE 05/07/2017 1547    Radiological Exams on Admission: Dg Chest 2 View  Result Date: 05/29/2017 CLINICAL DATA:  Left-sided chest pain.  Sickle cell disease. EXAM: CHEST  2 VIEW COMPARISON:  05/22/2017 FINDINGS: The lungs are clear. The pulmonary vasculature is normal. Heart size is normal. Hilar and mediastinal contours are unremarkable. There is no pleural effusion. IMPRESSION: No active cardiopulmonary disease. Electronically Signed   By: Andreas Newport M.D.   On: 05/29/2017 00:02    EKG: Independently reviewed.  Assessment/Plan Active Problems:   Sickle cell anemia with pain (HCC)    1. Sickle cell pain crisis - 1. Sickle cell  pathway 2. Scheduled toradol 3. Dilaudid PCA 4. Continue home long acting MS contin 5. IVF: half NS at 77 6. HGB 10, looks like baseline is about 9  DVT prophylaxis: Lovenox Code Status: Full Family Communication: No family in room Disposition Plan: Home after admit Consults called: None Admission status: Admit to inpatient - inpatient status for PCA   Etta Quill DO [facility] Pager [phone]  If 7AM-7PM, please contact day team taking care of patient [email or web address] Password [email or web address]  05/29/2017, 1:51 AM

## 2017-05-30 DIAGNOSIS — G894 Chronic pain syndrome: Secondary | ICD-10-CM

## 2017-05-30 DIAGNOSIS — D5700 Hb-SS disease with crisis, unspecified: Principal | ICD-10-CM

## 2017-05-30 DIAGNOSIS — K5900 Constipation, unspecified: Secondary | ICD-10-CM

## 2017-05-30 MED ORDER — MAGNESIUM CITRATE PO SOLN
1.0000 | Freq: Once | ORAL | Status: AC
  Administered 2017-05-30: 1 via ORAL
  Filled 2017-05-30: qty 296

## 2017-05-30 MED ORDER — OXYCODONE HCL 5 MG PO TABS
5.0000 mg | ORAL_TABLET | ORAL | Status: DC
  Administered 2017-05-30 – 2017-05-31 (×5): 5 mg via ORAL
  Filled 2017-05-30 (×5): qty 1

## 2017-05-30 MED ORDER — OXYCODONE-ACETAMINOPHEN 5-325 MG PO TABS
1.0000 | ORAL_TABLET | ORAL | Status: DC
  Administered 2017-05-30 – 2017-05-31 (×5): 1 via ORAL
  Filled 2017-05-30 (×5): qty 1

## 2017-05-30 MED ORDER — HYDROMORPHONE 1 MG/ML IV SOLN
INTRAVENOUS | Status: DC
  Administered 2017-05-30: 3 mg via INTRAVENOUS
  Administered 2017-05-30: 1.5 mg via INTRAVENOUS
  Administered 2017-05-30: 5.39 mg via INTRAVENOUS
  Administered 2017-05-31: 8.4 mg via INTRAVENOUS

## 2017-05-30 NOTE — Progress Notes (Signed)
Carmen [name] PROGRESS NOTE  Carmen [name] ZJI:[id number] DOB: 1991/09/15 DOA: 05/28/2017 PCP: Carmen Hey, MD  Assessment/Plan: Active Problems:   Sickle cell anemia with pain ([place])  1. Hb SS with crisis: Schedule Oxycodone and continue Toradol and MS Contin. I will wean PCA in anticipation of discharge tomorrow.  2. Chronic Pain Syndrome: Continue MS Contin. 3. Anemia of Chronic Disease: Hb stable. 4. Cluster B Personality Disorder: Pt vacillates between admiration and demonizing of staff the triggers of which are unclear. Today she is admirable of staff. Will monitor. 5. Constipation: Continue Senna-S and give Magnesium Citrate at patients request.   Code Status: Full Code Family Communication: N/A Disposition Plan: Not yet ready for discharge  [place].  Pager [phone]. If 7PM-7AM, please contact night-coverage.  05/30/2017, 2:36 PM  LOS: 1 day   Interim History: As expected, pt reports that her pain is well controlled today after receiving the MS Contin for 24 hours. She has still however used 34.55 mg of IV Dilaudid in the last 24 hours which is >650 MME/ day. Although she is requesting discharge today we have negotiated transitioning to her Oxycodone before discharge and possibly discharge tomorrow. Particularly in light of her frequent hospitalizations in the last 2 weeks.   Consultants:  None  Procedures:  None  Antibiotics:  None    Objective: Vitals:   05/30/17 0747 05/30/17 0953 05/30/17 1213 05/30/17 1305  BP:  108/75  117/77  Pulse:  76  66  Resp: 12 12 15 16   Temp:  98.8 F (37.1 C)  99 F (37.2 C)  TempSrc:  Oral  Oral  SpO2: 95% 98% 92% 96%  Weight:      Height:       Weight change: -3.68 kg (-8 lb 1.8 oz)  Intake/Output Summary (Last 24 hours) at 05/30/17 1436 Last data filed at 05/30/17 1306  Gross per 24 hour  Intake              840 ml  Output                0 ml  Net              840 ml     Physical  Exam General: Alert, awake, oriented x3, in no acute distress.  HEENT: [place]/AT PEERL, EOMI, anicteric. Neck: Trachea midline,  no masses, no thyromegal,y no JVD, no carotid bruit OROPHARYNX:  Moist, No exudate/ erythema/lesions.  Heart: Regular rate and rhythm, without murmurs, rubs, gallops, PMI non-displaced, no heaves or thrills on palpation.  Lungs: Clear to auscultation, no wheezing or rhonchi noted. No increased vocal fremitus resonant to percussion  Abdomen: Soft, nontender, nondistended, positive bowel sounds, no masses no hepatosplenomegaly noted.  Neuro: No focal neurological deficits noted cranial nerves II through XII grossly intact. Strength at baseline in bilateral upper and lower extremities. Musculoskeletal: No warmth swelling or erythema around joints, no spinal tenderness noted. Psychiatric: Patient alert and oriented x3, good insight and cognition, good recent to remote recall.     Data Reviewed: Basic Metabolic Panel:  Recent Labs Lab 05/28/17 1225 05/29/17 0110  NA 141 143  K 3.6 3.5  CL 109 109  CO2 25 25  GLUCOSE 88 99  BUN 6 11  CREATININE 0.47 0.53  CALCIUM 9.6 9.0   Liver Function Tests:  Recent Labs Lab 05/28/17 1225  AST 26  ALT 34  ALKPHOS 61  BILITOT 1.7*  PROT 8.4*  ALBUMIN 4.2   No  results for input(s): LIPASE, AMYLASE in the last 168 hours. No results for input(s): AMMONIA in the last 168 hours. CBC:  Recent Labs Lab 05/28/17 1225 05/29/17 0100  WBC 6.2 8.7  NEUTROABS 2.7  --   HGB 9.6* 10.0*  HCT 26.8* 27.6*  MCV 81.0 81.9  PLT 642* 564*   Cardiac Enzymes: No results for input(s): CKTOTAL, CKMB, CKMBINDEX, TROPONINI in the last 168 hours. BNP (last 3 results) No results for input(s): BNP in the last 8760 hours.  ProBNP (last 3 results) No results for input(s): PROBNP in the last 8760 hours.  CBG: No results for input(s): GLUCAP in the last 168 hours.  Recent Results (from the past 240 hour(s))  Blood culture  (routine x 2)     Status: None   Collection Time: 05/23/17  3:33 AM  Result Value Ref Range Status   Specimen Description BLOOD LEFT HAND  Final   Special Requests   Final    BOTTLES DRAWN AEROBIC AND ANAEROBIC Blood Culture adequate volume   Culture NO GROWTH 5 DAYS  Final   Report Status 05/28/2017 FINAL  Final  Blood culture (routine x 2)     Status: None   Collection Time: 05/23/17  3:46 AM  Result Value Ref Range Status   Specimen Description BLOOD RIGHT HAND  Final   Special Requests   Final    BOTTLES DRAWN AEROBIC AND ANAEROBIC Blood Culture adequate volume   Culture NO GROWTH 5 DAYS  Final   Report Status 05/28/2017 FINAL  Final     Studies: Dg Chest 2 View  Result Date: 05/29/2017 CLINICAL DATA:  Left-sided chest pain.  Sickle cell disease. EXAM: CHEST  2 VIEW COMPARISON:  05/22/2017 FINDINGS: The lungs are clear. The pulmonary vasculature is normal. Heart size is normal. Hilar and mediastinal contours are unremarkable. There is no pleural effusion. IMPRESSION: No active cardiopulmonary disease. Electronically Signed   By: Andreas Newport M.D.   On: 05/29/2017 00:02   Dg Chest 2 View  Result Date: 05/22/2017 CLINICAL DATA:  Chest pain beginning today with weakness. EXAM: CHEST  2 VIEW COMPARISON:  03/28/2017 FINDINGS: Lungs are somewhat hypoinflated with subtle patchy density left base as cannot exclude atelectasis or early infection. No evidence of effusion. Cardiomediastinal silhouette and remainder of the exam is unchanged. IMPRESSION: Subtle patchy hazy density in the left base at likely atelectasis, although cannot exclude early infection. Electronically Signed   By: Marin Olp M.D.   On: 05/22/2017 14:43    Scheduled Meds: . enoxaparin (LOVENOX) injection  40 mg Subcutaneous Q24H  . HYDROmorphone   Intravenous Q4H  . ketorolac  30 mg Intravenous Q6H  . morphine  60 mg Oral Q12H  . senna-docusate  1 tablet Oral BID   Continuous Infusions: . sodium chloride 75  mL/hr at 05/30/17 0300  . diphenhydrAMINE (BENADRYL) IVPB(SICKLE CELL ONLY)      Active Problems:   Sickle cell anemia with pain (HCC)    In excess of 25 minutes spent during this visit. Greater than 50% involved face to face contact with the patient for assessment, counseling and coordination of care.

## 2017-05-31 DIAGNOSIS — D473 Essential (hemorrhagic) thrombocythemia: Secondary | ICD-10-CM

## 2017-05-31 MED ORDER — MORPHINE SULFATE ER 60 MG PO TBCR: 60.0000 mg | EXTENDED_RELEASE_TABLET | Freq: Two times a day (BID) | ORAL | 0 refills | Status: DC

## 2017-05-31 MED ORDER — ASPIRIN EC 81 MG PO TBEC: 81.0000 mg | DELAYED_RELEASE_TABLET | Freq: Every day | ORAL | 2 refills | Status: DC

## 2017-05-31 NOTE — Discharge Summary (Addendum)
Carmen [name] MRN: [id number] DOB/AGE: 24/18/1992 24 y.o.  Admit date: 05/28/2017 Discharge date: 05/31/2017  Primary Care Physician:  [name], MD   Discharge Diagnoses:   Patient Active Problem List   Diagnosis Date Noted  . Sickle cell anemia with pain ([place]) 05/29/2017  . 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 Anemia of chronic disease   . S/P laparoscopic cholecystectomy 11/30/2014  . Thrombocytosis ([place]) 11/22/2014  . Systolic ejection murmur 17/51/0258    DISCHARGE MEDICATION: Allergies as of 05/31/2017   No Known Allergies     Medication List    TAKE these medications   morphine 60 MG 12 hr tablet Commonly known as:  MS CONTIN Take 1 tablet (60 mg total) by mouth every 12 (twelve) hours. What changed:  when to take this  reasons to take this   oxyCODONE-acetaminophen 10-325 MG tablet Commonly known as:  PERCOCET Take 1 tablet by mouth every 4 (four) hours as needed for severe pain.   aspirin EC 81 MG tablet Commonly known as:  ASPIRIN Take 1 tablet (81 mg) by mouth daily         Consults:    SIGNIFICANT DIAGNOSTIC STUDIES:  Dg Chest 2 View  Result Date: 05/29/2017 CLINICAL DATA:  Left-sided chest pain.  Sickle cell disease. EXAM: CHEST  2 VIEW COMPARISON:  05/22/2017 FINDINGS: The lungs are clear. The pulmonary vasculature is normal. Heart size is normal. Hilar and mediastinal contours are unremarkable. There is no pleural effusion. IMPRESSION: No active cardiopulmonary disease. Electronically Signed   By: Andreas Newport M.D.   On: 05/29/2017 00:02   Dg Chest 2 View  Result Date: 05/22/2017 CLINICAL DATA:  Chest pain beginning today with weakness. EXAM: CHEST  2 VIEW COMPARISON:  03/28/2017 FINDINGS: Lungs are somewhat hypoinflated with subtle patchy density left base as cannot exclude atelectasis or early infection. No evidence of effusion. Cardiomediastinal silhouette and remainder of the exam is unchanged. IMPRESSION: Subtle  patchy hazy density in the left base at likely atelectasis, although cannot exclude early infection. Electronically Signed   By: Marin Olp M.D.   On: 05/22/2017 14:43       Recent Results (from the past 240 hour(s))  Blood culture (routine x 2)     Status: None   Collection Time: 05/23/17  3:33 AM  Result Value Ref Range Status   Specimen Description BLOOD LEFT HAND  Final   Special Requests   Final    BOTTLES DRAWN AEROBIC AND ANAEROBIC Blood Culture adequate volume   Culture NO GROWTH 5 DAYS  Final   Report Status 05/28/2017 FINAL  Final  Blood culture (routine x 2)     Status: None   Collection Time: 05/23/17  3:46 AM  Result Value Ref Range Status   Specimen Description BLOOD RIGHT HAND  Final   Special Requests   Final    BOTTLES DRAWN AEROBIC AND ANAEROBIC Blood Culture adequate volume   Culture NO GROWTH 5 DAYS  Final   Report Status 05/28/2017 FINAL  Final    BRIEF ADMITTING H & P:  Carmen [name] is a 26 y.o. female with medical history significant of HGB SS disease, frequent pain crises.  Patent presents to ED for second time today with pain crisis.  Pain throughout body and chest.  No SOB.  Pain like prior crises. Carmen [name] was without her MS Contin and thus she had some unmasked chronic pain.   Hospital Course:  Present on Admission: . Sickle cell anemia  with pain ([place]) . Thrombocytosis ([place])  Carmen [name] was admitted with Hb SS with crisis. She was started on Dilaudid via PCA, Toradol and IVF. Pain resolved once she had received her MS Contin for 24 hours and she was transitioned to Oxycodone. At the time of discharge her pain was controlled with MS Contin Oxycodoneand and Toradol without need for any IV medications for breakthrough pain. She is discharged home on pre-hospital home medications. She may benefit from NSAID's to be used adjunctively. However she wants to defer to her PMD-Dr. Alyson Ingles who she will see within the next 24 hours.   Pt also has thrombocytosis  secondary to chronically increased bone marrow activity. She has been started on ASA 81 mg daily.  Pt also had some constipation which was relieved wit Magnesium Citrate.   Of note patient has a longstanding Systolic ejection murmur but has not had an ECHO. I recommend that she be referred for 2-D ECHO as an outpatient to define anatomy and evaluate for early cardiac complications of sickle cell disease.  Disposition and Follow-up: Pt discharged home in stable condition. Per patient she will obtain her prescriptions for her pain medications today.   Discharge Instructions    Activity as tolerated - No restrictions    Complete by:  As directed    Diet general    Complete by:  As directed       DISCHARGE EXAM:  General: Alert, awake, oriented x3, in no apparent distress.  HEENT: [place]/AT PEERL, EOMI, anicteric Neck: Trachea midline, no masses, no thyromegal,y no JVD, no carotid bruit OROPHARYNX: Moist, No exudate/ erythema/lesions.  Heart: Regular rate and rhythm, non-radiating II/VI systolic ejectiont murmur at base. No rubs, gallops or S3. PMI non-displaced. Exam reveals no decreased pulses. Pulmonary/Chest: Normal effort. Breath sounds normal. No. Apnea. Clear to auscultation,no stridor,  no wheezing and no rhonchi noted. No respiratory distress and no tenderness noted. Abdomen: Soft, nontender, nondistended, normal bowel sounds, no masses no hepatosplenomegaly noted. No fluid wave and no ascites. There is no guarding or rebound. Neuro: Alert and oriented to person, place and time. Normal motor skills, Displays no atrophy or tremors and exhibits normal muscle tone.  No focal neurological deficits noted cranial nerves II through XII grossly intact. No sensory deficit noted. Strength at baseline in bilateral upper and lower extremities. Gait normal. Musculoskeletal: No warmth swelling or erythema around joints, no spinal tenderness noted. Psychiatric: Patient alert and oriented x3, good  insight and cognition, good recent to remote recall. Lymph node survey: No cervical axillary or inguinal lymphadenopathy noted. Skin: Skin is warm and dry. No bruising, no ecchymosis and no rash noted. Pt is not diaphoretic. No erythema. No pallor    Blood pressure 106/80, pulse 90, temperature 98.1 F (36.7 C), temperature source Oral, resp. rate 10, height 5\' 3"  (1.6 m), weight 75.7 kg (166 lb 14.2 oz), last menstrual period 05/05/2017, SpO2 94 %.   Recent Labs  05/28/17 1225 05/29/17 0110  NA 141 143  K 3.6 3.5  CL 109 109  CO2 25 25  GLUCOSE 88 99  BUN 6 11  CREATININE 0.47 0.53  CALCIUM 9.6 9.0    Recent Labs  05/28/17 1225  AST 26  ALT 34  ALKPHOS 61  BILITOT 1.7*  PROT 8.4*  ALBUMIN 4.2   No results for input(s): LIPASE, AMYLASE in the last 72 hours.  Recent Labs  05/28/17 1225 05/29/17 0100  WBC 6.2 8.7  NEUTROABS 2.7  --   HGB 9.6*  10.0*  HCT 26.8* 27.6*  MCV 81.0 81.9  PLT 642* 564*     Total time spent including face to face and decision making was greater than 30 minutes  Signed: MATTHEWS,MICHELLE A. 05/31/2017, 9:55 AM

## 2017-05-31 NOTE — Discharge Instructions (Signed)
Heart Murmur A heart murmur is an extra sound that is caused by chaotic blood flow. The murmur can be heard as a "hum" or "whoosh" sound when blood flows through the heart. The heart has four areas called chambers. Valves separate the upper and lower chambers from each other (tricuspid valve and mitral valve) and separate the lower chambers of the heart from pathways that lead away from the heart (aortic valve and pulmonary valve). Normally, the valves open to let blood flow through or out of your heart, and then they shut to keep the blood from flowing backward. There are two types of heart murmurs:  Innocent murmurs. Most people with this type of heart murmur do not have a heart problem. Many children have innocent heart murmurs. Your health care provider may suggest some basic testing to find out whether your murmur is an innocent murmur. If an innocent heart murmur is found, there is no need for further tests or treatment and no need to restrict activities or stop playing sports.  Abnormal murmurs. These types of murmurs can occur in children and adults. Abnormal murmurs may be a sign of a more serious heart condition, such as a heart defect present at birth (congenital defect) or heart valve disease.  What are the causes? This condition is caused by heart valves that are not working properly. In children, abnormal heart murmurs are typically caused by congenital defects. In adults, abnormal murmurs are usually from heart valve problems caused by disease, infection, or aging. Three types of heart valve defects can cause a murmur:  Regurgitation. This is when blood leaks back through the valve in the wrong direction.  Mitral valve prolapse. This is when the mitral valve of the heart has a loose flap and does not close tightly.  Stenosis. This is when a valve does not open enough and blocks blood flow.  This condition may also be caused by:  Pregnancy.  Fever.  Overactive thyroid  gland.  Anemia.  Exercise.  Rapid growth spurts (in children).  What are the signs or symptoms? Innocent murmurs do not cause symptoms, and many people with abnormal murmurs may or may not have symptoms. If symptoms do develop, they may include:  Shortness of breath.  Blue coloring of the skin, especially on the fingertips.  Chest pain.  Palpitations, or feeling a fluttering or skipped heartbeat.  Fainting.  Persistent cough.  Getting tired much faster than expected.  Swelling in the abdomen, feet, or ankles.  How is this diagnosed? This condition may be diagnosed during a routine physical or other exam. If your health care provider hears a murmur with a stethoscope, he or she will listen for:  Where the murmur is located in your heart.  How long the murmur lasts (duration).  When the murmur is heard during the heartbeat.  How loud the murmur is. This may help the health care provider figure out what is causing the murmur.  You may be referred to a heart specialist (cardiologist). You may also have other tests, including:  Electrocardiogram (ECG or EKG). This test measures the electrical activity of your heart.  Echocardiogram. This test uses high frequency sound waves to make pictures of your heart.  MRI or chest X-ray.  Cardiac catheterization. This test looks at blood flow through the heart.  For children and adults who have an abnormal heart murmur and want to stay active, it is important to complete testing, review test results, and receive recommendations from your health care   provider. If heart disease is present, it may not be safe to play or be active. How is this treated? Heart murmurs themselves do not need treatment. In some cases, a heart murmur may go away on its own. If an underlying problem or disease is causing the murmur, you may need treatment. If treatment is needed, it will depend on the type and severity of the disease or heart problem causing  the murmur. Treatment may include:  Medicine.  Surgery.  Dietary and lifestyle changes.  Follow these instructions at home:  Talk with your health care provider before participating in sports or other activities that require a lot of effort and energy (are strenuous).  Learn as much as possible about your condition and any related diseases. Ask your health care provider if you may at risk for any medical emergencies.  Talk with your health care provider about what symptoms you should look out for.  It is up to you to get your test results. Ask your health care provider, or the department that is doing the test, when your results will be ready.  Keep all follow-up visits as told by your health care provider. This is important. Contact a health care provider if:  You feel light-headed.  You are frequently short of breath.  You feel more tired than usual.  You are having a hard time keeping up with normal activities or fitness routines.  You have swelling in your ankles or feet.  You have chest pain.  You notice that your heart often beats irregularly.  You develop any new symptoms. Get help right away if:  You develop severe chest pain.  You are having trouble breathing.  You have fainting spells.  Your symptoms suddenly get worse. These symptoms may represent a serious problem that is an emergency. Do not wait to see if the symptoms will go away. Get medical help right away. Call your local emergency services (911 in the [place] Do not drive yourself to the hospital. Summary  Normally, the heart valves open to let blood flow through or out of your heart, and then they shut to keep the blood from flowing backward.  Heart murmur is caused by heart valves that are not working properly.  You may need treatment if an underlying problem or disease is causing the heart murmur. Treatment may include medicine, surgery, or dietary and lifestyle changes.  Talk with your  health care provider before participating in sports or other activities that require a lot of effort and energy (are strenuous).  Talk with your health care provider about what symptoms you should watch out for. This information is not intended to replace advice given to you by your health care provider. Make sure you discuss any questions you have with your health care provider. Document Released: 11/25/2004 Document Revised: 10/06/2016 Document Reviewed: 10/06/2016 Elsevier Interactive Patient Education  2017 [employer]  

## 2017-05-31 NOTE — Progress Notes (Signed)
Ms. Cleverly IV infiltrated and IV team was unable to start another. She did not want to be stuck anymore.

## 2017-06-01 NOTE — Discharge Summary (Signed)
CHANTEE CERINO MRN: [id number] DOB/AGE: 03-15-1991 26 y.o.  Admit date: 05/23/2017 Discharge date: 06/01/2017  Primary Care Physician:  [name], MD   Discharge Diagnoses:   Patient Active Problem List   Diagnosis Date Noted  . Sickle cell anemia with pain ([place]) 05/29/2017  . 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 Anemia of chronic disease   . S/P laparoscopic cholecystectomy 11/30/2014  . Thrombocytosis ([place]) 11/22/2014  . Systolic ejection murmur 61/60/7371    DISCHARGE MEDICATION: Allergies as of 05/24/2017   No Known Allergies     Medication List    ASK your doctor about these medications   oxyCODONE-acetaminophen 10-325 MG tablet Commonly known as:  PERCOCET Take 1 tablet by mouth every 4 (four) hours as needed for severe pain.      Reconciliation not completed as patient left AGAINST MEDICAL ADVICE.   Consults:    SIGNIFICANT DIAGNOSTIC STUDIES:  Dg Chest 2 View  Result Date: 05/22/2017 CLINICAL DATA:  Chest pain beginning today with weakness. EXAM: CHEST  2 VIEW COMPARISON:  03/28/2017 FINDINGS: Lungs are somewhat hypoinflated with subtle patchy density left base as cannot exclude atelectasis or early infection. No evidence of effusion. Cardiomediastinal silhouette and remainder of the exam is unchanged. IMPRESSION: Subtle patchy hazy density in the left base at likely atelectasis, although cannot exclude early infection. Electronically Signed   By: Marin Olp M.D.   On: 05/22/2017 14:43       Recent Results (from the past 240 hour(s))  Blood culture (routine x 2)     Status: None   Collection Time: 05/23/17  3:33 AM  Result Value Ref Range Status   Specimen Description BLOOD LEFT HAND  Final   Special Requests   Final    BOTTLES DRAWN AEROBIC AND ANAEROBIC Blood Culture adequate volume   Culture NO GROWTH 5 DAYS  Final   Report Status 05/28/2017 FINAL  Final  Blood culture (routine x 2)     Status: None   Collection Time: 05/23/17   3:46 AM  Result Value Ref Range Status   Specimen Description BLOOD RIGHT HAND  Final   Special Requests   Final    BOTTLES DRAWN AEROBIC AND ANAEROBIC Blood Culture adequate volume   Culture NO GROWTH 5 DAYS  Final   Report Status 05/28/2017 FINAL  Final    BRIEF ADMITTING H & P: HEDAYA LATENDRESSE is a 25 y.o. female with medical history significant for sickle cell anemia and chronic thrombocytosis, now presenting to the emergency department for evaluation of diffuse severe pain, fevers, chills, and a mild cough. Patient reports that she had been in her usual state until approximately one week ago when she developed diffuse body aches and pain consistent with her typical sickle cell crises. She also developed subjective fevers and chills around that same time. She notes some slight rhinorrhea and a mild cough, but denies sore throat, abdominal pain, nausea, vomiting, or dysuria. She reports some pain in her chest that is consistent with her prior sickle cell crises. Denies any significant dyspnea. Denies sick contacts or recent long distance travel. She was hospitalized from 05/17/2017 until 05/22/2017 for management of sickle cell pain crisis, and left the hospital AMA.   ED Course: Upon arrival to the ED, patient is found to be afebrile, saturating well on room air, and with vital signs stable. EKG features normal sinus rhythm and chest x-ray is notable for subtle hazy density at the left base felt to represent atelectasis,  but with early infection not excluded. Chemistry panel reveals a potassium of 3.2 and bilirubin of 3.4. CBC features a new leukocytosis to 13,800 with a stable normocytic anemia and improved chronic thrombocytosis. Blood cultures were obtained in the ED, patient was treated with multiple doses of IV Dilaudid, and she was started on empiric Rocephin and azithromycin. She remained hemodynamically stable in the emergency department and has not been in any acute respiratory distress and  will be admitted to the telemetry unit [facility] for ongoing evaluation and management sickle cell disease with pain crisis and fever, cough, leukocytosis, and chest x-ray opacity, concerning for a pneumonia.   Hospital Course:  Present on Admission: . Thrombocytosis ([place]) . Sickle cell pain crisis ([place]) . HCAP  Pt was admitted with Hb SS with vaso-occlusive crisis. She also was found to have findings on the CXR which were assessed by admitted Physician to represent HCAP. The patient was treated for her pain with IV Dilaudid via PCA, Toradol and IVF. For the presumed HCAP she was started on Vancomycin and Cefepime. Based on her clinical appearance at the time of my assessment the Vancomycin was discontinued.  However although my clinical suspicion for pneumonia was low and  after discussion with patient and her insistence that her pre-hospital clinical course was "just like when she had pneumonia in the past" the Cefepime was continued. On the 2nd day of her hospital course, she stated that she had to leave AMA as her mother was hospitalized. Pt left AGAINST MEDICAL ADVICE.   Disposition and Follow-up:  Pt left AGAINST MEDICAL ADVICE.   DISCHARGE EXAM: None. Pt left AGAINST MEDICAL ADVICE.     Total time spent including face to face and decision making was less than 30 minutes  Signed: Jeren Dufrane A. 06/01/2017, 11:14 AM

## 2017-06-13 ENCOUNTER — Emergency Department (HOSPITAL_COMMUNITY)
Admission: EM | Admit: 2017-06-13 | Discharge: 2017-06-13 | Disposition: A | Discharge status: Home / Self Care | Payer: Medicaid Other | Attending: Emergency Medicine | Admitting: Emergency Medicine

## 2017-06-13 ENCOUNTER — Encounter (HOSPITAL_COMMUNITY): Payer: Self-pay | Admitting: Emergency Medicine

## 2017-06-13 DIAGNOSIS — Z7982 Long term (current) use of aspirin: Secondary | ICD-10-CM | POA: Insufficient documentation

## 2017-06-13 DIAGNOSIS — D5700 Hb-SS disease with crisis, unspecified: Secondary | ICD-10-CM

## 2017-06-13 LAB — COMPREHENSIVE METABOLIC PANEL
ALBUMIN: 4.2 g/dL (ref 3.5–5.0)
ALK PHOS: 64 U/L (ref 38–126)
ALT: 25 U/L (ref 14–54)
ANION GAP: 8 (ref 5–15)
AST: 32 U/L (ref 15–41)
BILIRUBIN TOTAL: 1.3 mg/dL — AB (ref 0.3–1.2)
BUN: 8 mg/dL (ref 6–20)
CALCIUM: 9.4 mg/dL (ref 8.9–10.3)
CO2: 27 mmol/L (ref 22–32)
Chloride: 106 mmol/L (ref 101–111)
Creatinine, Ser: 0.56 mg/dL (ref 0.44–1.00)
GLUCOSE: 98 mg/dL (ref 65–99)
POTASSIUM: 3.6 mmol/L (ref 3.5–5.1)
Sodium: 141 mmol/L (ref 135–145)
TOTAL PROTEIN: 8.1 g/dL (ref 6.5–8.1)

## 2017-06-13 LAB — CBC WITH DIFFERENTIAL/PLATELET
BASOS PCT: 0 %
Basophils Absolute: 0 10*3/uL (ref 0.0–0.1)
Eosinophils Absolute: 0.1 10*3/uL (ref 0.0–0.7)
Eosinophils Relative: 2 %
HEMATOCRIT: 25.8 % — AB (ref 36.0–46.0)
HEMOGLOBIN: 9.2 g/dL — AB (ref 12.0–15.0)
LYMPHS PCT: 59 %
Lymphs Abs: 4.6 10*3/uL — ABNORMAL HIGH (ref 0.7–4.0)
MCH: 29.6 pg (ref 26.0–34.0)
MCHC: 35.7 g/dL (ref 30.0–36.0)
MCV: 83 fL (ref 78.0–100.0)
Monocytes Absolute: 0.8 10*3/uL (ref 0.1–1.0)
Monocytes Relative: 10 %
NEUTROS ABS: 2.3 10*3/uL (ref 1.7–7.7)
NEUTROS PCT: 29 %
Platelets: 635 10*3/uL — ABNORMAL HIGH (ref 150–400)
RBC: 3.11 MIL/uL — AB (ref 3.87–5.11)
RDW: 15.4 % (ref 11.5–15.5)
WBC: 7.8 10*3/uL (ref 4.0–10.5)

## 2017-06-13 LAB — RETICULOCYTES
RBC.: 3.11 MIL/uL — AB (ref 3.87–5.11)
RETIC COUNT ABSOLUTE: 195.9 10*3/uL — AB (ref 19.0–186.0)
RETIC CT PCT: 6.3 % — AB (ref 0.4–3.1)

## 2017-06-13 MED ORDER — KETOROLAC TROMETHAMINE 30 MG/ML IJ SOLN
30.0000 mg | Freq: Once | INTRAMUSCULAR | Status: AC
  Administered 2017-06-13: 30 mg via INTRAVENOUS
  Filled 2017-06-13: qty 1

## 2017-06-13 MED ORDER — SODIUM CHLORIDE 0.9 % IV BOLUS (SEPSIS)
500.0000 mL | Freq: Once | INTRAVENOUS | Status: AC
  Administered 2017-06-13: 500 mL via INTRAVENOUS

## 2017-06-13 MED ORDER — HYDROMORPHONE HCL 1 MG/ML IJ SOLN
2.0000 mg | Freq: Once | INTRAMUSCULAR | Status: AC
  Administered 2017-06-13: 2 mg via INTRAVENOUS
  Filled 2017-06-13: qty 2

## 2017-06-13 MED ORDER — HYDROMORPHONE HCL 1 MG/ML IJ SOLN
2.0000 mg | Freq: Once | INTRAMUSCULAR | Status: AC
  Administered 2017-06-13: 2 mg via INTRAMUSCULAR
  Filled 2017-06-13: qty 2

## 2017-06-13 NOTE — ED Triage Notes (Signed)
Pt from home with c/o bilateral leg pain secondary to sickle cell disease x 2 days. Pt is ambulatory at time of assessment. Pt rates pain 10/10. Pt denies CP or SOB. Pt states she last took home meds; percocet at 1030 and morphine at 1300.

## 2017-06-13 NOTE — ED Notes (Signed)
Bed: YD74 Expected date:  Expected time:  Means of arrival:  Comments: Dirty

## 2017-06-13 NOTE — Discharge Instructions (Signed)
It was our pleasure to provide your ER care today - we hope that you feel better.  Rest. Drink adequate fluids.  Take your medication as need.  Follow up with your primary care doctor, and/or at sickle cell clinic in the next 1-2 days if symptoms fail to improve/resolve.  Return to ER if worse, new symptoms, fevers, trouble breathing, other concern.  You were given pain medication in the ER - no driving for the next 6 hours, or anytime when taking opiate-type pain medication.

## 2017-06-13 NOTE — ED Notes (Signed)
Patient states they normally do an ultrasound IV for IV and blood-work. Writer states they attempted straight stick and was unable to get blood.

## 2017-06-13 NOTE — ED Provider Notes (Addendum)
[place] DEPT Provider Note   CSN: [id number] Arrival date & time: 06/13/17  1512     History   Chief Complaint Chief Complaint  Patient presents with  . Sickle Cell Pain Crisis    HPI Carmen [name] is a 26 y.o. female.  Patient with hx sickle cell disease c/o bilateral leg pain c/w pain she has with typical sickle cell pain crisis. Pain for past few days. Constant. Dull. Mod-severe. Denies leg swelling or injury. No numbness/weakness. No skin changes or redness. No fever or chills. Normal appetite, normal po intake. Denies cough or uri c/o. No chest pain or sob.  States compliant w normal meds.    The history is provided by the patient.  Sickle Cell Pain Crisis  Associated symptoms: no chest pain, no cough, no fever, no headaches, no shortness of breath, no sore throat and no vomiting     Past Medical History:  Diagnosis Date  . Abortion    05/2012  . Headache(784.0)   . Sickle cell crisis [facility])     Patient Active Problem List   Diagnosis Date Noted  . Sickle cell anemia with pain ([place]) 05/29/2017  . 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 Anemia of chronic disease   . S/P laparoscopic cholecystectomy 11/30/2014  . Thrombocytosis ([place]) 11/22/2014  . Systolic ejection murmur 92/09/9416    Past Surgical History:  Procedure Laterality Date  . CHOLECYSTECTOMY N/A 11/30/2014   Procedure: LAPAROSCOPIC CHOLECYSTECTOMY SINGLE SITE WITH INTRAOPERATIVE CHOLANGIOGRAM;  Surgeon: Michael Boston, MD;  Location: [facility];  Service: General;  Laterality: N/A;  . SPLENECTOMY      OB History    Gravida Para Term Preterm AB Living   1       1     SAB TAB Ectopic Multiple Live Births                   Home Medications    Prior to Admission medications   Medication Sig Start Date End Date Taking? Authorizing Provider  aspirin EC 81 MG tablet Take 1 tablet (81 mg total) by mouth daily. 05/31/17   Leana Gamer, MD  morphine (MS CONTIN) 60 MG 12 hr tablet  Take 1 tablet (60 mg total) by mouth every 12 (twelve) hours. 05/31/17   Leana Gamer, MD  oxyCODONE-acetaminophen (PERCOCET) 10-325 MG tablet Take 1 tablet by mouth every 4 (four) hours as needed for severe pain. 05/09/17   [provider]    Family History Family History  Problem Relation Age of Onset  . Hypertension Mother   . Sickle cell anemia Sister   . Kidney disease Sister        Lupus  . Arthritis Sister   . Sickle cell anemia Sister   . Sickle cell trait Sister   . Heart disease Maternal Aunt        CABG  . Heart disease Maternal Uncle        CABG  . Lupus Sister     Social History Social History  Substance Use Topics  . Smoking status: Never Smoker  . Smokeless tobacco: Never Used  . Alcohol use No     Allergies   Patient has no known allergies.   Review of Systems Review of Systems  Constitutional: Negative for chills and fever.  HENT: Negative for sore throat.   Eyes: Negative for redness.  Respiratory: Negative for cough and shortness of breath.   Cardiovascular: Negative for chest pain.  Gastrointestinal: Negative for abdominal pain and vomiting.  Genitourinary: Negative for flank pain.  Musculoskeletal: Negative for back pain.  Skin: Negative for rash.  Neurological: Negative for headaches.  Hematological: Does not bruise/bleed easily.  Psychiatric/Behavioral: Negative for confusion.     Physical Exam Updated Vital Signs BP 118/77 (BP Location: Right Arm)   Pulse 84   Temp 98.4 F (36.9 C) (Oral)   Resp 18   Ht 1.6 m (5\' 3" )   Wt 75.3 kg (166 lb)   LMP 06/05/2017   SpO2 100%   BMI 29.41 kg/m   Physical Exam  Constitutional: She appears well-developed and well-nourished. No distress.  HENT:  Head: Atraumatic.  Mouth/Throat: Oropharynx is clear and moist.  Eyes: Pupils are equal, round, and reactive to light. Conjunctivae are normal. No scleral icterus.  Neck: Neck supple. No tracheal deviation present.    Cardiovascular: Normal rate, regular rhythm, normal heart sounds and intact distal pulses.  Exam reveals no gallop and no friction rub.   No murmur heard. Pulmonary/Chest: Effort normal and breath sounds normal. No respiratory distress. She exhibits no tenderness.  Abdominal: Soft. Normal appearance and bowel sounds are normal. She exhibits no distension. There is no tenderness.  Musculoskeletal: She exhibits no edema.  No leg swelling. No focal bony tenderness. Distal pulses palp bil.   Neurological: She is alert.  Skin: Skin is warm and dry. No rash noted. She is not diaphoretic.  Psychiatric: She has a normal mood and affect.  Nursing note and vitals reviewed.    ED Treatments / Results  Labs (all labs ordered are listed, but only abnormal results are displayed) Results for orders placed or performed during the hospital encounter of 06/13/17  Comprehensive metabolic panel  Result Value Ref Range   Sodium 141 135 - 145 mmol/L   Potassium 3.6 3.5 - 5.1 mmol/L   Chloride 106 101 - 111 mmol/L   CO2 27 22 - 32 mmol/L   Glucose, Bld 98 65 - 99 mg/dL   BUN 8 6 - 20 mg/dL   Creatinine, Ser 0.56 0.44 - 1.00 mg/dL   Calcium 9.4 8.9 - 10.3 mg/dL   Total Protein 8.1 6.5 - 8.1 g/dL   Albumin 4.2 3.5 - 5.0 g/dL   AST 32 15 - 41 U/L   ALT 25 14 - 54 U/L   Alkaline Phosphatase 64 38 - 126 U/L   Total Bilirubin 1.3 (H) 0.3 - 1.2 mg/dL   GFR calc non Af Amer >60 >60 mL/min   GFR calc Af Amer >60 >60 mL/min   Anion gap 8 5 - 15  CBC with Differential  Result Value Ref Range   WBC 7.8 4.0 - 10.5 K/uL   RBC 3.11 (L) 3.87 - 5.11 MIL/uL   Hemoglobin 9.2 (L) 12.0 - 15.0 g/dL   HCT 25.8 (L) 36.0 - 46.0 %   MCV 83.0 78.0 - 100.0 fL   MCH 29.6 26.0 - 34.0 pg   MCHC 35.7 30.0 - 36.0 g/dL   RDW 15.4 11.5 - 15.5 %   Platelets 635 (H) 150 - 400 K/uL   Neutrophils Relative % 29 %   Neutro Abs 2.3 1.7 - 7.7 K/uL   Lymphocytes Relative 59 %   Lymphs Abs 4.6 (H) 0.7 - 4.0 K/uL   Monocytes  Relative 10 %   Monocytes Absolute 0.8 0.1 - 1.0 K/uL   Eosinophils Relative 2 %   Eosinophils Absolute 0.1 0.0 - 0.7 K/uL   Basophils Relative 0 %  Basophils Absolute 0.0 0.0 - 0.1 K/uL  Reticulocytes  Result Value Ref Range   Retic Ct Pct 6.3 (H) 0.4 - 3.1 %   RBC. 3.11 (L) 3.87 - 5.11 MIL/uL   Retic Count, Absolute 195.9 (H) 19.0 - 186.0 K/uL   Dg Chest 2 View  Result Date: 05/29/2017 CLINICAL DATA:  Left-sided chest pain.  Sickle cell disease. EXAM: CHEST  2 VIEW COMPARISON:  05/22/2017 FINDINGS: The lungs are clear. The pulmonary vasculature is normal. Heart size is normal. Hilar and mediastinal contours are unremarkable. There is no pleural effusion. IMPRESSION: No active cardiopulmonary disease. Electronically Signed   By: Andreas Newport M.D.   On: 05/29/2017 00:02   Dg Chest 2 View  Result Date: 05/22/2017 CLINICAL DATA:  Chest pain beginning today with weakness. EXAM: CHEST  2 VIEW COMPARISON:  03/28/2017 FINDINGS: Lungs are somewhat hypoinflated with subtle patchy density left base as cannot exclude atelectasis or early infection. No evidence of effusion. Cardiomediastinal silhouette and remainder of the exam is unchanged. IMPRESSION: Subtle patchy hazy density in the left base at likely atelectasis, although cannot exclude early infection. Electronically Signed   By: Marin Olp M.D.   On: 05/22/2017 14:43    EKG  EKG Interpretation None       Radiology No results found.  Procedures Procedures (including critical care time)  Medications Ordered in ED Medications  sodium chloride 0.9 % bolus 500 mL (not administered)  HYDROmorphone (DILAUDID) injection 2 mg (not administered)  ketorolac (TORADOL) 30 MG/ML injection 30 mg (not administered)     Initial Impression / Assessment and Plan / ED Course  I have reviewed the triage vital signs and the nursing notes.  Pertinent labs & imaging results that were available during my care of the patient were reviewed by  me and considered in my medical decision making (see chart for details).  Iv ns 500 cc.   02 [place].  Dilaudid 2 mg iv. toradol iv.   Labs.  Reviewed nursing notes and prior charts for additional history.   Vital signs normal, pt ambulatory about ED, drinking soft drink, appears very comfortable and in no acute distress.   Patient appears stable for d/c.   Recheck pt comfortable.   Final Clinical Impressions(s) / ED Diagnoses   Final diagnoses:  None    New Prescriptions New Prescriptions   No medications on file         Lajean Saver, MD 06/13/17 1907

## 2017-06-16 ENCOUNTER — Encounter (HOSPITAL_COMMUNITY): Payer: Self-pay

## 2017-06-16 ENCOUNTER — Emergency Department (HOSPITAL_COMMUNITY)
Admission: EM | Admit: 2017-06-16 | Discharge: 2017-06-16 | Disposition: A | Discharge status: Home / Self Care | Payer: Medicaid Other | Attending: Emergency Medicine | Admitting: Emergency Medicine

## 2017-06-16 DIAGNOSIS — D5700 Hb-SS disease with crisis, unspecified: Secondary | ICD-10-CM | POA: Insufficient documentation

## 2017-06-16 DIAGNOSIS — Z7982 Long term (current) use of aspirin: Secondary | ICD-10-CM | POA: Insufficient documentation

## 2017-06-16 DIAGNOSIS — R52 Pain, unspecified: Secondary | ICD-10-CM | POA: Diagnosis present

## 2017-06-16 DIAGNOSIS — Z79899 Other long term (current) drug therapy: Secondary | ICD-10-CM | POA: Insufficient documentation

## 2017-06-16 LAB — RETICULOCYTES
RBC.: 3.15 MIL/uL — ABNORMAL LOW (ref 3.87–5.11)
RETIC COUNT ABSOLUTE: 293 10*3/uL — AB (ref 19.0–186.0)
Retic Ct Pct: 9.3 % — ABNORMAL HIGH (ref 0.4–3.1)

## 2017-06-16 LAB — CBC WITH DIFFERENTIAL/PLATELET
BASOS PCT: 0 %
Basophils Absolute: 0 10*3/uL (ref 0.0–0.1)
EOS PCT: 2 %
Eosinophils Absolute: 0.2 10*3/uL (ref 0.0–0.7)
HEMATOCRIT: 25.8 % — AB (ref 36.0–46.0)
HEMOGLOBIN: 8.9 g/dL — AB (ref 12.0–15.0)
LYMPHS PCT: 43 %
Lymphs Abs: 3.5 10*3/uL (ref 0.7–4.0)
MCH: 28.3 pg (ref 26.0–34.0)
MCHC: 34.5 g/dL (ref 30.0–36.0)
MCV: 81.9 fL (ref 78.0–100.0)
MONO ABS: 0.9 10*3/uL (ref 0.1–1.0)
MONOS PCT: 11 %
NEUTROS PCT: 44 %
Neutro Abs: 3.6 10*3/uL (ref 1.7–7.7)
PLATELETS: 746 10*3/uL — AB (ref 150–400)
RBC: 3.15 MIL/uL — AB (ref 3.87–5.11)
RDW: 20 % — ABNORMAL HIGH (ref 11.5–15.5)
WBC: 8.2 10*3/uL (ref 4.0–10.5)

## 2017-06-16 LAB — TYPE AND SCREEN
ABO/RH(D): O POS
ANTIBODY SCREEN: NEGATIVE

## 2017-06-16 LAB — I-STAT BETA HCG BLOOD, ED (MC, WL, AP ONLY)

## 2017-06-16 MED ORDER — HYDROMORPHONE HCL 1 MG/ML IJ SOLN
2.0000 mg | INTRAMUSCULAR | Status: AC
  Administered 2017-06-16: 2 mg via INTRAVENOUS
  Filled 2017-06-16: qty 2

## 2017-06-16 MED ORDER — KETOROLAC TROMETHAMINE 15 MG/ML IJ SOLN
15.0000 mg | INTRAMUSCULAR | Status: AC
  Administered 2017-06-16: 15 mg via INTRAVENOUS
  Filled 2017-06-16: qty 1

## 2017-06-16 MED ORDER — HYDROMORPHONE HCL 1 MG/ML IJ SOLN: 2.0000 mg | INTRAMUSCULAR | Status: AC

## 2017-06-16 MED ORDER — ONDANSETRON HCL 4 MG/2ML IJ SOLN
4.0000 mg | INTRAMUSCULAR | Status: DC | PRN
  Administered 2017-06-16: 4 mg via INTRAVENOUS
  Filled 2017-06-16: qty 2

## 2017-06-16 MED ORDER — SODIUM CHLORIDE 0.45 % IV SOLN
INTRAVENOUS | Status: DC
  Administered 2017-06-16: 06:00:00 via INTRAVENOUS

## 2017-06-16 MED ORDER — DIPHENHYDRAMINE HCL 25 MG PO CAPS
25.0000 mg | ORAL_CAPSULE | ORAL | Status: DC | PRN
  Administered 2017-06-16: 25 mg via ORAL
  Filled 2017-06-16: qty 1

## 2017-06-16 NOTE — ED Provider Notes (Signed)
Still having pain, but improved. Wants to be discharged.    Virgel Manifold, MD 06/16/17 [phone]

## 2017-06-16 NOTE — ED Provider Notes (Signed)
[place] DEPT Provider Note   CSN: [id number] Arrival date & time: 06/16/17  7829     History   Chief Complaint Chief Complaint  Patient presents with  . Sickle Cell Pain Crisis    HPI Carmen [name] is a 26 y.o. female.  HPI Pt comes in with cc of sickle cell pain. Pt reports that she was in a cold room at work yday evening, and her pain got worse. Pt has severe pain in her arms, back and legs. She took her home meds w/ no relief. Pt denies any new infection, fevers, chills.  Past Medical History:  Diagnosis Date  . Abortion    05/2012  . Headache(784.0)   . Sickle cell crisis [facility])     Patient Active Problem List   Diagnosis Date Noted  . Sickle cell anemia with pain ([place]) 05/29/2017  . 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 Anemia of chronic disease   . S/P laparoscopic cholecystectomy 11/30/2014  . Thrombocytosis ([place]) 11/22/2014  . Systolic ejection murmur 56/21/3086    Past Surgical History:  Procedure Laterality Date  . CHOLECYSTECTOMY N/A 11/30/2014   Procedure: LAPAROSCOPIC CHOLECYSTECTOMY SINGLE SITE WITH INTRAOPERATIVE CHOLANGIOGRAM;  Surgeon: Michael Boston, MD;  Location: [facility];  Service: General;  Laterality: N/A;  . SPLENECTOMY      OB History    Gravida Para Term Preterm AB Living   1       1     SAB TAB Ectopic Multiple Live Births                   Home Medications    Prior to Admission medications   Medication Sig Start Date End Date Taking? Authorizing Provider  aspirin EC 81 MG tablet Take 1 tablet (81 mg total) by mouth daily. 05/31/17  Yes Leana Gamer, MD  morphine (MS CONTIN) 60 MG 12 hr tablet Take 1 tablet (60 mg total) by mouth every 12 (twelve) hours. 05/31/17  Yes Leana Gamer, MD  oxyCODONE-acetaminophen (PERCOCET) 10-325 MG tablet Take 1 tablet by mouth every 4 (four) hours as needed for severe pain. 05/09/17  Yes [provider]    Family History Family History  Problem Relation Age  of Onset  . Hypertension Mother   . Sickle cell anemia Sister   . Kidney disease Sister        Lupus  . Arthritis Sister   . Sickle cell anemia Sister   . Sickle cell trait Sister   . Heart disease Maternal Aunt        CABG  . Heart disease Maternal Uncle        CABG  . Lupus Sister     Social History Social History  Substance Use Topics  . Smoking status: Never Smoker  . Smokeless tobacco: Never Used  . Alcohol use No     Allergies   Patient has no known allergies.   Review of Systems Review of Systems  All other systems reviewed and are negative.    Physical Exam Updated Vital Signs BP 121/90 (BP Location: Right Arm)   Pulse 70   Temp 98.2 F (36.8 C) (Oral)   Resp 18   LMP 06/05/2017   SpO2 100%   Physical Exam  Constitutional: She is oriented to person, place, and time. She appears well-developed.  HENT:  Head: Normocephalic and atraumatic.  Eyes: EOM are normal.  Neck: Normal range of motion. Neck supple.  Cardiovascular: Normal rate.  Pulmonary/Chest: Effort normal.  Abdominal: Bowel sounds are normal.  Musculoskeletal: Normal range of motion. She exhibits edema and tenderness. She exhibits no deformity.  Neurological: She is alert and oriented to person, place, and time.  Skin: Skin is warm and dry.  Nursing note and vitals reviewed.    ED Treatments / Results  Labs (all labs ordered are listed, but only abnormal results are displayed) Labs Reviewed  CBC WITH DIFFERENTIAL/PLATELET - Abnormal; Notable for the following:       Result Value   RBC 3.15 (*)    Hemoglobin 8.9 (*)    HCT 25.8 (*)    RDW 20.0 (*)    Platelets 746 (*)    All other components within normal limits  RETICULOCYTES - Abnormal; Notable for the following:    Retic Ct Pct 9.3 (*)    RBC. 3.15 (*)    Retic Count, Absolute 293.0 (*)    All other components within normal limits  COMPREHENSIVE METABOLIC PANEL  I-STAT BETA HCG BLOOD, ED ([facility], [facility], [facility] ONLY)  TYPE AND  SCREEN    EKG  EKG Interpretation None       Radiology No results found.  Procedures Procedures (including critical care time)  Medications Ordered in ED Medications  0.45 % sodium chloride infusion ( Intravenous New Bag/Given 06/16/17 0620)  HYDROmorphone (DILAUDID) injection 2 mg (not administered)    Or  HYDROmorphone (DILAUDID) injection 2 mg (not administered)  HYDROmorphone (DILAUDID) injection 2 mg (not administered)    Or  HYDROmorphone (DILAUDID) injection 2 mg (not administered)  ondansetron (ZOFRAN) injection 4 mg (4 mg Intravenous Given 06/16/17 0620)  diphenhydrAMINE (BENADRYL) capsule 25-50 mg (not administered)  HYDROmorphone (DILAUDID) injection 2 mg (2 mg Intravenous Given 06/16/17 4665)    Or  HYDROmorphone (DILAUDID) injection 2 mg ( Subcutaneous See Alternative 06/16/17 0620)  ketorolac (TORADOL) 15 MG/ML injection 15 mg (15 mg Intravenous Given 06/16/17 0620)     Initial Impression / Assessment and Plan / ED Course  I have reviewed the triage vital signs and the nursing notes.  Pertinent labs & imaging results that were available during my care of the patient were reviewed by me and considered in my medical decision making (see chart for details).     Pt comes in with sickle cell related pain.  VSS and WNL -  hemodynamically stable  Pain appears vaso-occlusive tpye and typical of previous pain. Will start mild hydration with 1/2 NS while patient is getting her workup. Appropriate labs ordered. Pain control with dilaudid started. We will reassess patient after 3 doses. Goal is to break the pain and see if patient feels comfortable going home.  Currently, there is no signs of severe decompensation clinically. Will continue to monitor closely. If we are unable to control the pain, we will admit the patient.   Final Clinical Impressions(s) / ED Diagnoses   Final diagnoses:  Sickle cell pain crisis [facility])    New Prescriptions New Prescriptions    No medications on file     Varney Biles, MD 06/16/17 [phone]

## 2017-06-18 ENCOUNTER — Emergency Department (HOSPITAL_COMMUNITY)
Admission: EM | Admit: 2017-06-18 | Discharge: 2017-06-18 | Disposition: A | Discharge status: Home / Self Care | Payer: Medicaid Other | Attending: Emergency Medicine | Admitting: Emergency Medicine

## 2017-06-18 ENCOUNTER — Encounter (HOSPITAL_COMMUNITY): Payer: Self-pay

## 2017-06-18 DIAGNOSIS — Z7982 Long term (current) use of aspirin: Secondary | ICD-10-CM | POA: Diagnosis not present

## 2017-06-18 DIAGNOSIS — D5700 Hb-SS disease with crisis, unspecified: Secondary | ICD-10-CM | POA: Insufficient documentation

## 2017-06-18 LAB — COMPREHENSIVE METABOLIC PANEL
ALBUMIN: 4.4 g/dL (ref 3.5–5.0)
ALK PHOS: 58 U/L (ref 38–126)
ALT: 15 U/L (ref 14–54)
ANION GAP: 9 (ref 5–15)
AST: 20 U/L (ref 15–41)
BILIRUBIN TOTAL: 3.3 mg/dL — AB (ref 0.3–1.2)
BUN: 7 mg/dL (ref 6–20)
CO2: 24 mmol/L (ref 22–32)
Calcium: 9.7 mg/dL (ref 8.9–10.3)
Chloride: 108 mmol/L (ref 101–111)
Creatinine, Ser: 0.55 mg/dL (ref 0.44–1.00)
GFR calc Af Amer: >60 mL/min (ref >60)
GFR calc non Af Amer: >60 mL/min (ref >60)
GLUCOSE: 94 mg/dL (ref 65–99)
POTASSIUM: 3.6 mmol/L (ref 3.5–5.1)
SODIUM: 141 mmol/L (ref 135–145)
TOTAL PROTEIN: 8.2 g/dL — AB (ref 6.5–8.1)

## 2017-06-18 LAB — CBC WITH DIFFERENTIAL/PLATELET
BASOS ABS: 0 10*3/uL (ref 0.0–0.1)
Basophils Relative: 0 %
EOS PCT: 1 %
Eosinophils Absolute: 0.1 10*3/uL (ref 0.0–0.7)
HEMATOCRIT: 26.8 % — AB (ref 36.0–46.0)
Hemoglobin: 9.5 g/dL — ABNORMAL LOW (ref 12.0–15.0)
LYMPHS PCT: 41 %
Lymphs Abs: 3.3 10*3/uL (ref 0.7–4.0)
MCH: 28.8 pg (ref 26.0–34.0)
MCHC: 35.4 g/dL (ref 30.0–36.0)
MCV: 81.2 fL (ref 78.0–100.0)
MONO ABS: 0.9 10*3/uL (ref 0.1–1.0)
MONOS PCT: 11 %
NEUTROS ABS: 3.8 10*3/uL (ref 1.7–7.7)
Neutrophils Relative %: 47 %
PLATELETS: 618 10*3/uL — AB (ref 150–400)
RBC: 3.3 MIL/uL — ABNORMAL LOW (ref 3.87–5.11)
RDW: 16 % — AB (ref 11.5–15.5)
WBC: 8.1 10*3/uL (ref 4.0–10.5)

## 2017-06-18 LAB — RETICULOCYTES
RBC.: 3.3 MIL/uL — AB (ref 3.87–5.11)
Retic Count, Absolute: 234.3 10*3/uL — ABNORMAL HIGH (ref 19.0–186.0)
Retic Ct Pct: 7.1 % — ABNORMAL HIGH (ref 0.4–3.1)

## 2017-06-18 MED ORDER — HYDROMORPHONE HCL 1 MG/ML IJ SOLN: 2.0000 mg | INTRAMUSCULAR | Status: AC

## 2017-06-18 MED ORDER — ONDANSETRON HCL 4 MG/2ML IJ SOLN
4.0000 mg | INTRAMUSCULAR | Status: DC | PRN
  Administered 2017-06-18: 4 mg via INTRAVENOUS
  Filled 2017-06-18: qty 2

## 2017-06-18 MED ORDER — HYDROMORPHONE HCL 1 MG/ML IJ SOLN
2.0000 mg | Freq: Once | INTRAMUSCULAR | Status: AC
  Administered 2017-06-18: 2 mg via INTRAVENOUS
  Filled 2017-06-18: qty 2

## 2017-06-18 MED ORDER — KETOROLAC TROMETHAMINE 15 MG/ML IJ SOLN
15.0000 mg | INTRAMUSCULAR | Status: AC
  Administered 2017-06-18: 15 mg via INTRAVENOUS
  Filled 2017-06-18: qty 1

## 2017-06-18 MED ORDER — DIPHENHYDRAMINE HCL 50 MG/ML IJ SOLN
25.0000 mg | Freq: Once | INTRAMUSCULAR | Status: AC
  Administered 2017-06-18: 25 mg via INTRAVENOUS
  Filled 2017-06-18: qty 1

## 2017-06-18 MED ORDER — HYDROMORPHONE HCL 1 MG/ML IJ SOLN
2.0000 mg | INTRAMUSCULAR | Status: AC
  Administered 2017-06-18: 2 mg via INTRAVENOUS

## 2017-06-18 MED ORDER — HYDROMORPHONE HCL 1 MG/ML IJ SOLN
2.0000 mg | INTRAMUSCULAR | Status: AC
  Administered 2017-06-18: 2 mg via INTRAVENOUS
  Filled 2017-06-18 (×2): qty 2

## 2017-06-18 MED ORDER — HYDROMORPHONE HCL 1 MG/ML IJ SOLN
2.0000 mg | INTRAMUSCULAR | Status: AC
  Administered 2017-06-18: 2 mg via SUBCUTANEOUS

## 2017-06-18 MED ORDER — SODIUM CHLORIDE 0.45 % IV SOLN
INTRAVENOUS | Status: DC
  Administered 2017-06-18: 10:00:00 via INTRAVENOUS

## 2017-06-18 MED ORDER — HYDROMORPHONE HCL 1 MG/ML IJ SOLN
2.0000 mg | INTRAMUSCULAR | Status: AC
  Filled 2017-06-18: qty 2

## 2017-06-18 NOTE — ED Provider Notes (Signed)
[facility] DEPT Provider Note   CSN: [id number] Arrival date & time: 06/18/17  0746     History   Chief Complaint Chief Complaint  Patient presents with  . Sickle Cell Pain Crisis    HPI Carmen [name] is a 26 y.o. female.  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 Patient states her symptoms started 1 week ago. States the pain is in her bilateral lower legs and low back which is typical of her sickle cell pain. She's been trying morphine and oxycodone at home with little relief. Patient denies any chest pain or shortness of breath. Denies any fever, cough. Nothing makes better or worse. States her last admission was approximately 3 months ago for sickle cell pain crisis. States she has not seen a sickle cell clinic recently.  Pt denies any fever, chill, ha, vision changes, lightheadedness, dizziness, congestion, neck pain, cp, sob, cough, abd pain, n/v/d, urinary symptoms, change in bowel habits, melena, hematochezia, lower extremity paresthesias.  Past Medical History:  Diagnosis Date  . Abortion    05/2012  . Headache(784.0)   . Sickle cell crisis [facility])     Patient Active Problem List   Diagnosis Date Noted  . Sickle cell anemia with pain ([place]) 05/29/2017  . 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 Anemia of chronic disease   . S/P laparoscopic cholecystectomy 11/30/2014  . Thrombocytosis ([place]) 11/22/2014  . Systolic ejection murmur 73/42/8768    Past Surgical History:  Procedure Laterality Date  . CHOLECYSTECTOMY N/A 11/30/2014   Procedure: LAPAROSCOPIC CHOLECYSTECTOMY SINGLE SITE WITH INTRAOPERATIVE CHOLANGIOGRAM;  Surgeon: Michael Boston, MD;  Location: [facility];  Service: General;  Laterality: N/A;  . SPLENECTOMY      OB History    Gravida Para Term Preterm AB Living     1       1     SAB TAB Ectopic Multiple Live Births                   Home Medications    Prior to Admission medications   Medication Sig Start Date End Date Taking? Authorizing Provider  aspirin EC 81 MG tablet Take 1 tablet (81 mg total) by mouth daily. 05/31/17  Yes Leana Gamer, MD  morphine (MS CONTIN) 60 MG 12 hr tablet Take 1 tablet (60 mg total) by mouth every 12 (twelve) hours. 05/31/17  Yes Leana Gamer, MD  oxyCODONE-acetaminophen (PERCOCET) 10-325 MG tablet Take 1 tablet by mouth every 4 (four) hours as needed for severe pain. 05/09/17  Yes [provider]    Family History Family History  Problem Relation Age of Onset  . Hypertension Mother   . Sickle cell anemia Sister   . Kidney disease Sister        Lupus  . Arthritis Sister   . Sickle cell anemia Sister   . Sickle cell trait Sister   . Heart disease Maternal Aunt        CABG  . Heart disease Maternal Uncle        CABG  . Lupus Sister     Social History Social History  Substance Use Topics  . Smoking status: Never Smoker  . Smokeless tobacco: Never Used  . Alcohol use No     Allergies   Patient has no known allergies.   Review  of Systems Review of Systems  Constitutional: Negative for chills and fever.  HENT: Negative for congestion.   Eyes: Negative for visual disturbance.  Respiratory: Negative for cough and shortness of breath.   Cardiovascular: Negative for chest pain.  Gastrointestinal: Negative for abdominal pain, diarrhea, nausea and vomiting.  Genitourinary: Negative for dysuria, flank pain, frequency, hematuria, urgency, vaginal bleeding and vaginal discharge.  Musculoskeletal: Positive for back pain and myalgias. Negative for arthralgias.  Skin: Negative for rash.  Neurological: Negative for dizziness, syncope, weakness, light-headedness, numbness and headaches.  Psychiatric/Behavioral: Negative for sleep disturbance. The patient is not nervous/anxious.       Physical Exam Updated Vital Signs BP 127/86 (BP Location: Left Arm)   Pulse 79   Temp 98.5 F (36.9 C) (Oral)   Resp 16   Ht 5\' 3"  (1.6 m)   Wt 77.1 kg (170 lb)   LMP 06/05/2017   SpO2 97%   BMI 30.11 kg/m   Physical Exam  Constitutional: She is oriented to person, place, and time. She appears well-developed and well-nourished.  Non-toxic appearance. No distress.  HENT:  Head: Normocephalic and atraumatic.  Nose: Nose normal.  Mouth/Throat: Oropharynx is clear and moist.  Eyes: Pupils are equal, round, and reactive to light. Conjunctivae are normal. Right eye exhibits no discharge. Left eye exhibits no discharge. No scleral icterus.  Neck: Normal range of motion. Neck supple.  Cardiovascular: Normal rate, regular rhythm, normal heart sounds and intact distal pulses.  Exam reveals no gallop and no friction rub.   No murmur heard. Pulmonary/Chest: Effort normal and breath sounds normal. No respiratory distress. She has no wheezes. She has no rales. She exhibits no tenderness.  Abdominal: Soft. Bowel sounds are normal. There is no tenderness. There is no rebound and no guarding.  No CVA tenderness bilaterally.  Musculoskeletal: Normal range of motion. She exhibits no tenderness.  Patient with full range of motion of all joints without any overlying erythema. Skin compartments are soft and mildly tender to palpation of the upper bilateral thighs and low back.  DP pulses are 2+ bilaterally. Sensation intact in all dermatomes. Cap refill normal.  Lymphadenopathy:    She has no cervical adenopathy.  Neurological: She is alert and oriented to person, place, and time.  Skin: Skin is warm and dry. Capillary refill takes less than 2 seconds. No pallor.  Psychiatric: Her behavior is normal. Judgment and thought content normal.  Nursing note and vitals reviewed.    ED Treatments / Results  Labs (all labs ordered are listed, but only abnormal results are displayed) Labs Reviewed   COMPREHENSIVE METABOLIC PANEL - Abnormal; Notable for the following:       Result Value   Total Protein 8.2 (*)    Total Bilirubin 3.3 (*)    All other components within normal limits  CBC WITH DIFFERENTIAL/PLATELET - Abnormal; Notable for the following:    RBC 3.30 (*)    Hemoglobin 9.5 (*)    HCT 26.8 (*)    RDW 16.0 (*)    Platelets 618 (*)    All other components within normal limits  RETICULOCYTES - Abnormal; Notable for the following:    Retic Ct Pct 7.1 (*)    RBC. 3.30 (*)    Retic Count, Absolute 234.3 (*)    All other components within normal limits    EKG  EKG Interpretation None       Radiology No results found.  Procedures Procedures (including critical care time)  Medications Ordered  in ED Medications  0.45 % sodium chloride infusion ( Intravenous New Bag/Given 06/18/17 1022)  ondansetron (ZOFRAN) injection 4 mg (4 mg Intravenous Given 06/18/17 1005)  HYDROmorphone (DILAUDID) injection 2 mg (not administered)  ketorolac (TORADOL) 15 MG/ML injection 15 mg (15 mg Intravenous Given 06/18/17 1005)  HYDROmorphone (DILAUDID) injection 2 mg (2 mg Intravenous Given 06/18/17 1208)    Or  HYDROmorphone (DILAUDID) injection 2 mg ( Subcutaneous See Alternative 06/18/17 1208)  HYDROmorphone (DILAUDID) injection 2 mg ( Intravenous See Alternative 06/18/17 1054)    Or  HYDROmorphone (DILAUDID) injection 2 mg (2 mg Subcutaneous Given 06/18/17 1054)  HYDROmorphone (DILAUDID) injection 2 mg (2 mg Intravenous Given 06/18/17 1006)    Or  HYDROmorphone (DILAUDID) injection 2 mg ( Subcutaneous See Alternative 06/18/17 1006)  diphenhydrAMINE (BENADRYL) injection 25 mg (25 mg Intravenous Given 06/18/17 1005)     Initial Impression / Assessment and Plan / ED Course  I have reviewed the triage vital signs and the nursing notes.  Pertinent labs & imaging results that were available during my care of the patient were reviewed by me and considered in my medical decision making (see  chart for details).     Patient resents to the ED with complaints of back pain and bilateral leg pain which is consistent for sickle cell pain crisis. Patient is overall well-appearing and nontoxic. Vital signs are reassuring. Lab work seems consistent with sickle cell crisis. Retake count elevated. No leukocytosis. Hemoglobin at baseline. No signs of acute chest syndrome. All joints are without erythema noted concerning for septic joints.  The patient was given 4 doses of pain medicine with no relief in her pain. However when I go to reevaluate patient she seems to be sleeping in the room. States her pain is 10 out of 10 has not decreased since being here. Patient is well-known to the ED and hospital for sickle cell pain crisis. Spoke with Dr. Doreene Burke with sickle cell medicine he states that this is typical of patient's symptoms and she is well-known to the hospital. He recommends additional dose of medicine and discharge patient home.  Went to speak with patient who is very tearful and states that I was lying to her and just discharging her without her pain being under control. Patient very upset with the treatment that she receives at this hospital and ER.  Spoke back with sickle cell medicine doctor who agrees to admission. Admission orders were replaced by admitting doctor. Patient remains hemodynamically stable this time. She was updated on plan of care   [place] Dr. Gunnar Fusi evaluated patient in the ED. Patient states the fourth dose of medicine made her pain better. She wants to be discharged and does not one be admitted at this time. He has no further recommendations for me.  Pt is hemodynamically stable, in NAD, & able to ambulate in the ED. Evaluation does not show pathology that would require ongoing emergent intervention or inpatient treatment. I explained the diagnosis to the patient. Pain has been managed & has no complaints prior to dc. Pt is comfortable with above plan and is stable for  discharge at this time. All questions were answered prior to disposition. Strict return precautions for f/u to the ED were discussed. Encouraged follow up with PCP.  Final Clinical Impressions(s) / ED Diagnoses   Final diagnoses:  Sickle cell pain crisis [facility])    New Prescriptions New Prescriptions   No medications on file     Aaron Edelman 06/18/17 1316  Doristine Devoid, PA-C 06/18/17 1443    Doristine Devoid, PA-C 06/18/17 1444    Tanna Furry, MD 06/26/17 [phone]

## 2017-06-18 NOTE — ED Triage Notes (Signed)
Patient c/o having sickle cell pain bilateral arms and legs, upper, middle and lower back pain x 1 week. No SOB or chest pain.

## 2017-06-19 ENCOUNTER — Emergency Department (HOSPITAL_COMMUNITY)
Admission: EM | Admit: 2017-06-19 | Discharge: 2017-06-19 | Disposition: A | Discharge status: Home / Self Care | Payer: Medicaid Other | Attending: Emergency Medicine | Admitting: Emergency Medicine

## 2017-06-19 ENCOUNTER — Encounter (HOSPITAL_COMMUNITY): Payer: Self-pay

## 2017-06-19 ENCOUNTER — Emergency Department (HOSPITAL_COMMUNITY)
Admission: EM | Admit: 2017-06-19 | Discharge: 2017-06-20 | Disposition: A | Discharge status: Home / Self Care | Payer: Medicaid Other | Source: Home / Self Care | Attending: Emergency Medicine | Admitting: Emergency Medicine

## 2017-06-19 ENCOUNTER — Encounter (HOSPITAL_COMMUNITY): Payer: Self-pay | Admitting: Emergency Medicine

## 2017-06-19 DIAGNOSIS — D571 Sickle-cell disease without crisis: Secondary | ICD-10-CM | POA: Diagnosis not present

## 2017-06-19 DIAGNOSIS — Z79899 Other long term (current) drug therapy: Secondary | ICD-10-CM | POA: Insufficient documentation

## 2017-06-19 DIAGNOSIS — D5700 Hb-SS disease with crisis, unspecified: Secondary | ICD-10-CM

## 2017-06-19 DIAGNOSIS — M542 Cervicalgia: Secondary | ICD-10-CM | POA: Diagnosis not present

## 2017-06-19 DIAGNOSIS — Z7982 Long term (current) use of aspirin: Secondary | ICD-10-CM | POA: Diagnosis not present

## 2017-06-19 DIAGNOSIS — M549 Dorsalgia, unspecified: Secondary | ICD-10-CM | POA: Diagnosis present

## 2017-06-19 NOTE — ED Triage Notes (Signed)
Pt returns after leaving without being seen with complaints of Sickle Cell pain in her legs and lower back.  Pt reports taking her home medication without relief. Ambulatory.  A&O x4. Rates pain 10/10.

## 2017-06-19 NOTE — ED Notes (Signed)
Pt is not in the lobby

## 2017-06-19 NOTE — ED Notes (Signed)
Pt called for room, no response from lobby or restroom

## 2017-06-19 NOTE — ED Triage Notes (Signed)
Pt c/o sickle cell pain in bilateral legs and lower back. She states that she took her morphine and percocet today without relief. A&Ox4. Denies chest pain, SOB, or N/V. Ambulatory.

## 2017-06-20 ENCOUNTER — Emergency Department (HOSPITAL_COMMUNITY): Payer: Medicaid Other

## 2017-06-20 ENCOUNTER — Encounter (HOSPITAL_COMMUNITY): Payer: Self-pay

## 2017-06-20 ENCOUNTER — Inpatient Hospital Stay (HOSPITAL_COMMUNITY)
Admission: EM | Admit: 2017-06-20 | Discharge: 2017-06-24 | DRG: 812 | Discharge status: Against Medical Advice | Payer: Medicaid Other | Attending: Internal Medicine | Admitting: Internal Medicine

## 2017-06-20 DIAGNOSIS — Z8261 Family history of arthritis: Secondary | ICD-10-CM

## 2017-06-20 DIAGNOSIS — Z841 Family history of disorders of kidney and ureter: Secondary | ICD-10-CM

## 2017-06-20 DIAGNOSIS — J029 Acute pharyngitis, unspecified: Secondary | ICD-10-CM | POA: Diagnosis present

## 2017-06-20 DIAGNOSIS — Z832 Family history of diseases of the blood and blood-forming organs and certain disorders involving the immune mechanism: Secondary | ICD-10-CM

## 2017-06-20 DIAGNOSIS — Z8249 Family history of ischemic heart disease and other diseases of the circulatory system: Secondary | ICD-10-CM

## 2017-06-20 DIAGNOSIS — G894 Chronic pain syndrome: Secondary | ICD-10-CM | POA: Diagnosis present

## 2017-06-20 DIAGNOSIS — D5700 Hb-SS disease with crisis, unspecified: Principal | ICD-10-CM | POA: Diagnosis present

## 2017-06-20 DIAGNOSIS — K5900 Constipation, unspecified: Secondary | ICD-10-CM | POA: Diagnosis present

## 2017-06-20 LAB — COMPREHENSIVE METABOLIC PANEL
ALBUMIN: 4.3 g/dL (ref 3.5–5.0)
ALBUMIN: 4.3 g/dL (ref 3.5–5.0)
ALT: 11 U/L — ABNORMAL LOW (ref 14–54)
ALT: 15 U/L (ref 14–54)
ANION GAP: 9 (ref 5–15)
AST: 22 U/L (ref 15–41)
AST: 23 U/L (ref 15–41)
Alkaline Phosphatase: 60 U/L (ref 38–126)
Alkaline Phosphatase: 61 U/L (ref 38–126)
Anion gap: 10 (ref 5–15)
BILIRUBIN TOTAL: 3.2 mg/dL — AB (ref 0.3–1.2)
BUN: 8 mg/dL (ref 6–20)
CHLORIDE: 105 mmol/L (ref 101–111)
CHLORIDE: 105 mmol/L (ref 101–111)
CO2: 23 mmol/L (ref 22–32)
CO2: 25 mmol/L (ref 22–32)
Calcium: 9.5 mg/dL (ref 8.9–10.3)
Calcium: 9.8 mg/dL (ref 8.9–10.3)
Creatinine, Ser: 0.57 mg/dL (ref 0.44–1.00)
Creatinine, Ser: 0.63 mg/dL (ref 0.44–1.00)
GFR calc Af Amer: >60 mL/min (ref >60)
GFR calc Af Amer: >60 mL/min (ref >60)
GFR calc non Af Amer: >60 mL/min (ref >60)
GLUCOSE: 87 mg/dL (ref 65–99)
Glucose, Bld: 91 mg/dL (ref 65–99)
POTASSIUM: 3.3 mmol/L — AB (ref 3.5–5.1)
POTASSIUM: 3.1 mmol/L — AB (ref 3.5–5.1)
Sodium: 138 mmol/L (ref 135–145)
Sodium: 139 mmol/L (ref 135–145)
TOTAL PROTEIN: 8 g/dL (ref 6.5–8.1)
Total Bilirubin: 5.5 mg/dL — ABNORMAL HIGH (ref 0.3–1.2)
Total Protein: 8.1 g/dL (ref 6.5–8.1)

## 2017-06-20 LAB — CBC WITH DIFFERENTIAL/PLATELET
BASOS ABS: 0 10*3/uL (ref 0.0–0.1)
Basophils Absolute: 0 10*3/uL (ref 0.0–0.1)
Basophils Relative: 0 %
Basophils Relative: 0 %
EOS PCT: 0 %
Eosinophils Absolute: 0 10*3/uL (ref 0.0–0.7)
Eosinophils Absolute: 0 10*3/uL (ref 0.0–0.7)
Eosinophils Relative: 0 %
HCT: 25.8 % — ABNORMAL LOW (ref 36.0–46.0)
HEMATOCRIT: 26.2 % — AB (ref 36.0–46.0)
HEMOGLOBIN: 9.3 g/dL — AB (ref 12.0–15.0)
Hemoglobin: 9.1 g/dL — ABNORMAL LOW (ref 12.0–15.0)
LYMPHS ABS: 1.5 10*3/uL (ref 0.7–4.0)
LYMPHS ABS: 4.7 10*3/uL — AB (ref 0.7–4.0)
LYMPHS PCT: 9 %
LYMPHS PCT: 51 %
MCH: 28.4 pg (ref 26.0–34.0)
MCH: 28.8 pg (ref 26.0–34.0)
MCHC: 35.3 g/dL (ref 30.0–36.0)
MCHC: 35.5 g/dL (ref 30.0–36.0)
MCV: 80.6 fL (ref 78.0–100.0)
MCV: 81.1 fL (ref 78.0–100.0)
MONO ABS: 1.4 10*3/uL — AB (ref 0.1–1.0)
MONOS PCT: 8 %
Monocytes Absolute: 1.1 10*3/uL — ABNORMAL HIGH (ref 0.1–1.0)
Monocytes Relative: 12 %
NEUTROS ABS: 3.4 10*3/uL (ref 1.7–7.7)
Neutro Abs: 13.9 10*3/uL — ABNORMAL HIGH (ref 1.7–7.7)
Neutrophils Relative %: 37 %
Neutrophils Relative %: 83 %
PLATELETS: 551 10*3/uL — AB (ref 150–400)
Platelets: 560 10*3/uL — ABNORMAL HIGH (ref 150–400)
RBC: 3.2 MIL/uL — AB (ref 3.87–5.11)
RBC: 3.23 MIL/uL — AB (ref 3.87–5.11)
RDW: 16.6 % — ABNORMAL HIGH (ref 11.5–15.5)
RDW: 17 % — ABNORMAL HIGH (ref 11.5–15.5)
WBC: 16.8 10*3/uL — AB (ref 4.0–10.5)
WBC: 9.2 10*3/uL (ref 4.0–10.5)

## 2017-06-20 LAB — URINALYSIS, ROUTINE W REFLEX MICROSCOPIC
Bilirubin Urine: NEGATIVE
Glucose, UA: NEGATIVE mg/dL
HGB URINE DIPSTICK: NEGATIVE
KETONES UR: 20 mg/dL — AB
LEUKOCYTES UA: NEGATIVE
Nitrite: NEGATIVE
PROTEIN: NEGATIVE mg/dL
Specific Gravity, Urine: 1.011 (ref 1.005–1.030)
pH: 6 (ref 5.0–8.0)

## 2017-06-20 LAB — RETICULOCYTES
RBC.: 3.2 MIL/uL — ABNORMAL LOW (ref 3.87–5.11)
RBC.: 3.23 MIL/uL — ABNORMAL LOW (ref 3.87–5.11)
RETIC COUNT ABSOLUTE: 264.9 10*3/uL — AB (ref 19.0–186.0)
Retic Count, Absolute: 307.2 10*3/uL — ABNORMAL HIGH (ref 19.0–186.0)
Retic Ct Pct: 8.2 % — ABNORMAL HIGH (ref 0.4–3.1)
Retic Ct Pct: 9.6 % — ABNORMAL HIGH (ref 0.4–3.1)

## 2017-06-20 LAB — RAPID STREP SCREEN (MED CTR MEBANE ONLY): STREPTOCOCCUS, GROUP A SCREEN (DIRECT): NEGATIVE

## 2017-06-20 MED ORDER — HYDROMORPHONE HCL 1 MG/ML IJ SOLN
2.0000 mg | Freq: Once | INTRAMUSCULAR | Status: AC
  Administered 2017-06-20: 2 mg via INTRAVENOUS

## 2017-06-20 MED ORDER — HYDROMORPHONE HCL 1 MG/ML IJ SOLN: 2.0000 mg | INTRAMUSCULAR | Status: AC

## 2017-06-20 MED ORDER — HYDROMORPHONE HCL 1 MG/ML IJ SOLN
2.0000 mg | INTRAMUSCULAR | Status: AC
  Administered 2017-06-20: 2 mg via INTRAVENOUS
  Filled 2017-06-20: qty 2

## 2017-06-20 MED ORDER — DEXTROSE-NACL 5-0.45 % IV SOLN
INTRAVENOUS | Status: DC
Start: 2017-06-20 — End: 2017-06-24
  Administered 2017-06-20: 20:00:00 via INTRAVENOUS
  Administered 2017-06-21: 1000 mL via INTRAVENOUS
  Administered 2017-06-21: 07:00:00 via INTRAVENOUS
  Administered 2017-06-22: 1000 mL via INTRAVENOUS

## 2017-06-20 MED ORDER — HYDROMORPHONE HCL 1 MG/ML IJ SOLN
2.0000 mg | INTRAMUSCULAR | Status: AC
  Filled 2017-06-20: qty 2

## 2017-06-20 MED ORDER — ACETAMINOPHEN 325 MG PO TABS
650.0000 mg | ORAL_TABLET | Freq: Once | ORAL | Status: AC
  Administered 2017-06-20: 650 mg via ORAL

## 2017-06-20 MED ORDER — KETOROLAC TROMETHAMINE 30 MG/ML IJ SOLN
30.0000 mg | INTRAMUSCULAR | Status: AC
  Administered 2017-06-20: 30 mg via INTRAVENOUS
  Filled 2017-06-20: qty 1

## 2017-06-20 MED ORDER — ONDANSETRON HCL 4 MG/2ML IJ SOLN
4.0000 mg | INTRAMUSCULAR | Status: DC | PRN
  Administered 2017-06-20: 4 mg via INTRAVENOUS
  Filled 2017-06-20: qty 2

## 2017-06-20 MED ORDER — ACETAMINOPHEN 325 MG PO TABS
ORAL_TABLET | ORAL | Status: AC
  Filled 2017-06-20: qty 2

## 2017-06-20 MED ORDER — HYDROMORPHONE HCL 1 MG/ML IJ SOLN
2.0000 mg | Freq: Once | INTRAMUSCULAR | Status: AC
  Administered 2017-06-20: 2 mg via INTRAVENOUS
  Filled 2017-06-20: qty 2

## 2017-06-20 MED ORDER — HYDROMORPHONE HCL 1 MG/ML IJ SOLN
2.0000 mg | Freq: Once | INTRAMUSCULAR | Status: AC | PRN
  Administered 2017-06-20: 2 mg via INTRAVENOUS
  Filled 2017-06-20: qty 2

## 2017-06-20 MED ORDER — ACETAMINOPHEN 325 MG PO TABS
650.0000 mg | ORAL_TABLET | Freq: Once | ORAL | Status: AC
  Administered 2017-06-20: 650 mg via ORAL
  Filled 2017-06-20: qty 2

## 2017-06-20 MED ORDER — SODIUM CHLORIDE 0.45 % IV SOLN
INTRAVENOUS | Status: DC
  Administered 2017-06-20: 01:00:00 via INTRAVENOUS

## 2017-06-20 MED ORDER — DIPHENHYDRAMINE HCL 25 MG PO CAPS
25.0000 mg | ORAL_CAPSULE | ORAL | Status: DC | PRN
  Administered 2017-06-20: 25 mg via ORAL
  Filled 2017-06-20: qty 1

## 2017-06-20 NOTE — ED Provider Notes (Signed)
[place] DEPT Provider Note   CSN: [id number] Arrival date & time: 06/20/17  1432     History   Chief Complaint Chief Complaint  Patient presents with  . Sickle Cell Pain Crisis  . URI    HPI Carmen [name] is a 26 y.o. female with past medical history of sickle cell who presents with 2 weeks of persistent generalized body pain. Patient reports generalized myalgias over her bilateral lower extremities, back and shoulders.. Patient has been seen in the emergency department 4 times in the last 2 weeks for similar pain. Patient has not been admitted for her pain. Patient reports that she'll have improvement in pain with pain medications in the emergency department and when she goes home that the pain returns. She denies any changes in pain or symptoms and states that current symptoms are similar to her previous episodes of sickle cell crisis. Patient does report that this morning she developed a mild sore throat which concerned her. Patient has been able to tolerate by mouth without any difficulty. Patient took her regularly prescribed Percocet and ornithine at proximal leg 12:30 PM this afternoon with minimal improvement in pain, probably ED visit. Patient denies any fever, chest pain, difficulty breathing, abdominal pain, nausea/vomiting, cough, redness/swelling of her lower extremities, numbness/weakness of her arms or legs, dysuria, hematuria, vaginal bleeding or vaginal discharge.  The history is provided by the patient.    Past Medical History:  Diagnosis Date  . Abortion    05/2012  . Headache(784.0)   . Sickle cell crisis [facility])     Patient Active Problem List   Diagnosis Date Noted  . Sickle cell pain crisis ([place]) 06/21/2017  . Fever 06/21/2017  . Sickle cell anemia with pain ([place]) 05/29/2017  . 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 Anemia of chronic disease   . S/P laparoscopic cholecystectomy 11/30/2014  . Thrombocytosis ([place]) 11/22/2014  . Systolic  ejection murmur 09/11/2014    Past Surgical History:  Procedure Laterality Date  . CHOLECYSTECTOMY N/A 11/30/2014   Procedure: LAPAROSCOPIC CHOLECYSTECTOMY SINGLE SITE WITH INTRAOPERATIVE CHOLANGIOGRAM;  Surgeon: Michael Boston, MD;  Location: [facility] ORS;  Service: General;  Laterality: N/A;  . SPLENECTOMY      OB History    Gravida Para Term Preterm AB Living   1       1     SAB TAB Ectopic Multiple Live Births                   Home Medications    Prior to Admission medications   Medication Sig Start Date End Date Taking? Authorizing Provider  aspirin EC 81 MG tablet Take 1 tablet (81 mg total) by mouth daily. 05/31/17  Yes Leana Gamer, MD  morphine (MS CONTIN) 60 MG 12 hr tablet Take 1 tablet (60 mg total) by mouth every 12 (twelve) hours. 05/31/17  Yes Leana Gamer, MD  oxyCODONE-acetaminophen (PERCOCET) 10-325 MG tablet Take 1 tablet by mouth every 4 (four) hours as needed for severe pain. 05/09/17  Yes [provider]    Family History Family History  Problem Relation Age of Onset  . Hypertension Mother   . Sickle cell anemia Sister   . Kidney disease Sister        Lupus  . Arthritis Sister   . Sickle cell anemia Sister   . Sickle cell trait Sister   . Heart disease Maternal Aunt        CABG  . Heart  disease Maternal Uncle        CABG  . Lupus Sister     Social History Social History  Substance Use Topics  . Smoking status: Never Smoker  . Smokeless tobacco: Never Used  . Alcohol use No     Allergies   Patient has no known allergies.   Review of Systems Review of Systems  Constitutional: Negative for chills and fever.  HENT: Positive for sore throat. Negative for rhinorrhea and trouble swallowing.   Respiratory: Negative for cough and shortness of breath.   Cardiovascular: Negative for chest pain and leg swelling.  Gastrointestinal: Negative for abdominal pain, diarrhea, nausea and vomiting.  Genitourinary: Negative for dysuria and  hematuria.  Musculoskeletal: Positive for myalgias. Negative for back pain and neck pain.  Skin: Negative for color change and rash.  Neurological: Negative for weakness, numbness and headaches.     Physical Exam Updated Vital Signs BP 112/76 (BP Location: Left Arm)   Pulse 83   Temp (!) 100.8 F (38.2 C) (Oral)   Resp 20   LMP 06/05/2017   SpO2 97%   Physical Exam  Constitutional: She is oriented to person, place, and time. She appears well-developed and well-nourished.  Appears uncomfortable but no acute distress  HENT:  Head: Normocephalic and atraumatic.  Mouth/Throat: Uvula is midline and mucous membranes are normal. No trismus in the jaw. Posterior oropharyngeal erythema present.  Uvula is midline. No evidence of facial or neck swelling. No trismus. Posterior oropharynx is slightly erythematous. No evidence of exudates or edema. No evidence of peritonsillar abscess.  Eyes: Pupils are equal, round, and reactive to light. Conjunctivae, EOM and lids are normal.  Neck: Full passive range of motion without pain.  Cardiovascular: Normal rate, regular rhythm, normal heart sounds and normal pulses.  Exam reveals no gallop and no friction rub.   No murmur heard. Pulmonary/Chest: Effort normal and breath sounds normal. She has no wheezes. She has no rhonchi. She has no rales.  No evidence of respiratory distress. Able to speak in full sentences without difficulty. No tenderness palpation to anterior chest wall.  Abdominal: Soft. Normal appearance. There is no tenderness. There is no rigidity and no guarding.  Musculoskeletal: Normal range of motion.  Neurological: She is alert and oriented to person, place, and time.  5/5 strength bilateral lower extremities.   Skin: Skin is warm and dry. Capillary refill takes less than 2 seconds.  Psychiatric: She has a normal mood and affect. Her speech is normal.  Nursing note and vitals reviewed.    ED Treatments / Results  Labs (all labs  ordered are listed, but only abnormal results are displayed) Labs Reviewed  COMPREHENSIVE METABOLIC PANEL - Abnormal; Notable for the following:       Result Value   Potassium 3.3 (*)    BUN <5 (*)    ALT 11 (*)    Total Bilirubin 5.5 (*)    All other components within normal limits  CBC WITH DIFFERENTIAL/PLATELET - Abnormal; Notable for the following:    WBC 16.8 (*)    RBC 3.20 (*)    Hemoglobin 9.1 (*)    HCT 25.8 (*)    RDW 16.6 (*)    Platelets 551 (*)    Neutro Abs 13.9 (*)    Monocytes Absolute 1.4 (*)    All other components within normal limits  RETICULOCYTES - Abnormal; Notable for the following:    Retic Ct Pct 9.6 (*)    RBC. 3.20 (*)  Retic Count, Absolute 307.2 (*)    All other components within normal limits  URINALYSIS, ROUTINE W REFLEX MICROSCOPIC - Abnormal; Notable for the following:    Ketones, ur 20 (*)    All other components within normal limits  RAPID STREP SCREEN (NOT AT [facility])  CULTURE, GROUP A STREP ([place])  CULTURE, BLOOD (ROUTINE X 2)  CULTURE, BLOOD (ROUTINE X 2)  CBC    EKG  EKG Interpretation None       Radiology Dg Chest 2 View  Result Date: 06/20/2017 CLINICAL DATA:  Cough congestion and fever. Patient with sickle cell disease. EXAM: CHEST  2 VIEW COMPARISON:  05/28/2017 FINDINGS: Upper limits normal heart size noted. There is no evidence of focal airspace disease, pulmonary edema, suspicious pulmonary nodule/mass, pleural effusion, or pneumothorax. No acute bony abnormalities are identified. IMPRESSION: Upper limits normal heart size without evidence of acute cardiopulmonary disease. Electronically Signed   By: Margarette Canada M.D.   On: 06/20/2017 15:33    Procedures Procedures (including critical care time)  Medications Ordered in ED Medications  acetaminophen (TYLENOL) 325 MG tablet (not administered)  dextrose 5 %-0.45 % sodium chloride infusion ( Intravenous New Bag/Given 06/20/17 2001)  HYDROmorphone (DILAUDID) injection 2 mg  (2 mg Intravenous Not Given 06/20/17 2218)    Or  HYDROmorphone (DILAUDID) injection 2 mg ( Subcutaneous See Alternative 06/20/17 2218)  morphine (MS CONTIN) 12 hr tablet 60 mg (not administered)  aspirin EC tablet 81 mg (not administered)  senna-docusate (Senokot-S) tablet 1 tablet (not administered)  polyethylene glycol (MIRALAX / GLYCOLAX) packet 17 g (not administered)  enoxaparin (LOVENOX) injection 40 mg (not administered)  ketorolac (TORADOL) 30 MG/ML injection 30 mg (not administered)  naloxone (NARCAN) injection 0.4 mg (not administered)    And  sodium chloride flush (NS) 0.9 % injection 9 mL (not administered)  ondansetron (ZOFRAN) injection 4 mg (not administered)  diphenhydrAMINE (BENADRYL) capsule 25-50 mg (not administered)    Or  diphenhydrAMINE (BENADRYL) 25 mg in sodium chloride 0.9 % 50 mL IVPB (not administered)  HYDROmorphone (DILAUDID) 1 mg/mL PCA injection (not administered)  acetaminophen (TYLENOL) tablet 650 mg (not administered)  acetaminophen (TYLENOL) tablet 650 mg (650 mg Oral Given 06/20/17 1504)  HYDROmorphone (DILAUDID) injection 2 mg (2 mg Intravenous Given 06/20/17 1956)    Or  HYDROmorphone (DILAUDID) injection 2 mg ( Subcutaneous See Alternative 06/20/17 1956)  HYDROmorphone (DILAUDID) injection 2 mg (2 mg Intravenous Given 06/20/17 2034)    Or  HYDROmorphone (DILAUDID) injection 2 mg ( Subcutaneous See Alternative 06/20/17 2034)  HYDROmorphone (DILAUDID) injection 2 mg (2 mg Intravenous Given 06/20/17 2243)  acetaminophen (TYLENOL) tablet 650 mg (650 mg Oral Given 06/20/17 2312)     Initial Impression / Assessment and Plan / ED Course  I have reviewed the triage vital signs and the nursing notes.  Pertinent labs & imaging results that were available during my care of the patient were reviewed by me and considered in my medical decision making (see chart for details).     26 year old female with past history of sickle cell who presents with 2 weeks of  persistent generalized myalgias. Also reports sore throat that began this morning. No cough, no chest pain, difficulty breathing, no abdominal pain. Seen in the emergency department 4 times in the past 2 weeks for same pain. Patient reports pain improves after pain medications that when she goes home, the pain returns. On initial ED arrival, patient was afebrile 100.5. Antipyretics given. Consider sickle cell crisis versus pharyngitis  versus upper respiratory infection. History/physical exam are not concerning for PE. Plan to check basic labs including CBC, CMP, reticulocyte count, CXR. Will add additional rapid strep and urinalysis for further evaluation. Analgesics provided in the department.  Labs and imaging reviewed. Urinalysis is negative. Chest x-ray is negative for any acute infectious etiology. Rapid strep is negative. CBC shows leukocytosis of 16.8. Records reviewed show that patient was seen earlier today for same symptoms. He had a normal white blood cell count at that time. Hemoglobin and the hematocrit are 9.1 and 25.8. Records reviews is consistent with prior. Reticulocyte count is 9.6. See CMP shows slight hypokalemia at 3.3, otherwise unremarkable. Discussed results with patient. Patient reports that she is still having pain after 2 rounds of pain medication. Will repeat temperature and give her additional pain medication and reevaluate.  Repeat temperature shows the patient is febrile 101.8. Antipyretics given in the department. Suspect that patient has a viral URI or pharyngitis that is contributing to fever.  Reevaluation after 3 rounds of pain medication. Patient reports that she has had minimal improvement in pain. Given pain and fever, concern for sickle cell crisis. Will plan to admit for further evaluation. Hospitalist consultation.  Discussed with Dr. Hurley Cisco (hospitalist). Will plan to admit. Patient will be transferred at [facility] for further evaluation. Updated patient on  plan. Vital signs are stable and patient is safe for transfer at this time.  Final Clinical Impressions(s) / ED Diagnoses   Final diagnoses:  Sickle cell crisis [facility])    New Prescriptions New Prescriptions   No medications on file     Desma Mcgregor 06/21/17 0120    Little, Wenda Overland, MD 06/23/17 2039

## 2017-06-20 NOTE — ED Triage Notes (Signed)
Pt states she has had sickle cell pain X2 weeks. Pt also reports productive cough with green sputum and feeling feverish. Temp 100.5 in triage. No distress noted, ambulatory.

## 2017-06-20 NOTE — ED Notes (Signed)
This RN attempted Iv access, unsuccessful, IV team consulted

## 2017-06-20 NOTE — ED Provider Notes (Signed)
[facility] DEPT Provider Note   CSN: [id number] Arrival date & time: 06/19/17  2232     History   Chief Complaint Chief Complaint  Patient presents with  . Sickle Cell Pain Crisis    HPI Carmen [name] is a 26 y.o. female.  The history is provided by the patient and medical records. No language interpreter was used.  Sickle Cell Pain Crisis   Carmen [name] is a 26 y.o. female  with a PMH of sickle cell anemia who presents to the Emergency Department complaining of Low back and bilateral leg pain consistent with her typical sickle cell crises. She tried her home Percocet and morphine with no improvement. Patient states that her pain has been off and on for the last week and a half. She does been seen in the emergency department 3x in the last week. Each time she feels better, but then pain will return. No fever, chills, joint swelling, redness.   Past Medical History:  Diagnosis Date  . Abortion    05/2012  . Headache(784.0)   . Sickle cell crisis [facility])     Patient Active Problem List   Diagnosis Date Noted  . Sickle cell anemia with pain ([place]) 05/29/2017  . 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 Anemia of chronic disease   . S/P laparoscopic cholecystectomy 11/30/2014  . Thrombocytosis ([place]) 11/22/2014  . Systolic ejection murmur 60/73/7106    Past Surgical History:  Procedure Laterality Date  . CHOLECYSTECTOMY N/A 11/30/2014   Procedure: LAPAROSCOPIC CHOLECYSTECTOMY SINGLE SITE WITH INTRAOPERATIVE CHOLANGIOGRAM;  Surgeon: Michael Boston, MD;  Location: [facility] ORS;  Service: General;  Laterality: N/A;  . SPLENECTOMY      OB History    Gravida Para Term Preterm AB Living   1       1     SAB TAB Ectopic Multiple Live Births                   Home Medications    Prior to Admission medications   Medication Sig Start Date End Date Taking? Authorizing Provider  aspirin EC 81 MG tablet Take 1 tablet (81 mg total) by mouth daily. 05/31/17   Leana Gamer, MD  morphine (MS CONTIN) 60 MG 12 hr tablet Take 1 tablet (60 mg total) by mouth every 12 (twelve) hours. 05/31/17   Leana Gamer, MD  oxyCODONE-acetaminophen (PERCOCET) 10-325 MG tablet Take 1 tablet by mouth every 4 (four) hours as needed for severe pain. 05/09/17   [provider]    Family History Family History  Problem Relation Age of Onset  . Hypertension Mother   . Sickle cell anemia Sister   . Kidney disease Sister        Lupus  . Arthritis Sister   . Sickle cell anemia Sister   . Sickle cell trait Sister   . Heart disease Maternal Aunt        CABG  . Heart disease Maternal Uncle        CABG  . Lupus Sister     Social History Social History  Substance Use Topics  . Smoking status: Never Smoker  . Smokeless tobacco: Never Used  . Alcohol use No     Allergies   Patient has no known allergies.   Review of Systems Review of Systems  Musculoskeletal: Positive for arthralgias and myalgias.  All other systems reviewed and are negative.    Physical Exam Updated Vital Signs BP Marland Kitchen)  146/82 (BP Location: Right Arm)   Temp 98.4 F (36.9 C) (Oral)   Resp 18   LMP 06/05/2017   SpO2 99%   Physical Exam  Constitutional: She is oriented to person, place, and time. She appears well-developed and well-nourished. No distress.  HENT:  Head: Normocephalic and atraumatic.  Cardiovascular: Normal rate, regular rhythm and normal heart sounds.   No murmur heard. Pulmonary/Chest: Effort normal and breath sounds normal. No respiratory distress.  Abdominal: Soft. She exhibits no distension. There is no tenderness.  Musculoskeletal:  Diffuse tenderness to bilateral lower extremities. No joint swelling. No redness, warmth or open wounds. Full range of motion and 5/5 muscle strength.  Neurological: She is alert and oriented to person, place, and time.  Bilateral lower extremities neurovascularly intact.  Skin: Skin is warm and dry.  Nursing note and  vitals reviewed.    ED Treatments / Results  Labs (all labs ordered are listed, but only abnormal results are displayed) Labs Reviewed  COMPREHENSIVE METABOLIC PANEL  CBC WITH DIFFERENTIAL/PLATELET  RETICULOCYTES    EKG  EKG Interpretation None       Radiology No results found.  Procedures Procedures (including critical care time)  Medications Ordered in ED Medications  0.45 % sodium chloride infusion ( Intravenous New Bag/Given 06/20/17 0035)  diphenhydrAMINE (BENADRYL) capsule 25-50 mg (25 mg Oral Given 06/20/17 0035)  ondansetron (ZOFRAN) injection 4 mg (4 mg Intravenous Given 06/20/17 0036)  HYDROmorphone (DILAUDID) injection 2 mg (2 mg Intravenous Given 06/20/17 0036)  ketorolac (TORADOL) 30 MG/ML injection 30 mg (30 mg Intravenous Given 06/20/17 0035)  HYDROmorphone (DILAUDID) injection 2 mg (2 mg Intravenous Given 06/20/17 0125)  HYDROmorphone (DILAUDID) injection 2 mg (2 mg Intravenous Given 06/20/17 0213)     Initial Impression / Assessment and Plan / ED Course  I have reviewed the triage vital signs and the nursing notes.  Pertinent labs & imaging results that were available during my care of the patient were reviewed by me and considered in my medical decision making (see chart for details).    Carmen [name] is a 26 y.o. female who presents to ED for pain c/w typical sickle cell crisis. No shortness of breath, fevers or signs of acute chest. Patient otherwise in no acute distress objectively other than complaint of pain. Given IV fluids and pain medication.   Labs baseline.  Patient re-evaluated and feels much improved. Comfortable with discharge to home. Understands to follow up with PCP and reasons to return to ER. All questions answered.   Final Clinical Impressions(s) / ED Diagnoses   Final diagnoses:  Sickle cell anemia with pain [facility])    New Prescriptions New Prescriptions   No medications on file     Hansford Hirt, Ozella Almond, PA-C 06/20/17 [place], April, MD 06/20/17 [phone]

## 2017-06-20 NOTE — ED Notes (Signed)
Attempted to obtain IV x2.

## 2017-06-20 NOTE — ED Notes (Signed)
Pt given ice chips

## 2017-06-20 NOTE — Discharge Instructions (Signed)
It was my pleasure taking care of you today!   Please follow up with your primary care doctor. You can always call the sickle cell clinic M-F during business hours.   Return to ER for new or worsening symptoms, any additional concerns.

## 2017-06-21 ENCOUNTER — Encounter (HOSPITAL_COMMUNITY): Payer: Self-pay

## 2017-06-21 DIAGNOSIS — J029 Acute pharyngitis, unspecified: Secondary | ICD-10-CM | POA: Diagnosis present

## 2017-06-21 DIAGNOSIS — G894 Chronic pain syndrome: Secondary | ICD-10-CM | POA: Diagnosis present

## 2017-06-21 DIAGNOSIS — Z841 Family history of disorders of kidney and ureter: Secondary | ICD-10-CM | POA: Diagnosis not present

## 2017-06-21 DIAGNOSIS — Z8261 Family history of arthritis: Secondary | ICD-10-CM | POA: Diagnosis not present

## 2017-06-21 DIAGNOSIS — K5900 Constipation, unspecified: Secondary | ICD-10-CM | POA: Diagnosis present

## 2017-06-21 DIAGNOSIS — R509 Fever, unspecified: Secondary | ICD-10-CM | POA: Diagnosis not present

## 2017-06-21 DIAGNOSIS — Z832 Family history of diseases of the blood and blood-forming organs and certain disorders involving the immune mechanism: Secondary | ICD-10-CM | POA: Diagnosis not present

## 2017-06-21 DIAGNOSIS — D5700 Hb-SS disease with crisis, unspecified: Secondary | ICD-10-CM | POA: Diagnosis present

## 2017-06-21 DIAGNOSIS — Z8249 Family history of ischemic heart disease and other diseases of the circulatory system: Secondary | ICD-10-CM | POA: Diagnosis not present

## 2017-06-21 LAB — RAPID STREP SCREEN (MED CTR MEBANE ONLY): STREPTOCOCCUS, GROUP A SCREEN (DIRECT): NEGATIVE

## 2017-06-21 MED ORDER — SODIUM CHLORIDE 0.9% FLUSH: 9.0000 mL | INTRAVENOUS | Status: DC | PRN

## 2017-06-21 MED ORDER — HYDROMORPHONE 1 MG/ML IV SOLN
INTRAVENOUS | Status: DC
  Administered 2017-06-21: 7 mg via INTRAVENOUS
  Administered 2017-06-21: 3 mg via INTRAVENOUS
  Administered 2017-06-21: 6.2 mg via INTRAVENOUS
  Administered 2017-06-21: 2 mg via INTRAVENOUS
  Administered 2017-06-21: 02:00:00 via INTRAVENOUS
  Administered 2017-06-21: 9 mg via INTRAVENOUS
  Administered 2017-06-21 – 2017-06-22 (×2): via INTRAVENOUS
  Administered 2017-06-22: 6.09 mg via INTRAVENOUS
  Administered 2017-06-22: 4.5 mg via INTRAVENOUS
  Administered 2017-06-22: 5.5 mg via INTRAVENOUS
  Administered 2017-06-22: 7 mg via INTRAVENOUS
  Administered 2017-06-22: 3.5 mg via INTRAVENOUS
  Filled 2017-06-21 (×3): qty 25

## 2017-06-21 MED ORDER — NALOXONE HCL 0.4 MG/ML IJ SOLN: 0.4000 mg | INTRAMUSCULAR | Status: DC | PRN

## 2017-06-21 MED ORDER — POLYETHYLENE GLYCOL 3350 17 G PO PACK
17.0000 g | PACK | Freq: Every day | ORAL | Status: DC | PRN
  Administered 2017-06-23: 17 g via ORAL
  Filled 2017-06-21: qty 1

## 2017-06-21 MED ORDER — HYDROMORPHONE HCL-NACL 0.5-0.9 MG/ML-% IV SOSY: 1.0000 mg | PREFILLED_SYRINGE | INTRAVENOUS | Status: DC

## 2017-06-21 MED ORDER — ASPIRIN EC 81 MG PO TBEC
81.0000 mg | DELAYED_RELEASE_TABLET | Freq: Every day | ORAL | Status: DC
  Administered 2017-06-21 – 2017-06-24 (×4): 81 mg via ORAL
  Filled 2017-06-21 (×4): qty 1

## 2017-06-21 MED ORDER — SENNOSIDES-DOCUSATE SODIUM 8.6-50 MG PO TABS
1.0000 | ORAL_TABLET | Freq: Two times a day (BID) | ORAL | Status: DC
  Administered 2017-06-21 – 2017-06-23 (×6): 1 via ORAL
  Filled 2017-06-21 (×7): qty 1

## 2017-06-21 MED ORDER — KETOROLAC TROMETHAMINE 30 MG/ML IJ SOLN
30.0000 mg | Freq: Four times a day (QID) | INTRAMUSCULAR | Status: DC
  Administered 2017-06-21 – 2017-06-24 (×15): 30 mg via INTRAVENOUS
  Filled 2017-06-21 (×15): qty 1

## 2017-06-21 MED ORDER — HYDROMORPHONE BOLUS VIA INFUSION
1.0000 mg | INTRAVENOUS | Status: AC
Start: 2017-06-21 — End: 2017-06-22
  Administered 2017-06-21 – 2017-06-22 (×5): 1 mg via INTRAVENOUS
  Filled 2017-06-21 (×5): qty 1

## 2017-06-21 MED ORDER — MORPHINE SULFATE ER 30 MG PO TBCR
60.0000 mg | EXTENDED_RELEASE_TABLET | Freq: Two times a day (BID) | ORAL | Status: DC
  Administered 2017-06-21 – 2017-06-24 (×7): 60 mg via ORAL
  Filled 2017-06-21 (×7): qty 2

## 2017-06-21 MED ORDER — ONDANSETRON HCL 4 MG/2ML IJ SOLN
4.0000 mg | Freq: Four times a day (QID) | INTRAMUSCULAR | Status: DC | PRN
  Administered 2017-06-21 – 2017-06-24 (×4): 4 mg via INTRAVENOUS
  Filled 2017-06-21 (×4): qty 2

## 2017-06-21 MED ORDER — SODIUM CHLORIDE 0.9 % IV SOLN
25.0000 mg | INTRAVENOUS | Status: DC | PRN
  Filled 2017-06-21: qty 0.5

## 2017-06-21 MED ORDER — ACETAMINOPHEN 325 MG PO TABS: 650.0000 mg | ORAL_TABLET | Freq: Four times a day (QID) | ORAL | Status: DC | PRN

## 2017-06-21 MED ORDER — MENTHOL 3 MG MT LOZG
1.0000 | LOZENGE | OROMUCOSAL | Status: DC | PRN
  Administered 2017-06-21: 3 mg via ORAL
  Filled 2017-06-21: qty 9

## 2017-06-21 MED ORDER — ENOXAPARIN SODIUM 40 MG/0.4ML ~~LOC~~ SOLN
40.0000 mg | Freq: Every day | SUBCUTANEOUS | Status: DC
  Filled 2017-06-21 (×2): qty 0.4

## 2017-06-21 MED ORDER — HYDROMORPHONE HCL-NACL 0.5-0.9 MG/ML-% IV SOSY
1.0000 mg | PREFILLED_SYRINGE | Freq: Once | INTRAVENOUS | Status: AC
  Administered 2017-06-21: 1 mg via INTRAVENOUS
  Filled 2017-06-21: qty 2

## 2017-06-21 MED ORDER — DIPHENHYDRAMINE HCL 25 MG PO CAPS: 25.0000 mg | ORAL_CAPSULE | ORAL | Status: DC | PRN

## 2017-06-21 NOTE — H&P (Signed)
History and Physical    Carmen [name] ZHG:[id number] DOB: 1991/10/24 DOA: 06/20/2017  PCP: [name], MD  Patient coming from: Home  I have personally briefly reviewed patient's old medical records in [place]  Chief Complaint: Sickle cell pain crisis  HPI: Carmen [name] is a 26 y.o. female with medical history significant of HGB SS disease, frequent pain crises.  Patent presents to ED with pain crisis.  Pain throughout body and chest.  No SOB.  Pain like prior crises.   ED Course: Pain persists despite loading doses of IV dilaudid. Does have fever of 101.8 today as well.  WBC 16.8 up from 9.2 just last evening.  UA neg, CXR neg.  HGB 9.1 down from 9.3 yesterday.  Looks like 9 is about her baseline.  Patient does have productive cough with greenish sputum.  Strep test also negative.   Review of Systems: As per HPI otherwise 10 point review of systems negative.   Past Medical History:  Diagnosis Date  . Abortion    05/2012  . Headache(784.0)   . Sickle cell crisis [facility])     Past Surgical History:  Procedure Laterality Date  . CHOLECYSTECTOMY N/A 11/30/2014   Procedure: LAPAROSCOPIC CHOLECYSTECTOMY SINGLE SITE WITH INTRAOPERATIVE CHOLANGIOGRAM;  Surgeon: Michael Boston, MD;  Location: [facility];  Service: General;  Laterality: N/A;  . SPLENECTOMY       reports that she has never smoked. She has never used smokeless tobacco. She reports that she does not drink alcohol or use drugs.  No Known Allergies  Family History  Problem Relation Age of Onset  . Hypertension Mother   . Sickle cell anemia Sister   . Kidney disease Sister        Lupus  . Arthritis Sister   . Sickle cell anemia Sister   . Sickle cell trait Sister   . Heart disease Maternal Aunt        CABG  . Heart disease Maternal Uncle        CABG  . Lupus Sister      Prior to Admission medications   Medication Sig Start Date End Date Taking? Authorizing Provider  morphine (MS CONTIN) 60 MG 12 hr  tablet Take 60 mg by mouth 2 (two) times daily as needed for pain.     [provider]  oxyCODONE-acetaminophen (PERCOCET) 10-325 MG tablet Take 1 tablet by mouth every 4 (four) hours as needed for severe pain. 05/09/17   [provider]    Physical Exam: Vitals:   06/20/17 2249 06/20/17 2300 06/20/17 2330 06/21/17 0000  BP:  136/80 112/71 107/70  Pulse:  85 87 80  Resp:  17 20 (!) 24  Temp: (!) 101.8 F (38.8 C)     TempSrc: Oral     SpO2:  94% 96% 95%    Constitutional: NAD, calm, comfortable Eyes: PERRL, lids and conjunctivae normal ENMT: Mucous membranes are moist. Posterior pharynx clear of any exudate or lesions.Normal dentition.  Neck: normal, supple, no masses, no thyromegaly Respiratory: clear to auscultation bilaterally, no wheezing, no crackles. Normal respiratory effort. No accessory muscle use.  Cardiovascular: Regular rate and rhythm, no murmurs / rubs / gallops. No extremity edema. 2+ pedal pulses. No carotid bruits.  Abdomen: no tenderness, no masses palpated. No hepatosplenomegaly. Bowel sounds positive.  Musculoskeletal: no clubbing / cyanosis. No joint deformity upper and lower extremities. Good ROM, no contractures. Normal muscle tone.  Skin: no rashes, lesions, ulcers. No induration Neurologic: CN  2-12 grossly intact. Sensation intact, DTR normal. Strength 5/5 in all 4.  Psychiatric: Normal judgment and insight. Alert and oriented x 3. Normal mood.    Labs on Admission: I have personally reviewed following labs and imaging studies  CBC:  Recent Labs Lab 06/16/17 0545 06/18/17 0756 06/20/17 0010 06/20/17 1511  WBC 8.2 8.1 9.2 16.8*  NEUTROABS 3.6 3.8 3.4 13.9*  HGB 8.9* 9.5* 9.3* 9.1*  HCT 25.8* 26.8* 26.2* 25.8*  MCV 81.9 81.2 81.1 80.6  PLT 746* 618* 560* 637*   Basic Metabolic Panel:  Recent Labs Lab 06/18/17 0756 06/20/17 0010 06/20/17 1511  NA 141 139 138  K 3.6 3.1* 3.3*  CL 108 105 105  CO2 24 25 23   GLUCOSE 94 91 87    BUN 7 8 <5*  CREATININE 0.55 0.63 0.57  CALCIUM 9.7 9.8 9.5   GFR: Estimated Creatinine Clearance: 106.6 mL/min (by C-G formula based on SCr of 0.57 mg/dL). Liver Function Tests:  Recent Labs Lab 06/18/17 0756 06/20/17 0010 06/20/17 1511  AST 20 23 22   ALT 15 15 11*  ALKPHOS 58 61 60  BILITOT 3.3* 3.2* 5.5*  PROT 8.2* 8.0 8.1  ALBUMIN 4.4 4.3 4.3   No results for input(s): LIPASE, AMYLASE in the last 168 hours. No results for input(s): AMMONIA in the last 168 hours. Coagulation Profile: No results for input(s): INR, PROTIME in the last 168 hours. Cardiac Enzymes: No results for input(s): CKTOTAL, CKMB, CKMBINDEX, TROPONINI in the last 168 hours. BNP (last 3 results) No results for input(s): PROBNP in the last 8760 hours. HbA1C: No results for input(s): HGBA1C in the last 72 hours. CBG: No results for input(s): GLUCAP in the last 168 hours. Lipid Profile: No results for input(s): CHOL, HDL, LDLCALC, TRIG, CHOLHDL, LDLDIRECT in the last 72 hours. Thyroid Function Tests: No results for input(s): TSH, T4TOTAL, FREET4, T3FREE, THYROIDAB in the last 72 hours. Anemia Panel:  Recent Labs  06/20/17 0010 06/20/17 1511  RETICCTPCT 8.2* 9.6*   Urine analysis:    Component Value Date/Time   COLORURINE YELLOW 06/20/2017 2316   APPEARANCEUR CLEAR 06/20/2017 2316   LABSPEC 1.011 06/20/2017 2316   PHURINE 6.0 06/20/2017 2316   GLUCOSEU NEGATIVE 06/20/2017 2316   HGBUR NEGATIVE 06/20/2017 2316   BILIRUBINUR NEGATIVE 06/20/2017 2316   KETONESUR 20 (A) 06/20/2017 2316   PROTEINUR NEGATIVE 06/20/2017 2316   UROBILINOGEN 1.0 09/12/2015 1548   NITRITE NEGATIVE 06/20/2017 2316   LEUKOCYTESUR NEGATIVE 06/20/2017 2316    Radiological Exams on Admission: Dg Chest 2 View  Result Date: 06/20/2017 CLINICAL DATA:  Cough congestion and fever. Patient with sickle cell disease. EXAM: CHEST  2 VIEW COMPARISON:  05/28/2017 FINDINGS: Upper limits normal heart size noted. There is no  evidence of focal airspace disease, pulmonary edema, suspicious pulmonary nodule/mass, pleural effusion, or pneumothorax. No acute bony abnormalities are identified. IMPRESSION: Upper limits normal heart size without evidence of acute cardiopulmonary disease. Electronically Signed   By: Margarette Canada M.D.   On: 06/20/2017 15:33    EKG: Independently reviewed.  Assessment/Plan Principal Problem:   Sickle cell pain crisis ([place]) Active Problems:   Fever    1. Sickle cell pain crisis - 1. Sickle cell pathway 2. Scheduled toradol 3. Dilaudid PCA 4. Continue home long acting MS contin 5. IVF: D5 half NS at 100 6. HGB 9, looks like baseline is about 9 2. Fever - 1. Additionally patient does have fever today as well as URI symptoms 2. CXR neg, UA  neg, rapid strep is neg 3. Possibly just a viral URI? 4. PRN tylenol in addition to scheduled ketorlac 5. Will get BCx 6. Could just be due to vaso-occlusive crisis 7. Dont really have an indication for ABx at this point so will hold off for now 8. Repeat CBC tomorrow AM  DVT prophylaxis: Lovenox Code Status: Full Family Communication: No family in room Disposition Plan: Home after admit Consults called: None Admission status: Admit to inpatient - inpatient status for PCA   Etta Quill DO [facility] Pager [phone]  If 7AM-7PM, please contact day team taking care of patient [email or web address] Password [email or web address]  06/21/2017, 12:20 AM

## 2017-06-21 NOTE — Progress Notes (Signed)
Patient vomited x 1 approximately 200cc, greenish in color. Zofran 4 mg IV administered earlier.

## 2017-06-21 NOTE — Progress Notes (Addendum)
SICKLE CELL SERVICE PROGRESS NOTE  Carmen [name] XIP:[id number] DOB: 11/13/1990 DOA: 06/20/2017 PCP: [name], MD  Assessment/Plan: Principal Problem:   Sickle cell pain crisis ([place]) Active Problems:   Fever  1. Pharyngitis: Will re-swab throat as now with exudate present. So far no further fevers. If fever develops will start on antibiotic coverage for GPC- strept species.  2. Hb SS with crisis: Continue PCA and schedule intermittent doses of Dilaudid overnight. Continue Toradol and IVF. 3. Fever: Pt has a low grade fever yesterday. Will continue to monitor.  4. Chronic Pain Syndrome: Continue MS Contin   Code Status: Full Code Family Communication: N/A Disposition Plan: Not yet ready for discharge  [place].  Pager [phone]. If 7PM-7AM, please contact night-coverage.  06/21/2017, 4:41 PM  LOS: 0 days   Interim History: Pt reports that she has been having throat pain x 2 days. Tactile fevers at home and now pain "all over" at an intensity of 9/10.  In the last 14 hours she has used 22 mg of IV Dilaudid with 45/44:demands/deliveries on the PCA.   Consultants:  None  Procedures:  None  Antibiotics:  None    Objective: Vitals:   06/21/17 0524 06/21/17 0800 06/21/17 0930 06/21/17 1150  BP: 130/83  129/85   Pulse: 65  67   Resp: 14 16 19 13   Temp: 99.5 F (37.5 C)  99 F (37.2 C)   TempSrc: Oral  Oral   SpO2: 97% 97% 96% 95%  Weight:      Height:       Weight change:   Intake/Output Summary (Last 24 hours) at 06/21/17 1641 Last data filed at 06/21/17 1508  Gross per 24 hour  Intake              782 ml  Output              200 ml  Net              582 ml     Physical Exam General: Alert, awake, oriented x3, in no acute distress.  HEENT: [place]/AT PEERL, EOMI, anicteric Neck: Trachea midline,  no masses, no thyromegal,y no JVD, no carotid bruit OROPHARYNX:  Moist, whitish exudate noted on left tonsil. Heart: Regular rate and rhythm,  without murmurs, rubs, gallops, PMI non-displaced, no heaves or thrills on palpation.  Lungs: Clear to auscultation, no wheezing or rhonchi noted. No increased vocal fremitus resonant to percussion  Abdomen: Soft, nontender, nondistended, positive bowel sounds, no masses no hepatosplenomegaly noted.  Neuro: No focal neurological deficits noted cranial nerves II through XII grossly intact.  Strength at functional baseline in bilateral upper and lower extremities. Musculoskeletal: No warm swelling or erythema around joints, no spinal tenderness noted. Psychiatric: Patient alert and oriented x3, good insight and cognition, good recent to remote recall.     Data Reviewed: Basic Metabolic Panel:  Recent Labs Lab 06/18/17 0756 06/20/17 0010 06/20/17 1511  NA 141 139 138  K 3.6 3.1* 3.3*  CL 108 105 105  CO2 24 25 23   GLUCOSE 94 91 87  BUN 7 8 <5*  CREATININE 0.55 0.63 0.57  CALCIUM 9.7 9.8 9.5   Liver Function Tests:  Recent Labs Lab 06/18/17 0756 06/20/17 0010 06/20/17 1511  AST 20 23 22   ALT 15 15 11*  ALKPHOS 58 61 60  BILITOT 3.3* 3.2* 5.5*  PROT 8.2* 8.0 8.1  ALBUMIN 4.4 4.3 4.3   No results for input(s): LIPASE, AMYLASE in  the last 168 hours. No results for input(s): AMMONIA in the last 168 hours. CBC:  Recent Labs Lab 06/16/17 0545 06/18/17 0756 06/20/17 0010 06/20/17 1511  WBC 8.2 8.1 9.2 16.8*  NEUTROABS 3.6 3.8 3.4 13.9*  HGB 8.9* 9.5* 9.3* 9.1*  HCT 25.8* 26.8* 26.2* 25.8*  MCV 81.9 81.2 81.1 80.6  PLT 746* 618* 560* 551*   Cardiac Enzymes: No results for input(s): CKTOTAL, CKMB, CKMBINDEX, TROPONINI in the last 168 hours. BNP (last 3 results) No results for input(s): BNP in the last 8760 hours.  ProBNP (last 3 results) No results for input(s): PROBNP in the last 8760 hours.  CBG: No results for input(s): GLUCAP in the last 168 hours.  Recent Results (from the past 240 hour(s))  Rapid strep screen     Status: None   Collection Time: 06/20/17   8:38 PM  Result Value Ref Range Status   Streptococcus, Group A Screen (Direct) NEGATIVE NEGATIVE Final    Comment: (NOTE) A Rapid Antigen test may result negative if the antigen level in the sample is below the detection level of this test. The FDA has not cleared this test as a stand-alone test therefore the rapid antigen negative result has reflexed to a Group A Strep culture.   Culture, group A strep     Status: None (Preliminary result)   Collection Time: 06/20/17  8:38 PM  Result Value Ref Range Status   Specimen Description THROAT  Final   Special Requests NONE Reflexed from [id number]  Final   Culture TOO YOUNG TO READ  Final   Report Status PENDING  Incomplete     Studies: Dg Chest 2 View  Result Date: 06/20/2017 CLINICAL DATA:  Cough congestion and fever. Patient with sickle cell disease. EXAM: CHEST  2 VIEW COMPARISON:  05/28/2017 FINDINGS: Upper limits normal heart size noted. There is no evidence of focal airspace disease, pulmonary edema, suspicious pulmonary nodule/mass, pleural effusion, or pneumothorax. No acute bony abnormalities are identified. IMPRESSION: Upper limits normal heart size without evidence of acute cardiopulmonary disease. Electronically Signed   By: Margarette Canada M.D.   On: 06/20/2017 15:33   Dg Chest 2 View  Result Date: 05/29/2017 CLINICAL DATA:  Left-sided chest pain.  Sickle cell disease. EXAM: CHEST  2 VIEW COMPARISON:  05/22/2017 FINDINGS: The lungs are clear. The pulmonary vasculature is normal. Heart size is normal. Hilar and mediastinal contours are unremarkable. There is no pleural effusion. IMPRESSION: No active cardiopulmonary disease. Electronically Signed   By: Andreas Newport M.D.   On: 05/29/2017 00:02    Scheduled Meds: . aspirin EC  81 mg Oral Daily  . enoxaparin (LOVENOX) injection  40 mg Subcutaneous Daily  . HYDROmorphone   Intravenous Q4H  .  HYDROmorphone (DILAUDID) injection  1 mg Intravenous Q3H  . ketorolac  30 mg Intravenous  Q6H  . morphine  60 mg Oral Q12H  . senna-docusate  1 tablet Oral BID   Continuous Infusions: . dextrose 5 % and 0.45% NaCl 100 mL/hr at 06/21/17 3536  . diphenhydrAMINE (BENADRYL) IVPB(SICKLE CELL ONLY)      Principal Problem:   Sickle cell pain crisis ([place]) Active Problems:   Fever    In excess of 35 minutes spent during this visit. Greater than 50% involved face to face contact with the patient for assessment, counseling and coordination of care.

## 2017-06-21 NOTE — ED Notes (Signed)
[facility] contacted for tx to [id number], Dr. Blaine Hamper receiving

## 2017-06-22 DIAGNOSIS — J029 Acute pharyngitis, unspecified: Secondary | ICD-10-CM

## 2017-06-22 DIAGNOSIS — D5700 Hb-SS disease with crisis, unspecified: Principal | ICD-10-CM

## 2017-06-22 LAB — EXPECTORATED SPUTUM ASSESSMENT W REFEX TO RESP CULTURE: SPECIAL REQUESTS: NORMAL

## 2017-06-22 LAB — EXPECTORATED SPUTUM ASSESSMENT W GRAM STAIN, RFLX TO RESP C

## 2017-06-22 MED ORDER — HYDROMORPHONE 1 MG/ML IV SOLN
INTRAVENOUS | Status: DC
  Administered 2017-06-22: 7 mg via INTRAVENOUS
  Administered 2017-06-22: 2 mg via INTRAVENOUS
  Administered 2017-06-23: 4.5 mg via INTRAVENOUS
  Administered 2017-06-23: 05:00:00 via INTRAVENOUS
  Administered 2017-06-23: 1 mg via INTRAVENOUS
  Administered 2017-06-23: 3.5 mg via INTRAVENOUS
  Administered 2017-06-23: 9 mg via INTRAVENOUS
  Filled 2017-06-22: qty 25

## 2017-06-22 MED ORDER — ZOLPIDEM TARTRATE 5 MG PO TABS
5.0000 mg | ORAL_TABLET | Freq: Every evening | ORAL | Status: DC | PRN
  Administered 2017-06-22 – 2017-06-23 (×2): 5 mg via ORAL
  Filled 2017-06-22 (×2): qty 1

## 2017-06-22 NOTE — Progress Notes (Signed)
Dr. Zigmund Daniel paged for decreased HR

## 2017-06-22 NOTE — Progress Notes (Signed)
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 Pharyngitis:Improved. Repeat swab negative for Group A Strept and culture is negative for group A strept.  2.  Hb SS with crisis: Continue PCA and schedule intermittent doses of Dilaudid overnight. Continue Toradol and IVF. 3. Fever: Resolved. Chronic Pain Syndrome: Continue MS Contin    Code Status: Full Code Family Communication: N/A Disposition Plan: Not yet ready for discharge  [place].  Pager [phone]. If 7PM-7AM, please contact night-coverage.  06/22/2017, 5:03 PM  LOS: 1 day   Interim History: Pt reports that throat pain is improved but all other pain is 9/10 and localized to low back, arms, legs and ankles. She has used 31.5 mg of Dilaudid on the PCA with 60/53:demands/deliveries in the last 24 hours. Reports that she had a BM today  Consultants:  None  Procedures:  None  Antibiotics:  None    Objective: Vitals:   06/22/17 1119 06/22/17 1200 06/22/17 1600 06/22/17 1614  BP:   112/87   Pulse:   71   Resp: 16 12 11 12   Temp:   98.6 F (37 C)   TempSrc:   Oral   SpO2: 98% 98% 96% 96%  Weight:      Height:       Weight change: -0.216 kg (-7.6 oz)  Intake/Output Summary (Last 24 hours) at 06/22/17 1703 Last data filed at 06/22/17 1406  Gross per 24 hour  Intake             1188 ml  Output              100 ml  Net             1088 ml     Physical Exam General: Alert, awake, oriented x3, in no apparent distress.  HEENT: [place]/AT PEERL, EOMI, anicteric OROPHARYNX:  Moist no exudate, erythema or lesions noted in anterior or posterior oropharynx. Heart: Regular rate and rhythm, without murmurs, rubs, gallops, PMI non-displaced, no heaves or thrills on palpation.  Lungs: Clear to auscultation, no wheezing or rhonchi noted.  No increased vocal fremitus resonant to percussion  Abdomen: Soft, nontender, nondistended, positive bowel sounds, no masses no hepatosplenomegaly noted.  Neuro: No focal neurological deficits noted cranial nerves II through XII grossly intact.  Strength at functional baseline in bilateral upper and lower extremities. Musculoskeletal: No warmth swelling or erythema around joints, no spinal tenderness noted. Psychiatric: Patient alert and oriented x3, good insight and cognition, good recent to remote recall.     Data Reviewed: Basic Metabolic Panel:  Recent Labs Lab 06/18/17 0756 06/20/17 0010 06/20/17 1511  NA 141 139 138  K 3.6 3.1* 3.3*  CL 108 105 105  CO2 24 25 23   GLUCOSE 94 91 87  BUN 7 8 <5*  CREATININE 0.55 0.63 0.57  CALCIUM 9.7 9.8 9.5   Liver Function Tests:  Recent Labs Lab 06/18/17 0756 06/20/17 0010 06/20/17 1511  AST 20 23 22   ALT 15 15 11*  ALKPHOS 58 61 60  BILITOT 3.3* 3.2* 5.5*  PROT 8.2* 8.0 8.1  ALBUMIN 4.4 4.3 4.3   No results for input(s): LIPASE, AMYLASE in the last 168 hours. No results for input(s): AMMONIA in the last 168 hours. CBC:  Recent Labs Lab 06/16/17 0545 06/18/17 0756 06/20/17 0010 06/20/17 1511  WBC 8.2  8.1 9.2 16.8*  NEUTROABS 3.6 3.8 3.4 13.9*  HGB 8.9* 9.5* 9.3* 9.1*  HCT 25.8* 26.8* 26.2* 25.8*  MCV 81.9 81.2 81.1 80.6  PLT 746* 618* 560* 551*   Cardiac Enzymes: No results for input(s): CKTOTAL, CKMB, CKMBINDEX, TROPONINI in the last 168 hours. BNP (last 3 results) No results for input(s): BNP in the last 8760 hours.  ProBNP (last 3 results) No results for input(s): PROBNP in the last 8760 hours.  CBG: No results for input(s): GLUCAP in the last 168 hours.  Recent Results (from the past 240 hour(s))  Rapid strep screen     Status: None   Collection Time: 06/20/17  8:38 PM  Result Value Ref Range Status   Streptococcus, Group A Screen (Direct) NEGATIVE NEGATIVE Final    Comment: (NOTE) A Rapid Antigen  test may result negative if the antigen level in the sample is below the detection level of this test. The FDA has not cleared this test as a stand-alone test therefore the rapid antigen negative result has reflexed to a Group A Strep culture.   Culture, group A strep     Status: None (Preliminary result)   Collection Time: 06/20/17  8:38 PM  Result Value Ref Range Status   Specimen Description THROAT  Final   Special Requests NONE Reflexed from [id number]  Final   Culture MODERATE STREPTOCOCCUS,BETA HEMOLYTIC NOT GROUP A  Final   Report Status PENDING  Incomplete  Rapid strep screen (not at [facility])     Status: None   Collection Time: 06/21/17  4:45 PM  Result Value Ref Range Status   Streptococcus, Group A Screen (Direct) NEGATIVE NEGATIVE Final    Comment: (NOTE) A Rapid Antigen test may result negative if the antigen level in the sample is below the detection level of this test. The FDA has not cleared this test as a stand-alone test therefore the rapid antigen negative result has reflexed to a Group A Strep culture.   Culture, group A strep     Status: None (Preliminary result)   Collection Time: 06/21/17  4:45 PM  Result Value Ref Range Status   Specimen Description THROAT  Final   Special Requests NONE Reflexed from [id number]  Final   Culture   Final    CULTURE REINCUBATED FOR BETTER GROWTH Performed at [facility], [place] [address], [place], [place] [address]    Report Status PENDING  Incomplete  Culture, expectorated sputum-assessment     Status: None   Collection Time: 06/22/17  2:29 AM  Result Value Ref Range Status   Specimen Description SPUTUM  Final   Special Requests Normal  Final   Sputum evaluation THIS SPECIMEN IS ACCEPTABLE FOR SPUTUM CULTURE  Final   Report Status 06/22/2017 FINAL  Final  Culture, respiratory (NON-Expectorated)     Status: None (Preliminary result)   Collection Time: 06/22/17  2:29 AM  Result Value Ref Range Status   Specimen Description  SPUTUM  Final   Special Requests Normal Reflexed from [id number]  Final   Gram Stain   Final    ABUNDANT WBC PRESENT, PREDOMINANTLY PMN ABUNDANT GRAM POSITIVE COCCI IN CHAINS MODERATE GRAM POSITIVE COCCI IN CLUSTERS MODERATE GRAM NEGATIVE RODS Performed at [facility], [address] [place], [place] [address]    Culture PENDING  Incomplete   Report Status PENDING  Incomplete     Studies: Dg Chest 2 View  Result Date: 06/20/2017 CLINICAL DATA:  Cough congestion and fever. Patient  with sickle cell disease. EXAM: CHEST  2 VIEW COMPARISON:  05/28/2017 FINDINGS: Upper limits normal heart size noted. There is no evidence of focal airspace disease, pulmonary edema, suspicious pulmonary nodule/mass, pleural effusion, or pneumothorax. No acute bony abnormalities are identified. IMPRESSION: Upper limits normal heart size without evidence of acute cardiopulmonary disease. Electronically Signed   By: Margarette Canada M.D.   On: 06/20/2017 15:33   Dg Chest 2 View  Result Date: 05/29/2017 CLINICAL DATA:  Left-sided chest pain.  Sickle cell disease. EXAM: CHEST  2 VIEW COMPARISON:  05/22/2017 FINDINGS: The lungs are clear. The pulmonary vasculature is normal. Heart size is normal. Hilar and mediastinal contours are unremarkable. There is no pleural effusion. IMPRESSION: No active cardiopulmonary disease. Electronically Signed   By: Andreas Newport M.D.   On: 05/29/2017 00:02    Scheduled Meds: . aspirin EC  81 mg Oral Daily  . enoxaparin (LOVENOX) injection  40 mg Subcutaneous Daily  . HYDROmorphone   Intravenous Q4H  . ketorolac  30 mg Intravenous Q6H  . morphine  60 mg Oral Q12H  . senna-docusate  1 tablet Oral BID   Continuous Infusions: . dextrose 5 % and 0.45% NaCl 1,000 mL (06/22/17 1340)  . 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 Greater  than 50% involved face to face contact with the patient for assessment, counseling and coordination of care.

## 2017-06-23 DIAGNOSIS — K5900 Constipation, unspecified: Secondary | ICD-10-CM

## 2017-06-23 LAB — CBC WITH DIFFERENTIAL/PLATELET
BASOS PCT: 0 %
Basophils Absolute: 0 10*3/uL (ref 0.0–0.1)
EOS ABS: 0.4 10*3/uL (ref 0.0–0.7)
Eosinophils Relative: 3 %
HEMATOCRIT: 23.1 % — AB (ref 36.0–46.0)
HEMOGLOBIN: 8.3 g/dL — AB (ref 12.0–15.0)
Lymphocytes Relative: 34 %
Lymphs Abs: 4 10*3/uL (ref 0.7–4.0)
MCH: 28.6 pg (ref 26.0–34.0)
MCHC: 35.9 g/dL (ref 30.0–36.0)
MCV: 79.7 fL (ref 78.0–100.0)
MONOS PCT: 16 %
Monocytes Absolute: 1.9 10*3/uL — ABNORMAL HIGH (ref 0.1–1.0)
NEUTROS PCT: 47 %
Neutro Abs: 5.4 10*3/uL (ref 1.7–7.7)
Platelets: 428 10*3/uL — ABNORMAL HIGH (ref 150–400)
RBC: 2.9 MIL/uL — ABNORMAL LOW (ref 3.87–5.11)
RDW: 17.1 % — ABNORMAL HIGH (ref 11.5–15.5)
WBC: 11.7 10*3/uL — ABNORMAL HIGH (ref 4.0–10.5)

## 2017-06-23 LAB — CULTURE, GROUP A STREP (THRC)

## 2017-06-23 LAB — RETICULOCYTES
RBC.: 2.9 MIL/uL — AB (ref 3.87–5.11)
RETIC CT PCT: 11.2 % — AB (ref 0.4–3.1)
Retic Count, Absolute: 67.2 10*3/uL (ref 19.0–186.0)

## 2017-06-23 MED ORDER — HYDROMORPHONE 1 MG/ML IV SOLN
INTRAVENOUS | Status: DC
  Administered 2017-06-23: 6.5 mg via INTRAVENOUS
  Administered 2017-06-23: 5 mg via INTRAVENOUS
  Administered 2017-06-23: 6.5 mg via INTRAVENOUS
  Administered 2017-06-24: 1 mg via INTRAVENOUS
  Administered 2017-06-24: 5 mg via INTRAVENOUS
  Administered 2017-06-24: 4.5 mg via INTRAVENOUS
  Administered 2017-06-24: 2.5 mg via INTRAVENOUS
  Administered 2017-06-24 (×2): 1 mg via INTRAVENOUS
  Filled 2017-06-23: qty 25

## 2017-06-23 MED ORDER — MAGNESIUM CITRATE PO SOLN
1.0000 | Freq: Once | ORAL | Status: AC
  Administered 2017-06-23: 1 via ORAL
  Filled 2017-06-23: qty 296

## 2017-06-23 MED ORDER — OXYCODONE HCL 5 MG PO TABS
10.0000 mg | ORAL_TABLET | ORAL | Status: DC
  Administered 2017-06-23 – 2017-06-24 (×6): 10 mg via ORAL
  Filled 2017-06-23 (×7): qty 2

## 2017-06-23 NOTE — Progress Notes (Signed)
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 Pharyngitis:Improved. Repeat swab negative for Group A Strept and culture is negative for group A strept.  2.  Hb SS with crisis: Schedule Oxycodone andcContinue PCA. Decrease frequency of PRN Dilaudid doses. Continue Toradol and decrease IVF to [facility].  3. Constipation: Continue Senna-S and give Magnesium Citrate at patients request.  4. Chronic Pain Syndrome: Continue MS Contin 5. Fever: Resolved.    Code Status: Full Code Family Communication: N/A Disposition Plan: Not yet ready for discharge  [place].  Pager [phone]. If 7PM-7AM, please contact night-coverage.  06/23/2017, 2:52 PM  LOS: 2 days   Interim History: Pt reports that  pain is still  9/10 and localized to low back, arms, legs and ankles. She has used 35 mg of Dilaudid with 104/62:demands/deliveries on the PCA in the last 24 hours. Reports that she has not had a BM since admission.    Consultants:  None  Procedures:  None  Antibiotics:  None    Objective: Vitals:   06/23/17 0454 06/23/17 0739 06/23/17 0934 06/23/17 1123  BP: 117/82  119/79   Pulse: 60  (!) 57   Resp: 11 (!) 8 10 10   Temp: 98.2 F (36.8 C)  98.2 F (36.8 C)   TempSrc: Oral  Oral   SpO2: 97% 100% 100% 100%  Weight:      Height:       Weight change:  No intake or output data in the 24 hours ending 06/23/17 1452   Physical Exam General: Alert, awake, oriented x3, in no apparent distress.  HEENT: [place]/AT PEERL, EOMI, anicteric Heart: Regular rate and rhythm, without murmurs, rubs, gallops, PMI non-displaced, no heaves or thrills on palpation.  Lungs: Clear to auscultation, no wheezing or rhonchi noted. No increased vocal fremitus resonant to percussion  Abdomen: Soft, nontender, nondistended, positive  bowel sounds, no masses no hepatosplenomegaly noted.  Neuro: No focal neurological deficits noted cranial nerves II through XII grossly intact.  Strength at functional baseline in bilateral upper and lower extremities. Musculoskeletal: No warmth swelling or erythema around joints, no spinal tenderness noted. Psychiatric: Patient alert and oriented x3, good insight and cognition, good recent to remote recall.     Data Reviewed: Basic Metabolic Panel:  Recent Labs Lab 06/18/17 0756 06/20/17 0010 06/20/17 1511  NA 141 139 138  K 3.6 3.1* 3.3*  CL 108 105 105  CO2 24 25 23   GLUCOSE 94 91 87  BUN 7 8 <5*  CREATININE 0.55 0.63 0.57  CALCIUM 9.7 9.8 9.5   Liver Function Tests:  Recent Labs Lab 06/18/17 0756 06/20/17 0010 06/20/17 1511  AST 20 23 22   ALT 15 15 11*  ALKPHOS 58 61 60  BILITOT 3.3* 3.2* 5.5*  PROT 8.2* 8.0 8.1  ALBUMIN 4.4 4.3 4.3   No results for input(s): LIPASE, AMYLASE in the last 168 hours. No results for input(s): AMMONIA in the last 168 hours. CBC:  Recent Labs Lab 06/18/17 0756 06/20/17 0010 06/20/17 1511 06/23/17 1034  WBC 8.1 9.2 16.8* 11.7*  NEUTROABS 3.8 3.4 13.9* 5.4  HGB 9.5* 9.3* 9.1* 8.3*  HCT 26.8* 26.2* 25.8* 23.1*  MCV 81.2 81.1 80.6 79.7  PLT 618* 560* 551* 428*   Cardiac Enzymes: No results for input(s): CKTOTAL, CKMB, CKMBINDEX, TROPONINI in the last 168 hours. BNP (last  3 results) No results for input(s): BNP in the last 8760 hours.  ProBNP (last 3 results) No results for input(s): PROBNP in the last 8760 hours.  CBG: No results for input(s): GLUCAP in the last 168 hours.  Recent Results (from the past 240 hour(s))  Rapid strep screen     Status: None   Collection Time: 06/20/17  8:38 PM  Result Value Ref Range Status   Streptococcus, Group A Screen (Direct) NEGATIVE NEGATIVE Final    Comment: (NOTE) A Rapid Antigen test may result negative if the antigen level in the sample is below the detection level of this  test. The FDA has not cleared this test as a stand-alone test therefore the rapid antigen negative result has reflexed to a Group A Strep culture.   Culture, group A strep     Status: None   Collection Time: 06/20/17  8:38 PM  Result Value Ref Range Status   Specimen Description THROAT  Final   Special Requests NONE Reflexed from [id number]  Final   Culture MODERATE STREPTOCOCCUS,BETA HEMOLYTIC NOT GROUP A  Final   Report Status 06/23/2017 FINAL  Final  Rapid strep screen (not at [facility])     Status: None   Collection Time: 06/21/17  4:45 PM  Result Value Ref Range Status   Streptococcus, Group A Screen (Direct) NEGATIVE NEGATIVE Final    Comment: (NOTE) A Rapid Antigen test may result negative if the antigen level in the sample is below the detection level of this test. The FDA has not cleared this test as a stand-alone test therefore the rapid antigen negative result has reflexed to a Group A Strep culture.   Culture, group A strep     Status: None   Collection Time: 06/21/17  4:45 PM  Result Value Ref Range Status   Specimen Description THROAT  Final   Special Requests NONE Reflexed from [id number]  Final   Culture MODERATE STREPTOCOCCUS,BETA HEMOLYTIC NOT GROUP A  Final   Report Status 06/23/2017 FINAL  Final  Culture, expectorated sputum-assessment     Status: None   Collection Time: 06/22/17  2:29 AM  Result Value Ref Range Status   Specimen Description SPUTUM  Final   Special Requests Normal  Final   Sputum evaluation THIS SPECIMEN IS ACCEPTABLE FOR SPUTUM CULTURE  Final   Report Status 06/22/2017 FINAL  Final  Culture, respiratory (NON-Expectorated)     Status: None (Preliminary result)   Collection Time: 06/22/17  2:29 AM  Result Value Ref Range Status   Specimen Description SPUTUM  Final   Special Requests Normal Reflexed from [id number]  Final   Gram Stain   Final    ABUNDANT WBC PRESENT, PREDOMINANTLY PMN ABUNDANT GRAM POSITIVE COCCI IN CHAINS MODERATE GRAM POSITIVE COCCI IN  CLUSTERS MODERATE GRAM NEGATIVE RODS    Culture   Final    CULTURE REINCUBATED FOR BETTER GROWTH Performed at [facility], [place] [address], [place], [place] [address]    Report Status PENDING  Incomplete     Studies: Dg Chest 2 View  Result Date: 06/20/2017 CLINICAL DATA:  Cough congestion and fever. Patient with sickle cell disease. EXAM: CHEST  2 VIEW COMPARISON:  05/28/2017 FINDINGS: Upper limits normal heart size noted. There is no evidence of focal airspace disease, pulmonary edema, suspicious pulmonary nodule/mass, pleural effusion, or pneumothorax. No acute bony abnormalities are identified. IMPRESSION: Upper limits normal heart size without evidence of acute cardiopulmonary disease. Electronically Signed   By: Margarette Canada  M.D.   On: 06/20/2017 15:33   Dg Chest 2 View  Result Date: 05/29/2017 CLINICAL DATA:  Left-sided chest pain.  Sickle cell disease. EXAM: CHEST  2 VIEW COMPARISON:  05/22/2017 FINDINGS: The lungs are clear. The pulmonary vasculature is normal. Heart size is normal. Hilar and mediastinal contours are unremarkable. There is no pleural effusion. IMPRESSION: No active cardiopulmonary disease. Electronically Signed   By: Andreas Newport M.D.   On: 05/29/2017 00:02    Scheduled Meds: . aspirin EC  81 mg Oral Daily  . enoxaparin (LOVENOX) injection  40 mg Subcutaneous Daily  . HYDROmorphone   Intravenous Q4H  . ketorolac  30 mg Intravenous Q6H  . magnesium citrate  1 Bottle Oral Once  . morphine  60 mg Oral Q12H  . oxyCODONE  10 mg Oral Q4H  . senna-docusate  1 tablet Oral BID   Continuous Infusions: . dextrose 5 % and 0.45% NaCl 50 mL/hr at 06/22/17 1728  . diphenhydrAMINE (BENADRYL) IVPB(SICKLE CELL ONLY)      Principal Problem:   Sickle cell pain crisis ([place]) Active Problems:   Fever    In excess of 25 minutes spent during this visit. Greater than 50% involved face to face contact with the patient for assessment, counseling and coordination of  care.

## 2017-06-24 LAB — CULTURE, RESPIRATORY W GRAM STAIN: Culture: NORMAL

## 2017-06-24 LAB — CULTURE, RESPIRATORY: SPECIAL REQUESTS: NORMAL

## 2017-06-24 NOTE — Progress Notes (Signed)
Pt called RN to room and stated that she received a call from security at [facility] stating her car was about to be towed.  Pt states she is unable reach any family to go and retrieve her vehicle. Pt requested to sign herself out AMA. MD on call notified.

## 2017-06-25 ENCOUNTER — Encounter (HOSPITAL_COMMUNITY): Payer: Self-pay | Admitting: Nurse Practitioner

## 2017-06-25 ENCOUNTER — Observation Stay (HOSPITAL_COMMUNITY)
Admission: EM | Admit: 2017-06-25 | Discharge: 2017-06-25 | Disposition: A | Discharge status: Home / Self Care | Payer: Medicaid Other | Attending: Internal Medicine | Admitting: Internal Medicine

## 2017-06-25 DIAGNOSIS — R070 Pain in throat: Secondary | ICD-10-CM | POA: Diagnosis present

## 2017-06-25 DIAGNOSIS — Z7982 Long term (current) use of aspirin: Secondary | ICD-10-CM | POA: Diagnosis not present

## 2017-06-25 DIAGNOSIS — D5700 Hb-SS disease with crisis, unspecified: Secondary | ICD-10-CM | POA: Diagnosis not present

## 2017-06-25 DIAGNOSIS — F609 Personality disorder, unspecified: Secondary | ICD-10-CM

## 2017-06-25 DIAGNOSIS — J029 Acute pharyngitis, unspecified: Secondary | ICD-10-CM

## 2017-06-25 DIAGNOSIS — D57219 Sickle-cell/Hb-C disease with crisis, unspecified: Principal | ICD-10-CM | POA: Insufficient documentation

## 2017-06-25 DIAGNOSIS — M791 Myalgia: Secondary | ICD-10-CM | POA: Insufficient documentation

## 2017-06-25 DIAGNOSIS — F449 Dissociative and conversion disorder, unspecified: Secondary | ICD-10-CM | POA: Diagnosis not present

## 2017-06-25 DIAGNOSIS — J069 Acute upper respiratory infection, unspecified: Secondary | ICD-10-CM | POA: Diagnosis not present

## 2017-06-25 DIAGNOSIS — G894 Chronic pain syndrome: Secondary | ICD-10-CM | POA: Diagnosis not present

## 2017-06-25 LAB — BASIC METABOLIC PANEL
Anion gap: 12 (ref 5–15)
CHLORIDE: 101 mmol/L (ref 101–111)
CO2: 24 mmol/L (ref 22–32)
CREATININE: 0.34 mg/dL — AB (ref 0.44–1.00)
Calcium: 9.1 mg/dL (ref 8.9–10.3)
GFR calc Af Amer: >60 mL/min (ref >60)
GFR calc non Af Amer: >60 mL/min (ref >60)
Glucose, Bld: 72 mg/dL (ref 65–99)
Potassium: 4.8 mmol/L (ref 3.5–5.1)
SODIUM: 137 mmol/L (ref 135–145)

## 2017-06-25 LAB — CBC
HCT: 23.5 % — ABNORMAL LOW (ref 36.0–46.0)
Hemoglobin: 8.5 g/dL — ABNORMAL LOW (ref 12.0–15.0)
MCH: 29.3 pg (ref 26.0–34.0)
MCHC: 36.2 g/dL — ABNORMAL HIGH (ref 30.0–36.0)
MCV: 81 fL (ref 78.0–100.0)
PLATELETS: 529 10*3/uL — AB (ref 150–400)
RBC: 2.9 MIL/uL — AB (ref 3.87–5.11)
RDW: 17.6 % — ABNORMAL HIGH (ref 11.5–15.5)
WBC: 11.6 10*3/uL — AB (ref 4.0–10.5)

## 2017-06-25 LAB — RETICULOCYTES
RBC.: 2.9 MIL/uL — AB (ref 3.87–5.11)
RETIC COUNT ABSOLUTE: 356.7 10*3/uL — AB (ref 19.0–186.0)
RETIC CT PCT: 12.3 % — AB (ref 0.4–3.1)

## 2017-06-25 MED ORDER — NALOXONE HCL 0.4 MG/ML IJ SOLN: 0.4000 mg | INTRAMUSCULAR | Status: DC | PRN

## 2017-06-25 MED ORDER — HYDROMORPHONE 1 MG/ML IV SOLN
INTRAVENOUS | Status: DC
  Administered 2017-06-25: 14:00:00 via INTRAVENOUS
  Administered 2017-06-25: 2.5 mg via INTRAVENOUS
  Filled 2017-06-25: qty 25

## 2017-06-25 MED ORDER — HYDROMORPHONE HCL 1 MG/ML IJ SOLN
2.0000 mg | INTRAMUSCULAR | Status: AC
  Administered 2017-06-25 (×2): 2 mg via INTRAVENOUS
  Filled 2017-06-25 (×2): qty 2

## 2017-06-25 MED ORDER — HYDROMORPHONE HCL 1 MG/ML IJ SOLN
2.0000 mg | INTRAMUSCULAR | Status: AC
  Administered 2017-06-25: 2 mg via INTRAVENOUS
  Filled 2017-06-25: qty 2

## 2017-06-25 MED ORDER — DIPHENHYDRAMINE HCL 50 MG/ML IJ SOLN
25.0000 mg | Freq: Once | INTRAMUSCULAR | Status: AC
  Administered 2017-06-25: 25 mg via INTRAVENOUS
  Filled 2017-06-25: qty 1

## 2017-06-25 MED ORDER — POLYETHYLENE GLYCOL 3350 17 G PO PACK: 17.0000 g | PACK | Freq: Every day | ORAL | Status: DC | PRN

## 2017-06-25 MED ORDER — ONDANSETRON HCL 4 MG/2ML IJ SOLN: 4.0000 mg | Freq: Four times a day (QID) | INTRAMUSCULAR | Status: DC | PRN

## 2017-06-25 MED ORDER — ENOXAPARIN SODIUM 40 MG/0.4ML ~~LOC~~ SOLN
40.0000 mg | SUBCUTANEOUS | Status: DC
Start: 2017-06-25 — End: 2017-06-25

## 2017-06-25 MED ORDER — SODIUM CHLORIDE 0.9 % IV SOLN
25.0000 mg | INTRAVENOUS | Status: DC | PRN
  Filled 2017-06-25: qty 0.5

## 2017-06-25 MED ORDER — HYDROMORPHONE HCL 1 MG/ML IJ SOLN
2.0000 mg | INTRAMUSCULAR | Status: AC
  Filled 2017-06-25: qty 2

## 2017-06-25 MED ORDER — KETOROLAC TROMETHAMINE 15 MG/ML IJ SOLN
15.0000 mg | INTRAMUSCULAR | Status: AC
  Administered 2017-06-25: 15 mg via INTRAVENOUS
  Filled 2017-06-25: qty 1

## 2017-06-25 MED ORDER — ASPIRIN EC 81 MG PO TBEC
81.0000 mg | DELAYED_RELEASE_TABLET | Freq: Every day | ORAL | Status: DC
  Administered 2017-06-25: 81 mg via ORAL
  Filled 2017-06-25: qty 1

## 2017-06-25 MED ORDER — HYDROMORPHONE HCL 1 MG/ML IJ SOLN: 2.0000 mg | INTRAMUSCULAR | Status: AC

## 2017-06-25 MED ORDER — MORPHINE SULFATE ER 30 MG PO TBCR: 60.0000 mg | EXTENDED_RELEASE_TABLET | Freq: Two times a day (BID) | ORAL | Status: DC

## 2017-06-25 MED ORDER — DIPHENHYDRAMINE HCL 25 MG PO CAPS: 25.0000 mg | ORAL_CAPSULE | ORAL | Status: DC | PRN

## 2017-06-25 MED ORDER — SODIUM CHLORIDE 0.9 % IV SOLN
INTRAVENOUS | Status: DC
  Administered 2017-06-25: 15:00:00 via INTRAVENOUS

## 2017-06-25 MED ORDER — ALPRAZOLAM 0.25 MG PO TABS
0.2500 mg | ORAL_TABLET | Freq: Once | ORAL | Status: AC
  Administered 2017-06-25: 0.25 mg via ORAL
  Filled 2017-06-25: qty 1

## 2017-06-25 MED ORDER — HYDROMORPHONE HCL 1 MG/ML IJ SOLN
2.0000 mg | INTRAMUSCULAR | Status: AC
  Administered 2017-06-25: 2 mg via INTRAVENOUS

## 2017-06-25 MED ORDER — SENNOSIDES-DOCUSATE SODIUM 8.6-50 MG PO TABS: 1.0000 | ORAL_TABLET | Freq: Two times a day (BID) | ORAL | Status: DC

## 2017-06-25 MED ORDER — SODIUM CHLORIDE 0.9% FLUSH: 9.0000 mL | INTRAVENOUS | Status: DC | PRN

## 2017-06-25 MED ORDER — KETOROLAC TROMETHAMINE 30 MG/ML IJ SOLN
30.0000 mg | Freq: Four times a day (QID) | INTRAMUSCULAR | Status: DC
  Administered 2017-06-25: 30 mg via INTRAVENOUS
  Filled 2017-06-25: qty 1

## 2017-06-25 MED ORDER — OXYCODONE HCL 5 MG PO TABS
10.0000 mg | ORAL_TABLET | ORAL | Status: DC
  Administered 2017-06-25: 10 mg via ORAL
  Filled 2017-06-25: qty 2

## 2017-06-25 NOTE — Progress Notes (Signed)
At shift change during report, pt in tears stating she has to sign herself out because her fiance has been in a bad car accident. Pt signed AMA form, IV removed, dilaudid PCA syringe wasted with Adonis Brook. AC and on call MD made aware of pts departure. Hortencia Conradi RN

## 2017-06-25 NOTE — ED Provider Notes (Signed)
[facility] DEPT Provider Note   CSN: [id number] Arrival date & time: 06/25/17  0006     History   Chief Complaint Chief Complaint  Patient presents with  . Sickle Cell Pain Crisis  . Sore Throat    HPI Carmen [name] is a 26 y.o. female.  Carmen [name] is a 26 y.o. 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 She was admitted on 8/21 for sickle cell crisis, but left AMA because she was called and told her car was being towed. She reports she continues to have generalized pain and myalgias, especially in her back, and this pain in consistent for previous sickle cell crises, and feels she is not back at her baseline. She reports her sore throat has improved from earlier this week, denies fever, is able to swallow, take in fluids and tolerate secretions. Reports associated rhinorrhea and cough, denies ear pain or sinus pressure. CXR from previous admission showed no focal infiltrates. Denies chest pain or shortness of breath.      Past Medical History:  Diagnosis Date  . Abortion    05/2012  . Headache(784.0)   . Sickle cell crisis [facility])     Patient Active Problem List   Diagnosis Date Noted  . Sickle cell pain crisis ([place]) 06/21/2017  . Fever 06/21/2017  . 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 Anemia of chronic disease   . S/P laparoscopic cholecystectomy 11/30/2014  . Thrombocytosis ([place]) 11/22/2014  . Systolic ejection murmur 83/38/2505    Past Surgical History:  Procedure Laterality Date  . CHOLECYSTECTOMY N/A 11/30/2014   Procedure: LAPAROSCOPIC CHOLECYSTECTOMY SINGLE SITE WITH INTRAOPERATIVE CHOLANGIOGRAM;  Surgeon: Michael Boston, MD;  Location: [facility] ORS;  Service: General;  Laterality: N/A;  . SPLENECTOMY      OB History    Gravida Para Term Preterm AB Living   1       1     SAB TAB Ectopic Multiple Live Births                   Home Medications     Prior to Admission medications   Medication Sig Start Date End Date Taking? Authorizing Provider  aspirin EC 81 MG tablet Take 1 tablet (81 mg total) by mouth daily. 05/31/17   Leana Gamer, MD  morphine (MS CONTIN) 60 MG 12 hr tablet Take 1 tablet (60 mg total) by mouth every 12 (twelve) hours. 05/31/17   Leana Gamer, MD  oxyCODONE-acetaminophen (PERCOCET) 10-325 MG tablet Take 1 tablet by mouth every 4 (four) hours as needed for severe pain. 05/09/17   [provider]    Family History Family History  Problem Relation Age of Onset  . Hypertension Mother   . Sickle cell anemia Sister   . Kidney disease Sister        Lupus  . Arthritis Sister   . Sickle cell anemia Sister   . Sickle cell trait Sister   . Heart disease Maternal Aunt        CABG  . Heart disease Maternal Uncle        CABG  . Lupus Sister     Social History Social History  Substance Use Topics  . Smoking status: Never Smoker  . Smokeless tobacco: Never Used  . Alcohol use No     Allergies   Patient has no known allergies.  Review of Systems Review of Systems  Constitutional: Negative for chills and fever.  HENT: Positive for rhinorrhea and sore throat. Negative for ear pain and sinus pressure.   Eyes: Negative for photophobia and visual disturbance.  Respiratory: Negative for cough, chest tightness and shortness of breath.   Cardiovascular: Negative for chest pain, palpitations and leg swelling.  Gastrointestinal: Negative for abdominal distention, abdominal pain, diarrhea, nausea and vomiting.  Genitourinary: Negative for difficulty urinating and dyspareunia.  Musculoskeletal: Positive for back pain and myalgias.  Skin: Negative for rash and wound.  Neurological: Negative for dizziness, facial asymmetry, weakness, light-headedness, numbness and headaches.     Physical Exam Updated Vital Signs BP 132/79 (BP Location: Left Arm)   Pulse (!) 116   Temp 98.7 F (37.1 C)  (Oral)   Resp 14   LMP 06/05/2017   SpO2 96%   Physical Exam  Constitutional: She appears well-developed and well-nourished. No distress.  HENT:  Head: Normocephalic and atraumatic.  Right Ear: Tympanic membrane and external ear normal.  Left Ear: Tympanic membrane and external ear normal.  Nose: Mucosal edema present.  Mouth/Throat: Oropharynx is clear and moist. No oropharyngeal exudate, posterior oropharyngeal edema or posterior oropharyngeal erythema.  Eyes: Pupils are equal, round, and reactive to light. EOM are normal. Right eye exhibits no discharge. Left eye exhibits no discharge.  Neck: Neck supple.  Cardiovascular: Regular rhythm, normal heart sounds and intact distal pulses.   Tachycardia  Pulmonary/Chest: Effort normal and breath sounds normal. No respiratory distress. She has no wheezes. She has no rales.  Abdominal: Soft. Bowel sounds are normal. She exhibits no distension. There is no tenderness. There is no guarding.  Musculoskeletal: She exhibits no edema or deformity.  Lymphadenopathy:    She has no cervical adenopathy.  Neurological: She is alert. Coordination normal.  Speech is clear, able to follow commands CN III-XII intact Normal strength in upper and lower extremities bilaterally including dorsiflexion and plantar flexion, strong and equal grip strength Sensation normal to light and sharp touch Moves extremities without ataxia, coordination intact  Skin: Skin is warm and dry. She is not diaphoretic.  Psychiatric: She has a normal mood and affect. Her behavior is normal.  Nursing note and vitals reviewed.    ED Treatments / Results  Labs (all labs ordered are listed, but only abnormal results are displayed) Labs Reviewed  CBC - Abnormal; Notable for the following:       Result Value   WBC 11.6 (*)    RBC 2.90 (*)    Hemoglobin 8.5 (*)    HCT 23.5 (*)    MCHC 36.2 (*)    RDW 17.6 (*)    Platelets 529 (*)    All other components within normal limits   RETICULOCYTES - Abnormal; Notable for the following:    Retic Ct Pct 12.3 (*)    RBC. 2.90 (*)    Retic Count, Absolute 356.7 (*)    All other components within normal limits  BASIC METABOLIC PANEL - Abnormal; Notable for the following:    BUN <5 (*)    Creatinine, Ser 0.34 (*)    All other components within normal limits    EKG  EKG Interpretation None       Radiology No results found.  Procedures Procedures (including critical care time)  Medications Ordered in ED Medications  aspirin EC tablet 81 mg (not administered)  morphine (MS CONTIN) 12 hr tablet 60 mg (not administered)  senna-docusate (Senokot-S) tablet 1 tablet (not  administered)  polyethylene glycol (MIRALAX / GLYCOLAX) packet 17 g (not administered)  enoxaparin (LOVENOX) injection 40 mg (not administered)  ketorolac (TORADOL) 30 MG/ML injection 30 mg (not administered)  naloxone (NARCAN) injection 0.4 mg (not administered)    And  sodium chloride flush (NS) 0.9 % injection 9 mL (not administered)  ondansetron (ZOFRAN) injection 4 mg (not administered)  diphenhydrAMINE (BENADRYL) capsule 25-50 mg (not administered)    Or  diphenhydrAMINE (BENADRYL) 25 mg in sodium chloride 0.9 % 50 mL IVPB (not administered)  HYDROmorphone (DILAUDID) 1 mg/mL PCA injection (2.5 mg Intravenous Received 06/25/17 1553)  oxyCODONE (Oxy IR/ROXICODONE) immediate release tablet 10 mg (10 mg Oral Given 06/25/17 1552)  0.9 %  sodium chloride infusion ( Intravenous New Bag/Given 06/25/17 1433)  HYDROmorphone (DILAUDID) injection 2 mg (2 mg Intravenous Given 06/25/17 0844)    Or  HYDROmorphone (DILAUDID) injection 2 mg ( Subcutaneous See Alternative 06/25/17 0844)  HYDROmorphone (DILAUDID) injection 2 mg (2 mg Intravenous Given 06/25/17 0934)    Or  HYDROmorphone (DILAUDID) injection 2 mg ( Subcutaneous See Alternative 06/25/17 0934)  HYDROmorphone (DILAUDID) injection 2 mg (2 mg Intravenous Given 06/25/17 1005)    Or  HYDROmorphone  (DILAUDID) injection 2 mg ( Subcutaneous See Alternative 06/25/17 1005)  HYDROmorphone (DILAUDID) injection 2 mg (2 mg Intravenous Given 06/25/17 1321)    Or  HYDROmorphone (DILAUDID) injection 2 mg ( Subcutaneous See Alternative 06/25/17 1321)  ketorolac (TORADOL) 15 MG/ML injection 15 mg (15 mg Intravenous Given 06/25/17 0841)  diphenhydrAMINE (BENADRYL) injection 25 mg (25 mg Intravenous Given 06/25/17 0841)  ALPRAZolam (XANAX) tablet 0.25 mg (0.25 mg Oral Given 06/25/17 1329)     Initial Impression / Assessment and Plan / ED Course  I have reviewed the triage vital signs and the nursing notes.  Pertinent labs & imaging results that were available during my care of the patient were reviewed by me and considered in my medical decision making (see chart for details).  Patent returns after leaving AMA from admission for [place] pain crisis. Patient is transiently tachycardia likely due to pani and anxiety, other vitals stable. CBC, retic count and BMP ordered. Not complaining of chest pain or shortness of breath, CXR on admission was clear, not concerned for acute chest. No focal neurologic deficits. Will provide pain management and reassess.  On re-eval after 3 doses of pain medications patient reports pain is not improving. Labs show some improvement since hospital admission. URI is improving, not concerned for more severe infection. Patient will need to be admitted for further pain management. Patient discussed with Dr. Vanita Panda, who saw patient as well and agrees with plan.   Final Clinical Impressions(s) / ED Diagnoses   Final diagnoses:  Sickle cell pain crisis ([place])  Sore throat  Upper respiratory tract infection, unspecified type    New Prescriptions New Prescriptions   No medications on file     Janet Berlin 06/25/17 1632    Carmin Muskrat, MD 06/26/17 [phone]

## 2017-06-25 NOTE — ED Notes (Signed)
MD at bedside. 

## 2017-06-25 NOTE — ED Triage Notes (Signed)
Pt is c/o generalized SCC pain and sore throat, states she was hospitalized but had to sign herself out AMA to go take care of a car situation.

## 2017-06-25 NOTE — Progress Notes (Signed)
SICKLE CELL SERVICE PROGRESS NOTE  Carmen [name] WUJ:[id number] DOB: 10-01-1991 DOA: 06/20/2017 PCP: [name], MD  Assessment/Plan: Principal Problem:   Sickle cell pain crisis ([place]) Active Problems:   Fever  1. Hb SS with Crisis:Will start weaning PCA and continue Toradol at current doses and re-evaluate for possible discharge tomorrow if progress remains the same.  2. Pharyngitis:Improved.Cuktures show B-hemolytic strept not Group A. Continue to treat symptomatically.  3. Constipation: Pt had good results from Magnesium Citrate. Continue Senna-S. 4. Chronic Pain Syndrome: Continue MS Contin 5. Fever: Resolved.    Code Status: Full Code Family Communication: N/A Disposition Plan: Anticipate discharge tomorrow  MATTHEWS,MICHELLE A.  Pager [phone]. If 7PM-7AM, please contact night-coverage.  06/25/2017, 11:08 AM  LOS: 3 days   Interim History: Pt reports that she feels better today. She certainly looks more energetic and appears in less pain. Pain is at an intensity of 6/10 and localized to low back, arms, legs and ankles. She has used 28 mg of Dilaudid with 64/56:demands/deliveries on the PCA in the last 24 hours. Reports that she had multiple BM's today. Discussed possible discharge tomorrow.   Consultants:  None  Procedures:  None  Antibiotics:  None    Objective: Vitals:   06/24/17 1127 06/24/17 1357 06/24/17 1528 06/24/17 1720  BP:  117/66  120/63  Pulse:  70  68  Resp: 14 14 18 16   Temp:  98.4 F (36.9 C)  98.4 F (36.9 C)  TempSrc:  Oral  Oral  SpO2: 95% 99% 97% 98%  Weight:      Height:       Weight change:  No intake or output data in the 24 hours ending 06/25/17 1108   Physical Exam General: Alert, awake, oriented x3, in no apparent distress. Well appearing HEENT: [place]/AT PEERL, EOMI, anicteric Heart: Regular rate and rhythm, without murmurs, rubs, gallops, PMI non-displaced, no heaves or thrills on palpation.  Lungs: Clear to  auscultation, no wheezing or rhonchi noted. No increased vocal fremitus resonant to percussion  Abdomen: Soft, nontender, nondistended, positive bowel sounds, no masses no hepatosplenomegaly noted.  Neuro: No focal neurological deficits noted cranial nerves II through XII grossly intact.  Strength at functional baseline in bilateral upper and lower extremities. Musculoskeletal: No warmth swelling or erythema around joints, no spinal tenderness noted. Psychiatric: Patient alert and oriented x3, good insight and cognition, good recent to remote recall.     Data Reviewed: Basic Metabolic Panel:  Recent Labs Lab 06/20/17 0010 06/20/17 1511 06/25/17 0950  NA 139 138 137  K 3.1* 3.3* 4.8  CL 105 105 101  CO2 25 23 24   GLUCOSE 91 87 72  BUN 8 <5* <5*  CREATININE 0.63 0.57 0.34*  CALCIUM 9.8 9.5 9.1   Liver Function Tests:  Recent Labs Lab 06/20/17 0010 06/20/17 1511  AST 23 22  ALT 15 11*  ALKPHOS 61 60  BILITOT 3.2* 5.5*  PROT 8.0 8.1  ALBUMIN 4.3 4.3   No results for input(s): LIPASE, AMYLASE in the last 168 hours. No results for input(s): AMMONIA in the last 168 hours. CBC:  Recent Labs Lab 06/20/17 0010 06/20/17 1511 06/23/17 1034 06/25/17 0950  WBC 9.2 16.8* 11.7* 11.6*  NEUTROABS 3.4 13.9* 5.4  --   HGB 9.3* 9.1* 8.3* 8.5*  HCT 26.2* 25.8* 23.1* 23.5*  MCV 81.1 80.6 79.7 81.0  PLT 560* 551* 428* 529*   Cardiac Enzymes: No results for input(s): CKTOTAL, CKMB, CKMBINDEX, TROPONINI in the last 168 hours.  BNP (last 3 results) No results for input(s): BNP in the last 8760 hours.  ProBNP (last 3 results) No results for input(s): PROBNP in the last 8760 hours.  CBG: No results for input(s): GLUCAP in the last 168 hours.  Recent Results (from the past 240 hour(s))  Rapid strep screen     Status: None   Collection Time: 06/20/17  8:38 PM  Result Value Ref Range Status   Streptococcus, Group A Screen (Direct) NEGATIVE NEGATIVE Final    Comment: (NOTE) A  Rapid Antigen test may result negative if the antigen level in the sample is below the detection level of this test. The FDA has not cleared this test as a stand-alone test therefore the rapid antigen negative result has reflexed to a Group A Strep culture.   Culture, group A strep     Status: None   Collection Time: 06/20/17  8:38 PM  Result Value Ref Range Status   Specimen Description THROAT  Final   Special Requests NONE Reflexed from [id number]  Final   Culture MODERATE STREPTOCOCCUS,BETA HEMOLYTIC NOT GROUP A  Final   Report Status 06/23/2017 FINAL  Final  Rapid strep screen (not at [facility])     Status: None   Collection Time: 06/21/17  4:45 PM  Result Value Ref Range Status   Streptococcus, Group A Screen (Direct) NEGATIVE NEGATIVE Final    Comment: (NOTE) A Rapid Antigen test may result negative if the antigen level in the sample is below the detection level of this test. The FDA has not cleared this test as a stand-alone test therefore the rapid antigen negative result has reflexed to a Group A Strep culture.   Culture, group A strep     Status: None   Collection Time: 06/21/17  4:45 PM  Result Value Ref Range Status   Specimen Description THROAT  Final   Special Requests NONE Reflexed from [id number]  Final   Culture MODERATE STREPTOCOCCUS,BETA HEMOLYTIC NOT GROUP A  Final   Report Status 06/23/2017 FINAL  Final  Culture, expectorated sputum-assessment     Status: None   Collection Time: 06/22/17  2:29 AM  Result Value Ref Range Status   Specimen Description SPUTUM  Final   Special Requests Normal  Final   Sputum evaluation THIS SPECIMEN IS ACCEPTABLE FOR SPUTUM CULTURE  Final   Report Status 06/22/2017 FINAL  Final  Culture, respiratory (NON-Expectorated)     Status: None   Collection Time: 06/22/17  2:29 AM  Result Value Ref Range Status   Specimen Description SPUTUM  Final   Special Requests Normal Reflexed from [id number]  Final   Gram Stain   Final    ABUNDANT WBC PRESENT,  PREDOMINANTLY PMN ABUNDANT GRAM POSITIVE COCCI IN CHAINS MODERATE GRAM POSITIVE COCCI IN CLUSTERS MODERATE GRAM NEGATIVE RODS    Culture   Final    Consistent with normal respiratory flora. Performed at [facility], [place] [address] [place], [place] [address]    Report Status 06/24/2017 FINAL  Final     Studies: Dg Chest 2 View  Result Date: 06/20/2017 CLINICAL DATA:  Cough congestion and fever. Patient with sickle cell disease. EXAM: CHEST  2 VIEW COMPARISON:  05/28/2017 FINDINGS: Upper limits normal heart size noted. There is no evidence of focal airspace disease, pulmonary edema, suspicious pulmonary nodule/mass, pleural effusion, or pneumothorax. No acute bony abnormalities are identified. IMPRESSION: Upper limits normal heart size without evidence of acute cardiopulmonary disease. Electronically Signed   By: Dellis Filbert  Hu M.D.   On: 06/20/2017 15:33   Dg Chest 2 View  Result Date: 05/29/2017 CLINICAL DATA:  Left-sided chest pain.  Sickle cell disease. EXAM: CHEST  2 VIEW COMPARISON:  05/22/2017 FINDINGS: The lungs are clear. The pulmonary vasculature is normal. Heart size is normal. Hilar and mediastinal contours are unremarkable. There is no pleural effusion. IMPRESSION: No active cardiopulmonary disease. Electronically Signed   By: Andreas Newport M.D.   On: 05/29/2017 00:02    Scheduled Meds:  Continuous Infusions:   Principal Problem:   Sickle cell pain crisis ([place]) Active Problems:   Fever    In excess of 25 minutes spent during this visit. Greater than 50% involved face to face contact with the patient for assessment, counseling and coordination of care.

## 2017-06-25 NOTE — H&P (Signed)
Hospital Admission Note Date: 06/25/2017  Patient name: Carmen [name] Medical record number: [id number] Date of birth: [birth year] Age: 26 y.o. Gender: female PCP: [name], MD  Attending physician: Leana Gamer, MD  Chief Complaint:Pain in back, arms and legs x several days  History of Present Illness:This is an opiate tolerant patient with Hb SS who was hospitalized and with a planned discharge today. She left the hospital AMA yesterday because she parked her car at [facility] hospital when she decided to go to [facility] instead of [facility] and yesterday received a call from security requiring her to have her car moved. She states that the stress of the situation has re-escalated her pain. However patient is ambulatory without difficulty. She denies any fevers, trauma, vomiting, diarrhea, CP, headache, cough or SOB. This patient has a heavy psychotrophic overlay to her pain and has significant central sensitization. She has demonstrated that she is functional with the level of pain that she has as she was able to leave and drive her car to avoid towing. Additionally she demonstrates no new or increased medical risk over that which she had yesterday. The patient is insisting that she is having a prodrome and feels as though her condition will deteriorate. I question that the patient is in crisis so will bring her in on an observation basis.   She reports that she has pain all over at an intensity of 10/10. She has received 4 doses of Dilaudid and 1 dose of Toradol without any reported relief.   Scheduled Meds: .  HYDROmorphone (DILAUDID) injection  2 mg Intravenous Q1H   Or  .  HYDROmorphone (DILAUDID) injection  2 mg Subcutaneous Q1H   Continuous Infusions: PRN Meds:. Allergies: Patient has no known allergies. Past Medical History:  Diagnosis Date  . Abortion    05/2012  . Headache(784.0)   . Sickle cell crisis [facility])    Past Surgical History:  Procedure Laterality Date  . CHOLECYSTECTOMY  N/A 11/30/2014   Procedure: LAPAROSCOPIC CHOLECYSTECTOMY SINGLE SITE WITH INTRAOPERATIVE CHOLANGIOGRAM;  Surgeon: Michael Boston, MD;  Location: [facility] ORS;  Service: General;  Laterality: N/A;  . SPLENECTOMY     Family History  Problem Relation Age of Onset  . Hypertension Mother   . Sickle cell anemia Sister   . Kidney disease Sister        Lupus  . Arthritis Sister   . Sickle cell anemia Sister   . Sickle cell trait Sister   . Heart disease Maternal Aunt        CABG  . Heart disease Maternal Uncle        CABG  . Lupus Sister    Social History   Social History  . Marital status: Single    Spouse name: N/A  . Number of children: N/A  . Years of education: N/A   Occupational History  . Not on file.   Social History Main Topics  . Smoking status: Never Smoker  . Smokeless tobacco: Never Used  . Alcohol use No  . Drug use: No  . Sexual activity: Yes    Birth control/ protection: None   Other Topics Concern  . Not on file   Social History Narrative  . No narrative on file   Review of Systems: Pertinent items noted in HPI and remainder of comprehensive ROS otherwise negative.  Physical Exam: No intake or output data in the 24 hours ending 06/25/17 1254 General: Alert, awake, oriented x3, in no apparent physical distress. However  patient has worked herself up into a frenzy and is crying and behaving in a very dramatic manner.  HEENT: [place]/AT PEERL, EOMI, anicteric Neck: Trachea midline,  no masses, no thyromegal,y no JVD, no carotid bruit OROPHARYNX:  Moist, No exudate/ erythema/lesions.  Heart: Regular rate and rhythm, without murmurs, rubs, gallops, PMI non-displaced, no heaves or thrills on palpation.  Lungs: Clear to auscultation, no wheezing or rhonchi noted. No increased vocal fremitus resonant to percussion  Abdomen: Soft, nontender, nondistended, positive bowel sounds, no masses no hepatosplenomegaly noted.  Neuro: No focal neurological deficits noted cranial nerves  II through XII grossly intact.  Strength at baseline in bilateral upper and lower extremities. Musculoskeletal: No warmth swelling or erythema around joints, no spinal tenderness noted. Psychiatric: Patient alert and oriented x3, good insight and cognition, good recent to remote recall. Histrionic affect   Lab results:  Recent Labs  06/25/17 0950  NA 137  K 4.8  CL 101  CO2 24  GLUCOSE 72  BUN <5*  CREATININE 0.34*  CALCIUM 9.1   No results for input(s): AST, ALT, ALKPHOS, BILITOT, PROT, ALBUMIN in the last 72 hours. No results for input(s): LIPASE, AMYLASE in the last 72 hours.  Recent Labs  06/23/17 1034 06/25/17 0950  WBC 11.7* 11.6*  NEUTROABS 5.4  --   HGB 8.3* 8.5*  HCT 23.1* 23.5*  MCV 79.7 81.0  PLT 428* 529*   No results for input(s): CKTOTAL, CKMB, CKMBINDEX, TROPONINI in the last 72 hours. Invalid input(s): POCBNP No results for input(s): DDIMER in the last 72 hours. No results for input(s): HGBA1C in the last 72 hours. No results for input(s): CHOL, HDL, LDLCALC, TRIG, CHOLHDL, LDLDIRECT in the last 72 hours. No results for input(s): TSH, T4TOTAL, T3FREE, THYROIDAB in the last 72 hours.  Invalid input(s): [place]  06/23/17 1034 06/25/17 0950  RETICCTPCT 11.2* 12.3*   Imaging results:  Dg Chest 2 View  Result Date: 06/20/2017 CLINICAL DATA:  Cough congestion and fever. Patient with sickle cell disease. EXAM: CHEST  2 VIEW COMPARISON:  05/28/2017 FINDINGS: Upper limits normal heart size noted. There is no evidence of focal airspace disease, pulmonary edema, suspicious pulmonary nodule/mass, pleural effusion, or pneumothorax. No acute bony abnormalities are identified. IMPRESSION: Upper limits normal heart size without evidence of acute cardiopulmonary disease. Electronically Signed   By: Margarette Canada M.D.   On: 06/20/2017 15:33   Dg Chest 2 View  Result Date: 05/29/2017 CLINICAL DATA:  Left-sided chest pain.  Sickle cell disease. EXAM: CHEST   2 VIEW COMPARISON:  05/22/2017 FINDINGS: The lungs are clear. The pulmonary vasculature is normal. Heart size is normal. Hilar and mediastinal contours are unremarkable. There is no pleural effusion. IMPRESSION: No active cardiopulmonary disease. Electronically Signed   By: Andreas Newport M.D.   On: 05/29/2017 00:02     Assessment and Plan: 1. Hb SS with vaso-occlusive pain: Schedule Oxycodone 10 mg every 4 hour. Order Dilaudid PCA at previous settings. Toradol for 2 days only as she has just completed 3 days of Toradol. IVF at [facility]. Plan is to discharge tomorrow if not increased medical risk emerges.  2. Tachycardia: Transient associated with her anxiety. HR now down to 65 BPM after patient has calmed down.  3. Anxiety: Will give a 1 time dose of Xanax 0.25 mg.  4. Chronic Pain Syndrome: Continue MS Contin  5. Borderline Personality Disorder: Pt has histrionic tendencies and today her histrionics surround the fact that she had to move  her vehicle from [facility] and also her "worry" regarding her mothers illness.  Time spent during this encounter was 65 minutes. Greater than 50% was spent in face to face contact, counseling and coordination of care.   Jasier Calabretta A. 06/25/2017, 12:54 PM    Juan Kissoon A.  Pager [phone]. If 7PM-7AM, please contact night-coverage.

## 2017-06-25 NOTE — Progress Notes (Signed)
Patient from ED. Alert and oriented x 3. Oriented to room and environment. Vital signs obtained. IV site flushed, site unremarkable.Will continue to monitor.

## 2017-06-26 ENCOUNTER — Observation Stay (HOSPITAL_COMMUNITY)
Admission: EM | Admit: 2017-06-26 | Discharge: 2017-06-28 | Disposition: A | Discharge status: Home / Self Care | Payer: Medicaid Other | Attending: Internal Medicine | Admitting: Internal Medicine

## 2017-06-26 ENCOUNTER — Encounter (HOSPITAL_COMMUNITY): Payer: Self-pay | Admitting: Emergency Medicine

## 2017-06-26 ENCOUNTER — Emergency Department (HOSPITAL_COMMUNITY)
Admission: EM | Admit: 2017-06-26 | Discharge: 2017-06-26 | Disposition: A | Discharge status: Home / Self Care | Payer: Medicaid Other | Source: Home / Self Care | Attending: Emergency Medicine | Admitting: Emergency Medicine

## 2017-06-26 ENCOUNTER — Encounter (HOSPITAL_COMMUNITY): Payer: Self-pay | Admitting: *Deleted

## 2017-06-26 DIAGNOSIS — E876 Hypokalemia: Secondary | ICD-10-CM | POA: Diagnosis present

## 2017-06-26 DIAGNOSIS — F449 Dissociative and conversion disorder, unspecified: Secondary | ICD-10-CM | POA: Diagnosis present

## 2017-06-26 DIAGNOSIS — D5700 Hb-SS disease with crisis, unspecified: Secondary | ICD-10-CM

## 2017-06-26 DIAGNOSIS — R011 Cardiac murmur, unspecified: Secondary | ICD-10-CM | POA: Insufficient documentation

## 2017-06-26 DIAGNOSIS — Z7982 Long term (current) use of aspirin: Secondary | ICD-10-CM | POA: Diagnosis not present

## 2017-06-26 DIAGNOSIS — D638 Anemia in other chronic diseases classified elsewhere: Secondary | ICD-10-CM | POA: Diagnosis present

## 2017-06-26 DIAGNOSIS — G894 Chronic pain syndrome: Secondary | ICD-10-CM | POA: Diagnosis not present

## 2017-06-26 DIAGNOSIS — F6089 Other specific personality disorders: Secondary | ICD-10-CM | POA: Insufficient documentation

## 2017-06-26 DIAGNOSIS — F609 Personality disorder, unspecified: Secondary | ICD-10-CM | POA: Diagnosis present

## 2017-06-26 DIAGNOSIS — Z79899 Other long term (current) drug therapy: Secondary | ICD-10-CM | POA: Insufficient documentation

## 2017-06-26 LAB — CBC WITH DIFFERENTIAL/PLATELET
BASOS PCT: 0 %
Basophils Absolute: 0 10*3/uL (ref 0.0–0.1)
EOS ABS: 0.1 10*3/uL (ref 0.0–0.7)
EOS PCT: 1 %
HCT: 26 % — ABNORMAL LOW (ref 36.0–46.0)
HEMOGLOBIN: 9.4 g/dL — AB (ref 12.0–15.0)
Lymphocytes Relative: 25 %
Lymphs Abs: 3.2 10*3/uL (ref 0.7–4.0)
MCH: 29.9 pg (ref 26.0–34.0)
MCHC: 36.2 g/dL — AB (ref 30.0–36.0)
MCV: 82.8 fL (ref 78.0–100.0)
MONOS PCT: 11 %
Monocytes Absolute: 1.4 10*3/uL — ABNORMAL HIGH (ref 0.1–1.0)
Neutro Abs: 8.1 10*3/uL — ABNORMAL HIGH (ref 1.7–7.7)
Neutrophils Relative %: 63 %
PLATELETS: 582 10*3/uL — AB (ref 150–400)
RBC: 3.14 MIL/uL — AB (ref 3.87–5.11)
RDW: 17.9 % — ABNORMAL HIGH (ref 11.5–15.5)
WBC: 12.7 10*3/uL — AB (ref 4.0–10.5)

## 2017-06-26 LAB — BASIC METABOLIC PANEL
Anion gap: 9 (ref 5–15)
CALCIUM: 9.4 mg/dL (ref 8.9–10.3)
CO2: 25 mmol/L (ref 22–32)
CREATININE: 0.48 mg/dL (ref 0.44–1.00)
Chloride: 108 mmol/L (ref 101–111)
GFR calc non Af Amer: >60 mL/min (ref >60)
Glucose, Bld: 95 mg/dL (ref 65–99)
Potassium: 2.9 mmol/L — ABNORMAL LOW (ref 3.5–5.1)
SODIUM: 142 mmol/L (ref 135–145)

## 2017-06-26 LAB — RETICULOCYTES
RBC.: 3.14 MIL/uL — AB (ref 3.87–5.11)
RETIC COUNT ABSOLUTE: 348.5 10*3/uL — AB (ref 19.0–186.0)
RETIC CT PCT: 11.1 % — AB (ref 0.4–3.1)

## 2017-06-26 MED ORDER — HYDROMORPHONE HCL 1 MG/ML IJ SOLN: 2.0000 mg | INTRAMUSCULAR | Status: AC

## 2017-06-26 MED ORDER — HYDROMORPHONE HCL 1 MG/ML IJ SOLN: 2.0000 mg | INTRAMUSCULAR | Status: DC

## 2017-06-26 MED ORDER — HYDROMORPHONE HCL 1 MG/ML IJ SOLN
2.0000 mg | INTRAMUSCULAR | Status: AC
  Administered 2017-06-26: 2 mg via INTRAVENOUS

## 2017-06-26 MED ORDER — PROMETHAZINE HCL 25 MG PO TABS: 25.0000 mg | ORAL_TABLET | ORAL | Status: DC | PRN

## 2017-06-26 MED ORDER — KETOROLAC TROMETHAMINE 30 MG/ML IJ SOLN: 30.0000 mg | INTRAMUSCULAR | Status: DC

## 2017-06-26 MED ORDER — HYDROMORPHONE HCL 1 MG/ML IJ SOLN
2.0000 mg | INTRAMUSCULAR | Status: AC
  Filled 2017-06-26 (×2): qty 2

## 2017-06-26 MED ORDER — HYDROMORPHONE HCL 1 MG/ML IJ SOLN
2.0000 mg | INTRAMUSCULAR | Status: AC
  Administered 2017-06-26: 2 mg via SUBCUTANEOUS
  Filled 2017-06-26: qty 2

## 2017-06-26 MED ORDER — POTASSIUM CHLORIDE CRYS ER 20 MEQ PO TBCR
40.0000 meq | EXTENDED_RELEASE_TABLET | Freq: Once | ORAL | Status: AC
  Administered 2017-06-26: 40 meq via ORAL
  Filled 2017-06-26: qty 2

## 2017-06-26 MED ORDER — HYDROMORPHONE HCL 1 MG/ML IJ SOLN
2.0000 mg | INTRAMUSCULAR | Status: DC
  Administered 2017-06-26: 2 mg via INTRAVENOUS

## 2017-06-26 MED ORDER — KETOROLAC TROMETHAMINE 30 MG/ML IJ SOLN
30.0000 mg | INTRAMUSCULAR | Status: AC
  Administered 2017-06-26: 30 mg via INTRAMUSCULAR
  Filled 2017-06-26: qty 1

## 2017-06-26 NOTE — ED Provider Notes (Signed)
[facility] DEPT Provider Note   CSN: [id number] Arrival date & time: 06/26/17  1218     History   Chief Complaint Chief Complaint  Patient presents with  . Sickle Cell Pain Crisis  . Nausea  . Diarrhea    HPI Carmen [name] is a 25 y.o. female w PMHx sickle cell (Hb SS), s/p splenectomy and cholecystectomy, anemia of chronic disease, presents for persistent sickle cell pain. Pt was seen in the ED yesterday for crisis, and was admitted for observation for pain management. Patient was discharged yesterday evening, and states around midnight she vomited and has been having intermittent diarrhea since then. She reports mild intermittent abd pain that is relieved with bowel movement. She reports back pain that is typical for her sickle cell pain. She states she took oxycodone 10 mg last night, however has not taken any medicine today. She is also prescribed extended release PO or pain, however has not taken any. She states she spoke with her sickle cell doctor this morning, Dr. Alyson Ingles, who she reports will see her tomorrow morning. She states he recommended she come here for pain management until she can be seen in the morning. Denies chest pain, shortness of breath, fever, blood in her stool, or any other complaints today.  The history is provided by the patient.    Past Medical History:  Diagnosis Date  . Abortion    05/2012  . Headache(784.0)   . Sickle cell crisis [facility])     Patient Active Problem List   Diagnosis Date Noted  . Hb-SS disease with vaso-occlusive pain ([place]) 06/25/2017  . Sickle cell pain crisis ([place]) 06/21/2017  . Fever 06/21/2017  . Sickle cell anemia with pain ([place]) 05/29/2017  . Hb-SS disease without crisis ([place]) 08/15/2016  . Anemia of chronic disease   . S/P laparoscopic cholecystectomy 11/30/2014  . Thrombocytosis ([place]) 11/22/2014  . Systolic ejection murmur 40/98/1191   Past Surgical History:  Procedure Laterality Date  . CHOLECYSTECTOMY N/A  11/30/2014   Procedure: LAPAROSCOPIC CHOLECYSTECTOMY SINGLE SITE WITH INTRAOPERATIVE CHOLANGIOGRAM;  Surgeon: Michael Boston, MD;  Location: [facility] ORS;  Service: General;  Laterality: N/A;  . SPLENECTOMY      OB History    Gravida Para Term Preterm AB Living   1       1     SAB TAB Ectopic Multiple Live Births                   Home Medications    Prior to Admission medications   Medication Sig Start Date End Date Taking? Authorizing Provider  aspirin EC 81 MG tablet Take 1 tablet (81 mg total) by mouth daily. 05/31/17  Yes Leana Gamer, MD  morphine (MS CONTIN) 60 MG 12 hr tablet Take 1 tablet (60 mg total) by mouth every 12 (twelve) hours. 05/31/17  Yes Leana Gamer, MD  oxyCODONE-acetaminophen (PERCOCET) 10-325 MG tablet Take 1 tablet by mouth every 4 (four) hours as needed for severe pain. 05/09/17  Yes [provider]    Family History Family History  Problem Relation Age of Onset  . Hypertension Mother   . Sickle cell anemia Sister   . Kidney disease Sister        Lupus  . Arthritis Sister   . Sickle cell anemia Sister   . Sickle cell trait Sister   . Heart disease Maternal Aunt        CABG  . Heart disease Maternal Uncle  CABG  . Lupus Sister     Social History Social History  Substance Use Topics  . Smoking status: Never Smoker  . Smokeless tobacco: Never Used  . Alcohol use No     Allergies   Patient has no known allergies.   Review of Systems Review of Systems  Constitutional: Negative for chills and fever.  HENT: Negative.   Eyes: Negative for visual disturbance.  Respiratory: Negative for shortness of breath.   Cardiovascular: Negative for chest pain.  Gastrointestinal: Positive for diarrhea, nausea and vomiting. Negative for constipation.  Genitourinary: Negative.   Musculoskeletal: Positive for back pain.  Skin: Negative.   Neurological: Negative for headaches.     Physical Exam Updated Vital Signs BP 124/78    Pulse (!) 53   Temp 98.5 F (36.9 C) (Oral)   Resp 18   Ht 5\' 3"  (1.6 m)   Wt 77.1 kg (170 lb)   LMP 06/05/2017   SpO2 94%   BMI 30.11 kg/m   Physical Exam  Constitutional: She is oriented to person, place, and time. She appears well-developed and well-nourished. No distress.  HENT:  Head: Normocephalic and atraumatic.  Mouth/Throat: Oropharynx is clear and moist.  Eyes: Pupils are equal, round, and reactive to light. Conjunctivae and EOM are normal.  Neck: Normal range of motion. Neck supple.  Cardiovascular: Normal rate, regular rhythm, normal heart sounds and intact distal pulses.  Exam reveals no friction rub.   No murmur heard. Pulmonary/Chest: Effort normal and breath sounds normal. No respiratory distress. She has no wheezes. She has no rales.  Abdominal: Soft. Bowel sounds are normal. She exhibits no distension and no mass. There is no tenderness. There is no rebound and no guarding. No hernia.  Musculoskeletal: Normal range of motion.  Diffuse tenderness over back  Neurological: She is alert and oriented to person, place, and time. No cranial nerve deficit or sensory deficit. She exhibits normal muscle tone. Coordination normal.  Skin: Skin is warm.  Psychiatric: She has a normal mood and affect. Her behavior is normal.  Nursing note and vitals reviewed.    ED Treatments / Results  Labs (all labs ordered are listed, but only abnormal results are displayed) Labs Reviewed  RETICULOCYTES - Abnormal; Notable for the following:       Result Value   Retic Ct Pct 11.1 (*)    RBC. 3.14 (*)    Retic Count, Absolute 348.5 (*)    All other components within normal limits  BASIC METABOLIC PANEL - Abnormal; Notable for the following:    Potassium 2.9 (*)    BUN <5 (*)    All other components within normal limits  CBC WITH DIFFERENTIAL/PLATELET - Abnormal; Notable for the following:    WBC 12.7 (*)    RBC 3.14 (*)    Hemoglobin 9.4 (*)    HCT 26.0 (*)    MCHC 36.2 (*)     RDW 17.9 (*)    Platelets 582 (*)    Neutro Abs 8.1 (*)    Monocytes Absolute 1.4 (*)    All other components within normal limits    EKG  EKG Interpretation None       Radiology No results found.  Procedures Procedures (including critical care time)  Medications Ordered in ED Medications  HYDROmorphone (DILAUDID) injection 2 mg (not administered)    Or  HYDROmorphone (DILAUDID) injection 2 mg (not administered)  HYDROmorphone (DILAUDID) injection 2 mg (2 mg Intravenous Given 06/26/17 1628)  Or  HYDROmorphone (DILAUDID) injection 2 mg ( Subcutaneous See Alternative 06/26/17 1628)  promethazine (PHENERGAN) tablet 25 mg (not administered)  HYDROmorphone (DILAUDID) injection 2 mg ( Intravenous See Alternative 06/26/17 1421)    Or  HYDROmorphone (DILAUDID) injection 2 mg (2 mg Subcutaneous Given 06/26/17 1421)  HYDROmorphone (DILAUDID) injection 2 mg (2 mg Intravenous Given 06/26/17 1536)    Or  HYDROmorphone (DILAUDID) injection 2 mg ( Subcutaneous See Alternative 06/26/17 1536)  ketorolac (TORADOL) 30 MG/ML injection 30 mg (30 mg Intramuscular Given 06/26/17 1410)  potassium chloride SA (K-DUR,KLOR-CON) CR tablet 40 mEq (40 mEq Oral Given 06/26/17 1629)     Initial Impression / Assessment and Plan / ED Course  I have reviewed the triage vital signs and the nursing notes.  Pertinent labs & imaging results that were available during my care of the patient were reviewed by me and considered in my medical decision making (see chart for details).     Patient presenting with sickle cell crisis. Patient with typical symptoms and pain. Pt without CP or SOB. Abdomen is nontender. Pt is not exhibiting signs or symptoms of acute chest syndrome, organ failure, or DVT. Pt is afebrile, hemodynamically stable. WBC without significant change from yesterday. Retic appropriately elevated. Hgb elevated from baseline. Patient treated with sickle cell protocol,  with improvement in symptoms. Pt  tolerating PO. Pt is safe for discharge with symptomatic management. Strict return precautions discussed.  Patient discussed with  Dr. Dayna Barker.  Discussed results, findings, treatment and follow up. Patient advised of return precautions. Patient verbalized understanding and agreed with plan.   Final Clinical Impressions(s) / ED Diagnoses   Final diagnoses:  Sickle cell pain crisis [facility])    New Prescriptions New Prescriptions   No medications on file     Russo, [place] N, PA-C 06/26/17 1717    Mesner, Corene Cornea, MD 06/27/17 1036

## 2017-06-26 NOTE — ED Triage Notes (Signed)
Pt c/o R/L lower abdominal pain and cramping. Pt states she had several episodes of vomiting and diarrhea since last night. Pt states she was seen yesterday for sickle cell pain and continues to have chronic lower back pain. Pt has not been able to tolerate po x 24 hours.

## 2017-06-26 NOTE — ED Notes (Signed)
IV team reports unsuccessful x2.

## 2017-06-26 NOTE — ED Notes (Signed)
IV team at bedside 

## 2017-06-26 NOTE — ED Triage Notes (Signed)
The pt is c/o sickle cell pain in her legs and back for one week  She was seen earlier today at [facility] long ed for the same   She reports she was feeling better but now the pain has increased  lmp aug 5th

## 2017-06-26 NOTE — Discharge Instructions (Signed)
Please read instructions below. Follow-up with [facility] in the morning. Continue drinking fluids. Take your pain medication as prescribed. Return to the ER for chest pain, shortness of breath, fever, or new or concerning symptoms.

## 2017-06-26 NOTE — ED Notes (Signed)
ED Provider at bedside. 

## 2017-06-26 NOTE — ED Notes (Signed)
Manuela Schwartz RN asked to attempt [place] IV.

## 2017-06-27 ENCOUNTER — Encounter (HOSPITAL_COMMUNITY): Payer: Self-pay | Admitting: Internal Medicine

## 2017-06-27 DIAGNOSIS — E876 Hypokalemia: Secondary | ICD-10-CM | POA: Diagnosis present

## 2017-06-27 DIAGNOSIS — D5700 Hb-SS disease with crisis, unspecified: Secondary | ICD-10-CM | POA: Diagnosis not present

## 2017-06-27 LAB — COMPREHENSIVE METABOLIC PANEL
ALK PHOS: 68 U/L (ref 38–126)
ALT: 29 U/L (ref 14–54)
AST: 34 U/L (ref 15–41)
Albumin: 3.8 g/dL (ref 3.5–5.0)
Anion gap: 9 (ref 5–15)
BUN: 6 mg/dL (ref 6–20)
CALCIUM: 9.4 mg/dL (ref 8.9–10.3)
CO2: 24 mmol/L (ref 22–32)
CREATININE: 0.61 mg/dL (ref 0.44–1.00)
Chloride: 107 mmol/L (ref 101–111)
Glucose, Bld: 81 mg/dL (ref 65–99)
Potassium: 3.8 mmol/L (ref 3.5–5.1)
Sodium: 140 mmol/L (ref 135–145)
TOTAL PROTEIN: 7.2 g/dL (ref 6.5–8.1)
Total Bilirubin: 1.8 mg/dL — ABNORMAL HIGH (ref 0.3–1.2)

## 2017-06-27 LAB — CBC
HCT: 24.2 % — ABNORMAL LOW (ref 36.0–46.0)
Hemoglobin: 8.4 g/dL — ABNORMAL LOW (ref 12.0–15.0)
MCH: 28.9 pg (ref 26.0–34.0)
MCHC: 34.7 g/dL (ref 30.0–36.0)
MCV: 83.2 fL (ref 78.0–100.0)
PLATELETS: 520 10*3/uL — AB (ref 150–400)
RBC: 2.91 MIL/uL — AB (ref 3.87–5.11)
RDW: 17.1 % — AB (ref 11.5–15.5)
WBC: 12.8 10*3/uL — AB (ref 4.0–10.5)

## 2017-06-27 LAB — CBC WITH DIFFERENTIAL/PLATELET
BASOS ABS: 0.1 10*3/uL (ref 0.0–0.1)
Basophils Relative: 0 %
EOS ABS: 0.1 10*3/uL (ref 0.0–0.7)
EOS PCT: 1 %
HCT: 26.1 % — ABNORMAL LOW (ref 36.0–46.0)
Hemoglobin: 9.2 g/dL — ABNORMAL LOW (ref 12.0–15.0)
LYMPHS ABS: 4.8 10*3/uL — AB (ref 0.7–4.0)
LYMPHS PCT: 39 %
MCH: 29.1 pg (ref 26.0–34.0)
MCHC: 35.2 g/dL (ref 30.0–36.0)
MCV: 82.6 fL (ref 78.0–100.0)
MONO ABS: 1.9 10*3/uL — AB (ref 0.1–1.0)
Monocytes Relative: 15 %
Neutro Abs: 5.5 10*3/uL (ref 1.7–7.7)
Neutrophils Relative %: 45 %
PLATELETS: 527 10*3/uL — AB (ref 150–400)
RBC: 3.16 MIL/uL — ABNORMAL LOW (ref 3.87–5.11)
RDW: 17.8 % — AB (ref 11.5–15.5)
WBC: 12.4 10*3/uL — ABNORMAL HIGH (ref 4.0–10.5)

## 2017-06-27 LAB — MAGNESIUM: Magnesium: 1.7 mg/dL (ref 1.7–2.4)

## 2017-06-27 LAB — RETICULOCYTES
RBC.: 3.16 MIL/uL — ABNORMAL LOW (ref 3.87–5.11)
Retic Count, Absolute: 372.9 10*3/uL — ABNORMAL HIGH (ref 19.0–186.0)
Retic Ct Pct: 11.8 % — ABNORMAL HIGH (ref 0.4–3.1)

## 2017-06-27 LAB — I-STAT CG4 LACTIC ACID, ED
LACTIC ACID, VENOUS: 2.28 mmol/L — AB (ref 0.5–1.9)
Lactic Acid, Venous: 1.72 mmol/L (ref 0.5–1.9)

## 2017-06-27 LAB — URINALYSIS, ROUTINE W REFLEX MICROSCOPIC
BILIRUBIN URINE: NEGATIVE
Glucose, UA: NEGATIVE mg/dL
Hgb urine dipstick: NEGATIVE
KETONES UR: NEGATIVE mg/dL
Leukocytes, UA: NEGATIVE
NITRITE: NEGATIVE
Protein, ur: NEGATIVE mg/dL
Specific Gravity, Urine: 1.009 (ref 1.005–1.030)
pH: 7 (ref 5.0–8.0)

## 2017-06-27 LAB — PREGNANCY, URINE: Preg Test, Ur: NEGATIVE

## 2017-06-27 LAB — LACTATE DEHYDROGENASE: LDH: 186 U/L (ref 98–192)

## 2017-06-27 LAB — CREATININE, SERUM: CREATININE: 0.46 mg/dL (ref 0.44–1.00)

## 2017-06-27 LAB — POC URINE PREG, ED: Preg Test, Ur: NEGATIVE

## 2017-06-27 MED ORDER — HYDROMORPHONE HCL 1 MG/ML IJ SOLN
1.0000 mg | Freq: Once | INTRAMUSCULAR | Status: AC
  Administered 2017-06-27: 1 mg via INTRAVENOUS
  Filled 2017-06-27: qty 1

## 2017-06-27 MED ORDER — HYDROMORPHONE 1 MG/ML IV SOLN
INTRAVENOUS | Status: DC
  Administered 2017-06-27: 9.24 mg via INTRAVENOUS
  Administered 2017-06-27: 13:00:00 via INTRAVENOUS
  Filled 2017-06-27: qty 25

## 2017-06-27 MED ORDER — DEXTROSE 5 % AND 0.45 % NACL IV BOLUS
1000.0000 mL | Freq: Once | INTRAVENOUS | Status: AC
  Administered 2017-06-27: 1000 mL via INTRAVENOUS

## 2017-06-27 MED ORDER — ASPIRIN EC 81 MG PO TBEC
81.0000 mg | DELAYED_RELEASE_TABLET | Freq: Every day | ORAL | Status: DC
  Administered 2017-06-27 – 2017-06-28 (×2): 81 mg via ORAL
  Filled 2017-06-27 (×2): qty 1

## 2017-06-27 MED ORDER — HYDROMORPHONE HCL 1 MG/ML IJ SOLN: 2.0000 mg | INTRAMUSCULAR | Status: AC

## 2017-06-27 MED ORDER — HYDROMORPHONE HCL 1 MG/ML IJ SOLN
2.0000 mg | INTRAMUSCULAR | Status: AC
  Administered 2017-06-27 (×2): 2 mg via INTRAVENOUS
  Filled 2017-06-27 (×2): qty 2

## 2017-06-27 MED ORDER — SENNOSIDES-DOCUSATE SODIUM 8.6-50 MG PO TABS
1.0000 | ORAL_TABLET | Freq: Two times a day (BID) | ORAL | Status: DC
  Administered 2017-06-27 – 2017-06-28 (×3): 1 via ORAL
  Filled 2017-06-27 (×3): qty 1

## 2017-06-27 MED ORDER — HYDROMORPHONE HCL 1 MG/ML IJ SOLN: 2.0000 mg | INTRAMUSCULAR | Status: DC

## 2017-06-27 MED ORDER — HYDROMORPHONE HCL 1 MG/ML IJ SOLN
2.0000 mg | INTRAMUSCULAR | Status: AC
  Filled 2017-06-27: qty 2

## 2017-06-27 MED ORDER — MAGNESIUM SULFATE 2 GM/50ML IV SOLN
2.0000 g | Freq: Once | INTRAVENOUS | Status: AC
  Administered 2017-06-27: 2 g via INTRAVENOUS
  Filled 2017-06-27: qty 50

## 2017-06-27 MED ORDER — HYDROMORPHONE 1 MG/ML IV SOLN
INTRAVENOUS | Status: DC
  Administered 2017-06-27: 4.25 mg via INTRAVENOUS
  Administered 2017-06-28: 4.62 mg via INTRAVENOUS
  Administered 2017-06-28: 9.4 mg via INTRAVENOUS
  Administered 2017-06-28: 01:00:00 via INTRAVENOUS
  Administered 2017-06-28: 4.25 mg via INTRAVENOUS
  Filled 2017-06-27: qty 25

## 2017-06-27 MED ORDER — NALOXONE HCL 0.4 MG/ML IJ SOLN: 0.4000 mg | INTRAMUSCULAR | Status: DC | PRN

## 2017-06-27 MED ORDER — ONDANSETRON HCL 4 MG/2ML IJ SOLN
4.0000 mg | INTRAMUSCULAR | Status: DC | PRN
  Filled 2017-06-27: qty 2

## 2017-06-27 MED ORDER — OXYCODONE HCL 5 MG PO TABS
5.0000 mg | ORAL_TABLET | ORAL | Status: DC
  Administered 2017-06-27 – 2017-06-28 (×6): 5 mg via ORAL
  Filled 2017-06-27 (×7): qty 1

## 2017-06-27 MED ORDER — IBUPROFEN 200 MG PO TABS
600.0000 mg | ORAL_TABLET | Freq: Four times a day (QID) | ORAL | Status: DC
  Administered 2017-06-27 – 2017-06-28 (×4): 600 mg via ORAL
  Filled 2017-06-27 (×4): qty 3

## 2017-06-27 MED ORDER — HYDROMORPHONE HCL 1 MG/ML IJ SOLN
2.0000 mg | INTRAMUSCULAR | Status: AC
  Administered 2017-06-27: 2 mg via INTRAVENOUS
  Filled 2017-06-27: qty 2

## 2017-06-27 MED ORDER — SODIUM CHLORIDE 0.9% FLUSH: 9.0000 mL | INTRAVENOUS | Status: DC | PRN

## 2017-06-27 MED ORDER — ONDANSETRON HCL 4 MG/2ML IJ SOLN: 4.0000 mg | Freq: Four times a day (QID) | INTRAMUSCULAR | Status: DC | PRN

## 2017-06-27 MED ORDER — OXYCODONE-ACETAMINOPHEN 5-325 MG PO TABS
1.0000 | ORAL_TABLET | ORAL | Status: DC
  Administered 2017-06-27 – 2017-06-28 (×6): 1 via ORAL
  Filled 2017-06-27 (×7): qty 1

## 2017-06-27 MED ORDER — POLYETHYLENE GLYCOL 3350 17 G PO PACK: 17.0000 g | PACK | Freq: Every day | ORAL | Status: DC | PRN

## 2017-06-27 MED ORDER — ENOXAPARIN SODIUM 40 MG/0.4ML ~~LOC~~ SOLN
40.0000 mg | SUBCUTANEOUS | Status: DC
  Administered 2017-06-27: 40 mg via SUBCUTANEOUS
  Filled 2017-06-27: qty 0.4

## 2017-06-27 MED ORDER — HYDROMORPHONE HCL 1 MG/ML IJ SOLN
2.0000 mg | INTRAMUSCULAR | Status: AC
  Administered 2017-06-27: 2 mg via SUBCUTANEOUS

## 2017-06-27 MED ORDER — SODIUM CHLORIDE 0.45 % IV SOLN
INTRAVENOUS | Status: DC
Start: 2017-06-27 — End: 2017-06-28
  Administered 2017-06-27: 08:00:00 via INTRAVENOUS

## 2017-06-27 MED ORDER — MORPHINE SULFATE ER 30 MG PO TBCR
60.0000 mg | EXTENDED_RELEASE_TABLET | Freq: Two times a day (BID) | ORAL | Status: DC
  Administered 2017-06-27 – 2017-06-28 (×3): 60 mg via ORAL
  Filled 2017-06-27 (×3): qty 2

## 2017-06-27 MED ORDER — PANTOPRAZOLE SODIUM 40 MG PO TBEC
40.0000 mg | DELAYED_RELEASE_TABLET | Freq: Every day | ORAL | Status: DC
  Administered 2017-06-27 – 2017-06-28 (×2): 40 mg via ORAL
  Filled 2017-06-27 (×2): qty 1

## 2017-06-27 NOTE — ED Notes (Signed)
Unsuccessful IV attempt by this RN 

## 2017-06-27 NOTE — ED Notes (Signed)
This RN spoke with Dr. Liston Alba regarding a pain medication order for patient prior to her being transferred to [facility]. Dr. Zigmund Daniel advised this RN that she cannot order anything for patient until after she sees and assesses her and that the ED MD needs to order pain meds for her. This RN advised Dr. Zigmund Daniel that the ED MD does not know the patient since she was seen by the ED MD last night and that since the patient is admitted the team caring for her in the hospital would be the ones who normally place orders for the patient. Dr. Zigmund Daniel advised she would still not be able to order anything for patient's pain and that the ED MD needed to be consulted regarding this.

## 2017-06-27 NOTE — ED Notes (Signed)
Dr. Zigmund Daniel paged regarding order for dose of pain medication prior to pts transfer to [facility]

## 2017-06-27 NOTE — ED Notes (Signed)
Janett Billow, Charge RN reviewed EMTALA prior to pt being transported

## 2017-06-27 NOTE — ED Notes (Signed)
Dr. Ashok Cordia made aware of need for updated EMTALA, Dr. Ashok Cordia in to assess patient.

## 2017-06-27 NOTE — ED Notes (Signed)
Dr. Ashok Cordia and PA Sophia made aware of situation and pts need for pain meds prior to transfer to [facility]. Dr. Ashok Cordia advised they could place order for patient's pain meds while she is waiting

## 2017-06-27 NOTE — ED Notes (Signed)
[facility]link at bedside 

## 2017-06-27 NOTE — ED Provider Notes (Signed)
[place] DEPT Provider Note   CSN: [id number] Arrival date & time: 06/26/17  2226     History   Chief Complaint Chief Complaint  Patient presents with  . Sickle Cell Pain Crisis    HPI Carmen [name] is a 26 y.o. female history of sickle cell disease presents to the ED for complaints of midline low back pain that radiates to her legs and upper extremities 1 week. Pain is typical of her sickle cell pain. Was seen in the ED at [facility] and discharged this morning, states she went home and her pain has returned. He takes morphine and oxycodone, nonsense discharge earlier today. Denies fevers, chest pain, shortness of breath, vomiting, diarrhea, dysuria or vaginal bleeding.  HPI  Past Medical History:  Diagnosis Date  . Abortion    05/2012  . Headache(784.0)   . Sickle cell crisis [facility])     Patient Active Problem List   Diagnosis Date Noted  . 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 Sickle cell pain crisis ([place]) 06/21/2017  . Fever 06/21/2017  . Sickle cell anemia with pain ([place]) 05/29/2017  . Hb-SS disease without crisis ([place]) 08/15/2016  . Anemia of chronic disease   . S/P laparoscopic cholecystectomy 11/30/2014  . Thrombocytosis ([place]) 11/22/2014  . Systolic ejection murmur 22/29/7989    Past Surgical History:  Procedure Laterality Date  . CHOLECYSTECTOMY N/A 11/30/2014   Procedure: LAPAROSCOPIC CHOLECYSTECTOMY SINGLE SITE WITH INTRAOPERATIVE CHOLANGIOGRAM;  Surgeon: Michael Boston, MD;  Location: [facility] ORS;  Service: General;  Laterality: N/A;  . SPLENECTOMY      OB History    Gravida Para Term Preterm AB Living   1       1     SAB TAB Ectopic Multiple Live Births                   Home Medications    Prior to Admission medications   Medication Sig Start Date End Date Taking? Authorizing Provider  aspirin EC 81 MG tablet Take 1 tablet (81 mg total) by mouth daily. 05/31/17  Yes Leana Gamer, MD  morphine (MS CONTIN) 60 MG 12 hr  tablet Take 1 tablet (60 mg total) by mouth every 12 (twelve) hours. 05/31/17  Yes Leana Gamer, MD  oxyCODONE-acetaminophen (PERCOCET) 10-325 MG tablet Take 1 tablet by mouth every 4 (four) hours as needed for severe pain. 05/09/17  Yes [provider]    Family History Family History  Problem Relation Age of Onset  . Hypertension Mother   . Sickle cell anemia Sister   . Kidney disease Sister        Lupus  . Arthritis Sister   . Sickle cell anemia Sister   . Sickle cell trait Sister   . Heart disease Maternal Aunt        CABG  . Heart disease Maternal Uncle        CABG  . Lupus Sister     Social History Social History  Substance Use Topics  . Smoking status: Never Smoker  . Smokeless tobacco: Never Used  . Alcohol use No     Allergies   Patient has no known allergies.   Review of Systems Review of Systems  Constitutional: Negative for chills and fever.  HENT: Negative for sore throat.   Eyes: Negative for visual disturbance.  Respiratory: Negative for cough, chest tightness and shortness of breath.   Cardiovascular: Negative for chest pain and leg swelling.  Gastrointestinal: Negative for abdominal pain, constipation, diarrhea, nausea and vomiting.  Genitourinary: Negative for difficulty urinating, dysuria, flank pain, frequency, hematuria, vaginal bleeding and vaginal discharge.  Musculoskeletal: Positive for arthralgias and myalgias. Negative for joint swelling, neck pain and neck stiffness.  Skin: Negative for color change and rash.  Allergic/Immunologic: Positive for immunocompromised state.  Neurological: Negative for facial asymmetry, light-headedness, numbness and headaches.  Hematological: Does not bruise/bleed easily.  Psychiatric/Behavioral: Negative for confusion.     Physical Exam Updated Vital Signs BP 109/73   Pulse 81   Temp 98.6 F (37 C) (Oral)   Resp 20   Ht 5\' 3"  (1.6 m)   Wt 77.1 kg (170 lb)   LMP 06/05/2017   SpO2 99%    BMI 30.11 kg/m   Physical Exam  Constitutional: She is oriented to person, place, and time. She appears well-developed and well-nourished. She does not appear ill. No distress.  HENT:  Head: Normocephalic and atraumatic.  Moist mucous membranes Oropharynx and tonsils normal  Eyes: Lids are normal.  PERRL and EOMs intact bilaterally No scleral icterus  Neck:  No cervical adenopathy  Cardiovascular: Normal rate, regular rhythm, S1 normal, S2 normal and normal heart sounds.   Pulses:      Carotid pulses are 2+ on the right side, and 2+ on the left side.      Radial pulses are 2+ on the right side, and 2+ on the left side.       Popliteal pulses are 2+ on the right side, and 2+ on the left side.       Dorsalis pedis pulses are 2+ on the right side, and 2+ on the left side.  No LE edema <2 cap refill in fingers and toes  No calf tenderness   Pulmonary/Chest: Effort normal and breath sounds normal. She has no decreased breath sounds. She exhibits no tenderness.  Abdominal: Soft. Normal appearance and bowel sounds are normal. There is no tenderness.  Musculoskeletal: She exhibits tenderness.       Back:  UE and LE exposed without erythema, edema, warmth  Neurological: She is alert and oriented to person, place, and time.  Skin: Skin is warm. Capillary refill takes less than 2 seconds.  Psychiatric: She has a normal mood and affect. Her speech is normal and behavior is normal. Thought content normal. Cognition and memory are normal.  Nursing note and vitals reviewed.    ED Treatments / Results  Labs (all labs ordered are listed, but only abnormal results are displayed) Labs Reviewed  CBC WITH DIFFERENTIAL/PLATELET - Abnormal; Notable for the following:       Result Value   WBC 12.4 (*)    RBC 3.16 (*)    Hemoglobin 9.2 (*)    HCT 26.1 (*)    RDW 17.8 (*)    Platelets 527 (*)    Lymphs Abs 4.8 (*)    Monocytes Absolute 1.9 (*)    All other components within normal limits    RETICULOCYTES - Abnormal; Notable for the following:    Retic Ct Pct 11.8 (*)    RBC. 3.16 (*)    Retic Count, Absolute 372.9 (*)    All other components within normal limits  COMPREHENSIVE METABOLIC PANEL - Abnormal; Notable for the following:    Total Bilirubin 1.8 (*)    All other components within normal limits  I-STAT CG4 LACTIC ACID, ED - Abnormal; Notable for the following:    Lactic Acid, Venous 2.28 (*)  All other components within normal limits  URINALYSIS, ROUTINE W REFLEX MICROSCOPIC  I-STAT CG4 LACTIC ACID, ED  POC URINE PREG, ED    EKG  EKG Interpretation None       Radiology No results found.  Procedures Procedures (including critical care time)  Medications Ordered in ED Medications  HYDROmorphone (DILAUDID) injection 2 mg (2 mg Intravenous Not Given 06/27/17 0216)    Or  HYDROmorphone (DILAUDID) injection 2 mg ( Subcutaneous See Alternative 06/27/17 0216)  ondansetron (ZOFRAN) injection 4 mg (not administered)  HYDROmorphone (DILAUDID) injection 2 mg ( Intravenous See Alternative 06/27/17 0109)    Or  HYDROmorphone (DILAUDID) injection 2 mg (2 mg Subcutaneous Given 06/27/17 0109)  HYDROmorphone (DILAUDID) injection 2 mg (2 mg Intravenous Given 06/27/17 0206)    Or  HYDROmorphone (DILAUDID) injection 2 mg ( Subcutaneous See Alternative 06/27/17 0206)  HYDROmorphone (DILAUDID) injection 2 mg (2 mg Intravenous Given 06/27/17 0355)    Or  HYDROmorphone (DILAUDID) injection 2 mg ( Subcutaneous See Alternative 06/27/17 0355)  dextrose 5 % and 0.45% NaCl 5-0.45 % bolus 1,000 mL (0 mLs Intravenous Stopped 06/27/17 0357)     Initial Impression / Assessment and Plan / ED Course  I have reviewed the triage vital signs and the nursing notes.  Pertinent labs & imaging results that were available during my care of the patient were reviewed by me and considered in my medical decision making (see chart for details).  Clinical Course as of Jun 27 526  Mon Jun 27, 2017  0525 WBC: (!) 12.4 [CG]  0526 Hemoglobin: (!) 9.2 [CG]    Clinical Course User Index [CG] Kinnie Feil, PA-C   Patient with history of sickle cell anemia presents with pain to low back radiating to UE and LE, similar to previous symptoms of SCD pain crisis. Symptoms not responding to home medications. Patient is non toxic appearing. Patient is HD stable. Lab work and physical exam findings most consistent with vaso-occlusive pain episode.  Lactic acid elevated but <3, will give IVF. Infection, ACS, PE, AVN or stroke considered but not consistent with ED work up and clinical picture.  Lab work so far is similar to baseline labs.Patient did not respond to three rounds of IV narcotics pain remains 10/10.  Will consult sickle cell medicine and request admission. Will try to get U/A before transfer.   Spoke to on call sickle cell medicine provider who will see pt ED, recommend transfer to [facility].   Final Clinical Impressions(s) / ED Diagnoses   Final diagnoses:  Sickle cell pain crisis [facility])    New Prescriptions New Prescriptions   No medications on file     Arlean Hopping 06/27/17 [place], Delice Bison, DO 06/27/17 [phone]

## 2017-06-27 NOTE — ED Notes (Signed)
Carmen [name] with Hospitalist team at [facility] made aware of patient's need for pain medication prior to going to [facility]. Carmen [name] advised this RN that sickle cell MD would need to place order for patient to have pain meds, Carmen [name] made aware that sickle cell MD was already notified and stated she could not order anything for patient's pain until she was seen by someone on their team.

## 2017-06-27 NOTE — H&P (Signed)
History and Physical    Carmen [name] TDV:[id number] DOB: [birth year] DOA: 06/26/2017  PCP: [name], MD  Patient coming from: Home.  Chief Complaint: Pain.  HPI: Carmen [name] is a 26 y.o. female with history of sickle cell anemia presents to the ER because of pain. Patient has been having back pain and lower extremity pain for the last few weeks which is acutely worsening. Denies any chest pain shortness of breath headache or visual symptoms fever chills.   ED Course: Patient was given multiple doses of pain relief medications despite which patient was trying pain and has been admitted for further management of sickle cell pain crisis.  Review of Systems: As per HPI, rest all negative.   Past Medical History:  Diagnosis Date  . Abortion    05/2012  . Headache(784.0)   . Sickle cell crisis [facility])     Past Surgical History:  Procedure Laterality Date  . CHOLECYSTECTOMY N/A 11/30/2014   Procedure: LAPAROSCOPIC CHOLECYSTECTOMY SINGLE SITE WITH INTRAOPERATIVE CHOLANGIOGRAM;  Surgeon: Michael Boston, MD;  Location: [facility] ORS;  Service: General;  Laterality: N/A;  . SPLENECTOMY       reports that she has never smoked. She has never used smokeless tobacco. She reports that she does not drink alcohol or use drugs.  No Known Allergies  Family History  Problem Relation Age of Onset  . Hypertension Mother   . Sickle cell anemia Sister   . Kidney disease Sister        Lupus  . Arthritis Sister   . Sickle cell anemia Sister   . Sickle cell trait Sister   . Heart disease Maternal Aunt        CABG  . Heart disease Maternal Uncle        CABG  . Lupus Sister     Prior to Admission medications   Medication Sig Start Date End Date Taking? Authorizing Provider  aspirin EC 81 MG tablet Take 1 tablet (81 mg total) by mouth daily. 05/31/17  Yes Leana Gamer, MD  morphine (MS CONTIN) 60 MG 12 hr tablet Take 1 tablet (60 mg total) by mouth every 12 (twelve) hours. 05/31/17   Yes Leana Gamer, MD  oxyCODONE-acetaminophen (PERCOCET) 10-325 MG tablet Take 1 tablet by mouth every 4 (four) hours as needed for severe pain. 05/09/17  Yes [provider]    Physical Exam: Vitals:   06/27/17 0330 06/27/17 0415 06/27/17 0445 06/27/17 0515  BP: 112/77 110/68 109/73 118/80  Pulse: 73 81    Resp: (!) 21 14 20  (!) 26  Temp:      TempSrc:      SpO2: 100% 99%    Weight:      Height:          Constitutional: Moderately built and nourished. Vitals:   06/27/17 0330 06/27/17 0415 06/27/17 0445 06/27/17 0515  BP: 112/77 110/68 109/73 118/80  Pulse: 73 81    Resp: (!) 21 14 20  (!) 26  Temp:      TempSrc:      SpO2: 100% 99%    Weight:      Height:       Eyes: Anicteric mild pallor. ENMT: No discharge from the ears eyes nose and mouth. Neck: No mass felt. No neck rigidity. Respiratory: No rhonchi or crepitations. Cardiovascular: S1-S2 no murmurs appreciated. Abdomen: Soft nontender bowel sounds present. Musculoskeletal: No edema. No joint effusion. Skin: No rash. Skin is warm. Neurologic: Alert awake oriented  to time place and person. Moves all extremities. Psychiatric: Appears normal. Normal affect.   Labs on Admission: I have personally reviewed following labs and imaging studies  CBC:  Recent Labs Lab 06/20/17 1511 06/23/17 1034 06/25/17 0950 06/26/17 1529 06/27/17 0054  WBC 16.8* 11.7* 11.6* 12.7* 12.4*  NEUTROABS 13.9* 5.4  --  8.1* 5.5  HGB 9.1* 8.3* 8.5* 9.4* 9.2*  HCT 25.8* 23.1* 23.5* 26.0* 26.1*  MCV 80.6 79.7 81.0 82.8 82.6  PLT 551* 428* 529* 582* 324*   Basic Metabolic Panel:  Recent Labs Lab 06/20/17 1511 06/25/17 0950 06/26/17 1529 06/27/17 0054  NA 138 137 142 140  K 3.3* 4.8 2.9* 3.8  CL 105 101 108 107  CO2 23 24 25 24   GLUCOSE 87 72 95 81  BUN <5* <5* <5* 6  CREATININE 0.57 0.34* 0.48 0.61  CALCIUM 9.5 9.1 9.4 9.4   GFR: Estimated Creatinine Clearance: 106.6 mL/min (by C-G formula based on SCr of  0.61 mg/dL). Liver Function Tests:  Recent Labs Lab 06/20/17 1511 06/27/17 0054  AST 22 34  ALT 11* 29  ALKPHOS 60 68  BILITOT 5.5* 1.8*  PROT 8.1 7.2  ALBUMIN 4.3 3.8   No results for input(s): LIPASE, AMYLASE in the last 168 hours. No results for input(s): AMMONIA in the last 168 hours. Coagulation Profile: No results for input(s): INR, PROTIME in the last 168 hours. Cardiac Enzymes: No results for input(s): CKTOTAL, CKMB, CKMBINDEX, TROPONINI in the last 168 hours. BNP (last 3 results) No results for input(s): PROBNP in the last 8760 hours. HbA1C: No results for input(s): HGBA1C in the last 72 hours. CBG: No results for input(s): GLUCAP in the last 168 hours. Lipid Profile: No results for input(s): CHOL, HDL, LDLCALC, TRIG, CHOLHDL, LDLDIRECT in the last 72 hours. Thyroid Function Tests: No results for input(s): TSH, T4TOTAL, FREET4, T3FREE, THYROIDAB in the last 72 hours. Anemia Panel:  Recent Labs  06/26/17 1529 06/27/17 0054  RETICCTPCT 11.1* 11.8*   Urine analysis:    Component Value Date/Time   COLORURINE YELLOW 06/20/2017 2316   APPEARANCEUR CLEAR 06/20/2017 2316   LABSPEC 1.011 06/20/2017 2316   PHURINE 6.0 06/20/2017 2316   GLUCOSEU NEGATIVE 06/20/2017 2316   HGBUR NEGATIVE 06/20/2017 2316   BILIRUBINUR NEGATIVE 06/20/2017 2316   KETONESUR 20 (A) 06/20/2017 2316   PROTEINUR NEGATIVE 06/20/2017 2316   UROBILINOGEN 1.0 09/12/2015 1548   NITRITE NEGATIVE 06/20/2017 2316   LEUKOCYTESUR NEGATIVE 06/20/2017 2316   Sepsis Labs: @LABRCNTIP (procalcitonin:4,lacticidven:4) ) Recent Results (from the past 240 hour(s))  Rapid strep screen     Status: None   Collection Time: 06/20/17  8:38 PM  Result Value Ref Range Status   Streptococcus, Group A Screen (Direct) NEGATIVE NEGATIVE Final    Comment: (NOTE) A Rapid Antigen test may result negative if the antigen level in the sample is below the detection level of this test. The FDA has not cleared this test  as a stand-alone test therefore the rapid antigen negative result has reflexed to a Group A Strep culture.   Culture, group A strep     Status: None   Collection Time: 06/20/17  8:38 PM  Result Value Ref Range Status   Specimen Description THROAT  Final   Special Requests NONE Reflexed from [id number]  Final   Culture MODERATE STREPTOCOCCUS,BETA HEMOLYTIC NOT GROUP A  Final   Report Status 06/23/2017 FINAL  Final  Rapid strep screen (not at [facility])     Status: None  Collection Time: 06/21/17  4:45 PM  Result Value Ref Range Status   Streptococcus, Group A Screen (Direct) NEGATIVE NEGATIVE Final    Comment: (NOTE) A Rapid Antigen test may result negative if the antigen level in the sample is below the detection level of this test. The FDA has not cleared this test as a stand-alone test therefore the rapid antigen negative result has reflexed to a Group A Strep culture.   Culture, group A strep     Status: None   Collection Time: 06/21/17  4:45 PM  Result Value Ref Range Status   Specimen Description THROAT  Final   Special Requests NONE Reflexed from [id number]  Final   Culture MODERATE STREPTOCOCCUS,BETA HEMOLYTIC NOT GROUP A  Final   Report Status 06/23/2017 FINAL  Final  Culture, expectorated sputum-assessment     Status: None   Collection Time: 06/22/17  2:29 AM  Result Value Ref Range Status   Specimen Description SPUTUM  Final   Special Requests Normal  Final   Sputum evaluation THIS SPECIMEN IS ACCEPTABLE FOR SPUTUM CULTURE  Final   Report Status 06/22/2017 FINAL  Final  Culture, respiratory (NON-Expectorated)     Status: None   Collection Time: 06/22/17  2:29 AM  Result Value Ref Range Status   Specimen Description SPUTUM  Final   Special Requests Normal Reflexed from [id number]  Final   Gram Stain   Final    ABUNDANT WBC PRESENT, PREDOMINANTLY PMN ABUNDANT GRAM POSITIVE COCCI IN CHAINS MODERATE GRAM POSITIVE COCCI IN CLUSTERS MODERATE GRAM NEGATIVE RODS    Culture   Final     Consistent with normal respiratory flora. Performed at [facility], [place] [address] [place], [place] [address]    Report Status 06/24/2017 FINAL  Final     Radiological Exams on Admission: No results found.    Assessment/Plan Principal Problem:   Sickle cell pain crisis ([place]) Active Problems:   Anemia of chronic disease    1. Sickle cell pain crisis - patient has been placed on weight-based Dilaudid PCA. If pregnancy test is negative and we had to be placed on scheduled dose of Toradol. Continue patient's home dose of long-acting penicillin medications. 2. Sickle cell anemia - follow CBC.  Patient will be transferred to [facility] long hospital. Dr. Blaine Hamper will be the accepting physician. Patient agreeable to transfer.  I have reviewed patient's old charts and labs.   DVT prophylaxis: Lovenox. Code Status: Full code.  Family Communication: Discussed with patient.  Disposition Plan: Home.  Consults called: None.  Admission status: Inpatient.    Rise Patience MD [facility] Pager [phone].  If 7PM-7AM, please contact night-coverage [email or web address] Password [email or web address]  06/27/2017, 6:11 AM

## 2017-06-27 NOTE — Progress Notes (Signed)
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 Hypokalemia: Improved after replacement. She does have low magnesium so will replace with IV Magnesium sulfate.  2. Hb SS with pain: Schedule Percocet. Wean PCA for PRN use.  Pt has just received almost 5 days of Toradol and further Toradol places her at risk for GI bleed. Will offer Ibuprofen and GI prophylaxis with Protonix.  3. Anemia of Chronic Disease:  Hb stable. No indication for transfusion 4. Chronic Pain Syndrome: Continue MS Contin.  5. Thrombocytosis: Reactive. Continue ASA 81 mg.  6. Disposition: Pt has been changed to observation status. This is her 3rd hospitalization since her initial presentation for sickle cell crisis on 06/20/2017. Patient has left AMA on 2 occasions after being re-admitted fro the same complaint of crisis. Pt has established that despite whatever pain she is having she is able to function with that pain to the extent that she has singed herself out of the hospital twice and left independent of any assistance. I have explained to the patient that without medical risk, pain is treated to the point of function and she has demonstrated that her function is intact despite her pain. The plan is therefore to discharge her tomorrow unless her medical condition changes acutely.  7. 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 She would benefit from Psychotherapy.    Code Status: Full Code Family Communication: N/A Disposition Plan: Plan to discharge tomorrow.   MATTHEWS,MICHELLE A.  Pager [phone]. If 7PM-7AM, please contact night-coverage.  06/27/2017, 3:58 PM  LOS: 1 day   Interim  History: Pt states that "..myalgias crazy condition" caused her to come back to the hospital. Pt states that she I shaving pain as bad as when she left the last time. I informed patient of her low potassium and Magnesium and of the plan regarding her electrolyte imbalance. I went on to inform patient that she is here under observation status and that the plan was to discharge tomorrow. I tried to explain to patient that, she had demonstrated her functional ability despite her pain and that without medical risk she did not meet admission criteria.  As anticipated, she became boisterous and out rightly misrepresented my words  stating that she was being discharged just because she chose to leave AMA and that she could leave as many time as she wanted to and that it was not my place to tell her what to do. She continued to display extremely dramatic and histrionic verbalizations. She refused to be examined and both myself and her nurse who was present during the entire encounter left her room as she continued her verbal insults.   Consultants:  None  Procedures:  None  Antibiotics:  None    Objective: Vitals:   06/27/17 1015 06/27/17 1028 06/27/17 1145 06/27/17 1233  BP: 135/74 135/74 (!) 155/95   Pulse: 68 68 74   Resp: (!) 22 (!) 22 20 (!) 24  Temp:  98.1 F (36.7 C) 98.7 F (37.1 C)   TempSrc:   Oral   SpO2: 99% 99% 100% 100%  Weight:      Height:       Weight change:   Intake/Output Summary (Last 24 hours)  at 06/27/17 1558 Last data filed at 06/27/17 0357  Gross per 24 hour  Intake             1000 ml  Output                0 ml  Net             1000 ml      Physical Exam General: Alert, awake, oriented x3, in no apparent distress.  HEENT: [place]/AT PEERL, EOMI, anicteric Heart: Refused  Examination. Lungs: Clear to auscultation, no wheezing or rhonchi noted. No increased vocal fremitus resonant to percussion  Abdomen: Refused examination Neuro: Pt moving all extremities  against gravity but refusing formal examination.  Musculoskeletal:Redused examination. Psychiatric: Patient alert and oriented x3. She is currently on a verbal rant due to her disagreement with observation status and plan to discharge tomorrow.    Data Reviewed: Basic Metabolic Panel:  Recent Labs Lab 06/25/17 0950 06/26/17 1529 06/27/17 0054 06/27/17 0622 06/27/17 1015  NA 137 142 140  --   --   K 4.8 2.9* 3.8  --   --   CL 101 108 107  --   --   CO2 24 25 24   --   --   GLUCOSE 72 95 81  --   --   BUN <5* <5* 6  --   --   CREATININE 0.34* 0.48 0.61 0.46  --   CALCIUM 9.1 9.4 9.4  --   --   MG  --   --   --   --  1.7   Liver Function Tests:  Recent Labs Lab 06/27/17 0054  AST 34  ALT 29  ALKPHOS 68  BILITOT 1.8*  PROT 7.2  ALBUMIN 3.8   No results for input(s): LIPASE, AMYLASE in the last 168 hours. No results for input(s): AMMONIA in the last 168 hours. CBC:  Recent Labs Lab 06/23/17 1034 06/25/17 0950 06/26/17 1529 06/27/17 0054 06/27/17 0622  WBC 11.7* 11.6* 12.7* 12.4* 12.8*  NEUTROABS 5.4  --  8.1* 5.5  --   HGB 8.3* 8.5* 9.4* 9.2* 8.4*  HCT 23.1* 23.5* 26.0* 26.1* 24.2*  MCV 79.7 81.0 82.8 82.6 83.2  PLT 428* 529* 582* 527* 520*   Cardiac Enzymes: No results for input(s): CKTOTAL, CKMB, CKMBINDEX, TROPONINI in the last 168 hours. BNP (last 3 results) No results for input(s): BNP in the last 8760 hours.  ProBNP (last 3 results) No results for input(s): PROBNP in the last 8760 hours.  CBG: No results for input(s): GLUCAP in the last 168 hours.  Recent Results (from the past 240 hour(s))  Rapid strep screen     Status: None   Collection Time: 06/20/17  8:38 PM  Result Value Ref Range Status   Streptococcus, Group A Screen (Direct) NEGATIVE NEGATIVE Final    Comment: (NOTE) A Rapid Antigen test may result negative if the antigen level in the sample is below the detection level of this test. The FDA has not cleared this test as a stand-alone  test therefore the rapid antigen negative result has reflexed to a Group A Strep culture.   Culture, group A strep     Status: None   Collection Time: 06/20/17  8:38 PM  Result Value Ref Range Status   Specimen Description THROAT  Final   Special Requests NONE Reflexed from [id number]  Final   Culture MODERATE STREPTOCOCCUS,BETA HEMOLYTIC NOT GROUP A  Final   Report Status 06/23/2017 FINAL  Final  Rapid strep screen (not at [facility])     Status: None   Collection Time: 06/21/17  4:45 PM  Result Value Ref Range Status   Streptococcus, Group A Screen (Direct) NEGATIVE NEGATIVE Final    Comment: (NOTE) A Rapid Antigen test may result negative if the antigen level in the sample is below the detection level of this test. The FDA has not cleared this test as a stand-alone test therefore the rapid antigen negative result has reflexed to a Group A Strep culture.   Culture, group A strep     Status: None   Collection Time: 06/21/17  4:45 PM  Result Value Ref Range Status   Specimen Description THROAT  Final   Special Requests NONE Reflexed from [id number]  Final   Culture MODERATE STREPTOCOCCUS,BETA HEMOLYTIC NOT GROUP A  Final   Report Status 06/23/2017 FINAL  Final  Culture, expectorated sputum-assessment     Status: None   Collection Time: 06/22/17  2:29 AM  Result Value Ref Range Status   Specimen Description SPUTUM  Final   Special Requests Normal  Final   Sputum evaluation THIS SPECIMEN IS ACCEPTABLE FOR SPUTUM CULTURE  Final   Report Status 06/22/2017 FINAL  Final  Culture, respiratory (NON-Expectorated)     Status: None   Collection Time: 06/22/17  2:29 AM  Result Value Ref Range Status   Specimen Description SPUTUM  Final   Special Requests Normal Reflexed from [id number]  Final   Gram Stain   Final    ABUNDANT WBC PRESENT, PREDOMINANTLY PMN ABUNDANT GRAM POSITIVE COCCI IN CHAINS MODERATE GRAM POSITIVE COCCI IN CLUSTERS MODERATE GRAM NEGATIVE RODS    Culture   Final    Consistent with  normal respiratory flora. Performed at [facility], [place] [address] [place], [place] [address]    Report Status 06/24/2017 FINAL  Final     Studies: Dg Chest 2 View  Result Date: 06/20/2017 CLINICAL DATA:  Cough congestion and fever. Patient with sickle cell disease. EXAM: CHEST  2 VIEW COMPARISON:  05/28/2017 FINDINGS: Upper limits normal heart size noted. There is no evidence of focal airspace disease, pulmonary edema, suspicious pulmonary nodule/mass, pleural effusion, or pneumothorax. No acute bony abnormalities are identified. IMPRESSION: Upper limits normal heart size without evidence of acute cardiopulmonary disease. Electronically Signed   By: Margarette Canada M.D.   On: 06/20/2017 15:33   Dg Chest 2 View  Result Date: 05/29/2017 CLINICAL DATA:  Left-sided chest pain.  Sickle cell disease. EXAM: CHEST  2 VIEW COMPARISON:  05/22/2017 FINDINGS: The lungs are clear. The pulmonary vasculature is normal. Heart size is normal. Hilar and mediastinal contours are unremarkable. There is no pleural effusion. IMPRESSION: No active cardiopulmonary disease. Electronically Signed   By: Andreas Newport M.D.   On: 05/29/2017 00:02    Scheduled Meds: . aspirin EC  81 mg Oral Daily  . enoxaparin (LOVENOX) injection  40 mg Subcutaneous Q24H  . HYDROmorphone   Intravenous Q4H  . morphine  60 mg Oral Q12H  . senna-docusate  1 tablet Oral BID   Continuous Infusions: . sodium chloride 100 mL/hr at 06/27/17 0801  . magnesium sulfate 1 - 4 g bolus IVPB      Principal Problem:   Sickle cell pain crisis ([facility]) Active Problems:   Anemia of chronic disease   Hb-SS disease with vaso-occlusive pain ([facility])       In excess of 25 minutes spent during this visit. Greater than 50% involved face  to face contact with the patient for assessment, counseling and coordination of care.

## 2017-06-27 NOTE — ED Provider Notes (Signed)
Patient being transferred to [facility] for inpatient bed.   Pt alert, content, nad.   Vitals:   06/27/17 1015 06/27/17 1028  BP: 135/74 135/74  Pulse: 68 68  Resp: (!) 22 (!) 22  Temp:  98.1 F (36.7 C)  SpO2: 99% 99%   Pt currently appears stable for transfer.      Lajean Saver, MD 06/27/17 1038

## 2017-06-28 DIAGNOSIS — D638 Anemia in other chronic diseases classified elsewhere: Secondary | ICD-10-CM | POA: Diagnosis not present

## 2017-06-28 DIAGNOSIS — D5700 Hb-SS disease with crisis, unspecified: Secondary | ICD-10-CM | POA: Diagnosis not present

## 2017-06-28 DIAGNOSIS — E876 Hypokalemia: Secondary | ICD-10-CM

## 2017-06-28 DIAGNOSIS — F449 Dissociative and conversion disorder, unspecified: Secondary | ICD-10-CM

## 2017-06-28 DIAGNOSIS — F609 Personality disorder, unspecified: Secondary | ICD-10-CM

## 2017-06-28 MED ORDER — IBUPROFEN 600 MG PO TABS: 600.0000 mg | ORAL_TABLET | Freq: Four times a day (QID) | ORAL | 0 refills | Status: DC | PRN

## 2017-06-28 NOTE — Progress Notes (Signed)
Discharge instructions reviewed with patient. Patient verbalized understanding. Patient to be discharged and transported via taxi. Taxi voucher given to patient.

## 2017-06-28 NOTE — Progress Notes (Signed)
MD at bedside to round on patient. Md initiated conversation to give patient explanation of intent to place patient  on observation status and patient became very upset. MD was unable to have a conversation with the patient at this time. Patient became visibly upset raising voice and tearful. MD unable to complete assessment at this time.

## 2017-06-28 NOTE — Discharge Summary (Signed)
Carmen [name] MRN: [id number] DOB/AGE: June 08, 1991 26 y.o.  Admit date: 06/26/2017 Discharge date: 06/28/2017  Primary Care Physician:  Carmen Hey, MD   Discharge Diagnoses:   Patient Active Problem List   Diagnosis Date Noted  . Hypokalemia 06/27/2017  . 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 Sickle cell anemia with pain ([place]) 05/29/2017  . 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 Hb-SS disease without crisis ([place]) 08/15/2016  . Anemia of chronic disease   . S/P laparoscopic cholecystectomy 11/30/2014  . Thrombocytosis ([place]) 11/22/2014  . Systolic ejection murmur 58/52/7782    DISCHARGE MEDICATION: Allergies as of 06/28/2017   No Known Allergies     Medication List    TAKE these medications   aspirin EC 81 MG tablet Take 1 tablet (81 mg total) by mouth daily.   ibuprofen 600 MG tablet Commonly known as:  ADVIL,MOTRIN Take 1 tablet (600 mg total) by mouth every 6 (six) hours as needed.   morphine 60 MG 12 hr tablet Commonly known as:  MS CONTIN Take 1 tablet (60 mg total) by mouth every 12 (twelve) hours.   oxyCODONE-acetaminophen 10-325 MG tablet Commonly known as:  PERCOCET Take 1 tablet by mouth every 4 (four) hours as needed for severe pain.            Discharge Care Instructions        Start     Ordered   06/28/17 0000  ibuprofen (ADVIL,MOTRIN) 600 MG tablet  Every 6 hours PRN     06/28/17 1203   06/28/17 0000  Activity as tolerated - No restrictions     06/28/17 1203   06/28/17 0000  Diet general     06/28/17 1203        Consults:    SIGNIFICANT DIAGNOSTIC STUDIES:  Dg Chest 2 View  Result Date: 06/20/2017 CLINICAL DATA:  Cough congestion and fever. Patient with sickle cell disease. EXAM: CHEST  2 VIEW COMPARISON:  05/28/2017 FINDINGS: Upper limits normal heart size noted. There is no evidence of focal airspace disease, pulmonary edema, suspicious pulmonary  nodule/mass, pleural effusion, or pneumothorax. No acute bony abnormalities are identified. IMPRESSION: Upper limits normal heart size without evidence of acute cardiopulmonary disease. Electronically Signed   By: Margarette Canada M.D.   On: 06/20/2017 15:33      Recent Results (from the past 240 hour(s))  Rapid strep screen     Status: None   Collection Time: 06/20/17  8:38 PM  Result Value Ref Range Status   Streptococcus, Group A Screen (Direct) NEGATIVE NEGATIVE Final    Comment: (NOTE) A Rapid Antigen test may result negative if the antigen level in the sample is below the detection level of this test. The FDA has not cleared this test as a stand-alone test therefore the rapid antigen negative result has reflexed to a Group A Strep culture.   Culture, group A strep     Status: None   Collection Time: 06/20/17  8:38 PM  Result Value Ref Range Status   Specimen Description THROAT  Final   Special Requests NONE Reflexed from [id number]  Final   Culture MODERATE STREPTOCOCCUS,BETA HEMOLYTIC NOT GROUP A  Final   Report Status 06/23/2017 FINAL  Final  Rapid strep screen (not at [facility])     Status: None   Collection Time: 06/21/17  4:45 PM  Result Value Ref Range Status   Streptococcus, Group A Screen (Direct) NEGATIVE NEGATIVE Final  Comment: (NOTE) A Rapid Antigen test may result negative if the antigen level in the sample is below the detection level of this test. The FDA has not cleared this test as a stand-alone test therefore the rapid antigen negative result has reflexed to a Group A Strep culture.   Culture, group A strep     Status: None   Collection Time: 06/21/17  4:45 PM  Result Value Ref Range Status   Specimen Description THROAT  Final   Special Requests NONE Reflexed from [id number]  Final   Culture MODERATE STREPTOCOCCUS,BETA HEMOLYTIC NOT GROUP A  Final   Report Status 06/23/2017 FINAL  Final  Culture, expectorated sputum-assessment     Status: None   Collection Time:  06/22/17  2:29 AM  Result Value Ref Range Status   Specimen Description SPUTUM  Final   Special Requests Normal  Final   Sputum evaluation THIS SPECIMEN IS ACCEPTABLE FOR SPUTUM CULTURE  Final   Report Status 06/22/2017 FINAL  Final  Culture, respiratory (NON-Expectorated)     Status: None   Collection Time: 06/22/17  2:29 AM  Result Value Ref Range Status   Specimen Description SPUTUM  Final   Special Requests Normal Reflexed from [id number]  Final   Gram Stain   Final    ABUNDANT WBC PRESENT, PREDOMINANTLY PMN ABUNDANT GRAM POSITIVE COCCI IN CHAINS MODERATE GRAM POSITIVE COCCI IN CLUSTERS MODERATE GRAM NEGATIVE RODS    Culture   Final    Consistent with normal respiratory flora. Performed at [facility], [place] [address] [place], [place] [address]    Report Status 06/24/2017 FINAL  Final    BRIEF ADMITTING H & P: Ms. Carmen [name] is an opiate tolerant patient with Hb SS who presented to the ED with C/O sickle cell crisis. She was also found to have hypokalemia and borderline hypomagnesemia. She was admitted on an observation status for hypokalemia and pain.    Hospital Course:  Present on Admission: . Anemia of chronic disease . Hypokalemia . 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. Histrionic behavior  This patient was admitted with sickle cell pain and Hypokalemia. The pain is a continuation of her crisis for which she was admitted 2 admissions ago (Tis is her 3rd admission since 8/20 with her leaving AMA for personal reasons on the previous 2 admissions). Pt reported that her pain was as intense as when she left AGAINST MEDICAL ADVICE on the previous occasion. However by her own admission and by her ability to ambulate independently and drive herself, she demonstrated for the 2nd time during this crisis  that her function in the ambulatory setting was not impaired by her pain. She was initially started on a Dilaudid PCA by the  admitting Physician and was also treated with Ibuprofen as she had already received 5 days of Toradol since 06/20/2017. She was transitioned to Percocet and PCA weaned. Pt refused to ambulate but was observed ambulating around the room without difficulty. I have discussed her care with her PMD Dr. Alyson Ingles and she will follow up within 2-3 days.  With regard to the hypokalemia, she reported to the ED Physician on the day prior to admission that she has had an episode of emesis and intermitent diarrhea. However on this admission just a few hours later, she denied vomiting or diarrhea which was confirmed on my interview. Nevertheless the potassium was orally replaced. She was also found to have borderline low Magnesium and this  was also replaced. Repeat potassium levels were normal. She is to follow up with her PMD.  Pt has cluster B personality disorder with histrionic behaviors. Pt was angry that she was in on observation and was not being admitted with a prolonged hospital stay. I made several attempts to explain to the patient that given her demonstration of current normal function and normal functioning in the ambulatory setting despite her complaints of pain did not warrant admission. She became boisterous and hurled personal insults with deliberate mis-interpretation of information given and demanding inappropriate care. I have spoken with her Physician Dr. Alyson Ingles about the frequent histrionic and abusive behaviors during admission and have recommended Psychotherapy.   Hb stable at admission with no clinical signs of derangement.   Nurse present during entire encounter.    Disposition and Follow-up: Pt is discharged in stable condition. She is to follow up with her PMD in 1-2 days at his instruction.   Discharge Instructions    Activity as tolerated - No restrictions    Complete by:  As directed    Diet general    Complete by:  As directed       DISCHARGE EXAM:  General: Alert, awake,  oriented x3, in no apparent distress.  HEENT: [place]/AT PEERL, EOMI, anicteric Neck: Trachea midline, no masses, no thyromegal,y no JVD, no carotid bruit OROPHARYNX: Moist, No exudate/ erythema/lesions.  Heart: Regular rate and rhythm, without murmurs, rubs, gallops or S3. PMI non-displaced. Exam reveals no decreased pulses. Pulmonary/Chest: Normal effort. Breath sounds normal. No. Apnea. Clear to auscultation,no stridor,  no wheezing and no rhonchi noted. No respiratory distress and no tenderness noted. Abdomen: Soft, nontender, nondistended, normal bowel sounds, no masses no hepatosplenomegaly noted. No fluid wave and no ascites. There is no guarding or rebound. Neuro: Alert and oriented to person, place and time. Normal motor skills, Displays no atrophy or tremors and exhibits normal muscle tone.  No focal neurological deficits noted cranial nerves II through XII grossly intact. No sensory deficit noted. Strength at baseline in bilateral upper and lower extremities. Gait observed normal. Musculoskeletal: No warmth swelling or erythema around joints, no spinal tenderness noted. Psychiatric: Patient alert and oriented x3, histrionic and shouting because she in in disagreement with her discharge.Normal cognition, good recent to remote recall. Skin: Skin is warm and dry. No bruising, no ecchymosis and no rash noted. Pt is not diaphoretic. No erythema. No pallor   Blood pressure 111/76, pulse 77, temperature 98.9 F (37.2 C), temperature source Oral, resp. rate 18, height 5\' 3"  (1.6 m), weight 77 kg (169 lb 12.1 oz), last menstrual period 06/05/2017, SpO2 99 %.   Recent Labs  06/26/17 1529 06/27/17 0054 06/27/17 0622 06/27/17 1015  NA 142 140  --   --   K 2.9* 3.8  --   --   CL 108 107  --   --   CO2 25 24  --   --   GLUCOSE 95 81  --   --   BUN <5* 6  --   --   CREATININE 0.48 0.61 0.46  --   CALCIUM 9.4 9.4  --   --   MG  --   --   --  1.7    Recent Labs  06/27/17 0054  AST 34   ALT 29  ALKPHOS 68  BILITOT 1.8*  PROT 7.2  ALBUMIN 3.8   No results for input(s): LIPASE, AMYLASE in the last 72 hours.  Recent Labs  06/26/17 1529  06/27/17 0054 06/27/17 0622  WBC 12.7* 12.4* 12.8*  NEUTROABS 8.1* 5.5  --   HGB 9.4* 9.2* 8.4*  HCT 26.0* 26.1* 24.2*  MCV 82.8 82.6 83.2  PLT 582* 527* 520*     Total time spent including face to face and decision making was greater than 30 minutes  Signed: Douglass Dunshee A. 06/28/2017, 2:02 PM

## 2017-06-28 NOTE — Discharge Summary (Signed)
Carmen [name] MRN: [id number] DOB/AGE: [birth year] 26 y.o.  Admit date: 06/20/2017 Discharge date: 06/28/2017  Primary Care Physician:  [name], MD   Discharge Diagnoses:   Patient Active Problem List   Diagnosis Date Noted  . Hypokalemia 06/27/2017  . 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 Sickle cell anemia with pain ([place]) 05/29/2017  . 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 Hb-SS disease without crisis ([place]) 08/15/2016  . Anemia of chronic disease   . S/P laparoscopic cholecystectomy 11/30/2014  . Thrombocytosis ([place]) 11/22/2014  . Systolic ejection murmur 74/06/1447    DISCHARGE MEDICATION: Not reconciled. Pt left AMA.     Consults:    SIGNIFICANT DIAGNOSTIC STUDIES:  Dg Chest 2 View  Result Date: 06/20/2017 CLINICAL DATA:  Cough congestion and fever. Patient with sickle cell disease. EXAM: CHEST  2 VIEW COMPARISON:  05/28/2017 FINDINGS: Upper limits normal heart size noted. There is no evidence of focal airspace disease, pulmonary edema, suspicious pulmonary nodule/mass, pleural effusion, or pneumothorax. No acute bony abnormalities are identified. IMPRESSION: Upper limits normal heart size without evidence of acute cardiopulmonary disease. Electronically Signed   By: Margarette Canada M.D.   On: 06/20/2017 15:33     Recent Results (from the past 240 hour(s))  Rapid strep screen     Status: None   Collection Time: 06/20/17  8:38 PM  Result Value Ref Range Status   Streptococcus, Group A Screen (Direct) NEGATIVE NEGATIVE Final    Comment: (NOTE) A Rapid Antigen test may result negative if the antigen level in the sample is below the detection level of this test. The FDA has not cleared this test as a stand-alone test therefore the rapid antigen negative result has reflexed to a Group A Strep culture.   Culture, group A strep     Status: None   Collection Time: 06/20/17  8:38 PM  Result Value  Ref Range Status   Specimen Description THROAT  Final   Special Requests NONE Reflexed from [id number]  Final   Culture MODERATE STREPTOCOCCUS,BETA HEMOLYTIC NOT GROUP A  Final   Report Status 06/23/2017 FINAL  Final  Rapid strep screen (not at [facility])     Status: None   Collection Time: 06/21/17  4:45 PM  Result Value Ref Range Status   Streptococcus, Group A Screen (Direct) NEGATIVE NEGATIVE Final    Comment: (NOTE) A Rapid Antigen test may result negative if the antigen level in the sample is below the detection level of this test. The FDA has not cleared this test as a stand-alone test therefore the rapid antigen negative result has reflexed to a Group A Strep culture.   Culture, group A strep     Status: None   Collection Time: 06/21/17  4:45 PM  Result Value Ref Range Status   Specimen Description THROAT  Final   Special Requests NONE Reflexed from [id number]  Final   Culture MODERATE STREPTOCOCCUS,BETA HEMOLYTIC NOT GROUP A  Final   Report Status 06/23/2017 FINAL  Final  Culture, expectorated sputum-assessment     Status: None   Collection Time: 06/22/17  2:29 AM  Result Value Ref Range Status   Specimen Description SPUTUM  Final   Special Requests Normal  Final   Sputum evaluation THIS SPECIMEN IS ACCEPTABLE FOR SPUTUM CULTURE  Final   Report Status 06/22/2017 FINAL  Final  Culture, respiratory (NON-Expectorated)     Status: None   Collection Time: 06/22/17  2:29  AM  Result Value Ref Range Status   Specimen Description SPUTUM  Final   Special Requests Normal Reflexed from [id number]  Final   Gram Stain   Final    ABUNDANT WBC PRESENT, PREDOMINANTLY PMN ABUNDANT GRAM POSITIVE COCCI IN CHAINS MODERATE GRAM POSITIVE COCCI IN CLUSTERS MODERATE GRAM NEGATIVE RODS    Culture   Final    Consistent with normal respiratory flora. Performed at [facility], [place] [address] [place], [place] [address]    Report Status 06/24/2017 FINAL  Final    BRIEF ADMITTING H & P: HPI: [name] is a 26 y.o. female with medical history significant of HGB SS disease, frequent pain crises.  Patent presents to ED with pain crisis.  Pain throughout body and chest.  No SOB.  Pain like prior crises.   ED Course: Pain persists despite loading doses of IV dilaudid. Does have fever of 101.8 today as well.  WBC 16.8 up from 9.2 just last evening.  UA neg, CXR neg.  HGB 9.1 down from 9.3 yesterday.  Looks like 9 is about her baseline.  Patient does have productive cough with greenish sputum.  Strep test also negative   Hospital Course:  This is an opiate tolerant patient with Hb SS who was admitted with sickle cell crisis, Pharyngitis, Constipation and Fever. She was treated for the sickle cell crisis with Dilaudid via the PCA, Toradol and IVF. As the pain improved, transition was made to Oxycodone. She left AMA for personal reasons before treatment was concluded.   Pt also presented with a Pharyngitis with fever. Throat cultures were negative for Beta-hemolytic Group A strept. She was treated symptomatically and symptoms resolved.  Fever also was self-limited.   Pt also had constipation and was managed with Magnesium Citrate in addition to Senna-S with good results.   She was continued on MS Contin for chronic pain.    Disposition and Follow-up:  Pt left the hospital AGAINST MEDICAL ADVICE due to personal reasons.   DISCHARGE EXAM: Not performed as patient left AGAINST MEDICAL ADVICE.    Total time spent including face to face and decision making was less than 30 minutes  Signed: Ivori Storr A. 06/28/2017, 7:09 PM

## 2017-07-02 ENCOUNTER — Encounter (HOSPITAL_COMMUNITY): Payer: Self-pay

## 2017-07-02 ENCOUNTER — Emergency Department (HOSPITAL_COMMUNITY)
Admission: EM | Admit: 2017-07-02 | Discharge: 2017-07-02 | Disposition: A | Discharge status: Home / Self Care | Payer: Medicaid Other | Attending: Emergency Medicine | Admitting: Emergency Medicine

## 2017-07-02 DIAGNOSIS — D57219 Sickle-cell/Hb-C disease with crisis, unspecified: Secondary | ICD-10-CM | POA: Diagnosis not present

## 2017-07-02 DIAGNOSIS — Z7982 Long term (current) use of aspirin: Secondary | ICD-10-CM | POA: Insufficient documentation

## 2017-07-02 DIAGNOSIS — D5700 Hb-SS disease with crisis, unspecified: Secondary | ICD-10-CM

## 2017-07-02 DIAGNOSIS — G894 Chronic pain syndrome: Secondary | ICD-10-CM

## 2017-07-02 LAB — CBC WITH DIFFERENTIAL/PLATELET
Basophils Absolute: 0 10*3/uL (ref 0.0–0.1)
Basophils Relative: 0 %
EOS ABS: 0.2 10*3/uL (ref 0.0–0.7)
Eosinophils Relative: 2 %
HEMATOCRIT: 26 % — AB (ref 36.0–46.0)
HEMOGLOBIN: 9.4 g/dL — AB (ref 12.0–15.0)
LYMPHS ABS: 5 10*3/uL — AB (ref 0.7–4.0)
Lymphocytes Relative: 38 %
MCH: 30 pg (ref 26.0–34.0)
MCHC: 36.2 g/dL — AB (ref 30.0–36.0)
MCV: 83.1 fL (ref 78.0–100.0)
MONOS PCT: 12 %
Monocytes Absolute: 1.6 10*3/uL — ABNORMAL HIGH (ref 0.1–1.0)
NEUTROS ABS: 6.3 10*3/uL (ref 1.7–7.7)
NEUTROS PCT: 48 %
Platelets: 356 10*3/uL (ref 150–400)
RBC: 3.13 MIL/uL — ABNORMAL LOW (ref 3.87–5.11)
RDW: 16.6 % — ABNORMAL HIGH (ref 11.5–15.5)
WBC: 13.2 10*3/uL — ABNORMAL HIGH (ref 4.0–10.5)

## 2017-07-02 LAB — COMPREHENSIVE METABOLIC PANEL
ALBUMIN: 4 g/dL (ref 3.5–5.0)
ALT: 20 U/L (ref 14–54)
AST: 22 U/L (ref 15–41)
Alkaline Phosphatase: 64 U/L (ref 38–126)
Anion gap: 6 (ref 5–15)
BUN: 10 mg/dL (ref 6–20)
CHLORIDE: 108 mmol/L (ref 101–111)
CO2: 27 mmol/L (ref 22–32)
CREATININE: 0.59 mg/dL (ref 0.44–1.00)
Calcium: 9.2 mg/dL (ref 8.9–10.3)
GFR calc non Af Amer: >60 mL/min (ref >60)
Glucose, Bld: 99 mg/dL (ref 65–99)
Potassium: 4 mmol/L (ref 3.5–5.1)
SODIUM: 141 mmol/L (ref 135–145)
Total Bilirubin: 1.5 mg/dL — ABNORMAL HIGH (ref 0.3–1.2)
Total Protein: 7.7 g/dL (ref 6.5–8.1)

## 2017-07-02 LAB — RETICULOCYTES
RBC.: 3.13 MIL/uL — ABNORMAL LOW (ref 3.87–5.11)
Retic Count, Absolute: 234.8 10*3/uL — ABNORMAL HIGH (ref 19.0–186.0)
Retic Ct Pct: 7.5 % — ABNORMAL HIGH (ref 0.4–3.1)

## 2017-07-02 MED ORDER — HYDROMORPHONE HCL 1 MG/ML IJ SOLN: 2.0000 mg | INTRAMUSCULAR | Status: AC

## 2017-07-02 MED ORDER — HYDROMORPHONE HCL 1 MG/ML IJ SOLN
2.0000 mg | INTRAMUSCULAR | Status: AC
  Administered 2017-07-02: 2 mg via INTRAVENOUS
  Filled 2017-07-02: qty 2

## 2017-07-02 MED ORDER — DIPHENHYDRAMINE HCL 25 MG PO CAPS: 25.0000 mg | ORAL_CAPSULE | ORAL | Status: DC | PRN

## 2017-07-02 MED ORDER — ONDANSETRON HCL 4 MG/2ML IJ SOLN: 4.0000 mg | INTRAMUSCULAR | Status: DC | PRN

## 2017-07-02 MED ORDER — HYDROMORPHONE HCL 1 MG/ML IJ SOLN
2.0000 mg | INTRAMUSCULAR | Status: AC
  Administered 2017-07-02 (×2): 2 mg via INTRAVENOUS
  Filled 2017-07-02 (×2): qty 2

## 2017-07-02 NOTE — ED Notes (Signed)
Unable to get IV access yet. IV team and ED RN has tried multiple times. Offered SQ diluadid to patient but refuses and would like to wait for IV access.

## 2017-07-02 NOTE — ED Provider Notes (Signed)
[facility] DEPT Provider Note   CSN: [id number] Arrival date & time: 07/02/17  0218     History   Chief Complaint Chief Complaint  Patient presents with  . Sickle Cell Pain Crisis    HPI Carmen [name] is a 26 y.o. female.  Patient presents to the ED with a chief complaint of leg pain and back pain.  She states that her symptoms began yesterday.  She is out of her home oxycodone.  She denies any fevers, chills, cough, chest pain, or SOB.  She states that this is her typical pain.  She states that she is scheduled to get her oxycodone refilled tomorrow.  She denies any other associated symptoms. There are no modifying factors.   The history is provided by the patient. No language interpreter was used.    Past Medical History:  Diagnosis Date  . Abortion    05/2012  . Headache(784.0)   . Sickle cell crisis [facility])     Patient Active Problem List   Diagnosis Date Noted  . Hypokalemia 06/27/2017  . 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 Sickle cell anemia with pain ([place]) 05/29/2017  . 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 Hb-SS disease without crisis ([place]) 08/15/2016  . Anemia of chronic disease   . S/P laparoscopic cholecystectomy 11/30/2014  . Thrombocytosis ([place]) 11/22/2014  . Systolic ejection murmur 21/19/4174    Past Surgical History:  Procedure Laterality Date  . CHOLECYSTECTOMY N/A 11/30/2014   Procedure: LAPAROSCOPIC CHOLECYSTECTOMY SINGLE SITE WITH INTRAOPERATIVE CHOLANGIOGRAM;  Surgeon: Michael Boston, MD;  Location: [facility] ORS;  Service: General;  Laterality: N/A;  . SPLENECTOMY      OB History    Gravida Para Term Preterm AB Living   1       1     SAB TAB Ectopic Multiple Live Births                   Home Medications    Prior to Admission medications   Medication Sig Start Date End Date Taking? Authorizing Provider  aspirin EC 81 MG tablet Take 1 tablet (81 mg total) by mouth  daily. 05/31/17   Leana Gamer, MD  ibuprofen (ADVIL,MOTRIN) 600 MG tablet Take 1 tablet (600 mg total) by mouth every 6 (six) hours as needed. 06/28/17   Leana Gamer, MD  morphine (MS CONTIN) 60 MG 12 hr tablet Take 1 tablet (60 mg total) by mouth every 12 (twelve) hours. 05/31/17   Leana Gamer, MD  oxyCODONE-acetaminophen (PERCOCET) 10-325 MG tablet Take 1 tablet by mouth every 4 (four) hours as needed for severe pain. 05/09/17   [provider]    Family History Family History  Problem Relation Age of Onset  . Hypertension Mother   . Sickle cell anemia Sister   . Kidney disease Sister        Lupus  . Arthritis Sister   . Sickle cell anemia Sister   . Sickle cell trait Sister   . Heart disease Maternal Aunt        CABG  . Heart disease Maternal Uncle        CABG  . Lupus Sister     Social History Social History  Substance Use Topics  . Smoking status: Never Smoker  . Smokeless tobacco: Never Used  . Alcohol use No     Allergies   Patient has no known allergies.   Review  of Systems Review of Systems  All other systems reviewed and are negative.    Physical Exam Updated Vital Signs BP 122/86 (BP Location: Right Arm)   Pulse 86   Temp 98.7 F (37.1 C) (Oral)   Resp 18   Ht 5\' 3"  (1.6 m)   Wt 77.1 kg (170 lb)   LMP 06/05/2017 (Approximate)   SpO2 99%   BMI 30.11 kg/m   Physical Exam  Constitutional: She is oriented to person, place, and time. She appears well-developed and well-nourished.  HENT:  Head: Normocephalic and atraumatic.  Eyes: Pupils are equal, round, and reactive to light. Conjunctivae and EOM are normal.  Neck: Normal range of motion. Neck supple.  Cardiovascular: Normal rate and regular rhythm.  Exam reveals no gallop and no friction rub.   No murmur heard. Pulmonary/Chest: Effort normal and breath sounds normal. No respiratory distress. She has no wheezes. She has no rales. She exhibits no tenderness.  CTAB    Abdominal: Soft. Bowel sounds are normal. She exhibits no distension and no mass. There is no tenderness. There is no rebound and no guarding.  Musculoskeletal: Normal range of motion. She exhibits no edema or tenderness.  Neurological: She is alert and oriented to person, place, and time.  Skin: Skin is warm and dry.  No rash, no cellulitis  Psychiatric: She has a normal mood and affect. Her behavior is normal. Judgment and thought content normal.  Nursing note and vitals reviewed.    ED Treatments / Results  Labs (all labs ordered are listed, but only abnormal results are displayed) Labs Reviewed  COMPREHENSIVE METABOLIC PANEL  CBC WITH DIFFERENTIAL/PLATELET  RETICULOCYTES    EKG  EKG Interpretation None       Radiology No results found.  Procedures Procedures (including critical care time)  Medications Ordered in ED Medications  HYDROmorphone (DILAUDID) injection 2 mg (not administered)    Or  HYDROmorphone (DILAUDID) injection 2 mg (not administered)  HYDROmorphone (DILAUDID) injection 2 mg (not administered)    Or  HYDROmorphone (DILAUDID) injection 2 mg (not administered)  HYDROmorphone (DILAUDID) injection 2 mg (not administered)    Or  HYDROmorphone (DILAUDID) injection 2 mg (not administered)  HYDROmorphone (DILAUDID) injection 2 mg (not administered)    Or  HYDROmorphone (DILAUDID) injection 2 mg (not administered)  diphenhydrAMINE (BENADRYL) capsule 25-50 mg (not administered)  ondansetron (ZOFRAN) injection 4 mg (not administered)     Initial Impression / Assessment and Plan / ED Course  I have reviewed the triage vital signs and the nursing notes.  Pertinent labs & imaging results that were available during my care of the patient were reviewed by me and considered in my medical decision making (see chart for details).     Patient with sickle cell pain.  Will check labs, treat pain, and reassess.  Patient is out of her home oxycodone.  She is  scheduled to get more on Sunday.  After several rounds of pain medicine, patient states that she feels like she could go home after one more dose.  Final Clinical Impressions(s) / ED Diagnoses   Final diagnoses:  Chronic pain syndrome  Sickle cell anemia with pain [facility])    New Prescriptions New Prescriptions   No medications on file     Montine Circle, Hershal Coria 07/02/17 [id number]    [place], Jenny Reichmann, MD 07/02/17 0700

## 2017-07-02 NOTE — ED Triage Notes (Signed)
Pt presents with c/o sickle cell pain in her right leg and back that started last night.

## 2017-07-08 ENCOUNTER — Other Ambulatory Visit: Payer: Medicaid Other

## 2017-07-12 ENCOUNTER — Ambulatory Visit
Admission: RE | Admit: 2017-07-12 | Discharge: 2017-07-12 | Disposition: A | Discharge status: Home / Self Care | Payer: Self-pay | Source: Ambulatory Visit | Attending: Family Medicine | Admitting: Family Medicine

## 2017-07-12 ENCOUNTER — Other Ambulatory Visit: Payer: Self-pay | Admitting: Family Medicine

## 2017-07-12 DIAGNOSIS — N6310 Unspecified lump in the right breast, unspecified quadrant: Secondary | ICD-10-CM

## 2017-07-16 ENCOUNTER — Emergency Department (HOSPITAL_COMMUNITY)
Admission: EM | Admit: 2017-07-16 | Discharge: 2017-07-16 | Disposition: A | Discharge status: Home / Self Care | Payer: Medicaid Other | Source: Home / Self Care | Attending: Emergency Medicine | Admitting: Emergency Medicine

## 2017-07-16 ENCOUNTER — Encounter (HOSPITAL_COMMUNITY): Payer: Self-pay | Admitting: Emergency Medicine

## 2017-07-16 ENCOUNTER — Emergency Department (HOSPITAL_COMMUNITY): Payer: Medicaid Other

## 2017-07-16 DIAGNOSIS — D5700 Hb-SS disease with crisis, unspecified: Secondary | ICD-10-CM | POA: Insufficient documentation

## 2017-07-16 DIAGNOSIS — Z7982 Long term (current) use of aspirin: Secondary | ICD-10-CM | POA: Insufficient documentation

## 2017-07-16 DIAGNOSIS — Z79899 Other long term (current) drug therapy: Secondary | ICD-10-CM | POA: Insufficient documentation

## 2017-07-16 LAB — COMPREHENSIVE METABOLIC PANEL
ALT: 16 U/L (ref 14–54)
ANION GAP: 8 (ref 5–15)
AST: 33 U/L (ref 15–41)
Albumin: 4.2 g/dL (ref 3.5–5.0)
Alkaline Phosphatase: 57 U/L (ref 38–126)
BUN: 9 mg/dL (ref 6–20)
CALCIUM: 9.4 mg/dL (ref 8.9–10.3)
CHLORIDE: 108 mmol/L (ref 101–111)
CO2: 24 mmol/L (ref 22–32)
Creatinine, Ser: 0.52 mg/dL (ref 0.44–1.00)
GFR calc non Af Amer: >60 mL/min (ref >60)
Glucose, Bld: 105 mg/dL — ABNORMAL HIGH (ref 65–99)
Potassium: 4 mmol/L (ref 3.5–5.1)
SODIUM: 140 mmol/L (ref 135–145)
Total Bilirubin: 1.7 mg/dL — ABNORMAL HIGH (ref 0.3–1.2)
Total Protein: 7.8 g/dL (ref 6.5–8.1)

## 2017-07-16 LAB — RETICULOCYTES
RBC.: 3.19 MIL/uL — ABNORMAL LOW (ref 3.87–5.11)
RETIC COUNT ABSOLUTE: 213.7 10*3/uL — AB (ref 19.0–186.0)
RETIC CT PCT: 6.7 % — AB (ref 0.4–3.1)

## 2017-07-16 LAB — CBC WITH DIFFERENTIAL/PLATELET
BASOS PCT: 0 %
Basophils Absolute: 0 10*3/uL (ref 0.0–0.1)
Eosinophils Absolute: 0.1 10*3/uL (ref 0.0–0.7)
Eosinophils Relative: 1 %
HEMATOCRIT: 26.2 % — AB (ref 36.0–46.0)
HEMOGLOBIN: 9.4 g/dL — AB (ref 12.0–15.0)
LYMPHS ABS: 3.7 10*3/uL (ref 0.7–4.0)
Lymphocytes Relative: 40 %
MCH: 29.5 pg (ref 26.0–34.0)
MCHC: 35.9 g/dL (ref 30.0–36.0)
MCV: 82.1 fL (ref 78.0–100.0)
MONOS PCT: 11 %
Monocytes Absolute: 1 10*3/uL (ref 0.1–1.0)
NEUTROS ABS: 4.4 10*3/uL (ref 1.7–7.7)
NEUTROS PCT: 48 %
Platelets: 760 10*3/uL — ABNORMAL HIGH (ref 150–400)
RBC: 3.19 MIL/uL — AB (ref 3.87–5.11)
RDW: 15.3 % (ref 11.5–15.5)
WBC: 9.2 10*3/uL (ref 4.0–10.5)

## 2017-07-16 MED ORDER — ONDANSETRON HCL 4 MG/2ML IJ SOLN: 4.0000 mg | INTRAMUSCULAR | Status: DC | PRN

## 2017-07-16 MED ORDER — KETOROLAC TROMETHAMINE 30 MG/ML IJ SOLN
30.0000 mg | INTRAMUSCULAR | Status: AC
  Administered 2017-07-16: 30 mg via INTRAVENOUS
  Filled 2017-07-16: qty 1

## 2017-07-16 MED ORDER — HYDROMORPHONE HCL 1 MG/ML IJ SOLN: 2.0000 mg | INTRAMUSCULAR | Status: DC

## 2017-07-16 MED ORDER — HYDROMORPHONE HCL 1 MG/ML IJ SOLN: 2.0000 mg | INTRAMUSCULAR | Status: AC

## 2017-07-16 MED ORDER — HYDROMORPHONE HCL 1 MG/ML IJ SOLN
2.0000 mg | INTRAMUSCULAR | Status: AC
  Administered 2017-07-16: 2 mg via INTRAVENOUS
  Filled 2017-07-16: qty 2

## 2017-07-16 MED ORDER — DIPHENHYDRAMINE HCL 25 MG PO CAPS: 25.0000 mg | ORAL_CAPSULE | ORAL | Status: DC | PRN

## 2017-07-16 MED ORDER — SODIUM CHLORIDE 0.45 % IV SOLN
INTRAVENOUS | Status: DC
  Administered 2017-07-16: 12:00:00 via INTRAVENOUS
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
## 2017-07-16 NOTE — Discharge Instructions (Signed)
Continue your current medications, follow-up with your sickle cell doctor
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
## 2017-07-17 ENCOUNTER — Inpatient Hospital Stay (HOSPITAL_COMMUNITY)
Admission: EM | Admit: 2017-07-17 | Discharge: 2017-07-20 | DRG: 812 | Disposition: A | Discharge status: Home / Self Care | Payer: Medicaid Other | Attending: Internal Medicine | Admitting: Internal Medicine

## 2017-07-17 ENCOUNTER — Encounter (HOSPITAL_COMMUNITY): Payer: Self-pay | Admitting: Emergency Medicine

## 2017-07-17 DIAGNOSIS — G894 Chronic pain syndrome: Secondary | ICD-10-CM | POA: Diagnosis not present

## 2017-07-17 DIAGNOSIS — Z841 Family history of disorders of kidney and ureter: Secondary | ICD-10-CM | POA: Diagnosis not present

## 2017-07-17 DIAGNOSIS — Z8261 Family history of arthritis: Secondary | ICD-10-CM | POA: Diagnosis not present

## 2017-07-17 DIAGNOSIS — R7989 Other specified abnormal findings of blood chemistry: Secondary | ICD-10-CM | POA: Diagnosis present

## 2017-07-17 DIAGNOSIS — D5700 Hb-SS disease with crisis, unspecified: Secondary | ICD-10-CM | POA: Diagnosis present

## 2017-07-17 DIAGNOSIS — D638 Anemia in other chronic diseases classified elsewhere: Secondary | ICD-10-CM | POA: Diagnosis not present

## 2017-07-17 DIAGNOSIS — Z832 Family history of diseases of the blood and blood-forming organs and certain disorders involving the immune mechanism: Secondary | ICD-10-CM | POA: Diagnosis not present

## 2017-07-17 DIAGNOSIS — Z7982 Long term (current) use of aspirin: Secondary | ICD-10-CM | POA: Diagnosis not present

## 2017-07-17 DIAGNOSIS — Z8249 Family history of ischemic heart disease and other diseases of the circulatory system: Secondary | ICD-10-CM | POA: Diagnosis not present

## 2017-07-17 DIAGNOSIS — F603 Borderline personality disorder: Secondary | ICD-10-CM | POA: Diagnosis present

## 2017-07-17 DIAGNOSIS — Z23 Encounter for immunization: Secondary | ICD-10-CM

## 2017-07-17 DIAGNOSIS — F449 Dissociative and conversion disorder, unspecified: Secondary | ICD-10-CM | POA: Diagnosis present

## 2017-07-17 DIAGNOSIS — R110 Nausea: Secondary | ICD-10-CM | POA: Diagnosis not present

## 2017-07-17 LAB — CBC WITH DIFFERENTIAL/PLATELET
Basophils Absolute: 0 10*3/uL (ref 0.0–0.1)
Basophils Relative: 0 %
EOS PCT: 1 %
Eosinophils Absolute: 0.1 10*3/uL (ref 0.0–0.7)
HCT: 26.5 % — ABNORMAL LOW (ref 36.0–46.0)
Hemoglobin: 9.5 g/dL — ABNORMAL LOW (ref 12.0–15.0)
LYMPHS ABS: 3.3 10*3/uL (ref 0.7–4.0)
LYMPHS PCT: 37 %
MCH: 29.5 pg (ref 26.0–34.0)
MCHC: 35.8 g/dL (ref 30.0–36.0)
MCV: 82.3 fL (ref 78.0–100.0)
MONO ABS: 1 10*3/uL (ref 0.1–1.0)
Monocytes Relative: 11 %
Neutro Abs: 4.6 10*3/uL (ref 1.7–7.7)
Neutrophils Relative %: 51 %
PLATELETS: 681 10*3/uL — AB (ref 150–400)
RBC: 3.22 MIL/uL — ABNORMAL LOW (ref 3.87–5.11)
RDW: 15.2 % (ref 11.5–15.5)
WBC: 9.1 10*3/uL (ref 4.0–10.5)

## 2017-07-17 LAB — COMPREHENSIVE METABOLIC PANEL
ALBUMIN: 4.1 g/dL (ref 3.5–5.0)
ALK PHOS: 62 U/L (ref 38–126)
ALT: 12 U/L — ABNORMAL LOW (ref 14–54)
AST: 23 U/L (ref 15–41)
Anion gap: 8 (ref 5–15)
BILIRUBIN TOTAL: 1.4 mg/dL — AB (ref 0.3–1.2)
BUN: 15 mg/dL (ref 6–20)
CO2: 22 mmol/L (ref 22–32)
Calcium: 9.7 mg/dL (ref 8.9–10.3)
Chloride: 110 mmol/L (ref 101–111)
Creatinine, Ser: 0.57 mg/dL (ref 0.44–1.00)
GFR calc Af Amer: >60 mL/min (ref >60)
GLUCOSE: 93 mg/dL (ref 65–99)
POTASSIUM: 4.2 mmol/L (ref 3.5–5.1)
Sodium: 140 mmol/L (ref 135–145)
TOTAL PROTEIN: 8.1 g/dL (ref 6.5–8.1)

## 2017-07-17 LAB — RETICULOCYTES
RBC.: 3.22 MIL/uL — ABNORMAL LOW (ref 3.87–5.11)
Retic Count, Absolute: 180.3 10*3/uL (ref 19.0–186.0)
Retic Ct Pct: 5.6 % — ABNORMAL HIGH (ref 0.4–3.1)

## 2017-07-17 MED ORDER — HYDROMORPHONE HCL 1 MG/ML IJ SOLN
2.0000 mg | INTRAMUSCULAR | Status: AC
Start: 2017-07-17 — End: 2017-07-17

## 2017-07-17 MED ORDER — NALOXONE HCL 0.4 MG/ML IJ SOLN: 0.4000 mg | INTRAMUSCULAR | Status: DC | PRN

## 2017-07-17 MED ORDER — SENNOSIDES-DOCUSATE SODIUM 8.6-50 MG PO TABS
1.0000 | ORAL_TABLET | Freq: Two times a day (BID) | ORAL | Status: DC
  Administered 2017-07-17 – 2017-07-20 (×4): 1 via ORAL
  Filled 2017-07-17 (×5): qty 1

## 2017-07-17 MED ORDER — DIPHENHYDRAMINE HCL 50 MG/ML IJ SOLN
25.0000 mg | INTRAMUSCULAR | Status: DC | PRN
  Filled 2017-07-17: qty 0.5

## 2017-07-17 MED ORDER — ENOXAPARIN SODIUM 40 MG/0.4ML ~~LOC~~ SOLN
40.0000 mg | SUBCUTANEOUS | Status: DC
  Administered 2017-07-17 – 2017-07-19 (×2): 40 mg via SUBCUTANEOUS
  Filled 2017-07-17 (×3): qty 0.4

## 2017-07-17 MED ORDER — DIPHENHYDRAMINE HCL 25 MG PO CAPS: 25.0000 mg | ORAL_CAPSULE | ORAL | Status: DC | PRN

## 2017-07-17 MED ORDER — HYDROMORPHONE HCL 1 MG/ML IJ SOLN
2.0000 mg | INTRAMUSCULAR | Status: AC
Start: 2017-07-17 — End: 2017-07-17
  Administered 2017-07-17: 2 mg via INTRAVENOUS
  Filled 2017-07-17: qty 2

## 2017-07-17 MED ORDER — SODIUM CHLORIDE 0.9% FLUSH: 9.0000 mL | INTRAVENOUS | Status: DC | PRN

## 2017-07-17 MED ORDER — HYDROMORPHONE HCL 1 MG/ML IJ SOLN
2.0000 mg | INTRAMUSCULAR | Status: AC
  Administered 2017-07-17: 2 mg via INTRAVENOUS
  Filled 2017-07-17: qty 2

## 2017-07-17 MED ORDER — KETOROLAC TROMETHAMINE 30 MG/ML IJ SOLN
30.0000 mg | INTRAMUSCULAR | Status: AC
  Administered 2017-07-17: 30 mg via INTRAVENOUS
  Filled 2017-07-17: qty 1

## 2017-07-17 MED ORDER — DEXTROSE-NACL 5-0.45 % IV SOLN
INTRAVENOUS | Status: DC
  Administered 2017-07-17 – 2017-07-18 (×4): via INTRAVENOUS
  Administered 2017-07-19: 1000 mL via INTRAVENOUS
  Administered 2017-07-19: 03:00:00 via INTRAVENOUS

## 2017-07-17 MED ORDER — ASPIRIN EC 81 MG PO TBEC
81.0000 mg | DELAYED_RELEASE_TABLET | Freq: Every day | ORAL | Status: DC
  Administered 2017-07-18 – 2017-07-20 (×3): 81 mg via ORAL
  Filled 2017-07-17 (×4): qty 1

## 2017-07-17 MED ORDER — ONDANSETRON HCL 4 MG/2ML IJ SOLN: 4.0000 mg | Freq: Four times a day (QID) | INTRAMUSCULAR | Status: DC | PRN

## 2017-07-17 MED ORDER — MORPHINE SULFATE ER 30 MG PO TBCR
60.0000 mg | EXTENDED_RELEASE_TABLET | Freq: Two times a day (BID) | ORAL | Status: DC
Start: 2017-07-17 — End: 2017-07-20
  Administered 2017-07-17 – 2017-07-20 (×6): 60 mg via ORAL
  Filled 2017-07-17 (×6): qty 2

## 2017-07-17 MED ORDER — HYDROMORPHONE 1 MG/ML IV SOLN
INTRAVENOUS | Status: DC
  Administered 2017-07-17: 17:00:00 via INTRAVENOUS
  Administered 2017-07-17: 6.5 mg via INTRAVENOUS
  Administered 2017-07-17: 6 mg via INTRAVENOUS
  Administered 2017-07-18: 1.5 mg via INTRAVENOUS
  Administered 2017-07-18: 6 mg via INTRAVENOUS
  Filled 2017-07-17 (×3): qty 25

## 2017-07-17 MED ORDER — ONDANSETRON HCL 4 MG/2ML IJ SOLN
4.0000 mg | INTRAMUSCULAR | Status: DC | PRN
  Administered 2017-07-17 – 2017-07-19 (×4): 4 mg via INTRAVENOUS
  Filled 2017-07-17 (×4): qty 2

## 2017-07-17 MED ORDER — HYDROMORPHONE HCL 1 MG/ML IJ SOLN
2.0000 mg | INTRAMUSCULAR | Status: AC
Start: 2017-07-17 — End: 2017-07-17
  Administered 2017-07-17: 2 mg via INTRAVENOUS
  Filled 2017-07-17 (×2): qty 2

## 2017-07-17 MED ORDER — HYDROMORPHONE HCL 1 MG/ML IJ SOLN: 2.0000 mg | INTRAMUSCULAR | Status: AC

## 2017-07-17 MED ORDER — KETOROLAC TROMETHAMINE 30 MG/ML IJ SOLN
30.0000 mg | Freq: Four times a day (QID) | INTRAMUSCULAR | Status: DC
  Administered 2017-07-17 – 2017-07-20 (×12): 30 mg via INTRAVENOUS
  Filled 2017-07-17 (×12): qty 1

## 2017-07-17 MED ORDER — INFLUENZA VAC SPLIT QUAD 0.5 ML IM SUSY
0.5000 mL | PREFILLED_SYRINGE | INTRAMUSCULAR | Status: AC
  Administered 2017-07-18: 0.5 mL via INTRAMUSCULAR
  Filled 2017-07-17: qty 0.5

## 2017-07-17 MED ORDER — SODIUM CHLORIDE 0.45 % IV SOLN
INTRAVENOUS | Status: DC
  Administered 2017-07-17: 08:00:00 via INTRAVENOUS

## 2017-07-17 MED ORDER — POLYETHYLENE GLYCOL 3350 17 G PO PACK: 17.0000 g | PACK | Freq: Every day | ORAL | Status: DC | PRN

## 2017-07-17 NOTE — Progress Notes (Signed)
Pt refused to have vital signs taken by the tech and started swearing at her to get out of her room. I went in to her room to talk to her and she started screaming at me to get out of her room. Dr. Jonelle Sidle called and told of the situation. AC also contacted. She stated she would be up shortly. Dr. Jonelle Sidle ordered pt to be transferred to a telemetry floor-pt complaining of chest pain.

## 2017-07-17 NOTE — Progress Notes (Signed)
AC her to speak with pt.

## 2017-07-17 NOTE — ED Provider Notes (Signed)
[facility] DEPT Provider Note   CSN: [id number] Arrival date & time: 07/17/17  0520     History   Chief Complaint Chief Complaint  Patient presents with  . Sickle Cell Pain Crisis    HPI Carmen [name] is a 26 y.o. female.  HPI Patient presents with sickle cell pain. History of multiple visits for same. This is her 45th visit in the last 6 months. She was seen in the ER yesterday. States at that time her pain got relieved with the medicines but then came back. States she is hurting in her back and arms and but now hurts everywhere. States she's taken her medicines at home without relief. Get seen by Dr. Noah Delaine. No fevers. No chest pain. No Trouble breathing. Past Medical History:  Diagnosis Date  . Abortion    05/2012  . Headache(784.0)   . Sickle cell crisis [facility])     Patient Active Problem List   Diagnosis Date Noted  . Hypokalemia 06/27/2017  . 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 Sickle cell anemia with pain ([place]) 05/29/2017  . 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 Hb-SS disease without crisis ([place]) 08/15/2016  . Anemia of chronic disease   . S/P laparoscopic cholecystectomy 11/30/2014  . Thrombocytosis ([place]) 11/22/2014  . Systolic ejection murmur 09/98/3382    Past Surgical History:  Procedure Laterality Date  . CHOLECYSTECTOMY N/A 11/30/2014   Procedure: LAPAROSCOPIC CHOLECYSTECTOMY SINGLE SITE WITH INTRAOPERATIVE CHOLANGIOGRAM;  Surgeon: Michael Boston, MD;  Location: [facility] ORS;  Service: General;  Laterality: N/A;  . SPLENECTOMY      OB History    Gravida Para Term Preterm AB Living   1       1     SAB TAB Ectopic Multiple Live Births                   Home Medications    Prior to Admission medications   Medication Sig Start Date End Date Taking? Authorizing Provider  aspirin EC 81 MG tablet Take 1 tablet (81 mg total) by mouth daily. 05/31/17  Yes Leana Gamer, MD    ibuprofen (ADVIL,MOTRIN) 600 MG tablet Take 1 tablet (600 mg total) by mouth every 6 (six) hours as needed. Patient taking differently: Take 600 mg by mouth every 6 (six) hours as needed for headache, mild pain or moderate pain.  06/28/17  Yes Leana Gamer, MD  morphine (MS CONTIN) 60 MG 12 hr tablet Take 1 tablet (60 mg total) by mouth every 12 (twelve) hours. 05/31/17  Yes Leana Gamer, MD  oxyCODONE-acetaminophen (PERCOCET) 10-325 MG tablet Take 1 tablet by mouth every 4 (four) hours as needed for severe pain. 05/09/17  Yes [provider]    Family History Family History  Problem Relation Age of Onset  . Hypertension Mother   . Sickle cell anemia Sister   . Kidney disease Sister        Lupus  . Arthritis Sister   . Sickle cell anemia Sister   . Sickle cell trait Sister   . Heart disease Maternal Aunt        CABG  . Heart disease Maternal Uncle        CABG  . Lupus Sister     Social History Social History  Substance Use Topics  . Smoking status: Never Smoker  . Smokeless tobacco: Never Used  . Alcohol use No  Allergies   Patient has no known allergies.   Review of Systems Review of Systems  Constitutional: Positive for appetite change and chills. Negative for fever.  HENT: Negative for congestion.   Respiratory: Negative for shortness of breath.   Cardiovascular: Negative for chest pain.  Gastrointestinal: Negative for abdominal pain.  Genitourinary: Negative for flank pain and urgency.  Musculoskeletal: Positive for myalgias.  Neurological: Negative for tremors and seizures.  Hematological: Negative for adenopathy.  Psychiatric/Behavioral: Negative for confusion. The patient is not hyperactive.      Physical Exam Updated Vital Signs BP (!) 119/101   Pulse 79   Temp 98.6 F (37 C) (Oral)   Resp 15   Ht 5\' 3"  (1.6 m)   Wt 77.1 kg (170 lb)   LMP 07/06/2017   SpO2 99%   BMI 30.11 kg/m   Physical Exam  Constitutional: She  appears well-developed.  HENT:  Head: Atraumatic.  Eyes: Pupils are equal, round, and reactive to light.  Neck: Neck supple.  Pulmonary/Chest: Effort normal.  Abdominal: There is no tenderness.  Neurological: She is alert.  Skin: Skin is warm. Capillary refill takes less than 2 seconds.  Psychiatric: She has a normal mood and affect.     ED Treatments / Results  Labs (all labs ordered are listed, but only abnormal results are displayed) Labs Reviewed  COMPREHENSIVE METABOLIC PANEL - Abnormal; Notable for the following:       Result Value   ALT 12 (*)    Total Bilirubin 1.4 (*)    All other components within normal limits  CBC WITH DIFFERENTIAL/PLATELET - Abnormal; Notable for the following:    RBC 3.22 (*)    Hemoglobin 9.5 (*)    HCT 26.5 (*)    Platelets 681 (*)    All other components within normal limits  RETICULOCYTES - Abnormal; Notable for the following:    Retic Ct Pct 5.6 (*)    RBC. 3.22 (*)    All other components within normal limits    EKG  EKG Interpretation None       Radiology Dg Chest 2 View  Result Date: 07/16/2017 CLINICAL DATA:  Shortness of breath. History of sickle cell disease. EXAM: CHEST  2 VIEW COMPARISON:  Chest x-ray dated June 20, 2017. FINDINGS: The cardiomediastinal silhouette is normal in size. Normal pulmonary vascularity. No focal consolidation, pleural effusion, or pneumothorax. No acute osseous abnormality. IMPRESSION: No active cardiopulmonary disease. Electronically Signed   By: Titus Dubin M.D.   On: 07/16/2017 12:17    Procedures Procedures (including critical care time)  Medications Ordered in ED Medications  0.45 % sodium chloride infusion ( Intravenous New Bag/Given 07/17/17 0730)  HYDROmorphone (DILAUDID) injection 2 mg (not administered)    Or  HYDROmorphone (DILAUDID) injection 2 mg (not administered)  ondansetron (ZOFRAN) injection 4 mg (not administered)  ketorolac (TORADOL) 30 MG/ML injection 30 mg (30 mg  Intravenous Given 07/17/17 0730)  HYDROmorphone (DILAUDID) injection 2 mg (2 mg Intravenous Given 07/17/17 0731)    Or  HYDROmorphone (DILAUDID) injection 2 mg ( Subcutaneous See Alternative 07/17/17 0731)  HYDROmorphone (DILAUDID) injection 2 mg (2 mg Intravenous Given 07/17/17 0830)    Or  HYDROmorphone (DILAUDID) injection 2 mg ( Subcutaneous See Alternative 07/17/17 0830)  HYDROmorphone (DILAUDID) injection 2 mg (2 mg Intravenous Given 07/17/17 0912)    Or  HYDROmorphone (DILAUDID) injection 2 mg ( Subcutaneous See Alternative 07/17/17 0912)     Initial Impression / Assessment and Plan / ED Course  I have reviewed the triage vital signs and the nursing notes.  Pertinent labs & imaging results that were available during my care of the patient were reviewed by me and considered in my medical decision making (see chart for details).     Patient with sickle cell pain. Seen yesterday for same and has multiple visits for same. Labs reassuring. Pain reportedly uncontrolled per patient. Discussed with Dr. Felipa Emory who knows the patient well. States there are many social issues that also contribute to her frequent visits. He will see the patient.  Final Clinical Impressions(s) / ED Diagnoses   Final diagnoses:  Sickle cell pain crisis [facility])    New Prescriptions New Prescriptions   No medications on file     Davonna Belling, MD 07/17/17 [phone]
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
## 2017-07-17 NOTE — ED Triage Notes (Signed)
Patient is complaining of sickle cell crisis. Patient states the pain started this morning. Patient last took pain medication yesterday.

## 2017-07-18 DIAGNOSIS — G894 Chronic pain syndrome: Secondary | ICD-10-CM

## 2017-07-18 DIAGNOSIS — F603 Borderline personality disorder: Secondary | ICD-10-CM

## 2017-07-18 DIAGNOSIS — D638 Anemia in other chronic diseases classified elsewhere: Secondary | ICD-10-CM

## 2017-07-18 MED ORDER — HYDROMORPHONE 1 MG/ML IV SOLN
INTRAVENOUS | Status: DC
  Administered 2017-07-18 (×2): 3.5 mg via INTRAVENOUS
  Administered 2017-07-18: 11:00:00 via INTRAVENOUS
  Administered 2017-07-18: 3.5 mg via INTRAVENOUS
  Administered 2017-07-19: 3 mg via INTRAVENOUS
  Administered 2017-07-19: 5 mg via INTRAVENOUS
  Administered 2017-07-19: 3.5 mg via INTRAVENOUS
  Administered 2017-07-19: 5.5 mg via INTRAVENOUS
  Administered 2017-07-19: 13:00:00 via INTRAVENOUS
  Administered 2017-07-19: 3.5 mg via INTRAVENOUS
  Administered 2017-07-20: 6 mg via INTRAVENOUS
  Administered 2017-07-20: 5.5 mg via INTRAVENOUS
  Administered 2017-07-20: 08:00:00 via INTRAVENOUS
  Administered 2017-07-20: 7.98 mg via INTRAVENOUS
  Filled 2017-07-18 (×2): qty 25

## 2017-07-18 NOTE — Progress Notes (Signed)
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 Hb SS with Crisis: Continue PCA and Toradol at current dose. Decrease IVF to [facility] as patient eating and drinking well. K-Pad for adjunctive treatment  2. Anemia of Chronic Disease: Hb At baseline.  3. Borderline Personality Disorder: Pt is rather docile today and excessivley polite. She is without histrionic behaviors at present.     Code Status: Full Code Family Communication: N/A Disposition Plan: Not yet ready for discharge  [place].  Pager [phone]. If 7PM-7AM, please contact night-coverage.  07/18/2017, 3:54 PM  LOS: 1 day   Interim History: Pt reports that she has pain in BUE's, BLE's and back at intensity of 9-10/10. She has used 25 mg of Dilaudid with 51/50:demands/deliveries in the last 24 hours. She reports a BM last night.   Consultants:  None  Procedures:  None  Antibiotics:  None    Objective: Vitals:   07/18/17 0857 07/18/17 1042 07/18/17 1332 07/18/17 1433  BP:  123/75  105/67  Pulse:  (!) 57  65  Resp: 15 14 16 16   Temp:  98.3 F (36.8 C)    TempSrc:  Oral    SpO2: 99% 96% 100% 97%  Weight:      Height:       Weight change:   Intake/Output Summary (Last 24 hours) at 07/18/17 1554 Last data filed at 07/18/17 1433  Gross per 24 hour  Intake          1343.33 ml  Output              125 ml  Net          1218.33 ml     Physical Exam General: Alert, awake, oriented x3, in no acute distress.  HEENT: [place]/AT PEERL, EOMI, anicteric Neck: Trachea midline,  no masses, no thyromegal,y no JVD, no carotid bruit OROPHARYNX:  Moist, No exudate/ erythema/lesions.  Heart: Regular rate and rhythm, without murmurs, rubs, gallops,  PMI non-displaced, no heaves or thrills on palpation.  Lungs: Clear to auscultation, no wheezing or rhonchi noted. No increased vocal fremitus resonant to percussion  Abdomen: Soft, nontender, nondistended, positive bowel sounds, no masses no hepatosplenomegaly noted.  Neuro: No focal neurological deficits noted cranial nerves II through XII grossly intact. Strength at functional baseline in bilateral upper and lower extremities. Musculoskeletal: No warmth swelling or erythema around joints, no spinal tenderness noted. Psychiatric: Patient alert and oriented x3, good insight and cognition, good recent to remote recall.     Data Reviewed: Basic Metabolic Panel:  Recent Labs Lab 07/16/17 1148 07/17/17 0550  NA 140 140  K 4.0 4.2  CL 108 110  CO2 24 22  GLUCOSE 105* 93  BUN 9 15  CREATININE 0.52 0.57  CALCIUM 9.4 9.7   Liver Function Tests:  Recent Labs Lab 07/16/17 1148 07/17/17 0550  AST 33 23  ALT 16 12*  ALKPHOS 57 62  BILITOT 1.7* 1.4*  PROT 7.8 8.1  ALBUMIN 4.2 4.1   No results for input(s): LIPASE, AMYLASE in the last 168 hours. No results for input(s): AMMONIA in the last 168 hours. CBC:  Recent Labs Lab 07/16/17 1148 07/17/17 0550  WBC 9.2 9.1  NEUTROABS 4.4 4.6  HGB 9.4* 9.5*  HCT 26.2* 26.5*  MCV 82.1 82.3  PLT 760* 681*   Cardiac Enzymes: No results for input(s): CKTOTAL, CKMB, CKMBINDEX, TROPONINI in the last 168 hours. BNP (last 3 results) No results for input(s): BNP in the last 8760 hours.  ProBNP (last 3 results) No results for input(s): PROBNP in the last 8760 hours.  CBG: No results for input(s): GLUCAP in the last 168 hours.  No results found for this or any previous visit (from the past 240 hour(s)).   Studies: Dg Chest 2 View  Result Date: 07/16/2017 CLINICAL DATA:  Shortness of breath. History of sickle cell disease. EXAM: CHEST  2 VIEW COMPARISON:  Chest x-ray dated June 20, 2017. FINDINGS: The cardiomediastinal silhouette  is normal in size. Normal pulmonary vascularity. No focal consolidation, pleural effusion, or pneumothorax. No acute osseous abnormality. IMPRESSION: No active cardiopulmonary disease. Electronically Signed   By: Titus Dubin M.D.   On: 07/16/2017 12:17   Dg Chest 2 View  Result Date: 06/20/2017 CLINICAL DATA:  Cough congestion and fever. Patient with sickle cell disease. EXAM: CHEST  2 VIEW COMPARISON:  05/28/2017 FINDINGS: Upper limits normal heart size noted. There is no evidence of focal airspace disease, pulmonary edema, suspicious pulmonary nodule/mass, pleural effusion, or pneumothorax. No acute bony abnormalities are identified. IMPRESSION: Upper limits normal heart size without evidence of acute cardiopulmonary disease. Electronically Signed   By: Margarette Canada M.D.   On: 06/20/2017 15:33   US Breast Ltd Uni Right Inc Axilla  Result Date: 07/12/2017 CLINICAL DATA:  26 year old female presenting with right breast palpable abnormality for approximately 1 year. EXAM: ULTRASOUND OF THE RIGHT BREAST COMPARISON:  Previous exam(s). FINDINGS: On physical exam, I palpate a firm mobile 1 cm mass in the upper-outer quadrant of the right breast. Targeted ultrasound is performed, showing an oval, circumscribed hypoechoic mass at the 11 o'clock position 4 cm from the nipple. It measures 1.7 x 1.2 x 0.8 cm. This correlates with the palpable abnormality. Note is made of internal and peripheral vascularity. 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 Recommendation is for six-month ultrasound follow-up. RECOMMENDATION: Diagnostic right breast ultrasound in 6 months. I have discussed the findings and recommendations with the patient. Results were also provided in writing at the conclusion of the visit. If applicable, a reminder letter will be sent to the patient regarding the next appointment. BI-RADS CATEGORY  3: Probably benign. Electronically  Signed   By: Kristopher Oppenheim M.D.   On: 07/12/2017 13:53    Scheduled Meds: . aspirin EC  81 mg Oral Daily  . enoxaparin (LOVENOX) injection  40 mg Subcutaneous Q24H  . HYDROmorphone   Intravenous Q4H  . ketorolac  30 mg Intravenous Q6H  . morphine  60 mg Oral Q12H  . senna-docusate  1 tablet Oral BID   Continuous Infusions: . sodium chloride 150 mL/hr at 07/17/17 0730  . dextrose 5 % and 0.45% NaCl 100 mL/hr at 07/18/17 0855  . diphenhydrAMINE (BENADRYL) IVPB(SICKLE CELL ONLY)      Principal Problem:   Hb-SS disease with vaso-occlusive pain ([facility]) Active Problems:   Anemia of chronic disease   Histrionic behavior   Sickle cell anemia with crisis ([place])   In excess of 25 minutes spent during this visit. Greater than 50% involved face to face contact with the patient for assessment, counseling and coordination of care.

## 2017-07-19 DIAGNOSIS — R110 Nausea: Secondary | ICD-10-CM

## 2017-07-19 MED ORDER — ENSURE ENLIVE PO LIQD
237.0000 mL | Freq: Three times a day (TID) | ORAL | Status: DC
  Administered 2017-07-19 – 2017-07-20 (×4): 237 mL via ORAL

## 2017-07-19 NOTE — Progress Notes (Signed)
SICKLE CELL SERVICE PROGRESS NOTE  Carmen [name] ZOX:[id number] DOB: 1991-10-17 DOA: 07/17/2017 PCP: [name], MD  Assessment/Plan: Principal Problem:   Hb-SS disease with vaso-occlusive pain ([place]) Active Problems:   Anemia of chronic disease   Histrionic behavior   Sickle cell anemia with crisis ([place])  1. Hb SS with Crisis: Continue PCA and Toradol at current dose. Decrease IVF to [facility] as patient eating and drinking well. K-Pad for adjunctive treatment  2. Anemia of Chronic Disease: Hb At baseline.  3. Borderline Personality Disorder: Pt is rather docile today and excessivley polite. She is without histrionic behaviors at present.  4. Decreased Oral Intake: Will order Ensure  5. Melancholy Mood: Offered psychiatry to evaluate depression. Pt declines.     Code Status: Full Code Family Communication: N/A Disposition Plan: Not yet ready for discharge  [place].  Pager [phone]. If 7PM-7AM, please contact night-coverage.  07/19/2017, 12:20 PM  LOS: 2 days   Interim History: Pt reports that she has pain in BUE's, BLE's and back at intensity of 9-10/10. She has used only 21 mg of Dilaudid with 45/42:demands/deliveries in the last 24 hours. She reports a BM last night. Pt also reports that she has been having nausea without emesis today. She has had very poor appetite and has not ate very much. Pt requesting Ensure.   Consultants:  None  Procedures:  None  Antibiotics:  None    Objective: Vitals:   07/19/17 0435 07/19/17 0800 07/19/17 0848 07/19/17 0900  BP: 121/71   128/86  Pulse: 65   70  Resp: 12 20  20   Temp: 98.5 F (36.9 C)   98.7 F (37.1 C)  TempSrc: Oral   Oral  SpO2: 94% 100% 100% 98%  Weight:      Height:       Weight change:   Intake/Output Summary (Last 24 hours) at 07/19/17 1220 Last data filed at 07/19/17 8295  Gross per 24 hour  Intake             2840 ml  Output                0 ml  Net             2840 ml      Physical Exam General: Alert, awake, oriented x3, in no acute distress.  HEENT: [place]/AT PEERL, EOMI, anicteric Neck: Trachea midline,  no masses, no thyromegal,y no JVD, no carotid bruit OROPHARYNX:  Moist, No exudate/ erythema/lesions.  Heart: Regular rate and rhythm, without murmurs, rubs, gallops, PMI non-displaced, no heaves or thrills on palpation.  Lungs: Clear to auscultation, no wheezing or rhonchi noted. No increased vocal fremitus resonant to percussion  Abdomen: Soft, nontender, nondistended, positive bowel sounds, no masses no hepatosplenomegaly noted.  Neuro: No focal neurological deficits noted cranial nerves II through XII grossly intact. Strength at functional baseline in bilateral upper and lower extremities. Musculoskeletal: No warmth swelling or erythema around joints, no spinal tenderness noted. Psychiatric: Patient alert and oriented x3, mood withdrawn and very quiet today. Good insight and cognition, good recent to remote recall.     Data Reviewed: Basic Metabolic Panel:  Recent Labs Lab 07/16/17 1148 07/17/17 0550  NA 140 140  K 4.0 4.2  CL 108 110  CO2 24 22  GLUCOSE 105* 93  BUN 9 15  CREATININE 0.52 0.57  CALCIUM 9.4 9.7   Liver Function Tests:  Recent Labs Lab 07/16/17 1148 07/17/17 0550  AST 33 23  ALT 16 12*  ALKPHOS 57 62  BILITOT 1.7* 1.4*  PROT 7.8 8.1  ALBUMIN 4.2 4.1   No results for input(s): LIPASE, AMYLASE in the last 168 hours. No results for input(s): AMMONIA in the last 168 hours. CBC:  Recent Labs Lab 07/16/17 1148 07/17/17 0550  WBC 9.2 9.1  NEUTROABS 4.4 4.6  HGB 9.4* 9.5*  HCT 26.2* 26.5*  MCV 82.1 82.3  PLT 760* 681*   Cardiac Enzymes: No results for input(s): CKTOTAL, CKMB, CKMBINDEX, TROPONINI in the last 168 hours. BNP (last 3 results) No results for input(s): BNP in the last 8760 hours.  ProBNP (last 3 results) No results for input(s): PROBNP in the last 8760 hours.  CBG: No results for  input(s): GLUCAP in the last 168 hours.  No results found for this or any previous visit (from the past 240 hour(s)).   Studies: Dg Chest 2 View  Result Date: 07/16/2017 CLINICAL DATA:  Shortness of breath. History of sickle cell disease. EXAM: CHEST  2 VIEW COMPARISON:  Chest x-ray dated June 20, 2017. FINDINGS: The cardiomediastinal silhouette is normal in size. Normal pulmonary vascularity. No focal consolidation, pleural effusion, or pneumothorax. No acute osseous abnormality. IMPRESSION: No active cardiopulmonary disease. Electronically Signed   By: Titus Dubin M.D.   On: 07/16/2017 12:17   Dg Chest 2 View  Result Date: 06/20/2017 CLINICAL DATA:  Cough congestion and fever. Patient with sickle cell disease. EXAM: CHEST  2 VIEW COMPARISON:  05/28/2017 FINDINGS: Upper limits normal heart size noted. There is no evidence of focal airspace disease, pulmonary edema, suspicious pulmonary nodule/mass, pleural effusion, or pneumothorax. No acute bony abnormalities are identified. IMPRESSION: Upper limits normal heart size without evidence of acute cardiopulmonary disease. Electronically Signed   By: Margarette Canada M.D.   On: 06/20/2017 15:33   US Breast Ltd Uni Right Inc Axilla  Result Date: 07/12/2017 CLINICAL DATA:  26 year old female presenting with right breast palpable abnormality for approximately 1 year. EXAM: ULTRASOUND OF THE RIGHT BREAST COMPARISON:  Previous exam(s). FINDINGS: On physical exam, I palpate a firm mobile 1 cm mass in the upper-outer quadrant of the right breast. Targeted ultrasound is performed, showing an oval, circumscribed hypoechoic mass at the 11 o'clock position 4 cm from the nipple. It measures 1.7 x 1.2 x 0.8 cm. This correlates with the palpable abnormality. Note is made of internal and peripheral vascularity. 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 Recommendation is for six-month ultrasound  follow-up. RECOMMENDATION: Diagnostic right breast ultrasound in 6 months. I have discussed the findings and recommendations with the patient. Results were also provided in writing at the conclusion of the visit. If applicable, a reminder letter will be sent to the patient regarding the next appointment. BI-RADS CATEGORY  3: Probably benign. Electronically Signed   By: Kristopher Oppenheim M.D.   On: 07/12/2017 13:53    Scheduled Meds: . aspirin EC  81 mg Oral Daily  . enoxaparin (LOVENOX) injection  40 mg Subcutaneous Q24H  . HYDROmorphone   Intravenous Q4H  . ketorolac  30 mg Intravenous Q6H  . morphine  60 mg Oral Q12H  . senna-docusate  1 tablet Oral BID   Continuous Infusions: . sodium chloride 150 mL/hr at 07/17/17 0730  . dextrose 5 % and 0.45% NaCl 100 mL/hr at 07/19/17 0254  . diphenhydrAMINE (BENADRYL) IVPB(SICKLE CELL ONLY)      Principal Problem:   Hb-SS disease with vaso-occlusive pain (H[facility]) Active Problems:  Anemia of chronic disease   Histrionic behavior   Sickle cell anemia with crisis ([place])   In excess of 25 minutes spent during this visit. Greater than 50% involved face to face contact with the patient for assessment, counseling and coordination of care.

## 2017-07-19 NOTE — Progress Notes (Signed)
Report received from previous RN and agree with previous assessment. Pt appears to be resting comfortably in bed at this time. Will continue to monitor pt closely.

## 2017-07-20 LAB — CBC WITH DIFFERENTIAL/PLATELET
Basophils Absolute: 0.1 10*3/uL (ref 0.0–0.1)
Basophils Relative: 1 %
EOS ABS: 0.1 10*3/uL (ref 0.0–0.7)
EOS PCT: 2 %
HCT: 21.9 % — ABNORMAL LOW (ref 36.0–46.0)
HEMOGLOBIN: 7.9 g/dL — AB (ref 12.0–15.0)
LYMPHS ABS: 3.9 10*3/uL (ref 0.7–4.0)
LYMPHS PCT: 44 %
MCH: 28.3 pg (ref 26.0–34.0)
MCHC: 36.1 g/dL — AB (ref 30.0–36.0)
MCV: 78.5 fL (ref 78.0–100.0)
MONOS PCT: 18 %
Monocytes Absolute: 1.5 10*3/uL — ABNORMAL HIGH (ref 0.1–1.0)
Neutro Abs: 3 10*3/uL (ref 1.7–7.7)
Neutrophils Relative %: 35 %
PLATELETS: 452 10*3/uL — AB (ref 150–400)
RBC: 2.79 MIL/uL — AB (ref 3.87–5.11)
RDW: 18.4 % — AB (ref 11.5–15.5)
WBC: 8.7 10*3/uL (ref 4.0–10.5)

## 2017-07-20 LAB — BASIC METABOLIC PANEL
Anion gap: 11 (ref 5–15)
BUN: 5 mg/dL — ABNORMAL LOW (ref 6–20)
CALCIUM: 9.3 mg/dL (ref 8.9–10.3)
CO2: 22 mmol/L (ref 22–32)
CREATININE: 0.46 mg/dL (ref 0.44–1.00)
Chloride: 105 mmol/L (ref 101–111)
Glucose, Bld: 77 mg/dL (ref 65–99)
Potassium: 4.1 mmol/L (ref 3.5–5.1)
SODIUM: 138 mmol/L (ref 135–145)

## 2017-07-20 NOTE — Progress Notes (Signed)
D/C instructions reviewed w/ pt. Pt verbalizes understanding and all questions answered. Pt in possession of d/c packet and packing her belongings. Has called for ride. Dressing now.

## 2017-07-20 NOTE — Progress Notes (Signed)
Pt states ride is at main entrance. Got on elevator, insistent upon ambulating off unit without escort.
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
## 2017-07-20 NOTE — Progress Notes (Signed)
Dilaudid 14mg  wasted from PCA syringe. Witnessed by Laurance Flatten, RN.

## 2017-07-22 ENCOUNTER — Emergency Department (HOSPITAL_COMMUNITY)
Admission: EM | Admit: 2017-07-22 | Discharge: 2017-07-22 | Disposition: A | Discharge status: Home / Self Care | Payer: Medicaid Other | Attending: Emergency Medicine | Admitting: Emergency Medicine

## 2017-07-22 ENCOUNTER — Encounter (HOSPITAL_COMMUNITY): Payer: Self-pay

## 2017-07-22 DIAGNOSIS — Z7982 Long term (current) use of aspirin: Secondary | ICD-10-CM | POA: Insufficient documentation

## 2017-07-22 DIAGNOSIS — E876 Hypokalemia: Secondary | ICD-10-CM | POA: Insufficient documentation

## 2017-07-22 DIAGNOSIS — D5700 Hb-SS disease with crisis, unspecified: Secondary | ICD-10-CM

## 2017-07-22 DIAGNOSIS — Z79899 Other long term (current) drug therapy: Secondary | ICD-10-CM | POA: Insufficient documentation

## 2017-07-22 DIAGNOSIS — D57219 Sickle-cell/Hb-C disease with crisis, unspecified: Secondary | ICD-10-CM | POA: Insufficient documentation

## 2017-07-22 LAB — COMPREHENSIVE METABOLIC PANEL
ALT: 19 U/L (ref 14–54)
AST: 26 U/L (ref 15–41)
Albumin: 4.1 g/dL (ref 3.5–5.0)
Alkaline Phosphatase: 59 U/L (ref 38–126)
Anion gap: 10 (ref 5–15)
BILIRUBIN TOTAL: 2.8 mg/dL — AB (ref 0.3–1.2)
BUN: 8 mg/dL (ref 6–20)
CALCIUM: 9.5 mg/dL (ref 8.9–10.3)
CO2: 24 mmol/L (ref 22–32)
CREATININE: 0.47 mg/dL (ref 0.44–1.00)
Chloride: 106 mmol/L (ref 101–111)
GFR calc Af Amer: >60 mL/min (ref >60)
Glucose, Bld: 86 mg/dL (ref 65–99)
Potassium: 3.3 mmol/L — ABNORMAL LOW (ref 3.5–5.1)
Sodium: 140 mmol/L (ref 135–145)
Total Protein: 7.8 g/dL (ref 6.5–8.1)

## 2017-07-22 LAB — CBC WITH DIFFERENTIAL/PLATELET
BASOS PCT: 0 %
Basophils Absolute: 0 10*3/uL (ref 0.0–0.1)
EOS ABS: 0.1 10*3/uL (ref 0.0–0.7)
Eosinophils Relative: 1 %
HCT: 29.6 % — ABNORMAL LOW (ref 36.0–46.0)
HEMOGLOBIN: 10.7 g/dL — AB (ref 12.0–15.0)
Lymphocytes Relative: 39 %
Lymphs Abs: 3.3 10*3/uL (ref 0.7–4.0)
MCH: 28.9 pg (ref 26.0–34.0)
MCHC: 36.1 g/dL — AB (ref 30.0–36.0)
MCV: 80 fL (ref 78.0–100.0)
MONOS PCT: 13 %
Monocytes Absolute: 1.1 10*3/uL — ABNORMAL HIGH (ref 0.1–1.0)
NEUTROS PCT: 47 %
Neutro Abs: 4.1 10*3/uL (ref 1.7–7.7)
Platelets: 453 10*3/uL — ABNORMAL HIGH (ref 150–400)
RBC: 3.7 MIL/uL — ABNORMAL LOW (ref 3.87–5.11)
RDW: 20 % — ABNORMAL HIGH (ref 11.5–15.5)
WBC: 8.6 10*3/uL (ref 4.0–10.5)

## 2017-07-22 LAB — RETICULOCYTES
RBC.: 3.7 MIL/uL — AB (ref 3.87–5.11)
RETIC CT PCT: 13.7 % — AB (ref 0.4–3.1)
Retic Count, Absolute: 506.9 10*3/uL — ABNORMAL HIGH (ref 19.0–186.0)

## 2017-07-22 MED ORDER — HYDROMORPHONE HCL 1 MG/ML IJ SOLN
2.0000 mg | INTRAMUSCULAR | Status: AC
Start: 2017-07-22 — End: 2017-07-22

## 2017-07-22 MED ORDER — HYDROMORPHONE HCL 1 MG/ML IJ SOLN
2.0000 mg | INTRAMUSCULAR | Status: AC
Start: 2017-07-22 — End: 2017-07-22
  Administered 2017-07-22: 2 mg via INTRAVENOUS
  Filled 2017-07-22: qty 2

## 2017-07-22 MED ORDER — SODIUM CHLORIDE 0.45 % IV SOLN
INTRAVENOUS | Status: DC
  Administered 2017-07-22: 07:00:00 via INTRAVENOUS

## 2017-07-22 MED ORDER — HYDROMORPHONE HCL 1 MG/ML IJ SOLN
0.5000 mg | Freq: Once | INTRAMUSCULAR | Status: AC
  Administered 2017-07-22: 0.5 mg via SUBCUTANEOUS
  Filled 2017-07-22: qty 1

## 2017-07-22 MED ORDER — POTASSIUM CHLORIDE ER 10 MEQ PO TBCR: 10.0000 meq | EXTENDED_RELEASE_TABLET | Freq: Every day | ORAL | 0 refills | Status: DC

## 2017-07-22 MED ORDER — KETOROLAC TROMETHAMINE 30 MG/ML IJ SOLN
30.0000 mg | INTRAMUSCULAR | Status: AC
  Administered 2017-07-22: 30 mg via INTRAVENOUS
  Filled 2017-07-22: qty 1

## 2017-07-22 MED ORDER — ONDANSETRON 4 MG PO TBDP
4.0000 mg | ORAL_TABLET | Freq: Once | ORAL | Status: AC
  Administered 2017-07-22: 4 mg via ORAL
  Filled 2017-07-22: qty 1

## 2017-07-22 NOTE — ED Provider Notes (Signed)
[facility] DEPT Provider Note   CSN: [id number] Arrival date & time: 07/22/17  0059     History   Chief Complaint Chief Complaint  Patient presents with  . Sickle Cell Pain Crisis    HPI Carmen [name] is a 25 y.o. female presenting with sickle cell pain crisis.  Patient states that she was seen on the 15th for sickle cell pain crisis. The pain was in both of her legs. She was admitted for pain control, and discharged on the 19th. She was feeling better at that time, but she went home, was taking her home medications, and the pain continued to increase. It became so bad, that she returned last night. She reports continued pain of her legs. It has not moved anywhere else. She denies fever, chills, cough, chest pain, shortness of breath, nausea, vomiting, abdominal pain, urinary symptoms, or abnormal bowel movements. She denies falls, trauma, or injury. She denies numbness, tingling. She has been taking her Percocet and morphine at home without improvement. It has been several years since she has needed a blood transfusion. She denies other medical history.  HPI  Past Medical History:  Diagnosis Date  . Abortion    05/2012  . Headache(784.0)   . Sickle cell crisis [facility])     Patient Active Problem List   Diagnosis Date Noted  . Sickle cell anemia with crisis ([place]) 07/17/2017  . Hypokalemia 06/27/2017  . 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 Sickle cell anemia with pain ([place]) 05/29/2017  . 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 Hb-SS disease without crisis ([place]) 08/15/2016  . Anemia of chronic disease   . S/P laparoscopic cholecystectomy 11/30/2014  . Thrombocytosis ([place]) 11/22/2014  . Systolic ejection murmur 40/06/6760    Past Surgical History:  Procedure Laterality Date  . CHOLECYSTECTOMY N/A 11/30/2014   Procedure: LAPAROSCOPIC CHOLECYSTECTOMY SINGLE SITE WITH INTRAOPERATIVE CHOLANGIOGRAM;   Surgeon: Michael Boston, MD;  Location: [facility];  Service: General;  Laterality: N/A;  . SPLENECTOMY      OB History    Gravida Para Term Preterm AB Living   1       1     SAB TAB Ectopic Multiple Live Births                   Home Medications    Prior to Admission medications   Medication Sig Start Date End Date Taking? Authorizing Provider  aspirin EC 81 MG tablet Take 1 tablet (81 mg total) by mouth daily. 05/31/17  Yes Leana Gamer, MD  ibuprofen (ADVIL,MOTRIN) 600 MG tablet Take 1 tablet (600 mg total) by mouth every 6 (six) hours as needed. Patient taking differently: Take 600 mg by mouth every 6 (six) hours as needed for headache, mild pain or moderate pain.  06/28/17  Yes Leana Gamer, MD  morphine (MS CONTIN) 60 MG 12 hr tablet Take 1 tablet (60 mg total) by mouth every 12 (twelve) hours. 05/31/17  Yes Leana Gamer, MD  oxyCODONE-acetaminophen (PERCOCET) 10-325 MG tablet Take 1 tablet by mouth every 4 (four) hours as needed for severe pain. 05/09/17  Yes [provider]  potassium chloride (K-DUR) 10 MEQ tablet Take 1 tablet (10 mEq total) by mouth daily. 07/22/17   Jubal Rademaker, PA-C    Family History Family History  Problem Relation Age of Onset  . Hypertension Mother   . Sickle cell anemia Sister   . Kidney  disease Sister        Lupus  . Arthritis Sister   . Sickle cell anemia Sister   . Sickle cell trait Sister   . Heart disease Maternal Aunt        CABG  . Heart disease Maternal Uncle        CABG  . Lupus Sister     Social History Social History  Substance Use Topics  . Smoking status: Never Smoker  . Smokeless tobacco: Never Used  . Alcohol use No     Allergies   Patient has no known allergies.   Review of Systems Review of Systems  Constitutional: Negative for chills and fever.  HENT: Negative for congestion and sore throat.   Respiratory: Negative for cough, chest tightness and shortness of breath.     Cardiovascular: Negative for chest pain, palpitations and leg swelling.  Gastrointestinal: Negative for abdominal pain, constipation, diarrhea, nausea and vomiting.  Genitourinary: Negative for dysuria and frequency.  Musculoskeletal: Positive for myalgias. Negative for back pain and neck pain.  Skin: Negative for wound.  Neurological: Negative for dizziness, light-headedness and headaches.  Hematological: Does not bruise/bleed easily.  Psychiatric/Behavioral: Negative for agitation and confusion.     Physical Exam Updated Vital Signs BP 115/80   Pulse 71   Temp 98.6 F (37 C) (Oral)   Resp 20   Ht 5\' 3"  (1.6 m)   Wt 77.1 kg (170 lb)   LMP 07/06/2017   SpO2 97%   BMI 30.11 kg/m   Physical Exam  Constitutional: She is oriented to person, place, and time. She appears well-developed and well-nourished. No distress.  HENT:  Head: Normocephalic and atraumatic.  Mouth/Throat: Uvula is midline, oropharynx is clear and moist and mucous membranes are normal.  Eyes: EOM are normal.  Neck: Normal range of motion.  Cardiovascular: Normal rate, regular rhythm and intact distal pulses.   Murmur heard. Pulmonary/Chest: Effort normal and breath sounds normal. No respiratory distress. She has no wheezes. She has no rales. She exhibits no tenderness.  Abdominal: Soft. Bowel sounds are normal. She exhibits no distension and no mass. There is no tenderness. There is no rebound and no guarding.  Musculoskeletal: Normal range of motion.  Strength of the lower extremities intact bilaterally. Pedal pulses intact bilaterally. No obvious injury, contusion, or laceration. Patient is ambulatory without difficulty. Soft compartments. No leg swelling or tenderness to palpation.  Neurological: She is alert and oriented to person, place, and time. She has normal strength. No sensory deficit. Gait normal. GCS eye subscore is 4. GCS verbal subscore is 5. GCS motor subscore is 6.  Skin: Skin is warm and dry.   Psychiatric: She has a normal mood and affect.  Nursing note and vitals reviewed.    ED Treatments / Results  Labs (all labs ordered are listed, but only abnormal results are displayed) Labs Reviewed  COMPREHENSIVE METABOLIC PANEL - Abnormal; Notable for the following:       Result Value   Potassium 3.3 (*)    Total Bilirubin 2.8 (*)    All other components within normal limits  CBC WITH DIFFERENTIAL/PLATELET - Abnormal; Notable for the following:    RBC 3.70 (*)    Hemoglobin 10.7 (*)    HCT 29.6 (*)    MCHC 36.1 (*)    RDW 20.0 (*)    Platelets 453 (*)    Monocytes Absolute 1.1 (*)    All other components within normal limits  RETICULOCYTES - Abnormal; Notable  for the following:    Retic Ct Pct 13.7 (*)    RBC. 3.70 (*)    Retic Count, Absolute 506.9 (*)    All other components within normal limits    EKG  EKG Interpretation None       Radiology No results found.  Procedures Procedures (including critical care time)  Medications Ordered in ED Medications  HYDROmorphone (DILAUDID) injection 0.5 mg (0.5 mg Subcutaneous Given 07/22/17 0510)  ondansetron (ZOFRAN-ODT) disintegrating tablet 4 mg (4 mg Oral Given 07/22/17 0513)  ketorolac (TORADOL) 30 MG/ML injection 30 mg (30 mg Intravenous Given 07/22/17 0648)  HYDROmorphone (DILAUDID) injection 2 mg (2 mg Intravenous Given 07/22/17 0648)    Or  HYDROmorphone (DILAUDID) injection 2 mg ( Subcutaneous See Alternative 07/22/17 0648)  HYDROmorphone (DILAUDID) injection 2 mg (2 mg Intravenous Given 07/22/17 0746)    Or  HYDROmorphone (DILAUDID) injection 2 mg ( Subcutaneous See Alternative 07/22/17 0746)  HYDROmorphone (DILAUDID) injection 2 mg (2 mg Intravenous Given 07/22/17 0835)    Or  HYDROmorphone (DILAUDID) injection 2 mg ( Subcutaneous See Alternative 07/22/17 0835)     Initial Impression / Assessment and Plan / ED Course  I have reviewed the triage vital signs and the nursing notes.  Pertinent labs & imaging  results that were available during my care of the patient were reviewed by me and considered in my medical decision making (see chart for details).     Pt presenting with sickle cell pain. No fevers, chills, SOB or CP. Doubt acute chest. Hgb stable, no leukocytosis. retic count slightly elevated. Slight hypokalemia. Will start SCC protocol with 1/2 normal saline, ketorolac and diluadid.   Pt reports slight improvement of pain after 1st dose (now at 8 or 9).  Continued improvement with 2nd dose. Pt thinks 3rd dose will be enough.   Pt reports improvement of pain with 3rd dose. She wants to be d/c so she can make her dr's apt. Discussed pt to continue to use at home medications as needed. Will d/c with rx for PO potassium. Return precautions given. Pt states she understands and agrees to plan.   Final Clinical Impressions(s) / ED Diagnoses   Final diagnoses:  Sickle cell pain crisis ([place])  Hypokalemia    New Prescriptions Discharge Medication List as of 07/22/2017  8:58 AM    START taking these medications   Details  potassium chloride (K-DUR) 10 MEQ tablet Take 1 tablet (10 mEq total) by mouth daily., Starting Fri 07/22/2017, Print         [place], Nakeisha Greenhouse, PA-C 07/22/17 1946    Palumbo, April, MD 07/23/17 0076

## 2017-07-22 NOTE — Discharge Instructions (Signed)
Continue to take at home pain medications as needed. Stay well-hydrated with water. Take potassium supplement once a day for the next week. Follow-up with your primary care doctor for further management of your symptoms.  You may follow-up at the sickle cell clinic if you have continued sickle cell pain. Return to the emergency room if you develop fever, chills, cough, chest pain, shortness of breath, or any new or worsening symptoms.

## 2017-07-22 NOTE — ED Triage Notes (Signed)
Pt was discharged yesterday and continues to have pain in her legs, pt has an appt tomorrow morning with her doctor

## 2017-07-22 NOTE — ED Notes (Signed)
Bed: WA05 Expected date:  Expected time:  Means of arrival:  Comments: 

## 2017-07-23 ENCOUNTER — Encounter (HOSPITAL_COMMUNITY): Payer: Self-pay

## 2017-07-23 ENCOUNTER — Emergency Department (HOSPITAL_COMMUNITY)
Admission: EM | Admit: 2017-07-23 | Discharge: 2017-07-23 | Disposition: A | Discharge status: Home / Self Care | Payer: Medicaid Other | Attending: Emergency Medicine | Admitting: Emergency Medicine

## 2017-07-23 DIAGNOSIS — D5700 Hb-SS disease with crisis, unspecified: Secondary | ICD-10-CM | POA: Diagnosis not present

## 2017-07-23 DIAGNOSIS — Z79899 Other long term (current) drug therapy: Secondary | ICD-10-CM | POA: Diagnosis not present

## 2017-07-23 DIAGNOSIS — Z7982 Long term (current) use of aspirin: Secondary | ICD-10-CM | POA: Insufficient documentation

## 2017-07-23 DIAGNOSIS — R52 Pain, unspecified: Secondary | ICD-10-CM | POA: Diagnosis present

## 2017-07-23 LAB — CBC WITH DIFFERENTIAL/PLATELET
BASOS ABS: 0 10*3/uL (ref 0.0–0.1)
Basophils Relative: 0 %
EOS ABS: 0.2 10*3/uL (ref 0.0–0.7)
EOS PCT: 2 %
HCT: 24.6 % — ABNORMAL LOW (ref 36.0–46.0)
Hemoglobin: 8.9 g/dL — ABNORMAL LOW (ref 12.0–15.0)
Lymphocytes Relative: 35 %
Lymphs Abs: 3.8 10*3/uL (ref 0.7–4.0)
MCH: 29.5 pg (ref 26.0–34.0)
MCHC: 36.2 g/dL — ABNORMAL HIGH (ref 30.0–36.0)
MCV: 81.5 fL (ref 78.0–100.0)
MONO ABS: 1.7 10*3/uL — AB (ref 0.1–1.0)
Monocytes Relative: 16 %
Neutro Abs: 5.1 10*3/uL (ref 1.7–7.7)
Neutrophils Relative %: 47 %
Platelets: 539 10*3/uL — ABNORMAL HIGH (ref 150–400)
RBC: 3.02 MIL/uL — AB (ref 3.87–5.11)
RDW: 18.5 % — AB (ref 11.5–15.5)
WBC: 10.9 10*3/uL — AB (ref 4.0–10.5)

## 2017-07-23 LAB — COMPREHENSIVE METABOLIC PANEL
ALBUMIN: 4.3 g/dL (ref 3.5–5.0)
ALK PHOS: 55 U/L (ref 38–126)
ALT: 18 U/L (ref 14–54)
AST: 25 U/L (ref 15–41)
Anion gap: 10 (ref 5–15)
BILIRUBIN TOTAL: 3.8 mg/dL — AB (ref 0.3–1.2)
BUN: 10 mg/dL (ref 6–20)
CALCIUM: 9.4 mg/dL (ref 8.9–10.3)
CO2: 21 mmol/L — ABNORMAL LOW (ref 22–32)
CREATININE: 0.51 mg/dL (ref 0.44–1.00)
Chloride: 109 mmol/L (ref 101–111)
GFR calc Af Amer: >60 mL/min (ref >60)
GLUCOSE: 89 mg/dL (ref 65–99)
Potassium: 3.5 mmol/L (ref 3.5–5.1)
Sodium: 140 mmol/L (ref 135–145)
TOTAL PROTEIN: 7.8 g/dL (ref 6.5–8.1)

## 2017-07-23 LAB — RETICULOCYTES
RBC.: 3.02 MIL/uL — AB (ref 3.87–5.11)
RETIC COUNT ABSOLUTE: 311.1 10*3/uL — AB (ref 19.0–186.0)
RETIC CT PCT: 10.3 % — AB (ref 0.4–3.1)

## 2017-07-23 MED ORDER — HYDROMORPHONE HCL 1 MG/ML IJ SOLN
2.0000 mg | Freq: Once | INTRAMUSCULAR | Status: AC
  Administered 2017-07-23: 2 mg via INTRAVENOUS
  Filled 2017-07-23: qty 2

## 2017-07-23 MED ORDER — SODIUM CHLORIDE 0.9 % IV BOLUS (SEPSIS)
500.0000 mL | Freq: Once | INTRAVENOUS | Status: AC
  Administered 2017-07-23: 500 mL via INTRAVENOUS

## 2017-07-23 NOTE — ED Provider Notes (Signed)
[facility] DEPT Provider Note   CSN: [id number] Arrival date & time: 07/23/17  0218     History   Chief Complaint Chief Complaint  Patient presents with  . Sickle Cell Pain Crisis    HPI Carmen [name] is a 26 y.o. female.  Patient with hx sickle cell c/o pain all over for past couple days.  States pain is diffuse and same as prior sickle cell pain. Pain constant, dull, severe, without specific exacerbating or alleviating factors. States compliant w home meds.   Denies chest pain or sob. No cough or uri c/o. No fever or chills.    The history is provided by the patient.  Sickle Cell Pain Crisis  Associated symptoms: no chest pain, no fever, no headaches, no shortness of breath and no sore throat     Past Medical History:  Diagnosis Date  . Abortion    05/2012  . Headache(784.0)   . Sickle cell crisis [facility])     Patient Active Problem List   Diagnosis Date Noted  . Sickle cell anemia with crisis ([place]) 07/17/2017  . Hypokalemia 06/27/2017  . 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 Sickle cell anemia with pain ([place]) 05/29/2017  . 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 Hb-SS disease without crisis ([place]) 08/15/2016  . Anemia of chronic disease   . S/P laparoscopic cholecystectomy 11/30/2014  . Thrombocytosis ([place]) 11/22/2014  . Systolic ejection murmur 49/70/2637    Past Surgical History:  Procedure Laterality Date  . CHOLECYSTECTOMY N/A 11/30/2014   Procedure: LAPAROSCOPIC CHOLECYSTECTOMY SINGLE SITE WITH INTRAOPERATIVE CHOLANGIOGRAM;  Surgeon: Michael Boston, MD;  Location: [facility] ORS;  Service: General;  Laterality: N/A;  . SPLENECTOMY      OB History    Gravida Para Term Preterm AB Living   1       1     SAB TAB Ectopic Multiple Live Births                   Home Medications    Prior to Admission medications   Medication Sig Start Date End Date Taking? Authorizing Provider  aspirin EC 81  MG tablet Take 1 tablet (81 mg total) by mouth daily. 05/31/17   Leana Gamer, MD  ibuprofen (ADVIL,MOTRIN) 600 MG tablet Take 1 tablet (600 mg total) by mouth every 6 (six) hours as needed. Patient taking differently: Take 600 mg by mouth every 6 (six) hours as needed for headache, mild pain or moderate pain.  06/28/17   Leana Gamer, MD  morphine (MS CONTIN) 60 MG 12 hr tablet Take 1 tablet (60 mg total) by mouth every 12 (twelve) hours. 05/31/17   Leana Gamer, MD  oxyCODONE-acetaminophen (PERCOCET) 10-325 MG tablet Take 1 tablet by mouth every 4 (four) hours as needed for severe pain. 05/09/17   [provider]  potassium chloride (K-DUR) 10 MEQ tablet Take 1 tablet (10 mEq total) by mouth daily. 07/22/17   [place], Sophia, PA-C    Family History Family History  Problem Relation Age of Onset  . Hypertension Mother   . Sickle cell anemia Sister   . Kidney disease Sister        Lupus  . Arthritis Sister   . Sickle cell anemia Sister   . Sickle cell trait Sister   . Heart disease Maternal Aunt        CABG  . Heart disease Maternal Uncle  CABG  . Lupus Sister     Social History Social History  Substance Use Topics  . Smoking status: Never Smoker  . Smokeless tobacco: Never Used  . Alcohol use No     Allergies   Patient has no known allergies.   Review of Systems Review of Systems  Constitutional: Negative for chills and fever.  HENT: Negative for sore throat.   Eyes: Negative for redness.  Respiratory: Negative for shortness of breath.   Cardiovascular: Negative for chest pain.  Gastrointestinal: Negative for abdominal pain.  Genitourinary: Negative for flank pain.  Musculoskeletal: Positive for arthralgias and myalgias. Negative for back pain, neck pain and neck stiffness.  Skin: Negative for rash.  Neurological: Negative for headaches.  Hematological: Does not bruise/bleed easily.  Psychiatric/Behavioral: Negative for confusion.       Physical Exam Updated Vital Signs BP (!) 134/97 (BP Location: Left Arm)   Pulse 68   Temp 98.3 F (36.8 C) (Oral)   Resp 18   LMP 07/06/2017   SpO2 100%   Physical Exam  Constitutional: She appears well-developed and well-nourished. No distress.  HENT:  Mouth/Throat: Oropharynx is clear and moist.  Eyes: Conjunctivae are normal. No scleral icterus.  Neck: Neck supple. No tracheal deviation present.  Cardiovascular: Normal rate, regular rhythm, normal heart sounds and intact distal pulses.  Exam reveals no gallop and no friction rub.   No murmur heard. Pulmonary/Chest: Effort normal and breath sounds normal. No respiratory distress.  Abdominal: Soft. Normal appearance. She exhibits no distension. There is no tenderness.  Musculoskeletal: She exhibits no edema.  Neurological: She is alert.  Skin: Skin is warm and dry. No rash noted. She is not diaphoretic.  Psychiatric: She has a normal mood and affect.  Nursing note and vitals reviewed.    ED Treatments / Results  Labs (all labs ordered are listed, but only abnormal results are displayed) Results for orders placed or performed during the hospital encounter of 07/23/17  Comprehensive metabolic panel  Result Value Ref Range   Sodium 140 135 - 145 mmol/L   Potassium 3.5 3.5 - 5.1 mmol/L   Chloride 109 101 - 111 mmol/L   CO2 21 (L) 22 - 32 mmol/L   Glucose, Bld 89 65 - 99 mg/dL   BUN 10 6 - 20 mg/dL   Creatinine, Ser 0.51 0.44 - 1.00 mg/dL   Calcium 9.4 8.9 - 10.3 mg/dL   Total Protein 7.8 6.5 - 8.1 g/dL   Albumin 4.3 3.5 - 5.0 g/dL   AST 25 15 - 41 U/L   ALT 18 14 - 54 U/L   Alkaline Phosphatase 55 38 - 126 U/L   Total Bilirubin 3.8 (H) 0.3 - 1.2 mg/dL   GFR calc non Af Amer >60 >60 mL/min   GFR calc Af Amer >60 >60 mL/min   Anion gap 10 5 - 15  CBC with Differential  Result Value Ref Range   WBC 10.9 (H) 4.0 - 10.5 K/uL   RBC 3.02 (L) 3.87 - 5.11 MIL/uL   Hemoglobin 8.9 (L) 12.0 - 15.0 g/dL   HCT 24.6 (L)  36.0 - 46.0 %   MCV 81.5 78.0 - 100.0 fL   MCH 29.5 26.0 - 34.0 pg   MCHC 36.2 (H) 30.0 - 36.0 g/dL   RDW 18.5 (H) 11.5 - 15.5 %   Platelets 539 (H) 150 - 400 K/uL   Neutrophils Relative % 47 %   Neutro Abs 5.1 1.7 - 7.7 K/uL  Lymphocytes Relative 35 %   Lymphs Abs 3.8 0.7 - 4.0 K/uL   Monocytes Relative 16 %   Monocytes Absolute 1.7 (H) 0.1 - 1.0 K/uL   Eosinophils Relative 2 %   Eosinophils Absolute 0.2 0.0 - 0.7 K/uL   Basophils Relative 0 %   Basophils Absolute 0.0 0.0 - 0.1 K/uL  Reticulocytes  Result Value Ref Range   Retic Ct Pct 10.3 (H) 0.4 - 3.1 %   RBC. 3.02 (L) 3.87 - 5.11 MIL/uL   Retic Count, Absolute 311.1 (H) 19.0 - 186.0 K/uL     EKG  EKG Interpretation None       Radiology No results found.  Procedures Procedures (including critical care time)  Medications Ordered in ED Medications  sodium chloride 0.9 % bolus 500 mL (not administered)  HYDROmorphone (DILAUDID) injection 2 mg (not administered)     Initial Impression / Assessment and Plan / ED Course  I have reviewed the triage vital signs and the nursing notes.  Pertinent labs & imaging results that were available during my care of the patient were reviewed by me and considered in my medical decision making (see chart for details).  Iv ns 500 cc bolus. o2 [place]. Dilaudid 2 mg iv.   Reviewed nursing notes and prior charts for additional history.   Recheck, pt appears comfortable. States pain improved but persists/significant.   Dilaudid iv.  Recheck pt, comfortable and appears in no acute distress.   Pt currently appears stable for d/c.   Pt instructed no driving for next 6 hours or anytime when taking narcotic type medication.   rec close pcp f/u.  Return precautions provided.     Final Clinical Impressions(s) / ED Diagnoses   Final diagnoses:  None    New Prescriptions New Prescriptions   No medications on file     Lajean Saver, MD 07/23/17 [phone]

## 2017-07-23 NOTE — ED Triage Notes (Signed)
Pt reports generalized sickle cell pain. She states that she feels worse than when she came yesterday. She reports that she no meds PTA. She denies SOB, fever, N/V or diarrhea. A&Ox4.

## 2017-07-23 NOTE — ED Notes (Signed)
PT DISCHARGED. INSTRUCTIONS GIVEN. AAOX. PT IN NO APPARENT DISTRESS WITH MODERATE PAIN. THE OPPORTUNITY TO ASK QUESTIONS WAS PROVIDED.

## 2017-07-23 NOTE — Discharge Instructions (Signed)
It was our pleasure to provide your ER care today - we hope that you feel better.  Rest. Drink adequate fluids. Take your pain medication as need.  Follow up with your doctor Monday.  Return to ER if worse, new symptoms, fevers, trouble breathing, other concern.  You were given pain medication in the ER - no driving for the next 6 hours, or anytime when taking narcotic type pain medication.

## 2017-07-24 ENCOUNTER — Encounter (HOSPITAL_COMMUNITY): Payer: Self-pay | Admitting: *Deleted

## 2017-07-24 ENCOUNTER — Emergency Department (HOSPITAL_COMMUNITY)
Admission: EM | Admit: 2017-07-24 | Discharge: 2017-07-24 | Disposition: A | Discharge status: Home / Self Care | Payer: Medicaid Other | Attending: Emergency Medicine | Admitting: Emergency Medicine

## 2017-07-24 DIAGNOSIS — Z7982 Long term (current) use of aspirin: Secondary | ICD-10-CM | POA: Diagnosis not present

## 2017-07-24 DIAGNOSIS — D5700 Hb-SS disease with crisis, unspecified: Secondary | ICD-10-CM | POA: Insufficient documentation

## 2017-07-24 LAB — CBC WITH DIFFERENTIAL/PLATELET
BASOS ABS: 0 10*3/uL (ref 0.0–0.1)
Basophils Relative: 0 %
EOS PCT: 1 %
Eosinophils Absolute: 0.1 10*3/uL (ref 0.0–0.7)
HEMATOCRIT: 26.8 % — AB (ref 36.0–46.0)
Hemoglobin: 9.6 g/dL — ABNORMAL LOW (ref 12.0–15.0)
LYMPHS PCT: 42 %
Lymphs Abs: 3.9 10*3/uL (ref 0.7–4.0)
MCH: 29.4 pg (ref 26.0–34.0)
MCHC: 35.8 g/dL (ref 30.0–36.0)
MCV: 82.2 fL (ref 78.0–100.0)
MONO ABS: 1 10*3/uL (ref 0.1–1.0)
MONOS PCT: 11 %
NEUTROS ABS: 4.3 10*3/uL (ref 1.7–7.7)
Neutrophils Relative %: 46 %
PLATELETS: 554 10*3/uL — AB (ref 150–400)
RBC: 3.26 MIL/uL — ABNORMAL LOW (ref 3.87–5.11)
RDW: 17.4 % — AB (ref 11.5–15.5)
WBC: 9.4 10*3/uL (ref 4.0–10.5)

## 2017-07-24 LAB — RETICULOCYTES
RBC.: 3.26 MIL/uL — ABNORMAL LOW (ref 3.87–5.11)
RETIC COUNT ABSOLUTE: 296.7 10*3/uL — AB (ref 19.0–186.0)
Retic Ct Pct: 9.1 % — ABNORMAL HIGH (ref 0.4–3.1)

## 2017-07-24 LAB — COMPREHENSIVE METABOLIC PANEL
ALBUMIN: 4.5 g/dL (ref 3.5–5.0)
ALK PHOS: 60 U/L (ref 38–126)
ALT: 16 U/L (ref 14–54)
AST: 20 U/L (ref 15–41)
Anion gap: 9 (ref 5–15)
BILIRUBIN TOTAL: 2.3 mg/dL — AB (ref 0.3–1.2)
BUN: 10 mg/dL (ref 6–20)
CALCIUM: 9.8 mg/dL (ref 8.9–10.3)
CO2: 23 mmol/L (ref 22–32)
Chloride: 109 mmol/L (ref 101–111)
Creatinine, Ser: 0.53 mg/dL (ref 0.44–1.00)
GFR calc Af Amer: >60 mL/min (ref >60)
GFR calc non Af Amer: >60 mL/min (ref >60)
GLUCOSE: 88 mg/dL (ref 65–99)
POTASSIUM: 3.3 mmol/L — AB (ref 3.5–5.1)
SODIUM: 141 mmol/L (ref 135–145)
TOTAL PROTEIN: 8.8 g/dL — AB (ref 6.5–8.1)

## 2017-07-24 MED ORDER — HYDROMORPHONE HCL 1 MG/ML IJ SOLN
2.0000 mg | INTRAMUSCULAR | Status: AC
  Administered 2017-07-24: 2 mg via INTRAVENOUS

## 2017-07-24 MED ORDER — HYDROMORPHONE HCL 1 MG/ML IJ SOLN
0.5000 mg | Freq: Once | INTRAMUSCULAR | Status: AC
  Administered 2017-07-24: 0.5 mg via SUBCUTANEOUS
  Filled 2017-07-24: qty 1

## 2017-07-24 MED ORDER — HYDROMORPHONE HCL 1 MG/ML IJ SOLN: 2.0000 mg | INTRAMUSCULAR | Status: AC

## 2017-07-24 MED ORDER — HYDROMORPHONE HCL 1 MG/ML IJ SOLN
2.0000 mg | INTRAMUSCULAR | Status: AC
  Administered 2017-07-24: 2 mg via INTRAVENOUS
  Filled 2017-07-24 (×3): qty 2

## 2017-07-24 MED ORDER — SODIUM CHLORIDE 0.45 % IV SOLN
INTRAVENOUS | Status: DC
  Administered 2017-07-24: 13:00:00 via INTRAVENOUS

## 2017-07-24 MED ORDER — HYDROMORPHONE HCL 1 MG/ML IJ SOLN
2.0000 mg | Freq: Once | INTRAMUSCULAR | Status: AC
  Administered 2017-07-24: 2 mg via SUBCUTANEOUS
  Filled 2017-07-24: qty 2

## 2017-07-24 NOTE — ED Notes (Addendum)
PT request for RN Manuela Schwartz to start [place] IV once transfer into room

## 2017-07-24 NOTE — ED Triage Notes (Signed)
Pt stated "I was here yesterday but the doctor just gave me 2 doses and then he told me he though I was fine.  I hurt all over.

## 2017-07-24 NOTE — Discharge Instructions (Signed)
Increase her pain medicines as directed by Dr. Alyson Ingles

## 2017-07-24 NOTE — ED Notes (Signed)
Bed: WTR7 Expected date:  Expected time:  Means of arrival:  Comments: 

## 2017-07-24 NOTE — ED Provider Notes (Signed)
[facility] DEPT Provider Note   CSN: [id number] Arrival date & time: 07/24/17  0533     History   Chief Complaint Chief Complaint  Patient presents with  . Sickle Cell Pain Crisis    HPI Carmen [name] is a 26 y.o. female.  HPI Patient resents with sickle cell pain crisis. States she hurts everywhere. For the last 9 days she's been in either the ER or admitted every day for the pain. Seen yesterday given 2 doses Dilaudid; home. Denies fever. Denies cough. Denies chest pain. He sees Dr. Alyson Ingles is a primary care doctor. States she saw him on Friday and he increased her oxycodone from Percocet to 20 mg oxycodone. States however insurance has not approved as yet and she has not been able to get it. Past Medical History:  Diagnosis Date  . Abortion    05/2012  . Headache(784.0)   . Sickle cell crisis [facility])     Patient Active Problem List   Diagnosis Date Noted  . Sickle cell anemia with crisis ([place]) 07/17/2017  . Hypokalemia 06/27/2017  . 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 Sickle cell anemia with pain ([place]) 05/29/2017  . 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 Hb-SS disease without crisis ([place]) 08/15/2016  . Anemia of chronic disease   . S/P laparoscopic cholecystectomy 11/30/2014  . Thrombocytosis ([place]) 11/22/2014  . Systolic ejection murmur 61/60/7371    Past Surgical History:  Procedure Laterality Date  . CHOLECYSTECTOMY N/A 11/30/2014   Procedure: LAPAROSCOPIC CHOLECYSTECTOMY SINGLE SITE WITH INTRAOPERATIVE CHOLANGIOGRAM;  Surgeon: Michael Boston, MD;  Location: [facility] ORS;  Service: General;  Laterality: N/A;  . SPLENECTOMY      OB History    Gravida Para Term Preterm AB Living   1       1     SAB TAB Ectopic Multiple Live Births                   Home Medications    Prior to Admission medications   Medication Sig Start Date End Date Taking? Authorizing Provider  aspirin EC 81 MG  tablet Take 1 tablet (81 mg total) by mouth daily. 05/31/17  Yes Leana Gamer, MD  morphine (MS CONTIN) 60 MG 12 hr tablet Take 1 tablet (60 mg total) by mouth every 12 (twelve) hours. 05/31/17  Yes Leana Gamer, MD  oxyCODONE-acetaminophen (PERCOCET) 10-325 MG tablet Take 1 tablet by mouth every 4 (four) hours as needed for severe pain. 05/09/17  Yes [provider]  ibuprofen (ADVIL,MOTRIN) 600 MG tablet Take 1 tablet (600 mg total) by mouth every 6 (six) hours as needed. Patient not taking: Reported on 07/24/2017 06/28/17   Leana Gamer, MD  potassium chloride (K-DUR) 10 MEQ tablet Take 1 tablet (10 mEq total) by mouth daily. Patient not taking: Reported on 07/24/2017 07/22/17   Franchot Heidelberg, PA-C    Family History Family History  Problem Relation Age of Onset  . Hypertension Mother   . Sickle cell anemia Sister   . Kidney disease Sister        Lupus  . Arthritis Sister   . Sickle cell anemia Sister   . Sickle cell trait Sister   . Heart disease Maternal Aunt        CABG  . Heart disease Maternal Uncle        CABG  . Lupus Sister     Social History Social  History  Substance Use Topics  . Smoking status: Never Smoker  . Smokeless tobacco: Never Used  . Alcohol use No     Allergies   Patient has no known allergies.   Review of Systems Review of Systems  Constitutional: Negative for appetite change and fever.  Eyes: Negative for photophobia.  Respiratory: Negative for shortness of breath.   Cardiovascular: Positive for chest pain.  Gastrointestinal: Positive for abdominal pain.  Endocrine: Negative for polyuria.  Genitourinary: Negative for dysuria.  Musculoskeletal: Positive for back pain, myalgias and neck pain.  Skin: Negative for rash.  Neurological: Negative for weakness.  Psychiatric/Behavioral: Negative for confusion.     Physical Exam Updated Vital Signs BP 122/84   Pulse 62   Temp 98.7 F (37.1 C)   Resp 16   Ht 5'  3" (1.6 m)   Wt 79.4 kg (175 lb)   LMP 07/06/2017   SpO2 100%   BMI 31.00 kg/m   Physical Exam  Constitutional: She appears well-developed.  HENT:  Head: Normocephalic.  Eyes: Pupils are equal, round, and reactive to light.  Neck: Neck supple.  Cardiovascular: Normal rate.   Pulmonary/Chest: Effort normal.  Abdominal: Soft. There is no tenderness.  Musculoskeletal: She exhibits no edema.  Neurological: She is alert.  Skin: Skin is warm. Capillary refill takes less than 2 seconds. No erythema.     ED Treatments / Results  Labs (all labs ordered are listed, but only abnormal results are displayed) Labs Reviewed  COMPREHENSIVE METABOLIC PANEL - Abnormal; Notable for the following:       Result Value   Potassium 3.3 (*)    Total Protein 8.8 (*)    Total Bilirubin 2.3 (*)    All other components within normal limits  CBC WITH DIFFERENTIAL/PLATELET - Abnormal; Notable for the following:    RBC 3.26 (*)    Hemoglobin 9.6 (*)    HCT 26.8 (*)    RDW 17.4 (*)    Platelets 554 (*)    All other components within normal limits  RETICULOCYTES - Abnormal; Notable for the following:    Retic Ct Pct 9.1 (*)    RBC. 3.26 (*)    Retic Count, Absolute 296.7 (*)    All other components within normal limits    EKG  EKG Interpretation None       Radiology No results found.  Procedures Procedures (including critical care time)  Medications Ordered in ED Medications  0.45 % sodium chloride infusion ( Intravenous New Bag/Given 07/24/17 1302)  HYDROmorphone (DILAUDID) injection 2 mg (not administered)    Or  HYDROmorphone (DILAUDID) injection 2 mg (not administered)  HYDROmorphone (DILAUDID) injection 2 mg (2 mg Intravenous Given 07/24/17 1258)    Or  HYDROmorphone (DILAUDID) injection 2 mg ( Subcutaneous See Alternative 07/24/17 1258)  HYDROmorphone (DILAUDID) injection 0.5 mg (0.5 mg Subcutaneous Given 07/24/17 0701)  HYDROmorphone (DILAUDID) injection 2 mg (2 mg Intravenous  Given 07/24/17 1441)    Or  HYDROmorphone (DILAUDID) injection 2 mg ( Subcutaneous See Alternative 07/24/17 1441)  HYDROmorphone (DILAUDID) injection 2 mg (2 mg Intravenous Given 07/24/17 1343)    Or  HYDROmorphone (DILAUDID) injection 2 mg ( Subcutaneous See Alternative 07/24/17 1343)     Initial Impression / Assessment and Plan / ED Course  I have reviewed the triage vital signs and the nursing notes.  Pertinent labs & imaging results that were available during my care of the patient were reviewed by me and considered in my medical decision  making (see chart for details).     Patient with sickle cell pain. History of same. Has been to the ER or her doctor every day for the last 5 days. Including an admission to the hospital. Labs at baseline. Feels better after treatment. States that her primary doctor has increased her baseline medicine which she hopes will keep her out of here.  Final Clinical Impressions(s) / ED Diagnoses   Final diagnoses:  Sickle cell crisis [facility])    New Prescriptions New Prescriptions   No medications on file     Davonna Belling, MD 07/24/17 [phone]

## 2017-07-25 ENCOUNTER — Other Ambulatory Visit: Payer: Self-pay | Admitting: Internal Medicine

## 2017-08-11 ENCOUNTER — Emergency Department (HOSPITAL_COMMUNITY)
Admission: EM | Admit: 2017-08-11 | Discharge: 2017-08-11 | Disposition: A | Discharge status: Home / Self Care | Payer: No Typology Code available for payment source | Attending: Emergency Medicine | Admitting: Emergency Medicine

## 2017-08-11 ENCOUNTER — Encounter (HOSPITAL_COMMUNITY): Payer: Self-pay | Admitting: Emergency Medicine

## 2017-08-11 DIAGNOSIS — Z7982 Long term (current) use of aspirin: Secondary | ICD-10-CM | POA: Insufficient documentation

## 2017-08-11 DIAGNOSIS — M79622 Pain in left upper arm: Secondary | ICD-10-CM | POA: Diagnosis not present

## 2017-08-11 DIAGNOSIS — M545 Low back pain, unspecified: Secondary | ICD-10-CM

## 2017-08-11 DIAGNOSIS — Z79899 Other long term (current) drug therapy: Secondary | ICD-10-CM | POA: Insufficient documentation

## 2017-08-11 MED ORDER — NAPROXEN 500 MG PO TABS: 500.0000 mg | ORAL_TABLET | Freq: Two times a day (BID) | ORAL | 0 refills | Status: DC

## 2017-08-11 MED ORDER — CYCLOBENZAPRINE HCL 10 MG PO TABS: 10.0000 mg | ORAL_TABLET | Freq: Two times a day (BID) | ORAL | 0 refills | Status: DC | PRN
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
## 2017-08-20 ENCOUNTER — Encounter (HOSPITAL_COMMUNITY): Payer: Self-pay | Admitting: Emergency Medicine

## 2017-08-20 ENCOUNTER — Inpatient Hospital Stay (HOSPITAL_COMMUNITY)
Admission: EM | Admit: 2017-08-20 | Discharge: 2017-08-23 | DRG: 812 | Disposition: A | Discharge status: Home / Self Care | Payer: Medicaid Other | Attending: Internal Medicine | Admitting: Internal Medicine

## 2017-08-20 DIAGNOSIS — R112 Nausea with vomiting, unspecified: Secondary | ICD-10-CM | POA: Diagnosis present

## 2017-08-20 DIAGNOSIS — Z832 Family history of diseases of the blood and blood-forming organs and certain disorders involving the immune mechanism: Secondary | ICD-10-CM

## 2017-08-20 DIAGNOSIS — R0682 Tachypnea, not elsewhere classified: Secondary | ICD-10-CM | POA: Diagnosis present

## 2017-08-20 DIAGNOSIS — Z791 Long term (current) use of non-steroidal anti-inflammatories (NSAID): Secondary | ICD-10-CM

## 2017-08-20 DIAGNOSIS — Z79899 Other long term (current) drug therapy: Secondary | ICD-10-CM

## 2017-08-20 DIAGNOSIS — R000 Tachycardia, unspecified: Secondary | ICD-10-CM | POA: Diagnosis present

## 2017-08-20 DIAGNOSIS — D638 Anemia in other chronic diseases classified elsewhere: Secondary | ICD-10-CM | POA: Diagnosis present

## 2017-08-20 DIAGNOSIS — Z7982 Long term (current) use of aspirin: Secondary | ICD-10-CM

## 2017-08-20 DIAGNOSIS — Z79891 Long term (current) use of opiate analgesic: Secondary | ICD-10-CM

## 2017-08-20 DIAGNOSIS — D5700 Hb-SS disease with crisis, unspecified: Principal | ICD-10-CM | POA: Diagnosis present

## 2017-08-20 DIAGNOSIS — D72829 Elevated white blood cell count, unspecified: Secondary | ICD-10-CM

## 2017-08-20 LAB — CBC WITH DIFFERENTIAL/PLATELET
BASOS PCT: 0 %
Basophils Absolute: 0 10*3/uL (ref 0.0–0.1)
EOS PCT: 1 %
Eosinophils Absolute: 0.1 10*3/uL (ref 0.0–0.7)
HEMATOCRIT: 27 % — AB (ref 36.0–46.0)
HEMOGLOBIN: 9.6 g/dL — AB (ref 12.0–15.0)
Lymphocytes Relative: 41 %
Lymphs Abs: 4.4 10*3/uL — ABNORMAL HIGH (ref 0.7–4.0)
MCH: 28.8 pg (ref 26.0–34.0)
MCHC: 35.6 g/dL (ref 30.0–36.0)
MCV: 81.1 fL (ref 78.0–100.0)
MONO ABS: 1.3 10*3/uL — AB (ref 0.1–1.0)
MONOS PCT: 12 %
NEUTROS PCT: 46 %
Neutro Abs: 5 10*3/uL (ref 1.7–7.7)
Platelets: 572 10*3/uL — ABNORMAL HIGH (ref 150–400)
RBC: 3.33 MIL/uL — ABNORMAL LOW (ref 3.87–5.11)
RDW: 15.5 % (ref 11.5–15.5)
WBC MORPHOLOGY: INCREASED
WBC: 10.8 10*3/uL — ABNORMAL HIGH (ref 4.0–10.5)

## 2017-08-20 LAB — COMPREHENSIVE METABOLIC PANEL
ALT: 14 U/L (ref 14–54)
AST: 32 U/L (ref 15–41)
Albumin: 4.5 g/dL (ref 3.5–5.0)
Alkaline Phosphatase: 64 U/L (ref 38–126)
Anion gap: 11 (ref 5–15)
BUN: 8 mg/dL (ref 6–20)
CHLORIDE: 106 mmol/L (ref 101–111)
CO2: 23 mmol/L (ref 22–32)
CREATININE: 0.56 mg/dL (ref 0.44–1.00)
Calcium: 10.1 mg/dL (ref 8.9–10.3)
GFR calc Af Amer: >60 mL/min (ref >60)
Glucose, Bld: 90 mg/dL (ref 65–99)
POTASSIUM: 3.9 mmol/L (ref 3.5–5.1)
SODIUM: 140 mmol/L (ref 135–145)
Total Bilirubin: 2.1 mg/dL — ABNORMAL HIGH (ref 0.3–1.2)
Total Protein: 8.7 g/dL — ABNORMAL HIGH (ref 6.5–8.1)

## 2017-08-20 LAB — RETICULOCYTES
RBC.: 3.33 MIL/uL — AB (ref 3.87–5.11)
RETIC COUNT ABSOLUTE: 229.8 10*3/uL — AB (ref 19.0–186.0)
Retic Ct Pct: 6.9 % — ABNORMAL HIGH (ref 0.4–3.1)

## 2017-08-20 MED ORDER — HYDROMORPHONE HCL 1 MG/ML IJ SOLN
0.5000 mg | Freq: Once | INTRAMUSCULAR | Status: AC
  Administered 2017-08-20: 0.5 mg via SUBCUTANEOUS
  Filled 2017-08-20: qty 1

## 2017-08-20 MED ORDER — HYDROMORPHONE HCL 1 MG/ML IJ SOLN
1.0000 mg | INTRAMUSCULAR | Status: AC
  Administered 2017-08-20: 1 mg via INTRAVENOUS
  Filled 2017-08-20: qty 1

## 2017-08-20 MED ORDER — SODIUM CHLORIDE 0.9 % IV BOLUS (SEPSIS)
1000.0000 mL | Freq: Once | INTRAVENOUS | Status: AC
  Administered 2017-08-20: 1000 mL via INTRAVENOUS

## 2017-08-20 MED ORDER — HYDROMORPHONE HCL 1 MG/ML IJ SOLN
1.0000 mg | INTRAMUSCULAR | Status: DC
  Filled 2017-08-20: qty 1

## 2017-08-20 MED ORDER — HYDROMORPHONE HCL 1 MG/ML IJ SOLN
1.0000 mg | Freq: Once | INTRAMUSCULAR | Status: AC
  Administered 2017-08-20: 1 mg via INTRAVENOUS
  Filled 2017-08-20: qty 1
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
## 2017-08-20 NOTE — ED Provider Notes (Signed)
[facility] DEPT Provider Note   CSN: [id number] Arrival date & time: 08/20/17  1929     History   Chief Complaint Chief Complaint  Patient presents with  . Sickle Cell Pain Crisis    HPI Carmen [name] is a 26 y.o. female who presents today for evaluation of sickle cell crisis. She reports that she ran out of her home morphine, and that her home Percocet 10mg  are not helping.  She reports body wide pain from her shoulders to her knees.  She denies any chest or abdominal pain. No headache. She denies any fevers or chills. No recent coughs.  She says that this feels like her normal sickle cell crisis.  She reports that she feels like she may be dehydrated. She reports that she has not been drinking or eating well as her pain has been worsening.    HPI  Past Medical History:  Diagnosis Date  . Abortion    05/2012  . Headache(784.0)   . Sickle cell crisis [facility])     Patient Active Problem List   Diagnosis Date Noted  . 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 Hypokalemia 06/27/2017  . 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 Sickle cell anemia with pain ([place]) 05/29/2017  . 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[place]) 04/05/2017  . Hb-SS disease without crisis ([place]) 08/15/2016  . Anemia of chronic disease   . S/P laparoscopic cholecystectomy 11/30/2014  . Thrombocytosis ([place]) 11/22/2014  . Systolic ejection murmur 53/66/4403    Past Surgical History:  Procedure Laterality Date  . CHOLECYSTECTOMY N/A 11/30/2014   Procedure: LAPAROSCOPIC CHOLECYSTECTOMY SINGLE SITE WITH INTRAOPERATIVE CHOLANGIOGRAM;  Surgeon: Michael Boston, MD;  Location: [facility] ORS;  Service: General;  Laterality: N/A;  . SPLENECTOMY      OB History    Gravida Para Term Preterm AB Living   1       1     SAB TAB Ectopic Multiple Live Births                   Home Medications    Prior to Admission medications     Medication Sig Start Date End Date Taking? Authorizing Provider  aspirin EC 81 MG tablet Take 1 tablet (81 mg total) by mouth daily. 05/31/17  Yes Leana Gamer, MD  cyclobenzaprine (FLEXERIL) 10 MG tablet Take 1 tablet (10 mg total) by mouth 2 (two) times daily as needed for muscle spasms. 08/11/17  Yes Glyn Ade, PA-C  morphine (MS CONTIN) 60 MG 12 hr tablet Take 1 tablet (60 mg total) by mouth every 12 (twelve) hours. 05/31/17  Yes Leana Gamer, MD  naproxen (NAPROSYN) 500 MG tablet Take 1 tablet (500 mg total) by mouth 2 (two) times daily. 08/11/17  Yes Glyn Ade, PA-C  Oxycodone HCl 10 MG TABS Take 20 mg by mouth every 6 (six) hours as needed (PAIN).   Yes [provider]  ibuprofen (ADVIL,MOTRIN) 600 MG tablet Take 1 tablet (600 mg total) by mouth every 6 (six) hours as needed. Patient not taking: Reported on 07/24/2017 06/28/17   Leana Gamer, MD  potassium chloride (K-DUR) 10 MEQ tablet Take 1 tablet (10 mEq total) by mouth daily. Patient not taking: Reported on 07/24/2017 07/22/17   Franchot Heidelberg, PA-C    Family History Family History  Problem Relation Age of Onset  . Hypertension Mother   . Sickle cell  anemia Sister   . Kidney disease Sister        Lupus  . Arthritis Sister   . Sickle cell anemia Sister   . Sickle cell trait Sister   . Heart disease Maternal Aunt        CABG  . Heart disease Maternal Uncle        CABG  . Lupus Sister     Social History Social History  Substance Use Topics  . Smoking status: Never Smoker  . Smokeless tobacco: Never Used  . Alcohol use No     Allergies   Patient has no known allergies.   Review of Systems Review of Systems  Constitutional: Positive for appetite change. Negative for chills and fever.  HENT: Negative for ear pain and sore throat.   Eyes: Negative for pain and visual disturbance.  Respiratory: Negative for cough and shortness of breath.   Cardiovascular: Negative  for chest pain and palpitations.  Gastrointestinal: Negative for abdominal pain, nausea and vomiting.  Genitourinary: Negative for dysuria and hematuria.  Musculoskeletal: Positive for arthralgias, back pain and myalgias. Negative for neck pain.  Skin: Negative for color change and rash.  Neurological: Negative for seizures, syncope and headaches.  All other systems reviewed and are negative.    Physical Exam Updated Vital Signs BP 112/80   Pulse 77   Temp 98.1 F (36.7 C) (Oral)   Resp 20   Ht 5\' 3"  (1.6 m)   Wt 77.1 kg (169 lb 15.6 oz)   LMP 08/05/2017   SpO2 100%   BMI 30.11 kg/m   Physical Exam  Constitutional: She is oriented to person, place, and time. She appears well-developed and well-nourished.  Appears uncomfortable.   HENT:  Head: Normocephalic and atraumatic.  Mouth/Throat: Oropharynx is clear and moist.  Eyes: Conjunctivae are normal.  Neck: Normal range of motion. Neck supple.  Cardiovascular: Normal rate and regular rhythm.   No murmur heard. Pulmonary/Chest: Effort normal and breath sounds normal. No respiratory distress.  Abdominal: Soft. There is no tenderness.  Musculoskeletal: She exhibits no edema or deformity.  Neurological: She is alert and oriented to person, place, and time.  Skin: Skin is warm and dry.  Psychiatric: She has a normal mood and affect. Her behavior is normal.  Nursing note and vitals reviewed.    ED Treatments / Results  Labs (all labs ordered are listed, but only abnormal results are displayed) Labs Reviewed  COMPREHENSIVE METABOLIC PANEL - Abnormal; Notable for the following:       Result Value   Total Protein 8.7 (*)    Total Bilirubin 2.1 (*)    All other components within normal limits  CBC WITH DIFFERENTIAL/PLATELET - Abnormal; Notable for the following:    WBC 10.8 (*)    RBC 3.33 (*)    Hemoglobin 9.6 (*)    HCT 27.0 (*)    Platelets 572 (*)    Lymphs Abs 4.4 (*)    Monocytes Absolute 1.3 (*)    All other  components within normal limits  RETICULOCYTES - Abnormal; Notable for the following:    Retic Ct Pct 6.9 (*)    RBC. 3.33 (*)    Retic Count, Absolute 229.8 (*)    All other components within normal limits    EKG  EKG Interpretation None       Radiology No results found.  Procedures Procedures (including critical care time)  Medications Ordered in ED Medications  HYDROmorphone (DILAUDID) injection 0.5 mg (0.5  mg Subcutaneous Given 08/20/17 1957)  HYDROmorphone (DILAUDID) injection 1 mg (1 mg Intravenous Given 08/20/17 2332)  sodium chloride 0.9 % bolus 1,000 mL (1,000 mLs Intravenous New Bag/Given 08/20/17 2332)  HYDROmorphone (DILAUDID) injection 1 mg (1 mg Intravenous Given 08/20/17 2339)  HYDROmorphone (DILAUDID) injection 2 mg (2 mg Intravenous Given 08/21/17 0018)  HYDROmorphone (DILAUDID) injection 2 mg (2 mg Intravenous Given 08/21/17 0123)     Initial Impression / Assessment and Plan / ED Course  I have reviewed the triage vital signs and the nursing notes.  Pertinent labs & imaging results that were available during my care of the patient were reviewed by me and considered in my medical decision making (see chart for details).  Clinical Course as of Aug 21 124  Nancy Fetter Aug 21, 2017  0036 Patient reevaluated, feels like her pain has slightly eased off in her shoulders, however is still present and unchanged elsewhere.    [EH]    Clinical Course User Index [EH] Lorin Glass, PA-C   [name] presents for evaluation of sickle cell pain crisis.    At shift change care was transferred to [facility] who will follow pending studies, re-evaulate and determine disposition.     Final Clinical Impressions(s) / ED Diagnoses   Final diagnoses:  Sickle cell pain crisis [facility])    New Prescriptions New Prescriptions   No medications on file     Ollen Gross 08/21/17 0126    Orlie Dakin, MD 08/22/17 1340

## 2017-08-20 NOTE — ED Notes (Signed)
Patient prefers to have blood drawn when IV is started.

## 2017-08-20 NOTE — ED Notes (Signed)
Attempted IV x2, unsuccessful. RN at bedside for USIV and blood draw.

## 2017-08-21 ENCOUNTER — Encounter (HOSPITAL_COMMUNITY): Payer: Self-pay

## 2017-08-21 DIAGNOSIS — R52 Pain, unspecified: Secondary | ICD-10-CM | POA: Diagnosis present

## 2017-08-21 DIAGNOSIS — Z7982 Long term (current) use of aspirin: Secondary | ICD-10-CM | POA: Diagnosis not present

## 2017-08-21 DIAGNOSIS — D5700 Hb-SS disease with crisis, unspecified: Secondary | ICD-10-CM | POA: Diagnosis present

## 2017-08-21 DIAGNOSIS — R0682 Tachypnea, not elsewhere classified: Secondary | ICD-10-CM | POA: Diagnosis present

## 2017-08-21 DIAGNOSIS — Z791 Long term (current) use of non-steroidal anti-inflammatories (NSAID): Secondary | ICD-10-CM | POA: Diagnosis not present

## 2017-08-21 DIAGNOSIS — R112 Nausea with vomiting, unspecified: Secondary | ICD-10-CM | POA: Diagnosis present

## 2017-08-21 DIAGNOSIS — Z79899 Other long term (current) drug therapy: Secondary | ICD-10-CM | POA: Diagnosis not present

## 2017-08-21 DIAGNOSIS — D72829 Elevated white blood cell count, unspecified: Secondary | ICD-10-CM

## 2017-08-21 DIAGNOSIS — R000 Tachycardia, unspecified: Secondary | ICD-10-CM | POA: Diagnosis present

## 2017-08-21 DIAGNOSIS — Z79891 Long term (current) use of opiate analgesic: Secondary | ICD-10-CM | POA: Diagnosis not present

## 2017-08-21 DIAGNOSIS — D638 Anemia in other chronic diseases classified elsewhere: Secondary | ICD-10-CM | POA: Diagnosis not present

## 2017-08-21 DIAGNOSIS — Z832 Family history of diseases of the blood and blood-forming organs and certain disorders involving the immune mechanism: Secondary | ICD-10-CM | POA: Diagnosis not present

## 2017-08-21 LAB — HCG, QUANTITATIVE, PREGNANCY: hCG, Beta Chain, Quant, S: <1 m[IU]/mL (ref <5)

## 2017-08-21 MED ORDER — HYDROMORPHONE HCL 2 MG/ML IJ SOLN
2.0000 mg | Freq: Once | INTRAMUSCULAR | Status: AC
  Administered 2017-08-21: 2 mg via INTRAVENOUS
  Filled 2017-08-21: qty 1

## 2017-08-21 MED ORDER — ONDANSETRON HCL 4 MG/2ML IJ SOLN
4.0000 mg | Freq: Four times a day (QID) | INTRAMUSCULAR | Status: DC | PRN
  Administered 2017-08-21 (×2): 4 mg via INTRAVENOUS
  Filled 2017-08-21 (×2): qty 2

## 2017-08-21 MED ORDER — ENOXAPARIN SODIUM 40 MG/0.4ML ~~LOC~~ SOLN
40.0000 mg | SUBCUTANEOUS | Status: DC
  Administered 2017-08-21 – 2017-08-22 (×2): 40 mg via SUBCUTANEOUS
  Filled 2017-08-21 (×3): qty 0.4

## 2017-08-21 MED ORDER — PROMETHAZINE HCL 25 MG PO TABS
25.0000 mg | ORAL_TABLET | Freq: Four times a day (QID) | ORAL | Status: DC | PRN
  Administered 2017-08-21 (×2): 25 mg via ORAL
  Filled 2017-08-21 (×2): qty 1

## 2017-08-21 MED ORDER — POLYETHYLENE GLYCOL 3350 17 G PO PACK: 17.0000 g | PACK | Freq: Every day | ORAL | Status: DC | PRN

## 2017-08-21 MED ORDER — DEXTROSE-NACL 5-0.45 % IV SOLN
INTRAVENOUS | Status: DC
  Administered 2017-08-21 – 2017-08-22 (×7): via INTRAVENOUS

## 2017-08-21 MED ORDER — SENNOSIDES-DOCUSATE SODIUM 8.6-50 MG PO TABS
1.0000 | ORAL_TABLET | Freq: Two times a day (BID) | ORAL | Status: DC
  Administered 2017-08-21 – 2017-08-22 (×5): 1 via ORAL
  Filled 2017-08-21 (×6): qty 1

## 2017-08-21 MED ORDER — ZOLPIDEM TARTRATE 5 MG PO TABS: 5.0000 mg | ORAL_TABLET | Freq: Every evening | ORAL | Status: DC | PRN

## 2017-08-21 MED ORDER — DIPHENHYDRAMINE HCL 50 MG/ML IJ SOLN: 12.5000 mg | Freq: Four times a day (QID) | INTRAMUSCULAR | Status: DC | PRN

## 2017-08-21 MED ORDER — ACETAMINOPHEN 325 MG PO TABS: 650.0000 mg | ORAL_TABLET | Freq: Four times a day (QID) | ORAL | Status: DC | PRN

## 2017-08-21 MED ORDER — HYDROMORPHONE 1 MG/ML IV SOLN
INTRAVENOUS | Status: DC
  Administered 2017-08-21: 5.5 mg via INTRAVENOUS
  Administered 2017-08-21: 2.5 mg via INTRAVENOUS
  Administered 2017-08-21: 5 mg via INTRAVENOUS
  Administered 2017-08-22: 1 mg via INTRAVENOUS
  Administered 2017-08-22: 5 mg via INTRAVENOUS
  Administered 2017-08-22: 4.9 mg via INTRAVENOUS
  Administered 2017-08-22: 5 mg via INTRAVENOUS
  Administered 2017-08-22: 7 mg via INTRAVENOUS
  Administered 2017-08-22: 4.4 mg via INTRAVENOUS
  Administered 2017-08-23: 11.2 mg via INTRAVENOUS
  Administered 2017-08-23: 11:00:00 via INTRAVENOUS
  Administered 2017-08-23: 13.3 mg via INTRAVENOUS
  Administered 2017-08-23: 7.7 mg via INTRAVENOUS
  Filled 2017-08-21 (×3): qty 25

## 2017-08-21 MED ORDER — ASPIRIN EC 81 MG PO TBEC
81.0000 mg | DELAYED_RELEASE_TABLET | Freq: Every day | ORAL | Status: DC
  Administered 2017-08-21 – 2017-08-23 (×3): 81 mg via ORAL
  Filled 2017-08-21 (×3): qty 1

## 2017-08-21 MED ORDER — SODIUM CHLORIDE 0.9% FLUSH: 9.0000 mL | INTRAVENOUS | Status: DC | PRN

## 2017-08-21 MED ORDER — KETOROLAC TROMETHAMINE 15 MG/ML IJ SOLN
30.0000 mg | Freq: Four times a day (QID) | INTRAMUSCULAR | Status: DC
  Administered 2017-08-21 – 2017-08-23 (×8): 30 mg via INTRAVENOUS
  Filled 2017-08-21 (×8): qty 2

## 2017-08-21 MED ORDER — NALOXONE HCL 0.4 MG/ML IJ SOLN: 0.4000 mg | INTRAMUSCULAR | Status: DC | PRN

## 2017-08-21 MED ORDER — SODIUM CHLORIDE 0.9 % IV SOLN: INTRAVENOUS | Status: DC

## 2017-08-21 MED ORDER — MORPHINE SULFATE ER 30 MG PO TBCR
60.0000 mg | EXTENDED_RELEASE_TABLET | Freq: Two times a day (BID) | ORAL | Status: DC
  Administered 2017-08-21 – 2017-08-23 (×6): 60 mg via ORAL
  Filled 2017-08-21 (×6): qty 2

## 2017-08-21 MED ORDER — HYDROMORPHONE 1 MG/ML IV SOLN
INTRAVENOUS | Status: DC
  Administered 2017-08-21: 6.5 mg via INTRAVENOUS
  Administered 2017-08-21: 04:00:00 via INTRAVENOUS
  Administered 2017-08-21: 5.5 mg via INTRAVENOUS
  Filled 2017-08-21: qty 25

## 2017-08-21 MED ORDER — KETOROLAC TROMETHAMINE 15 MG/ML IJ SOLN
15.0000 mg | Freq: Four times a day (QID) | INTRAMUSCULAR | Status: DC
  Administered 2017-08-21 (×2): 15 mg via INTRAVENOUS
  Filled 2017-08-21 (×2): qty 1

## 2017-08-21 MED ORDER — HYDROMORPHONE HCL 2 MG/ML IJ SOLN: 2.0000 mg | Freq: Once | INTRAMUSCULAR | Status: DC

## 2017-08-21 MED ORDER — HYDROMORPHONE HCL 2 MG/ML IJ SOLN
INTRAMUSCULAR | Status: AC
  Filled 2017-08-21: qty 1

## 2017-08-21 MED ORDER — CYCLOBENZAPRINE HCL 10 MG PO TABS: 10.0000 mg | ORAL_TABLET | Freq: Two times a day (BID) | ORAL | Status: DC | PRN

## 2017-08-21 MED ORDER — DIPHENHYDRAMINE HCL 12.5 MG/5ML PO ELIX: 12.5000 mg | ORAL_SOLUTION | Freq: Four times a day (QID) | ORAL | Status: DC | PRN

## 2017-08-21 NOTE — Progress Notes (Signed)
Assumed care of the pat. No changes in initial am assessment at this time Cont with plan of care

## 2017-08-21 NOTE — Progress Notes (Signed)
Pt with complaints of vomiting x 3. ondansetron given earlier in shift. Not time for another dose. Dr. Doreene Burke made aware. New order given for phenergan PO. Order placed. Hale Bogus.

## 2017-08-21 NOTE — ED Notes (Signed)
Call report to Sameeha Rockefeller [phone] at [facility]

## 2017-08-21 NOTE — Progress Notes (Addendum)
Patient had an uneventful day. Complaints of nausea throughout the day with vomiting in the morning. Patient care should continue with nausea, vomiting and pain management. Patient ambulated to bathroom without dizziness or altered gait. Patient education on incentive spirometry. Patient verbalized understanding for the need to continue incentive spirometry. Will continue to monitor.
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
## 2017-08-21 NOTE — H&P (Signed)
History and Physical    Carmen [name] LFY:[id number] DOB: [birth year] DOA: 08/20/2017  Referring MD/NP/PA:   PCP: [name], MD   Patient coming from:  The patient is coming from home.  At baseline, pt is independent for most of ADL. S  Chief Complaint: pain all over  HPI: Carmen [name] is a 26 y.o. female with medical history significant of sickle cell disease, histrionic behavior, who presents with pain all over.  Patient states that she has been having worsening pain in the past 2 days, which has been progressively getting worse, particularly in the bilateral arms and legs. The pain is constant, 10 out of 10 in severity, sharp, aggravated by movement. Patient denies chest pain, cough or shortness of breath. No fever or chills. Patient denies nausea, vomiting, diarrhea, abdominal pain, symptoms of UTI or unilateral weakness. Patient states that she ran out of her morphine pills today.  ED Course: pt was found to have stable hemoglobin 9.6 (1.6 on 07/24/17), WBC 10.8, electrolytes renal function okay, tachycardia, tachypnea, oxygen saturation 100% on room air, temperature normal.Patient is admitted to telemetry bed as inpatient.  Review of Systems:   General: no fevers, chills, no body weight gain, has fatigue HEENT: no blurry vision, hearing changes or sore throat Respiratory: no dyspnea, coughing, wheezing CV: no chest pain, no palpitations GI: no nausea, vomiting, abdominal pain, diarrhea, constipation GU: no dysuria, burning on urination, increased urinary frequency, hematuria  Ext: no leg edema Neuro: no unilateral weakness, numbness, or tingling, no vision change or hearing loss Skin: no rash, no skin tear. MSK: has pain all over Heme: No easy bruising.  Travel history: No recent long distant travel.  Allergy: No Known Allergies  Past Medical History:  Diagnosis Date  . Abortion    05/2012  . Headache(784.0)   . Sickle cell crisis [facility])     Past Surgical  History:  Procedure Laterality Date  . CHOLECYSTECTOMY N/A 11/30/2014   Procedure: LAPAROSCOPIC CHOLECYSTECTOMY SINGLE SITE WITH INTRAOPERATIVE CHOLANGIOGRAM;  Surgeon: Michael Boston, MD;  Location: [facility] ORS;  Service: General;  Laterality: N/A;  . SPLENECTOMY      Social History:  reports that she has never smoked. She has never used smokeless tobacco. She reports that she does not drink alcohol or use drugs.  Family History:  Family History  Problem Relation Age of Onset  . Hypertension Mother   . Sickle cell anemia Sister   . Kidney disease Sister        Lupus  . Arthritis Sister   . Sickle cell anemia Sister   . Sickle cell trait Sister   . Heart disease Maternal Aunt        CABG  . Heart disease Maternal Uncle        CABG  . Lupus Sister      Prior to Admission medications   Medication Sig Start Date End Date Taking? Authorizing Provider  aspirin EC 81 MG tablet Take 1 tablet (81 mg total) by mouth daily. 05/31/17  Yes Leana Gamer, MD  cyclobenzaprine (FLEXERIL) 10 MG tablet Take 1 tablet (10 mg total) by mouth 2 (two) times daily as needed for muscle spasms. 08/11/17  Yes Glyn Ade, PA-C  morphine (MS CONTIN) 60 MG 12 hr tablet Take 1 tablet (60 mg total) by mouth every 12 (twelve) hours. 05/31/17  Yes Leana Gamer, MD  naproxen (NAPROSYN) 500 MG tablet Take 1 tablet (500 mg total) by mouth 2 (two) times daily.  08/11/17  Yes Glyn Ade, PA-C  Oxycodone HCl 10 MG TABS Take 20 mg by mouth every 6 (six) hours as needed (PAIN).   Yes [provider]  ibuprofen (ADVIL,MOTRIN) 600 MG tablet Take 1 tablet (600 mg total) by mouth every 6 (six) hours as needed. Patient not taking: Reported on 07/24/2017 06/28/17   Leana Gamer, MD  potassium chloride (K-DUR) 10 MEQ tablet Take 1 tablet (10 mEq total) by mouth daily. Patient not taking: Reported on 07/24/2017 07/22/17   Franchot Heidelberg, PA-C    Physical Exam: Vitals:   08/20/17 2330  08/21/17 0000 08/21/17 0030 08/21/17 0100  BP: (!) 133/96 127/90 116/78 112/80  Pulse: 88 75 82 77  Resp: (!) 27 19 17 20   Temp:      TempSrc:      SpO2: 100% 100% 99% 100%  Weight:      Height:       General: Not in acute distress HEENT:       Eyes: PERRL, EOMI, no scleral icterus.       ENT: No discharge from the ears and nose, no pharynx injection, no tonsillar enlargement.        Neck: No JVD, no bruit, no mass felt. Heme: No neck lymph node enlargement. Cardiac: C5/E5, RRR, 2/6 systolic murmurs, No gallops or rubs. Respiratory: No rales, wheezing, rhonchi or rubs. GI: Soft, nondistended, nontender, no rebound pain, no organomegaly, BS present. GU: No hematuria Ext: No pitting leg edema bilaterally. 2+DP/PT pulse bilaterally. Musculoskeletal: has tenderness in both arms and legs Skin: No rashes.  Neuro: Alert, oriented X3, cranial nerves II-XII grossly intact, moves all extremities normally.  Psych: Patient is not psychotic, no suicidal or hemocidal ideation.  Labs on Admission: I have personally reviewed following labs and imaging studies  CBC:  Recent Labs Lab 08/20/17 2303  WBC 10.8*  NEUTROABS 5.0  HGB 9.6*  HCT 27.0*  MCV 81.1  PLT 277*   Basic Metabolic Panel:  Recent Labs Lab 08/20/17 2303  NA 140  K 3.9  CL 106  CO2 23  GLUCOSE 90  BUN 8  CREATININE 0.56  CALCIUM 10.1   GFR: Estimated Creatinine Clearance: 106.6 mL/min (by C-G formula based on SCr of 0.56 mg/dL). Liver Function Tests:  Recent Labs Lab 08/20/17 2303  AST 32  ALT 14  ALKPHOS 64  BILITOT 2.1*  PROT 8.7*  ALBUMIN 4.5   No results for input(s): LIPASE, AMYLASE in the last 168 hours. No results for input(s): AMMONIA in the last 168 hours. Coagulation Profile: No results for input(s): INR, PROTIME in the last 168 hours. Cardiac Enzymes: No results for input(s): CKTOTAL, CKMB, CKMBINDEX, TROPONINI in the last 168 hours. BNP (last 3 results) No results for input(s): PROBNP  in the last 8760 hours. HbA1C: No results for input(s): HGBA1C in the last 72 hours. CBG: No results for input(s): GLUCAP in the last 168 hours. Lipid Profile: No results for input(s): CHOL, HDL, LDLCALC, TRIG, CHOLHDL, LDLDIRECT in the last 72 hours. Thyroid Function Tests: No results for input(s): TSH, T4TOTAL, FREET4, T3FREE, THYROIDAB in the last 72 hours. Anemia Panel:  Recent Labs  08/20/17 2303  RETICCTPCT 6.9*   Urine analysis:    Component Value Date/Time   COLORURINE YELLOW 06/27/2017 0704   APPEARANCEUR HAZY (A) 06/27/2017 0704   LABSPEC 1.009 06/27/2017 0704   PHURINE 7.0 06/27/2017 0704   GLUCOSEU NEGATIVE 06/27/2017 0704   HGBUR NEGATIVE 06/27/2017 0704   BILIRUBINUR NEGATIVE 06/27/2017 8242  KETONESUR NEGATIVE 06/27/2017 0704   PROTEINUR NEGATIVE 06/27/2017 0704   UROBILINOGEN 1.0 09/12/2015 1548   NITRITE NEGATIVE 06/27/2017 0704   LEUKOCYTESUR NEGATIVE 06/27/2017 0704   Sepsis Labs: @LABRCNTIP (procalcitonin:4,lacticidven:4) )No results found for this or any previous visit (from the past 240 hour(s)).   Radiological Exams on Admission: No results found.   EKG: Not done in ED, will get one.   Assessment/Plan Principal Problem:   Sickle cell pain crisis ([place]) Active Problems:   Anemia of chronic disease   Leukocytosis   Sickle cell anemia with pain crisis (HCC):Patient does not have signs of infection. No indication for antibiotics. Hemoglobin stable, does not need transfusion at this moment.  -Admit to tele bed given tachycardia. -IV ketorolac -Benadryl for itch -IVF: 1L NS, then D5-1/2NS at 150 cc/h -Zofran for nausea -Start high dose PCA protocol for pain: loading dose 1 mg, bolus dose 0.5 mg, lockout interval 10 min and one hour dose limit at 2 mg.   Leukocytosis: WBC 10.8.  no signs of infection. Likely due to stress induced to demargination -follow up by CBC   DVT ppx:  SQ Lovenox Code Status: Full code Family Communication: None  at bed side. Disposition Plan:  Anticipate discharge back to previous home environment Consults called:  none Admission status:  Inpatient/tele   Date of Service 08/21/2017    Ivor Costa [facility] Pager [phone]  If 7PM-7AM, please contact night-coverage [email or web address] Password [email or web address] 08/21/2017, 2:42 AM

## 2017-08-22 DIAGNOSIS — D72829 Elevated white blood cell count, unspecified: Secondary | ICD-10-CM

## 2017-08-22 LAB — CBC WITH DIFFERENTIAL/PLATELET
BASOS PCT: 1 %
Basophils Absolute: 0.1 10*3/uL (ref 0.0–0.1)
EOS ABS: 0.2 10*3/uL (ref 0.0–0.7)
EOS PCT: 2 %
HCT: 21.6 % — ABNORMAL LOW (ref 36.0–46.0)
Hemoglobin: 8 g/dL — ABNORMAL LOW (ref 12.0–15.0)
Lymphocytes Relative: 43 %
Lymphs Abs: 4.8 10*3/uL — ABNORMAL HIGH (ref 0.7–4.0)
MCH: 29.5 pg (ref 26.0–34.0)
MCHC: 37 g/dL — AB (ref 30.0–36.0)
MCV: 79.7 fL (ref 78.0–100.0)
MONO ABS: 1.7 10*3/uL — AB (ref 0.1–1.0)
MONOS PCT: 15 %
Neutro Abs: 4.3 10*3/uL (ref 1.7–7.7)
Neutrophils Relative %: 39 %
Platelets: 411 10*3/uL — ABNORMAL HIGH (ref 150–400)
RBC: 2.71 MIL/uL — ABNORMAL LOW (ref 3.87–5.11)
RDW: 15.9 % — AB (ref 11.5–15.5)
WBC: 11.1 10*3/uL — ABNORMAL HIGH (ref 4.0–10.5)

## 2017-08-22 LAB — COMPREHENSIVE METABOLIC PANEL
ALBUMIN: 3.8 g/dL (ref 3.5–5.0)
ALT: 32 U/L (ref 14–54)
ANION GAP: 13 (ref 5–15)
AST: 51 U/L — ABNORMAL HIGH (ref 15–41)
Alkaline Phosphatase: 57 U/L (ref 38–126)
BILIRUBIN TOTAL: 3.3 mg/dL — AB (ref 0.3–1.2)
BUN: <5 mg/dL — ABNORMAL LOW (ref 6–20)
CO2: 22 mmol/L (ref 22–32)
Calcium: 9.5 mg/dL (ref 8.9–10.3)
Chloride: 107 mmol/L (ref 101–111)
Creatinine, Ser: 0.49 mg/dL (ref 0.44–1.00)
GFR calc non Af Amer: >60 mL/min (ref >60)
GLUCOSE: 88 mg/dL (ref 65–99)
POTASSIUM: 4.9 mmol/L (ref 3.5–5.1)
SODIUM: 142 mmol/L (ref 135–145)
TOTAL PROTEIN: 7.2 g/dL (ref 6.5–8.1)

## 2017-08-22 NOTE — Progress Notes (Signed)
Asked RN to come back in an hour, pt in bathroom. SRP, RN

## 2017-08-22 NOTE — Progress Notes (Signed)
Patient ID: Carmen [name], female   DOB: 1991-07-25, 26 y.o.   MRN: [id number] Subjective:  Patient reports that she has been having nausea without emesis today, she also have occasional abdominal cramping. She has had very poor appetite and has not ate very much. She has no fever, no diarrhea, no abd distension. She denies any chest pain, no SOB.  Objective:  Vital signs in last 24 hours:  Vitals:   08/22/17 0554 08/22/17 0945 08/22/17 1000 08/22/17 1239  BP: 117/68  117/77 119/71  Pulse: 71  70 68  Resp: 16 16 16 15   Temp: 98.2 F (36.8 C)  98.2 F (36.8 C) 98 F (36.7 C)  TempSrc: Oral  Oral Oral  SpO2: 98% 95% 98% 95%  Weight:      Height:        Intake/Output from previous day:   Intake/Output Summary (Last 24 hours) at 08/22/17 1826 Last data filed at 08/22/17 0600  Gross per 24 hour  Intake           2910.5 ml  Output             1000 ml  Net           1910.5 ml    Physical Exam: General: Alert, awake, oriented x3, in no acute distress.  HEENT: [place]/AT PEERL, EOMI Neck: Trachea midline,  no masses, no thyromegal,y no JVD, no carotid bruit OROPHARYNX:  Moist, No exudate/ erythema/lesions.  Heart: Regular rate and rhythm, without murmurs, rubs, gallops, PMI non-displaced, no heaves or thrills on palpation.  Lungs: Clear to auscultation, no wheezing or rhonchi noted. No increased vocal fremitus resonant to percussion  Abdomen: Soft, nontender, nondistended, positive bowel sounds, no masses no hepatosplenomegaly noted..  Neuro: No focal neurological deficits noted cranial nerves II through XII grossly intact. DTRs 2+ bilaterally upper and lower extremities. Strength 5 out of 5 in bilateral upper and lower extremities. Musculoskeletal: No warm swelling or erythema around joints, no spinal tenderness noted. Psychiatric: Patient alert and oriented x3, good insight and cognition, good recent to remote recall. Lymph node survey: No cervical axillary or inguinal  lymphadenopathy noted.  Lab Results:  Basic Metabolic Panel:    Component Value Date/Time   NA 142 08/22/2017 1131   K 4.9 08/22/2017 1131   CL 107 08/22/2017 1131   CO2 22 08/22/2017 1131   BUN <5 (L) 08/22/2017 1131   CREATININE 0.49 08/22/2017 1131   GLUCOSE 88 08/22/2017 1131   CALCIUM 9.5 08/22/2017 1131   CBC:    Component Value Date/Time   WBC 11.1 (H) 08/22/2017 1131   HGB 8.0 (L) 08/22/2017 1131   HCT 21.6 (L) 08/22/2017 1131   PLT 411 (H) 08/22/2017 1131   MCV 79.7 08/22/2017 1131   NEUTROABS 4.3 08/22/2017 1131   LYMPHSABS 4.8 (H) 08/22/2017 1131   MONOABS 1.7 (H) 08/22/2017 1131   EOSABS 0.2 08/22/2017 1131   BASOSABS 0.1 08/22/2017 1131    No results found for this or any previous visit (from the past 240 hour(s)).  Studies/Results: No results found.  Medications: Scheduled Meds: . aspirin EC  81 mg Oral Daily  . enoxaparin (LOVENOX) injection  40 mg Subcutaneous Q24H  . HYDROmorphone   Intravenous Q4H  . ketorolac  30 mg Intravenous Q6H  . morphine  60 mg Oral Q12H  . senna-docusate  1 tablet Oral BID   Continuous Infusions: . dextrose 5 % and 0.45% NaCl 150 mL/hr at 08/22/17 1724   PRN  Meds:.acetaminophen, cyclobenzaprine, diphenhydrAMINE **OR** diphenhydrAMINE, naloxone **AND** sodium chloride flush, ondansetron (ZOFRAN) IV, polyethylene glycol, promethazine, zolpidem  Consultants:  None  Procedures:  None  Antibiotics:  None  Assessment/Plan: Principal Problem:   Sickle cell pain crisis ([place]) Active Problems:   Anemia of chronic disease   Leukocytosis  1. Hb SS with Crisis: Continue PCA and Toradol at current dose. Decrease IVF to [facility] as patient eating and drinking well. K-Pad for adjunctive treatment  2. Anemia of Chronic Disease: Hb At baseline.  3. Nausea and Vomiting: Improving, labs/electrolytes are within normal  Code Status: Full Code Family Communication: N/A Disposition Plan: Not yet ready for discharge  Carol Theys,  [place] Mcneish  If 7PM-7AM, please contact night-coverage.  08/22/2017, 6:26 PM  LOS: 1 day

## 2017-08-22 NOTE — Progress Notes (Signed)
Patient refused morning labs. Will continue to monitor

## 2017-08-23 NOTE — Discharge Summary (Signed)
Physician Discharge Summary  [name] [name] RDE:[id number] DOB: 18-Mar-1991 DOA: 08/20/2017  PCP: [name], MD  Admit date: 08/20/2017  Discharge date: 08/23/2017  Discharge Diagnoses:  Principal Problem:   Sickle cell pain crisis ([place]) Active Problems:   Anemia of chronic disease   Leukocytosis  Discharge Condition: Stable Disposition:  Follow-up Information    [name], MD. Schedule an appointment as soon as possible for a visit in 1 week(s).   Specialty:  Family Medicine Contact information: [place] [address] [phone]          Pt is discharged home in good condition and is to follow up with [name], MD this week to have labs evaluated. He is instructed to increase activity slowly and balance with rest for the next few days, and use prescribed medication to complete treatment of pain  Diet: Regular  Wt Readings from Last 3 Encounters:  08/21/17 77 kg (169 lb 12.1 oz)  07/24/17 79.4 kg (175 lb)  07/23/17 79.4 kg (175 lb)   History of present illness: Carmen [name] is a 26 y.o. female with medical history significant of sickle cell disease, histrionic behavior, who presents with pain all over.  Patient states that she has been having worsening pain in the past 2 days, which has been progressively getting worse, particularly in the bilateral arms and legs. The pain is constant, 10 out of 10 in severity, sharp, aggravated by movement. Patient denies chest pain, cough or shortness of breath. No fever or chills. Patient denies nausea, vomiting, diarrhea, abdominal pain, symptoms of UTI or unilateral weakness. Patient states that she ran out of her morphine pills today.  ED Course: pt was found to have stable hemoglobin 9.6 (1.6 on 07/24/17), WBC 10.8, electrolytes renal function okay, tachycardia, tachypnea, oxygen saturation 100% on room air, temperature normal.Patient is admitted to telemetry bed as  inpatient.  Hospital Course:  Patient was admitted for sickle cell crisis and was managed appropriately with IV Dilaudid via PCA, IV ketorolac, IVF, and other adjunct therapies per sickle cell pain management protocol. She initially had nausea and vomiting but these resolved, Hb remained stable around baseline, she did well, no fever, denies chest pain, no SOB. Today, she was observed ambulating well without restrictions, tolerating PO intake without vomiting. Therefore she was discharged home in a hemodynamically stable condition. She will follow up with her PCP within one week of this discharge.   Discharge Exam: Vitals:   08/23/17 0411 08/23/17 0530  BP:  110/85  Pulse:  68  Resp: 13 15  Temp:  98.2 F (36.8 C)  SpO2: 97% 90%   Vitals:   08/22/17 2302 08/23/17 0209 08/23/17 0411 08/23/17 0530  BP:  106/65  110/85  Pulse:  65  68  Resp: 12 18 13 15   Temp:  98.3 F (36.8 C)  98.2 F (36.8 C)  TempSrc:  Oral  Oral  SpO2: 98% 98% 97% 90%  Weight:      Height:        General appearance : Awake, alert, not in any distress. Speech Clear. Not toxic looking HEENT: Atraumatic and Normocephalic, pupils equally reactive to light and accomodation Neck: Supple, no JVD. No cervical lymphadenopathy.  Chest: Good air entry bilaterally, no added sounds  CVS: S1 S2 regular, no murmurs.  Abdomen: Bowel sounds present, Non tender and not distended with no gaurding, rigidity or rebound. Extremities: B/L Lower Ext shows no edema, both legs are warm to touch  Neurology: Awake alert, and oriented X 3, CN II-XII intact, Non focal Skin: No Rash  Discharge Instructions  Discharge Instructions    Diet - low sodium heart healthy    Complete by:  As directed    Increase activity slowly    Complete by:  As directed      Allergies as of 08/23/2017   No Known Allergies     Medication List    TAKE these medications   aspirin EC 81 MG tablet Take 1 tablet (81 mg total) by mouth daily.    cyclobenzaprine 10 MG tablet Commonly known as:  FLEXERIL Take 1 tablet (10 mg total) by mouth 2 (two) times daily as needed for muscle spasms.   ibuprofen 600 MG tablet Commonly known as:  ADVIL,MOTRIN Take 1 tablet (600 mg total) by mouth every 6 (six) hours as needed.   morphine 60 MG 12 hr tablet Commonly known as:  MS CONTIN Take 1 tablet (60 mg total) by mouth every 12 (twelve) hours.   naproxen 500 MG tablet Commonly known as:  NAPROSYN Take 1 tablet (500 mg total) by mouth 2 (two) times daily.   Oxycodone HCl 10 MG Tabs Take 20 mg by mouth every 6 (six) hours as needed (PAIN).   potassium chloride 10 MEQ tablet Commonly known as:  K-DUR Take 1 tablet (10 mEq total) by mouth daily.      The results of significant diagnostics from this hospitalization (including imaging, microbiology, ancillary and laboratory) are listed below for reference.    Significant Diagnostic Studies: No results found.  Microbiology: No results found for this or any previous visit (from the past 240 hour(s)).   Labs: Basic Metabolic Panel:  Recent Labs Lab 08/20/17 2303 08/22/17 1131  NA 140 142  K 3.9 4.9  CL 106 107  CO2 23 22  GLUCOSE 90 88  BUN 8 <5*  CREATININE 0.56 0.49  CALCIUM 10.1 9.5   Liver Function Tests:  Recent Labs Lab 08/20/17 2303 08/22/17 1131  AST 32 51*  ALT 14 32  ALKPHOS 64 57  BILITOT 2.1* 3.3*  PROT 8.7* 7.2  ALBUMIN 4.5 3.8   No results for input(s): LIPASE, AMYLASE in the last 168 hours. No results for input(s): AMMONIA in the last 168 hours. CBC:  Recent Labs Lab 08/20/17 2303 08/22/17 1131  WBC 10.8* 11.1*  NEUTROABS 5.0 4.3  HGB 9.6* 8.0*  HCT 27.0* 21.6*  MCV 81.1 79.7  PLT 572* 411*   Cardiac Enzymes: No results for input(s): CKTOTAL, CKMB, CKMBINDEX, TROPONINI in the last 168 hours. BNP: Invalid input(s): POCBNP CBG: No results for input(s): GLUCAP in the last 168 hours.  Time coordinating discharge: 50  minutes  Signed:  Jibran Crookshanks, [facility] 08/23/2017, 10:06 AM

## 2017-08-23 NOTE — Discharge Instructions (Signed)
Sickle Cell Anemia, Adult °Sickle cell anemia is a condition in which red blood cells have an abnormal “sickle” shape. This abnormal shape shortens the cells’ life span, which results in a lower than normal concentration of red blood cells in the blood. The sickle shape also causes the cells to clump together and block free blood flow through the blood vessels. As a result, the tissues and organs of the body do not receive enough oxygen. Sickle cell anemia causes organ damage and pain and increases the risk of infection. °What are the causes? °Sickle cell anemia is a genetic disorder. Those who receive two copies of the gene have the condition, and those who receive one copy have the trait. °What increases the risk? °The sickle cell gene is most common in people whose families originated in [place]. Other areas of the globe where sickle cell trait occurs include the Mediterranean, [place] and [place], the [place], and the [place]. °What are the signs or symptoms? °· Pain, especially in the extremities, back, chest, or abdomen (common). The pain may start suddenly or may develop following an illness, especially if there is dehydration. Pain can also occur due to overexertion or exposure to extreme temperature changes. °· Frequent severe bacterial infections, especially certain types of pneumonia and meningitis. °· Pain and swelling in the hands and feet. °· Decreased activity. °· Loss of appetite. °· Change in behavior. °· Headaches. °· Seizures. °· Shortness of breath or difficulty breathing. °· Vision changes. °· Skin ulcers. °Those with the trait may not have symptoms or they may have mild symptoms. °How is this diagnosed? °Sickle cell anemia is diagnosed with blood tests that demonstrate the genetic trait. It is often diagnosed during the newborn period, due to mandatory testing nationwide. A variety of blood tests, X-rays, CT scans, MRI scans, ultrasounds, and lung function tests may also be done to  monitor the condition. °How is this treated? °Sickle cell anemia may be treated with: °· Medicines. You may be given pain medicines, antibiotic medicines (to treat and prevent infections) or medicines to increase the production of certain types of hemoglobin. °· Fluids. °· Oxygen. °· Blood transfusions. ° °Follow these instructions at home: °· Drink enough fluid to keep your urine clear or pale yellow. Increase your fluid intake in hot weather and during exercise. °· Do not smoke. Smoking lowers oxygen levels in the blood. °· Only take over-the-counter or prescription medicines for pain, fever, or discomfort as directed by your health care provider. °· Take antibiotics as directed by your health care provider. Make sure you finish them it even if you start to feel better. °· Take supplements as directed by your health care provider. °· Consider wearing a medical alert bracelet. This tells anyone caring for you in an emergency of your condition. °· When traveling, keep your medical information, health care provider's names, and the medicines you take with you at all times. °· If you develop a fever, do not take medicines to reduce the fever right away. This could cover up a problem that is developing. Notify your health care provider. °· Keep all follow-up appointments with your health care provider. Sickle cell anemia requires regular medical care. °Contact a health care provider if: °You have a fever. °Get help right away if: °· You feel dizzy or faint. °· You have new abdominal pain, especially on the left side near the stomach area. °· You develop a persistent, often uncomfortable and painful penile erection (priapism). If this is not   treated immediately it will lead to impotence. °· You have numbness your arms or legs or you have a hard time moving them. °· You have a hard time with speech. °· You have a fever or persistent symptoms for more than 2-3 days. °· You have a fever and your symptoms suddenly get  worse. °· You have signs or symptoms of infection. These include: °? Chills. °? Abnormal tiredness (lethargy). °? Irritability. °? Poor eating. °? Vomiting. °· You develop pain that is not helped with medicine. °· You develop shortness of breath. °· You have pain in your chest. °· You are coughing up pus-like or bloody sputum. °· You develop a stiff neck. °· Your feet or hands swell or have pain. °· Your abdomen appears bloated. °· You develop joint pain. °This information is not intended to replace advice given to you by your health care provider. Make sure you discuss any questions you have with your health care provider. °Document Released: 01/26/2006 Document Revised: 05/07/2016 Document Reviewed: 05/30/2013 °Elsevier Interactive Patient Education © 2017 [employer] ° °

## 2017-08-23 NOTE — Progress Notes (Signed)
Pt discharged to home, pt ambulated to the front lobby. Pt instructions given as ordered and acknowledge understanding. SRP, RN

## 2017-08-23 NOTE — Progress Notes (Signed)
Pt ask to taxi voucher to assist with transportation needs, pt states unable to walk distance due to leg pain. Offered bus pass pt declined stating to bus stop is too far and it may cause increase leg pain and start another "flare up". Social Worker called. SRP, RN

## 2017-08-23 NOTE — Progress Notes (Signed)
Patient discharged to home, pt PCA Dilaudid wasted in the sink 26 mg witnessed by Mickie Hillier, RN. Theora Gianotti, RN, BSN-BC

## 2017-08-23 NOTE — Progress Notes (Signed)
CSW informed by patient's RN that patient needs assistance with transportation. Patient's RN reported that patient has SOB and leg pain associated with sickle cell that prevents patient from riding the bus. CSW spoke with patient regarding transportation needs, patient reported that she does not have any family/friends that can assist with transportation and that she is unable to pay for a taxi. CSW provided patient with taxi voucher.   Abundio Miu, [place] Social Worker [facility] Cell#: [phone]

## 2017-08-24 ENCOUNTER — Emergency Department (HOSPITAL_COMMUNITY): Payer: Medicaid Other

## 2017-08-24 ENCOUNTER — Inpatient Hospital Stay (HOSPITAL_COMMUNITY)
Admission: EM | Admit: 2017-08-24 | Discharge: 2017-08-30 | DRG: 812 | Disposition: A | Discharge status: Home / Self Care | Payer: Medicaid Other | Attending: Internal Medicine | Admitting: Internal Medicine

## 2017-08-24 ENCOUNTER — Encounter (HOSPITAL_COMMUNITY): Payer: Self-pay

## 2017-08-24 DIAGNOSIS — E876 Hypokalemia: Secondary | ICD-10-CM | POA: Diagnosis present

## 2017-08-24 DIAGNOSIS — D72829 Elevated white blood cell count, unspecified: Secondary | ICD-10-CM | POA: Diagnosis present

## 2017-08-24 DIAGNOSIS — R7989 Other specified abnormal findings of blood chemistry: Secondary | ICD-10-CM | POA: Diagnosis present

## 2017-08-24 DIAGNOSIS — Z79899 Other long term (current) drug therapy: Secondary | ICD-10-CM

## 2017-08-24 DIAGNOSIS — D473 Essential (hemorrhagic) thrombocythemia: Secondary | ICD-10-CM | POA: Diagnosis present

## 2017-08-24 DIAGNOSIS — G894 Chronic pain syndrome: Secondary | ICD-10-CM | POA: Diagnosis present

## 2017-08-24 DIAGNOSIS — Z79891 Long term (current) use of opiate analgesic: Secondary | ICD-10-CM

## 2017-08-24 DIAGNOSIS — F603 Borderline personality disorder: Secondary | ICD-10-CM | POA: Diagnosis present

## 2017-08-24 DIAGNOSIS — D5700 Hb-SS disease with crisis, unspecified: Principal | ICD-10-CM | POA: Diagnosis present

## 2017-08-24 DIAGNOSIS — D75839 Thrombocytosis, unspecified: Secondary | ICD-10-CM | POA: Diagnosis present

## 2017-08-24 DIAGNOSIS — Z7982 Long term (current) use of aspirin: Secondary | ICD-10-CM

## 2017-08-24 LAB — COMPREHENSIVE METABOLIC PANEL
ALT: 23 U/L (ref 14–54)
AST: 26 U/L (ref 15–41)
Albumin: 4.1 g/dL (ref 3.5–5.0)
Alkaline Phosphatase: 63 U/L (ref 38–126)
Anion gap: 9 (ref 5–15)
BUN: 8 mg/dL (ref 6–20)
CHLORIDE: 107 mmol/L (ref 101–111)
CO2: 24 mmol/L (ref 22–32)
CREATININE: 0.48 mg/dL (ref 0.44–1.00)
Calcium: 9.4 mg/dL (ref 8.9–10.3)
GFR calc non Af Amer: >60 mL/min (ref >60)
Glucose, Bld: 87 mg/dL (ref 65–99)
Potassium: 3.3 mmol/L — ABNORMAL LOW (ref 3.5–5.1)
Sodium: 140 mmol/L (ref 135–145)
Total Bilirubin: 3.4 mg/dL — ABNORMAL HIGH (ref 0.3–1.2)
Total Protein: 7.9 g/dL (ref 6.5–8.1)

## 2017-08-24 LAB — CBC WITH DIFFERENTIAL/PLATELET
Basophils Absolute: 0.1 10*3/uL (ref 0.0–0.1)
Basophils Relative: 0 %
EOS ABS: 0.1 10*3/uL (ref 0.0–0.7)
Eosinophils Relative: 1 %
HCT: 23.9 % — ABNORMAL LOW (ref 36.0–46.0)
Hemoglobin: 8.8 g/dL — ABNORMAL LOW (ref 12.0–15.0)
LYMPHS PCT: 24 %
Lymphs Abs: 4.7 10*3/uL — ABNORMAL HIGH (ref 0.7–4.0)
MCH: 29.8 pg (ref 26.0–34.0)
MCHC: 36.8 g/dL — AB (ref 30.0–36.0)
MCV: 81 fL (ref 78.0–100.0)
MONOS PCT: 11 %
Monocytes Absolute: 2.1 10*3/uL — ABNORMAL HIGH (ref 0.1–1.0)
NEUTROS PCT: 65 %
Neutro Abs: 12.8 10*3/uL — ABNORMAL HIGH (ref 1.7–7.7)
Platelets: 537 10*3/uL — ABNORMAL HIGH (ref 150–400)
RBC: 2.95 MIL/uL — ABNORMAL LOW (ref 3.87–5.11)
RDW: 16.9 % — AB (ref 11.5–15.5)
WBC: 19.8 10*3/uL — ABNORMAL HIGH (ref 4.0–10.5)

## 2017-08-24 LAB — I-STAT BETA HCG BLOOD, ED (MC, WL, AP ONLY): I-stat hCG, quantitative: <5 m[IU]/mL (ref <5)

## 2017-08-24 LAB — RETICULOCYTES
RBC.: 2.95 MIL/uL — ABNORMAL LOW (ref 3.87–5.11)
RETIC CT PCT: 10.8 % — AB (ref 0.4–3.1)
Retic Count, Absolute: 318.6 10*3/uL — ABNORMAL HIGH (ref 19.0–186.0)

## 2017-08-24 MED ORDER — HYDROMORPHONE HCL 2 MG/ML IJ SOLN: 2.0000 mg | INTRAMUSCULAR | Status: AC

## 2017-08-24 MED ORDER — ONDANSETRON HCL 4 MG/2ML IJ SOLN
4.0000 mg | INTRAMUSCULAR | Status: DC | PRN
  Administered 2017-08-24: 4 mg via INTRAVENOUS
  Filled 2017-08-24: qty 2

## 2017-08-24 MED ORDER — SODIUM CHLORIDE 0.45 % IV SOLN
INTRAVENOUS | Status: DC
  Administered 2017-08-24: 125 mL/h via INTRAVENOUS

## 2017-08-24 MED ORDER — HYDROMORPHONE HCL 2 MG/ML IJ SOLN
2.0000 mg | INTRAMUSCULAR | Status: AC
  Administered 2017-08-24: 2 mg via INTRAVENOUS
  Filled 2017-08-24: qty 1

## 2017-08-24 MED ORDER — HYDROMORPHONE HCL 2 MG/ML IJ SOLN
2.0000 mg | INTRAMUSCULAR | Status: AC
  Administered 2017-08-25: 2 mg via INTRAVENOUS
  Filled 2017-08-24: qty 1

## 2017-08-24 MED ORDER — DIPHENHYDRAMINE HCL 25 MG PO CAPS
25.0000 mg | ORAL_CAPSULE | ORAL | Status: DC | PRN
  Administered 2017-08-24: 50 mg via ORAL
  Filled 2017-08-24: qty 2

## 2017-08-24 NOTE — ED Notes (Signed)
Bed: HK25 Expected date:  Expected time:  Means of arrival:  Comments: EMS sickle cell crisis

## 2017-08-24 NOTE — ED Provider Notes (Signed)
[facility] DEPT Provider Note   CSN: [id number] Arrival date & time: 08/24/17  2136     History   Chief Complaint Chief Complaint  Patient presents with  . Sickle Cell Pain Crisis  . Generalized pain    HPI ALECEA TREGO is a 26 y.o. female.  The history is provided by the patient and medical records.  Sickle Cell Pain Crisis  Associated symptoms: chest pain      26 year old female with history of headaches, or sickle cell disease, presenting to the ED for sickle cell pain crisis.  Reports she was discharged from the hospital yesterday morning and pain was under control at that time.  States she got home around noon and by yesterday afternoon reports she was very uncomfortable again.  States she has pain "all over".  She does not localize that one area is worse than the other.  She does report a little chest pain but denies any cough, fever, nasal congestion, or other upper respiratory symptoms.  No shortness of breath or palpitations.  States she has been taking her home medications without relief.  States she thinks the cold weather may be making her symptoms worse.  Past Medical History:  Diagnosis Date  . Abortion    05/2012  . Headache(784.0)   . Sickle cell crisis [facility])     Patient Active Problem List   Diagnosis Date Noted  . Sickle cell pain crisis ([place]) 08/21/2017  . Leukocytosis 08/21/2017  . Hypokalemia 06/27/2017  . 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 Sickle cell anemia with pain ([place]) 05/29/2017  . 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 Hb-SS disease without crisis ([place]) 08/15/2016  . Anemia of chronic disease   . S/P laparoscopic cholecystectomy 11/30/2014  . Thrombocytosis ([place]) 11/22/2014  . Systolic ejection murmur 53/97/6734    Past Surgical History:  Procedure Laterality Date  . CHOLECYSTECTOMY N/A 11/30/2014   Procedure: LAPAROSCOPIC  CHOLECYSTECTOMY SINGLE SITE WITH INTRAOPERATIVE CHOLANGIOGRAM;  Surgeon: Michael Boston, MD;  Location: [facility] ORS;  Service: General;  Laterality: N/A;  . SPLENECTOMY      OB History    Gravida Para Term Preterm AB Living   1       1     SAB TAB Ectopic Multiple Live Births                   Home Medications    Prior to Admission medications   Medication Sig Start Date End Date Taking? Authorizing Provider  aspirin EC 81 MG tablet Take 1 tablet (81 mg total) by mouth daily. 05/31/17  Yes Leana Gamer, MD  cyclobenzaprine (FLEXERIL) 10 MG tablet Take 1 tablet (10 mg total) by mouth 2 (two) times daily as needed for muscle spasms. 08/11/17  Yes Glyn Ade, PA-C  morphine (MS CONTIN) 60 MG 12 hr tablet Take 1 tablet (60 mg total) by mouth every 12 (twelve) hours. 05/31/17  Yes Leana Gamer, MD  naproxen (NAPROSYN) 500 MG tablet Take 1 tablet (500 mg total) by mouth 2 (two) times daily. 08/11/17  Yes Glyn Ade, PA-C  Oxycodone HCl 10 MG TABS Take 20 mg by mouth every 6 (six) hours as needed (PAIN).   Yes [provider]  ibuprofen (ADVIL,MOTRIN) 600 MG tablet Take 1 tablet (600 mg total) by mouth every 6 (six) hours as needed. Patient not taking: Reported on 07/24/2017 06/28/17  Leana Gamer, MD    Family History Family History  Problem Relation Age of Onset  . Hypertension Mother   . Sickle cell anemia Sister   . Kidney disease Sister        Lupus  . Arthritis Sister   . Sickle cell anemia Sister   . Sickle cell trait Sister   . Heart disease Maternal Aunt        CABG  . Heart disease Maternal Uncle        CABG  . Lupus Sister     Social History Social History  Substance Use Topics  . Smoking status: Never Smoker  . Smokeless tobacco: Never Used  . Alcohol use No     Allergies   Patient has no known allergies.   Review of Systems Review of Systems  Cardiovascular: Positive for chest pain.  Musculoskeletal: Positive for  arthralgias and myalgias.  All other systems reviewed and are negative.    Physical Exam Updated Vital Signs BP (!) 127/93   Pulse 76   Temp 98.3 F (36.8 C) (Oral)   Resp (!) 29   Ht 5\' 3"  (1.6 m)   Wt 76.7 kg (169 lb)   LMP 08/05/2017   SpO2 100%   BMI 29.94 kg/m   Physical Exam  Constitutional: She is oriented to person, place, and time. She appears well-developed and well-nourished.  HENT:  Head: Normocephalic and atraumatic.  Mouth/Throat: Oropharynx is clear and moist.  Eyes: Pupils are equal, round, and reactive to light. Conjunctivae and EOM are normal.  Neck: Normal range of motion.  Cardiovascular: Normal rate, regular rhythm and normal heart sounds.   Pulmonary/Chest: Effort normal and breath sounds normal. No respiratory distress. She has no wheezes.  Abdominal: Soft. Bowel sounds are normal. There is no tenderness. There is no rebound.  Musculoskeletal: Normal range of motion.  Neurological: She is alert and oriented to person, place, and time.  Skin: Skin is warm and dry.  Psychiatric: She has a normal mood and affect.  Nursing note and vitals reviewed.    ED Treatments / Results  Labs (all labs ordered are listed, but only abnormal results are displayed) Labs Reviewed  CBC WITH DIFFERENTIAL/PLATELET - Abnormal; Notable for the following:       Result Value   WBC 19.8 (*)    RBC 2.95 (*)    Hemoglobin 8.8 (*)    HCT 23.9 (*)    MCHC 36.8 (*)    RDW 16.9 (*)    Platelets 537 (*)    Neutro Abs 12.8 (*)    Lymphs Abs 4.7 (*)    Monocytes Absolute 2.1 (*)    All other components within normal limits  RETICULOCYTES - Abnormal; Notable for the following:    Retic Ct Pct 10.8 (*)    RBC. 2.95 (*)    Retic Count, Absolute 318.6 (*)    All other components within normal limits  COMPREHENSIVE METABOLIC PANEL  I-STAT BETA HCG BLOOD, ED ([facility], [facility], [facility] ONLY)    EKG  EKG Interpretation None       Radiology No results found.  Procedures Procedures  (including critical care time)  Medications Ordered in ED Medications  senna-docusate (Senokot-S) tablet 1 tablet (not administered)  polyethylene glycol (MIRALAX / GLYCOLAX) packet 17 g (not administered)  naloxone (NARCAN) injection 0.4 mg (not administered)    And  sodium chloride flush (NS) 0.9 % injection 9 mL (not administered)  ondansetron (ZOFRAN) injection 4 mg (not  administered)  diphenhydrAMINE (BENADRYL) injection 12.5 mg (not administered)    Or  diphenhydrAMINE (BENADRYL) 12.5 MG/5ML elixir 12.5 mg (not administered)  enoxaparin (LOVENOX) injection 40 mg (not administered)  ketorolac (TORADOL) 15 MG/ML injection 15 mg (not administered)  HYDROmorphone (DILAUDID) 1 mg/mL PCA injection (not administered)  potassium chloride (K-DUR,KLOR-CON) CR tablet 30 mEq (not administered)  HYDROmorphone (DILAUDID) injection 2 mg (2 mg Intravenous Given 08/24/17 2305)    Or  HYDROmorphone (DILAUDID) injection 2 mg ( Subcutaneous See Alternative 08/24/17 2305)  HYDROmorphone (DILAUDID) injection 2 mg (2 mg Intravenous Given 08/24/17 2333)    Or  HYDROmorphone (DILAUDID) injection 2 mg ( Subcutaneous See Alternative 08/24/17 2333)  HYDROmorphone (DILAUDID) injection 2 mg (2 mg Intravenous Given 08/25/17 0022)    Or  HYDROmorphone (DILAUDID) injection 2 mg ( Subcutaneous See Alternative 08/25/17 0022)     Initial Impression / Assessment and Plan / ED Course  I have reviewed the triage vital signs and the nursing notes.  Pertinent labs & imaging results that were available during my care of the patient were reviewed by me and considered in my medical decision making (see chart for details).  25 year old female here with sickle cell pain crisis.  Discharge from hospital yesterday for same.  She reports pain "all over".   She is afebrile, nontoxic.  Lungs are clear without wheezes or rhonchi.  Screening labs obtained from triage, overall reassuring.  She did report some chest pain.  Given  recent hospitalization, will need evaluation for acute chest.  EKG and chest x-ray ordered.  Will start sickle cell protocol.  Chest x-ray is clear.  EKG nonischemic.  Patient has had her 3 rounds of medications and reports she does not feel any better.  She does not feel that she can manage at home.  Discussed with hospitalist, Dr. Tamala Julian who will admit for ongoing care.  Final Clinical Impressions(s) / ED Diagnoses   Final diagnoses:  Sickle cell pain crisis [facility])    New Prescriptions New Prescriptions   No medications on file     Larene Pickett, PA-C 08/25/17 0207    Macarthur Critchley, MD 08/27/17 [phone]

## 2017-08-24 NOTE — ED Triage Notes (Signed)
Patient arrives by EMS with complaints of sickle cell crisis/generalized pain. HR 110 Recent hospitalization 10/20 for same complaint

## 2017-08-24 NOTE — ED Notes (Signed)
Patient stuck twice with no success.

## 2017-08-25 DIAGNOSIS — E876 Hypokalemia: Secondary | ICD-10-CM

## 2017-08-25 DIAGNOSIS — D72829 Elevated white blood cell count, unspecified: Secondary | ICD-10-CM | POA: Diagnosis present

## 2017-08-25 DIAGNOSIS — G894 Chronic pain syndrome: Secondary | ICD-10-CM | POA: Diagnosis present

## 2017-08-25 DIAGNOSIS — Z7982 Long term (current) use of aspirin: Secondary | ICD-10-CM | POA: Diagnosis not present

## 2017-08-25 DIAGNOSIS — D473 Essential (hemorrhagic) thrombocythemia: Secondary | ICD-10-CM | POA: Diagnosis not present

## 2017-08-25 DIAGNOSIS — R7989 Other specified abnormal findings of blood chemistry: Secondary | ICD-10-CM | POA: Diagnosis present

## 2017-08-25 DIAGNOSIS — D638 Anemia in other chronic diseases classified elsewhere: Secondary | ICD-10-CM | POA: Diagnosis not present

## 2017-08-25 DIAGNOSIS — R079 Chest pain, unspecified: Secondary | ICD-10-CM | POA: Diagnosis not present

## 2017-08-25 DIAGNOSIS — Z79891 Long term (current) use of opiate analgesic: Secondary | ICD-10-CM | POA: Diagnosis not present

## 2017-08-25 DIAGNOSIS — F603 Borderline personality disorder: Secondary | ICD-10-CM | POA: Diagnosis present

## 2017-08-25 DIAGNOSIS — D5700 Hb-SS disease with crisis, unspecified: Secondary | ICD-10-CM | POA: Diagnosis present

## 2017-08-25 DIAGNOSIS — Z79899 Other long term (current) drug therapy: Secondary | ICD-10-CM | POA: Diagnosis not present

## 2017-08-25 LAB — BASIC METABOLIC PANEL
Anion gap: 8 (ref 5–15)
BUN: 6 mg/dL (ref 6–20)
CALCIUM: 9.1 mg/dL (ref 8.9–10.3)
CO2: 24 mmol/L (ref 22–32)
CREATININE: 0.41 mg/dL — AB (ref 0.44–1.00)
Chloride: 106 mmol/L (ref 101–111)
GFR calc Af Amer: >60 mL/min (ref >60)
GFR calc non Af Amer: >60 mL/min (ref >60)
GLUCOSE: 87 mg/dL (ref 65–99)
Potassium: 3.9 mmol/L (ref 3.5–5.1)
Sodium: 138 mmol/L (ref 135–145)

## 2017-08-25 LAB — TROPONIN I

## 2017-08-25 MED ORDER — DIPHENHYDRAMINE HCL 25 MG PO CAPS: 25.0000 mg | ORAL_CAPSULE | ORAL | Status: DC | PRN

## 2017-08-25 MED ORDER — DEXTROSE-NACL 5-0.45 % IV SOLN
INTRAVENOUS | Status: DC
  Administered 2017-08-25: 100 mL/h via INTRAVENOUS
  Administered 2017-08-25 – 2017-08-30 (×12): via INTRAVENOUS

## 2017-08-25 MED ORDER — NALOXONE HCL 0.4 MG/ML IJ SOLN: 0.4000 mg | INTRAMUSCULAR | Status: DC | PRN

## 2017-08-25 MED ORDER — ASPIRIN EC 81 MG PO TBEC
81.0000 mg | DELAYED_RELEASE_TABLET | Freq: Every day | ORAL | Status: DC
  Administered 2017-08-26 – 2017-08-30 (×5): 81 mg via ORAL
  Filled 2017-08-25 (×5): qty 1

## 2017-08-25 MED ORDER — HYDROMORPHONE 1 MG/ML IV SOLN
INTRAVENOUS | Status: DC
  Administered 2017-08-25: 7.4 mg via INTRAVENOUS
  Administered 2017-08-25 (×2): 6.5 mg via INTRAVENOUS
  Administered 2017-08-25: 21:00:00 via INTRAVENOUS
  Administered 2017-08-26: 4 mg via INTRAVENOUS
  Administered 2017-08-26: 7.5 mg via INTRAVENOUS
  Administered 2017-08-26: 6.5 mg via INTRAVENOUS
  Administered 2017-08-26: 4.5 mg via INTRAVENOUS
  Administered 2017-08-26: 10 mg via INTRAVENOUS
  Administered 2017-08-26 – 2017-08-27 (×2): via INTRAVENOUS
  Administered 2017-08-27: 8.5 mg via INTRAVENOUS
  Administered 2017-08-27: 7 mg via INTRAVENOUS
  Administered 2017-08-27: 3.5 mg via INTRAVENOUS
  Administered 2017-08-27: 4.5 mg via INTRAVENOUS
  Administered 2017-08-27: 0.5 mg via INTRAVENOUS
  Administered 2017-08-28: 9.5 mg via INTRAVENOUS
  Administered 2017-08-28: 02:00:00 via INTRAVENOUS
  Administered 2017-08-28: 0.5 mg via INTRAVENOUS
  Administered 2017-08-28 (×3): 4.5 mg via INTRAVENOUS
  Administered 2017-08-28: 3 mg via INTRAVENOUS
  Administered 2017-08-28: 4 mg via INTRAVENOUS
  Administered 2017-08-29: 1.5 mg via INTRAVENOUS
  Filled 2017-08-25 (×5): qty 25

## 2017-08-25 MED ORDER — DIPHENHYDRAMINE HCL 12.5 MG/5ML PO ELIX: 12.5000 mg | ORAL_SOLUTION | Freq: Four times a day (QID) | ORAL | Status: DC | PRN

## 2017-08-25 MED ORDER — POTASSIUM CHLORIDE CRYS ER 20 MEQ PO TBCR
30.0000 meq | EXTENDED_RELEASE_TABLET | ORAL | Status: AC
  Administered 2017-08-25: 30 meq via ORAL
  Filled 2017-08-25: qty 1

## 2017-08-25 MED ORDER — SODIUM CHLORIDE 0.9% FLUSH: 9.0000 mL | INTRAVENOUS | Status: DC | PRN

## 2017-08-25 MED ORDER — ENOXAPARIN SODIUM 40 MG/0.4ML ~~LOC~~ SOLN
40.0000 mg | Freq: Every day | SUBCUTANEOUS | Status: DC
  Administered 2017-08-25 – 2017-08-29 (×5): 40 mg via SUBCUTANEOUS
  Filled 2017-08-25 (×6): qty 0.4

## 2017-08-25 MED ORDER — SENNOSIDES-DOCUSATE SODIUM 8.6-50 MG PO TABS
1.0000 | ORAL_TABLET | Freq: Two times a day (BID) | ORAL | Status: DC
  Administered 2017-08-25 – 2017-08-30 (×11): 1 via ORAL
  Filled 2017-08-25 (×12): qty 1

## 2017-08-25 MED ORDER — ONDANSETRON HCL 4 MG/2ML IJ SOLN
4.0000 mg | Freq: Four times a day (QID) | INTRAMUSCULAR | Status: DC | PRN
  Administered 2017-08-26 – 2017-08-29 (×3): 4 mg via INTRAVENOUS
  Filled 2017-08-25 (×3): qty 2

## 2017-08-25 MED ORDER — POLYETHYLENE GLYCOL 3350 17 G PO PACK: 17.0000 g | PACK | Freq: Every day | ORAL | Status: DC | PRN

## 2017-08-25 MED ORDER — SODIUM CHLORIDE 0.9 % IV SOLN
25.0000 mg | INTRAVENOUS | Status: DC | PRN
  Filled 2017-08-25: qty 0.5

## 2017-08-25 MED ORDER — HYDROMORPHONE 1 MG/ML IV SOLN
INTRAVENOUS | Status: DC
  Administered 2017-08-25: 4.7 mg via INTRAVENOUS
  Administered 2017-08-25: 04:00:00 via INTRAVENOUS

## 2017-08-25 MED ORDER — CYCLOBENZAPRINE HCL 10 MG PO TABS: 10.0000 mg | ORAL_TABLET | Freq: Two times a day (BID) | ORAL | Status: DC | PRN

## 2017-08-25 MED ORDER — NALOXONE HCL 0.4 MG/ML IJ SOLN
0.4000 mg | INTRAMUSCULAR | Status: DC | PRN
Start: 2017-08-25 — End: 2017-08-25

## 2017-08-25 MED ORDER — TRAZODONE HCL 50 MG PO TABS
50.0000 mg | ORAL_TABLET | Freq: Once | ORAL | Status: AC
  Administered 2017-08-25: 50 mg via ORAL
  Filled 2017-08-25: qty 1

## 2017-08-25 MED ORDER — DIPHENHYDRAMINE HCL 50 MG/ML IJ SOLN: 12.5000 mg | Freq: Four times a day (QID) | INTRAMUSCULAR | Status: DC | PRN

## 2017-08-25 MED ORDER — KETOROLAC TROMETHAMINE 15 MG/ML IJ SOLN
15.0000 mg | Freq: Four times a day (QID) | INTRAMUSCULAR | Status: DC
  Administered 2017-08-25 (×2): 15 mg via INTRAVENOUS
  Filled 2017-08-25 (×2): qty 1

## 2017-08-25 MED ORDER — KETOROLAC TROMETHAMINE 15 MG/ML IJ SOLN
30.0000 mg | Freq: Four times a day (QID) | INTRAMUSCULAR | Status: AC
Start: 2017-08-25 — End: 2017-08-29
  Administered 2017-08-25 – 2017-08-29 (×17): 30 mg via INTRAVENOUS
  Filled 2017-08-25 (×18): qty 2

## 2017-08-25 MED ORDER — ONDANSETRON HCL 4 MG/2ML IJ SOLN: 4.0000 mg | Freq: Four times a day (QID) | INTRAMUSCULAR | Status: DC | PRN

## 2017-08-25 MED ORDER — MORPHINE SULFATE ER 30 MG PO TBCR
60.0000 mg | EXTENDED_RELEASE_TABLET | Freq: Two times a day (BID) | ORAL | Status: DC
  Administered 2017-08-25 – 2017-08-30 (×10): 60 mg via ORAL
  Filled 2017-08-25 (×11): qty 2

## 2017-08-25 NOTE — ED Notes (Signed)
Please call Jebidiah Baggerly for report @0300 . Phone # [phone]

## 2017-08-25 NOTE — Plan of Care (Signed)
Problem: Safety: Goal: Ability to remain free from injury will improve Outcome: Completed/Met Date Met: 08/25/17 Bed alarm in place, pt is aware to call for asisitance

## 2017-08-25 NOTE — Progress Notes (Addendum)
Patient requesting ambien 5mg  PO for HS for insomnia. Paged Jeannette Corpus. New order for one time dose trazodone PO ordered. Continue to monitor.

## 2017-08-25 NOTE — Progress Notes (Signed)
Replaced diluadid PCA syringe. Wasted 1 lm diluadid which was witnessed by [employer], Therapist, sports.

## 2017-08-25 NOTE — Progress Notes (Signed)
SICKLE CELL SERVICE PROGRESS NOTE  JOPLIN CANTY CHE:[id number] DOB: 02-22-1991 DOA: 08/24/2017 PCP: [name], MD  Assessment/Plan: Principal Problem:   Sickle cell pain crisis [facility]) Active Problems:   Thrombocytosis (HCC)   Hypokalemia   Leukocytosis  1. Hb SS with crisis: Continue PCA at adjusted dose. Continue Toradol and IVF.  2. Chronic Pain Syndrome: Continue  MS Contin 3. Anemia of Chronic Disease: Hb stable at baseline.  4. Borderline Personality Disorder: So far no issues.  Code Status: Full Code Family Communication: N/A Disposition Plan: Not yet ready for discharge  [place].  Pager [phone]. If 7PM-7AM, please contact night-coverage.  08/25/2017, 3:33 PM  LOS: 0 days   Interim History: Pt reports pain now 5/10 in arms and 9/10 in back and legs.   Consultants:  None  Procedures:  None  Antibiotics:  None   Objective: Vitals:   08/25/17 0352 08/25/17 0800 08/25/17 0900 08/25/17 1211  BP:      Pulse:      Resp: 17 16 18 18   Temp:      TempSrc:      SpO2: 95% 95% 92% 92%  Weight:      Height:       Weight change:   Intake/Output Summary (Last 24 hours) at 08/25/17 1533 Last data filed at 08/25/17 0600  Gross per 24 hour  Intake              845 ml  Output                0 ml  Net              845 ml     Physical Exam General: Alert, awake, oriented x3, in no acute distress.  HEENT: [place]/AT PEERL, EOMI, anicteric Neck: Trachea midline,  no masses, no thyromegal,y no JVD, no carotid bruit OROPHARYNX:  Moist, No exudate/ erythema/lesions.  Heart: Regular rate and rhythm, without murmurs, rubs, gallops, PMI non-displaced, no heaves or thrills on palpation.  Lungs: Clear to auscultation, no wheezing or rhonchi noted. No increased vocal fremitus resonant to percussion  Abdomen: Soft, nontender, nondistended, positive bowel sounds, no masses no hepatosplenomegaly noted..  Neuro: No focal neurological deficits noted cranial  nerves II through XII grossly intact. Strength at baseline in bilateral upper and lower extremities. Musculoskeletal: No warmth swelling or erythema around joints, no spinal tenderness noted. Psychiatric: Patient alert and oriented x3, good insight and cognition, good recent to remote recall.     Data Reviewed: Basic Metabolic Panel:  Recent Labs Lab 08/20/17 2303 08/22/17 1131 08/24/17 2145 08/25/17 0800  NA 140 142 140 138  K 3.9 4.9 3.3* 3.9  CL 106 107 107 106  CO2 23 22 24 24   GLUCOSE 90 88 87 87  BUN 8 <5* 8 6  CREATININE 0.56 0.49 0.48 0.41*  CALCIUM 10.1 9.5 9.4 9.1   Liver Function Tests:  Recent Labs Lab 08/20/17 2303 08/22/17 1131 08/24/17 2145  AST 32 51* 26  ALT 14 32 23  ALKPHOS 64 57 63  BILITOT 2.1* 3.3* 3.4*  PROT 8.7* 7.2 7.9  ALBUMIN 4.5 3.8 4.1   No results for input(s): LIPASE, AMYLASE in the last 168 hours. No results for input(s): AMMONIA in the last 168 hours. CBC:  Recent Labs Lab 08/20/17 2303 08/22/17 1131 08/24/17 2145  WBC 10.8* 11.1* 19.8*  NEUTROABS 5.0 4.3 12.8*  HGB 9.6* 8.0* 8.8*  HCT 27.0* 21.6* 23.9*  MCV 81.1 79.7 81.0  PLT 572* 411* 537*   Cardiac Enzymes:  Recent Labs Lab 08/25/17 1353  TROPONINI <0.03   BNP (last 3 results) No results for input(s): BNP in the last 8760 hours.  ProBNP (last 3 results) No results for input(s): PROBNP in the last 8760 hours.  CBG: No results for input(s): GLUCAP in the last 168 hours.  No results found for this or any previous visit (from the past 240 hour(s)).   Studies: Dg Chest 2 View  Result Date: 08/24/2017 CLINICAL DATA:  26 year old female with chest pain. Concern for sickle cell crisis. EXAM: CHEST  2 VIEW COMPARISON:  Chest radiograph dated 07/16/2017 FINDINGS: The lungs are clear. Ill-defined density over the left lung base likely superimposition of gastric content. There is no pleural effusion or pneumothorax. The cardiac silhouette is within normal limits. No  acute osseous pathology identified. IMPRESSION: No active cardiopulmonary disease. Electronically Signed   By: Anner Crete M.D.   On: 08/24/2017 23:27    Scheduled Meds: . aspirin EC  81 mg Oral Daily  . enoxaparin (LOVENOX) injection  40 mg Subcutaneous QHS  . HYDROmorphone   Intravenous Q4H  . ketorolac  30 mg Intravenous Q6H  . morphine  60 mg Oral Q12H  . senna-docusate  1 tablet Oral BID   Continuous Infusions: . dextrose 5 % and 0.45% NaCl 100 mL/hr (08/25/17 0233)  . diphenhydrAMINE (BENADRYL) IVPB(SICKLE CELL ONLY)      Principal Problem:   Sickle cell pain crisis ([place]) Active Problems:   Thrombocytosis (HCC)   Hypokalemia   Leukocytosis     In excess of 40 minutes spent during this visit. Greater than 50% involved face to face contact with the patient for assessment, counseling and coordination of care.

## 2017-08-25 NOTE — Care Management Note (Signed)
Case Management Note  Patient Details  Name: Carmen [name] MRN: [id number] Date of Birth: 1990-12-17  Subjective/Objective: Received CM cons for insurance, & med asst. [facility]-spoke to [facility] CM([phone])-They are processing the [employer] Sickle Cell Disease(D[facility])  Program for recertification-(medical care related for hospital , & medications) Drug formulary on shadow chart. Will contact Dr. Alyson Ingles office to inform of prior auth for meds tel#[phone].Patient uses [facility] [address]. Informed admitting of [facility] Sickle Cell Disease Program for billing contact tel#.                   Action/Plan:d/c plan home.   Expected Discharge Date:                  Expected Discharge Plan:  Home/Self Care  In-House Referral:     Discharge planning Services  CM Consult  Post Acute Care Choice:    Choice offered to:     DME Arranged:    DME Agency:     HH Arranged:    HH Agency:     Status of Service:  In process, will continue to follow  If discussed at Long Length of Stay Meetings, dates discussed:    Additional Comments:  Dessa Phi, RN 08/25/2017, 3:28 PM

## 2017-08-25 NOTE — Progress Notes (Signed)
CSW acknowledged consult for patient's lapse in insurance and inability to afford medication. RNCM following patient for assistance with medication and reports speaking with [facility] and [facility] regarding patient's insurance. CSW signing off, no other needs identified.   Abundio Miu, [place] Social Worker [facility] Cell#: [phone]

## 2017-08-25 NOTE — ED Notes (Signed)
Patient states the pain medication is not working for her ans she would like to be admitted.

## 2017-08-25 NOTE — H&P (Addendum)
History and Physical    Carmen [name] VPX:[id number] DOB: 1990/12/15 DOA: 08/24/2017  Referring MD/NP/PA:  Quincy Carnes PA-C PCP: [name], MD  Patient coming from: Home  Chief Complaint: pain  HPI: Carmen [name] is a 26 y.o. 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 Patient reports when she was just discharged from hospital yesterday morning that her pain was under adequate control, but around noon began generalized pain all over. Reports not having any of her pain medications at home because of a lapse in insurance. She has a sickle cell counselor, but they have not been able to get things sorted out yet. She complains of having some chest pain, but states the pain symptoms similar to previous sickle cell crises.Denies having fever, chills, nausea, vomiting, abdominal pain, diarrhea, shortness of breath, dysuria,and joint swelling.  Course: Upon admission into the emergency department patient is noted to be afebrile, pulse 74-104, respirations 14-29, and all other vital signs maintained. Labs revealed WBC 19.8, hemoglobin 8.8, platelets 537, and potassium 3.3. Chest x-ray showed no acute signs of infection.  Review of Systems  Constitutional: Positive for malaise/fatigue. Negative for chills and fever.  HENT: Negative for ear discharge and ear pain.   Eyes: Negative for double vision and photophobia.  Respiratory: Negative for sputum production.   Cardiovascular: Positive for chest pain. Negative for palpitations and leg swelling.  Gastrointestinal: Negative for abdominal pain, nausea and vomiting.  Genitourinary: Negative for dysuria and frequency.  Musculoskeletal: Positive for myalgias. Negative for falls.       Negative for joint swelling  Skin: Negative for itching and rash.  Neurological: Negative for speech change, seizures and weakness.    Endo/Heme/Allergies: Negative for polydipsia.  Psychiatric/Behavioral: Negative for memory loss and suicidal ideas. The patient is not nervous/anxious.     Past Medical History:  Diagnosis Date  . Abortion    05/2012  . Headache(784.0)   . Sickle cell crisis [facility])     Past Surgical History:  Procedure Laterality Date  . CHOLECYSTECTOMY N/A 11/30/2014   Procedure: LAPAROSCOPIC CHOLECYSTECTOMY SINGLE SITE WITH INTRAOPERATIVE CHOLANGIOGRAM;  Surgeon: Michael Boston, MD;  Location: [facility] ORS;  Service: General;  Laterality: N/A;  . SPLENECTOMY       reports that she has never smoked. She has never used smokeless tobacco. She reports that she does not drink alcohol or use drugs.  No Known Allergies  Family History  Problem Relation Age of Onset  . Hypertension Mother   . Sickle cell anemia Sister   . Kidney disease Sister        Lupus  . Arthritis Sister   . Sickle cell anemia Sister   . Sickle cell trait Sister   . Heart disease Maternal Aunt        CABG  . Heart disease Maternal Uncle        CABG  . Lupus Sister     Prior to Admission medications   Medication Sig Start Date End Date Taking? Authorizing Provider  aspirin EC 81 MG tablet Take 1 tablet (81 mg total) by mouth daily. 05/31/17  Yes Leana Gamer, MD  cyclobenzaprine (FLEXERIL) 10 MG tablet Take 1 tablet (10 mg total) by mouth 2 (two) times daily as needed for muscle spasms. 08/11/17  Yes Glyn Ade, PA-C  morphine (MS CONTIN) 60 MG 12 hr tablet Take 1 tablet (60 mg total) by  mouth every 12 (twelve) hours. 05/31/17  Yes Leana Gamer, MD  naproxen (NAPROSYN) 500 MG tablet Take 1 tablet (500 mg total) by mouth 2 (two) times daily. 08/11/17  Yes Glyn Ade, PA-C  Oxycodone HCl 10 MG TABS Take 20 mg by mouth every 6 (six) hours as needed (PAIN).   Yes [provider]  ibuprofen (ADVIL,MOTRIN) 600 MG tablet Take 1 tablet (600 mg total) by mouth every 6 (six) hours as needed. Patient  not taking: Reported on 07/24/2017 06/28/17   Leana Gamer, MD    Physical Exam:  Constitutional: young female who is NAD, calm, comfortable Vitals:   08/24/17 2330 08/25/17 0000 08/25/17 0040 08/25/17 0130  BP: 122/89 (!) 131/98 134/88 125/89  Pulse: 74 (!) 101  93  Resp: 20 14 20 15   Temp:      TempSrc:      SpO2: 100% 100%  100%  Weight:      Height:       Eyes: PERRL, lids and conjunctivae normal ENMT: Mucous membranes are moist. Posterior pharynx clear of any exudate or lesions.Normal dentition.  Neck: normal, supple, no masses, no thyromegaly Respiratory: clear to auscultation bilaterally, no wheezing, no crackles. Normal respiratory effort. No accessory muscle use.  Cardiovascular: Regular rate and rhythm, positive systolic ejection murmur.No rubs / gallops. No extremity edema. 2+ pedal pulses. No carotid bruits.  Abdomen: no tenderness, no masses palpated. No hepatosplenomegaly. Bowel sounds positive.  Musculoskeletal: no clubbing / cyanosis. No joint deformity upper and lower extremities. Good ROM, no contractures. Normal muscle tone.  Generalized tenderness of the bilateral lower extremities noted. No joint swelling appreciated. Skin: no rashes, lesions, ulcers. No induration Neurologic: CN 2-12 grossly intact. Sensation intact, DTR normal. Strength 5/5 in all 4.  Psychiatric: Normal judgment and insight. Alert and oriented x 3. Anxious mood.     Labs on Admission: I have personally reviewed following labs and imaging studies  CBC:  Recent Labs Lab 08/20/17 2303 08/22/17 1131 08/24/17 2145  WBC 10.8* 11.1* 19.8*  NEUTROABS 5.0 4.3 12.8*  HGB 9.6* 8.0* 8.8*  HCT 27.0* 21.6* 23.9*  MCV 81.1 79.7 81.0  PLT 572* 411* 176*   Basic Metabolic Panel:  Recent Labs Lab 08/20/17 2303 08/22/17 1131 08/24/17 2145  NA 140 142 140  K 3.9 4.9 3.3*  CL 106 107 107  CO2 23 22 24   GLUCOSE 90 88 87  BUN 8 <5* 8  CREATININE 0.56 0.49 0.48  CALCIUM 10.1 9.5 9.4    GFR: Estimated Creatinine Clearance: 106.3 mL/min (by C-G formula based on SCr of 0.48 mg/dL). Liver Function Tests:  Recent Labs Lab 08/20/17 2303 08/22/17 1131 08/24/17 2145  AST 32 51* 26  ALT 14 32 23  ALKPHOS 64 57 63  BILITOT 2.1* 3.3* 3.4*  PROT 8.7* 7.2 7.9  ALBUMIN 4.5 3.8 4.1   No results for input(s): LIPASE, AMYLASE in the last 168 hours. No results for input(s): AMMONIA in the last 168 hours. Coagulation Profile: No results for input(s): INR, PROTIME in the last 168 hours. Cardiac Enzymes: No results for input(s): CKTOTAL, CKMB, CKMBINDEX, TROPONINI in the last 168 hours. BNP (last 3 results) No results for input(s): PROBNP in the last 8760 hours. HbA1C: No results for input(s): HGBA1C in the last 72 hours. CBG: No results for input(s): GLUCAP in the last 168 hours. Lipid Profile: No results for input(s): CHOL, HDL, LDLCALC, TRIG, CHOLHDL, LDLDIRECT in the last 72 hours. Thyroid Function Tests: No results  for input(s): TSH, T4TOTAL, FREET4, T3FREE, THYROIDAB in the last 72 hours. Anemia Panel:  Recent Labs  08/24/17 2145  RETICCTPCT 10.8*   Urine analysis:    Component Value Date/Time   COLORURINE YELLOW 06/27/2017 0704   APPEARANCEUR HAZY (A) 06/27/2017 0704   LABSPEC 1.009 06/27/2017 0704   PHURINE 7.0 06/27/2017 0704   GLUCOSEU NEGATIVE 06/27/2017 0704   HGBUR NEGATIVE 06/27/2017 0704   BILIRUBINUR NEGATIVE 06/27/2017 0704   KETONESUR NEGATIVE 06/27/2017 0704   PROTEINUR NEGATIVE 06/27/2017 0704   UROBILINOGEN 1.0 09/12/2015 1548   NITRITE NEGATIVE 06/27/2017 0704   LEUKOCYTESUR NEGATIVE 06/27/2017 0704   Sepsis Labs: No results found for this or any previous visit (from the past 240 hour(s)).   Radiological Exams on Admission: Dg Chest 2 View  Result Date: 08/24/2017 CLINICAL DATA:  26 year old female with chest pain. Concern for sickle cell crisis. EXAM: CHEST  2 VIEW COMPARISON:  Chest radiograph dated 07/16/2017 FINDINGS: The lungs  are clear. Ill-defined density over the left lung base likely superimposition of gastric content. There is no pleural effusion or pneumothorax. The cardiac silhouette is within normal limits. No acute osseous pathology identified. IMPRESSION: No active cardiopulmonary disease. Electronically Signed   By: Anner Crete M.D.   On: 08/24/2017 23:27    EKG: Independently reviewed. Sinus rhythm at 96 bpm  Assessment/Plan Sickle cell anemia with pain crisis: Patient does not endorse any signs of infection.hemoglobin appears to be stable at 8.8 with - Admit to telemetry bed - Sickle cell order set initiated - high-dose PCA pump per protocol - D5 1/2 NS at 17ml/hr - Ketorolac IV  - social work and case management for issues with insurance and obtaining medications  Leukocytosis: acute on chronic. WBC trending of her chest to 19.8 on admission - continue to monitor repeat CBC in a.m. - consider need of antibiotics if showing any signs of infection  Hypokalemia: acute. Initial potassium 3.3 - Give 30 mEq of potassium chloride 1 dose now - continue to monitor and replace as needed  Thrombocytosis: Chronic. Platelet count 537 on admission. - continue to monitor DVT prophylaxis: Lovenox   Code Status: full Family Communication: no family present at bedside Disposition Plan: discharge home once pain under control. Consults called:  none Admission status: observation  Norval Morton MD [facility] Pager [phone]   If 7PM-7AM, please contact night-coverage [email or web address] Password [email or web address]  08/25/2017, 1:46 AM

## 2017-08-25 NOTE — Progress Notes (Signed)
   08/25/17 1000  Clinical Encounter Type  Visited With Patient;Other (Comment) (Nurse Present )  Visit Type Follow-up;Psychological support;Spiritual support  Referral From Nurse  Consult/Referral To Chaplain  Spiritual Encounters  Spiritual Needs Other (Comment);Emotional (Advance directive )  Stress Factors  Patient Stress Factors Financial concerns;Health changes;Major life changes;Other (Comment)  Advance Directives (For Healthcare)  Does Patient Have a Medical Advance Directive? No  Would patient like information on creating a medical advance directive? No - Patient declined   I visited with the patient per [name] consult for an Advance Directive. I requested that the Charge Nurse be present during my visit due to previous manipulative behavior.  The patient was upset when we arrived due to financial issues, insurance, and the inability to get her medication. The patient was very tearful and anxious over her insurance being cancelled.  The patient states that she has good emotional support from her family.  We talked about the Advance Directive and she declined at this time.   Please, contact Spiritual Care for further assistance.   [place] M.Div.

## 2017-08-26 DIAGNOSIS — D5700 Hb-SS disease with crisis, unspecified: Principal | ICD-10-CM

## 2017-08-26 MED ORDER — TRAZODONE HCL 50 MG PO TABS
50.0000 mg | ORAL_TABLET | Freq: Once | ORAL | Status: AC
  Administered 2017-08-26: 50 mg via ORAL
  Filled 2017-08-26: qty 1

## 2017-08-26 NOTE — Progress Notes (Signed)
SICKLE CELL SERVICE PROGRESS NOTE  Carmen [name] GUR:[id number] DOB: 10-10-1991 DOA: 08/24/2017 PCP: [name], MD  Assessment/Plan: Principal Problem:   Sickle cell pain crisis [facility]) Active Problems:   Thrombocytosis (HCC)   Hypokalemia   Leukocytosis  1. Hb SS with crisis: Continue PCA and Toradol at current dose. Decrease IVF as patient eating and drinking well. .  2. Chronic Pain Syndrome: Continue  MS Contin 3. Anemia of Chronic Disease: Hb stable at baseline.  4. Borderline Personality Disorder: So far no issues.  Code Status: Full Code Family Communication: N/A Disposition Plan: Not yet ready for discharge  [place].  Pager [phone]. If 7PM-7AM, please contact night-coverage.  08/26/2017, 2:15 PM  LOS: 1 day   Interim History: Pt reports pain now 4/10 in arms and 8/10 in back and legs. She has used 33.5 mg of Dilaudid with 68/67:demands/deliveries in the past 24 hours. She reports that she had a BM this morning.   Consultants:  None  Procedures:  None  Antibiotics:  None   Objective: Vitals:   08/26/17 0643 08/26/17 0811 08/26/17 1200 08/26/17 1316  BP: (!) 143/77   118/83  Pulse: 74   80  Resp: 10 13 12 14   Temp: 98.7 F (37.1 C)   99 F (37.2 C)  TempSrc: Oral   Oral  SpO2: 96% 96% 97% 91%  Weight:      Height:       Weight change:  No intake or output data in the 24 hours ending 08/26/17 1415   Physical Exam General: Alert, awake, oriented x3, in no acute distress. Very pleasant mood. HEENT: [place]/AT PEERL, EOMI, anicteric.  Heart: Regular rate and rhythm, without murmurs, rubs, gallops, PMI non-displaced, no heaves or thrills on palpation.  Lungs: Clear to auscultation, no wheezing or rhonchi noted. No increased vocal fremitus resonant to percussion  Abdomen: Soft, nontender, nondistended, positive bowel sounds, no masses no hepatosplenomegaly noted..  Neuro: No focal neurological deficits noted cranial nerves II through  XII grossly intact. Strength at baseline in bilateral upper and lower extremities. Musculoskeletal: No warmth swelling or erythema around joints, no spinal tenderness noted. Psychiatric: Patient alert and oriented x3, good insight and cognition, good recent to remote recall.     Data Reviewed: Basic Metabolic Panel:  Recent Labs Lab 08/20/17 2303 08/22/17 1131 08/24/17 2145 08/25/17 0800  NA 140 142 140 138  K 3.9 4.9 3.3* 3.9  CL 106 107 107 106  CO2 23 22 24 24   GLUCOSE 90 88 87 87  BUN 8 <5* 8 6  CREATININE 0.56 0.49 0.48 0.41*  CALCIUM 10.1 9.5 9.4 9.1   Liver Function Tests:  Recent Labs Lab 08/20/17 2303 08/22/17 1131 08/24/17 2145  AST 32 51* 26  ALT 14 32 23  ALKPHOS 64 57 63  BILITOT 2.1* 3.3* 3.4*  PROT 8.7* 7.2 7.9  ALBUMIN 4.5 3.8 4.1   No results for input(s): LIPASE, AMYLASE in the last 168 hours. No results for input(s): AMMONIA in the last 168 hours. CBC:  Recent Labs Lab 08/20/17 2303 08/22/17 1131 08/24/17 2145  WBC 10.8* 11.1* 19.8*  NEUTROABS 5.0 4.3 12.8*  HGB 9.6* 8.0* 8.8*  HCT 27.0* 21.6* 23.9*  MCV 81.1 79.7 81.0  PLT 572* 411* 537*   Cardiac Enzymes:  Recent Labs Lab 08/25/17 1353  TROPONINI <0.03   BNP (last 3 results) No results for input(s): BNP in the last 8760 hours.  ProBNP (last 3 results) No results for input(s): PROBNP  in the last 8760 hours.  CBG: No results for input(s): GLUCAP in the last 168 hours.  No results found for this or any previous visit (from the past 240 hour(s)).   Studies: Dg Chest 2 View  Result Date: 08/24/2017 CLINICAL DATA:  26 year old female with chest pain. Concern for sickle cell crisis. EXAM: CHEST  2 VIEW COMPARISON:  Chest radiograph dated 07/16/2017 FINDINGS: The lungs are clear. Ill-defined density over the left lung base likely superimposition of gastric content. There is no pleural effusion or pneumothorax. The cardiac silhouette is within normal limits. No acute osseous  pathology identified. IMPRESSION: No active cardiopulmonary disease. Electronically Signed   By: Anner Crete M.D.   On: 08/24/2017 23:27    Scheduled Meds: . aspirin EC  81 mg Oral Daily  . enoxaparin (LOVENOX) injection  40 mg Subcutaneous QHS  . HYDROmorphone   Intravenous Q4H  . ketorolac  30 mg Intravenous Q6H  . morphine  60 mg Oral Q12H  . senna-docusate  1 tablet Oral BID   Continuous Infusions: . dextrose 5 % and 0.45% NaCl 100 mL/hr at 08/26/17 1230  . diphenhydrAMINE (BENADRYL) IVPB(SICKLE CELL ONLY)      Principal Problem:   Sickle cell pain crisis ([place]) Active Problems:   Thrombocytosis (HCC)   Hypokalemia   Leukocytosis     In excess of 25 minutes spent during this visit. Greater than 50% involved face to face contact with the patient for assessment, counseling and coordination of care.

## 2017-08-26 NOTE — Progress Notes (Signed)
Per [employer] agency CM Carmen [name]-Patient has been approved for [facility] Sickle Cell Program.Faxed w/confirmation drug formulary med list that needs prior auth to Dr. Noland Fordyce office. Informed patient-voiced understanding.

## 2017-08-26 NOTE — Progress Notes (Signed)
Attempted to contact Dr. Gwynneth Munson McKenzie's office tel#[phone](line busy).

## 2017-08-26 NOTE — Progress Notes (Addendum)
08/26/17  1334  Patient states that case management needs to speak with her sickle cell counselor. Called [place] waiting for a call back. Spoke with Juliann Pulse. All forms have been turned in. Waiting for PCP to do prior authorization for her medicines.

## 2017-08-26 NOTE — Progress Notes (Signed)
TC Dr. York Pellant office-spoke to Peggy-faxed w/confirmation([phone])-drug formulary, needs prior auth.

## 2017-08-27 LAB — BASIC METABOLIC PANEL
ANION GAP: 11 (ref 5–15)
BUN: 6 mg/dL (ref 6–20)
CHLORIDE: 103 mmol/L (ref 101–111)
CO2: 24 mmol/L (ref 22–32)
Calcium: 8.9 mg/dL (ref 8.9–10.3)
Creatinine, Ser: 0.48 mg/dL (ref 0.44–1.00)
GFR calc Af Amer: >60 mL/min (ref >60)
GFR calc non Af Amer: >60 mL/min (ref >60)
GLUCOSE: 100 mg/dL — AB (ref 65–99)
POTASSIUM: 4.4 mmol/L (ref 3.5–5.1)
Sodium: 138 mmol/L (ref 135–145)

## 2017-08-27 MED ORDER — TRAZODONE HCL 50 MG PO TABS
50.0000 mg | ORAL_TABLET | Freq: Once | ORAL | Status: AC
  Administered 2017-08-27: 50 mg via ORAL
  Filled 2017-08-27: qty 1

## 2017-08-27 NOTE — Progress Notes (Signed)
Subjective: This a 26 year old female admitted with sickle cell painful crisis. Patient has had problem with outpatient medications. She is currently on Dilaudid PCA with Toradol and IV fluids. Pain is a 7 out of 10. Mainly at the lower back as well as legs. She is able to move around. She has used 27.5 mg with 56 demands and 55 deliveries in the last 24 hours. No nausea vomiting or diarrhea.  Objective: Vital signs in last 24 hours: Temp:  [98.5 F (36.9 C)-99 F (37.2 C)] 98.7 F (37.1 C) (10/27 0524) Pulse Rate:  [80-97] 97 (10/27 0524) Resp:  [10-16] 16 (10/27 0823) BP: (109-125)/(59-83) 109/59 (10/27 0524) SpO2:  [91 %-99 %] 97 % (10/27 0524) Weight change:  Last BM Date: 08/26/17  Intake/Output from previous day: 10/26 0701 - 10/27 0700 In: 2300 [I.V.:2300] Out: -  Intake/Output this shift: No intake/output data recorded.  General appearance: alert, cooperative, appears stated age and no distress Neck: no adenopathy, no carotid bruit, no JVD, supple, symmetrical, trachea midline and thyroid not enlarged, symmetric, no tenderness/mass/nodules Back: symmetric, no curvature. ROM normal. No CVA tenderness. Resp: clear to auscultation bilaterally Chest wall: no tenderness Cardio: regular rate and rhythm, S1, S2 normal, no murmur, click, rub or gallop GI: soft, non-tender; bowel sounds normal; no masses,  no organomegaly Extremities: extremities normal, atraumatic, no cyanosis or edema Pulses: 2+ and symmetric Skin: Skin color, texture, turgor normal. No rashes or lesions Neurologic: Grossly normal  Lab Results:  Recent Labs  08/24/17 2145  WBC 19.8*  HGB 8.8*  HCT 23.9*  PLT 537*   BMET  Recent Labs  08/24/17 2145 08/25/17 0800  NA 140 138  K 3.3* 3.9  CL 107 106  CO2 24 24  GLUCOSE 87 87  BUN 8 6  CREATININE 0.48 0.41*  CALCIUM 9.4 9.1    Studies/Results: No results found.  Medications: I have reviewed the patient's current  medications.  Assessment/Plan: A 26 year old female here with sickle cell painful crisis.  #1 sickle cell painful crisis: Patient will be maintain on current regimen. She additionally has MS Contin for chronic pain. Depending on her response with spectral to be discharged home with assistance from her insurance. Social worker is involved.  #2 anemia of chronic disease: H&H has been stable at baseline.  #3 borderline post noted disorder: Patient is to be improved and currently at baseline. No issues today.  #4 chronic pain syndrome: Again patient is on MS Contin with the PCA. We will try and get her home medications prior to discharge  LOS: 2 days   Haidar Muse,LAWAL 08/27/2017, 8:29 AM

## 2017-08-28 MED ORDER — OXYCODONE HCL 5 MG PO TABS
20.0000 mg | ORAL_TABLET | Freq: Four times a day (QID) | ORAL | Status: DC | PRN
  Administered 2017-08-29: 20 mg via ORAL
  Filled 2017-08-28: qty 4

## 2017-08-28 NOTE — Progress Notes (Signed)
Wasted 46ml of dilaudid PCA in sink with Jeri Lager, RN.

## 2017-08-28 NOTE — Progress Notes (Signed)
Pt refused am vitals, is refusing to wear EtCO2 monitor, and refused am toradol. She stated "I am sleeping and I do not have to do that." Will continue to monitor closely. Carmen [name] I

## 2017-08-28 NOTE — Progress Notes (Signed)
Subjective: Patient has no new complaints today. Her pain is down to 5 out of 10. She has used 30.5 mg with 79 demands and 77 deliveries of the Dilaudid through the PCA. She is eating and drinking without problem. No nausea vomiting or diarrhea.  Objective: Vital signs in last 24 hours: Temp:  [98.1 F (36.7 C)-98.5 F (36.9 C)] 98.2 F (36.8 C) (10/28 1358) Pulse Rate:  [71-91] 71 (10/28 1358) Resp:  [14-16] 16 (10/28 1358) BP: (121-140)/(42-88) 121/42 (10/28 1358) SpO2:  [95 %-100 %] 96 % (10/28 1358) Weight change:  Last BM Date: 08/27/17  Intake/Output from previous day: 10/27 0701 - 10/28 0700 In: 3360 [P.O.:960; I.V.:2400] Out: -  Intake/Output this shift: No intake/output data recorded.  General appearance: alert, cooperative, appears stated age and no distress Neck: no adenopathy, no carotid bruit, no JVD, supple, symmetrical, trachea midline and thyroid not enlarged, symmetric, no tenderness/mass/nodules Back: symmetric, no curvature. ROM normal. No CVA tenderness. Resp: clear to auscultation bilaterally Chest wall: no tenderness Cardio: regular rate and rhythm, S1, S2 normal, no murmur, click, rub or gallop GI: soft, non-tender; bowel sounds normal; no masses,  no organomegaly Extremities: extremities normal, atraumatic, no cyanosis or edema Pulses: 2+ and symmetric Skin: Skin color, texture, turgor normal. No rashes or lesions Neurologic: Grossly normal  Lab Results: No results for input(s): WBC, HGB, HCT, PLT in the last 72 hours. BMET  Recent Labs  08/27/17 1032  NA 138  K 4.4  CL 103  CO2 24  GLUCOSE 100*  BUN 6  CREATININE 0.48  CALCIUM 8.9    Studies/Results: No results found.  Medications: I have reviewed the patient's current medications.  Assessment/Plan: A 26 year old female here with sickle cell painful crisis.  #1 sickle cell painful crisis: Patient will continue on current Dilaudid PCA regimen and oral medications. I will start her  short acting oral medicines today improved patient for discharge tomorrow  #2 anemia of chronic disease: H&H has been stable at baseline.  #3 borderline post noted disorder: Patient is to be improved and currently at baseline. No issues today.  #4 chronic pain syndrome: Again patient is on MS Contin with the PCA. We will try and get her home medications prior to discharge   LOS: 3 days   Corri Delapaz,LAWAL 08/28/2017, 2:46 PM

## 2017-08-29 LAB — BASIC METABOLIC PANEL
ANION GAP: 11 (ref 5–15)
BUN: 5 mg/dL — AB (ref 6–20)
CHLORIDE: 102 mmol/L (ref 101–111)
CO2: 25 mmol/L (ref 22–32)
Calcium: 9.4 mg/dL (ref 8.9–10.3)
Creatinine, Ser: 0.43 mg/dL — ABNORMAL LOW (ref 0.44–1.00)
GFR calc Af Amer: >60 mL/min (ref >60)
GFR calc non Af Amer: >60 mL/min (ref >60)
GLUCOSE: 88 mg/dL (ref 65–99)
POTASSIUM: 3.7 mmol/L (ref 3.5–5.1)
Sodium: 138 mmol/L (ref 135–145)

## 2017-08-29 LAB — CBC WITH DIFFERENTIAL/PLATELET
BASOS ABS: 0.1 10*3/uL (ref 0.0–0.1)
BASOS PCT: 1 %
EOS PCT: 2 %
Eosinophils Absolute: 0.2 10*3/uL (ref 0.0–0.7)
HEMATOCRIT: 24.4 % — AB (ref 36.0–46.0)
Hemoglobin: 8.8 g/dL — ABNORMAL LOW (ref 12.0–15.0)
Lymphocytes Relative: 45 %
Lymphs Abs: 4.6 10*3/uL — ABNORMAL HIGH (ref 0.7–4.0)
MCH: 29.4 pg (ref 26.0–34.0)
MCHC: 36.1 g/dL — ABNORMAL HIGH (ref 30.0–36.0)
MCV: 81.6 fL (ref 78.0–100.0)
MONO ABS: 1.4 10*3/uL — AB (ref 0.1–1.0)
Monocytes Relative: 13 %
NEUTROS ABS: 4 10*3/uL (ref 1.7–7.7)
Neutrophils Relative %: 39 %
Platelets: 541 10*3/uL — ABNORMAL HIGH (ref 150–400)
RBC: 2.99 MIL/uL — AB (ref 3.87–5.11)
RDW: 18.1 % — ABNORMAL HIGH (ref 11.5–15.5)
WBC: 10.3 10*3/uL (ref 4.0–10.5)

## 2017-08-29 MED ORDER — HYDROMORPHONE 1 MG/ML IV SOLN
INTRAVENOUS | Status: DC
  Administered 2017-08-29: 9 mg via INTRAVENOUS
  Administered 2017-08-29: 18:00:00 via INTRAVENOUS
  Administered 2017-08-30: 3.5 mg via INTRAVENOUS
  Administered 2017-08-30: 4 mg via INTRAVENOUS
  Filled 2017-08-29 (×2): qty 25

## 2017-08-29 MED ORDER — OXYCODONE HCL 5 MG PO TABS
20.0000 mg | ORAL_TABLET | ORAL | Status: DC
  Administered 2017-08-29 – 2017-08-30 (×6): 20 mg via ORAL
  Filled 2017-08-29 (×7): qty 4

## 2017-08-29 NOTE — Progress Notes (Signed)
SICKLE CELL SERVICE PROGRESS NOTE  Carmen [name]:[id number] DOB: 1991-09-27 DOA: 08/24/2017 PCP: [name], MD  Assessment/Plan: Principal Problem:   Sickle cell pain crisis [name]) Active Problems:   Thrombocytosis (HCC)   Hypokalemia   Leukocytosis  1. Hb SS with crisis: Schedule Oxycodone every 4 hours and wean PCA to an interval of every 12 minutes with 1 hour lockout of 2.5 mg. Continue Toradol at current dose. Decrease IVF as patient eating and drinking well. .  2. Chronic Pain Syndrome: Continue  MS Contin 3. Anemia of Chronic Disease: Hb stable at baseline.  4. Borderline Personality Disorder: So far no issues.  Code Status: Full Code Family Communication: N/A Disposition Plan: Anticipate discharge tomorrow.   [name] A.  Pager [phone]. If 7PM-7AM, please contact night-coverage.  08/29/2017, 11:32 AM  LOS: 4 days   Interim History: Pt reports pain now only in legs at an intensity of 7-8/10. She has used 28 mg of Dilaudid with 56/56:demands/deliveries in the past 24 hours. She reports that she had a BM last night. Patient states that she is still working with her PMD to obtain her prescription for  medications for chronic pain management.  Consultants:  None  Procedures:  None  Antibiotics:  None   Objective: Vitals:   08/29/17 0338 08/29/17 0559 08/29/17 0800 08/29/17 1026  BP:  133/90  117/71  Pulse:  88  85  Resp: 13 15 16 15   Temp:  98.3 F (36.8 C)  98.1 F (36.7 C)  TempSrc:  Oral  Oral  SpO2: 95% 96% 97% 96%  Weight:      Height:       Weight change:   Intake/Output Summary (Last 24 hours) at 08/29/17 1132 Last data filed at 08/29/17 0600  Gross per 24 hour  Intake           3249.5 ml  Output                0 ml  Net           3249.5 ml     Physical Exam General: Alert, awake, oriented x3, in no acute distress. Very pleasant mood. HEENT: [place]/AT PEERL, EOMI, anicteric.  Heart: Regular rate and rhythm, without  murmurs, rubs, gallops, PMI non-displaced, no heaves or thrills on palpation.  Lungs: Clear to auscultation, no wheezing or rhonchi noted. No increased vocal fremitus resonant to percussion  Abdomen: Soft, nontender, nondistended, positive bowel sounds, no masses no hepatosplenomegaly noted..  Neuro: No focal neurological deficits noted cranial nerves II through XII grossly intact. Strength at baseline in bilateral upper and lower extremities. Musculoskeletal: No warmth swelling or erythema around joints, no spinal tenderness noted. Psychiatric: Patient alert and oriented x3, good insight and cognition, good recent to remote recall.     Data Reviewed: Basic Metabolic Panel:  Recent Labs Lab 08/24/17 2145 08/25/17 0800 08/27/17 1032  NA 140 138 138  K 3.3* 3.9 4.4  CL 107 106 103  CO2 24 24 24   GLUCOSE 87 87 100*  BUN 8 6 6   CREATININE 0.48 0.41* 0.48  CALCIUM 9.4 9.1 8.9   Liver Function Tests:  Recent Labs Lab 08/24/17 2145  AST 26  ALT 23  ALKPHOS 63  BILITOT 3.4*  PROT 7.9  ALBUMIN 4.1   No results for input(s): LIPASE, AMYLASE in the last 168 hours. No results for input(s): AMMONIA in the last 168 hours. CBC:  Recent Labs Lab 08/24/17 2145  WBC 19.8*  NEUTROABS  12.8*  HGB 8.8*  HCT 23.9*  MCV 81.0  PLT 537*   Cardiac Enzymes:  Recent Labs Lab 08/25/17 1353  TROPONINI <0.03   BNP (last 3 results) No results for input(s): BNP in the last 8760 hours.  ProBNP (last 3 results) No results for input(s): PROBNP in the last 8760 hours.  CBG: No results for input(s): GLUCAP in the last 168 hours.  No results found for this or any previous visit (from the past 240 hour(s)).   Studies: Dg Chest 2 View  Result Date: 08/24/2017 CLINICAL DATA:  26 year old female with chest pain. Concern for sickle cell crisis. EXAM: CHEST  2 VIEW COMPARISON:  Chest radiograph dated 07/16/2017 FINDINGS: The lungs are clear. Ill-defined density over the left lung base  likely superimposition of gastric content. There is no pleural effusion or pneumothorax. The cardiac silhouette is within normal limits. No acute osseous pathology identified. IMPRESSION: No active cardiopulmonary disease. Electronically Signed   By: Anner Crete M.D.   On: 08/24/2017 23:27    Scheduled Meds: . aspirin EC  81 mg Oral Daily  . enoxaparin (LOVENOX) injection  40 mg Subcutaneous QHS  . HYDROmorphone   Intravenous Q4H  . ketorolac  30 mg Intravenous Q6H  . morphine  60 mg Oral Q12H  . oxyCODONE  20 mg Oral Q4H  . senna-docusate  1 tablet Oral BID   Continuous Infusions: . dextrose 5 % and 0.45% NaCl 100 mL/hr at 08/29/17 0704  . diphenhydrAMINE (BENADRYL) IVPB(SICKLE CELL ONLY)      Principal Problem:   Sickle cell pain crisis ([place]) Active Problems:   Thrombocytosis (HCC)   Hypokalemia   Leukocytosis     In excess of 25 minutes spent during this visit. Greater than 50% involved face to face contact with the patient for assessment, counseling and coordination of care.

## 2017-08-29 NOTE — Progress Notes (Signed)
Noted patient may d/c tomorrow. TC to Dr. Noland Fordyce office [phone] spoke to [place]-received list of drug formulary,& aware of prior auth needed-she following with [facility] resource-Marguerite. No further CM needs.

## 2017-08-29 NOTE — Progress Notes (Signed)
Pt ambulated in halls. She walked the length of 4 east X2. Resting O2 sat and HR prior to ambulation are as follows; O2-97% on RA, HR 98. After ambulating O2-97% on RA,  HR 88. Pt tolerated well, respirations even and unlabored.

## 2017-08-30 ENCOUNTER — Encounter: Payer: Self-pay | Admitting: Internal Medicine

## 2017-08-30 DIAGNOSIS — D638 Anemia in other chronic diseases classified elsewhere: Secondary | ICD-10-CM

## 2017-08-30 DIAGNOSIS — D473 Essential (hemorrhagic) thrombocythemia: Secondary | ICD-10-CM

## 2017-08-30 NOTE — Discharge Summary (Signed)
Carmen [name] MRN: [id number] DOB/AGE: 04/19/1991 26 y.o.  Admit date: 08/24/2017 Discharge date: 08/30/2017  Primary Care Physician:  [name], MD  Post-Discharge Recommendations: 1. Start Hydrea at dose of 1000 mg daily and titrate every 6 weeks to MTD. 2. Start L-glutamine ([place]) 15 g BID 3. Refer to Hematologist (Sickle Cell Provider).  Discharge Diagnoses:   Patient Active Problem List   Diagnosis Date Noted  . 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 Anemia of chronic disease   . S/P laparoscopic cholecystectomy 11/30/2014  . Thrombocytosis ([place]) 11/22/2014  . Systolic ejection murmur 32/99/2426    DISCHARGE MEDICATION: Allergies as of 08/30/2017   No Active Allergies     Medication List    STOP taking these medications   ibuprofen 600 MG tablet Commonly known as:  ADVIL,MOTRIN     TAKE these medications   aspirin EC 81 MG tablet Take 1 tablet (81 mg total) by mouth daily.   cyclobenzaprine 10 MG tablet Commonly known as:  FLEXERIL Take 1 tablet (10 mg total) by mouth 2 (two) times daily as needed for muscle spasms.   morphine 60 MG 12 hr tablet Commonly known as:  MS CONTIN Take 1 tablet (60 mg total) by mouth every 12 (twelve) hours.   naproxen 500 MG tablet Commonly known as:  NAPROSYN Take 1 tablet (500 mg total) by mouth 2 (two) times daily.   Oxycodone HCl 10 MG Tabs Take 20 mg by mouth every 6 (six) hours as needed (PAIN).         Consults: Treatment Team:  Leana Gamer, MD   SIGNIFICANT DIAGNOSTIC STUDIES:  Dg Chest 2 View  Result Date: 08/24/2017 CLINICAL DATA:  26 year old female with chest pain. Concern for sickle cell crisis. EXAM: CHEST  2 VIEW COMPARISON:  Chest radiograph dated 07/16/2017 FINDINGS: The lungs are clear. Ill-defined density over the left lung base likely superimposition of gastric content. There is no pleural effusion or  pneumothorax. The cardiac silhouette is within normal limits. No acute osseous pathology identified. IMPRESSION: No active cardiopulmonary disease. Electronically Signed   By: Anner Crete M.D.   On: 08/24/2017 23:27       No results found for this or any previous visit (from the past 240 hour(s)).  BRIEF ADMITTING H & P: ALDEA AVIS is a 26 y.o. 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 Patient reports when she was just discharged from hospital yesterday morning that her pain was under adequate control, but around noon began generalized pain all over. Reports not having any of her pain medications at home because of a lapse in insurance. She has a sickle cell counselor, but they have not been able to get things sorted out yet. She complains of having some chest pain, but states the pain symptoms similar to previous sickle cell crises.Denies having fever, chills, nausea, vomiting, abdominal pain, diarrhea, shortness of breath, dysuria,and joint swelling.  Course: Upon admission into the emergency department patient is noted to be afebrile, pulse 74-104, respirations 14-29, and all other vital signs maintained. Labs revealed WBC 19.8, hemoglobin 8.8, platelets 537, and potassium 3.3. Chest x-ray showed no acute signs of infection.   Hospital Course:  Present on Admission: . (Resolved) Sickle cell pain crisis ([place]) . (Resolved) Hypokalemia . (Resolved) Leukocytosis . Thrombocytosis [facility])  Ms Schoch was  admitted with acute vaso-occlusive crisis triggered by withdrawal from not having her daily chronic prescribed opiates. Pt has been having issues with insurance coverage leading to difficulty obtaining her medications. The hospital course was essentially unremarkable with pain being managed with Dilaudid via PCA, Toradol and IVF. As the pain improved transition  was made to Oxycodone and PCA was weaned. At the time of discharge, pain was at 5/10, a level manageable with her oral analgesics. She is ambulatory without difficulty and without need for Oxygen supplementation. Pt however is very teary and frustrated with the issues surrounding her obtaining her medications. She is discharged home on pre-admission regimen of MS Contin 60 mg every 12 hours and Oxycodone 10 mg every 6 hours as needed for pan.  MS Contin for chronic pain was continued throughout hospitalization without interruption.    Pt initially had a mild leukocytosis which was secondary to inflammation associated with vaso-occlusive crisis. At the time of discharge the leukocytosis had resolved.    Pt also has a thrombocytosis which is reactive . She was continued on ASA 81 mg daily.   Pt has an anemia of chronic disease and Hb remained stable throughout hospitalization.   Disposition and Follow-up: Pt discharged home in stable condition. Case Manager advised of patients concerns about obtaining her medications.    DISCHARGE EXAM:  General: Alert, awake, oriented x 3, Well appearing and in no acute distress. HEENT: [place]/AT PEERL, EOMI, anicteric Neck: Trachea midline, no masses, no thyromegal,y no JVD, no carotid bruit OROPHARYNX: Moist, No exudate/ erythema/lesions.  Heart: Regular rate and rhythm, without murmurs, rubs, gallops or S3. PMI non-displaced. Exam reveals no decreased pulses. Pulmonary/Chest: Normal effort. Breath sounds normal. No. Apnea. Clear to auscultation,no stridor,  no wheezing and no rhonchi noted. No respiratory distress and no tenderness noted. Abdomen: Soft, nontender, nondistended, normal bowel sounds, no masses no hepatosplenomegaly noted. No fluid wave and no ascites. There is no guarding or rebound. Neuro: Alert and oriented to person, place and time. Normal motor skills, Displays no atrophy or tremors and exhibits normal muscle tone.  No focal neurological  deficits noted cranial nerves II through XII grossly intact. No sensory deficit noted. Strength at baseline in bilateral upper and lower extremities. Gait normal. Musculoskeletal: No warmth swelling or erythema around joints, no spinal tenderness noted. Psychiatric: Patient alert and oriented x3, good insight and cognition, good recent to remote recall. Mood is one of frustration. Skin: Skin is warm and dry. No bruising, no ecchymosis and no rash noted. Pt is not diaphoretic. No erythema. No pallor  Blood pressure 112/84, pulse 83, temperature 98.3 F (36.8 C), temperature source Oral, resp. rate 19, height 5\' 3"  (1.6 m), weight 76.4 kg (168 lb 6.9 oz), last menstrual period 08/05/2017, SpO2 98 %.   Recent Labs  08/29/17 1346  NA 138  K 3.7  CL 102  CO2 25  GLUCOSE 88  BUN 5*  CREATININE 0.43*  CALCIUM 9.4   No results for input(s): AST, ALT, ALKPHOS, BILITOT, PROT, ALBUMIN in the last 72 hours. No results for input(s): LIPASE, AMYLASE in the last 72 hours.  Recent Labs  08/29/17 1346  WBC 10.3  NEUTROABS 4.0  HGB 8.8*  HCT 24.4*  MCV 81.6  PLT 541*     Total time spent including face to face and decision making was greater than 30 minutes  Signed: Syndey Jaskolski A. 08/30/2017, 12:50 PM

## 2017-08-30 NOTE — Progress Notes (Signed)
Patient remains alert, oriented x4 ambulatory without assist. Discharge instructions reviewed, questions,concerns denied. Notes given.

## 2017-08-30 NOTE — Progress Notes (Signed)
TC again to pcp Dr McKenzie's office to confirm that patient would be able to get meds @ pharmacy-spoke to [facility] @ Dr's office-she confirmed that patient's meds are ready @ her pharmacy-[facility], made her aware of d/c today. No further CM needs.

## 2017-09-24 ENCOUNTER — Encounter (HOSPITAL_COMMUNITY): Payer: Self-pay

## 2017-09-24 ENCOUNTER — Emergency Department (HOSPITAL_COMMUNITY)
Admission: EM | Admit: 2017-09-24 | Discharge: 2017-09-24 | Disposition: A | Discharge status: Home / Self Care | Payer: Medicaid Other | Attending: Emergency Medicine | Admitting: Emergency Medicine

## 2017-09-24 DIAGNOSIS — D5700 Hb-SS disease with crisis, unspecified: Secondary | ICD-10-CM | POA: Insufficient documentation

## 2017-09-24 DIAGNOSIS — Z7982 Long term (current) use of aspirin: Secondary | ICD-10-CM | POA: Insufficient documentation

## 2017-09-24 DIAGNOSIS — Z79899 Other long term (current) drug therapy: Secondary | ICD-10-CM | POA: Diagnosis not present

## 2017-09-24 DIAGNOSIS — M546 Pain in thoracic spine: Secondary | ICD-10-CM | POA: Diagnosis present

## 2017-09-24 LAB — COMPREHENSIVE METABOLIC PANEL
ALT: 17 U/L (ref 14–54)
ANION GAP: 7 (ref 5–15)
AST: 22 U/L (ref 15–41)
Albumin: 3.9 g/dL (ref 3.5–5.0)
Alkaline Phosphatase: 64 U/L (ref 38–126)
BILIRUBIN TOTAL: 1.3 mg/dL — AB (ref 0.3–1.2)
BUN: 12 mg/dL (ref 6–20)
CALCIUM: 9.2 mg/dL (ref 8.9–10.3)
CO2: 22 mmol/L (ref 22–32)
CREATININE: 0.47 mg/dL (ref 0.44–1.00)
Chloride: 109 mmol/L (ref 101–111)
GFR calc Af Amer: >60 mL/min (ref >60)
GFR calc non Af Amer: >60 mL/min (ref >60)
GLUCOSE: 103 mg/dL — AB (ref 65–99)
Potassium: 3.8 mmol/L (ref 3.5–5.1)
Sodium: 138 mmol/L (ref 135–145)
TOTAL PROTEIN: 7.8 g/dL (ref 6.5–8.1)

## 2017-09-24 LAB — CBC WITH DIFFERENTIAL/PLATELET
BASOS ABS: 0 10*3/uL (ref 0.0–0.1)
BASOS PCT: 0 %
EOS ABS: 0.1 10*3/uL (ref 0.0–0.7)
EOS PCT: 1 %
HEMATOCRIT: 24.3 % — AB (ref 36.0–46.0)
Hemoglobin: 8.7 g/dL — ABNORMAL LOW (ref 12.0–15.0)
Lymphocytes Relative: 26 %
Lymphs Abs: 2.5 10*3/uL (ref 0.7–4.0)
MCH: 29.6 pg (ref 26.0–34.0)
MCHC: 35.8 g/dL (ref 30.0–36.0)
MCV: 82.7 fL (ref 78.0–100.0)
MONO ABS: 1.1 10*3/uL — AB (ref 0.1–1.0)
MONOS PCT: 12 %
NEUTROS ABS: 5.9 10*3/uL (ref 1.7–7.7)
Neutrophils Relative %: 61 %
PLATELETS: 596 10*3/uL — AB (ref 150–400)
RBC: 2.94 MIL/uL — ABNORMAL LOW (ref 3.87–5.11)
RDW: 15 % (ref 11.5–15.5)
WBC: 9.7 10*3/uL (ref 4.0–10.5)

## 2017-09-24 LAB — RETICULOCYTES
RBC.: 2.94 MIL/uL — AB (ref 3.87–5.11)
RETIC COUNT ABSOLUTE: 241.1 10*3/uL — AB (ref 19.0–186.0)
Retic Ct Pct: 8.2 % — ABNORMAL HIGH (ref 0.4–3.1)

## 2017-09-24 MED ORDER — HYDROMORPHONE HCL 2 MG/ML IJ SOLN: 2.0000 mg | INTRAMUSCULAR | Status: AC

## 2017-09-24 MED ORDER — HYDROMORPHONE HCL 2 MG/ML IJ SOLN
2.0000 mg | INTRAMUSCULAR | Status: AC
  Administered 2017-09-24: 2 mg via INTRAVENOUS
  Filled 2017-09-24: qty 1

## 2017-09-24 MED ORDER — KETOROLAC TROMETHAMINE 30 MG/ML IJ SOLN
30.0000 mg | INTRAMUSCULAR | Status: AC
  Administered 2017-09-24: 30 mg via INTRAVENOUS
  Filled 2017-09-24 (×2): qty 1

## 2017-09-24 MED ORDER — HYDROMORPHONE HCL 2 MG/ML IJ SOLN
2.0000 mg | INTRAMUSCULAR | Status: AC
  Filled 2017-09-24: qty 1

## 2017-09-24 MED ORDER — PROMETHAZINE HCL 25 MG/ML IJ SOLN
25.0000 mg | Freq: Once | INTRAMUSCULAR | Status: AC
  Administered 2017-09-24: 25 mg via INTRAVENOUS
  Filled 2017-09-24: qty 1

## 2017-09-24 NOTE — ED Triage Notes (Signed)
Pt presents with c/o sickle cell pain in her back. Pt reports the pain is all over her back and started several hours ago.

## 2017-09-24 NOTE — ED Provider Notes (Signed)
[facility] DEPT Provider Note   CSN: [id number] Arrival date & time: 09/24/17  0416     History   Chief Complaint Chief Complaint  Patient presents with  . Sickle Cell Pain Crisis    HPI Carmen [name] is a 26 y.o. female.   The history is provided by the patient.  She has a history of sickle cell disease and comes in with pain across her mid back which woke her up from sleep at about 4 AM.  She took Percocet and morphine without relief.  Pain is rated at 10/10.  She denies fever, chills, sweats.  She denies rhinorrhea, sore throat, cough.  She denies nausea, vomiting, diarrhea.  She denies urinary urgency, frequency, tenesmus, dysuria.  Past Medical History:  Diagnosis Date  . Abortion    05/2012  . Headache(784.0)   . Sickle cell crisis [facility])     Patient Active Problem List   Diagnosis Date Noted  . 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 Anemia of chronic disease   . S/P laparoscopic cholecystectomy 11/30/2014  . Thrombocytosis ([place]) 11/22/2014  . Systolic ejection murmur 83/38/2505    Past Surgical History:  Procedure Laterality Date  . CHOLECYSTECTOMY N/A 11/30/2014   Procedure: LAPAROSCOPIC CHOLECYSTECTOMY SINGLE SITE WITH INTRAOPERATIVE CHOLANGIOGRAM;  Surgeon: Michael Boston, MD;  Location: [facility] ORS;  Service: General;  Laterality: N/A;  . SPLENECTOMY      OB History    Gravida Para Term Preterm AB Living   1       1     SAB TAB Ectopic Multiple Live Births                   Home Medications    Prior to Admission medications   Medication Sig Start Date End Date Taking? Authorizing Provider  aspirin EC 81 MG tablet Take 1 tablet (81 mg total) by mouth daily. 05/31/17  Yes Leana Gamer, MD  cyclobenzaprine (FLEXERIL) 10 MG tablet Take 1 tablet (10 mg total) by mouth 2 (two) times daily as needed for muscle spasms. 08/11/17  Yes Glyn Ade,  PA-C  morphine (MS CONTIN) 60 MG 12 hr tablet Take 1 tablet (60 mg total) by mouth every 12 (twelve) hours. 05/31/17  Yes Leana Gamer, MD  naproxen (NAPROSYN) 500 MG tablet Take 1 tablet (500 mg total) by mouth 2 (two) times daily. Patient taking differently: Take 500 mg by mouth 2 (two) times daily as needed for mild pain or moderate pain.  08/11/17  Yes Glyn Ade, PA-C  Oxycodone HCl 10 MG TABS Take 20 mg by mouth every 6 (six) hours as needed (PAIN).   Yes [provider]    Family History Family History  Problem Relation Age of Onset  . Hypertension Mother   . Sickle cell anemia Sister   . Kidney disease Sister        Lupus  . Arthritis Sister   . Sickle cell anemia Sister   . Sickle cell trait Sister   . Heart disease Maternal Aunt        CABG  . Heart disease Maternal Uncle        CABG  . Lupus Sister     Social History Social History   Tobacco Use  . Smoking status: Never Smoker  . Smokeless tobacco: Never Used  Substance Use Topics  . Alcohol use: No  .  Drug use: No     Allergies   Patient has no known allergies.   Review of Systems Review of Systems  All other systems reviewed and are negative.    Physical Exam Updated Vital Signs BP 123/75   Pulse 95   Temp 98.2 F (36.8 C)   Resp 16   Ht 5\' 3"  (1.6 m)   Wt 77.1 kg (170 lb)   LMP 09/09/2017 (Approximate)   SpO2 99%   BMI 30.11 kg/m    Physical Exam  Nursing note and vitals reviewed.  26 year old female, resting comfortably and in no acute distress. Vital signs are normal. Oxygen saturation is 99%, which is normal. Head is normocephalic and atraumatic. PERRLA, EOMI. Oropharynx is clear. Neck is nontender and supple without adenopathy or JVD. Back is nontender and there is no CVA tenderness. Lungs are clear without rales, wheezes, or rhonchi. Chest is nontender. Heart has regular rate and rhythm without murmur. Abdomen is soft, flat, nontender without masses or  hepatosplenomegaly and peristalsis is normoactive. Extremities have no cyanosis or edema, full range of motion is present. Skin is warm and dry without rash. Neurologic: Mental status is normal, cranial nerves are intact, there are no motor or sensory deficits.  ED Treatments / Results  Labs (all labs ordered are listed, but only abnormal results are displayed) Labs Reviewed  COMPREHENSIVE METABOLIC PANEL - Abnormal; Notable for the following components:      Result Value   Glucose, Bld 103 (*)    Total Bilirubin 1.3 (*)    All other components within normal limits  CBC WITH DIFFERENTIAL/PLATELET - Abnormal; Notable for the following components:   RBC 2.94 (*)    Hemoglobin 8.7 (*)    HCT 24.3 (*)    Platelets 596 (*)    Monocytes Absolute 1.1 (*)    All other components within normal limits  RETICULOCYTES - Abnormal; Notable for the following components:   Retic Ct Pct 8.2 (*)    RBC. 2.94 (*)    Retic Count, Absolute 241.1 (*)    All other components within normal limits  URINALYSIS, ROUTINE W REFLEX MICROSCOPIC  POC URINE PREG, ED    Procedures Procedures (including critical care time)  Medications Ordered in ED Medications  HYDROmorphone (DILAUDID) injection 2 mg (not administered)    Or  HYDROmorphone (DILAUDID) injection 2 mg (not administered)  HYDROmorphone (DILAUDID) injection 2 mg (not administered)    Or  HYDROmorphone (DILAUDID) injection 2 mg (not administered)  HYDROmorphone (DILAUDID) injection 2 mg (not administered)    Or  HYDROmorphone (DILAUDID) injection 2 mg (not administered)  ketorolac (TORADOL) 30 MG/ML injection 30 mg (not administered)  HYDROmorphone (DILAUDID) injection 2 mg (2 mg Intravenous Given 09/24/17 0735)    Or  HYDROmorphone (DILAUDID) injection 2 mg ( Subcutaneous See Alternative 09/24/17 0735)  promethazine (PHENERGAN) injection 25 mg (25 mg Intravenous Given 09/24/17 0736)     Initial Impression / Assessment and Plan / ED  Course  I have reviewed the triage vital signs and the nursing notes.  Pertinent lab results that were available during my care of the patient were reviewed by me and considered in my medical decision making (see chart for details).  Sickle cell vaso-occlusive crisis with back pain.No obvious precipitating event.  She will be started on sickle cell pain protocol.  Old records are reviewed, and she has multiple ED visits for sickle cell pain and frequent hospitalizations for sickle cell pain.  There is delay  in getting IV started because of need for IV team to see the patient to establish IV access.  At this point, she has just received her initial dose of hydromorphone.  Case is signed out to Dr. Dallas Breeding.  Final Clinical Impressions(s) / ED Diagnoses   Final diagnoses:  Sickle cell pain crisis [facility])    ED Discharge Orders    None      Delora Fuel, MD 50/01/64 [phone]

## 2017-09-24 NOTE — ED Notes (Signed)
Pt denies any SOB or chest pain.

## 2017-09-24 NOTE — ED Notes (Signed)
IV team unsuccessful with IV stick

## 2017-09-24 NOTE — ED Provider Notes (Signed)
  Physical Exam  BP 116/71   Pulse 77   Temp 98.6 F (37 C) (Oral)   Resp 18   Ht 5\' 3"  (1.6 m)   Wt 77.1 kg (170 lb)   LMP 09/09/2017 (Approximate)   SpO2 99%   BMI 30.11 kg/m   Physical Exam  Constitutional: She is oriented to person, place, and time. She appears well-developed and well-nourished. No distress.  HENT:  Head: Normocephalic and atraumatic.  Right Ear: External ear normal.  Left Ear: External ear normal.  Nose: Nose normal.  Eyes: Conjunctivae and EOM are normal. No scleral icterus.  Neck: Normal range of motion and phonation normal.  Cardiovascular: Normal rate and regular rhythm.  Pulmonary/Chest: Effort normal. No stridor. No respiratory distress.  Abdominal: She exhibits no distension.  Musculoskeletal: Normal range of motion. She exhibits no edema.  Neurological: She is alert and oriented to person, place, and time.  Skin: She is not diaphoretic.  Psychiatric: She has a normal mood and affect. Her behavior is normal.  Vitals reviewed.   ED Course  Procedures  MDM I assumed care of this patient from Dr. Roxanne Mins at 1740.  Please see their note for further details of Hx, PE.  Briefly patient is a 26 y.o. female with a history of sickle cell disease who presented with lower back pain consistent with her sickle cell crisis.  At time of signout, patient had just received her first dose of narcotic medication.  Plan is to reevaluate after 3 doses.    Patient with improved pain, requesting discharge.  The patient appears reasonably screened and/or stabilized for discharge and I doubt any other medical condition or other [facility] requiring further screening, evaluation, or treatment in the ED at this time prior to discharge.  The patient is safe for discharge with strict return precautions.  Disposition: Discharge  Condition: Good  I have discussed the results, Dx and Tx plan with the patient who expressed understanding and agree(s) with the plan. Discharge  instructions discussed at great length. The patient was given strict return precautions who verbalized understanding of the instructions. No further questions at time of discharge.    ED Discharge Orders    None       Follow Up: [name], MD [place] [id number] [phone]  Schedule an appointment as soon as possible for a visit  As needed           Fatima Blank, MD 09/24/17 [phone]

## 2017-09-24 NOTE — ED Notes (Signed)
PT have been made aware of urine sample 

## 2017-09-24 NOTE — ED Notes (Signed)
Bed: [facility] Expected date:  Expected time:  Means of arrival:  Comments: 

## 2017-11-18 ENCOUNTER — Other Ambulatory Visit: Payer: Self-pay

## 2017-11-18 ENCOUNTER — Encounter (HOSPITAL_COMMUNITY): Payer: Self-pay | Admitting: Emergency Medicine

## 2017-11-18 ENCOUNTER — Emergency Department (HOSPITAL_COMMUNITY)
Admission: EM | Admit: 2017-11-18 | Discharge: 2017-11-18 | Disposition: A | Discharge status: Home / Self Care | Payer: Medicaid Other | Attending: Emergency Medicine | Admitting: Emergency Medicine

## 2017-11-18 DIAGNOSIS — Z7982 Long term (current) use of aspirin: Secondary | ICD-10-CM | POA: Insufficient documentation

## 2017-11-18 DIAGNOSIS — M549 Dorsalgia, unspecified: Secondary | ICD-10-CM | POA: Insufficient documentation

## 2017-11-18 DIAGNOSIS — M79602 Pain in left arm: Secondary | ICD-10-CM | POA: Diagnosis not present

## 2017-11-18 DIAGNOSIS — D5700 Hb-SS disease with crisis, unspecified: Secondary | ICD-10-CM | POA: Diagnosis not present

## 2017-11-18 DIAGNOSIS — M79601 Pain in right arm: Secondary | ICD-10-CM | POA: Diagnosis not present

## 2017-11-18 LAB — CBC WITH DIFFERENTIAL/PLATELET
BASOS ABS: 0 10*3/uL (ref 0.0–0.1)
BASOS PCT: 0 %
EOS ABS: 0.1 10*3/uL (ref 0.0–0.7)
Eosinophils Relative: 1 %
HCT: 28.9 % — ABNORMAL LOW (ref 36.0–46.0)
Hemoglobin: 10.4 g/dL — ABNORMAL LOW (ref 12.0–15.0)
Lymphocytes Relative: 36 %
Lymphs Abs: 4 10*3/uL (ref 0.7–4.0)
MCH: 29.3 pg (ref 26.0–34.0)
MCHC: 36 g/dL (ref 30.0–36.0)
MCV: 81.4 fL (ref 78.0–100.0)
MONO ABS: 1.3 10*3/uL — AB (ref 0.1–1.0)
Monocytes Relative: 11 %
NEUTROS PCT: 52 %
Neutro Abs: 5.8 10*3/uL (ref 1.7–7.7)
Platelets: 630 10*3/uL — ABNORMAL HIGH (ref 150–400)
RBC: 3.55 MIL/uL — ABNORMAL LOW (ref 3.87–5.11)
RDW: 14.6 % (ref 11.5–15.5)
WBC: 11.3 10*3/uL — ABNORMAL HIGH (ref 4.0–10.5)

## 2017-11-18 LAB — RETICULOCYTES
RBC.: 3.55 MIL/uL — AB (ref 3.87–5.11)
RETIC COUNT ABSOLUTE: 170.4 10*3/uL (ref 19.0–186.0)
RETIC CT PCT: 4.8 % — AB (ref 0.4–3.1)

## 2017-11-18 LAB — BASIC METABOLIC PANEL
ANION GAP: 6 (ref 5–15)
BUN: 9 mg/dL (ref 6–20)
CALCIUM: 9.3 mg/dL (ref 8.9–10.3)
CO2: 25 mmol/L (ref 22–32)
CREATININE: 0.46 mg/dL (ref 0.44–1.00)
Chloride: 106 mmol/L (ref 101–111)
GLUCOSE: 97 mg/dL (ref 65–99)
Potassium: 3.8 mmol/L (ref 3.5–5.1)
Sodium: 137 mmol/L (ref 135–145)

## 2017-11-18 MED ORDER — SODIUM CHLORIDE 0.9 % IV BOLUS (SEPSIS)
1000.0000 mL | Freq: Once | INTRAVENOUS | Status: AC
  Administered 2017-11-18: 1000 mL via INTRAVENOUS

## 2017-11-18 MED ORDER — HYDROMORPHONE HCL 2 MG/ML IJ SOLN
2.0000 mg | Freq: Once | INTRAMUSCULAR | Status: AC
  Administered 2017-11-18: 2 mg via INTRAVENOUS
  Filled 2017-11-18: qty 1

## 2017-11-18 MED ORDER — HYDROMORPHONE HCL 2 MG/ML IJ SOLN
2.0000 mg | Freq: Once | INTRAMUSCULAR | Status: AC
Start: 2017-11-18 — End: 2017-11-18
  Administered 2017-11-18: 2 mg via INTRAVENOUS
  Filled 2017-11-18: qty 1

## 2017-11-18 NOTE — Discharge Instructions (Signed)
Continue your medications as before.  Follow-up with your primary doctor as needed.

## 2017-11-18 NOTE — ED Provider Notes (Signed)
[facility] DEPT Provider Note   CSN: [id number] Arrival date & time: 11/18/17  0124     History   Chief Complaint Chief Complaint  Patient presents with  . Sickle Cell Pain Crisis    HPI Carmen [name] is a 27 y.o. female.  Patient is a 27 year old female with past medical history of sickle cell disease.  She presents today for evaluation of pain in her mid back and arms that she believes is due to a sickle cell crisis.  She believes that the cold exacerbates these episodes.  She denies any chest pain or difficulty breathing.  She denies any fevers or chills.  She has tried her home medications with little relief.   The history is provided by the patient.  Sickle Cell Pain Crisis  Location:  Back and upper extremity Severity:  Severe Onset quality:  Sudden Duration:  2 days Timing:  Constant Progression:  Worsening Chronicity:  Recurrent Context: cold exposure   Relieved by:  Nothing Worsened by:  Nothing Ineffective treatments:  Prescription drugs   Past Medical History:  Diagnosis Date  . Abortion    05/2012  . Headache(784.0)   . Sickle cell crisis [facility])     Patient Active Problem List   Diagnosis Date Noted  . 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 Anemia of chronic disease   . S/P laparoscopic cholecystectomy 11/30/2014  . Thrombocytosis ([place]) 11/22/2014  . Systolic ejection murmur 40/98/1191    Past Surgical History:  Procedure Laterality Date  . CHOLECYSTECTOMY N/A 11/30/2014   Procedure: LAPAROSCOPIC CHOLECYSTECTOMY SINGLE SITE WITH INTRAOPERATIVE CHOLANGIOGRAM;  Surgeon: Michael Boston, MD;  Location: [facility] ORS;  Service: General;  Laterality: N/A;  . SPLENECTOMY      OB History    Gravida Para Term Preterm AB Living   1       1     SAB TAB Ectopic Multiple Live Births                   Home Medications    Prior to Admission medications     Medication Sig Start Date End Date Taking? Authorizing Provider  aspirin EC 81 MG tablet Take 1 tablet (81 mg total) by mouth daily. 05/31/17   Leana Gamer, MD  cyclobenzaprine (FLEXERIL) 10 MG tablet Take 1 tablet (10 mg total) by mouth 2 (two) times daily as needed for muscle spasms. 08/11/17   Glyn Ade, PA-C  morphine (MS CONTIN) 60 MG 12 hr tablet Take 1 tablet (60 mg total) by mouth every 12 (twelve) hours. 05/31/17   Leana Gamer, MD  naproxen (NAPROSYN) 500 MG tablet Take 1 tablet (500 mg total) by mouth 2 (two) times daily. Patient taking differently: Take 500 mg by mouth 2 (two) times daily as needed for mild pain or moderate pain.  08/11/17   Glyn Ade, PA-C  Oxycodone HCl 10 MG TABS Take 20 mg by mouth every 6 (six) hours as needed (PAIN).    [provider]    Family History Family History  Problem Relation Age of Onset  . Hypertension Mother   . Sickle cell anemia Sister   . Kidney disease Sister        Lupus  . Arthritis Sister   . Sickle cell anemia Sister   . Sickle cell trait Sister   . Heart disease Maternal Aunt  CABG  . Heart disease Maternal Uncle        CABG  . Lupus Sister     Social History Social History   Tobacco Use  . Smoking status: Never Smoker  . Smokeless tobacco: Never Used  Substance Use Topics  . Alcohol use: No  . Drug use: No     Allergies   Patient has no known allergies.   Review of Systems Review of Systems  All other systems reviewed and are negative.    Physical Exam Updated Vital Signs BP 125/83 (BP Location: Right Arm)   Pulse (!) 104   Temp 97.9 F (36.6 C) (Oral)   Resp 18   Ht 5\' 4"  (1.626 m)   Wt 79.4 kg (175 lb)   LMP 11/09/2017 (Exact Date)   SpO2 100%   BMI 30.04 kg/m   Physical Exam  Constitutional: She is oriented to person, place, and time. She appears well-developed and well-nourished. No distress.  HENT:  Head: Normocephalic and atraumatic.   Neck: Normal range of motion. Neck supple.  Cardiovascular: Normal rate and regular rhythm. Exam reveals no gallop and no friction rub.  No murmur heard. Pulmonary/Chest: Effort normal and breath sounds normal. No respiratory distress. She has no wheezes.  Abdominal: Soft. Bowel sounds are normal. She exhibits no distension. There is no tenderness.  Musculoskeletal: Normal range of motion.  Neurological: She is alert and oriented to person, place, and time.  Skin: Skin is warm and dry. She is not diaphoretic.  Nursing note and vitals reviewed.    ED Treatments / Results  Labs (all labs ordered are listed, but only abnormal results are displayed) Labs Reviewed  BASIC METABOLIC PANEL  CBC WITH DIFFERENTIAL/PLATELET  RETICULOCYTES    EKG  EKG Interpretation None       Radiology No results found.  Procedures Procedures (including critical care time)  Medications Ordered in ED Medications  sodium chloride 0.9 % bolus 1,000 mL (not administered)  HYDROmorphone (DILAUDID) injection 2 mg (not administered)     Initial Impression / Assessment and Plan / ED Course  I have reviewed the triage vital signs and the nursing notes.  Pertinent labs & imaging results that were available during my care of the patient were reviewed by me and considered in my medical decision making (see chart for details).  Patient presents with complaints of pain in her back and arms consistent with prior sickle cell crises.  Her workup today reveals laboratory studies consistent with her baseline.  She appears in no distress.  She is afebrile.  She was given her usual medications and is now feeling better.  At this point I feel as though she is appropriate for discharge and she is comfortable with this.  She is to follow-up in the sickle cell clinic as needed.  Final Clinical Impressions(s) / ED Diagnoses   Final diagnoses:  None    ED Discharge Orders    None       Veryl Speak,  MD 11/18/17 [phone]

## 2017-11-18 NOTE — ED Triage Notes (Signed)
Pt is c/o sickle cell pain in her back and arms x 2 days

## 2018-01-05 ENCOUNTER — Ambulatory Visit: Payer: Medicaid Other | Admitting: Family Medicine

## 2018-01-09 ENCOUNTER — Other Ambulatory Visit: Payer: Self-pay

## 2018-01-09 ENCOUNTER — Ambulatory Visit: Payer: Medicaid Other | Admitting: Family Medicine

## 2018-01-12 ENCOUNTER — Ambulatory Visit (INDEPENDENT_AMBULATORY_CARE_PROVIDER_SITE_OTHER): Payer: Medicaid Other | Admitting: Family Medicine

## 2018-01-12 ENCOUNTER — Encounter: Payer: Self-pay | Admitting: Family Medicine

## 2018-01-12 VITALS — BP 115/58 | HR 86 | Temp 98.0°F | Resp 16 | Ht 63.0 in | Wt 173.0 lb

## 2018-01-12 DIAGNOSIS — D571 Sickle-cell disease without crisis: Secondary | ICD-10-CM

## 2018-01-12 DIAGNOSIS — Z79891 Long term (current) use of opiate analgesic: Secondary | ICD-10-CM

## 2018-01-12 DIAGNOSIS — F17200 Nicotine dependence, unspecified, uncomplicated: Secondary | ICD-10-CM

## 2018-01-12 LAB — POCT URINALYSIS DIP (DEVICE)
Bilirubin Urine: NEGATIVE
Glucose, UA: NEGATIVE mg/dL
HGB URINE DIPSTICK: NEGATIVE
KETONES UR: NEGATIVE mg/dL
Leukocytes, UA: NEGATIVE
Nitrite: NEGATIVE
PH: 6.5 (ref 5.0–8.0)
Protein, ur: NEGATIVE mg/dL
SPECIFIC GRAVITY, URINE: 1.015 (ref 1.005–1.030)
Urobilinogen, UA: 1 mg/dL (ref 0.0–1.0)

## 2018-01-12 NOTE — Patient Instructions (Addendum)
  Patient and I discussed pain medication regimen at length. She expressed understanding.  Patient's last prescription was filled on 12/19/2017 Oxycodone 10 mg (# 240 tablets) and MS Contin (60 tablets). Patient warrants a referral to pain management.   Will start folic acid 1 mg daily Increase water intake to 64 ounces per day.    Sickle Cell Anemia, Adult Sickle cell anemia is a condition where your red blood cells are shaped like sickles. Red blood cells carry oxygen through the body. Sickle-shaped red blood cells do not live as long as normal red blood cells. They also clump together and block blood from flowing through the blood vessels. These things prevent the body from getting enough oxygen. Sickle cell anemia causes organ damage and pain. It also increases the risk of infection. Follow these instructions at home:  Drink enough fluid to keep your pee (urine) clear or pale yellow. Drink more in hot weather and during exercise.  Do not smoke. Smoking lowers oxygen levels in the blood.  Only take over-the-counter or prescription medicines as told by your doctor.  Take antibiotic medicines as told by your doctor. Make sure you finish them even if you start to feel better.  Take supplements as told by your doctor.  Consider wearing a medical alert bracelet. This tells anyone caring for you in an emergency of your condition.  When traveling, keep your medical information, doctors' names, and the medicines you take with you at all times.  If you have a fever, do not take fever medicines right away. This could cover up a problem. Tell your doctor.  Keep all follow-up visits with your doctor. Sickle cell anemia requires regular medical care. Contact a doctor if: You have a fever. Get help right away if:  You feel dizzy or faint.  You have new belly (abdominal) pain, especially on the left side near the stomach area.  You have a lasting, often uncomfortable and painful erection of  the penis (priapism). If it is not treated right away, you will become unable to have sex (impotence).  You have numbness in your arms or legs or you have a hard time moving them.  You have a hard time talking.  You have a fever or lasting symptoms for more than 2-3 days.  You have a fever and your symptoms suddenly get worse.  You have signs or symptoms of infection. These include: ? Chills. ? Being more tired than normal (lethargy). ? Irritability. ? Poor eating. ? Throwing up (vomiting).  You have pain that is not helped with medicine.  You have shortness of breath.  You have pain in your chest.  You are coughing up pus-like or bloody mucus.  You have a stiff neck.  Your feet or hands swell or have pain.  Your belly looks bloated.  Your joints hurt. This information is not intended to replace advice given to you by your health care provider. Make sure you discuss any questions you have with your health care provider. Document Released: 08/08/2013 Document Revised: 03/25/2016 Document Reviewed: 05/30/2013 Elsevier Interactive Patient Education  2017 [employer].

## 2018-01-12 NOTE — Progress Notes (Signed)
Subjective:    Patient ID: Carmen [name], female    DOB: November 12, 1990, 27 y.o.   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 Carmen [name] was a patient of Carmen [name] prior to establishing care. Patient is complaining about chronic pain.  She typically takes MS Contin 60 mg every 12 hours and oxycodone 20 mg every 4 hours with moderate relief.  Patient is not being followed by hematology.  She states that she establish care with [facility] Hematology, but has been lost to follow up.  She says that she eats a balanced diet and hydrate consistently.  Patient is not on folic acid or hydroxyurea.  Current pain intensity is 3-4/10 characterized as intermittent and throbbing.  Patient says that she is up-to-date with vaccinations.  She has not had a Pap smear in greater than 3 years and does not perform monthly self breast exams.  She says that she is sexually active with barrier protection.  Past Medical History:  Diagnosis Date  . Abortion    05/2012  . Headache(784.0)   . Sickle cell crisis [facility])    Social History   Socioeconomic History  . Marital status: Single    Spouse name: Not on file  . Number of children: Not on file  . Years of education: Not on file  . Highest education level: Not on file  Social Needs  . Financial resource strain: Not on file  . Food insecurity - worry: Not on file  . Food insecurity - inability: Not on file  . Transportation needs - medical: Not on file  . Transportation needs - non-medical: Not on file  Occupational History  . Not on file  Tobacco Use  . Smoking status: Never Smoker  . Smokeless tobacco: Never Used  Substance and Sexual Activity  . Alcohol use: No  . Drug use: No  . Sexual activity: Yes    Birth control/protection: None  Other Topics Concern  . Not on file  Social History Narrative  . Not on file   Immunization History  Administered Date(s) Administered  .  Influenza,inj,Quad PF,6+ Mos 11/13/2013, 09/10/2015, 08/17/2016, 07/18/2017  . Pneumococcal Polysaccharide-23 11/13/2013  . Tdap 11/11/2011    Review of Systems  Constitutional: Negative.   HENT: Negative.   Eyes: Negative.   Respiratory: Negative.   Cardiovascular: Negative.   Gastrointestinal: Negative.   Endocrine: Negative for polydipsia, polyphagia and polyuria.  Genitourinary: Negative.   Musculoskeletal: Positive for arthralgias and back pain.  Skin: Negative.   Neurological: Negative.   Hematological: Negative.   Psychiatric/Behavioral: Negative.       Objective:   Physical Exam  Constitutional: She is oriented to person, place, and time. She appears well-developed and well-nourished.  HENT:  Head: Normocephalic and atraumatic.  Right Ear: External ear normal.  Left Ear: External ear normal.  Nose: Nose normal.  Mouth/Throat: Oropharynx is clear and moist.  Cardiovascular: Normal rate, regular rhythm, normal heart sounds and intact distal pulses.  Pulmonary/Chest: Effort normal and breath sounds normal.  Abdominal: Soft. Bowel sounds are normal.  Neurological: She is alert and oriented to person, place, and time. She has normal reflexes.  Skin: Skin is warm and dry.  Psychiatric: She has a normal mood and affect. Her behavior is normal. Judgment and thought content normal.      BP (!) 115/58 (BP Location: Left Arm, Patient Position: Sitting, Cuff Size: Large)  Pulse 86   Temp 98 F (36.7 C) (Oral)   Resp 16   Ht 5\' 3"  (1.6 m)   Wt 173 lb (78.5 kg)   LMP 01/02/2018   SpO2 100%   BMI 30.65 kg/m  Assessment & Plan:  1. Hb-SS disease without crisis [facility])   Carmen [name] and I discussed pain medication regimen at length. Patient is currently on 240 MME/Day, which will not be prescribed in primary care. I will send a referral to pain management.  Patient is not interested in starting hydrea at this time. I recommend that she establishes care with hematology.    Will start folic acid 1 mg daily to prevent aplastic bone marrow crises.   Pulmonary evaluation - Patient denies severe recurrent wheezes, shortness of breath with exercise, or persistent cough. If these symptoms develop, pulmonary function tests with spirometry will be ordered, and if abnormal, plan on referral to Pulmonology for further evaluation.  Cardiac - Routine screening for pulmonary hypertension is not recommended.  Eye - High risk of proliferative retinopathy. Annual eye exam with retinal exam recommended to patient. Last eye examination 6 months ago  Immunization status - Patient will receive influenza vaccination on today  Acute and chronic painful episodes - Will send a referral to pain management. I reviewed substance reporting system MME is 240 per day. Patient last received Oxycodone 20 mg every 4 hours for moderate to severe pain #240 and MS contin 60 mg BID # 60. Informed patient that we will have to reduce medications by 10% when due in order to prescribe in primary care. Patient expressed understanding. I will review tox screen as it becomes available. . Pt is also aware that the prescription history is available to [place] online through the [place]. Controlled substance agreement signed 01/12/2018. We reminded Carmen [name] that all patients receiving Schedule II narcotics must be seen for follow within one month of prescription being requested. We reviewed the terms of our pain agreement, including the need to keep medicines in a safe locked location away from children or pets, and the need to report excess sedation or constipation, measures to avoid constipation, and policies related to early refills and stolen prescriptions. According to the [place] Chronic Pain Initiative program, we have reviewed details related to analgesia, adverse effects, aberrant behaviors. Reviewed [place] Substance Reporting system prior to prescribing opiate medication, no inconsistencies noted.    - Iron overload  from chronic transfusion. Will check ferritin levels. She has received blood transfusions in the past.   - Vitamin D, 25-hydroxy - CBC with Differential - Comprehensive metabolic panel - EKG [id number] - Ferritin - Reticulocytes - POCT urinalysis dip (device)  2. Chronic prescription opiate use - ToxASSURE Select 13 (MW), Urine  3. Tobacco dependence Smoking cessation instruction/counseling given:  counseled patient on the dangers of tobacco use, advised patient to stop smoking, and reviewed strategies to maximize success    RTC: 1 month for sickle cell anemia and routine gynecological exam.   [name]  MSN, FNP-C [facility] [address] [place], [place] [address] [phone]

## 2018-01-13 ENCOUNTER — Other Ambulatory Visit: Payer: Self-pay | Admitting: Family Medicine

## 2018-01-13 ENCOUNTER — Telehealth: Payer: Self-pay

## 2018-01-13 DIAGNOSIS — E559 Vitamin D deficiency, unspecified: Secondary | ICD-10-CM

## 2018-01-13 LAB — COMPREHENSIVE METABOLIC PANEL
ALBUMIN: 4.3 g/dL (ref 3.5–5.5)
ALT: 12 IU/L (ref 0–32)
AST: 19 IU/L (ref 0–40)
Albumin/Globulin Ratio: 1.3 (ref 1.2–2.2)
Alkaline Phosphatase: 69 IU/L (ref 39–117)
BUN / CREAT RATIO: 14 (ref 9–23)
BUN: 9 mg/dL (ref 6–20)
Bilirubin Total: 1 mg/dL (ref 0.0–1.2)
CALCIUM: 9.5 mg/dL (ref 8.7–10.2)
CO2: 19 mmol/L — ABNORMAL LOW (ref 20–29)
Chloride: 109 mmol/L — ABNORMAL HIGH (ref 96–106)
Creatinine, Ser: 0.63 mg/dL (ref 0.57–1.00)
GFR, EST AFRICAN AMERICAN: 145 mL/min/{1.73_m2} (ref >59)
GFR, EST NON AFRICAN AMERICAN: 126 mL/min/{1.73_m2} (ref >59)
Globulin, Total: 3.4 g/dL (ref 1.5–4.5)
Glucose: 90 mg/dL (ref 65–99)
POTASSIUM: 4.5 mmol/L (ref 3.5–5.2)
Sodium: 143 mmol/L (ref 134–144)
TOTAL PROTEIN: 7.7 g/dL (ref 6.0–8.5)

## 2018-01-13 LAB — CBC WITH DIFFERENTIAL/PLATELET
BASOS: 0 %
Basophils Absolute: 0 10*3/uL (ref 0.0–0.2)
EOS (ABSOLUTE): 0.1 10*3/uL (ref 0.0–0.4)
EOS: 2 %
HEMATOCRIT: 29 % — AB (ref 34.0–46.6)
HEMOGLOBIN: 9.5 g/dL — AB (ref 11.1–15.9)
IMMATURE GRANS (ABS): 0 10*3/uL (ref 0.0–0.1)
IMMATURE GRANULOCYTES: 0 %
Lymphocytes Absolute: 3.8 10*3/uL — ABNORMAL HIGH (ref 0.7–3.1)
Lymphs: 47 %
MCH: 28.2 pg (ref 26.6–33.0)
MCHC: 32.8 g/dL (ref 31.5–35.7)
MCV: 86 fL (ref 79–97)
MONOCYTES: 12 %
Monocytes Absolute: 1 10*3/uL — ABNORMAL HIGH (ref 0.1–0.9)
NEUTROS ABS: 3.1 10*3/uL (ref 1.4–7.0)
NEUTROS PCT: 39 %
PLATELETS: 642 10*3/uL — AB (ref 150–379)
RBC: 3.37 x10E6/uL — ABNORMAL LOW (ref 3.77–5.28)
RDW: 16.4 % — ABNORMAL HIGH (ref 12.3–15.4)
WBC: 8.1 10*3/uL (ref 3.4–10.8)

## 2018-01-13 LAB — RETICULOCYTES: Retic Ct Pct: 4.7 % — ABNORMAL HIGH (ref 0.6–2.6)

## 2018-01-13 LAB — FERRITIN: Ferritin: 117 ng/mL (ref 15–150)

## 2018-01-13 LAB — VITAMIN D 25 HYDROXY (VIT D DEFICIENCY, FRACTURES): Vit D, 25-Hydroxy: 6.1 ng/mL — ABNORMAL LOW (ref 30.0–100.0)

## 2018-01-13 MED ORDER — ERGOCALCIFEROL 1.25 MG (50000 UT) PO CAPS: 50000.0000 [IU] | ORAL_CAPSULE | ORAL | 1 refills | Status: DC

## 2018-01-13 NOTE — Progress Notes (Signed)
Meds ordered this encounter  Medications  . ergocalciferol (VITAMIN D2) 50000 units capsule    Sig: Take 1 capsule (50,000 Units total) by mouth once a week.    Dispense:  30 capsule    Refill:  [place]  MSN, FNP-C [facility] [address] [place], [place] [address] [phone]

## 2018-01-13 NOTE — Telephone Encounter (Signed)
-----   Message from [name], [place] sent at 01/13/2018 10:18 AM EDT ----- Regarding: lab results Please inform patient that vitamin D level is extremely low, which is consistent with vitamin d deficiency. Will start drisdol 50,000 IU weekly. Will recheck level in 12 weeks.    [name]  MSN, FNP-C [facility] [address] [place], [place] [address] [phone]

## 2018-01-13 NOTE — Telephone Encounter (Signed)
Spoke with patient, advised that vitamin D is extremely low and she needs to start once weekly vitamin D supplement for vitamin d deficiency. Advised that we will need to recheck in 12 weeks. Patient did not want to set an appointment at this time but states she will call back and set. Thanks!

## 2018-01-16 ENCOUNTER — Telehealth: Payer: Self-pay

## 2018-01-16 ENCOUNTER — Other Ambulatory Visit: Payer: Self-pay | Admitting: Family Medicine

## 2018-01-16 DIAGNOSIS — D571 Sickle-cell disease without crisis: Secondary | ICD-10-CM

## 2018-01-16 DIAGNOSIS — Z79891 Long term (current) use of opiate analgesic: Secondary | ICD-10-CM

## 2018-01-16 MED ORDER — MORPHINE SULFATE ER 60 MG PO TBCR: 60.0000 mg | EXTENDED_RELEASE_TABLET | Freq: Two times a day (BID) | ORAL | 0 refills | Status: DC

## 2018-01-16 MED ORDER — OXYCODONE HCL 10 MG PO TABS: 10.0000 mg | ORAL_TABLET | Freq: Four times a day (QID) | ORAL | 0 refills | Status: DC | PRN

## 2018-01-16 MED ORDER — HYDROXYUREA 500 MG PO CAPS: 1000.0000 mg | ORAL_CAPSULE | Freq: Every day | ORAL | 2 refills | Status: DC

## 2018-01-16 MED ORDER — FOLIC ACID 1 MG PO TABS: 1.0000 mg | ORAL_TABLET | Freq: Every day | ORAL | 1 refills | Status: DC

## 2018-01-16 NOTE — Progress Notes (Signed)
Carmen [name], a 27 year old female with a history of chronic pain related to sickle cell anemia. Patient is requesting pain medication. Will reduce opiates by 10%. Oxycodone 10 mg every 6 hours for moderate to severe pain. Will continue MS Contin 60 mg every 6 hours.   Reviewed [place] Substance Reporting system prior to prescribing opiate medications. No inconsistencies noted.  Meds ordered this encounter  Medications  . Oxycodone HCl 10 MG TABS    Sig: Take 1 tablet (10 mg total) by mouth every 6 (six) hours as needed (PAIN).    Dispense:  90 tablet    Refill:  0    Order Specific Question:   Supervising Provider    Answer:   Tresa Garter [id number]  . morphine (MS CONTIN) 60 MG 12 hr tablet    Sig: Take 1 tablet (60 mg total) by mouth every 12 (twelve) hours.    Dispense:  60 tablet    Refill:  0    Order Specific Question:   Supervising Provider    Answer:   Tresa Garter [id number]  . folic acid (FOLVITE) 1 MG tablet    Sig: Take 1 tablet (1 mg total) by mouth daily.    Dispense:  90 tablet    Refill:  1  . hydroxyurea (HYDREA) 500 MG capsule    Sig: Take 2 capsules (1,000 mg total) by mouth daily. May take with food to minimize GI side effects.    Dispense:  60 capsule    Refill:  2  Last UDS date: 01/13/2018 Pain contract signed (Y/N): yes Date narcotic database last reviewed (include red flags): 01/16/2018   [name]  MSN, FNP-C [facility] [address] [place], [place] [address] [phone]

## 2018-01-16 NOTE — Telephone Encounter (Signed)
Waiting on patient's urine tox screen. She was advised of this last Friday 01/13/2018. Thanks!

## 2018-01-18 LAB — TOXASSURE SELECT 13 (MW), URINE

## 2018-02-03 ENCOUNTER — Telehealth: Payer: Self-pay

## 2018-02-05 ENCOUNTER — Encounter (HOSPITAL_COMMUNITY): Payer: Self-pay | Admitting: Emergency Medicine

## 2018-02-05 ENCOUNTER — Emergency Department (HOSPITAL_COMMUNITY)
Admission: EM | Admit: 2018-02-05 | Discharge: 2018-02-05 | Disposition: A | Discharge status: Home / Self Care | Payer: Medicaid Other | Attending: Emergency Medicine | Admitting: Emergency Medicine

## 2018-02-05 DIAGNOSIS — K0889 Other specified disorders of teeth and supporting structures: Secondary | ICD-10-CM | POA: Diagnosis not present

## 2018-02-05 DIAGNOSIS — Z79899 Other long term (current) drug therapy: Secondary | ICD-10-CM | POA: Insufficient documentation

## 2018-02-05 DIAGNOSIS — R220 Localized swelling, mass and lump, head: Secondary | ICD-10-CM

## 2018-02-05 DIAGNOSIS — Z7982 Long term (current) use of aspirin: Secondary | ICD-10-CM | POA: Diagnosis not present

## 2018-02-05 MED ORDER — CLINDAMYCIN HCL 300 MG PO CAPS: 300.0000 mg | ORAL_CAPSULE | Freq: Three times a day (TID) | ORAL | 0 refills | Status: DC

## 2018-02-05 MED ORDER — CLINDAMYCIN HCL 300 MG PO CAPS
300.0000 mg | ORAL_CAPSULE | Freq: Once | ORAL | Status: AC
  Administered 2018-02-05: 300 mg via ORAL
  Filled 2018-02-05: qty 1

## 2018-02-05 MED ORDER — IBUPROFEN 200 MG PO TABS
600.0000 mg | ORAL_TABLET | Freq: Once | ORAL | Status: AC
  Administered 2018-02-05: 600 mg via ORAL
  Filled 2018-02-05: qty 3

## 2018-02-05 NOTE — Discharge Instructions (Addendum)
Please call Dr. Haskell Flirt office for an appointment as soon as possible. Keep your discharge paperwork and bring to their office. State you were seen in the ED today. Start Clindamycin three times daily Take Ibuprofen 600mg  three times a day for pain. You can take extra strength Tylenol with this as well. Continue your home medicine

## 2018-02-05 NOTE — ED Triage Notes (Signed)
Pt right dental pain and swelling for week.

## 2018-02-05 NOTE — ED Provider Notes (Signed)
[facility] DEPT Provider Note   CSN: [id number] Arrival date & time: 02/05/18  1326     History   Chief Complaint Chief Complaint  Patient presents with  . Oral Swelling    HPI Carmen [name] is a 27 y.o. female who presents with right lower dental pain.  Past medical history significant for sickle cell disease on chronic pain medication.  She states that she has had the pain for about a week now.  She has had gradually worsening swelling over her left lower jaw.  She has had issues with this tooth before.  She does not have a dentist.  She states that she has had difficulty finding a dentist who will accept her insurance.  She is been taking BC powder, ibuprofen, Tylenol without significant relief in addition to her prescribed pain medications.  She has not had a fever, difficulty swallowing, shortness of breath.  HPI  Past Medical History:  Diagnosis Date  . Abortion    05/2012  . Headache(784.0)   . Sickle cell crisis [facility])     Patient Active Problem List   Diagnosis Date Noted  . Vitamin D deficiency 01/13/2018  . 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 Hb-SS disease without crisis ([place]) 08/15/2016  . Anemia of chronic disease   . S/P laparoscopic cholecystectomy 11/30/2014  . Thrombocytosis ([place]) 11/22/2014  . Systolic ejection murmur 29/93/7169    Past Surgical History:  Procedure Laterality Date  . CHOLECYSTECTOMY N/A 11/30/2014   Procedure: LAPAROSCOPIC CHOLECYSTECTOMY SINGLE SITE WITH INTRAOPERATIVE CHOLANGIOGRAM;  Surgeon: Michael Boston, MD;  Location: [facility] ORS;  Service: General;  Laterality: N/A;  . SPLENECTOMY       OB History    Gravida  1   Para      Term      Preterm      AB  1   Living        SAB      TAB      Ectopic      Multiple      Live Births               Home Medications    Prior to Admission medications   Medication Sig Start Date End Date Taking? Authorizing Provider   aspirin EC 81 MG tablet Take 1 tablet (81 mg total) by mouth daily. 05/31/17   Leana Gamer, MD  clindamycin (CLEOCIN) 300 MG capsule Take 1 capsule (300 mg total) by mouth 3 (three) times daily. 02/05/18   Recardo Evangelist, PA-C  ergocalciferol (VITAMIN D2) 50000 units capsule Take 1 capsule (50,000 Units total) by mouth once a week. 01/13/18   [name], FNP  folic acid (FOLVITE) 1 MG tablet Take 1 tablet (1 mg total) by mouth daily. 01/16/18 01/16/19  [name], FNP  hydroxyurea (HYDREA) 500 MG capsule Take 2 capsules (1,000 mg total) by mouth daily. May take with food to minimize GI side effects. 01/16/18   [name], FNP  morphine (MS CONTIN) 60 MG 12 hr tablet Take 1 tablet (60 mg total) by mouth every 12 (twelve) hours. 01/16/18   [name], FNP  Oxycodone HCl 10 MG TABS Take 1 tablet (10 mg total) by mouth every 6 (six) hours as needed (PAIN). 01/16/18   [name], FNP    Family History Family History  Problem Relation Age of Onset  . Hypertension Mother   . Sickle cell  anemia Sister   . Kidney disease Sister        Lupus  . Arthritis Sister   . Sickle cell anemia Sister   . Sickle cell trait Sister   . Heart disease Maternal Aunt        CABG  . Heart disease Maternal Uncle        CABG  . Lupus Sister     Social History Social History   Tobacco Use  . Smoking status: Never Smoker  . Smokeless tobacco: Never Used  Substance Use Topics  . Alcohol use: No  . Drug use: No     Allergies   Patient has no known allergies.   Review of Systems Review of Systems  Constitutional: Negative for fever.  HENT: Positive for dental problem and facial swelling.      Physical Exam Updated Vital Signs BP 125/90 (BP Location: Right Arm)   Pulse 70   Temp 99.1 F (37.3 C) (Oral)   Resp 18   Ht 5\' 3"  (1.6 m)   Wt 77.6 kg (171 lb)   LMP 02/03/2018   SpO2 100%   BMI 30.29 kg/m   Physical Exam  Constitutional: She is oriented to  person, place, and time. She appears well-developed and well-nourished. No distress.  Tearful.  Able to speak without difficulty  HENT:  Head: Normocephalic and atraumatic.  Mouth/Throat: Mucous membranes are normal. There is trismus (Moderate) in the jaw. Abnormal dentition. Dental caries present.    Moderate to large amount of swelling over the right jawline  Eyes: Pupils are equal, round, and reactive to light. Conjunctivae are normal. Right eye exhibits no discharge. Left eye exhibits no discharge. No scleral icterus.  Neck: Normal range of motion.  Cardiovascular: Normal rate.  Pulmonary/Chest: Effort normal. No respiratory distress.  Abdominal: She exhibits no distension.  Neurological: She is alert and oriented to person, place, and time.  Skin: Skin is warm and dry.  Psychiatric: She has a normal mood and affect. Her behavior is normal.  Nursing note and vitals reviewed.    ED Treatments / Results  Labs (all labs ordered are listed, but only abnormal results are displayed) Labs Reviewed - No data to display  EKG None  Radiology No results found.  Procedures Procedures (including critical care time)  Medications Ordered in ED Medications  clindamycin (CLEOCIN) capsule 300 mg (300 mg Oral Given 02/05/18 1505)  ibuprofen (ADVIL,MOTRIN) tablet 600 mg (600 mg Oral Given 02/05/18 1505)     Initial Impression / Assessment and Plan / ED Course  I have reviewed the triage vital signs and the nursing notes.  Pertinent labs & imaging results that were available during my care of the patient were reviewed by me and considered in my medical decision making (see chart for details).  Dental pain associated with dental caries and likely dental infection vs abscess. Patient is afebrile, non toxic appearing, and swallowing secretions well. I gave patient referral to dentist and stressed the importance of dental follow up for ultimate management of dental pain. I have also discussed  reasons to return immediately to the ER.  Patient expresses understanding and agrees with plan. She was advised to continue her home pain medicines, add scheduled Ibuprofen, and was given an rx for Clindamycin. Strict return precautions were given.   Final Clinical Impressions(s) / ED Diagnoses   Final diagnoses:  Pain, dental  Right facial swelling    ED Discharge Orders        Ordered  clindamycin (CLEOCIN) 300 MG capsule  3 times daily     02/05/18 1455       Recardo Evangelist, Hershal Cor[place] 02/05/18 1514    Blanchie Dessert, MD 02/05/18 2055

## 2018-02-06 ENCOUNTER — Other Ambulatory Visit: Payer: Self-pay | Admitting: Family Medicine

## 2018-02-06 DIAGNOSIS — Z79891 Long term (current) use of opiate analgesic: Secondary | ICD-10-CM

## 2018-02-06 DIAGNOSIS — D571 Sickle-cell disease without crisis: Secondary | ICD-10-CM

## 2018-02-06 MED ORDER — OXYCODONE HCL 10 MG PO TABS: 10.0000 mg | ORAL_TABLET | Freq: Four times a day (QID) | ORAL | 0 refills | Status: DC | PRN

## 2018-02-06 NOTE — Progress Notes (Signed)
Reviewed [place] Substance Reporting system prior to prescribing opiate medications. No inconsistencies noted.    Meds ordered this encounter  Medications  . Oxycodone HCl 10 MG TABS    Sig: Take 1 tablet (10 mg total) by mouth every 6 (six) hours as needed (PAIN).    Dispense:  90 tablet    Refill:  0    Order Specific Question:   Supervising Provider    Answer:   Tresa Garter [[id number]]    [name]  MSN, FNP-C [facility] [address] [place], [place] [address] [phone]

## 2018-02-13 ENCOUNTER — Ambulatory Visit: Payer: Medicaid Other | Admitting: Family Medicine

## 2018-02-15 ENCOUNTER — Ambulatory Visit (INDEPENDENT_AMBULATORY_CARE_PROVIDER_SITE_OTHER): Payer: Medicaid Other | Admitting: Family Medicine

## 2018-02-15 ENCOUNTER — Encounter: Payer: Self-pay | Admitting: Family Medicine

## 2018-02-15 VITALS — BP 118/66 | HR 99 | Temp 98.6°F | Resp 16 | Ht 63.0 in | Wt 165.6 lb

## 2018-02-15 DIAGNOSIS — Z79891 Long term (current) use of opiate analgesic: Secondary | ICD-10-CM

## 2018-02-15 DIAGNOSIS — D571 Sickle-cell disease without crisis: Secondary | ICD-10-CM | POA: Diagnosis not present

## 2018-02-15 DIAGNOSIS — Z01419 Encounter for gynecological examination (general) (routine) without abnormal findings: Secondary | ICD-10-CM

## 2018-02-15 LAB — POCT URINALYSIS DIP (MANUAL ENTRY)
Glucose, UA: NEGATIVE mg/dL
Nitrite, UA: NEGATIVE
PH UA: 5.5 (ref 5.0–8.0)
Spec Grav, UA: 1.015 (ref 1.010–1.025)
Urobilinogen, UA: 0.2 E.U./dL

## 2018-02-15 MED ORDER — MORPHINE SULFATE ER 60 MG PO TBCR: 60.0000 mg | EXTENDED_RELEASE_TABLET | Freq: Two times a day (BID) | ORAL | 0 refills | Status: DC

## 2018-02-15 NOTE — Patient Instructions (Signed)
Will follow up by phone with any abnormal laboratory results.  Increase water intake to 64 ounces per day.  Pap Test Why am I having this test? A pap test is sometimes called a pap smear. It is a screening test that is used to check for signs of cancer of the vagina, cervix, and uterus. The test can also identify the presence of infection or precancerous changes. Your health care provider will likely recommend you have this test done on a regular basis. This test may be done:  Every 3 years, starting at age 33.  Every 5 years, in combination with testing for the presence of human papillomavirus (HPV).  More or less often depending on other medical conditions.  What kind of sample is taken? Using a small cotton swab, plastic spatula, or brush, your health care provider will collect a sample of cells from the surface of your cervix. Your cervix is the opening to your uterus, also called a womb. Secretions from the cervix and vagina may also be collected. How do I prepare for this test?  Be aware of where you are in your menstrual cycle. You may be asked to reschedule the test if you are menstruating on the day of the test.  You may need to reschedule if you have a known vaginal infection on the day of the test.  You may be asked to avoid douching or taking a bath the day before or the day of the test.  Some medicines can cause abnormal test results, such as digitalis and tetracycline. Talk with your health care provider before your test if you take one of these medicines. What do the results mean? Abnormal test results may indicate a number of health conditions. These may include:  Cancer. Although pap test results cannot be used to diagnose cancer of the cervix, vagina, or uterus, they may suggest the possibility of cancer. Further tests would be required to determine if cancer is present.  Sexually transmitted disease.  Fungal infection.  Parasite infection.  Herpes  infection.  A condition causing or contributing to infertility.  It is your responsibility to obtain your test results. Ask the lab or department performing the test when and how you will get your results. Contact your health care provider to discuss any questions you have about your results. Talk with your health care provider to discuss your results, treatment options, and if necessary, the need for more tests. Talk with your health care provider if you have any questions about your results. This information is not intended to replace advice given to you by your health care provider. Make sure you discuss any questions you have with your health care provider. Document Released: 01/08/2003 Document Revised: 06/23/2016 Document Reviewed: 03/11/2014 Elsevier Interactive Patient Education  2018 [place]. Sickle Cell Anemia, Adult Sickle cell anemia is a condition where your red blood cells are shaped like sickles. Red blood cells carry oxygen through the body. Sickle-shaped red blood cells do not live as long as normal red blood cells. They also clump together and block blood from flowing through the blood vessels. These things prevent the body from getting enough oxygen. Sickle cell anemia causes organ damage and pain. It also increases the risk of infection. Follow these instructions at home:  Drink enough fluid to keep your pee (urine) clear or pale yellow. Drink more in hot weather and during exercise.  Do not smoke. Smoking lowers oxygen levels in the blood.  Only take over-the-counter or prescription medicines  as told by your doctor.  Take antibiotic medicines as told by your doctor. Make sure you finish them even if you start to feel better.  Take supplements as told by your doctor.  Consider wearing a medical alert bracelet. This tells anyone caring for you in an emergency of your condition.  When traveling, keep your medical information, doctors' names, and the medicines you take  with you at all times.  If you have a fever, do not take fever medicines right away. This could cover up a problem. Tell your doctor.  Keep all follow-up visits with your doctor. Sickle cell anemia requires regular medical care. Contact a doctor if: You have a fever. Get help right away if:  You feel dizzy or faint.  You have new belly (abdominal) pain, especially on the left side near the stomach area.  You have a lasting, often uncomfortable and painful erection of the penis (priapism). If it is not treated right away, you will become unable to have sex (impotence).  You have numbness in your arms or legs or you have a hard time moving them.  You have a hard time talking.  You have a fever or lasting symptoms for more than 2-3 days.  You have a fever and your symptoms suddenly get worse.  You have signs or symptoms of infection. These include: ? Chills. ? Being more tired than normal (lethargy). ? Irritability. ? Poor eating. ? Throwing up (vomiting).  You have pain that is not helped with medicine.  You have shortness of breath.  You have pain in your chest.  You are coughing up pus-like or bloody mucus.  You have a stiff neck.  Your feet or hands swell or have pain.  Your belly looks bloated.  Your joints hurt. This information is not intended to replace advice given to you by your health care provider. Make sure you discuss any questions you have with your health care provider. Document Released: 08/08/2013 Document Revised: 03/25/2016 Document Reviewed: 05/30/2013 Elsevier Interactive Patient Education  2017 [employer].

## 2018-02-16 LAB — CBC WITH DIFFERENTIAL/PLATELET
Basophils Absolute: 0 10*3/uL (ref 0.0–0.2)
Basos: 0 %
EOS (ABSOLUTE): 0.1 10*3/uL (ref 0.0–0.4)
Eos: 0 %
Hematocrit: 30.3 % — ABNORMAL LOW (ref 34.0–46.6)
Hemoglobin: 10 g/dL — ABNORMAL LOW (ref 11.1–15.9)
IMMATURE GRANULOCYTES: 0 %
Immature Grans (Abs): 0 10*3/uL (ref 0.0–0.1)
Lymphocytes Absolute: 3.4 10*3/uL — ABNORMAL HIGH (ref 0.7–3.1)
Lymphs: 30 %
MCH: 29 pg (ref 26.6–33.0)
MCHC: 33 g/dL (ref 31.5–35.7)
MCV: 88 fL (ref 79–97)
MONOS ABS: 1.5 10*3/uL — AB (ref 0.1–0.9)
Monocytes: 13 %
NEUTROS PCT: 57 %
Neutrophils Absolute: 6.3 10*3/uL (ref 1.4–7.0)
PLATELETS: 649 10*3/uL — AB (ref 150–379)
RBC: 3.45 x10E6/uL — AB (ref 3.77–5.28)
RDW: 16.4 % — AB (ref 12.3–15.4)
WBC: 11.4 10*3/uL — AB (ref 3.4–10.8)

## 2018-02-16 LAB — RETICULOCYTES: Retic Ct Pct: 3.8 % — ABNORMAL HIGH (ref 0.6–2.6)

## 2018-02-17 ENCOUNTER — Other Ambulatory Visit: Payer: Self-pay | Admitting: Family Medicine

## 2018-02-17 DIAGNOSIS — A5901 Trichomonal vulvovaginitis: Secondary | ICD-10-CM

## 2018-02-17 MED ORDER — METRONIDAZOLE 500 MG PO TABS: 500.0000 mg | ORAL_TABLET | Freq: Two times a day (BID) | ORAL | 0 refills | Status: DC

## 2018-02-17 NOTE — Progress Notes (Signed)
Reviewed labs, trichomonas vaginitis present: Added the following medication:   Meds ordered this encounter  Medications  . metroNIDAZOLE (FLAGYL) 500 MG tablet    Sig: Take 1 tablet (500 mg total) by mouth 2 (two) times daily.    Dispense:  14 tablet    Refill:  0   Attempted to contact patient without success. Will try to call at a later time.   [name]  MSN, FNP-C [facility] [address] [place], [place] [address] [phone]

## 2018-02-18 LAB — IGP,CTNGTV,RFX APTIMA HPV ASCU
Chlamydia, Nuc. Acid Amp: NEGATIVE
Gonococcus, Nuc. Acid Amp: NEGATIVE
PAP SMEAR COMMENT: 0
Trich vag by NAA: POSITIVE — AB

## 2018-02-20 ENCOUNTER — Telehealth: Payer: Self-pay

## 2018-02-20 NOTE — Telephone Encounter (Signed)
Patient returned call. I advised that pap was negative but vaginal sample was positive for trichomonas vaginitis. Advised that we will treat with metronidazole 500mg  twice daily for 7 days. Asked that she refrain from using alcohol while on medication and use barrier protections with sexual intercourse. Patient verbalized understanding. Thanks!

## 2018-02-20 NOTE — Telephone Encounter (Signed)
Called, no answer. Left message for patient to call back. Thanks!  

## 2018-02-20 NOTE — Telephone Encounter (Signed)
-----   Message from [name], [place] sent at 02/20/2018  5:37 AM EDT ----- Regarding: lab results Please inform patient that pap smear is negative,will repeat in 3 years per current guidelines. Vaginal sample yielded trichomonas vaginitis, which is a parasite mostly transmitted through sexual contact.  Will start Metronidazole 500 mg BID for 7 days. Refrain from alcohol while taking metronidazole. Recommend barrier protection with sexual intercourse. Follow up in office as scheduled.  [name]  MSN, FNP-C [facility] [address] [place], [place] [address] [phone]

## 2018-02-21 NOTE — Progress Notes (Signed)
Subjective:     Carmen [name] is a 27 y.o. female with a history of sickle cell anemia and chronic pain presents for a routine gynecological exam. Patient has not had a pap smear in greater than 3 years. She does not have a history of HPV. She is sexually active and uses barrier protection. She is not on any other form of birth control. here for a routine exam.  Carmen [name] does not per form monthly self-breast exams consistently. Patient is a non smoker and is up to date with immunizations. Patient denies fatigue, vaginal discharge, itching, burning, dyspareunia, nausea, vomiting, diarrhea, or dysuria.  Past Medical History:  Diagnosis Date  . Abortion    05/2012  . Headache(784.0)   . Sickle cell crisis [facility])    Social History   Socioeconomic History  . Marital status: Single    Spouse name: Not on file  . Number of children: Not on file  . Years of education: Not on file  . Highest education level: Not on file  Occupational History  . Not on file  Social Needs  . Financial resource strain: Not on file  . Food insecurity:    Worry: Not on file    Inability: Not on file  . Transportation needs:    Medical: Not on file    Non-medical: Not on file  Tobacco Use  . Smoking status: Never Smoker  . Smokeless tobacco: Never Used  Substance and Sexual Activity  . Alcohol use: No  . Drug use: No  . Sexual activity: Yes    Birth control/protection: None  Lifestyle  . Physical activity:    Days per week: Not on file    Minutes per session: Not on file  . Stress: Not on file  Relationships  . Social connections:    Talks on phone: Not on file    Gets together: Not on file    Attends religious service: Not on file    Active member of club or organization: Not on file    Attends meetings of clubs or organizations: Not on file    Relationship status: Not on file  . Intimate partner violence:    Fear of current or ex partner: Not on file    Emotionally abused: Not on file    Physically  abused: Not on file    Forced sexual activity: Not on file  Other Topics Concern  . Not on file  Social History Narrative  . Not on file   Immunization History  Administered Date(s) Administered  . Influenza,inj,Quad PF,6+ Mos 11/13/2013, 09/10/2015, 08/17/2016, 07/18/2017  . Pneumococcal Polysaccharide-23 11/13/2013  . Tdap 11/11/2011    Gynecologic History Patient's last menstrual period was 02/03/2018. Contraception: condoms Obstetric History OB History  Gravida Para Term Preterm AB Living  1       1    SAB TAB Ectopic Multiple Live Births               # Outcome Date GA Lbr Len/2nd Weight Sex Delivery Anes PTL Lv  1 AB 04/2012        DEC  Review of Systems  Constitutional: Negative.   HENT: Negative.   Eyes: Negative.   Respiratory: Negative.   Cardiovascular: Negative.   Genitourinary: Negative.   Musculoskeletal: Positive for joint pain.  Skin: Negative.   Neurological: Negative.   Endo/Heme/Allergies: Negative.   Psychiatric/Behavioral: Negative.     Objective:  Physical Exam  Constitutional: She is oriented to person, place, and  time. She appears well-developed and well-nourished.  Cardiovascular: Normal rate, regular rhythm and normal heart sounds.  Pulmonary/Chest: Effort normal and breath sounds normal. Right breast exhibits no inverted nipple, no mass, no nipple discharge, no skin change and no tenderness. Left breast exhibits no inverted nipple, no mass, no nipple discharge, no skin change and no tenderness.  Breast exam unremarkable  Abdominal: Soft. Bowel sounds are normal.  Genitourinary: There is no lesion on the right labia. There is no lesion on the left labia. Right adnexum displays no tenderness. Left adnexum displays no tenderness. Vaginal discharge (thick, white) found.  Musculoskeletal: Normal range of motion.  Lymphadenopathy: No inguinal adenopathy noted on the right or left side.  Neurological: She is alert and oriented to person, place,  and time.  Skin: Skin is warm and dry.     Assessment:    Healthy female exam.    Plan:  1. Hb-SS disease without crisis ([place]) Sickle cell disease - Continue Hydrea 1000 mg daily. Will check CBC for absolute neutrophil count and platelets. Will also check reticulocyte count. Will consider increasing dosage. We discussed the need for good hydration, monitoring of hydration status, avoidance of heat, cold, stress, and infection triggers. We discussed the risks and benefits of Hydrea, including bone marrow suppression, the possibility of GI upset, skin ulcers, hair thinning, and teratogenicity. Reminded Ms. [place] Crane of the needed to use contraception while on hydroxyurea due to teratogenicity. The patient was reminded of the need to seek medical attention of any symptoms of bleeding, anemia, or infection. Will start folic acid 1 mg daily to prevent aplastic bone marrow crises.  - POCT urinalysis dipstick - CBC with Differential - Reticulocytes - morphine (MS CONTIN) 60 MG 12 hr tablet; Take 1 tablet (60 mg total) by mouth every 12 (twelve) hours.  Dispense: 60 tablet; Refill: 0  2. Pap smear, as part of routine gynecological examination  Recommend monthly self-breast exams.  - IGP,CtNgTv,rfx Aptima HPV ASCU  3. Chronic prescription opiate use Reviewed [place] Substance Reporting system prior to prescribing opiate medications. No inconsistencies noted.   - morphine (MS CONTIN) 60 MG 12 hr tablet; Take 1 tablet (60 mg total) by mouth every 12 (twelve) hours.  Dispense: 60 tablet; Refill: 0      RTC: 3 months for chronic conditions  [name]  MSN, FNP-C Patient [place] [address] [place], [place] [address] [phone]

## 2018-03-13 ENCOUNTER — Telehealth: Payer: Self-pay

## 2018-03-13 DIAGNOSIS — G8929 Other chronic pain: Secondary | ICD-10-CM | POA: Insufficient documentation

## 2018-03-13 DIAGNOSIS — M7918 Myalgia, other site: Secondary | ICD-10-CM

## 2018-03-13 DIAGNOSIS — Z79891 Long term (current) use of opiate analgesic: Secondary | ICD-10-CM | POA: Insufficient documentation

## 2018-03-15 ENCOUNTER — Other Ambulatory Visit: Payer: Self-pay | Admitting: Internal Medicine

## 2018-03-15 DIAGNOSIS — Z79891 Long term (current) use of opiate analgesic: Secondary | ICD-10-CM

## 2018-03-15 DIAGNOSIS — D571 Sickle-cell disease without crisis: Secondary | ICD-10-CM

## 2018-03-15 MED ORDER — OXYCODONE HCL 10 MG PO TABS: 10.0000 mg | ORAL_TABLET | Freq: Four times a day (QID) | ORAL | 0 refills | Status: DC | PRN

## 2018-03-15 NOTE — Telephone Encounter (Signed)
Refilled

## 2018-03-16 ENCOUNTER — Telehealth: Payer: Self-pay

## 2018-03-16 ENCOUNTER — Other Ambulatory Visit: Payer: Self-pay | Admitting: Family Medicine

## 2018-03-16 ENCOUNTER — Other Ambulatory Visit: Payer: Medicaid Other

## 2018-03-16 DIAGNOSIS — Z79891 Long term (current) use of opiate analgesic: Secondary | ICD-10-CM

## 2018-03-16 DIAGNOSIS — D571 Sickle-cell disease without crisis: Secondary | ICD-10-CM

## 2018-03-16 MED ORDER — OXYCODONE HCL 10 MG PO TABS: 10.0000 mg | ORAL_TABLET | Freq: Four times a day (QID) | ORAL | 0 refills | Status: DC | PRN
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
## 2018-03-20 ENCOUNTER — Ambulatory Visit (INDEPENDENT_AMBULATORY_CARE_PROVIDER_SITE_OTHER): Payer: Medicaid Other | Admitting: Family Medicine

## 2018-03-20 VITALS — BP 121/74 | HR 90 | Temp 99.2°F | Resp 14 | Ht 63.0 in | Wt 165.0 lb

## 2018-03-20 DIAGNOSIS — D571 Sickle-cell disease without crisis: Secondary | ICD-10-CM | POA: Diagnosis not present

## 2018-03-20 DIAGNOSIS — Z79891 Long term (current) use of opiate analgesic: Secondary | ICD-10-CM

## 2018-03-20 LAB — POCT URINALYSIS DIPSTICK
BILIRUBIN UA: NEGATIVE
Blood, UA: NEGATIVE
GLUCOSE UA: NEGATIVE
KETONES UA: NEGATIVE
Nitrite, UA: NEGATIVE
Protein, UA: NEGATIVE
Spec Grav, UA: 1.01 (ref 1.010–1.025)
UROBILINOGEN UA: 1 U/dL
pH, UA: 6.5 (ref 5.0–8.0)

## 2018-03-20 IMAGING — DX DG CHEST 2V
2 series · 2 of 2 positions shown · non-contrast
Comparison: 08/15/2016

CLINICAL DATA: Sickle cell pain crisis for 3 days.

EXAM:
CHEST  2 VIEW

[chest pa]
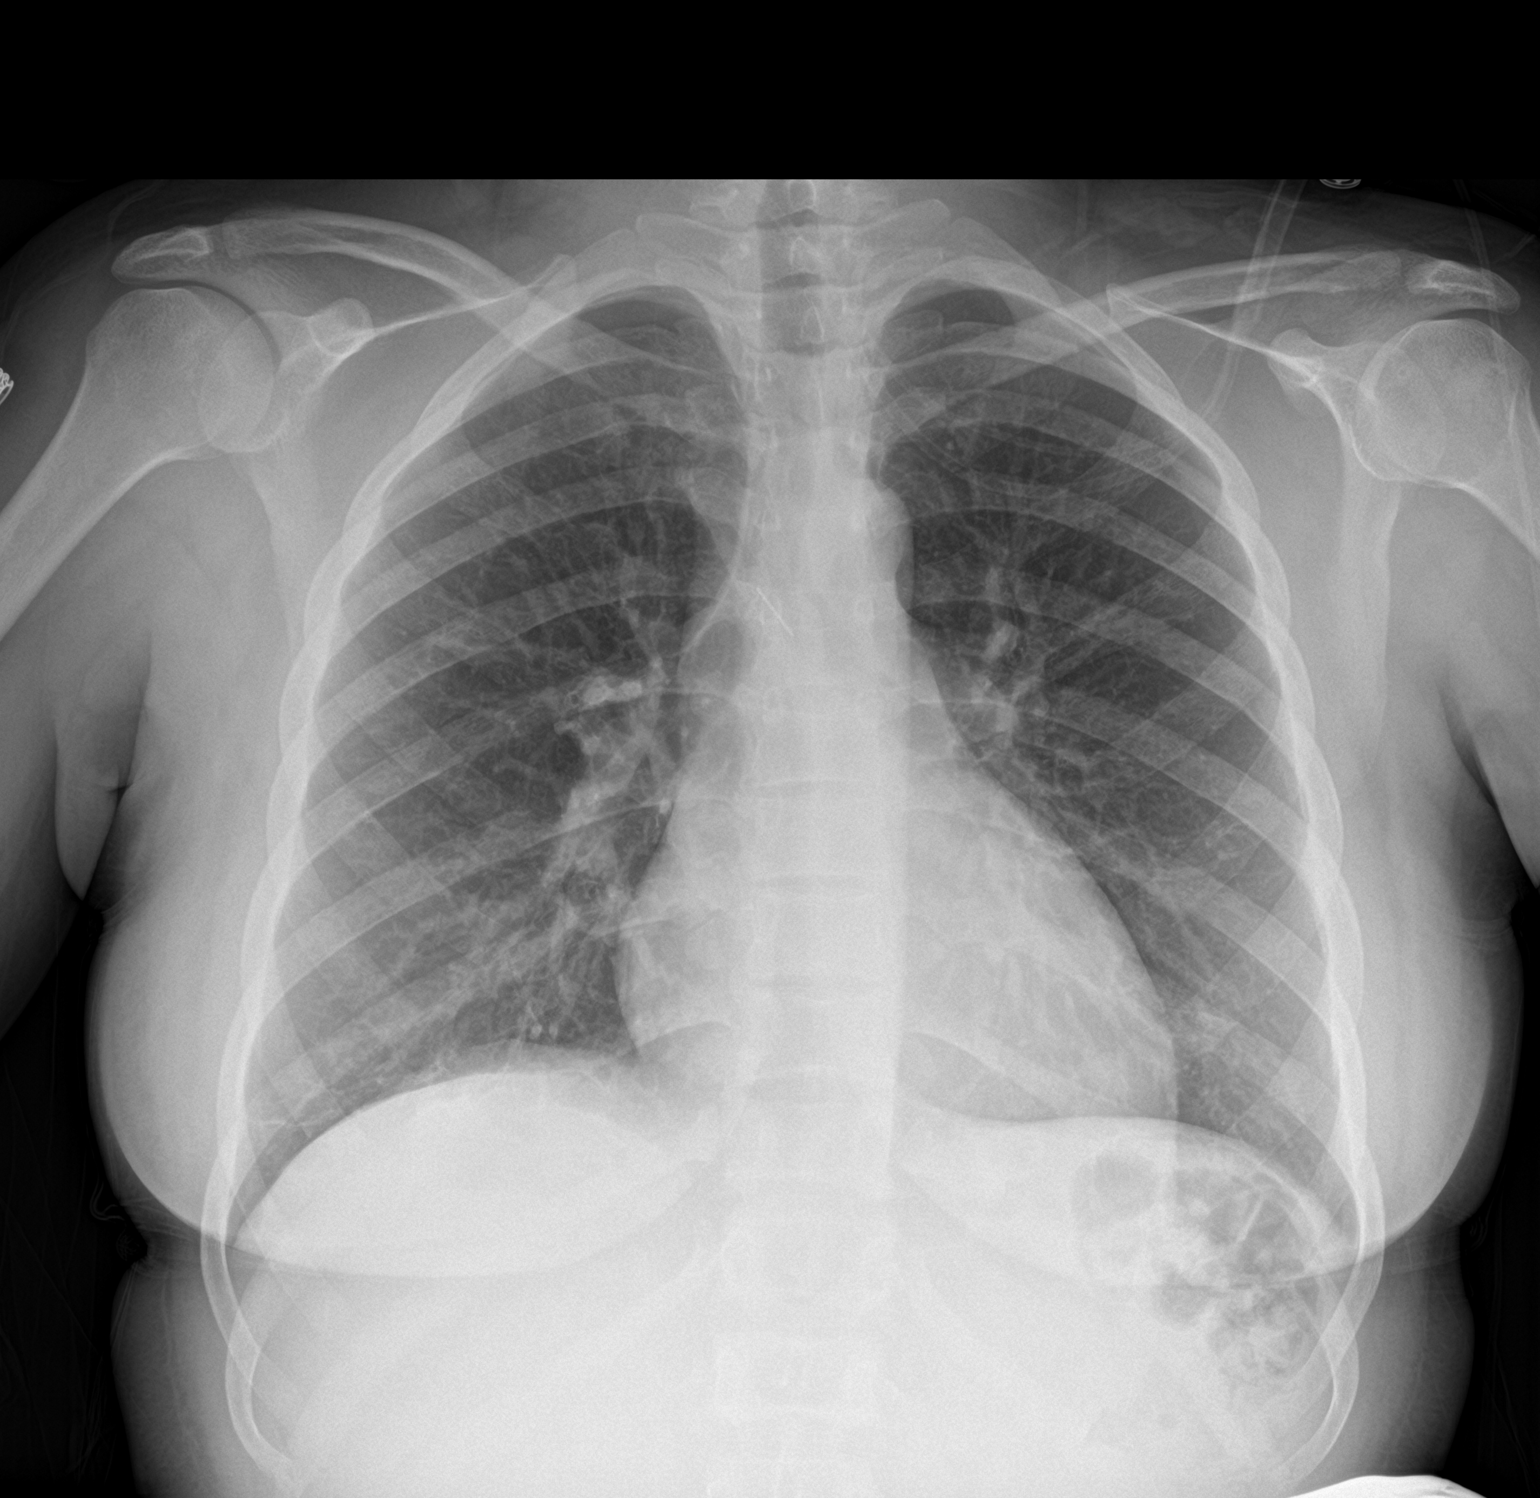

[chest lat]
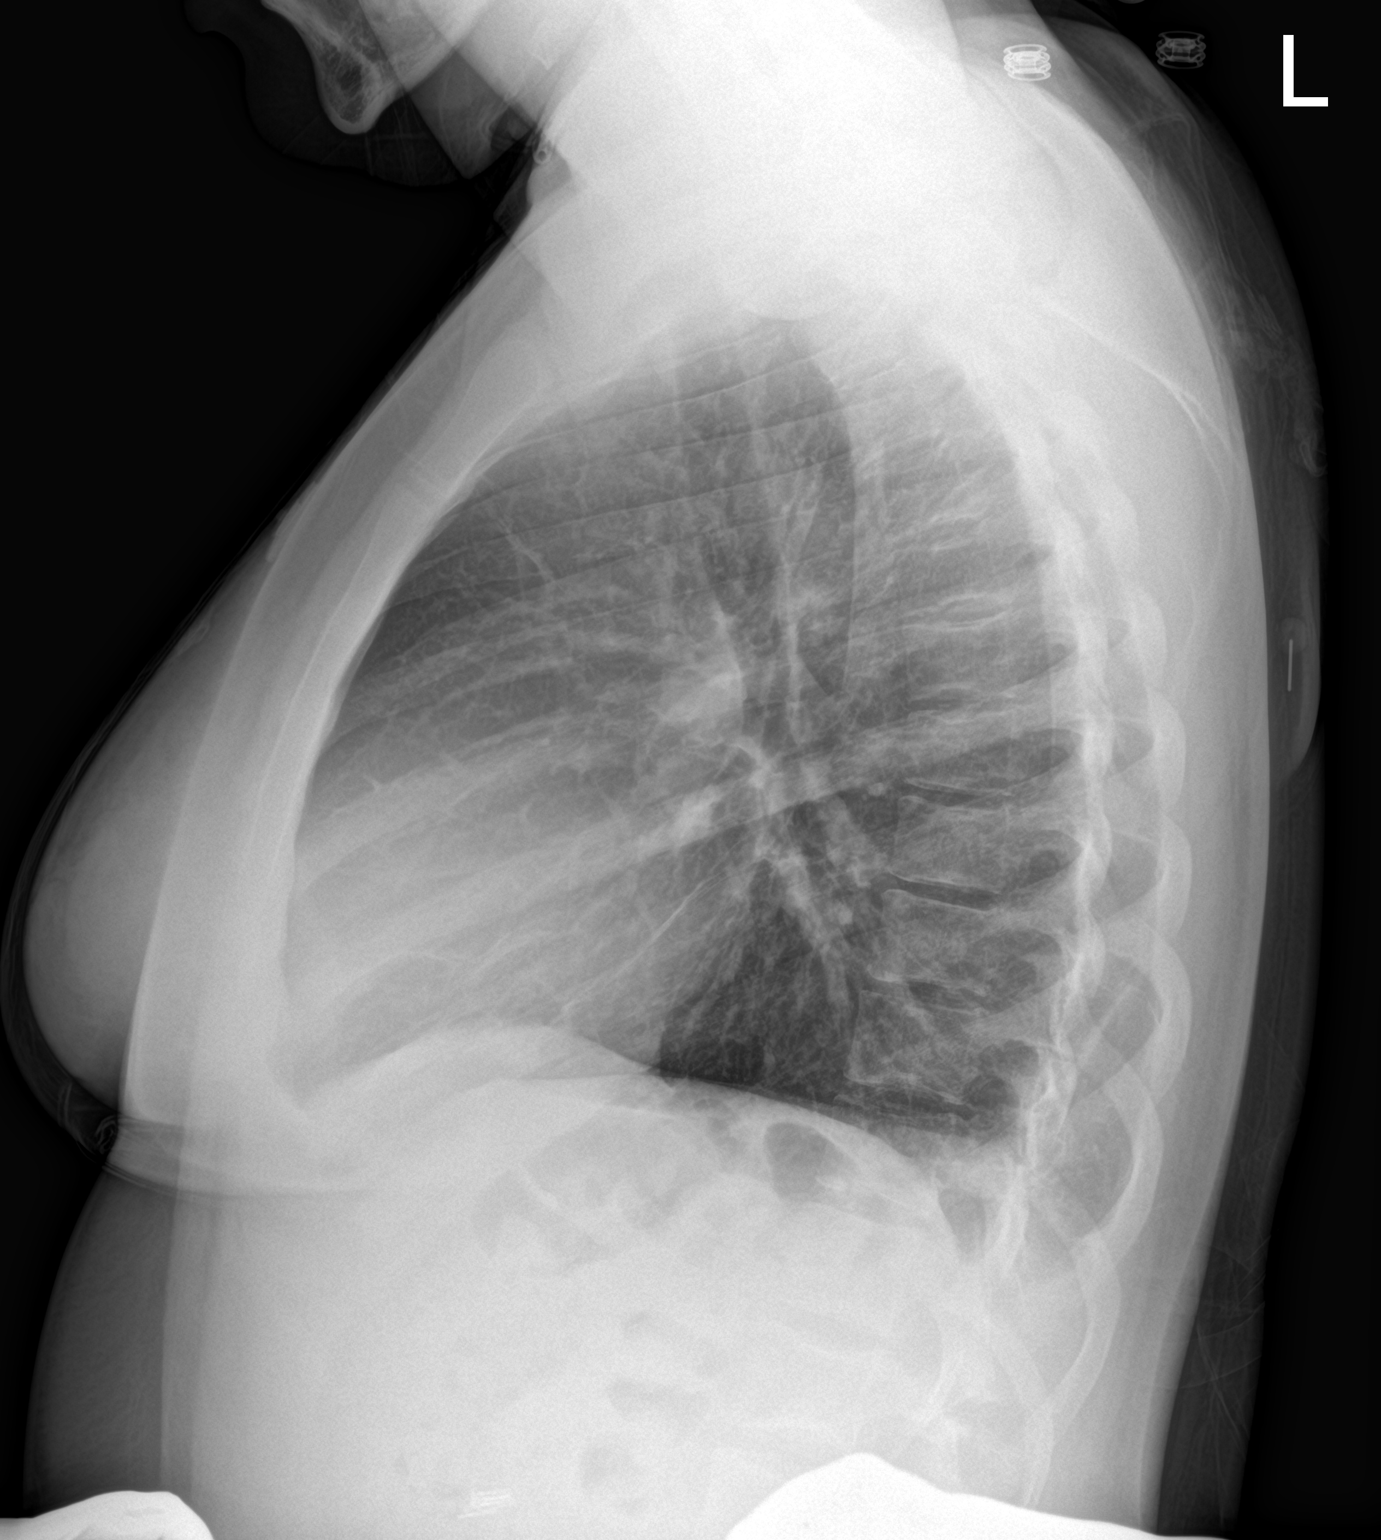

[2 of 2 positions shown; findings below may reference images not displayed]

FINDINGS: The heart size and mediastinal contours are within normal limits.
Mild scarring again noted in right lung base. No evidence of
pulmonary infiltrate or edema. No evidence of pleural effusion or
pneumothorax.
IMPRESSION: No active cardiopulmonary disease.

## 2018-03-20 MED ORDER — MORPHINE SULFATE ER 60 MG PO TBCR: 60.0000 mg | EXTENDED_RELEASE_TABLET | Freq: Two times a day (BID) | ORAL | 0 refills | Status: DC

## 2018-03-20 NOTE — Progress Notes (Signed)
Subjective:    Patient ID: Carmen [name], female    DOB: 09-May-1991, 27 y.o.   MRN: [id number]  HPI  [name], a 27 year old female with a history of sickle cell anemia, HbSS presents for a follow up of sickle cell anemia and medication managment.   She says that she eats a balanced diet and hydrates consistently.  Patient has been taking folic acid and hydroxyurea consistently without side effects.  Current pain intensity is 3-4/10 characterized as intermittent and throbbing.  Patient says that she is up-to-date with vaccinations.  She currently denies headache, chest pains, shortness of breath, paresthesias, dysuria, nausea, vomiting, or diarrhea.  Patient is requesting prescriptions for chronic opiate medications.  Patient has not been hospitalized for sickle cell crisis over the past several months.  Past Medical History:  Diagnosis Date  . Abortion    05/2012  . Headache(784.0)   . Sickle cell crisis [facility])    Social History   Socioeconomic History  . Marital status: Single    Spouse name: Not on file  . Number of children: Not on file  . Years of education: Not on file  . Highest education level: Not on file  Occupational History  . Not on file  Social Needs  . Financial resource strain: Not on file  . Food insecurity:    Worry: Not on file    Inability: Not on file  . Transportation needs:    Medical: Not on file    Non-medical: Not on file  Tobacco Use  . Smoking status: Never Smoker  . Smokeless tobacco: Never Used  Substance and Sexual Activity  . Alcohol use: No  . Drug use: No  . Sexual activity: Yes    Birth control/protection: None  Lifestyle  . Physical activity:    Days per week: Not on file    Minutes per session: Not on file  . Stress: Not on file  Relationships  . Social connections:    Talks on phone: Not on file    Gets together: Not on file    Attends religious service: Not on file    Active member of club or organization: Not on file   Attends meetings of clubs or organizations: Not on file    Relationship status: Not on file  . Intimate partner violence:    Fear of current or ex partner: Not on file    Emotionally abused: Not on file    Physically abused: Not on file    Forced sexual activity: Not on file  Other Topics Concern  . Not on file  Social History Narrative  . Not on file   Immunization History  Administered Date(s) Administered  . Influenza,inj,Quad PF,6+ Mos 11/13/2013, 09/10/2015, 08/17/2016, 07/18/2017  . Pneumococcal Polysaccharide-23 11/13/2013  . Tdap 11/11/2011    Review of Systems  Constitutional: Negative.   HENT: Negative.   Eyes: Negative.   Respiratory: Negative.   Cardiovascular: Negative.   Gastrointestinal: Negative.   Endocrine: Negative for polydipsia, polyphagia and polyuria.  Genitourinary: Negative.   Musculoskeletal: Positive for arthralgias and back pain.  Skin: Negative.   Neurological: Negative.   Hematological: Negative.   Psychiatric/Behavioral: Negative.       Objective:   Physical Exam  Constitutional: She is oriented to person, place, and time. She appears well-developed and well-nourished.  HENT:  Head: Normocephalic and atraumatic.  Right Ear: External ear normal.  Left Ear: External ear normal.  Nose: Nose normal.  Mouth/Throat: Oropharynx  is clear and moist.  Cardiovascular: Normal rate, regular rhythm, normal heart sounds and intact distal pulses.  Pulmonary/Chest: Effort normal and breath sounds normal.  Abdominal: Soft. Bowel sounds are normal.  Neurological: She is alert and oriented to person, place, and time. She has normal reflexes.  Skin: Skin is warm and dry.  Psychiatric: She has a normal mood and affect. Her behavior is normal. Judgment and thought content normal.      BP 121/74 (BP Location: Left Arm, Patient Position: Sitting, Cuff Size: Normal)   Pulse 90   Temp 99.2 F (37.3 C) (Oral)   Resp 14   Ht 5\' 3"  (1.6 m)   Wt 165 lb (74.8  kg)   LMP 03/01/2018   SpO2 100%   BMI 29.23 kg/m  Assessment & Plan:  1. Hb-SS disease without crisis ([place]) We will continue folic acid 1 mg and hydroxyurea.   Pulmonary evaluation - Patient denies severe recurrent wheezes, shortness of breath with exercise, or persistent cough. If these symptoms develop, pulmonary function tests with spirometry will be ordered, and if abnormal, plan on referral to Pulmonology for further evaluation.  Cardiac - Routine screening for pulmonary hypertension is not recommended.  Eye - High risk of proliferative retinopathy. Annual eye exam with retinal exam recommended to patient. Last eye examination 6 months ago  Immunization status - Patient will receive influenza vaccination on today  Acute and chronic painful episodesInformed patient that we will have to reduce medications by 10% when due in order to prescribe in primary care. Patient expressed understanding. I will review tox screen as it becomes available. . Pt is also aware that the prescription history is available to [place] online through the [place]. Controlled substance agreement signed 01/12/2018. We reminded Ms. Radebaugh that all patients receiving Schedule II narcotics must be seen for follow within one month of prescription being requested. We reviewed the terms of our pain agreement, including the need to keep medicines in a safe locked location away from children or pets, and the need to report excess sedation or constipation, measures to avoid constipation, and policies related to early refills and stolen prescriptions. According to the [place] [facility] program, we have reviewed details related to analgesia, adverse effects, aberrant behaviors. Reviewed [place] Substance Reporting system prior to prescribing opiate medication, no inconsistencies noted.   Recently reviewed urine drug screen, no inconsistencies noted. - morphine (MS CONTIN) 60 MG 12 hr tablet; Take 1 tablet (60 mg total) by mouth  every 12 (twelve) hours.  Dispense: 60 tablet; Refill: 0 - CBC with Differential - Reticulocytes - Urinalysis Dipstick  2. Chronic prescription opiate use Reviewed [place] Substance Reporting system prior to prescribing opiate medications. No inconsistencies noted.   - morphine (MS CONTIN) 60 MG 12 hr tablet; Take 1 tablet (60 mg total) by mouth every 12 (twelve) hours.  Dispense: 60 tablet; Refill: 0  RTC: 1 month for sickle cell anemia and medication management  [name]  MSN, FNP-C Patient [facility] [address] [place], [place] [address] [phone]

## 2018-03-21 ENCOUNTER — Encounter: Payer: Self-pay | Admitting: Family Medicine

## 2018-03-21 LAB — CBC WITH DIFFERENTIAL/PLATELET
BASOS: 1 %
Basophils Absolute: 0 10*3/uL (ref 0.0–0.2)
EOS (ABSOLUTE): 0.1 10*3/uL (ref 0.0–0.4)
EOS: 1 %
HEMATOCRIT: 27.8 % — AB (ref 34.0–46.6)
HEMOGLOBIN: 9.2 g/dL — AB (ref 11.1–15.9)
IMMATURE GRANS (ABS): 0 10*3/uL (ref 0.0–0.1)
IMMATURE GRANULOCYTES: 0 %
LYMPHS: 33 %
Lymphocytes Absolute: 2.9 10*3/uL (ref 0.7–3.1)
MCH: 29 pg (ref 26.6–33.0)
MCHC: 33.1 g/dL (ref 31.5–35.7)
MCV: 88 fL (ref 79–97)
MONOCYTES: 13 %
MONOS ABS: 1.1 10*3/uL — AB (ref 0.1–0.9)
NEUTROS PCT: 52 %
Neutrophils Absolute: 4.6 10*3/uL (ref 1.4–7.0)
Platelets: 648 10*3/uL — ABNORMAL HIGH (ref 150–450)
RBC: 3.17 x10E6/uL — AB (ref 3.77–5.28)
RDW: 18.5 % — AB (ref 12.3–15.4)
WBC: 8.7 10*3/uL (ref 3.4–10.8)

## 2018-03-21 LAB — RETICULOCYTES: Retic Ct Pct: 8.3 % — ABNORMAL HIGH (ref 0.6–2.6)

## 2018-03-21 NOTE — Patient Instructions (Signed)
Sickle Cell Anemia, Adult °Sickle cell anemia is a condition where your red blood cells are shaped like sickles. Red blood cells carry oxygen through the body. Sickle-shaped red blood cells do not live as long as normal red blood cells. They also clump together and block blood from flowing through the blood vessels. These things prevent the body from getting enough oxygen. Sickle cell anemia causes organ damage and pain. It also increases the risk of infection. °Follow these instructions at home: °· Drink enough fluid to keep your pee (urine) clear or pale yellow. Drink more in hot weather and during exercise. °· Do not smoke. Smoking lowers oxygen levels in the blood. °· Only take over-the-counter or prescription medicines as told by your doctor. °· Take antibiotic medicines as told by your doctor. Make sure you finish them even if you start to feel better. °· Take supplements as told by your doctor. °· Consider wearing a medical alert bracelet. This tells anyone caring for you in an emergency of your condition. °· When traveling, keep your medical information, doctors' names, and the medicines you take with you at all times. °· If you have a fever, do not take fever medicines right away. This could cover up a problem. Tell your doctor. °· Keep all follow-up visits with your doctor. Sickle cell anemia requires regular medical care. °Contact a doctor if: °You have a fever. °Get help right away if: °· You feel dizzy or faint. °· You have new belly (abdominal) pain, especially on the left side near the stomach area. °· You have a lasting, often uncomfortable and painful erection of the penis (priapism). If it is not treated right away, you will become unable to have sex (impotence). °· You have numbness in your arms or legs or you have a hard time moving them. °· You have a hard time talking. °· You have a fever or lasting symptoms for more than 2-3 days. °· You have a fever and your symptoms suddenly get  worse. °· You have signs or symptoms of infection. These include: °? Chills. °? Being more tired than normal (lethargy). °? Irritability. °? Poor eating. °? Throwing up (vomiting). °· You have pain that is not helped with medicine. °· You have shortness of breath. °· You have pain in your chest. °· You are coughing up pus-like or bloody mucus. °· You have a stiff neck. °· Your feet or hands swell or have pain. °· Your belly looks bloated. °· Your joints hurt. °This information is not intended to replace advice given to you by your health care provider. Make sure you discuss any questions you have with your health care provider. °Document Released: 08/08/2013 Document Revised: 03/25/2016 Document Reviewed: 05/30/2013 °Elsevier Interactive Patient Education © 2017 [employer] ° °

## 2018-03-26 LAB — CANNABINOID (GC/MS), URINE
CANNABINOID UR: POSITIVE — AB
Carboxy THC (GC/MS): >400 ng/mL

## 2018-03-26 LAB — 737588 9+OXYCODONE+CRT-UNBUND
Amphetamine Scrn, Ur: NEGATIVE ng/mL
BARBITURATE SCREEN URINE: NEGATIVE ng/mL
BENZODIAZEPINE SCREEN, URINE: NEGATIVE ng/mL
CREATININE(CRT), U: 123.6 mg/dL (ref 20.0–300.0)
Cocaine (Metab) Scrn, Ur: NEGATIVE ng/mL
Methadone Screen, Urine: NEGATIVE ng/mL
PH UR, DRUG SCRN: 6.5 (ref 4.5–8.9)
PHENCYCLIDINE QUANTITATIVE URINE: NEGATIVE ng/mL
Propoxyphene Scrn, Ur: NEGATIVE ng/mL

## 2018-03-26 LAB — OPIATES CONFIRMATION, URINE
CODEINE: NEGATIVE
Hydrocodone: NEGATIVE
Hydromorphone Confirm: 420 ng/mL
Hydromorphone: POSITIVE — AB
MORPHINE CONFIRM: >3000 ng/mL
Morphine: POSITIVE — AB
Opiates: POSITIVE ng/mL — AB

## 2018-03-26 LAB — OXYCODONE/OXYMORPHONE CONFIRM
OXYCODONE/OXYMORPH: POSITIVE — AB
OXYCODONE: 401 ng/mL
OXYCODONE: POSITIVE — AB
OXYMORPHONE CONFIRM: 293 ng/mL
OXYMORPHONE: POSITIVE — AB

## 2018-04-11 ENCOUNTER — Telehealth: Payer: Self-pay

## 2018-04-13 ENCOUNTER — Telehealth: Payer: Self-pay

## 2018-04-13 ENCOUNTER — Other Ambulatory Visit: Payer: Self-pay | Admitting: Family Medicine

## 2018-04-13 DIAGNOSIS — Z79891 Long term (current) use of opiate analgesic: Secondary | ICD-10-CM

## 2018-04-13 DIAGNOSIS — D571 Sickle-cell disease without crisis: Secondary | ICD-10-CM

## 2018-04-13 MED ORDER — OXYCODONE HCL 10 MG PO TABS: 10.0000 mg | ORAL_TABLET | Freq: Four times a day (QID) | ORAL | 0 refills | Status: DC | PRN

## 2018-04-13 NOTE — Progress Notes (Signed)
Reviewed [place] Substance Reporting system prior to prescribing opiate medications. No inconsistencies noted.   Meds ordered this encounter  Medications  . DISCONTD: Oxycodone HCl 10 MG TABS    Sig: Take 1 tablet (10 mg total) by mouth every 6 (six) hours as needed (PAIN).    Dispense:  90 tablet    Refill:  0    Order Specific Question:   Supervising Provider    Answer:   Tresa Garter [id number]  . Oxycodone HCl 10 MG TABS    Sig: Take 1 tablet (10 mg total) by mouth every 6 (six) hours as needed (PAIN).    Dispense:  90 tablet    Refill:  0    Order Specific Question:   Supervising Provider    Answer:   Tresa Garter [[id number]]     Carmen Pounds  Carmen [name], Carmen [name] Carmen [name] [name] [name], [name] [address] [phone]

## 2018-04-15 ENCOUNTER — Other Ambulatory Visit: Payer: Self-pay | Admitting: Family Medicine

## 2018-04-15 DIAGNOSIS — D571 Sickle-cell disease without crisis: Secondary | ICD-10-CM

## 2018-04-17 ENCOUNTER — Telehealth: Payer: Self-pay

## 2018-04-17 ENCOUNTER — Other Ambulatory Visit: Payer: Self-pay | Admitting: Family Medicine

## 2018-04-17 DIAGNOSIS — Z79891 Long term (current) use of opiate analgesic: Secondary | ICD-10-CM

## 2018-04-17 DIAGNOSIS — D571 Sickle-cell disease without crisis: Secondary | ICD-10-CM

## 2018-04-17 MED ORDER — MORPHINE SULFATE ER 60 MG PO TBCR: 60.0000 mg | EXTENDED_RELEASE_TABLET | Freq: Two times a day (BID) | ORAL | 0 refills | Status: DC

## 2018-04-17 NOTE — Progress Notes (Signed)
Reviewed [place] Substance Reporting system prior to prescribing opiate medications. No inconsistencies noted.   Meds ordered this encounter  Medications  . morphine (MS CONTIN) 60 MG 12 hr tablet    Sig: Take 1 tablet (60 mg total) by mouth every 12 (twelve) hours.    Dispense:  60 tablet    Refill:  0    Rx not to be filled prior to 04/20/2018    Order Specific Question:   Supervising Provider    Answer:   Tresa Garter [[id number]]    [name]  MSN, FNP-C Patient [facility] [address] [place], [place] [address] [phone]

## 2018-04-18 ENCOUNTER — Emergency Department (HOSPITAL_COMMUNITY)
Admission: EM | Admit: 2018-04-18 | Discharge: 2018-04-18 | Disposition: A | Discharge status: Home / Self Care | Payer: Medicaid Other | Attending: Emergency Medicine | Admitting: Emergency Medicine

## 2018-04-18 ENCOUNTER — Other Ambulatory Visit: Payer: Self-pay

## 2018-04-18 ENCOUNTER — Encounter (HOSPITAL_COMMUNITY): Payer: Self-pay | Admitting: Emergency Medicine

## 2018-04-18 DIAGNOSIS — D5700 Hb-SS disease with crisis, unspecified: Secondary | ICD-10-CM | POA: Diagnosis not present

## 2018-04-18 DIAGNOSIS — Z79899 Other long term (current) drug therapy: Secondary | ICD-10-CM | POA: Diagnosis not present

## 2018-04-18 DIAGNOSIS — Z7982 Long term (current) use of aspirin: Secondary | ICD-10-CM | POA: Diagnosis not present

## 2018-04-18 DIAGNOSIS — M549 Dorsalgia, unspecified: Secondary | ICD-10-CM | POA: Diagnosis present

## 2018-04-18 LAB — RETICULOCYTES
RBC.: 3.24 MIL/uL — AB (ref 3.87–5.11)
Retic Count, Absolute: 233.3 10*3/uL — ABNORMAL HIGH (ref 19.0–186.0)
Retic Ct Pct: 7.2 % — ABNORMAL HIGH (ref 0.4–3.1)

## 2018-04-18 LAB — I-STAT BETA HCG BLOOD, ED (MC, WL, AP ONLY): I-stat hCG, quantitative: <5 m[IU]/mL (ref <5)

## 2018-04-18 LAB — CBC WITH DIFFERENTIAL/PLATELET
BASOS PCT: 0 %
Basophils Absolute: 0 10*3/uL (ref 0.0–0.1)
EOS ABS: 0.2 10*3/uL (ref 0.0–0.7)
Eosinophils Relative: 2 %
HEMATOCRIT: 27 % — AB (ref 36.0–46.0)
Hemoglobin: 9.5 g/dL — ABNORMAL LOW (ref 12.0–15.0)
LYMPHS ABS: 4.6 10*3/uL — AB (ref 0.7–4.0)
Lymphocytes Relative: 50 %
MCH: 29.3 pg (ref 26.0–34.0)
MCHC: 35.2 g/dL (ref 30.0–36.0)
MCV: 83.3 fL (ref 78.0–100.0)
MONOS PCT: 11 %
Monocytes Absolute: 1 10*3/uL (ref 0.1–1.0)
NEUTROS ABS: 3.4 10*3/uL (ref 1.7–7.7)
Neutrophils Relative %: 37 %
Platelets: 593 10*3/uL — ABNORMAL HIGH (ref 150–400)
RBC: 3.24 MIL/uL — ABNORMAL LOW (ref 3.87–5.11)
RDW: 16.4 % — AB (ref 11.5–15.5)
WBC: 9.2 10*3/uL (ref 4.0–10.5)

## 2018-04-18 LAB — URINALYSIS, ROUTINE W REFLEX MICROSCOPIC
BILIRUBIN URINE: NEGATIVE
GLUCOSE, UA: NEGATIVE mg/dL
HGB URINE DIPSTICK: NEGATIVE
KETONES UR: NEGATIVE mg/dL
Nitrite: NEGATIVE
PROTEIN: NEGATIVE mg/dL
Specific Gravity, Urine: 1.011 (ref 1.005–1.030)
pH: 7 (ref 5.0–8.0)

## 2018-04-18 LAB — COMPREHENSIVE METABOLIC PANEL
ALBUMIN: 3.9 g/dL (ref 3.5–5.0)
ALK PHOS: 56 U/L (ref 38–126)
ALT: 15 U/L (ref 14–54)
AST: 27 U/L (ref 15–41)
Anion gap: 6 (ref 5–15)
BUN: 14 mg/dL (ref 6–20)
CALCIUM: 9.2 mg/dL (ref 8.9–10.3)
CO2: 25 mmol/L (ref 22–32)
CREATININE: 0.73 mg/dL (ref 0.44–1.00)
Chloride: 112 mmol/L — ABNORMAL HIGH (ref 101–111)
GFR calc Af Amer: >60 mL/min (ref >60)
GFR calc non Af Amer: >60 mL/min (ref >60)
GLUCOSE: 101 mg/dL — AB (ref 65–99)
Potassium: 4 mmol/L (ref 3.5–5.1)
SODIUM: 143 mmol/L (ref 135–145)
Total Bilirubin: 1.1 mg/dL (ref 0.3–1.2)
Total Protein: 7.5 g/dL (ref 6.5–8.1)

## 2018-04-18 MED ORDER — HYDROMORPHONE HCL 2 MG/ML IJ SOLN: 2.0000 mg | INTRAMUSCULAR | Status: AC

## 2018-04-18 MED ORDER — HYDROMORPHONE HCL 2 MG/ML IJ SOLN
2.0000 mg | INTRAMUSCULAR | Status: AC
  Administered 2018-04-18: 2 mg via INTRAVENOUS
  Filled 2018-04-18: qty 1

## 2018-04-18 MED ORDER — KETOROLAC TROMETHAMINE 30 MG/ML IJ SOLN
30.0000 mg | INTRAMUSCULAR | Status: AC
  Administered 2018-04-18: 30 mg via INTRAVENOUS
  Filled 2018-04-18: qty 1

## 2018-04-18 MED ORDER — ONDANSETRON HCL 4 MG/2ML IJ SOLN
4.0000 mg | INTRAMUSCULAR | Status: DC | PRN
  Administered 2018-04-18: 4 mg via INTRAVENOUS
  Filled 2018-04-18: qty 2

## 2018-04-18 MED ORDER — DEXTROSE-NACL 5-0.45 % IV SOLN
INTRAVENOUS | Status: DC
  Administered 2018-04-18: 03:00:00 via INTRAVENOUS

## 2018-04-18 NOTE — ED Provider Notes (Signed)
[facility] DEPT Provider Note   CSN: [id number] Arrival date & time: 04/18/18  0000     History   Chief Complaint Chief Complaint  Patient presents with  . Back Pain  . Sickle Cell Pain Crisis    HPI Carmen [name] is a 27 y.o. female.  Patient presents with typical sickle cell pain involving her entire back.  States is been ongoing for the past 2 or 3 days.  She ran out of her extended release morphine 1 day ago.  She has been taking her oxycodone with partial relief.  Denies fever, chills, nausea or vomiting.  Denies any change in her chronic sickle cell pain pattern.  No chest pain or shortness of breath.  No pain with urination or blood in the urine.  No focal weakness, numbness or tingling in her arms or legs.  States she has not been admitted for sickle cell crisis for several years.  The history is provided by the patient.  Back Pain   Pertinent negatives include no chest pain, no fever, no headaches, no abdominal pain, no dysuria and no weakness.  Sickle Cell Pain Crisis  Associated symptoms: no chest pain, no congestion, no cough, no fever, no headaches, no nausea, no shortness of breath and no vomiting     Past Medical History:  Diagnosis Date  . Abortion    05/2012  . Headache(784.0)   . Sickle cell crisis [facility])     Patient Active Problem List   Diagnosis Date Noted  . Vitamin D deficiency 01/13/2018  . 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 Anemia of chronic disease   . S/P laparoscopic cholecystectomy 11/30/2014  . Thrombocytosis ([place]) 11/22/2014  . Systolic ejection murmur 13/06/6577    Past Surgical History:  Procedure Laterality Date  . CHOLECYSTECTOMY N/A 11/30/2014   Procedure: LAPAROSCOPIC CHOLECYSTECTOMY SINGLE SITE WITH INTRAOPERATIVE CHOLANGIOGRAM;  Surgeon: Michael Boston, MD;  Location: [facility] ORS;  Service: General;  Laterality: N/A;  . SPLENECTOMY        OB History    Gravida  1   Para      Term      Preterm      AB  1   Living        SAB      TAB      Ectopic      Multiple      Live Births               Home Medications    Prior to Admission medications   Medication Sig Start Date End Date Taking? Authorizing Provider  aspirin EC 81 MG tablet Take 1 tablet (81 mg total) by mouth daily. 05/31/17  Yes Leana Gamer, MD  ergocalciferol (VITAMIN D2) 50000 units capsule Take 1 capsule (50,000 Units total) by mouth once a week. 01/13/18  Yes [name], FNP  folic acid (FOLVITE) 1 MG tablet Take 1 tablet (1 mg total) by mouth daily. 01/16/18 01/16/19 Yes [name], FNP  hydroxyurea (HYDREA) 500 MG capsule TAKE 2 CAPSULES BY MOUTH DAILY. MAY TAKE WITH FOOD TO MINIMIZE GI SIDE EFFECTS. 04/17/18  Yes [name], FNP  morphine (MS CONTIN) 60 MG 12 hr tablet Take 1 tablet (60 mg total) by mouth every 12 (twelve) hours. 04/20/18  Yes [name], FNP  Oxycodone HCl 10 MG TABS Take 1 tablet (10 mg total) by  mouth every 6 (six) hours as needed (PAIN). 04/13/18  Yes [name], FNP    Family History Family History  Problem Relation Age of Onset  . Hypertension Mother   . Sickle cell anemia Sister   . Kidney disease Sister        Lupus  . Arthritis Sister   . Sickle cell anemia Sister   . Sickle cell trait Sister   . Heart disease Maternal Aunt        CABG  . Heart disease Maternal Uncle        CABG  . Lupus Sister     Social History Social History   Tobacco Use  . Smoking status: Never Smoker  . Smokeless tobacco: Never Used  Substance Use Topics  . Alcohol use: No  . Drug use: No     Allergies   Patient has no known allergies.   Review of Systems Review of Systems  Constitutional: Negative for activity change, appetite change and fever.  HENT: Negative for congestion and rhinorrhea.   Eyes: Negative for visual disturbance.  Respiratory: Negative for cough,  chest tightness and shortness of breath.   Cardiovascular: Negative for chest pain and leg swelling.  Gastrointestinal: Negative for abdominal pain, nausea and vomiting.  Genitourinary: Negative for dysuria and hematuria.  Musculoskeletal: Positive for arthralgias, back pain and myalgias.  Neurological: Negative for dizziness, weakness and headaches.    all other systems are negative except as noted in the HPI and PMH.    Physical Exam Updated Vital Signs BP 110/72 (BP Location: Left Arm)   Pulse 87   Temp 98.2 F (36.8 C) (Oral)   Resp 20   Ht 5\' 3"  (1.6 m)   Wt 77.1 kg (170 lb)   SpO2 96%   BMI 30.11 kg/m   Physical Exam  Constitutional: She is oriented to person, place, and time. She appears well-developed and well-nourished. No distress.  Well appearing  HENT:  Head: Normocephalic and atraumatic.  Mouth/Throat: Oropharynx is clear and moist. No oropharyngeal exudate.  Eyes: Pupils are equal, round, and reactive to light. Conjunctivae and EOM are normal.  Neck: Normal range of motion. Neck supple.  No meningismus.  Cardiovascular: Normal rate, regular rhythm, normal heart sounds and intact distal pulses.  No murmur heard. Pulmonary/Chest: Effort normal and breath sounds normal. No respiratory distress.  Abdominal: Soft. There is no tenderness. There is no rebound and no guarding.  Musculoskeletal: Normal range of motion. She exhibits tenderness. She exhibits no edema.  Diffuse paraspinal tenderness throughout thoracic and lumbar spine, no midline tenderness  FROM of all major joints without erythema or edema.  Neurological: She is alert and oriented to person, place, and time. No cranial nerve deficit. She exhibits normal muscle tone. Coordination normal.  No ataxia on finger to nose bilaterally. No pronator drift. 5/5 strength throughout. CN 2-12 intact.Equal grip strength. Sensation intact.   Skin: Skin is warm.  Psychiatric: She has a normal mood and affect. Her  behavior is normal.  Nursing note and vitals reviewed.    ED Treatments / Results  Labs (all labs ordered are listed, but only abnormal results are displayed) Labs Reviewed  COMPREHENSIVE METABOLIC PANEL - Abnormal; Notable for the following components:      Result Value   Chloride 112 (*)    Glucose, Bld 101 (*)    All other components within normal limits  CBC WITH DIFFERENTIAL/PLATELET - Abnormal; Notable for the following components:   RBC 3.24 (*)  Hemoglobin 9.5 (*)    HCT 27.0 (*)    RDW 16.4 (*)    Platelets 593 (*)    Lymphs Abs 4.6 (*)    All other components within normal limits  RETICULOCYTES - Abnormal; Notable for the following components:   Retic Ct Pct 7.2 (*)    RBC. 3.24 (*)    Retic Count, Absolute 233.3 (*)    All other components within normal limits  URINALYSIS, ROUTINE W REFLEX MICROSCOPIC - Abnormal; Notable for the following components:   Leukocytes, UA MODERATE (*)    Bacteria, UA RARE (*)    All other components within normal limits  URINE CULTURE  I-STAT BETA HCG BLOOD, ED ([facility], [facility], [facility] ONLY)    EKG None  Radiology No results found.  Procedures Procedures (including critical care time)  Medications Ordered in ED Medications  dextrose 5 %-0.45 % sodium chloride infusion (has no administration in time range)  ketorolac (TORADOL) 30 MG/ML injection 30 mg (has no administration in time range)  HYDROmorphone (DILAUDID) injection 2 mg (has no administration in time range)    Or  HYDROmorphone (DILAUDID) injection 2 mg (has no administration in time range)  HYDROmorphone (DILAUDID) injection 2 mg (has no administration in time range)    Or  HYDROmorphone (DILAUDID) injection 2 mg (has no administration in time range)  HYDROmorphone (DILAUDID) injection 2 mg (has no administration in time range)    Or  HYDROmorphone (DILAUDID) injection 2 mg (has no administration in time range)  ondansetron (ZOFRAN) injection 4 mg (has no administration in  time range)     Initial Impression / Assessment and Plan / ED Course  I have reviewed the triage vital signs and the nursing notes.  Pertinent labs & imaging results that were available during my care of the patient were reviewed by me and considered in my medical decision making (see chart for details).    Typical sickle cell pain.  No fever no chest pain or shortness of breath.  Patient will be hydrated and treated with pain medication and Toradol protocol.  Hemoglobin is stable.  Reticulocyte count is adequate.  Urinalysis is negative for infection.  Culture sent.  Patient feels improved after multiple doses of IV narcotics.  She is resting comfortably in no distress.  Vitals are stable.  No fever. She feels that she is comfortable going home.  Discussed with patient that the ED cannot provide her chronic pain medication prescriptions.  Follow-up with your doctor.  Return precautions discussed.  Final Clinical Impressions(s) / ED Diagnoses   Final diagnoses:  Sickle cell pain crisis [facility])    ED Discharge Orders    None       Kali Deadwyler, Annie Main, MD 04/18/18 [id number]

## 2018-04-18 NOTE — ED Notes (Signed)
Refused urine again

## 2018-04-18 NOTE — ED Triage Notes (Signed)
Pt reports having back pain for the last week that is similar to sickle cell pain.

## 2018-04-18 NOTE — Discharge Instructions (Addendum)
Keep yourself hydrated.  Follow-up with your primary care doctor.  Take your pain medication as prescribed.  Return to the ED if you develop new or worsening symptoms.

## 2018-04-19 ENCOUNTER — Encounter (HOSPITAL_COMMUNITY): Payer: Self-pay | Admitting: *Deleted

## 2018-04-19 ENCOUNTER — Emergency Department (HOSPITAL_COMMUNITY)
Admission: EM | Admit: 2018-04-19 | Discharge: 2018-04-19 | Disposition: A | Discharge status: Home / Self Care | Payer: Medicaid Other | Attending: Emergency Medicine | Admitting: Emergency Medicine

## 2018-04-19 DIAGNOSIS — Z79899 Other long term (current) drug therapy: Secondary | ICD-10-CM | POA: Insufficient documentation

## 2018-04-19 DIAGNOSIS — Z7982 Long term (current) use of aspirin: Secondary | ICD-10-CM | POA: Insufficient documentation

## 2018-04-19 DIAGNOSIS — D5700 Hb-SS disease with crisis, unspecified: Secondary | ICD-10-CM | POA: Insufficient documentation

## 2018-04-19 DIAGNOSIS — M549 Dorsalgia, unspecified: Secondary | ICD-10-CM | POA: Diagnosis present

## 2018-04-19 LAB — CBC WITH DIFFERENTIAL/PLATELET
ABS IMMATURE GRANULOCYTES: 0 10*3/uL (ref 0.0–0.1)
Basophils Absolute: 0 10*3/uL (ref 0.0–0.1)
Basophils Relative: 0 %
EOS PCT: 3 %
Eosinophils Absolute: 0.2 10*3/uL (ref 0.0–0.7)
HEMATOCRIT: 26.4 % — AB (ref 36.0–46.0)
HEMOGLOBIN: 9.2 g/dL — AB (ref 12.0–15.0)
Immature Granulocytes: 0 %
LYMPHS ABS: 3 10*3/uL (ref 0.7–4.0)
LYMPHS PCT: 34 %
MCH: 28.8 pg (ref 26.0–34.0)
MCHC: 34.8 g/dL (ref 30.0–36.0)
MCV: 82.8 fL (ref 78.0–100.0)
MONO ABS: 1.1 10*3/uL — AB (ref 0.1–1.0)
MONOS PCT: 12 %
NEUTROS ABS: 4.3 10*3/uL (ref 1.7–7.7)
Neutrophils Relative %: 51 %
Platelets: 513 10*3/uL — ABNORMAL HIGH (ref 150–400)
RBC: 3.19 MIL/uL — ABNORMAL LOW (ref 3.87–5.11)
RDW: 15.1 % (ref 11.5–15.5)
WBC: 8.7 10*3/uL (ref 4.0–10.5)

## 2018-04-19 LAB — URINE CULTURE

## 2018-04-19 MED ORDER — KETOROLAC TROMETHAMINE 15 MG/ML IJ SOLN
15.0000 mg | INTRAMUSCULAR | Status: AC
  Administered 2018-04-19: 15 mg via INTRAVENOUS
  Filled 2018-04-19: qty 1

## 2018-04-19 MED ORDER — DIPHENHYDRAMINE HCL 25 MG PO CAPS: 25.0000 mg | ORAL_CAPSULE | ORAL | Status: DC | PRN

## 2018-04-19 MED ORDER — HYDROMORPHONE HCL 2 MG/ML IJ SOLN: 2.0000 mg | INTRAMUSCULAR | Status: AC

## 2018-04-19 MED ORDER — HYDROMORPHONE HCL 2 MG/ML IJ SOLN
2.0000 mg | INTRAMUSCULAR | Status: AC
  Administered 2018-04-19: 2 mg via INTRAVENOUS
  Filled 2018-04-19: qty 1

## 2018-04-19 MED ORDER — HYDROMORPHONE HCL 2 MG/ML IJ SOLN
2.0000 mg | INTRAMUSCULAR | Status: AC
  Administered 2018-04-19: 2 mg via INTRAVENOUS

## 2018-04-19 MED ORDER — HYDROMORPHONE HCL 2 MG/ML IJ SOLN
2.0000 mg | INTRAMUSCULAR | Status: AC
  Administered 2018-04-19 (×2): 2 mg via INTRAVENOUS
  Filled 2018-04-19 (×2): qty 1

## 2018-04-19 MED ORDER — HYDROMORPHONE HCL 2 MG/ML IJ SOLN
2.0000 mg | INTRAMUSCULAR | Status: AC
  Filled 2018-04-19: qty 1

## 2018-04-19 MED ORDER — ONDANSETRON HCL 4 MG/2ML IJ SOLN: 4.0000 mg | INTRAMUSCULAR | Status: DC | PRN

## 2018-04-19 NOTE — ED Provider Notes (Signed)
[facility] EMERGENCY DEPARTMENT Provider Note   CSN: [id number] Arrival date & time: 04/19/18  0002     History   Chief Complaint Chief Complaint  Patient presents with  . Sickle Cell Pain Crisis    HPI Carmen [name] is a 27 y.o. female.  The history is provided by the patient.  Sickle Cell Pain Crisis  Location:  Back Severity:  Severe Onset quality:  Gradual Duration:  3 days Timing:  Constant Progression:  Worsening Chronicity:  Recurrent Relieved by:  Nothing Worsened by:  Movement Associated symptoms: no chest pain, no cough, no fever, no shortness of breath and no vomiting   Presents for concern for sickle cell pain crisis.  She reports the pain is diffuse to her back.  No fever/vomiting/chest pain/shortness of breath.  No abdominal pain. She was seen in the [facility] emergency department about 24 hours ago for similar symptoms, felt improved at home.  However the pain is been returning.  This is similar to prior episodes in the past  Past Medical History:  Diagnosis Date  . Abortion    05/2012  . Headache(784.0)   . Sickle cell crisis [facility])     Patient Active Problem List   Diagnosis Date Noted  . Vitamin D deficiency 01/13/2018  . 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 Anemia of chronic disease   . S/P laparoscopic cholecystectomy 11/30/2014  . Thrombocytosis ([place]) 11/22/2014  . Systolic ejection murmur 65/46/5035    Past Surgical History:  Procedure Laterality Date  . CHOLECYSTECTOMY N/A 11/30/2014   Procedure: LAPAROSCOPIC CHOLECYSTECTOMY SINGLE SITE WITH INTRAOPERATIVE CHOLANGIOGRAM;  Surgeon: Michael Boston, MD;  Location: [facility] ORS;  Service: General;  Laterality: N/A;  . SPLENECTOMY       OB History    Gravida  1   Para      Term      Preterm      AB  1   Living        SAB      TAB      Ectopic      Multiple      Live Births                Home Medications    Prior to Admission medications   Medication Sig Start Date End Date Taking? Authorizing Provider  aspirin EC 81 MG tablet Take 1 tablet (81 mg total) by mouth daily. 05/31/17  Yes Leana Gamer, MD  ergocalciferol (VITAMIN D2) 50000 units capsule Take 1 capsule (50,000 Units total) by mouth once a week. 01/13/18  Yes [name], FNP  folic acid (FOLVITE) 1 MG tablet Take 1 tablet (1 mg total) by mouth daily. 01/16/18 01/16/19 Yes [name], FNP  hydroxyurea (HYDREA) 500 MG capsule TAKE 2 CAPSULES BY MOUTH DAILY. MAY TAKE WITH FOOD TO MINIMIZE GI SIDE EFFECTS. 04/17/18  Yes [name], FNP  morphine (MS CONTIN) 60 MG 12 hr tablet Take 1 tablet (60 mg total) by mouth every 12 (twelve) hours. 04/20/18  Yes [name], FNP  Oxycodone HCl 10 MG TABS Take 1 tablet (10 mg total) by mouth every 6 (six) hours as needed (PAIN). 04/13/18  Yes [name], FNP    Family History Family History  Problem Relation Age of Onset  . Hypertension Mother   . Sickle cell anemia Sister   . Kidney disease Sister  Lupus  . Arthritis Sister   . Sickle cell anemia Sister   . Sickle cell trait Sister   . Heart disease Maternal Aunt        CABG  . Heart disease Maternal Uncle        CABG  . Lupus Sister     Social History Social History   Tobacco Use  . Smoking status: Never Smoker  . Smokeless tobacco: Never Used  Substance Use Topics  . Alcohol use: No  . Drug use: No     Allergies   Patient has no known allergies.   Review of Systems Review of Systems  Constitutional: Negative for fever.  Respiratory: Negative for cough and shortness of breath.   Cardiovascular: Negative for chest pain.  Gastrointestinal: Negative for vomiting.  Musculoskeletal: Positive for back pain.  Neurological: Negative for weakness.  All other systems reviewed and are negative.    Physical Exam Updated Vital Signs BP 131/75   Pulse 80    Temp 98.5 F (36.9 C) (Oral)   Resp 14   Ht 1.6 m (5\' 3" )   Wt 77.1 kg (170 lb)   SpO2 98%   BMI 30.11 kg/m   Physical Exam CONSTITUTIONAL: Well developed/well nourished HEAD: Normocephalic/atraumatic EYES: EOMI/PERRL ENMT: Mucous membranes moist NECK: supple no meningeal signs SPINE/BACK:entire spine nontender, diffuse tenderness to back, no bruising/crepitance/stepoffs noted to spine CV: S1/S2 noted, no murmurs/rubs/gallops noted LUNGS: Lungs are clear to auscultation bilaterally, no apparent distress ABDOMEN: soft, nontender, no rebound or guarding, bowel sounds noted throughout abdomen GU:no cva tenderness NEURO: Pt is awake/alert/appropriate, moves all extremitiesx4.  No facial droop.   EXTREMITIES: pulses normal/equal, full ROM SKIN: warm, color normal PSYCH: no abnormalities of mood noted, alert and oriented to situation   ED Treatments / Results  Labs (all labs ordered are listed, but only abnormal results are displayed) Labs Reviewed  CBC WITH DIFFERENTIAL/PLATELET - Abnormal; Notable for the following components:      Result Value   RBC 3.19 (*)    Hemoglobin 9.2 (*)    HCT 26.4 (*)    Platelets 513 (*)    Monocytes Absolute 1.1 (*)    All other components within normal limits    EKG None  Radiology No results found.  Procedures Procedures (including critical care time)  Medications Ordered in ED Medications  HYDROmorphone (DILAUDID) injection 2 mg (2 mg Intravenous Not Given 04/19/18 0542)    Or  HYDROmorphone (DILAUDID) injection 2 mg ( Subcutaneous See Alternative 04/19/18 0542)  diphenhydrAMINE (BENADRYL) capsule 25-50 mg (has no administration in time range)  ondansetron (ZOFRAN) injection 4 mg (has no administration in time range)  ketorolac (TORADOL) 15 MG/ML injection 15 mg (15 mg Intravenous Given 04/19/18 0346)  HYDROmorphone (DILAUDID) injection 2 mg (2 mg Intravenous Given 04/19/18 0346)    Or  HYDROmorphone (DILAUDID) injection 2 mg (  Subcutaneous See Alternative 04/19/18 0346)  HYDROmorphone (DILAUDID) injection 2 mg (2 mg Intravenous Given 04/19/18 0431)    Or  HYDROmorphone (DILAUDID) injection 2 mg ( Subcutaneous See Alternative 04/19/18 0431)  HYDROmorphone (DILAUDID) injection 2 mg (2 mg Intravenous Given 04/19/18 0624)    Or  HYDROmorphone (DILAUDID) injection 2 mg ( Subcutaneous See Alternative 04/19/18 3785)     Initial Impression / Assessment and Plan / ED Course  I have reviewed the triage vital signs and the nursing notes.  Pertinent labs results that were available during my care of the patient were reviewed by me and considered in  my medical decision making (see chart for details).     3:25 AM Patient here for repeat visit for sickle cell pain crisis.  Will treat pain and reassess.  Labs from previous visit reviewed and unremarkable, but will repeat CBC. 7:10 AM CBC unchanged.  Patient feels improved.  She is requesting discharge.  Advise follow-up with the sickle cell clinic Final Clinical Impressions(s) / ED Diagnoses   Final diagnoses:  Sickle cell pain crisis [facility])    ED Discharge Orders    None       Ripley Fraise, MD 04/19/18 [phone]

## 2018-04-19 NOTE — ED Notes (Signed)
Missed IV x2. Will get another RN to attempt ultrasound.

## 2018-04-19 NOTE — ED Triage Notes (Signed)
To ED for eval of back pain. States she was released this am from [facility] ED for same. Went home and took pain meds without relief. Pt was given fluid bolus while at [facility]. States her last blood transfusion was 4-5 yrs ago. Pain in back is constant. Last oral pain medication was 7pm.

## 2018-04-20 ENCOUNTER — Ambulatory Visit: Payer: Medicaid Other | Admitting: Family Medicine

## 2018-04-20 ENCOUNTER — Telehealth: Payer: Self-pay | Admitting: *Deleted

## 2018-04-20 NOTE — Telephone Encounter (Signed)
Post ED Visit - Positive Culture Follow-up  Culture report reviewed by antimicrobial stewardship pharmacist:  []  Elenor Quinones, Pharm.D. []  Heide Guile, Pharm.D., BCPS AQ-ID []  Parks Neptune, Pharm.D., BCPS []  Alycia Rossetti, Pharm.D., BCPS []  [place], [place].D., BCPS, AAHIVP []  Legrand Como, Pharm.D., BCPS, AAHIVP []  Salome Arnt, PharmD, BCPS []  Wynell Balloon, PharmD []  Vincenza Hews, PharmD, BCPS Angus Seller, PharmD  Positive urine culture Patient with no urinary symptoms and no further patient follow-up is required at this time.  Harlon Flor [facility] 04/20/2018, 10:09 AM

## 2018-04-20 NOTE — Progress Notes (Signed)
ED Antimicrobial Stewardship Positive Culture Follow Up   AJEE HEASLEY is an 27 y.o. female who presented to [facility] on 04/19/2018 with a chief complaint of  Chief Complaint  Patient presents with  . Sickle Cell Pain Crisis    Recent Results (from the past 720 hour(s))  Urine Culture     Status: Abnormal   Collection Time: 04/18/18  3:22 AM  Result Value Ref Range Status   Specimen Description   Final    URINE, CLEAN CATCH Performed at [facility], [place] [address] [place], [place] [address]    Special Requests   Final    NONE Performed at [facility], [place] [address] [place], [place] [address]    Culture (A)  Final    >=100,000 COLONIES/mL CORYNEBACTERIUM SPECIES Standardized susceptibility testing for this organism is not available. Performed at [facility], [place] [address], [place], [place] [address]    Report Status 04/19/2018 FINAL  Final    27 year old female presented with sickle cell pain. No complaints of dysuria or hematuria. WBC wnl. UA negative except for moderate leukocytes. Agree with no treatment.  ED Provider: Franchot Heidelberg, PA-C   Derisha Funderburke A Nakeesha Bowler 04/20/2018, 8:52 AM Infectious Diseases Pharmacist Phone# [phone]

## 2018-04-21 ENCOUNTER — Ambulatory Visit (INDEPENDENT_AMBULATORY_CARE_PROVIDER_SITE_OTHER): Payer: Medicaid Other | Admitting: Family Medicine

## 2018-04-21 ENCOUNTER — Encounter: Payer: Self-pay | Admitting: Family Medicine

## 2018-04-21 VITALS — BP 124/62 | HR 71 | Temp 98.2°F | Resp 16 | Ht 63.0 in | Wt 158.0 lb

## 2018-04-21 DIAGNOSIS — D571 Sickle-cell disease without crisis: Secondary | ICD-10-CM | POA: Diagnosis not present

## 2018-04-21 DIAGNOSIS — R8290 Unspecified abnormal findings in urine: Secondary | ICD-10-CM

## 2018-04-21 DIAGNOSIS — Z79891 Long term (current) use of opiate analgesic: Secondary | ICD-10-CM | POA: Diagnosis not present

## 2018-04-21 LAB — POCT URINALYSIS DIPSTICK
Bilirubin, UA: NEGATIVE
GLUCOSE UA: NEGATIVE
Ketones, UA: NEGATIVE
Nitrite, UA: NEGATIVE
Protein, UA: NEGATIVE
Spec Grav, UA: 1.01 (ref 1.010–1.025)
Urobilinogen, UA: 1 E.U./dL
pH, UA: 5.5 (ref 5.0–8.0)

## 2018-04-21 NOTE — Patient Instructions (Signed)
Sickle Cell Anemia, Adult °Sickle cell anemia is a condition where your red blood cells are shaped like sickles. Red blood cells carry oxygen through the body. Sickle-shaped red blood cells do not live as long as normal red blood cells. They also clump together and block blood from flowing through the blood vessels. These things prevent the body from getting enough oxygen. Sickle cell anemia causes organ damage and pain. It also increases the risk of infection. °Follow these instructions at home: °· Drink enough fluid to keep your pee (urine) clear or pale yellow. Drink more in hot weather and during exercise. °· Do not smoke. Smoking lowers oxygen levels in the blood. °· Only take over-the-counter or prescription medicines as told by your doctor. °· Take antibiotic medicines as told by your doctor. Make sure you finish them even if you start to feel better. °· Take supplements as told by your doctor. °· Consider wearing a medical alert bracelet. This tells anyone caring for you in an emergency of your condition. °· When traveling, keep your medical information, doctors' names, and the medicines you take with you at all times. °· If you have a fever, do not take fever medicines right away. This could cover up a problem. Tell your doctor. °· Keep all follow-up visits with your doctor. Sickle cell anemia requires regular medical care. °Contact a doctor if: °You have a fever. °Get help right away if: °· You feel dizzy or faint. °· You have new belly (abdominal) pain, especially on the left side near the stomach area. °· You have a lasting, often uncomfortable and painful erection of the penis (priapism). If it is not treated right away, you will become unable to have sex (impotence). °· You have numbness in your arms or legs or you have a hard time moving them. °· You have a hard time talking. °· You have a fever or lasting symptoms for more than 2-3 days. °· You have a fever and your symptoms suddenly get  worse. °· You have signs or symptoms of infection. These include: °? Chills. °? Being more tired than normal (lethargy). °? Irritability. °? Poor eating. °? Throwing up (vomiting). °· You have pain that is not helped with medicine. °· You have shortness of breath. °· You have pain in your chest. °· You are coughing up pus-like or bloody mucus. °· You have a stiff neck. °· Your feet or hands swell or have pain. °· Your belly looks bloated. °· Your joints hurt. °This information is not intended to replace advice given to you by your health care provider. Make sure you discuss any questions you have with your health care provider. °Document Released: 08/08/2013 Document Revised: 03/25/2016 Document Reviewed: 05/30/2013 °Elsevier Interactive Patient Education © 2017 [employer] ° °
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
## 2018-04-22 LAB — MED LIST OPTION NOT SELECTED

## 2018-04-24 ENCOUNTER — Telehealth: Payer: Self-pay

## 2018-04-24 ENCOUNTER — Other Ambulatory Visit: Payer: Self-pay | Admitting: Family Medicine

## 2018-04-24 DIAGNOSIS — N390 Urinary tract infection, site not specified: Secondary | ICD-10-CM

## 2018-04-24 LAB — URINE CULTURE

## 2018-04-24 MED ORDER — AMOXICILLIN 500 MG PO CAPS
500.0000 mg | ORAL_CAPSULE | Freq: Two times a day (BID) | ORAL | 0 refills | Status: AC
Start: 2018-04-24 — End: 2018-05-01

## 2018-04-24 NOTE — Progress Notes (Signed)
Meds ordered this encounter  Medications  . amoxicillin (AMOXIL) 500 MG capsule    Sig: Take 1 capsule (500 mg total) by mouth 2 (two) times daily for 7 days.    Dispense:  14 capsule    Refill:  0     [name]  MSN, FNP-C Patient [facility] [address] [place], [place] [address] [phone]

## 2018-04-24 NOTE — Telephone Encounter (Signed)
-----   Message from [name], [place] sent at 04/24/2018 10:52 AM EDT ----- Regarding: lab results Please inform patient that urine culture is consistent with UTI, will start amoxicillin 500 mg BID for 7 days. Increase water intake, wipe from front to back and practice good perineal hygiene.   Follow up as scheduled.   [name]  MSN, FNP-C Patient [facility] [address] [place], [place] [address] [phone]

## 2018-04-24 NOTE — Telephone Encounter (Signed)
Called, no answer. Left a voicemail for patient to call back. Thanks!  

## 2018-04-25 NOTE — Telephone Encounter (Signed)
Called and spoke with patient. Advised that urine shows UTI. Advised that we will start amoxicillin 500mg  twice daily for 7 days. Asked that she increase water intake and make sure she wipes from front to back when using the bathroom. Patient verbalized understanding. Thanks!

## 2018-04-27 LAB — CANNABINOID (GC/MS), URINE
Cannabinoid: POSITIVE — AB
Carboxy THC (GC/MS): >400 ng/mL

## 2018-04-27 LAB — 737588 9+OXYCODONE+CRT-UNBUND
Amphetamine Scrn, Ur: NEGATIVE ng/mL
BARBITURATE SCREEN URINE: NEGATIVE ng/mL
BENZODIAZEPINE SCREEN, URINE: NEGATIVE ng/mL
COCAINE(METAB.)SCREEN, URINE: NEGATIVE ng/mL
CREATININE(CRT), U: 122.2 mg/dL (ref 20.0–300.0)
METHADONE SCREEN, URINE: NEGATIVE ng/mL
OXYCODONE+OXYMORPHONE UR QL SCN: NEGATIVE ng/mL
PROPOXYPHENE SCREEN URINE: NEGATIVE ng/mL
Ph of Urine: 6 (ref 4.5–8.9)
Phencyclidine Qn, Ur: NEGATIVE ng/mL

## 2018-04-27 LAB — OPIATES CONFIRMATION, URINE
CODEINE: NEGATIVE
HYDROMORPHONE CONF UR: 214 ng/mL
HYDROMORPHONE: POSITIVE — AB
Hydrocodone: NEGATIVE
Morphine: POSITIVE — AB
OPIATES: POSITIVE ng/mL — AB

## 2018-05-10 ENCOUNTER — Other Ambulatory Visit: Payer: Self-pay | Admitting: Internal Medicine

## 2018-05-10 ENCOUNTER — Telehealth: Payer: Self-pay

## 2018-05-10 DIAGNOSIS — Z79891 Long term (current) use of opiate analgesic: Secondary | ICD-10-CM

## 2018-05-10 DIAGNOSIS — D571 Sickle-cell disease without crisis: Secondary | ICD-10-CM

## 2018-05-10 MED ORDER — OXYCODONE HCL 10 MG PO TABS: 10.0000 mg | ORAL_TABLET | Freq: Four times a day (QID) | ORAL | 0 refills | Status: DC | PRN

## 2018-05-10 NOTE — Telephone Encounter (Signed)
Refilled

## 2018-05-22 ENCOUNTER — Other Ambulatory Visit: Payer: Self-pay | Admitting: Internal Medicine

## 2018-05-22 ENCOUNTER — Ambulatory Visit (INDEPENDENT_AMBULATORY_CARE_PROVIDER_SITE_OTHER): Payer: Medicaid Other | Admitting: Family Medicine

## 2018-05-22 ENCOUNTER — Encounter: Payer: Self-pay | Admitting: Family Medicine

## 2018-05-22 DIAGNOSIS — Z79891 Long term (current) use of opiate analgesic: Secondary | ICD-10-CM

## 2018-05-22 DIAGNOSIS — E559 Vitamin D deficiency, unspecified: Secondary | ICD-10-CM

## 2018-05-22 DIAGNOSIS — D571 Sickle-cell disease without crisis: Secondary | ICD-10-CM

## 2018-05-22 MED ORDER — ERGOCALCIFEROL 1.25 MG (50000 UT) PO CAPS: 50000.0000 [IU] | ORAL_CAPSULE | ORAL | 1 refills | Status: DC

## 2018-05-22 MED ORDER — IBUPROFEN 600 MG PO TABS: 600.0000 mg | ORAL_TABLET | Freq: Three times a day (TID) | ORAL | 1 refills | Status: DC | PRN

## 2018-05-22 MED ORDER — MORPHINE SULFATE ER 60 MG PO TBCR: 60.0000 mg | EXTENDED_RELEASE_TABLET | Freq: Two times a day (BID) | ORAL | 0 refills | Status: DC

## 2018-05-22 MED ORDER — ASPIRIN EC 81 MG PO TBEC: 81.0000 mg | DELAYED_RELEASE_TABLET | Freq: Every day | ORAL | 3 refills | Status: DC

## 2018-05-22 MED ORDER — ASPIRIN EC 81 MG PO TBEC: 81.0000 mg | DELAYED_RELEASE_TABLET | Freq: Every day | ORAL | 2 refills | Status: DC

## 2018-05-22 MED ORDER — HYDROXYUREA 500 MG PO CAPS: ORAL_CAPSULE | ORAL | 2 refills | Status: DC

## 2018-05-22 MED ORDER — MORPHINE SULFATE ER 60 MG PO TBCR
60.0000 mg | EXTENDED_RELEASE_TABLET | Freq: Two times a day (BID) | ORAL | 0 refills | Status: AC
Start: 2018-05-22 — End: 2018-06-21

## 2018-05-22 MED ORDER — FOLIC ACID 1 MG PO TABS: 1.0000 mg | ORAL_TABLET | Freq: Every day | ORAL | 3 refills | Status: DC

## 2018-05-22 NOTE — Patient Instructions (Signed)
Sickle Cell Anemia, Adult °Sickle cell anemia is a condition where your red blood cells are shaped like sickles. Red blood cells carry oxygen through the body. Sickle-shaped red blood cells do not live as long as normal red blood cells. They also clump together and block blood from flowing through the blood vessels. These things prevent the body from getting enough oxygen. Sickle cell anemia causes organ damage and pain. It also increases the risk of infection. °Follow these instructions at home: °· Drink enough fluid to keep your pee (urine) clear or pale yellow. Drink more in hot weather and during exercise. °· Do not smoke. Smoking lowers oxygen levels in the blood. °· Only take over-the-counter or prescription medicines as told by your doctor. °· Take antibiotic medicines as told by your doctor. Make sure you finish them even if you start to feel better. °· Take supplements as told by your doctor. °· Consider wearing a medical alert bracelet. This tells anyone caring for you in an emergency of your condition. °· When traveling, keep your medical information, doctors' names, and the medicines you take with you at all times. °· If you have a fever, do not take fever medicines right away. This could cover up a problem. Tell your doctor. °· Keep all follow-up visits with your doctor. Sickle cell anemia requires regular medical care. °Contact a doctor if: °You have a fever. °Get help right away if: °· You feel dizzy or faint. °· You have new belly (abdominal) pain, especially on the left side near the stomach area. °· You have a lasting, often uncomfortable and painful erection of the penis (priapism). If it is not treated right away, you will become unable to have sex (impotence). °· You have numbness in your arms or legs or you have a hard time moving them. °· You have a hard time talking. °· You have a fever or lasting symptoms for more than 2-3 days. °· You have a fever and your symptoms suddenly get  worse. °· You have signs or symptoms of infection. These include: °? Chills. °? Being more tired than normal (lethargy). °? Irritability. °? Poor eating. °? Throwing up (vomiting). °· You have pain that is not helped with medicine. °· You have shortness of breath. °· You have pain in your chest. °· You are coughing up pus-like or bloody mucus. °· You have a stiff neck. °· Your feet or hands swell or have pain. °· Your belly looks bloated. °· Your joints hurt. °This information is not intended to replace advice given to you by your health care provider. Make sure you discuss any questions you have with your health care provider. °Document Released: 08/08/2013 Document Revised: 03/25/2016 Document Reviewed: 05/30/2013 °Elsevier Interactive Patient Education © 2017 [employer] ° °

## 2018-05-22 NOTE — Progress Notes (Signed)
    Subjective   Carmen [name] 27 y.o. female  [id number]  [id number]  1991-07-04    Chief Complaint  Patient presents with  . Sickle Cell Anemia  . Medication Refill    morphine     Patient presents for sickle cell follow up. Patient states that she is doing well on the medications that have been prescribed. Would like to continue. Patient reports generalized 3/10 pain most days. Doing well and with out complaints today.    Review of Systems  Constitutional: Negative.   HENT: Negative.   Eyes: Negative.   Respiratory: Negative.   Cardiovascular: Negative.   Gastrointestinal: Negative.   Genitourinary: Negative.   Musculoskeletal: Positive for myalgias.  Neurological: Negative.   Psychiatric/Behavioral: Negative.     Objective   Physical Exam  Constitutional: She is oriented to person, place, and time. She appears well-developed and well-nourished. No distress.  HENT:  Head: Normocephalic and atraumatic.  Eyes: Pupils are equal, round, and reactive to light. Conjunctivae and EOM are normal.  Neck: Normal range of motion. Neck supple. No JVD present. No thyromegaly (mild enlargement) present.  Cardiovascular: Normal rate, regular rhythm, normal heart sounds and intact distal pulses. Exam reveals no gallop and no friction rub.  No murmur heard. Pulmonary/Chest: Effort normal and breath sounds normal. No respiratory distress. She has no wheezes. She has no rales. She exhibits no tenderness.  Abdominal: Soft. Bowel sounds are normal. She exhibits no distension and no mass. There is no tenderness.  Musculoskeletal: Normal range of motion.  Lymphadenopathy:    She has no cervical adenopathy.  Neurological: She is alert and oriented to person, place, and time.  Skin: Skin is warm and dry.  Psychiatric: She has a normal mood and affect. Her behavior is normal. Judgment and thought content normal.  Nursing note and vitals reviewed.   BP 115/74 (BP Location: Right Arm,  Patient Position: Sitting, Cuff Size: Normal)   Pulse 74   Temp 98.5 F (36.9 C) (Oral)   Resp 14   Ht 5\' 3"  (1.6 m)   Wt 161 lb (73 kg)   LMP 05/19/2018   SpO2 100%   BMI 28.52 kg/m   Assessment   Encounter Diagnoses  Name Primary?  Marland Kitchen Hb-SS disease without crisis ([place])   . Chronic prescription opiate use      Plan  1. Hb-SS disease without crisis ([place])  - morphine (MS CONTIN) 60 MG 12 hr tablet; Take 1 tablet (60 mg total) by mouth every 12 (twelve) hours.  Dispense: 60 tablet; Refill: 0 - hydroxyurea (HYDREA) 500 MG capsule; TAKE 2 CAPSULES BY MOUTH DAILY. MAY TAKE WITH FOOD TO MINIMIZE GI SIDE EFFECTS.  Dispense: 60 capsule; Refill: 2  2. Chronic prescription opiate use  - morphine (MS CONTIN) 60 MG 12 hr tablet; Take 1 tablet (60 mg total) by mouth every 12 (twelve) hours.  Dispense: 60 tablet; Refill: 0  3. Vitamin D deficiency  - ergocalciferol (VITAMIN D2) 50000 units capsule; Take 1 capsule (50,000 Units total) by mouth once a week.  Dispense: 30 capsule; Refill: 1   This note has been created with Surveyor, quantity. Any transcriptional errors are unintentional.

## 2018-05-23 ENCOUNTER — Other Ambulatory Visit: Payer: Self-pay

## 2018-05-23 MED ORDER — IBUPROFEN 600 MG PO TABS: 600.0000 mg | ORAL_TABLET | Freq: Three times a day (TID) | ORAL | 1 refills | Status: DC | PRN

## 2018-06-04 ENCOUNTER — Encounter (HOSPITAL_COMMUNITY): Payer: Self-pay | Admitting: Emergency Medicine

## 2018-06-04 ENCOUNTER — Emergency Department (HOSPITAL_COMMUNITY)
Admission: EM | Admit: 2018-06-04 | Discharge: 2018-06-04 | Discharge status: Against Medical Advice | Payer: Medicaid Other | Attending: Emergency Medicine | Admitting: Emergency Medicine

## 2018-06-04 DIAGNOSIS — Z5320 Procedure and treatment not carried out because of patient's decision for unspecified reasons: Secondary | ICD-10-CM | POA: Insufficient documentation

## 2018-06-04 DIAGNOSIS — Z79899 Other long term (current) drug therapy: Secondary | ICD-10-CM | POA: Diagnosis not present

## 2018-06-04 DIAGNOSIS — M545 Low back pain, unspecified: Secondary | ICD-10-CM

## 2018-06-04 DIAGNOSIS — Z7982 Long term (current) use of aspirin: Secondary | ICD-10-CM | POA: Diagnosis not present

## 2018-06-04 NOTE — ED Notes (Signed)
Pt informed about urine test. She declined giving urine. She states, "I don't think that it is serious enough for an xray and all that."
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
## 2018-06-12 ENCOUNTER — Telehealth: Payer: Self-pay

## 2018-06-12 ENCOUNTER — Other Ambulatory Visit: Payer: Self-pay | Admitting: Internal Medicine

## 2018-06-12 DIAGNOSIS — Z79891 Long term (current) use of opiate analgesic: Secondary | ICD-10-CM

## 2018-06-12 DIAGNOSIS — D571 Sickle-cell disease without crisis: Secondary | ICD-10-CM

## 2018-06-12 MED ORDER — OXYCODONE HCL 10 MG PO TABS: 10.0000 mg | ORAL_TABLET | ORAL | 0 refills | Status: DC | PRN

## 2018-06-12 NOTE — Telephone Encounter (Signed)
Refilled

## 2018-06-22 ENCOUNTER — Ambulatory Visit (INDEPENDENT_AMBULATORY_CARE_PROVIDER_SITE_OTHER): Payer: Medicaid Other | Admitting: Family Medicine

## 2018-06-22 ENCOUNTER — Encounter: Payer: Self-pay | Admitting: Family Medicine

## 2018-06-22 VITALS — BP 116/70 | HR 103 | Temp 98.8°F | Resp 16 | Ht 63.0 in | Wt 158.0 lb

## 2018-06-22 DIAGNOSIS — R350 Frequency of micturition: Secondary | ICD-10-CM

## 2018-06-22 DIAGNOSIS — Z23 Encounter for immunization: Secondary | ICD-10-CM

## 2018-06-22 DIAGNOSIS — N898 Other specified noninflammatory disorders of vagina: Secondary | ICD-10-CM | POA: Diagnosis not present

## 2018-06-22 DIAGNOSIS — Z79891 Long term (current) use of opiate analgesic: Secondary | ICD-10-CM

## 2018-06-22 DIAGNOSIS — D571 Sickle-cell disease without crisis: Secondary | ICD-10-CM | POA: Diagnosis not present

## 2018-06-22 DIAGNOSIS — E559 Vitamin D deficiency, unspecified: Secondary | ICD-10-CM

## 2018-06-22 LAB — POCT URINALYSIS DIPSTICK
Bilirubin, UA: NEGATIVE
Blood, UA: NEGATIVE
Glucose, UA: NEGATIVE
Ketones, UA: NEGATIVE
Leukocytes, UA: NEGATIVE
Nitrite, UA: NEGATIVE
Protein, UA: NEGATIVE
Spec Grav, UA: 1.015 (ref 1.010–1.025)
Urobilinogen, UA: 0.2 E.U./dL
pH, UA: 6 (ref 5.0–8.0)

## 2018-06-22 MED ORDER — OXYCODONE HCL 10 MG PO TABS: 10.0000 mg | ORAL_TABLET | ORAL | 0 refills | Status: DC | PRN

## 2018-06-22 MED ORDER — MORPHINE SULFATE ER 60 MG PO TBCR: 60.0000 mg | EXTENDED_RELEASE_TABLET | Freq: Two times a day (BID) | ORAL | 0 refills | Status: DC

## 2018-06-22 MED ORDER — FLUCONAZOLE 150 MG PO TABS: 150.0000 mg | ORAL_TABLET | Freq: Once | ORAL | 0 refills | Status: AC

## 2018-06-22 NOTE — Progress Notes (Addendum)
PATIENT CARE CENTER INTERNAL MEDICINE AND SICKLE CELL CARE  SICKLE CELL ANEMIA FOLLOW UP VISIT PROVIDER: Lanae Boast, [place]    [phone] Carmen [name]   Subjective :   Past Medical History:  Diagnosis Date  . Abortion    05/2012  . Headache(784.0)   . Sickle cell crisis [facility])     Social History   Socioeconomic History  . Marital status: Single    Spouse name: Not on file  . Number of children: Not on file  . Years of education: Not on file  . Highest education level: Not on file  Occupational History  . Not on file  Social Needs  . Financial resource strain: Not on file  . Food insecurity:    Worry: Not on file    Inability: Not on file  . Transportation needs:    Medical: Not on file    Non-medical: Not on file  Tobacco Use  . Smoking status: Never Smoker  . Smokeless tobacco: Never Used  Substance and Sexual Activity  . Alcohol use: No  . Drug use: No  . Sexual activity: Yes    Birth control/protection: None  Lifestyle  . Physical activity:    Days per week: Not on file    Minutes per session: Not on file  . Stress: Not on file  Relationships  . Social connections:    Talks on phone: Not on file    Gets together: Not on file    Attends religious service: Not on file    Active member of club or organization: Not on file    Attends meetings of clubs or organizations: Not on file    Relationship status: Not on file  . Intimate partner violence:    Fear of current or ex partner: Not on file    Emotionally abused: Not on file    Physically abused: Not on file    Forced sexual activity: Not on file  Other Topics Concern  . Not on file  Social History Narrative  . Not on file    HPI   Carmen [name]  is a 27 y.o.  female who presents for a follow up for Sickle Cell Anemia. her last hospitalization was 04/2018. she has had greater than 6  hospitalizations in the past 12 months.  Pain regimen includes: Ibuprofen, MS Contin and Oxycodone Hydrea  Therapy: Yes Medication compliance: Yes  Pain today is 3/10 and is located:generalized Patient reports adequate hydration.   Review of Systems  Constitutional: Negative.   HENT: Negative.   Eyes: Negative.   Respiratory: Negative.   Cardiovascular: Negative.   Gastrointestinal: Negative.   Genitourinary: Negative.   Musculoskeletal: Positive for myalgias.  Skin: Negative.   Neurological: Negative.   Psychiatric/Behavioral: Negative.     Objective  BP 116/70 (BP Location: Left Arm, Patient Position: Sitting, Cuff Size: Normal)   Pulse (!) 103   Temp 98.8 F (37.1 C) (Oral)   Resp 16   Ht 5\' 3"  (1.6 m)   Wt 158 lb (71.7 kg)   LMP 06/01/2018   SpO2 99%   BMI 27.99 kg/m    Physical Exam  Constitutional: She is oriented to person, place, and time. She appears well-developed and well-nourished. No distress.  HENT:  Head: Normocephalic and atraumatic.  Eyes: Pupils are equal, round, and reactive to light. Conjunctivae and EOM are normal.  Neck: Normal range of motion. Neck supple. No JVD present. No thyromegaly (mild enlargement) present.  Cardiovascular: Normal rate,  regular rhythm, normal heart sounds and intact distal pulses. Exam reveals no gallop and no friction rub.  No murmur heard. Pulmonary/Chest: Effort normal and breath sounds normal. No respiratory distress. She has no wheezes. She has no rales. She exhibits no tenderness.  Abdominal: Soft. Bowel sounds are normal. She exhibits no distension and no mass. There is no tenderness.  Musculoskeletal: Normal range of motion. She exhibits no edema.  Lymphadenopathy:    She has no cervical adenopathy.  Neurological: She is alert and oriented to person, place, and time.  Skin: Skin is warm and dry.  Psychiatric: She has a normal mood and affect. Her behavior is normal. Judgment and thought content normal.  Nursing note and vitals reviewed.     Assessment   Encounter Diagnoses  Name Primary?  . Frequent urination  Yes  . Flu vaccine need   . Vaginal itching   . Hb-SS disease without crisis ([place])   . Chronic prescription opiate use      Plan   1. Frequent urination - Urinalysis Dipstick- normal   2. Flu vaccine need  - Flu Vaccine QUAD 6+ mos PF IM (Fluarix Quad PF)  3. Vaginal itching - Vaginitis/Vaginosis, DNA Probe-pending Will start diflucan.   4. 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  Dispense: 90 tablet; Refill: 0 - morphine (MS CONTIN) 60 MG 12 hr tablet; Take 1 tablet (60 mg total) by mouth every 12 (twelve) hours for 15 days.  Dispense: 30 tablet; Refill: 0 - CBC With Differential - Comprehensive metabolic panel - Reticulocytes  5. Chronic prescription opiate use - Oxycodone HCl 10 MG TABS; Take 1 tablet (10 mg total) by mouth every 4 (four) hours as needed for up to 15 days (PAIN).  Dispense: 90 tablet; Refill: 0 - morphine (MS CONTIN) 60 MG 12 hr tablet; Take 1 tablet (60 mg total) by mouth every 12 (twelve) hours for 15 days.  Dispense: 30 tablet; Refill: 0  6. Vitamin D deficiency - VITAMIN D 25 Hydroxy (Vit-D Deficiency, Fractures)  Return to care as scheduled and prn. Patient verbalized understanding and agreed with plan of care.   1. Sickle cell disease - Continue Hydrea  We discussed the need for good hydration, monitoring of hydration status, avoidance of heat, cold, stress, and infection triggers. We discussed the risks and benefits of Hydrea, including bone marrow suppression, the possibility of GI upset, skin ulcers, hair thinning, and teratogenicity. The patient was reminded of the need to seek medical attention of any symptoms of bleeding, anemia, or infection. Continue folic acid 1 mg daily to prevent aplastic bone marrow crises.   2. Pulmonary evaluation - Patient denies severe recurrent wheezes, shortness of breath with exercise, or persistent  cough. If these symptoms develop, pulmonary function tests with spirometry will be ordered, and if abnormal, plan on referral to Pulmonology for further evaluation.  3. Cardiac - Routine screening for pulmonary hypertension is not recommended.  4. Eye - High risk of proliferative retinopathy. Annual eye exam with retinal exam recommended to patient.  5. Immunization status -  Yearly influenza vaccination is recommended, as well as being up to date with Meningococcal and Pneumococcal vaccines.   6. Acute and chronic painful episodes - We discussed that pt is to receive Schedule II prescriptions only from [place]. Pt is also aware that the prescription history is  available to [place] online through the [place] [facility]. Controlled substance agreement signed (Date). We reminded (Pt) that all patients receiving Schedule II narcotics must be seen for follow within one month of prescription being requested. We reviewed the terms of our pain agreement, including the need to keep medicines in a safe locked location away from children or pets, and the need to report excess sedation or constipation, measures to avoid constipation, and policies related to early refills and stolen prescriptions. According to the [place] Chronic Pain Initiative program, we have reviewed details related to analgesia, adverse effects, aberrant behaviors.  7. Iron overload from chronic transfusion.  Not applicable. If this occurs will use Exjade for management.   8. Vitamin D deficiency - Drisdol 50,000 units weekly. Patient encouraged to take as prescribed.   The above recommendations are taken from the NIH Evidence-Based Management of Sickle Cell Disease: Expert Panel Report, 20149.   [name], FNP-BC Patient [facility] [address] [place], [place] [address] [phone]  This note has been created with Dragon speech recognition software and smart phrase technology. Any transcriptional errors are  unintentional.    Patient positive for BV. Sending medication to the pharmacy on file.

## 2018-06-22 NOTE — Patient Instructions (Signed)
Sickle Cell Anemia, Adult °Sickle cell anemia is a condition where your red blood cells are shaped like sickles. Red blood cells carry oxygen through the body. Sickle-shaped red blood cells do not live as long as normal red blood cells. They also clump together and block blood from flowing through the blood vessels. These things prevent the body from getting enough oxygen. Sickle cell anemia causes organ damage and pain. It also increases the risk of infection. °Follow these instructions at home: °· Drink enough fluid to keep your pee (urine) clear or pale yellow. Drink more in hot weather and during exercise. °· Do not smoke. Smoking lowers oxygen levels in the blood. °· Only take over-the-counter or prescription medicines as told by your doctor. °· Take antibiotic medicines as told by your doctor. Make sure you finish them even if you start to feel better. °· Take supplements as told by your doctor. °· Consider wearing a medical alert bracelet. This tells anyone caring for you in an emergency of your condition. °· When traveling, keep your medical information, doctors' names, and the medicines you take with you at all times. °· If you have a fever, do not take fever medicines right away. This could cover up a problem. Tell your doctor. °· Keep all follow-up visits with your doctor. Sickle cell anemia requires regular medical care. °Contact a doctor if: °You have a fever. °Get help right away if: °· You feel dizzy or faint. °· You have new belly (abdominal) pain, especially on the left side near the stomach area. °· You have a lasting, often uncomfortable and painful erection of the penis (priapism). If it is not treated right away, you will become unable to have sex (impotence). °· You have numbness in your arms or legs or you have a hard time moving them. °· You have a hard time talking. °· You have a fever or lasting symptoms for more than 2-3 days. °· You have a fever and your symptoms suddenly get  worse. °· You have signs or symptoms of infection. These include: °? Chills. °? Being more tired than normal (lethargy). °? Irritability. °? Poor eating. °? Throwing up (vomiting). °· You have pain that is not helped with medicine. °· You have shortness of breath. °· You have pain in your chest. °· You are coughing up pus-like or bloody mucus. °· You have a stiff neck. °· Your feet or hands swell or have pain. °· Your belly looks bloated. °· Your joints hurt. °This information is not intended to replace advice given to you by your health care provider. Make sure you discuss any questions you have with your health care provider. °Document Released: 08/08/2013 Document Revised: 03/25/2016 Document Reviewed: 05/30/2013 °Elsevier Interactive Patient Education © 2017 [employer] ° °

## 2018-06-23 LAB — CBC WITH DIFFERENTIAL
Basophils Absolute: 0 10*3/uL (ref 0.0–0.2)
Basos: 1 %
EOS (ABSOLUTE): 0.2 10*3/uL (ref 0.0–0.4)
Eos: 2 %
Hematocrit: 26.5 % — ABNORMAL LOW (ref 34.0–46.6)
Hemoglobin: 8.4 g/dL — ABNORMAL LOW (ref 11.1–15.9)
Immature Grans (Abs): 0 10*3/uL (ref 0.0–0.1)
Immature Granulocytes: 0 %
Lymphocytes Absolute: 2.7 10*3/uL (ref 0.7–3.1)
Lymphs: 33 %
MCH: 28.3 pg (ref 26.6–33.0)
MCHC: 31.7 g/dL (ref 31.5–35.7)
MCV: 89 fL (ref 79–97)
Monocytes Absolute: 0.9 10*3/uL (ref 0.1–0.9)
Monocytes: 11 %
Neutrophils Absolute: 4.4 10*3/uL (ref 1.4–7.0)
Neutrophils: 53 %
RBC: 2.97 x10E6/uL — ABNORMAL LOW (ref 3.77–5.28)
RDW: 16.6 % — ABNORMAL HIGH (ref 12.3–15.4)
WBC: 8.2 10*3/uL (ref 3.4–10.8)

## 2018-06-23 LAB — COMPREHENSIVE METABOLIC PANEL
ALT: 9 IU/L (ref 0–32)
AST: 15 IU/L (ref 0–40)
Albumin/Globulin Ratio: 1.4 (ref 1.2–2.2)
Albumin: 4.1 g/dL (ref 3.5–5.5)
Alkaline Phosphatase: 56 IU/L (ref 39–117)
BUN/Creatinine Ratio: 17 (ref 9–23)
BUN: 10 mg/dL (ref 6–20)
Bilirubin Total: 1.2 mg/dL (ref 0.0–1.2)
CO2: 19 mmol/L — ABNORMAL LOW (ref 20–29)
Calcium: 9.4 mg/dL (ref 8.7–10.2)
Chloride: 110 mmol/L — ABNORMAL HIGH (ref 96–106)
Creatinine, Ser: 0.58 mg/dL (ref 0.57–1.00)
GFR calc Af Amer: 148 mL/min/{1.73_m2} (ref >59)
GFR calc non Af Amer: 129 mL/min/{1.73_m2} (ref >59)
Globulin, Total: 3 g/dL (ref 1.5–4.5)
Glucose: 88 mg/dL (ref 65–99)
Potassium: 4.1 mmol/L (ref 3.5–5.2)
Sodium: 140 mmol/L (ref 134–144)
Total Protein: 7.1 g/dL (ref 6.0–8.5)

## 2018-06-23 LAB — RETICULOCYTES: Retic Ct Pct: 4.3 % — ABNORMAL HIGH (ref 0.6–2.6)

## 2018-06-23 LAB — VITAMIN D 25 HYDROXY (VIT D DEFICIENCY, FRACTURES): Vit D, 25-Hydroxy: 32.1 ng/mL (ref 30.0–100.0)

## 2018-06-24 LAB — VAGINITIS/VAGINOSIS, DNA PROBE
Candida Species: NEGATIVE
Gardnerella vaginalis: POSITIVE — AB
Trichomonas vaginosis: NEGATIVE

## 2018-07-05 ENCOUNTER — Telehealth: Payer: Self-pay

## 2018-07-05 DIAGNOSIS — D571 Sickle-cell disease without crisis: Secondary | ICD-10-CM

## 2018-07-05 DIAGNOSIS — Z79891 Long term (current) use of opiate analgesic: Secondary | ICD-10-CM

## 2018-07-05 MED ORDER — MORPHINE SULFATE ER 60 MG PO TBCR: 60.0000 mg | EXTENDED_RELEASE_TABLET | Freq: Two times a day (BID) | ORAL | 0 refills | Status: DC

## 2018-07-05 MED ORDER — OXYCODONE HCL 10 MG PO TABS: 10.0000 mg | ORAL_TABLET | ORAL | 0 refills | Status: DC | PRN

## 2018-07-05 MED ORDER — METRONIDAZOLE 500 MG PO TABS: 500.0000 mg | ORAL_TABLET | Freq: Three times a day (TID) | ORAL | 0 refills | Status: AC

## 2018-07-05 NOTE — Addendum Note (Signed)
Addended by: Genelle Bal on: 07/05/2018 10:23 AM   Modules accepted: Orders

## 2018-07-05 NOTE — Progress Notes (Signed)
Patient positive for Bv. Sending medication to the pharmacy on file.

## 2018-07-05 NOTE — Telephone Encounter (Signed)
Refilled

## 2018-07-19 ENCOUNTER — Telehealth: Payer: Self-pay

## 2018-07-20 ENCOUNTER — Ambulatory Visit (INDEPENDENT_AMBULATORY_CARE_PROVIDER_SITE_OTHER): Payer: Medicaid Other | Admitting: Family Medicine

## 2018-07-20 ENCOUNTER — Encounter: Payer: Self-pay | Admitting: Family Medicine

## 2018-07-20 VITALS — BP 119/69 | HR 89 | Temp 98.0°F | Resp 14 | Ht 63.0 in | Wt 159.0 lb

## 2018-07-20 DIAGNOSIS — Z79891 Long term (current) use of opiate analgesic: Secondary | ICD-10-CM | POA: Diagnosis not present

## 2018-07-20 DIAGNOSIS — D571 Sickle-cell disease without crisis: Secondary | ICD-10-CM

## 2018-07-20 DIAGNOSIS — E559 Vitamin D deficiency, unspecified: Secondary | ICD-10-CM

## 2018-07-20 DIAGNOSIS — N926 Irregular menstruation, unspecified: Secondary | ICD-10-CM | POA: Diagnosis not present

## 2018-07-20 DIAGNOSIS — Z3201 Encounter for pregnancy test, result positive: Secondary | ICD-10-CM

## 2018-07-20 LAB — POCT URINE PREGNANCY: Preg Test, Ur: POSITIVE — AB

## 2018-07-20 MED ORDER — DOXYLAMINE-PYRIDOXINE 10-10 MG PO TBEC: 2.0000 | DELAYED_RELEASE_TABLET | Freq: Every day | ORAL | 0 refills | Status: DC

## 2018-07-20 NOTE — Patient Instructions (Signed)
Stop the ibuprofen and hydrea immediately. Contact your hematologist for an appointment. I sent in a referral for a high risk gynecologist. You can take tylenol for pain for now. If severe pain occurs, you can take the regular pain medications.   Doxylamine; Pyridoxine delayed and extended-release tablets What is this medicine? Doxylamine; pyridoxine (dox IL a meen; peer i DOX een) is a combination of an antihistamine and vitamin B6. The drug is used to treat nausea and vomiting associated with pregnancy. This medicine may be used for other purposes; ask your health care provider or pharmacist if you have questions. COMMON BRAND NAME(S): Diclegis What should I tell my health care provider before I take this medicine? They need to know if you have any of these conditions: -contact lenses -glaucoma -liver disease -lung or breathing disease, like asthma or emphysema -pain or trouble passing urine -prostate trouble -ulcers or other stomach problems -an unusual or allergic reaction to doxylamine or pyridoxine (vitamin B6), other medicines, foods, dyes, or preservatives -breast-feeding How should I use this medicine? Take this medicine by mouth with a glass of water. Do not cut, crush or chew this medicine. Follow the directions on the package or prescription label. Take this medicine on an empty stomach. Take your medicine at regular intervals. Do not take it more often than directed. Talk to your pediatrician regarding the use of this medicine in children. Special care may be needed. Overdosage: If you think you have taken too much of this medicine contact a poison control center or emergency room at once. NOTE: This medicine is only for you. Do not share this medicine with others. What if I miss a dose? If you miss a dose, take it as soon as you can. If it is almost time for your next dose, take only that dose. Do not take double or extra doses. What may interact with this  medicine? -alcohol -atropine -antihistamines for allergy, cough and cold -certain medicines for bladder problems like oxybutynin, tolterodine -certain medicines for stomach problems like dicyclomine, hyoscyamine -certain medicines for travel sickness like scopolamine -certain medicines for Parkinson's disease like benztropine, trihexyphenidyl -ipratropium -MAOIs like Carbex, Eldepryl, Marplan, Nardil, and Parnate This list may not describe all possible interactions. Give your health care provider a list of all the medicines, herbs, non-prescription drugs, or dietary supplements you use. Also tell them if you smoke, drink alcohol, or use illegal drugs. Some items may interact with your medicine. What should I watch for while using this medicine? Tell your doctor or healthcare professional if your symptoms do not start to get better or if they get worse. See your doctor right away if you get a high fever or have problems breathing. Your mouth may get dry. Chewing sugarless gum or sucking hard candy, and drinking plenty of water may help. Contact your doctor if the problem does not go away or is severe. This medicine may cause dry eyes and blurred vision. If you wear contact lenses you may feel some discomfort. Lubricating drops may help. See your eye doctor if the problem does not go away or is severe. You may get drowsy or dizzy. Do not drive, use machinery, or do anything that needs mental alertness until you know how this medicine affects you. Do not stand or sit up quickly, especially if you are an older patient. This reduces the risk of dizzy or fainting spells. What side effects may I notice from receiving this medicine? Side effects that you should report to your  doctor or health care professional as soon as possible: -allergic reactions like skin rash, itching or hives, swelling of the face, lips, or tongue -changes in vision -confused, agitated, or nervous -fast, irregular  heartbeat -feeling faint or lightheaded, falls -muscle or facial twitches -seizure -trouble passing urine or change in the amount of urine Side effects that usually do not require medical attention (report to your doctor or health care professional if they continue or are bothersome): -dry mouth -headache -loss of appetite -stomach upset This list may not describe all possible side effects. Call your doctor for medical advice about side effects. You may report side effects to FDA at 1-800-FDA-1088. Where should I keep my medicine? Keep out of the reach of children. Store at room temperature between 15 and 30 degrees C (59 and 86 degrees F). Keep bottle tightly closed and protect from moisture. Do not remove desiccant canister from bottle. Throw away any unused medicine after the expiration date. NOTE: This sheet is a summary. It may not cover all possible information. If you have questions about this medicine, talk to your doctor, pharmacist, or health care provider.  2018 Elsevier/Gold Standard (2015-11-20 10:18:26) Pregnancy and Anemia Anemia is a condition in which the concentration of red blood cells or hemoglobin in the blood is below normal. Hemoglobin is a substance in red blood cells that carries oxygen to the tissues of the body. Anemia results in not enough oxygen reaching these tissues. Anemia during pregnancy is common because the fetus uses more iron and folic acid as it is developing. Your body may not produce enough red blood cells because of this. Also, during pregnancy, the liquid part of the blood (plasma) increases by about 50%, and the red blood cells increase by only 25%. This lowers the concentration of the red blood cells and creates a natural anemia-like situation. What are the causes? The most common cause of anemia during pregnancy is not having enough iron in the body to make red blood cells (iron deficiency anemia). Other causes may include:  Folic acid  deficiency.  Vitamin B12 deficiency.  Certain prescription or over-the-counter medicines.  Certain medical conditions or infections that destroy red blood cells.  A low platelet count and bleeding caused by antibodies that go through the placenta to the fetus from the mother's blood.  What are the signs or symptoms? Mild anemia may not be noticeable. If it becomes severe, symptoms may include:  Tiredness.  Shortness of breath, especially with exercise.  Weakness.  Fainting.  Pale looking skin.  Headaches.  Feeling a fast or irregular heartbeat (palpitations).  How is this diagnosed? The type of anemia is usually diagnosed from your family and medical history and blood tests. How is this treated? Treatment of anemia during pregnancy depends on the cause of the anemia. Treatment can include:  Supplements of iron, vitamin W09, or folic acid.  A blood transfusion. This may be needed if blood loss is severe.  Hospitalization. This may be needed if there is significant continual blood loss.  Dietary changes.  Follow these instructions at home:  Follow your dietitian's or health care provider's dietary recommendations.  Increase your vitamin C intake. This will help the stomach absorb more iron.  Eat a diet rich in iron. This would include foods such as: ? Liver. ? Beef. ? Whole grain bread. ? Eggs. ? Dried fruit.  Take iron and vitamins as directed by your health care provider.  Eat green leafy vegetables. These are a good source  of folic acid. Contact a health care provider if:  You have frequent or lasting headaches.  You are looking pale.  You are bruising easily. Get help right away if:  You have extreme weakness, shortness of breath, or chest pain.  You become dizzy or have trouble concentrating.  You have heavy vaginal bleeding.  You develop a rash.  You have bloody or black, tarry stools.  You faint.  You vomit up blood.  You vomit  repeatedly.  You have abdominal pain.  You have a fever or persistent symptoms for more than 2-3 days.  You have a fever and your symptoms suddenly get worse.  You are dehydrated. This information is not intended to replace advice given to you by your health care provider. Make sure you discuss any questions you have with your health care provider. Document Released: 10/15/2000 Document Revised: 03/25/2016 Document Reviewed: 05/30/2013 Elsevier Interactive Patient Education  2017 [employer].

## 2018-07-20 NOTE — Progress Notes (Signed)
PATIENT CARE CENTER INTERNAL MEDICINE AND SICKLE CELL CARE  SICKLE CELL ANEMIA FOLLOW UP VISIT PROVIDER: Lanae Boast, FNP    Subjective:   Carmen [name]  is a 27 y.o.  female who  has a past medical history of Abortion, Headache(784.0), and Sickle cell crisis ([place]). presents for a follow up for Sickle Cell Anemia. Patient with missed period this month. Patient's last menstrual period was 05/15/2018. She reports having unprotected sex and is not on birth control.   Pain today is 3/10 generalized body aches Patient reports adequate hydration.  Does not take pain medication daily. Only PRN. Takes hydroxyurea and ibuprofen for pain.   Review of Systems  Constitutional: Negative.   HENT: Negative.   Eyes: Negative.   Respiratory: Negative.   Cardiovascular: Negative.   Gastrointestinal: Negative.   Genitourinary: Negative.   Musculoskeletal: Positive for myalgias.  Skin: Negative.   Neurological: Negative.   Psychiatric/Behavioral: Negative.     Objective:   Objective  BP 119/69 (BP Location: Right Arm, Patient Position: Sitting, Cuff Size: Normal)   Pulse 89   Temp 98 F (36.7 C) (Oral)   Resp 14   Ht 5\' 3"  (1.6 m)   Wt 159 lb (72.1 kg)   LMP 05/15/2018   SpO2 100%   BMI 28.17 kg/m    Physical Exam  Constitutional: She is oriented to person, place, and time. She appears well-developed and well-nourished. No distress.  HENT:  Head: Normocephalic and atraumatic.  Eyes: Pupils are equal, round, and reactive to light. EOM are normal.  Cardiovascular: Normal rate and regular rhythm.  Pulmonary/Chest: Effort normal and breath sounds normal. No respiratory distress.  Musculoskeletal: Normal range of motion. She exhibits tenderness.  Neurological: She is alert and oriented to person, place, and time.  Psychiatric: She has a normal mood and affect. Her behavior is normal. Judgment and thought content normal.  Nursing note and vitals reviewed.    Assessment/Plan:     Assessment   Encounter Diagnoses  Name Primary?  . Missed period   . Hb-SS disease without crisis ([place]) Yes  . Vitamin D deficiency   . Chronic prescription opiate use   . Positive pregnancy test      Plan  1. Missed period Positive pregnancy test.  - POCT urine pregnancy - hCG, serum, qualitative - hCG, quantitative, pregnancy  2. Hb-SS disease without crisis ([place]) Stop hydroxyurea and ibuprofen. Can take tylenol for the pain for now. If severe, can take narcotic medication. We discussed the possibility of opoiod dependence in the fetus and the fetal abnormalities with hydroxyuera. - CBC with Differential - Comprehensive metabolic panel  3. Vitamin D deficiency Labs ordered today  4. Chronic prescription opiate use D/C for daily use and use tylenol for pain.   5. Positive pregnancy test Start diclegis for nausea and vomiting. Discussed directions. Also can use ginger for nasuea along with small meals.  - hCG, serum, qualitative - hCG, quantitative, pregnancy - Ambulatory referral to Hematology - Ambulatory referral to Gynecology Patient to d/c hydroxyurea immediately. She will follow up with hematology  Return to care as scheduled and prn. Patient verbalized understanding and agreed with plan of care.   1. Sickle cell disease - Dicsontinue Hydrea.  We discussed the need for good hydration, monitoring of hydration status, avoidance of heat, cold, stress, and infection triggers. We discussed the risks and benefits of Hydrea, including bone marrow suppression, the possibility of GI upset, skin ulcers, hair thinning, and teratogenicity. The patient was  reminded of the need to seek medical attention of any symptoms of bleeding, anemia, or infection. Continue folic acid 1 mg daily to prevent aplastic bone marrow crises.   2. Pulmonary evaluation - Patient denies severe recurrent wheezes, shortness of breath with exercise, or persistent cough. If these symptoms develop,  pulmonary function tests with spirometry will be ordered, and if abnormal, plan on referral to Pulmonology for further evaluation.  3. Cardiac - Routine screening for pulmonary hypertension is not recommended.  4. Eye - High risk of proliferative retinopathy. Annual eye exam with retinal exam recommended to patient.  5. Immunization status -  Yearly influenza vaccination is recommended, as well as being up to date with Meningococcal and Pneumococcal vaccines.   6. Acute and chronic painful episodes - We discussed that pt is to receive Schedule II prescriptions only from [place]. Pt is also aware that the prescription history is available to [place] online through the [facility] CSRS. Controlled substance agreement signed. We reminded Carmen [name] that all patients receiving Schedule II narcotics must be seen for follow within one month of prescription being requested. We reviewed the terms of our pain agreement, including the need to keep medicines in a safe locked location away from children or pets, and the need to report excess sedation or constipation, measures to avoid constipation, and policies related to early refills and stolen prescriptions. According to the [place] Chronic Pain Initiative program, we have reviewed details related to analgesia, adverse effects, aberrant behaviors.  7. Iron overload from chronic transfusion.  Not applicable at this time.  If this occurs will use Exjade for management.   8. Vitamin D deficiency - Drisdol 50,000 units weekly. Patient encouraged to take as prescribed.   The above recommendations are taken from the NIH Evidence-Based Management of Sickle Cell Disease: Expert Panel Report, 20149.   [name], FNP-BC Patient [facility] [address] [place], [place] [address] [phone]  This note has been created with Dragon speech recognition software and smart phrase technology. Any transcriptional errors are unintentional.

## 2018-07-21 ENCOUNTER — Ambulatory Visit: Payer: Medicaid Other | Admitting: Family Medicine

## 2018-07-21 LAB — COMPREHENSIVE METABOLIC PANEL
ALT: 31 IU/L (ref 0–32)
AST: 38 IU/L (ref 0–40)
Albumin/Globulin Ratio: 1.3 (ref 1.2–2.2)
Albumin: 4.5 g/dL (ref 3.5–5.5)
Alkaline Phosphatase: 53 IU/L (ref 39–117)
BUN/Creatinine Ratio: 20 (ref 9–23)
BUN: 10 mg/dL (ref 6–20)
Bilirubin Total: 1.1 mg/dL (ref 0.0–1.2)
CO2: 17 mmol/L — ABNORMAL LOW (ref 20–29)
Calcium: 9.8 mg/dL (ref 8.7–10.2)
Chloride: 100 mmol/L (ref 96–106)
Creatinine, Ser: 0.49 mg/dL — ABNORMAL LOW (ref 0.57–1.00)
GFR calc Af Amer: 157 mL/min/{1.73_m2} (ref >59)
GFR calc non Af Amer: 136 mL/min/{1.73_m2} (ref >59)
Globulin, Total: 3.4 g/dL (ref 1.5–4.5)
Glucose: 90 mg/dL (ref 65–99)
Potassium: 5.4 mmol/L — ABNORMAL HIGH (ref 3.5–5.2)
Sodium: 136 mmol/L (ref 134–144)
Total Protein: 7.9 g/dL (ref 6.0–8.5)

## 2018-07-21 LAB — CBC WITH DIFFERENTIAL/PLATELET
Basophils Absolute: 0 10*3/uL (ref 0.0–0.2)
Basos: 0 %
EOS (ABSOLUTE): 0.2 10*3/uL (ref 0.0–0.4)
Eos: 2 %
Hematocrit: 24.5 % — ABNORMAL LOW (ref 34.0–46.6)
Hemoglobin: 8.1 g/dL — ABNORMAL LOW (ref 11.1–15.9)
Immature Grans (Abs): 0.1 10*3/uL (ref 0.0–0.1)
Immature Granulocytes: 1 %
Lymphocytes Absolute: 1.9 10*3/uL (ref 0.7–3.1)
Lymphs: 22 %
MCH: 28.8 pg (ref 26.6–33.0)
MCHC: 33.1 g/dL (ref 31.5–35.7)
MCV: 87 fL (ref 79–97)
Monocytes Absolute: 1.1 10*3/uL — ABNORMAL HIGH (ref 0.1–0.9)
Monocytes: 12 %
Neutrophils Absolute: 5.5 10*3/uL (ref 1.4–7.0)
Neutrophils: 63 %
Platelets: 518 10*3/uL — ABNORMAL HIGH (ref 150–450)
RBC: 2.81 x10E6/uL — ABNORMAL LOW (ref 3.77–5.28)
RDW: 15.6 % — ABNORMAL HIGH (ref 12.3–15.4)
WBC: 8.7 10*3/uL (ref 3.4–10.8)

## 2018-07-21 LAB — HCG, SERUM, QUALITATIVE: hCG,Beta Subunit,Qual,Serum: POSITIVE m[IU]/mL — AB (ref <6)

## 2018-07-25 ENCOUNTER — Telehealth: Payer: Self-pay

## 2018-07-25 NOTE — Telephone Encounter (Signed)
Spoke to patient and let her know that referral was sent to [facility] to see if they can accept patient.

## 2018-07-27 ENCOUNTER — Encounter: Payer: Self-pay | Admitting: Obstetrics and Gynecology

## 2018-08-02 ENCOUNTER — Telehealth: Payer: Self-pay

## 2018-08-02 DIAGNOSIS — Z79891 Long term (current) use of opiate analgesic: Secondary | ICD-10-CM

## 2018-08-02 DIAGNOSIS — D571 Sickle-cell disease without crisis: Secondary | ICD-10-CM

## 2018-08-03 MED ORDER — OXYCODONE HCL 10 MG PO TABS: 10.0000 mg | ORAL_TABLET | ORAL | 0 refills | Status: DC | PRN

## 2018-08-03 MED ORDER — MORPHINE SULFATE ER 60 MG PO TBCR: 60.0000 mg | EXTENDED_RELEASE_TABLET | Freq: Two times a day (BID) | ORAL | 0 refills | Status: DC

## 2018-08-03 NOTE — Telephone Encounter (Signed)
refilled 

## 2018-08-15 ENCOUNTER — Telehealth: Payer: Self-pay

## 2018-08-15 NOTE — Telephone Encounter (Signed)
Called to remind of appointment on 08/17/2018. No answer. Message left. Thanks!   

## 2018-08-17 ENCOUNTER — Encounter: Payer: Self-pay | Admitting: Obstetrics and Gynecology

## 2018-08-17 ENCOUNTER — Ambulatory Visit (INDEPENDENT_AMBULATORY_CARE_PROVIDER_SITE_OTHER): Payer: Medicaid Other | Admitting: Family Medicine

## 2018-08-17 ENCOUNTER — Encounter: Payer: Self-pay | Admitting: Family Medicine

## 2018-08-17 ENCOUNTER — Other Ambulatory Visit (HOSPITAL_COMMUNITY)
Admission: RE | Admit: 2018-08-17 | Discharge: 2018-08-17 | Disposition: A | Discharge status: Home / Self Care | Payer: Medicaid Other | Source: Ambulatory Visit | Attending: Obstetrics and Gynecology | Admitting: Obstetrics and Gynecology

## 2018-08-17 ENCOUNTER — Ambulatory Visit (INDEPENDENT_AMBULATORY_CARE_PROVIDER_SITE_OTHER): Payer: Medicaid Other | Admitting: Obstetrics and Gynecology

## 2018-08-17 VITALS — BP 118/66 | HR 88 | Temp 98.6°F | Ht 63.0 in | Wt 159.0 lb

## 2018-08-17 VITALS — BP 116/57 | HR 90 | Wt 159.0 lb

## 2018-08-17 DIAGNOSIS — O0993 Supervision of high risk pregnancy, unspecified, third trimester: Secondary | ICD-10-CM

## 2018-08-17 DIAGNOSIS — Z79891 Long term (current) use of opiate analgesic: Secondary | ICD-10-CM

## 2018-08-17 DIAGNOSIS — D571 Sickle-cell disease without crisis: Secondary | ICD-10-CM

## 2018-08-17 DIAGNOSIS — O99013 Anemia complicating pregnancy, third trimester: Secondary | ICD-10-CM

## 2018-08-17 DIAGNOSIS — Z3A13 13 weeks gestation of pregnancy: Secondary | ICD-10-CM | POA: Insufficient documentation

## 2018-08-17 DIAGNOSIS — O0991 Supervision of high risk pregnancy, unspecified, first trimester: Secondary | ICD-10-CM | POA: Insufficient documentation

## 2018-08-17 DIAGNOSIS — O0990 Supervision of high risk pregnancy, unspecified, unspecified trimester: Secondary | ICD-10-CM

## 2018-08-17 LAB — POCT URINALYSIS DIP (MANUAL ENTRY)
Bilirubin, UA: NEGATIVE
Blood, UA: NEGATIVE
Glucose, UA: NEGATIVE mg/dL
Ketones, POC UA: NEGATIVE mg/dL
Leukocytes, UA: NEGATIVE
Nitrite, UA: NEGATIVE
Protein Ur, POC: NEGATIVE mg/dL
Spec Grav, UA: 1.01 (ref 1.010–1.025)
Urobilinogen, UA: 1 E.U./dL
pH, UA: 5.5 (ref 5.0–8.0)

## 2018-08-17 MED ORDER — DOCUSATE SODIUM 50 MG PO CAPS: 50.0000 mg | ORAL_CAPSULE | Freq: Two times a day (BID) | ORAL | 1 refills | Status: AC | PRN

## 2018-08-17 MED ORDER — ASPIRIN EC 81 MG PO TBEC: 81.0000 mg | DELAYED_RELEASE_TABLET | Freq: Every day | ORAL | 3 refills | Status: DC

## 2018-08-17 MED ORDER — FOLIC ACID 1 MG PO TABS: 1.0000 mg | ORAL_TABLET | Freq: Every day | ORAL | 3 refills | Status: DC

## 2018-08-17 MED ORDER — PREPLUS 27-1 MG PO TABS: 1.0000 | ORAL_TABLET | Freq: Every day | ORAL | 13 refills | Status: DC

## 2018-08-17 MED ORDER — OXYCODONE HCL 10 MG PO TABS: 10.0000 mg | ORAL_TABLET | ORAL | 0 refills | Status: DC | PRN

## 2018-08-17 NOTE — Patient Instructions (Signed)
Sickle Cell Anemia, Adult °Sickle cell anemia is a condition where your red blood cells are shaped like sickles. Red blood cells carry oxygen through the body. Sickle-shaped red blood cells do not live as long as normal red blood cells. They also clump together and block blood from flowing through the blood vessels. These things prevent the body from getting enough oxygen. Sickle cell anemia causes organ damage and pain. It also increases the risk of infection. °Follow these instructions at home: °· Drink enough fluid to keep your pee (urine) clear or pale yellow. Drink more in hot weather and during exercise. °· Do not smoke. Smoking lowers oxygen levels in the blood. °· Only take over-the-counter or prescription medicines as told by your doctor. °· Take antibiotic medicines as told by your doctor. Make sure you finish them even if you start to feel better. °· Take supplements as told by your doctor. °· Consider wearing a medical alert bracelet. This tells anyone caring for you in an emergency of your condition. °· When traveling, keep your medical information, doctors' names, and the medicines you take with you at all times. °· If you have a fever, do not take fever medicines right away. This could cover up a problem. Tell your doctor. °· Keep all follow-up visits with your doctor. Sickle cell anemia requires regular medical care. °Contact a doctor if: °You have a fever. °Get help right away if: °· You feel dizzy or faint. °· You have new belly (abdominal) pain, especially on the left side near the stomach area. °· You have a lasting, often uncomfortable and painful erection of the penis (priapism). If it is not treated right away, you will become unable to have sex (impotence). °· You have numbness in your arms or legs or you have a hard time moving them. °· You have a hard time talking. °· You have a fever or lasting symptoms for more than 2-3 days. °· You have a fever and your symptoms suddenly get  worse. °· You have signs or symptoms of infection. These include: °? Chills. °? Being more tired than normal (lethargy). °? Irritability. °? Poor eating. °? Throwing up (vomiting). °· You have pain that is not helped with medicine. °· You have shortness of breath. °· You have pain in your chest. °· You are coughing up pus-like or bloody mucus. °· You have a stiff neck. °· Your feet or hands swell or have pain. °· Your belly looks bloated. °· Your joints hurt. °This information is not intended to replace advice given to you by your health care provider. Make sure you discuss any questions you have with your health care provider. °Document Released: 08/08/2013 Document Revised: 03/25/2016 Document Reviewed: 05/30/2013 °Elsevier Interactive Patient Education © 2017 [employer] ° °

## 2018-08-17 NOTE — Progress Notes (Signed)
Subjective:  Carmen [name] is a 27 y.o. G2P0010 at [redacted]w[redacted]d being seen today for her first OB visit. EDD by certain LMP. H/O Hb SS, followed by Heme/Onc. Last crisis was 1-2 months ago. Weaning off pain medication as per Heme/Onc. .  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  Patient reports no complaints.  Contractions: Not present. Vag. Bleeding: None.  Movement: Absent. Denies leaking of fluid.   The following portions of the patient's history were reviewed and updated as appropriate: allergies, current medications, past family history, past medical history, past social history, past surgical history and problem list. Problem list updated.  Objective:   Vitals:   08/17/18 1406  BP: (!) 116/57  Pulse: 90  Weight: 159 lb (72.1 kg)    Fetal Status: Fetal Heart Rate (bpm): 163   Movement: Absent     General:  Alert, oriented and cooperative. Patient is in no acute distress.  Skin: Skin is warm and dry. No rash noted.   Cardiovascular: Normal heart rate noted  Respiratory: Normal respiratory effort, no problems with respiration noted  Abdomen: Soft, gravid, appropriate for gestational age. Pain/Pressure: Present     Pelvic:  Cervical exam deferred        Extremities: Normal range of motion.  Edema: None  Mental Status: Normal mood and affect. Normal behavior. Normal judgment and thought content.   Urinalysis:      Assessment and Plan:  Pregnancy: G2P0010 at [redacted]w[redacted]d  1. Supervision of high risk pregnancy, antepartum Prenatal care and labs reviewed with pt.  - Culture, OB Urine - Cystic fibrosis  gene test - Genetic Screening - Obstetric Panel, Including HIV - SMN1 COPY NUMBER ANALYSIS (SMA Carrier Screen) - [place] MFM OB DETAIL +14 WK; Future - Cervicovaginal ancillary only  2. Hb-SS disease without crisis [facility]) Discussed with pt. Advised to continue care with Heme/Onc. Will refer for genetic counseling/testing Restart Folic acid and BASA - folic acid (FOLVITE) 1 MG tablet; Take 1 tablet (1 mg total) by mouth daily.  Dispense: 90 tablet; Refill: 3 - AMB referral to maternal fetal medicine  Preterm labor symptoms and general obstetric precautions including but not limited to vaginal bleeding, contractions, leaking of fluid and fetal movement were reviewed in detail with the patient. Please refer to After Visit Summary for other counseling recommendations.  Return in about 4 weeks (around 09/14/2018) for OB visit.   [name], MD

## 2018-08-17 NOTE — Patient Instructions (Signed)
First Trimester of Pregnancy The first trimester of pregnancy is from week 1 until the end of week 13 (months 1 through 3). A week after a sperm fertilizes an egg, the egg will implant on the wall of the uterus. This embryo will begin to develop into a baby. Genes from you and your partner will form the baby. The female genes will determine whether the baby will be a boy or a girl. At 6-8 weeks, the eyes and face will be formed, and the heartbeat can be seen on ultrasound. At the end of 12 weeks, all the baby's organs will be formed. Now that you are pregnant, you will want to do everything you can to have a healthy baby. Two of the most important things are to get good prenatal care and to follow your health care provider's instructions. Prenatal care is all the medical care you receive before the baby's birth. This care will help prevent, find, and treat any problems during the pregnancy and childbirth. Body changes during your first trimester Your body goes through many changes during pregnancy. The changes vary from woman to woman.  You may gain or lose a couple of pounds at first.  You may feel sick to your stomach (nauseous) and you may throw up (vomit). If the vomiting is uncontrollable, call your health care provider.  You may tire easily.  You may develop headaches that can be relieved by medicines. All medicines should be approved by your health care provider.  You may urinate more often. Painful urination may mean you have a bladder infection.  You may develop heartburn as a result of your pregnancy.  You may develop constipation because certain hormones are causing the muscles that push stool through your intestines to slow down.  You may develop hemorrhoids or swollen veins (varicose veins).  Your breasts may begin to grow larger and become tender. Your nipples may stick out more, and the tissue that surrounds them (areola) may become darker.  Your gums may bleed and may be  sensitive to brushing and flossing.  Dark spots or blotches (chloasma, mask of pregnancy) may develop on your face. This will likely fade after the baby is born.  Your menstrual periods will stop.  You may have a loss of appetite.  You may develop cravings for certain kinds of food.  You may have changes in your emotions from day to day, such as being excited to be pregnant or being concerned that something may go wrong with the pregnancy and baby.  You may have more vivid and strange dreams.  You may have changes in your hair. These can include thickening of your hair, rapid growth, and changes in texture. Some women also have hair loss during or after pregnancy, or hair that feels dry or thin. Your hair will most likely return to normal after your baby is born.  What to expect at prenatal visits During a routine prenatal visit:  You will be weighed to make sure you and the baby are growing normally.  Your blood pressure will be taken.  Your abdomen will be measured to track your baby's growth.  The fetal heartbeat will be listened to between weeks 10 and 14 of your pregnancy.  Test results from any previous visits will be discussed.  Your health care provider may ask you:  How you are feeling.  If you are feeling the baby move.  If you have had any abnormal symptoms, such as leaking fluid, bleeding, severe headaches,   or abdominal cramping.  If you are using any tobacco products, including cigarettes, chewing tobacco, and electronic cigarettes.  If you have any questions.  Other tests that may be performed during your first trimester include:  Blood tests to find your blood type and to check for the presence of any previous infections. The tests will also be used to check for low iron levels (anemia) and protein on red blood cells (Rh antibodies). Depending on your risk factors, or if you previously had diabetes during pregnancy, you may have tests to check for high blood  sugar that affects pregnant women (gestational diabetes).  Urine tests to check for infections, diabetes, or protein in the urine.  An ultrasound to confirm the proper growth and development of the baby.  Fetal screens for spinal cord problems (spina bifida) and Down syndrome.  HIV (human immunodeficiency virus) testing. Routine prenatal testing includes screening for HIV, unless you choose not to have this test.  You may need other tests to make sure you and the baby are doing well.  Follow these instructions at home: Medicines  Follow your health care provider's instructions regarding medicine use. Specific medicines may be either safe or unsafe to take during pregnancy.  Take a prenatal vitamin that contains at least 600 micrograms (mcg) of folic acid.  If you develop constipation, try taking a stool softener if your health care provider approves. Eating and drinking  Eat a balanced diet that includes fresh fruits and vegetables, whole grains, good sources of protein such as meat, eggs, or tofu, and low-fat dairy. Your health care provider will help you determine the amount of weight gain that is right for you.  Avoid raw meat and uncooked cheese. These carry germs that can cause birth defects in the baby.  Eating four or five small meals rather than three large meals a day may help relieve nausea and vomiting. If you start to feel nauseous, eating a few soda crackers can be helpful. Drinking liquids between meals, instead of during meals, also seems to help ease nausea and vomiting.  Limit foods that are high in fat and processed sugars, such as fried and sweet foods.  To prevent constipation: ? Eat foods that are high in fiber, such as fresh fruits and vegetables, whole grains, and beans. ? Drink enough fluid to keep your urine clear or pale yellow. Activity  Exercise only as directed by your health care provider. Most women can continue their usual exercise routine during  pregnancy. Try to exercise for 30 minutes at least 5 days a week. Exercising will help you: ? Control your weight. ? Stay in shape. ? Be prepared for labor and delivery.  Experiencing pain or cramping in the lower abdomen or lower back is a good sign that you should stop exercising. Check with your health care provider before continuing with normal exercises.  Try to avoid standing for long periods of time. Move your legs often if you must stand in one place for a long time.  Avoid heavy lifting.  Wear low-heeled shoes and practice good posture.  You may continue to have sex unless your health care provider tells you not to. Relieving pain and discomfort  Wear a good support bra to relieve breast tenderness.  Take warm sitz baths to soothe any pain or discomfort caused by hemorrhoids. Use hemorrhoid cream if your health care provider approves.  Rest with your legs elevated if you have leg cramps or low back pain.  If you develop   varicose veins in your legs, wear support hose. Elevate your feet for 15 minutes, 3-4 times a day. Limit salt in your diet. Prenatal care  Schedule your prenatal visits by the twelfth week of pregnancy. They are usually scheduled monthly at first, then more often in the last 2 months before delivery.  Write down your questions. Take them to your prenatal visits.  Keep all your prenatal visits as told by your health care provider. This is important. Safety  Wear your seat belt at all times when driving.  Make a list of emergency phone numbers, including numbers for family, friends, the hospital, and police and fire departments. General instructions  Ask your health care provider for a referral to a local prenatal education class. Begin classes no later than the beginning of month 6 of your pregnancy.  Ask for help if you have counseling or nutritional needs during pregnancy. Your health care provider can offer advice or refer you to specialists for help  with various needs.  Do not use hot tubs, steam rooms, or saunas.  Do not douche or use tampons or scented sanitary pads.  Do not cross your legs for long periods of time.  Avoid cat litter boxes and soil used by cats. These carry germs that can cause birth defects in the baby and possibly loss of the fetus by miscarriage or stillbirth.  Avoid all smoking, herbs, alcohol, and medicines not prescribed by your health care provider. Chemicals in these products affect the formation and growth of the baby.  Do not use any products that contain nicotine or tobacco, such as cigarettes and e-cigarettes. If you need help quitting, ask your health care provider. You may receive counseling support and other resources to help you quit.  Schedule a dentist appointment. At home, brush your teeth with a soft toothbrush and be gentle when you floss. Contact a health care provider if:  You have dizziness.  You have mild pelvic cramps, pelvic pressure, or nagging pain in the abdominal area.  You have persistent nausea, vomiting, or diarrhea.  You have a bad smelling vaginal discharge.  You have pain when you urinate.  You notice increased swelling in your face, hands, legs, or ankles.  You are exposed to fifth disease or chickenpox.  You are exposed to German measles (rubella) and have never had it. Get help right away if:  You have a fever.  You are leaking fluid from your vagina.  You have spotting or bleeding from your vagina.  You have severe abdominal cramping or pain.  You have rapid weight gain or loss.  You vomit blood or material that looks like coffee grounds.  You develop a severe headache.  You have shortness of breath.  You have any kind of trauma, such as from a fall or a car accident. Summary  The first trimester of pregnancy is from week 1 until the end of week 13 (months 1 through 3).  Your body goes through many changes during pregnancy. The changes vary from  woman to woman.  You will have routine prenatal visits. During those visits, your health care provider will examine you, discuss any test results you may have, and talk with you about how you are feeling. This information is not intended to replace advice given to you by your health care provider. Make sure you discuss any questions you have with your health care provider. Document Released: 10/12/2001 Document Revised: 09/29/2016 Document Reviewed: 09/29/2016 Elsevier Interactive Patient Education  2018 Elsevier   Inc.  

## 2018-08-17 NOTE — Progress Notes (Signed)
[facility] INTERNAL MEDICINE AND SICKLE CELL CARE  SICKLE CELL ANEMIA FOLLOW UP VISIT PROVIDER: Lanae Boast, FNP    Subjective:   Carmen [name]  is a 27 y.o.  female who  has a past medical history of Abortion, Headache(784.0), and Sickle cell crisis ([place]). presents for a follow up for Sickle Cell Anemia.  Patient is currently pregnant.  She has an appointment today with the gynecologist.  Patient states that she has stopped taking the long-acting oxycodone and has increased Tylenol use.  Stopped ibuprofen. Takes the immediate oxycodone no more than 2 times a day. Patient states that she needs to improve her hydration and eat healthier foods. She is working on this now with her mother.      Review of Systems  Constitutional: Negative.   HENT: Negative.   Eyes: Negative.   Respiratory: Negative.   Cardiovascular: Negative.   Gastrointestinal: Negative.   Genitourinary: Negative.   Musculoskeletal: Negative.   Skin: Negative.   Neurological: Negative.   Psychiatric/Behavioral: Negative.     Objective:   Objective  BP 118/66 (BP Location: Left Arm, Patient Position: Sitting, Cuff Size: Small)   Pulse 88   Temp 98.6 F (37 C) (Oral)   Ht 5\' 3"  (1.6 m)   Wt 159 lb (72.1 kg)   LMP 05/16/2018   SpO2 99%   BMI 28.17 kg/m    Physical Exam  Constitutional: She is oriented to person, place, and time. She appears well-developed and well-nourished. No distress.  HENT:  Head: Normocephalic and atraumatic.  Eyes: Pupils are equal, round, and reactive to light. Conjunctivae and EOM are normal.  Neck: Normal range of motion. Neck supple. No JVD present. No thyromegaly present.  Cardiovascular: Normal rate, regular rhythm, normal heart sounds and intact distal pulses. Exam reveals no gallop and no friction rub.  No murmur heard. Pulmonary/Chest: Effort normal and breath sounds normal. No respiratory distress. She has no wheezes. She has no rales. She exhibits no  tenderness.  Abdominal: Soft. Bowel sounds are normal. She exhibits no distension and no mass. There is no tenderness.  Musculoskeletal: Normal range of motion.  Lymphadenopathy:    She has no cervical adenopathy.  Neurological: She is alert and oriented to person, place, and time.  Skin: Skin is warm and dry.  Psychiatric: She has a normal mood and affect. Her behavior is normal. Judgment and thought content normal.  Nursing note and vitals reviewed.    Assessment/Plan:   Assessment   Encounter Diagnoses  Name Primary?  Marland Kitchen Hb-SS disease without crisis ([place]) Yes  . Chronic prescription opiate use   . High risk pregnancy, antepartum      Plan  1. Hb-SS disease without crisis ([place]) Encouraged increase in hydration during pregnancy.  Advised Tylenol use and to only use oxycodone for severe pain.  Oxycodone dispensed amount decreased.  - POCT urinalysis dipstick - Oxycodone HCl 10 MG TABS; Take 1 tablet (10 mg total) by mouth every 4 (four) hours as needed for up to 15 days (PAIN).  Dispense: 45 tablet; Refill: 0 - Vitamin D, 25-hydroxy - Comprehensive metabolic panel - Reticulocytes - CBC With Differential - Ferritin  2. Chronic prescription opiate use Discussed weaning off narcotics during pregnancy. Patient reports using a max of 2 per day. Would like her to take only PRN.  - Oxycodone HCl 10 MG TABS; Take 1 tablet (10 mg total) by mouth every 4 (four) hours as needed for up to 15 days (PAIN).  Dispense: 45 tablet; Refill: 0  3. High risk pregnancy, antepartum Patient to see high risk GYN today.    Return to care as scheduled and prn. Patient verbalized understanding and agreed with plan of care.   1. Sickle cell disease -  We discussed the need for good hydration, monitoring of hydration status, avoidance of heat, cold, stress, and infection triggers. We discussed the risks and benefits of Hydrea, including bone marrow suppression, the possibility of GI upset, skin ulcers,  hair thinning, and teratogenicity. The patient was reminded of the need to seek medical attention of any symptoms of bleeding, anemia, or infection. Continue folic acid 1 mg daily to prevent aplastic bone marrow crises.   2. Pulmonary evaluation - Patient denies severe recurrent wheezes, shortness of breath with exercise, or persistent cough. If these symptoms develop, pulmonary function tests with spirometry will be ordered, and if abnormal, plan on referral to Pulmonology for further evaluation.  3. Cardiac - Routine screening for pulmonary hypertension is not recommended.  4. Eye - High risk of proliferative retinopathy. Annual eye exam with retinal exam recommended to patient.  5. Immunization status -  Yearly influenza vaccination is recommended, as well as being up to date with Meningococcal and Pneumococcal vaccines.   6. Acute and chronic painful episodes - We discussed that pt is to receive Schedule II prescriptions only from [place]. Pt is also aware that the prescription history is available to [place] online through the [facility] CSRS. Controlled substance agreement signed . We reminded Carmen [name] that all patients receiving Schedule II narcotics must be seen for follow within one month of prescription being requested. We reviewed the terms of our pain agreement, including the need to keep medicines in a safe locked location away from children or pets, and the need to report excess sedation or constipation, measures to avoid constipation, and policies related to early refills and stolen prescriptions. According to the [place] [facility] program, we have reviewed details related to analgesia, adverse effects, aberrant behaviors.  7. Iron overload from chronic transfusion.  Not applicable at this time.  If this occurs will use Exjade for management.   8. Vitamin D deficiency - Drisdol 50,000 units weekly. Patient encouraged to take as prescribed.   The above recommendations are taken from the  NIH Evidence-Based Management of Sickle Cell Disease: Expert Panel Report, 20149.   [name], FNP-BC [name] [address] [place],  [id number] [phone]  This note has been created with Dragon speech recognition software and smart phrase technology. Any transcriptional errors are unintentional.

## 2018-08-18 LAB — COMPREHENSIVE METABOLIC PANEL
ALT: 24 IU/L (ref 0–32)
AST: 30 IU/L (ref 0–40)
Albumin/Globulin Ratio: 1.3 (ref 1.2–2.2)
Albumin: 4.5 g/dL (ref 3.5–5.5)
Alkaline Phosphatase: 63 IU/L (ref 39–117)
BUN/Creatinine Ratio: 17 (ref 9–23)
BUN: 9 mg/dL (ref 6–20)
Bilirubin Total: 1.2 mg/dL (ref 0.0–1.2)
CO2: 16 mmol/L — ABNORMAL LOW (ref 20–29)
Calcium: 9.6 mg/dL (ref 8.7–10.2)
Chloride: 103 mmol/L (ref 96–106)
Creatinine, Ser: 0.53 mg/dL — ABNORMAL LOW (ref 0.57–1.00)
GFR calc Af Amer: 153 mL/min/{1.73_m2} (ref >59)
GFR calc non Af Amer: 132 mL/min/{1.73_m2} (ref >59)
Globulin, Total: 3.4 g/dL (ref 1.5–4.5)
Glucose: 82 mg/dL (ref 65–99)
Potassium: 5 mmol/L (ref 3.5–5.2)
Sodium: 139 mmol/L (ref 134–144)
Total Protein: 7.9 g/dL (ref 6.0–8.5)

## 2018-08-18 LAB — VITAMIN D 25 HYDROXY (VIT D DEFICIENCY, FRACTURES): Vit D, 25-Hydroxy: 22 ng/mL — ABNORMAL LOW (ref 30.0–100.0)

## 2018-08-18 LAB — FERRITIN: Ferritin: 207 ng/mL — ABNORMAL HIGH (ref 15–150)

## 2018-08-18 LAB — CERVICOVAGINAL ANCILLARY ONLY
Chlamydia: NEGATIVE
Neisseria Gonorrhea: NEGATIVE

## 2018-08-19 ENCOUNTER — Other Ambulatory Visit: Payer: Self-pay | Admitting: Internal Medicine

## 2018-08-19 LAB — URINE CULTURE, OB REFLEX

## 2018-08-19 LAB — CULTURE, OB URINE

## 2018-08-21 ENCOUNTER — Telehealth: Payer: Self-pay

## 2018-08-21 NOTE — Telephone Encounter (Signed)
Authorization was faxed to [facility] for high dose PA.

## 2018-08-22 ENCOUNTER — Encounter: Payer: Self-pay | Admitting: *Deleted

## 2018-08-23 ENCOUNTER — Ambulatory Visit (HOSPITAL_COMMUNITY)
Admission: RE | Admit: 2018-08-23 | Discharge: 2018-08-23 | Disposition: A | Discharge status: Home / Self Care | Payer: Medicaid Other | Source: Ambulatory Visit | Attending: Obstetrics and Gynecology | Admitting: Obstetrics and Gynecology

## 2018-08-23 ENCOUNTER — Ambulatory Visit (HOSPITAL_COMMUNITY): Payer: Medicaid Other

## 2018-08-23 ENCOUNTER — Ambulatory Visit (HOSPITAL_COMMUNITY): Payer: Self-pay

## 2018-08-23 ENCOUNTER — Other Ambulatory Visit (HOSPITAL_COMMUNITY): Payer: Self-pay | Admitting: *Deleted

## 2018-08-23 DIAGNOSIS — Z363 Encounter for antenatal screening for malformations: Secondary | ICD-10-CM

## 2018-08-25 ENCOUNTER — Other Ambulatory Visit (HOSPITAL_COMMUNITY): Payer: Self-pay | Admitting: Obstetrics and Gynecology

## 2018-08-25 ENCOUNTER — Ambulatory Visit (HOSPITAL_COMMUNITY)
Admission: RE | Admit: 2018-08-25 | Discharge: 2018-08-25 | Disposition: A | Discharge status: Home / Self Care | Payer: Medicaid Other | Source: Ambulatory Visit | Attending: Obstetrics and Gynecology | Admitting: Obstetrics and Gynecology

## 2018-08-25 ENCOUNTER — Encounter (HOSPITAL_COMMUNITY): Payer: Self-pay

## 2018-08-25 ENCOUNTER — Ambulatory Visit (HOSPITAL_COMMUNITY): Admission: RE | Admit: 2018-08-25 | Payer: Medicaid Other | Source: Ambulatory Visit

## 2018-08-25 DIAGNOSIS — O99012 Anemia complicating pregnancy, second trimester: Secondary | ICD-10-CM

## 2018-08-25 DIAGNOSIS — Z363 Encounter for antenatal screening for malformations: Secondary | ICD-10-CM | POA: Diagnosis not present

## 2018-08-25 DIAGNOSIS — O99322 Drug use complicating pregnancy, second trimester: Secondary | ICD-10-CM | POA: Diagnosis not present

## 2018-08-25 DIAGNOSIS — Z3A14 14 weeks gestation of pregnancy: Secondary | ICD-10-CM | POA: Insufficient documentation

## 2018-08-25 DIAGNOSIS — D571 Sickle-cell disease without crisis: Secondary | ICD-10-CM

## 2018-08-25 LAB — SMN1 COPY NUMBER ANALYSIS (SMA CARRIER SCREENING)

## 2018-08-25 LAB — CYSTIC FIBROSIS GENE TEST

## 2018-08-25 LAB — OBSTETRIC PANEL, INCLUDING HIV
ANTIBODY SCREEN: NEGATIVE
BASOS: 0 %
Basophils Absolute: 0 10*3/uL (ref 0.0–0.2)
EOS (ABSOLUTE): 0.2 10*3/uL (ref 0.0–0.4)
Eos: 2 %
HEMATOCRIT: 23.9 % — AB (ref 34.0–46.6)
HIV Screen 4th Generation wRfx: NONREACTIVE
Hemoglobin: 8 g/dL — ABNORMAL LOW (ref 11.1–15.9)
Hepatitis B Surface Ag: NEGATIVE
Immature Grans (Abs): 0.1 10*3/uL (ref 0.0–0.1)
Immature Granulocytes: 1 %
LYMPHS ABS: 2.7 10*3/uL (ref 0.7–3.1)
Lymphs: 26 %
MCH: 30.3 pg (ref 26.6–33.0)
MCHC: 33.5 g/dL (ref 31.5–35.7)
MCV: 91 fL (ref 79–97)
MONOS ABS: 1.2 10*3/uL — AB (ref 0.1–0.9)
Monocytes: 11 %
NEUTROS ABS: 6.4 10*3/uL (ref 1.4–7.0)
Neutrophils: 60 %
PLATELETS: 607 10*3/uL — AB (ref 150–450)
RBC: 2.64 x10E6/uL — AB (ref 3.77–5.28)
RDW: 15.3 % (ref 12.3–15.4)
RPR Ser Ql: NONREACTIVE
Rh Factor: POSITIVE
Rubella Antibodies, IGG: 4.42 index (ref >0.99)
WBC: 10.6 10*3/uL (ref 3.4–10.8)

## 2018-08-28 ENCOUNTER — Encounter: Payer: Self-pay | Admitting: *Deleted

## 2018-08-28 ENCOUNTER — Other Ambulatory Visit (HOSPITAL_COMMUNITY): Payer: Self-pay | Admitting: *Deleted

## 2018-08-28 DIAGNOSIS — D571 Sickle-cell disease without crisis: Secondary | ICD-10-CM

## 2018-08-28 DIAGNOSIS — O99012 Anemia complicating pregnancy, second trimester: Principal | ICD-10-CM

## 2018-08-29 ENCOUNTER — Other Ambulatory Visit: Payer: Self-pay | Admitting: Obstetrics and Gynecology

## 2018-08-30 IMAGING — DX DG CHEST 2V
2 series · 2 of 2 positions shown · non-contrast
Comparison: Chest x-ray of 12/21/2016

CLINICAL DATA: Sickle cell crisis, chest pain for 1 day

EXAM:
CHEST  2 VIEW

[chest pa]
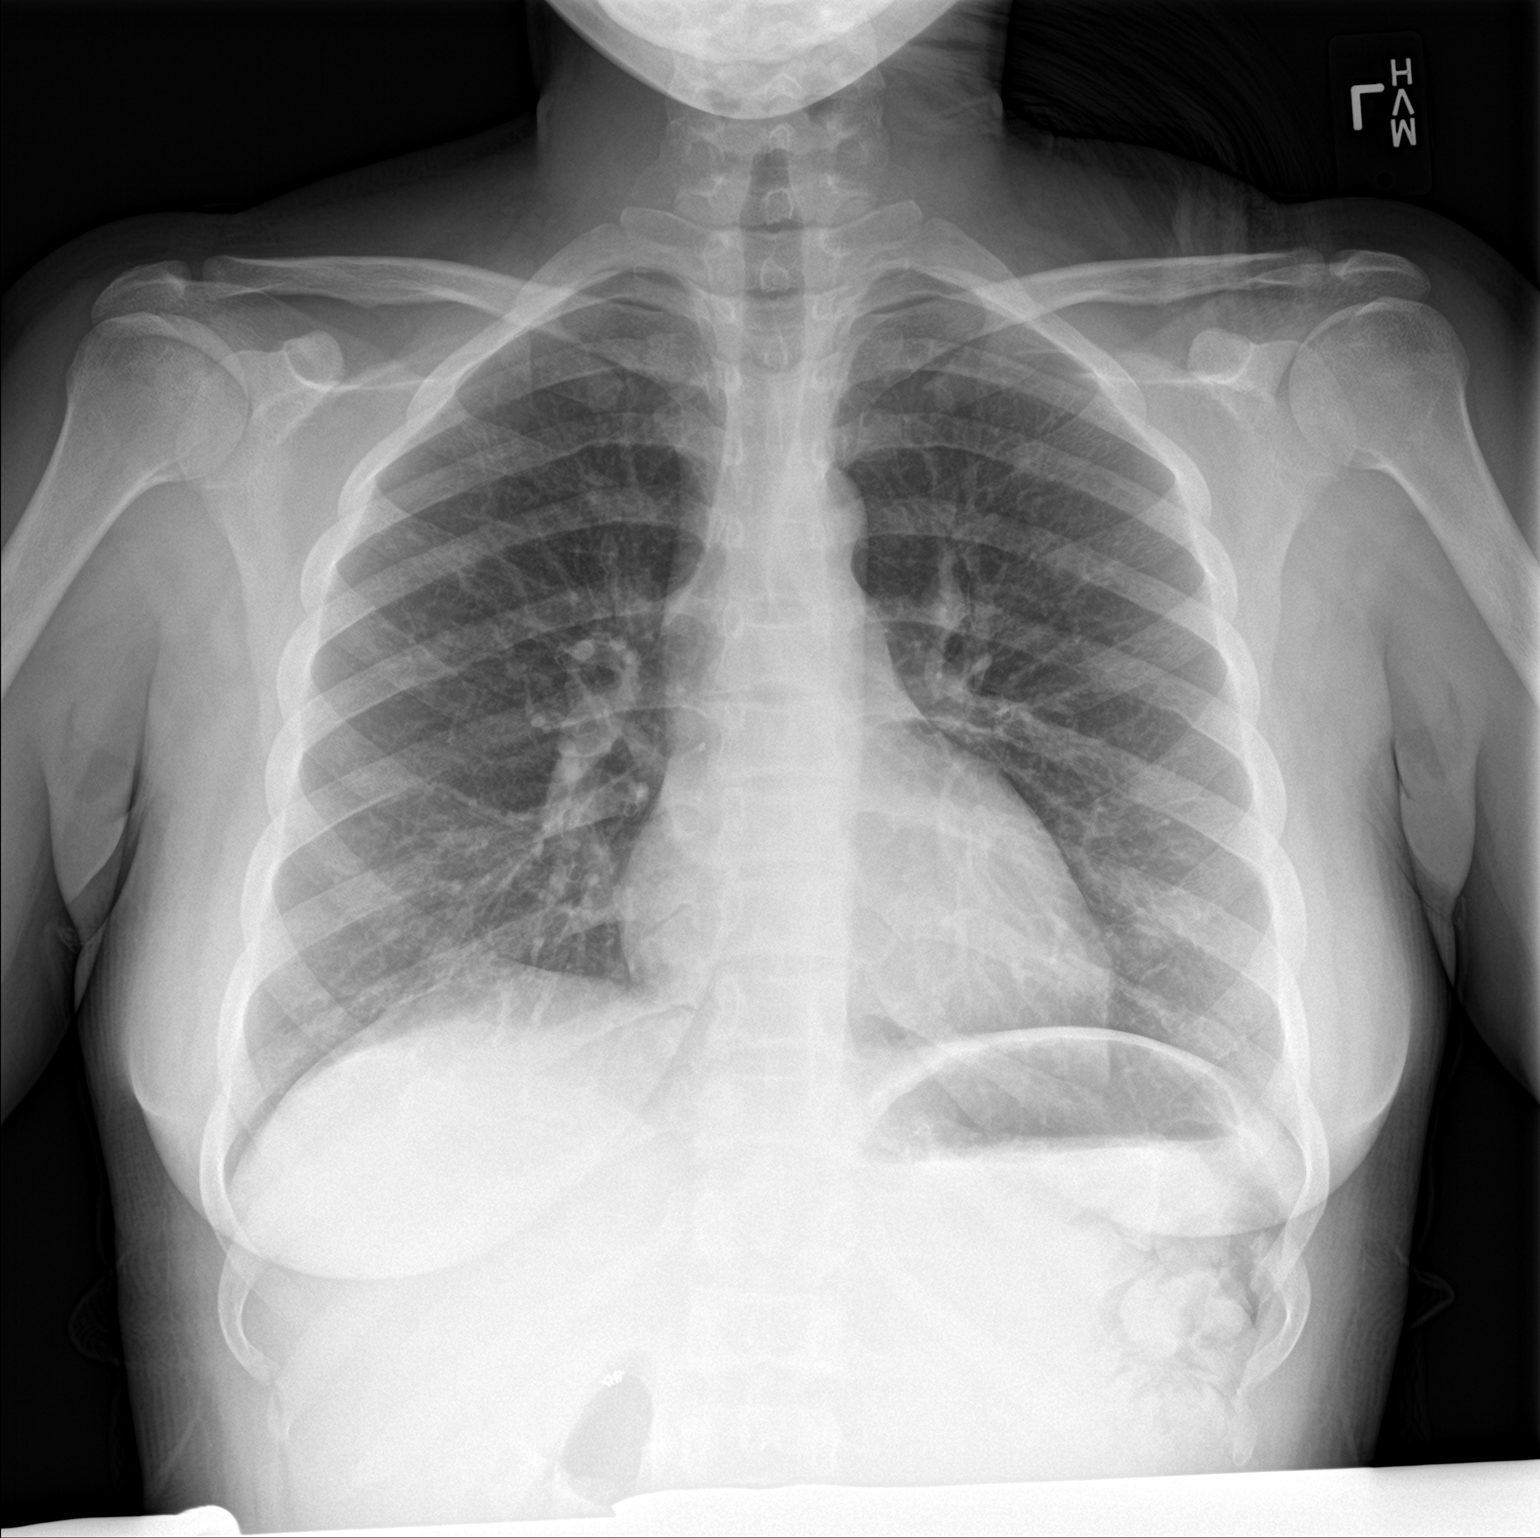

[chest lat]
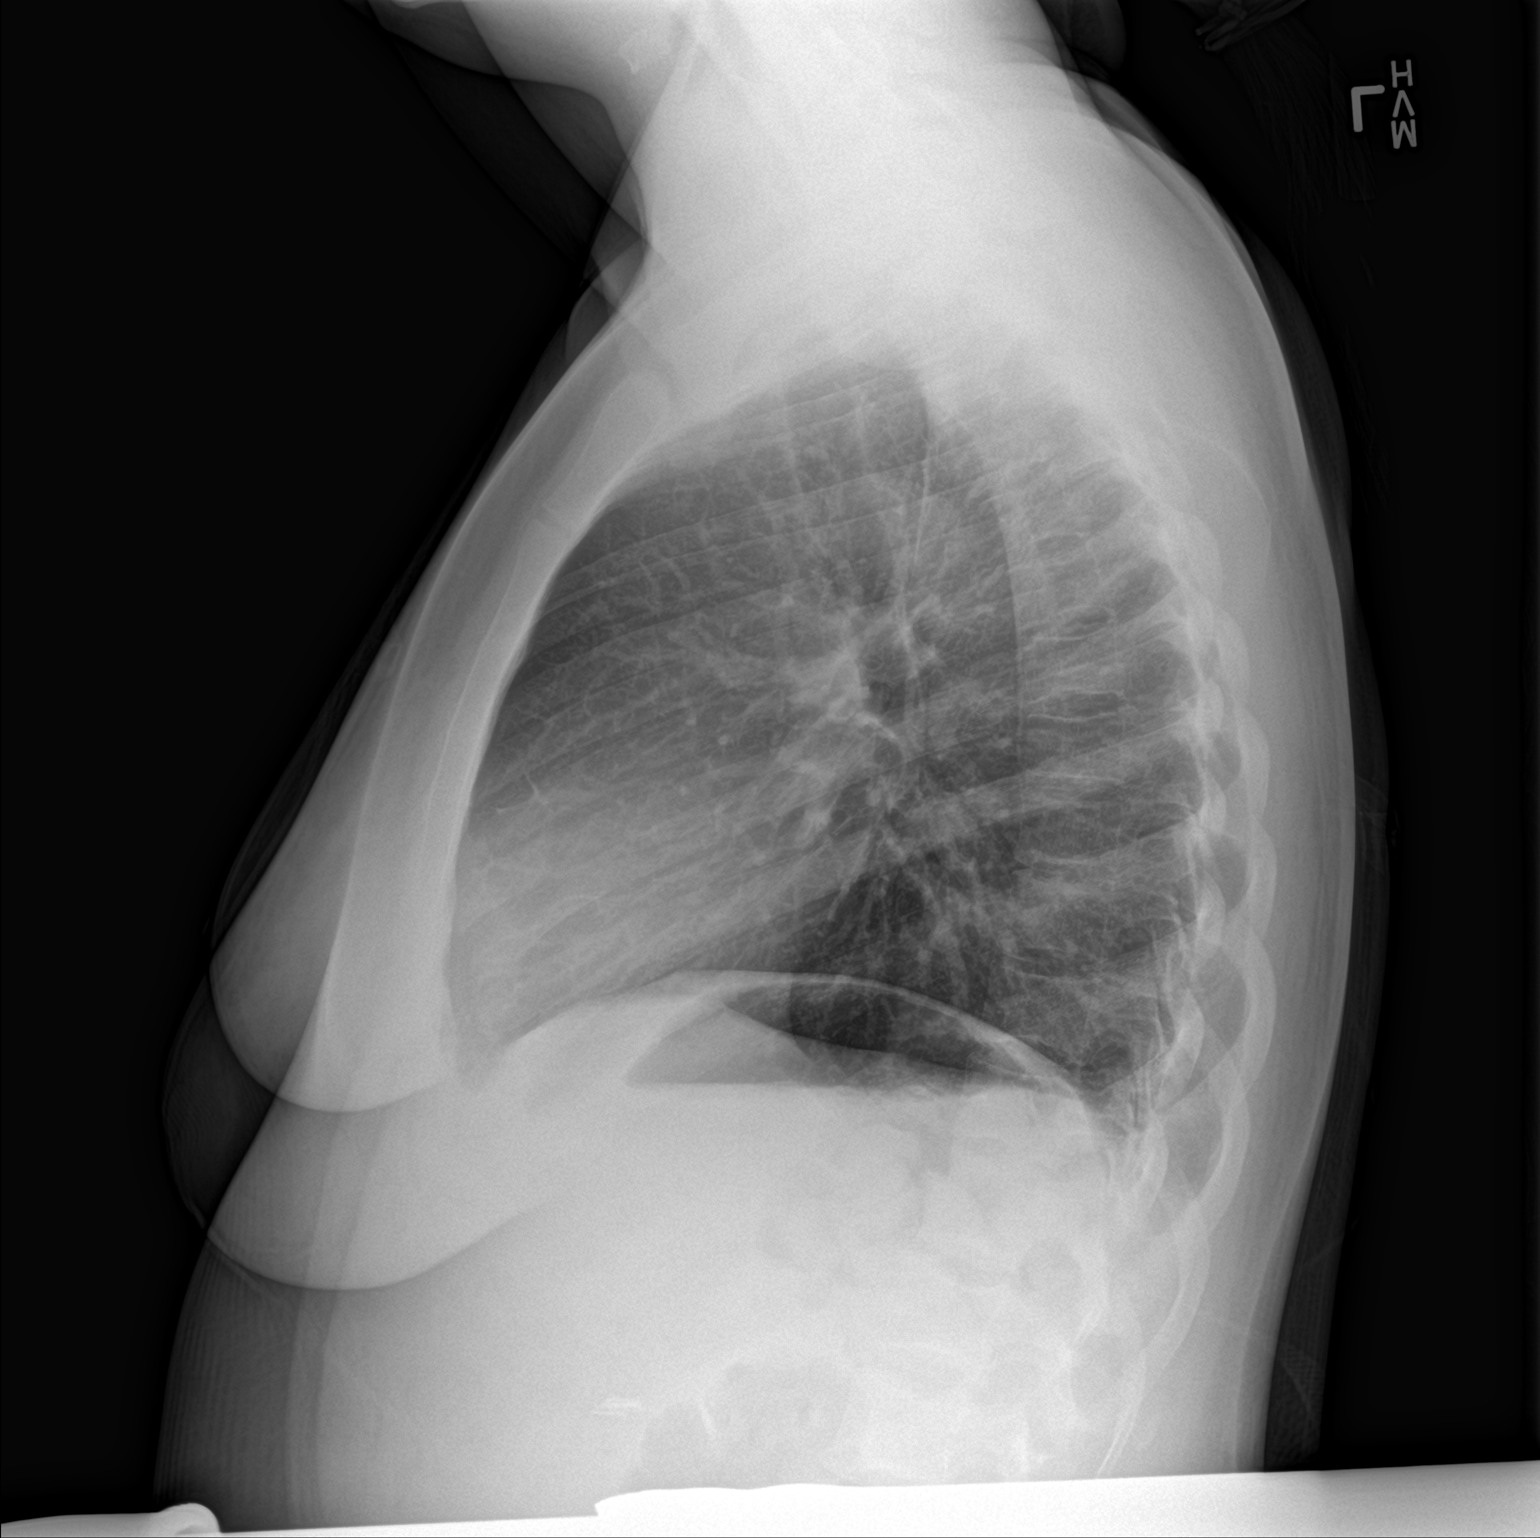

[2 of 2 positions shown; findings below may reference images not displayed]

FINDINGS: No active infiltrate or effusion is seen. Mediastinal and hilar
contours are unremarkable. The heart is within normal limits in
size. No bony abnormality is seen.
IMPRESSION: No active cardiopulmonary disease.

## 2018-09-06 ENCOUNTER — Encounter: Payer: Self-pay | Admitting: *Deleted

## 2018-09-06 ENCOUNTER — Telehealth: Payer: Self-pay

## 2018-09-06 ENCOUNTER — Telehealth: Payer: Self-pay | Admitting: *Deleted

## 2018-09-06 NOTE — Telephone Encounter (Signed)
Carmen [name], in  MFM states their MD will discuss with patient re: silent carrier for alpha thalassemia Friday while she is there for amniocentesis. They will also forward to the genetic counselor who talked with her.

## 2018-09-06 NOTE — Telephone Encounter (Signed)
Received [facility] carrier screening report showing + sickle cell and silent carrier for alpha thalassemia. Discussed with Dr.Ervin and ordered for MFM/ genetic consult. Per chart had genetic consult 10/ 23/19 - cannot verify + alpha thalassemia discussed . Called MFM to see if they can have genetic counselor to talk with her again . She has amniocentesis 09/08/18 but genetic counselor schedule is full. They will call me back with a plan.

## 2018-09-08 ENCOUNTER — Ambulatory Visit (HOSPITAL_COMMUNITY): Admission: RE | Admit: 2018-09-08 | Payer: No Typology Code available for payment source | Source: Ambulatory Visit

## 2018-09-08 ENCOUNTER — Ambulatory Visit (HOSPITAL_COMMUNITY): Payer: No Typology Code available for payment source

## 2018-09-08 ENCOUNTER — Other Ambulatory Visit: Payer: Self-pay | Admitting: Internal Medicine

## 2018-09-08 DIAGNOSIS — Z79891 Long term (current) use of opiate analgesic: Secondary | ICD-10-CM

## 2018-09-08 DIAGNOSIS — D571 Sickle-cell disease without crisis: Secondary | ICD-10-CM

## 2018-09-08 MED ORDER — OXYCODONE HCL 10 MG PO TABS: 10.0000 mg | ORAL_TABLET | ORAL | 0 refills | Status: DC | PRN

## 2018-09-08 NOTE — Telephone Encounter (Signed)
Refiiled ?

## 2018-09-13 ENCOUNTER — Non-Acute Institutional Stay (HOSPITAL_BASED_OUTPATIENT_CLINIC_OR_DEPARTMENT_OTHER)
Admission: AD | Admit: 2018-09-13 | Discharge: 2018-09-13 | Disposition: A | Discharge status: Home / Self Care | Payer: Medicaid Other | Source: Ambulatory Visit | Attending: Internal Medicine | Admitting: Internal Medicine

## 2018-09-13 ENCOUNTER — Emergency Department (HOSPITAL_COMMUNITY)
Admission: EM | Admit: 2018-09-13 | Discharge: 2018-09-13 | Disposition: A | Discharge status: Home / Self Care | Payer: Medicaid Other | Attending: Emergency Medicine | Admitting: Emergency Medicine

## 2018-09-13 ENCOUNTER — Encounter (HOSPITAL_COMMUNITY): Payer: Self-pay | Admitting: Emergency Medicine

## 2018-09-13 ENCOUNTER — Encounter (HOSPITAL_COMMUNITY): Payer: Self-pay | Admitting: *Deleted

## 2018-09-13 DIAGNOSIS — Z79899 Other long term (current) drug therapy: Secondary | ICD-10-CM | POA: Diagnosis not present

## 2018-09-13 DIAGNOSIS — Z7982 Long term (current) use of aspirin: Secondary | ICD-10-CM | POA: Insufficient documentation

## 2018-09-13 DIAGNOSIS — D5700 Hb-SS disease with crisis, unspecified: Secondary | ICD-10-CM | POA: Diagnosis present

## 2018-09-13 LAB — CBC WITH DIFFERENTIAL/PLATELET
ABS IMMATURE GRANULOCYTES: 0.07 10*3/uL (ref 0.00–0.07)
BASOS ABS: 0 10*3/uL (ref 0.0–0.1)
Basophils Relative: 0 %
EOS PCT: 1 %
Eosinophils Absolute: 0.1 10*3/uL (ref 0.0–0.5)
HEMATOCRIT: 23.1 % — AB (ref 36.0–46.0)
HEMOGLOBIN: 8.3 g/dL — AB (ref 12.0–15.0)
Immature Granulocytes: 1 %
LYMPHS PCT: 22 %
Lymphs Abs: 2.3 10*3/uL (ref 0.7–4.0)
MCH: 31.1 pg (ref 26.0–34.0)
MCHC: 35.9 g/dL (ref 30.0–36.0)
MCV: 86.5 fL (ref 80.0–100.0)
Monocytes Absolute: 1.2 10*3/uL — ABNORMAL HIGH (ref 0.1–1.0)
Monocytes Relative: 11 %
NEUTROS ABS: 6.8 10*3/uL (ref 1.7–7.7)
NRBC: 0.6 % — AB (ref 0.0–0.2)
Neutrophils Relative %: 65 %
Platelets: 563 10*3/uL — ABNORMAL HIGH (ref 150–400)
RBC: 2.67 MIL/uL — AB (ref 3.87–5.11)
RDW: 13.5 % (ref 11.5–15.5)
WBC: 10.5 10*3/uL (ref 4.0–10.5)

## 2018-09-13 LAB — COMPREHENSIVE METABOLIC PANEL
ALT: 38 U/L (ref 0–44)
ANION GAP: 9 (ref 5–15)
AST: 41 U/L (ref 15–41)
Albumin: 3.8 g/dL (ref 3.5–5.0)
Alkaline Phosphatase: 63 U/L (ref 38–126)
BUN: 8 mg/dL (ref 6–20)
CALCIUM: 9.4 mg/dL (ref 8.9–10.3)
CHLORIDE: 103 mmol/L (ref 98–111)
CO2: 22 mmol/L (ref 22–32)
Creatinine, Ser: 0.47 mg/dL (ref 0.44–1.00)
GFR calc non Af Amer: >60 mL/min (ref >60)
Glucose, Bld: 91 mg/dL (ref 70–99)
POTASSIUM: 3.8 mmol/L (ref 3.5–5.1)
Sodium: 134 mmol/L — ABNORMAL LOW (ref 135–145)
Total Bilirubin: 1.5 mg/dL — ABNORMAL HIGH (ref 0.3–1.2)
Total Protein: 8.4 g/dL — ABNORMAL HIGH (ref 6.5–8.1)

## 2018-09-13 LAB — RETICULOCYTES
Immature Retic Fract: 36.5 % — ABNORMAL HIGH (ref 2.3–15.9)
RBC.: 2.67 MIL/uL — AB (ref 3.87–5.11)
RETIC CT PCT: 6.8 % — AB (ref 0.4–3.1)
Retic Count, Absolute: 181.8 10*3/uL (ref 19.0–186.0)

## 2018-09-13 LAB — I-STAT BETA HCG BLOOD, ED (MC, WL, AP ONLY)

## 2018-09-13 MED ORDER — DIPHENHYDRAMINE HCL 25 MG PO CAPS
25.0000 mg | ORAL_CAPSULE | Freq: Four times a day (QID) | ORAL | Status: DC | PRN
  Administered 2018-09-13: 25 mg via ORAL
  Filled 2018-09-13: qty 1

## 2018-09-13 MED ORDER — ONDANSETRON HCL 4 MG/2ML IJ SOLN
4.0000 mg | Freq: Four times a day (QID) | INTRAMUSCULAR | Status: DC | PRN
  Administered 2018-09-13: 4 mg via INTRAVENOUS
  Filled 2018-09-13: qty 2

## 2018-09-13 MED ORDER — ONDANSETRON HCL 4 MG/2ML IJ SOLN
4.0000 mg | Freq: Once | INTRAMUSCULAR | Status: AC
  Administered 2018-09-13: 4 mg via INTRAVENOUS
  Filled 2018-09-13: qty 2

## 2018-09-13 MED ORDER — OXYCODONE HCL 5 MG PO TABS
10.0000 mg | ORAL_TABLET | Freq: Once | ORAL | Status: AC
  Administered 2018-09-13: 10 mg via ORAL
  Filled 2018-09-13: qty 2

## 2018-09-13 MED ORDER — ONDANSETRON HCL 4 MG/2ML IJ SOLN
4.0000 mg | INTRAMUSCULAR | Status: DC | PRN
  Administered 2018-09-13: 4 mg via INTRAVENOUS
  Filled 2018-09-13 (×2): qty 2

## 2018-09-13 MED ORDER — SODIUM CHLORIDE 0.9 % IV BOLUS
1000.0000 mL | Freq: Once | INTRAVENOUS | Status: AC
  Administered 2018-09-13: 1000 mL via INTRAVENOUS

## 2018-09-13 MED ORDER — HYDROMORPHONE HCL 1 MG/ML IJ SOLN
1.0000 mg | Freq: Once | INTRAMUSCULAR | Status: DC
  Filled 2018-09-13: qty 1

## 2018-09-13 MED ORDER — NALOXONE HCL 0.4 MG/ML IJ SOLN: 0.4000 mg | INTRAMUSCULAR | Status: DC | PRN

## 2018-09-13 MED ORDER — DEXTROSE-NACL 5-0.45 % IV SOLN
INTRAVENOUS | Status: DC
  Administered 2018-09-13: 10:00:00 via INTRAVENOUS

## 2018-09-13 MED ORDER — SODIUM CHLORIDE 0.9% FLUSH: 9.0000 mL | INTRAVENOUS | Status: DC | PRN

## 2018-09-13 MED ORDER — HYDROMORPHONE 1 MG/ML IV SOLN
INTRAVENOUS | Status: DC
  Administered 2018-09-13: 10.5 mg via INTRAVENOUS
  Administered 2018-09-13: 30 mg via INTRAVENOUS
  Filled 2018-09-13: qty 30

## 2018-09-13 MED ORDER — HYDROMORPHONE HCL 2 MG/ML IJ SOLN
2.0000 mg | Freq: Once | INTRAMUSCULAR | Status: AC
  Administered 2018-09-13: 2 mg via INTRAVENOUS
  Filled 2018-09-13: qty 1

## 2018-09-13 NOTE — ED Triage Notes (Signed)
Patient here from home with complaints of sickle cell pain crisis. Home meds at 2am with no relief.

## 2018-09-13 NOTE — Discharge Summary (Signed)
[facility] Discharge Summary   Patient ID: Carmen [name] MRN: [id number] DOB/AGE: May 24, 1991 27 y.o.  Admit date: 09/13/2018 Discharge date: 09/13/2018  Primary Care Physician:  Lanae Boast, [place]  Admission Diagnoses:  Active Problems:   Sickle cell crisis [facility])  Discharge Medications:  Allergies as of 09/13/2018   No Known Allergies     Medication List    TAKE these medications   aspirin EC 81 MG tablet Take 1 tablet (81 mg total) by mouth daily.   docusate sodium 50 MG capsule Commonly known as:  COLACE Take 1 capsule (50 mg total) by mouth 2 (two) times daily as needed for mild constipation.   Doxylamine-Pyridoxine 10-10 MG Tbec Take 2 tablets by mouth at bedtime. 2 tabs po QHS x 2 nights. If symptoms persist then 1 tab po Q AM and 2 tabs QHS. Give on empty stomach   ergocalciferol 1.25 MG (50000 UT) capsule Commonly known as:  VITAMIN D2 Take 1 capsule (50,000 Units total) by mouth once a week.   folic acid 1 MG tablet Commonly known as:  FOLVITE Take 1 tablet (1 mg total) by mouth daily.   Oxycodone HCl 10 MG Tabs Take 1 tablet (10 mg total) by mouth every 4 (four) hours as needed for up to 15 days (PAIN).   prenatal multivitamin Tabs tablet Take 1 tablet by mouth daily at 12 noon.   PREPLUS 27-1 MG Tabs Take 1 tablet by mouth daily.        Consults:  None  Significant Diagnostic Studies:  [place] Mfm Ob Detail +14 Wk  Result Date: 08/25/2018 ----------------------------------------------------------------------  OBSTETRICS REPORT                       (Signed Final 08/25/2018 11:26 am) ---------------------------------------------------------------------- Patient Info  ID #:       [id number]                          D.O.B.:  [birth year] (27 yrs)  Name:       Carmen [name]                  Visit Date: 08/25/2018 09:42 am ---------------------------------------------------------------------- Performed By  Performed By:     Elisabeth Cara         Ref. Address:     [facility]                                                             [address]  [place], [place]  Attending:        Tama High MD        Location:         [facility]  Referred By:      [facility] for                    [place] ---------------------------------------------------------------------- Orders   #  Description                          Code         Ordered By   1  [place] MFM OB DETAIL +14 [place]              [id number]     Tama High  ----------------------------------------------------------------------   #  Order #                    Accession #                 Episode #   1  [id number]                  [id number]                  [id number]  ---------------------------------------------------------------------- Indications   Encounter for antenatal screening for          Z36.3   malformations   Encounter for uncertain dates                  Z36.87   Maternal sickle cell anemia, second trimester  O99.012, R04.1   Drug use complicating pregnancy, second        O99.322   trimester (chronic opiate use)   [redacted] weeks gestation of pregnancy                Z3A.14  ---------------------------------------------------------------------- Fetal Evaluation  Num Of Fetuses:         1  Fetal Heart Rate(bpm):  167  Cardiac Activity:       Observed  Presentation:           Variable  Placenta:               Posterior  P. Cord Insertion:      Visualized  Amniotic Fluid  AFI FV:      Within normal limits  Largest Pocket(cm)                              3.47 ----------------------------------------------------------------------  Biometry  BPD:      28.4  mm     G. Age:  15w 1d                  CI:         74.8   %    70 - 86                                                          FL/HC:      12.4   %    15.3 - 17.1  HC:      104.2  mm     G. Age:  14w 6d                  HC/AC:      1.16        1.05 - 1.39  AC:       90.2  mm     G. Age:  15w 2d                  FL/BPD:     45.4   %  FL:       12.9  mm     G. Age:  13w 5d                  FL/AC:      14.3   %    20 - 24  HUM:      15.5  mm     G. Age:  14w 2d  Est. FW:     101  gm      0 lb 4 oz ---------------------------------------------------------------------- OB History  Gravidity:    2  TOP:          1 ---------------------------------------------------------------------- Gestational Age  LMP:           14w 4d        Date:  05/15/18                 EDD:   02/19/19  U/S Today:     14w 5d                                        EDD:   02/18/19  Best:          14w 4d     Det. By:  LMP  (05/15/18)          EDD:   02/19/19 ---------------------------------------------------------------------- Anatomy  Cranium:               Appears normal         Aortic Arch:            Not well visualized  Cavum:                 Not well visualized    Ductal Arch:            Not well visualized  Ventricles:  Not well visualized    Diaphragm:              Not well visualized  Cerebellum:            Not well visualized    Stomach:                Appears normal, left                                                                        sided  Posterior Fossa:       Not well visualized    Abdominal Wall:         Not well visualized  Nuchal Fold:           Not well visualized    Cord Vessels:           Appears normal (3                                                                        vessel cord)  Face:                  Not well visualized    Kidneys:                Appear normal  Lips:                  Not well visualized    Bladder:                Appears normal  Thoracic:               Appears normal         Spine:                  Not well visualized  Heart:                 Not well visualized    Upper Extremities:      Visualized  RVOT:                  Not well visualized    Lower Extremities:      Visualized  LVOT:                  Not well visualized  Other:  Technically difficult due to early gestational age. Technically difficult          due to fetal position. ---------------------------------------------------------------------- Cervix Uterus Adnexa  Cervix  Normal appearance by transabdominal scan.  Uterus  No abnormality visualized.  Left Ovary  Not visualized.  Right Ovary  Not visualized.  Adnexa  No abnormality visualized. No adnexal mass  visualized. ---------------------------------------------------------------------- Impression  Carmen [name] is here for dating scan. She has sickle-cell  disease (Hb-SS) and had genetic counseling early this week.  After counseling, the patient opted for amniocentesis.  Her partner does not have sickle-cell trait. Patient is aware  that if he  does not carry the trait (AS), the fetus is unlikely to  have sickle-cell disease. Patient informed that she would like  to have amniocentesis to determine the fetal karyotype also.  On ultrasound, amniotic fluid is normal and good fetal activity  is seen. Fetal biometry is consistent with her previously-  established dates (LMP). Fetal anatomical survey is very  limited because of early gestational age.  I explained amniocentesis procedure and its possible  complication of miscarriage (1 in 400 procedures). We will  perform amniocentesis at 16 weeks.  Patient has rhesus positive blood group.  She will have her rest of blood drawn (advised by genetic  counselor) on her next visit with maternal contamination  studies. ---------------------------------------------------------------------- Recommendations  -An appointment was made for her to return in 2 weeks for  amniocentesis.  -Complete fetal anatomy scan in 6  weeks. ----------------------------------------------------------------------                  Tama High, MD Electronically Signed Final Report   08/25/2018 11:26 am ----------------------------------------------------------------------    [facility] Course: Leela Vanbrocklin, a 27 year old female was admitted for pain management and extended observation. Patient transitioned from the emergency department.  Reviewed labs, consistent with baseline.   Hypotonic IV fluids initiated.  Tylenol 500 mg times 1 Zofran 4 mg IV times 2  Dilaudid PCA per weight based protocol for pain management. Patient used a total of 10.5 mg with 21 demands and 21 delivered. Pain intensity decreased to 5/10. Patient alert, oriented, and ambulating. She will discharge home with family in stable condition.   Discharge instructions;   Resume all home medications Follow up with obstetrician as scheduled Recommend 64 ounces of water per day  The patient was given clear instructions to go to ER or return to medical center if symptoms do not improve, worsen or new problems develop. The patient verbalized understanding.   Avoid all stressors that precipitate sickle cell crisis.    Physical Exam at Discharge:  BP (!) 101/57 (BP Location: Left Arm)   Pulse 62   Temp 98.5 F (36.9 C) (Oral)   Resp 17   LMP 05/15/2018 (Approximate)   SpO2 100%  Physical Exam  Constitutional: She appears well-developed and well-nourished.  HENT:  Head: Normocephalic and atraumatic.  Eyes: Pupils are equal, round, and reactive to light.  Neck: Normal range of motion.  Cardiovascular: Normal rate, regular rhythm and normal heart sounds.  Pulmonary/Chest: Effort normal and breath sounds normal.  Abdominal: Soft. Bowel sounds are normal.  Musculoskeletal: Normal range of motion.  Neurological: She is alert.  Skin: Skin is warm and dry.  Psychiatric: She has a normal mood and affect. Her behavior is normal. Judgment  and thought content normal.   Disposition at Discharge: Discharge disposition: 01-Home or Self Care       Discharge Orders: Discharge Instructions    Discharge patient   Complete by:  As directed    Discharge disposition:  01-Home or Self Care   Discharge patient date:  09/13/2018      Condition at Discharge:   Stable  Time spent on Discharge:  20 minutes Signed:  [name]  APRN, MSN, FNP-C [facility] [address] [place], [place] [address] [phone]  09/13/2018, 4:20 PM

## 2018-09-13 NOTE — ED Notes (Signed)
[facility] has arrived to pick patient up.

## 2018-09-13 NOTE — ED Notes (Signed)
RN has checked with Secretary. [facility] will call and transport patient to Clinic.

## 2018-09-13 NOTE — ED Provider Notes (Signed)
[facility] DEPT Provider Note   CSN: [id number] Arrival date & time: 09/13/18  0701     History   Chief Complaint Chief Complaint  Patient presents with  . Sickle Cell Pain Crisis    HPI Carmen [name] is a 27 y.o. female.  Patient complains of pain throughout her body.  Patient complains of most of her pain in her arms and legs. patient states she is having a sickle crisis  The history is provided by the patient.  Sickle Cell Pain Crisis  Location:  Upper extremity and lower extremity Severity:  Moderate Onset quality:  Sudden Timing:  Constant Progression:  Waxing and waning Chronicity:  New Sickle cell genotype:  SS History of pulmonary emboli: no   Context: not alcohol consumption   Relieved by:  Nothing Worsened by:  Nothing Ineffective treatments:  None tried Associated symptoms: no chest pain, no congestion, no cough, no fatigue and no headaches   Risk factors: no cholecystectomy     Past Medical History:  Diagnosis Date  . Abortion    05/2012  . Headache(784.0)   . Sickle cell crisis [facility])     Patient Active Problem List   Diagnosis Date Noted  . Supervision of high risk pregnancy, antepartum 08/17/2018  . Chronic prescription opiate use 03/13/2018  . Chronic musculoskeletal pain 03/13/2018  . Vitamin D deficiency 01/13/2018  . 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 Hb-SS disease without crisis ([place]) 08/15/2016  . Anemia of chronic disease   . S/P laparoscopic cholecystectomy 11/30/2014  . Thrombocytosis ([place]) 11/22/2014  . Systolic ejection murmur 31/49/7026    Past Surgical History:  Procedure Laterality Date  . CHOLECYSTECTOMY N/A 11/30/2014   Procedure: LAPAROSCOPIC CHOLECYSTECTOMY SINGLE SITE WITH INTRAOPERATIVE CHOLANGIOGRAM;  Surgeon: Michael Boston, MD;  Location: [facility] ORS;  Service: General;  Laterality: N/A;  . SPLENECTOMY       OB History    Gravida  2   Para      Term      Preterm      AB  1   Living        SAB      TAB  1   Ectopic      Multiple      Live Births               Home Medications    Prior to Admission medications   Medication Sig Start Date End Date Taking? Authorizing Provider  aspirin EC 81 MG tablet Take 1 tablet (81 mg total) by mouth daily. 08/17/18   [name], MD  docusate sodium (COLACE) 50 MG capsule Take 1 capsule (50 mg total) by mouth 2 (two) times daily as needed for mild constipation. 08/17/18 09/16/18  Lanae Boast, FNP  Doxylamine-Pyridoxine 10-10 MG TBEC Take 2 tablets by mouth at bedtime. 2 tabs po QHS x 2 nights. If symptoms persist then 1 tab po Q AM and 2 tabs QHS. Give on empty stomach Patient not taking: Reported on 08/17/2018 07/20/18   Lanae Boast, FNP  ergocalciferol (VITAMIN D2) 50000 units capsule Take 1 capsule (50,000 Units total) by mouth once a week. 05/22/18   Tresa Garter, MD  folic acid (FOLVITE) 1 MG tablet Take 1 tablet (1 mg total) by mouth daily. 08/17/18   [name], MD  Oxycodone HCl 10 MG TABS Take 1 tablet (10 mg total) by mouth every 4 (four) hours as needed for up to  15 days (PAIN). 09/08/18 09/23/18  Tresa Garter, MD  Prenatal Vit-Fe Fumarate-FA (PRENATAL MULTIVITAMIN) TABS tablet Take 1 tablet by mouth daily at 12 noon.    [provider]  Prenatal Vit-Fe Fumarate-FA (PREPLUS) 27-1 MG TABS Take 1 tablet by mouth daily. 08/17/18   [name], MD    Family History Family History  Problem Relation Age of Onset  . Hypertension Mother   . Sickle cell anemia Sister   . Kidney disease Sister        Lupus  . Arthritis Sister   . Sickle cell anemia Sister   . Sickle cell trait Sister   . Heart disease Maternal Aunt        CABG  . Heart disease Maternal Uncle        CABG  . Lupus Sister     Social History Social History   Tobacco Use  . Smoking status: Never Smoker  . Smokeless tobacco: Never Used  Substance Use Topics  .  Alcohol use: No  . Drug use: No     Allergies   Patient has no known allergies.   Review of Systems Review of Systems  Constitutional: Negative for appetite change and fatigue.  HENT: Negative for congestion, ear discharge and sinus pressure.   Eyes: Negative for discharge.  Respiratory: Negative for cough.   Cardiovascular: Negative for chest pain.  Gastrointestinal: Negative for abdominal pain and diarrhea.  Genitourinary: Negative for frequency and hematuria.  Musculoskeletal: Negative for back pain.  Skin: Negative for rash.  Neurological: Negative for seizures and headaches.  Psychiatric/Behavioral: Negative for hallucinations.     Physical Exam Updated Vital Signs BP 114/75   Pulse 86   Temp 97.9 F (36.6 C) (Oral)   Resp 13   LMP 05/15/2018 (Approximate)   SpO2 99%   Physical Exam  Constitutional: She is oriented to person, place, and time. She appears well-developed.  HENT:  Head: Normocephalic.  Eyes: Conjunctivae and EOM are normal. No scleral icterus.  Neck: Neck supple. No thyromegaly present.  Cardiovascular: Normal rate and regular rhythm. Exam reveals no gallop and no friction rub.  No murmur heard. Pulmonary/Chest: No stridor. She has no wheezes. She has no rales. She exhibits no tenderness.  Abdominal: She exhibits no distension. There is no tenderness. There is no rebound.  Musculoskeletal: Normal range of motion. She exhibits no edema.  Lymphadenopathy:    She has no cervical adenopathy.  Neurological: She is oriented to person, place, and time. She exhibits normal muscle tone. Coordination normal.  Skin: No rash noted. No erythema.  Psychiatric: She has a normal mood and affect. Her behavior is normal.     ED Treatments / Results  Labs (all labs ordered are listed, but only abnormal results are displayed) Labs Reviewed  CBC WITH DIFFERENTIAL/PLATELET - Abnormal; Notable for the following components:      Result Value   RBC 2.67 (*)     Hemoglobin 8.3 (*)    HCT 23.1 (*)    Platelets 563 (*)    nRBC 0.6 (*)    Monocytes Absolute 1.2 (*)    All other components within normal limits  RETICULOCYTES - Abnormal; Notable for the following components:   Retic Ct Pct 6.8 (*)    RBC. 2.67 (*)    Immature Retic Fract 36.5 (*)    All other components within normal limits  I-STAT BETA HCG BLOOD, ED ([facility], [facility], [facility] ONLY) - Abnormal; Notable for the following components:  I-stat hCG, quantitative >2,000.0 (*)    All other components within normal limits  COMPREHENSIVE METABOLIC PANEL    EKG None  Radiology No results found.  Procedures Procedures (including critical care time)  Medications Ordered in ED Medications  sodium chloride 0.9 % bolus 1,000 mL (1,000 mLs Intravenous New Bag/Given 09/13/18 0800)  ondansetron (ZOFRAN) injection 4 mg (4 mg Intravenous Given 09/13/18 0756)  HYDROmorphone (DILAUDID) injection 2 mg (2 mg Intravenous Given 09/13/18 0802)     Initial Impression / Assessment and Plan / ED Course  I have reviewed the triage vital signs and the nursing notes.  Pertinent labs & imaging results that were available during my care of the patient were reviewed by me and considered in my medical decision making (see chart for details).     Sickle cell crisis.  Patient will be sent to sickle clinic  Final Clinical Impressions(s) / ED Diagnoses   Final diagnoses:  Sickle cell pain crisis [facility])    ED Discharge Orders    None       Milton Ferguson, MD 09/13/18 [phone]

## 2018-09-13 NOTE — Discharge Instructions (Signed)
Sickle Cell Anemia, Adult °Sickle cell anemia is a condition where your red blood cells are shaped like sickles. Red blood cells carry oxygen through the body. Sickle-shaped red blood cells do not live as long as normal red blood cells. They also clump together and block blood from flowing through the blood vessels. These things prevent the body from getting enough oxygen. Sickle cell anemia causes organ damage and pain. It also increases the risk of infection. °Follow these instructions at home: °· Drink enough fluid to keep your pee (urine) clear or pale yellow. Drink more in hot weather and during exercise. °· Do not smoke. Smoking lowers oxygen levels in the blood. °· Only take over-the-counter or prescription medicines as told by your doctor. °· Take antibiotic medicines as told by your doctor. Make sure you finish them even if you start to feel better. °· Take supplements as told by your doctor. °· Consider wearing a medical alert bracelet. This tells anyone caring for you in an emergency of your condition. °· When traveling, keep your medical information, doctors' names, and the medicines you take with you at all times. °· If you have a fever, do not take fever medicines right away. This could cover up a problem. Tell your doctor. °· Keep all follow-up visits with your doctor. Sickle cell anemia requires regular medical care. °Contact a doctor if: °You have a fever. °Get help right away if: °· You feel dizzy or faint. °· You have new belly (abdominal) pain, especially on the left side near the stomach area. °· You have a lasting, often uncomfortable and painful erection of the penis (priapism). If it is not treated right away, you will become unable to have sex (impotence). °· You have numbness in your arms or legs or you have a hard time moving them. °· You have a hard time talking. °· You have a fever or lasting symptoms for more than 2-3 days. °· You have a fever and your symptoms suddenly get  worse. °· You have signs or symptoms of infection. These include: °? Chills. °? Being more tired than normal (lethargy). °? Irritability. °? Poor eating. °? Throwing up (vomiting). °· You have pain that is not helped with medicine. °· You have shortness of breath. °· You have pain in your chest. °· You are coughing up pus-like or bloody mucus. °· You have a stiff neck. °· Your feet or hands swell or have pain. °· Your belly looks bloated. °· Your joints hurt. °This information is not intended to replace advice given to you by your health care provider. Make sure you discuss any questions you have with your health care provider. °Document Released: 08/08/2013 Document Revised: 03/25/2016 Document Reviewed: 05/30/2013 °Elsevier Interactive Patient Education © 2017 [employer] ° °

## 2018-09-13 NOTE — H&P (Signed)
[facility] History and Physical   Date: 09/13/2018  Patient name: Carmen [name] Medical record number: [id number] Date of birth: 1991-09-01 Age: 27 y.o. Gender: female PCP: Lanae Boast, [place]  Attending physician: Tresa Garter, MD  Chief Complaint: Generalized pain  History of Present Illness: Carmen [name], a 27 year old female with a history of sickle cell anemia, chronic pain syndrome and second trimester pregnancy presents complaining of generalized pain consistent with typical sickle cell crisis.  Patient was treated and evaluated in the emergency department this a.m. for this problem.  She received Dilaudid 2 mg IV x1 without sustained relief.  Agree with emergency room provider that patient is appropriate to transition to sickle cell day infusion center for pain management and extended observation.  On arrival, patient's pain intensity is 10/10 primarily to upper extremities and low back.  Patient states that pain intensity increased 2 days ago and she has been taking oxycodone 10 mg as prescribed without relief.  Patient is currently [redacted] weeks pregnant and is followed by obstetrician consistently. She currently denies headache, chest pain, shortness of breath, dysuria, nausea, vomiting, or diarrhea.  Meds: Medications Prior to Admission  Medication Sig Dispense Refill Last Dose  . aspirin EC 81 MG tablet Take 1 tablet (81 mg total) by mouth daily. 90 tablet 3 Taking  . docusate sodium (COLACE) 50 MG capsule Take 1 capsule (50 mg total) by mouth 2 (two) times daily as needed for mild constipation. 60 capsule 1 Taking  . Doxylamine-Pyridoxine 10-10 MG TBEC Take 2 tablets by mouth at bedtime. 2 tabs po QHS x 2 nights. If symptoms persist then 1 tab po Q AM and 2 tabs QHS. Give on empty stomach (Patient not taking: Reported on 08/17/2018) 60 tablet 0 Not Taking at Unknown time  . ergocalciferol (VITAMIN D2) 50000 units capsule Take 1 capsule (50,000 Units total) by  mouth once a week. 30 capsule 1 Taking  . folic acid (FOLVITE) 1 MG tablet Take 1 tablet (1 mg total) by mouth daily. 90 tablet 3 Taking  . Oxycodone HCl 10 MG TABS Take 1 tablet (10 mg total) by mouth every 4 (four) hours as needed for up to 15 days (PAIN). 45 tablet 0   . Prenatal Vit-Fe Fumarate-FA (PRENATAL MULTIVITAMIN) TABS tablet Take 1 tablet by mouth daily at 12 noon.   Taking  . Prenatal Vit-Fe Fumarate-FA (PREPLUS) 27-1 MG TABS Take 1 tablet by mouth daily. 30 tablet 13 Taking    Allergies: Patient has no known allergies. Past Medical History:  Diagnosis Date  . Abortion    05/2012  . Headache(784.0)   . Sickle cell crisis [facility])    Past Surgical History:  Procedure Laterality Date  . CHOLECYSTECTOMY N/A 11/30/2014   Procedure: LAPAROSCOPIC CHOLECYSTECTOMY SINGLE SITE WITH INTRAOPERATIVE CHOLANGIOGRAM;  Surgeon: Michael Boston, MD;  Location: [facility] ORS;  Service: General;  Laterality: N/A;  . SPLENECTOMY     Family History  Problem Relation Age of Onset  . Hypertension Mother   . Sickle cell anemia Sister   . Kidney disease Sister        Lupus  . Arthritis Sister   . Sickle cell anemia Sister   . Sickle cell trait Sister   . Heart disease Maternal Aunt        CABG  . Heart disease Maternal Uncle        CABG  . Lupus Sister    Social History   Socioeconomic History  .  Marital status: Single    Spouse name: Not on file  . Number of children: Not on file  . Years of education: Not on file  . Highest education level: Not on file  Occupational History  . Not on file  Social Needs  . Financial resource strain: Not on file  . Food insecurity:    Worry: Not on file    Inability: Not on file  . Transportation needs:    Medical: Not on file    Non-medical: Not on file  Tobacco Use  . Smoking status: Never Smoker  . Smokeless tobacco: Never Used  Substance and Sexual Activity  . Alcohol use: No  . Drug use: No  . Sexual activity: Yes    Birth control/protection:  None  Lifestyle  . Physical activity:    Days per week: Not on file    Minutes per session: Not on file  . Stress: Not on file  Relationships  . Social connections:    Talks on phone: Not on file    Gets together: Not on file    Attends religious service: Not on file    Active member of club or organization: Not on file    Attends meetings of clubs or organizations: Not on file    Relationship status: Not on file  . Intimate partner violence:    Fear of current or ex partner: Not on file    Emotionally abused: Not on file    Physically abused: Not on file    Forced sexual activity: Not on file  Other Topics Concern  . Not on file  Social History Narrative  . Not on file   Review of Systems  Constitutional: Negative.   HENT: Negative.   Respiratory: Negative.   Cardiovascular: Negative for chest pain.  Gastrointestinal: Negative.   Genitourinary: Negative.   Musculoskeletal: Positive for back pain and myalgias.  Skin: Negative.   Neurological: Negative.   Endo/Heme/Allergies: Negative.   Psychiatric/Behavioral: Negative.      Physical Exam: Blood pressure (!) 106/51, pulse 94, temperature 98.3 F (36.8 C), temperature source Oral, resp. rate 17, last menstrual period 05/15/2018, SpO2 100 %. Physical Exam  Constitutional: She is oriented to person, place, and time. She appears well-developed.  Eyes: Pupils are equal, round, and reactive to light.  Neck: Normal range of motion.  Cardiovascular: Normal rate, regular rhythm, normal heart sounds and intact distal pulses.  Pulmonary/Chest: Effort normal and breath sounds normal.  Abdominal: Soft. Bowel sounds are normal.  Neurological: She is alert and oriented to person, place, and time.  Skin: Skin is warm and dry.  Psychiatric: She has a normal mood and affect. Her behavior is normal. Judgment and thought content normal.    Lab results: Results for orders placed or performed during the hospital encounter of 09/13/18  (from the past 24 hour(s))  Comprehensive metabolic panel     Status: Abnormal   Collection Time: 09/13/18  7:54 AM  Result Value Ref Range   Sodium 134 (L) 135 - 145 mmol/L   Potassium 3.8 3.5 - 5.1 mmol/L   Chloride 103 98 - 111 mmol/L   CO2 22 22 - 32 mmol/L   Glucose, Bld 91 70 - 99 mg/dL   BUN 8 6 - 20 mg/dL   Creatinine, Ser 0.47 0.44 - 1.00 mg/dL   Calcium 9.4 8.9 - 10.3 mg/dL   Total Protein 8.4 (H) 6.5 - 8.1 g/dL   Albumin 3.8 3.5 - 5.0 g/dL  AST 41 15 - 41 U/L   ALT 38 0 - 44 U/L   Alkaline Phosphatase 63 38 - 126 U/L   Total Bilirubin 1.5 (H) 0.3 - 1.2 mg/dL   GFR calc non Af Amer >60 >60 mL/min   GFR calc Af Amer >60 >60 mL/min   Anion gap 9 5 - 15  CBC with Differential     Status: Abnormal   Collection Time: 09/13/18  7:54 AM  Result Value Ref Range   WBC 10.5 4.0 - 10.5 K/uL   RBC 2.67 (L) 3.87 - 5.11 MIL/uL   Hemoglobin 8.3 (L) 12.0 - 15.0 g/dL   HCT 23.1 (L) 36.0 - 46.0 %   MCV 86.5 80.0 - 100.0 fL   MCH 31.1 26.0 - 34.0 pg   MCHC 35.9 30.0 - 36.0 g/dL   RDW 13.5 11.5 - 15.5 %   Platelets 563 (H) 150 - 400 K/uL   nRBC 0.6 (H) 0.0 - 0.2 %   Neutrophils Relative % 65 %   Neutro Abs 6.8 1.7 - 7.7 K/uL   Lymphocytes Relative 22 %   Lymphs Abs 2.3 0.7 - 4.0 K/uL   Monocytes Relative 11 %   Monocytes Absolute 1.2 (H) 0.1 - 1.0 K/uL   Eosinophils Relative 1 %   Eosinophils Absolute 0.1 0.0 - 0.5 K/uL   Basophils Relative 0 %   Basophils Absolute 0.0 0.0 - 0.1 K/uL   Immature Granulocytes 1 %   Abs Immature Granulocytes 0.07 0.00 - 0.07 K/uL  Reticulocytes     Status: Abnormal   Collection Time: 09/13/18  7:54 AM  Result Value Ref Range   Retic Ct Pct 6.8 (H) 0.4 - 3.1 %   RBC. 2.67 (L) 3.87 - 5.11 MIL/uL   Retic Count, Absolute 181.8 19.0 - 186.0 K/uL   Immature Retic Fract 36.5 (H) 2.3 - 15.9 %  I-Stat beta hCG blood, ED     Status: Abnormal   Collection Time: 09/13/18  8:00 AM  Result Value Ref Range   I-stat hCG, quantitative >2,000.0 (H) <5  mIU/mL   Comment 3            Imaging results:  No results found.   Assessment & Plan:  Patient will be admitted to the day infusion center for extended observation  Start IV D5.45 for cellular rehydration at 125/hr  Patient [redacted] weeks pregnant, hold Toradol  Start Dilaudid PCA High Concentration per weight based protocol.   Patient will be re-evaluated for pain intensity in the context of function and relationship to baseline as care progresses.  If no significant pain relief, will transfer patient to inpatient services for a higher level of care.   Reviewed labs, consistent with baseline   [name]  APRN, MSN, FNP-C [facility] [address] [place], [place] [address] [phone]   09/13/2018, 9:31 AM

## 2018-09-13 NOTE — Progress Notes (Signed)
Patient admitted to day hospital for treatment of sickle cell pain crisis. Patient reported generalized pain rated 10/10. Patient placed on Dilaudid PCA, given PO Oxycodone and hydrated with IV fluids. At discharge patient reported her pain at 4/10. Discharge instructions given to patient. Patient alert, oriented and ambulatory at discharge.

## 2018-09-14 ENCOUNTER — Encounter: Payer: Self-pay | Admitting: Obstetrics and Gynecology

## 2018-09-14 ENCOUNTER — Ambulatory Visit (INDEPENDENT_AMBULATORY_CARE_PROVIDER_SITE_OTHER): Payer: Medicaid Other | Admitting: Obstetrics and Gynecology

## 2018-09-14 VITALS — BP 114/70 | HR 98 | Wt 157.0 lb

## 2018-09-14 DIAGNOSIS — O0990 Supervision of high risk pregnancy, unspecified, unspecified trimester: Secondary | ICD-10-CM

## 2018-09-14 DIAGNOSIS — D571 Sickle-cell disease without crisis: Secondary | ICD-10-CM

## 2018-09-14 DIAGNOSIS — O0992 Supervision of high risk pregnancy, unspecified, second trimester: Secondary | ICD-10-CM

## 2018-09-14 MED ORDER — ENSURE HEALTHY MOM PO LIQD: ORAL | 4 refills | Status: DC

## 2018-09-14 NOTE — Addendum Note (Signed)
Addended by: Mora Bellman on: 09/14/2018 03:11 PM   Modules accepted: Orders

## 2018-09-14 NOTE — Progress Notes (Signed)
   PRENATAL VISIT NOTE  Subjective:  Carmen [name] is a 27 y.o. G2P0010 at [redacted]w[redacted]d being seen today for ongoing prenatal care.  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  Patient reports no complaints.  Contractions: Not present. Vag. Bleeding: None.  Movement: Present. Denies leaking of fluid.   The following portions of the patient's history were reviewed and updated as appropriate: allergies, current medications, past family history, past medical history, past social history, past surgical history and problem list. Problem list updated.  Objective:   Vitals:   09/14/18 1441  BP: 114/70  Pulse: 98  Weight: 157 lb (71.2 kg)    Fetal Status: Fetal Heart Rate (bpm): 158   Movement: Present     General:  Alert, oriented and cooperative. Patient is in no acute distress.  Skin: Skin is warm and dry. No rash noted.   Cardiovascular: Normal heart rate noted  Respiratory: Normal respiratory effort, no problems with respiration noted  Abdomen: Soft, gravid, appropriate for gestational age.  Pain/Pressure: Present     Pelvic: Cervical exam deferred        Extremities: Normal range of motion.  Edema: None  Mental Status: Normal mood and affect. Normal behavior. Normal judgment and thought content.   Assessment and Plan:  Pregnancy: G2P0010 at [redacted]w[redacted]d  1. Supervision of high risk pregnancy, antepartum Patient is doing well without complaints Patient scheduled for amniocentesis tomorrow Anatomy ultrasound will be scheduled  2. Hb-SS disease without crisis  ([place]) Stable  Preterm labor symptoms and general obstetric precautions including but not limited to vaginal bleeding, contractions, leaking of fluid and fetal movement were reviewed in detail with the patient. Please refer to After Visit Summary for other counseling recommendations.  No follow-ups on file.  Future Appointments  Date Time Provider [place]  09/15/2018  8:30 AM WH-MFC [place] 5 WH-MFCUS MFC-US  09/15/2018  9:00 AM Lanae Boast, FNP [facility] None  09/15/2018  9:30 AM WH-MFC LAB WH-MFC MFC-US  10/06/2018  9:30 AM WH-MFC [place] 1 WH-MFCUS MFC-US    Mora Bellman, MD

## 2018-09-15 ENCOUNTER — Emergency Department (HOSPITAL_COMMUNITY)
Admission: EM | Admit: 2018-09-15 | Discharge: 2018-09-16 | Disposition: A | Discharge status: Home / Self Care | Payer: Medicaid Other | Attending: Emergency Medicine | Admitting: Emergency Medicine

## 2018-09-15 ENCOUNTER — Telehealth: Payer: Self-pay

## 2018-09-15 ENCOUNTER — Ambulatory Visit (HOSPITAL_COMMUNITY)
Admission: RE | Admit: 2018-09-15 | Payer: No Typology Code available for payment source | Source: Ambulatory Visit | Attending: Obstetrics and Gynecology | Admitting: Obstetrics and Gynecology

## 2018-09-15 ENCOUNTER — Ambulatory Visit (INDEPENDENT_AMBULATORY_CARE_PROVIDER_SITE_OTHER): Payer: Medicaid Other | Admitting: Family Medicine

## 2018-09-15 ENCOUNTER — Encounter: Payer: Self-pay | Admitting: Family Medicine

## 2018-09-15 ENCOUNTER — Other Ambulatory Visit (HOSPITAL_COMMUNITY): Payer: Medicaid Other

## 2018-09-15 ENCOUNTER — Telehealth: Payer: Self-pay | Admitting: Family Medicine

## 2018-09-15 VITALS — BP 121/60 | HR 109 | Temp 98.3°F | Resp 16 | Ht 63.0 in | Wt 159.0 lb

## 2018-09-15 DIAGNOSIS — Z79899 Other long term (current) drug therapy: Secondary | ICD-10-CM | POA: Diagnosis not present

## 2018-09-15 DIAGNOSIS — E559 Vitamin D deficiency, unspecified: Secondary | ICD-10-CM

## 2018-09-15 DIAGNOSIS — D5700 Hb-SS disease with crisis, unspecified: Secondary | ICD-10-CM | POA: Diagnosis not present

## 2018-09-15 DIAGNOSIS — D571 Sickle-cell disease without crisis: Secondary | ICD-10-CM

## 2018-09-15 LAB — POCT URINALYSIS DIPSTICK
Bilirubin, UA: NEGATIVE
Blood, UA: NEGATIVE
Glucose, UA: NEGATIVE
Leukocytes, UA: NEGATIVE
Nitrite, UA: NEGATIVE
Protein, UA: NEGATIVE
Spec Grav, UA: 1.01 (ref 1.010–1.025)
Urobilinogen, UA: 1 E.U./dL
pH, UA: 5.5 (ref 5.0–8.0)

## 2018-09-15 MED ORDER — OXYCODONE HCL ER 30 MG PO T12A: 1.0000 | EXTENDED_RELEASE_TABLET | Freq: Two times a day (BID) | ORAL | 0 refills | Status: DC

## 2018-09-15 NOTE — Telephone Encounter (Signed)
Please call the patient and let her know that she can take tylenol for any break through pain associated with her sickle cell since she would like to use the long acting oxycontin.

## 2018-09-15 NOTE — Progress Notes (Signed)
[facility] INTERNAL MEDICINE AND SICKLE CELL CARE  SICKLE CELL ANEMIA FOLLOW UP VISIT PROVIDER: Lanae Boast, FNP    Subjective:   Carmen [name]  is a 27 y.o.  female who  has a past medical history of Abortion, Headache(784.0), and Sickle cell crisis ([place]). presents for a follow up for Sickle Cell Anemia. She was seen in the day hospital yesterday due to pain. She has had one admission in the past 6 months.  She is currently pregnant and is being followed by the high risk OBGYN. She states that she is having a boy and is due in 02/19/2019. She is excited about her pregnancy.  She has been taking oxycodone intermittently. She is requesting to take the long acting oxycontin instead. She states that helps with her pain better. She understands the risks of taking narcotics while pregnant.   Pain regimen includes: tylenol and oxycodone.  Hydrea Therapy: No Medication compliance: Yes  Pain today is 7/10 and is located in her low back.  The patient reports adequate daily hydration.  Patient states that she is working full time and is experiencing increased fatigue. Also increased pain due to the weather change. She reports that she was informed that she has low iron and needs supplementation. Last ferritin was  08/18/2018 and was 207. Will repeat today.    Review of Systems  Constitutional: Positive for malaise/fatigue.  HENT: Negative.   Eyes: Negative.   Respiratory: Negative.   Cardiovascular: Negative.   Gastrointestinal: Negative.   Genitourinary: Negative.   Musculoskeletal: Positive for back pain and myalgias.  Skin: Negative.   Neurological: Negative.   Psychiatric/Behavioral: Negative.     Objective:   Objective  BP 121/60 (BP Location: Right Arm, Patient Position: Sitting, Cuff Size: Normal)   Pulse (!) 109   Temp 98.3 F (36.8 C) (Oral)   Resp 16   Ht 5\' 3"  (1.6 m)   Wt 159 lb (72.1 kg)   LMP 05/15/2018 (Approximate)   SpO2 99%   BMI 28.17 kg/m     Physical Exam  Constitutional: She is oriented to person, place, and time. She appears well-developed and well-nourished. No distress.  HENT:  Head: Normocephalic and atraumatic.  Eyes: Pupils are equal, round, and reactive to light. Conjunctivae and EOM are normal.  Neck: Normal range of motion.  Cardiovascular: Normal rate, regular rhythm, normal heart sounds and intact distal pulses.  Pulmonary/Chest: Effort normal and breath sounds normal. No respiratory distress.  Musculoskeletal: Normal range of motion. She exhibits tenderness (lower back).  Neurological: She is alert and oriented to person, place, and time.  Skin: Skin is warm and dry.  Psychiatric: She has a normal mood and affect. Her behavior is normal. Thought content normal.  Nursing note and vitals reviewed.    Assessment/Plan:   Assessment   Encounter Diagnosis  Name Primary?  Marland Kitchen Hb-SS disease without crisis ([place]) Yes     Plan   1. Hb-SS disease without crisis [facility]) Patient requesting change to long acting oxycontin. Sent to pharmacy. Reviewed benefits and risks of medications and affects on the fetus. Patient states that she is aware. Patient instructed to use tylenol for break through pain.  - Urinalysis Dipstick - CBC with Differential - Iron, TIBC and Ferritin Panel - oxyCODONE (OXYCONTIN) 30 MG 12 hr tablet; Take 1 tablet (30 mg total) by mouth every 12 (twelve) hours.  Dispense: 60 tablet; Refill: 0 - VITAMIN D 25 Hydroxy (Vit-D Deficiency, Fractures) - Vitamin B12  Return to care as scheduled and prn. Patient verbalized understanding and agreed with plan of care.   1. Sickle cell disease -  We discussed the need for good hydration, monitoring of hydration status, avoidance of heat, cold, stress, and infection triggers. We discussed the risks and benefits of Hydrea, including bone marrow suppression, the possibility of GI upset, skin ulcers, hair thinning, and teratogenicity. The patient was reminded of  the need to seek medical attention of any symptoms of bleeding, anemia, or infection. Continue folic acid 1 mg daily to prevent aplastic bone marrow crises.   2. Pulmonary evaluation - Patient denies severe recurrent wheezes, shortness of breath with exercise, or persistent cough. If these symptoms develop, pulmonary function tests with spirometry will be ordered, and if abnormal, plan on referral to Pulmonology for further evaluation.  3. Cardiac - Routine screening for pulmonary hypertension is not recommended.  4. Eye - High risk of proliferative retinopathy. Annual eye exam with retinal exam recommended to patient.  5. Immunization status -  Yearly influenza vaccination is recommended, as well as being up to date with Meningococcal and Pneumococcal vaccines.   6. Acute and chronic painful episodes - We discussed that pt is to receive Schedule II prescriptions only from [place]. Pt is also aware that the prescription history is available to [place] online through the [facility] CSRS. Controlled substance agreement signed. We reminded Carmen [name] that all patients receiving Schedule II narcotics must be seen for follow within one month of prescription being requested. We reviewed the terms of our pain agreement, including the need to keep medicines in a safe locked location away from children or pets, and the need to report excess sedation or constipation, measures to avoid constipation, and policies related to early refills and stolen prescriptions. According to the [place] [facility] program, we have reviewed details related to analgesia, adverse effects, aberrant behaviors.  7. Iron overload from chronic transfusion.  Not applicable at this time.  If this occurs will use Exjade for management.   8. Vitamin D deficiency - Drisdol 50,000 units weekly. Patient encouraged to take as prescribed.   The above recommendations are taken from the NIH Evidence-Based Management of Sickle Cell Disease: Expert Panel  Report, 20149.   [name], FNP-BC [facility] [address] [place], [place] [address] [phone]  This note has been created with Dragon speech recognition software and smart phrase technology. Any transcriptional errors are unintentional.

## 2018-09-15 NOTE — Patient Instructions (Signed)
Pregnancy and Anemia Anemia is a condition in which the concentration of red blood cells or hemoglobin in the blood is below normal. Hemoglobin is a substance in red blood cells that carries oxygen to the tissues of the body. Anemia results in not enough oxygen reaching these tissues. Anemia during pregnancy is common because the fetus uses more iron and folic acid as it is developing. Your body may not produce enough red blood cells because of this. Also, during pregnancy, the liquid part of the blood (plasma) increases by about 50%, and the red blood cells increase by only 25%. This lowers the concentration of the red blood cells and creates a natural anemia-like situation. What are the causes? The most common cause of anemia during pregnancy is not having enough iron in the body to make red blood cells (iron deficiency anemia). Other causes may include:  Folic acid deficiency.  Vitamin B12 deficiency.  Certain prescription or over-the-counter medicines.  Certain medical conditions or infections that destroy red blood cells.  A low platelet count and bleeding caused by antibodies that go through the placenta to the fetus from the mother's blood. What are the signs or symptoms? Mild anemia may not be noticeable. If it becomes severe, symptoms may include:  Tiredness.  Shortness of breath, especially with exercise.  Weakness.  Fainting.  Pale looking skin.  Headaches.  Feeling a fast or irregular heartbeat (palpitations). How is this diagnosed? The type of anemia is usually diagnosed from your family and medical history and blood tests. How is this treated? Treatment of anemia during pregnancy depends on the cause of the anemia. Treatment can include:  Supplements of iron, vitamin B12, or folic acid.  A blood transfusion. This may be needed if blood loss is severe.  Hospitalization. This may be needed if there is significant continual blood loss.  Dietary changes. Follow  these instructions at home:  Follow your dietitian's or health care provider's dietary recommendations.  Increase your vitamin C intake. This will help the stomach absorb more iron.  Eat a diet rich in iron. This would include foods such as:  Liver.  Beef.  Whole grain bread.  Eggs.  Dried fruit.  Take iron and vitamins as directed by your health care provider.  Eat green leafy vegetables. These are a good source of folic acid. Contact a health care provider if:  You have frequent or lasting headaches.  You are looking pale.  You are bruising easily. Get help right away if:  You have extreme weakness, shortness of breath, or chest pain.  You become dizzy or have trouble concentrating.  You have heavy vaginal bleeding.  You develop a rash.  You have bloody or black, tarry stools.  You faint.  You vomit up blood.  You vomit repeatedly.  You have abdominal pain.  You have a fever or persistent symptoms for more than 2-3 days.  You have a fever and your symptoms suddenly get worse.  You are dehydrated. This information is not intended to replace advice given to you by your health care provider. Make sure you discuss any questions you have with your health care provider. Document Released: 10/15/2000 Document Revised: 03/25/2016 Document Reviewed: 05/30/2013 Elsevier Interactive Patient Education  2017 [employer]vier Inc.  

## 2018-09-15 NOTE — Telephone Encounter (Signed)
Called and spoke with patient. Advised that she can take tylenol otc for break through pain while being on the oxycontin long acting. Thanks!

## 2018-09-15 NOTE — Telephone Encounter (Signed)
Spoke with patient, advised that we will not call pharmacy to fill early.

## 2018-09-16 ENCOUNTER — Encounter (HOSPITAL_COMMUNITY): Payer: Self-pay

## 2018-09-16 ENCOUNTER — Other Ambulatory Visit: Payer: Self-pay

## 2018-09-16 LAB — COMPREHENSIVE METABOLIC PANEL
ALBUMIN: 3.6 g/dL (ref 3.5–5.0)
ALT: 31 U/L (ref 0–44)
ANION GAP: 7 (ref 5–15)
AST: 31 U/L (ref 15–41)
Alkaline Phosphatase: 57 U/L (ref 38–126)
BUN: 10 mg/dL (ref 6–20)
CO2: 22 mmol/L (ref 22–32)
Calcium: 9 mg/dL (ref 8.9–10.3)
Chloride: 109 mmol/L (ref 98–111)
Creatinine, Ser: 0.41 mg/dL — ABNORMAL LOW (ref 0.44–1.00)
GFR calc Af Amer: >60 mL/min (ref >60)
GFR calc non Af Amer: >60 mL/min (ref >60)
GLUCOSE: 94 mg/dL (ref 70–99)
Potassium: 3.7 mmol/L (ref 3.5–5.1)
SODIUM: 138 mmol/L (ref 135–145)
Total Bilirubin: 1.2 mg/dL (ref 0.3–1.2)
Total Protein: 7.2 g/dL (ref 6.5–8.1)

## 2018-09-16 LAB — CBC WITH DIFFERENTIAL/PLATELET
Abs Immature Granulocytes: 0.1 10*3/uL — ABNORMAL HIGH (ref 0.00–0.07)
BASOS ABS: 0 10*3/uL (ref 0.0–0.1)
Basophils Absolute: 0 10*3/uL (ref 0.0–0.2)
Basophils Relative: 0 %
Basos: 0 %
EOS (ABSOLUTE): 0.1 10*3/uL (ref 0.0–0.4)
EOS PCT: 1 %
Eos: 1 %
Eosinophils Absolute: 0.1 10*3/uL (ref 0.0–0.5)
HEMATOCRIT: 20.3 % — AB (ref 36.0–46.0)
HEMOGLOBIN: 7.2 g/dL — AB (ref 12.0–15.0)
Hematocrit: 22.8 % — ABNORMAL LOW (ref 34.0–46.6)
Hemoglobin: 7.9 g/dL — ABNORMAL LOW (ref 11.1–15.9)
IMMATURE GRANULOCYTES: 1 %
Immature Grans (Abs): 0.1 10*3/uL (ref 0.0–0.1)
Immature Granulocytes: 1 %
LYMPHS ABS: 2.2 10*3/uL (ref 0.7–4.0)
LYMPHS PCT: 18 %
Lymphocytes Absolute: 2.4 10*3/uL (ref 0.7–3.1)
Lymphs: 20 %
MCH: 31.2 pg (ref 26.6–33.0)
MCH: 30.8 pg (ref 26.0–34.0)
MCHC: 34.6 g/dL (ref 31.5–35.7)
MCHC: 35.5 g/dL (ref 30.0–36.0)
MCV: 90 fL (ref 79–97)
MCV: 86.8 fL (ref 80.0–100.0)
MONOS PCT: 12 %
Monocytes Absolute: 1.3 10*3/uL — ABNORMAL HIGH (ref 0.1–0.9)
Monocytes Absolute: 1.5 10*3/uL — ABNORMAL HIGH (ref 0.1–1.0)
Monocytes: 11 %
NRBC: 1 % — ABNORMAL HIGH (ref 0–0)
NRBC: 1.1 % — AB (ref 0.0–0.2)
Neutro Abs: 8.3 10*3/uL — ABNORMAL HIGH (ref 1.7–7.7)
Neutrophils Absolute: 7.9 10*3/uL — ABNORMAL HIGH (ref 1.4–7.0)
Neutrophils Relative %: 68 %
Neutrophils: 67 %
Platelets: 555 10*3/uL — ABNORMAL HIGH (ref 150–450)
Platelets: 481 10*3/uL — ABNORMAL HIGH (ref 150–400)
RBC: 2.53 x10E6/uL — CL (ref 3.77–5.28)
RBC: 2.34 MIL/uL — ABNORMAL LOW (ref 3.87–5.11)
RDW: 15.5 % — ABNORMAL HIGH (ref 12.3–15.4)
RDW: 13.5 % (ref 11.5–15.5)
WBC: 11.7 10*3/uL — ABNORMAL HIGH (ref 3.4–10.8)
WBC: 12.2 10*3/uL — ABNORMAL HIGH (ref 4.0–10.5)

## 2018-09-16 LAB — RETICULOCYTES
Immature Retic Fract: 41 % — ABNORMAL HIGH (ref 2.3–15.9)
RBC.: 2.34 MIL/uL — ABNORMAL LOW (ref 3.87–5.11)
RETIC COUNT ABSOLUTE: 160.5 10*3/uL (ref 19.0–186.0)
Retic Ct Pct: 6.9 % — ABNORMAL HIGH (ref 0.4–3.1)

## 2018-09-16 LAB — IRON,TIBC AND FERRITIN PANEL
Ferritin: 218 ng/mL — ABNORMAL HIGH (ref 15–150)
Iron Saturation: 80 % (ref 15–55)
Iron: 202 ug/dL — ABNORMAL HIGH (ref 27–159)
Total Iron Binding Capacity: 252 ug/dL (ref 250–450)
UIBC: 50 ug/dL — ABNORMAL LOW (ref 131–425)

## 2018-09-16 LAB — VITAMIN D 25 HYDROXY (VIT D DEFICIENCY, FRACTURES): Vit D, 25-Hydroxy: 39.6 ng/mL (ref 30.0–100.0)

## 2018-09-16 LAB — VITAMIN B12: Vitamin B-12: 620 pg/mL (ref 232–1245)

## 2018-09-16 MED ORDER — HYDROMORPHONE HCL 2 MG/ML IJ SOLN: 2.0000 mg | INTRAMUSCULAR | Status: AC

## 2018-09-16 MED ORDER — HYDROMORPHONE HCL 2 MG/ML IJ SOLN
2.0000 mg | INTRAMUSCULAR | Status: AC
  Administered 2018-09-16: 2 mg via INTRAVENOUS
  Filled 2018-09-16: qty 1

## 2018-09-16 MED ORDER — HYDROMORPHONE HCL 2 MG/ML IJ SOLN: 2.0000 mg | INTRAMUSCULAR | Status: DC

## 2018-09-16 MED ORDER — ONDANSETRON HCL 4 MG/2ML IJ SOLN
4.0000 mg | INTRAMUSCULAR | Status: DC | PRN
  Administered 2018-09-16: 4 mg via INTRAVENOUS
  Filled 2018-09-16: qty 2

## 2018-09-16 MED ORDER — DIPHENHYDRAMINE HCL 25 MG PO CAPS
25.0000 mg | ORAL_CAPSULE | ORAL | Status: DC | PRN
  Administered 2018-09-16: 50 mg via ORAL
  Filled 2018-09-16: qty 2

## 2018-09-16 MED ORDER — SODIUM CHLORIDE 0.45 % IV SOLN
INTRAVENOUS | Status: DC
  Administered 2018-09-16: 02:00:00 via INTRAVENOUS

## 2018-09-16 NOTE — ED Triage Notes (Signed)
PT C/O PAIN ALL OVER DUE TO SICKLE CELL CRISIS X1 WEEK. PT ALSO STS SHE GOT LIGHTHEADED AND DIZZY LAST NIGHT AND FELL OUT OF HER ONTO HER BACK. DENIES LOC OR HEAD INJURY.

## 2018-09-16 NOTE — ED Provider Notes (Signed)
[facility] DEPT Provider Note   CSN: [id number] Arrival date & time: 09/15/18  2352     History   Chief Complaint Chief Complaint  Patient presents with  . Sickle Cell Pain Crisis  . Fall    HPI Carmen [name] is a 27 y.o. female.  Patient presents to the emergency department with a chief complaint of the consult pain.  She complains of pain in the low back and lower extremities.  She denies any fevers chills.  She is [redacted] weeks pregnant.  She has close follow-up with OB/GYN.  Recently seen at sickle-cell clinic and OB/GYN this week.  She is has been taking her home meds as prescribed.  Denies any chest pain or shortness of breath.  The history is provided by the patient. No language interpreter was used.    Past Medical History:  Diagnosis Date  . Abortion    05/2012  . Headache(784.0)   . Sickle cell crisis [facility])     Patient Active Problem List   Diagnosis Date Noted  . Supervision of high risk pregnancy, antepartum 08/17/2018  . Chronic prescription opiate use 03/13/2018  . Chronic musculoskeletal pain 03/13/2018  . Vitamin D deficiency 01/13/2018  . 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 Anemia of chronic disease   . Sickle cell crisis ([place]) 02/28/2015  . S/P laparoscopic cholecystectomy 11/30/2014  . Thrombocytosis ([place]) 11/22/2014  . Systolic ejection murmur 40/98/1191    Past Surgical History:  Procedure Laterality Date  . CHOLECYSTECTOMY N/A 11/30/2014   Procedure: LAPAROSCOPIC CHOLECYSTECTOMY SINGLE SITE WITH INTRAOPERATIVE CHOLANGIOGRAM;  Surgeon: Michael Boston, MD;  Location: [facility] ORS;  Service: General;  Laterality: N/A;  . SPLENECTOMY       OB History    Gravida  2   Para      Term      Preterm      AB  1   Living        SAB      TAB  1   Ectopic      Multiple      Live Births               Home Medications    Prior to  Admission medications   Medication Sig Start Date End Date Taking? Authorizing Provider  aspirin EC 81 MG tablet Take 1 tablet (81 mg total) by mouth daily. 08/17/18  Yes [name], MD  docusate sodium (COLACE) 50 MG capsule Take 1 capsule (50 mg total) by mouth 2 (two) times daily as needed for mild constipation. 08/17/18 09/16/18 Yes Lanae Boast, FNP  ergocalciferol (VITAMIN D2) 50000 units capsule Take 1 capsule (50,000 Units total) by mouth once a week. 05/22/18  Yes Tresa Garter, MD  folic acid (FOLVITE) 1 MG tablet Take 1 tablet (1 mg total) by mouth daily. 08/17/18  Yes [name], MD  Nutritional Supplements (ENSURE HEALTHY MOM) LIQD Consume 2 bottles daily 09/14/18  Yes Constant, Peggy, MD  oxyCODONE (OXYCONTIN) 30 MG 12 hr tablet Take 1 tablet (30 mg total) by mouth every 12 (twelve) hours. 09/15/18 10/15/18 Yes Lanae Boast, FNP  Prenatal Vit-Fe Fumarate-FA (PREPLUS) 27-1 MG TABS Take 1 tablet by mouth daily. 08/17/18  Yes [name], MD  Doxylamine-Pyridoxine 10-10 MG TBEC Take 2 tablets by mouth at bedtime. 2 tabs po QHS x 2 nights. If symptoms persist then 1 tab po Q  AM and 2 tabs QHS. Give on empty stomach Patient not taking: Reported on 08/17/2018 07/20/18   Lanae Boast, FNP    Family History Family History  Problem Relation Age of Onset  . Hypertension Mother   . Sickle cell anemia Sister   . Kidney disease Sister        Lupus  . Arthritis Sister   . Sickle cell anemia Sister   . Sickle cell trait Sister   . Heart disease Maternal Aunt        CABG  . Heart disease Maternal Uncle        CABG  . Lupus Sister     Social History Social History   Tobacco Use  . Smoking status: Never Smoker  . Smokeless tobacco: Never Used  Substance Use Topics  . Alcohol use: No  . Drug use: No     Allergies   Patient has no known allergies.   Review of Systems Review of Systems  All other systems reviewed and are negative.    Physical  Exam Updated Vital Signs BP 107/65   Pulse 100   Temp 98.6 F (37 C) (Oral)   Resp 18   Ht 5\' 3"  (1.6 m)   Wt 72 kg   LMP 05/15/2018 (Approximate)   SpO2 100%   BMI 28.12 kg/m   Physical Exam  Constitutional: She is oriented to person, place, and time. She appears well-developed and well-nourished.  HENT:  Head: Normocephalic and atraumatic.  Eyes: Pupils are equal, round, and reactive to light. Conjunctivae and EOM are normal.  Neck: Normal range of motion. Neck supple.  Cardiovascular: Normal rate and regular rhythm. Exam reveals no gallop and no friction rub.  No murmur heard. Pulmonary/Chest: Effort normal and breath sounds normal. No respiratory distress. She has no wheezes. She has no rales. She exhibits no tenderness.  Abdominal: Soft. Bowel sounds are normal. She exhibits no distension and no mass. There is no tenderness. There is no rebound and no guarding.  Musculoskeletal: Normal range of motion. She exhibits no edema or tenderness.  Neurological: She is alert and oriented to person, place, and time.  Skin: Skin is warm and dry.  Psychiatric: She has a normal mood and affect. Her behavior is normal. Judgment and thought content normal.  Nursing note and vitals reviewed.    ED Treatments / Results  Labs (all labs ordered are listed, but only abnormal results are displayed) Labs Reviewed  CBC WITH DIFFERENTIAL/PLATELET - Abnormal; Notable for the following components:      Result Value   WBC 12.2 (*)    RBC 2.34 (*)    Hemoglobin 7.2 (*)    HCT 20.3 (*)    Platelets 481 (*)    nRBC 1.1 (*)    Neutro Abs 8.3 (*)    Monocytes Absolute 1.5 (*)    Abs Immature Granulocytes 0.10 (*)    All other components within normal limits  RETICULOCYTES - Abnormal; Notable for the following components:   Retic Ct Pct 6.9 (*)    RBC. 2.34 (*)    Immature Retic Fract 41.0 (*)    All other components within normal limits  COMPREHENSIVE METABOLIC PANEL - Abnormal; Notable for  the following components:   Creatinine, Ser 0.41 (*)    All other components within normal limits    EKG None  Radiology No results found.  Procedures Procedures (including critical care time)  Medications Ordered in ED Medications  0.45 % sodium chloride infusion (  Intravenous Rate/Dose Verify 09/16/18 0245)  HYDROmorphone (DILAUDID) injection 2 mg (has no administration in time range)    Or  HYDROmorphone (DILAUDID) injection 2 mg (has no administration in time range)  diphenhydrAMINE (BENADRYL) capsule 25-50 mg (50 mg Oral Given 09/16/18 0207)  ondansetron (ZOFRAN) injection 4 mg (4 mg Intravenous Given 09/16/18 0208)  HYDROmorphone (DILAUDID) injection 2 mg (2 mg Intravenous Given 09/16/18 0209)    Or  HYDROmorphone (DILAUDID) injection 2 mg ( Subcutaneous See Alternative 09/16/18 0209)  HYDROmorphone (DILAUDID) injection 2 mg (2 mg Intravenous Given 09/16/18 0300)    Or  HYDROmorphone (DILAUDID) injection 2 mg ( Subcutaneous See Alternative 09/16/18 0300)  HYDROmorphone (DILAUDID) injection 2 mg (2 mg Intravenous Given 09/16/18 0345)    Or  HYDROmorphone (DILAUDID) injection 2 mg ( Subcutaneous See Alternative 09/16/18 0345)     Initial Impression / Assessment and Plan / ED Course  I have reviewed the triage vital signs and the nursing notes.  Pertinent labs & imaging results that were available during my care of the patient were reviewed by me and considered in my medical decision making (see chart for details).     Sickle cell pain in her low back.  No chest pain or shortness of breath.  She is 17-weeks pregnant.  Laboratory work-up is near baseline.  Reviewed with Dr. Dayna Barker.    0400 Patient states that she would like to be discharged.  Final Clinical Impressions(s) / ED Diagnoses   Final diagnoses:  Sickle cell anemia with pain [facility])    ED Discharge Orders    None       Montine Circle, PA-C 09/16/18 0429    Merrily Pew, MD 09/16/18 [phone]

## 2018-09-21 ENCOUNTER — Ambulatory Visit (HOSPITAL_COMMUNITY): Payer: Medicaid Other | Attending: Obstetrics and Gynecology

## 2018-09-21 ENCOUNTER — Ambulatory Visit (HOSPITAL_COMMUNITY): Admission: RE | Admit: 2018-09-21 | Payer: Medicaid Other | Source: Ambulatory Visit

## 2018-09-21 ENCOUNTER — Emergency Department (HOSPITAL_COMMUNITY)
Admission: EM | Admit: 2018-09-21 | Discharge: 2018-09-21 | Disposition: A | Discharge status: Home / Self Care | Payer: Medicaid Other | Attending: Emergency Medicine | Admitting: Emergency Medicine

## 2018-09-21 ENCOUNTER — Encounter (HOSPITAL_COMMUNITY): Payer: Self-pay | Admitting: Emergency Medicine

## 2018-09-21 ENCOUNTER — Other Ambulatory Visit: Payer: Self-pay

## 2018-09-21 DIAGNOSIS — O9989 Other specified diseases and conditions complicating pregnancy, childbirth and the puerperium: Secondary | ICD-10-CM | POA: Diagnosis not present

## 2018-09-21 DIAGNOSIS — O99012 Anemia complicating pregnancy, second trimester: Secondary | ICD-10-CM | POA: Insufficient documentation

## 2018-09-21 DIAGNOSIS — Z79899 Other long term (current) drug therapy: Secondary | ICD-10-CM | POA: Diagnosis not present

## 2018-09-21 DIAGNOSIS — Z7982 Long term (current) use of aspirin: Secondary | ICD-10-CM | POA: Insufficient documentation

## 2018-09-21 DIAGNOSIS — Z3A18 18 weeks gestation of pregnancy: Secondary | ICD-10-CM

## 2018-09-21 DIAGNOSIS — D5700 Hb-SS disease with crisis, unspecified: Secondary | ICD-10-CM | POA: Insufficient documentation

## 2018-09-21 LAB — COMPREHENSIVE METABOLIC PANEL WITH GFR
ALT: 52 U/L — ABNORMAL HIGH (ref 0–44)
AST: 36 U/L (ref 15–41)
Albumin: 2.9 g/dL — ABNORMAL LOW (ref 3.5–5.0)
Alkaline Phosphatase: 54 U/L (ref 38–126)
Anion gap: 5 (ref 5–15)
BUN: <5 mg/dL — ABNORMAL LOW (ref 6–20)
CO2: 20 mmol/L — ABNORMAL LOW (ref 22–32)
Calcium: 8.8 mg/dL — ABNORMAL LOW (ref 8.9–10.3)
Chloride: 111 mmol/L (ref 98–111)
Creatinine, Ser: 0.57 mg/dL (ref 0.44–1.00)
GFR calc Af Amer: >60 mL/min
GFR calc non Af Amer: >60 mL/min
Glucose, Bld: 91 mg/dL (ref 70–99)
Potassium: 3.3 mmol/L — ABNORMAL LOW (ref 3.5–5.1)
Sodium: 136 mmol/L (ref 135–145)
Total Bilirubin: 1.2 mg/dL (ref 0.3–1.2)
Total Protein: 6.5 g/dL (ref 6.5–8.1)

## 2018-09-21 LAB — CBC WITH DIFFERENTIAL/PLATELET
Abs Immature Granulocytes: 0.06 10*3/uL (ref 0.00–0.07)
BASOS ABS: 0 10*3/uL (ref 0.0–0.1)
BASOS PCT: 0 %
EOS ABS: 0.2 10*3/uL (ref 0.0–0.5)
Eosinophils Relative: 2 %
HCT: 22.2 % — ABNORMAL LOW (ref 36.0–46.0)
Hemoglobin: 7.7 g/dL — ABNORMAL LOW (ref 12.0–15.0)
IMMATURE GRANULOCYTES: 1 %
Lymphocytes Relative: 23 %
Lymphs Abs: 2.2 10*3/uL (ref 0.7–4.0)
MCH: 30.3 pg (ref 26.0–34.0)
MCHC: 34.7 g/dL (ref 30.0–36.0)
MCV: 87.4 fL (ref 80.0–100.0)
Monocytes Absolute: 1.1 10*3/uL — ABNORMAL HIGH (ref 0.1–1.0)
Monocytes Relative: 11 %
NEUTROS ABS: 6.1 10*3/uL (ref 1.7–7.7)
NEUTROS PCT: 63 %
NRBC: 2 % — AB (ref 0.0–0.2)
PLATELETS: 524 10*3/uL — AB (ref 150–400)
RBC: 2.54 MIL/uL — AB (ref 3.87–5.11)
RDW: 14.2 % (ref 11.5–15.5)
WBC: 9.7 10*3/uL (ref 4.0–10.5)

## 2018-09-21 LAB — PROTIME-INR
INR: 1.07
Prothrombin Time: 13.8 seconds (ref 11.4–15.2)

## 2018-09-21 LAB — RETICULOCYTES
Immature Retic Fract: 31.9 % — ABNORMAL HIGH (ref 2.3–15.9)
RBC.: 2.54 MIL/uL — ABNORMAL LOW (ref 3.87–5.11)
Retic Count, Absolute: 187.2 K/uL — ABNORMAL HIGH (ref 19.0–186.0)
Retic Ct Pct: 7.4 % — ABNORMAL HIGH (ref 0.4–3.1)

## 2018-09-21 MED ORDER — HYDROMORPHONE HCL 1 MG/ML IJ SOLN: 2.0000 mg | INTRAMUSCULAR | Status: AC

## 2018-09-21 MED ORDER — PROMETHAZINE HCL 25 MG/ML IJ SOLN
12.5000 mg | Freq: Once | INTRAMUSCULAR | Status: AC
  Administered 2018-09-21: 12.5 mg via INTRAVENOUS
  Filled 2018-09-21: qty 1

## 2018-09-21 MED ORDER — HYDROMORPHONE HCL 1 MG/ML IJ SOLN
2.0000 mg | INTRAMUSCULAR | Status: AC
  Administered 2018-09-21 (×2): 2 mg via INTRAVENOUS
  Filled 2018-09-21 (×2): qty 2

## 2018-09-21 MED ORDER — HYDROMORPHONE HCL 1 MG/ML IJ SOLN
2.0000 mg | INTRAMUSCULAR | Status: AC
  Administered 2018-09-21: 2 mg via INTRAVENOUS
  Filled 2018-09-21: qty 2

## 2018-09-21 MED ORDER — OXYCODONE HCL ER 10 MG PO T12A
30.0000 mg | EXTENDED_RELEASE_TABLET | Freq: Two times a day (BID) | ORAL | Status: DC
  Administered 2018-09-21: 30 mg via ORAL
  Filled 2018-09-21: qty 3

## 2018-09-21 MED ORDER — DEXTROSE-NACL 5-0.45 % IV SOLN
INTRAVENOUS | Status: DC
  Administered 2018-09-21: 08:00:00 via INTRAVENOUS

## 2018-09-21 NOTE — Discharge Instructions (Addendum)
1.  Follow-up with your obstetrician as soon as possible. 2.  Follow-up with your sickle cell doctor as soon as possible.

## 2018-09-21 NOTE — ED Provider Notes (Signed)
[place] EMERGENCY DEPARTMENT Provider Note   CSN: [id number] Arrival date & time: 09/21/18  0093     History   Chief Complaint Chief Complaint  Patient presents with  . Sickle Cell Pain Crisis    HPI Carmen [name] is a 27 y.o. female.  HPI Patient reports she is having sickle cell pain.  She reports that she has pain in both legs.  Pain is aching in quality.  She reports that she was outside walking around last night.  She believes that is what triggered her pain crisis.  She denies weakness or numbness in the legs.  She denies abdominal pain.  She denies chest pain, shortness of breath, cough or fever.  She denies abdominal pain.  Patient is [redacted] weeks gestation.  She denies any pain burning urgency with urination.  She denies vaginal bleeding or discharge.  She reports her pain medication regimen is oxycodone 30 mg extended release every 12 hours.  She reports last dose of p.m. yesterday.  She reports this was a change from her cassette 10 mg about a week ago.  She reports that is fairly effective for her. Past Medical History:  Diagnosis Date  . Abortion    05/2012  . Headache(784.0)   . Sickle cell crisis [facility])     Patient Active Problem List   Diagnosis Date Noted  . Supervision of high risk pregnancy, antepartum 08/17/2018  . Chronic prescription opiate use 03/13/2018  . Chronic musculoskeletal pain 03/13/2018  . Vitamin D deficiency 01/13/2018  . 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 Anemia of chronic disease   . Sickle cell crisis ([place]) 02/28/2015  . S/P laparoscopic cholecystectomy 11/30/2014  . Thrombocytosis ([place]) 11/22/2014  . Systolic ejection murmur 81/82/9937    Past Surgical History:  Procedure Laterality Date  . CHOLECYSTECTOMY N/A 11/30/2014   Procedure: LAPAROSCOPIC CHOLECYSTECTOMY SINGLE SITE WITH INTRAOPERATIVE CHOLANGIOGRAM;  Surgeon: Michael Boston, MD;   Location: [facility] ORS;  Service: General;  Laterality: N/A;  . SPLENECTOMY       OB History    Gravida  2   Para      Term      Preterm      AB  1   Living        SAB      TAB  1   Ectopic      Multiple      Live Births               Home Medications    Prior to Admission medications   Medication Sig Start Date End Date Taking? Authorizing Provider  aspirin EC 81 MG tablet Take 1 tablet (81 mg total) by mouth daily. 08/17/18  Yes [name], MD  ergocalciferol (VITAMIN D2) 50000 units capsule Take 1 capsule (50,000 Units total) by mouth once a week. 05/22/18  Yes Tresa Garter, MD  folic acid (FOLVITE) 1 MG tablet Take 1 tablet (1 mg total) by mouth daily. 08/17/18  Yes [name], MD  oxyCODONE (OXYCONTIN) 30 MG 12 hr tablet Take 1 tablet (30 mg total) by mouth every 12 (twelve) hours. Patient taking differently: Take 1 tablet by mouth 2 (two) times daily as needed (pain).  09/15/18 10/15/18 Yes Lanae Boast, FNP  Prenatal Vit-Fe Fumarate-FA (PREPLUS) 27-1 MG TABS Take 1 tablet by mouth daily. 08/17/18  Yes [name], MD  Doxylamine-Pyridoxine 10-10 MG  TBEC Take 2 tablets by mouth at bedtime. 2 tabs po QHS x 2 nights. If symptoms persist then 1 tab po Q AM and 2 tabs QHS. Give on empty stomach Patient not taking: Reported on 08/17/2018 07/20/18   Lanae Boast, FNP  Nutritional Supplements (ENSURE HEALTHY MOM) LIQD Consume 2 bottles daily Patient not taking: Reported on 09/21/2018 09/14/18   Constant, Peggy, MD    Family History Family History  Problem Relation Age of Onset  . Hypertension Mother   . Sickle cell anemia Sister   . Kidney disease Sister        Lupus  . Arthritis Sister   . Sickle cell anemia Sister   . Sickle cell trait Sister   . Heart disease Maternal Aunt        CABG  . Heart disease Maternal Uncle        CABG  . Lupus Sister     Social History Social History   Tobacco Use  . Smoking status: Never Smoker    . Smokeless tobacco: Never Used  Substance Use Topics  . Alcohol use: No  . Drug use: No     Allergies   Patient has no known allergies.   Review of Systems Review of Systems 10 Systems reviewed and are negative for acute change except as noted in the HPI.   Physical Exam Updated Vital Signs BP 104/74   Pulse 84   Resp 19   Wt 72.1 kg   LMP 05/15/2018 (Approximate)   SpO2 97%   BMI 28.17 kg/m   Physical Exam  Constitutional: She is oriented to person, place, and time. She appears well-developed and well-nourished. No distress.  HENT:  Head: Normocephalic and atraumatic.  Nose: Nose normal.  Mouth/Throat: Oropharynx is clear and moist.  Eyes: Pupils are equal, round, and reactive to light. EOM are normal.  Neck: Neck supple.  Cardiovascular: Normal rate, regular rhythm, normal heart sounds and intact distal pulses.  Pulmonary/Chest: Effort normal and breath sounds normal.  Abdominal: Soft. Bowel sounds are normal. She exhibits no distension. There is no tenderness.  Musculoskeletal: Normal range of motion. She exhibits no edema or tenderness.  2+ b/l DP pulses  Neurological: She is alert and oriented to person, place, and time. She has normal strength. She exhibits normal muscle tone. Coordination normal. GCS eye subscore is 4. GCS verbal subscore is 5. GCS motor subscore is 6.  Skin: Skin is warm, dry and intact.  Psychiatric: She has a normal mood and affect.     ED Treatments / Results  Labs (all labs ordered are listed, but only abnormal results are displayed) Labs Reviewed  CBC WITH DIFFERENTIAL/PLATELET - Abnormal; Notable for the following components:      Result Value   RBC 2.54 (*)    Hemoglobin 7.7 (*)    HCT 22.2 (*)    Platelets 524 (*)    nRBC 2.0 (*)    Monocytes Absolute 1.1 (*)    All other components within normal limits  RETICULOCYTES - Abnormal; Notable for the following components:   Retic Ct Pct 7.4 (*)    RBC. 2.54 (*)    Retic  Count, Absolute 187.2 (*)    Immature Retic Fract 31.9 (*)    All other components within normal limits  COMPREHENSIVE METABOLIC PANEL - Abnormal; Notable for the following components:   Potassium 3.3 (*)    CO2 20 (*)    BUN <5 (*)    Calcium 8.8 (*)  Albumin 2.9 (*)    ALT 52 (*)    All other components within normal limits  PROTIME-INR    EKG EKG Interpretation  Date/Time:  Thursday September 21 2018 06:54:03 EST Ventricular Rate:  89 PR Interval:    QRS Duration: 79 QT Interval:  360 QTC Calculation: 438 R Axis:   26 Text Interpretation:  Sinus rhythm RSR' in V1 or V2, probably normal variant no change from previous Confirmed by Charlesetta Shanks [phone]) on 09/21/2018 9:44:03 AM   Radiology No results found.  Procedures Procedures (including critical care time)  Medications Ordered in ED Medications  dextrose 5 %-0.45 % sodium chloride infusion ( Intravenous New Bag/Given 09/21/18 0736)  oxyCODONE (OXYCONTIN) 12 hr tablet 30 mg (30 mg Oral Given 09/21/18 1131)  HYDROmorphone (DILAUDID) injection 2 mg (2 mg Intravenous Given 09/21/18 0735)    Or  HYDROmorphone (DILAUDID) injection 2 mg ( Subcutaneous See Alternative 09/21/18 0735)  HYDROmorphone (DILAUDID) injection 2 mg (2 mg Intravenous Given 09/21/18 0813)    Or  HYDROmorphone (DILAUDID) injection 2 mg ( Subcutaneous See Alternative 09/21/18 0813)  HYDROmorphone (DILAUDID) injection 2 mg (2 mg Intravenous Given 09/21/18 0850)    Or  HYDROmorphone (DILAUDID) injection 2 mg ( Subcutaneous See Alternative 09/21/18 0850)  HYDROmorphone (DILAUDID) injection 2 mg (2 mg Intravenous Given 09/21/18 1110)    Or  HYDROmorphone (DILAUDID) injection 2 mg ( Subcutaneous See Alternative 09/21/18 1110)  promethazine (PHENERGAN) injection 12.5 mg (12.5 mg Intravenous Given 09/21/18 1013)     Initial Impression / Assessment and Plan / ED Course  I have reviewed the triage vital signs and the nursing notes.  Pertinent labs &  imaging results that were available during my care of the patient were reviewed by me and considered in my medical decision making (see chart for details).      Consult: [facility] on call provider.  Recommend normal pain control treatment for sickle cell anemia.  Patient presents with history of sickle cell anemia.  She is [redacted] weeks gestation.  She has no pregnancy related complaints.  She presents with lower extremity pain that she qualifies is typical for sickle cell pain.  This is been reviewed with OB/GYN.  Advised to treat as per normal for sickle cell pain crisis.  Patient has been treated with fluids, Dilaudid, Phenergan.  She has tolerated this well.  She had one episode of nausea.  That is now resolved.  She feels improved and ready to eat.  Patient stable for discharge.  She is counseled on very close follow-up with her OB/GYN provider and her sickle cell provider.  Final Clinical Impressions(s) / ED Diagnoses   Final diagnoses:  Sickle cell pain crisis ([place])  [redacted] weeks gestation of pregnancy    ED Discharge Orders    None       Charlesetta Shanks, MD 09/21/18 1134

## 2018-09-21 NOTE — ED Triage Notes (Signed)
Pt in from home with hx of Sickle Cell, c/o bilateral leg pain and back pain. States she went to [place] last week for same, did not get admitted. C/o worsened pain today. A&ox4, state she is [redacted] wks pregnant, had low Hg at [facility]

## 2018-09-24 ENCOUNTER — Emergency Department (HOSPITAL_COMMUNITY)
Admission: EM | Admit: 2018-09-24 | Discharge: 2018-09-24 | Disposition: A | Discharge status: Home / Self Care | Payer: Medicaid Other | Attending: Emergency Medicine | Admitting: Emergency Medicine

## 2018-09-24 ENCOUNTER — Encounter (HOSPITAL_COMMUNITY): Payer: Self-pay | Admitting: Emergency Medicine

## 2018-09-24 ENCOUNTER — Other Ambulatory Visit: Payer: Self-pay

## 2018-09-24 DIAGNOSIS — R110 Nausea: Secondary | ICD-10-CM | POA: Insufficient documentation

## 2018-09-24 DIAGNOSIS — Z79899 Other long term (current) drug therapy: Secondary | ICD-10-CM | POA: Diagnosis not present

## 2018-09-24 DIAGNOSIS — O26892 Other specified pregnancy related conditions, second trimester: Secondary | ICD-10-CM | POA: Diagnosis not present

## 2018-09-24 DIAGNOSIS — Z3A18 18 weeks gestation of pregnancy: Secondary | ICD-10-CM | POA: Diagnosis not present

## 2018-09-24 DIAGNOSIS — D5700 Hb-SS disease with crisis, unspecified: Secondary | ICD-10-CM | POA: Insufficient documentation

## 2018-09-24 LAB — CBC WITH DIFFERENTIAL/PLATELET
Abs Immature Granulocytes: 0.1 10*3/uL — ABNORMAL HIGH (ref 0.00–0.07)
BASOS PCT: 0 %
Basophils Absolute: 0.1 10*3/uL (ref 0.0–0.1)
EOS ABS: 0.3 10*3/uL (ref 0.0–0.5)
EOS PCT: 2 %
HEMATOCRIT: 23 % — AB (ref 36.0–46.0)
Hemoglobin: 8.2 g/dL — ABNORMAL LOW (ref 12.0–15.0)
Immature Granulocytes: 1 %
LYMPHS ABS: 3.1 10*3/uL (ref 0.7–4.0)
Lymphocytes Relative: 24 %
MCH: 31.7 pg (ref 26.0–34.0)
MCHC: 35.7 g/dL (ref 30.0–36.0)
MCV: 88.8 fL (ref 80.0–100.0)
MONOS PCT: 9 %
Monocytes Absolute: 1.2 10*3/uL — ABNORMAL HIGH (ref 0.1–1.0)
Neutro Abs: 8.3 10*3/uL — ABNORMAL HIGH (ref 1.7–7.7)
Neutrophils Relative %: 64 %
PLATELETS: 519 10*3/uL — AB (ref 150–400)
RBC: 2.59 MIL/uL — ABNORMAL LOW (ref 3.87–5.11)
RDW: 14.4 % (ref 11.5–15.5)
WBC: 13 10*3/uL — ABNORMAL HIGH (ref 4.0–10.5)
nRBC: 1.2 % — ABNORMAL HIGH (ref 0.0–0.2)

## 2018-09-24 LAB — COMPREHENSIVE METABOLIC PANEL
ALBUMIN: 3.4 g/dL — AB (ref 3.5–5.0)
ALK PHOS: 63 U/L (ref 38–126)
ALT: 35 U/L (ref 0–44)
ANION GAP: 6 (ref 5–15)
AST: 28 U/L (ref 15–41)
BUN: 8 mg/dL (ref 6–20)
CALCIUM: 9.1 mg/dL (ref 8.9–10.3)
CHLORIDE: 109 mmol/L (ref 98–111)
CO2: 24 mmol/L (ref 22–32)
Creatinine, Ser: 0.38 mg/dL — ABNORMAL LOW (ref 0.44–1.00)
GFR calc Af Amer: >60 mL/min (ref >60)
GFR calc non Af Amer: >60 mL/min (ref >60)
Glucose, Bld: 95 mg/dL (ref 70–99)
Potassium: 3.7 mmol/L (ref 3.5–5.1)
SODIUM: 139 mmol/L (ref 135–145)
Total Bilirubin: 1.3 mg/dL — ABNORMAL HIGH (ref 0.3–1.2)
Total Protein: 7 g/dL (ref 6.5–8.1)

## 2018-09-24 LAB — RETICULOCYTES
Immature Retic Fract: 42.2 % — ABNORMAL HIGH (ref 2.3–15.9)
RBC.: 2.59 MIL/uL — ABNORMAL LOW (ref 3.87–5.11)
Retic Count, Absolute: 206.4 10*3/uL — ABNORMAL HIGH (ref 19.0–186.0)
Retic Ct Pct: 8 % — ABNORMAL HIGH (ref 0.4–3.1)

## 2018-09-24 MED ORDER — HYDROMORPHONE HCL 2 MG/ML IJ SOLN
2.0000 mg | INTRAMUSCULAR | Status: AC
  Filled 2018-09-24: qty 1

## 2018-09-24 MED ORDER — HYDROMORPHONE HCL 2 MG/ML IJ SOLN: 2.0000 mg | INTRAMUSCULAR | Status: AC

## 2018-09-24 MED ORDER — HYDROMORPHONE HCL 2 MG/ML IJ SOLN
2.0000 mg | Freq: Once | INTRAMUSCULAR | Status: AC
  Administered 2018-09-24: 2 mg via INTRAVENOUS
  Filled 2018-09-24: qty 1

## 2018-09-24 MED ORDER — SODIUM CHLORIDE 0.45 % IV SOLN
INTRAVENOUS | Status: DC
  Administered 2018-09-24: 03:00:00 via INTRAVENOUS

## 2018-09-24 MED ORDER — HYDROMORPHONE HCL 2 MG/ML IJ SOLN
2.0000 mg | INTRAMUSCULAR | Status: AC
  Administered 2018-09-24: 2 mg via INTRAVENOUS
  Filled 2018-09-24: qty 1

## 2018-09-24 MED ORDER — ONDANSETRON HCL 4 MG/2ML IJ SOLN
4.0000 mg | Freq: Once | INTRAMUSCULAR | Status: AC
  Administered 2018-09-24: 4 mg via INTRAVENOUS

## 2018-09-24 MED ORDER — ONDANSETRON HCL 4 MG/2ML IJ SOLN
INTRAMUSCULAR | Status: AC
  Filled 2018-09-24: qty 2

## 2018-09-24 MED ORDER — SODIUM CHLORIDE 0.9 % IV BOLUS
1000.0000 mL | Freq: Once | INTRAVENOUS | Status: AC
  Administered 2018-09-24: 1000 mL via INTRAVENOUS

## 2018-09-24 MED ORDER — HYDROMORPHONE HCL 2 MG/ML IJ SOLN
2.0000 mg | INTRAMUSCULAR | Status: AC
  Administered 2018-09-24: 2 mg via INTRAVENOUS

## 2018-09-24 NOTE — ED Provider Notes (Signed)
[facility] DEPT Provider Note   CSN: [id number] Arrival date & time: 09/24/18  0156     History   Chief Complaint Chief Complaint  Patient presents with  . Sickle Cell Pain Crisis    HPI ATZIRY BARANSKI is a 27 y.o. female.  The history is provided by the patient and medical records.  Sickle Cell Pain Crisis     27 y.o. F with history of sickle cell anemia, presenting to the ED with pain crisis.  States this is been an ongoing issue for about 2 weeks now.  States her pain waxes and wanes in severity but has never really gone away and seems to be migrating around her body.  States this tends to happen when the weather turns off cold.  States currently she has a lot of pain in her low back, legs, and arms which is typical for her.  She does report some intermittent nausea and vomiting but attributes that to her pregnancy.  She is approximately 18 weeks of gestation.  She has not had any complications thus far in her pregnancy and has been compliant with her prenatal care.  She denies any abdominal cramping, feeling of contractions, loss of fluid, or vaginal bleeding.  She has not had any new discharge or urinary symptoms.  She is followed by Dr. Elly Modena and Dr. Rip Harbour at [facility] clinic.  She last took her OxyContin around 8 PM last evening but has not had any relief with this.  Past Medical History:  Diagnosis Date  . Abortion    05/2012  . Headache(784.0)   . Sickle cell crisis [facility])     Patient Active Problem List   Diagnosis Date Noted  . Supervision of high risk pregnancy, antepartum 08/17/2018  . Chronic prescription opiate use 03/13/2018  . Chronic musculoskeletal pain 03/13/2018  . Vitamin D deficiency 01/13/2018  . 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 Anemia of chronic disease   . Sickle cell crisis ([place]) 02/28/2015  . S/P laparoscopic cholecystectomy 11/30/2014  .  Thrombocytosis ([place]) 11/22/2014  . Systolic ejection murmur 49/44/9675    Past Surgical History:  Procedure Laterality Date  . CHOLECYSTECTOMY N/A 11/30/2014   Procedure: LAPAROSCOPIC CHOLECYSTECTOMY SINGLE SITE WITH INTRAOPERATIVE CHOLANGIOGRAM;  Surgeon: Michael Boston, MD;  Location: [facility] ORS;  Service: General;  Laterality: N/A;  . SPLENECTOMY       OB History    Gravida  2   Para      Term      Preterm      AB  1   Living        SAB      TAB  1   Ectopic      Multiple      Live Births               Home Medications    Prior to Admission medications   Medication Sig Start Date End Date Taking? Authorizing Provider  aspirin EC 81 MG tablet Take 1 tablet (81 mg total) by mouth daily. 08/17/18  Yes [name], MD  ergocalciferol (VITAMIN D2) 50000 units capsule Take 1 capsule (50,000 Units total) by mouth once a week. 05/22/18  Yes Tresa Garter, MD  folic acid (FOLVITE) 1 MG tablet Take 1 tablet (1 mg total) by mouth daily. 08/17/18  Yes [name], MD  oxyCODONE (OXYCONTIN) 30 MG 12 hr tablet Take  1 tablet (30 mg total) by mouth every 12 (twelve) hours. Patient taking differently: Take 1 tablet by mouth 2 (two) times daily as needed (pain).  09/15/18 10/15/18 Yes Lanae Boast, FNP  Prenatal Vit-Fe Fumarate-FA (PREPLUS) 27-1 MG TABS Take 1 tablet by mouth daily. 08/17/18  Yes [name], MD  Doxylamine-Pyridoxine 10-10 MG TBEC Take 2 tablets by mouth at bedtime. 2 tabs po QHS x 2 nights. If symptoms persist then 1 tab po Q AM and 2 tabs QHS. Give on empty stomach Patient not taking: Reported on 08/17/2018 07/20/18   Lanae Boast, FNP  Nutritional Supplements (ENSURE HEALTHY MOM) LIQD Consume 2 bottles daily Patient not taking: Reported on 09/21/2018 09/14/18   Constant, Peggy, MD    Family History Family History  Problem Relation Age of Onset  . Hypertension Mother   . Sickle cell anemia Sister   . Kidney disease Sister         Lupus  . Arthritis Sister   . Sickle cell anemia Sister   . Sickle cell trait Sister   . Heart disease Maternal Aunt        CABG  . Heart disease Maternal Uncle        CABG  . Lupus Sister     Social History Social History   Tobacco Use  . Smoking status: Never Smoker  . Smokeless tobacco: Never Used  Substance Use Topics  . Alcohol use: No  . Drug use: No     Allergies   Patient has no known allergies.   Review of Systems Review of Systems  Musculoskeletal: Positive for arthralgias and back pain.  All other systems reviewed and are negative.    Physical Exam Updated Vital Signs BP 108/74 (BP Location: Left Arm)   Pulse 98   Temp 98.7 F (37.1 C) (Oral)   Resp 20   Ht 5\' 3"  (1.6 m)   Wt 72.1 kg   LMP 05/15/2018 (Approximate)   SpO2 98%   BMI 28.17 kg/m   Physical Exam  Constitutional: She is oriented to person, place, and time. She appears well-developed and well-nourished.  HENT:  Head: Normocephalic and atraumatic.  Mouth/Throat: Oropharynx is clear and moist.  Eyes: Pupils are equal, round, and reactive to light. Conjunctivae and EOM are normal.  Neck: Normal range of motion.  Cardiovascular: Normal rate, regular rhythm and normal heart sounds.  Pulmonary/Chest: Effort normal and breath sounds normal.  Abdominal: Soft. Bowel sounds are normal. There is no tenderness. There is no rigidity and no guarding.  Gravid abdomen, non-tender, normal bowel sounds  Musculoskeletal: Normal range of motion.  Neurological: She is alert and oriented to person, place, and time.  Skin: Skin is warm and dry.  Psychiatric: She has a normal mood and affect.  Nursing note and vitals reviewed.    ED Treatments / Results  Labs (all labs ordered are listed, but only abnormal results are displayed) Labs Reviewed  COMPREHENSIVE METABOLIC PANEL - Abnormal; Notable for the following components:      Result Value   Creatinine, Ser 0.38 (*)    Albumin 3.4 (*)    Total  Bilirubin 1.3 (*)    All other components within normal limits  CBC WITH DIFFERENTIAL/PLATELET - Abnormal; Notable for the following components:   WBC 13.0 (*)    RBC 2.59 (*)    Hemoglobin 8.2 (*)    HCT 23.0 (*)    Platelets 519 (*)    nRBC 1.2 (*)  Neutro Abs 8.3 (*)    Monocytes Absolute 1.2 (*)    Abs Immature Granulocytes 0.10 (*)    All other components within normal limits  RETICULOCYTES - Abnormal; Notable for the following components:   Retic Ct Pct 8.0 (*)    RBC. 2.59 (*)    Retic Count, Absolute 206.4 (*)    Immature Retic Fract 42.2 (*)    All other components within normal limits    EKG None  Radiology No results found.  Procedures Procedures (including critical care time)  Medications Ordered in ED Medications  0.45 % sodium chloride infusion ( Intravenous Stopped 09/24/18 0448)  HYDROmorphone (DILAUDID) injection 2 mg (has no administration in time range)  ondansetron (ZOFRAN) 4 MG/2ML injection (has no administration in time range)  HYDROmorphone (DILAUDID) injection 2 mg (2 mg Intravenous Given 09/24/18 0305)    Or  HYDROmorphone (DILAUDID) injection 2 mg ( Subcutaneous See Alternative 09/24/18 0305)  HYDROmorphone (DILAUDID) injection 2 mg (2 mg Intravenous Given 09/24/18 0446)    Or  HYDROmorphone (DILAUDID) injection 2 mg ( Subcutaneous See Alternative 09/24/18 0446)  HYDROmorphone (DILAUDID) injection 2 mg (2 mg Intravenous Given 09/24/18 0403)    Or  HYDROmorphone (DILAUDID) injection 2 mg ( Subcutaneous See Alternative 09/24/18 0403)  sodium chloride 0.9 % bolus 1,000 mL (1,000 mLs Intravenous New Bag/Given 09/24/18 0449)  ondansetron (ZOFRAN) injection 4 mg (4 mg Intravenous Given 09/24/18 0546)     Initial Impression / Assessment and Plan / ED Course  I have reviewed the triage vital signs and the nursing notes.  Pertinent labs & imaging results that were available during my care of the patient were reviewed by me and considered in my  medical decision making (see chart for details).  27 y.o. F here with sickle cell pain crisis.  She is approx [redacted] weeks gestation.  No abdominal pain, vaginal bleeding, cramping, or loss of fluids.  Reports pain throughout arms and legs.  No fever, chills, chest pain, cough, or SOB.  Screening labs reassuring.  Patient has received 3 rounds of pain medications here, rates pain 7/10.  States she feels like if she had one more dose she would be "good to go".  This was ordered.  Prior to additional dose of medications being given, developed some nausea.  No vomiting.  Given dose of zofran.  Awaiting additional meds.  If still having ongoing pain, may need admission for pain control.  If feeling better and pain is controlled, stable for discharge with OP follow-up.    Care signed out to oncoming provider, will reassess after additional dose of medications and disposition appropriately.    Final Clinical Impressions(s) / ED Diagnoses   Final diagnoses:  Sickle cell pain crisis [facility])    ED Discharge Orders    None       Larene Pickett, PA-C 09/24/18 2355    Merryl Hacker, MD 09/24/18 2314

## 2018-09-24 NOTE — ED Triage Notes (Signed)
Pt presents with sickle cell crisis that started 2 weeks ago. Patient states she has been taking her oxycodone at home with no relief. Patient states legs, arms and lower back hurt.

## 2018-09-24 NOTE — Discharge Instructions (Signed)
Continue your home pain medications and prenatal vitamins. Follow-up with your primary care doctor. Return here for any new/acute changes.

## 2018-09-25 DIAGNOSIS — Z3A18 18 weeks gestation of pregnancy: Secondary | ICD-10-CM | POA: Diagnosis not present

## 2018-09-25 DIAGNOSIS — D5700 Hb-SS disease with crisis, unspecified: Secondary | ICD-10-CM | POA: Insufficient documentation

## 2018-09-25 DIAGNOSIS — O99012 Anemia complicating pregnancy, second trimester: Secondary | ICD-10-CM | POA: Insufficient documentation

## 2018-09-25 DIAGNOSIS — Z79899 Other long term (current) drug therapy: Secondary | ICD-10-CM | POA: Diagnosis not present

## 2018-09-25 DIAGNOSIS — Z7982 Long term (current) use of aspirin: Secondary | ICD-10-CM | POA: Insufficient documentation

## 2018-09-25 DIAGNOSIS — M79603 Pain in arm, unspecified: Secondary | ICD-10-CM | POA: Diagnosis present

## 2018-09-26 ENCOUNTER — Emergency Department (HOSPITAL_COMMUNITY)
Admission: EM | Admit: 2018-09-26 | Discharge: 2018-09-26 | Disposition: A | Discharge status: Home / Self Care | Payer: Medicaid Other | Attending: Emergency Medicine | Admitting: Emergency Medicine

## 2018-09-26 ENCOUNTER — Ambulatory Visit (HOSPITAL_COMMUNITY): Admission: RE | Admit: 2018-09-26 | Payer: No Typology Code available for payment source | Source: Ambulatory Visit

## 2018-09-26 ENCOUNTER — Encounter (HOSPITAL_COMMUNITY): Payer: Self-pay | Admitting: Family Medicine

## 2018-09-26 DIAGNOSIS — D5700 Hb-SS disease with crisis, unspecified: Secondary | ICD-10-CM

## 2018-09-26 LAB — COMPREHENSIVE METABOLIC PANEL
ALK PHOS: 49 U/L (ref 38–126)
ALT: 31 U/L (ref 0–44)
AST: 28 U/L (ref 15–41)
Albumin: 3.2 g/dL — ABNORMAL LOW (ref 3.5–5.0)
Anion gap: 9 (ref 5–15)
BUN: 6 mg/dL (ref 6–20)
CALCIUM: 8.5 mg/dL — AB (ref 8.9–10.3)
CO2: 20 mmol/L — AB (ref 22–32)
CREATININE: 0.41 mg/dL — AB (ref 0.44–1.00)
Chloride: 109 mmol/L (ref 98–111)
GFR calc non Af Amer: >60 mL/min (ref >60)
GLUCOSE: 95 mg/dL (ref 70–99)
Potassium: 3.4 mmol/L — ABNORMAL LOW (ref 3.5–5.1)
SODIUM: 138 mmol/L (ref 135–145)
Total Bilirubin: 1.2 mg/dL (ref 0.3–1.2)
Total Protein: 6.5 g/dL (ref 6.5–8.1)

## 2018-09-26 LAB — CBC WITH DIFFERENTIAL/PLATELET
Abs Immature Granulocytes: 0.09 10*3/uL — ABNORMAL HIGH (ref 0.00–0.07)
BASOS ABS: 0 10*3/uL (ref 0.0–0.1)
BASOS PCT: 0 %
EOS ABS: 0.2 10*3/uL (ref 0.0–0.5)
EOS PCT: 2 %
HCT: 21.8 % — ABNORMAL LOW (ref 36.0–46.0)
HEMOGLOBIN: 7.7 g/dL — AB (ref 12.0–15.0)
Immature Granulocytes: 1 %
LYMPHS PCT: 18 %
Lymphs Abs: 2.2 10*3/uL (ref 0.7–4.0)
MCH: 30.8 pg (ref 26.0–34.0)
MCHC: 35.3 g/dL (ref 30.0–36.0)
MCV: 87.2 fL (ref 80.0–100.0)
Monocytes Absolute: 1.3 10*3/uL — ABNORMAL HIGH (ref 0.1–1.0)
Monocytes Relative: 11 %
NRBC: 2.4 % — AB (ref 0.0–0.2)
Neutro Abs: 8.4 10*3/uL — ABNORMAL HIGH (ref 1.7–7.7)
Neutrophils Relative %: 68 %
Platelets: 508 10*3/uL — ABNORMAL HIGH (ref 150–400)
RBC: 2.5 MIL/uL — AB (ref 3.87–5.11)
RDW: 14.4 % (ref 11.5–15.5)
WBC: 12.3 10*3/uL — ABNORMAL HIGH (ref 4.0–10.5)

## 2018-09-26 LAB — RETICULOCYTES
Immature Retic Fract: 34.3 % — ABNORMAL HIGH (ref 2.3–15.9)
RBC.: 2.5 MIL/uL — ABNORMAL LOW (ref 3.87–5.11)
RETIC CT PCT: 8.3 % — AB (ref 0.4–3.1)
Retic Count, Absolute: 208.5 10*3/uL — ABNORMAL HIGH (ref 19.0–186.0)

## 2018-09-26 MED ORDER — HYDROMORPHONE HCL 1 MG/ML IJ SOLN
1.0000 mg | Freq: Once | INTRAMUSCULAR | Status: AC
  Administered 2018-09-26: 1 mg via INTRAVENOUS
  Filled 2018-09-26: qty 1

## 2018-09-26 MED ORDER — SODIUM CHLORIDE 0.45 % IV SOLN
INTRAVENOUS | Status: DC
  Administered 2018-09-26: 05:00:00 via INTRAVENOUS

## 2018-09-26 MED ORDER — HYDROMORPHONE HCL 2 MG/ML IJ SOLN: 2.0000 mg | INTRAMUSCULAR | Status: AC

## 2018-09-26 MED ORDER — HYDROMORPHONE HCL 2 MG/ML IJ SOLN
2.0000 mg | INTRAMUSCULAR | Status: AC
  Administered 2018-09-26: 2 mg via INTRAVENOUS
  Filled 2018-09-26 (×2): qty 1

## 2018-09-26 MED ORDER — HYDROMORPHONE HCL 2 MG/ML IJ SOLN
2.0000 mg | INTRAMUSCULAR | Status: AC
  Administered 2018-09-26: 2 mg via INTRAVENOUS
  Filled 2018-09-26: qty 1

## 2018-09-26 MED ORDER — ONDANSETRON HCL 4 MG/2ML IJ SOLN
4.0000 mg | Freq: Three times a day (TID) | INTRAMUSCULAR | Status: DC | PRN
  Administered 2018-09-26: 4 mg via INTRAVENOUS
  Filled 2018-09-26: qty 2

## 2018-09-26 MED ORDER — HYDROMORPHONE HCL 2 MG/ML IJ SOLN
2.0000 mg | INTRAMUSCULAR | Status: AC
  Administered 2018-09-26: 2 mg via INTRAVENOUS

## 2018-09-26 NOTE — ED Notes (Signed)
Attempted to start PIV , pt. Yelled to this Nurse to pull out the IV cath and called this Nurse "mother fucker". Pt. Won't let this Nurse tread in the IV cath and told this nurse to get someone else . Charge Nurse notified.

## 2018-09-26 NOTE — ED Triage Notes (Signed)
Patient states she started experienicing pain in her legs and arms about two weeks ago. Patient has been seen 4 other times in the last 2 weeks for pain. Patient reports she is [redacted] weeks pregnant with no abd pain or vaginal pain. Last took last dose of pain medication at 2pm. This nurse observed patient in the lobby, smiling and talking comfortably with visitor while calling other patients to assigned rooms.

## 2018-09-26 NOTE — ED Notes (Signed)
This nurse spoke with patient stating I have placed an order for IV team to come try and get an IV but I can give her the subcutaneous injection while she waits. Pt verbally upset about waiting long enough, but states she can wait for IV team to come and does not want the injections. This nurse offered a drink or snack to patient and guest both declined at this time. Call bell given and told if she can let me know if she needs anything or changes her mind about waiting. Pt verbalized understanding and expressed frustration.

## 2018-09-26 NOTE — Discharge Instructions (Signed)
Follow-up with your primary care doctor. Can also follow-up with the sickle cell clinic. Return here for new/acute changes.

## 2018-09-26 NOTE — ED Notes (Signed)
Patient does not want blood drawn until IV is started.

## 2018-09-26 NOTE — ED Provider Notes (Signed)
[place] DEPT Provider Note   CSN: [id number] Arrival date & time: 09/25/18  2341     History   Chief Complaint Chief Complaint  Patient presents with  . Sickle Cell Pain Crisis    HPI Carmen [name] is a 27 y.o. female.  The history is provided by the patient and medical records.  Sickle Cell Pain Crisis     27 year old female with history of sickle cell anemia, vitamin D deficiency, presenting to the ED with sickle cell pain crisis.  I personally evaluated patient in the ED 2 days ago for same.  She reports ongoing pain in her arms and legs, states her back is feeling better however.  She denies any new numbness or weakness.  No injury, trauma, or falls.  She denies any abdominal pain, vaginal bleeding, loss of fluid.  She is approximately [redacted] weeks gestation, no issues thus far in pregnancy.  She has been feeling appropriate movement.  Has been taking her home OxyContin without relief.  Past Medical History:  Diagnosis Date  . Abortion    05/2012  . Headache(784.0)   . Sickle cell crisis [facility])     Patient Active Problem List   Diagnosis Date Noted  . Supervision of high risk pregnancy, antepartum 08/17/2018  . Chronic prescription opiate use 03/13/2018  . Chronic musculoskeletal pain 03/13/2018  . Vitamin D deficiency 01/13/2018  . 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 Anemia of chronic disease   . Sickle cell crisis ([place]) 02/28/2015  . S/P laparoscopic cholecystectomy 11/30/2014  . Thrombocytosis ([place]) 11/22/2014  . Systolic ejection murmur 94/76/5465    Past Surgical History:  Procedure Laterality Date  . CHOLECYSTECTOMY N/A 11/30/2014   Procedure: LAPAROSCOPIC CHOLECYSTECTOMY SINGLE SITE WITH INTRAOPERATIVE CHOLANGIOGRAM;  Surgeon: Michael Boston, MD;  Location: [facility] ORS;  Service: General;  Laterality: N/A;  . SPLENECTOMY       OB History    Gravida  2   Para      Term      Preterm      AB  1   Living        SAB      TAB  1   Ectopic      Multiple      Live Births               Home Medications    Prior to Admission medications   Medication Sig Start Date End Date Taking? Authorizing Provider  aspirin EC 81 MG tablet Take 1 tablet (81 mg total) by mouth daily. 08/17/18   [name], MD  Doxylamine-Pyridoxine 10-10 MG TBEC Take 2 tablets by mouth at bedtime. 2 tabs po QHS x 2 nights. If symptoms persist then 1 tab po Q AM and 2 tabs QHS. Give on empty stomach Patient not taking: Reported on 08/17/2018 07/20/18   Lanae Boast, FNP  ergocalciferol (VITAMIN D2) 50000 units capsule Take 1 capsule (50,000 Units total) by mouth once a week. 05/22/18   Tresa Garter, MD  folic acid (FOLVITE) 1 MG tablet Take 1 tablet (1 mg total) by mouth daily. 08/17/18   [name], MD  Nutritional Supplements (ENSURE HEALTHY MOM) LIQD Consume 2 bottles daily Patient not taking: Reported on 09/21/2018 09/14/18   Constant, Peggy, MD  oxyCODONE (OXYCONTIN) 30 MG 12 hr tablet Take 1 tablet (30 mg total) by mouth every 12 (twelve) hours. Patient  taking differently: Take 1 tablet by mouth 2 (two) times daily as needed (pain).  09/15/18 10/15/18  Lanae Boast, FNP  Prenatal Vit-Fe Fumarate-FA (PREPLUS) 27-1 MG TABS Take 1 tablet by mouth daily. 08/17/18   [name], MD    Family History Family History  Problem Relation Age of Onset  . Hypertension Mother   . Sickle cell anemia Sister   . Kidney disease Sister        Lupus  . Arthritis Sister   . Sickle cell anemia Sister   . Sickle cell trait Sister   . Heart disease Maternal Aunt        CABG  . Heart disease Maternal Uncle        CABG  . Lupus Sister     Social History Social History   Tobacco Use  . Smoking status: Never Smoker  . Smokeless tobacco: Never Used  Substance Use Topics  . Alcohol use: No  . Drug use: No     Allergies   Patient has  no known allergies.   Review of Systems Review of Systems  Musculoskeletal: Positive for arthralgias.  All other systems reviewed and are negative.    Physical Exam Updated Vital Signs BP 114/70 (BP Location: Left Arm)   Pulse 90   Temp 98.6 F (37 C) (Oral)   Ht 5\' 3"  (1.6 m)   Wt 72.6 kg   LMP 05/15/2018 (Approximate)   SpO2 100%   BMI 28.34 kg/m   Physical Exam  Constitutional: She is oriented to person, place, and time. She appears well-developed and well-nourished.  NAD, significant other at bedside  HENT:  Head: Normocephalic and atraumatic.  Mouth/Throat: Oropharynx is clear and moist.  Eyes: Pupils are equal, round, and reactive to light. Conjunctivae and EOM are normal.  Neck: Normal range of motion.  Cardiovascular: Normal rate, regular rhythm and normal heart sounds.  Pulmonary/Chest: Effort normal and breath sounds normal.  Abdominal: Soft. Bowel sounds are normal.  Gravid abdomen FHR assessed during exam by myself-- rate 135  Musculoskeletal: Normal range of motion.  Neurological: She is alert and oriented to person, place, and time.  Skin: Skin is warm and dry.  Psychiatric: She has a normal mood and affect.  Nursing note and vitals reviewed.    ED Treatments / Results  Labs (all labs ordered are listed, but only abnormal results are displayed) Labs Reviewed  COMPREHENSIVE METABOLIC PANEL - Abnormal; Notable for the following components:      Result Value   Potassium 3.4 (*)    CO2 20 (*)    Creatinine, Ser 0.41 (*)    Calcium 8.5 (*)    Albumin 3.2 (*)    All other components within normal limits  CBC WITH DIFFERENTIAL/PLATELET - Abnormal; Notable for the following components:   WBC 12.3 (*)    RBC 2.50 (*)    Hemoglobin 7.7 (*)    HCT 21.8 (*)    Platelets 508 (*)    nRBC 2.4 (*)    Neutro Abs 8.4 (*)    Monocytes Absolute 1.3 (*)    Abs Immature Granulocytes 0.09 (*)    All other components within normal limits  RETICULOCYTES -  Abnormal; Notable for the following components:   Retic Ct Pct 8.3 (*)    RBC. 2.50 (*)    Retic Count, Absolute 208.5 (*)    Immature Retic Fract 34.3 (*)    All other components within normal limits    EKG None  Radiology No results found.  Procedures Procedures (including critical care time)  Medications Ordered in ED Medications  0.45 % sodium chloride infusion ( Intravenous New Bag/Given 09/26/18 0457)  ondansetron (ZOFRAN) injection 4 mg (4 mg Intravenous Given 09/26/18 0458)  HYDROmorphone (DILAUDID) injection 1 mg (has no administration in time range)  HYDROmorphone (DILAUDID) injection 2 mg (2 mg Intravenous Given 09/26/18 0457)    Or  HYDROmorphone (DILAUDID) injection 2 mg ( Subcutaneous See Alternative 09/26/18 0457)  HYDROmorphone (DILAUDID) injection 2 mg (2 mg Intravenous Given 09/26/18 4967)    Or  HYDROmorphone (DILAUDID) injection 2 mg ( Subcutaneous See Alternative 09/26/18 0619)  HYDROmorphone (DILAUDID) injection 2 mg (2 mg Intravenous Given 09/26/18 0537)    Or  HYDROmorphone (DILAUDID) injection 2 mg ( Subcutaneous See Alternative 09/26/18 0537)     Initial Impression / Assessment and Plan / ED Course  I have reviewed the triage vital signs and the nursing notes.  Pertinent labs & imaging results that were available during my care of the patient were reviewed by me and considered in my medical decision making (see chart for details).  27 year old female here with sickle cell pain crisis.  I personally evaluated patient for same 2 days ago.  States "it is just not breaking".  She reports continued pain in her arms and legs.  She is currently [redacted] weeks pregnant but denies any issues with baby.  She has not had any abdominal pain, cramping, sensation of contractions, vaginal bleeding, discharge, or loss of fluid.  She denies any fever, chills, chest pain, cough, or shortness of breath.  I checked FHR during initial assessment, 135.  Patient is feeling  movement.    Delay in obtaining IV access and drawing labs as patient refusing peripheral IV stick without ultrasound guidance.  First nurse tried and establish access, however patient began complaining and screaming at her.  Eventually IV access was established and patient is receiving IV fluids and pain medications.  Labs are reassuring.  Pain controlled with medications here.  Stable for discharge.  Patient has been seen multiple times in the ED over the past 2 weeks, I recommended that she follow-up with her primary care doctor.  Advised her she may also go to the sickle cell clinic.  She can return here for any new or acute changes.  Final Clinical Impressions(s) / ED Diagnoses   Final diagnoses:  Sickle cell pain crisis [facility])    ED Discharge Orders    None       Larene Pickett, PA-C 09/26/18 5916    Merryl Hacker, MD 09/26/18 [id number]

## 2018-09-29 ENCOUNTER — Other Ambulatory Visit: Payer: Self-pay

## 2018-09-29 ENCOUNTER — Encounter (HOSPITAL_COMMUNITY): Payer: Self-pay | Admitting: Emergency Medicine

## 2018-09-29 ENCOUNTER — Emergency Department (HOSPITAL_COMMUNITY)
Admission: EM | Admit: 2018-09-29 | Discharge: 2018-09-29 | Disposition: A | Discharge status: Home / Self Care | Payer: Medicaid Other | Attending: Emergency Medicine | Admitting: Emergency Medicine

## 2018-09-29 DIAGNOSIS — Z3A19 19 weeks gestation of pregnancy: Secondary | ICD-10-CM | POA: Diagnosis not present

## 2018-09-29 DIAGNOSIS — D5700 Hb-SS disease with crisis, unspecified: Secondary | ICD-10-CM | POA: Insufficient documentation

## 2018-09-29 DIAGNOSIS — O99012 Anemia complicating pregnancy, second trimester: Secondary | ICD-10-CM | POA: Diagnosis not present

## 2018-09-29 LAB — COMPREHENSIVE METABOLIC PANEL
ALT: 24 U/L (ref 0–44)
ANION GAP: 9 (ref 5–15)
AST: 24 U/L (ref 15–41)
Albumin: 3.6 g/dL (ref 3.5–5.0)
Alkaline Phosphatase: 63 U/L (ref 38–126)
BUN: 11 mg/dL (ref 6–20)
CO2: 22 mmol/L (ref 22–32)
Calcium: 9.3 mg/dL (ref 8.9–10.3)
Chloride: 109 mmol/L (ref 98–111)
Creatinine, Ser: 0.46 mg/dL (ref 0.44–1.00)
GFR calc Af Amer: >60 mL/min (ref >60)
Glucose, Bld: 90 mg/dL (ref 70–99)
Potassium: 3.9 mmol/L (ref 3.5–5.1)
Sodium: 140 mmol/L (ref 135–145)
TOTAL PROTEIN: 7.3 g/dL (ref 6.5–8.1)
Total Bilirubin: 1.2 mg/dL (ref 0.3–1.2)

## 2018-09-29 LAB — RETICULOCYTES
Immature Retic Fract: 35.8 % — ABNORMAL HIGH (ref 2.3–15.9)
RBC.: 2.62 MIL/uL — ABNORMAL LOW (ref 3.87–5.11)
Retic Count, Absolute: 180.5 10*3/uL (ref 19.0–186.0)
Retic Ct Pct: 6.9 % — ABNORMAL HIGH (ref 0.4–3.1)

## 2018-09-29 LAB — CBC WITH DIFFERENTIAL/PLATELET
Abs Immature Granulocytes: 0.11 10*3/uL — ABNORMAL HIGH (ref 0.00–0.07)
Basophils Absolute: 0 10*3/uL (ref 0.0–0.1)
Basophils Relative: 0 %
Eosinophils Absolute: 0.1 10*3/uL (ref 0.0–0.5)
Eosinophils Relative: 1 %
HEMATOCRIT: 22.8 % — AB (ref 36.0–46.0)
Hemoglobin: 8 g/dL — ABNORMAL LOW (ref 12.0–15.0)
IMMATURE GRANULOCYTES: 1 %
Lymphocytes Relative: 9 %
Lymphs Abs: 1.3 10*3/uL (ref 0.7–4.0)
MCH: 30.5 pg (ref 26.0–34.0)
MCHC: 35.1 g/dL (ref 30.0–36.0)
MCV: 87 fL (ref 80.0–100.0)
Monocytes Absolute: 1.2 10*3/uL — ABNORMAL HIGH (ref 0.1–1.0)
Monocytes Relative: 8 %
NEUTROS ABS: 12.2 10*3/uL — AB (ref 1.7–7.7)
NEUTROS PCT: 81 %
NRBC: 1.1 % — AB (ref 0.0–0.2)
Platelets: 504 10*3/uL — ABNORMAL HIGH (ref 150–400)
RBC: 2.62 MIL/uL — AB (ref 3.87–5.11)
RDW: 14.2 % (ref 11.5–15.5)
WBC: 15 10*3/uL — AB (ref 4.0–10.5)

## 2018-09-29 LAB — I-STAT BETA HCG BLOOD, ED (MC, WL, AP ONLY)

## 2018-09-29 MED ORDER — HYDROMORPHONE HCL 2 MG/ML IJ SOLN: 2.0000 mg | INTRAMUSCULAR | Status: AC

## 2018-09-29 MED ORDER — DIPHENHYDRAMINE HCL 25 MG PO CAPS: 25.0000 mg | ORAL_CAPSULE | ORAL | Status: DC | PRN

## 2018-09-29 MED ORDER — HYDROMORPHONE HCL 2 MG/ML IJ SOLN
2.0000 mg | INTRAMUSCULAR | Status: AC
  Administered 2018-09-29: 2 mg via INTRAVENOUS

## 2018-09-29 MED ORDER — HYDROMORPHONE HCL 2 MG/ML IJ SOLN
2.0000 mg | INTRAMUSCULAR | Status: AC
  Filled 2018-09-29: qty 1

## 2018-09-29 MED ORDER — ONDANSETRON HCL 4 MG/2ML IJ SOLN: 4.0000 mg | INTRAMUSCULAR | Status: DC | PRN

## 2018-09-29 MED ORDER — HYDROMORPHONE HCL 2 MG/ML IJ SOLN
2.0000 mg | INTRAMUSCULAR | Status: AC
  Administered 2018-09-29: 2 mg via INTRAVENOUS
  Filled 2018-09-29 (×2): qty 1

## 2018-09-29 MED ORDER — HYDROMORPHONE HCL 2 MG/ML IJ SOLN
2.0000 mg | INTRAMUSCULAR | Status: AC
  Administered 2018-09-29 (×2): 2 mg via INTRAVENOUS
  Filled 2018-09-29: qty 1

## 2018-09-29 MED ORDER — HYDROMORPHONE HCL 2 MG/ML IJ SOLN
2.0000 mg | INTRAMUSCULAR | Status: AC
  Administered 2018-09-29: 2 mg via INTRAVENOUS
  Filled 2018-09-29: qty 1

## 2018-09-29 NOTE — ED Notes (Signed)
Bed: PN36 Expected date:  Expected time:  Means of arrival:  Comments: Vomiting fever

## 2018-09-29 NOTE — ED Provider Notes (Signed)
10:40 AM, reevaluation for stability and possible discharge.  The patient presented for pain consistent with sickle cell crisis.  She was comprehensively evaluated.   Clinical Course as of Sep 29 1205  Fri Sep 29, 2018  1042 Elevated  I-Stat beta hCG blood, ED(!) [EW]  1042 Normal except white count high, hemoglobin low  CBC with Differential(!) [EW]  1043 Normal  Comprehensive metabolic panel [EW]  5003 Elevated  Reticulocytes(!) [EW]    Clinical Course User Index [EW] Daleen Bo, MD     Patient Vitals for the past 24 hrs:  BP Temp Temp src Pulse Resp SpO2 Weight  09/29/18 1152 (!) 103/55 - - 87 16 100 % -  09/29/18 1030 (!) 103/55 - - 87 - 100 % -  09/29/18 1028 (!) 95/57 - - 84 16 100 % -  09/29/18 1000 (!) 95/57 - - 84 - 100 % -  09/29/18 0930 (!) 97/57 - - - - - -  09/29/18 0922 (!) 97/48 - - 77 16 98 % -  09/29/18 0900 (!) 97/48 - - 75 - 98 % -  09/29/18 0830 101/65 - - 95 11 98 % -  09/29/18 0820 (!) 101/53 - - 95 18 100 % -  09/29/18 0730 (!) 93/53 - - 85 18 97 % -  09/29/18 0700 110/61 - - (!) 102 18 98 % -  09/29/18 0639 111/69 - - (!) 104 14 99 % -  09/29/18 0532 108/65 - - (!) 113 15 99 % -  09/29/18 0522 - - - - - - 74.4 kg  09/29/18 0405 114/68 98.1 F (36.7 C) Oral (!) 119 16 100 % -    10:48 AM Reevaluation with update and discussion. After initial assessment and treatment, an updated evaluation reveals patient is comfortable.  She is ambulating now without discomfort.  I discussed the findings with her.  She began to tear up when talking about the pregnancy.  This is her first pregnancy that she has carried through.Daleen Bo   Medical Decision Making: Patient with sickle cell crisis, and reassuring course.  Hemoglobin is stable, doubt significant hemolytic crisis.  Patient is receiving ongoing management for second trimester pregnancy.  She is being followed closely in the high risk OB clinic.  CRITICAL CARE-no Performed by: Daleen Bo    Nursing Notes Reviewed/ Care Coordinated Applicable Imaging Reviewed Interpretation of Laboratory Data incorporated into ED treatment  The patient appears reasonably screened and/or stabilized for discharge and I doubt any other medical condition or other [facility] requiring further screening, evaluation, or treatment in the ED at this time prior to discharge.  Plan: Home Medications-continue usual medications; Home Treatments-rest, gradually advance diet and activity; return here if the recommended treatment, does not improve the symptoms; Recommended follow up-PCP and OB follow-up as scheduled, and as needed      Daleen Bo, MD 09/29/18 2036

## 2018-09-29 NOTE — ED Notes (Signed)
Patient requested ultrasound IV, IV team nurse and the patient had a verbal altercation last week and patient refuses IV team nurse's care, patient understands that she can have subq Dilaudid, but wants to wait for an ultrasound IV because the subq shots "hurt."

## 2018-09-29 NOTE — ED Provider Notes (Addendum)
[facility] [facility] DEPT Provider Note   CSN: [id number] Arrival date & time: 09/29/18  0355     History   Chief Complaint Chief Complaint  Patient presents with  . Sickle Cell Pain Crisis    HPI Carmen [name] is a 27 y.o. female.  HPI  27 year old G2, P38 female comes in with chief complaint of sickle cell pain. Patient reports that her pain started yesterday evening.  He was seen in the ER 3 days ago and was discharged after her pain is improved.  She thinks that after she had been out for skiing with her family she started having pain because of the cold weather.  Her pain is typical of her sickle cell crisis and is located in the typical locations, which for her are back and lower extremities.  Patient denies any recent infection and has no URI-like symptoms or UTI-like symptoms.  Patient is [redacted] weeks pregnant and denies any cramping pain, vaginal discharge or bleeding.   Patient took her 30 mg oxycodone at home without any relief.  She has agreed to getting IV opioids for pain control, understanding and accepting the potential risk to her unborn child.  Past Medical History:  Diagnosis Date  . Abortion    05/2012  . Headache(784.0)   . Sickle cell crisis [facility])     Patient Active Problem List   Diagnosis Date Noted  . Supervision of high risk pregnancy, antepartum 08/17/2018  . Chronic prescription opiate use 03/13/2018  . Chronic musculoskeletal pain 03/13/2018  . Vitamin D deficiency 01/13/2018  . 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[place]) 04/05/2017  . 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 Anemia of chronic disease   . Sickle cell crisis ([place]) 02/28/2015  . S/P laparoscopic cholecystectomy 11/30/2014  . Thrombocytosis ([place]) 11/22/2014  . Systolic ejection murmur 17/79/3903    Past Surgical History:  Procedure Laterality Date  . CHOLECYSTECTOMY N/A 11/30/2014   Procedure: LAPAROSCOPIC CHOLECYSTECTOMY SINGLE SITE WITH  INTRAOPERATIVE CHOLANGIOGRAM;  Surgeon: Michael Boston, MD;  Location: [facility] ORS;  Service: General;  Laterality: N/A;  . SPLENECTOMY       OB History    Gravida  2   Para      Term      Preterm      AB  1   Living        SAB      TAB  1   Ectopic      Multiple      Live Births               Home Medications    Prior to Admission medications   Medication Sig Start Date End Date Taking? Authorizing Provider  aspirin EC 81 MG tablet Take 1 tablet (81 mg total) by mouth daily. 08/17/18  Yes [name], MD  ergocalciferol (VITAMIN D2) 50000 units capsule Take 1 capsule (50,000 Units total) by mouth once a week. 05/22/18  Yes Tresa Garter, MD  folic acid (FOLVITE) 1 MG tablet Take 1 tablet (1 mg total) by mouth daily. 08/17/18  Yes [name], MD  oxyCODONE (OXYCONTIN) 30 MG 12 hr tablet Take 1 tablet (30 mg total) by mouth every 12 (twelve) hours. Patient taking differently: Take 1 tablet by mouth 2 (two) times daily as needed (pain).  09/15/18 10/15/18 Yes Lanae Boast, FNP  Prenatal Vit-Fe Fumarate-FA (PREPLUS) 27-1 MG TABS Take 1 tablet by mouth daily. 08/17/18  Yes [name],  MD  Doxylamine-Pyridoxine 10-10 MG TBEC Take 2 tablets by mouth at bedtime. 2 tabs po QHS x 2 nights. If symptoms persist then 1 tab po Q AM and 2 tabs QHS. Give on empty stomach Patient not taking: Reported on 08/17/2018 07/20/18   Lanae Boast, FNP  Nutritional Supplements (ENSURE HEALTHY MOM) LIQD Consume 2 bottles daily Patient not taking: Reported on 09/21/2018 09/14/18   Constant, Peggy, MD    Family History Family History  Problem Relation Age of Onset  . Hypertension Mother   . Sickle cell anemia Sister   . Kidney disease Sister        Lupus  . Arthritis Sister   . Sickle cell anemia Sister   . Sickle cell trait Sister   . Heart disease Maternal Aunt        CABG  . Heart disease Maternal Uncle        CABG  . Lupus Sister     Social History Social  History   Tobacco Use  . Smoking status: Never Smoker  . Smokeless tobacco: Never Used  Substance Use Topics  . Alcohol use: No  . Drug use: No     Allergies   Patient has no known allergies.   Review of Systems Review of Systems  Constitutional: Positive for activity change.  Musculoskeletal: Positive for myalgias.  All other systems reviewed and are negative.    Physical Exam Updated Vital Signs BP (!) 103/55   Pulse 87   Temp 98 F (36.7 C) (Oral)   Resp 16   Wt 74.4 kg   LMP 05/15/2018 (Approximate)   SpO2 100%   BMI 29.06 kg/m   Physical Exam  Constitutional: She is oriented to person, place, and time. She appears well-developed.  HENT:  Head: Normocephalic and atraumatic.  Eyes: EOM are normal.  Neck: Normal range of motion. Neck supple.  Cardiovascular: Normal rate.  Pulmonary/Chest: Effort normal.  Abdominal: Bowel sounds are normal.  Musculoskeletal: She exhibits no deformity.  Range of motion over the hip and knee is intact.  Neurological: She is alert and oriented to person, place, and time.  Skin: Skin is warm and dry.  Nursing note and vitals reviewed.    ED Treatments / Results  Labs (all labs ordered are listed, but only abnormal results are displayed) Labs Reviewed  CBC WITH DIFFERENTIAL/PLATELET - Abnormal; Notable for the following components:      Result Value   WBC 15.0 (*)    RBC 2.62 (*)    Hemoglobin 8.0 (*)    HCT 22.8 (*)    Platelets 504 (*)    nRBC 1.1 (*)    Neutro Abs 12.2 (*)    Monocytes Absolute 1.2 (*)    Abs Immature Granulocytes 0.11 (*)    All other components within normal limits  RETICULOCYTES - Abnormal; Notable for the following components:   Retic Ct Pct 6.9 (*)    RBC. 2.62 (*)    Immature Retic Fract 35.8 (*)    All other components within normal limits  I-STAT BETA HCG BLOOD, ED ([facility], [facility], [facility] ONLY) - Abnormal; Notable for the following components:   I-stat hCG, quantitative >2,000.0 (*)    All other  components within normal limits  COMPREHENSIVE METABOLIC PANEL    EKG None  Radiology No results found.  Procedures Procedures (including critical care time)  Medications Ordered in ED Medications  HYDROmorphone (DILAUDID) injection 2 mg (2 mg Intravenous Given 09/29/18 0708)    Or  HYDROmorphone (DILAUDID) injection 2 mg ( Subcutaneous See Alternative 09/29/18 0708)  HYDROmorphone (DILAUDID) injection 2 mg (2 mg Intravenous Given 09/29/18 4462)    Or  HYDROmorphone (DILAUDID) injection 2 mg ( Subcutaneous See Alternative 09/29/18 0637)  HYDROmorphone (DILAUDID) injection 2 mg (2 mg Intravenous Given 09/29/18 0601)    Or  HYDROmorphone (DILAUDID) injection 2 mg ( Subcutaneous See Alternative 09/29/18 0601)  HYDROmorphone (DILAUDID) injection 2 mg (2 mg Intravenous Given 09/29/18 1041)    Or  HYDROmorphone (DILAUDID) injection 2 mg ( Subcutaneous See Alternative 09/29/18 1041)     Initial Impression / Assessment and Plan / ED Course  I have reviewed the triage vital signs and the nursing notes.  Pertinent labs & imaging results that were available during my care of the patient were reviewed by me and considered in my medical decision making (see chart for details).  Clinical Course as of Sep 30 709  Fri Sep 29, 2018  1042 Elevated  I-Stat beta hCG blood, ED(!) [EW]  1042 Normal except white count high, hemoglobin low  CBC with Differential(!) [EW]  1043 Normal  Comprehensive metabolic panel [EW]  8638 Elevated  Reticulocytes(!) [EW]  1206 Fetal heart tones 160, this is reassuring.   [EW]    Clinical Course User Index [EW] Daleen Bo, MD    27 year old G2, P53 female comes in with chief complaint of sickle cell pain crisis.  She is about [redacted] weeks pregnant and does not have any pregnancy related complaints right now.  Her pain is typical of her sickle cell pain crisis and it appears that the cold weather is the trigger.  Appropriate pain medications ordered.   Patient will be reassessed around 8:00, and if she has persistent pain then she will be admitted by the incoming team.  Final Clinical Impressions(s) / ED Diagnoses   Final diagnoses:  Sickle cell pain crisis ([place])  [redacted] weeks gestation of pregnancy    ED Discharge Orders    None          Varney Biles, MD 09/30/18 0710    Varney Biles, MD 09/30/18 0710

## 2018-09-29 NOTE — Discharge Instructions (Addendum)
Follow-up with your [facility] doctor as scheduled.  See your sickle cell provider for problems.

## 2018-09-29 NOTE — ED Triage Notes (Signed)
Pt from home with c/o sickle cell pain in back and knees. Pt states this episode has been ongoing x 2 weeks. Pt states she has been getting better, but the woke up with increased pain. Pt denies SOB or CP. Pt is also [redacted] weeks pregnant. Pt denies abdominal pain or vaginal discharge.

## 2018-10-04 ENCOUNTER — Other Ambulatory Visit: Payer: Self-pay | Admitting: Obstetrics and Gynecology

## 2018-10-04 ENCOUNTER — Ambulatory Visit (HOSPITAL_COMMUNITY)
Admission: RE | Admit: 2018-10-04 | Discharge: 2018-10-04 | Disposition: A | Discharge status: Home / Self Care | Payer: Medicaid Other | Source: Ambulatory Visit | Attending: Obstetrics and Gynecology | Admitting: Obstetrics and Gynecology

## 2018-10-04 ENCOUNTER — Encounter (HOSPITAL_COMMUNITY): Payer: Self-pay

## 2018-10-04 DIAGNOSIS — D571 Sickle-cell disease without crisis: Secondary | ICD-10-CM | POA: Diagnosis not present

## 2018-10-04 DIAGNOSIS — Z3A20 20 weeks gestation of pregnancy: Secondary | ICD-10-CM | POA: Insufficient documentation

## 2018-10-04 DIAGNOSIS — O99322 Drug use complicating pregnancy, second trimester: Secondary | ICD-10-CM

## 2018-10-04 DIAGNOSIS — O0990 Supervision of high risk pregnancy, unspecified, unspecified trimester: Secondary | ICD-10-CM

## 2018-10-04 DIAGNOSIS — O99012 Anemia complicating pregnancy, second trimester: Secondary | ICD-10-CM | POA: Insufficient documentation

## 2018-10-04 DIAGNOSIS — O99013 Anemia complicating pregnancy, third trimester: Secondary | ICD-10-CM

## 2018-10-04 DIAGNOSIS — Z363 Encounter for antenatal screening for malformations: Secondary | ICD-10-CM

## 2018-10-04 NOTE — ED Notes (Signed)
Patient agreed to have labs drawn as recommended by genetic counselor, but when RN went to U/s room to get the patient, she was gone. No one saw her leave.

## 2018-10-06 ENCOUNTER — Ambulatory Visit (HOSPITAL_COMMUNITY)
Admission: RE | Admit: 2018-10-06 | Discharge: 2018-10-06 | Disposition: A | Discharge status: Home / Self Care | Payer: No Typology Code available for payment source | Source: Ambulatory Visit | Attending: Obstetrics and Gynecology | Admitting: Obstetrics and Gynecology

## 2018-10-06 ENCOUNTER — Encounter (HOSPITAL_COMMUNITY): Payer: Self-pay

## 2018-10-07 ENCOUNTER — Inpatient Hospital Stay (HOSPITAL_COMMUNITY)
Admission: EM | Admit: 2018-10-07 | Discharge: 2018-10-13 | DRG: 812 | Disposition: A | Discharge status: Home / Self Care | Payer: Medicaid Other | Attending: Internal Medicine | Admitting: Internal Medicine

## 2018-10-07 ENCOUNTER — Encounter (HOSPITAL_COMMUNITY): Payer: Self-pay

## 2018-10-07 ENCOUNTER — Other Ambulatory Visit: Payer: Self-pay

## 2018-10-07 DIAGNOSIS — O99012 Anemia complicating pregnancy, second trimester: Secondary | ICD-10-CM | POA: Diagnosis present

## 2018-10-07 DIAGNOSIS — O99112 Other diseases of the blood and blood-forming organs and certain disorders involving the immune mechanism complicating pregnancy, second trimester: Secondary | ICD-10-CM | POA: Diagnosis present

## 2018-10-07 DIAGNOSIS — O99352 Diseases of the nervous system complicating pregnancy, second trimester: Secondary | ICD-10-CM | POA: Diagnosis present

## 2018-10-07 DIAGNOSIS — D72829 Elevated white blood cell count, unspecified: Secondary | ICD-10-CM | POA: Diagnosis present

## 2018-10-07 DIAGNOSIS — D638 Anemia in other chronic diseases classified elsewhere: Secondary | ICD-10-CM | POA: Diagnosis not present

## 2018-10-07 DIAGNOSIS — O212 Late vomiting of pregnancy: Secondary | ICD-10-CM | POA: Diagnosis present

## 2018-10-07 DIAGNOSIS — Z832 Family history of diseases of the blood and blood-forming organs and certain disorders involving the immune mechanism: Secondary | ICD-10-CM

## 2018-10-07 DIAGNOSIS — E876 Hypokalemia: Secondary | ICD-10-CM | POA: Diagnosis present

## 2018-10-07 DIAGNOSIS — O99282 Endocrine, nutritional and metabolic diseases complicating pregnancy, second trimester: Secondary | ICD-10-CM | POA: Diagnosis present

## 2018-10-07 DIAGNOSIS — Z3A21 21 weeks gestation of pregnancy: Secondary | ICD-10-CM

## 2018-10-07 DIAGNOSIS — Z841 Family history of disorders of kidney and ureter: Secondary | ICD-10-CM

## 2018-10-07 DIAGNOSIS — O0990 Supervision of high risk pregnancy, unspecified, unspecified trimester: Secondary | ICD-10-CM | POA: Diagnosis not present

## 2018-10-07 DIAGNOSIS — Z8249 Family history of ischemic heart disease and other diseases of the circulatory system: Secondary | ICD-10-CM | POA: Diagnosis not present

## 2018-10-07 DIAGNOSIS — Z8261 Family history of arthritis: Secondary | ICD-10-CM | POA: Diagnosis not present

## 2018-10-07 DIAGNOSIS — G894 Chronic pain syndrome: Secondary | ICD-10-CM | POA: Diagnosis present

## 2018-10-07 DIAGNOSIS — D5700 Hb-SS disease with crisis, unspecified: Secondary | ICD-10-CM | POA: Diagnosis not present

## 2018-10-07 LAB — CBC WITH DIFFERENTIAL/PLATELET
Abs Immature Granulocytes: 0.12 10*3/uL — ABNORMAL HIGH (ref 0.00–0.07)
Basophils Absolute: 0 10*3/uL (ref 0.0–0.1)
Basophils Relative: 0 %
Eosinophils Absolute: 0.2 10*3/uL (ref 0.0–0.5)
Eosinophils Relative: 2 %
HCT: 24.8 % — ABNORMAL LOW (ref 36.0–46.0)
Hemoglobin: 8.8 g/dL — ABNORMAL LOW (ref 12.0–15.0)
Immature Granulocytes: 1 %
Lymphocytes Relative: 19 %
Lymphs Abs: 2.2 10*3/uL (ref 0.7–4.0)
MCH: 31.3 pg (ref 26.0–34.0)
MCHC: 35.5 g/dL (ref 30.0–36.0)
MCV: 88.3 fL (ref 80.0–100.0)
MONOS PCT: 13 %
Monocytes Absolute: 1.5 10*3/uL — ABNORMAL HIGH (ref 0.1–1.0)
Neutro Abs: 7.8 10*3/uL — ABNORMAL HIGH (ref 1.7–7.7)
Neutrophils Relative %: 65 %
Platelets: 547 10*3/uL — ABNORMAL HIGH (ref 150–400)
RBC: 2.81 MIL/uL — ABNORMAL LOW (ref 3.87–5.11)
RDW: 13.8 % (ref 11.5–15.5)
WBC: 11.8 10*3/uL — ABNORMAL HIGH (ref 4.0–10.5)
nRBC: 0.8 % — ABNORMAL HIGH (ref 0.0–0.2)

## 2018-10-07 LAB — URINALYSIS, ROUTINE W REFLEX MICROSCOPIC
Bilirubin Urine: NEGATIVE
Glucose, UA: NEGATIVE mg/dL
Hgb urine dipstick: NEGATIVE
Ketones, ur: 80 mg/dL — AB
Nitrite: NEGATIVE
PH: 6 (ref 5.0–8.0)
Protein, ur: NEGATIVE mg/dL
Specific Gravity, Urine: 1.013 (ref 1.005–1.030)

## 2018-10-07 LAB — COMPREHENSIVE METABOLIC PANEL
ALT: 28 U/L (ref 0–44)
AST: 33 U/L (ref 15–41)
Albumin: 3.4 g/dL — ABNORMAL LOW (ref 3.5–5.0)
Alkaline Phosphatase: 68 U/L (ref 38–126)
Anion gap: 10 (ref 5–15)
BUN: 8 mg/dL (ref 6–20)
CO2: 21 mmol/L — ABNORMAL LOW (ref 22–32)
Calcium: 9.2 mg/dL (ref 8.9–10.3)
Chloride: 105 mmol/L (ref 98–111)
Creatinine, Ser: 0.48 mg/dL (ref 0.44–1.00)
GFR calc non Af Amer: >60 mL/min (ref >60)
Glucose, Bld: 83 mg/dL (ref 70–99)
Potassium: 3.7 mmol/L (ref 3.5–5.1)
Sodium: 136 mmol/L (ref 135–145)
Total Bilirubin: 1.5 mg/dL — ABNORMAL HIGH (ref 0.3–1.2)
Total Protein: 7.6 g/dL (ref 6.5–8.1)

## 2018-10-07 LAB — RETICULOCYTES
IMMATURE RETIC FRACT: 39.2 % — AB (ref 2.3–15.9)
RBC.: 2.81 MIL/uL — ABNORMAL LOW (ref 3.87–5.11)
RETIC COUNT ABSOLUTE: 206.5 10*3/uL — AB (ref 19.0–186.0)
Retic Ct Pct: 7.4 % — ABNORMAL HIGH (ref 0.4–3.1)

## 2018-10-07 MED ORDER — DIPHENHYDRAMINE HCL 50 MG/ML IJ SOLN: 12.5000 mg | Freq: Four times a day (QID) | INTRAMUSCULAR | Status: DC | PRN

## 2018-10-07 MED ORDER — OXYCODONE HCL ER 10 MG PO T12A
30.0000 mg | EXTENDED_RELEASE_TABLET | Freq: Once | ORAL | Status: AC
  Administered 2018-10-07: 30 mg via ORAL
  Filled 2018-10-07: qty 3

## 2018-10-07 MED ORDER — ACETAMINOPHEN 500 MG PO TABS
1000.0000 mg | ORAL_TABLET | Freq: Once | ORAL | Status: AC
  Administered 2018-10-07: 1000 mg via ORAL
  Filled 2018-10-07: qty 2

## 2018-10-07 MED ORDER — SENNOSIDES-DOCUSATE SODIUM 8.6-50 MG PO TABS
1.0000 | ORAL_TABLET | Freq: Two times a day (BID) | ORAL | Status: DC
  Administered 2018-10-07 – 2018-10-13 (×12): 1 via ORAL
  Filled 2018-10-07 (×12): qty 1

## 2018-10-07 MED ORDER — HYDROMORPHONE HCL 2 MG/ML IJ SOLN
2.0000 mg | Freq: Once | INTRAMUSCULAR | Status: AC
  Administered 2018-10-07: 2 mg via INTRAVENOUS
  Filled 2018-10-07: qty 1

## 2018-10-07 MED ORDER — DEXTROSE-NACL 5-0.45 % IV SOLN
INTRAVENOUS | Status: DC
  Administered 2018-10-07 – 2018-10-10 (×2): via INTRAVENOUS

## 2018-10-07 MED ORDER — ONDANSETRON HCL 4 MG/2ML IJ SOLN
4.0000 mg | INTRAMUSCULAR | Status: DC | PRN
  Administered 2018-10-07 – 2018-10-13 (×7): 4 mg via INTRAVENOUS
  Filled 2018-10-07 (×7): qty 2

## 2018-10-07 MED ORDER — HYDROMORPHONE HCL 1 MG/ML IJ SOLN
1.0000 mg | Freq: Once | INTRAMUSCULAR | Status: AC
  Administered 2018-10-07: 1 mg via INTRAVENOUS
  Filled 2018-10-07: qty 1

## 2018-10-07 MED ORDER — FENTANYL CITRATE (PF) 100 MCG/2ML IJ SOLN
50.0000 ug | Freq: Once | INTRAMUSCULAR | Status: AC
  Administered 2018-10-07: 50 ug via INTRAVENOUS
  Filled 2018-10-07: qty 2

## 2018-10-07 MED ORDER — ONDANSETRON HCL 4 MG/2ML IJ SOLN: 4.0000 mg | Freq: Four times a day (QID) | INTRAMUSCULAR | Status: DC | PRN

## 2018-10-07 MED ORDER — NALOXONE HCL 0.4 MG/ML IJ SOLN: 0.4000 mg | INTRAMUSCULAR | Status: DC | PRN

## 2018-10-07 MED ORDER — VITAMIN D (ERGOCALCIFEROL) 1.25 MG (50000 UNIT) PO CAPS
50000.0000 [IU] | ORAL_CAPSULE | ORAL | Status: DC
  Administered 2018-10-09: 50000 [IU] via ORAL
  Filled 2018-10-07: qty 1

## 2018-10-07 MED ORDER — FOLIC ACID 1 MG PO TABS
1.0000 mg | ORAL_TABLET | Freq: Every day | ORAL | Status: DC
  Administered 2018-10-07 – 2018-10-13 (×7): 1 mg via ORAL
  Filled 2018-10-07 (×7): qty 1

## 2018-10-07 MED ORDER — ENOXAPARIN SODIUM 40 MG/0.4ML ~~LOC~~ SOLN
40.0000 mg | SUBCUTANEOUS | Status: DC
  Filled 2018-10-07 (×3): qty 0.4

## 2018-10-07 MED ORDER — SODIUM CHLORIDE 0.9% FLUSH: 9.0000 mL | INTRAVENOUS | Status: DC | PRN

## 2018-10-07 MED ORDER — DEXTROSE-NACL 5-0.45 % IV SOLN
INTRAVENOUS | Status: DC
  Administered 2018-10-07 – 2018-10-09 (×4): via INTRAVENOUS

## 2018-10-07 MED ORDER — POLYETHYLENE GLYCOL 3350 17 G PO PACK: 17.0000 g | PACK | Freq: Every day | ORAL | Status: DC | PRN

## 2018-10-07 MED ORDER — PRENATAL MULTIVITAMIN CH
1.0000 | ORAL_TABLET | Freq: Every day | ORAL | Status: DC
  Administered 2018-10-08 – 2018-10-13 (×6): 1 via ORAL
  Filled 2018-10-07 (×7): qty 1

## 2018-10-07 MED ORDER — HYDROMORPHONE 1 MG/ML IV SOLN
INTRAVENOUS | Status: DC
  Administered 2018-10-07: 5 mg via INTRAVENOUS
  Administered 2018-10-07: 13:00:00 via INTRAVENOUS
  Administered 2018-10-07: 4.5 mg via INTRAVENOUS
  Administered 2018-10-08: 4 mg via INTRAVENOUS
  Administered 2018-10-08: 6.5 mL via INTRAVENOUS
  Administered 2018-10-08: 1.5 mg via INTRAVENOUS
  Administered 2018-10-08 – 2018-10-09 (×2): via INTRAVENOUS
  Administered 2018-10-09: 10.5 mg via INTRAVENOUS
  Administered 2018-10-09: 3 mg via INTRAVENOUS
  Administered 2018-10-09: 4.5 mg via INTRAVENOUS
  Administered 2018-10-10: 8 mg via INTRAVENOUS
  Administered 2018-10-10: 5 mg via INTRAVENOUS
  Administered 2018-10-10: 4.5 mg via INTRAVENOUS
  Administered 2018-10-10: 5.5 mg via INTRAVENOUS
  Administered 2018-10-10: 07:00:00 via INTRAVENOUS
  Administered 2018-10-10: 7.5 mg via INTRAVENOUS
  Administered 2018-10-11: 0.5 mg via INTRAVENOUS
  Administered 2018-10-11: 04:00:00 via INTRAVENOUS
  Administered 2018-10-11: 3 mg via INTRAVENOUS
  Administered 2018-10-11: 3.5 mg via INTRAVENOUS
  Administered 2018-10-11: 5.5 mg via INTRAVENOUS
  Administered 2018-10-11: 7 mg via INTRAVENOUS
  Administered 2018-10-11 – 2018-10-12 (×2): 3.5 mg via INTRAVENOUS
  Administered 2018-10-12: 04:00:00 via INTRAVENOUS
  Administered 2018-10-12: 6 mg via INTRAVENOUS
  Administered 2018-10-12 (×2): 4.5 mg via INTRAVENOUS
  Filled 2018-10-07 (×5): qty 25

## 2018-10-07 MED ORDER — DIPHENHYDRAMINE HCL 12.5 MG/5ML PO ELIX
12.5000 mg | ORAL_SOLUTION | Freq: Four times a day (QID) | ORAL | Status: DC | PRN
  Administered 2018-10-07 – 2018-10-11 (×2): 12.5 mg via ORAL
  Filled 2018-10-07 (×2): qty 5

## 2018-10-07 MED ORDER — SODIUM CHLORIDE 0.9 % IV BOLUS
1000.0000 mL | Freq: Once | INTRAVENOUS | Status: AC
  Administered 2018-10-07: 1000 mL via INTRAVENOUS

## 2018-10-07 NOTE — ED Triage Notes (Signed)
VS 148/86 HR 90 O2 sat 100% RA [id number]

## 2018-10-07 NOTE — ED Provider Notes (Signed)
Complains of bilateral arm pain bilateral leg pain, back pain awaken her from sleep 1 AM today typical of sickle cell crisis she is had in the past.  She denies fever denies chest pain denies shortness of breath denies abdominal pain..  Pain is not improved after 3 doses of opioid pain medicine here as well as Tylenol and bolused with intravenous normal saline solution On exam alert Glasgow Coma Score 15 no distress lungs clear to auscultation heart regular rate and rhythm abdomen soft nontender all 4 extremities without tenderness, neurovascularly intact Results for orders placed or performed during the hospital encounter of 10/07/18  Comprehensive metabolic panel  Result Value Ref Range   Sodium 136 135 - 145 mmol/L   Potassium 3.7 3.5 - 5.1 mmol/L   Chloride 105 98 - 111 mmol/L   CO2 21 (L) 22 - 32 mmol/L   Glucose, Bld 83 70 - 99 mg/dL   BUN 8 6 - 20 mg/dL   Creatinine, Ser 0.48 0.44 - 1.00 mg/dL   Calcium 9.2 8.9 - 10.3 mg/dL   Total Protein 7.6 6.5 - 8.1 g/dL   Albumin 3.4 (L) 3.5 - 5.0 g/dL   AST 33 15 - 41 U/L   ALT 28 0 - 44 U/L   Alkaline Phosphatase 68 38 - 126 U/L   Total Bilirubin 1.5 (H) 0.3 - 1.2 mg/dL   GFR calc non Af Amer >60 >60 mL/min   GFR calc Af Amer >60 >60 mL/min   Anion gap 10 5 - 15  CBC with Differential  Result Value Ref Range   WBC 11.8 (H) 4.0 - 10.5 K/uL   RBC 2.81 (L) 3.87 - 5.11 MIL/uL   Hemoglobin 8.8 (L) 12.0 - 15.0 g/dL   HCT 24.8 (L) 36.0 - 46.0 %   MCV 88.3 80.0 - 100.0 fL   MCH 31.3 26.0 - 34.0 pg   MCHC 35.5 30.0 - 36.0 g/dL   RDW 13.8 11.5 - 15.5 %   Platelets 547 (H) 150 - 400 K/uL   nRBC 0.8 (H) 0.0 - 0.2 %   Neutrophils Relative % 65 %   Neutro Abs 7.8 (H) 1.7 - 7.7 K/uL   Lymphocytes Relative 19 %   Lymphs Abs 2.2 0.7 - 4.0 K/uL   Monocytes Relative 13 %   Monocytes Absolute 1.5 (H) 0.1 - 1.0 K/uL   Eosinophils Relative 2 %   Eosinophils Absolute 0.2 0.0 - 0.5 K/uL   Basophils Relative 0 %   Basophils Absolute 0.0 0.0 - 0.1 K/uL    Immature Granulocytes 1 %   Abs Immature Granulocytes 0.12 (H) 0.00 - 0.07 K/uL  Reticulocytes  Result Value Ref Range   Retic Ct Pct 7.4 (H) 0.4 - 3.1 %   RBC. 2.81 (L) 3.87 - 5.11 MIL/uL   Retic Count, Absolute 206.5 (H) 19.0 - 186.0 K/uL   Immature Retic Fract 39.2 (H) 2.3 - 15.9 %  Urinalysis, Routine w reflex microscopic  Result Value Ref Range   Color, Urine AMBER (A) YELLOW   APPearance HAZY (A) CLEAR   Specific Gravity, Urine 1.013 1.005 - 1.030   pH 6.0 5.0 - 8.0   Glucose, UA NEGATIVE NEGATIVE mg/dL   Hgb urine dipstick NEGATIVE NEGATIVE   Bilirubin Urine NEGATIVE NEGATIVE   Ketones, ur 80 (A) NEGATIVE mg/dL   Protein, ur NEGATIVE NEGATIVE mg/dL   Nitrite NEGATIVE NEGATIVE   Leukocytes, UA TRACE (A) NEGATIVE   RBC / HPF 0-5 0 - 5 RBC/hpf  WBC, UA 6-10 0 - 5 WBC/hpf   Bacteria, UA RARE (A) NONE SEEN   Squamous Epithelial / LPF 11-20 0 - 5   Mucus PRESENT    [place] Mfm Ob Follow Up  Result Date: 10/04/2018 ----------------------------------------------------------------------  OBSTETRICS REPORT                       (Signed Final 10/04/2018 05:34 pm) ---------------------------------------------------------------------- Patient Info  ID #:       [id number]                          D.O.B.:  1991-10-19 (27 yrs)  Name:       Carmen [name]                  Visit Date: 10/04/2018 03:48 pm ---------------------------------------------------------------------- Performed By  Performed By:     Enriqueta Shutter           Ref. Address:     [place]                                                             [facility]                                                             [address]                                                             [place], [place]  Attending:        Lajoyce Lauber      Location:          [facility]                    MD  Referred By:      [facility] ---------------------------------------------------------------------- Orders   #  Description                          Code         Ordered By   1  [place] MFM OB FOLLOW UP                  [id number]     Arlina Robes  ----------------------------------------------------------------------   #  Order #                    Accession #                 Episode #   1  [id number]                  [id number]                  [id number]  ---------------------------------------------------------------------- Indications   [redacted] weeks gestation of pregnancy                Z3A.20   Encounter for antenatal screening for          Z36.3   malformations   Maternal sickle cell anemia, second trimester  O99.012, J18.8   Drug use complicating pregnancy, second        O99.322   trimester (chronic opiate use)  ---------------------------------------------------------------------- Fetal Evaluation  Num Of Fetuses:         1  Fetal Heart Rate(bpm):  159  Cardiac Activity:       Observed  Presentation:           Breech  Placenta:               Posterior  P. Cord Insertion:      Visualized  Amniotic Fluid  AFI FV:      Within normal limits subjectively                              Largest Pocket(cm)                              5.02 ---------------------------------------------------------------------- Biometry  BPD:      42.7  mm     G. Age:  18w 6d          6  %    CI:        63.45   %    70 - 86                                                          FL/HC:      17.8   %    16.8 - 19.8  HC:      172.7  mm     G. Age:  19w 6d         23  %    HC/AC:      1.13        1.09 - 1.39  AC:      152.2  mm  G. Age:  20w 3d         77  %    FL/BPD:     72.1   %  FL:       30.8  mm     G. Age:  19w 4d         19  %    FL/AC:      20.2   %    20 - 24  HUM:      30.6  mm     G. Age:  20w 1d          47  %  CER:      19.4  mm     G. Age:  18w 5d          8  %  NFT:         4  mm  LV:        6.4  mm  CM:        3.9  mm  Est. FW:     324  gm    0 lb 11 oz      41  % ---------------------------------------------------------------------- OB History  Gravidity:    2  TOP:          1 ---------------------------------------------------------------------- Gestational Age  LMP:           20w 2d        Date:  05/15/18                 EDD:   02/19/19  U/S Today:     19w 5d                                        EDD:   02/23/19  Best:          Hyacinth Meeker 2d     Det. By:  LMP  (05/15/18)          EDD:   02/19/19 ---------------------------------------------------------------------- Anatomy  Cranium:               Appears normal         LVOT:                   Not well visualized  Cavum:                 Appears normal         Aortic Arch:            Not well visualized  Ventricles:            Appears normal         Ductal Arch:            Not well visualized  Choroid Plexus:        Appears normal         Diaphragm:              Appears normal  Cerebellum:            Appears normal         Stomach:                Appears normal, left  sided  Posterior Fossa:       Appears normal         Abdomen:                Appears normal  Nuchal Fold:           Appears normal         Abdominal Wall:         Appears nml (cord                                                                        insert, abd wall)  Face:                  Appears normal         Cord Vessels:           Appears normal (3                         (orbits and profile)                           vessel cord)  Lips:                  Appears normal         Kidneys:                Appear normal  Palate:                Appears normal         Bladder:                Appears normal  Thoracic:              Appears normal         Spine:                  Appears normal  Heart:                 Appears normal          Upper Extremities:      Appears normal                         (4CH, axis, and situs  RVOT:                  Not well visualized    Lower Extremities:      Appears normal  Other:  Fetus appears to be a female. Heels and 5th digit visualized. Open          hands visualized. Nasal bone visualized. ---------------------------------------------------------------------- Cervix Uterus Adnexa  Cervix  Length:           4.08  cm.  Normal appearance by transabdominal scan. ---------------------------------------------------------------------- Comments  U/S images reviewed. Findings reviewed.   Appropriate fetal  growth is noted.   No fetal abnormalities are seen.  Recommendations: 1) Serial U/S every 4 weeks for fetal  growth  2) Close A-P surveillance in the third trimester ----------------------------------------------------------------------               Herbie Baltimore  Duffy Rhody, MD Electronically Signed Final Report   10/04/2018 05:34 pm ---------------------------------------------------------------------- Dr. Jonelle Sidle consulted from sickle cell service.  Will arrange for overnight stay   Orlie Dakin, MD 10/07/18 [id number]

## 2018-10-07 NOTE — ED Provider Notes (Addendum)
[facility] DEPT Provider Note  CSN: [id number] Arrival date & time: 10/07/18 4696  Chief Complaint(s) Sickle Cell Pain Crisis  HPI Carmen [name] is a 27 y.o. 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  She reports that she woke up several hours ago with a pain.  She denies any chest pain or shortness of breath.  No abdominal pain or cramping.  No urinary symptoms.  States that she has not taken any additional pain medicine other than her usual 30 mg of oxycodone which she took at 8 PM last night.  She did report having an episode of emesis after eating acidic foods.  She reports that she has been having emesis since her pregnancy which has improved significantly.  She has been able to keep some fluids down.   HPI  Past Medical History Past Medical History:  Diagnosis Date  . Abortion    05/2012  . Headache(784.0)   . Sickle cell crisis [facility])    Patient Active Problem List   Diagnosis Date Noted  . Supervision of high risk pregnancy, antepartum 08/17/2018  . Chronic prescription opiate use 03/13/2018  . Chronic musculoskeletal pain 03/13/2018  . Vitamin D deficiency 01/13/2018  . 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[place]) 04/05/2017  . Hb-SS disease without crisis ([place]) 08/15/2016  . Anemia of chronic disease   . Sickle cell crisis ([employer]) 02/28/2015  . S/P laparoscopic cholecystectomy 11/30/2014  . Thrombocytosis ([place]) 11/22/2014  . Systolic ejection murmur 29/52/8413   Home Medication(s) Prior to Admission medications   Medication Sig Start Date End Date Taking? Authorizing Provider  aspirin EC 81 MG tablet Take 1 tablet (81 mg total) by mouth daily. 08/17/18   [name], MD  Doxylamine-Pyridoxine 10-10 MG TBEC Take 2 tablets by mouth at bedtime. 2 tabs po QHS x 2 nights. If symptoms persist then  1 tab po Q AM and 2 tabs QHS. Give on empty stomach Patient not taking: Reported on 08/17/2018 07/20/18   Lanae Boast, FNP  ergocalciferol (VITAMIN D2) 50000 units capsule Take 1 capsule (50,000 Units total) by mouth once a week. 05/22/18   Tresa Garter, MD  folic acid (FOLVITE) 1 MG tablet Take 1 tablet (1 mg total) by mouth daily. 08/17/18   [name], MD  Nutritional Supplements (ENSURE HEALTHY MOM) LIQD Consume 2 bottles daily Patient not taking: Reported on 09/21/2018 09/14/18   Constant, Peggy, MD  oxyCODONE (OXYCONTIN) 30 MG 12 hr tablet Take 1 tablet (30 mg total) by mouth every 12 (twelve) hours. Patient taking differently: Take 1 tablet by mouth 2 (two) times daily as needed (pain).  09/15/18 10/15/18  Lanae Boast, FNP  Prenatal Vit-Fe Fumarate-FA (PREPLUS) 27-1 MG TABS Take 1 tablet by mouth daily. 08/17/18   [name], MD  Past Surgical History Past Surgical History:  Procedure Laterality Date  . CHOLECYSTECTOMY N/A 11/30/2014   Procedure: LAPAROSCOPIC CHOLECYSTECTOMY SINGLE SITE WITH INTRAOPERATIVE CHOLANGIOGRAM;  Surgeon: Michael Boston, MD;  Location: [facility];  Service: General;  Laterality: N/A;  . SPLENECTOMY     Family History Family History  Problem Relation Age of Onset  . Hypertension Mother   . Sickle cell anemia Sister   . Kidney disease Sister        Lupus  . Arthritis Sister   . Sickle cell anemia Sister   . Sickle cell trait Sister   . Heart disease Maternal Aunt        CABG  . Heart disease Maternal Uncle        CABG  . Lupus Sister     Social History Social History   Tobacco Use  . Smoking status: Never Smoker  . Smokeless tobacco: Never Used  Substance Use Topics  . Alcohol use: No  . Drug use: No   Allergies Patient has no known allergies.  Review of Systems Review of Systems All other  systems are reviewed and are negative for acute change except as noted in the HPI  Physical Exam Vital Signs  I have reviewed the triage vital signs BP (!) 106/54   Pulse 92   Resp 16   LMP 05/15/2018 (Approximate)   SpO2 99% FHT 143  Physical Exam  Constitutional: She is oriented to person, place, and time. She appears well-developed and well-nourished. No distress.  HENT:  Head: Normocephalic and atraumatic.  Right Ear: External ear normal.  Left Ear: External ear normal.  Nose: Nose normal.  Eyes: Conjunctivae and EOM are normal. No scleral icterus.  Neck: Normal range of motion and phonation normal.  Cardiovascular: Normal rate and regular rhythm.  Pulmonary/Chest: Effort normal. No stridor. No respiratory distress.  Abdominal: She exhibits no distension.  Musculoskeletal: Normal range of motion. She exhibits no edema.  Neurological: She is alert and oriented to person, place, and time.  Skin: She is not diaphoretic.  Psychiatric: She has a normal mood and affect. Her behavior is normal.  Vitals reviewed.   ED Results and Treatments Labs (all labs ordered are listed, but only abnormal results are displayed) Labs Reviewed  COMPREHENSIVE METABOLIC PANEL - Abnormal; Notable for the following components:      Result Value   CO2 21 (*)    Albumin 3.4 (*)    Total Bilirubin 1.5 (*)    All other components within normal limits  CBC WITH DIFFERENTIAL/PLATELET - Abnormal; Notable for the following components:   WBC 11.8 (*)    RBC 2.81 (*)    Hemoglobin 8.8 (*)    HCT 24.8 (*)    Platelets 547 (*)    nRBC 0.8 (*)    Neutro Abs 7.8 (*)    Monocytes Absolute 1.5 (*)    Abs Immature Granulocytes 0.12 (*)    All other components within normal limits  RETICULOCYTES - Abnormal; Notable for the following components:   Retic Ct Pct 7.4 (*)    RBC. 2.81 (*)    Retic Count, Absolute 206.5 (*)    Immature Retic Fract 39.2 (*)    All other components within normal limits    URINALYSIS, ROUTINE W REFLEX MICROSCOPIC - Abnormal; Notable for the following components:   Color, Urine AMBER (*)    APPearance HAZY (*)    Ketones, ur 80 (*)    Leukocytes, UA TRACE (*)  Bacteria, UA RARE (*)    All other components within normal limits  URINE CULTURE                                                                                                                         EKG  EKG Interpretation  Date/Time:    Ventricular Rate:    PR Interval:    QRS Duration:   QT Interval:    QTC Calculation:   R Axis:     Text Interpretation:        Radiology No results found. Pertinent labs & imaging results that were available during my care of the patient were reviewed by me and considered in my medical decision making (see chart for details).  Medications Ordered in ED Medications  dextrose 5 %-0.45 % sodium chloride infusion ( Intravenous New Bag/Given 10/07/18 0549)  ondansetron (ZOFRAN) injection 4 mg (4 mg Intravenous Given 10/07/18 0550)  acetaminophen (TYLENOL) tablet 1,000 mg (has no administration in time range)  HYDROmorphone (DILAUDID) injection 2 mg (has no administration in time range)  sodium chloride 0.9 % bolus 1,000 mL (1,000 mLs Intravenous New Bag/Given 10/07/18 0606)  fentaNYL (SUBLIMAZE) injection 50 mcg (50 mcg Intravenous Given 10/07/18 0553)  oxyCODONE (OXYCONTIN) 12 hr tablet 30 mg (30 mg Oral Given 10/07/18 0552)  HYDROmorphone (DILAUDID) injection 1 mg (1 mg Intravenous Given 10/07/18 5993)                                                                                                                                    Procedures Procedures  (including critical care time)  Medical Decision Making / ED Course I have reviewed the nursing notes for this encounter and the patient's prior records (if available in EHR or on provided paperwork).     Patient presents with typical sickle cell pain.  Labs close to patient's baseline with stable  hemoglobin and reticulocyte counts.  No significant electrolyte derangements or renal insufficiency.  No evidence of hemolytic or aplastic crisis.  Patient provided with IV fluids, IV pain meds and also oral meds.  Will continue to reassess.  On reassessment patient reports that her pain is still severe and states that is spreading to her arms.  Given additional IV pain meds.  Plan for reassessment and one last pain dose. If stable with pain control, she would be appropriate for DC.  Patient care turned over  to Dr Winfred Leeds at 0730. Patient case and results discussed in detail; please see their note for further ED managment.     Final Clinical Impression(s) / ED Diagnoses Final diagnoses:  Sickle-cell disease with pain ([place])      This chart was dictated using voice recognition software.  Despite best efforts to proofread,  errors can occur which can change the documentation meaning.     Fatima Blank, MD 10/07/18 [phone]

## 2018-10-07 NOTE — ED Notes (Signed)
ED TO INPATIENT HANDOFF REPORT  Name/Age/Gender Carmen [name] 27 y.o. female  Code Status    Code Status Orders  (From admission, onward)         Start     Ordered   10/07/18 1522  Full code  Continuous     10/07/18 1522        Code Status History    Date Active Date Inactive Code Status Order ID Comments User Context   08/25/2017 0200 08/30/2017 1810 Full Code [id number]  Norval Morton, MD ED   08/21/2017 0232 08/23/2017 1549 Full Code [id number]  Ivor Costa, MD ED   07/17/2017 1314 07/20/2017 1853 Full Code [id number]  Elwyn Reach, MD Inpatient   06/27/2017 0610 06/28/2017 1931 Full Code [id number]  Rise Patience, MD ED   06/25/2017 1408 06/25/2017 2232 Full Code [id number]  Leana Gamer, MD Inpatient   06/21/2017 0019 06/24/2017 2204 Full Code [id number]  Etta Quill, DO ED   05/29/2017 0147 05/31/2017 1353 Full Code [id number]  Etta Quill, DO ED   05/23/2017 0336 05/24/2017 1817 Full Code [id number]  Vianne Bulls, MD ED   05/18/2017 0211 05/22/2017 1416 Full Code [id number]  Reubin Milan, MD Inpatient   05/18/2017 0211 05/18/2017 0211 Full Code [id number]  Reubin Milan, MD Inpatient   03/23/2017 0613 03/25/2017 1907 Full Code [id number]  Norval Morton, MD ED   03/19/2017 0456 03/19/2017 1534 Full Code [id number]  Lily Kocher, MD ED   01/19/2017 2128 01/25/2017 2104 Full Code [id number]  Annita Brod, MD Inpatient   12/24/2016 0933 12/28/2016 1853 Full Code [id number]  Tresa Garter, MD Inpatient   11/30/2016 1020 11/30/2016 2123 Full Code [id number]  Tresa Garter, MD Inpatient   08/30/2016 0503 09/05/2016 1511 Full Code [id number]  Norval Morton, MD ED   08/16/2016 0049 08/23/2016 2056 Full Code [id number]  Edwin Dada, MD ED   06/14/2016 1655 06/19/2016 1703 Full Code [id number]  Elwyn Reach, MD Inpatient   05/14/2016 2103 05/22/2016 2028 Full Code [id number]  Louellen Molder, MD Inpatient   04/19/2016 1519 04/26/2016 2049  Full Code [id number]  Leana Gamer, MD Inpatient   02/10/2016 2022 02/15/2016 1859 Full Code [id number]  Vianne Bulls, MD ED   12/19/2015 1324 12/25/2015 2045 Full Code [id number]  Elwyn Reach, MD Inpatient   12/19/2015 0746 12/19/2015 1324 Full Code [id number]  Reyne Dumas, MD ED   11/11/2015 0409 11/17/2015 2103 Full Code [id number]  Toy Baker, MD ED   10/17/2015 1248 10/24/2015 2031 Full Code [id number]  Leana Gamer, MD ED   10/07/2015 1809 10/14/2015 2058 Full Code [id number]  Robbie Lis, MD ED   09/09/2015 0207 09/14/2015 2131 Full Code [id number]  Edwin Dada, MD Inpatient   03/12/2015 0338 03/18/2015 1702 Full Code [id number]  Rise Patience, MD Inpatient   02/28/2015 2122 03/06/2015 1547 Full Code [id number]  Toy Baker, MD Inpatient   02/25/2015 1720 02/27/2015 1336 Full Code [id number]  Bonnielee Haff, MD Inpatient   02/16/2015 0043 02/22/2015 1921 Full Code [id number]  Lavina Hamman, MD ED   11/28/2014 2048 12/02/2014 1435 Full Code [id number]  Orson Eva, MD Inpatient   11/22/2014 2314 11/24/2014 1658 Full Code [id number]  Theressa Millard, MD Inpatient   05/16/2014 0631 05/18/2014 1649 Full Code [id number]  Rise Patience, MD Inpatient   11/12/2013 2227 11/13/2013 1851 Full Code [id number]  Annita Brod, MD Inpatient  Home/SNF/Other Home  Chief Complaint sickle cell  Level of Care/Admitting Diagnosis ED Disposition    ED Disposition Condition [facility] Area: [place] [[id number]]  Level of Care: Med-Surg [16]  Diagnosis: Sickle cell anemia with crisis [facility]) [[id number]]  Admitting Physician: Elwyn Reach [[id number]]  Attending Physician: Elwyn Reach [[id number]]  Estimated length of stay: past midnight tomorrow  Certification:: I certify this patient will need inpatient services for at least 2 midnights  PT Class (Do Not Modify): Inpatient [101]  PT Acc Code (Do Not Modify): Private [1]        Medical History Past Medical History:  Diagnosis Date  . Abortion    05/2012  . Headache(784.0)   . Sickle cell crisis ([place])     Allergies No Known Allergies  IV Location/Drains/Wounds Patient Lines/Drains/Airways Status   Active Line/Drains/Airways    Name:   Placement date:   Placement time:   Site:   Days:   Peripheral IV 10/07/18 Left Hand   10/07/18    0548    Hand   less than 1          Labs/Imaging Results for orders placed or performed during the hospital encounter of 10/07/18 (from the past 48 hour(s))  Comprehensive metabolic panel     Status: Abnormal   Collection Time: 10/07/18  4:58 AM  Result Value Ref Range   Sodium 136 135 - 145 mmol/L   Potassium 3.7 3.5 - 5.1 mmol/L   Chloride 105 98 - 111 mmol/L   CO2 21 (L) 22 - 32 mmol/L   Glucose, Bld 83 70 - 99 mg/dL   BUN 8 6 - 20 mg/dL   Creatinine, Ser 0.48 0.44 - 1.00 mg/dL   Calcium 9.2 8.9 - 10.3 mg/dL   Total Protein 7.6 6.5 - 8.1 g/dL   Albumin 3.4 (L) 3.5 - 5.0 g/dL   AST 33 15 - 41 U/L   ALT 28 0 - 44 U/L   Alkaline Phosphatase 68 38 - 126 U/L   Total Bilirubin 1.5 (H) 0.3 - 1.2 mg/dL   GFR calc non Af Amer >60 >60 mL/min   GFR calc Af Amer >60 >60 mL/min   Anion gap 10 5 - 15    Comment: Performed at [facility], [place] [address] [place], [place] [address]  CBC with Differential     Status: Abnormal   Collection Time: 10/07/18  4:58 AM  Result Value Ref Range   WBC 11.8 (H) 4.0 - 10.5 K/uL   RBC 2.81 (L) 3.87 - 5.11 MIL/uL   Hemoglobin 8.8 (L) 12.0 - 15.0 g/dL   HCT 24.8 (L) 36.0 - 46.0 %   MCV 88.3 80.0 - 100.0 fL   MCH 31.3 26.0 - 34.0 pg   MCHC 35.5 30.0 - 36.0 g/dL   RDW 13.8 11.5 - 15.5 %   Platelets 547 (H) 150 - 400 K/uL   nRBC 0.8 (H) 0.0 - 0.2 %   Neutrophils Relative % 65 %   Neutro Abs 7.8 (H) 1.7 - 7.7 K/uL   Lymphocytes Relative 19 %   Lymphs Abs 2.2 0.7 - 4.0 K/uL   Monocytes Relative 13 %   Monocytes Absolute 1.5 (H) 0.1 - 1.0 K/uL   Eosinophils  Relative 2 %   Eosinophils Absolute 0.2 0.0 - 0.5 K/uL   Basophils Relative 0 %   Basophils Absolute 0.0 0.0 - 0.1 K/uL   Immature Granulocytes 1 %  Abs Immature Granulocytes 0.12 (H) 0.00 - 0.07 K/uL    Comment: Performed at [facility], [place] [address], [place], [place] [address]  Reticulocytes     Status: Abnormal   Collection Time: 10/07/18  4:58 AM  Result Value Ref Range   Retic Ct Pct 7.4 (H) 0.4 - 3.1 %   RBC. 2.81 (L) 3.87 - 5.11 MIL/uL   Retic Count, Absolute 206.5 (H) 19.0 - 186.0 K/uL   Immature Retic Fract 39.2 (H) 2.3 - 15.9 %    Comment: Performed at [facility], [place] [address] [place], [place] [address]  Urinalysis, Routine w reflex microscopic     Status: Abnormal   Collection Time: 10/07/18  6:10 AM  Result Value Ref Range   Color, Urine AMBER (A) YELLOW    Comment: BIOCHEMICALS MAY BE AFFECTED BY COLOR   APPearance HAZY (A) CLEAR   Specific Gravity, Urine 1.013 1.005 - 1.030   pH 6.0 5.0 - 8.0   Glucose, UA NEGATIVE NEGATIVE mg/dL   Hgb urine dipstick NEGATIVE NEGATIVE   Bilirubin Urine NEGATIVE NEGATIVE   Ketones, ur 80 (A) NEGATIVE mg/dL   Protein, ur NEGATIVE NEGATIVE mg/dL   Nitrite NEGATIVE NEGATIVE   Leukocytes, UA TRACE (A) NEGATIVE   RBC / HPF 0-5 0 - 5 RBC/hpf   WBC, UA 6-10 0 - 5 WBC/hpf   Bacteria, UA RARE (A) NONE SEEN   Squamous Epithelial / LPF 11-20 0 - 5   Mucus PRESENT     Comment: Performed at [facility], [place] [address], [place], [place] [address]   No results found. None  Pending Labs Unresulted Labs (From admission, onward)    Start     Ordered   10/14/18 0500  Creatinine, serum  (enoxaparin (LOVENOX)    CrCl >/= 30 ml/min)  Weekly,   R    Comments:  while on enoxaparin therapy    10/07/18 1522   10/08/18 0500  Comprehensive metabolic panel  Tomorrow morning,   R     10/07/18 1522   10/08/18 0500  CBC with Differential/Platelet  Tomorrow morning,   R     10/07/18 1522    10/07/18 1522  HIV antibody (Routine Testing)  Once,   R     10/07/18 1522   10/07/18 1522  CBC  (enoxaparin (LOVENOX)    CrCl >/= 30 ml/min)  Once,   R    Comments:  Baseline for enoxaparin therapy IF NOT ALREADY DRAWN.  Notify MD if PLT < 100 K.    10/07/18 1522   10/07/18 1522  Creatinine, serum  (enoxaparin (LOVENOX)    CrCl >/= 30 ml/min)  Once,   R    Comments:  Baseline for enoxaparin therapy IF NOT ALREADY DRAWN.    10/07/18 1522   10/07/18 0703  Urine culture  ONCE - STAT,   STAT     10/07/18 0702          Vitals/Pain Today's Vitals   10/07/18 1529 10/07/18 1615 10/07/18 1630 10/07/18 1727  BP:  113/63 109/70   Pulse:  72 66   Resp:  16 13   Temp:      TempSrc:      SpO2:  100% 100% 100%  Weight:      Height:      PainSc: 7    7     Isolation Precautions No active isolations  Medications Medications  dextrose 5 %-0.45 % sodium chloride infusion (  Intravenous Rate/Dose Verify 10/07/18 1500)  ondansetron (ZOFRAN) injection 4 mg (4 mg Intravenous Given 10/07/18 1348)  ergocalciferol (VITAMIN D2) capsule 50,000 Units (has no administration in time range)  folic acid (FOLVITE) tablet 1 mg (has no administration in time range)  PREPLUS 27-1 MG TABS 1 tablet (has no administration in time range)  senna-docusate (Senokot-S) tablet 1 tablet (has no administration in time range)  polyethylene glycol (MIRALAX / GLYCOLAX) packet 17 g (has no administration in time range)  naloxone (NARCAN) injection 0.4 mg (has no administration in time range)    And  sodium chloride flush (NS) 0.9 % injection 9 mL (has no administration in time range)  ondansetron (ZOFRAN) injection 4 mg (has no administration in time range)  diphenhydrAMINE (BENADRYL) injection 12.5 mg ( Intravenous See Alternative 10/07/18 1530)    Or  diphenhydrAMINE (BENADRYL) 12.5 MG/5ML elixir 12.5 mg (12.5 mg Oral Given 10/07/18 1530)  enoxaparin (LOVENOX) injection 40 mg (has no administration in time range)   HYDROmorphone (DILAUDID) 1 mg/mL PCA injection (4.5 mg Intravenous Received 10/07/18 1727)  dextrose 5 %-0.45 % sodium chloride infusion (has no administration in time range)  sodium chloride 0.9 % bolus 1,000 mL (0 mLs Intravenous Stopped 10/07/18 1110)  fentaNYL (SUBLIMAZE) injection 50 mcg (50 mcg Intravenous Given 10/07/18 0553)  oxyCODONE (OXYCONTIN) 12 hr tablet 30 mg (30 mg Oral Given 10/07/18 0552)  HYDROmorphone (DILAUDID) injection 1 mg (1 mg Intravenous Given 10/07/18 0637)  acetaminophen (TYLENOL) tablet 1,000 mg (1,000 mg Oral Given 10/07/18 0754)  HYDROmorphone (DILAUDID) injection 2 mg (2 mg Intravenous Given 10/07/18 0754)  HYDROmorphone (DILAUDID) injection 2 mg (2 mg Intravenous Given 10/07/18 1024)    Mobility walks with person assist

## 2018-10-07 NOTE — ED Notes (Signed)
Urine and culture sent to lab  

## 2018-10-07 NOTE — ED Triage Notes (Signed)
Patient arrives by [facility] from home with complaints of sickle cell pain. Pain in neck,back and legs. Patient is [redacted] weeks pregnant. Patient denies any abdominal pain or discharge. Denies SOB, chest pain.

## 2018-10-07 NOTE — H&P (Signed)
Carmen [name] is an 27 y.o. female.    Chief Complaint: Pain in back and legs  HPI: This is a 27 year old female who is [redacted] weeks pregnant now with history of sickle cell disease who has been to the ER repeatedly.  She came into the ER today with more pain in her back and legs rated as 10 out of 10.  She was treated with IV Dilaudid in the ER multiple doses but no response.  Patient is now being admitted to the hospital with sickle cell painful crisis.  Denies any nausea vomiting or diarrhea.  Denied any other significant findings.  Past Medical History:  Diagnosis Date  . Abortion    05/2012  . Headache(784.0)   . Sickle cell crisis [facility])     Past Surgical History:  Procedure Laterality Date  . CHOLECYSTECTOMY N/A 11/30/2014   Procedure: LAPAROSCOPIC CHOLECYSTECTOMY SINGLE SITE WITH INTRAOPERATIVE CHOLANGIOGRAM;  Surgeon: Michael Boston, MD;  Location: [facility];  Service: General;  Laterality: N/A;  . SPLENECTOMY      Family History  Problem Relation Age of Onset  . Hypertension Mother   . Sickle cell anemia Sister   . Kidney disease Sister        Lupus  . Arthritis Sister   . Sickle cell anemia Sister   . Sickle cell trait Sister   . Heart disease Maternal Aunt        CABG  . Heart disease Maternal Uncle        CABG  . Lupus Sister    Social History:  reports that she has never smoked. She has never used smokeless tobacco. She reports that she does not drink alcohol or use drugs.  Allergies: No Known Allergies   (Not in a hospital admission)  Results for orders placed or performed during the hospital encounter of 10/07/18 (from the past 48 hour(s))  Comprehensive metabolic panel     Status: Abnormal   Collection Time: 10/07/18  4:58 AM  Result Value Ref Range   Sodium 136 135 - 145 mmol/L   Potassium 3.7 3.5 - 5.1 mmol/L   Chloride 105 98 - 111 mmol/L   CO2 21 (L) 22 - 32 mmol/L   Glucose, Bld 83 70 - 99 mg/dL   BUN 8 6 - 20 mg/dL   Creatinine, Ser 0.48 0.44 - 1.00  mg/dL   Calcium 9.2 8.9 - 10.3 mg/dL   Total Protein 7.6 6.5 - 8.1 g/dL   Albumin 3.4 (L) 3.5 - 5.0 g/dL   AST 33 15 - 41 U/L   ALT 28 0 - 44 U/L   Alkaline Phosphatase 68 38 - 126 U/L   Total Bilirubin 1.5 (H) 0.3 - 1.2 mg/dL   GFR calc non Af Amer >60 >60 mL/min   GFR calc Af Amer >60 >60 mL/min   Anion gap 10 5 - 15    Comment: Performed at [facility], [place] [address] [place], [place] [address]  CBC with Differential     Status: Abnormal   Collection Time: 10/07/18  4:58 AM  Result Value Ref Range   WBC 11.8 (H) 4.0 - 10.5 K/uL   RBC 2.81 (L) 3.87 - 5.11 MIL/uL   Hemoglobin 8.8 (L) 12.0 - 15.0 g/dL   HCT 24.8 (L) 36.0 - 46.0 %   MCV 88.3 80.0 - 100.0 fL   MCH 31.3 26.0 - 34.0 pg   MCHC 35.5 30.0 - 36.0 g/dL   RDW 13.8 11.5 -  15.5 %   Platelets 547 (H) 150 - 400 K/uL   nRBC 0.8 (H) 0.0 - 0.2 %   Neutrophils Relative % 65 %   Neutro Abs 7.8 (H) 1.7 - 7.7 K/uL   Lymphocytes Relative 19 %   Lymphs Abs 2.2 0.7 - 4.0 K/uL   Monocytes Relative 13 %   Monocytes Absolute 1.5 (H) 0.1 - 1.0 K/uL   Eosinophils Relative 2 %   Eosinophils Absolute 0.2 0.0 - 0.5 K/uL   Basophils Relative 0 %   Basophils Absolute 0.0 0.0 - 0.1 K/uL   Immature Granulocytes 1 %   Abs Immature Granulocytes 0.12 (H) 0.00 - 0.07 K/uL    Comment: Performed at [facility], [place] [address], [place], [place] [address]  Reticulocytes     Status: Abnormal   Collection Time: 10/07/18  4:58 AM  Result Value Ref Range   Retic Ct Pct 7.4 (H) 0.4 - 3.1 %   RBC. 2.81 (L) 3.87 - 5.11 MIL/uL   Retic Count, Absolute 206.5 (H) 19.0 - 186.0 K/uL   Immature Retic Fract 39.2 (H) 2.3 - 15.9 %    Comment: Performed at [facility], [place] [address] [place], [place] [address]  Urinalysis, Routine w reflex microscopic     Status: Abnormal   Collection Time: 10/07/18  6:10 AM  Result Value Ref Range   Color, Urine AMBER (A) YELLOW    Comment: BIOCHEMICALS MAY BE  AFFECTED BY COLOR   APPearance HAZY (A) CLEAR   Specific Gravity, Urine 1.013 1.005 - 1.030   pH 6.0 5.0 - 8.0   Glucose, UA NEGATIVE NEGATIVE mg/dL   Hgb urine dipstick NEGATIVE NEGATIVE   Bilirubin Urine NEGATIVE NEGATIVE   Ketones, ur 80 (A) NEGATIVE mg/dL   Protein, ur NEGATIVE NEGATIVE mg/dL   Nitrite NEGATIVE NEGATIVE   Leukocytes, UA TRACE (A) NEGATIVE   RBC / HPF 0-5 0 - 5 RBC/hpf   WBC, UA 6-10 0 - 5 WBC/hpf   Bacteria, UA RARE (A) NONE SEEN   Squamous Epithelial / LPF 11-20 0 - 5   Mucus PRESENT     Comment: Performed at [facility], [place] [address] [place], [place] [address]   No results found.  Review of Systems  Constitutional: Negative.   HENT: Negative.   Eyes: Negative.   Respiratory: Negative.   Cardiovascular: Negative.   Gastrointestinal: Negative.   Genitourinary: Negative.   Musculoskeletal: Positive for back pain, myalgias and neck pain.  Skin: Negative.   Neurological: Negative.   Endo/Heme/Allergies: Negative.   Psychiatric/Behavioral: Negative.     Blood pressure 103/74, pulse 87, temperature 98.5 F (36.9 C), temperature source Oral, resp. rate 16, height 5\' 3"  (1.6 m), weight 74.8 kg, last menstrual period 05/15/2018, SpO2 100 %. Physical Exam  Constitutional: She is oriented to person, place, and time. She appears well-developed and well-nourished.  HENT:  Head: Normocephalic and atraumatic.  Eyes: Pupils are equal, round, and reactive to light. Conjunctivae are normal.  Neck: Normal range of motion. Neck supple.  Cardiovascular: Normal rate, regular rhythm and normal heart sounds.  Respiratory: Effort normal and breath sounds normal.  GI: Soft. Bowel sounds are normal.  Musculoskeletal: Normal range of motion.  Neurological: She is alert and oriented to person, place, and time.  Skin: Skin is warm and dry.  Psychiatric: She has a normal mood and affect.     Assessment/Plan A 27 year old female here with sickle cell  painful crisis.  Patient is [redacted] weeks pregnant.  #1 sickle cell painful crisis: Patient will be admitted.  Will start IV Dilaudid PCA.  No Toradol due to pregnancy.  Continue with long-acting home regimen.  Titrate for comfort.  #2 sickle cell anemia: Monitor H&H closely.  #3 20 weeks pregnancy: Baby has been checked and appears to be in good condition.  We will consult OB/GYN for safety monitoring.  #4 hypokalemia: Potassium has been repleted.    Barbette Merino, MD 10/07/2018, 8:24 AM

## 2018-10-07 NOTE — ED Notes (Signed)
Bed: OJ50 Expected date:  Expected time:  Means of arrival:  Comments: Ems: 20 week preg SSC

## 2018-10-08 LAB — CBC WITH DIFFERENTIAL/PLATELET
Abs Immature Granulocytes: 0.07 10*3/uL (ref 0.00–0.07)
Basophils Absolute: 0 10*3/uL (ref 0.0–0.1)
Basophils Relative: 0 %
Eosinophils Absolute: 0.2 10*3/uL (ref 0.0–0.5)
Eosinophils Relative: 2 %
HCT: 25.3 % — ABNORMAL LOW (ref 36.0–46.0)
Hemoglobin: 8.9 g/dL — ABNORMAL LOW (ref 12.0–15.0)
Immature Granulocytes: 1 %
LYMPHS ABS: 1.9 10*3/uL (ref 0.7–4.0)
Lymphocytes Relative: 24 %
MCH: 30.4 pg (ref 26.0–34.0)
MCHC: 35.2 g/dL (ref 30.0–36.0)
MCV: 86.3 fL (ref 80.0–100.0)
MONO ABS: 1.2 10*3/uL — AB (ref 0.1–1.0)
Monocytes Relative: 15 %
Neutro Abs: 4.5 10*3/uL (ref 1.7–7.7)
Neutrophils Relative %: 58 %
Platelets: 563 10*3/uL — ABNORMAL HIGH (ref 150–400)
RBC: 2.93 MIL/uL — ABNORMAL LOW (ref 3.87–5.11)
RDW: 13.8 % (ref 11.5–15.5)
WBC: 7.9 10*3/uL (ref 4.0–10.5)
nRBC: 1.5 % — ABNORMAL HIGH (ref 0.0–0.2)

## 2018-10-08 LAB — COMPREHENSIVE METABOLIC PANEL
ALT: 25 U/L (ref 0–44)
AST: 30 U/L (ref 15–41)
Albumin: 3.3 g/dL — ABNORMAL LOW (ref 3.5–5.0)
Alkaline Phosphatase: 63 U/L (ref 38–126)
Anion gap: 7 (ref 5–15)
BUN: <5 mg/dL — ABNORMAL LOW (ref 6–20)
CO2: 24 mmol/L (ref 22–32)
CREATININE: 0.35 mg/dL — AB (ref 0.44–1.00)
Calcium: 9.1 mg/dL (ref 8.9–10.3)
Chloride: 105 mmol/L (ref 98–111)
GFR calc Af Amer: >60 mL/min (ref >60)
GFR calc non Af Amer: >60 mL/min (ref >60)
Glucose, Bld: 99 mg/dL (ref 70–99)
Potassium: 3.4 mmol/L — ABNORMAL LOW (ref 3.5–5.1)
Sodium: 136 mmol/L (ref 135–145)
Total Bilirubin: 1.5 mg/dL — ABNORMAL HIGH (ref 0.3–1.2)
Total Protein: 7.4 g/dL (ref 6.5–8.1)

## 2018-10-08 NOTE — Progress Notes (Signed)
Pt was up vomiting and very exhausted. Zofran effective. Refused lab and ask to come back. Lab will be reschedule for 0845. Will continue to monitor.

## 2018-10-08 NOTE — Progress Notes (Signed)
Subjective: Patient doing better. Pain is down to 7/10.  She denies any fever or chills no nausea vomiting or diarrhea.  Patient taking her medications as prescribed.  Baby also appears to be stable.  Objective: Vital signs in last 24 hours: Temp:  [97.6 F (36.4 C)-98.7 F (37.1 C)] 98.5 F (36.9 C) (12/08 1209) Pulse Rate:  [56-119] 63 (12/08 1209) Resp:  [11-22] 12 (12/08 1209) BP: (87-124)/(48-87) 109/64 (12/08 1209) SpO2:  [98 %-100 %] 98 % (12/08 1209) Weight:  [74.8 kg] 74.8 kg (12/07 1835) Weight change:     Intake/Output from previous day: 12/07 0701 - 12/08 0700 In: 2946.6 [I.V.:1946.6; IV Piggyback:1000] Out: -  Intake/Output this shift: No intake/output data recorded.  General appearance: alert, cooperative, appears stated age and no distress Head: Normocephalic, without obvious abnormality, atraumatic Neck: no adenopathy, no carotid bruit, no JVD, supple, symmetrical, trachea midline and thyroid not enlarged, symmetric, no tenderness/mass/nodules Back: symmetric, no curvature. ROM normal. No CVA tenderness. Resp: clear to auscultation bilaterally Cardio: regular rate and rhythm, S1, S2 normal, no murmur, click, rub or gallop GI: soft, non-tender; bowel sounds normal; no masses,  no organomegaly Extremities: extremities normal, atraumatic, no cyanosis or edema Pulses: 2+ and symmetric Skin: Skin color, texture, turgor normal. No rashes or lesions Neurologic: Grossly normal  Lab Results: Recent Labs    10/07/18 0458 10/08/18 0855  WBC 11.8* 7.9  HGB 8.8* 8.9*  HCT 24.8* 25.3*  PLT 547* 563*   BMET Recent Labs    10/07/18 0458 10/08/18 0855  NA 136 136  K 3.7 3.4*  CL 105 105  CO2 21* 24  GLUCOSE 83 99  BUN 8 <5*  CREATININE 0.48 0.35*  CALCIUM 9.2 9.1    Studies/Results: No results found.  Medications: I have reviewed the patient's current medications.  Assessment/Plan: A 27 year old female here with sickle cell painful crisis.  Patient is  [redacted] weeks pregnant.  #1 sickle cell painful crisis: Patient doing better and we continue IV Dilaudid PCA.  No Toradol due to pregnancy.  Continue with long-acting home regimen.  Titrate for comfort.  #2 sickle cell anemia: Monitor H&H closely. Stable.  #3 20 weeks pregnancy: Baby has been checked and appears to be in good condition.  We will consult OB/GYN for safety monitoring.  #4 hypokalemia: Continue to replete.  #5 Leucocytosis: Resolved.   LOS: 1 day   ,LAWAL 10/08/2018, 12:30 PM

## 2018-10-09 DIAGNOSIS — D638 Anemia in other chronic diseases classified elsewhere: Secondary | ICD-10-CM

## 2018-10-09 DIAGNOSIS — E876 Hypokalemia: Secondary | ICD-10-CM

## 2018-10-09 DIAGNOSIS — O0990 Supervision of high risk pregnancy, unspecified, unspecified trimester: Secondary | ICD-10-CM

## 2018-10-09 LAB — HIV ANTIBODY (ROUTINE TESTING W REFLEX): HIV Screen 4th Generation wRfx: NONREACTIVE

## 2018-10-09 MED ORDER — OXYCODONE HCL 5 MG PO TABS
5.0000 mg | ORAL_TABLET | ORAL | Status: DC | PRN
  Administered 2018-10-09 – 2018-10-12 (×8): 5 mg via ORAL
  Filled 2018-10-09 (×8): qty 1

## 2018-10-09 MED ORDER — ENSURE ENLIVE PO LIQD
237.0000 mL | Freq: Two times a day (BID) | ORAL | Status: DC
  Administered 2018-10-09 – 2018-10-13 (×8): 237 mL via ORAL

## 2018-10-09 NOTE — Progress Notes (Signed)
Subjective: Carmen [name], a 27 year old female with a medical history significant of sickle cell anemia and chronic pain syndrome. Patient is currently in 2nd trimester of pregnancy, she is 20 weeks and 7 days. Patient was followed by obstetrician prior to admission.  Pain intensity 9/10 this am primarily to lower back and lower extremities. Patient endorses nausea and some vomiting that is slightly improved with Zofran. She says that she has had a significant amount of pain throughout pregnancy and multiple ER visits.   She denies headache, chest pain, dizziness, shortness of breath, constipation or diarrhea.   Objective:  Vital signs in last 24 hours:  Vitals:   10/09/18 0050 10/09/18 0437 10/09/18 0442 10/09/18 0812  BP:  (!) 116/53    Pulse:  92    Resp: 12 15 14 15   Temp:  98.3 F (36.8 C)    TempSrc:  Oral    SpO2: 100% 100% 100% 98%  Weight:      Height:        Intake/Output from previous day:   Intake/Output Summary (Last 24 hours) at 10/09/2018 0926 Last data filed at 10/09/2018 0600 Gross per 24 hour  Intake 1771.13 ml  Output -  Net 1771.13 ml    Physical Exam: General: Alert, awake, oriented x3, in no acute distress.  HEENT: [place]/AT PEERL, EOMI Neck: Trachea midline,  no masses, no thyromegal,y no JVD, no carotid bruit OROPHARYNX:  Moist, No exudate/ erythema/lesions.  Heart: Regular rate and rhythm, without murmurs, rubs, gallops, PMI non-displaced, no heaves or thrills on palpation.  Lungs: Clear to auscultation, no wheezing or rhonchi noted. No increased vocal fremitus resonant to percussion  Abdomen: Soft, nontender, nondistended, positive bowel sounds, no masses no hepatosplenomegaly noted..  Neuro: No focal neurological deficits noted cranial nerves II through XII grossly intact. DTRs 2+ bilaterally upper and lower extremities. Strength 5 out of 5 in bilateral upper and lower extremities. Musculoskeletal: No warm swelling or erythema around joints, no spinal  tenderness noted. Psychiatric: Patient alert and oriented x3, good insight and cognition, good recent to remote recall. Lymph node survey: No cervical axillary or inguinal lymphadenopathy noted.  Lab Results:  Basic Metabolic Panel:    Component Value Date/Time   NA 136 10/08/2018 0855   NA 139 08/17/2018 0935   K 3.4 (L) 10/08/2018 0855   CL 105 10/08/2018 0855   CO2 24 10/08/2018 0855   BUN <5 (L) 10/08/2018 0855   BUN 9 08/17/2018 0935   CREATININE 0.35 (L) 10/08/2018 0855   GLUCOSE 99 10/08/2018 0855   CALCIUM 9.1 10/08/2018 0855   CBC:    Component Value Date/Time   WBC 7.9 10/08/2018 0855   HGB 8.9 (L) 10/08/2018 0855   HGB 7.9 (L) 09/15/2018 0933   HCT 25.3 (L) 10/08/2018 0855   HCT 22.8 (L) 09/15/2018 0933   PLT 563 (H) 10/08/2018 0855   PLT 555 (H) 09/15/2018 0933   MCV 86.3 10/08/2018 0855   MCV 90 09/15/2018 0933   NEUTROABS 4.5 10/08/2018 0855   NEUTROABS 7.9 (H) 09/15/2018 0933   LYMPHSABS 1.9 10/08/2018 0855   LYMPHSABS 2.4 09/15/2018 0933   MONOABS 1.2 (H) 10/08/2018 0855   EOSABS 0.2 10/08/2018 0855   EOSABS 0.1 09/15/2018 0933   BASOSABS 0.0 10/08/2018 0855   BASOSABS 0.0 09/15/2018 0933    No results found for this or any previous visit (from the past 240 hour(s)).  Studies/Results: No results found.  Medications: Scheduled Meds: . enoxaparin (LOVENOX) injection  40 mg Subcutaneous Q24H  . folic acid  1 mg Oral Daily  . HYDROmorphone   Intravenous Q4H  . prenatal multivitamin  1 tablet Oral Daily  . senna-docusate  1 tablet Oral BID  . Vitamin D (Ergocalciferol)  50,000 Units Oral Weekly   Continuous Infusions: . dextrose 5 % and 0.45% NaCl 125 mL/hr at 10/07/18 1500   PRN Meds:.diphenhydrAMINE **OR** diphenhydrAMINE, naloxone **AND** sodium chloride flush, ondansetron, ondansetron (ZOFRAN) IV, polyethylene glycol  Assessment/Plan: Principal Problem:   Sickle cell anemia with crisis ([place]) Active Problems:   Anemia of chronic  disease   Hypokalemia   Supervision of high risk pregnancy, antepartum  Sickle cell anemia, hemoglobin SS with pain crisis: Continue IVF at 50 mL/h Continue custom dose Dilaudid PCA Oxycodone 10 mg every 4 hours as needed for moderate to severe breakthrough pain Monitor vitals very closely Reevaluate pain scale regularly Maintain oxygen saturation above 90%  Anemia of chronic disease: Hemoglobin stable.  CBC pending  Second trimester pregnancy: Baby is followed by obstetrics and appears to be in stable condition.  Consult obstetrician for safety monitoring  Hypokalemia: Potassium repleted.  BMP pending  Leukocytosis: Stable.  CBC pending  Chronic pain syndrome: Hold OxyContin 30 mg every 12 hours Oxycodone 5 mg every 6 hours as needed for moderate to severe breakthrough pain.   Code Status: Full Code Family Communication: N/A Disposition Plan: Not yet ready for discharge  [place], MSN, FNP-C Patient [facility] [address], [place] [address] [phone]  If 5PM-7AM, please contact night-coverage.  10/09/2018, 9:26 AM  LOS: 2 days

## 2018-10-09 NOTE — Progress Notes (Signed)
Initial Nutrition Assessment  INTERVENTION:   -Provide Ensure Enlive po BID, each supplement provides 350 kcal and 20 grams of protein per patient request -Patient would like to be weighed, will order weight measurement.  NUTRITION DIAGNOSIS:   Inadequate oral intake related to nausea, vomiting as evidenced by per patient/family report.  GOAL:   Patient will meet greater than or equal to 90% of their needs  MONITOR:   PO intake, Supplement acceptance, Labs, Weight trends, I & O's  REASON FOR ASSESSMENT:   Consult Assessment of nutrition requirement/status  ASSESSMENT:   27 year old female with a medical history significant of sickle cell anemia and chronic pain syndrome. Patient is currently in 2nd trimester of pregnancy, she is 20 weeks and 7 days.   Patient in room, visibly upset and crying in her bed. Pt was still agreeable to talking with RD. Pt states she is in some pain at this time. Pt denies nausea currently but states it usually returns. States she had really bad morning sickness during her 1st trimester where she could not tolerate any food other than pickled eggs. States she her N/V symptoms are not as bad as during her 1st trimester. States now she can tolerate most foods. Pt did have an episode of vomiting 12/8.  Requests Ensure supplements, will order.  Per patient her pre-pregnancy weight is 154-155 lb. States she weighed 165 lb at her last doctor's visit on 12/4. Would like an updated weight taken this admission, will order. Has gained 10 lb this pregnancy. Recommended weight gain is 15-25 lb.   Medications: Folic acid tablet daily, Prenatal MVI daily, Senokot-S tablet BID, Vitamin D capsule weekly, D5 -.45% NaCl infusion at 50 ml/hr, IV Zofran PRN Labs reviewed: Low K   NUTRITION - FOCUSED PHYSICAL EXAM:  Nutrition focused physical exam shows no sign of depletion of muscle mass or body fat.  Diet Order:   Diet Order            Diet regular Room service  appropriate? Yes  Diet effective now              EDUCATION NEEDS:   Education needs have been addressed  Skin:  Skin Assessment: Reviewed RN Assessment  Last BM:  12/7  Height:   Ht Readings from Last 1 Encounters:  10/07/18 5\' 3"  (1.6 m)    Weight:   Wt Readings from Last 1 Encounters:  10/07/18 74.8 kg    Ideal Body Weight:  52.3 kg  BMI:  Body mass index is 29.23 kg/m.  Estimated Nutritional Needs:   Kcal:  1650-1850  Protein:  75-85g  Fluid:  2.4-2.6L/day  Clayton Bibles, MS, RD, LDN [place] Dietitian Pager: [phone] After Hours Pager: [phone]

## 2018-10-09 NOTE — Progress Notes (Signed)
Spoke with Dr. Harolyn Rutherford. Received call from [place] about daily doppler of fetal heart rate. Pt is [redacted] weeks gestation and is previable so it is not necessary to doppler fetal heart tones daily. There is documentation on 10/07/2018 of a fetal heart rate of 160 BPM. If the pt is hospitalized for a week or more, then we can doppler the fetal heart rate weekly until the age of viability.  The order for daily doppler of the fetal heart rate has been discontinued. If there are any further questions, Dr. Harolyn Rutherford can be reached at [phone].

## 2018-10-10 ENCOUNTER — Other Ambulatory Visit (HOSPITAL_COMMUNITY): Payer: Self-pay | Admitting: *Deleted

## 2018-10-10 DIAGNOSIS — Z362 Encounter for other antenatal screening follow-up: Secondary | ICD-10-CM
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
## 2018-10-10 NOTE — Progress Notes (Signed)
Subjective: Carmen [name], a 27 year old female with a medical history significant of sickle cell anemia and chronic pain syndrome. Patient is currently in 2nd trimester of pregnancy, she is  21 weeks.   Patient was followed by obstetrician prior to admission.  Pain intensity 8/10 this am primarily to  lower extremities. Patient endorses nausea and some vomiting that is slightly improved with Zofran.  Patient says that she did not sleep well on last night. She denies headache, chest pain, dizziness, shortness of breath, constipation or diarrhea.   Objective:  Vital signs in last 24 hours:  Vitals:   10/10/18 0110 10/10/18 0413 10/10/18 0612 10/10/18 0650  BP:   (!) 100/53   Pulse:   66   Resp: 16 11 17 15   Temp:   98.4 F (36.9 C)   TempSrc:      SpO2: 98% 98% 98% 98%  Weight:      Height:        Intake/Output from previous day:   Intake/Output Summary (Last 24 hours) at 10/10/2018 1124 Last data filed at 10/09/2018 1818 Gross per 24 hour  Intake 240 ml  Output -  Net 240 ml    Physical Exam: General: Alert, awake, oriented x3, in no acute distress.  HEENT: [place]/AT PEERL, EOMI Neck: Trachea midline,  no masses, no thyromegal,y no JVD, no carotid bruit OROPHARYNX:  Moist, No exudate/ erythema/lesions.  Heart: Regular rate and rhythm, without murmurs, rubs, gallops, PMI non-displaced, no heaves or thrills on palpation.  Lungs: Clear to auscultation, no wheezing or rhonchi noted. No increased vocal fremitus resonant to percussion  Abdomen: Soft, nontender, nondistended, positive bowel sounds, no masses no hepatosplenomegaly noted..  Neuro: No focal neurological deficits noted cranial nerves II through XII grossly intact. DTRs 2+ bilaterally upper and lower extremities. Strength 5 out of 5 in bilateral upper and lower extremities. Musculoskeletal: No warm swelling or erythema around joints, no spinal tenderness noted. Psychiatric: Patient alert and oriented x3, good insight and  cognition, good recent to remote recall. Lymph node survey: No cervical axillary or inguinal lymphadenopathy noted.  Lab Results:  Basic Metabolic Panel:    Component Value Date/Time   NA 136 10/08/2018 0855   NA 139 08/17/2018 0935   K 3.4 (L) 10/08/2018 0855   CL 105 10/08/2018 0855   CO2 24 10/08/2018 0855   BUN <5 (L) 10/08/2018 0855   BUN 9 08/17/2018 0935   CREATININE 0.35 (L) 10/08/2018 0855   GLUCOSE 99 10/08/2018 0855   CALCIUM 9.1 10/08/2018 0855   CBC:    Component Value Date/Time   WBC 7.9 10/08/2018 0855   HGB 8.9 (L) 10/08/2018 0855   HGB 7.9 (L) 09/15/2018 0933   HCT 25.3 (L) 10/08/2018 0855   HCT 22.8 (L) 09/15/2018 0933   PLT 563 (H) 10/08/2018 0855   PLT 555 (H) 09/15/2018 0933   MCV 86.3 10/08/2018 0855   MCV 90 09/15/2018 0933   NEUTROABS 4.5 10/08/2018 0855   NEUTROABS 7.9 (H) 09/15/2018 0933   LYMPHSABS 1.9 10/08/2018 0855   LYMPHSABS 2.4 09/15/2018 0933   MONOABS 1.2 (H) 10/08/2018 0855   EOSABS 0.2 10/08/2018 0855   EOSABS 0.1 09/15/2018 0933   BASOSABS 0.0 10/08/2018 0855   BASOSABS 0.0 09/15/2018 0933    No results found for this or any previous visit (from the past 240 hour(s)).  Studies/Results: No results found.  Medications: Scheduled Meds: . enoxaparin (LOVENOX) injection  40 mg Subcutaneous Q24H  . feeding supplement (ENSURE  ENLIVE)  237 mL Oral BID BM  . folic acid  1 mg Oral Daily  . HYDROmorphone   Intravenous Q4H  . prenatal multivitamin  1 tablet Oral Daily  . senna-docusate  1 tablet Oral BID  . Vitamin D (Ergocalciferol)  50,000 Units Oral Weekly   Continuous Infusions: . dextrose 5 % and 0.45% NaCl 50 mL/hr at 10/09/18 0950   PRN Meds:.diphenhydrAMINE **OR** diphenhydrAMINE, naloxone **AND** sodium chloride flush, ondansetron, oxyCODONE, polyethylene glycol  Assessment/Plan: Principal Problem:   Sickle cell anemia with crisis ([place]) Active Problems:   Sickle cell pain crisis ([place])   Anemia of chronic disease    Hypokalemia   Supervision of high risk pregnancy, antepartum  Sickle cell anemia, hemoglobin SS with pain crisis: Continue IVF at [facility] custom dose Dilaudid PCA Oxycodone 10 mg every 4 hours as needed for moderate to severe breakthrough pain Monitor vitals very closely Reevaluate pain scale regularly Maintain oxygen saturation above 90% Will review hemoglobinopathy as results become available  Anemia of chronic disease: Hemoglobin stable.  Continue to monitor closely. No transfusion warranted at this time.   Second trimester pregnancy: Baby is followed by obstetrics and appears to be in stable condition.  Consult obstetrician for safety monitoring  Hypokalemia: Potassium repleted.  Potassium 3.4 on 10/10/2018. Continue to monitor closely  Leukocytosis: Resolved.   Continue to follow CBCs  Chronic pain syndrome: Hold OxyContin 30 mg every 12 hours Oxycodone 10 mg every 6 hours as needed for moderate to severe breakthrough pain.   Code Status: Full Code Family Communication: N/A Disposition Plan: Not yet ready for discharge  [place], MSN, FNP-C Patient [facility] [address] [place], [place] [address] [phone]  If 5PM-7AM, please contact night-coverage.  10/10/2018, 11:24 AM  LOS: 3 days
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
## 2018-10-11 ENCOUNTER — Ambulatory Visit (HOSPITAL_COMMUNITY): Payer: Medicaid Other

## 2018-10-11 ENCOUNTER — Telehealth: Payer: Self-pay

## 2018-10-11 LAB — BASIC METABOLIC PANEL
Anion gap: 9 (ref 5–15)
BUN: 8 mg/dL (ref 6–20)
CO2: 23 mmol/L (ref 22–32)
Calcium: 9.3 mg/dL (ref 8.9–10.3)
Chloride: 104 mmol/L (ref 98–111)
Creatinine, Ser: 0.38 mg/dL — ABNORMAL LOW (ref 0.44–1.00)
GFR calc Af Amer: >60 mL/min (ref >60)
GFR calc non Af Amer: >60 mL/min (ref >60)
GLUCOSE: 98 mg/dL (ref 70–99)
Potassium: 3.7 mmol/L (ref 3.5–5.1)
Sodium: 136 mmol/L (ref 135–145)

## 2018-10-11 LAB — CBC
HCT: 22.5 % — ABNORMAL LOW (ref 36.0–46.0)
Hemoglobin: 8 g/dL — ABNORMAL LOW (ref 12.0–15.0)
MCH: 31.6 pg (ref 26.0–34.0)
MCHC: 35.6 g/dL (ref 30.0–36.0)
MCV: 88.9 fL (ref 80.0–100.0)
PLATELETS: 528 10*3/uL — AB (ref 150–400)
RBC: 2.53 MIL/uL — AB (ref 3.87–5.11)
RDW: 14.5 % (ref 11.5–15.5)
WBC: 14.7 10*3/uL — ABNORMAL HIGH (ref 4.0–10.5)
nRBC: 1.6 % — ABNORMAL HIGH (ref 0.0–0.2)

## 2018-10-11 MED ORDER — ACETAMINOPHEN 325 MG PO TABS
650.0000 mg | ORAL_TABLET | Freq: Four times a day (QID) | ORAL | Status: DC | PRN
  Administered 2018-10-11: 650 mg via ORAL
  Filled 2018-10-11: qty 2

## 2018-10-11 NOTE — Progress Notes (Signed)
Subjective: Carmen [name], a 27 year old female with a medical history significant of sickle cell anemia and chronic pain syndrome. Patient is currently in 2nd trimester of pregnancy, she is  21 weeks 1 day  Patient was followed by obstetrician prior to admission.  Pain intensity 8/10 this am primarily to  lower extremities.  Patient is very tearful this am. Endorses increased stress due to family constraints.   Patient says that she did not sleep well on last night due to increased stress.    She denies headache, chest pain, dizziness, shortness of breath, constipation or diarrhea.   Objective:  Vital signs in last 24 hours:  Vitals:   10/11/18 0130 10/11/18 0410 10/11/18 0415 10/11/18 0744  BP:   114/71   Pulse:   89   Resp: 17 13 16 11   Temp:   98.3 F (36.8 C)   TempSrc:   Oral   SpO2: 99% 100% 100% 100%  Weight:      Height:        Intake/Output from previous day:   Intake/Output Summary (Last 24 hours) at 10/11/2018 1028 Last data filed at 10/11/2018 0900 Gross per 24 hour  Intake 477 ml  Output -  Net 477 ml    Physical Exam: General: Alert, awake, oriented x3, in no acute distress.  HEENT: [place]/AT PEERL, EOMI Neck: Trachea midline,  no masses, no thyromegal,y no JVD, no carotid bruit OROPHARYNX:  Moist, No exudate/ erythema/lesions.  Heart: Regular rate and rhythm, without murmurs, rubs, gallops, PMI non-displaced, no heaves or thrills on palpation.  Lungs: Clear to auscultation, no wheezing or rhonchi noted. No increased vocal fremitus resonant to percussion  Abdomen: Soft, nontender, nondistended, positive bowel sounds, no masses no hepatosplenomegaly noted..  Neuro: No focal neurological deficits noted cranial nerves II through XII grossly intact. DTRs 2+ bilaterally upper and lower extremities. Strength 5 out of 5 in bilateral upper and lower extremities. Musculoskeletal: No warm swelling or erythema around joints, no spinal tenderness noted. Psychiatric:  Patient tearful. Patient alert and oriented x3, good insight and cognition, good recent to remote recall. Lymph node survey: No cervical axillary or inguinal lymphadenopathy noted.  Lab Results:  Basic Metabolic Panel:    Component Value Date/Time   NA 136 10/11/2018 0644   NA 139 08/17/2018 0935   K 3.7 10/11/2018 0644   CL 104 10/11/2018 0644   CO2 23 10/11/2018 0644   BUN 8 10/11/2018 0644   BUN 9 08/17/2018 0935   CREATININE 0.38 (L) 10/11/2018 0644   GLUCOSE 98 10/11/2018 0644   CALCIUM 9.3 10/11/2018 0644   CBC:    Component Value Date/Time   WBC 14.7 (H) 10/11/2018 0644   HGB 8.0 (L) 10/11/2018 0644   HGB 7.9 (L) 09/15/2018 0933   HCT 22.5 (L) 10/11/2018 0644   HCT 22.8 (L) 09/15/2018 0933   PLT 528 (H) 10/11/2018 0644   PLT 555 (H) 09/15/2018 0933   MCV 88.9 10/11/2018 0644   MCV 90 09/15/2018 0933   NEUTROABS 4.5 10/08/2018 0855   NEUTROABS 7.9 (H) 09/15/2018 0933   LYMPHSABS 1.9 10/08/2018 0855   LYMPHSABS 2.4 09/15/2018 0933   MONOABS 1.2 (H) 10/08/2018 0855   EOSABS 0.2 10/08/2018 0855   EOSABS 0.1 09/15/2018 0933   BASOSABS 0.0 10/08/2018 0855   BASOSABS 0.0 09/15/2018 0933    No results found for this or any previous visit (from the past 240 hour(s)).  Studies/Results: No results found.  Medications: Scheduled Meds: . enoxaparin (LOVENOX)  injection  40 mg Subcutaneous Q24H  . feeding supplement (ENSURE ENLIVE)  237 mL Oral BID BM  . folic acid  1 mg Oral Daily  . HYDROmorphone   Intravenous Q4H  . prenatal multivitamin  1 tablet Oral Daily  . senna-docusate  1 tablet Oral BID  . Vitamin D (Ergocalciferol)  50,000 Units Oral Weekly   Continuous Infusions: . dextrose 5 % and 0.45% NaCl 10 mL/hr at 10/11/18 0745   PRN Meds:.acetaminophen, diphenhydrAMINE **OR** diphenhydrAMINE, naloxone **AND** sodium chloride flush, ondansetron, oxyCODONE, polyethylene glycol  Assessment/Plan: Principal Problem:   Sickle-cell disease with pain (HCC) Active  Problems:   Sickle cell pain crisis ([place])   Anemia of chronic disease   Hypokalemia   Supervision of high risk pregnancy, antepartum  Sickle cell anemia, hemoglobin SS with pain crisis: Continue IVF at [facility] custom dose Dilaudid PCA Oxycodone 10 mg every 4 hours as needed for moderate to severe breakthrough pain Monitor vitals very closely Reevaluate pain scale regularly Maintain oxygen saturation above 90% Will review hemoglobinopathy as results become available  Anemia of chronic disease: Hemoglobin stable.  Continue to monitor closely. No transfusion warranted at this time.   Second trimester pregnancy: Obstetrics will examine on day of discharge. Otherwise, patient to follow up as outpatient.   Hypokalemia: Potassium repleted.  BMP pending. Continue to monitor closely  Leukocytosis: Resolved.   Continue to follow CBCs  Chronic pain syndrome: Hold OxyContin 30 mg every 12 hours Oxycodone 10 mg every 6 hours as needed for moderate to severe breakthrough pain.   Code Status: Full Code Family Communication: N/A Disposition Plan: Not yet ready for discharge  [place], MSN, FNP-C Patient [place] [address] [place], [place] [address] [phone]  If 5PM-7AM, please contact night-coverage.  10/11/2018, 10:28 AM  LOS: 4 days

## 2018-10-12 ENCOUNTER — Encounter: Payer: Medicaid Other | Admitting: Obstetrics and Gynecology

## 2018-10-12 MED ORDER — OXYCODONE HCL ER 10 MG PO T12A
30.0000 mg | EXTENDED_RELEASE_TABLET | Freq: Two times a day (BID) | ORAL | Status: DC
  Administered 2018-10-12 – 2018-10-13 (×3): 30 mg via ORAL
  Filled 2018-10-12: qty 3
  Filled 2018-10-12: qty 2
  Filled 2018-10-12: qty 3

## 2018-10-12 MED ORDER — NALOXONE HCL 0.4 MG/ML IJ SOLN: 0.4000 mg | INTRAMUSCULAR | Status: DC | PRN

## 2018-10-12 MED ORDER — OXYCODONE HCL 5 MG PO TABS
10.0000 mg | ORAL_TABLET | ORAL | Status: DC
  Administered 2018-10-12 (×2): 10 mg via ORAL
  Filled 2018-10-12 (×2): qty 2

## 2018-10-12 MED ORDER — HYDROMORPHONE HCL 1 MG/ML IJ SOLN
1.0000 mg | Freq: Once | INTRAMUSCULAR | Status: AC
  Administered 2018-10-12: 1 mg via INTRAVENOUS
  Filled 2018-10-12: qty 1

## 2018-10-12 MED ORDER — SODIUM CHLORIDE 0.9% FLUSH: 9.0000 mL | INTRAVENOUS | Status: DC | PRN

## 2018-10-12 MED ORDER — HYDROMORPHONE 1 MG/ML IV SOLN
INTRAVENOUS | Status: DC
  Administered 2018-10-12: 0 mg via INTRAVENOUS
  Administered 2018-10-13: 02:00:00 via INTRAVENOUS
  Filled 2018-10-12: qty 25

## 2018-10-12 NOTE — Progress Notes (Signed)
Subjective: Pain intensity 8/10 this am primarily to  lower extremities and right side.   She denies headache, chest pain, dizziness, shortness of breath, constipation or diarrhea.   Objective:  Vital signs in last 24 hours:  Vitals:   10/12/18 0429 10/12/18 0756 10/12/18 1007 10/12/18 1418  BP:    (!) 111/59  Pulse:    95  Resp: 20 18 15    Temp:    98.8 F (37.1 C)  TempSrc:    Oral  SpO2: 100% 99% 100% 99%  Weight:      Height:        Intake/Output from previous day:  No intake or output data in the 24 hours ending 10/12/18 1539  Physical Exam: General: Alert, awake, oriented x3, in no acute distress.  HEENT: [place]/AT PEERL, EOMI Neck: Trachea midline,  no masses, no thyromegal,y no JVD, no carotid bruit OROPHARYNX:  Moist, No exudate/ erythema/lesions.  Heart: Regular rate and rhythm, without murmurs, rubs, gallops, PMI non-displaced, no heaves or thrills on palpation.  Lungs: Clear to auscultation, no wheezing or rhonchi noted. No increased vocal fremitus resonant to percussion  Abdomen: Soft, nontender, nondistended, positive bowel sounds, no masses no hepatosplenomegaly noted..  Neuro: No focal neurological deficits noted cranial nerves II through XII grossly intact. DTRs 2+ bilaterally upper and lower extremities. Strength 5 out of 5 in bilateral upper and lower extremities. Musculoskeletal: No warm swelling or erythema around joints, no spinal tenderness noted. Psychiatric: Patient tearful. Patient alert and oriented x3, good insight and cognition, good recent to remote recall. Lymph node survey: No cervical axillary or inguinal lymphadenopathy noted.  Lab Results:  Basic Metabolic Panel:    Component Value Date/Time   NA 136 10/11/2018 0644   NA 139 08/17/2018 0935   K 3.7 10/11/2018 0644   CL 104 10/11/2018 0644   CO2 23 10/11/2018 0644   BUN 8 10/11/2018 0644   BUN 9 08/17/2018 0935   CREATININE 0.38 (L) 10/11/2018 0644   GLUCOSE 98 10/11/2018 0644   CALCIUM 9.3 10/11/2018 0644   CBC:    Component Value Date/Time   WBC 14.7 (H) 10/11/2018 0644   HGB 8.0 (L) 10/11/2018 0644   HGB 7.9 (L) 09/15/2018 0933   HCT 22.5 (L) 10/11/2018 0644   HCT 22.8 (L) 09/15/2018 0933   PLT 528 (H) 10/11/2018 0644   PLT 555 (H) 09/15/2018 0933   MCV 88.9 10/11/2018 0644   MCV 90 09/15/2018 0933   NEUTROABS 4.5 10/08/2018 0855   NEUTROABS 7.9 (H) 09/15/2018 0933   LYMPHSABS 1.9 10/08/2018 0855   LYMPHSABS 2.4 09/15/2018 0933   MONOABS 1.2 (H) 10/08/2018 0855   EOSABS 0.2 10/08/2018 0855   EOSABS 0.1 09/15/2018 0933   BASOSABS 0.0 10/08/2018 0855   BASOSABS 0.0 09/15/2018 0933    No results found for this or any previous visit (from the past 240 hour(s)).  Studies/Results: No results found.  Medications: Scheduled Meds: . enoxaparin (LOVENOX) injection  40 mg Subcutaneous Q24H  . feeding supplement (ENSURE ENLIVE)  237 mL Oral BID BM  . folic acid  1 mg Oral Daily  . oxyCODONE  10 mg Oral Q4H while awake  . oxyCODONE  30 mg Oral Q12H  . prenatal multivitamin  1 tablet Oral Daily  . senna-docusate  1 tablet Oral BID  . Vitamin D (Ergocalciferol)  50,000 Units Oral Weekly   Continuous Infusions: . dextrose 5 % and 0.45% NaCl 10 mL/hr at 10/11/18 0745   PRN Meds:.acetaminophen, ondansetron, polyethylene  glycol  Assessment/Plan: Principal Problem:   Sickle-cell disease with pain (HCC) Active Problems:   Sickle cell pain crisis ([place])   Anemia of chronic disease   Hypokalemia   Supervision of high risk pregnancy, antepartum  Sickle cell anemia, hemoglobin SS with pain crisis: Continue [facility] at [facility] PCA discontinued Oxycodone 10 mg every 4 hours scheduled. Monitor vitals very closely Reevaluate pain scale regularly Maintain oxygen saturation above 90% Will review hemoglobinopathy as results become available  Anemia of chronic disease: Hemoglobin stable.  Continue to monitor closely. No transfusion warranted at this time.   Second  trimester pregnancy: Obstetrics will examine on day of discharge. Otherwise, patient to follow up as outpatient.   Hypokalemia: Potassium repleted.  BMP pending. Continue to monitor closely  Leukocytosis: Resolved.   Continue to follow CBCs  Chronic pain syndrome: Oxycontin 30 mg every 12 hours   Code Status: Full Code Family Communication: N/A Disposition Plan: Not yet ready for discharge  [place], MSN, FNP-C Patient [place] [address] [place], [place] [address] [phone]  If 5PM-7AM, please contact night-coverage.  10/12/2018, 3:39 PM  LOS: 5 days

## 2018-10-13 ENCOUNTER — Ambulatory Visit: Payer: Medicaid Other | Admitting: Family Medicine

## 2018-10-13 ENCOUNTER — Telehealth: Payer: Self-pay

## 2018-10-13 DIAGNOSIS — D571 Sickle-cell disease without crisis: Secondary | ICD-10-CM

## 2018-10-13 LAB — HEMOGLOBINOPATHY EVALUATION
Hgb A2 Quant: 4.3 % — ABNORMAL HIGH (ref 1.8–3.2)
Hgb A: 0 % — ABNORMAL LOW (ref 96.4–98.8)
Hgb C: 0 %
Hgb F Quant: 15.1 % — ABNORMAL HIGH (ref 0.0–2.0)
Hgb S Quant: 80.6 % — ABNORMAL HIGH
Hgb Variant: 0 %

## 2018-10-13 MED ORDER — OXYCODONE HCL ER 30 MG PO T12A: 1.0000 | EXTENDED_RELEASE_TABLET | Freq: Two times a day (BID) | ORAL | 0 refills | Status: DC

## 2018-10-13 MED ORDER — OXYCODONE HCL 5 MG PO CAPS: 5.0000 mg | ORAL_CAPSULE | ORAL | 0 refills | Status: DC | PRN

## 2018-10-13 NOTE — Telephone Encounter (Signed)
Refilled medications. Added IR 5 mg oxycodone for break through pain.

## 2018-10-13 NOTE — Discharge Summary (Signed)
Physician Discharge Summary  Carmen [name] [id number]:[id number] DOB: [birth year] DOA: 10/07/2018  PCP: Lanae Boast, FNP  Admit date: 10/07/2018  Discharge date: 10/13/2018  Discharge Diagnoses:  Principal Problem:   Sickle-cell disease with pain ([place]) Active Problems:   Sickle cell pain crisis ([place])   Anemia of chronic disease   Hypokalemia   Supervision of high risk pregnancy, antepartum   Discharge Condition: Stable  Disposition:  Follow-up Information    Lanae Boast, FNP Follow up.   Specialty:  Family Medicine Contact information: [place] [address] [phone]          Pt is discharged home in good condition and is to follow up with Lanae Boast, FNP in 1 week to have labs evaluated. Carmen [name] is instructed to increase activity slowly and balance with rest for the next few days, and use prescribed medication to complete treatment of pain  Diet: Regular Wt Readings from Last 3 Encounters:  10/09/18 72.1 kg  10/04/18 73.8 kg  09/29/18 74.4 kg    History of present illness:  Carmen [name], a 27 year old female with a history of sickle cell anemia, chronic pain syndrome and opiate dependence who is in second trimester pregnancy presented to the ER with pain in upper and lower extremities with an intensity of 10/10.  Patient was treated with IV Dilaudid and IV fluid in the ER without sustained relief.  Patient was admitted for further pain management.  She denied nausea, vomiting, or diarrhea.  Patient denied any other significant findings.  Hospital Course:  Carmen [name] was admitted for sickle cell pain crisis.  Patient was managed appropriately with IV Dilaudid via PCA, Tylenol, and other adjunct therapies per sickle cell pain management protocol.  IV Dilaudid was weaned appropriately and oral opiate regimen initiated for further pain control. Patient has a history of chronic pain syndrome and was continued on OxyContin 30 mg every 12  hours.  Consulted with patient's primary care provider.  Patient will complete treatment with oxycodone 5 mg every 4 hours as needed for moderate to severe breakthrough pain.  Pain intensity 4-5/10 on discharge.  Patient alert, oriented, and ambulating.  Patient is in second trimester of pregnancy.  She is followed by obstetrics outpatient line.  Patient states that she has a follow-up appointment scheduled in 1 week.  Patient was discharged home today in a hemodynamically stable condition.   Discharge Exam: Vitals:   10/13/18 1201 10/13/18 1425  BP:  128/70  Pulse:  89  Resp: 13 16  Temp:    SpO2: 100%    Vitals:   10/13/18 0626 10/13/18 0820 10/13/18 1201 10/13/18 1425  BP:    128/70  Pulse:    89  Resp: 19 13 13 16   Temp:      TempSrc:      SpO2: 98% 100% 100%   Weight:      Height:       Physical Exam Constitutional:      Appearance: She is well-developed and well-nourished.  HENT:     Head: Normocephalic and atraumatic.  Eyes:     Pupils: Pupils are equal, round, and reactive to light.  Neck:     Musculoskeletal: Normal range of motion.  Cardiovascular:     Rate and Rhythm: Normal rate and regular rhythm.  Pulmonary:     Effort: Pulmonary effort is normal.     Breath sounds: Normal breath sounds.  Abdominal:     General:  Bowel sounds are normal.     Palpations: Abdomen is soft.  Musculoskeletal: Normal range of motion.  Skin:    General: Skin is warm.  Neurological:     Mental Status: She is alert and oriented to person, place, and time.  Psychiatric:        Mood and Affect: Mood and affect normal.        Behavior: Behavior normal.        Thought Content: Thought content normal.        Judgment: Judgment normal.      Discharge Instructions  Discharge Instructions    Discharge patient   Complete by:  As directed    Discharge disposition:  01-Home or Self Care   Discharge patient date:  10/13/2018     Allergies as of 10/13/2018   No Known  Allergies     Medication List    TAKE these medications   aspirin EC 81 MG tablet Take 1 tablet (81 mg total) by mouth daily.   ergocalciferol 1.25 MG (50000 UT) capsule Commonly known as:  VITAMIN D2 Take 1 capsule (50,000 Units total) by mouth once a week.   folic acid 1 MG tablet Commonly known as:  FOLVITE Take 1 tablet (1 mg total) by mouth daily.   oxyCODONE 30 MG 12 hr tablet Commonly known as:  OXYCONTIN Take 1 tablet (30 mg total) by mouth every 12 (twelve) hours. What changed:    when to take this  reasons to take this   oxycodone 5 MG capsule Commonly known as:  OXY-IR Take 1 capsule (5 mg total) by mouth every 4 (four) hours as needed for up to 15 days. What changed:  You were already taking a medication with the same name, and this prescription was added. Make sure you understand how and when to take each.   PREPLUS 27-1 MG Tabs Take 1 tablet by mouth daily.       The results of significant diagnostics from this hospitalization (including imaging, microbiology, ancillary and laboratory) are listed below for reference.    Significant Diagnostic Studies: [place] Mfm Ob Follow Up  Result Date: 10/04/2018 ----------------------------------------------------------------------  OBSTETRICS REPORT                       (Signed Final 10/04/2018 05:34 pm) ---------------------------------------------------------------------- Patient Info  ID #:       [id number]                          D.O.B.:  Jan 03, 1991 (27 yrs)  Name:       Carmen [name]                  Visit Date: 10/04/2018 03:48 pm ---------------------------------------------------------------------- Performed By  Performed By:     Enriqueta Shutter           Ref. Address:     [place], [facility]                                                             [facility]  [place], [place]  Attending:        Lajoyce Lauber      Location:         [facility]                    MD  Referred By:      [facility] for                    [place] ---------------------------------------------------------------------- Orders   #  Description                          Code         Ordered By   1  [place] MFM OB FOLLOW UP                  63016.01     Arlina Robes  ----------------------------------------------------------------------   #  Order #                    Accession #                 Episode #   1  [id number]                  [id number]                  [id number]  ---------------------------------------------------------------------- Indications   [redacted] weeks gestation of pregnancy                Z3A.20   Encounter for antenatal screening for          Z36.3   malformations   Maternal sickle cell anemia, second trimester  O99.012, V76.1   Drug use complicating pregnancy, second        O99.322   trimester (chronic opiate use)  ---------------------------------------------------------------------- Fetal Evaluation  Num Of Fetuses:         1  Fetal Heart Rate(bpm):  159  Cardiac Activity:       Observed  Presentation:           Breech  Placenta:  Posterior  P. Cord Insertion:      Visualized  Amniotic Fluid  AFI FV:      Within normal limits subjectively                              Largest Pocket(cm)                              5.02 ---------------------------------------------------------------------- Biometry  BPD:      42.7  mm     G. Age:  18w 6d          6  %    CI:        63.45   %    70 - 86                                                          FL/HC:      17.8   %    16.8 - 19.8  HC:      172.7  mm     G. Age:  19w 6d         23  %    HC/AC:      1.13        1.09 - 1.39  AC:       152.2  mm     G. Age:  20w 3d         71  %    FL/BPD:     72.1   %  FL:       30.8  mm     G. Age:  19w 4d         19  %    FL/AC:      20.2   %    20 - 24  HUM:      30.6  mm     G. Age:  20w 1d         47  %  CER:      19.4  mm     G. Age:  18w 5d          8  %  NFT:         4  mm  LV:        6.4  mm  CM:        3.9  mm  Est. FW:     324  gm    0 lb 11 oz      41  % ---------------------------------------------------------------------- OB History  Gravidity:    2  TOP:          1 ---------------------------------------------------------------------- Gestational Age  LMP:           20w 2d        Date:  05/15/18                 EDD:   02/19/19  U/S Today:     19w 5d                                        EDD:   02/23/19  Best:          20w 2d     Det. By:  LMP  (05/15/18)          EDD:   02/19/19 ---------------------------------------------------------------------- Anatomy  Cranium:               Appears normal         LVOT:                   Not well visualized  Cavum:                 Appears normal         Aortic Arch:            Not well visualized  Ventricles:            Appears normal         Ductal Arch:            Not well visualized  Choroid Plexus:        Appears normal         Diaphragm:              Appears normal  Cerebellum:            Appears normal         Stomach:                Appears normal, left                                                                        sided  Posterior Fossa:       Appears normal         Abdomen:                Appears normal  Nuchal Fold:           Appears normal         Abdominal Wall:         Appears nml (cord                                                                        insert, abd wall)  Face:                  Appears normal         Cord Vessels:           Appears normal (3                         (orbits and profile)                           vessel cord)  Lips:                  Appears normal         Kidneys:  Appear normal  Palate:                 Appears normal         Bladder:                Appears normal  Thoracic:              Appears normal         Spine:                  Appears normal  Heart:                 Appears normal         Upper Extremities:      Appears normal                         (4CH, axis, and situs  RVOT:                  Not well visualized    Lower Extremities:      Appears normal  Other:  Fetus appears to be a female. Heels and 5th digit visualized. Open          hands visualized. Nasal bone visualized. ---------------------------------------------------------------------- Cervix Uterus Adnexa  Cervix  Length:           4.08  cm.  Normal appearance by transabdominal scan. ---------------------------------------------------------------------- Comments  U/S images reviewed. Findings reviewed.   Appropriate fetal  growth is noted.   No fetal abnormalities are seen.  Recommendations: 1) Serial U/S every 4 weeks for fetal  growth  2) Close A-P surveillance in the third trimester ----------------------------------------------------------------------               Lajoyce Lauber, MD Electronically Signed Final Report   10/04/2018 05:34 pm ----------------------------------------------------------------------   Microbiology: No results found for this or any previous visit (from the past 240 hour(s)).   Labs: Basic Metabolic Panel: Recent Labs  Lab 10/07/18 0458 10/08/18 0855 10/11/18 0644  NA 136 136 136  K 3.7 3.4* 3.7  CL 105 105 104  CO2 21* 24 23  GLUCOSE 83 99 98  BUN 8 <5* 8  CREATININE 0.48 0.35* 0.38*  CALCIUM 9.2 9.1 9.3   Liver Function Tests: Recent Labs  Lab 10/07/18 0458 10/08/18 0855  AST 33 30  ALT 28 25  ALKPHOS 68 63  BILITOT 1.5* 1.5*  PROT 7.6 7.4  ALBUMIN 3.4* 3.3*   No results for input(s): LIPASE, AMYLASE in the last 168 hours. No results for input(s): AMMONIA in the last 168 hours. CBC: Recent Labs  Lab 10/07/18 0458 10/08/18 0855 10/11/18 0644  WBC 11.8* 7.9  14.7*  NEUTROABS 7.8* 4.5  --   HGB 8.8* 8.9* 8.0*  HCT 24.8* 25.3* 22.5*  MCV 88.3 86.3 88.9  PLT 547* 563* 528*   Cardiac Enzymes: No results for input(s): CKTOTAL, CKMB, CKMBINDEX, TROPONINI in the last 168 hours. BNP: Invalid input(s): POCBNP CBG: No results for input(s): GLUCAP in the last 168 hours.  Time coordinating discharge: 30 minutes  Signed:  [name]  APRN, MSN, FNP-C Patient [place] [address] [place], [place] [address] [phone]  [facility] 10/13/2018, 2:37 PM

## 2018-10-13 NOTE — Progress Notes (Signed)
LCSW provided taxi voucher at dc.   Darrik Richman, LSCW [place] CSW [phone]  

## 2018-10-13 NOTE — Progress Notes (Signed)
11 mL PCA Dilaudid wasted with Nancy Fetter, RN.

## 2018-10-13 NOTE — Progress Notes (Signed)
Discharge instructions and medications discussed with patient.  AVS given to patient.  All questions answered.  

## 2018-10-13 NOTE — Care Management Note (Signed)
Case Management Note  Patient Details  Name: Carmen [name] MRN: [id number] Date of Birth: 11-26-1990  Subjective/Objective:                  discharged  Action/Plan: Discharged to home with self-care Orders checked for hhc needs. No CM needs present at time of discharge.  Expected Discharge Date:  10/13/18               Expected Discharge Plan:     In-House Referral:     Discharge planning Services     Post Acute Care Choice:    Choice offered to:     DME Arranged:    DME Agency:     HH Arranged:    HH Agency:     Status of Service:     If discussed at [employer], dates discussed:    Additional Comments:  Leeroy Cha, RN 10/13/2018, 3:05 PM

## 2018-10-13 NOTE — Discharge Instructions (Signed)
Sickle Cell Anemia, Adult °Sickle cell anemia is a condition where your red blood cells are shaped like sickles. Red blood cells carry oxygen through the body. Sickle-shaped red blood cells do not live as long as normal red blood cells. They also clump together and block blood from flowing through the blood vessels. These things prevent the body from getting enough oxygen. Sickle cell anemia causes organ damage and pain. It also increases the risk of infection. °Follow these instructions at home: °· Drink enough fluid to keep your pee (urine) clear or pale yellow. Drink more in hot weather and during exercise. °· Do not smoke. Smoking lowers oxygen levels in the blood. °· Only take over-the-counter or prescription medicines as told by your doctor. °· Take antibiotic medicines as told by your doctor. Make sure you finish them even if you start to feel better. °· Take supplements as told by your doctor. °· Consider wearing a medical alert bracelet. This tells anyone caring for you in an emergency of your condition. °· When traveling, keep your medical information, doctors' names, and the medicines you take with you at all times. °· If you have a fever, do not take fever medicines right away. This could cover up a problem. Tell your doctor. °· Keep all follow-up visits with your doctor. Sickle cell anemia requires regular medical care. °Contact a doctor if: °You have a fever. °Get help right away if: °· You feel dizzy or faint. °· You have new belly (abdominal) pain, especially on the left side near the stomach area. °· You have a lasting, often uncomfortable and painful erection of the penis (priapism). If it is not treated right away, you will become unable to have sex (impotence). °· You have numbness in your arms or legs or you have a hard time moving them. °· You have a hard time talking. °· You have a fever or lasting symptoms for more than 2-3 days. °· You have a fever and your symptoms suddenly get  worse. °· You have signs or symptoms of infection. These include: °? Chills. °? Being more tired than normal (lethargy). °? Irritability. °? Poor eating. °? Throwing up (vomiting). °· You have pain that is not helped with medicine. °· You have shortness of breath. °· You have pain in your chest. °· You are coughing up pus-like or bloody mucus. °· You have a stiff neck. °· Your feet or hands swell or have pain. °· Your belly looks bloated. °· Your joints hurt. °This information is not intended to replace advice given to you by your health care provider. Make sure you discuss any questions you have with your health care provider. °Document Released: 08/08/2013 Document Revised: 03/25/2016 Document Reviewed: 05/30/2013 °Elsevier Interactive Patient Education © 2017 [employer] ° °

## 2018-10-18 ENCOUNTER — Ambulatory Visit (INDEPENDENT_AMBULATORY_CARE_PROVIDER_SITE_OTHER): Payer: Medicaid Other | Admitting: Obstetrics & Gynecology

## 2018-10-18 VITALS — BP 118/76 | HR 98 | Wt 156.5 lb

## 2018-10-18 DIAGNOSIS — D571 Sickle-cell disease without crisis: Secondary | ICD-10-CM

## 2018-10-18 DIAGNOSIS — O0992 Supervision of high risk pregnancy, unspecified, second trimester: Secondary | ICD-10-CM

## 2018-10-18 DIAGNOSIS — O0990 Supervision of high risk pregnancy, unspecified, unspecified trimester: Secondary | ICD-10-CM

## 2018-10-18 MED ORDER — ENSURE HEALTHY MOM PO LIQD: ORAL | 6 refills | Status: DC

## 2018-10-18 NOTE — Progress Notes (Signed)
Home Medicaid Form completed

## 2018-10-18 NOTE — Progress Notes (Signed)
   PRENATAL VISIT NOTE  Subjective:  Carmen [name] is a 27 y.o. G2P0010 at [redacted]w[redacted]d being seen today for ongoing prenatal care.  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  Patient reports no complaints.  Contractions: Not present. Vag. Bleeding: None.  Movement: Present. Denies leaking of fluid.   The following portions of the patient's history were reviewed and updated as appropriate: allergies, current medications, past family history, past medical history, past social history, past surgical history and problem list. Problem list updated.  Objective:   Vitals:   10/18/18 0831  BP: 118/76  Pulse: 98  Weight: 156 lb 8 oz (71 kg)    Fetal Status: Fetal Heart Rate (bpm): 162   Movement: Present     General:  Alert, oriented and cooperative. Patient is in no acute distress.  Skin: Skin is warm and dry. No rash noted.   Cardiovascular: Normal heart rate noted  Respiratory: Normal respiratory effort, no problems with respiration noted  Abdomen: Soft, gravid, appropriate for gestational age.  Pain/Pressure: Present     Pelvic: Cervical exam deferred        Extremities: Normal range of motion.  Edema: None  Mental Status: Normal mood and affect. Normal behavior. Normal judgment and thought content.   Assessment and Plan:  Pregnancy: G2P0010 at [redacted]w[redacted]d  1. Supervision of high risk pregnancy, antepartum  - AFP, Serum, Open Spina Bifida  2. Hb-SS disease without crisis [facility]) - she recently got  discharged from the hospital for crisis, doing well today - serial u/s with MFM for growth  Preterm labor symptoms and general obstetric precautions including but not limited to vaginal bleeding, contractions, leaking of fluid and fetal movement were reviewed in detail with the patient. Please refer to After Visit Summary for other counseling recommendations.  No follow-ups on file.  Future Appointments  Date Time Provider [place]  10/23/2018 10:20 AM Lanae Boast, [place] None  11/03/2018  3:30 PM WH-MFC [place] 5 WH-MFCUS MFC-US    Emily Filbert, MD

## 2018-10-20 LAB — AFP, SERUM, OPEN SPINA BIFIDA
AFP MoM: 1.59
AFP Value: 131.2 ng/mL
Gest. Age on Collection Date: 22.3 weeks
Maternal Age At EDD: 26 yr
OSBR Risk 1 IN: 4310
Test Results:: NEGATIVE
Weight: 156 [lb_av]

## 2018-10-20 LAB — URINE CULTURE, OB REFLEX

## 2018-10-20 LAB — CULTURE, OB URINE

## 2018-10-23 ENCOUNTER — Ambulatory Visit: Payer: Medicaid Other | Admitting: Family Medicine

## 2018-10-30 ENCOUNTER — Other Ambulatory Visit: Payer: Self-pay | Admitting: Family Medicine

## 2018-10-30 ENCOUNTER — Telehealth: Payer: Self-pay

## 2018-10-30 DIAGNOSIS — Z79891 Long term (current) use of opiate analgesic: Secondary | ICD-10-CM

## 2018-10-30 DIAGNOSIS — D571 Sickle-cell disease without crisis: Secondary | ICD-10-CM

## 2018-10-31 ENCOUNTER — Other Ambulatory Visit: Payer: Self-pay | Admitting: Family Medicine

## 2018-10-31 DIAGNOSIS — Z79891 Long term (current) use of opiate analgesic: Secondary | ICD-10-CM

## 2018-10-31 DIAGNOSIS — D571 Sickle-cell disease without crisis: Secondary | ICD-10-CM

## 2018-10-31 MED ORDER — OXYCODONE HCL 5 MG PO CAPS: 5.0000 mg | ORAL_CAPSULE | ORAL | 0 refills | Status: DC | PRN

## 2018-10-31 NOTE — Telephone Encounter (Signed)
Called and spoke with patient, advised that oxycodone has been sent to pharmacy. Patient verbalized understanding. Thanks!

## 2018-11-02 ENCOUNTER — Ambulatory Visit (INDEPENDENT_AMBULATORY_CARE_PROVIDER_SITE_OTHER): Payer: Medicaid Other | Admitting: Family Medicine

## 2018-11-02 ENCOUNTER — Encounter: Payer: Self-pay | Admitting: Family Medicine

## 2018-11-02 VITALS — BP 115/66 | HR 99 | Temp 98.1°F | Resp 16 | Ht 63.0 in | Wt 162.0 lb

## 2018-11-02 DIAGNOSIS — O0990 Supervision of high risk pregnancy, unspecified, unspecified trimester: Secondary | ICD-10-CM | POA: Diagnosis not present

## 2018-11-02 DIAGNOSIS — D571 Sickle-cell disease without crisis: Secondary | ICD-10-CM

## 2018-11-02 DIAGNOSIS — Z79891 Long term (current) use of opiate analgesic: Secondary | ICD-10-CM | POA: Diagnosis not present

## 2018-11-02 LAB — POCT URINALYSIS DIPSTICK
Bilirubin, UA: NEGATIVE
Blood, UA: NEGATIVE
Glucose, UA: NEGATIVE
Ketones, UA: NEGATIVE
Leukocytes, UA: NEGATIVE
Nitrite, UA: NEGATIVE
Protein, UA: NEGATIVE
Spec Grav, UA: 1.01 (ref 1.010–1.025)
Urobilinogen, UA: 2 E.U./dL — AB
pH, UA: 6 (ref 5.0–8.0)

## 2018-11-02 MED ORDER — MORPHINE SULFATE ER 30 MG PO TBCR: 30.0000 mg | EXTENDED_RELEASE_TABLET | Freq: Three times a day (TID) | ORAL | 0 refills | Status: DC | PRN

## 2018-11-02 NOTE — Patient Instructions (Signed)
Commonly Asked Questions During Pregnancy  Cats: A parasite can be excreted in cat feces.  To avoid exposure you need to have another person empty the little box.  If you must empty the litter box you will need to wear gloves.  Wash your hands after handling your cat.  This parasite can also be found in raw or undercooked meat so this should also be avoided.  Colds, Sore Throats, Flu: Please check your medication sheet to see what you can take for symptoms.  If your symptoms are unrelieved by these medications please call the office.  Dental Work: Most any dental work your dentist recommends is permitted.  X-rays should only be taken during the first trimester if absolutely necessary.  Your abdomen should be shielded with a lead apron during all x-rays.  Please notify your provider prior to receiving any x-rays.  Novocaine is fine; gas is not recommended.  If your dentist requires a note from us prior to dental work please call the office and we will provide one for you.  Exercise: Exercise is an important part of staying healthy during your pregnancy.  You may continue most exercises you were accustomed to prior to pregnancy.  Later in your pregnancy you will most likely notice you have difficulty with activities requiring balance like riding a bicycle.  It is important that you listen to your body and avoid activities that put you at a higher risk of falling.  Adequate rest and staying well hydrated are a must!  If you have questions about the safety of specific activities ask your provider.    Exposure to Children with illness: Try to avoid obvious exposure; report any symptoms to us when noted,  If you have chicken pos, red measles or mumps, you should be immune to these diseases.   Please do not take any vaccines while pregnant unless you have checked with your OB provider.  Fetal Movement: After 28 weeks we recommend you do "kick counts" twice daily.  Lie or sit down in a calm quiet environment and  count your baby movements "kicks".  You should feel your baby at least 10 times per hour.  If you have not felt 10 kicks within the first hour get up, walk around and have something sweet to eat or drink then repeat for an additional hour.  If count remains less than 10 per hour notify your provider.  Fumigating: Follow your pest control agent's advice as to how long to stay out of your home.  Ventilate the area well before re-entering.  Hemorrhoids:   Most over-the-counter preparations can be used during pregnancy.  Check your medication to see what is safe to use.  It is important to use a stool softener or fiber in your diet and to drink lots of liquids.  If hemorrhoids seem to be getting worse please call the office.   Hot Tubs:  Hot tubs Jacuzzis and saunas are not recommended while pregnant.  These increase your internal body temperature and should be avoided.  Intercourse:  Sexual intercourse is safe during pregnancy as long as you are comfortable, unless otherwise advised by your provider.  Spotting may occur after intercourse; report any bright red bleeding that is heavier than spotting.  Labor:  If you know that you are in labor, please go to the hospital.  If you are unsure, please call the office and let us help you decide what to do.  Lifting, straining, etc:  If your job requires heavy   lifting or straining please check with your provider for any limitations.  Generally, you should not lift items heavier than that you can lift simply with your hands and arms (no back muscles)  Painting:  Paint fumes do not harm your pregnancy, but may make you ill and should be avoided if possible.  Latex or water based paints have less odor than oils.  Use adequate ventilation while painting.  Permanents & Hair Color:  Chemicals in hair dyes are not recommended as they cause increase hair dryness which can increase hair loss during pregnancy.  " Highlighting" and permanents are allowed.  Dye may be  absorbed differently and permanents may not hold as well during pregnancy.  Sunbathing:  Use a sunscreen, as skin burns easily during pregnancy.  Drink plenty of fluids; avoid over heating.  Tanning Beds:  Because their possible side effects are still unknown, tanning beds are not recommended.  Ultrasound Scans:  Routine ultrasounds are performed at approximately 20 weeks.  You will be able to see your baby's general anatomy an if you would like to know the gender this can usually be determined as well.  If it is questionable when you conceived you may also receive an ultrasound early in your pregnancy for dating purposes.  Otherwise ultrasound exams are not routinely performed unless there is a medical necessity.  Although you can request a scan we ask that you pay for it when conducted because insurance does not cover " patient request" scans.  Work: If your pregnancy proceeds without complications you may work until your due date, unless your physician or employer advises otherwise.  Round Ligament Pain/Pelvic Discomfort:  Sharp, shooting pains not associated with bleeding are fairly common, usually occurring in the second trimester of pregnancy.  They tend to be worse when standing up or when you remain standing for long periods of time.  These are the result of pressure of certain pelvic ligaments called "round ligaments".  Rest, Tylenol and heat seem to be the most effective relief.  As the womb and fetus grow, they rise out of the pelvis and the discomfort improves.  Please notify the office if your pain seems different than that described.  It may represent a more serious condition.  Sickle Cell Anemia, Adult Sickle cell anemia is a condition where your red blood cells are shaped like sickles. Red blood cells carry oxygen through the body. Sickle-shaped cells do not live as long as normal red blood cells. They also clump together and block blood from flowing through the blood vessels. This  prevents the body from getting enough oxygen. Sickle cell anemia causes organ damage and pain. It also increases the risk of infection. Follow these instructions at home: Medicines  Take over-the-counter and prescription medicines only as told by your doctor.  If you were prescribed an antibiotic medicine, take it as told by your doctor. Do not stop taking the antibiotic even if you start to feel better.  If you develop a fever, do not take medicines to lower the fever right away. Tell your doctor about the fever. Managing pain, stiffness, and swelling  Try these methods to help with pain: ? Use a heating pad. ? Take a warm bath. ? Distract yourself, such as by watching TV. Eating and drinking  Drink enough fluid to keep your pee (urine) clear or pale yellow. Drink more in hot weather and during exercise.  Limit or avoid alcohol.  Eat a healthy diet. Eat plenty of fruits, vegetables,  whole grains, and lean protein.  Take vitamins and supplements as told by your doctor. Traveling  When traveling, keep these with you: ? Your medical information. ? The names of your doctors. ? Your medicines.  If you need to take an airplane, talk to your doctor first. Activity  Rest often.  Avoid exercises that make your heart beat much faster, such as jogging. General instructions  Do not use products that have nicotine or tobacco, such as cigarettes and e-cigarettes. If you need help quitting, ask your doctor.  Consider wearing a medical alert bracelet.  Avoid being in high places (high altitudes), such as mountains.  Avoid very hot or cold temperatures.  Avoid places where the temperature changes a lot.  Keep all follow-up visits as told by your doctor. This is important. Contact a doctor if:  A joint hurts.  Your feet or hands hurt or swell.  You feel tired (fatigued). Get help right away if:  You have symptoms of infection. These include: ? Fever. ? Chills. ? Being  very tired. ? Irritability. ? Poor eating. ? Throwing up (vomiting).  You feel dizzy or faint.  You have new stomach pain, especially on the left side.  You have a an erection (priapism) that lasts more than 4 hours.  You have numbness in your arms or legs.  You have a hard time moving your arms or legs.  You have trouble talking.  You have pain that does not go away when you take medicine.  You are short of breath.  You are breathing fast.  You have a long-term cough.  You have pain in your chest.  You have a bad headache.  You have a stiff neck.  Your stomach looks bloated even though you did not eat much.  Your skin is pale.  You suddenly cannot see well. Summary  Sickle cell anemia is a condition where your red blood cells are shaped like sickles.  Follow your doctor's advice on ways to manage pain, food to eat, activities to do, and steps to take for safe travel.  Get medical help right away if you have any signs of infection, such as a fever. This information is not intended to replace advice given to you by your health care provider. Make sure you discuss any questions you have with your health care provider. Document Released: 08/08/2013 Document Revised: 11/23/2016 Document Reviewed: 11/23/2016 Elsevier Interactive Patient Education  2019 [employer].

## 2018-11-02 NOTE — Progress Notes (Signed)
[facility] INTERNAL MEDICINE AND SICKLE CELL CARE  SICKLE CELL ANEMIA FOLLOW UP VISIT PROVIDER: Lanae Boast, FNP    Subjective:   Carmen [name]  is a 28 y.o.  female who  has a past medical history of Abortion, Headache(784.0), and Sickle cell crisis ([place]). presents for a follow up for Sickle Cell Anemia. Patient states that she is having increased pain and has been taking her Oxycontin 30 mg ER more frequently and is running out. Patient states that she is also taking oxycodone 5 mg IR q 4 h.   The patient has had 2 admissions in the past 6 months.  Pain regimen includes: oxycontin 30 mg q12 h and oxycodone 5mg  Q4 H prn. Patient would like to have an increase back to 60 mg of MS Contin due to pain in legs and back. Patient is currently working at a [employer] and is on her feet for long periods of time. Also [redacted] weeks pregnant.    Pain today is 6/10.  The patient reports adequate daily hydration.  Recently see by OBGYN for high risk pregnancy.     Review of Systems  Constitutional: Negative.   HENT: Negative.   Eyes: Negative.   Respiratory: Negative.   Cardiovascular: Negative.   Gastrointestinal: Negative.   Genitourinary: Negative.   Musculoskeletal: Positive for back pain, joint pain and myalgias.  Skin: Negative.   Neurological: Negative.   Psychiatric/Behavioral: Negative.     Objective:   Objective  BP 115/66 (BP Location: Left Arm, Patient Position: Sitting, Cuff Size: Normal)   Pulse 99   Temp 98.1 F (36.7 C) (Oral)   Resp 16   Ht 5\' 3"  (1.6 m)   Wt 162 lb (73.5 kg)   LMP 05/15/2018 (Approximate)   SpO2 98%   BMI 28.70 kg/m   Wt Readings from Last 3 Encounters:  11/02/18 162 lb (73.5 kg)  10/18/18 156 lb 8 oz (71 kg)  10/09/18 158 lb 14.4 oz (72.1 kg)     Physical Exam Vitals signs and nursing note reviewed.  Constitutional:      General: She is not in acute distress.    Appearance: She is well-developed.  HENT:     Head:  Normocephalic and atraumatic.  Eyes:     Conjunctiva/sclera: Conjunctivae normal.     Pupils: Pupils are equal, round, and reactive to light.  Neck:     Musculoskeletal: Normal range of motion.  Cardiovascular:     Rate and Rhythm: Normal rate and regular rhythm.     Heart sounds: Normal heart sounds.  Pulmonary:     Effort: Pulmonary effort is normal. No respiratory distress.     Breath sounds: Normal breath sounds.  Musculoskeletal: Normal range of motion.        General: Tenderness (lower back) present.  Skin:    General: Skin is warm and dry.  Neurological:     Mental Status: She is alert and oriented to person, place, and time.  Psychiatric:        Behavior: Behavior normal.        Thought Content: Thought content normal.      Assessment/Plan:   Assessment   Encounter Diagnosis  Name Primary?  Marland Kitchen Hb-SS disease without crisis ([place]) Yes     Plan  1. Hb-SS disease without crisis [facility]) Discussed benefits and risks of opiate drug use during pregnancy. Changed OxyContin to MS Contin 30 mg TID PRN. Continue with 29m IR.  - Urinalysis Dipstick  2. Chronic prescription opiate use 3. High risk pregnancy, antepartum Discussed risks of opioid use during pregnancy Taking narcotics regularly during pregnancy can cause temporary withdrawal symptoms when a baby is born, a condition known as neonatal abstinence syndrome (NAS).patient is aware of potential problem.      Return to care as scheduled and prn. Patient verbalized understanding and agreed with plan of care.   1. Sickle cell disease -   We discussed the need for good hydration, monitoring of hydration status, avoidance of heat, cold, stress, and infection triggers. We discussed the risks and benefits of Hydrea, including bone marrow suppression, the possibility of GI upset, skin ulcers, hair thinning, and teratogenicity. The patient was reminded of the need to seek medical attention of any symptoms of bleeding, anemia, or  infection. Continue folic acid 1 mg daily to prevent aplastic bone marrow crises.   2. Pulmonary evaluation - Patient denies severe recurrent wheezes, shortness of breath with exercise, or persistent cough. If these symptoms develop, pulmonary function tests with spirometry will be ordered, and if abnormal, plan on referral to Pulmonology for further evaluation.  3. Cardiac - Routine screening for pulmonary hypertension is not recommended.  4. Eye - High risk of proliferative retinopathy. Annual eye exam with retinal exam recommended to patient.  5. Immunization status -  Yearly influenza vaccination is recommended, as well as being up to date with Meningococcal and Pneumococcal vaccines.   6. Acute and chronic painful episodes - We discussed that pt is to receive Schedule II prescriptions only from [place]. Pt is also aware that the prescription history is available to [place] online through the [facility] CSRS. Controlled substance agreement signed. We reminded SHAGUN WORDELL that all patients receiving Schedule II narcotics must be seen for follow within one month of prescription being requested. We reviewed the terms of our pain agreement, including the need to keep medicines in a safe locked location away from children or pets, and the need to report excess sedation or constipation, measures to avoid constipation, and policies related to early refills and stolen prescriptions. According to the [place] Chronic [facility] Initiative program, we have reviewed details related to analgesia, adverse effects, aberrant behaviors.  7. Iron overload from chronic transfusion.  Not applicable at this time.  If this occurs will use Exjade for management.   8. Vitamin D deficiency - Drisdol 50,000 units weekly. Patient encouraged to take as prescribed.   The above recommendations are taken from the NIH Evidence-Based Management of Sickle Cell Disease: Expert Panel Report, 20149.   [name], FNP-BC [facility] [address] [place], [place] [address] [phone]  This note has been created with Dragon speech recognition software and smart phrase technology. Any transcriptional errors are unintentional.

## 2018-11-03 ENCOUNTER — Other Ambulatory Visit: Payer: Self-pay

## 2018-11-03 ENCOUNTER — Encounter (HOSPITAL_COMMUNITY): Payer: Self-pay

## 2018-11-03 ENCOUNTER — Ambulatory Visit (HOSPITAL_BASED_OUTPATIENT_CLINIC_OR_DEPARTMENT_OTHER)
Admission: RE | Admit: 2018-11-03 | Discharge: 2018-11-03 | Disposition: A | Discharge status: Home / Self Care | Payer: Medicaid Other | Source: Ambulatory Visit | Attending: Obstetrics and Gynecology | Admitting: Obstetrics and Gynecology

## 2018-11-03 DIAGNOSIS — Z79891 Long term (current) use of opiate analgesic: Secondary | ICD-10-CM

## 2018-11-03 DIAGNOSIS — D571 Sickle-cell disease without crisis: Secondary | ICD-10-CM

## 2018-11-03 DIAGNOSIS — Z7982 Long term (current) use of aspirin: Secondary | ICD-10-CM

## 2018-11-03 DIAGNOSIS — O99322 Drug use complicating pregnancy, second trimester: Secondary | ICD-10-CM

## 2018-11-03 DIAGNOSIS — Z362 Encounter for other antenatal screening follow-up: Secondary | ICD-10-CM | POA: Diagnosis not present

## 2018-11-03 DIAGNOSIS — O99012 Anemia complicating pregnancy, second trimester: Principal | ICD-10-CM | POA: Diagnosis present

## 2018-11-03 DIAGNOSIS — D5700 Hb-SS disease with crisis, unspecified: Secondary | ICD-10-CM | POA: Diagnosis present

## 2018-11-03 DIAGNOSIS — O0990 Supervision of high risk pregnancy, unspecified, unspecified trimester: Secondary | ICD-10-CM

## 2018-11-03 DIAGNOSIS — Z3A24 24 weeks gestation of pregnancy: Secondary | ICD-10-CM

## 2018-11-03 DIAGNOSIS — Z832 Family history of diseases of the blood and blood-forming organs and certain disorders involving the immune mechanism: Secondary | ICD-10-CM

## 2018-11-03 NOTE — ED Triage Notes (Signed)
Pt reports SS pain in her legs and arms over the last week. She is [redacted] weeks pregnant and saw her OB today, who told her that everything is normal. No OB complaints today. A&Ox4. Ambulatory.

## 2018-11-03 NOTE — ED Notes (Signed)
Patient refused lab draw @ this time.

## 2018-11-04 ENCOUNTER — Inpatient Hospital Stay (HOSPITAL_COMMUNITY)
Admission: EM | Admit: 2018-11-04 | Discharge: 2018-11-10 | DRG: 831 | Disposition: A | Discharge status: Home / Self Care | Payer: Medicaid Other | Attending: Internal Medicine | Admitting: Internal Medicine

## 2018-11-04 DIAGNOSIS — M79609 Pain in unspecified limb: Secondary | ICD-10-CM | POA: Diagnosis not present

## 2018-11-04 DIAGNOSIS — D5700 Hb-SS disease with crisis, unspecified: Secondary | ICD-10-CM | POA: Diagnosis present

## 2018-11-04 DIAGNOSIS — O0990 Supervision of high risk pregnancy, unspecified, unspecified trimester: Secondary | ICD-10-CM | POA: Diagnosis not present

## 2018-11-04 DIAGNOSIS — D638 Anemia in other chronic diseases classified elsewhere: Secondary | ICD-10-CM | POA: Diagnosis not present

## 2018-11-04 DIAGNOSIS — Z7982 Long term (current) use of aspirin: Secondary | ICD-10-CM | POA: Diagnosis not present

## 2018-11-04 DIAGNOSIS — Z3A24 24 weeks gestation of pregnancy: Secondary | ICD-10-CM | POA: Diagnosis not present

## 2018-11-04 DIAGNOSIS — Z832 Family history of diseases of the blood and blood-forming organs and certain disorders involving the immune mechanism: Secondary | ICD-10-CM | POA: Diagnosis not present

## 2018-11-04 DIAGNOSIS — O99012 Anemia complicating pregnancy, second trimester: Secondary | ICD-10-CM | POA: Diagnosis present

## 2018-11-04 DIAGNOSIS — Z79891 Long term (current) use of opiate analgesic: Secondary | ICD-10-CM | POA: Diagnosis not present

## 2018-11-04 LAB — COMPREHENSIVE METABOLIC PANEL
ALK PHOS: 89 U/L (ref 38–126)
ALT: 30 U/L (ref 0–44)
AST: 45 U/L — ABNORMAL HIGH (ref 15–41)
Albumin: 3.1 g/dL — ABNORMAL LOW (ref 3.5–5.0)
Anion gap: 11 (ref 5–15)
BUN: 9 mg/dL (ref 6–20)
CO2: 19 mmol/L — ABNORMAL LOW (ref 22–32)
Calcium: 8.8 mg/dL — ABNORMAL LOW (ref 8.9–10.3)
Chloride: 107 mmol/L (ref 98–111)
Creatinine, Ser: 0.49 mg/dL (ref 0.44–1.00)
GFR calc Af Amer: >60 mL/min (ref >60)
GFR calc non Af Amer: >60 mL/min (ref >60)
Glucose, Bld: 77 mg/dL (ref 70–99)
POTASSIUM: 4.2 mmol/L (ref 3.5–5.1)
SODIUM: 137 mmol/L (ref 135–145)
Total Bilirubin: 0.7 mg/dL (ref 0.3–1.2)
Total Protein: 7.4 g/dL (ref 6.5–8.1)

## 2018-11-04 LAB — DIFFERENTIAL
Abs Immature Granulocytes: 0.24 10*3/uL — ABNORMAL HIGH (ref 0.00–0.07)
Basophils Absolute: 0 10*3/uL (ref 0.0–0.1)
Basophils Relative: 0 %
Eosinophils Absolute: 0.1 10*3/uL (ref 0.0–0.5)
Eosinophils Relative: 1 %
IMMATURE GRANULOCYTES: 2 %
Lymphocytes Relative: 17 %
Lymphs Abs: 2.2 10*3/uL (ref 0.7–4.0)
Monocytes Absolute: 1.5 10*3/uL — ABNORMAL HIGH (ref 0.1–1.0)
Monocytes Relative: 11 %
NEUTROS ABS: 9.3 10*3/uL — AB (ref 1.7–7.7)
Neutrophils Relative %: 69 %

## 2018-11-04 LAB — CBC
HCT: 19.4 % — ABNORMAL LOW (ref 36.0–46.0)
Hemoglobin: 6.9 g/dL — CL (ref 12.0–15.0)
MCH: 31.8 pg (ref 26.0–34.0)
MCHC: 35.6 g/dL (ref 30.0–36.0)
MCV: 89.4 fL (ref 80.0–100.0)
NRBC: 1.2 % — AB (ref 0.0–0.2)
Platelets: 571 10*3/uL — ABNORMAL HIGH (ref 150–400)
RBC: 2.17 MIL/uL — ABNORMAL LOW (ref 3.87–5.11)
RDW: 14 % (ref 11.5–15.5)
WBC: 13.4 10*3/uL — ABNORMAL HIGH (ref 4.0–10.5)

## 2018-11-04 LAB — RETICULOCYTES
Immature Retic Fract: 39.3 % — ABNORMAL HIGH (ref 2.3–15.9)
RBC.: 2.17 MIL/uL — ABNORMAL LOW (ref 3.87–5.11)
Retic Count, Absolute: 170.6 10*3/uL (ref 19.0–186.0)
Retic Ct Pct: 7.9 % — ABNORMAL HIGH (ref 0.4–3.1)

## 2018-11-04 LAB — HCG, QUANTITATIVE, PREGNANCY: hCG, Beta Chain, Quant, S: 21401 m[IU]/mL — ABNORMAL HIGH (ref <5)

## 2018-11-04 MED ORDER — POLYETHYLENE GLYCOL 3350 17 G PO PACK: 17.0000 g | PACK | Freq: Every day | ORAL | Status: DC | PRN

## 2018-11-04 MED ORDER — ENOXAPARIN SODIUM 40 MG/0.4ML ~~LOC~~ SOLN
40.0000 mg | SUBCUTANEOUS | Status: DC
  Filled 2018-11-04: qty 0.4

## 2018-11-04 MED ORDER — SODIUM CHLORIDE 0.9 % IV SOLN
25.0000 mg | INTRAVENOUS | Status: DC | PRN
  Filled 2018-11-04: qty 0.5

## 2018-11-04 MED ORDER — ONDANSETRON HCL 4 MG/2ML IJ SOLN
4.0000 mg | Freq: Four times a day (QID) | INTRAMUSCULAR | Status: DC | PRN
  Administered 2018-11-05 – 2018-11-06 (×3): 4 mg via INTRAVENOUS
  Filled 2018-11-04 (×3): qty 2

## 2018-11-04 MED ORDER — VITAMIN D (ERGOCALCIFEROL) 1.25 MG (50000 UNIT) PO CAPS
50000.0000 [IU] | ORAL_CAPSULE | ORAL | Status: DC
  Administered 2018-11-04: 50000 [IU] via ORAL
  Filled 2018-11-04: qty 1

## 2018-11-04 MED ORDER — DIPHENHYDRAMINE HCL 25 MG PO CAPS
25.0000 mg | ORAL_CAPSULE | ORAL | Status: DC | PRN
  Administered 2018-11-06: 25 mg via ORAL
  Filled 2018-11-04: qty 1

## 2018-11-04 MED ORDER — ASPIRIN EC 81 MG PO TBEC
81.0000 mg | DELAYED_RELEASE_TABLET | Freq: Every day | ORAL | Status: DC
  Administered 2018-11-04 – 2018-11-10 (×7): 81 mg via ORAL
  Filled 2018-11-04 (×7): qty 1

## 2018-11-04 MED ORDER — HYDROMORPHONE HCL 2 MG/ML IJ SOLN
2.0000 mg | INTRAMUSCULAR | Status: AC | PRN
  Administered 2018-11-04 (×3): 2 mg via INTRAVENOUS
  Filled 2018-11-04 (×3): qty 1

## 2018-11-04 MED ORDER — DIPHENHYDRAMINE HCL 50 MG/ML IJ SOLN
12.5000 mg | Freq: Once | INTRAMUSCULAR | Status: AC | PRN
  Administered 2018-11-04: 12.5 mg via INTRAVENOUS
  Filled 2018-11-04: qty 1

## 2018-11-04 MED ORDER — PRENATAL MULTIVITAMIN CH
1.0000 | ORAL_TABLET | Freq: Every day | ORAL | Status: DC
  Administered 2018-11-04 – 2018-11-10 (×7): 1 via ORAL
  Filled 2018-11-04 (×7): qty 1

## 2018-11-04 MED ORDER — FOLIC ACID 1 MG PO TABS
1.0000 mg | ORAL_TABLET | Freq: Every day | ORAL | Status: DC
  Administered 2018-11-04 – 2018-11-10 (×7): 1 mg via ORAL
  Filled 2018-11-04 (×7): qty 1

## 2018-11-04 MED ORDER — MORPHINE SULFATE ER 30 MG PO TBCR
30.0000 mg | EXTENDED_RELEASE_TABLET | Freq: Once | ORAL | Status: AC
  Administered 2018-11-04: 30 mg via ORAL
  Filled 2018-11-04: qty 2

## 2018-11-04 MED ORDER — HYDROMORPHONE 1 MG/ML IV SOLN
INTRAVENOUS | Status: DC
  Administered 2018-11-04: 30 mg via INTRAVENOUS
  Administered 2018-11-04 (×2): 2.5 mg via INTRAVENOUS
  Administered 2018-11-05: 5 mg via INTRAVENOUS
  Administered 2018-11-05: 2.5 mg via INTRAVENOUS
  Administered 2018-11-05: 4.5 mg via INTRAVENOUS
  Administered 2018-11-05: 0 mg via INTRAVENOUS
  Administered 2018-11-05: 3 mg via INTRAVENOUS
  Administered 2018-11-05: 1.5 mg via INTRAVENOUS
  Administered 2018-11-05: 5 mg via INTRAVENOUS
  Administered 2018-11-06: 3.5 mg via INTRAVENOUS
  Administered 2018-11-06: 4.5 mg via INTRAVENOUS
  Administered 2018-11-06: 30 mg via INTRAVENOUS
  Administered 2018-11-06: 4.5 mg via INTRAVENOUS
  Filled 2018-11-04 (×2): qty 30

## 2018-11-04 MED ORDER — NALOXONE HCL 0.4 MG/ML IJ SOLN: 0.4000 mg | INTRAMUSCULAR | Status: DC | PRN

## 2018-11-04 MED ORDER — SODIUM CHLORIDE 0.9% FLUSH: 9.0000 mL | INTRAVENOUS | Status: DC | PRN

## 2018-11-04 MED ORDER — KCL IN DEXTROSE-NACL 20-5-0.45 MEQ/L-%-% IV SOLN
INTRAVENOUS | Status: DC
  Administered 2018-11-04: 04:00:00 via INTRAVENOUS
  Filled 2018-11-04 (×2): qty 1000

## 2018-11-04 MED ORDER — DEXTROSE-NACL 5-0.45 % IV SOLN
INTRAVENOUS | Status: DC
  Administered 2018-11-04 – 2018-11-09 (×7): via INTRAVENOUS

## 2018-11-04 MED ORDER — SENNOSIDES-DOCUSATE SODIUM 8.6-50 MG PO TABS
1.0000 | ORAL_TABLET | Freq: Two times a day (BID) | ORAL | Status: DC
  Administered 2018-11-04 – 2018-11-10 (×13): 1 via ORAL
  Filled 2018-11-04 (×13): qty 1

## 2018-11-04 NOTE — ED Notes (Signed)
Lab value called HGB 6.9

## 2018-11-04 NOTE — ED Notes (Signed)
RN offered pt next dose of dilaudid. Pt stated she did not want another IM injection saying "it is making it worse getting poked poked poked. I want to wait until after 7 for another IV team to try and stick me."

## 2018-11-04 NOTE — H&P (Signed)
Carmen [name] is an 28 y.o. female.    Chief Complaint: Pain in arms and legs  HPI: A 28 yo who is in 2nd Trimester pregnancy admitted with pain in her legs and arms. Pain is at 10/10 consistent with her sickle cell crisis. She has been switched to MS contin as her Long acting medications. She has been unable to get it awaiting Prior Authorizations. She has been in the Er and received up to 6 mg of Dilaudid but not getting better. She is being admitted for treatment.  Past Medical History:  Diagnosis Date  . Abortion    05/2012  . Headache(784.0)   . Sickle cell crisis [facility])     Past Surgical History:  Procedure Laterality Date  . CHOLECYSTECTOMY N/A 11/30/2014   Procedure: LAPAROSCOPIC CHOLECYSTECTOMY SINGLE SITE WITH INTRAOPERATIVE CHOLANGIOGRAM;  Surgeon: Michael Boston, MD;  Location: [facility];  Service: General;  Laterality: N/A;  . SPLENECTOMY      Family History  Problem Relation Age of Onset  . Hypertension Mother   . Sickle cell anemia Sister   . Kidney disease Sister        Lupus  . Arthritis Sister   . Sickle cell anemia Sister   . Sickle cell trait Sister   . Heart disease Maternal Aunt        CABG  . Heart disease Maternal Uncle        CABG  . Lupus Sister    Social History:  reports that she has never smoked. She has never used smokeless tobacco. She reports that she does not drink alcohol or use drugs.  Allergies: No Known Allergies  (Not in a hospital admission)   Results for orders placed or performed during the hospital encounter of 11/04/18 (from the past 48 hour(s))  hCG, quantitative, pregnancy     Status: Abnormal   Collection Time: 11/04/18  4:15 AM  Result Value Ref Range   hCG, Beta Chain, Quant, S 21,401 (H) <5 mIU/mL    Comment:          GEST. AGE      CONC.  (mIU/mL)   <=1 WEEK        5 - 50     2 WEEKS       50 - 500     3 WEEKS       100 - 10,000     4 WEEKS     1,000 - 30,000     5 WEEKS     3,500 - 115,000   6-8 WEEKS     12,000 -  270,000    12 WEEKS     15,000 - 220,000        FEMALE AND NON-PREGNANT FEMALE:     LESS THAN 5 mIU/mL Performed at [facility], [place] [address] [place], [place] [address]   CBC     Status: Abnormal   Collection Time: 11/04/18  4:15 AM  Result Value Ref Range   WBC 13.4 (H) 4.0 - 10.5 K/uL   RBC 2.17 (L) 3.87 - 5.11 MIL/uL   Hemoglobin 6.9 (LL) 12.0 - 15.0 g/dL    Comment: This critical result has verified and been called to [facility] by Sandi Mealy on 01 04 2020 at 0906, and has been read back. CRITICAL RESULTS VERIFIED   HCT 19.4 (L) 36.0 - 46.0 %   MCV 89.4 80.0 - 100.0 fL   MCH 31.8 26.0 - 34.0 pg   MCHC  35.6 30.0 - 36.0 g/dL   RDW 14.0 11.5 - 15.5 %   Platelets 571 (H) 150 - 400 K/uL   nRBC 1.2 (H) 0.0 - 0.2 %    Comment: Performed at [facility], [place] [address] [place], [place] [address]  Differential     Status: Abnormal   Collection Time: 11/04/18  4:15 AM  Result Value Ref Range   Neutrophils Relative % 69 %   Neutro Abs 9.3 (H) 1.7 - 7.7 K/uL   Lymphocytes Relative 17 %   Lymphs Abs 2.2 0.7 - 4.0 K/uL   Monocytes Relative 11 %   Monocytes Absolute 1.5 (H) 0.1 - 1.0 K/uL   Eosinophils Relative 1 %   Eosinophils Absolute 0.1 0.0 - 0.5 K/uL   Basophils Relative 0 %   Basophils Absolute 0.0 0.0 - 0.1 K/uL   Immature Granulocytes 2 %   Abs Immature Granulocytes 0.24 (H) 0.00 - 0.07 K/uL    Comment: Performed at [facility], [place] [address] [place], [place] [address]  Reticulocytes     Status: Abnormal   Collection Time: 11/04/18  4:15 AM  Result Value Ref Range   Retic Ct Pct 7.9 (H) 0.4 - 3.1 %   RBC. 2.17 (L) 3.87 - 5.11 MIL/uL   Retic Count, Absolute 170.6 19.0 - 186.0 K/uL   Immature Retic Fract 39.3 (H) 2.3 - 15.9 %    Comment: Performed at [facility], [place] [address], [place], [place] [address]  Comprehensive metabolic panel     Status: Abnormal   Collection Time: 11/04/18   4:15 AM  Result Value Ref Range   Sodium 137 135 - 145 mmol/L   Potassium 4.2 3.5 - 5.1 mmol/L   Chloride 107 98 - 111 mmol/L   CO2 19 (L) 22 - 32 mmol/L   Glucose, Bld 77 70 - 99 mg/dL   BUN 9 6 - 20 mg/dL   Creatinine, Ser 0.49 0.44 - 1.00 mg/dL   Calcium 8.8 (L) 8.9 - 10.3 mg/dL   Total Protein 7.4 6.5 - 8.1 g/dL   Albumin 3.1 (L) 3.5 - 5.0 g/dL   AST 45 (H) 15 - 41 U/L   ALT 30 0 - 44 U/L   Alkaline Phosphatase 89 38 - 126 U/L   Total Bilirubin 0.7 0.3 - 1.2 mg/dL   GFR calc non Af Amer >60 >60 mL/min   GFR calc Af Amer >60 >60 mL/min   Anion gap 11 5 - 15    Comment: Performed at [facility], [place] [address], [place], [place] [address]   [place] Mfm Ob Follow Up  Result Date: 11/03/2018 ----------------------------------------------------------------------  OBSTETRICS REPORT                       (Signed Final 11/03/2018 04:02 pm) ---------------------------------------------------------------------- Patient Info  ID #:       [id number]                          D.O.B.:  Feb 01, 1991 (27 yrs)  Name:       Carmen [name]                  Visit Date: 11/03/2018 03:20 pm ---------------------------------------------------------------------- Performed By  Performed By:     Valda Favia          Ref. Address:     [facility]                                                             [place], [place]  Attending:        Tama High MD        Location:         [facility]  Referred By:      [facility] for                    [place] ---------------------------------------------------------------------- Orders   #   Description                          Code         Ordered By   1  [place] MFM OB FOLLOW UP                  [phone]     Tama High  ----------------------------------------------------------------------   #  Order #                    Accession #                 Episode #   1  [id number]                  [id number]                  [id number]  ---------------------------------------------------------------------- Indications   [redacted] weeks gestation of pregnancy  Z3A.24   Maternal sickle cell anemia, second trimester  O99.012, I77.8   Drug use complicating pregnancy, second        O99.322   trimester (chronic opiate use)   Encounter for other antenatal screening        Z36.2   follow-up  ---------------------------------------------------------------------- Fetal Evaluation  Num Of Fetuses:         1  Fetal Heart Rate(bpm):  155  Cardiac Activity:       Observed  Presentation:           Cephalic  Placenta:               Posterior  P. Cord Insertion:      Previously Visualized  Amniotic Fluid  AFI FV:      Within normal limits                              Largest Pocket(cm)                              4.6 ---------------------------------------------------------------------- Biometry  BPD:      59.9  mm     G. Age:  24w 3d         37  %    CI:        76.67   %    70 - 86                                                          FL/HC:      18.8   %    18.7 - 20.3  HC:      216.7  mm     G. Age:  23w 5d          9  %    HC/AC:      1.04        1.04 - 1.22  AC:       209   mm     G. Age:  25w 4d         68  %    FL/BPD:     67.9   %    71 - 87  FL:       40.7  mm     G. Age:  23w 1d          7  %    FL/AC:      19.5   %    20 - 24  HUM:      39.1  mm     G. Age:  24w 0d         27  %  Est. FW:     693  gm      1 lb 8 oz     49  % ---------------------------------------------------------------------- OB History  Gravidity:    2  TOP:          1 ----------------------------------------------------------------------  Gestational Age  LMP:           24w 4d        Date:  05/15/18                 EDD:  02/19/19  U/S Today:     24w 2d                                        EDD:   02/21/19  Best:          24w 4d     Det. By:  LMP  (05/15/18)          EDD:   02/19/19 ---------------------------------------------------------------------- Anatomy  Cranium:               Appears normal         LVOT:                   Appears normal  Cavum:                 Previously seen        Aortic Arch:            Appears normal  Ventricles:            Appears normal         Ductal Arch:            Not well visualized  Choroid Plexus:        Previously seen        Diaphragm:              Previously seen  Cerebellum:            Previously seen        Stomach:                Appears normal, left                                                                        sided  Posterior Fossa:       Previously seen        Abdomen:                Previously seen  Nuchal Fold:           Previously seen        Abdominal Wall:         Previously seen  Face:                  Orbits and profile     Cord Vessels:           Previously seen                         previously seen  Lips:                  Previously seen        Kidneys:                Appear normal  Palate:                Previously seen        Bladder:                Appears normal  Thoracic:  Appears normal         Spine:                  Previously seen  Heart:                 Appears normal         Upper Extremities:      Previously seen                         (4CH, axis, and situs  RVOT:                  Appears normal         Lower Extremities:      Previously seen  Other:  Fetus appears to be a female. Heels and 5th digit visualized previously.          Open hands visualized previously. Nasal bone visualized previously. ---------------------------------------------------------------------- Cervix Uterus Adnexa  Uterus  No abnormality visualized.  Left Ovary  No adnexal mass  visualized.  Right Ovary  No adnexal mass visualized.  Cul De Sac  No free fluid seen.  Adnexa  No abnormality visualized. ---------------------------------------------------------------------- Impression  Maternal sickle-cell disease. Patient feels well and does not  have pain now (crisis).  Fetal growth is appropriate for gestational age. Amniotic fluid  is normal and good fetal activity is seen.  Cardiac anatomy is normal (except ductal arch that could not  be visualized).  We reassured the patient of the findings. ---------------------------------------------------------------------- Recommendations  An appointment was made for her to return in 4 weeks for  fetal growth assessment. ----------------------------------------------------------------------                  Tama High, MD Electronically Signed Final Report   11/03/2018 04:02 pm ----------------------------------------------------------------------   Review of Systems  Constitutional: Negative.   HENT: Negative.   Eyes: Negative.   Respiratory: Negative.   Cardiovascular: Negative.   Gastrointestinal: Negative.   Genitourinary: Negative.   Musculoskeletal: Positive for back pain and myalgias.  Skin: Negative.   Neurological: Negative.   Endo/Heme/Allergies: Negative.   Psychiatric/Behavioral: Negative.     Blood pressure 116/78, pulse 88, temperature 98.2 F (36.8 C), temperature source Oral, resp. rate 16, last menstrual period 05/15/2018, SpO2 99 %. Physical Exam  Constitutional: She is oriented to person, place, and time. She appears well-developed and well-nourished.  HENT:  Head: Normocephalic and atraumatic.  Eyes: Pupils are equal, round, and reactive to light. Conjunctivae are normal.  Neck: Normal range of motion. Neck supple.  Cardiovascular: Normal rate, regular rhythm and normal heart sounds.  Respiratory: Effort normal and breath sounds normal.  GI: Soft. Bowel sounds are normal.  Musculoskeletal: Normal  range of motion.        General: Tenderness present.  Neurological: She is alert and oriented to person, place, and time.  Skin: Skin is warm and dry.  Psychiatric: She has a normal mood and affect.     Assessment/Plan A 28 yo admitted with sickle cell crisis who is also [redacted] weeks pregnant.  #1 sickle cell painful crisis: Patient is having pain most likely secondary to missing her long-acting pain medications.  Admitted and initiated on IV Dilaudid PCA.  Also the MS Contin.  Adjust PCA and monitor patient.  IV fluids will be added.  #2 [redacted] weeks gestation: Baby will be monitored by the OB rapid response team.  #3 sickle cell anemia: Monitor H&H closely especially  in the setting of pregnancy.  #4 chronic pain syndrome: Continue MS Contin.    Barbette Merino, MD 11/04/2018, 10:17 AM

## 2018-11-04 NOTE — ED Provider Notes (Signed)
[facility] DEPT Provider Note   CSN: [id number] Arrival date & time: 11/03/18  2110     History   Chief Complaint Chief Complaint  Patient presents with  . Sickle Cell Pain Crisis    HPI Carmen [name] is a 28 y.o. female.  HPI   27yF with pain in b/l arms and legs. Says it has been going on for about a week. She attributes it to sickle cell anemia. Taking her home meds w/o much improvement. She attributes pain to weather and pregnancy. No CP or dyspnea. No abdominal pain. Feeling baby move regularly. No fever or chills.   Past Medical History:  Diagnosis Date  . Abortion    05/2012  . Headache(784.0)   . Sickle cell crisis [facility])     Patient Active Problem List   Diagnosis Date Noted  . 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 Supervision of high risk pregnancy, antepartum 08/17/2018  . Chronic prescription opiate use 03/13/2018  . Chronic musculoskeletal pain 03/13/2018  . Vitamin D deficiency 01/13/2018  . Hypokalemia 06/27/2017  . 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[place]) 04/05/2017  . Hb-SS disease without crisis ([place]) 08/15/2016  . Anemia of chronic disease   . Sickle cell crisis ([place]) 02/28/2015  . S/P laparoscopic cholecystectomy 11/30/2014  . Thrombocytosis ([place]) 11/22/2014  . Systolic ejection murmur 24/40/1027  . Sickle cell pain crisis ([place]) 11/12/2013    Past Surgical History:  Procedure Laterality Date  . CHOLECYSTECTOMY N/A 11/30/2014   Procedure: LAPAROSCOPIC CHOLECYSTECTOMY SINGLE SITE WITH INTRAOPERATIVE CHOLANGIOGRAM;  Surgeon: Michael Boston, MD;  Location: [facility] ORS;  Service: General;  Laterality: N/A;  . SPLENECTOMY       OB History    Gravida  2   Para      Term      Preterm      AB  1   Living  0     SAB      TAB  1   Ectopic      Multiple      Live Births               Home Medications    Prior to Admission medications   Medication Sig Start Date End Date Taking?  Authorizing Provider  aspirin EC 81 MG tablet Take 1 tablet (81 mg total) by mouth daily. 08/17/18  Yes [name], MD  ergocalciferol (VITAMIN D2) 50000 units capsule Take 1 capsule (50,000 Units total) by mouth once a week. 05/22/18  Yes Tresa Garter, MD  folic acid (FOLVITE) 1 MG tablet Take 1 tablet (1 mg total) by mouth daily. 08/17/18  Yes [name], MD  morphine (MS CONTIN) 30 MG 12 hr tablet Take 1 tablet (30 mg total) by mouth every 8 (eight) hours as needed for pain. 11/02/18  Yes Lanae Boast, FNP  oxycodone (OXY-IR) 5 MG capsule Take 1 capsule (5 mg total) by mouth every 4 (four) hours as needed for up to 15 days. 10/31/18 11/15/18 Yes Azzie Glatter, FNP  Prenatal Vit-Fe Fumarate-FA (PREPLUS) 27-1 MG TABS Take 1 tablet by mouth daily. 08/17/18  Yes [name], MD  Nutritional Supplements (ENSURE HEALTHY MOM) LIQD Drink 1 pouch BID Patient not taking: Reported on 11/02/2018 10/18/18   Emily Filbert, MD    Family History Family History  Problem Relation Age of Onset  . Hypertension Mother   . Sickle cell anemia Sister   . Kidney disease  Sister        Lupus  . Arthritis Sister   . Sickle cell anemia Sister   . Sickle cell trait Sister   . Heart disease Maternal Aunt        CABG  . Heart disease Maternal Uncle        CABG  . Lupus Sister     Social History Social History   Tobacco Use  . Smoking status: Never Smoker  . Smokeless tobacco: Never Used  Substance Use Topics  . Alcohol use: No  . Drug use: No     Allergies   Patient has no known allergies.   Review of Systems Review of Systems  All systems reviewed and negative, other than as noted in HPI.  Physical Exam Updated Vital Signs BP 107/69   Pulse 85   Temp 98.2 F (36.8 C) (Oral)   Resp 18   LMP 05/15/2018 (Approximate)   SpO2 100%   Physical Exam Vitals signs and nursing note reviewed.  Constitutional:      General: She is not in acute distress.    Appearance:  She is well-developed.  HENT:     Head: Normocephalic and atraumatic.  Eyes:     General:        Right eye: No discharge.        Left eye: No discharge.     Conjunctiva/sclera: Conjunctivae normal.  Neck:     Musculoskeletal: Neck supple.  Cardiovascular:     Rate and Rhythm: Normal rate and regular rhythm.     Heart sounds: Normal heart sounds. No murmur. No friction rub. No gallop.   Pulmonary:     Effort: Pulmonary effort is normal. No respiratory distress.     Breath sounds: Normal breath sounds.  Abdominal:     General: There is no distension.     Palpations: Abdomen is soft.     Tenderness: There is no abdominal tenderness.     Comments: Gravid uterus with fundus palpable above umbilicus  Musculoskeletal:        General: No tenderness.  Skin:    General: Skin is warm and dry.  Neurological:     Mental Status: She is alert.  Psychiatric:        Behavior: Behavior normal.        Thought Content: Thought content normal.      ED Treatments / Results  Labs (all labs ordered are listed, but only abnormal results are displayed) Labs Reviewed  HCG, QUANTITATIVE, PREGNANCY - Abnormal; Notable for the following components:      Result Value   hCG, Beta Chain, Quant, S 21,401 (*)    All other components within normal limits  COMPREHENSIVE METABOLIC PANEL - Abnormal; Notable for the following components:   CO2 19 (*)    Calcium 8.8 (*)    Albumin 3.1 (*)    AST 45 (*)    All other components within normal limits  CBC WITH DIFFERENTIAL/PLATELET  CBC  DIFFERENTIAL  RETICULOCYTES    EKG None  Radiology [place] Mfm Ob Follow Up  Result Date: 11/03/2018 ----------------------------------------------------------------------  OBSTETRICS REPORT                       (Signed Final 11/03/2018 04:02 pm) ---------------------------------------------------------------------- Patient Info  ID #:       [id number]  D.O.B.:  1991-09-02 (27 yrs)  Name:       Carmen [name]  [place]                  Visit Date: 11/03/2018 03:20 pm ---------------------------------------------------------------------- Performed By  Performed By:     Valda Favia          Ref. Address:     [facility]                                                             [place]                                                             [place], [place]  Attending:        Tama High MD        Location:         [facility]  Referred By:      [facility]                    [place] ---------------------------------------------------------------------- Orders   #  Description                          Code         Ordered By   1  [place] MFM OB FOLLOW UP                  76283.15     Tama High  ----------------------------------------------------------------------   #  Order #  Accession #                 Episode #   1  [id number]                  [id number]                  [id number]  ---------------------------------------------------------------------- Indications   [redacted] weeks gestation of pregnancy                Z3A.24   Maternal sickle cell anemia, second trimester  O99.012, G18.2   Drug use complicating pregnancy, second        O99.322   trimester (chronic opiate use)   Encounter for other antenatal screening        Z36.2   follow-up  ---------------------------------------------------------------------- Fetal Evaluation  Num Of Fetuses:         1  Fetal Heart Rate(bpm):  155  Cardiac Activity:       Observed  Presentation:           Cephalic  Placenta:               Posterior  P. Cord Insertion:      Previously Visualized  Amniotic Fluid  AFI FV:      Within normal limits                               Largest Pocket(cm)                              4.6 ---------------------------------------------------------------------- Biometry  BPD:      59.9  mm     G. Age:  24w 3d         37  %    CI:        76.67   %    70 - 86                                                          FL/HC:      18.8   %    18.7 - 20.3  HC:      216.7  mm     G. Age:  23w 5d          9  %    HC/AC:      1.04        1.04 - 1.22  AC:       209   mm     G. Age:  25w 4d         68  %    FL/BPD:     67.9   %    71 - 87  FL:       40.7  mm     G. Age:  23w 1d          7  %    FL/AC:      19.5   %    20 - 24  HUM:      39.1  mm     G. Age:  24w 0d         27  %  Est. FW:  693  gm      1 lb 8 oz     49  % ---------------------------------------------------------------------- OB History  Gravidity:    2  TOP:          1 ---------------------------------------------------------------------- Gestational Age  LMP:           24w 4d        Date:  05/15/18                 EDD:   02/19/19  U/S Today:     24w 2d                                        EDD:   02/21/19  Best:          24w 4d     Det. By:  LMP  (05/15/18)          EDD:   02/19/19 ---------------------------------------------------------------------- Anatomy  Cranium:               Appears normal         LVOT:                   Appears normal  Cavum:                 Previously seen        Aortic Arch:            Appears normal  Ventricles:            Appears normal         Ductal Arch:            Not well visualized  Choroid Plexus:        Previously seen        Diaphragm:              Previously seen  Cerebellum:            Previously seen        Stomach:                Appears normal, left                                                                        sided  Posterior Fossa:       Previously seen        Abdomen:                Previously seen  Nuchal Fold:           Previously seen        Abdominal Wall:         Previously seen  Face:                  Orbits and profile      Cord Vessels:           Previously seen                         previously seen  Lips:  Previously seen        Kidneys:                Appear normal  Palate:                Previously seen        Bladder:                Appears normal  Thoracic:              Appears normal         Spine:                  Previously seen  Heart:                 Appears normal         Upper Extremities:      Previously seen                         (4CH, axis, and situs  RVOT:                  Appears normal         Lower Extremities:      Previously seen  Other:  Fetus appears to be a female. Heels and 5th digit visualized previously.          Open hands visualized previously. Nasal bone visualized previously. ---------------------------------------------------------------------- Cervix Uterus Adnexa  Uterus  No abnormality visualized.  Left Ovary  No adnexal mass visualized.  Right Ovary  No adnexal mass visualized.  Cul De Sac  No free fluid seen.  Adnexa  No abnormality visualized. ---------------------------------------------------------------------- Impression  Maternal sickle-cell disease. Patient feels well and does not  have pain now (crisis).  Fetal growth is appropriate for gestational age. Amniotic fluid  is normal and good fetal activity is seen.  Cardiac anatomy is normal (except ductal arch that could not  be visualized).  We reassured the patient of the findings. ---------------------------------------------------------------------- Recommendations  An appointment was made for her to return in 4 weeks for  fetal growth assessment. ----------------------------------------------------------------------                  Tama High, MD Electronically Signed Final Report   11/03/2018 04:02 pm ----------------------------------------------------------------------   Procedures Procedures (including critical care time)  Medications Ordered in ED Medications  HYDROmorphone (DILAUDID) injection 2 mg (2 mg  Intravenous Given 11/04/18 0508)  dextrose 5 % and 0.45 % NaCl with KCl 20 mEq/L infusion ( Intravenous Hold 11/04/18 0421)  diphenhydrAMINE (BENADRYL) injection 12.5 mg (12.5 mg Intravenous Given 11/04/18 0333)     Initial Impression / Assessment and Plan / ED Course  I have reviewed the triage vital signs and the nursing notes.  Pertinent labs & imaging results that were available during my care of the patient were reviewed by me and considered in my medical decision making (see chart for details).     27yF with extremity pain. She attributes to sickle cell. Exam is reassuring. HD stable. I doubt emergent complication. Will check labs and treat symptoms. Reassess.   Final Clinical Impressions(s) / ED Diagnoses   Final diagnoses:  Pain in extremity, unspecified extremity  Sickle cell anemia with pain [facility])    ED Discharge Orders    None       Virgel Manifold, MD 11/04/18 [phone]

## 2018-11-04 NOTE — ED Notes (Signed)
Lab contacted to come collect blood due to pt being a difficult stick and blood clotting.

## 2018-11-04 NOTE — ED Notes (Signed)
MD verbalized IM instead of IV due to infiltration of previous IV

## 2018-11-04 NOTE — ED Notes (Signed)
Iv team at bedside  

## 2018-11-04 NOTE — ED Notes (Signed)
IV team unable to start IV due do sticking pt twice already. Advised to ask MD

## 2018-11-04 NOTE — ED Notes (Signed)
ED TO INPATIENT HANDOFF REPORT  Name/Age/Gender Carmen [name] 28 y.o. female  Code Status Code Status History    Date Active Date Inactive Code Status Order ID Comments User Context   10/07/2018 1522 10/13/2018 2116 Full Code [id number]  Elwyn Reach, MD ED   08/25/2017 0200 08/30/2017 1810 Full Code [id number]  Norval Morton, MD ED   08/21/2017 0232 08/23/2017 1549 Full Code [id number]  Ivor Costa, MD ED   07/17/2017 1314 07/20/2017 1853 Full Code [id number]  Elwyn Reach, MD Inpatient   06/27/2017 0610 06/28/2017 1931 Full Code [id number]  Rise Patience, MD ED   06/25/2017 1408 06/25/2017 2232 Full Code [id number]  Leana Gamer, MD Inpatient   06/21/2017 0019 06/24/2017 2204 Full Code [id number]  Etta Quill, DO ED   05/29/2017 0147 05/31/2017 1353 Full Code [id number]  Etta Quill, DO ED   05/23/2017 0336 05/24/2017 1817 Full Code [id number]  Vianne Bulls, MD ED   05/18/2017 0211 05/22/2017 1416 Full Code [id number]  Reubin Milan, MD Inpatient   05/18/2017 0211 05/18/2017 0211 Full Code [id number]  Reubin Milan, MD Inpatient   03/23/2017 0613 03/25/2017 1907 Full Code [id number]  Norval Morton, MD ED   03/19/2017 0456 03/19/2017 1534 Full Code [id number]  Lily Kocher, MD ED   01/19/2017 2128 01/25/2017 2104 Full Code [id number]  Annita Brod, MD Inpatient   12/24/2016 0933 12/28/2016 1853 Full Code [id number]  Tresa Garter, MD Inpatient   11/30/2016 1020 11/30/2016 2123 Full Code [id number]  Tresa Garter, MD Inpatient   08/30/2016 0503 09/05/2016 1511 Full Code [id number]  Norval Morton, MD ED   08/16/2016 0049 08/23/2016 2056 Full Code [id number]  Edwin Dada, MD ED   06/14/2016 1655 06/19/2016 1703 Full Code [id number]  Elwyn Reach, MD Inpatient   05/14/2016 2103 05/22/2016 2028 Full Code [id number]  Louellen Molder, MD Inpatient   04/19/2016 1519 04/26/2016 2049 Full Code [id number]  Leana Gamer, MD Inpatient   02/10/2016  2022 02/15/2016 1859 Full Code [id number]  Vianne Bulls, MD ED   12/19/2015 1324 12/25/2015 2045 Full Code [id number]  Elwyn Reach, MD Inpatient   12/19/2015 0746 12/19/2015 1324 Full Code [id number]  Reyne Dumas, MD ED   11/11/2015 0409 11/17/2015 2103 Full Code [id number]  Toy Baker, MD ED   10/17/2015 1248 10/24/2015 2031 Full Code [id number]  Leana Gamer, MD ED   10/07/2015 1809 10/14/2015 2058 Full Code [id number]  Robbie Lis, MD ED   09/09/2015 0207 09/14/2015 2131 Full Code [id number]  Edwin Dada, MD Inpatient   03/12/2015 0338 03/18/2015 1702 Full Code [id number]  Rise Patience, MD Inpatient   02/28/2015 2122 03/06/2015 1547 Full Code [id number]  Toy Baker, MD Inpatient   02/25/2015 1720 02/27/2015 1336 Full Code [id number]  Bonnielee Haff, MD Inpatient   02/16/2015 0043 02/22/2015 1921 Full Code [id number]  Lavina Hamman, MD ED   11/28/2014 2048 12/02/2014 1435 Full Code [id number]  Orson Eva, MD Inpatient   11/22/2014 2314 11/24/2014 1658 Full Code [id number]  Theressa Millard, MD Inpatient   05/16/2014 0631 05/18/2014 1649 Full Code [id number]  Rise Patience, MD Inpatient   11/12/2013 2227 11/13/2013 1851 Full Code [id number]  Annita Brod, MD Inpatient      Home/SNF/Other Home  Chief Complaint sickle cell crisis  Level of Care/Admitting Diagnosis ED Disposition    ED Disposition Condition [facility] Area: [facility]  [place] [[id number]]  Level of Care: Med-Surg [16]  Diagnosis: Sickle cell anemia with crisis [facility]) [[id number]]  Admitting Physician: Elwyn Reach [[id number]]  Attending Physician: Elwyn Reach [[id number]]  Estimated length of stay: past midnight tomorrow  Certification:: I certify this patient will need inpatient services for at least 2 midnights  PT Class (Do Not Modify): Inpatient [101]  PT Acc Code (Do Not Modify): Private [1]       Medical History Past Medical History:  Diagnosis Date   . Abortion    05/2012  . Headache(784.0)   . Sickle cell crisis ([place])     Allergies No Known Allergies  IV Location/Drains/Wounds Patient Lines/Drains/Airways Status   Active Line/Drains/Airways    Name:   Placement date:   Placement time:   Site:   Days:   Peripheral IV 11/04/18 Right;Posterior Hand   11/04/18    0752    Hand   less than 1          Labs/Imaging Results for orders placed or performed during the hospital encounter of 11/04/18 (from the past 48 hour(s))  hCG, quantitative, pregnancy     Status: Abnormal   Collection Time: 11/04/18  4:15 AM  Result Value Ref Range   hCG, Beta Chain, Quant, S 21,401 (H) <5 mIU/mL    Comment:          GEST. AGE      CONC.  (mIU/mL)   <=1 WEEK        5 - 50     2 WEEKS       50 - 500     3 WEEKS       100 - 10,000     4 WEEKS     1,000 - 30,000     5 WEEKS     3,500 - 115,000   6-8 WEEKS     12,000 - 270,000    12 WEEKS     15,000 - 220,000        FEMALE AND NON-PREGNANT FEMALE:     LESS THAN 5 mIU/mL Performed at [facility], [place] [address], [place], [place] [address]   CBC     Status: Abnormal   Collection Time: 11/04/18  4:15 AM  Result Value Ref Range   WBC 13.4 (H) 4.0 - 10.5 K/uL   RBC 2.17 (L) 3.87 - 5.11 MIL/uL   Hemoglobin 6.9 (LL) 12.0 - 15.0 g/dL    Comment: This critical result has verified and been called to [facility] by Sandi Mealy on 01 04 2020 at 0906, and has been read back. CRITICAL RESULTS VERIFIED   HCT 19.4 (L) 36.0 - 46.0 %   MCV 89.4 80.0 - 100.0 fL   MCH 31.8 26.0 - 34.0 pg   MCHC 35.6 30.0 - 36.0 g/dL   RDW 14.0 11.5 - 15.5 %   Platelets 571 (H) 150 - 400 K/uL   nRBC 1.2 (H) 0.0 - 0.2 %    Comment: Performed at [facility], [place] [address], [place], [place] [address]  Differential     Status: Abnormal   Collection Time: 11/04/18  4:15 AM  Result Value Ref Range   Neutrophils Relative % 69 %   Neutro Abs 9.3 (H) 1.7 - 7.7 K/uL   Lymphocytes  Relative 17 %   Lymphs Abs 2.2 0.7 - 4.0 K/uL   Monocytes Relative 11 %   Monocytes Absolute 1.5 (H) 0.1 - 1.0 K/uL  Eosinophils Relative 1 %   Eosinophils Absolute 0.1 0.0 - 0.5 K/uL   Basophils Relative 0 %   Basophils Absolute 0.0 0.0 - 0.1 K/uL   Immature Granulocytes 2 %   Abs Immature Granulocytes 0.24 (H) 0.00 - 0.07 K/uL    Comment: Performed at [facility], [place] [address], [place], [place] [address]  Reticulocytes     Status: Abnormal   Collection Time: 11/04/18  4:15 AM  Result Value Ref Range   Retic Ct Pct 7.9 (H) 0.4 - 3.1 %   RBC. 2.17 (L) 3.87 - 5.11 MIL/uL   Retic Count, Absolute 170.6 19.0 - 186.0 K/uL   Immature Retic Fract 39.3 (H) 2.3 - 15.9 %    Comment: Performed at [facility], [place] [address], [place], [place] [address]  Comprehensive metabolic panel     Status: Abnormal   Collection Time: 11/04/18  4:15 AM  Result Value Ref Range   Sodium 137 135 - 145 mmol/L   Potassium 4.2 3.5 - 5.1 mmol/L   Chloride 107 98 - 111 mmol/L   CO2 19 (L) 22 - 32 mmol/L   Glucose, Bld 77 70 - 99 mg/dL   BUN 9 6 - 20 mg/dL   Creatinine, Ser 0.49 0.44 - 1.00 mg/dL   Calcium 8.8 (L) 8.9 - 10.3 mg/dL   Total Protein 7.4 6.5 - 8.1 g/dL   Albumin 3.1 (L) 3.5 - 5.0 g/dL   AST 45 (H) 15 - 41 U/L   ALT 30 0 - 44 U/L   Alkaline Phosphatase 89 38 - 126 U/L   Total Bilirubin 0.7 0.3 - 1.2 mg/dL   GFR calc non Af Amer >60 >60 mL/min   GFR calc Af Amer >60 >60 mL/min   Anion gap 11 5 - 15    Comment: Performed at [facility], [place] [address], [place],  [address]   [place] Mfm Ob Follow Up  Result Date: 11/03/2018 ----------------------------------------------------------------------  OBSTETRICS REPORT                       (Signed Final 11/03/2018 04:02 pm) ---------------------------------------------------------------------- Patient Info  ID #:       [id number]                          D.O.B.:  Sep 20, 1991 (27 yrs)  Name:        Carmen [name]                  Visit Date: 11/03/2018 03:20 pm ---------------------------------------------------------------------- Performed By  Performed By:     Valda Favia          Ref. Address:     [facility]  [place], [place]  Attending:        Tama High MD        Location:         [facility]  Referred By:      [facility] for                    [place] ---------------------------------------------------------------------- Orders   #  Description                          Code         Ordered By   1  [place] MFM OB FOLLOW UP                  [phone]     Tama High  ----------------------------------------------------------------------   #  Order #                    Accession #                 Episode #   1  [id number]                  [id number]                  [id number]  ---------------------------------------------------------------------- Indications   [redacted] weeks gestation of pregnancy                Z3A.24   Maternal sickle cell anemia, second trimester  O99.012, P50.9   Drug use complicating pregnancy, second        O99.322   trimester (chronic opiate use)   Encounter for other antenatal screening        Z36.2   follow-up  ---------------------------------------------------------------------- Fetal Evaluation  Num Of Fetuses:         1  Fetal Heart Rate(bpm):  155  Cardiac Activity:       Observed  Presentation:           Cephalic  Placenta:               Posterior  P. Cord Insertion:  Previously Visualized  Amniotic Fluid  AFI FV:      Within normal limits                               Largest Pocket(cm)                              4.6 ---------------------------------------------------------------------- Biometry  BPD:      59.9  mm     G. Age:  24w 3d         37  %    CI:        76.67   %    70 - 86                                                          FL/HC:      18.8   %    18.7 - 20.3  HC:      216.7  mm     G. Age:  23w 5d          9  %    HC/AC:      1.04        1.04 - 1.22  AC:       209   mm     G. Age:  25w 4d         68  %    FL/BPD:     67.9   %    71 - 87  FL:       40.7  mm     G. Age:  23w 1d          7  %    FL/AC:      19.5   %    20 - 24  HUM:      39.1  mm     G. Age:  24w 0d         27  %  Est. FW:     693  gm      1 lb 8 oz     49  % ---------------------------------------------------------------------- OB History  Gravidity:    2  TOP:          1 ---------------------------------------------------------------------- Gestational Age  LMP:           24w 4d        Date:  05/15/18                 EDD:   02/19/19  U/S Today:     24w 2d                                        EDD:   02/21/19  Best:          24w 4d     Det. By:  LMP  (05/15/18)          EDD:   02/19/19 ---------------------------------------------------------------------- Anatomy  Cranium:               Appears normal         LVOT:  Appears normal  Cavum:                 Previously seen        Aortic Arch:            Appears normal  Ventricles:            Appears normal         Ductal Arch:            Not well visualized  Choroid Plexus:        Previously seen        Diaphragm:              Previously seen  Cerebellum:            Previously seen        Stomach:                Appears normal, left                                                                        sided  Posterior Fossa:       Previously seen        Abdomen:                Previously seen  Nuchal Fold:           Previously seen        Abdominal Wall:         Previously seen  Face:                  Orbits and  profile     Cord Vessels:           Previously seen                         previously seen  Lips:                  Previously seen        Kidneys:                Appear normal  Palate:                Previously seen        Bladder:                Appears normal  Thoracic:              Appears normal         Spine:                  Previously seen  Heart:                 Appears normal         Upper Extremities:      Previously seen                         (4CH, axis, and situs  RVOT:                  Appears normal         Lower Extremities:  Previously seen  Other:  Fetus appears to be a female. Heels and 5th digit visualized previously.          Open hands visualized previously. Nasal bone visualized previously. ---------------------------------------------------------------------- Cervix Uterus Adnexa  Uterus  No abnormality visualized.  Left Ovary  No adnexal mass visualized.  Right Ovary  No adnexal mass visualized.  Cul De Sac  No free fluid seen.  Adnexa  No abnormality visualized. ---------------------------------------------------------------------- Impression  Maternal sickle-cell disease. Patient feels well and does not  have pain now (crisis).  Fetal growth is appropriate for gestational age. Amniotic fluid  is normal and good fetal activity is seen.  Cardiac anatomy is normal (except ductal arch that could not  be visualized).  We reassured the patient of the findings. ---------------------------------------------------------------------- Recommendations  An appointment was made for her to return in 4 weeks for  fetal growth assessment. ----------------------------------------------------------------------                  Tama High, MD Electronically Signed Final Report   11/03/2018 04:02 pm ----------------------------------------------------------------------  None  Pending Labs Unresulted Labs (From admission, onward)    Start     Ordered   11/03/18 2120  CBC with Differential  ONCE -  STAT,   STAT     11/03/18 2119   Signed and Held  CBC  (enoxaparin (LOVENOX)    CrCl >/= 30 ml/min)  Once,   R    Comments:  Baseline for enoxaparin therapy IF NOT ALREADY DRAWN.  Notify MD if PLT < 100 K.    Signed and Held   Signed and Held  Creatinine, serum  (enoxaparin (LOVENOX)    CrCl >/= 30 ml/min)  Once,   R    Comments:  Baseline for enoxaparin therapy IF NOT ALREADY DRAWN.    Signed and Held   Signed and Held  Creatinine, serum  (enoxaparin (LOVENOX)    CrCl >/= 30 ml/min)  Weekly,   R    Comments:  while on enoxaparin therapy    Signed and Held   Signed and Held  CBC with Differential/Platelet  Once,   R     Signed and Held   Signed and Held  Comprehensive metabolic panel  Tomorrow morning,   R     Signed and Held          Vitals/Pain Today's Vitals   11/04/18 1100 11/04/18 1105 11/04/18 1130 11/04/18 1235  BP: 111/65 111/65 (!) 95/51 108/64  Pulse: 87 87 85 85  Resp:  16  16  Temp:      TempSrc:      SpO2: 96% 96% 97% 98%  PainSc:        Isolation Precautions No active isolations  Medications Medications  dextrose 5 % and 0.45 % NaCl with KCl 20 mEq/L infusion ( Intravenous Hold 11/04/18 0421)  HYDROmorphone (DILAUDID) injection 2 mg (2 mg Intravenous Given 11/04/18 0851)  diphenhydrAMINE (BENADRYL) injection 12.5 mg (12.5 mg Intravenous Given 11/04/18 0333)  morphine (MS CONTIN) 12 hr tablet 30 mg (30 mg Oral Given 11/04/18 1112)    Mobility walks

## 2018-11-05 LAB — COMPREHENSIVE METABOLIC PANEL
ALT: 23 U/L (ref 0–44)
AST: 30 U/L (ref 15–41)
Albumin: 3 g/dL — ABNORMAL LOW (ref 3.5–5.0)
Alkaline Phosphatase: 86 U/L (ref 38–126)
Anion gap: 8 (ref 5–15)
BUN: 9 mg/dL (ref 6–20)
CO2: 21 mmol/L — ABNORMAL LOW (ref 22–32)
CREATININE: 0.45 mg/dL (ref 0.44–1.00)
Calcium: 8.7 mg/dL — ABNORMAL LOW (ref 8.9–10.3)
Chloride: 106 mmol/L (ref 98–111)
GFR calc Af Amer: >60 mL/min (ref >60)
GFR calc non Af Amer: >60 mL/min (ref >60)
Glucose, Bld: 84 mg/dL (ref 70–99)
Potassium: 3.7 mmol/L (ref 3.5–5.1)
Sodium: 135 mmol/L (ref 135–145)
TOTAL PROTEIN: 6.4 g/dL — AB (ref 6.5–8.1)
Total Bilirubin: 1.1 mg/dL (ref 0.3–1.2)

## 2018-11-05 MED ORDER — SODIUM CHLORIDE 0.9% IV SOLUTION
Freq: Once | INTRAVENOUS | Status: AC
  Administered 2018-11-06: 03:00:00 via INTRAVENOUS

## 2018-11-05 NOTE — Progress Notes (Signed)
Subjective: 28 year old female admitted with sickle cell painful crisis.  Patient is also [redacted] weeks pregnant.  Pain is still at 9 out of 10 in back and legs.  Currently on Dilaudid PCA with MS Contin.  Not on Toradol due to pregnancy.  Objective: Vital signs in last 24 hours: Temp:  [97.9 F (36.6 C)-98.5 F (36.9 C)] 98.5 F (36.9 C) (01/05 2209) Pulse Rate:  [64-90] 70 (01/05 2209) Resp:  [11-21] 15 (01/05 2209) BP: (101-113)/(62-73) 113/69 (01/05 2209) SpO2:  [96 %-100 %] 100 % (01/05 2209) Weight change:  Last BM Date: 11/04/17  Intake/Output from previous day: 01/04 0701 - 01/05 0700 In: 2181.9 [P.O.:715; I.V.:1466.9] Out: 1300 [Urine:1300] Intake/Output this shift: No intake/output data recorded.  General appearance: alert, cooperative, appears stated age and no distress Neck: no adenopathy, no carotid bruit, no JVD, supple, symmetrical, trachea midline and thyroid not enlarged, symmetric, no tenderness/mass/nodules Back: symmetric, no curvature. ROM normal. No CVA tenderness. Resp: clear to auscultation bilaterally Cardio: regular rate and rhythm, S1, S2 normal, no murmur, click, rub or gallop GI: soft, non-tender; bowel sounds normal; no masses,  no organomegaly Extremities: extremities normal, atraumatic, no cyanosis or edema Pulses: 2+ and symmetric Neurologic: Grossly normal  Lab Results: Recent Labs    11/04/18 0415  WBC 13.4*  HGB 6.9*  HCT 19.4*  PLT 571*   BMET Recent Labs    11/04/18 0415 11/05/18 0604  NA 137 135  K 4.2 3.7  CL 107 106  CO2 19* 21*  GLUCOSE 77 84  BUN 9 9  CREATININE 0.49 0.45  CALCIUM 8.8* 8.7*    Studies/Results: No results found.  Medications: I have reviewed the patient's current medications.  Assessment/Plan: A 28 year old female admitted with sickle cell painful crisis.  #1 sickle cell painful crisis: Patient will be continued on current Dilaudid PCA.  Also on MS Contin.  Physician assistant dosing could be added  if needed.  We will transition her to oxycodone prior to discharge.  #2 [redacted] weeks gestation: Continue prenatal vitamins.  OB consulted for fetal monitoring.  #3 anemia of chronic disease: Type and crossmatch for transfusion possible 1 unit of packed red blood cells.  #4 chronic pain syndrome: Continue with MS Contin.  LOS: 1 day   Sina Lucchesi,LAWAL 11/05/2018, 11:29 PM

## 2018-11-05 NOTE — Progress Notes (Signed)
FHR 150 BPM by doppler. Pt reports fetal movement. Denies cramping or uc's. No vaginal bleeding or leaking of fluid.

## 2018-11-06 ENCOUNTER — Other Ambulatory Visit (HOSPITAL_COMMUNITY): Payer: Self-pay | Admitting: *Deleted

## 2018-11-06 DIAGNOSIS — O99013 Anemia complicating pregnancy, third trimester: Principal | ICD-10-CM

## 2018-11-06 DIAGNOSIS — M79609 Pain in unspecified limb: Secondary | ICD-10-CM

## 2018-11-06 DIAGNOSIS — D571 Sickle-cell disease without crisis: Secondary | ICD-10-CM

## 2018-11-06 LAB — COMPREHENSIVE METABOLIC PANEL
ALT: 26 U/L (ref 0–44)
AST: 30 U/L (ref 15–41)
Albumin: 3.1 g/dL — ABNORMAL LOW (ref 3.5–5.0)
Alkaline Phosphatase: 90 U/L (ref 38–126)
Anion gap: 6 (ref 5–15)
BUN: 6 mg/dL (ref 6–20)
CO2: 25 mmol/L (ref 22–32)
Calcium: 8.8 mg/dL — ABNORMAL LOW (ref 8.9–10.3)
Chloride: 104 mmol/L (ref 98–111)
Creatinine, Ser: 0.42 mg/dL — ABNORMAL LOW (ref 0.44–1.00)
GFR calc Af Amer: >60 mL/min (ref >60)
GFR calc non Af Amer: >60 mL/min (ref >60)
Glucose, Bld: 89 mg/dL (ref 70–99)
Potassium: 3.6 mmol/L (ref 3.5–5.1)
Sodium: 135 mmol/L (ref 135–145)
TOTAL PROTEIN: 6.8 g/dL (ref 6.5–8.1)
Total Bilirubin: 1.8 mg/dL — ABNORMAL HIGH (ref 0.3–1.2)

## 2018-11-06 LAB — CBC WITH DIFFERENTIAL/PLATELET
Abs Immature Granulocytes: 0.11 10*3/uL — ABNORMAL HIGH (ref 0.00–0.07)
BASOS ABS: 0 10*3/uL (ref 0.0–0.1)
Basophils Relative: 0 %
Eosinophils Absolute: 0.2 10*3/uL (ref 0.0–0.5)
Eosinophils Relative: 2 %
HCT: 25.6 % — ABNORMAL LOW (ref 36.0–46.0)
Hemoglobin: 8.7 g/dL — ABNORMAL LOW (ref 12.0–15.0)
Immature Granulocytes: 1 %
LYMPHS ABS: 2.2 10*3/uL (ref 0.7–4.0)
Lymphocytes Relative: 23 %
MCH: 30 pg (ref 26.0–34.0)
MCHC: 34 g/dL (ref 30.0–36.0)
MCV: 88.3 fL (ref 80.0–100.0)
Monocytes Absolute: 1.2 10*3/uL — ABNORMAL HIGH (ref 0.1–1.0)
Monocytes Relative: 12 %
Neutro Abs: 5.8 10*3/uL (ref 1.7–7.7)
Neutrophils Relative %: 62 %
Platelets: 542 10*3/uL — ABNORMAL HIGH (ref 150–400)
RBC: 2.9 MIL/uL — ABNORMAL LOW (ref 3.87–5.11)
RDW: 14.7 % (ref 11.5–15.5)
WBC: 9.6 10*3/uL (ref 4.0–10.5)
nRBC: 1.4 % — ABNORMAL HIGH (ref 0.0–0.2)

## 2018-11-06 LAB — PREPARE RBC (CROSSMATCH)

## 2018-11-06 MED ORDER — HYDROMORPHONE 1 MG/ML IV SOLN
INTRAVENOUS | Status: DC
  Administered 2018-11-06: 1.5 mg via INTRAVENOUS
  Administered 2018-11-06: 3.3 mg via INTRAVENOUS
  Administered 2018-11-06: 3 mg via INTRAVENOUS
  Administered 2018-11-07: 2.7 mg via INTRAVENOUS
  Administered 2018-11-07: 0.6 mg via INTRAVENOUS
  Administered 2018-11-07: 4.5 mg via INTRAVENOUS

## 2018-11-06 NOTE — Progress Notes (Signed)
Subjective: Carmen [name], a 28 year old female with a medical history significant for sickle cell anemia, chronic pain syndrome, third trimester pregnancy was admitted and sickle cell pain crisis.  Patient is currently [redacted] weeks pregnant.  Pain intensity 9/10 primarily to low back and lower extremities.  Patient also endorses fatigue.  Patient is status post 1 unit of packed red blood cells.  Previous hemoglobin was 6.9, which is below baseline of 8-9. Patient is undergoing daily fetal monitoring.  She denies vaginal discharge, vaginal bleeding, or cramping. Patient also denies chest pain, nausea, vomiting, or diarrhea.  Objective:  Vital signs in last 24 hours:  Vitals:   11/06/18 0802 11/06/18 1036 11/06/18 1116 11/06/18 1133  BP:  (!) 91/52 (!) 92/57   Pulse:  60    Resp: 11 12  (!) 25  Temp:  98 F (36.7 C)    TempSrc:  Oral    SpO2: 98% 100%  99%  Weight:      Height:        Intake/Output from previous day:   Intake/Output Summary (Last 24 hours) at 11/06/2018 1206 Last data filed at 11/06/2018 1000 Gross per 24 hour  Intake 3811.52 ml  Output 2100 ml  Net 1711.52 ml    Physical Exam: General: Alert, awake, oriented x3, in no acute distress.  HEENT: [place]/AT PEERL, EOMI Neck: Trachea midline,  no masses, no thyromegal,y no JVD, no carotid bruit OROPHARYNX:  Moist, No exudate/ erythema/lesions.  Heart: Regular rate and rhythm, without murmurs, rubs, gallops, PMI non-displaced, no heaves or thrills on palpation.  Lungs: Clear to auscultation, no wheezing or rhonchi noted. No increased vocal fremitus resonant to percussion  Abdomen: Soft, nontender, nondistended, positive bowel sounds, no masses no hepatosplenomegaly noted..  Neuro: No focal neurological deficits noted cranial nerves II through XII grossly intact. DTRs 2+ bilaterally upper and lower extremities. Strength 5 out of 5 in bilateral upper and lower extremities. Musculoskeletal: No warm swelling or erythema around  joints, no spinal tenderness noted. Psychiatric: Patient alert and oriented x3, good insight and cognition, good recent to remote recall. Lymph node survey: No cervical axillary or inguinal lymphadenopathy noted.  Lab Results:  Basic Metabolic Panel:    Component Value Date/Time   NA 135 11/06/2018 0813   NA 139 08/17/2018 0935   K 3.6 11/06/2018 0813   CL 104 11/06/2018 0813   CO2 25 11/06/2018 0813   BUN 6 11/06/2018 0813   BUN 9 08/17/2018 0935   CREATININE 0.42 (L) 11/06/2018 0813   GLUCOSE 89 11/06/2018 0813   CALCIUM 8.8 (L) 11/06/2018 0813   CBC:    Component Value Date/Time   WBC 9.6 11/06/2018 0813   HGB 8.7 (L) 11/06/2018 0813   HGB 7.9 (L) 09/15/2018 0933   HCT 25.6 (L) 11/06/2018 0813   HCT 22.8 (L) 09/15/2018 0933   PLT 542 (H) 11/06/2018 0813   PLT 555 (H) 09/15/2018 0933   MCV 88.3 11/06/2018 0813   MCV 90 09/15/2018 0933   NEUTROABS 5.8 11/06/2018 0813   NEUTROABS 7.9 (H) 09/15/2018 0933   LYMPHSABS 2.2 11/06/2018 0813   LYMPHSABS 2.4 09/15/2018 0933   MONOABS 1.2 (H) 11/06/2018 0813   EOSABS 0.2 11/06/2018 0813   EOSABS 0.1 09/15/2018 0933   BASOSABS 0.0 11/06/2018 0813   BASOSABS 0.0 09/15/2018 0933    No results found for this or any previous visit (from the past 240 hour(s)).  Studies/Results: No results found.  Medications: Scheduled Meds: . aspirin EC  81 mg  Oral Daily  . enoxaparin (LOVENOX) injection  40 mg Subcutaneous Q24H  . folic acid  1 mg Oral Daily  . HYDROmorphone   Intravenous Q4H  . prenatal multivitamin  1 tablet Oral Daily  . senna-docusate  1 tablet Oral BID  . Vitamin D (Ergocalciferol)  50,000 Units Oral Weekly   Continuous Infusions: . dextrose 5 % and 0.45% NaCl 100 mL/hr at 11/06/18 0555  . diphenhydrAMINE     PRN Meds:.diphenhydrAMINE **OR** diphenhydrAMINE, naloxone **AND** sodium chloride flush, ondansetron (ZOFRAN) IV, polyethylene glycol   Assessment/Plan: Active Problems:   Anemia of chronic disease    Supervision of high risk pregnancy, antepartum   Sickle cell anemia with crisis (HCC)  Sickle cell anemia with pain crisis: Continue Dilaudid PCA at settings of 0.3 mg, 10-minute lockout, and 2 mg/h Continue IV fluids Hold Toradol due to pregnancy  Chronic pain syndrome: Continue MS Contin Hold oxycodone, use PCA Dilaudid as substitute  High risk pregnancy, [redacted] weeks gestation: Continue prenatal vitamins.  OB consulted for fetal monitoring.  Today's fetal heart tones are 141.  Anemia of chronic disease: Patient is status post 1 unit packed red blood cells.  Continue to follow CBC.   Code Status: Full Code Family Communication: N/A Disposition Plan: Not yet ready for discharge  [place], MSN, FNP-C Patient [place], [place] [address] [phone]   If 5 PM-7AM, please contact night-coverage.  11/06/2018, 12:06 PM  LOS: 2 days

## 2018-11-06 NOTE — Progress Notes (Signed)
FHR 141 by doppler. Pt reports fetal movement. Denies cramping, vaginal bleeding, or leaking of fluid.

## 2018-11-07 LAB — TYPE AND SCREEN
ABO/RH(D): O POS
ANTIBODY SCREEN: NEGATIVE
Donor AG Type: NEGATIVE
Unit division: 0

## 2018-11-07 LAB — BPAM RBC
Blood Product Expiration Date: 202001222359
ISSUE DATE / TIME: 202001060211
Unit Type and Rh: 5100

## 2018-11-07 LAB — CBC
HCT: 28.9 % — ABNORMAL LOW (ref 36.0–46.0)
Hemoglobin: 9.8 g/dL — ABNORMAL LOW (ref 12.0–15.0)
MCH: 29.5 pg (ref 26.0–34.0)
MCHC: 33.9 g/dL (ref 30.0–36.0)
MCV: 87 fL (ref 80.0–100.0)
Platelets: 583 10*3/uL — ABNORMAL HIGH (ref 150–400)
RBC: 3.32 MIL/uL — AB (ref 3.87–5.11)
RDW: 14.6 % (ref 11.5–15.5)
WBC: 10.9 10*3/uL — ABNORMAL HIGH (ref 4.0–10.5)
nRBC: 1.4 % — ABNORMAL HIGH (ref 0.0–0.2)

## 2018-11-07 MED ORDER — OXYCODONE HCL 5 MG PO TABS
5.0000 mg | ORAL_TABLET | ORAL | Status: DC | PRN
  Administered 2018-11-07 – 2018-11-08 (×3): 5 mg via ORAL
  Filled 2018-11-07 (×3): qty 1

## 2018-11-07 MED ORDER — MORPHINE SULFATE ER 30 MG PO TBCR
30.0000 mg | EXTENDED_RELEASE_TABLET | Freq: Two times a day (BID) | ORAL | Status: DC
  Administered 2018-11-07 – 2018-11-10 (×7): 30 mg via ORAL
  Filled 2018-11-07 (×7): qty 1

## 2018-11-07 MED ORDER — HYDROMORPHONE 1 MG/ML IV SOLN
INTRAVENOUS | Status: DC
  Administered 2018-11-07: 2.1 mg via INTRAVENOUS
  Administered 2018-11-07: 3.2 mg via INTRAVENOUS
  Administered 2018-11-07: 3.9 mg via INTRAVENOUS
  Administered 2018-11-08 (×2): 3.3 mg via INTRAVENOUS
  Administered 2018-11-08: 0.9 mg via INTRAVENOUS
  Filled 2018-11-07: qty 30

## 2018-11-07 NOTE — Progress Notes (Signed)
Subjective: Patient continues to complain of increased pain to low back and lower extremities.  Pain intensity is 7-8/10.  Patient has not been out of bed.  She continues to endorse fatigue. Hemoglobin is improved to 8.7 following 1 unit of packed red blood cells on 11/06/2018. Patient afebrile and maintaining oxygen saturation above 90% on RA. She denies headache, chest pain, nausea, vomiting, or diarrhea.  Objective:  Vital signs in last 24 hours:  Vitals:   11/07/18 0606 11/07/18 0734 11/07/18 1030 11/07/18 1239  BP: 107/68  112/69   Pulse: 69  64   Resp: 15 14 15 14   Temp: 98.1 F (36.7 C)  98.4 F (36.9 C)   TempSrc: Oral  Oral   SpO2: 100% 100% 95% 100%  Weight: 73.6 kg     Height:        Intake/Output from previous day:   Intake/Output Summary (Last 24 hours) at 11/07/2018 1320 Last data filed at 11/07/2018 1030 Gross per 24 hour  Intake 2196.22 ml  Output 1300 ml  Net 896.22 ml    Physical Exam: General: Alert, awake, oriented x3, in no acute distress.  HEENT: [place]/AT PEERL, EOMI Neck: Trachea midline,  no masses, no thyromegal,y no JVD, no carotid bruit OROPHARYNX:  Moist, No exudate/ erythema/lesions.  Heart: Regular rate and rhythm, without murmurs, rubs, gallops, PMI non-displaced, no heaves or thrills on palpation.  Lungs: Clear to auscultation, no wheezing or rhonchi noted. No increased vocal fremitus resonant to percussion  Abdomen: Soft, nontender, nondistended, positive bowel sounds, no masses no hepatosplenomegaly noted..  Neuro: No focal neurological deficits noted cranial nerves II through XII grossly intact. DTRs 2+ bilaterally upper and lower extremities. Strength 5 out of 5 in bilateral upper and lower extremities. Musculoskeletal: No warm swelling or erythema around joints, no spinal tenderness noted. Psychiatric: Patient alert and oriented x3, good insight and cognition, good recent to remote recall. Lymph node survey: No cervical axillary or inguinal  lymphadenopathy noted.  Lab Results:  Basic Metabolic Panel:    Component Value Date/Time   NA 135 11/06/2018 0813   NA 139 08/17/2018 0935   K 3.6 11/06/2018 0813   CL 104 11/06/2018 0813   CO2 25 11/06/2018 0813   BUN 6 11/06/2018 0813   BUN 9 08/17/2018 0935   CREATININE 0.42 (L) 11/06/2018 0813   GLUCOSE 89 11/06/2018 0813   CALCIUM 8.8 (L) 11/06/2018 0813   CBC:    Component Value Date/Time   WBC 10.9 (H) 11/07/2018 1015   HGB 9.8 (L) 11/07/2018 1015   HGB 7.9 (L) 09/15/2018 0933   HCT 28.9 (L) 11/07/2018 1015   HCT 22.8 (L) 09/15/2018 0933   PLT 583 (H) 11/07/2018 1015   PLT 555 (H) 09/15/2018 0933   MCV 87.0 11/07/2018 1015   MCV 90 09/15/2018 0933   NEUTROABS 5.8 11/06/2018 0813   NEUTROABS 7.9 (H) 09/15/2018 0933   LYMPHSABS 2.2 11/06/2018 0813   LYMPHSABS 2.4 09/15/2018 0933   MONOABS 1.2 (H) 11/06/2018 0813   EOSABS 0.2 11/06/2018 0813   EOSABS 0.1 09/15/2018 0933   BASOSABS 0.0 11/06/2018 0813   BASOSABS 0.0 09/15/2018 0933    No results found for this or any previous visit (from the past 240 hour(s)).  Studies/Results: No results found.  Medications: Scheduled Meds: . aspirin EC  81 mg Oral Daily  . enoxaparin (LOVENOX) injection  40 mg Subcutaneous Q24H  . folic acid  1 mg Oral Daily  . HYDROmorphone   Intravenous Q4H  .  morphine  30 mg Oral Q12H  . prenatal multivitamin  1 tablet Oral Daily  . senna-docusate  1 tablet Oral BID  . Vitamin D (Ergocalciferol)  50,000 Units Oral Weekly   Continuous Infusions: . dextrose 5 % and 0.45% NaCl 50 mL/hr at 11/07/18 1000  . diphenhydrAMINE     PRN Meds:.diphenhydrAMINE **OR** diphenhydrAMINE, naloxone **AND** sodium chloride flush, ondansetron (ZOFRAN) IV, oxyCODONE, polyethylene glycol  Assessment/Plan: Active Problems:   Sickle cell anemia with pain (HCC)   Anemia of chronic disease   Supervision of high risk pregnancy, antepartum   Sickle cell anemia with crisis (HCC)   Pain in  extremity   Sickle cell anemia with pain crisis: Weaning Dilaudid PCA Oxycodone 5 mg every 4 hours as needed for severe breakthrough pain Continue IV fluid at 50 mL/h Maintain oxygen saturation above 90%  Chronic pain syndrome: Restart MS Contin 30 mg every 12 hours per home dosage Oxycodone 5 mg every 4 hours as needed for moderate to severe breakthrough pain  High risk pregnancy, [redacted] weeks gestation: Continue prenatal vitamins OB consulted for fetal monitoring  Anemia of chronic disease: Hemoglobin is improved to 8.7.  Patient received 1 unit of packed red blood cells on 11/06/2018. Continue to follow CBC    Code Status: Full Code Family Communication: N/A Disposition Plan: Not yet ready for discharge   [place], MSN, FNP-C Patient [place], [place] [address] [phone]  If 5PM-7AM, please contact night-coverage.  11/07/2018, 1:20 PM  LOS: 3 days

## 2018-11-07 NOTE — Progress Notes (Signed)
Here to perform daily FHT Doppler.  Pt denies vaginal bleeding or LOF and reports good fetal movement. Per pt her meds will be switching from IV to PO and her hemoglobin is 9.  FHT's 147-151 bpm.

## 2018-11-08 MED ORDER — HYDROMORPHONE HCL 1 MG/ML IJ SOLN
1.0000 mg | INTRAMUSCULAR | Status: DC | PRN
  Administered 2018-11-08: 1 mg via SUBCUTANEOUS
  Filled 2018-11-08: qty 1

## 2018-11-08 MED ORDER — HYDROMORPHONE HCL 1 MG/ML IJ SOLN
1.0000 mg | INTRAMUSCULAR | Status: DC | PRN
  Administered 2018-11-08 – 2018-11-09 (×4): 1 mg via INTRAVENOUS
  Filled 2018-11-08 (×6): qty 1

## 2018-11-08 MED ORDER — OXYCODONE HCL 5 MG PO TABS
5.0000 mg | ORAL_TABLET | ORAL | Status: DC | PRN
  Administered 2018-11-08 – 2018-11-09 (×3): 5 mg via ORAL
  Filled 2018-11-08 (×3): qty 1

## 2018-11-08 MED ORDER — HYDROMORPHONE HCL 2 MG/ML IJ SOLN
2.0000 mg | Freq: Once | INTRAMUSCULAR | Status: AC
  Administered 2018-11-08: 2 mg via INTRAVENOUS
  Filled 2018-11-08: qty 1

## 2018-11-08 NOTE — Progress Notes (Signed)
Pt. complained of increase in pain, rates pain 9/10. States pain regime not working for her. Requesting an increase in pain medication.  MD notified and new orders received, medicated with Oxycodone 5mg  po.

## 2018-11-08 NOTE — Progress Notes (Signed)
Pt c/o of pain 10/10 to lower back. MD notified.  MD ordered 2mg  of dilaudid, RN administered as ordered.

## 2018-11-08 NOTE — Progress Notes (Signed)
Subjective: Patient states the pain is much better today.  Pain intensity 6/10.  Pain is primarily to low back and lower extremities.  Patient appears to be resting comfortably Patient has remained afebrile overnight. Patient denies headache, chest pain, shortness of breath, nausea, vomiting, dysuria, or diarrhea.  Objective:  Vital signs in last 24 hours:  Vitals:   11/08/18 0832 11/08/18 0930 11/08/18 1100 11/08/18 1424  BP:  110/65  108/63  Pulse:  61  68  Resp: (!) 21 16 20 16   Temp:  98.4 F (36.9 C)    TempSrc:  Oral    SpO2: 100% 92% 100% 100%  Weight:      Height:        Intake/Output from previous day:   Intake/Output Summary (Last 24 hours) at 11/08/2018 1534 Last data filed at 11/08/2018 1400 Gross per 24 hour  Intake 720 ml  Output -  Net 720 ml    Physical Exam: General: Alert, awake, oriented x3, in no acute distress.  HEENT: /AT [place]ER[place], EOMI Neck: Trachea midline,  no masses, no thyromegal,y no JVD, no carotid bruit OROPHARYNX:  Moist, No exudate/ erythema/lesions.  Heart: Regular rate and rhythm, without murmurs, rubs, gallops, PMI non-displaced, no heaves or thrills on palpation.  Lungs: Clear to auscultation, no wheezing or rhonchi noted. No increased vocal fremitus resonant to percussion  Abdomen: Soft, nontender, nondistended, positive bowel sounds, no masses no hepatosplenomegaly noted..  Neuro: No focal neurological deficits noted cranial nerves II through XII grossly intact. DTRs 2+ bilaterally upper and lower extremities. Strength 5 out of 5 in bilateral upper and lower extremities. Musculoskeletal: No warm swelling or erythema around joints, no spinal tenderness noted. Psychiatric: Patient alert and oriented x3, good insight and cognition, good recent to remote recall. Lymph node survey: No cervical axillary or inguinal lymphadenopathy noted.  Lab Results:  Basic Metabolic Panel:    Component Value Date/Time   NA 135 11/06/2018 0813   NA 139  08/17/2018 0935   K 3.6 11/06/2018 0813   CL 104 11/06/2018 0813   CO2 25 11/06/2018 0813   BUN 6 11/06/2018 0813   BUN 9 08/17/2018 0935   CREATININE 0.42 (L) 11/06/2018 0813   GLUCOSE 89 11/06/2018 0813   CALCIUM 8.8 (L) 11/06/2018 0813   CBC:    Component Value Date/Time   WBC 10.9 (H) 11/07/2018 1015   HGB 9.8 (L) 11/07/2018 1015   HGB 7.9 (L) 09/15/2018 0933   HCT 28.9 (L) 11/07/2018 1015   HCT 22.8 (L) 09/15/2018 0933   PLT 583 (H) 11/07/2018 1015   PLT 555 (H) 09/15/2018 0933   MCV 87.0 11/07/2018 1015   MCV 90 09/15/2018 0933   NEUTROABS 5.8 11/06/2018 0813   NEUTROABS 7.9 (H) 09/15/2018 0933   LYMPHSABS 2.2 11/06/2018 0813   LYMPHSABS 2.4 09/15/2018 0933   MONOABS 1.2 (H) 11/06/2018 0813   EOSABS 0.2 11/06/2018 0813   EOSABS 0.1 09/15/2018 0933   BASOSABS 0.0 11/06/2018 0813   BASOSABS 0.0 09/15/2018 0933    No results found for this or any previous visit (from the past 240 hour(s)).  Studies/Results: No results found.  Medications: Scheduled Meds: . aspirin EC  81 mg Oral Daily  . enoxaparin (LOVENOX) injection  40 mg Subcutaneous Q24H  . folic acid  1 mg Oral Daily  . morphine  30 mg Oral Q12H  . prenatal multivitamin  1 tablet Oral Daily  . senna-docusate  1 tablet Oral BID  . Vitamin D (Ergocalciferol)  50,000 Units Oral Weekly   Continuous Infusions: . dextrose 5 % and 0.45% NaCl 10 mL/hr at 11/08/18 1058   PRN Meds:.HYDROmorphone (DILAUDID) injection, oxyCODONE, polyethylene glycol    Assessment/Plan: Active Problems:   Sickle cell anemia with pain (HCC)   Anemia of chronic disease   Supervision of high risk pregnancy, antepartum   Sickle cell anemia with crisis (HCC)   Pain in extremity  Sickle cell anemia with pain crisis:  Discontinue dilaudid PCA  Oxycodone 5 mg every 4 hours as needed for severe breakthrough pain Continue IV fluid at [place] oxygen saturation above 90%  Chronic pain syndrome: Continue MS Contin 30 mg every  12 hours Oxycodone 5 mg every 4 hours  High risk pregnancy, [redacted] weeks gestation: Continue prenatal vitamins OB consulted for fetal monitoring  Anemia of chronic disease: Hemoglobin stable.  Repeat CBC in a.m.  Patient status post 1 unit of packed red blood cells on 11/06/2018    Code Status: Full Code Family Communication: N/A Disposition Plan: Not yet ready for discharge  [place]enn [place], MSN, FNP-C Patient [place] [address] [place], [place] [address] [phone]  If 5PM-7AM, please contact night-coverage.  11/08/2018, 3:34 PM  LOS: 4 days

## 2018-11-08 NOTE — Progress Notes (Signed)
RROB performing daily Doppler.  FHR 155bpm. Pt says she has not had any LOF or vaginal bleeding. Pt also reports positive fetal movement.  Says she is expecting to DC tomorrow.

## 2018-11-08 NOTE — Progress Notes (Addendum)
Pt. complained of pain 10/10 states pain is not just back area, but spreading "all over"  Pt. requesting increase in pain medication. MD paged with no response.  Requested night shift RN page new oncoming MD.

## 2018-11-09 LAB — CBC WITH DIFFERENTIAL/PLATELET
Abs Immature Granulocytes: 0.11 10*3/uL — ABNORMAL HIGH (ref 0.00–0.07)
BASOS ABS: 0 10*3/uL (ref 0.0–0.1)
Basophils Relative: 0 %
Eosinophils Absolute: 0.2 10*3/uL (ref 0.0–0.5)
Eosinophils Relative: 2 %
HCT: 27.2 % — ABNORMAL LOW (ref 36.0–46.0)
Hemoglobin: 9.7 g/dL — ABNORMAL LOW (ref 12.0–15.0)
Immature Granulocytes: 1 %
Lymphocytes Relative: 13 %
Lymphs Abs: 1.7 10*3/uL (ref 0.7–4.0)
MCH: 31.2 pg (ref 26.0–34.0)
MCHC: 35.7 g/dL (ref 30.0–36.0)
MCV: 87.5 fL (ref 80.0–100.0)
Monocytes Absolute: 1.1 10*3/uL — ABNORMAL HIGH (ref 0.1–1.0)
Monocytes Relative: 9 %
NEUTROS PCT: 75 %
Neutro Abs: 9.6 10*3/uL — ABNORMAL HIGH (ref 1.7–7.7)
Platelets: 541 10*3/uL — ABNORMAL HIGH (ref 150–400)
RBC: 3.11 MIL/uL — ABNORMAL LOW (ref 3.87–5.11)
RDW: 14.5 % (ref 11.5–15.5)
WBC: 12.7 10*3/uL — ABNORMAL HIGH (ref 4.0–10.5)
nRBC: 0.7 % — ABNORMAL HIGH (ref 0.0–0.2)

## 2018-11-09 LAB — BASIC METABOLIC PANEL
Anion gap: 6 (ref 5–15)
BUN: 9 mg/dL (ref 6–20)
CO2: 23 mmol/L (ref 22–32)
Calcium: 9.1 mg/dL (ref 8.9–10.3)
Chloride: 106 mmol/L (ref 98–111)
Creatinine, Ser: 0.45 mg/dL (ref 0.44–1.00)
GFR calc non Af Amer: >60 mL/min (ref >60)
Glucose, Bld: 98 mg/dL (ref 70–99)
Potassium: 3.6 mmol/L (ref 3.5–5.1)
Sodium: 135 mmol/L (ref 135–145)

## 2018-11-09 MED ORDER — HYDROMORPHONE HCL 1 MG/ML IJ SOLN
1.0000 mg | Freq: Once | INTRAMUSCULAR | Status: AC
  Administered 2018-11-09: 1 mg via INTRAVENOUS

## 2018-11-09 MED ORDER — ONDANSETRON HCL 4 MG/2ML IJ SOLN: 4.0000 mg | Freq: Four times a day (QID) | INTRAMUSCULAR | Status: DC | PRN

## 2018-11-09 MED ORDER — HYDROMORPHONE 1 MG/ML IV SOLN
INTRAVENOUS | Status: DC
  Administered 2018-11-09: 4.5 mg via INTRAVENOUS
  Administered 2018-11-09: 2.7 mg via INTRAVENOUS
  Administered 2018-11-09: 1.8 mg via INTRAVENOUS
  Administered 2018-11-09: 30 mg via INTRAVENOUS
  Administered 2018-11-10: 0.9 mg via INTRAVENOUS
  Administered 2018-11-10: 1.5 mg via INTRAVENOUS
  Filled 2018-11-09: qty 30

## 2018-11-09 MED ORDER — DIPHENHYDRAMINE HCL 25 MG PO CAPS: 25.0000 mg | ORAL_CAPSULE | ORAL | Status: DC | PRN

## 2018-11-09 MED ORDER — OXYCODONE HCL 5 MG PO TABS: 10.0000 mg | ORAL_TABLET | ORAL | Status: DC | PRN

## 2018-11-09 MED ORDER — SODIUM CHLORIDE 0.9% FLUSH: 9.0000 mL | INTRAVENOUS | Status: DC | PRN

## 2018-11-09 MED ORDER — NALOXONE HCL 0.4 MG/ML IJ SOLN: 0.4000 mg | INTRAMUSCULAR | Status: DC | PRN

## 2018-11-09 NOTE — Progress Notes (Signed)
Subjective: Carmen states that pain increased overnight.  Pain intensity 8/10.  Pain is primarily to low back and lower extremities.  Carmen has remained afebrile overnight. Carmen denies headache, chest pain, shortness of breath, nausea, vomiting, dysuria, or diarrhea.  Objective:  Vital signs in last 24 hours:  Vitals:   11/09/18 0216 11/09/18 0534 11/09/18 1004 11/09/18 1028  BP: 106/68 111/72 108/74   Pulse: 69 66 79   Resp: 15 16 16 13   Temp: 99 F (37.2 C) 98.7 F (37.1 C) 98.8 F (37.1 C)   TempSrc: Oral Oral Oral   SpO2: 100% 100% 100% 97%  Weight:  76.4 kg    Height:        Intake/Output from previous day:   Intake/Output Summary (Last 24 hours) at 11/09/2018 1601 Last data filed at 11/09/2018 1004 Gross per 24 hour  Intake 480 ml  Output -  Net 480 ml    Physical Exam: General: Alert, awake, oriented x3, in no acute distress.  HEENT: [place]/AT PEERL, EOMI Neck: Trachea midline,  no masses, no thyromegal,y no JVD, no carotid bruit OROPHARYNX:  Moist, No exudate/ erythema/lesions.  Heart: Regular rate and rhythm, without murmurs, rubs, gallops, PMI non-displaced, no heaves or thrills on palpation.  Lungs: Clear to auscultation, no wheezing or rhonchi noted. No increased vocal fremitus resonant to percussion  Abdomen: Soft, nontender, nondistended, positive bowel sounds, no masses no hepatosplenomegaly noted..  Neuro: No focal neurological deficits noted cranial nerves II through XII grossly intact. DTRs 2+ bilaterally upper and lower extremities. Strength 5 out of 5 in bilateral upper and lower extremities. Musculoskeletal: No warm swelling or erythema around joints, no spinal tenderness noted. Psychiatric: Carmen alert and oriented x3, good insight and cognition, good recent to remote recall. Lymph node survey: No cervical axillary or inguinal lymphadenopathy noted.  Lab Results:  Basic Metabolic Panel:    Component Value Date/Time   NA 135 11/09/2018 1125   NA 139 08/17/2018 0935   K 3.6 11/09/2018 1125   CL 106 11/09/2018 1125   CO2 23 11/09/2018 1125   BUN 9 11/09/2018 1125   BUN 9 08/17/2018 0935   CREATININE 0.45 11/09/2018 1125   GLUCOSE 98 11/09/2018 1125   CALCIUM 9.1 11/09/2018 1125   CBC:    Component Value Date/Time   WBC 12.7 (H) 11/09/2018 1125   HGB 9.7 (L) 11/09/2018 1125   HGB 7.9 (L) 09/15/2018 0933   HCT 27.2 (L) 11/09/2018 1125   HCT 22.8 (L) 09/15/2018 0933   PLT 541 (H) 11/09/2018 1125   PLT 555 (H) 09/15/2018 0933   MCV 87.5 11/09/2018 1125   MCV 90 09/15/2018 0933   NEUTROABS 9.6 (H) 11/09/2018 1125   NEUTROABS 7.9 (H) 09/15/2018 0933   LYMPHSABS 1.7 11/09/2018 1125   LYMPHSABS 2.4 09/15/2018 0933   MONOABS 1.1 (H) 11/09/2018 1125   EOSABS 0.2 11/09/2018 1125   EOSABS 0.1 09/15/2018 0933   BASOSABS 0.0 11/09/2018 1125   BASOSABS 0.0 09/15/2018 0933    No results found for this or any previous visit (from the past 240 hour(s)).  Studies/Results: No results found.  Medications: Scheduled Meds: . aspirin EC  81 mg Oral Daily  . enoxaparin (LOVENOX) injection  40 mg Subcutaneous Q24H  . folic acid  1 mg Oral Daily  . HYDROmorphone   Intravenous Q4H  . morphine  30 mg Oral Q12H  . prenatal multivitamin  1 tablet Oral Daily  . senna-docusate  1 tablet Oral BID  . Vitamin  D (Ergocalciferol)  50,000 Units Oral Weekly   Continuous Infusions: . dextrose 5 % and 0.45% NaCl 10 mL/hr at 11/09/18 0525   PRN Meds:.diphenhydrAMINE, naloxone **AND** sodium chloride flush, ondansetron (ZOFRAN) IV, oxyCODONE, polyethylene glycol    Assessment/Plan: Active Problems:   Sickle cell anemia with pain (HCC)   Anemia of chronic disease   Supervision of high risk pregnancy, antepartum   Sickle cell anemia with crisis ([place])   Pain in extremity  Sickle cell anemia with pain crisis:  Dilaudid PCA with settings of 0.3 mg bolus, 10-minute lockout, and 2 mg/h Oxycodone 10 mg every 4 hours for severe, breakthrough  pain Continue IV fluid at [place] oxygen saturation above 90%  Chronic pain syndrome: Continue MS Contin 30 mg every 12 hours Oxycodone 10 mg every 4 hours as needed  High risk pregnancy, [redacted] weeks gestation: Continue prenatal vitamins OB consulted for fetal monitoring  Anemia of chronic disease: Hemoglobin at baseline, 9.7, Carmen status post 1 unit of packed red blood cells on 11/06/2018 Continue to follow CBC    Code Status: Full Code Family Communication: N/A Disposition Plan: Not yet ready for discharge  [name], MSN, FNP-C [name] [address] [place], [place] [address] [phone]  If 5PM-7AM, please contact night-coverage.  11/09/2018, 4:01 PM  LOS: 5 days

## 2018-11-09 NOTE — Progress Notes (Signed)
Doppler FHT at 158bpm.  Pt reports no leaking of fluid or vaginal bleeding.  Fetal movement noted during doppler.  Pt says she may be discharging later today.

## 2018-11-09 NOTE — Progress Notes (Signed)
Pt is due 1mg  of Dilaudid, however she is req 2mg  of dilaudid. Pt states, " 1mg  doesn't do enough for my pain". MD notified. MD ordered an additional 1mg  of dilaudid to go with existing order. RN administered as ordered

## 2018-11-10 NOTE — Discharge Instructions (Signed)
Sickle Cell Anemia, Adult °Sickle cell anemia is a condition where your red blood cells are shaped like sickles. Red blood cells carry oxygen through the body. Sickle-shaped cells do not live as long as normal red blood cells. They also clump together and block blood from flowing through the blood vessels. This prevents the body from getting enough oxygen. Sickle cell anemia causes organ damage and pain. It also increases the risk of infection. °Follow these instructions at home: °Medicines °· Take over-the-counter and prescription medicines only as told by your doctor. °· If you were prescribed an antibiotic medicine, take it as told by your doctor. Do not stop taking the antibiotic even if you start to feel better. °· If you develop a fever, do not take medicines to lower the fever right away. Tell your doctor about the fever. °Managing pain, stiffness, and swelling °· Try these methods to help with pain: °? Use a heating pad. °? Take a warm bath. °? Distract yourself, such as by watching TV. °Eating and drinking °· Drink enough fluid to keep your pee (urine) clear or pale yellow. Drink more in hot weather and during exercise. °· Limit or avoid alcohol. °· Eat a healthy diet. Eat plenty of fruits, vegetables, whole grains, and lean protein. °· Take vitamins and supplements as told by your doctor. °Traveling °· When traveling, keep these with you: °? Your medical information. °? The names of your doctors. °? Your medicines. °· If you need to take an airplane, talk to your doctor first. °Activity °· Rest often. °· Avoid exercises that make your heart beat much faster, such as jogging. °General instructions °· Do not use products that have nicotine or tobacco, such as cigarettes and e-cigarettes. If you need help quitting, ask your doctor. °· Consider wearing a medical alert bracelet. °· Avoid being in high places (high altitudes), such as mountains. °· Avoid very hot or cold temperatures. °· Avoid places where the  temperature changes a lot. °· Keep all follow-up visits as told by your doctor. This is important. °Contact a doctor if: °· A joint hurts. °· Your feet or hands hurt or swell. °· You feel tired (fatigued). °Get help right away if: °· You have symptoms of infection. These include: °? Fever. °? Chills. °? Being very tired. °? Irritability. °? Poor eating. °? Throwing up (vomiting). °· You feel dizzy or faint. °· You have new stomach pain, especially on the left side. °· You have a an erection (priapism) that lasts more than 4 hours. °· You have numbness in your arms or legs. °· You have a hard time moving your arms or legs. °· You have trouble talking. °· You have pain that does not go away when you take medicine. °· You are short of breath. °· You are breathing fast. °· You have a long-term cough. °· You have pain in your chest. °· You have a bad headache. °· You have a stiff neck. °· Your stomach looks bloated even though you did not eat much. °· Your skin is pale. °· You suddenly cannot see well. °Summary °· Sickle cell anemia is a condition where your red blood cells are shaped like sickles. °· Follow your doctor's advice on ways to manage pain, food to eat, activities to do, and steps to take for safe travel. °· Get medical help right away if you have any signs of infection, such as a fever. °This information is not intended to replace advice given to you by your   health care provider. Make sure you discuss any questions you have with your health care provider. °Document Released: 08/08/2013 Document Revised: 11/23/2016 Document Reviewed: 11/23/2016 °[employer] Interactive Patient Education © 2019 [employer] ° °

## 2018-11-10 NOTE — Discharge Summary (Signed)
Physician Discharge Summary  CARAGH GASPER YQI:[id number] DOB: Mar 19, 1991 DOA: 11/04/2018  PCP: Lanae Boast, FNP  Admit date: 11/04/2018  Discharge date: 11/10/2018  Discharge Diagnoses:  Active Problems:   Sickle cell anemia with pain (HCC)   Anemia of chronic disease   Supervision of high risk pregnancy, antepartum   Sickle cell anemia with crisis ([place])   Pain in extremity   Discharge Condition: Stable  Disposition:  Follow-up Information    Lanae Boast, FNP Follow up.   Specialty:  Family Medicine Why:  Wednesday Contact information: [place] [id number] [phone]          Pt is discharged home in good condition and is to follow up with Lanae Boast, FNP this week to have labs evaluated. CARLYN LEMKE is instructed to increase activity slowly and balance with rest for the next few days, and use prescribed medication to complete treatment of pain  Diet: Regular Wt Readings from Last 3 Encounters:  11/10/18 76.4 kg  11/03/18 74.6 kg  11/02/18 73.5 kg    History of present illness:  Amenah Tucci, a 28 year old female with a history of sickle cell anemia and chronic pain syndrome in second trimester pregnancy presented complaining of pain primarily to bilateral upper and lower extremities.  Patient characterizes pain as 10/10 in intensity.  Patient attributes current pain crisis to a recent change in pain medications to MS Contin as long-acting medication.  Patient has been unable to get medication, she is awaiting prior authorization.  ER course: Vital signs within normal range.  Laboratory values consistent with baseline.  Pain persists despite 6 mg IV Dilaudid, and IV fluids.  Hospital Course:  Patient admitted with sickle cell pain crisis contributed to being without long-acting pain medications.  Pain was controlled with IV Dilaudid PCA and IV fluids.  Also, other adjunct therapies were initiated per pain management  protocols. Hemoglobin decreased to 6.6, patient was transfused 1 unit of packed red blood cells.  Hemoglobin 9.7 following transfusion, which is consistent with baseline. Pain intensity 5/10 at discharge.  Patient alert, oriented, and ambulating without assistance.  Patient maintaining oxygen saturation above 90% on room air.  All vital signs unremarkable.  Patient in second trimester pregnancy: Patient [redacted] weeks pregnant.  OB consulted to monitor during admission.  Most recent Doppler shows FHT at 158 bpm.  Patient continues to report no leaking of fluid, cramping, or vaginal bleeding fetal movement noted per patient.  Patient was discharged home today in a hemodynamically stable condition.  Patient is aware of upcoming appointments in primary care, to repeat CBC and BMP at that time.  Also, recommend hemoglobinopathy during follow-up appointment, patient may require exchange transfusion in third trimester pregnancy if hemoglobin S is greater than 30%.  Discharge Exam: Vitals:   11/10/18 0831 11/10/18 0931  BP:  110/64  Pulse:  89  Resp: (!) 21 16  Temp:  98.6 F (37 C)  SpO2: 90% 100%   Vitals:   11/10/18 0435 11/10/18 0623 11/10/18 0831 11/10/18 0931  BP:  106/72  110/64  Pulse:  68  89  Resp: 16 14 (!) 21 16  Temp:  98.4 F (36.9 C)  98.6 F (37 C)  TempSrc:  Oral  Oral  SpO2: 100% 100% 90% 100%  Weight:  76.4 kg    Height:        General appearance : Awake, alert, not in any distress. Speech Clear. Not toxic looking HEENT: Atraumatic and  Normocephalic, pupils equally reactive to light and accomodation Neck: Supple, no JVD. No cervical lymphadenopathy.  Chest: Good air entry bilaterally, no added sounds  CVS: S1 S2 regular, no murmurs.  Abdomen: Bowel sounds present, Non tender and not distended with no gaurding, rigidity or rebound. Extremities: B/L Lower Ext shows no edema, both legs are warm to touch Neurology: Awake alert, and oriented X 3, CN II-XII intact, Non  focal Skin: No Rash  Discharge Instructions  Discharge Instructions    Discharge patient   Complete by:  As directed    Discharge disposition:  01-Home or Self Care   Discharge patient date:  11/10/2018     Allergies as of 11/10/2018   No Known Allergies     Medication List    TAKE these medications   aspirin EC 81 MG tablet Take 1 tablet (81 mg total) by mouth daily.   ENSURE HEALTHY MOM Liqd Drink 1 pouch BID   ergocalciferol 1.25 MG (50000 UT) capsule Commonly known as:  VITAMIN D2 Take 1 capsule (50,000 Units total) by mouth once a week.   folic acid 1 MG tablet Commonly known as:  FOLVITE Take 1 tablet (1 mg total) by mouth daily.   morphine 30 MG 12 hr tablet Commonly known as:  MS CONTIN Take 1 tablet (30 mg total) by mouth every 8 (eight) hours as needed for pain.   oxycodone 5 MG capsule Commonly known as:  OXY-IR Take 1 capsule (5 mg total) by mouth every 4 (four) hours as needed for up to 15 days.   PREPLUS 27-1 MG Tabs Take 1 tablet by mouth daily.       The results of significant diagnostics from this hospitalization (including imaging, microbiology, ancillary and laboratory) are listed below for reference.    Significant Diagnostic Studies: [place] Mfm Ob Follow Up  Result Date: 11/03/2018 ----------------------------------------------------------------------  OBSTETRICS REPORT                       (Signed Final 11/03/2018 04:02 pm) ---------------------------------------------------------------------- Patient Info  ID #:       [id number]                          D.O.B.:  [birth year] (27 yrs)  Name:       SHANDEE JERGENS [place]                  Visit Date: 11/03/2018 03:20 pm ---------------------------------------------------------------------- Performed By  Performed By:     Valda Favia          Ref. Address:     [facility]                                                              [phone]  [place], [place]  Attending:        Tama High MD        Location:         [facility]  Referred By:      [facility] for                    [place] ---------------------------------------------------------------------- Orders   #  Description                          Code         Ordered By   1  [place] MFM OB FOLLOW UP                  [phone]     Tama High  ----------------------------------------------------------------------   #  Order #                    Accession #                 Episode #   1  [id number]                  [id number]                  [id number]  ---------------------------------------------------------------------- Indications   [redacted] weeks gestation of pregnancy                Z3A.24   Maternal sickle cell anemia, second trimester  O99.012, E75.1   Drug use complicating pregnancy, second        O99.322   trimester (chronic opiate use)   Encounter for other antenatal screening        Z36.2   follow-up  ---------------------------------------------------------------------- Fetal Evaluation  Num Of Fetuses:         1  Fetal Heart Rate(bpm):  155  Cardiac Activity:       Observed  Presentation:           Cephalic  Placenta:               Posterior  P. Cord Insertion:      Previously  Visualized  Amniotic Fluid  AFI FV:      Within normal limits                              Largest Pocket(cm)                              4.6 ---------------------------------------------------------------------- Biometry  BPD:      59.9  mm     G. Age:  24w 3d         37  %    CI:        76.67   %    70 - 86                                                          FL/HC:      18.8   %    18.7 - 20.3  HC:      216.7  mm     G. Age:   23w 5d          9  %    HC/AC:      1.04        1.04 - 1.22  AC:       209   mm     G. Age:  25w 4d         68  %    FL/BPD:     67.9   %    71 - 87  FL:       40.7  mm     G. Age:  23w 1d          7  %    FL/AC:      19.5   %    20 - 24  HUM:      39.1  mm     G. Age:  24w 0d         27  %  Est. FW:     693  gm      1 lb 8 oz     49  % ---------------------------------------------------------------------- OB History  Gravidity:    2  TOP:          1 ---------------------------------------------------------------------- Gestational Age  LMP:           24w 4d        Date:  05/15/18                 EDD:   02/19/19  U/S Today:     24w 2d                                        EDD:   02/21/19  Best:          24w 4d     Det. By:  LMP  (05/15/18)          EDD:   02/19/19 ---------------------------------------------------------------------- Anatomy  Cranium:               Appears normal         LVOT:  Appears normal  Cavum:                 Previously seen        Aortic Arch:            Appears normal  Ventricles:            Appears normal         Ductal Arch:            Not well visualized  Choroid Plexus:        Previously seen        Diaphragm:              Previously seen  Cerebellum:            Previously seen        Stomach:                Appears normal, left                                                                        sided  Posterior Fossa:       Previously seen        Abdomen:                Previously seen  Nuchal Fold:           Previously seen        Abdominal Wall:         Previously seen  Face:                  Orbits and profile     Cord Vessels:           Previously seen                         previously seen  Lips:                  Previously seen        Kidneys:                Appear normal  Palate:                Previously seen        Bladder:                Appears normal  Thoracic:              Appears normal         Spine:                  Previously seen  Heart:                  Appears normal         Upper Extremities:      Previously seen                         (4CH, axis, and situs  RVOT:                  Appears normal         Lower Extremities:  Previously seen  Other:  Fetus appears to be a female. Heels and 5th digit visualized previously.          Open hands visualized previously. Nasal bone visualized previously. ---------------------------------------------------------------------- Cervix Uterus Adnexa  Uterus  No abnormality visualized.  Left Ovary  No adnexal mass visualized.  Right Ovary  No adnexal mass visualized.  Cul De Sac  No free fluid seen.  Adnexa  No abnormality visualized. ---------------------------------------------------------------------- Impression  Maternal sickle-cell disease. Patient feels well and does not  have pain now (crisis).  Fetal growth is appropriate for gestational age. Amniotic fluid  is normal and good fetal activity is seen.  Cardiac anatomy is normal (except ductal arch that could not  be visualized).  We reassured the patient of the findings. ---------------------------------------------------------------------- Recommendations  An appointment was made for her to return in 4 weeks for  fetal growth assessment. ----------------------------------------------------------------------                  Tama High, MD Electronically Signed Final Report   11/03/2018 04:02 pm ----------------------------------------------------------------------   Microbiology: No results found for this or any previous visit (from the past 240 hour(s)).   Labs: Basic Metabolic Panel: Recent Labs  Lab 11/04/18 0415 11/05/18 0604 11/06/18 0813 11/09/18 1125  NA 137 135 135 135  K 4.2 3.7 3.6 3.6  CL 107 106 104 106  CO2 19* 21* 25 23  GLUCOSE 77 84 89 98  BUN 9 9 6 9   CREATININE 0.49 0.45 0.42* 0.45  CALCIUM 8.8* 8.7* 8.8* 9.1   Liver Function Tests: Recent Labs  Lab 11/04/18 0415 11/05/18 0604 11/06/18 0813  AST 45* 30 30  ALT  30 23 26   ALKPHOS 89 86 90  BILITOT 0.7 1.1 1.8*  PROT 7.4 6.4* 6.8  ALBUMIN 3.1* 3.0* 3.1*   No results for input(s): LIPASE, AMYLASE in the last 168 hours. No results for input(s): AMMONIA in the last 168 hours. CBC: Recent Labs  Lab 11/04/18 0415 11/06/18 0813 11/07/18 1015 11/09/18 1125  WBC 13.4* 9.6 10.9* 12.7*  NEUTROABS 9.3* 5.8  --  9.6*  HGB 6.9* 8.7* 9.8* 9.7*  HCT 19.4* 25.6* 28.9* 27.2*  MCV 89.4 88.3 87.0 87.5  PLT 571* 542* 583* 541*   Cardiac Enzymes: No results for input(s): CKTOTAL, CKMB, CKMBINDEX, TROPONINI in the last 168 hours. BNP: Invalid input(s): POCBNP CBG: No results for input(s): GLUCAP in the last 168 hours.  Time coordinating discharge: 50 minutes  Signed:  [name]  APRN, MSN, FNP-C [facility] [address] [place], [place] [address] [phone]  [facility] 11/10/2018, 7:20 PM

## 2018-11-15 ENCOUNTER — Encounter: Payer: Self-pay | Admitting: Family Medicine

## 2018-11-15 ENCOUNTER — Ambulatory Visit (INDEPENDENT_AMBULATORY_CARE_PROVIDER_SITE_OTHER): Payer: Medicaid Other | Admitting: Family Medicine

## 2018-11-15 ENCOUNTER — Encounter: Payer: Medicaid Other | Admitting: Obstetrics & Gynecology

## 2018-11-15 VITALS — BP 116/64 | HR 102 | Wt 162.2 lb

## 2018-11-15 DIAGNOSIS — F419 Anxiety disorder, unspecified: Secondary | ICD-10-CM

## 2018-11-15 DIAGNOSIS — O0990 Supervision of high risk pregnancy, unspecified, unspecified trimester: Secondary | ICD-10-CM

## 2018-11-15 DIAGNOSIS — Z3A26 26 weeks gestation of pregnancy: Secondary | ICD-10-CM

## 2018-11-15 DIAGNOSIS — D571 Sickle-cell disease without crisis: Secondary | ICD-10-CM

## 2018-11-15 DIAGNOSIS — O0992 Supervision of high risk pregnancy, unspecified, second trimester: Secondary | ICD-10-CM

## 2018-11-15 NOTE — Patient Instructions (Signed)

## 2018-11-16 NOTE — Progress Notes (Signed)
   PRENATAL VISIT NOTE  Subjective:  Carmen [name] is a 28 y.o. G2P0010 at [redacted]w[redacted]d being seen today for ongoing prenatal care.  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); Vitamin D deficiency; Chronic prescription opiate use; Chronic musculoskeletal pain; Supervision of high risk pregnancy, antepartum; Sickle-cell disease with pain ([place]); Sickle cell anemia with crisis ([place]); and Pain in extremity on their problem list.  Patient reports no complaints.  Contractions: Not present. Vag. Bleeding: None.  Movement: Present. Denies leaking of fluid.   The following portions of the patient's history were reviewed and updated as appropriate: allergies, current medications, past family history, past medical history, past social history, past surgical history and problem list. Problem list updated.  Objective:   Vitals:   11/15/18 1445  BP: 116/64  Pulse: (!) 102  Weight: 162 lb 3.2 oz (73.6 kg)    Fetal Status: Fetal Heart Rate (bpm): 152 Fundal Height: 25 cm Movement: Present     General:  Alert, oriented and cooperative. Patient is in no acute distress.  Skin: Skin is warm and dry. No rash noted.   Cardiovascular: Normal heart rate noted  Respiratory: Normal respiratory effort, no problems with respiration noted  Abdomen: Soft, gravid, appropriate for gestational age.  Pain/Pressure: Present     Pelvic: Cervical exam deferred        Extremities: Normal range of motion.  Edema: None  Mental Status: Normal mood and affect. Normal behavior. Normal judgment and thought content.   Assessment and Plan:  Pregnancy: G2P0010 at [redacted]w[redacted]d  1. Anxiety - Ambulatory  referral to [place]  2. Hb-SS disease without crisis ([place]) Doing well  3. Supervision of high risk pregnancy, antepartum Continue prenatal care.   Preterm labor symptoms and general obstetric precautions including but not limited to vaginal bleeding, contractions, leaking of fluid and fetal movement were reviewed in detail with the patient. Please refer to After Visit Summary for other counseling recommendations.  Return in 2 weeks (on 11/29/2018) for 28 wk labs.  Future Appointments  Date Time Provider [place]  11/29/2018  8:35 AM Anyanwu, Sallyanne Havers, MD [place]  11/29/2018  9:30 AM WOC-WOCA LAB WOC-WOCA WOC  11/30/2018 10:20 AM Lanae Boast, FNP SCC-SCC None  12/01/2018  3:30 PM WH-MFC [place] 5 WH-MFCUS MFC-US    Donnamae Jude, MD

## 2018-11-17 ENCOUNTER — Telehealth: Payer: Self-pay

## 2018-11-17 DIAGNOSIS — Z79891 Long term (current) use of opiate analgesic: Secondary | ICD-10-CM

## 2018-11-17 DIAGNOSIS — D571 Sickle-cell disease without crisis: Secondary | ICD-10-CM

## 2018-11-17 MED ORDER — OXYCODONE HCL 5 MG PO CAPS: 5.0000 mg | ORAL_CAPSULE | ORAL | 0 refills | Status: DC | PRN

## 2018-11-17 NOTE — Telephone Encounter (Signed)
Reviewed [place] Substance Reporting system prior to prescribing opiate medications. No inconsistencies noted.   

## 2018-11-27 ENCOUNTER — Other Ambulatory Visit: Payer: Self-pay | Admitting: *Deleted

## 2018-11-27 DIAGNOSIS — O0990 Supervision of high risk pregnancy, unspecified, unspecified trimester: Secondary | ICD-10-CM

## 2018-11-29 ENCOUNTER — Other Ambulatory Visit: Payer: Medicaid Other

## 2018-11-29 ENCOUNTER — Ambulatory Visit (INDEPENDENT_AMBULATORY_CARE_PROVIDER_SITE_OTHER): Payer: Medicaid Other | Admitting: Obstetrics & Gynecology

## 2018-11-29 ENCOUNTER — Ambulatory Visit (INDEPENDENT_AMBULATORY_CARE_PROVIDER_SITE_OTHER): Payer: Medicaid Other | Admitting: Clinical

## 2018-11-29 VITALS — BP 115/64 | HR 114 | Wt 164.0 lb

## 2018-11-29 DIAGNOSIS — D571 Sickle-cell disease without crisis: Secondary | ICD-10-CM | POA: Diagnosis not present

## 2018-11-29 DIAGNOSIS — O0990 Supervision of high risk pregnancy, unspecified, unspecified trimester: Secondary | ICD-10-CM

## 2018-11-29 DIAGNOSIS — F419 Anxiety disorder, unspecified: Secondary | ICD-10-CM | POA: Diagnosis not present

## 2018-11-29 DIAGNOSIS — Z23 Encounter for immunization: Secondary | ICD-10-CM | POA: Diagnosis not present

## 2018-11-29 DIAGNOSIS — O99343 Other mental disorders complicating pregnancy, third trimester: Secondary | ICD-10-CM

## 2018-11-29 DIAGNOSIS — Z3A28 28 weeks gestation of pregnancy: Secondary | ICD-10-CM | POA: Diagnosis not present

## 2018-11-29 DIAGNOSIS — F4322 Adjustment disorder with anxiety: Secondary | ICD-10-CM | POA: Diagnosis not present

## 2018-11-29 DIAGNOSIS — O99013 Anemia complicating pregnancy, third trimester: Secondary | ICD-10-CM

## 2018-11-29 DIAGNOSIS — O99019 Anemia complicating pregnancy, unspecified trimester: Principal | ICD-10-CM

## 2018-11-29 NOTE — Progress Notes (Signed)
   PRENATAL VISIT NOTE  Subjective:  Carmen [name] is a 28 y.o. G2P0010 at [redacted]w[redacted]d being seen today for ongoing prenatal care.  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  Patient reports no complaints.  Contractions: Not present. Vag. Bleeding: None.  Movement: Present. Denies leaking of fluid.   The following portions of the patient's history were reviewed and updated as appropriate: allergies, current medications, past family history, past medical history, past social history, past surgical history and problem list. Problem list updated.  Objective:   Vitals:   11/29/18 0845  BP: 115/64  Pulse: (!) 114  Weight: 164 lb (74.4 kg)    Fetal Status: Fetal Heart Rate (bpm): 150 Fundal Height: 29 cm Movement: Present     General:  Alert, oriented and cooperative. Patient is in no acute distress.  Skin: Skin is warm and dry. No rash noted.   Cardiovascular: Normal heart rate noted  Respiratory: Normal respiratory effort, no problems with respiration noted  Abdomen: Soft, gravid, appropriate for gestational age.  Pain/Pressure: Present     Pelvic: Cervical exam deferred        Extremities: Normal range of motion.  Edema: None  Mental Status: Normal mood and affect. Normal behavior. Normal judgment and thought content.   Assessment and Plan:  Pregnancy: G2P0010 at [redacted]w[redacted]d  1. Sickle cell anemia of mother during pregnancy [facility]) Doing well - Retic added to her labs today Growth scan scheduled 12/01/17.  2.  Anxiety Will see Roselyn Reef today.  3. Supervision of high risk pregnancy, antepartum Third trimester labs, Tdap today. - Tdap vaccine greater than or equal to 7yo IM Preterm labor symptoms and general obstetric precautions including but not limited to vaginal bleeding, contractions, leaking of fluid and fetal movement were reviewed in detail with the patient. Please refer to After Visit Summary for other counseling recommendations.  Return in about 2 weeks (around 12/13/2018) for OB Visit ([place]).  Future Appointments  Date Time Provider [place]  11/29/2018  9:30 AM WOC-WOCA LAB WOC-WOCA WOC  11/29/2018 10:00 AM [place]  11/30/2018 10:20 AM Lanae Boast, FNP SCC-SCC None  12/01/2018  3:30 PM [place]    Verita Schneiders, MD

## 2018-11-29 NOTE — Patient Instructions (Signed)
Return to clinic for any scheduled appointments or obstetric concerns, or go to MAU for evaluation  

## 2018-11-29 NOTE — BH Specialist Note (Signed)
Integrated Behavioral Health Initial Visit  MRN: [id number] Name: Carmen [name]  Number of [place] Clinician visits:: 1/6 Session Start time: 9:15  Session End time: 9:38 Total time: 20 minutes  Type of Service: [place] Interpretor:No. Interpretor Name and Language: n/a   Warm Hand Off Completed.       SUBJECTIVE: Carmen [name] is a 28 y.o. female accompanied by n/a Patient was referred by Darron Doom, MD for anxiety. Patient reports the following symptoms/concerns: Pt states her primary concern is an increase in anxiety, worry, stress, and fatigue, that she attributes to the unknowns of a first pregnancy.Pt used to use deep breathing and coloring to cope with feelings, but has felt too tired in recent months to use.  Duration of problem: Current pregnancy; Severity of problem: mild  OBJECTIVE: Mood: Normal and Affect: Appropriate Risk of harm to self or others: No plan to harm self or others  LIFE CONTEXT: Family and Social: - School/Work: - Self-Care: self-coping strategies (deep breathing, coloring) Life Changes: Current first pregnancy  GOALS ADDRESSED: Patient will: 1. Reduce symptoms of: anxiety and stress 2. Increase knowledge and/or ability of: healthy habits and self-management skills  3. Demonstrate ability to: Increase healthy adjustment to current life circumstances  INTERVENTIONS: Interventions utilized: Mindfulness or Psychologist, educational, Psychoeducation and/or Health Education and Link to [employer]  Standardized Assessments completed: not given  ASSESSMENT: Patient currently experiencing Adjustment disorder with anxious mood.   Patient may benefit from psychoeducation and brief therapeutic interventions regarding coping with symptoms of anxiety .  PLAN: 1. Follow up with behavioral health clinician on : As needed 2. Behavioral recommendations:  -CALM relaxation breathing at least  twice daily(morning; at bedtime) -Begin using deep breathing and coloring again, when anxiety begins to rise -Read educational materials regarding coping with symptoms of anxiety -Consider apps as additional self-care 3. Referral(s): [place] (In Clinic) and [employer] Resources:  Transportation 4. "From scale of 1-10, how likely are you to follow plan?": 10  Garlan Fair, LCSW   Depression screen [facility] 2/9 11/15/2018 11/02/2018 09/15/2018 08/17/2018 08/17/2018  Decreased Interest 0 0 0 0 0  Down, Depressed, Hopeless 0 0 0 0 0  PHQ - 2 Score 0 0 0 0 0  Altered sleeping 0 - - 0 -  Tired, decreased energy 1 - - 1 -  Change in appetite 0 - - 0 -  Feeling bad or failure about yourself  0 - - 0 -  Trouble concentrating 0 - - 0 -  Moving slowly or fidgety/restless 0 - - 0 -  Suicidal thoughts 0 - - 0 -  PHQ-9 Score 1 - - 1 -  Some recent data might be hidden   GAD 7 : Generalized Anxiety Score 11/15/2018 08/17/2018  Nervous, Anxious, on Edge 0 0  Control/stop worrying 0 0  Worry too much - different things 0 0  Trouble relaxing 0 0  Restless 0 0  Easily annoyed or irritable 1 0  Afraid - awful might happen 0 0  Total GAD 7 Score 1 0

## 2018-11-30 ENCOUNTER — Ambulatory Visit (INDEPENDENT_AMBULATORY_CARE_PROVIDER_SITE_OTHER): Payer: Medicaid Other | Admitting: Family Medicine

## 2018-11-30 VITALS — BP 114/26 | HR 126 | Temp 98.4°F | Resp 14 | Ht 63.0 in | Wt 167.0 lb

## 2018-11-30 DIAGNOSIS — O0990 Supervision of high risk pregnancy, unspecified, unspecified trimester: Secondary | ICD-10-CM

## 2018-11-30 DIAGNOSIS — D571 Sickle-cell disease without crisis: Secondary | ICD-10-CM

## 2018-11-30 LAB — POCT URINALYSIS DIPSTICK
Blood, UA: NEGATIVE
Glucose, UA: NEGATIVE
Ketones, UA: NEGATIVE
Nitrite, UA: NEGATIVE
Protein, UA: NEGATIVE
Spec Grav, UA: 1.01 (ref 1.010–1.025)
Urobilinogen, UA: >=8 E.U./dL — AB
pH, UA: 6.5 (ref 5.0–8.0)

## 2018-11-30 LAB — RETICULOCYTES: RETIC CT PCT: 12.1 % — AB (ref 0.6–2.6)

## 2018-11-30 NOTE — Progress Notes (Signed)
PATIENT CARE CENTER INTERNAL MEDICINE AND SICKLE CELL CARE  SICKLE CELL ANEMIA FOLLOW UP VISIT PROVIDER: Lanae Boast, FNP    Subjective:   Carmen [name]  is a 28 y.o.  female who  has a past medical history of Abortion, Headache(784.0), and Sickle cell crisis ([place]). presents for a follow up for Sickle Cell Anemia. The patient has had 3 admissions in the past 6 months. Patient is [redacted]w[redacted]d pregnant and is followed by high risk OBGYN. Blood work and glucose tolerance test performed yesterday. Results pending.  Patient also followed by LCSW for anxiety. Patient states that this has helped her with coping skills. She is excited about the pregnancy now. Having a boy that she will name Carmen [name].  Pain regimen includes: Oxycodone 5mg  and MS contin 30 q12 h Hydrea Therapy: No Medication compliance: Yes  No pain today. The patient reports adequate daily hydration.     Review of Systems  Constitutional: Negative.   HENT: Negative.   Eyes: Negative.   Respiratory: Negative.   Cardiovascular: Negative.   Gastrointestinal: Negative.   Genitourinary: Negative.   Musculoskeletal: Negative.   Skin: Negative.   Neurological: Negative.   Psychiatric/Behavioral: Negative.     Objective:   Objective  BP (!) 114/26 (BP Location: Right Arm, Patient Position: Sitting, Cuff Size: Normal)   Pulse (!) 126   Temp 98.4 F (36.9 C) (Oral)   Resp 14   Ht 5\' 3"  (1.6 m)   Wt 167 lb (75.8 kg)   LMP 05/15/2018 (Approximate)   SpO2 100%   BMI 29.58 kg/m   Wt Readings from Last 3 Encounters:  11/30/18 167 lb (75.8 kg)  11/29/18 164 lb (74.4 kg)  11/15/18 162 lb 3.2 oz (73.6 kg)     Physical Exam Vitals signs and nursing note reviewed.  Constitutional:      General: She is not in acute distress.    Appearance: She is well-developed.  HENT:     Head: Normocephalic and atraumatic.  Eyes:     Conjunctiva/sclera: Conjunctivae normal.     Pupils: Pupils are equal, round, and reactive to light.    Neck:     Musculoskeletal: Normal range of motion.  Cardiovascular:     Rate and Rhythm: Regular rhythm. Tachycardia present.     Heart sounds: Normal heart sounds.  Pulmonary:     Effort: Pulmonary effort is normal. No respiratory distress.     Breath sounds: Normal breath sounds.  Abdominal:     General: Bowel sounds are normal. There is no distension.     Palpations: Abdomen is soft.  Musculoskeletal: Normal range of motion.  Skin:    General: Skin is warm and dry.  Neurological:     Mental Status: She is alert and oriented to person, place, and time.  Psychiatric:        Mood and Affect: Mood normal.        Behavior: Behavior normal.        Thought Content: Thought content normal.        Judgment: Judgment normal.      Assessment/Plan:   Assessment   Encounter Diagnosis  Name Primary?  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 Hb-SS disease without crisis ([place]) No medication changes warranted at the present time.   - Urinalysis Dipstick  2. High risk pregnancy, antepartum Patient is being followed by a specialist for this condition. Patient advised to continue with follow up appointments. PCP will  continue to monitor progress.       Return to care as scheduled and prn. Patient verbalized understanding and agreed with plan of care.   1. Sickle cell disease -   We discussed the need for good hydration, monitoring of hydration status, avoidance of heat, cold, stress, and infection triggers. We discussed the risks and benefits of Hydrea, including bone marrow suppression, the possibility of GI upset, skin ulcers, hair thinning, and teratogenicity. The patient was reminded of the need to seek medical attention of any symptoms of bleeding, anemia, or infection. Continue folic acid 1 mg daily to prevent aplastic bone marrow crises.   2. Pulmonary evaluation - Patient denies severe recurrent wheezes, shortness of breath with exercise, or persistent cough. If  these symptoms develop, pulmonary function tests with spirometry will be ordered, and if abnormal, plan on referral to Pulmonology for further evaluation.  3. Cardiac - Routine screening for pulmonary hypertension is not recommended.  4. Eye - High risk of proliferative retinopathy. Annual eye exam with retinal exam recommended to patient.  5. Immunization status -  Yearly influenza vaccination is recommended, as well as being up to date with Meningococcal and Pneumococcal vaccines.   6. Acute and chronic painful episodes - We discussed that pt is to receive Schedule II prescriptions only from [place]. Pt is also aware that the prescription history is available to [place] online through the [facility] CSRS. Controlled substance agreement signed. We reminded Carmen [name] that all patients receiving Schedule II narcotics must be seen for follow within one month of prescription being requested. We reviewed the terms of our pain agreement, including the need to keep medicines in a safe locked location away from children or pets, and the need to report excess sedation or constipation, measures to avoid constipation, and policies related to early refills and stolen prescriptions. According to the [place] [facility] program, we have reviewed details related to analgesia, adverse effects, aberrant behaviors.  7. Iron overload from chronic transfusion.  Not applicable at this time.  If this occurs will use Exjade for management.   8. Vitamin D deficiency - Drisdol 50,000 units weekly. Patient encouraged to take as prescribed.   The above recommendations are taken from the NIH Evidence-Based Management of Sickle Cell Disease: Expert Panel Report, 20149.   [name], FNP-BC Patient [facility] [address] [place], [place] [address] [phone]  This note has been created with Dragon speech recognition software and smart phrase technology. Any transcriptional errors are  unintentional.

## 2018-11-30 NOTE — Patient Instructions (Signed)
Sickle Cell Anemia, Adult °Sickle cell anemia is a condition where your red blood cells are shaped like sickles. Red blood cells carry oxygen through the body. Sickle-shaped cells do not live as long as normal red blood cells. They also clump together and block blood from flowing through the blood vessels. This prevents the body from getting enough oxygen. Sickle cell anemia causes organ damage and pain. It also increases the risk of infection. °Follow these instructions at home: °Medicines °· Take over-the-counter and prescription medicines only as told by your doctor. °· If you were prescribed an antibiotic medicine, take it as told by your doctor. Do not stop taking the antibiotic even if you start to feel better. °· If you develop a fever, do not take medicines to lower the fever right away. Tell your doctor about the fever. °Managing pain, stiffness, and swelling °· Try these methods to help with pain: °? Use a heating pad. °? Take a warm bath. °? Distract yourself, such as by watching TV. °Eating and drinking °· Drink enough fluid to keep your pee (urine) clear or pale yellow. Drink more in hot weather and during exercise. °· Limit or avoid alcohol. °· Eat a healthy diet. Eat plenty of fruits, vegetables, whole grains, and lean protein. °· Take vitamins and supplements as told by your doctor. °Traveling °· When traveling, keep these with you: °? Your medical information. °? The names of your doctors. °? Your medicines. °· If you need to take an airplane, talk to your doctor first. °Activity °· Rest often. °· Avoid exercises that make your heart beat much faster, such as jogging. °General instructions °· Do not use products that have nicotine or tobacco, such as cigarettes and e-cigarettes. If you need help quitting, ask your doctor. °· Consider wearing a medical alert bracelet. °· Avoid being in high places (high altitudes), such as mountains. °· Avoid very hot or cold temperatures. °· Avoid places where the  temperature changes a lot. °· Keep all follow-up visits as told by your doctor. This is important. °Contact a doctor if: °· A joint hurts. °· Your feet or hands hurt or swell. °· You feel tired (fatigued). °Get help right away if: °· You have symptoms of infection. These include: °? Fever. °? Chills. °? Being very tired. °? Irritability. °? Poor eating. °? Throwing up (vomiting). °· You feel dizzy or faint. °· You have new stomach pain, especially on the left side. °· You have a an erection (priapism) that lasts more than 4 hours. °· You have numbness in your arms or legs. °· You have a hard time moving your arms or legs. °· You have trouble talking. °· You have pain that does not go away when you take medicine. °· You are short of breath. °· You are breathing fast. °· You have a long-term cough. °· You have pain in your chest. °· You have a bad headache. °· You have a stiff neck. °· Your stomach looks bloated even though you did not eat much. °· Your skin is pale. °· You suddenly cannot see well. °Summary °· Sickle cell anemia is a condition where your red blood cells are shaped like sickles. °· Follow your doctor's advice on ways to manage pain, food to eat, activities to do, and steps to take for safe travel. °· Get medical help right away if you have any signs of infection, such as a fever. °This information is not intended to replace advice given to you by your   health care provider. Make sure you discuss any questions you have with your health care provider. °Document Released: 08/08/2013 Document Revised: 11/23/2016 Document Reviewed: 11/23/2016 °Elsevier Interactive Patient Education © 2019 [employer]vier Inc. ° °

## 2018-12-01 ENCOUNTER — Ambulatory Visit (HOSPITAL_COMMUNITY)
Admission: RE | Admit: 2018-12-01 | Discharge: 2018-12-01 | Disposition: A | Discharge status: Home / Self Care | Payer: Medicaid Other | Source: Ambulatory Visit | Attending: Obstetrics and Gynecology | Admitting: Obstetrics and Gynecology

## 2018-12-01 ENCOUNTER — Encounter: Payer: Self-pay | Admitting: Obstetrics & Gynecology

## 2018-12-01 DIAGNOSIS — Z3A28 28 weeks gestation of pregnancy: Secondary | ICD-10-CM

## 2018-12-01 DIAGNOSIS — Z362 Encounter for other antenatal screening follow-up: Secondary | ICD-10-CM

## 2018-12-01 DIAGNOSIS — D571 Sickle-cell disease without crisis: Secondary | ICD-10-CM | POA: Diagnosis not present

## 2018-12-01 DIAGNOSIS — O0990 Supervision of high risk pregnancy, unspecified, unspecified trimester: Secondary | ICD-10-CM | POA: Diagnosis present

## 2018-12-01 DIAGNOSIS — O98113 Syphilis complicating pregnancy, third trimester: Secondary | ICD-10-CM | POA: Insufficient documentation

## 2018-12-01 DIAGNOSIS — O99323 Drug use complicating pregnancy, third trimester: Secondary | ICD-10-CM

## 2018-12-01 DIAGNOSIS — O99013 Anemia complicating pregnancy, third trimester: Secondary | ICD-10-CM | POA: Diagnosis not present

## 2018-12-01 HISTORY — DX: Syphilis, unspecified: A53.9

## 2018-12-01 LAB — CBC
Hematocrit: 21.8 % — ABNORMAL LOW (ref 34.0–46.6)
Hemoglobin: 7.5 g/dL — ABNORMAL LOW (ref 11.1–15.9)
MCH: 31.6 pg (ref 26.6–33.0)
MCHC: 34.4 g/dL (ref 31.5–35.7)
MCV: 92 fL (ref 79–97)
NRBC: 3 % — ABNORMAL HIGH (ref 0–0)
PLATELETS: 582 10*3/uL — AB (ref 150–450)
RBC: 2.37 x10E6/uL — CL (ref 3.77–5.28)
RDW: 15.5 % — ABNORMAL HIGH (ref 11.7–15.4)
WBC: 16.8 10*3/uL — ABNORMAL HIGH (ref 3.4–10.8)

## 2018-12-01 LAB — RPR, QUANT+TP ABS (REFLEX)
Rapid Plasma Reagin, Quant: 1:1 {titer} — ABNORMAL HIGH
T Pallidum Abs: REACTIVE — AB

## 2018-12-01 LAB — GLUCOSE TOLERANCE, 2 HOURS W/ 1HR
GLUCOSE, 1 HOUR: 107 mg/dL (ref 65–179)
GLUCOSE, 2 HOUR: 79 mg/dL (ref 65–152)
Glucose, Fasting: 83 mg/dL (ref 65–91)

## 2018-12-01 LAB — HIV ANTIBODY (ROUTINE TESTING W REFLEX): HIV Screen 4th Generation wRfx: NONREACTIVE

## 2018-12-01 LAB — RPR: RPR Ser Ql: REACTIVE — AB

## 2018-12-01 NOTE — Progress Notes (Signed)
Was diagnosed with syphilis in 2015 as per patient, and received one injection of antibiotic therapy at [facility] 08/17/2018  RPR NR 11/29/2018 RPR reactive with titer 1:1, T. Pallidium reactive  Talked to Karolee Ohs, MD (infectious Disease), no need for re-treatment.

## 2018-12-03 ENCOUNTER — Encounter (HOSPITAL_COMMUNITY): Payer: Self-pay

## 2018-12-03 ENCOUNTER — Inpatient Hospital Stay (HOSPITAL_COMMUNITY)
Admission: EM | Admit: 2018-12-03 | Discharge: 2018-12-13 | DRG: 831 | Disposition: A | Discharge status: Home / Self Care | Payer: Medicaid Other | Attending: Internal Medicine | Admitting: Internal Medicine

## 2018-12-03 DIAGNOSIS — Z8249 Family history of ischemic heart disease and other diseases of the circulatory system: Secondary | ICD-10-CM

## 2018-12-03 DIAGNOSIS — G894 Chronic pain syndrome: Secondary | ICD-10-CM | POA: Diagnosis present

## 2018-12-03 DIAGNOSIS — Z832 Family history of diseases of the blood and blood-forming organs and certain disorders involving the immune mechanism: Secondary | ICD-10-CM

## 2018-12-03 DIAGNOSIS — Z8619 Personal history of other infectious and parasitic diseases: Secondary | ICD-10-CM

## 2018-12-03 DIAGNOSIS — O0990 Supervision of high risk pregnancy, unspecified, unspecified trimester: Secondary | ICD-10-CM

## 2018-12-03 DIAGNOSIS — D571 Sickle-cell disease without crisis: Secondary | ICD-10-CM | POA: Diagnosis present

## 2018-12-03 DIAGNOSIS — O09893 Supervision of other high risk pregnancies, third trimester: Secondary | ICD-10-CM

## 2018-12-03 DIAGNOSIS — Z79891 Long term (current) use of opiate analgesic: Secondary | ICD-10-CM

## 2018-12-03 DIAGNOSIS — D638 Anemia in other chronic diseases classified elsewhere: Secondary | ICD-10-CM | POA: Diagnosis present

## 2018-12-03 DIAGNOSIS — D72829 Elevated white blood cell count, unspecified: Secondary | ICD-10-CM | POA: Diagnosis present

## 2018-12-03 DIAGNOSIS — D5700 Hb-SS disease with crisis, unspecified: Secondary | ICD-10-CM | POA: Diagnosis present

## 2018-12-03 DIAGNOSIS — E876 Hypokalemia: Secondary | ICD-10-CM | POA: Diagnosis not present

## 2018-12-03 DIAGNOSIS — O212 Late vomiting of pregnancy: Secondary | ICD-10-CM | POA: Diagnosis present

## 2018-12-03 DIAGNOSIS — Z79899 Other long term (current) drug therapy: Secondary | ICD-10-CM

## 2018-12-03 DIAGNOSIS — O99323 Drug use complicating pregnancy, third trimester: Secondary | ICD-10-CM | POA: Diagnosis present

## 2018-12-03 DIAGNOSIS — Z7982 Long term (current) use of aspirin: Secondary | ICD-10-CM

## 2018-12-03 DIAGNOSIS — O99013 Anemia complicating pregnancy, third trimester: Principal | ICD-10-CM | POA: Diagnosis present

## 2018-12-03 DIAGNOSIS — Z841 Family history of disorders of kidney and ureter: Secondary | ICD-10-CM

## 2018-12-03 DIAGNOSIS — Z9081 Acquired absence of spleen: Secondary | ICD-10-CM

## 2018-12-03 DIAGNOSIS — Z3A29 29 weeks gestation of pregnancy: Secondary | ICD-10-CM

## 2018-12-03 NOTE — ED Provider Notes (Addendum)
[facility] DEPT Provider Note   CSN: [id number] Arrival date & time: 12/03/18  2342     History   Chief Complaint Chief Complaint  Patient presents with  . Sickle Cell Pain Crisis    Pregnant    HPI Carmen [name] is a 28 y.o. female.  HPI   28 yo F with h/o Sickle cell disease, currently [redacted] wk pregnant, here with diffuse pain. Pt reports gradual onset of diffuse arm, leg, back pain x 1 week. Sx worse over past several days. She's been taking her oxycodone and morphine w/o relief. Pain is similar to her usual crises. She's also had some DOE and faintness upons tanding. She was told to come ot the ED by her OB last week due to anemia, but felt better. No fever. No VB, LOF. She is feeling baby move. No contractions. No fever, cough, CP, SOB. No other complaints.  Past Medical History:  Diagnosis Date  . Abortion    05/2012  . Headache(784.0)   . Sickle cell crisis ([place])   . Syphilis 2015   Was diagnosed and received one injection of antibiotics    Patient Active Problem List   Diagnosis Date Noted  . 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 Pain in extremity   . Supervision of high risk pregnancy, antepartum 08/17/2018  . Chronic prescription opiate use 03/13/2018  . Chronic musculoskeletal pain 03/13/2018  . Vitamin D deficiency 01/13/2018  . Hypokalemia 06/27/2017  . 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 Hb-SS disease without crisis ([place]) 08/15/2016  . Anemia of chronic disease   . S/P laparoscopic cholecystectomy 11/30/2014  . Thrombocytosis ([place]) 11/22/2014  . Systolic ejection murmur 00/93/8182  . Sickle cell anemia of mother during pregnancy [facility]) 05/16/2014    Past Surgical History:  Procedure Laterality Date  . CHOLECYSTECTOMY N/A 11/30/2014   Procedure: LAPAROSCOPIC CHOLECYSTECTOMY SINGLE SITE WITH INTRAOPERATIVE CHOLANGIOGRAM;  Surgeon: Michael Boston, MD;  Location: [facility] ORS;  Service: General;   Laterality: N/A;  . SPLENECTOMY       OB History    Gravida  2   Para      Term      Preterm      AB  1   Living  0     SAB      TAB  1   Ectopic      Multiple      Live Births               Home Medications    Prior to Admission medications   Medication Sig Start Date End Date Taking? Authorizing Provider  aspirin EC 81 MG tablet Take 1 tablet (81 mg total) by mouth daily. 08/17/18   [name], MD  ergocalciferol (VITAMIN D2) 50000 units capsule Take 1 capsule (50,000 Units total) by mouth once a week. 05/22/18   Tresa Garter, MD  folic acid (FOLVITE) 1 MG tablet Take 1 tablet (1 mg total) by mouth daily. 08/17/18   [name], MD  morphine (MS CONTIN) 30 MG 12 hr tablet Take 1 tablet (30 mg total) by mouth every 8 (eight) hours as needed for pain. 11/02/18   Lanae Boast, FNP  Nutritional Supplements (ENSURE HEALTHY MOM) LIQD Drink 1 pouch BID Patient not taking: Reported on 11/29/2018 10/18/18   Emily Filbert, MD  oxyCODONE (OXY IR/ROXICODONE) 5 MG immediate release tablet Take 5 mg by mouth every 4 (four)  hours as needed. 10/31/18   [provider]  Prenatal Vit-Fe Fumarate-FA (PREPLUS) 27-1 MG TABS Take 1 tablet by mouth daily. 08/17/18   [name], MD    Family History Family History  Problem Relation Age of Onset  . Hypertension Mother   . Sickle cell anemia Sister   . Kidney disease Sister        Lupus  . Arthritis Sister   . Sickle cell anemia Sister   . Sickle cell trait Sister   . Heart disease Maternal Aunt        CABG  . Heart disease Maternal Uncle        CABG  . Lupus Sister     Social History Social History   Tobacco Use  . Smoking status: Never Smoker  . Smokeless tobacco: Never Used  Substance Use Topics  . Alcohol use: No  . Drug use: No     Allergies   Patient has no known allergies.   Review of Systems Review of Systems  Constitutional: Negative for chills, fatigue and fever.    HENT: Negative for congestion and rhinorrhea.   Eyes: Negative for visual disturbance.  Respiratory: Negative for cough, shortness of breath and wheezing.   Cardiovascular: Negative for chest pain and leg swelling.  Gastrointestinal: Negative for abdominal pain, diarrhea, nausea and vomiting.  Genitourinary: Negative for dysuria and flank pain.  Musculoskeletal: Positive for arthralgias and myalgias. Negative for neck pain and neck stiffness.  Skin: Negative for rash and wound.  Allergic/Immunologic: Negative for immunocompromised state.  Neurological: Positive for weakness. Negative for syncope and headaches.  All other systems reviewed and are negative.    Physical Exam Updated Vital Signs BP (!) 103/55   Pulse 84   Temp 98.3 F (36.8 C) (Oral)   Resp 17   Ht 5\' 3"  (1.6 m)   Wt 77 kg   LMP 05/15/2018 (Approximate)   SpO2 98%   BMI 30.07 kg/m   Physical Exam Vitals signs and nursing note reviewed.  Constitutional:      General: She is not in acute distress.    Appearance: She is well-developed.  HENT:     Head: Normocephalic and atraumatic.  Eyes:     Conjunctiva/sclera: Conjunctivae normal.  Neck:     Musculoskeletal: Neck supple.  Cardiovascular:     Rate and Rhythm: Normal rate and regular rhythm.     Heart sounds: Normal heart sounds. No murmur. No friction rub.  Pulmonary:     Effort: Pulmonary effort is normal. No respiratory distress.     Breath sounds: Normal breath sounds. No wheezing or rales.  Abdominal:     General: There is no distension.     Palpations: Abdomen is soft.     Tenderness: There is no abdominal tenderness.     Comments: Gravid, soft, NT, ND.  Musculoskeletal:     Comments: No midline or paraspinal TTP.  Skin:    General: Skin is warm.     Capillary Refill: Capillary refill takes less than 2 seconds.  Neurological:     Mental Status: She is alert and oriented to person, place, and time.     Motor: No abnormal muscle tone.       ED Treatments / Results  Labs (all labs ordered are listed, but only abnormal results are displayed) Labs Reviewed  CBC WITH DIFFERENTIAL/PLATELET - Abnormal; Notable for the following components:      Result Value   WBC 16.2 (*)  RBC 2.27 (*)    Hemoglobin 7.4 (*)    HCT 21.3 (*)    RDW 17.1 (*)    Platelets 522 (*)    nRBC 9.3 (*)    nRBC 9 (*)    Neutro Abs 12.2 (*)    Monocytes Absolute 1.5 (*)    All other components within normal limits  COMPREHENSIVE METABOLIC PANEL - Abnormal; Notable for the following components:   Potassium 3.4 (*)    Chloride 112 (*)    CO2 21 (*)    Calcium 8.8 (*)    Albumin 2.9 (*)    Alkaline Phosphatase 158 (*)    Total Bilirubin 1.3 (*)    All other components within normal limits  RETICULOCYTES - Abnormal; Notable for the following components:   Retic Ct Pct 13.0 (*)    RBC. 2.27 (*)    Retic Count, Absolute 296.0 (*)    Immature Retic Fract 49.7 (*)    All other components within normal limits  TYPE AND SCREEN  PREPARE RBC (CROSSMATCH)    EKG None  Radiology No results found.  Procedures .Critical Care Performed by: Duffy Bruce, MD Authorized by: Duffy Bruce, MD   Critical care provider statement:    Critical care time (minutes):  35   Critical care time was exclusive of:  Separately billable procedures and treating other patients and teaching time   Critical care was necessary to treat or prevent imminent or life-threatening deterioration of the following conditions:  Circulatory failure and cardiac failure   Critical care was time spent personally by me on the following activities:  Development of treatment plan with patient or surrogate, discussions with consultants, evaluation of patient's response to treatment, examination of patient, obtaining history from patient or surrogate, ordering and performing treatments and interventions, ordering and review of laboratory studies, ordering and review of  radiographic studies, pulse oximetry, re-evaluation of patient's condition and review of old charts   I assumed direction of critical care for this patient from another provider in my specialty: no     (including critical care time)  Medications Ordered in ED Medications  HYDROmorphone (DILAUDID) injection 2 mg (has no administration in time range)    Or  HYDROmorphone (DILAUDID) injection 2 mg (has no administration in time range)  ondansetron (ZOFRAN) injection 4 mg (4 mg Intravenous Given 12/04/18 0131)  0.45 % sodium chloride infusion ( Intravenous New Bag/Given 12/04/18 0212)  0.9 %  sodium chloride infusion (Manually program via Guardrails IV Fluids) (has no administration in time range)  HYDROmorphone (DILAUDID) injection 2 mg (2 mg Intravenous Given 12/04/18 0131)    Or  HYDROmorphone (DILAUDID) injection 2 mg ( Subcutaneous See Alternative 12/04/18 0131)  HYDROmorphone (DILAUDID) injection 2 mg (2 mg Intravenous Given 12/04/18 0209)    Or  HYDROmorphone (DILAUDID) injection 2 mg ( Subcutaneous See Alternative 12/04/18 0209)  HYDROmorphone (DILAUDID) injection 2 mg (2 mg Intravenous Given 12/04/18 0247)    Or  HYDROmorphone (DILAUDID) injection 2 mg ( Subcutaneous See Alternative 12/04/18 0247)     Initial Impression / Assessment and Plan / ED Course  I have reviewed the triage vital signs and the nursing notes.  Pertinent labs & imaging results that were available during my care of the patient were reviewed by me and considered in my medical decision making (see chart for details).     28 yo F HbSS female here w/ diffuse pain. Currently [redacted] wk pregnant - FHR, monitoring is reassuring here. Labs do  show acute on chronic anemia, and she has sx of anemia. Will transfuse, admit. Pt has persistent pain despite 3 doses of pain meds so will need admission for pain control as well. No fever, CP, SOB, or signs of acute chest or infection.   D/w Dr. Ihor Dow of OBGYN - she recommends admission  to [facility], [place] will consult in AM. Discussed again with Dr. Hal Hope who is hesitant to admit given pregnancy status. He recommends tx in ED and discussion with sickle cell team in morning.  Final Clinical Impressions(s) / ED Diagnoses   Final diagnoses:  Sickle cell pain crisis ([place])     Duffy Bruce, MD 12/04/18 [phone]

## 2018-12-03 NOTE — ED Notes (Signed)
Bed: IO03 Expected date:  Expected time:  Means of arrival:  Comments: EMS 28 yo female sickle cell Hgb 6 [redacted] weeks pregnant

## 2018-12-03 NOTE — ED Triage Notes (Signed)
Sickle cell pain in arms and legs. Pt states her home medication at 5pm no relief. [redacted] weeks pregnant.

## 2018-12-04 ENCOUNTER — Other Ambulatory Visit (HOSPITAL_COMMUNITY): Payer: Self-pay | Admitting: *Deleted

## 2018-12-04 ENCOUNTER — Other Ambulatory Visit: Payer: Self-pay

## 2018-12-04 ENCOUNTER — Encounter (HOSPITAL_COMMUNITY): Payer: Self-pay | Admitting: Emergency Medicine

## 2018-12-04 DIAGNOSIS — D5700 Hb-SS disease with crisis, unspecified: Secondary | ICD-10-CM | POA: Diagnosis present

## 2018-12-04 DIAGNOSIS — Z3A29 29 weeks gestation of pregnancy: Secondary | ICD-10-CM | POA: Diagnosis not present

## 2018-12-04 DIAGNOSIS — Z79891 Long term (current) use of opiate analgesic: Secondary | ICD-10-CM | POA: Diagnosis not present

## 2018-12-04 DIAGNOSIS — D571 Sickle-cell disease without crisis: Secondary | ICD-10-CM | POA: Diagnosis present

## 2018-12-04 DIAGNOSIS — Z8619 Personal history of other infectious and parasitic diseases: Secondary | ICD-10-CM | POA: Diagnosis not present

## 2018-12-04 DIAGNOSIS — O99323 Drug use complicating pregnancy, third trimester: Secondary | ICD-10-CM | POA: Diagnosis present

## 2018-12-04 DIAGNOSIS — O09893 Supervision of other high risk pregnancies, third trimester: Secondary | ICD-10-CM | POA: Diagnosis not present

## 2018-12-04 DIAGNOSIS — E876 Hypokalemia: Secondary | ICD-10-CM | POA: Diagnosis not present

## 2018-12-04 DIAGNOSIS — Z9081 Acquired absence of spleen: Secondary | ICD-10-CM | POA: Diagnosis not present

## 2018-12-04 DIAGNOSIS — D638 Anemia in other chronic diseases classified elsewhere: Secondary | ICD-10-CM | POA: Diagnosis not present

## 2018-12-04 DIAGNOSIS — D72829 Elevated white blood cell count, unspecified: Secondary | ICD-10-CM | POA: Diagnosis present

## 2018-12-04 DIAGNOSIS — O99013 Anemia complicating pregnancy, third trimester: Secondary | ICD-10-CM | POA: Diagnosis present

## 2018-12-04 DIAGNOSIS — D563 Thalassemia minor: Secondary | ICD-10-CM | POA: Diagnosis not present

## 2018-12-04 DIAGNOSIS — Z832 Family history of diseases of the blood and blood-forming organs and certain disorders involving the immune mechanism: Secondary | ICD-10-CM | POA: Diagnosis not present

## 2018-12-04 DIAGNOSIS — Z7982 Long term (current) use of aspirin: Secondary | ICD-10-CM | POA: Diagnosis not present

## 2018-12-04 DIAGNOSIS — Z841 Family history of disorders of kidney and ureter: Secondary | ICD-10-CM | POA: Diagnosis not present

## 2018-12-04 DIAGNOSIS — O0990 Supervision of high risk pregnancy, unspecified, unspecified trimester: Secondary | ICD-10-CM

## 2018-12-04 DIAGNOSIS — Z8249 Family history of ischemic heart disease and other diseases of the circulatory system: Secondary | ICD-10-CM | POA: Diagnosis not present

## 2018-12-04 DIAGNOSIS — O212 Late vomiting of pregnancy: Secondary | ICD-10-CM | POA: Diagnosis present

## 2018-12-04 DIAGNOSIS — G894 Chronic pain syndrome: Secondary | ICD-10-CM | POA: Diagnosis present

## 2018-12-04 DIAGNOSIS — Z79899 Other long term (current) drug therapy: Secondary | ICD-10-CM | POA: Diagnosis not present

## 2018-12-04 LAB — CBC WITH DIFFERENTIAL/PLATELET
Band Neutrophils: 0 %
Basophils Absolute: 0 10*3/uL (ref 0.0–0.1)
Basophils Relative: 0 %
Blasts: 0 %
Eosinophils Absolute: 0.2 10*3/uL (ref 0.0–0.5)
Eosinophils Relative: 1 %
HCT: 21.3 % — ABNORMAL LOW (ref 36.0–46.0)
Hemoglobin: 7.4 g/dL — ABNORMAL LOW (ref 12.0–15.0)
Lymphocytes Relative: 14 %
Lymphs Abs: 2.3 10*3/uL (ref 0.7–4.0)
MCH: 32.6 pg (ref 26.0–34.0)
MCHC: 34.7 g/dL (ref 30.0–36.0)
MCV: 93.8 fL (ref 80.0–100.0)
Metamyelocytes Relative: 0 %
Monocytes Absolute: 1.5 10*3/uL — ABNORMAL HIGH (ref 0.1–1.0)
Monocytes Relative: 9 %
Myelocytes: 0 %
NEUTROS ABS: 12.2 10*3/uL — AB (ref 1.7–7.7)
Neutrophils Relative %: 76 %
Other: 0 %
Platelets: 522 10*3/uL — ABNORMAL HIGH (ref 150–400)
Promyelocytes Relative: 0 %
RBC: 2.27 MIL/uL — ABNORMAL LOW (ref 3.87–5.11)
RDW: 17.1 % — ABNORMAL HIGH (ref 11.5–15.5)
WBC: 16.2 10*3/uL — ABNORMAL HIGH (ref 4.0–10.5)
nRBC: 9 /100 WBC — ABNORMAL HIGH
nRBC: 9.3 % — ABNORMAL HIGH (ref 0.0–0.2)

## 2018-12-04 LAB — COMPREHENSIVE METABOLIC PANEL
ALT: 30 U/L (ref 0–44)
ANION GAP: 8 (ref 5–15)
AST: 31 U/L (ref 15–41)
Albumin: 2.9 g/dL — ABNORMAL LOW (ref 3.5–5.0)
Alkaline Phosphatase: 158 U/L — ABNORMAL HIGH (ref 38–126)
BUN: 8 mg/dL (ref 6–20)
CO2: 21 mmol/L — ABNORMAL LOW (ref 22–32)
Calcium: 8.8 mg/dL — ABNORMAL LOW (ref 8.9–10.3)
Chloride: 112 mmol/L — ABNORMAL HIGH (ref 98–111)
Creatinine, Ser: 0.5 mg/dL (ref 0.44–1.00)
GFR calc Af Amer: >60 mL/min (ref >60)
GFR calc non Af Amer: >60 mL/min (ref >60)
Glucose, Bld: 85 mg/dL (ref 70–99)
POTASSIUM: 3.4 mmol/L — AB (ref 3.5–5.1)
Sodium: 141 mmol/L (ref 135–145)
Total Bilirubin: 1.3 mg/dL — ABNORMAL HIGH (ref 0.3–1.2)
Total Protein: 7.2 g/dL (ref 6.5–8.1)

## 2018-12-04 LAB — PREPARE RBC (CROSSMATCH)

## 2018-12-04 LAB — RETICULOCYTES
Immature Retic Fract: 49.7 % — ABNORMAL HIGH (ref 2.3–15.9)
RBC.: 2.27 MIL/uL — ABNORMAL LOW (ref 3.87–5.11)
Retic Count, Absolute: 296 10*3/uL — ABNORMAL HIGH (ref 19.0–186.0)
Retic Ct Pct: 13 % — ABNORMAL HIGH (ref 0.4–3.1)

## 2018-12-04 MED ORDER — NALOXONE HCL 0.4 MG/ML IJ SOLN: 0.4000 mg | INTRAMUSCULAR | Status: DC | PRN

## 2018-12-04 MED ORDER — SODIUM CHLORIDE 0.9% IV SOLUTION: Freq: Once | INTRAVENOUS | Status: DC

## 2018-12-04 MED ORDER — HYDROMORPHONE 1 MG/ML IV SOLN
INTRAVENOUS | Status: DC
  Administered 2018-12-04: 2.5 mg via INTRAVENOUS
  Administered 2018-12-04: 5 mg via INTRAVENOUS
  Administered 2018-12-04: 4 mg via INTRAVENOUS
  Administered 2018-12-04: 3.5 mg via INTRAVENOUS
  Administered 2018-12-04: 30 mg via INTRAVENOUS
  Administered 2018-12-05 (×2): 3 mg via INTRAVENOUS
  Administered 2018-12-05: 3.5 mg via INTRAVENOUS
  Administered 2018-12-05: 6.5 mg via INTRAVENOUS
  Administered 2018-12-05: 5.5 mg via INTRAVENOUS
  Administered 2018-12-05: 30 mg via INTRAVENOUS
  Administered 2018-12-05: 4 mg via INTRAVENOUS
  Administered 2018-12-06: 2.5 mg via INTRAVENOUS
  Administered 2018-12-06: 30 mg via INTRAVENOUS
  Administered 2018-12-06: 3.5 mg via INTRAVENOUS
  Administered 2018-12-06: 9.5 mg via INTRAVENOUS
  Administered 2018-12-06: 6 mg via INTRAVENOUS
  Administered 2018-12-07: 5 mg via INTRAVENOUS
  Administered 2018-12-07: 2.5 mg via INTRAVENOUS
  Administered 2018-12-07: 6 mg via INTRAVENOUS
  Administered 2018-12-07: 3 mg via INTRAVENOUS
  Administered 2018-12-07: 6 mg via INTRAVENOUS
  Administered 2018-12-07: 30 mg via INTRAVENOUS
  Administered 2018-12-07: 1 mg via INTRAVENOUS
  Administered 2018-12-08: 8.5 mg via INTRAVENOUS
  Administered 2018-12-08: 6.5 mg via INTRAVENOUS
  Administered 2018-12-08: 2.5 mg via INTRAVENOUS
  Administered 2018-12-08: 30 mg via INTRAVENOUS
  Administered 2018-12-08: 4.5 mg via INTRAVENOUS
  Administered 2018-12-08: 5.5 mg via INTRAVENOUS
  Administered 2018-12-08: 9 mg via INTRAVENOUS
  Administered 2018-12-08: 1.5 mg via INTRAVENOUS
  Administered 2018-12-09: 1 mg via INTRAVENOUS
  Administered 2018-12-09: 30 mg via INTRAVENOUS
  Administered 2018-12-09: 5.5 mg via INTRAVENOUS
  Administered 2018-12-09: 8 mg via INTRAVENOUS
  Administered 2018-12-09: 5.5 mg via INTRAVENOUS
  Administered 2018-12-09: 4.5 mg via INTRAVENOUS
  Administered 2018-12-10: 11.5 mg via INTRAVENOUS
  Administered 2018-12-10: 6 mg via INTRAVENOUS
  Administered 2018-12-10: 30 mg via INTRAVENOUS
  Administered 2018-12-10: 7 mg via INTRAVENOUS
  Administered 2018-12-10: 30 mg via INTRAVENOUS
  Administered 2018-12-10 (×2): 8.5 mg via INTRAVENOUS
  Administered 2018-12-10: 6 mg via INTRAVENOUS
  Administered 2018-12-10: 5.5 mg via INTRAVENOUS
  Administered 2018-12-11: 5 mg via INTRAVENOUS
  Administered 2018-12-11: 12 mg via INTRAVENOUS
  Filled 2018-12-04 (×8): qty 30

## 2018-12-04 MED ORDER — HYDROMORPHONE HCL 2 MG/ML IJ SOLN: 2.0000 mg | INTRAMUSCULAR | Status: AC

## 2018-12-04 MED ORDER — SODIUM CHLORIDE 0.9% FLUSH: 9.0000 mL | INTRAVENOUS | Status: DC | PRN

## 2018-12-04 MED ORDER — POLYETHYLENE GLYCOL 3350 17 G PO PACK: 17.0000 g | PACK | Freq: Every day | ORAL | Status: DC | PRN

## 2018-12-04 MED ORDER — HYDROMORPHONE HCL 2 MG/ML IJ SOLN
2.0000 mg | INTRAMUSCULAR | Status: AC
  Administered 2018-12-04: 2 mg via INTRAVENOUS
  Filled 2018-12-04: qty 1

## 2018-12-04 MED ORDER — ONDANSETRON HCL 4 MG/2ML IJ SOLN
4.0000 mg | INTRAMUSCULAR | Status: DC | PRN
  Administered 2018-12-04: 4 mg via INTRAVENOUS
  Filled 2018-12-04: qty 2

## 2018-12-04 MED ORDER — MORPHINE SULFATE ER 30 MG PO TBCR
30.0000 mg | EXTENDED_RELEASE_TABLET | Freq: Two times a day (BID) | ORAL | Status: DC
  Administered 2018-12-04 – 2018-12-13 (×19): 30 mg via ORAL
  Filled 2018-12-04 (×19): qty 1

## 2018-12-04 MED ORDER — PRENATAL MULTIVITAMIN CH
1.0000 | ORAL_TABLET | Freq: Every day | ORAL | Status: DC
  Administered 2018-12-05 – 2018-12-13 (×9): 1 via ORAL
  Filled 2018-12-04 (×10): qty 1

## 2018-12-04 MED ORDER — SENNOSIDES-DOCUSATE SODIUM 8.6-50 MG PO TABS
1.0000 | ORAL_TABLET | Freq: Two times a day (BID) | ORAL | Status: DC
  Administered 2018-12-04 – 2018-12-13 (×19): 1 via ORAL
  Filled 2018-12-04 (×19): qty 1

## 2018-12-04 MED ORDER — ONDANSETRON HCL 4 MG/2ML IJ SOLN
4.0000 mg | Freq: Four times a day (QID) | INTRAMUSCULAR | Status: DC | PRN
  Administered 2018-12-05 – 2018-12-06 (×3): 4 mg via INTRAVENOUS
  Filled 2018-12-04 (×3): qty 2

## 2018-12-04 MED ORDER — SODIUM CHLORIDE 0.45 % IV SOLN
INTRAVENOUS | Status: DC
  Administered 2018-12-04 – 2018-12-11 (×9): via INTRAVENOUS

## 2018-12-04 MED ORDER — FOLIC ACID 1 MG PO TABS
1.0000 mg | ORAL_TABLET | Freq: Every day | ORAL | Status: DC
  Administered 2018-12-04 – 2018-12-13 (×10): 1 mg via ORAL
  Filled 2018-12-04 (×10): qty 1

## 2018-12-04 MED ORDER — ASPIRIN EC 81 MG PO TBEC
81.0000 mg | DELAYED_RELEASE_TABLET | Freq: Every day | ORAL | Status: DC
  Administered 2018-12-04 – 2018-12-13 (×10): 81 mg via ORAL
  Filled 2018-12-04 (×10): qty 1

## 2018-12-04 MED ORDER — HYDROMORPHONE HCL 2 MG/ML IJ SOLN
2.0000 mg | INTRAMUSCULAR | Status: AC
  Filled 2018-12-04: qty 1

## 2018-12-04 MED ORDER — HYDROMORPHONE HCL 1 MG/ML IJ SOLN
1.0000 mg | INTRAMUSCULAR | Status: DC | PRN
  Administered 2018-12-04 (×2): 1 mg via INTRAVENOUS
  Filled 2018-12-04 (×2): qty 1

## 2018-12-04 MED ORDER — DIPHENHYDRAMINE HCL 25 MG PO CAPS: 25.0000 mg | ORAL_CAPSULE | ORAL | Status: DC | PRN

## 2018-12-04 MED ORDER — ENSURE ENLIVE PO LIQD
237.0000 mL | Freq: Two times a day (BID) | ORAL | Status: DC
  Administered 2018-12-04 – 2018-12-13 (×17): 237 mL via ORAL

## 2018-12-04 NOTE — ED Notes (Signed)
OB nurse at bedside to monitor baby

## 2018-12-04 NOTE — ED Notes (Signed)
Per lab, pt has antibodies and will have to get her blood shipped here causing a delay.

## 2018-12-04 NOTE — ED Notes (Signed)
ED TO INPATIENT HANDOFF REPORT  Name/Age/Gender Carmen [name] 28 y.o. female  Code Status Code Status History    Date Active Date Inactive Code Status Order ID Comments User Context   11/04/2018 1249 11/10/2018 1454 Full Code [id number]  Elwyn Reach, MD Inpatient   10/07/2018 1522 10/13/2018 2116 Full Code [id number]  Elwyn Reach, MD ED   08/25/2017 0200 08/30/2017 1810 Full Code [id number]  Norval Morton, MD ED   08/21/2017 0232 08/23/2017 1549 Full Code [id number]  Ivor Costa, MD ED   07/17/2017 1314 07/20/2017 1853 Full Code [id number]  Elwyn Reach, MD Inpatient   06/27/2017 0610 06/28/2017 1931 Full Code [id number]  Rise Patience, MD ED   06/25/2017 1408 06/25/2017 2232 Full Code [id number]  Leana Gamer, MD Inpatient   06/21/2017 0019 06/24/2017 2204 Full Code [id number]  Etta Quill, DO ED   05/29/2017 0147 05/31/2017 1353 Full Code [id number]  Etta Quill, DO ED   05/23/2017 0336 05/24/2017 1817 Full Code [id number]  Vianne Bulls, MD ED   05/18/2017 0211 05/22/2017 1416 Full Code [id number]  Reubin Milan, MD Inpatient   05/18/2017 0211 05/18/2017 0211 Full Code [id number]  Reubin Milan, MD Inpatient   03/23/2017 0613 03/25/2017 1907 Full Code [id number]  Norval Morton, MD ED   03/19/2017 0456 03/19/2017 1534 Full Code [id number]  Lily Kocher, MD ED   01/19/2017 2128 01/25/2017 2104 Full Code [id number]  Annita Brod, MD Inpatient   12/24/2016 0933 12/28/2016 1853 Full Code [id number]  Tresa Garter, MD Inpatient   11/30/2016 1020 11/30/2016 2123 Full Code [id number]  Tresa Garter, MD Inpatient   08/30/2016 0503 09/05/2016 1511 Full Code [id number]  Norval Morton, MD ED   08/16/2016 0049 08/23/2016 2056 Full Code [id number]  Edwin Dada, MD ED   06/14/2016 1655 06/19/2016 1703 Full Code [id number]  Elwyn Reach, MD Inpatient   05/14/2016 2103 05/22/2016 2028 Full Code [id number]  Louellen Molder, MD Inpatient   04/19/2016 1519  04/26/2016 2049 Full Code [id number]  Leana Gamer, MD Inpatient   02/10/2016 2022 02/15/2016 1859 Full Code [id number]  Vianne Bulls, MD ED   12/19/2015 1324 12/25/2015 2045 Full Code [id number]  Elwyn Reach, MD Inpatient   12/19/2015 0746 12/19/2015 1324 Full Code [id number]  Reyne Dumas, MD ED   11/11/2015 0409 11/17/2015 2103 Full Code [id number]  Toy Baker, MD ED   10/17/2015 1248 10/24/2015 2031 Full Code [id number]  Leana Gamer, MD ED   10/07/2015 1809 10/14/2015 2058 Full Code [id number]  Robbie Lis, MD ED   09/09/2015 0207 09/14/2015 2131 Full Code [id number]  Edwin Dada, MD Inpatient   03/12/2015 0338 03/18/2015 1702 Full Code [id number]  Rise Patience, MD Inpatient   02/28/2015 2122 03/06/2015 1547 Full Code [id number]  Toy Baker, MD Inpatient   02/25/2015 1720 02/27/2015 1336 Full Code [id number]  Bonnielee Haff, MD Inpatient   02/16/2015 0043 02/22/2015 1921 Full Code [id number]  Lavina Hamman, MD ED   11/28/2014 2048 12/02/2014 1435 Full Code [id number]  Orson Eva, MD Inpatient   11/22/2014 2314 11/24/2014 1658 Full Code [id number]  Theressa Millard, MD Inpatient   05/16/2014 0631 05/18/2014 1649 Full Code [id number]  Rise Patience, MD Inpatient   11/12/2013 2227 11/13/2013 1851 Full Code [id number]  Annita Brod, MD Inpatient      Home/SNF/Other Home  Chief Complaint Sickle Cell Pain Crisis  Level of Care/Admitting Diagnosis  ED Disposition    ED Disposition Condition [facility] Area: [facility] [[id number]]  Level of Care: Med-Surg [16]  Diagnosis: Sickle cell pain crisis [facility] [[id number]]  Admitting Physician: [name] [phone]  Attending Physician: Tresa Garter [[id number]]  Estimated length of stay: past midnight tomorrow  Certification:: I certify this patient will need inpatient services for at least 2 midnights  PT Class (Do Not Modify): Inpatient [101]  PT Acc Code (Do Not  Modify): Private [1]       Medical History Past Medical History:  Diagnosis Date  . Abortion    05/2012  . Headache(784.0)   . Sickle cell crisis ([place])   . Syphilis 2015   Was diagnosed and received one injection of antibiotics    Allergies No Known Allergies  IV Location/Drains/Wounds Patient Lines/Drains/Airways Status   Active Line/Drains/Airways    Name:   Placement date:   Placement time:   Site:   Days:   Peripheral IV 12/04/18 Left;Lateral Wrist   12/04/18    0131    Wrist   less than 1          Labs/Imaging Results for orders placed or performed during the hospital encounter of 12/03/18 (from the past 48 hour(s))  CBC with Differential     Status: Abnormal   Collection Time: 12/04/18 12:13 AM  Result Value Ref Range   WBC 16.2 (H) 4.0 - 10.5 K/uL    Comment: NUCLEATED RED CELLS PRESENT   RBC 2.27 (L) 3.87 - 5.11 MIL/uL   Hemoglobin 7.4 (L) 12.0 - 15.0 g/dL   HCT 21.3 (L) 36.0 - 46.0 %   MCV 93.8 80.0 - 100.0 fL   MCH 32.6 26.0 - 34.0 pg   MCHC 34.7 30.0 - 36.0 g/dL   RDW 17.1 (H) 11.5 - 15.5 %   Platelets 522 (H) 150 - 400 K/uL   nRBC 9.3 (H) 0.0 - 0.2 %   Neutrophils Relative % 76 %   Lymphocytes Relative 14 %   Monocytes Relative 9 %   Eosinophils Relative 1 %   Basophils Relative 0 %   Band Neutrophils 0 %   Metamyelocytes Relative 0 %   Myelocytes 0 %   Promyelocytes Relative 0 %   Blasts 0 %   nRBC 9 (H) 0 /100 WBC   Other 0 %   Neutro Abs 12.2 (H) 1.7 - 7.7 K/uL   Lymphs Abs 2.3 0.7 - 4.0 K/uL   Monocytes Absolute 1.5 (H) 0.1 - 1.0 K/uL   Eosinophils Absolute 0.2 0.0 - 0.5 K/uL   Basophils Absolute 0.0 0.0 - 0.1 K/uL   RBC Morphology POLYCHROMASIA PRESENT     Comment: TARGET CELLS Sickle cells present RARE NRBCs Performed at [facility], [place] [address], [place], [place] [address]   Comprehensive metabolic panel     Status: Abnormal   Collection Time: 12/04/18 12:13 AM  Result Value Ref Range   Sodium 141 135 - 145  mmol/L   Potassium 3.4 (L) 3.5 - 5.1 mmol/L   Chloride 112 (H) 98 - 111 mmol/L   CO2 21 (L) 22 - 32 mmol/L   Glucose, Bld 85 70 - 99 mg/dL   BUN 8 6 - 20 mg/dL   Creatinine, Ser 0.50 0.44 - 1.00 mg/dL   Calcium 8.8 (L) 8.9 - 10.3 mg/dL   Total Protein 7.2 6.5 - 8.1 g/dL   Albumin 2.9 (L) 3.5 - 5.0 g/dL  AST 31 15 - 41 U/L   ALT 30 0 - 44 U/L   Alkaline Phosphatase 158 (H) 38 - 126 U/L   Total Bilirubin 1.3 (H) 0.3 - 1.2 mg/dL   GFR calc non Af Amer >60 >60 mL/min   GFR calc Af Amer >60 >60 mL/min   Anion gap 8 5 - 15    Comment: Performed at [facility], [place] [address] [place], [place] [address]  Reticulocytes     Status: Abnormal   Collection Time: 12/04/18 12:13 AM  Result Value Ref Range   Retic Ct Pct 13.0 (H) 0.4 - 3.1 %   RBC. 2.27 (L) 3.87 - 5.11 MIL/uL   Retic Count, Absolute 296.0 (H) 19.0 - 186.0 K/uL   Immature Retic Fract 49.7 (H) 2.3 - 15.9 %    Comment: Performed at [facility], [place] [address] [place], [place] [address]  Type and screen [place]     Status: None (Preliminary result)   Collection Time: 12/04/18  1:34 AM  Result Value Ref Range   ABO/RH(D) O POS    Antibody Screen POS    Sample Expiration      12/07/2018 Performed at [facility], [place] [address] [place], [place] [address]    Antibody Identification PENDING    No results found. None  Pending Labs Unresulted Labs (From admission, onward)    Start     Ordered   12/04/18 0341  Prepare RBC  (Adult Blood Administration - Red Blood Cells)  Once,   R    Question Answer Comment  # of Units 1 unit   Transfusion Indications Symptomatic Anemia   Number of Units to Keep Ahead 2 units ahead   If emergent release call blood bank Not emergent release   Instructions: Transfuse      12/04/18 0340   Signed and Held  Basic metabolic panel  Tomorrow morning,   R     Signed and Held   Signed and Held  CBC with  Differential/Platelet  Tomorrow morning,   R     Signed and Held          Vitals/Pain Today's Vitals   12/04/18 0620 12/04/18 0646 12/04/18 0700 12/04/18 0802  BP:  (!) 102/58 (!) 100/51 98/60  Pulse:  76 80 72  Resp:  16 15 16   Temp:      TempSrc:      SpO2:  99% 99% 99%  Weight:      Height:      PainSc: 9        Isolation Precautions No active isolations  Medications Medications  HYDROmorphone (DILAUDID) injection 2 mg (2 mg Intravenous Not Given 12/04/18 0536)    Or  HYDROmorphone (DILAUDID) injection 2 mg ( Subcutaneous See Alternative 12/04/18 0536)  ondansetron (ZOFRAN) injection 4 mg (4 mg Intravenous Given 12/04/18 0131)  0.45 % sodium chloride infusion ( Intravenous New Bag/Given 12/04/18 0212)  0.9 %  sodium chloride infusion (Manually program via Guardrails IV Fluids) ( Intravenous Hold 12/04/18 0537)  HYDROmorphone (DILAUDID) injection 1 mg (1 mg Intravenous Given 12/04/18 0809)  HYDROmorphone (DILAUDID) injection 2 mg (2 mg Intravenous Given 12/04/18 0131)    Or  HYDROmorphone (DILAUDID) injection 2 mg ( Subcutaneous See Alternative 12/04/18 0131)  HYDROmorphone (DILAUDID) injection 2 mg (2 mg Intravenous Given 12/04/18 0209)    Or  HYDROmorphone (DILAUDID) injection 2 mg ( Subcutaneous See Alternative 12/04/18 0209)  HYDROmorphone (DILAUDID) injection  2 mg (2 mg Intravenous Given 12/04/18 0247)    Or  HYDROmorphone (DILAUDID) injection 2 mg ( Subcutaneous See Alternative 12/04/18 0247)    Mobility walks

## 2018-12-04 NOTE — Progress Notes (Addendum)
Initial Nutrition Assessment  INTERVENTION:   Provide Ensure Enlive po BID, each supplement provides 350 kcal and 20 grams of protein  NUTRITION DIAGNOSIS:   Increased nutrient needs related to (pregnancy) as evidenced by estimated needs.  GOAL:   Patient will meet greater than or equal to 90% of their needs  MONITOR:   PO intake, Supplement acceptance, Labs, Weight trends, I & O's  REASON FOR ASSESSMENT:   Consult Diet education  ASSESSMENT:   28 y.o. female with a history of sickle cell anemia, hemoglobin SS, chronic pain syndrome, and currently [redacted] weeks pregnant presents complaining of diffuse pain primarily to upper and lower extremities for greater than 1 week. Admitted for sickle cell pain crisis.  Patient reports some pain but not as bad as it was PTA. Pt currently [redacted] weeks pregnant, in 3rd trimester currently. Due date is in April 2020. Pt reports she has not had any N/V PTA. Pt has been eating anything she wants but states the baby doesn't agree when she eats oranges. Pt is compliant with taking daily prenatal vitamins. Pt would like to have Ensure supplements during this admission. Pt also requests ability to order honey thick juice if she can, will allow up to 1 per meal tray.   Pre-pregnancy weight is 154-155 lb. Pt has gained 14 lb so far this pregnancy. Recommended weight gain during 3rd trimester is 15-25 lb. Pt states she feels like she is gaining 2 lb a day. Explained fluid shifts. Pt is currently on track to gain recommended weight.   Medications: Folic acid tablet daily,  Prenatal MVI daily, Senokot-S tablet BID Labs reviewed: Low K   NUTRITION - FOCUSED PHYSICAL EXAM:  Nutrition focused physical exam shows no sign of depletion of muscle mass or body fat.  Diet Order:   Diet Order            Diet regular Room service appropriate? Yes  Diet effective now              EDUCATION NEEDS:   Education needs have been addressed  Skin:  Skin Assessment:  Reviewed RN Assessment  Last BM:  2/2  Height:   Ht Readings from Last 1 Encounters:  12/03/18 5\' 3"  (1.6 m)    Weight:   Wt Readings from Last 1 Encounters:  12/03/18 77 kg    Ideal Body Weight:  52.3 kg  BMI:  Body mass index is 30.07 kg/m.  Estimated Nutritional Needs:   Kcal:  1650-1850  Protein:  75-85g  Fluid:  2.4L/day  Clayton Bibles, MS, RD, LDN [place] Dietitian Pager: [phone] After Hours Pager: [phone]

## 2018-12-04 NOTE — Progress Notes (Signed)
Pt denies any vaginal bleeding or leaking of fluid. Says she feels the baby move.FHR tracing is a category 1 with no uc's. Dr. Rip Harbour notified.

## 2018-12-04 NOTE — ED Notes (Signed)
RROB IN TO ASSESS PT THAT IS 29 WEEKS IN SICKLE CELL CRISIS; PT REPORTS POSITIVE FETAL MOVEMENT, NO VAGINAL BLEEDING OR LEAKING OF FLUID, NO CONTRACTIONS/CRAMPING/PAIN IN ABDOMEN; REPORTS PAIN IN ARMS AND LEGS R/T SICKLE CELL

## 2018-12-04 NOTE — Consult Note (Addendum)
Obstetrics & Gynecology Consult Note   Date of Consult: 12/04/2018   Requesting Provider: [name], FNP  Primary OBGYN: [facility] Primary Care Provider: Lanae Boast  Reason for Admission: [place] crisis  History of Present Illness: Ms. Alfred is a 28 y.o. G2P0010 (Patient's last menstrual period was 05/15/2018 (approximate).), with the above CC.  See admitting H&P for details  Patient denies any abdominal pain, contractions, decreased fetal movement, vaginal bleeding or leakage of fluid.     ROS: A 12-point review of systems was performed and negative, except as stated in the above HPI.  OBGYN History: As per HPI. OB History  Gravida Para Term Preterm AB Living  2       1 0  SAB TAB Ectopic Multiple Live Births    1          # Outcome Date GA Lbr Len/2nd Weight Sex Delivery Anes PTL Lv  2 Current           1 TAB 05/2012              Past Medical History: Past Medical History:  Diagnosis Date  . Headache(784.0)   . Sickle cell crisis ([place])   . Syphilis 2015   Was diagnosed and received one injection of antibiotics    Past Surgical History: Past Surgical History:  Procedure Laterality Date  . CHOLECYSTECTOMY N/A 11/30/2014   Procedure: LAPAROSCOPIC CHOLECYSTECTOMY SINGLE SITE WITH INTRAOPERATIVE CHOLANGIOGRAM;  Surgeon: Michael Boston, MD;  Location: [facility] ORS;  Service: General;  Laterality: N/A;  . SPLENECTOMY      Family History:  Family History  Problem Relation Age of Onset  . Hypertension Mother   . Sickle cell anemia Sister   . Kidney disease Sister        Lupus  . Arthritis Sister   . Sickle cell anemia Sister   . Sickle cell trait Sister   . Heart disease Maternal Aunt        CABG  . Heart disease Maternal Uncle        CABG  . Lupus Sister     Social History:  Social History   Socioeconomic History  . Marital status: Single    Spouse name: Not on file  . Number of children: Not on file  . Years  of education: Not on file  . Highest education level: Not on file  Occupational History  . Not on file  Social Needs  . Financial resource strain: Not on file  . Food insecurity:    Worry: Sometimes true    Inability: Sometimes true  . Transportation needs:    Medical: Yes    Non-medical: Yes  Tobacco Use  . Smoking status: Never Smoker  . Smokeless tobacco: Never Used  Substance and Sexual Activity  . Alcohol use: No  . Drug use: No  . Sexual activity: Yes    Birth control/protection: None  Lifestyle  . Physical activity:    Days per week: Not on file    Minutes per session: Not on file  . Stress: Not on file  Relationships  . Social connections:    Talks on phone: Not on file    Gets together: Not on file    Attends religious service: Not on file    Active member of club or organization: Not on file    Attends meetings of clubs or organizations: Not on file    Relationship status: Not on file  . Intimate  partner violence:    Fear of current or ex partner: Not on file    Emotionally abused: Not on file    Physically abused: Not on file    Forced sexual activity: Not on file  Other Topics Concern  . Not on file  Social History Narrative  . Not on file    Allergy: No Known Allergies  Current Outpatient Medications: Medications Prior to Admission  Medication Sig Dispense Refill Last Dose  . aspirin EC 81 MG tablet Take 1 tablet (81 mg total) by mouth daily. 90 tablet 3 12/03/2018 at Unknown time  . ergocalciferol (VITAMIN D2) 50000 units capsule Take 1 capsule (50,000 Units total) by mouth once a week. 30 capsule 1 05/02/5365  . folic acid (FOLVITE) 1 MG tablet Take 1 tablet (1 mg total) by mouth daily. 90 tablet 3 12/03/2018 at Unknown time  . morphine (MS CONTIN) 30 MG 12 hr tablet Take 1 tablet (30 mg total) by mouth every 8 (eight) hours as needed for pain. 90 tablet 0 Past Week at Unknown time  . oxyCODONE (OXY IR/ROXICODONE) 5 MG immediate release tablet Take 5 mg  by mouth every 4 (four) hours as needed for moderate pain.    12/03/2018 at Unknown time  . Prenatal Vit-Fe Fumarate-FA (PREPLUS) 27-1 MG TABS Take 1 tablet by mouth daily. 30 tablet 13 12/03/2018 at Unknown time  . Nutritional Supplements (ENSURE HEALTHY MOM) LIQD Drink 1 pouch BID (Patient not taking: Reported on 11/29/2018) 60 Bottle 6 Not Taking at Unknown time     Hospital Medications: Current Facility-Administered Medications  Medication Dose Route Frequency Provider Last Rate Last Dose  . 0.45 % sodium chloride infusion   Intravenous Continuous [name], FNP 125 mL/hr at 12/04/18 0950    . aspirin EC tablet 81 mg  81 mg Oral Daily [name], FNP   81 mg at 12/04/18 0941  . diphenhydrAMINE (BENADRYL) capsule 25 mg  25 mg Oral Q4H PRN [name], FNP      . folic acid (FOLVITE) tablet 1 mg  1 mg Oral Daily [name], FNP   1 mg at 12/04/18 0941  . HYDROmorphone (DILAUDID) 1 mg/mL PCA injection   Intravenous Q4H [name], FNP   30 mg at 12/04/18 4403  . morphine (MS CONTIN) 12 hr tablet 30 mg  30 mg Oral Q12H [name], FNP   30 mg at 12/04/18 0941  . naloxone [facility]) injection 0.4 mg  0.4 mg Intravenous PRN [name], FNP       And  . sodium chloride flush (NS) 0.9 % injection 9 mL  9 mL Intravenous PRN [name], FNP      . ondansetron [facility]) injection 4 mg  4 mg Intravenous Q6H PRN [name], FNP      . polyethylene glycol (MIRALAX / GLYCOLAX) packet 17 g  17 g Oral Daily PRN [name], FNP      . prenatal multivitamin tablet 1 tablet  1 tablet Oral Daily [name], FNP      . senna-docusate (Senokot-S) tablet 1 tablet  1 tablet Oral BID [name], FNP   1 tablet at 12/04/18 4742     Physical Exam:   Current Vital Signs 24h Vital Sign Ranges  T 98.1 F (36.7 C) Temp  Avg: 98.2 F (36.8 C)  Min: 98.1 F (36.7 C)  Max: 98.3 F (36.8 C)  BP 124/70 BP  Min: 95/51  Max: 124/70  HR 72  Pulse  Avg: 86.1  Min: 72  Max: 108  RR 15 Resp  Avg: 16  Min: 15  Max: 17  SaO2 100 % Room Air SpO2  Avg: 99.1 %  Min: 97 %  Max: 100 %       24 Hour I/O Current Shift I/O  Time Ins Outs No intake/output data recorded. No intake/output data recorded.   Body mass index is 30.07 kg/m. General appearance: Well nourished, well developed female in no acute distress.  Cardiovascular: S1, S2 normal, no murmur, rub or gallop, regular rate and rhythm Respiratory:  Clear to auscultation bilateral. Normal respiratory effort Abdomen: gravid, nttp Neuro/Psych:  Normal mood and affect.  Skin:  Warm and dry.  Extremities: no clubbing, cyanosis, or edema.   Laboratory:  Recent Labs  Lab 11/29/18 0841 12/04/18 0013  WBC 16.8* 16.2*  HGB 7.5* 7.4*  HCT 21.8* 21.3*  PLT 582* 522*   Recent Labs  Lab 12/04/18 0013  NA 141  K 3.4*  CL 112*  CO2 21*  BUN 8  CREATININE 0.50  CALCIUM 8.8*  PROT 7.2  BILITOT 1.3*  ALKPHOS 158*  ALT 30  AST 31  GLUCOSE 85    Recent Labs  Lab 12/04/18 0134  ABORH O POS    Imaging:  No new imaging  Assessment: Ms. Robel is a 28 y.o. G2P0010 @ 110/0 with [place] crisis. Patient [facility] from [facility] perspetive  Plan: Defer to inpatient re: [place] crisis management. Please let us know if any questions OB-wise, patient is doing well and no current issues. Continue with qday fetal non stress tests.  New antibodies on t&s today. F/u pending labs to see if they are significant in pregnancy  Total time taking care of the patient was 20 minutes, with greater than 50% of the time spent in face to face interaction with the patient.  Durene Romans MD Attending [facility])

## 2018-12-04 NOTE — ED Notes (Signed)
Rapid Response notified and will come and monitor patient

## 2018-12-04 NOTE — Progress Notes (Signed)
RROB NOTIFIED DR HARRAWAY-SMITH, OB ATTENDING OF PT HERE IN SICKLE CELL CRISIS; G2P0, 29 WEEKS, NO VAGINAL BLEEDING OR LEAKING OF FLUID, NO PAIN IN ABDOMEN/NO SIGNS OF LABOR; POSITIVE FETAL MOVEMENT; FHR WNL WITH 10X10 ACCELS, ONE VARIABLE SEEN, SOME UTERINE IRRITABILITY   DR ISAACS-ED PROVIDER CONSULT WITH OB; PT WILL BE ADMITTED TO [facility];   WILL NEED TO CONSULT/DISCUSS PLAN OF CARE WITH [facility], OB ATTENDING, REGARDING ORDERS/FETAL MONITORING, ETC.... RELATED TO OB...[phone]

## 2018-12-04 NOTE — Progress Notes (Signed)
Called to get report at 0850 from [facility]

## 2018-12-04 NOTE — H&P (Signed)
H&P  Patient Demographics:  Carmen [name], is a 28 y.o. female  MRN: [id number]   DOB - [birth year]  Admit Date - 12/03/2018  Outpatient Primary MD for the patient is Lanae Boast, [place]  Chief Complaint  Patient presents with  . Sickle Cell Pain Crisis    Pregnant      HPI:   Carmen [name]  is a 28 y.o. female with a history of sickle cell anemia, hemoglobin SS, chronic pain syndrome, and currently [redacted] weeks pregnant presents complaining of diffuse pain primarily to upper and lower extremities for greater than 1 week.  Patient states that symptoms have been worsening over the past several days.  She has been taking oxycodone 5 mg every 6 hours and morphine 30 mg every 12 hours without sustained relief.  Patient states that pain is similar to previous sickle cell crisis.  Patient was evaluated by obstetrician last week due to ongoing anemia.  Patient was not transfused during that time.  Patient states that she has been taking prenatal vitamins and folic acid consistently and she is feeling the baby move frequently.  She denies contractions, vaginal bleeding, or vaginal discharge.  Patient denies fever, cough, chest pains, shortness of breath or other focal deficits. Current pain intensity is 10/10 characterized as constant and throbbing.  ER course: WBCs 16.2, hemoglobin 7.4, which is below baseline of 8-9, potassium 3.4, and total bilirubin 1.3.  Patient maintaining oxygen saturation above 90% on RA.  Also, patient is currently afebrile.  Patient's pain persist despite IV Dilaudid, and IV fluids.  Patient admitted to [facility] for further pain control   Review of systems:  In addition to the HPI above, patient reports Review of Systems  Constitutional: Negative for chills and fever.  HENT: Negative.   Eyes: Negative.   Respiratory: Negative.  Negative for sputum production.   Cardiovascular: Negative for chest pain, palpitations and leg swelling.  Gastrointestinal: Negative.   Genitourinary:  Negative.   Musculoskeletal: Negative.   Skin: Negative.   Neurological: Negative.   Endo/Heme/Allergies: Negative.   Psychiatric/Behavioral: Negative.      A full 10 point Review of Systems was done, except as stated above, all other Review of Systems were negative.  With Past History of the following :   Past Medical History:  Diagnosis Date  . Abortion    05/2012  . Headache(784.0)   . Sickle cell crisis ([place])   . Syphilis 2015   Was diagnosed and received one injection of antibiotics      Past Surgical History:  Procedure Laterality Date  . CHOLECYSTECTOMY N/A 11/30/2014   Procedure: LAPAROSCOPIC CHOLECYSTECTOMY SINGLE SITE WITH INTRAOPERATIVE CHOLANGIOGRAM;  Surgeon: Michael Boston, MD;  Location: [facility];  Service: General;  Laterality: N/A;  . SPLENECTOMY       Social History:   Social History   Tobacco Use  . Smoking status: Never Smoker  . Smokeless tobacco: Never Used  Substance Use Topics  . Alcohol use: No     Lives - At home   Family History :   Family History  Problem Relation Age of Onset  . Hypertension Mother   . Sickle cell anemia Sister   . Kidney disease Sister        Lupus  . Arthritis Sister   . Sickle cell anemia Sister   . Sickle cell trait Sister   . Heart disease Maternal Aunt        CABG  . Heart disease Maternal Uncle  CABG  . Lupus Sister      Home Medications:   Prior to Admission medications   Medication Sig Start Date End Date Taking? Authorizing Provider  aspirin EC 81 MG tablet Take 1 tablet (81 mg total) by mouth daily. 08/17/18  Yes [name], MD  ergocalciferol (VITAMIN D2) 50000 units capsule Take 1 capsule (50,000 Units total) by mouth once a week. 05/22/18  Yes Tresa Garter, MD  folic acid (FOLVITE) 1 MG tablet Take 1 tablet (1 mg total) by mouth daily. 08/17/18  Yes [name], MD  morphine (MS CONTIN) 30 MG 12 hr tablet Take 1 tablet (30 mg total) by mouth every 8 (eight) hours as needed  for pain. 11/02/18  Yes Lanae Boast, FNP  oxyCODONE (OXY IR/ROXICODONE) 5 MG immediate release tablet Take 5 mg by mouth every 4 (four) hours as needed for moderate pain.  10/31/18  Yes [provider]  Prenatal Vit-Fe Fumarate-FA (PREPLUS) 27-1 MG TABS Take 1 tablet by mouth daily. 08/17/18  Yes [name], MD  Nutritional Supplements (ENSURE HEALTHY MOM) LIQD Drink 1 pouch BID Patient not taking: Reported on 11/29/2018 10/18/18   Emily Filbert, MD     Allergies:   No Known Allergies   Physical Exam:   Vitals:   Vitals:   12/04/18 0700 12/04/18 0802  BP: (!) 100/51 98/60  Pulse: 80 72  Resp: 15 16  Temp:    SpO2: 99% 99%    Physical Exam: Constitutional: Patient appears well-developed and well-nourished. Not in obvious distress. HENT: Normocephalic, atraumatic, External right and left ear normal. Oropharynx is clear and moist.  Eyes: Conjunctivae and EOM are normal. PERRLA, no scleral icterus. Neck: Normal ROM. Neck supple. No JVD. No tracheal deviation. No thyromegaly. CVS: RRR, S1/S2 +, no murmurs, no gallops, no carotid bruit.  Pulmonary: Effort and breath sounds normal, no stridor, rhonchi, wheezes, rales.  Abdominal: Soft. BS +, no distension, tenderness, rebound or guarding.  Musculoskeletal: Normal range of motion. No edema and no tenderness.  Lymphadenopathy: No lymphadenopathy noted, cervical, inguinal or axillary Neuro: Alert. Normal reflexes, muscle tone coordination. No cranial nerve deficit. Skin: Skin is warm and dry. No rash noted. Not diaphoretic. No erythema. No pallor. Psychiatric: Normal mood and affect. Behavior, judgment, thought content normal.   Data Review:   CBC Recent Labs  Lab 11/29/18 0841 12/04/18 0013  WBC 16.8* 16.2*  HGB 7.5* 7.4*  HCT 21.8* 21.3*  PLT 582* 522*  MCV 92 93.8  MCH 31.6 32.6  MCHC 34.4 34.7  RDW 15.5* 17.1*  LYMPHSABS  --  2.3  MONOABS  --  1.5*  EOSABS  --  0.2  BASOSABS  --  0.0    ------------------------------------------------------------------------------------------------------------------  Chemistries  Recent Labs  Lab 12/04/18 0013  NA 141  K 3.4*  CL 112*  CO2 21*  GLUCOSE 85  BUN 8  CREATININE 0.50  CALCIUM 8.8*  AST 31  ALT 30  ALKPHOS 158*  BILITOT 1.3*   ------------------------------------------------------------------------------------------------------------------ estimated creatinine clearance is 105.6 mL/min (by C-G formula based on SCr of 0.5 mg/dL). ------------------------------------------------------------------------------------------------------------------ No results for input(s): TSH, T4TOTAL, T3FREE, THYROIDAB in the last 72 hours.  Invalid input(s): FREET3  Coagulation profile No results for input(s): INR, PROTIME in the last 168 hours. ------------------------------------------------------------------------------------------------------------------- No results for input(s): DDIMER in the last 72 hours. -------------------------------------------------------------------------------------------------------------------  Cardiac Enzymes No results for input(s): CKMB, TROPONINI, MYOGLOBIN in the last 168 hours.  Invalid input(s): CK ------------------------------------------------------------------------------------------------------------------ No results found  for: BNP  ---------------------------------------------------------------------------------------------------------------  Urinalysis    Component Value Date/Time   COLORURINE AMBER (A) 10/07/2018 0610   APPEARANCEUR HAZY (A) 10/07/2018 0610   LABSPEC 1.013 10/07/2018 0610   PHURINE 6.0 10/07/2018 0610   GLUCOSEU NEGATIVE 10/07/2018 0610   HGBUR NEGATIVE 10/07/2018 0610   BILIRUBINUR small 11/30/2018 1025   KETONESUR 80 (A) 10/07/2018 0610   PROTEINUR Negative 11/30/2018 1025   PROTEINUR NEGATIVE 10/07/2018 0610   UROBILINOGEN >=8.0 (A) 11/30/2018 1025    UROBILINOGEN 1.0 01/12/2018 0943   NITRITE neg 11/30/2018 1025   NITRITE NEGATIVE 10/07/2018 0610   LEUKOCYTESUR Moderate (2+) (A) 11/30/2018 1025    ----------------------------------------------------------------------------------------------------------------   Imaging Results:    No results found.    Assessment & Plan:  Active Problems:   Anemia of chronic disease   Supervision of high risk pregnancy, antepartum   Sickle cell pain crisis (HCC)  Sickle cell anemia with pain crisis Pain persist despite IV Dilaudid.  Admit, continue IV fluid at 0.45% saline at 125 mL/h Weight-based Dilaudid PCA with settings of 0.5 mg, 10-minute lockout, and 3 mg/h Maintaining oxygen saturation above 90%  Reevaluate pain scale regularly  Anemia of chronic disease: Hemoglobin 7.4, which is below baseline of 8-9.  Patient endorses shortness of breath. Continue to follow CBC Repeat hemoglobinopathy  Leukocytosis: WBC 16.2, patient afebrile.  No signs of infection.  Most likely reactive. Continue to follow CBC  Third trimester pregnancy: Consult to obstetrics Daily fetal heart tones Continue prenatal vitamins Consult to dietitian  Chronic pain syndrome: Continue MS Contin 30 mg every 12 hours Hold oxycodone, use PCA Dilaudid as substitute  DVT Prophylaxis:   AM Labs Ordered, also please review Full Orders  Family Communication: Admission, patient's condition and plan of care including tests being ordered have been discussed with the patient who indicate understanding and agree with the plan and Code Status.  Code Status: Full Code  Consults called: None    Admission status: Inpatient    Time spent in minutes : 50 minutes  [place], MSN, FNP-C Patient [facility] [address] [place], [place] [address] [phone]  12/04/2018 at 8:24 AM

## 2018-12-05 ENCOUNTER — Telehealth: Payer: Self-pay

## 2018-12-05 DIAGNOSIS — D571 Sickle-cell disease without crisis: Secondary | ICD-10-CM

## 2018-12-05 DIAGNOSIS — Z79891 Long term (current) use of opiate analgesic: Secondary | ICD-10-CM

## 2018-12-05 DIAGNOSIS — D563 Thalassemia minor: Secondary | ICD-10-CM

## 2018-12-05 LAB — BASIC METABOLIC PANEL
Anion gap: 6 (ref 5–15)
BUN: 8 mg/dL (ref 6–20)
CO2: 23 mmol/L (ref 22–32)
Calcium: 8.8 mg/dL — ABNORMAL LOW (ref 8.9–10.3)
Chloride: 106 mmol/L (ref 98–111)
Creatinine, Ser: 0.44 mg/dL (ref 0.44–1.00)
GFR calc Af Amer: >60 mL/min (ref >60)
GFR calc non Af Amer: >60 mL/min (ref >60)
GLUCOSE: 87 mg/dL (ref 70–99)
Potassium: 4 mmol/L (ref 3.5–5.1)
Sodium: 135 mmol/L (ref 135–145)

## 2018-12-05 LAB — CBC WITH DIFFERENTIAL/PLATELET
Abs Immature Granulocytes: 0.26 10*3/uL — ABNORMAL HIGH (ref 0.00–0.07)
Basophils Absolute: 0 10*3/uL (ref 0.0–0.1)
Basophils Relative: 0 %
Eosinophils Absolute: 0.3 10*3/uL (ref 0.0–0.5)
Eosinophils Relative: 2 %
HCT: 24.3 % — ABNORMAL LOW (ref 36.0–46.0)
Hemoglobin: 8.2 g/dL — ABNORMAL LOW (ref 12.0–15.0)
IMMATURE GRANULOCYTES: 2 %
Lymphocytes Relative: 25 %
Lymphs Abs: 3.2 10*3/uL (ref 0.7–4.0)
MCH: 31.5 pg (ref 26.0–34.0)
MCHC: 33.7 g/dL (ref 30.0–36.0)
MCV: 93.5 fL (ref 80.0–100.0)
Monocytes Absolute: 1.6 10*3/uL — ABNORMAL HIGH (ref 0.1–1.0)
Monocytes Relative: 13 %
Neutro Abs: 7.3 10*3/uL (ref 1.7–7.7)
Neutrophils Relative %: 58 %
Platelets: 453 10*3/uL — ABNORMAL HIGH (ref 150–400)
RBC: 2.6 MIL/uL — ABNORMAL LOW (ref 3.87–5.11)
RDW: 16.2 % — ABNORMAL HIGH (ref 11.5–15.5)
WBC: 12.6 10*3/uL — ABNORMAL HIGH (ref 4.0–10.5)
nRBC: 11.3 % — ABNORMAL HIGH (ref 0.0–0.2)

## 2018-12-05 LAB — TYPE AND SCREEN
ABO/RH(D): O POS
Antibody Screen: POSITIVE
Unit division: 0

## 2018-12-05 LAB — BPAM RBC
BLOOD PRODUCT EXPIRATION DATE: 202002162359
ISSUE DATE / TIME: 202002032034
Unit Type and Rh: 5100

## 2018-12-05 NOTE — Progress Notes (Signed)
Here to perform NST.  Pt denies vaginal bleeding, LOF, or UC's and reports good fetal movement.  NST reactive, Category I.  Frequent fetal movement palpated. No UC's noted. Dr. Ilda Basset updated. Orders to change to NST QD.

## 2018-12-05 NOTE — Progress Notes (Addendum)
Subjective: Carmen [name], a 28 year old female with a medical history significant for sickle cell anemia, chronic pain syndrome, and third trimester pregnancy was admitted for sickle cell crisis.  Patient states that fatigue has resolved.  However, pain intensity is 8/10 primarily to upper and lower extremities. Patient is [redacted] weeks pregnant.  She denies vaginal discharge, bleeding, or cramping. Also, patient denies headache, shortness of breath, paresthesias, nausea, vomiting, or diarrhea.  Patient is afebrile and maintaining oxygen saturation above 90% on RA.  Objective:  Vital signs in last 24 hours:  Vitals:   12/05/18 0215 12/05/18 0433 12/05/18 0648 12/05/18 0854  BP: (!) 105/57  117/71   Pulse: 69  84   Resp: 14 16 16 16   Temp:   98.9 F (37.2 C)   TempSrc:   Oral   SpO2: 98% 100% 97% 99%  Weight:   78.3 kg   Height:        Intake/Output from previous day:   Intake/Output Summary (Last 24 hours) at 12/05/2018 0930 Last data filed at 12/05/2018 0650 Gross per 24 hour  Intake 3972.52 ml  Output 2100 ml  Net 1872.52 ml    Physical Exam: General: Alert, awake, oriented x3, in no acute distress.  HEENT: [place]/AT PEERL, EOMI Neck: Trachea midline,  no masses, no thyromegal,y no JVD, no carotid bruit OROPHARYNX:  Moist, No exudate/ erythema/lesions.  Heart: Regular rate and rhythm, without murmurs, rubs, gallops, PMI non-displaced, no heaves or thrills on palpation.  Lungs: Clear to auscultation, no wheezing or rhonchi noted. No increased vocal fremitus resonant to percussion  Abdomen: Soft, nontender, nondistended, positive bowel sounds, no masses no hepatosplenomegaly noted..  Neuro: No focal neurological deficits noted cranial nerves II through XII grossly intact. DTRs 2+ bilaterally upper and lower extremities. Strength 5 out of 5 in bilateral upper and lower extremities. Musculoskeletal: No warm swelling or erythema around joints, no spinal tenderness noted. Psychiatric:  Patient alert and oriented x3, good insight and cognition, good recent to remote recall. Lymph node survey: No cervical axillary or inguinal lymphadenopathy noted.  Lab Results:  Basic Metabolic Panel:    Component Value Date/Time   NA 135 12/05/2018 0348   NA 139 08/17/2018 0935   K 4.0 12/05/2018 0348   CL 106 12/05/2018 0348   CO2 23 12/05/2018 0348   BUN 8 12/05/2018 0348   BUN 9 08/17/2018 0935   CREATININE 0.44 12/05/2018 0348   GLUCOSE 87 12/05/2018 0348   CALCIUM 8.8 (L) 12/05/2018 0348   CBC:    Component Value Date/Time   WBC 12.6 (H) 12/05/2018 0348   HGB 8.2 (L) 12/05/2018 0348   HGB 7.5 (L) 11/29/2018 0841   HCT 24.3 (L) 12/05/2018 0348   HCT 21.8 (L) 11/29/2018 0841   PLT 453 (H) 12/05/2018 0348   PLT 582 (H) 11/29/2018 0841   MCV 93.5 12/05/2018 0348   MCV 92 11/29/2018 0841   NEUTROABS 7.3 12/05/2018 0348   NEUTROABS 7.9 (H) 09/15/2018 0933   LYMPHSABS 3.2 12/05/2018 0348   LYMPHSABS 2.4 09/15/2018 0933   MONOABS 1.6 (H) 12/05/2018 0348   EOSABS 0.3 12/05/2018 0348   EOSABS 0.1 09/15/2018 0933   BASOSABS 0.0 12/05/2018 0348   BASOSABS 0.0 09/15/2018 0933    No results found for this or any previous visit (from the past 240 hour(s)).  Studies/Results: No results found.  Medications: Scheduled Meds: . aspirin EC  81 mg Oral Daily  . feeding supplement (ENSURE ENLIVE)  237 mL Oral BID BM  .  folic acid  1 mg Oral Daily  . HYDROmorphone   Intravenous Q4H  . morphine  30 mg Oral Q12H  . prenatal multivitamin  1 tablet Oral Daily  . senna-docusate  1 tablet Oral BID   Continuous Infusions: . sodium chloride 125 mL/hr at 12/05/18 0902   PRN Meds:.diphenhydrAMINE, naloxone **AND** sodium chloride flush, ondansetron (ZOFRAN) IV, polyethylene glycol  Assessment/Plan: Active Problems:   Anemia of chronic disease   Supervision of high risk pregnancy, antepartum   Sickle cell pain crisis ([place])   Alpha thalassemia silent carrier  Sickle cell  anemia with pain crisis: Continue weight-based PCA Dilaudid Maintain oxygen saturation above 90% Reevaluate pain scale regularly  Anemia of chronic disease: Hemoglobin 8.2, patient status post blood transfusion.  Patient received 1 unit of packed red blood cells on 12/04/2018. Continue to follow CBC  Leukocytosis: Stable.  Patient afebrile.  No signs of infection Follow-up CBC  Chronic pain syndrome: Continue MS Contin 30 mg every 12 hours Continue to hold oxycodone  Third trimester pregnancy: Continue prenatal vitamins Follow-up by obstetrics, daily FHTs  Code Status: Full Code Family Communication: N/A Disposition Plan: Not yet ready for discharge  [place], MSN, FNP-C Patient [place], [place] [address] [phone]  If 5PM-7AM, please contact night-coverage.  12/05/2018, 9:30 AM  LOS: 1 day

## 2018-12-06 NOTE — Progress Notes (Signed)
Subjective: A 28 year old female with history of sickle cell disease and 29 weeks pregnancy admitted with sickle cell painful crisis.  Patient is complaining of 8 out of 10 pain still in her back and legs.  She is able to walk to the bathroom but not outside the room.  No fever or chills no nausea vomiting or diarrhea.  Patient's baby has been checked repeatedly and is doing fine.  She is on the Dilaudid PCA and has used 28 mg in the last 24 hours.  Objective: Vital signs in last 24 hours: Temp:  [97.7 F (36.5 C)-98.2 F (36.8 C)] 97.7 F (36.5 C) (02/05 0949) Pulse Rate:  [67-78] 78 (02/05 0949) Resp:  [12-22] 14 (02/05 1945) BP: (110-115)/(60-76) 110/67 (02/05 0949) SpO2:  [96 %-100 %] 99 % (02/05 1818) Weight:  [78.6 kg] 78.6 kg (02/05 0746) Weight change:  Last BM Date: 12/05/18  Intake/Output from previous day: 02/04 0701 - 02/05 0700 In: 720 [P.O.:720] Out: 1350 [Urine:1350] Intake/Output this shift: No intake/output data recorded.  General appearance: alert, cooperative, appears stated age and no distress Neck: no adenopathy, no carotid bruit, no JVD, supple, symmetrical, trachea midline and thyroid not enlarged, symmetric, no tenderness/mass/nodules Back: symmetric, no curvature. ROM normal. No CVA tenderness. Resp: clear to auscultation bilaterally Cardio: regular rate and rhythm, S1, S2 normal, no murmur, click, rub or gallop Extremities: extremities normal, atraumatic, no cyanosis or edema Neurologic: Grossly normal  Lab Results: Recent Labs    12/04/18 0013 12/05/18 0348  WBC 16.2* 12.6*  HGB 7.4* 8.2*  HCT 21.3* 24.3*  PLT 522* 453*   BMET Recent Labs    12/04/18 0013 12/05/18 0348  NA 141 135  K 3.4* 4.0  CL 112* 106  CO2 21* 23  GLUCOSE 85 87  BUN 8 8  CREATININE 0.50 0.44  CALCIUM 8.8* 8.8*    Studies/Results: No results found.  Medications: I have reviewed the patient's current medications.  Assessment/Plan: A 28 year old female  admitted with sickle cell painful crisis.  #1 sickle cell painful crisis: We will continue with current Dilaudid PCA for the patient.  No Toradol due to pregnancy.  She is slowly improving but not at goal.  #2 anemia of chronic disease: We will has remained stable in this pregnant woman.  We will try and get recheck in the morning.  #3 hypokalemia: This has been repleted.  #4 nausea with vomiting: Patient reported having nausea and vomiting since last night.  Symptomatic treatment and IV fluids for now.  #5 3rd trimester pregnancy: Appears to be going on well.  Appreciate OB/GYN input.  LOS: 2 days   Carmen [name],Carmen [name] 12/06/2018, 8:53 PM

## 2018-12-06 NOTE — Progress Notes (Signed)
NST performed [phone] on this 28 yo G2P0 @ 29.[redacted] wks GA in with sickle cell crisis.  See labor flow sheet. Category I, no UC's.  Dr. Ilda Basset updated on patient.

## 2018-12-07 LAB — BASIC METABOLIC PANEL
Anion gap: 9 (ref 5–15)
BUN: 10 mg/dL (ref 6–20)
CO2: 23 mmol/L (ref 22–32)
Calcium: 9.2 mg/dL (ref 8.9–10.3)
Chloride: 103 mmol/L (ref 98–111)
Creatinine, Ser: 0.42 mg/dL — ABNORMAL LOW (ref 0.44–1.00)
GFR calc Af Amer: >60 mL/min (ref >60)
GFR calc non Af Amer: >60 mL/min (ref >60)
Glucose, Bld: 103 mg/dL — ABNORMAL HIGH (ref 70–99)
POTASSIUM: 4.3 mmol/L (ref 3.5–5.1)
Sodium: 135 mmol/L (ref 135–145)

## 2018-12-07 LAB — CBC
HCT: 22.9 % — ABNORMAL LOW (ref 36.0–46.0)
Hemoglobin: 8 g/dL — ABNORMAL LOW (ref 12.0–15.0)
MCH: 31.1 pg (ref 26.0–34.0)
MCHC: 34.9 g/dL (ref 30.0–36.0)
MCV: 89.1 fL (ref 80.0–100.0)
Platelets: 453 10*3/uL — ABNORMAL HIGH (ref 150–400)
RBC: 2.57 MIL/uL — ABNORMAL LOW (ref 3.87–5.11)
RDW: 16.5 % — ABNORMAL HIGH (ref 11.5–15.5)
WBC: 12.9 10*3/uL — AB (ref 4.0–10.5)
nRBC: 8.3 % — ABNORMAL HIGH (ref 0.0–0.2)

## 2018-12-07 MED ORDER — OXYCODONE HCL 5 MG PO TABS
10.0000 mg | ORAL_TABLET | ORAL | Status: DC | PRN
  Administered 2018-12-07 – 2018-12-11 (×10): 10 mg via ORAL
  Filled 2018-12-07 (×10): qty 2

## 2018-12-07 MED ORDER — MORPHINE SULFATE ER 30 MG PO TBCR: 30.0000 mg | EXTENDED_RELEASE_TABLET | Freq: Three times a day (TID) | ORAL | 0 refills | Status: DC | PRN

## 2018-12-07 NOTE — Progress Notes (Signed)
Subjective: Patient states that her pain is not been controlled overnight despite Dilaudid PCA and MS Contin.  Patient states that pain intensity is 8/10 primarily to lower back and lower extremities.  She characterizes pain as constant and throbbing.  She states that she is unable to get comfortable.  Patient is currently [redacted] weeks pregnant.  Patient followed by obstetrics.  Patient states that she feels the baby moving.  She denies vaginal discharge, vaginal bleeding, or cramping.  Objective:  Vital signs in last 24 hours:  Vitals:   12/07/18 0448 12/07/18 0700 12/07/18 0831 12/07/18 0932  BP:    130/76  Pulse:    79  Resp: 14  14 10   Temp:    98.4 F (36.9 C)  TempSrc:    Oral  SpO2: 98%  99% 96%  Weight:  79.2 kg    Height:        Intake/Output from previous day:   Intake/Output Summary (Last 24 hours) at 12/07/2018 1015 Last data filed at 12/07/2018 0941 Gross per 24 hour  Intake 2890.89 ml  Output 2300 ml  Net 590.89 ml    Physical Exam: General: Alert, awake, oriented x3, in no acute distress.  HEENT: /AT PEERL, EOMI Neck: Trachea midline,  no masses, no thyromegal,y no JVD, no carotid bruit OROPHARYNX:  Moist, No exudate/ erythema/lesions.  Heart: Regular rate and rhythm, without murmurs, rubs, gallops, PMI non-displaced, no heaves or thrills on palpation.  Lungs: Clear to auscultation, no wheezing or rhonchi noted. No increased vocal fremitus resonant to percussion  Abdomen: Soft, nontender, nondistended, positive bowel sounds, no masses no hepatosplenomegaly noted..  Neuro: No focal neurological deficits noted cranial nerves II through XII grossly intact. DTRs 2+ bilaterally upper and lower extremities. Strength 5 out of 5 in bilateral upper and lower extremities. Musculoskeletal: No warm swelling or erythema around joints, no spinal tenderness noted. Psychiatric: Patient alert and oriented x3, good insight and cognition, good recent to remote recall. Lymph node  survey: No cervical axillary or inguinal lymphadenopathy noted.  Lab Results:  Basic Metabolic Panel:    Component Value Date/Time   NA 135 12/07/2018 0945   NA 139 08/17/2018 0935   K 4.3 12/07/2018 0945   CL 103 12/07/2018 0945   CO2 23 12/07/2018 0945   BUN 10 12/07/2018 0945   BUN 9 08/17/2018 0935   CREATININE 0.42 (L) 12/07/2018 0945   GLUCOSE 103 (H) 12/07/2018 0945   CALCIUM 9.2 12/07/2018 0945   CBC:    Component Value Date/Time   WBC 12.9 (H) 12/07/2018 0945   HGB 8.0 (L) 12/07/2018 0945   HGB 7.5 (L) 11/29/2018 0841   HCT 22.9 (L) 12/07/2018 0945   HCT 21.8 (L) 11/29/2018 0841   PLT 453 (H) 12/07/2018 0945   PLT 582 (H) 11/29/2018 0841   MCV 89.1 12/07/2018 0945   MCV 92 11/29/2018 0841   NEUTROABS 7.3 12/05/2018 0348   NEUTROABS 7.9 (H) 09/15/2018 0933   LYMPHSABS 3.2 12/05/2018 0348   LYMPHSABS 2.4 09/15/2018 0933   MONOABS 1.6 (H) 12/05/2018 0348   EOSABS 0.3 12/05/2018 0348   EOSABS 0.1 09/15/2018 0933   BASOSABS 0.0 12/05/2018 0348   BASOSABS 0.0 09/15/2018 0933    No results found for this or any previous visit (from the past 240 hour(s)).  Studies/Results: No results found.  Medications: Scheduled Meds: . aspirin EC  81 mg Oral Daily  . feeding supplement (ENSURE ENLIVE)  237 mL Oral BID BM  . folic acid  1 mg Oral Daily  . HYDROmorphone   Intravenous Q4H  . morphine  30 mg Oral Q12H  . prenatal multivitamin  1 tablet Oral Daily  . senna-docusate  1 tablet Oral BID   Continuous Infusions: . sodium chloride 10 mL/hr at 12/07/18 0450   PRN Meds:.diphenhydrAMINE, naloxone **AND** sodium chloride flush, ondansetron (ZOFRAN) IV, oxyCODONE, polyethylene glycol  Consultants:  Obstetrics  Assessment/Plan: Active Problems:   Anemia of chronic disease   Supervision of high risk pregnancy, antepartum   Sickle cell pain crisis ([place])   Alpha thalassemia silent carrier  Sickle cell anemia with pain crisis: Continue Dilaudid PCA Continue IV  fluids at [facility] Oxycodone 10 mg every 4 hours as needed for moderate to severe breakthrough pain  Anemia of chronic disease: Hemoglobin stable.  Patient status post blood transfusion on 12/04/2018 CBC in a.m.  Leukocytosis: WBCs stable.  No signs of infection.  Chronic pain syndrome: Continue MS Contin 30 mg every 12 hours Oxycodone 10 mg every 4 hours as needed  Third trimester pregnancy: Continue prenatal vitamins Patient followed by obstetrics  Code Status: Full Code Family Communication: N/A Disposition Plan: Not yet ready for discharge  [place], MSN, FNP-C Patient [place], [place] [address] [phone]   If 5PM-7AM, please contact night-coverage.  12/07/2018, 10:15 AM  LOS: 3 days

## 2018-12-07 NOTE — Progress Notes (Signed)
NST performed from 1031-1104, see labor flowsheet.  Dr Ilda Basset made aware that fhr only had 10x10 accelerations which is consistant with a baby of this gestation whose mom is currently on narcotics.  MD says he is alright with this tracing.  Pt reports no vaginal bleeding or leaking of fluid.

## 2018-12-07 NOTE — Telephone Encounter (Signed)
Refilled

## 2018-12-08 ENCOUNTER — Telehealth: Payer: Self-pay

## 2018-12-08 LAB — CBC WITH DIFFERENTIAL/PLATELET
Abs Immature Granulocytes: 0.5 10*3/uL — ABNORMAL HIGH (ref 0.00–0.07)
BASOS ABS: 0.1 10*3/uL (ref 0.0–0.1)
Basophils Relative: 1 %
Eosinophils Absolute: 0.3 10*3/uL (ref 0.0–0.5)
Eosinophils Relative: 2 %
HCT: 21.8 % — ABNORMAL LOW (ref 36.0–46.0)
Hemoglobin: 7.6 g/dL — ABNORMAL LOW (ref 12.0–15.0)
Immature Granulocytes: 4 %
Lymphocytes Relative: 15 %
Lymphs Abs: 1.9 10*3/uL (ref 0.7–4.0)
MCH: 31.5 pg (ref 26.0–34.0)
MCHC: 34.9 g/dL (ref 30.0–36.0)
MCV: 90.5 fL (ref 80.0–100.0)
Monocytes Absolute: 1.8 10*3/uL — ABNORMAL HIGH (ref 0.1–1.0)
Monocytes Relative: 14 %
NEUTROS PCT: 64 %
NRBC: 8.6 % — AB (ref 0.0–0.2)
Neutro Abs: 8.5 10*3/uL — ABNORMAL HIGH (ref 1.7–7.7)
Platelets: 412 10*3/uL — ABNORMAL HIGH (ref 150–400)
RBC: 2.41 MIL/uL — AB (ref 3.87–5.11)
RDW: 16.4 % — ABNORMAL HIGH (ref 11.5–15.5)
WBC: 13.1 10*3/uL — AB (ref 4.0–10.5)

## 2018-12-08 LAB — BASIC METABOLIC PANEL
Anion gap: 9 (ref 5–15)
BUN: 12 mg/dL (ref 6–20)
CALCIUM: 9 mg/dL (ref 8.9–10.3)
CO2: 22 mmol/L (ref 22–32)
Chloride: 104 mmol/L (ref 98–111)
Creatinine, Ser: 0.4 mg/dL — ABNORMAL LOW (ref 0.44–1.00)
GFR calc non Af Amer: >60 mL/min (ref >60)
Glucose, Bld: 89 mg/dL (ref 70–99)
Potassium: 3.8 mmol/L (ref 3.5–5.1)
Sodium: 135 mmol/L (ref 135–145)

## 2018-12-08 LAB — LACTATE DEHYDROGENASE: LDH: 295 U/L — ABNORMAL HIGH (ref 98–192)

## 2018-12-08 NOTE — Progress Notes (Signed)
Patient complaining of chest pain. Vital signs obtained. NT also instructed to obtain an EKG while I paged the MD.   Patient denies shortness of breath, reflux, or heartburn.   Carmen Hollis, NP on call for [facility] called, patient assessment reported to NP.   Hollis, Np instructed to call her back after the EKG was completed.   EKG shows patient in NSR. EKG placed in patient's shadow chart at this time.   Carmen [name] other suggestions as this time were to instruct patient to control her pain with her PCA, as well as oral medications, and control nausea and heart burn as needed.   Communication received and patient was instructed of NP recommendations.   Will continue to monitor.

## 2018-12-08 NOTE — Progress Notes (Cosign Needed)
Subjective: Patient states that she does not feel well today.  She endorses discomfort to mid chest and increased low back pain.  Current pain intensity 9/10.  Patient characterizes pain as constant and occasionally sharp.   Patient is currently [redacted] weeks pregnant.  Patient followed by obstetrics.   She denies vaginal discharge, vaginal bleeding, or cramping.  Objective:  Vital signs in last 24 hours:  Vitals:   12/08/18 0835 12/08/18 0939 12/08/18 1136 12/08/18 1308  BP: 125/74 122/73  (!) 119/59  Pulse: 86 84  84  Resp: 18 14 16 18   Temp:  98.5 F (36.9 C)  98.9 F (37.2 C)  TempSrc:  Oral  Oral  SpO2: 98% 100% 97% 96%  Weight:      Height:        Intake/Output from previous day:   Intake/Output Summary (Last 24 hours) at 12/08/2018 1454 Last data filed at 12/08/2018 0900 Gross per 24 hour  Intake 1493.4 ml  Output 1050 ml  Net 443.4 ml   Physical Exam Constitutional:      General: She is not in acute distress. HENT:     Mouth/Throat:     Mouth: Mucous membranes are moist.  Cardiovascular:     Rate and Rhythm: Normal rate.  Pulmonary:     Effort: Pulmonary effort is normal.  Abdominal:     General: Bowel sounds are normal.  Musculoskeletal: Normal range of motion.  Skin:    General: Skin is warm and dry.  Neurological:     General: No focal deficit present.     Mental Status: Mental status is at baseline.  Psychiatric:        Mood and Affect: Mood normal.        Behavior: Behavior normal.        Thought Content: Thought content normal.        Judgment: Judgment normal.     Lab Results:  Basic Metabolic Panel:    Component Value Date/Time   NA 135 12/08/2018 0957   NA 139 08/17/2018 0935   K 3.8 12/08/2018 0957   CL 104 12/08/2018 0957   CO2 22 12/08/2018 0957   BUN 12 12/08/2018 0957   BUN 9 08/17/2018 0935   CREATININE 0.40 (L) 12/08/2018 0957   GLUCOSE 89 12/08/2018 0957   CALCIUM 9.0 12/08/2018 0957   CBC:    Component Value Date/Time    WBC 13.1 (H) 12/08/2018 0957   HGB 7.6 (L) 12/08/2018 0957   HGB 7.5 (L) 11/29/2018 0841   HCT 21.8 (L) 12/08/2018 0957   HCT 21.8 (L) 11/29/2018 0841   PLT 412 (H) 12/08/2018 0957   PLT 582 (H) 11/29/2018 0841   MCV 90.5 12/08/2018 0957   MCV 92 11/29/2018 0841   NEUTROABS 8.5 (H) 12/08/2018 0957   NEUTROABS 7.9 (H) 09/15/2018 0933   LYMPHSABS 1.9 12/08/2018 0957   LYMPHSABS 2.4 09/15/2018 0933   MONOABS 1.8 (H) 12/08/2018 0957   EOSABS 0.3 12/08/2018 0957   EOSABS 0.1 09/15/2018 0933   BASOSABS 0.1 12/08/2018 0957   BASOSABS 0.0 09/15/2018 0933    No results found for this or any previous visit (from the past 240 hour(s)).  Studies/Results: No results found.  Medications: Scheduled Meds: . aspirin EC  81 mg Oral Daily  . feeding supplement (ENSURE ENLIVE)  237 mL Oral BID BM  . folic acid  1 mg Oral Daily  . HYDROmorphone   Intravenous Q4H  . morphine  30 mg Oral Q12H  .  prenatal multivitamin  1 tablet Oral Daily  . senna-docusate  1 tablet Oral BID   Continuous Infusions: . sodium chloride 10 mL/hr at 12/08/18 0900   PRN Meds:.diphenhydrAMINE, naloxone **AND** sodium chloride flush, ondansetron (ZOFRAN) IV, oxyCODONE, polyethylene glycol  Consultants:  Obstetrics  Assessment/Plan: Active Problems:   Anemia of chronic disease   Supervision of high risk pregnancy, antepartum   Sickle cell pain crisis ([place])   Alpha thalassemia silent carrier  Sickle cell anemia with pain crisis: Continue Dilaudid PCA Continue [facility] fluids at [facility] Oxycodone 10 mg every 4 hours as needed for moderate to severe breakthrough pain  Anemia of chronic disease: Hemoglobin 7.6.   Patient status post blood transfusion on 12/04/2018 CBC in a.m.  Leukocytosis: WBCs stable.  No signs of infection.  Chronic pain syndrome: Continue MS Contin 30 mg every 12 hours Oxycodone 10 mg every 4 hours as needed  Third trimester pregnancy: Continue prenatal vitamins Patient followed by  obstetrics  Code Status: Full Code Family Communication: N/A Disposition Plan: Not yet ready for discharge  [place], MSN, FNP-C Patient [address] [place], [place] [address] [phone]   If 5PM-7AM, please contact night-coverage.  12/08/2018, 2:54 PM  LOS: 4 days

## 2018-12-08 NOTE — Telephone Encounter (Signed)
Called and spoke with patient, advised that medication was refilled yesterday. Patient verbalized understanding. Thanks!

## 2018-12-08 NOTE — Progress Notes (Signed)
Here to perform NST for this 28 yo G2P0 @ 29.[redacted] wks GA in with sickle cell crisis.  Pt denies vaginal bleeding or LOF or UC;s and reports frequent fetal movement.

## 2018-12-09 LAB — CBC WITH DIFFERENTIAL/PLATELET
Abs Immature Granulocytes: 0.56 10*3/uL — ABNORMAL HIGH (ref 0.00–0.07)
Basophils Absolute: 0.1 10*3/uL (ref 0.0–0.1)
Basophils Relative: 0 %
Eosinophils Absolute: 0.4 10*3/uL (ref 0.0–0.5)
Eosinophils Relative: 3 %
HCT: 21.4 % — ABNORMAL LOW (ref 36.0–46.0)
Hemoglobin: 7.3 g/dL — ABNORMAL LOW (ref 12.0–15.0)
Immature Granulocytes: 4 %
Lymphocytes Relative: 12 %
Lymphs Abs: 1.7 10*3/uL (ref 0.7–4.0)
MCH: 32 pg (ref 26.0–34.0)
MCHC: 34.1 g/dL (ref 30.0–36.0)
MCV: 93.9 fL (ref 80.0–100.0)
MONO ABS: 2.1 10*3/uL — AB (ref 0.1–1.0)
Monocytes Relative: 15 %
Neutro Abs: 9.7 10*3/uL — ABNORMAL HIGH (ref 1.7–7.7)
Neutrophils Relative %: 66 %
Platelets: 402 10*3/uL — ABNORMAL HIGH (ref 150–400)
RBC: 2.28 MIL/uL — ABNORMAL LOW (ref 3.87–5.11)
RDW: 16.5 % — ABNORMAL HIGH (ref 11.5–15.5)
WBC: 14.6 10*3/uL — ABNORMAL HIGH (ref 4.0–10.5)
nRBC: 9.9 % — ABNORMAL HIGH (ref 0.0–0.2)

## 2018-12-09 LAB — COMPREHENSIVE METABOLIC PANEL
ALT: 46 U/L — ABNORMAL HIGH (ref 0–44)
ANION GAP: 7 (ref 5–15)
AST: 58 U/L — ABNORMAL HIGH (ref 15–41)
Albumin: 2.8 g/dL — ABNORMAL LOW (ref 3.5–5.0)
Alkaline Phosphatase: 174 U/L — ABNORMAL HIGH (ref 38–126)
BUN: 13 mg/dL (ref 6–20)
CO2: 24 mmol/L (ref 22–32)
Calcium: 9.1 mg/dL (ref 8.9–10.3)
Chloride: 104 mmol/L (ref 98–111)
Creatinine, Ser: 0.44 mg/dL (ref 0.44–1.00)
GFR calc non Af Amer: >60 mL/min (ref >60)
GLUCOSE: 101 mg/dL — AB (ref 70–99)
Potassium: 3.8 mmol/L (ref 3.5–5.1)
SODIUM: 135 mmol/L (ref 135–145)
Total Bilirubin: 2.4 mg/dL — ABNORMAL HIGH (ref 0.3–1.2)
Total Protein: 6.9 g/dL (ref 6.5–8.1)

## 2018-12-09 LAB — PREPARE RBC (CROSSMATCH)

## 2018-12-09 MED ORDER — HYDROMORPHONE HCL 2 MG/ML IJ SOLN: 2.0000 mg | INTRAMUSCULAR | Status: DC | PRN

## 2018-12-09 MED ORDER — SODIUM CHLORIDE 0.9% IV SOLUTION
Freq: Once | INTRAVENOUS | Status: AC
  Administered 2018-12-10: 01:00:00 via INTRAVENOUS

## 2018-12-09 NOTE — Progress Notes (Addendum)
Subjective: Patient is doing better with her pain but hemoglobin has dropped.  Being that she is pregnant she is more comfortable with hemoglobin of between 8 to 9 g.  She is already been transfused this admission and hemoglobin today is 7.2.  Otherwise no nausea vomiting or diarrhea.  Objective: Vital signs in last 24 hours: Temp:  [97.9 F (36.6 C)-98.9 F (37.2 C)] 98.4 F (36.9 C) (02/08 0953) Pulse Rate:  [84-109] 89 (02/08 0953) Resp:  [12-20] 16 (02/08 1213) BP: (110-128)/(59-96) 114/61 (02/08 0953) SpO2:  [96 %-100 %] 100 % (02/08 1213) Weight change:  Last BM Date: 12/08/18  Intake/Output from previous day: 02/07 0701 - 02/08 0700 In: 1654.7 [P.O.:1060; I.V.:594.7] Out: -  Intake/Output this shift: No intake/output data recorded.  General appearance: alert, cooperative, appears stated age and no distress Neck: no adenopathy, no carotid bruit, no JVD, supple, symmetrical, trachea midline and thyroid not enlarged, symmetric, no tenderness/mass/nodules Back: symmetric, no curvature. ROM normal. No CVA tenderness. Resp: clear to auscultation bilaterally Cardio: regular rate and rhythm, S1, S2 normal, no murmur, click, rub or gallop Extremities: extremities normal, atraumatic, no cyanosis or edema Neurologic: Grossly normal  Lab Results: Recent Labs    12/08/18 0957 12/09/18 0937  WBC 13.1* 14.6*  HGB 7.6* 7.3*  HCT 21.8* 21.4*  PLT 412* 402*   BMET Recent Labs    12/08/18 0957 12/09/18 0937  NA 135 135  K 3.8 3.8  CL 104 104  CO2 22 24  GLUCOSE 89 101*  BUN 12 13  CREATININE 0.40* 0.44  CALCIUM 9.0 9.1    Studies/Results: No results found.  Medications: I have reviewed the patient's current medications.  Assessment/Plan: A 28 year old female admitted with sickle cell painful crisis.  #1 sickle cell painful crisis: Pain is much better controlled at the moment.  Is down to 5 out of 10.  We will continue oral as well as IV Dilaudid PCA  #2 anemia of  chronic disease: We will transfuse 1 unit of packed red blood cells.  Goal is hemoglobin between 8 to 9 g.  #3 hypokalemia: This has been repleted.  #4 nausea with vomiting: Patient reported having nausea and vomiting since last night.  Symptomatic treatment and IV fluids for now.  #5 3rd trimester pregnancy: Appears to be going on well.  Appreciate OB/GYN input.   LOS: 5 days   ,LAWAL 12/09/2018, 12:30 PM

## 2018-12-09 NOTE — Progress Notes (Signed)
Spoke with Dr. Rosana Hoes. FHR tracing is a category 1 with no uc's. No vaginal bleeding or leaking of fluid.

## 2018-12-10 LAB — CBC
HCT: 24.5 % — ABNORMAL LOW (ref 36.0–46.0)
Hemoglobin: 8.1 g/dL — ABNORMAL LOW (ref 12.0–15.0)
MCH: 31.2 pg (ref 26.0–34.0)
MCHC: 33.1 g/dL (ref 30.0–36.0)
MCV: 94.2 fL (ref 80.0–100.0)
Platelets: 388 10*3/uL (ref 150–400)
RBC: 2.6 MIL/uL — ABNORMAL LOW (ref 3.87–5.11)
RDW: 16.6 % — ABNORMAL HIGH (ref 11.5–15.5)
WBC: 15.9 10*3/uL — ABNORMAL HIGH (ref 4.0–10.5)
nRBC: 8.4 % — ABNORMAL HIGH (ref 0.0–0.2)

## 2018-12-10 NOTE — Progress Notes (Signed)
Spoke with Dr. Rip Harbour. FHR tracing is a category 1 with no uc's. No vaginal bleeding or leaking of fluid.

## 2018-12-10 NOTE — Progress Notes (Signed)
Subjective: Patient has been transfused 1 unit of packed red blood cells yesterday.  Hemoglobin is up to 8.1 today.  She is still feeling pain at 5 out of 10 but generally weak.  Objective: Vital signs in last 24 hours: Temp:  [98.1 F (36.7 C)-98.7 F (37.1 C)] 98.7 F (37.1 C) (02/09 1815) Pulse Rate:  [80-105] 90 (02/09 1815) Resp:  [12-20] 20 (02/09 1815) BP: (116-135)/(64-93) 127/84 (02/09 1815) SpO2:  [94 %-100 %] 100 % (02/09 1815) Weight change:  Last BM Date: 12/10/18  Intake/Output from previous day: 02/08 0701 - 02/09 0700 In: 1702.9 [P.O.:1080; I.V.:263.7; Blood:359.2] Out: -  Intake/Output this shift: No intake/output data recorded.  General appearance: alert, cooperative, appears stated age and no distress Neck: no adenopathy, no carotid bruit, no JVD, supple, symmetrical, trachea midline and thyroid not enlarged, symmetric, no tenderness/mass/nodules Back: symmetric, no curvature. ROM normal. No CVA tenderness. Resp: clear to auscultation bilaterally Cardio: regular rate and rhythm, S1, S2 normal, no murmur, click, rub or gallop Extremities: extremities normal, atraumatic, no cyanosis or edema Neurologic: Grossly normal  Lab Results: Recent Labs    12/09/18 0937 12/10/18 1158  WBC 14.6* 15.9*  HGB 7.3* 8.1*  HCT 21.4* 24.5*  PLT 402* 388   BMET Recent Labs    12/08/18 0957 12/09/18 0937  NA 135 135  K 3.8 3.8  CL 104 104  CO2 22 24  GLUCOSE 89 101*  BUN 12 13  CREATININE 0.40* 0.44  CALCIUM 9.0 9.1    Studies/Results: No results found.  Medications: I have reviewed the patient's current medications.  Assessment/Plan: A 28 year old female admitted with sickle cell painful crisis.  #1 sickle cell painful crisis: Pain is close to her baseline now.  She is functional able to walk around.  Hemoglobin has been at goal of 8.1 today.  Recheck in the morning and if is staying up patient will be discharged home  #2 anemia of chronic disease:  Patient has been transfused 1 unit of packed red blood cells again.  We will recheck her hemoglobin in the morning to ensure that it is stable if so should be discharged home.  #3 hypokalemia: This has been repleted.  #4 nausea with vomiting: Patient reported having nausea and vomiting since last night.  Symptomatic treatment and IV fluids for now.  #5 3rd trimester pregnancy: Appears to be going on well.  Appreciate OB/GYN input.   LOS: 6 days   ,LAWAL 12/10/2018, 7:08 PM

## 2018-12-11 LAB — COMPREHENSIVE METABOLIC PANEL
ALT: 75 U/L — ABNORMAL HIGH (ref 0–44)
AST: 75 U/L — ABNORMAL HIGH (ref 15–41)
Albumin: 2.8 g/dL — ABNORMAL LOW (ref 3.5–5.0)
Alkaline Phosphatase: 202 U/L — ABNORMAL HIGH (ref 38–126)
Anion gap: 7 (ref 5–15)
BUN: 17 mg/dL (ref 6–20)
CHLORIDE: 103 mmol/L (ref 98–111)
CO2: 24 mmol/L (ref 22–32)
Calcium: 9.3 mg/dL (ref 8.9–10.3)
Creatinine, Ser: 0.48 mg/dL (ref 0.44–1.00)
GFR calc Af Amer: >60 mL/min (ref >60)
GFR calc non Af Amer: >60 mL/min (ref >60)
Glucose, Bld: 87 mg/dL (ref 70–99)
POTASSIUM: 4 mmol/L (ref 3.5–5.1)
SODIUM: 134 mmol/L — AB (ref 135–145)
Total Bilirubin: 2.2 mg/dL — ABNORMAL HIGH (ref 0.3–1.2)
Total Protein: 7.2 g/dL (ref 6.5–8.1)

## 2018-12-11 LAB — BPAM RBC
Blood Product Expiration Date: 202003122359
ISSUE DATE / TIME: 202002090105
Unit Type and Rh: 5100

## 2018-12-11 LAB — TYPE AND SCREEN
ABO/RH(D): O POS
Antibody Screen: POSITIVE
Unit division: 0

## 2018-12-11 LAB — CBC WITH DIFFERENTIAL/PLATELET
Abs Immature Granulocytes: 0.51 10*3/uL — ABNORMAL HIGH (ref 0.00–0.07)
BASOS PCT: 1 %
Basophils Absolute: 0.1 10*3/uL (ref 0.0–0.1)
Eosinophils Absolute: 0.4 10*3/uL (ref 0.0–0.5)
Eosinophils Relative: 3 %
HCT: 23.2 % — ABNORMAL LOW (ref 36.0–46.0)
Hemoglobin: 7.7 g/dL — ABNORMAL LOW (ref 12.0–15.0)
Immature Granulocytes: 4 %
Lymphocytes Relative: 18 %
Lymphs Abs: 2.3 10*3/uL (ref 0.7–4.0)
MCH: 30.7 pg (ref 26.0–34.0)
MCHC: 33.2 g/dL (ref 30.0–36.0)
MCV: 92.4 fL (ref 80.0–100.0)
MONOS PCT: 15 %
Monocytes Absolute: 1.9 10*3/uL — ABNORMAL HIGH (ref 0.1–1.0)
Neutro Abs: 8 10*3/uL — ABNORMAL HIGH (ref 1.7–7.7)
Neutrophils Relative %: 59 %
Platelets: 399 10*3/uL (ref 150–400)
RBC: 2.51 MIL/uL — ABNORMAL LOW (ref 3.87–5.11)
RDW: 16.8 % — ABNORMAL HIGH (ref 11.5–15.5)
WBC: 13.2 10*3/uL — ABNORMAL HIGH (ref 4.0–10.5)
nRBC: 8.9 % — ABNORMAL HIGH (ref 0.0–0.2)

## 2018-12-11 MED ORDER — HYDROMORPHONE 1 MG/ML IV SOLN
INTRAVENOUS | Status: DC
  Administered 2018-12-11: 1.2 mg via INTRAVENOUS
  Administered 2018-12-11: 30 mg via INTRAVENOUS
  Administered 2018-12-11: 4.5 mg via INTRAVENOUS
  Administered 2018-12-11: 3.6 mg via INTRAVENOUS
  Administered 2018-12-11: 3 mg via INTRAVENOUS
  Administered 2018-12-12: 1.5 mg via INTRAVENOUS
  Administered 2018-12-12: 6.9 mg via INTRAVENOUS
  Administered 2018-12-12 (×2): 6.3 mg via INTRAVENOUS
  Administered 2018-12-12: 30 mg via INTRAVENOUS
  Administered 2018-12-13: 0.9 mg via INTRAVENOUS
  Administered 2018-12-13: 0.6 mg via INTRAVENOUS
  Filled 2018-12-11 (×2): qty 30

## 2018-12-11 MED ORDER — OXYCODONE HCL 5 MG PO TABS
10.0000 mg | ORAL_TABLET | ORAL | Status: DC
  Administered 2018-12-11 – 2018-12-13 (×9): 10 mg via ORAL
  Filled 2018-12-11 (×11): qty 2

## 2018-12-11 NOTE — Progress Notes (Signed)
Apple juice given to pt. No 10x10 accels noted. Pt says she is feeling the baby move. Audible fetal movement. No vaginal bleeding or leaking of fluid.Pt's IVF infiltrated so she no longer has an IV.

## 2018-12-11 NOTE — Progress Notes (Signed)
Spoke with Dr. Harolyn Rutherford. FHR baseline 145 BPM, moderate variability, 10x10 accels noted after pt drinking apple juice. No decels, vaginal bleeding or leaking of fluid.

## 2018-12-11 NOTE — Progress Notes (Addendum)
Subjective: Carmen [name], a 28 year old female in third trimester of pregnancy that was admitted and sickle cell pain crisis.  Patient states that pain is not improved very much throughout admission.  She endorses fatigue.  Patient is status post 2 units of packed red blood cells.  Hemoglobin is 7.7 this a.m., which is consistent with baseline.  Patient afebrile and maintaining oxygen saturation above 90% on RA. Patient continues to be followed by obstetrics.  She denies vaginal bleeding, vaginal discharge, or abdominal cramping.  Objective:  Vital signs in last 24 hours:  Vitals:   12/11/18 0409 12/11/18 0608 12/11/18 0844 12/11/18 0940  BP:  116/70  111/64  Pulse:  78  88  Resp: 16 19 19 20   Temp:  98.4 F (36.9 C)  99 F (37.2 C)  TempSrc:  Oral  Oral  SpO2: 98%  97% 97%  Weight:      Height:        Intake/Output from previous day:   Intake/Output Summary (Last 24 hours) at 12/11/2018 1029 Last data filed at 12/11/2018 0943 Gross per 24 hour  Intake 1260.07 ml  Output 1 ml  Net 1259.07 ml    Physical Exam: General: Alert, awake, oriented x3, in no acute distress.  HEENT: [place]/AT PEERL, EOMI Neck: Trachea midline,  no masses, no thyromegal,y no JVD, no carotid bruit OROPHARYNX:  Moist, No exudate/ erythema/lesions.  Heart: Regular rate and rhythm, without murmurs, rubs, gallops, PMI non-displaced, no heaves or thrills on palpation.  Lungs: Clear to auscultation, no wheezing or rhonchi noted. No increased vocal fremitus resonant to percussion  Abdomen: Soft, nontender, nondistended, positive bowel sounds, no masses no hepatosplenomegaly noted..  Neuro: No focal neurological deficits noted cranial nerves II through XII grossly intact. DTRs 2+ bilaterally upper and lower extremities. Strength 5 out of 5 in bilateral upper and lower extremities. Musculoskeletal: No warm swelling or erythema around joints, no spinal tenderness noted. Psychiatric: Patient alert and oriented x3,  good insight and cognition, good recent to remote recall. Lymph node survey: No cervical axillary or inguinal lymphadenopathy noted.  Lab Results:  Basic Metabolic Panel:    Component Value Date/Time   NA 134 (L) 12/11/2018 0751   NA 139 08/17/2018 0935   K 4.0 12/11/2018 0751   CL 103 12/11/2018 0751   CO2 24 12/11/2018 0751   BUN 17 12/11/2018 0751   BUN 9 08/17/2018 0935   CREATININE 0.48 12/11/2018 0751   GLUCOSE 87 12/11/2018 0751   CALCIUM 9.3 12/11/2018 0751   CBC:    Component Value Date/Time   WBC 13.2 (H) 12/11/2018 0751   HGB 7.7 (L) 12/11/2018 0751   HGB 7.5 (L) 11/29/2018 0841   HCT 23.2 (L) 12/11/2018 0751   HCT 21.8 (L) 11/29/2018 0841   PLT 399 12/11/2018 0751   PLT 582 (H) 11/29/2018 0841   MCV 92.4 12/11/2018 0751   MCV 92 11/29/2018 0841   NEUTROABS 8.0 (H) 12/11/2018 0751   NEUTROABS 7.9 (H) 09/15/2018 0933   LYMPHSABS 2.3 12/11/2018 0751   LYMPHSABS 2.4 09/15/2018 0933   MONOABS 1.9 (H) 12/11/2018 0751   EOSABS 0.4 12/11/2018 0751   EOSABS 0.1 09/15/2018 0933   BASOSABS 0.1 12/11/2018 0751   BASOSABS 0.0 09/15/2018 0933    No results found for this or any previous visit (from the past 240 hour(s)).  Studies/Results: No results found.  Medications: Scheduled Meds: . aspirin EC  81 mg Oral Daily  . feeding supplement (ENSURE ENLIVE)  237 mL Oral  BID BM  . folic acid  1 mg Oral Daily  . HYDROmorphone   Intravenous Q4H  . morphine  30 mg Oral Q12H  . oxyCODONE  10 mg Oral Q4H while awake  . prenatal multivitamin  1 tablet Oral Daily  . senna-docusate  1 tablet Oral BID   Continuous Infusions: . sodium chloride 20 mL/hr at 12/10/18 2016   PRN Meds:.diphenhydrAMINE, naloxone **AND** sodium chloride flush, ondansetron (ZOFRAN) IV, polyethylene glycol  Consultants:  Obstetrics  Assessment/Plan: Active Problems:   Anemia of chronic disease   Supervision of high risk pregnancy, antepartum   Sickle cell pain crisis ([place])   Alpha  thalassemia silent carrier Sickle cell anemia with pain crisis: Patient is complaining of pain primarily to lower extremities.  She has been unable to ambulate in halls. Continue IV Dilaudid via PCA Oxycodone 10 mg every 4 hours while awake Maintain oxygen saturation above 90%  Anemia of chronic disease: Patient is status post 2 units of packed red blood cells.  Hemoglobin is 7.7 today.  Patient's goal is 8-9 g/dL Hypokalemia: Potassium repletion  Nausea with vomiting: Continue antiemetics and advance diet as tolerated.  Continue IV fluids  Third trimester pregnancy: Followed by obstetrics    Code Status: Full Code Family Communication: N/A Disposition Plan: Not yet ready for discharge  [place], MSN, FNP-C Patient [address] [place], [place] [address] [phone]  If 5PM-7AM, please contact night-coverage.  12/11/2018, 10:29 AM  LOS: 7 days

## 2018-12-11 NOTE — Progress Notes (Signed)
Nutrition Follow-up  INTERVENTION:   Continue Ensure Enlive po BID, each supplement provides 350 kcal and 20 grams of protein  NUTRITION DIAGNOSIS:   Increased nutrient needs related to (pregnancy) as evidenced by estimated needs.  Ongoing.  GOAL:   Patient will meet greater than or equal to 90% of their needs  Currently eating 100% of meals.  MONITOR:   PO intake, Supplement acceptance, Labs, Weight trends, I & O's  ASSESSMENT:   28 y.o. female with a history of sickle cell anemia, hemoglobin SS, chronic pain syndrome, and currently [redacted] weeks pregnant presents complaining of diffuse pain primarily to upper and lower extremities for greater than 1 week. Admitted for sickle cell pain crisis.  Patient continues to eat well, PO 100% of meals. Drinking her Ensure supplements with no issue. Mainly having sickle cell related pain. Weights are trending upward (not sure if this is fluid related).   Medications: Folic acid tablet daily, Prenatal MVI daily, Senokot-S tablet BID Labs reviewed: Low Na  Diet Order:   Diet Order            Diet regular Room service appropriate? Yes  Diet effective now              EDUCATION NEEDS:   Education needs have been addressed  Skin:  Skin Assessment: Reviewed RN Assessment  Last BM:  2/2  Height:   Ht Readings from Last 1 Encounters:  12/03/18 5\' 3"  (1.6 m)    Weight:   Wt Readings from Last 1 Encounters:  12/08/18 80.2 kg    Ideal Body Weight:  52.3 kg  BMI:  Body mass index is 31.32 kg/m.  Estimated Nutritional Needs:   Kcal:  1650-1850  Protein:  75-85g  Fluid:  2.4L/day  Clayton Bibles, MS, RD, LDN [place] Dietitian Pager: [phone] After Hours Pager: [phone]

## 2018-12-12 LAB — HEMOGLOBINOPATHY EVALUATION
Hgb A2 Quant: 3.7 % — ABNORMAL HIGH (ref 1.8–3.2)
Hgb A: 13.2 % — ABNORMAL LOW (ref 96.4–98.8)
Hgb C: 0 %
Hgb F Quant: 14.3 % — ABNORMAL HIGH (ref 0.0–2.0)
Hgb S Quant: 68.8 % — ABNORMAL HIGH
Hgb Variant: 0 %

## 2018-12-12 NOTE — Progress Notes (Signed)
Pt complains of "pain is still there in legs and arms". MD called and notified about the pain and that pt is "staying" another night. MD is ok with this.

## 2018-12-12 NOTE — Progress Notes (Signed)
Subjective: Patient states that pain has improved overnight.  Current pain intensity 7/10 primarily to lower extremities.  Patient's pain is currently not at goal. Patient is [redacted] weeks pregnant and continues to be followed by obstetrics.  Patient denies vaginal bleeding, vaginal discharge, or abdominal cramping.  Patient afebrile and maintaining oxygen saturation above 90%.  Objective:  Vital signs in last 24 hours:  Vitals:   12/12/18 0134 12/12/18 0502 12/12/18 0504 12/12/18 0836  BP: 121/77 118/83    Pulse: (!) 110 95    Resp: 18 19 15  (!) 23  Temp: 98.7 F (37.1 C) 98.9 F (37.2 C)    TempSrc: Oral Oral    SpO2: 100% 98% 97% 96%  Weight:      Height:        Intake/Output from previous day:   Intake/Output Summary (Last 24 hours) at 12/12/2018 1005 Last data filed at 12/12/2018 0500 Gross per 24 hour  Intake 1200 ml  Output -  Net 1200 ml    Physical Exam: General: Alert, awake, oriented x3, in no acute distress.  HEENT: [place]/AT PEERL, EOMI Neck: Trachea midline,  no masses, no thyromegal,y no JVD, no carotid bruit OROPHARYNX:  Moist, No exudate/ erythema/lesions.  Heart: Regular rate and rhythm, without murmurs, rubs, gallops, PMI non-displaced, no heaves or thrills on palpation.  Lungs: Clear to auscultation, no wheezing or rhonchi noted. No increased vocal fremitus resonant to percussion  Abdomen: Soft, nontender, nondistended, positive bowel sounds, no masses no hepatosplenomegaly noted..  Neuro: No focal neurological deficits noted cranial nerves II through XII grossly intact. DTRs 2+ bilaterally upper and lower extremities. Strength 5 out of 5 in bilateral upper and lower extremities. Musculoskeletal: No warm swelling or erythema around joints, no spinal tenderness noted. Psychiatric: Patient alert and oriented x3, good insight and cognition, good recent to remote recall. Lymph node survey: No cervical axillary or inguinal lymphadenopathy noted.  Lab  Results:  Basic Metabolic Panel:    Component Value Date/Time   NA 134 (L) 12/11/2018 0751   NA 139 08/17/2018 0935   K 4.0 12/11/2018 0751   CL 103 12/11/2018 0751   CO2 24 12/11/2018 0751   BUN 17 12/11/2018 0751   BUN 9 08/17/2018 0935   CREATININE 0.48 12/11/2018 0751   GLUCOSE 87 12/11/2018 0751   CALCIUM 9.3 12/11/2018 0751   CBC:    Component Value Date/Time   WBC 13.2 (H) 12/11/2018 0751   HGB 7.7 (L) 12/11/2018 0751   HGB 7.5 (L) 11/29/2018 0841   HCT 23.2 (L) 12/11/2018 0751   HCT 21.8 (L) 11/29/2018 0841   PLT 399 12/11/2018 0751   PLT 582 (H) 11/29/2018 0841   MCV 92.4 12/11/2018 0751   MCV 92 11/29/2018 0841   NEUTROABS 8.0 (H) 12/11/2018 0751   NEUTROABS 7.9 (H) 09/15/2018 0933   LYMPHSABS 2.3 12/11/2018 0751   LYMPHSABS 2.4 09/15/2018 0933   MONOABS 1.9 (H) 12/11/2018 0751   EOSABS 0.4 12/11/2018 0751   EOSABS 0.1 09/15/2018 0933   BASOSABS 0.1 12/11/2018 0751   BASOSABS 0.0 09/15/2018 0933    No results found for this or any previous visit (from the past 240 hour(s)).  Studies/Results: No results found.  Medications: Scheduled Meds: . aspirin EC  81 mg Oral Daily  . feeding supplement (ENSURE ENLIVE)  237 mL Oral BID BM  . folic acid  1 mg Oral Daily  . HYDROmorphone   Intravenous Q4H  . morphine  30 mg Oral Q12H  . oxyCODONE  10 mg Oral Q4H while awake  . prenatal multivitamin  1 tablet Oral Daily  . senna-docusate  1 tablet Oral BID   Continuous Infusions: . sodium chloride 10 mL/hr at 12/11/18 2109   PRN Meds:.diphenhydrAMINE, naloxone **AND** sodium chloride flush, ondansetron (ZOFRAN) IV, polyethylene glycol  Consultants:  Obstetrics  Assessment/Plan: Active Problems:   Anemia of chronic disease   Supervision of high risk pregnancy, antepartum   Sickle cell pain crisis ([place])   Alpha thalassemia silent carrier Sickle cell anemia with pain crisis: Patient is complaining of pain primarily to lower extremities.  Continue IV  Dilaudid via PCA Oxycodone 10 mg every 4 hours while awake Maintain oxygen saturation above 90% Encourage patient to ambulate in halls  Anemia of chronic disease: Patient is status post 2 units of packed red blood cells.  Hemoglobin stable.  Patient's goal is 8-9 g/dL. Repeat CBC in a.m.  Hypokalemia: Potassium repletion Repeat BMP in a.m.  Nausea with vomiting: Continue antiemetics and advance diet as tolerated.  Continue IV fluids  Third trimester pregnancy: Followed by obstetrics    Code Status: Full Code Family Communication: N/A Disposition Plan: Not yet ready for discharge  [place], MSN, FNP-C Patient [address] [place], [place] [address] [phone]  If 5PM-7AM, please contact night-coverage.  12/12/2018, 10:05 AM  LOS: 8 days

## 2018-12-13 ENCOUNTER — Encounter: Payer: Self-pay | Admitting: Obstetrics and Gynecology

## 2018-12-13 DIAGNOSIS — D571 Sickle-cell disease without crisis: Secondary | ICD-10-CM

## 2018-12-13 DIAGNOSIS — Z79891 Long term (current) use of opiate analgesic: Secondary | ICD-10-CM

## 2018-12-13 LAB — CBC
HCT: 23.7 % — ABNORMAL LOW (ref 36.0–46.0)
Hemoglobin: 8.2 g/dL — ABNORMAL LOW (ref 12.0–15.0)
MCH: 31.8 pg (ref 26.0–34.0)
MCHC: 34.6 g/dL (ref 30.0–36.0)
MCV: 91.9 fL (ref 80.0–100.0)
Platelets: 326 10*3/uL (ref 150–400)
RBC: 2.58 MIL/uL — ABNORMAL LOW (ref 3.87–5.11)
RDW: 15.9 % — ABNORMAL HIGH (ref 11.5–15.5)
WBC: 14.8 10*3/uL — ABNORMAL HIGH (ref 4.0–10.5)
nRBC: 5.6 % — ABNORMAL HIGH (ref 0.0–0.2)

## 2018-12-13 LAB — BASIC METABOLIC PANEL
Anion gap: 12 (ref 5–15)
BUN: 19 mg/dL (ref 6–20)
CHLORIDE: 102 mmol/L (ref 98–111)
CO2: 20 mmol/L — ABNORMAL LOW (ref 22–32)
Calcium: 9.2 mg/dL (ref 8.9–10.3)
Creatinine, Ser: 0.49 mg/dL (ref 0.44–1.00)
GFR calc Af Amer: >60 mL/min (ref >60)
GFR calc non Af Amer: >60 mL/min (ref >60)
Glucose, Bld: 98 mg/dL (ref 70–99)
Potassium: 4.6 mmol/L (ref 3.5–5.1)
Sodium: 134 mmol/L — ABNORMAL LOW (ref 135–145)

## 2018-12-13 NOTE — Progress Notes (Signed)
Discharge teaching completed with teach back. Discharge instructions given and reviewed with pt. Pt. understands to pick up medication at pharmacy and follow up appointments already scheduled.

## 2018-12-13 NOTE — Discharge Summary (Signed)
Physician Discharge Summary  Carmen [name] UUV:[id number] DOB: 1991/05/23 DOA: 12/03/2018  PCP: Lanae Boast, FNP  Admit date: 12/03/2018  Discharge date: 12/13/2018  Discharge Diagnoses:  Active Problems:   Anemia of chronic disease   Chronic prescription opiate use   Supervision of high risk pregnancy, antepartum   Sickle cell pain crisis [facility])   Discharge Condition: Stable  Disposition:  Follow-up Information    Lanae Boast, FNP Follow up.   Specialty:  Family Medicine Why:  Follow up as scheduled. Will repeat labs during that time including CBCD and CMP Contact information: [place] [address] [phone]          Pt is discharged home in good condition and is to follow up with Lanae Boast, FNP in 1 week to have labs evaluated. Carmen [name] is instructed to increase activity slowly and balance with rest for the next few days, and use prescribed medication to complete treatment of pain  Diet: Regular Wt Readings from Last 3 Encounters:  12/08/18 80.2 kg  12/01/18 77.3 kg  11/30/18 75.8 kg    History of present illness:  Carmen [name]  is a 28 y.o. female with a history of sickle cell anemia, hemoglobin SS, chronic pain syndrome, and currently [redacted] weeks pregnant presents complaining of diffuse pain primarily to upper and lower extremities for greater than 1 week.  Patient states that symptoms have been worsening over the past several days.  She has been taking oxycodone 5 mg every 6 hours and morphine 30 mg every 12 hours without sustained relief.  Patient states that pain is similar to previous sickle cell crisis.  Patient was evaluated by obstetrician last week due to ongoing anemia.  Patient was not transfused during that time.  Patient states that she has been taking prenatal vitamins and folic acid consistently and she is feeling the baby move frequently.  She denies contractions, vaginal bleeding, or vaginal discharge.  Patient denies  fever, cough, chest pains, shortness of breath or other focal deficits. Current pain intensity is 10/10 characterized as constant and throbbing.  ER course: WBCs 16.2, hemoglobin 7.4, which is below baseline of 8-9, potassium 3.4, and total bilirubin 1.3.  Patient maintaining oxygen saturation above 90% on RA.  Also, patient is currently afebrile.  Patient's pain persist despite IV Dilaudid, and IV fluids.  Patient admitted to [place] for further pain control  Hospital Course:  Carmen [name] is an opiate tolerant patient that was admitted and sickle cell pain crisis. Patient was admitted for sickle cell pain crisis and managed appropriately with IVF, IV Dilaudid via PCA  as well as other adjunct therapies per sickle cell pain management protocols.  Patient has a history of chronic pain and typically takes MS Contin and oxycodone at home.  MS Contin was continued throughout admission without interruption.  Patient is also in third trimester pregnancy.  She is around [redacted] weeks gestation.  Patient was followed by obstetrics.  Appreciated the input of obstetrics throughout admission.  Ms. Tax pregnancy seems to be progressing well.  Patient is aware of all scheduled appointments with obstetrician.  Patient is also scheduled to follow-up in primary care.  Throughout third trimester I recommend a hemoglobinopathy evaluation.  If patient's hemoglobin S is above 30%, recommend exchange transfusion.  Patient is alert, oriented, and ambulating without assistance.  She is afebrile and maintaining oxygen saturation above 90%.  Pain intensity decreased to 5/10.  Patient is able to manage pain at  home on opiate medication regimen.  Patient is aware of primary care pain management follow-up.   Patient was discharged home today in a hemodynamically stable condition.   Discharge Exam: Vitals:   12/13/18 0857 12/13/18 0939  BP:  118/76  Pulse:  94  Resp: 20 16  Temp:  98.6 F (37 C)  SpO2: 97% 97%    Vitals:   12/13/18 0155 12/13/18 0512 12/13/18 0857 12/13/18 0939  BP: 129/75 124/67  118/76  Pulse: (!) 102 95  94  Resp: 18 20 20 16   Temp: 98 F (36.7 C) 98.1 F (36.7 C)  98.6 F (37 C)  TempSrc: Oral Axillary  Oral  SpO2: 100% 98% 97% 97%  Weight:      Height:       Physical Exam Constitutional:      Appearance: Normal appearance.  HENT:     Head: Normocephalic.     Nose: Nose normal.     Mouth/Throat:     Mouth: Mucous membranes are moist.  Neck:     Musculoskeletal: Normal range of motion.  Cardiovascular:     Rate and Rhythm: Normal rate and regular rhythm.  Pulmonary:     Effort: Pulmonary effort is normal.     Breath sounds: Normal breath sounds.  Abdominal:     General: Abdomen is flat. Bowel sounds are normal.  Musculoskeletal: Normal range of motion.  Skin:    General: Skin is warm and dry.  Neurological:     General: No focal deficit present.     Mental Status: She is alert. Mental status is at baseline.  Psychiatric:        Mood and Affect: Mood normal.        Behavior: Behavior normal.        Thought Content: Thought content normal.        Judgment: Judgment normal.     Discharge Instructions  Discharge Instructions    Discharge patient   Complete by:  As directed    Discharge disposition:  01-Home or Self Care   Discharge patient date:  12/13/2018     Allergies as of 12/13/2018   No Known Allergies     Medication List    TAKE these medications   aspirin EC 81 MG tablet Take 1 tablet (81 mg total) by mouth daily.   ENSURE HEALTHY MOM Liqd Drink 1 pouch BID   ergocalciferol 1.25 MG (50000 UT) capsule Commonly known as:  VITAMIN D2 Take 1 capsule (50,000 Units total) by mouth once a week.   folic acid 1 MG tablet Commonly known as:  FOLVITE Take 1 tablet (1 mg total) by mouth daily.   morphine 30 MG 12 hr tablet Commonly known as:  MS CONTIN Take 1 tablet (30 mg total) by mouth every 8 (eight) hours as needed for up to 28  days for pain.   oxyCODONE 5 MG immediate release tablet Commonly known as:  Oxy IR/ROXICODONE Take 5 mg by mouth every 4 (four) hours as needed for moderate pain.   PREPLUS 27-1 MG Tabs Take 1 tablet by mouth daily.       The results of significant diagnostics from this hospitalization (including imaging, microbiology, ancillary and laboratory) are listed below for reference.    Significant Diagnostic Studies: [place] Mfm Ob Follow Up  Result Date: 12/02/2018 ----------------------------------------------------------------------  OBSTETRICS REPORT                       (Signed  Final 12/02/2018 04:00 pm) ---------------------------------------------------------------------- Patient Info  ID #:       [id number]                          D.O.B.:  [birth year] (27 yrs)  Name:       TELINA KLECKLEY Effertz                  Visit Date: 12/01/2018 03:47 pm ---------------------------------------------------------------------- Performed By  Performed By:     Wilnette Kales        Ref. Address:      [facility]                                                              [place]                                                              [place], [place]  Attending:        Sander Nephew      Location:          [facility]                    MD  Referred By:      [facility] for                    [place] ---------------------------------------------------------------------- Orders   #  Description                          Code  Ordered By   1  [place] MFM OB FOLLOW UP                  [id number]     RAVI [facility]  ----------------------------------------------------------------------   #  Order #                     Accession #                 Episode #   1  [id number]                  [id number]                  [id number]  ---------------------------------------------------------------------- Indications   Maternal sickle cell anemia, third trimester   O99.013, P53.6   Drug use complicating pregnancy, second        O99.322   trimester (chronic opiate use)   Encounter for other antenatal screening        Z36.2   follow-up   [redacted] weeks gestation of pregnancy                Z3A.28  ---------------------------------------------------------------------- Fetal Evaluation  Num Of Fetuses:          1  Fetal Heart Rate(bpm):   154  Cardiac Activity:        Observed  Presentation:            Cephalic  Placenta:                Posterior  Amniotic Fluid  AFI FV:      Within normal limits  AFI Sum(cm)     %Tile       Largest Pocket(cm)  20.64           83          7.65  RUQ(cm)       RLQ(cm)       LUQ(cm)        LLQ(cm)  7.65          4.66          4.26           4.07 ---------------------------------------------------------------------- Biometry  BPD:      72.2  mm     G. Age:  29w 0d         51  %    CI:        73.58   %    70 - 86                                                          FL/HC:       19.8  %    19.6 - 20.8  HC:      267.4  mm     G. Age:  29w 1d         35  %    HC/AC:       1.08       0.99 - 1.21  AC:      246.8  mm     G. Age:  28w 6d         53  %    FL/BPD:      73.3  %  71 - 87  FL:       52.9  mm     G. Age:  28w 1d         23  %    FL/AC:       21.4  %    20 - 24  Est. FW:    1266   gm   2 lb 13 oz      53  % ---------------------------------------------------------------------- OB History  Gravidity:    2  TOP:          1 ---------------------------------------------------------------------- Gestational Age  LMP:           28w 4d        Date:  05/15/18                 EDD:   02/19/19  U/S Today:     28w 6d                                        EDD:   02/17/19  Best:          28w 4d     Det. By:  LMP  (05/15/18)           EDD:   02/19/19 ---------------------------------------------------------------------- Anatomy  Cranium:               Previously seen        LVOT:                   Appears normal  Cavum:                 Previously seen        Aortic Arch:            Appears normal  Ventricles:            Appears normal         Ductal Arch:            Appears normal  Choroid Plexus:        Previously seen        Diaphragm:              Previously seen  Cerebellum:            Previously seen        Stomach:                Appears normal, left                                                                        sided  Posterior Fossa:       Previously seen        Abdomen:                Previously seen  Nuchal Fold:           Not applicable (>36    Abdominal Wall:         Previously seen  wks GA)  Face:                  Profile previously     Cord Vessels:           Previously seen                         seen  Lips:                  Appears normal         Kidneys:                Appear normal  Palate:                Previously seen        Bladder:                Appears normal  Thoracic:              Previously seen        Spine:                  Previously seen  Heart:                 Appears normal         Upper Extremities:      Previously seen                         (4CH, axis, and                         situs)  RVOT:                  Appears normal         Lower Extremities:      Previously seen  Other:  Female gender. ---------------------------------------------------------------------- Cervix Uterus Adnexa  Cervix  Normal appearance by transabdominal scan. ---------------------------------------------------------------------- Impression  Normal interval growth. ---------------------------------------------------------------------- Recommendations  Follow up growth in 4 weeks.  Initiate weekly testing at 32 weeks. ----------------------------------------------------------------------                Sander Nephew, MD Electronically Signed Final Report   12/02/2018 04:00 pm ----------------------------------------------------------------------   Microbiology: No results found for this or any previous visit (from the past 240 hour(s)).   Labs: Basic Metabolic Panel: Recent Labs  Lab 12/07/18 0945 12/08/18 0957 12/09/18 0937 12/11/18 0751 12/13/18 0320  NA 135 135 135 134* 134*  K 4.3 3.8 3.8 4.0 4.6  CL 103 104 104 103 102  CO2 23 22 24 24  20*  GLUCOSE 103* 89 101* 87 98  BUN 10 12 13 17 19   CREATININE 0.42* 0.40* 0.44 0.48 0.49  CALCIUM 9.2 9.0 9.1 9.3 9.2   Liver Function Tests: Recent Labs  Lab 12/09/18 0937 12/11/18 0751  AST 58* 75*  ALT 46* 75*  ALKPHOS 174* 202*  BILITOT 2.4* 2.2*  PROT 6.9 7.2  ALBUMIN 2.8* 2.8*   No results for input(s): LIPASE, AMYLASE in the last 168 hours. No results for input(s): AMMONIA in the last 168 hours. CBC: Recent Labs  Lab 12/08/18 0957 12/09/18 0937 12/10/18 1158 12/11/18 0751 12/13/18 0320  WBC 13.1* 14.6* 15.9* 13.2* 14.8*  NEUTROABS 8.5* 9.7*  --  8.0*  --   HGB 7.6* 7.3* 8.1* 7.7* 8.2*  HCT 21.8* 21.4* 24.5* 23.2* 23.7*  MCV 90.5 93.9  94.2 92.4 91.9  PLT 412* 402* 388 399 326   Cardiac Enzymes: No results for input(s): CKTOTAL, CKMB, CKMBINDEX, TROPONINI in the last 168 hours. BNP: Invalid input(s): POCBNP CBG: No results for input(s): GLUCAP in the last 168 hours.  Time coordinating discharge: 40 minutes  Signed:  [name]  APRN, MSN, FNP-C Patient [facility] [place], [place] [address] [phone]  [facility] 12/13/2018, 10:42 AM

## 2018-12-13 NOTE — Discharge Instructions (Signed)
Sickle Cell Anemia, Adult °Sickle cell anemia is a condition where your red blood cells are shaped like sickles. Red blood cells carry oxygen through the body. Sickle-shaped cells do not live as long as normal red blood cells. They also clump together and block blood from flowing through the blood vessels. This prevents the body from getting enough oxygen. Sickle cell anemia causes organ damage and pain. It also increases the risk of infection. °Follow these instructions at home: °Medicines °· Take over-the-counter and prescription medicines only as told by your doctor. °· If you were prescribed an antibiotic medicine, take it as told by your doctor. Do not stop taking the antibiotic even if you start to feel better. °· If you develop a fever, do not take medicines to lower the fever right away. Tell your doctor about the fever. °Managing pain, stiffness, and swelling °· Try these methods to help with pain: °? Use a heating pad. °? Take a warm bath. °? Distract yourself, such as by watching TV. °Eating and drinking °· Drink enough fluid to keep your pee (urine) clear or pale yellow. Drink more in hot weather and during exercise. °· Limit or avoid alcohol. °· Eat a healthy diet. Eat plenty of fruits, vegetables, whole grains, and lean protein. °· Take vitamins and supplements as told by your doctor. °Traveling °· When traveling, keep these with you: °? Your medical information. °? The names of your doctors. °? Your medicines. °· If you need to take an airplane, talk to your doctor first. °Activity °· Rest often. °· Avoid exercises that make your heart beat much faster, such as jogging. °General instructions °· Do not use products that have nicotine or tobacco, such as cigarettes and e-cigarettes. If you need help quitting, ask your doctor. °· Consider wearing a medical alert bracelet. °· Avoid being in high places (high altitudes), such as mountains. °· Avoid very hot or cold temperatures. °· Avoid places where the  temperature changes a lot. °· Keep all follow-up visits as told by your doctor. This is important. °Contact a doctor if: °· A joint hurts. °· Your feet or hands hurt or swell. °· You feel tired (fatigued). °Get help right away if: °· You have symptoms of infection. These include: °? Fever. °? Chills. °? Being very tired. °? Irritability. °? Poor eating. °? Throwing up (vomiting). °· You feel dizzy or faint. °· You have new stomach pain, especially on the left side. °· You have a an erection (priapism) that lasts more than 4 hours. °· You have numbness in your arms or legs. °· You have a hard time moving your arms or legs. °· You have trouble talking. °· You have pain that does not go away when you take medicine. °· You are short of breath. °· You are breathing fast. °· You have a long-term cough. °· You have pain in your chest. °· You have a bad headache. °· You have a stiff neck. °· Your stomach looks bloated even though you did not eat much. °· Your skin is pale. °· You suddenly cannot see well. °Summary °· Sickle cell anemia is a condition where your red blood cells are shaped like sickles. °· Follow your doctor's advice on ways to manage pain, food to eat, activities to do, and steps to take for safe travel. °· Get medical help right away if you have any signs of infection, such as a fever. °This information is not intended to replace advice given to you by your   health care provider. Make sure you discuss any questions you have with your health care provider. °Document Released: 08/08/2013 Document Revised: 11/23/2016 Document Reviewed: 11/23/2016 °[employer] Interactive Patient Education © 2019 [employer] ° °

## 2018-12-13 NOTE — Final Progress Note (Signed)
Prenatal vitamins returned to pt. from pharmacy. Pt. discharged to home, left via ambulation. No respiratory distress noted.

## 2018-12-26 ENCOUNTER — Telehealth: Payer: Self-pay

## 2018-12-26 DIAGNOSIS — D571 Sickle-cell disease without crisis: Secondary | ICD-10-CM

## 2018-12-27 ENCOUNTER — Ambulatory Visit (INDEPENDENT_AMBULATORY_CARE_PROVIDER_SITE_OTHER): Payer: Medicaid Other | Admitting: Obstetrics and Gynecology

## 2018-12-27 ENCOUNTER — Encounter: Payer: Self-pay | Admitting: Obstetrics and Gynecology

## 2018-12-27 VITALS — BP 120/82 | HR 99 | Wt 167.0 lb

## 2018-12-27 DIAGNOSIS — O0993 Supervision of high risk pregnancy, unspecified, third trimester: Secondary | ICD-10-CM

## 2018-12-27 DIAGNOSIS — E876 Hypokalemia: Secondary | ICD-10-CM

## 2018-12-27 DIAGNOSIS — D571 Sickle-cell disease without crisis: Secondary | ICD-10-CM

## 2018-12-27 DIAGNOSIS — O98113 Syphilis complicating pregnancy, third trimester: Secondary | ICD-10-CM

## 2018-12-27 DIAGNOSIS — O0990 Supervision of high risk pregnancy, unspecified, unspecified trimester: Secondary | ICD-10-CM

## 2018-12-27 DIAGNOSIS — D473 Essential (hemorrhagic) thrombocythemia: Secondary | ICD-10-CM

## 2018-12-27 DIAGNOSIS — Z3A32 32 weeks gestation of pregnancy: Secondary | ICD-10-CM

## 2018-12-27 DIAGNOSIS — O99019 Anemia complicating pregnancy, unspecified trimester: Principal | ICD-10-CM

## 2018-12-27 DIAGNOSIS — O99013 Anemia complicating pregnancy, third trimester: Secondary | ICD-10-CM

## 2018-12-27 DIAGNOSIS — D75839 Thrombocytosis, unspecified: Secondary | ICD-10-CM

## 2018-12-27 NOTE — Progress Notes (Signed)
Prenatal Visit Note Date: 12/27/2018 Clinic: [facility]  Subjective:  Carmen [name] is a 28 y.o. G2P0010 at [redacted]w[redacted]d being seen today for ongoing prenatal care.  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); and Alpha thalassemia silent carrier on their problem list.  Patient reports no complaints.   Contractions: Irritability. Vag. Bleeding: None.  Movement: Present. Denies leaking of fluid.   The following portions of the patient's history were reviewed and updated as appropriate: allergies, current medications, past family history, past medical history, past social history, past surgical history and problem list. Problem list updated.  Objective:   Vitals:   12/27/18 0944  BP: 120/82  Pulse: 99  Weight: 167 lb (75.8 kg)    Fetal Status: Fetal Heart Rate (bpm): 141   Movement: Present     General:  Alert, oriented and cooperative. Patient is in no acute distress.  Skin: Skin is warm and dry. No rash noted.   Cardiovascular: Normal heart rate noted  Respiratory: Normal respiratory effort, no problems with respiration noted  Abdomen: Soft, gravid, appropriate for gestational age. Pain/Pressure: Present     Pelvic:  Cervical exam deferred        Extremities: Normal range of motion.  Edema: None  Mental Status: Normal mood and affect. Normal behavior. Normal judgment and thought content.   Urinalysis:      Assessment and Plan:  Pregnancy: G2P0010 at [redacted]w[redacted]d  1. Thrombocytosis ([place]) resolved  2.  Hypokalemia resolved  3. Sickle cell anemia of mother during pregnancy ([place]) Stable. Has rpt growth and bpp on Friday. Start qwk testing then  4. 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 Supervision of high risk pregnancy, antepartum Routine care. Strongly considering btl vs larc. Papers signed today  Preterm labor symptoms and general obstetric precautions including but not limited to vaginal bleeding, contractions, leaking of fluid and fetal movement were reviewed in detail with the patient. Please refer to After Visit Summary for other counseling recommendations.  Return in about 10 days (around 01/06/2019) for 9-10d nst/bpp. 2wk hrob, nst/bpp.   Aletha Halim, MD

## 2018-12-29 ENCOUNTER — Ambulatory Visit (HOSPITAL_COMMUNITY): Payer: Medicaid Other

## 2018-12-29 MED ORDER — OXYCODONE HCL 5 MG PO TABS: 5.0000 mg | ORAL_TABLET | Freq: Four times a day (QID) | ORAL | 0 refills | Status: DC | PRN

## 2018-12-29 NOTE — Telephone Encounter (Signed)
Refilled

## 2019-01-01 ENCOUNTER — Other Ambulatory Visit (HOSPITAL_COMMUNITY): Payer: Self-pay | Admitting: *Deleted

## 2019-01-01 ENCOUNTER — Other Ambulatory Visit: Payer: Self-pay

## 2019-01-01 ENCOUNTER — Ambulatory Visit (HOSPITAL_COMMUNITY): Payer: Medicaid Other | Admitting: *Deleted

## 2019-01-01 ENCOUNTER — Encounter (HOSPITAL_COMMUNITY): Payer: Self-pay | Admitting: *Deleted

## 2019-01-01 ENCOUNTER — Ambulatory Visit (HOSPITAL_COMMUNITY)
Admission: RE | Admit: 2019-01-01 | Discharge: 2019-01-01 | Disposition: A | Discharge status: Home / Self Care | Payer: Medicaid Other | Source: Ambulatory Visit | Attending: Obstetrics and Gynecology | Admitting: Obstetrics and Gynecology

## 2019-01-01 DIAGNOSIS — O0990 Supervision of high risk pregnancy, unspecified, unspecified trimester: Secondary | ICD-10-CM

## 2019-01-01 DIAGNOSIS — Z3A33 33 weeks gestation of pregnancy: Secondary | ICD-10-CM

## 2019-01-01 DIAGNOSIS — O99323 Drug use complicating pregnancy, third trimester: Secondary | ICD-10-CM | POA: Diagnosis not present

## 2019-01-01 DIAGNOSIS — Z362 Encounter for other antenatal screening follow-up: Secondary | ICD-10-CM | POA: Diagnosis not present

## 2019-01-01 DIAGNOSIS — D571 Sickle-cell disease without crisis: Secondary | ICD-10-CM | POA: Diagnosis not present

## 2019-01-01 DIAGNOSIS — O99019 Anemia complicating pregnancy, unspecified trimester: Principal | ICD-10-CM

## 2019-01-01 DIAGNOSIS — O99013 Anemia complicating pregnancy, third trimester: Secondary | ICD-10-CM | POA: Insufficient documentation

## 2019-01-02 ENCOUNTER — Telehealth: Payer: Self-pay

## 2019-01-02 DIAGNOSIS — D571 Sickle-cell disease without crisis: Secondary | ICD-10-CM

## 2019-01-02 DIAGNOSIS — Z79891 Long term (current) use of opiate analgesic: Secondary | ICD-10-CM

## 2019-01-03 ENCOUNTER — Encounter: Payer: Self-pay | Admitting: *Deleted

## 2019-01-04 ENCOUNTER — Other Ambulatory Visit: Payer: Self-pay

## 2019-01-04 ENCOUNTER — Telehealth: Payer: Self-pay | Admitting: Obstetrics & Gynecology

## 2019-01-04 MED ORDER — MORPHINE SULFATE ER 30 MG PO TBCR: 30.0000 mg | EXTENDED_RELEASE_TABLET | Freq: Two times a day (BID) | ORAL | 0 refills | Status: DC

## 2019-01-04 NOTE — Telephone Encounter (Signed)
Refilled script for MS contin 30 mg q 12 h.

## 2019-01-04 NOTE — Telephone Encounter (Signed)
The patient called in to reschedule the appointment. Informed the appointment was cancelled and educated of upcoming appointments.

## 2019-01-05 ENCOUNTER — Telehealth: Payer: Self-pay

## 2019-01-05 DIAGNOSIS — Z79891 Long term (current) use of opiate analgesic: Secondary | ICD-10-CM

## 2019-01-05 DIAGNOSIS — D571 Sickle-cell disease without crisis: Secondary | ICD-10-CM

## 2019-01-05 MED ORDER — MORPHINE SULFATE ER 30 MG PO TBCR: 30.0000 mg | EXTENDED_RELEASE_TABLET | Freq: Two times a day (BID) | ORAL | 0 refills | Status: DC

## 2019-01-05 NOTE — Telephone Encounter (Signed)
Refilled. Wrong date on previous script.

## 2019-01-06 ENCOUNTER — Emergency Department (HOSPITAL_COMMUNITY)
Admission: EM | Admit: 2019-01-06 | Discharge: 2019-01-06 | Disposition: A | Discharge status: Home / Self Care | Payer: Medicaid Other | Attending: Emergency Medicine | Admitting: Emergency Medicine

## 2019-01-06 ENCOUNTER — Other Ambulatory Visit: Payer: Self-pay

## 2019-01-06 ENCOUNTER — Encounter (HOSPITAL_COMMUNITY): Payer: Self-pay | Admitting: Emergency Medicine

## 2019-01-06 DIAGNOSIS — Z79899 Other long term (current) drug therapy: Secondary | ICD-10-CM | POA: Diagnosis not present

## 2019-01-06 DIAGNOSIS — Z7982 Long term (current) use of aspirin: Secondary | ICD-10-CM | POA: Diagnosis not present

## 2019-01-06 DIAGNOSIS — D5700 Hb-SS disease with crisis, unspecified: Secondary | ICD-10-CM

## 2019-01-06 DIAGNOSIS — D57219 Sickle-cell/Hb-C disease with crisis, unspecified: Secondary | ICD-10-CM | POA: Diagnosis not present

## 2019-01-06 LAB — COMPREHENSIVE METABOLIC PANEL
ALT: 38 U/L (ref 0–44)
AST: 45 U/L — ABNORMAL HIGH (ref 15–41)
Albumin: 2.4 g/dL — ABNORMAL LOW (ref 3.5–5.0)
Alkaline Phosphatase: 374 U/L — ABNORMAL HIGH (ref 38–126)
Anion gap: 8 (ref 5–15)
BUN: 7 mg/dL (ref 6–20)
CO2: 20 mmol/L — ABNORMAL LOW (ref 22–32)
Calcium: 9.1 mg/dL (ref 8.9–10.3)
Chloride: 110 mmol/L (ref 98–111)
Creatinine, Ser: 0.71 mg/dL (ref 0.44–1.00)
GFR calc Af Amer: >60 mL/min (ref >60)
Glucose, Bld: 97 mg/dL (ref 70–99)
Potassium: 3.7 mmol/L (ref 3.5–5.1)
Sodium: 138 mmol/L (ref 135–145)
Total Bilirubin: 1.5 mg/dL — ABNORMAL HIGH (ref 0.3–1.2)
Total Protein: 7 g/dL (ref 6.5–8.1)

## 2019-01-06 LAB — CBC WITH DIFFERENTIAL/PLATELET
Abs Immature Granulocytes: 0.13 10*3/uL — ABNORMAL HIGH (ref 0.00–0.07)
Basophils Absolute: 0.1 10*3/uL (ref 0.0–0.1)
Basophils Relative: 1 %
Eosinophils Absolute: 0.1 10*3/uL (ref 0.0–0.5)
Eosinophils Relative: 1 %
HCT: 23.9 % — ABNORMAL LOW (ref 36.0–46.0)
Hemoglobin: 8.1 g/dL — ABNORMAL LOW (ref 12.0–15.0)
Immature Granulocytes: 1 %
Lymphocytes Relative: 19 %
Lymphs Abs: 2.2 10*3/uL (ref 0.7–4.0)
MCH: 32.3 pg (ref 26.0–34.0)
MCHC: 33.9 g/dL (ref 30.0–36.0)
MCV: 95.2 fL (ref 80.0–100.0)
MONO ABS: 1.1 10*3/uL — AB (ref 0.1–1.0)
MONOS PCT: 10 %
Neutro Abs: 8.1 10*3/uL — ABNORMAL HIGH (ref 1.7–7.7)
Neutrophils Relative %: 68 %
Platelets: 318 10*3/uL (ref 150–400)
RBC: 2.51 MIL/uL — ABNORMAL LOW (ref 3.87–5.11)
RDW: 17.1 % — ABNORMAL HIGH (ref 11.5–15.5)
WBC: 11.7 10*3/uL — ABNORMAL HIGH (ref 4.0–10.5)
nRBC: 30.9 % — ABNORMAL HIGH (ref 0.0–0.2)

## 2019-01-06 LAB — URINALYSIS, ROUTINE W REFLEX MICROSCOPIC
Bilirubin Urine: NEGATIVE
Glucose, UA: NEGATIVE mg/dL
Hgb urine dipstick: NEGATIVE
Ketones, ur: NEGATIVE mg/dL
Leukocytes,Ua: NEGATIVE
NITRITE: NEGATIVE
Protein, ur: NEGATIVE mg/dL
Specific Gravity, Urine: 1.006 (ref 1.005–1.030)
pH: 7 (ref 5.0–8.0)

## 2019-01-06 LAB — RETICULOCYTES
Immature Retic Fract: 47.3 % — ABNORMAL HIGH (ref 2.3–15.9)
RBC.: 2.51 MIL/uL — AB (ref 3.87–5.11)
RETIC COUNT ABSOLUTE: 340.4 10*3/uL — AB (ref 19.0–186.0)
Retic Ct Pct: 13.6 % — ABNORMAL HIGH (ref 0.4–3.1)

## 2019-01-06 MED ORDER — HYDROMORPHONE HCL 1 MG/ML IJ SOLN: 2.0000 mg | INTRAMUSCULAR | Status: AC

## 2019-01-06 MED ORDER — HYDROMORPHONE HCL 1 MG/ML IJ SOLN
2.0000 mg | INTRAMUSCULAR | Status: AC
  Administered 2019-01-06: 2 mg via INTRAVENOUS
  Filled 2019-01-06: qty 2

## 2019-01-06 MED ORDER — HYDROMORPHONE HCL 1 MG/ML IJ SOLN
2.0000 mg | Freq: Once | INTRAMUSCULAR | Status: AC
  Administered 2019-01-06: 2 mg via INTRAVENOUS
  Filled 2019-01-06: qty 2

## 2019-01-06 MED ORDER — DIPHENHYDRAMINE HCL 50 MG/ML IJ SOLN
25.0000 mg | Freq: Once | INTRAMUSCULAR | Status: AC
  Administered 2019-01-06: 25 mg via INTRAVENOUS
  Filled 2019-01-06: qty 1

## 2019-01-06 MED ORDER — ONDANSETRON HCL 4 MG/2ML IJ SOLN: 4.0000 mg | INTRAMUSCULAR | Status: DC | PRN

## 2019-01-06 MED ORDER — SODIUM CHLORIDE 0.45 % IV SOLN
INTRAVENOUS | Status: DC
  Administered 2019-01-06: 13:00:00 via INTRAVENOUS

## 2019-01-06 NOTE — ED Notes (Signed)
Paged OB rapid Per RN Gerald Stabs. Spoke with RN Stanton Kidney, Will be coming to evaluate

## 2019-01-06 NOTE — ED Notes (Signed)
The pt is complainiing that she did not get her 3rd dose of dilaudid yet  She was told by her offoging nurse that she would get a third dose when that did not happen she became upset

## 2019-01-06 NOTE — Discharge Instructions (Signed)
It was our pleasure to provide your ER care today - we hope that you feel better.  Drink plenty of fluids.  Follow up with your ob/gyn doctor this coming Monday.  Return to ER if worse, new symptoms, fevers, increased trouble breathing, other concern.   You were given pain medication in the ER - no driving for the next 8 hours, or any time when taking opiate type pain medication.

## 2019-01-06 NOTE — Progress Notes (Signed)
Pt is a G2P0 at 33 5/[redacted] weeks gestation here with c/o pain in her arms and legs. No vaginal bleeding or leaking of fluid.Says she has been feeling the baby move.

## 2019-01-06 NOTE — ED Triage Notes (Signed)
Per pt,  She is from home and complains of sickle cell pain crisis gerneralized pain 10/10.  Pt is also [redacted] weeks pregnant.  NAD noted at this time AOx4.

## 2019-01-06 NOTE — ED Notes (Signed)
The pt reports that she did not remember me coming in telling her I had just came on duty.  However her boyfriend did remember me telling her  When I came on

## 2019-01-06 NOTE — ED Provider Notes (Signed)
Signed out by Dr Tomi Bamberger that patients workup is complete, and that he had already discussed/consulted with ob/gyn and that tentative plan was to d/c to home after an additional dose of pain medication.   On recheck, pt is awake and alert. No resp depression. Appears comfortable. Vital signs normal. Afebrile.   Patient currently appears stable for d/c.   Rec close ob/gyn f/u in coming week.  Return precautions provided.      Lajean Saver, MD 01/06/19 1750

## 2019-01-06 NOTE — Progress Notes (Signed)
Spoke with Dr. Kennon Rounds. Pt is a G2P1 at 47 5/[redacted] weeks gestation here with sickle cell crisis. Pt is c/o pain in her arms and legs. No vaginal bleeding or leaking of fluid. FHR 145 BPM, min variability, no accels, no decels. Pt had been medicated with Dilaudid 2mg  and Benedryl 25mg  IV before EFM was applied. Pt is OB cleared.ED staff notified.

## 2019-01-06 NOTE — ED Provider Notes (Signed)
[facility] EMERGENCY DEPARTMENT Provider Note   CSN: [id number] Arrival date & time: 01/06/19  1237    History   Chief Complaint Chief Complaint  Patient presents with  . Sickle Cell Pain Crisis    HPI Carmen [name] is a 28 y.o. female.  HPI Patient presents emergency room for evaluation of sickle cell pain crisis.  Patient states she is having diffuse body aches.  Symptoms started a few days ago.  She has been trying to manage with her home medications but it is not effective.  Patient does not have her long-acting MS Contin.  She called her doctor and there is an issue with a refill.  The medication will be available till Monday.  Patient denies any fevers or chills.  No vomiting or diarrhea.  Patient is currently [redacted] weeks pregnant.  She has been having some abdominal discomfort but does not feel like these are contractions.  She has not noticed any vaginal bleeding or discharge. Past Medical History:  Diagnosis Date  . Headache(784.0)   . Sickle cell crisis ([place])   . Syphilis 2015   Was diagnosed and received one injection of antibiotics  . Thrombocytosis ([place]) 11/22/2014     CBC Latest Ref Rng & Units 12/13/2018 12/11/2018 12/10/2018 WBC 4.0 - 10.5 K/uL 14.8(H) 13.2(H) 15.9(H) Hemoglobin 12.0 - 15.0 g/dL 8.2(L) 7.7(L) 8.1(L) Hematocrit 36.0 - 46.0 % 23.7(L) 23.2(L) 24.5(L) Platelets 150 - 400 K/uL 326 399 388      Patient Active Problem List   Diagnosis Date Noted  . Sickle cell pain crisis ([place]) 12/04/2018  . Alpha thalassemia silent carrier 12/04/2018  . 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 Pain in extremity   . Supervision of high risk pregnancy, antepartum 08/17/2018  . Chronic prescription opiate use 03/13/2018  . Chronic musculoskeletal pain 03/13/2018  . Vitamin D deficiency 01/13/2018  . 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 Anemia of chronic disease   . Systolic  ejection murmur 09/11/2014  . 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 CHOLECYSTECTOMY N/A 11/30/2014   Procedure: LAPAROSCOPIC CHOLECYSTECTOMY SINGLE SITE WITH INTRAOPERATIVE CHOLANGIOGRAM;  Surgeon: Michael Boston, MD;  Location: [facility] ORS;  Service: General;  Laterality: N/A;  . SPLENECTOMY       OB History    Gravida  2   Para      Term      Preterm      AB  1   Living  0     SAB      TAB  1   Ectopic      Multiple      Live Births               Home Medications    Prior to Admission medications   Medication Sig Start Date End Date Taking? Authorizing Provider  aspirin EC 81 MG tablet Take 1 tablet (81 mg total) by mouth daily. 08/17/18   [name], MD  ergocalciferol (VITAMIN D2) 50000 units capsule Take 1 capsule (50,000 Units total) by mouth once a week. 05/22/18   Tresa Garter, MD  folic acid (FOLVITE) 1 MG tablet Take 1 tablet (1 mg total) by mouth daily. 08/17/18   [name], MD  morphine (MS CONTIN) 30 MG 12 hr tablet Take 1 tablet (30 mg total)  by mouth every 12 (twelve) hours for 28 days. 01/05/19 02/02/19  Lanae Boast, FNP  Nutritional Supplements (ENSURE HEALTHY MOM) LIQD Drink 1 pouch BID 10/18/18   Dove, Myra C, MD  oxyCODONE (OXY IR/ROXICODONE) 5 MG immediate release tablet Take 1 tablet (5 mg total) by mouth every 6 (six) hours as needed for up to 15 days for moderate pain. 12/29/18 01/13/19  Lanae Boast, FNP  Prenatal Vit-Fe Fumarate-FA (PREPLUS) 27-1 MG TABS Take 1 tablet by mouth daily. 08/17/18   [name], MD    Family History Family History  Problem Relation Age of Onset  . Hypertension Mother   . Sickle cell anemia Sister   . Kidney disease Sister        Lupus  . Arthritis Sister   . Sickle cell anemia Sister   . Sickle cell trait Sister   . Heart disease Maternal Aunt        CABG  . Heart disease Maternal Uncle        CABG    . Lupus Sister     Social History Social History   Tobacco Use  . Smoking status: Never Smoker  . Smokeless tobacco: Never Used  Substance Use Topics  . Alcohol use: No  . Drug use: No     Allergies   Patient has no known allergies.   Review of Systems Review of Systems  All other systems reviewed and are negative.    Physical Exam Updated Vital Signs BP 112/64   Pulse 78   Temp 98 F (36.7 C) (Oral)   Resp 18   LMP 05/15/2018 (Approximate)   SpO2 98%   Physical Exam Vitals signs and nursing note reviewed.  Constitutional:      General: She is not in acute distress.    Appearance: She is well-developed.  HENT:     Head: Normocephalic and atraumatic.     Right Ear: External ear normal.     Left Ear: External ear normal.  Eyes:     General: No scleral icterus.       Right eye: No discharge.        Left eye: No discharge.     Conjunctiva/sclera: Conjunctivae normal.  Neck:     Musculoskeletal: Neck supple.     Trachea: No tracheal deviation.  Cardiovascular:     Rate and Rhythm: Normal rate and regular rhythm.  Pulmonary:     Effort: Pulmonary effort is normal. No respiratory distress.     Breath sounds: Normal breath sounds. No stridor. No wheezing or rales.  Abdominal:     General: Bowel sounds are normal. There is no distension.     Palpations: Abdomen is soft.     Tenderness: There is no abdominal tenderness. There is no guarding or rebound.     Comments: Gravid uterus  Musculoskeletal:        General: No tenderness.  Skin:    General: Skin is warm and dry.     Findings: No rash.  Neurological:     Mental Status: She is alert.     Cranial Nerves: No cranial nerve deficit (no facial droop, extraocular movements intact, no slurred speech).     Sensory: No sensory deficit.     Motor: No abnormal muscle tone or seizure activity.     Coordination: Coordination normal.      ED Treatments / Results  Labs (all labs ordered are listed, but only  abnormal results are displayed) Labs Reviewed  CBC  WITH DIFFERENTIAL/PLATELET - Abnormal; Notable for the following components:      Result Value   WBC 11.7 (*)    RBC 2.51 (*)    Hemoglobin 8.1 (*)    HCT 23.9 (*)    RDW 17.1 (*)    nRBC 30.9 (*)    Neutro Abs 8.1 (*)    Monocytes Absolute 1.1 (*)    Abs Immature Granulocytes 0.13 (*)    All other components within normal limits  RETICULOCYTES - Abnormal; Notable for the following components:   Retic Ct Pct 13.6 (*)    RBC. 2.51 (*)    Retic Count, Absolute 340.4 (*)    Immature Retic Fract 47.3 (*)    All other components within normal limits  COMPREHENSIVE METABOLIC PANEL - Abnormal; Notable for the following components:   CO2 20 (*)    Albumin 2.4 (*)    AST 45 (*)    Alkaline Phosphatase 374 (*)    Total Bilirubin 1.5 (*)    All other components within normal limits  URINALYSIS, ROUTINE W REFLEX MICROSCOPIC    EKG None  Radiology No results found.  Procedures Procedures (including critical care time)  Medications Ordered in ED Medications  0.45 % sodium chloride infusion ( Intravenous New Bag/Given 01/06/19 1318)  HYDROmorphone (DILAUDID) injection 2 mg (has no administration in time range)    Or  HYDROmorphone (DILAUDID) injection 2 mg (has no administration in time range)  ondansetron (ZOFRAN) injection 4 mg (has no administration in time range)  HYDROmorphone (DILAUDID) injection 2 mg (2 mg Intravenous Given 01/06/19 1319)    Or  HYDROmorphone (DILAUDID) injection 2 mg ( Subcutaneous See Alternative 01/06/19 1319)  HYDROmorphone (DILAUDID) injection 2 mg (2 mg Intravenous Given 01/06/19 1422)    Or  HYDROmorphone (DILAUDID) injection 2 mg ( Subcutaneous See Alternative 01/06/19 1422)  diphenhydrAMINE (BENADRYL) injection 25 mg (25 mg Intravenous Given 01/06/19 1318)     Initial Impression / Assessment and Plan / ED Course  I have reviewed the triage vital signs and the nursing notes.  Pertinent labs & imaging  results that were available during my care of the patient were reviewed by me and considered in my medical decision making (see chart for details).  Clinical Course as of Jan 05 1625  Sat Jan 06, 2019  1251 Discussed with Dr Kennon Rounds.  OK to treat in the ED for now for probable sickle cell crisis.  If not improving and requrring admission, I will call her back.   [JK]  1411 Labs reviewed.  Hemoglobin is stable.   [JK]  1412 Bilirubin is chronically elevated.   [OZ]  3086 Rn Early, OB team evaluated pt.  Pt is cleared from an OB standpoint.   [VH]  8469 Patient is still having pain.  We will proceed with additional doses pain meds per algorithm   [JK]  1625 She would like to try her final medication determine if she needs to be admitted or can go home.   [JK]    Clinical Course User Index [JK] Dorie Rank, MD     Pt G1P0 at 66 wks  presents with sickle cell pain crisis.  No signs of acute obstetrical complications.  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  Pt would like to try and go home.  Will continue with final dose of pain meds.  Determine need for inpatient treatment at that time.  Care turned over to Dr Ashok Cordia  Final Clinical Impressions(s) / ED  Diagnoses   Final diagnoses:  Sickle cell pain crisis [facility])       Dorie Rank, MD 01/06/19 1626

## 2019-01-07 ENCOUNTER — Encounter (HOSPITAL_COMMUNITY): Payer: Self-pay | Admitting: Emergency Medicine

## 2019-01-07 ENCOUNTER — Emergency Department (HOSPITAL_COMMUNITY)
Admission: EM | Admit: 2019-01-07 | Discharge: 2019-01-07 | Disposition: A | Discharge status: Home / Self Care | Payer: Medicaid Other | Attending: Emergency Medicine | Admitting: Emergency Medicine

## 2019-01-07 ENCOUNTER — Other Ambulatory Visit: Payer: Self-pay

## 2019-01-07 DIAGNOSIS — D5700 Hb-SS disease with crisis, unspecified: Secondary | ICD-10-CM | POA: Diagnosis not present

## 2019-01-07 DIAGNOSIS — O9989 Other specified diseases and conditions complicating pregnancy, childbirth and the puerperium: Secondary | ICD-10-CM | POA: Diagnosis present

## 2019-01-07 DIAGNOSIS — Z79899 Other long term (current) drug therapy: Secondary | ICD-10-CM | POA: Insufficient documentation

## 2019-01-07 DIAGNOSIS — Z7982 Long term (current) use of aspirin: Secondary | ICD-10-CM | POA: Diagnosis not present

## 2019-01-07 DIAGNOSIS — Z3A33 33 weeks gestation of pregnancy: Secondary | ICD-10-CM | POA: Diagnosis not present

## 2019-01-07 LAB — CBC WITH DIFFERENTIAL/PLATELET
ABS IMMATURE GRANULOCYTES: 0.27 10*3/uL — AB (ref 0.00–0.07)
Basophils Absolute: 0.1 10*3/uL (ref 0.0–0.1)
Basophils Relative: 0 %
Eosinophils Absolute: 0.1 10*3/uL (ref 0.0–0.5)
Eosinophils Relative: 1 %
HCT: 27.8 % — ABNORMAL LOW (ref 36.0–46.0)
Hemoglobin: 8.9 g/dL — ABNORMAL LOW (ref 12.0–15.0)
Immature Granulocytes: 2 %
Lymphocytes Relative: 22 %
Lymphs Abs: 2.9 10*3/uL (ref 0.7–4.0)
MCH: 31.9 pg (ref 26.0–34.0)
MCHC: 32 g/dL (ref 30.0–36.0)
MCV: 99.6 fL (ref 80.0–100.0)
Monocytes Absolute: 1.3 10*3/uL — ABNORMAL HIGH (ref 0.1–1.0)
Monocytes Relative: 10 %
Neutro Abs: 8.8 10*3/uL — ABNORMAL HIGH (ref 1.7–7.7)
Neutrophils Relative %: 65 %
Platelets: 336 10*3/uL (ref 150–400)
RBC: 2.79 MIL/uL — ABNORMAL LOW (ref 3.87–5.11)
RDW: 16.9 % — AB (ref 11.5–15.5)
WBC: 13.5 10*3/uL — ABNORMAL HIGH (ref 4.0–10.5)
nRBC: 23.6 % — ABNORMAL HIGH (ref 0.0–0.2)

## 2019-01-07 LAB — BASIC METABOLIC PANEL
Anion gap: 6 (ref 5–15)
BUN: 7 mg/dL (ref 6–20)
CHLORIDE: 109 mmol/L (ref 98–111)
CO2: 21 mmol/L — AB (ref 22–32)
Calcium: 8.9 mg/dL (ref 8.9–10.3)
Creatinine, Ser: 0.62 mg/dL (ref 0.44–1.00)
GFR calc Af Amer: >60 mL/min (ref >60)
GFR calc non Af Amer: >60 mL/min (ref >60)
Glucose, Bld: 80 mg/dL (ref 70–99)
Potassium: 3.8 mmol/L (ref 3.5–5.1)
Sodium: 136 mmol/L (ref 135–145)

## 2019-01-07 LAB — RETICULOCYTES
Immature Retic Fract: 53.1 % — ABNORMAL HIGH (ref 2.3–15.9)
RBC.: 2.79 MIL/uL — ABNORMAL LOW (ref 3.87–5.11)
Retic Count, Absolute: 342.6 10*3/uL — ABNORMAL HIGH (ref 19.0–186.0)
Retic Ct Pct: 12.3 % — ABNORMAL HIGH (ref 0.4–3.1)

## 2019-01-07 MED ORDER — HYDROMORPHONE HCL 2 MG/ML IJ SOLN: 2.0000 mg | INTRAMUSCULAR | Status: DC

## 2019-01-07 MED ORDER — HYDROMORPHONE HCL 2 MG/ML IJ SOLN: 2.0000 mg | INTRAMUSCULAR | Status: AC

## 2019-01-07 MED ORDER — SODIUM CHLORIDE 0.45 % IV SOLN
INTRAVENOUS | Status: DC
  Administered 2019-01-07: 20:00:00 via INTRAVENOUS

## 2019-01-07 MED ORDER — HYDROMORPHONE HCL 2 MG/ML IJ SOLN
2.0000 mg | INTRAMUSCULAR | Status: AC
  Administered 2019-01-07: 2 mg via INTRAVENOUS
  Filled 2019-01-07: qty 1

## 2019-01-07 MED ORDER — ONDANSETRON HCL 4 MG/2ML IJ SOLN: 4.0000 mg | INTRAMUSCULAR | Status: DC | PRN

## 2019-01-07 NOTE — Discharge Instructions (Signed)
See your sickle cell, and OB doctors, as needed, and as planned.

## 2019-01-07 NOTE — ED Triage Notes (Signed)
Pt reports that she is [redacted] weeks pregnant and having pains in arms and legs for couple days. Reports was seen at [facility] ED yesterday for same symptoms.

## 2019-01-07 NOTE — ED Provider Notes (Signed)
[facility] DEPT Provider Note   CSN: [id number] Arrival date & time: 01/07/19  1810    History   Chief Complaint Chief Complaint  Patient presents with  . Sickle Cell Pain Crisis    HPI Carmen [name] is a 28 y.o. female.     HPI   Patient is here for evaluation of ongoing pain in her arms and legs, typical of her usual sickle cell crisis.  This was treated yesterday while she was at the emergency department at [facility].  She states that the cycle of narcotic injections was interrupted, so her last dose was delayed.  She decided at that point that she would need to come back here today.  She was able to fill her MS Contin prescription this morning, she had been out of it, yesterday.  She has taken 1 dose.  She did not take any other supplement narcotic today.  She denies nausea, vomiting, fever, chills, cough, chest pain, weakness or dizziness.  There are no other known modifying factors.  Past Medical History:  Diagnosis Date  . Headache(784.0)   . Sickle cell crisis ([place])   . Syphilis 2015   Was diagnosed and received one injection of antibiotics  . Thrombocytosis ([place]) 11/22/2014     CBC Latest Ref Rng & Units 12/13/2018 12/11/2018 12/10/2018 WBC 4.0 - 10.5 K/uL 14.8(H) 13.2(H) 15.9(H) Hemoglobin 12.0 - 15.0 g/dL 8.2(L) 7.7(L) 8.1(L) Hematocrit 36.0 - 46.0 % 23.7(L) 23.2(L) 24.5(L) Platelets 150 - 400 K/uL 326 399 388      Patient Active Problem List   Diagnosis Date Noted  . Sickle cell pain crisis ([employer]) 12/04/2018  . Alpha thalassemia silent carrier 12/04/2018  . 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 Pain in extremity   . Supervision of high risk pregnancy, antepartum 08/17/2018  . Chronic prescription opiate use 03/13/2018  . Chronic musculoskeletal pain 03/13/2018  . Vitamin D deficiency 01/13/2018  . 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 Anemia of  chronic disease   . Systolic ejection murmur 48/54/6270  . Sickle cell anemia of mother during pregnancy [facility]) 05/16/2014    Past Surgical History:  Procedure Laterality Date  . CHOLECYSTECTOMY N/A 11/30/2014   Procedure: LAPAROSCOPIC CHOLECYSTECTOMY SINGLE SITE WITH INTRAOPERATIVE CHOLANGIOGRAM;  Surgeon: Michael Boston, MD;  Location: [facility] ORS;  Service: General;  Laterality: N/A;  . SPLENECTOMY       OB History    Gravida  2   Para      Term      Preterm      AB  1   Living  0     SAB      TAB  1   Ectopic      Multiple      Live Births               Home Medications    Prior to Admission medications   Medication Sig Start Date End Date Taking? Authorizing Provider  aspirin EC 81 MG tablet Take 1 tablet (81 mg total) by mouth daily. 08/17/18  Yes [name], MD  ergocalciferol (VITAMIN D2) 50000 units capsule Take 1 capsule (50,000 Units total) by mouth once a week. 05/22/18  Yes Tresa Garter, MD  folic acid (FOLVITE) 1 MG tablet Take 1 tablet (1 mg total) by mouth daily. 08/17/18  Yes [name], MD  morphine (MS CONTIN) 30  MG 12 hr tablet Take 1 tablet (30 mg total) by mouth every 12 (twelve) hours for 28 days. 01/05/19 02/02/19 Yes Lanae Boast, FNP  Nutritional Supplements (ENSURE HEALTHY MOM) LIQD Drink 1 pouch BID Patient taking differently: Take 1 each by mouth 2 (two) times daily.  10/18/18  Yes Dove, Myra C, MD  oxyCODONE (OXY IR/ROXICODONE) 5 MG immediate release tablet Take 1 tablet (5 mg total) by mouth every 6 (six) hours as needed for up to 15 days for moderate pain. 12/29/18 01/13/19 Yes Lanae Boast, FNP  Prenatal Vit-Fe Fumarate-FA (PREPLUS) 27-1 MG TABS Take 1 tablet by mouth daily. 08/17/18  Yes [name], MD    Family History Family History  Problem Relation Age of Onset  . Hypertension Mother   . Sickle cell anemia Sister   . Kidney disease Sister        Lupus  . Arthritis Sister   . Sickle cell anemia Sister     . Sickle cell trait Sister   . Heart disease Maternal Aunt        CABG  . Heart disease Maternal Uncle        CABG  . Lupus Sister     Social History Social History   Tobacco Use  . Smoking status: Never Smoker  . Smokeless tobacco: Never Used  Substance Use Topics  . Alcohol use: No  . Drug use: No     Allergies   Patient has no known allergies.   Review of Systems Review of Systems  All other systems reviewed and are negative.    Physical Exam Updated Vital Signs BP 122/81 (BP Location: Left Arm)   Pulse 88   Temp 98.4 F (36.9 C) (Oral)   Resp 16   LMP 05/15/2018 (Approximate)   SpO2 100%   Physical Exam Vitals signs and nursing note reviewed.  Constitutional:      General: She is not in acute distress.    Appearance: She is well-developed. She is not ill-appearing, toxic-appearing or diaphoretic.  HENT:     Head: Normocephalic and atraumatic.     Right Ear: External ear normal.     Left Ear: External ear normal.  Eyes:     Conjunctiva/sclera: Conjunctivae normal.     Pupils: Pupils are equal, round, and reactive to light.  Neck:     Musculoskeletal: Normal range of motion and neck supple.     Trachea: Phonation normal.  Cardiovascular:     Rate and Rhythm: Normal rate and regular rhythm.     Heart sounds: Normal heart sounds.  Pulmonary:     Effort: Pulmonary effort is normal.     Breath sounds: Normal breath sounds.  Abdominal:     General: There is no distension.     Palpations: Abdomen is soft. There is no mass.     Tenderness: There is no abdominal tenderness. There is no guarding or rebound.     Hernia: No hernia is present.     Comments: Gravid.  Uterus nontender to palpation.  Musculoskeletal: Normal range of motion.        General: No swelling, tenderness, deformity or signs of injury.  Skin:    General: Skin is warm and dry.  Neurological:     Mental Status: She is alert and oriented to person, place, and time.     Cranial  Nerves: No cranial nerve deficit.     Sensory: No sensory deficit.     Motor: No abnormal muscle tone.  Coordination: Coordination normal.  Psychiatric:        Mood and Affect: Mood normal.        Behavior: Behavior normal.        Thought Content: Thought content normal.        Judgment: Judgment normal.      ED Treatments / Results  Labs (all labs ordered are listed, but only abnormal results are displayed) Labs Reviewed  RETICULOCYTES - Abnormal; Notable for the following components:      Result Value   Retic Ct Pct 12.3 (*)    RBC. 2.79 (*)    Retic Count, Absolute 342.6 (*)    Immature Retic Fract 53.1 (*)    All other components within normal limits  BASIC METABOLIC PANEL - Abnormal; Notable for the following components:   CO2 21 (*)    All other components within normal limits  CBC WITH DIFFERENTIAL/PLATELET - Abnormal; Notable for the following components:   WBC 13.5 (*)    RBC 2.79 (*)    Hemoglobin 8.9 (*)    HCT 27.8 (*)    RDW 16.9 (*)    nRBC 23.6 (*)    Neutro Abs 8.8 (*)    Monocytes Absolute 1.3 (*)    Abs Immature Granulocytes 0.27 (*)    All other components within normal limits    EKG None  Radiology No results found.  Procedures .Critical Care Performed by: Daleen Bo, MD Authorized by: Daleen Bo, MD   Critical care provider statement:    Critical care time (minutes):  35   Critical care start time:  01/07/2019 7:10 PM   Critical care end time:  01/07/2019 10:20 PM   Critical care time was exclusive of:  Separately billable procedures and treating other patients   Critical care was necessary to treat or prevent imminent or life-threatening deterioration of the following conditions:  Circulatory failure   Critical care was time spent personally by me on the following activities:  Blood draw for specimens, development of treatment plan with patient or surrogate, discussions with consultants, evaluation of patient's response to  treatment, examination of patient, obtaining history from patient or surrogate, ordering and performing treatments and interventions, ordering and review of laboratory studies, pulse oximetry, re-evaluation of patient's condition, review of old charts and ordering and review of radiographic studies   (including critical care time)  Medications Ordered in ED Medications  0.45 % sodium chloride infusion ( Intravenous New Bag/Given 01/07/19 2028)  HYDROmorphone (DILAUDID) injection 2 mg (has no administration in time range)    Or  HYDROmorphone (DILAUDID) injection 2 mg (has no administration in time range)  ondansetron (ZOFRAN) injection 4 mg (has no administration in time range)  HYDROmorphone (DILAUDID) injection 2 mg (2 mg Intravenous Given 01/07/19 2030)    Or  HYDROmorphone (DILAUDID) injection 2 mg ( Subcutaneous See Alternative 01/07/19 2030)  HYDROmorphone (DILAUDID) injection 2 mg (2 mg Intravenous Given 01/07/19 2127)    Or  HYDROmorphone (DILAUDID) injection 2 mg ( Subcutaneous See Alternative 01/07/19 2127)  HYDROmorphone (DILAUDID) injection 2 mg (2 mg Intravenous Given 01/07/19 2209)    Or  HYDROmorphone (DILAUDID) injection 2 mg ( Subcutaneous See Alternative 01/07/19 2209)     Initial Impression / Assessment and Plan / ED Course  I have reviewed the triage vital signs and the nursing notes.  Pertinent labs & imaging results that were available during my care of the patient were reviewed by me and considered in my medical decision making (see  chart for details).  Clinical Course as of Jan 06 2222  Nancy Fetter Jan 07, 2019  2137 Normal except white count high, hemoglobin low  CBC WITH DIFFERENTIAL(!) [EW]  2138 Normal except CO2 low  Basic metabolic panel(!) [EW]  2505 Elevated reticulocyte count  Reticulocytes(!) [EW]  2219 Fetal heart tones 142, at 2215 hrs.   [EW]    Clinical Course User Index [EW] Daleen Bo, MD        Patient Vitals for the past 24 hrs:  BP Temp Temp src  Pulse Resp SpO2  01/07/19 2037 122/81 - - 88 16 100 %  01/07/19 1900 115/73 - - 83 - 100 %  01/07/19 1836 115/77 - - 93 18 99 %  01/07/19 1817 119/79 98.4 F (36.9 C) Oral 94 20 98 %    10:20 PM-Reevaluation with update and discussion. After initial assessment and treatment, an updated evaluation reveals she reports that she feels better and has no further complaints.  Findings discussed and questions answered. Daleen Bo   Medical Decision Making: Sickle cell anemia, with stable hemoglobin, and good reticulocyte.  Suspect mild crisis.  Doubt pregnancy complication.  Doubt serious bacterial infection or metabolic instability.  CRITICAL CARE-yes Performed by: Daleen Bo  Nursing Notes Reviewed/ Care Coordinated Applicable Imaging Reviewed Interpretation of Laboratory Data incorporated into ED treatment  The patient appears reasonably screened and/or stabilized for discharge and I doubt any other medical condition or other [facility] requiring further screening, evaluation, or treatment in the ED at this time prior to discharge.  Plan: Home Medications-continue usual; Home Treatments-gradually advance diet and activity; return here if the recommended treatment, does not improve the symptoms; Recommended follow up-PCP and hematology, PRN   Final Clinical Impressions(s) / ED Diagnoses   Final diagnoses:  Sickle cell crisis ([place])  [redacted] weeks gestation of pregnancy    ED Discharge Orders    None       Daleen Bo, MD 01/07/19 2223

## 2019-01-07 NOTE — ED Notes (Signed)
Pt complains of sickle cell pain to legs and arms. Pt states she is also [redacted] weeks pregnant.

## 2019-01-08 ENCOUNTER — Ambulatory Visit (HOSPITAL_COMMUNITY): Payer: Medicaid Other | Admitting: *Deleted

## 2019-01-08 ENCOUNTER — Other Ambulatory Visit (HOSPITAL_COMMUNITY): Payer: Self-pay | Admitting: Obstetrics and Gynecology

## 2019-01-08 ENCOUNTER — Ambulatory Visit (HOSPITAL_COMMUNITY)
Admission: RE | Admit: 2019-01-08 | Discharge: 2019-01-08 | Disposition: A | Discharge status: Home / Self Care | Payer: Medicaid Other | Source: Ambulatory Visit | Attending: Obstetrics and Gynecology | Admitting: Obstetrics and Gynecology

## 2019-01-08 ENCOUNTER — Other Ambulatory Visit: Payer: Self-pay

## 2019-01-08 ENCOUNTER — Encounter (HOSPITAL_COMMUNITY): Payer: Self-pay

## 2019-01-08 DIAGNOSIS — O99323 Drug use complicating pregnancy, third trimester: Secondary | ICD-10-CM

## 2019-01-08 DIAGNOSIS — O99013 Anemia complicating pregnancy, third trimester: Secondary | ICD-10-CM | POA: Diagnosis not present

## 2019-01-08 DIAGNOSIS — Z3A34 34 weeks gestation of pregnancy: Secondary | ICD-10-CM | POA: Diagnosis not present

## 2019-01-08 DIAGNOSIS — D571 Sickle-cell disease without crisis: Secondary | ICD-10-CM | POA: Insufficient documentation

## 2019-01-08 DIAGNOSIS — O0990 Supervision of high risk pregnancy, unspecified, unspecified trimester: Secondary | ICD-10-CM | POA: Insufficient documentation

## 2019-01-08 NOTE — Procedures (Signed)
Carmen [name] Jun 22, 1991 [redacted]w[redacted]d  Fetus A Non-Stress Test Interpretation for 01/08/19  Indication: Unsatisfactory BPP  Fetal Heart Rate A Mode: External Baseline Rate (A): 145 bpm Variability: Moderate Accelerations: 15 x 15 Decelerations: None Multiple birth?: No  Uterine Activity Mode: Toco Contraction Frequency (min): OCC UC noted Contraction Duration (sec): 60-90 Contraction Quality: Mild Resting Tone Palpated: Relaxed Resting Time: Adequate  Interpretation (Fetal Testing) Nonstress Test Interpretation: Reactive Comments: FHR tracing rev'd by Dr. Donalee Citrin

## 2019-01-10 ENCOUNTER — Encounter: Payer: Self-pay | Admitting: Obstetrics and Gynecology

## 2019-01-10 ENCOUNTER — Telehealth: Payer: Self-pay | Admitting: Obstetrics and Gynecology

## 2019-01-10 NOTE — Telephone Encounter (Signed)
Called the patient to inform of missed appointment. Left a voicemail and also sending a missed appointment letter.

## 2019-01-15 ENCOUNTER — Ambulatory Visit (HOSPITAL_COMMUNITY): Payer: Medicaid Other

## 2019-01-15 ENCOUNTER — Inpatient Hospital Stay (HOSPITAL_COMMUNITY): Admission: RE | Admit: 2019-01-15 | Payer: Self-pay | Source: Ambulatory Visit

## 2019-01-16 ENCOUNTER — Encounter: Payer: Self-pay | Admitting: *Deleted

## 2019-01-22 ENCOUNTER — Other Ambulatory Visit: Payer: Self-pay

## 2019-01-22 ENCOUNTER — Ambulatory Visit (HOSPITAL_COMMUNITY)
Admission: RE | Admit: 2019-01-22 | Discharge: 2019-01-22 | Disposition: A | Discharge status: Home / Self Care | Payer: Medicaid Other | Source: Ambulatory Visit | Attending: Family Medicine | Admitting: Family Medicine

## 2019-01-22 ENCOUNTER — Ambulatory Visit (HOSPITAL_COMMUNITY): Payer: Medicaid Other | Admitting: *Deleted

## 2019-01-22 DIAGNOSIS — O99019 Anemia complicating pregnancy, unspecified trimester: Secondary | ICD-10-CM | POA: Diagnosis not present

## 2019-01-22 DIAGNOSIS — O99013 Anemia complicating pregnancy, third trimester: Secondary | ICD-10-CM | POA: Diagnosis not present

## 2019-01-22 DIAGNOSIS — Z3A36 36 weeks gestation of pregnancy: Secondary | ICD-10-CM

## 2019-01-22 DIAGNOSIS — O0990 Supervision of high risk pregnancy, unspecified, unspecified trimester: Secondary | ICD-10-CM | POA: Insufficient documentation

## 2019-01-22 DIAGNOSIS — D571 Sickle-cell disease without crisis: Secondary | ICD-10-CM | POA: Diagnosis not present

## 2019-01-22 DIAGNOSIS — O99323 Drug use complicating pregnancy, third trimester: Secondary | ICD-10-CM

## 2019-01-23 ENCOUNTER — Encounter: Payer: Self-pay | Admitting: *Deleted

## 2019-01-23 ENCOUNTER — Other Ambulatory Visit (HOSPITAL_COMMUNITY): Payer: Self-pay

## 2019-01-24 ENCOUNTER — Encounter (HOSPITAL_COMMUNITY): Payer: Self-pay | Admitting: *Deleted

## 2019-01-24 ENCOUNTER — Telehealth: Payer: Self-pay | Admitting: Obstetrics & Gynecology

## 2019-01-24 ENCOUNTER — Ambulatory Visit (INDEPENDENT_AMBULATORY_CARE_PROVIDER_SITE_OTHER): Payer: Medicaid Other | Admitting: Obstetrics & Gynecology

## 2019-01-24 ENCOUNTER — Other Ambulatory Visit: Payer: Self-pay

## 2019-01-24 ENCOUNTER — Inpatient Hospital Stay (HOSPITAL_COMMUNITY)
Admission: EM | Admit: 2019-01-24 | Discharge: 2019-02-02 | DRG: 831 | Disposition: A | Discharge status: Home / Self Care | Payer: Medicaid Other | Attending: Obstetrics and Gynecology | Admitting: Obstetrics and Gynecology

## 2019-01-24 DIAGNOSIS — F1120 Opioid dependence, uncomplicated: Secondary | ICD-10-CM | POA: Diagnosis present

## 2019-01-24 DIAGNOSIS — D571 Sickle-cell disease without crisis: Secondary | ICD-10-CM

## 2019-01-24 DIAGNOSIS — O99013 Anemia complicating pregnancy, third trimester: Secondary | ICD-10-CM

## 2019-01-24 DIAGNOSIS — O99820 Streptococcus B carrier state complicating pregnancy: Secondary | ICD-10-CM | POA: Diagnosis present

## 2019-01-24 DIAGNOSIS — O99019 Anemia complicating pregnancy, unspecified trimester: Principal | ICD-10-CM

## 2019-01-24 DIAGNOSIS — G8929 Other chronic pain: Secondary | ICD-10-CM | POA: Diagnosis present

## 2019-01-24 DIAGNOSIS — Z3A36 36 weeks gestation of pregnancy: Secondary | ICD-10-CM

## 2019-01-24 DIAGNOSIS — G894 Chronic pain syndrome: Secondary | ICD-10-CM | POA: Diagnosis present

## 2019-01-24 DIAGNOSIS — D72829 Elevated white blood cell count, unspecified: Secondary | ICD-10-CM | POA: Diagnosis present

## 2019-01-24 DIAGNOSIS — M7918 Myalgia, other site: Secondary | ICD-10-CM | POA: Diagnosis present

## 2019-01-24 DIAGNOSIS — D5700 Hb-SS disease with crisis, unspecified: Secondary | ICD-10-CM | POA: Diagnosis present

## 2019-01-24 DIAGNOSIS — O0990 Supervision of high risk pregnancy, unspecified, unspecified trimester: Secondary | ICD-10-CM

## 2019-01-24 DIAGNOSIS — Z3A37 37 weeks gestation of pregnancy: Secondary | ICD-10-CM

## 2019-01-24 DIAGNOSIS — O9912 Other diseases of the blood and blood-forming organs and certain disorders involving the immune mechanism complicating childbirth: Secondary | ICD-10-CM | POA: Diagnosis present

## 2019-01-24 LAB — RETICULOCYTES
Immature Retic Fract: 48.3 % — ABNORMAL HIGH (ref 2.3–15.9)
RBC.: 2.46 MIL/uL — ABNORMAL LOW (ref 3.87–5.11)
Retic Count, Absolute: 278.7 10*3/uL — ABNORMAL HIGH (ref 19.0–186.0)
Retic Ct Pct: 11.3 % — ABNORMAL HIGH (ref 0.4–3.1)

## 2019-01-24 MED ORDER — HYDROMORPHONE HCL 2 MG/ML IJ SOLN
2.0000 mg | INTRAMUSCULAR | Status: AC
  Administered 2019-01-25: 2 mg via INTRAVENOUS
  Filled 2019-01-24: qty 1

## 2019-01-24 MED ORDER — DIPHENHYDRAMINE HCL 25 MG PO CAPS
25.0000 mg | ORAL_CAPSULE | ORAL | Status: DC | PRN
  Administered 2019-01-25: 25 mg via ORAL
  Filled 2019-01-24: qty 1

## 2019-01-24 MED ORDER — HYDROMORPHONE HCL 2 MG/ML IJ SOLN: 2.0000 mg | INTRAMUSCULAR | Status: AC

## 2019-01-24 MED ORDER — SODIUM CHLORIDE 0.9% FLUSH
3.0000 mL | Freq: Once | INTRAVENOUS | Status: AC
  Administered 2019-01-24: 3 mL via INTRAVENOUS

## 2019-01-24 NOTE — Patient Instructions (Signed)
Return to office for any scheduled appointments. Call the office or go to the MAU at [facility] if:  You begin to have strong, frequent contractions  Your water breaks.  Sometimes it is a big gush of fluid, sometimes it is just a trickle that keeps getting your panties wet or running down your legs  You have vaginal bleeding.  It is normal to have a small amount of spotting if your cervix was checked.   You do not feel your baby moving like normal.  If you do not, get something to eat and drink and lay down and focus on feeling your baby move.   If your baby is still not moving like normal, you should call the office or go to MAU.  Any other obstetric concerns.   

## 2019-01-24 NOTE — Telephone Encounter (Signed)
Patient called to say due to COVID-19 her transportation was canceled. She is requesting a virtual call.

## 2019-01-24 NOTE — ED Triage Notes (Signed)
Pt reports generalized sickle cell pain in legs and arms. Reports that her doctor told her to stop taking some of her pain meds because she is pregnant and now is having pain.

## 2019-01-24 NOTE — ED Triage Notes (Signed)
Pt c/o pain to all 4 extremities & back x 4 days.  Pt stated "I called the clinic today but they told me to come to the ER".

## 2019-01-24 NOTE — Progress Notes (Signed)
   TELEHEALTH VIRTUAL OBSTETRICS VISIT ENCOUNTER NOTE  I connected with Carmen [name] on 01/24/19 at  4:15 PM EDT by telephone at home and verified that I am speaking with the correct person using two identifiers.   I discussed the limitations, risks, security and privacy concerns of performing an evaluation and management service by telephone and the availability of in person appointments. I also discussed with the patient that there may be a patient responsible charge related to this service. The patient expressed understanding and agreed to proceed.  Subjective:  Carmen [name] is a 28 y.o. G2P0010 at [redacted]w[redacted]d being followed for ongoing prenatal care.  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  Patient reports that she has periods that pain is out of control, has called [facility] and her medications have been adjusted, but she is still in a lot of pain in her legs and her back.  Reports good fetal movement. Denies any contractions, bleeding or leaking of fluid.   The following portions of the patient's history were reviewed and updated as appropriate: allergies, current medications, past family history, past medical history, past social history, past surgical history and problem list.   Objective:   General:  Alert, oriented and cooperative.   Mental Status: Normal mood and affect perceived. Normal judgment and thought content.  Rest of physical exam deferred due to  type of encounter  Assessment and Plan:  Pregnancy: G2P0010 at 103w2d 1. 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  She reported she may go in as early as tonight if her pain is not under control.  MAU staff notified of this possibility.  2. Supervision of high risk pregnancy, antepartum Preterm labor symptoms and general obstetric precautions including but not limited to vaginal bleeding, contractions, leaking of fluid and fetal movement were reviewed in detail with the patient.  Follow up tentatively scheduled for next week. I discussed the assessment and treatment plan with the patient. The patient was provided an opportunity to ask questions and all were answered. The patient agreed with the plan and demonstrated an understanding of the instructions. The patient was advised to call back or seek an in-person office evaluation/go to MAU at [facility] for any urgent or concerning symptoms. Please refer to After Visit Summary for other counseling recommendations.    Future Appointments  Date Time Provider [place]  01/25/2019 10:20 AM Lanae Boast, FNP [facility] None  01/29/2019  1:20 PM [place] [facility]  01/29/2019  1:30 PM [place] 1 [facility]-MFCUS MFC-US    Verita Schneiders, [place] for [place], [place]

## 2019-01-25 ENCOUNTER — Ambulatory Visit: Payer: Self-pay | Admitting: Family Medicine

## 2019-01-25 ENCOUNTER — Telehealth: Payer: Self-pay

## 2019-01-25 ENCOUNTER — Encounter (HOSPITAL_COMMUNITY): Payer: Self-pay | Admitting: Family Medicine

## 2019-01-25 DIAGNOSIS — Z3A37 37 weeks gestation of pregnancy: Secondary | ICD-10-CM | POA: Diagnosis not present

## 2019-01-25 DIAGNOSIS — O99013 Anemia complicating pregnancy, third trimester: Secondary | ICD-10-CM | POA: Diagnosis present

## 2019-01-25 DIAGNOSIS — Z3689 Encounter for other specified antenatal screening: Secondary | ICD-10-CM | POA: Diagnosis not present

## 2019-01-25 DIAGNOSIS — O9912 Other diseases of the blood and blood-forming organs and certain disorders involving the immune mechanism complicating childbirth: Secondary | ICD-10-CM | POA: Diagnosis present

## 2019-01-25 DIAGNOSIS — Z362 Encounter for other antenatal screening follow-up: Secondary | ICD-10-CM | POA: Diagnosis not present

## 2019-01-25 DIAGNOSIS — D72829 Elevated white blood cell count, unspecified: Secondary | ICD-10-CM | POA: Diagnosis present

## 2019-01-25 DIAGNOSIS — Z3A36 36 weeks gestation of pregnancy: Secondary | ICD-10-CM

## 2019-01-25 DIAGNOSIS — M7918 Myalgia, other site: Secondary | ICD-10-CM

## 2019-01-25 DIAGNOSIS — O99323 Drug use complicating pregnancy, third trimester: Secondary | ICD-10-CM | POA: Diagnosis not present

## 2019-01-25 DIAGNOSIS — D5700 Hb-SS disease with crisis, unspecified: Secondary | ICD-10-CM | POA: Diagnosis present

## 2019-01-25 DIAGNOSIS — F1120 Opioid dependence, uncomplicated: Secondary | ICD-10-CM | POA: Diagnosis present

## 2019-01-25 DIAGNOSIS — O99820 Streptococcus B carrier state complicating pregnancy: Secondary | ICD-10-CM | POA: Diagnosis present

## 2019-01-25 DIAGNOSIS — D571 Sickle-cell disease without crisis: Secondary | ICD-10-CM

## 2019-01-25 DIAGNOSIS — G8929 Other chronic pain: Secondary | ICD-10-CM

## 2019-01-25 DIAGNOSIS — O4202 Full-term premature rupture of membranes, onset of labor within 24 hours of rupture: Secondary | ICD-10-CM | POA: Diagnosis not present

## 2019-01-25 DIAGNOSIS — G894 Chronic pain syndrome: Secondary | ICD-10-CM | POA: Diagnosis present

## 2019-01-25 DIAGNOSIS — M79606 Pain in leg, unspecified: Secondary | ICD-10-CM | POA: Diagnosis present

## 2019-01-25 DIAGNOSIS — Z79891 Long term (current) use of opiate analgesic: Secondary | ICD-10-CM

## 2019-01-25 LAB — CBC WITH DIFFERENTIAL/PLATELET
Abs Immature Granulocytes: 0.24 10*3/uL — ABNORMAL HIGH (ref 0.00–0.07)
BASOS PCT: 1 %
Basophils Absolute: 0.1 10*3/uL (ref 0.0–0.1)
EOS ABS: 0.2 10*3/uL (ref 0.0–0.5)
Eosinophils Relative: 1 %
HCT: 23.7 % — ABNORMAL LOW (ref 36.0–46.0)
Hemoglobin: 8.1 g/dL — ABNORMAL LOW (ref 12.0–15.0)
Immature Granulocytes: 2 %
Lymphocytes Relative: 21 %
Lymphs Abs: 2.7 10*3/uL (ref 0.7–4.0)
MCH: 32.9 pg (ref 26.0–34.0)
MCHC: 34.2 g/dL (ref 30.0–36.0)
MCV: 96.3 fL (ref 80.0–100.0)
Monocytes Absolute: 1.7 10*3/uL — ABNORMAL HIGH (ref 0.1–1.0)
Monocytes Relative: 13 %
NEUTROS ABS: 8.1 10*3/uL — AB (ref 1.7–7.7)
NEUTROS PCT: 62 %
PLATELETS: 344 10*3/uL (ref 150–400)
RBC: 2.46 MIL/uL — ABNORMAL LOW (ref 3.87–5.11)
RDW: 16.3 % — ABNORMAL HIGH (ref 11.5–15.5)
WBC: 13 10*3/uL — ABNORMAL HIGH (ref 4.0–10.5)
nRBC: 19.7 % — ABNORMAL HIGH (ref 0.0–0.2)

## 2019-01-25 LAB — COMPREHENSIVE METABOLIC PANEL
ALT: 21 U/L (ref 0–44)
AST: 40 U/L (ref 15–41)
Albumin: 2.5 g/dL — ABNORMAL LOW (ref 3.5–5.0)
Alkaline Phosphatase: 400 U/L — ABNORMAL HIGH (ref 38–126)
Anion gap: 6 (ref 5–15)
BILIRUBIN TOTAL: 1.6 mg/dL — AB (ref 0.3–1.2)
BUN: 9 mg/dL (ref 6–20)
CALCIUM: 8.3 mg/dL — AB (ref 8.9–10.3)
CO2: 21 mmol/L — ABNORMAL LOW (ref 22–32)
Chloride: 110 mmol/L (ref 98–111)
Creatinine, Ser: 0.58 mg/dL (ref 0.44–1.00)
GFR calc Af Amer: >60 mL/min (ref >60)
GFR calc non Af Amer: >60 mL/min (ref >60)
Glucose, Bld: 101 mg/dL — ABNORMAL HIGH (ref 70–99)
Potassium: 3.7 mmol/L (ref 3.5–5.1)
Sodium: 137 mmol/L (ref 135–145)
Total Protein: 7 g/dL (ref 6.5–8.1)

## 2019-01-25 LAB — OB RESULTS CONSOLE GC/CHLAMYDIA: Gonorrhea: NEGATIVE

## 2019-01-25 LAB — OB RESULTS CONSOLE GBS: GBS: POSITIVE

## 2019-01-25 MED ORDER — ENOXAPARIN SODIUM 40 MG/0.4ML ~~LOC~~ SOLN
40.0000 mg | SUBCUTANEOUS | Status: DC
  Administered 2019-01-31 – 2019-02-02 (×2): 40 mg via SUBCUTANEOUS
  Filled 2019-01-25 (×5): qty 0.4

## 2019-01-25 MED ORDER — HYDROMORPHONE HCL 2 MG/ML IJ SOLN
2.0000 mg | INTRAMUSCULAR | Status: AC
  Administered 2019-01-25: 2 mg via INTRAVENOUS
  Filled 2019-01-25: qty 1

## 2019-01-25 MED ORDER — MORPHINE SULFATE ER 30 MG PO TBCR: 30.0000 mg | EXTENDED_RELEASE_TABLET | Freq: Two times a day (BID) | ORAL | 0 refills | Status: DC

## 2019-01-25 MED ORDER — PRENATAL MULTIVITAMIN CH
1.0000 | ORAL_TABLET | Freq: Every day | ORAL | Status: DC
  Administered 2019-01-25 – 2019-02-02 (×9): 1 via ORAL
  Filled 2019-01-25 (×9): qty 1

## 2019-01-25 MED ORDER — SODIUM CHLORIDE 0.9 % IV SOLN
25.0000 mg | INTRAVENOUS | Status: DC | PRN
  Filled 2019-01-25: qty 0.5

## 2019-01-25 MED ORDER — ZOLPIDEM TARTRATE 5 MG PO TABS
5.0000 mg | ORAL_TABLET | Freq: Every evening | ORAL | Status: DC | PRN
  Administered 2019-01-25 – 2019-02-01 (×7): 5 mg via ORAL
  Filled 2019-01-25 (×7): qty 1

## 2019-01-25 MED ORDER — PRENATAL MULTIVITAMIN CH: 1.0000 | ORAL_TABLET | Freq: Every day | ORAL | Status: DC

## 2019-01-25 MED ORDER — MORPHINE SULFATE ER 30 MG PO TBCR
30.0000 mg | EXTENDED_RELEASE_TABLET | Freq: Three times a day (TID) | ORAL | Status: DC
  Administered 2019-01-25 (×3): 30 mg via ORAL
  Filled 2019-01-25 (×3): qty 1

## 2019-01-25 MED ORDER — ENSURE ENLIVE PO LIQD
237.0000 mL | Freq: Two times a day (BID) | ORAL | Status: DC
  Administered 2019-01-25 – 2019-01-26 (×2): 237 mL via ORAL
  Filled 2019-01-25 (×5): qty 237

## 2019-01-25 MED ORDER — HYDROMORPHONE 1 MG/ML IV SOLN
INTRAVENOUS | Status: DC
  Administered 2019-01-25: 4.9 mg via INTRAVENOUS
  Administered 2019-01-25: 30 mg via INTRAVENOUS
  Administered 2019-01-25: 7.7 mg via INTRAVENOUS
  Administered 2019-01-25: 2.8 mg via INTRAVENOUS
  Administered 2019-01-25: 25 mg via INTRAVENOUS
  Administered 2019-01-25: 7.1 mg via INTRAVENOUS
  Filled 2019-01-25: qty 30
  Filled 2019-01-25: qty 25

## 2019-01-25 MED ORDER — DEXTROSE-NACL 5-0.45 % IV SOLN
INTRAVENOUS | Status: DC
  Administered 2019-01-25: 03:00:00 via INTRAVENOUS

## 2019-01-25 MED ORDER — ACETAMINOPHEN 325 MG PO TABS: 650.0000 mg | ORAL_TABLET | ORAL | Status: DC | PRN

## 2019-01-25 MED ORDER — SODIUM CHLORIDE 0.9% FLUSH: 9.0000 mL | INTRAVENOUS | Status: DC | PRN

## 2019-01-25 MED ORDER — HYDROMORPHONE HCL 2 MG/ML IJ SOLN: 2.0000 mg | INTRAMUSCULAR | Status: AC

## 2019-01-25 MED ORDER — DIPHENHYDRAMINE HCL 25 MG PO CAPS
25.0000 mg | ORAL_CAPSULE | ORAL | Status: DC | PRN
  Administered 2019-01-25: 25 mg via ORAL
  Filled 2019-01-25: qty 1

## 2019-01-25 MED ORDER — ONDANSETRON HCL 4 MG/2ML IJ SOLN
4.0000 mg | INTRAMUSCULAR | Status: DC | PRN
  Administered 2019-01-25 – 2019-01-30 (×6): 4 mg via INTRAVENOUS
  Filled 2019-01-25 (×4): qty 2

## 2019-01-25 MED ORDER — NALOXONE HCL 0.4 MG/ML IJ SOLN: 0.4000 mg | INTRAMUSCULAR | Status: DC | PRN

## 2019-01-25 MED ORDER — ONDANSETRON HCL 4 MG/2ML IJ SOLN
4.0000 mg | Freq: Four times a day (QID) | INTRAMUSCULAR | Status: DC | PRN
  Filled 2019-01-25 (×2): qty 2

## 2019-01-25 MED ORDER — FOLIC ACID 1 MG PO TABS
1.0000 mg | ORAL_TABLET | Freq: Every day | ORAL | Status: DC
  Administered 2019-01-25 – 2019-02-02 (×9): 1 mg via ORAL
  Filled 2019-01-25 (×9): qty 1

## 2019-01-25 MED ORDER — SODIUM CHLORIDE 0.45 % IV SOLN
INTRAVENOUS | Status: AC
  Administered 2019-01-25: 05:00:00 via INTRAVENOUS

## 2019-01-25 MED ORDER — ONDANSETRON HCL 4 MG PO TABS: 4.0000 mg | ORAL_TABLET | ORAL | Status: DC | PRN

## 2019-01-25 MED ORDER — DOCUSATE SODIUM 100 MG PO CAPS
100.0000 mg | ORAL_CAPSULE | Freq: Every day | ORAL | Status: DC
  Administered 2019-01-25 – 2019-02-02 (×9): 100 mg via ORAL
  Filled 2019-01-25 (×9): qty 1

## 2019-01-25 MED ORDER — LACTATED RINGERS IV SOLN
INTRAVENOUS | Status: DC
  Administered 2019-01-25 – 2019-02-02 (×19): via INTRAVENOUS

## 2019-01-25 MED ORDER — HYDROMORPHONE HCL 2 MG/ML IJ SOLN
2.0000 mg | Freq: Once | INTRAMUSCULAR | Status: AC
  Administered 2019-01-25: 2 mg via INTRAVENOUS
  Filled 2019-01-25: qty 1

## 2019-01-25 MED ORDER — POLYETHYLENE GLYCOL 3350 17 G PO PACK: 17.0000 g | PACK | Freq: Every day | ORAL | Status: DC | PRN

## 2019-01-25 MED ORDER — OXYCODONE HCL 5 MG PO TABS: 5.0000 mg | ORAL_TABLET | Freq: Four times a day (QID) | ORAL | 0 refills | Status: DC | PRN

## 2019-01-25 MED ORDER — CALCIUM CARBONATE ANTACID 500 MG PO CHEW: 2.0000 | CHEWABLE_TABLET | ORAL | Status: DC | PRN

## 2019-01-25 NOTE — Progress Notes (Signed)
Reported to oncoming RN  To notify MD if blood needed for pt only 1unit available and tomorrow 1 will come from [place]

## 2019-01-25 NOTE — ED Provider Notes (Signed)
[place] DEPT Provider Note   CSN: [id number] Arrival date & time: 01/24/19  2254    History   Chief Complaint Chief Complaint  Patient presents with  . Sickle Cell Pain Crisis    HPI TYECHIA ALLMENDINGER is a 28 y.o. female.     HPI 33 old female with history of sickle cell anemia comes in with chief complaint of pain.  She is also G1, P0 at about [redacted] weeks pregnant.  She states that she started having her sickle cell pain that is located in her upper and lower extremities 2 days ago.  Patient has been taking oral medications that comprised of morphine and oxycodone without significant relief.  Her pain at the moment is 10 out of 10.  She denies any recent infection, fevers, cough, UTI-like symptoms, vaginal bleeding, vaginal discharge, abdominal pain.  She thinks that the symptoms were triggered by the weather change.  Past Medical History:  Diagnosis Date  . Headache(784.0)   . Sickle cell crisis ([place])   . Syphilis 2015   Was diagnosed and received one injection of antibiotics  . Thrombocytosis ([place]) 11/22/2014     CBC Latest Ref Rng & Units 12/13/2018 12/11/2018 12/10/2018 WBC 4.0 - 10.5 K/uL 14.8(H) 13.2(H) 15.9(H) Hemoglobin 12.0 - 15.0 g/dL 8.2(L) 7.7(L) 8.1(L) Hematocrit 36.0 - 46.0 % 23.7(L) 23.2(L) 24.5(L) Platelets 150 - 400 K/uL [phone]      Patient Active Problem List   Diagnosis Date Noted  . Sickle cell pain crisis ([place]) 12/04/2018  . Alpha thalassemia silent carrier 12/04/2018  . 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 Pain in extremity   . Supervision of high risk pregnancy, antepartum 08/17/2018  . Chronic prescription opiate use 03/13/2018  . Chronic musculoskeletal pain 03/13/2018  . Vitamin D deficiency 01/13/2018  . 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 Hb-SS disease without crisis ([place]) 08/15/2016  . Anemia of chronic disease   . Systolic ejection murmur 62/69/4854  . Sickle cell anemia of  mother during pregnancy [facility]) 05/16/2014    Past Surgical History:  Procedure Laterality Date  . CHOLECYSTECTOMY N/A 11/30/2014   Procedure: LAPAROSCOPIC CHOLECYSTECTOMY SINGLE SITE WITH INTRAOPERATIVE CHOLANGIOGRAM;  Surgeon: Michael Boston, MD;  Location: [facility] ORS;  Service: General;  Laterality: N/A;  . SPLENECTOMY       OB History    Gravida  2   Para      Term      Preterm      AB  1   Living  0     SAB      TAB  1   Ectopic      Multiple      Live Births               Home Medications    Prior to Admission medications   Medication Sig Start Date End Date Taking? Authorizing Provider  aspirin EC 81 MG tablet Take 1 tablet (81 mg total) by mouth daily. 08/17/18  Yes [name], MD  ergocalciferol (VITAMIN D2) 50000 units capsule Take 1 capsule (50,000 Units total) by mouth once a week. 05/22/18  Yes Tresa Garter, MD  folic acid (FOLVITE) 1 MG tablet Take 1 tablet (1 mg total) by mouth daily. 08/17/18  Yes [name], MD  morphine (MS CONTIN) 30 MG 12 hr tablet Take 1 tablet (30 mg total) by mouth every 12 (twelve) hours for 28 days. Patient  taking differently: Take 30 mg by mouth every 8 (eight) hours.  01/05/19 02/02/19 Yes Lanae Boast, FNP  Nutritional Supplements (ENSURE HEALTHY MOM) LIQD Drink 1 pouch BID Patient taking differently: Take 1 each by mouth 2 (two) times daily.  10/18/18  Yes Dove, Myra C, MD  oxyCODONE (OXY IR/ROXICODONE) 5 MG immediate release tablet Take 5 mg by mouth every 6 (six) hours as needed for severe pain.   Yes [provider]  Prenatal Vit-Fe Fumarate-FA (PREPLUS) 27-1 MG TABS Take 1 tablet by mouth daily. 08/17/18  Yes [name], MD    Family History Family History  Problem Relation Age of Onset  . Hypertension Mother   . Sickle cell anemia Sister   . Kidney disease Sister        Lupus  . Arthritis Sister   . Sickle cell anemia Sister   . Sickle cell trait Sister   . Heart disease Maternal  Aunt        CABG  . Heart disease Maternal Uncle        CABG  . Lupus Sister     Social History Social History   Tobacco Use  . Smoking status: Never Smoker  . Smokeless tobacco: Never Used  Substance Use Topics  . Alcohol use: No  . Drug use: No     Allergies   Patient has no known allergies.   Review of Systems Review of Systems  Constitutional: Positive for activity change.  Respiratory: Negative for cough, chest tightness and shortness of breath.   Cardiovascular: Negative for chest pain.  Gastrointestinal: Negative for abdominal pain.  Genitourinary: Negative for dysuria.  Musculoskeletal: Positive for arthralgias and myalgias.  All other systems reviewed and are negative.    Physical Exam Updated Vital Signs BP 121/80   Pulse 89   Temp 99.1 F (37.3 C) (Oral)   Resp 19   Ht _0  (1.6 m)   Wt 77.1 kg   LMP 05/15/2018 (Approximate)   SpO2 99%   BMI 30.11 kg/m   Physical Exam Vitals signs and nursing note reviewed.  Constitutional:      Appearance: She is well-developed.  HENT:     Head: Normocephalic and atraumatic.  Neck:     Musculoskeletal: Normal range of motion and neck supple.  Cardiovascular:     Rate and Rhythm: Normal rate.  Pulmonary:     Effort: Pulmonary effort is normal.  Abdominal:     General: Bowel sounds are normal.  Skin:    General: Skin is warm and dry.  Neurological:     Mental Status: She is alert and oriented to person, place, and time.      ED Treatments / Results  Labs (all labs ordered are listed, but only abnormal results are displayed) Labs Reviewed  COMPREHENSIVE METABOLIC PANEL - Abnormal; Notable for the following components:      Result Value   CO2 21 (*)    Glucose, Bld 101 (*)    Calcium 8.3 (*)    Albumin 2.5 (*)    Alkaline Phosphatase 400 (*)    Total Bilirubin 1.6 (*)    All other components within normal limits  CBC WITH DIFFERENTIAL/PLATELET - Abnormal; Notable for the following  components:   WBC 13.0 (*)    RBC 2.46 (*)    Hemoglobin 8.1 (*)    HCT 23.7 (*)    RDW 16.3 (*)    nRBC 19.7 (*)    Neutro Abs 8.1 (*)  Monocytes Absolute 1.7 (*)    Abs Immature Granulocytes 0.24 (*)    All other components within normal limits  RETICULOCYTES - Abnormal; Notable for the following components:   Retic Ct Pct 11.3 (*)    RBC. 2.46 (*)    Retic Count, Absolute 278.7 (*)    Immature Retic Fract 48.3 (*)    All other components within normal limits    EKG None  Radiology No results found.  Procedures Procedures (including critical care time)  Medications Ordered in ED Medications  diphenhydrAMINE (BENADRYL) capsule 25-50 mg (25 mg Oral Given 01/25/19 0004)  dextrose 5 %-0.45 % sodium chloride infusion ( Intravenous New Bag/Given 01/25/19 0311)  sodium chloride flush (NS) 0.9 % injection 3 mL (3 mLs Intravenous Given 01/24/19 2350)  HYDROmorphone (DILAUDID) injection 2 mg (2 mg Intravenous Given 01/25/19 0003)    Or  HYDROmorphone (DILAUDID) injection 2 mg ( Subcutaneous See Alternative 01/25/19 0003)  HYDROmorphone (DILAUDID) injection 2 mg (2 mg Intravenous Given 01/25/19 0101)    Or  HYDROmorphone (DILAUDID) injection 2 mg ( Subcutaneous See Alternative 01/25/19 0101)  HYDROmorphone (DILAUDID) injection 2 mg (2 mg Intravenous Given 01/25/19 0208)    Or  HYDROmorphone (DILAUDID) injection 2 mg ( Subcutaneous See Alternative 01/25/19 0208)  HYDROmorphone (DILAUDID) injection 2 mg (2 mg Intravenous Given 01/25/19 0309)    Or  HYDROmorphone (DILAUDID) injection 2 mg ( Subcutaneous See Alternative 01/25/19 0309)     Initial Impression / Assessment and Plan / ED Course  I have reviewed the triage vital signs and the nursing notes.  Pertinent labs & imaging results that were available during my care of the patient were reviewed by me and considered in my medical decision making (see chart for details).       Pt comes in with sickle cell related pain.  VSS and  WNL -  hemodynamically stable.  Pain appears vaso-occlusive tpye and typical of previous pain. Will start mild hydration with 1/2 NS while patient is getting her workup. Appropriate labs ordered.  Pain control . We will reassess patient after 3 doses. Goal is to break the pain and see if patient feels comfortable going home.  Currently, there is no signs of severe decompensation clinically. Will continue to monitor closely. If we are unable to control the pain, we will admit the patient.   3:40 AM Patient received 3 rounds of medication and she reports her pain continues to be significantly high.  We will give her 1 more dose of medicine and reassess.  If patient is not trending in the right direction we will admit her.  Her labs show hemoglobin of 8.1 with mild white count elevation.  Alk phos and bili are slightly elevated.   Reassessment: Patient continues to have persistent pain.  We will proceed with admission.  Final Clinical Impressions(s) / ED Diagnoses   Final diagnoses:  Sickle cell pain crisis [facility])    ED Discharge Orders    None       Varney Biles, MD 01/25/19 [phone]

## 2019-01-25 NOTE — H&P (Signed)
History and Physical    Carmen [name]:[id number] DOB: Nov 13, 1990 DOA: 01/24/2019  PCP: Lanae Boast, FNP   Patient coming from: Home   Chief Complaint: Pain in legs, back, and arms  HPI: Carmen [name] is a 28 y.o. G2P0010 at [redacted]w[redacted]d female with medical history significant for sickle cell anemia, now presenting to the emergency department for evaluation of severe pain involving her extremities and back.  Patient reports that she had been doing well until approximately 3 days ago when she developed pain in her feet bilaterally.  Over the ensuing days, pain has become more severe and now involves the bilateral legs, bilateral upper extremities, and back, consistent with most of her prior pain crises.  She denies any fevers, shortness of breath, cough, or chest pain.  She denies any abdominal or pelvic pain, bleeding or vaginal discharge, or headache.  She had been experiencing some foot and ankle swelling a few days ago that has since resolved.  ED Course: Upon arrival to the ED, patient is found to be afebrile, saturating well on room air, and with vitals otherwise stable.  CBC is notable for leukocytosis to 13,000 and a normocytic anemia with hemoglobin 8.1, both indices similar to priors.  Chemistry panel is notable for chronic elevations in alkaline phosphatase and bilirubin.  Patient was given IV fluids and multiple doses of IV Dilaudid, reports some transient improvement, but continues to have severe pain and will be admitted for ongoing evaluation and management.  Case was discussed with on-call physician for [facility] [place] who agreed with a medical admission to [facility].   Review of Systems:  All other systems reviewed and apart from HPI, are negative.  Past Medical History:  Diagnosis Date  . Headache(784.0)   . Sickle cell crisis ([place])   . Syphilis 2015   Was diagnosed and received one injection of antibiotics  . Thrombocytosis ([place]) 11/22/2014     CBC  Latest Ref Rng & Units 12/13/2018 12/11/2018 12/10/2018 WBC 4.0 - 10.5 K/uL 14.8(H) 13.2(H) 15.9(H) Hemoglobin 12.0 - 15.0 g/dL 8.2(L) 7.7(L) 8.1(L) Hematocrit 36.0 - 46.0 % 23.7(L) 23.2(L) 24.5(L) Platelets 150 - 400 K/uL 326 399 388      Past Surgical History:  Procedure Laterality Date  . CHOLECYSTECTOMY N/A 11/30/2014   Procedure: LAPAROSCOPIC CHOLECYSTECTOMY SINGLE SITE WITH INTRAOPERATIVE CHOLANGIOGRAM;  Surgeon: Michael Boston, MD;  Location: [facility]S;  Service: General;  Laterality: N/A;  . SPLENECTOMY       reports that she has never smoked. She has never used smokeless tobacco. She reports that she does not drink alcohol or use drugs.  No Known Allergies  Family History  Problem Relation Age of Onset  . Hypertension Mother   . Sickle cell anemia Sister   . Kidney disease Sister        Lupus  . Arthritis Sister   . Sickle cell anemia Sister   . Sickle cell trait Sister   . Heart disease Maternal Aunt        CABG  . Heart disease Maternal Uncle        CABG  . Lupus Sister      Prior to Admission medications   Medication Sig Start Date End Date Taking? Authorizing Provider  aspirin EC 81 MG tablet Take 1 tablet (81 mg total) by mouth daily. 08/17/18  Yes [name], MD  ergocalciferol (VITAMIN D2) 50000 units capsule Take 1 capsule (50,000 Units total) by mouth once a week. 05/22/18  Yes Tresa Garter, MD  folic acid (FOLVITE) 1 MG tablet Take 1 tablet (1 mg total) by mouth daily. 08/17/18  Yes [name], MD  morphine (MS CONTIN) 30 MG 12 hr tablet Take 1 tablet (30 mg total) by mouth every 12 (twelve) hours for 28 days. Patient taking differently: Take 30 mg by mouth every 8 (eight) hours.  01/05/19 02/02/19 Yes Lanae Boast, FNP  Nutritional Supplements (ENSURE HEALTHY MOM) LIQD Drink 1 pouch BID Patient taking differently: Take 1 each by mouth 2 (two) times daily.  10/18/18  Yes Dove, Myra C, MD  oxyCODONE (OXY IR/ROXICODONE) 5 MG immediate release tablet  Take 5 mg by mouth every 6 (six) hours as needed for severe pain.   Yes [provider]  Prenatal Vit-Fe Fumarate-FA (PREPLUS) 27-1 MG TABS Take 1 tablet by mouth daily. 08/17/18  Yes [name], MD    Physical Exam: Vitals:   01/25/19 0005 01/25/19 0030 01/25/19 0130 01/25/19 0330  BP: 120/82 110/74 121/80 107/70  Pulse: 99 81 89 73  Resp: 19 17 19 18   Temp:      TempSrc:      SpO2: 100% 99% 99% 98%  Weight:      Height:        Constitutional: NAD, appears uncomfortable  Eyes: PERTLA, lids and conjunctivae normal ENMT: Mucous membranes are moist. Posterior pharynx clear of any exudate or lesions.   Neck: normal, supple, no masses, no thyromegaly Respiratory: clear to auscultation bilaterally, no wheezing, no crackles. Normal respiratory effort.    Cardiovascular: S1 & S2 heard, regular rate and rhythm. No significant JVD. Abdomen: gravid uterus, soft, no tenderness. Bowel sounds active.  Musculoskeletal: no clubbing / cyanosis. No joint deformity upper and lower extremities.    Skin: no significant rashes, lesions, ulcers. Warm, dry, well-perfused. Neurologic: no facial asymmetry. Sensation intact. Moving all extremities.  Psychiatric: Alert and oriented x 3. Pleasant and cooperative.    Labs on Admission: I have personally reviewed following labs and imaging studies  CBC: Recent Labs  Lab 01/24/19 2335  WBC 13.0*  NEUTROABS 8.1*  HGB 8.1*  HCT 23.7*  MCV 96.3  PLT 203   Basic Metabolic Panel: Recent Labs  Lab 01/24/19 2335  NA 137  K 3.7  CL 110  CO2 21*  GLUCOSE 101*  BUN 9  CREATININE 0.58  CALCIUM 8.3*   GFR: Estimated Creatinine Clearance: 105.7 mL/min (by C-G formula based on SCr of 0.58 mg/dL). Liver Function Tests: Recent Labs  Lab 01/24/19 2335  AST 40  ALT 21  ALKPHOS 400*  BILITOT 1.6*  PROT 7.0  ALBUMIN 2.5*   No results for input(s): LIPASE, AMYLASE in the last 168 hours. No results for input(s): AMMONIA in the last  168 hours. Coagulation Profile: No results for input(s): INR, PROTIME in the last 168 hours. Cardiac Enzymes: No results for input(s): CKTOTAL, CKMB, CKMBINDEX, TROPONINI in the last 168 hours. BNP (last 3 results) No results for input(s): PROBNP in the last 8760 hours. HbA1C: No results for input(s): HGBA1C in the last 72 hours. CBG: No results for input(s): GLUCAP in the last 168 hours. Lipid Profile: No results for input(s): CHOL, HDL, LDLCALC, TRIG, CHOLHDL, LDLDIRECT in the last 72 hours. Thyroid Function Tests: No results for input(s): TSH, T4TOTAL, FREET4, T3FREE, THYROIDAB in the last 72 hours. Anemia Panel: Recent Labs    01/24/19 2335  RETICCTPCT 11.3*   Urine analysis:    Component Value Date/Time   COLORURINE  YELLOW 01/06/2019 [name] 01/06/2019 1307   LABSPEC 1.006 01/06/2019 1307   PHURINE 7.0 01/06/2019 1307   GLUCOSEU NEGATIVE 01/06/2019 1307   HGBUR NEGATIVE 01/06/2019 1307   BILIRUBINUR NEGATIVE 01/06/2019 1307   BILIRUBINUR small 11/30/2018 1025   [place] 01/06/2019 1307   PROTEINUR NEGATIVE 01/06/2019 1307   UROBILINOGEN >=8.0 (A) 11/30/2018 1025   UROBILINOGEN 1.0 01/12/2018 0943   NITRITE NEGATIVE 01/06/2019 1307   LEUKOCYTESUR NEGATIVE 01/06/2019 1307   Sepsis Labs: @LABRCNTIP (procalcitonin:4,lacticidven:4) )No results found for this or any previous visit (from the past 240 hour(s)).   Radiological Exams on Admission: No results found.  EKG: Not performed.   Assessment/Plan   1. Sickle cell anemia with pain crisis; chronic pain  - Presents with 3 days of worsening pain in extremities and back, similar to her prior pain crises  - She reports some improvement with IVF hydration and 4 doses of IV Dilaudid in ED, but continues to have severe pain  - Continue long-acting morphine, continue folate, start wt-based Dilaudid PCA, continue IVF hydration    2. Pregnancy  - G2P0010 at [redacted]w[redacted]d admitted for management of  sickle cell pain crisis - OB/GYN following and much appreciated      DVT prophylaxis: Lovenox  Code Status: Full  Family Communication: Discussed with patient  Consults called: [facility]  Admission status: Inpatient     Vianne Bulls, MD [facility] Pager [phone]  If 7PM-7AM, please contact night-coverage [email or web address] Password [email or web address]  01/25/2019, 4:06 AM

## 2019-01-25 NOTE — ED Notes (Signed)
Fetal Heart tones 145 bpm

## 2019-01-25 NOTE — Telephone Encounter (Signed)
Reviewed [place] Substance Reporting system prior to prescribing opiate medications. No inconsistencies noted.   I did reduce morphine back down to Q12 h from q8 h. The increase was for a few months.

## 2019-01-25 NOTE — ED Notes (Signed)
Report given to Bianca, RN.  

## 2019-01-25 NOTE — Progress Notes (Addendum)
Patient transferred to [place] [facility] via carelink, report provided to [facility] & report called to Danny Lawless, RN.

## 2019-01-25 NOTE — Progress Notes (Signed)
Please place order for fetal ultra sound.    [facility] Rapid response number is [phone] please call after order has been placed, so that nurse is aware.   Thank you.

## 2019-01-25 NOTE — H&P (Signed)
Carmen [name] is a 28 y.o. female G2P0010 IUP 36 3/7 weeks presenting in transfer from [facility] d/t pregnancy and sickle cell pain crisis. Pt reports pain typical of her pain crisis for the last few days. Presented to [facility] ER last evening. Received IV pain medication with some relief but still in pain. She was admitted to hospital service and then transferred to [facility] today d/t IUP 36 3/7 weeks. Prenatal care at [facility] and complicated by Sickle cell anemia. Has been in antenatal testing with BPP 8/8 on 3/24. Last growth on 01/02/19 EFW 2066. For repeat growth scan and BPP on Monday.  She reports + FM. Denies VB, LOF or ut ctx.   OB History    Gravida  2   Para      Term      Preterm      AB  1   Living  0     SAB      TAB  1   Ectopic      Multiple      Live Births             Past Medical History:  Diagnosis Date  . Headache(784.0)   . Sickle cell crisis ([place])   . Syphilis 2015   Was diagnosed and received one injection of antibiotics  . Thrombocytosis ([place]) 11/22/2014     CBC Latest Ref Rng & Units 12/13/2018 12/11/2018 12/10/2018 WBC 4.0 - 10.5 K/uL 14.8(H) 13.2(H) 15.9(H) Hemoglobin 12.0 - 15.0 g/dL 8.2(L) 7.7(L) 8.1(L) Hematocrit 36.0 - 46.0 % 23.7(L) 23.2(L) 24.5(L) Platelets 150 - 400 K/uL 326 399 388     Past Surgical History:  Procedure Laterality Date  . CHOLECYSTECTOMY N/A 11/30/2014   Procedure: LAPAROSCOPIC CHOLECYSTECTOMY SINGLE SITE WITH INTRAOPERATIVE CHOLANGIOGRAM;  Surgeon: Michael Boston, MD;  Location: [facility] ORS;  Service: General;  Laterality: N/A;  . SPLENECTOMY     Family History: family history includes Arthritis in her sister; Heart disease in her maternal aunt and maternal uncle; Hypertension in her mother; Kidney disease in her sister; Lupus in her sister; Sickle cell anemia in her sister and sister; Sickle cell trait in her sister. Social History:  reports that she has never smoked. She has never used smokeless tobacco. She reports that she does not drink alcohol or  use drugs.      Review of Systems  Constitutional: Negative.   Respiratory: Negative.    History   Blood pressure 117/74, pulse 69, temperature 98.3 F (36.8 C), temperature source Oral, resp. rate 16, height 5\' 3"  (1.6 m), weight 77.1 kg, last menstrual period 05/15/2018, SpO2 100 %. Exam Physical Exam  Constitutional: She appears well-developed and well-nourished.  Cardiovascular: Normal rate and regular rhythm.  Respiratory: Effort normal and breath sounds normal.  GI: Soft. Bowel sounds are normal.  Gravid S=D  Genitourinary:    Genitourinary Comments: deferred     Prenatal labs: ABO, Rh: --/--/O POS (02/08 1543) Antibody: POS (02/08 1543) Rubella: 4.42 (10/17 1452) RPR: Reactive (01/29 0841)  HBsAg: Negative (10/17 1452)  HIV: Non Reactive (01/29 0841)  GBS:     Assessment/Plan: IUP 36 3/7 weeks Sickle cell anemia with crisis.  Will admitted to [facility] for management of pregnancy. Sickle cell team to management crisis and pain. NST q shift. BPP with growth U/S on Monday if pt still in house. Delivery for maternal and fetal indications.     [name] 01/25/2019, 2:33 PM

## 2019-01-25 NOTE — ED Notes (Signed)
ED TO INPATIENT HANDOFF REPORT  Name/Age/Gender Carmen [name] 28 y.o. female Room/Bed: WA15/WA15  Code Status   Code Status: Full Code  Home/SNF/Other Home Patient oriented to: self, place, time and situation Is this baseline? Yes   Triage Complete: Triage complete  Chief Complaint Sickle cell pain crisis, [redacted] weeks pregnant, Fever  Triage Note Pt reports generalized sickle cell pain in legs and arms. Reports that her doctor told her to stop taking some of her pain meds because she is pregnant and now is having pain.  Pt c/o pain to all 4 extremities & back x 4 days.  Pt stated "I called the clinic today but they told me to come to the ER".   Allergies No Known Allergies  Level of Care/Admitting Diagnosis ED Disposition    ED Disposition Condition [facility] Area: [place] [[id number]]  Level of Care: Med-Surg [16]  Diagnosis: Sickle cell pain crisis [facility]) [[id number]]  Admitting Physician: Vianne Bulls [[id number]]  Attending Physician: Vianne Bulls [[id number]]  Estimated length of stay: past midnight tomorrow  Certification:: I certify this patient will need inpatient services for at least 2 midnights  PT Class (Do Not Modify): Inpatient [101]  PT Acc Code (Do Not Modify): Private [1]       B Medical/Surgery History Past Medical History:  Diagnosis Date  . Headache(784.0)   . Sickle cell crisis ([place])   . Syphilis 2015   Was diagnosed and received one injection of antibiotics  . Thrombocytosis ([place]) 11/22/2014     CBC Latest Ref Rng & Units 12/13/2018 12/11/2018 12/10/2018 WBC 4.0 - 10.5 K/uL 14.8(H) 13.2(H) 15.9(H) Hemoglobin 12.0 - 15.0 g/dL 8.2(L) 7.7(L) 8.1(L) Hematocrit 36.0 - 46.0 % 23.7(L) 23.2(L) 24.5(L) Platelets 150 - 400 K/uL 326 399 388     Past Surgical History:  Procedure Laterality Date  . CHOLECYSTECTOMY N/A 11/30/2014   Procedure: LAPAROSCOPIC CHOLECYSTECTOMY SINGLE SITE WITH INTRAOPERATIVE CHOLANGIOGRAM;  Surgeon:  Michael Boston, MD;  Location: [facility]S;  Service: General;  Laterality: N/A;  . SPLENECTOMY       A IV Location/Drains/Wounds Patient Lines/Drains/Airways Status   Active Line/Drains/Airways    Name:   Placement date:   Placement time:   Site:   Days:   Peripheral IV 01/24/19 Left Forearm   01/24/19    2346    Forearm   1          Intake/Output Last 24 hours No intake or output data in the 24 hours ending 01/25/19 0444  Labs/Imaging Results for orders placed or performed during the hospital encounter of 01/24/19 (from the past 48 hour(s))  Comprehensive metabolic panel     Status: Abnormal   Collection Time: 01/24/19 11:35 PM  Result Value Ref Range   Sodium 137 135 - 145 mmol/L   Potassium 3.7 3.5 - 5.1 mmol/L   Chloride 110 98 - 111 mmol/L   CO2 21 (L) 22 - 32 mmol/L   Glucose, Bld 101 (H) 70 - 99 mg/dL   BUN 9 6 - 20 mg/dL   Creatinine, Ser 0.58 0.44 - 1.00 mg/dL   Calcium 8.3 (L) 8.9 - 10.3 mg/dL   Total Protein 7.0 6.5 - 8.1 g/dL   Albumin 2.5 (L) 3.5 - 5.0 g/dL   AST 40 15 - 41 U/L   ALT 21 0 - 44 U/L   Alkaline Phosphatase 400 (H) 38 - 126 U/L   Total Bilirubin 1.6 (H) 0.3 - 1.2 mg/dL  GFR calc non Af Amer >60 >60 mL/min   GFR calc Af Amer >60 >60 mL/min   Anion gap 6 5 - 15    Comment: Performed at [facility], [place] [address] [place], [place] [address]  CBC with Differential     Status: Abnormal   Collection Time: 01/24/19 11:35 PM  Result Value Ref Range   WBC 13.0 (H) 4.0 - 10.5 K/uL    Comment: WHITE COUNT CONFIRMED ON SMEAR ADJUSTED FOR NUCLEATED RBC'S    RBC 2.46 (L) 3.87 - 5.11 MIL/uL   Hemoglobin 8.1 (L) 12.0 - 15.0 g/dL   HCT 23.7 (L) 36.0 - 46.0 %   MCV 96.3 80.0 - 100.0 fL   MCH 32.9 26.0 - 34.0 pg   MCHC 34.2 30.0 - 36.0 g/dL   RDW 16.3 (H) 11.5 - 15.5 %   Platelets 344 150 - 400 K/uL   nRBC 19.7 (H) 0.0 - 0.2 %   Neutrophils Relative % 62 %   Neutro Abs 8.1 (H) 1.7 - 7.7 K/uL   Lymphocytes Relative 21 %   Lymphs Abs  2.7 0.7 - 4.0 K/uL   Monocytes Relative 13 %   Monocytes Absolute 1.7 (H) 0.1 - 1.0 K/uL   Eosinophils Relative 1 %   Eosinophils Absolute 0.2 0.0 - 0.5 K/uL   Basophils Relative 1 %   Basophils Absolute 0.1 0.0 - 0.1 K/uL   Immature Granulocytes 2 %   Abs Immature Granulocytes 0.24 (H) 0.00 - 0.07 K/uL   Tammy Sours Bodies PRESENT    Polychromasia PRESENT    Sickle Cells PRESENT    Target Cells PRESENT     Comment: Performed at [facility], [place] [address], [place], [place] [address]  Reticulocytes     Status: Abnormal   Collection Time: 01/24/19 11:35 PM  Result Value Ref Range   Retic Ct Pct 11.3 (H) 0.4 - 3.1 %   RBC. 2.46 (L) 3.87 - 5.11 MIL/uL   Retic Count, Absolute 278.7 (H) 19.0 - 186.0 K/uL   Immature Retic Fract 48.3 (H) 2.3 - 15.9 %    Comment: Performed at [facility], [place] [name] [place], [place] [address]   No results found.  Pending Labs Unresulted Labs (From admission, onward)    Start     Ordered   02/01/19 0500  Creatinine, serum  (enoxaparin (LOVENOX)    CrCl >/= 30 ml/min)  Weekly,   R    Comments:  while on enoxaparin therapy    01/25/19 0405          Vitals/Pain Today's Vitals   01/25/19 0311 01/25/19 0330 01/25/19 0400 01/25/19 0407  BP:  107/70 112/63   Pulse:  73 79   Resp:  18 18   Temp:      TempSrc:      SpO2:  98% 97%   Weight:      Height:      PainSc: 10-Worst pain ever   10-Worst pain ever    Isolation Precautions No active isolations  Medications Medications  morphine (MS CONTIN) 12 hr tablet 30 mg (has no administration in time range)  folic acid (FOLVITE) tablet 1 mg (has no administration in time range)  feeding supplement (ENSURE ENLIVE) (ENSURE ENLIVE) liquid 237 mL (has no administration in time range)  prenatal multivitamin tablet 1 tablet (has no administration in time range)  polyethylene glycol (MIRALAX / GLYCOLAX) packet 17 g (has no administration in time range)  naloxone [facility]) injection 0.4 mg (has no administration in time range)    And  sodium chloride flush (NS) 0.9 % injection 9 mL (has no administration in time range)  ondansetron (ZOFRAN) injection 4 mg (has no administration in time range)  diphenhydrAMINE (BENADRYL) capsule 25 mg (has no administration in time range)    Or  diphenhydrAMINE (BENADRYL) 25 mg in sodium chloride 0.9 % 50 mL IVPB (has no administration in time range)  enoxaparin (LOVENOX) injection 40 mg (has no administration in time range)  ondansetron (ZOFRAN) tablet 4 mg (has no administration in time range)    Or  ondansetron (ZOFRAN) injection 4 mg (has no administration in time range)  HYDROmorphone (DILAUDID) 1 mg/mL PCA injection (has no administration in time range)  0.45 % sodium chloride infusion (has no administration in time range)  sodium chloride flush (NS) 0.9 % injection 3 mL (3 mLs Intravenous Given 01/24/19 2350)  HYDROmorphone (DILAUDID) injection 2 mg (2 mg Intravenous Given 01/25/19 0003)    Or  HYDROmorphone (DILAUDID) injection 2 mg ( Subcutaneous See Alternative 01/25/19 0003)  HYDROmorphone (DILAUDID) injection 2 mg (2 mg Intravenous Given 01/25/19 0101)    Or  HYDROmorphone (DILAUDID) injection 2 mg ( Subcutaneous See Alternative 01/25/19 0101)  HYDROmorphone (DILAUDID) injection 2 mg (2 mg Intravenous Given 01/25/19 0208)    Or  HYDROmorphone (DILAUDID) injection 2 mg ( Subcutaneous See Alternative 01/25/19 0208)  HYDROmorphone (DILAUDID) injection 2 mg (2 mg Intravenous Given 01/25/19 0309)    Or  HYDROmorphone (DILAUDID) injection 2 mg ( Subcutaneous See Alternative 01/25/19 0309)  HYDROmorphone (DILAUDID) injection 2 mg (2 mg Intravenous Given 01/25/19 0416)    Mobility walks Low fall risk

## 2019-01-25 NOTE — Progress Notes (Addendum)
Carmen [name], a 28 year old female with a medical history significant for sickle cell anemia, HbSS, chronic pain syndrome, opiate dependence, and third trimester pregnancy was admitted in sickle cell pain crisis. Patient is G2P0010 at  36 weeks 3 days. Patient is complaining of severe pain to bilateral lower extremities. She state that it has been difficult to ambulate. She characterizes pain as constant and throbbing.  Pain intensity is 9/10 despite dilaudid PCA per weight based protocol.  Patient is currently afebrile and maintaining oxygen saturation at 99% on RA.   Patient denies vaginal bleeding, abnormal discharge, or abdominal cramping. She also denies shortness of breath, chest pain, or persistent cough.    Objective:  Vital signs in last 24 hours:  Vitals:   01/25/19 0519 01/25/19 0541 01/25/19 0825 01/25/19 1006  BP:  120/71  118/61  Pulse:  70  65  Resp: 14 15 14 16   Temp:  97.6 F (36.4 C)  (!) 97.5 F (36.4 C)  TempSrc:  Oral  Oral  SpO2: 98% 99% 97% 97%  Weight:      Height:        Intake/Output from previous day:   Intake/Output Summary (Last 24 hours) at 01/25/2019 1205 Last data filed at 01/25/2019 0830 Gross per 24 hour  Intake 623.74 ml  Output -  Net 623.74 ml    Physical Exam: General: Alert, awake, oriented x3, in no acute distress.  HEENT: [place]/AT PEERL, EOMI Neck: Trachea midline,  no masses, no thyromegal,y no JVD, no carotid bruit OROPHARYNX:  Moist, No exudate/ erythema/lesions.  Heart: Regular rate and rhythm, without murmurs, rubs, gallops, PMI non-displaced, no heaves or thrills on palpation.  Lungs: Clear to auscultation, no wheezing or rhonchi noted. No increased vocal fremitus resonant to percussion  Abdomen: Non tender.  Neuro: No focal neurological deficits noted cranial nerves II through XII grossly intact. DTRs 2+ bilaterally upper and lower extremities. Strength 5 out of 5 in bilateral upper and lower extremities. Musculoskeletal: No  warm swelling or erythema around joints, no spinal tenderness noted. Trace swelling to bilateral lower extremities Psychiatric: Patient alert and oriented x3, good insight and cognition, good recent to remote recall. Lymph node survey: No cervical axillary or inguinal lymphadenopathy noted.  Lab Results:  Basic Metabolic Panel:    Component Value Date/Time   NA 137 01/24/2019 2335   NA 139 08/17/2018 0935   K 3.7 01/24/2019 2335   CL 110 01/24/2019 2335   CO2 21 (L) 01/24/2019 2335   BUN 9 01/24/2019 2335   BUN 9 08/17/2018 0935   CREATININE 0.58 01/24/2019 2335   GLUCOSE 101 (H) 01/24/2019 2335   CALCIUM 8.3 (L) 01/24/2019 2335   CBC:    Component Value Date/Time   WBC 13.0 (H) 01/24/2019 2335   HGB 8.1 (L) 01/24/2019 2335   HGB 7.5 (L) 11/29/2018 0841   HCT 23.7 (L) 01/24/2019 2335   HCT 21.8 (L) 11/29/2018 0841   PLT 344 01/24/2019 2335   PLT 582 (H) 11/29/2018 0841   MCV 96.3 01/24/2019 2335   MCV 92 11/29/2018 0841   NEUTROABS 8.1 (H) 01/24/2019 2335   NEUTROABS 7.9 (H) 09/15/2018 0933   LYMPHSABS 2.7 01/24/2019 2335   LYMPHSABS 2.4 09/15/2018 0933   MONOABS 1.7 (H) 01/24/2019 2335   EOSABS 0.2 01/24/2019 2335   EOSABS 0.1 09/15/2018 0933   BASOSABS 0.1 01/24/2019 2335   BASOSABS 0.0 09/15/2018 0933    No results found for this or any previous visit (from the  past 240 hour(s)).  Studies/Results: No results found.  Medications: Scheduled Meds: . enoxaparin (LOVENOX) injection  40 mg Subcutaneous Q24H  . feeding supplement (ENSURE ENLIVE)  237 mL Oral BID  . folic acid  1 mg Oral Daily  . HYDROmorphone   Intravenous Q4H  . morphine  30 mg Oral Q8H  . prenatal multivitamin  1 tablet Oral Daily   Continuous Infusions: . sodium chloride 125 mL/hr at 01/25/19 0830  . diphenhydrAMINE     PRN Meds:.diphenhydrAMINE **OR** diphenhydrAMINE, naloxone **AND** sodium chloride flush, ondansetron (ZOFRAN) IV, ondansetron **OR** ondansetron (ZOFRAN) IV, polyethylene  glycol  Consultants:  Obstetrics/gynecology  Assessment/Plan: Principal Problem:   Sickle cell pain crisis ([place]) Active Problems:   Chronic musculoskeletal pain   Pregnancy  Third trimester pregnancy: Patient 36 weeks 3 days.  Contacted obstetrics and gynecology, recommend transfer to women's center. Will defer to obstetrics for further evaluation concerning pregnancy  Sickle cell anemia with pain crisis:  Continue 0.45 percent saline at 125 mL/h Continue weight-based Dilaudid PCA with settings of 0.6 mg, 10-minute lockout, and 4 mg/h. Monitor vital signs very closely Reevaluate pain scale regularly in the context of functioning and relationship to baseline as care progresses Maintain oxygen saturation above 90%  Chronic pain syndrome: Patient is highly opiate tolerant.  Will continue MS Contin 30 mg every 12 hours Hold oxycodone, use PCA Dilaudid as substitute.  Leukocytosis WBCs 13.0, patient afebrile.  No signs of infection.  Continue to monitor closely CBC in a.m.  Anemia of chronic disease:  Hemoglobin is 8.1, which is consistent with patient's baseline. No blood transfusion warranted at this time.  Type and screen, repeat CBC in am.   Code Status: Full Code Family Communication: N/A Disposition Plan: Not yet ready for discharge  [place], MSN, FNP-C Patient [place], [place] [address] [phone]  If 5PM-7AM, please contact night-coverage.  01/25/2019, 12:05 PM  LOS: 0 days

## 2019-01-26 LAB — CBC
HCT: 22.7 % — ABNORMAL LOW (ref 36.0–46.0)
Hemoglobin: 7.7 g/dL — ABNORMAL LOW (ref 12.0–15.0)
MCH: 31.3 pg (ref 26.0–34.0)
MCHC: 33.9 g/dL (ref 30.0–36.0)
MCV: 92.3 fL (ref 80.0–100.0)
Platelets: 323 10*3/uL (ref 150–400)
RBC: 2.46 MIL/uL — ABNORMAL LOW (ref 3.87–5.11)
RDW: 15.9 % — ABNORMAL HIGH (ref 11.5–15.5)
WBC: 13.5 10*3/uL — ABNORMAL HIGH (ref 4.0–10.5)
nRBC: 17.4 % — ABNORMAL HIGH (ref 0.0–0.2)

## 2019-01-26 LAB — GC/CHLAMYDIA PROBE AMP (~~LOC~~) NOT AT ARMC
Chlamydia: NEGATIVE
NEISSERIA GONORRHEA: NEGATIVE

## 2019-01-26 LAB — BASIC METABOLIC PANEL
Anion gap: 11 (ref 5–15)
BUN: 7 mg/dL (ref 6–20)
CO2: 24 mmol/L (ref 22–32)
Calcium: 9.4 mg/dL (ref 8.9–10.3)
Chloride: 102 mmol/L (ref 98–111)
Creatinine, Ser: 0.63 mg/dL (ref 0.44–1.00)
GFR calc Af Amer: >60 mL/min (ref >60)
GFR calc non Af Amer: >60 mL/min (ref >60)
Glucose, Bld: 96 mg/dL (ref 70–99)
Potassium: 4.4 mmol/L (ref 3.5–5.1)
Sodium: 137 mmol/L (ref 135–145)

## 2019-01-26 MED ORDER — PROMETHAZINE HCL 25 MG/ML IJ SOLN
12.5000 mg | Freq: Once | INTRAMUSCULAR | Status: AC
  Administered 2019-01-26: 12.5 mg via INTRAVENOUS
  Filled 2019-01-26: qty 1

## 2019-01-26 MED ORDER — MORPHINE SULFATE ER 30 MG PO TBCR
30.0000 mg | EXTENDED_RELEASE_TABLET | Freq: Two times a day (BID) | ORAL | Status: DC
  Administered 2019-01-26 – 2019-02-02 (×15): 30 mg via ORAL
  Filled 2019-01-26 (×15): qty 1

## 2019-01-26 MED ORDER — HYDROMORPHONE 1 MG/ML IV SOLN
INTRAVENOUS | Status: DC
  Administered 2019-01-26: 11.9 mg via INTRAVENOUS
  Administered 2019-01-26: 25 mg via INTRAVENOUS
  Administered 2019-01-26: 7.7 mg via INTRAVENOUS
  Administered 2019-01-26: 3.5 mg via INTRAVENOUS
  Administered 2019-01-26: 4.2 mg via INTRAVENOUS
  Administered 2019-01-26: 33.6 mg via INTRAVENOUS
  Administered 2019-01-27: 7 mg via INTRAVENOUS
  Administered 2019-01-27: 3.5 mg via INTRAVENOUS
  Administered 2019-01-27: 16:00:00 via INTRAVENOUS
  Administered 2019-01-27: 15.89 mg via INTRAVENOUS
  Administered 2019-01-27: 23.21 mg via INTRAVENOUS
  Administered 2019-01-27: 5.01 mg via INTRAVENOUS
  Administered 2019-01-28: 5.6 mg via INTRAVENOUS
  Administered 2019-01-28: 3.5 mg via INTRAVENOUS
  Administered 2019-01-28: 29.14 mg via INTRAVENOUS
  Administered 2019-01-28: 15.4 mg via INTRAVENOUS
  Administered 2019-01-28: 8.4 mg via INTRAVENOUS
  Administered 2019-01-28: 14.9 mg via INTRAVENOUS
  Administered 2019-01-28: 20:00:00 via INTRAVENOUS
  Administered 2019-01-28: 22.4 mg via INTRAVENOUS
  Administered 2019-01-29: 5.6 mg via INTRAVENOUS
  Administered 2019-01-29: 12.6 mg via INTRAVENOUS
  Administered 2019-01-29: 0.7 mg via INTRAVENOUS
  Administered 2019-01-29: 9.2 mg via INTRAVENOUS
  Administered 2019-01-29: 63.7 mg via INTRAVENOUS
  Administered 2019-01-29: 19:00:00 via INTRAVENOUS
  Administered 2019-01-29: 5.2 mg via INTRAVENOUS
  Administered 2019-01-30: 03:00:00 via INTRAVENOUS
  Administered 2019-01-30: 7.7 mg via INTRAVENOUS
  Administered 2019-01-30: 6.3 mg via INTRAVENOUS
  Filled 2019-01-26 (×9): qty 25

## 2019-01-26 MED ORDER — ENSURE ENLIVE PO LIQD
237.0000 mL | Freq: Two times a day (BID) | ORAL | Status: DC
  Filled 2019-01-26 (×3): qty 237

## 2019-01-26 NOTE — Progress Notes (Signed)
Patient ID: MERIT GADSBY, female   DOB: [birth year], 28 y.o.   MRN: [id number] [place] COMPREHENSIVE PROGRESS NOTE  Carmen [name] is a 28 y.o. G2P0010 at [redacted]w[redacted]d  who is admitted for Sickle cell pain crisis.   Fetal presentation is cephalic. Length of Stay:  1  Days  Subjective: Pt reports pain is a little better this morning. Patient reports good fetal movement.  She reports no uterine contractions, no bleeding and no loss of fluid per vagina.  Vitals:  Blood pressure (!) 112/58, pulse 65, temperature 97.9 F (36.6 C), temperature source Oral, resp. rate 12, height 5\' 3"  (1.6 m), weight 77.1 kg, last menstrual period 05/15/2018, SpO2 97 %.   Physical Examination: Lungs clear Heart RRR Abd soft + BS gravid Ext non tender  Fetal Monitoring:  130 baseline, + accels, good variability, no ut cxt, reactive   Labs:  Results for orders placed or performed during the hospital encounter of 01/24/19 (from the past 24 hour(s))  Type and screen [place]   Collection Time: 01/25/19  3:02 PM  Result Value Ref Range   ABO/RH(D) O POS    Antibody Screen POS    Sample Expiration 01/28/2019    DAT, IgG NEG    Antibody Identification      NO CLINICALLY SIGNIFICANT ANTIBODY IDENTIFIED Performed at [facility], [place] [address], [place], [place] [address]    Unit Number [id number]    Blood Component Type RED CELLS,LR    Unit division 00    Status of Unit ALLOCATED    Donor AG Type      NEGATIVE FOR E ANTIGEN NEGATIVE FOR KELL ANTIGEN NEGATIVE FOR S ANTIGEN   Transfusion Status OK TO TRANSFUSE    Crossmatch Result COMPATIBLE   BPAM RBC   Collection Time: 01/25/19  3:02 PM  Result Value Ref Range   Blood Product Unit Number [id number]    Unit Type and Rh 5100    Blood Product Expiration Date [id number]   Culture, beta strep (group b only)   Collection Time: 01/25/19  3:50 PM  Result Value Ref Range   Specimen Description VAGINAL/RECTAL    Special Requests NONE    Culture      PATIENT RESULTS CORRECTED WITH MIX, SUGGESTS FACTOR DEFICIENCY CULTURE REINCUBATED FOR BETTER GROWTH CORRECTED ON 03/27 AT 0814: PREVIOUSLY REPORTED AS NO GROUP B STREP (S.AGALACTIAE) ISOLATED PREVIOUSLY REPORTED AS: NO GROUP B STREP (S.AGALACTIAE) ISOLATED CORRECTED RESULTS CALLED TO: RN JEN COOK Performed at [facility], [place] [address], [place], [place] [address]    Report Status PENDING     Imaging Studies:    None   Medications:  Scheduled . docusate sodium  100 mg Oral Daily  . enoxaparin (LOVENOX) injection  40 mg Subcutaneous Q24H  . feeding supplement (ENSURE ENLIVE)  237 mL Oral BID  . folic acid  1 mg Oral Daily  . HYDROmorphone   Intravenous Q4H  . morphine  30 mg Oral Q12H  . prenatal multivitamin  1 tablet Oral Daily   I have reviewed the patient's current medications.  ASSESSMENT: IUP 36 4/7 weeks Sickle cell pain crisis   PLAN: Stable from an OB standpoint. Fetal well being reassuring. Will continue with NST's. Growth scan and BPP on Monday if pt still in house. GBS, GC/C pending. Sickle cell pain crisis management per [facility] team. Continue routine antenatal care.   [name] 01/26/2019,9:39 AM

## 2019-01-26 NOTE — Progress Notes (Signed)
Went into pt's room at 1630 to get pt on the monitor. Pt stated she had just ordered food and wanted to wait to be monitored after she ate.  RN followed up with pt at 1800 and pt was still eating. Pt stated she did not want to be put on the monitor at this time; she wants to be put on later.

## 2019-01-26 NOTE — Progress Notes (Signed)
Subjective: Carmen [name], a 28 year old female with a medical history significant for anemia, type SS, chronic pain syndrome, opiate dependence, and third trimester pregnancy was admitted and sickle pain crisis. Patient continues to complain of severe pain to bilateral lower extremities.  Current pain intensity is 9/10.  IV Dilaudid PCA continued.  Also, MS Contin 30 mg every 12 hours has been continued without interruption.    Patient is afebrile and maintaining oxygen saturation at 96% on RA. Patient denies vaginal bleeding, abnormal vaginal discharge, or abdominal cramping.  Patient reports fetal movement.  She denies shortness of breath, chest pain, or persistent cough.  Patient endorses constipation.  Objective:  Vital signs in last 24 hours:  Vitals:   01/26/19 0030 01/26/19 0450 01/26/19 0608 01/26/19 0820  BP: 125/77 (!) 113/94  (!) 112/58  Pulse: (!) 101 83  65  Resp:   13 12  Temp:    97.9 F (36.6 C)  TempSrc:    Oral  SpO2:  96% 98% 97%  Weight:      Height:        Intake/Output from previous day:   Intake/Output Summary (Last 24 hours) at 01/26/2019 1223 Last data filed at 01/26/2019 0820 Gross per 24 hour  Intake 1579.22 ml  Output 1750 ml  Net -170.78 ml    Physical Exam: General: Alert, awake, oriented x3, in no acute distress.  HEENT: [place]/AT PEERL, EOMI Neck: Trachea midline,  no masses, no thyromegal,y no JVD, no carotid bruit OROPHARYNX:  Moist, No exudate/ erythema/lesions.  Heart: Regular rate and rhythm, without murmurs, rubs, gallops, PMI non-displaced, no heaves or thrills on palpation.  Lungs: Clear to auscultation, no wheezing or rhonchi noted. No increased vocal fremitus resonant to percussion   Neuro: No focal neurological deficits noted cranial nerves II through XII grossly intact. DTRs 2+ bilaterally upper and lower extremities. Strength 5 out of 5 in bilateral upper and lower extremities. Musculoskeletal: No warm swelling or erythema around  joints, no spinal tenderness noted. Psychiatric: Patient alert and oriented x3, good insight and cognition, good recent to remote recall. Lymph node survey: No cervical axillary or inguinal lymphadenopathy noted.  Lab Results:  Basic Metabolic Panel:    Component Value Date/Time   NA 137 01/24/2019 2335   NA 139 08/17/2018 0935   K 3.7 01/24/2019 2335   CL 110 01/24/2019 2335   CO2 21 (L) 01/24/2019 2335   BUN 9 01/24/2019 2335   BUN 9 08/17/2018 0935   CREATININE 0.58 01/24/2019 2335   GLUCOSE 101 (H) 01/24/2019 2335   CALCIUM 8.3 (L) 01/24/2019 2335   CBC:    Component Value Date/Time   WBC 13.0 (H) 01/24/2019 2335   HGB 8.1 (L) 01/24/2019 2335   HGB 7.5 (L) 11/29/2018 0841   HCT 23.7 (L) 01/24/2019 2335   HCT 21.8 (L) 11/29/2018 0841   PLT 344 01/24/2019 2335   PLT 582 (H) 11/29/2018 0841   MCV 96.3 01/24/2019 2335   MCV 92 11/29/2018 0841   NEUTROABS 8.1 (H) 01/24/2019 2335   NEUTROABS 7.9 (H) 09/15/2018 0933   LYMPHSABS 2.7 01/24/2019 2335   LYMPHSABS 2.4 09/15/2018 0933   MONOABS 1.7 (H) 01/24/2019 2335   EOSABS 0.2 01/24/2019 2335   EOSABS 0.1 09/15/2018 0933   BASOSABS 0.1 01/24/2019 2335   BASOSABS 0.0 09/15/2018 0933    Recent Results (from the past 240 hour(s))  Culture, beta strep (group b only)     Status: None (Preliminary result)   Collection Time: 01/25/19  3:50 PM  Result Value Ref Range Status   Specimen Description VAGINAL/RECTAL  Final   Special Requests NONE  Final   Culture   Final    PATIENT RESULTS CORRECTED WITH MIX, SUGGESTS FACTOR DEFICIENCY CULTURE REINCUBATED FOR BETTER GROWTH CORRECTED ON 03/27 AT 0973: PREVIOUSLY REPORTED AS NO GROUP B STREP (S.AGALACTIAE) ISOLATED PREVIOUSLY REPORTED AS: NO GROUP B STREP (S.AGALACTIAE) ISOLATED CORRECTED RESULTS CALLED TO: RN JEN COOK Performed at [facility], [place] [address] [place], [place] [address]    Report Status PENDING  Incomplete    Studies/Results: No results  found.  Medications: Scheduled Meds: . docusate sodium  100 mg Oral Daily  . enoxaparin (LOVENOX) injection  40 mg Subcutaneous Q24H  . feeding supplement (ENSURE ENLIVE)  237 mL Oral BID BM  . folic acid  1 mg Oral Daily  . HYDROmorphone   Intravenous Q4H  . morphine  30 mg Oral Q12H  . prenatal multivitamin  1 tablet Oral Daily  . promethazine  12.5 mg Intravenous Once   Continuous Infusions: . diphenhydrAMINE    . lactated ringers 100 mL/hr at 01/26/19 0611   PRN Meds:.acetaminophen, calcium carbonate, diphenhydrAMINE **OR** diphenhydrAMINE, naloxone **AND** sodium chloride flush, ondansetron (ZOFRAN) IV, ondansetron **OR** ondansetron (ZOFRAN) IV, polyethylene glycol, zolpidem  Assessment/Plan: Principal Problem:   Sickle cell pain crisis ([place]) Active Problems:   Leukocytosis   Chronic musculoskeletal pain   Pregnancy   Sickle cell crisis ([place])  Third trimester pregnancy: Patient followed by obstetrics  Sickle cell anemia with pain crisis: Continue 0.45% saline at 1 to 25 mL/h Weight-based Dilaudid PCA Monitor vital signs closely Reevaluate pain scale regularly in the context of Maintain oxygen saturation above 90%  Chronic pain syndrome: Continue MS Contin 30 mg every 12 hours Hold oxycodone  Leukocytosis: CBC pending.  No signs of infection.  Continue to monitor closely  Anemia of chronic disease: Stable.  No blood transfusion.   Code Status: Full Code Family Communication: N/A Disposition Plan: Not yet ready for discharge  [place], MSN, FNP-C [name] [address] [place], [place] [address] [phone]   If 5PM-7AM, please contact night-coverage.  01/26/2019, 12:23 PM  LOS: 1 day

## 2019-01-27 LAB — CULTURE, BETA STREP (GROUP B ONLY)

## 2019-01-27 MED ORDER — ENSURE ENLIVE PO LIQD
237.0000 mL | Freq: Three times a day (TID) | ORAL | Status: DC
  Administered 2019-01-27 – 2019-02-02 (×15): 237 mL via ORAL
  Filled 2019-01-27 (×20): qty 237

## 2019-01-27 NOTE — Progress Notes (Addendum)
Patient ID: Carmen [name], female   DOB: 1991-09-23, 28 y.o.   MRN: [id number] [name] COMPREHENSIVE PROGRESS NOTE  Carmen [name] is a 28 y.o. G2P0010 at [redacted]w[redacted]d  who is admitted for sickle cell pain crisis.   Fetal presentation is cephalic. Length of Stay:  2  Days  Subjective: Pt reports feeling about the same today. Still with pain Patient reports good fetal movement.  She reports no uterine contractions, no bleeding and no loss of fluid per vagina.  Vitals:  Blood pressure 110/64, pulse 91, temperature (!) 97 F (36.1 C), temperature source Axillary, resp. rate 19, height 5\' 3"  (1.6 m), weight 77.1 kg, last menstrual period 05/15/2018, SpO2 96 %.   Physical Examination: Lungs clear Heart RRR Abd soft + BS gravid Ext no evidence of DVT  Fetal Monitoring:  120-130's baseline, + accels, good variability, no ut ctx  Labs:  Results for orders placed or performed during the hospital encounter of 01/24/19 (from the past 24 hour(s))  CBC   Collection Time: 01/26/19  7:01 PM  Result Value Ref Range   WBC 13.5 (H) 4.0 - 10.5 K/uL   RBC 2.46 (L) 3.87 - 5.11 MIL/uL   Hemoglobin 7.7 (L) 12.0 - 15.0 g/dL   HCT 22.7 (L) 36.0 - 46.0 %   MCV 92.3 80.0 - 100.0 fL   MCH 31.3 26.0 - 34.0 pg   MCHC 33.9 30.0 - 36.0 g/dL   RDW 15.9 (H) 11.5 - 15.5 %   Platelets 323 150 - 400 K/uL   nRBC 17.4 (H) 0.0 - 0.2 %  Basic metabolic panel   Collection Time: 01/26/19  7:01 PM  Result Value Ref Range   Sodium 137 135 - 145 mmol/L   Potassium 4.4 3.5 - 5.1 mmol/L   Chloride 102 98 - 111 mmol/L   CO2 24 22 - 32 mmol/L   Glucose, Bld 96 70 - 99 mg/dL   BUN 7 6 - 20 mg/dL   Creatinine, Ser 0.63 0.44 - 1.00 mg/dL   Calcium 9.4 8.9 - 10.3 mg/dL   GFR calc non Af Amer >60 >60 mL/min   GFR calc Af Amer >60 >60 mL/min   Anion gap 11 5 - 15    Imaging Studies:    NA   Medications:  Scheduled . docusate sodium  100 mg Oral Daily  . enoxaparin (LOVENOX) injection  40 mg Subcutaneous  Q24H  . feeding supplement (ENSURE ENLIVE)  237 mL Oral BID BM  . folic acid  1 mg Oral Daily  . HYDROmorphone   Intravenous Q4H  . morphine  30 mg Oral Q12H  . prenatal multivitamin  1 tablet Oral Daily   I have reviewed the patient's current medications.  ASSESSMENT: IUP 36 5/7 weeks Sickle cell anemia with pain crisis GBS +, Tx when in labor GC/C negative  PLAN: Stable from an OB standpoint. Call placed to [name] team to see pt today, as pt has not been seen by them since transfer to [facility]. Continue routine antenatal care.   [name] 01/27/2019,9:41 AM
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
## 2019-01-27 NOTE — Progress Notes (Signed)
Received a call @940  am on the [facility] [place] on call provider line [phone] this patient from Dr Arlina Robes requesting sickle cell consult. Explained that the hospitalist on call cannot be reached using this number. Advised him that the hospitalist will make rounds and to search amnion for on call sickle cell hospitalist if urgent.

## 2019-01-28 DIAGNOSIS — O99013 Anemia complicating pregnancy, third trimester: Principal | ICD-10-CM

## 2019-01-28 LAB — CBC WITH DIFFERENTIAL/PLATELET
Abs Immature Granulocytes: 0.8 10*3/uL — ABNORMAL HIGH (ref 0.00–0.07)
Band Neutrophils: 3 %
Basophils Absolute: 0 10*3/uL (ref 0.0–0.1)
Basophils Relative: 0 %
EOS ABS: 0 10*3/uL (ref 0.0–0.5)
Eosinophils Relative: 0 %
HCT: 20 % — ABNORMAL LOW (ref 36.0–46.0)
Hemoglobin: 6.9 g/dL — CL (ref 12.0–15.0)
Lymphocytes Relative: 13 %
Lymphs Abs: 2 10*3/uL (ref 0.7–4.0)
MCH: 31.8 pg (ref 26.0–34.0)
MCHC: 34.5 g/dL (ref 30.0–36.0)
MCV: 92.2 fL (ref 80.0–100.0)
MONO ABS: 1.2 10*3/uL — AB (ref 0.1–1.0)
Metamyelocytes Relative: 5 %
Monocytes Relative: 8 %
Neutro Abs: 11.4 10*3/uL — ABNORMAL HIGH (ref 1.7–7.7)
Neutrophils Relative %: 71 %
PLATELETS: 293 10*3/uL (ref 150–400)
RBC: 2.17 MIL/uL — ABNORMAL LOW (ref 3.87–5.11)
RDW: 16.3 % — ABNORMAL HIGH (ref 11.5–15.5)
WBC: 15.4 10*3/uL — ABNORMAL HIGH (ref 4.0–10.5)
nRBC: 16.1 % — ABNORMAL HIGH (ref 0.0–0.2)
nRBC: 26 /100 WBC — ABNORMAL HIGH

## 2019-01-28 LAB — COMPREHENSIVE METABOLIC PANEL
ALT: 25 U/L (ref 0–44)
AST: 52 U/L — ABNORMAL HIGH (ref 15–41)
Albumin: 2.3 g/dL — ABNORMAL LOW (ref 3.5–5.0)
Alkaline Phosphatase: 363 U/L — ABNORMAL HIGH (ref 38–126)
Anion gap: 6 (ref 5–15)
BUN: 9 mg/dL (ref 6–20)
CO2: 24 mmol/L (ref 22–32)
Calcium: 8.6 mg/dL — ABNORMAL LOW (ref 8.9–10.3)
Chloride: 104 mmol/L (ref 98–111)
Creatinine, Ser: 0.59 mg/dL (ref 0.44–1.00)
GFR calc Af Amer: >60 mL/min (ref >60)
GFR calc non Af Amer: >60 mL/min (ref >60)
Glucose, Bld: 87 mg/dL (ref 70–99)
POTASSIUM: 4 mmol/L (ref 3.5–5.1)
Sodium: 134 mmol/L — ABNORMAL LOW (ref 135–145)
Total Bilirubin: 2.4 mg/dL — ABNORMAL HIGH (ref 0.3–1.2)
Total Protein: 6.4 g/dL — ABNORMAL LOW (ref 6.5–8.1)

## 2019-01-28 LAB — PREPARE RBC (CROSSMATCH)

## 2019-01-28 MED ORDER — SODIUM CHLORIDE 0.9% IV SOLUTION
Freq: Once | INTRAVENOUS | Status: AC
  Administered 2019-01-28: 12:00:00 via INTRAVENOUS
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
## 2019-01-28 NOTE — Progress Notes (Addendum)
[place]) NOTE  Carmen [name] is a 28 y.o. G2P0010 at [redacted]w[redacted]d  who is admitted for sickle cell crisis, admitted to [facility] 3/25, transferred to [facility] on 3/26.   Fetal presentation is cephalic. Length of Stay:  3  Days  Subjective:pt still rates pain a 8/10. Was 10/10 on admit.  Was 5/10 on d/c last admission per Dr Leontine Locket note Notified of HGB of 6.9, will transfuse 1 unit Patient reports the fetal movement as active. Patient reports uterine contraction  activity as none. Patient reports  vaginal bleeding as none. Patient describes fluid per vagina as None.  Vitals:  Blood pressure (!) 101/52, pulse 79, temperature 98.3 F (36.8 C), temperature source Oral, resp. rate 16, height 5\' 3"  (1.6 m), weight 77.1 kg, last menstrual period 05/15/2018, SpO2 98 %. Physical Examination:  General appearance - alert, well appearing, and in no distress, oriented to person, place, and time and normal appearing weight Heart - normal rate and regular rhythm Abdomen - soft, nontender, nondistended Fundal Height:  size equals dates Cervical Exam: Not evaluated.  fetal presentation is cephalic. Extremities: extremities normal, atraumatic, no cyanosis or edema and Homans sign is negative, no sign of DVT with DTRs 2+ bilaterally Membranes:intact  Fetal Monitoring:  Baseline: 135 bpm, Variability: Good {> 6 bpm), Accelerations: Reactive and Decelerations: Absent  Labs:  Results for orders placed or performed during the hospital encounter of 01/24/19 (from the past 24 hour(s))  CBC with Differential/Platelet   Collection Time: 01/28/19  9:21 AM  Result Value Ref Range   WBC 15.4 (H) 4.0 - 10.5 K/uL   RBC 2.17 (L) 3.87 - 5.11 MIL/uL   Hemoglobin 6.9 (LL) 12.0 - 15.0 g/dL   HCT 20.0 (L) 36.0 - 46.0 %   MCV 92.2 80.0 - 100.0 fL   MCH 31.8 26.0 - 34.0 pg   MCHC 34.5 30.0 - 36.0 g/dL   RDW 16.3 (H) 11.5 - 15.5 %   Platelets 293 150 - 400 K/uL   nRBC 16.1 (H) 0.0  - 0.2 %   Neutrophils Relative % 71 %   Neutro Abs 11.4 (H) 1.7 - 7.7 K/uL   Band Neutrophils 3 %   Lymphocytes Relative 13 %   Lymphs Abs 2.0 0.7 - 4.0 K/uL   Monocytes Relative 8 %   Monocytes Absolute 1.2 (H) 0.1 - 1.0 K/uL   Eosinophils Relative 0 %   Eosinophils Absolute 0.0 0.0 - 0.5 K/uL   Basophils Relative 0 %   Basophils Absolute 0.0 0.0 - 0.1 K/uL   nRBC 26 (H) 0 /100 WBC   Metamyelocytes Relative 5 %   Abs Immature Granulocytes 0.80 (H) 0.00 - 0.07 K/uL   Tear Drop Cells PRESENT    Pappenheimer Bodies PRESENT    Polychromasia PRESENT    Sickle Cells PRESENT    Target Cells PRESENT   Comprehensive metabolic panel   Collection Time: 01/28/19  9:21 AM  Result Value Ref Range   Sodium 134 (L) 135 - 145 mmol/L   Potassium 4.0 3.5 - 5.1 mmol/L   Chloride 104 98 - 111 mmol/L   CO2 24 22 - 32 mmol/L   Glucose, Bld 87 70 - 99 mg/dL   BUN 9 6 - 20 mg/dL   Creatinine, Ser 0.59 0.44 - 1.00 mg/dL   Calcium 8.6 (L) 8.9 - 10.3 mg/dL   Total Protein 6.4 (L) 6.5 - 8.1 g/dL   Albumin 2.3 (L) 3.5 - 5.0 g/dL  AST 52 (H) 15 - 41 U/L   ALT 25 0 - 44 U/L   Alkaline Phosphatase 363 (H) 38 - 126 U/L   Total Bilirubin 2.4 (H) 0.3 - 1.2 mg/dL   GFR calc non Af Amer >60 >60 mL/min   GFR calc Af Amer >60 >60 mL/min   Anion gap 6 5 - 15    Imaging Studies:      . sodium chloride   Intravenous Once  . docusate sodium  100 mg Oral Daily  . enoxaparin (LOVENOX) injection  40 mg Subcutaneous Q24H  . feeding supplement (ENSURE ENLIVE)  237 mL Oral TID  . folic acid  1 mg Oral Daily  . HYDROmorphone   Intravenous Q4H  . morphine  30 mg Oral Q12H  . prenatal multivitamin  1 tablet Oral Daily   I have reviewed the patient's current medications.  ASSESSMENT: Patient Active Problem List   Diagnosis Date Noted  . Supervision of high risk pregnancy, antepartum 08/17/2018    Priority: Low  . Pregnancy 01/25/2019  . Sickle cell crisis ([place]) 01/25/2019  . Sickle cell pain crisis ([place])  12/04/2018  . Alpha thalassemia silent carrier 12/04/2018  . 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 Pain in extremity   . Chronic prescription opiate use 03/13/2018  . Chronic musculoskeletal pain 03/13/2018  . Vitamin D deficiency 01/13/2018  . Leukocytosis 08/21/2017  . 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 Hb-SS disease without crisis ([place]) 08/15/2016  . Anemia of chronic disease   . Systolic ejection murmur 07/30/5746  . Sickle cell anemia of mother during pregnancy ([place]) 05/16/2014    PLAN: Transfuse 1 unit prbc. BPP scheduled for tomorrow. Resume outpt care when pain control adequate.   Jonnie Kind 01/28/2019,10:57 AM    Patient ID: Carmen [name], female   DOB: June 01, 1991, 28 y.o.   MRN: [id number]

## 2019-01-28 NOTE — Progress Notes (Signed)
Subjective: Patient is a28 year old female admitted with sickle cell painful crisis who is also in third trimester pregnancy.  Patient has significant pain at 10 out of 10.  She is currently on Dilaudid PCA.  Patient has used 31 mg in the last 24 hours.  Patient also complaining of lower extremity edema.  Denied any shortness of breath.  Denied any nausea vomiting or diarrhea  Objective: Vital signs in last 24 hours: Blood pressure 110/64, pulse 91, temperature (!) 97 F (36.1 C), temperature source Axillary, resp. rate 19, height 5\' 3"  (1.6 m), weight 77.1 kg, last menstrual period 05/15/2018, SpO2 96 %.  Weight change:  Last BM Date: 01/26/19  Intake/Output from previous day: No intake/output data recorded. Intake/Output this shift: No intake/output data recorded.  General appearance: alert, cooperative and no distress Head: Normocephalic, without obvious abnormality, atraumatic Neck: no adenopathy, no carotid bruit, no JVD, supple, symmetrical, trachea midline and thyroid not enlarged, symmetric, no tenderness/mass/nodules Back: symmetric, no curvature. ROM normal. No CVA tenderness. Resp: clear to auscultation bilaterally Cardio: regular rate and rhythm, S1, S2 normal, no murmur, click, rub or gallop GI: soft, non-tender; bowel sounds normal; no masses,  no organomegaly Extremities: extremities normal, atraumatic, no cyanosis or edema Pulses: 2+ and symmetric Neurologic: Grossly normal  Lab Results:   01/26/19 1901  WBC 13.5*  HGB 7.7*  HCT 22.7*  PLT 323   BMET   01/26/19 1901  NA 137  K 4.4  CL 102  CO2 24  GLUCOSE 96  BUN 7  CREATININE 0.63  CALCIUM 9.4    Studies/Results: No results found.  Medications: I have reviewed the patient's current medications.  Assessment/Plan: A 28 year old admitted with sickle cell painful crisis.  Patient is still having significant pain.  Hemoglobin is dropped some to less than 7 g.  #1 sickle cell painful crisis: Slowly  improving.  Patient encouraged to use more Dilaudid PCA.  Continue MS Contin and reassess in the morning.    #2 sickle cell anemia: We will recheck hemoglobin in the morning.  Currently at 7.7.  #3 third trimester of pregnancy: Continue care per OB/GYN.  #4  chronic pain syndrome: Continue MS Contin with ongoing Dilaudid.  Continue treatment.  #5 leukocytosis: Due to sickle cell crisis.  Appears much better control.  Continue treatment.   LOS: 2 days   Ommie Degeorge,LAWAL 01/27/2019, 10:24 PM

## 2019-01-28 NOTE — Progress Notes (Signed)
CRITICAL VALUE ALERT  Critical Value:  Hgb- 6.9  Date & Time Notied:  01/28/2019  Provider Notified: Dr. Glo Herring  Orders Received/Actions taken: new orders received

## 2019-01-29 ENCOUNTER — Ambulatory Visit (HOSPITAL_COMMUNITY): Payer: Medicaid Other | Attending: Obstetrics and Gynecology

## 2019-01-29 ENCOUNTER — Ambulatory Visit (HOSPITAL_COMMUNITY): Payer: Medicaid Other

## 2019-01-29 ENCOUNTER — Inpatient Hospital Stay (HOSPITAL_BASED_OUTPATIENT_CLINIC_OR_DEPARTMENT_OTHER): Payer: Medicaid Other

## 2019-01-29 DIAGNOSIS — O99013 Anemia complicating pregnancy, third trimester: Secondary | ICD-10-CM | POA: Diagnosis not present

## 2019-01-29 DIAGNOSIS — Z362 Encounter for other antenatal screening follow-up: Secondary | ICD-10-CM | POA: Diagnosis not present

## 2019-01-29 DIAGNOSIS — D571 Sickle-cell disease without crisis: Secondary | ICD-10-CM

## 2019-01-29 DIAGNOSIS — O99323 Drug use complicating pregnancy, third trimester: Secondary | ICD-10-CM

## 2019-01-29 DIAGNOSIS — Z3A37 37 weeks gestation of pregnancy: Secondary | ICD-10-CM

## 2019-01-29 LAB — CBC
HCT: 22.9 % — ABNORMAL LOW (ref 36.0–46.0)
Hemoglobin: 8.5 g/dL — ABNORMAL LOW (ref 12.0–15.0)
MCH: 33.3 pg (ref 26.0–34.0)
MCHC: 37.1 g/dL — AB (ref 30.0–36.0)
MCV: 89.8 fL (ref 80.0–100.0)
Platelets: 354 10*3/uL (ref 150–400)
RBC: 2.55 MIL/uL — ABNORMAL LOW (ref 3.87–5.11)
RDW: 17.1 % — ABNORMAL HIGH (ref 11.5–15.5)
WBC: 15.2 10*3/uL — ABNORMAL HIGH (ref 4.0–10.5)
nRBC: 19.5 % — ABNORMAL HIGH (ref 0.0–0.2)

## 2019-01-29 LAB — BPAM RBC
Blood Product Expiration Date: 202004072359
Blood Product Expiration Date: 202005012359
ISSUE DATE / TIME: 202003291241
Unit Type and Rh: 5100
Unit Type and Rh: 5100

## 2019-01-29 LAB — TYPE AND SCREEN
ABO/RH(D): O POS
Antibody Screen: POSITIVE
DAT, IgG: NEGATIVE
UNIT DIVISION: 0
Unit division: 0

## 2019-01-29 MED ORDER — SODIUM CHLORIDE 0.9% FLUSH
3.0000 mL | Freq: Two times a day (BID) | INTRAVENOUS | Status: DC
  Administered 2019-01-30 – 2019-01-31 (×2): 3 mL via INTRAVENOUS

## 2019-01-29 NOTE — Progress Notes (Signed)
PT refusing EFM, states she wants to nap. Rn suggested monitoring baby through nap, patient refused.  Patient states she will notify RN when ready for monitoring.

## 2019-01-29 NOTE — Plan of Care (Signed)
  Problem: Pain Managment: Goal: General experience of comfort will improve Outcome: Progressing   

## 2019-01-29 NOTE — Progress Notes (Signed)
Subjective: Carmen [name], a 28 year old female with a medical history significant for sickle cell anemia type SS, chronic pain syndrome, opiate dependence and third trimester pregnancy was admitted in sickle cell pain crisis.  Patient continues to complain of severe pain to bilateral lower extremities.  She also endorses swelling.  Current pain intensity is 8-9/10.  Patient's hemoglobin is 8.5, she received 1 unit of packed red blood cells on 01/28/2019. Patient is afebrile and maintaining oxygen saturation at 98% on RA.  Patient denies vaginal bleeding, abnormal vaginal discharge, or abdominal cramping.  She reports fetal movement.  She denies shortness of breath, chest pains, persistent cough, nausea, vomiting, or diarrhea.  Objective:  Vital signs in last 24 hours:  Vitals:   01/29/19 0845 01/29/19 0940 01/29/19 1147 01/29/19 1251  BP:  135/83 (!) 116/58   Pulse:  88 93   Resp: 16 18 18 14   Temp:  98.3 F (36.8 C) 98.1 F (36.7 C)   TempSrc:  Oral Oral   SpO2: 98% 98% 99%   Weight:      Height:        Intake/Output from previous day:   Intake/Output Summary (Last 24 hours) at 01/29/2019 1509 Last data filed at 01/29/2019 1452 Gross per 24 hour  Intake 3351.52 ml  Output 600 ml  Net 2751.52 ml    Physical Exam: General: Alert, awake, oriented x3, in no acute distress.  HEENT: [place]/AT PEERL, EOMI Neck: Trachea midline,  no masses, no thyromegal,y no JVD, no carotid bruit OROPHARYNX:  Moist, No exudate/ erythema/lesions.  Heart: Regular rate and rhythm, without murmurs, rubs, gallops, PMI non-displaced, no heaves or thrills on palpation.  Lungs: Clear to auscultation, no wheezing or rhonchi noted. No increased vocal fremitus resonant to percussion  Abdomen: Soft, nontender, nondistended, positive bowel sounds, no masses no hepatosplenomegaly noted..  Neuro: No focal neurological deficits noted cranial nerves II through XII grossly intact. DTRs 2+ bilaterally upper and lower  extremities. Strength 5 out of 5 in bilateral upper and lower extremities. Musculoskeletal: No warm swelling or erythema around joints, no spinal tenderness noted. Psychiatric: Patient alert and oriented x3, good insight and cognition, good recent to remote recall. Lymph node survey: No cervical axillary or inguinal lymphadenopathy noted.  Lab Results:  Basic Metabolic Panel:    Component Value Date/Time   NA 134 (L) 01/28/2019 0921   NA 139 08/17/2018 0935   K 4.0 01/28/2019 0921   CL 104 01/28/2019 0921   CO2 24 01/28/2019 0921   BUN 9 01/28/2019 0921   BUN 9 08/17/2018 0935   CREATININE 0.59 01/28/2019 0921   GLUCOSE 87 01/28/2019 0921   CALCIUM 8.6 (L) 01/28/2019 0921   CBC:    Component Value Date/Time   WBC 15.2 (H) 01/29/2019 0519   HGB 8.5 (L) 01/29/2019 0519   HGB 7.5 (L) 11/29/2018 0841   HCT 22.9 (L) 01/29/2019 0519   HCT 21.8 (L) 11/29/2018 0841   PLT 354 01/29/2019 0519   PLT 582 (H) 11/29/2018 0841   MCV 89.8 01/29/2019 0519   MCV 92 11/29/2018 0841   NEUTROABS 11.4 (H) 01/28/2019 0921   NEUTROABS 7.9 (H) 09/15/2018 0933   LYMPHSABS 2.0 01/28/2019 0921   LYMPHSABS 2.4 09/15/2018 0933   MONOABS 1.2 (H) 01/28/2019 0921   EOSABS 0.0 01/28/2019 0921   EOSABS 0.1 09/15/2018 0933   BASOSABS 0.0 01/28/2019 0921   BASOSABS 0.0 09/15/2018 0933    Recent Results (from the past 240 hour(s))  Culture, beta strep (group  b only)     Status: None   Collection Time: 01/25/19  3:50 PM  Result Value Ref Range Status   Specimen Description VAGINAL/RECTAL  Final   Special Requests NONE  Final   Culture   Final    PATIENT RESULTS CORRECTED WITH MIX, SUGGESTS FACTOR DEFICIENCY CULTURE REINCUBATED FOR BETTER GROWTH CORRECTED ON 03/27 AT 8315: PREVIOUSLY REPORTED AS NO GROUP B STREP (S.AGALACTIAE) ISOLATED PREVIOUSLY REPORTED AS: NO GROUP B STREP (S.AGALACTIAE) ISOLATED CORRECTED RESULTS CALLED TO: RN JEN COOK GROUP B STREP(S.AGALACTIAE)ISOLATED TESTING AGAINST S.  AGALACTIAE NOT ROUTINELY PERFORMED DUE TO PREDICTABILITY OF AMP/PEN/VAN SUSCEPTIBILITY. CRITICAL RESULT CALLED TO, READ BACK BY AND VERIFIED WITH: RN F MORRIS [phone] AT 750 BY CM Performed at [facility], [place] [address], [place], [place] [address]    Report Status 01/27/2019 FINAL  Final    Studies/Results: US Fetal Bpp Wo Non Stress  Result Date: 01/29/2019 ----------------------------------------------------------------------  OBSTETRICS REPORT                         (Signed Final 01/29/2019 10:49 am) ---------------------------------------------------------------------- Patient Info  ID #:        [id number]                          D.O.B.:  09-06-1991 (28 yrs)  Name:        Carmen [name]                  Visit Date: 01/29/2019 08:15 am ---------------------------------------------------------------------- Performed By  Performed By:      Corky Crafts             Ref. Address:      [facility]                                                               [place], [place]  [place]  Attending:         Sander Nephew      Location:          [facility]                     [facility]                                        [place]  Referred By:       [facility] for [place] ---------------------------------------------------------------------- Orders   #  Description                           Code         Ordered By   1  [place] MFM OB FOLLOW UP                   [id number]     MICHAEL ERVIN   2  US FETAL BPP WO NON STRESS            76819.0      MICHAEL ERVIN  ----------------------------------------------------------------------   #  Order #                      Accession #                 Episode #   1  [id number]                   [id number]                  [id number]   2  [id number]                   [id number]                  [id number]  ---------------------------------------------------------------------- Indications   Encounter for other antenatal screening         Z36.2   follow-up (low risk NIPS, AFP- neg)   [redacted] weeks gestation of pregnancy                 Z3A.37   Maternal sickle cell anemia, third trimester    O99.013, W25.8   Drug use complicating pregnancy, third          O99.323   trimester (chronic opoid abuse)  ---------------------------------------------------------------------- Vital Signs  Weight (lb):  170                               Height:        5'3"  BMI:          30.11 ---------------------------------------------------------------------- Fetal Evaluation  Num Of Fetuses:          1  Fetal Heart Rate(bpm):   132  Cardiac Activity:        Observed  Presentation:            Cephalic  Placenta:  Posterior  P. Cord Insertion:       Previously Visualized  Amniotic Fluid  AFI FV:      Within normal limits  AFI Sum(cm)     %Tile       Largest Pocket(cm)  15.49           58          4.72  RUQ(cm)       RLQ(cm)        LUQ(cm)        LLQ(cm)  4.72          4              4.17           2.6 ---------------------------------------------------------------------- Biophysical Evaluation  Amniotic F.V:   Within normal limits        F. Tone:         Observed  F. Movement:    Observed                    Score:           8/8  F. Breathing:   Observed ---------------------------------------------------------------------- Biometry  BPD:      89.2   mm     G. Age:  36w 1d         39  %    CI:         77.26  %    70 - 86                                                           FL/HC:       20.6  %    20.8 - 22.6  HC:      321.3   mm     G. Age:  36w 2d         12  %    HC/AC:       1.05       0.92 - 1.05  AC:      307.4   mm     G. Age:  34w 5d          9   %    FL/BPD:      74.1  %    71 - 87  FL:       66.1   mm     G. Age:  34w 0d        < 3  %    FL/AC:       21.5  %    20 - 24  HUM:      57.4   mm     G. Age:  33w 2d        < 5  %  Est. FW:    2522   gm      5 lb 9 oz     22  % ---------------------------------------------------------------------- OB History  Gravidity:     2  TOP:           1 ---------------------------------------------------------------------- Gestational Age  LMP:            37w 0d       Date:  05/15/18  EDD:  02/19/19  U/S Today:      35w 2d                                         EDD:  03/03/19  Best:           37w 0d    Det. By:  LMP  (05/15/18)            EDD:  02/19/19 ---------------------------------------------------------------------- Anatomy  Cranium:                Appears normal         LVOT:                   Previously seen  Cavum:                  Previously seen        Aortic Arch:            Previously seen  Ventricles:             Previously seen        Ductal Arch:            Previously seen  Choroid Plexus:         Previously seen        Diaphragm:              Previously seen  Cerebellum:             Previously seen        Stomach:                Appears normal, left                                                                         sided  Posterior Fossa:        Previously seen        Abdomen:                Previously seen  Nuchal Fold:            Previously seen        Abdominal Wall:         Previously seen  Face:                   Orbits and profile     Cord Vessels:           Previously seen                          previously seen  Lips:                   Previously seen        Kidneys:                Appear normal  Palate:                 Previously seen        Bladder:  Appears normal  Thoracic:               Appears normal         Spine:                  Previously seen  Heart:                  Previously seen        Upper Extremities:      Previously seen  RVOT:                    Previously seen        Lower Extremities:      Previously seen  Other:   Fetus appears to be a female prev seen. Heels and 5th digit visualized           previously. Open hands visualized previously. Nasal bone visualized           previously. ---------------------------------------------------------------------- Cervix Uterus Adnexa  Cervix  Not visualized (advanced GA >24wks) ---------------------------------------------------------------------- Impression  Normal interval growth.  Biophysical profile 8/8  SCD ---------------------------------------------------------------------- Recommendations  Continue weekly testing with delivery between 39-40 weeks. ----------------------------------------------------------------------               Sander Nephew, MD Electronically Signed Final Report   01/29/2019 10:49 am ----------------------------------------------------------------------  [place] Mfm Ob Follow Up  Result Date: 01/29/2019 ----------------------------------------------------------------------  OBSTETRICS REPORT                         (Signed Final 01/29/2019 10:49 am) ---------------------------------------------------------------------- Patient Info  ID #:        [id number]                          D.O.B.:  1991-01-07 (28 yrs)  Name:        LETTICIA BHATTACHARYYA Cadiente                  Visit Date: 01/29/2019 08:15 am ---------------------------------------------------------------------- Performed By  Performed By:      Corky Crafts             Ref. Address:      [facility]                                                               [place]  [place], [place]  Attending:         Sander Nephew      Location:          [facility] and                      MD                                        [place]  Referred By:       [facility] for [place] ---------------------------------------------------------------------- Orders   #  Description                           Code         Ordered By   1  [place] MFM OB FOLLOW UP                   [id number]     MICHAEL ERVIN   2  US FETAL BPP WO NON STRESS            76819.0      MICHAEL ERVIN  ----------------------------------------------------------------------   #  Order #                     Accession #                 Episode #   1  [id number]                   [id number]                  [id number]   2  [id number]                   [id number]                  [id number]  ---------------------------------------------------------------------- Indications   Encounter for other antenatal screening         Z36.2   follow-up (low risk NIPS, AFP- neg)   [redacted] weeks gestation of pregnancy                 Z3A.37   Maternal sickle cell anemia, third trimester    O99.013, C78.9   Drug use complicating pregnancy, third          O99.323   trimester (chronic opoid abuse)  ---------------------------------------------------------------------- Vital Signs  Weight (lb):  170                               Height:        5'3"  BMI:          30.11 ---------------------------------------------------------------------- Fetal  Evaluation  Num Of Fetuses:          1  Fetal Heart Rate(bpm):   132  Cardiac Activity:        Observed  Presentation:            Cephalic  Placenta:                Posterior  P. Cord Insertion:       Previously Visualized  Amniotic Fluid  AFI FV:      Within normal limits  AFI Sum(cm)     %Tile       Largest Pocket(cm)  15.49           58          4.72  RUQ(cm)       RLQ(cm)        LUQ(cm)        LLQ(cm)  4.72          4              4.17           2.6 ---------------------------------------------------------------------- Biophysical Evaluation   Amniotic F.V:   Within normal limits        F. Tone:         Observed  F. Movement:    Observed                    Score:           8/8  F. Breathing:   Observed ---------------------------------------------------------------------- Biometry  BPD:      89.2   mm     G. Age:  36w 1d         39  %    CI:         77.26  %    70 - 86                                                           FL/HC:       20.6  %    20.8 - 22.6  HC:      321.3   mm     G. Age:  36w 2d         12  %    HC/AC:       1.05       0.92 - 1.05  AC:      307.4   mm     G. Age:  34w 5d          9  %    FL/BPD:      74.1  %    71 - 87  FL:       66.1   mm     G. Age:  34w 0d        < 3  %    FL/AC:       21.5  %    20 - 24  HUM:      57.4   mm     G. Age:  33w 2d        < 5  %  Est. FW:    2522   gm      5 lb 9 oz  22  % ---------------------------------------------------------------------- OB History  Gravidity:     2  TOP:           1 ---------------------------------------------------------------------- Gestational Age  LMP:            37w 0d       Date:  05/15/18                   EDD:  02/19/19  U/S Today:      35w 2d                                         EDD:  03/03/19  Best:           37w 0d    Det. By:  LMP  (05/15/18)            EDD:  02/19/19 ---------------------------------------------------------------------- Anatomy  Cranium:                Appears normal         LVOT:                   Previously seen  Cavum:                  Previously seen        Aortic Arch:            Previously seen  Ventricles:             Previously seen        Ductal Arch:            Previously seen  Choroid Plexus:         Previously seen        Diaphragm:              Previously seen  Cerebellum:             Previously seen        Stomach:                Appears normal, left                                                                         sided  Posterior Fossa:        Previously seen        Abdomen:                Previously seen  Nuchal Fold:             Previously seen        Abdominal Wall:         Previously seen  Face:                   Orbits and profile     Cord Vessels:           Previously seen                          previously seen  Lips:  Previously seen        Kidneys:                Appear normal  Palate:                 Previously seen        Bladder:                Appears normal  Thoracic:               Appears normal         Spine:                  Previously seen  Heart:                  Previously seen        Upper Extremities:      Previously seen  RVOT:                   Previously seen        Lower Extremities:      Previously seen  Other:   Fetus appears to be a female prev seen. Heels and 5th digit visualized           previously. Open hands visualized previously. Nasal bone visualized           previously. ---------------------------------------------------------------------- Cervix Uterus Adnexa  Cervix  Not visualized (advanced GA >24wks) ---------------------------------------------------------------------- Impression  Normal interval growth.  Biophysical profile 8/8  SCD ---------------------------------------------------------------------- Recommendations  Continue weekly testing with delivery between 39-40 weeks. ----------------------------------------------------------------------               Sander Nephew, MD Electronically Signed Final Report   01/29/2019 10:49 am ----------------------------------------------------------------------   Medications: Scheduled Meds: . docusate sodium  100 mg Oral Daily  . enoxaparin (LOVENOX) injection  40 mg Subcutaneous Q24H  . feeding supplement (ENSURE ENLIVE)  237 mL Oral TID  . folic acid  1 mg Oral Daily  . HYDROmorphone   Intravenous Q4H  . morphine  30 mg Oral Q12H  . prenatal multivitamin  1 tablet Oral Daily   Continuous Infusions: . diphenhydrAMINE    . lactated ringers 100 mL/hr at 01/29/19 1334   PRN Meds:.acetaminophen, calcium  carbonate, diphenhydrAMINE **OR** diphenhydrAMINE, naloxone **AND** sodium chloride flush, ondansetron (ZOFRAN) IV, ondansetron **OR** ondansetron (ZOFRAN) IV, polyethylene glycol, zolpidem   Assessment/Plan: Principal Problem:   Sickle cell pain crisis ([facility]) Active Problems:   Leukocytosis   Chronic musculoskeletal pain   Pregnancy   Sickle cell crisis (H[facility])  Sickle cell anemia with pain crisis: Continue [facility] at [facility] Continue weight-based Dilaudid PCA Monitor vital signs closely Reevaluate pain scale regularly Maintain oxygen saturation above 90%  Chronic pain syndrome: Continue MS Contin 30 mg every 12 hours  Leukocytosis: WBCs 15.4, patient afebrile, no signs of infection.  Anemia of chronic disease: Hemoglobin 8.5, which is improved from previous. Patient is s/p 1 unit packed red blood cells. Continue to follow CBC  Third trimester pregnancy: Continue care per OB/GYN  Code Status: Full Code Family Communication: N/A Disposition Plan: Not yet ready for discharge   [place], MSN, FNP-C Patient [place], [place] [address] [phone]   If 5PM-7AM, please contact night-coverage.  01/29/2019, 3:09 PM  LOS: 4 days

## 2019-01-29 NOTE — Progress Notes (Signed)
I went in to place pt on EFM and informed pt that I needed to place the continuous pulse ox on her finger.  She asked me why I needed to do that since she hasn't been wearing it during her entire admission and I started to explain the PCA policy to her. She immediately got defensive and asked who the charge nurse was so she could have a new nurse. I informed the [facility] of the situation and had her come to the patient's room to talk.

## 2019-01-29 NOTE — Progress Notes (Signed)
Patient ID: Carmen [name], female   DOB: [birth year], 28 y.o.   MRN: [id number] [place]) NOTE  Carmen [name] is a 28 y.o. G2P0010 at [redacted]w[redacted]d by best clinical estimate who is admitted for sickle cell crisis.   Fetal presentation is cephalic. Length of Stay:  4  Days  Subjective: Pain slightly improved, s/p transfusion yesterday Patient reports the fetal movement as active. Patient reports uterine contraction  activity as none. Patient reports  vaginal bleeding as none. Patient describes fluid per vagina as None.  Vitals:  Blood pressure 135/83, pulse 88, temperature 98.3 F (36.8 C), temperature source Oral, resp. rate 18, height 5\' 3"  (1.6 m), weight 77.1 kg, last menstrual period 05/15/2018, SpO2 98 %. Physical Examination:  General appearance - alert, well appearing, and in no distress Heart - normal rate and regular rhythm Abdomen - soft, nontender, nondistended Fundal Height:  size equals dates Cervical Exam: Not evaluated.  Extremities: extremities normal, atraumatic, no cyanosis or edema and Homans sign is negative, no sign of DVT with DTRs 2+ bilaterally Membranes:intact  Fetal Monitoring:  Baseline: 135 bpm, Variability: Good {> 6 bpm), Accelerations: Reactive and Decelerations: Absent  Labs:  Results for orders placed or performed during the hospital encounter of 01/24/19 (from the past 24 hour(s))  Prepare RBC   Collection Time: 01/28/19 11:25 AM  Result Value Ref Range   Order Confirmation      ORDER PROCESSED BY BLOOD BANK Performed at [facility], [address], [place], [place] [address]   CBC   Collection Time: 01/29/19  5:19 AM  Result Value Ref Range   WBC 15.2 (H) 4.0 - 10.5 K/uL   RBC 2.55 (L) 3.87 - 5.11 MIL/uL   Hemoglobin 8.5 (L) 12.0 - 15.0 g/dL   HCT 22.9 (L) 36.0 - 46.0 %   MCV 89.8 80.0 - 100.0 fL   MCH 33.3 26.0 - 34.0 pg   MCHC 37.1 (H) 30.0 - 36.0 g/dL   RDW 17.1 (H) 11.5 - 15.5 %   Platelets 354 150 -  400 K/uL   nRBC 19.5 (H) 0.0 - 0.2 %     Medications:  Scheduled . docusate sodium  100 mg Oral Daily  . enoxaparin (LOVENOX) injection  40 mg Subcutaneous Q24H  . feeding supplement (ENSURE ENLIVE)  237 mL Oral TID  . folic acid  1 mg Oral Daily  . HYDROmorphone   Intravenous Q4H  . morphine  30 mg Oral Q12H  . prenatal multivitamin  1 tablet Oral Daily   I have reviewed the patient's current medications.  ASSESSMENT: Patient Active Problem List   Diagnosis Date Noted  . Pregnancy 01/25/2019  . Sickle cell crisis ([place]) 01/25/2019  . Sickle cell pain crisis ([place]) 12/04/2018  . Alpha thalassemia silent carrier 12/04/2018  . 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 Pain in extremity   . Supervision of high risk pregnancy, antepartum 08/17/2018  . Chronic prescription opiate use 03/13/2018  . Chronic musculoskeletal pain 03/13/2018  . Vitamin D deficiency 01/13/2018  . Leukocytosis 08/21/2017  . 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 Hb-SS disease without crisis ([place]) 08/15/2016  . Anemia of chronic disease   . Systolic ejection murmur 51/12/5850  . Sickle cell anemia of mother during pregnancy ([facility]) 05/16/2014    PLAN: Continue pain management per IM. US done today and will review result and recommendation  Emeterio Reeve 01/29/2019,10:37 AM

## 2019-01-30 MED ORDER — OXYCODONE HCL 5 MG PO TABS
10.0000 mg | ORAL_TABLET | ORAL | Status: DC | PRN
  Administered 2019-01-30 – 2019-02-02 (×6): 10 mg via ORAL
  Filled 2019-01-30 (×6): qty 2

## 2019-01-30 MED ORDER — HYDROMORPHONE 1 MG/ML IV SOLN
INTRAVENOUS | Status: DC
  Administered 2019-01-30: 3 mg via INTRAVENOUS
  Administered 2019-01-30: 2 mg via INTRAVENOUS
  Administered 2019-01-30: 15:00:00 via INTRAVENOUS
  Administered 2019-01-30: 5.5 mg via INTRAVENOUS
  Administered 2019-01-31: 43.1 mg via INTRAVENOUS
  Administered 2019-01-31: 5.5 mg via INTRAVENOUS
  Filled 2019-01-30: qty 25

## 2019-01-30 NOTE — Progress Notes (Signed)
Patient ID: Carmen [name], female   DOB: 19-Jun-1991, 28 y.o.   MRN: [id number] [place]) NOTE  Carmen [name] is a 28 y.o. G2P0010 at [redacted]w[redacted]d by best clinical estimate who is admitted for sickle cell crisis.   Fetal presentation is cephalic. Length of Stay:  5  Days  Subjective: Slight improvement in pain, ambulating well and asks to not receive Lovenox Patient reports the fetal movement as active. Patient reports uterine contraction  activity as none. Patient reports  vaginal bleeding as none. Patient describes fluid per vagina as None.  Vitals:  Blood pressure 125/64, pulse 85, temperature 98.3 F (36.8 C), temperature source Oral, resp. rate 18, height 5\' 3"  (1.6 m), weight 77.1 kg, last menstrual period 05/15/2018, SpO2 100 %. Physical Examination:  General appearance - alert, well appearing, and in no distress Heart - normal rate and regular rhythm Abdomen - soft, nontender, nondistended Fundal Height:  size equals dates Cervical Exam: Not evaluated.  Extremities: extremities normal, atraumatic, no cyanosis or edema and Homans sign is negative, no sign of DVT  Membranes:intact  Fetal Monitoring:  Baseline: 130 bpm, Variability: Good {> 6 bpm), Accelerations: Reactive and Decelerations: Absent  Labs:  Results for orders placed or performed during the hospital encounter of 01/24/19 (from the past 24 hour(s))  Type and screen [name]   Collection Time: 01/30/19  7:17 AM  Result Value Ref Range   ABO/RH(D)      O POS Performed at [facility], [place] [address], [place], [place] [address]    Antibody Screen PENDING    Sample Expiration PENDING      Medications:  Scheduled . docusate sodium  100 mg Oral Daily  . enoxaparin (LOVENOX) injection  40 mg Subcutaneous Q24H  . feeding supplement (ENSURE ENLIVE)  237 mL Oral TID  . folic acid  1 mg Oral Daily  . HYDROmorphone   Intravenous Q4H  . morphine  30 mg Oral Q12H   . prenatal multivitamin  1 tablet Oral Daily  . sodium chloride flush  3 mL Intravenous Q12H   I have reviewed the patient's current medications.  ASSESSMENT: Patient Active Problem List   Diagnosis Date Noted  . Pregnancy 01/25/2019  . Sickle cell crisis ([place]) 01/25/2019  . Sickle cell pain crisis ([place]) 12/04/2018  . Alpha thalassemia silent carrier 12/04/2018  . 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 Pain in extremity   . Supervision of high risk pregnancy, antepartum 08/17/2018  . Chronic prescription opiate use 03/13/2018  . Chronic musculoskeletal pain 03/13/2018  . Vitamin D deficiency 01/13/2018  . Leukocytosis 08/21/2017  . 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[place]) 04/05/2017  . Hb-SS disease without crisis ([place]) 08/15/2016  . Anemia of chronic disease   . Systolic ejection murmur 37/85/8850  . Sickle cell anemia of mother during pregnancy [facility]) 05/16/2014    PLAN: Pain management per [facility] medicine. [place] reviewed with normal growth, delivery 39-40 weeks  Emeterio Reeve 01/30/2019,9:40 AM

## 2019-01-30 NOTE — Progress Notes (Signed)
Subjective: Carmen [name], a 28 year old female with a medical history significant for sickle cell disease, type SS, chronic pain syndrome, opiate dependence, and third trimester pregnancy was admitted and sickle cell pain crisis.  Patient states that pain has improved today.  Pain intensity is 7/10, primarily to lower extremities.  Patient also endorses bilateral lower extremity swelling.  Patient has been ambulating in halls without assistance.  Patient is afebrile and maintaining oxygen saturation above 90% on RA.  Patient denies vaginal bleeding, abnormal vaginal discharge, or abdominal cramping.  Patient also reports fetal movement.  She denies shortness of breath, chest pains, persistent cough, nausea, vomiting, or diarrhea.  Objective:  Vital signs in last 24 hours:  Vitals:   01/30/19 0858 01/30/19 1124 01/30/19 1233 01/30/19 1453  BP: 125/64 117/87    Pulse: 85 84    Resp: 18 18 15 15   Temp: 98.3 F (36.8 C) 98.5 F (36.9 C)    TempSrc: Oral Oral    SpO2: 100% 99%  100%  Weight:      Height:        Intake/Output from previous day:  No intake or output data in the 24 hours ending 01/30/19 1520 Physical Exam Constitutional:      General: She is not in acute distress. HENT:     Right Ear: Tympanic membrane normal.     Left Ear: Tympanic membrane normal.     Nose: Nose normal.     Mouth/Throat:     Mouth: Mucous membranes are moist.  Eyes:     Pupils: Pupils are equal, round, and reactive to light.  Cardiovascular:     Rate and Rhythm: Normal rate and regular rhythm.     Pulses: Normal pulses.     Heart sounds: Normal heart sounds.  Pulmonary:     Effort: Pulmonary effort is normal.     Breath sounds: Normal breath sounds.  Abdominal:     General: Abdomen is flat. Bowel sounds are normal.  Musculoskeletal:     Right lower leg: 2+ Pitting Edema present.     Left lower leg: 2+ Pitting Edema present.  Neurological:     General: No focal deficit present.      Mental Status: She is alert and oriented to person, place, and time. Mental status is at baseline.  Psychiatric:        Mood and Affect: Mood normal.        Behavior: Behavior normal.        Thought Content: Thought content normal.        Judgment: Judgment normal.     Lab Results:  Basic Metabolic Panel:    Component Value Date/Time   NA 134 (L) 01/28/2019 0921   NA 139 08/17/2018 0935   K 4.0 01/28/2019 0921   CL 104 01/28/2019 0921   CO2 24 01/28/2019 0921   BUN 9 01/28/2019 0921   BUN 9 08/17/2018 0935   CREATININE 0.59 01/28/2019 0921   GLUCOSE 87 01/28/2019 0921   CALCIUM 8.6 (L) 01/28/2019 0921   CBC:    Component Value Date/Time   WBC 15.2 (H) 01/29/2019 0519   HGB 8.5 (L) 01/29/2019 0519   HGB 7.5 (L) 11/29/2018 0841   HCT 22.9 (L) 01/29/2019 0519   HCT 21.8 (L) 11/29/2018 0841   PLT 354 01/29/2019 0519   PLT 582 (H) 11/29/2018 0841   MCV 89.8 01/29/2019 0519   MCV 92 11/29/2018 0841   NEUTROABS 11.4 (H) 01/28/2019 0921   NEUTROABS 7.9 (  H) 09/15/2018 0933   LYMPHSABS 2.0 01/28/2019 0921   LYMPHSABS 2.4 09/15/2018 0933   MONOABS 1.2 (H) 01/28/2019 0921   EOSABS 0.0 01/28/2019 0921   EOSABS 0.1 09/15/2018 0933   BASOSABS 0.0 01/28/2019 0921   BASOSABS 0.0 09/15/2018 0933    Recent Results (from the past 240 hour(s))  Culture, beta strep (group b only)     Status: None   Collection Time: 01/25/19  3:50 PM  Result Value Ref Range Status   Specimen Description VAGINAL/RECTAL  Final   Special Requests NONE  Final   Culture   Final    PATIENT RESULTS CORRECTED WITH MIX, SUGGESTS FACTOR DEFICIENCY CULTURE REINCUBATED FOR BETTER GROWTH CORRECTED ON 03/27 AT 2951: PREVIOUSLY REPORTED AS NO GROUP B STREP (S.AGALACTIAE) ISOLATED PREVIOUSLY REPORTED AS: NO GROUP B STREP (S.AGALACTIAE) ISOLATED CORRECTED RESULTS CALLED TO: RN JEN COOK GROUP B STREP(S.AGALACTIAE)ISOLATED TESTING AGAINST S. AGALACTIAE NOT ROUTINELY PERFORMED DUE TO PREDICTABILITY OF AMP/PEN/VAN  SUSCEPTIBILITY. CRITICAL RESULT CALLED TO, READ BACK BY AND VERIFIED WITH: RN F MORRIS [phone] AT 750 BY CM Performed at [facility], [place] [address] [place], [place] [address]    Report Status 01/27/2019 FINAL  Final    Studies/Results: US Fetal Bpp Wo Non Stress  Result Date: 01/29/2019 ----------------------------------------------------------------------  OBSTETRICS REPORT                         (Signed Final 01/29/2019 10:49 am) ---------------------------------------------------------------------- Patient Info  ID #:        [id number]                          D.O.B.:  16-May-1991 (28 yrs)  Name:        Carmen [name]                  Visit Date: 01/29/2019 08:15 am ---------------------------------------------------------------------- Performed By  Performed By:      Corky Crafts             Ref. Address:      [facility]                                                               [place]  [place], [place]  Attending:         Sander Nephew      Location:          Women's and                     MD                                        [place]  Referred By:       [facility] for [place] ---------------------------------------------------------------------- Orders   #  Description                           Code         Ordered By   1  [place] MFM OB FOLLOW UP                   [id number]     MICHAEL ERVIN   2  US FETAL BPP WO NON STRESS            76819.0      MICHAEL ERVIN  ----------------------------------------------------------------------   #  Order #                     Accession #                 Episode #   1  [id number]                    [id number]                  [id number]   2  [id number]                   [id number]                  [id number]  ---------------------------------------------------------------------- Indications   Encounter for other antenatal screening         Z36.2   follow-up (low risk NIPS, AFP- neg)   [redacted] weeks gestation of pregnancy                 Z3A.37   Maternal sickle cell anemia, third trimester    O99.013, Q11.9   Drug use complicating pregnancy, third          O99.323   trimester (chronic opoid abuse)  ---------------------------------------------------------------------- Vital Signs  Weight (lb):  170                               Height:        5'3"  BMI:          30.11 ---------------------------------------------------------------------- Fetal  Evaluation  Num Of Fetuses:          1  Fetal Heart Rate(bpm):   132  Cardiac Activity:        Observed  Presentation:            Cephalic  Placenta:                Posterior  P. Cord Insertion:       Previously Visualized  Amniotic Fluid  AFI FV:      Within normal limits  AFI Sum(cm)     %Tile       Largest Pocket(cm)  15.49           58          4.72  RUQ(cm)       RLQ(cm)        LUQ(cm)        LLQ(cm)  4.72          4              4.17           2.6 ---------------------------------------------------------------------- Biophysical Evaluation  Amniotic F.V:   Within normal limits        F. Tone:         Observed  F. Movement:    Observed                    Score:           8/8  F. Breathing:   Observed ---------------------------------------------------------------------- Biometry  BPD:      89.2   mm     G. Age:  36w 1d         39  %    CI:         77.26  %    70 - 86                                                           FL/HC:       20.6  %    20.8 - 22.6  HC:      321.3   mm     G. Age:  36w 2d         12  %    HC/AC:       1.05       0.92 - 1.05  AC:      307.4   mm     G. Age:  34w 5d          9  %    FL/BPD:      74.1  %    71 - 87  FL:       66.1   mm     G. Age:   34w 0d        < 3  %    FL/AC:       21.5  %    20 - 24  HUM:      57.4   mm     G. Age:  33w 2d        < 5  %  Est. FW:    2522   gm      5 lb 9 oz  22  % ---------------------------------------------------------------------- OB History  Gravidity:     2  TOP:           1 ---------------------------------------------------------------------- Gestational Age  LMP:            37w 0d       Date:  05/15/18                   EDD:  02/19/19  U/S Today:      35w 2d                                         EDD:  03/03/19  Best:           37w 0d    Det. By:  LMP  (05/15/18)            EDD:  02/19/19 ---------------------------------------------------------------------- Anatomy  Cranium:                Appears normal         LVOT:                   Previously seen  Cavum:                  Previously seen        Aortic Arch:            Previously seen  Ventricles:             Previously seen        Ductal Arch:            Previously seen  Choroid Plexus:         Previously seen        Diaphragm:              Previously seen  Cerebellum:             Previously seen        Stomach:                Appears normal, left                                                                         sided  Posterior Fossa:        Previously seen        Abdomen:                Previously seen  Nuchal Fold:            Previously seen        Abdominal Wall:         Previously seen  Face:                   Orbits and profile     Cord Vessels:           Previously seen                          previously seen  Lips:  Previously seen        Kidneys:                Appear normal  Palate:                 Previously seen        Bladder:                Appears normal  Thoracic:               Appears normal         Spine:                  Previously seen  Heart:                  Previously seen        Upper Extremities:      Previously seen  RVOT:                   Previously seen        Lower Extremities:      Previously seen   Other:   Fetus appears to be a female prev seen. Heels and 5th digit visualized           previously. Open hands visualized previously. Nasal bone visualized           previously. ---------------------------------------------------------------------- Cervix Uterus Adnexa  Cervix  Not visualized (advanced GA >24wks) ---------------------------------------------------------------------- Impression  Normal interval growth.  Biophysical profile 8/8  SCD ---------------------------------------------------------------------- Recommendations  Continue weekly testing with delivery between 39-40 weeks. ----------------------------------------------------------------------               Sander Nephew, MD Electronically Signed Final Report   01/29/2019 10:49 am ----------------------------------------------------------------------  [place] Mfm Ob Follow Up  Result Date: 01/29/2019 ----------------------------------------------------------------------  OBSTETRICS REPORT                         (Signed Final 01/29/2019 10:49 am) ---------------------------------------------------------------------- Patient Info  ID #:        [id number]                          D.O.B.:  [birth year] (28 yrs)  Name:        Carmen [name]                  Visit Date: 01/29/2019 08:15 am ---------------------------------------------------------------------- Performed By  Performed By:      Corky Crafts             Ref. Address:      [facility]                                                               [address]  [place], [place]  Attending:         Sander Nephew      Location:          Women's and                     MD                                        [place]  Referred By:       [facility] for [place] ---------------------------------------------------------------------- Orders   #  Description                           Code         Ordered By   1  [place] MFM OB FOLLOW UP                   [id number]     MICHAEL ERVIN   2  US FETAL BPP WO NON STRESS            76819.0      MICHAEL ERVIN  ----------------------------------------------------------------------   #  Order #                     Accession #                 Episode #   1  [id number]                   [id number]                  [id number]   2  [id number]                   [id number]                  [id number]  ---------------------------------------------------------------------- Indications   Encounter for other antenatal screening         Z36.2   follow-up (low risk NIPS, AFP- neg)   [redacted] weeks gestation of pregnancy                 Z3A.37   Maternal sickle cell anemia, third trimester    O99.013, J85.6   Drug use complicating pregnancy, third          O99.323   trimester (chronic opoid  abuse)  ---------------------------------------------------------------------- Vital Signs  Weight (lb):  170                               Height:        5'3"  BMI:          30.11 ---------------------------------------------------------------------- Fetal Evaluation  Num Of Fetuses:          1  Fetal Heart Rate(bpm):   132  Cardiac Activity:        Observed  Presentation:            Cephalic  Placenta:                Posterior  P. Cord Insertion:       Previously Visualized  Amniotic Fluid  AFI FV:      Within normal limits  AFI Sum(cm)     %Tile       Largest Pocket(cm)  15.49           58          4.72  RUQ(cm)       RLQ(cm)        LUQ(cm)        LLQ(cm)  4.72          4              4.17           2.6 ---------------------------------------------------------------------- Biophysical Evaluation  Amniotic F.V:   Within normal limits        F. Tone:         Observed   F. Movement:    Observed                    Score:           8/8  F. Breathing:   Observed ---------------------------------------------------------------------- Biometry  BPD:      89.2   mm     G. Age:  36w 1d         39  %    CI:         77.26  %    70 - 86                                                           FL/HC:       20.6  %    20.8 - 22.6  HC:      321.3   mm     G. Age:  36w 2d         12  %    HC/AC:       1.05       0.92 - 1.05  AC:      307.4   mm     G. Age:  34w 5d          9  %    FL/BPD:      74.1  %    71 - 87  FL:       66.1   mm     G. Age:  34w 0d        < 3  %    FL/AC:  21.5  %    20 - 24  HUM:      57.4   mm     G. Age:  33w 2d        < 5  %  Est. FW:    2522   gm      5 lb 9 oz     22  % ---------------------------------------------------------------------- OB History  Gravidity:     2  TOP:           1 ---------------------------------------------------------------------- Gestational Age  LMP:            37w 0d       Date:  05/15/18                   EDD:  02/19/19  U/S Today:      35w 2d                                         EDD:  03/03/19  Best:           37w 0d    Det. By:  LMP  (05/15/18)            EDD:  02/19/19 ---------------------------------------------------------------------- Anatomy  Cranium:                Appears normal         LVOT:                   Previously seen  Cavum:                  Previously seen        Aortic Arch:            Previously seen  Ventricles:             Previously seen        Ductal Arch:            Previously seen  Choroid Plexus:         Previously seen        Diaphragm:              Previously seen  Cerebellum:             Previously seen        Stomach:                Appears normal, left                                                                         sided  Posterior Fossa:        Previously seen        Abdomen:                Previously seen  Nuchal Fold:            Previously seen        Abdominal Wall:         Previously  seen  Face:  Orbits and profile     Cord Vessels:           Previously seen                          previously seen  Lips:                   Previously seen        Kidneys:                Appear normal  Palate:                 Previously seen        Bladder:                Appears normal  Thoracic:               Appears normal         Spine:                  Previously seen  Heart:                  Previously seen        Upper Extremities:      Previously seen  RVOT:                   Previously seen        Lower Extremities:      Previously seen  Other:   Fetus appears to be a female prev seen. Heels and 5th digit visualized           previously. Open hands visualized previously. Nasal bone visualized           previously. ---------------------------------------------------------------------- Cervix Uterus Adnexa  Cervix  Not visualized (advanced GA >24wks) ---------------------------------------------------------------------- Impression  Normal interval growth.  Biophysical profile 8/8  SCD ---------------------------------------------------------------------- Recommendations  Continue weekly testing with delivery between 39-40 weeks. ----------------------------------------------------------------------               Sander Nephew, MD Electronically Signed Final Report   01/29/2019 10:49 am ----------------------------------------------------------------------   Medications: Scheduled Meds: . docusate sodium  100 mg Oral Daily  . enoxaparin (LOVENOX) injection  40 mg Subcutaneous Q24H  . feeding supplement (ENSURE ENLIVE)  237 mL Oral TID  . folic acid  1 mg Oral Daily  . HYDROmorphone   Intravenous Q4H  . morphine  30 mg Oral Q12H  . prenatal multivitamin  1 tablet Oral Daily  . sodium chloride flush  3 mL Intravenous Q12H   Continuous Infusions: . diphenhydrAMINE    . lactated ringers 100 mL/hr at 01/30/19 1421   PRN Meds:.acetaminophen, calcium carbonate, diphenhydrAMINE  **OR** diphenhydrAMINE, naloxone **AND** sodium chloride flush, ondansetron (ZOFRAN) IV, ondansetron **OR** ondansetron (ZOFRAN) IV, oxyCODONE, polyethylene glycol, zolpidem  Assessment/Plan: Principal Problem:   Sickle cell pain crisis ([place]) Active Problems:   Leukocytosis   Chronic musculoskeletal pain   Pregnancy   Sickle cell crisis (H[facility])  Sickle cell anemia with pain crisis: Continue IV fluids to KVO Weaning weight-based Dilaudid PCA, decrease settings to 0.5 mg, 10-minute lockout, and 3 mg/h Monitor vital signs closely Reevaluate pain scale regularly Maintain oxygen saturation above 90%  Chronic pain syndrome: MS Contin 30 mg every 12 hours Oxycodone 10 mg every 4 hours as needed for moderate to severe breakthrough pain  Leukocytosis: Stable.  Patient afebrile, no signs of infection. CBC in a.m.  Anemia  of chronic disease: Hemoglobin stable.  Patient is s/p 1 unit packed red blood cells on 01/28/2019 CBC in a.m.  Third trimester pregnancy: Continue care per OB/GYN    Code Status: Full Code Family Communication: N/A Disposition Plan: Not yet ready for discharge  [place], MSN, FNP-C [address] [place], [place] [address] [phone]  If 5PM-7AM, please contact night-coverage.  01/30/2019, 3:20 PM  LOS: 5 days

## 2019-01-31 DIAGNOSIS — Z3689 Encounter for other specified antenatal screening: Secondary | ICD-10-CM

## 2019-01-31 LAB — CBC
HCT: 23.3 % — ABNORMAL LOW (ref 36.0–46.0)
Hemoglobin: 7.9 g/dL — ABNORMAL LOW (ref 12.0–15.0)
MCH: 31.2 pg (ref 26.0–34.0)
MCHC: 33.9 g/dL (ref 30.0–36.0)
MCV: 92.1 fL (ref 80.0–100.0)
Platelets: 338 10*3/uL (ref 150–400)
RBC: 2.53 MIL/uL — ABNORMAL LOW (ref 3.87–5.11)
RDW: 16.1 % — AB (ref 11.5–15.5)
WBC: 11 10*3/uL — ABNORMAL HIGH (ref 4.0–10.5)
nRBC: 23.4 % — ABNORMAL HIGH (ref 0.0–0.2)

## 2019-01-31 LAB — BASIC METABOLIC PANEL
Anion gap: 9 (ref 5–15)
BUN: 12 mg/dL (ref 6–20)
CO2: 23 mmol/L (ref 22–32)
CREATININE: 0.63 mg/dL (ref 0.44–1.00)
Calcium: 8.9 mg/dL (ref 8.9–10.3)
Chloride: 102 mmol/L (ref 98–111)
GFR calc Af Amer: >60 mL/min (ref >60)
GFR calc non Af Amer: >60 mL/min (ref >60)
Glucose, Bld: 82 mg/dL (ref 70–99)
Potassium: 3.8 mmol/L (ref 3.5–5.1)
Sodium: 134 mmol/L — ABNORMAL LOW (ref 135–145)

## 2019-01-31 MED ORDER — HYDROMORPHONE 1 MG/ML IV SOLN
INTRAVENOUS | Status: DC
  Administered 2019-01-31: 11.1 mg via INTRAVENOUS
  Administered 2019-01-31: 7.4 mg via INTRAVENOUS
  Administered 2019-02-01: 19.5 mg via INTRAVENOUS
  Administered 2019-02-01: 6.9 mg via INTRAVENOUS
  Administered 2019-02-01: 05:00:00 via INTRAVENOUS
  Administered 2019-02-01: 3.9 mg via INTRAVENOUS
  Filled 2019-01-31 (×2): qty 25

## 2019-01-31 NOTE — Progress Notes (Signed)
Attempted EFM with patient.  Patient still on cell phone and wants to eat before monitoring.

## 2019-01-31 NOTE — Progress Notes (Signed)
Patient ID: Carmen [name], female   DOB: Feb 12, 1991, 28 y.o.   MRN: [id number] Signed         Show:Clear all [x] Manual[x] Template[] Copied  Added by: [x] Woodroe Mode, MD  [] Hover for details Patient ID: Carmen [name], female   DOB: October 15, 1991, 28 y.o.   MRN: [id number] [place]) NOTE  Carmen [name] is a 28 y.o. G2P0010 at [redacted]w[redacted]d by best clinical estimate who is admitted for sickle cell crisis.   Fetal presentation is cephalic. Length of Stay:  6  Days  Subjective: Slight improvement in pain, ambulating well and asks to not receive Lovenox Patient reports the fetal movement as active. Patient reports uterine contraction  activity as none. Patient reports  vaginal bleeding as none. Patient describes fluid per vagina as None.  Blood pressure 128/63, pulse 71, temperature 98.8 F (37.1 C), temperature source Oral, resp. rate 17, height 5\' 3"  (1.6 m), weight 77.1 kg, last menstrual period 05/15/2018, SpO2 97 %.  Physical Examination:  General appearance - alert, well appearing, and in no distress Heart - normal rate and regular rhythm Abdomen - soft, nontender, nondistended Fundal Height:  size equals dates Cervical Exam: Not evaluated.  Extremities: extremities normal, atraumatic, no cyanosis or edema and Homans sign is negative, no sign of DVT  Membranes:intact  Fetal Monitoring:   Fetal Heart Rate A  Mode External filed at 01/30/2019 2101  Baseline Rate (A) 135 bpm filed at 01/30/2019 2101  Variability <5 BPM, 6-25 BPM filed at 01/30/2019 2101  Accelerations 15 x 15 filed at 01/30/2019 2101  Decelerations None filed at 01/30/2019 2101    Labs:        Results for orders placed or performed during the hospital encounter of 01/24/19 (from the past 24 hour(s))  Type and screen [place]   Collection Time: 01/30/19  7:17 AM  Result Value Ref Range   ABO/RH(D)      O POS Performed at [facility],  [place] [address], [place],  [address]    Antibody Screen PENDING    Sample Expiration PENDING      Medications:  Scheduled . docusate sodium  100 mg Oral Daily  . enoxaparin (LOVENOX) injection  40 mg Subcutaneous Q24H  . feeding supplement (ENSURE ENLIVE)  237 mL Oral TID  . folic acid  1 mg Oral Daily  . HYDROmorphone   Intravenous Q4H  . morphine  30 mg Oral Q12H  . prenatal multivitamin  1 tablet Oral Daily  . sodium chloride flush  3 mL Intravenous Q12H   I have reviewed the patient's current medications.  ASSESSMENT:     Patient Active Problem List   Diagnosis Date Noted  . Pregnancy 01/25/2019  . Sickle cell crisis ([place]) 01/25/2019  . Sickle cell pain crisis ([place]) 12/04/2018  . Alpha thalassemia silent carrier 12/04/2018  . 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 Pain in extremity   . Supervision of high risk pregnancy, antepartum 08/17/2018  . Chronic prescription opiate use 03/13/2018  . Chronic musculoskeletal pain 03/13/2018  . Vitamin D deficiency 01/13/2018  . Leukocytosis 08/21/2017  . 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[place]) 04/05/2017  . Hb-SS disease without crisis ([place]) 08/15/2016  . Anemia of chronic disease   . Systolic ejection murmur 72/07/4708  . Sickle cell anemia of mother during pregnancy [facility]) 05/16/2014    PLAN: Pain management per [facility] medicine.   Woodroe Mode, MD 01/31/2019 9:34 AM

## 2019-01-31 NOTE — Progress Notes (Signed)
Subjective: Carmen [name], a 28 year old female with a medical history significant for sickle cell disease, type SS, chronic pain syndrome, opiate dependence, and third trimester pregnancy was admitted in sickle cell pain crisis.  Patient continues to have pain primarily to lower extremities.  Patient also endorses bilateral lower extremity swelling.  She continues to ambulate without assistance.  Pain intensity is 6/10, patient does not feel as if she can manage at home.  Patient's goal is 4/10.  Patient is afebrile and maintaining oxygen saturation above 90% on RA.  Patient denies vaginal bleeding, abdominal discharge, or abdominal cramping.  Patient reports fetal movement.  She denies shortness of breath, cough, chest pains, nausea, vomiting, diarrhea, or constipation.   Objective:  Vital signs in last 24 hours:  Vitals:   01/31/19 0323 01/31/19 0357 01/31/19 0850 01/31/19 0926  BP:  128/63    Pulse:  71    Resp: 15 16 16 17   Temp:  98.6 F (37 C)  98.8 F (37.1 C)  TempSrc:  Oral  Oral  SpO2: 98% 98% 99% 97%  Weight:      Height:        Intake/Output from previous day:  No intake or output data in the 24 hours ending 01/31/19 1124  Physical Exam Constitutional:      General: She is not in acute distress. HENT:     Nose: Nose normal.     Mouth/Throat:     Mouth: Mucous membranes are moist.  Eyes:     Pupils: Pupils are equal, round, and reactive to light.  Cardiovascular:     Rate and Rhythm: Normal rate and regular rhythm.  Pulmonary:     Effort: Pulmonary effort is normal.     Breath sounds: Normal breath sounds.  Abdominal:     General: Bowel sounds are normal.     Palpations: Abdomen is soft.  Musculoskeletal:     Right lower leg: Edema present.     Left lower leg: Edema present.  Neurological:     General: No focal deficit present.  Psychiatric:        Mood and Affect: Mood normal.        Behavior: Behavior normal.        Thought Content: Thought  content normal.        Judgment: Judgment normal.     Lab Results:  Basic Metabolic Panel:    Component Value Date/Time   NA 134 (L) 01/31/2019 0541   NA 139 08/17/2018 0935   K 3.8 01/31/2019 0541   CL 102 01/31/2019 0541   CO2 23 01/31/2019 0541   BUN 12 01/31/2019 0541   BUN 9 08/17/2018 0935   CREATININE 0.63 01/31/2019 0541   GLUCOSE 82 01/31/2019 0541   CALCIUM 8.9 01/31/2019 0541   CBC:    Component Value Date/Time   WBC 11.0 (H) 01/31/2019 0541   HGB 7.9 (L) 01/31/2019 0541   HGB 7.5 (L) 11/29/2018 0841   HCT 23.3 (L) 01/31/2019 0541   HCT 21.8 (L) 11/29/2018 0841   PLT 338 01/31/2019 0541   PLT 582 (H) 11/29/2018 0841   MCV 92.1 01/31/2019 0541   MCV 92 11/29/2018 0841   NEUTROABS 11.4 (H) 01/28/2019 0921   NEUTROABS 7.9 (H) 09/15/2018 0933   LYMPHSABS 2.0 01/28/2019 0921   LYMPHSABS 2.4 09/15/2018 0933   MONOABS 1.2 (H) 01/28/2019 0921   EOSABS 0.0 01/28/2019 0921   EOSABS 0.1 09/15/2018 0933   BASOSABS 0.0 01/28/2019 9983  BASOSABS 0.0 09/15/2018 0933    Recent Results (from the past 240 hour(s))  Culture, beta strep (group b only)     Status: None   Collection Time: 01/25/19  3:50 PM  Result Value Ref Range Status   Specimen Description VAGINAL/RECTAL  Final   Special Requests NONE  Final   Culture   Final    PATIENT RESULTS CORRECTED WITH MIX, SUGGESTS FACTOR DEFICIENCY CULTURE REINCUBATED FOR BETTER GROWTH CORRECTED ON 03/27 AT 4967: PREVIOUSLY REPORTED AS NO GROUP B STREP (S.AGALACTIAE) ISOLATED PREVIOUSLY REPORTED AS: NO GROUP B STREP (S.AGALACTIAE) ISOLATED CORRECTED RESULTS CALLED TO: RN JEN COOK GROUP B STREP(S.AGALACTIAE)ISOLATED TESTING AGAINST S. AGALACTIAE NOT ROUTINELY PERFORMED DUE TO PREDICTABILITY OF AMP/PEN/VAN SUSCEPTIBILITY. CRITICAL RESULT CALLED TO, READ BACK BY AND VERIFIED WITH: RN F MORRIS [phone] AT 750 BY CM Performed at [facility], [place] [address], [place], [place] [address]    Report Status 01/27/2019 FINAL   Final    Studies/Results: No results found.  Medications: Scheduled Meds: . docusate sodium  100 mg Oral Daily  . enoxaparin (LOVENOX) injection  40 mg Subcutaneous Q24H  . feeding supplement (ENSURE ENLIVE)  237 mL Oral TID  . folic acid  1 mg Oral Daily  . HYDROmorphone   Intravenous Q4H  . morphine  30 mg Oral Q12H  . prenatal multivitamin  1 tablet Oral Daily  . sodium chloride flush  3 mL Intravenous Q12H   Continuous Infusions: . diphenhydrAMINE    . lactated ringers 100 mL/hr at 01/31/19 1012   PRN Meds:.acetaminophen, calcium carbonate, diphenhydrAMINE **OR** diphenhydrAMINE, naloxone **AND** sodium chloride flush, ondansetron (ZOFRAN) IV, ondansetron **OR** ondansetron (ZOFRAN) IV, oxyCODONE, polyethylene glycol, zolpidem  Assessment/Plan: Principal Problem:   Sickle cell pain crisis ([place]) Active Problems:   Leukocytosis   Chronic musculoskeletal pain   Pregnancy   Sickle cell crisis ([facility])  Sickle cell anemia with pain crisis:  Continue IV fluids at [place] dilaudid PCA, decreased settings to 0.3 mg, 10 minute lockout, and 2 mg/h Continue to monitory vital signs closely Reevaluate pain scale regularly Maintain oxygen saturation above 90% Discharge home planned for 02/01/2019  Chronic pain syndrome: MS Contin 30 mg every 12 hours Oxycodone 10 mg every 4 hours as needed for moderate to severe breakthrough pain  Leukocytosis:  Stable. Patient afebrile. No signs of infection  Anemia of chronic disease:  Hemoglobin is 7.9, which has improved from previous. Patient is s/p 1 unit of packed red blood cells.   Continue to follow CBC  Third trimester pregnancy:  Continue care per OB/GYN   Code Status: Full Code Family Communication: N/A Disposition Plan: Not yet ready for discharge  [place], MSN, FNP-C Patient [place], [place] [address] [phone]  If 5PM-7AM, please contact  night-coverage.  01/31/2019, 11:24 AM  LOS: 6 days

## 2019-01-31 NOTE — Progress Notes (Signed)
Pt. Refuses EFM @ this time.  Patient talking on her cell phone.  Patient states she will notifiy RN when ready for monitoring.

## 2019-02-01 LAB — CREATININE, SERUM
Creatinine, Ser: 0.62 mg/dL (ref 0.44–1.00)
GFR calc Af Amer: >60 mL/min (ref >60)
GFR calc non Af Amer: >60 mL/min (ref >60)

## 2019-02-01 MED ORDER — HYDROMORPHONE 1 MG/ML IV SOLN: INTRAVENOUS | Status: DC

## 2019-02-01 MED ORDER — HYDROMORPHONE 1 MG/ML IV SOLN
INTRAVENOUS | Status: DC
  Administered 2019-02-01: 9 mg via INTRAVENOUS
  Administered 2019-02-01: 4.5 mg via INTRAVENOUS
  Administered 2019-02-01: 1 mg via INTRAVENOUS
  Administered 2019-02-01: 1.5 mg via INTRAVENOUS
  Administered 2019-02-01: 1 mg via INTRAVENOUS
  Administered 2019-02-01: 20:00:00 via INTRAVENOUS
  Administered 2019-02-02 (×2): 1 mg via INTRAVENOUS
  Administered 2019-02-02: 12.5 mg via INTRAVENOUS
  Administered 2019-02-02: 4.5 mg via INTRAVENOUS
  Filled 2019-02-01: qty 25

## 2019-02-01 NOTE — Progress Notes (Addendum)
Subjective: Carmen [name], a 28 year old female with a medical history significant for sickle cell disease, type SS, chronic pain syndrome, opiate dependence, and trimester pregnancy was admitted with sickle cell pain crisis.  Patient is complaining of increased pain to low back and lower extremities.  Patient also complaining of contractions.  RN states that patient is having contractions around every 6 minutes.  OB/GYN aware. Pain intensity 9/10 characterized as constant and aching.  Patient states that she has been unable to get comfortable despite Dilaudid PCA.  Patient currently denies vaginal bleeding, abdominal cramping, or abnormal vaginal discharge. She denies chest pains, shortness of breath, dysuria, nausea, vomiting, or diarrhea.  Patient is afebrile and maintaining oxygen saturation at 96% on RA.  Objective:  Vital signs in last 24 hours:  Vitals:   01/31/19 2041 02/01/19 0016 02/01/19 0823 02/01/19 1211  BP:      Pulse:      Resp: 14 17 14 16   Temp:      TempSrc:      SpO2: 96% 96% 98%   Weight:      Height:        Intake/Output from previous day:   Intake/Output Summary (Last 24 hours) at 02/01/2019 1252 Last data filed at 02/01/2019 1225 Gross per 24 hour  Intake 240 ml  Output 3500 ml  Net -3260 ml   Physical Exam Constitutional:      Appearance: She is obese.  HENT:     Head: Normocephalic and atraumatic.     Mouth/Throat:     Mouth: Mucous membranes are moist.  Eyes:     Pupils: Pupils are equal, round, and reactive to light.  Cardiovascular:     Rate and Rhythm: Normal rate and regular rhythm.     Pulses: Normal pulses.     Heart sounds: Normal heart sounds.  Pulmonary:     Effort: Pulmonary effort is normal.     Breath sounds: Normal breath sounds.  Abdominal:     General: Bowel sounds are normal.  Musculoskeletal:     Right lower leg: 2+ Pitting Edema present.     Left lower leg: 2+ Pitting Edema present.  Neurological:     Mental Status:  She is alert.  Psychiatric:        Mood and Affect: Mood normal.        Behavior: Behavior normal.        Thought Content: Thought content normal.        Judgment: Judgment normal.      Lab Results:  Basic Metabolic Panel:    Component Value Date/Time   NA 134 (L) 01/31/2019 0541   NA 139 08/17/2018 0935   K 3.8 01/31/2019 0541   CL 102 01/31/2019 0541   CO2 23 01/31/2019 0541   BUN 12 01/31/2019 0541   BUN 9 08/17/2018 0935   CREATININE 0.62 02/01/2019 0623   GLUCOSE 82 01/31/2019 0541   CALCIUM 8.9 01/31/2019 0541   CBC:    Component Value Date/Time   WBC 11.0 (H) 01/31/2019 0541   HGB 7.9 (L) 01/31/2019 0541   HGB 7.5 (L) 11/29/2018 0841   HCT 23.3 (L) 01/31/2019 0541   HCT 21.8 (L) 11/29/2018 0841   PLT 338 01/31/2019 0541   PLT 582 (H) 11/29/2018 0841   MCV 92.1 01/31/2019 0541   MCV 92 11/29/2018 0841   NEUTROABS 11.4 (H) 01/28/2019 0921   NEUTROABS 7.9 (H) 09/15/2018 0933   LYMPHSABS 2.0 01/28/2019 0921   LYMPHSABS  2.4 09/15/2018 0933   MONOABS 1.2 (H) 01/28/2019 0921   EOSABS 0.0 01/28/2019 0921   EOSABS 0.1 09/15/2018 0933   BASOSABS 0.0 01/28/2019 0921   BASOSABS 0.0 09/15/2018 0933    Recent Results (from the past 240 hour(s))  Culture, beta strep (group b only)     Status: None   Collection Time: 01/25/19  3:50 PM  Result Value Ref Range Status   Specimen Description VAGINAL/RECTAL  Final   Special Requests NONE  Final   Culture   Final    PATIENT RESULTS CORRECTED WITH MIX, SUGGESTS FACTOR DEFICIENCY CULTURE REINCUBATED FOR BETTER GROWTH CORRECTED ON 03/27 AT 3762: PREVIOUSLY REPORTED AS NO GROUP B STREP (S.AGALACTIAE) ISOLATED PREVIOUSLY REPORTED AS: NO GROUP B STREP (S.AGALACTIAE) ISOLATED CORRECTED RESULTS CALLED TO: RN JEN COOK GROUP B STREP(S.AGALACTIAE)ISOLATED TESTING AGAINST S. AGALACTIAE NOT ROUTINELY PERFORMED DUE TO PREDICTABILITY OF AMP/PEN/VAN SUSCEPTIBILITY. CRITICAL RESULT CALLED TO, READ BACK BY AND VERIFIED WITH: RN F MORRIS  [phone] AT 750 BY CM Performed at [facility], [place] [address] [place], [place] [address]    Report Status 01/27/2019 FINAL  Final    Studies/Results: No results found.  Medications: Scheduled Meds: . docusate sodium  100 mg Oral Daily  . enoxaparin (LOVENOX) injection  40 mg Subcutaneous Q24H  . feeding supplement (ENSURE ENLIVE)  237 mL Oral TID  . folic acid  1 mg Oral Daily  . HYDROmorphone   Intravenous Q4H  . morphine  30 mg Oral Q12H  . prenatal multivitamin  1 tablet Oral Daily  . sodium chloride flush  3 mL Intravenous Q12H   Continuous Infusions: . diphenhydrAMINE    . lactated ringers 100 mL/hr at 02/01/19 0438   PRN Meds:.acetaminophen, calcium carbonate, diphenhydrAMINE **OR** diphenhydrAMINE, naloxone **AND** sodium chloride flush, ondansetron (ZOFRAN) IV, ondansetron **OR** ondansetron (ZOFRAN) IV, oxyCODONE, polyethylene glycol, zolpidem  Assessment/Plan: Principal Problem:   Sickle cell pain crisis (H[facility]) Active Problems:   Leukocytosis   Chronic musculoskeletal pain   Pregnancy   Sickle cell crisis (HCC)  Sickle cell anemia with pain crisis:  Dilaudid PCA, settings of 0.5 mg, 10-minute lockout, and 3 mg/h.  Dilaudid IV bolus of 1 mg every 3 hours as needed for moderate to severe breakthrough pain. Continue to monitor vital signs closely Reevaluate pain scale regularly Maintain oxygen saturation above 90%  Chronic pain syndrome: MS Contin 30 mg every 12 hours Oxycodone 10 mg every 4 hours as needed  Leukocytosis: Stable.  Patient afebrile.  No signs of infection. CBC in a.m.  Anemia of chronic disease: Hemoglobin stable.  Patient is s/p 1 unit of packed red blood cells. CBC in a.m.  Third trimester pregnancy: Continue care per OB/GYN  Code Status: Full Code Family Communication: N/A Disposition Plan: Not yet ready for discharge  [place], MSN, FNP-C Patient [place],  [id number] [phone]  If 5PM-7AM, please contact night-coverage.  02/01/2019, 12:52 PM  LOS: 7 days

## 2019-02-01 NOTE — Progress Notes (Signed)
Dilaudid pca 51ml wasted with [facility] Rn.

## 2019-02-01 NOTE — Progress Notes (Signed)
Patient ID: JANIA STEINKE, female   DOB: 08-03-1991, 28 y.o.   MRN: [id number] [place]) NOTE  MISCHELE DETTER is a 28 y.o. G2P0010 at [redacted]w[redacted]d by best clinical estimate who is admitted for sickle cell crisis.   Fetal presentation is cephalic. Length of Stay:  7  Days  Subjective: Painful contractions Patient reports the fetal movement as active. Patient reports uterine contraction  activity as regular, every 5 minutes. Patient reports  vaginal bleeding as none. Patient describes fluid per vagina as None.  Vitals:  Blood pressure 127/78, pulse 77, temperature 97.7 F (36.5 C), temperature source Oral, resp. rate 14, height 5\' 3"  (1.6 m), weight 77.1 kg, last menstrual period 05/15/2018, SpO2 98 %. Physical Examination:  General appearance - alert, well appearing, and in no distress and in mild to moderate distress Heart - normal rate and regular rhythm Abdomen - soft, nontender, nondistended Fundal Height:  size equals dates Cervical Exam: Evaluated by digital exam., Position: posterior, Dilation: 1cm, Thickness: 2 cm and Consistency: firm and found to be 1 cm/ 50%/-3 and fetal presentation is cephalic. Extremities: extremities normal, atraumatic, no cyanosis or edema and Homans sign is negative, no sign of DVT with DTRs 2+ bilaterally Membranes:intact  Fetal Monitoring:  Baseline: 140 bpm, Variability: Good {> 6 bpm), Accelerations: Reactive and Decelerations: Absent  Labs:  Results for orders placed or performed during the hospital encounter of 01/24/19 (from the past 24 hour(s))  Creatinine, serum   Collection Time: 02/01/19  6:23 AM  Result Value Ref Range   Creatinine, Ser 0.62 0.44 - 1.00 mg/dL   GFR calc non Af Amer >60 >60 mL/min   GFR calc Af Amer >60 >60 mL/min     Medications:  Scheduled . docusate sodium  100 mg Oral Daily  . enoxaparin (LOVENOX) injection  40 mg Subcutaneous Q24H  . feeding supplement (ENSURE ENLIVE)  237 mL Oral TID   . folic acid  1 mg Oral Daily  . HYDROmorphone   Intravenous Q4H  . morphine  30 mg Oral Q12H  . prenatal multivitamin  1 tablet Oral Daily  . sodium chloride flush  3 mL Intravenous Q12H   I have reviewed the patient's current medications.  ASSESSMENT: Patient Active Problem List   Diagnosis Date Noted  . Pregnancy 01/25/2019  . Sickle cell crisis ([place]) 01/25/2019  . Sickle cell pain crisis ([place]) 12/04/2018  . Alpha thalassemia silent carrier 12/04/2018  . 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 Pain in extremity   . Supervision of high risk pregnancy, antepartum 08/17/2018  . Chronic prescription opiate use 03/13/2018  . Chronic musculoskeletal pain 03/13/2018  . Vitamin D deficiency 01/13/2018  . Leukocytosis 08/21/2017  . 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 Hb-SS disease without crisis ([place]) 08/15/2016  . Anemia of chronic disease   . Systolic ejection murmur 67/89/3810  . Sickle cell anemia of mother during pregnancy [facility]) 05/16/2014    PLAN: Monitor for possible early labor  Emeterio Reeve 02/01/2019,9:57 AM

## 2019-02-02 DIAGNOSIS — Z3A37 37 weeks gestation of pregnancy: Secondary | ICD-10-CM

## 2019-02-02 LAB — BASIC METABOLIC PANEL
Anion gap: 6 (ref 5–15)
BUN: 8 mg/dL (ref 6–20)
CO2: 23 mmol/L (ref 22–32)
Calcium: 8.6 mg/dL — ABNORMAL LOW (ref 8.9–10.3)
Chloride: 105 mmol/L (ref 98–111)
Creatinine, Ser: 0.64 mg/dL (ref 0.44–1.00)
GFR calc Af Amer: >60 mL/min (ref >60)
GFR calc non Af Amer: >60 mL/min (ref >60)
Glucose, Bld: 111 mg/dL — ABNORMAL HIGH (ref 70–99)
Potassium: 3.5 mmol/L (ref 3.5–5.1)
Sodium: 134 mmol/L — ABNORMAL LOW (ref 135–145)

## 2019-02-02 LAB — CBC
HCT: 22.8 % — ABNORMAL LOW (ref 36.0–46.0)
Hemoglobin: 7.9 g/dL — ABNORMAL LOW (ref 12.0–15.0)
MCH: 31.6 pg (ref 26.0–34.0)
MCHC: 34.6 g/dL (ref 30.0–36.0)
MCV: 91.2 fL (ref 80.0–100.0)
Platelets: 375 10*3/uL (ref 150–400)
RBC: 2.5 MIL/uL — ABNORMAL LOW (ref 3.87–5.11)
RDW: 14.6 % (ref 11.5–15.5)
WBC: 12.7 10*3/uL — ABNORMAL HIGH (ref 4.0–10.5)
nRBC: 12.6 % — ABNORMAL HIGH (ref 0.0–0.2)

## 2019-02-02 NOTE — Discharge Summary (Signed)
Patient ID: Carmen [name] MRN: [id number] DOB/AGE: [birth year] 28 y.o.  Admit date: 01/24/2019 Discharge date: 02/02/2019  Admission Diagnoses:36.[redacted] weeks EGA, sickle cell crisis  Discharge Diagnoses: [redacted]w[redacted]d Sickle cell crisis  Prenatal Procedures: ultrasound  Consults: Neonatology, Maternal Fetal Medicine  Hospital Course:  This is a 28 y.o. G2P0010 with IUP at [redacted]w[redacted]d admitted for sickle cell crisis pain. She was followed by [facility] during the admission She was admitted with pain  She received IV Dilaudid PCA and oxycodone    Her cervical exam was unchanged from admission.  She was deemed stable for discharge to home with outpatient follow up.  Discharge Exam: Temp:  [98.4 F (36.9 C)-98.7 F (37.1 C)] 98.4 F (36.9 C) (04/03 0906) Pulse Rate:  [69-96] 96 (04/03 0906) Resp:  [11-18] 12 (04/03 0919) BP: (112-135)/(71-91) 113/91 (04/03 0906) SpO2:  [93 %-100 %] 93 % (04/03 0919) Physical Examination: CONSTITUTIONAL: Well-developed, well-nourished female in no acute distress.  HENT:  Normocephalic, atraumatic, External right and left ear normal. Oropharynx is clear and moist EYES: Conjunctivae and EOM are normal. Pupils are equal, round, and reactive to light. No scleral icterus.  NECK: Normal range of motion, supple, no masses SKIN: Skin is warm and dry. No rash noted. Not diaphoretic. No erythema. No pallor. [place]: Alert and oriented to person, place, and time. Normal reflexes, muscle tone coordination. No cranial nerve deficit noted. PSYCHIATRIC: Normal mood and affect. Normal behavior. Normal judgment and thought content. CARDIOVASCULAR: Normal heart rate noted, regular rhythm RESPIRATORY: Effort and breath sounds normal, no problems with respiration noted MUSCULOSKELETAL: Normal range of motion. No edema and no tenderness. 2+ distal pulses. ABDOMEN: Soft, nontender, nondistended, gravid. CERVIX: Dilation: Fingertip Effacement (%): 40 Cervical Position: Posterior Exam  by:: Dr. Roselie Awkward  Fetal monitoring: FHR: 140 bpm, Variability: moderate, Accelerations: Present, Decelerations: Absent  Uterine activity: none contractions per hour  Significant Diagnostic Studies:  Results for orders placed or performed during the hospital encounter of 01/24/19 (from the past 168 hour(s))  CBC   Collection Time: 01/26/19  7:01 PM  Result Value Ref Range   WBC 13.5 (H) 4.0 - 10.5 K/uL   RBC 2.46 (L) 3.87 - 5.11 MIL/uL   Hemoglobin 7.7 (L) 12.0 - 15.0 g/dL   HCT 22.7 (L) 36.0 - 46.0 %   MCV 92.3 80.0 - 100.0 fL   MCH 31.3 26.0 - 34.0 pg   MCHC 33.9 30.0 - 36.0 g/dL   RDW 15.9 (H) 11.5 - 15.5 %   Platelets 323 150 - 400 K/uL   nRBC 17.4 (H) 0.0 - 0.2 %  Basic metabolic panel   Collection Time: 01/26/19  7:01 PM  Result Value Ref Range   Sodium 137 135 - 145 mmol/L   Potassium 4.4 3.5 - 5.1 mmol/L   Chloride 102 98 - 111 mmol/L   CO2 24 22 - 32 mmol/L   Glucose, Bld 96 70 - 99 mg/dL   BUN 7 6 - 20 mg/dL   Creatinine, Ser 0.63 0.44 - 1.00 mg/dL   Calcium 9.4 8.9 - 10.3 mg/dL   GFR calc non Af Amer >60 >60 mL/min   GFR calc Af Amer >60 >60 mL/min   Anion gap 11 5 - 15  CBC with Differential/Platelet   Collection Time: 01/28/19  9:21 AM  Result Value Ref Range   WBC 15.4 (H) 4.0 - 10.5 K/uL   RBC 2.17 (L) 3.87 - 5.11 MIL/uL   Hemoglobin 6.9 (LL) 12.0 - 15.0 g/dL   HCT 20.0 (  L) 36.0 - 46.0 %   MCV 92.2 80.0 - 100.0 fL   MCH 31.8 26.0 - 34.0 pg   MCHC 34.5 30.0 - 36.0 g/dL   RDW 16.3 (H) 11.5 - 15.5 %   Platelets 293 150 - 400 K/uL   nRBC 16.1 (H) 0.0 - 0.2 %   Neutrophils Relative % 71 %   Neutro Abs 11.4 (H) 1.7 - 7.7 K/uL   Band Neutrophils 3 %   Lymphocytes Relative 13 %   Lymphs Abs 2.0 0.7 - 4.0 K/uL   Monocytes Relative 8 %   Monocytes Absolute 1.2 (H) 0.1 - 1.0 K/uL   Eosinophils Relative 0 %   Eosinophils Absolute 0.0 0.0 - 0.5 K/uL   Basophils Relative 0 %   Basophils Absolute 0.0 0.0 - 0.1 K/uL   nRBC 26 (H) 0 /100 WBC   Metamyelocytes  Relative 5 %   Abs Immature Granulocytes 0.80 (H) 0.00 - 0.07 K/uL   Tear Drop Cells PRESENT    Pappenheimer Bodies PRESENT    Polychromasia PRESENT    Sickle Cells PRESENT    Target Cells PRESENT   Comprehensive metabolic panel   Collection Time: 01/28/19  9:21 AM  Result Value Ref Range   Sodium 134 (L) 135 - 145 mmol/L   Potassium 4.0 3.5 - 5.1 mmol/L   Chloride 104 98 - 111 mmol/L   CO2 24 22 - 32 mmol/L   Glucose, Bld 87 70 - 99 mg/dL   BUN 9 6 - 20 mg/dL   Creatinine, Ser 0.59 0.44 - 1.00 mg/dL   Calcium 8.6 (L) 8.9 - 10.3 mg/dL   Total Protein 6.4 (L) 6.5 - 8.1 g/dL   Albumin 2.3 (L) 3.5 - 5.0 g/dL   AST 52 (H) 15 - 41 U/L   ALT 25 0 - 44 U/L   Alkaline Phosphatase 363 (H) 38 - 126 U/L   Total Bilirubin 2.4 (H) 0.3 - 1.2 mg/dL   GFR calc non Af Amer >60 >60 mL/min   GFR calc Af Amer >60 >60 mL/min   Anion gap 6 5 - 15  Prepare RBC   Collection Time: 01/28/19 11:25 AM  Result Value Ref Range   Order Confirmation      ORDER PROCESSED BY BLOOD BANK Performed at [facility], [address] [place], [place] [address]   CBC   Collection Time: 01/29/19  5:19 AM  Result Value Ref Range   WBC 15.2 (H) 4.0 - 10.5 K/uL   RBC 2.55 (L) 3.87 - 5.11 MIL/uL   Hemoglobin 8.5 (L) 12.0 - 15.0 g/dL   HCT 22.9 (L) 36.0 - 46.0 %   MCV 89.8 80.0 - 100.0 fL   MCH 33.3 26.0 - 34.0 pg   MCHC 37.1 (H) 30.0 - 36.0 g/dL   RDW 17.1 (H) 11.5 - 15.5 %   Platelets 354 150 - 400 K/uL   nRBC 19.5 (H) 0.0 - 0.2 %  Type and screen [place]   Collection Time: 01/30/19  7:17 AM  Result Value Ref Range   ABO/RH(D) O POS    Antibody Screen NEG    Sample Expiration      02/02/2019 Performed at [facility], [place] [address] [place], [place] [address]    Unit Number [id number]    Blood Component Type RED CELLS,LR    Unit division 00    Status of Unit ALLOCATED    Transfusion Status OK TO TRANSFUSE  Crossmatch Result COMPATIBLE    Unit Number  [id number]    Blood Component Type RED CELLS,LR    Unit division 00    Status of Unit ALLOCATED    Donor AG Type      NEGATIVE FOR E ANTIGEN NEGATIVE FOR S ANTIGEN NEGATIVE FOR KELL ANTIGEN   Transfusion Status OK TO TRANSFUSE    Crossmatch Result COMPATIBLE   BPAM RBC   Collection Time: 01/30/19  7:17 AM  Result Value Ref Range   Blood Product Unit Number [id number]    Unit Type and Rh 5100    Blood Product Expiration Date 630160109323    Blood Product Unit Number [id number]    Unit Type and Rh 5100    Blood Product Expiration Date [id number]   CBC   Collection Time: 01/31/19  5:41 AM  Result Value Ref Range   WBC 11.0 (H) 4.0 - 10.5 K/uL   RBC 2.53 (L) 3.87 - 5.11 MIL/uL   Hemoglobin 7.9 (L) 12.0 - 15.0 g/dL   HCT 23.3 (L) 36.0 - 46.0 %   MCV 92.1 80.0 - 100.0 fL   MCH 31.2 26.0 - 34.0 pg   MCHC 33.9 30.0 - 36.0 g/dL   RDW 16.1 (H) 11.5 - 15.5 %   Platelets 338 150 - 400 K/uL   nRBC 23.4 (H) 0.0 - 0.2 %  Basic metabolic panel   Collection Time: 01/31/19  5:41 AM  Result Value Ref Range   Sodium 134 (L) 135 - 145 mmol/L   Potassium 3.8 3.5 - 5.1 mmol/L   Chloride 102 98 - 111 mmol/L   CO2 23 22 - 32 mmol/L   Glucose, Bld 82 70 - 99 mg/dL   BUN 12 6 - 20 mg/dL   Creatinine, Ser 0.63 0.44 - 1.00 mg/dL   Calcium 8.9 8.9 - 10.3 mg/dL   GFR calc non Af Amer >60 >60 mL/min   GFR calc Af Amer >60 >60 mL/min   Anion gap 9 5 - 15  Creatinine, serum   Collection Time: 02/01/19  6:23 AM  Result Value Ref Range   Creatinine, Ser 0.62 0.44 - 1.00 mg/dL   GFR calc non Af Amer >60 >60 mL/min   GFR calc Af Amer >60 >60 mL/min  Type and screen [place]   Collection Time: 02/02/19  8:08 AM  Result Value Ref Range   ABO/RH(D) O POS    Antibody Screen NEG    Sample Expiration      02/05/2019 Performed at [facility], [address], [place], [place] [address]     Discharge Condition: Stable  Disposition: Discharge disposition: 01-Home  or Self Care        Discharge Instructions    Discharge patient   Complete by:  As directed    Discharge disposition:  01-Home or Self Care   Discharge patient date:  02/02/2019     Allergies as of 02/02/2019   No Known Allergies     Medication List    TAKE these medications   aspirin EC 81 MG tablet Take 1 tablet (81 mg total) by mouth daily.   Ensure Healthy Mom Liqd Drink 1 pouch BID What changed:    how much to take  how to take this  when to take this  additional instructions   ergocalciferol 1.25 MG (50000 UT) capsule Commonly known as:  VITAMIN D2 Take 1 capsule (50,000 Units total) by mouth once a week.   folic acid  1 MG tablet Commonly known as:  FOLVITE Take 1 tablet (1 mg total) by mouth daily.   morphine 30 MG 12 hr tablet Commonly known as:  MS CONTIN Take 1 tablet (30 mg total) by mouth every 12 (twelve) hours for 30 days. Every 12 hours. Not every 8. What changed:  additional instructions   oxyCODONE 5 MG immediate release tablet Commonly known as:  Oxy IR/ROXICODONE Take 1 tablet (5 mg total) by mouth every 6 (six) hours as needed for up to 15 days for severe pain.   PrePLUS 27-1 MG Tabs Take 1 tablet by mouth daily.      Follow-up [facility] [place] Follow up in 1 week(s).   Specialty:  Obstetrics and Gynecology Contact information: [place] [phone]          Signed: Emeterio Reeve M.D. 02/02/2019, 10:27 AM

## 2019-02-02 NOTE — Discharge Instructions (Signed)
Sickle Cell Anemia, Adult °Sickle cell anemia is a condition where your red blood cells are shaped like sickles. Red blood cells carry oxygen through the body. Sickle-shaped cells do not live as long as normal red blood cells. They also clump together and block blood from flowing through the blood vessels. This prevents the body from getting enough oxygen. Sickle cell anemia causes organ damage and pain. It also increases the risk of infection. °Follow these instructions at home: °Medicines °· Take over-the-counter and prescription medicines only as told by your doctor. °· If you were prescribed an antibiotic medicine, take it as told by your doctor. Do not stop taking the antibiotic even if you start to feel better. °· If you develop a fever, do not take medicines to lower the fever right away. Tell your doctor about the fever. °Managing pain, stiffness, and swelling °· Try these methods to help with pain: °? Use a heating pad. °? Take a warm bath. °? Distract yourself, such as by watching TV. °Eating and drinking °· Drink enough fluid to keep your pee (urine) clear or pale yellow. Drink more in hot weather and during exercise. °· Limit or avoid alcohol. °· Eat a healthy diet. Eat plenty of fruits, vegetables, whole grains, and lean protein. °· Take vitamins and supplements as told by your doctor. °Traveling °· When traveling, keep these with you: °? Your medical information. °? The names of your doctors. °? Your medicines. °· If you need to take an airplane, talk to your doctor first. °Activity °· Rest often. °· Avoid exercises that make your heart beat much faster, such as jogging. °General instructions °· Do not use products that have nicotine or tobacco, such as cigarettes and e-cigarettes. If you need help quitting, ask your doctor. °· Consider wearing a medical alert bracelet. °· Avoid being in high places (high altitudes), such as mountains. °· Avoid very hot or cold temperatures. °· Avoid places where the  temperature changes a lot. °· Keep all follow-up visits as told by your doctor. This is important. °Contact a doctor if: °· A joint hurts. °· Your feet or hands hurt or swell. °· You feel tired (fatigued). °Get help right away if: °· You have symptoms of infection. These include: °? Fever. °? Chills. °? Being very tired. °? Irritability. °? Poor eating. °? Throwing up (vomiting). °· You feel dizzy or faint. °· You have new stomach pain, especially on the left side. °· You have a an erection (priapism) that lasts more than 4 hours. °· You have numbness in your arms or legs. °· You have a hard time moving your arms or legs. °· You have trouble talking. °· You have pain that does not go away when you take medicine. °· You are short of breath. °· You are breathing fast. °· You have a long-term cough. °· You have pain in your chest. °· You have a bad headache. °· You have a stiff neck. °· Your stomach looks bloated even though you did not eat much. °· Your skin is pale. °· You suddenly cannot see well. °Summary °· Sickle cell anemia is a condition where your red blood cells are shaped like sickles. °· Follow your doctor's advice on ways to manage pain, food to eat, activities to do, and steps to take for safe travel. °· Get medical help right away if you have any signs of infection, such as a fever. °This information is not intended to replace advice given to you by your   health care provider. Make sure you discuss any questions you have with your health care provider. Document Released: 08/08/2013 Document Revised: 11/23/2016 Document Reviewed: 11/23/2016 Elsevier Interactive Patient Education  2019 [employer].  Vaginal Delivery  Vaginal delivery means that you give birth by pushing your baby out of your birth canal (vagina). A team of health care providers will help you before, during, and after vaginal delivery. Birth experiences are unique for every woman and every pregnancy, and birth experiences vary  depending on where you choose to give birth. What happens when I arrive at the birth center or hospital? Once you are in labor and have been admitted into the hospital or birth center, your health care provider may:  Review your pregnancy history and any concerns that you have.  Insert an IV into one of your veins. This may be used to give you fluids and medicines.  Check your blood pressure, pulse, temperature, and heart rate (vital signs).  Check whether your bag of water (amniotic sac) has broken (ruptured).  Talk with you about your birth plan and discuss pain control options. Monitoring Your health care provider may monitor your contractions (uterine monitoring) and your baby's heart rate (fetal monitoring). You may need to be monitored:  Often, but not continuously (intermittently).  All the time or for long periods at a time (continuously). Continuous monitoring may be needed if: ? You are taking certain medicines, such as medicine to relieve pain or make your contractions stronger. ? You have pregnancy or labor complications. Monitoring may be done by:  Placing a special stethoscope or a handheld monitoring device on your abdomen to check your baby's heartbeat and to check for contractions.  Placing monitors on your abdomen (external monitors) to record your baby's heartbeat and the frequency and length of contractions.  Placing monitors inside your uterus through your vagina (internal monitors) to record your baby's heartbeat and the frequency, length, and strength of your contractions. Depending on the type of monitor, it may remain in your uterus or on your baby's head until birth.  Telemetry. This is a type of continuous monitoring that can be done with external or internal monitors. Instead of having to stay in bed, you are able to move around during telemetry. Physical exam Your health care provider may perform frequent physical exams. This may include:  Checking how  and where your baby is positioned in your uterus.  Checking your cervix to determine: ? Whether it is thinning out (effacing). ? Whether it is opening up (dilating). What happens during labor and delivery?  Normal labor and delivery is divided into the following three stages: Stage 1  This is the longest stage of labor.  This stage can last for hours or days.  Throughout this stage, you will feel contractions. Contractions generally feel mild, infrequent, and irregular at first. They get stronger, more frequent (about every 2-3 minutes), and more regular as you move through this stage.  This stage ends when your cervix is completely dilated to 4 inches (10 cm) and completely effaced. Stage 2  This stage starts once your cervix is completely effaced and dilated and lasts until the delivery of your baby.  This stage may last from 20 minutes to 2 hours.  This is the stage where you will feel an urge to push your baby out of your vagina.  You may feel stretching and burning pain, especially when the widest part of your baby's head passes through the vaginal opening (crowning).  Once your baby is delivered, the umbilical cord will be clamped and cut. This usually occurs after waiting a period of 1-2 minutes after delivery.  Your baby will be placed on your bare chest (skin-to-skin contact) in an upright position and covered with a warm blanket. Watch your baby for feeding cues, like rooting or sucking, and help the baby to your breast for his or her first feeding. Stage 3  This stage starts immediately after the birth of your baby and ends after you deliver the placenta.  This stage may take anywhere from 5 to 30 minutes.  After your baby has been delivered, you will feel contractions as your body expels the placenta and your uterus contracts to control bleeding. What can I expect after labor and delivery?  After labor is over, you and your baby will be monitored closely until you  are ready to go home to ensure that you are both healthy. Your health care team will teach you how to care for yourself and your baby.  You and your baby will stay in the same room (rooming in) during your hospital stay. This will encourage early bonding and successful breastfeeding.  You may continue to receive fluids and medicines through an IV.  Your uterus will be checked and massaged regularly (fundal massage).  You will have some soreness and pain in your abdomen, vagina, and the area of skin between your vaginal opening and your anus (perineum).  If an incision was made near your vagina (episiotomy) or if you had some vaginal tearing during delivery, cold compresses may be placed on your episiotomy or your tear. This helps to reduce pain and swelling.  You may be given a squirt bottle to use instead of wiping when you go to the bathroom. To use the squirt bottle, follow these steps: ? Before you urinate, fill the squirt bottle with warm water. Do not use hot water. ? After you urinate, while you are sitting on the toilet, use the squirt bottle to rinse the area around your urethra and vaginal opening. This rinses away any urine and blood. ? Fill the squirt bottle with clean water every time you use the bathroom.  It is normal to have vaginal bleeding after delivery. Wear a sanitary pad for vaginal bleeding and discharge. Summary  Vaginal delivery means that you will give birth by pushing your baby out of your birth canal (vagina).  Your health care provider may monitor your contractions (uterine monitoring) and your baby's heart rate (fetal monitoring).  Your health care provider may perform a physical exam.  Normal labor and delivery is divided into three stages.  After labor is over, you and your baby will be monitored closely until you are ready to go home. This information is not intended to replace advice given to you by your health care provider. Make sure you discuss any  questions you have with your health care provider. Document Released: 07/27/2008 Document Revised: 11/22/2017 Document Reviewed: 11/22/2017 Elsevier Interactive Patient Education  2019 [employer].

## 2019-02-02 NOTE — Progress Notes (Signed)
Pt discharged to home with significant other.  Condition stable.  Pt ambulated to car with Donata Clay, NT.  No equipment for home ordered at discharge.

## 2019-02-02 NOTE — Progress Notes (Signed)
Subjective: Carmen [name], a 28 year old female with a medical history significant for sickle cell anemia, chronic pain syndrome, opiate dependence in 3rd trimester of pregnancy was admitted in sickle cell pain crisis.   Patient says that pain has improved overnight. Pain intensity is 5-6/10 primarily to low back and lower extremities. Pain goal is 5/10. Patient ambulating in halls without assistance.   Patient is followed by obstetrics who says that she is ready for discharge. Patient is not complaining of abdominal cramping or vaginal bleeding. Patient endorses frequent fetal movement.   She denies chest pain, shortness of breath, persistent cough, nausea, vomiting, or diarrhea.   Objective:  Vital signs in last 24 hours:  Vitals:   02/02/19 0628 02/02/19 0821 02/02/19 0906 02/02/19 0919  BP: 135/74  (!) 113/91   Pulse: 74  96   Resp: 17 18 11 12   Temp: 98.4 F (36.9 C)  98.4 F (36.9 C)   TempSrc: Oral  Oral   SpO2: 100% 93% 97% 93%  Weight:      Height:        Intake/Output from previous day:   Intake/Output Summary (Last 24 hours) at 02/02/2019 1056 Last data filed at 02/01/2019 1511 Gross per 24 hour  Intake -  Output 900 ml  Net -900 ml   Physical Exam Constitutional:      General: She is not in acute distress. Neck:     Musculoskeletal: Normal range of motion.  Cardiovascular:     Rate and Rhythm: Normal rate and regular rhythm.     Pulses: Normal pulses.     Heart sounds: Normal heart sounds.  Abdominal:     General: Bowel sounds are normal.     Palpations: Abdomen is soft.  Musculoskeletal:     Right lower leg: 2+ Pitting Edema present.     Left lower leg: 2+ Pitting Edema present.  Neurological:     General: No focal deficit present.     Mental Status: Mental status is at baseline.  Psychiatric:        Mood and Affect: Mood normal.        Thought Content: Thought content normal.        Judgment: Judgment normal.     Lab Results:  Basic Metabolic  Panel:    Component Value Date/Time   NA 134 (L) 01/31/2019 0541   NA 139 08/17/2018 0935   K 3.8 01/31/2019 0541   CL 102 01/31/2019 0541   CO2 23 01/31/2019 0541   BUN 12 01/31/2019 0541   BUN 9 08/17/2018 0935   CREATININE 0.62 02/01/2019 0623   GLUCOSE 82 01/31/2019 0541   CALCIUM 8.9 01/31/2019 0541   CBC:    Component Value Date/Time   WBC 11.0 (H) 01/31/2019 0541   HGB 7.9 (L) 01/31/2019 0541   HGB 7.5 (L) 11/29/2018 0841   HCT 23.3 (L) 01/31/2019 0541   HCT 21.8 (L) 11/29/2018 0841   PLT 338 01/31/2019 0541   PLT 582 (H) 11/29/2018 0841   MCV 92.1 01/31/2019 0541   MCV 92 11/29/2018 0841   NEUTROABS 11.4 (H) 01/28/2019 0921   NEUTROABS 7.9 (H) 09/15/2018 0933   LYMPHSABS 2.0 01/28/2019 0921   LYMPHSABS 2.4 09/15/2018 0933   MONOABS 1.2 (H) 01/28/2019 0921   EOSABS 0.0 01/28/2019 0921   EOSABS 0.1 09/15/2018 0933   BASOSABS 0.0 01/28/2019 0921   BASOSABS 0.0 09/15/2018 0933    Recent Results (from the past 240 hour(s))  Culture, beta strep (  group b only)     Status: None   Collection Time: 01/25/19  3:50 PM  Result Value Ref Range Status   Specimen Description VAGINAL/RECTAL  Final   Special Requests NONE  Final   Culture   Final    PATIENT RESULTS CORRECTED WITH MIX, SUGGESTS FACTOR DEFICIENCY CULTURE REINCUBATED FOR BETTER GROWTH CORRECTED ON 03/27 AT 8333: PREVIOUSLY REPORTED AS NO GROUP B STREP (S.AGALACTIAE) ISOLATED PREVIOUSLY REPORTED AS: NO GROUP B STREP (S.AGALACTIAE) ISOLATED CORRECTED RESULTS CALLED TO: RN JEN COOK GROUP B STREP(S.AGALACTIAE)ISOLATED TESTING AGAINST S. AGALACTIAE NOT ROUTINELY PERFORMED DUE TO PREDICTABILITY OF AMP/PEN/VAN SUSCEPTIBILITY. CRITICAL RESULT CALLED TO, READ BACK BY AND VERIFIED WITH: RN F MORRIS [phone] AT 750 BY CM Performed at [facility], [place] [address], [place], [place] [address]    Report Status 01/27/2019 FINAL  Final    Studies/Results: No results found.  Medications: Scheduled Meds: . docusate  sodium  100 mg Oral Daily  . enoxaparin (LOVENOX) injection  40 mg Subcutaneous Q24H  . feeding supplement (ENSURE ENLIVE)  237 mL Oral TID  . folic acid  1 mg Oral Daily  . HYDROmorphone   Intravenous Q4H  . morphine  30 mg Oral Q12H  . prenatal multivitamin  1 tablet Oral Daily  . sodium chloride flush  3 mL Intravenous Q12H   Continuous Infusions: . diphenhydrAMINE    . lactated ringers 100 mL/hr at 02/02/19 0116   PRN Meds:.acetaminophen, calcium carbonate, diphenhydrAMINE **OR** diphenhydrAMINE, naloxone **AND** sodium chloride flush, ondansetron (ZOFRAN) IV, ondansetron **OR** ondansetron (ZOFRAN) IV, oxyCODONE, polyethylene glycol, zolpidem  Assessment/Plan: Principal Problem:   Sickle cell pain crisis ([place]) Active Problems:   Leukocytosis   Chronic musculoskeletal pain   Pregnancy   Sickle cell crisis (HCC)  Sickle cell anemia with pain crisis:  Sickle cell services will sign off. Pt is aware of all upcoming appointments. She will resume opiate pain medication at home for further pain control.  Patient has been discharged home and will follow up with obstetrics in 1 week.   She will be discharged in a hemodynamically stable condition.    Code Status: Full Code Family Communication: N/A Disposition Plan: Not yet ready for discharge     [place], MSN, FNP-C Patient [facility] [address] [place], [place] [address] [phone]  If 7PM-7AM, please contact night-coverage.  02/02/2019, 10:56 AM  LOS: 8 days

## 2019-02-03 LAB — TYPE AND SCREEN
ABO/RH(D): O POS
Antibody Screen: NEGATIVE
Unit division: 0
Unit division: 0

## 2019-02-03 LAB — BPAM RBC
Blood Product Expiration Date: 202005012359
Blood Product Expiration Date: 202005092359
Unit Type and Rh: 5100
Unit Type and Rh: 5100

## 2019-02-04 ENCOUNTER — Inpatient Hospital Stay (HOSPITAL_COMMUNITY): Payer: Medicaid Other | Admitting: Anesthesiology

## 2019-02-04 ENCOUNTER — Other Ambulatory Visit: Payer: Self-pay

## 2019-02-04 ENCOUNTER — Encounter (HOSPITAL_COMMUNITY): Payer: Self-pay

## 2019-02-04 ENCOUNTER — Inpatient Hospital Stay (HOSPITAL_COMMUNITY)
Admission: AD | Admit: 2019-02-04 | Discharge: 2019-02-10 | DRG: 786 | Disposition: A | Discharge status: Home / Self Care | Payer: Medicaid Other | Attending: Obstetrics and Gynecology | Admitting: Obstetrics and Gynecology

## 2019-02-04 DIAGNOSIS — O99284 Endocrine, nutritional and metabolic diseases complicating childbirth: Secondary | ICD-10-CM | POA: Diagnosis present

## 2019-02-04 DIAGNOSIS — D563 Thalassemia minor: Secondary | ICD-10-CM | POA: Diagnosis present

## 2019-02-04 DIAGNOSIS — G894 Chronic pain syndrome: Secondary | ICD-10-CM | POA: Diagnosis present

## 2019-02-04 DIAGNOSIS — Z79891 Long term (current) use of opiate analgesic: Secondary | ICD-10-CM | POA: Diagnosis not present

## 2019-02-04 DIAGNOSIS — E871 Hypo-osmolality and hyponatremia: Secondary | ICD-10-CM | POA: Diagnosis present

## 2019-02-04 DIAGNOSIS — D5700 Hb-SS disease with crisis, unspecified: Secondary | ICD-10-CM | POA: Diagnosis present

## 2019-02-04 DIAGNOSIS — O99019 Anemia complicating pregnancy, unspecified trimester: Secondary | ICD-10-CM | POA: Diagnosis present

## 2019-02-04 DIAGNOSIS — O9902 Anemia complicating childbirth: Secondary | ICD-10-CM | POA: Diagnosis present

## 2019-02-04 DIAGNOSIS — Z98891 History of uterine scar from previous surgery: Secondary | ICD-10-CM

## 2019-02-04 DIAGNOSIS — Z3A37 37 weeks gestation of pregnancy: Secondary | ICD-10-CM

## 2019-02-04 DIAGNOSIS — D571 Sickle-cell disease without crisis: Secondary | ICD-10-CM | POA: Diagnosis not present

## 2019-02-04 DIAGNOSIS — O9952 Diseases of the respiratory system complicating childbirth: Secondary | ICD-10-CM | POA: Diagnosis present

## 2019-02-04 DIAGNOSIS — O4292 Full-term premature rupture of membranes, unspecified as to length of time between rupture and onset of labor: Principal | ICD-10-CM | POA: Diagnosis present

## 2019-02-04 DIAGNOSIS — O4202 Full-term premature rupture of membranes, onset of labor within 24 hours of rupture: Secondary | ICD-10-CM | POA: Diagnosis not present

## 2019-02-04 DIAGNOSIS — D638 Anemia in other chronic diseases classified elsewhere: Secondary | ICD-10-CM | POA: Diagnosis not present

## 2019-02-04 DIAGNOSIS — O99013 Anemia complicating pregnancy, third trimester: Secondary | ICD-10-CM | POA: Diagnosis not present

## 2019-02-04 DIAGNOSIS — O99354 Diseases of the nervous system complicating childbirth: Secondary | ICD-10-CM | POA: Diagnosis present

## 2019-02-04 DIAGNOSIS — J9811 Atelectasis: Secondary | ICD-10-CM | POA: Diagnosis not present

## 2019-02-04 DIAGNOSIS — O0990 Supervision of high risk pregnancy, unspecified, unspecified trimester: Secondary | ICD-10-CM

## 2019-02-04 DIAGNOSIS — O99824 Streptococcus B carrier state complicating childbirth: Secondary | ICD-10-CM | POA: Diagnosis present

## 2019-02-04 DIAGNOSIS — R079 Chest pain, unspecified: Secondary | ICD-10-CM

## 2019-02-04 LAB — CBC
HCT: 24.8 % — ABNORMAL LOW (ref 36.0–46.0)
Hemoglobin: 8.5 g/dL — ABNORMAL LOW (ref 12.0–15.0)
MCH: 32.3 pg (ref 26.0–34.0)
MCHC: 34.3 g/dL (ref 30.0–36.0)
MCV: 94.3 fL (ref 80.0–100.0)
Platelets: 400 10*3/uL (ref 150–400)
RBC: 2.63 MIL/uL — ABNORMAL LOW (ref 3.87–5.11)
RDW: 15.3 % (ref 11.5–15.5)
WBC: 15.2 10*3/uL — ABNORMAL HIGH (ref 4.0–10.5)
nRBC: 18.2 % — ABNORMAL HIGH (ref 0.0–0.2)

## 2019-02-04 LAB — POCT FERN TEST: POCT Fern Test: POSITIVE

## 2019-02-04 LAB — TYPE AND SCREEN
ABO/RH(D): O POS
Antibody Screen: NEGATIVE

## 2019-02-04 MED ORDER — DIPHENHYDRAMINE HCL 50 MG/ML IJ SOLN
12.5000 mg | INTRAMUSCULAR | Status: DC | PRN
  Administered 2019-02-05: 05:00:00 12.5 mg via INTRAVENOUS
  Filled 2019-02-04: qty 1

## 2019-02-04 MED ORDER — FENTANYL-BUPIVACAINE-NACL 0.5-0.125-0.9 MG/250ML-% EP SOLN
12.0000 mL/h | EPIDURAL | Status: DC | PRN
  Administered 2019-02-05: 05:00:00 12 mL/h via EPIDURAL
  Filled 2019-02-04: qty 250

## 2019-02-04 MED ORDER — SODIUM CHLORIDE 0.9 % IV SOLN
5.0000 10*6.[IU] | Freq: Once | INTRAVENOUS | Status: AC
  Administered 2019-02-04: 16:00:00 5 10*6.[IU] via INTRAVENOUS
  Filled 2019-02-04: qty 5

## 2019-02-04 MED ORDER — NALOXONE HCL 0.4 MG/ML IJ SOLN: 0.4000 mg | INTRAMUSCULAR | Status: DC | PRN

## 2019-02-04 MED ORDER — LIDOCAINE HCL (PF) 1 % IJ SOLN
INTRAMUSCULAR | Status: DC | PRN
  Administered 2019-02-04: 6 mL via EPIDURAL

## 2019-02-04 MED ORDER — LACTATED RINGERS IV SOLN
INTRAVENOUS | Status: DC
  Administered 2019-02-04 – 2019-02-05 (×2): via INTRAVENOUS

## 2019-02-04 MED ORDER — SODIUM CHLORIDE 0.9% FLUSH: 9.0000 mL | INTRAVENOUS | Status: DC | PRN

## 2019-02-04 MED ORDER — SOD CITRATE-CITRIC ACID 500-334 MG/5ML PO SOLN
30.0000 mL | ORAL | Status: DC | PRN
  Administered 2019-02-05: 13:00:00 30 mL via ORAL
  Filled 2019-02-04: qty 15

## 2019-02-04 MED ORDER — HYDROMORPHONE 1 MG/ML IV SOLN
INTRAVENOUS | Status: DC
  Administered 2019-02-04: 20:00:00 via INTRAVENOUS
  Administered 2019-02-04: 3.6 mg via INTRAVENOUS
  Administered 2019-02-05: 4.8 mg via INTRAVENOUS
  Administered 2019-02-05: 1.8 mg via INTRAVENOUS
  Administered 2019-02-05: 12.6 mL via INTRAVENOUS
  Administered 2019-02-05: 5 mg via INTRAVENOUS
  Administered 2019-02-05: 15:00:00 via INTRAVENOUS
  Administered 2019-02-05: 3 mg via INTRAVENOUS
  Administered 2019-02-05: 12:00:00 via INTRAVENOUS
  Administered 2019-02-06: 3.6 mg via INTRAVENOUS
  Administered 2019-02-06: 2.4 mL via INTRAVENOUS
  Administered 2019-02-06: 9.6 mg via INTRAVENOUS
  Administered 2019-02-06: 16:00:00 via INTRAVENOUS
  Administered 2019-02-06: 0.6 mg via INTRAVENOUS
  Administered 2019-02-07: 15.6 mg via INTRAVENOUS
  Administered 2019-02-07: 11:00:00 via INTRAVENOUS
  Administered 2019-02-07: 6.6 mL via INTRAVENOUS
  Administered 2019-02-07: 8.2 mg via INTRAVENOUS
  Administered 2019-02-07: 2.4 mg via INTRAVENOUS
  Administered 2019-02-08: 4.2 mg via INTRAVENOUS
  Administered 2019-02-08: 7.8 mg via INTRAVENOUS
  Administered 2019-02-08: via INTRAVENOUS
  Administered 2019-02-08: 7.8 mg via INTRAVENOUS
  Administered 2019-02-08: 18:00:00 via INTRAVENOUS
  Administered 2019-02-08: 6 mg via INTRAVENOUS
  Administered 2019-02-08: 7.8 mg via INTRAVENOUS
  Administered 2019-02-08: 1.2 mg via INTRAVENOUS
  Administered 2019-02-09: 25 mg via INTRAVENOUS
  Administered 2019-02-09: 4.8 mg via INTRAVENOUS
  Administered 2019-02-09: 7.8 mg via INTRAVENOUS
  Administered 2019-02-09: 64.2 mg via INTRAVENOUS
  Administered 2019-02-09: 6.6 mg via INTRAVENOUS
  Administered 2019-02-09: 7.2 mg via INTRAVENOUS
  Administered 2019-02-09: 3 mg via INTRAVENOUS
  Administered 2019-02-10: 6.53 mg via INTRAVENOUS
  Administered 2019-02-10: 01:00:00 via INTRAVENOUS
  Filled 2019-02-04 (×8): qty 25

## 2019-02-04 MED ORDER — LACTATED RINGERS IV SOLN
500.0000 mL | Freq: Once | INTRAVENOUS | Status: AC
  Administered 2019-02-04: 500 mL via INTRAVENOUS

## 2019-02-04 MED ORDER — LACTATED RINGERS AMNIOINFUSION
INTRAVENOUS | Status: DC
  Administered 2019-02-04 – 2019-02-05 (×2): via INTRAUTERINE

## 2019-02-04 MED ORDER — OXYCODONE-ACETAMINOPHEN 5-325 MG PO TABS: 2.0000 | ORAL_TABLET | ORAL | Status: DC | PRN

## 2019-02-04 MED ORDER — PHENYLEPHRINE 40 MCG/ML (10ML) SYRINGE FOR IV PUSH (FOR BLOOD PRESSURE SUPPORT): 80.0000 ug | PREFILLED_SYRINGE | INTRAVENOUS | Status: DC | PRN

## 2019-02-04 MED ORDER — OXYTOCIN 40 UNITS IN NORMAL SALINE INFUSION - SIMPLE MED
1.0000 m[IU]/min | INTRAVENOUS | Status: DC
  Administered 2019-02-04: 18:00:00 2 m[IU]/min via INTRAVENOUS

## 2019-02-04 MED ORDER — ONDANSETRON HCL 4 MG/2ML IJ SOLN
4.0000 mg | Freq: Four times a day (QID) | INTRAMUSCULAR | Status: DC | PRN
  Administered 2019-02-05: 14:00:00 4 mg via INTRAVENOUS

## 2019-02-04 MED ORDER — PENICILLIN G 3 MILLION UNITS IVPB - SIMPLE MED
3.0000 10*6.[IU] | INTRAVENOUS | Status: DC
  Administered 2019-02-04 – 2019-02-05 (×4): 3 10*6.[IU] via INTRAVENOUS
  Filled 2019-02-04 (×4): qty 100

## 2019-02-04 MED ORDER — ONDANSETRON HCL 4 MG/2ML IJ SOLN: 4.0000 mg | Freq: Four times a day (QID) | INTRAMUSCULAR | Status: DC | PRN

## 2019-02-04 MED ORDER — LACTATED RINGERS IV SOLN
500.0000 mL | INTRAVENOUS | Status: DC | PRN
  Administered 2019-02-04: 500 mL via INTRAVENOUS

## 2019-02-04 MED ORDER — OXYTOCIN 40 UNITS IN NORMAL SALINE INFUSION - SIMPLE MED
2.5000 [IU]/h | INTRAVENOUS | Status: DC
  Filled 2019-02-04: qty 1000

## 2019-02-04 MED ORDER — OXYTOCIN BOLUS FROM INFUSION: 500.0000 mL | Freq: Once | INTRAVENOUS | Status: DC

## 2019-02-04 MED ORDER — OXYCODONE-ACETAMINOPHEN 5-325 MG PO TABS: 1.0000 | ORAL_TABLET | ORAL | Status: DC | PRN

## 2019-02-04 MED ORDER — DIPHENHYDRAMINE HCL 50 MG/ML IJ SOLN: 12.5000 mg | INTRAMUSCULAR | Status: DC | PRN

## 2019-02-04 MED ORDER — EPHEDRINE 5 MG/ML INJ: 10.0000 mg | INTRAVENOUS | Status: DC | PRN

## 2019-02-04 MED ORDER — DIPHENHYDRAMINE HCL 25 MG PO CAPS: 25.0000 mg | ORAL_CAPSULE | ORAL | Status: DC | PRN

## 2019-02-04 MED ORDER — SODIUM CHLORIDE (PF) 0.9 % IJ SOLN
INTRAMUSCULAR | Status: DC | PRN
  Administered 2019-02-04: 14 mL/h via EPIDURAL

## 2019-02-04 MED ORDER — FENTANYL-BUPIVACAINE-NACL 0.5-0.125-0.9 MG/250ML-% EP SOLN
EPIDURAL | Status: AC
  Filled 2019-02-04: qty 250

## 2019-02-04 MED ORDER — SODIUM CHLORIDE 0.9 % IV SOLN
25.0000 mg | INTRAVENOUS | Status: DC | PRN
  Filled 2019-02-04: qty 0.5

## 2019-02-04 MED ORDER — FENTANYL-BUPIVACAINE-NACL 0.5-0.125-0.9 MG/250ML-% EP SOLN: 12.0000 mL/h | EPIDURAL | Status: DC | PRN

## 2019-02-04 MED ORDER — ACETAMINOPHEN 325 MG PO TABS: 650.0000 mg | ORAL_TABLET | ORAL | Status: DC | PRN

## 2019-02-04 MED ORDER — FENTANYL CITRATE (PF) 100 MCG/2ML IJ SOLN
100.0000 ug | INTRAMUSCULAR | Status: DC | PRN
  Administered 2019-02-04: 15:00:00 100 ug via INTRAVENOUS
  Filled 2019-02-04: qty 2

## 2019-02-04 MED ORDER — TERBUTALINE SULFATE 1 MG/ML IJ SOLN
0.2500 mg | Freq: Once | INTRAMUSCULAR | Status: AC | PRN
  Administered 2019-02-05: 09:00:00 0.25 mg via SUBCUTANEOUS

## 2019-02-04 MED ORDER — LIDOCAINE HCL (PF) 1 % IJ SOLN: 30.0000 mL | INTRAMUSCULAR | Status: DC | PRN

## 2019-02-04 NOTE — H&P (Signed)
LABOR AND DELIVERY ADMISSION HISTORY AND PHYSICAL NOTE  Carmen [name] is a 28 y.o. female G2P0010 with IUP at [redacted]w[redacted]d by certain LMP presenting for PROM at 1200 with moderate meconium. Contractions mild and irregular.  She reports positive fetal movement. She denies leakage of fluid or vaginal bleeding.  Prenatal History/Complications: PNC at [facility] established at 13 weeks  Pregnancy complications:  - Sickle cell disease  -Syphilis diagnosed on 11/29/18 and treated x1 -alpha thalassemia silent carrier  -chronic anemia   Past Medical History: Past Medical History:  Diagnosis Date  . Headache(784.0)   . Sickle cell crisis ([place])   . Syphilis 2015   Was diagnosed and received one injection of antibiotics  . Thrombocytosis ([place]) 11/22/2014     CBC Latest Ref Rng & Units 12/13/2018 12/11/2018 12/10/2018 WBC 4.0 - 10.5 K/uL 14.8(H) 13.2(H) 15.9(H) Hemoglobin 12.0 - 15.0 g/dL 8.2(L) 7.7(L) 8.1(L) Hematocrit 36.0 - 46.0 % 23.7(L) 23.2(L) 24.5(L) Platelets 150 - 400 K/uL 326 399 388      Past Surgical History: Past Surgical History:  Procedure Laterality Date  . CHOLECYSTECTOMY N/A 11/30/2014   Procedure: LAPAROSCOPIC CHOLECYSTECTOMY SINGLE SITE WITH INTRAOPERATIVE CHOLANGIOGRAM;  Surgeon: Michael Boston, MD;  Location: [facility];  Service: General;  Laterality: N/A;  . SPLENECTOMY      Obstetrical History: OB History    Gravida  2   Para      Term      Preterm      AB  1   Living  0     SAB      TAB  1   Ectopic      Multiple      Live Births              Social History: Social History   Socioeconomic History  . Marital status: Single    Spouse name: Not on file  . Number of children: Not on file  . Years of education: Not on file  . Highest education level: Not on file  Occupational History  . Not on file  Social Needs  . Financial resource strain: Not on file  . Food insecurity:    Worry: Sometimes true    Inability: Sometimes true  . Transportation needs:     Medical: Yes    Non-medical: Yes  Tobacco Use  . Smoking status: Never Smoker  . Smokeless tobacco: Never Used  Substance and Sexual Activity  . Alcohol use: No  . Drug use: No  . Sexual activity: Yes    Birth control/protection: None  Lifestyle  . Physical activity:    Days per week: Not on file    Minutes per session: Not on file  . Stress: Not on file  Relationships  . Social connections:    Talks on phone: Not on file    Gets together: Not on file    Attends religious service: Not on file    Active member of club or organization: Not on file    Attends meetings of clubs or organizations: Not on file    Relationship status: Not on file  Other Topics Concern  . Not on file  Social History Narrative  . Not on file    Family History: Family History  Problem Relation Age of Onset  . Hypertension Mother   . Sickle cell anemia Sister   . Kidney disease Sister        Lupus  . Arthritis Sister   . Sickle cell anemia Sister   .  Sickle cell trait Sister   . Heart disease Maternal Aunt        CABG  . Heart disease Maternal Uncle        CABG  . Lupus Sister     Allergies: No Known Allergies  Medications Prior to Admission  Medication Sig Dispense Refill Last Dose  . aspirin EC 81 MG tablet Take 1 tablet (81 mg total) by mouth daily. 90 tablet 3 01/24/2019 at Unknown time  . ergocalciferol (VITAMIN D2) 50000 units capsule Take 1 capsule (50,000 Units total) by mouth once a week. 30 capsule 1 01/24/2019 at Unknown time  . folic acid (FOLVITE) 1 MG tablet Take 1 tablet (1 mg total) by mouth daily. 90 tablet 3 01/24/2019 at Unknown time  . morphine (MS CONTIN) 30 MG 12 hr tablet Take 1 tablet (30 mg total) by mouth every 12 (twelve) hours for 30 days. Every 12 hours. Not every 8. 60 tablet 0   . Nutritional Supplements (ENSURE HEALTHY MOM) LIQD Drink 1 pouch BID (Patient taking differently: Take 1 each by mouth 2 (two) times daily. ) 60 Bottle 6 Past Week at Unknown time  .  oxyCODONE (OXY IR/ROXICODONE) 5 MG immediate release tablet Take 1 tablet (5 mg total) by mouth every 6 (six) hours as needed for up to 15 days for severe pain. 60 tablet 0   . Prenatal Vit-Fe Fumarate-FA (PREPLUS) 27-1 MG TABS Take 1 tablet by mouth daily. 30 tablet 13 01/24/2019 at Unknown time     Review of Systems  All systems reviewed and negative except as stated in HPI  Physical Exam Blood pressure 123/80, pulse 73, temperature 98.5 F (36.9 C), temperature source Oral, resp. rate 18, height 5\' 3"  (1.6 m), weight 86.3 kg, last menstrual period 05/15/2018, SpO2 100 %. General appearance: alert, oriented, NAD Lungs: normal respiratory effort Heart: regular rate Abdomen: soft, non-tender; gravid, FH appropriate for GA Extremities: No calf swelling or tenderness Presentation: cephalic Fetal monitoring: 150 bpm, moderate variability, no acels, no decels  Uterine activity: q2-3 min  Dilation: 1 Effacement (%): 50 Station: -3 Exam by:: n druebbisch rn  Prenatal labs: ABO, Rh: --/--/O POS (04/03 4403) Antibody: NEG (04/03 4742) Rubella: 4.42 (10/17 1452) RPR: Reactive (01/29 0841)  HBsAg: Negative (10/17 1452)  HIV: Non Reactive (01/29 0841)  GC/Chlamydia: Negative  GBS:   Positive  2-hr GTT: Normal  Genetic screening:  Negative  Anatomy US: Normal   Prenatal Transfer Tool  Maternal Diabetes: No Genetic Screening: Normal Maternal Ultrasounds/Referrals: Normal Fetal Ultrasounds or other Referrals:  Referred to Materal Fetal Medicine  Maternal Substance Abuse:  No Significant Maternal Medications:  Meds include: Other: Narcotics for sickle cell  Significant Maternal Lab Results: Lab values include: Group B Strep positive  Results for orders placed or performed during the hospital encounter of 02/04/19 (from the past 24 hour(s))  POCT fern test   Collection Time: 02/04/19  2:11 PM  Result Value Ref Range   POCT Fern Test Positive = ruptured amniotic membanes      Patient Active Problem List   Diagnosis Date Noted  . Indication for care in labor or delivery 02/04/2019  . Pregnancy 01/25/2019  . Sickle cell crisis ([place]) 01/25/2019  . Sickle cell pain crisis ([place]) 12/04/2018  . Alpha thalassemia silent carrier 12/04/2018  . 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 Pain in extremity   . Supervision of high risk pregnancy, antepartum 08/17/2018  . Chronic prescription opiate use 03/13/2018  .  Chronic musculoskeletal pain 03/13/2018  . Vitamin D deficiency 01/13/2018  . Leukocytosis 08/21/2017  . 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 Hb-SS disease without crisis ([place]) 08/15/2016  . Anemia of chronic disease   . Systolic ejection murmur 51/08/2110  . Sickle cell anemia of mother during pregnancy [facility]) 05/16/2014    Assessment: KAYTLAN BEHRMAN is a 28 y.o. G2P0010 at [redacted]w[redacted]d here for PROM.   #Labor: FB now in place. Will start low dose Pitocin, hold at 6 mu/min until FB out.  #Pain: Epidural in place  #FWB: Cat I  #ID:  GBS+, PCN for prophylaxis  #MOF: Breast  #MOC:BTL  #Circ:  Outpatient   Melina Schools 02/04/2019, 3:42 PM

## 2019-02-04 NOTE — MAU Note (Signed)
Carmen [name] is a 28 y.o. at [redacted]w[redacted]d here in MAU reporting: LOF since noon. States it was dark. Also having contractions. No bleeding.  Onset of complaint: today  Pain score: 10/10  Vitals:   02/04/19 1354  BP: 123/80  Pulse: 73  Resp: 18  Temp: 98.5 F (36.9 C)  SpO2: 100%      Lab orders placed from triage: none

## 2019-02-04 NOTE — Progress Notes (Signed)
Multiple attempts to get secondary IV access for PCA for pain control-IV team notified as well as CRNA to attempt to gain additional IV access

## 2019-02-04 NOTE — Anesthesia Procedure Notes (Signed)
Epidural Patient location during procedure: OB Start time: 02/04/2019 4:25 PM End time: 02/04/2019 4:30 PM  Staffing Anesthesiologist: Janeece Riggers, MD  Preanesthetic Checklist Completed: patient identified, site marked, surgical consent, pre-op evaluation, timeout performed, IV checked, risks and benefits discussed and monitors and equipment checked  Epidural Patient position: sitting Prep: site prepped and draped and DuraPrep Patient monitoring: continuous pulse ox and blood pressure Approach: midline Location: L3-L4 Injection technique: LOR air  Needle:  Needle type: Tuohy  Needle gauge: 17 G Needle length: 9 cm and 9 Needle insertion depth: 6 cm Catheter type: closed end flexible Catheter size: 19 Gauge Catheter at skin depth: 11 cm Test dose: negative  Assessment Events: blood not aspirated, injection not painful, no injection resistance, negative IV test and no paresthesia

## 2019-02-04 NOTE — Anesthesia Preprocedure Evaluation (Signed)
Anesthesia Evaluation  Patient identified by MRN, date of birth, ID band Patient awake    Reviewed: Allergy & Precautions, H&P , NPO status , Patient's Chart, lab work & pertinent test results, reviewed documented beta blocker date and time   Airway Mallampati: I  TM Distance: >3 FB Neck ROM: full    Dental no notable dental hx. (+) Teeth Intact   Pulmonary neg pulmonary ROS,    Pulmonary exam normal breath sounds clear to auscultation       Cardiovascular negative cardio ROS Normal cardiovascular exam Rhythm:regular Rate:Normal     Neuro/Psych negative neurological ROS  negative psych ROS   GI/Hepatic negative GI ROS, Neg liver ROS,   Endo/Other  negative endocrine ROS  Renal/GU negative Renal ROS  negative genitourinary   Musculoskeletal   Abdominal   Peds  Hematology  (+) Blood dyscrasia, Sickle cell anemia and anemia ,   Anesthesia Other Findings   Reproductive/Obstetrics (+) Pregnancy                             Anesthesia Physical Anesthesia Plan  ASA: III  Anesthesia Plan: Epidural   Post-op Pain Management:    Induction:   PONV Risk Score and Plan:   Airway Management Planned:   Additional Equipment:   Intra-op Plan:   Post-operative Plan:   Informed Consent: I have reviewed the patients History and Physical, chart, labs and discussed the procedure including the risks, benefits and alternatives for the proposed anesthesia with the patient or authorized representative who has indicated his/her understanding and acceptance.     Dental Advisory Given  Plan Discussed with:   Anesthesia Plan Comments: (Labs checked- platelets confirmed with RN in room. Fetal heart tracing, per RN, reported to be stable enough for sitting procedure. Discussed epidural, and patient consents to the procedure:  included risk of possible headache,backache, failed block, allergic reaction,  and nerve injury. This patient was asked if she had any questions or concerns before the procedure started.)        Anesthesia Quick Evaluation

## 2019-02-04 NOTE — Progress Notes (Addendum)
LABOR PROGRESS NOTE  Carmen [name] is a 28 y.o. G2P0010 at [redacted]w[redacted]d  admitted for IOL for PROM- moderate meconium @1200   Subjective: Patient reports being in sickle cell crisis, Dilaudid PCA initiated per consultation. Patient comfortable with epidural and not feeling contractions.   Objective: BP 113/75   Pulse 65   Temp 98.7 F (37.1 C) (Oral)   Resp 16   Ht 5\' 3"  (1.6 m)   Wt 86.3 kg   LMP 05/15/2018 (Approximate)   SpO2 100%   BMI 33.69 kg/m  or  Vitals:   02/04/19 1731 02/04/19 1801 02/04/19 1831 02/04/19 2022  BP: 125/72 115/75 113/75   Pulse: 62 65 65   Resp: 16 16 16 16   Temp:   98.7 F (37.1 C)   TempSrc:   Oral   SpO2: 100%     Weight:      Height:       Moderate meconium at admission has now become thick mec  FB out @ 2046. IUPC and FSE inserted @2050 .  Dilation: 4 Effacement (%): 50 Cervical Position: Middle Station: -2 Presentation: Vertex Exam by:: Wende Bushy CNM FHT: baseline rate 150, minimal varibility, no accel, variable decel Toco: 2-5/ mild by palpation   Labs: Lab Results  Component Value Date   WBC 15.2 (H) 02/04/2019   HGB 8.5 (L) 02/04/2019   HCT 24.8 (L) 02/04/2019   MCV 94.3 02/04/2019   PLT 400 02/04/2019    Patient Active Problem List   Diagnosis Date Noted  . Indication for care in labor or delivery 02/04/2019  . Pregnancy 01/25/2019  . Sickle cell crisis ([place]) 01/25/2019  . Sickle cell pain crisis ([place]) 12/04/2018  . Alpha thalassemia silent carrier 12/04/2018  . 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 Pain in extremity   . Supervision of high risk pregnancy, antepartum 08/17/2018  . Chronic prescription opiate use 03/13/2018  . Chronic musculoskeletal pain 03/13/2018  . Vitamin D deficiency 01/13/2018  . Leukocytosis 08/21/2017  . 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 Hb-SS disease without crisis ([place]) 08/15/2016  . Anemia of chronic disease   . Systolic ejection murmur 10/24/4974  .  Sickle cell anemia of mother during pregnancy [facility]) 05/16/2014    Assessment / Plan: 28 y.o. G2P0010 at [redacted]w[redacted]d here for IOL for PROM   Labor: IUPC and FSE placed, restart pitocin at 86milli-unit/min. Amnioinfusion ordered and started for variable decelerations. Recheck cervix around midnight or as needed.  Fetal Wellbeing:  Cat II  Pain Control:  Epidural and PCA for sickle cell crisis  Anticipated MOD:  Hopeful SVD   Dr Roselie Awkward notified and aware of tracing   Lajean Manes, CNM, CNM 02/04/2019, 9:20 PM

## 2019-02-05 ENCOUNTER — Encounter (HOSPITAL_COMMUNITY): Payer: Self-pay | Admitting: Obstetrics

## 2019-02-05 ENCOUNTER — Encounter (HOSPITAL_COMMUNITY)
Admission: AD | Disposition: A | Discharge status: Home / Self Care | Payer: Self-pay | Source: Home / Self Care | Attending: Obstetrics & Gynecology

## 2019-02-05 ENCOUNTER — Encounter (HOSPITAL_COMMUNITY): Payer: Self-pay | Admitting: Anesthesiology

## 2019-02-05 ENCOUNTER — Inpatient Hospital Stay (HOSPITAL_COMMUNITY): Payer: Medicaid Other | Admitting: Anesthesiology

## 2019-02-05 DIAGNOSIS — D571 Sickle-cell disease without crisis: Secondary | ICD-10-CM

## 2019-02-05 DIAGNOSIS — O99013 Anemia complicating pregnancy, third trimester: Secondary | ICD-10-CM

## 2019-02-05 DIAGNOSIS — D638 Anemia in other chronic diseases classified elsewhere: Secondary | ICD-10-CM

## 2019-02-05 DIAGNOSIS — Z3A37 37 weeks gestation of pregnancy: Secondary | ICD-10-CM

## 2019-02-05 DIAGNOSIS — O99824 Streptococcus B carrier state complicating childbirth: Secondary | ICD-10-CM

## 2019-02-05 DIAGNOSIS — O4202 Full-term premature rupture of membranes, onset of labor within 24 hours of rupture: Secondary | ICD-10-CM

## 2019-02-05 LAB — CBC
HCT: 20.2 % — ABNORMAL LOW (ref 36.0–46.0)
Hemoglobin: 6.8 g/dL — CL (ref 12.0–15.0)
MCH: 31.1 pg (ref 26.0–34.0)
MCHC: 33.7 g/dL (ref 30.0–36.0)
MCV: 92.2 fL (ref 80.0–100.0)
Platelets: 322 10*3/uL (ref 150–400)
RBC: 2.19 MIL/uL — ABNORMAL LOW (ref 3.87–5.11)
RDW: 14.3 % (ref 11.5–15.5)
WBC: 25.6 10*3/uL — ABNORMAL HIGH (ref 4.0–10.5)
nRBC: 15.4 % — ABNORMAL HIGH (ref 0.0–0.2)

## 2019-02-05 LAB — RPR, QUANT+TP ABS (REFLEX)
Rapid Plasma Reagin, Quant: 1:1 {titer} — ABNORMAL HIGH
T Pallidum Abs: REACTIVE — AB

## 2019-02-05 LAB — CREATININE, SERUM
Creatinine, Ser: 0.79 mg/dL (ref 0.44–1.00)
GFR calc Af Amer: >60 mL/min (ref >60)
GFR calc non Af Amer: >60 mL/min (ref >60)

## 2019-02-05 LAB — RPR: RPR Ser Ql: REACTIVE — AB

## 2019-02-05 SURGERY — Surgical Case
Anesthesia: Epidural | Site: Abdomen | Wound class: Clean Contaminated

## 2019-02-05 MED ORDER — NALBUPHINE HCL 10 MG/ML IJ SOLN: 5.0000 mg | INTRAMUSCULAR | Status: DC | PRN

## 2019-02-05 MED ORDER — FENTANYL CITRATE (PF) 100 MCG/2ML IJ SOLN: 25.0000 ug | INTRAMUSCULAR | Status: DC | PRN

## 2019-02-05 MED ORDER — SODIUM BICARBONATE 8.4 % IV SOLN
INTRAVENOUS | Status: DC | PRN
  Administered 2019-02-05 (×2): 5 mL via EPIDURAL

## 2019-02-05 MED ORDER — SODIUM CHLORIDE 0.9 % IV SOLN
500.0000 mg | Freq: Once | INTRAVENOUS | Status: AC
  Administered 2019-02-05: 13:00:00 500 mg via INTRAVENOUS

## 2019-02-05 MED ORDER — OXYCODONE-ACETAMINOPHEN 5-325 MG PO TABS
2.0000 | ORAL_TABLET | ORAL | Status: DC | PRN
  Administered 2019-02-06 – 2019-02-09 (×2): 2 via ORAL
  Filled 2019-02-05 (×2): qty 2

## 2019-02-05 MED ORDER — OXYCODONE-ACETAMINOPHEN 5-325 MG PO TABS: 1.0000 | ORAL_TABLET | ORAL | Status: DC | PRN

## 2019-02-05 MED ORDER — DIPHENHYDRAMINE HCL 50 MG/ML IJ SOLN: 12.5000 mg | INTRAMUSCULAR | Status: DC | PRN

## 2019-02-05 MED ORDER — COCONUT OIL OIL: 1.0000 "application " | TOPICAL_OIL | Status: DC | PRN

## 2019-02-05 MED ORDER — IBUPROFEN 800 MG PO TABS
800.0000 mg | ORAL_TABLET | Freq: Three times a day (TID) | ORAL | Status: DC
  Administered 2019-02-06 – 2019-02-10 (×11): 800 mg via ORAL
  Filled 2019-02-05 (×12): qty 1

## 2019-02-05 MED ORDER — PHENYLEPHRINE 40 MCG/ML (10ML) SYRINGE FOR IV PUSH (FOR BLOOD PRESSURE SUPPORT)
PREFILLED_SYRINGE | INTRAVENOUS | Status: DC | PRN
  Administered 2019-02-05 (×2): 80 ug via INTRAVENOUS
  Administered 2019-02-05: 120 ug via INTRAVENOUS

## 2019-02-05 MED ORDER — SODIUM BICARBONATE 8.4 % IV SOLN
INTRAVENOUS | Status: AC
  Filled 2019-02-05: qty 50

## 2019-02-05 MED ORDER — ZOLPIDEM TARTRATE 5 MG PO TABS: 5.0000 mg | ORAL_TABLET | Freq: Every evening | ORAL | Status: DC | PRN

## 2019-02-05 MED ORDER — TETANUS-DIPHTH-ACELL PERTUSSIS 5-2.5-18.5 LF-MCG/0.5 IM SUSP: 0.5000 mL | Freq: Once | INTRAMUSCULAR | Status: DC

## 2019-02-05 MED ORDER — MEPERIDINE HCL 25 MG/ML IJ SOLN: 6.2500 mg | INTRAMUSCULAR | Status: DC | PRN

## 2019-02-05 MED ORDER — NALOXONE HCL 4 MG/10ML IJ SOLN
1.0000 ug/kg/h | INTRAVENOUS | Status: DC | PRN
  Filled 2019-02-05: qty 5

## 2019-02-05 MED ORDER — LACTATED RINGERS IV SOLN
INTRAVENOUS | Status: DC
  Administered 2019-02-06 – 2019-02-09 (×3): via INTRAVENOUS

## 2019-02-05 MED ORDER — LIDOCAINE-EPINEPHRINE (PF) 2 %-1:200000 IJ SOLN
INTRAMUSCULAR | Status: AC
  Filled 2019-02-05: qty 10

## 2019-02-05 MED ORDER — WITCH HAZEL-GLYCERIN EX PADS: 1.0000 "application " | MEDICATED_PAD | CUTANEOUS | Status: DC | PRN

## 2019-02-05 MED ORDER — OXYTOCIN 40 UNITS IN NORMAL SALINE INFUSION - SIMPLE MED
INTRAVENOUS | Status: AC
  Filled 2019-02-05: qty 1000

## 2019-02-05 MED ORDER — DIPHENHYDRAMINE HCL 25 MG PO CAPS: 25.0000 mg | ORAL_CAPSULE | ORAL | Status: DC | PRN

## 2019-02-05 MED ORDER — DIPHENHYDRAMINE HCL 25 MG PO CAPS: 25.0000 mg | ORAL_CAPSULE | Freq: Four times a day (QID) | ORAL | Status: DC | PRN

## 2019-02-05 MED ORDER — DIBUCAINE 1 % RE OINT: 1.0000 "application " | TOPICAL_OINTMENT | RECTAL | Status: DC | PRN

## 2019-02-05 MED ORDER — KETOROLAC TROMETHAMINE 30 MG/ML IJ SOLN
INTRAMUSCULAR | Status: AC
  Filled 2019-02-05: qty 1

## 2019-02-05 MED ORDER — BUPIVACAINE HCL (PF) 0.5 % IJ SOLN
INTRAMUSCULAR | Status: AC
  Filled 2019-02-05: qty 30

## 2019-02-05 MED ORDER — SODIUM BICARBONATE 8.4 % IV SOLN
INTRAVENOUS | Status: DC | PRN
  Administered 2019-02-05 (×3): 5 mL via EPIDURAL

## 2019-02-05 MED ORDER — SCOPOLAMINE 1 MG/3DAYS TD PT72
MEDICATED_PATCH | TRANSDERMAL | Status: AC
  Filled 2019-02-05: qty 1

## 2019-02-05 MED ORDER — MORPHINE SULFATE (PF) 0.5 MG/ML IJ SOLN
INTRAMUSCULAR | Status: AC
  Filled 2019-02-05: qty 10

## 2019-02-05 MED ORDER — NALBUPHINE HCL 10 MG/ML IJ SOLN: 5.0000 mg | Freq: Once | INTRAMUSCULAR | Status: DC | PRN

## 2019-02-05 MED ORDER — OXYTOCIN 10 UNIT/ML IJ SOLN
INTRAMUSCULAR | Status: AC
  Filled 2019-02-05: qty 4

## 2019-02-05 MED ORDER — ONDANSETRON HCL 4 MG/2ML IJ SOLN
INTRAMUSCULAR | Status: AC
  Filled 2019-02-05: qty 2

## 2019-02-05 MED ORDER — SODIUM CHLORIDE 0.9 % IR SOLN
Status: DC | PRN
  Administered 2019-02-05: 1

## 2019-02-05 MED ORDER — SODIUM CHLORIDE 0.9 % IV SOLN
INTRAVENOUS | Status: AC
  Filled 2019-02-05: qty 500

## 2019-02-05 MED ORDER — PRENATAL MULTIVITAMIN CH
1.0000 | ORAL_TABLET | Freq: Every day | ORAL | Status: DC
  Administered 2019-02-06 – 2019-02-10 (×5): 1 via ORAL
  Filled 2019-02-05 (×6): qty 1

## 2019-02-05 MED ORDER — ONDANSETRON HCL 4 MG/2ML IJ SOLN: 4.0000 mg | Freq: Three times a day (TID) | INTRAMUSCULAR | Status: DC | PRN

## 2019-02-05 MED ORDER — SIMETHICONE 80 MG PO CHEW
80.0000 mg | CHEWABLE_TABLET | ORAL | Status: DC
  Administered 2019-02-05 – 2019-02-09 (×5): 80 mg via ORAL
  Filled 2019-02-05 (×5): qty 1

## 2019-02-05 MED ORDER — STERILE WATER FOR IRRIGATION IR SOLN
Status: DC | PRN
  Administered 2019-02-05: 1

## 2019-02-05 MED ORDER — PROMETHAZINE HCL 25 MG/ML IJ SOLN: 6.2500 mg | INTRAMUSCULAR | Status: DC | PRN

## 2019-02-05 MED ORDER — PHENYLEPHRINE 40 MCG/ML (10ML) SYRINGE FOR IV PUSH (FOR BLOOD PRESSURE SUPPORT)
PREFILLED_SYRINGE | INTRAVENOUS | Status: AC
  Filled 2019-02-05: qty 10

## 2019-02-05 MED ORDER — SIMETHICONE 80 MG PO CHEW: 80.0000 mg | CHEWABLE_TABLET | ORAL | Status: DC | PRN

## 2019-02-05 MED ORDER — MORPHINE SULFATE (PF) 0.5 MG/ML IJ SOLN
INTRAMUSCULAR | Status: DC | PRN
  Administered 2019-02-05: 3 mg via EPIDURAL
  Administered 2019-02-05: 2 mg via INTRAVENOUS

## 2019-02-05 MED ORDER — PHENYLEPHRINE HCL 10 MG/ML IJ SOLN: INTRAMUSCULAR | Status: DC | PRN

## 2019-02-05 MED ORDER — MENTHOL 3 MG MT LOZG: 1.0000 | LOZENGE | OROMUCOSAL | Status: DC | PRN

## 2019-02-05 MED ORDER — CEFAZOLIN SODIUM-DEXTROSE 2-4 GM/100ML-% IV SOLN
2.0000 g | Freq: Once | INTRAVENOUS | Status: AC
  Administered 2019-02-05: 2 g via INTRAVENOUS

## 2019-02-05 MED ORDER — ENOXAPARIN SODIUM 40 MG/0.4ML ~~LOC~~ SOLN
40.0000 mg | SUBCUTANEOUS | Status: DC
  Administered 2019-02-06 – 2019-02-10 (×5): 40 mg via SUBCUTANEOUS
  Filled 2019-02-05 (×5): qty 0.4

## 2019-02-05 MED ORDER — SODIUM CHLORIDE 0.9 % IV SOLN
INTRAVENOUS | Status: DC | PRN
  Administered 2019-02-05: 14:00:00 via INTRAVENOUS

## 2019-02-05 MED ORDER — SIMETHICONE 80 MG PO CHEW
80.0000 mg | CHEWABLE_TABLET | Freq: Three times a day (TID) | ORAL | Status: DC
  Administered 2019-02-05 – 2019-02-10 (×15): 80 mg via ORAL
  Filled 2019-02-05 (×15): qty 1

## 2019-02-05 MED ORDER — KETOROLAC TROMETHAMINE 30 MG/ML IJ SOLN
30.0000 mg | Freq: Four times a day (QID) | INTRAMUSCULAR | Status: AC
  Administered 2019-02-05 – 2019-02-06 (×3): 30 mg via INTRAVENOUS
  Filled 2019-02-05 (×3): qty 1

## 2019-02-05 MED ORDER — KETOROLAC TROMETHAMINE 30 MG/ML IJ SOLN
30.0000 mg | Freq: Once | INTRAMUSCULAR | Status: AC
  Administered 2019-02-05: 30 mg via INTRAVENOUS

## 2019-02-05 MED ORDER — SODIUM CHLORIDE 0.9% FLUSH: 3.0000 mL | INTRAVENOUS | Status: DC | PRN

## 2019-02-05 MED ORDER — TERBUTALINE SULFATE 1 MG/ML IJ SOLN
INTRAMUSCULAR | Status: AC
  Administered 2019-02-05: 09:00:00 0.25 mg via SUBCUTANEOUS
  Filled 2019-02-05: qty 1

## 2019-02-05 MED ORDER — SENNOSIDES-DOCUSATE SODIUM 8.6-50 MG PO TABS
2.0000 | ORAL_TABLET | ORAL | Status: DC
  Administered 2019-02-05 – 2019-02-09 (×5): 2 via ORAL
  Filled 2019-02-05 (×5): qty 2

## 2019-02-05 MED ORDER — SCOPOLAMINE 1 MG/3DAYS TD PT72
1.0000 | MEDICATED_PATCH | Freq: Once | TRANSDERMAL | Status: AC
  Administered 2019-02-05: 15:00:00 1.5 mg via TRANSDERMAL

## 2019-02-05 MED ORDER — OXYTOCIN 40 UNITS IN NORMAL SALINE INFUSION - SIMPLE MED: 2.5000 [IU]/h | INTRAVENOUS | Status: AC

## 2019-02-05 MED ORDER — NALOXONE HCL 0.4 MG/ML IJ SOLN: 0.4000 mg | INTRAMUSCULAR | Status: DC | PRN

## 2019-02-05 SURGICAL SUPPLY — 40 items
BENZOIN TINCTURE PRP APPL 2/3 (GAUZE/BANDAGES/DRESSINGS) ×3 IMPLANT
CHLORAPREP W/TINT 26ML (MISCELLANEOUS) ×3 IMPLANT
CLAMP CORD UMBIL (MISCELLANEOUS) IMPLANT
CLOSURE STERI STRIP 1/2 X4 (GAUZE/BANDAGES/DRESSINGS) ×3 IMPLANT
CLOTH BEACON ORANGE TIMEOUT ST (SAFETY) ×3 IMPLANT
DECANTER SPIKE VIAL GLASS SM (MISCELLANEOUS) ×3 IMPLANT
DRAPE C SECTION CLR SCREEN (DRAPES) IMPLANT
DRSG OPSITE POSTOP 4X10 (GAUZE/BANDAGES/DRESSINGS) ×3 IMPLANT
ELECT REM PT RETURN 9FT ADLT (ELECTROSURGICAL) ×3
ELECTRODE REM PT RTRN 9FT ADLT (ELECTROSURGICAL) ×1 IMPLANT
EXTRACTOR VACUUM M CUP 4 TUBE (SUCTIONS) IMPLANT
EXTRACTOR VACUUM M CUP 4' TUBE (SUCTIONS)
GLOVE BIO SURGEON STRL SZ7.5 (GLOVE) ×3 IMPLANT
GLOVE BIOGEL PI IND STRL 7.0 (GLOVE) ×1 IMPLANT
GLOVE BIOGEL PI INDICATOR 7.0 (GLOVE) ×2
GOWN STRL REUS W/TWL 2XL LVL3 (GOWN DISPOSABLE) ×3 IMPLANT
GOWN STRL REUS W/TWL LRG LVL3 (GOWN DISPOSABLE) ×6 IMPLANT
KIT ABG SYR 3ML LUER SLIP (SYRINGE) ×3 IMPLANT
NEEDLE HYPO 22GX1.5 SAFETY (NEEDLE) ×3 IMPLANT
NEEDLE HYPO 25X5/8 SAFETYGLIDE (NEEDLE) ×3 IMPLANT
NS IRRIG 1000ML POUR BTL (IV SOLUTION) ×3 IMPLANT
PACK C SECTION WH (CUSTOM PROCEDURE TRAY) ×3 IMPLANT
PAD OB MATERNITY 4.3X12.25 (PERSONAL CARE ITEMS) ×3 IMPLANT
PENCIL SMOKE EVAC W/HOLSTER (ELECTROSURGICAL) ×3 IMPLANT
RTRCTR C-SECT PINK 25CM LRG (MISCELLANEOUS) ×3 IMPLANT
STRIP CLOSURE SKIN 1/2X4 (GAUZE/BANDAGES/DRESSINGS) ×2 IMPLANT
SUT CHROMIC 1 CTX 36 (SUTURE) ×6 IMPLANT
SUT VIC AB 1 CT1 36 (SUTURE) ×6 IMPLANT
SUT VIC AB 2-0 CT1 (SUTURE) ×3 IMPLANT
SUT VIC AB 2-0 CT1 27 (SUTURE) ×2
SUT VIC AB 2-0 CT1 TAPERPNT 27 (SUTURE) ×1 IMPLANT
SUT VIC AB 3-0 CT1 27 (SUTURE) ×4
SUT VIC AB 3-0 CT1 TAPERPNT 27 (SUTURE) ×2 IMPLANT
SUT VIC AB 3-0 SH 27 (SUTURE)
SUT VIC AB 3-0 SH 27X BRD (SUTURE) IMPLANT
SUT VIC AB 4-0 KS 27 (SUTURE) ×3 IMPLANT
SYR BULB IRRIGATION 50ML (SYRINGE) IMPLANT
TOWEL OR 17X24 6PK STRL BLUE (TOWEL DISPOSABLE) ×3 IMPLANT
TRAY FOLEY W/BAG SLVR 14FR LF (SET/KITS/TRAYS/PACK) ×3 IMPLANT
WATER STERILE IRR 1000ML POUR (IV SOLUTION) ×3 IMPLANT

## 2019-02-05 NOTE — Progress Notes (Signed)
Patient ID: Carmen [name], female   DOB: 1991-02-23, 28 y.o.   MRN: [id number] Pt has made no further cervical progress dispite pitocin augmentation over the last 6 + hours. Infant has not tolerated labor pattern at times as well. C section has been recommended to pt.  The risks of cesarean section discussed with the patient included but were not limited to: bleeding which may require transfusion or reoperation; infection which may require antibiotics; injury to bowel, bladder, ureters or other surrounding organs; injury to the fetus; need for additional procedures including hysterectomy in the event of a life-threatening hemorrhage; placental abnormalities wth subsequent pregnancies, incisional problems, thromboembolic phenomenon and other postoperative/anesthesia complications. The patient concurred with the proposed plan, giving informed written consent for the procedure.   Anesthesia and OR aware. Preoperative prophylactic antibiotics and SCDs ordered on call to the OR.  To OR when ready.  Mayvis Agudelo L. Rip Harbour, MD

## 2019-02-05 NOTE — Anesthesia Postprocedure Evaluation (Signed)
Anesthesia Post Note  Patient: Carmen [name]  Procedure(s) Performed: AN AD HOC LABOR EPIDURAL     Patient location during evaluation: Mother Baby Anesthesia Type: Epidural Level of consciousness: awake and alert Pain management: pain level controlled Vital Signs Assessment: post-procedure vital signs reviewed and stable Respiratory status: spontaneous breathing, nonlabored ventilation and respiratory function stable Cardiovascular status: stable Postop Assessment: no headache, no backache and epidural receding Anesthetic complications: no    Last Vitals:  Vitals:   02/05/19 0850 02/05/19 0900  BP:  (!) 96/57  Pulse:  93  Resp:  18  Temp:    SpO2: 100% 99%    Last Pain:  Vitals:   02/05/19 0850  TempSrc:   PainSc: Asleep                 Tyiana Hill

## 2019-02-05 NOTE — Addendum Note (Signed)
Addendum  created 02/05/19 1009 by Janeece Riggers, MD   Clinical Note Signed

## 2019-02-05 NOTE — Anesthesia Postprocedure Evaluation (Signed)
Anesthesia Post Note  Patient: Carmen [name]  Procedure(s) Performed: CESAREAN SECTION (N/A Abdomen)     Patient location during evaluation: PACU Anesthesia Type: Epidural Level of consciousness: awake and alert Pain management: pain level controlled Vital Signs Assessment: post-procedure vital signs reviewed and stable Respiratory status: spontaneous breathing and respiratory function stable Cardiovascular status: blood pressure returned to baseline and stable Postop Assessment: spinal receding Anesthetic complications: no    Last Vitals:  Vitals:   02/05/19 1513 02/05/19 1515  BP:  121/76  Pulse:  90  Resp:    Temp:    SpO2: 100% 99%    Last Pain:  Vitals:   02/05/19 1513  TempSrc:   PainSc: 9    Pain Goal:      LLE Sensation: Tingling (02/05/19 1513)   RLE Sensation: Tingling (02/05/19 1513)     Epidural/Spinal Function Cutaneous sensation: Tingles (02/05/19 1500), Patient able to flex knees: Yes (02/05/19 1500), Patient able to lift hips off bed: Yes (02/05/19 1500), Back pain beyond tenderness at insertion site: No (02/05/19 1500), Progressively worsening motor and/or sensory loss: No (02/05/19 1500), Bowel and/or bladder incontinence post epidural: No (02/05/19 1500)  [place]

## 2019-02-05 NOTE — Anesthesia Preprocedure Evaluation (Deleted)
Anesthesia Evaluation Anesthesia Physical Anesthesia Plan Anesthesia Quick Evaluation  

## 2019-02-05 NOTE — Progress Notes (Signed)
LABOR PROGRESS NOTE  Carmen [name] is a 28 y.o. G2P0010 at [redacted]w[redacted]d  admitted for IOL for PROM   Subjective: Patient comfortable with epidural and PCA, reports crisis pain is decreasing with PCA   Objective: BP 125/76   Pulse (!) 102   Temp 98.2 F (36.8 C) (Axillary)   Resp 16   Ht 5\' 3"  (1.6 m)   Wt 86.3 kg   LMP 05/15/2018 (Approximate)   SpO2 98%   BMI 33.69 kg/m  or  Vitals:   02/05/19 0130 02/05/19 0200 02/05/19 0201 02/05/19 0231  BP: (!) 126/59 123/69  125/76  Pulse: (!) 186 93  (!) 102  Resp:  16 16 16   Temp:      TempSrc:      SpO2: 100% 100%  98%  Weight:      Height:        Replaced IUPC that was pulled out during position change  Dilation: 5 Effacement (%): 80 Cervical Position: Middle Station: -2 Presentation: Vertex Exam by:: Darrol Poke, CNM FHT: baseline rate 145, minimal varibility, no accel, no decel Toco: 1-4  Labs: Lab Results  Component Value Date   WBC 15.2 (H) 02/04/2019   HGB 8.5 (L) 02/04/2019   HCT 24.8 (L) 02/04/2019   MCV 94.3 02/04/2019   PLT 400 02/04/2019    Patient Active Problem List   Diagnosis Date Noted  . Indication for care in labor or delivery 02/04/2019  . Pregnancy 01/25/2019  . Sickle cell crisis ([place]) 01/25/2019  . Sickle cell pain crisis ([place]) 12/04/2018  . Alpha thalassemia silent carrier 12/04/2018  . 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 Pain in extremity   . Supervision of high risk pregnancy, antepartum 08/17/2018  . Chronic prescription opiate use 03/13/2018  . Chronic musculoskeletal pain 03/13/2018  . Vitamin D deficiency 01/13/2018  . Leukocytosis 08/21/2017  . 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 Hb-SS disease without crisis ([place]) 08/15/2016  . Anemia of chronic disease   . Systolic ejection murmur 88/82/8003  . Sickle cell anemia of mother during pregnancy [facility]) 05/16/2014    Assessment / Plan: 28 y.o. G2P0010 at [redacted]w[redacted]d here for IOL for PROM   Labor:  Progressing well on pitocin, continue titration to active labor  Fetal Wellbeing:  Cat II  Pain Control:  Epidural and PCA  Anticipated MOD:  SVD   Lajean Manes, CNM 02/05/2019, 3:05 AM

## 2019-02-05 NOTE — Anesthesia Postprocedure Evaluation (Signed)
Anesthesia Post Note  Patient: Carmen [name]  Procedure(s) Performed: AN AD HOC LABOR EPIDURAL     Patient location during evaluation: Mother Baby Anesthesia Type: Epidural Level of consciousness: awake and alert Pain management: pain level controlled Vital Signs Assessment: post-procedure vital signs reviewed and stable Respiratory status: spontaneous breathing, nonlabored ventilation and respiratory function stable Cardiovascular status: stable Postop Assessment: no headache, no backache and epidural receding Anesthetic complications: no    Last Vitals:  Vitals:   02/05/19 0850 02/05/19 0900  BP:  (!) 96/57  Pulse:  93  Resp:  18  Temp:    SpO2: 100% 99%    Last Pain:  Vitals:   02/05/19 0850  TempSrc:   PainSc: Asleep                 Cully Luckow

## 2019-02-05 NOTE — Anesthesia Postprocedure Evaluation (Signed)
Anesthesia Post Note  Patient: Carmen [name]  Procedure(s) Performed: AN AD [place] EPIDURAL     Patient location during evaluation: Mother Baby Anesthesia Type: Epidural Level of consciousness: awake and oriented Pain management: pain level controlled Vital Signs Assessment: post-procedure vital signs reviewed and stable Respiratory status: spontaneous breathing and respiratory function stable Cardiovascular status: blood pressure returned to baseline and stable Postop Assessment: no headache Anesthetic complications: no    Last Vitals:  Vitals:   02/05/19 1515 02/05/19 1807  BP: 121/76 131/88  Pulse: 90   Resp:    Temp:    SpO2: 99%     Last Pain:  Vitals:   02/05/19 1648  TempSrc:   PainSc: 9    Pain Goal:                   Kailash Hinze

## 2019-02-05 NOTE — Progress Notes (Signed)
In to speak to patient about current POC. Patient on bedpan attempting BM with no success. Patient verbalizes frustration stating, "If the poop is holding my baby's head back, why can't we just go ahead and do a c-section? I'm OVER IT!!"   Reassurance given that we will do all we can to ensure a safe delivery of baby. Informed her that if we are able to disimpact her feces, it may make a big difference in descent of fetal head. We will re-evaluate to determine baby's tolerance of the current labor. Consider restarting Pitocin, if fetal head descends well.   Will update attending MD as needed. Patient and spouse Patient verbalized an understanding of the plan of care and agrees.   Laury Deep, CNM

## 2019-02-05 NOTE — Transfer of Care (Signed)
Immediate Anesthesia Transfer of Care Note  Patient: Carmen [name]  Procedure(s) Performed: CESAREAN SECTION (N/A Abdomen)  Patient Location: PACU  Anesthesia Type:Epidural  Level of Consciousness: sedated  Airway & Oxygen Therapy: Patient Spontanous Breathing  Post-op Assessment: Report given to RN  Post vital signs: Reviewed and stable  Last Vitals:  Vitals Value Taken Time  BP 87/60 02/05/2019  2:20 PM  Temp    Pulse 119 02/05/2019  2:22 PM  Resp 18 02/05/2019  2:22 PM  SpO2 99 % 02/05/2019  2:22 PM  Vitals shown include unvalidated device data.  Last Pain:  Vitals:   02/05/19 1156  TempSrc:   PainSc: 0-No pain         Complications: No apparent anesthesia complications

## 2019-02-05 NOTE — Progress Notes (Signed)
Subjective: Patient is a 28 year old female with history of sickle cell disease who had cesarean section today with delivery of a live baby boy.  She has been in sickle cell crisis throughout her labor.  Currently on Dilaudid PCA.  Patient also has hemoglobin dropped to 6.8.  She is having pain at 8 out of 10 in her lower back and legs.  She has chronic pain syndrome from her sickle cell disease.  Denied any chest pain.  No nausea vomiting or diarrhea.  Objective: Vital signs in last 24 hours: Temp:  [98 F (36.7 C)-99.8 F (37.7 C)] 99.8 F (37.7 C) (04/06 1057) Pulse Rate:  [56-186] 95 (04/06 1200) Resp:  [16-22] 22 (04/06 1156) BP: (96-159)/(56-106) 125/56 (04/06 1200) SpO2:  [95 %-100 %] 97 % (04/06 1200) Weight:  [86.3 kg] 86.3 kg (04/05 1514) Weight change:     Intake/Output from previous day: No intake/output data recorded. Intake/Output this shift: Total I/O In: -  Out: 500 [Urine:500]  General appearance: alert and no distress Back: symmetric, no curvature. ROM normal. No CVA tenderness. Resp: clear to auscultation bilaterally Cardio: regular rate and rhythm, S1, S2 normal, no murmur, click, rub or gallop GI: soft, non-tender; bowel sounds normal; no masses,  no organomegaly Extremities: extremities normal, atraumatic, no cyanosis or edema Pulses: 2+ and symmetric Skin: Skin color, texture, turgor normal. No rashes or lesions Neurologic: Grossly normal  Lab Results: Recent Labs    02/02/19 1309 02/04/19 1522  WBC 12.7* 15.2*  HGB 7.9* 8.5*  HCT 22.8* 24.8*  PLT 375 400   BMET Recent Labs    02/02/19 1309  NA 134*  K 3.5  CL 105  CO2 23  GLUCOSE 111*  BUN 8  CREATININE 0.64  CALCIUM 8.6*    Studies/Results: No results found.  Medications: I have reviewed the patient's current medications.  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  #1 sickle cell painful crisis: We will continue with Dilaudid PCA and  Toradol regimen will be introduced when safe.  Now the patient is delivered of her baby.  Not planning to breast-feed we will continue full speed treatment.  Monitor H&H.  #2 anemia of chronic disease: Hemoglobin 6.8.  Recheck in the morning.  If hemoglobin drops further we will transfuse patient.  #3 hyponatremia: Mild.  Postpartum.  Mild fluid resuscitation.  #4 chronic pain syndrome: We will continue oxycodone acetaminophen.  Long-acting may be reintroduced since patient is no longer pregnant.  LOS: 1 day   Lari Linson,LAWAL 02/05/2019, 12:24 PM

## 2019-02-05 NOTE — Progress Notes (Signed)
CRITICAL VALUE ALERT  Critical Value:  hgb 6.8  Date & Time Notied:  02/05/2019 1649  Provider Notified: Dr. Harolyn Rutherford   Orders Received/Actions taken: RN to contact sickle cell provider

## 2019-02-05 NOTE — Progress Notes (Signed)
Subjective: Carmen [name] is a 29 y.o. G2P0010 at [redacted]w[redacted]d by LMP admitted for PROM at 1200 with moderate meconium stained fluid and irregular UC's.  Objective: BP (!) 125/56   Pulse 95   Temp 99.8 F (37.7 C) (Axillary)   Resp (!) 22   Ht 5\' 3"  (1.6 m)   Wt 86.3 kg   LMP 05/15/2018 (Approximate)   SpO2 97%   BMI 33.69 kg/m  No intake/output data recorded. Total I/O In: -  Out: 500 [Urine:500]  FHT:  FHR: 155 bpm, variability: moderate,  accelerations:  Abscent,  decelerations:  Present early variables with every UC's. Down to 60 bpm x 60-90 secs UC:   regular, every 3-5 minutes MVUs: 90 Amnioinfusion infusing Pitocin: 4 milli-units/min SVE:   Dilation: 5.5 Effacement (%): 80 Station: -1, caput @ 0 Exam by: Sunday Corn, CNM  Labs: Lab Results  Component Value Date   WBC 15.2 (H) 02/04/2019   HGB 8.5 (L) 02/04/2019   HCT 24.8 (L) 02/04/2019   MCV 94.3 02/04/2019   PLT 400 02/04/2019    Assessment / Plan: Augmentation of labor / D/C pitocin for FHR Sickle Cell Dz (Currently in Crisis)  Labor: Protracted Labor Preeclampsia:  n/a Fetal Wellbeing:  Category II Pain Control:  Epidural, Dilaudid PCA for Sickle Cell Crisis I/D:  n/a Anticipated MOD:  possible C/S   *Consult with Dr. Rip Harbour @ 1226 - notified of patient's complaints, assessments, & NST -- with current FHR tracing, current medical condition and no cervical progression, request Dr. Rip Harbour to come speak to pt about C/S - agrees with plan; will come evaluate/speak to patient and FOB  Laury Deep, MSN, CNM 02/05/2019, 12:25 PM

## 2019-02-05 NOTE — Op Note (Signed)
Cesarean Section Procedure Note  02/05/2019  2:39 PM  PATIENT:  Carmen [name]  28 y.o. female  PRE-OPERATIVE DIAGNOSIS:  arrest of dilatiion, nonreassuring fetal heart tracing  POST-OPERATIVE DIAGNOSIS:  arrest of dilatiion, nonreassuring fetal heart tracing  PROCEDURE:  Procedure(s): CESAREAN SECTION (N/A)  SURGEON:  Surgeon(s) and Role:    * [name], MD - Primary  ASSISTANTS: none   ANESTHESIA:   local and epidural  EBL:  Total I/O In: 1300 [I.V.:1300] Out: 1387 [Urine:650; Blood:737]  BLOOD ADMINISTERED:none  DRAINS: none   LOCAL MEDICATIONS USED:  BUPIVICAINE   SPECIMEN:  Source of Specimen:  uterus  DISPOSITION OF SPECIMEN:  PATHOLOGY   Procedure Details   The patient was seen in the Holding Room. The risks, benefits, complications, treatment options, and expected outcomes were discussed with the patient.  The patient concurred with the proposed plan, giving informed consent.  The site of surgery properly noted/marked. The patient was taken to Operating Room # C, identified as MIMA CRANMORE and the procedure verified as C-Section Delivery. A Time Out was held and the above information confirmed.  After induction of anesthesia, the patient was draped and prepped in the usual sterile manner. A Pfannenstiel incision was made and carried down through the subcutaneous tissue to the fascia. Fascial incision was made and extended transversely. The fascia was separated from the underlying rectus tissue superiorly and inferiorly. The peritoneum was identified and entered. Peritoneal incision was extended longitudinally. The utero-vesical peritoneal reflection was incised transversely and the bladder flap was bluntly freed from the lower uterine segment. A low transverse uterine incision was made. Delivered from cephalic presentation. Vertex was noted to be low in the pelvis requiring operator's hand to be inserted to break suction. Infants head was elevated to the  uterine incision and Luikart forceps were applied and infant was delivered without problems. Weight and Apgars pending. See delivery note for this information. Cord was clamped and infant was passed to NICU attendants.  After the umbilical cord was clamped and cut cord blood was obtained for evaluation. The placenta was removed intact and appeared normal. The uterine outline, tubes and ovaries appeared normal. The uterine incision was closed with running locked sutures of Chromic in a 2 layer fashion. Hemostasis was observed. Lavage was carried out until clear. The fascia was then reapproximated with running sutures of Vicryl. The skin was reapproximated with Vicryl. 30 cc of local was injected after skin closure.   Instrument, sponge, and needle counts were correct prior the abdominal closure and at the conclusion of the case.   Infant to NICU for observation.  Complications:  None; patient tolerated the procedure well.  COUNTS:  YES  PLAN OF CARE: Admit to inpatient   PATIENT DISPOSITION:  PACU - hemodynamically stable.   Delay start of Pharmacological VTE agent (>24hrs) due to surgical blood loss or risk of bleeding: no             Disposition: PACU - hemodynamically stable.         Condition: stable   [name], MD 02/05/2019 2:39 PM

## 2019-02-05 NOTE — Anesthesia Preprocedure Evaluation (Signed)
Anesthesia Evaluation  Patient identified by MRN, date of birth, ID band Patient awake    Reviewed: Allergy & Precautions, H&P , NPO status , Patient's Chart, lab work & pertinent test results, reviewed documented beta blocker date and time   History of Anesthesia Complications Negative for: history of anesthetic complications  Airway Mallampati: I  TM Distance: >3 FB Neck ROM: full    Dental no notable dental hx. (+) Teeth Intact, Dental Advisory Given   Pulmonary neg pulmonary ROS,    Pulmonary exam normal breath sounds clear to auscultation       Cardiovascular negative cardio ROS Normal cardiovascular exam Rhythm:regular Rate:Normal     Neuro/Psych negative neurological ROS  negative psych ROS   GI/Hepatic negative GI ROS, Neg liver ROS,   Endo/Other  negative endocrine ROS  Renal/GU negative Renal ROS  negative genitourinary   Musculoskeletal   Abdominal   Peds  Hematology  (+) Blood dyscrasia, Sickle cell anemia and anemia ,   Anesthesia Other Findings   Reproductive/Obstetrics (+) Pregnancy                             Anesthesia Physical  Anesthesia Plan  ASA: III  Anesthesia Plan: Epidural   Post-op Pain Management:    Induction:   PONV Risk Score and Plan:   Airway Management Planned:   Additional Equipment:   Intra-op Plan:   Post-operative Plan:   Informed Consent: I have reviewed the patients History and Physical, chart, labs and discussed the procedure including the risks, benefits and alternatives for the proposed anesthesia with the patient or authorized representative who has indicated his/her understanding and acceptance.     Dental Advisory Given  Plan Discussed with: CRNA, Anesthesiologist and Surgeon  Anesthesia Plan Comments:         Anesthesia Quick Evaluation

## 2019-02-06 LAB — CBC
HCT: 18.4 % — ABNORMAL LOW (ref 36.0–46.0)
HCT: 22.2 % — ABNORMAL LOW (ref 36.0–46.0)
Hemoglobin: 6.2 g/dL — CL (ref 12.0–15.0)
Hemoglobin: 7.5 g/dL — ABNORMAL LOW (ref 12.0–15.0)
MCH: 31.3 pg (ref 26.0–34.0)
MCH: 30.4 pg (ref 26.0–34.0)
MCHC: 33.7 g/dL (ref 30.0–36.0)
MCHC: 33.8 g/dL (ref 30.0–36.0)
MCV: 92.9 fL (ref 80.0–100.0)
MCV: 89.9 fL (ref 80.0–100.0)
Platelets: 359 10*3/uL (ref 150–400)
Platelets: 433 10*3/uL — ABNORMAL HIGH (ref 150–400)
RBC: 1.98 MIL/uL — ABNORMAL LOW (ref 3.87–5.11)
RBC: 2.47 MIL/uL — ABNORMAL LOW (ref 3.87–5.11)
RDW: 14.1 % (ref 11.5–15.5)
RDW: 15.8 % — ABNORMAL HIGH (ref 11.5–15.5)
WBC: 27.2 10*3/uL — ABNORMAL HIGH (ref 4.0–10.5)
WBC: 27.1 10*3/uL — ABNORMAL HIGH (ref 4.0–10.5)
nRBC: 14.6 % — ABNORMAL HIGH (ref 0.0–0.2)
nRBC: 15.6 % — ABNORMAL HIGH (ref 0.0–0.2)

## 2019-02-06 LAB — PREPARE RBC (CROSSMATCH)

## 2019-02-06 MED ORDER — SODIUM CHLORIDE 0.9% IV SOLUTION: Freq: Once | INTRAVENOUS | Status: DC

## 2019-02-06 MED ORDER — MORPHINE SULFATE ER 30 MG PO TBCR
30.0000 mg | EXTENDED_RELEASE_TABLET | Freq: Two times a day (BID) | ORAL | Status: DC
  Administered 2019-02-06 – 2019-02-10 (×8): 30 mg via ORAL
  Filled 2019-02-06 (×8): qty 1

## 2019-02-06 MED ORDER — FUROSEMIDE 10 MG/ML IJ SOLN: 20.0000 mg | Freq: Once | INTRAMUSCULAR | Status: DC

## 2019-02-06 MED ORDER — FUROSEMIDE 10 MG/ML IJ SOLN
20.0000 mg | Freq: Once | INTRAMUSCULAR | Status: AC
  Administered 2019-02-06: 11:00:00 20 mg via INTRAVENOUS
  Filled 2019-02-06: qty 2

## 2019-02-06 MED ORDER — OXYCODONE HCL 5 MG PO TABS: 5.0000 mg | ORAL_TABLET | Freq: Four times a day (QID) | ORAL | Status: DC | PRN

## 2019-02-06 NOTE — Progress Notes (Signed)
Postpartum Day 1: Cesarean Delivery  Subjective: Patient reports incisional pain, tolerating PO and no problems voiding.  No flatus yet. Still reports pain in her legs and back. No chest pain.  Followed by Sickle Cell Service, appreciate their input.  Objective: Vital signs in last 24 hours: Temp:  [98 F (36.7 C)-99.8 F (37.7 C)] 98.2 F (36.8 C) (04/07 1003) Pulse Rate:  [58-123] 83 (04/07 1016) Resp:  [16-22] 17 (04/07 1003) BP: (104-135)/(52-88) 104/67 (04/07 1016) SpO2:  [95 %-100 %] 97 % (04/07 1016) FiO2 (%):  [34 %] 34 % (04/06 1544)  Physical Exam:  General: alert and no distress Lochia: appropriate Uterine Fundus: firm Incision: healing well, no significant drainage, no dehiscence DVT Evaluation: 2+ pitting BLE edema, symmetric.  CBC Latest Ref Rng & Units 02/06/2019 02/05/2019 02/04/2019  WBC 4.0 - 10.5 K/uL 27.2(H) 25.6(H) 15.2(H)  Hemoglobin 12.0 - 15.0 g/dL 6.2(LL) 6.8(LL) 8.5(L)  Hematocrit 36.0 - 46.0 % 18.4(L) 20.2(L) 24.8(L)  Platelets 150 - 400 K/uL 359 322 400    Assessment/Plan: Status post Cesarean section. Postoperative course complicated by postoperative anemia in the setting of sickle cell anemia/crisis  Appreciate input from Dr. Jonelle Sidle (Sickle Cell Service); ordered for transfusion of one pRBC Will continue Toradol and Dilaudid PCA, and other analgesia as needed Lasix ordered pre and post transfusion to help with edema Routine postpartum care  Verita Schneiders, MD 02/06/2019, 10:20 AM

## 2019-02-06 NOTE — Clinical Social Work Maternal (Signed)
CLINICAL SOCIAL WORK MATERNAL/CHILD NOTE  Patient Details  Name: Carmen [name] MRN: [id number] Date of Birth: 1991-02-05  Date:  02/06/2019  Clinical Social Worker Initiating Note:  Abundio Miu, [place] Date/Time: Initiated:  02/06/19/1048     Child's Name:  Carmen [name]   Biological Parents:  Mother, Father(Father: Sharalyn Ink)   Need for Interpreter:  None   Reason for Referral:  Behavioral Health Concerns, Other (Comment)(NICU admission)   Address:  [place] [address]    Phone number:  [phone] (home)     Additional phone number:   Household Members/Support Persons (HM/SP):   Household Member/Support Person 1   HM/SP Name Relationship DOB or Age  HM/SP -1   mother     HM/SP -2        HM/SP -3        HM/SP -4        HM/SP -5        HM/SP -6        HM/SP -7        HM/SP -8          Natural Supports (not living in the home):  Immediate Family, Spouse/significant other, Other (Comment)(FOB's mother)   Medical illustrator Supports: None   Employment: Unemployed   Type of Work:     Education:  Programmer, systems   Homebound arranged:    Museum/gallery curator Resources:  [employer]   Other Resources:  [employer], Physicist, medical    Cultural/Religious Considerations Which May Impact Care:    Strengths:  Ability to meet basic needs , Home prepared for child , Pediatrician chosen   Psychotropic Medications:         Pediatrician:    [place]  Pediatrician List:   [facility] [place]      Pediatrician Fax Number:    Risk Factors/Current Problems:  None   Cognitive State:  Alert , Able to Concentrate , Linear Thinking , Goal Oriented    Mood/Affect:  Interested , Calm , Happy , Relaxed    CSW Assessment: CSW met with MOB at bedside to discuss infant's NICU admission and behavioral health concerns. MOB was welcoming,  pleasant and engaged during assessment. MOB reported that she resides with her mother and receives [facility] and food stamps. MOB reported that infant has a lot of items and just needs blankets and a diaper bag. CSW agreed to provide MOB with a baby bundle and ask [employer] about providing MOB with a diaper bag. MOB appreciative. CSW inquired about MOB's support system, MOB reported that her mother, sisters, FOB and FOB's mother were her supports.  CSW and MOB discussed infant's NICU admission. MOB reported that it has been going great and reported that she is awaiting a phone call with an update about infant. CSW provided MOB with NICU phone number to call and get updates as needed. FOB entered room and reported that he just went to see infant. CSW informed MOB/FOB about the NICU, what to expect and resources/supports available while infant is admitted to the NICU. MOB denied any concerns/needs at this time. CSW encouraged MOB to reach out to CSW if needed. CSW provided MOB with a butterfly from [employer] and [employer] information.   CSW asked FOB to leave the room to continue assessment with MOB, FOB  left voluntarily. CSW inquired about MOB's mental health history, MOB denied any mental health history. CSW asked MOB about anxiety and cluster B personality disorder in her Epic chart, MOB denied both diagnoses and reported that she has never been treated for either diagnosis. MOB presented calm and did not demonstrate any acute mental health signs/symptoms. CSW assessed for safety, MOB denied SI, HI and domestic violence.   CSW provided education regarding the baby blues period vs. perinatal mood disorders, discussed treatment and gave resources for mental health follow up if concerns arise.  CSW recommends self-evaluation during the postpartum time period using the New Mom Checklist from Postpartum Progress and encouraged MOB to contact a medical professional if symptoms are  noted at any time.    CSW provided review of Sudden Infant Death Syndrome (SIDS) precautions. MOB verbalized understanding and reported that infant will sleep in a crib in her room.   CSW will continue to offer support and resources while infant is admitted to the NICU.   CSW Plan/Description:  Perinatal Mood and Anxiety Disorder (PMADs) Education, Sudden Infant Death Syndrome (SIDS) Education, Other Patient/Family Education    Burnis Medin, LCSW 02/06/2019, 10:52 AM

## 2019-02-06 NOTE — Progress Notes (Signed)
Subjective: Patient is doing much better but pain is still at 8 out of 10.  Hemoglobin has dropped further.  Denied any fever or chills no nausea vomiting or diarrhea.  Pain is still in her lower back.  Still on Dilaudid PCA with her short acting oral medications.  She has been off her MS Contin since she became pregnant in the third trimester.  Objective: Vital signs in last 24 hours: Temp:  [98 F (36.7 C)-99.8 F (37.7 C)] 98 F (36.7 C) (04/07 0556) Pulse Rate:  [58-123] 88 (04/06 2323) Resp:  [16-22] 22 (04/07 0556) BP: (96-135)/(52-88) 105/65 (04/07 0556) SpO2:  [95 %-100 %] 98 % (04/06 2323) FiO2 (%):  [34 %] 34 % (04/06 1544) Weight change:     Intake/Output from previous day: 04/06 0701 - 04/07 0700 In: 1780 [P.O.:480; I.V.:1300] Out: 2637 [Urine:1900; Blood:737] Intake/Output this shift: No intake/output data recorded.  General appearance: alert and no distress Back: symmetric, no curvature. ROM normal. No CVA tenderness. Resp: clear to auscultation bilaterally Cardio: regular rate and rhythm, S1, S2 normal, no murmur, click, rub or gallop GI: soft, non-tender; bowel sounds normal; no masses,  no organomegaly Extremities: extremities normal, atraumatic, no cyanosis or edema Pulses: 2+ and symmetric Skin: Skin color, texture, turgor normal. No rashes or lesions Neurologic: Grossly normal  Lab Results: Recent Labs    02/05/19 1621 02/06/19 0541  WBC 25.6* 27.2*  HGB 6.8* 6.2*  HCT 20.2* 18.4*  PLT 322 359   BMET Recent Labs    02/05/19 1621  CREATININE 0.79    Studies/Results: No results found.  Medications: I have reviewed the patient's current medications.  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  #1 sickle cell painful crisis: We will continue with Dilaudid PCA and restart her long-acting morphine so patient can be titrated off the PCA easily.  She is not breast-feeding.  Gradually wean off the PCA.  #2  anemia of chronic disease: Hemoglobin has dropped to 6.2.  Will transfuse 1 unit of packed red blood cells to help the patient's recovery.  #3 hyponatremia: Mild.  Postpartum.  Mild fluid resuscitation.  #4 chronic pain syndrome: Resume long-acting morphine.   LOS: 2 days   Amelita Risinger,LAWAL 02/06/2019, 7:56 AM

## 2019-02-06 NOTE — Addendum Note (Signed)
Addendum  created 02/06/19 1842 by Duane Boston, MD   Intraprocedure Staff edited

## 2019-02-07 DIAGNOSIS — Z79891 Long term (current) use of opiate analgesic: Secondary | ICD-10-CM

## 2019-02-07 LAB — COMPREHENSIVE METABOLIC PANEL
ALT: 22 U/L (ref 0–44)
AST: 38 U/L (ref 15–41)
Albumin: 1.9 g/dL — ABNORMAL LOW (ref 3.5–5.0)
Alkaline Phosphatase: 256 U/L — ABNORMAL HIGH (ref 38–126)
Anion gap: 7 (ref 5–15)
BUN: 6 mg/dL (ref 6–20)
CO2: 23 mmol/L (ref 22–32)
Calcium: 8.3 mg/dL — ABNORMAL LOW (ref 8.9–10.3)
Chloride: 107 mmol/L (ref 98–111)
Creatinine, Ser: 0.75 mg/dL (ref 0.44–1.00)
GFR calc Af Amer: >60 mL/min (ref >60)
GFR calc non Af Amer: >60 mL/min (ref >60)
Glucose, Bld: 78 mg/dL (ref 70–99)
Potassium: 3.9 mmol/L (ref 3.5–5.1)
Sodium: 137 mmol/L (ref 135–145)
Total Bilirubin: 1.3 mg/dL — ABNORMAL HIGH (ref 0.3–1.2)
Total Protein: 5.6 g/dL — ABNORMAL LOW (ref 6.5–8.1)

## 2019-02-07 LAB — CBC WITH DIFFERENTIAL/PLATELET
Abs Immature Granulocytes: 0.3 10*3/uL — ABNORMAL HIGH (ref 0.00–0.07)
Basophils Absolute: 0.1 10*3/uL (ref 0.0–0.1)
Basophils Relative: 0 %
Eosinophils Absolute: 0.3 10*3/uL (ref 0.0–0.5)
Eosinophils Relative: 1 %
HCT: 21.9 % — ABNORMAL LOW (ref 36.0–46.0)
Hemoglobin: 7.6 g/dL — ABNORMAL LOW (ref 12.0–15.0)
Immature Granulocytes: 1 %
Lymphocytes Relative: 16 %
Lymphs Abs: 3.7 10*3/uL (ref 0.7–4.0)
MCH: 31.3 pg (ref 26.0–34.0)
MCHC: 34.7 g/dL (ref 30.0–36.0)
MCV: 90.1 fL (ref 80.0–100.0)
Monocytes Absolute: 2.2 10*3/uL — ABNORMAL HIGH (ref 0.1–1.0)
Monocytes Relative: 9 %
Neutro Abs: 17.5 10*3/uL — ABNORMAL HIGH (ref 1.7–7.7)
Neutrophils Relative %: 73 %
Platelets: 471 10*3/uL — ABNORMAL HIGH (ref 150–400)
RBC: 2.43 MIL/uL — ABNORMAL LOW (ref 3.87–5.11)
RDW: 15.1 % (ref 11.5–15.5)
WBC: 24 10*3/uL — ABNORMAL HIGH (ref 4.0–10.5)
nRBC: 14 % — ABNORMAL HIGH (ref 0.0–0.2)

## 2019-02-07 MED ORDER — FUROSEMIDE 20 MG PO TABS
20.0000 mg | ORAL_TABLET | Freq: Every day | ORAL | Status: DC
  Administered 2019-02-07 – 2019-02-10 (×4): 20 mg via ORAL
  Filled 2019-02-07 (×4): qty 1

## 2019-02-07 NOTE — Progress Notes (Signed)
Subjective: Patient is improving although pain is still at 6/10. She is on Dilaudid PCA and has used 30 mg in the last 24 hours. Has been initiated on MS Contin yesterday. Will continue to titrate off Diludid PCA. No new complaint.  Objective: Vital signs in last 24 hours: Temp:  [98 F (36.7 C)-98.6 F (37 C)] 98.6 F (37 C) (04/08 1550) Pulse Rate:  [70-85] 85 (04/08 1550) Resp:  [15-19] 19 (04/08 1550) BP: (101-124)/(56-93) 124/73 (04/08 1550) SpO2:  [92 %-100 %] 98 % (04/08 1550) Weight change:     Intake/Output from previous day: 04/07 0701 - 04/08 0700 In: 1455 [P.O.:1140; Blood:315] Out: 2300 [Urine:2300] Intake/Output this shift: Total I/O In: 360 [P.O.:360] Out: 2600 [Urine:2600]  General appearance: alert and no distress Back: symmetric, no curvature. ROM normal. No CVA tenderness. Resp: clear to auscultation bilaterally Cardio: regular rate and rhythm, S1, S2 normal, no murmur, click, rub or gallop GI: soft, non-tender; bowel sounds normal; no masses,  no organomegaly Extremities: extremities normal, atraumatic, no cyanosis or edema Pulses: 2+ and symmetric Skin: Skin color, texture, turgor normal. No rashes or lesions Neurologic: Grossly normal  Lab Results: Recent Labs    02/06/19 1808 02/07/19 0942  WBC 27.1* 24.0*  HGB 7.5* 7.6*  HCT 22.2* 21.9*  PLT 433* 471*   BMET Recent Labs    02/05/19 1621 02/07/19 0944  NA  --  137  K  --  3.9  CL  --  107  CO2  --  23  GLUCOSE  --  78  BUN  --  6  CREATININE 0.79 0.75  CALCIUM  --  8.3*    Studies/Results: No results found.  Medications: I have reviewed the patient's current medications.  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  #1 sickle cell painful crisis: Improving. We will continue with Dilaudid PCA and  her long-acting morphine. Gradually wean off the PCA. Continue to mobiliza and possibly home tomorrow.  #2 anemia of chronic disease: Hemoglobin  is stable at 7.2g after transfusion.  #3 hyponatremia: Mild.  Postpartum.  Mild fluid resuscitation.  #4 chronic pain syndrome: Resume long-acting morphine.   LOS: 3 days   GARBA,LAWAL 02/07/2019, 5:55 PM

## 2019-02-07 NOTE — Progress Notes (Signed)
CSW followed up with MOB and provided requested resources. MOB appreciative and thanked CSW. MOB provided CSW with a medical update about infant and reported that she may discharge tomorrow. CSW provided MOB with CSW's contact information and encouraged MOB to reach out to CSW as needed.  CSW will continue to offer support/resources to MOB while infant is admitted to the NICU.  Abundio Miu, [place] Worker [facility] Cell#: [phone]

## 2019-02-07 NOTE — Plan of Care (Signed)

## 2019-02-07 NOTE — Progress Notes (Signed)
Postpartum Day 2: Cesarean Delivery  Subjective: Patient reports mild incisional pain, tolerating PO and no problems voiding.  Has flatus. Still reports pain in her arms, legs and back. No chest pain.  Followed by Sickle Cell Service, appreciate their input.  Objective: Vital signs in last 24 hours: Temp:  [98 F (36.7 C)-98.8 F (37.1 C)] 98.1 F (36.7 C) (04/07 2335) Pulse Rate:  [70-81] 72 (04/08 0800) Resp:  [15-18] 15 (04/08 1055) BP: (101-118)/(56-74) 110/60 (04/08 0800) SpO2:  [92 %-98 %] 92 % (04/08 1055)  Physical Exam:  General: alert and no distress Lochia: appropriate Uterine Fundus: firm Incision: healing well, no significant drainage, no dehiscence DVT Evaluation: 1-2+ pitting BLE edema, symmetric.  CBC Latest Ref Rng & Units 02/07/2019 02/06/2019 02/06/2019  WBC 4.0 - 10.5 K/uL 24.0(H) 27.1(H) 27.2(H)  Hemoglobin 12.0 - 15.0 g/dL 7.6(L) 7.5(L) 6.2(LL)  Hematocrit 36.0 - 46.0 % 21.9(L) 22.2(L) 18.4(L)  Platelets 150 - 400 K/uL 471(H) 433(H) 359    Assessment/Plan: Status post Cesarean section. Postoperative course complicated by postoperative anemia in the setting of sickle cell anemia/crisis  Appreciate input from Dr. Jonelle Sidle (Sickle Cell Service);he has put in orders for her analgesia. Need to transition to oral medications. Stable from a postpartum standpoint Lasix ordered to help with edema Routine postpartum care  Verita Schneiders, MD 02/07/2019, 11:30 AM

## 2019-02-08 ENCOUNTER — Encounter (HOSPITAL_COMMUNITY): Payer: Self-pay | Admitting: Obstetrics and Gynecology

## 2019-02-08 DIAGNOSIS — Z98891 History of uterine scar from previous surgery: Secondary | ICD-10-CM

## 2019-02-08 LAB — TYPE AND SCREEN
ABO/RH(D): O POS
Antibody Screen: POSITIVE
DAT, IgG: NEGATIVE
Unit division: 0
Unit division: 0

## 2019-02-08 LAB — BPAM RBC
Blood Product Expiration Date: 202004122359
Blood Product Expiration Date: 202005012359
ISSUE DATE / TIME: 202004070953
Unit Type and Rh: 5100
Unit Type and Rh: 5100

## 2019-02-08 MED ORDER — MEDROXYPROGESTERONE ACETATE 150 MG/ML IM SUSP
150.0000 mg | Freq: Once | INTRAMUSCULAR | Status: AC
  Administered 2019-02-10: 150 mg via INTRAMUSCULAR
  Filled 2019-02-08 (×2): qty 1

## 2019-02-08 MED ORDER — OXYCODONE-ACETAMINOPHEN 5-325 MG PO TABS
1.0000 | ORAL_TABLET | Freq: Four times a day (QID) | ORAL | Status: DC
  Administered 2019-02-08 – 2019-02-10 (×9): 1 via ORAL
  Filled 2019-02-08 (×10): qty 1

## 2019-02-08 MED ORDER — IBUPROFEN 800 MG PO TABS: 800.0000 mg | ORAL_TABLET | Freq: Three times a day (TID) | ORAL | 1 refills | Status: DC | PRN

## 2019-02-08 MED ORDER — MORPHINE SULFATE ER 30 MG PO TBCR: 30.0000 mg | EXTENDED_RELEASE_TABLET | Freq: Two times a day (BID) | ORAL | 0 refills | Status: DC

## 2019-02-08 MED ORDER — FUROSEMIDE 10 MG/ML IJ SOLN
20.0000 mg | Freq: Once | INTRAMUSCULAR | Status: AC
  Administered 2019-02-08: 08:00:00 20 mg via INTRAVENOUS
  Filled 2019-02-08: qty 2

## 2019-02-08 MED ORDER — SENNOSIDES-DOCUSATE SODIUM 8.6-50 MG PO TABS: 2.0000 | ORAL_TABLET | Freq: Two times a day (BID) | ORAL | 2 refills | Status: DC | PRN

## 2019-02-08 NOTE — Discharge Summary (Signed)
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 yo G2P1011 at [redacted]w[redacted]d was admitted in Latent Labor after PROM on 02/04/2019. Patient had a labor course significant for prolonged PROM, being in sickle cell crisis requiring elevated dosages of narcotic medications, arrest of cervical dilation. Membrane Rupture Time/Date: 12:00 PM ,02/04/2019   The patient went for cesarean section due to Arrest of Dilation, and delivered a Viable infant,02/05/2019  Details of operation can be found in separate operative note. Patient had a prolonged and complicated postpartum course due to her pain and sickle cell crisis. Her pain and anemia was managed by Sickle Cell Service; she did receive one unit of pRBC for her anemia.  Her analgesia included high dose PCA, MS Contin, Percocet and Toradol; multiple changes were made to this regimen.  She was ambulating, tolerating a regular diet, passing flatus, and urinating well.  Her outpatient analgesia regimen was managed by Dr. Jonelle Sidle (Sickle Cell Service). Patient is discharged home in stable condition on 02/10/19.  Physical exam  Vitals:   02/10/19 0933 02/10/19 1030 02/10/19 1158 02/10/19 1250  BP: 112/65  112/65    Pulse: 96  100   Resp: (!) 21 18 17 18   Temp: 98.5 F (36.9 C)  98.3 F (36.8 C)   TempSrc: Oral  Oral   SpO2: 100% 100% 100% 99%  Weight:      Height:       General: alert and no distress Lochia: appropriate Uterine Fundus: firm Incision: Healing well with no significant drainage, No significant erythema, Dressing is clean, dry, and intact DVT Evaluation: No evidence of DVT seen on physical exam. Negative Homan's sign. Labs: CBC Latest Ref Rng & Units 02/07/2019 02/06/2019 02/06/2019  WBC 4.0 - 10.5 K/uL 24.0(H) 27.1(H) 27.2(H)  Hemoglobin 12.0 - 15.0 g/dL 7.6(L) 7.5(L) 6.2(LL)  Hematocrit 36.0 - 46.0 % 21.9(L) 22.2(L) 18.4(L)  Platelets 150 - 400 K/uL 471(H) 433(H) 359    CMP Latest Ref Rng & Units 02/10/2019  Glucose 70 - 99 mg/dL -  BUN 6 - 20 mg/dL 6  Creatinine 0.44 - 1.00 mg/dL 0.73  Sodium 135 - 145 mmol/L -  Potassium 3.5 - 5.1 mmol/L -  Chloride 98 - 111 mmol/L -  CO2 22 - 32 mmol/L -  Calcium 8.9 - 10.3 mg/dL -  Total Protein 6.5 - 8.1 g/dL -  Total Bilirubin 0.3 - 1.2 mg/dL -  Alkaline Phos 38 - 126 U/L -  AST 15 - 41 U/L -  ALT 0 - 44 U/L -    Discharge instruction: per After Visit Summary and "Baby and Me Booklet".  After visit meds:  Allergies as of 02/10/2019   No Known Allergies     Medication List    TAKE these medications   aspirin EC 81 MG tablet Take 1 tablet (81 mg total) by mouth daily.   Ensure Healthy Mom Liqd Drink 1 pouch BID   ergocalciferol 1.25 MG (50000 UT) capsule Commonly known as:  VITAMIN D2 Take 1 capsule (50,000 Units total) by mouth once a week.   folic acid 1 MG tablet Commonly known as:  FOLVITE Take 1 tablet (1 mg total) by mouth daily.   ibuprofen 800 MG tablet Commonly known as:  ADVIL,MOTRIN Take 1 tablet (800 mg total) by mouth 3 (three) times daily with meals as needed.   morphine 30 MG 12 hr tablet Commonly known as:  MS CONTIN Take 1 tablet (30 mg total) by mouth every 12 (twelve) hours for 30 days. Every  12 hours. Not every 8.   PrePLUS 27-1 MG Tabs Take 1 tablet by mouth daily.   senna-docusate 8.6-50 MG tablet Commonly known as:  Senokot-S Take 2 tablets by mouth 2 (two) times daily as needed for mild constipation or moderate constipation.     ASK your doctor about these medications   oxyCODONE 5 MG immediate release tablet Commonly known as:  Oxy IR/ROXICODONE Take 1 tablet (5 mg total) by mouth every 6 (six) hours as needed for up to 15 days for severe pain. Ask about: Should I take this medication?            Discharge Care Instructions  (From admission, onward)         Start     Ordered   02/08/19 0000  Discharge wound care:    Comments:  As per discharge handout and nursing instructions   02/08/19 1118          Diet: routine diet  Activity: Advance as tolerated. Pelvic rest for 6 weeks.   Outpatient follow up:2 weeks  Postpartum contraception: Depo Provera  Newborn Data: Live born female  Birth Weight: 6 lb 0.3 oz (2730 g) APGAR: 5, 7  Newborn Delivery   Birth date/time:  02/05/2019 13:38:00 Delivery type:  C-Section, Forcep Assisted Trial of labor:  Yes C-section categorization:  Primary     Baby Feeding: Bottle and Breast Disposition:NICU   Verita Schneiders, MD, [place], [facility] for [employer], [place]

## 2019-02-08 NOTE — Discharge Instructions (Signed)
Cesarean Delivery, Care After °This sheet gives you information about how to care for yourself after your procedure. Your health care provider may also give you more specific instructions. If you have problems or questions, contact your health care provider. °What can I expect after the procedure? °After the procedure, it is common to have: °· A small amount of blood or clear fluid coming from the incision. °· Some redness, swelling, and pain in your incision area. °· Some abdominal pain and soreness. °· Vaginal bleeding (lochia). Even though you did not have a vaginal delivery, you will still have vaginal bleeding and discharge. °· Pelvic cramps. °· Fatigue. °You may have pain, swelling, and discomfort in the tissue between your vagina and your anus (perineum) if: °· Your C-section was unplanned, and you were allowed to labor and push. °· An incision was made in the area (episiotomy) or the tissue tore during attempted vaginal delivery. °Follow these instructions at home: °Incision care ° °· Follow instructions from your health care provider about how to take care of your incision. Make sure you: °? Wash your hands with soap and water before you change your bandage (dressing). If soap and water are not available, use hand sanitizer. °? If you have a dressing, change it or remove it as told by your health care provider. °? Leave stitches (sutures), skin staples, skin glue, or adhesive strips in place. These skin closures may need to stay in place for 2 weeks or longer. If adhesive strip edges start to loosen and curl up, you may trim the loose edges. Do not remove adhesive strips completely unless your health care provider tells you to do that. °· Check your incision area every day for signs of infection. Check for: °? More redness, swelling, or pain. °? More fluid or blood. °? Warmth. °? Pus or a bad smell. °· Do not take baths, swim, or use a hot tub until your health care provider says it's okay. Ask your health  care provider if you can take showers. °· When you cough or sneeze, hug a pillow. This helps with pain and decreases the chance of your incision opening up (dehiscing). Do this until your incision heals. °Medicines °· Take over-the-counter and prescription medicines only as told by your health care provider. °· If you were prescribed an antibiotic medicine, take it as told by your health care provider. Do not stop taking the antibiotic even if you start to feel better. °· Do not drive or use heavy machinery while taking prescription pain medicine. °Lifestyle °· Do not drink alcohol. This is especially important if you are breastfeeding or taking pain medicine. °· Do not use any products that contain nicotine or tobacco, such as cigarettes, e-cigarettes, and chewing tobacco. If you need help quitting, ask your health care provider. °Eating and drinking °· Drink at least 8 eight-ounce glasses of water every day unless told not to by your health care provider. If you breastfeed, you may need to drink even more water. °· Eat high-fiber foods every day. These foods may help prevent or relieve constipation. High-fiber foods include: °? Whole grain cereals and breads. °? Brown rice. °? Beans. °? Fresh fruits and vegetables. °Activity ° °· If possible, have someone help you care for your baby and help with household activities for at least a few days after you leave the hospital. °· Return to your normal activities as told by your health care provider. Ask your health care provider what activities are safe for   you. °· Rest as much as possible. Try to rest or take a nap while your baby is sleeping. °· Do not lift anything that is heavier than 10 lbs (4.5 kg), or the limit that you were told, until your health care provider says that it is safe. °· Talk with your health care provider about when you can engage in sexual activity. This may depend on your: °? Risk of infection. °? How fast you heal. °? Comfort and desire to  engage in sexual activity. °General instructions °· Do not use tampons or douches until your health care provider approves. °· Wear loose, comfortable clothing and a supportive and well-fitting bra. °· Keep your perineum clean and dry. Wipe from front to back when you use the toilet. °· If you pass a blood clot, save it and call your health care provider to discuss. Do not flush blood clots down the toilet before you get instructions from your health care provider. °· Keep all follow-up visits for you and your baby as told by your health care provider. This is important. °Contact a health care provider if: °· You have: °? A fever. °? Bad-smelling vaginal discharge. °? Pus or a bad smell coming from your incision. °? Difficulty or pain when urinating. °? A sudden increase or decrease in the frequency of your bowel movements. °? More redness, swelling, or pain around your incision. °? More fluid or blood coming from your incision. °? A rash. °? Nausea. °? Little or no interest in activities you used to enjoy. °? Questions about caring for yourself or your baby. °· Your incision feels warm to the touch. °· Your breasts turn red or become painful or hard. °· You feel unusually sad or worried. °· You vomit. °· You pass a blood clot from your vagina. °· You urinate more than usual. °· You are dizzy or light-headed. °Get help right away if: °· You have: °? Pain that does not go away or get better with medicine. °? Chest pain. °? Difficulty breathing. °? Blurred vision or spots in your vision. °? Thoughts about hurting yourself or your baby. °? New pain in your abdomen or in one of your legs. °? A severe headache. °· You faint. °· You bleed from your vagina so much that you fill more than one sanitary pad in one hour. Bleeding should not be heavier than your heaviest period. °Summary °· After the procedure, it is common to have pain at your incision site, abdominal cramping, and slight bleeding from your vagina. °· Check  your incision area every day for signs of infection. °· Tell your health care provider about any unusual symptoms. °· Keep all follow-up visits for you and your baby as told by your health care provider. °This information is not intended to replace advice given to you by your health care provider. Make sure you discuss any questions you have with your health care provider. °Document Released: 07/10/2002 Document Revised: 04/26/2018 Document Reviewed: 04/26/2018 °Elsevier Interactive Patient Education © 2019 [employer] Inc[employer] ° °

## 2019-02-08 NOTE — Progress Notes (Signed)
Subjective: Patient is in tears due to worsening pain in her back and legs. Has been improving steadily but does not think she is ready to go home yet. No fever, no NVD.  Objective: Vital signs in last 24 hours: Temp:  [98 F (36.7 C)-98.6 F (37 C)] 98.2 F (36.8 C) (04/09 0800) Pulse Rate:  [83-90] 83 (04/09 0800) Resp:  [16-19] 18 (04/09 0800) BP: (116-124)/(66-93) 124/80 (04/09 0800) SpO2:  [97 %-100 %] 100 % (04/09 0800) FiO2 (%):  [2 %] 2 % (04/09 0800) Weight change:     Intake/Output from previous day: 04/08 0701 - 04/09 0700 In: 580 [P.O.:580] Out: 2900 [Urine:2900] Intake/Output this shift: Total I/O In: -  Out: 1700 [Urine:1700]  General appearance: alert and no distress Back: symmetric, no curvature. ROM normal. No CVA tenderness. Resp: clear to auscultation bilaterally Cardio: regular rate and rhythm, S1, S2 normal, no murmur, click, rub or gallop GI: soft, non-tender; bowel sounds normal; no masses,  no organomegaly Extremities: extremities normal, atraumatic, no cyanosis or edema Pulses: 2+ and symmetric Skin: Skin color, texture, turgor normal. No rashes or lesions Neurologic: Grossly normal  Lab Results: Recent Labs    02/06/19 1808 02/07/19 0942  WBC 27.1* 24.0*  HGB 7.5* 7.6*  HCT 22.2* 21.9*  PLT 433* 471*   BMET Recent Labs    02/05/19 1621 02/07/19 0944  NA  --  137  K  --  3.9  CL  --  107  CO2  --  23  GLUCOSE  --  78  BUN  --  6  CREATININE 0.79 0.75  CALCIUM  --  8.3*    Studies/Results: No results found.  Medications: I have reviewed the patient's current medications.  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  #1 sickle cell painful crisis: Improving. Patient still uses a lot of Dilaudid at 35 mg overnight. We will continue titrate down the Dilaudid PCA and  Continue her long-acting morphine. Will schedule short acting today for planned DC in the morning.  #2 anemia of chronic  disease: Hemoglobin is stable at 7.2g after transfusion.  #3 hyponatremia: Mild.  Postpartum.  Mild fluid resuscitation.  #4 chronic pain syndrome: Resume long-acting morphine.   LOS: 4 days   ,LAWAL 02/08/2019, 11:44 AM

## 2019-02-08 NOTE — Progress Notes (Signed)
Subjective: Postpartum Day 3: Cesarean Delivery Patient reports excessive leg swelling and leg pain Minimal incisional discomfort.    Objective: Vital signs in last 24 hours: Temp:  [98 F (36.7 C)-98.6 F (37 C)] 98.6 F (37 C) (04/08 1928) Pulse Rate:  [72-90] 90 (04/08 2255) Resp:  [15-19] 18 (04/09 0605) BP: (110-124)/(60-93) 118/73 (04/08 2255) SpO2:  [92 %-100 %] 97 % (04/09 3559)  Physical Exam:  General: alert, cooperative and no distress Lochia: appropriate Uterine Fundus: firm Incision: healing well, no significant drainage, no dehiscence, no significant erythema DVT Evaluation: No evidence of DVT seen on physical exam.  Recent Labs    02/06/19 1808 02/07/19 0942  HGB 7.5* 7.6*  HCT 22.2* 21.9*    Assessment/Plan: Status post Cesarean section. Doing well postoperatively. specifically obstetrically, continues to have challenges regarding her pain crisis management, Sickle cell team continuing to follow Continue current care. Ready for discharge for obstetric standpoint, continue to follow but ultimate disposition to be decided by sickle cell team Give IV dose of lasix today Florian Buff 02/08/2019, 7:20 AM

## 2019-02-08 NOTE — Progress Notes (Signed)
Pt refused DEPO

## 2019-02-09 ENCOUNTER — Inpatient Hospital Stay (HOSPITAL_COMMUNITY): Payer: Medicaid Other

## 2019-02-09 LAB — URINALYSIS, ROUTINE W REFLEX MICROSCOPIC
Bilirubin Urine: NEGATIVE
Glucose, UA: NEGATIVE mg/dL
Ketones, ur: 20 mg/dL — AB
Nitrite: NEGATIVE
Protein, ur: NEGATIVE mg/dL
Specific Gravity, Urine: 1.011 (ref 1.005–1.030)
pH: 6 (ref 5.0–8.0)

## 2019-02-09 MED ORDER — ACETAMINOPHEN 500 MG PO TABS
1000.0000 mg | ORAL_TABLET | Freq: Four times a day (QID) | ORAL | Status: DC | PRN
  Administered 2019-02-09: 21:00:00 1000 mg via ORAL
  Filled 2019-02-09: qty 2

## 2019-02-09 NOTE — Progress Notes (Signed)
Daily Postpartum Note  Admission Date: 02/04/2019 Current Date: 02/09/2019 10:11 AM  Carmen [name] is a 28 y.o. G2P1011 POD#4 s/p pLTCS for arrest of dilation and NRFHTs, admitted for PROM  Pregnancy complicated by: Patient Active Problem List   Diagnosis Date Noted  . S/P cesarean section 02/04/2019  . Sickle cell crisis ([place]) 01/25/2019  . Sickle cell pain crisis ([place]) 12/04/2018  . Alpha thalassemia silent carrier 12/04/2018  . 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 Pain in extremity   . Supervision of high risk pregnancy, antepartum 08/17/2018  . Chronic prescription opiate use 03/13/2018  . Chronic musculoskeletal pain 03/13/2018  . Vitamin D deficiency 01/13/2018  . Leukocytosis 08/21/2017  . 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 Hb-SS disease without crisis ([place]) 08/15/2016  . Anemia of chronic disease   . Systolic ejection murmur 51/88/4166  . 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 Minimal lochia. Abdominal pain minimal. +flatus and taking PO  Objective:    Current Vital Signs 24h Vital Sign Ranges  T 98.6 F (37 C) Temp  Avg: 98.6 F (37 C)  Min: 98.3 F (36.8 C)  Max: 98.9 F (37.2 C)  BP 122/77 BP  Min: 115/68  Max: 132/79  HR 100 Pulse  Avg: 97.3  Min: 84  Max: 109  RR 16 Resp  Avg: 17.8  Min: 16  Max: 18  SaO2 100 % Nasal Cannula SpO2  Avg: 100 %  Min: 100 %  Max: 100 %       24 Hour I/O Current Shift I/O  Time Ins Outs 04/09 0701 - 04/10 0700 In: -  Out: 2800 [Urine:2800] No intake/output data recorded.   Patient Vitals for the past 24 hrs:  BP Temp Temp src Pulse Resp SpO2  02/09/19 0830 122/77 98.6 F (37 C) Oral 100 16 100 %  02/09/19 0538 - - - - 18 100 %  02/09/19 0348 120/67 98.8 F (37.1 C) Oral (!) 109 18 100 %  02/09/19 0206 - - - -  18 100 %  02/08/19 2346 132/79 98.9 F (37.2 C) Oral (!) 102 18 100 %  02/08/19 2216 - - - - 18 100 %  02/08/19 1957 115/68 98.3 F (36.8 C) Oral 96 - 100 %  02/08/19 1652 120/73 98.5 F (36.9 C) Oral 92 18 100 %  02/08/19 1208 126/74 - - 98 - -  02/08/19 1200 126/74 98.5 F (36.9 C) Oral 84 18 100 %    Physical exam: General: Well nourished, well developed female in no acute distress. Abdomen: +BS, soft, nttp, nd. C/d/i with steri strips in place Cardiovascular: S1, S2 normal, no murmur, rub or gallop, regular rate and rhythm Respiratory: CTAB, no resp distress Extremities: no clubbing, cyanosis or edema Skin: Warm and dry.   Medications: Current Facility-Administered Medications  Medication Dose Route Frequency Provider Last Rate Last Dose  . 0.9 %  sodium chloride infusion (Manually program via Guardrails IV Fluids)   Intravenous Once Elwyn Reach, MD      . coconut oil  1 application Topical PRN [name], MD      . witch hazel-glycerin (TUCKS) pad 1 application  1 application Topical PRN [name], MD       And  . dibucaine (  NUPERCAINAL) 1 % rectal ointment 1 application  1 application Rectal PRN [name], MD      . diphenhydrAMINE (BENADRYL) injection 12.5 mg  12.5 mg Intravenous Q4H PRN Duane Boston, MD       Or  . diphenhydrAMINE (BENADRYL) capsule 25 mg  25 mg Oral Q4H PRN Duane Boston, MD      . diphenhydrAMINE (BENADRYL) capsule 25 mg  25 mg Oral Q6H PRN [name], MD      . enoxaparin (LOVENOX) injection 40 mg  40 mg Subcutaneous Q24H [name], MD   40 mg at 02/09/19 0825  . furosemide (LASIX) tablet 20 mg  20 mg Oral Daily Anyanwu, Ugonna A, MD   20 mg at 02/08/19 1003  . HYDROmorphone (DILAUDID) 1 mg/mL PCA injection   Intravenous Q4H [name], MD      . ibuprofen (ADVIL,MOTRIN) tablet 800 mg  800 mg Oral TID [name], MD   800 mg at 02/08/19 2216  . lactated ringers infusion   Intravenous Continuous  Anyanwu, Sallyanne Havers, MD 20 mL/hr at 02/09/19 0832    . medroxyPROGESTERone (DEPO-PROVERA) injection 150 mg  150 mg Intramuscular Once Anyanwu, Ugonna A, MD      . menthol-cetylpyridinium (CEPACOL) lozenge 3 mg  1 lozenge Oral Q2H PRN [name], MD      . morphine (MS CONTIN) 12 hr tablet 30 mg  30 mg Oral Q12H Gala Romney L, MD   30 mg at 02/08/19 2216  . nalbuphine (NUBAIN) injection 5 mg  5 mg Intravenous Q4H PRN Duane Boston, MD       Or  . nalbuphine (NUBAIN) injection 5 mg  5 mg Subcutaneous Q4H PRN Duane Boston, MD      . nalbuphine (NUBAIN) injection 5 mg  5 mg Intravenous Once PRN Duane Boston, MD       Or  . nalbuphine (NUBAIN) injection 5 mg  5 mg Subcutaneous Once PRN Duane Boston, MD      . naloxone [facility]) injection 0.4 mg  0.4 mg Intravenous PRN Duane Boston, MD       And  . sodium chloride flush (NS) 0.9 % injection 3 mL  3 mL Intravenous PRN Duane Boston, MD      . naloxone HCl [facility]) 2 mg in dextrose 5 % 250 mL infusion  1-4 mcg/kg/hr Intravenous Continuous PRN Duane Boston, MD      . ondansetron [facility]) injection 4 mg  4 mg Intravenous Q8H PRN Duane Boston, MD      . oxyCODONE-acetaminophen (PERCOCET/ROXICET) 5-325 MG per tablet 1 tablet  1 tablet Oral Q6H Elwyn Reach, MD   1 tablet at 02/09/19 0538  . oxyCODONE-acetaminophen (PERCOCET/ROXICET) 5-325 MG per tablet 2 tablet  2 tablet Oral Q4H PRN [name], MD   2 tablet at 02/09/19 0107  . prenatal multivitamin tablet 1 tablet  1 tablet Oral Q1200 [name], MD   1 tablet at 02/08/19 1205  . senna-docusate (Senokot-S) tablet 2 tablet  2 tablet Oral Q24H [name], MD   2 tablet at 02/08/19 2345  . simethicone (MYLICON) chewable tablet 80 mg  80 mg Oral TID PC [name], MD   80 mg at 02/09/19 0825  . simethicone (MYLICON) chewable tablet 80 mg  80 mg Oral Q24H [name], MD   80 mg at 02/08/19 2346  . simethicone (MYLICON) chewable tablet 80 mg  80 mg Oral PRN  [name], MD      . Tdap Durwin Reges) injection 0.5 mL  0.5 mL Intramuscular Once [name], MD   Stopped at 02/06/19 1047  . zolpidem (AMBIEN) tablet 5 mg  5 mg Oral QHS PRN [name], MD        Labs:  Recent Labs  Lab 02/06/19 0541 02/06/19 1808 02/07/19 0942  WBC 27.2* 27.1* 24.0*  HGB 6.2* 7.5* 7.6*  HCT 18.4* 22.2* 21.9*  PLT 359 433* 471*    Recent Labs  Lab 02/02/19 1309 02/05/19 1621 02/07/19 0944  NA 134*  --  137  K 3.5  --  3.9  CL 105  --  107  CO2 23  --  23  BUN 8  --  6  CREATININE 0.64 0.79 0.75  CALCIUM 8.6*  --  8.3*  PROT  --   --  5.6*  BILITOT  --   --  1.3*  ALKPHOS  --   --  256*  ALT  --   --  22  AST  --   --  38  GLUCOSE 111*  --  78     Radiology: no new imaging  Assessment & Plan:  ?pna *PP: routine care. Unsure about BC. Was going to get ppBTL but unable to due to coronavirus OR restrictions on elective cases. Doesn't want to do depo. Likely [facility] at [facility] visit. Doing well and can go home from an OB POV *Faith crisis: [place] team following and pt still on PCA. I ordered CXR 2v and will touch base with [place] team after they see her today about transferring her to their service if d/c doesn't seem likely today or tomorrow *PPx: SCDs, qday lovenox *FEN/GI:regular diet, KVO *Dispo:  See above.   Durene Romans MD Attending Center for [facility])

## 2019-02-09 NOTE — Progress Notes (Signed)
Subjective: Patient is in tears due to worsening pain in her back and legs.  Patient doing much better except she ran a temperature this morning with some shortness of breath and chest pain.  She has no evidence of sick contacts.  She has not been moving that much.  Chest x-ray showed bilateral atelectasis versus infiltrates.  Objective: Vital signs in last 24 hours: Temp:  [98.3 F (36.8 C)-98.9 F (37.2 C)] 98.8 F (37.1 C) (04/10 0348) Pulse Rate:  [84-109] 109 (04/10 0348) Resp:  [18] 18 (04/10 0538) BP: (115-132)/(67-80) 120/67 (04/10 0348) SpO2:  [100 %] 100 % (04/10 0538) Weight change:     Intake/Output from previous day: 04/09 0701 - 04/10 0700 In: -  Out: 2800 [Urine:2800] Intake/Output this shift: No intake/output data recorded.  General appearance: alert and no distress Back: symmetric, no curvature. ROM normal. No CVA tenderness. Resp: clear to auscultation bilaterally Cardio: regular rate and rhythm, S1, S2 normal, no murmur, click, rub or gallop GI: soft, non-tender; bowel sounds normal; no masses,  no organomegaly Extremities: extremities normal, atraumatic, no cyanosis or edema Pulses: 2+ and symmetric Skin: Skin color, texture, turgor normal. No rashes or lesions Neurologic: Grossly normal  Lab Results: Recent Labs    02/06/19 1808 02/07/19 0942  WBC 27.1* 24.0*  HGB 7.5* 7.6*  HCT 22.2* 21.9*  PLT 433* 471*   BMET Recent Labs    02/07/19 0944  NA 137  K 3.9  CL 107  CO2 23  GLUCOSE 78  BUN 6  CREATININE 0.75  CALCIUM 8.3*    Studies/Results: No results found.  Medications: I have reviewed the patient's current medications.  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  #1  Fever with mild shortness of breath: Suspected pneumonia versus PE in sickle cell patient with recent surgery.  Patient will get CT angiogram of the chest if negative most likely atelectasis due to not breathing much  postoperatively.  Patient may be discharged tomorrow if doing better  #2 sickle cell painful crisis: Improving. Patient still uses a lot of Dilaudid at 35 mg overnight. We will continue titrate down the Dilaudid PCA and  Continue her long-acting morphine. Will schedule short acting today for planned DC in the morning.  #3 anemia of chronic disease: Hemoglobin is stable at 7.2g after transfusion.  #4 hyponatremia: Mild.  Postpartum.  Mild fluid resuscitation.  #5 chronic pain syndrome: Resume long-acting morphine.   LOS: 5 days   ,LAWAL 02/09/2019, 8:01 AM

## 2019-02-10 ENCOUNTER — Inpatient Hospital Stay (HOSPITAL_COMMUNITY): Payer: Medicaid Other

## 2019-02-10 LAB — BUN: BUN: 6 mg/dL (ref 6–20)

## 2019-02-10 LAB — CREATININE, SERUM
Creatinine, Ser: 0.73 mg/dL (ref 0.44–1.00)
GFR calc Af Amer: >60 mL/min (ref >60)
GFR calc non Af Amer: >60 mL/min (ref >60)

## 2019-02-10 LAB — CK: Total CK: 38 U/L (ref 38–234)

## 2019-02-10 MED ORDER — IOHEXOL 350 MG/ML SOLN
75.0000 mL | Freq: Once | INTRAVENOUS | Status: AC | PRN
  Administered 2019-02-10: 03:00:00 75 mL via INTRAVENOUS

## 2019-02-10 NOTE — Progress Notes (Signed)
Subjective: Postpartum Day 5: Cesarean Delivery Patient reports tolerating PO.  Recent fever to 100.6. No evidence of PE. On lovenox. Has SCD and h/o acute chest. CT shows atelectasis. Denies cough, exposure to COVID patients.  Objective: Vital signs in last 24 hours: Temp:  [97.9 F (36.6 C)-100.6 F (38.1 C)] 98.5 F (36.9 C) (04/11 0933) Pulse Rate:  [89-129] 96 (04/11 0933) Resp:  [14-22] 21 (04/11 0933) BP: (106-156)/(64-111) 112/65 (04/11 0933) SpO2:  [96 %-100 %] 100 % (04/11 0933)  Physical Exam:  General: alert, cooperative and appears stated age 28: appropriate Uterine Fundus: firm, non-tender Incision: healing well, no significant drainage, no significant erythema DVT Evaluation: No evidence of DVT seen on physical exam. 2-3+ edema  Assessment/Plan: Status post Cesarean section. Doing well postoperatively. From an OB standpoint, would be ready for discharge. If continued inpatient care is warranted, could be transitioned to sickle cell medicine service. Continue current care per sickle cell medicine.  Carmen [name] 02/10/2019, 11:03 AM [phone]

## 2019-02-10 NOTE — Progress Notes (Signed)
Subjective: Patient is doing much better.  Pain is much better controlled.  CT chest showed no PE and no other significant findings.  Pain is much better controlled.  No shortness of breath today.  Objective: Vital signs in last 24 hours: Temp:  [97.9 F (36.6 C)-100.6 F (38.1 C)] 98.5 F (36.9 C) (04/11 0933) Pulse Rate:  [89-129] 96 (04/11 0933) Resp:  [14-22] 21 (04/11 0933) BP: (106-156)/(64-111) 112/65 (04/11 0933) SpO2:  [96 %-100 %] 100 % (04/11 0933) Weight change:     Intake/Output from previous day: 04/10 0701 - 04/11 0700 In: 1437.2 [I.V.:1437.2] Out: -  Intake/Output this shift: No intake/output data recorded.  General appearance: alert and no distress Back: symmetric, no curvature. ROM normal. No CVA tenderness. Resp: clear to auscultation bilaterally Cardio: regular rate and rhythm, S1, S2 normal, no murmur, click, rub or gallop GI: soft, non-tender; bowel sounds normal; no masses,  no organomegaly Extremities: extremities normal, atraumatic, no cyanosis or edema Pulses: 2+ and symmetric Skin: Skin color, texture, turgor normal. No rashes or lesions Neurologic: Grossly normal  Lab Results: No results for input(s): WBC, HGB, HCT, PLT in the last 72 hours. BMET Recent Labs    02/10/19 0020  BUN 6  CREATININE 0.73    Studies/Results: Dg Chest 2 View  Result Date: 02/09/2019 CLINICAL DATA:  Chest pain and shortness of breath. Recent C-section. History of sickle cell anemia. EXAM: CHEST - 2 VIEW COMPARISON:  08/24/2017 FINDINGS: The cardiomediastinal silhouette is unchanged with normal heart size. The lungs are mildly hypoinflated with minimal bibasilar opacity, likely atelectasis. No edema, pleural effusion, pneumothorax is identified. Right upper quadrant abdominal surgical clips are noted. No acute osseous abnormality is seen. IMPRESSION: Minimal bibasilar atelectasis. Electronically Signed   By: Logan Bores M.D.   On: 02/09/2019 11:06   Ct Angio Chest Pe  W Or Wo Contrast  Result Date: 02/10/2019 CLINICAL DATA:  Initial evaluation for acute chest pain, shortness of breath, recent Labor and delivery. EXAM: CT ANGIOGRAPHY CHEST WITH CONTRAST TECHNIQUE: Multidetector CT imaging of the chest was performed using the standard protocol during bolus administration of intravenous contrast. Multiplanar CT image reconstructions and MIPs were obtained to evaluate the vascular anatomy. CONTRAST:  69mL OMNIPAQUE IOHEXOL 350 MG/ML SOLN COMPARISON:  Prior radiograph from 02/09/2019. FINDINGS: Cardiovascular: Intrathoracic aorta normal in caliber without aneurysm or other acute finding. Visualized great vessels within normal limits. Heart size normal. No pericardial effusion. Pulmonary arterial tree adequately opacified for evaluation. Main pulmonary artery within normal limits for caliber measuring 2.8 cm in diameter. No filling defect to suggest acute pulmonary embolism. Re-formatted imaging confirms these findings. Mediastinum/Nodes: Visualized thyroid within normal limits. No enlarged mediastinal, hilar, or axillary lymph nodes. Soft tissue density within the anterior mediastinum likely reflects normal residual thymic tissue. Esophagus within normal limits. Lungs/Pleura: Tracheobronchial tree intact and patent. Lungs normally inflated. Small layering bilateral pleural effusions. Associated dependent atelectatic changes present within both lungs. No focal infiltrates or consolidative opacity. No pulmonary edema. No pneumothorax. No worrisome pulmonary nodule or mass. Upper Abdomen: Visualized upper abdomen demonstrates no acute finding. Musculoskeletal: No acute osseous abnormality. No discrete lytic or blastic osseous lesions. Diffuse anasarca noted. Parenchymal changes consistent with recent just a shin noted within both breasts. Review of the MIP images confirms the above findings. IMPRESSION: 1. No CT evidence for acute pulmonary embolism. 2. Small layering bilateral pleural  effusions with associated atelectasis. 3. Anasarca. Electronically Signed   By: Jeannine Boga M.D.   On: 02/10/2019  04:08    Medications: I have reviewed the patient's current medications.  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  #1  Fever with mild shortness of breath: Fever has resolved and patient is doing better.  She will medically be ready for discharge home.  #2 sickle cell painful crisis: Improved.  Patient should continue her home regimen.  She will be discharged home.  Please refer to discharge summary by Dr. Harolyn Rutherford.  #3 anemia of chronic disease: Hemoglobin is stable.   #4 hyponatremia: Mild.  Postpartum.  Mild fluid resuscitation.  #5 chronic pain syndrome: Resume long-acting morphine.   LOS: 6 days   ,LAWAL 02/10/2019, 10:10 AM

## 2019-02-10 NOTE — Progress Notes (Deleted)
8 mg. Of hydromorphone PCA wasted in hazardous narcotics waste container. Witnessed by Evert Kohl, RN.

## 2019-02-10 NOTE — Progress Notes (Signed)
8 mg. Of hydromorphone PCA wasted in hazardous narcotics waste container. Witnessed by Evert Kohl, RN.

## 2019-02-10 NOTE — Progress Notes (Signed)
Witnessed Eleanora Neighbor, RN wasting 8 mg of Hydromorphone in hazardous waste container.

## 2019-02-15 ENCOUNTER — Ambulatory Visit (INDEPENDENT_AMBULATORY_CARE_PROVIDER_SITE_OTHER): Payer: Medicaid Other | Admitting: Family Medicine

## 2019-02-15 ENCOUNTER — Encounter: Payer: Self-pay | Admitting: Family Medicine

## 2019-02-15 ENCOUNTER — Other Ambulatory Visit: Payer: Self-pay

## 2019-02-15 ENCOUNTER — Telehealth: Payer: Self-pay

## 2019-02-15 DIAGNOSIS — D571 Sickle-cell disease without crisis: Secondary | ICD-10-CM | POA: Diagnosis not present

## 2019-02-15 DIAGNOSIS — Z79891 Long term (current) use of opiate analgesic: Secondary | ICD-10-CM | POA: Diagnosis not present

## 2019-02-15 MED ORDER — MORPHINE SULFATE ER 60 MG PO TBCR: 60.0000 mg | EXTENDED_RELEASE_TABLET | Freq: Two times a day (BID) | ORAL | 0 refills | Status: DC

## 2019-02-15 MED ORDER — OXYCODONE HCL 10 MG PO TABS: 10.0000 mg | ORAL_TABLET | Freq: Four times a day (QID) | ORAL | 0 refills | Status: DC | PRN

## 2019-02-15 NOTE — Telephone Encounter (Signed)
Patient given a different script earlier today.

## 2019-02-15 NOTE — Progress Notes (Signed)
  Patient [facility]  Virtual Visit via Telephone Note  I connected with Carmen [name] on 02/15/19 at  9:20 AM EDT by telephone and verified that I am speaking with the correct person using two identifiers.   I discussed the limitations, risks, security and privacy concerns of performing an evaluation and management service by telephone and the availability of in person appointments. I also discussed with the patient that there may be a patient responsible charge related to this service. The patient expressed understanding and agreed to proceed.   History of Present Illness: The patient went for cesarean section due to Arrest of Dilation, and delivered a Viable infant,02/05/2019 Carmen [name]).  She was given medication while hospitalized. Today, she reports increased pain due to SCD and c-section. Patient would like to discuss changing her medications to what she was taking prior to pregnancy.  She denies chest pain, SOB, dizziness or leg swelling.    Observations/Objective: Patient with regular voice tone, rate and rhythm. Speaking calmly and is in no apparent distress.    Assessment and Plan: 1. Hb-SS disease without crisis ([place]) Will need to consider re-starting hydrourea therapy, oxybryta or endari to help with SCD crises and pain.  - morphine (MS CONTIN) 60 MG 12 hr tablet; Take 1 tablet (60 mg total) by mouth every 12 (twelve) hours for 15 days.  Dispense: 30 tablet; Refill: 0 - Oxycodone HCl 10 MG TABS; Take 1 tablet (10 mg total) by mouth every 6 (six) hours as needed for up to 15 days (PAIN).  Dispense: 60 tablet; Refill: 0  2. Chronic prescription opiate use - morphine (MS CONTIN) 60 MG 12 hr tablet; Take 1 tablet (60 mg total) by mouth every 12 (twelve) hours for 15 days.  Dispense: 30 tablet; Refill: 0 - Oxycodone HCl 10 MG TABS; Take 1 tablet (10 mg total) by mouth every 6 (six) hours as needed for up to 15 days (PAIN).  Dispense:  60 tablet; Refill: 0   Follow Up Instructions:  .We discussed hand washing, using hand sanitizer when soap and water are not available, only going out when absolutely necessary, and social distancing. Explained to patient that she is immunocompromised and will need to take precautions during this time.   I discussed the assessment and treatment plan with the patient. The patient was provided an opportunity to ask questions and all were answered. The patient agreed with the plan and demonstrated an understanding of the instructions.   The patient was advised to call back or seek an in-person evaluation if the symptoms worsen or if the condition fails to improve as anticipated.  I provided 10 minutes of non-face-to-face time during this encounter.  [name], FNP-BC Patient [facility] [address] [place], [place] [address] [phone]

## 2019-02-15 NOTE — Patient Instructions (Signed)
Sickle Cell Anemia, Adult °Sickle cell anemia is a condition where your red blood cells are shaped like sickles. Red blood cells carry oxygen through the body. Sickle-shaped cells do not live as long as normal red blood cells. They also clump together and block blood from flowing through the blood vessels. This prevents the body from getting enough oxygen. Sickle cell anemia causes organ damage and pain. It also increases the risk of infection. °Follow these instructions at home: °Medicines °· Take over-the-counter and prescription medicines only as told by your doctor. °· If you were prescribed an antibiotic medicine, take it as told by your doctor. Do not stop taking the antibiotic even if you start to feel better. °· If you develop a fever, do not take medicines to lower the fever right away. Tell your doctor about the fever. °Managing pain, stiffness, and swelling °· Try these methods to help with pain: °? Use a heating pad. °? Take a warm bath. °? Distract yourself, such as by watching TV. °Eating and drinking °· Drink enough fluid to keep your pee (urine) clear or pale yellow. Drink more in hot weather and during exercise. °· Limit or avoid alcohol. °· Eat a healthy diet. Eat plenty of fruits, vegetables, whole grains, and lean protein. °· Take vitamins and supplements as told by your doctor. °Traveling °· When traveling, keep these with you: °? Your medical information. °? The names of your doctors. °? Your medicines. °· If you need to take an airplane, talk to your doctor first. °Activity °· Rest often. °· Avoid exercises that make your heart beat much faster, such as jogging. °General instructions °· Do not use products that have nicotine or tobacco, such as cigarettes and e-cigarettes. If you need help quitting, ask your doctor. °· Consider wearing a medical alert bracelet. °· Avoid being in high places (high altitudes), such as mountains. °· Avoid very hot or cold temperatures. °· Avoid places where the  temperature changes a lot. °· Keep all follow-up visits as told by your doctor. This is important. °Contact a doctor if: °· A joint hurts. °· Your feet or hands hurt or swell. °· You feel tired (fatigued). °Get help right away if: °· You have symptoms of infection. These include: °? Fever. °? Chills. °? Being very tired. °? Irritability. °? Poor eating. °? Throwing up (vomiting). °· You feel dizzy or faint. °· You have new stomach pain, especially on the left side. °· You have a an erection (priapism) that lasts more than 4 hours. °· You have numbness in your arms or legs. °· You have a hard time moving your arms or legs. °· You have trouble talking. °· You have pain that does not go away when you take medicine. °· You are short of breath. °· You are breathing fast. °· You have a long-term cough. °· You have pain in your chest. °· You have a bad headache. °· You have a stiff neck. °· Your stomach looks bloated even though you did not eat much. °· Your skin is pale. °· You suddenly cannot see well. °Summary °· Sickle cell anemia is a condition where your red blood cells are shaped like sickles. °· Follow your doctor's advice on ways to manage pain, food to eat, activities to do, and steps to take for safe travel. °· Get medical help right away if you have any signs of infection, such as a fever. °This information is not intended to replace advice given to you by your   health care provider. Make sure you discuss any questions you have with your health care provider. °Document Released: 08/08/2013 Document Revised: 11/23/2016 Document Reviewed: 11/23/2016 °Elsevier Interactive Patient Education © 2019 [employer]vier Inc. ° °

## 2019-02-20 ENCOUNTER — Telehealth: Payer: Self-pay | Admitting: Family Medicine

## 2019-02-20 NOTE — Telephone Encounter (Signed)
Attempted to call patient with you incision and postpartum check appointments. No answer, left detailed message with the appointments as well as office number if needing to reschedule.

## 2019-02-26 ENCOUNTER — Ambulatory Visit: Payer: Self-pay

## 2019-02-27 ENCOUNTER — Ambulatory Visit (INDEPENDENT_AMBULATORY_CARE_PROVIDER_SITE_OTHER): Payer: Medicaid Other

## 2019-02-27 ENCOUNTER — Other Ambulatory Visit: Payer: Self-pay

## 2019-02-27 VITALS — BP 115/80 | HR 94 | Wt 157.2 lb

## 2019-02-27 DIAGNOSIS — Z5189 Encounter for other specified aftercare: Secondary | ICD-10-CM

## 2019-02-27 NOTE — Progress Notes (Signed)
Pt here today for incision check s/p section on 02/05/19.  Pt denies any pain and is having some scant bleeding changing a pad every 6-8 hours.  Pt incision is well approximated, no odor, no drainage, and no redness.  Removed old steri strips, cleansed with NS, and replaced three steri strips to incision.  Informed pt that she should continue to monitor for infection and to call the office with concerns.  Verified pt's pp visit. Pt stated understanding.

## 2019-03-01 ENCOUNTER — Telehealth: Payer: Self-pay | Admitting: Lactation Services

## 2019-03-01 DIAGNOSIS — Z9189 Other specified personal risk factors, not elsewhere classified: Secondary | ICD-10-CM

## 2019-03-01 NOTE — Telephone Encounter (Signed)
Carmen [name] from [facility] called in to report that the spoke with pt this morning on the phone.   Pt indicates that she is feeling depressed and anxious. Her Edinburgh Scale this morning was a 12. She reports pt stated she does not have ideation to harm herself or her infant. She is asking that pt be called for follow up. Routed to clinical pool and Vesta Mixer, [facility] Specialist for follow up.

## 2019-03-02 NOTE — Progress Notes (Signed)
Chart reviewed for nurse visit. Agree with plan of care.   Carmen [name], [place] 03/02/2019 9:38 AM

## 2019-03-05 NOTE — Addendum Note (Signed)
Addended by: Dolores Hoose on: 03/05/2019 10:58 AM   Modules accepted: Orders

## 2019-03-05 NOTE — Telephone Encounter (Signed)
Spoke with Dr. Ilda Basset regarding pt's elevated Carmen [name].  Order placed for referral to integrated behavioral health.  [place] notified.  Will notify front office to schedule appointment for pt.

## 2019-03-06 ENCOUNTER — Other Ambulatory Visit: Payer: Self-pay | Admitting: Internal Medicine

## 2019-03-06 ENCOUNTER — Telehealth: Payer: Self-pay

## 2019-03-06 DIAGNOSIS — Z79891 Long term (current) use of opiate analgesic: Secondary | ICD-10-CM

## 2019-03-06 DIAGNOSIS — D571 Sickle-cell disease without crisis: Secondary | ICD-10-CM

## 2019-03-06 MED ORDER — MORPHINE SULFATE ER 60 MG PO TBCR: 60.0000 mg | EXTENDED_RELEASE_TABLET | Freq: Two times a day (BID) | ORAL | 0 refills | Status: DC

## 2019-03-06 MED ORDER — OXYCODONE HCL 10 MG PO TABS: 10.0000 mg | ORAL_TABLET | Freq: Four times a day (QID) | ORAL | 0 refills | Status: DC | PRN

## 2019-03-06 NOTE — Telephone Encounter (Signed)
Refilled medications

## 2019-03-08 ENCOUNTER — Telehealth: Payer: Self-pay

## 2019-03-08 DIAGNOSIS — Z1331 Encounter for screening for depression: Secondary | ICD-10-CM

## 2019-03-08 NOTE — Telephone Encounter (Signed)
Carmen [name] from [facility] called and stated that she wanted to check on the pt since she scored a 12 on her depression screen.  I called Carmen [name] and informed her that [name] will follow up with her.  Carmen [name] stated thank you with no further questions.

## 2019-03-08 NOTE — Telephone Encounter (Signed)
Message sent to provider 

## 2019-03-14 ENCOUNTER — Telehealth: Payer: Self-pay | Admitting: Family Medicine

## 2019-03-14 NOTE — Telephone Encounter (Signed)
Attempted to call patient about her seeing Roselyn Reef. Left a VM message for her to call us.

## 2019-03-15 ENCOUNTER — Encounter: Payer: Self-pay | Admitting: Family Medicine

## 2019-03-15 ENCOUNTER — Other Ambulatory Visit: Payer: Self-pay

## 2019-03-15 ENCOUNTER — Ambulatory Visit (INDEPENDENT_AMBULATORY_CARE_PROVIDER_SITE_OTHER): Payer: Medicaid Other | Admitting: Family Medicine

## 2019-03-15 VITALS — BP 111/70 | HR 87 | Temp 98.4°F | Resp 14 | Ht 63.0 in | Wt 158.0 lb

## 2019-03-15 DIAGNOSIS — D571 Sickle-cell disease without crisis: Secondary | ICD-10-CM | POA: Diagnosis not present

## 2019-03-15 LAB — POCT URINALYSIS DIPSTICK
Bilirubin, UA: NEGATIVE
Blood, UA: NEGATIVE
Glucose, UA: NEGATIVE
Ketones, UA: NEGATIVE
Nitrite, UA: NEGATIVE
Protein, UA: NEGATIVE
Spec Grav, UA: 1.01 (ref 1.010–1.025)
Urobilinogen, UA: 0.2 E.U./dL
pH, UA: 6 (ref 5.0–8.0)

## 2019-03-15 MED ORDER — IBUPROFEN 800 MG PO TABS: 800.0000 mg | ORAL_TABLET | Freq: Three times a day (TID) | ORAL | 1 refills | Status: DC | PRN

## 2019-03-15 MED ORDER — FOLIC ACID 1 MG PO TABS: 1.0000 mg | ORAL_TABLET | Freq: Every day | ORAL | 3 refills | Status: DC

## 2019-03-15 MED ORDER — ASPIRIN EC 81 MG PO TBEC: 81.0000 mg | DELAYED_RELEASE_TABLET | Freq: Every day | ORAL | 3 refills | Status: DC

## 2019-03-15 NOTE — Progress Notes (Signed)
[facility]  SICKLE CELL ANEMIA FOLLOW UP VISIT PROVIDER: Lanae Boast, FNP    Subjective:   Carmen [name]  is a 28 y.o.  female who  has a past medical history of Headache(784.0), Sickle cell crisis (Carmen [name]), Syphilis (2015), and Thrombocytosis (Carmen [name]) (11/22/2014). presents for a follow up for Sickle Cell Anemia. Patient recently admitted due to c-section. Reports doing well on current medication and treatment regimen. Will need refills. Is strictly formula feeding infant, [name].  Medication compliance: Yes  The patient reports adequate daily hydration.     Review of Systems  Constitutional: Negative.   HENT: Negative.   Eyes: Negative.   Respiratory: Negative.   Cardiovascular: Negative.   Gastrointestinal: Negative.   Genitourinary: Negative.   Musculoskeletal: Negative.   Skin: Negative.   Neurological: Negative.   Psychiatric/Behavioral: Negative.     Objective:   Objective  BP 111/70 (BP Location: Left Arm, Patient Position: Sitting, Cuff Size: Normal)   Pulse 87   Temp 98.4 F (36.9 C) (Oral)   Resp 14   Ht 5\' 3"  (1.6 m)   Wt 158 lb (71.7 kg)   SpO2 100%   BMI 27.99 kg/m   Wt Readings from Last 3 Encounters:  03/15/19 158 lb (71.7 kg)  02/27/19 157 lb 3.2 oz (71.3 kg)  02/04/19 190 lb 3.2 oz (86.3 kg)     Physical Exam Vitals signs and nursing note reviewed.  Constitutional:      General: She is not in acute distress.    Appearance: Normal appearance.  HENT:     Head: Normocephalic and atraumatic.  Eyes:     Extraocular Movements: Extraocular movements intact.     Conjunctiva/sclera: Conjunctivae normal.     Pupils: Pupils are equal, round, and reactive to light.  Cardiovascular:     Rate and Rhythm: Normal rate and regular rhythm.     Heart sounds: No murmur.  Pulmonary:     Effort: Pulmonary effort is normal.     Breath sounds: Normal breath sounds.  Musculoskeletal: Normal range of motion.   Skin:    General: Skin is warm and dry.  Neurological:     Mental Status: She is alert and oriented to person, place, and time.  Psychiatric:        Mood and Affect: Mood normal.        Behavior: Behavior normal.        Thought Content: Thought content normal.        Judgment: Judgment normal.      Assessment/Plan:   Assessment   Encounter Diagnosis  Name Primary?  Marland Kitchen Hb-SS disease without crisis ([place]) Yes     Plan  1. Hb-SS disease without crisis ([place])  No medication changes warranted at the present time.  - Urinalysis Dipstick - folic acid (FOLVITE) 1 MG tablet; Take 1 tablet (1 mg total) by mouth daily.  Dispense: 90 tablet; Refill: 3 - aspirin EC 81 MG tablet; Take 1 tablet (81 mg total) by mouth daily.  Dispense: 90 tablet; Refill: 3 - ibuprofen (ADVIL) 800 MG tablet; Take 1 tablet (800 mg total) by mouth 3 (three) times daily with meals as needed.  Dispense: 30 tablet; Refill: 1 - CBC with Differential - Comprehensive metabolic panel - Iron, TIBC and Ferritin Panel   Return to care as scheduled and prn. Patient verbalized understanding and agreed with plan of care.   1. Sickle cell disease -  We discussed the need  for good hydration, monitoring of hydration status, avoidance of heat, cold, stress, and infection triggers. We discussed the risks and benefits of Hydrea, including bone marrow suppression, the possibility of GI upset, skin ulcers, hair thinning, and teratogenicity. The patient was reminded of the need to seek medical attention of any symptoms of bleeding, anemia, or infection. Continue folic acid 1 mg daily to prevent aplastic bone marrow crises.   2. Pulmonary evaluation - Patient denies severe recurrent wheezes, shortness of breath with exercise, or persistent cough. If these symptoms develop, pulmonary function tests with spirometry will be ordered, and if abnormal, plan on referral to Pulmonology for further evaluation.  3. Cardiac - Routine screening for  pulmonary hypertension is not recommended.  4. Eye - High risk of proliferative retinopathy. Annual eye exam with retinal exam recommended to patient.  5. Immunization status -  Yearly influenza vaccination is recommended, as well as being up to date with Meningococcal and Pneumococcal vaccines.   6. Acute and chronic painful episodes - We discussed that pt is to receive Schedule II prescriptions only from [place]. Pt is also aware that the prescription history is available to [place] online through the [facility] CSRS. Controlled substance agreement signed. We reminded ZAKIRA RESSEL that all patients receiving Schedule II narcotics must be seen for follow within one month of prescription being requested. We reviewed the terms of our pain agreement, including the need to keep medicines in a safe locked location away from children or pets, and the need to report excess sedation or constipation, measures to avoid constipation, and policies related to early refills and stolen prescriptions. According to the [place] Chronic Pain Initiative program, we have reviewed details related to analgesia, adverse effects, aberrant behaviors.  7. Iron overload from chronic transfusion.  Not applicable at this time.  If this occurs will use Exjade for management.   8. Vitamin D deficiency - Drisdol 50,000 units weekly. Patient encouraged to take as prescribed.   The above recommendations are taken from the NIH Evidence-Based Management of Sickle Cell Disease: Expert Panel Report, 20149.   [name], FNP-BC Patient [facility] [address] [place], [place] [address] [phone]  This note has been created with Dragon speech recognition software and smart phrase technology. Any transcriptional errors are unintentional.

## 2019-03-16 LAB — COMPREHENSIVE METABOLIC PANEL
ALT: 18 IU/L (ref 0–32)
AST: 25 IU/L (ref 0–40)
Albumin/Globulin Ratio: 1.1 — ABNORMAL LOW (ref 1.2–2.2)
Albumin: 4.5 g/dL (ref 3.9–5.0)
Alkaline Phosphatase: 98 IU/L (ref 39–117)
BUN/Creatinine Ratio: 17 (ref 9–23)
BUN: 12 mg/dL (ref 6–20)
Bilirubin Total: 0.9 mg/dL (ref 0.0–1.2)
CO2: 22 mmol/L (ref 20–29)
Calcium: 10.2 mg/dL (ref 8.7–10.2)
Chloride: 103 mmol/L (ref 96–106)
Creatinine, Ser: 0.72 mg/dL (ref 0.57–1.00)
GFR calc Af Amer: 134 mL/min/{1.73_m2} (ref >59)
GFR calc non Af Amer: 116 mL/min/{1.73_m2} (ref >59)
Globulin, Total: 4.2 g/dL (ref 1.5–4.5)
Glucose: 79 mg/dL (ref 65–99)
Potassium: 4.5 mmol/L (ref 3.5–5.2)
Sodium: 138 mmol/L (ref 134–144)
Total Protein: 8.7 g/dL — ABNORMAL HIGH (ref 6.0–8.5)

## 2019-03-16 LAB — CBC WITH DIFFERENTIAL/PLATELET
Basophils Absolute: 0.1 10*3/uL (ref 0.0–0.2)
Basos: 1 %
EOS (ABSOLUTE): 0.5 10*3/uL — ABNORMAL HIGH (ref 0.0–0.4)
Eos: 6 %
Hematocrit: 32.4 % — ABNORMAL LOW (ref 34.0–46.6)
Hemoglobin: 10.3 g/dL — ABNORMAL LOW (ref 11.1–15.9)
Immature Grans (Abs): 0 10*3/uL (ref 0.0–0.1)
Immature Granulocytes: 0 %
Lymphocytes Absolute: 3 10*3/uL (ref 0.7–3.1)
Lymphs: 36 %
MCH: 26.2 pg — ABNORMAL LOW (ref 26.6–33.0)
MCHC: 31.8 g/dL (ref 31.5–35.7)
MCV: 82 fL (ref 79–97)
Monocytes Absolute: 0.7 10*3/uL (ref 0.1–0.9)
Monocytes: 8 %
Neutrophils Absolute: 4.2 10*3/uL (ref 1.4–7.0)
Neutrophils: 49 %
Platelets: 435 10*3/uL (ref 150–450)
RBC: 3.93 x10E6/uL (ref 3.77–5.28)
RDW: 17.8 % — ABNORMAL HIGH (ref 11.7–15.4)
WBC: 8.5 10*3/uL (ref 3.4–10.8)

## 2019-03-16 LAB — IRON,TIBC AND FERRITIN PANEL
Ferritin: 148 ng/mL (ref 15–150)
Iron Saturation: 18 % (ref 15–55)
Iron: 54 ug/dL (ref 27–159)
Total Iron Binding Capacity: 304 ug/dL (ref 250–450)
UIBC: 250 ug/dL (ref 131–425)

## 2019-03-19 ENCOUNTER — Ambulatory Visit: Payer: Medicaid Other | Admitting: Advanced Practice Midwife

## 2019-03-19 ENCOUNTER — Telehealth: Payer: Self-pay

## 2019-03-19 DIAGNOSIS — D571 Sickle-cell disease without crisis: Secondary | ICD-10-CM

## 2019-03-19 DIAGNOSIS — Z79891 Long term (current) use of opiate analgesic: Secondary | ICD-10-CM

## 2019-03-20 MED ORDER — MORPHINE SULFATE ER 60 MG PO TBCR: 60.0000 mg | EXTENDED_RELEASE_TABLET | Freq: Two times a day (BID) | ORAL | 0 refills | Status: DC

## 2019-03-20 NOTE — Telephone Encounter (Signed)
Reviewed [place] Substance Reporting system prior to prescribing opiate medications. No inconsistencies noted.   

## 2019-03-22 ENCOUNTER — Telehealth: Payer: Medicaid Other | Admitting: Obstetrics and Gynecology

## 2019-03-22 ENCOUNTER — Other Ambulatory Visit: Payer: Self-pay

## 2019-03-22 ENCOUNTER — Encounter: Payer: Self-pay | Admitting: General Practice

## 2019-03-22 NOTE — Progress Notes (Signed)
Patient did not answer for her virtual visit today.  Lezlie Lye, NP 03/22/2019 2:27 PM

## 2019-03-22 NOTE — Progress Notes (Signed)
[phone]- called patient for postpartum appt, no answer- left message stating we are trying to reach you for your appt today, please call us back as soon as possible. Will send mychart message  [phone]- called patient for postpartum appt 2nd time, no answer- left message stating we are trying to reach you for your appt. Please call us back to reschedule.

## 2019-04-03 ENCOUNTER — Telehealth: Payer: Self-pay

## 2019-04-03 DIAGNOSIS — D571 Sickle-cell disease without crisis: Secondary | ICD-10-CM

## 2019-04-03 DIAGNOSIS — Z79891 Long term (current) use of opiate analgesic: Secondary | ICD-10-CM

## 2019-04-05 MED ORDER — OXYCODONE HCL 10 MG PO TABS: 10.0000 mg | ORAL_TABLET | Freq: Four times a day (QID) | ORAL | 0 refills | Status: DC | PRN

## 2019-04-05 NOTE — Telephone Encounter (Signed)
refilled 

## 2019-04-09 NOTE — BH Specialist Note (Addendum)
Update: Pt returned call, and visit was completed.  Pt did not arrive to [facility] video visit, and did not answer the phone; Left HIPPA-compliant message to call back Roselyn Reef from [facility] [employer] at [phone], and left MyChart message.   Integrated Behavioral Health Follow Up Visit  MRN: [id number] Name: Money Mckeithan, [place] Visit via Telemedicine (Telephone)  04/10/2019 AKYA FIORELLO [id number]   Session Start time: 10:24  Session End time: 10:48 Total time: 24 minutes  Referring Provider: Mora Bellman, MD and Aletha Halim, MD Type of Visit: Telephonic Patient location: Home [facility] Provider location: [facility] All persons participating in visit: Patient Orville Widmann and Daisy  Confirmed patient's address: Yes  Confirmed patient's phone number: Yes  Any changes to demographics: No   Confirmed patient's insurance: Yes  Any changes to patient's insurance: No   Discussed confidentiality: No  Discussed at previous visit   The following statements were read to the patient and/or legal guardian that are established with the [facility]: Lambert Rhonda W Provider.  "The purpose of this phone visit is to provide behavioral health care while limiting exposure to the coronavirus (COVID19).  There is a possibility of technology failure and discussed alternative modes of communication if that failure occurs."  "By engaging in this telephone visit, you consent to the provision of healthcare.  Additionally, you authorize for your insurance to be billed for the services provided during this telephone visit."   Patient and/or legal guardian consented to telephone visit: Yes   PRESENTING CONCERNS: Patient and/or family reports the following symptoms/concerns: Pt states her primary concern today is worry over missing appointments due to lack of wifi/internet access on her phone, and an increase in stress and feeling anxious after  son's  circumcision last Friday. Pt feels well-supported by family, is sleeping and eating well, with primary symptoms fatigue, worry, and adjusting to new motherhood.  Duration of problem: Postpartum; Severity of problem: mild  STRENGTHS (Protective Factors/Coping Skills): Strong social support  GOALS ADDRESSED: Patient will: 1.  Reduce symptoms of: anxiety and stress  2.  Increase knowledge and/or ability of: healthy habits and stress reduction  3.  Demonstrate ability to: Increase healthy adjustment to current life circumstances and Increase adequate support systems for patient/family  INTERVENTIONS: Interventions utilized:  Functional Assessment of ADLs, Psychoeducation and/or Health Education and Link to [employer] Standardized Assessments completed: GAD-7 and PHQ 9  ASSESSMENT: Patient currently experiencing Adjustment disorder with anxiety.   Patient may benefit from psychoeducation and brief therapeutic interventions regarding coping with symptoms of anxiety  .  PLAN: 1. Follow up with behavioral health clinician on : As needed, if symptoms increase 2. Behavioral recommendations:  -Continue with plans to begin therapy with [facility] -Continue taking prenatal vitamin daily until postpartum visit -Continue eating and sleeping well; spending time with family daily -Consider joining an online new mom support group, in a location that has wifi availability, through either [email or web address] or [email or web address].net, for additional support 3. Referral(s): [facility] (In Clinic) and [employer] Resources:  New mom support  Caroleen Hamman [facility]

## 2019-04-10 ENCOUNTER — Telehealth: Payer: Self-pay | Admitting: Family Medicine

## 2019-04-10 ENCOUNTER — Other Ambulatory Visit: Payer: Self-pay

## 2019-04-10 ENCOUNTER — Ambulatory Visit (INDEPENDENT_AMBULATORY_CARE_PROVIDER_SITE_OTHER): Payer: Medicaid Other | Admitting: Clinical

## 2019-04-10 DIAGNOSIS — F4322 Adjustment disorder with anxiety: Secondary | ICD-10-CM | POA: Diagnosis not present

## 2019-04-10 NOTE — Telephone Encounter (Signed)
Attempted to call patient to get her rescheduled for her postpartum visit. Left a message for her to call us to get scheduled.

## 2019-04-12 ENCOUNTER — Ambulatory Visit (INDEPENDENT_AMBULATORY_CARE_PROVIDER_SITE_OTHER): Payer: Medicaid Other | Admitting: Family Medicine

## 2019-04-12 ENCOUNTER — Other Ambulatory Visit: Payer: Self-pay

## 2019-04-12 ENCOUNTER — Encounter: Payer: Self-pay | Admitting: Family Medicine

## 2019-04-12 DIAGNOSIS — D571 Sickle-cell disease without crisis: Secondary | ICD-10-CM

## 2019-04-12 NOTE — Progress Notes (Signed)
  [name]  Virtual Visit via Telephone Note  I connected with [name] on 04/12/19 at 10:40 AM EDT by telephone and verified that I am speaking with the correct person using two identifiers.   I discussed the limitations, risks, security and privacy concerns of performing an evaluation and management service by telephone and the availability of in person appointments. I also discussed with the patient that there may be a patient responsible charge related to this service. The patient expressed understanding and agreed to proceed.   History of Present Illness: Carmen [name]  has a past medical history of Headache(784.0), Sickle cell crisis ([place]), Syphilis (2015), and Thrombocytosis ([place]) (11/22/2014). Patient states that she is doing well and adjusting to being a new mother. She reports compliance with medications, but states that her morphine is out. It is due 04/19/2019. She reports that her home pain medication regimen is working well for her at the present time. Would like to continue with current medications.    Observations/Objective: Patient with regular voice tone, rate and rhythm. Speaking calmly and is in no apparent distress.    Assessment and Plan: 1. Hb-SS disease without crisis ([place]) No medication changes warranted at the present time.   Continue with hydration and rest PRN.     Follow Up Instructions:  We discussed hand washing, using hand sanitizer when soap and water are not available, only going out when absolutely necessary, and social distancing. Explained to patient that she is immunocompromised and will need to take precautions during this time.   I discussed the assessment and treatment plan with the patient. The patient was provided an opportunity to ask questions and all were answered. The patient agreed with the plan and demonstrated an understanding of the instructions.   The patient was advised to call  back or seek an in-person evaluation if the symptoms worsen or if the condition fails to improve as anticipated.  I provided 8 minutes of non-face-to-face time during this encounter.  [name], FNP-BC [facility] [address] [name], [place] [address] [phone]

## 2019-04-16 ENCOUNTER — Telehealth: Payer: Self-pay

## 2019-04-16 DIAGNOSIS — Z79891 Long term (current) use of opiate analgesic: Secondary | ICD-10-CM

## 2019-04-16 DIAGNOSIS — D571 Sickle-cell disease without crisis: Secondary | ICD-10-CM

## 2019-04-16 MED ORDER — MORPHINE SULFATE ER 60 MG PO TBCR: 60.0000 mg | EXTENDED_RELEASE_TABLET | Freq: Two times a day (BID) | ORAL | 0 refills | Status: DC

## 2019-04-16 NOTE — Telephone Encounter (Signed)
Refilled

## 2019-04-17 ENCOUNTER — Other Ambulatory Visit: Payer: Self-pay

## 2019-04-17 ENCOUNTER — Emergency Department (HOSPITAL_COMMUNITY)
Admission: EM | Admit: 2019-04-17 | Discharge: 2019-04-17 | Disposition: A | Discharge status: Home / Self Care | Payer: Medicaid Other | Attending: Emergency Medicine | Admitting: Emergency Medicine

## 2019-04-17 DIAGNOSIS — Z7982 Long term (current) use of aspirin: Secondary | ICD-10-CM | POA: Insufficient documentation

## 2019-04-17 DIAGNOSIS — M549 Dorsalgia, unspecified: Secondary | ICD-10-CM | POA: Diagnosis present

## 2019-04-17 DIAGNOSIS — Z79899 Other long term (current) drug therapy: Secondary | ICD-10-CM | POA: Diagnosis not present

## 2019-04-17 DIAGNOSIS — D5700 Hb-SS disease with crisis, unspecified: Secondary | ICD-10-CM

## 2019-04-17 LAB — BASIC METABOLIC PANEL
Anion gap: 9 (ref 5–15)
BUN: 12 mg/dL (ref 6–20)
CO2: 22 mmol/L (ref 22–32)
Calcium: 9.7 mg/dL (ref 8.9–10.3)
Chloride: 108 mmol/L (ref 98–111)
Creatinine, Ser: 0.78 mg/dL (ref 0.44–1.00)
GFR calc Af Amer: >60 mL/min (ref >60)
GFR calc non Af Amer: >60 mL/min (ref >60)
Glucose, Bld: 86 mg/dL (ref 70–99)
Potassium: 4.1 mmol/L (ref 3.5–5.1)
Sodium: 139 mmol/L (ref 135–145)

## 2019-04-17 LAB — CBC WITH DIFFERENTIAL/PLATELET
Abs Immature Granulocytes: 0.03 10*3/uL (ref 0.00–0.07)
Basophils Absolute: 0.1 10*3/uL (ref 0.0–0.1)
Basophils Relative: 1 %
Eosinophils Absolute: 0.3 10*3/uL (ref 0.0–0.5)
Eosinophils Relative: 3 %
HCT: 27.2 % — ABNORMAL LOW (ref 36.0–46.0)
Hemoglobin: 9.4 g/dL — ABNORMAL LOW (ref 12.0–15.0)
Immature Granulocytes: 0 %
Lymphocytes Relative: 35 %
Lymphs Abs: 3 10*3/uL (ref 0.7–4.0)
MCH: 27.1 pg (ref 26.0–34.0)
MCHC: 34.6 g/dL (ref 30.0–36.0)
MCV: 78.4 fL — ABNORMAL LOW (ref 80.0–100.0)
Monocytes Absolute: 0.9 10*3/uL (ref 0.1–1.0)
Monocytes Relative: 11 %
Neutro Abs: 4.2 10*3/uL (ref 1.7–7.7)
Neutrophils Relative %: 50 %
Platelets: 530 10*3/uL — ABNORMAL HIGH (ref 150–400)
RBC: 3.47 MIL/uL — ABNORMAL LOW (ref 3.87–5.11)
RDW: 17.5 % — ABNORMAL HIGH (ref 11.5–15.5)
WBC: 8.5 10*3/uL (ref 4.0–10.5)
nRBC: 0.2 % (ref 0.0–0.2)

## 2019-04-17 MED ORDER — HYDROMORPHONE HCL 1 MG/ML IJ SOLN
2.0000 mg | INTRAMUSCULAR | Status: AC
  Filled 2019-04-17: qty 2

## 2019-04-17 MED ORDER — DEXTROSE-NACL 5-0.45 % IV SOLN: INTRAVENOUS | Status: DC

## 2019-04-17 MED ORDER — HYDROMORPHONE HCL 1 MG/ML IJ SOLN
2.0000 mg | INTRAMUSCULAR | Status: AC
  Administered 2019-04-17: 14:00:00 2 mg via SUBCUTANEOUS

## 2019-04-17 MED ORDER — HYDROMORPHONE HCL 1 MG/ML IJ SOLN
2.0000 mg | INTRAMUSCULAR | Status: DC
  Administered 2019-04-17: 2 mg via INTRAVENOUS
  Filled 2019-04-17: qty 2

## 2019-04-17 MED ORDER — HYDROMORPHONE HCL 1 MG/ML IJ SOLN
2.0000 mg | INTRAMUSCULAR | Status: AC
  Administered 2019-04-17: 2 mg via INTRAVENOUS
  Filled 2019-04-17: qty 2

## 2019-04-17 MED ORDER — HYDROMORPHONE HCL 1 MG/ML IJ SOLN: 2.0000 mg | INTRAMUSCULAR | Status: AC

## 2019-04-17 MED ORDER — HYDROMORPHONE HCL 1 MG/ML IJ SOLN: 2.0000 mg | INTRAMUSCULAR | Status: DC

## 2019-04-17 NOTE — Discharge Instructions (Signed)
Please follow-up with your sickle cell doctor as soon as possible for continued evaluation.  Return to the ED immediately for new or worsening symptoms or concerns, such as chest pain, shortness of breath, new or worsening pain, fevers or any concerns at all.

## 2019-04-17 NOTE — ED Triage Notes (Signed)
Pt reports back pain that is generalized  all over her back and denies any known injury- pt does report a hx of sickle cell, denies any sob. Ambulatory at triage no weakness.

## 2019-04-17 NOTE — ED Provider Notes (Signed)
[facility] EMERGENCY DEPARTMENT Provider Note   CSN: [id number] Arrival date & time: 04/17/19  1300     History   Chief Complaint Chief Complaint  Patient presents with  . Back Pain    HPI [place] [name] is a 28 y.o. female.     HPI  28 year old female, with sickle cell, presents with diffuse back pain for 2 days.  Patient states she has a history of sickle cell pain crisis and this feels the same as prior.  She states she has taken her at home medication of morphine 60 mg and it has not improved her symptoms so she came to the ER.  She denies increasing her home narcotics.  She denies any chest pain, shortness of breath, fevers, chills, cough, nausea, vomiting, abdominal pain.  She states she would just like to get her pain under control and then call back home.  She is not breast-feeding.  Past Medical History:  Diagnosis Date  . Headache(784.0)   . Sickle cell crisis ([place])   . Syphilis 2015   Was diagnosed and received one injection of antibiotics  . Thrombocytosis ([place]) 11/22/2014     CBC Latest Ref Rng & Units 12/13/2018 12/11/2018 12/10/2018 WBC 4.0 - 10.5 K/uL 14.8(H) 13.2(H) 15.9(H) Hemoglobin 12.0 - 15.0 g/dL 8.2(L) 7.7(L) 8.1(L) Hematocrit 36.0 - 46.0 % 23.7(L) 23.2(L) 24.5(L) Platelets 150 - 400 K/uL 326 399 388      Patient Active Problem List   Diagnosis Date Noted  . S/P cesarean section 02/04/2019  . 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 Sickle cell pain crisis ([place]) 12/04/2018  . Alpha thalassemia silent carrier 12/04/2018  . 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 Pain in extremity   . Supervision of high risk pregnancy, antepartum 08/17/2018  . Chronic prescription opiate use 03/13/2018  . Chronic musculoskeletal pain 03/13/2018  . Vitamin D deficiency 01/13/2018  . Leukocytosis 08/21/2017  . 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 Hb-SS disease without crisis ([place]) 08/15/2016  . Anemia of chronic  disease   . Systolic ejection murmur 02/77/4128  . Sickle cell anemia of mother during pregnancy [facility]) 05/16/2014    Past Surgical History:  Procedure Laterality Date  . CESAREAN SECTION N/A 02/05/2019   Procedure: CESAREAN SECTION;  Surgeon: [name], MD;  Location: [facility] LD ORS;  Service: Obstetrics;  Laterality: N/A;  . CHOLECYSTECTOMY N/A 11/30/2014   Procedure: LAPAROSCOPIC CHOLECYSTECTOMY SINGLE SITE WITH INTRAOPERATIVE CHOLANGIOGRAM;  Surgeon: Michael Boston, MD;  Location: [facility] ORS;  Service: General;  Laterality: N/A;  . SPLENECTOMY       OB History    Gravida  2   Para  1   Term  1   Preterm      AB  1   Living  1     SAB      TAB  1   Ectopic      Multiple  0   Live Births  1            Home Medications    Prior to Admission medications   Medication Sig Start Date End Date Taking? Authorizing Provider  aspirin EC 81 MG tablet Take 1 tablet (81 mg total) by mouth daily. 03/15/19   Lanae Boast, FNP  ergocalciferol (VITAMIN D2) 50000 units capsule Take 1 capsule (50,000 Units total) by mouth once a week. 05/22/18   Tresa Garter, MD  folic acid (FOLVITE) 1 MG tablet  Take 1 tablet (1 mg total) by mouth daily. 03/15/19   Lanae Boast, FNP  ibuprofen (ADVIL) 800 MG tablet Take 1 tablet (800 mg total) by mouth 3 (three) times daily with meals as needed. 03/15/19   Lanae Boast, FNP  morphine (MS CONTIN) 60 MG 12 hr tablet Take 1 tablet (60 mg total) by mouth every 12 (twelve) hours for 30 days. 04/19/19 05/19/19  Lanae Boast, FNP  Nutritional Supplements (ENSURE HEALTHY MOM) LIQD Drink 1 pouch BID Patient not taking: Reported on 02/05/2019 10/18/18   Emily Filbert, MD  Oxycodone HCl 10 MG TABS Take 1 tablet (10 mg total) by mouth every 6 (six) hours as needed for up to 15 days (PAIN). 04/05/19 04/20/19  Lanae Boast, FNP  Prenatal Vit-Fe Fumarate-FA (PREPLUS) 27-1 MG TABS Take 1 tablet by mouth daily. 08/17/18   [name], MD  senna-docusate  (SENOKOT-S) 8.6-50 MG tablet Take 2 tablets by mouth 2 (two) times daily as needed for mild constipation or moderate constipation. Patient not taking: Reported on 02/27/2019 02/08/19   Osborne Oman, MD    Family History Family History  Problem Relation Age of Onset  . Hypertension Mother   . Sickle cell anemia Sister   . Kidney disease Sister        Lupus  . Arthritis Sister   . Sickle cell anemia Sister   . Sickle cell trait Sister   . Heart disease Maternal Aunt        CABG  . Heart disease Maternal Uncle        CABG  . Lupus Sister     Social History Social History   Tobacco Use  . Smoking status: Never Smoker  . Smokeless tobacco: Never Used  Substance Use Topics  . Alcohol use: No  . Drug use: No     Allergies   Patient has no known allergies.   Review of Systems Review of Systems  Constitutional: Negative for chills and fever.  HENT: Negative for rhinorrhea and sore throat.   Eyes: Negative for visual disturbance.  Respiratory: Negative for cough and shortness of breath.   Cardiovascular: Negative for chest pain and leg swelling.  Gastrointestinal: Negative for abdominal pain, diarrhea, nausea and vomiting.  Genitourinary: Negative for dysuria.  Musculoskeletal: Positive for back pain. Negative for joint swelling.  Skin: Negative for rash and wound.  All other systems reviewed and are negative.    Physical Exam Updated Vital Signs BP 133/60 (BP Location: Right Arm)   Pulse 60   Temp 98.6 F (37 C) (Oral)   Resp 16   SpO2 99%   Physical Exam Vitals signs and nursing note reviewed.  Constitutional:      Appearance: She is well-developed.  HENT:     Head: Normocephalic and atraumatic.  Eyes:     Conjunctiva/sclera: Conjunctivae normal.  Neck:     Musculoskeletal: Neck supple.  Cardiovascular:     Rate and Rhythm: Normal rate and regular rhythm.     Heart sounds: Normal heart sounds. No murmur.  Pulmonary:     Effort: Pulmonary effort is  normal. No respiratory distress.     Breath sounds: Normal breath sounds. No wheezing or rales.  Abdominal:     General: Bowel sounds are normal. There is no distension.     Palpations: Abdomen is soft.     Tenderness: There is no abdominal tenderness.  Musculoskeletal: Normal range of motion.        General: No deformity.  Cervical back: Normal.     Thoracic back: She exhibits tenderness and bony tenderness. She exhibits no swelling and no edema.     Lumbar back: She exhibits tenderness and bony tenderness. She exhibits no swelling and no edema.  Skin:    General: Skin is warm and dry.     Findings: No erythema or rash.  Neurological:     Mental Status: She is alert and oriented to person, place, and time.  Psychiatric:        Behavior: Behavior normal.      ED Treatments / Results  Labs (all labs ordered are listed, but only abnormal results are displayed) Labs Reviewed  CBC WITH DIFFERENTIAL/PLATELET  BASIC METABOLIC PANEL    EKG    Radiology No results found.  Procedures Procedures (including critical care time)  Medications Ordered in ED Medications  HYDROmorphone (DILAUDID) injection 2 mg (has no administration in time range)    Or  HYDROmorphone (DILAUDID) injection 2 mg (has no administration in time range)  HYDROmorphone (DILAUDID) injection 2 mg (has no administration in time range)    Or  HYDROmorphone (DILAUDID) injection 2 mg (has no administration in time range)  HYDROmorphone (DILAUDID) injection 2 mg (has no administration in time range)    Or  HYDROmorphone (DILAUDID) injection 2 mg (has no administration in time range)  HYDROmorphone (DILAUDID) injection 2 mg (has no administration in time range)    Or  HYDROmorphone (DILAUDID) injection 2 mg (has no administration in time range)  dextrose 5 %-0.45 % sodium chloride infusion (has no administration in time range)     Initial Impression / Assessment and Plan / ED Course  I have reviewed the  triage vital signs and the nursing notes.  Pertinent labs & imaging results that were available during my care of the patient were reviewed by me and considered in my medical decision making (see chart for details).        Patient presents with diffuse back pain.  She states symptoms today are the same as previous episodes of sickle cell pain.  She denies any chest pain or shortness of breath.  Vital signs stable.  Her hemoglobin is 9.4, reviewed EMR and her baseline looks like it is around 8.  This is within her normal.  Her pain has significantly improved.  She feels ready to go home.  Encourage close follow-up with sickle cell clinic.  She was given strict return precautions.  She is ready and stable for discharge.   At this time there does not appear to be any evidence of an acute emergency medical condition and the patient appears stable for discharge with appropriate outpatient follow up.Diagnosis was discussed with patient who verbalizes understanding and is agreeable to discharge. Pt case discussed with Dr. Eulis Foster who agrees with my plan.   Final Clinical Impressions(s) / ED Diagnoses   Final diagnoses:  None    ED Discharge Orders    None       Rachel Moulds 04/17/19 2006    Daleen Bo, MD 04/18/19 [phone]

## 2019-04-17 NOTE — ED Notes (Signed)
Patient verbalizes understanding of discharge instructions. Opportunity for questioning and answers were provided. Armband removed by staff, pt discharged from ED.  

## 2019-04-17 NOTE — ED Notes (Signed)
Missed IV attempt x2  

## 2019-04-18 ENCOUNTER — Telehealth: Payer: Self-pay

## 2019-04-18 NOTE — Telephone Encounter (Signed)
Called, no answer. Left a message.

## 2019-04-24 ENCOUNTER — Telehealth: Payer: Self-pay

## 2019-04-24 DIAGNOSIS — Z79891 Long term (current) use of opiate analgesic: Secondary | ICD-10-CM

## 2019-04-24 DIAGNOSIS — D571 Sickle-cell disease without crisis: Secondary | ICD-10-CM

## 2019-04-25 MED ORDER — OXYCODONE HCL 10 MG PO TABS: 10.0000 mg | ORAL_TABLET | Freq: Four times a day (QID) | ORAL | 0 refills | Status: DC | PRN

## 2019-04-25 NOTE — Telephone Encounter (Signed)
refilled 

## 2019-05-07 ENCOUNTER — Telehealth: Payer: Self-pay

## 2019-05-07 NOTE — Telephone Encounter (Signed)
Patient called in stating she needs to schedule an appointment she missed. Patient was scheduled for her postpartum on 7/7 @ 3:55. Patient instructed that the visit is mychart. Patient verbalized she has the app downloaded and knows how to access the appointment.

## 2019-05-08 ENCOUNTER — Telehealth: Payer: Medicaid Other

## 2019-05-08 ENCOUNTER — Other Ambulatory Visit: Payer: Self-pay

## 2019-05-08 DIAGNOSIS — Z5329 Procedure and treatment not carried out because of patient's decision for other reasons: Secondary | ICD-10-CM

## 2019-05-08 DIAGNOSIS — Z91199 Patient's noncompliance with other medical treatment and regimen due to unspecified reason: Secondary | ICD-10-CM

## 2019-05-08 NOTE — Progress Notes (Signed)
Unable to reach patient for virtual visit. Will have patient reschedule.   Wende Mott, CNM 05/08/19 4:36 PM

## 2019-05-09 ENCOUNTER — Encounter: Payer: Self-pay | Admitting: Nurse Practitioner

## 2019-05-09 ENCOUNTER — Other Ambulatory Visit: Payer: Self-pay

## 2019-05-09 ENCOUNTER — Telehealth: Payer: Self-pay | Admitting: Obstetrics & Gynecology

## 2019-05-09 ENCOUNTER — Telehealth (INDEPENDENT_AMBULATORY_CARE_PROVIDER_SITE_OTHER): Payer: Medicaid Other | Admitting: Nurse Practitioner

## 2019-05-09 NOTE — Telephone Encounter (Signed)
Called the patient to inform of upcoming appointment. Received a message stating your call can not be completed at this time. Please hang up and try your call again later.

## 2019-05-09 NOTE — Progress Notes (Signed)
Subjective:     Carmen [name] is a 28 y.o. female who presents for a postpartum visit. She is 13 weeks postpartum following a c/section. I have fully reviewed the prenatal and intrapartum course. The delivery was at 75 gestational weeks. Outcome: primary cesarean section, low transverse incision. Anesthesia: epidural. Postpartum course has been unremarkable. Baby's course has been good.  Baby is feeding by bottle [name]. Bleeding moderate lochia. Bowel function is normal. Bladder function is normal. Patient is sexually active. Contraception method is Depo-Provera injections. Postpartum depression screening: negative.  The following portions of the patient's history were reviewed and updated as appropriate: allergies, current medications, past family history, past medical history, past social history, past surgical history and problem list.  Review of Systems Pertinent items noted in HPI and remainder of comprehensive ROS otherwise negative.   Objective:    LMP 04/27/2019   Breastfeeding No                                         GENERAL:  female in no acute distress.  PSYCH: Normal mood and affect Exam appropriate for telephone visit  Assessment:    limited postpartum exam - video visit due to [place]. Pap smear not done at today's visit.   Plan:    1. Contraception: Depo-Provera injections but wants tubal ligation. 2. Wants tubal ligation - message sent to admin staff - J. Battle about scheduling for a tubal. 3. Follow up in: 1 year or as needed. I connected with  Carmen [name] on 05/09/19 at  2:35 PM EDT by telephone and verified that I am speaking with the correct person using two identifiers. Client does not have capability to connect with video visit.   I discussed the limitations, risks, security and privacy concerns of performing an evaluation and management service by telephone and virtually and the availability of in person appointments. I also discussed with the  patient that there may be a patient responsible charge related to this service. The patient expressed understanding and agreed to proceed.  Linda,RN 05/09/2019  2:28 PM   Earlie Server, RN, MSN, NP-BC Nurse Practitioner, [facility] for [employer], [employer] [facility] 05/09/2019 2:53 PM

## 2019-05-10 ENCOUNTER — Ambulatory Visit: Payer: Medicaid Other

## 2019-05-14 ENCOUNTER — Telehealth: Payer: Self-pay

## 2019-05-14 DIAGNOSIS — Z79891 Long term (current) use of opiate analgesic: Secondary | ICD-10-CM

## 2019-05-14 DIAGNOSIS — D571 Sickle-cell disease without crisis: Secondary | ICD-10-CM

## 2019-05-14 MED ORDER — MORPHINE SULFATE ER 60 MG PO TBCR: 60.0000 mg | EXTENDED_RELEASE_TABLET | Freq: Two times a day (BID) | ORAL | 0 refills | Status: DC

## 2019-05-14 MED ORDER — OXYCODONE HCL 10 MG PO TABS: 10.0000 mg | ORAL_TABLET | Freq: Four times a day (QID) | ORAL | 0 refills | Status: DC | PRN

## 2019-05-14 NOTE — Telephone Encounter (Signed)
Refilled

## 2019-05-16 ENCOUNTER — Emergency Department (HOSPITAL_COMMUNITY)
Admission: EM | Admit: 2019-05-16 | Discharge: 2019-05-16 | Disposition: A | Discharge status: Home / Self Care | Payer: Medicaid Other | Attending: Emergency Medicine | Admitting: Emergency Medicine

## 2019-05-16 ENCOUNTER — Other Ambulatory Visit: Payer: Self-pay

## 2019-05-16 ENCOUNTER — Telehealth: Payer: Self-pay | Admitting: Obstetrics & Gynecology

## 2019-05-16 ENCOUNTER — Encounter (HOSPITAL_COMMUNITY): Payer: Self-pay | Admitting: Family Medicine

## 2019-05-16 DIAGNOSIS — F17210 Nicotine dependence, cigarettes, uncomplicated: Secondary | ICD-10-CM | POA: Diagnosis not present

## 2019-05-16 DIAGNOSIS — D5700 Hb-SS disease with crisis, unspecified: Secondary | ICD-10-CM | POA: Diagnosis present

## 2019-05-16 LAB — CBC WITH DIFFERENTIAL/PLATELET
Abs Immature Granulocytes: 0.02 10*3/uL (ref 0.00–0.07)
Basophils Absolute: 0.1 10*3/uL (ref 0.0–0.1)
Basophils Relative: 1 %
Eosinophils Absolute: 0.3 10*3/uL (ref 0.0–0.5)
Eosinophils Relative: 4 %
HCT: 27.4 % — ABNORMAL LOW (ref 36.0–46.0)
Hemoglobin: 9.2 g/dL — ABNORMAL LOW (ref 12.0–15.0)
Immature Granulocytes: 0 %
Lymphocytes Relative: 40 %
Lymphs Abs: 3.2 10*3/uL (ref 0.7–4.0)
MCH: 28.2 pg (ref 26.0–34.0)
MCHC: 33.6 g/dL (ref 30.0–36.0)
MCV: 84 fL (ref 80.0–100.0)
Monocytes Absolute: 0.9 10*3/uL (ref 0.1–1.0)
Monocytes Relative: 11 %
Neutro Abs: 3.5 10*3/uL (ref 1.7–7.7)
Neutrophils Relative %: 44 %
Platelets: 511 10*3/uL — ABNORMAL HIGH (ref 150–400)
RBC: 3.26 MIL/uL — ABNORMAL LOW (ref 3.87–5.11)
RDW: 16.2 % — ABNORMAL HIGH (ref 11.5–15.5)
WBC: 7.8 10*3/uL (ref 4.0–10.5)
nRBC: 0.4 % — ABNORMAL HIGH (ref 0.0–0.2)

## 2019-05-16 LAB — COMPREHENSIVE METABOLIC PANEL
ALT: 26 U/L (ref 0–44)
AST: 30 U/L (ref 15–41)
Albumin: 4.5 g/dL (ref 3.5–5.0)
Alkaline Phosphatase: 60 U/L (ref 38–126)
Anion gap: 7 (ref 5–15)
BUN: 17 mg/dL (ref 6–20)
CO2: 24 mmol/L (ref 22–32)
Calcium: 9.4 mg/dL (ref 8.9–10.3)
Chloride: 110 mmol/L (ref 98–111)
Creatinine, Ser: 0.66 mg/dL (ref 0.44–1.00)
GFR calc Af Amer: >60 mL/min (ref >60)
GFR calc non Af Amer: >60 mL/min (ref >60)
Glucose, Bld: 102 mg/dL — ABNORMAL HIGH (ref 70–99)
Potassium: 4 mmol/L (ref 3.5–5.1)
Sodium: 141 mmol/L (ref 135–145)
Total Bilirubin: 1 mg/dL (ref 0.3–1.2)
Total Protein: 8.5 g/dL — ABNORMAL HIGH (ref 6.5–8.1)

## 2019-05-16 LAB — RETICULOCYTES
Immature Retic Fract: 30.8 % — ABNORMAL HIGH (ref 2.3–15.9)
RBC.: 3.26 MIL/uL — ABNORMAL LOW (ref 3.87–5.11)
Retic Count, Absolute: 137.2 10*3/uL (ref 19.0–186.0)
Retic Ct Pct: 4.2 % — ABNORMAL HIGH (ref 0.4–3.1)

## 2019-05-16 LAB — I-STAT BETA HCG BLOOD, ED (MC, WL, AP ONLY): I-stat hCG, quantitative: <5 m[IU]/mL (ref <5)

## 2019-05-16 MED ORDER — HYDROMORPHONE HCL 1 MG/ML IJ SOLN: 1.0000 mg | INTRAMUSCULAR | Status: AC

## 2019-05-16 MED ORDER — HYDROMORPHONE HCL 1 MG/ML IJ SOLN: 1.0000 mg | INTRAMUSCULAR | Status: DC

## 2019-05-16 MED ORDER — DEXTROSE-NACL 5-0.45 % IV SOLN
INTRAVENOUS | Status: DC
  Administered 2019-05-16: 04:00:00 via INTRAVENOUS

## 2019-05-16 MED ORDER — HYDROMORPHONE HCL 2 MG/ML IJ SOLN
2.0000 mg | Freq: Once | INTRAMUSCULAR | Status: AC
  Administered 2019-05-16: 2 mg via INTRAVENOUS
  Filled 2019-05-16: qty 1

## 2019-05-16 MED ORDER — KETOROLAC TROMETHAMINE 30 MG/ML IJ SOLN
30.0000 mg | INTRAMUSCULAR | Status: AC
  Administered 2019-05-16: 30 mg via INTRAVENOUS
  Filled 2019-05-16 (×2): qty 1

## 2019-05-16 MED ORDER — HYDROMORPHONE HCL 1 MG/ML IJ SOLN
1.0000 mg | INTRAMUSCULAR | Status: AC
  Administered 2019-05-16: 1 mg via INTRAVENOUS
  Filled 2019-05-16: qty 1

## 2019-05-16 MED ORDER — HYDROMORPHONE HCL 1 MG/ML IJ SOLN
0.5000 mg | INTRAMUSCULAR | Status: DC
  Filled 2019-05-16: qty 1

## 2019-05-16 MED ORDER — OXYCODONE HCL 5 MG PO TABS
10.0000 mg | ORAL_TABLET | Freq: Once | ORAL | Status: AC
  Administered 2019-05-16: 10 mg via ORAL
  Filled 2019-05-16: qty 2

## 2019-05-16 MED ORDER — HYDROMORPHONE HCL 1 MG/ML IJ SOLN: 0.5000 mg | INTRAMUSCULAR | Status: DC

## 2019-05-16 MED ORDER — ONDANSETRON HCL 4 MG/2ML IJ SOLN
4.0000 mg | INTRAMUSCULAR | Status: DC | PRN
  Administered 2019-05-16: 4 mg via INTRAVENOUS
  Filled 2019-05-16: qty 2

## 2019-05-16 MED ORDER — SODIUM CHLORIDE 0.9% FLUSH: 3.0000 mL | Freq: Once | INTRAVENOUS | Status: DC

## 2019-05-16 NOTE — Telephone Encounter (Signed)
Attempted to call patient with her appointment on 7/20 @ 8:55, Mychart visit. No answer, left voicemail with this appointment information. Instructed patient to to give the office a call back to confirm appointment. Instructed this appointment must be kept to have her surgery done.

## 2019-05-16 NOTE — ED Provider Notes (Signed)
[facility] DEPT Provider Note   CSN: [id number] Arrival date & time: 05/16/19  0005     History   Chief Complaint Chief Complaint  Patient presents with   Sickle Cell Pain Crisis    HPI Carmen [name] is a 28 y.o. female.      Sickle Cell Pain Crisis Location:  Back and lower extremity Severity:  Mild Onset quality:  Gradual Similar to previous crisis episodes: no   Timing:  Constant Chronicity:  Recurrent History of pulmonary emboli: no   Relieved by:  None tried Worsened by:  Nothing Ineffective treatments:  None tried   Past Medical History:  Diagnosis Date   Headache(784.0)    Sickle cell crisis ([place])    Syphilis 2015   Was diagnosed and received one injection of antibiotics   Thrombocytosis ([place]) 11/22/2014     CBC Latest Ref Rng & Units 12/13/2018 12/11/2018 12/10/2018 WBC 4.0 - 10.5 K/uL 14.8(H) 13.2(H) 15.9(H) Hemoglobin 12.0 - 15.0 g/dL 8.2(L) 7.7(L) 8.1(L) Hematocrit 36.0 - 46.0 % 23.7(L) 23.2(L) 24.5(L) Platelets 150 - 400 K/uL 326 399 388      Patient Active Problem List   Diagnosis Date Noted   Sickle cell disease ([place]) 12/04/2018   Alpha thalassemia silent carrier 12/04/2018   Chronic prescription opiate use 03/13/2018   Chronic musculoskeletal pain 03/13/2018   Vitamin D deficiency 01/13/2018   Leukocytosis 08/21/2017   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[place]) 04/05/2017   Hb-SS disease without crisis ([place]) 08/15/2016   Anemia of chronic disease    Systolic ejection murmur 46/96/2952   Sickle cell anemia of mother during pregnancy ([place]) 05/16/2014    Past Surgical History:  Procedure Laterality Date   CESAREAN SECTION N/A 02/05/2019   Procedure: CESAREAN SECTION;  Surgeon: [name], MD;  Location: [facility];  Service: Obstetrics;  Laterality: N/A;   CHOLECYSTECTOMY N/A 11/30/2014   Procedure: LAPAROSCOPIC CHOLECYSTECTOMY SINGLE SITE WITH INTRAOPERATIVE CHOLANGIOGRAM;  Surgeon: Michael Boston, MD;  Location: [facility] ORS;  Service: General;  Laterality: N/A;   SPLENECTOMY       OB History    Gravida  2   Para  1   Term  1   Preterm      AB  1   Living  1     SAB      TAB  1   Ectopic      Multiple  0   Live Births  1            Home Medications    Prior to Admission medications   Medication Sig Start Date End Date Taking? Authorizing Provider  aspirin EC 81 MG tablet Take 1 tablet (81 mg total) by mouth daily. 03/15/19  Yes Lanae Boast, FNP  ergocalciferol (VITAMIN D2) 50000 units capsule Take 1 capsule (50,000 Units total) by mouth once a week. 05/22/18  Yes Tresa Garter, MD  folic acid (FOLVITE) 1 MG tablet Take 1 tablet (1 mg total) by mouth daily. 03/15/19  Yes Lanae Boast, FNP  ibuprofen (ADVIL) 800 MG tablet Take 1 tablet (800 mg total) by mouth 3 (three) times daily with meals as needed. Patient taking differently: Take 800 mg by mouth 3 (three) times daily with meals as needed for moderate pain.  03/15/19  Yes Lanae Boast, FNP  morphine (MS CONTIN) 60 MG 12 hr tablet Take 1 tablet (60 mg total) by mouth every 12 (twelve) hours. 05/18/19 06/17/19 Yes Lanae Boast, [place]  Oxycodone HCl 10 MG TABS Take 1 tablet (10 mg total) by mouth every 6 (six) hours as needed for up to 15 days (PAIN). 05/14/19 05/29/19 Yes Lanae Boast, FNP  Prenatal Vit-Fe Fumarate-FA (PREPLUS) 27-1 MG TABS Take 1 tablet by mouth daily. 08/17/18  Yes [name], MD    Family History Family History  Problem Relation Age of Onset   Hypertension Mother    Sickle cell anemia Sister    Kidney disease Sister        Lupus   Arthritis Sister    Sickle cell anemia Sister    Sickle cell trait Sister    Heart disease Maternal Aunt        CABG   Heart disease Maternal Uncle        CABG   Lupus Sister     Social History Social History   Tobacco Use   Smoking status: Current Some Day Smoker    Types: Cigarettes   Smokeless tobacco: Never Used    Substance Use Topics   Alcohol use: No   Drug use: No     Allergies   Patient has no known allergies.   Review of Systems Review of Systems  All other systems reviewed and are negative.    Physical Exam Updated Vital Signs BP (!) 106/54 (BP Location: Right Arm)    Pulse 91    Temp 98.6 F (37 C) (Oral)    Resp 18    Ht 5\' 3"  (1.6 m)    Wt 72.6 kg    LMP 04/27/2019    SpO2 100%    BMI 28.34 kg/m   Physical Exam Vitals signs and nursing note reviewed.  Constitutional:      Appearance: She is well-developed.  HENT:     Head: Normocephalic and atraumatic.     Mouth/Throat:     Mouth: Mucous membranes are dry.     Pharynx: Oropharynx is clear.  Eyes:     Conjunctiva/sclera: Conjunctivae normal.  Neck:     Musculoskeletal: Normal range of motion.  Cardiovascular:     Rate and Rhythm: Normal rate and regular rhythm.  Pulmonary:     Effort: No respiratory distress.     Breath sounds: No stridor.  Abdominal:     General: Bowel sounds are normal. There is no distension.     Palpations: Abdomen is soft.  Musculoskeletal: Normal range of motion.        General: No swelling or tenderness.  Skin:    General: Skin is warm and dry.     Coloration: Skin is not jaundiced or pale.  Neurological:     General: No focal deficit present.     Mental Status: She is alert.      ED Treatments / Results  Labs (all labs ordered are listed, but only abnormal results are displayed) Labs Reviewed  COMPREHENSIVE METABOLIC PANEL - Abnormal; Notable for the following components:      Result Value   Glucose, Bld 102 (*)    Total Protein 8.5 (*)    All other components within normal limits  CBC WITH DIFFERENTIAL/PLATELET - Abnormal; Notable for the following components:   RBC 3.26 (*)    Hemoglobin 9.2 (*)    HCT 27.4 (*)    RDW 16.2 (*)    Platelets 511 (*)    nRBC 0.4 (*)    All other components within normal limits  RETICULOCYTES - Abnormal; Notable for the following  components:   Retic  Ct Pct 4.2 (*)    RBC. 3.26 (*)    Immature Retic Fract 30.8 (*)    All other components within normal limits  I-STAT BETA HCG BLOOD, ED ([facility], [facility], [facility] ONLY)    EKG None  Radiology No results found.  Procedures Procedures (including critical care time)  Medications Ordered in ED Medications  sodium chloride flush (NS) 0.9 % injection 3 mL (0 mLs Intravenous Hold 05/16/19 0349)  dextrose 5 %-0.45 % sodium chloride infusion ( Intravenous New Bag/Given 05/16/19 0356)  ondansetron (ZOFRAN) injection 4 mg (4 mg Intravenous Given 05/16/19 0357)  oxyCODONE (Oxy IR/ROXICODONE) immediate release tablet 10 mg (has no administration in time range)  ketorolac (TORADOL) 30 MG/ML injection 30 mg (30 mg Intravenous Given 05/16/19 0357)  HYDROmorphone (DILAUDID) injection 1 mg (1 mg Intravenous Given 05/16/19 0348)    Or  HYDROmorphone (DILAUDID) injection 1 mg ( Subcutaneous See Alternative 05/16/19 0348)  HYDROmorphone (DILAUDID) injection 1 mg (1 mg Intravenous Given 05/16/19 0444)    Or  HYDROmorphone (DILAUDID) injection 1 mg ( Subcutaneous See Alternative 05/16/19 0444)  HYDROmorphone (DILAUDID) injection 2 mg (2 mg Intravenous Given 05/16/19 0535)  HYDROmorphone (DILAUDID) injection 2 mg (2 mg Intravenous Given 05/16/19 7322)     Initial Impression / Assessment and Plan / ED Course  I have reviewed the triage vital signs and the nursing notes.  Pertinent labs & imaging results that were available during my care of the patient were reviewed by me and considered in my medical decision making (see chart for details).   Acute exacerbation of her sickle cell disease pain.  Pain controlled and home medications ordered prior to discharge.  PCP follow-up.  Final Clinical Impressions(s) / ED Diagnoses   Final diagnoses:  Sickle cell pain crisis [facility])    ED Discharge Orders    None       Shea Swalley, Corene Cornea, MD 05/16/19 [phone]

## 2019-05-16 NOTE — ED Triage Notes (Signed)
Patient is complaining of sickle cell in her back and lower extremity. Pain started yesterday.

## 2019-05-16 NOTE — ED Notes (Signed)
Unable to get IV access. IV team consult placed. Pt was explained and offered subcutaneous Dilaudid until IV team can arrive. Pt declined and asked to wait for IV dilaudid at this time.

## 2019-05-17 ENCOUNTER — Encounter (HOSPITAL_COMMUNITY): Payer: Self-pay | Admitting: Emergency Medicine

## 2019-05-17 ENCOUNTER — Other Ambulatory Visit: Payer: Self-pay

## 2019-05-17 ENCOUNTER — Emergency Department (HOSPITAL_COMMUNITY)
Admission: EM | Admit: 2019-05-17 | Discharge: 2019-05-17 | Disposition: A | Discharge status: Home / Self Care | Payer: Medicaid Other | Attending: Emergency Medicine | Admitting: Emergency Medicine

## 2019-05-17 DIAGNOSIS — Z20828 Contact with and (suspected) exposure to other viral communicable diseases: Secondary | ICD-10-CM | POA: Diagnosis not present

## 2019-05-17 DIAGNOSIS — Z79899 Other long term (current) drug therapy: Secondary | ICD-10-CM | POA: Diagnosis present

## 2019-05-17 DIAGNOSIS — F17210 Nicotine dependence, cigarettes, uncomplicated: Secondary | ICD-10-CM | POA: Insufficient documentation

## 2019-05-17 DIAGNOSIS — Z7982 Long term (current) use of aspirin: Secondary | ICD-10-CM | POA: Diagnosis not present

## 2019-05-17 DIAGNOSIS — D5700 Hb-SS disease with crisis, unspecified: Secondary | ICD-10-CM | POA: Diagnosis not present

## 2019-05-17 LAB — CBC WITH DIFFERENTIAL/PLATELET
Abs Immature Granulocytes: 0.01 10*3/uL (ref 0.00–0.07)
Basophils Absolute: 0 10*3/uL (ref 0.0–0.1)
Basophils Relative: 1 %
Eosinophils Absolute: 0.2 10*3/uL (ref 0.0–0.5)
Eosinophils Relative: 4 %
HCT: 26.4 % — ABNORMAL LOW (ref 36.0–46.0)
Hemoglobin: 9.2 g/dL — ABNORMAL LOW (ref 12.0–15.0)
Immature Granulocytes: 0 %
Lymphocytes Relative: 42 %
Lymphs Abs: 2.2 10*3/uL (ref 0.7–4.0)
MCH: 28.5 pg (ref 26.0–34.0)
MCHC: 34.8 g/dL (ref 30.0–36.0)
MCV: 81.7 fL (ref 80.0–100.0)
Monocytes Absolute: 0.7 10*3/uL (ref 0.1–1.0)
Monocytes Relative: 14 %
Neutro Abs: 2.1 10*3/uL (ref 1.7–7.7)
Neutrophils Relative %: 39 %
Platelets: 535 10*3/uL — ABNORMAL HIGH (ref 150–400)
RBC: 3.23 MIL/uL — ABNORMAL LOW (ref 3.87–5.11)
RDW: 16.1 % — ABNORMAL HIGH (ref 11.5–15.5)
WBC: 5.3 10*3/uL (ref 4.0–10.5)
nRBC: 0.4 % — ABNORMAL HIGH (ref 0.0–0.2)

## 2019-05-17 LAB — COMPREHENSIVE METABOLIC PANEL
ALT: 26 U/L (ref 0–44)
AST: 29 U/L (ref 15–41)
Albumin: 4.1 g/dL (ref 3.5–5.0)
Alkaline Phosphatase: 56 U/L (ref 38–126)
Anion gap: 7 (ref 5–15)
BUN: 7 mg/dL (ref 6–20)
CO2: 20 mmol/L — ABNORMAL LOW (ref 22–32)
Calcium: 9.5 mg/dL (ref 8.9–10.3)
Chloride: 111 mmol/L (ref 98–111)
Creatinine, Ser: 0.6 mg/dL (ref 0.44–1.00)
GFR calc Af Amer: >60 mL/min (ref >60)
GFR calc non Af Amer: >60 mL/min (ref >60)
Glucose, Bld: 91 mg/dL (ref 70–99)
Potassium: 3.7 mmol/L (ref 3.5–5.1)
Sodium: 138 mmol/L (ref 135–145)
Total Bilirubin: 1.3 mg/dL — ABNORMAL HIGH (ref 0.3–1.2)
Total Protein: 7.2 g/dL (ref 6.5–8.1)

## 2019-05-17 LAB — URINALYSIS, ROUTINE W REFLEX MICROSCOPIC
Bacteria, UA: NONE SEEN
Bilirubin Urine: NEGATIVE
Glucose, UA: NEGATIVE mg/dL
Ketones, ur: NEGATIVE mg/dL
Leukocytes,Ua: NEGATIVE
Nitrite: NEGATIVE
Protein, ur: NEGATIVE mg/dL
Specific Gravity, Urine: 1.012 (ref 1.005–1.030)
pH: 6 (ref 5.0–8.0)

## 2019-05-17 LAB — RETICULOCYTES
Immature Retic Fract: 26.2 % — ABNORMAL HIGH (ref 2.3–15.9)
RBC.: 3.23 MIL/uL — ABNORMAL LOW (ref 3.87–5.11)
Retic Count, Absolute: 113.1 10*3/uL (ref 19.0–186.0)
Retic Ct Pct: 3.5 % — ABNORMAL HIGH (ref 0.4–3.1)

## 2019-05-17 LAB — I-STAT BETA HCG BLOOD, ED (MC, WL, AP ONLY): I-stat hCG, quantitative: <5 m[IU]/mL (ref <5)

## 2019-05-17 LAB — SARS CORONAVIRUS 2 BY RT PCR (HOSPITAL ORDER, PERFORMED IN ~~LOC~~ HOSPITAL LAB): SARS Coronavirus 2: NEGATIVE

## 2019-05-17 MED ORDER — HYDROMORPHONE HCL 1 MG/ML IJ SOLN: 2.0000 mg | INTRAMUSCULAR | Status: AC

## 2019-05-17 MED ORDER — HYDROMORPHONE HCL 1 MG/ML IJ SOLN
2.0000 mg | INTRAMUSCULAR | Status: AC
  Filled 2019-05-17: qty 2

## 2019-05-17 MED ORDER — KETOROLAC TROMETHAMINE 30 MG/ML IJ SOLN
30.0000 mg | INTRAMUSCULAR | Status: AC
  Administered 2019-05-17: 30 mg via INTRAVENOUS
  Filled 2019-05-17: qty 1

## 2019-05-17 MED ORDER — SODIUM CHLORIDE 0.9 % IV BOLUS
1000.0000 mL | Freq: Once | INTRAVENOUS | Status: AC
  Administered 2019-05-17: 1000 mL via INTRAVENOUS

## 2019-05-17 MED ORDER — HYDROMORPHONE HCL 1 MG/ML IJ SOLN: 2.0000 mg | INTRAMUSCULAR | Status: DC

## 2019-05-17 MED ORDER — HYDROMORPHONE HCL 1 MG/ML IJ SOLN
2.0000 mg | INTRAMUSCULAR | Status: AC
  Administered 2019-05-17: 2 mg via INTRAVENOUS
  Filled 2019-05-17: qty 2

## 2019-05-17 MED ORDER — HYDROMORPHONE HCL 1 MG/ML IJ SOLN
2.0000 mg | INTRAMUSCULAR | Status: DC
  Administered 2019-05-17: 2 mg via INTRAVENOUS

## 2019-05-17 NOTE — Discharge Instructions (Signed)
You have requested to go home, you may always return if things worsen.  Please return for increasing pain vomiting or fever or any other severe or worsening symptoms.  I would encourage you to follow-up with your doctor within the next several days for recheck.

## 2019-05-17 NOTE — ED Provider Notes (Signed)
I personally reevaluated the patient, she states that she wants to go home, states that she needs to go to work, states that her pain is much better and she feels like her home pain medicines will be able to handle this.  The patient is aware that we had offered her admission to the hospital if her pain was not controlled but she states that she does not want to come into the hospital today.  She does agree to come back if things get worse.  Labs reviewed and are unremarkable and reassuring.   Noemi Chapel, MD 05/17/19 [phone]

## 2019-05-17 NOTE — ED Notes (Signed)
Pt verbalized understanding of d/c instructions and has no further  Questions, VSS, NAD. Pt d/c home with family driving.

## 2019-05-17 NOTE — ED Triage Notes (Signed)
Patient reports sickle cell pain crisis with  persistent generalized body aches/pain at arms/legs for 3 days unrelieved by prescription Morphine , denies fever or chills /respirations unlabored .

## 2019-05-17 NOTE — ED Provider Notes (Signed)
[facility] EMERGENCY DEPARTMENT Provider Note   CSN: [id number] Arrival date & time: 05/17/19  0418   History   Chief Complaint Chief Complaint  Patient presents with  . Sickle Cell Pain Crisis    HPI Carmen [name] is a 28 y.o. female.   The history is provided by the patient.  Sickle Cell Pain Crisis She has history of sickle cell disease and comes in with generalized body pain typical of her sickle cell crises.  She had been in the emergency department yesterday with pain that was localized to her back and legs.  She felt better following pain medication in the ED and went home and was doing well with taking her home pain medication until she was awakened at about 2 AM with pain that included her leg, back, arms.  She is not having any chest pain or dyspnea.  She denies fever or chills.  She denies any cough.  Her home pain medication was not effective for the pain that woke her up.  She is complaining of pain which she rates at 10/10.  She denies any sick contacts and specifically denies exposure to COVID-19.  Of note, she is 3 months postpartum but is not breast-feeding.  Past Medical History:  Diagnosis Date  . Headache(784.0)   . Sickle cell crisis ([place])   . Syphilis 2015   Was diagnosed and received one injection of antibiotics  . Thrombocytosis ([place]) 11/22/2014     CBC Latest Ref Rng & Units 12/13/2018 12/11/2018 12/10/2018 WBC 4.0 - 10.5 K/uL 14.8(H) 13.2(H) 15.9(H) Hemoglobin 12.0 - 15.0 g/dL 8.2(L) 7.7(L) 8.1(L) Hematocrit 36.0 - 46.0 % 23.7(L) 23.2(L) 24.5(L) Platelets 150 - 400 K/uL 326 399 388      Patient Active Problem List   Diagnosis Date Noted  . Sickle cell disease ([place]) 12/04/2018  . Alpha thalassemia silent carrier 12/04/2018  . Chronic prescription opiate use 03/13/2018  . Chronic musculoskeletal pain 03/13/2018  . Vitamin D deficiency 01/13/2018  . Leukocytosis 08/21/2017  . 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  Hb-SS disease without crisis ([place]) 08/15/2016  . Anemia of chronic disease   . Systolic ejection murmur 54/62/7035  . Sickle cell anemia of mother during pregnancy [facility]) 05/16/2014    Past Surgical History:  Procedure Laterality Date  . CESAREAN SECTION N/A 02/05/2019   Procedure: CESAREAN SECTION;  Surgeon: [name], MD;  Location: [facility] LD ORS;  Service: Obstetrics;  Laterality: N/A;  . CHOLECYSTECTOMY N/A 11/30/2014   Procedure: LAPAROSCOPIC CHOLECYSTECTOMY SINGLE SITE WITH INTRAOPERATIVE CHOLANGIOGRAM;  Surgeon: Michael Boston, MD;  Location: [facility] ORS;  Service: General;  Laterality: N/A;  . SPLENECTOMY       OB History    Gravida  2   Para  1   Term  1   Preterm      AB  1   Living  1     SAB      TAB  1   Ectopic      Multiple  0   Live Births  1            Home Medications    Prior to Admission medications   Medication Sig Start Date End Date Taking? Authorizing Provider  aspirin EC 81 MG tablet Take 1 tablet (81 mg total) by mouth daily. 03/15/19   Lanae Boast, FNP  ergocalciferol (VITAMIN D2) 50000 units capsule Take 1 capsule (50,000 Units total) by mouth once a week.  05/22/18   Tresa Garter, MD  folic acid (FOLVITE) 1 MG tablet Take 1 tablet (1 mg total) by mouth daily. 03/15/19   Lanae Boast, FNP  ibuprofen (ADVIL) 800 MG tablet Take 1 tablet (800 mg total) by mouth 3 (three) times daily with meals as needed. Patient taking differently: Take 800 mg by mouth 3 (three) times daily with meals as needed for moderate pain.  03/15/19   Lanae Boast, FNP  morphine (MS CONTIN) 60 MG 12 hr tablet Take 1 tablet (60 mg total) by mouth every 12 (twelve) hours. 05/18/19 06/17/19  Lanae Boast, FNP  Oxycodone HCl 10 MG TABS Take 1 tablet (10 mg total) by mouth every 6 (six) hours as needed for up to 15 days (PAIN). 05/14/19 05/29/19  Lanae Boast, FNP  Prenatal Vit-Fe Fumarate-FA (PREPLUS) 27-1 MG TABS Take 1 tablet by mouth daily. 08/17/18   [name], MD    Family History Family History  Problem Relation Age of Onset  . Hypertension Mother   . Sickle cell anemia Sister   . Kidney disease Sister        Lupus  . Arthritis Sister   . Sickle cell anemia Sister   . Sickle cell trait Sister   . Heart disease Maternal Aunt        CABG  . Heart disease Maternal Uncle        CABG  . Lupus Sister     Social History Social History   Tobacco Use  . Smoking status: Current Some Day Smoker    Types: Cigarettes  . Smokeless tobacco: Never Used  Substance Use Topics  . Alcohol use: No  . Drug use: No     Allergies   Patient has no known allergies.   Review of Systems Review of Systems  All other systems reviewed and are negative.    Physical Exam Updated Vital Signs BP 118/79 (BP Location: Right Arm)   Pulse 72   Temp 98.9 F (37.2 C) (Oral)   Resp 16   LMP 04/27/2019   SpO2 100%   Physical Exam Vitals signs and nursing note reviewed.    28 year old female, resting comfortably and in no acute distress. Vital signs are normal. Oxygen saturation is 100%, which is normal. Head is normocephalic and atraumatic. PERRLA, EOMI. Oropharynx is clear. Neck is nontender and supple without adenopathy or JVD. Back is nontender and there is no CVA tenderness. Lungs are clear without rales, wheezes, or rhonchi. Chest is nontender. Heart has regular rate and rhythm with 2/6 systolic ejection murmur heard along the left sternal border. Abdomen is soft, flat, nontender without masses or hepatosplenomegaly and peristalsis is normoactive. Extremities have no cyanosis or edema, full range of motion is present. Skin is warm and dry without rash. Neurologic: Mental status is normal, cranial nerves are intact, there are no motor or sensory deficits.  ED Treatments / Results  Labs (all labs ordered are listed, but only abnormal results are displayed) Labs Reviewed  COMPREHENSIVE METABOLIC PANEL - Abnormal; Notable for the  following components:      Result Value   CO2 20 (*)    Total Bilirubin 1.3 (*)    All other components within normal limits  CBC WITH DIFFERENTIAL/PLATELET - Abnormal; Notable for the following components:   RBC 3.23 (*)    Hemoglobin 9.2 (*)    HCT 26.4 (*)    RDW 16.1 (*)    Platelets 535 (*)  nRBC 0.4 (*)    All other components within normal limits  RETICULOCYTES - Abnormal; Notable for the following components:   Retic Ct Pct 3.5 (*)    RBC. 3.23 (*)    Immature Retic Fract 26.2 (*)    All other components within normal limits  SARS CORONAVIRUS 2 (HOSPITAL ORDER, [place] [facility])  CBC WITH DIFFERENTIAL/PLATELET  URINALYSIS, ROUTINE W REFLEX MICROSCOPIC  I-STAT BETA HCG BLOOD, ED ([facility], [facility], [facility] ONLY)  I-STAT BETA HCG BLOOD, ED ([facility], [facility], [facility] ONLY)    Procedures Procedures   Medications Ordered in ED Medications  ketorolac (TORADOL) 30 MG/ML injection 30 mg (has no administration in time range)  HYDROmorphone (DILAUDID) injection 2 mg (has no administration in time range)    Or  HYDROmorphone (DILAUDID) injection 2 mg (has no administration in time range)  HYDROmorphone (DILAUDID) injection 2 mg (has no administration in time range)    Or  HYDROmorphone (DILAUDID) injection 2 mg (has no administration in time range)  HYDROmorphone (DILAUDID) injection 2 mg (has no administration in time range)    Or  HYDROmorphone (DILAUDID) injection 2 mg (has no administration in time range)  HYDROmorphone (DILAUDID) injection 2 mg (has no administration in time range)    Or  HYDROmorphone (DILAUDID) injection 2 mg (has no administration in time range)     Initial Impression / Assessment and Plan / ED Course  I have reviewed the triage vital signs and the nursing notes.  Pertinent lab results that were available during my care of the patient were reviewed by me and considered in my medical decision making (see chart for details).  Sickle cell vaso-occlusive  crisis.  Old records are reviewed confirming ED visit yesterday for sickle cell crisis.  Also, numerous other ED visits and hospitalizations for sickle cell pain crises.  She is started on a sickle cell pain protocol.  She had only slight relief of pain with first injection of hydromorphone.  CBC is unchanged from yesterday and reticulocyte count is adequate.  Case is signed out to Dr. Sabra Heck.  Final Clinical Impressions(s) / ED Diagnoses   Final diagnoses:  Sickle cell pain crisis [facility])    ED Discharge Orders    None       Delora Fuel, MD 65/03/54 [phone]

## 2019-05-18 ENCOUNTER — Emergency Department (HOSPITAL_COMMUNITY)
Admission: EM | Admit: 2019-05-18 | Discharge: 2019-05-18 | Disposition: A | Discharge status: Home / Self Care | Payer: Medicaid Other | Attending: Emergency Medicine | Admitting: Emergency Medicine

## 2019-05-18 ENCOUNTER — Other Ambulatory Visit: Payer: Self-pay

## 2019-05-18 ENCOUNTER — Telehealth: Payer: Self-pay | Admitting: Obstetrics & Gynecology

## 2019-05-18 ENCOUNTER — Telehealth: Payer: Self-pay

## 2019-05-18 DIAGNOSIS — Z79899 Other long term (current) drug therapy: Secondary | ICD-10-CM | POA: Diagnosis not present

## 2019-05-18 DIAGNOSIS — Z7982 Long term (current) use of aspirin: Secondary | ICD-10-CM | POA: Diagnosis not present

## 2019-05-18 DIAGNOSIS — D5700 Hb-SS disease with crisis, unspecified: Secondary | ICD-10-CM | POA: Insufficient documentation

## 2019-05-18 DIAGNOSIS — F17210 Nicotine dependence, cigarettes, uncomplicated: Secondary | ICD-10-CM | POA: Insufficient documentation

## 2019-05-18 DIAGNOSIS — M791 Myalgia, unspecified site: Secondary | ICD-10-CM | POA: Diagnosis present

## 2019-05-18 LAB — COMPREHENSIVE METABOLIC PANEL WITH GFR
ALT: 26 U/L (ref 0–44)
AST: 28 U/L (ref 15–41)
Albumin: 4.3 g/dL (ref 3.5–5.0)
Alkaline Phosphatase: 49 U/L (ref 38–126)
Anion gap: 10 (ref 5–15)
BUN: 11 mg/dL (ref 6–20)
CO2: 19 mmol/L — ABNORMAL LOW (ref 22–32)
Calcium: 9.6 mg/dL (ref 8.9–10.3)
Chloride: 111 mmol/L (ref 98–111)
Creatinine, Ser: 0.74 mg/dL (ref 0.44–1.00)
GFR calc Af Amer: >60 mL/min
GFR calc non Af Amer: >60 mL/min
Glucose, Bld: 88 mg/dL (ref 70–99)
Potassium: 3.8 mmol/L (ref 3.5–5.1)
Sodium: 140 mmol/L (ref 135–145)
Total Bilirubin: 1.6 mg/dL — ABNORMAL HIGH (ref 0.3–1.2)
Total Protein: 8 g/dL (ref 6.5–8.1)

## 2019-05-18 LAB — CBC WITH DIFFERENTIAL/PLATELET
Abs Immature Granulocytes: 0.01 10*3/uL (ref 0.00–0.07)
Basophils Absolute: 0 10*3/uL (ref 0.0–0.1)
Basophils Relative: 1 %
Eosinophils Absolute: 0.2 10*3/uL (ref 0.0–0.5)
Eosinophils Relative: 3 %
HCT: 27 % — ABNORMAL LOW (ref 36.0–46.0)
Hemoglobin: 9.5 g/dL — ABNORMAL LOW (ref 12.0–15.0)
Immature Granulocytes: 0 %
Lymphocytes Relative: 35 %
Lymphs Abs: 2.2 10*3/uL (ref 0.7–4.0)
MCH: 29 pg (ref 26.0–34.0)
MCHC: 35.2 g/dL (ref 30.0–36.0)
MCV: 82.3 fL (ref 80.0–100.0)
Monocytes Absolute: 0.9 10*3/uL (ref 0.1–1.0)
Monocytes Relative: 14 %
Neutro Abs: 3 10*3/uL (ref 1.7–7.7)
Neutrophils Relative %: 47 %
Platelets: 548 10*3/uL — ABNORMAL HIGH (ref 150–400)
RBC: 3.28 MIL/uL — ABNORMAL LOW (ref 3.87–5.11)
RDW: 15.9 % — ABNORMAL HIGH (ref 11.5–15.5)
WBC: 6.4 10*3/uL (ref 4.0–10.5)
nRBC: 0.3 % — ABNORMAL HIGH (ref 0.0–0.2)

## 2019-05-18 LAB — I-STAT BETA HCG BLOOD, ED (MC, WL, AP ONLY): I-stat hCG, quantitative: <5 m[IU]/mL (ref <5)

## 2019-05-18 LAB — RETICULOCYTES
Immature Retic Fract: 20.8 % — ABNORMAL HIGH (ref 2.3–15.9)
RBC.: 3.28 MIL/uL — ABNORMAL LOW (ref 3.87–5.11)
Retic Count, Absolute: 110.2 10*3/uL (ref 19.0–186.0)
Retic Ct Pct: 3.4 % — ABNORMAL HIGH (ref 0.4–3.1)

## 2019-05-18 MED ORDER — KETOROLAC TROMETHAMINE 30 MG/ML IJ SOLN
30.0000 mg | INTRAMUSCULAR | Status: AC
  Administered 2019-05-18: 30 mg via INTRAVENOUS
  Filled 2019-05-18: qty 1

## 2019-05-18 MED ORDER — HYDROMORPHONE HCL 1 MG/ML IJ SOLN
1.0000 mg | Freq: Once | INTRAMUSCULAR | Status: AC
  Administered 2019-05-18: 1 mg via INTRAVENOUS
  Filled 2019-05-18: qty 1

## 2019-05-18 MED ORDER — KETAMINE HCL 50 MG/5ML IJ SOSY
0.3000 mg/kg | PREFILLED_SYRINGE | Freq: Once | INTRAMUSCULAR | Status: AC
  Administered 2019-05-18: 22 mg via INTRAVENOUS
  Filled 2019-05-18: qty 5

## 2019-05-18 MED ORDER — DEXTROSE-NACL 5-0.45 % IV SOLN
INTRAVENOUS | Status: DC
  Administered 2019-05-18: 05:00:00 via INTRAVENOUS

## 2019-05-18 MED ORDER — HYDROMORPHONE HCL 1 MG/ML IJ SOLN
1.0000 mg | INTRAMUSCULAR | Status: AC
  Administered 2019-05-18 (×2): 1 mg via INTRAVENOUS
  Filled 2019-05-18 (×2): qty 1

## 2019-05-18 MED ORDER — DIPHENHYDRAMINE HCL 25 MG PO CAPS: 25.0000 mg | ORAL_CAPSULE | Freq: Four times a day (QID) | ORAL | Status: DC | PRN

## 2019-05-18 MED ORDER — HYDROMORPHONE HCL 1 MG/ML IJ SOLN
2.0000 mg | Freq: Once | INTRAMUSCULAR | Status: AC
  Administered 2019-05-18: 2 mg via INTRAVENOUS
  Filled 2019-05-18: qty 2

## 2019-05-18 MED ORDER — DIPHENHYDRAMINE HCL 25 MG PO CAPS: 25.0000 mg | ORAL_CAPSULE | ORAL | Status: DC | PRN

## 2019-05-18 MED ORDER — ONDANSETRON HCL 4 MG/2ML IJ SOLN
4.0000 mg | INTRAMUSCULAR | Status: DC | PRN
  Administered 2019-05-18: 4 mg via INTRAVENOUS
  Filled 2019-05-18: qty 2

## 2019-05-18 NOTE — ED Notes (Signed)
Entered pt room to give 1mg  of dilaudid, per order. Pt states that when she was here yesterday, she got  3 doses of 2mg  of dilaudid. Pt wants to know why she was only receiving a total of 4mg  of dilaudid. PA updated and at bedside to talk to pt. After PA talked to pt, this RN was called to room. Pt told this RN that PA said " you sickle cell people have a bad reputation and we cant just give you what you want." Pt stating that PA was discriminating against pt bc of sickle cell. This RN was waiting outside the room, for most of the conversation, waiting for  PA to finish and did not hear PA say those things. Pt is tearful and yelling for this RN to call her family member that works here stating "he is in charge of something." Informed pt there is no way for this RN to find family member that works in hospital. Reported to Dr Dina Rich. Dr Dina Rich at bedside

## 2019-05-18 NOTE — Telephone Encounter (Signed)
Called, no answer. Left a message to call back when available.

## 2019-05-18 NOTE — Discharge Instructions (Addendum)
You were evaluated in the Emergency Department and after careful evaluation, we did not find any emergent condition requiring admission or further testing in the hospital. ° °Please return to the Emergency Department if you experience any worsening of your condition.  We encourage you to follow up with a primary care provider.  Thank you for allowing us to be a part of your care. °

## 2019-05-18 NOTE — Telephone Encounter (Signed)
Patient called and states she just left the ER and is having a sickle cell crisis. She is asking for a note for work for today and wants to know if this can be sent through my chart? She states the ER would not give her an excuses for work. Please advise. Thanks!

## 2019-05-18 NOTE — Telephone Encounter (Signed)
Attempted to reach patient about her appointment. Left a message for her to call the office.

## 2019-05-18 NOTE — ED Provider Notes (Signed)
  Provider Note MRN:  [id number]  Arrival date & time: 05/18/19    ED Course and Medical Decision Making  Assumed care from PA Humes at shift change.  Sickle cell pain crisis, normal vital signs, attempting to control pain, will need reassessment.  Consider admission if not feeling better after the second dose of Dilaudid with the ketamine.  7:35 AM update: Upon reassessment patient is looking comfortable.  She states that the medicine she recently received has taken the edge off and feels well enough to go home.  After the discussed management above, the patient was determined to be safe for discharge.  The patient was in agreement with this plan and all questions regarding their care were answered.  ED return precautions were discussed and the patient will return to the ED with any significant worsening of condition.  Final Clinical Impressions(s) / ED Diagnoses     ICD-10-CM   1. Sickle cell anemia with pain [facility])  D57.00     ED Discharge Orders    None       Barth Kirks. Sedonia Small, [place] [email or web address]    Maudie Flakes, MD 05/18/19 [phone]

## 2019-05-18 NOTE — ED Notes (Signed)
Pt agreed to take 1mg  of dilaudid and ketamine. Updated Dr Dina Rich. Entered pt room to give 1mg  dilaudid and pt yelled "where is the ketamine?" Informed pt that this RN cannot push ketamine and dilaudid together. Pt angry and asking why?! Pt on the phone with someone stating " the Doctor said you sickle cell people dont have a plan." Pt tearful

## 2019-05-18 NOTE — ED Provider Notes (Addendum)
Medical screening examination/treatment/procedure(s) were conducted as a shared visit with non-physician practitioner(s) and myself.  I personally evaluated the patient during the encounter.     Patient presents with sickle cell crises.  She has had 2 recent evaluations in the last 2 days for the same.  Her labs are stable.  I was asked to evaluate the patient at her request as she is unhappy with the medications that have been ordered.  She is very tearful upon entering the room.  She states frequently that "why my not getting what I normally get."  She is very accusatory of the staff and PA.  I tried to discuss with the patient that practice patterns do vary.  We are happy to treat her pain.  The pain regimen prescribed for her today is appropriate and I encouraged her to follow through with it.  She has only received 1 dose of 2mg  Dilaudid and has refused the additional doses.  I have offered also adjunctive ketamine which the patient states "that is a horse tranquilizer."  I tried to explain to the patient that it hits different receptors and would likely be very helpful in pain management.  Patient continues to yell and request to speak to someone in charge.  I discussed with her that today I was ultimately in charge of her care and I was supportive of the proposed pain management plan in addition to offering ketamine.  Patient continues to refuse.  Charge nurse requested to bedside.  6:45 AM Patient now amenable to planned pain regimen.  She is also amenable to ketamine.  We will attempt this to see how it helps with her pain control.   Merryl Hacker, MD 05/18/19 [id number]    Merryl Hacker, MD 05/19/19 [phone]

## 2019-05-18 NOTE — Telephone Encounter (Signed)
Sent the note to patient.

## 2019-05-18 NOTE — ED Provider Notes (Signed)
[facility] EMERGENCY DEPARTMENT Provider Note   CSN: [id number] Arrival date & time: 05/18/19  0435    History   Chief Complaint Chief Complaint  Patient presents with   Generalized Body Aches    HPI Carmen [name] is a 28 y.o. female.     28 year old female with a history of sickle cell anemia, chronic musculoskeletal pain presents to the emergency department for complaints of pain "all over" x3 days.  Pain constant, worsening.  This is the patient's third visit to the emergency department for similar complaints.  She felt as though her pain could be controlled outpatient when discharged this morning.  However, has been taking her home morphine and oxycodone without pain relief.  Denies fever, chest pain, shortness of breath, vomiting or diarrhea, abdominal pain.  No known sick contacts.  She is not breast-feeding.  Followed by Dr. Nathaneil Canary at the [facility].  The history is provided by the patient. No language interpreter was used.    Past Medical History:  Diagnosis Date   Headache(784.0)    Sickle cell crisis ([place])    Syphilis 2015   Was diagnosed and received one injection of antibiotics   Thrombocytosis ([place]) 11/22/2014     CBC Latest Ref Rng & Units 12/13/2018 12/11/2018 12/10/2018 WBC 4.0 - 10.5 K/uL 14.8(H) 13.2(H) 15.9(H) Hemoglobin 12.0 - 15.0 g/dL 8.2(L) 7.7(L) 8.1(L) Hematocrit 36.0 - 46.0 % 23.7(L) 23.2(L) 24.5(L) Platelets 150 - 400 K/uL 326 399 388      Patient Active Problem List   Diagnosis Date Noted   Sickle cell disease ([place]) 12/04/2018   Alpha thalassemia silent carrier 12/04/2018   Chronic prescription opiate use 03/13/2018   Chronic musculoskeletal pain 03/13/2018   Vitamin D deficiency 01/13/2018   Leukocytosis 08/21/2017   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) 08/15/2016   Anemia of chronic disease    Systolic ejection murmur 82/42/3536   Sickle cell  anemia of mother during pregnancy ([place]) 05/16/2014    Past Surgical History:  Procedure Laterality Date   CESAREAN SECTION N/A 02/05/2019   Procedure: CESAREAN SECTION;  Surgeon: [name], MD;  Location: [facility];  Service: Obstetrics;  Laterality: N/A;   CHOLECYSTECTOMY N/A 11/30/2014   Procedure: LAPAROSCOPIC CHOLECYSTECTOMY SINGLE SITE WITH INTRAOPERATIVE CHOLANGIOGRAM;  Surgeon: Michael Boston, MD;  Location: [facility] ORS;  Service: General;  Laterality: N/A;   SPLENECTOMY       OB History    Gravida  2   Para  1   Term  1   Preterm      AB  1   Living  1     SAB      TAB  1   Ectopic      Multiple  0   Live Births  1            Home Medications    Prior to Admission medications   Medication Sig Start Date End Date Taking? Authorizing Provider  aspirin EC 81 MG tablet Take 1 tablet (81 mg total) by mouth daily. 03/15/19   Lanae Boast, FNP  ergocalciferol (VITAMIN D2) 50000 units capsule Take 1 capsule (50,000 Units total) by mouth once a week. 05/22/18   Tresa Garter, MD  folic acid (FOLVITE) 1 MG tablet Take 1 tablet (1 mg total) by mouth daily. 03/15/19   Lanae Boast, FNP  ibuprofen (ADVIL) 800 MG tablet Take 1 tablet (800 mg  total) by mouth 3 (three) times daily with meals as needed. Patient taking differently: Take 800 mg by mouth 3 (three) times daily with meals as needed for moderate pain.  03/15/19   Lanae Boast, FNP  morphine (MS CONTIN) 60 MG 12 hr tablet Take 1 tablet (60 mg total) by mouth every 12 (twelve) hours. 05/18/19 06/17/19  Lanae Boast, FNP  Oxycodone HCl 10 MG TABS Take 1 tablet (10 mg total) by mouth every 6 (six) hours as needed for up to 15 days (PAIN). 05/14/19 05/29/19  Lanae Boast, FNP  Prenatal Vit-Fe Fumarate-FA (PREPLUS) 27-1 MG TABS Take 1 tablet by mouth daily. 08/17/18   [name], MD    Family History Family History  Problem Relation Age of Onset   Hypertension Mother    Sickle cell anemia  Sister    Kidney disease Sister        Lupus   Arthritis Sister    Sickle cell anemia Sister    Sickle cell trait Sister    Heart disease Maternal Aunt        CABG   Heart disease Maternal Uncle        CABG   Lupus Sister     Social History Social History   Tobacco Use   Smoking status: Current Some Day Smoker    Types: Cigarettes   Smokeless tobacco: Never Used  Substance Use Topics   Alcohol use: No   Drug use: No     Allergies   Patient has no known allergies.   Review of Systems Review of Systems Ten systems reviewed and are negative for acute change, except as noted in the HPI.    Physical Exam Updated Vital Signs BP 127/77    Pulse (!) 104    Temp 98.9 F (37.2 C) (Oral)    Resp 19    Ht 5\' 3"  (1.6 m)    Wt 72.6 kg    LMP 04/27/2019    SpO2 96%    BMI 28.34 kg/m   Physical Exam Vitals signs and nursing note reviewed.  Constitutional:      General: She is not in acute distress.    Appearance: She is well-developed. She is not diaphoretic.     Comments: Nontoxic appearing and in NAD  HENT:     Head: Normocephalic and atraumatic.  Eyes:     General: No scleral icterus.    Conjunctiva/sclera: Conjunctivae normal.  Neck:     Musculoskeletal: Normal range of motion.  Cardiovascular:     Rate and Rhythm: Normal rate and regular rhythm.     Pulses: Normal pulses.  Pulmonary:     Effort: Pulmonary effort is normal. No respiratory distress.     Comments: Respirations even and unlabored Musculoskeletal: Normal range of motion.  Skin:    General: Skin is warm and dry.     Coloration: Skin is not pale.     Findings: No erythema or rash.  Neurological:     General: No focal deficit present.     Mental Status: She is alert and oriented to person, place, and time.     Coordination: Coordination normal.     Comments: Moving all extremities spontaneously.  Ambulatory in the department with steady gait.  Psychiatric:        Behavior: Behavior  normal.      ED Treatments / Results  Labs (all labs ordered are listed, but only abnormal results are displayed) Labs Reviewed  COMPREHENSIVE METABOLIC PANEL -  Abnormal; Notable for the following components:      Result Value   CO2 19 (*)    Total Bilirubin 1.6 (*)    All other components within normal limits  CBC WITH DIFFERENTIAL/PLATELET - Abnormal; Notable for the following components:   RBC 3.28 (*)    Hemoglobin 9.5 (*)    HCT 27.0 (*)    RDW 15.9 (*)    Platelets 548 (*)    nRBC 0.3 (*)    All other components within normal limits  RETICULOCYTES - Abnormal; Notable for the following components:   Retic Ct Pct 3.4 (*)    RBC. 3.28 (*)    Immature Retic Fract 20.8 (*)    All other components within normal limits  I-STAT BETA HCG BLOOD, ED ([facility], [facility], [facility] ONLY)    EKG None  Radiology No results found.  Procedures Procedures (including critical care time)  Medications Ordered in ED Medications  ondansetron (ZOFRAN) injection 4 mg (4 mg Intravenous Given 05/18/19 0516)  diphenhydrAMINE (BENADRYL) capsule 25-50 mg (has no administration in time range)  dextrose 5 %-0.45 % sodium chloride infusion ( Intravenous New Bag/Given 05/18/19 0522)  HYDROmorphone (DILAUDID) injection 1 mg (1 mg Intravenous Given 05/18/19 0643)  ketorolac (TORADOL) 30 MG/ML injection 30 mg (30 mg Intravenous Given 05/18/19 0516)  HYDROmorphone (DILAUDID) injection 2 mg (2 mg Intravenous Given 05/18/19 0516)  ketamine 50 mg in normal saline 5 mL (10 mg/mL) syringe (22 mg Intravenous Given 05/18/19 0650)       Initial Impression / Assessment and Plan / ED Course  I have reviewed the triage vital signs and the nursing notes.  Pertinent labs & imaging results that were available during my care of the patient were reviewed by me and considered in my medical decision making (see chart for details).        5:15 AM 27 y/o female with hx of sickle cell anemia presents for continued pain. Seen over  the past 48 hours x 2. Continues to report uncontrolled pain despite home Oxycodone and Morphine. Denies fevers, CP, SOB. Will give 2mg  IV Dilaudid, Toradol, obtain labs and reassess.  6:08 AM Called to the patient's room by RN at patient request. Patient expresses frustration about only being ordered 1mg  of Dilaudid on her second dose of pain medication. States that she "always" gets 2mg  x 3 and this is part of her "care plan". Discussed that she does not have a care plan, but we do have a sickle cell order set in place for guidelines on care. Patient exclaims "I am sorry that I have sickle cell and you don't want to treat me". Told patient that I am happy to treat her and we are treating her pain with multiple doses of narcotics ordered. This is also her 3rd visit in 3 days for continued ongoing pain. I had already broached discussion of likely need for admission given recurrent visits for pain without adequate outpatient pain control. Attempted to offer treatment with Ketamine, but patient crying and agitated and not allowing this writer to speak or finish a sentence. Patient states that I am keeping her "away" from her son and she wants to speak with another provider. MD Horton notified.  6:15 AM Labs reviewed and reassuring. Hemoglobin improved from earlier today. Reticulocyte ct pct 3.4, stable from 3.5 yesterday. Patient continues to refuse administration of all ordered IV narcotics. Speaking with Charge RN.  6:45 AM Patient has agreed to continue with course of treatment including 1mg   Dilaudid q 30 min x 2 in addition to IV Ketamine. Medications ordered.  7:11 AM Patient signed out to MD Bero at shift change who will assume care and disposition appropriately.   Final Clinical Impressions(s) / ED Diagnoses   Final diagnoses:  Sickle cell anemia with pain [facility])    ED Discharge Orders    None       Antonietta Breach, PA-C 05/18/19 1914    Merryl Hacker, MD 05/19/19 [phone]

## 2019-05-18 NOTE — ED Triage Notes (Signed)
Pt c/o pain all over x4 days. Was seen on the 05/17/19 for the same (sickle cell crisis).

## 2019-05-18 NOTE — ED Notes (Signed)
Entered pt room to give ketamine. Pt states that PA and MD are trump supporters and they are against her. Informed pt of possible side effects of ketamine. After ketamine given pt states she is seeing things and starts yelling "why are they doing this to me." Stayed with pt for 7 minutes. VS stable. NAD NARD

## 2019-05-21 ENCOUNTER — Telehealth (INDEPENDENT_AMBULATORY_CARE_PROVIDER_SITE_OTHER): Payer: Medicaid Other | Admitting: Obstetrics & Gynecology

## 2019-05-21 ENCOUNTER — Telehealth: Payer: Self-pay | Admitting: Obstetrics & Gynecology

## 2019-05-21 ENCOUNTER — Other Ambulatory Visit: Payer: Self-pay

## 2019-05-21 ENCOUNTER — Encounter: Payer: Self-pay | Admitting: Obstetrics & Gynecology

## 2019-05-21 DIAGNOSIS — Z3009 Encounter for other general counseling and advice on contraception: Secondary | ICD-10-CM | POA: Diagnosis not present

## 2019-05-21 NOTE — Progress Notes (Signed)
I connected with  Carmen [name] on 05/21/19 at  8:55 AM EDT by telephone and verified that I am speaking with the correct person using two identifiers.   I discussed the limitations, risks, security and privacy concerns of performing an evaluation and management service by telephone and the availability of in person appointments. I also discussed with the patient that there may be a patient responsible charge related to this service. The patient expressed understanding and agreed to proceed.  [place], [place] 05/21/2019  9:57 AM

## 2019-05-21 NOTE — Progress Notes (Signed)
TELEHEALTH GYNECOLOGY VIRTUAL VIDEO VISIT ENCOUNTER NOTE  Provider location: Center for [employer] at [facility]   I connected with Carmen [name] on 05/21/19 at  8:55 AM EDT by MyChart Video Encounter at home and verified that I am speaking with the correct person using two identifiers.   I discussed the limitations, risks, security and privacy concerns of performing an evaluation and management service virtually and the availability of in person appointments. I also discussed with the patient that there may be a patient responsible charge related to this service. The patient expressed understanding and agreed to proceed.   History:  Carmen [name] is a 28 y.o. G35P1011 female being evaluated today for contraception. Currently on Depo Provera, received immediately postpartum and is nine days late for the next shot.  Is considering BTS, but unsure about this as she is no longer with her FOB, and her current boyfriend has no children. She denies any abnormal vaginal discharge, bleeding, pelvic pain or other concerns.    Past Medical History:  Diagnosis Date  . Headache(784.0)   . Sickle cell crisis ([place])   . Syphilis 2015   Was diagnosed and received one injection of antibiotics  . Thrombocytosis ([place]) 11/22/2014     CBC Latest Ref Rng & Units 12/13/2018 12/11/2018 12/10/2018 WBC 4.0 - 10.5 K/uL 14.8(H) 13.2(H) 15.9(H) Hemoglobin 12.0 - 15.0 g/dL 8.2(L) 7.7(L) 8.1(L) Hematocrit 36.0 - 46.0 % 23.7(L) 23.2(L) 24.5(L) Platelets 150 - 400 K/uL 326 399 388     Past Surgical History:  Procedure Laterality Date  . CESAREAN SECTION N/A 02/05/2019   Procedure: CESAREAN SECTION;  Surgeon: [name], MD;  Location: [facility] LD ORS;  Service: Obstetrics;  Laterality: N/A;  . CHOLECYSTECTOMY N/A 11/30/2014   Procedure: LAPAROSCOPIC CHOLECYSTECTOMY SINGLE SITE WITH INTRAOPERATIVE CHOLANGIOGRAM;  Surgeon: Michael Boston, MD;  Location: [facility] ORS;  Service: General;  Laterality: N/A;  . SPLENECTOMY     The  following portions of the patient's history were reviewed and updated as appropriate: allergies, current medications, past family history, past medical history, past social history, past surgical history and problem list.   Review of Systems:  Pertinent items noted in HPI and remainder of comprehensive ROS otherwise negative.  Physical Exam:   General:  Alert, oriented and cooperative. Patient appears to be in no acute distress.  Mental Status: Normal mood and affect. Normal behavior. Normal judgment and thought content.   Respiratory: Normal respiratory effort, no problems with respiration noted  Rest of physical exam deferred due to type of encounter  Labs and Imaging Results for orders placed or performed during the hospital encounter of 05/18/19 (from the past 336 hour(s))  Comprehensive metabolic panel   Collection Time: 05/18/19  4:52 AM  Result Value Ref Range   Sodium 140 135 - 145 mmol/L   Potassium 3.8 3.5 - 5.1 mmol/L   Chloride 111 98 - 111 mmol/L   CO2 19 (L) 22 - 32 mmol/L   Glucose, Bld 88 70 - 99 mg/dL   BUN 11 6 - 20 mg/dL   Creatinine, Ser 0.74 0.44 - 1.00 mg/dL   Calcium 9.6 8.9 - 10.3 mg/dL   Total Protein 8.0 6.5 - 8.1 g/dL   Albumin 4.3 3.5 - 5.0 g/dL   AST 28 15 - 41 U/L   ALT 26 0 - 44 U/L   Alkaline Phosphatase 49 38 - 126 U/L   Total Bilirubin 1.6 (H) 0.3 - 1.2 mg/dL   GFR calc non  Af Amer >60 >60 mL/min   GFR calc Af Amer >60 >60 mL/min   Anion gap 10 5 - 15  CBC with Differential   Collection Time: 05/18/19  4:52 AM  Result Value Ref Range   WBC 6.4 4.0 - 10.5 K/uL   RBC 3.28 (L) 3.87 - 5.11 MIL/uL   Hemoglobin 9.5 (L) 12.0 - 15.0 g/dL   HCT 27.0 (L) 36.0 - 46.0 %   MCV 82.3 80.0 - 100.0 fL   MCH 29.0 26.0 - 34.0 pg   MCHC 35.2 30.0 - 36.0 g/dL   RDW 15.9 (H) 11.5 - 15.5 %   Platelets 548 (H) 150 - 400 K/uL   nRBC 0.3 (H) 0.0 - 0.2 %   Neutrophils Relative % 47 %   Neutro Abs 3.0 1.7 - 7.7 K/uL   Lymphocytes Relative 35 %   Lymphs Abs 2.2  0.7 - 4.0 K/uL   Monocytes Relative 14 %   Monocytes Absolute 0.9 0.1 - 1.0 K/uL   Eosinophils Relative 3 %   Eosinophils Absolute 0.2 0.0 - 0.5 K/uL   Basophils Relative 1 %   Basophils Absolute 0.0 0.0 - 0.1 K/uL   Immature Granulocytes 0 %   Abs Immature Granulocytes 0.01 0.00 - 0.07 K/uL  Reticulocytes   Collection Time: 05/18/19  4:52 AM  Result Value Ref Range   Retic Ct Pct 3.4 (H) 0.4 - 3.1 %   RBC. 3.28 (L) 3.87 - 5.11 MIL/uL   Retic Count, Absolute 110.2 19.0 - 186.0 K/uL   Immature Retic Fract 20.8 (H) 2.3 - 15.9 %  I-Stat beta hCG blood, ED   Collection Time: 05/18/19  4:54 AM  Result Value Ref Range   I-stat hCG, quantitative <5.0 <5 mIU/mL   Comment 3          Results for orders placed or performed during the hospital encounter of 05/17/19 (from the past 336 hour(s))  Comprehensive metabolic panel   Collection Time: 05/17/19  5:18 AM  Result Value Ref Range   Sodium 138 135 - 145 mmol/L   Potassium 3.7 3.5 - 5.1 mmol/L   Chloride 111 98 - 111 mmol/L   CO2 20 (L) 22 - 32 mmol/L   Glucose, Bld 91 70 - 99 mg/dL   BUN 7 6 - 20 mg/dL   Creatinine, Ser 0.60 0.44 - 1.00 mg/dL   Calcium 9.5 8.9 - 10.3 mg/dL   Total Protein 7.2 6.5 - 8.1 g/dL   Albumin 4.1 3.5 - 5.0 g/dL   AST 29 15 - 41 U/L   ALT 26 0 - 44 U/L   Alkaline Phosphatase 56 38 - 126 U/L   Total Bilirubin 1.3 (H) 0.3 - 1.2 mg/dL   GFR calc non Af Amer >60 >60 mL/min   GFR calc Af Amer >60 >60 mL/min   Anion gap 7 5 - 15  SARS Coronavirus 2 (CEPHEID - Performed in [place] hospital lab), [facility] Order   Collection Time: 05/17/19  5:20 AM   Specimen: Nasopharyngeal Swab  Result Value Ref Range   SARS Coronavirus 2 NEGATIVE NEGATIVE  I-Stat beta hCG blood, ED ([facility], [facility], [facility] only)   Collection Time: 05/17/19  5:53 AM  Result Value Ref Range   I-stat hCG, quantitative <5.0 <5 mIU/mL   Comment 3          CBC with Differential/Platelet   Collection Time: 05/17/19  5:59 AM  Result Value Ref Range   WBC 5.3  4.0 -  10.5 K/uL   RBC 3.23 (L) 3.87 - 5.11 MIL/uL   Hemoglobin 9.2 (L) 12.0 - 15.0 g/dL   HCT 26.4 (L) 36.0 - 46.0 %   MCV 81.7 80.0 - 100.0 fL   MCH 28.5 26.0 - 34.0 pg   MCHC 34.8 30.0 - 36.0 g/dL   RDW 16.1 (H) 11.5 - 15.5 %   Platelets 535 (H) 150 - 400 K/uL   nRBC 0.4 (H) 0.0 - 0.2 %   Neutrophils Relative % 39 %   Neutro Abs 2.1 1.7 - 7.7 K/uL   Lymphocytes Relative 42 %   Lymphs Abs 2.2 0.7 - 4.0 K/uL   Monocytes Relative 14 %   Monocytes Absolute 0.7 0.1 - 1.0 K/uL   Eosinophils Relative 4 %   Eosinophils Absolute 0.2 0.0 - 0.5 K/uL   Basophils Relative 1 %   Basophils Absolute 0.0 0.0 - 0.1 K/uL   Immature Granulocytes 0 %   Abs Immature Granulocytes 0.01 0.00 - 0.07 K/uL  Reticulocytes   Collection Time: 05/17/19  5:59 AM  Result Value Ref Range   Retic Ct Pct 3.5 (H) 0.4 - 3.1 %   RBC. 3.23 (L) 3.87 - 5.11 MIL/uL   Retic Count, Absolute 113.1 19.0 - 186.0 K/uL   Immature Retic Fract 26.2 (H) 2.3 - 15.9 %  Urinalysis, Routine w reflex microscopic   Collection Time: 05/17/19  7:31 AM  Result Value Ref Range   Color, Urine YELLOW YELLOW   APPearance CLEAR CLEAR   Specific Gravity, Urine 1.012 1.005 - 1.030   pH 6.0 5.0 - 8.0   Glucose, UA NEGATIVE NEGATIVE mg/dL   Hgb urine dipstick SMALL (A) NEGATIVE   Bilirubin Urine NEGATIVE NEGATIVE   Ketones, ur NEGATIVE NEGATIVE mg/dL   Protein, ur NEGATIVE NEGATIVE mg/dL   Nitrite NEGATIVE NEGATIVE   Leukocytes,Ua NEGATIVE NEGATIVE   RBC / HPF 0-5 0 - 5 RBC/hpf   WBC, UA 0-5 0 - 5 WBC/hpf   Bacteria, UA NONE SEEN NONE SEEN  Results for orders placed or performed during the hospital encounter of 05/16/19 (from the past 336 hour(s))  Comprehensive metabolic panel   Collection Time: 05/16/19  3:23 AM  Result Value Ref Range   Sodium 141 135 - 145 mmol/L   Potassium 4.0 3.5 - 5.1 mmol/L   Chloride 110 98 - 111 mmol/L   CO2 24 22 - 32 mmol/L   Glucose, Bld 102 (H) 70 - 99 mg/dL   BUN 17 6 - 20 mg/dL   Creatinine, Ser  0.66 0.44 - 1.00 mg/dL   Calcium 9.4 8.9 - 10.3 mg/dL   Total Protein 8.5 (H) 6.5 - 8.1 g/dL   Albumin 4.5 3.5 - 5.0 g/dL   AST 30 15 - 41 U/L   ALT 26 0 - 44 U/L   Alkaline Phosphatase 60 38 - 126 U/L   Total Bilirubin 1.0 0.3 - 1.2 mg/dL   GFR calc non Af Amer >60 >60 mL/min   GFR calc Af Amer >60 >60 mL/min   Anion gap 7 5 - 15  CBC with Differential   Collection Time: 05/16/19  3:23 AM  Result Value Ref Range   WBC 7.8 4.0 - 10.5 K/uL   RBC 3.26 (L) 3.87 - 5.11 MIL/uL   Hemoglobin 9.2 (L) 12.0 - 15.0 g/dL   HCT 27.4 (L) 36.0 - 46.0 %   MCV 84.0 80.0 - 100.0 fL   MCH 28.2 26.0 - 34.0 pg   MCHC 33.6  30.0 - 36.0 g/dL   RDW 16.2 (H) 11.5 - 15.5 %   Platelets 511 (H) 150 - 400 K/uL   nRBC 0.4 (H) 0.0 - 0.2 %   Neutrophils Relative % 44 %   Neutro Abs 3.5 1.7 - 7.7 K/uL   Lymphocytes Relative 40 %   Lymphs Abs 3.2 0.7 - 4.0 K/uL   Monocytes Relative 11 %   Monocytes Absolute 0.9 0.1 - 1.0 K/uL   Eosinophils Relative 4 %   Eosinophils Absolute 0.3 0.0 - 0.5 K/uL   Basophils Relative 1 %   Basophils Absolute 0.1 0.0 - 0.1 K/uL   Immature Granulocytes 0 %   Abs Immature Granulocytes 0.02 0.00 - 0.07 K/uL  Reticulocytes   Collection Time: 05/16/19  3:23 AM  Result Value Ref Range   Retic Ct Pct 4.2 (H) 0.4 - 3.1 %   RBC. 3.26 (L) 3.87 - 5.11 MIL/uL   Retic Count, Absolute 137.2 19.0 - 186.0 K/uL   Immature Retic Fract 30.8 (H) 2.3 - 15.9 %  I-Stat beta hCG blood, ED   Collection Time: 05/16/19  3:34 AM  Result Value Ref Range   I-stat hCG, quantitative <5.0 <5 mIU/mL   Comment 3           No results found.     Assessment and Plan:     1. General counseling and advice on female contraception Reviewed all forms of birth control options available including abstinence; fertility period awareness methods; over the counter/barrier methods; hormonal contraceptive medication including pill, patch, ring, injection,contraceptive implant; hormonal and nonhormonal IUDs; permanent  sterilization options including vasectomy and the various tubal sterilization modalities. Risks and benefits reviewed.  Questions were answered.  Information was given to patient to review.  Given that is unsure, she wants to avoid permanent sterilization for now, will come in to get Depo Provera ASAP. Advised abstinence or protected intercourse until then.  Gave information about Nexplanon and IUD, she will consider these and let us know if she wants these LARCs instead.  Return in about 3 days (around 05/24/2019) for Depo Provera.    I discussed the assessment and treatment plan with the patient. The patient was provided an opportunity to ask questions and all were answered. The patient agreed with the plan and demonstrated an understanding of the instructions.   The patient was advised to call back or seek an in-person evaluation/go to the ED if the symptoms worsen or if the condition fails to improve as anticipated.  I provided 15 minutes of face-to-face time during this encounter.   Verita Schneiders, MD Center for [employer], [place]

## 2019-05-21 NOTE — Patient Instructions (Addendum)
Etonogestrel implant What is this medicine? ETONOGESTREL (et oh noe JES trel) is a contraceptive (birth control) device. It is used to prevent pregnancy. It can be used for up to 3 years. This medicine may be used for other purposes; ask your health care provider or pharmacist if you have questions. COMMON BRAND NAME(S): Implanon, Nexplanon What should I tell my health care provider before I take this medicine? They need to know if you have any of these conditions:  abnormal vaginal bleeding  blood vessel disease or blood clots  breast, cervical, endometrial, ovarian, liver, or uterine cancer  diabetes  gallbladder disease  heart disease or recent heart attack  high blood pressure  high cholesterol or triglycerides  kidney disease  liver disease  migraine headaches  seizures  stroke  tobacco smoker  an unusual or allergic reaction to etonogestrel, anesthetics or antiseptics, other medicines, foods, dyes, or preservatives  pregnant or trying to get pregnant  breast-feeding How should I use this medicine? This device is inserted just under the skin on the inner side of your upper arm by a health care professional. Talk to your pediatrician regarding the use of this medicine in children. Special care may be needed. Overdosage: If you think you have taken too much of this medicine contact a poison control center or emergency room at once. NOTE: This medicine is only for you. Do not share this medicine with others. What if I miss a dose? This does not apply. What may interact with this medicine? Do not take this medicine with any of the following medications:  amprenavir  fosamprenavir This medicine may also interact with the following medications:  acitretin  aprepitant  armodafinil  bexarotene  bosentan  carbamazepine  certain medicines for fungal infections like fluconazole, ketoconazole, itraconazole and voriconazole  certain medicines to treat  hepatitis, HIV or AIDS  cyclosporine  felbamate  griseofulvin  lamotrigine  modafinil  oxcarbazepine  phenobarbital  phenytoin  primidone  rifabutin  rifampin  rifapentine  St. John's wort  topiramate This list may not describe all possible interactions. Give your health care provider a list of all the medicines, herbs, non-prescription drugs, or dietary supplements you use. Also tell them if you smoke, drink alcohol, or use illegal drugs. Some items may interact with your medicine. What should I watch for while using this medicine? This product does not protect you against HIV infection (AIDS) or other sexually transmitted diseases. You should be able to feel the implant by pressing your fingertips over the skin where it was inserted. Contact your doctor if you cannot feel the implant, and use a non-hormonal birth control method (such as condoms) until your doctor confirms that the implant is in place. Contact your doctor if you think that the implant may have broken or become bent while in your arm. You will receive a user card from your health care provider after the implant is inserted. The card is a record of the location of the implant in your upper arm and when it should be removed. Keep this card with your health records. What side effects may I notice from receiving this medicine? Side effects that you should report to your doctor or health care professional as soon as possible:  allergic reactions like skin rash, itching or hives, swelling of the face, lips, or tongue  breast lumps, breast tissue changes, or discharge  breathing problems  changes in emotions or moods  if you feel that the implant may have broken or   bent while in your arm  high blood pressure  pain, irritation, swelling, or bruising at the insertion site  scar at site of insertion  signs of infection at the insertion site such as fever, and skin redness, pain or discharge  signs and  symptoms of a blood clot such as breathing problems; changes in vision; chest pain; severe, sudden headache; pain, swelling, warmth in the leg; trouble speaking; sudden numbness or weakness of the face, arm or leg  signs and symptoms of liver injury like dark yellow or brown urine; general ill feeling or flu-like symptoms; light-colored stools; loss of appetite; nausea; right upper belly pain; unusually weak or tired; yellowing of the eyes or skin  unusual vaginal bleeding, discharge Side effects that usually do not require medical attention (report to your doctor or health care professional if they continue or are bothersome):  acne  breast pain or tenderness  headache  irregular menstrual bleeding  nausea This list may not describe all possible side effects. Call your doctor for medical advice about side effects. You may report side effects to FDA at 1-800-FDA-1088. Where should I keep my medicine? This drug is given in a hospital or clinic and will not be stored at home. NOTE: This sheet is a summary. It may not cover all possible information. If you have questions about this medicine, talk to your doctor, pharmacist, or health care provider.  2020 Elsevier/Gold Standard (2017-09-06 14:11:42)    Intrauterine Device Insertion An intrauterine device (IUD) is a medical device that gets inserted into the uterus to prevent pregnancy. It is a small, T-shaped device that has one or two nylon strings hanging down from it. The strings hang out of the lower part of the uterus (cervix) to allow for future IUD removal. There are two types of IUDs available: Copper IUD. This type of IUD has copper wire wrapped around it. Copper makes the uterus and fallopian tubes produce a fluid that kills sperm. A copper IUD may last up to 10 years. Hormone IUD. This type of IUD is made of plastic and contains the hormone progestin (synthetic progesterone). The hormone thickens mucus in the cervix and prevents  sperm from entering the uterus. It also thins the uterine lining to prevent implantation of a fertilized egg. The hormone can weaken or kill the sperm that get into the uterus. A hormone IUD may last 3-5 years. Tell a health care provider about: Any allergies you have. All medicines you are taking, including vitamins, herbs, eye drops, creams, and over-the-counter medicines. Any problems you or family members have had with anesthetic medicines. Any blood disorders you have. Any surgeries you have had. Any medical conditions you have, including any STIs (sexually transmitted infections) you may have. Whether you are pregnant or may be pregnant. What are the risks? Generally, this is a safe procedure. However, problems may occur, including: Infection. Bleeding. Allergic reactions to medicines. Accidental puncture (perforation) of the uterus, or damage to other structures or organs. Accidental placement of the IUD either in the muscle layer of the uterus (myometrium) or outside the uterus. The IUD falling out of the uterus (expulsion). This is more common among women who have recently had a child. Pregnancy that happens in the fallopian tube (ectopic pregnancy). Infection of the uterus and fallopian tubes (pelvic inflammatory disease). What happens before the procedure? Schedule the IUD insertion for when you will have your menstrual period or right after, to make sure you are not pregnant. Placement of the IUD  is better tolerated shortly after a menstrual cycle. Follow instructions from your health care provider about eating or drinking restrictions. Ask your health care provider about changing or stopping your regular medicines. This is especially important if you are taking diabetes medicines or blood thinners. You may get a pain reliever to take before the procedure. You may have tests for: Pregnancy. A pregnancy test involves having a urine sample taken. STIs. Placing an IUD in someone  who has an STI can make the infection worse. Cervical cancer. You may have a Pap test to check for this type of cancer. This means collecting cells from your cervix to be examined under a microscope. You may have a physical exam to determine the size and position of your uterus. The procedure may vary among health care providers and hospitals. What happens during the procedure? A tool (speculum) will be placed in your vagina and widened so that your health care provider can see your cervix. Medicine may be applied to your cervix to help lower your risk of infection (antiseptic medicine). You may be given an anesthetic medicine to numb each side of your cervix (intracervical block or paracervical block). This medicine is usually given by an injection into the cervix. A tool (uterine sound) will be inserted into your uterus to determine the length of your uterus and the direction that your uterus may be tilted. A slim instrument or tube (IUD inserter) that holds the IUD will be inserted into your vagina, through your cervical canal, and into your uterus. The IUD will be placed in the uterus, and the IUD inserter will be removed. The strings that are attached to the IUD will be trimmed so that they lie just below the cervix. The procedure may vary among health care providers and hospitals. What happens after the procedure? You may have bleeding after the procedure. This is normal. It varies from light bleeding (spotting) for a few days to menstrual-like bleeding. You may have cramping and pain. You may feel dizzy or light-headed. You may have lower back pain. Summary An intrauterine device (IUD) is a small, T-shaped device that has one or two nylon strings hanging down from it. Two types of IUDs are available. You may have a copper IUD or a hormone IUD. Schedule the IUD insertion for when you will have your menstrual period or right after, to make sure you are not pregnant. Placement of the IUD is  better tolerated shortly after a menstrual cycle. You may have bleeding after the procedure. This is normal. It varies from light spotting for a few days to menstrual-like bleeding. This information is not intended to replace advice given to you by your health care provider. Make sure you discuss any questions you have with your health care provider. Document Released: 06/16/2011 Document Revised: 09/30/2017 Document Reviewed: 09/08/2016 Elsevier Patient Education  2020 [employer].

## 2019-05-21 NOTE — Telephone Encounter (Signed)
Attempted to call patient with her depo appointment ( 7/23 @ 3:00). No answer, left voicemail with the appointment information. Patient instructed to wear a face mask for the entire appointment and no visitors are allowed. Patient instructed if she has any symptoms not to attend the appointment and give the office a call to be reschedule. Symptom list and office number left.

## 2019-05-22 ENCOUNTER — Encounter: Payer: Self-pay | Admitting: Student

## 2019-05-24 ENCOUNTER — Ambulatory Visit (INDEPENDENT_AMBULATORY_CARE_PROVIDER_SITE_OTHER): Payer: Medicaid Other | Admitting: Lactation Services

## 2019-05-24 ENCOUNTER — Telehealth: Payer: Self-pay | Admitting: Family Medicine

## 2019-05-24 ENCOUNTER — Encounter: Payer: Self-pay | Admitting: Obstetrics & Gynecology

## 2019-05-24 ENCOUNTER — Other Ambulatory Visit: Payer: Self-pay

## 2019-05-24 DIAGNOSIS — Z3042 Encounter for surveillance of injectable contraceptive: Secondary | ICD-10-CM | POA: Diagnosis not present

## 2019-05-24 MED ORDER — MEDROXYPROGESTERONE ACETATE 150 MG/ML IM SUSP
150.0000 mg | Freq: Once | INTRAMUSCULAR | Status: AC
  Administered 2019-05-24: 150 mg via INTRAMUSCULAR

## 2019-05-24 NOTE — Progress Notes (Signed)
Pt here for Depo Shot. She had last dose on 02/10/19. Depo was due on 6/27-7/11.   Pt 12 days past getting Depo on Schedule. Pt denies Sexual intercourse in the last 2 weeks. Pt with negative Urine Pregnancy Test.   Spoke with Dr. Hulan Fray who approved giving Depo injection today. Pt received Depo injection and tolerated it well. Pt informed to use back up birth control for the next 2 weeks.   Pt to call with any questions/concerns as needed.

## 2019-05-24 NOTE — Telephone Encounter (Signed)
Attempted to call patient about her appointment on 7/23 @ 3:00. No answer, left voicemail instructing patient to wear a face mask for the entire appointment and no visitors are allowed during the visit. Patient instructed not to attend the appointment if she has any symptoms and give the office a call back to be rescheduled. Symptom list and office number left.

## 2019-05-25 LAB — POCT PREGNANCY, URINE: Preg Test, Ur: NEGATIVE

## 2019-06-04 ENCOUNTER — Telehealth: Payer: Self-pay

## 2019-06-04 DIAGNOSIS — Z79891 Long term (current) use of opiate analgesic: Secondary | ICD-10-CM

## 2019-06-04 DIAGNOSIS — D571 Sickle-cell disease without crisis: Secondary | ICD-10-CM

## 2019-06-04 MED ORDER — OXYCODONE HCL 10 MG PO TABS: 10.0000 mg | ORAL_TABLET | Freq: Four times a day (QID) | ORAL | 0 refills | Status: DC | PRN

## 2019-06-04 NOTE — Telephone Encounter (Signed)
refilled 

## 2019-06-07 ENCOUNTER — Ambulatory Visit: Payer: Self-pay | Admitting: Family Medicine

## 2019-06-13 ENCOUNTER — Ambulatory Visit: Payer: Self-pay | Admitting: Family Medicine

## 2019-06-15 ENCOUNTER — Encounter: Payer: Self-pay | Admitting: Family Medicine

## 2019-06-15 ENCOUNTER — Ambulatory Visit (INDEPENDENT_AMBULATORY_CARE_PROVIDER_SITE_OTHER): Payer: Medicaid Other | Admitting: Family Medicine

## 2019-06-15 ENCOUNTER — Other Ambulatory Visit: Payer: Self-pay

## 2019-06-15 VITALS — BP 106/65 | HR 60 | Temp 98.4°F | Resp 14 | Ht 63.0 in | Wt 164.0 lb

## 2019-06-15 DIAGNOSIS — Z79891 Long term (current) use of opiate analgesic: Secondary | ICD-10-CM

## 2019-06-15 DIAGNOSIS — D571 Sickle-cell disease without crisis: Secondary | ICD-10-CM | POA: Diagnosis not present

## 2019-06-15 LAB — POCT URINALYSIS DIPSTICK
Bilirubin, UA: NEGATIVE
Blood, UA: NEGATIVE
Glucose, UA: NEGATIVE
Ketones, UA: NEGATIVE
Leukocytes, UA: NEGATIVE
Nitrite, UA: NEGATIVE
Protein, UA: NEGATIVE
Spec Grav, UA: 1.015 (ref 1.010–1.025)
Urobilinogen, UA: 0.2 E.U./dL
pH, UA: 6 (ref 5.0–8.0)

## 2019-06-15 MED ORDER — MORPHINE SULFATE ER 60 MG PO TBCR: 60.0000 mg | EXTENDED_RELEASE_TABLET | Freq: Two times a day (BID) | ORAL | 0 refills | Status: DC

## 2019-06-15 NOTE — Patient Instructions (Signed)
Sickle Cell Anemia, Adult ° °Sickle cell anemia is a condition where your red blood cells are shaped like sickles. Red blood cells carry oxygen through the body. Sickle-shaped cells do not live as long as normal red blood cells. They also clump together and block blood from flowing through the blood vessels. This prevents the body from getting enough oxygen. Sickle cell anemia causes organ damage and pain. It also increases the risk of infection. °Follow these instructions at home: °Medicines °· Take over-the-counter and prescription medicines only as told by your doctor. °· If you were prescribed an antibiotic medicine, take it as told by your doctor. Do not stop taking the antibiotic even if you start to feel better. °· If you develop a fever, do not take medicines to lower the fever right away. Tell your doctor about the fever. °Managing pain, stiffness, and swelling °· Try these methods to help with pain: °? Use a heating pad. °? Take a warm bath. °? Distract yourself, such as by watching TV. °Eating and drinking °· Drink enough fluid to keep your pee (urine) clear or pale yellow. Drink more in hot weather and during exercise. °· Limit or avoid alcohol. °· Eat a healthy diet. Eat plenty of fruits, vegetables, whole grains, and lean protein. °· Take vitamins and supplements as told by your doctor. °Traveling °· When traveling, keep these with you: °? Your medical information. °? The names of your doctors. °? Your medicines. °· If you need to take an airplane, talk to your doctor first. °Activity °· Rest often. °· Avoid exercises that make your heart beat much faster, such as jogging. °General instructions °· Do not use products that have nicotine or tobacco, such as cigarettes and e-cigarettes. If you need help quitting, ask your doctor. °· Consider wearing a medical alert bracelet. °· Avoid being in high places (high altitudes), such as mountains. °· Avoid very hot or cold temperatures. °· Avoid places where the  temperature changes a lot. °· Keep all follow-up visits as told by your doctor. This is important. °Contact a doctor if: °· A joint hurts. °· Your feet or hands hurt or swell. °· You feel tired (fatigued). °Get help right away if: °· You have symptoms of infection. These include: °? Fever. °? Chills. °? Being very tired. °? Irritability. °? Poor eating. °? Throwing up (vomiting). °· You feel dizzy or faint. °· You have new stomach pain, especially on the left side. °· You have a an erection (priapism) that lasts more than 4 hours. °· You have numbness in your arms or legs. °· You have a hard time moving your arms or legs. °· You have trouble talking. °· You have pain that does not go away when you take medicine. °· You are short of breath. °· You are breathing fast. °· You have a long-term cough. °· You have pain in your chest. °· You have a bad headache. °· You have a stiff neck. °· Your stomach looks bloated even though you did not eat much. °· Your skin is pale. °· You suddenly cannot see well. °Summary °· Sickle cell anemia is a condition where your red blood cells are shaped like sickles. °· Follow your doctor's advice on ways to manage pain, food to eat, activities to do, and steps to take for safe travel. °· Get medical help right away if you have any signs of infection, such as a fever. °This information is not intended to replace advice given to you by   your health care provider. Make sure you discuss any questions you have with your health care provider. °Document Released: 08/08/2013 Document Revised: 02/09/2019 Document Reviewed: 11/23/2016 °Elsevier Patient Education © 2020 Elsevier Inc. ° °

## 2019-06-15 NOTE — Progress Notes (Signed)
[facility] MEDICINE AND SICKLE CELL CARE  SICKLE CELL ANEMIA FOLLOW UP VISIT PROVIDER: Lanae Boast, FNP    Subjective:   Carmen [name]  is a 28 y.o.  female who  has a past medical history of Headache(784.0), Sickle cell crisis ([place]), Syphilis (2015), and Thrombocytosis ([place]) (11/22/2014). presents for a follow up for Sickle Cell Anemia. The patient has had 2 admissions in the past 6 months.  Pain regimen includes: Ibuprofen and MS contin Oxycodone. States that this is working well for her. She has some unresolved pain in the right leg, but she is able to manage at home with her medications.  Hydrea Therapy: No Medication compliance: Yes The patient reports adequate daily hydration.  Her mood is reported as good. She is doing well and states that her son is 61 months old. She is enjoying motherhood.    Review of Systems  Constitutional: Negative.   HENT: Negative.   Eyes: Negative.   Respiratory: Negative.   Cardiovascular: Negative.   Gastrointestinal: Negative.   Genitourinary: Negative.   Musculoskeletal: Positive for myalgias (right lower extremity pain. No swelling).  Skin: Negative.   Neurological: Negative.   Psychiatric/Behavioral: Negative.     Objective:   Objective  BP 106/65 (BP Location: Left Arm, Patient Position: Sitting, Cuff Size: Normal)   Pulse 60   Temp 98.4 F (36.9 C) (Oral)   Resp 14   Ht 5\' 3"  (1.6 m)   Wt 164 lb (74.4 kg)   LMP 04/22/2019 Comment: still bleeding since June 06/15/19  SpO2 100%   BMI 29.05 kg/m   Wt Readings from Last 3 Encounters:  06/15/19 164 lb (74.4 kg)  05/18/19 160 lb (72.6 kg)  05/17/19 160 lb (72.6 kg)     Physical Exam Vitals signs and nursing note reviewed.  Constitutional:      General: She is not in acute distress.    Appearance: Normal appearance.  HENT:     Head: Normocephalic and atraumatic.  Eyes:     Extraocular Movements: Extraocular movements intact.     Conjunctiva/sclera:  Conjunctivae normal.     Pupils: Pupils are equal, round, and reactive to light.  Cardiovascular:     Rate and Rhythm: Normal rate and regular rhythm.     Heart sounds: No murmur.  Pulmonary:     Effort: Pulmonary effort is normal.     Breath sounds: Normal breath sounds.  Musculoskeletal: Normal range of motion.  Skin:    General: Skin is warm and dry.     Comments: No sores on the lower extremities.   Neurological:     Mental Status: She is alert and oriented to person, place, and time.  Psychiatric:        Mood and Affect: Mood normal.        Behavior: Behavior normal.        Thought Content: Thought content normal.        Judgment: Judgment normal.      Assessment/Plan:   Assessment   Encounter Diagnoses  Name Primary?  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 Chronic prescription opiate use      Plan  1. Hb-SS disease without crisis ([place]) - Urinalysis Dipstick - morphine (MS CONTIN) 60 MG 12 hr tablet; Take 1 tablet (60 mg total) by mouth every 12 (twelve) hours.  Dispense: 60 tablet; Refill: 0 - CBC with Differential  2. Chronic prescription opiate use - morphine (MS CONTIN) 60 MG 12 hr  tablet; Take 1 tablet (60 mg total) by mouth every 12 (twelve) hours.  Dispense: 60 tablet; Refill: 0    Return to care as scheduled and prn. Patient verbalized understanding and agreed with plan of care.   1. Sickle cell disease -   We discussed the need for good hydration, monitoring of hydration status, avoidance of heat, cold, stress, and infection triggers. We discussed the risks and benefits of Hydrea, including bone marrow suppression, the possibility of GI upset, skin ulcers, hair thinning, and teratogenicity. The patient was reminded of the need to seek medical attention of any symptoms of bleeding, anemia, or infection. Continue folic acid 1 mg daily to prevent aplastic bone marrow crises.   2. Pulmonary evaluation - Patient denies severe recurrent wheezes, shortness  of breath with exercise, or persistent cough. If these symptoms develop, pulmonary function tests with spirometry will be ordered, and if abnormal, plan on referral to Pulmonology for further evaluation.  3. Cardiac - Routine screening for pulmonary hypertension is not recommended.  4. Eye - High risk of proliferative retinopathy. Annual eye exam with retinal exam recommended to patient.  5. Immunization status -  Yearly influenza vaccination is recommended, as well as being up to date with Meningococcal and Pneumococcal vaccines.   6. Acute and chronic painful episodes - We discussed that pt is to receive Schedule II prescriptions only from [place]. Pt is also aware that the prescription history is available to [place] online through the [facility] CSRS. Controlled substance agreement signed. We reminded TINE MABEE that all patients receiving Schedule II narcotics must be seen for follow within one month of prescription being requested. We reviewed the terms of our pain agreement, including the need to keep medicines in a safe locked location away from children or pets, and the need to report excess sedation or constipation, measures to avoid constipation, and policies related to early refills and stolen prescriptions. According to the [place] Chronic Pain Initiative program, we have reviewed details related to analgesia, adverse effects, aberrant behaviors.  7. Iron overload from chronic transfusion.  Not applicable at this time.  If this occurs will use Exjade for management.   8. Vitamin D deficiency - Drisdol 50,000 units weekly. Patient encouraged to take as prescribed.   The above recommendations are taken from the NIH Evidence-Based Management of Sickle Cell Disease: Expert Panel Report, 20149.   [name], FNP-BC [facility] [address] [place], [place] [address] [phone]  This note has been created with Dragon speech recognition software and smart  phrase technology. Any transcriptional errors are unintentional.

## 2019-06-16 LAB — CBC WITH DIFFERENTIAL/PLATELET
Basophils Absolute: 0.1 10*3/uL (ref 0.0–0.2)
Basos: 1 %
EOS (ABSOLUTE): 0.3 10*3/uL (ref 0.0–0.4)
Eos: 4 %
Hematocrit: 29.5 % — ABNORMAL LOW (ref 34.0–46.6)
Hemoglobin: 9.8 g/dL — ABNORMAL LOW (ref 11.1–15.9)
Immature Grans (Abs): 0 10*3/uL (ref 0.0–0.1)
Immature Granulocytes: 0 %
Lymphocytes Absolute: 3.3 10*3/uL — ABNORMAL HIGH (ref 0.7–3.1)
Lymphs: 43 %
MCH: 28.7 pg (ref 26.6–33.0)
MCHC: 33.2 g/dL (ref 31.5–35.7)
MCV: 86 fL (ref 79–97)
Monocytes Absolute: 0.8 10*3/uL (ref 0.1–0.9)
Monocytes: 10 %
Neutrophils Absolute: 3.2 10*3/uL (ref 1.4–7.0)
Neutrophils: 42 %
Platelets: 425 10*3/uL (ref 150–450)
RBC: 3.42 x10E6/uL — ABNORMAL LOW (ref 3.77–5.28)
RDW: 15.8 % — ABNORMAL HIGH (ref 11.7–15.4)
WBC: 7.5 10*3/uL (ref 3.4–10.8)

## 2019-06-18 NOTE — Progress Notes (Signed)
Your labs are stable. Continue with your current medications. Please remember to keep your follow up appointment. If you have problems, questions or concerns, please make an appointment to discuss. Thanks!

## 2019-06-20 ENCOUNTER — Telehealth: Payer: Self-pay

## 2019-06-20 NOTE — Telephone Encounter (Signed)
Called and spoke with patient, advised that labs are stable and to continue with current medications. Patient verbalized understanding, Thanks!

## 2019-06-20 NOTE — Telephone Encounter (Signed)
-----   Message from Lanae Boast, [place] sent at 06/18/2019  1:16 PM EDT ----- Your labs are stable. Continue with your current medications. Please remember to keep your follow up appointment. If you have problems, questions or concerns, please make an appointment to discuss. Thanks!

## 2019-07-03 ENCOUNTER — Telehealth: Payer: Self-pay

## 2019-07-10 ENCOUNTER — Telehealth: Payer: Self-pay

## 2019-07-11 ENCOUNTER — Other Ambulatory Visit: Payer: Self-pay | Admitting: Family Medicine

## 2019-07-11 ENCOUNTER — Encounter (HOSPITAL_COMMUNITY): Payer: Self-pay

## 2019-07-11 ENCOUNTER — Encounter (HOSPITAL_COMMUNITY): Payer: Self-pay | Admitting: *Deleted

## 2019-07-11 DIAGNOSIS — Z79891 Long term (current) use of opiate analgesic: Secondary | ICD-10-CM

## 2019-07-11 DIAGNOSIS — D571 Sickle-cell disease without crisis: Secondary | ICD-10-CM

## 2019-07-11 MED ORDER — OXYCODONE HCL 10 MG PO TABS: 10.0000 mg | ORAL_TABLET | Freq: Four times a day (QID) | ORAL | 0 refills | Status: DC | PRN

## 2019-07-11 NOTE — Progress Notes (Signed)
Last filled on 06/04/2019. I did not receive a prescription request. I sent the medication in to the pharmacy today. Please let patient know. Thanks.

## 2019-07-11 NOTE — Telephone Encounter (Signed)
Last filled on 06/04/2019. I did not receive a prescription request. I sent the medication in to the pharmacy today. Please let patient know. Thanks.

## 2019-07-12 ENCOUNTER — Other Ambulatory Visit: Payer: Self-pay | Admitting: Family Medicine

## 2019-07-12 ENCOUNTER — Telehealth: Payer: Self-pay

## 2019-07-12 DIAGNOSIS — Z79891 Long term (current) use of opiate analgesic: Secondary | ICD-10-CM

## 2019-07-12 DIAGNOSIS — D571 Sickle-cell disease without crisis: Secondary | ICD-10-CM

## 2019-07-12 MED ORDER — MORPHINE SULFATE ER 60 MG PO TBCR: 60.0000 mg | EXTENDED_RELEASE_TABLET | Freq: Two times a day (BID) | ORAL | 0 refills | Status: DC

## 2019-07-12 NOTE — Progress Notes (Signed)
refilled 

## 2019-07-30 ENCOUNTER — Telehealth: Payer: Self-pay
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
## 2019-07-30 NOTE — Telephone Encounter (Signed)
Either way, patient needs to bring a police report. Thanks

## 2019-08-01 ENCOUNTER — Ambulatory Visit: Payer: Self-pay | Admitting: Family Medicine

## 2019-08-02 ENCOUNTER — Telehealth: Payer: Self-pay | Admitting: Family Medicine

## 2019-08-02 ENCOUNTER — Other Ambulatory Visit: Payer: Self-pay | Admitting: Internal Medicine

## 2019-08-02 DIAGNOSIS — D571 Sickle-cell disease without crisis: Secondary | ICD-10-CM

## 2019-08-02 DIAGNOSIS — Z79891 Long term (current) use of opiate analgesic: Secondary | ICD-10-CM

## 2019-08-02 MED ORDER — OXYCODONE HCL 10 MG PO TABS: 10.0000 mg | ORAL_TABLET | Freq: Four times a day (QID) | ORAL | 0 refills | Status: DC | PRN

## 2019-08-02 NOTE — Telephone Encounter (Signed)
Refilled

## 2019-08-02 NOTE — Telephone Encounter (Signed)
Refill request for oxycodone. Please advise.  

## 2019-08-10 ENCOUNTER — Telehealth: Payer: Self-pay | Admitting: Internal Medicine

## 2019-08-10 ENCOUNTER — Other Ambulatory Visit: Payer: Self-pay

## 2019-08-10 ENCOUNTER — Encounter: Payer: Self-pay | Admitting: Family Medicine

## 2019-08-10 ENCOUNTER — Ambulatory Visit (INDEPENDENT_AMBULATORY_CARE_PROVIDER_SITE_OTHER): Payer: Medicaid Other | Admitting: Family Medicine

## 2019-08-10 VITALS — BP 105/47 | HR 80 | Temp 98.9°F | Ht 63.0 in | Wt 169.8 lb

## 2019-08-10 DIAGNOSIS — D5700 Hb-SS disease with crisis, unspecified: Secondary | ICD-10-CM

## 2019-08-10 DIAGNOSIS — Z79891 Long term (current) use of opiate analgesic: Secondary | ICD-10-CM

## 2019-08-10 DIAGNOSIS — Z09 Encounter for follow-up examination after completed treatment for conditions other than malignant neoplasm: Secondary | ICD-10-CM | POA: Diagnosis not present

## 2019-08-10 DIAGNOSIS — D571 Sickle-cell disease without crisis: Secondary | ICD-10-CM | POA: Diagnosis not present

## 2019-08-10 LAB — POCT URINALYSIS DIPSTICK
Bilirubin, UA: NEGATIVE
Glucose, UA: NEGATIVE
Ketones, UA: NEGATIVE
Leukocytes, UA: NEGATIVE
Nitrite, UA: NEGATIVE
Protein, UA: NEGATIVE
Spec Grav, UA: 1.02 (ref 1.010–1.025)
Urobilinogen, UA: 1 E.U./dL
pH, UA: 7.5 (ref 5.0–8.0)

## 2019-08-10 MED ORDER — MORPHINE SULFATE ER 60 MG PO TBCR: 60.0000 mg | EXTENDED_RELEASE_TABLET | Freq: Two times a day (BID) | ORAL | 0 refills | Status: DC

## 2019-08-10 NOTE — Progress Notes (Signed)
Patient [facility]  Established Patient Office Visit  Subjective:  Patient ID: Carmen [name], female    DOB: [birth year]  Age: 28 y.o. MRN: [id number]  CC:  Chief Complaint  Patient presents with  . 3 Month follow up    Sickle Cell  . Depression    after giving birth 6 months ago    HPI Carmen [name] is a 28 year old female who presents for follow up today.   Past Medical History:  Diagnosis Date  . Headache(784.0)   . Sickle cell crisis ([place])   . Syphilis 2015   Was diagnosed and received one injection of antibiotics  . Thrombocytosis ([place]) 11/22/2014     CBC Latest Ref Rng & Units 12/13/2018 12/11/2018 12/10/2018 WBC 4.0 - 10.5 K/uL 14.8(H) 13.2(H) 15.9(H) Hemoglobin 12.0 - 15.0 g/dL 8.2(L) 7.7(L) 8.1(L) Hematocrit 36.0 - 46.0 % 23.7(L) 23.2(L) 24.5(L) Platelets 150 - 400 K/uL 326 399 388     Current Status: This will be [name] initial office visit with me. She was previously seeing Lanae Boast, NP for her PCP needs. Since her last office visit, she has had an ED visit on 05/18/2019 is doing well with no complaints. She states that she has pain in her back and legs. She rates her pain today at 3-4/10. She has not had a hospital visit for Sickle Cell Crisis since 05/18/2019 where she was treated and discharged the same day. She is currently taking all medications as prescribed and staying well hydrated. She reports occasional nausea, constipation, dizziness and headaches.      She denies fevers, chills, recent infections, weight loss, and night sweats. She has not had any visual changes, and falls. No chest pain, heart palpitations, cough and shortness of breath reported. No reports of GI problems such as vomiting, and diarrhea. She has no reports of blood in stools, dysuria and hematuria. No depression or anxiety reported today.  Past Surgical History:  Procedure Laterality Date  . CESAREAN SECTION N/A 02/05/2019   Procedure: CESAREAN  SECTION;  Surgeon: [name], MD;  Location: [facility];  Service: Obstetrics;  Laterality: N/A;  . CHOLECYSTECTOMY N/A 11/30/2014   Procedure: LAPAROSCOPIC CHOLECYSTECTOMY SINGLE SITE WITH INTRAOPERATIVE CHOLANGIOGRAM;  Surgeon: Michael Boston, MD;  Location: [facility] ORS;  Service: General;  Laterality: N/A;  . SPLENECTOMY      Family History  Problem Relation Age of Onset  . Hypertension Mother   . Sickle cell anemia Sister   . Kidney disease Sister        Lupus  . Arthritis Sister   . Sickle cell anemia Sister   . Sickle cell trait Sister   . Heart disease Maternal Aunt        CABG  . Heart disease Maternal Uncle        CABG  . Lupus Sister     Social History   Socioeconomic History  . Marital status: Single    Spouse name: Not on file  . Number of children: Not on file  . Years of education: Not on file  . Highest education level: Not on file  Occupational History  . Not on file  Social Needs  . Financial resource strain: Not on file  . Food insecurity    Worry: Sometimes true    Inability: Sometimes true  . Transportation needs    Medical: Yes    Non-medical: Yes  Tobacco Use  . Smoking status: Current  Some Day Smoker    Types: Cigarettes  . Smokeless tobacco: Never Used  Substance and Sexual Activity  . Alcohol use: No  . Drug use: No  . Sexual activity: Yes    Birth control/protection: Injection  Lifestyle  . Physical activity    Days per week: Not on file    Minutes per session: Not on file  . Stress: Not on file  Relationships  . Social Herbalist on phone: Not on file    Gets together: Not on file    Attends religious service: Not on file    Active member of club or organization: Not on file    Attends meetings of clubs or organizations: Not on file    Relationship status: Not on file  . Intimate partner violence    Fear of current or ex partner: Not on file    Emotionally abused: Not on file    Physically abused: Not on file     Forced sexual activity: Not on file  Other Topics Concern  . Not on file  Social History Narrative  . Not on file    Outpatient Medications Prior to Visit  Medication Sig Dispense Refill  . aspirin EC 81 MG tablet Take 1 tablet (81 mg total) by mouth daily. 90 tablet 3  . ergocalciferol (VITAMIN D2) 50000 units capsule Take 1 capsule (50,000 Units total) by mouth once a week. 30 capsule 1  . folic acid (FOLVITE) 1 MG tablet Take 1 tablet (1 mg total) by mouth daily. 90 tablet 3  . ibuprofen (ADVIL) 800 MG tablet Take 1 tablet (800 mg total) by mouth 3 (three) times daily with meals as needed. (Patient taking differently: Take 800 mg by mouth 3 (three) times daily with meals as needed for moderate pain. ) 30 tablet 1  . Prenatal Vit-Fe Fumarate-FA (PREPLUS) 27-1 MG TABS Take 1 tablet by mouth daily. 30 tablet 13  . morphine (MS CONTIN) 60 MG 12 hr tablet Take 1 tablet (60 mg total) by mouth every 12 (twelve) hours. 60 tablet 0  . Oxycodone HCl 10 MG TABS Take 1 tablet (10 mg total) by mouth every 6 (six) hours as needed for up to 15 days (PAIN). 60 tablet 0   No facility-administered medications prior to visit.     No Known Allergies  ROS Review of Systems  Constitutional: Positive for fatigue.  HENT: Negative.   Eyes: Negative.   Respiratory: Negative.   Cardiovascular: Negative.   Gastrointestinal: Negative.   Endocrine: Negative.   Musculoskeletal: Positive for arthralgias (generalized chronic joint pain).  Skin: Negative.   Allergic/Immunologic: Negative.   Neurological: Positive for dizziness and headaches.  Hematological: Negative.   Psychiatric/Behavioral: Negative.       Objective:    Physical Exam  Constitutional: She is oriented to person, place, and time. She appears well-developed and well-nourished.  HENT:  Head: Normocephalic and atraumatic.  Eyes: Conjunctivae are normal.  Neck: Normal range of motion. Neck supple.  Cardiovascular: Normal rate, regular  rhythm, normal heart sounds and intact distal pulses.  Pulmonary/Chest: Effort normal and breath sounds normal.  Abdominal: Soft.  Musculoskeletal: Normal range of motion.  Neurological: She is alert and oriented to person, place, and time. She has normal reflexes.  Skin: Skin is warm and dry.  Psychiatric: She has a normal mood and affect. Her behavior is normal. Judgment and thought content normal.  Nursing note and vitals reviewed.   BP (!) 105/47 (BP Location:  Left Arm, Patient Position: Sitting, Cuff Size: Normal)   Pulse 80   Temp 98.9 F (37.2 C) (Oral)   Ht 5\' 3"  (1.6 m)   Wt 169 lb 12.8 oz (77 kg)   SpO2 98%   BMI 30.08 kg/m  Wt Readings from Last 3 Encounters:  08/10/19 169 lb 12.8 oz (77 kg)  06/15/19 164 lb (74.4 kg)  05/18/19 160 lb (72.6 kg)     Health Maintenance Due  Topic Date Due  . INFLUENZA VACCINE  06/02/2019    There are no preventive care reminders to display for this patient.  No results found for: TSH Lab Results  Component Value Date   WBC 7.5 06/15/2019   HGB 9.8 (L) 06/15/2019   HCT 29.5 (L) 06/15/2019   MCV 86 06/15/2019   PLT 425 06/15/2019   Lab Results  Component Value Date   NA 140 05/18/2019   K 3.8 05/18/2019   CO2 19 (L) 05/18/2019   GLUCOSE 88 05/18/2019   BUN 11 05/18/2019   CREATININE 0.74 05/18/2019   BILITOT 1.6 (H) 05/18/2019   ALKPHOS 49 05/18/2019   AST 28 05/18/2019   ALT 26 05/18/2019   PROT 8.0 05/18/2019   ALBUMIN 4.3 05/18/2019   CALCIUM 9.6 05/18/2019   ANIONGAP 10 05/18/2019   No results found for: CHOL No results found for: HDL No results found for: LDLCALC No results found for: TRIG No results found for: CHOLHDL No results found for: HGBA1C    Assessment & Plan:   1. Hb-SS disease without crisis [facility]) She is doing well today. She will continue to take pain medications as prescribed; will continue to avoid extreme heat and cold; will continue to eat a healthy diet and drink at least 64 ounces of  water daily; continue stool softener as needed; will avoid colds and flu; will continue to get plenty of sleep and rest; will continue to avoid high stressful situations and remain infection free; will continue Folic Acid 1 mg daily to avoid sickle cell crisis. Continue to follow up with Hematologist as needed.  - morphine (MS CONTIN) 60 MG 12 hr tablet; Take 1 tablet (60 mg total) by mouth every 12 (twelve) hours.  Dispense: 60 tablet; Refill: 0  2. Chronic prescription opiate use - morphine (MS CONTIN) 60 MG 12 hr tablet; Take 1 tablet (60 mg total) by mouth every 12 (twelve) hours.  Dispense: 60 tablet; Refill: 0  3. Follow up She will follow up in 2 months.   Meds ordered this encounter  Medications  . morphine (MS CONTIN) 60 MG 12 hr tablet    Sig: Take 1 tablet (60 mg total) by mouth every 12 (twelve) hours.    Dispense:  60 tablet    Refill:  0    Order Specific Question:   Supervising Provider    Answer:   Tresa Garter [id number]    No orders of the defined types were placed in this encounter.   Referral Orders  No referral(s) requested today    Kathe Becton,  MSN, FNP-BC [place], [place] [phone] fax  Problem List Items Addressed This Visit      Other   Chronic prescription opiate use   Relevant Medications   morphine (MS CONTIN) 60 MG 12 hr tablet   Hb-SS disease without crisis ([place]) (Chronic)   Relevant Medications   morphine (MS CONTIN) 60  MG 12 hr tablet    Other Visit Diagnoses    Follow up    -  Primary      Meds ordered this encounter  Medications  . morphine (MS CONTIN) 60 MG 12 hr tablet    Sig: Take 1 tablet (60 mg total) by mouth every 12 (twelve) hours.    Dispense:  60 tablet    Refill:  0    Order Specific Question:   Supervising Provider    Answer:   Tresa Garter [id number]    Follow-up: Return in  about 2 months (around 10/10/2019).    Azzie Glatter, FNP

## 2019-08-13 ENCOUNTER — Ambulatory Visit: Payer: Medicaid Other

## 2019-08-13 ENCOUNTER — Other Ambulatory Visit: Payer: Self-pay | Admitting: Internal Medicine

## 2019-08-13 DIAGNOSIS — Z79891 Long term (current) use of opiate analgesic: Secondary | ICD-10-CM

## 2019-08-13 DIAGNOSIS — D571 Sickle-cell disease without crisis: Secondary | ICD-10-CM

## 2019-08-13 NOTE — Telephone Encounter (Signed)
Sent to DR

## 2019-08-24 ENCOUNTER — Other Ambulatory Visit: Payer: Self-pay | Admitting: Internal Medicine

## 2019-08-24 ENCOUNTER — Telehealth: Payer: Self-pay | Admitting: Internal Medicine

## 2019-08-24 DIAGNOSIS — Z79891 Long term (current) use of opiate analgesic: Secondary | ICD-10-CM

## 2019-08-24 DIAGNOSIS — D571 Sickle-cell disease without crisis: Secondary | ICD-10-CM

## 2019-08-24 MED ORDER — OXYCODONE HCL 10 MG PO TABS: 10.0000 mg | ORAL_TABLET | Freq: Four times a day (QID) | ORAL | 0 refills | Status: DC | PRN

## 2019-08-24 NOTE — Telephone Encounter (Signed)
Refilled

## 2019-09-03 ENCOUNTER — Telehealth: Payer: Self-pay | Admitting: Internal Medicine

## 2019-09-04 ENCOUNTER — Other Ambulatory Visit: Payer: Self-pay | Admitting: Internal Medicine

## 2019-09-04 NOTE — Telephone Encounter (Signed)
Due on 11/8

## 2019-09-08 ENCOUNTER — Emergency Department (HOSPITAL_COMMUNITY)
Admission: EM | Admit: 2019-09-08 | Discharge: 2019-09-08 | Disposition: A | Discharge status: Home / Self Care | Payer: Medicaid Other | Attending: Emergency Medicine | Admitting: Emergency Medicine

## 2019-09-08 ENCOUNTER — Encounter (HOSPITAL_COMMUNITY): Payer: Self-pay | Admitting: Emergency Medicine

## 2019-09-08 DIAGNOSIS — F17210 Nicotine dependence, cigarettes, uncomplicated: Secondary | ICD-10-CM | POA: Insufficient documentation

## 2019-09-08 DIAGNOSIS — Z7982 Long term (current) use of aspirin: Secondary | ICD-10-CM | POA: Diagnosis not present

## 2019-09-08 DIAGNOSIS — Z79899 Other long term (current) drug therapy: Secondary | ICD-10-CM | POA: Insufficient documentation

## 2019-09-08 DIAGNOSIS — D5700 Hb-SS disease with crisis, unspecified: Secondary | ICD-10-CM

## 2019-09-08 LAB — COMPREHENSIVE METABOLIC PANEL
ALT: 26 U/L (ref 0–44)
AST: 27 U/L (ref 15–41)
Albumin: 4.8 g/dL (ref 3.5–5.0)
Alkaline Phosphatase: 53 U/L (ref 38–126)
Anion gap: 9 (ref 5–15)
BUN: 11 mg/dL (ref 6–20)
CO2: 22 mmol/L (ref 22–32)
Calcium: 9.8 mg/dL (ref 8.9–10.3)
Chloride: 108 mmol/L (ref 98–111)
Creatinine, Ser: 0.6 mg/dL (ref 0.44–1.00)
GFR calc Af Amer: >60 mL/min (ref >60)
GFR calc non Af Amer: >60 mL/min (ref >60)
Glucose, Bld: 84 mg/dL (ref 70–99)
Potassium: 3.7 mmol/L (ref 3.5–5.1)
Sodium: 139 mmol/L (ref 135–145)
Total Bilirubin: 1.5 mg/dL — ABNORMAL HIGH (ref 0.3–1.2)
Total Protein: 8.8 g/dL — ABNORMAL HIGH (ref 6.5–8.1)

## 2019-09-08 LAB — CBC WITH DIFFERENTIAL/PLATELET
Abs Immature Granulocytes: 0.02 10*3/uL (ref 0.00–0.07)
Basophils Absolute: 0 10*3/uL (ref 0.0–0.1)
Basophils Relative: 1 %
Eosinophils Absolute: 0.1 10*3/uL (ref 0.0–0.5)
Eosinophils Relative: 1 %
HCT: 30 % — ABNORMAL LOW (ref 36.0–46.0)
Hemoglobin: 10.6 g/dL — ABNORMAL LOW (ref 12.0–15.0)
Immature Granulocytes: 0 %
Lymphocytes Relative: 36 %
Lymphs Abs: 2.6 10*3/uL (ref 0.7–4.0)
MCH: 29.1 pg (ref 26.0–34.0)
MCHC: 35.3 g/dL (ref 30.0–36.0)
MCV: 82.4 fL (ref 80.0–100.0)
Monocytes Absolute: 0.8 10*3/uL (ref 0.1–1.0)
Monocytes Relative: 11 %
Neutro Abs: 3.7 10*3/uL (ref 1.7–7.7)
Neutrophils Relative %: 51 %
Platelets: 491 10*3/uL — ABNORMAL HIGH (ref 150–400)
RBC: 3.64 MIL/uL — ABNORMAL LOW (ref 3.87–5.11)
RDW: 13.1 % (ref 11.5–15.5)
WBC: 7.2 10*3/uL (ref 4.0–10.5)
nRBC: 0 % (ref 0.0–0.2)

## 2019-09-08 LAB — RETICULOCYTES
Immature Retic Fract: 14.7 % (ref 2.3–15.9)
RBC.: 3.64 MIL/uL — ABNORMAL LOW (ref 3.87–5.11)
Retic Count, Absolute: 71 10*3/uL (ref 19.0–186.0)
Retic Ct Pct: 2 % (ref 0.4–3.1)

## 2019-09-08 LAB — I-STAT BETA HCG BLOOD, ED (MC, WL, AP ONLY): I-stat hCG, quantitative: <5 m[IU]/mL (ref <5)

## 2019-09-08 MED ORDER — SODIUM CHLORIDE 0.9 % IV BOLUS
1000.0000 mL | Freq: Once | INTRAVENOUS | Status: AC
  Administered 2019-09-08: 1000 mL via INTRAVENOUS

## 2019-09-08 MED ORDER — HYDROMORPHONE HCL 2 MG/ML IJ SOLN
2.0000 mg | INTRAMUSCULAR | Status: AC
  Administered 2019-09-08: 2 mg via INTRAVENOUS
  Filled 2019-09-08: qty 1

## 2019-09-08 MED ORDER — ONDANSETRON HCL 4 MG/2ML IJ SOLN
4.0000 mg | INTRAMUSCULAR | Status: DC | PRN
  Administered 2019-09-08: 4 mg via INTRAVENOUS
  Filled 2019-09-08: qty 2

## 2019-09-08 MED ORDER — HYDROMORPHONE HCL 2 MG/ML IJ SOLN: 2.0000 mg | INTRAMUSCULAR | Status: AC

## 2019-09-08 MED ORDER — KETOROLAC TROMETHAMINE 30 MG/ML IJ SOLN
30.0000 mg | INTRAMUSCULAR | Status: AC
  Administered 2019-09-08: 30 mg via INTRAVENOUS
  Filled 2019-09-08: qty 1

## 2019-09-08 MED ORDER — HYDROMORPHONE HCL 2 MG/ML IJ SOLN: 2.0000 mg | INTRAMUSCULAR | Status: DC

## 2019-09-08 NOTE — ED Notes (Signed)
Pt requesting [place] IV or IV team due to being a hard stick

## 2019-09-08 NOTE — ED Triage Notes (Signed)
Per EMS, pain all over due to sickle cell-states she has an appointment 12/1-symptoms have been going on for a week

## 2019-09-08 NOTE — ED Provider Notes (Signed)
[facility] DEPT Provider Note   CSN: [id number] Arrival date & time: 09/08/19  1104     History   Chief Complaint Chief Complaint  Patient presents with  . Sickle Cell Pain Crisis    HPI Carmen [name] is a 28 y.o. female hx of sickle cell here presenting with back and leg pain.  Patient states that for the last week or so she has been having back and leg pain.  She states that she typically gets sickle cell crisis when the weather gets colder.  She denies any chest pain or shortness of breath or fever.  She states that she works from home and denies any sick contact.  She is taking morphine and oxycodone at baseline and did take them today and states that did not help.  She called the sickle cell center and will get her refill tomorrow.      The history is provided by the patient.    Past Medical History:  Diagnosis Date  . Headache(784.0)   . Sickle cell crisis ([place])   . Syphilis 2015   Was diagnosed and received one injection of antibiotics  . Thrombocytosis ([place]) 11/22/2014     CBC Latest Ref Rng & Units 12/13/2018 12/11/2018 12/10/2018 WBC 4.0 - 10.5 K/uL 14.8(H) 13.2(H) 15.9(H) Hemoglobin 12.0 - 15.0 g/dL 8.2(L) 7.7(L) 8.1(L) Hematocrit 36.0 - 46.0 % 23.7(L) 23.2(L) 24.5(L) Platelets 150 - 400 K/uL 326 399 388      Patient Active Problem List   Diagnosis Date Noted  . Sickle cell disease ([place]) 12/04/2018  . Alpha thalassemia silent carrier 12/04/2018  . Chronic prescription opiate use 03/13/2018  . Chronic musculoskeletal pain 03/13/2018  . Vitamin D deficiency 01/13/2018  . Leukocytosis 08/21/2017  . 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[place]) 04/05/2017  . 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 Anemia of chronic disease   . Systolic ejection murmur [id number]  . Sickle cell anemia of mother during pregnancy [facility]) 05/16/2014    Past Surgical History:  Procedure Laterality Date  . CESAREAN SECTION N/A 02/05/2019   Procedure:  CESAREAN SECTION;  Surgeon: [name], MD;  Location: [facility];  Service: Obstetrics;  Laterality: N/A;  . CHOLECYSTECTOMY N/A 11/30/2014   Procedure: LAPAROSCOPIC CHOLECYSTECTOMY SINGLE SITE WITH INTRAOPERATIVE CHOLANGIOGRAM;  Surgeon: Michael Boston, MD;  Location: [facility] ORS;  Service: General;  Laterality: N/A;  . SPLENECTOMY       OB History    Gravida  2   Para  1   Term  1   Preterm      AB  1   Living  1     SAB      TAB  1   Ectopic      Multiple  0   Live Births  1            Home Medications    Prior to Admission medications   Medication Sig Start Date End Date Taking? Authorizing Provider  aspirin EC 81 MG tablet Take 1 tablet (81 mg total) by mouth daily. 03/15/19  Yes Lanae Boast, FNP  ergocalciferol (VITAMIN D2) 50000 units capsule Take 1 capsule (50,000 Units total) by mouth once a week. 05/22/18  Yes Tresa Garter, MD  folic acid (FOLVITE) 1 MG tablet Take 1 tablet (1 mg total) by mouth daily. 03/15/19  Yes Lanae Boast, FNP  morphine (MS CONTIN) 60 MG 12 hr tablet Take 1 tablet (60 mg total) by mouth  every 12 (twelve) hours. 08/10/19 09/09/19 Yes Azzie Glatter, FNP  Oxycodone HCl 10 MG TABS Take 1 tablet (10 mg total) by mouth every 6 (six) hours as needed for up to 15 days (PAIN). 08/24/19 09/08/19 Yes [name], Marlena Clipper, MD  Prenatal Vit-Fe Fumarate-FA (PREPLUS) 27-1 MG TABS Take 1 tablet by mouth daily. 08/17/18  Yes [name], MD  ibuprofen (ADVIL) 800 MG tablet Take 1 tablet (800 mg total) by mouth 3 (three) times daily with meals as needed. Patient not taking: Reported on 09/08/2019 03/15/19   Lanae Boast, FNP    Family History Family History  Problem Relation Age of Onset  . Hypertension Mother   . Sickle cell anemia Sister   . Kidney disease Sister        Lupus  . Arthritis Sister   . Sickle cell anemia Sister   . Sickle cell trait Sister   . Heart disease Maternal Aunt        CABG  . Heart disease Maternal  Uncle        CABG  . Lupus Sister     Social History Social History   Tobacco Use  . Smoking status: Current Some Day Smoker    Types: Cigarettes  . Smokeless tobacco: Never Used  Substance Use Topics  . Alcohol use: No  . Drug use: No     Allergies   Patient has no known allergies.   Review of Systems Review of Systems  Musculoskeletal: Positive for back pain.       Leg pain   All other systems reviewed and are negative.    Physical Exam Updated Vital Signs BP 104/63   Pulse 64   Temp 99.6 F (37.6 C) (Oral)   Resp 18   SpO2 100%   Physical Exam Vitals signs and nursing note reviewed.  HENT:     Head: Normocephalic.     Nose: Nose normal.     Mouth/Throat:     Mouth: Mucous membranes are moist.  Eyes:     Extraocular Movements: Extraocular movements intact.     Pupils: Pupils are equal, round, and reactive to light.  Neck:     Musculoskeletal: Normal range of motion.  Cardiovascular:     Rate and Rhythm: Normal rate and regular rhythm.     Pulses: Normal pulses.     Heart sounds: Normal heart sounds.  Pulmonary:     Effort: Pulmonary effort is normal.     Breath sounds: Normal breath sounds.  Abdominal:     General: Abdomen is flat.     Palpations: Abdomen is soft.  Musculoskeletal: Normal range of motion.     Comments: No midline tenderness or bony tenderness   Skin:    General: Skin is warm.     Capillary Refill: Capillary refill takes less than 2 seconds.  Neurological:     General: No focal deficit present.     Mental Status: She is alert.  Psychiatric:        Mood and Affect: Mood normal.        Behavior: Behavior normal.      ED Treatments / Results  Labs (all labs ordered are listed, but only abnormal results are displayed) Labs Reviewed  COMPREHENSIVE METABOLIC PANEL - Abnormal; Notable for the following components:      Result Value   Total Protein 8.8 (*)    Total Bilirubin 1.5 (*)    All other components within normal  limits  CBC  WITH DIFFERENTIAL/PLATELET - Abnormal; Notable for the following components:   RBC 3.64 (*)    Hemoglobin 10.6 (*)    HCT 30.0 (*)    Platelets 491 (*)    All other components within normal limits  RETICULOCYTES - Abnormal; Notable for the following components:   RBC. 3.64 (*)    All other components within normal limits  I-STAT BETA HCG BLOOD, ED ([facility], [facility], [facility] ONLY)    EKG None  Radiology No results found.  Procedures Procedures (including critical care time)  Medications Ordered in ED Medications  HYDROmorphone (DILAUDID) injection 2 mg (has no administration in time range)    Or  HYDROmorphone (DILAUDID) injection 2 mg (has no administration in time range)  ondansetron (ZOFRAN) injection 4 mg (4 mg Intravenous Given 09/08/19 1736)  ketorolac (TORADOL) 30 MG/ML injection 30 mg (30 mg Intravenous Given 09/08/19 1653)  HYDROmorphone (DILAUDID) injection 2 mg (2 mg Intravenous Given 09/08/19 1653)    Or  HYDROmorphone (DILAUDID) injection 2 mg ( Subcutaneous See Alternative 09/08/19 1653)  HYDROmorphone (DILAUDID) injection 2 mg (2 mg Intravenous Given 09/08/19 1724)    Or  HYDROmorphone (DILAUDID) injection 2 mg ( Subcutaneous See Alternative 09/08/19 1724)  HYDROmorphone (DILAUDID) injection 2 mg (2 mg Intravenous Given 09/08/19 1819)    Or  HYDROmorphone (DILAUDID) injection 2 mg ( Subcutaneous See Alternative 09/08/19 1819)  sodium chloride 0.9 % bolus 1,000 mL (1,000 mLs Intravenous New Bag/Given (Non-Interop) 09/08/19 1735)     Initial Impression / Assessment and Plan / ED Course  I have reviewed the triage vital signs and the nursing notes.  Pertinent labs & imaging results that were available during my care of the patient were reviewed by me and considered in my medical decision making (see chart for details).       Carmen [name] is a 28 y.o. female hx of sickle cell here with back and leg pain. Likely sickle cell pain crisis. Will get labs, reticulocyte  count. Will give pain meds per sickle cell order set.   6:45 PM Labs unremarkable. Reticulocyte normal. Stable for discharge.   Final Clinical Impressions(s) / ED Diagnoses   Final diagnoses:  None    ED Discharge Orders    None       Drenda Freeze, MD 09/08/19 [phone]

## 2019-09-08 NOTE — ED Notes (Signed)
Unable to collect labs patient wants to wait until she goes to the back

## 2019-09-08 NOTE — Discharge Instructions (Signed)
Continue your current meds and refill your pain meds as prescribed by your doctor  See your doctor  Return to ER if you have worse back pain, leg pain, shortness of breath, fever.

## 2019-09-09 ENCOUNTER — Emergency Department (HOSPITAL_COMMUNITY)
Admission: EM | Admit: 2019-09-09 | Discharge: 2019-09-09 | Disposition: A | Discharge status: Home / Self Care | Payer: Medicaid Other | Attending: Emergency Medicine | Admitting: Emergency Medicine

## 2019-09-09 ENCOUNTER — Emergency Department (HOSPITAL_COMMUNITY): Payer: Medicaid Other

## 2019-09-09 ENCOUNTER — Encounter (HOSPITAL_COMMUNITY): Payer: Self-pay

## 2019-09-09 ENCOUNTER — Other Ambulatory Visit: Payer: Self-pay

## 2019-09-09 DIAGNOSIS — M79602 Pain in left arm: Secondary | ICD-10-CM | POA: Diagnosis not present

## 2019-09-09 DIAGNOSIS — F17210 Nicotine dependence, cigarettes, uncomplicated: Secondary | ICD-10-CM | POA: Diagnosis not present

## 2019-09-09 DIAGNOSIS — M79605 Pain in left leg: Secondary | ICD-10-CM | POA: Insufficient documentation

## 2019-09-09 DIAGNOSIS — M549 Dorsalgia, unspecified: Secondary | ICD-10-CM | POA: Diagnosis not present

## 2019-09-09 DIAGNOSIS — M79604 Pain in right leg: Secondary | ICD-10-CM | POA: Insufficient documentation

## 2019-09-09 DIAGNOSIS — D5700 Hb-SS disease with crisis, unspecified: Secondary | ICD-10-CM | POA: Diagnosis present

## 2019-09-09 DIAGNOSIS — R079 Chest pain, unspecified: Secondary | ICD-10-CM | POA: Diagnosis not present

## 2019-09-09 DIAGNOSIS — Z79899 Other long term (current) drug therapy: Secondary | ICD-10-CM | POA: Diagnosis not present

## 2019-09-09 DIAGNOSIS — M79601 Pain in right arm: Secondary | ICD-10-CM | POA: Diagnosis not present

## 2019-09-09 DIAGNOSIS — Z7982 Long term (current) use of aspirin: Secondary | ICD-10-CM | POA: Insufficient documentation

## 2019-09-09 LAB — COMPREHENSIVE METABOLIC PANEL
ALT: 21 U/L (ref 0–44)
AST: 24 U/L (ref 15–41)
Albumin: 4.5 g/dL (ref 3.5–5.0)
Alkaline Phosphatase: 54 U/L (ref 38–126)
Anion gap: 9 (ref 5–15)
BUN: 17 mg/dL (ref 6–20)
CO2: 26 mmol/L (ref 22–32)
Calcium: 8.9 mg/dL (ref 8.9–10.3)
Chloride: 107 mmol/L (ref 98–111)
Creatinine, Ser: 0.7 mg/dL (ref 0.44–1.00)
GFR calc Af Amer: >60 mL/min (ref >60)
GFR calc non Af Amer: >60 mL/min (ref >60)
Glucose, Bld: 93 mg/dL (ref 70–99)
Potassium: 3.7 mmol/L (ref 3.5–5.1)
Sodium: 142 mmol/L (ref 135–145)
Total Bilirubin: 1.7 mg/dL — ABNORMAL HIGH (ref 0.3–1.2)
Total Protein: 8.2 g/dL — ABNORMAL HIGH (ref 6.5–8.1)

## 2019-09-09 LAB — CBC WITH DIFFERENTIAL/PLATELET
Abs Immature Granulocytes: 0.02 10*3/uL (ref 0.00–0.07)
Basophils Absolute: 0 10*3/uL (ref 0.0–0.1)
Basophils Relative: 1 %
Eosinophils Absolute: 0.2 10*3/uL (ref 0.0–0.5)
Eosinophils Relative: 2 %
HCT: 29.4 % — ABNORMAL LOW (ref 36.0–46.0)
Hemoglobin: 10.4 g/dL — ABNORMAL LOW (ref 12.0–15.0)
Immature Granulocytes: 0 %
Lymphocytes Relative: 30 %
Lymphs Abs: 2.2 10*3/uL (ref 0.7–4.0)
MCH: 29.3 pg (ref 26.0–34.0)
MCHC: 35.4 g/dL (ref 30.0–36.0)
MCV: 82.8 fL (ref 80.0–100.0)
Monocytes Absolute: 0.9 10*3/uL (ref 0.1–1.0)
Monocytes Relative: 12 %
Neutro Abs: 4 10*3/uL (ref 1.7–7.7)
Neutrophils Relative %: 55 %
Platelets: 459 10*3/uL — ABNORMAL HIGH (ref 150–400)
RBC: 3.55 MIL/uL — ABNORMAL LOW (ref 3.87–5.11)
RDW: 13 % (ref 11.5–15.5)
WBC: 7.3 10*3/uL (ref 4.0–10.5)
nRBC: 0 % (ref 0.0–0.2)

## 2019-09-09 LAB — URINALYSIS, ROUTINE W REFLEX MICROSCOPIC
Bacteria, UA: NONE SEEN
Bilirubin Urine: NEGATIVE
Glucose, UA: NEGATIVE mg/dL
Hgb urine dipstick: NEGATIVE
Ketones, ur: NEGATIVE mg/dL
Nitrite: NEGATIVE
Protein, ur: NEGATIVE mg/dL
Specific Gravity, Urine: 1.014 (ref 1.005–1.030)
pH: 6 (ref 5.0–8.0)

## 2019-09-09 LAB — PREGNANCY, URINE: Preg Test, Ur: NEGATIVE

## 2019-09-09 LAB — RETICULOCYTES
Immature Retic Fract: 15.6 % (ref 2.3–15.9)
RBC.: 3.55 MIL/uL — ABNORMAL LOW (ref 3.87–5.11)
Retic Count, Absolute: 69.2 10*3/uL (ref 19.0–186.0)
Retic Ct Pct: 2 % (ref 0.4–3.1)

## 2019-09-09 MED ORDER — KETOROLAC TROMETHAMINE 15 MG/ML IJ SOLN
15.0000 mg | INTRAMUSCULAR | Status: AC
  Administered 2019-09-09: 15 mg via INTRAVENOUS
  Filled 2019-09-09: qty 1

## 2019-09-09 MED ORDER — HYDROMORPHONE HCL 2 MG/ML IJ SOLN
2.0000 mg | Freq: Once | INTRAMUSCULAR | Status: AC
  Administered 2019-09-09: 2 mg via INTRAVENOUS
  Filled 2019-09-09: qty 1

## 2019-09-09 MED ORDER — HYDROMORPHONE HCL 2 MG/ML IJ SOLN: 2.0000 mg | INTRAMUSCULAR | Status: AC

## 2019-09-09 MED ORDER — HYDROMORPHONE HCL 2 MG/ML IJ SOLN
2.0000 mg | INTRAMUSCULAR | Status: AC
  Administered 2019-09-09: 2 mg via INTRAVENOUS
  Filled 2019-09-09: qty 1

## 2019-09-09 MED ORDER — ONDANSETRON HCL 4 MG/2ML IJ SOLN
4.0000 mg | INTRAMUSCULAR | Status: DC | PRN
  Administered 2019-09-09: 4 mg via INTRAVENOUS
  Filled 2019-09-09: qty 2

## 2019-09-09 MED ORDER — DEXTROSE-NACL 5-0.45 % IV SOLN
INTRAVENOUS | Status: DC
  Administered 2019-09-09: 08:00:00 via INTRAVENOUS

## 2019-09-09 MED ORDER — DIPHENHYDRAMINE HCL 25 MG PO CAPS
25.0000 mg | ORAL_CAPSULE | ORAL | Status: DC | PRN
  Administered 2019-09-09: 25 mg via ORAL
  Filled 2019-09-09: qty 1

## 2019-09-09 NOTE — ED Triage Notes (Addendum)
Patient arrived via [facility] from home.    Patient c/o sickle cell crisis, pain in back and extremities  Patient was seen yesterday for same.   Patient tearful in triage.   See note in chart from yesterday:  She states that she typically gets sickle cell crisis when the weather gets colder.  She denies any chest pain or shortness of breath or fever.  She states that she works from home and denies any sick contact.  She is taking morphine and oxycodone at baseline and did take them last night with no relief. Pain woke her up out of her sleep.   BP-134/80 P-94 98% RA RR-20 97.9 tympanic

## 2019-09-09 NOTE — ED Notes (Signed)
Patient transported to X-ray 

## 2019-09-09 NOTE — ED Notes (Signed)
Patient called for "itchiness".

## 2019-09-09 NOTE — ED Provider Notes (Signed)
[facility] DEPT Provider Note   CSN: [id number] Arrival date & time: 09/09/19  0715     History   Chief Complaint Chief Complaint  Patient presents with  . Sickle Cell Pain Crisis    Carmen [name] is a 28 y.o. female with sickle cell disease presents with sickle cell crisis.  Patient states that for the past week she has had gradually worsening pain in her mid back and extremities.  This is typical symptoms that she has had with prior pain crises.  She presented to the ED last night and after 3 rounds of pain medications he felt better and was discharged home.  When she got home she took her long-acting morphine states that she woke up in the middle of the night with severe pain.  The pain is in both arms and legs and her back.  She is also having mild sternal chest pain which she has had before as well.  She denies fever, chills, headache, shortness of breath, cough, abdominal pain, nausea, vomiting, diarrhea, urinary symptoms.  She states that she has not had an appetite, has not been sleeping well, and has not been hydrating.     Carmen  Past Medical History:  Diagnosis Date  . Headache(784.0)   . Sickle cell crisis ([place])   . Syphilis 2015   Was diagnosed and received one injection of antibiotics  . Thrombocytosis ([place]) 11/22/2014     CBC Latest Ref Rng & Units 12/13/2018 12/11/2018 12/10/2018 WBC 4.0 - 10.5 K/uL 14.8(H) 13.2(H) 15.9(H) Hemoglobin 12.0 - 15.0 g/dL 8.2(L) 7.7(L) 8.1(L) Hematocrit 36.0 - 46.0 % 23.7(L) 23.2(L) 24.5(L) Platelets 150 - 400 K/uL 326 399 388      Patient Active Problem List   Diagnosis Date Noted  . Sickle cell disease ([place]) 12/04/2018  . Alpha thalassemia silent carrier 12/04/2018  . Chronic prescription opiate use 03/13/2018  . Chronic musculoskeletal pain 03/13/2018  . Vitamin D deficiency 01/13/2018  . Leukocytosis 08/21/2017  . 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 Hb-SS disease without  crisis ([place]) 08/15/2016  . Anemia of chronic disease   . Systolic ejection murmur [id number]  . Sickle cell anemia of mother during pregnancy [facility]) 05/16/2014    Past Surgical History:  Procedure Laterality Date  . CESAREAN SECTION N/A 02/05/2019   Procedure: CESAREAN SECTION;  Surgeon: [name], MD;  Location: [facility] LD ORS;  Service: Obstetrics;  Laterality: N/A;  . CHOLECYSTECTOMY N/A 11/30/2014   Procedure: LAPAROSCOPIC CHOLECYSTECTOMY SINGLE SITE WITH INTRAOPERATIVE CHOLANGIOGRAM;  Surgeon: Michael Boston, MD;  Location: [facility] ORS;  Service: General;  Laterality: N/A;  . SPLENECTOMY       OB History    Gravida  2   Para  1   Term  1   Preterm      AB  1   Living  1     SAB      TAB  1   Ectopic      Multiple  0   Live Births  1            Home Medications    Prior to Admission medications   Medication Sig Start Date End Date Taking? Authorizing Provider  aspirin EC 81 MG tablet Take 1 tablet (81 mg total) by mouth daily. 03/15/19   Lanae Boast, FNP  ergocalciferol (VITAMIN D2) 50000 units capsule Take 1 capsule (50,000 Units total) by mouth once a week. 05/22/18   [name],  [name], MD  folic acid (FOLVITE) 1 MG tablet Take 1 tablet (1 mg total) by mouth daily. 03/15/19   Lanae Boast, FNP  ibuprofen (ADVIL) 800 MG tablet Take 1 tablet (800 mg total) by mouth 3 (three) times daily with meals as needed. Patient not taking: Reported on 09/08/2019 03/15/19   Lanae Boast, FNP  morphine (MS CONTIN) 60 MG 12 hr tablet Take 1 tablet (60 mg total) by mouth every 12 (twelve) hours. 08/10/19 09/09/19  Azzie Glatter, FNP  Prenatal Vit-Fe Fumarate-FA (PREPLUS) 27-1 MG TABS Take 1 tablet by mouth daily. 08/17/18   [name], MD    Family History Family History  Problem Relation Age of Onset  . Hypertension Mother   . Sickle cell anemia Sister   . Kidney disease Sister        Lupus  . Arthritis Sister   . Sickle cell anemia Sister   . Sickle cell  trait Sister   . Heart disease Maternal Aunt        CABG  . Heart disease Maternal Uncle        CABG  . Lupus Sister     Social History Social History   Tobacco Use  . Smoking status: Current Some Day Smoker    Types: Cigarettes  . Smokeless tobacco: Never Used  Substance Use Topics  . Alcohol use: No  . Drug use: No     Allergies   Patient has no known allergies.   Review of Systems Review of Systems  Constitutional: Positive for appetite change. Negative for chills and fever.  Respiratory: Negative for cough and shortness of breath.   Cardiovascular: Positive for chest pain.  Gastrointestinal: Negative for abdominal pain, diarrhea, nausea and vomiting.  Genitourinary: Negative for dysuria.  Musculoskeletal: Positive for arthralgias and back pain. Negative for joint swelling.  Allergic/Immunologic: Positive for immunocompromised state.  Neurological: Negative for headaches.  Psychiatric/Behavioral: Positive for sleep disturbance.  All other systems reviewed and are negative.    Physical Exam Updated Vital Signs BP 124/83 (BP Location: Left Arm)   Pulse 71   Temp 98.5 F (36.9 C)   Resp 20   SpO2 100%   Physical Exam Vitals signs and nursing note reviewed.  Constitutional:      General: She is not in acute distress.    Appearance: Normal appearance. She is well-developed. She is not ill-appearing.     Comments: Uncomfortable appearing.  Cooperative.  Tearful  HENT:     Head: Normocephalic and atraumatic.  Eyes:     General: No scleral icterus.       Right eye: No discharge.        Left eye: No discharge.     Conjunctiva/sclera: Conjunctivae normal.     Pupils: Pupils are equal, round, and reactive to light.  Neck:     Musculoskeletal: Normal range of motion.  Cardiovascular:     Rate and Rhythm: Normal rate and regular rhythm.  Pulmonary:     Effort: Pulmonary effort is normal. No respiratory distress.     Breath sounds: Normal breath sounds.   Abdominal:     General: There is no distension.     Palpations: Abdomen is soft.     Tenderness: There is no abdominal tenderness.  Musculoskeletal:     Comments: Moves all extremities normally. No focal tenderness, swelling, redness.  Skin:    General: Skin is warm and dry.  Neurological:     General: No focal deficit present.  Mental Status: She is alert and oriented to person, place, and time.  Psychiatric:        Mood and Affect: Mood normal.        Behavior: Behavior normal. Behavior is cooperative.      ED Treatments / Results  Labs (all labs ordered are listed, but only abnormal results are displayed) Labs Reviewed  CBC WITH DIFFERENTIAL/PLATELET - Abnormal; Notable for the following components:      Result Value   RBC 3.55 (*)    Hemoglobin 10.4 (*)    HCT 29.4 (*)    Platelets 459 (*)    All other components within normal limits  RETICULOCYTES - Abnormal; Notable for the following components:   RBC. 3.55 (*)    All other components within normal limits  COMPREHENSIVE METABOLIC PANEL - Abnormal; Notable for the following components:   Total Protein 8.2 (*)    Total Bilirubin 1.7 (*)    All other components within normal limits  URINALYSIS, ROUTINE W REFLEX MICROSCOPIC - Abnormal; Notable for the following components:   Leukocytes,Ua TRACE (*)    All other components within normal limits  PREGNANCY, URINE    EKG EKG Interpretation  Date/Time:  Sunday September 09 2019 08:38:53 EST Ventricular Rate:  68 PR Interval:    QRS Duration: 75 QT Interval:  400 QTC Calculation: 426 R Axis:   56 Text Interpretation: Sinus rhythm RSR' in V1 or V2, probably normal variant Confirmed by Lennice Sites [phone]) on 09/09/2019 9:00:23 AM   Radiology Dg Chest 2 View  Result Date: 09/09/2019 CLINICAL DATA:  Chest pain EXAM: CHEST - 2 VIEW COMPARISON:  Chest radiograph 02/09/2019 FINDINGS: Normal cardiac and mediastinal contours. No consolidative pulmonary opacities. No  pleural effusion or pneumothorax. Osseous structures unremarkable. IMPRESSION: No active cardiopulmonary disease. Electronically Signed   By: Lovey Newcomer M.D.   On: 09/09/2019 08:50    Procedures Procedures (including critical care time)  Medications Ordered in ED Medications  dextrose 5 %-0.45 % sodium chloride infusion ( Intravenous Stopped 09/09/19 1115)  diphenhydrAMINE (BENADRYL) capsule 25-50 mg (25 mg Oral Given 09/09/19 0903)  ondansetron (ZOFRAN) injection 4 mg (4 mg Intravenous Given 09/09/19 0803)  ketorolac (TORADOL) 15 MG/ML injection 15 mg (15 mg Intravenous Given 09/09/19 0800)  HYDROmorphone (DILAUDID) injection 2 mg (2 mg Intravenous Given 09/09/19 0801)    Or  HYDROmorphone (DILAUDID) injection 2 mg ( Subcutaneous See Alternative 09/09/19 0801)  HYDROmorphone (DILAUDID) injection 2 mg (2 mg Intravenous Given 09/09/19 0848)    Or  HYDROmorphone (DILAUDID) injection 2 mg ( Subcutaneous See Alternative 09/09/19 0848)  HYDROmorphone (DILAUDID) injection 2 mg (2 mg Intravenous Given 09/09/19 0955)    Or  HYDROmorphone (DILAUDID) injection 2 mg ( Subcutaneous See Alternative 09/09/19 0955)  HYDROmorphone (DILAUDID) injection 2 mg (2 mg Intravenous Given 09/09/19 1056)     Initial Impression / Assessment and Plan / ED Course  I have reviewed the triage vital signs and the nursing notes.  Pertinent labs & imaging results that were available during my care of the patient were reviewed by me and considered in my medical decision making (see chart for details).  28 year old female presents with acutely worsening pain in her extremities and back over the past week.  She has mild chest pain which started today.  She is seen in the ED yesterday and discharged after pain was controlled.  Her vital signs are normal here.  Will recheck labs, obtain EKG and chest x-ray and provide pain control,  fluids.  Repeat labs are similar and stable compared to yesterday.  Chest x-ray is negative.  EKG is  sinus rhythm.  Rechecked pt - after 3 doses she is feeling better. Pain is 6/10. She thinks she can go home and is requesting and additional dose. BP is low [id number] systolic. Will check manual pressure before giving additional dose.  Manual blood pressure is 107/60.  We will give additional dose and discharge. Encouraged rest and hydration  Final Clinical Impressions(s) / ED Diagnoses   Final diagnoses:  Sickle cell pain crisis [facility])    ED Discharge Orders    None       Recardo Evangelist, PA-C 09/09/19 [place], [place], DO 09/09/19 1244

## 2019-09-09 NOTE — Discharge Instructions (Signed)
Please continue home meds Rest and stay hydrated Please follow up with your doctor Return if worsening

## 2019-09-10 ENCOUNTER — Telehealth: Payer: Self-pay

## 2019-09-10 ENCOUNTER — Other Ambulatory Visit: Payer: Self-pay | Admitting: Family Medicine

## 2019-09-10 ENCOUNTER — Other Ambulatory Visit: Payer: Self-pay | Admitting: Internal Medicine

## 2019-09-10 ENCOUNTER — Non-Acute Institutional Stay (HOSPITAL_COMMUNITY)
Admission: AD | Admit: 2019-09-10 | Discharge: 2019-09-10 | Disposition: A | Discharge status: Home / Self Care | Payer: Medicaid Other | Source: Ambulatory Visit | Attending: Internal Medicine | Admitting: Internal Medicine

## 2019-09-10 DIAGNOSIS — Z7982 Long term (current) use of aspirin: Secondary | ICD-10-CM | POA: Diagnosis not present

## 2019-09-10 DIAGNOSIS — F17210 Nicotine dependence, cigarettes, uncomplicated: Secondary | ICD-10-CM | POA: Insufficient documentation

## 2019-09-10 DIAGNOSIS — D571 Sickle-cell disease without crisis: Secondary | ICD-10-CM

## 2019-09-10 DIAGNOSIS — G894 Chronic pain syndrome: Secondary | ICD-10-CM | POA: Insufficient documentation

## 2019-09-10 DIAGNOSIS — Z79891 Long term (current) use of opiate analgesic: Secondary | ICD-10-CM

## 2019-09-10 DIAGNOSIS — D5700 Hb-SS disease with crisis, unspecified: Secondary | ICD-10-CM | POA: Diagnosis present

## 2019-09-10 DIAGNOSIS — F1120 Opioid dependence, uncomplicated: Secondary | ICD-10-CM | POA: Diagnosis not present

## 2019-09-10 DIAGNOSIS — R52 Pain, unspecified: Secondary | ICD-10-CM | POA: Diagnosis present

## 2019-09-10 LAB — URINALYSIS, COMPLETE (UACMP) WITH MICROSCOPIC
Bilirubin Urine: NEGATIVE
Glucose, UA: NEGATIVE mg/dL
Hgb urine dipstick: NEGATIVE
Ketones, ur: NEGATIVE mg/dL
Leukocytes,Ua: NEGATIVE
Nitrite: NEGATIVE
Protein, ur: NEGATIVE mg/dL
Specific Gravity, Urine: 1.01 (ref 1.005–1.030)
pH: 7 (ref 5.0–8.0)

## 2019-09-10 LAB — CBC WITH DIFFERENTIAL/PLATELET
Abs Immature Granulocytes: 0.02 10*3/uL (ref 0.00–0.07)
Basophils Absolute: 0 10*3/uL (ref 0.0–0.1)
Basophils Relative: 1 %
Eosinophils Absolute: 0.2 10*3/uL (ref 0.0–0.5)
Eosinophils Relative: 4 %
HCT: 29.5 % — ABNORMAL LOW (ref 36.0–46.0)
Hemoglobin: 10.5 g/dL — ABNORMAL LOW (ref 12.0–15.0)
Immature Granulocytes: 0 %
Lymphocytes Relative: 46 %
Lymphs Abs: 2.7 10*3/uL (ref 0.7–4.0)
MCH: 29.4 pg (ref 26.0–34.0)
MCHC: 35.6 g/dL (ref 30.0–36.0)
MCV: 82.6 fL (ref 80.0–100.0)
Monocytes Absolute: 0.7 10*3/uL (ref 0.1–1.0)
Monocytes Relative: 13 %
Neutro Abs: 2.1 10*3/uL (ref 1.7–7.7)
Neutrophils Relative %: 36 %
Platelets: 462 10*3/uL — ABNORMAL HIGH (ref 150–400)
RBC: 3.57 MIL/uL — ABNORMAL LOW (ref 3.87–5.11)
RDW: 13.2 % (ref 11.5–15.5)
WBC: 5.8 10*3/uL (ref 4.0–10.5)
nRBC: 0 % (ref 0.0–0.2)

## 2019-09-10 LAB — COMPREHENSIVE METABOLIC PANEL
ALT: 22 U/L (ref 0–44)
AST: 23 U/L (ref 15–41)
Albumin: 4.5 g/dL (ref 3.5–5.0)
Alkaline Phosphatase: 53 U/L (ref 38–126)
Anion gap: 7 (ref 5–15)
BUN: 9 mg/dL (ref 6–20)
CO2: 23 mmol/L (ref 22–32)
Calcium: 9.4 mg/dL (ref 8.9–10.3)
Chloride: 111 mmol/L (ref 98–111)
Creatinine, Ser: 0.55 mg/dL (ref 0.44–1.00)
GFR calc Af Amer: >60 mL/min (ref >60)
GFR calc non Af Amer: >60 mL/min (ref >60)
Glucose, Bld: 95 mg/dL (ref 70–99)
Potassium: 3.9 mmol/L (ref 3.5–5.1)
Sodium: 141 mmol/L (ref 135–145)
Total Bilirubin: 1.3 mg/dL — ABNORMAL HIGH (ref 0.3–1.2)
Total Protein: 8.4 g/dL — ABNORMAL HIGH (ref 6.5–8.1)

## 2019-09-10 LAB — RETICULOCYTES
Immature Retic Fract: 20.4 % — ABNORMAL HIGH (ref 2.3–15.9)
RBC.: 3.57 MIL/uL — ABNORMAL LOW (ref 3.87–5.11)
Retic Count, Absolute: 72.8 10*3/uL (ref 19.0–186.0)
Retic Ct Pct: 2 % (ref 0.4–3.1)

## 2019-09-10 LAB — LACTATE DEHYDROGENASE: LDH: 203 U/L — ABNORMAL HIGH (ref 98–192)

## 2019-09-10 LAB — PREGNANCY, URINE: Preg Test, Ur: NEGATIVE

## 2019-09-10 MED ORDER — ONDANSETRON HCL 4 MG/2ML IJ SOLN: 4.0000 mg | Freq: Four times a day (QID) | INTRAMUSCULAR | Status: DC | PRN

## 2019-09-10 MED ORDER — DIPHENHYDRAMINE HCL 25 MG PO CAPS
25.0000 mg | ORAL_CAPSULE | ORAL | Status: DC | PRN
  Administered 2019-09-10: 13:00:00 25 mg via ORAL
  Filled 2019-09-10: qty 1

## 2019-09-10 MED ORDER — SODIUM CHLORIDE 0.9 % IV SOLN
25.0000 mg | INTRAVENOUS | Status: DC | PRN
  Filled 2019-09-10: qty 0.5

## 2019-09-10 MED ORDER — ACETAMINOPHEN 500 MG PO TABS
1000.0000 mg | ORAL_TABLET | Freq: Once | ORAL | Status: AC
  Administered 2019-09-10: 1000 mg via ORAL
  Filled 2019-09-10: qty 2

## 2019-09-10 MED ORDER — SODIUM CHLORIDE 0.45 % IV SOLN
INTRAVENOUS | Status: DC
  Administered 2019-09-10: 10:00:00 via INTRAVENOUS

## 2019-09-10 MED ORDER — HYDROMORPHONE 1 MG/ML IV SOLN
INTRAVENOUS | Status: DC
  Administered 2019-09-10: 30 mg via INTRAVENOUS
  Administered 2019-09-10: 12 mg via INTRAVENOUS
  Filled 2019-09-10: qty 30

## 2019-09-10 MED ORDER — KETOROLAC TROMETHAMINE 30 MG/ML IJ SOLN
15.0000 mg | Freq: Once | INTRAMUSCULAR | Status: AC
  Administered 2019-09-10: 15 mg via INTRAVENOUS
  Filled 2019-09-10: qty 1

## 2019-09-10 MED ORDER — MORPHINE SULFATE ER 60 MG PO TBCR: 60.0000 mg | EXTENDED_RELEASE_TABLET | Freq: Two times a day (BID) | ORAL | 0 refills | Status: DC

## 2019-09-10 MED ORDER — NALOXONE HCL 0.4 MG/ML IJ SOLN: 0.4000 mg | INTRAMUSCULAR | Status: DC | PRN

## 2019-09-10 MED ORDER — OXYCODONE HCL 10 MG PO TABS: 10.0000 mg | ORAL_TABLET | Freq: Four times a day (QID) | ORAL | 0 refills | Status: DC | PRN

## 2019-09-10 MED ORDER — SODIUM CHLORIDE 0.9% FLUSH: 9.0000 mL | INTRAVENOUS | Status: DC | PRN

## 2019-09-10 NOTE — H&P (Addendum)
[facility] History and Physical   Date: 09/10/2019  Patient name: Carmen [name] Medical record number: [id number] Date of birth: 03/09/1991 Age: 28 y.o. Gender: female PCP: Lanae Boast, [place]  Attending physician: Tresa Garter, MD  Chief Complaint: Sickle cell pain  History of Present Illness: Carmen [name], a 28 year old female with a medical history significant for sickle cell disease, chronic pain syndrome, opiate dependence, and tolerance presents complaining of generalized pain that is consistent with her typical pain crisis. She attributes current pain crisis to being out of home medications.  Patient was treated and evaluated in the emergency department on 09/09/2019 for this problem without complete resolution.  Current pain intensity is 9/10 characterized as constant and aching.  Patient also endorses pain to mid chest, that typically occurs during her sickle cell crisis.  She says that she has been home pain medications over the past several days.   Medications are due to be refilled on today.  Last medication was received in the emergency department on last night without sustained relief.  She denies fever, chills, sick contacts, recent travel, or exposure to COVID-19.  She also denies headache, dizziness, paresthesias, chest pain, sore throat, shortness of breath, persistent cough, dysuria, or nausea, vomiting, or diarrhea.  Meds: Medications Prior to Admission  Medication Sig Dispense Refill Last Dose  . aspirin EC 81 MG tablet Take 1 tablet (81 mg total) by mouth daily. 90 tablet 3   . ergocalciferol (VITAMIN D2) 50000 units capsule Take 1 capsule (50,000 Units total) by mouth once a week. 30 capsule 1   . folic acid (FOLVITE) 1 MG tablet Take 1 tablet (1 mg total) by mouth daily. 90 tablet 3   . ibuprofen (ADVIL) 800 MG tablet Take 1 tablet (800 mg total) by mouth 3 (three) times daily with meals as needed. (Patient not taking: Reported on 09/08/2019) 30  tablet 1   . Prenatal Vit-Fe Fumarate-FA (PREPLUS) 27-1 MG TABS Take 1 tablet by mouth daily. 30 tablet 13     Allergies: Patient has no known allergies. Past Medical History:  Diagnosis Date  . Headache(784.0)   . Sickle cell crisis ([place])   . Syphilis 2015   Was diagnosed and received one injection of antibiotics  . Thrombocytosis ([place]) 11/22/2014     CBC Latest Ref Rng & Units 12/13/2018 12/11/2018 12/10/2018 WBC 4.0 - 10.5 K/uL 14.8(H) 13.2(H) 15.9(H) Hemoglobin 12.0 - 15.0 g/dL 8.2(L) 7.7(L) 8.1(L) Hematocrit 36.0 - 46.0 % 23.7(L) 23.2(L) 24.5(L) Platelets 150 - 400 K/uL 326 399 388     Past Surgical History:  Procedure Laterality Date  . CESAREAN SECTION N/A 02/05/2019   Procedure: CESAREAN SECTION;  Surgeon: [name], MD;  Location: [facility] LD ORS;  Service: Obstetrics;  Laterality: N/A;  . CHOLECYSTECTOMY N/A 11/30/2014   Procedure: LAPAROSCOPIC CHOLECYSTECTOMY SINGLE SITE WITH INTRAOPERATIVE CHOLANGIOGRAM;  Surgeon: Michael Boston, MD;  Location: [facility] ORS;  Service: General;  Laterality: N/A;  . SPLENECTOMY     Family History  Problem Relation Age of Onset  . Hypertension Mother   . Sickle cell anemia Sister   . Kidney disease Sister        Lupus  . Arthritis Sister   . Sickle cell anemia Sister   . Sickle cell trait Sister   . Heart disease Maternal Aunt        CABG  . Heart disease Maternal Uncle        CABG  . Lupus Sister  Social History   Socioeconomic History  . Marital status: Single    Spouse name: Not on file  . Number of children: Not on file  . Years of education: Not on file  . Highest education level: Not on file  Occupational History  . Not on file  Social Needs  . Financial resource strain: Not on file  . Food insecurity    Worry: Sometimes true    Inability: Sometimes true  . Transportation needs    Medical: Yes    Non-medical: Yes  Tobacco Use  . Smoking status: Current Some Day Smoker    Types: Cigarettes  . Smokeless tobacco: Never Used   Substance and Sexual Activity  . Alcohol use: No  . Drug use: No  . Sexual activity: Yes    Birth control/protection: Injection  Lifestyle  . Physical activity    Days per week: Not on file    Minutes per session: Not on file  . Stress: Not on file  Relationships  . Social Herbalist on phone: Not on file    Gets together: Not on file    Attends religious service: Not on file    Active member of club or organization: Not on file    Attends meetings of clubs or organizations: Not on file    Relationship status: Not on file  . Intimate partner violence    Fear of current or ex partner: Not on file    Emotionally abused: Not on file    Physically abused: Not on file    Forced sexual activity: Not on file  Other Topics Concern  . Not on file  Social History Narrative  . Not on file  Review of Systems  Constitutional: Negative.   HENT: Negative.   Eyes: Negative.   Respiratory: Negative.   Cardiovascular: Negative.   Gastrointestinal: Negative.   Genitourinary: Negative.   Musculoskeletal: Positive for back pain and joint pain.  Skin: Negative.   Neurological: Negative.   Endo/Heme/Allergies: Negative.   Psychiatric/Behavioral: Negative.     Physical Exam: not currently breastfeeding. BP 109/68 (BP Location: Left Arm)   Pulse 80   Temp 98.9 F (37.2 C) (Oral)   Resp 14   SpO2 100%   General Appearance:    Alert, cooperative, no distress, appears stated age  Head:    Normocephalic, without obvious abnormality, atraumatic  Eyes:    PERRL, conjunctiva/corneas clear, EOM's intact, fundi    benign, both eyes  Nose:   Nares normal, septum midline, mucosa normal, no drainage    or sinus tenderness  Throat:   Lips, mucosa, and tongue normal; teeth and gums normal  Neck:   Supple, symmetrical, trachea midline, no adenopathy;    thyroid:  no enlargement/tenderness/nodules; no carotid   bruit or JVD  Back:     Symmetric, no curvature, ROM normal, no CVA  tenderness  Lungs:     Clear to auscultation bilaterally, respirations unlabored  Chest Wall:    No tenderness or deformity   Heart:    Regular rate and rhythm, S1 and S2 normal, no murmur, rub   or gallop  Abdomen:     Soft, non-tender, bowel sounds active all four quadrants,    no masses, no organomegaly  Extremities:   Extremities normal, atraumatic, no cyanosis or edema  Pulses:   2+ and symmetric all extremities  Skin:   Skin color, texture, turgor normal, no rashes or lesions  Neurologic:   CNII-XII intact,  normal strength, sensation and reflexes    throughout    Lab results: No results found for this or any previous visit (from the past 24 hour(s)).  Imaging results:  Dg Chest 2 View  Result Date: 09/09/2019 CLINICAL DATA:  Chest pain EXAM: CHEST - 2 VIEW COMPARISON:  Chest radiograph 02/09/2019 FINDINGS: Normal cardiac and mediastinal contours. No consolidative pulmonary opacities. No pleural effusion or pneumothorax. Osseous structures unremarkable. IMPRESSION: No active cardiopulmonary disease. Electronically Signed   By: Lovey Newcomer M.D.   On: 09/09/2019 08:50     Assessment & Plan: Patient admitted to sickle cell day infusion center for management of pain crisis.  She is opiate tolerant. Initiate IV Dilaudid via PCA per weight-based protocol.  Settings of 0.5 mg, 10-minute lockout, and 3 mg/h. Toradol 15 mg IV x1 Tylenol 1000 mg by mouth x1 Review CBC with differential, CMP, reticulocytes as results become available. Pain intensity will be reevaluated in context of functioning in relationship to baseline as her care progresses. Pain intensity remains elevated, transition to inpatient care.    [name]  APRN, MSN, FNP-C [facility] [address] [place], [place] [address] [phone]  09/10/2019, 9:58 AM    This note was prepared using Dragon speech recognition software, errors in dictation are  unintentional.

## 2019-09-10 NOTE — Telephone Encounter (Signed)
Refilled

## 2019-09-10 NOTE — Discharge Instructions (Signed)
[email or web address]   Sickle Cell Anemia, Adult  Sickle cell anemia is a condition where your red blood cells are shaped like sickles. Red blood cells carry oxygen through the body. Sickle-shaped cells do not live as long as normal red blood cells. They also clump together and block blood from flowing through the blood vessels. This prevents the body from getting enough oxygen. Sickle cell anemia causes organ damage and pain. It also increases the risk of infection. Follow these instructions at home: Medicines  Take over-the-counter and prescription medicines only as told by your doctor.  If you were prescribed an antibiotic medicine, take it as told by your doctor. Do not stop taking the antibiotic even if you start to feel better.  If you develop a fever, do not take medicines to lower the fever right away. Tell your doctor about the fever. Managing pain, stiffness, and swelling  Try these methods to help with pain: ? Use a heating pad. ? Take a warm bath. ? Distract yourself, such as by watching TV. Eating and drinking  Drink enough fluid to keep your pee (urine) clear or pale yellow. Drink more in hot weather and during exercise.  Limit or avoid alcohol.  Eat a healthy diet. Eat plenty of fruits, vegetables, whole grains, and lean protein.  Take vitamins and supplements as told by your doctor. Traveling  When traveling, keep these with you: ? Your medical information. ? The names of your doctors. ? Your medicines.  If you need to take an airplane, talk to your doctor first. Activity  Rest often.  Avoid exercises that make your heart beat much faster, such as jogging. General instructions  Do not use products that have nicotine or tobacco, such as cigarettes and e-cigarettes. If you need help quitting, ask your doctor.  Consider wearing a medical alert bracelet.  Avoid being in high places (high altitudes), such as mountains.  Avoid very hot or cold  temperatures.  Avoid places where the temperature changes a lot.  Keep all follow-up visits as told by your doctor. This is important. Contact a doctor if:  A joint hurts.  Your feet or hands hurt or swell.  You feel tired (fatigued). Get help right away if:  You have symptoms of infection. These include: ? Fever. ? Chills. ? Being very tired. ? Irritability. ? Poor eating. ? Throwing up (vomiting).  You feel dizzy or faint.  You have new stomach pain, especially on the left side.  You have a an erection (priapism) that lasts more than 4 hours.  You have numbness in your arms or legs.  You have a hard time moving your arms or legs.  You have trouble talking.  You have pain that does not go away when you take medicine.  You are short of breath.  You are breathing fast.  You have a long-term cough.  You have pain in your chest.  You have a bad headache.  You have a stiff neck.  Your stomach looks bloated even though you did not eat much.  Your skin is pale.  You suddenly cannot see well. Summary  Sickle cell anemia is a condition where your red blood cells are shaped like sickles.  Follow your doctor's advice on ways to manage pain, food to eat, activities to do, and steps to take for safe travel.  Get medical help right away if you have any signs of infection, such as a fever. This information is not intended to replace advice given  to you by your health care provider. Make sure you discuss any questions you have with your health care provider. Document Released: 08/08/2013 Document Revised: 02/09/2019 Document Reviewed: 11/23/2016 Elsevier Patient Education  2020 [employer].

## 2019-09-10 NOTE — Telephone Encounter (Signed)
Refill request for oxycodone 10mg  and Morphine, Patient is completely out and does not want to go back to hospital. Please advise.

## 2019-09-10 NOTE — Telephone Encounter (Signed)
Patient needs refill on Morphine too, Please advise.

## 2019-09-10 NOTE — Discharge Summary (Signed)
[facility] Discharge Summary   Patient ID: Carmen [name] MRN: [id number] DOB/AGE: 1991/09/11 28 y.o.  Admit date: 09/10/2019 Discharge date: 09/10/2019  Primary Care Physician:  Lanae Boast, [place]  Admission Diagnoses:  Active Problems:   Sickle cell pain crisis [facility])   Discharge Medications:  Allergies as of 09/10/2019   No Known Allergies     Medication List    TAKE these medications   aspirin EC 81 MG tablet Take 1 tablet (81 mg total) by mouth daily.   ergocalciferol 1.25 MG (50000 UT) capsule Commonly known as: VITAMIN D2 Take 1 capsule (50,000 Units total) by mouth once a week.   folic acid 1 MG tablet Commonly known as: FOLVITE Take 1 tablet (1 mg total) by mouth daily.   ibuprofen 800 MG tablet Commonly known as: ADVIL Take 1 tablet (800 mg total) by mouth 3 (three) times daily with meals as needed.   morphine 60 MG 12 hr tablet Commonly known as: MS Contin Take 1 tablet (60 mg total) by mouth every 12 (twelve) hours.   Oxycodone HCl 10 MG Tabs Take 1 tablet (10 mg total) by mouth every 6 (six) hours as needed for up to 15 days (PAIN).   PrePLUS 27-1 MG Tabs Take 1 tablet by mouth daily.        Consults:  None  Significant Diagnostic Studies:  Dg Chest 2 View  Result Date: 09/09/2019 CLINICAL DATA:  Chest pain EXAM: CHEST - 2 VIEW COMPARISON:  Chest radiograph 02/09/2019 FINDINGS: Normal cardiac and mediastinal contours. No consolidative pulmonary opacities. No pleural effusion or pneumothorax. Osseous structures unremarkable. IMPRESSION: No active cardiopulmonary disease. Electronically Signed   By: Lovey Newcomer M.D.   On: 09/09/2019 08:50   History of present illness:  Carmen [name], a 28 year old female with a medical history significant for sickle cell disease, chronic pain syndrome, opiate dependence, and tolerance presents complaining of generalized pain that is consistent with her typical pain crisis. She attributes current  pain crisis to being out of home medications.  Patient was treated and evaluated in the emergency department on 09/09/2019 for this problem without complete resolution.  Current pain intensity is 9/10 characterized as constant and aching.  Patient also endorses pain to mid chest, that typically occurs during her sickle cell crisis.  She says that she has been home pain medications over the past several days.   Medications are due to be refilled on today.  Last medication was received in the emergency department on last night without sustained relief.  She denies fever, chills, sick contacts, recent travel, or exposure to COVID-19.  She also denies headache, dizziness, paresthesias, chest pain, sore throat, shortness of breath, persistent cough, dysuria, or nausea, vomiting, or diarrhea.   [facility] Course: Patient was admitted to sickle cell day infusion center for management of pain crisis.  Reviewed all laboratory values, reassuring.  Pain managed with IV dilaudid via PCA per weight-based protocol.  Settings of 0.5 mg, 10-minute lockout, and 3 mg/h. Toradol 15 mg IV x1 Tylenol 1000 mg by mouth x1 IV fluids, 0.45% saline at 100 mL/h Patient's pain intensity decreased to 4/10.  Patient's medications have been sent to pharmacy, she is advised to pick up medications and take as prescribed. Patient alert, oriented, and ambulating without assistance. She will discharge home in a hemodynamically stable condition.    Discharge instructions Resume all home medications.  Follow up with PCP as previously  scheduled.  Discussed the importance of drinking 64 ounces of water daily to  help prevent pain crises, it is important to drink plenty of water throughout the day. This is because dehydration of red blood cells may lead further sickling.   Avoid all stressors that precipitate sickle cell pain crisis.     The patient was given clear instructions to go to ER or return to medical  center if symptoms do not improve, worsen or new problems develop.     Physical Exam at Discharge:  BP 109/68 (BP Location: Left Arm)   Pulse 80   Temp 98.9 F (37.2 C) (Oral)   Resp 14   SpO2 100%  Physical Exam Constitutional:      Appearance: Normal appearance.  HENT:     Mouth/Throat:     Mouth: Mucous membranes are moist.     Pharynx: Oropharynx is clear.  Eyes:     Pupils: Pupils are equal, round, and reactive to light.  Cardiovascular:     Rate and Rhythm: Normal rate and regular rhythm.     Pulses: Normal pulses.  Pulmonary:     Effort: Pulmonary effort is normal.     Breath sounds: Normal breath sounds.  Abdominal:     General: Bowel sounds are normal.  Skin:    General: Skin is warm and dry.  Neurological:     General: No focal deficit present.     Mental Status: She is alert. Mental status is at baseline.  Psychiatric:        Mood and Affect: Mood normal.        Behavior: Behavior normal.        Thought Content: Thought content normal.        Judgment: Judgment normal.     Disposition at Discharge: Discharge disposition: 01-Home or Self Care       Discharge Orders: Discharge Instructions    Discharge patient   Complete by: As directed    Discharge disposition: 01-Home or Self Care   Discharge patient date: 09/10/2019      Condition at Discharge:   Stable  Time spent on Discharge:  Greater than 30 minutes.  Signed:  [name]  APRN, MSN, FNP-C [facility] [address] [place], [name] [address] [phone]  09/10/2019, 2:56 PM

## 2019-09-10 NOTE — Progress Notes (Signed)
Patient came to the waiting room of [facility] for triage. Patient reports bilateral leg and lower back pain rated 10/10. Reports being out of mediations since last Friday. Patient went to the ED yesterday for pain crisis. COVID-19 screening done and patient denies all symptoms. Denies fever, chest pain, nausea, vomiting, diarrhea and abdominal pain. Admits to having transportation at discharge without driving self. [place], [place] notified. Patient can come to the day hospital for pain management. Patient advised and expresses an understanding.

## 2019-09-10 NOTE — Progress Notes (Signed)
Patient admitted to the day infusion hospital for sickle cell pain crisis. Initially, patient reported bilateral leg and back pain rated 10/10. For pain management, patient placed on Dilaudid PCA, given 15 mg Toradol, 1000 mg Tylenol and hydrated with IV fluids. At discharge, patient rated pain at 4/10. Vital signs stable. Discharge instructions given. Patient alert, oriented and ambulatory at discharge.

## 2019-09-17 ENCOUNTER — Ambulatory Visit: Payer: Self-pay | Admitting: Family Medicine

## 2019-10-02 ENCOUNTER — Other Ambulatory Visit: Payer: Self-pay

## 2019-10-02 ENCOUNTER — Ambulatory Visit (INDEPENDENT_AMBULATORY_CARE_PROVIDER_SITE_OTHER): Payer: Medicaid Other | Admitting: Family Medicine

## 2019-10-02 DIAGNOSIS — D571 Sickle-cell disease without crisis: Secondary | ICD-10-CM | POA: Diagnosis not present

## 2019-10-02 DIAGNOSIS — Z09 Encounter for follow-up examination after completed treatment for conditions other than malignant neoplasm: Secondary | ICD-10-CM | POA: Diagnosis not present

## 2019-10-02 DIAGNOSIS — Z79891 Long term (current) use of opiate analgesic: Secondary | ICD-10-CM

## 2019-10-02 DIAGNOSIS — E559 Vitamin D deficiency, unspecified: Secondary | ICD-10-CM | POA: Diagnosis not present

## 2019-10-02 MED ORDER — MORPHINE SULFATE ER 60 MG PO TBCR: 60.0000 mg | EXTENDED_RELEASE_TABLET | Freq: Two times a day (BID) | ORAL | 0 refills | Status: DC

## 2019-10-02 MED ORDER — OXYCODONE HCL 10 MG PO TABS: 10.0000 mg | ORAL_TABLET | Freq: Four times a day (QID) | ORAL | 0 refills | Status: DC | PRN

## 2019-10-02 NOTE — Progress Notes (Signed)
Virtual Visit via Telephone Note  I connected with Carmen [name] on 10/02/19 at 11:20 AM EST by telephone and verified that I am speaking with the correct person using two identifiers.   I discussed the limitations, risks, security and privacy concerns of performing an evaluation and management service by telephone and the availability of in person appointments. I also discussed with the patient that there may be a patient responsible charge related to this service. The patient expressed understanding and agreed to proceed.   History of Present Illness:  Past Medical History:  Diagnosis Date  . Headache(784.0)   . Sickle cell crisis ([place])   . Syphilis 2015   Was diagnosed and received one injection of antibiotics  . Thrombocytosis ([place]) 11/22/2014     CBC Latest Ref Rng & Units 12/13/2018 12/11/2018 12/10/2018 WBC 4.0 - 10.5 K/uL 14.8(H) 13.2(H) 15.9(H) Hemoglobin 12.0 - 15.0 g/dL 8.2(L) 7.7(L) 8.1(L) Hematocrit 36.0 - 46.0 % 23.7(L) 23.2(L) 24.5(L) Platelets 150 - 400 K/uL [phone]      Family History  Problem Relation Age of Onset  . Hypertension Mother   . Sickle cell anemia Sister   . Kidney disease Sister        Lupus  . Arthritis Sister   . Sickle cell anemia Sister   . Sickle cell trait Sister   . Heart disease Maternal Aunt        CABG  . Heart disease Maternal Uncle        CABG  . Lupus Sister     Social History   Tobacco Use  . Smoking status: Current Some Day Smoker    Types: Cigarettes  . Smokeless tobacco: Never Used  Substance Use Topics  . Alcohol use: No  . Drug use: No    No Known Allergies  Current Outpatient Medications on File Prior to Visit  Medication Sig Dispense Refill  . aspirin EC 81 MG tablet Take 1 tablet (81 mg total) by mouth daily. 90 tablet 3  . ergocalciferol (VITAMIN D2) 50000 units capsule Take 1 capsule (50,000 Units total) by mouth once a week. 30 capsule 1  . folic acid (FOLVITE) 1 MG tablet Take 1 tablet (1 mg total) by mouth  daily. 90 tablet 3  . ibuprofen (ADVIL) 800 MG tablet Take 1 tablet (800 mg total) by mouth 3 (three) times daily with meals as needed. 30 tablet 1  . Prenatal Vit-Fe Fumarate-FA (PREPLUS) 27-1 MG TABS Take 1 tablet by mouth daily. 30 tablet 13   No current facility-administered medications on file prior to visit.     Current Status: Since her last office visit, she is doing well with no complaints. She states that she has pain in her arms and legs. She rates her pain today at 7/10. She has not had a hospital visit for Sickle Cell Crisis since 05/15/2019 where she was treated and discharged the same day. She is currently taking all medications as prescribed and staying well hydrated. She reports occasional nausea, constipation, dizziness and headaches. She denies fevers, chills, fatigue, recent infections, weight loss, and night sweats. She has not had any visual changes, and falls. No chest pain, heart palpitations, cough and shortness of breath reported. No reports of GI problems such as vomiting, and diarrhea. She has no reports of blood in stools, dysuria and hematuria. No depression or anxiety reported today.    Observations/Objective: Telephone Virtual Visit   Assessment and Plan:  1. Hb-SS disease without crisis [facility]) She  is doing well today. She will continue to take pain medications as prescribed; will continue to avoid extreme heat and cold; will continue to eat a healthy diet and drink at least 64 ounces of water daily; continue stool softener as needed; will avoid colds and flu; will continue to get plenty of sleep and rest; will continue to avoid high stressful situations and remain infection free; will continue Folic Acid 1 mg daily to avoid sickle cell crisis. Continue to follow up with Hematologist as needed.  - Oxycodone HCl 10 MG TABS; Take 1 tablet (10 mg total) by mouth every 6 (six) hours as needed for up to 15 days (PAIN).  Dispense: 60 tablet; Refill: 0 - morphine (MS  CONTIN) 60 MG 12 hr tablet; Take 1 tablet (60 mg total) by mouth every 12 (twelve) hours.  Dispense: 60 tablet; Refill: 0  2. Chronic prescription opiate use - Oxycodone HCl 10 MG TABS; Take 1 tablet (10 mg total) by mouth every 6 (six) hours as needed for up to 15 days (PAIN).  Dispense: 60 tablet; Refill: 0 - morphine (MS CONTIN) 60 MG 12 hr tablet; Take 1 tablet (60 mg total) by mouth every 12 (twelve) hours.  Dispense: 60 tablet; Refill: 0  3. Vitamin D deficiency  Meds ordered this encounter  Medications  . Oxycodone HCl 10 MG TABS    Sig: Take 1 tablet (10 mg total) by mouth every 6 (six) hours as needed for up to 15 days (PAIN).    Dispense:  60 tablet    Refill:  0    Order Specific Question:   Supervising Provider    Answer:   Tresa Garter [id number]  . morphine (MS CONTIN) 60 MG 12 hr tablet    Sig: Take 1 tablet (60 mg total) by mouth every 12 (twelve) hours.    Dispense:  60 tablet    Refill:  0    Please refill this medication early, as patient has been using more often. Okay to refill on 10/07/2019.    Order Specific Question:   Supervising Provider    Answer:   Tresa Garter [id number]    No orders of the defined types were placed in this encounter.   Referral Orders  No referral(s) requested today    Kathe Becton,  MSN, FNP-BC [facility] [address] [place], [place] [address] [phone]- fax    Follow Up Instructions:  She will follow up in 2 months.  I discussed the assessment and treatment plan with the patient. The patient was provided an opportunity to ask questions and all were answered. The patient agreed with the plan and demonstrated an understanding of the instructions.   The patient was advised to call back or seek an in-person evaluation if the symptoms worsen or if the condition fails to improve as anticipated.  I provided 15 minutes of  non-face-to-face time during this encounter.   Azzie Glatter, FNP

## 2019-10-08 ENCOUNTER — Other Ambulatory Visit: Payer: Self-pay | Admitting: Internal Medicine

## 2019-10-08 DIAGNOSIS — E559 Vitamin D deficiency, unspecified: Secondary | ICD-10-CM

## 2019-10-23 ENCOUNTER — Telehealth: Payer: Self-pay | Admitting: Family Medicine

## 2019-10-24 ENCOUNTER — Other Ambulatory Visit: Payer: Self-pay | Admitting: Family Medicine

## 2019-10-24 DIAGNOSIS — Z79891 Long term (current) use of opiate analgesic: Secondary | ICD-10-CM

## 2019-10-24 DIAGNOSIS — D571 Sickle-cell disease without crisis: Secondary | ICD-10-CM

## 2019-10-24 MED ORDER — OXYCODONE HCL 10 MG PO TABS: 10.0000 mg | ORAL_TABLET | Freq: Four times a day (QID) | ORAL | 0 refills | Status: DC | PRN

## 2019-10-24 NOTE — Telephone Encounter (Signed)
done

## 2019-11-01 ENCOUNTER — Telehealth: Payer: Self-pay

## 2019-11-01 ENCOUNTER — Other Ambulatory Visit: Payer: Self-pay | Admitting: Family Medicine

## 2019-11-01 DIAGNOSIS — Z79891 Long term (current) use of opiate analgesic: Secondary | ICD-10-CM

## 2019-11-01 DIAGNOSIS — D571 Sickle-cell disease without crisis: Secondary | ICD-10-CM

## 2019-11-01 MED ORDER — MORPHINE SULFATE ER 60 MG PO TBCR: 60.0000 mg | EXTENDED_RELEASE_TABLET | Freq: Two times a day (BID) | ORAL | 0 refills | Status: DC

## 2019-11-01 NOTE — Telephone Encounter (Signed)
Voicemail received today asking for refill for Morphine 60mg . Please advise.

## 2019-11-05 ENCOUNTER — Other Ambulatory Visit: Payer: Self-pay

## 2019-11-05 ENCOUNTER — Emergency Department (HOSPITAL_COMMUNITY)
Admission: EM | Admit: 2019-11-05 | Discharge: 2019-11-05 | Disposition: A | Discharge status: Home / Self Care | Payer: Medicaid Other | Attending: Emergency Medicine | Admitting: Emergency Medicine

## 2019-11-05 ENCOUNTER — Encounter (HOSPITAL_COMMUNITY): Payer: Self-pay | Admitting: Emergency Medicine

## 2019-11-05 DIAGNOSIS — F17210 Nicotine dependence, cigarettes, uncomplicated: Secondary | ICD-10-CM | POA: Diagnosis not present

## 2019-11-05 DIAGNOSIS — D57219 Sickle-cell/Hb-C disease with crisis, unspecified: Secondary | ICD-10-CM | POA: Diagnosis not present

## 2019-11-05 DIAGNOSIS — Z7982 Long term (current) use of aspirin: Secondary | ICD-10-CM | POA: Insufficient documentation

## 2019-11-05 DIAGNOSIS — D5700 Hb-SS disease with crisis, unspecified: Secondary | ICD-10-CM

## 2019-11-05 DIAGNOSIS — M549 Dorsalgia, unspecified: Secondary | ICD-10-CM | POA: Diagnosis present

## 2019-11-05 LAB — CBC WITH DIFFERENTIAL/PLATELET
Abs Immature Granulocytes: 0.03 10*3/uL (ref 0.00–0.07)
Basophils Absolute: 0.1 10*3/uL (ref 0.0–0.1)
Basophils Relative: 1 %
Eosinophils Absolute: 0.2 10*3/uL (ref 0.0–0.5)
Eosinophils Relative: 2 %
HCT: 29.7 % — ABNORMAL LOW (ref 36.0–46.0)
Hemoglobin: 10.7 g/dL — ABNORMAL LOW (ref 12.0–15.0)
Immature Granulocytes: 0 %
Lymphocytes Relative: 28 %
Lymphs Abs: 2.3 10*3/uL (ref 0.7–4.0)
MCH: 30.7 pg (ref 26.0–34.0)
MCHC: 36 g/dL (ref 30.0–36.0)
MCV: 85.3 fL (ref 80.0–100.0)
Monocytes Absolute: 0.7 10*3/uL (ref 0.1–1.0)
Monocytes Relative: 9 %
Neutro Abs: 4.8 10*3/uL (ref 1.7–7.7)
Neutrophils Relative %: 60 %
Platelets: 884 10*3/uL — ABNORMAL HIGH (ref 150–400)
RBC: 3.48 MIL/uL — ABNORMAL LOW (ref 3.87–5.11)
RDW: 15.5 % (ref 11.5–15.5)
WBC: 8.1 10*3/uL (ref 4.0–10.5)
nRBC: 0.4 % — ABNORMAL HIGH (ref 0.0–0.2)

## 2019-11-05 LAB — RETICULOCYTES
Immature Retic Fract: 33.5 % — ABNORMAL HIGH (ref 2.3–15.9)
RBC.: 3.52 MIL/uL — ABNORMAL LOW (ref 3.87–5.11)
Retic Count, Absolute: 240.8 10*3/uL — ABNORMAL HIGH (ref 19.0–186.0)
Retic Ct Pct: 6.8 % — ABNORMAL HIGH (ref 0.4–3.1)

## 2019-11-05 MED ORDER — HYDROMORPHONE HCL 2 MG/ML IJ SOLN: 2.0000 mg | INTRAMUSCULAR | Status: AC

## 2019-11-05 MED ORDER — HYDROMORPHONE HCL 2 MG/ML IJ SOLN
2.0000 mg | INTRAMUSCULAR | Status: AC
  Administered 2019-11-05: 2 mg via INTRAVENOUS
  Filled 2019-11-05: qty 1

## 2019-11-05 MED ORDER — DIPHENHYDRAMINE HCL 25 MG PO CAPS
25.0000 mg | ORAL_CAPSULE | Freq: Once | ORAL | Status: AC
  Administered 2019-11-05: 25 mg via ORAL
  Filled 2019-11-05: qty 1

## 2019-11-05 MED ORDER — DIPHENHYDRAMINE HCL 25 MG PO CAPS
25.0000 mg | ORAL_CAPSULE | ORAL | Status: DC | PRN
  Filled 2019-11-05: qty 1

## 2019-11-05 MED ORDER — MORPHINE SULFATE ER 30 MG PO TBCR
60.0000 mg | EXTENDED_RELEASE_TABLET | Freq: Two times a day (BID) | ORAL | Status: DC
  Administered 2019-11-05: 60 mg via ORAL
  Filled 2019-11-05: qty 4

## 2019-11-05 MED ORDER — HYDROMORPHONE HCL 1 MG/ML IJ SOLN
1.0000 mg | Freq: Once | INTRAMUSCULAR | Status: AC
  Administered 2019-11-05: 1 mg via INTRAVENOUS
  Filled 2019-11-05: qty 1

## 2019-11-05 MED ORDER — ONDANSETRON HCL 4 MG/2ML IJ SOLN
4.0000 mg | INTRAMUSCULAR | Status: DC | PRN
  Administered 2019-11-05: 4 mg via INTRAVENOUS
  Filled 2019-11-05: qty 2

## 2019-11-05 NOTE — ED Notes (Signed)
Recollect light green tube collected as per lab and sent.

## 2019-11-05 NOTE — ED Triage Notes (Signed)
Pt c/o generalized sickle cell pains x week.

## 2019-11-05 NOTE — Discharge Instructions (Addendum)
You are seen in the ER for sickle cell pain. Your lab results have still not come back.  You are wanting to go home because of childcare issues.  Please call the sickle cell clinic tomorrow if your pain is still persistent.  They will be able to facilitate you in the clinic can help you with pain medication if needed.  Return to the ER if your symptoms get worse.

## 2019-11-05 NOTE — ED Provider Notes (Signed)
[facility] DEPT Provider Note   CSN: [id number] Arrival date & time: 11/05/19  0351     History Chief Complaint  Patient presents with  . Sickle Cell Pain Crisis  . Generalized Body Aches    Carmen [name] is a 29 y.o. female.  HPI     29 year old female comes in a chief complaint of sickle cell pain.  Patient reports that she started having some back pain, abdominal pain, leg pain 2 weeks ago.  She has been managing her pain at home, but 2 days ago she ran out of her pain medications.  She has been taking oxycodone for breakthrough pain but not having any results.  Patient denies any nausea, vomiting, fevers, chills. Patient does not think she is pregnant.  She denies any UTI-like symptoms.  She suspects that the weather change or start of periods could be the cause for the pain.  She is also out of her pain meds.  Past Medical History:  Diagnosis Date  . Headache(784.0)   . Sickle cell crisis ([place])   . Syphilis 2015   Was diagnosed and received one injection of antibiotics  . Thrombocytosis ([place]) 11/22/2014     CBC Latest Ref Rng & Units 12/13/2018 12/11/2018 12/10/2018 WBC 4.0 - 10.5 K/uL 14.8(H) 13.2(H) 15.9(H) Hemoglobin 12.0 - 15.0 g/dL 8.2(L) 7.7(L) 8.1(L) Hematocrit 36.0 - 46.0 % 23.7(L) 23.2(L) 24.5(L) Platelets 150 - 400 K/uL 326 399 388      Patient Active Problem List   Diagnosis Date Noted  . Sickle cell pain crisis ([place]) 09/10/2019  . Sickle cell disease ([place]) 12/04/2018  . Alpha thalassemia silent carrier 12/04/2018  . Chronic prescription opiate use 03/13/2018  . Chronic musculoskeletal pain 03/13/2018  . Vitamin D deficiency 01/13/2018  . Leukocytosis 08/21/2017  . 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 Hb-SS disease without crisis ([place]) 08/15/2016  . Anemia of chronic disease   . Systolic ejection murmur [id number]  . Sickle cell anemia of mother during pregnancy [facility]) 05/16/2014    Past Surgical  History:  Procedure Laterality Date  . CESAREAN SECTION N/A 02/05/2019   Procedure: CESAREAN SECTION;  Surgeon: [name], MD;  Location: [facility] LD ORS;  Service: Obstetrics;  Laterality: N/A;  . CHOLECYSTECTOMY N/A 11/30/2014   Procedure: LAPAROSCOPIC CHOLECYSTECTOMY SINGLE SITE WITH INTRAOPERATIVE CHOLANGIOGRAM;  Surgeon: Michael Boston, MD;  Location: [facility] ORS;  Service: General;  Laterality: N/A;  . SPLENECTOMY       OB History    Gravida  2   Para  1   Term  1   Preterm      AB  1   Living  1     SAB      TAB  1   Ectopic      Multiple  0   Live Births  1           Family History  Problem Relation Age of Onset  . Hypertension Mother   . Sickle cell anemia Sister   . Kidney disease Sister        Lupus  . Arthritis Sister   . Sickle cell anemia Sister   . Sickle cell trait Sister   . Heart disease Maternal Aunt        CABG  . Heart disease Maternal Uncle        CABG  . Lupus Sister     Social History   Tobacco Use  . Smoking status:  Current Some Day Smoker    Types: Cigarettes  . Smokeless tobacco: Never Used  Substance Use Topics  . Alcohol use: No  . Drug use: No    Home Medications Prior to Admission medications   Medication Sig Start Date End Date Taking? Authorizing Provider  aspirin EC 81 MG tablet Take 1 tablet (81 mg total) by mouth daily. 03/15/19   Lanae Boast, FNP  folic acid (FOLVITE) 1 MG tablet Take 1 tablet (1 mg total) by mouth daily. 03/15/19   Lanae Boast, FNP  ibuprofen (ADVIL) 800 MG tablet Take 1 tablet (800 mg total) by mouth 3 (three) times daily with meals as needed. 03/15/19   Lanae Boast, FNP  morphine (MS CONTIN) 60 MG 12 hr tablet Take 1 tablet (60 mg total) by mouth every 12 (twelve) hours. 11/07/19 12/07/19  Azzie Glatter, FNP  Oxycodone HCl 10 MG TABS Take 1 tablet (10 mg total) by mouth every 6 (six) hours as needed for up to 15 days (PAIN). 10/24/19 11/08/19  Azzie Glatter, FNP  Prenatal Vit-Fe  Fumarate-FA (PREPLUS) 27-1 MG TABS Take 1 tablet by mouth daily. 08/17/18   [name], MD  Vitamin D, Ergocalciferol, (DRISDOL) 1.25 MG (50000 UT) CAPS capsule TAKE ONE CAPSULE BY MOUTH ONE TIME PER WEEK 10/11/19   Azzie Glatter, FNP    Allergies    Patient has no known allergies.  Review of Systems   Review of Systems  Constitutional: Positive for activity change.  Gastrointestinal: Positive for abdominal pain.  Musculoskeletal: Positive for back pain.  Hematological: Does not bruise/bleed easily.  All other systems reviewed and are negative.   Physical Exam Updated Vital Signs BP (!) 119/101   Pulse 77   Temp 99.2 F (37.3 C)   Resp 15   Ht 5\' 3"  (1.6 m)   Wt 72.6 kg   SpO2 95%   BMI 28.34 kg/m   Physical Exam Vitals and nursing note reviewed.  Constitutional:      Appearance: She is well-developed.  HENT:     Head: Normocephalic and atraumatic.  Cardiovascular:     Rate and Rhythm: Normal rate.  Pulmonary:     Effort: Pulmonary effort is normal.  Abdominal:     General: Bowel sounds are normal.     Tenderness: There is no abdominal tenderness.  Musculoskeletal:        General: No swelling or deformity.     Cervical back: Normal range of motion and neck supple.  Skin:    General: Skin is warm and dry.  Neurological:     Mental Status: She is alert and oriented to person, place, and time.     ED Results / Procedures / Treatments   Labs (all labs ordered are listed, but only abnormal results are displayed) Labs Reviewed  CBC WITH DIFFERENTIAL/PLATELET - Abnormal; Notable for the following components:      Result Value   RBC 3.48 (*)    Hemoglobin 10.7 (*)    HCT 29.7 (*)    Platelets 884 (*)    nRBC 0.4 (*)    All other components within normal limits  RETICULOCYTES - Abnormal; Notable for the following components:   Retic Ct Pct 6.8 (*)    RBC. 3.52 (*)    Retic Count, Absolute 240.8 (*)    Immature Retic Fract 33.5 (*)    All other  components within normal limits  COMPREHENSIVE METABOLIC PANEL    EKG None  Radiology No  results found.  Procedures Procedures (including critical care time)  Medications Ordered in ED Medications  diphenhydrAMINE (BENADRYL) capsule 25-50 mg (has no administration in time range)  ondansetron (ZOFRAN) injection 4 mg (4 mg Intravenous Given 11/05/19 0925)  morphine (MS CONTIN) 12 hr tablet 60 mg (60 mg Oral Given 11/05/19 1132)  HYDROmorphone (DILAUDID) injection 2 mg (2 mg Intravenous Given 11/05/19 0928)    Or  HYDROmorphone (DILAUDID) injection 2 mg ( Subcutaneous See Alternative 11/05/19 0928)  HYDROmorphone (DILAUDID) injection 2 mg (2 mg Intravenous Given 11/05/19 1015)    Or  HYDROmorphone (DILAUDID) injection 2 mg ( Subcutaneous See Alternative 11/05/19 1015)  diphenhydrAMINE (BENADRYL) capsule 25 mg (25 mg Oral Given 11/05/19 1100)  HYDROmorphone (DILAUDID) injection 1 mg (1 mg Intravenous Given 11/05/19 1129)    ED Course  I have reviewed the triage vital signs and the nursing notes.  Pertinent labs & imaging results that were available during my care of the patient were reviewed by me and considered in my medical decision making (see chart for details).    MDM Rules/Calculators/A&P                      29 year old comes in a chief complaint of sickle cell pain. She is having generalized body aches, lower abdominal pain, back pain, leg pain.  Pain is similar to her sickle cell crisis.  She reports that the pain could be because of her starting her period again (she is 8 months postpartum).  She has no UTI-like symptoms.  She also thinks weather change could be contributing.  Basic labs ordered.  After 3 doses of pain medication I went to reassess the patient and ask for a urine sample for pregnancy test.  The lab was unable to run her CMP and wanted to ask her if she can give [place] urine sample versus we can do a serum pregnancy test.  Patient had already changed and informed me that  she has to leave because her baby is crying.  She would be open to following up with sickle cell clinic tomorrow. Final Clinical Impression(s) / ED Diagnoses Final diagnoses:  Sickle cell pain crisis [facility])    Rx / DC Orders ED Discharge Orders    None       Varney Biles, MD 11/05/19 1229

## 2019-11-05 NOTE — ED Notes (Signed)
Patient stated that she had to gt home to her baby and needed to be discharged after receiving her 3rd dose of Dilaudid

## 2019-11-06 ENCOUNTER — Other Ambulatory Visit: Payer: Self-pay

## 2019-11-06 ENCOUNTER — Encounter (HOSPITAL_COMMUNITY): Payer: Self-pay | Admitting: *Deleted

## 2019-11-06 DIAGNOSIS — M7918 Myalgia, other site: Secondary | ICD-10-CM | POA: Diagnosis not present

## 2019-11-06 DIAGNOSIS — Z5321 Procedure and treatment not carried out due to patient leaving prior to being seen by health care provider: Secondary | ICD-10-CM | POA: Diagnosis not present

## 2019-11-06 LAB — COMPREHENSIVE METABOLIC PANEL
ALT: 19 U/L (ref 0–44)
AST: 21 U/L (ref 15–41)
Albumin: 4.7 g/dL (ref 3.5–5.0)
Alkaline Phosphatase: 63 U/L (ref 38–126)
Anion gap: 9 (ref 5–15)
BUN: 10 mg/dL (ref 6–20)
CO2: 24 mmol/L (ref 22–32)
Calcium: 9.4 mg/dL (ref 8.9–10.3)
Chloride: 109 mmol/L (ref 98–111)
Creatinine, Ser: 0.51 mg/dL (ref 0.44–1.00)
GFR calc Af Amer: >60 mL/min (ref >60)
GFR calc non Af Amer: >60 mL/min (ref >60)
Glucose, Bld: 96 mg/dL (ref 70–99)
Potassium: 3.9 mmol/L (ref 3.5–5.1)
Sodium: 142 mmol/L (ref 135–145)
Total Bilirubin: 1.2 mg/dL (ref 0.3–1.2)
Total Protein: 8.6 g/dL — ABNORMAL HIGH (ref 6.5–8.1)

## 2019-11-06 LAB — CBC WITH DIFFERENTIAL/PLATELET
Abs Immature Granulocytes: 0.03 10*3/uL (ref 0.00–0.07)
Basophils Absolute: 0.1 10*3/uL (ref 0.0–0.1)
Basophils Relative: 1 %
Eosinophils Absolute: 0.2 10*3/uL (ref 0.0–0.5)
Eosinophils Relative: 3 %
HCT: 31.2 % — ABNORMAL LOW (ref 36.0–46.0)
Hemoglobin: 11 g/dL — ABNORMAL LOW (ref 12.0–15.0)
Immature Granulocytes: 0 %
Lymphocytes Relative: 40 %
Lymphs Abs: 3.3 10*3/uL (ref 0.7–4.0)
MCH: 30.2 pg (ref 26.0–34.0)
MCHC: 35.3 g/dL (ref 30.0–36.0)
MCV: 85.7 fL (ref 80.0–100.0)
Monocytes Absolute: 0.9 10*3/uL (ref 0.1–1.0)
Monocytes Relative: 11 %
Neutro Abs: 3.8 10*3/uL (ref 1.7–7.7)
Neutrophils Relative %: 45 %
Platelets: 705 10*3/uL — ABNORMAL HIGH (ref 150–400)
RBC: 3.64 MIL/uL — ABNORMAL LOW (ref 3.87–5.11)
RDW: 14.5 % (ref 11.5–15.5)
WBC: 8.2 10*3/uL (ref 4.0–10.5)
nRBC: 0.6 % — ABNORMAL HIGH (ref 0.0–0.2)

## 2019-11-06 LAB — RETICULOCYTES
Immature Retic Fract: 26.4 % — ABNORMAL HIGH (ref 2.3–15.9)
RBC.: 3.69 MIL/uL — ABNORMAL LOW (ref 3.87–5.11)
Retic Count, Absolute: 164.9 10*3/uL (ref 19.0–186.0)
Retic Ct Pct: 4.5 % — ABNORMAL HIGH (ref 0.4–3.1)

## 2019-11-06 LAB — I-STAT BETA HCG BLOOD, ED (MC, WL, AP ONLY): I-stat hCG, quantitative: <5 m[IU]/mL (ref <5)

## 2019-11-06 MED ORDER — SODIUM CHLORIDE 0.9% FLUSH: 3.0000 mL | Freq: Once | INTRAVENOUS | Status: DC

## 2019-11-06 MED ORDER — HYDROMORPHONE HCL 1 MG/ML IJ SOLN
0.5000 mg | Freq: Once | INTRAMUSCULAR | Status: AC
  Administered 2019-11-06: 0.5 mg via SUBCUTANEOUS
  Filled 2019-11-06: qty 1

## 2019-11-06 NOTE — ED Notes (Signed)
Rounding in lobby: pt requests pain meds while she waits Pt informed that I will ask the Triage RN  Pt ambulatory from cubby in lobby to seat without assistance and with steady gait Triage RN Danise Mina informed of same

## 2019-11-06 NOTE — ED Triage Notes (Signed)
Pt presents with SCC that has been on and off for 3 weeks. Generalized pain

## 2019-11-07 ENCOUNTER — Telehealth (HOSPITAL_COMMUNITY): Payer: Self-pay | Admitting: General Practice

## 2019-11-07 ENCOUNTER — Encounter (HOSPITAL_COMMUNITY): Payer: Self-pay | Admitting: Family Medicine

## 2019-11-07 ENCOUNTER — Non-Acute Institutional Stay (HOSPITAL_BASED_OUTPATIENT_CLINIC_OR_DEPARTMENT_OTHER)
Admission: AD | Admit: 2019-11-07 | Discharge: 2019-11-07 | Disposition: A | Discharge status: Home / Self Care | Payer: Medicaid Other | Source: Ambulatory Visit | Attending: Internal Medicine | Admitting: Internal Medicine

## 2019-11-07 ENCOUNTER — Emergency Department (HOSPITAL_COMMUNITY)
Admission: EM | Admit: 2019-11-07 | Discharge: 2019-11-07 | Disposition: A | Discharge status: Home / Self Care | Payer: Medicaid Other | Attending: Emergency Medicine | Admitting: Emergency Medicine

## 2019-11-07 DIAGNOSIS — F1120 Opioid dependence, uncomplicated: Secondary | ICD-10-CM | POA: Insufficient documentation

## 2019-11-07 DIAGNOSIS — D5700 Hb-SS disease with crisis, unspecified: Secondary | ICD-10-CM | POA: Insufficient documentation

## 2019-11-07 DIAGNOSIS — G894 Chronic pain syndrome: Secondary | ICD-10-CM | POA: Insufficient documentation

## 2019-11-07 DIAGNOSIS — F17210 Nicotine dependence, cigarettes, uncomplicated: Secondary | ICD-10-CM | POA: Insufficient documentation

## 2019-11-07 DIAGNOSIS — D638 Anemia in other chronic diseases classified elsewhere: Secondary | ICD-10-CM | POA: Insufficient documentation

## 2019-11-07 DIAGNOSIS — Z8249 Family history of ischemic heart disease and other diseases of the circulatory system: Secondary | ICD-10-CM | POA: Insufficient documentation

## 2019-11-07 DIAGNOSIS — Z7982 Long term (current) use of aspirin: Secondary | ICD-10-CM | POA: Insufficient documentation

## 2019-11-07 MED ORDER — HYDROMORPHONE 1 MG/ML IV SOLN
INTRAVENOUS | Status: DC
  Administered 2019-11-07: 30 mg via INTRAVENOUS
  Administered 2019-11-07: 8.4 mg via INTRAVENOUS
  Filled 2019-11-07: qty 30

## 2019-11-07 MED ORDER — ACETAMINOPHEN 500 MG PO TABS
1000.0000 mg | ORAL_TABLET | Freq: Once | ORAL | Status: AC
  Administered 2019-11-07: 1000 mg via ORAL
  Filled 2019-11-07: qty 2

## 2019-11-07 MED ORDER — ONDANSETRON HCL 4 MG/2ML IJ SOLN
4.0000 mg | Freq: Four times a day (QID) | INTRAMUSCULAR | Status: DC | PRN
  Administered 2019-11-07: 4 mg via INTRAVENOUS
  Filled 2019-11-07: qty 2

## 2019-11-07 MED ORDER — DIPHENHYDRAMINE HCL 25 MG PO CAPS: 25.0000 mg | ORAL_CAPSULE | ORAL | Status: DC | PRN

## 2019-11-07 MED ORDER — SODIUM CHLORIDE 0.9% FLUSH: 9.0000 mL | INTRAVENOUS | Status: DC | PRN

## 2019-11-07 MED ORDER — KETOROLAC TROMETHAMINE 30 MG/ML IJ SOLN
15.0000 mg | Freq: Once | INTRAMUSCULAR | Status: AC
  Administered 2019-11-07: 15 mg via INTRAVENOUS
  Filled 2019-11-07: qty 1

## 2019-11-07 MED ORDER — SODIUM CHLORIDE 0.45 % IV SOLN: INTRAVENOUS | Status: DC

## 2019-11-07 MED ORDER — NALOXONE HCL 0.4 MG/ML IJ SOLN: 0.4000 mg | INTRAMUSCULAR | Status: DC | PRN

## 2019-11-07 NOTE — H&P (Signed)
[facility] History and Physical   Date: 11/07/2019  Patient name: Carmen [name] Medical record number: [id number] Date of birth: [birth year] Age: 29 y.o. Gender: female PCP: Lanae Boast, [place]  Attending physician: Tresa Garter, MD  Chief Complaint: Sickle cell pain  History of Present Illness: [name], a 29 year old female with a medical history significant for sickle cell disease, chronic pain syndrome, opiate dependence and tolerance, and history of anemia of chronic disease. Patient attributes pain crisis to running out of opiate medications. She says that pain has been uncontrolled. [name] has been treated and evaluated in the ER over the past several days without complete resolution of this problem. Patient's pain intensity is 9/10, mainly in low back and lower extremities. [name] says that she has been unable to get comfortable. She characterizes pain as constant and aching. She denies headache, chest pain, shortness of breath, dizziness, urinary symptoms, nausea, vomiting, or diarrhea. No sick contacts, recent travel, or exposure to COVID-19.   Meds: Medications Prior to Admission  Medication Sig Dispense Refill Last Dose  . aspirin EC 81 MG tablet Take 1 tablet (81 mg total) by mouth daily. 90 tablet 3   . folic acid (FOLVITE) 1 MG tablet Take 1 tablet (1 mg total) by mouth daily. 90 tablet 3   . ibuprofen (ADVIL) 800 MG tablet Take 1 tablet (800 mg total) by mouth 3 (three) times daily with meals as needed. 30 tablet 1   . morphine (MS CONTIN) 60 MG 12 hr tablet Take 1 tablet (60 mg total) by mouth every 12 (twelve) hours. 60 tablet 0   . Oxycodone HCl 10 MG TABS Take 1 tablet (10 mg total) by mouth every 6 (six) hours as needed for up to 15 days (PAIN). 60 tablet 0   . Prenatal Vit-Fe Fumarate-FA (PREPLUS) 27-1 MG TABS Take 1 tablet by mouth daily. 30 tablet 13   . Vitamin D, Ergocalciferol, (DRISDOL) 1.25 MG (50000 UT) CAPS capsule TAKE ONE CAPSULE BY MOUTH  ONE TIME PER WEEK (Patient taking differently: Take 50,000 Units by mouth every 7 (seven) days. ) 30 capsule 1     Allergies: Patient has no known allergies. Past Medical History:  Diagnosis Date  . Headache(784.0)   . Sickle cell crisis ([place])   . Syphilis 2015   Was diagnosed and received one injection of antibiotics  . Thrombocytosis ([place]) 11/22/2014     CBC Latest Ref Rng & Units 12/13/2018 12/11/2018 12/10/2018 WBC 4.0 - 10.5 K/uL 14.8(H) 13.2(H) 15.9(H) Hemoglobin 12.0 - 15.0 g/dL 8.2(L) 7.7(L) 8.1(L) Hematocrit 36.0 - 46.0 % 23.7(L) 23.2(L) 24.5(L) Platelets 150 - 400 K/uL 326 399 388     Past Surgical History:  Procedure Laterality Date  . CESAREAN SECTION N/A 02/05/2019   Procedure: CESAREAN SECTION;  Surgeon: [name], MD;  Location: [facility] LD ORS;  Service: Obstetrics;  Laterality: N/A;  . CHOLECYSTECTOMY N/A 11/30/2014   Procedure: LAPAROSCOPIC CHOLECYSTECTOMY SINGLE SITE WITH INTRAOPERATIVE CHOLANGIOGRAM;  Surgeon: Michael Boston, MD;  Location: [facility] ORS;  Service: General;  Laterality: N/A;  . SPLENECTOMY     Family History  Problem Relation Age of Onset  . Hypertension Mother   . Sickle cell anemia Sister   . Kidney disease Sister        Lupus  . Arthritis Sister   . Sickle cell anemia Sister   . Sickle cell trait Sister   . Heart disease Maternal Aunt  CABG  . Heart disease Maternal Uncle        CABG  . Lupus Sister    Social History   Socioeconomic History  . Marital status: Single    Spouse name: Not on file  . Number of children: Not on file  . Years of education: Not on file  . Highest education level: Not on file  Occupational History  . Not on file  Tobacco Use  . Smoking status: Current Some Day Smoker    Types: Cigarettes  . Smokeless tobacco: Never Used  Substance and Sexual Activity  . Alcohol use: No  . Drug use: No  . Sexual activity: Yes    Birth control/protection: Injection  Other Topics Concern  . Not on file  Social History  Narrative  . Not on file   Social Determinants of Health   Financial Resource Strain:   . Difficulty of Paying Living Expenses: Not on file  Food Insecurity: Food Insecurity Present  . Worried About Charity fundraiser in the Last Year: Sometimes true  . Ran Out of Food in the Last Year: Sometimes true  Transportation Needs: Unmet Transportation Needs  . Lack of Transportation (Medical): Yes  . Lack of Transportation (Non-Medical): Yes  Physical Activity:   . Days of Exercise per Week: Not on file  . Minutes of Exercise per Session: Not on file  Stress:   . Feeling of Stress : Not on file  Social Connections:   . Frequency of Communication with Friends and Family: Not on file  . Frequency of Social Gatherings with Friends and Family: Not on file  . Attends Religious Services: Not on file  . Active Member of Clubs or Organizations: Not on file  . Attends Archivist Meetings: Not on file  . Marital Status: Not on file  Intimate Partner Violence:   . Fear of Current or Ex-Partner: Not on file  . Emotionally Abused: Not on file  . Physically Abused: Not on file  . Sexually Abused: Not on file   Review of Systems  Constitutional: Negative.   HENT: Negative.   Eyes: Negative.   Respiratory: Negative.   Cardiovascular: Negative.  Negative for chest pain.  Gastrointestinal: Negative.   Genitourinary: Negative.   Musculoskeletal: Positive for back pain and joint pain.  Skin: Negative.   Neurological: Negative.   Endo/Heme/Allergies: Negative.   Psychiatric/Behavioral: Negative.     Physical Exam: not currently breastfeeding. Physical Exam Constitutional:      Appearance: Normal appearance.  HENT:     Head: Normocephalic.     Nose: Nose normal.     Mouth/Throat:     Mouth: Mucous membranes are moist.     Pharynx: Oropharynx is clear.  Eyes:     Pupils: Pupils are equal, round, and reactive to light.  Cardiovascular:     Rate and Rhythm: Normal rate and  regular rhythm.     Pulses: Normal pulses.  Pulmonary:     Effort: Pulmonary effort is normal.     Breath sounds: Normal breath sounds.  Abdominal:     General: Abdomen is flat. Bowel sounds are normal.  Skin:    General: Skin is warm.  Neurological:     General: No focal deficit present.     Mental Status: She is alert. Mental status is at baseline.  Psychiatric:        Mood and Affect: Mood normal.        Behavior: Behavior normal.  Thought Content: Thought content normal.        Judgment: Judgment normal.      Lab results: Results for orders placed or performed during the hospital encounter of 11/07/19 (from the past 24 hour(s))  Comprehensive metabolic panel     Status: Abnormal   Collection Time: 11/06/19  7:44 PM  Result Value Ref Range   Sodium 142 135 - 145 mmol/L   Potassium 3.9 3.5 - 5.1 mmol/L   Chloride 109 98 - 111 mmol/L   CO2 24 22 - 32 mmol/L   Glucose, Bld 96 70 - 99 mg/dL   BUN 10 6 - 20 mg/dL   Creatinine, Ser 0.51 0.44 - 1.00 mg/dL   Calcium 9.4 8.9 - 10.3 mg/dL   Total Protein 8.6 (H) 6.5 - 8.1 g/dL   Albumin 4.7 3.5 - 5.0 g/dL   AST 21 15 - 41 U/L   ALT 19 0 - 44 U/L   Alkaline Phosphatase 63 38 - 126 U/L   Total Bilirubin 1.2 0.3 - 1.2 mg/dL   GFR calc non Af Amer >60 >60 mL/min   GFR calc Af Amer >60 >60 mL/min   Anion gap 9 5 - 15  CBC with Differential     Status: Abnormal   Collection Time: 11/06/19  7:44 PM  Result Value Ref Range   WBC 8.2 4.0 - 10.5 K/uL   RBC 3.64 (L) 3.87 - 5.11 MIL/uL   Hemoglobin 11.0 (L) 12.0 - 15.0 g/dL   HCT 31.2 (L) 36.0 - 46.0 %   MCV 85.7 80.0 - 100.0 fL   MCH 30.2 26.0 - 34.0 pg   MCHC 35.3 30.0 - 36.0 g/dL   RDW 14.5 11.5 - 15.5 %   Platelets 705 (H) 150 - 400 K/uL   nRBC 0.6 (H) 0.0 - 0.2 %   Neutrophils Relative % 45 %   Neutro Abs 3.8 1.7 - 7.7 K/uL   Lymphocytes Relative 40 %   Lymphs Abs 3.3 0.7 - 4.0 K/uL   Monocytes Relative 11 %   Monocytes Absolute 0.9 0.1 - 1.0 K/uL   Eosinophils  Relative 3 %   Eosinophils Absolute 0.2 0.0 - 0.5 K/uL   Basophils Relative 1 %   Basophils Absolute 0.1 0.0 - 0.1 K/uL   Immature Granulocytes 0 %   Abs Immature Granulocytes 0.03 0.00 - 0.07 K/uL  Reticulocytes     Status: Abnormal   Collection Time: 11/06/19  7:44 PM  Result Value Ref Range   Retic Ct Pct 4.5 (H) 0.4 - 3.1 %   RBC. 3.69 (L) 3.87 - 5.11 MIL/uL   Retic Count, Absolute 164.9 19.0 - 186.0 K/uL   Immature Retic Fract 26.4 (H) 2.3 - 15.9 %  I-Stat beta hCG blood, ED     Status: None   Collection Time: 11/06/19  7:48 PM  Result Value Ref Range   I-stat hCG, quantitative <5.0 <5 mIU/mL   Comment 3            Imaging results:  No results found.   Assessment & Plan: Patient admitted to sickle cell day infusion center for management of pain crisis.  Patient is opiate tolerant.  Initiate IV dilaudid PCA. Settings of 0.6 mg, 10 minute lockout, and 4 mg per hour IV fluids, 0.45% saline at 100 ml/hr Toradol 15 mg IV times one dose Tylenol 1000 mg by mouth times one dose Reviewed CBC with differential, complete metabolic panel, and reticulocytes from 11/06/2019, do not warrant  repeating at this time. Consistent with patient's baseline.  Pain intensity will be reevaluated in context of functioning and relationship to baseline as care progress If pain intensity remains elevated and/or sudden change in hemodynamic stability transition to inpatient services for higher level of care.       [name]  APRN, MSN, FNP-C [facility] [address] [place], [place] [address] [phone]  11/07/2019, 10:45 AM

## 2019-11-07 NOTE — Telephone Encounter (Signed)
Patient called, complained of generalized pain rated at 10/10. Denied chest pain, fever, diarrhea, abdominal pain, nausea/vomitting. Screened negative for Covid-19 symptoms. Admitted to having means of transportation without driving self after treatment. Last took sub Q Dilaudid at 22:30 on 11/06/2019. Per provider, patient can come to the day hospital for treatment. Patient notified, verbalized understanding.

## 2019-11-07 NOTE — Progress Notes (Signed)
Patient admitted to the day hospital for treatment of sickle cell pain crisis. Patient reported generalized pain rated 10/10. Patient placed on Dilaudid PCA, given IV zofran, IV Toradol  and hydrated with IV fluids. At discharge patient reported  pain at 5/10. Discharge instructions given to patient. Patient alert, oriented and ambulatory at discharge.

## 2019-11-07 NOTE — Discharge Instructions (Signed)
Sickle Cell Anemia, Adult  Sickle cell anemia is a condition where your red blood cells are shaped like sickles. Red blood cells carry oxygen through the body. Sickle-shaped cells do not live as long as normal red blood cells. They also clump together and block blood from flowing through the blood vessels. This prevents the body from getting enough oxygen. Sickle cell anemia causes organ damage and pain. It also increases the risk of infection. Follow these instructions at home: Medicines  Take over-the-counter and prescription medicines only as told by your doctor.  If you were prescribed an antibiotic medicine, take it as told by your doctor. Do not stop taking the antibiotic even if you start to feel better.  If you develop a fever, do not take medicines to lower the fever right away. Tell your doctor about the fever. Managing pain, stiffness, and swelling  Try these methods to help with pain: ? Use a heating pad. ? Take a warm bath. ? Distract yourself, such as by watching TV. Eating and drinking  Drink enough fluid to keep your pee (urine) clear or pale yellow. Drink more in hot weather and during exercise.  Limit or avoid alcohol.  Eat a healthy diet. Eat plenty of fruits, vegetables, whole grains, and lean protein.  Take vitamins and supplements as told by your doctor. Traveling  When traveling, keep these with you: ? Your medical information. ? The names of your doctors. ? Your medicines.  If you need to take an airplane, talk to your doctor first. Activity  Rest often.  Avoid exercises that make your heart beat much faster, such as jogging. General instructions  Do not use products that have nicotine or tobacco, such as cigarettes and e-cigarettes. If you need help quitting, ask your doctor.  Consider wearing a medical alert bracelet.  Avoid being in high places (high altitudes), such as mountains.  Avoid very hot or cold temperatures.  Avoid places where the  temperature changes a lot.  Keep all follow-up visits as told by your doctor. This is important. Contact a doctor if:  A joint hurts.  Your feet or hands hurt or swell.  You feel tired (fatigued). Get help right away if:  You have symptoms of infection. These include: ? Fever. ? Chills. ? Being very tired. ? Irritability. ? Poor eating. ? Throwing up (vomiting).  You feel dizzy or faint.  You have new stomach pain, especially on the left side.  You have a an erection (priapism) that lasts more than 4 hours.  You have numbness in your arms or legs.  You have a hard time moving your arms or legs.  You have trouble talking.  You have pain that does not go away when you take medicine.  You are short of breath.  You are breathing fast.  You have a long-term cough.  You have pain in your chest.  You have a bad headache.  You have a stiff neck.  Your stomach looks bloated even though you did not eat much.  Your skin is pale.  You suddenly cannot see well. Summary  Sickle cell anemia is a condition where your red blood cells are shaped like sickles.  Follow your doctor's advice on ways to manage pain, food to eat, activities to do, and steps to take for safe travel.  Get medical help right away if you have any signs of infection, such as a fever. This information is not intended to replace advice given to you by   your health care provider. Make sure you discuss any questions you have with your health care provider. Document Revised: 02/09/2019 Document Reviewed: 11/23/2016 Elsevier Patient Education  2020 [employer] Inc.  

## 2019-11-07 NOTE — Discharge Summary (Signed)
[facility] Discharge Summary   Patient ID: Carmen [name] MRN: [id number] DOB/AGE: [birth year] 29 y.o.  Admit date: 11/07/2019 Discharge date: 11/08/2019  Primary Care Physician:  Lanae Boast, [place]  Admission Diagnoses:  Active Problems:   Sickle cell pain crisis [facility])   Discharge Medications:  Allergies as of 11/07/2019   No Known Allergies     Medication List    TAKE these medications   aspirin EC 81 MG tablet Take 1 tablet (81 mg total) by mouth daily.   folic acid 1 MG tablet Commonly known as: FOLVITE Take 1 tablet (1 mg total) by mouth daily.   ibuprofen 800 MG tablet Commonly known as: ADVIL Take 1 tablet (800 mg total) by mouth 3 (three) times daily with meals as needed.   morphine 60 MG 12 hr tablet Commonly known as: MS Contin Take 1 tablet (60 mg total) by mouth every 12 (twelve) hours.   Oxycodone HCl 10 MG Tabs Take 1 tablet (10 mg total) by mouth every 6 (six) hours as needed for up to 15 days (PAIN).   PrePLUS 27-1 MG Tabs Take 1 tablet by mouth daily.   Vitamin D (Ergocalciferol) 1.25 MG (50000 UT) Caps capsule Commonly known as: DRISDOL TAKE ONE CAPSULE BY MOUTH ONE TIME PER WEEK What changed: See the new instructions.        Consults:  None  Significant Diagnostic Studies:  No results found.  History of present illness: Carmen [name], a 29 year old female with a medical history significant for sickle cell disease, chronic pain syndrome, opiate dependence and tolerance, and history of anemia of chronic disease. Patient attributes pain crisis to running out of opiate medications. She says that pain has been uncontrolled. Carmen [name] has been treated and evaluated in the ER over the past several days without complete resolution of this problem. Patient's pain intensity is 9/10, mainly in low back and lower extremities. Carmen [name] says that she has been unable to get comfortable. She characterizes pain as constant and aching. She denies headache,  chest pain, shortness of breath, dizziness, urinary symptoms, nausea, vomiting, or diarrhea. No sick contacts, recent travel, or exposure to COVID-19.    [facility] Course:  Patient admitted to sickle cell day infusion center for management of pain crisis.  Reviewed all laboratory values, consistent with baseline.  Pain managed with IV dilaudid via PCA with settings of 0.6 mg, 10 minute lockout, and 6 mg per hour. Toradol 15 mg IV times 1 dose Tylenol 1000 mg times 1 dose 0.45% saline at 100 ml/hr Pain intensity decreased to 3/10. Patient does not warrant inpatient admission. Advised to follow up with PCP as scheduled. Also, medications are available at pharmacy.  Patient alert, oriented and ambulating without assistance.  Patient will be discharged home in a hemodynamically stable condition.   Discharge instructions:  Resume all home medications.   Follow up with PCP as previously  scheduled.   Discussed the importance of drinking 64 ounces of water daily, dehydration of red blood cells may lead further sickling.   Avoid all stressors that precipitate sickle cell pain crisis.     The patient was given clear instructions to go to ER or return to medical center if symptoms do not improve, worsen or new problems develop.    Physical Exam at Discharge:  BP 124/81 (BP Location: Right Arm)   Pulse 87   Resp 15   SpO2 98%  Physical Exam Constitutional:  Appearance: Normal appearance.  HENT:     Nose: Nose normal.     Mouth/Throat:     Mouth: Mucous membranes are moist.     Pharynx: Oropharynx is clear.  Eyes:     Pupils: Pupils are equal, round, and reactive to light.  Cardiovascular:     Rate and Rhythm: Normal rate and regular rhythm.     Pulses: Normal pulses.     Heart sounds: Normal heart sounds.  Pulmonary:     Effort: Pulmonary effort is normal.     Breath sounds: Normal breath sounds.  Abdominal:     General: Abdomen is flat. Bowel sounds are  normal.  Musculoskeletal:        General: Normal range of motion.  Skin:    General: Skin is warm.  Neurological:     General: No focal deficit present.     Mental Status: She is alert and oriented to person, place, and time. Mental status is at baseline.  Psychiatric:        Mood and Affect: Mood normal.        Behavior: Behavior normal.        Thought Content: Thought content normal.        Judgment: Judgment normal.       Disposition at Discharge: Discharge disposition: 01-Home or Self Care       Discharge Orders: Discharge Instructions    Discharge patient   Complete by: As directed    Discharge disposition: 01-Home or Self Care   Discharge patient date: 11/07/2019      Condition at Discharge:   Stable  Time spent on Discharge:  Greater than 30 minutes.  Signed: [name]  APRN, MSN, FNP-C [facility] [address] [place], [place] [address] [phone]  11/08/2019, 5:54 AM

## 2019-11-07 NOTE — Progress Notes (Signed)
Dilaudid PCA waste 21 mg. Co-signed by Kriste Basque RN

## 2019-11-07 NOTE — Progress Notes (Signed)
Dilaudid PCA waste 21 mg. Co signed by Kriste Basque RN

## 2019-11-28 ENCOUNTER — Telehealth: Payer: Self-pay | Admitting: Family Medicine

## 2019-11-29 ENCOUNTER — Other Ambulatory Visit: Payer: Self-pay | Admitting: Family Medicine

## 2019-11-29 DIAGNOSIS — Z79891 Long term (current) use of opiate analgesic: Secondary | ICD-10-CM

## 2019-11-29 DIAGNOSIS — D571 Sickle-cell disease without crisis: Secondary | ICD-10-CM

## 2019-11-29 MED ORDER — OXYCODONE HCL 10 MG PO TABS: 10.0000 mg | ORAL_TABLET | Freq: Four times a day (QID) | ORAL | 0 refills | Status: DC | PRN

## 2019-11-29 NOTE — Telephone Encounter (Signed)
done

## 2019-12-04 ENCOUNTER — Other Ambulatory Visit: Payer: Self-pay

## 2019-12-04 ENCOUNTER — Ambulatory Visit (INDEPENDENT_AMBULATORY_CARE_PROVIDER_SITE_OTHER): Payer: Medicaid Other | Admitting: Family Medicine

## 2019-12-04 ENCOUNTER — Encounter: Payer: Self-pay | Admitting: Family Medicine

## 2019-12-04 DIAGNOSIS — M545 Low back pain, unspecified: Secondary | ICD-10-CM

## 2019-12-04 DIAGNOSIS — D571 Sickle-cell disease without crisis: Secondary | ICD-10-CM

## 2019-12-04 DIAGNOSIS — Z79891 Long term (current) use of opiate analgesic: Secondary | ICD-10-CM | POA: Diagnosis not present

## 2019-12-04 DIAGNOSIS — M7918 Myalgia, other site: Secondary | ICD-10-CM

## 2019-12-04 DIAGNOSIS — Z09 Encounter for follow-up examination after completed treatment for conditions other than malignant neoplasm: Secondary | ICD-10-CM

## 2019-12-04 DIAGNOSIS — G8929 Other chronic pain: Secondary | ICD-10-CM

## 2019-12-04 MED ORDER — CYCLOBENZAPRINE HCL 10 MG PO TABS: 10.0000 mg | ORAL_TABLET | Freq: Two times a day (BID) | ORAL | 3 refills | Status: DC

## 2019-12-04 MED ORDER — ACETAMINOPHEN 500 MG PO TABS: 1000.0000 mg | ORAL_TABLET | Freq: Two times a day (BID) | ORAL | 0 refills | Status: DC | PRN

## 2019-12-04 MED ORDER — MORPHINE SULFATE ER 60 MG PO TBCR: 60.0000 mg | EXTENDED_RELEASE_TABLET | Freq: Two times a day (BID) | ORAL | 0 refills | Status: DC

## 2019-12-04 NOTE — Progress Notes (Signed)
Virtual Visit via Telephone Note  I connected with Carmen [name] on 12/04/19 at 11:20 AM EST by telephone and verified that I am speaking with the correct person using two identifiers.   I discussed the limitations, risks, security and privacy concerns of performing an evaluation and management service by telephone and the availability of in person appointments. I also discussed with the patient that there may be a patient responsible charge related to this service. The patient expressed understanding and agreed to proceed.   History of Present Illness:  Past Medical History:  Diagnosis Date  . Headache(784.0)   . Sickle cell crisis ([place])   . Syphilis 2015   Was diagnosed and received one injection of antibiotics  . Thrombocytosis ([place]) 11/22/2014     CBC Latest Ref Rng & Units 12/13/2018 12/11/2018 12/10/2018 WBC 4.0 - 10.5 K/uL 14.8(H) 13.2(H) 15.9(H) Hemoglobin 12.0 - 15.0 g/dL 8.2(L) 7.7(L) 8.1(L) Hematocrit 36.0 - 46.0 % 23.7(L) 23.2(L) 24.5(L) Platelets 150 - 400 K/uL 326 399 388      Past Surgical History:  Procedure Laterality Date  . CESAREAN SECTION N/A 02/05/2019   Procedure: CESAREAN SECTION;  Surgeon: [name], MD;  [facility];  Service: Obstetrics;  Laterality: N/A;  . CHOLECYSTECTOMY N/A 11/30/2014   Procedure: LAPAROSCOPIC CHOLECYSTECTOMY SINGLE SITE WITH INTRAOPERATIVE CHOLANGIOGRAM;  Surgeon: Michael Boston, MD;  Location: [facility];  Service: General;  Laterality: N/A;  . SPLENECTOMY      Social History   Socioeconomic History  . Marital status: Single    Spouse name: Not on file  . Number of children: Not on file  . Years of education: Not on file  . Highest education level: Not on file  Occupational History  . Not on file  Tobacco Use  . Smoking status: Current Some Day Smoker    Types: Cigarettes  . Smokeless tobacco: Never Used  Substance and Sexual Activity  . Alcohol use: No  . Drug use: No  . Sexual activity: Yes    Birth  control/protection: Injection  Other Topics Concern  . Not on file  Social History Narrative  . Not on file   Social Determinants of Health   Financial Resource Strain:   . Difficulty of Paying Living Expenses: Not on file  Food Insecurity:   . Worried About Charity fundraiser in the Last Year: Not on file  . Ran Out of Food in the Last Year: Not on file  Transportation Needs:   . Lack of Transportation (Medical): Not on file  . Lack of Transportation (Non-Medical): Not on file  Physical Activity:   . Days of Exercise per Week: Not on file  . Minutes of Exercise per Session: Not on file  Stress:   . Feeling of Stress : Not on file  Social Connections:   . Frequency of Communication with Friends and Family: Not on file  . Frequency of Social Gatherings with Friends and Family: Not on file  . Attends Religious Services: Not on file  . Active Member of Clubs or Organizations: Not on file  . Attends Archivist Meetings: Not on file  . Marital Status: Not on file  Intimate Partner Violence:   . Fear of Current or Ex-Partner: Not on file  . Emotionally Abused: Not on file  . Physically Abused: Not on file  . Sexually Abused: Not on file    Family History  Problem Relation Age of Onset  . Hypertension Mother   .  Sickle cell anemia Sister   . Kidney disease Sister        Lupus  . Arthritis Sister   . Sickle cell anemia Sister   . Sickle cell trait Sister   . Heart disease Maternal Aunt        CABG  . Heart disease Maternal Uncle        CABG  . Lupus Sister     No Known Allergies  Current Outpatient Medications on File Prior to Visit  Medication Sig Dispense Refill  . aspirin EC 81 MG tablet Take 1 tablet (81 mg total) by mouth daily. 90 tablet 3  . folic acid (FOLVITE) 1 MG tablet Take 1 tablet (1 mg total) by mouth daily. 90 tablet 3  . ibuprofen (ADVIL) 800 MG tablet Take 1 tablet (800 mg total) by mouth 3 (three) times daily with meals as needed. 30  tablet 1  . morphine (MS CONTIN) 60 MG 12 hr tablet Take 1 tablet (60 mg total) by mouth every 12 (twelve) hours. 60 tablet 0  . Oxycodone HCl 10 MG TABS Take 1 tablet (10 mg total) by mouth every 6 (six) hours as needed for up to 15 days (PAIN). 60 tablet 0  . Prenatal Vit-Fe Fumarate-FA (PREPLUS) 27-1 MG TABS Take 1 tablet by mouth daily. 30 tablet 13  . Vitamin D, Ergocalciferol, (DRISDOL) 1.25 MG (50000 UT) CAPS capsule TAKE ONE CAPSULE BY MOUTH ONE TIME PER WEEK (Patient taking differently: Take 50,000 Units by mouth every 7 (seven) days. ) 30 capsule 1   No current facility-administered medications on file prior to visit.    Current Status: Carmen [name] is a former patient of Lanae Boast, NP. Since her last office visit, she is doing well with no complaints. She states that she has pain in her lower back and legs. She rates her pain today at 7/10. She has not had a hospital visit for Sickle Cell Crisis since 11/07/2019 where she was treated and discharged the same day. She is currently taking all medications as prescribed and staying well hydrated. She reports occasional nausea, constipation, dizziness and headaches. She states that she was not able to come into office today because of transportation issues. She states that her anxiety is increased r/t pain, weather changes and a new born baby. She denies suicidal ideations, homicidal ideations, or auditory hallucinations. She denies fevers, chills, fatigue, recent infections, weight loss, and night sweats. She has not had any visual changes, and falls. No chest pain, heart palpitations, cough and shortness of breath reported. No reports of GI problems such as vomiting, and diarrhea. She has no reports of blood in stools, dysuria and hematuria.   Observations/Objective:  Telephone Virtual Visit.    Assessment and Plan:  1. Hb-SS disease without crisis [facility]) She is doing well today. She will continue to take pain medications as prescribed;  will continue to avoid extreme heat and cold; will continue to eat a healthy diet and drink at least 64 ounces of water daily; continue stool softener as needed; will avoid colds and flu; will continue to get plenty of sleep and rest; will continue to avoid high stressful situations and remain infection free; will continue Folic Acid 1 mg daily to avoid sickle cell crisis. Continue to follow up with Hematologist as needed.   2. Chronic low back pain without sciatica, unspecified back pain laterality - acetaminophen (TYLENOL) 500 MG tablet; Take 2 tablets (1,000 mg total) by mouth 2 (two) times daily as  needed.  Dispense: 30 tablet; Refill: 0 - cyclobenzaprine (FLEXERIL) 10 MG tablet; Take 1 tablet (10 mg total) by mouth 2 (two) times daily.  Dispense: 60 tablet; Refill: 3  3. Chronic prescription opiate use  4. Chronic musculoskeletal pain  5. Follow up She will follow up in 2 months with Dionisio David, NP.   Meds ordered this encounter  Medications  . acetaminophen (TYLENOL) 500 MG tablet    Sig: Take 2 tablets (1,000 mg total) by mouth 2 (two) times daily as needed.    Dispense:  30 tablet    Refill:  0  . cyclobenzaprine (FLEXERIL) 10 MG tablet    Sig: Take 1 tablet (10 mg total) by mouth 2 (two) times daily.    Dispense:  60 tablet    Refill:  3  . morphine (MS CONTIN) 60 MG 12 hr tablet    Sig: Take 1 tablet (60 mg total) by mouth every 12 (twelve) hours.    Dispense:  60 tablet    Refill:  0    Please do not refill this medication prior to 12/07/2019. Thank you.    Order Specific Question:   Supervising Provider    Answer:   Tresa Garter [id number]    No orders of the defined types were placed in this encounter.   Referral Orders  No referral(s) requested today    Kathe Becton,  MSN, FNP-BC [place] [address] [place], [place] [address] [phone] fax     I discussed  the assessment and treatment plan with the patient. The patient was provided an opportunity to ask questions and all were answered. The patient agreed with the plan and demonstrated an understanding of the instructions.   The patient was advised to call back or seek an in-person evaluation if the symptoms worsen or if the condition fails to improve as anticipated.  I provided 15 minutes of non-face-to-face time during this encounter.   Azzie Glatter, FNP

## 2019-12-06 ENCOUNTER — Telehealth: Payer: Self-pay | Admitting: Family Medicine

## 2019-12-06 ENCOUNTER — Encounter (HOSPITAL_COMMUNITY): Payer: Self-pay | Admitting: Family Medicine

## 2019-12-06 ENCOUNTER — Emergency Department (HOSPITAL_COMMUNITY)
Admission: EM | Admit: 2019-12-06 | Discharge: 2019-12-06 | Disposition: A | Discharge status: Home / Self Care | Payer: Medicaid Other | Attending: Emergency Medicine | Admitting: Emergency Medicine

## 2019-12-06 ENCOUNTER — Encounter (HOSPITAL_COMMUNITY): Payer: Self-pay | Admitting: Emergency Medicine

## 2019-12-06 ENCOUNTER — Non-Acute Institutional Stay (HOSPITAL_BASED_OUTPATIENT_CLINIC_OR_DEPARTMENT_OTHER)
Admission: AD | Admit: 2019-12-06 | Discharge: 2019-12-06 | Disposition: A | Discharge status: Home / Self Care | Payer: Medicaid Other | Source: Ambulatory Visit | Attending: Internal Medicine | Admitting: Internal Medicine

## 2019-12-06 ENCOUNTER — Other Ambulatory Visit: Payer: Self-pay | Admitting: Family Medicine

## 2019-12-06 ENCOUNTER — Other Ambulatory Visit: Payer: Self-pay

## 2019-12-06 DIAGNOSIS — Z8489 Family history of other specified conditions: Secondary | ICD-10-CM | POA: Insufficient documentation

## 2019-12-06 DIAGNOSIS — Z9049 Acquired absence of other specified parts of digestive tract: Secondary | ICD-10-CM | POA: Insufficient documentation

## 2019-12-06 DIAGNOSIS — Z7982 Long term (current) use of aspirin: Secondary | ICD-10-CM | POA: Insufficient documentation

## 2019-12-06 DIAGNOSIS — D571 Sickle-cell disease without crisis: Secondary | ICD-10-CM | POA: Insufficient documentation

## 2019-12-06 DIAGNOSIS — D5700 Hb-SS disease with crisis, unspecified: Secondary | ICD-10-CM | POA: Diagnosis present

## 2019-12-06 DIAGNOSIS — G894 Chronic pain syndrome: Secondary | ICD-10-CM | POA: Insufficient documentation

## 2019-12-06 DIAGNOSIS — M79605 Pain in left leg: Secondary | ICD-10-CM | POA: Diagnosis not present

## 2019-12-06 DIAGNOSIS — Z79899 Other long term (current) drug therapy: Secondary | ICD-10-CM | POA: Diagnosis not present

## 2019-12-06 DIAGNOSIS — D638 Anemia in other chronic diseases classified elsewhere: Secondary | ICD-10-CM | POA: Insufficient documentation

## 2019-12-06 DIAGNOSIS — M5489 Other dorsalgia: Secondary | ICD-10-CM | POA: Insufficient documentation

## 2019-12-06 DIAGNOSIS — Z79891 Long term (current) use of opiate analgesic: Secondary | ICD-10-CM

## 2019-12-06 DIAGNOSIS — M79604 Pain in right leg: Secondary | ICD-10-CM | POA: Insufficient documentation

## 2019-12-06 DIAGNOSIS — F17210 Nicotine dependence, cigarettes, uncomplicated: Secondary | ICD-10-CM | POA: Insufficient documentation

## 2019-12-06 DIAGNOSIS — Z8261 Family history of arthritis: Secondary | ICD-10-CM | POA: Insufficient documentation

## 2019-12-06 DIAGNOSIS — Z791 Long term (current) use of non-steroidal anti-inflammatories (NSAID): Secondary | ICD-10-CM | POA: Insufficient documentation

## 2019-12-06 DIAGNOSIS — Z9081 Acquired absence of spleen: Secondary | ICD-10-CM | POA: Insufficient documentation

## 2019-12-06 DIAGNOSIS — F1120 Opioid dependence, uncomplicated: Secondary | ICD-10-CM | POA: Insufficient documentation

## 2019-12-06 DIAGNOSIS — Z8249 Family history of ischemic heart disease and other diseases of the circulatory system: Secondary | ICD-10-CM | POA: Insufficient documentation

## 2019-12-06 DIAGNOSIS — Z832 Family history of diseases of the blood and blood-forming organs and certain disorders involving the immune mechanism: Secondary | ICD-10-CM | POA: Insufficient documentation

## 2019-12-06 LAB — CBC WITH DIFFERENTIAL/PLATELET
Abs Immature Granulocytes: 0.05 10*3/uL (ref 0.00–0.07)
Basophils Absolute: 0 10*3/uL (ref 0.0–0.1)
Basophils Relative: 0 %
Eosinophils Absolute: 0.2 10*3/uL (ref 0.0–0.5)
Eosinophils Relative: 3 %
HCT: 30.7 % — ABNORMAL LOW (ref 36.0–46.0)
Hemoglobin: 10.9 g/dL — ABNORMAL LOW (ref 12.0–15.0)
Immature Granulocytes: 1 %
Lymphocytes Relative: 34 %
Lymphs Abs: 3 10*3/uL (ref 0.7–4.0)
MCH: 29.6 pg (ref 26.0–34.0)
MCHC: 35.5 g/dL (ref 30.0–36.0)
MCV: 83.4 fL (ref 80.0–100.0)
Monocytes Absolute: 0.9 10*3/uL (ref 0.1–1.0)
Monocytes Relative: 10 %
Neutro Abs: 4.7 10*3/uL (ref 1.7–7.7)
Neutrophils Relative %: 52 %
Platelets: 546 10*3/uL — ABNORMAL HIGH (ref 150–400)
RBC: 3.68 MIL/uL — ABNORMAL LOW (ref 3.87–5.11)
RDW: 14 % (ref 11.5–15.5)
WBC: 9 10*3/uL (ref 4.0–10.5)
nRBC: 0.3 % — ABNORMAL HIGH (ref 0.0–0.2)

## 2019-12-06 LAB — RETICULOCYTES
Immature Retic Fract: 36 % — ABNORMAL HIGH (ref 2.3–15.9)
RBC.: 3.71 MIL/uL — ABNORMAL LOW (ref 3.87–5.11)
Retic Count, Absolute: 205.2 10*3/uL — ABNORMAL HIGH (ref 19.0–186.0)
Retic Ct Pct: 5.5 % — ABNORMAL HIGH (ref 0.4–3.1)

## 2019-12-06 LAB — COMPREHENSIVE METABOLIC PANEL
ALT: 24 U/L (ref 0–44)
AST: 28 U/L (ref 15–41)
Albumin: 4.2 g/dL (ref 3.5–5.0)
Alkaline Phosphatase: 55 U/L (ref 38–126)
Anion gap: 7 (ref 5–15)
BUN: 7 mg/dL (ref 6–20)
CO2: 25 mmol/L (ref 22–32)
Calcium: 9.2 mg/dL (ref 8.9–10.3)
Chloride: 106 mmol/L (ref 98–111)
Creatinine, Ser: 0.47 mg/dL (ref 0.44–1.00)
GFR calc Af Amer: >60 mL/min (ref >60)
GFR calc non Af Amer: >60 mL/min (ref >60)
Glucose, Bld: 94 mg/dL (ref 70–99)
Potassium: 3.9 mmol/L (ref 3.5–5.1)
Sodium: 138 mmol/L (ref 135–145)
Total Bilirubin: 1.1 mg/dL (ref 0.3–1.2)
Total Protein: 7.9 g/dL (ref 6.5–8.1)

## 2019-12-06 LAB — PREGNANCY, URINE: Preg Test, Ur: NEGATIVE

## 2019-12-06 MED ORDER — NALOXONE HCL 0.4 MG/ML IJ SOLN: 0.4000 mg | INTRAMUSCULAR | Status: DC | PRN

## 2019-12-06 MED ORDER — DIPHENHYDRAMINE HCL 25 MG PO CAPS: 25.0000 mg | ORAL_CAPSULE | ORAL | Status: DC | PRN

## 2019-12-06 MED ORDER — ONDANSETRON HCL 4 MG/2ML IJ SOLN: 4.0000 mg | Freq: Four times a day (QID) | INTRAMUSCULAR | Status: DC | PRN

## 2019-12-06 MED ORDER — HYDROMORPHONE 1 MG/ML IV SOLN
INTRAVENOUS | Status: DC
  Administered 2019-12-06: 30 mg via INTRAVENOUS
  Administered 2019-12-06: 12 mg via INTRAVENOUS
  Filled 2019-12-06: qty 30

## 2019-12-06 MED ORDER — OXYCODONE HCL 5 MG PO TABS
10.0000 mg | ORAL_TABLET | Freq: Once | ORAL | Status: AC
  Administered 2019-12-06: 10 mg via ORAL
  Filled 2019-12-06: qty 2

## 2019-12-06 MED ORDER — SODIUM CHLORIDE 0.45 % IV SOLN: INTRAVENOUS | Status: DC

## 2019-12-06 MED ORDER — KETOROLAC TROMETHAMINE 30 MG/ML IJ SOLN
15.0000 mg | Freq: Once | INTRAMUSCULAR | Status: AC
  Administered 2019-12-06: 15 mg via INTRAVENOUS
  Filled 2019-12-06: qty 1

## 2019-12-06 MED ORDER — SODIUM CHLORIDE 0.9% FLUSH: 9.0000 mL | INTRAVENOUS | Status: DC | PRN

## 2019-12-06 MED ORDER — ACETAMINOPHEN 500 MG PO TABS
1000.0000 mg | ORAL_TABLET | Freq: Once | ORAL | Status: AC
  Administered 2019-12-06: 1000 mg via ORAL
  Filled 2019-12-06: qty 2

## 2019-12-06 NOTE — Discharge Instructions (Signed)
Sickle Cell Anemia, Adult  Sickle cell anemia is a condition where your red blood cells are shaped like sickles. Red blood cells carry oxygen through the body. Sickle-shaped cells do not live as long as normal red blood cells. They also clump together and block blood from flowing through the blood vessels. This prevents the body from getting enough oxygen. Sickle cell anemia causes organ damage and pain. It also increases the risk of infection. Follow these instructions at home: Medicines  Take over-the-counter and prescription medicines only as told by your doctor.  If you were prescribed an antibiotic medicine, take it as told by your doctor. Do not stop taking the antibiotic even if you start to feel better.  If you develop a fever, do not take medicines to lower the fever right away. Tell your doctor about the fever. Managing pain, stiffness, and swelling  Try these methods to help with pain: ? Use a heating pad. ? Take a warm bath. ? Distract yourself, such as by watching TV. Eating and drinking  Drink enough fluid to keep your pee (urine) clear or pale yellow. Drink more in hot weather and during exercise.  Limit or avoid alcohol.  Eat a healthy diet. Eat plenty of fruits, vegetables, whole grains, and lean protein.  Take vitamins and supplements as told by your doctor. Traveling  When traveling, keep these with you: ? Your medical information. ? The names of your doctors. ? Your medicines.  If you need to take an airplane, talk to your doctor first. Activity  Rest often.  Avoid exercises that make your heart beat much faster, such as jogging. General instructions  Do not use products that have nicotine or tobacco, such as cigarettes and e-cigarettes. If you need help quitting, ask your doctor.  Consider wearing a medical alert bracelet.  Avoid being in high places (high altitudes), such as mountains.  Avoid very hot or cold temperatures.  Avoid places where the  temperature changes a lot.  Keep all follow-up visits as told by your doctor. This is important. Contact a doctor if:  A joint hurts.  Your feet or hands hurt or swell.  You feel tired (fatigued). Get help right away if:  You have symptoms of infection. These include: ? Fever. ? Chills. ? Being very tired. ? Irritability. ? Poor eating. ? Throwing up (vomiting).  You feel dizzy or faint.  You have new stomach pain, especially on the left side.  You have a an erection (priapism) that lasts more than 4 hours.  You have numbness in your arms or legs.  You have a hard time moving your arms or legs.  You have trouble talking.  You have pain that does not go away when you take medicine.  You are short of breath.  You are breathing fast.  You have a long-term cough.  You have pain in your chest.  You have a bad headache.  You have a stiff neck.  Your stomach looks bloated even though you did not eat much.  Your skin is pale.  You suddenly cannot see well. Summary  Sickle cell anemia is a condition where your red blood cells are shaped like sickles.  Follow your doctor's advice on ways to manage pain, food to eat, activities to do, and steps to take for safe travel.  Get medical help right away if you have any signs of infection, such as a fever. This information is not intended to replace advice given to you by   your health care provider. Make sure you discuss any questions you have with your health care provider. Document Revised: 02/09/2019 Document Reviewed: 11/23/2016 Elsevier Patient Education  2020 [employer] Inc.  

## 2019-12-06 NOTE — Discharge Instructions (Signed)
Go to the clinic now!  They have a place for you

## 2019-12-06 NOTE — ED Notes (Signed)
Report given to RN at sickle cell clinic.

## 2019-12-06 NOTE — H&P (Signed)
[facility] History and Physical   Date: 12/07/2019  Patient name: Carmen [name] Medical record number: [id number] Date of birth: 09-04-1991 Age: 28 y.o. Gender: female PCP: Lanae Boast, [place]  Attending physician: No att. providers found  Chief Complaint: Sickle cell pain  History of Present Illness: Carmen [name], a42 year old female with a medical history significant for sickle cell disease, chronic pain syndrome, opiate dependence and tolerance, and anemia of chronic disease presents complaining of generalized pain that is consistent with her pain crisis.  Pain is primarily to low back, upper and lower extremities.  Pain has been elevated over the past several days and patient attributes crisis to changes in weather.  Patient was treated and evaluated in the emergency room 1 day prior and transition from the emergency department this a.m.  Agreed with ER provider that patient was appropriate for pain management and extended observation.  Current pain intensity is 9/10 characterized as constant and aching, occasionally sharp.  Patient denies fever, chills, chest pain, shortness of breath, urinary symptoms, nausea, vomiting, or diarrhea.  No sick contacts, recent travel, or exposure to COVID-19.   Meds: No medications prior to admission.    Allergies: Patient has no known allergies. Past Medical History:  Diagnosis Date  . Headache(784.0)   . Sickle cell crisis ([place])   . Syphilis 2015   Was diagnosed and received one injection of antibiotics  . Thrombocytosis ([place]) 11/22/2014     CBC Latest Ref Rng & Units 12/13/2018 12/11/2018 12/10/2018 WBC 4.0 - 10.5 K/uL 14.8(H) 13.2(H) 15.9(H) Hemoglobin 12.0 - 15.0 g/dL 8.2(L) 7.7(L) 8.1(L) Hematocrit 36.0 - 46.0 % 23.7(L) 23.2(L) 24.5(L) Platelets 150 - 400 K/uL 326 399 388     Past Surgical History:  Procedure Laterality Date  . CESAREAN SECTION N/A 02/05/2019   Procedure: CESAREAN SECTION;  Surgeon: [name], MD;  [facility];  Service: Obstetrics;  Laterality: N/A;  . CHOLECYSTECTOMY N/A 11/30/2014   Procedure: LAPAROSCOPIC CHOLECYSTECTOMY SINGLE SITE WITH INTRAOPERATIVE CHOLANGIOGRAM;  Surgeon: Michael Boston, MD;  Location: [facility] ORS;  Service: General;  Laterality: N/A;  . SPLENECTOMY     Family History  Problem Relation Age of Onset  . Hypertension Mother   . Sickle cell anemia Sister   . Kidney disease Sister        Lupus  . Arthritis Sister   . Sickle cell anemia Sister   . Sickle cell trait Sister   . Heart disease Maternal Aunt        CABG  . Heart disease Maternal Uncle        CABG  . Lupus Sister    Social History   Socioeconomic History  . Marital status: Single    Spouse name: Not on file  . Number of children: Not on file  . Years of education: Not on file  . Highest education level: Not on file  Occupational History  . Not on file  Tobacco Use  . Smoking status: Current Some Day Smoker    Types: Cigarettes  . Smokeless tobacco: Never Used  Substance and Sexual Activity  . Alcohol use: No  . Drug use: No  . Sexual activity: Yes    Birth control/protection: Injection  Other Topics Concern  . Not on file  Social History Narrative  . Not on file   Social Determinants of Health   Financial Resource Strain:   . Difficulty of Paying Living Expenses: Not on file  Food Insecurity:   .  Worried About Charity fundraiser in the Last Year: Not on file  . Ran Out of Food in the Last Year: Not on file  Transportation Needs:   . Lack of Transportation (Medical): Not on file  . Lack of Transportation (Non-Medical): Not on file  Physical Activity:   . Days of Exercise per Week: Not on file  . Minutes of Exercise per Session: Not on file  Stress:   . Feeling of Stress : Not on file  Social Connections:   . Frequency of Communication with Friends and Family: Not on file  . Frequency of Social Gatherings with Friends and Family: Not on file  . Attends Religious Services: Not on  file  . Active Member of Clubs or Organizations: Not on file  . Attends Archivist Meetings: Not on file  . Marital Status: Not on file  Intimate Partner Violence:   . Fear of Current or Ex-Partner: Not on file  . Emotionally Abused: Not on file  . Physically Abused: Not on file  . Sexually Abused: Not on file   Review of Systems  Constitutional: Negative for chills and fever.  HENT: Negative.   Eyes: Negative.   Respiratory: Negative.   Cardiovascular: Negative.   Gastrointestinal: Negative.   Genitourinary: Negative.   Musculoskeletal: Positive for back pain and joint pain.  Skin: Negative.   Neurological: Negative.   Psychiatric/Behavioral: Negative.     Physical Exam: Blood pressure (!) 116/54, pulse 65, temperature 98.5 F (36.9 C), temperature source Oral, resp. rate 14, height 5\' 3"  (1.6 m), weight 165 lb (74.8 kg), last menstrual period 11/08/2019, SpO2 99 %, not currently breastfeeding. Physical Exam Constitutional:      Appearance: Normal appearance.  HENT:     Mouth/Throat:     Mouth: Mucous membranes are moist.  Eyes:     Pupils: Pupils are equal, round, and reactive to light.  Cardiovascular:     Rate and Rhythm: Normal rate and regular rhythm.  Pulmonary:     Effort: Pulmonary effort is normal.     Breath sounds: Normal breath sounds.  Abdominal:     General: Bowel sounds are normal.     Palpations: Abdomen is soft.  Musculoskeletal:     Cervical back: Normal range of motion.  Skin:    General: Skin is warm.  Neurological:     General: No focal deficit present.     Mental Status: She is alert. Mental status is at baseline.  Psychiatric:        Mood and Affect: Mood normal.        Thought Content: Thought content normal.        Judgment: Judgment normal.      Lab results: No results found for this or any previous visit (from the past 24 hour(s)).  Imaging results:  No results found.   Assessment & Plan: Patient admitted to  sickle cell day infusion center for management of pain crisis.  Patient is opiate tolerant Initiate IV dilaudid PCA. Settings of 0.5 mg, 10 minute lockout, and 10 mg per hour IV fluids, 0.45% saline at 100 ml/hr Toradol 15 mg IV times one dose Tylenol 1000 mg by mouth times one dose Review CBC with differential, complete metabolic panel, and reticulocytes as results become available. Baseline hemoglobin is Pain intensity will be reevaluated in context of functioning and relationship to baseline as care progress If pain intensity remains elevated and/or sudden change in hemodynamic stability transition to inpatient services for  higher level of care.    [name]  APRN, MSN, FNP-C [facility] [address] [place], [place] [address] [phone]  12/07/2019, 5:40 PM

## 2019-12-06 NOTE — Progress Notes (Signed)
Patient admitted to the day hospital for treatment of sickle cell pain crisis. Patient reported generalized pain rated 10/10. Patient placed on Dilaudid PCA, PO Tylenol, IV Toradol and PO Oxycodone and hydrated with IV fluids. At discharge patient reported  pain at 5/10. Discharge instructions given to patient. Patient alert, oriented and ambulatory at discharge.

## 2019-12-06 NOTE — ED Provider Notes (Signed)
[name] DEPT Provider Note   CSN: [id number] Arrival date & time: 12/06/19  [id number]     History Chief Complaint  Patient presents with  . Sickle Cell Pain Crisis  . Back Pain  . Leg Pain    Carmen [name] is a 29 y.o. female.  29 yo F with a chief complaints of sickle cell pain crisis.  Patient states that she has run out of her home narcotics and is waiting until Monday to get them filled.  She was seen by her family doctor a couple days ago and was started on muscle relaxants and NSAIDs.  Patient has had no improvement with.  Pain is mostly in her low back and goes to her legs.  Typical of her sickle cell pain.  No new areas of pain.  No chest pain no cough no fever.  The history is provided by the patient.  Sickle Cell Pain Crisis Location:  Lower extremity and back Severity:  Severe Onset quality:  Gradual Duration:  1 week Similar to previous crisis episodes: yes   Timing:  Constant Progression:  Worsening Chronicity:  Recurrent Context: change in medication   Relieved by:  Nothing Worsened by:  Nothing Ineffective treatments:  None tried Associated symptoms: no chest pain, no congestion, no fever, no headaches, no nausea, no shortness of breath, no vomiting and no wheezing   Back Pain Associated symptoms: leg pain   Associated symptoms: no chest pain, no dysuria, no fever and no headaches   Leg Pain Associated symptoms: back pain   Associated symptoms: no fever        Past Medical History:  Diagnosis Date  . Headache(784.0)   . Sickle cell crisis ([place])   . Syphilis 2015   Was diagnosed and received one injection of antibiotics  . Thrombocytosis ([place]) 11/22/2014     CBC Latest Ref Rng & Units 12/13/2018 12/11/2018 12/10/2018 WBC 4.0 - 10.5 K/uL 14.8(H) 13.2(H) 15.9(H) Hemoglobin 12.0 - 15.0 g/dL 8.2(L) 7.7(L) 8.1(L) Hematocrit 36.0 - 46.0 % 23.7(L) 23.2(L) 24.5(L) Platelets 150 - 400 K/uL 326 399 388      Patient Active Problem List    Diagnosis Date Noted  . Sickle cell pain crisis ([place]) 09/10/2019  . Sickle cell disease ([place]) 12/04/2018  . Alpha thalassemia silent carrier 12/04/2018  . Chronic prescription opiate use 03/13/2018  . Chronic musculoskeletal pain 03/13/2018  . Vitamin D deficiency 01/13/2018  . Leukocytosis 08/21/2017  . 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[place]) 04/05/2017  . Hb-SS disease without crisis ([place]) 08/15/2016  . Anemia of chronic disease   . Systolic ejection murmur [id number]  . Sickle cell anemia of mother during pregnancy [facility]) 05/16/2014    Past Surgical History:  Procedure Laterality Date  . CESAREAN SECTION N/A 02/05/2019   Procedure: CESAREAN SECTION;  Surgeon: [name], MD;  [facility];  Service: Obstetrics;  Laterality: N/A;  . CHOLECYSTECTOMY N/A 11/30/2014   Procedure: LAPAROSCOPIC CHOLECYSTECTOMY SINGLE SITE WITH INTRAOPERATIVE CHOLANGIOGRAM;  Surgeon: Michael Boston, MD;  Location: [facility] ORS;  Service: General;  Laterality: N/A;  . SPLENECTOMY       OB History    Gravida  2   Para  1   Term  1   Preterm      AB  1   Living  1     SAB      TAB  1   Ectopic      Multiple  0  Live Births  1           Family History  Problem Relation Age of Onset  . Hypertension Mother   . Sickle cell anemia Sister   . Kidney disease Sister        Lupus  . Arthritis Sister   . Sickle cell anemia Sister   . Sickle cell trait Sister   . Heart disease Maternal Aunt        CABG  . Heart disease Maternal Uncle        CABG  . Lupus Sister     Social History   Tobacco Use  . Smoking status: Current Some Day Smoker    Types: Cigarettes  . Smokeless tobacco: Never Used  Substance Use Topics  . Alcohol use: No  . Drug use: No    Home Medications Prior to Admission medications   Medication Sig Start Date End Date Taking? Authorizing Provider  acetaminophen (TYLENOL) 500 MG tablet Take 2 tablets (1,000 mg total) by mouth 2 (two) times daily  as needed. 12/04/19   Azzie Glatter, FNP  aspirin EC 81 MG tablet Take 1 tablet (81 mg total) by mouth daily. 03/15/19   Lanae Boast, FNP  cyclobenzaprine (FLEXERIL) 10 MG tablet Take 1 tablet (10 mg total) by mouth 2 (two) times daily. 12/04/19   Azzie Glatter, FNP  folic acid (FOLVITE) 1 MG tablet Take 1 tablet (1 mg total) by mouth daily. 03/15/19   Lanae Boast, FNP  ibuprofen (ADVIL) 800 MG tablet Take 1 tablet (800 mg total) by mouth 3 (three) times daily with meals as needed. 03/15/19   Lanae Boast, FNP  morphine (MS CONTIN) 60 MG 12 hr tablet Take 1 tablet (60 mg total) by mouth every 12 (twelve) hours. 12/07/19 01/06/20  Azzie Glatter, FNP  Oxycodone HCl 10 MG TABS Take 1 tablet (10 mg total) by mouth every 6 (six) hours as needed for up to 15 days (PAIN). 11/29/19 12/14/19  Azzie Glatter, FNP  Prenatal Vit-Fe Fumarate-FA (PREPLUS) 27-1 MG TABS Take 1 tablet by mouth daily. 08/17/18   [name], MD  Vitamin D, Ergocalciferol, (DRISDOL) 1.25 MG (50000 UT) CAPS capsule TAKE ONE CAPSULE BY MOUTH ONE TIME PER WEEK Patient taking differently: Take 50,000 Units by mouth every 7 (seven) days.  10/11/19   Azzie Glatter, FNP    Allergies    Patient has no known allergies.  Review of Systems   Review of Systems  Constitutional: Negative for chills and fever.  HENT: Negative for congestion and rhinorrhea.   Eyes: Negative for redness and visual disturbance.  Respiratory: Negative for shortness of breath and wheezing.   Cardiovascular: Negative for chest pain and palpitations.  Gastrointestinal: Negative for nausea and vomiting.  Genitourinary: Negative for dysuria and urgency.  Musculoskeletal: Positive for back pain. Negative for arthralgias and myalgias.  Skin: Negative for pallor and wound.  Neurological: Negative for dizziness and headaches.    Physical Exam Updated Vital Signs BP 118/71 (BP Location: Right Arm)   Pulse (!) 101   Temp 98.3 F (36.8 C) (Oral)    Resp 16   LMP 11/08/2019   SpO2 99%   Physical Exam Vitals and nursing note reviewed.  Constitutional:      General: She is not in acute distress.    Appearance: She is well-developed. She is not diaphoretic.  HENT:     Head: Normocephalic and atraumatic.  Eyes:     Pupils: Pupils  are equal, round, and reactive to light.  Cardiovascular:     Rate and Rhythm: Normal rate and regular rhythm.     Heart sounds: No murmur. No friction rub. No gallop.   Pulmonary:     Effort: Pulmonary effort is normal.     Breath sounds: No wheezing or rales.  Abdominal:     General: There is no distension.     Palpations: Abdomen is soft.     Tenderness: There is no abdominal tenderness.  Musculoskeletal:        General: No tenderness.     Cervical back: Normal range of motion and neck supple.  Skin:    General: Skin is warm and dry.  Neurological:     Mental Status: She is alert and oriented to person, place, and time.  Psychiatric:        Behavior: Behavior normal.     ED Results / Procedures / Treatments   Labs (all labs ordered are listed, but only abnormal results are displayed) Labs Reviewed - No data to display  EKG None  Radiology No results found.  Procedures Procedures (including critical care time)  Medications Ordered in ED Medications - No data to display  ED Course  I have reviewed the triage vital signs and the nursing notes.  Pertinent labs & imaging results that were available during my care of the patient were reviewed by me and considered in my medical decision making (see chart for details).    MDM Rules/Calculators/A&P                      29 yo F with a chief complaint of sickle cell pain crisis.  Going on for about a week.  No new areas of pain no fever no cough no shortness of breath.  99% on room air.  No abdominal tenderness on my exam.  I discussed case with the provider at the sickle cell clinic who has space for her and I will send her over for  evaluation.  11:28 AM:  I have discussed the diagnosis/risks/treatment options with the patient and believe the pt to be eligible for discharge home to follow-up with PCP. We also discussed returning to the ED immediately if new or worsening sx occur. We discussed the sx which are most concerning (e.g., sudden worsening pain, fever, inability to tolerate by mouth) that necessitate immediate return. Medications administered to the patient during their visit and any new prescriptions provided to the patient are listed below.  Medications given during this visit Medications - No data to display   The patient appears reasonably screen and/or stabilized for discharge and I doubt any other medical condition or other [facility] requiring further screening, evaluation, or treatment in the ED at this time prior to discharge.   Final Clinical Impression(s) / ED Diagnoses Final diagnoses:  Sickle cell pain crisis [facility])    Rx / DC Orders ED Discharge Orders    None       Deno Etienne, DO 12/06/19 [id number]

## 2019-12-06 NOTE — ED Triage Notes (Signed)
Pt c/o lower back and bilat leg sickle cell pains since last week. Had virtual appt couple days ago where added new meds to current pain medications that arent helping.

## 2019-12-06 NOTE — Telephone Encounter (Signed)
Pt called from ED. Pt says tylenol and cyclobenzaprine does not work. Wants morphine. Please call patient back. Asked to be contacted back at [phone]

## 2019-12-06 NOTE — Telephone Encounter (Signed)
Please review morphine request. Patient is aware you are not in the office today.

## 2019-12-07 NOTE — Discharge Summary (Signed)
[facility] Discharge Summary   Patient ID: Carmen [name] MRN: [id number] DOB/AGE: [birth year] 29 y.o.  Admit date: 12/06/2019 Discharge date: 12/07/2019  Primary Care Physician:  Lanae Boast, [place]  Admission Diagnoses:  Active Problems:   Sickle cell pain crisis [facility])    Discharge Medications:  Allergies as of 12/06/2019   No Known Allergies     Medication List    TAKE these medications   acetaminophen 500 MG tablet Commonly known as: TYLENOL Take 2 tablets (1,000 mg total) by mouth 2 (two) times daily as needed.   aspirin EC 81 MG tablet Take 1 tablet (81 mg total) by mouth daily.   cyclobenzaprine 10 MG tablet Commonly known as: FLEXERIL Take 1 tablet (10 mg total) by mouth 2 (two) times daily.   folic acid 1 MG tablet Commonly known as: FOLVITE Take 1 tablet (1 mg total) by mouth daily.   ibuprofen 800 MG tablet Commonly known as: ADVIL Take 1 tablet (800 mg total) by mouth 3 (three) times daily with meals as needed.   morphine 60 MG 12 hr tablet Commonly known as: MS Contin Take 1 tablet (60 mg total) by mouth every 12 (twelve) hours.   Oxycodone HCl 10 MG Tabs Take 1 tablet (10 mg total) by mouth every 6 (six) hours as needed for up to 15 days (PAIN).   PrePLUS 27-1 MG Tabs Take 1 tablet by mouth daily.   Vitamin D (Ergocalciferol) 1.25 MG (50000 UNIT) Caps capsule Commonly known as: DRISDOL TAKE ONE CAPSULE BY MOUTH ONE TIME PER WEEK What changed: See the new instructions.        Consults:  None  Significant Diagnostic Studies:  No results found. History of present illness: Carmen [name], a29 year old female with a medical history significant for sickle cell disease, chronic pain syndrome, opiate dependence and tolerance, and anemia of chronic disease presents complaining of generalized pain that is consistent with her pain crisis.  Pain is primarily to low back, upper and lower extremities.  Pain has been elevated over the past  several days and patient attributes crisis to changes in weather.  Patient was treated and evaluated in the emergency room 1 day prior and transition from the emergency department this a.m.  Agreed with ER provider that patient was appropriate for pain management and extended observation.  Current pain intensity is 9/10 characterized as constant and aching, occasionally sharp.  Patient denies fever, chills, chest pain, shortness of breath, urinary symptoms, nausea, vomiting, or diarrhea.  No sick contacts, recent travel, or exposure to COVID-19.  [facility] Course: Reviewed all laboratory values, consistent with patient's baseline. Initiated IV Dilaudid PCA.  Settings of 0.5 mg, 10-minute lockout, and 3 mg/h. Toradol 15 mg IV x1 dose Tylenol 1000 mg by mouth x1 dose IV fluids, 0.45% saline at 100 mL/h. Pain intensity decreased to 5/10.  Patient does not warrant admission on today.  She is alert, oriented, and ambulating without assistance. She will discharge home in a hemodynamically stable condition.   Discharge instructions: Resume all home medications.   Follow up with PCP as previously  scheduled.   Discussed the importance of drinking 64 ounces of water daily, dehydration of red blood cells may lead further sickling.   Avoid all stressors that precipitate sickle cell pain crisis.     The patient was given clear instructions to go to ER or return to medical center if symptoms do not improve, worsen or new problems develop.  Physical Exam at Discharge:  BP (!) 116/54 (BP Location: Left Arm)   Pulse 65   Temp 98.5 F (36.9 C) (Oral)   Resp 14   Ht 5\' 3"  (1.6 m)   Wt 165 lb (74.8 kg)   LMP 11/08/2019   SpO2 99%   BMI 29.23 kg/m  Physical Exam Constitutional:      Appearance: Normal appearance.  HENT:     Head: Normocephalic.     Nose: Nose normal.     Mouth/Throat:     Mouth: Mucous membranes are moist.  Eyes:     Pupils: Pupils are equal, round, and  reactive to light.  Cardiovascular:     Rate and Rhythm: Normal rate and regular rhythm.     Pulses: Normal pulses.  Pulmonary:     Effort: Pulmonary effort is normal.     Breath sounds: Normal breath sounds.  Abdominal:     General: Abdomen is flat. Bowel sounds are normal.  Skin:    General: Skin is warm.  Neurological:     General: No focal deficit present.     Mental Status: She is alert. Mental status is at baseline.  Psychiatric:        Mood and Affect: Mood normal.        Behavior: Behavior normal.        Thought Content: Thought content normal.        Judgment: Judgment normal.      Disposition at Discharge: Discharge disposition: 01-Home or Self Care       Discharge Orders: Discharge Instructions    Discharge patient   Complete by: As directed    Discharge disposition: 01-Home or Self Care   Discharge patient date: 12/06/2019      Condition at Discharge:   Stable  Time spent on Discharge:  Greater than 30 minutes.  Signed: [name]  APRN, MSN, FNP-C [facility] [address] [place], [place] [address] [phone]  12/07/2019, 5:34 PM

## 2019-12-18 ENCOUNTER — Other Ambulatory Visit: Payer: Self-pay | Admitting: Family Medicine

## 2019-12-18 ENCOUNTER — Telehealth: Payer: Self-pay | Admitting: Family Medicine

## 2019-12-18 DIAGNOSIS — Z79891 Long term (current) use of opiate analgesic: Secondary | ICD-10-CM

## 2019-12-18 DIAGNOSIS — D571 Sickle-cell disease without crisis: Secondary | ICD-10-CM

## 2019-12-18 MED ORDER — OXYCODONE HCL 10 MG PO TABS: 10.0000 mg | ORAL_TABLET | Freq: Four times a day (QID) | ORAL | 0 refills | Status: DC | PRN

## 2019-12-19 NOTE — Telephone Encounter (Signed)
Done

## 2019-12-31 ENCOUNTER — Other Ambulatory Visit: Payer: Self-pay | Admitting: Nurse Practitioner

## 2019-12-31 ENCOUNTER — Other Ambulatory Visit: Payer: Self-pay | Admitting: Family Medicine

## 2019-12-31 ENCOUNTER — Other Ambulatory Visit: Payer: Self-pay

## 2019-12-31 ENCOUNTER — Emergency Department (HOSPITAL_COMMUNITY)
Admission: EM | Admit: 2019-12-31 | Discharge: 2019-12-31 | Disposition: A | Discharge status: Home / Self Care | Payer: Medicaid Other | Source: Home / Self Care | Attending: Emergency Medicine | Admitting: Emergency Medicine

## 2019-12-31 ENCOUNTER — Ambulatory Visit (INDEPENDENT_AMBULATORY_CARE_PROVIDER_SITE_OTHER): Payer: Medicaid Other

## 2019-12-31 ENCOUNTER — Non-Acute Institutional Stay (HOSPITAL_BASED_OUTPATIENT_CLINIC_OR_DEPARTMENT_OTHER)
Admission: AD | Admit: 2019-12-31 | Discharge: 2019-12-31 | Disposition: A | Discharge status: Home / Self Care | Payer: Medicaid Other | Source: Ambulatory Visit | Attending: Internal Medicine | Admitting: Internal Medicine

## 2019-12-31 DIAGNOSIS — D571 Sickle-cell disease without crisis: Secondary | ICD-10-CM | POA: Diagnosis not present

## 2019-12-31 DIAGNOSIS — D5700 Hb-SS disease with crisis, unspecified: Secondary | ICD-10-CM

## 2019-12-31 DIAGNOSIS — F17210 Nicotine dependence, cigarettes, uncomplicated: Secondary | ICD-10-CM | POA: Insufficient documentation

## 2019-12-31 DIAGNOSIS — Z79891 Long term (current) use of opiate analgesic: Secondary | ICD-10-CM | POA: Insufficient documentation

## 2019-12-31 DIAGNOSIS — R300 Dysuria: Secondary | ICD-10-CM

## 2019-12-31 DIAGNOSIS — Z7982 Long term (current) use of aspirin: Secondary | ICD-10-CM | POA: Insufficient documentation

## 2019-12-31 DIAGNOSIS — R82998 Other abnormal findings in urine: Secondary | ICD-10-CM

## 2019-12-31 DIAGNOSIS — Z79899 Other long term (current) drug therapy: Secondary | ICD-10-CM | POA: Insufficient documentation

## 2019-12-31 DIAGNOSIS — N760 Acute vaginitis: Secondary | ICD-10-CM

## 2019-12-31 DIAGNOSIS — Z23 Encounter for immunization: Secondary | ICD-10-CM | POA: Diagnosis not present

## 2019-12-31 LAB — RETICULOCYTES
Immature Retic Fract: 40.6 % — ABNORMAL HIGH (ref 2.3–15.9)
RBC.: 3.6 MIL/uL — ABNORMAL LOW (ref 3.87–5.11)
Retic Count, Absolute: 242.3 10*3/uL — ABNORMAL HIGH (ref 19.0–186.0)
Retic Ct Pct: 6.7 % — ABNORMAL HIGH (ref 0.4–3.1)

## 2019-12-31 LAB — BASIC METABOLIC PANEL
Anion gap: 7 (ref 5–15)
BUN: 8 mg/dL (ref 6–20)
CO2: 25 mmol/L (ref 22–32)
Calcium: 9.3 mg/dL (ref 8.9–10.3)
Chloride: 107 mmol/L (ref 98–111)
Creatinine, Ser: 0.49 mg/dL (ref 0.44–1.00)
GFR calc Af Amer: >60 mL/min (ref >60)
GFR calc non Af Amer: >60 mL/min (ref >60)
Glucose, Bld: 98 mg/dL (ref 70–99)
Potassium: 4.2 mmol/L (ref 3.5–5.1)
Sodium: 139 mmol/L (ref 135–145)

## 2019-12-31 LAB — URINALYSIS, COMPLETE (UACMP) WITH MICROSCOPIC
Bilirubin Urine: NEGATIVE
Glucose, UA: NEGATIVE mg/dL
Hgb urine dipstick: NEGATIVE
Ketones, ur: NEGATIVE mg/dL
Nitrite: NEGATIVE
Protein, ur: NEGATIVE mg/dL
Specific Gravity, Urine: 1.011 (ref 1.005–1.030)
pH: 6 (ref 5.0–8.0)

## 2019-12-31 LAB — CBC WITH DIFFERENTIAL/PLATELET
Abs Immature Granulocytes: 0.03 10*3/uL (ref 0.00–0.07)
Basophils Absolute: 0.1 10*3/uL (ref 0.0–0.1)
Basophils Relative: 1 %
Eosinophils Absolute: 0.3 10*3/uL (ref 0.0–0.5)
Eosinophils Relative: 4 %
HCT: 30 % — ABNORMAL LOW (ref 36.0–46.0)
Hemoglobin: 10.5 g/dL — ABNORMAL LOW (ref 12.0–15.0)
Immature Granulocytes: 0 %
Lymphocytes Relative: 41 %
Lymphs Abs: 3.5 10*3/uL (ref 0.7–4.0)
MCH: 29.4 pg (ref 26.0–34.0)
MCHC: 35 g/dL (ref 30.0–36.0)
MCV: 84 fL (ref 80.0–100.0)
Monocytes Absolute: 0.8 10*3/uL (ref 0.1–1.0)
Monocytes Relative: 10 %
Neutro Abs: 3.7 10*3/uL (ref 1.7–7.7)
Neutrophils Relative %: 44 %
Platelets: 563 10*3/uL — ABNORMAL HIGH (ref 150–400)
RBC: 3.57 MIL/uL — ABNORMAL LOW (ref 3.87–5.11)
RDW: 14.2 % (ref 11.5–15.5)
WBC: 8.5 10*3/uL (ref 4.0–10.5)
nRBC: 1.2 % — ABNORMAL HIGH (ref 0.0–0.2)

## 2019-12-31 LAB — I-STAT BETA HCG BLOOD, ED (MC, WL, AP ONLY): I-stat hCG, quantitative: <5 m[IU]/mL (ref <5)

## 2019-12-31 MED ORDER — HYDROMORPHONE 1 MG/ML IV SOLN
INTRAVENOUS | Status: DC
  Administered 2019-12-31: 30 mg via INTRAVENOUS
  Administered 2019-12-31: 10.5 mg via INTRAVENOUS
  Filled 2019-12-31: qty 30

## 2019-12-31 MED ORDER — DIPHENHYDRAMINE HCL 25 MG PO CAPS: 25.0000 mg | ORAL_CAPSULE | ORAL | Status: DC | PRN

## 2019-12-31 MED ORDER — OXYCODONE HCL 5 MG PO TABS
10.0000 mg | ORAL_TABLET | Freq: Once | ORAL | Status: AC
  Administered 2019-12-31: 16:00:00 10 mg via ORAL
  Filled 2019-12-31: qty 2

## 2019-12-31 MED ORDER — ONDANSETRON HCL 4 MG/2ML IJ SOLN
4.0000 mg | INTRAMUSCULAR | Status: DC | PRN
  Administered 2019-12-31: 4 mg via INTRAVENOUS
  Filled 2019-12-31: qty 2

## 2019-12-31 MED ORDER — SODIUM CHLORIDE 0.9% FLUSH: 9.0000 mL | INTRAVENOUS | Status: DC | PRN

## 2019-12-31 MED ORDER — HYDROMORPHONE HCL 2 MG/ML IJ SOLN
2.0000 mg | INTRAMUSCULAR | Status: AC
  Administered 2019-12-31: 2 mg via SUBCUTANEOUS
  Filled 2019-12-31: qty 1

## 2019-12-31 MED ORDER — ACETAMINOPHEN 500 MG PO TABS
1000.0000 mg | ORAL_TABLET | Freq: Once | ORAL | Status: AC
  Administered 2019-12-31: 1000 mg via ORAL
  Filled 2019-12-31: qty 2

## 2019-12-31 MED ORDER — NALOXONE HCL 0.4 MG/ML IJ SOLN: 0.4000 mg | INTRAMUSCULAR | Status: DC | PRN

## 2019-12-31 MED ORDER — DIPHENHYDRAMINE HCL 50 MG/ML IJ SOLN
25.0000 mg | Freq: Once | INTRAMUSCULAR | Status: AC
  Administered 2019-12-31: 25 mg via INTRAVENOUS
  Filled 2019-12-31: qty 1

## 2019-12-31 MED ORDER — DEXTROSE-NACL 5-0.45 % IV SOLN: INTRAVENOUS | Status: DC

## 2019-12-31 MED ORDER — ONDANSETRON HCL 4 MG/2ML IJ SOLN
4.0000 mg | Freq: Four times a day (QID) | INTRAMUSCULAR | Status: DC | PRN
  Administered 2019-12-31: 4 mg via INTRAVENOUS
  Filled 2019-12-31: qty 2

## 2019-12-31 MED ORDER — SODIUM CHLORIDE 0.9 % IV SOLN
25.0000 mg | INTRAVENOUS | Status: DC | PRN
  Filled 2019-12-31: qty 0.5

## 2019-12-31 MED ORDER — NITROFURANTOIN MONOHYD MACRO 100 MG PO CAPS: 100.0000 mg | ORAL_CAPSULE | Freq: Two times a day (BID) | ORAL | 0 refills | Status: AC

## 2019-12-31 MED ORDER — SODIUM CHLORIDE 0.45 % IV SOLN: INTRAVENOUS | Status: DC

## 2019-12-31 NOTE — ED Notes (Signed)
Patient to be walked over to sickle cell clinic after d/c.

## 2019-12-31 NOTE — Progress Notes (Signed)
Patient admitted to the day infusion hospital from [facility] for sickle cell pain crisis. Initially, patient reported bilateral arm and back pain rated 9/10. For pain management, patient placed on Dilaudid PCA, given 1000 mg Tylenol, 10 mg Oxycodone and hydrated with IV fluids. At discharge, patient rated pain at 5/10. Vital signs stable. Discharge instructions given. Patient alert, oriented and ambulatory at discharge.

## 2019-12-31 NOTE — ED Triage Notes (Signed)
29 yo female c/o pain in back radiating to shoulders and arms for the past 2 days. Hx of Sickle Cell.

## 2019-12-31 NOTE — Telephone Encounter (Signed)
Pt was advised in day hospital today that medication will not be filled early and is due on 3.3.2021. Thanks !~

## 2019-12-31 NOTE — H&P (Signed)
[facility] History and Physical   Date: 12/31/2019  Patient name: Carmen [name] Medical record number: [id number] Date of birth: [birth year] Age: 29 y.o. Gender: female PCP: Vevelyn Francois, NP  Attending physician: Tresa Garter, MD  Chief Complaint: Sickle cell pain  History of Present Illness: Carmen [name], a 29 year old female with a medical history significant for sickle cell disease, chronic pain syndrome, opiate dependence and tolerance, and history of anemia of chronic disease presents complaining of generalized pain that is consistent with her typical crisis.  Patient has been taking home medications around-the-clock without sustained relief.  She last had MS Contin and oxycodone this a.m.  Patient was treated and evaluated in the emergency department with some relief.  Agree with ER provider that patient is appropriate to transition to sickle cell day infusion center for pain management and extended observation.  Pain intensity 8/10 characterized as constant and throbbing.  Patient states that she has been unable to get comfortable.  No sick contacts, recent travel, or exposure to COVID-19.  Patient denies headache, chest pain, fever, chills, shortness of breath, nausea, vomiting, or diarrhea.  Patient endorses urinary frequency and dysuria over the past several days.   Meds: Medications Prior to Admission  Medication Sig Dispense Refill Last Dose  . acetaminophen (TYLENOL) 500 MG tablet Take 2 tablets (1,000 mg total) by mouth 2 (two) times daily as needed. 30 tablet 0 12/30/2019 at Unknown time  . aspirin EC 81 MG tablet Take 1 tablet (81 mg total) by mouth daily. 90 tablet 3 12/30/2019 at Unknown time  . cyclobenzaprine (FLEXERIL) 10 MG tablet Take 1 tablet (10 mg total) by mouth 2 (two) times daily. 60 tablet 3 12/30/2019 at Unknown time  . folic acid (FOLVITE) 1 MG tablet Take 1 tablet (1 mg total) by mouth daily. 90 tablet 3 12/30/2019 at Unknown time  .  ibuprofen (ADVIL) 800 MG tablet Take 1 tablet (800 mg total) by mouth 3 (three) times daily with meals as needed. 30 tablet 1 12/30/2019 at Unknown time  . morphine (MS CONTIN) 60 MG 12 hr tablet Take 1 tablet (60 mg total) by mouth every 12 (twelve) hours. 60 tablet 0 12/30/2019 at Unknown time  . Oxycodone HCl 10 MG TABS Take 1 tablet (10 mg total) by mouth every 6 (six) hours as needed for up to 15 days (PAIN). 60 tablet 0 12/30/2019 at Unknown time  . Vitamin D, Ergocalciferol, (DRISDOL) 1.25 MG (50000 UT) CAPS capsule TAKE ONE CAPSULE BY MOUTH ONE TIME PER WEEK (Patient taking differently: Take 50,000 Units by mouth every 7 (seven) days. ) 30 capsule 1 12/30/2019 at Unknown time  . Prenatal Vit-Fe Fumarate-FA (PREPLUS) 27-1 MG TABS Take 1 tablet by mouth daily. 30 tablet 13     Allergies: Patient has no known allergies. Past Medical History:  Diagnosis Date  . Headache(784.0)   . Sickle cell crisis ([place])   . Syphilis 2015   Was diagnosed and received one injection of antibiotics  . Thrombocytosis ([place]) 11/22/2014     CBC Latest Ref Rng & Units 12/13/2018 12/11/2018 12/10/2018 WBC 4.0 - 10.5 K/uL 14.8(H) 13.2(H) 15.9(H) Hemoglobin 12.0 - 15.0 g/dL 8.2(L) 7.7(L) 8.1(L) Hematocrit 36.0 - 46.0 % 23.7(L) 23.2(L) 24.5(L) Platelets 150 - 400 K/uL 326 399 388     Past Surgical History:  Procedure Laterality Date  . CESAREAN SECTION N/A 02/05/2019   Procedure: CESAREAN SECTION;  Surgeon: [name], MD;  Location: [facility]  LD ORS;  Service: Obstetrics;  Laterality: N/A;  . CHOLECYSTECTOMY N/A 11/30/2014   Procedure: LAPAROSCOPIC CHOLECYSTECTOMY SINGLE SITE WITH INTRAOPERATIVE CHOLANGIOGRAM;  Surgeon: Michael Boston, MD;  Location: [facility] ORS;  Service: General;  Laterality: N/A;  . SPLENECTOMY     Family History  Problem Relation Age of Onset  . Hypertension Mother   . Sickle cell anemia Sister   . Kidney disease Sister        Lupus  . Arthritis Sister   . Sickle cell anemia Sister   . Sickle cell trait  Sister   . Heart disease Maternal Aunt        CABG  . Heart disease Maternal Uncle        CABG  . Lupus Sister    Social History   Socioeconomic History  . Marital status: Single    Spouse name: Not on file  . Number of children: Not on file  . Years of education: Not on file  . Highest education level: Not on file  Occupational History  . Not on file  Tobacco Use  . Smoking status: Current Some Day Smoker    Types: Cigarettes  . Smokeless tobacco: Never Used  Substance and Sexual Activity  . Alcohol use: No  . Drug use: No  . Sexual activity: Yes    Birth control/protection: Injection  Other Topics Concern  . Not on file  Social History Narrative  . Not on file   Social Determinants of Health   Financial Resource Strain:   . Difficulty of Paying Living Expenses: Not on file  Food Insecurity:   . Worried About Charity fundraiser in the Last Year: Not on file  . Ran Out of Food in the Last Year: Not on file  Transportation Needs:   . Lack of Transportation (Medical): Not on file  . Lack of Transportation (Non-Medical): Not on file  Physical Activity:   . Days of Exercise per Week: Not on file  . Minutes of Exercise per Session: Not on file  Stress:   . Feeling of Stress : Not on file  Social Connections:   . Frequency of Communication with Friends and Family: Not on file  . Frequency of Social Gatherings with Friends and Family: Not on file  . Attends Religious Services: Not on file  . Active Member of Clubs or Organizations: Not on file  . Attends Archivist Meetings: Not on file  . Marital Status: Not on file  Intimate Partner Violence:   . Fear of Current or Ex-Partner: Not on file  . Emotionally Abused: Not on file  . Physically Abused: Not on file  . Sexually Abused: Not on file   Review of Systems  Constitutional: Negative for chills and fever.  HENT: Negative.   Eyes: Negative.   Respiratory: Negative.   Cardiovascular: Negative.    Gastrointestinal: Negative.   Genitourinary: Positive for dysuria and frequency.  Musculoskeletal: Positive for back pain and joint pain.  Skin: Negative.   Neurological: Negative.   Psychiatric/Behavioral: Negative.    .ros Physical Exam: Blood pressure 113/64, pulse 89, temperature 98.6 F (37 C), temperature source Oral, resp. rate 18, last menstrual period 12/12/2019, SpO2 100 %, not currently breastfeeding.  Physical Exam Constitutional:      Appearance: Normal appearance.  HENT:     Mouth/Throat:     Mouth: Mucous membranes are moist.  Eyes:     Pupils: Pupils are equal, round, and reactive to light.  Cardiovascular:  Rate and Rhythm: Normal rate and regular rhythm.  Pulmonary:     Effort: Pulmonary effort is normal.  Abdominal:     General: Abdomen is flat. Bowel sounds are normal.  Musculoskeletal:        General: Normal range of motion.  Skin:    General: Skin is warm.  Neurological:     General: No focal deficit present.     Mental Status: She is alert. Mental status is at baseline.  Psychiatric:        Mood and Affect: Mood normal.        Thought Content: Thought content normal.        Judgment: Judgment normal.     Lab results: Results for orders placed or performed during the hospital encounter of 12/31/19 (from the past 24 hour(s))  Reticulocytes     Status: Abnormal   Collection Time: 12/31/19  9:00 AM  Result Value Ref Range   Retic Ct Pct 6.7 (H) 0.4 - 3.1 %   RBC. 3.60 (L) 3.87 - 5.11 MIL/uL   Retic Count, Absolute 242.3 (H) 19.0 - 186.0 K/uL   Immature Retic Fract 40.6 (H) 2.3 - 0000000 %  Basic metabolic panel     Status: None   Collection Time: 12/31/19  9:00 AM  Result Value Ref Range   Sodium 139 135 - 145 mmol/L   Potassium 4.2 3.5 - 5.1 mmol/L   Chloride 107 98 - 111 mmol/L   CO2 25 22 - 32 mmol/L   Glucose, Bld 98 70 - 99 mg/dL   BUN 8 6 - 20 mg/dL   Creatinine, Ser 0.49 0.44 - 1.00 mg/dL   Calcium 9.3 8.9 - 10.3 mg/dL   GFR calc  non Af Amer >60 >60 mL/min   GFR calc Af Amer >60 >60 mL/min   Anion gap 7 5 - 15  CBC WITH DIFFERENTIAL     Status: Abnormal   Collection Time: 12/31/19  9:00 AM  Result Value Ref Range   WBC 8.5 4.0 - 10.5 K/uL   RBC 3.57 (L) 3.87 - 5.11 MIL/uL   Hemoglobin 10.5 (L) 12.0 - 15.0 g/dL   HCT 30.0 (L) 36.0 - 46.0 %   MCV 84.0 80.0 - 100.0 fL   MCH 29.4 26.0 - 34.0 pg   MCHC 35.0 30.0 - 36.0 g/dL   RDW 14.2 11.5 - 15.5 %   Platelets 563 (H) 150 - 400 K/uL   nRBC 1.2 (H) 0.0 - 0.2 %   Neutrophils Relative % 44 %   Neutro Abs 3.7 1.7 - 7.7 K/uL   Lymphocytes Relative 41 %   Lymphs Abs 3.5 0.7 - 4.0 K/uL   Monocytes Relative 10 %   Monocytes Absolute 0.8 0.1 - 1.0 K/uL   Eosinophils Relative 4 %   Eosinophils Absolute 0.3 0.0 - 0.5 K/uL   Basophils Relative 1 %   Basophils Absolute 0.1 0.0 - 0.1 K/uL   Immature Granulocytes 0 %   Abs Immature Granulocytes 0.03 0.00 - 0.07 K/uL  I-Stat beta hCG blood, ED     Status: None   Collection Time: 12/31/19  9:15 AM  Result Value Ref Range   I-stat hCG, quantitative <5.0 <5 mIU/mL   Comment 3            Imaging results:  No results found.   Assessment & Plan: Patient admitted to sickle cell day infusion center for management of pain crisis.  Patient is opiate tolerant Initiate IV dilaudid  PCA. Settings of 0.5 mg, 10 minute lockout, and 3 mg/hr IV fluids, 0.45% saline at 100 mL/h Tylenol 1000 mg by mouth times one dose Reviewed labs from ER, do not warrant repeating at this time Patient complaining of urinary frequency and dysuria, review urinalysis and urine culture as results become available Pain intensity will be reevaluated in context of functioning and relationship to baseline as care progress If pain intensity remains elevated and/or sudden change in hemodynamic stability transition to inpatient services for higher level of care.   [name]  APRN, MSN, FNP-C [facility] [address] [place], [place] [address] [phone]  12/31/2019, 12:46 PM

## 2019-12-31 NOTE — Discharge Instructions (Addendum)
We are discharging you to send you over to the sickle cell clinic for further management of your symptoms.

## 2019-12-31 NOTE — ED Notes (Signed)
Patient being transported by [place], NT to the sickle cell clinic at this time. Per request of sickle cell MD... peripheral IV to be left in so it can be used at the clinic. Plan of care discussed with patient and patient verbalized understanding.

## 2019-12-31 NOTE — Discharge Instructions (Signed)
Nitrofurantoin tablets or capsules What is this medicine? NITROFURANTOIN (nye troe fyoor AN toyn) is an antibiotic. It is used to treat urinary tract infections. This medicine may be used for other purposes; ask your health care provider or pharmacist if you have questions. COMMON BRAND NAME(S): Macrobid, Macrodantin, Urotoin What should I tell my health care provider before I take this medicine? They need to know if you have any of these conditions:  anemia  diabetes  glucose-6-phosphate dehydrogenase deficiency  kidney disease  liver disease  lung disease  other chronic illness  an unusual or allergic reaction to nitrofurantoin, other antibiotics, other medicines, foods, dyes or preservatives  pregnant or trying to get pregnant  breast-feeding How should I use this medicine? Take this medicine by mouth with a glass of water. Follow the directions on the prescription label. Take this medicine with food or milk. Take your doses at regular intervals. Do not take your medicine more often than directed. Do not stop taking except on your doctor's advice. Talk to your pediatrician regarding the use of this medicine in children. While this drug may be prescribed for selected conditions, precautions do apply. Overdosage: If you think you have taken too much of this medicine contact a poison control center or emergency room at once. NOTE: This medicine is only for you. Do not share this medicine with others. What if I miss a dose? If you miss a dose, take it as soon as you can. If it is almost time for your next dose, take only that dose. Do not take double or extra doses. What may interact with this medicine?  antacids containing magnesium trisilicate  probenecid  quinolone antibiotics like ciprofloxacin, lomefloxacin, norfloxacin and ofloxacin  sulfinpyrazone This list may not describe all possible interactions. Give your health care provider a list of all the medicines, herbs,  non-prescription drugs, or dietary supplements you use. Also tell them if you smoke, drink alcohol, or use illegal drugs. Some items may interact with your medicine. What should I watch for while using this medicine? Tell your doctor or health care professional if your symptoms do not improve or if you get new symptoms. Drink several glasses of water a day. If you are taking this medicine for a long time, visit your doctor for regular checks on your progress. If you are diabetic, you may get a false positive result for sugar in your urine with certain brands of urine tests. Check with your doctor. What side effects may I notice from receiving this medicine? Side effects that you should report to your doctor or health care professional as soon as possible:  allergic reactions like skin rash or hives, swelling of the face, lips, or tongue  chest pain  cough  difficulty breathing  dizziness, drowsiness  fever or infection  joint aches or pains  pale or blue-tinted skin  redness, blistering, peeling or loosening of the skin, including inside the mouth  tingling, burning, pain, or numbness in hands or feet  unusual bleeding or bruising  unusually weak or tired  yellowing of eyes or skin Side effects that usually do not require medical attention (report to your doctor or health care professional if they continue or are bothersome):  dark urine  diarrhea  headache  loss of appetite  nausea or vomiting  temporary hair loss This list may not describe all possible side effects. Call your doctor for medical advice about side effects. You may report side effects to FDA at 1-800-FDA-1088. Where should  I keep my medicine? Keep out of the reach of children. Store at room temperature between 15 and 30 degrees C (59 and 86 degrees F). Protect from light. Throw away any unused medicine after the expiration date. NOTE: This sheet is a summary. It may not cover all possible information.  If you have questions about this medicine, talk to your doctor, pharmacist, or health care provider.  2020 Elsevier/Gold Standard (2008-05-08 15:56:47)  Sickle Cell Anemia, Adult  Sickle cell anemia is a condition in which red blood cells have an abnormal "sickle" shape. Red blood cells carry oxygen through the body. Sickle-shaped red blood cells do not live as long as normal red blood cells. They also clump together and block blood from flowing through the blood vessels. This condition prevents the body from getting enough oxygen. Sickle cell anemia causes organ damage and pain. It also increases the risk of infection. What are the causes? This condition is caused by a gene that is passed from parent to child (inherited). Receiving two copies of the gene causes the disease. Receiving one copy causes the "trait," which means that symptoms are milder or not present. What increases the risk? This condition is more likely to develop if your ancestors were from [place], the [place], [place] or [place], the [place], [place], or the [place]. What are the signs or symptoms? Symptoms of this condition include:  Episodes of pain (crises), especially in the hands and feet, joints, back, chest, or abdomen. The pain can be triggered by: ? An illness, especially if there is dehydration. ? Doing an activity with great effort (overexertion). ? Exposure to extreme temperature changes. ? High altitude.  Fatigue.  Shortness of breath or difficulty breathing.  Dizziness.  Pale skin or yellowed skin (jaundice).  Frequent bacterial infections.  Pain and swelling in the hands and feet (hand-food syndrome).  Prolonged, painful erection of the penis (priapism).  Acute chest syndrome. Symptoms of this include: ? Chest pain. ? Fever. ? Cough. ? Fast breathing.  Stroke.  Decreased activity.  Loss of appetite.  Change in behavior.  Headaches.  Seizures.  Vision changes.  Skin  ulcers.  Heart disease.  High blood pressure.  Gallstones.  Liver and kidney problems. How is this diagnosed? This condition is diagnosed with blood tests that check for the gene that causes this condition. How is this treated? There is no cure for most cases of this condition. Treatment focuses on managing your symptoms and preventing complications of the disease. Your health care provider will work with you to identify the best treatment options for you based on an assessment of your condition. Treatment may include:  Medicines, including: ? Pain medicines. ? Antibiotic medicines for infection. ? Medicines to increase the production of a protein in red blood cells that helps carry oxygen in the body (hemoglobin).  Fluids to treat pain and swelling.  Oxygen to treat acute chest syndrome.  Blood transfusions to treat symptoms such as fatigue, stroke, and acute chest syndrome.  Massage and physical therapy for pain.  Regular tests to monitor your condition, such as blood tests, X-rays, CT scans, MRI scans, ultrasounds, and lung function tests. These should be done every 3-12 months, depending on your age.  Hematopoietic stem cell transplant. This is a procedure to replace abnormal stem cells with healthy stem cells from a donor's bone marrow. Stem cells are cells that can develop into blood cells, and bone marrow is the spongy tissue inside the bones. Follow these instructions  at home: Medicines  Take over-the-counter and prescription medicines only as told by your health care provider.  If you were prescribed an antibiotic medicine, take it as told by your health care provider. Do not stop taking the antibiotic even if you start to feel better.  If you develop a fever, do not take medicines to reduce the fever right away. This could cover up another problem. Notify your health care provider. Managing pain, stiffness, and swelling  Try these methods to help ease your  pain: ? Using a heating pad. ? Taking a warm bath. ? Distracting yourself, such as by watching TV. Eating and drinking  Drink enough fluid to keep your urine clear or pale yellow. Drink more in hot weather and during exercise.  Limit or avoid drinking alcohol.  Eat a balanced and nutritious diet. Eat plenty of fruits, vegetables, whole grains, and lean protein.  Take vitamins and supplements as directed by your health care provider. Traveling  When traveling, keep these with you: ? Your medical information. ? The names of your health care providers. ? Your medicines.  If you have to travel by air, ask about precautions you should take. Activity  Get plenty of rest.  Avoid activities that will lower your oxygen levels, such as exercising vigorously. General instructions  Do not use any products that contain nicotine or tobacco, such as cigarettes and e-cigarettes. They lower blood oxygen levels. If you need help quitting, ask your health care provider.  Consider wearing a medical alert bracelet.  Avoid high altitudes.  Avoid extreme temperatures and extreme temperature changes.  Keep all follow-up visits as told by your health care provider. This is important. Contact a health care provider if:  You develop joint pain.  Your feet or hands swell or have pain.  You have fatigue. Get help right away if:  You have symptoms of infection. These include: ? Fever. ? Chills. ? Extreme tiredness. ? Irritability. ? Poor eating. ? Vomiting.  You feel dizzy or faint.  You have new abdominal pain, especially on the left side near the stomach area.  You develop priapism.  You have numbness in your arms or legs or have trouble moving them.  You have trouble talking.  You develop pain that cannot be controlled with medicine.  You become short of breath.  You have rapid breathing.  You have a persistent cough.  You have pain in your chest.  You develop a severe  headache or stiff neck.  You feel bloated without eating or after eating a small amount of food.  Your skin is pale.  You suddenly lose vision. Summary  Sickle cell anemia is a condition in which red blood cells have an abnormal "sickle" shape. This disease can cause organ damage and chronic pain, and it can raise your risk of infection.  Sickle cell anemia is a genetic disorder.  Treatment focuses on managing your symptoms and preventing complications of the disease.  Get medical help right away if you have any signs of infection, such as a fever. This information is not intended to replace advice given to you by your health care provider. Make sure you discuss any questions you have with your health care provider. Document Revised: 04/04/2019 Document Reviewed: 11/23/2016 Elsevier Patient Education  2020 [employer].

## 2019-12-31 NOTE — ED Provider Notes (Signed)
[facility] DEPT Provider Note   CSN: [id number] Arrival date & time: 12/31/19  [id number]     History Chief Complaint  Patient presents with  . Sickle Cell Pain Crisis    Carmen [name] is a 29 y.o. female.  She is complaining of sickle cell pain in her back and arms that started 4 days ago.  It was somewhat controlled by her regular pain medicine but worsened around 3 AM today.  Not associate with any illness symptoms.  Typical sickle cell crises that she has.  No fevers chills chest pain cough vomiting diarrhea urinary symptoms.  The history is provided by the patient.  Sickle Cell Pain Crisis Location:  Back, upper extremity, L side and R side Severity:  Severe Onset quality:  Gradual Similar to previous crisis episodes: yes   Timing:  Constant Progression:  Worsening Chronicity:  Recurrent Context: not infection   Relieved by:  Nothing Worsened by:  Movement Ineffective treatments:  Prescription drugs Associated symptoms: no chest pain, no cough, no fever, no headaches, no nausea, no shortness of breath, no sore throat, no vision change and no vomiting        Past Medical History:  Diagnosis Date  . Headache(784.0)   . Sickle cell crisis ([place])   . Syphilis 2015   Was diagnosed and received one injection of antibiotics  . Thrombocytosis ([place]) 11/22/2014     CBC Latest Ref Rng & Units 12/13/2018 12/11/2018 12/10/2018 WBC 4.0 - 10.5 K/uL 14.8(H) 13.2(H) 15.9(H) Hemoglobin 12.0 - 15.0 g/dL 8.2(L) 7.7(L) 8.1(L) Hematocrit 36.0 - 46.0 % 23.7(L) 23.2(L) 24.5(L) Platelets 150 - 400 K/uL 326 399 388      Patient Active Problem List   Diagnosis Date Noted  . Sickle cell pain crisis ([place]) 09/10/2019  . Sickle cell disease ([place]) 12/04/2018  . Alpha thalassemia silent carrier 12/04/2018  . Chronic prescription opiate use 03/13/2018  . Chronic musculoskeletal pain 03/13/2018  . Vitamin D deficiency 01/13/2018  . Leukocytosis 08/21/2017  . Cluster B  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[place]) 04/05/2017  . Hb-SS disease without crisis ([place]) 08/15/2016  . Anemia of chronic disease   . Systolic ejection murmur [id number]  . Sickle cell anemia of mother during pregnancy [facility]) 05/16/2014    Past Surgical History:  Procedure Laterality Date  . CESAREAN SECTION N/A 02/05/2019   Procedure: CESAREAN SECTION;  Surgeon: [name], MD;  Location: [facility] ORS;  Service: Obstetrics;  Laterality: N/A;  . CHOLECYSTECTOMY N/A 11/30/2014   Procedure: LAPAROSCOPIC CHOLECYSTECTOMY SINGLE SITE WITH INTRAOPERATIVE CHOLANGIOGRAM;  Surgeon: Santasia Rew [place], MD;  Location: [facility] ORS;  Service: General;  Laterality: N/A;  . SPLENECTOMY       OB History    Gravida  2   Para  1   Term  1   Preterm      AB  1   Living  1     SAB      TAB  1   Ectopic      Multiple  0   Live Births  1           Family History  Problem Relation Age of Onset  . Hypertension Mother   . Sickle cell anemia Sister   . Kidney disease Sister        Lupus  . Arthritis Sister   . Sickle cell anemia Sister   . Sickle cell trait Sister   . Heart disease Maternal Aunt  CABG  . Heart disease Maternal Uncle        CABG  . Lupus Sister     Social History   Tobacco Use  . Smoking status: Current Some Day Smoker    Types: Cigarettes  . Smokeless tobacco: Never Used  Substance Use Topics  . Alcohol use: No  . Drug use: No    Home Medications Prior to Admission medications   Medication Sig Start Date End Date Taking? Authorizing Provider  acetaminophen (TYLENOL) 500 MG tablet Take 2 tablets (1,000 mg total) by mouth 2 (two) times daily as needed. 12/04/19   Azzie Glatter, FNP  aspirin EC 81 MG tablet Take 1 tablet (81 mg total) by mouth daily. 03/15/19   Lanae Boast, FNP  cyclobenzaprine (FLEXERIL) 10 MG tablet Take 1 tablet (10 mg total) by mouth 2 (two) times daily. 12/04/19   Azzie Glatter, FNP  folic acid (FOLVITE) 1 MG tablet Take 1 tablet (1  mg total) by mouth daily. 03/15/19   Lanae Boast, FNP  ibuprofen (ADVIL) 800 MG tablet Take 1 tablet (800 mg total) by mouth 3 (three) times daily with meals as needed. 03/15/19   Lanae Boast, FNP  morphine (MS CONTIN) 60 MG 12 hr tablet Take 1 tablet (60 mg total) by mouth every 12 (twelve) hours. 12/07/19 01/06/20  Azzie Glatter, FNP  Oxycodone HCl 10 MG TABS Take 1 tablet (10 mg total) by mouth every 6 (six) hours as needed for up to 15 days (PAIN). 12/18/19 01/02/20  Azzie Glatter, FNP  Prenatal Vit-Fe Fumarate-FA (PREPLUS) 27-1 MG TABS Take 1 tablet by mouth daily. 08/17/18   [name], MD  Vitamin D, Ergocalciferol, (DRISDOL) 1.25 MG (50000 UT) CAPS capsule TAKE ONE CAPSULE BY MOUTH ONE TIME PER WEEK Patient taking differently: Take 50,000 Units by mouth every 7 (seven) days.  10/11/19   Azzie Glatter, FNP    Allergies    Patient has no known allergies.  Review of Systems   Review of Systems  Constitutional: Negative for fever.  HENT: Negative for sore throat.   Eyes: Negative for visual disturbance.  Respiratory: Negative for cough and shortness of breath.   Cardiovascular: Negative for chest pain.  Gastrointestinal: Negative for abdominal pain, nausea and vomiting.  Genitourinary: Negative for dysuria.  Musculoskeletal: Positive for back pain.  Skin: Negative for rash.  Neurological: Negative for headaches.    Physical Exam Updated Vital Signs BP 101/72 (BP Location: Left Arm)   Pulse (!) 125   Temp 98.5 F (36.9 C) (Oral)   Resp 20   Ht 5\' 4"  (1.626 m)   Wt 74.8 kg   LMP 12/12/2019   SpO2 100%   BMI 28.32 kg/m   Physical Exam Vitals and nursing note reviewed.  Constitutional:      General: She is not in acute distress.    Appearance: She is well-developed.  HENT:     Head: Normocephalic and atraumatic.  Eyes:     Conjunctiva/sclera: Conjunctivae normal.  Cardiovascular:     Rate and Rhythm: Regular rhythm. Tachycardia present.     Heart  sounds: No murmur.  Pulmonary:     Effort: Pulmonary effort is normal. No respiratory distress.     Breath sounds: Normal breath sounds.  Abdominal:     Palpations: Abdomen is soft.     Tenderness: There is no abdominal tenderness.  Musculoskeletal:        General: No deformity or signs of injury. Normal  range of motion.     Cervical back: Neck supple.  Skin:    General: Skin is warm and dry.     Capillary Refill: Capillary refill takes less than 2 seconds.  Neurological:     General: No focal deficit present.     Mental Status: She is alert.     ED Results / Procedures / Treatments   Labs (all labs ordered are listed, but only abnormal results are displayed) Labs Reviewed  RETICULOCYTES - Abnormal; Notable for the following components:      Result Value   Retic Ct Pct 6.7 (*)    RBC. 3.60 (*)    Retic Count, Absolute 242.3 (*)    Immature Retic Fract 40.6 (*)    All other components within normal limits  CBC WITH DIFFERENTIAL/PLATELET - Abnormal; Notable for the following components:   RBC 3.57 (*)    Hemoglobin 10.5 (*)    HCT 30.0 (*)    Platelets 563 (*)    nRBC 1.2 (*)    All other components within normal limits  BASIC METABOLIC PANEL  I-STAT BETA HCG BLOOD, ED ([facility], [facility], [facility] ONLY)    EKG EKG Interpretation  Date/Time:  Monday December 31 2019 08:29:36 EST Ventricular Rate:  103 PR Interval:    QRS Duration: 94 QT Interval:  336 QTC Calculation: 440 R Axis:   43 Text Interpretation: Sinus tachycardia No significant change since 11/20 Confirmed by Aletta Edouard [phone]) on 12/31/2019 8:44:10 AM   Radiology No results found.  Procedures Procedures (including critical care time)  Medications Ordered in ED Medications  HYDROmorphone (DILAUDID) injection 2 mg (2 mg Subcutaneous Given 12/31/19 0902)  HYDROmorphone (DILAUDID) injection 2 mg (2 mg Subcutaneous Given 12/31/19 0947)  diphenhydrAMINE (BENADRYL) injection 25 mg (25 mg Intravenous Given 12/31/19 0902)    HYDROmorphone (DILAUDID) injection 2 mg (2 mg Subcutaneous Given 12/31/19 1053)    ED Course  I have reviewed the triage vital signs and the nursing notes.  Pertinent labs & imaging results that were available during my care of the patient were reviewed by me and considered in my medical decision making (see chart for details).  Clinical Course as of Dec 30 1816  Mon Dec 31, 7630  7080 29 year old female here with sickle cell pain crisis.  She is moderately tachycardic, between 120s to 150s.  Getting IV fluids and standard pain medication.  Differential includes sickle cell pain crisis, dehydration, anemia, metabolic derangement   [MB]  1040 She said her pains down to about an 2:08 doses.  I offered to contact the sickle cell clinic but she says she thinks she would feel okay to go home after third dose.   [MB]  [id number] Patient accepted by NP Hollis at the sickle cell clinic.  They asked if we could leave her IV in place and discharge her and have somebody bring her over.   [MB]    Clinical Course User Index [MB] Hayden Rasmussen, MD   MDM Rules/Calculators/A&P                       Final Clinical Impression(s) / ED Diagnoses Final diagnoses:  Sickle cell pain crisis [facility])    Rx / DC Orders ED Discharge Orders    None       Hayden Rasmussen, MD 12/31/19 1820

## 2019-12-31 NOTE — Discharge Summary (Signed)
[facility] Discharge Summary   Patient ID: Carmen [name] MRN: [id number] DOB/AGE: October 17, 1991 29 y.o.  Admit date: 12/31/2019 Discharge date: 12/31/2019  Primary Care Physician:  Vevelyn Francois, NP  Admission Diagnoses:  Principal Problem:   Sickle cell pain crisis [facility]) Active Problems:   Dysuria   Discharge Medications:  Allergies as of 12/31/2019   No Known Allergies     Medication List    TAKE these medications   acetaminophen 500 MG tablet Commonly known as: TYLENOL Take 2 tablets (1,000 mg total) by mouth 2 (two) times daily as needed.   aspirin EC 81 MG tablet Take 1 tablet (81 mg total) by mouth daily.   cyclobenzaprine 10 MG tablet Commonly known as: FLEXERIL Take 1 tablet (10 mg total) by mouth 2 (two) times daily.   folic acid 1 MG tablet Commonly known as: FOLVITE Take 1 tablet (1 mg total) by mouth daily.   ibuprofen 800 MG tablet Commonly known as: ADVIL Take 1 tablet (800 mg total) by mouth 3 (three) times daily with meals as needed.   morphine 60 MG 12 hr tablet Commonly known as: MS Contin Take 1 tablet (60 mg total) by mouth every 12 (twelve) hours.   nitrofurantoin (macrocrystal-monohydrate) 100 MG capsule Commonly known as: Macrobid Take 1 capsule (100 mg total) by mouth 2 (two) times daily for 7 days.   Oxycodone HCl 10 MG Tabs Take 1 tablet (10 mg total) by mouth every 6 (six) hours as needed for up to 15 days (PAIN).   PrePLUS 27-1 MG Tabs Take 1 tablet by mouth daily.   Vitamin D (Ergocalciferol) 1.25 MG (50000 UNIT) Caps capsule Commonly known as: DRISDOL TAKE ONE CAPSULE BY MOUTH ONE TIME PER WEEK What changed: See the new instructions.        Consults:  None  Significant Diagnostic Studies:  No results found.  History of present illness: Carmen [name], a 29 year old female with a medical history significant for sickle cell disease, chronic pain syndrome, opiate dependence and tolerance, and history of  anemia of chronic disease presents complaining of generalized pain that is consistent with her typical crisis.  Patient has been taking home medications around-the-clock without sustained relief.  She last had MS Contin and oxycodone this a.m.  Patient was treated and evaluated in the emergency department with some relief.  Agree with ER provider that patient is appropriate to transition to sickle cell day infusion center for pain management and extended observation.  Pain intensity 8/10 characterized as constant and throbbing.  Patient states that she has been unable to get comfortable.  No sick contacts, recent travel, or exposure to COVID-19.  Patient denies headache, chest pain, fever, chills, shortness of breath, nausea, vomiting, or diarrhea.  Patient endorses urinary frequency and dysuria over the past several days.     [facility] Course: Sickle cell disease with pain crisis: Patient transition from ER for pain management and extended observation. Reviewed all laboratory values, consistent with patient's baseline. Pain managed with IV Dilaudid via PCA.  Settings of 0.5 mg, 10-minute lockout, and 3 mg/h. Tylenol 1000 mg by mouth x1 Oxycodone 10 mg by mouth x1 IV fluids, 0.45% saline at 100 mL/h. Pain intensity decreased to 5/10.  Patient does not warrant admission on today. Patient advised to follow-up with PCP as scheduled. She is alert, oriented, and ambulating without assistance. Patient will discharge home in a hemodynamically stable condition.    Dysuria: Urinalysis shows  small leukocytes.  Patient complaining of dysuria and urinary frequency.  Treat empirically.  Macrobid 100 mg twice daily for a total of 7 days.  Also, patient to follow-up in primary care for vaginal swab.  She is sexually active.  Recommend testing for sexually transmitted infections.  Patient expressed understanding. Discharge instructions: Resume all home medications.   Follow up with PCP as  previously  scheduled.   Discussed the importance of drinking 64 ounces of water daily, dehydration of red blood cells may lead further sickling.   Avoid all stressors that precipitate sickle cell pain crisis.     The patient was given clear instructions to go to ER or return to medical center if symptoms do not improve, worsen or new problems develop.    Physical Exam at Discharge:  BP 113/61 (BP Location: Right Arm)   Pulse 63   Temp 98.6 F (37 C) (Oral)   Resp 16   LMP 12/12/2019   SpO2 95%  Physical Exam Constitutional:      General: She is not in acute distress.    Appearance: She is normal weight.  HENT:     Mouth/Throat:     Mouth: Mucous membranes are moist.  Eyes:     Pupils: Pupils are equal, round, and reactive to light.  Cardiovascular:     Rate and Rhythm: Normal rate and regular rhythm.     Pulses: Normal pulses.  Pulmonary:     Effort: Pulmonary effort is normal.  Abdominal:     General: Bowel sounds are normal.  Neurological:     Mental Status: She is alert.       Disposition at Discharge: Discharge disposition: 01-Home or Self Care       Discharge Orders: Discharge Instructions    Discharge patient   Complete by: As directed    Discharge disposition: 01-Home or Self Care   Discharge patient date: 12/31/2019      Condition at Discharge:   Stable  Time spent on Discharge:  Greater than 30 minutes.  Signed: [name]  APRN, MSN, FNP-C Patient [facility] [address] [place], [place] [address] [phone]  12/31/2019, 4:28 PM

## 2020-01-01 ENCOUNTER — Encounter (HOSPITAL_COMMUNITY): Payer: Self-pay

## 2020-01-01 ENCOUNTER — Other Ambulatory Visit: Payer: Self-pay

## 2020-01-01 ENCOUNTER — Inpatient Hospital Stay (HOSPITAL_COMMUNITY)
Admission: EM | Admit: 2020-01-01 | Discharge: 2020-01-03 | DRG: 812 | Disposition: A | Discharge status: Home / Self Care | Payer: Medicaid Other | Attending: Internal Medicine | Admitting: Internal Medicine

## 2020-01-01 ENCOUNTER — Emergency Department (HOSPITAL_COMMUNITY): Payer: Medicaid Other

## 2020-01-01 DIAGNOSIS — F6089 Other specific personality disorders: Secondary | ICD-10-CM | POA: Diagnosis present

## 2020-01-01 DIAGNOSIS — B9689 Other specified bacterial agents as the cause of diseases classified elsewhere: Secondary | ICD-10-CM

## 2020-01-01 DIAGNOSIS — F17210 Nicotine dependence, cigarettes, uncomplicated: Secondary | ICD-10-CM | POA: Diagnosis not present

## 2020-01-01 DIAGNOSIS — G894 Chronic pain syndrome: Secondary | ICD-10-CM | POA: Diagnosis present

## 2020-01-01 DIAGNOSIS — R300 Dysuria: Secondary | ICD-10-CM

## 2020-01-01 DIAGNOSIS — N760 Acute vaginitis: Secondary | ICD-10-CM | POA: Diagnosis not present

## 2020-01-01 DIAGNOSIS — Z79891 Long term (current) use of opiate analgesic: Secondary | ICD-10-CM

## 2020-01-01 DIAGNOSIS — Z20822 Contact with and (suspected) exposure to covid-19: Secondary | ICD-10-CM | POA: Diagnosis present

## 2020-01-01 DIAGNOSIS — Z7982 Long term (current) use of aspirin: Secondary | ICD-10-CM

## 2020-01-01 DIAGNOSIS — Z9049 Acquired absence of other specified parts of digestive tract: Secondary | ICD-10-CM | POA: Diagnosis not present

## 2020-01-01 DIAGNOSIS — Z832 Family history of diseases of the blood and blood-forming organs and certain disorders involving the immune mechanism: Secondary | ICD-10-CM | POA: Diagnosis not present

## 2020-01-01 DIAGNOSIS — N3000 Acute cystitis without hematuria: Secondary | ICD-10-CM | POA: Diagnosis present

## 2020-01-01 DIAGNOSIS — Z79899 Other long term (current) drug therapy: Secondary | ICD-10-CM | POA: Diagnosis not present

## 2020-01-01 DIAGNOSIS — D638 Anemia in other chronic diseases classified elsewhere: Secondary | ICD-10-CM | POA: Diagnosis present

## 2020-01-01 DIAGNOSIS — Z9081 Acquired absence of spleen: Secondary | ICD-10-CM | POA: Diagnosis not present

## 2020-01-01 DIAGNOSIS — D571 Sickle-cell disease without crisis: Secondary | ICD-10-CM | POA: Diagnosis not present

## 2020-01-01 DIAGNOSIS — D5700 Hb-SS disease with crisis, unspecified: Secondary | ICD-10-CM | POA: Diagnosis present

## 2020-01-01 LAB — CBC WITH DIFFERENTIAL/PLATELET
Abs Immature Granulocytes: 0.05 10*3/uL (ref 0.00–0.07)
Basophils Absolute: 0 10*3/uL (ref 0.0–0.1)
Basophils Relative: 0 %
Eosinophils Absolute: 0.2 10*3/uL (ref 0.0–0.5)
Eosinophils Relative: 2 %
HCT: 30.4 % — ABNORMAL LOW (ref 36.0–46.0)
Hemoglobin: 10.5 g/dL — ABNORMAL LOW (ref 12.0–15.0)
Immature Granulocytes: 1 %
Lymphocytes Relative: 19 %
Lymphs Abs: 1.9 10*3/uL (ref 0.7–4.0)
MCH: 29 pg (ref 26.0–34.0)
MCHC: 34.5 g/dL (ref 30.0–36.0)
MCV: 84 fL (ref 80.0–100.0)
Monocytes Absolute: 0.9 10*3/uL (ref 0.1–1.0)
Monocytes Relative: 9 %
Neutro Abs: 7 10*3/uL (ref 1.7–7.7)
Neutrophils Relative %: 69 %
Platelets: 625 10*3/uL — ABNORMAL HIGH (ref 150–400)
RBC: 3.62 MIL/uL — ABNORMAL LOW (ref 3.87–5.11)
RDW: 14.1 % (ref 11.5–15.5)
WBC: 10 10*3/uL (ref 4.0–10.5)
nRBC: 0.7 % — ABNORMAL HIGH (ref 0.0–0.2)

## 2020-01-01 LAB — COMPREHENSIVE METABOLIC PANEL
ALT: 29 U/L (ref 0–44)
AST: 24 U/L (ref 15–41)
Albumin: 4 g/dL (ref 3.5–5.0)
Alkaline Phosphatase: 70 U/L (ref 38–126)
Anion gap: 10 (ref 5–15)
BUN: 6 mg/dL (ref 6–20)
CO2: 22 mmol/L (ref 22–32)
Calcium: 9.6 mg/dL (ref 8.9–10.3)
Chloride: 107 mmol/L (ref 98–111)
Creatinine, Ser: 0.54 mg/dL (ref 0.44–1.00)
GFR calc Af Amer: >60 mL/min (ref >60)
GFR calc non Af Amer: >60 mL/min (ref >60)
Glucose, Bld: 90 mg/dL (ref 70–99)
Potassium: 4.1 mmol/L (ref 3.5–5.1)
Sodium: 139 mmol/L (ref 135–145)
Total Bilirubin: 1.1 mg/dL (ref 0.3–1.2)
Total Protein: 7.5 g/dL (ref 6.5–8.1)

## 2020-01-01 LAB — URINE CULTURE

## 2020-01-01 LAB — I-STAT BETA HCG BLOOD, ED (MC, WL, AP ONLY): I-stat hCG, quantitative: <5 m[IU]/mL (ref <5)

## 2020-01-01 LAB — SARS CORONAVIRUS 2 (TAT 6-24 HRS): SARS Coronavirus 2: NEGATIVE

## 2020-01-01 MED ORDER — ASPIRIN EC 81 MG PO TBEC
81.0000 mg | DELAYED_RELEASE_TABLET | Freq: Every day | ORAL | Status: DC
  Administered 2020-01-02 – 2020-01-03 (×2): 81 mg via ORAL
  Filled 2020-01-01 (×2): qty 1

## 2020-01-01 MED ORDER — ONDANSETRON HCL 4 MG/2ML IJ SOLN
4.0000 mg | Freq: Four times a day (QID) | INTRAMUSCULAR | Status: DC | PRN
  Administered 2020-01-01 – 2020-01-02 (×3): 4 mg via INTRAVENOUS
  Filled 2020-01-01 (×3): qty 2

## 2020-01-01 MED ORDER — SODIUM CHLORIDE 0.9% FLUSH: 9.0000 mL | INTRAVENOUS | Status: DC | PRN

## 2020-01-01 MED ORDER — HYDROMORPHONE HCL 1 MG/ML IJ SOLN
1.0000 mg | Freq: Once | INTRAMUSCULAR | Status: AC
  Administered 2020-01-01: 1 mg via SUBCUTANEOUS
  Filled 2020-01-01: qty 1

## 2020-01-01 MED ORDER — SODIUM CHLORIDE 0.9 % IV SOLN
1.0000 g | INTRAVENOUS | Status: DC
  Administered 2020-01-02: 1 g via INTRAVENOUS
  Filled 2020-01-01: qty 10
  Filled 2020-01-01: qty 1

## 2020-01-01 MED ORDER — ENOXAPARIN SODIUM 40 MG/0.4ML ~~LOC~~ SOLN
40.0000 mg | SUBCUTANEOUS | Status: DC
  Filled 2020-01-01: qty 0.4

## 2020-01-01 MED ORDER — SODIUM CHLORIDE 0.9 % IV SOLN
1.0000 g | Freq: Once | INTRAVENOUS | Status: AC
  Administered 2020-01-01: 1 g via INTRAVENOUS
  Filled 2020-01-01: qty 10

## 2020-01-01 MED ORDER — MORPHINE SULFATE ER 15 MG PO TBCR
60.0000 mg | EXTENDED_RELEASE_TABLET | Freq: Two times a day (BID) | ORAL | Status: DC
  Administered 2020-01-01 – 2020-01-03 (×4): 60 mg via ORAL
  Filled 2020-01-01 (×4): qty 4

## 2020-01-01 MED ORDER — HYDROMORPHONE HCL 1 MG/ML IJ SOLN: 1.0000 mg | Freq: Once | INTRAMUSCULAR | Status: DC

## 2020-01-01 MED ORDER — SENNOSIDES-DOCUSATE SODIUM 8.6-50 MG PO TABS
1.0000 | ORAL_TABLET | Freq: Two times a day (BID) | ORAL | Status: DC
  Administered 2020-01-01 – 2020-01-02 (×3): 1 via ORAL
  Filled 2020-01-01 (×3): qty 1

## 2020-01-01 MED ORDER — FOLIC ACID 1 MG PO TABS
1.0000 mg | ORAL_TABLET | Freq: Every day | ORAL | Status: DC
  Administered 2020-01-02 – 2020-01-03 (×2): 1 mg via ORAL
  Filled 2020-01-01 (×2): qty 1

## 2020-01-01 MED ORDER — POLYETHYLENE GLYCOL 3350 17 G PO PACK: 17.0000 g | PACK | Freq: Every day | ORAL | Status: DC | PRN

## 2020-01-01 MED ORDER — KETOROLAC TROMETHAMINE 30 MG/ML IJ SOLN
30.0000 mg | Freq: Once | INTRAMUSCULAR | Status: AC
  Administered 2020-01-01: 30 mg via INTRAMUSCULAR
  Filled 2020-01-01: qty 1

## 2020-01-01 MED ORDER — KETOROLAC TROMETHAMINE 15 MG/ML IJ SOLN
15.0000 mg | Freq: Four times a day (QID) | INTRAMUSCULAR | Status: DC
  Administered 2020-01-01 – 2020-01-03 (×8): 15 mg via INTRAVENOUS
  Filled 2020-01-01 (×8): qty 1

## 2020-01-01 MED ORDER — SODIUM CHLORIDE 0.45 % IV SOLN: INTRAVENOUS | Status: DC

## 2020-01-01 MED ORDER — DIPHENHYDRAMINE HCL 25 MG PO CAPS
25.0000 mg | ORAL_CAPSULE | ORAL | Status: DC | PRN
  Administered 2020-01-02: 25 mg via ORAL
  Filled 2020-01-01 (×2): qty 1

## 2020-01-01 MED ORDER — NALOXONE HCL 0.4 MG/ML IJ SOLN: 0.4000 mg | INTRAMUSCULAR | Status: DC | PRN

## 2020-01-01 MED ORDER — HYDROMORPHONE 1 MG/ML IV SOLN
INTRAVENOUS | Status: DC
  Administered 2020-01-01 – 2020-01-02 (×2): 30 mg via INTRAVENOUS
  Filled 2020-01-01 (×2): qty 30

## 2020-01-01 NOTE — Progress Notes (Signed)
Arrived to room, MD assessing for IV with ultrasound.

## 2020-01-01 NOTE — Plan of Care (Signed)

## 2020-01-01 NOTE — ED Triage Notes (Signed)
Pt reports continued sickle cell pain in her back, arms and legs. Pt seen at [facility] last night for the same and called the sickle cell clinic but they were full so she came here.

## 2020-01-01 NOTE — ED Notes (Signed)
Report called to Alex RN.

## 2020-01-01 NOTE — Progress Notes (Signed)
Patient has arrived by EMS via stretcher. Oriented to room and call bell. Awaiting orders to be verified. Call bell in reach.

## 2020-01-01 NOTE — ED Notes (Signed)
[facility] called/Transfer to [facility] @ 1715-per Adam, RN called by Levada Dy

## 2020-01-01 NOTE — H&P (Signed)
H&P  Patient Demographics:  Carmen [name], is a 29 y.o. female  MRN: [id number]   DOB - 1991-09-27  Admit Date - 01/01/2020  Outpatient Primary MD for the patient is Vevelyn Francois, NP  Chief Complaint  Patient presents with  . Sickle Cell Pain Crisis      HPI:   Carmen [name]  is a 29 y.o. female with a medical history significant for sickle cell disease, chronic pain syndrome, opiate dependence and tolerance, and history of anemia of chronic disease presented to emergency department complaining of generalized pain that is consistent with past pain crisis.  Patient says the pain intensity has been elevated over the past week and has been unrelieved by home medications.  She was treated and evaluated in the emergency department on 12/31/2019 and transition to sickle cell day infusion center for pain management and extended observation.  Patient says at discharge pain was well controlled.  She awakened this a.m. with sharp, consistent pain primarily to low back and lower extremities.  Patient also endorses pain with urination.  She was started on Macrobid on yesterday, but has not started medication. Patient denies headache, chest pain, shortness of breath, nausea, vomiting, or diarrhea.  No sick contacts, recent travel, or exposure to COVID-19.  ER course: Hemoglobin 10.5, consistent with patient's baseline.  All other labs reassuring.  Vital signs mostly normal.  COVID-19 test pending.  Patient's pain persists despite several doses of IV Dilaudid, IV Toradol, and IV fluids.  Patient will transfer to [facility] for management of sickle cell pain crisis.   Review of systems:  In addition to the HPI above, patient reports Review of Systems  Constitutional: Negative.   HENT: Negative.   Eyes: Negative.   Respiratory: Negative.   Gastrointestinal: Negative.   Musculoskeletal: Positive for back pain and joint pain.  Skin: Negative.   Neurological: Negative.   Endo/Heme/Allergies:  Negative.   Psychiatric/Behavioral: Negative.    A full 10 point Review of Systems was done, except as stated above, all other Review of Systems were negative.  With Past History of the following :   Past Medical History:  Diagnosis Date  . Headache(784.0)   . Sickle cell crisis ([place])   . Syphilis 2015   Was diagnosed and received one injection of antibiotics  . Thrombocytosis ([place]) 11/22/2014     CBC Latest Ref Rng & Units 12/13/2018 12/11/2018 12/10/2018 WBC 4.0 - 10.5 K/uL 14.8(H) 13.2(H) 15.9(H) Hemoglobin 12.0 - 15.0 g/dL 8.2(L) 7.7(L) 8.1(L) Hematocrit 36.0 - 46.0 % 23.7(L) 23.2(L) 24.5(L) Platelets 150 - 400 K/uL 326 399 388        Past Surgical History:  Procedure Laterality Date  . CESAREAN SECTION N/A 02/05/2019   Procedure: CESAREAN SECTION;  Surgeon: [name], MD;  Location: [facility] LD ORS;  Service: Obstetrics;  Laterality: N/A;  . CHOLECYSTECTOMY N/A 11/30/2014   Procedure: LAPAROSCOPIC CHOLECYSTECTOMY SINGLE SITE WITH INTRAOPERATIVE CHOLANGIOGRAM;  Surgeon: Michael Boston, MD;  Location: [facility] ORS;  Service: General;  Laterality: N/A;  . SPLENECTOMY       Social History:   Social History   Tobacco Use  . Smoking status: Current Some Day Smoker    Types: Cigarettes  . Smokeless tobacco: Never Used  Substance Use Topics  . Alcohol use: No     Lives - At home   Family History :   Family History  Problem Relation Age of Onset  . Hypertension Mother   . Sickle cell anemia Sister   .  Kidney disease Sister        Lupus  . Arthritis Sister   . Sickle cell anemia Sister   . Sickle cell trait Sister   . Heart disease Maternal Aunt        CABG  . Heart disease Maternal Uncle        CABG  . Lupus Sister      Home Medications:   Prior to Admission medications   Medication Sig Start Date End Date Taking? Authorizing Provider  acetaminophen (TYLENOL) 500 MG tablet Take 2 tablets (1,000 mg total) by mouth 2 (two) times daily as needed. 12/04/19  Yes Azzie Glatter,  FNP  aspirin EC 81 MG tablet Take 1 tablet (81 mg total) by mouth daily. 03/15/19  Yes Lanae Boast, FNP  cyclobenzaprine (FLEXERIL) 10 MG tablet Take 1 tablet (10 mg total) by mouth 2 (two) times daily. 12/04/19  Yes Azzie Glatter, FNP  folic acid (FOLVITE) 1 MG tablet Take 1 tablet (1 mg total) by mouth daily. 03/15/19  Yes Lanae Boast, FNP  ibuprofen (ADVIL) 800 MG tablet Take 1 tablet (800 mg total) by mouth 3 (three) times daily with meals as needed. 03/15/19  Yes Lanae Boast, FNP  morphine (MS CONTIN) 60 MG 12 hr tablet Take 1 tablet (60 mg total) by mouth every 12 (twelve) hours. 12/07/19 01/06/20 Yes Azzie Glatter, FNP  Oxycodone HCl 10 MG TABS Take 1 tablet (10 mg total) by mouth every 6 (six) hours as needed for up to 15 days (PAIN). 12/18/19 01/02/20 Yes Azzie Glatter, FNP  Prenatal Vit-Fe Fumarate-FA (PREPLUS) 27-1 MG TABS Take 1 tablet by mouth daily. 08/17/18  Yes [name], MD  Vitamin D, Ergocalciferol, (DRISDOL) 1.25 MG (50000 UT) CAPS capsule TAKE ONE CAPSULE BY MOUTH ONE TIME PER WEEK Patient taking differently: Take 50,000 Units by mouth every 7 (seven) days.  10/11/19  Yes Azzie Glatter, FNP  nitrofurantoin, macrocrystal-monohydrate, (MACROBID) 100 MG capsule Take 1 capsule (100 mg total) by mouth 2 (two) times daily for 7 days. 12/31/19 01/07/20  [name], FNP     Allergies:   No Known Allergies   Physical Exam:   Vitals:   Vitals:   01/01/20 1515 01/01/20 1530  BP: 112/70 114/76  Pulse: 79 89  Resp:    Temp:    SpO2: 96% 99%    Physical Exam: Constitutional: Patient appears well-developed and well-nourished. Not in obvious distress. HENT: Normocephalic, atraumatic, External right and left ear normal. Oropharynx is clear and moist.  Eyes: Conjunctivae and EOM are normal. PERRLA, no scleral icterus. Neck: Normal ROM. Neck supple. No JVD. No tracheal deviation. No thyromegaly. CVS: RRR, S1/S2 +, no murmurs, no gallops, no carotid bruit.   Pulmonary: Effort and breath sounds normal, no stridor, rhonchi, wheezes, rales.  Abdominal: Soft. BS +, no distension, tenderness, rebound or guarding.  Musculoskeletal: Normal range of motion. No edema and no tenderness.  Lymphadenopathy: No lymphadenopathy noted, cervical, inguinal or axillary Neuro: Alert. Normal reflexes, muscle tone coordination. No cranial nerve deficit. Skin: Skin is warm and dry. No rash noted. Not diaphoretic. No erythema. No pallor. Psychiatric: Normal mood and affect. Behavior, judgment, thought content normal.   Data Review:   CBC Recent Labs  Lab 12/31/19 0900 01/01/20 1142  WBC 8.5 10.0  HGB 10.5* 10.5*  HCT 30.0* 30.4*  PLT 563* 625*  MCV 84.0 84.0  MCH 29.4 29.0  MCHC 35.0 34.5  RDW 14.2 14.1  LYMPHSABS 3.5  1.9  MONOABS 0.8 0.9  EOSABS 0.3 0.2  BASOSABS 0.1 0.0   ------------------------------------------------------------------------------------------------------------------  Chemistries  Recent Labs  Lab 12/31/19 0900 01/01/20 1142  NA 139 139  K 4.2 4.1  CL 107 107  CO2 25 22  GLUCOSE 98 90  BUN 8 6  CREATININE 0.49 0.54  CALCIUM 9.3 9.6  AST  --  24  ALT  --  29  ALKPHOS  --  70  BILITOT  --  1.1   ------------------------------------------------------------------------------------------------------------------ estimated creatinine clearance is 107.7 mL/min (by C-G formula based on SCr of 0.54 mg/dL). ------------------------------------------------------------------------------------------------------------------ No results for input(s): TSH, T4TOTAL, T3FREE, THYROIDAB in the last 72 hours.  Invalid input(s): FREET3  Coagulation profile No results for input(s): INR, PROTIME in the last 168 hours. ------------------------------------------------------------------------------------------------------------------- No results for input(s): DDIMER in the last 72  hours. -------------------------------------------------------------------------------------------------------------------  Cardiac Enzymes No results for input(s): CKMB, TROPONINI, MYOGLOBIN in the last 168 hours.  Invalid input(s): CK ------------------------------------------------------------------------------------------------------------------ No results found for: BNP  ---------------------------------------------------------------------------------------------------------------  Urinalysis    Component Value Date/Time   COLORURINE YELLOW 12/31/2019 1213   APPEARANCEUR CLEAR 12/31/2019 1213   LABSPEC 1.011 12/31/2019 1213   PHURINE 6.0 12/31/2019 1213   GLUCOSEU NEGATIVE 12/31/2019 1213   HGBUR NEGATIVE 12/31/2019 1213   BILIRUBINUR NEGATIVE 12/31/2019 1213   BILIRUBINUR Negative 08/10/2019 1534   KETONESUR NEGATIVE 12/31/2019 1213   PROTEINUR NEGATIVE 12/31/2019 1213   UROBILINOGEN 1.0 08/10/2019 1534   UROBILINOGEN 1.0 01/12/2018 0943   NITRITE NEGATIVE 12/31/2019 1213   LEUKOCYTESUR SMALL (A) 12/31/2019 1213    ----------------------------------------------------------------------------------------------------------------   Imaging Results:    CT Renal Stone Study  Result Date: 01/01/2020 CLINICAL DATA:  Flank pain. Sickle cell disease. EXAM: CT ABDOMEN AND PELVIS WITHOUT CONTRAST TECHNIQUE: Multidetector CT imaging of the abdomen and pelvis was performed following the standard protocol without IV contrast. COMPARISON:  None. FINDINGS: Lower chest: There is minimal atelectasis or scarring at the lung bases. Heart size is normal. Hepatobiliary: No focal liver abnormality is seen. Status post cholecystectomy. No biliary dilatation. Pancreas: Unremarkable. No pancreatic ductal dilatation or surrounding inflammatory changes. Spleen: Removed. Adrenals/Urinary Tract: Adrenal glands are unremarkable. Kidneys are normal, without renal calculi, focal lesion, or hydronephrosis.  Bladder is unremarkable. Tiny calcification in the right side of the pelvis is likely a phlebolith. Stomach/Bowel: Stomach is within normal limits. Appendix appears normal. No evidence of bowel wall thickening, distention, or inflammatory changes. Vascular/Lymphatic: No significant vascular findings are present. No enlarged abdominal or pelvic lymph nodes. Reproductive: The uterus is normal. There are low-density lesions in both adnexal regions, 4.5 mm on the right and 5.3 mm on the left as well as a small amount of free fluid in the pelvis. Other: No abdominal wall hernia or abnormality. Musculoskeletal: Diffuse sclerosis of the bones, most prominent in the proximal femurs and in the pelvic bones consistent with the patient's history of sickle cell disease. IMPRESSION: 1. No acute abnormality of the abdomen or pelvis. 2. Small low-density lesions in both adnexal regions, probably representing cysts. 3. Diffuse sclerosis of the bones consistent with the patient's history of sickle cell disease. Electronically Signed   By: Lorriane Shire M.D.   On: 01/01/2020 13:38      Assessment & Plan:  Principal Problem:   Sickle cell pain crisis ([place]) Active Problems:   Chronic prescription opiate use   Dysuria   Sickle cell disease with pain crisis: Patient's pain persists despite IV Dilaudid.  Admit to MedSurg. IV fluids, 0.45% saline at  100 mL/h. IV Dilaudid PCA with settings of 0.6 mg, 10-minute lockout, and 4 mg/h. Toradol 15 mg IV every 6 hours for total of 5 days Monitor vital signs closely Reevaluate pain scale regularly Supplemental oxygen as needed Patient will be reevaluated for pain in the context of functioning and relationship to baseline as her care progresses.  Sickle cell anemia: Hemoglobin 10.5, consistent with patient's baseline.  No clinical indication for blood transfusion.  Continue to monitor closely. Continue folic acid 1 mg daily  Chronic pain syndrome: MS Contin 60 mg every 12  hours Hold oxycodone, use PCA Dilaudid as substitute  Probable urinary tract infection: Reviewed culture from 12/31/2019, recollection suggested multiple species present.  Patient received 1 g IV Rocephin in emergency department, continue daily.  Recollect sample and follow urine culture.   DVT Prophylaxis: Subcut Lovenox and SCDs  AM Labs Ordered, also please review Full Orders  Family Communication: Admission, patient's condition and plan of care including tests being ordered have been discussed with the patient who indicate understanding and agree with the plan and Code Status.  Code Status: Full Code  Consults called: None    Admission status: Inpatient    Time spent in minutes : 50 minutes  [place], MSN, FNP-C Patient [facility] [address] [place], [place] [address] [phone]  01/01/2020 at 5:28 PM This note was prepared using Dragon speech recognition software, errors in dictation are unintentional.

## 2020-01-01 NOTE — ED Provider Notes (Addendum)
Carmen [name]   CSN: [id number] Arrival date & time: 01/01/20  1020     History Chief Complaint  Patient presents with  . Sickle Cell Pain Crisis    Carmen [name] is a 29 y.o. female with history of sickle cell anemia, cluster B personality disorder presents for acute onset, persistent back and extremity pain for 5 days. Reports constant sharp pains all over her spine and in the upper and lower extremities. Reports pain is consistent with her usual sickle cell pain crises. She has been attempting to take her home medicines of MS Contin Flexeril, and ibuprofen without relief of symptoms. She has had no bowel or bladder incontinence, saddle anesthesia, or fevers. Denies chest pain, shortness of breath, abdominal pain, nausea, or vomiting. She has had urinary frequency and dysuria. She went to [facility] long emergency department yesterday was found to be stable for transfer to the sickle cell clinic where her pain was controlled and she was noted to possibly have a UTI. She was discharged with a prescription for [facility] which she has not taken yet. She states that her pain had improved yesterday but worsened today and is now 10/10 in severity. He took her home medicine this morning without relief of her symptoms so she went to the sickle cell clinic but reports that they were "full" so she came here for further evaluation and management.  The history is provided by the patient.       Past Medical History:  Diagnosis Date  . Headache(784.0)   . Sickle cell crisis ([place])   . Syphilis 2015   Was diagnosed and received one injection of antibiotics  . Thrombocytosis ([place]) 11/22/2014     CBC Latest Ref Rng & Units 12/13/2018 12/11/2018 12/10/2018 WBC 4.0 - 10.5 K/uL 14.8(H) 13.2(H) 15.9(H) Hemoglobin 12.0 - 15.0 g/dL 8.2(L) 7.7(L) 8.1(L) Hematocrit 36.0 - 46.0 % 23.7(L) 23.2(L) 24.5(L) Platelets 150 - 400 K/uL 326 399 388      Patient Active Problem  List   Diagnosis Date Noted  . Dysuria 12/31/2019  . Urine leukocytes   . Sickle cell pain crisis ([place]) 09/10/2019  . Sickle cell disease ([place]) 12/04/2018  . Alpha thalassemia silent carrier 12/04/2018  . Chronic prescription opiate use 03/13/2018  . Chronic musculoskeletal pain 03/13/2018  . Vitamin D deficiency 01/13/2018  . Leukocytosis 08/21/2017  . 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[place]) 04/05/2017  . Hb-SS disease without crisis ([place]) 08/15/2016  . Anemia of chronic disease   . Systolic ejection murmur [id number]  . Sickle cell anemia of mother during pregnancy [facility]) 05/16/2014    Past Surgical History:  Procedure Laterality Date  . CESAREAN SECTION N/A 02/05/2019   Procedure: CESAREAN SECTION;  Surgeon: [name], MD;  Location: [facility] LD ORS;  Service: Obstetrics;  Laterality: N/A;  . CHOLECYSTECTOMY N/A 11/30/2014   Procedure: LAPAROSCOPIC CHOLECYSTECTOMY SINGLE SITE WITH INTRAOPERATIVE CHOLANGIOGRAM;  Surgeon: Michael Boston, MD;  Location: [facility] ORS;  Service: General;  Laterality: N/A;  . SPLENECTOMY       OB History    Gravida  2   Para  1   Term  1   Preterm      AB  1   Living  1     SAB      TAB  1   Ectopic      Multiple  0   Live Births  1  Family History  Problem Relation Age of Onset  . Hypertension Mother   . Sickle cell anemia Sister   . Kidney disease Sister        Lupus  . Arthritis Sister   . Sickle cell anemia Sister   . Sickle cell trait Sister   . Heart disease Maternal Aunt        CABG  . Heart disease Maternal Uncle        CABG  . Lupus Sister     Social History   Tobacco Use  . Smoking status: Current Some Day Smoker    Types: Cigarettes  . Smokeless tobacco: Never Used  Substance Use Topics  . Alcohol use: No  . Drug use: No    Home Medications Prior to Admission medications   Medication Sig Start Date End Date Taking? Authorizing Provider  acetaminophen (TYLENOL) 500 MG tablet Take 2  tablets (1,000 mg total) by mouth 2 (two) times daily as needed. 12/04/19  Yes Azzie Glatter, FNP  aspirin EC 81 MG tablet Take 1 tablet (81 mg total) by mouth daily. 03/15/19  Yes Lanae Boast, FNP  cyclobenzaprine (FLEXERIL) 10 MG tablet Take 1 tablet (10 mg total) by mouth 2 (two) times daily. 12/04/19  Yes Azzie Glatter, FNP  folic acid (FOLVITE) 1 MG tablet Take 1 tablet (1 mg total) by mouth daily. 03/15/19  Yes Lanae Boast, FNP  ibuprofen (ADVIL) 800 MG tablet Take 1 tablet (800 mg total) by mouth 3 (three) times daily with meals as needed. 03/15/19  Yes Lanae Boast, FNP  morphine (MS CONTIN) 60 MG 12 hr tablet Take 1 tablet (60 mg total) by mouth every 12 (twelve) hours. 12/07/19 01/06/20 Yes Azzie Glatter, FNP  Oxycodone HCl 10 MG TABS Take 1 tablet (10 mg total) by mouth every 6 (six) hours as needed for up to 15 days (PAIN). 12/18/19 01/02/20 Yes Azzie Glatter, FNP  Prenatal Vit-Fe Fumarate-FA (PREPLUS) 27-1 MG TABS Take 1 tablet by mouth daily. 08/17/18  Yes [name], MD  Vitamin D, Ergocalciferol, (DRISDOL) 1.25 MG (50000 UT) CAPS capsule TAKE ONE CAPSULE BY MOUTH ONE TIME PER WEEK Patient taking differently: Take 50,000 Units by mouth every 7 (seven) days.  10/11/19  Yes Azzie Glatter, FNP  nitrofurantoin, macrocrystal-monohydrate, (MACROBID) 100 MG capsule Take 1 capsule (100 mg total) by mouth 2 (two) times daily for 7 days. 12/31/19 01/07/20  [name], FNP    Allergies    Patient has no known allergies.  Review of Systems   Review of Systems  Constitutional: Negative for chills and fever.  Respiratory: Negative for shortness of breath.   Cardiovascular: Negative for chest pain.  Gastrointestinal: Negative for abdominal pain, constipation, diarrhea, nausea and vomiting.  Genitourinary: Positive for dysuria and frequency.  Musculoskeletal: Positive for back pain and myalgias.  Neurological: Negative for weakness and numbness.  All other systems  reviewed and are negative.   Physical Exam Updated Vital Signs BP 114/76   Pulse 89   Temp 98.6 F (37 C) (Oral)   Resp 20   Ht 5\' 4"  (1.626 m)   Wt 78 kg   LMP 12/12/2019   SpO2 99%   BMI 29.52 kg/m   Physical Exam Vitals and nursing [name] reviewed.  Constitutional:      General: She is not in acute distress.    Appearance: She is well-developed.     Comments: Tearful  HENT:     Head: Normocephalic  and atraumatic.  Eyes:     General:        Right eye: No discharge.        Left eye: No discharge.     Conjunctiva/sclera: Conjunctivae normal.  Neck:     Vascular: No JVD.     Trachea: No tracheal deviation.  Cardiovascular:     Rate and Rhythm: Regular rhythm. Tachycardia present.     Pulses: Normal pulses.     Comments: Mildly tachycardic up to 106 bpm. Pulmonary:     Effort: Pulmonary effort is normal.     Breath sounds: Normal breath sounds.  Abdominal:     General: Abdomen is flat. Bowel sounds are normal. There is no distension.     Palpations: Abdomen is soft.     Tenderness: There is no abdominal tenderness. There is right CVA tenderness and left CVA tenderness. There is no guarding or rebound.  Musculoskeletal:        General: Tenderness present.     Cervical back: Normal range of motion and neck supple.     Comments: Diffuse midline spine tenderness with bilateral paraspinal muscle tenderness. No deformity, crepitus, or step-off. 5/5 strength of BUE and BLE major muscle groups.  Skin:    General: Skin is warm and dry.     Findings: No erythema.  Neurological:     Mental Status: She is alert.     Comments: Is all extremities spontaneously without difficulty. Speech is fluent and goal oriented. Cranial nerves appear grossly intact.  Psychiatric:        Behavior: Behavior normal.     ED Results / Procedures / Treatments   Labs (all labs ordered are listed, but only abnormal results are displayed) Labs Reviewed  CBC WITH DIFFERENTIAL/PLATELET -  Abnormal; Notable for the following components:      Result Value   RBC 3.62 (*)    Hemoglobin 10.5 (*)    HCT 30.4 (*)    Platelets 625 (*)    nRBC 0.7 (*)    All other components within normal limits  SARS CORONAVIRUS 2 (TAT 6-24 HRS)  URINE CULTURE  COMPREHENSIVE METABOLIC PANEL  I-STAT BETA HCG BLOOD, ED ([facility], [facility], [facility] ONLY)    EKG None  Radiology CT Renal Stone Study  Result Date: 01/01/2020 CLINICAL DATA:  Flank pain. Sickle cell disease. EXAM: CT ABDOMEN AND PELVIS WITHOUT CONTRAST TECHNIQUE: Multidetector CT imaging of the abdomen and pelvis was performed following the standard protocol without IV contrast. COMPARISON:  None. FINDINGS: Lower chest: There is minimal atelectasis or scarring at the lung bases. Heart size is normal. Hepatobiliary: No focal liver abnormality is seen. Status post cholecystectomy. No biliary dilatation. Pancreas: Unremarkable. No pancreatic ductal dilatation or surrounding inflammatory changes. Spleen: Removed. Adrenals/Urinary Tract: Adrenal glands are unremarkable. Kidneys are normal, without renal calculi, focal lesion, or hydronephrosis. Bladder is unremarkable. Tiny calcification in the right side of the pelvis is likely a phlebolith. Stomach/Bowel: Stomach is within normal limits. Appendix appears normal. No evidence of bowel wall thickening, distention, or inflammatory changes. Vascular/Lymphatic: No significant vascular findings are present. No enlarged abdominal or pelvic lymph nodes. Reproductive: The uterus is normal. There are low-density lesions in both adnexal regions, 4.5 mm on the right and 5.3 mm on the left as well as a small amount of free fluid in the pelvis. Other: No abdominal wall hernia or abnormality. Musculoskeletal: Diffuse sclerosis of the bones, most prominent in the proximal femurs and in the pelvic bones consistent with the  patient's history of sickle cell disease. IMPRESSION: 1. No acute abnormality of the abdomen or pelvis. 2. Small  low-density lesions in both adnexal regions, probably representing cysts. 3. Diffuse sclerosis of the bones consistent with the patient's history of sickle cell disease. Electronically Signed   By: Lorriane Shire M.D.   On: 01/01/2020 13:38    Procedures Procedures (including critical care time)  Medications Ordered in ED Medications  0.45 % sodium chloride infusion ( Intravenous Not Given 01/01/20 1238)  cefTRIAXone (ROCEPHIN) 1 g in sodium chloride 0.9 % 100 mL IVPB (has no administration in time range)  HYDROmorphone (DILAUDID) injection 1 mg (1 mg Subcutaneous Given 01/01/20 1251)  ketorolac (TORADOL) 30 MG/ML injection 30 mg (30 mg Intramuscular Given 01/01/20 1251)  HYDROmorphone (DILAUDID) injection 1 mg (1 mg Subcutaneous Given 01/01/20 1403)    ED Course  I have reviewed the triage vital signs and the nursing notes.  Pertinent labs & imaging results that were available during my care of the patient were reviewed by me and considered in my medical decision making (see chart for details).    MDM Rules/Calculators/A&P                      Patient returns to the ED for evaluation of sickle cell pain crisis.  Reports pain is consistent with usual sickle cell pain crises.  She is afebrile, initially mildly tachycardic with resolution on subsequent reevaluations.  She has diffuse midline spine and paraspinal muscle tenderness with no focal tenderness.  No red flag signs concerning for cauda equina or spinal abscess.  She is neurovascularly intact.  Seen in the ED yesterday and sent to the sickle cell pain clinic where she had improvement in her pain but returns with worsening pain.  Also possibly had a UTI yesterday but did not pick up her medications.  Urine culture shows multiple species, will attempt to recollect culture.  Due to her flank pain and urinary symptoms a CT renal stone study was obtained which shows no evidence of nephrolithiasis or pyelonephritis.  Lab work reviewed and interpreted  by myself shows no leukocytosis, stable anemia, no metabolic derangements, chronic thrombocytosis.  No evidence of acute chest syndrome, aplastic crisis, or splenic sequestration.  She received multiple doses of subcutaneous and intramuscular pain medicines with no relief in her pain.  On reevaluation reports that her pain is still 10/10 in severity.  We discussed that since she has received multiple doses in the ED and was seen yesterday to consider admission to the hospital for further evaluation and management.  She is agreeable to this.   Spoke with [place] Hollis with sickle cell service who agrees to assume care of patient and bring her into the hospital for further evaluation and management.  Final Clinical Impression(s) / ED Diagnoses Final diagnoses:  Sickle cell pain crisis ([place])  Acute cystitis without hematuria    Rx / DC Orders ED Discharge Orders    None       Debroah Baller 01/01/20 1648    Truddie Hidden, MD 01/02/20 1009

## 2020-01-02 ENCOUNTER — Encounter (HOSPITAL_COMMUNITY): Payer: Self-pay | Admitting: Internal Medicine

## 2020-01-02 LAB — CBC
HCT: 25.1 % — ABNORMAL LOW (ref 36.0–46.0)
Hemoglobin: 8.9 g/dL — ABNORMAL LOW (ref 12.0–15.0)
MCH: 29.3 pg (ref 26.0–34.0)
MCHC: 35.5 g/dL (ref 30.0–36.0)
MCV: 82.6 fL (ref 80.0–100.0)
Platelets: 516 10*3/uL — ABNORMAL HIGH (ref 150–400)
RBC: 3.04 MIL/uL — ABNORMAL LOW (ref 3.87–5.11)
RDW: 14.3 % (ref 11.5–15.5)
WBC: 9.1 10*3/uL (ref 4.0–10.5)
nRBC: 0.7 % — ABNORMAL HIGH (ref 0.0–0.2)

## 2020-01-02 MED ORDER — METRONIDAZOLE 500 MG PO TABS
500.0000 mg | ORAL_TABLET | Freq: Two times a day (BID) | ORAL | Status: DC
  Administered 2020-01-02 – 2020-01-03 (×2): 500 mg via ORAL
  Filled 2020-01-02 (×3): qty 1

## 2020-01-02 MED ORDER — HYDROMORPHONE 1 MG/ML IV SOLN
INTRAVENOUS | Status: DC
  Administered 2020-01-02: 30 mg via INTRAVENOUS
  Administered 2020-01-02 (×2): 4.8 mg via INTRAVENOUS
  Administered 2020-01-03: 6.6 mg via INTRAVENOUS
  Administered 2020-01-03: 30 mg via INTRAVENOUS
  Administered 2020-01-03: 6.5 mg via INTRAVENOUS
  Filled 2020-01-02 (×2): qty 30

## 2020-01-02 MED ORDER — PROMETHAZINE HCL 25 MG/ML IJ SOLN
12.5000 mg | INTRAMUSCULAR | Status: DC | PRN
  Administered 2020-01-02 – 2020-01-03 (×3): 12.5 mg via INTRAVENOUS
  Filled 2020-01-02 (×3): qty 1

## 2020-01-02 NOTE — Progress Notes (Signed)
Subjective: Carmen [name], a 29 year old female with a medical history significant for sickle cell disease, chronic pain syndrome, opiate dependence and tolerance, and history of anemia of chronic disease was admitted for sickle cell pain crisis.  Patient continues to complain of low back pain.  Pain intensity is 8/10 and is characterized as constant and throbbing. Patient also endorses dysuria and frequency.  She denies headache, chest pain, shortness of breath, paresthesias, dizziness, constipation, or diarrhea.  Patient endorses nausea uncontrolled by IV Zofran.  Objective:  Vital signs in last 24 hours:  Vitals:   01/02/20 0544 01/02/20 0629 01/02/20 0830 01/02/20 0959  BP:  125/71  136/72  Pulse:  (!) 51  (!) 56  Resp: 16 15 14 16   Temp:  98.3 F (36.8 C)  98 F (36.7 C)  TempSrc:  Oral  Oral  SpO2: 99% 100% 100% 99%  Weight:      Height:        Intake/Output from previous day:   Intake/Output Summary (Last 24 hours) at 01/02/2020 1103 Last data filed at 01/02/2020 0859 Gross per 24 hour  Intake 1777.12 ml  Output 800 ml  Net 977.12 ml    Physical Exam: General: Alert, awake, oriented x3, in no acute distress.  HEENT: [place]/AT PEERL, EOMI Neck: Trachea midline,  no masses, no thyromegal,y no JVD, no carotid bruit OROPHARYNX:  Moist, No exudate/ erythema/lesions.  Heart: Regular rate and rhythm, without murmurs, rubs, gallops, PMI non-displaced, no heaves or thrills on palpation.  Lungs: Clear to auscultation, no wheezing or rhonchi noted. No increased vocal fremitus resonant to percussion  Abdomen: Soft, nontender, nondistended, positive bowel sounds, no masses no hepatosplenomegaly noted..  Neuro: No focal neurological deficits noted cranial nerves II through XII grossly intact. DTRs 2+ bilaterally upper and lower extremities. Strength 5 out of 5 in bilateral upper and lower extremities. Musculoskeletal: No warm swelling or erythema around joints, no spinal tenderness  noted. Psychiatric: Patient alert and oriented x3, good insight and cognition, good recent to remote recall. Lymph node survey: No cervical axillary or inguinal lymphadenopathy noted.  Lab Results:  Basic Metabolic Panel:    Component Value Date/Time   NA 139 01/01/2020 1142   NA 138 03/15/2019 1146   K 4.1 01/01/2020 1142   CL 107 01/01/2020 1142   CO2 22 01/01/2020 1142   BUN 6 01/01/2020 1142   BUN 12 03/15/2019 1146   CREATININE 0.54 01/01/2020 1142   GLUCOSE 90 01/01/2020 1142   CALCIUM 9.6 01/01/2020 1142   CBC:    Component Value Date/Time   WBC 9.1 01/02/2020 0830   HGB 8.9 (L) 01/02/2020 0830   HGB 9.8 (L) 06/15/2019 0856   HCT 25.1 (L) 01/02/2020 0830   HCT 29.5 (L) 06/15/2019 0856   PLT 516 (H) 01/02/2020 0830   PLT 425 06/15/2019 0856   MCV 82.6 01/02/2020 0830   MCV 86 06/15/2019 0856   NEUTROABS 7.0 01/01/2020 1142   NEUTROABS 3.2 06/15/2019 0856   LYMPHSABS 1.9 01/01/2020 1142   LYMPHSABS 3.3 (H) 06/15/2019 0856   MONOABS 0.9 01/01/2020 1142   EOSABS 0.2 01/01/2020 1142   EOSABS 0.3 06/15/2019 0856   BASOSABS 0.0 01/01/2020 1142   BASOSABS 0.1 06/15/2019 0856    Recent Results (from the past 240 hour(s))  Urine Culture     Status: Abnormal   Collection Time: 12/31/19 12:13 PM   Specimen: Urine, Random  Result Value Ref Range Status   Specimen Description   Final  URINE, RANDOM Performed at [facility], [place] [address] [place], [place] [address]    Special Requests NONE  Final   Culture MULTIPLE SPECIES PRESENT, SUGGEST RECOLLECTION (A)  Final   Report Status 01/01/2020 FINAL  Final  SARS CORONAVIRUS 2 (TAT 6-24 HRS) Nasopharyngeal Nasopharyngeal Swab     Status: None   Collection Time: 01/01/20  4:25 PM   Specimen: Nasopharyngeal Swab  Result Value Ref Range Status   SARS Coronavirus 2 NEGATIVE NEGATIVE Final    Comment: (NOTE) SARS-CoV-2 target nucleic acids are NOT DETECTED. The SARS-CoV-2 RNA is generally detectable  in upper and lower respiratory specimens during the acute phase of infection. Negative results do not preclude SARS-CoV-2 infection, do not rule out co-infections with other pathogens, and should not be used as the sole basis for treatment or other patient management decisions. Negative results must be combined with clinical observations, patient history, and epidemiological information. The expected result is Negative. Fact Sheet for Patients: SugarRoll.be Fact Sheet for Healthcare Providers: https://www.woods-mathews.com/ This test is not yet approved or cleared by the [place] FDA and  has been authorized for detection and/or diagnosis of SARS-CoV-2 by FDA under an Emergency Use Authorization (EUA). This EUA will remain  in effect (meaning this test can be used) for the duration of the COVID-19 declaration under Section 56 4(b)(1) of the Act, 21 U.S.C. section 360bbb-3(b)(1), unless the authorization is terminated or revoked sooner. Performed at [facility], [place] [address] [place],  [address]     Studies/Results: CT Renal Stone Study  Result Date: 01/01/2020 CLINICAL DATA:  Flank pain. Sickle cell disease. EXAM: CT ABDOMEN AND PELVIS WITHOUT CONTRAST TECHNIQUE: Multidetector CT imaging of the abdomen and pelvis was performed following the standard protocol without IV contrast. COMPARISON:  None. FINDINGS: Lower chest: There is minimal atelectasis or scarring at the lung bases. Heart size is normal. Hepatobiliary: No focal liver abnormality is seen. Status post cholecystectomy. No biliary dilatation. Pancreas: Unremarkable. No pancreatic ductal dilatation or surrounding inflammatory changes. Spleen: Removed. Adrenals/Urinary Tract: Adrenal glands are unremarkable. Kidneys are normal, without renal calculi, focal lesion, or hydronephrosis. Bladder is unremarkable. Tiny calcification in the right side of the pelvis is likely a  phlebolith. Stomach/Bowel: Stomach is within normal limits. Appendix appears normal. No evidence of bowel wall thickening, distention, or inflammatory changes. Vascular/Lymphatic: No significant vascular findings are present. No enlarged abdominal or pelvic lymph nodes. Reproductive: The uterus is normal. There are low-density lesions in both adnexal regions, 4.5 mm on the right and 5.3 mm on the left as well as a small amount of free fluid in the pelvis. Other: No abdominal wall hernia or abnormality. Musculoskeletal: Diffuse sclerosis of the bones, most prominent in the proximal femurs and in the pelvic bones consistent with the patient's history of sickle cell disease. IMPRESSION: 1. No acute abnormality of the abdomen or pelvis. 2. Small low-density lesions in both adnexal regions, probably representing cysts. 3. Diffuse sclerosis of the bones consistent with the patient's history of sickle cell disease. Electronically Signed   By: Lorriane Shire M.D.   On: 01/01/2020 13:38    Medications: Scheduled Meds: . aspirin EC  81 mg Oral Daily  . enoxaparin (LOVENOX) injection  40 mg Subcutaneous Q24H  . folic acid  1 mg Oral Daily  . HYDROmorphone   Intravenous Q4H  . ketorolac  15 mg Intravenous Q6H  . morphine  60 mg Oral Q12H  . senna-docusate  1 tablet Oral BID  Continuous Infusions: . sodium chloride 50 mL/hr at 01/02/20 0647  . cefTRIAXone (ROCEPHIN)  IV     PRN Meds:.diphenhydrAMINE, naloxone **AND** sodium chloride flush, polyethylene glycol, promethazine  Consultants:  None  Procedures:  None  Antibiotics:  IV Rocephin  Assessment/Plan: Principal Problem:   Sickle cell pain crisis (HCC) Active Problems:   Chronic prescription opiate use   Dysuria  Sickle cell disease with pain crisis: Reduce IV fluids, 0.45% saline at 50 mL/h IV Dilaudid PCA, settings decreased to 0.5 mg, 10-minute lockout, and 3 mg/h. Toradol 15 mg IV every 6 hours for a total of 5 days. Reevaluate  pain scale regularly, monitor vital signs closely, supplemental oxygen as needed.  Sickle cell anemia: Hemoglobin is 8.9 on today, which is decreased from baseline 10-11 g/dL.  Suspected to be hemodilution, IV fluids decreased.  Repeat CBC in a.m.  Probable urinary tract infection: Continue IV Rocephin, patient afebrile, follow urine culture, and follow vaginal swab  Chronic pain syndrome: Continue home medications   Code Status: Full Code Family Communication: N/A Disposition Plan: Not yet ready for discharge  [place], MSN, FNP-C Patient [place], [place] [address] [phone]  If 5PM-8AM, please contact night-coverage.  01/02/2020, 11:03 AM  LOS: 1 day

## 2020-01-03 ENCOUNTER — Other Ambulatory Visit: Payer: Self-pay | Admitting: Nurse Practitioner

## 2020-01-03 ENCOUNTER — Encounter: Payer: Self-pay | Admitting: Family Medicine

## 2020-01-03 ENCOUNTER — Telehealth: Payer: Self-pay | Admitting: Family Medicine

## 2020-01-03 DIAGNOSIS — Z79891 Long term (current) use of opiate analgesic: Secondary | ICD-10-CM

## 2020-01-03 DIAGNOSIS — D571 Sickle-cell disease without crisis: Secondary | ICD-10-CM

## 2020-01-03 LAB — NUSWAB VAGINITIS PLUS (VG+)
Atopobium vaginae: HIGH Score — AB
BVAB 2: HIGH Score — AB
Candida albicans, NAA: NEGATIVE
Candida glabrata, NAA: NEGATIVE
Chlamydia trachomatis, NAA: NEGATIVE
Megasphaera 1: HIGH Score — AB
Neisseria gonorrhoeae, NAA: NEGATIVE
Trich vag by NAA: NEGATIVE

## 2020-01-03 MED ORDER — OXYCODONE HCL 10 MG PO TABS: 10.0000 mg | ORAL_TABLET | Freq: Four times a day (QID) | ORAL | 0 refills | Status: DC | PRN

## 2020-01-03 MED ORDER — MORPHINE SULFATE ER 60 MG PO TBCR: 60.0000 mg | EXTENDED_RELEASE_TABLET | Freq: Two times a day (BID) | ORAL | 0 refills | Status: DC

## 2020-01-03 NOTE — Discharge Instructions (Signed)
Sickle Cell Anemia, Adult  Sickle cell anemia is a condition where your red blood cells are shaped like sickles. Red blood cells carry oxygen through the body. Sickle-shaped cells do not live as long as normal red blood cells. They also clump together and block blood from flowing through the blood vessels. This prevents the body from getting enough oxygen. Sickle cell anemia causes organ damage and pain. It also increases the risk of infection. Follow these instructions at home: Medicines  Take over-the-counter and prescription medicines only as told by your doctor.  If you were prescribed an antibiotic medicine, take it as told by your doctor. Do not stop taking the antibiotic even if you start to feel better.  If you develop a fever, do not take medicines to lower the fever right away. Tell your doctor about the fever. Managing pain, stiffness, and swelling  Try these methods to help with pain: ? Use a heating pad. ? Take a warm bath. ? Distract yourself, such as by watching TV. Eating and drinking  Drink enough fluid to keep your pee (urine) clear or pale yellow. Drink more in hot weather and during exercise.  Limit or avoid alcohol.  Eat a healthy diet. Eat plenty of fruits, vegetables, whole grains, and lean protein.  Take vitamins and supplements as told by your doctor. Traveling  When traveling, keep these with you: ? Your medical information. ? The names of your doctors. ? Your medicines.  If you need to take an airplane, talk to your doctor first. Activity  Rest often.  Avoid exercises that make your heart beat much faster, such as jogging. General instructions  Do not use products that have nicotine or tobacco, such as cigarettes and e-cigarettes. If you need help quitting, ask your doctor.  Consider wearing a medical alert bracelet.  Avoid being in high places (high altitudes), such as mountains.  Avoid very hot or cold temperatures.  Avoid places where the  temperature changes a lot.  Keep all follow-up visits as told by your doctor. This is important. Contact a doctor if:  A joint hurts.  Your feet or hands hurt or swell.  You feel tired (fatigued). Get help right away if:  You have symptoms of infection. These include: ? Fever. ? Chills. ? Being very tired. ? Irritability. ? Poor eating. ? Throwing up (vomiting).  You feel dizzy or faint.  You have new stomach pain, especially on the left side.  You have a an erection (priapism) that lasts more than 4 hours.  You have numbness in your arms or legs.  You have a hard time moving your arms or legs.  You have trouble talking.  You have pain that does not go away when you take medicine.  You are short of breath.  You are breathing fast.  You have a long-term cough.  You have pain in your chest.  You have a bad headache.  You have a stiff neck.  Your stomach looks bloated even though you did not eat much.  Your skin is pale.  You suddenly cannot see well. Summary  Sickle cell anemia is a condition where your red blood cells are shaped like sickles.  Follow your doctor's advice on ways to manage pain, food to eat, activities to do, and steps to take for safe travel.  Get medical help right away if you have any signs of infection, such as a fever. This information is not intended to replace advice given to you by   your health care provider. Make sure you discuss any questions you have with your health care provider. Document Revised: 02/09/2019 Document Reviewed: 11/23/2016 Elsevier Patient Education  2020 [employer] Inc.  

## 2020-01-03 NOTE — Discharge Summary (Addendum)
Physician Discharge Summary  Carmen [name] [id number] DOB: Apr 04, 1991 DOA: 01/01/2020  PCP: Vevelyn Francois, NP  Admit date: 01/01/2020  Discharge date: 01/03/2020  Discharge Diagnoses:  Principal Problem:   Sickle cell pain crisis [facility]) Active Problems:   Chronic prescription opiate use   Dysuria   Discharge Condition: Stable  Disposition:  Follow-up Information    Vevelyn Francois, NP Follow up.   Specialty: Adult Health Nurse Practitioner Why: Follow up for labs in 1 week repeat CBC and CMP Contact information: [address] [place] [id number] [phone]          Pt is discharged home in good condition and is to follow up with Vevelyn Francois, NP this week to have labs evaluated. Carmen [name] is instructed to increase activity slowly and balance with rest for the next few days, and use prescribed medication to complete treatment of pain  Diet: Regular Wt Readings from Last 3 Encounters:  01/02/20 74.7 kg  12/31/19 74.8 kg  12/06/19 74.8 kg    History of present illness:  Carmen [name] is a 29 year old female with a medical history significant for sickle cell disease, chronic pain syndrome, opiate dependence and tolerance, and history of anemia of chronic disease presented to emergency department complaining of generalized pain that is consistent with pain crisis.  Patient says that the pain intensity has been elevated over the past week and has been unrelieved by home medications.  She was treated and evaluated in the emergency department on 12/31/2019 and transitioned to sickle cell day infusion center for pain management and extended observation.  Patient says at discharge pain was well controlled.  She awakened this a.m. with sharp, consistent pain primarily to low back and lower extremities.  Patient also endorses pain with urination.  She was started on Macrobid on yesterday, but has not started medication.  Patient denies headache, chest pain, shortness of  breath, nausea, vomiting, or diarrhea.  No sick contacts, recent travel, or exposure to COVID-19.  ER course: Hemoglobin 10.5, consistent with patient's baseline.  All other laboratory values reassuring.  Vital signs mostly normal.  COVID-19 test pending.  Patient's pain persists despite several doses of IV Dilaudid, IV Toradol, and IV fluids.  Patient will transfer to [place] for management of sickle cell pain crisis.  Hospital Course:  Sickle cell disease with pain crisis: Patient was admitted for sickle cell pain crisis and managed appropriately with IVF, IV Dilaudid via PCA and IV Toradol, as well as other adjunct therapies per sickle cell pain management protocols.  IV Dilaudid was weaned appropriately.  MS Contin continued without interruption.  Patient tolerating oxycodone for pain control.  Advised to resume all home medications.  Pain intensity 3/10.  Patient says that she can manage at home on current medication regimen. Reviewed labs, all reassuring.  Patient is afebrile and maintaining oxygen saturation at 100% on RA.  She is aware of all upcoming appointments.  Advised to follow-up with PCP in 2 weeks.  Patient advised to follow-up in 1 week to repeat CBC.  Hemoglobin 8.9, slightly decreased from patient's baseline.  Patient complained of dysuria.  She received several doses of IV Rocephin.  Reviewed vaginal swab, positive for bacterial vaginosis.  Patient will continue metronidazole 500 mg twice daily for a total of 7 days.  Patient alert, oriented, and ambulating without assistance.  Patient was discharged home today in a hemodynamically stable condition.   Discharge Exam: Vitals:   01/03/20  1000 01/03/20 1146  BP: 126/83   Pulse: 64   Resp: 17 (!) 9  Temp: 98.4 F (36.9 C)   SpO2: 100% 98%   Vitals:   01/03/20 0500 01/03/20 0843 01/03/20 1000 01/03/20 1146  BP: 122/72  126/83   Pulse: 64  64   Resp: 16 16 17  (!) 9  Temp: 98.6 F (37 C)  98.4 F (36.9 C)    TempSrc: Oral  Oral   SpO2: 100% 100% 100% 98%  Weight:      Height:        General appearance : Awake, alert, not in any distress. Speech Clear. Not toxic looking HEENT: Atraumatic and Normocephalic, pupils equally reactive to light and accomodation Neck: Supple, no JVD. No cervical lymphadenopathy.  Chest: Good air entry bilaterally, no added sounds  CVS: S1 S2 regular, no murmurs.  Abdomen: Bowel sounds present, Non tender and not distended with no gaurding, rigidity or rebound. Extremities: B/L Lower Ext shows no edema, both legs are warm to touch Neurology: Awake alert, and oriented X 3, CN II-XII intact, Non focal Skin: No Rash  Discharge Instructions  Discharge Instructions    Discharge patient   Complete by: As directed    Discharge disposition: 01-Home or Self Care   Discharge patient date: 01/03/2020     Allergies as of 01/03/2020   No Known Allergies     Medication List    TAKE these medications   acetaminophen 500 MG tablet Commonly known as: TYLENOL Take 2 tablets (1,000 mg total) by mouth 2 (two) times daily as needed.   aspirin EC 81 MG tablet Take 1 tablet (81 mg total) by mouth daily.   cyclobenzaprine 10 MG tablet Commonly known as: FLEXERIL Take 1 tablet (10 mg total) by mouth 2 (two) times daily.   folic acid 1 MG tablet Commonly known as: FOLVITE Take 1 tablet (1 mg total) by mouth daily.   ibuprofen 800 MG tablet Commonly known as: ADVIL Take 1 tablet (800 mg total) by mouth 3 (three) times daily with meals as needed.   morphine 60 MG 12 hr tablet Commonly known as: MS Contin Take 1 tablet (60 mg total) by mouth every 12 (twelve) hours.   nitrofurantoin (macrocrystal-monohydrate) 100 MG capsule Commonly known as: Macrobid Take 1 capsule (100 mg total) by mouth 2 (two) times daily for 7 days.   Oxycodone HCl 10 MG Tabs Take 1 tablet (10 mg total) by mouth every 6 (six) hours as needed (PAIN).   PrePLUS 27-1 MG Tabs Take 1 tablet by  mouth daily.   Vitamin D (Ergocalciferol) 1.25 MG (50000 UNIT) Caps capsule Commonly known as: DRISDOL TAKE ONE CAPSULE BY MOUTH ONE TIME PER WEEK What changed: See the new instructions.       The results of significant diagnostics from this hospitalization (including imaging, microbiology, ancillary and laboratory) are listed below for reference.    Significant Diagnostic Studies: CT Renal Stone Study  Result Date: 01/01/2020 CLINICAL DATA:  Flank pain. Sickle cell disease. EXAM: CT ABDOMEN AND PELVIS WITHOUT CONTRAST TECHNIQUE: Multidetector CT imaging of the abdomen and pelvis was performed following the standard protocol without IV contrast. COMPARISON:  None. FINDINGS: Lower chest: There is minimal atelectasis or scarring at the lung bases. Heart size is normal. Hepatobiliary: No focal liver abnormality is seen. Status post cholecystectomy. No biliary dilatation. Pancreas: Unremarkable. No pancreatic ductal dilatation or surrounding inflammatory changes. Spleen: Removed. Adrenals/Urinary Tract: Adrenal glands are unremarkable. Kidneys are normal, without renal  calculi, focal lesion, or hydronephrosis. Bladder is unremarkable. Tiny calcification in the right side of the pelvis is likely a phlebolith. Stomach/Bowel: Stomach is within normal limits. Appendix appears normal. No evidence of bowel wall thickening, distention, or inflammatory changes. Vascular/Lymphatic: No significant vascular findings are present. No enlarged abdominal or pelvic lymph nodes. Reproductive: The uterus is normal. There are low-density lesions in both adnexal regions, 4.5 mm on the right and 5.3 mm on the left as well as a small amount of free fluid in the pelvis. Other: No abdominal wall hernia or abnormality. Musculoskeletal: Diffuse sclerosis of the bones, most prominent in the proximal femurs and in the pelvic bones consistent with the patient's history of sickle cell disease. IMPRESSION: 1. No acute abnormality of the  abdomen or pelvis. 2. Small low-density lesions in both adnexal regions, probably representing cysts. 3. Diffuse sclerosis of the bones consistent with the patient's history of sickle cell disease. Electronically Signed   By: Lorriane Shire M.D.   On: 01/01/2020 13:38    Microbiology: Recent Results (from the past 240 hour(s))  Urine Culture     Status: Abnormal   Collection Time: 12/31/19 12:13 PM   Specimen: Urine, Random  Result Value Ref Range Status   Specimen Description   Final    URINE, RANDOM Performed at [facility] [address] [place], [place] [address]    Special Requests NONE  Final   Culture MULTIPLE SPECIES PRESENT, SUGGEST RECOLLECTION (A)  Final   Report Status 01/01/2020 FINAL  Final  SARS CORONAVIRUS 2 (TAT 6-24 HRS) Nasopharyngeal Nasopharyngeal Swab     Status: None   Collection Time: 01/01/20  4:25 PM   Specimen: Nasopharyngeal Swab  Result Value Ref Range Status   SARS Coronavirus 2 NEGATIVE NEGATIVE Final    Comment: (NOTE) SARS-CoV-2 target nucleic acids are NOT DETECTED. The SARS-CoV-2 RNA is generally detectable in upper and lower respiratory specimens during the acute phase of infection. Negative results do not preclude SARS-CoV-2 infection, do not rule out co-infections with other pathogens, and should not be used as the sole basis for treatment or other patient management decisions. Negative results must be combined with clinical observations, patient history, and epidemiological information. The expected result is Negative. Fact Sheet for Patients: SugarRoll.be Fact Sheet for Healthcare Providers: https://www.woods-mathews.com/ This test is not yet approved or cleared by the [place] FDA and  has been authorized for detection and/or diagnosis of SARS-CoV-2 by FDA under an Emergency Use Authorization (EUA). This EUA will remain  in effect (meaning this test can be used) for the  duration of the COVID-19 declaration under Section 56 4(b)(1) of the Act, 21 U.S.C. section 360bbb-3(b)(1), unless the authorization is terminated or revoked sooner. Performed at [facility], [place] [address] [place], [place] [address]      Labs: Basic Metabolic Panel: Recent Labs  Lab 12/31/19 0900 01/01/20 1142  NA 139 139  K 4.2 4.1  CL 107 107  CO2 25 22  GLUCOSE 98 90  BUN 8 6  CREATININE 0.49 0.54  CALCIUM 9.3 9.6   Liver Function Tests: Recent Labs  Lab 01/01/20 1142  AST 24  ALT 29  ALKPHOS 70  BILITOT 1.1  PROT 7.5  ALBUMIN 4.0   No results for input(s): LIPASE, AMYLASE in the last 168 hours. No results for input(s): AMMONIA in the last 168 hours. CBC: Recent Labs  Lab 12/31/19 0900 01/01/20 1142 01/02/20 0830  WBC 8.5 10.0 9.1  NEUTROABS 3.7 7.0  --   HGB 10.5* 10.5* 8.9*  HCT 30.0* 30.4* 25.1*  MCV 84.0 84.0 82.6  PLT 563* 625* 516*   Cardiac Enzymes: No results for input(s): CKTOTAL, CKMB, CKMBINDEX, TROPONINI in the last 168 hours. BNP: Invalid input(s): POCBNP CBG: No results for input(s): GLUCAP in the last 168 hours.  Time coordinating discharge: 50 minutes  Signed:  [name]  APRN, MSN, FNP-C [facility] [place], [place] [address] [phone]  [facility] 01/03/2020, 1:16 PM

## 2020-01-03 NOTE — Progress Notes (Signed)
Medication refill for maintenance opioids oxycodone 10 mg and morphine sulfate ER 60 mg

## 2020-01-03 NOTE — Telephone Encounter (Signed)
Pt called needing a doctor's note due to being absent from Monday until Today (3/4).

## 2020-01-03 NOTE — Progress Notes (Signed)
Patient is discharge. Verbally state understanding DC"D order. AOX4 ambulatory. Teaching provided and patients agree to adhere to teaching

## 2020-01-04 ENCOUNTER — Telehealth: Payer: Self-pay

## 2020-01-04 ENCOUNTER — Other Ambulatory Visit: Payer: Self-pay | Admitting: Family Medicine

## 2020-01-04 DIAGNOSIS — B9689 Other specified bacterial agents as the cause of diseases classified elsewhere: Secondary | ICD-10-CM

## 2020-01-04 DIAGNOSIS — N760 Acute vaginitis: Secondary | ICD-10-CM

## 2020-01-04 MED ORDER — METRONIDAZOLE 500 MG PO TABS: 500.0000 mg | ORAL_TABLET | Freq: Two times a day (BID) | ORAL | 0 refills | Status: DC

## 2020-01-04 NOTE — Telephone Encounter (Signed)
Called and spoke with patient advised that swab yielded bacterial vaginitis. Advised that provider has started metronidazole 500 mg twice daily for 7 days, Asked that she refrain from drinking alcohol while taking medication. Patient verbalized understanding. Thanks!

## 2020-01-04 NOTE — Telephone Encounter (Signed)
-----   Message from [name], [place] sent at 01/04/2020  4:00 PM EST ----- Regarding: lab results and medication Bacterial swab yielded bacterial vaginitis.  Will start Metronidazole 500 mg BID for 7 days. Refrain from alcohol while taking metronidazole.     [name]  APRN, MSN, FNP-C [facility] [address] [place], [place] [address] [phone]

## 2020-01-04 NOTE — Progress Notes (Signed)
Meds ordered this encounter  Medications  . metroNIDAZOLE (FLAGYL) 500 MG tablet    Sig: Take 1 tablet (500 mg total) by mouth 2 (two) times daily.    Dispense:  14 tablet    Refill:  0    Order Specific Question:   Supervising Provider    Answer:   Tresa Garter [id number]    [name]  APRN, MSN, FNP-C [facility] [address] [place], [place] [address] [phone]

## 2020-01-17 ENCOUNTER — Other Ambulatory Visit: Payer: Self-pay | Admitting: Nurse Practitioner

## 2020-01-17 ENCOUNTER — Telehealth: Payer: Self-pay | Admitting: Nurse Practitioner

## 2020-01-17 DIAGNOSIS — D571 Sickle-cell disease without crisis: Secondary | ICD-10-CM

## 2020-01-17 DIAGNOSIS — Z79891 Long term (current) use of opiate analgesic: Secondary | ICD-10-CM

## 2020-01-17 MED ORDER — OXYCODONE HCL 10 MG PO TABS: 10.0000 mg | ORAL_TABLET | Freq: Four times a day (QID) | ORAL | 0 refills | Status: DC | PRN

## 2020-01-17 NOTE — Telephone Encounter (Signed)
Pt called in refill on oxycodone 10mg 

## 2020-01-17 NOTE — Telephone Encounter (Signed)
Sent!

## 2020-01-29 ENCOUNTER — Telehealth: Payer: Self-pay | Admitting: Family Medicine

## 2020-01-31 ENCOUNTER — Other Ambulatory Visit: Payer: Self-pay | Admitting: Nurse Practitioner

## 2020-01-31 DIAGNOSIS — Z79891 Long term (current) use of opiate analgesic: Secondary | ICD-10-CM

## 2020-01-31 DIAGNOSIS — D571 Sickle-cell disease without crisis: Secondary | ICD-10-CM

## 2020-01-31 MED ORDER — MORPHINE SULFATE ER 60 MG PO TBCR: 60.0000 mg | EXTENDED_RELEASE_TABLET | Freq: Two times a day (BID) | ORAL | 0 refills | Status: DC

## 2020-02-01 ENCOUNTER — Ambulatory Visit: Payer: Self-pay | Admitting: Nurse Practitioner

## 2020-02-02 ENCOUNTER — Emergency Department (HOSPITAL_COMMUNITY)
Admission: EM | Admit: 2020-02-02 | Discharge: 2020-02-02 | Disposition: A | Discharge status: Home / Self Care | Payer: Medicaid Other | Attending: Emergency Medicine | Admitting: Emergency Medicine

## 2020-02-02 ENCOUNTER — Other Ambulatory Visit: Payer: Self-pay

## 2020-02-02 DIAGNOSIS — F17210 Nicotine dependence, cigarettes, uncomplicated: Secondary | ICD-10-CM | POA: Diagnosis not present

## 2020-02-02 DIAGNOSIS — Z79899 Other long term (current) drug therapy: Secondary | ICD-10-CM | POA: Diagnosis not present

## 2020-02-02 DIAGNOSIS — Z7982 Long term (current) use of aspirin: Secondary | ICD-10-CM | POA: Insufficient documentation

## 2020-02-02 DIAGNOSIS — D5700 Hb-SS disease with crisis, unspecified: Secondary | ICD-10-CM | POA: Insufficient documentation

## 2020-02-02 LAB — CBC WITH DIFFERENTIAL/PLATELET
Abs Immature Granulocytes: 0.05 10*3/uL (ref 0.00–0.07)
Basophils Absolute: 0.1 10*3/uL (ref 0.0–0.1)
Basophils Relative: 1 %
Eosinophils Absolute: 0.2 10*3/uL (ref 0.0–0.5)
Eosinophils Relative: 3 %
HCT: 32.5 % — ABNORMAL LOW (ref 36.0–46.0)
Hemoglobin: 11.5 g/dL — ABNORMAL LOW (ref 12.0–15.0)
Immature Granulocytes: 1 %
Lymphocytes Relative: 35 %
Lymphs Abs: 2.5 10*3/uL (ref 0.7–4.0)
MCH: 29.7 pg (ref 26.0–34.0)
MCHC: 35.4 g/dL (ref 30.0–36.0)
MCV: 84 fL (ref 80.0–100.0)
Monocytes Absolute: 0.8 10*3/uL (ref 0.1–1.0)
Monocytes Relative: 11 %
Neutro Abs: 3.6 10*3/uL (ref 1.7–7.7)
Neutrophils Relative %: 49 %
Platelets: 589 10*3/uL — ABNORMAL HIGH (ref 150–400)
RBC: 3.87 MIL/uL (ref 3.87–5.11)
RDW: 14.1 % (ref 11.5–15.5)
WBC: 7.2 10*3/uL (ref 4.0–10.5)
nRBC: 0.6 % — ABNORMAL HIGH (ref 0.0–0.2)

## 2020-02-02 LAB — BASIC METABOLIC PANEL
Anion gap: 9 (ref 5–15)
BUN: 9 mg/dL (ref 6–20)
CO2: 23 mmol/L (ref 22–32)
Calcium: 9.7 mg/dL (ref 8.9–10.3)
Chloride: 111 mmol/L (ref 98–111)
Creatinine, Ser: 0.44 mg/dL (ref 0.44–1.00)
GFR calc Af Amer: >60 mL/min (ref >60)
GFR calc non Af Amer: >60 mL/min (ref >60)
Glucose, Bld: 96 mg/dL (ref 70–99)
Potassium: 4.1 mmol/L (ref 3.5–5.1)
Sodium: 143 mmol/L (ref 135–145)

## 2020-02-02 LAB — RETICULOCYTES
Immature Retic Fract: 28.3 % — ABNORMAL HIGH (ref 2.3–15.9)
RBC.: 3.87 MIL/uL (ref 3.87–5.11)
Retic Count, Absolute: 190 10*3/uL — ABNORMAL HIGH (ref 19.0–186.0)
Retic Ct Pct: 4.9 % — ABNORMAL HIGH (ref 0.4–3.1)

## 2020-02-02 LAB — I-STAT BETA HCG BLOOD, ED (MC, WL, AP ONLY): I-stat hCG, quantitative: <5 m[IU]/mL (ref <5)

## 2020-02-02 MED ORDER — HYDROMORPHONE HCL 1 MG/ML IJ SOLN
1.0000 mg | Freq: Once | INTRAMUSCULAR | Status: AC
  Administered 2020-02-02: 1 mg via INTRAVENOUS
  Filled 2020-02-02: qty 1

## 2020-02-02 MED ORDER — DEXTROSE-NACL 5-0.45 % IV SOLN: INTRAVENOUS | Status: DC

## 2020-02-02 MED ORDER — HYDROMORPHONE HCL 1 MG/ML IJ SOLN
1.0000 mg | Freq: Once | INTRAMUSCULAR | Status: AC
  Administered 2020-02-02: 08:00:00 1 mg via INTRAVENOUS
  Filled 2020-02-02: qty 1

## 2020-02-02 NOTE — ED Provider Notes (Signed)
[place] DEPT Provider Note   CSN: [id number] Arrival date & time: 02/02/20  0701     History Chief Complaint  Patient presents with  . Sickle Cell Pain Crisis    Carmen [name] is a 29 y.o. female with a past medical history of sickle cell anemia presenting to the ED with a chief complaint of sickle cell pain crisis.  Starting yesterday had bilateral arm pain that has worsened after she went to sleep.  She reports aching pain bilateral arms, lower back which is typical of her pain crises.  She has taken her home morphine, oxycodone and muscle relaxer without significant improvement in her symptoms.  She denies any injuries or falls, vomiting, chest pain, fever, numbness in arms or legs, diarrhea, abdominal pain or possibility of pregnancy.  HPI     Past Medical History:  Diagnosis Date  . Headache(784.0)   . Sickle cell crisis ([place])   . Syphilis 2015   Was diagnosed and received one injection of antibiotics  . Thrombocytosis ([place]) 11/22/2014     CBC Latest Ref Rng & Units 12/13/2018 12/11/2018 12/10/2018 WBC 4.0 - 10.5 K/uL 14.8(H) 13.2(H) 15.9(H) Hemoglobin 12.0 - 15.0 g/dL 8.2(L) 7.7(L) 8.1(L) Hematocrit 36.0 - 46.0 % 23.7(L) 23.2(L) 24.5(L) Platelets 150 - 400 K/uL 326 399 388      Patient Active Problem List   Diagnosis Date Noted  . Bacterial vaginitis 01/04/2020  . Acute cystitis without hematuria   . Dysuria 12/31/2019  . Urine leukocytes   . Sickle cell pain crisis ([place]) 09/10/2019  . Sickle cell disease ([place]) 12/04/2018  . Alpha thalassemia silent carrier 12/04/2018  . Chronic prescription opiate use 03/13/2018  . Chronic musculoskeletal pain 03/13/2018  . Vitamin D deficiency 01/13/2018  . Leukocytosis 08/21/2017  . 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 Hb-SS disease without crisis ([place]) 08/15/2016  . Anemia of chronic disease   . Systolic ejection murmur [id number]  . Sickle cell anemia of mother during  pregnancy [facility]) 05/16/2014    Past Surgical History:  Procedure Laterality Date  . CESAREAN SECTION N/A 02/05/2019   Procedure: CESAREAN SECTION;  Surgeon: [name], MD;  Location: [facility] LD ORS;  Service: Obstetrics;  Laterality: N/A;  . CHOLECYSTECTOMY N/A 11/30/2014   Procedure: LAPAROSCOPIC CHOLECYSTECTOMY SINGLE SITE WITH INTRAOPERATIVE CHOLANGIOGRAM;  Surgeon: Michael Boston, MD;  Location: [facility] ORS;  Service: General;  Laterality: N/A;  . SPLENECTOMY       OB History    Gravida  2   Para  1   Term  1   Preterm      AB  1   Living  1     SAB      TAB  1   Ectopic      Multiple  0   Live Births  1           Family History  Problem Relation Age of Onset  . Hypertension Mother   . Sickle cell anemia Sister   . Kidney disease Sister        Lupus  . Arthritis Sister   . Sickle cell anemia Sister   . Sickle cell trait Sister   . Heart disease Maternal Aunt        CABG  . Heart disease Maternal Uncle        CABG  . Lupus Sister     Social History   Tobacco Use  . Smoking status: Current Some  Day Smoker    Types: Cigarettes  . Smokeless tobacco: Never Used  Substance Use Topics  . Alcohol use: No  . Drug use: No    Home Medications Prior to Admission medications   Medication Sig Start Date End Date Taking? Authorizing Provider  acetaminophen (TYLENOL) 500 MG tablet Take 2 tablets (1,000 mg total) by mouth 2 (two) times daily as needed. 12/04/19  Yes Azzie Glatter, FNP  aspirin EC 81 MG tablet Take 1 tablet (81 mg total) by mouth daily. 03/15/19  Yes Lanae Boast, FNP  cyclobenzaprine (FLEXERIL) 10 MG tablet Take 1 tablet (10 mg total) by mouth 2 (two) times daily. 12/04/19  Yes Azzie Glatter, FNP  folic acid (FOLVITE) 1 MG tablet Take 1 tablet (1 mg total) by mouth daily. 03/15/19  Yes Lanae Boast, FNP  morphine (MS CONTIN) 60 MG 12 hr tablet Take 1 tablet (60 mg total) by mouth every 12 (twelve) hours. 02/02/20 03/03/20 Yes Vevelyn Francois, NP   Oxycodone HCl 10 MG TABS Take 1 tablet (10 mg total) by mouth every 6 (six) hours as needed for up to 15 days (PAIN). 01/18/20 02/02/20 Yes Vevelyn Francois, NP  Prenatal Vit-Fe Fumarate-FA (PREPLUS) 27-1 MG TABS Take 1 tablet by mouth daily. 08/17/18  Yes [name], MD  Vitamin D, Ergocalciferol, (DRISDOL) 1.25 MG (50000 UT) CAPS capsule TAKE ONE CAPSULE BY MOUTH ONE TIME PER WEEK Patient taking differently: Take 50,000 Units by mouth every 7 (seven) days.  10/11/19  Yes Azzie Glatter, FNP  ibuprofen (ADVIL) 800 MG tablet Take 1 tablet (800 mg total) by mouth 3 (three) times daily with meals as needed. Patient not taking: Reported on 02/02/2020 03/15/19   Lanae Boast, FNP  metroNIDAZOLE (FLAGYL) 500 MG tablet Take 1 tablet (500 mg total) by mouth 2 (two) times daily. Patient not taking: Reported on 02/02/2020 01/04/20   [name], FNP    Allergies    Patient has no known allergies.  Review of Systems   Review of Systems  Constitutional: Negative for appetite change, chills and fever.  HENT: Negative for ear pain, rhinorrhea, sneezing and sore throat.   Eyes: Negative for photophobia and visual disturbance.  Respiratory: Negative for cough, chest tightness, shortness of breath and wheezing.   Cardiovascular: Negative for chest pain and palpitations.  Gastrointestinal: Negative for abdominal pain, blood in stool, constipation, diarrhea, nausea and vomiting.  Genitourinary: Negative for dysuria, hematuria and urgency.  Musculoskeletal: Positive for myalgias.  Skin: Negative for rash.  Neurological: Negative for dizziness, weakness and light-headedness.    Physical Exam Updated Vital Signs BP 121/74   Pulse 71   Temp 98.6 F (37 C) (Oral)   Resp (!) 23   Ht 5\' 4"  (1.626 m)   Wt 79.4 kg   LMP 01/09/2020   SpO2 97%   BMI 30.04 kg/m   Physical Exam Vitals and nursing note reviewed.  Constitutional:      General: She is not in acute distress.    Appearance: She is  well-developed.  HENT:     Head: Normocephalic and atraumatic.     Nose: Nose normal.  Eyes:     General: No scleral icterus.       Left eye: No discharge.     Conjunctiva/sclera: Conjunctivae normal.  Cardiovascular:     Rate and Rhythm: Normal rate and regular rhythm.     Heart sounds: Normal heart sounds. No murmur. No friction rub. No gallop.  Pulmonary:     Effort: Pulmonary effort is normal. No respiratory distress.     Breath sounds: Normal breath sounds.  Abdominal:     General: Bowel sounds are normal. There is no distension.     Palpations: Abdomen is soft.     Tenderness: There is no abdominal tenderness. There is no guarding.  Musculoskeletal:        General: Normal range of motion.     Cervical back: Normal range of motion and neck supple.  Skin:    General: Skin is warm and dry.     Findings: No rash.  Neurological:     Mental Status: She is alert.     Motor: No abnormal muscle tone.     Coordination: Coordination normal.     ED Results / Procedures / Treatments   Labs (all labs ordered are listed, but only abnormal results are displayed) Labs Reviewed  CBC WITH DIFFERENTIAL/PLATELET - Abnormal; Notable for the following components:      Result Value   Hemoglobin 11.5 (*)    HCT 32.5 (*)    Platelets 589 (*)    nRBC 0.6 (*)    All other components within normal limits  RETICULOCYTES - Abnormal; Notable for the following components:   Retic Ct Pct 4.9 (*)    Retic Count, Absolute 190.0 (*)    Immature Retic Fract 28.3 (*)    All other components within normal limits  BASIC METABOLIC PANEL  I-STAT BETA HCG BLOOD, ED ([facility], [facility], [facility] ONLY)    EKG None  Radiology No results found.  Procedures Procedures (including critical care time)  Medications Ordered in ED Medications  dextrose 5 %-0.45 % sodium chloride infusion ( Intravenous New Bag/Given 02/02/20 0755)  HYDROmorphone (DILAUDID) injection 1 mg (1 mg Intravenous Given 02/02/20 0816)    HYDROmorphone (DILAUDID) injection 1 mg (1 mg Intravenous Given 02/02/20 0931)  HYDROmorphone (DILAUDID) injection 1 mg (1 mg Intravenous Given 02/02/20 1030)    ED Course  I have reviewed the triage vital signs and the nursing notes.  Pertinent labs & imaging results that were available during my care of the patient were reviewed by me and considered in my medical decision making (see chart for details).    MDM Rules/Calculators/A&P                      29 year old female with a past medical history of sickle cell anemia presenting to the ED with a chief complaint of sickle cell pain crisis.  States that she is having pain in her bilateral arms and lower back since yesterday.  Pain worsened after she went to sleep.  The location of her pain is typical of her pain crises.  No improvement with her home oxycodone, morphine or muscle relaxer.  Denies any injuries or falls, chest pain, shortness of breath, fever, abdominal pain or vomiting.  On exam patient is well appearing. No deformities noted on exam.  Vital signs are within normal limits, she is not febrile or hypoxic.  Lab work showing hemoglobin at baseline.  hCG is negative.  BMP is unremarkable.  Reticulocyte count at baseline.  Patient was given pain medication here with significant improvement and resolution of her symptoms.  She is requesting discharge which I feel is reasonable as her symptoms are under control.  I have low suspicion for acute chest syndrome, cauda equina, pyelonephritis, or other emergent cause of her symptoms. Will have her continue home medications and return  for worsening symptoms.  Patient is hemodynamically stable, in NAD, and able to ambulate in the ED. Evaluation does not show pathology that would require ongoing emergent intervention or inpatient treatment. I have personally reviewed and interpreted all lab work and imaging at today's ED visit. I explained the diagnosis to the patient. Pain has been managed and has no  complaints prior to discharge. Patient is comfortable with above plan and is stable for discharge at this time. All questions were answered prior to disposition. Strict return precautions for returning to the ED were discussed. Encouraged follow up with PCP.   An After Visit Summary was printed and given to the patient.   Portions of this note were generated with [employer]. Dictation errors may occur despite best attempts at proofreading.  Final Clinical Impression(s) / ED Diagnoses Final diagnoses:  Sickle cell pain crisis [facility])    Rx / DC Orders ED Discharge Orders    None       Delia Heady, PA-C 02/02/20 1106    Blanchie Dessert, MD 02/02/20 1456

## 2020-02-02 NOTE — ED Triage Notes (Signed)
Patient states she is having sickle cell pain in bilateral arms and lower back. Pain started yesterday. Patient has taken oxycodone and muscle relaxer.

## 2020-02-02 NOTE — Discharge Instructions (Addendum)
Continue home medications as previously prescribed. Follow up with primary care provider. Return to the ED if you start to experience worsening pain, chest pain, shortness of breath, injuries or falls.

## 2020-02-04 ENCOUNTER — Ambulatory Visit: Payer: Self-pay | Admitting: Nurse Practitioner

## 2020-02-04 NOTE — Telephone Encounter (Signed)
Done

## 2020-02-06 ENCOUNTER — Other Ambulatory Visit: Payer: Self-pay

## 2020-02-06 ENCOUNTER — Ambulatory Visit (INDEPENDENT_AMBULATORY_CARE_PROVIDER_SITE_OTHER): Payer: Medicaid Other | Admitting: Nurse Practitioner

## 2020-02-06 DIAGNOSIS — F53 Postpartum depression: Secondary | ICD-10-CM | POA: Diagnosis not present

## 2020-02-06 DIAGNOSIS — D571 Sickle-cell disease without crisis: Secondary | ICD-10-CM

## 2020-02-06 DIAGNOSIS — Z91410 Personal history of adult physical and sexual abuse: Secondary | ICD-10-CM | POA: Diagnosis not present

## 2020-02-06 NOTE — Progress Notes (Signed)
Integrated Behavioral Health Note  Reason for Referral: Carmen [name] is a 29 y.o. female  Pt was referred by NP, Dionisio David for: post-partum depression  Pt reports the following concerns: post prtum depression, ongoing stress and depressive symptoms now a year after the birth of her baby.  Assessment: Patient had tele-visit with referring provider today. Provider referred to CSW for depression. CSW reached out to patient via phone x2 but was unable to reach patient today. Will continue to follow up and plan to assess patient's needs and address as appropriate.   Plan: 1. Addressed today: attempted to contact patient 2. Referral: none 3. Follow up: will attempt to contact patient again this week  Estanislado Emms, [place] [phone]
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
## 2020-02-07 ENCOUNTER — Ambulatory Visit (INDEPENDENT_AMBULATORY_CARE_PROVIDER_SITE_OTHER): Payer: Medicaid Other | Admitting: Nurse Practitioner

## 2020-02-07 ENCOUNTER — Other Ambulatory Visit: Payer: Self-pay

## 2020-02-07 ENCOUNTER — Ambulatory Visit: Payer: Medicaid Other

## 2020-02-07 ENCOUNTER — Encounter: Payer: Self-pay | Admitting: Nurse Practitioner

## 2020-02-07 DIAGNOSIS — F332 Major depressive disorder, recurrent severe without psychotic features: Secondary | ICD-10-CM

## 2020-02-07 DIAGNOSIS — Z79891 Long term (current) use of opiate analgesic: Secondary | ICD-10-CM

## 2020-02-07 DIAGNOSIS — D571 Sickle-cell disease without crisis: Secondary | ICD-10-CM | POA: Diagnosis not present

## 2020-02-07 MED ORDER — CITALOPRAM HYDROBROMIDE 10 MG PO TABS: 10.0000 mg | ORAL_TABLET | Freq: Every day | ORAL | 0 refills | Status: DC

## 2020-02-07 MED ORDER — OXYCODONE HCL 10 MG PO TABS: 10.0000 mg | ORAL_TABLET | Freq: Four times a day (QID) | ORAL | 0 refills | Status: DC | PRN

## 2020-02-07 NOTE — Patient Instructions (Signed)
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
## 2020-02-08 NOTE — Patient Instructions (Signed)

## 2020-02-11 ENCOUNTER — Encounter: Payer: Self-pay | Admitting: Clinical

## 2020-02-11 ENCOUNTER — Ambulatory Visit: Payer: Medicaid Other | Attending: Internal Medicine

## 2020-02-11 DIAGNOSIS — Z23 Encounter for immunization: Secondary | ICD-10-CM

## 2020-02-11 NOTE — Progress Notes (Signed)
   Covid-19 Vaccination Clinic  Name:  Carmen [name]    MRN: [id number] DOB: [birth year]  02/11/2020  [name] was observed post Covid-19 immunization for 15 minutes without incident. She was provided with Vaccine Information Sheet and instruction to access the V-Safe system.   [name] was instructed to call 911 with any severe reactions post vaccine: Marland Kitchen Difficulty breathing  . Swelling of face and throat  . A fast heartbeat  . A bad rash all over body  . Dizziness and weakness   Immunizations Administered    Name Date Dose VIS Date Route   Pfizer COVID-19 Vaccine 02/11/2020 12:29 PM 0.3 mL 10/12/2019 Intramuscular   Manufacturer: [place]   Lot: [id number]   [place]: [id number]

## 2020-02-18 ENCOUNTER — Ambulatory Visit (INDEPENDENT_AMBULATORY_CARE_PROVIDER_SITE_OTHER): Payer: Medicaid Other | Admitting: Clinical

## 2020-02-18 DIAGNOSIS — F332 Major depressive disorder, recurrent severe without psychotic features: Secondary | ICD-10-CM | POA: Diagnosis not present
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
## 2020-02-25 ENCOUNTER — Ambulatory Visit: Payer: Self-pay | Admitting: Clinical

## 2020-02-26 ENCOUNTER — Telehealth: Payer: Self-pay | Admitting: Nurse Practitioner

## 2020-02-26 ENCOUNTER — Other Ambulatory Visit: Payer: Self-pay | Admitting: Nurse Practitioner

## 2020-02-26 DIAGNOSIS — Z79891 Long term (current) use of opiate analgesic: Secondary | ICD-10-CM

## 2020-02-26 DIAGNOSIS — D571 Sickle-cell disease without crisis: Secondary | ICD-10-CM

## 2020-02-26 MED ORDER — OXYCODONE HCL 10 MG PO TABS: 10.0000 mg | ORAL_TABLET | Freq: Four times a day (QID) | ORAL | 0 refills | Status: DC | PRN

## 2020-02-27 NOTE — Telephone Encounter (Signed)
Done

## 2020-02-29 ENCOUNTER — Telehealth: Payer: Self-pay | Admitting: Nurse Practitioner

## 2020-02-29 ENCOUNTER — Other Ambulatory Visit: Payer: Self-pay | Admitting: Nurse Practitioner

## 2020-02-29 DIAGNOSIS — D571 Sickle-cell disease without crisis: Secondary | ICD-10-CM

## 2020-02-29 DIAGNOSIS — Z79891 Long term (current) use of opiate analgesic: Secondary | ICD-10-CM

## 2020-02-29 MED ORDER — MORPHINE SULFATE ER 60 MG PO TBCR: 60.0000 mg | EXTENDED_RELEASE_TABLET | Freq: Two times a day (BID) | ORAL | 0 refills | Status: DC

## 2020-02-29 NOTE — Telephone Encounter (Signed)
Prescription sent

## 2020-03-03 ENCOUNTER — Ambulatory Visit: Payer: Medicaid Other | Attending: Internal Medicine

## 2020-03-03 ENCOUNTER — Other Ambulatory Visit: Payer: Self-pay | Admitting: Obstetrics and Gynecology

## 2020-03-03 DIAGNOSIS — Z23 Encounter for immunization: Secondary | ICD-10-CM

## 2020-03-03 NOTE — Progress Notes (Signed)
   Covid-19 Vaccination Clinic  Name:  Carmen [name]    MRN: [id number] DOB: 1991-07-16  03/03/2020  [name] was observed post Covid-19 immunization for 15 minutes without incident. She was provided with Vaccine Information Sheet and instruction to access the V-Safe system.   [name] was instructed to call 911 with any severe reactions post vaccine: Marland Kitchen Difficulty breathing  . Swelling of face and throat  . A fast heartbeat  . A bad rash all over body  . Dizziness and weakness   Immunizations Administered    Name Date Dose VIS Date Route   Pfizer COVID-19 Vaccine 03/03/2020  1:55 PM 0.3 mL 12/26/2018 Intramuscular   Manufacturer: [place]   Lot: [id number]   [place]: [id number]      Covid-19 Vaccination Clinic  Name:  Carmen [name]    MRN: [id number] DOB: 1991/09/12  03/03/2020  [name] was observed post Covid-19 immunization for 15 minutes without incident. She was provided with Vaccine Information Sheet and instruction to access the V-Safe system.   [name] was instructed to call 911 with any severe reactions post vaccine: Marland Kitchen Difficulty breathing  . Swelling of face and throat  . A fast heartbeat  . A bad rash all over body  . Dizziness and weakness   Immunizations Administered    Name Date Dose VIS Date Route   Pfizer COVID-19 Vaccine 03/03/2020  1:55 PM 0.3 mL 12/26/2018 Intramuscular   Manufacturer: [place]   Lot: [id number]   [place]: [id number]

## 2020-03-07 ENCOUNTER — Ambulatory Visit: Payer: Self-pay | Admitting: Clinical

## 2020-03-18 ENCOUNTER — Telehealth: Payer: Self-pay | Admitting: Nurse Practitioner

## 2020-03-18 NOTE — Telephone Encounter (Signed)
Pt called in refill on oxycodone 10mg 

## 2020-03-19 ENCOUNTER — Other Ambulatory Visit: Payer: Self-pay | Admitting: Nurse Practitioner

## 2020-03-19 DIAGNOSIS — D571 Sickle-cell disease without crisis: Secondary | ICD-10-CM

## 2020-03-19 DIAGNOSIS — Z79891 Long term (current) use of opiate analgesic: Secondary | ICD-10-CM

## 2020-03-19 MED ORDER — OXYCODONE HCL 10 MG PO TABS: 10.0000 mg | ORAL_TABLET | Freq: Four times a day (QID) | ORAL | 0 refills | Status: DC | PRN

## 2020-03-19 NOTE — Telephone Encounter (Signed)
Sent!

## 2020-03-27 ENCOUNTER — Ambulatory Visit: Payer: Self-pay | Admitting: Nurse Practitioner

## 2020-03-28 ENCOUNTER — Encounter: Payer: Self-pay | Admitting: Nurse Practitioner

## 2020-03-28 ENCOUNTER — Other Ambulatory Visit: Payer: Self-pay

## 2020-03-28 ENCOUNTER — Ambulatory Visit: Payer: Medicaid Other | Admitting: Nurse Practitioner

## 2020-03-28 VITALS — BP 104/60 | HR 94 | Temp 99.0°F | Ht 63.0 in | Wt 180.0 lb

## 2020-03-28 DIAGNOSIS — M545 Low back pain, unspecified: Secondary | ICD-10-CM

## 2020-03-28 DIAGNOSIS — E559 Vitamin D deficiency, unspecified: Secondary | ICD-10-CM | POA: Diagnosis not present

## 2020-03-28 DIAGNOSIS — G8929 Other chronic pain: Secondary | ICD-10-CM

## 2020-03-28 DIAGNOSIS — D571 Sickle-cell disease without crisis: Secondary | ICD-10-CM

## 2020-03-28 DIAGNOSIS — Z79891 Long term (current) use of opiate analgesic: Secondary | ICD-10-CM

## 2020-03-28 DIAGNOSIS — F3341 Major depressive disorder, recurrent, in partial remission: Secondary | ICD-10-CM

## 2020-03-28 DIAGNOSIS — J301 Allergic rhinitis due to pollen: Secondary | ICD-10-CM

## 2020-03-28 MED ORDER — CYCLOBENZAPRINE HCL 10 MG PO TABS: 10.0000 mg | ORAL_TABLET | Freq: Two times a day (BID) | ORAL | 2 refills | Status: DC

## 2020-03-28 MED ORDER — OXYCODONE HCL 10 MG PO TABS: 10.0000 mg | ORAL_TABLET | Freq: Four times a day (QID) | ORAL | 0 refills | Status: DC | PRN

## 2020-03-28 MED ORDER — LORATADINE 10 MG PO TABS: 10.0000 mg | ORAL_TABLET | Freq: Every day | ORAL | 11 refills | Status: DC

## 2020-03-28 MED ORDER — CITALOPRAM HYDROBROMIDE 10 MG PO TABS: 10.0000 mg | ORAL_TABLET | Freq: Every day | ORAL | 2 refills | Status: DC

## 2020-03-28 MED ORDER — MORPHINE SULFATE ER 60 MG PO TBCR: 60.0000 mg | EXTENDED_RELEASE_TABLET | Freq: Two times a day (BID) | ORAL | 0 refills | Status: DC

## 2020-03-28 NOTE — Progress Notes (Signed)
[address], [place]  [address] Phone:  [phone]   Fax:  [phone]   Established Patient Office Visit  Subjective:  Patient ID: Carmen [name], female    DOB: 09/26/1991  Age: 29 y.o. MRN: [id number]  CC:  Chief Complaint  Patient presents with  . Depression    pt stated last taken Rx citalopram--2 weeks ago.    HPI Carmen [name] presents for depression. She  has a past medical history of Headache(784.0), Sickle cell crisis ([place]), Syphilis (2015), and Thrombocytosis ([place]) (11/22/2014).   Depression Patient complains of depression. She previously complains of depressed mood, difficulty concentrating, fatigue, feelings of worthlessness/guilt and hopelessness. Onset was approximately 1 year ago. Symptoms have been gradually improving since that time. Current symptoms include: none. She is feeling better. . Patient denies depressed mood, fatigue, insomnia, recurrent thoughts of death and suicidal thoughts without plan. Family history significant for Unknown. Possible organic causes contributing are: situational. Post partum depression was a factor . Risk factors: negative life event abuse. Previous treatment includes individual therapy.She was started on Celexa and did well. She was unable to obtain a refill.  She complains of the following side effects from the treatment: none.   Past Medical History:  Diagnosis Date  . Headache(784.0)   . Sickle cell crisis ([place])   . Syphilis 2015   Was diagnosed and received one injection of antibiotics  . Thrombocytosis ([place]) 11/22/2014     CBC Latest Ref Rng & Units 12/13/2018 12/11/2018 12/10/2018 WBC 4.0 - 10.5 K/uL 14.8(H) 13.2(H) 15.9(H) Hemoglobin 12.0 - 15.0 g/dL 8.2(L) 7.7(L) 8.1(L) Hematocrit 36.0 - 46.0 % 23.7(L) 23.2(L) 24.5(L) Platelets 150 - 400 K/uL 326 399 388      Past Surgical History:  Procedure Laterality Date  . CESAREAN SECTION N/A 02/05/2019   Procedure: CESAREAN SECTION;  Surgeon:  [name], MD;  Location: [facility] ORS;  Service: Obstetrics;  Laterality: N/A;  . CHOLECYSTECTOMY N/A 11/30/2014   Procedure: LAPAROSCOPIC CHOLECYSTECTOMY SINGLE SITE WITH INTRAOPERATIVE CHOLANGIOGRAM;  Surgeon: Michael Boston, MD;  Location: [facility] ORS;  Service: General;  Laterality: N/A;  . SPLENECTOMY      Family History  Problem Relation Age of Onset  . Hypertension Mother   . Sickle cell anemia Sister   . Kidney disease Sister        Lupus  . Arthritis Sister   . Sickle cell anemia Sister   . Sickle cell trait Sister   . Heart disease Maternal Aunt        CABG  . Heart disease Maternal Uncle        CABG  . Lupus Sister     Social History   Socioeconomic History  . Marital status: Single    Spouse name: Not on file  . Number of children: Not on file  . Years of education: Not on file  . Highest education level: Not on file  Occupational History  . Not on file  Tobacco Use  . Smoking status: Current Some Day Smoker    Types: Cigarettes  . Smokeless tobacco: Never Used  Substance and Sexual Activity  . Alcohol use: No  . Drug use: No  . Sexual activity: Yes    Birth control/protection: Injection  Other Topics Concern  . Not on file  Social History Narrative  . Not on file   Social Determinants of Health   Financial Resource Strain:   . Difficulty of Paying Living  Expenses:   Food Insecurity:   . Worried About Charity fundraiser in the Last Year:   . Arboriculturist in the Last Year:   Transportation Needs:   . Film/video editor (Medical):   Marland Kitchen Lack of Transportation (Non-Medical):   Physical Activity:   . Days of Exercise per Week:   . Minutes of Exercise per Session:   Stress:   . Feeling of Stress :   Social Connections:   . Frequency of Communication with Friends and Family:   . Frequency of Social Gatherings with Friends and Family:   . Attends Religious Services:   . Active Member of Clubs or Organizations:   . Attends Archivist  Meetings:   Marland Kitchen Marital Status:   Intimate Partner Violence:   . Fear of Current or Ex-Partner:   . Emotionally Abused:   Marland Kitchen Physically Abused:   . Sexually Abused:     Outpatient Medications Prior to Visit  Medication Sig Dispense Refill  . acetaminophen (TYLENOL) 500 MG tablet Take 2 tablets (1,000 mg total) by mouth 2 (two) times daily as needed. 30 tablet 0  . aspirin EC 81 MG tablet Take 1 tablet (81 mg total) by mouth daily. 90 tablet 3  . folic acid (FOLVITE) 1 MG tablet Take 1 tablet (1 mg total) by mouth daily. 90 tablet 3  . Prenatal Vit-Fe Fumarate-FA (PRENATAL VITAMIN PLUS LOW IRON) 27-1 MG TABS TAKE 1 TABLET BY MOUTH EVERY DAY 30 tablet 13  . Vitamin D, Ergocalciferol, (DRISDOL) 1.25 MG (50000 UT) CAPS capsule TAKE ONE CAPSULE BY MOUTH ONE TIME PER WEEK (Patient taking differently: Take 50,000 Units by mouth every 7 (seven) days. ) 30 capsule 1  . cyclobenzaprine (FLEXERIL) 10 MG tablet Take 1 tablet (10 mg total) by mouth 2 (two) times daily. 60 tablet 3  . morphine (MS CONTIN) 60 MG 12 hr tablet Take 1 tablet (60 mg total) by mouth every 12 (twelve) hours. 60 tablet 0  . Oxycodone HCl 10 MG TABS Take 1 tablet (10 mg total) by mouth every 6 (six) hours as needed for up to 15 days (PAIN). 60 tablet 0  . ibuprofen (ADVIL) 800 MG tablet Take 1 tablet (800 mg total) by mouth 3 (three) times daily with meals as needed. (Patient not taking: Reported on 02/02/2020) 30 tablet 1  . citalopram (CELEXA) 10 MG tablet Take 1 tablet (10 mg total) by mouth daily. (Patient not taking: Reported on 03/28/2020) 30 tablet 0   No facility-administered medications prior to visit.    No Known Allergies  ROS Review of Systems  All other systems reviewed and are negative.     Objective:    Physical Exam  Constitutional: She is oriented to person, place, and time. She appears well-developed. No distress.  HENT:  Head: Normocephalic and atraumatic.  Eyes: Pupils are equal, round, and reactive to  light.  Cardiovascular: Normal rate, regular rhythm, normal heart sounds and intact distal pulses.  Pulmonary/Chest: Effort normal and breath sounds normal.  Abdominal: Soft. Bowel sounds are normal.  Musculoskeletal:        General: Normal range of motion.     Cervical back: Normal range of motion.  Neurological: She is alert and oriented to person, place, and time.  Skin: Skin is warm and dry.  Psychiatric: She has a normal mood and affect. Her behavior is normal. Judgment and thought content normal.    BP 104/60   Pulse 94  Temp 99 F (37.2 C)   Ht 5\' 3"  (1.6 m)   Wt 180 lb (81.6 kg)   SpO2 100%   BMI 31.89 kg/m  Wt Readings from Last 3 Encounters:  03/28/20 180 lb (81.6 kg)  02/02/20 175 lb (79.4 kg)  01/02/20 164 lb 10.9 oz (74.7 kg)     There are no preventive care reminders to display for this patient.  There are no preventive care reminders to display for this patient.  No results found for: TSH Lab Results  Component Value Date   WBC 7.2 02/02/2020   HGB 11.5 (L) 02/02/2020   HCT 32.5 (L) 02/02/2020   MCV 84.0 02/02/2020   PLT 589 (H) 02/02/2020   Lab Results  Component Value Date   NA 143 02/02/2020   K 4.1 02/02/2020   CO2 23 02/02/2020   GLUCOSE 96 02/02/2020   BUN 9 02/02/2020   CREATININE 0.44 02/02/2020   BILITOT 1.1 01/01/2020   ALKPHOS 70 01/01/2020   AST 24 01/01/2020   ALT 29 01/01/2020   PROT 7.5 01/01/2020   ALBUMIN 4.0 01/01/2020   CALCIUM 9.7 02/02/2020   ANIONGAP 9 02/02/2020   No results found for: CHOL No results found for: HDL No results found for: LDLCALC No results found for: TRIG No results found for: CHOLHDL No results found for: HGBA1C    Assessment & Plan:   Problem List Items Addressed This Visit      Other   Chronic prescription opiate use   Relevant Medications   morphine (MS CONTIN) 60 MG 12 hr tablet (Start on 04/02/2020)   Oxycodone HCl 10 MG TABS (Start on 04/05/2020)   Other Relevant Orders   [id number]  11+Oxyco+Alc+Crt-Bund   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)   Relevant Medications   morphine (MS CONTIN) 60 MG 12 hr tablet (Start on 04/02/2020)   Oxycodone HCl 10 MG TABS (Start on 04/05/2020)   Other Relevant Orders   Sickle Cell Panel   [id number] 11+Oxyco+Alc+Crt-Bund   Vitamin D deficiency    Other Visit Diagnoses    Chronic low back pain without sciatica, unspecified back pain laterality    -  Primary   Relevant Medications   morphine (MS CONTIN) 60 MG 12 hr tablet (Start on 04/02/2020)   Oxycodone HCl 10 MG TABS (Start on 04/05/2020)   cyclobenzaprine (FLEXERIL) 10 MG tablet   Recurrent major depressive disorder, in partial remission (HCC)       Citalopram refilled we will continue to monitor Individual counseling set up   Relevant Medications   citalopram (CELEXA) 10 MG tablet   Seasonal allergic rhinitis due to pollen       Relevant Medications   loratadine (CLARITIN) 10 MG tablet      Meds ordered this encounter  Medications  . morphine (MS CONTIN) 60 MG 12 hr tablet    Sig: Take 1 tablet (60 mg total) by mouth every 12 (twelve) hours.    Dispense:  60 tablet    Refill:  0    May fill on 04/02/2020    Order Specific Question:   Supervising Provider    Answer:   Tresa Garter [id number]  . Oxycodone HCl 10 MG TABS    Sig: Take 1 tablet (10 mg total) by mouth every 6 (six) hours as needed for up to 15 days (PAIN).    Dispense:  60 tablet    Refill:  0    May fill on 04/05/20. Thanks  Order Specific Question:   Supervising Provider    Answer:   Tresa Garter [id number]  . cyclobenzaprine (FLEXERIL) 10 MG tablet    Sig: Take 1 tablet (10 mg total) by mouth 2 (two) times daily.    Dispense:  60 tablet    Refill:  2    Order Specific Question:   Supervising Provider    Answer:   Tresa Garter [id number]  . citalopram (CELEXA) 10 MG tablet    Sig: Take 1 tablet (10 mg total) by mouth daily.    Dispense:  30 tablet    Refill:  2    Order Specific  Question:   Supervising Provider    Answer:   Tresa Garter [id number]  . loratadine (CLARITIN) 10 MG tablet    Sig: Take 1 tablet (10 mg total) by mouth daily.    Dispense:  30 tablet    Refill:  11    Order Specific Question:   Supervising Provider    Answer:   Tresa Garter [id number]    Follow-up: Return in about 3 months (around 06/28/2020).    Vevelyn Francois, NP

## 2020-03-28 NOTE — Progress Notes (Signed)
[facility] General Follow Up Note  03/28/2020 Name: Carmen [name] MRN: [id number] DOB: July 11, 1991 Carmen [name] is a 29 y.o. year old female who sees Vevelyn Francois, NP for primary care. LCSW was initially consulted to assess mental health needs and assist the patient with Mental Health Counseling and Resources.  Ongoing Intervention: Patient experiencing depression. Today patient presented for PCP visit. CSW met with patient during visit to assess patient's ongoing interest in counseling. Patient was lost to follow up after initial assessment. Patient would like to continue counseling, scheduled IBH appointment with this CSW for 04/07/20.  Review of patient status, including review of consultants reports, relevant laboratory and other test results, and collaboration with appropriate care team members and the patient's provider was performed as part of comprehensive patient evaluation and provision of services.     Follow up Plan: 1. Yoe session 04/07/20  Estanislado Emms, [facility] [phone]

## 2020-03-28 NOTE — Patient Instructions (Signed)
Managing Anxiety, Adult After being diagnosed with an anxiety disorder, you may be relieved to know why you have felt or behaved a certain way. You may also feel overwhelmed about the treatment ahead and what it will mean for your life. With care and support, you can manage this condition and recover from it. How to manage lifestyle changes Managing stress and anxiety  Stress is your body's reaction to life changes and events, both good and bad. Most stress will last just a few hours, but stress can be ongoing and can lead to more than just stress. Although stress can play a major role in anxiety, it is not the same as anxiety. Stress is usually caused by something external, such as a deadline, test, or competition. Stress normally passes after the triggering event has ended.  Anxiety is caused by something internal, such as imagining a terrible outcome or worrying that something will go wrong that will devastate you. Anxiety often does not go away even after the triggering event is over, and it can become long-term (chronic) worry. It is important to understand the differences between stress and anxiety and to manage your stress effectively so that it does not lead to an anxious response. Talk with your health care provider or a counselor to learn more about reducing anxiety and stress. He or she may suggest tension reduction techniques, such as:  Music therapy. This can include creating or listening to music that you enjoy and that inspires you.  Mindfulness-based meditation. This involves being aware of your normal breaths while not trying to control your breathing. It can be done while sitting or walking.  Centering prayer. This involves focusing on a word, phrase, or sacred image that means something to you and brings you peace.  Deep breathing. To do this, expand your stomach and inhale slowly through your nose. Hold your breath for 3-5 seconds. Then exhale slowly, letting your stomach muscles  relax.  Self-talk. This involves identifying thought patterns that lead to anxiety reactions and changing those patterns.  Muscle relaxation. This involves tensing muscles and then relaxing them. Choose a tension reduction technique that suits your lifestyle and personality. These techniques take time and practice. Set aside 5-15 minutes a day to do them. Therapists can offer counseling and training in these techniques. The training to help with anxiety may be covered by some insurance plans. Other things you can do to manage stress and anxiety include:  Keeping a stress/anxiety diary. This can help you learn what triggers your reaction and then learn ways to manage your response.  Thinking about how you react to certain situations. You may not be able to control everything, but you can control your response.  Making time for activities that help you relax and not feeling guilty about spending your time in this way.  Visual imagery and yoga can help you stay calm and relax.  Medicines Medicines can help ease symptoms. Medicines for anxiety include:  Anti-anxiety drugs.  Antidepressants. Medicines are often used as a primary treatment for anxiety disorder. Medicines will be prescribed by a health care provider. When used together, medicines, psychotherapy, and tension reduction techniques may be the most effective treatment. Relationships Relationships can play a big part in helping you recover. Try to spend more time connecting with trusted friends and family members. Consider going to couples counseling, taking family education classes, or going to family therapy. Therapy can help you and others better understand your condition. How to recognize changes in your   anxiety Everyone responds differently to treatment for anxiety. Recovery from anxiety happens when symptoms decrease and stop interfering with your daily activities at home or work. This may mean that you will start to:  Have  better concentration and focus. Worry will interfere less in your daily thinking.  Sleep better.  Be less irritable.  Have more energy.  Have improved memory. It is important to recognize when your condition is getting worse. Contact your health care provider if your symptoms interfere with home or work and you feel like your condition is not improving. Follow these instructions at home: Activity  Exercise. Most adults should do the following: ? Exercise for at least 150 minutes each week. The exercise should increase your heart rate and make you sweat (moderate-intensity exercise). ? Strengthening exercises at least twice a week.  Get the right amount and quality of sleep. Most adults need 7-9 hours of sleep each night. Lifestyle   Eat a healthy diet that includes plenty of vegetables, fruits, whole grains, low-fat dairy products, and lean protein. Do not eat a lot of foods that are high in solid fats, added sugars, or salt.  Make choices that simplify your life.  Do not use any products that contain nicotine or tobacco, such as cigarettes, e-cigarettes, and chewing tobacco. If you need help quitting, ask your health care provider.  Avoid caffeine, alcohol, and certain over-the-counter cold medicines. These may make you feel worse. Ask your pharmacist which medicines to avoid. General instructions  Take over-the-counter and prescription medicines only as told by your health care provider.  Keep all follow-up visits as told by your health care provider. This is important. Where to find support You can get help and support from these sources:  Self-help groups.  Online and community organizations.  A trusted spiritual leader.  Couples counseling.  Family education classes.  Family therapy. Where to find more information You may find that joining a support group helps you deal with your anxiety. The following sources can help you locate counselors or support groups near  you:  Mental Health America: www.mentalhealthamerica.net  Anxiety and Depression Association of America (ADAA): www.adaa.org  National Alliance on Mental Illness (NAMI): www.nami.org Contact a health care provider if you:  Have a hard time staying focused or finishing daily tasks.  Spend many hours a day feeling worried about everyday life.  Become exhausted by worry.  Start to have headaches, feel tense, or have nausea.  Urinate more than normal.  Have diarrhea. Get help right away if you have:  A racing heart and shortness of breath.  Thoughts of hurting yourself or others. If you ever feel like you may hurt yourself or others, or have thoughts about taking your own life, get help right away. You can go to your nearest emergency department or call:  Your local emergency services (911 in the [place]  A suicide crisis helpline, such as the National Suicide Prevention Lifeline at 1-800-273-8255. This is open 24 hours a day. Summary  Taking steps to learn and use tension reduction techniques can help calm you and help prevent triggering an anxiety reaction.  When used together, medicines, psychotherapy, and tension reduction techniques may be the most effective treatment.  Family, friends, and partners can play a big part in helping you recover from an anxiety disorder. This information is not intended to replace advice given to you by your health care provider. Make sure you discuss any questions you have with your health care provider. Document Revised:   03/20/2019 Document Reviewed: 03/20/2019 Elsevier Patient Education  2020 [employer].  

## 2020-03-29 LAB — CMP14+CBC/D/PLT+FER+RETIC+V...
ALT: 14 IU/L (ref 0–32)
AST: 19 IU/L (ref 0–40)
Albumin/Globulin Ratio: 1.2 (ref 1.2–2.2)
Albumin: 4.2 g/dL (ref 3.9–5.0)
Alkaline Phosphatase: 73 IU/L (ref 48–121)
BUN/Creatinine Ratio: 15 (ref 9–23)
BUN: 9 mg/dL (ref 6–20)
Basophils Absolute: 0.1 10*3/uL (ref 0.0–0.2)
Basos: 0 %
Bilirubin Total: 1 mg/dL (ref 0.0–1.2)
CO2: 22 mmol/L (ref 20–29)
Calcium: 9.5 mg/dL (ref 8.7–10.2)
Chloride: 100 mmol/L (ref 96–106)
Creatinine, Ser: 0.6 mg/dL (ref 0.57–1.00)
EOS (ABSOLUTE): 0.2 10*3/uL (ref 0.0–0.4)
Eos: 2 %
Ferritin: 278 ng/mL — ABNORMAL HIGH (ref 15–150)
GFR calc Af Amer: 144 mL/min/{1.73_m2} (ref >59)
GFR calc non Af Amer: 125 mL/min/{1.73_m2} (ref >59)
Globulin, Total: 3.4 g/dL (ref 1.5–4.5)
Glucose: 78 mg/dL (ref 65–99)
Hematocrit: 29.8 % — ABNORMAL LOW (ref 34.0–46.6)
Hemoglobin: 9.8 g/dL — ABNORMAL LOW (ref 11.1–15.9)
Immature Grans (Abs): 0.1 10*3/uL (ref 0.0–0.1)
Immature Granulocytes: 1 %
Lymphocytes Absolute: 4.6 10*3/uL — ABNORMAL HIGH (ref 0.7–3.1)
Lymphs: 40 %
MCH: 29.3 pg (ref 26.6–33.0)
MCHC: 32.9 g/dL (ref 31.5–35.7)
MCV: 89 fL (ref 79–97)
Monocytes Absolute: 1 10*3/uL — ABNORMAL HIGH (ref 0.1–0.9)
Monocytes: 9 %
Neutrophils Absolute: 5.6 10*3/uL (ref 1.4–7.0)
Neutrophils: 48 %
Platelets: 516 10*3/uL — ABNORMAL HIGH (ref 150–450)
Potassium: 4.4 mmol/L (ref 3.5–5.2)
RBC: 3.35 x10E6/uL — ABNORMAL LOW (ref 3.77–5.28)
RDW: 15.9 % — ABNORMAL HIGH (ref 11.7–15.4)
Retic Ct Pct: 5.2 % — ABNORMAL HIGH (ref 0.6–2.6)
Sodium: 135 mmol/L (ref 134–144)
Total Protein: 7.6 g/dL (ref 6.0–8.5)
Vit D, 25-Hydroxy: 18.7 ng/mL — ABNORMAL LOW (ref 30.0–100.0)
WBC: 11.5 10*3/uL — ABNORMAL HIGH (ref 3.4–10.8)

## 2020-03-31 ENCOUNTER — Other Ambulatory Visit: Payer: Self-pay

## 2020-03-31 ENCOUNTER — Emergency Department (HOSPITAL_COMMUNITY): Payer: Medicaid Other

## 2020-03-31 ENCOUNTER — Inpatient Hospital Stay (HOSPITAL_COMMUNITY)
Admission: EM | Admit: 2020-03-31 | Discharge: 2020-04-02 | DRG: 812 | Discharge status: Against Medical Advice | Payer: Medicaid Other | Attending: Internal Medicine | Admitting: Internal Medicine

## 2020-03-31 DIAGNOSIS — Z832 Family history of diseases of the blood and blood-forming organs and certain disorders involving the immune mechanism: Secondary | ICD-10-CM | POA: Diagnosis not present

## 2020-03-31 DIAGNOSIS — F17210 Nicotine dependence, cigarettes, uncomplicated: Secondary | ICD-10-CM | POA: Diagnosis present

## 2020-03-31 DIAGNOSIS — D5700 Hb-SS disease with crisis, unspecified: Secondary | ICD-10-CM | POA: Diagnosis not present

## 2020-03-31 DIAGNOSIS — F209 Schizophrenia, unspecified: Secondary | ICD-10-CM | POA: Diagnosis present

## 2020-03-31 DIAGNOSIS — Z5329 Procedure and treatment not carried out because of patient's decision for other reasons: Secondary | ICD-10-CM | POA: Diagnosis present

## 2020-03-31 DIAGNOSIS — Z20822 Contact with and (suspected) exposure to covid-19: Secondary | ICD-10-CM | POA: Diagnosis present

## 2020-03-31 DIAGNOSIS — E559 Vitamin D deficiency, unspecified: Secondary | ICD-10-CM | POA: Diagnosis present

## 2020-03-31 DIAGNOSIS — F319 Bipolar disorder, unspecified: Secondary | ICD-10-CM | POA: Diagnosis present

## 2020-03-31 DIAGNOSIS — J069 Acute upper respiratory infection, unspecified: Secondary | ICD-10-CM | POA: Diagnosis present

## 2020-03-31 DIAGNOSIS — Z79899 Other long term (current) drug therapy: Secondary | ICD-10-CM

## 2020-03-31 DIAGNOSIS — F1120 Opioid dependence, uncomplicated: Secondary | ICD-10-CM | POA: Diagnosis present

## 2020-03-31 DIAGNOSIS — G894 Chronic pain syndrome: Secondary | ICD-10-CM | POA: Diagnosis present

## 2020-03-31 DIAGNOSIS — R079 Chest pain, unspecified: Secondary | ICD-10-CM | POA: Diagnosis present

## 2020-03-31 DIAGNOSIS — Z7982 Long term (current) use of aspirin: Secondary | ICD-10-CM

## 2020-03-31 DIAGNOSIS — F609 Personality disorder, unspecified: Secondary | ICD-10-CM | POA: Diagnosis present

## 2020-03-31 LAB — CBC WITH DIFFERENTIAL/PLATELET
Abs Immature Granulocytes: 0.05 10*3/uL (ref 0.00–0.07)
Basophils Absolute: 0.1 10*3/uL (ref 0.0–0.1)
Basophils Relative: 1 %
Eosinophils Absolute: 0.3 10*3/uL (ref 0.0–0.5)
Eosinophils Relative: 3 %
HCT: 30.1 % — ABNORMAL LOW (ref 36.0–46.0)
Hemoglobin: 10.7 g/dL — ABNORMAL LOW (ref 12.0–15.0)
Immature Granulocytes: 1 %
Lymphocytes Relative: 16 %
Lymphs Abs: 1.7 10*3/uL (ref 0.7–4.0)
MCH: 29.5 pg (ref 26.0–34.0)
MCHC: 35.5 g/dL (ref 30.0–36.0)
MCV: 82.9 fL (ref 80.0–100.0)
Monocytes Absolute: 1.2 10*3/uL — ABNORMAL HIGH (ref 0.1–1.0)
Monocytes Relative: 11 %
Neutro Abs: 7.1 10*3/uL (ref 1.7–7.7)
Neutrophils Relative %: 68 %
Platelets: 568 10*3/uL — ABNORMAL HIGH (ref 150–400)
RBC: 3.63 MIL/uL — ABNORMAL LOW (ref 3.87–5.11)
RDW: 14.1 % (ref 11.5–15.5)
WBC: 10.3 10*3/uL (ref 4.0–10.5)
nRBC: 0.4 % — ABNORMAL HIGH (ref 0.0–0.2)

## 2020-03-31 LAB — RETICULOCYTES
Immature Retic Fract: 31.1 % — ABNORMAL HIGH (ref 2.3–15.9)
RBC.: 3.59 MIL/uL — ABNORMAL LOW (ref 3.87–5.11)
Retic Count, Absolute: 204.3 10*3/uL — ABNORMAL HIGH (ref 19.0–186.0)
Retic Ct Pct: 5.7 % — ABNORMAL HIGH (ref 0.4–3.1)

## 2020-03-31 LAB — COMPREHENSIVE METABOLIC PANEL
ALT: 21 U/L (ref 0–44)
AST: 26 U/L (ref 15–41)
Albumin: 4.7 g/dL (ref 3.5–5.0)
Alkaline Phosphatase: 67 U/L (ref 38–126)
Anion gap: 10 (ref 5–15)
BUN: 8 mg/dL (ref 6–20)
CO2: 23 mmol/L (ref 22–32)
Calcium: 9.4 mg/dL (ref 8.9–10.3)
Chloride: 106 mmol/L (ref 98–111)
Creatinine, Ser: 0.54 mg/dL (ref 0.44–1.00)
GFR calc Af Amer: >60 mL/min (ref >60)
GFR calc non Af Amer: >60 mL/min (ref >60)
Glucose, Bld: 93 mg/dL (ref 70–99)
Potassium: 3.7 mmol/L (ref 3.5–5.1)
Sodium: 139 mmol/L (ref 135–145)
Total Bilirubin: 1.5 mg/dL — ABNORMAL HIGH (ref 0.3–1.2)
Total Protein: 8.6 g/dL — ABNORMAL HIGH (ref 6.5–8.1)

## 2020-03-31 LAB — SARS CORONAVIRUS 2 BY RT PCR (HOSPITAL ORDER, PERFORMED IN ~~LOC~~ HOSPITAL LAB): SARS Coronavirus 2: NEGATIVE

## 2020-03-31 LAB — I-STAT BETA HCG BLOOD, ED (MC, WL, AP ONLY): I-stat hCG, quantitative: <5 m[IU]/mL (ref <5)

## 2020-03-31 MED ORDER — KETOROLAC TROMETHAMINE 15 MG/ML IJ SOLN
15.0000 mg | INTRAMUSCULAR | Status: AC
  Administered 2020-03-31: 15 mg via INTRAVENOUS
  Filled 2020-03-31: qty 1

## 2020-03-31 MED ORDER — SODIUM CHLORIDE 0.9% FLUSH: 9.0000 mL | INTRAVENOUS | Status: DC | PRN

## 2020-03-31 MED ORDER — HYDROMORPHONE HCL 2 MG/ML IJ SOLN
2.0000 mg | INTRAMUSCULAR | Status: AC
  Administered 2020-03-31: 2 mg via INTRAVENOUS
  Filled 2020-03-31: qty 1

## 2020-03-31 MED ORDER — HYDROMORPHONE 1 MG/ML IV SOLN
INTRAVENOUS | Status: DC
  Administered 2020-04-01: 30 mg via INTRAVENOUS
  Administered 2020-04-01: 4.5 mg via INTRAVENOUS
  Administered 2020-04-01: 1.5 mg via INTRAVENOUS
  Administered 2020-04-01: 6.5 mg via INTRAVENOUS
  Administered 2020-04-02: 30 mg via INTRAVENOUS
  Administered 2020-04-02: 5.5 mg via INTRAVENOUS
  Administered 2020-04-02: 8.5 mg via INTRAVENOUS
  Administered 2020-04-02: 3 mg via INTRAVENOUS
  Filled 2020-03-31 (×3): qty 30

## 2020-03-31 MED ORDER — BISACODYL 5 MG PO TBEC: 5.0000 mg | DELAYED_RELEASE_TABLET | Freq: Every day | ORAL | Status: DC | PRN

## 2020-03-31 MED ORDER — MORPHINE SULFATE ER 30 MG PO TBCR: 60.0000 mg | EXTENDED_RELEASE_TABLET | Freq: Two times a day (BID) | ORAL | Status: DC

## 2020-03-31 MED ORDER — KETOROLAC TROMETHAMINE 30 MG/ML IJ SOLN
30.0000 mg | Freq: Four times a day (QID) | INTRAMUSCULAR | Status: DC
  Administered 2020-03-31 – 2020-04-02 (×5): 30 mg via INTRAVENOUS
  Filled 2020-03-31 (×5): qty 1

## 2020-03-31 MED ORDER — BENZONATATE 100 MG PO CAPS
100.0000 mg | ORAL_CAPSULE | Freq: Once | ORAL | Status: AC
  Administered 2020-03-31: 100 mg via ORAL
  Filled 2020-03-31: qty 1

## 2020-03-31 MED ORDER — DEXTROSE-NACL 5-0.45 % IV SOLN: INTRAVENOUS | Status: DC

## 2020-03-31 MED ORDER — SENNOSIDES-DOCUSATE SODIUM 8.6-50 MG PO TABS
1.0000 | ORAL_TABLET | Freq: Two times a day (BID) | ORAL | Status: DC
  Administered 2020-03-31 – 2020-04-01 (×2): 1 via ORAL
  Filled 2020-03-31 (×3): qty 1

## 2020-03-31 MED ORDER — FOLIC ACID 1 MG PO TABS
1.0000 mg | ORAL_TABLET | Freq: Every day | ORAL | Status: DC
  Administered 2020-03-31 – 2020-04-01 (×2): 1 mg via ORAL
  Filled 2020-03-31 (×2): qty 1

## 2020-03-31 MED ORDER — ENOXAPARIN SODIUM 40 MG/0.4ML ~~LOC~~ SOLN
40.0000 mg | SUBCUTANEOUS | Status: DC
  Administered 2020-03-31: 40 mg via SUBCUTANEOUS
  Filled 2020-03-31 (×2): qty 0.4

## 2020-03-31 MED ORDER — CYCLOBENZAPRINE HCL 10 MG PO TABS
10.0000 mg | ORAL_TABLET | Freq: Two times a day (BID) | ORAL | Status: DC
  Administered 2020-03-31 – 2020-04-01 (×3): 10 mg via ORAL
  Filled 2020-03-31 (×3): qty 1

## 2020-03-31 MED ORDER — ASPIRIN EC 81 MG PO TBEC
81.0000 mg | DELAYED_RELEASE_TABLET | Freq: Every day | ORAL | Status: DC
  Administered 2020-04-01: 81 mg via ORAL
  Filled 2020-03-31: qty 1

## 2020-03-31 MED ORDER — CITALOPRAM HYDROBROMIDE 20 MG PO TABS
10.0000 mg | ORAL_TABLET | Freq: Every day | ORAL | Status: DC
  Administered 2020-04-01: 10 mg via ORAL
  Filled 2020-03-31: qty 1

## 2020-03-31 MED ORDER — ONDANSETRON HCL 4 MG/2ML IJ SOLN: 4.0000 mg | Freq: Four times a day (QID) | INTRAMUSCULAR | Status: DC | PRN

## 2020-03-31 MED ORDER — ONDANSETRON HCL 4 MG/2ML IJ SOLN: 4.0000 mg | INTRAMUSCULAR | Status: DC | PRN

## 2020-03-31 MED ORDER — SODIUM CHLORIDE 0.9% FLUSH
3.0000 mL | Freq: Once | INTRAVENOUS | Status: AC
  Administered 2020-03-31: 3 mL via INTRAVENOUS

## 2020-03-31 MED ORDER — DIPHENHYDRAMINE HCL 50 MG/ML IJ SOLN
25.0000 mg | Freq: Once | INTRAMUSCULAR | Status: AC
  Administered 2020-03-31: 25 mg via INTRAVENOUS
  Filled 2020-03-31: qty 1

## 2020-03-31 MED ORDER — SODIUM CHLORIDE 0.45 % IV SOLN: INTRAVENOUS | Status: DC

## 2020-03-31 MED ORDER — POLYETHYLENE GLYCOL 3350 17 G PO PACK: 17.0000 g | PACK | Freq: Every day | ORAL | Status: DC | PRN

## 2020-03-31 MED ORDER — HYDROXYZINE HCL 25 MG PO TABS
25.0000 mg | ORAL_TABLET | ORAL | Status: DC | PRN
  Administered 2020-04-01: 50 mg via ORAL
  Filled 2020-03-31: qty 2

## 2020-03-31 MED ORDER — LORATADINE 10 MG PO TABS
10.0000 mg | ORAL_TABLET | Freq: Every day | ORAL | Status: DC
  Administered 2020-04-01: 10 mg via ORAL
  Filled 2020-03-31: qty 1

## 2020-03-31 MED ORDER — NALOXONE HCL 0.4 MG/ML IJ SOLN: 0.4000 mg | INTRAMUSCULAR | Status: DC | PRN

## 2020-03-31 NOTE — ED Notes (Signed)
Pt refusing blood draw until gets to a room

## 2020-03-31 NOTE — ED Provider Notes (Signed)
[facility] DEPT Provider Note   CSN: [id number] Arrival date & time: 03/31/20  1513     History Chief Complaint  Patient presents with  . Sickle Cell Pain Crisis  . Cough    ANACELIA MEIKLE is a 29 y.o. female.  The history is provided by the patient and medical records. No language interpreter was used.  Sickle Cell Pain Crisis Associated symptoms: cough   Cough    29 year old female with history of of sickle cell disease type SS presenting complaining of sickle cell related pain.  Patient states for the past 2 days she is having diffuse body aches and joint pain throughout, having throat irritation, as well as some nonproductive cough.  She also endorsed congestion as well.  She does not complain of any fever or chills no severe headache, no shortness of breath or chest pain no dysuria or rash.  Denies any recent sick exposure.  She is up-to-date with her Covid vaccination.  She has been taking her sickle cell pain medication at home without relief.  She does not complain of nausea vomiting or diarrhea or loss of taste or smell.  Last menstrual period was 5/13.  She felt her pain is similar to prior sickle cell related pain.  Unsure of aggravating factor.  Past Medical History:  Diagnosis Date  . Headache(784.0)   . Sickle cell crisis ([place])   . Syphilis 2015   Was diagnosed and received one injection of antibiotics  . Thrombocytosis ([place]) 11/22/2014     CBC Latest Ref Rng & Units 12/13/2018 12/11/2018 12/10/2018 WBC 4.0 - 10.5 K/uL 14.8(H) 13.2(H) 15.9(H) Hemoglobin 12.0 - 15.0 g/dL 8.2(L) 7.7(L) 8.1(L) Hematocrit 36.0 - 46.0 % 23.7(L) 23.2(L) 24.5(L) Platelets 150 - 400 K/uL [phone]      Patient Active Problem List   Diagnosis Date Noted  . Bacterial vaginitis 01/04/2020  . Acute cystitis without hematuria   . Dysuria 12/31/2019  . Urine leukocytes   . Sickle cell pain crisis ([place]) 09/10/2019  . Sickle cell disease ([place]) 12/04/2018  . Alpha  thalassemia silent carrier 12/04/2018  . Chronic prescription opiate use 03/13/2018  . Chronic musculoskeletal pain 03/13/2018  . Vitamin D deficiency 01/13/2018  . Leukocytosis 08/21/2017  . 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 Hb-SS disease without crisis ([place]) 08/15/2016  . Anemia of chronic disease   . Systolic ejection murmur [id number]  . Sickle cell anemia of mother during pregnancy [facility]) 05/16/2014    Past Surgical History:  Procedure Laterality Date  . CESAREAN SECTION N/A 02/05/2019   Procedure: CESAREAN SECTION;  Surgeon: [name], MD;  Location: [facility] LD ORS;  Service: Obstetrics;  Laterality: N/A;  . CHOLECYSTECTOMY N/A 11/30/2014   Procedure: LAPAROSCOPIC CHOLECYSTECTOMY SINGLE SITE WITH INTRAOPERATIVE CHOLANGIOGRAM;  Surgeon: Michael Boston, MD;  Location: [facility] ORS;  Service: General;  Laterality: N/A;  . SPLENECTOMY       OB History    Gravida  2   Para  1   Term  1   Preterm      AB  1   Living  1     SAB      TAB  1   Ectopic      Multiple  0   Live Births  1           Family History  Problem Relation Age of Onset  . Hypertension Mother   . Sickle cell anemia Sister   .  Kidney disease Sister        Lupus  . Arthritis Sister   . Sickle cell anemia Sister   . Sickle cell trait Sister   . Heart disease Maternal Aunt        CABG  . Heart disease Maternal Uncle        CABG  . Lupus Sister     Social History   Tobacco Use  . Smoking status: Current Some Day Smoker    Types: Cigarettes  . Smokeless tobacco: Never Used  Substance Use Topics  . Alcohol use: No  . Drug use: No    Home Medications Prior to Admission medications   Medication Sig Start Date End Date Taking? Authorizing Provider  acetaminophen (TYLENOL) 500 MG tablet Take 2 tablets (1,000 mg total) by mouth 2 (two) times daily as needed. 12/04/19   Azzie Glatter, FNP  aspirin EC 81 MG tablet Take 1 tablet (81 mg total) by mouth daily. 03/15/19    Lanae Boast, FNP  citalopram (CELEXA) 10 MG tablet Take 1 tablet (10 mg total) by mouth daily. 03/28/20 06/26/20  Vevelyn Francois, NP  cyclobenzaprine (FLEXERIL) 10 MG tablet Take 1 tablet (10 mg total) by mouth 2 (two) times daily. 03/28/20 06/26/20  Vevelyn Francois, NP  folic acid (FOLVITE) 1 MG tablet Take 1 tablet (1 mg total) by mouth daily. 03/15/19   Lanae Boast, FNP  ibuprofen (ADVIL) 800 MG tablet Take 1 tablet (800 mg total) by mouth 3 (three) times daily with meals as needed. Patient not taking: Reported on 02/02/2020 03/15/19   Lanae Boast, FNP  loratadine (CLARITIN) 10 MG tablet Take 1 tablet (10 mg total) by mouth daily. 03/28/20   Vevelyn Francois, NP  morphine (MS CONTIN) 60 MG 12 hr tablet Take 1 tablet (60 mg total) by mouth every 12 (twelve) hours. 04/02/20 05/02/20  Vevelyn Francois, NP  Oxycodone HCl 10 MG TABS Take 1 tablet (10 mg total) by mouth every 6 (six) hours as needed for up to 15 days (PAIN). 04/05/20 04/20/20  Vevelyn Francois, NP  Prenatal Vit-Fe Fumarate-FA (PRENATAL VITAMIN PLUS LOW IRON) 27-1 MG TABS TAKE 1 TABLET BY MOUTH EVERY DAY 03/03/20   Shelly Bombard, MD  Vitamin D, Ergocalciferol, (DRISDOL) 1.25 MG (50000 UT) CAPS capsule TAKE ONE CAPSULE BY MOUTH ONE TIME PER WEEK Patient taking differently: Take 50,000 Units by mouth every 7 (seven) days.  10/11/19   Azzie Glatter, FNP    Allergies    Patient has no known allergies.  Review of Systems   Review of Systems  Respiratory: Positive for cough.   All other systems reviewed and are negative.   Physical Exam Updated Vital Signs BP 133/85   Pulse (!) 119   Temp 98.6 F (37 C)   Resp 19   LMP 03/13/2020   SpO2 99%   Physical Exam Vitals and nursing note reviewed.  Constitutional:      General: She is not in acute distress.    Appearance: She is well-developed.  HENT:     Head: Atraumatic.     Mouth/Throat:     Mouth: Mucous membranes are moist.     Pharynx: No oropharyngeal exudate or  posterior oropharyngeal erythema.  Eyes:     Conjunctiva/sclera: Conjunctivae normal.  Cardiovascular:     Rate and Rhythm: Tachycardia present.     Pulses: Normal pulses.     Heart sounds: Normal heart sounds.  Pulmonary:  Effort: Pulmonary effort is normal.     Breath sounds: Normal breath sounds. No wheezing, rhonchi or rales.  Abdominal:     Palpations: Abdomen is soft.     Tenderness: There is no abdominal tenderness.  Musculoskeletal:        General: No tenderness.     Cervical back: Neck supple.  Skin:    Findings: No rash.  Neurological:     Mental Status: She is alert and oriented to person, place, and time.  Psychiatric:        Mood and Affect: Mood normal.     ED Results / Procedures / Treatments   Labs (all labs ordered are listed, but only abnormal results are displayed) Labs Reviewed  COMPREHENSIVE METABOLIC PANEL - Abnormal; Notable for the following components:      Result Value   Total Protein 8.6 (*)    Total Bilirubin 1.5 (*)    All other components within normal limits  CBC WITH DIFFERENTIAL/PLATELET - Abnormal; Notable for the following components:   RBC 3.63 (*)    Hemoglobin 10.7 (*)    HCT 30.1 (*)    Platelets 568 (*)    nRBC 0.4 (*)    Monocytes Absolute 1.2 (*)    All other components within normal limits  RETICULOCYTES - Abnormal; Notable for the following components:   Retic Ct Pct 5.7 (*)    RBC. 3.59 (*)    Retic Count, Absolute 204.3 (*)    Immature Retic Fract 31.1 (*)    All other components within normal limits  SARS CORONAVIRUS 2 BY RT PCR (HOSPITAL ORDER, [facility])  I-STAT BETA HCG BLOOD, ED ([facility], [facility], [facility] ONLY)    EKG None  Radiology DG Chest 2 View  Result Date: 03/31/2020 CLINICAL DATA:  Productive cough. EXAM: CHEST - 2 VIEW COMPARISON:  09/09/2019 FINDINGS: The heart size and mediastinal contours are within normal limits. Both lungs are clear except for minimal peribronchial thickening on  the right. The visualized skeletal structures are unremarkable. IMPRESSION: Minimal bronchitic changes. Electronically Signed   By: Lorriane Shire M.D.   On: 03/31/2020 15:45    Procedures Procedures (including critical care time)  Medications Ordered in ED Medications  0.45 % sodium chloride infusion ( Intravenous New Bag/Given 03/31/20 1707)  ondansetron (ZOFRAN) injection 4 mg (has no administration in time range)  sodium chloride flush (NS) 0.9 % injection 3 mL (3 mLs Intravenous Given 03/31/20 1700)  ketorolac (TORADOL) 15 MG/ML injection 15 mg (15 mg Intravenous Given 03/31/20 1704)  HYDROmorphone (DILAUDID) injection 2 mg (2 mg Intravenous Given 03/31/20 1704)  HYDROmorphone (DILAUDID) injection 2 mg (2 mg Intravenous Given 03/31/20 1733)  diphenhydrAMINE (BENADRYL) injection 25 mg (25 mg Intravenous Given 03/31/20 1704)  benzonatate (TESSALON) capsule 100 mg (100 mg Oral Given 03/31/20 1922)    ED Course  I have reviewed the triage vital signs and the nursing notes.  Pertinent labs & imaging results that were available during my care of the patient were reviewed by me and considered in my medical decision making (see chart for details).    MDM Rules/Calculators/A&P                      BP 115/77   Pulse 71   Temp 99.1 F (37.3 C) (Oral)   Resp 16   Ht 5\' 3"  (1.6 m)   Wt 81.6 kg   LMP 03/13/2020   SpO2 95%   BMI  31.89 kg/m   Final Clinical Impression(s) / ED Diagnoses Final diagnoses:  Sickle cell pain crisis ([place])    Rx / DC Orders ED Discharge Orders    None     4:55 PM Patient with complaints of body aches, cough, throat irritation as well as sickle cell type of pain.  With her cold symptoms, will obtain COVID-19 testing.  Chest x-ray showing mild bronchitic changes.  She denies any shortness of breath.  No wheezing on exam.  She is tachycardic but afebrile, blood pressure stable.  Will provide symptomatic treatment and will monitor closely.  7:53 PM COVID-19  test is negative.  Labs otherwise reassuring.  Patient received appropriate pain medications for a total of 3 doses with minimal improvement of her symptoms.  Will consult for admission. Doubt acute chest syndrome.    8:07 PM Appreciate consultation from [facility] Dr. Roel Cluck who agrees to see and admit pt for management of sickle cell pain.    RENEKA HENDRIX was evaluated in Emergency Department on 03/31/2020 for the symptoms described in the history of present illness. She was evaluated in the context of the global COVID-19 pandemic, which necessitated consideration that the patient might be at risk for infection with the SARS-CoV-2 virus that causes COVID-19. Institutional protocols and algorithms that pertain to the evaluation of patients at risk for COVID-19 are in a state of rapid change based on information released by regulatory bodies including the CDC and federal and state organizations. These policies and algorithms were followed during the patient's care in the ED.    Domenic Moras, PA-C 03/31/20 Glean Hess, MD 04/01/20 [phone]

## 2020-03-31 NOTE — ED Triage Notes (Signed)
Pt reports having sickle cell pains, cough and congestion for couple days.

## 2020-03-31 NOTE — H&P (Signed)
Carmen [name] [id number] DOB: 1991/07/13 DOA: 03/31/2020     PCP: Vevelyn Francois, NP  [facility] Outpatient Specialists:    Patient arrived to ER on 03/31/20 at 1513  Patient coming from: home Lives  With family    Chief Complaint:   Chief Complaint  Patient presents with  . Sickle Cell Pain Crisis  . Cough    HPI: Carmen [name] is a 29 y.o. female with medical history significant of depression and anxiety bipolar schizophrenia, sickle cell disease type SS     Presented with   URI symptoms and Sickle cell crisis 2 days of leg pain, back pain, no chest pain or shortness of breath. Reports the weather gets it worse  Does not smoke Ms contin 60 bid Oxycodone 10mg  q 6h Denies running out she tried to use medicine  Has been off hydroxyurea Per Dr. Benita Stabile   Infectious risk factors:  Reports none    Has  been vaccinated against COVID 2 weeks ago [employer]fizer  In  [place]     Lab Results  Component Value Date   [place] 03/31/2020   [place] NEGATIVE 01/01/2020   [place] NEGATIVE 05/17/2019     Regarding pertinent Chronic problems:     While in ER: retic count 2.0   Hospitalist was called for admission for sickle cell pain crisis  The following Work up has been ordered so far:  Orders Placed This Encounter  Procedures  . SARS Coronavirus 2 by RT PCR (hospital order, performed in [facility] hospital lab) Nasopharyngeal Nasopharyngeal Swab  . DG Chest 2 View  . Comprehensive metabolic panel  . CBC with Differential  . Reticulocytes  . Monitor O2 SATs  . Document Actual / Estimated Weight  . Saline Lock IV, Maintain IV access  . If O2 Sat <94% administer O2 at 2 liters/minute via nasal cannula  . Vital signs with O2 sat, q1hour  . Cardiac monitoring  . Weigh patient in Kg  . Patient may eat/drink  . Initiate Carrier Fluid Protocol  . Consult to hospitalist  ALL PATIENTS BEING ADMITTED/HAVING PROCEDURES  NEED COVID-19 SCREENING  . I-Stat beta hCG blood, ED     Following Medications were ordered in ER: Medications  0.45 % sodium chloride infusion ( Intravenous New Bag/Given 03/31/20 1707)  ondansetron (ZOFRAN) injection 4 mg (has no administration in time range)  sodium chloride flush (NS) 0.9 % injection 3 mL (3 mLs Intravenous Given 03/31/20 1700)  ketorolac (TORADOL) 15 MG/ML injection 15 mg (15 mg Intravenous Given 03/31/20 1704)  HYDROmorphone (DILAUDID) injection 2 mg (2 mg Intravenous Given 03/31/20 1704)  HYDROmorphone (DILAUDID) injection 2 mg (2 mg Intravenous Given 03/31/20 1733)  diphenhydrAMINE (BENADRYL) injection 25 mg (25 mg Intravenous Given 03/31/20 1704)  benzonatate (TESSALON) capsule 100 mg (100 mg Oral Given 03/31/20 1922)        Consult Orders  (From admission, onward)         Start     Ordered   03/31/20 1953  Consult to hospitalist  ALL PATIENTS BEING ADMITTED/HAVING PROCEDURES NEED COVID-19 SCREENING  Once    Comments: ALL PATIENTS BEING ADMITTED/HAVING PROCEDURES NEED COVID-19 SCREENING  Provider:  (Not yet assigned)  Question Answer Comment  Place call to: [facility]   Reason for Consult Admit      03/31/20 1952          Significant initial  Findings: Abnormal Labs Reviewed  COMPREHENSIVE  METABOLIC PANEL - Abnormal; Notable for the following components:      Result Value   Total Protein 8.6 (*)    Total Bilirubin 1.5 (*)    All other components within normal limits  CBC WITH DIFFERENTIAL/PLATELET - Abnormal; Notable for the following components:   RBC 3.63 (*)    Hemoglobin 10.7 (*)    HCT 30.1 (*)    Platelets 568 (*)    nRBC 0.4 (*)    Monocytes Absolute 1.2 (*)    All other components within normal limits  RETICULOCYTES - Abnormal; Notable for the following components:   Retic Ct Pct 5.7 (*)    RBC. 3.59 (*)    Retic Count, Absolute 204.3 (*)    Immature Retic Fract 31.1 (*)    All other components within normal limits       Otherwise labs showing:    Recent Labs  Lab 03/28/20 1542 03/31/20 1644  NA 135 139  K 4.4 3.7  CO2 22 23  GLUCOSE 78 93  BUN 9 8  CREATININE 0.60 0.54  CALCIUM 9.5 9.4    Cr   stable,    Lab Results  Component Value Date   CREATININE 0.54 03/31/2020   CREATININE 0.60 03/28/2020   CREATININE 0.44 02/02/2020    Recent Labs  Lab 03/28/20 1542 03/31/20 1644  AST 19 26  ALT 14 21  ALKPHOS 73 67  BILITOT 1.0 1.5*  PROT 7.6 8.6*  ALBUMIN 4.2 4.7   Lab Results  Component Value Date   CALCIUM 9.4 03/31/2020   PHOS 4.5 11/11/2015     WBC      Component Value Date/Time   WBC 10.3 03/31/2020 1644   ANC    Component Value Date/Time   NEUTROABS 7.1 03/31/2020 1644   NEUTROABS 5.6 03/28/2020 1542   ALC No components found for: LYMPHAB    Plt: Lab Results  Component Value Date   PLT 568 (H) 03/31/2020    Lactic Acid, Venous    Component Value Date/Time   LATICACIDVEN 1.72 06/27/2017 0408      Lab Results  Component Value Date   SARSCOV2NAA NEGATIVE 03/31/2020   SARSCOV2NAA NEGATIVE 01/01/2020   [place] NEGATIVE 05/17/2019    HG/HCT  Stable,     Component Value Date/Time   HGB 10.7 (L) 03/31/2020 1644   HGB 9.8 (L) 03/28/2020 1542   HCT 30.1 (L) 03/31/2020 1644   HCT 29.8 (L) 03/28/2020 1542       ECG: not  Ordered   UA not ordered   Urine analysis:    Component Value Date/Time   COLORURINE YELLOW 12/31/2019 1213   APPEARANCEUR CLEAR 12/31/2019 1213   LABSPEC 1.011 12/31/2019 1213   PHURINE 6.0 12/31/2019 1213   GLUCOSEU NEGATIVE 12/31/2019 1213   [place] 12/31/2019 1213   [place] 12/31/2019 1213   BILIRUBINUR Negative 08/10/2019 1534   KETONESUR NEGATIVE 12/31/2019 1213   PROTEINUR NEGATIVE 12/31/2019 1213   UROBILINOGEN 1.0 08/10/2019 1534   UROBILINOGEN 1.0 01/12/2018 0943   NITRITE NEGATIVE 12/31/2019 1213   LEUKOCYTESUR SMALL (A) 12/31/2019 1213      Ordered  CXR - minimal changes    ED Triage  Vitals  Enc Vitals Group     BP 03/31/20 1529 133/85     Pulse Rate 03/31/20 1529 (!) 119     Resp 03/31/20 1529 19     Temp 03/31/20 1529 98.6 F (37 C)     Temp Source 03/31/20 1658 Oral  SpO2 03/31/20 1529 99 %     Weight 03/31/20 1709 180 lb (81.6 kg)     Height 03/31/20 1709 5\' 3"  (1.6 m)     Head Circumference --      Peak Flow --      Pain Score 03/31/20 1530 9     Pain Loc --      Pain Edu? --      Excl. in [place]? --   TMAX(24)@      Latest  Blood pressure 115/77, pulse 71, temperature 99.1 F (37.3 C), temperature source Oral, resp. rate 16, height 5\' 3"  (1.6 m), weight 81.6 kg, last menstrual period 03/13/2020, SpO2 95 %, not currently breastfeeding.    Review of Systems:    Pertinent positives include: body aches, leg pain  Constitutional:  No weight loss, night sweats, Fevers, chills, fatigue, weight loss  HEENT:  No headaches, Difficulty swallowing,Tooth/dental problems,Sore throat,  No sneezing, itching, ear ache, nasal congestion, post nasal drip,  Cardio-vascular:  No chest pain, Orthopnea, PND, anasarca, dizziness, palpitations.no Bilateral lower extremity swelling  GI:  No heartburn, indigestion, abdominal pain, nausea, vomiting, diarrhea, change in bowel habits, loss of appetite, melena, blood in stool, hematemesis Resp:  no shortness of breath at rest. No dyspnea on exertion, No excess mucus, no productive cough, No non-productive cough, No coughing up of blood.No change in color of mucus.No wheezing. Skin:  no rash or lesions. No jaundice GU:  no dysuria, change in color of urine, no urgency or frequency. No straining to urinate.  No flank pain.  Musculoskeletal:  No joint pain or no joint swelling. No decreased range of motion. No back pain.  Psych:  No change in mood or affect. No depression or anxiety. No memory loss.  Neuro: no localizing neurological complaints, no tingling, no weakness, no double vision, no gait abnormality, no slurred speech,  no confusion  All systems reviewed and apart from [place] all are negative  Past Medical History:   Past Medical History:  Diagnosis Date  . Headache(784.0)   . Sickle cell crisis ([place])   . Syphilis 2015   Was diagnosed and received one injection of antibiotics  . Thrombocytosis ([place]) 11/22/2014     CBC Latest Ref Rng & Units 12/13/2018 12/11/2018 12/10/2018 WBC 4.0 - 10.5 K/uL 14.8(H) 13.2(H) 15.9(H) Hemoglobin 12.0 - 15.0 g/dL 8.2(L) 7.7(L) 8.1(L) Hematocrit 36.0 - 46.0 % 23.7(L) 23.2(L) 24.5(L) Platelets 150 - 400 K/uL 326 399 388        Past Surgical History:  Procedure Laterality Date  . CESAREAN SECTION N/A 02/05/2019   Procedure: CESAREAN SECTION;  Surgeon: [name], MD;  Location: [facility] LD ORS;  Service: Obstetrics;  Laterality: N/A;  . CHOLECYSTECTOMY N/A 11/30/2014   Procedure: LAPAROSCOPIC CHOLECYSTECTOMY SINGLE SITE WITH INTRAOPERATIVE CHOLANGIOGRAM;  Surgeon: Michael Boston, MD;  Location: [facility] ORS;  Service: General;  Laterality: N/A;  . SPLENECTOMY      Social History:  Ambulatory  independently       reports that she has been smoking cigarettes. She has never used smokeless tobacco. She reports that she does not drink alcohol or use drugs.   Family History:   Family History  Problem Relation Age of Onset  . Hypertension Mother   . Sickle cell anemia Sister   . Kidney disease Sister        Lupus  . Arthritis Sister   . Sickle cell anemia Sister   . Sickle cell trait Sister   . Heart disease  Maternal Aunt        CABG  . Heart disease Maternal Uncle        CABG  . Lupus Sister     Allergies: No Known Allergies   Prior to Admission medications   Medication Sig Start Date End Date Taking? Authorizing Provider  aspirin EC 81 MG tablet Take 1 tablet (81 mg total) by mouth daily. 03/15/19  Yes Lanae Boast, FNP  citalopram (CELEXA) 10 MG tablet Take 1 tablet (10 mg total) by mouth daily. 03/28/20 06/26/20 Yes Vevelyn Francois, NP  cyclobenzaprine (FLEXERIL) 10  MG tablet Take 1 tablet (10 mg total) by mouth 2 (two) times daily. 03/28/20 06/26/20 Yes Vevelyn Francois, NP  folic acid (FOLVITE) 1 MG tablet Take 1 tablet (1 mg total) by mouth daily. 03/15/19  Yes Lanae Boast, FNP  loratadine (CLARITIN) 10 MG tablet Take 1 tablet (10 mg total) by mouth daily. 03/28/20  Yes Vevelyn Francois, NP  morphine (MS CONTIN) 60 MG 12 hr tablet Take 1 tablet (60 mg total) by mouth every 12 (twelve) hours. 04/02/20 05/02/20 Yes Vevelyn Francois, NP  Oxycodone HCl 10 MG TABS Take 1 tablet (10 mg total) by mouth every 6 (six) hours as needed for up to 15 days (PAIN). Patient taking differently: Take 10 mg by mouth every 6 (six) hours as needed (pain).  04/05/20 04/20/20 Yes Vevelyn Francois, NP  Prenatal Vit-Fe Fumarate-FA (PRENATAL VITAMIN PLUS LOW IRON) 27-1 MG TABS TAKE 1 TABLET BY MOUTH EVERY DAY Patient taking differently: Take 1 tablet by mouth daily.  03/03/20  Yes Shelly Bombard, MD  Vitamin D, Ergocalciferol, (DRISDOL) 1.25 MG (50000 UT) CAPS capsule TAKE ONE CAPSULE BY MOUTH ONE TIME PER WEEK Patient taking differently: Take 50,000 Units by mouth every 7 (seven) days.  10/11/19  Yes Azzie Glatter, FNP  acetaminophen (TYLENOL) 500 MG tablet Take 2 tablets (1,000 mg total) by mouth 2 (two) times daily as needed. Patient not taking: Reported on 03/31/2020 12/04/19   Azzie Glatter, FNP  ibuprofen (ADVIL) 800 MG tablet Take 1 tablet (800 mg total) by mouth 3 (three) times daily with meals as needed. Patient not taking: Reported on 02/02/2020 03/15/19   Lanae Boast, FNP   Physical Exam: Blood pressure 115/77, pulse 71, temperature 99.1 F (37.3 C), temperature source Oral, resp. rate 16, height 5\' 3"  (1.6 m), weight 81.6 kg, last menstrual period 03/13/2020, SpO2 95 %, not currently breastfeeding. 1. General:  in No Acute distress     Chronically ill -appearing 2. Psychological: Alert and Oriented 3. Head/ENT:    Dry Mucous Membranes                          Head Non  traumatic, neck supple                            Poor Dentition 4. SKIN:  decreased Skin turgor,  Skin clean Dry and intact no rash 5. Heart: Regular rate and rhythm no  Murmur, no Rub or gallop 6. Lungs:   no wheezes or crackles   7. Abdomen: Soft,  non-tender, Non distended   obese  bowel sounds present 8. Lower extremities: no clubbing, cyanosis, no edema 9. Neurologically Grossly intact, moving all 4 extremities equally  10. MSK: Normal range of motion   All other LABS:     Recent Labs  Lab 03/28/20  1542 03/31/20 1644  WBC 11.5* 10.3  NEUTROABS 5.6 7.1  HGB 9.8* 10.7*  HCT 29.8* 30.1*  MCV 89 82.9  PLT 516* 568*     Recent Labs  Lab 03/28/20 1542 03/31/20 1644  NA 135 139  K 4.4 3.7  CL 100 106  CO2 22 23  GLUCOSE 78 93  BUN 9 8  CREATININE 0.60 0.54  CALCIUM 9.5 9.4     Recent Labs  Lab 03/28/20 1542 03/31/20 1644  AST 19 26  ALT 14 21  ALKPHOS 73 67  BILITOT 1.0 1.5*  PROT 7.6 8.6*  ALBUMIN 4.2 4.7    Cultures:    Component Value Date/Time   SDES  12/31/2019 1213    URINE, RANDOM Performed at [facility], [place]  [address] [place], [place] [address]    [facility] 12/31/2019 1213   CULT MULTIPLE SPECIES PRESENT, SUGGEST RECOLLECTION (A) 12/31/2019 1213   REPTSTATUS 01/01/2020 FINAL 12/31/2019 1213     Radiological Exams on Admission: DG Chest 2 View  Result Date: 03/31/2020 CLINICAL DATA:  Productive cough. EXAM: CHEST - 2 VIEW COMPARISON:  09/09/2019 FINDINGS: The heart size and mediastinal contours are within normal limits. Both lungs are clear except for minimal peribronchial thickening on the right. The visualized skeletal structures are unremarkable. IMPRESSION: Minimal bronchitic changes. Electronically Signed   By: Lorriane Shire M.D.   On: 03/31/2020 15:45    Chart has been reviewed    Assessment/Plan  29 y.o. female with medical history significant of depression and anxiety bipolar schizophrenia, sickle  cell disease type SS      Admitted for sickle cell pain crisis  Present on Admission: . Sickle cell crisis ([place]) - - will admit per sickle cell protocol,    control pain,    hydrate with IVF D5 .45% Saline @ 100 mls/hour,    Weight based Dilaudid PCA for opioid tolerant patients.    continue  folic acid   Transfuse as needed if Hg drops significantly below baseline.    No evidence of acute chest at this time   Sickle cell team to take over management in AM  URI -Covid negative supportive management No evidence of pneumonia Other plan as per orders.  DVT prophylaxis:   Lovenox     Code Status:  FULL CODE as per patient  I had personally discussed CODE STATUS with patient    Family Communication:   Family not at  Bedside    Disposition Plan:      To home once workup is complete and patient is stable   Following barriers for discharge:                                                          Pain controlled with PO medications                                                           Will need to be able to tolerate PO  Consults called: Sickle cell team to assume care in a.m.  Admission status:  ED Disposition    ED Disposition Condition [facility] Area: [facility] [[id number]]  Level of Care: Med-Surg [[id number]]  May admit patient to [facility] or [facility] if equivalent level of care is available:: No  Covid Evaluation: Confirmed COVID Negative  [facility]: [id number]  Admitting Physician: Toy Baker [[id number]]  Attending Physician: Toy Baker [[id number]]  Estimated length of stay: past midnight tomorrow  Certification:: I certify this patient will need inpatient services for at least 2 midnights         inpatient     I Expect 2 midnight stay secondary to severity of patient's current illness need for inpatient interventions justified by the following:   Severe  lab/radiological/exam abnormalities including:   Sickle cell pain crisis and extensive comorbidities including:  Chronic pain    sickle cell disease  .    That are currently affecting medical management.   I expect  patient to be hospitalized for 2 midnights requiring inpatient medical care.  Patient is at high risk for adverse outcome (such as loss of life or disability) if not treated.  Indication for inpatient stay as follows:  ,  severe pain requiring acute inpatient management,     Need for  IV fluids,  IV pain medications, IV anticoagulation   Level of care       medical floor     Precautions: admitted as  Covid Negative    PPE: Used by the provider:   P100  eye Goggles,  Gloves     Binnie Droessler 03/31/2020, 10:48 PM    [facility]     after 2 AM please page floor coverage PA If 7AM-7PM, please contact the day team taking care of the patient using [email or web address]   Patient was evaluated in the context of the global COVID-19 pandemic, which necessitated consideration that the patient might be at risk for infection with the SARS-CoV-2 virus that causes COVID-19. Institutional protocols and algorithms that pertain to the evaluation of patients at risk for COVID-19 are in a state of rapid change based on information released by regulatory bodies including the CDC and federal and state organizations. These policies and algorithms were followed during the patient's care.

## 2020-04-01 ENCOUNTER — Encounter (HOSPITAL_COMMUNITY): Payer: Self-pay | Admitting: Internal Medicine

## 2020-04-01 ENCOUNTER — Telehealth: Payer: Self-pay | Admitting: Nurse Practitioner

## 2020-04-01 DIAGNOSIS — D5700 Hb-SS disease with crisis, unspecified: Principal | ICD-10-CM

## 2020-04-01 LAB — PHOSPHORUS: Phosphorus: 4.4 mg/dL (ref 2.5–4.6)

## 2020-04-01 LAB — MAGNESIUM: Magnesium: 2.2 mg/dL (ref 1.7–2.4)

## 2020-04-01 NOTE — Plan of Care (Signed)
  Problem: Sensory: Goal: Pain level will decrease with appropriate interventions Outcome: Progressing   Problem: Education: Goal: Awareness of infection prevention will improve Outcome: Progressing

## 2020-04-01 NOTE — Telephone Encounter (Signed)
Pt states the end date on the oxycodone 10mg  is incorrect

## 2020-04-01 NOTE — Progress Notes (Signed)
Subjective: Carmen [name], a 29 year old female with a medical history significant for sickle cell disease, chronic pain syndrome, opiate dependence and tolerance, and history of anemia of chronic disease was admitted for sickle cell pain crisis.  Patient continues to complain of significant pain. She says that pain intensity is 8/10 mainly to back and lower extremities. Patient is currently upset because home opiate medications were not sent to pharmacy by PCP. Patient is very belligerent at this point. Advised patient to contact PCP.   She denies headache, chest pain, shortness of breath, urinary symptoms, nausea, vomiting, or diarrhea.     Objective:  Vital signs in last 24 hours:  Vitals:   04/01/20 0846 04/01/20 0943 04/01/20 1133 04/01/20 1656  BP:  119/83    Pulse:  79    Resp: 16 15 12 17   Temp:  98.4 F (36.9 C)    TempSrc:  Oral    SpO2: 95%  95% 98%  Weight:      Height:        Intake/Output from previous day:   Intake/Output Summary (Last 24 hours) at 04/01/2020 1848 Last data filed at 04/01/2020 1536 Gross per 24 hour  Intake 1685.32 ml  Output --  Net 1685.32 ml    Physical Exam: General: Alert, awake, oriented x3, in no acute distress.  HEENT: [place]/AT PEERL, EOMI Neck: Trachea midline,  no masses, no thyromegal,y no JVD, no carotid bruit OROPHARYNX:  Moist, No exudate/ erythema/lesions.  Heart: Regular rate and rhythm, without murmurs, rubs, gallops, PMI non-displaced, no heaves or thrills on palpation.  Lungs: Clear to auscultation, no wheezing or rhonchi noted. No increased vocal fremitus resonant to percussion  Abdomen: Soft, nontender, nondistended, positive bowel sounds, no masses no hepatosplenomegaly noted..  Neuro: No focal neurological deficits noted cranial nerves II through XII grossly intact. DTRs 2+ bilaterally upper and lower extremities. Strength 5 out of 5 in bilateral upper and lower extremities. Musculoskeletal: No warm swelling or erythema around  joints, no spinal tenderness noted. Psychiatric: Patient alert and oriented x3, good insight and cognition, good recent to remote recall. Lymph node survey: No cervical axillary or inguinal lymphadenopathy noted.  Lab Results:  Basic Metabolic Panel:    Component Value Date/Time   NA 139 03/31/2020 1644   NA 135 03/28/2020 1542   K 3.7 03/31/2020 1644   CL 106 03/31/2020 1644   CO2 23 03/31/2020 1644   BUN 8 03/31/2020 1644   BUN 9 03/28/2020 1542   CREATININE 0.54 03/31/2020 1644   GLUCOSE 93 03/31/2020 1644   CALCIUM 9.4 03/31/2020 1644   CBC:    Component Value Date/Time   WBC 10.3 03/31/2020 1644   HGB 10.7 (L) 03/31/2020 1644   HGB 9.8 (L) 03/28/2020 1542   HCT 30.1 (L) 03/31/2020 1644   HCT 29.8 (L) 03/28/2020 1542   PLT 568 (H) 03/31/2020 1644   PLT 516 (H) 03/28/2020 1542   MCV 82.9 03/31/2020 1644   MCV 89 03/28/2020 1542   NEUTROABS 7.1 03/31/2020 1644   NEUTROABS 5.6 03/28/2020 1542   LYMPHSABS 1.7 03/31/2020 1644   LYMPHSABS 4.6 (H) 03/28/2020 1542   MONOABS 1.2 (H) 03/31/2020 1644   EOSABS 0.3 03/31/2020 1644   EOSABS 0.2 03/28/2020 1542   BASOSABS 0.1 03/31/2020 1644   BASOSABS 0.1 03/28/2020 1542    Recent Results (from the past 240 hour(s))  SARS Coronavirus 2 by RT PCR (hospital order, performed in [facility] hospital lab) Nasopharyngeal Nasopharyngeal Swab  Status: None   Collection Time: 03/31/20  5:05 PM   Specimen: Nasopharyngeal Swab  Result Value Ref Range Status   SARS Coronavirus 2 NEGATIVE NEGATIVE Final    Comment: (NOTE) SARS-CoV-2 target nucleic acids are NOT DETECTED. The SARS-CoV-2 RNA is generally detectable in upper and lower respiratory specimens during the acute phase of infection. The lowest concentration of SARS-CoV-2 viral copies this assay can detect is 250 copies / mL. A negative result does not preclude SARS-CoV-2 infection and should not be used as the sole basis for treatment or other patient management decisions.   A negative result may occur with improper specimen collection / handling, submission of specimen other than nasopharyngeal swab, presence of viral mutation(s) within the areas targeted by this assay, and inadequate number of viral copies (<250 copies / mL). A negative result must be combined with clinical observations, patient history, and epidemiological information. Fact Sheet for Patients:   StrictlyIdeas.no Fact Sheet for Healthcare Providers: BankingDealers.co.za This test is not yet approved or cleared  by the [place] FDA and has been authorized for detection and/or diagnosis of SARS-CoV-2 by FDA under an Emergency Use Authorization (EUA).  This EUA will remain in effect (meaning this test can be used) for the duration of the COVID-19 declaration under Section 564(b)(1) of the Act, 21 U.S.C. section 360bbb-3(b)(1), unless the authorization is terminated or revoked sooner. Performed at [facility], [place] [address] [place], [place] [address]     Studies/Results: DG Chest 2 View  Result Date: 03/31/2020 CLINICAL DATA:  Productive cough. EXAM: CHEST - 2 VIEW COMPARISON:  09/09/2019 FINDINGS: The heart size and mediastinal contours are within normal limits. Both lungs are clear except for minimal peribronchial thickening on the right. The visualized skeletal structures are unremarkable. IMPRESSION: Minimal bronchitic changes. Electronically Signed   By: Lorriane Shire M.D.   On: 03/31/2020 15:45    Medications: Scheduled Meds:  aspirin EC  81 mg Oral Daily   citalopram  10 mg Oral Daily   cyclobenzaprine  10 mg Oral BID   enoxaparin (LOVENOX) injection  40 mg Subcutaneous [id number]   folic acid  1 mg Oral Daily   HYDROmorphone   Intravenous Q4H   ketorolac  30 mg Intravenous Q6H   loratadine  10 mg Oral Daily   [START ON 04/02/2020] morphine  60 mg Oral Q12H   senna-docusate  1 tablet Oral BID    Continuous Infusions:  sodium chloride 100 mL/hr at 04/01/20 1131   dextrose 5 % and 0.45% NaCl Stopped (04/01/20 1100)   PRN Meds:.bisacodyl, hydrOXYzine, naloxone **AND** sodium chloride flush, ondansetron (ZOFRAN) IV, polyethylene glycol  Consultants:  None  Procedures:  None  Antibiotics:  None  Assessment/Plan: Principal Problem:   Sickle cell crisis (HCC) Active Problems:   Chest pain  Sickle cell disease with pain crisis:  Continue IV fluids 0.45% saline at 100 ml/hr Continue IV dilaudid PCA, no changes in settings Toradol 15 mg every 6 hours as needed for a total of 5 days.  Monitor vital signs closely, supplemental oxygen as needed, and reevaluate pain scale regularly   Chronic pain syndrome:  Continue home medications.   Sickle cell anemia: Hemoglobin is consistent with baseline. No clinical indication for blood transfusion at this time.   URI:  Continue supportive management. No evidence of acute chest syndrome or pneumonia. COVID-19 negative  Code Status: Full Code Family Communication: N/A Disposition Plan: Not yet ready for discharge  Jennetta Flood Al [place]  APRN, MSN,  FNP-C Patient [facility] [place], [place] [address] [phone]  If 5PM-7AM, please contact night-coverage.  04/01/2020, 6:48 PM  LOS: 1 day ;

## 2020-04-01 NOTE — Progress Notes (Signed)
Upon entering pt's room for hourly rounding, patient was angry and yelling, was angry with the provider that recently rounded based upon comments made. Asked for medication for anxiety and depression. Per her request I will notify the provider to make a request. Asked if there were medications that helped in the past. Pt anger continued to escalate and stated that I should know what she takes. Advised per her med list, Celexa was already administered and hydroxyzine can be administered for anxiety. Pt. Stated she doesn't know what that drug was, advised it can also be used for itching and anxiety. Stated again that this hospital is awful, the nursing staff is awful and we don't know anything.  Notified provider per pt. request. Administered hydroxyzine and asked if there was anything else she needed, pt. displayed increased anger, used profanity and told me to get out of her room. Requested a different nurse. Advised I would make the charge nurse aware. Discussed patient request with Charge Nurse. Will await guidance from charge nurse. Willene Hatchet, RN

## 2020-04-02 ENCOUNTER — Other Ambulatory Visit: Payer: Self-pay | Admitting: Nurse Practitioner

## 2020-04-02 DIAGNOSIS — Z79891 Long term (current) use of opiate analgesic: Secondary | ICD-10-CM

## 2020-04-02 DIAGNOSIS — D571 Sickle-cell disease without crisis: Secondary | ICD-10-CM

## 2020-04-02 LAB — CBC
HCT: 22.5 % — ABNORMAL LOW (ref 36.0–46.0)
Hemoglobin: 8.3 g/dL — ABNORMAL LOW (ref 12.0–15.0)
MCH: 29.7 pg (ref 26.0–34.0)
MCHC: 36.9 g/dL — ABNORMAL HIGH (ref 30.0–36.0)
MCV: 80.6 fL (ref 80.0–100.0)
Platelets: 458 10*3/uL — ABNORMAL HIGH (ref 150–400)
RBC: 2.79 MIL/uL — ABNORMAL LOW (ref 3.87–5.11)
RDW: 13.9 % (ref 11.5–15.5)
WBC: 9.5 10*3/uL (ref 4.0–10.5)
nRBC: 0.4 % — ABNORMAL HIGH (ref 0.0–0.2)

## 2020-04-02 NOTE — Progress Notes (Signed)
Patient left AMA. Charge Nurse Vergia Alcon. And Department Director, Maisie Fus went into the room to discuss the conditions of leaving AMA. Patient refused to sign the AMA form and that refusal was documented by Vergia Alcon., RN on the [facility] form itself. The patient's IV was removed by Vergia Alcon., RN. The patient left the floor. Carmen Hollis, NP was notified about the patient leaving AMA.

## 2020-04-02 NOTE — Telephone Encounter (Signed)
I am not sure what you are saying.  She gets a 15 days supply.  Thanks

## 2020-04-03 ENCOUNTER — Telehealth: Payer: Self-pay | Admitting: Nurse Practitioner

## 2020-04-03 LAB — CANNABINOID CONFIRMATION, UR
CANNABINOIDS: POSITIVE — AB
Carboxy THC GC/MS Conf: 236 ng/mL

## 2020-04-03 LAB — OPIATES CONFIRMATION, URINE
Codeine: NEGATIVE
Hydrocodone: NEGATIVE
Hydromorphone: NEGATIVE
Morphine Confirm: >3000 ng/mL
Morphine: POSITIVE — AB
Opiates: POSITIVE ng/mL — AB

## 2020-04-03 LAB — DRUG SCREEN 764883 11+OXYCO+ALC+CRT-BUND
Amphetamines, Urine: NEGATIVE ng/mL
BENZODIAZ UR QL: NEGATIVE ng/mL
Barbiturate: NEGATIVE ng/mL
Cocaine (Metabolite): NEGATIVE ng/mL
Creatinine: 35.7 mg/dL (ref 20.0–300.0)
Ethanol: NEGATIVE %
Meperidine: NEGATIVE ng/mL
Methadone Screen, Urine: NEGATIVE ng/mL
Phencyclidine: NEGATIVE ng/mL
Propoxyphene: NEGATIVE ng/mL
Tramadol: NEGATIVE ng/mL
pH, Urine: 7.3 (ref 4.5–8.9)

## 2020-04-03 LAB — OXYCODONE/OXYMORPHONE, CONFIRM
OXYCODONE/OXYMORPH: POSITIVE — AB
OXYCODONE: 818 ng/mL
OXYCODONE: POSITIVE — AB
OXYMORPHONE (GC/MS): 1355 ng/mL
OXYMORPHONE: POSITIVE — AB

## 2020-04-03 NOTE — Telephone Encounter (Signed)
Ok  If anything changes please let us know. Thanks

## 2020-04-03 NOTE — Telephone Encounter (Signed)
Is she referring to the chest xray?  It looks fine is she having cough or SOB?

## 2020-04-03 NOTE — Telephone Encounter (Signed)
Pt wants to make sure you look at the x-rays she received in the ER and what's the next step of action

## 2020-04-03 NOTE — Telephone Encounter (Signed)
Called and spoke w/ patient she said that it's x-ray from the ED. She is asked if had hx of bronchitis , which was no.However she wasn't told that she had it. She said that still coughing  with yellow mucus, no sob , and no fever.  Patient said that she not having  any other symptoms.

## 2020-04-07 ENCOUNTER — Ambulatory Visit: Payer: Self-pay | Admitting: Clinical

## 2020-04-09 NOTE — Discharge Summary (Signed)
Physician Discharge Summary  Carmen [name] ENI:[id number] DOB: 10/18/1991 DOA: 03/31/2020  PCP: Vevelyn Francois, NP  Admit date: 03/31/2020  Discharge date: 04/09/2020  Discharge Diagnoses:  Principal Problem:   Sickle cell crisis [facility]) Active Problems:   Chest pain   Discharge Condition: Stable  Disposition:  Left AGAINST MEDICAL ADVICE  Diet: Regular Wt Readings from Last 3 Encounters:  03/31/20 78.3 kg  03/28/20 81.6 kg  02/02/20 79.4 kg    History of present illness:  Carmen [name] is a 29 year old female with a medical history significant for sickle cell disease, chronic pain syndrome, opiate dependence and tolerance, history significant for depression, anxiety, and bipolar schizophrenia presented with upper respiratory symptoms and sickle cell crisis.  Patient reports 2 days of leg and back pain.  Patient denies chest pain, shortness of breath.  Patient reports that upper respiratory symptoms have gotten worse.  Patient is a non-smoker.  She has been taking MS Contin 60 mg twice daily and oxycodone 10 mg every 6 hours for pain control without success.  Patient denies running out of these medications.  She has been off of hydroxyurea.  Patient has completed vaccination against COVID-19 infections 2 days prior to presenting  ER course: WBCs 10.3, hemoglobin 10.7, and bilirubin is elevated at 1.5.  All other laboratory values unremarkable.  BP 133/85, pulse 119, temperature 98.6 F, oxygen saturation 99% on RA. Pain persists despite IV pain medications and IV fluids.  Patient admitted to [place] for management of pain crisis.  Hospital Course:  Patient was admitted for sickle cell pain crisis and managed appropriately with IVF, IV Dilaudid via PCA and IV Toradol, as well as other adjunct therapies per sickle cell pain management protocols.  Received call from RN stating that patient is very upset and is leaving [place].  She is refusing to sign any AMA forms.  Most  recent vital signs appear to be stable.  Patient left AGAINST MEDICAL ADVICE today in a hemodynamically stable condition.   Discharge Exam: Vitals:   04/02/20 0630 04/02/20 0750  BP: 134/76   Pulse: 88   Resp: 16 18  Temp: 98.2 F (36.8 C)   SpO2: 100% 97%   Vitals:   04/02/20 0003 04/02/20 0457 04/02/20 0630 04/02/20 0750  BP:   134/76   Pulse:   88   Resp: 13 16 16 18   Temp:   98.2 F (36.8 C)   TempSrc:   Oral   SpO2: 100% 100% 100% 97%  Weight:      Height:           Allergies as of 04/02/2020   No Known Allergies     Medication List    ASK your doctor about these medications   acetaminophen 500 MG tablet Commonly known as: TYLENOL Take 2 tablets (1,000 mg total) by mouth 2 (two) times daily as needed.   aspirin EC 81 MG tablet Take 1 tablet (81 mg total) by mouth daily.   citalopram 10 MG tablet Commonly known as: CeleXA Take 1 tablet (10 mg total) by mouth daily.   cyclobenzaprine 10 MG tablet Commonly known as: FLEXERIL Take 1 tablet (10 mg total) by mouth 2 (two) times daily.   folic acid 1 MG tablet Commonly known as: FOLVITE Take 1 tablet (1 mg total) by mouth daily.   ibuprofen 800 MG tablet Commonly known as: ADVIL Take 1 tablet (800 mg total) by mouth 3 (three) times daily with meals as needed.  loratadine 10 MG tablet Commonly known as: CLARITIN Take 1 tablet (10 mg total) by mouth daily.   morphine 60 MG 12 hr tablet Commonly known as: MS Contin Take 1 tablet (60 mg total) by mouth every 12 (twelve) hours.   Oxycodone HCl 10 MG Tabs Take 1 tablet (10 mg total) by mouth every 6 (six) hours as needed for up to 15 days (PAIN).   Prenatal Vitamin Plus Low Iron 27-1 MG Tabs TAKE 1 TABLET BY MOUTH EVERY DAY   Vitamin D (Ergocalciferol) 1.25 MG (50000 UNIT) Caps capsule Commonly known as: DRISDOL TAKE ONE CAPSULE BY MOUTH ONE TIME PER WEEK       The results of significant diagnostics from this hospitalization (including imaging,  microbiology, ancillary and laboratory) are listed below for reference.    Significant Diagnostic Studies: DG Chest 2 View  Result Date: 03/31/2020 CLINICAL DATA:  Productive cough. EXAM: CHEST - 2 VIEW COMPARISON:  09/09/2019 FINDINGS: The heart size and mediastinal contours are within normal limits. Both lungs are clear except for minimal peribronchial thickening on the right. The visualized skeletal structures are unremarkable. IMPRESSION: Minimal bronchitic changes. Electronically Signed   By: Lorriane Shire M.D.   On: 03/31/2020 15:45    Microbiology: Recent Results (from the past 240 hour(s))  SARS Coronavirus 2 by RT PCR (hospital order, performed in [facility] hospital lab) Nasopharyngeal Nasopharyngeal Swab     Status: None   Collection Time: 03/31/20  5:05 PM   Specimen: Nasopharyngeal Swab  Result Value Ref Range Status   SARS Coronavirus 2 NEGATIVE NEGATIVE Final    Comment: (NOTE) SARS-CoV-2 target nucleic acids are NOT DETECTED. The SARS-CoV-2 RNA is generally detectable in upper and lower respiratory specimens during the acute phase of infection. The lowest concentration of SARS-CoV-2 viral copies this assay can detect is 250 copies / mL. A negative result does not preclude SARS-CoV-2 infection and should not be used as the sole basis for treatment or other patient management decisions.  A negative result may occur with improper specimen collection / handling, submission of specimen other than nasopharyngeal swab, presence of viral mutation(s) within the areas targeted by this assay, and inadequate number of viral copies (<250 copies / mL). A negative result must be combined with clinical observations, patient history, and epidemiological information. Fact Sheet for Patients:   StrictlyIdeas.no Fact Sheet for Healthcare Providers: BankingDealers.co.za This test is not yet approved or cleared  by the [place] FDA  and has been authorized for detection and/or diagnosis of SARS-CoV-2 by FDA under an Emergency Use Authorization (EUA).  This EUA will remain in effect (meaning this test can be used) for the duration of the COVID-19 declaration under Section 564(b)(1) of the Act, 21 U.S.C. section 360bbb-3(b)(1), unless the authorization is terminated or revoked sooner. Performed at [facility], [place] [address] [place], [place] [address]      Labs: Basic Metabolic Panel: No results for input(s): NA, K, CL, CO2, GLUCOSE, BUN, CREATININE, CALCIUM, MG, PHOS in the last 168 hours. Liver Function Tests: No results for input(s): AST, ALT, ALKPHOS, BILITOT, PROT, ALBUMIN in the last 168 hours. No results for input(s): LIPASE, AMYLASE in the last 168 hours. No results for input(s): AMMONIA in the last 168 hours. CBC: No results for input(s): WBC, NEUTROABS, HGB, HCT, MCV, PLT in the last 168 hours. Cardiac Enzymes: No results for input(s): CKTOTAL, CKMB, CKMBINDEX, TROPONINI in the last 168 hours. BNP: Invalid input(s): POCBNP CBG: No results for input(s):  GLUCAP in the last 168 hours.  Time coordinating discharge: Patient left AGAINST MEDICAL ADVICE  Signed:  [name]  APRN, MSN, FNP-C [facility] [address] [place], [place] [address] [phone]  [facility] 04/09/2020, 3:28 PM

## 2020-04-21 ENCOUNTER — Other Ambulatory Visit: Payer: Self-pay | Admitting: Nurse Practitioner

## 2020-04-21 ENCOUNTER — Telehealth: Payer: Self-pay | Admitting: Nurse Practitioner

## 2020-04-21 DIAGNOSIS — Z79891 Long term (current) use of opiate analgesic: Secondary | ICD-10-CM

## 2020-04-21 DIAGNOSIS — D571 Sickle-cell disease without crisis: Secondary | ICD-10-CM

## 2020-04-21 MED ORDER — OXYCODONE HCL 10 MG PO TABS: 10.0000 mg | ORAL_TABLET | Freq: Four times a day (QID) | ORAL | 0 refills | Status: DC | PRN

## 2020-04-21 NOTE — Telephone Encounter (Signed)
Sent!

## 2020-04-28 ENCOUNTER — Encounter (HOSPITAL_COMMUNITY): Payer: Self-pay

## 2020-04-28 ENCOUNTER — Other Ambulatory Visit: Payer: Self-pay

## 2020-04-28 NOTE — ED Triage Notes (Signed)
Pt sts hurting for 2 days. Takes morphine and oxycodone at home but did not help with pain control.

## 2020-04-28 NOTE — ED Notes (Signed)
Pt requests to wait on bloodwork until in a room.

## 2020-04-29 ENCOUNTER — Inpatient Hospital Stay (HOSPITAL_COMMUNITY)
Admission: AD | Admit: 2020-04-29 | Discharge: 2020-05-02 | DRG: 812 | Disposition: A | Discharge status: Home / Self Care | Payer: Medicaid Other | Source: Ambulatory Visit | Attending: Internal Medicine | Admitting: Internal Medicine

## 2020-04-29 ENCOUNTER — Emergency Department (HOSPITAL_COMMUNITY)
Admission: EM | Admit: 2020-04-29 | Discharge: 2020-04-29 | Disposition: A | Discharge status: Home / Self Care | Payer: Medicaid Other | Source: Home / Self Care | Attending: Emergency Medicine | Admitting: Emergency Medicine

## 2020-04-29 ENCOUNTER — Other Ambulatory Visit: Payer: Self-pay | Admitting: Family Medicine

## 2020-04-29 ENCOUNTER — Encounter (HOSPITAL_COMMUNITY): Payer: Self-pay | Admitting: Family Medicine

## 2020-04-29 ENCOUNTER — Emergency Department (HOSPITAL_COMMUNITY): Payer: Medicaid Other

## 2020-04-29 DIAGNOSIS — Z8261 Family history of arthritis: Secondary | ICD-10-CM

## 2020-04-29 DIAGNOSIS — Z841 Family history of disorders of kidney and ureter: Secondary | ICD-10-CM

## 2020-04-29 DIAGNOSIS — E875 Hyperkalemia: Secondary | ICD-10-CM | POA: Diagnosis present

## 2020-04-29 DIAGNOSIS — M7918 Myalgia, other site: Secondary | ICD-10-CM | POA: Diagnosis present

## 2020-04-29 DIAGNOSIS — F17210 Nicotine dependence, cigarettes, uncomplicated: Secondary | ICD-10-CM | POA: Diagnosis present

## 2020-04-29 DIAGNOSIS — Z8249 Family history of ischemic heart disease and other diseases of the circulatory system: Secondary | ICD-10-CM

## 2020-04-29 DIAGNOSIS — Z79891 Long term (current) use of opiate analgesic: Secondary | ICD-10-CM

## 2020-04-29 DIAGNOSIS — D5700 Hb-SS disease with crisis, unspecified: Principal | ICD-10-CM | POA: Diagnosis present

## 2020-04-29 DIAGNOSIS — Z9081 Acquired absence of spleen: Secondary | ICD-10-CM

## 2020-04-29 DIAGNOSIS — Z20822 Contact with and (suspected) exposure to covid-19: Secondary | ICD-10-CM | POA: Diagnosis present

## 2020-04-29 DIAGNOSIS — G8929 Other chronic pain: Secondary | ICD-10-CM | POA: Diagnosis present

## 2020-04-29 DIAGNOSIS — Z832 Family history of diseases of the blood and blood-forming organs and certain disorders involving the immune mechanism: Secondary | ICD-10-CM

## 2020-04-29 DIAGNOSIS — D571 Sickle-cell disease without crisis: Secondary | ICD-10-CM

## 2020-04-29 DIAGNOSIS — G894 Chronic pain syndrome: Secondary | ICD-10-CM | POA: Diagnosis present

## 2020-04-29 LAB — COMPREHENSIVE METABOLIC PANEL
ALT: 31 U/L (ref 0–44)
AST: 68 U/L — ABNORMAL HIGH (ref 15–41)
Albumin: 4.4 g/dL (ref 3.5–5.0)
Alkaline Phosphatase: 61 U/L (ref 38–126)
Anion gap: 14 (ref 5–15)
BUN: 10 mg/dL (ref 6–20)
CO2: 20 mmol/L — ABNORMAL LOW (ref 22–32)
Calcium: 9.4 mg/dL (ref 8.9–10.3)
Chloride: 101 mmol/L (ref 98–111)
Creatinine, Ser: 0.74 mg/dL (ref 0.44–1.00)
GFR calc Af Amer: >60 mL/min (ref >60)
GFR calc non Af Amer: >60 mL/min (ref >60)
Glucose, Bld: 86 mg/dL (ref 70–99)
Potassium: 5.5 mmol/L — ABNORMAL HIGH (ref 3.5–5.1)
Sodium: 135 mmol/L (ref 135–145)
Total Bilirubin: 1.1 mg/dL (ref 0.3–1.2)
Total Protein: 8.6 g/dL — ABNORMAL HIGH (ref 6.5–8.1)

## 2020-04-29 LAB — CBC WITH DIFFERENTIAL/PLATELET
Abs Immature Granulocytes: 0.06 10*3/uL (ref 0.00–0.07)
Basophils Absolute: 0 10*3/uL (ref 0.0–0.1)
Basophils Relative: 0 %
Eosinophils Absolute: 0.1 10*3/uL (ref 0.0–0.5)
Eosinophils Relative: 1 %
HCT: 27.2 % — ABNORMAL LOW (ref 36.0–46.0)
Hemoglobin: 9.5 g/dL — ABNORMAL LOW (ref 12.0–15.0)
Immature Granulocytes: 1 %
Lymphocytes Relative: 23 %
Lymphs Abs: 2.5 10*3/uL (ref 0.7–4.0)
MCH: 29.5 pg (ref 26.0–34.0)
MCHC: 34.9 g/dL (ref 30.0–36.0)
MCV: 84.5 fL (ref 80.0–100.0)
Monocytes Absolute: 1.2 10*3/uL — ABNORMAL HIGH (ref 0.1–1.0)
Monocytes Relative: 11 %
Neutro Abs: 7 10*3/uL (ref 1.7–7.7)
Neutrophils Relative %: 64 %
Platelets: 572 10*3/uL — ABNORMAL HIGH (ref 150–400)
RBC: 3.22 MIL/uL — ABNORMAL LOW (ref 3.87–5.11)
RDW: 14.8 % (ref 11.5–15.5)
WBC: 10.9 10*3/uL — ABNORMAL HIGH (ref 4.0–10.5)
nRBC: 0.8 % — ABNORMAL HIGH (ref 0.0–0.2)

## 2020-04-29 LAB — RETICULOCYTES
Immature Retic Fract: 32.4 % — ABNORMAL HIGH (ref 2.3–15.9)
RBC.: 3.21 MIL/uL — ABNORMAL LOW (ref 3.87–5.11)
Retic Count, Absolute: 200.6 10*3/uL — ABNORMAL HIGH (ref 19.0–186.0)
Retic Ct Pct: 6.3 % — ABNORMAL HIGH (ref 0.4–3.1)

## 2020-04-29 LAB — HCG, QUANTITATIVE, PREGNANCY: hCG, Beta Chain, Quant, S: <1 m[IU]/mL (ref <5)

## 2020-04-29 MED ORDER — POLYETHYLENE GLYCOL 3350 17 G PO PACK: 17.0000 g | PACK | Freq: Every day | ORAL | Status: DC | PRN

## 2020-04-29 MED ORDER — SODIUM CHLORIDE 0.9% FLUSH: 9.0000 mL | INTRAVENOUS | Status: DC | PRN

## 2020-04-29 MED ORDER — SODIUM CHLORIDE 0.9 % IV SOLN
25.0000 mg | INTRAVENOUS | Status: DC | PRN
  Filled 2020-04-29: qty 0.5

## 2020-04-29 MED ORDER — CYCLOBENZAPRINE HCL 10 MG PO TABS
10.0000 mg | ORAL_TABLET | Freq: Two times a day (BID) | ORAL | Status: DC
  Administered 2020-04-29 – 2020-05-02 (×6): 10 mg via ORAL
  Filled 2020-04-29 (×6): qty 1

## 2020-04-29 MED ORDER — FOLIC ACID 1 MG PO TABS
1.0000 mg | ORAL_TABLET | Freq: Every day | ORAL | Status: DC
  Administered 2020-04-29 – 2020-05-02 (×4): 1 mg via ORAL
  Filled 2020-04-29 (×4): qty 1

## 2020-04-29 MED ORDER — ONDANSETRON HCL 4 MG/2ML IJ SOLN
4.0000 mg | Freq: Four times a day (QID) | INTRAMUSCULAR | Status: DC | PRN
  Administered 2020-04-29 – 2020-04-30 (×4): 4 mg via INTRAVENOUS
  Filled 2020-04-29 (×6): qty 2

## 2020-04-29 MED ORDER — HYDROMORPHONE 1 MG/ML IV SOLN
INTRAVENOUS | Status: DC
  Administered 2020-04-29: 2 mg via INTRAVENOUS
  Administered 2020-04-29: 2.5 mg via INTRAVENOUS
  Administered 2020-04-29: 11 mg via INTRAVENOUS
  Administered 2020-04-29 – 2020-04-30 (×2): 30 mg via INTRAVENOUS
  Administered 2020-04-30: 5.5 mg via INTRAVENOUS
  Administered 2020-04-30: 4.5 mg via INTRAVENOUS
  Administered 2020-04-30: 6 mg via INTRAVENOUS
  Administered 2020-04-30: 1.7 mg via INTRAVENOUS
  Administered 2020-04-30: 4 mg via INTRAVENOUS
  Administered 2020-04-30: 3.5 mg via INTRAVENOUS
  Administered 2020-04-30: 6 mg via INTRAVENOUS
  Administered 2020-05-01: 30 mg via INTRAVENOUS
  Administered 2020-05-01 (×2): 4.5 mg via INTRAVENOUS
  Administered 2020-05-01: 11.5 mg via INTRAVENOUS
  Filled 2020-04-29 (×3): qty 30

## 2020-04-29 MED ORDER — KETOROLAC TROMETHAMINE 15 MG/ML IJ SOLN
15.0000 mg | INTRAMUSCULAR | Status: AC
  Administered 2020-04-29: 15 mg via INTRAVENOUS
  Filled 2020-04-29: qty 1

## 2020-04-29 MED ORDER — NALOXONE HCL 0.4 MG/ML IJ SOLN: 0.4000 mg | INTRAMUSCULAR | Status: DC | PRN

## 2020-04-29 MED ORDER — HYDROMORPHONE HCL 2 MG/ML IJ SOLN
2.0000 mg | INTRAMUSCULAR | Status: AC
  Administered 2020-04-29: 2 mg via INTRAVENOUS
  Filled 2020-04-29: qty 1

## 2020-04-29 MED ORDER — OXYCODONE HCL 5 MG PO TABS: 10.0000 mg | ORAL_TABLET | Freq: Four times a day (QID) | ORAL | Status: DC | PRN

## 2020-04-29 MED ORDER — ENOXAPARIN SODIUM 40 MG/0.4ML ~~LOC~~ SOLN
40.0000 mg | SUBCUTANEOUS | Status: DC
  Filled 2020-04-29: qty 0.4

## 2020-04-29 MED ORDER — KETOROLAC TROMETHAMINE 15 MG/ML IJ SOLN
15.0000 mg | Freq: Four times a day (QID) | INTRAMUSCULAR | Status: DC
  Administered 2020-04-29 – 2020-05-02 (×10): 15 mg via INTRAVENOUS
  Filled 2020-04-29 (×11): qty 1

## 2020-04-29 MED ORDER — ASPIRIN EC 81 MG PO TBEC
81.0000 mg | DELAYED_RELEASE_TABLET | Freq: Every day | ORAL | Status: DC
  Administered 2020-04-29 – 2020-05-02 (×4): 81 mg via ORAL
  Filled 2020-04-29 (×4): qty 1

## 2020-04-29 MED ORDER — CITALOPRAM HYDROBROMIDE 20 MG PO TABS
10.0000 mg | ORAL_TABLET | Freq: Every day | ORAL | Status: DC
  Administered 2020-04-29 – 2020-05-02 (×4): 10 mg via ORAL
  Filled 2020-04-29 (×4): qty 1

## 2020-04-29 MED ORDER — MORPHINE SULFATE ER 30 MG PO TBCR
60.0000 mg | EXTENDED_RELEASE_TABLET | Freq: Two times a day (BID) | ORAL | Status: DC
  Administered 2020-04-29 – 2020-05-02 (×6): 60 mg via ORAL
  Filled 2020-04-29 (×6): qty 2

## 2020-04-29 MED ORDER — SENNOSIDES-DOCUSATE SODIUM 8.6-50 MG PO TABS
1.0000 | ORAL_TABLET | Freq: Two times a day (BID) | ORAL | Status: DC
  Administered 2020-04-29 – 2020-05-02 (×6): 1 via ORAL
  Filled 2020-04-29 (×6): qty 1

## 2020-04-29 MED ORDER — DIPHENHYDRAMINE HCL 25 MG PO CAPS
25.0000 mg | ORAL_CAPSULE | ORAL | Status: DC | PRN
  Administered 2020-04-29: 25 mg via ORAL
  Filled 2020-04-29: qty 1

## 2020-04-29 MED ORDER — SODIUM CHLORIDE 0.45 % IV SOLN: INTRAVENOUS | Status: DC

## 2020-04-29 NOTE — H&P (Signed)
H&P  Patient Demographics:  Carmen [name], is a 29 y.o. female  MRN: [id number]   DOB - 1991/04/13  Admit Date - 04/29/2020  Outpatient Primary MD for the patient is Vevelyn Francois, NP      HPI:    Carmen [name] is a 29 year old female with a medical history significant for sickle cell disease, chronic pain syndrome, opiate dependence and tolerance, and history of anemia presents complaining of generalized pain that is consistent with her previous sickle cell pain crisis.  Patient was treated and evaluated in the emergency department this a.m. for this problem without complete resolution.  Agree with ER provider that patient was appropriate to transition to sickle cell day infusion center for further management of pain and extended observation.  Patient's pain persists despite IV Dilaudid and IV fluids.  She says the pain intensity has been worsening over the past several days.  She has been taking oxycodone consistently without sustained relief.  She has not identified any aggravating factors concerning current crisis.  She denies fever, chills, shortness of breath, urinary symptoms, nausea, vomiting, or diarrhea patient endorses some mid chest pain.  She denies headache, dizziness, or paresthesias.  No sick contacts, recent travel, or exposure to COVID-19.   Sickle cell day infusion center course: Patient found to have vitals of BP 106/69 (BP Location: Right Arm)   Pulse 75   Temp 98.5 F (36.9 C) (Oral)   Resp 11   LMP 04/15/2020   SpO2 99% .  Review laboratory values, mostly consistent with patient's baseline.  Hemoglobin 9.5.  COVID-19 test pending.  Pain persists despite IV Dilaudid PCA and IV fluids, admit to MedSurg and observation for further management of pain crisis.   Review of systems:  In addition to the HPI above, patient reports Review of Systems  Constitutional: Negative.   Respiratory: Negative.  Negative for shortness of breath.   Cardiovascular: Positive for chest pain.   Gastrointestinal: Negative.   Genitourinary: Negative.   Musculoskeletal: Positive for back pain and joint pain.  Skin: Negative.   Neurological: Negative.   Endo/Heme/Allergies: Negative.   Psychiatric/Behavioral: Negative.     A full 10 point Review of Systems was done, except as stated above, all other Review of Systems were negative.  With Past History of the following :   Past Medical History:  Diagnosis Date  . Headache(784.0)   . Sickle cell crisis ([place])   . Syphilis 2015   Was diagnosed and received one injection of antibiotics  . Thrombocytosis ([place]) 11/22/2014     CBC Latest Ref Rng & Units 12/13/2018 12/11/2018 12/10/2018 WBC 4.0 - 10.5 K/uL 14.8(H) 13.2(H) 15.9(H) Hemoglobin 12.0 - 15.0 g/dL 8.2(L) 7.7(L) 8.1(L) Hematocrit 36.0 - 46.0 % 23.7(L) 23.2(L) 24.5(L) Platelets 150 - 400 K/uL 326 399 388        Past Surgical History:  Procedure Laterality Date  . CESAREAN SECTION N/A 02/05/2019   Procedure: CESAREAN SECTION;  Surgeon: [name], MD;  Location: [facility] LD ORS;  Service: Obstetrics;  Laterality: N/A;  . CHOLECYSTECTOMY N/A 11/30/2014   Procedure: LAPAROSCOPIC CHOLECYSTECTOMY SINGLE SITE WITH INTRAOPERATIVE CHOLANGIOGRAM;  Surgeon: Michael Boston, MD;  Location: [facility] ORS;  Service: General;  Laterality: N/A;  . SPLENECTOMY       Social History:   Social History   Tobacco Use  . Smoking status: Current Some Day Smoker    Types: Cigarettes  . Smokeless tobacco: Never Used  Substance Use Topics  . Alcohol use: No  Lives - At home   Family History :   Family History  Problem Relation Age of Onset  . Hypertension Mother   . Sickle cell anemia Sister   . Kidney disease Sister        Lupus  . Arthritis Sister   . Sickle cell anemia Sister   . Sickle cell trait Sister   . Heart disease Maternal Aunt        CABG  . Heart disease Maternal Uncle        CABG  . Lupus Sister      Home Medications:   Prior to Admission medications   Medication Sig  Start Date End Date Taking? Authorizing Provider  citalopram (CELEXA) 10 MG tablet Take 1 tablet (10 mg total) by mouth daily. 03/28/20 06/26/20 Yes Vevelyn Francois, NP  cyclobenzaprine (FLEXERIL) 10 MG tablet Take 1 tablet (10 mg total) by mouth 2 (two) times daily. 03/28/20 06/26/20 Yes Vevelyn Francois, NP  folic acid (FOLVITE) 1 MG tablet Take 1 tablet (1 mg total) by mouth daily. 03/15/19  Yes Lanae Boast, FNP  loratadine (CLARITIN) 10 MG tablet Take 1 tablet (10 mg total) by mouth daily. 03/28/20  Yes Vevelyn Francois, NP  morphine (MS CONTIN) 60 MG 12 hr tablet Take 1 tablet (60 mg total) by mouth every 12 (twelve) hours. 04/02/20 05/02/20 Yes Vevelyn Francois, NP  Oxycodone HCl 10 MG TABS Take 1 tablet (10 mg total) by mouth every 6 (six) hours as needed for up to 15 days (PAIN). 04/22/20 05/07/20 Yes Vevelyn Francois, NP  Prenatal Vit-Fe Fumarate-FA (PRENATAL VITAMIN PLUS LOW IRON) 27-1 MG TABS TAKE 1 TABLET BY MOUTH EVERY DAY Patient taking differently: Take 1 tablet by mouth daily.  03/03/20  Yes Shelly Bombard, MD  Vitamin D, Ergocalciferol, (DRISDOL) 1.25 MG (50000 UT) CAPS capsule TAKE ONE CAPSULE BY MOUTH ONE TIME PER WEEK Patient taking differently: Take 50,000 Units by mouth every 7 (seven) days.  10/11/19  Yes Azzie Glatter, FNP  acetaminophen (TYLENOL) 500 MG tablet Take 2 tablets (1,000 mg total) by mouth 2 (two) times daily as needed. Patient not taking: Reported on 03/31/2020 12/04/19   Azzie Glatter, FNP  aspirin EC 81 MG tablet Take 1 tablet (81 mg total) by mouth daily. Patient not taking: Reported on 04/29/2020 03/15/19   Lanae Boast, FNP  ibuprofen (ADVIL) 800 MG tablet Take 1 tablet (800 mg total) by mouth 3 (three) times daily with meals as needed. Patient not taking: Reported on 02/02/2020 03/15/19   Lanae Boast, FNP     Allergies:   No Known Allergies   Physical Exam:   Vitals:   Vitals:   04/29/20 0950 04/29/20 1205  BP: 127/79 106/69  Pulse: 65 75  Resp: 11 11   Temp: 98.5 F (36.9 C)   SpO2: 98% 99%    Physical Exam: Constitutional: Patient appears well-developed and well-nourished. Not in obvious distress. HENT: Normocephalic, atraumatic, External right and left ear normal. Oropharynx is clear and moist.  Eyes: Conjunctivae and EOM are normal. PERRLA, no scleral icterus. Neck: Normal ROM. Neck supple. No JVD. No tracheal deviation. No thyromegaly. CVS: RRR, S1/S2 +, no murmurs, no gallops, no carotid bruit.  Pulmonary: Effort and breath sounds normal, no stridor, rhonchi, wheezes, rales.  Abdominal: Soft. BS +, no distension, tenderness, rebound or guarding.  Musculoskeletal: Normal range of motion. No edema and no tenderness.  Lymphadenopathy: No lymphadenopathy noted, cervical, inguinal or axillary Neuro: Alert.  Normal reflexes, muscle tone coordination. No cranial nerve deficit. Skin: Skin is warm and dry. No rash noted. Not diaphoretic. No erythema. No pallor. Psychiatric: Normal mood and affect. Behavior, judgment, thought content normal.   Data Review:   CBC Recent Labs  Lab 04/29/20 0430  WBC 10.9*  HGB 9.5*  HCT 27.2*  PLT 572*  MCV 84.5  MCH 29.5  MCHC 34.9  RDW 14.8  LYMPHSABS 2.5  MONOABS 1.2*  EOSABS 0.1  BASOSABS 0.0   ------------------------------------------------------------------------------------------------------------------  Chemistries  Recent Labs  Lab 04/29/20 0430  NA 135  K 5.5*  CL 101  CO2 20*  GLUCOSE 86  BUN 10  CREATININE 0.74  CALCIUM 9.4  AST 68*  ALT 31  ALKPHOS 61  BILITOT 1.1   ------------------------------------------------------------------------------------------------------------------ estimated creatinine clearance is 106.9 mL/min (by C-G formula based on SCr of 0.74 mg/dL). ------------------------------------------------------------------------------------------------------------------ No results for input(s): TSH, T4TOTAL, T3FREE, THYROIDAB in the last 72  hours.  Invalid input(s): FREET3  Coagulation profile No results for input(s): INR, PROTIME in the last 168 hours. ------------------------------------------------------------------------------------------------------------------- No results for input(s): DDIMER in the last 72 hours. -------------------------------------------------------------------------------------------------------------------  Cardiac Enzymes No results for input(s): CKMB, TROPONINI, MYOGLOBIN in the last 168 hours.  Invalid input(s): CK ------------------------------------------------------------------------------------------------------------------ No results found for: BNP  ---------------------------------------------------------------------------------------------------------------  Urinalysis    Component Value Date/Time   COLORURINE YELLOW 12/31/2019 1213   APPEARANCEUR CLEAR 12/31/2019 1213   LABSPEC 1.011 12/31/2019 1213   PHURINE 6.0 12/31/2019 1213   GLUCOSEU NEGATIVE 12/31/2019 1213   HGBUR NEGATIVE 12/31/2019 1213   BILIRUBINUR NEGATIVE 12/31/2019 1213   BILIRUBINUR Negative 08/10/2019 1534   KETONESUR NEGATIVE 12/31/2019 1213   PROTEINUR NEGATIVE 12/31/2019 1213   UROBILINOGEN 1.0 08/10/2019 1534   UROBILINOGEN 1.0 01/12/2018 0943   NITRITE NEGATIVE 12/31/2019 1213   LEUKOCYTESUR SMALL (A) 12/31/2019 1213    ----------------------------------------------------------------------------------------------------------------   Imaging Results:    DG Chest Port 1 View  Result Date: 04/29/2020 CLINICAL DATA:  Sickle cell crisis with chest pain EXAM: PORTABLE CHEST 1 VIEW COMPARISON:  03/31/2020 FINDINGS: Chronic streaky opacity at the right base overlapping the diaphragm. There is no edema, consolidation, effusion, or pneumothorax. Normal heart size and mediastinal contours. IMPRESSION: Scarring at the right base. No acute finding when compared with prior. Electronically Signed   By: Monte Fantasia M.D.   On: 04/29/2020 04:52      Assessment & Plan:  Principal Problem:   Sickle cell pain crisis ([place]) Active Problems:   Chronic musculoskeletal pain  Sickle cell disease with pain crisis: Pain persists despite IV Dilaudid PCA.  Admit to MedSurg. Continue IV Dilaudid PCA with settings of 0.5 mg, 10-minute lockout, and 3 mg/h IV fluids, 0.45% saline at 100 mL/h Toradol 15 mg IV every 6 hours for total of 5 days Monitor vital signs closely, reevaluate pain scale regularly, and supplemental oxygen as needed. Pain will be reevaluated in context of function and relationship to baseline as patient's care progresses  Sickle cell anemia: Hemoglobin is 9.5, consistent with patient's baseline.  There is no clinical indication for blood transfusion at this time.  Continue to follow closely.  CBC in a.m. Continue folic acid 1 mg daily  Chronic pain syndrome: Continue MS Contin 30 mg every 12 hours and oxycodone 10 mg every 6 hours as needed for severe breakthrough pain   DVT Prophylaxis: Subcut Lovenox   AM Labs Ordered, also please review Full Orders  Family Communication: Admission, patient's condition and plan of care including tests being  ordered have been discussed with the patient who indicate understanding and agree with the plan and Code Status.  Code Status: Full Code  Consults called: None    Admission status: Inpatient    Time spent in minutes : 50 minutes    [place], MSN, FNP-C [facility] [address] [place], [place] [address] [phone]  04/29/2020 at 1:12 PM

## 2020-04-29 NOTE — Discharge Instructions (Signed)
Sickle Cell Anemia, Adult  Sickle cell anemia is a condition where your red blood cells are shaped like sickles. Red blood cells carry oxygen through the body. Sickle-shaped cells do not live as long as normal red blood cells. They also clump together and block blood from flowing through the blood vessels. This prevents the body from getting enough oxygen. Sickle cell anemia causes organ damage and pain. It also increases the risk of infection. Follow these instructions at home: Medicines  Take over-the-counter and prescription medicines only as told by your doctor.  If you were prescribed an antibiotic medicine, take it as told by your doctor. Do not stop taking the antibiotic even if you start to feel better.  If you develop a fever, do not take medicines to lower the fever right away. Tell your doctor about the fever. Managing pain, stiffness, and swelling  Try these methods to help with pain: ? Use a heating pad. ? Take a warm bath. ? Distract yourself, such as by watching TV. Eating and drinking  Drink enough fluid to keep your pee (urine) clear or pale yellow. Drink more in hot weather and during exercise.  Limit or avoid alcohol.  Eat a healthy diet. Eat plenty of fruits, vegetables, whole grains, and lean protein.  Take vitamins and supplements as told by your doctor. Traveling  When traveling, keep these with you: ? Your medical information. ? The names of your doctors. ? Your medicines.  If you need to take an airplane, talk to your doctor first. Activity  Rest often.  Avoid exercises that make your heart beat much faster, such as jogging. General instructions  Do not use products that have nicotine or tobacco, such as cigarettes and e-cigarettes. If you need help quitting, ask your doctor.  Consider wearing a medical alert bracelet.  Avoid being in high places (high altitudes), such as mountains.  Avoid very hot or cold temperatures.  Avoid places where the  temperature changes a lot.  Keep all follow-up visits as told by your doctor. This is important. Contact a doctor if:  A joint hurts.  Your feet or hands hurt or swell.  You feel tired (fatigued). Get help right away if:  You have symptoms of infection. These include: ? Fever. ? Chills. ? Being very tired. ? Irritability. ? Poor eating. ? Throwing up (vomiting).  You feel dizzy or faint.  You have new stomach pain, especially on the left side.  You have a an erection (priapism) that lasts more than 4 hours.  You have numbness in your arms or legs.  You have a hard time moving your arms or legs.  You have trouble talking.  You have pain that does not go away when you take medicine.  You are short of breath.  You are breathing fast.  You have a long-term cough.  You have pain in your chest.  You have a bad headache.  You have a stiff neck.  Your stomach looks bloated even though you did not eat much.  Your skin is pale.  You suddenly cannot see well. Summary  Sickle cell anemia is a condition where your red blood cells are shaped like sickles.  Follow your doctor's advice on ways to manage pain, food to eat, activities to do, and steps to take for safe travel.  Get medical help right away if you have any signs of infection, such as a fever. This information is not intended to replace advice given to you by   your health care provider. Make sure you discuss any questions you have with your health care provider. Document Revised: 02/09/2019 Document Reviewed: 11/23/2016 Elsevier Patient Education  2020 [employer] Inc.  

## 2020-04-29 NOTE — ED Provider Notes (Signed)
Care transferred from [place], PA-C at shift change.  See note for full HPI  In summation patient with sickle cell disease history of splenectomy presents for evaluation of pain consistent with her prior sickle cell crisis.  Pain located to back and bilateral lower legs.  Patient had received 3 doses of pain medicine from previous provider with consistent pain.  No chest pain, shortness of breath, cough.  She is without tachypnea or hypoxia.  She states her pain has not improved enough to go home.  Plan on following up on labs and contacting sickle cell clinic for transfer for pain control.  If they are at capacity plan for admission. Physical Exam  BP 112/81   Pulse 81   Temp 97.9 F (36.6 C) (Oral)   Resp 16   Ht 5\' 3"  (1.6 m)   Wt 81.6 kg   SpO2 97%   BMI 31.89 kg/m   Physical Exam Vitals and nursing note reviewed.  Constitutional:      General: She is not in acute distress.    Appearance: She is well-developed. She is not ill-appearing, toxic-appearing or diaphoretic.  HENT:     Head: Normocephalic and atraumatic.     Nose: Nose normal.     Mouth/Throat:     Mouth: Mucous membranes are moist.  Eyes:     Pupils: Pupils are equal, round, and reactive to light.  Cardiovascular:     Rate and Rhythm: Normal rate.     Pulses: Normal pulses.  Pulmonary:     Effort: Pulmonary effort is normal. No respiratory distress.     Breath sounds: Normal breath sounds.     Comments: Clear to auscultation without wheeze, rhonchi or rales Abdominal:     General: Bowel sounds are normal. There is no distension.  Musculoskeletal:        General: Normal range of motion.     Cervical back: Normal range of motion.     Comments: Compartments soft.  No bony tenderness.  Bevelyn Buckles' sign negative  Skin:    General: Skin is warm and dry.     Capillary Refill: Capillary refill takes less than 2 seconds.     Comments: No edema, erythema or warmth.  Tactile temperature to extremities  Neurological:      General: No focal deficit present.     Mental Status: She is alert and oriented to person, place, and time.     Comments: Ambulatory in room without difficulty     ED Course/Procedures     .Critical Care Performed by: Nettie Elm, PA-C Authorized by: Nettie Elm, PA-C   Critical care provider statement:    Critical care time (minutes):  45   Critical care was necessary to treat or prevent imminent or life-threatening deterioration of the following conditions: sickle cell crisis.   Critical care was time spent personally by me on the following activities:  Discussions with consultants, evaluation of patient's response to treatment, examination of patient, ordering and performing treatments and interventions, ordering and review of laboratory studies, ordering and review of radiographic studies, pulse oximetry, re-evaluation of patient's condition, obtaining history from patient or surrogate and review of old charts     Labs Reviewed  COMPREHENSIVE METABOLIC PANEL - Abnormal; Notable for the following components:      Result Value   Potassium 5.5 (*)    CO2 20 (*)    Total Protein 8.6 (*)    AST 68 (*)    All other components within  normal limits  CBC WITH DIFFERENTIAL/PLATELET - Abnormal; Notable for the following components:   WBC 10.9 (*)    RBC 3.22 (*)    Hemoglobin 9.5 (*)    HCT 27.2 (*)    Platelets 572 (*)    nRBC 0.8 (*)    Monocytes Absolute 1.2 (*)    All other components within normal limits  RETICULOCYTES - Abnormal; Notable for the following components:   Retic Ct Pct 6.3 (*)    RBC. 3.21 (*)    Retic Count, Absolute 200.6 (*)    Immature Retic Fract 32.4 (*)    All other components within normal limits  HCG, QUANTITATIVE, PREGNANCY  DG Chest 2 View  Result Date: 03/31/2020 CLINICAL DATA:  Productive cough. EXAM: CHEST - 2 VIEW COMPARISON:  09/09/2019 FINDINGS: The heart size and mediastinal contours are within normal limits. Both lungs are  clear except for minimal peribronchial thickening on the right. The visualized skeletal structures are unremarkable. IMPRESSION: Minimal bronchitic changes. Electronically Signed   By: Lorriane Shire M.D.   On: 03/31/2020 15:45   DG Chest Port 1 View  Result Date: 04/29/2020 CLINICAL DATA:  Sickle cell crisis with chest pain EXAM: PORTABLE CHEST 1 VIEW COMPARISON:  03/31/2020 FINDINGS: Chronic streaky opacity at the right base overlapping the diaphragm. There is no edema, consolidation, effusion, or pneumothorax. Normal heart size and mediastinal contours. IMPRESSION: Scarring at the right base. No acute finding when compared with prior. Electronically Signed   By: Monte Fantasia M.D.   On: 04/29/2020 04:67   MDM  29 year old known sickle cell presents for evaluation pain consistent with her prior sickle cell crisis.  Has received 3 doses of pain medicine here in the ED.  She has no evidence of acute chest syndrome.  She is stable vital signs.  Plan on following up on labs and likely sending to sickle cell clinic for more pain management.  Labs imaging personally viewed interpreted CBC with leukocytosis at 10.9, hemoglobin 9.5 Chest x-ray without evidence of acute infiltrates, cardiomegaly, pulmonary edema, pneumothorax Pregnancy test negative Retics elevated CMP with hyperkalemia however moderate hemolysis. Likely elevation 2/2 hemolysis.  Patient reassessed. Pain without significant improvement.  She is agreeable to being transferred to the sickle cell day clinic.  I discussed with [name], NP.  She agrees to accept patient for further pain management.  Low suspicion for acute chest, bacterial infectious process, VTE.  Patient will be discharged from ED and taken to the Center for further pain management of sickle cell crisis.  The patient has been appropriately medically screened and/or stabilized in the ED. I have low suspicion for any other emergent medical condition which would  require further screening, evaluation or treatment in the ED or require inpatient management.  Patient is hemodynamically stable and in no acute distress.  Patient able to ambulate in department prior to ED.  Evaluation does not show acute pathology that would require ongoing or additional emergent interventions while in the emergency department or further inpatient treatment.  I have discussed the diagnosis with the patient and answered all questions.  Pain is been managed while in the emergency department and patient has no further complaints prior to discharge.  Patient is comfortable with plan discussed in room and is stable for discharge at this time.  I have discussed strict return precautions for returning to the emergency department.  Patient was encouraged to follow-up with PCP/specialist refer to at discharge.       Ranon Coven  A, PA-C 04/29/20 0944    Hayden Rasmussen, MD 04/29/20 Carollee Massed

## 2020-04-29 NOTE — BH Specialist Note (Signed)
[facility] General Follow Up Note  04/29/2020 Name: SHTERNA LARAMEE MRN: [id number] DOB: 02-Oct-1991 ALEKSA COLLINSWORTH is a 29 y.o. year old female who sees Vevelyn Francois, NP for primary care. LCSW was initially consulted to assess mental health needs and assist the patient with Mental Health Counseling and Resources.  Interpreter: No.   Interpreter Name & Language: none  Ongoing Intervention: Patient experiencing depression. Patient was no-show for [facility] appointment 04/07/20. Today CSW met with patient at bedside in the day hospital. Discussed barriers to follow up and recent unhelpful thoughts and behaviors. Scheduled follow up for 05/12/20.   Review of patient status, including review of consultants reports, relevant laboratory and other test results, and collaboration with appropriate care team members and the patient's provider was performed as part of comprehensive patient evaluation and provision of services.     Follow up Plan: 1. [facility] appointment 05/12/20   Estanislado Emms, [facility] [phone]

## 2020-04-29 NOTE — Progress Notes (Signed)
Patient admitted to the day hospital for treatment of sickle cell pain crisis. Patient reported pain rated 9/10 in the back . Patient placed on Dilaudid PCA, given PO benadryl, IV zofran and hydrated with IV fluids. Patient transferred to [facility] room 8. Report given to [facility]. Reported  pain on transfer was 8/10. Patient transported in a wheelchair, alert and oriented.

## 2020-04-29 NOTE — H&P (Signed)
[facility]ick[facility] History and Physical   Date: 04/29/2020  Patient name: Carmen [name] Medical record number: [id number] Date of birth: Dec 21, 1990 Age: 29 y.o. Gender: female PCP: Vevelyn Francois, NP  Attending physician: Tresa Garter, MD  Chief Complaint: Sickle cell pain  History of Present Illness: Carmen [name] is a 29 year old female with a medical history significant for sickle cell disease, chronic pain syndrome, opiate dependence and tolerance, and history of anemia presents complaining of generalized pain that is consistent with her previous sickle cell pain crisis.  Patient was treated and evaluated in the emergency department this a.m. for this problem without complete resolution.  Agree with ER provider that patient was appropriate to transition to sickle cell day infusion center for further management of pain and extended observation.  Patient's pain persists despite IV Dilaudid and IV fluids.  She says the pain intensity has been worsening over the past several days.  She has been taking oxycodone consistently without sustained relief.  She has not identified any aggravating factors concerning current crisis.  She denies fever, chills, shortness of breath, urinary symptoms, nausea, vomiting, or diarrhea patient endorses some mid chest pain.  She denies headache, dizziness, or paresthesias.  No sick contacts, recent travel, or exposure to COVID-19.   Meds: Medications Prior to Admission  Medication Sig Dispense Refill Last Dose  . citalopram (CELEXA) 10 MG tablet Take 1 tablet (10 mg total) by mouth daily. 30 tablet 2 04/28/2020 at Unknown time  . cyclobenzaprine (FLEXERIL) 10 MG tablet Take 1 tablet (10 mg total) by mouth 2 (two) times daily. 60 tablet 2 04/28/2020 at Unknown time  . folic acid (FOLVITE) 1 MG tablet Take 1 tablet (1 mg total) by mouth daily. 90 tablet 3 04/28/2020 at Unknown time  . loratadine (CLARITIN) 10 MG tablet Take 1 tablet (10 mg total) by  mouth daily. 30 tablet 11 Past Week at Unknown time  . morphine (MS CONTIN) 60 MG 12 hr tablet Take 1 tablet (60 mg total) by mouth every 12 (twelve) hours. 60 tablet 0 04/28/2020 at Unknown time  . Oxycodone HCl 10 MG TABS Take 1 tablet (10 mg total) by mouth every 6 (six) hours as needed for up to 15 days (PAIN). 60 tablet 0 04/28/2020 at Unknown time  . Prenatal Vit-Fe Fumarate-FA (PRENATAL VITAMIN PLUS LOW IRON) 27-1 MG TABS TAKE 1 TABLET BY MOUTH EVERY DAY (Patient taking differently: Take 1 tablet by mouth daily. ) 30 tablet 13 04/28/2020 at Unknown time  . Vitamin D, Ergocalciferol, (DRISDOL) 1.25 MG (50000 UT) CAPS capsule TAKE ONE CAPSULE BY MOUTH ONE TIME PER WEEK (Patient taking differently: Take 50,000 Units by mouth every 7 (seven) days. ) 30 capsule 1 04/28/2020 at Unknown time  . acetaminophen (TYLENOL) 500 MG tablet Take 2 tablets (1,000 mg total) by mouth 2 (two) times daily as needed. (Patient not taking: Reported on 03/31/2020) 30 tablet 0   . aspirin EC 81 MG tablet Take 1 tablet (81 mg total) by mouth daily. (Patient not taking: Reported on 04/29/2020) 90 tablet 3   . ibuprofen (ADVIL) 800 MG tablet Take 1 tablet (800 mg total) by mouth 3 (three) times daily with meals as needed. (Patient not taking: Reported on 02/02/2020) 30 tablet 1     Allergies: Patient has no known allergies. Past Medical History:  Diagnosis Date  . Headache(784.0)   . Sickle cell crisis ([place])   . Syphilis 2015   Was diagnosed and  received one injection of antibiotics  . Thrombocytosis ([place]) 11/22/2014     CBC Latest Ref Rng & Units 12/13/2018 12/11/2018 12/10/2018 WBC 4.0 - 10.5 K/uL 14.8(H) 13.2(H) 15.9(H) Hemoglobin 12.0 - 15.0 g/dL 8.2(L) 7.7(L) 8.1(L) Hematocrit 36.0 - 46.0 % 23.7(L) 23.2(L) 24.5(L) Platelets 150 - 400 K/uL 326 399 388     Past Surgical History:  Procedure Laterality Date  . CESAREAN SECTION N/A 02/05/2019   Procedure: CESAREAN SECTION;  Surgeon: [name], MD;  Location: [facility] LD ORS;   Service: Obstetrics;  Laterality: N/A;  . CHOLECYSTECTOMY N/A 11/30/2014   Procedure: LAPAROSCOPIC CHOLECYSTECTOMY SINGLE SITE WITH INTRAOPERATIVE CHOLANGIOGRAM;  Surgeon: Michael Boston, MD;  Location: [facility] ORS;  Service: General;  Laterality: N/A;  . SPLENECTOMY     Family History  Problem Relation Age of Onset  . Hypertension Mother   . Sickle cell anemia Sister   . Kidney disease Sister        Lupus  . Arthritis Sister   . Sickle cell anemia Sister   . Sickle cell trait Sister   . Heart disease Maternal Aunt        CABG  . Heart disease Maternal Uncle        CABG  . Lupus Sister    Social History   Socioeconomic History  . Marital status: Single    Spouse name: Not on file  . Number of children: Not on file  . Years of education: Not on file  . Highest education level: Not on file  Occupational History  . Not on file  Tobacco Use  . Smoking status: Current Some Day Smoker    Types: Cigarettes  . Smokeless tobacco: Never Used  Vaping Use  . Vaping Use: Never used  Substance and Sexual Activity  . Alcohol use: No  . Drug use: No  . Sexual activity: Yes    Birth control/protection: Injection  Other Topics Concern  . Not on file  Social History Narrative  . Not on file   Social Determinants of Health   Financial Resource Strain:   . Difficulty of Paying Living Expenses:   Food Insecurity:   . Worried About Charity fundraiser in the Last Year:   . Arboriculturist in the Last Year:   Transportation Needs:   . Film/video editor (Medical):   Marland Kitchen Lack of Transportation (Non-Medical):   Physical Activity:   . Days of Exercise per Week:   . Minutes of Exercise per Session:   Stress:   . Feeling of Stress :   Social Connections:   . Frequency of Communication with Friends and Family:   . Frequency of Social Gatherings with Friends and Family:   . Attends Religious Services:   . Active Member of Clubs or Organizations:   . Attends Archivist Meetings:    Marland Kitchen Marital Status:   Intimate Partner Violence:   . Fear of Current or Ex-Partner:   . Emotionally Abused:   Marland Kitchen Physically Abused:   . Sexually Abused:    Review of Systems  Constitutional: Negative.   HENT: Negative.   Respiratory: Negative.   Gastrointestinal: Negative.   Genitourinary: Negative.   Musculoskeletal: Positive for back pain and joint pain.  Skin: Negative.   Neurological: Negative.   Psychiatric/Behavioral: Negative.     Physical Exam: Blood pressure 106/69, pulse 75, temperature 98.5 F (36.9 C), temperature source Oral, resp. rate 11, last menstrual period 04/15/2020, SpO2 99 %, not currently breastfeeding.  Physical Exam Constitutional:      Appearance: Normal appearance.  Eyes:     Pupils: Pupils are equal, round, and reactive to light.  Cardiovascular:     Rate and Rhythm: Normal rate and regular rhythm.     Pulses: Normal pulses.  Pulmonary:     Effort: Pulmonary effort is normal.  Abdominal:     General: Abdomen is flat. Bowel sounds are normal.  Musculoskeletal:        General: Normal range of motion.  Skin:    General: Skin is warm.  Neurological:     General: No focal deficit present.     Mental Status: She is alert. Mental status is at baseline.  Psychiatric:        Mood and Affect: Mood normal.        Behavior: Behavior normal.        Thought Content: Thought content normal.        Judgment: Judgment normal.      Lab results: Results for orders placed or performed during the hospital encounter of 04/29/20 (from the past 24 hour(s))  Comprehensive metabolic panel     Status: Abnormal   Collection Time: 04/29/20  4:30 AM  Result Value Ref Range   Sodium 135 135 - 145 mmol/L   Potassium 5.5 (H) 3.5 - 5.1 mmol/L   Chloride 101 98 - 111 mmol/L   CO2 20 (L) 22 - 32 mmol/L   Glucose, Bld 86 70 - 99 mg/dL   BUN 10 6 - 20 mg/dL   Creatinine, Ser 0.74 0.44 - 1.00 mg/dL   Calcium 9.4 8.9 - 10.3 mg/dL   Total Protein 8.6 (H) 6.5 - 8.1  g/dL   Albumin 4.4 3.5 - 5.0 g/dL   AST 68 (H) 15 - 41 U/L   ALT 31 0 - 44 U/L   Alkaline Phosphatase 61 38 - 126 U/L   Total Bilirubin 1.1 0.3 - 1.2 mg/dL   GFR calc non Af Amer >60 >60 mL/min   GFR calc Af Amer >60 >60 mL/min   Anion gap 14 5 - 15  CBC with Differential     Status: Abnormal   Collection Time: 04/29/20  4:30 AM  Result Value Ref Range   WBC 10.9 (H) 4.0 - 10.5 K/uL   RBC 3.22 (L) 3.87 - 5.11 MIL/uL   Hemoglobin 9.5 (L) 12.0 - 15.0 g/dL   HCT 27.2 (L) 36 - 46 %   MCV 84.5 80.0 - 100.0 fL   MCH 29.5 26.0 - 34.0 pg   MCHC 34.9 30.0 - 36.0 g/dL   RDW 14.8 11.5 - 15.5 %   Platelets 572 (H) 150 - 400 K/uL   nRBC 0.8 (H) 0.0 - 0.2 %   Neutrophils Relative % 64 %   Neutro Abs 7.0 1.7 - 7.7 K/uL   Lymphocytes Relative 23 %   Lymphs Abs 2.5 0.7 - 4.0 K/uL   Monocytes Relative 11 %   Monocytes Absolute 1.2 (H) 0 - 1 K/uL   Eosinophils Relative 1 %   Eosinophils Absolute 0.1 0 - 0 K/uL   Basophils Relative 0 %   Basophils Absolute 0.0 0 - 0 K/uL   Immature Granulocytes 1 %   Abs Immature Granulocytes 0.06 0.00 - 0.07 K/uL  Reticulocytes     Status: Abnormal   Collection Time: 04/29/20  4:30 AM  Result Value Ref Range   Retic Ct Pct 6.3 (H) 0.4 - 3.1 %   RBC. 3.21 (  L) 3.87 - 5.11 MIL/uL   Retic Count, Absolute 200.6 (H) 19.0 - 186.0 K/uL   Immature Retic Fract 32.4 (H) 2.3 - 15.9 %  hCG, quantitative, pregnancy     Status: None   Collection Time: 04/29/20  4:30 AM  Result Value Ref Range   hCG, Beta Chain, Quant, S <1 <5 mIU/mL    Imaging results:  DG Chest Port 1 View  Result Date: 04/29/2020 CLINICAL DATA:  Sickle cell crisis with chest pain EXAM: PORTABLE CHEST 1 VIEW COMPARISON:  03/31/2020 FINDINGS: Chronic streaky opacity at the right base overlapping the diaphragm. There is no edema, consolidation, effusion, or pneumothorax. Normal heart size and mediastinal contours. IMPRESSION: Scarring at the right base. No acute finding when compared with prior.  Electronically Signed   By: Monte Fantasia M.D.   On: 04/29/2020 04:52     Assessment & Plan: Patient admitted to sickle cell day infusion center for management of pain crisis.  Patient is opiate tolerant Initiate IV dilaudid PCA. Settings of 0.5 mg, 10 minute lockout, and 3 mg per hour IV fluids, 0.45 % saline at 100 ml/hr Toradol 15 mg IV times one dose Tylenol 1000 mg by mouth times one dose Review CBC with differential, complete metabolic panel, and reticulocytes as results become available.  Pain intensity will be reevaluated in context of functioning and relationship to baseline as care progress If pain intensity remains elevated and/or sudden change in hemodynamic stability transition to inpatient services for higher level of care.     [name]  APRN, MSN, FNP-C [facility] [address] [place], [place] [address] [phone]  04/29/2020, 12:51 PM

## 2020-04-29 NOTE — ED Provider Notes (Addendum)
[facility] DEPT Provider Note   CSN: [id number] Arrival date & time: 04/28/20  2010     History Chief Complaint  Patient presents with  . Sickle Cell Pain Crisis    Carmen [name] is a 29 y.o. female.  Patient with history Hb-SS sickle cell disease, h/o splenectomy, presents with pain in the entire back and bilateral lower legs for the past 4 days. Tonight she started having pain in the center of her chest. No SOB, cough or fever. No nausea, vomiting or diarrhea. Her regular pain medications include oxycodone 10 mg and oral Morphine, which she has but reports she is not getting any relief with.   The history is provided by the patient. No language interpreter was used.       Past Medical History:  Diagnosis Date  . Headache(784.0)   . Sickle cell crisis ([place])   . Syphilis 2015   Was diagnosed and received one injection of antibiotics  . Thrombocytosis ([place]) 11/22/2014     CBC Latest Ref Rng & Units 12/13/2018 12/11/2018 12/10/2018 WBC 4.0 - 10.5 K/uL 14.8(H) 13.2(H) 15.9(H) Hemoglobin 12.0 - 15.0 g/dL 8.2(L) 7.7(L) 8.1(L) Hematocrit 36.0 - 46.0 % 23.7(L) 23.2(L) 24.5(L) Platelets 150 - 400 K/uL 326 399 388      Patient Active Problem List   Diagnosis Date Noted  . Sickle cell crisis ([place]) 03/31/2020  . Bacterial vaginitis 01/04/2020  . Acute cystitis without hematuria   . Dysuria 12/31/2019  . Urine leukocytes   . Sickle cell pain crisis ([place]) 09/10/2019  . Sickle cell disease ([place]) 12/04/2018  . Alpha thalassemia silent carrier 12/04/2018  . Chronic prescription opiate use 03/13/2018  . Chronic musculoskeletal pain 03/13/2018  . Vitamin D deficiency 01/13/2018  . Leukocytosis 08/21/2017  . 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[place]) 04/05/2017  . Hb-SS disease without crisis ([place]) 08/15/2016  . Anemia of chronic disease   . Chest pain 11/10/2015  . Systolic ejection murmur 38/18/2993  . Sickle cell anemia of mother during pregnancy  [facility]) 05/16/2014    Past Surgical History:  Procedure Laterality Date  . CESAREAN SECTION N/A 02/05/2019   Procedure: CESAREAN SECTION;  Surgeon: [name], MD;  Location: [facility] LD [facility]S;  Service: Obstetrics;  Laterality: N/A;  . CHOLECYSTECTOMY N/A 11/30/2014   Procedure: LAPAROSCOPIC CHOLECYSTECTOMY SINGLE SITE WITH INTRAOPERATIVE CHOLANGIOGRAM;  Surgeon: Michael Boston, MD;  Location: [facility] ORS;  Service: General;  Laterality: N/A;  . SPLENECTOMY       OB History    Gravida  2   Para  1   Term  1   Preterm      AB  1   Living  1     SAB      TAB  1   Ectopic      Multiple  0   Live Births  1           Family History  Problem Relation Age of Onset  . Hypertension Mother   . Sickle cell anemia Sister   . Kidney disease Sister        Lupus  . Arthritis Sister   . Sickle cell anemia Sister   . Sickle cell trait Sister   . Heart disease Maternal Aunt        CABG  . Heart disease Maternal Uncle        CABG  . Lupus Sister     Social History   Tobacco Use  . Smoking  status: Current Some Day Smoker    Types: Cigarettes  . Smokeless tobacco: Never Used  Vaping Use  . Vaping Use: Never used  Substance Use Topics  . Alcohol use: No  . Drug use: No    Home Medications Prior to Admission medications   Medication Sig Start Date End Date Taking? Authorizing Provider  acetaminophen (TYLENOL) 500 MG tablet Take 2 tablets (1,000 mg total) by mouth 2 (two) times daily as needed. Patient not taking: Reported on 03/31/2020 12/04/19   Azzie Glatter, FNP  aspirin EC 81 MG tablet Take 1 tablet (81 mg total) by mouth daily. 03/15/19   Lanae Boast, FNP  citalopram (CELEXA) 10 MG tablet Take 1 tablet (10 mg total) by mouth daily. 03/28/20 06/26/20  Vevelyn Francois, NP  cyclobenzaprine (FLEXERIL) 10 MG tablet Take 1 tablet (10 mg total) by mouth 2 (two) times daily. 03/28/20 06/26/20  Vevelyn Francois, NP  folic acid (FOLVITE) 1 MG tablet Take 1 tablet (1 mg total) by  mouth daily. 03/15/19   Lanae Boast, FNP  ibuprofen (ADVIL) 800 MG tablet Take 1 tablet (800 mg total) by mouth 3 (three) times daily with meals as needed. Patient not taking: Reported on 02/02/2020 03/15/19   Lanae Boast, FNP  loratadine (CLARITIN) 10 MG tablet Take 1 tablet (10 mg total) by mouth daily. 03/28/20   Vevelyn Francois, NP  morphine (MS CONTIN) 60 MG 12 hr tablet Take 1 tablet (60 mg total) by mouth every 12 (twelve) hours. 04/02/20 05/02/20  Vevelyn Francois, NP  Oxycodone HCl 10 MG TABS Take 1 tablet (10 mg total) by mouth every 6 (six) hours as needed for up to 15 days (PAIN). 04/22/20 05/07/20  Vevelyn Francois, NP  Prenatal Vit-Fe Fumarate-FA (PRENATAL VITAMIN PLUS LOW IRON) 27-1 MG TABS TAKE 1 TABLET BY MOUTH EVERY DAY Patient taking differently: Take 1 tablet by mouth daily.  03/03/20   Shelly Bombard, MD  Vitamin D, Ergocalciferol, (DRISDOL) 1.25 MG (50000 UT) CAPS capsule TAKE ONE CAPSULE BY MOUTH ONE TIME PER WEEK Patient taking differently: Take 50,000 Units by mouth every 7 (seven) days.  10/11/19   Azzie Glatter, FNP    Allergies    Patient has no known allergies.  Review of Systems   Review of Systems  Constitutional: Negative for chills and fever.  HENT: Negative.   Respiratory: Negative.  Negative for cough and shortness of breath.   Cardiovascular: Positive for chest pain. Negative for leg swelling.  Gastrointestinal: Negative.  Negative for abdominal pain and nausea.  Genitourinary: Negative.   Musculoskeletal:       See HPI.  Skin: Negative.   Neurological: Negative.     Physical Exam Updated Vital Signs BP 137/74 (BP Location: Right Arm)   Pulse (!) 110   Temp 97.9 F (36.6 C) (Oral)   Resp 18   Ht 5\' 3"  (1.6 m)   Wt 81.6 kg   SpO2 98%   BMI 31.89 kg/m   Physical Exam Vitals and nursing note reviewed.  Constitutional:      Appearance: She is well-developed.  HENT:     Head: Normocephalic.     Mouth/Throat:     Mouth: Mucous membranes are  moist.  Eyes:     Comments: Eyes appear mildly icteric. No conjunctival pallor.   Cardiovascular:     Rate and Rhythm: Normal rate and regular rhythm.  Pulmonary:     Effort: Pulmonary effort is normal.  Breath sounds: Normal breath sounds.  Abdominal:     General: Bowel sounds are normal.     Palpations: Abdomen is soft.     Tenderness: There is no abdominal tenderness. There is no guarding or rebound.  Musculoskeletal:        General: Normal range of motion.     Cervical back: Normal range of motion and neck supple.     Comments: No swelling of extremities, no redness or warmth. FROM without strength deficits.   Skin:    General: Skin is warm and dry.     Findings: No rash.  Neurological:     Mental Status: She is alert and oriented to person, place, and time.     Sensory: No sensory deficit.     Deep Tendon Reflexes: Reflexes normal.     ED Results / Procedures / Treatments   Labs (all labs ordered are listed, but only abnormal results are displayed) Labs Reviewed  COMPREHENSIVE METABOLIC PANEL  CBC WITH DIFFERENTIAL/PLATELET  RETICULOCYTES  I-STAT BETA HCG BLOOD, ED ([facility], [facility], [facility] ONLY)    EKG None  Radiology No results found.  Procedures Procedures (including critical care time)  Medications Ordered in ED Medications - No data to display  ED Course  I have reviewed the triage vital signs and the nursing notes.  Pertinent labs & imaging results that were available during my care of the patient were reviewed by me and considered in my medical decision making (see chart for details).    MDM Rules/Calculators/A&P                          Patient with a history of sickle cell here with uncontrolled pain typical of crisis. No fever, SOB, cough.   She is well appearing. Does not appear in any distress. IV, labs, medications ordered per protocol. Chart reviewed. Patient has on average one ED visit per month and is not felt to abuse emergency services for  secondary gain of pain medications.   IV delayed secondary to poor access. IV Team consult pending.   CXR clear - no evidence acute chest. No fever, cough. No SOB or pleuritic symptoms, no hypoxia and no h/o clots. Doubt PE.   Recheck after second dose of pain medication, she feels her legs are better but her back is persistently painful. Labs delayed - collection vs lab. RN following up (6:50). Additional Dilaudid dose ordered. Will transfer to day hospital at 8:00 am when they open.   Patient care transferred to [facility], PA-C, to facilitate transfer to [facility].   Final Clinical Impression(s) / ED Diagnoses Final diagnoses:  None   1. Sickle cell disease with pain  Rx / DC Orders ED Discharge Orders    None       Charlann Lange, PA-C 04/29/20 0650    Charlann Lange, PA-C 04/29/20 8338    Merrily Pew, MD 04/29/20 [phone]

## 2020-04-30 DIAGNOSIS — F17210 Nicotine dependence, cigarettes, uncomplicated: Secondary | ICD-10-CM | POA: Diagnosis present

## 2020-04-30 DIAGNOSIS — Z79891 Long term (current) use of opiate analgesic: Secondary | ICD-10-CM | POA: Diagnosis not present

## 2020-04-30 DIAGNOSIS — G8929 Other chronic pain: Secondary | ICD-10-CM | POA: Diagnosis not present

## 2020-04-30 DIAGNOSIS — Z8261 Family history of arthritis: Secondary | ICD-10-CM | POA: Diagnosis not present

## 2020-04-30 DIAGNOSIS — Z832 Family history of diseases of the blood and blood-forming organs and certain disorders involving the immune mechanism: Secondary | ICD-10-CM | POA: Diagnosis not present

## 2020-04-30 DIAGNOSIS — D5700 Hb-SS disease with crisis, unspecified: Secondary | ICD-10-CM | POA: Diagnosis present

## 2020-04-30 DIAGNOSIS — E875 Hyperkalemia: Secondary | ICD-10-CM | POA: Diagnosis present

## 2020-04-30 DIAGNOSIS — Z20822 Contact with and (suspected) exposure to covid-19: Secondary | ICD-10-CM | POA: Diagnosis present

## 2020-04-30 DIAGNOSIS — D571 Sickle-cell disease without crisis: Secondary | ICD-10-CM | POA: Diagnosis not present

## 2020-04-30 DIAGNOSIS — Z8249 Family history of ischemic heart disease and other diseases of the circulatory system: Secondary | ICD-10-CM | POA: Diagnosis not present

## 2020-04-30 DIAGNOSIS — Z841 Family history of disorders of kidney and ureter: Secondary | ICD-10-CM | POA: Diagnosis not present

## 2020-04-30 DIAGNOSIS — M7918 Myalgia, other site: Secondary | ICD-10-CM | POA: Diagnosis not present

## 2020-04-30 DIAGNOSIS — Z9081 Acquired absence of spleen: Secondary | ICD-10-CM | POA: Diagnosis not present

## 2020-04-30 DIAGNOSIS — G894 Chronic pain syndrome: Secondary | ICD-10-CM | POA: Diagnosis present

## 2020-04-30 LAB — BASIC METABOLIC PANEL
Anion gap: 9 (ref 5–15)
BUN: 10 mg/dL (ref 6–20)
CO2: 24 mmol/L (ref 22–32)
Calcium: 9.2 mg/dL (ref 8.9–10.3)
Chloride: 103 mmol/L (ref 98–111)
Creatinine, Ser: 0.48 mg/dL (ref 0.44–1.00)
GFR calc Af Amer: >60 mL/min (ref >60)
GFR calc non Af Amer: >60 mL/min (ref >60)
Glucose, Bld: 87 mg/dL (ref 70–99)
Potassium: 4.2 mmol/L (ref 3.5–5.1)
Sodium: 136 mmol/L (ref 135–145)

## 2020-04-30 LAB — CBC
HCT: 25 % — ABNORMAL LOW (ref 36.0–46.0)
Hemoglobin: 8.8 g/dL — ABNORMAL LOW (ref 12.0–15.0)
MCH: 29.3 pg (ref 26.0–34.0)
MCHC: 35.2 g/dL (ref 30.0–36.0)
MCV: 83.3 fL (ref 80.0–100.0)
Platelets: 484 10*3/uL — ABNORMAL HIGH (ref 150–400)
RBC: 3 MIL/uL — ABNORMAL LOW (ref 3.87–5.11)
RDW: 15.1 % (ref 11.5–15.5)
WBC: 9.2 10*3/uL (ref 4.0–10.5)
nRBC: 0.5 % — ABNORMAL HIGH (ref 0.0–0.2)

## 2020-04-30 LAB — HIV ANTIBODY (ROUTINE TESTING W REFLEX): HIV Screen 4th Generation wRfx: NONREACTIVE

## 2020-04-30 LAB — SARS CORONAVIRUS 2 (TAT 6-24 HRS): SARS Coronavirus 2: NEGATIVE

## 2020-04-30 MED ORDER — OXYCODONE HCL 5 MG PO TABS: 15.0000 mg | ORAL_TABLET | Freq: Four times a day (QID) | ORAL | Status: DC | PRN

## 2020-04-30 NOTE — Progress Notes (Addendum)
Subjective: Carmen [name] is a 29 year old female with medical history significant for sickle cell disease, chronic pain syndrome, opiate dependence and tolerance, and history of anemia of chronic disease was admitted for sickle cell crisis.  Patient continues to complain of pain primarily to mid chest and lower extremities.  Pain persists despite IV Dilaudid PCA.  Pain intensity is 8/10 characterized as constant and throbbing. Patient states that it is difficult to find a comfortable position.  She denies dizziness, headache, shortness of breath, urinary symptoms, nausea, vomiting, or diarrhea.  Objective:  Vital signs in last 24 hours:  Vitals:   04/30/20 0457 04/30/20 0740 04/30/20 1200 04/30/20 1701  BP:      Pulse:      Resp: 17 16 16 16   Temp:      TempSrc:      SpO2: 97% 93% 94% 96%    Intake/Output from previous day:   Intake/Output Summary (Last 24 hours) at 04/30/2020 1719 Last data filed at 04/30/2020 0300 Gross per 24 hour  Intake 1717.75 ml  Output --  Net 1717.75 ml    Physical Exam: General: Alert, awake, oriented x3, in no acute distress.  HEENT: [place]/AT PEERL, EOMI Neck: Trachea midline,  no masses, no thyromegal,y no JVD, no carotid bruit OROPHARYNX:  Moist, No exudate/ erythema/lesions.  Heart: Regular rate and rhythm, without murmurs, rubs, gallops, PMI non-displaced, no heaves or thrills on palpation.  Lungs: Clear to auscultation, no wheezing or rhonchi noted. No increased vocal fremitus resonant to percussion  Abdomen: Soft, nontender, nondistended, positive bowel sounds, no masses no hepatosplenomegaly noted..  Neuro: No focal neurological deficits noted cranial nerves II through XII grossly intact. DTRs 2+ bilaterally upper and lower extremities. Strength 5 out of 5 in bilateral upper and lower extremities. Musculoskeletal: No warm swelling or erythema around joints, no spinal tenderness noted. Psychiatric: Patient alert and oriented x3, good insight and  cognition, good recent to remote recall. Lymph node survey: No cervical axillary or inguinal lymphadenopathy noted.  Lab Results:  Basic Metabolic Panel:    Component Value Date/Time   NA 136 04/30/2020 0612   NA 135 03/28/2020 1542   K 4.2 04/30/2020 0612   CL 103 04/30/2020 0612   CO2 24 04/30/2020 0612   BUN 10 04/30/2020 0612   BUN 9 03/28/2020 1542   CREATININE 0.48 04/30/2020 0612   GLUCOSE 87 04/30/2020 0612   CALCIUM 9.2 04/30/2020 0612   CBC:    Component Value Date/Time   WBC 9.2 04/30/2020 0612   HGB 8.8 (L) 04/30/2020 0612   HGB 9.8 (L) 03/28/2020 1542   HCT 25.0 (L) 04/30/2020 0612   HCT 29.8 (L) 03/28/2020 1542   PLT 484 (H) 04/30/2020 0612   PLT 516 (H) 03/28/2020 1542   MCV 83.3 04/30/2020 0612   MCV 89 03/28/2020 1542   NEUTROABS 7.0 04/29/2020 0430   NEUTROABS 5.6 03/28/2020 1542   LYMPHSABS 2.5 04/29/2020 0430   LYMPHSABS 4.6 (H) 03/28/2020 1542   MONOABS 1.2 (H) 04/29/2020 0430   EOSABS 0.1 04/29/2020 0430   EOSABS 0.2 03/28/2020 1542   BASOSABS 0.0 04/29/2020 0430   BASOSABS 0.1 03/28/2020 1542    Recent Results (from the past 240 hour(s))  SARS CORONAVIRUS 2 (TAT 6-24 HRS) Nasopharyngeal Nasopharyngeal Swab     Status: None   Collection Time: 04/29/20  4:34 PM   Specimen: Nasopharyngeal Swab  Result Value Ref Range Status   SARS Coronavirus 2 NEGATIVE NEGATIVE Final    Comment: (NOTE) SARS-CoV-2 target  nucleic acids are NOT DETECTED.  The SARS-CoV-2 RNA is generally detectable in upper and lower respiratory specimens during the acute phase of infection. Negative results do not preclude SARS-CoV-2 infection, do not rule out co-infections with other pathogens, and should not be used as the sole basis for treatment or other patient management decisions. Negative results must be combined with clinical observations, patient history, and epidemiological information. The expected result is Negative.  Fact Sheet for  Patients: SugarRoll.be  Fact Sheet for Healthcare Providers: https://www.woods-mathews.com/  This test is not yet approved or cleared by the [place] FDA and  has been authorized for detection and/or diagnosis of SARS-CoV-2 by FDA under an Emergency Use Authorization (EUA). This EUA will remain  in effect (meaning this test can be used) for the duration of the COVID-19 declaration under Se ction 564(b)(1) of the Act, 21 U.S.C. section 360bbb-3(b)(1), unless the authorization is terminated or revoked sooner.  Performed at [facility], [employer] [address] [place], [place] [address]     Studies/Results: DG Chest Port 1 View  Result Date: 04/29/2020 CLINICAL DATA:  Sickle cell crisis with chest pain EXAM: PORTABLE CHEST 1 VIEW COMPARISON:  03/31/2020 FINDINGS: Chronic streaky opacity at the right base overlapping the diaphragm. There is no edema, consolidation, effusion, or pneumothorax. Normal heart size and mediastinal contours. IMPRESSION: Scarring at the right base. No acute finding when compared with prior. Electronically Signed   By: Monte Fantasia M.D.   On: 04/29/2020 04:52    Medications: Scheduled Meds:  aspirin EC  81 mg Oral Daily   citalopram  10 mg Oral Daily   cyclobenzaprine  10 mg Oral BID   enoxaparin (LOVENOX) injection  40 mg Subcutaneous V25D   folic acid  1 mg Oral Daily   HYDROmorphone   Intravenous Q4H   ketorolac  15 mg Intravenous Q6H   morphine  60 mg Oral Q12H   senna-docusate  1 tablet Oral BID   Continuous Infusions:  sodium chloride 10 mL/hr at 04/30/20 1021   diphenhydrAMINE     PRN Meds:.diphenhydrAMINE **OR** diphenhydrAMINE, naloxone **AND** sodium chloride flush, ondansetron (ZOFRAN) IV, oxyCODONE, polyethylene glycol  Consultants:  None  Procedures:  None  Antibiotics:  None  Assessment/Plan: Principal Problem:   Sickle cell pain crisis (HCC) Active Problems:    Chronic musculoskeletal pain  Sickle cell disease with pain crisis: Continue IV Dilaudid PCA Oxycodone 15 mg every 4 6 hours as needed for severe breakthrough pain Toradol 15 mg IV every 6 hours for total of 5 days Monitor vital signs closely, reevaluate pain scale regularly, and supplemental oxygen as needed.  Sickle cell anemia: Hemoglobin stable and consistent with patient's baseline.  There is no clinical indication for blood transfusion at this time.  Continue to follow CBC in a.m.  Chronic pain syndrome: Continue home medications  Code Status: Full Code Family Communication: N/A Disposition Plan: Not yet ready for discharge  [place], MSN, FNP-C Patient [place], [place] [address] [phone]   If 5PM-7AM, please contact night-coverage.  04/30/2020, 5:19 PM  LOS: 0 days

## 2020-05-01 ENCOUNTER — Telehealth: Payer: Self-pay | Admitting: Nurse Practitioner

## 2020-05-01 ENCOUNTER — Other Ambulatory Visit: Payer: Self-pay | Admitting: Family Medicine

## 2020-05-01 DIAGNOSIS — Z79891 Long term (current) use of opiate analgesic: Secondary | ICD-10-CM

## 2020-05-01 DIAGNOSIS — D571 Sickle-cell disease without crisis: Secondary | ICD-10-CM

## 2020-05-01 MED ORDER — OXYCODONE HCL 10 MG PO TABS: 10.0000 mg | ORAL_TABLET | Freq: Four times a day (QID) | ORAL | 0 refills | Status: DC | PRN

## 2020-05-01 MED ORDER — HYDROMORPHONE 1 MG/ML IV SOLN
INTRAVENOUS | Status: DC
  Administered 2020-05-01: 30 mg via INTRAVENOUS
  Administered 2020-05-01: 12 mg via INTRAVENOUS
  Administered 2020-05-01: 7 mg via INTRAVENOUS
  Administered 2020-05-02: 3 mg via INTRAVENOUS
  Administered 2020-05-02: 1 mg via INTRAVENOUS
  Administered 2020-05-02: 5.5 mg via INTRAVENOUS
  Administered 2020-05-02: 2.5 mg via INTRAVENOUS
  Filled 2020-05-01: qty 30

## 2020-05-01 MED ORDER — MORPHINE SULFATE ER 60 MG PO TBCR: 60.0000 mg | EXTENDED_RELEASE_TABLET | Freq: Two times a day (BID) | ORAL | 0 refills | Status: DC

## 2020-05-01 MED ORDER — OXYCODONE HCL 5 MG PO TABS
15.0000 mg | ORAL_TABLET | ORAL | Status: DC | PRN
  Administered 2020-05-01 – 2020-05-02 (×3): 15 mg via ORAL
  Filled 2020-05-01 (×3): qty 3

## 2020-05-01 NOTE — Telephone Encounter (Signed)
Pt called in refill for morphine 60mg  (due: 7/2) and oxycodone 10mg  (due: 7/7). However, Pt currently admitted into hospital

## 2020-05-01 NOTE — Progress Notes (Signed)
Subjective: Carmen [name] is a 29 year old female with medical history significant for sickle cell disease, chronic pain syndrome, opiate dependence and tolerance, and history of anemia of chronic disease was admitted for sickle cell pain crisis.  Today, patient states that mid chest pain has improved.  Pain is primarily localized to low back.  Pain intensity is 8/10 characterized as constant and aching.  Patient has been able to ambulate in room without assistance.  She has no other new complaints on today. She denies headache, chest pain, urinary symptoms, nausea, vomiting, or diarrhea.  Objective:  Vital signs in last 24 hours:  Vitals:   05/01/20 0536 05/01/20 0835 05/01/20 0941 05/01/20 1115  BP:   119/73   Pulse:   77   Resp: 11 14 15 16   Temp:   98.7 F (37.1 C)   TempSrc:   Oral   SpO2: 94% 97% 96% 97%    Intake/Output from previous day:  No intake or output data in the 24 hours ending 05/01/20 1204  Physical Exam: General: Alert, awake, oriented x3, in no acute distress.  HEENT: [place]/AT PEERL, EOMI Neck: Trachea midline,  no masses, no thyromegal,y no JVD, no carotid bruit OROPHARYNX:  Moist, No exudate/ erythema/lesions.  Heart: Regular rate and rhythm, without murmurs, rubs, gallops, PMI non-displaced, no heaves or thrills on palpation.  Lungs: Clear to auscultation, no wheezing or rhonchi noted. No increased vocal fremitus resonant to percussion  Abdomen: Soft, nontender, nondistended, positive bowel sounds, no masses no hepatosplenomegaly noted..  Neuro: No focal neurological deficits noted cranial nerves II through XII grossly intact. DTRs 2+ bilaterally upper and lower extremities. Strength 5 out of 5 in bilateral upper and lower extremities. Musculoskeletal: No warm swelling or erythema around joints, no spinal tenderness noted. Psychiatric: Patient alert and oriented x3, good insight and cognition, good recent to remote recall. Lymph node survey: No cervical axillary  or inguinal lymphadenopathy noted.  Lab Results:  Basic Metabolic Panel:    Component Value Date/Time   NA 136 04/30/2020 0612   NA 135 03/28/2020 1542   K 4.2 04/30/2020 0612   CL 103 04/30/2020 0612   CO2 24 04/30/2020 0612   BUN 10 04/30/2020 0612   BUN 9 03/28/2020 1542   CREATININE 0.48 04/30/2020 0612   GLUCOSE 87 04/30/2020 0612   CALCIUM 9.2 04/30/2020 0612   CBC:    Component Value Date/Time   WBC 9.2 04/30/2020 0612   HGB 8.8 (L) 04/30/2020 0612   HGB 9.8 (L) 03/28/2020 1542   HCT 25.0 (L) 04/30/2020 0612   HCT 29.8 (L) 03/28/2020 1542   PLT 484 (H) 04/30/2020 0612   PLT 516 (H) 03/28/2020 1542   MCV 83.3 04/30/2020 0612   MCV 89 03/28/2020 1542   NEUTROABS 7.0 04/29/2020 0430   NEUTROABS 5.6 03/28/2020 1542   LYMPHSABS 2.5 04/29/2020 0430   LYMPHSABS 4.6 (H) 03/28/2020 1542   MONOABS 1.2 (H) 04/29/2020 0430   EOSABS 0.1 04/29/2020 0430   EOSABS 0.2 03/28/2020 1542   BASOSABS 0.0 04/29/2020 0430   BASOSABS 0.1 03/28/2020 1542    Recent Results (from the past 240 hour(s))  SARS CORONAVIRUS 2 (TAT 6-24 HRS) Nasopharyngeal Nasopharyngeal Swab     Status: None   Collection Time: 04/29/20  4:34 PM   Specimen: Nasopharyngeal Swab  Result Value Ref Range Status   SARS Coronavirus 2 NEGATIVE NEGATIVE Final    Comment: (NOTE) SARS-CoV-2 target nucleic acids are NOT DETECTED.  The SARS-CoV-2 RNA is generally detectable in  upper and lower respiratory specimens during the acute phase of infection. Negative results do not preclude SARS-CoV-2 infection, do not rule out co-infections with other pathogens, and should not be used as the sole basis for treatment or other patient management decisions. Negative results must be combined with clinical observations, patient history, and epidemiological information. The expected result is Negative.  Fact Sheet for Patients: SugarRoll.be  Fact Sheet for Healthcare  Providers: https://www.woods-mathews.com/  This test is not yet approved or cleared by the [place] FDA and  has been authorized for detection and/or diagnosis of SARS-CoV-2 by FDA under an Emergency Use Authorization (EUA). This EUA will remain  in effect (meaning this test can be used) for the duration of the COVID-19 declaration under Se ction 564(b)(1) of the Act, 21 U.S.C. section 360bbb-3(b)(1), unless the authorization is terminated or revoked sooner.  Performed at [facility], [place] [address], [place], [place] [address]     Studies/Results: No results found.  Medications: Scheduled Meds: . aspirin EC  81 mg Oral Daily  . citalopram  10 mg Oral Daily  . cyclobenzaprine  10 mg Oral BID  . enoxaparin (LOVENOX) injection  40 mg Subcutaneous Q24H  . folic acid  1 mg Oral Daily  . HYDROmorphone   Intravenous Q4H  . ketorolac  15 mg Intravenous Q6H  . morphine  60 mg Oral Q12H  . senna-docusate  1 tablet Oral BID   Continuous Infusions: . sodium chloride 10 mL/hr at 04/30/20 1021  . diphenhydrAMINE     PRN Meds:.diphenhydrAMINE **OR** diphenhydrAMINE, naloxone **AND** sodium chloride flush, ondansetron (ZOFRAN) IV, oxyCODONE, polyethylene glycol  Consultants:  None  Procedures:  None  Antibiotics:  None  Assessment/Plan: Principal Problem:   Sickle cell pain crisis (HCC) Active Problems:   Chronic musculoskeletal pain  Sickle cell disease with pain crisis: Continue IV Dilaudid PCA, settings decreased to 0.5 mg, 10-minute lockout, and 3 mg/h. Oxycodone 15 mg every 4 hours as needed for severe breakthrough pain Toradol 15 mg IV every 6 hours for total of 5 days Monitor vital signs closely, reevaluate pain scale regularly, and supplemental oxygen as needed.  Sickle cell anemia: Hemoglobin stable and consistent with patient's baseline.  There is no clinical indication for blood transfusion at this time.  Continue to follow CBC in  a.m.  Chronic pain syndrome: Continue home medication Code Status: Full Code Family Communication: N/A Disposition Plan: Not yet ready for discharge   [place], MSN, FNP-C Patient [place] [address] [place], [place] [address] [phone]  If 5PM-7AM, please contact night-coverage.  05/01/2020, 12:04 PM  LOS: 1 day

## 2020-05-02 ENCOUNTER — Encounter: Payer: Self-pay | Admitting: Family Medicine

## 2020-05-02 DIAGNOSIS — Z79891 Long term (current) use of opiate analgesic: Secondary | ICD-10-CM

## 2020-05-02 DIAGNOSIS — D571 Sickle-cell disease without crisis: Secondary | ICD-10-CM

## 2020-05-02 LAB — CBC
HCT: 23.6 % — ABNORMAL LOW (ref 36.0–46.0)
Hemoglobin: 8.5 g/dL — ABNORMAL LOW (ref 12.0–15.0)
MCH: 29.6 pg (ref 26.0–34.0)
MCHC: 36 g/dL (ref 30.0–36.0)
MCV: 82.2 fL (ref 80.0–100.0)
Platelets: 464 10*3/uL — ABNORMAL HIGH (ref 150–400)
RBC: 2.87 MIL/uL — ABNORMAL LOW (ref 3.87–5.11)
RDW: 15.5 % (ref 11.5–15.5)
WBC: 11.5 10*3/uL — ABNORMAL HIGH (ref 4.0–10.5)
nRBC: 0.5 % — ABNORMAL HIGH (ref 0.0–0.2)

## 2020-05-02 MED ORDER — ASPIRIN EC 81 MG PO TBEC
81.0000 mg | DELAYED_RELEASE_TABLET | Freq: Every day | ORAL | 3 refills | Status: DC
Start: 2020-05-02 — End: 2020-05-04

## 2020-05-02 NOTE — Discharge Summary (Signed)
Physician Discharge Summary  Carmen [name] IRW:[id number] DOB: 11-11-1990 DOA: 04/29/2020  PCP: Vevelyn Francois, NP  Admit date: 04/29/2020  Discharge date: 05/02/2020  Discharge Diagnoses:  Principal Problem:   Sickle cell pain crisis [facility]) Active Problems:   Chronic musculoskeletal pain   Discharge Condition: Stable  Disposition:   Follow-up Information    Vevelyn Francois, NP Follow up in 1 week(s).   Specialty: Adult Health Nurse Practitioner Contact information: [address] [name] [address] [phone]              Pt is discharged home in good condition and is to follow up with Vevelyn Francois, NP this week to have labs evaluated. Carmen [name] is instructed to increase activity slowly and balance with rest for the next few days, and use prescribed medication to complete treatment of pain  Diet: Regular Wt Readings from Last 3 Encounters:  05/02/20 86.3 kg  04/28/20 81.6 kg  03/31/20 78.3 kg    History of present illness:  Carmen [name] is a 29 year old female with a medical history significant for sickle cell disease, chronic pain syndrome, opiate dependence and tolerance, and history of anemia presents complaining of generalized pain that is consistent with her previous sickle cell pain crisis.  Patient was treated and evaluated in the emergency department this a.m. for this problem without complete resolution.  Agree with ER provider that patient was appropriate to transition to sickle cell day infusion center for further management of pain and extended observation.  Patient's pain persists despite IV Dilaudid and IV fluids.  She says the pain intensity has been worsening over the past several days.  She has been taking oxycodone consistently without sustained relief.  She has not identified any aggravating factors concerning current crisis.  She denies fever, chills, shortness of breath, urinary symptoms, nausea, vomiting, or diarrhea patient endorses  some mid chest pain.  She denies headache, dizziness, or paresthesias.  No sick contacts, recent travel, or exposure to COVID-19.   Sickle cell day infusion center course: Patient found to have vitals of BP 106/69 (BP Location: Right Arm)    Pulse 75    Temp 98.5 F (36.9 C) (Oral)    Resp 11    LMP 04/15/2020    SpO2 99% .  Review laboratory values, mostly consistent with patient's baseline.  Hemoglobin 9.5.  COVID-19 test pending.  Pain persists despite IV Dilaudid PCA and IV fluids, admit to MedSurg and observation for further management of pain crisis.   Hospital Course:  Sickle cell disease with pain crisis: Patient was admitted for sickle cell pain crisis and managed appropriately with IVF, IV Dilaudid via PCA and IV Toradol, as well as other adjunct therapies per sickle cell pain management protocols.  IV Dilaudid PCA weaned appropriately.  Patient transition to home medication.  MS Contin was continued throughout admission without interruption. Pain intensity decreased to 6/10.  Patient is requesting discharge home.  She says that she can manage at home on current medication regimen.  Patient will follow-up with PCP for medication management and to repeat CBC in 1 week.  She is requesting note to return to work, note provided. Patient is alert, oriented, and ambulating without assistance.  She is afebrile and oxygen saturation is 99% on RA.  Patient was discharged home today in a hemodynamically stable condition.   Discharge Exam: Vitals:   05/02/20 1344 05/02/20 1509  BP: (!) 131/110   Pulse: 88  Resp: 14 19  Temp: 99.5 F (37.5 C)   SpO2: 97% 97%   Vitals:   05/02/20 0933 05/02/20 1206 05/02/20 1344 05/02/20 1509  BP: 108/86  (!) 131/110   Pulse: 92  88   Resp: 14 16 14 19   Temp: 99.5 F (37.5 C)  99.5 F (37.5 C)   TempSrc: Oral  Oral   SpO2: 99% 99% 97% 97%  Weight:        General appearance : Awake, alert, not in any distress. Speech Clear. Not toxic  looking HEENT: Atraumatic and Normocephalic, pupils equally reactive to light and accomodation Neck: Supple, no JVD. No cervical lymphadenopathy.  Chest: Good air entry bilaterally, no added sounds  CVS: S1 S2 regular, no murmurs.  Abdomen: Bowel sounds present, Non tender and not distended with no gaurding, rigidity or rebound. Extremities: B/L Lower Ext shows no edema, both legs are warm to touch Neurology: Awake alert, and oriented X 3, CN II-XII intact, Non focal Skin: No Rash  Discharge Instructions  Discharge Instructions    Discharge patient   Complete by: As directed    Discharge disposition: 01-Home or Self Care   Discharge patient date: 05/02/2020     Allergies as of 05/02/2020   No Known Allergies     Medication List    TAKE these medications   aspirin EC 81 MG tablet Take 1 tablet (81 mg total) by mouth daily.   citalopram 10 MG tablet Commonly known as: CeleXA Take 1 tablet (10 mg total) by mouth daily.   cyclobenzaprine 10 MG tablet Commonly known as: FLEXERIL Take 1 tablet (10 mg total) by mouth 2 (two) times daily.   folic acid 1 MG tablet Commonly known as: FOLVITE Take 1 tablet (1 mg total) by mouth daily.   loratadine 10 MG tablet Commonly known as: CLARITIN Take 1 tablet (10 mg total) by mouth daily. What changed:   when to take this  reasons to take this   morphine 60 MG 12 hr tablet Commonly known as: MS Contin Take 1 tablet (60 mg total) by mouth every 12 (twelve) hours. What changed: Another medication with the same name was added. Make sure you understand how and when to take each.   MS Contin 60 MG 12 hr tablet Generic drug: morphine Take 1 tablet (60 mg total) by mouth every 12 (twelve) hours. What changed: You were already taking a medication with the same name, and this prescription was added. Make sure you understand how and when to take each.   Oxycodone HCl 10 MG Tabs Take 1 tablet (10 mg total) by mouth every 6 (six) hours as  needed (PAIN). What changed: You were already taking a medication with the same name, and this prescription was added. Make sure you understand how and when to take each.   Oxycodone HCl 10 MG Tabs Take 1 tablet (10 mg total) by mouth every 6 (six) hours as needed for up to 15 days (PAIN). Start taking on: May 06, 2020 What changed: These instructions start on May 06, 2020. If you are unsure what to do until then, ask your doctor or other care provider.   Prenatal Vitamin Plus Low Iron 27-1 MG Tabs TAKE 1 TABLET BY MOUTH EVERY DAY   Vitamin D (Ergocalciferol) 1.25 MG (50000 UNIT) Caps capsule Commonly known as: DRISDOL TAKE ONE CAPSULE BY MOUTH ONE TIME PER WEEK What changed: See the new instructions.       The results of significant diagnostics  from this hospitalization (including imaging, microbiology, ancillary and laboratory) are listed below for reference.    Significant Diagnostic Studies: DG Chest Port 1 View  Result Date: 04/29/2020 CLINICAL DATA:  Sickle cell crisis with chest pain EXAM: PORTABLE CHEST 1 VIEW COMPARISON:  03/31/2020 FINDINGS: Chronic streaky opacity at the right base overlapping the diaphragm. There is no edema, consolidation, effusion, or pneumothorax. Normal heart size and mediastinal contours. IMPRESSION: Scarring at the right base. No acute finding when compared with prior. Electronically Signed   By: Monte Fantasia M.D.   On: 04/29/2020 04:52    Microbiology: Recent Results (from the past 240 hour(s))  SARS CORONAVIRUS 2 (TAT 6-24 HRS) Nasopharyngeal Nasopharyngeal Swab     Status: None   Collection Time: 04/29/20  4:34 PM   Specimen: Nasopharyngeal Swab  Result Value Ref Range Status   SARS Coronavirus 2 NEGATIVE NEGATIVE Final    Comment: (NOTE) SARS-CoV-2 target nucleic acids are NOT DETECTED.  The SARS-CoV-2 RNA is generally detectable in upper and lower respiratory specimens during the acute phase of infection. Negative results do not  preclude SARS-CoV-2 infection, do not rule out co-infections with other pathogens, and should not be used as the sole basis for treatment or other patient management decisions. Negative results must be combined with clinical observations, patient history, and epidemiological information. The expected result is Negative.  Fact Sheet for Patients: SugarRoll.be  Fact Sheet for Healthcare Providers: https://www.woods-mathews.com/  This test is not yet approved or cleared by the [place] FDA and  has been authorized for detection and/or diagnosis of SARS-CoV-2 by FDA under an Emergency Use Authorization (EUA). This EUA will remain  in effect (meaning this test can be used) for the duration of the COVID-19 declaration under Se ction 564(b)(1) of the Act, 21 U.S.C. section 360bbb-3(b)(1), unless the authorization is terminated or revoked sooner.  Performed at [facility], [place] [address], [place], [place] [address]      Labs: Basic Metabolic Panel: Recent Labs  Lab 04/29/20 0430 04/30/20 0612  NA 135 136  K 5.5* 4.2  CL 101 103  CO2 20* 24  GLUCOSE 86 87  BUN 10 10  CREATININE 0.74 0.48  CALCIUM 9.4 9.2   Liver Function Tests: Recent Labs  Lab 04/29/20 0430  AST 68*  ALT 31  ALKPHOS 61  BILITOT 1.1  PROT 8.6*  ALBUMIN 4.4   No results for input(s): LIPASE, AMYLASE in the last 168 hours. No results for input(s): AMMONIA in the last 168 hours. CBC: Recent Labs  Lab 04/29/20 0430 04/30/20 0612 05/02/20 0526  WBC 10.9* 9.2 11.5*  NEUTROABS 7.0  --   --   HGB 9.5* 8.8* 8.5*  HCT 27.2* 25.0* 23.6*  MCV 84.5 83.3 82.2  PLT 572* 484* 464*   Cardiac Enzymes: No results for input(s): CKTOTAL, CKMB, CKMBINDEX, TROPONINI in the last 168 hours. BNP: Invalid input(s): POCBNP CBG: No results for input(s): GLUCAP in the last 168 hours.  Time coordinating discharge: 40 minutes  Signed:  [name]   APRN, MSN, FNP-C [facility] [address] [place], [place] [address] [phone]  [facility] 05/02/2020, 4:30 PM

## 2020-05-04 ENCOUNTER — Other Ambulatory Visit: Payer: Self-pay

## 2020-05-04 ENCOUNTER — Encounter (HOSPITAL_COMMUNITY): Payer: Self-pay | Admitting: *Deleted

## 2020-05-04 ENCOUNTER — Inpatient Hospital Stay (HOSPITAL_COMMUNITY)
Admission: EM | Admit: 2020-05-04 | Discharge: 2020-05-06 | DRG: 812 | Disposition: A | Discharge status: Home / Self Care | Payer: Medicaid Other | Attending: Internal Medicine | Admitting: Internal Medicine

## 2020-05-04 DIAGNOSIS — Z841 Family history of disorders of kidney and ureter: Secondary | ICD-10-CM

## 2020-05-04 DIAGNOSIS — Z9081 Acquired absence of spleen: Secondary | ICD-10-CM

## 2020-05-04 DIAGNOSIS — Z7982 Long term (current) use of aspirin: Secondary | ICD-10-CM

## 2020-05-04 DIAGNOSIS — Z832 Family history of diseases of the blood and blood-forming organs and certain disorders involving the immune mechanism: Secondary | ICD-10-CM

## 2020-05-04 DIAGNOSIS — D5700 Hb-SS disease with crisis, unspecified: Secondary | ICD-10-CM | POA: Diagnosis not present

## 2020-05-04 DIAGNOSIS — Z20822 Contact with and (suspected) exposure to covid-19: Secondary | ICD-10-CM | POA: Diagnosis present

## 2020-05-04 DIAGNOSIS — Z8261 Family history of arthritis: Secondary | ICD-10-CM

## 2020-05-04 DIAGNOSIS — Z79899 Other long term (current) drug therapy: Secondary | ICD-10-CM

## 2020-05-04 DIAGNOSIS — D638 Anemia in other chronic diseases classified elsewhere: Secondary | ICD-10-CM

## 2020-05-04 DIAGNOSIS — F17210 Nicotine dependence, cigarettes, uncomplicated: Secondary | ICD-10-CM | POA: Diagnosis present

## 2020-05-04 DIAGNOSIS — Z8249 Family history of ischemic heart disease and other diseases of the circulatory system: Secondary | ICD-10-CM

## 2020-05-04 DIAGNOSIS — Z79891 Long term (current) use of opiate analgesic: Secondary | ICD-10-CM | POA: Diagnosis not present

## 2020-05-04 DIAGNOSIS — Z9049 Acquired absence of other specified parts of digestive tract: Secondary | ICD-10-CM

## 2020-05-04 DIAGNOSIS — G894 Chronic pain syndrome: Secondary | ICD-10-CM | POA: Diagnosis present

## 2020-05-04 LAB — CBC WITH DIFFERENTIAL/PLATELET
Abs Immature Granulocytes: 0.05 10*3/uL (ref 0.00–0.07)
Basophils Absolute: 0.1 10*3/uL (ref 0.0–0.1)
Basophils Relative: 1 %
Eosinophils Absolute: 0.2 10*3/uL (ref 0.0–0.5)
Eosinophils Relative: 2 %
HCT: 23.5 % — ABNORMAL LOW (ref 36.0–46.0)
Hemoglobin: 8.3 g/dL — ABNORMAL LOW (ref 12.0–15.0)
Immature Granulocytes: 1 %
Lymphocytes Relative: 40 %
Lymphs Abs: 3.7 10*3/uL (ref 0.7–4.0)
MCH: 29.5 pg (ref 26.0–34.0)
MCHC: 35.3 g/dL (ref 30.0–36.0)
MCV: 83.6 fL (ref 80.0–100.0)
Monocytes Absolute: 1.4 10*3/uL — ABNORMAL HIGH (ref 0.1–1.0)
Monocytes Relative: 14 %
Neutro Abs: 4 10*3/uL (ref 1.7–7.7)
Neutrophils Relative %: 42 %
Platelets: 553 10*3/uL — ABNORMAL HIGH (ref 150–400)
RBC: 2.81 MIL/uL — ABNORMAL LOW (ref 3.87–5.11)
RDW: 15.5 % (ref 11.5–15.5)
WBC: 9.4 10*3/uL (ref 4.0–10.5)
nRBC: 1.2 % — ABNORMAL HIGH (ref 0.0–0.2)

## 2020-05-04 LAB — I-STAT BETA HCG BLOOD, ED (MC, WL, AP ONLY): I-stat hCG, quantitative: <5 m[IU]/mL (ref <5)

## 2020-05-04 LAB — COMPREHENSIVE METABOLIC PANEL
ALT: 25 U/L (ref 0–44)
AST: 23 U/L (ref 15–41)
Albumin: 4.3 g/dL (ref 3.5–5.0)
Alkaline Phosphatase: 56 U/L (ref 38–126)
Anion gap: 8 (ref 5–15)
BUN: 9 mg/dL (ref 6–20)
CO2: 26 mmol/L (ref 22–32)
Calcium: 9 mg/dL (ref 8.9–10.3)
Chloride: 109 mmol/L (ref 98–111)
Creatinine, Ser: 0.48 mg/dL (ref 0.44–1.00)
GFR calc Af Amer: >60 mL/min (ref >60)
GFR calc non Af Amer: >60 mL/min (ref >60)
Glucose, Bld: 100 mg/dL — ABNORMAL HIGH (ref 70–99)
Potassium: 3.5 mmol/L (ref 3.5–5.1)
Sodium: 143 mmol/L (ref 135–145)
Total Bilirubin: 1.4 mg/dL — ABNORMAL HIGH (ref 0.3–1.2)
Total Protein: 7.7 g/dL (ref 6.5–8.1)

## 2020-05-04 LAB — RETICULOCYTES
Immature Retic Fract: 41.9 % — ABNORMAL HIGH (ref 2.3–15.9)
RBC.: 2.85 MIL/uL — ABNORMAL LOW (ref 3.87–5.11)
Retic Count, Absolute: 246 10*3/uL — ABNORMAL HIGH (ref 19.0–186.0)
Retic Ct Pct: 8.6 % — ABNORMAL HIGH (ref 0.4–3.1)

## 2020-05-04 LAB — SARS CORONAVIRUS 2 BY RT PCR (HOSPITAL ORDER, PERFORMED IN ~~LOC~~ HOSPITAL LAB): SARS Coronavirus 2: NEGATIVE

## 2020-05-04 MED ORDER — SODIUM CHLORIDE 0.45 % IV SOLN: INTRAVENOUS | Status: DC

## 2020-05-04 MED ORDER — ENOXAPARIN SODIUM 40 MG/0.4ML ~~LOC~~ SOLN
40.0000 mg | SUBCUTANEOUS | Status: DC
  Filled 2020-05-04: qty 0.4

## 2020-05-04 MED ORDER — KETOROLAC TROMETHAMINE 15 MG/ML IJ SOLN
15.0000 mg | Freq: Four times a day (QID) | INTRAMUSCULAR | Status: DC
  Administered 2020-05-04 – 2020-05-06 (×6): 15 mg via INTRAVENOUS
  Filled 2020-05-04 (×6): qty 1

## 2020-05-04 MED ORDER — CITALOPRAM HYDROBROMIDE 20 MG PO TABS
10.0000 mg | ORAL_TABLET | Freq: Every day | ORAL | Status: DC
  Administered 2020-05-04 – 2020-05-06 (×3): 10 mg via ORAL
  Filled 2020-05-04 (×3): qty 1

## 2020-05-04 MED ORDER — POLYETHYLENE GLYCOL 3350 17 G PO PACK
17.0000 g | PACK | Freq: Every day | ORAL | Status: DC | PRN
  Administered 2020-05-05: 17 g via ORAL
  Filled 2020-05-04 (×2): qty 1

## 2020-05-04 MED ORDER — SENNOSIDES-DOCUSATE SODIUM 8.6-50 MG PO TABS
1.0000 | ORAL_TABLET | Freq: Two times a day (BID) | ORAL | Status: DC
  Administered 2020-05-04 – 2020-05-05 (×2): 1 via ORAL
  Filled 2020-05-04 (×4): qty 1

## 2020-05-04 MED ORDER — DIPHENHYDRAMINE HCL 25 MG PO CAPS
25.0000 mg | ORAL_CAPSULE | ORAL | Status: DC | PRN
  Administered 2020-05-05: 25 mg via ORAL

## 2020-05-04 MED ORDER — ONDANSETRON HCL 4 MG/2ML IJ SOLN
4.0000 mg | INTRAMUSCULAR | Status: DC | PRN
  Administered 2020-05-04 (×2): 4 mg via INTRAVENOUS
  Filled 2020-05-04 (×2): qty 2

## 2020-05-04 MED ORDER — DIPHENHYDRAMINE HCL 25 MG PO CAPS
25.0000 mg | ORAL_CAPSULE | ORAL | Status: DC | PRN
  Administered 2020-05-04: 50 mg via ORAL
  Filled 2020-05-04: qty 1
  Filled 2020-05-04: qty 2

## 2020-05-04 MED ORDER — ONDANSETRON HCL 4 MG/2ML IJ SOLN: 4.0000 mg | Freq: Four times a day (QID) | INTRAMUSCULAR | Status: DC | PRN

## 2020-05-04 MED ORDER — KETOROLAC TROMETHAMINE 30 MG/ML IJ SOLN
15.0000 mg | Freq: Once | INTRAMUSCULAR | Status: AC
  Administered 2020-05-04: 15 mg via INTRAVENOUS
  Filled 2020-05-04: qty 1

## 2020-05-04 MED ORDER — HYDROMORPHONE 1 MG/ML IV SOLN
INTRAVENOUS | Status: DC
  Administered 2020-05-04: 30 mg via INTRAVENOUS
  Administered 2020-05-04: 6.4 mg via INTRAVENOUS
  Administered 2020-05-05: 15 mg via INTRAVENOUS
  Administered 2020-05-05: 6.6 mg via INTRAVENOUS
  Administered 2020-05-05: 7 mg via INTRAVENOUS
  Filled 2020-05-04 (×2): qty 30

## 2020-05-04 MED ORDER — FOLIC ACID 1 MG PO TABS
1.0000 mg | ORAL_TABLET | Freq: Every day | ORAL | Status: DC
  Administered 2020-05-04 – 2020-05-06 (×3): 1 mg via ORAL
  Filled 2020-05-04 (×3): qty 1

## 2020-05-04 MED ORDER — ASPIRIN EC 81 MG PO TBEC: 81.0000 mg | DELAYED_RELEASE_TABLET | Freq: Every day | ORAL | Status: DC

## 2020-05-04 MED ORDER — HYDROMORPHONE HCL 2 MG/ML IJ SOLN
2.0000 mg | INTRAMUSCULAR | Status: AC
  Administered 2020-05-04: 2 mg via INTRAVENOUS
  Filled 2020-05-04: qty 1

## 2020-05-04 MED ORDER — SODIUM CHLORIDE 0.9% FLUSH: 3.0000 mL | Freq: Once | INTRAVENOUS | Status: DC

## 2020-05-04 MED ORDER — HYDROMORPHONE HCL 1 MG/ML IJ SOLN
2.0000 mg | Freq: Once | INTRAMUSCULAR | Status: AC
  Administered 2020-05-04: 2 mg via INTRAVENOUS
  Filled 2020-05-04: qty 2

## 2020-05-04 MED ORDER — MORPHINE SULFATE ER 30 MG PO TBCR
60.0000 mg | EXTENDED_RELEASE_TABLET | Freq: Two times a day (BID) | ORAL | Status: DC
  Administered 2020-05-04 – 2020-05-06 (×5): 60 mg via ORAL
  Filled 2020-05-04 (×5): qty 2

## 2020-05-04 MED ORDER — NALOXONE HCL 0.4 MG/ML IJ SOLN: 0.4000 mg | INTRAMUSCULAR | Status: DC | PRN

## 2020-05-04 MED ORDER — LORATADINE 10 MG PO TABS
10.0000 mg | ORAL_TABLET | Freq: Every day | ORAL | Status: DC
  Administered 2020-05-04 – 2020-05-06 (×3): 10 mg via ORAL
  Filled 2020-05-04 (×3): qty 1

## 2020-05-04 MED ORDER — OXYCODONE HCL 5 MG PO TABS
10.0000 mg | ORAL_TABLET | Freq: Four times a day (QID) | ORAL | Status: DC | PRN
  Administered 2020-05-04 – 2020-05-05 (×2): 10 mg via ORAL
  Filled 2020-05-04 (×2): qty 2

## 2020-05-04 MED ORDER — SODIUM CHLORIDE 0.9% FLUSH: 9.0000 mL | INTRAVENOUS | Status: DC | PRN

## 2020-05-04 NOTE — H&P (Addendum)
H&P  Patient Demographics:  Carmen [name], is a 29 y.o. female  MRN: [id number]   DOB - 1991/02/06  Admit Date - 05/04/2020  Outpatient Primary MD for the patient is Vevelyn Francois, NP  Chief Complaint  Patient presents with  . Sickle Cell Pain Crisis      HPI:   Carmen [name]  is a 29 y.o. female with a medical history significant for sickle cell disease, chronic pain syndrome, opiate dependence and tolerance, and history of anemia of chronic disease that presents complaining of generalized pain that is consistent with her typical pain crisis.  Patient was previously discharged on 05/02/2020 following a sickle cell pain crisis.  Patient attributes current crisis to menstrual cycle.  She states that menses are very heavy and typically trigger pain crisis.  Also, patient attributes pain crisis to being out of MS Contin over the past 2 days she was unable to pick up medication being that it needs a prior authorization.  She last had oxycodone this a.m. without sustained relief.  Patient characterizes pain as constant and throbbing.  She also endorses abdominal cramping.   Pain intensity is 8/10 characterized as constant and throbbing.  She denies headache, dizziness, paresthesias, urinary symptoms, nausea, vomiting, or diarrhea.  Patient also denies sick contacts, recent travel, or exposure to COVID-19.    ER course:  Patient found to have hemoglobin of 8.3, which is consistent with her baseline.  All of the laboratory values unremarkable.  Patient's vital signs show BP 129/65 (BP Location: Left Arm)   Pulse 66   Temp 98.2 F (36.8 C) (Oral)   Resp 14   Ht 5\' 3"  (1.6 m)   Wt 81.6 kg   LMP 04/15/2020   SpO2 98%   BMI 31.89 kg/m .  Pain persists despite IV Dilaudid, IV fluids, and IV Toradol, admit to MedSurg and observation for further management of pain crisis.    Review of systems:  In addition to the HPI above, patient reports Review of Systems  Constitutional: Negative.  Negative for  chills and fever.  HENT: Negative.   Eyes: Negative.   Respiratory: Negative.   Cardiovascular: Negative.   Gastrointestinal: Negative.   Genitourinary: Negative for frequency and hematuria.  Musculoskeletal: Positive for back pain and joint pain.  Skin: Negative.   Neurological: Negative.  Negative for seizures and weakness.     A full 10 point Review of Systems was done, except as stated above, all other Review of Systems were negative.  With Past History of the following :   Past Medical History:  Diagnosis Date  . Headache(784.0)   . Sickle cell crisis ([place])   . Syphilis 2015   Was diagnosed and received one injection of antibiotics  . Thrombocytosis ([place]) 11/22/2014     CBC Latest Ref Rng & Units 12/13/2018 12/11/2018 12/10/2018 WBC 4.0 - 10.5 K/uL 14.8(H) 13.2(H) 15.9(H) Hemoglobin 12.0 - 15.0 g/dL 8.2(L) 7.7(L) 8.1(L) Hematocrit 36.0 - 46.0 % 23.7(L) 23.2(L) 24.5(L) Platelets 150 - 400 K/uL 326 399 388        Past Surgical History:  Procedure Laterality Date  . CESAREAN SECTION N/A 02/05/2019   Procedure: CESAREAN SECTION;  Surgeon: [name], MD;  Location: [facility] LD ORS;  Service: Obstetrics;  Laterality: N/A;  . CHOLECYSTECTOMY N/A 11/30/2014   Procedure: LAPAROSCOPIC CHOLECYSTECTOMY SINGLE SITE WITH INTRAOPERATIVE CHOLANGIOGRAM;  Surgeon: Michael Boston, MD;  Location: [facility] ORS;  Service: General;  Laterality: N/A;  . SPLENECTOMY  Social History:   Social History   Tobacco Use  . Smoking status: Current Some Day Smoker    Types: Cigarettes  . Smokeless tobacco: Never Used  Substance Use Topics  . Alcohol use: No     Lives - At home   Family History :   Family History  Problem Relation Age of Onset  . Hypertension Mother   . Sickle cell anemia Sister   . Kidney disease Sister        Lupus  . Arthritis Sister   . Sickle cell anemia Sister   . Sickle cell trait Sister   . Heart disease Maternal Aunt        CABG  . Heart disease Maternal Uncle         CABG  . Lupus Sister      Home Medications:   Prior to Admission medications   Medication Sig Start Date End Date Taking? Authorizing Provider  aspirin EC 81 MG tablet Take 1 tablet (81 mg total) by mouth daily. 05/02/20   [name], FNP  citalopram (CELEXA) 10 MG tablet Take 1 tablet (10 mg total) by mouth daily. 03/28/20 06/26/20  Vevelyn Francois, NP  cyclobenzaprine (FLEXERIL) 10 MG tablet Take 1 tablet (10 mg total) by mouth 2 (two) times daily. 03/28/20 06/26/20  Vevelyn Francois, NP  folic acid (FOLVITE) 1 MG tablet Take 1 tablet (1 mg total) by mouth daily. 03/15/19   Lanae Boast, FNP  loratadine (CLARITIN) 10 MG tablet Take 1 tablet (10 mg total) by mouth daily. Patient taking differently: Take 10 mg by mouth daily as needed for allergies.  03/28/20   Vevelyn Francois, NP  morphine (MS CONTIN) 60 MG 12 hr tablet Take 1 tablet (60 mg total) by mouth every 12 (twelve) hours. 05/01/20 05/31/20  Azzie Glatter, FNP  morphine (MS CONTIN) 60 MG 12 hr tablet Take 1 tablet (60 mg total) by mouth every 12 (twelve) hours. 05/02/20   [name], FNP  Oxycodone HCl 10 MG TABS Take 1 tablet (10 mg total) by mouth every 6 (six) hours as needed for up to 15 days (PAIN). 05/06/20 05/21/20  Azzie Glatter, FNP  Oxycodone HCl 10 MG TABS Take 1 tablet (10 mg total) by mouth every 6 (six) hours as needed (PAIN). 05/02/20   [name], FNP  Prenatal Vit-Fe Fumarate-FA (PRENATAL VITAMIN PLUS LOW IRON) 27-1 MG TABS TAKE 1 TABLET BY MOUTH EVERY DAY Patient taking differently: Take 1 tablet by mouth daily.  03/03/20   Shelly Bombard, MD  Vitamin D, Ergocalciferol, (DRISDOL) 1.25 MG (50000 UT) CAPS capsule TAKE ONE CAPSULE BY MOUTH ONE TIME PER WEEK Patient taking differently: Take 50,000 Units by mouth every 7 (seven) days.  10/11/19   Azzie Glatter, FNP     Allergies:   No Known Allergies   Physical Exam:   Vitals:   Vitals:   05/04/20 0858  BP: 128/89  Pulse: (!) 104  Resp: 20   Temp: 98 F (36.7 C)  SpO2: 97%    Physical Exam: Constitutional: Patient appears well-developed and well-nourished. Not in obvious distress. HENT: Normocephalic, atraumatic, External right and left ear normal. Oropharynx is clear and moist.  Eyes: Conjunctivae and EOM are normal. PERRLA, no scleral icterus. Neck: Normal ROM. Neck supple. No JVD. No tracheal deviation. No thyromegaly. CVS: RRR, S1/S2 +, no murmurs, no gallops, no carotid bruit.  Pulmonary: Effort and breath sounds normal, no stridor, rhonchi, wheezes,  rales.  Abdominal: Soft. BS +, no distension, tenderness, rebound or guarding.  Musculoskeletal: Normal range of motion. No edema and no tenderness.  Lymphadenopathy: No lymphadenopathy noted, cervical, inguinal or axillary Neuro: Alert. Normal reflexes, muscle tone coordination. No cranial nerve deficit. Skin: Skin is warm and dry. No rash noted. Not diaphoretic. No erythema. No pallor. Psychiatric: Normal mood and affect. Behavior, judgment, thought content normal.   Data Review:   CBC Recent Labs  Lab 04/29/20 0430 04/30/20 0612 05/02/20 0526 05/04/20 0900  WBC 10.9* 9.2 11.5* 9.4  HGB 9.5* 8.8* 8.5* 8.3*  HCT 27.2* 25.0* 23.6* 23.5*  PLT 572* 484* 464* 553*  MCV 84.5 83.3 82.2 83.6  MCH 29.5 29.3 29.6 29.5  MCHC 34.9 35.2 36.0 35.3  RDW 14.8 15.1 15.5 15.5  LYMPHSABS 2.5  --   --  3.7  MONOABS 1.2*  --   --  1.4*  EOSABS 0.1  --   --  0.2  BASOSABS 0.0  --   --  0.1   ------------------------------------------------------------------------------------------------------------------  Chemistries  Recent Labs  Lab 04/29/20 0430 04/30/20 0612 05/04/20 0900  NA 135 136 143  K 5.5* 4.2 3.5  CL 101 103 109  CO2 20* 24 26  GLUCOSE 86 87 100*  BUN 10 10 9   CREATININE 0.74 0.48 0.48  CALCIUM 9.4 9.2 9.0  AST 68*  --  23  ALT 31  --  25  ALKPHOS 61  --  56  BILITOT 1.1  --  1.4*    ------------------------------------------------------------------------------------------------------------------ estimated creatinine clearance is 106.9 mL/min (by C-G formula based on SCr of 0.48 mg/dL). ------------------------------------------------------------------------------------------------------------------ No results for input(s): TSH, T4TOTAL, T3FREE, THYROIDAB in the last 72 hours.  Invalid input(s): FREET3  Coagulation profile No results for input(s): INR, PROTIME in the last 168 hours. ------------------------------------------------------------------------------------------------------------------- No results for input(s): DDIMER in the last 72 hours. -------------------------------------------------------------------------------------------------------------------  Cardiac Enzymes No results for input(s): CKMB, TROPONINI, MYOGLOBIN in the last 168 hours.  Invalid input(s): CK ------------------------------------------------------------------------------------------------------------------ No results found for: BNP  ---------------------------------------------------------------------------------------------------------------  Urinalysis    Component Value Date/Time   COLORURINE YELLOW 12/31/2019 1213   APPEARANCEUR CLEAR 12/31/2019 1213   LABSPEC 1.011 12/31/2019 1213   PHURINE 6.0 12/31/2019 1213   GLUCOSEU NEGATIVE 12/31/2019 1213   HGBUR NEGATIVE 12/31/2019 [place] 12/31/2019 1213   BILIRUBINUR Negative 08/10/2019 1534   KETONESUR NEGATIVE 12/31/2019 1213   PROTEINUR NEGATIVE 12/31/2019 1213   UROBILINOGEN 1.0 08/10/2019 1534   UROBILINOGEN 1.0 01/12/2018 0943   NITRITE NEGATIVE 12/31/2019 1213   LEUKOCYTESUR SMALL (A) 12/31/2019 1213    ----------------------------------------------------------------------------------------------------------------   Imaging Results:    No results found.     Assessment & Plan:  Principal  Problem:   Sickle cell crisis (HCC) Active Problems:   Chronic prescription opiate use  Sickle cell disease with pain crisis:  Admit to MedSurg.  Initiate IV Dilaudid PCA with settings of 0.6 mg, 10-minute lockout, and 4 mg/h. Toradol 15 mg IV every 6 hours for total of 5 days MS Contin 15 mg every 12 hours Oxycodone 10 mg every 6 hours as needed for severe breakthrough pain Monitor vital signs closely, reevaluate pain scale regularly, and supplemental oxygen as needed Pain will be evaluated in context of function and relationship to baseline as patient's care progresses.  Chronic pain syndrome: Continue home medications  Sickle cell anemia: Hemoglobin is 8.3, appears to be consistent with patient's baseline.  There is no clinical indication for blood transfusion at this time.  Continue to follow CBC, repeat in a.m.  DVT Prophylaxis: Subcut Lovenox   AM Labs Ordered, also please review Full Orders  Family Communication: Admission, patient's condition and plan of care including tests being ordered have been discussed with the patient who indicate understanding and agree with the plan and Code Status.  Code Status: Full Code  Consults called: None    Admission status: Inpatient    Time spent in minutes : 40 minutes  [place], MSN, FNP-C [facility] [address] [place], [place] [address] [phone]  05/04/2020 at 12:40 PM

## 2020-05-04 NOTE — Progress Notes (Signed)
Report  Received from ED

## 2020-05-04 NOTE — ED Triage Notes (Signed)
BIB EMS with SCC pain all over. 120/82-84-16-97%

## 2020-05-04 NOTE — ED Provider Notes (Signed)
[facility] DEPT Provider Note   CSN: [id number] Arrival date & time: 05/04/20  0841     History Chief Complaint  Patient presents with  . Sickle Cell Pain Crisis    Carmen [name] is a 29 y.o. female.  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  Patient was discharged from the hospital a few days ago after an admission for a pain crisis.  States that her pain is in her back and legs and has been persistent in nature.  No fever cough or congestion.  Pain unrelieved with her home medications        Past Medical History:  Diagnosis Date  . Headache(784.0)   . Sickle cell crisis ([place])   . Syphilis 2015   Was diagnosed and received one injection of antibiotics  . Thrombocytosis ([place]) 11/22/2014     CBC Latest Ref Rng & Units 12/13/2018 12/11/2018 12/10/2018 WBC 4.0 - 10.5 K/uL 14.8(H) 13.2(H) 15.9(H) Hemoglobin 12.0 - 15.0 g/dL 8.2(L) 7.7(L) 8.1(L) Hematocrit 36.0 - 46.0 % 23.7(L) 23.2(L) 24.5(L) Platelets 150 - 400 K/uL 326 399 388      Patient Active Problem List   Diagnosis Date Noted  . Sickle cell crisis ([place]) 03/31/2020  . Bacterial vaginitis 01/04/2020  . Acute cystitis without hematuria   . Dysuria 12/31/2019  . Urine leukocytes   . Sickle cell pain crisis ([place]) 09/10/2019  . Sickle cell disease ([place]) 12/04/2018  . Alpha thalassemia silent carrier 12/04/2018  . Chronic prescription opiate use 03/13/2018  . Chronic musculoskeletal pain 03/13/2018  . Vitamin D deficiency 01/13/2018  . Leukocytosis 08/21/2017  . 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 Hb-SS disease without crisis ([place]) 08/15/2016  . Anemia of chronic disease   . Chest pain 11/10/2015  . Systolic ejection murmur 84/69/6295  . Sickle cell anemia of mother during pregnancy [facility]) 05/16/2014    Past Surgical History:  Procedure Laterality Date  . CESAREAN SECTION N/A 02/05/2019   Procedure:  CESAREAN SECTION;  Surgeon: [name], MD;  Location: [facility] LD ORS;  Service: Obstetrics;  Laterality: N/A;  . CHOLECYSTECTOMY N/A 11/30/2014   Procedure: LAPAROSCOPIC CHOLECYSTECTOMY SINGLE SITE WITH INTRAOPERATIVE CHOLANGIOGRAM;  Surgeon: Michael Boston, MD;  Location: [facility] ORS;  Service: General;  Laterality: N/A;  . SPLENECTOMY       OB History    Gravida  2   Para  1   Term  1   Preterm      AB  1   Living  1     SAB      TAB  1   Ectopic      Multiple  0   Live Births  1           Family History  Problem Relation Age of Onset  . Hypertension Mother   . Sickle cell anemia Sister   . Kidney disease Sister        Lupus  . Arthritis Sister   . Sickle cell anemia Sister   . Sickle cell trait Sister   . Heart disease Maternal Aunt        CABG  . Heart disease Maternal Uncle        CABG  . Lupus Sister     Social History   Tobacco Use  . Smoking status: Current Some Day Smoker    Types: Cigarettes  . Smokeless tobacco: Never Used  Vaping Use  . Vaping Use: Never used  Substance Use Topics  . Alcohol use: No  . Drug use: No    Home Medications Prior to Admission medications   Medication Sig Start Date End Date Taking? Authorizing Provider  aspirin EC 81 MG tablet Take 1 tablet (81 mg total) by mouth daily. 05/02/20   [name], FNP  citalopram (CELEXA) 10 MG tablet Take 1 tablet (10 mg total) by mouth daily. 03/28/20 06/26/20  Vevelyn Francois, NP  cyclobenzaprine (FLEXERIL) 10 MG tablet Take 1 tablet (10 mg total) by mouth 2 (two) times daily. 03/28/20 06/26/20  Vevelyn Francois, NP  folic acid (FOLVITE) 1 MG tablet Take 1 tablet (1 mg total) by mouth daily. 03/15/19   Lanae Boast, FNP  loratadine (CLARITIN) 10 MG tablet Take 1 tablet (10 mg total) by mouth daily. Patient taking differently: Take 10 mg by mouth daily as needed for allergies.  03/28/20   Vevelyn Francois, NP  morphine (MS CONTIN) 60 MG 12 hr tablet Take 1 tablet (60 mg total) by  mouth every 12 (twelve) hours. 05/01/20 05/31/20  Azzie Glatter, FNP  morphine (MS CONTIN) 60 MG 12 hr tablet Take 1 tablet (60 mg total) by mouth every 12 (twelve) hours. 05/02/20   [name], FNP  Oxycodone HCl 10 MG TABS Take 1 tablet (10 mg total) by mouth every 6 (six) hours as needed for up to 15 days (PAIN). 05/06/20 05/21/20  Azzie Glatter, FNP  Oxycodone HCl 10 MG TABS Take 1 tablet (10 mg total) by mouth every 6 (six) hours as needed (PAIN). 05/02/20   [name], FNP  Prenatal Vit-Fe Fumarate-FA (PRENATAL VITAMIN PLUS LOW IRON) 27-1 MG TABS TAKE 1 TABLET BY MOUTH EVERY DAY Patient taking differently: Take 1 tablet by mouth daily.  03/03/20   Shelly Bombard, MD  Vitamin D, Ergocalciferol, (DRISDOL) 1.25 MG (50000 UT) CAPS capsule TAKE ONE CAPSULE BY MOUTH ONE TIME PER WEEK Patient taking differently: Take 50,000 Units by mouth every 7 (seven) days.  10/11/19   Azzie Glatter, FNP    Allergies    Patient has no known allergies.  Review of Systems   Review of Systems  All other systems reviewed and are negative.   Physical Exam Updated Vital Signs BP 128/89 (BP Location: Left Arm)   Pulse (!) 104   Temp 98 F (36.7 C) (Oral)   Resp 20   Ht 1.6 m (5\' 3" )   Wt 81.6 kg   LMP 04/15/2020   SpO2 97%   BMI 31.89 kg/m   Physical Exam Vitals and nursing note reviewed.  Constitutional:      General: She is not in acute distress.    Appearance: Normal appearance. She is well-developed. She is not toxic-appearing.  HENT:     Head: Normocephalic and atraumatic.  Eyes:     General: Lids are normal.     Conjunctiva/sclera: Conjunctivae normal.     Pupils: Pupils are equal, round, and reactive to light.  Neck:     Thyroid: No thyroid mass.     Trachea: No tracheal deviation.  Cardiovascular:     Rate and Rhythm: Normal rate and regular rhythm.     Heart sounds: Normal heart sounds. No murmur heard.  No gallop.   Pulmonary:     Effort: Pulmonary effort is  normal. No respiratory distress.     Breath sounds: Normal breath sounds. No stridor. No decreased breath  sounds, wheezing, rhonchi or rales.  Abdominal:     General: Bowel sounds are normal. There is no distension.     Palpations: Abdomen is soft.     Tenderness: There is no abdominal tenderness. There is no rebound.  Musculoskeletal:        General: No tenderness. Normal range of motion.     Cervical back: Normal range of motion and neck supple.  Skin:    General: Skin is warm and dry.     Findings: No abrasion or rash.  Neurological:     Mental Status: She is alert and oriented to person, place, and time.     GCS: GCS eye subscore is 4. GCS verbal subscore is 5. GCS motor subscore is 6.     Cranial Nerves: No cranial nerve deficit.     Sensory: No sensory deficit.  Psychiatric:        Speech: Speech normal.        Behavior: Behavior normal.     ED Results / Procedures / Treatments   Labs (all labs ordered are listed, but only abnormal results are displayed) Labs Reviewed  COMPREHENSIVE METABOLIC PANEL  CBC WITH DIFFERENTIAL/PLATELET  RETICULOCYTES  I-STAT BETA HCG BLOOD, ED ([facility], [facility], [facility] ONLY)    EKG None  Radiology No results found.  Procedures Procedures (including critical care time)  Medications Ordered in ED Medications  sodium chloride flush (NS) 0.9 % injection 3 mL (has no administration in time range)    ED Course  I have reviewed the triage vital signs and the nursing notes.  Pertinent labs & imaging results that were available during my care of the patient were reviewed by me and considered in my medical decision making (see chart for details).    MDM Rules/Calculators/A&P                         Patient given IV fluids as well as IV hydromorphone.  Also given IV Toradol continues to note severe pain and discussed with sickle cell team and they will admit Final Clinical Impression(s) / ED Diagnoses Final diagnoses:  None    Rx / DC Orders ED  Discharge Orders    None       Lacretia Leigh, MD 05/04/20 1226

## 2020-05-05 DIAGNOSIS — Z9081 Acquired absence of spleen: Secondary | ICD-10-CM | POA: Diagnosis not present

## 2020-05-05 DIAGNOSIS — Z20822 Contact with and (suspected) exposure to covid-19: Secondary | ICD-10-CM | POA: Diagnosis present

## 2020-05-05 DIAGNOSIS — Z832 Family history of diseases of the blood and blood-forming organs and certain disorders involving the immune mechanism: Secondary | ICD-10-CM | POA: Diagnosis not present

## 2020-05-05 DIAGNOSIS — F17210 Nicotine dependence, cigarettes, uncomplicated: Secondary | ICD-10-CM | POA: Diagnosis present

## 2020-05-05 DIAGNOSIS — Z841 Family history of disorders of kidney and ureter: Secondary | ICD-10-CM | POA: Diagnosis not present

## 2020-05-05 DIAGNOSIS — Z79891 Long term (current) use of opiate analgesic: Secondary | ICD-10-CM | POA: Diagnosis not present

## 2020-05-05 DIAGNOSIS — D638 Anemia in other chronic diseases classified elsewhere: Secondary | ICD-10-CM | POA: Diagnosis not present

## 2020-05-05 DIAGNOSIS — G894 Chronic pain syndrome: Secondary | ICD-10-CM | POA: Diagnosis present

## 2020-05-05 DIAGNOSIS — Z9049 Acquired absence of other specified parts of digestive tract: Secondary | ICD-10-CM | POA: Diagnosis not present

## 2020-05-05 DIAGNOSIS — Z8249 Family history of ischemic heart disease and other diseases of the circulatory system: Secondary | ICD-10-CM | POA: Diagnosis not present

## 2020-05-05 DIAGNOSIS — Z8261 Family history of arthritis: Secondary | ICD-10-CM | POA: Diagnosis not present

## 2020-05-05 DIAGNOSIS — Z7982 Long term (current) use of aspirin: Secondary | ICD-10-CM | POA: Diagnosis not present

## 2020-05-05 DIAGNOSIS — D5700 Hb-SS disease with crisis, unspecified: Secondary | ICD-10-CM | POA: Diagnosis present

## 2020-05-05 DIAGNOSIS — Z79899 Other long term (current) drug therapy: Secondary | ICD-10-CM | POA: Diagnosis not present

## 2020-05-05 LAB — BASIC METABOLIC PANEL
Anion gap: 10 (ref 5–15)
BUN: 7 mg/dL (ref 6–20)
CO2: 26 mmol/L (ref 22–32)
Calcium: 9.3 mg/dL (ref 8.9–10.3)
Chloride: 103 mmol/L (ref 98–111)
Creatinine, Ser: 0.4 mg/dL — ABNORMAL LOW (ref 0.44–1.00)
GFR calc Af Amer: >60 mL/min (ref >60)
GFR calc non Af Amer: >60 mL/min (ref >60)
Glucose, Bld: 89 mg/dL (ref 70–99)
Potassium: 3.7 mmol/L (ref 3.5–5.1)
Sodium: 139 mmol/L (ref 135–145)

## 2020-05-05 LAB — CBC
HCT: 22.6 % — ABNORMAL LOW (ref 36.0–46.0)
Hemoglobin: 7.9 g/dL — ABNORMAL LOW (ref 12.0–15.0)
MCH: 29.6 pg (ref 26.0–34.0)
MCHC: 35 g/dL (ref 30.0–36.0)
MCV: 84.6 fL (ref 80.0–100.0)
Platelets: 534 10*3/uL — ABNORMAL HIGH (ref 150–400)
RBC: 2.67 MIL/uL — ABNORMAL LOW (ref 3.87–5.11)
RDW: 15.9 % — ABNORMAL HIGH (ref 11.5–15.5)
WBC: 10.8 10*3/uL — ABNORMAL HIGH (ref 4.0–10.5)
nRBC: 1.5 % — ABNORMAL HIGH (ref 0.0–0.2)

## 2020-05-05 MED ORDER — HYDROMORPHONE 1 MG/ML IV SOLN
INTRAVENOUS | Status: DC
  Administered 2020-05-05: 5 mg via INTRAVENOUS
  Administered 2020-05-05: 1 mg via INTRAVENOUS
  Administered 2020-05-05: 30 mg via INTRAVENOUS
  Administered 2020-05-05: 6 mg via INTRAVENOUS
  Administered 2020-05-05: 3.4 mg via INTRAVENOUS
  Administered 2020-05-06: 6 mg via INTRAVENOUS
  Filled 2020-05-05: qty 30

## 2020-05-05 MED ORDER — ACETAMINOPHEN 325 MG PO TABS
650.0000 mg | ORAL_TABLET | ORAL | Status: DC | PRN
  Administered 2020-05-05: 650 mg via ORAL
  Filled 2020-05-05: qty 2

## 2020-05-05 NOTE — Progress Notes (Signed)
Subjective: Carmen [name] is a 29 year old female with a medical history significant for sickle cell disease, with a medical history of sickle cell disease, chronic pain syndrome, opiate dependence and tolerance, and history of anemia of chronic disease was admitted for sickle cell pain crisis.  Patient continues to have significant pain primarily to low back.  Pain intensity is 8/10.  She endorses headache.  She denies chest pain, shortness of breath, urinary symptoms, nausea, vomiting, or diarrhea.  Objective:  Vital signs in last 24 hours:  Vitals:   05/05/20 0818 05/05/20 0957 05/05/20 1224 05/05/20 1311  BP:  137/84  120/71  Pulse:  71  62  Resp: 16 14 20 15   Temp:  98.2 F (36.8 C)  97.8 F (36.6 C)  TempSrc:  Oral  Oral  SpO2: 98% 99% 98% 96%  Weight:      Height:        Intake/Output from previous day:   Intake/Output Summary (Last 24 hours) at 05/05/2020 1321 Last data filed at 05/05/2020 1009 Gross per 24 hour  Intake 1204.84 ml  Output 300 ml  Net 904.84 ml    Physical Exam: General: Alert, awake, oriented x3, in no acute distress.  HEENT: [place]/AT PEERL, EOMI Neck: Trachea midline,  no masses, no thyromegal,y no JVD, no carotid bruit OROPHARYNX:  Moist, No exudate/ erythema/lesions.  Heart: Regular rate and rhythm, without murmurs, rubs, gallops, PMI non-displaced, no heaves or thrills on palpation.  Lungs: Clear to auscultation, no wheezing or rhonchi noted. No increased vocal fremitus resonant to percussion  Abdomen: Soft, nontender, nondistended, positive bowel sounds, no masses no hepatosplenomegaly noted..  Neuro: No focal neurological deficits noted cranial nerves II through XII grossly intact. DTRs 2+ bilaterally upper and lower extremities. Strength 5 out of 5 in bilateral upper and lower extremities. Musculoskeletal: No warm swelling or erythema around joints, no spinal tenderness noted. Psychiatric: Patient alert and oriented x3, good insight and cognition,  good recent to remote recall. Lymph node survey: No cervical axillary or inguinal lymphadenopathy noted.  Lab Results:  Basic Metabolic Panel:    Component Value Date/Time   NA 143 05/04/2020 0900   NA 135 03/28/2020 1542   K 3.5 05/04/2020 0900   CL 109 05/04/2020 0900   CO2 26 05/04/2020 0900   BUN 9 05/04/2020 0900   BUN 9 03/28/2020 1542   CREATININE 0.48 05/04/2020 0900   GLUCOSE 100 (H) 05/04/2020 0900   CALCIUM 9.0 05/04/2020 0900   CBC:    Component Value Date/Time   WBC 9.4 05/04/2020 0900   HGB 8.3 (L) 05/04/2020 0900   HGB 9.8 (L) 03/28/2020 1542   HCT 23.5 (L) 05/04/2020 0900   HCT 29.8 (L) 03/28/2020 1542   PLT 553 (H) 05/04/2020 0900   PLT 516 (H) 03/28/2020 1542   MCV 83.6 05/04/2020 0900   MCV 89 03/28/2020 1542   NEUTROABS 4.0 05/04/2020 0900   NEUTROABS 5.6 03/28/2020 1542   LYMPHSABS 3.7 05/04/2020 0900   LYMPHSABS 4.6 (H) 03/28/2020 1542   MONOABS 1.4 (H) 05/04/2020 0900   EOSABS 0.2 05/04/2020 0900   EOSABS 0.2 03/28/2020 1542   BASOSABS 0.1 05/04/2020 0900   BASOSABS 0.1 03/28/2020 1542    Recent Results (from the past 240 hour(s))  SARS CORONAVIRUS 2 (TAT 6-24 HRS) Nasopharyngeal Nasopharyngeal Swab     Status: None   Collection Time: 04/29/20  4:34 PM   Specimen: Nasopharyngeal Swab  Result Value Ref Range Status   SARS Coronavirus 2  NEGATIVE NEGATIVE Final    Comment: (NOTE) SARS-CoV-2 target nucleic acids are NOT DETECTED.  The SARS-CoV-2 RNA is generally detectable in upper and lower respiratory specimens during the acute phase of infection. Negative results do not preclude SARS-CoV-2 infection, do not rule out co-infections with other pathogens, and should not be used as the sole basis for treatment or other patient management decisions. Negative results must be combined with clinical observations, patient history, and epidemiological information. The expected result is Negative.  Fact Sheet for  Patients: SugarRoll.be  Fact Sheet for Healthcare Providers: https://www.woods-mathews.com/  This test is not yet approved or cleared by the [place] FDA and  has been authorized for detection and/or diagnosis of SARS-CoV-2 by FDA under an Emergency Use Authorization (EUA). This EUA will remain  in effect (meaning this test can be used) for the duration of the COVID-19 declaration under Se ction 564(b)(1) of the Act, 21 U.S.C. section 360bbb-3(b)(1), unless the authorization is terminated or revoked sooner.  Performed at [facility], [place] [address], [place], [place] [address]   SARS Coronavirus 2 by RT PCR (hospital order, performed in [facility] hospital lab) Nasopharyngeal Nasopharyngeal Swab     Status: None   Collection Time: 05/04/20 12:50 PM   Specimen: Nasopharyngeal Swab  Result Value Ref Range Status   SARS Coronavirus 2 NEGATIVE NEGATIVE Final    Comment: (NOTE) SARS-CoV-2 target nucleic acids are NOT DETECTED.  The SARS-CoV-2 RNA is generally detectable in upper and lower respiratory specimens during the acute phase of infection. The lowest concentration of SARS-CoV-2 viral copies this assay can detect is 250 copies / mL. A negative result does not preclude SARS-CoV-2 infection and should not be used as the sole basis for treatment or other patient management decisions.  A negative result may occur with improper specimen collection / handling, submission of specimen other than nasopharyngeal swab, presence of viral mutation(s) within the areas targeted by this assay, and inadequate number of viral copies (<250 copies / mL). A negative result must be combined with clinical observations, patient history, and epidemiological information.  Fact Sheet for Patients:   StrictlyIdeas.no  Fact Sheet for Healthcare Providers: BankingDealers.co.za  This test is not yet  approved or  cleared by the [place] FDA and has been authorized for detection and/or diagnosis of SARS-CoV-2 by FDA under an Emergency Use Authorization (EUA).  This EUA will remain in effect (meaning this test can be used) for the duration of the COVID-19 declaration under Section 564(b)(1) of the Act, 21 U.S.C. section 360bbb-3(b)(1), unless the authorization is terminated or revoked sooner.  Performed at [facility], [place] [address], [place], [place] [address]     Studies/Results: No results found.  Medications: Scheduled Meds: . citalopram  10 mg Oral Daily  . enoxaparin (LOVENOX) injection  40 mg Subcutaneous Q24H  . folic acid  1 mg Oral Daily  . HYDROmorphone   Intravenous Q4H  . ketorolac  15 mg Intravenous Q6H  . loratadine  10 mg Oral Daily  . morphine  60 mg Oral Q12H  . senna-docusate  1 tablet Oral BID   Continuous Infusions: PRN Meds:.acetaminophen, diphenhydrAMINE, diphenhydrAMINE, naloxone **AND** sodium chloride flush, ondansetron, ondansetron (ZOFRAN) IV, oxyCODONE, polyethylene glycol  Consultants:  None  Procedures:  None  Antibiotics:  None  Assessment/Plan: Principal Problem:   Sickle cell crisis (HCC) Active Problems:   Chronic prescription opiate use   Sickle cell pain crisis (HCC)  Sickle cell disease with pain  crisis: Continue IV Dilaudid PCA with settings of 0.5 mg, 10-minute lockout, and 2.5 mg/h. Toradol 15 mg IV every 6 hours for total of 5 days Oxycodone 10 mg every 4 hours as needed for severe breakthrough pain Continue IV fluids at [facility] Monitor vital signs closely, reevaluate pain skills regularly, and supplemental oxygen as needed.  Chronic pain syndrome: Continue home medications  Sickle cell anemia: Hemoglobin consistent with patient's baseline.  No clinical indication for blood transfusion at this time.  Continue to monitor closely.   Code Status: Full Code Family Communication:  N/A Disposition Plan: Not yet ready for discharge  [place], MSN, FNP-C Patient [facility] [address] [place], [place] [address] [phone]  If 5PM-7AM, please contact night-coverage.  05/05/2020, 1:21 PM  LOS: 0 days

## 2020-05-06 ENCOUNTER — Encounter: Payer: Self-pay | Admitting: Family Medicine

## 2020-05-06 DIAGNOSIS — D638 Anemia in other chronic diseases classified elsewhere: Secondary | ICD-10-CM

## 2020-05-06 NOTE — Plan of Care (Signed)
  Problem: Activity: Goal: Risk for activity intolerance will decrease Outcome: Adequate for Discharge   Problem: Pain Managment: Goal: General experience of comfort will improve Outcome: Adequate for Discharge   Problem: Education: Goal: Knowledge of vaso-occlusive preventative measures will improve Outcome: Adequate for Discharge Goal: Awareness of infection prevention will improve Outcome: Adequate for Discharge Goal: Awareness of signs and symptoms of anemia will improve Outcome: Adequate for Discharge Goal: Long-term complications will improve Outcome: Adequate for Discharge   Problem: Self-Care: Goal: Ability to incorporate actions that prevent/reduce pain crisis will improve Outcome: Adequate for Discharge   Problem: Bowel/Gastric: Goal: Gut motility will be maintained Outcome: Adequate for Discharge   Problem: Tissue Perfusion: Goal: Complications related to inadequate tissue perfusion will be avoided or minimized Outcome: Adequate for Discharge   Problem: Respiratory: Goal: Pulmonary complications will be avoided or minimized Outcome: Adequate for Discharge Goal: Acute Chest Syndrome will be identified early to prevent complications Outcome: Adequate for Discharge   Problem: Fluid Volume: Goal: Ability to maintain a balanced intake and output will improve Outcome: Adequate for Discharge   Problem: Sensory: Goal: Pain level will decrease with appropriate interventions Outcome: Adequate for Discharge   Problem: Health Behavior: Goal: Postive changes in compliance with treatment and prescription regimens will improve Outcome: Adequate for Discharge

## 2020-05-06 NOTE — Discharge Summary (Signed)
Physician Discharge Summary  Carmen [name] TTS:[id number] DOB: Nov 23, 1990 DOA: 05/04/2020  PCP: Vevelyn Francois, NP  Admit date: 05/04/2020  Discharge date: 05/06/2020  Discharge Diagnoses:  Principal Problem:   Sickle cell crisis ([place]) Active Problems:   Chronic prescription opiate use   Sickle cell pain crisis [facility])   Discharge Condition: Stable  Disposition:  Pt is discharged home in good condition and is to follow up with Vevelyn Francois, NP this week to have labs evaluated. Carmen [name] is instructed to increase activity slowly and balance with rest for the next few days, and use prescribed medication to complete treatment of pain  Diet: Regular Wt Readings from Last 3 Encounters:  05/06/20 84.2 kg  05/02/20 86.3 kg  04/28/20 81.6 kg    History of present illness:  Carmen [name]  is a 29 y.o. female with a medical history significant for sickle cell disease, chronic pain syndrome, opiate dependence and tolerance, and history of anemia of chronic disease that presents complaining of generalized pain that is consistent with her typical pain crisis.  Patient was previously discharged on 05/02/2020 following a sickle cell pain crisis.  Patient attributes current crisis to menstrual cycle.  She states that menses are very heavy and typically trigger pain crisis.  Also, patient attributes pain crisis to being out of MS Contin over the past 2 days she was unable to pick up medication being that it needs a prior authorization.  She last had oxycodone this a.m. without sustained relief.  Patient characterizes pain as constant and throbbing.  She also endorses abdominal cramping.   Pain intensity is 8/10 characterized as constant and throbbing.  She denies headache, dizziness, paresthesias, urinary symptoms, nausea, vomiting, or diarrhea.  Patient also denies sick contacts, recent travel, or exposure to COVID-19.    ER course:  Patient found to have hemoglobin of 8.3, which is consistent  with her baseline.  All of the laboratory values unremarkable.  Patient's vital signs show BP 129/65 (BP Location: Left Arm)   Pulse 66   Temp 98.2 F (36.8 C) (Oral)   Resp 14   Ht 5\' 3"  (1.6 m)   Wt 81.6 kg   LMP 04/15/2020   SpO2 98%   BMI 31.89 kg/m .  Pain persists despite IV Dilaudid, IV fluids, and IV Toradol, admit to MedSurg and observation for further management of pain crisis.  Hospital Course:  Sickle cell disease with pain crisis: Patient was admitted for sickle cell pain crisis and managed appropriately with IVF, IV Dilaudid via PCA and IV Toradol, as well as other adjunct therapies per sickle cell pain management protocols.  IV Dilaudid PCA was weaned appropriately.  Patient was transitioned to home medications.  Patient advised to follow-up with PCP in 1 week for medication management.  Reviewed all laboratory values, appear to be consistent with patient's baseline. Patient's pain intensity is 5/10 and she is requesting discharge home.  Patient advised to continue to hydrate consistently.  She is alert, oriented, and ambulating without assistance.  Patient is afebrile and oxygen saturation 98% on RA.  Patient was discharged home today in a hemodynamically stable condition.   Discharge Exam: Vitals:   05/06/20 0319 05/06/20 0554  BP:  115/88  Pulse:  76  Resp: 14 15  Temp:  97.9 F (36.6 C)  SpO2: 98% 96%   Vitals:   05/06/20 0205 05/06/20 0319 05/06/20 0554 05/06/20 0556  BP: 111/67  115/88   Pulse: 73  76  Resp: 17 14 15    Temp: 98.4 F (36.9 C)  97.9 F (36.6 C)   TempSrc: Oral  Oral   SpO2: 97% 98% 96%   Weight:    84.2 kg  Height:        General appearance : Awake, alert, not in any distress. Speech Clear. Not toxic looking HEENT: Atraumatic and Normocephalic, pupils equally reactive to light and accomodation Neck: Supple, no JVD. No cervical lymphadenopathy.  Chest: Good air entry bilaterally, no added sounds  CVS: S1 S2 regular, no murmurs.   Abdomen: Bowel sounds present, Non tender and not distended with no gaurding, rigidity or rebound. Extremities: B/L Lower Ext shows no edema, both legs are warm to touch Neurology: Awake alert, and oriented X 3, CN II-XII intact, Non focal Skin: No Rash  Discharge Instructions  Discharge Instructions    Discharge patient   Complete by: As directed    Discharge disposition: 01-Home or Self Care   Discharge patient date: 05/06/2020     Allergies as of 05/06/2020   No Known Allergies     Medication List    TAKE these medications   citalopram 10 MG tablet Commonly known as: CeleXA Take 1 tablet (10 mg total) by mouth daily.   cyclobenzaprine 10 MG tablet Commonly known as: FLEXERIL Take 1 tablet (10 mg total) by mouth 2 (two) times daily.   folic acid 1 MG tablet Commonly known as: FOLVITE Take 1 tablet (1 mg total) by mouth daily.   loratadine 10 MG tablet Commonly known as: CLARITIN Take 1 tablet (10 mg total) by mouth daily. What changed:   when to take this  reasons to take this   morphine 60 MG 12 hr tablet Commonly known as: MS Contin Take 1 tablet (60 mg total) by mouth every 12 (twelve) hours.   Oxycodone HCl 10 MG Tabs Take 1 tablet (10 mg total) by mouth every 6 (six) hours as needed for up to 15 days (PAIN).   Prenatal Vitamin Plus Low Iron 27-1 MG Tabs TAKE 1 TABLET BY MOUTH EVERY DAY   Vitamin D (Ergocalciferol) 1.25 MG (50000 UNIT) Caps capsule Commonly known as: DRISDOL TAKE ONE CAPSULE BY MOUTH ONE TIME PER WEEK What changed: See the new instructions.       The results of significant diagnostics from this hospitalization (including imaging, microbiology, ancillary and laboratory) are listed below for reference.    Significant Diagnostic Studies: DG Chest Port 1 View  Result Date: 04/29/2020 CLINICAL DATA:  Sickle cell crisis with chest pain EXAM: PORTABLE CHEST 1 VIEW COMPARISON:  03/31/2020 FINDINGS: Chronic streaky opacity at the right base  overlapping the diaphragm. There is no edema, consolidation, effusion, or pneumothorax. Normal heart size and mediastinal contours. IMPRESSION: Scarring at the right base. No acute finding when compared with prior. Electronically Signed   By: Monte Fantasia M.D.   On: 04/29/2020 04:52    Microbiology: Recent Results (from the past 240 hour(s))  SARS CORONAVIRUS 2 (TAT 6-24 HRS) Nasopharyngeal Nasopharyngeal Swab     Status: None   Collection Time: 04/29/20  4:34 PM   Specimen: Nasopharyngeal Swab  Result Value Ref Range Status   SARS Coronavirus 2 NEGATIVE NEGATIVE Final    Comment: (NOTE) SARS-CoV-2 target nucleic acids are NOT DETECTED.  The SARS-CoV-2 RNA is generally detectable in upper and lower respiratory specimens during the acute phase of infection. Negative results do not preclude SARS-CoV-2 infection, do not rule out co-infections with other pathogens, and should not  be used as the sole basis for treatment or other patient management decisions. Negative results must be combined with clinical observations, patient history, and epidemiological information. The expected result is Negative.  Fact Sheet for Patients: SugarRoll.be  Fact Sheet for Healthcare Providers: https://www.woods-mathews.com/  This test is not yet approved or cleared by the [place] FDA and  has been authorized for detection and/or diagnosis of SARS-CoV-2 by FDA under an Emergency Use Authorization (EUA). This EUA will remain  in effect (meaning this test can be used) for the duration of the COVID-19 declaration under Se ction 564(b)(1) of the Act, 21 U.S.C. section 360bbb-3(b)(1), unless the authorization is terminated or revoked sooner.  Performed at [facility], [place] [address], [place], [place] [address]   SARS Coronavirus 2 by RT PCR (hospital order, performed in [facility] hospital lab) Nasopharyngeal Nasopharyngeal Swab     Status: None    Collection Time: 05/04/20 12:50 PM   Specimen: Nasopharyngeal Swab  Result Value Ref Range Status   SARS Coronavirus 2 NEGATIVE NEGATIVE Final    Comment: (NOTE) SARS-CoV-2 target nucleic acids are NOT DETECTED.  The SARS-CoV-2 RNA is generally detectable in upper and lower respiratory specimens during the acute phase of infection. The lowest concentration of SARS-CoV-2 viral copies this assay can detect is 250 copies / mL. A negative result does not preclude SARS-CoV-2 infection and should not be used as the sole basis for treatment or other patient management decisions.  A negative result may occur with improper specimen collection / handling, submission of specimen other than nasopharyngeal swab, presence of viral mutation(s) within the areas targeted by this assay, and inadequate number of viral copies (<250 copies / mL). A negative result must be combined with clinical observations, patient history, and epidemiological information.  Fact Sheet for Patients:   StrictlyIdeas.no  Fact Sheet for Healthcare Providers: BankingDealers.co.za  This test is not yet approved or  cleared by the [place] FDA and has been authorized for detection and/or diagnosis of SARS-CoV-2 by FDA under an Emergency Use Authorization (EUA).  This EUA will remain in effect (meaning this test can be used) for the duration of the COVID-19 declaration under Section 564(b)(1) of the Act, 21 U.S.C. section 360bbb-3(b)(1), unless the authorization is terminated or revoked sooner.  Performed at [facility], [place] [address], [place], [place] [address]      Labs: Basic Metabolic Panel: Recent Labs  Lab 04/30/20 0612 04/30/20 0612 05/04/20 0900 05/05/20 1350  NA 136  --  143 139  K 4.2   < > 3.5 3.7  CL 103  --  109 103  CO2 24  --  26 26  GLUCOSE 87  --  100* 89  BUN 10  --  9 7  CREATININE 0.48  --  0.48 0.40*  CALCIUM 9.2  --   9.0 9.3   < > = values in this interval not displayed.   Liver Function Tests: Recent Labs  Lab 05/04/20 0900  AST 23  ALT 25  ALKPHOS 56  BILITOT 1.4*  PROT 7.7  ALBUMIN 4.3   No results for input(s): LIPASE, AMYLASE in the last 168 hours. No results for input(s): AMMONIA in the last 168 hours. CBC: Recent Labs  Lab 04/30/20 0612 05/02/20 0526 05/04/20 0900 05/05/20 1350  WBC 9.2 11.5* 9.4 10.8*  NEUTROABS  --   --  4.0  --   HGB 8.8* 8.5* 8.3* 7.9*  HCT 25.0* 23.6* 23.5* 22.6*  MCV 83.3 82.2 83.6 84.6  PLT 484* 464* 553* 534*   Cardiac Enzymes: No results for input(s): CKTOTAL, CKMB, CKMBINDEX, TROPONINI in the last 168 hours. BNP: Invalid input(s): POCBNP CBG: No results for input(s): GLUCAP in the last 168 hours.  Time coordinating discharge: 40 minutes  Signed:  [name]  APRN, MSN, FNP-C [facility] [address] [place], [place] [address] [phone]  [facility] 05/06/2020, 9:16 AM

## 2020-05-08 ENCOUNTER — Telehealth: Payer: Self-pay | Admitting: *Deleted

## 2020-05-08 NOTE — Telephone Encounter (Addendum)
Attempted to contact pt to complete transition of care assessment; left message on voicemail.  Katrice Kiel Cockerell, RN, BSN, CCRN [facility] [phone]  

## 2020-05-11 ENCOUNTER — Other Ambulatory Visit: Payer: Self-pay

## 2020-05-11 ENCOUNTER — Inpatient Hospital Stay (HOSPITAL_COMMUNITY)
Admission: EM | Admit: 2020-05-11 | Discharge: 2020-05-13 | DRG: 812 | Disposition: A | Discharge status: Home / Self Care | Payer: Medicaid Other | Attending: Internal Medicine | Admitting: Internal Medicine

## 2020-05-11 DIAGNOSIS — Z9049 Acquired absence of other specified parts of digestive tract: Secondary | ICD-10-CM | POA: Diagnosis not present

## 2020-05-11 DIAGNOSIS — Z79899 Other long term (current) drug therapy: Secondary | ICD-10-CM | POA: Diagnosis not present

## 2020-05-11 DIAGNOSIS — M7918 Myalgia, other site: Secondary | ICD-10-CM | POA: Diagnosis present

## 2020-05-11 DIAGNOSIS — G894 Chronic pain syndrome: Secondary | ICD-10-CM | POA: Diagnosis present

## 2020-05-11 DIAGNOSIS — Z9081 Acquired absence of spleen: Secondary | ICD-10-CM | POA: Diagnosis not present

## 2020-05-11 DIAGNOSIS — Z832 Family history of diseases of the blood and blood-forming organs and certain disorders involving the immune mechanism: Secondary | ICD-10-CM

## 2020-05-11 DIAGNOSIS — G8929 Other chronic pain: Secondary | ICD-10-CM | POA: Diagnosis not present

## 2020-05-11 DIAGNOSIS — D57419 Sickle-cell thalassemia with crisis, unspecified: Principal | ICD-10-CM | POA: Diagnosis present

## 2020-05-11 DIAGNOSIS — F17210 Nicotine dependence, cigarettes, uncomplicated: Secondary | ICD-10-CM | POA: Diagnosis present

## 2020-05-11 DIAGNOSIS — Z20822 Contact with and (suspected) exposure to covid-19: Secondary | ICD-10-CM | POA: Diagnosis present

## 2020-05-11 DIAGNOSIS — Z79891 Long term (current) use of opiate analgesic: Secondary | ICD-10-CM | POA: Diagnosis not present

## 2020-05-11 DIAGNOSIS — D5700 Hb-SS disease with crisis, unspecified: Secondary | ICD-10-CM | POA: Diagnosis present

## 2020-05-11 LAB — CBC WITH DIFFERENTIAL/PLATELET
Abs Immature Granulocytes: 0.04 10*3/uL (ref 0.00–0.07)
Basophils Absolute: 0 10*3/uL (ref 0.0–0.1)
Basophils Relative: 1 %
Eosinophils Absolute: 0.1 10*3/uL (ref 0.0–0.5)
Eosinophils Relative: 1 %
HCT: 27.3 % — ABNORMAL LOW (ref 36.0–46.0)
Hemoglobin: 9.4 g/dL — ABNORMAL LOW (ref 12.0–15.0)
Immature Granulocytes: 1 %
Lymphocytes Relative: 33 %
Lymphs Abs: 2.6 10*3/uL (ref 0.7–4.0)
MCH: 29.6 pg (ref 26.0–34.0)
MCHC: 34.4 g/dL (ref 30.0–36.0)
MCV: 85.8 fL (ref 80.0–100.0)
Monocytes Absolute: 0.7 10*3/uL (ref 0.1–1.0)
Monocytes Relative: 8 %
Neutro Abs: 4.4 10*3/uL (ref 1.7–7.7)
Neutrophils Relative %: 56 %
Platelets: 616 10*3/uL — ABNORMAL HIGH (ref 150–400)
RBC: 3.18 MIL/uL — ABNORMAL LOW (ref 3.87–5.11)
RDW: 15.6 % — ABNORMAL HIGH (ref 11.5–15.5)
WBC: 7.8 10*3/uL (ref 4.0–10.5)
nRBC: 0.9 % — ABNORMAL HIGH (ref 0.0–0.2)

## 2020-05-11 LAB — COMPREHENSIVE METABOLIC PANEL
ALT: 21 U/L (ref 0–44)
AST: 28 U/L (ref 15–41)
Albumin: 4.6 g/dL (ref 3.5–5.0)
Alkaline Phosphatase: 55 U/L (ref 38–126)
Anion gap: 7 (ref 5–15)
BUN: 9 mg/dL (ref 6–20)
CO2: 25 mmol/L (ref 22–32)
Calcium: 9.6 mg/dL (ref 8.9–10.3)
Chloride: 108 mmol/L (ref 98–111)
Creatinine, Ser: 0.67 mg/dL (ref 0.44–1.00)
GFR calc Af Amer: >60 mL/min (ref >60)
GFR calc non Af Amer: >60 mL/min (ref >60)
Glucose, Bld: 91 mg/dL (ref 70–99)
Potassium: 4.1 mmol/L (ref 3.5–5.1)
Sodium: 140 mmol/L (ref 135–145)
Total Bilirubin: 0.9 mg/dL (ref 0.3–1.2)
Total Protein: 8.5 g/dL — ABNORMAL HIGH (ref 6.5–8.1)

## 2020-05-11 LAB — I-STAT BETA HCG BLOOD, ED (MC, WL, AP ONLY): I-stat hCG, quantitative: <5 m[IU]/mL (ref <5)

## 2020-05-11 LAB — RETICULOCYTES
Immature Retic Fract: 37.2 % — ABNORMAL HIGH (ref 2.3–15.9)
RBC.: 3.18 MIL/uL — ABNORMAL LOW (ref 3.87–5.11)
Retic Count, Absolute: 207 10*3/uL — ABNORMAL HIGH (ref 19.0–186.0)
Retic Ct Pct: 6.5 % — ABNORMAL HIGH (ref 0.4–3.1)

## 2020-05-11 MED ORDER — HYDROMORPHONE HCL 2 MG/ML IJ SOLN
2.0000 mg | Freq: Once | INTRAMUSCULAR | Status: AC
  Administered 2020-05-11: 2 mg via INTRAVENOUS
  Filled 2020-05-11: qty 1

## 2020-05-11 MED ORDER — PRENATAL MULTIVITAMIN CH
1.0000 | ORAL_TABLET | Freq: Every day | ORAL | Status: DC
  Administered 2020-05-12 – 2020-05-13 (×2): 1 via ORAL
  Filled 2020-05-11 (×2): qty 1

## 2020-05-11 MED ORDER — HYDROMORPHONE 1 MG/ML IV SOLN
INTRAVENOUS | Status: DC
  Administered 2020-05-12: 30 mg via INTRAVENOUS
  Administered 2020-05-12: 3.5 mg via INTRAVENOUS
  Administered 2020-05-12: 4 mg via INTRAVENOUS
  Filled 2020-05-11: qty 30

## 2020-05-11 MED ORDER — HYDROMORPHONE HCL 2 MG/ML IJ SOLN
2.0000 mg | INTRAMUSCULAR | Status: AC
  Administered 2020-05-11: 2 mg via INTRAVENOUS
  Filled 2020-05-11: qty 1

## 2020-05-11 MED ORDER — POLYETHYLENE GLYCOL 3350 17 G PO PACK: 17.0000 g | PACK | Freq: Every day | ORAL | Status: DC | PRN

## 2020-05-11 MED ORDER — KETOROLAC TROMETHAMINE 30 MG/ML IJ SOLN
30.0000 mg | Freq: Four times a day (QID) | INTRAMUSCULAR | Status: DC
  Administered 2020-05-12 – 2020-05-13 (×7): 30 mg via INTRAVENOUS
  Filled 2020-05-11 (×7): qty 1

## 2020-05-11 MED ORDER — ONDANSETRON HCL 4 MG/2ML IJ SOLN
4.0000 mg | INTRAMUSCULAR | Status: DC | PRN
  Administered 2020-05-11 – 2020-05-13 (×3): 4 mg via INTRAVENOUS
  Filled 2020-05-11 (×4): qty 2

## 2020-05-11 MED ORDER — SODIUM CHLORIDE 0.9 % IV SOLN
25.0000 mg | INTRAVENOUS | Status: DC | PRN
  Filled 2020-05-11: qty 0.5

## 2020-05-11 MED ORDER — SENNOSIDES-DOCUSATE SODIUM 8.6-50 MG PO TABS
1.0000 | ORAL_TABLET | Freq: Two times a day (BID) | ORAL | Status: DC
  Administered 2020-05-12 (×2): 1 via ORAL
  Filled 2020-05-11 (×2): qty 1

## 2020-05-11 MED ORDER — SODIUM CHLORIDE 0.9% FLUSH: 9.0000 mL | INTRAVENOUS | Status: DC | PRN

## 2020-05-11 MED ORDER — SODIUM CHLORIDE 0.45 % IV SOLN: INTRAVENOUS | Status: DC

## 2020-05-11 MED ORDER — MORPHINE SULFATE ER 30 MG PO TBCR
60.0000 mg | EXTENDED_RELEASE_TABLET | Freq: Two times a day (BID) | ORAL | Status: DC
  Administered 2020-05-12 – 2020-05-13 (×4): 60 mg via ORAL
  Filled 2020-05-11 (×4): qty 2

## 2020-05-11 MED ORDER — DIPHENHYDRAMINE HCL 25 MG PO CAPS: 25.0000 mg | ORAL_CAPSULE | ORAL | Status: DC | PRN

## 2020-05-11 MED ORDER — SODIUM CHLORIDE 0.9% FLUSH: 3.0000 mL | Freq: Once | INTRAVENOUS | Status: DC

## 2020-05-11 MED ORDER — ENOXAPARIN SODIUM 40 MG/0.4ML ~~LOC~~ SOLN
40.0000 mg | SUBCUTANEOUS | Status: DC
  Administered 2020-05-11: 40 mg via SUBCUTANEOUS
  Filled 2020-05-11 (×2): qty 0.4

## 2020-05-11 MED ORDER — CITALOPRAM HYDROBROMIDE 20 MG PO TABS
10.0000 mg | ORAL_TABLET | Freq: Every day | ORAL | Status: DC
  Administered 2020-05-12 – 2020-05-13 (×2): 10 mg via ORAL
  Filled 2020-05-11 (×2): qty 1

## 2020-05-11 MED ORDER — DIPHENHYDRAMINE HCL 50 MG/ML IJ SOLN
25.0000 mg | Freq: Once | INTRAMUSCULAR | Status: AC
  Administered 2020-05-11: 25 mg via INTRAVENOUS
  Filled 2020-05-11: qty 1

## 2020-05-11 MED ORDER — CYCLOBENZAPRINE HCL 10 MG PO TABS
10.0000 mg | ORAL_TABLET | Freq: Two times a day (BID) | ORAL | Status: DC
  Administered 2020-05-12 – 2020-05-13 (×4): 10 mg via ORAL
  Filled 2020-05-11 (×4): qty 1

## 2020-05-11 MED ORDER — NALOXONE HCL 0.4 MG/ML IJ SOLN: 0.4000 mg | INTRAMUSCULAR | Status: DC | PRN

## 2020-05-11 NOTE — H&P (Signed)
History and Physical    Carmen [name]:[id number] DOB: 14-Mar-1991 DOA: 05/11/2020  PCP: Vevelyn Francois, NP  Patient coming from: Home  I have personally briefly reviewed patient's old medical records in [place]  Chief Complaint: Sickle cell pain crisis  HPI: Carmen [name] is a 29 y.o. female with medical history significant of HGB S beta thal dz.  Pt presents to ED with c/o sickle cell pain crisis.  Has been taking home Oxy, however insurance hasnt approved MS contin which was prescribed after discharge last time.  Thus she has not been on long acting opiates since discharge.  Pain has persisted since discharge, 10/10 diffuse pain.  No CP, no SOB, no fevers, chills, abd pain.   ED Course: Pain persists despite multiple rounds of IV dilaudid.   Review of Systems: As per HPI, otherwise all review of systems negative.  Past Medical History:  Diagnosis Date  . Headache(784.0)   . Sickle cell crisis ([place])   . Syphilis 2015   Was diagnosed and received one injection of antibiotics  . Thrombocytosis ([place]) 11/22/2014     CBC Latest Ref Rng & Units 12/13/2018 12/11/2018 12/10/2018 WBC 4.0 - 10.5 K/uL 14.8(H) 13.2(H) 15.9(H) Hemoglobin 12.0 - 15.0 g/dL 8.2(L) 7.7(L) 8.1(L) Hematocrit 36.0 - 46.0 % 23.7(L) 23.2(L) 24.5(L) Platelets 150 - 400 K/uL 326 399 388      Past Surgical History:  Procedure Laterality Date  . CESAREAN SECTION N/A 02/05/2019   Procedure: CESAREAN SECTION;  Surgeon: [name], MD;  Location: [facility] LD ORS;  Service: Obstetrics;  Laterality: N/A;  . CHOLECYSTECTOMY N/A 11/30/2014   Procedure: LAPAROSCOPIC CHOLECYSTECTOMY SINGLE SITE WITH INTRAOPERATIVE CHOLANGIOGRAM;  Surgeon: Michael Boston, MD;  Location: [facility] ORS;  Service: General;  Laterality: N/A;  . SPLENECTOMY       reports that she has been smoking cigarettes. She has never used smokeless tobacco. She reports that she does not drink alcohol and does not use drugs.  No Known Allergies  Family  History  Problem Relation Age of Onset  . Hypertension Mother   . Sickle cell anemia Sister   . Kidney disease Sister        Lupus  . Arthritis Sister   . Sickle cell anemia Sister   . Sickle cell trait Sister   . Heart disease Maternal Aunt        CABG  . Heart disease Maternal Uncle        CABG  . Lupus Sister      Prior to Admission medications   Medication Sig Start Date End Date Taking? Authorizing Provider  citalopram (CELEXA) 10 MG tablet Take 1 tablet (10 mg total) by mouth daily. 03/28/20 06/26/20 Yes Vevelyn Francois, NP  cyclobenzaprine (FLEXERIL) 10 MG tablet Take 1 tablet (10 mg total) by mouth 2 (two) times daily. 03/28/20 06/26/20 Yes Vevelyn Francois, NP  folic acid (FOLVITE) 1 MG tablet Take 1 tablet (1 mg total) by mouth daily. 03/15/19  Yes Lanae Boast, FNP  loratadine (CLARITIN) 10 MG tablet Take 1 tablet (10 mg total) by mouth daily. Patient taking differently: Take 10 mg by mouth daily as needed for allergies.  03/28/20  Yes Vevelyn Francois, NP  morphine (MS CONTIN) 60 MG 12 hr tablet Take 1 tablet (60 mg total) by mouth every 12 (twelve) hours. 05/01/20 05/31/20 Yes Azzie Glatter, FNP  Oxycodone HCl 10 MG TABS Take 1 tablet (10 mg total) by mouth every 6 (six)  hours as needed for up to 15 days (PAIN). 05/06/20 05/21/20 Yes Azzie Glatter, FNP  Prenatal Vit-Fe Fumarate-FA (PRENATAL VITAMIN PLUS LOW IRON) 27-1 MG TABS TAKE 1 TABLET BY MOUTH EVERY DAY Patient taking differently: Take 1 tablet by mouth daily.  03/03/20  Yes Shelly Bombard, MD  Vitamin D, Ergocalciferol, (DRISDOL) 1.25 MG (50000 UT) CAPS capsule TAKE ONE CAPSULE BY MOUTH ONE TIME PER WEEK Patient taking differently: Take 50,000 Units by mouth every 7 (seven) days.  10/11/19  Yes Azzie Glatter, FNP    Physical Exam: Vitals:   05/11/20 1333 05/11/20 1858 05/11/20 2125 05/11/20 2238  BP:  (!) 140/97 123/83 (!) 125/100  Pulse:  76 (!) 56 62  Resp:  18 17 15   Temp:      TempSrc:      SpO2:  100%  100% 100%  Weight: 84.2 kg     Height: 5\' 3"  (1.6 m)       Constitutional: NAD, calm, comfortable Eyes: PERRL, lids and conjunctivae normal ENMT: Mucous membranes are moist. Posterior pharynx clear of any exudate or lesions.Normal dentition.  Neck: normal, supple, no masses, no thyromegaly Respiratory: clear to auscultation bilaterally, no wheezing, no crackles. Normal respiratory effort. No accessory muscle use.  Cardiovascular: Regular rate and rhythm, no murmurs / rubs / gallops. No extremity edema. 2+ pedal pulses. No carotid bruits.  Abdomen: no tenderness, no masses palpated. No hepatosplenomegaly. Bowel sounds positive.  Musculoskeletal: no clubbing / cyanosis. No joint deformity upper and lower extremities. Good ROM, no contractures. Normal muscle tone.  Skin: no rashes, lesions, ulcers. No induration Neurologic: CN 2-12 grossly intact. Sensation intact, DTR normal. Strength 5/5 in all 4.  Psychiatric: Normal judgment and insight. Alert and oriented x 3. Normal mood.    Labs on Admission: I have personally reviewed following labs and imaging studies  CBC: Recent Labs  Lab 05/05/20 1350 05/11/20 1953  WBC 10.8* 7.8  NEUTROABS  --  4.4  HGB 7.9* 9.4*  HCT 22.6* 27.3*  MCV 84.6 85.8  PLT 534* 902*   Basic Metabolic Panel: Recent Labs  Lab 05/05/20 1350 05/11/20 1953  NA 139 140  K 3.7 4.1  CL 103 108  CO2 26 25  GLUCOSE 89 91  BUN 7 9  CREATININE 0.40* 0.67  CALCIUM 9.3 9.6   GFR: Estimated Creatinine Clearance: 108.6 mL/min (by C-G formula based on SCr of 0.67 mg/dL). Liver Function Tests: Recent Labs  Lab 05/11/20 1953  AST 28  ALT 21  ALKPHOS 55  BILITOT 0.9  PROT 8.5*  ALBUMIN 4.6   No results for input(s): LIPASE, AMYLASE in the last 168 hours. No results for input(s): AMMONIA in the last 168 hours. Coagulation Profile: No results for input(s): INR, PROTIME in the last 168 hours. Cardiac Enzymes: No results for input(s): CKTOTAL, CKMB,  CKMBINDEX, TROPONINI in the last 168 hours. BNP (last 3 results) No results for input(s): PROBNP in the last 8760 hours. HbA1C: No results for input(s): HGBA1C in the last 72 hours. CBG: No results for input(s): GLUCAP in the last 168 hours. Lipid Profile: No results for input(s): CHOL, HDL, LDLCALC, TRIG, CHOLHDL, LDLDIRECT in the last 72 hours. Thyroid Function Tests: No results for input(s): TSH, T4TOTAL, FREET4, T3FREE, THYROIDAB in the last 72 hours. Anemia Panel: Recent Labs    05/11/20 1953  RETICCTPCT 6.5*   Urine analysis:    Component Value Date/Time   COLORURINE YELLOW 12/31/2019 [place] 12/31/2019 1213  LABSPEC 1.011 12/31/2019 1213   PHURINE 6.0 12/31/2019 1213   GLUCOSEU NEGATIVE 12/31/2019 1213   HGBUR NEGATIVE 12/31/2019 [place] 12/31/2019 1213   BILIRUBINUR Negative 08/10/2019 1534   KETONESUR NEGATIVE 12/31/2019 1213   PROTEINUR NEGATIVE 12/31/2019 1213   UROBILINOGEN 1.0 08/10/2019 1534   UROBILINOGEN 1.0 01/12/2018 0943   NITRITE NEGATIVE 12/31/2019 1213   LEUKOCYTESUR SMALL (A) 12/31/2019 1213    Radiological Exams on Admission: No results found.  EKG: Independently reviewed.  Assessment/Plan Principal Problem:   Sickle cell pain crisis ([place])    1. Sickle cell pain crisis - 1. Sickle cell pathway 2. Scheduled toradol 3. Start scheduled MS contin (which she wasn't able to fill as outpt due to insurance issues). 4. Dilaudid PCA per prior settings 5. Daily CBC 6. HALF NS at 75  DVT prophylaxis: Lovenox Code Status: Full Family Communication: No family in room Disposition Plan: Home after pain controlled Consults called: None Admission status: Admit to inpatient  Severity of Illness: The appropriate patient status for this patient is INPATIENT. Inpatient status is judged to be reasonable and necessary in order to provide the required intensity of service to ensure the patient's safety. The patient's  presenting symptoms, physical exam findings, and initial radiographic and laboratory data in the context of their chronic comorbidities is felt to place them at high risk for further clinical deterioration. Furthermore, it is not anticipated that the patient will be medically stable for discharge from the hospital within 2 midnights of admission. The following factors support the patient status of inpatient.   IP status for dilaudid PCA for sickle cell crisis.   * I certify that at the point of admission it is my clinical judgment that the patient will require inpatient hospital care spanning beyond 2 midnights from the point of admission due to high intensity of service, high risk for further deterioration and high frequency of surveillance required.*    Taye Cato M. DO [facility]  How to contact the [facility] Attending or Consulting provider [place] or covering provider during after hours [place], for this patient?  1. Check the care team in [facility] and look for a) attending/consulting [facility] provider listed and b) the [facility] team listed 2. Log into [email or web address]  Amion Physician Scheduling and messaging for groups and whole hospitals  On call and physician scheduling software for group practices, residents, hospitalists and other medical providers for call, clinic, rotation and shift schedules. OnCall [employer] is a hospital-wide system for scheduling doctors and paging doctors on call. EasyPlot is for scientific plotting and data analysis.  [email or web address]  and use [place]'s universal password to access. If you do not have the password, please contact the hospital operator.  3. Locate the [facility] provider you are looking for under [facility] and page to a number that you can be directly reached. 4. If you still have difficulty reaching the provider, please page the [facility] (Director on Call) for the Hospitalists listed on amion for assistance.  05/11/2020, 10:54 PM

## 2020-05-11 NOTE — ED Triage Notes (Signed)
Patient reports scc with pain all over rated 10/10. Patient states she was hospitalized last week and was supposed to get medications from pcp but unable to do so due to insurance prior auth needed. Patient states pain has continued since her last d/c

## 2020-05-11 NOTE — ED Provider Notes (Signed)
[facility] DEPT Provider Note   CSN: [id number] Arrival date & time: 05/11/20  1313    History Chief Complaint  Patient presents with  . Sickle Cell Pain Crisis    Carmen [name] is a 29 y.o. female with past with history significant for sickle cell who presents for evaluation of sickle cell crisis.  Patient states she has been taking her home oxycodone however has not had her morphine due to her insurance not approving this and is pending approval.  Patient states he was recently discharged from the hospital.  States she can "not take the pain anymore."  She states she continues to have 10-10 pain diffusely.  No chest pain, shortness of breath, fever, chills abdominal pain, diarrhea, dysuria.  No unilateral leg swelling, redness or warmth.  Denies additional aggravating or relieving factors  States pain similar to prior sickle cell crisis.  History obtained from patient and past medical records.  No interpreter used.  HPI     Past Medical History:  Diagnosis Date  . Headache(784.0)   . Sickle cell crisis ([place])   . Syphilis 2015   Was diagnosed and received one injection of antibiotics  . Thrombocytosis ([place]) 11/22/2014     CBC Latest Ref Rng & Units 12/13/2018 12/11/2018 12/10/2018 WBC 4.0 - 10.5 K/uL 14.8(H) 13.2(H) 15.9(H) Hemoglobin 12.0 - 15.0 g/dL 8.2(L) 7.7(L) 8.1(L) Hematocrit 36.0 - 46.0 % 23.7(L) 23.2(L) 24.5(L) Platelets 150 - 400 K/uL 326 399 388      Patient Active Problem List   Diagnosis Date Noted  . Sickle cell crisis ([place]) 03/31/2020  . Bacterial vaginitis 01/04/2020  . Acute cystitis without hematuria   . Dysuria 12/31/2019  . Urine leukocytes   . Sickle cell pain crisis ([place]) 09/10/2019  . Sickle cell disease ([place]) 12/04/2018  . Alpha thalassemia silent carrier 12/04/2018  . Chronic prescription opiate use 03/13/2018  . Chronic musculoskeletal pain 03/13/2018  . Vitamin D deficiency 01/13/2018  . Leukocytosis 08/21/2017  .  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 Hb-SS disease without crisis ([place]) 08/15/2016  . Anemia of chronic disease   . Chest pain 11/10/2015  . Systolic ejection murmur 10/93/2355  . Sickle cell anemia of mother during pregnancy [facility]) 05/16/2014    Past Surgical History:  Procedure Laterality Date  . CESAREAN SECTION N/A 02/05/2019   Procedure: CESAREAN SECTION;  Surgeon: [name], MD;  Location: [facility] LD [facility]S;  Service: Obstetrics;  Laterality: N/A;  . CHOLECYSTECTOMY N/A 11/30/2014   Procedure: LAPAROSCOPIC CHOLECYSTECTOMY SINGLE SITE WITH INTRAOPERATIVE CHOLANGIOGRAM;  Surgeon: Michael Boston, MD;  Location: [facility] ORS;  Service: General;  Laterality: N/A;  . SPLENECTOMY       OB History    Gravida  2   Para  1   Term  1   Preterm      AB  1   Living  1     SAB      TAB  1   Ectopic      Multiple  0   Live Births  1           Family History  Problem Relation Age of Onset  . Hypertension Mother   . Sickle cell anemia Sister   . Kidney disease Sister        Lupus  . Arthritis Sister   . Sickle cell anemia Sister   . Sickle cell trait Sister   . Heart disease Maternal Aunt  CABG  . Heart disease Maternal Uncle        CABG  . Lupus Sister     Social History   Tobacco Use  . Smoking status: Current Some Day Smoker    Types: Cigarettes  . Smokeless tobacco: Never Used  Vaping Use  . Vaping Use: Never used  Substance Use Topics  . Alcohol use: No  . Drug use: No    Home Medications Prior to Admission medications   Medication Sig Start Date End Date Taking? Authorizing Provider  citalopram (CELEXA) 10 MG tablet Take 1 tablet (10 mg total) by mouth daily. 03/28/20 06/26/20 Yes Vevelyn Francois, NP  cyclobenzaprine (FLEXERIL) 10 MG tablet Take 1 tablet (10 mg total) by mouth 2 (two) times daily. 03/28/20 06/26/20 Yes Vevelyn Francois, NP  folic acid (FOLVITE) 1 MG tablet Take 1 tablet (1 mg total) by mouth daily. 03/15/19  Yes  Lanae Boast, FNP  loratadine (CLARITIN) 10 MG tablet Take 1 tablet (10 mg total) by mouth daily. Patient taking differently: Take 10 mg by mouth daily as needed for allergies.  03/28/20  Yes Vevelyn Francois, NP  morphine (MS CONTIN) 60 MG 12 hr tablet Take 1 tablet (60 mg total) by mouth every 12 (twelve) hours. 05/01/20 05/31/20 Yes Azzie Glatter, FNP  Oxycodone HCl 10 MG TABS Take 1 tablet (10 mg total) by mouth every 6 (six) hours as needed for up to 15 days (PAIN). 05/06/20 05/21/20 Yes Azzie Glatter, FNP  Prenatal Vit-Fe Fumarate-FA (PRENATAL VITAMIN PLUS LOW IRON) 27-1 MG TABS TAKE 1 TABLET BY MOUTH EVERY DAY Patient taking differently: Take 1 tablet by mouth daily.  03/03/20  Yes Shelly Bombard, MD  Vitamin D, Ergocalciferol, (DRISDOL) 1.25 MG (50000 UT) CAPS capsule TAKE ONE CAPSULE BY MOUTH ONE TIME PER WEEK Patient taking differently: Take 50,000 Units by mouth every 7 (seven) days.  10/11/19  Yes Azzie Glatter, FNP    Allergies    Patient has no known allergies.  Review of Systems   Review of Systems  Constitutional: Negative.   HENT: Negative.   Respiratory: Negative.   Cardiovascular: Negative.   Gastrointestinal: Negative.   Genitourinary: Negative.   Musculoskeletal: Positive for myalgias. Negative for arthralgias, back pain, gait problem, joint swelling, neck pain and neck stiffness.  Skin: Negative.   Neurological: Negative.   All other systems reviewed and are negative.   Physical Exam Updated Vital Signs BP 123/83 (BP Location: Left Arm)   Pulse (!) 56   Temp 98.9 F (37.2 C) (Oral)   Resp 17   Ht 5\' 3"  (1.6 m)   Wt 84.2 kg   LMP 04/15/2020   SpO2 100%   BMI 32.88 kg/m   Physical Exam Vitals and nursing note reviewed.  Constitutional:      General: She is not in acute distress.    Appearance: She is well-developed. She is not ill-appearing, toxic-appearing or diaphoretic.  HENT:     Head: Normocephalic and atraumatic.     Nose: Nose normal.      Mouth/Throat:     Mouth: Mucous membranes are moist.  Eyes:     Pupils: Pupils are equal, round, and reactive to light.  Cardiovascular:     Rate and Rhythm: Normal rate.     Pulses: Normal pulses.     Heart sounds: Normal heart sounds.  Pulmonary:     Effort: Pulmonary effort is normal. No respiratory distress.     Breath sounds:  Normal breath sounds.  Abdominal:     General: Bowel sounds are normal. There is no distension.     Tenderness: There is no abdominal tenderness. There is no right CVA tenderness, left CVA tenderness, guarding or rebound.  Musculoskeletal:        General: No swelling, tenderness, deformity or signs of injury. Normal range of motion.     Cervical back: Normal range of motion.     Right lower leg: No edema.     Left lower leg: No edema.  Skin:    General: Skin is warm and dry.     Capillary Refill: Capillary refill takes less than 2 seconds.  Neurological:     General: No focal deficit present.     Mental Status: She is alert and oriented to person, place, and time.     Comments: Ambulatory without difficulty    ED Results / Procedures / Treatments   Labs (all labs ordered are listed, but only abnormal results are displayed) Labs Reviewed  COMPREHENSIVE METABOLIC PANEL - Abnormal; Notable for the following components:      Result Value   Total Protein 8.5 (*)    All other components within normal limits  CBC WITH DIFFERENTIAL/PLATELET - Abnormal; Notable for the following components:   RBC 3.18 (*)    Hemoglobin 9.4 (*)    HCT 27.3 (*)    RDW 15.6 (*)    Platelets 616 (*)    nRBC 0.9 (*)    All other components within normal limits  RETICULOCYTES - Abnormal; Notable for the following components:   Retic Ct Pct 6.5 (*)    RBC. 3.18 (*)    Retic Count, Absolute 207.0 (*)    Immature Retic Fract 37.2 (*)    All other components within normal limits  SARS CORONAVIRUS 2 BY RT PCR (HOSPITAL ORDER, [facility])  CBC    I-STAT BETA HCG BLOOD, ED ([facility], [facility], [facility] ONLY)    EKG None  Radiology No results found.  Procedures Procedures (including critical care time)  Medications Ordered in ED Medications  sodium chloride flush (NS) 0.9 % injection 3 mL (has no administration in time range)  0.45 % sodium chloride infusion ( Intravenous New Bag/Given 05/11/20 1954)  ondansetron (ZOFRAN) injection 4 mg (4 mg Intravenous Given 05/11/20 1955)  HYDROmorphone (DILAUDID) injection 2 mg (has no administration in time range)  morphine (MS CONTIN) 12 hr tablet 60 mg (has no administration in time range)  prenatal multivitamin tablet 1 tablet (has no administration in time range)  cyclobenzaprine (FLEXERIL) tablet 10 mg (has no administration in time range)  citalopram (CELEXA) tablet 10 mg (has no administration in time range)  senna-docusate (Senokot-S) tablet 1 tablet (has no administration in time range)  polyethylene glycol (MIRALAX / GLYCOLAX) packet 17 g (has no administration in time range)  enoxaparin (LOVENOX) injection 40 mg (has no administration in time range)  ketorolac (TORADOL) 30 MG/ML injection 30 mg (has no administration in time range)  naloxone (NARCAN) injection 0.4 mg (has no administration in time range)    And  sodium chloride flush (NS) 0.9 % injection 9 mL (has no administration in time range)  diphenhydrAMINE (BENADRYL) capsule 25 mg (has no administration in time range)    Or  diphenhydrAMINE (BENADRYL) 25 mg in sodium chloride 0.9 % 50 mL IVPB (has no administration in time range)  HYDROmorphone (DILAUDID) 1 mg/mL PCA injection (has no administration in time range)  HYDROmorphone (DILAUDID)  injection 2 mg (2 mg Intravenous Given 05/11/20 2037)  HYDROmorphone (DILAUDID) injection 2 mg (2 mg Intravenous Given 05/11/20 2132)  diphenhydrAMINE (BENADRYL) injection 25 mg (25 mg Intravenous Given 05/11/20 1957)   ED Course  I have reviewed the triage vital signs and the nursing  notes.  Pertinent labs & imaging results that were available during my care of the patient were reviewed by me and considered in my medical decision making (see chart for details).  29 year old presents for evaluation of sickle cell pain.  Similar to prior crises.  Recently discharged.  Has been taking her home oxycodone however has been out of morphine due to her insurance awaiting prior approval.  Pain located diffusely to extremities.  No unilateral leg swelling, redness or warmth.  No edema, erythema or warmth.  Low suspicion for VTE.  No chest pain or shortness of breath.  She is without tachycardia, tachypnea or hypoxia.  Plan on labs, pain management and reassess: CBC without leukocytosis, hemoglobin 9.4, elevated from baseline Metabolic panel with electrolyte, renal normality Covid pending Pregnancy test negative Elevated rechecks  Patient reassessed. Pain controlled still rated a 10/10.  Continues to deny chest pain, shortness of breath.  She has received 2 doses of IV pain meds.  Will admit for intractable pain.  Patient with intractable pain likely due to sickle cell crises.  Low suspicion for VTE, infectious process, septic joint, acute chest syndrome.  The patient has been appropriately medically screened and/or stabilized in the ED. I have low suspicion for any other emergent medical condition which would require further screening, evaluation or treatment in the ED or require inpatient management.  Patient is hemodynamically stable and in no acute distress.  Patient able to ambulate in department prior to ED.  Evaluation does not show acute pathology that would require ongoing or additional emergent interventions while in the emergency department or further inpatient treatment.  I have discussed the diagnosis with the patient and answered all questions.  Pain is been managed while in the emergency department and patient has no further complaints prior to discharge.  Patient is  comfortable with plan discussed in room and is stable for discharge at this time.  I have discussed strict return precautions for returning to the emergency department.  Patient was encouraged to follow-up with PCP/specialist refer to at discharge.    MDM Rules/Calculators/A&P                           Final Clinical Impression(s) / ED Diagnoses Final diagnoses:  Sickle cell pain crisis [facility])    Rx / DC Orders ED Discharge Orders    None       Gaven Eugene A, PA-C 05/11/20 2232    Lacretia Leigh, MD 05/13/20 1639

## 2020-05-12 ENCOUNTER — Encounter: Payer: Self-pay | Admitting: Clinical

## 2020-05-12 ENCOUNTER — Encounter (HOSPITAL_COMMUNITY): Payer: Self-pay | Admitting: Internal Medicine

## 2020-05-12 DIAGNOSIS — D5700 Hb-SS disease with crisis, unspecified: Secondary | ICD-10-CM

## 2020-05-12 LAB — CBC
HCT: 23.6 % — ABNORMAL LOW (ref 36.0–46.0)
Hemoglobin: 8.3 g/dL — ABNORMAL LOW (ref 12.0–15.0)
MCH: 29.9 pg (ref 26.0–34.0)
MCHC: 35.2 g/dL (ref 30.0–36.0)
MCV: 84.9 fL (ref 80.0–100.0)
Platelets: 512 10*3/uL — ABNORMAL HIGH (ref 150–400)
RBC: 2.78 MIL/uL — ABNORMAL LOW (ref 3.87–5.11)
RDW: 15.3 % (ref 11.5–15.5)
WBC: 9.8 10*3/uL (ref 4.0–10.5)
nRBC: 1.7 % — ABNORMAL HIGH (ref 0.0–0.2)

## 2020-05-12 LAB — SARS CORONAVIRUS 2 BY RT PCR (HOSPITAL ORDER, PERFORMED IN ~~LOC~~ HOSPITAL LAB): SARS Coronavirus 2: NEGATIVE

## 2020-05-12 MED ORDER — HYDROMORPHONE 1 MG/ML IV SOLN
INTRAVENOUS | Status: DC
  Administered 2020-05-12: 6 mg via INTRAVENOUS
  Administered 2020-05-12: 1.8 mg via INTRAVENOUS
  Administered 2020-05-12: 30 mg via INTRAVENOUS
  Administered 2020-05-12: 13.5 mg via INTRAVENOUS
  Administered 2020-05-13: 30 mg via INTRAVENOUS
  Administered 2020-05-13: 13.8 mg via INTRAVENOUS
  Administered 2020-05-13: 10.2 mg via INTRAVENOUS
  Filled 2020-05-12 (×2): qty 30

## 2020-05-12 MED ORDER — SODIUM CHLORIDE 0.9% FLUSH: 10.0000 mL | INTRAVENOUS | Status: DC | PRN

## 2020-05-12 MED ORDER — OXYCODONE HCL 5 MG PO TABS
10.0000 mg | ORAL_TABLET | Freq: Four times a day (QID) | ORAL | Status: DC | PRN
  Administered 2020-05-12: 10 mg via ORAL
  Filled 2020-05-12 (×2): qty 2

## 2020-05-12 MED ORDER — SODIUM CHLORIDE 0.9% FLUSH: 10.0000 mL | Freq: Two times a day (BID) | INTRAVENOUS | Status: DC

## 2020-05-12 NOTE — Progress Notes (Signed)
Subjective: Carmen [name] is a 29 year old female with a medical history significant for sickle cell disease, chronic pain syndrome, opiate dependence and tolerance, and history of anemia of chronic disease was admitted for sickle cell pain crisis.  Patient states that pain has been uncontrolled over the past week being that she has been out of [facility] Contin.  Pain is primarily to low back and lower extremities.  Her pain persists despite IV Dilaudid PCA.  Pain intensity is 9/10 characterized as constant and throbbing.  Patient is very tearful. She denies headache, chest pain, shortness of breath, urinary symptoms, nausea, vomiting, or diarrhea.  Objective:  Vital signs in last 24 hours:  Vitals:   05/12/20 0758 05/12/20 0816 05/12/20 1228 05/12/20 1300  BP: 112/82  120/84   Pulse: 62  82   Resp: 16 11 15 15   Temp: 98 F (36.7 C)  98.3 F (36.8 C)   TempSrc: Oral  Oral   SpO2: 96% 97% 98% 98%  Weight:      Height:        Intake/Output from previous day:   Intake/Output Summary (Last 24 hours) at 05/12/2020 1351 Last data filed at 05/12/2020 0500 Gross per 24 hour  Intake 922.35 ml  Output --  Net 922.35 ml    Physical Exam: General: Alert, awake, oriented x3, in no acute distress.  HEENT: [place]/AT PEERL, EOMI Neck: Trachea midline,  no masses, no thyromegal,y no JVD, no carotid bruit OROPHARYNX:  Moist, No exudate/ erythema/lesions.  Heart: Regular rate and rhythm, without murmurs, rubs, gallops, PMI non-displaced, no heaves or thrills on palpation.  Lungs: Clear to auscultation, no wheezing or rhonchi noted. No increased vocal fremitus resonant to percussion  Abdomen: Soft, nontender, nondistended, positive bowel sounds, no masses no hepatosplenomegaly noted..  Neuro: No focal neurological deficits noted cranial nerves II through XII grossly intact. DTRs 2+ bilaterally upper and lower extremities. Strength 5 out of 5 in bilateral upper and lower extremities. Musculoskeletal: No  warm swelling or erythema around joints, no spinal tenderness noted. Psychiatric: Patient alert and oriented x3, good insight and cognition, good recent to remote recall. Lymph node survey: No cervical axillary or inguinal lymphadenopathy noted.  Lab Results:  Basic Metabolic Panel:    Component Value Date/Time   NA 140 05/11/2020 1953   NA 135 03/28/2020 1542   K 4.1 05/11/2020 1953   CL 108 05/11/2020 1953   CO2 25 05/11/2020 1953   BUN 9 05/11/2020 1953   BUN 9 03/28/2020 1542   CREATININE 0.67 05/11/2020 1953   GLUCOSE 91 05/11/2020 1953   CALCIUM 9.6 05/11/2020 1953   CBC:    Component Value Date/Time   WBC 9.8 05/12/2020 0636   HGB 8.3 (L) 05/12/2020 0636   HGB 9.8 (L) 03/28/2020 1542   HCT 23.6 (L) 05/12/2020 0636   HCT 29.8 (L) 03/28/2020 1542   PLT 512 (H) 05/12/2020 0636   PLT 516 (H) 03/28/2020 1542   MCV 84.9 05/12/2020 0636   MCV 89 03/28/2020 1542   NEUTROABS 4.4 05/11/2020 1953   NEUTROABS 5.6 03/28/2020 1542   LYMPHSABS 2.6 05/11/2020 1953   LYMPHSABS 4.6 (H) 03/28/2020 1542   MONOABS 0.7 05/11/2020 1953   EOSABS 0.1 05/11/2020 1953   EOSABS 0.2 03/28/2020 1542   BASOSABS 0.0 05/11/2020 1953   BASOSABS 0.1 03/28/2020 1542    Recent Results (from the past 240 hour(s))  SARS Coronavirus 2 by RT PCR (hospital order, performed in [facility] hospital lab) Nasopharyngeal Nasopharyngeal Swab  Status: None   Collection Time: 05/04/20 12:50 PM   Specimen: Nasopharyngeal Swab  Result Value Ref Range Status   SARS Coronavirus 2 NEGATIVE NEGATIVE Final    Comment: (NOTE) SARS-CoV-2 target nucleic acids are NOT DETECTED.  The SARS-CoV-2 RNA is generally detectable in upper and lower respiratory specimens during the acute phase of infection. The lowest concentration of SARS-CoV-2 viral copies this assay can detect is 250 copies / mL. A negative result does not preclude SARS-CoV-2 infection and should not be used as the sole basis for treatment or  other patient management decisions.  A negative result may occur with improper specimen collection / handling, submission of specimen other than nasopharyngeal swab, presence of viral mutation(s) within the areas targeted by this assay, and inadequate number of viral copies (<250 copies / mL). A negative result must be combined with clinical observations, patient history, and epidemiological information.  Fact Sheet for Patients:   StrictlyIdeas.no  Fact Sheet for Healthcare Providers: BankingDealers.co.za  This test is not yet approved or  cleared by the [place] FDA and has been authorized for detection and/or diagnosis of SARS-CoV-2 by FDA under an Emergency Use Authorization (EUA).  This EUA will remain in effect (meaning this test can be used) for the duration of the COVID-19 declaration under Section 564(b)(1) of the Act, 21 U.S.C. section 360bbb-3(b)(1), unless the authorization is terminated or revoked sooner.  Performed at [facility], [place] [address], [place], [place] [address]   SARS Coronavirus 2 by RT PCR (hospital order, performed in [facility] hospital lab) Nasopharyngeal Nasopharyngeal Swab     Status: None   Collection Time: 05/11/20 10:46 PM   Specimen: Nasopharyngeal Swab  Result Value Ref Range Status   SARS Coronavirus 2 NEGATIVE NEGATIVE Final    Comment: (NOTE) SARS-CoV-2 target nucleic acids are NOT DETECTED.  The SARS-CoV-2 RNA is generally detectable in upper and lower respiratory specimens during the acute phase of infection. The lowest concentration of SARS-CoV-2 viral copies this assay can detect is 250 copies / mL. A negative result does not preclude SARS-CoV-2 infection and should not be used as the sole basis for treatment or other patient management decisions.  A negative result may occur with improper specimen collection / handling, submission of specimen other than  nasopharyngeal swab, presence of viral mutation(s) within the areas targeted by this assay, and inadequate number of viral copies (<250 copies / mL). A negative result must be combined with clinical observations, patient history, and epidemiological information.  Fact Sheet for Patients:   StrictlyIdeas.no  Fact Sheet for Healthcare Providers: BankingDealers.co.za  This test is not yet approved or  cleared by the [place] FDA and has been authorized for detection and/or diagnosis of SARS-CoV-2 by FDA under an Emergency Use Authorization (EUA).  This EUA will remain in effect (meaning this test can be used) for the duration of the COVID-19 declaration under Section 564(b)(1) of the Act, 21 U.S.C. section 360bbb-3(b)(1), unless the authorization is terminated or revoked sooner.  Performed at [facility], [place] [address], [place], [place] [address]     Studies/Results: No results found.  Medications: Scheduled Meds: . citalopram  10 mg Oral Daily  . cyclobenzaprine  10 mg Oral BID  . enoxaparin (LOVENOX) injection  40 mg Subcutaneous Q24H  . HYDROmorphone   Intravenous Q4H  . ketorolac  30 mg Intravenous Q6H  . morphine  60 mg Oral Q12H  . prenatal multivitamin  1 tablet Oral Daily  .  senna-docusate  1 tablet Oral BID  . sodium chloride flush  3 mL Intravenous Once   Continuous Infusions: . sodium chloride 75 mL/hr at 05/12/20 0500  . diphenhydrAMINE     PRN Meds:.diphenhydrAMINE **OR** diphenhydrAMINE, naloxone **AND** sodium chloride flush, ondansetron, oxyCODONE, polyethylene glycol  Consultants:  None  Procedures:  None  Antibiotics:  None  Assessment/Plan: Principal Problem:   Sickle cell pain crisis ([place]) Active Problems:   Chronic musculoskeletal pain  Sickle cell disease with pain crisis: Continue IV fluids at [facility] Patient's pain is uncontrolled, increase settings on PCA to 0.6  mg, 10-minute lockout, and 4 mg/h. Oxycodone 10 mg every 6 hours for severe breakthrough pain Monitor vital signs closely, reevaluate pain scale regularly, and supplemental oxygen as needed  Chronic pain syndrome:  Continue home medications  Sickle cell anemia:  Hemoglobin is 8.3, which is consistent with patient's baseline. There is no clinical indication for blood transfusion at this time. Continue to follow closely   Code Status: Full Code Family Communication: N/A Disposition Plan: Not yet ready for discharge  [place], MSN, FNP-C Patient [place], [place] [address] [phone]  If 5PM-7AM, please contact night-coverage.  05/12/2020, 1:51 PM  LOS: 1 day

## 2020-05-13 DIAGNOSIS — M7918 Myalgia, other site: Secondary | ICD-10-CM

## 2020-05-13 DIAGNOSIS — G8929 Other chronic pain: Secondary | ICD-10-CM

## 2020-05-13 LAB — CBC
HCT: 24.1 % — ABNORMAL LOW (ref 36.0–46.0)
Hemoglobin: 8.4 g/dL — ABNORMAL LOW (ref 12.0–15.0)
MCH: 30.1 pg (ref 26.0–34.0)
MCHC: 34.9 g/dL (ref 30.0–36.0)
MCV: 86.4 fL (ref 80.0–100.0)
Platelets: 522 10*3/uL — ABNORMAL HIGH (ref 150–400)
RBC: 2.79 MIL/uL — ABNORMAL LOW (ref 3.87–5.11)
RDW: 15.5 % (ref 11.5–15.5)
WBC: 10.4 10*3/uL (ref 4.0–10.5)
nRBC: 0.5 % — ABNORMAL HIGH (ref 0.0–0.2)

## 2020-05-13 NOTE — Discharge Summary (Signed)
Physician Discharge Summary  Carmen [name] EQA:[id number] DOB: November 30, 1990 DOA: 05/11/2020  PCP: Vevelyn Francois, NP  Admit date: 05/11/2020  Discharge date: 05/13/2020  Discharge Diagnoses:  Principal Problem:   Sickle cell pain crisis [facility]) Active Problems:   Chronic musculoskeletal pain   Discharge Condition: Stable  Disposition:   Follow-up Information    Vevelyn Francois, NP Follow up in 2 week(s).   Specialty: Adult Health Nurse Practitioner Contact information: [address] [name] [address] [phone]              Pt is discharged home in good condition and is to follow up with Vevelyn Francois, NP this week to have labs evaluated. Carmen [name] is instructed to increase activity slowly and balance with rest for the next few days, and use prescribed medication to complete treatment of pain  Diet: Regular Wt Readings from Last 3 Encounters:  05/11/20 80.5 kg  05/06/20 84.2 kg  05/02/20 86.3 kg    History of present illness:  Carmen [name] is a 29 year old female with a medical history significant for sickle cell beta thalassemia, chronic pain syndrome, opiate dependence and tolerance, and history of anemia of chronic disease presented to the ER with complaints of sickle cell pain crisis.  Patient has been taking home oxycodone, however insurance has not approved MS Contin, which was prescribed following previous discharge.  Thus, patient has not been on long-acting opiates over the last several days.  Pain has persisted since discharge, 10/10 diffuse pain.  No chest pain, no shortness of breath, no fevers, chills, or abdominal pain.  ER course: Pain persists despite multiple rounds of IV Dilaudid  Hospital Course:  Sickle cell pain crisis:  Patient was admitted for sickle cell pain crisis and managed appropriately with IVF, IV Dilaudid via PCA and IV Toradol, as well as other adjunct therapies per sickle cell pain management protocols.  Patient transition  to home medications.  Advised to follow-up with PCP in 2 weeks to repeat CBC and CMP.  Prior authorization was approved for MS Contin.  Patient advised to resume medication.  Reviewed all laboratory values, consistent with patient's baseline.  He is alert, oriented, and ambulating without assistance.  Patient is afebrile and oxygen saturation is 98% on RA.  Patient was discharged home today in a hemodynamically stable condition.   Discharge Exam: Vitals:   05/13/20 0931 05/13/20 1128  BP: 122/84   Pulse: 72   Resp: 16 16  Temp: 98.1 F (36.7 C)   SpO2: 96% 98%   Vitals:   05/13/20 0550 05/13/20 0855 05/13/20 0931 05/13/20 1128  BP:   122/84   Pulse:   72   Resp: 14 14 16 16   Temp:   98.1 F (36.7 C)   TempSrc:   Oral   SpO2: 94% 95% 96% 98%  Weight:      Height:        General appearance : Awake, alert, not in any distress. Speech Clear. Not toxic looking HEENT: Atraumatic and Normocephalic, pupils equally reactive to light and accomodation Neck: Supple, no JVD. No cervical lymphadenopathy.  Chest: Good air entry bilaterally, no added sounds  CVS: S1 S2 regular, no murmurs.  Abdomen: Bowel sounds present, Non tender and not distended with no gaurding, rigidity or rebound. Extremities: B/L Lower Ext shows no edema, both legs are warm to touch Neurology: Awake alert, and oriented X 3, CN II-XII intact, Non focal Skin: No Rash  Discharge Instructions  Discharge Instructions    Discharge patient   Complete by: As directed    Discharge disposition: 01-Home or Self Care   Discharge patient date: 05/13/2020     Allergies as of 05/13/2020   No Known Allergies     Medication List    TAKE these medications   citalopram 10 MG tablet Commonly known as: CeleXA Take 1 tablet (10 mg total) by mouth daily.   cyclobenzaprine 10 MG tablet Commonly known as: FLEXERIL Take 1 tablet (10 mg total) by mouth 2 (two) times daily.   folic acid 1 MG tablet Commonly known as:  FOLVITE Take 1 tablet (1 mg total) by mouth daily.   loratadine 10 MG tablet Commonly known as: CLARITIN Take 1 tablet (10 mg total) by mouth daily. What changed:   when to take this  reasons to take this   morphine 60 MG 12 hr tablet Commonly known as: MS Contin Take 1 tablet (60 mg total) by mouth every 12 (twelve) hours.   Oxycodone HCl 10 MG Tabs Take 1 tablet (10 mg total) by mouth every 6 (six) hours as needed for up to 15 days (PAIN).   Prenatal Vitamin Plus Low Iron 27-1 MG Tabs TAKE 1 TABLET BY MOUTH EVERY DAY   Vitamin D (Ergocalciferol) 1.25 MG (50000 UNIT) Caps capsule Commonly known as: DRISDOL TAKE ONE CAPSULE BY MOUTH ONE TIME PER WEEK What changed: See the new instructions.       The results of significant diagnostics from this hospitalization (including imaging, microbiology, ancillary and laboratory) are listed below for reference.    Significant Diagnostic Studies: DG Chest Port 1 View  Result Date: 04/29/2020 CLINICAL DATA:  Sickle cell crisis with chest pain EXAM: PORTABLE CHEST 1 VIEW COMPARISON:  03/31/2020 FINDINGS: Chronic streaky opacity at the right base overlapping the diaphragm. There is no edema, consolidation, effusion, or pneumothorax. Normal heart size and mediastinal contours. IMPRESSION: Scarring at the right base. No acute finding when compared with prior. Electronically Signed   By: Monte Fantasia M.D.   On: 04/29/2020 04:52    Microbiology: Recent Results (from the past 240 hour(s))  SARS Coronavirus 2 by RT PCR (hospital order, performed in [facility] hospital lab) Nasopharyngeal Nasopharyngeal Swab     Status: None   Collection Time: 05/04/20 12:50 PM   Specimen: Nasopharyngeal Swab  Result Value Ref Range Status   SARS Coronavirus 2 NEGATIVE NEGATIVE Final    Comment: (NOTE) SARS-CoV-2 target nucleic acids are NOT DETECTED.  The SARS-CoV-2 RNA is generally detectable in upper and lower respiratory specimens during the acute  phase of infection. The lowest concentration of SARS-CoV-2 viral copies this assay can detect is 250 copies / mL. A negative result does not preclude SARS-CoV-2 infection and should not be used as the sole basis for treatment or other patient management decisions.  A negative result may occur with improper specimen collection / handling, submission of specimen other than nasopharyngeal swab, presence of viral mutation(s) within the areas targeted by this assay, and inadequate number of viral copies (<250 copies / mL). A negative result must be combined with clinical observations, patient history, and epidemiological information.  Fact Sheet for Patients:   StrictlyIdeas.no  Fact Sheet for Healthcare Providers: BankingDealers.co.za  This test is not yet approved or  cleared by the [place] FDA and has been authorized for detection and/or diagnosis of SARS-CoV-2 by FDA under an Emergency Use Authorization (EUA).  This EUA will remain in effect (meaning this test  can be used) for the duration of the COVID-19 declaration under Section 564(b)(1) of the Act, 21 U.S.C. section 360bbb-3(b)(1), unless the authorization is terminated or revoked sooner.  Performed at [facility], [place] [address], [place], [place] [address]   SARS Coronavirus 2 by RT PCR (hospital order, performed in [facility] hospital lab) Nasopharyngeal Nasopharyngeal Swab     Status: None   Collection Time: 05/11/20 10:46 PM   Specimen: Nasopharyngeal Swab  Result Value Ref Range Status   SARS Coronavirus 2 NEGATIVE NEGATIVE Final    Comment: (NOTE) SARS-CoV-2 target nucleic acids are NOT DETECTED.  The SARS-CoV-2 RNA is generally detectable in upper and lower respiratory specimens during the acute phase of infection. The lowest concentration of SARS-CoV-2 viral copies this assay can detect is 250 copies / mL. A negative result does not preclude  SARS-CoV-2 infection and should not be used as the sole basis for treatment or other patient management decisions.  A negative result may occur with improper specimen collection / handling, submission of specimen other than nasopharyngeal swab, presence of viral mutation(s) within the areas targeted by this assay, and inadequate number of viral copies (<250 copies / mL). A negative result must be combined with clinical observations, patient history, and epidemiological information.  Fact Sheet for Patients:   StrictlyIdeas.no  Fact Sheet for Healthcare Providers: BankingDealers.co.za  This test is not yet approved or  cleared by the [place] FDA and has been authorized for detection and/or diagnosis of SARS-CoV-2 by FDA under an Emergency Use Authorization (EUA).  This EUA will remain in effect (meaning this test can be used) for the duration of the COVID-19 declaration under Section 564(b)(1) of the Act, 21 U.S.C. section 360bbb-3(b)(1), unless the authorization is terminated or revoked sooner.  Performed at [facility], [place] [address] [place], [place] [address]      Labs: Basic Metabolic Panel: Recent Labs  Lab 05/11/20 1953  NA 140  K 4.1  CL 108  CO2 25  GLUCOSE 91  BUN 9  CREATININE 0.67  CALCIUM 9.6   Liver Function Tests: Recent Labs  Lab 05/11/20 1953  AST 28  ALT 21  ALKPHOS 55  BILITOT 0.9  PROT 8.5*  ALBUMIN 4.6   No results for input(s): LIPASE, AMYLASE in the last 168 hours. No results for input(s): AMMONIA in the last 168 hours. CBC: Recent Labs  Lab 05/11/20 1953 05/12/20 0636 05/13/20 0554  WBC 7.8 9.8 10.4  NEUTROABS 4.4  --   --   HGB 9.4* 8.3* 8.4*  HCT 27.3* 23.6* 24.1*  MCV 85.8 84.9 86.4  PLT 616* 512* 522*   Cardiac Enzymes: No results for input(s): CKTOTAL, CKMB, CKMBINDEX, TROPONINI in the last 168 hours. BNP: Invalid input(s): POCBNP CBG: No  results for input(s): GLUCAP in the last 168 hours.  Time coordinating discharge: 30 minutes  Signed:  [name]  APRN, MSN, FNP-C [facility] [address] [place],  [address] [phone]  [facility] 05/13/2020, 8:32 PM

## 2020-05-13 NOTE — Discharge Instructions (Signed)
Sickle Cell Anemia, Adult  Sickle cell anemia is a condition where your red blood cells are shaped like sickles. Red blood cells carry oxygen through the body. Sickle-shaped cells do not live as long as normal red blood cells. They also clump together and block blood from flowing through the blood vessels. This prevents the body from getting enough oxygen. Sickle cell anemia causes organ damage and pain. It also increases the risk of infection. Follow these instructions at home: Medicines  Take over-the-counter and prescription medicines only as told by your doctor.  If you were prescribed an antibiotic medicine, take it as told by your doctor. Do not stop taking the antibiotic even if you start to feel better.  If you develop a fever, do not take medicines to lower the fever right away. Tell your doctor about the fever. Managing pain, stiffness, and swelling  Try these methods to help with pain: ? Use a heating pad. ? Take a warm bath. ? Distract yourself, such as by watching TV. Eating and drinking  Drink enough fluid to keep your pee (urine) clear or pale yellow. Drink more in hot weather and during exercise.  Limit or avoid alcohol.  Eat a healthy diet. Eat plenty of fruits, vegetables, whole grains, and lean protein.  Take vitamins and supplements as told by your doctor. Traveling  When traveling, keep these with you: ? Your medical information. ? The names of your doctors. ? Your medicines.  If you need to take an airplane, talk to your doctor first. Activity  Rest often.  Avoid exercises that make your heart beat much faster, such as jogging. General instructions  Do not use products that have nicotine or tobacco, such as cigarettes and e-cigarettes. If you need help quitting, ask your doctor.  Consider wearing a medical alert bracelet.  Avoid being in high places (high altitudes), such as mountains.  Avoid very hot or cold temperatures.  Avoid places where the  temperature changes a lot.  Keep all follow-up visits as told by your doctor. This is important. Contact a doctor if:  A joint hurts.  Your feet or hands hurt or swell.  You feel tired (fatigued). Get help right away if:  You have symptoms of infection. These include: ? Fever. ? Chills. ? Being very tired. ? Irritability. ? Poor eating. ? Throwing up (vomiting).  You feel dizzy or faint.  You have new stomach pain, especially on the left side.  You have a an erection (priapism) that lasts more than 4 hours.  You have numbness in your arms or legs.  You have a hard time moving your arms or legs.  You have trouble talking.  You have pain that does not go away when you take medicine.  You are short of breath.  You are breathing fast.  You have a long-term cough.  You have pain in your chest.  You have a bad headache.  You have a stiff neck.  Your stomach looks bloated even though you did not eat much.  Your skin is pale.  You suddenly cannot see well. Summary  Sickle cell anemia is a condition where your red blood cells are shaped like sickles.  Follow your doctor's advice on ways to manage pain, food to eat, activities to do, and steps to take for safe travel.  Get medical help right away if you have any signs of infection, such as a fever. This information is not intended to replace advice given to you by   your health care provider. Make sure you discuss any questions you have with your health care provider. Document Revised: 02/09/2019 Document Reviewed: 11/23/2016 Elsevier Patient Education  2020 [employer] Inc.  

## 2020-05-14 ENCOUNTER — Other Ambulatory Visit: Payer: Self-pay | Admitting: Family Medicine

## 2020-05-14 DIAGNOSIS — D571 Sickle-cell disease without crisis: Secondary | ICD-10-CM

## 2020-05-14 DIAGNOSIS — Z79891 Long term (current) use of opiate analgesic: Secondary | ICD-10-CM

## 2020-05-15 ENCOUNTER — Telehealth: Payer: Self-pay | Admitting: Nurse Practitioner

## 2020-05-15 NOTE — Telephone Encounter (Signed)
Pt was contacted to schedule a hosp fu. LVM, no answer

## 2020-05-22 ENCOUNTER — Telehealth: Payer: Self-pay | Admitting: Nurse Practitioner

## 2020-05-22 ENCOUNTER — Other Ambulatory Visit: Payer: Self-pay | Admitting: Nurse Practitioner

## 2020-05-22 DIAGNOSIS — G894 Chronic pain syndrome: Secondary | ICD-10-CM

## 2020-05-22 DIAGNOSIS — F3341 Major depressive disorder, recurrent, in partial remission: Secondary | ICD-10-CM

## 2020-05-22 DIAGNOSIS — D571 Sickle-cell disease without crisis: Secondary | ICD-10-CM

## 2020-05-22 DIAGNOSIS — Z79891 Long term (current) use of opiate analgesic: Secondary | ICD-10-CM

## 2020-05-22 MED ORDER — OXYCODONE HCL 10 MG PO TABS: 10.0000 mg | ORAL_TABLET | Freq: Four times a day (QID) | ORAL | 0 refills | Status: DC | PRN

## 2020-05-22 MED ORDER — CITALOPRAM HYDROBROMIDE 10 MG PO TABS: 10.0000 mg | ORAL_TABLET | Freq: Every day | ORAL | 2 refills | Status: DC

## 2020-05-22 NOTE — Telephone Encounter (Signed)
Refill sent.

## 2020-05-22 NOTE — Progress Notes (Signed)
° °  [place], [place]  [address]asticity relief.   Phone:  [phone]   Fax:  [phone]  Subjective:    Carmen [name] calls in for refill of her  oxycodone. She is satisfied with the degree of analgesic relief. She also is requesting refill on her Celexa 10 mg..  She is satisfied with the current treatment for depression. Objective:  Patient has history of hemoglobin SS disease.  Last visit to the emergency room was 05/11/2020.  She has had 3 emergency room visits in the last month for sickle cell pain crisis.  Her current opioid regimen is morphine extended release 60 mg twice daily along with oxycodone 10 mg twice daily.  She does work full-time from home. She also has a history of major depression currently on Celexa and doing well    Assessment:     Assessment  Primary Diagnosis & Pertinent Problem List: The primary encounter diagnosis was Chronic pain syndrome. Diagnoses of Recurrent major depressive disorder, in partial remission ([place]), Hb-SS disease without crisis ([place]), and Chronic prescription opiate use were also pertinent to this visit.  Visit Diagnosis: 1. Chronic pain syndrome   2. Recurrent major depressive disorder, in partial remission ([place])   3. Hb-SS disease without crisis ([place])   4. Chronic prescription opiate use     Plan:   Refills completed patient to follow-up in 6 weeks for repeat UDS

## 2020-06-09 ENCOUNTER — Emergency Department (HOSPITAL_COMMUNITY)
Admission: EM | Admit: 2020-06-09 | Discharge: 2020-06-09 | Disposition: A | Discharge status: Other Acute Inpatient Hospital | Payer: Medicaid Other | Source: Home / Self Care | Attending: Emergency Medicine | Admitting: Emergency Medicine

## 2020-06-09 ENCOUNTER — Other Ambulatory Visit: Payer: Self-pay | Admitting: Family Medicine

## 2020-06-09 ENCOUNTER — Encounter (HOSPITAL_COMMUNITY): Payer: Self-pay

## 2020-06-09 ENCOUNTER — Emergency Department (HOSPITAL_COMMUNITY): Payer: Medicaid Other

## 2020-06-09 ENCOUNTER — Inpatient Hospital Stay (HOSPITAL_COMMUNITY)
Admission: AD | Admit: 2020-06-09 | Discharge: 2020-06-11 | DRG: 812 | Disposition: A | Discharge status: Home / Self Care | Payer: Medicaid Other | Source: Ambulatory Visit | Attending: Internal Medicine | Admitting: Internal Medicine

## 2020-06-09 ENCOUNTER — Other Ambulatory Visit: Payer: Self-pay

## 2020-06-09 ENCOUNTER — Telehealth: Payer: Self-pay | Admitting: Nurse Practitioner

## 2020-06-09 ENCOUNTER — Encounter (HOSPITAL_COMMUNITY): Payer: Self-pay | Admitting: Internal Medicine

## 2020-06-09 DIAGNOSIS — Z8261 Family history of arthritis: Secondary | ICD-10-CM

## 2020-06-09 DIAGNOSIS — Z8249 Family history of ischemic heart disease and other diseases of the circulatory system: Secondary | ICD-10-CM

## 2020-06-09 DIAGNOSIS — G894 Chronic pain syndrome: Secondary | ICD-10-CM | POA: Diagnosis present

## 2020-06-09 DIAGNOSIS — D5700 Hb-SS disease with crisis, unspecified: Secondary | ICD-10-CM

## 2020-06-09 DIAGNOSIS — Z79891 Long term (current) use of opiate analgesic: Secondary | ICD-10-CM

## 2020-06-09 DIAGNOSIS — D571 Sickle-cell disease without crisis: Secondary | ICD-10-CM

## 2020-06-09 DIAGNOSIS — M7918 Myalgia, other site: Secondary | ICD-10-CM | POA: Diagnosis present

## 2020-06-09 DIAGNOSIS — Z841 Family history of disorders of kidney and ureter: Secondary | ICD-10-CM

## 2020-06-09 DIAGNOSIS — Z832 Family history of diseases of the blood and blood-forming organs and certain disorders involving the immune mechanism: Secondary | ICD-10-CM

## 2020-06-09 DIAGNOSIS — D638 Anemia in other chronic diseases classified elsewhere: Secondary | ICD-10-CM | POA: Diagnosis present

## 2020-06-09 DIAGNOSIS — Z20822 Contact with and (suspected) exposure to covid-19: Secondary | ICD-10-CM | POA: Diagnosis present

## 2020-06-09 DIAGNOSIS — F17200 Nicotine dependence, unspecified, uncomplicated: Secondary | ICD-10-CM | POA: Diagnosis present

## 2020-06-09 HISTORY — DX: Anxiety disorder, unspecified: F41.9

## 2020-06-09 HISTORY — DX: Cardiac murmur, unspecified: R01.1

## 2020-06-09 LAB — CBC WITH DIFFERENTIAL/PLATELET
Abs Immature Granulocytes: 0.04 10*3/uL (ref 0.00–0.07)
Basophils Absolute: 0 10*3/uL (ref 0.0–0.1)
Basophils Relative: 0 %
Eosinophils Absolute: 0.1 10*3/uL (ref 0.0–0.5)
Eosinophils Relative: 1 %
HCT: 31.1 % — ABNORMAL LOW (ref 36.0–46.0)
Hemoglobin: 11.1 g/dL — ABNORMAL LOW (ref 12.0–15.0)
Immature Granulocytes: 1 %
Lymphocytes Relative: 22 %
Lymphs Abs: 1.8 10*3/uL (ref 0.7–4.0)
MCH: 30.4 pg (ref 26.0–34.0)
MCHC: 35.7 g/dL (ref 30.0–36.0)
MCV: 85.2 fL (ref 80.0–100.0)
Monocytes Absolute: 0.6 10*3/uL (ref 0.1–1.0)
Monocytes Relative: 7 %
Neutro Abs: 5.9 10*3/uL (ref 1.7–7.7)
Neutrophils Relative %: 69 %
Platelets: 542 10*3/uL — ABNORMAL HIGH (ref 150–400)
RBC: 3.65 MIL/uL — ABNORMAL LOW (ref 3.87–5.11)
RDW: 15.5 % (ref 11.5–15.5)
WBC: 8.5 10*3/uL (ref 4.0–10.5)
nRBC: 0.9 % — ABNORMAL HIGH (ref 0.0–0.2)

## 2020-06-09 LAB — COMPREHENSIVE METABOLIC PANEL
ALT: 23 U/L (ref 0–44)
AST: 23 U/L (ref 15–41)
Albumin: 4.9 g/dL (ref 3.5–5.0)
Alkaline Phosphatase: 62 U/L (ref 38–126)
Anion gap: 10 (ref 5–15)
BUN: 9 mg/dL (ref 6–20)
CO2: 22 mmol/L (ref 22–32)
Calcium: 9.1 mg/dL (ref 8.9–10.3)
Chloride: 101 mmol/L (ref 98–111)
Creatinine, Ser: 0.55 mg/dL (ref 0.44–1.00)
GFR calc Af Amer: >60 mL/min (ref >60)
GFR calc non Af Amer: >60 mL/min (ref >60)
Glucose, Bld: 108 mg/dL — ABNORMAL HIGH (ref 70–99)
Potassium: 3.5 mmol/L (ref 3.5–5.1)
Sodium: 133 mmol/L — ABNORMAL LOW (ref 135–145)
Total Bilirubin: 1.1 mg/dL (ref 0.3–1.2)
Total Protein: 8.6 g/dL — ABNORMAL HIGH (ref 6.5–8.1)

## 2020-06-09 LAB — I-STAT BETA HCG BLOOD, ED (MC, WL, AP ONLY): I-stat hCG, quantitative: <5 m[IU]/mL (ref <5)

## 2020-06-09 LAB — RETICULOCYTES
Immature Retic Fract: 35 % — ABNORMAL HIGH (ref 2.3–15.9)
RBC.: 3.62 MIL/uL — ABNORMAL LOW (ref 3.87–5.11)
Retic Count, Absolute: 238.9 10*3/uL — ABNORMAL HIGH (ref 19.0–186.0)
Retic Ct Pct: 6.6 % — ABNORMAL HIGH (ref 0.4–3.1)

## 2020-06-09 MED ORDER — KETOROLAC TROMETHAMINE 30 MG/ML IJ SOLN
15.0000 mg | Freq: Once | INTRAMUSCULAR | Status: DC
  Filled 2020-06-09 (×2): qty 1

## 2020-06-09 MED ORDER — DIPHENHYDRAMINE HCL 50 MG/ML IJ SOLN
25.0000 mg | Freq: Once | INTRAMUSCULAR | Status: AC
  Administered 2020-06-09: 25 mg via INTRAVENOUS
  Filled 2020-06-09: qty 1

## 2020-06-09 MED ORDER — SENNOSIDES-DOCUSATE SODIUM 8.6-50 MG PO TABS
1.0000 | ORAL_TABLET | Freq: Two times a day (BID) | ORAL | Status: DC
  Administered 2020-06-09 – 2020-06-11 (×4): 1 via ORAL
  Filled 2020-06-09 (×4): qty 1

## 2020-06-09 MED ORDER — HYDROMORPHONE HCL 2 MG/ML IJ SOLN
2.0000 mg | INTRAMUSCULAR | Status: AC
  Administered 2020-06-09: 2 mg via INTRAVENOUS
  Filled 2020-06-09: qty 1

## 2020-06-09 MED ORDER — HYDROMORPHONE 1 MG/ML IV SOLN
INTRAVENOUS | Status: DC
  Administered 2020-06-09: 30 mg via INTRAVENOUS
  Administered 2020-06-09: 6.6 mg via INTRAVENOUS
  Administered 2020-06-09: 8.5 mg via INTRAVENOUS
  Administered 2020-06-09: 4.5 mg via INTRAVENOUS
  Administered 2020-06-10: 1.5 mg via INTRAVENOUS
  Administered 2020-06-10: 30 mg via INTRAVENOUS
  Administered 2020-06-10: 8 mg via INTRAVENOUS
  Filled 2020-06-09 (×2): qty 30

## 2020-06-09 MED ORDER — SODIUM CHLORIDE 0.45 % IV SOLN: INTRAVENOUS | Status: DC

## 2020-06-09 MED ORDER — SODIUM CHLORIDE 0.9% FLUSH
3.0000 mL | Freq: Once | INTRAVENOUS | Status: AC
  Administered 2020-06-09: 3 mL via INTRAVENOUS

## 2020-06-09 MED ORDER — ONDANSETRON HCL 4 MG/2ML IJ SOLN: 4.0000 mg | Freq: Four times a day (QID) | INTRAMUSCULAR | Status: DC | PRN

## 2020-06-09 MED ORDER — POLYETHYLENE GLYCOL 3350 17 G PO PACK: 17.0000 g | PACK | Freq: Every day | ORAL | Status: DC | PRN

## 2020-06-09 MED ORDER — NALOXONE HCL 0.4 MG/ML IJ SOLN: 0.4000 mg | INTRAMUSCULAR | Status: DC | PRN

## 2020-06-09 MED ORDER — LORATADINE 10 MG PO TABS: 10.0000 mg | ORAL_TABLET | Freq: Every day | ORAL | Status: DC | PRN

## 2020-06-09 MED ORDER — ENOXAPARIN SODIUM 40 MG/0.4ML ~~LOC~~ SOLN
40.0000 mg | SUBCUTANEOUS | Status: DC
  Administered 2020-06-09: 40 mg via SUBCUTANEOUS
  Filled 2020-06-09: qty 0.4

## 2020-06-09 MED ORDER — MORPHINE SULFATE ER 60 MG PO TBCR: 60.0000 mg | EXTENDED_RELEASE_TABLET | Freq: Two times a day (BID) | ORAL | 0 refills | Status: DC

## 2020-06-09 MED ORDER — FOLIC ACID 1 MG PO TABS
1.0000 mg | ORAL_TABLET | Freq: Every day | ORAL | Status: DC
  Administered 2020-06-09 – 2020-06-11 (×3): 1 mg via ORAL
  Filled 2020-06-09 (×3): qty 1

## 2020-06-09 MED ORDER — KETOROLAC TROMETHAMINE 15 MG/ML IJ SOLN
15.0000 mg | Freq: Four times a day (QID) | INTRAMUSCULAR | Status: DC
  Administered 2020-06-09 – 2020-06-11 (×7): 15 mg via INTRAVENOUS
  Filled 2020-06-09 (×7): qty 1

## 2020-06-09 MED ORDER — CITALOPRAM HYDROBROMIDE 20 MG PO TABS
10.0000 mg | ORAL_TABLET | Freq: Every day | ORAL | Status: DC
  Administered 2020-06-09 – 2020-06-11 (×3): 10 mg via ORAL
  Filled 2020-06-09 (×3): qty 1

## 2020-06-09 MED ORDER — DIPHENHYDRAMINE HCL 25 MG PO CAPS
25.0000 mg | ORAL_CAPSULE | ORAL | Status: DC | PRN
  Filled 2020-06-09: qty 1

## 2020-06-09 MED ORDER — KETOROLAC TROMETHAMINE 15 MG/ML IJ SOLN
15.0000 mg | INTRAMUSCULAR | Status: AC
  Administered 2020-06-09: 15 mg via INTRAVENOUS
  Filled 2020-06-09: qty 1

## 2020-06-09 MED ORDER — SODIUM CHLORIDE 0.9% FLUSH: 9.0000 mL | INTRAVENOUS | Status: DC | PRN

## 2020-06-09 MED ORDER — ONDANSETRON HCL 4 MG/2ML IJ SOLN
4.0000 mg | INTRAMUSCULAR | Status: DC | PRN
  Administered 2020-06-09: 4 mg via INTRAVENOUS
  Filled 2020-06-09: qty 2

## 2020-06-09 NOTE — ED Triage Notes (Signed)
Pt BIB [facility] from home. She reports a SSC x 3 days. Reports using home medications without relief. Reports pain is 8/10 from her shoulders to her feet. Denies vomiting or SOB. Endorses nausea and loss of appetite.

## 2020-06-09 NOTE — H&P (Signed)
H&P  Patient Demographics:  Carmen [name], is a 29 y.o. female  MRN: [id number]   DOB - [birth year]  Admit Date - 06/09/2020  Outpatient Primary MD for the patient is Vevelyn Francois, NP  Chief Complaint  Patient presents with  . Sickle Cell Pain Crisis      HPI:   Carmen [name]  is a 29 y.o. female with a medical history significant for sickle cell disease, chronic pain syndrome, opiate dependence and tolerance, and history of anemia of chronic disease presented to sickle cell day infusion center with complaints of generalized pain that is consistent with her typical pain crisis.  Patient was treated and evaluated in the emergency room this a.m. without complete resolution of this problem.  Patient transition to sickle cell day infusion center for pain management and extended observation.  Pain intensity 9/10 characterized as constant and throbbing.  Patient says that her pain has been unrelieved by home medications over the past several days.  She attributes pain crisis to increased stressors at home.  She denies any headache, chest pain, urinary symptoms, nausea, vomiting, or diarrhea.  No sick contacts, recent travel, or exposure to COVID-19.  Sickle cell day infusion center course: Vital signs recorded as BP 112/73 (BP Location: Left Arm)   Pulse 63   Temp 98.3 F (36.8 C) (Oral)   Resp 12   SpO2 99% .  Labs are largely within normal limits.  Pain persists despite IV Dilaudid PCA, IV fluids, and IV Toradol.  Admit to [place] and observation for further management of sickle cell pain crisis.  Review of systems:  In addition to the HPI above, patient reports No fever or chills No Headache, No changes with vision or hearing No problems swallowing food or liquids No chest pain, cough or shortness of breath No Abdominal pain, No Nausea or Vomiting, Bowel movements are regular No blood in stool or urine No dysuria No new skin rashes or bruises No new joints pains-aches No new weakness,  tingling, numbness in any extremity No recent weight gain or loss No polyuria, polydypsia or polyphagia No significant Mental Stressors  A full 10 point Review of Systems was done, except as stated above, all other Review of Systems were negative.  With Past History of the following :   Past Medical History:  Diagnosis Date  . Anxiety   . Headache(784.0)   . Heart murmur   . Sickle cell crisis ([place])   . Syphilis 2015   Was diagnosed and received one injection of antibiotics  . Thrombocytosis ([place]) 11/22/2014     CBC Latest Ref Rng & Units 12/13/2018 12/11/2018 12/10/2018 WBC 4.0 - 10.5 K/uL 14.8(H) 13.2(H) 15.9(H) Hemoglobin 12.0 - 15.0 g/dL 8.2(L) 7.7(L) 8.1(L) Hematocrit 36.0 - 46.0 % 23.7(L) 23.2(L) 24.5(L) Platelets 150 - 400 K/uL 326 399 388        Past Surgical History:  Procedure Laterality Date  . CESAREAN SECTION N/A 02/05/2019   Procedure: CESAREAN SECTION;  Surgeon: [name], MD;  Location: [facility] LD ORS;  Service: Obstetrics;  Laterality: N/A;  . CHOLECYSTECTOMY N/A 11/30/2014   Procedure: LAPAROSCOPIC CHOLECYSTECTOMY SINGLE SITE WITH INTRAOPERATIVE CHOLANGIOGRAM;  Surgeon: Michael Boston, MD;  Location: [facility] ORS;  Service: General;  Laterality: N/A;  . SPLENECTOMY       Social History:   Social History   Tobacco Use  . Smoking status: Current Some Day Smoker    Types: Cigarettes  . Smokeless tobacco: Never Used  Substance Use Topics  .  Alcohol use: No     Lives - At home   Family History :   Family History  Problem Relation Age of Onset  . Hypertension Mother   . Sickle cell anemia Sister   . Kidney disease Sister        Lupus  . Arthritis Sister   . Sickle cell anemia Sister   . Sickle cell trait Sister   . Heart disease Maternal Aunt        CABG  . Heart disease Maternal Uncle        CABG  . Lupus Sister      Home Medications:   Prior to Admission medications   Medication Sig Start Date End Date Taking? Authorizing Provider  citalopram (CELEXA)  10 MG tablet Take 1 tablet (10 mg total) by mouth daily. 05/22/20 08/20/20  Vevelyn Francois, NP  cyclobenzaprine (FLEXERIL) 10 MG tablet Take 1 tablet (10 mg total) by mouth 2 (two) times daily. Patient not taking: Reported on 06/09/2020 03/28/20 06/26/20  Vevelyn Francois, NP  folic acid (FOLVITE) 1 MG tablet Take 1 tablet (1 mg total) by mouth daily. 03/15/19   Lanae Boast, FNP  loratadine (CLARITIN) 10 MG tablet Take 1 tablet (10 mg total) by mouth daily. Patient taking differently: Take 10 mg by mouth daily as needed for allergies.  03/28/20   Vevelyn Francois, NP  morphine (MS CONTIN) 60 MG 12 hr tablet Take 1 tablet (60 mg total) by mouth every 12 (twelve) hours. 06/11/20 07/11/20  Azzie Glatter, FNP  Oxycodone HCl 10 MG TABS Take 1 tablet (10 mg total) by mouth every 6 (six) hours as needed (PAIN). 05/22/20   Vevelyn Francois, NP  Prenatal Vit-Fe Fumarate-FA (PRENATAL VITAMIN PLUS LOW IRON) 27-1 MG TABS TAKE 1 TABLET BY MOUTH EVERY DAY Patient taking differently: Take 1 tablet by mouth daily.  03/03/20   Shelly Bombard, MD  Vitamin D, Ergocalciferol, (DRISDOL) 1.25 MG (50000 UT) CAPS capsule TAKE ONE CAPSULE BY MOUTH ONE TIME PER WEEK Patient taking differently: Take 50,000 Units by mouth every 7 (seven) days.  10/11/19   Azzie Glatter, FNP     Allergies:   No Known Allergies   Physical Exam:   Vitals:   Vitals:   06/09/20 1733 06/09/20 2031  BP: 112/73   Pulse: 63   Resp: 14 12  Temp: 98.3 F (36.8 C)   SpO2: 95% 99%    Physical Exam: Constitutional: Patient appears well-developed and well-nourished. Not in obvious distress. HENT: Normocephalic, atraumatic, External right and left ear normal. Oropharynx is clear and moist.  Eyes: Conjunctivae and EOM are normal. PERRLA, no scleral icterus. Neck: Normal ROM. Neck supple. No JVD. No tracheal deviation. No thyromegaly. CVS: RRR, S1/S2 +, no murmurs, no gallops, no carotid bruit.  Pulmonary: Effort and breath sounds normal,  no stridor, rhonchi, wheezes, rales.  Abdominal: Soft. BS +, no distension, tenderness, rebound or guarding.  Musculoskeletal: Normal range of motion. No edema and no tenderness.  Lymphadenopathy: No lymphadenopathy noted, cervical, inguinal or axillary Neuro: Alert. Normal reflexes, muscle tone coordination. No cranial nerve deficit. Skin: Skin is warm and dry. No rash noted. Not diaphoretic. No erythema. No pallor. Psychiatric: Normal mood and affect. Behavior, judgment, thought content normal.   Data Review:   CBC Recent Labs  Lab 06/09/20 0805  WBC 8.5  HGB 11.1*  HCT 31.1*  PLT 542*  MCV 85.2  MCH 30.4  MCHC 35.7  RDW 15.5  LYMPHSABS 1.8  MONOABS 0.6  EOSABS 0.1  BASOSABS 0.0   ------------------------------------------------------------------------------------------------------------------  Chemistries  Recent Labs  Lab 06/09/20 0805  NA 133*  K 3.5  CL 101  CO2 22  GLUCOSE 108*  BUN 9  CREATININE 0.55  CALCIUM 9.1  AST 23  ALT 23  ALKPHOS 62  BILITOT 1.1   ------------------------------------------------------------------------------------------------------------------ estimated creatinine clearance is 102.4 mL/min (by C-G formula based on SCr of 0.55 mg/dL). ------------------------------------------------------------------------------------------------------------------ No results for input(s): TSH, T4TOTAL, T3FREE, THYROIDAB in the last 72 hours.  Invalid input(s): FREET3  Coagulation profile No results for input(s): INR, PROTIME in the last 168 hours. ------------------------------------------------------------------------------------------------------------------- No results for input(s): DDIMER in the last 72 hours. -------------------------------------------------------------------------------------------------------------------  Cardiac Enzymes No results for input(s): CKMB, TROPONINI, MYOGLOBIN in the last 168 hours.  Invalid input(s):  CK ------------------------------------------------------------------------------------------------------------------ No results found for: BNP  ---------------------------------------------------------------------------------------------------------------  Urinalysis    Component Value Date/Time   COLORURINE YELLOW 12/31/2019 1213   APPEARANCEUR CLEAR 12/31/2019 1213   LABSPEC 1.011 12/31/2019 1213   PHURINE 6.0 12/31/2019 1213   GLUCOSEU NEGATIVE 12/31/2019 1213   HGBUR NEGATIVE 12/31/2019 1213   BILIRUBINUR NEGATIVE 12/31/2019 1213   BILIRUBINUR Negative 08/10/2019 1534   KETONESUR NEGATIVE 12/31/2019 1213   PROTEINUR NEGATIVE 12/31/2019 1213   UROBILINOGEN 1.0 08/10/2019 1534   UROBILINOGEN 1.0 01/12/2018 0943   NITRITE NEGATIVE 12/31/2019 1213   LEUKOCYTESUR SMALL (A) 12/31/2019 1213    ----------------------------------------------------------------------------------------------------------------   Imaging Results:    DG Chest Port 1 View  Result Date: 06/09/2020 CLINICAL DATA:  Chest pain.  History of sickle cell disease EXAM: PORTABLE CHEST 1 VIEW COMPARISON:  April 29, 2020 FINDINGS: Lungs are clear. Heart size and pulmonary vascularity are normal. No adenopathy. No pneumothorax. No bone lesions. IMPRESSION: Lungs clear.  Cardiac silhouette normal. Electronically Signed   By: Lowella Grip III M.D.   On: 06/09/2020 09:41      Assessment & Plan:  Principal Problem:   Sickle cell pain crisis ([place]) Active Problems:   Chronic prescription opiate use   Chronic musculoskeletal pain   Sickle cell disease with pain crisis: Admit to MedSurg.  Continue IV Dilaudid PCA with settings of 0.5 mg, 10-minute lockout, and 3 mg/h. IV Toradol 15 mg every 6 hours MS Contin 30 mg every 12 hours Reevaluate pain scale regularly, supplemental oxygen as needed, and monitor vital signs closely Pain will be reevaluated in context of function and relationship to baseline as care  progresses.  Chronic pain syndrome: Continue home medications.  MS Contin 30 mg every 12 hours Hold oxycodone, use PCA Dilaudid  Sickle cell anemia: Hemoglobin 11.1, slightly above patient's baseline.  No clinical indication for blood transfusion at this time.  Continue to follow closely.  Continue folic acid 1 mg daily  DVT Prophylaxis: Subcut Lovenox and SCDs  AM Labs Ordered, also please review Full Orders  Family Communication: Admission, patient's condition and plan of care including tests being ordered have been discussed with the patient who indicate understanding and agree with the plan and Code Status.  Code Status: Full Code  Consults called: None    Admission status: Inpatient    Time spent in minutes : 35 minutes  [place], MSN, FNP-C [facility] [address] [place], [place] [address] [phone]  06/09/2020 at 9:08 PM

## 2020-06-09 NOTE — Discharge Instructions (Signed)
Proceed to the sickle cell center for further treatment

## 2020-06-09 NOTE — H&P (Signed)
[facility] History and Physical   Date: 06/09/2020  Patient name: Carmen [name] Medical record number: [id number] Date of birth: 07/27/1991 Age: 29 y.o. Gender: female PCP: Vevelyn Francois, NP  Attending physician: Tresa Garter, MD  Chief Complaint: Sickle cell pain  History of Present Illness: Carmen [name]  is a 29 y.o. female with a medical history significant for sickle cell disease, chronic pain syndrome, opiate dependence and tolerance, and history of anemia of chronic disease presented to sickle cell day infusion center with complaints of generalized pain that is consistent with her typical pain crisis.  Patient was treated and evaluated in the emergency room this a.m. without complete resolution of this problem.  Patient transition to sickle cell day infusion center for pain management and extended observation.  Pain intensity 9/10 characterized as constant and throbbing.  Patient says that her pain has been unrelieved by home medications over the past several days.  She attributes pain crisis to increased stressors at home.  She denies any headache, chest pain, urinary symptoms, nausea, vomiting, or diarrhea.  No sick contacts, recent travel, or exposure to COVID-19.   Meds: Medications Prior to Admission  Medication Sig Dispense Refill Last Dose  . citalopram (CELEXA) 10 MG tablet Take 1 tablet (10 mg total) by mouth daily. 30 tablet 2   . cyclobenzaprine (FLEXERIL) 10 MG tablet Take 1 tablet (10 mg total) by mouth 2 (two) times daily. (Patient not taking: Reported on 06/09/2020) 60 tablet 2   . folic acid (FOLVITE) 1 MG tablet Take 1 tablet (1 mg total) by mouth daily. 90 tablet 3   . loratadine (CLARITIN) 10 MG tablet Take 1 tablet (10 mg total) by mouth daily. (Patient taking differently: Take 10 mg by mouth daily as needed for allergies. ) 30 tablet 11   . Oxycodone HCl 10 MG TABS Take 1 tablet (10 mg total) by mouth every 6 (six) hours as needed (PAIN). 60  tablet 0   . Prenatal Vit-Fe Fumarate-FA (PRENATAL VITAMIN PLUS LOW IRON) 27-1 MG TABS TAKE 1 TABLET BY MOUTH EVERY DAY (Patient taking differently: Take 1 tablet by mouth daily. ) 30 tablet 13   . Vitamin D, Ergocalciferol, (DRISDOL) 1.25 MG (50000 UT) CAPS capsule TAKE ONE CAPSULE BY MOUTH ONE TIME PER WEEK (Patient taking differently: Take 50,000 Units by mouth every 7 (seven) days. ) 30 capsule 1     Allergies: Patient has no known allergies. Past Medical History:  Diagnosis Date  . Anxiety   . Headache(784.0)   . Heart murmur   . Sickle cell crisis ([place])   . Syphilis 2015   Was diagnosed and received one injection of antibiotics  . Thrombocytosis ([place]) 11/22/2014     CBC Latest Ref Rng & Units 12/13/2018 12/11/2018 12/10/2018 WBC 4.0 - 10.5 K/uL 14.8(H) 13.2(H) 15.9(H) Hemoglobin 12.0 - 15.0 g/dL 8.2(L) 7.7(L) 8.1(L) Hematocrit 36.0 - 46.0 % 23.7(L) 23.2(L) 24.5(L) Platelets 150 - 400 K/uL [phone]     Past Surgical History:  Procedure Laterality Date  . CESAREAN SECTION N/A 02/05/2019   Procedure: CESAREAN SECTION;  Surgeon: [name], MD;  [facility]: [facility];  Service: Obstetrics;  Laterality: N/A;  . CHOLECYSTECTOMY N/A 11/30/2014   Procedure: LAPAROSCOPIC CHOLECYSTECTOMY SINGLE SITE WITH INTRAOPERATIVE CHOLANGIOGRAM;  Surgeon: Michael Boston, MD;  Location: [facility] ORS;  Service: General;  Laterality: N/A;  . SPLENECTOMY     Family History  Problem Relation Age of Onset  . Hypertension Mother   .  Sickle cell anemia Sister   . Kidney disease Sister        Lupus  . Arthritis Sister   . Sickle cell anemia Sister   . Sickle cell trait Sister   . Heart disease Maternal Aunt        CABG  . Heart disease Maternal Uncle        CABG  . Lupus Sister    Social History   Socioeconomic History  . Marital status: Single    Spouse name: Not on file  . Number of children: Not on file  . Years of education: Not on file  . Highest education level: Not on file  Occupational History   . Not on file  Tobacco Use  . Smoking status: Current Some Day Smoker    Types: Cigarettes  . Smokeless tobacco: Never Used  Vaping Use  . Vaping Use: Never used  Substance and Sexual Activity  . Alcohol use: No  . Drug use: No  . Sexual activity: Yes    Birth control/protection: Injection  Other Topics Concern  . Not on file  Social History Narrative  . Not on file   Social Determinants of Health   Financial Resource Strain:   . Difficulty of Paying Living Expenses:   Food Insecurity:   . Worried About Charity fundraiser in the Last Year:   . Arboriculturist in the Last Year:   Transportation Needs:   . Film/video editor (Medical):   Marland Kitchen Lack of Transportation (Non-Medical):   Physical Activity:   . Days of Exercise per Week:   . Minutes of Exercise per Session:   Stress:   . Feeling of Stress :   Social Connections:   . Frequency of Communication with Friends and Family:   . Frequency of Social Gatherings with Friends and Family:   . Attends Religious Services:   . Active Member of Clubs or Organizations:   . Attends Archivist Meetings:   Marland Kitchen Marital Status:   Intimate Partner Violence:   . Fear of Current or Ex-Partner:   . Emotionally Abused:   Marland Kitchen Physically Abused:   . Sexually Abused:    Review of Systems  Constitutional: Negative for chills and fever.  HENT: Negative.   Eyes: Negative.   Respiratory: Negative.   Cardiovascular: Negative.   Gastrointestinal: Negative.   Genitourinary: Negative.   Musculoskeletal: Positive for back pain and joint pain.  Skin: Negative.   Neurological: Negative.   Psychiatric/Behavioral: Negative.     Physical Exam: Blood pressure 112/73, pulse 63, temperature 98.3 F (36.8 C), temperature source Oral, resp. rate 12, SpO2 99 %, not currently breastfeeding. Physical Exam Constitutional:      General: She is in acute distress.     Appearance: Normal appearance.  Eyes:     Pupils: Pupils are equal,  round, and reactive to light.  Cardiovascular:     Rate and Rhythm: Normal rate and regular rhythm.  Pulmonary:     Effort: Pulmonary effort is normal.     Breath sounds: Normal breath sounds.  Abdominal:     General: Abdomen is flat. Bowel sounds are normal.  Skin:    General: Skin is warm.  Neurological:     General: No focal deficit present.     Mental Status: She is alert. Mental status is at baseline.  Psychiatric:        Mood and Affect: Mood normal.      Lab results:  Results for orders placed or performed during the hospital encounter of 06/09/20 (from the past 24 hour(s))  Comprehensive metabolic panel     Status: Abnormal   Collection Time: 06/09/20  8:05 AM  Result Value Ref Range   Sodium 133 (L) 135 - 145 mmol/L   Potassium 3.5 3.5 - 5.1 mmol/L   Chloride 101 98 - 111 mmol/L   CO2 22 22 - 32 mmol/L   Glucose, Bld 108 (H) 70 - 99 mg/dL   BUN 9 6 - 20 mg/dL   Creatinine, Ser 0.55 0.44 - 1.00 mg/dL   Calcium 9.1 8.9 - 10.3 mg/dL   Total Protein 8.6 (H) 6.5 - 8.1 g/dL   Albumin 4.9 3.5 - 5.0 g/dL   AST 23 15 - 41 U/L   ALT 23 0 - 44 U/L   Alkaline Phosphatase 62 38 - 126 U/L   Total Bilirubin 1.1 0.3 - 1.2 mg/dL   GFR calc non Af Amer >60 >60 mL/min   GFR calc Af Amer >60 >60 mL/min   Anion gap 10 5 - 15  CBC with Differential     Status: Abnormal   Collection Time: 06/09/20  8:05 AM  Result Value Ref Range   WBC 8.5 4.0 - 10.5 K/uL   RBC 3.65 (L) 3.87 - 5.11 MIL/uL   Hemoglobin 11.1 (L) 12.0 - 15.0 g/dL   HCT 31.1 (L) 36 - 46 %   MCV 85.2 80.0 - 100.0 fL   MCH 30.4 26.0 - 34.0 pg   MCHC 35.7 30.0 - 36.0 g/dL   RDW 15.5 11.5 - 15.5 %   Platelets 542 (H) 150 - 400 K/uL   nRBC 0.9 (H) 0.0 - 0.2 %   Neutrophils Relative % 69 %   Neutro Abs 5.9 1.7 - 7.7 K/uL   Lymphocytes Relative 22 %   Lymphs Abs 1.8 0.7 - 4.0 K/uL   Monocytes Relative 7 %   Monocytes Absolute 0.6 0 - 1 K/uL   Eosinophils Relative 1 %   Eosinophils Absolute 0.1 0 - 0 K/uL    Basophils Relative 0 %   Basophils Absolute 0.0 0 - 0 K/uL   Immature Granulocytes 1 %   Abs Immature Granulocytes 0.04 0.00 - 0.07 K/uL  Reticulocytes     Status: Abnormal   Collection Time: 06/09/20  8:05 AM  Result Value Ref Range   Retic Ct Pct 6.6 (H) 0.4 - 3.1 %   RBC. 3.62 (L) 3.87 - 5.11 MIL/uL   Retic Count, Absolute 238.9 (H) 19.0 - 186.0 K/uL   Immature Retic Fract 35.0 (H) 2.3 - 15.9 %  I-Stat beta hCG blood, ED     Status: None   Collection Time: 06/09/20  8:21 AM  Result Value Ref Range   I-stat hCG, quantitative <5.0 <5 mIU/mL   Comment 3            Imaging results:  DG Chest Port 1 View  Result Date: 06/09/2020 CLINICAL DATA:  Chest pain.  History of sickle cell disease EXAM: PORTABLE CHEST 1 VIEW COMPARISON:  April 29, 2020 FINDINGS: Lungs are clear. Heart size and pulmonary vascularity are normal. No adenopathy. No pneumothorax. No bone lesions. IMPRESSION: Lungs clear.  Cardiac silhouette normal. Electronically Signed   By: Lowella Grip III M.D.   On: 06/09/2020 09:41     Assessment & Plan: Patient admitted to sickle cell day infusion center for management of pain crisis.  Patient is opiate toelrant Initiate IV dilaudid  PCA. Settings of 0.5 mg, 10 minute lockout and 3 mg/hr IV fluids, 0.45% saline at 100 ml/hr Toradol 15 mg IV times one dose Tylenol 1000 mg by mouth times one dose Review CBC with differential, complete metabolic panel, and reticulocytes as results become available. Baseline hemoglobin is 9-10 Pain intensity will be reevaluated in context of functioning and relationship to baseline as care progress If pain intensity remains elevated and/or sudden change in hemodynamic stability transition to inpatient services for higher level of care.    [name]  APRN, MSN, FNP-C [facility] [address] [place], [place] [address] [phone]   06/09/2020, 9:15 PM

## 2020-06-09 NOTE — Progress Notes (Signed)
Called by [facility] staff to complete pt's nursing admission history. Pt is talking on phone and eating. She states she needs to eat before doing her admission history. Informed pt's rn and asked her to call me back if she is unable to complete pt's admission hx. Lucius Conn BSN, RN-BC Admissions RN 06/09/2020 7:11 PM

## 2020-06-09 NOTE — ED Notes (Signed)
X-ray at bedside

## 2020-06-09 NOTE — Discharge Instructions (Signed)
Sickle Cell Anemia, Adult  Sickle cell anemia is a condition where your red blood cells are shaped like sickles. Red blood cells carry oxygen through the body. Sickle-shaped cells do not live as long as normal red blood cells. They also clump together and block blood from flowing through the blood vessels. This prevents the body from getting enough oxygen. Sickle cell anemia causes organ damage and pain. It also increases the risk of infection. Follow these instructions at home: Medicines  Take over-the-counter and prescription medicines only as told by your doctor.  If you were prescribed an antibiotic medicine, take it as told by your doctor. Do not stop taking the antibiotic even if you start to feel better.  If you develop a fever, do not take medicines to lower the fever right away. Tell your doctor about the fever. Managing pain, stiffness, and swelling  Try these methods to help with pain: ? Use a heating pad. ? Take a warm bath. ? Distract yourself, such as by watching TV. Eating and drinking  Drink enough fluid to keep your pee (urine) clear or pale yellow. Drink more in hot weather and during exercise.  Limit or avoid alcohol.  Eat a healthy diet. Eat plenty of fruits, vegetables, whole grains, and lean protein.  Take vitamins and supplements as told by your doctor. Traveling  When traveling, keep these with you: ? Your medical information. ? The names of your doctors. ? Your medicines.  If you need to take an airplane, talk to your doctor first. Activity  Rest often.  Avoid exercises that make your heart beat much faster, such as jogging. General instructions  Do not use products that have nicotine or tobacco, such as cigarettes and e-cigarettes. If you need help quitting, ask your doctor.  Consider wearing a medical alert bracelet.  Avoid being in high places (high altitudes), such as mountains.  Avoid very hot or cold temperatures.  Avoid places where the  temperature changes a lot.  Keep all follow-up visits as told by your doctor. This is important. Contact a doctor if:  A joint hurts.  Your feet or hands hurt or swell.  You feel tired (fatigued). Get help right away if:  You have symptoms of infection. These include: ? Fever. ? Chills. ? Being very tired. ? Irritability. ? Poor eating. ? Throwing up (vomiting).  You feel dizzy or faint.  You have new stomach pain, especially on the left side.  You have a an erection (priapism) that lasts more than 4 hours.  You have numbness in your arms or legs.  You have a hard time moving your arms or legs.  You have trouble talking.  You have pain that does not go away when you take medicine.  You are short of breath.  You are breathing fast.  You have a long-term cough.  You have pain in your chest.  You have a bad headache.  You have a stiff neck.  Your stomach looks bloated even though you did not eat much.  Your skin is pale.  You suddenly cannot see well. Summary  Sickle cell anemia is a condition where your red blood cells are shaped like sickles.  Follow your doctor's advice on ways to manage pain, food to eat, activities to do, and steps to take for safe travel.  Get medical help right away if you have any signs of infection, such as a fever. This information is not intended to replace advice given to you by   your health care provider. Make sure you discuss any questions you have with your health care provider. Document Revised: 02/09/2019 Document Reviewed: 11/23/2016 Elsevier Patient Education  2020 [employer] Inc.  

## 2020-06-09 NOTE — ED Notes (Signed)
Pt reports pain is unresolved with pain medications already administered.  Pt requesting more pain medication.  EDP Tomi Bamberger made aware.

## 2020-06-09 NOTE — Progress Notes (Signed)
Patient admitted to the day hospital for treatment of sickle cell pain crisis. Patient reported generalized pain rated 10/10 especially in the back, legs and chest. Chest X-ray done at the ER. Patient placed on Dilaudid PCA and hydrated with IV fluids. Patient transferred to [facility] room 7. Report given to [facility]. Reported pain on transfer was 9 /10. Patient alert, oriented and transported in a wheelchair.

## 2020-06-09 NOTE — ED Provider Notes (Signed)
[facility] DEPT Provider Note   CSN: [id number] Arrival date & time: 06/09/20  0530     History Chief Complaint  Patient presents with  . Sickle Cell Pain Crisis    Carmen [name] is a 29 y.o. female.  HPI   Pt presents to the ED with complaints of sickle cell pain crisis.  Sx are typical for her. She started having aching and joint pain diffusely a few days ago.  She is not sure if the weather change precipitated her sx.  She has tried her home mediations which includes oxycodone and morphine but her sx have not improved.  She has had some nausea but no vomiting.   Mild cp but no shortness of breath.     Past Medical History:  Diagnosis Date  . Headache(784.0)   . Sickle cell crisis ([place])   . Syphilis 2015   Was diagnosed and received one injection of antibiotics  . Thrombocytosis ([place]) 11/22/2014     CBC Latest Ref Rng & Units 12/13/2018 12/11/2018 12/10/2018 WBC 4.0 - 10.5 K/uL 14.8(H) 13.2(H) 15.9(H) Hemoglobin 12.0 - 15.0 g/dL 8.2(L) 7.7(L) 8.1(L) Hematocrit 36.0 - 46.0 % 23.7(L) 23.2(L) 24.5(L) Platelets 150 - 400 K/uL 326 399 388      Patient Active Problem List   Diagnosis Date Noted  . Sickle cell crisis ([place]) 03/31/2020  . Bacterial vaginitis 01/04/2020  . Acute cystitis without hematuria   . Dysuria 12/31/2019  . Urine leukocytes   . Sickle cell pain crisis ([place]) 09/10/2019  . Sickle cell disease ([place]) 12/04/2018  . Alpha thalassemia silent carrier 12/04/2018  . Chronic prescription opiate use 03/13/2018  . Chronic musculoskeletal pain 03/13/2018  . Vitamin D deficiency 01/13/2018  . Leukocytosis 08/21/2017  . 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 Hb-SS disease without crisis ([place]) 08/15/2016  . Anemia of chronic disease   . Chest pain 11/10/2015  . Systolic ejection murmur 41/74/0814  . Sickle cell anemia of mother during pregnancy [facility]) 05/16/2014    Past Surgical History:  Procedure Laterality Date  .  CESAREAN SECTION N/A 02/05/2019   Procedure: CESAREAN SECTION;  Surgeon: [name], MD;  Location: [facility];  Service: Obstetrics;  Laterality: N/A;  . CHOLECYSTECTOMY N/A 11/30/2014   Procedure: LAPAROSCOPIC CHOLECYSTECTOMY SINGLE SITE WITH INTRAOPERATIVE CHOLANGIOGRAM;  Surgeon: Michael Boston, MD;  Location: [facility] ORS;  Service: General;  Laterality: N/A;  . SPLENECTOMY       OB History    Gravida  2   Para  1   Term  1   Preterm      AB  1   Living  1     SAB      TAB  1   Ectopic      Multiple  0   Live Births  1           Family History  Problem Relation Age of Onset  . Hypertension Mother   . Sickle cell anemia Sister   . Kidney disease Sister        Lupus  . Arthritis Sister   . Sickle cell anemia Sister   . Sickle cell trait Sister   . Heart disease Maternal Aunt        CABG  . Heart disease Maternal Uncle        CABG  . Lupus Sister     Social History   Tobacco Use  . Smoking status: Current Some Day  Smoker    Types: Cigarettes  . Smokeless tobacco: Never Used  Vaping Use  . Vaping Use: Never used  Substance Use Topics  . Alcohol use: No  . Drug use: No    Home Medications Prior to Admission medications   Medication Sig Start Date End Date Taking? Authorizing Provider  citalopram (CELEXA) 10 MG tablet Take 1 tablet (10 mg total) by mouth daily. 05/22/20 08/20/20 Yes Vevelyn Francois, NP  folic acid (FOLVITE) 1 MG tablet Take 1 tablet (1 mg total) by mouth daily. 03/15/19  Yes Lanae Boast, FNP  loratadine (CLARITIN) 10 MG tablet Take 1 tablet (10 mg total) by mouth daily. Patient taking differently: Take 10 mg by mouth daily as needed for allergies.  03/28/20  Yes Vevelyn Francois, NP  Oxycodone HCl 10 MG TABS Take 1 tablet (10 mg total) by mouth every 6 (six) hours as needed (PAIN). 05/22/20  Yes Vevelyn Francois, NP  Prenatal Vit-Fe Fumarate-FA (PRENATAL VITAMIN PLUS LOW IRON) 27-1 MG TABS TAKE 1 TABLET BY MOUTH EVERY DAY Patient taking  differently: Take 1 tablet by mouth daily.  03/03/20  Yes Shelly Bombard, MD  Vitamin D, Ergocalciferol, (DRISDOL) 1.25 MG (50000 UT) CAPS capsule TAKE ONE CAPSULE BY MOUTH ONE TIME PER WEEK Patient taking differently: Take 50,000 Units by mouth every 7 (seven) days.  10/11/19  Yes Azzie Glatter, FNP  cyclobenzaprine (FLEXERIL) 10 MG tablet Take 1 tablet (10 mg total) by mouth 2 (two) times daily. Patient not taking: Reported on 06/09/2020 03/28/20 06/26/20  Vevelyn Francois, NP    Allergies    Patient has no known allergies.  Review of Systems   Review of Systems  All other systems reviewed and are negative.   Physical Exam Updated Vital Signs BP 115/79 (BP Location: Right Arm)   Pulse (!) 58   Temp 98.9 F (37.2 C) (Oral)   Resp 13   Ht 1.6 m (5\' 3" )   Wt 74.8 kg   SpO2 99%   BMI 29.23 kg/m   Physical Exam Vitals and nursing note reviewed.  Constitutional:      Appearance: She is well-developed. She is not ill-appearing or diaphoretic.  HENT:     Head: Normocephalic and atraumatic.     Right Ear: External ear normal.     Left Ear: External ear normal.  Eyes:     General: No scleral icterus.       Right eye: No discharge.        Left eye: No discharge.     Conjunctiva/sclera: Conjunctivae normal.  Neck:     Trachea: No tracheal deviation.  Cardiovascular:     Rate and Rhythm: Normal rate and regular rhythm.  Pulmonary:     Effort: Pulmonary effort is normal. No respiratory distress.     Breath sounds: Normal breath sounds. No stridor. No wheezing or rales.  Abdominal:     General: Bowel sounds are normal. There is no distension.     Palpations: Abdomen is soft.     Tenderness: There is no abdominal tenderness. There is no guarding or rebound.  Musculoskeletal:        General: No tenderness.     Cervical back: Neck supple.  Skin:    General: Skin is warm and dry.     Findings: No rash.  Neurological:     Mental Status: She is alert.     Cranial Nerves: No  cranial nerve deficit (no facial droop, extraocular movements  intact, no slurred speech).     Sensory: No sensory deficit.     Motor: No abnormal muscle tone or seizure activity.     Coordination: Coordination normal.     ED Results / Procedures / Treatments   Labs (all labs ordered are listed, but only abnormal results are displayed) Labs Reviewed  COMPREHENSIVE METABOLIC PANEL - Abnormal; Notable for the following components:      Result Value   Sodium 133 (*)    Glucose, Bld 108 (*)    Total Protein 8.6 (*)    All other components within normal limits  CBC WITH DIFFERENTIAL/PLATELET - Abnormal; Notable for the following components:   RBC 3.65 (*)    Hemoglobin 11.1 (*)    HCT 31.1 (*)    Platelets 542 (*)    nRBC 0.9 (*)    All other components within normal limits  RETICULOCYTES - Abnormal; Notable for the following components:   Retic Ct Pct 6.6 (*)    RBC. 3.62 (*)    Retic Count, Absolute 238.9 (*)    Immature Retic Fract 35.0 (*)    All other components within normal limits  I-STAT BETA HCG BLOOD, ED ([facility], [facility], [facility] ONLY)  I-STAT BETA HCG BLOOD, ED ([facility], [facility], [facility] ONLY)    EKG None  Radiology DG Chest Port 1 View  Result Date: 06/09/2020 CLINICAL DATA:  Chest pain.  History of sickle cell disease EXAM: PORTABLE CHEST 1 VIEW COMPARISON:  April 29, 2020 FINDINGS: Lungs are clear. Heart size and pulmonary vascularity are normal. No adenopathy. No pneumothorax. No bone lesions. IMPRESSION: Lungs clear.  Cardiac silhouette normal. Electronically Signed   By: Lowella Grip III M.D.   On: 06/09/2020 09:41    Procedures .Critical Care Performed by: Dorie Rank, MD Authorized by: Dorie Rank, MD   Critical care provider statement:    Critical care time (minutes):  45   Critical care was time spent personally by me on the following activities:  Discussions with consultants, evaluation of patient's response to treatment, examination of patient, ordering and performing  treatments and interventions, ordering and review of laboratory studies, ordering and review of radiographic studies, pulse oximetry, re-evaluation of patient's condition, obtaining history from patient or surrogate and review of old charts   (including critical care time)  Medications Ordered in ED Medications  0.45 % sodium chloride infusion ( Intravenous New Bag/Given 06/09/20 0843)  ondansetron (ZOFRAN) injection 4 mg (4 mg Intravenous Given 06/09/20 0841)  sodium chloride flush (NS) 0.9 % injection 3 mL (3 mLs Intravenous Given 06/09/20 0837)  ketorolac (TORADOL) 15 MG/ML injection 15 mg (15 mg Intravenous Given 06/09/20 0823)  HYDROmorphone (DILAUDID) injection 2 mg (2 mg Intravenous Given 06/09/20 0824)  HYDROmorphone (DILAUDID) injection 2 mg (2 mg Intravenous Given 06/09/20 0915)  diphenhydrAMINE (BENADRYL) injection 25 mg (25 mg Intravenous Given 06/09/20 0823)  HYDROmorphone (DILAUDID) injection 2 mg (2 mg Intravenous Given 06/09/20 1100)    ED Course  I have reviewed the triage vital signs and the nursing notes.  Pertinent labs & imaging results that were available during my care of the patient were reviewed by me and considered in my medical decision making (see chart for details).  Clinical Course as of Jun 09 1152  Mon Jun 09, 2020  1050 Labs reviewed.  Anemia stable.  Reticulocyte count elevated.   [HY]  0737 Chest x-ray without acute findings or infiltrate to suggest acute stress syndrome   [JK]  1050 Patient states she  still having pain.  Requests additional medications   [JK]  1127 D/w [place] Hollis.  Will check on availability of sickle cell clinic    [JK]    Clinical Course User Index [JK] Dorie Rank, MD   MDM Rules/Calculators/A&P                          Patient presented to the ED for evaluation of sickle cell pain crisis.  Was treated per our sickle cell algorithm.  No signs of acute infection.  No signs of acute chest syndrome.  No signs of acute anemia.  Patient  continues have persistent pain.  I discussed with Ms. Hollis from the sickle cell center.  Patient will be transferred down to the sickle cell center to continue her treatment. Final Clinical Impression(s) / ED Diagnoses Final diagnoses:  Sickle cell pain crisis [facility])      Dorie Rank, MD 06/09/20 1154

## 2020-06-09 NOTE — ED Notes (Addendum)
Pt discharged to sickle cell center.  Pt wheeled to [place] via wheelchair.  Pt in NAD. PIV remained intact.

## 2020-06-10 ENCOUNTER — Encounter (HOSPITAL_COMMUNITY): Payer: Self-pay | Admitting: Internal Medicine

## 2020-06-10 DIAGNOSIS — Z8249 Family history of ischemic heart disease and other diseases of the circulatory system: Secondary | ICD-10-CM | POA: Diagnosis not present

## 2020-06-10 DIAGNOSIS — D638 Anemia in other chronic diseases classified elsewhere: Secondary | ICD-10-CM | POA: Diagnosis present

## 2020-06-10 DIAGNOSIS — Z79891 Long term (current) use of opiate analgesic: Secondary | ICD-10-CM | POA: Diagnosis not present

## 2020-06-10 DIAGNOSIS — Z20822 Contact with and (suspected) exposure to covid-19: Secondary | ICD-10-CM | POA: Diagnosis present

## 2020-06-10 DIAGNOSIS — F17200 Nicotine dependence, unspecified, uncomplicated: Secondary | ICD-10-CM | POA: Diagnosis present

## 2020-06-10 DIAGNOSIS — Z8261 Family history of arthritis: Secondary | ICD-10-CM | POA: Diagnosis not present

## 2020-06-10 DIAGNOSIS — D5700 Hb-SS disease with crisis, unspecified: Secondary | ICD-10-CM | POA: Diagnosis present

## 2020-06-10 DIAGNOSIS — M7918 Myalgia, other site: Secondary | ICD-10-CM

## 2020-06-10 DIAGNOSIS — G8929 Other chronic pain: Secondary | ICD-10-CM | POA: Diagnosis not present

## 2020-06-10 DIAGNOSIS — Z832 Family history of diseases of the blood and blood-forming organs and certain disorders involving the immune mechanism: Secondary | ICD-10-CM | POA: Diagnosis not present

## 2020-06-10 DIAGNOSIS — G894 Chronic pain syndrome: Secondary | ICD-10-CM | POA: Diagnosis present

## 2020-06-10 DIAGNOSIS — Z841 Family history of disorders of kidney and ureter: Secondary | ICD-10-CM | POA: Diagnosis not present

## 2020-06-10 LAB — BASIC METABOLIC PANEL
Anion gap: 10 (ref 5–15)
BUN: 7 mg/dL (ref 6–20)
CO2: 24 mmol/L (ref 22–32)
Calcium: 8.7 mg/dL — ABNORMAL LOW (ref 8.9–10.3)
Chloride: 103 mmol/L (ref 98–111)
Creatinine, Ser: 0.53 mg/dL (ref 0.44–1.00)
GFR calc Af Amer: >60 mL/min (ref >60)
GFR calc non Af Amer: >60 mL/min (ref >60)
Glucose, Bld: 102 mg/dL — ABNORMAL HIGH (ref 70–99)
Potassium: 3.6 mmol/L (ref 3.5–5.1)
Sodium: 137 mmol/L (ref 135–145)

## 2020-06-10 LAB — CBC
HCT: 25.8 % — ABNORMAL LOW (ref 36.0–46.0)
Hemoglobin: 9.2 g/dL — ABNORMAL LOW (ref 12.0–15.0)
MCH: 30.9 pg (ref 26.0–34.0)
MCHC: 35.7 g/dL (ref 30.0–36.0)
MCV: 86.6 fL (ref 80.0–100.0)
Platelets: 443 10*3/uL — ABNORMAL HIGH (ref 150–400)
RBC: 2.98 MIL/uL — ABNORMAL LOW (ref 3.87–5.11)
RDW: 14.9 % (ref 11.5–15.5)
WBC: 9 10*3/uL (ref 4.0–10.5)
nRBC: 0.4 % — ABNORMAL HIGH (ref 0.0–0.2)

## 2020-06-10 LAB — SARS CORONAVIRUS 2 (TAT 6-24 HRS): SARS Coronavirus 2: NEGATIVE

## 2020-06-10 MED ORDER — HYDROMORPHONE 1 MG/ML IV SOLN
INTRAVENOUS | Status: DC
  Administered 2020-06-10: 4 mg via INTRAVENOUS
  Administered 2020-06-10: 10 mg via INTRAVENOUS
  Administered 2020-06-10: 5.5 mg via INTRAVENOUS
  Administered 2020-06-10: 30 mg via INTRAVENOUS
  Administered 2020-06-10: 4.5 mg via INTRAVENOUS
  Administered 2020-06-11: 5.6 mg via INTRAVENOUS
  Administered 2020-06-11: 4.5 mg via INTRAVENOUS
  Filled 2020-06-10: qty 30

## 2020-06-10 MED ORDER — OXYCODONE HCL 5 MG PO TABS
10.0000 mg | ORAL_TABLET | ORAL | Status: DC | PRN
  Administered 2020-06-10 – 2020-06-11 (×4): 10 mg via ORAL
  Filled 2020-06-10 (×5): qty 2

## 2020-06-10 MED ORDER — MORPHINE SULFATE ER 30 MG PO TBCR
60.0000 mg | EXTENDED_RELEASE_TABLET | Freq: Two times a day (BID) | ORAL | Status: DC
  Administered 2020-06-10 – 2020-06-11 (×3): 60 mg via ORAL
  Filled 2020-06-10 (×3): qty 2

## 2020-06-10 NOTE — Progress Notes (Signed)
Subjective: Carmen [name] is a 29 year old female with a medical history significant for sickle cell disease, chronic pain syndrome, opiate dependence and tolerance, and history of anemia of chronic disease was admitted for sickle cell pain crisis.  Patient continues to complain of increased pain primarily to low back and lower extremities.  Pain intensity is 8/10 despite IV Dilaudid PCA.  Patient denies headache, chest pain, urinary symptoms, nausea, vomiting, or diarrhea.  Objective:  Vital signs in last 24 hours:  Vitals:   06/10/20 0110 06/10/20 0512 06/10/20 0520 06/10/20 0813  BP: 115/80  120/74   Pulse: (!) 55  (!) 54   Resp: 17 16 16 14   Temp: 98 F (36.7 C)  98.2 F (36.8 C)   TempSrc: Oral  Oral   SpO2: 98% 98% 97% 96%    Intake/Output from previous day:   Intake/Output Summary (Last 24 hours) at 06/10/2020 0904 Last data filed at 06/10/2020 0600 Gross per 24 hour  Intake 1971.49 ml  Output --  Net 1971.49 ml    Physical Exam: General: Alert, awake, oriented x3, in no acute distress.  HEENT: [place]/AT PEER[place], EOMI Neck: Trachea midline,  no masses, no thyromegal,y no JVD, no carotid bruit OROPHARYNX:  Moist, No exudate/ erythema/lesions.  Heart: Regular rate and rhythm, without murmurs, rubs, gallops, PMI non-displaced, no heaves or thrills on palpation.  Lungs: Clear to auscultation, no wheezing or rhonchi noted. No increased vocal fremitus resonant to percussion  Abdomen: Soft, nontender, nondistended, positive bowel sounds, no masses no hepatosplenomegaly noted..  Neuro: No focal neurological deficits noted cranial nerves II through XII grossly intact. DTRs 2+ bilaterally upper and lower extremities. Strength 5 out of 5 in bilateral upper and lower extremities. Musculoskeletal: No warm swelling or erythema around joints, no spinal tenderness noted. Psychiatric: Patient alert and oriented x3, good insight and cognition, good recent to remote recall. Lymph node survey:  No cervical axillary or inguinal lymphadenopathy noted.  Lab Results:  Basic Metabolic Panel:    Component Value Date/Time   NA 137 06/10/2020 0621   NA 135 03/28/2020 1542   K 3.6 06/10/2020 0621   CL 103 06/10/2020 0621   CO2 24 06/10/2020 0621   BUN 7 06/10/2020 0621   BUN 9 03/28/2020 1542   CREATININE 0.53 06/10/2020 0621   GLUCOSE 102 (H) 06/10/2020 0621   CALCIUM 8.7 (L) 06/10/2020 0621   CBC:    Component Value Date/Time   WBC 9.0 06/10/2020 0621   HGB 9.2 (L) 06/10/2020 0621   HGB 9.8 (L) 03/28/2020 1542   HCT 25.8 (L) 06/10/2020 0621   HCT 29.8 (L) 03/28/2020 1542   PLT 443 (H) 06/10/2020 0621   PLT 516 (H) 03/28/2020 1542   MCV 86.6 06/10/2020 0621   MCV 89 03/28/2020 1542   NEUTROABS 5.9 06/09/2020 0805   NEUTROABS 5.6 03/28/2020 1542   LYMPHSABS 1.8 06/09/2020 0805   LYMPHSABS 4.6 (H) 03/28/2020 1542   MONOABS 0.6 06/09/2020 0805   EOSABS 0.1 06/09/2020 0805   EOSABS 0.2 03/28/2020 1542   BASOSABS 0.0 06/09/2020 0805   BASOSABS 0.1 03/28/2020 1542    Recent Results (from the past 240 hour(s))  SARS CORONAVIRUS 2 (TAT 6-24 HRS) Nasopharyngeal Nasopharyngeal Swab     Status: None   Collection Time: 06/09/20  5:34 PM   Specimen: Nasopharyngeal Swab  Result Value Ref Range Status   SARS Coronavirus 2 NEGATIVE NEGATIVE Final    Comment: (NOTE) SARS-CoV-2 target nucleic acids are NOT DETECTED.  The SARS-CoV-2 RNA  is generally detectable in upper and lower respiratory specimens during the acute phase of infection. Negative results do not preclude SARS-CoV-2 infection, do not rule out co-infections with other pathogens, and should not be used as the sole basis for treatment or other patient management decisions. Negative results must be combined with clinical observations, patient history, and epidemiological information. The expected result is Negative.  Fact Sheet for Patients: SugarRoll.be  Fact Sheet for Healthcare  Providers: https://www.woods-mathews.com/  This test is not yet approved or cleared by the [place] FDA and  has been authorized for detection and/or diagnosis of SARS-CoV-2 by FDA under an Emergency Use Authorization (EUA). This EUA will remain  in effect (meaning this test can be used) for the duration of the COVID-19 declaration under Se ction 564(b)(1) of the Act, 21 U.S.C. section 360bbb-3(b)(1), unless the authorization is terminated or revoked sooner.  Performed at [facility], [place] [address] [place], [place] [address]     Studies/Results: DG Chest Port 1 View  Result Date: 06/09/2020 CLINICAL DATA:  Chest pain.  History of sickle cell disease EXAM: PORTABLE CHEST 1 VIEW COMPARISON:  April 29, 2020 FINDINGS: Lungs are clear. Heart size and pulmonary vascularity are normal. No adenopathy. No pneumothorax. No bone lesions. IMPRESSION: Lungs clear.  Cardiac silhouette normal. Electronically Signed   By: Lowella Grip III M.D.   On: 06/09/2020 09:41    Medications: Scheduled Meds: . citalopram  10 mg Oral Daily  . enoxaparin (LOVENOX) injection  40 mg Subcutaneous Q24H  . folic acid  1 mg Oral Daily  . HYDROmorphone   Intravenous Q4H  . ketorolac  15 mg Intravenous Q6H  . ketorolac  15 mg Intravenous Once  . morphine  60 mg Oral Q12H  . senna-docusate  1 tablet Oral BID   Continuous Infusions: . sodium chloride 50 mL/hr at 06/10/20 0713   PRN Meds:.diphenhydrAMINE, loratadine, naloxone **AND** sodium chloride flush, ondansetron (ZOFRAN) IV, oxyCODONE, polyethylene glycol  Consultants:  None  Procedures:  None  Antibiotics:  None  Assessment/Plan: Principal Problem:   Sickle cell pain crisis (HCC) Active Problems:   Chronic prescription opiate use   Chronic musculoskeletal pain  Sickle cell disease with pain crisis: Continue IV fluid, 0.45% saline at [facility] IV Dilaudid PCA, settings decreased to 0.5 mg, 10-minute lockout, and 2  mg/h. Restart oxycodone 15 mg every 4 hours as needed for severe breakthrough pain Monitor vital signs closely, reevaluate pain scale regularly, and supplemental oxygen as needed.  Chronic pain syndrome: Continue home medications  Sickle cell anemia: Hemoglobin stable and consistent with patient's baseline.  Continue to follow closely.    Code Status: Full Code Family Communication: N/A Disposition Plan: Not yet ready for discharge  [place], MSN, FNP-C Patient [place] [address] [place] [address], [place] [address] [phone]  If 5PM-7AM, please contact night-coverage.  06/10/2020, 9:04 AM  LOS: 0 days

## 2020-06-10 NOTE — Telephone Encounter (Signed)
Done

## 2020-06-11 NOTE — Discharge Summary (Signed)
Physician Discharge Summary  Carmen [name]:[id number] DOB: Aug 20, 1991 DOA: 06/09/2020  PCP: Vevelyn Francois, NP  Admit date: 06/09/2020  Discharge date: 06/11/2020  Discharge Diagnoses:  Principal Problem:   Sickle cell pain crisis [facility]) Active Problems:   Chronic prescription opiate use   Chronic musculoskeletal pain   Discharge Condition: Stable  Disposition:   Follow-up Information    Vevelyn Francois, NP Follow up in 1 week(s).   Specialty: Adult Health Nurse Practitioner Contact information: [address] [name] [address] [phone]              Pt is discharged home in good condition and is to follow up with Vevelyn Francois, NP this week to have labs evaluated. Carmen [name] is instructed to increase activity slowly and balance with rest for the next few days, and use prescribed medication to complete treatment of pain  Diet: Regular Wt Readings from Last 3 Encounters:  06/09/20 74.8 kg  05/11/20 80.5 kg  05/06/20 84.2 kg    History of present illness:  Carmen [name]  is a 29 y.o. female with a medical history significant for sickle cell disease, chronic pain syndrome, opiate dependence and tolerance, and history of anemia of chronic disease presented to sickle cell day infusion center with complaints of generalized pain that is consistent with her typical pain crisis.  Patient was treated and evaluated in the emergency room this a.m. without complete resolution of this problem.  Patient transition to sickle cell day infusion center for pain management and extended observation.  Pain intensity 9/10 characterized as constant and throbbing.  Patient says that her pain has been unrelieved by home medications over the past several days.  She attributes pain crisis to increased stressors at home.  She denies any headache, chest pain, urinary symptoms, nausea, vomiting, or diarrhea.  No sick contacts, recent travel, or exposure to COVID-19.  Sickle cell day  infusion center course: Vital signs recorded as BP 112/73 (BP Location: Left Arm)   Pulse 63   Temp 98.3 F (36.8 C) (Oral)   Resp 12   SpO2 99% .  Labs are largely within normal limits.  Pain persists despite IV Dilaudid PCA, IV fluids, and IV Toradol.  Admit to [place] and observation for further management of sickle cell pain crisis.  Hospital Course:  Sickle cell disease with pain crisis: Patient was admitted for sickle cell pain crisis and managed appropriately with IVF, IV Dilaudid via PCA and IV Toradol, as well as other adjunct therapies per sickle cell pain management protocols.  IV Dilaudid PCA was weaned appropriately.  MS Contin continued throughout admission without interruption.  Patient was transitioned to oxycodone 10 mg every 6 hours.  She will resume home opiate medication regimen.  Patient has follow-up with PCP in 1 week and will repeat CBC with differential at that time.  Also, medication management appointment.    Patient is alert, oriented, and ambulating without assistance.  She is aware of all upcoming appointments.  Patient was discharged home today in a hemodynamically stable condition.   Discharge Exam: Vitals:   06/11/20 1003 06/11/20 1007  BP: 116/82   Pulse: 65   Resp: 15 15  Temp: 98.7 F (37.1 C)   SpO2: 96% 99%   Vitals:   06/11/20 0350 06/11/20 0520 06/11/20 1003 06/11/20 1007  BP:  130/65 116/82   Pulse:  65 65   Resp: 14 15 15 15   Temp:  97.8 F (36.6  C) 98.7 F (37.1 C)   TempSrc:  Oral Oral   SpO2: 98% 99% 96% 99%    General appearance : Awake, alert, not in any distress. Speech Clear. Not toxic looking HEENT: Atraumatic and Normocephalic, pupils equally reactive to light and accomodation Neck: Supple, no JVD. No cervical lymphadenopathy.  Chest: Good air entry bilaterally, no added sounds  CVS: S1 S2 regular, no murmurs.  Abdomen: Bowel sounds present, Non tender and not distended with no gaurding, rigidity or rebound. Extremities:  B/L Lower Ext shows no edema, both legs are warm to touch Neurology: Awake alert, and oriented X 3, CN II-XII intact, Non focal Skin: No Rash  Discharge Instructions  Discharge Instructions    Discharge patient   Complete by: As directed    Discharge disposition: 01-Home or Self Care   Discharge patient date: 06/11/2020     Allergies as of 06/11/2020   No Known Allergies     Medication List    TAKE these medications   citalopram 10 MG tablet Commonly known as: CeleXA Take 1 tablet (10 mg total) by mouth daily.   cyclobenzaprine 10 MG tablet Commonly known as: FLEXERIL Take 1 tablet (10 mg total) by mouth 2 (two) times daily.   folic acid 1 MG tablet Commonly known as: FOLVITE Take 1 tablet (1 mg total) by mouth daily.   loratadine 10 MG tablet Commonly known as: CLARITIN Take 1 tablet (10 mg total) by mouth daily. What changed:   when to take this  reasons to take this   morphine 60 MG 12 hr tablet Commonly known as: MS Contin Take 1 tablet (60 mg total) by mouth every 12 (twelve) hours.   Oxycodone HCl 10 MG Tabs Take 1 tablet (10 mg total) by mouth every 6 (six) hours as needed (PAIN).   Prenatal Vitamin Plus Low Iron 27-1 MG Tabs TAKE 1 TABLET BY MOUTH EVERY DAY   Vitamin D (Ergocalciferol) 1.25 MG (50000 UNIT) Caps capsule Commonly known as: DRISDOL TAKE ONE CAPSULE BY MOUTH ONE TIME PER WEEK What changed: See the new instructions.       The results of significant diagnostics from this hospitalization (including imaging, microbiology, ancillary and laboratory) are listed below for reference.    Significant Diagnostic Studies: DG Chest Port 1 View  Result Date: 06/09/2020 CLINICAL DATA:  Chest pain.  History of sickle cell disease EXAM: PORTABLE CHEST 1 VIEW COMPARISON:  April 29, 2020 FINDINGS: Lungs are clear. Heart size and pulmonary vascularity are normal. No adenopathy. No pneumothorax. No bone lesions. IMPRESSION: Lungs clear.  Cardiac silhouette  normal. Electronically Signed   By: Lowella Grip III M.D.   On: 06/09/2020 09:41    Microbiology: Recent Results (from the past 240 hour(s))  SARS CORONAVIRUS 2 (TAT 6-24 HRS) Nasopharyngeal Nasopharyngeal Swab     Status: None   Collection Time: 06/09/20  5:34 PM   Specimen: Nasopharyngeal Swab  Result Value Ref Range Status   SARS Coronavirus 2 NEGATIVE NEGATIVE Final    Comment: (NOTE) SARS-CoV-2 target nucleic acids are NOT DETECTED.  The SARS-CoV-2 RNA is generally detectable in upper and lower respiratory specimens during the acute phase of infection. Negative results do not preclude SARS-CoV-2 infection, do not rule out co-infections with other pathogens, and should not be used as the sole basis for treatment or other patient management decisions. Negative results must be combined with clinical observations, patient history, and epidemiological information. The expected result is Negative.  Fact Sheet for Patients: SugarRoll.be  Fact Sheet for Healthcare Providers: https://www.woods-mathews.com/  This test is not yet approved or cleared by the [place] FDA and  has been authorized for detection and/or diagnosis of SARS-CoV-2 by FDA under an Emergency Use Authorization (EUA). This EUA will remain  in effect (meaning this test can be used) for the duration of the COVID-19 declaration under Se ction 564(b)(1) of the Act, 21 U.S.C. section 360bbb-3(b)(1), unless the authorization is terminated or revoked sooner.  Performed at [facility], [place] [address], [place], [place] [address]      Labs: Basic Metabolic Panel: Recent Labs  Lab 06/09/20 0805 06/10/20 0621  NA 133* 137  K 3.5 3.6  CL 101 103  CO2 22 24  GLUCOSE 108* 102*  BUN 9 7  CREATININE 0.55 0.53  CALCIUM 9.1 8.7*   Liver Function Tests: Recent Labs  Lab 06/09/20 0805  AST 23  ALT 23  ALKPHOS 62  BILITOT 1.1  PROT 8.6*  ALBUMIN 4.9    No results for input(s): LIPASE, AMYLASE in the last 168 hours. No results for input(s): AMMONIA in the last 168 hours. CBC: Recent Labs  Lab 06/09/20 0805 06/10/20 0621  WBC 8.5 9.0  NEUTROABS 5.9  --   HGB 11.1* 9.2*  HCT 31.1* 25.8*  MCV 85.2 86.6  PLT 542* 443*   Cardiac Enzymes: No results for input(s): CKTOTAL, CKMB, CKMBINDEX, TROPONINI in the last 168 hours. BNP: Invalid input(s): POCBNP CBG: No results for input(s): GLUCAP in the last 168 hours.  Time coordinating discharge: 35 minutes  Signed:   [name]  APRN, MSN, FNP-C Patient [facility] [address] [place], [place] [address] [phone]   [facility] 06/11/2020, 7:58 PM

## 2020-06-11 NOTE — Progress Notes (Signed)
Pt discharged home in stable condition. Discharge instructions given. Script sent to pharmacy of choice. No immediate questions or concerns at this time. Pt opted to ambulate off the department.

## 2020-06-17 ENCOUNTER — Other Ambulatory Visit: Payer: Self-pay | Admitting: Nurse Practitioner

## 2020-06-17 ENCOUNTER — Telehealth: Payer: Self-pay | Admitting: Nurse Practitioner

## 2020-06-17 DIAGNOSIS — Z79891 Long term (current) use of opiate analgesic: Secondary | ICD-10-CM

## 2020-06-17 DIAGNOSIS — D571 Sickle-cell disease without crisis: Secondary | ICD-10-CM

## 2020-06-17 DIAGNOSIS — G894 Chronic pain syndrome: Secondary | ICD-10-CM

## 2020-06-17 MED ORDER — OXYCODONE HCL 10 MG PO TABS: 10.0000 mg | ORAL_TABLET | Freq: Four times a day (QID) | ORAL | 0 refills | Status: DC | PRN

## 2020-06-17 NOTE — Telephone Encounter (Signed)
Sent!

## 2020-06-17 NOTE — Progress Notes (Signed)
   [place], [place]  [address] Phone:  [phone]   Fax:  [phone] PDMP reviewed during this encounter.Subjective:    [name] calls for refill of her oxycodone 10 mg.    Objective:   Indication for chronic opioid: Chronic pain syndrome related to sickle cell Medication and dose: Oxycodone 10 mg every 6 hours as needed # pills per month: 120 Last UDS date: 03/28/2020 Opioid Treatment Agreement signed (Y/N): Y Opioid Treatment Agreement last reviewed with patient:  2021 [place] reviewed this encounter (include red flags): Yes   Assessment:   Chronic Pain Syndrome related to Sickle Cell Disease    Plan:  Oxycodone 10 mg #60 refilled   Return for follow-up appointment as scheduled.

## 2020-06-27 ENCOUNTER — Telehealth: Payer: Self-pay | Admitting: Nurse Practitioner

## 2020-06-30 ENCOUNTER — Ambulatory Visit: Payer: Self-pay | Admitting: Nurse Practitioner

## 2020-06-30 ENCOUNTER — Other Ambulatory Visit: Payer: Self-pay | Admitting: Nurse Practitioner

## 2020-06-30 ENCOUNTER — Other Ambulatory Visit: Payer: Self-pay | Admitting: Family Medicine

## 2020-06-30 DIAGNOSIS — D571 Sickle-cell disease without crisis: Secondary | ICD-10-CM

## 2020-06-30 DIAGNOSIS — Z79891 Long term (current) use of opiate analgesic: Secondary | ICD-10-CM

## 2020-06-30 DIAGNOSIS — G894 Chronic pain syndrome: Secondary | ICD-10-CM

## 2020-06-30 MED ORDER — OXYCODONE HCL 10 MG PO TABS: 10.0000 mg | ORAL_TABLET | Freq: Four times a day (QID) | ORAL | 0 refills | Status: DC | PRN

## 2020-06-30 NOTE — Telephone Encounter (Signed)
Refill date 07/02/2020 prescription sent

## 2020-06-30 NOTE — Progress Notes (Signed)
° °  [place],   [address] Phone:  [phone]   Fax:  [phone] PDMP not reviewed this encounter.Subjective:    Carmen [name] calls for refill of her medication refill.    Objective:   Indication for chronic opioid: Chronic pain related to sickle cell disease Medication and dose: Oxycodone 10 mg # pills per month: #120 Last UDS date: 5 06/2020 Opioid Treatment Agreement signed (Y/N): Yes Opioid Treatment Agreement last reviewed with patient:  2021 [place] reviewed this encounter (include red flags):  Reviewed no red flags   Assessment:   Chronic Pain Syndrome related to Sickle Cell Disease    Plan:    Return for follow-up appointment as scheduled.

## 2020-07-02 ENCOUNTER — Encounter: Payer: Self-pay | Admitting: Nurse Practitioner

## 2020-07-02 ENCOUNTER — Ambulatory Visit (INDEPENDENT_AMBULATORY_CARE_PROVIDER_SITE_OTHER): Payer: Medicaid Other | Admitting: Nurse Practitioner

## 2020-07-02 ENCOUNTER — Other Ambulatory Visit: Payer: Self-pay

## 2020-07-02 VITALS — BP 116/79 | HR 76 | Temp 97.9°F | Ht 63.0 in | Wt 189.0 lb

## 2020-07-02 DIAGNOSIS — K047 Periapical abscess without sinus: Secondary | ICD-10-CM

## 2020-07-02 DIAGNOSIS — F3341 Major depressive disorder, recurrent, in partial remission: Secondary | ICD-10-CM

## 2020-07-02 DIAGNOSIS — Z1159 Encounter for screening for other viral diseases: Secondary | ICD-10-CM

## 2020-07-02 DIAGNOSIS — D571 Sickle-cell disease without crisis: Secondary | ICD-10-CM | POA: Diagnosis not present

## 2020-07-02 DIAGNOSIS — Z79891 Long term (current) use of opiate analgesic: Secondary | ICD-10-CM | POA: Diagnosis not present

## 2020-07-02 DIAGNOSIS — Z9189 Other specified personal risk factors, not elsewhere classified: Secondary | ICD-10-CM

## 2020-07-02 LAB — POCT URINALYSIS DIPSTICK (MANUAL)
Leukocytes, UA: NEGATIVE
Nitrite, UA: NEGATIVE
Poct Bilirubin: NEGATIVE
Poct Glucose: NORMAL mg/dL
Poct Ketones: NEGATIVE
Poct Protein: NEGATIVE mg/dL
Poct Urobilinogen: NORMAL mg/dL
Spec Grav, UA: 1.025 (ref 1.010–1.025)
pH, UA: 6 (ref 5.0–8.0)

## 2020-07-02 MED ORDER — AMOXICILLIN-POT CLAVULANATE 875-125 MG PO TABS
1.0000 | ORAL_TABLET | Freq: Two times a day (BID) | ORAL | 0 refills | Status: DC
Start: 2020-07-02 — End: 2020-07-14
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
## 2020-07-02 NOTE — Progress Notes (Signed)
Integrated Behavioral Health General Follow Up Note  07/02/2020 Name: Carmen [name] MRN: [id number] DOB: 23-Mar-1991 Carmen [name] is a 29 y.o. year old female who sees Vevelyn Francois, NP for primary care. LCSW was initially consulted to assess mental health needs and assist the patient with Mental Health Counseling and Resources.  Interpreter: No.   Interpreter Name & Language: none  Ongoing Intervention: Patient experiencing depression. Today CSW met with patient during PCP appointment. Patient's IBH appointment 05/12/20 was cancelled due to patient hospitalization. Patient expressed earnest interest in starting IBH sessions to address depression. Scheduled follow up for 07/10/20.  Enrolled patient in [facility] transportation services, as patient expressed need for assistance in getting to appointments.  Review of patient status, including review of consultants reports, relevant laboratory and other test results, and collaboration with appropriate care team members and the patient's provider was performed as part of comprehensive patient evaluation and provision of services.     Follow up Plan: 1. IBH appointment 07/10/20   Estanislado Emms, [facility] [phone]

## 2020-07-03 ENCOUNTER — Encounter: Payer: Self-pay | Admitting: Nurse Practitioner

## 2020-07-03 LAB — CMP14+CBC/D/PLT+FER+RETIC+V...
ALT: 19 IU/L (ref 0–32)
AST: 25 IU/L (ref 0–40)
Albumin/Globulin Ratio: 1.4 (ref 1.2–2.2)
Albumin: 4.3 g/dL (ref 3.9–5.0)
Alkaline Phosphatase: 82 IU/L (ref 48–121)
BUN/Creatinine Ratio: 14 (ref 9–23)
BUN: 8 mg/dL (ref 6–20)
Basophils Absolute: 0.1 10*3/uL (ref 0.0–0.2)
Basos: 1 %
Bilirubin Total: 0.7 mg/dL (ref 0.0–1.2)
CO2: 24 mmol/L (ref 20–29)
Calcium: 9.4 mg/dL (ref 8.7–10.2)
Chloride: 106 mmol/L (ref 96–106)
Creatinine, Ser: 0.58 mg/dL (ref 0.57–1.00)
EOS (ABSOLUTE): 0.2 10*3/uL (ref 0.0–0.4)
Eos: 3 %
Ferritin: 243 ng/mL — ABNORMAL HIGH (ref 15–150)
GFR calc Af Amer: 146 mL/min/{1.73_m2} (ref >59)
GFR calc non Af Amer: 127 mL/min/{1.73_m2} (ref >59)
Globulin, Total: 3.1 g/dL (ref 1.5–4.5)
Glucose: 83 mg/dL (ref 65–99)
Hematocrit: 28.9 % — ABNORMAL LOW (ref 34.0–46.6)
Hemoglobin: 9.8 g/dL — ABNORMAL LOW (ref 11.1–15.9)
Immature Grans (Abs): 0 10*3/uL (ref 0.0–0.1)
Immature Granulocytes: 0 %
Lymphocytes Absolute: 3.5 10*3/uL — ABNORMAL HIGH (ref 0.7–3.1)
Lymphs: 47 %
MCH: 30 pg (ref 26.6–33.0)
MCHC: 33.9 g/dL (ref 31.5–35.7)
MCV: 88 fL (ref 79–97)
Monocytes Absolute: 0.7 10*3/uL (ref 0.1–0.9)
Monocytes: 9 %
Neutrophils Absolute: 3 10*3/uL (ref 1.4–7.0)
Neutrophils: 40 %
Platelets: 628 10*3/uL — ABNORMAL HIGH (ref 150–450)
Potassium: 4.2 mmol/L (ref 3.5–5.2)
RBC: 3.27 x10E6/uL — ABNORMAL LOW (ref 3.77–5.28)
RDW: 14.6 % (ref 11.7–15.4)
Retic Ct Pct: 5 % — ABNORMAL HIGH (ref 0.6–2.6)
Sodium: 142 mmol/L (ref 134–144)
Total Protein: 7.4 g/dL (ref 6.0–8.5)
Vit D, 25-Hydroxy: 19.3 ng/mL — ABNORMAL LOW (ref 30.0–100.0)
WBC: 7.5 10*3/uL (ref 3.4–10.8)

## 2020-07-03 LAB — HEPATITIS C ANTIBODY: Hep C Virus Ab: <0.1 s/co ratio (ref 0.0–0.9)

## 2020-07-08 ENCOUNTER — Other Ambulatory Visit: Payer: Self-pay | Admitting: Family Medicine

## 2020-07-08 DIAGNOSIS — D571 Sickle-cell disease without crisis: Secondary | ICD-10-CM

## 2020-07-08 DIAGNOSIS — Z79891 Long term (current) use of opiate analgesic: Secondary | ICD-10-CM

## 2020-07-09 ENCOUNTER — Other Ambulatory Visit: Payer: Self-pay | Admitting: Family Medicine

## 2020-07-09 DIAGNOSIS — D571 Sickle-cell disease without crisis: Secondary | ICD-10-CM

## 2020-07-09 DIAGNOSIS — Z79891 Long term (current) use of opiate analgesic: Secondary | ICD-10-CM

## 2020-07-09 LAB — CANNABINOID CONFIRMATION, UR
CANNABINOIDS: POSITIVE — AB
Carboxy THC GC/MS Conf: 583 ng/mL

## 2020-07-09 LAB — DRUG SCREEN 764883 11+OXYCO+ALC+CRT-BUND
Amphetamines, Urine: NEGATIVE ng/mL
BENZODIAZ UR QL: NEGATIVE ng/mL
Barbiturate: NEGATIVE ng/mL
Cocaine (Metabolite): NEGATIVE ng/mL
Creatinine: 131.6 mg/dL (ref 20.0–300.0)
Ethanol: NEGATIVE %
Meperidine: NEGATIVE ng/mL
Methadone Screen, Urine: NEGATIVE ng/mL
Oxycodone/Oxymorphone, Urine: NEGATIVE ng/mL
Phencyclidine: NEGATIVE ng/mL
Propoxyphene: NEGATIVE ng/mL
Tramadol: NEGATIVE ng/mL
pH, Urine: 5.7 (ref 4.5–8.9)

## 2020-07-09 LAB — OPIATES CONFIRMATION, URINE
Codeine: NEGATIVE
Hydrocodone: NEGATIVE
Hydromorphone Confirm: 571 ng/mL
Hydromorphone: POSITIVE — AB
Morphine Confirm: >3000 ng/mL
Morphine: POSITIVE — AB
Opiates: POSITIVE ng/mL — AB

## 2020-07-10 ENCOUNTER — Ambulatory Visit: Payer: Self-pay | Admitting: Clinical

## 2020-07-10 ENCOUNTER — Inpatient Hospital Stay (HOSPITAL_COMMUNITY)
Admission: EM | Admit: 2020-07-10 | Discharge: 2020-07-14 | DRG: 812 | Disposition: A | Discharge status: Home / Self Care | Payer: Medicaid Other | Attending: Internal Medicine | Admitting: Internal Medicine

## 2020-07-10 ENCOUNTER — Emergency Department (HOSPITAL_COMMUNITY): Payer: Medicaid Other

## 2020-07-10 ENCOUNTER — Telehealth (HOSPITAL_COMMUNITY): Payer: Self-pay | Admitting: General Practice

## 2020-07-10 ENCOUNTER — Other Ambulatory Visit: Payer: Self-pay | Admitting: Family Medicine

## 2020-07-10 ENCOUNTER — Encounter (HOSPITAL_COMMUNITY): Payer: Self-pay | Admitting: Emergency Medicine

## 2020-07-10 ENCOUNTER — Other Ambulatory Visit: Payer: Self-pay

## 2020-07-10 DIAGNOSIS — D571 Sickle-cell disease without crisis: Secondary | ICD-10-CM

## 2020-07-10 DIAGNOSIS — D5700 Hb-SS disease with crisis, unspecified: Principal | ICD-10-CM | POA: Diagnosis present

## 2020-07-10 DIAGNOSIS — G8929 Other chronic pain: Secondary | ICD-10-CM | POA: Diagnosis present

## 2020-07-10 DIAGNOSIS — Z79899 Other long term (current) drug therapy: Secondary | ICD-10-CM

## 2020-07-10 DIAGNOSIS — F17210 Nicotine dependence, cigarettes, uncomplicated: Secondary | ICD-10-CM | POA: Diagnosis present

## 2020-07-10 DIAGNOSIS — Z79891 Long term (current) use of opiate analgesic: Secondary | ICD-10-CM

## 2020-07-10 DIAGNOSIS — D638 Anemia in other chronic diseases classified elsewhere: Secondary | ICD-10-CM | POA: Diagnosis present

## 2020-07-10 DIAGNOSIS — F329 Major depressive disorder, single episode, unspecified: Secondary | ICD-10-CM | POA: Diagnosis present

## 2020-07-10 DIAGNOSIS — Z20822 Contact with and (suspected) exposure to covid-19: Secondary | ICD-10-CM | POA: Diagnosis present

## 2020-07-10 DIAGNOSIS — M7918 Myalgia, other site: Secondary | ICD-10-CM | POA: Diagnosis present

## 2020-07-10 DIAGNOSIS — F419 Anxiety disorder, unspecified: Secondary | ICD-10-CM | POA: Diagnosis present

## 2020-07-10 DIAGNOSIS — Z832 Family history of diseases of the blood and blood-forming organs and certain disorders involving the immune mechanism: Secondary | ICD-10-CM

## 2020-07-10 DIAGNOSIS — G894 Chronic pain syndrome: Secondary | ICD-10-CM | POA: Diagnosis present

## 2020-07-10 DIAGNOSIS — F1120 Opioid dependence, uncomplicated: Secondary | ICD-10-CM | POA: Diagnosis present

## 2020-07-10 LAB — CBC WITH DIFFERENTIAL/PLATELET
Abs Immature Granulocytes: 0.04 10*3/uL (ref 0.00–0.07)
Basophils Absolute: 0 10*3/uL (ref 0.0–0.1)
Basophils Relative: 0 %
Eosinophils Absolute: 0 10*3/uL (ref 0.0–0.5)
Eosinophils Relative: 0 %
HCT: 31.9 % — ABNORMAL LOW (ref 36.0–46.0)
Hemoglobin: 11.3 g/dL — ABNORMAL LOW (ref 12.0–15.0)
Immature Granulocytes: 1 %
Lymphocytes Relative: 22 %
Lymphs Abs: 1.7 10*3/uL (ref 0.7–4.0)
MCH: 29.1 pg (ref 26.0–34.0)
MCHC: 35.4 g/dL (ref 30.0–36.0)
MCV: 82.2 fL (ref 80.0–100.0)
Monocytes Absolute: 0.7 10*3/uL (ref 0.1–1.0)
Monocytes Relative: 10 %
Neutro Abs: 5.2 10*3/uL (ref 1.7–7.7)
Neutrophils Relative %: 67 %
Platelets: 513 10*3/uL — ABNORMAL HIGH (ref 150–400)
RBC: 3.88 MIL/uL (ref 3.87–5.11)
RDW: 13.2 % (ref 11.5–15.5)
WBC: 7.8 10*3/uL (ref 4.0–10.5)
nRBC: 0.3 % — ABNORMAL HIGH (ref 0.0–0.2)

## 2020-07-10 LAB — CBC
HCT: 35.5 % — ABNORMAL LOW (ref 36.0–46.0)
Hemoglobin: 11.3 g/dL — ABNORMAL LOW (ref 12.0–15.0)
MCH: 26.8 pg (ref 26.0–34.0)
MCHC: 31.8 g/dL (ref 30.0–36.0)
MCV: 84.1 fL (ref 80.0–100.0)
Platelets: 242 10*3/uL (ref 150–400)
RBC: 4.22 MIL/uL (ref 3.87–5.11)
RDW: 13.9 % (ref 11.5–15.5)
WBC: 6.7 10*3/uL (ref 4.0–10.5)
nRBC: 0 % (ref 0.0–0.2)

## 2020-07-10 LAB — RETICULOCYTES
Immature Retic Fract: 22.5 % — ABNORMAL HIGH (ref 2.3–15.9)
RBC.: 3.83 MIL/uL — ABNORMAL LOW (ref 3.87–5.11)
Retic Count, Absolute: 126.4 10*3/uL (ref 19.0–186.0)
Retic Ct Pct: 3.3 % — ABNORMAL HIGH (ref 0.4–3.1)

## 2020-07-10 LAB — COMPREHENSIVE METABOLIC PANEL
ALT: 46 U/L — ABNORMAL HIGH (ref 0–44)
AST: 37 U/L (ref 15–41)
Albumin: 4.8 g/dL (ref 3.5–5.0)
Alkaline Phosphatase: 70 U/L (ref 38–126)
Anion gap: 10 (ref 5–15)
BUN: 10 mg/dL (ref 6–20)
CO2: 24 mmol/L (ref 22–32)
Calcium: 9.7 mg/dL (ref 8.9–10.3)
Chloride: 103 mmol/L (ref 98–111)
Creatinine, Ser: 0.52 mg/dL (ref 0.44–1.00)
GFR calc Af Amer: >60 mL/min (ref >60)
GFR calc non Af Amer: >60 mL/min (ref >60)
Glucose, Bld: 96 mg/dL (ref 70–99)
Potassium: 3.7 mmol/L (ref 3.5–5.1)
Sodium: 137 mmol/L (ref 135–145)
Total Bilirubin: 1.1 mg/dL (ref 0.3–1.2)
Total Protein: 9.1 g/dL — ABNORMAL HIGH (ref 6.5–8.1)

## 2020-07-10 LAB — TROPONIN I (HIGH SENSITIVITY)
Troponin I (High Sensitivity): <2 ng/L (ref <18)
Troponin I (High Sensitivity): <2 ng/L (ref <18)

## 2020-07-10 LAB — I-STAT BETA HCG BLOOD, ED (MC, WL, AP ONLY): I-stat hCG, quantitative: <5 m[IU]/mL (ref <5)

## 2020-07-10 LAB — SARS CORONAVIRUS 2 BY RT PCR (HOSPITAL ORDER, PERFORMED IN ~~LOC~~ HOSPITAL LAB): SARS Coronavirus 2: NEGATIVE

## 2020-07-10 MED ORDER — DIPHENHYDRAMINE HCL 25 MG PO CAPS
25.0000 mg | ORAL_CAPSULE | ORAL | Status: DC | PRN
  Administered 2020-07-10: 25 mg via ORAL
  Filled 2020-07-10: qty 1

## 2020-07-10 MED ORDER — SODIUM CHLORIDE 0.9% FLUSH: 9.0000 mL | INTRAVENOUS | Status: DC | PRN

## 2020-07-10 MED ORDER — KETOROLAC TROMETHAMINE 15 MG/ML IJ SOLN
15.0000 mg | Freq: Four times a day (QID) | INTRAMUSCULAR | Status: DC
  Administered 2020-07-10 – 2020-07-14 (×14): 15 mg via INTRAVENOUS
  Filled 2020-07-10 (×14): qty 1

## 2020-07-10 MED ORDER — DEXTROSE-NACL 5-0.45 % IV SOLN: INTRAVENOUS | Status: DC

## 2020-07-10 MED ORDER — HYDROMORPHONE HCL 2 MG/ML IJ SOLN
2.0000 mg | INTRAMUSCULAR | Status: AC
  Administered 2020-07-10: 2 mg via INTRAVENOUS
  Filled 2020-07-10: qty 1

## 2020-07-10 MED ORDER — KETOROLAC TROMETHAMINE 15 MG/ML IJ SOLN
15.0000 mg | INTRAMUSCULAR | Status: AC
  Administered 2020-07-10: 15 mg via INTRAVENOUS
  Filled 2020-07-10: qty 1

## 2020-07-10 MED ORDER — LORATADINE 10 MG PO TABS: 10.0000 mg | ORAL_TABLET | Freq: Every day | ORAL | Status: DC

## 2020-07-10 MED ORDER — ONDANSETRON HCL 4 MG/2ML IJ SOLN: 4.0000 mg | Freq: Four times a day (QID) | INTRAMUSCULAR | Status: DC | PRN

## 2020-07-10 MED ORDER — HYDROMORPHONE HCL 2 MG/ML IJ SOLN
2.0000 mg | Freq: Once | INTRAMUSCULAR | Status: AC
  Administered 2020-07-10: 2 mg via INTRAVENOUS
  Filled 2020-07-10: qty 1

## 2020-07-10 MED ORDER — FOLIC ACID 1 MG PO TABS
1.0000 mg | ORAL_TABLET | Freq: Every day | ORAL | Status: DC
  Administered 2020-07-10 – 2020-07-14 (×5): 1 mg via ORAL
  Filled 2020-07-10 (×5): qty 1

## 2020-07-10 MED ORDER — POLYETHYLENE GLYCOL 3350 17 G PO PACK: 17.0000 g | PACK | Freq: Every day | ORAL | Status: DC | PRN

## 2020-07-10 MED ORDER — AMOXICILLIN-POT CLAVULANATE 875-125 MG PO TABS
1.0000 | ORAL_TABLET | Freq: Two times a day (BID) | ORAL | Status: DC
  Administered 2020-07-10 – 2020-07-13 (×6): 1 via ORAL
  Filled 2020-07-10 (×6): qty 1

## 2020-07-10 MED ORDER — HYDROMORPHONE 1 MG/ML IV SOLN
INTRAVENOUS | Status: DC
  Administered 2020-07-10: 30 mg via INTRAVENOUS
  Administered 2020-07-10: 6 mg via INTRAVENOUS
  Administered 2020-07-11: 5.4 mg via INTRAVENOUS
  Administered 2020-07-11: 3.6 mg via INTRAVENOUS
  Administered 2020-07-11: 4.2 mg via INTRAVENOUS
  Administered 2020-07-11: 30 mg via INTRAVENOUS
  Administered 2020-07-11: 12.6 mg via INTRAVENOUS
  Administered 2020-07-11: 30 mg via INTRAVENOUS
  Administered 2020-07-11: 3.6 mg via INTRAVENOUS
  Administered 2020-07-12: 13.8 mg via INTRAVENOUS
  Administered 2020-07-12: 3.6 mg via INTRAVENOUS
  Administered 2020-07-12: 5.4 mg via INTRAVENOUS
  Administered 2020-07-12: 8 mg via INTRAVENOUS
  Administered 2020-07-12: 30 mg via INTRAVENOUS
  Administered 2020-07-12: 8.6 mg via INTRAVENOUS
  Administered 2020-07-13: 30 mg via INTRAVENOUS
  Administered 2020-07-13 (×2): 7.2 mg via INTRAVENOUS
  Administered 2020-07-13: 30 mg via INTRAVENOUS
  Administered 2020-07-13: 8.4 mg via INTRAVENOUS
  Administered 2020-07-13: 4.2 mg via INTRAVENOUS
  Administered 2020-07-13: 6.6 mg via INTRAVENOUS
  Administered 2020-07-14: 7.8 mg via INTRAVENOUS
  Administered 2020-07-14: 6.6 mg via INTRAVENOUS
  Filled 2020-07-10 (×6): qty 30

## 2020-07-10 MED ORDER — NALOXONE HCL 0.4 MG/ML IJ SOLN: 0.4000 mg | INTRAMUSCULAR | Status: DC | PRN

## 2020-07-10 MED ORDER — CITALOPRAM HYDROBROMIDE 20 MG PO TABS
10.0000 mg | ORAL_TABLET | Freq: Every day | ORAL | Status: DC
  Administered 2020-07-10 – 2020-07-14 (×5): 10 mg via ORAL
  Filled 2020-07-10 (×5): qty 1

## 2020-07-10 MED ORDER — SENNOSIDES-DOCUSATE SODIUM 8.6-50 MG PO TABS
1.0000 | ORAL_TABLET | Freq: Two times a day (BID) | ORAL | Status: DC
  Administered 2020-07-10 – 2020-07-14 (×8): 1 via ORAL
  Filled 2020-07-10 (×8): qty 1

## 2020-07-10 MED ORDER — ONDANSETRON HCL 4 MG/2ML IJ SOLN: 4.0000 mg | INTRAMUSCULAR | Status: DC | PRN

## 2020-07-10 MED ORDER — OXYCODONE HCL 5 MG PO TABS
10.0000 mg | ORAL_TABLET | Freq: Four times a day (QID) | ORAL | Status: DC | PRN
  Administered 2020-07-11 – 2020-07-13 (×3): 10 mg via ORAL
  Filled 2020-07-10 (×3): qty 2

## 2020-07-10 MED ORDER — MORPHINE SULFATE ER 30 MG PO TBCR
60.0000 mg | EXTENDED_RELEASE_TABLET | Freq: Two times a day (BID) | ORAL | Status: DC
  Administered 2020-07-10 – 2020-07-14 (×9): 60 mg via ORAL
  Filled 2020-07-10 (×5): qty 2
  Filled 2020-07-10: qty 4
  Filled 2020-07-10 (×3): qty 2

## 2020-07-10 NOTE — ED Provider Notes (Signed)
[facility] DEPT Provider Note   CSN: [id number] Arrival date & time: 07/10/20  2458     History Chief Complaint  Patient presents with  . Sickle Cell Pain Crisis    Carmen [name] is a 29 y.o. female.  The history is provided by the patient and medical records. No language interpreter was used.  Sickle Cell Pain Crisis  Carmen [name] is a 29 y.o. female who presents to the Emergency Department complaining of sickle cell pain crisis. She presents the emergency department complaining of pain crisis that began three days ago. She complains of pain throughout her entire body including chest pain and back pain. She states the pain is similar compared to prior pain crises, which has been worse since she had her child one year ago. She is not currently breast-feeding. She denies any fevers, difficulty breathing, vomiting, diarrhea. She is compliant with her home medications, which include morphine and oxycodone. Her last dose was last night. When she woke this morning the pain was even worse than she presents for treatment. No known sick contacts. She has been fully vaccinated for COVID-19. No history of DVT/PE. She is not on any hormones.    Past Medical History:  Diagnosis Date  . Anxiety   . Headache(784.0)   . Heart murmur   . Sickle cell crisis ([place])   . Syphilis 2015   Was diagnosed and received one injection of antibiotics  . Thrombocytosis ([place]) 11/22/2014     CBC Latest Ref Rng & Units 12/13/2018 12/11/2018 12/10/2018 WBC 4.0 - 10.5 K/uL 14.8(H) 13.2(H) 15.9(H) Hemoglobin 12.0 - 15.0 g/dL 8.2(L) 7.7(L) 8.1(L) Hematocrit 36.0 - 46.0 % 23.7(L) 23.2(L) 24.5(L) Platelets 150 - 400 K/uL 326 399 388      Patient Active Problem List   Diagnosis Date Noted  . Sickle cell crisis ([place]) 03/31/2020  . Bacterial vaginitis 01/04/2020  . Acute cystitis without hematuria   . Dysuria 12/31/2019  . Urine leukocytes   . Sickle cell pain crisis ([place]) 09/10/2019  .  Sickle cell disease ([place]) 12/04/2018  . Alpha thalassemia silent carrier 12/04/2018  . Chronic prescription opiate use 03/13/2018  . Chronic musculoskeletal pain 03/13/2018  . Vitamin D deficiency 01/13/2018  . Leukocytosis 08/21/2017  . 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 Hb-SS disease without crisis ([place]) 08/15/2016  . Anemia of chronic disease   . Chest pain 11/10/2015  . Systolic ejection murmur 09/98/3382  . Sickle cell anemia of mother during pregnancy [facility]) 05/16/2014    Past Surgical History:  Procedure Laterality Date  . CESAREAN SECTION N/A 02/05/2019   Procedure: CESAREAN SECTION;  Surgeon: [name], MD;  Location: [facility] LD ORS;  Service: Obstetrics;  Laterality: N/A;  . CHOLECYSTECTOMY N/A 11/30/2014   Procedure: LAPAROSCOPIC CHOLECYSTECTOMY SINGLE SITE WITH INTRAOPERATIVE CHOLANGIOGRAM;  Surgeon: Michael Boston, MD;  Location: [facility] ORS;  Service: General;  Laterality: N/A;  . SPLENECTOMY       OB History    Gravida  2   Para  1   Term  1   Preterm      AB  1   Living  1     SAB      TAB  1   Ectopic      Multiple  0   Live Births  1           Family History  Problem Relation Age of Onset  . Hypertension Mother   .  Sickle cell anemia Sister   . Kidney disease Sister        Lupus  . Arthritis Sister   . Sickle cell anemia Sister   . Sickle cell trait Sister   . Heart disease Maternal Aunt        CABG  . Heart disease Maternal Uncle        CABG  . Lupus Sister     Social History   Tobacco Use  . Smoking status: Current Some Day Smoker    Types: Cigarettes  . Smokeless tobacco: Never Used  Vaping Use  . Vaping Use: Never used  Substance Use Topics  . Alcohol use: No  . Drug use: No    Home Medications Prior to Admission medications   Medication Sig Start Date End Date Taking? Authorizing Provider  amoxicillin-clavulanate (AUGMENTIN) 875-125 MG tablet Take 1 tablet by mouth 2 (two) times daily for 10  days. 07/02/20 07/12/20  Vevelyn Francois, NP  citalopram (CELEXA) 10 MG tablet Take 1 tablet (10 mg total) by mouth daily. 05/22/20 08/20/20  Vevelyn Francois, NP  folic acid (FOLVITE) 1 MG tablet Take 1 tablet (1 mg total) by mouth daily. 03/15/19   Lanae Boast, FNP  loratadine (CLARITIN) 10 MG tablet Take 1 tablet (10 mg total) by mouth daily. Patient taking differently: Take 10 mg by mouth daily as needed for allergies.  03/28/20   Vevelyn Francois, NP  morphine (MS CONTIN) 60 MG 12 hr tablet Take 1 tablet (60 mg total) by mouth every 12 (twelve) hours. 06/11/20 07/11/20  Azzie Glatter, FNP  Oxycodone HCl 10 MG TABS Take 1 tablet (10 mg total) by mouth every 6 (six) hours as needed for up to 15 days (PAIN). 07/02/20 07/17/20  Vevelyn Francois, NP  Prenatal Vit-Fe Fumarate-FA (PRENATAL VITAMIN PLUS LOW IRON) 27-1 MG TABS TAKE 1 TABLET BY MOUTH EVERY DAY Patient taking differently: Take 1 tablet by mouth daily.  03/03/20   Shelly Bombard, MD  Vitamin D, Ergocalciferol, (DRISDOL) 1.25 MG (50000 UT) CAPS capsule TAKE ONE CAPSULE BY MOUTH ONE TIME PER WEEK Patient taking differently: Take 50,000 Units by mouth every 7 (seven) days.  10/11/19   Azzie Glatter, FNP    Allergies    Patient has no known allergies.  Review of Systems   Review of Systems  All other systems reviewed and are negative.   Physical Exam Updated Vital Signs BP 118/84   Pulse (!) 101   Temp 98.9 F (37.2 C) (Oral)   Resp 20   Ht 5\' 3"  (1.6 m)   Wt 85.3 kg   SpO2 100%   BMI 33.30 kg/m   Physical Exam Vitals and nursing note reviewed.  Constitutional:      Appearance: She is well-developed.     Comments: Appears uncomfortable  HENT:     Head: Normocephalic and atraumatic.  Cardiovascular:     Rate and Rhythm: Normal rate and regular rhythm.     Heart sounds: No murmur heard.   Pulmonary:     Effort: Pulmonary effort is normal. No respiratory distress.     Breath sounds: Normal breath sounds.  Abdominal:       Palpations: Abdomen is soft.     Tenderness: There is no abdominal tenderness. There is no guarding or rebound.  Musculoskeletal:        General: No swelling or tenderness.  Skin:    General: Skin is warm and dry.  Neurological:  Mental Status: She is alert and oriented to person, place, and time.  Psychiatric:        Behavior: Behavior normal.     ED Results / Procedures / Treatments   Labs (all labs ordered are listed, but only abnormal results are displayed) Labs Reviewed  COMPREHENSIVE METABOLIC PANEL - Abnormal; Notable for the following components:      Result Value   Total Protein 9.1 (*)    ALT 46 (*)    All other components within normal limits  CBC WITH DIFFERENTIAL/PLATELET - Abnormal; Notable for the following components:   Hemoglobin 11.3 (*)    HCT 31.9 (*)    Platelets 513 (*)    nRBC 0.3 (*)    All other components within normal limits  RETICULOCYTES - Abnormal; Notable for the following components:   Retic Ct Pct 3.3 (*)    RBC. 3.83 (*)    Immature Retic Fract 22.5 (*)    All other components within normal limits  SARS CORONAVIRUS 2 BY RT PCR (HOSPITAL ORDER, [facility])  I-STAT BETA HCG BLOOD, ED ([facility], [facility], [facility] ONLY)  TROPONIN I (HIGH SENSITIVITY)  TROPONIN I (HIGH SENSITIVITY)    EKG EKG Interpretation  Date/Time:  Thursday July 10 2020 11:36:42 EDT Ventricular Rate:  87 PR Interval:    QRS Duration: 102 QT Interval:  362 QTC Calculation: 436 R Axis:   61 Text Interpretation: Sinus rhythm Confirmed by Quintella Reichert [id number]) on 07/10/2020 1:08:10 PM   Radiology DG Chest Port 1 View  Result Date: 07/10/2020 CLINICAL DATA:  Chest pain EXAM: PORTABLE CHEST 1 VIEW COMPARISON:  06/09/2020 FINDINGS: The heart size and mediastinal contours are within normal limits. Both lungs are clear. No acute osseous findings. IMPRESSION: No active disease. Electronically Signed   By: Davina Poke D.O.   On: 07/10/2020 11:34     Procedures Procedures (including critical care time)  Medications Ordered in ED Medications  dextrose 5 %-0.45 % sodium chloride infusion ( Intravenous New Bag/Given 07/10/20 1154)  diphenhydrAMINE (BENADRYL) capsule 25-50 mg (25 mg Oral Given 07/10/20 1145)  ondansetron (ZOFRAN) injection 4 mg (has no administration in time range)  ketorolac (TORADOL) 15 MG/ML injection 15 mg (15 mg Intravenous Given 07/10/20 1150)  HYDROmorphone (DILAUDID) injection 2 mg (2 mg Intravenous Given 07/10/20 1146)  HYDROmorphone (DILAUDID) injection 2 mg (2 mg Intravenous Given 07/10/20 1234)  HYDROmorphone (DILAUDID) injection 2 mg (2 mg Intravenous Given 07/10/20 1314)    ED Course  I have reviewed the triage vital signs and the nursing notes.  Pertinent labs & imaging results that were available during my care of the patient were reviewed by me and considered in my medical decision making (see chart for details).    MDM Rules/Calculators/A&P                         Patient with history of sickle cell anemia here for evaluation of pain similar to prior pain crises. She was treated with multiple doses of pain medications in the emergency department with ongoing pain. Chest x-ray is not consistent with acute chest. Labs are near her baseline was stable anemia. Given ongoing pain plan to admit for sickle cell pain crisis. Discussed with [place] Hollis, who will see the patient for admission.  Final Clinical Impression(s) / ED Diagnoses Final diagnoses:  Sickle cell pain crisis ([place])    Rx / DC Orders ED Discharge Orders    None  Quintella Reichert, MD 07/10/20 1344

## 2020-07-10 NOTE — H&P (Signed)
H&P  Patient Demographics:  Carmen [name], is a 29 y.o. female  MRN: [id number]   DOB - [birth year]  Admit Date - 07/10/2020  Outpatient Primary MD for the patient is Vevelyn Francois, NP  Chief Complaint  Patient presents with  . Sickle Cell Pain Crisis      HPI:   Carmen [name]  is a 29 y.o. female with a medical history significant for sickle cell disease, chronic pain syndrome, opiate dependence and tolerance, and history of anemia of chronic disease presents complaining of generalized pain that is consistent with her previous pain crisis.  She says that pain was worsened on awakening this a.m.  She characterizes pain as constant and throbbing.  Patient states that pain intensity increased 3 days ago and has been unrelieved by home medications.  Patient complains of pain throughout her entire body including upper and lower extreme remedies.  She says says that she had chest pains, which is since resolved.  Patient has not identified any aggravating or alleviating factors contained in the current crisis.  She denies any subjective fever, chills, headache, dizziness, shortness of breath, or chest pain.  No nausea, vomiting, or diarrhea.   ER Course:  Vital signs show BP 114/85   Pulse (!) 101   Temp 98.9 F (37.2 C) (Oral)   Resp 18   Ht 5\' 3"  (1.6 m)   Wt 85.3 kg   SpO2 99%   BMI 33.30 kg/m .  Patient's hemoglobin is 11.3, consistent with her baseline.  All other laboratory values largely within normal limits.  Patient's pain persists despite IV Dilaudid, IV fluids, and IV Toradol.  Sickle cell team notified for admission.  Patient admitted in observation for further management of sickle cell pain crisis.    Review of systems:  In addition to the HPI above, patient reports Review of Systems  Constitutional: Negative for chills and fever.  HENT: Negative.   Eyes: Negative.   Respiratory: Negative.   Cardiovascular: Negative for chest pain.  Gastrointestinal: Negative.    Genitourinary: Negative.   Musculoskeletal: Positive for back pain and joint pain.  Skin: Negative.   Neurological: Negative.   Endo/Heme/Allergies: Negative.   Psychiatric/Behavioral: Negative.     A full 10 point Review of Systems was done, except as stated above, all other Review of Systems were negative.  With Past History of the following :   Past Medical History:  Diagnosis Date  . Anxiety   . Headache(784.0)   . Heart murmur   . Sickle cell crisis ([facility])   . Syphilis 2015   Was diagnosed and received one injection of antibiotics  . Thrombocytosis ([place]) 11/22/2014     CBC Latest Ref Rng & Units 12/13/2018 12/11/2018 12/10/2018 WBC 4.0 - 10.5 K/uL 14.8(H) 13.2(H) 15.9(H) Hemoglobin 12.0 - 15.0 g/dL 8.2(L) 7.7(L) 8.1(L) Hematocrit 36.0 - 46.0 % 23.7(L) 23.2(L) 24.5(L) Platelets 150 - 400 K/uL 326 399 388        Past Surgical History:  Procedure Laterality Date  . CESAREAN SECTION N/A 02/05/2019   Procedure: CESAREAN SECTION;  Surgeon: [name], MD;  Location: [facility] LD ORS;  Service: Obstetrics;  Laterality: N/A;  . CHOLECYSTECTOMY N/A 11/30/2014   Procedure: LAPAROSCOPIC CHOLECYSTECTOMY SINGLE SITE WITH INTRAOPERATIVE CHOLANGIOGRAM;  Surgeon: Michael Boston, MD;  Location: [facility] ORS;  Service: General;  Laterality: N/A;  . SPLENECTOMY       Social History:   Social History   Tobacco Use  . Smoking status: Current Some Day Smoker  Types: Cigarettes  . Smokeless tobacco: Never Used  Substance Use Topics  . Alcohol use: No     Lives - At home   Family History :   Family History  Problem Relation Age of Onset  . Hypertension Mother   . Sickle cell anemia Sister   . Kidney disease Sister        Lupus  . Arthritis Sister   . Sickle cell anemia Sister   . Sickle cell trait Sister   . Heart disease Maternal Aunt        CABG  . Heart disease Maternal Uncle        CABG  . Lupus Sister      Home Medications:   Prior to Admission medications   Medication Sig  Start Date End Date Taking? Authorizing Provider  amoxicillin-clavulanate (AUGMENTIN) 875-125 MG tablet Take 1 tablet by mouth 2 (two) times daily for 10 days. 07/02/20 07/12/20  Vevelyn Francois, NP  citalopram (CELEXA) 10 MG tablet Take 1 tablet (10 mg total) by mouth daily. 05/22/20 08/20/20  Vevelyn Francois, NP  folic acid (FOLVITE) 1 MG tablet Take 1 tablet (1 mg total) by mouth daily. 03/15/19   Lanae Boast, FNP  loratadine (CLARITIN) 10 MG tablet Take 1 tablet (10 mg total) by mouth daily. Patient taking differently: Take 10 mg by mouth daily as needed for allergies.  03/28/20   Vevelyn Francois, NP  morphine (MS CONTIN) 60 MG 12 hr tablet Take 1 tablet (60 mg total) by mouth every 12 (twelve) hours. 06/11/20 07/11/20  Azzie Glatter, FNP  Oxycodone HCl 10 MG TABS Take 1 tablet (10 mg total) by mouth every 6 (six) hours as needed for up to 15 days (PAIN). 07/02/20 07/17/20  Vevelyn Francois, NP  Prenatal Vit-Fe Fumarate-FA (PRENATAL VITAMIN PLUS LOW IRON) 27-1 MG TABS TAKE 1 TABLET BY MOUTH EVERY DAY Patient taking differently: Take 1 tablet by mouth daily.  03/03/20   Shelly Bombard, MD  Vitamin D, Ergocalciferol, (DRISDOL) 1.25 MG (50000 UT) CAPS capsule TAKE ONE CAPSULE BY MOUTH ONE TIME PER WEEK Patient taking differently: Take 50,000 Units by mouth every 7 (seven) days.  10/11/19   Azzie Glatter, FNP     Allergies:   No Known Allergies   Physical Exam:   Vitals:   Vitals:   07/10/20 1300 07/10/20 1329  BP: 118/84 114/85  Pulse:    Resp: 20 18  Temp:    SpO2: 100% 99%    Physical Exam: Constitutional: Patient appears well-developed and well-nourished. Not in obvious distress. HENT: Normocephalic, atraumatic, External right and left ear normal. Oropharynx is clear and moist.  Eyes: Conjunctivae and EOM are normal. PERRLA, no scleral icterus. Neck: Normal ROM. Neck supple. No JVD. No tracheal deviation. No thyromegaly. CVS: RRR, S1/S2 +, no murmurs, no gallops, no carotid  bruit.  Pulmonary: Effort and breath sounds normal, no stridor, rhonchi, wheezes, rales.  Abdominal: Soft. BS +, no distension, tenderness, rebound or guarding.  Musculoskeletal: Normal range of motion. No edema and no tenderness.  Lymphadenopathy: No lymphadenopathy noted, cervical, inguinal or axillary Neuro: Alert. Normal reflexes, muscle tone coordination. No cranial nerve deficit. Skin: Skin is warm and dry. No rash noted. Not diaphoretic. No erythema. No pallor. Psychiatric: Normal mood and affect. Behavior, judgment, thought content normal.   Data Review:   CBC Recent Labs  Lab 07/10/20 1121  WBC 7.8  HGB 11.3*  HCT 31.9*  PLT 513*  MCV 82.2  MCH 29.1  MCHC 35.4  RDW 13.2  LYMPHSABS 1.7  MONOABS 0.7  EOSABS 0.0  BASOSABS 0.0   ------------------------------------------------------------------------------------------------------------------  Chemistries  Recent Labs  Lab 07/10/20 1121  NA 137  K 3.7  CL 103  CO2 24  GLUCOSE 96  BUN 10  CREATININE 0.52  CALCIUM 9.7  AST 37  ALT 46*  ALKPHOS 70  BILITOT 1.1   ------------------------------------------------------------------------------------------------------------------ estimated creatinine clearance is 109.4 mL/min (by C-G formula based on SCr of 0.52 mg/dL). ------------------------------------------------------------------------------------------------------------------ No results for input(s): TSH, T4TOTAL, T3FREE, THYROIDAB in the last 72 hours.  Invalid input(s): FREET3  Coagulation profile No results for input(s): INR, PROTIME in the last 168 hours. ------------------------------------------------------------------------------------------------------------------- No results for input(s): DDIMER in the last 72 hours. -------------------------------------------------------------------------------------------------------------------  Cardiac Enzymes No results for input(s): CKMB, TROPONINI,  MYOGLOBIN in the last 168 hours.  Invalid input(s): CK ------------------------------------------------------------------------------------------------------------------ No results found for: BNP  ---------------------------------------------------------------------------------------------------------------  Urinalysis    Component Value Date/Time   COLORURINE YELLOW 12/31/2019 1213   APPEARANCEUR CLEAR 12/31/2019 1213   LABSPEC 1.011 12/31/2019 1213   PHURINE 6.0 12/31/2019 1213   GLUCOSEU NEGATIVE 12/31/2019 1213   HGBUR NEGATIVE 12/31/2019 [place] 12/31/2019 1213   BILIRUBINUR Negative 08/10/2019 1534   KETONESUR NEGATIVE 12/31/2019 1213   PROTEINUR NEGATIVE 12/31/2019 1213   UROBILINOGEN 1.0 08/10/2019 1534   UROBILINOGEN 1.0 01/12/2018 0943   NITRITE Negative 07/02/2020 1309   NITRITE NEGATIVE 12/31/2019 1213   LEUKOCYTESUR Negative 07/02/2020 1309   LEUKOCYTESUR SMALL (A) 12/31/2019 1213    ----------------------------------------------------------------------------------------------------------------   Imaging Results:    DG Chest Port 1 View  Result Date: 07/10/2020 CLINICAL DATA:  Chest pain EXAM: PORTABLE CHEST 1 VIEW COMPARISON:  06/09/2020 FINDINGS: The heart size and mediastinal contours are within normal limits. Both lungs are clear. No acute osseous findings. IMPRESSION: No active disease. Electronically Signed   By: Davina Poke D.O.   On: 07/10/2020 11:34     Assessment & Plan:  Principal Problem:   Sickle cell pain crisis ([place]) Active Problems:   Anemia of chronic disease   Chronic musculoskeletal pain  Sickle cell disease with pain crisis: Admit to MedSurg.  Initiate IV Dilaudid PCA with settings of 0.6 mg, 10-minute lockout, and 4 mg/h. Toradol 15 mg IV every 6 hours Oxycodone 15 mg every 4 hours as needed for severe breakthrough pain Monitor vital signs closely, reevaluate pain scale regularly, and supplemental oxygen as  needed.  Sickle cell anemia: Hemoglobin is 11.3, consistent with patient's baseline.  There is no clinical indication for blood transfusion at this time. Continue folic acid 1 mg daily.  Patient is not on disease modifying agents at this time.  Patient has failed hydroxyurea therapy in the past.  Chronic pain syndrome: MS Contin 30 mg every 12 hours Oxycodone 15 mg every 4 hours as needed Continue to monitor closely    DVT Prophylaxis: SCDs  AM Labs Ordered, also please review Full Orders  Family Communication: Admission, patient's condition and plan of care including tests being ordered have been discussed with the patient who indicate understanding and agree with the plan and Code Status.  Code Status: Full Code  Consults called: None    Admission status: Inpatient    Time spent in minutes : 35 minutes  [place], MSN, FNP-C [facility] [address] [place], [place] [address] [phone]  07/10/2020 at 2:07 PM

## 2020-07-10 NOTE — Progress Notes (Signed)
Received report from [place], Therapist, sports.  Awaiting patients transport to Room 1613.

## 2020-07-10 NOTE — Telephone Encounter (Signed)
Patient called from  [facility], requesting to come to the day hospital due to pain in the chest and arms rated at 10/10.  Per provider, [facility] does not have the capacity to see the patient today. Patient told to remain at the ER to be seen especially because of the chest pain. Patient verbalized understanding.

## 2020-07-10 NOTE — ED Triage Notes (Signed)
Per EMS- patient c/o sickle cell pain that is unrelieved by home meds x 3 days.

## 2020-07-11 ENCOUNTER — Telehealth: Payer: Self-pay | Admitting: Family Medicine

## 2020-07-11 DIAGNOSIS — Z79899 Other long term (current) drug therapy: Secondary | ICD-10-CM | POA: Diagnosis not present

## 2020-07-11 DIAGNOSIS — F419 Anxiety disorder, unspecified: Secondary | ICD-10-CM | POA: Diagnosis present

## 2020-07-11 DIAGNOSIS — Z79891 Long term (current) use of opiate analgesic: Secondary | ICD-10-CM | POA: Diagnosis not present

## 2020-07-11 DIAGNOSIS — F1120 Opioid dependence, uncomplicated: Secondary | ICD-10-CM | POA: Diagnosis present

## 2020-07-11 DIAGNOSIS — M7918 Myalgia, other site: Secondary | ICD-10-CM | POA: Diagnosis present

## 2020-07-11 DIAGNOSIS — G894 Chronic pain syndrome: Secondary | ICD-10-CM | POA: Diagnosis present

## 2020-07-11 DIAGNOSIS — D5700 Hb-SS disease with crisis, unspecified: Secondary | ICD-10-CM | POA: Diagnosis present

## 2020-07-11 DIAGNOSIS — Z832 Family history of diseases of the blood and blood-forming organs and certain disorders involving the immune mechanism: Secondary | ICD-10-CM | POA: Diagnosis not present

## 2020-07-11 DIAGNOSIS — Z20822 Contact with and (suspected) exposure to covid-19: Secondary | ICD-10-CM | POA: Diagnosis present

## 2020-07-11 DIAGNOSIS — D571 Sickle-cell disease without crisis: Secondary | ICD-10-CM | POA: Diagnosis not present

## 2020-07-11 DIAGNOSIS — F17210 Nicotine dependence, cigarettes, uncomplicated: Secondary | ICD-10-CM | POA: Diagnosis present

## 2020-07-11 DIAGNOSIS — F329 Major depressive disorder, single episode, unspecified: Secondary | ICD-10-CM | POA: Diagnosis present

## 2020-07-11 LAB — BASIC METABOLIC PANEL
Anion gap: 14 (ref 5–15)
BUN: 16 mg/dL (ref 6–20)
CO2: 24 mmol/L (ref 22–32)
Calcium: 9.8 mg/dL (ref 8.9–10.3)
Chloride: 100 mmol/L (ref 98–111)
Creatinine, Ser: 0.63 mg/dL (ref 0.44–1.00)
GFR calc Af Amer: >60 mL/min (ref >60)
GFR calc non Af Amer: >60 mL/min (ref >60)
Glucose, Bld: 103 mg/dL — ABNORMAL HIGH (ref 70–99)
Potassium: 4.3 mmol/L (ref 3.5–5.1)
Sodium: 138 mmol/L (ref 135–145)

## 2020-07-11 MED ORDER — DIPHENHYDRAMINE HCL 25 MG PO CAPS
25.0000 mg | ORAL_CAPSULE | ORAL | Status: DC | PRN
  Administered 2020-07-11: 50 mg via ORAL
  Filled 2020-07-11: qty 2

## 2020-07-11 NOTE — Progress Notes (Signed)
Subjective: Carmen [name] is a 29 year old female with a medical history significant for sickle cell disease, chronic pain syndrome, opiate dependence and tolerance, and history of anemia of chronic disease was admitted for sickle cell pain crisis.  Patient states that pain has not been controlled on current medication regimen.  Patient has poor venous access and lost IV site overnight.  Patient's IV Dilaudid PCA has been restarted.  Patient rates pain at 10/10 that is primarily to low back and lower extremities.  She denies headache, chest pain, urinary symptoms, nausea, vomiting, or diarrhea.  Objective:  Vital signs in last 24 hours:  Vitals:   07/11/20 0411 07/11/20 0540 07/11/20 1048 07/11/20 1051  BP:  105/70  124/65  Pulse:  80  72  Resp: 16 15 12 11   Temp:  97.7 F (36.5 C)  98.4 F (36.9 C)  TempSrc:  Oral  Oral  SpO2: 98% 93% 92% 98%  Weight:      Height:        Intake/Output from previous day:   Intake/Output Summary (Last 24 hours) at 07/11/2020 1100 Last data filed at 07/11/2020 0421 Gross per 24 hour  Intake 1926.57 ml  Output 350 ml  Net 1576.57 ml    Physical Exam: General: Alert, awake, oriented x3, in no acute distress.  HEENT: [place]/AT PEERL, EOMI Neck: Trachea midline,  no masses, no thyromegal,y no JVD, no carotid bruit OROPHARYNX:  Moist, No exudate/ erythema/lesions.  Heart: Regular rate and rhythm, without murmurs, rubs, gallops, PMI non-displaced, no heaves or thrills on palpation.  Lungs: Clear to auscultation, no wheezing or rhonchi noted. No increased vocal fremitus resonant to percussion  Abdomen: Soft, nontender, nondistended, positive bowel sounds, no masses no hepatosplenomegaly noted..  Neuro: No focal neurological deficits noted cranial nerves II through XII grossly intact. DTRs 2+ bilaterally upper and lower extremities. Strength 5 out of 5 in bilateral upper and lower extremities. Musculoskeletal: No warm swelling or erythema around joints, no  spinal tenderness noted. Psychiatric: Patient alert and oriented x3, good insight and cognition, good recent to remote recall. Lymph node survey: No cervical axillary or inguinal lymphadenopathy noted.  Lab Results:  Basic Metabolic Panel:    Component Value Date/Time   NA 138 07/11/2020 0801   NA 142 07/02/2020 1040   K 4.3 07/11/2020 0801   CL 100 07/11/2020 0801   CO2 24 07/11/2020 0801   BUN 16 07/11/2020 0801   BUN 8 07/02/2020 1040   CREATININE 0.63 07/11/2020 0801   GLUCOSE 103 (H) 07/11/2020 0801   CALCIUM 9.8 07/11/2020 0801   CBC:    Component Value Date/Time   WBC 6.7 07/10/2020 1754   HGB 11.3 (L) 07/10/2020 1754   HGB 9.8 (L) 07/02/2020 1040   HCT 35.5 (L) 07/10/2020 1754   HCT 28.9 (L) 07/02/2020 1040   PLT 242 07/10/2020 1754   PLT 628 (H) 07/02/2020 1040   MCV 84.1 07/10/2020 1754   MCV 88 07/02/2020 1040   NEUTROABS 5.2 07/10/2020 1121   NEUTROABS 3.0 07/02/2020 1040   LYMPHSABS 1.7 07/10/2020 1121   LYMPHSABS 3.5 (H) 07/02/2020 1040   MONOABS 0.7 07/10/2020 1121   EOSABS 0.0 07/10/2020 1121   EOSABS 0.2 07/02/2020 1040   BASOSABS 0.0 07/10/2020 1121   BASOSABS 0.1 07/02/2020 1040    Recent Results (from the past 240 hour(s))  SARS Coronavirus 2 by RT PCR (hospital order, performed in [facility] hospital lab) Nasopharyngeal Nasopharyngeal Swab     Status: None  Collection Time: 07/10/20  1:20 PM   Specimen: Nasopharyngeal Swab  Result Value Ref Range Status   SARS Coronavirus 2 NEGATIVE NEGATIVE Final    Comment: (NOTE) SARS-CoV-2 target nucleic acids are NOT DETECTED.  The SARS-CoV-2 RNA is generally detectable in upper and lower respiratory specimens during the acute phase of infection. The lowest concentration of SARS-CoV-2 viral copies this assay can detect is 250 copies / mL. A negative result does not preclude SARS-CoV-2 infection and should not be used as the sole basis for treatment or other patient management decisions.  A negative  result may occur with improper specimen collection / handling, submission of specimen other than nasopharyngeal swab, presence of viral mutation(s) within the areas targeted by this assay, and inadequate number of viral copies (<250 copies / mL). A negative result must be combined with clinical observations, patient history, and epidemiological information.  Fact Sheet for Patients:   StrictlyIdeas.no  Fact Sheet for Healthcare Providers: BankingDealers.co.za  This test is not yet approved or  cleared by the [place] FDA and has been authorized for detection and/or diagnosis of SARS-CoV-2 by FDA under an Emergency Use Authorization (EUA).  This EUA will remain in effect (meaning this test can be used) for the duration of the COVID-19 declaration under Section 564(b)(1) of the Act, 21 U.S.C. section 360bbb-3(b)(1), unless the authorization is terminated or revoked sooner.  Performed at [facility], [place] [address], [place], [place] [address]     Studies/Results: DG Chest Port 1 View  Result Date: 07/10/2020 CLINICAL DATA:  Chest pain EXAM: PORTABLE CHEST 1 VIEW COMPARISON:  06/09/2020 FINDINGS: The heart size and mediastinal contours are within normal limits. Both lungs are clear. No acute osseous findings. IMPRESSION: No active disease. Electronically Signed   By: Davina Poke D.O.   On: 07/10/2020 11:34    Medications: Scheduled Meds:  amoxicillin-clavulanate  1 tablet Oral BID   citalopram  10 mg Oral Daily   folic acid  1 mg Oral Daily   HYDROmorphone   Intravenous Q4H   ketorolac  15 mg Intravenous Q6H   morphine  60 mg Oral Q12H   senna-docusate  1 tablet Oral BID   Continuous Infusions:  dextrose 5 % and 0.45% NaCl 75 mL/hr at 07/11/20 1043   PRN Meds:.diphenhydrAMINE, naloxone **AND** sodium chloride flush, ondansetron (ZOFRAN) IV, oxyCODONE, polyethylene  glycol  Consultants:  None  Procedures:  None  Antibiotics:  None  Assessment/Plan: Principal Problem:   Sickle cell pain crisis ([facility]) Active Problems:   Anemia of chronic disease   Chronic musculoskeletal pain  Sickle cell disease with pain crisis: Decrease IV fluids to KVO Continue IV Dilaudid PCA, no changes in settings IV Toradol 15 mg every 6 hours for total of 5 days Monitor vital signs closely, reevaluate pain scale regularly, and supplemental oxygen as needed.  Probable discharge on 07/12/2020  Sickle cell anemia: Hemoglobin is stable and consistent with patient's baseline.  There is no clinical indication for blood transfusion on today.  Continue to follow closely.  Chronic pain syndrome: Continue home medications     Code Status: Full Code Family Communication: N/A Disposition Plan: Not yet ready for discharge   If 5PM-7AM, please contact night-coverage.  07/11/2020, 11:00 AM  LOS: 0 days

## 2020-07-11 NOTE — Telephone Encounter (Signed)
Patient still in hospital hopefully she will be discharged in the next 2 hours if not I will get it later this afternoon.  Unable to send

## 2020-07-12 NOTE — Progress Notes (Signed)
Subjective: Patient admitted with sickle cell painful crisis.  She is known to have chronic pain syndrome and opiate dependence.  She is describing pain as 8 out of 10 in her back.  No fever or chills no nausea vomiting or diarrhea.  Patient is using her PCA  Objective: Vital signs in last 24 hours: Temp:  [97.6 F (36.4 C)-98.3 F (36.8 C)] 97.7 F (36.5 C) (09/11 0951) Pulse Rate:  [69-95] 83 (09/11 0951) Resp:  [11-15] 11 (09/11 0951) BP: (104-136)/(74-89) 121/79 (09/11 0951) SpO2:  [93 %-100 %] 95 % (09/11 0951) Weight change:  Last BM Date: 07/09/20  Intake/Output from previous day: No intake/output data recorded. Intake/Output this shift: No intake/output data recorded.  General appearance: alert, cooperative, appears stated age and no distress Head: Normocephalic, without obvious abnormality, atraumatic Eyes: conjunctivae/corneas clear. PERRL, EOM's intact. Fundi benign. Ears: normal TM's and external ear canals both ears Resp: clear to auscultation bilaterally Cardio: regular rate and rhythm, S1, S2 normal, no murmur, click, rub or gallop GI: soft, non-tender; bowel sounds normal; no masses,  no organomegaly Extremities: extremities normal, atraumatic, no cyanosis or edema Pulses: 2+ and symmetric Skin: Skin color, texture, turgor normal. No rashes or lesions Neurologic: Grossly normal  Lab Results: Recent Labs    07/10/20 1121 07/10/20 1754  WBC 7.8 6.7  HGB 11.3* 11.3*  HCT 31.9* 35.5*  PLT 513* 242   BMET Recent Labs    07/10/20 1121 07/11/20 0801  NA 137 138  K 3.7 4.3  CL 103 100  CO2 24 24  GLUCOSE 96 103*  BUN 10 16  CREATININE 0.52 0.63  CALCIUM 9.7 9.8    Studies/Results: No results found.  Medications: I have reviewed the patient's current medications.  Assessment/Plan: A 29 year old female admitted with sickle cell painful crisis.  #1 sickle cell painful crisis: Patient will be maintained on current Dilaudid PCA and home regimen.   She has been doing better on that.  Monitor pain and adjust accordingly.  Also she is on MS Contin.  Also on Toradol.  #2 sickle cell anemia: H&H is stable  #3 depression with anxiety: Continue Celexa  #4 chronic pain syndrome: Continue MS Contin   LOS: 1 day   Carmen [name],Carmen [name] 07/12/2020, 11:54 AM

## 2020-07-12 NOTE — Progress Notes (Signed)
Offered Miralax for constipation, patient declined.

## 2020-07-13 ENCOUNTER — Inpatient Hospital Stay: Payer: Self-pay

## 2020-07-13 NOTE — Progress Notes (Signed)
Subjective: Patient doing much better. Patient is using her PCA more today compared to yesterday.  Pain is down to 7 out of 10.  She lost her IV access at some point.  IV team trying to get access to the patient can continue with her PCA.  Patient was feeling depressed this morning but overall feels like she is headed in the right direction.  Objective: Vital signs in last 24 hours: Temp:  [97.5 F (36.4 C)-98.4 F (36.9 C)] 98.4 F (36.9 C) (09/12 1449) Pulse Rate:  [68-95] 87 (09/12 1449) Resp:  [12-18] 18 (09/12 1449) BP: (120-139)/(69-92) 131/77 (09/12 1449) SpO2:  [93 %-100 %] 100 % (09/12 1449) Weight change:  Last BM Date: 07/12/20  Intake/Output from previous day: 09/11 0701 - 09/12 0700 In: 2536.8 [P.O.:240; I.V.:2296.8] Out: -  Intake/Output this shift: Total I/O In: 457.8 [P.O.:240; I.V.:217.8] Out: -   General appearance: alert, cooperative, appears stated age and no distress Head: Normocephalic, without obvious abnormality, atraumatic Eyes: conjunctivae/corneas clear. PERRL, EOM's intact. Fundi benign. Ears: normal TM's and external ear canals both ears Resp: clear to auscultation bilaterally Cardio: regular rate and rhythm, S1, S2 normal, no murmur, click, rub or gallop GI: soft, non-tender; bowel sounds normal; no masses,  no organomegaly Extremities: extremities normal, atraumatic, no cyanosis or edema Pulses: 2+ and symmetric Skin: Skin color, texture, turgor normal. No rashes or lesions Neurologic: Grossly normal  Lab Results: Recent Labs    07/10/20 1754  WBC 6.7  HGB 11.3*  HCT 35.5*  PLT 242   BMET Recent Labs    07/11/20 0801  NA 138  K 4.3  CL 100  CO2 24  GLUCOSE 103*  BUN 16  CREATININE 0.63  CALCIUM 9.8    Studies/Results: [place] EKG SITE RITE  Result Date: 07/13/2020 If Site Rite image not attached, placement could not be confirmed due to current cardiac rhythm.   Medications: I have reviewed the patient's current  medications.  Assessment/Plan: A 29 year old female admitted with sickle cell painful crisis.  #1 sickle cell painful crisis: Patient will be maintained on current Dilaudid PCA with bolus dose of 0.6 mg timeout.  10 minutes and maximum 1 dose of 4 mg.  We will begin to titrate it down tomorrow.  Also on MS Contin 60 mg every 12 hours and Toradol 15 mg every 6 hours.  She has immediate oxycodone ordered.  She has been doing better on that.  Monitor pain and adjust accordingly.    #2 sickle cell anemia: H&H is stable  #3 depression with anxiety: Continue Celexa  #4 chronic pain syndrome: Continue MS Contin   LOS: 2 days   Jakhai Fant,LAWAL 07/13/2020, 5:26 PM

## 2020-07-13 NOTE — Progress Notes (Signed)
Patient educated on PCA and not to press buttons on IV pump. Patient verbalizes understanding. Norlene Duel RN, BSN

## 2020-07-13 NOTE — Progress Notes (Signed)
Rn notified that PICC will not be place today.  VAS Team RN to attempt IV/ midline this pm.

## 2020-07-14 DIAGNOSIS — D571 Sickle-cell disease without crisis: Secondary | ICD-10-CM

## 2020-07-14 DIAGNOSIS — Z79891 Long term (current) use of opiate analgesic: Secondary | ICD-10-CM

## 2020-07-14 MED ORDER — MORPHINE SULFATE ER 60 MG PO TBCR: 60.0000 mg | EXTENDED_RELEASE_TABLET | Freq: Two times a day (BID) | ORAL | 0 refills | Status: DC

## 2020-07-14 MED ORDER — AMOXICILLIN-POT CLAVULANATE 875-125 MG PO TABS: 1.0000 | ORAL_TABLET | Freq: Two times a day (BID) | ORAL | 0 refills | Status: AC

## 2020-07-14 NOTE — Discharge Instructions (Signed)
Sickle Cell Anemia, Adult  Sickle cell anemia is a condition where your red blood cells are shaped like sickles. Red blood cells carry oxygen through the body. Sickle-shaped cells do not live as long as normal red blood cells. They also clump together and block blood from flowing through the blood vessels. This prevents the body from getting enough oxygen. Sickle cell anemia causes organ damage and pain. It also increases the risk of infection. Follow these instructions at home: Medicines  Take over-the-counter and prescription medicines only as told by your doctor.  If you were prescribed an antibiotic medicine, take it as told by your doctor. Do not stop taking the antibiotic even if you start to feel better.  If you develop a fever, do not take medicines to lower the fever right away. Tell your doctor about the fever. Managing pain, stiffness, and swelling  Try these methods to help with pain: ? Use a heating pad. ? Take a warm bath. ? Distract yourself, such as by watching TV. Eating and drinking  Drink enough fluid to keep your pee (urine) clear or pale yellow. Drink more in hot weather and during exercise.  Limit or avoid alcohol.  Eat a healthy diet. Eat plenty of fruits, vegetables, whole grains, and lean protein.  Take vitamins and supplements as told by your doctor. Traveling  When traveling, keep these with you: ? Your medical information. ? The names of your doctors. ? Your medicines.  If you need to take an airplane, talk to your doctor first. Activity  Rest often.  Avoid exercises that make your heart beat much faster, such as jogging. General instructions  Do not use products that have nicotine or tobacco, such as cigarettes and e-cigarettes. If you need help quitting, ask your doctor.  Consider wearing a medical alert bracelet.  Avoid being in high places (high altitudes), such as mountains.  Avoid very hot or cold temperatures.  Avoid places where the  temperature changes a lot.  Keep all follow-up visits as told by your doctor. This is important. Contact a doctor if:  A joint hurts.  Your feet or hands hurt or swell.  You feel tired (fatigued). Get help right away if:  You have symptoms of infection. These include: ? Fever. ? Chills. ? Being very tired. ? Irritability. ? Poor eating. ? Throwing up (vomiting).  You feel dizzy or faint.  You have new stomach pain, especially on the left side.  You have a an erection (priapism) that lasts more than 4 hours.  You have numbness in your arms or legs.  You have a hard time moving your arms or legs.  You have trouble talking.  You have pain that does not go away when you take medicine.  You are short of breath.  You are breathing fast.  You have a long-term cough.  You have pain in your chest.  You have a bad headache.  You have a stiff neck.  Your stomach looks bloated even though you did not eat much.  Your skin is pale.  You suddenly cannot see well. Summary  Sickle cell anemia is a condition where your red blood cells are shaped like sickles.  Follow your doctor's advice on ways to manage pain, food to eat, activities to do, and steps to take for safe travel.  Get medical help right away if you have any signs of infection, such as a fever. This information is not intended to replace advice given to you by   your health care provider. Make sure you discuss any questions you have with your health care provider. Document Revised: 02/09/2019 Document Reviewed: 11/23/2016 Elsevier Patient Education  2020 [employer] Inc.  

## 2020-07-14 NOTE — Discharge Summary (Signed)
Physician Discharge Summary  KIMBERLEY SPEECE IFO:[id number] DOB: Jan 17, 1991 DOA: 07/10/2020  PCP: Vevelyn Francois, NP  Admit date: 07/10/2020  Discharge date: 07/14/2020  Discharge Diagnoses:  Principal Problem:   Sickle cell pain crisis ([place]) Active Problems:   Anemia of chronic disease   Chronic musculoskeletal pain   Discharge Condition: Stable  Disposition:   Follow-up Information    Vevelyn Francois, NP Follow up in 1 week(s).   Specialty: Adult Health Nurse Practitioner Contact information: [address] [name] [address] [phone]              Pt is discharged home in good condition and is to follow up with Vevelyn Francois, NP as previously scheduled to have labs evaluated. Carmen [name] is instructed to increase activity slowly and balance with rest for the next few days, and use prescribed medication to complete treatment of pain  Diet: Regular Wt Readings from Last 3 Encounters:  07/10/20 85.3 kg  07/02/20 85.7 kg  06/09/20 74.8 kg    History of present illness:  Carmen [name]  is a 29 y.o. female with a medical history significant for sickle cell disease, chronic pain syndrome, opiate dependence and tolerance, and history of anemia of chronic disease presents complaining of generalized pain that is consistent with her previous pain crisis.  She says that pain was worsened on awakening this a.m.  She characterizes pain as constant and throbbing.  Patient states that pain intensity increased 3 days ago and has been unrelieved by home medications.  Patient complains of pain throughout her entire body including upper and lower extreme remedies.  She says says that she had chest pains, which is since resolved.  Patient has not identified any aggravating or alleviating factors contained in the current crisis.  She denies any subjective fever, chills, headache, dizziness, shortness of breath, or chest pain.  No nausea, vomiting, or diarrhea.   ER Course:   Vital signs show BP 114/85    Pulse (!) 101    Temp 98.9 F (37.2 C) (Oral)    Resp 18    Ht 5\' 3"  (1.6 m)    Wt 85.3 kg    SpO2 99%    BMI 33.30 kg/m .  Patient's hemoglobin is 11.3, consistent with her baseline.  All other laboratory values largely within normal limits.  Patient's pain persists despite IV Dilaudid, IV fluids, and IV Toradol.  Sickle cell team notified for admission.  Patient admitted in observation for further management of sickle cell pain crisis.  Hospital Course:  Sickle cell disease with pain crisis:  Patient was admitted for sickle cell pain crisis and managed appropriately with IVF, IV Dilaudid via PCA and IV Toradol, as well as other adjunct therapies per sickle cell pain management protocols. IV dilaudid PCA was weaned appropriately. Patient to resume home medications. All laboratory values have remained consistent with patient's baseline throughout admission. Patient advised to follow up with PCP as scheduled to repeat CBC and CMP   Patient was therefore discharged home today in a hemodynamically stable condition.   Tritia will follow-up with PCP within 1 week of this discharge. Lety was counseled extensively about nonpharmacologic means of pain management, patient verbalized understanding and was appreciative of  the care received during this admission.   We discussed the need for good hydration, monitoring of hydration status, avoidance of heat, cold, stress, and infection triggers. We discussed the need to be adherent with taking home medications. Patient was  reminded of the need to seek medical attention immediately if any symptom of bleeding, anemia, or infection occurs.  Discharge Exam: Vitals:   07/14/20 0640 07/14/20 0801  BP:    Pulse:    Resp: 15 17  Temp:    SpO2: 96% 98%   Vitals:   07/13/20 2156 07/13/20 2338 07/14/20 0640 07/14/20 0801  BP: 116/70     Pulse: (!) 101     Resp: 20 20 15 17   Temp: 99.2 F (37.3 C)     TempSrc:      SpO2: 96% 94%  96% 98%  Weight:      Height:        General appearance : Awake, alert, not in any distress. Speech Clear. Not toxic looking HEENT: Atraumatic and Normocephalic, pupils equally reactive to light and accomodation Neck: Supple, no JVD. No cervical lymphadenopathy.  Chest: Good air entry bilaterally, no added sounds  CVS: S1 S2 regular, no murmurs.  Abdomen: Bowel sounds present, Non tender and not distended with no gaurding, rigidity or rebound. Extremities: B/L Lower Ext shows no edema, both legs are warm to touch Neurology: Awake alert, and oriented X 3, CN II-XII intact, Non focal Skin: No Rash  Discharge Instructions  Discharge Instructions    Discharge patient   Complete by: As directed    Discharge disposition: 01-Home or Self Care   Discharge patient date: 07/14/2020     Allergies as of 07/14/2020   No Known Allergies     Medication List    TAKE these medications   acetaminophen 500 MG tablet Commonly known as: TYLENOL Take 500 mg by mouth every 6 (six) hours as needed for mild pain or headache.   amoxicillin-clavulanate 875-125 MG tablet Commonly known as: Augmentin Take 1 tablet by mouth 2 (two) times daily for 10 days.   citalopram 10 MG tablet Commonly known as: CeleXA Take 1 tablet (10 mg total) by mouth daily.   folic acid 1 MG tablet Commonly known as: FOLVITE Take 1 tablet (1 mg total) by mouth daily.   loratadine 10 MG tablet Commonly known as: CLARITIN Take 1 tablet (10 mg total) by mouth daily.   Oxycodone HCl 10 MG Tabs Take 1 tablet (10 mg total) by mouth every 6 (six) hours as needed for up to 15 days (PAIN).   Prenatal Vitamin Plus Low Iron 27-1 MG Tabs TAKE 1 TABLET BY MOUTH EVERY DAY   Vitamin D (Ergocalciferol) 1.25 MG (50000 UNIT) Caps capsule Commonly known as: DRISDOL TAKE ONE CAPSULE BY MOUTH ONE TIME PER WEEK What changed: See the new instructions.     ASK your doctor about these medications   morphine 60 MG 12 hr  tablet Commonly known as: MS Contin Take 1 tablet (60 mg total) by mouth every 12 (twelve) hours. Ask about: Which instructions should I use?       The results of significant diagnostics from this hospitalization (including imaging, microbiology, ancillary and laboratory) are listed below for reference.    Significant Diagnostic Studies: DG Chest Port 1 View  Result Date: 07/10/2020 CLINICAL DATA:  Chest pain EXAM: PORTABLE CHEST 1 VIEW COMPARISON:  06/09/2020 FINDINGS: The heart size and mediastinal contours are within normal limits. Both lungs are clear. No acute osseous findings. IMPRESSION: No active disease. Electronically Signed   By: Davina Poke D.O.   On: 07/10/2020 11:34   [place] EKG SITE RITE  Result Date: 07/13/2020 If Site Rite image not attached, placement could not be confirmed  due to current cardiac rhythm.   Microbiology: Recent Results (from the past 240 hour(s))  SARS Coronavirus 2 by RT PCR (hospital order, performed in [facility] hospital lab) Nasopharyngeal Nasopharyngeal Swab     Status: None   Collection Time: 07/10/20  1:20 PM   Specimen: Nasopharyngeal Swab  Result Value Ref Range Status   SARS Coronavirus 2 NEGATIVE NEGATIVE Final    Comment: (NOTE) SARS-CoV-2 target nucleic acids are NOT DETECTED.  The SARS-CoV-2 RNA is generally detectable in upper and lower respiratory specimens during the acute phase of infection. The lowest concentration of SARS-CoV-2 viral copies this assay can detect is 250 copies / mL. A negative result does not preclude SARS-CoV-2 infection and should not be used as the sole basis for treatment or other patient management decisions.  A negative result may occur with improper specimen collection / handling, submission of specimen other than nasopharyngeal swab, presence of viral mutation(s) within the areas targeted by this assay, and inadequate number of viral copies (<250 copies / mL). A negative result must be combined with  clinical observations, patient history, and epidemiological information.  Fact Sheet for Patients:   StrictlyIdeas.no  Fact Sheet for Healthcare Providers: BankingDealers.co.za  This test is not yet approved or  cleared by the [place] FDA and has been authorized for detection and/or diagnosis of SARS-CoV-2 by FDA under an Emergency Use Authorization (EUA).  This EUA will remain in effect (meaning this test can be used) for the duration of the COVID-19 declaration under Section 564(b)(1) of the Act, 21 U.S.C. section 360bbb-3(b)(1), unless the authorization is terminated or revoked sooner.  Performed at [facility], [place] [address] [place], [place] [address]      Labs: Basic Metabolic Panel: Recent Labs  Lab 07/10/20 1121 07/11/20 0801  NA 137 138  K 3.7 4.3  CL 103 100  CO2 24 24  GLUCOSE 96 103*  BUN 10 16  CREATININE 0.52 0.63  CALCIUM 9.7 9.8   Liver Function Tests: Recent Labs  Lab 07/10/20 1121  AST 37  ALT 46*  ALKPHOS 70  BILITOT 1.1  PROT 9.1*  ALBUMIN 4.8   No results for input(s): LIPASE, AMYLASE in the last 168 hours. No results for input(s): AMMONIA in the last 168 hours. CBC: Recent Labs  Lab 07/10/20 1121 07/10/20 1754  WBC 7.8 6.7  NEUTROABS 5.2  --   HGB 11.3* 11.3*  HCT 31.9* 35.5*  MCV 82.2 84.1  PLT 513* 242   Cardiac Enzymes: No results for input(s): CKTOTAL, CKMB, CKMBINDEX, TROPONINI in the last 168 hours. BNP: Invalid input(s): POCBNP CBG: No results for input(s): GLUCAP in the last 168 hours.  Time coordinating discharge: 35 minutes  Signed: [name]  APRN, MSN, FNP-C [facility] [address] [place], [place] [address] [phone]  [facility] 07/14/2020, 4:13 PM

## 2020-07-18 ENCOUNTER — Telehealth: Payer: Self-pay | Admitting: Nurse Practitioner

## 2020-07-18 NOTE — Telephone Encounter (Signed)
error 

## 2020-07-22 ENCOUNTER — Other Ambulatory Visit: Payer: Self-pay

## 2020-07-22 DIAGNOSIS — G894 Chronic pain syndrome: Secondary | ICD-10-CM

## 2020-07-22 DIAGNOSIS — D571 Sickle-cell disease without crisis: Secondary | ICD-10-CM

## 2020-07-22 DIAGNOSIS — Z79891 Long term (current) use of opiate analgesic: Secondary | ICD-10-CM

## 2020-07-24 ENCOUNTER — Other Ambulatory Visit: Payer: Self-pay

## 2020-07-24 DIAGNOSIS — F3341 Major depressive disorder, recurrent, in partial remission: Secondary | ICD-10-CM

## 2020-07-24 MED ORDER — CITALOPRAM HYDROBROMIDE 10 MG PO TABS: 10.0000 mg | ORAL_TABLET | Freq: Every day | ORAL | 2 refills | Status: DC

## 2020-07-25 ENCOUNTER — Other Ambulatory Visit: Payer: Self-pay

## 2020-07-25 DIAGNOSIS — D571 Sickle-cell disease without crisis: Secondary | ICD-10-CM

## 2020-07-25 DIAGNOSIS — Z79891 Long term (current) use of opiate analgesic: Secondary | ICD-10-CM

## 2020-07-25 DIAGNOSIS — G894 Chronic pain syndrome: Secondary | ICD-10-CM

## 2020-07-28 ENCOUNTER — Telehealth: Payer: Self-pay | Admitting: Nurse Practitioner

## 2020-07-28 ENCOUNTER — Other Ambulatory Visit: Payer: Self-pay | Admitting: Nurse Practitioner

## 2020-07-28 DIAGNOSIS — Z79891 Long term (current) use of opiate analgesic: Secondary | ICD-10-CM

## 2020-07-28 DIAGNOSIS — D571 Sickle-cell disease without crisis: Secondary | ICD-10-CM

## 2020-07-28 DIAGNOSIS — G894 Chronic pain syndrome: Secondary | ICD-10-CM

## 2020-07-28 MED ORDER — OXYCODONE HCL 10 MG PO TABS: 10.0000 mg | ORAL_TABLET | Freq: Four times a day (QID) | ORAL | 0 refills | Status: DC | PRN

## 2020-07-28 NOTE — Telephone Encounter (Signed)
Sent!

## 2020-08-07 ENCOUNTER — Other Ambulatory Visit: Payer: Self-pay | Admitting: Nurse Practitioner

## 2020-08-07 DIAGNOSIS — G8929 Other chronic pain: Secondary | ICD-10-CM

## 2020-08-10 ENCOUNTER — Other Ambulatory Visit: Payer: Self-pay

## 2020-08-10 DIAGNOSIS — D571 Sickle-cell disease without crisis: Secondary | ICD-10-CM

## 2020-08-10 DIAGNOSIS — G894 Chronic pain syndrome: Secondary | ICD-10-CM

## 2020-08-10 DIAGNOSIS — Z79891 Long term (current) use of opiate analgesic: Secondary | ICD-10-CM

## 2020-08-11 ENCOUNTER — Other Ambulatory Visit: Payer: Self-pay | Admitting: Nurse Practitioner

## 2020-08-11 ENCOUNTER — Telehealth: Payer: Self-pay | Admitting: Nurse Practitioner

## 2020-08-11 DIAGNOSIS — D571 Sickle-cell disease without crisis: Secondary | ICD-10-CM

## 2020-08-11 DIAGNOSIS — G894 Chronic pain syndrome: Secondary | ICD-10-CM

## 2020-08-11 DIAGNOSIS — Z79891 Long term (current) use of opiate analgesic: Secondary | ICD-10-CM

## 2020-08-11 MED ORDER — OXYCODONE HCL 10 MG PO TABS: 10.0000 mg | ORAL_TABLET | Freq: Four times a day (QID) | ORAL | 0 refills | Status: DC | PRN

## 2020-08-11 MED ORDER — MORPHINE SULFATE ER 60 MG PO TBCR: 60.0000 mg | EXTENDED_RELEASE_TABLET | Freq: Two times a day (BID) | ORAL | 0 refills | Status: DC

## 2020-08-11 NOTE — Telephone Encounter (Signed)
These have been sent 

## 2020-08-21 ENCOUNTER — Ambulatory Visit: Payer: Self-pay | Admitting: Nurse Practitioner

## 2020-08-26 ENCOUNTER — Other Ambulatory Visit: Payer: Self-pay

## 2020-08-26 DIAGNOSIS — D571 Sickle-cell disease without crisis: Secondary | ICD-10-CM

## 2020-08-26 DIAGNOSIS — G894 Chronic pain syndrome: Secondary | ICD-10-CM

## 2020-08-26 DIAGNOSIS — Z79891 Long term (current) use of opiate analgesic: Secondary | ICD-10-CM

## 2020-08-27 NOTE — Telephone Encounter (Signed)
Please review  Thank you

## 2020-08-29 ENCOUNTER — Other Ambulatory Visit: Payer: Self-pay | Admitting: Nurse Practitioner

## 2020-08-29 MED ORDER — OXYCODONE HCL 10 MG PO TABS: 10.0000 mg | ORAL_TABLET | Freq: Four times a day (QID) | ORAL | 0 refills | Status: DC | PRN

## 2020-08-29 NOTE — Telephone Encounter (Signed)
Pt was called and she stated her f/u visit is next month.

## 2020-09-01 NOTE — Telephone Encounter (Signed)
Patient medication refill is not approriate. Patient refill is due on 09/11/2020

## 2020-09-03 ENCOUNTER — Encounter: Payer: Self-pay | Admitting: Nurse Practitioner

## 2020-09-03 ENCOUNTER — Telehealth (INDEPENDENT_AMBULATORY_CARE_PROVIDER_SITE_OTHER): Payer: Medicaid Other | Admitting: Nurse Practitioner

## 2020-09-03 VITALS — Ht 63.0 in | Wt 180.0 lb

## 2020-09-03 DIAGNOSIS — E559 Vitamin D deficiency, unspecified: Secondary | ICD-10-CM | POA: Diagnosis not present

## 2020-09-03 DIAGNOSIS — F331 Major depressive disorder, recurrent, moderate: Secondary | ICD-10-CM

## 2020-09-03 DIAGNOSIS — D571 Sickle-cell disease without crisis: Secondary | ICD-10-CM | POA: Diagnosis not present

## 2020-09-03 DIAGNOSIS — Z79891 Long term (current) use of opiate analgesic: Secondary | ICD-10-CM | POA: Diagnosis not present

## 2020-09-03 DIAGNOSIS — J301 Allergic rhinitis due to pollen: Secondary | ICD-10-CM

## 2020-09-03 MED ORDER — VITAMIN D (ERGOCALCIFEROL) 1.25 MG (50000 UNIT) PO CAPS: ORAL_CAPSULE | ORAL | 1 refills | Status: DC

## 2020-09-03 NOTE — Progress Notes (Signed)
Virtual Visit via Telephone Note  I connected with Carmen [name] on 09/04/20 at 11:20 AM EDT by telephone and verified that I am speaking with the correct person using two identifiers.   I discussed the limitations, risks, security and privacy concerns of performing an evaluation and management service by telephone and the availability of in person appointments. I also discussed with the patient that there may be a patient responsible charge related to this service. The patient expressed understanding and agreed to proceed.  Patient home Provider Office  CC:  Chief Complaint  Patient presents with  . Follow-up    HPI Carmen [name] presents for follow up. She  has a past medical history of Anxiety, Headache(784.0), Heart murmur, Sickle cell crisis ([place]), Syphilis (2015), and Thrombocytosis (11/22/2014).  . Depression Patient complains of depression. She complains of depressed mood. Onset was approximately several months ago. Symptoms have been gradually improving since that time. Current symptoms include: depressed mood. Patient denies anhedonia, difficulty concentrating, hopelessness, psychomotor agitation, psychomotor retardation, recurrent thoughts of death, suicidal attempt, suicidal thoughts with specific plan and suicidal thoughts without plan. Family history significant for depression. Risk factors: previous episode of depression. Previous treatment includes individual therapy and medication. She complains of the following side effects from the treatment: none. She feels like she is doing well overall with her depression. She knows that she has to take her medicine as prescribed. When she does not have her medication on board she desires to stay in bed and her mood shifts.  Denies fever, headache, cough, wheezing, shortness of breath, chest pains, abdominal pain, back pain, hip pain, or leg pain. Denies any open wounds, skin irritation. Are you actively exercising she walks the  neighborhood with her son. She tolerates this without difficulty.    Past Medical History:  Diagnosis Date  . Anxiety   . Headache(784.0)   . Heart murmur   . Sickle cell crisis ([place])   . Syphilis 2015   Was diagnosed and received one injection of antibiotics  . Thrombocytosis 11/22/2014     CBC Latest Ref Rng & Units 12/13/2018 12/11/2018 12/10/2018 WBC 4.0 - 10.5 K/uL 14.8(H) 13.2(H) 15.9(H) Hemoglobin 12.0 - 15.0 g/dL 8.2(L) 7.7(L) 8.1(L) Hematocrit 36.0 - 46.0 % 23.7(L) 23.2(L) 24.5(L) Platelets 150 - 400 K/uL 326 399 388      Past Surgical History:  Procedure Laterality Date  . CESAREAN SECTION N/A 02/05/2019   Procedure: CESAREAN SECTION;  Surgeon: [name], MD;  Location: [facility] LD ORS;  Service: Obstetrics;  Laterality: N/A;  . CHOLECYSTECTOMY N/A 11/30/2014   Procedure: LAPAROSCOPIC CHOLECYSTECTOMY SINGLE SITE WITH INTRAOPERATIVE CHOLANGIOGRAM;  Surgeon: Michael Boston, MD;  Location: [facility] ORS;  Service: General;  Laterality: N/A;  . SPLENECTOMY      Family History  Problem Relation Age of Onset  . Hypertension Mother   . Sickle cell anemia Sister   . Kidney disease Sister        Lupus  . Arthritis Sister   . Sickle cell anemia Sister   . Sickle cell trait Sister   . Heart disease Maternal Aunt        CABG  . Heart disease Maternal Uncle        CABG  . Lupus Sister     Social History   Socioeconomic History  . Marital status: Single    Spouse name: Not on file  . Number of children: Not on file  . Years of education: Not on file  .  Highest education level: Not on file  Occupational History  . Not on file  Tobacco Use  . Smoking status: Current Some Day Smoker    Types: Cigarettes  . Smokeless tobacco: Never Used  Vaping Use  . Vaping Use: Never used  Substance and Sexual Activity  . Alcohol use: No  . Drug use: No  . Sexual activity: Yes    Birth control/protection: Injection  Other Topics Concern  . Not on file  Social History Narrative  . Not on file    Social Determinants of Health   Financial Resource Strain:   . Difficulty of Paying Living Expenses: Not on file  Food Insecurity:   . Worried About Charity fundraiser in the Last Year: Not on file  . Ran Out of Food in the Last Year: Not on file  Transportation Needs:   . Lack of Transportation (Medical): Not on file  . Lack of Transportation (Non-Medical): Not on file  Physical Activity:   . Days of Exercise per Week: Not on file  . Minutes of Exercise per Session: Not on file  Stress:   . Feeling of Stress : Not on file  Social Connections:   . Frequency of Communication with Friends and Family: Not on file  . Frequency of Social Gatherings with Friends and Family: Not on file  . Attends Religious Services: Not on file  . Active Member of Clubs or Organizations: Not on file  . Attends Archivist Meetings: Not on file  . Marital Status: Not on file  Intimate Partner Violence:   . Fear of Current or Ex-Partner: Not on file  . Emotionally Abused: Not on file  . Physically Abused: Not on file  . Sexually Abused: Not on file    Outpatient Medications Prior to Visit  Medication Sig Dispense Refill  . acetaminophen (TYLENOL) 500 MG tablet Take 500 mg by mouth every 6 (six) hours as needed for mild pain or headache.    . citalopram (CELEXA) 10 MG tablet Take 1 tablet (10 mg total) by mouth daily. 30 tablet 2  . cyclobenzaprine (FLEXERIL) 10 MG tablet TAKE 1 TABLET BY MOUTH TWICE A DAY 60 tablet 2  . folic acid (FOLVITE) 1 MG tablet Take 1 tablet (1 mg total) by mouth daily. 90 tablet 3  . loratadine (CLARITIN) 10 MG tablet Take 1 tablet (10 mg total) by mouth daily. 30 tablet 11  . morphine (MS CONTIN) 60 MG 12 hr tablet Take 1 tablet (60 mg total) by mouth every 12 (twelve) hours. 60 tablet 0  . Oxycodone HCl 10 MG TABS Take 1 tablet (10 mg total) by mouth every 6 (six) hours as needed. 60 tablet 0  . Prenatal Vit-Fe Fumarate-FA (PRENATAL VITAMIN PLUS LOW IRON) 27-1  MG TABS TAKE 1 TABLET BY MOUTH EVERY DAY (Patient taking differently: Take 1 tablet by mouth daily. ) 30 tablet 13  . Vitamin D, Ergocalciferol, (DRISDOL) 1.25 MG (50000 UT) CAPS capsule TAKE ONE CAPSULE BY MOUTH ONE TIME PER WEEK (Patient taking differently: Take 50,000 Units by mouth every 7 (seven) days. ) 30 capsule 1  . ASPIRIN LOW DOSE 81 MG EC tablet Take 81 mg by mouth daily.     No facility-administered medications prior to visit.    No Known Allergies  ROS Review of Systems  Constitutional: Negative.   HENT: Negative.   Eyes: Negative.   Respiratory: Negative for shortness of breath.   Cardiovascular: Negative for chest pain.  Gastrointestinal: Positive for nausea (morning nausea. Improving with eating regular diet). Negative for constipation and diarrhea.  Endocrine: Negative.   Genitourinary: Negative.   Skin: Negative.   Neurological: Negative for dizziness and light-headedness.  Hematological: Negative.   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 Move frequently to reduce venous thromboembolism risk. Avoid situations that could lead to dehydration or could exacerbate pain Discussed S&S of infection, seizures, stroke acute chest, DVT and how important it is to seek medical attention Take medication as directed along with pain contract and overall compliance Discussed the risk related to opiate use (addition, tolerance and dependency)      Other Visit Diagnoses    Seasonal allergic rhinitis due to pollen       Moderate episode of recurrent major depressive disorder ([place])     Encourage patient to continue with Celexa at current dose due to  medication being effective.  Encourage compliance.  Continue to encourage patient to do activities with her son.  Getting out walking is good therapy and exercise.  Encourage patient continue to rely on family for assistance when needed      Meds ordered this encounter  Medications  . Vitamin D, Ergocalciferol, (DRISDOL) 1.25 MG (50000 UNIT) CAPS capsule    Sig: TAKE ONE CAPSULE BY MOUTH ONE TIME PER WEEK    Dispense:  30 capsule    Refill:  1    Order Specific Question:   Supervising Provider    Answer:   Tresa Garter [[id number]]  I discussed the assessment and treatment plan with the patient. The patient was provided an opportunity to ask questions and all were answered. The patient agreed with the plan and demonstrated an understanding of the instructions.   The patient was advised to call back or seek an in-person evaluation if the symptoms worsen or if the condition fails to improve as anticipated.  I provided 12 minutes of non-face-to-face time during this encounter.  Follow-up: Return in about 2 months (around 11/10/2020).    Vevelyn Francois, NP

## 2020-09-04 ENCOUNTER — Encounter: Payer: Self-pay | Admitting: Nurse Practitioner

## 2020-09-08 ENCOUNTER — Inpatient Hospital Stay (HOSPITAL_COMMUNITY)
Admission: AD | Admit: 2020-09-08 | Discharge: 2020-09-11 | DRG: 812 | Discharge status: Against Medical Advice | Payer: Medicaid Other | Source: Ambulatory Visit | Attending: Internal Medicine | Admitting: Internal Medicine

## 2020-09-08 ENCOUNTER — Other Ambulatory Visit: Payer: Self-pay

## 2020-09-08 ENCOUNTER — Telehealth (HOSPITAL_COMMUNITY): Payer: Self-pay | Admitting: *Deleted

## 2020-09-08 ENCOUNTER — Encounter (HOSPITAL_COMMUNITY): Payer: Self-pay | Admitting: Family Medicine

## 2020-09-08 DIAGNOSIS — M7918 Myalgia, other site: Secondary | ICD-10-CM | POA: Diagnosis not present

## 2020-09-08 DIAGNOSIS — Z79899 Other long term (current) drug therapy: Secondary | ICD-10-CM

## 2020-09-08 DIAGNOSIS — Z20822 Contact with and (suspected) exposure to covid-19: Secondary | ICD-10-CM | POA: Diagnosis present

## 2020-09-08 DIAGNOSIS — Z7982 Long term (current) use of aspirin: Secondary | ICD-10-CM

## 2020-09-08 DIAGNOSIS — G8929 Other chronic pain: Secondary | ICD-10-CM

## 2020-09-08 DIAGNOSIS — Z832 Family history of diseases of the blood and blood-forming organs and certain disorders involving the immune mechanism: Secondary | ICD-10-CM

## 2020-09-08 DIAGNOSIS — G894 Chronic pain syndrome: Secondary | ICD-10-CM | POA: Diagnosis present

## 2020-09-08 DIAGNOSIS — Z5329 Procedure and treatment not carried out because of patient's decision for other reasons: Secondary | ICD-10-CM | POA: Diagnosis present

## 2020-09-08 DIAGNOSIS — F17210 Nicotine dependence, cigarettes, uncomplicated: Secondary | ICD-10-CM | POA: Diagnosis present

## 2020-09-08 DIAGNOSIS — D5700 Hb-SS disease with crisis, unspecified: Principal | ICD-10-CM

## 2020-09-08 DIAGNOSIS — F1120 Opioid dependence, uncomplicated: Secondary | ICD-10-CM | POA: Diagnosis present

## 2020-09-08 LAB — COMPREHENSIVE METABOLIC PANEL
ALT: 25 U/L (ref 0–44)
AST: 35 U/L (ref 15–41)
Albumin: 4.3 g/dL (ref 3.5–5.0)
Alkaline Phosphatase: 65 U/L (ref 38–126)
Anion gap: 7 (ref 5–15)
BUN: 11 mg/dL (ref 6–20)
CO2: 27 mmol/L (ref 22–32)
Calcium: 8.8 mg/dL — ABNORMAL LOW (ref 8.9–10.3)
Chloride: 107 mmol/L (ref 98–111)
Creatinine, Ser: 0.54 mg/dL (ref 0.44–1.00)
GFR, Estimated: >60 mL/min (ref >60)
Glucose, Bld: 91 mg/dL (ref 70–99)
Potassium: 4.3 mmol/L (ref 3.5–5.1)
Sodium: 141 mmol/L (ref 135–145)
Total Bilirubin: 0.7 mg/dL (ref 0.3–1.2)
Total Protein: 8 g/dL (ref 6.5–8.1)

## 2020-09-08 LAB — RESPIRATORY PANEL BY RT PCR (FLU A&B, COVID)
Influenza A by PCR: NEGATIVE
Influenza B by PCR: NEGATIVE
SARS Coronavirus 2 by RT PCR: NEGATIVE

## 2020-09-08 LAB — RAPID URINE DRUG SCREEN, HOSP PERFORMED
Amphetamines: NOT DETECTED
Barbiturates: NOT DETECTED
Benzodiazepines: NOT DETECTED
Cocaine: NOT DETECTED
Opiates: POSITIVE — AB
Tetrahydrocannabinol: POSITIVE — AB

## 2020-09-08 LAB — CBC WITH DIFFERENTIAL/PLATELET
Abs Immature Granulocytes: 0.03 10*3/uL (ref 0.00–0.07)
Basophils Absolute: 0.1 10*3/uL (ref 0.0–0.1)
Basophils Relative: 1 %
Eosinophils Absolute: 0.2 10*3/uL (ref 0.0–0.5)
Eosinophils Relative: 2 %
HCT: 27.9 % — ABNORMAL LOW (ref 36.0–46.0)
Hemoglobin: 9.8 g/dL — ABNORMAL LOW (ref 12.0–15.0)
Immature Granulocytes: 0 %
Lymphocytes Relative: 51 %
Lymphs Abs: 4.5 10*3/uL — ABNORMAL HIGH (ref 0.7–4.0)
MCH: 29.4 pg (ref 26.0–34.0)
MCHC: 35.1 g/dL (ref 30.0–36.0)
MCV: 83.8 fL (ref 80.0–100.0)
Monocytes Absolute: 0.9 10*3/uL (ref 0.1–1.0)
Monocytes Relative: 10 %
Neutro Abs: 3.1 10*3/uL (ref 1.7–7.7)
Neutrophils Relative %: 36 %
Platelets: 529 10*3/uL — ABNORMAL HIGH (ref 150–400)
RBC: 3.33 MIL/uL — ABNORMAL LOW (ref 3.87–5.11)
RDW: 14.4 % (ref 11.5–15.5)
WBC: 8.8 10*3/uL (ref 4.0–10.5)
nRBC: 0.5 % — ABNORMAL HIGH (ref 0.0–0.2)

## 2020-09-08 LAB — RETICULOCYTES
Immature Retic Fract: 30.8 % — ABNORMAL HIGH (ref 2.3–15.9)
RBC.: 3.36 MIL/uL — ABNORMAL LOW (ref 3.87–5.11)
Retic Count, Absolute: 196.6 10*3/uL — ABNORMAL HIGH (ref 19.0–186.0)
Retic Ct Pct: 5.9 % — ABNORMAL HIGH (ref 0.4–3.1)

## 2020-09-08 LAB — URINALYSIS, ROUTINE W REFLEX MICROSCOPIC
Bilirubin Urine: NEGATIVE
Glucose, UA: NEGATIVE mg/dL
Hgb urine dipstick: NEGATIVE
Ketones, ur: NEGATIVE mg/dL
Leukocytes,Ua: NEGATIVE
Nitrite: NEGATIVE
Protein, ur: NEGATIVE mg/dL
Specific Gravity, Urine: 1.013 (ref 1.005–1.030)
pH: 6 (ref 5.0–8.0)

## 2020-09-08 LAB — PREGNANCY, URINE: Preg Test, Ur: NEGATIVE

## 2020-09-08 MED ORDER — ENOXAPARIN SODIUM 40 MG/0.4ML ~~LOC~~ SOLN
40.0000 mg | SUBCUTANEOUS | Status: DC
  Administered 2020-09-08: 40 mg via SUBCUTANEOUS
  Filled 2020-09-08 (×3): qty 0.4

## 2020-09-08 MED ORDER — ACETAMINOPHEN 325 MG PO TABS
325.0000 mg | ORAL_TABLET | Freq: Once | ORAL | Status: AC
  Administered 2020-09-08: 325 mg via ORAL
  Filled 2020-09-08: qty 1

## 2020-09-08 MED ORDER — NALOXONE HCL 0.4 MG/ML IJ SOLN: 0.4000 mg | INTRAMUSCULAR | Status: DC | PRN

## 2020-09-08 MED ORDER — HYDROMORPHONE 1 MG/ML IV SOLN
INTRAVENOUS | Status: DC
  Administered 2020-09-08: 10.5 mg via INTRAVENOUS
  Administered 2020-09-08: 7 mg via INTRAVENOUS
  Administered 2020-09-08: 30 mg via INTRAVENOUS
  Administered 2020-09-09 (×2): 5.5 mg via INTRAVENOUS
  Administered 2020-09-09: 7.5 mg via INTRAVENOUS
  Administered 2020-09-09: 7 mg via INTRAVENOUS
  Filled 2020-09-08: qty 25
  Filled 2020-09-08: qty 30

## 2020-09-08 MED ORDER — MORPHINE SULFATE ER 30 MG PO TBCR
60.0000 mg | EXTENDED_RELEASE_TABLET | Freq: Two times a day (BID) | ORAL | Status: DC
  Administered 2020-09-08 – 2020-09-11 (×6): 60 mg via ORAL
  Filled 2020-09-08 (×6): qty 2

## 2020-09-08 MED ORDER — CITALOPRAM HYDROBROMIDE 20 MG PO TABS
10.0000 mg | ORAL_TABLET | Freq: Every day | ORAL | Status: DC
  Administered 2020-09-09 – 2020-09-11 (×3): 10 mg via ORAL
  Filled 2020-09-08 (×3): qty 1

## 2020-09-08 MED ORDER — KETOROLAC TROMETHAMINE 15 MG/ML IJ SOLN
15.0000 mg | Freq: Four times a day (QID) | INTRAMUSCULAR | Status: DC
  Administered 2020-09-08 – 2020-09-11 (×12): 15 mg via INTRAVENOUS
  Filled 2020-09-08 (×12): qty 1

## 2020-09-08 MED ORDER — FOLIC ACID 1 MG PO TABS
1.0000 mg | ORAL_TABLET | Freq: Every day | ORAL | Status: DC
  Administered 2020-09-09 – 2020-09-11 (×3): 1 mg via ORAL
  Filled 2020-09-08 (×3): qty 1

## 2020-09-08 MED ORDER — ASPIRIN EC 81 MG PO TBEC
81.0000 mg | DELAYED_RELEASE_TABLET | Freq: Every day | ORAL | Status: DC
  Administered 2020-09-09 – 2020-09-11 (×3): 81 mg via ORAL
  Filled 2020-09-08 (×3): qty 1

## 2020-09-08 MED ORDER — KETOROLAC TROMETHAMINE 30 MG/ML IJ SOLN
15.0000 mg | Freq: Once | INTRAMUSCULAR | Status: AC
  Administered 2020-09-08: 15 mg via INTRAVENOUS
  Filled 2020-09-08: qty 1

## 2020-09-08 MED ORDER — SODIUM CHLORIDE 0.45 % IV SOLN: INTRAVENOUS | Status: DC

## 2020-09-08 MED ORDER — CYCLOBENZAPRINE HCL 10 MG PO TABS
10.0000 mg | ORAL_TABLET | Freq: Two times a day (BID) | ORAL | Status: DC
  Administered 2020-09-08 – 2020-09-11 (×6): 10 mg via ORAL
  Filled 2020-09-08 (×6): qty 1

## 2020-09-08 MED ORDER — POLYETHYLENE GLYCOL 3350 17 G PO PACK: 17.0000 g | PACK | Freq: Every day | ORAL | Status: DC | PRN

## 2020-09-08 MED ORDER — SODIUM CHLORIDE 0.9% FLUSH: 9.0000 mL | INTRAVENOUS | Status: DC | PRN

## 2020-09-08 MED ORDER — DIPHENHYDRAMINE HCL 25 MG PO CAPS
25.0000 mg | ORAL_CAPSULE | ORAL | Status: DC | PRN
  Administered 2020-09-08: 25 mg via ORAL
  Filled 2020-09-08: qty 1

## 2020-09-08 MED ORDER — SENNOSIDES-DOCUSATE SODIUM 8.6-50 MG PO TABS
1.0000 | ORAL_TABLET | Freq: Two times a day (BID) | ORAL | Status: DC
  Administered 2020-09-08 – 2020-09-11 (×6): 1 via ORAL
  Filled 2020-09-08 (×6): qty 1

## 2020-09-08 MED ORDER — ONDANSETRON HCL 4 MG/2ML IJ SOLN
4.0000 mg | Freq: Four times a day (QID) | INTRAMUSCULAR | Status: DC | PRN
  Administered 2020-09-08 – 2020-09-09 (×2): 4 mg via INTRAVENOUS
  Filled 2020-09-08 (×2): qty 2

## 2020-09-08 NOTE — Discharge Instructions (Signed)
Sickle Cell Anemia, Adult  Sickle cell anemia is a condition where your red blood cells are shaped like sickles. Red blood cells carry oxygen through the body. Sickle-shaped cells do not live as long as normal red blood cells. They also clump together and block blood from flowing through the blood vessels. This prevents the body from getting enough oxygen. Sickle cell anemia causes organ damage and pain. It also increases the risk of infection. Follow these instructions at home: Medicines  Take over-the-counter and prescription medicines only as told by your doctor.  If you were prescribed an antibiotic medicine, take it as told by your doctor. Do not stop taking the antibiotic even if you start to feel better.  If you develop a fever, do not take medicines to lower the fever right away. Tell your doctor about the fever. Managing pain, stiffness, and swelling  Try these methods to help with pain: ? Use a heating pad. ? Take a warm bath. ? Distract yourself, such as by watching TV. Eating and drinking  Drink enough fluid to keep your pee (urine) clear or pale yellow. Drink more in hot weather and during exercise.  Limit or avoid alcohol.  Eat a healthy diet. Eat plenty of fruits, vegetables, whole grains, and lean protein.  Take vitamins and supplements as told by your doctor. Traveling  When traveling, keep these with you: ? Your medical information. ? The names of your doctors. ? Your medicines.  If you need to take an airplane, talk to your doctor first. Activity  Rest often.  Avoid exercises that make your heart beat much faster, such as jogging. General instructions  Do not use products that have nicotine or tobacco, such as cigarettes and e-cigarettes. If you need help quitting, ask your doctor.  Consider wearing a medical alert bracelet.  Avoid being in high places (high altitudes), such as mountains.  Avoid very hot or cold temperatures.  Avoid places where the  temperature changes a lot.  Keep all follow-up visits as told by your doctor. This is important. Contact a doctor if:  A joint hurts.  Your feet or hands hurt or swell.  You feel tired (fatigued). Get help right away if:  You have symptoms of infection. These include: ? Fever. ? Chills. ? Being very tired. ? Irritability. ? Poor eating. ? Throwing up (vomiting).  You feel dizzy or faint.  You have new stomach pain, especially on the left side.  You have a an erection (priapism) that lasts more than 4 hours.  You have numbness in your arms or legs.  You have a hard time moving your arms or legs.  You have trouble talking.  You have pain that does not go away when you take medicine.  You are short of breath.  You are breathing fast.  You have a long-term cough.  You have pain in your chest.  You have a bad headache.  You have a stiff neck.  Your stomach looks bloated even though you did not eat much.  Your skin is pale.  You suddenly cannot see well. Summary  Sickle cell anemia is a condition where your red blood cells are shaped like sickles.  Follow your doctor's advice on ways to manage pain, food to eat, activities to do, and steps to take for safe travel.  Get medical help right away if you have any signs of infection, such as a fever. This information is not intended to replace advice given to you by   your health care provider. Make sure you discuss any questions you have with your health care provider. Document Revised: 02/09/2019 Document Reviewed: 11/23/2016 Elsevier Patient Education  2020 [employer] Inc.  

## 2020-09-08 NOTE — H&P (Signed)
[facility] History and Physical   Date: 09/08/2020  Patient name: Carmen [name] Medical record number: [id number] Date of birth: Feb 17, 1991 Age: 29 y.o. Gender: female PCP: Vevelyn Francois, NP  Attending physician: Tresa Garter, MD  Chief Complaint: Sickle cell pain crisis  History of Present Illness: Carmen [name] is a 29 year old female with a medical history significant for sickle cell disease, chronic pain syndrome, opiate dependence and tolerance, and history of anemia of chronic disease presents complaining of generalized pain that is consistent with her typical pain crisis.  She attributes current pain crisis to activities and cold weather.  Pain intensity is 8/10 characterized as constant and throbbing.  She last had oxycodone this a.m. without sustained relief.  She denies any fever, chills, headache, urinary symptoms, nausea, vomiting, or diarrhea.  No sick contacts, recent travel, or exposure to COVID-19.  Meds: Medications Prior to Admission  Medication Sig Dispense Refill Last Dose  . acetaminophen (TYLENOL) 500 MG tablet Take 500 mg by mouth every 6 (six) hours as needed for mild pain or headache.     . ASPIRIN LOW DOSE 81 MG EC tablet Take 81 mg by mouth daily.     . citalopram (CELEXA) 10 MG tablet Take 1 tablet (10 mg total) by mouth daily. 30 tablet 2   . cyclobenzaprine (FLEXERIL) 10 MG tablet TAKE 1 TABLET BY MOUTH TWICE A DAY 60 tablet 2   . folic acid (FOLVITE) 1 MG tablet Take 1 tablet (1 mg total) by mouth daily. 90 tablet 3   . loratadine (CLARITIN) 10 MG tablet Take 1 tablet (10 mg total) by mouth daily. 30 tablet 11   . morphine (MS CONTIN) 60 MG 12 hr tablet Take 1 tablet (60 mg total) by mouth every 12 (twelve) hours. 60 tablet 0   . Oxycodone HCl 10 MG TABS Take 1 tablet (10 mg total) by mouth every 6 (six) hours as needed. 60 tablet 0   . Prenatal Vit-Fe Fumarate-FA (PRENATAL VITAMIN PLUS LOW IRON) 27-1 MG TABS TAKE 1 TABLET BY MOUTH  EVERY DAY (Patient taking differently: Take 1 tablet by mouth daily. ) 30 tablet 13   . Vitamin D, Ergocalciferol, (DRISDOL) 1.25 MG (50000 UNIT) CAPS capsule TAKE ONE CAPSULE BY MOUTH ONE TIME PER WEEK 30 capsule 1     Allergies: Patient has no known allergies. Past Medical History:  Diagnosis Date  . Anxiety   . Headache(784.0)   . Heart murmur   . Sickle cell crisis ([place])   . Syphilis 2015   Was diagnosed and received one injection of antibiotics  . Thrombocytosis 11/22/2014     CBC Latest Ref Rng & Units 12/13/2018 12/11/2018 12/10/2018 WBC 4.0 - 10.5 K/uL 14.8(H) 13.2(H) 15.9(H) Hemoglobin 12.0 - 15.0 g/dL 8.2(L) 7.7(L) 8.1(L) Hematocrit 36.0 - 46.0 % 23.7(L) 23.2(L) 24.5(L) Platelets 150 - 400 K/uL [phone]     Past Surgical History:  Procedure Laterality Date  . CESAREAN SECTION N/A 02/05/2019   Procedure: CESAREAN SECTION;  Surgeon: [name], MD;  Location: [facility] LD ORS;  Service: Obstetrics;  Laterality: N/A;  . CHOLECYSTECTOMY N/A 11/30/2014   Procedure: LAPAROSCOPIC CHOLECYSTECTOMY SINGLE SITE WITH INTRAOPERATIVE CHOLANGIOGRAM;  Surgeon: Michael Boston, MD;  Location: [facility] ORS;  Service: General;  Laterality: N/A;  . SPLENECTOMY     Family History  Problem Relation Age of Onset  . Hypertension Mother   . Sickle cell anemia Sister   . Kidney disease Sister  Lupus  . Arthritis Sister   . Sickle cell anemia Sister   . Sickle cell trait Sister   . Heart disease Maternal Aunt        CABG  . Heart disease Maternal Uncle        CABG  . Lupus Sister    Social History   Socioeconomic History  . Marital status: Single    Spouse name: Not on file  . Number of children: Not on file  . Years of education: Not on file  . Highest education level: Not on file  Occupational History  . Not on file  Tobacco Use  . Smoking status: Current Some Day Smoker    Types: Cigarettes  . Smokeless tobacco: Never Used  Vaping Use  . Vaping Use: Never used  Substance and Sexual  Activity  . Alcohol use: No  . Drug use: No  . Sexual activity: Yes    Birth control/protection: Injection  Other Topics Concern  . Not on file  Social History Narrative  . Not on file   Social Determinants of Health   Financial Resource Strain:   . Difficulty of Paying Living Expenses: Not on file  Food Insecurity:   . Worried About Charity fundraiser in the Last Year: Not on file  . Ran Out of Food in the Last Year: Not on file  Transportation Needs:   . Lack of Transportation (Medical): Not on file  . Lack of Transportation (Non-Medical): Not on file  Physical Activity:   . Days of Exercise per Week: Not on file  . Minutes of Exercise per Session: Not on file  Stress:   . Feeling of Stress : Not on file  Social Connections:   . Frequency of Communication with Friends and Family: Not on file  . Frequency of Social Gatherings with Friends and Family: Not on file  . Attends Religious Services: Not on file  . Active Member of Clubs or Organizations: Not on file  . Attends Archivist Meetings: Not on file  . Marital Status: Not on file  Intimate Partner Violence:   . Fear of Current or Ex-Partner: Not on file  . Emotionally Abused: Not on file  . Physically Abused: Not on file  . Sexually Abused: Not on file   Review of Systems  Constitutional: Negative for chills and fever.  HENT: Negative.   Eyes: Negative.   Respiratory: Negative.   Cardiovascular: Negative.   Gastrointestinal: Negative.   Genitourinary: Negative.   Musculoskeletal: Positive for back pain and joint pain.  Skin: Negative.   Neurological: Negative.   Psychiatric/Behavioral: Negative.    Physical Exam: Last menstrual period 08/27/2020, not currently breastfeeding. Physical Exam Constitutional:      Appearance: Normal appearance. She is obese.  Eyes:     Pupils: Pupils are equal, round, and reactive to light.  Cardiovascular:     Rate and Rhythm: Normal rate and regular rhythm.      Pulses: Normal pulses.  Pulmonary:     Effort: Pulmonary effort is normal.  Abdominal:     General: Bowel sounds are normal.  Musculoskeletal:        General: Normal range of motion.  Skin:    General: Skin is warm.  Neurological:     General: No focal deficit present.     Mental Status: She is alert. Mental status is at baseline.  Psychiatric:        Mood and Affect: Mood normal.  Thought Content: Thought content normal.        Judgment: Judgment normal.     Lab results: No results found for this or any previous visit (from the past 24 hour(s)).  Imaging results:  No results found.   Assessment & Plan: Patient admitted to sickle cell day infusion center for management of pain crisis.  Patient is opiate tolerant Initiate IV dilaudid PCA. Settings of 0.5 mg, 10 minute lockout and 3 mg/hr IV fluids, 0.45% saline at 100 ml/hr Toradol 15 mg IV times one dose Tylenol 1000 mg by mouth times one dose Review CBC with differential, complete metabolic panel, and reticulocytes as results become available. Baseline hemoglobin is 10-11 g/dL Pain intensity will be reevaluated in context of functioning and relationship to baseline as care progress If pain intensity remains elevated and/or sudden change in hemodynamic stability transition to inpatient services for higher level of care.     [name]  APRN, MSN, FNP-C Patient [facility] [address] [place], [place] [address] [phone]  09/08/2020, 10:00 AM

## 2020-09-08 NOTE — H&P (Signed)
H&P  Patient Demographics:  Carmen [name], is a 29 y.o. female  MRN: [id number]   DOB - 07-30-1991  Admit Date - 09/08/2020  Outpatient Primary MD for the patient is Vevelyn Francois, NP      HPI:   Carmen [name] is a 29 year old female with a medical history significant for sickle cell disease, chronic pain syndrome, opiate dependence and tolerance, and history of anemia of chronic disease presents complaining of generalized pain that is consistent with her typical pain crisis.  She attributes current pain crisis to activities and cold weather.  Pain intensity is 8/10 characterized as constant and throbbing.  She last had oxycodone this a.m. without sustained relief.  She denies any fever, chills, headache, urinary symptoms, nausea, vomiting, or diarrhea.  No sick contacts, recent travel, or exposure to COVID-19.  Sickle cell day clinic course:  Current vital signs show: BP 117/81 (BP Location: Left Arm)   Pulse 60   Temp 98.3 F (36.8 C) (Oral)   Resp (!) 21   LMP 08/27/2020 (Within Days)   SpO2 100% .  Hemoglobin 9.8, appears to be consistent with patient's baseline.  All other laboratory values largely within normal range and unremarkable.  Patient's pain persists despite IV Dilaudid PCA, IV fluids, and IV Toradol.  Admit to [place] and observation for further management of sickle cell pain crisis.   Review of systems:  In addition to the HPI above, patient reports No fever or chills No Headache, No changes with vision or hearing No problems swallowing food or liquids No chest pain, cough or shortness of breath No abdominal pain, No nausea or vomiting, Bowel movements are regular No blood in stool or urine No dysuria No new skin rashes or bruises No new joints pains-aches No new weakness, tingling, numbness in any extremity No recent weight gain or loss No polyuria, polydypsia or polyphagia No significant Mental Stressors   With Past History of the following :   Past Medical  History:  Diagnosis Date  . Anxiety   . Headache(784.0)   . Heart murmur   . Sickle cell crisis ([place])   . Syphilis 2015   Was diagnosed and received one injection of antibiotics  . Thrombocytosis 11/22/2014     CBC Latest Ref Rng & Units 12/13/2018 12/11/2018 12/10/2018 WBC 4.0 - 10.5 K/uL 14.8(H) 13.2(H) 15.9(H) Hemoglobin 12.0 - 15.0 g/dL 8.2(L) 7.7(L) 8.1(L) Hematocrit 36.0 - 46.0 % 23.7(L) 23.2(L) 24.5(L) Platelets 150 - 400 K/uL 326 399 388        Past Surgical History:  Procedure Laterality Date  . CESAREAN SECTION N/A 02/05/2019   Procedure: CESAREAN SECTION;  Surgeon: [name], MD;  Location: [facility]S;  Service: Obstetrics;  Laterality: N/A;  . CHOLECYSTECTOMY N/A 11/30/2014   Procedure: LAPAROSCOPIC CHOLECYSTECTOMY SINGLE SITE WITH INTRAOPERATIVE CHOLANGIOGRAM;  Surgeon: Michael Boston, MD;  Location: [facility] ORS;  Service: General;  Laterality: N/A;  . SPLENECTOMY       Social History:   Social History   Tobacco Use  . Smoking status: Current Some Day Smoker    Types: Cigarettes  . Smokeless tobacco: Never Used  Substance Use Topics  . Alcohol use: No     Lives - At home   Family History :   Family History  Problem Relation Age of Onset  . Hypertension Mother   . Sickle cell anemia Sister   . Kidney disease Sister        Lupus  . Arthritis Sister   .  Sickle cell anemia Sister   . Sickle cell trait Sister   . Heart disease Maternal Aunt        CABG  . Heart disease Maternal Uncle        CABG  . Lupus Sister      Home Medications:   Prior to Admission medications   Medication Sig Start Date End Date Taking? Authorizing Provider  acetaminophen (TYLENOL) 500 MG tablet Take 500 mg by mouth every 6 (six) hours as needed for mild pain or headache.   Yes [provider]  ASPIRIN LOW DOSE 81 MG EC tablet Take 81 mg by mouth daily. 08/01/20  Yes [provider]  citalopram (CELEXA) 10 MG tablet Take 1 tablet (10 mg total) by mouth daily. 07/24/20  10/22/20 Yes Vevelyn Francois, NP  cyclobenzaprine (FLEXERIL) 10 MG tablet TAKE 1 TABLET BY MOUTH TWICE A DAY 08/07/20  Yes Vevelyn Francois, NP  folic acid (FOLVITE) 1 MG tablet Take 1 tablet (1 mg total) by mouth daily. 03/15/19  Yes Lanae Boast, FNP  morphine (MS CONTIN) 60 MG 12 hr tablet Take 1 tablet (60 mg total) by mouth every 12 (twelve) hours. 08/12/20 09/11/20 Yes Vevelyn Francois, NP  Oxycodone HCl 10 MG TABS Take 1 tablet (10 mg total) by mouth every 6 (six) hours as needed. 08/29/20 09/28/20 Yes Vevelyn Francois, NP  Prenatal Vit-Fe Fumarate-FA (PRENATAL VITAMIN PLUS LOW IRON) 27-1 MG TABS TAKE 1 TABLET BY MOUTH EVERY DAY Patient taking differently: Take 1 tablet by mouth daily.  03/03/20  Yes Shelly Bombard, MD  loratadine (CLARITIN) 10 MG tablet Take 1 tablet (10 mg total) by mouth daily. 03/28/20   Vevelyn Francois, NP  Vitamin D, Ergocalciferol, (DRISDOL) 1.25 MG (50000 UNIT) CAPS capsule TAKE ONE CAPSULE BY MOUTH ONE TIME PER WEEK 09/03/20   Vevelyn Francois, NP     Allergies:   No Known Allergies   Physical Exam:   Vitals:   Vitals:   09/08/20 1400 09/08/20 1410  BP: 130/90 131/80  Pulse: 76   Resp: 16   Temp: 98.3 F (36.8 C)   SpO2: 95%     Physical Exam: Constitutional: Patient appears well-developed and well-nourished. Not in obvious distress. HENT: Normocephalic, atraumatic, External right and left ear normal. Oropharynx is clear and moist.  Eyes: Conjunctivae and EOM are normal. PERRLA, no scleral icterus. Neck: Normal ROM. Neck supple. No JVD. No tracheal deviation. No thyromegaly. CVS: RRR, S1/S2 +, no murmurs, no gallops, no carotid bruit.  Pulmonary: Effort and breath sounds normal, no stridor, rhonchi, wheezes, rales.  Abdominal: Soft. BS +, no distension, tenderness, rebound or guarding.  Musculoskeletal: Normal range of motion. No edema and no tenderness.  Lymphadenopathy: No lymphadenopathy noted, cervical, inguinal or axillary Neuro: Alert. Normal  reflexes, muscle tone coordination. No cranial nerve deficit. Skin: Skin is warm and dry. No rash noted. Not diaphoretic. No erythema. No pallor. Psychiatric: Normal mood and affect. Behavior, judgment, thought content normal.   Data Review:   CBC Recent Labs  Lab 09/08/20 1010  WBC 8.8  HGB 9.8*  HCT 27.9*  PLT 529*  MCV 83.8  MCH 29.4  MCHC 35.1  RDW 14.4  LYMPHSABS 4.5*  MONOABS 0.9  EOSABS 0.2  BASOSABS 0.1   ------------------------------------------------------------------------------------------------------------------  Chemistries  Recent Labs  Lab 09/08/20 1010  NA 141  K 4.3  CL 107  CO2 27  GLUCOSE 91  BUN 11  CREATININE 0.54  CALCIUM 8.8*  AST 35  ALT 25  ALKPHOS 65  BILITOT 0.7   ------------------------------------------------------------------------------------------------------------------ estimated creatinine clearance is 106.9 mL/min (by C-G formula based on SCr of 0.54 mg/dL). ------------------------------------------------------------------------------------------------------------------ No results for input(s): TSH, T4TOTAL, T3FREE, THYROIDAB in the last 72 hours.  Invalid input(s): FREET3  Coagulation profile No results for input(s): INR, PROTIME in the last 168 hours. ------------------------------------------------------------------------------------------------------------------- No results for input(s): DDIMER in the last 72 hours. -------------------------------------------------------------------------------------------------------------------  Cardiac Enzymes No results for input(s): CKMB, TROPONINI, MYOGLOBIN in the last 168 hours.  Invalid input(s): CK ------------------------------------------------------------------------------------------------------------------ No results found for: BNP  ---------------------------------------------------------------------------------------------------------------  Urinalysis     Component Value Date/Time   COLORURINE YELLOW 12/31/2019 1213   APPEARANCEUR CLEAR 12/31/2019 1213   LABSPEC 1.011 12/31/2019 1213   PHURINE 6.0 12/31/2019 1213   GLUCOSEU NEGATIVE 12/31/2019 1213   HGBUR NEGATIVE 12/31/2019 [place] 12/31/2019 1213   BILIRUBINUR Negative 08/10/2019 1534   KETONESUR NEGATIVE 12/31/2019 1213   PROTEINUR NEGATIVE 12/31/2019 1213   UROBILINOGEN 1.0 08/10/2019 1534   UROBILINOGEN 1.0 01/12/2018 0943   NITRITE Negative 07/02/2020 1309   NITRITE NEGATIVE 12/31/2019 1213   LEUKOCYTESUR Negative 07/02/2020 1309   LEUKOCYTESUR SMALL (A) 12/31/2019 1213    ----------------------------------------------------------------------------------------------------------------   Imaging Results:    No results found.   Assessment & Plan:  Principal Problem:   Sickle cell pain crisis (HCC) Active Problems:   Chronic musculoskeletal pain   Sickle cell disease with pain crisis: Admit patient, continue IV Dilaudid PCA with settings of 0.5 mg, 10-minute lockout, and 3 mg/h. Continue IV fluids, 0.45% saline at 100 mL/h. Toradol 15 mg IV every 6 hours Restart home medications, MS Contin 30 mg every 12 hours Reevaluate pain scale regularly, monitor vital signs closely, and supplemental oxygen as needed. Patient will be reevaluated for pain in the context of function and relationship to baseline as care progresses.  Sickle cell anemia: Hemoglobin 9.7, consistent with patient's baseline.  There is no clinical indication for blood transfusion at this time.  Continue to follow closely.  CBC in a.m.  Chronic pain syndrome: Continue MS Contin 30 mg every 12 hours.  Hold oxycodone, use PCA substitute.   DVT Prophylaxis: Subcut Lovenox and SCDs  AM Labs Ordered, also please review Full Orders  Family Communication: Admission, patient's condition and plan of care including tests being ordered have been discussed with the patient who indicate  understanding and agree with the plan and Code Status.  Code Status: Full Code  Consults called: None    Admission status: Inpatient    Time spent in minutes : 20minutes  [place], MSN, FNP-C Patient [facility] [address] [place], [place] [address] [phone]  09/08/2020 at 3:42 PM

## 2020-09-08 NOTE — Progress Notes (Addendum)
Pt admitted to the day hospital for treatment of sickle cell pain crisis. Pt reported pain to bilateral shoulder and back rating pain a 9/10. Pt given PO Benadryl and Tylenol,placed on Dilaudid PCA, and hydrated with IV fluids. Pt reports pain not improving on PCA. [place] FNP notified, and orders placed for hospital admission. Pt made aware and verbalized understanding. Report given to [facility].  Pt taken in wheelchair by staff RN to [address] bed 40. Pt reports pain an 8/10 and stable at transfer.

## 2020-09-08 NOTE — BH Specialist Note (Signed)
Integrated Behavioral Health General Follow Up Note  09/08/2020 Name: Carmen [name] MRN: [id number] DOB: 05/14/1991 Carmen [name] is a 29 y.o. year old female who sees Vevelyn Francois, NP for primary care. LCSW was initially consulted to assess mental health needs and assist the patient with Mental Health Counseling and Resources.  Interpreter: No.   Interpreter Name & Language: none  Assessment: Patient experiencing challenges related to chronic illness.  Ongoing Intervention: Today CSW met briefly with patient at bedside in the day hospital. Patient cancelled and did not reschedule IBH session scheduled in September. Today patient stated she would like to reschedule and wants to talk about challenges with working while living with illness and in finding work she is interested in. Noted patient will be admitted to [facility]. Planned to follow up with patient there tomorrow.   Review of patient status, including review of consultants reports, relevant laboratory and other test results, and collaboration with appropriate care team members and the patient's provider was performed as part of comprehensive patient evaluation and provision of services.     Follow up Plan: 1. Follow up in [facility] tomorrow.   Estanislado Emms, [facility] [phone]

## 2020-09-08 NOTE — Telephone Encounter (Signed)
Patient called requesting to come to the day hospital for sickle cell pain. Patient reports back and bilateral shoulder pain rated 9/10. Reports taking Oxycodone 10 mg at 5:00 am. COVID-19 screening done and patient denies all symptoms and exposures. Denies fever, chest pain, nausea, vomiting, diarrhea and abdominal pain. Admits to having transportation without driving self. [place], [place] notified. Patient can come to the day hospital for pain management. Patient advised and expresses an understanding.

## 2020-09-09 DIAGNOSIS — G894 Chronic pain syndrome: Secondary | ICD-10-CM | POA: Diagnosis present

## 2020-09-09 DIAGNOSIS — Z5329 Procedure and treatment not carried out because of patient's decision for other reasons: Secondary | ICD-10-CM | POA: Diagnosis present

## 2020-09-09 DIAGNOSIS — D5700 Hb-SS disease with crisis, unspecified: Secondary | ICD-10-CM | POA: Diagnosis present

## 2020-09-09 DIAGNOSIS — M7918 Myalgia, other site: Secondary | ICD-10-CM | POA: Diagnosis not present

## 2020-09-09 DIAGNOSIS — G8929 Other chronic pain: Secondary | ICD-10-CM | POA: Diagnosis not present

## 2020-09-09 DIAGNOSIS — Z20822 Contact with and (suspected) exposure to covid-19: Secondary | ICD-10-CM | POA: Diagnosis present

## 2020-09-09 DIAGNOSIS — Z79899 Other long term (current) drug therapy: Secondary | ICD-10-CM | POA: Diagnosis not present

## 2020-09-09 DIAGNOSIS — F1120 Opioid dependence, uncomplicated: Secondary | ICD-10-CM | POA: Diagnosis present

## 2020-09-09 DIAGNOSIS — Z832 Family history of diseases of the blood and blood-forming organs and certain disorders involving the immune mechanism: Secondary | ICD-10-CM | POA: Diagnosis not present

## 2020-09-09 DIAGNOSIS — Z7982 Long term (current) use of aspirin: Secondary | ICD-10-CM | POA: Diagnosis not present

## 2020-09-09 DIAGNOSIS — F17210 Nicotine dependence, cigarettes, uncomplicated: Secondary | ICD-10-CM | POA: Diagnosis present

## 2020-09-09 MED ORDER — OXYCODONE HCL 5 MG PO TABS
10.0000 mg | ORAL_TABLET | ORAL | Status: DC | PRN
  Administered 2020-09-09 – 2020-09-11 (×5): 10 mg via ORAL
  Filled 2020-09-09 (×5): qty 2

## 2020-09-09 MED ORDER — HYDROMORPHONE 1 MG/ML IV SOLN
INTRAVENOUS | Status: DC
  Administered 2020-09-09: 30 mg via INTRAVENOUS
  Administered 2020-09-09: 9.5 mg via INTRAVENOUS
  Administered 2020-09-09: 9 mg via INTRAVENOUS
  Administered 2020-09-09: 3.5 mg via INTRAVENOUS
  Administered 2020-09-10 (×2): 2 mg via INTRAVENOUS
  Administered 2020-09-10: 7.5 mg via INTRAVENOUS
  Filled 2020-09-09: qty 30

## 2020-09-09 NOTE — Plan of Care (Signed)

## 2020-09-09 NOTE — Progress Notes (Signed)
Subjective: Carmen [name] is a 29 year old female with a medical history significant for sickle cell disease, chronic pain syndrome, opiate dependence and tolerance and history of anemia of chronic disease was admitted for sickle cell pain crisis.  Patient states that pain has not improved overnight.  Pain is primarily to low back and lower extremities.  Pain is characterized as constant and throbbing.  Patient denies any headache, chest pain, urinary symptoms, nausea, vomiting, or diarrhea.  Objective:  Vital signs in last 24 hours:  Vitals:   09/09/20 0354 09/09/20 0436 09/09/20 0800 09/09/20 0801  BP:  (!) 102/91 (!) 117/97   Pulse:  65 75   Resp: 13 15 (!) 22 20  Temp:  98 F (36.7 C) 97.7 F (36.5 C)   TempSrc:  Oral Oral   SpO2: 97% 97% 97% 98%    Intake/Output from previous day:   Intake/Output Summary (Last 24 hours) at 09/09/2020 1357 Last data filed at 09/09/2020 0801 Gross per 24 hour  Intake 1937.44 ml  Output --  Net 1937.44 ml    Physical Exam: General: Alert, awake, oriented x3, in no acute distress.  HEENT: /AT PEERL, EOMI Neck: Trachea midline,  no masses, no thyromegal,y no JVD, no carotid bruit OROPHARYNX:  Moist, No exudate/ erythema/lesions.  Heart: Regular rate and rhythm, without murmurs, rubs, gallops, PMI non-displaced, no heaves or thrills on palpation.  Lungs: Clear to auscultation, no wheezing or rhonchi noted. No increased vocal fremitus resonant to percussion  Abdomen: Soft, nontender, nondistended, positive bowel sounds, no masses no hepatosplenomegaly noted..  Neuro: No focal neurological deficits noted cranial nerves II through XII grossly intact. DTRs 2+ bilaterally upper and lower extremities. Strength 5 out of 5 in bilateral upper and lower extremities. Musculoskeletal: No warm swelling or erythema around joints, no spinal tenderness noted. Psychiatric: Patient alert and oriented x3, good insight and cognition, good recent to remote  recall. Lymph node survey: No cervical axillary or inguinal lymphadenopathy noted.  Lab Results:  Basic Metabolic Panel:    Component Value Date/Time   NA 141 09/08/2020 1010   NA 142 07/02/2020 1040   K 4.3 09/08/2020 1010   CL 107 09/08/2020 1010   CO2 27 09/08/2020 1010   BUN 11 09/08/2020 1010   BUN 8 07/02/2020 1040   CREATININE 0.54 09/08/2020 1010   GLUCOSE 91 09/08/2020 1010   CALCIUM 8.8 (L) 09/08/2020 1010   CBC:    Component Value Date/Time   WBC 8.8 09/08/2020 1010   HGB 9.8 (L) 09/08/2020 1010   HGB 9.8 (L) 07/02/2020 1040   HCT 27.9 (L) 09/08/2020 1010   HCT 28.9 (L) 07/02/2020 1040   PLT 529 (H) 09/08/2020 1010   PLT 628 (H) 07/02/2020 1040   MCV 83.8 09/08/2020 1010   MCV 88 07/02/2020 1040   NEUTROABS 3.1 09/08/2020 1010   NEUTROABS 3.0 07/02/2020 1040   LYMPHSABS 4.5 (H) 09/08/2020 1010   LYMPHSABS 3.5 (H) 07/02/2020 1040   MONOABS 0.9 09/08/2020 1010   EOSABS 0.2 09/08/2020 1010   EOSABS 0.2 07/02/2020 1040   BASOSABS 0.1 09/08/2020 1010   BASOSABS 0.1 07/02/2020 1040    Recent Results (from the past 240 hour(s))  Respiratory Panel by RT PCR (Flu A&B, Covid) - Nasopharyngeal Swab     Status: None   Collection Time: 09/08/20  6:50 PM   Specimen: Nasopharyngeal Swab  Result Value Ref Range Status   SARS Coronavirus 2 by RT PCR NEGATIVE NEGATIVE Final    Comment: (NOTE) SARS-CoV-2  target nucleic acids are NOT DETECTED.  The SARS-CoV-2 RNA is generally detectable in upper respiratoy specimens during the acute phase of infection. The lowest concentration of SARS-CoV-2 viral copies this assay can detect is 131 copies/mL. A negative result does not preclude SARS-Cov-2 infection and should not be used as the sole basis for treatment or other patient management decisions. A negative result may occur with  improper specimen collection/handling, submission of specimen other than nasopharyngeal swab, presence of viral mutation(s) within the areas  targeted by this assay, and inadequate number of viral copies (<131 copies/mL). A negative result must be combined with clinical observations, patient history, and epidemiological information. The expected result is Negative.  Fact Sheet for Patients:  PinkCheek.be  Fact Sheet for Healthcare Providers:  GravelBags.it  This test is no t yet approved or cleared by the [place] FDA and  has been authorized for detection and/or diagnosis of SARS-CoV-2 by FDA under an Emergency Use Authorization (EUA). This EUA will remain  in effect (meaning this test can be used) for the duration of the COVID-19 declaration under Section 564(b)(1) of the Act, 21 U.S.C. section 360bbb-3(b)(1), unless the authorization is terminated or revoked sooner.     Influenza A by PCR NEGATIVE NEGATIVE Final   Influenza B by PCR NEGATIVE NEGATIVE Final    Comment: (NOTE) The Xpert Xpress SARS-CoV-2/FLU/RSV assay is intended as an aid in  the diagnosis of influenza from Nasopharyngeal swab specimens and  should not be used as a sole basis for treatment. Nasal washings and  aspirates are unacceptable for Xpert Xpress SARS-CoV-2/FLU/RSV  testing.  Fact Sheet for Patients: PinkCheek.be  Fact Sheet for Healthcare Providers: GravelBags.it  This test is not yet approved or cleared by the [place] FDA and  has been authorized for detection and/or diagnosis of SARS-CoV-2 by  FDA under an Emergency Use Authorization (EUA). This EUA will remain  in effect (meaning this test can be used) for the duration of the  Covid-19 declaration under Section 564(b)(1) of the Act, 21  U.S.C. section 360bbb-3(b)(1), unless the authorization is  terminated or revoked. Performed at [facility], [place] [address] [place], [place] [address]     Studies/Results: No results  found.  Medications: Scheduled Meds: . aspirin EC  81 mg Oral Daily  . citalopram  10 mg Oral Daily  . cyclobenzaprine  10 mg Oral BID  . enoxaparin (LOVENOX) injection  40 mg Subcutaneous Q24H  . folic acid  1 mg Oral Daily  . HYDROmorphone   Intravenous Q4H  . ketorolac  15 mg Intravenous Q6H  . morphine  60 mg Oral Q12H  . senna-docusate  1 tablet Oral BID   Continuous Infusions: . sodium chloride 100 mL/hr at 09/09/20 0504   PRN Meds:.diphenhydrAMINE, naloxone **AND** sodium chloride flush, ondansetron (ZOFRAN) IV, oxyCODONE, polyethylene glycol  Consultants:  None  Procedures:  None  Antibiotics:  None  Assessment/Plan: Principal Problem:   Sickle cell pain crisis (HCC) Active Problems:   Chronic musculoskeletal pain   Sickle cell disease with pain crisis: Continue IV Dilaudid PCA, settings decreased to 0.5 mg, 10-minute lockout, and 2 mg/h. Toradol 15 mg IV every 6 hours x5 days Oxycodone 10 mg every 4 hours as needed for severe breakthrough pain Monitor vital signs closely, reevaluate pain scale regularly, and supplemental oxygen as needed.  Chronic pain syndrome: Continue home medications  Sickle cell anemia: Hemoglobin stable and consistent with patient's baseline.  There is no clinical indication for blood  transfusion at this time.  Continue to follow closely.  Repeat CBC in a.m.    Code Status: Full Code Family Communication: N/A Disposition Plan: Not yet ready for discharge   [place], MSN, FNP-C [facility] [address] [place], [place] [address] [phone]  If 5PM-8AM, please contact night-coverage.  09/09/2020, 1:57 PM  LOS: 0 days

## 2020-09-09 NOTE — BH Specialist Note (Signed)
Integrated Behavioral Health General Follow Up Note  09/09/2020 Name: Carmen [name] MRN: [id number] DOB: Mar 20, 1991 Carmen [name] is a 29 y.o. year old female who sees Vevelyn Francois, NP for primary care. LCSW was initially consulted to assess mental health needs and assist the patient with Mental Health Counseling and Resources.  Interpreter: No.   Interpreter Name & Language: none  Assessment: Patient experiencing challenges related to living with chronic illness.  Ongoing Intervention: Today CSW met briefly with patient at bedside in [place]. Patient not feeling well and feeling too tired for session. Brief support provided. Will continue to follow outpatient.  Review of patient status, including review of consultants reports, relevant laboratory and other test results, and collaboration with appropriate care team members and the patient's provider was performed as part of comprehensive patient evaluation and provision of services.     Follow up Plan: 1. CSW available from clinic as needed.   Estanislado Emms, [facility] [phone]

## 2020-09-10 ENCOUNTER — Telehealth: Payer: Self-pay | Admitting: Nurse Practitioner

## 2020-09-10 MED ORDER — HYDROMORPHONE 1 MG/ML IV SOLN
INTRAVENOUS | Status: DC
  Administered 2020-09-10: 5.5 mg via INTRAVENOUS
  Administered 2020-09-10: 30 mg via INTRAVENOUS
  Administered 2020-09-10 – 2020-09-11 (×2): 5.5 mg via INTRAVENOUS
  Administered 2020-09-11: 4.5 mg via INTRAVENOUS
  Filled 2020-09-10 (×2): qty 30

## 2020-09-10 NOTE — Progress Notes (Signed)
Subjective: Carmen [name] is a 29 year old female with a medical history significant for sickle cell disease, chronic pain syndrome, opiate dependence and tolerance, and history crisis. Patient states that pain is improving despite.  Pain is primarily to low back and lower extremities.  She denies any headache, chest pain, urinary symptoms, nausea, vomiting, or diarrhea.  Objective:  Vital signs in last 24 hours:  Vitals:   09/10/20 0737 09/10/20 0836 09/10/20 1119 09/10/20 1143  BP:  121/75    Pulse:  79    Resp: 20 (!) 22 18   Temp:  98.1 F (36.7 C)    TempSrc:  Oral    SpO2:  94% 95%   Weight:    81.6 kg  Height:    5\' 3"  (1.6 m)    Intake/Output from previous day:   Intake/Output Summary (Last 24 hours) at 09/10/2020 1309 Last data filed at 09/10/2020 0902 Gross per 24 hour  Intake 708 ml  Output --  Net 708 ml    Physical Exam: General: Alert, awake, oriented x3, in no acute distress.  HEENT: [place]/AT PEERL, EOMI Neck: Trachea midline,  no masses, no thyromegal,y no JVD, no carotid bruit OROPHARYNX:  Moist, No exudate/ erythema/lesions.  Heart: Regular rate and rhythm, without murmurs, rubs, gallops, PMI non-displaced, no heaves or thrills on palpation.  Lungs: Clear to auscultation, no wheezing or rhonchi noted. No increased vocal fremitus resonant to percussion  Abdomen: Soft, nontender, nondistended, positive bowel sounds, no masses no hepatosplenomegaly noted..  Neuro: No focal neurological deficits noted cranial nerves II through XII grossly intact. DTRs 2+ bilaterally upper and lower extremities. Strength 5 out of 5 in bilateral upper and lower extremities. Musculoskeletal: No warm swelling or erythema around joints, no spinal tenderness noted. Psychiatric: Patient alert and oriented x3, good insight and cognition, good recent to remote recall. Lymph node survey: No cervical axillary or inguinal lymphadenopathy noted.  Lab Results:  Basic Metabolic Panel:     Component Value Date/Time   NA 141 09/08/2020 1010   NA 142 07/02/2020 1040   K 4.3 09/08/2020 1010   CL 107 09/08/2020 1010   CO2 27 09/08/2020 1010   BUN 11 09/08/2020 1010   BUN 8 07/02/2020 1040   CREATININE 0.54 09/08/2020 1010   GLUCOSE 91 09/08/2020 1010   CALCIUM 8.8 (L) 09/08/2020 1010   CBC:    Component Value Date/Time   WBC 8.8 09/08/2020 1010   HGB 9.8 (L) 09/08/2020 1010   HGB 9.8 (L) 07/02/2020 1040   HCT 27.9 (L) 09/08/2020 1010   HCT 28.9 (L) 07/02/2020 1040   PLT 529 (H) 09/08/2020 1010   PLT 628 (H) 07/02/2020 1040   MCV 83.8 09/08/2020 1010   MCV 88 07/02/2020 1040   NEUTROABS 3.1 09/08/2020 1010   NEUTROABS 3.0 07/02/2020 1040   LYMPHSABS 4.5 (H) 09/08/2020 1010   LYMPHSABS 3.5 (H) 07/02/2020 1040   MONOABS 0.9 09/08/2020 1010   EOSABS 0.2 09/08/2020 1010   EOSABS 0.2 07/02/2020 1040   BASOSABS 0.1 09/08/2020 1010   BASOSABS 0.1 07/02/2020 1040    Recent Results (from the past 240 hour(s))  Respiratory Panel by RT PCR (Flu A&B, Covid) - Nasopharyngeal Swab     Status: None   Collection Time: 09/08/20  6:50 PM   Specimen: Nasopharyngeal Swab  Result Value Ref Range Status   SARS Coronavirus 2 by RT PCR NEGATIVE NEGATIVE Final    Comment: (NOTE) SARS-CoV-2 target nucleic acids are NOT DETECTED.  The SARS-CoV-2 RNA  is generally detectable in upper respiratoy specimens during the acute phase of infection. The lowest concentration of SARS-CoV-2 viral copies this assay can detect is 131 copies/mL. A negative result does not preclude SARS-Cov-2 infection and should not be used as the sole basis for treatment or other patient management decisions. A negative result may occur with  improper specimen collection/handling, submission of specimen other than nasopharyngeal swab, presence of viral mutation(s) within the areas targeted by this assay, and inadequate number of viral copies (<131 copies/mL). A negative result must be combined with  clinical observations, patient history, and epidemiological information. The expected result is Negative.  Fact Sheet for Patients:  PinkCheek.be  Fact Sheet for Healthcare Providers:  GravelBags.it  This test is no t yet approved or cleared by the [place] FDA and  has been authorized for detection and/or diagnosis of SARS-CoV-2 by FDA under an Emergency Use Authorization (EUA). This EUA will remain  in effect (meaning this test can be used) for the duration of the COVID-19 declaration under Section 564(b)(1) of the Act, 21 U.S.C. section 360bbb-3(b)(1), unless the authorization is terminated or revoked sooner.     Influenza A by PCR NEGATIVE NEGATIVE Final   Influenza B by PCR NEGATIVE NEGATIVE Final    Comment: (NOTE) The Xpert Xpress SARS-CoV-2/FLU/RSV assay is intended as an aid in  the diagnosis of influenza from Nasopharyngeal swab specimens and  should not be used as a sole basis for treatment. Nasal washings and  aspirates are unacceptable for Xpert Xpress SARS-CoV-2/FLU/RSV  testing.  Fact Sheet for Patients: PinkCheek.be  Fact Sheet for Healthcare Providers: GravelBags.it  This test is not yet approved or cleared by the [place] FDA and  has been authorized for detection and/or diagnosis of SARS-CoV-2 by  FDA under an Emergency Use Authorization (EUA). This EUA will remain  in effect (meaning this test can be used) for the duration of the  Covid-19 declaration under Section 564(b)(1) of the Act, 21  U.S.C. section 360bbb-3(b)(1), unless the authorization is  terminated or revoked. Performed at [facility], [place] [address], [place], [place] [address]     Studies/Results: No results found.  Medications: Scheduled Meds:  aspirin EC  81 mg Oral Daily   citalopram  10 mg Oral Daily   cyclobenzaprine  10 mg Oral BID    enoxaparin (LOVENOX) injection  40 mg Subcutaneous H84O   folic acid  1 mg Oral Daily   HYDROmorphone   Intravenous Q4H   ketorolac  15 mg Intravenous Q6H   morphine  60 mg Oral Q12H   senna-docusate  1 tablet Oral BID   Continuous Infusions:  sodium chloride 100 mL/hr at 09/10/20 1212   PRN Meds:.diphenhydrAMINE, naloxone **AND** sodium chloride flush, ondansetron (ZOFRAN) IV, oxyCODONE, polyethylene glycol  Consultants:  None  Procedures:  None  Antibiotics:  None  Assessment/Plan: Principal Problem:   Sickle cell pain crisis (HCC) Active Problems:   Chronic musculoskeletal pain  Sickle cell disease with pain crisis: Continue IV Dilaudid PCA, no changes in Toradol 15 every 6 hours for 20 patient encouraged to request oxycodone 10 mg every 4 hours as needed for severe breakthrough pain Monitor vital signs closely, reevaluate pain scale regularly, and supplemental oxygen as needed.  Chronic pain syndrome: Continue home medications  Sickle cell anemia: Hemoglobin is stable and consistent with patient's medical indication for blood transfusion at this time.  Continue to follow labs closely  Code Status: Full Code Family Communication: N/A Disposition Plan:  Not yet ready for discharge  [place], MSN, FNP-C [facility]  [place], [place] [address] [phone]  If 5PM-8AM, please contact night-coverage.  09/10/2020, 1:09 PM  LOS: 1 day

## 2020-09-10 NOTE — BH Specialist Note (Signed)
Integrated Behavioral Health General Follow Up Note  09/10/2020 Name: EMREY THORNLEY MRN: [id number] DOB: 02/17/1991 SAMAYAH NOVINGER is a 29 y.o. year old female who sees Vevelyn Francois, NP for primary care. LCSW was initially consulted to assess mental health needs and assist the patient with Mental Health Counseling and Resources.  Interpreter: No.   Interpreter Name & Language: none  Assessment: Patient experiencing challenges related to living with chronic illness.  Ongoing Intervention: Today CSW met with patient at bedside in [place]. Provided supportive counseling. Brief CBT related to patient's unhelpful thoughts about self in context of illness and motherhood. Mindfulness exercise completed; patient actively engaged in mindfulness exercise.   Patient looking for work and has had difficulty find work that she enjoys and that has a schedule that is convenient for her, given her medical needs and needing to care for her son. Discussed [facility] program with patient. Patient was interested in and consented to a referral to the program. CSW sent referral.   Review of patient status, including review of consultants reports, relevant laboratory and other test results, and collaboration with appropriate care team members and the patient's provider was performed as part of comprehensive patient evaluation and provision of services.     Follow up Plan: 1. CSW available from clinic as needed.   Estanislado Emms, [facility] [phone]

## 2020-09-11 NOTE — Progress Notes (Signed)
Pump cleared; Pt has hit button 15 times; delivered 11 doses for a total of 5.5mg .

## 2020-09-11 NOTE — Progress Notes (Signed)
Patient left AMA.  [name], FNP notified.  Per patient she stated her child was in an emergency and her child needed her so she had to leave.  AMA paper was signed and this Probation officer witnessed it.  Patient walked down stairs.  Unit Director also made aware.

## 2020-09-12 ENCOUNTER — Other Ambulatory Visit: Payer: Self-pay | Admitting: Nurse Practitioner

## 2020-09-12 ENCOUNTER — Other Ambulatory Visit: Payer: Self-pay

## 2020-09-12 DIAGNOSIS — Z79891 Long term (current) use of opiate analgesic: Secondary | ICD-10-CM

## 2020-09-12 DIAGNOSIS — D571 Sickle-cell disease without crisis: Secondary | ICD-10-CM

## 2020-09-12 MED ORDER — MORPHINE SULFATE ER 60 MG PO TBCR: 60.0000 mg | EXTENDED_RELEASE_TABLET | Freq: Two times a day (BID) | ORAL | 0 refills | Status: DC

## 2020-09-12 MED ORDER — OXYCODONE HCL 10 MG PO TABS: 10.0000 mg | ORAL_TABLET | Freq: Four times a day (QID) | ORAL | 0 refills | Status: DC | PRN

## 2020-09-12 NOTE — Telephone Encounter (Signed)
sent 

## 2020-09-12 NOTE — Telephone Encounter (Signed)
Please see request. Have you already refuse/cancelled this request?

## 2020-09-18 NOTE — Discharge Summary (Signed)
Physician Discharge Summary  Carmen [name]:[id number] DOB: 02-04-1991 DOA: 09/08/2020  PCP: Vevelyn Francois, NP  Admit date: 09/08/2020  Discharge date: 09/18/2020  Discharge Diagnoses:  Principal Problem:   Sickle cell pain crisis [facility]) Active Problems:   Chronic musculoskeletal pain   Discharge Condition: Patient left AGAINST MEDICAL ADVICE  Disposition:  Left AGAINST MEDICAL ADVICE.  Diet: Regular Wt Readings from Last 3 Encounters:  09/10/20 81.6 kg  09/03/20 81.6 kg  07/10/20 85.3 kg    History of present illness:  Tensley Wery is a 29 year old female with a medical history significant for sickle cell disease, chronic pain syndrome, opiate dependence and tolerance, and history of anemia of chronic disease presents complaining of generalized pain that is consistent with her typical pain crisis. She attributes current pain crisis to activities and cold weather. Pain intensity is 8/10 characterized as constant and throbbing. She last had oxycodone this a.m. without sustained relief. She denies any fever, chills, headache, urinary symptoms, nausea, vomiting, or diarrhea. No sick contacts, recent travel, or exposure to COVID-19.  Sickle cell day clinic course:  Current vital signs show: BP 117/81 (BP Location: Left Arm)   Pulse 60   Temp 98.3 F (36.8 C) (Oral)   Resp (!) 21   LMP 08/27/2020 (Within Days)   SpO2 100% .  Hemoglobin 9.8, appears to be consistent with patient's baseline.  All other laboratory values largely within normal range and unremarkable.  Patient's pain persists despite IV Dilaudid PCA, IV fluids, and IV Toradol.  Admit to [place] and observation for further management of sickle cell pain crisis.  Hospital Course:  Sickle cell disease with pain crisis: Patient was admitted for sickle cell pain crisis and managed appropriately with IVF, IV Dilaudid via PCA and IV Toradol, as well as other adjunct therapies per sickle cell pain management protocols.   Patient was hemodynamically stable prior to leaving [place].  Patient states that she had an emergency with her child and was unable to wait for provider's assessment. On chart review, patient has scheduled appointment with PCP for medication management.  All labs were consistent with patient's baseline and largely within normal range prior to leaving [place].   Discharge Exam: Vitals:   09/11/20 0809 09/11/20 1126  BP:    Pulse:    Resp: 15 18  Temp:    SpO2: 90% 95%   Vitals:   09/11/20 0428 09/11/20 0432 09/11/20 0809 09/11/20 1126  BP:  124/69    Pulse:  80    Resp: 16 15 15 18   Temp:  98.1 F (36.7 C)    TempSrc:  Axillary    SpO2: 96% 100% 90% 95%  Weight:      Height:        Discharge Instructions   Allergies as of 09/11/2020   No Known Allergies     Medication List    ASK your doctor about these medications   acetaminophen 500 MG tablet Commonly known as: TYLENOL Take 500 mg by mouth every 6 (six) hours as needed for mild pain or headache.   Aspirin Low Dose 81 MG EC tablet Generic drug: aspirin Take 81 mg by mouth daily.   citalopram 10 MG tablet Commonly known as: CeleXA Take 1 tablet (10 mg total) by mouth daily.   cyclobenzaprine 10 MG tablet Commonly known as: FLEXERIL TAKE 1 TABLET BY MOUTH TWICE A DAY   folic acid 1 MG tablet Commonly known as: FOLVITE Take  1 tablet (1 mg total) by mouth daily.   loratadine 10 MG tablet Commonly known as: CLARITIN Take 1 tablet (10 mg total) by mouth daily.   Prenatal Vitamin Plus Low Iron 27-1 MG Tabs TAKE 1 TABLET BY MOUTH EVERY DAY   Vitamin D (Ergocalciferol) 1.25 MG (50000 UNIT) Caps capsule Commonly known as: DRISDOL TAKE ONE CAPSULE BY MOUTH ONE TIME PER WEEK       The results of significant diagnostics from this hospitalization (including imaging, microbiology, ancillary and laboratory) are listed below for reference.    Significant Diagnostic Studies: No  results found.  Microbiology: Recent Results (from the past 240 hour(s))  Respiratory Panel by RT PCR (Flu A&B, Covid) - Nasopharyngeal Swab     Status: None   Collection Time: 09/08/20  6:50 PM   Specimen: Nasopharyngeal Swab  Result Value Ref Range Status   SARS Coronavirus 2 by RT PCR NEGATIVE NEGATIVE Final    Comment: (NOTE) SARS-CoV-2 target nucleic acids are NOT DETECTED.  The SARS-CoV-2 RNA is generally detectable in upper respiratoy specimens during the acute phase of infection. The lowest concentration of SARS-CoV-2 viral copies this assay can detect is 131 copies/mL. A negative result does not preclude SARS-Cov-2 infection and should not be used as the sole basis for treatment or other patient management decisions. A negative result may occur with  improper specimen collection/handling, submission of specimen other than nasopharyngeal swab, presence of viral mutation(s) within the areas targeted by this assay, and inadequate number of viral copies (<131 copies/mL). A negative result must be combined with clinical observations, patient history, and epidemiological information. The expected result is Negative.  Fact Sheet for Patients:  PinkCheek.be  Fact Sheet for Healthcare Providers:  GravelBags.it  This test is no t yet approved or cleared by the [place] FDA and  has been authorized for detection and/or diagnosis of SARS-CoV-2 by FDA under an Emergency Use Authorization (EUA). This EUA will remain  in effect (meaning this test can be used) for the duration of the COVID-19 declaration under Section 564(b)(1) of the Act, 21 U.S.C. section 360bbb-3(b)(1), unless the authorization is terminated or revoked sooner.     Influenza A by PCR NEGATIVE NEGATIVE Final   Influenza B by PCR NEGATIVE NEGATIVE Final    Comment: (NOTE) The Xpert Xpress SARS-CoV-2/FLU/RSV assay is intended as an aid in  the diagnosis  of influenza from Nasopharyngeal swab specimens and  should not be used as a sole basis for treatment. Nasal washings and  aspirates are unacceptable for Xpert Xpress SARS-CoV-2/FLU/RSV  testing.  Fact Sheet for Patients: PinkCheek.be  Fact Sheet for Healthcare Providers: GravelBags.it  This test is not yet approved or cleared by the [place] FDA and  has been authorized for detection and/or diagnosis of SARS-CoV-2 by  FDA under an Emergency Use Authorization (EUA). This EUA will remain  in effect (meaning this test can be used) for the duration of the  Covid-19 declaration under Section 564(b)(1) of the Act, 21  U.S.C. section 360bbb-3(b)(1), unless the authorization is  terminated or revoked. Performed at [facility], [place] [address] [place], [place] [address]      Labs: Basic Metabolic Panel: No results for input(s): NA, K, CL, CO2, GLUCOSE, BUN, CREATININE, CALCIUM, MG, PHOS in the last 168 hours. Liver Function Tests: No results for input(s): AST, ALT, ALKPHOS, BILITOT, PROT, ALBUMIN in the last 168 hours. No results for input(s): LIPASE, AMYLASE in the last 168 hours. No results for  input(s): AMMONIA in the last 168 hours. CBC: No results for input(s): WBC, NEUTROABS, HGB, HCT, MCV, PLT in the last 168 hours. Cardiac Enzymes: No results for input(s): CKTOTAL, CKMB, CKMBINDEX, TROPONINI in the last 168 hours. BNP: Invalid input(s): POCBNP CBG: No results for input(s): GLUCAP in the last 168 hours.    Signed:  [name]  APRN, MSN, FNP-C [facility] [address] [place], [place] [address] [phone]  [facility] 09/18/2020, 6:10 AM

## 2020-09-23 ENCOUNTER — Other Ambulatory Visit: Payer: Self-pay

## 2020-09-23 ENCOUNTER — Other Ambulatory Visit: Payer: Self-pay | Admitting: Family Medicine

## 2020-09-23 DIAGNOSIS — Z79891 Long term (current) use of opiate analgesic: Secondary | ICD-10-CM

## 2020-09-23 DIAGNOSIS — D571 Sickle-cell disease without crisis: Secondary | ICD-10-CM

## 2020-09-23 MED ORDER — OXYCODONE HCL 10 MG PO TABS: 10.0000 mg | ORAL_TABLET | Freq: Four times a day (QID) | ORAL | 0 refills | Status: DC | PRN

## 2020-09-23 NOTE — Telephone Encounter (Signed)
Please see patient request below.

## 2020-10-08 ENCOUNTER — Other Ambulatory Visit: Payer: Self-pay

## 2020-10-08 DIAGNOSIS — D571 Sickle-cell disease without crisis: Secondary | ICD-10-CM

## 2020-10-08 DIAGNOSIS — Z79891 Long term (current) use of opiate analgesic: Secondary | ICD-10-CM

## 2020-10-08 NOTE — Telephone Encounter (Signed)
Refill request

## 2020-10-09 ENCOUNTER — Other Ambulatory Visit: Payer: Self-pay

## 2020-10-09 DIAGNOSIS — Z79891 Long term (current) use of opiate analgesic: Secondary | ICD-10-CM

## 2020-10-09 DIAGNOSIS — D571 Sickle-cell disease without crisis: Secondary | ICD-10-CM

## 2020-10-09 NOTE — Telephone Encounter (Signed)
Please refill if appropriate

## 2020-10-10 ENCOUNTER — Telehealth: Payer: Self-pay | Admitting: Nurse Practitioner

## 2020-10-10 NOTE — Telephone Encounter (Signed)
Spoke w/ Medicaid and confirmed that correct documentation was sent for PA but additional clinical documentation needed because the initial PA was only approved for 6 months 03/2020-09/2020 and any subsequent Pas for approval requires clinical documentation/plan of care showing why pt needs to continue medication, additional documentation sent, will follow-up and inform pt on Monday.

## 2020-10-13 ENCOUNTER — Inpatient Hospital Stay (HOSPITAL_COMMUNITY)
Admission: AD | Admit: 2020-10-13 | Discharge: 2020-10-16 | DRG: 812 | Disposition: A | Discharge status: Home / Self Care | Payer: Medicaid Other | Attending: Internal Medicine | Admitting: Internal Medicine

## 2020-10-13 ENCOUNTER — Other Ambulatory Visit: Payer: Self-pay

## 2020-10-13 ENCOUNTER — Telehealth (HOSPITAL_COMMUNITY): Payer: Self-pay | Admitting: *Deleted

## 2020-10-13 ENCOUNTER — Telehealth: Payer: Self-pay

## 2020-10-13 ENCOUNTER — Telehealth: Payer: Self-pay | Admitting: Nurse Practitioner

## 2020-10-13 ENCOUNTER — Non-Acute Institutional Stay (HOSPITAL_COMMUNITY): Payer: Medicaid Other

## 2020-10-13 ENCOUNTER — Encounter (HOSPITAL_COMMUNITY): Payer: Self-pay | Admitting: Family Medicine

## 2020-10-13 DIAGNOSIS — D72829 Elevated white blood cell count, unspecified: Secondary | ICD-10-CM | POA: Diagnosis present

## 2020-10-13 DIAGNOSIS — Z79899 Other long term (current) drug therapy: Secondary | ICD-10-CM

## 2020-10-13 DIAGNOSIS — E876 Hypokalemia: Secondary | ICD-10-CM

## 2020-10-13 DIAGNOSIS — Z79891 Long term (current) use of opiate analgesic: Secondary | ICD-10-CM

## 2020-10-13 DIAGNOSIS — Z9049 Acquired absence of other specified parts of digestive tract: Secondary | ICD-10-CM

## 2020-10-13 DIAGNOSIS — F32A Depression, unspecified: Secondary | ICD-10-CM | POA: Diagnosis present

## 2020-10-13 DIAGNOSIS — Z832 Family history of diseases of the blood and blood-forming organs and certain disorders involving the immune mechanism: Secondary | ICD-10-CM

## 2020-10-13 DIAGNOSIS — Z23 Encounter for immunization: Secondary | ICD-10-CM

## 2020-10-13 DIAGNOSIS — R509 Fever, unspecified: Secondary | ICD-10-CM

## 2020-10-13 DIAGNOSIS — Z9081 Acquired absence of spleen: Secondary | ICD-10-CM

## 2020-10-13 DIAGNOSIS — D5700 Hb-SS disease with crisis, unspecified: Secondary | ICD-10-CM | POA: Diagnosis not present

## 2020-10-13 DIAGNOSIS — G894 Chronic pain syndrome: Secondary | ICD-10-CM | POA: Diagnosis present

## 2020-10-13 DIAGNOSIS — F17210 Nicotine dependence, cigarettes, uncomplicated: Secondary | ICD-10-CM | POA: Diagnosis present

## 2020-10-13 DIAGNOSIS — D571 Sickle-cell disease without crisis: Secondary | ICD-10-CM

## 2020-10-13 DIAGNOSIS — D638 Anemia in other chronic diseases classified elsewhere: Secondary | ICD-10-CM | POA: Diagnosis present

## 2020-10-13 DIAGNOSIS — Z7982 Long term (current) use of aspirin: Secondary | ICD-10-CM

## 2020-10-13 DIAGNOSIS — Z20822 Contact with and (suspected) exposure to covid-19: Secondary | ICD-10-CM | POA: Diagnosis present

## 2020-10-13 LAB — COMPREHENSIVE METABOLIC PANEL
ALT: 25 U/L (ref 0–44)
AST: 20 U/L (ref 15–41)
Albumin: 4.1 g/dL (ref 3.5–5.0)
Alkaline Phosphatase: 79 U/L (ref 38–126)
Anion gap: 13 (ref 5–15)
BUN: 11 mg/dL (ref 6–20)
CO2: 26 mmol/L (ref 22–32)
Calcium: 9.2 mg/dL (ref 8.9–10.3)
Chloride: 97 mmol/L — ABNORMAL LOW (ref 98–111)
Creatinine, Ser: 0.82 mg/dL (ref 0.44–1.00)
GFR, Estimated: >60 mL/min (ref >60)
Glucose, Bld: 103 mg/dL — ABNORMAL HIGH (ref 70–99)
Potassium: 3 mmol/L — ABNORMAL LOW (ref 3.5–5.1)
Sodium: 136 mmol/L (ref 135–145)
Total Bilirubin: 1.4 mg/dL — ABNORMAL HIGH (ref 0.3–1.2)
Total Protein: 8.9 g/dL — ABNORMAL HIGH (ref 6.5–8.1)

## 2020-10-13 LAB — CBC WITH DIFFERENTIAL/PLATELET
Abs Immature Granulocytes: 0.33 10*3/uL — ABNORMAL HIGH (ref 0.00–0.07)
Basophils Absolute: 0.1 10*3/uL (ref 0.0–0.1)
Basophils Relative: 0 %
Eosinophils Absolute: 0 10*3/uL (ref 0.0–0.5)
Eosinophils Relative: 0 %
HCT: 27.2 % — ABNORMAL LOW (ref 36.0–46.0)
Hemoglobin: 9.8 g/dL — ABNORMAL LOW (ref 12.0–15.0)
Immature Granulocytes: 1 %
Lymphocytes Relative: 5 %
Lymphs Abs: 1.8 10*3/uL (ref 0.7–4.0)
MCH: 29.7 pg (ref 26.0–34.0)
MCHC: 36 g/dL (ref 30.0–36.0)
MCV: 82.4 fL (ref 80.0–100.0)
Monocytes Absolute: 4 10*3/uL — ABNORMAL HIGH (ref 0.1–1.0)
Monocytes Relative: 12 %
Neutro Abs: 28.2 10*3/uL — ABNORMAL HIGH (ref 1.7–7.7)
Neutrophils Relative %: 82 %
Platelets: 456 10*3/uL — ABNORMAL HIGH (ref 150–400)
RBC: 3.3 MIL/uL — ABNORMAL LOW (ref 3.87–5.11)
RDW: 13 % (ref 11.5–15.5)
WBC: 34.5 10*3/uL — ABNORMAL HIGH (ref 4.0–10.5)
nRBC: 0 % (ref 0.0–0.2)

## 2020-10-13 LAB — RESP PANEL BY RT-PCR (FLU A&B, COVID) ARPGX2
Influenza A by PCR: NEGATIVE
Influenza B by PCR: NEGATIVE
SARS Coronavirus 2 by RT PCR: NEGATIVE

## 2020-10-13 LAB — RETICULOCYTES
Immature Retic Fract: 27.6 % — ABNORMAL HIGH (ref 2.3–15.9)
RBC.: 3.32 MIL/uL — ABNORMAL LOW (ref 3.87–5.11)
Retic Count, Absolute: 135.1 10*3/uL (ref 19.0–186.0)
Retic Ct Pct: 4.1 % — ABNORMAL HIGH (ref 0.4–3.1)

## 2020-10-13 LAB — PREGNANCY, URINE: Preg Test, Ur: NEGATIVE

## 2020-10-13 LAB — POTASSIUM: Potassium: 3.5 mmol/L (ref 3.5–5.1)

## 2020-10-13 MED ORDER — MORPHINE SULFATE ER 60 MG PO TBCR: 60.0000 mg | EXTENDED_RELEASE_TABLET | Freq: Two times a day (BID) | ORAL | 0 refills | Status: DC

## 2020-10-13 MED ORDER — ACETAMINOPHEN 500 MG PO TABS
500.0000 mg | ORAL_TABLET | Freq: Four times a day (QID) | ORAL | Status: DC | PRN
  Administered 2020-10-14 – 2020-10-15 (×2): 500 mg via ORAL
  Filled 2020-10-13 (×2): qty 1

## 2020-10-13 MED ORDER — INFLUENZA VAC SPLIT QUAD 0.5 ML IM SUSY
0.5000 mL | PREFILLED_SYRINGE | INTRAMUSCULAR | Status: AC | PRN
  Administered 2020-10-16: 12:00:00 0.5 mL via INTRAMUSCULAR
  Filled 2020-10-13: qty 0.5

## 2020-10-13 MED ORDER — CITALOPRAM HYDROBROMIDE 20 MG PO TABS
10.0000 mg | ORAL_TABLET | Freq: Every day | ORAL | Status: DC
  Administered 2020-10-13 – 2020-10-16 (×4): 10 mg via ORAL
  Filled 2020-10-13 (×4): qty 1

## 2020-10-13 MED ORDER — DIPHENHYDRAMINE HCL 25 MG PO CAPS
25.0000 mg | ORAL_CAPSULE | ORAL | Status: DC | PRN
  Administered 2020-10-14: 22:00:00 25 mg via ORAL
  Filled 2020-10-13: qty 1

## 2020-10-13 MED ORDER — MELATONIN 3 MG PO TABS
6.0000 mg | ORAL_TABLET | Freq: Every evening | ORAL | Status: DC | PRN
  Administered 2020-10-13 – 2020-10-14 (×2): 6 mg via ORAL
  Filled 2020-10-13 (×2): qty 2

## 2020-10-13 MED ORDER — CYCLOBENZAPRINE HCL 10 MG PO TABS
10.0000 mg | ORAL_TABLET | Freq: Two times a day (BID) | ORAL | Status: DC
  Administered 2020-10-13 – 2020-10-16 (×6): 10 mg via ORAL
  Filled 2020-10-13 (×6): qty 1

## 2020-10-13 MED ORDER — KETOROLAC TROMETHAMINE 15 MG/ML IJ SOLN
15.0000 mg | Freq: Four times a day (QID) | INTRAMUSCULAR | Status: DC
  Administered 2020-10-13 – 2020-10-16 (×11): 15 mg via INTRAVENOUS
  Filled 2020-10-13 (×11): qty 1

## 2020-10-13 MED ORDER — FOLIC ACID 1 MG PO TABS
1.0000 mg | ORAL_TABLET | Freq: Every day | ORAL | Status: DC
  Administered 2020-10-13 – 2020-10-16 (×4): 1 mg via ORAL
  Filled 2020-10-13 (×3): qty 1

## 2020-10-13 MED ORDER — NALOXONE HCL 0.4 MG/ML IJ SOLN: 0.4000 mg | INTRAMUSCULAR | Status: DC | PRN

## 2020-10-13 MED ORDER — ONDANSETRON HCL 4 MG/2ML IJ SOLN: 4.0000 mg | Freq: Four times a day (QID) | INTRAMUSCULAR | Status: DC | PRN

## 2020-10-13 MED ORDER — KETOROLAC TROMETHAMINE 30 MG/ML IJ SOLN
15.0000 mg | Freq: Once | INTRAMUSCULAR | Status: AC
  Administered 2020-10-13: 12:00:00 15 mg via INTRAVENOUS

## 2020-10-13 MED ORDER — ACETAMINOPHEN 500 MG PO TABS
1000.0000 mg | ORAL_TABLET | Freq: Once | ORAL | Status: AC
  Administered 2020-10-13: 10:00:00 1000 mg via ORAL
  Filled 2020-10-13: qty 2

## 2020-10-13 MED ORDER — LORATADINE 10 MG PO TABS
10.0000 mg | ORAL_TABLET | Freq: Every day | ORAL | Status: DC
  Administered 2020-10-14 – 2020-10-16 (×3): 10 mg via ORAL
  Filled 2020-10-13 (×3): qty 1

## 2020-10-13 MED ORDER — SODIUM CHLORIDE 0.9% FLUSH: 9.0000 mL | INTRAVENOUS | Status: DC | PRN

## 2020-10-13 MED ORDER — ASPIRIN EC 81 MG PO TBEC
81.0000 mg | DELAYED_RELEASE_TABLET | Freq: Every day | ORAL | Status: DC
  Administered 2020-10-13 – 2020-10-16 (×4): 81 mg via ORAL
  Filled 2020-10-13 (×4): qty 1

## 2020-10-13 MED ORDER — OXYCODONE HCL 5 MG PO TABS
10.0000 mg | ORAL_TABLET | Freq: Four times a day (QID) | ORAL | Status: DC | PRN
  Administered 2020-10-13 – 2020-10-14 (×3): 10 mg via ORAL
  Filled 2020-10-13 (×3): qty 2

## 2020-10-13 MED ORDER — ENOXAPARIN SODIUM 40 MG/0.4ML ~~LOC~~ SOLN
40.0000 mg | SUBCUTANEOUS | Status: DC
  Filled 2020-10-13 (×3): qty 0.4

## 2020-10-13 MED ORDER — SODIUM CHLORIDE 0.45 % IV SOLN: INTRAVENOUS | Status: DC

## 2020-10-13 MED ORDER — POLYETHYLENE GLYCOL 3350 17 G PO PACK: 17.0000 g | PACK | Freq: Every day | ORAL | Status: DC | PRN

## 2020-10-13 MED ORDER — OXYCODONE HCL 5 MG PO TABS
10.0000 mg | ORAL_TABLET | Freq: Once | ORAL | Status: AC
  Administered 2020-10-13: 14:00:00 10 mg via ORAL
  Filled 2020-10-13: qty 2

## 2020-10-13 MED ORDER — SENNOSIDES-DOCUSATE SODIUM 8.6-50 MG PO TABS
1.0000 | ORAL_TABLET | Freq: Two times a day (BID) | ORAL | Status: DC
  Administered 2020-10-13 – 2020-10-16 (×6): 1 via ORAL
  Filled 2020-10-13 (×6): qty 1

## 2020-10-13 MED ORDER — POTASSIUM CHLORIDE CRYS ER 20 MEQ PO TBCR
40.0000 meq | EXTENDED_RELEASE_TABLET | Freq: Once | ORAL | Status: AC
  Administered 2020-10-13: 13:00:00 40 meq via ORAL
  Filled 2020-10-13: qty 2

## 2020-10-13 MED ORDER — HYDROMORPHONE 1 MG/ML IV SOLN
INTRAVENOUS | Status: DC
  Administered 2020-10-13: 10.5 mg via INTRAVENOUS
  Administered 2020-10-13: 3 mg via INTRAVENOUS
  Administered 2020-10-13: 1.5 mg via INTRAVENOUS
  Administered 2020-10-13: 30 mg via INTRAVENOUS
  Administered 2020-10-14: 4 mg via INTRAVENOUS
  Administered 2020-10-14: 2.5 mg via INTRAVENOUS
  Administered 2020-10-14: 6 mg via INTRAVENOUS
  Administered 2020-10-14: 0 mg via INTRAVENOUS
  Administered 2020-10-14: 30 mg via INTRAVENOUS
  Administered 2020-10-14: 8.5 mg via INTRAVENOUS
  Administered 2020-10-14: 0.5 mg via INTRAVENOUS
  Administered 2020-10-15: 8 mg via INTRAVENOUS
  Administered 2020-10-15: 3 mg via INTRAVENOUS
  Administered 2020-10-15: 4 mg via INTRAVENOUS
  Administered 2020-10-15: 13.5 mg via INTRAVENOUS
  Filled 2020-10-13 (×3): qty 30

## 2020-10-13 NOTE — Progress Notes (Signed)
Patient admitted to the day infusion hospital for sickle cell pain crisis. Initially, patient reported back and bilateral leg pain rated 10/10. For pain management, patient placed on Dilaudid PCA, given Tylenol, Toradol and hydrated with IV fluids. Patient also reported some chest pain. EKG and chest x-ray done.  EKG showed sinus tach. Chest x-ray still pending. Patient's pain level remained high at 8/10. Provider notified and placed orders for overnight observation on inpatient unit.  Report called to [place], RN on [address]. Patient transferred to [place] in wheelchair. Patient alert, oriented and stable at transfer.

## 2020-10-13 NOTE — Telephone Encounter (Signed)
sent 

## 2020-10-13 NOTE — BH Specialist Note (Signed)
Integrated Behavioral Health General Follow Up Note  10/13/2020 Name: Carmen [name] MRN: [id number] DOB: 1991/10/08 Carmen [name] is a 29 y.o. year old female who sees Carmen Francois, NP for primary care. LCSW was initially consulted to assess mental health needs and assist the patient with Mental Health Counseling and Resources.  Interpreter: No.   Interpreter Name & Language: none  Assessment: Patient experiencing challenges related to living with chronic illness.  Ongoing Intervention: Today CSW met with patient at bedside in te day hospital. Patient has been in contact with staff at the [facility] after referral there last month. Patient needs assistance with transportation to their office to complete some intake paperwork. Coordinated with [facility] and [facility]) to assist with transportation.    Review of patient status, including review of consultants reports, relevant laboratory and other test results, and collaboration with appropriate care team members and the patient's provider was performed as part of comprehensive patient evaluation and provision of services.     Follow up Plan: 1. CSW available from clinic as needed.  Carmen [name], [facility] [phone]

## 2020-10-13 NOTE — Telephone Encounter (Signed)
Per Medicaid, Oxycodone approved 10/11/20-04/09/21, PA #: [id number].

## 2020-10-13 NOTE — Telephone Encounter (Signed)
Please see message below

## 2020-10-13 NOTE — Progress Notes (Signed)
   10/13/20 1801  Assess: MEWS Score  Temp 98 F (36.7 C)  BP 105/80  Pulse Rate (!) 119  Resp 15  SpO2 100 %  Assess: MEWS Score  MEWS Temp 0  MEWS Systolic 0  MEWS Pulse 2  MEWS RR 0  MEWS LOC 0  MEWS Score 2  MEWS Score Color Yellow  Assess: if the MEWS score is Yellow or Red  Were vital signs taken at a resting state? Yes  Focused Assessment No change from prior assessment  Early Detection of Sepsis Score *See Row Information* Low  MEWS guidelines implemented *See Row Information* Yes  Treat  MEWS Interventions Escalated (See documentation below)  Take Vital Signs  Increase Vital Sign Frequency  Yellow: Q 2hr X 2 then Q 4hr X 2, if remains yellow, continue Q 4hrs  Escalate  MEWS: Escalate Yellow: discuss with charge nurse/RN and consider discussing with provider and RRT  Notify: Charge Nurse/RN  Name of Charge Nurse/RN Notified [place]  Date Charge Nurse/RN Notified 10/13/20  Time Charge Nurse/RN Notified 1805  Document  Patient Outcome Other (Comment) (Will continue to monitor)  Progress note created (see row info) Yes  Patient vital signs after arrival to the unit triggered a yellow MEWS.  Patient is resting comfortably.  Patient has had tachycardia in the ED prior to arrival.  Patient still having pain, is on PCA and no other complaints.  Discussed with charge nurse, will continue to monitor and start yellow mews protocol.

## 2020-10-13 NOTE — H&P (Signed)
H&P  Patient Demographics:  Carmen [name], is a 30 y.o. female  MRN: [id number]   DOB - [birth year]  Admit Date - 10/13/2020  Outpatient Primary MD for the patient is Vevelyn Francois, NP      HPI:    Carmen [name] is a 29 year old female with a medical history significant for sickle cell disease, chronic pain syndrome, opiate dependence and tolerance, and history of anemia of chronic disease presents to sickle cell day clinic complaining of generalized pain that is consistent with previous pain crisis.  Patient also endorses pain primarily to central chest.  Patient says that pain intensity has been elevated over the past 3 days and has been unrelieved by oxycodone.  She last had oxycodone 10 mg this a.m. without sustained relief.  She attributes current pain crisis to changes in weather and she has been out of her long-acting pain medication for around 48 hours.  Patient denies any subjective fever, chills, shortness of breath, or headache.  No urinary symptoms, nausea, vomiting, or diarrhea.  No sick contacts, recent travel, or exposure to COVID-19.  Sickle cell clinic course:  Vital signs show:BP 100/61 (BP Location: Right Arm)   Pulse (!) 115   Temp 97.6 F (36.4 C) (Oral)   Resp 13   LMP 09/21/2020   SpO2 99%  Pulses ranged between 110-120, EKG shows sinus tachycardia.  WBCs elevated at 34.5.  Hemoglobin 9.8, consistent with patient's baseline.  Total bilirubin elevated at 1.4.  Potassium 3.0.  Chest x-ray pending, COVID-19 test pending.  Pain persists despite IV Dilaudid PCA, IV fluids, and IV Toradol.  Patient admitted in observation to [place] for further management of sickle cell pain crisis.    Review of systems:  In addition to the HPI above, patient reports No fever or chills No Headache, No changes with vision or hearing No problems swallowing food or liquids Central chest pain present, cough or shortness of breath No abdominal pain, No nausea or vomiting, Bowel movements  are regular No blood in stool or urine No dysuria No new skin rashes or bruises No new joints pains-aches No new weakness, tingling, numbness in any extremity No recent weight gain or loss No polyuria, polydypsia or polyphagia No significant Mental Stressors   With Past History of the following :   Past Medical History:  Diagnosis Date  . Anxiety   . Headache(784.0)   . Heart murmur   . Sickle cell crisis ([place])   . Syphilis 2015   Was diagnosed and received one injection of antibiotics  . Thrombocytosis 11/22/2014     CBC Latest Ref Rng & Units 12/13/2018 12/11/2018 12/10/2018 WBC 4.0 - 10.5 K/uL 14.8(H) 13.2(H) 15.9(H) Hemoglobin 12.0 - 15.0 g/dL 8.2(L) 7.7(L) 8.1(L) Hematocrit 36.0 - 46.0 % 23.7(L) 23.2(L) 24.5(L) Platelets 150 - 400 K/uL 326 399 388        Past Surgical History:  Procedure Laterality Date  . CESAREAN SECTION N/A 02/05/2019   Procedure: CESAREAN SECTION;  Surgeon: [name], MD;  Location: [facility] LD ORS;  Service: Obstetrics;  Laterality: N/A;  . CHOLECYSTECTOMY N/A 11/30/2014   Procedure: LAPAROSCOPIC CHOLECYSTECTOMY SINGLE SITE WITH INTRAOPERATIVE CHOLANGIOGRAM;  Surgeon: Michael Boston, MD;  Location: [facility] ORS;  Service: General;  Laterality: N/A;  . SPLENECTOMY       Social History:   Social History   Tobacco Use  . Smoking status: Current Some Day Smoker    Types: Cigarettes  . Smokeless tobacco: Never Used  Substance Use  Topics  . Alcohol use: No     Lives - At home   Family History :   Family History  Problem Relation Age of Onset  . Hypertension Mother   . Sickle cell anemia Sister   . Kidney disease Sister        Lupus  . Arthritis Sister   . Sickle cell anemia Sister   . Sickle cell trait Sister   . Heart disease Maternal Aunt        CABG  . Heart disease Maternal Uncle        CABG  . Lupus Sister      Home Medications:   Prior to Admission medications   Medication Sig Start Date End Date Taking? Authorizing Provider  ASPIRIN  LOW DOSE 81 MG EC tablet Take 81 mg by mouth daily. 08/01/20  Yes [provider]  cyclobenzaprine (FLEXERIL) 10 MG tablet TAKE 1 TABLET BY MOUTH TWICE A DAY 08/07/20  Yes Vevelyn Francois, NP  folic acid (FOLVITE) 1 MG tablet Take 1 tablet (1 mg total) by mouth daily. 03/15/19  Yes Lanae Boast, FNP  Oxycodone HCl 10 MG TABS Take 1 tablet (10 mg total) by mouth every 6 (six) hours as needed for up to 15 days. 09/29/20 10/14/20 Yes Azzie Glatter, FNP  Vitamin D, Ergocalciferol, (DRISDOL) 1.25 MG (50000 UNIT) CAPS capsule TAKE ONE CAPSULE BY MOUTH ONE TIME PER WEEK 09/03/20  Yes Vevelyn Francois, NP  acetaminophen (TYLENOL) 500 MG tablet Take 500 mg by mouth every 6 (six) hours as needed for mild pain or headache.    [provider]  citalopram (CELEXA) 10 MG tablet Take 1 tablet (10 mg total) by mouth daily. 07/24/20 10/22/20  Vevelyn Francois, NP  loratadine (CLARITIN) 10 MG tablet Take 1 tablet (10 mg total) by mouth daily. 03/28/20   Vevelyn Francois, NP  morphine (MS CONTIN) 60 MG 12 hr tablet Take 1 tablet (60 mg total) by mouth every 12 (twelve) hours. 10/13/20 11/12/20  Vevelyn Francois, NP  Prenatal Vit-Fe Fumarate-FA (PRENATAL VITAMIN PLUS LOW IRON) 27-1 MG TABS TAKE 1 TABLET BY MOUTH EVERY DAY Patient taking differently: Take 1 tablet by mouth daily.  03/03/20   Shelly Bombard, MD     Allergies:   No Known Allergies   Physical Exam:   Vitals:   Vitals:   10/13/20 1145 10/13/20 1523  BP: (!) 99/58 100/61  Pulse: (!) 106 (!) 115  Resp: (!) 26 13  Temp:  97.6 F (36.4 C)  SpO2: 99% 99%    Physical Exam: Constitutional: Patient appears well-developed and well-nourished. Not in obvious distress. HENT: Normocephalic, atraumatic, External right and left ear normal. Oropharynx is clear and moist.  Eyes: Conjunctivae and EOM are normal. PERRLA, no scleral icterus. Neck: Normal ROM. Neck supple. No JVD. No tracheal deviation. No thyromegaly. CVS: RRR, S1/S2 +, no  murmurs, no gallops, no carotid bruit.  Pulmonary: Effort and breath sounds normal, no stridor, rhonchi, wheezes, rales.  Abdominal: Soft. BS +, no distension, tenderness, rebound or guarding.  Musculoskeletal: Normal range of motion. No edema and no tenderness.  Lymphadenopathy: No lymphadenopathy noted, cervical, inguinal or axillary Neuro: Alert. Normal reflexes, muscle tone coordination. No cranial nerve deficit. Skin: Skin is warm and dry. No rash noted. Not diaphoretic. No erythema. No pallor. Psychiatric: Normal mood and affect. Behavior, judgment, thought content normal.   Data Review:   CBC Recent Labs  Lab 10/13/20 0959  WBC 34.5*  HGB 9.8*  HCT 27.2*  PLT 456*  MCV 82.4  MCH 29.7  MCHC 36.0  RDW 13.0  LYMPHSABS 1.8  MONOABS 4.0*  EOSABS 0.0  BASOSABS 0.1   ------------------------------------------------------------------------------------------------------------------  Chemistries  Recent Labs  Lab 10/13/20 0959  NA 136  K 3.0*  CL 97*  CO2 26  GLUCOSE 103*  BUN 11  CREATININE 0.82  CALCIUM 9.2  AST 20  ALT 25  ALKPHOS 79  BILITOT 1.4*   ------------------------------------------------------------------------------------------------------------------ CrCl cannot be calculated (Unknown ideal weight.). ------------------------------------------------------------------------------------------------------------------ No results for input(s): TSH, T4TOTAL, T3FREE, THYROIDAB in the last 72 hours.  Invalid input(s): FREET3  Coagulation profile No results for input(s): INR, PROTIME in the last 168 hours. ------------------------------------------------------------------------------------------------------------------- No results for input(s): DDIMER in the last 72 hours. -------------------------------------------------------------------------------------------------------------------  Cardiac Enzymes No results for input(s): CKMB, TROPONINI, MYOGLOBIN  in the last 168 hours.  Invalid input(s): CK ------------------------------------------------------------------------------------------------------------------ No results found for: BNP  ---------------------------------------------------------------------------------------------------------------  Urinalysis    Component Value Date/Time   COLORURINE YELLOW 09/08/2020 1030   APPEARANCEUR HAZY (A) 09/08/2020 1030   LABSPEC 1.013 09/08/2020 1030   PHURINE 6.0 09/08/2020 1030   GLUCOSEU NEGATIVE 09/08/2020 1030   HGBUR NEGATIVE 09/08/2020 1030   BILIRUBINUR NEGATIVE 09/08/2020 1030   BILIRUBINUR Negative 08/10/2019 1534   KETONESUR NEGATIVE 09/08/2020 1030   PROTEINUR NEGATIVE 09/08/2020 1030   UROBILINOGEN 1.0 08/10/2019 1534   UROBILINOGEN 1.0 01/12/2018 0943   NITRITE NEGATIVE 09/08/2020 1030   LEUKOCYTESUR NEGATIVE 09/08/2020 1030    ----------------------------------------------------------------------------------------------------------------   Imaging Results:    No results found.   Assessment & Plan:  Principal Problem:   Sickle cell pain crisis (HCC) Active Problems:   Hypokalemia   Leukocytosis   Chronic prescription opiate use  Sickle cell disease with pain crisis: Continue IV fluids, 0.45% saline at 100 mL/h IV Dilaudid PCA with settings of 0.5 mg, 10-minute lockout, and 3 mg/h Toradol 15 mg every 6 hours for total of 5 days Monitor vital signs very closely, reevaluate pain scale regularly, and supplemental oxygen as needed. Patient will be reevaluated for pain in the context of function and relationship to baseline as care progresses  Leukocytosis: WBCs 34.5.  Patient is afebrile.  Chest x-ray and COVID-19 test pending.  Urinalysis and urine culture pending.  Follow closely without antibiotics.  Repeat CBC in a.m.  Sickle cell anemia: Hemoglobin 9.8, consistent with patient's baseline.  There is no clinical indication for blood transfusion at this  time.  Hypokalemia: Potassium 3.0.  Replete.  Follow labs in AM.  Chronic pain syndrome: Continue MS Contin 60 mg every 12 hours Oxycodone 10 mg every 6 hours as needed  Depression: Continue citalopram 10 mg daily.  Patient currently denies any SI or HI.  Continue to monitor closely.  DVT Prophylaxis: Subcut Lovenox and SCDs  AM Labs Ordered, also please review Full Orders  Family Communication: Admission, patient's condition and plan of care including tests being ordered have been discussed with the patient who indicate understanding and agree with the plan and Code Status.  Code Status: Full Code  Consults called: None    Admission status: Inpatient    Time spent in minutes : 35 minutes   [place], MSN, FNP-C [facility] [address] [place], [place] [address] [phone]  10/13/2020 at 3:27 PM

## 2020-10-13 NOTE — Discharge Instructions (Signed)
Sickle Cell Anemia, Adult  Sickle cell anemia is a condition where your red blood cells are shaped like sickles. Red blood cells carry oxygen through the body. Sickle-shaped cells do not live as long as normal red blood cells. They also clump together and block blood from flowing through the blood vessels. This prevents the body from getting enough oxygen. Sickle cell anemia causes organ damage and pain. It also increases the risk of infection. Follow these instructions at home: Medicines  Take over-the-counter and prescription medicines only as told by your doctor.  If you were prescribed an antibiotic medicine, take it as told by your doctor. Do not stop taking the antibiotic even if you start to feel better.  If you develop a fever, do not take medicines to lower the fever right away. Tell your doctor about the fever. Managing pain, stiffness, and swelling  Try these methods to help with pain: ? Use a heating pad. ? Take a warm bath. ? Distract yourself, such as by watching TV. Eating and drinking  Drink enough fluid to keep your pee (urine) clear or pale yellow. Drink more in hot weather and during exercise.  Limit or avoid alcohol.  Eat a healthy diet. Eat plenty of fruits, vegetables, whole grains, and lean protein.  Take vitamins and supplements as told by your doctor. Traveling  When traveling, keep these with you: ? Your medical information. ? The names of your doctors. ? Your medicines.  If you need to take an airplane, talk to your doctor first. Activity  Rest often.  Avoid exercises that make your heart beat much faster, such as jogging. General instructions  Do not use products that have nicotine or tobacco, such as cigarettes and e-cigarettes. If you need help quitting, ask your doctor.  Consider wearing a medical alert bracelet.  Avoid being in high places (high altitudes), such as mountains.  Avoid very hot or cold temperatures.  Avoid places where the  temperature changes a lot.  Keep all follow-up visits as told by your doctor. This is important. Contact a doctor if:  A joint hurts.  Your feet or hands hurt or swell.  You feel tired (fatigued). Get help right away if:  You have symptoms of infection. These include: ? Fever. ? Chills. ? Being very tired. ? Irritability. ? Poor eating. ? Throwing up (vomiting).  You feel dizzy or faint.  You have new stomach pain, especially on the left side.  You have a an erection (priapism) that lasts more than 4 hours.  You have numbness in your arms or legs.  You have a hard time moving your arms or legs.  You have trouble talking.  You have pain that does not go away when you take medicine.  You are short of breath.  You are breathing fast.  You have a long-term cough.  You have pain in your chest.  You have a bad headache.  You have a stiff neck.  Your stomach looks bloated even though you did not eat much.  Your skin is pale.  You suddenly cannot see well. Summary  Sickle cell anemia is a condition where your red blood cells are shaped like sickles.  Follow your doctor's advice on ways to manage pain, food to eat, activities to do, and steps to take for safe travel.  Get medical help right away if you have any signs of infection, such as a fever. This information is not intended to replace advice given to you by   your health care provider. Make sure you discuss any questions you have with your health care provider. Document Revised: 02/09/2019 Document Reviewed: 11/23/2016 Elsevier Patient Education  2020 [employer] Inc.  

## 2020-10-13 NOTE — H&P (Signed)
[facility] History and Physical   Date: 10/13/2020  Patient name: Carmen [name] Medical record number: [id number] Date of birth: October 23, 1991 Age: 29 y.o. Gender: female PCP: Vevelyn Francois, NP  Attending physician: Tresa Garter, MD  Chief Complaint: Sickle cell pain  History of Present Illness: Carmen [name] is a 29 year old female with a medical history significant for sickle cell disease, chronic pain syndrome, opiate dependence and tolerance, and history of anemia of chronic disease presents complaining of generalized pain that is consistent with previous pain crisis.  Patient also endorses pain primarily to central chest.  Patient says that pain intensity has been elevated over the past 3 days and has been unrelieved by oxycodone.  She last had oxycodone 10 mg this a.m. without sustained relief.  She attributes current pain crisis to changes in weather and she has been out of her long-acting pain medication for around 48 hours.  Patient denies any subjective fever, chills, shortness of breath, or headache.  No urinary symptoms, nausea, vomiting, or diarrhea.  No sick contacts, recent travel, or exposure to COVID-19.  Meds: Medications Prior to Admission  Medication Sig Dispense Refill Last Dose  . acetaminophen (TYLENOL) 500 MG tablet Take 500 mg by mouth every 6 (six) hours as needed for mild pain or headache.     . ASPIRIN LOW DOSE 81 MG EC tablet Take 81 mg by mouth daily.     . citalopram (CELEXA) 10 MG tablet Take 1 tablet (10 mg total) by mouth daily. 30 tablet 2   . cyclobenzaprine (FLEXERIL) 10 MG tablet TAKE 1 TABLET BY MOUTH TWICE A DAY 60 tablet 2   . folic acid (FOLVITE) 1 MG tablet Take 1 tablet (1 mg total) by mouth daily. 90 tablet 3   . loratadine (CLARITIN) 10 MG tablet Take 1 tablet (10 mg total) by mouth daily. 30 tablet 11   . Oxycodone HCl 10 MG TABS Take 1 tablet (10 mg total) by mouth every 6 (six) hours as needed for up to 15 days. 60 tablet  0   . Prenatal Vit-Fe Fumarate-FA (PRENATAL VITAMIN PLUS LOW IRON) 27-1 MG TABS TAKE 1 TABLET BY MOUTH EVERY DAY (Patient taking differently: Take 1 tablet by mouth daily. ) 30 tablet 13   . Vitamin D, Ergocalciferol, (DRISDOL) 1.25 MG (50000 UNIT) CAPS capsule TAKE ONE CAPSULE BY MOUTH ONE TIME PER WEEK 30 capsule 1     Allergies: Patient has no known allergies. Past Medical History:  Diagnosis Date  . Anxiety   . Headache(784.0)   . Heart murmur   . Sickle cell crisis ([place])   . Syphilis 2015   Was diagnosed and received one injection of antibiotics  . Thrombocytosis 11/22/2014     CBC Latest Ref Rng & Units 12/13/2018 12/11/2018 12/10/2018 WBC 4.0 - 10.5 K/uL 14.8(H) 13.2(H) 15.9(H) Hemoglobin 12.0 - 15.0 g/dL 8.2(L) 7.7(L) 8.1(L) Hematocrit 36.0 - 46.0 % 23.7(L) 23.2(L) 24.5(L) Platelets 150 - 400 K/uL 326 399 388     Past Surgical History:  Procedure Laterality Date  . CESAREAN SECTION N/A 02/05/2019   Procedure: CESAREAN SECTION;  Surgeon: [name], MD;  Location: [facility] LD ORS;  Service: Obstetrics;  Laterality: N/A;  . CHOLECYSTECTOMY N/A 11/30/2014   Procedure: LAPAROSCOPIC CHOLECYSTECTOMY SINGLE SITE WITH INTRAOPERATIVE CHOLANGIOGRAM;  Surgeon: Michael Boston, MD;  Location: [facility] ORS;  Service: General;  Laterality: N/A;  . SPLENECTOMY     Family History  Problem Relation Age of Onset  .  Hypertension Mother   . Sickle cell anemia Sister   . Kidney disease Sister        Lupus  . Arthritis Sister   . Sickle cell anemia Sister   . Sickle cell trait Sister   . Heart disease Maternal Aunt        CABG  . Heart disease Maternal Uncle        CABG  . Lupus Sister    Social History   Socioeconomic History  . Marital status: Single    Spouse name: Not on file  . Number of children: Not on file  . Years of education: Not on file  . Highest education level: Not on file  Occupational History  . Not on file  Tobacco Use  . Smoking status: Current Some Day Smoker    Types:  Cigarettes  . Smokeless tobacco: Never Used  Vaping Use  . Vaping Use: Never used  Substance and Sexual Activity  . Alcohol use: No  . Drug use: No  . Sexual activity: Yes    Birth control/protection: Injection  Other Topics Concern  . Not on file  Social History Narrative  . Not on file   Social Determinants of Health   Financial Resource Strain: Not on file  Food Insecurity: Not on file  Transportation Needs: Not on file  Physical Activity: Not on file  Stress: Not on file  Social Connections: Not on file  Intimate Partner Violence: Not on file    Review of Systems  Constitutional: Negative.   HENT: Negative.   Eyes: Negative.   Respiratory: Negative.   Cardiovascular: Negative.   Gastrointestinal: Negative.   Genitourinary: Negative.   Musculoskeletal: Positive for back pain and joint pain.  Skin: Negative.   Neurological: Negative.   Psychiatric/Behavioral: Negative.  The patient is not nervous/anxious and does not have insomnia.     Physical Exam: not currently breastfeeding. Physical Exam Constitutional:      Appearance: Normal appearance.  HENT:     Nose: Nose normal.  Eyes:     Pupils: Pupils are equal, round, and reactive to light.  Cardiovascular:     Rate and Rhythm: Normal rate and regular rhythm.  Pulmonary:     Effort: Pulmonary effort is normal.  Abdominal:     General: Abdomen is flat. Bowel sounds are normal.  Neurological:     General: No focal deficit present.     Mental Status: She is alert. Mental status is at baseline.  Psychiatric:        Mood and Affect: Mood normal.        Behavior: Behavior normal.        Thought Content: Thought content normal.        Judgment: Judgment normal.      Lab results: No results found for this or any previous visit (from the past 24 hour(s)).  Imaging results:  No results found.   Assessment & Plan: Patient admitted to sickle cell day infusion center for management of pain crisis.   Patient is opiate tolerant Initiate IV dilaudid PCA. Settings of 0.5 mg, 10 minute lockout, and 3 mg/hr IV fluids, 0.45% saline at 100 ml/hr Toradol 15 mg IV times one dose Tylenol 1000 mg by mouth times one dose Review CBC with differential, complete metabolic panel, and reticulocytes as results become available. Baseline hemoglobin is Pain intensity will be reevaluated in context of functioning and relationship to baseline as care progresses If pain intensity remains elevated and/or sudden  change in hemodynamic stability transition to inpatient services for higher level of care.    [name]  APRN, MSN, FNP-C [facility] [address] [place], [place] [address] [phone]  10/13/2020, 9:35 AM

## 2020-10-13 NOTE — Telephone Encounter (Signed)
Patient called requesting to come to the day hospital for sickle cell pain. Patient reports back and bilateral leg pain rated 10/10. Reports last taking Oxycodone on Saturday. Patient is currently out of medication and can't pick it up due to issues with prior authorization. COVID-19 screening done and patient denies all symptoms and exposures. Denies fever, chest pain, nausea, vomiting, diarrhea and abdominal pain. Admits to having transportation without driving self. [place], [place] notified. Patient can come to the day hospital for pain management. Patient advised and expresses an understanding.

## 2020-10-14 ENCOUNTER — Observation Stay (HOSPITAL_COMMUNITY): Payer: Medicaid Other

## 2020-10-14 DIAGNOSIS — Z79891 Long term (current) use of opiate analgesic: Secondary | ICD-10-CM

## 2020-10-14 DIAGNOSIS — D72829 Elevated white blood cell count, unspecified: Secondary | ICD-10-CM

## 2020-10-14 DIAGNOSIS — Z7982 Long term (current) use of aspirin: Secondary | ICD-10-CM | POA: Diagnosis not present

## 2020-10-14 DIAGNOSIS — F17210 Nicotine dependence, cigarettes, uncomplicated: Secondary | ICD-10-CM | POA: Diagnosis present

## 2020-10-14 DIAGNOSIS — R509 Fever, unspecified: Secondary | ICD-10-CM | POA: Diagnosis not present

## 2020-10-14 DIAGNOSIS — Z9081 Acquired absence of spleen: Secondary | ICD-10-CM | POA: Diagnosis not present

## 2020-10-14 DIAGNOSIS — Z20822 Contact with and (suspected) exposure to covid-19: Secondary | ICD-10-CM | POA: Diagnosis present

## 2020-10-14 DIAGNOSIS — D5700 Hb-SS disease with crisis, unspecified: Secondary | ICD-10-CM | POA: Diagnosis present

## 2020-10-14 DIAGNOSIS — Z79899 Other long term (current) drug therapy: Secondary | ICD-10-CM | POA: Diagnosis not present

## 2020-10-14 DIAGNOSIS — Z832 Family history of diseases of the blood and blood-forming organs and certain disorders involving the immune mechanism: Secondary | ICD-10-CM | POA: Diagnosis not present

## 2020-10-14 DIAGNOSIS — G894 Chronic pain syndrome: Secondary | ICD-10-CM | POA: Diagnosis present

## 2020-10-14 DIAGNOSIS — E876 Hypokalemia: Secondary | ICD-10-CM | POA: Diagnosis present

## 2020-10-14 DIAGNOSIS — Z23 Encounter for immunization: Secondary | ICD-10-CM | POA: Diagnosis not present

## 2020-10-14 DIAGNOSIS — F32A Depression, unspecified: Secondary | ICD-10-CM | POA: Diagnosis present

## 2020-10-14 DIAGNOSIS — D638 Anemia in other chronic diseases classified elsewhere: Secondary | ICD-10-CM | POA: Diagnosis present

## 2020-10-14 DIAGNOSIS — Z9049 Acquired absence of other specified parts of digestive tract: Secondary | ICD-10-CM | POA: Diagnosis not present

## 2020-10-14 LAB — COMPREHENSIVE METABOLIC PANEL
ALT: 37 U/L (ref 0–44)
AST: 40 U/L (ref 15–41)
Albumin: 3.4 g/dL — ABNORMAL LOW (ref 3.5–5.0)
Alkaline Phosphatase: 76 U/L (ref 38–126)
Anion gap: 10 (ref 5–15)
BUN: 17 mg/dL (ref 6–20)
CO2: 26 mmol/L (ref 22–32)
Calcium: 8.8 mg/dL — ABNORMAL LOW (ref 8.9–10.3)
Chloride: 96 mmol/L — ABNORMAL LOW (ref 98–111)
Creatinine, Ser: 0.94 mg/dL (ref 0.44–1.00)
GFR, Estimated: >60 mL/min (ref >60)
Glucose, Bld: 94 mg/dL (ref 70–99)
Potassium: 3.6 mmol/L (ref 3.5–5.1)
Sodium: 132 mmol/L — ABNORMAL LOW (ref 135–145)
Total Bilirubin: 1.6 mg/dL — ABNORMAL HIGH (ref 0.3–1.2)
Total Protein: 7.5 g/dL (ref 6.5–8.1)

## 2020-10-14 LAB — CBC WITH DIFFERENTIAL/PLATELET
Abs Immature Granulocytes: 0.19 10*3/uL — ABNORMAL HIGH (ref 0.00–0.07)
Basophils Absolute: 0 10*3/uL (ref 0.0–0.1)
Basophils Relative: 0 %
Eosinophils Absolute: 0.1 10*3/uL (ref 0.0–0.5)
Eosinophils Relative: 1 %
HCT: 20.7 % — ABNORMAL LOW (ref 36.0–46.0)
Hemoglobin: 7.6 g/dL — ABNORMAL LOW (ref 12.0–15.0)
Immature Granulocytes: 1 %
Lymphocytes Relative: 12 %
Lymphs Abs: 2.7 10*3/uL (ref 0.7–4.0)
MCH: 29.3 pg (ref 26.0–34.0)
MCHC: 36.7 g/dL — ABNORMAL HIGH (ref 30.0–36.0)
MCV: 79.9 fL — ABNORMAL LOW (ref 80.0–100.0)
Monocytes Absolute: 3.1 10*3/uL — ABNORMAL HIGH (ref 0.1–1.0)
Monocytes Relative: 13 %
Neutro Abs: 16.7 10*3/uL — ABNORMAL HIGH (ref 1.7–7.7)
Neutrophils Relative %: 73 %
Platelets: 420 10*3/uL — ABNORMAL HIGH (ref 150–400)
RBC: 2.59 MIL/uL — ABNORMAL LOW (ref 3.87–5.11)
RDW: 13.3 % (ref 11.5–15.5)
WBC: 22.9 10*3/uL — ABNORMAL HIGH (ref 4.0–10.5)
nRBC: 0 % (ref 0.0–0.2)

## 2020-10-14 LAB — LACTIC ACID, PLASMA: Lactic Acid, Venous: 0.6 mmol/L (ref 0.5–1.9)

## 2020-10-14 MED ORDER — SODIUM CHLORIDE 0.9 % IV SOLN
1.0000 g | INTRAVENOUS | Status: DC
  Administered 2020-10-14 – 2020-10-16 (×3): 1 g via INTRAVENOUS
  Filled 2020-10-14 (×3): qty 1

## 2020-10-14 MED ORDER — SODIUM CHLORIDE 0.9% FLUSH: 10.0000 mL | INTRAVENOUS | Status: DC | PRN

## 2020-10-14 MED ORDER — HYDROMORPHONE HCL 1 MG/ML PO LIQD
1.5000 mg | ORAL | Status: DC | PRN
  Administered 2020-10-14: 1.5 mg via ORAL
  Filled 2020-10-14: qty 2

## 2020-10-14 MED ORDER — SODIUM CHLORIDE 0.9% FLUSH
10.0000 mL | Freq: Two times a day (BID) | INTRAVENOUS | Status: DC
  Administered 2020-10-14: 12:00:00 10 mL

## 2020-10-14 NOTE — Progress Notes (Signed)
Subjective: Carmen [name] is a 29 year old female with a medical history significant for sickle cell disease, chronic pain syndrome, opiate dependence and tolerance, and history of anemia of chronic disease that was admitted for sickle cell pain crisis.  Patient has been periodically febrile overnight with a max temperature of 103.  Heart rate 110s-120s.  Patient continues to complain of significant pain primarily to low back and lower extremities.  Pain persists despite IV Dilaudid PCA.  Patient rates pain as 8/10.  Denies headache, shortness of breath, urinary symptoms, nausea, vomiting, or diarrhea.   Objective:  Vital signs in last 24 hours:  Vitals:   10/14/20 0955 10/14/20 1229 10/14/20 1524 10/14/20 1543  BP: (!) 103/53 107/65 118/73   Pulse: (!) 109 (!) 103 (!) 110   Resp: 18 15 18 16   Temp: (!) 100.6 F (38.1 C) 99.3 F (37.4 C) 98.3 F (36.8 C)   TempSrc: Oral Oral Oral   SpO2: 99% 100% 100% 94%  Weight:      Height:        Intake/Output from previous day:   Intake/Output Summary (Last 24 hours) at 10/14/2020 1654 Last data filed at 10/14/2020 0351 Gross per 24 hour  Intake 1278.2 ml  Output --  Net 1278.2 ml    Physical Exam: General: Alert, awake, oriented x3, in no acute distress.  HEENT: [place]/AT PEERL, EOMI Neck: Trachea midline,  no masses, no thyromegal,y no JVD, no carotid bruit OROPHARYNX:  Moist, No exudate/ erythema/lesions.  Heart: Regular rate and rhythm, without murmurs, rubs, gallops, PMI non-displaced, no heaves or thrills on palpation.  Lungs: Clear to auscultation, no wheezing or rhonchi noted. No increased vocal fremitus resonant to percussion  Abdomen: Soft, nontender, nondistended, positive bowel sounds, no masses no hepatosplenomegaly noted..  Neuro: No focal neurological deficits noted cranial nerves II through XII grossly intact. DTRs 2+ bilaterally upper and lower extremities. Strength 5 out of 5 in bilateral upper and lower  extremities. Musculoskeletal: No warm swelling or erythema around joints, no spinal tenderness noted. Psychiatric: Patient alert and oriented x3, good insight and cognition, good recent to remote recall. Lymph node survey: No cervical axillary or inguinal lymphadenopathy noted.  Lab Results:  Basic Metabolic Panel:    Component Value Date/Time   NA 132 (L) 10/14/2020 1220   NA 142 07/02/2020 1040   K 3.6 10/14/2020 1220   CL 96 (L) 10/14/2020 1220   CO2 26 10/14/2020 1220   BUN 17 10/14/2020 1220   BUN 8 07/02/2020 1040   CREATININE 0.94 10/14/2020 1220   GLUCOSE 94 10/14/2020 1220   CALCIUM 8.8 (L) 10/14/2020 1220   CBC:    Component Value Date/Time   WBC 22.9 (H) 10/14/2020 1220   HGB 7.6 (L) 10/14/2020 1220   HGB 9.8 (L) 07/02/2020 1040   HCT 20.7 (L) 10/14/2020 1220   HCT 28.9 (L) 07/02/2020 1040   PLT 420 (H) 10/14/2020 1220   PLT 628 (H) 07/02/2020 1040   MCV 79.9 (L) 10/14/2020 1220   MCV 88 07/02/2020 1040   NEUTROABS 16.7 (H) 10/14/2020 1220   NEUTROABS 3.0 07/02/2020 1040   LYMPHSABS 2.7 10/14/2020 1220   LYMPHSABS 3.5 (H) 07/02/2020 1040   MONOABS 3.1 (H) 10/14/2020 1220   EOSABS 0.1 10/14/2020 1220   EOSABS 0.2 07/02/2020 1040   BASOSABS 0.0 10/14/2020 1220   BASOSABS 0.1 07/02/2020 1040    Recent Results (from the past 240 hour(s))  Resp Panel by RT-PCR (Flu A&B, Covid) Nasopharyngeal Swab  Status: None   Collection Time: 10/13/20  5:29 PM   Specimen: Nasopharyngeal Swab; Nasopharyngeal(NP) swabs in vial transport medium  Result Value Ref Range Status   SARS Coronavirus 2 by RT PCR NEGATIVE NEGATIVE Final    Comment: (NOTE) SARS-CoV-2 target nucleic acids are NOT DETECTED.  The SARS-CoV-2 RNA is generally detectable in upper respiratory specimens during the acute phase of infection. The lowest concentration of SARS-CoV-2 viral copies this assay can detect is 138 copies/mL. A negative result does not preclude SARS-Cov-2 infection and should not  be used as the sole basis for treatment or other patient management decisions. A negative result may occur with  improper specimen collection/handling, submission of specimen other than nasopharyngeal swab, presence of viral mutation(s) within the areas targeted by this assay, and inadequate number of viral copies(<138 copies/mL). A negative result must be combined with clinical observations, patient history, and epidemiological information. The expected result is Negative.  Fact Sheet for Patients:  EntrepreneurPulse.com.au  Fact Sheet for Healthcare Providers:  IncredibleEmployment.be  This test is no t yet approved or cleared by the [place] FDA and  has been authorized for detection and/or diagnosis of SARS-CoV-2 by FDA under an Emergency Use Authorization (EUA). This EUA will remain  in effect (meaning this test can be used) for the duration of the COVID-19 declaration under Section 564(b)(1) of the Act, 21 U.S.C.section 360bbb-3(b)(1), unless the authorization is terminated  or revoked sooner.       Influenza A by PCR NEGATIVE NEGATIVE Final   Influenza B by PCR NEGATIVE NEGATIVE Final    Comment: (NOTE) The Xpert Xpress SARS-CoV-2/FLU/RSV plus assay is intended as an aid in the diagnosis of influenza from Nasopharyngeal swab specimens and should not be used as a sole basis for treatment. Nasal washings and aspirates are unacceptable for Xpert Xpress SARS-CoV-2/FLU/RSV testing.  Fact Sheet for Patients: EntrepreneurPulse.com.au  Fact Sheet for Healthcare Providers: IncredibleEmployment.be  This test is not yet approved or cleared by the [place] FDA and has been authorized for detection and/or diagnosis of SARS-CoV-2 by FDA under an Emergency Use Authorization (EUA). This EUA will remain in effect (meaning this test can be used) for the duration of the COVID-19 declaration under Section  564(b)(1) of the Act, 21 U.S.C. section 360bbb-3(b)(1), unless the authorization is terminated or revoked.  Performed at [facility] [address] [place], [place] [address]     Studies/Results: DG Chest 2 View  Result Date: 10/14/2020 CLINICAL DATA:  Sickle cell crisis.  Pain. EXAM: CHEST - 2 VIEW COMPARISON:  10/13/2020 FINDINGS: Minimal bibasilar atelectasis unchanged. No acute infiltrate or effusion. Heart size and vascularity normal. IMPRESSION: No interval change minimal bibasilar atelectasis. Electronically Signed   By: Franchot Gallo M.D.   On: 10/14/2020 10:16   DG Chest 2 View  Result Date: 10/13/2020 CLINICAL DATA:  Sickle cell pain crisis. EXAM: CHEST - 2 VIEW COMPARISON:  Most recent radiograph 07/10/2020 FINDINGS: The heart is normal in size.The cardiomediastinal contours are normal. Subsegmental atelectasis at both lung bases. Pulmonary vasculature is normal. No consolidation, pleural effusion, or pneumothorax. No acute osseous abnormalities are seen. Thoracic spine changes related to sickle cell with central endplate depressions. IMPRESSION: Subsegmental bibasilar atelectasis. Electronically Signed   By: Keith Rake M.D.   On: 10/13/2020 22:46    Medications: Scheduled Meds: . aspirin EC  81 mg Oral Daily  . citalopram  10 mg Oral Daily  . cyclobenzaprine  10 mg Oral BID  . enoxaparin (  LOVENOX) injection  40 mg Subcutaneous Q24H  . folic acid  1 mg Oral Daily  . HYDROmorphone   Intravenous Q4H  . ketorolac  15 mg Intravenous Q6H  . loratadine  10 mg Oral Daily  . senna-docusate  1 tablet Oral BID  . sodium chloride flush  10-40 mL Intracatheter Q12H   Continuous Infusions: . sodium chloride Stopped (10/14/20 0215)  . cefTRIAXone (ROCEPHIN)  IV 1 g (10/14/20 1217)   PRN Meds:.acetaminophen, diphenhydrAMINE, influenza vac split quadrivalent PF, melatonin, naloxone **AND** sodium chloride flush, ondansetron (ZOFRAN) IV, oxyCODONE,  polyethylene glycol, sodium chloride flush  Consultants:  None  Procedures:  None  Antibiotics:  IV ceftriaxone  Assessment/Plan: Principal Problem:   Sickle cell pain crisis ([place]) Active Problems:   Hypokalemia   Leukocytosis   Chronic prescription opiate use  Fever of unknown origin: Maximum temperature 103.  Chest x-ray shows bilateral atelectasis.  Urinalysis and urine culture pending.  Blood cultures pending.  Lactic acid negative.  WBCs elevated at 22.4, which is improved from previous.  Continue to follow closely.  Initiate IV antibiotics.  Tylenol 550 mg every 4 hours as needed for fever.  Sickle cell disease with pain crisis: 0.45% saline at 100 mL/h. Continue IV Dilaudid PCA without any changes in settings on today. Toradol 15 mg IV every 6 hours for total of 5 days Monitor vital signs very closely, reevaluate pain scale regularly, and supplemental as needed.  Atypical chest pain: EKG shows sinus tachycardia.  Chest x-ray shows no acute cardiopulmonary process, bilateral atelectasis.  Repeated chest x-ray, no changes.  Initiate telemetry. Continue to monitor closely.  Chronic pain syndrome: Continue home medications  Leukocytosis: WBCs 22.4, improved from previous.  Patient febrile overnight.  Repeat chest x-ray shows bilateral atelectasis, no acute cardiopulmonary process.  Blood cultures pending.  Urinalysis and urine culture pending.  Initiate IV antibiotics.  Follow labs in AM.  Sickle cell anemia: Hemoglobin 7.6, decrease below patient's baseline.  Continue to follow closely.  No blood transfusion warranted at this time.  Repeat CBC and type and screen in a.m.  If hemoglobin is less than 7.0 g/dL, transfuse 1 unit PRBCs.  History of depression: Continue citalopram 10 mg daily.  Patient denies any suicidal or homicidal intent.  Continue to follow closely.   Code Status: Full Code Family Communication: N/A Disposition Plan: Not yet ready for  discharge  [place], MSN, FNP-C [facility] [address] [place], [place] [address] [phone]  If 5PM-8AM, please contact night-coverage.  10/14/2020, 4:54 PM  LOS: 0 days

## 2020-10-14 NOTE — Progress Notes (Signed)
Chaplain engaged in initial visit with Carmen [name].  During visit, Carmen [name] shared about having sickle cell and also having trouble sleeping last night.  Carmen [name] voiced that she asked about receiving an Ambien last night because of how hard it can be to sleep overnight in a hospital and how that created some tension and confusion with her nurse the night before.  She also expressed how difficult it was for security to become involved.  Chaplain expressed wanting her to feel supported as well as the nurses on the unit and offered to help if needed.  Carmen [name] verbalized that she is not a stranger to being in the hospital due to having sickle cell.    Chaplain will follow-up.    10/14/20 1200  Clinical Encounter Type  Visited With Patient  Visit Type Initial

## 2020-10-14 NOTE — Progress Notes (Signed)
   10/14/20 0814  Assess: MEWS Score  Temp (!) 102.8 F (39.3 C)  BP 114/69  Pulse Rate (!) 121  Resp 16  SpO2 99 %  O2 Device Room Air  Assess: MEWS Score  MEWS Temp 2  MEWS Systolic 0  MEWS Pulse 2  MEWS RR 0  MEWS LOC 0  MEWS Score 4  MEWS Score Color Red  Assess: if the MEWS score is Yellow or Red  Were vital signs taken at a resting state? Yes  Focused Assessment No change from prior assessment  Early Detection of Sepsis Score *See Row Information* Low  MEWS guidelines implemented *See Row Information* Yes  Take Vital Signs  Increase Vital Sign Frequency  Red: Q 1hr X 4 then Q 4hr X 4, if remains red, continue Q 4hrs  Escalate  MEWS: Escalate Red: discuss with charge nurse/RN and provider, consider discussing with RRT  Notify: Charge Nurse/RN  Name of Charge Nurse/RN Notified [place] RN  Date Charge Nurse/RN Notified 10/14/20  Time Charge Nurse/RN Notified [id number]  Notify: Provider  Provider Name/Title Lovett Sox  Date Provider Notified 10/14/20  Time Provider Notified 0820  Notification Type Call  Notification Reason Change in status  Response See new orders  Date of Provider Response 10/14/20  Time of Provider Response 0820  Document  Patient Outcome Stabilized after interventions  Progress note created (see row info) Yes

## 2020-10-14 NOTE — Telephone Encounter (Signed)
Sent to provider 

## 2020-10-14 NOTE — Progress Notes (Signed)
   10/14/20 2004  Assess: MEWS Score  Temp 99.5 F (37.5 C)  BP 123/70  Pulse Rate (!) 122  Resp 16  SpO2 95 %  O2 Device Nasal Cannula  Assess: MEWS Score  MEWS Temp 0  MEWS Systolic 0  MEWS Pulse 2  MEWS RR 0  MEWS LOC 0  MEWS Score 2  MEWS Score Color Yellow  Assess: if the MEWS score is Yellow or Red  Were vital signs taken at a resting state? Yes  Focused Assessment No change from prior assessment  Early Detection of Sepsis Score *See Row Information* Low  MEWS guidelines implemented *See Row Information* No, previously yellow, continue vital signs every 4 hours  Treat  Pain Scale 0-10  Pain Score 7  Pain Type Chronic pain  Pain Location Generalized  Pain Descriptors / Indicators Aching;Constant  Pain Frequency Constant  Pain Onset On-going  Patients Stated Pain Goal 5  Pain Intervention(s) PCA encouraged  Multiple Pain Sites No   Pt previously Yellow for tachycardia, has been tachy since arriving to unit from ED. Pt states she is feeling better, will cont to monitor.

## 2020-10-15 LAB — CBC
HCT: 19.8 % — ABNORMAL LOW (ref 36.0–46.0)
Hemoglobin: 7.1 g/dL — ABNORMAL LOW (ref 12.0–15.0)
MCH: 29 pg (ref 26.0–34.0)
MCHC: 35.9 g/dL (ref 30.0–36.0)
MCV: 80.8 fL (ref 80.0–100.0)
Platelets: 437 10*3/uL — ABNORMAL HIGH (ref 150–400)
RBC: 2.45 MIL/uL — ABNORMAL LOW (ref 3.87–5.11)
RDW: 14.1 % (ref 11.5–15.5)
WBC: 22.3 10*3/uL — ABNORMAL HIGH (ref 4.0–10.5)
nRBC: 0 % (ref 0.0–0.2)

## 2020-10-15 LAB — BASIC METABOLIC PANEL
Anion gap: 11 (ref 5–15)
BUN: 16 mg/dL (ref 6–20)
CO2: 25 mmol/L (ref 22–32)
Calcium: 8.7 mg/dL — ABNORMAL LOW (ref 8.9–10.3)
Chloride: 97 mmol/L — ABNORMAL LOW (ref 98–111)
Creatinine, Ser: 0.82 mg/dL (ref 0.44–1.00)
GFR, Estimated: >60 mL/min (ref >60)
Glucose, Bld: 93 mg/dL (ref 70–99)
Potassium: 3.5 mmol/L (ref 3.5–5.1)
Sodium: 133 mmol/L — ABNORMAL LOW (ref 135–145)

## 2020-10-15 MED ORDER — METOPROLOL TARTRATE 5 MG/5ML IV SOLN
2.5000 mg | Freq: Once | INTRAVENOUS | Status: AC
  Administered 2020-10-15: 15:00:00 2.5 mg via INTRAVENOUS
  Filled 2020-10-15: qty 5

## 2020-10-15 MED ORDER — OXYCODONE HCL 5 MG PO TABS: 10.0000 mg | ORAL_TABLET | ORAL | Status: DC | PRN

## 2020-10-15 MED ORDER — METOPROLOL SUCCINATE ER 25 MG PO TB24
12.5000 mg | ORAL_TABLET | Freq: Every day | ORAL | Status: DC
  Administered 2020-10-16: 10:00:00 12.5 mg via ORAL
  Filled 2020-10-15: qty 1

## 2020-10-15 MED ORDER — HYDROMORPHONE 1 MG/ML IV SOLN
INTRAVENOUS | Status: DC
  Administered 2020-10-15: 4.5 mg via INTRAVENOUS
  Administered 2020-10-16: 5.5 mg via INTRAVENOUS
  Administered 2020-10-16: 30 mg via INTRAVENOUS
  Administered 2020-10-16: 5 mg via INTRAVENOUS
  Administered 2020-10-16: 5.5 mg via INTRAVENOUS
  Administered 2020-10-16: 1 mg via INTRAVENOUS
  Filled 2020-10-15: qty 30

## 2020-10-15 NOTE — Progress Notes (Signed)
Subjective: Carmen [name] is a 29 year old female with a medical history significant for sickle cell disease, chronic pain syndrome, opiate dependence and tolerance, and history of anemia of chronic disease that was admitted for sickle cell pain crisis.   Heart rate remains elevated 110s-120s.  Patient continues to complain of  pain primarily to low back and lower extremities.  Pain intensity is 8/10. Denies headache, shortness of breath, urinary symptoms, nausea, vomiting, or diarrhea.   Objective:  Vital signs in last 24 hours:  Vitals:   10/15/20 1139 10/15/20 1154 10/15/20 1344 10/15/20 1531  BP:  108/78 120/80   Pulse:  (!) 102 (!) 125   Resp: 17 16 17 13   Temp:  100 F (37.8 C) 98.5 F (36.9 C)   TempSrc:  Oral Oral   SpO2: 95% 100% 97% 97%  Weight:      Height:        Intake/Output from previous day:   Intake/Output Summary (Last 24 hours) at 10/15/2020 1659 Last data filed at 10/15/2020 1012 Gross per 24 hour  Intake 531.03 ml  Output --  Net 531.03 ml    Physical Exam: General: Alert, awake, oriented x3, in no acute distress.  HEENT: [place]/AT PEERL, EOMI Neck: Trachea midline,  no masses, no thyromegal,y no JVD, no carotid bruit OROPHARYNX:  Moist, No exudate/ erythema/lesions.  Heart: Regular rate and rhythm, without murmurs, rubs, gallops, PMI non-displaced, no heaves or thrills on palpation.  Lungs: Clear to auscultation, no wheezing or rhonchi noted. No increased vocal fremitus resonant to percussion  Abdomen: Soft, nontender, nondistended, positive bowel sounds, no masses no hepatosplenomegaly noted..  Neuro: No focal neurological deficits noted cranial nerves II through XII grossly intact. DTRs 2+ bilaterally upper and lower extremities. Strength 5 out of 5 in bilateral upper and lower extremities. Musculoskeletal: No warm swelling or erythema around joints, no spinal tenderness noted. Psychiatric: Patient alert and oriented x3, good insight and cognition, good  recent to remote recall. Lymph node survey: No cervical axillary or inguinal lymphadenopathy noted.  Lab Results:  Basic Metabolic Panel:    Component Value Date/Time   NA 133 (L) 10/15/2020 0440   NA 142 07/02/2020 1040   K 3.5 10/15/2020 0440   CL 97 (L) 10/15/2020 0440   CO2 25 10/15/2020 0440   BUN 16 10/15/2020 0440   BUN 8 07/02/2020 1040   CREATININE 0.82 10/15/2020 0440   GLUCOSE 93 10/15/2020 0440   CALCIUM 8.7 (L) 10/15/2020 0440   CBC:    Component Value Date/Time   WBC 22.3 (H) 10/15/2020 0440   HGB 7.1 (L) 10/15/2020 0440   HGB 9.8 (L) 07/02/2020 1040   HCT 19.8 (L) 10/15/2020 0440   HCT 28.9 (L) 07/02/2020 1040   PLT 437 (H) 10/15/2020 0440   PLT 628 (H) 07/02/2020 1040   MCV 80.8 10/15/2020 0440   MCV 88 07/02/2020 1040   NEUTROABS 16.7 (H) 10/14/2020 1220   NEUTROABS 3.0 07/02/2020 1040   LYMPHSABS 2.7 10/14/2020 1220   LYMPHSABS 3.5 (H) 07/02/2020 1040   MONOABS 3.1 (H) 10/14/2020 1220   EOSABS 0.1 10/14/2020 1220   EOSABS 0.2 07/02/2020 1040   BASOSABS 0.0 10/14/2020 1220   BASOSABS 0.1 07/02/2020 1040    Recent Results (from the past 240 hour(s))  Resp Panel by RT-PCR (Flu A&B, Covid) Nasopharyngeal Swab     Status: None   Collection Time: 10/13/20  5:29 PM   Specimen: Nasopharyngeal Swab; Nasopharyngeal(NP) swabs in vial transport medium  Result  Value Ref Range Status   SARS Coronavirus 2 by RT PCR NEGATIVE NEGATIVE Final    Comment: (NOTE) SARS-CoV-2 target nucleic acids are NOT DETECTED.  The SARS-CoV-2 RNA is generally detectable in upper respiratory specimens during the acute phase of infection. The lowest concentration of SARS-CoV-2 viral copies this assay can detect is 138 copies/mL. A negative result does not preclude SARS-Cov-2 infection and should not be used as the sole basis for treatment or other patient management decisions. A negative result may occur with  improper specimen collection/handling, submission of specimen  other than nasopharyngeal swab, presence of viral mutation(s) within the areas targeted by this assay, and inadequate number of viral copies(<138 copies/mL). A negative result must be combined with clinical observations, patient history, and epidemiological information. The expected result is Negative.  Fact Sheet for Patients:  EntrepreneurPulse.com.au  Fact Sheet for Healthcare Providers:  IncredibleEmployment.be  This test is no t yet approved or cleared by the [place] FDA and  has been authorized for detection and/or diagnosis of SARS-CoV-2 by FDA under an Emergency Use Authorization (EUA). This EUA will remain  in effect (meaning this test can be used) for the duration of the COVID-19 declaration under Section 564(b)(1) of the Act, 21 U.S.C.section 360bbb-3(b)(1), unless the authorization is terminated  or revoked sooner.       Influenza A by PCR NEGATIVE NEGATIVE Final   Influenza B by PCR NEGATIVE NEGATIVE Final    Comment: (NOTE) The Xpert Xpress SARS-CoV-2/FLU/RSV plus assay is intended as an aid in the diagnosis of influenza from Nasopharyngeal swab specimens and should not be used as a sole basis for treatment. Nasal washings and aspirates are unacceptable for Xpert Xpress SARS-CoV-2/FLU/RSV testing.  Fact Sheet for Patients: EntrepreneurPulse.com.au  Fact Sheet for Healthcare Providers: IncredibleEmployment.be  This test is not yet approved or cleared by the [place] FDA and has been authorized for detection and/or diagnosis of SARS-CoV-2 by FDA under an Emergency Use Authorization (EUA). This EUA will remain in effect (meaning this test can be used) for the duration of the COVID-19 declaration under Section 564(b)(1) of the Act, 21 U.S.C. section 360bbb-3(b)(1), unless the authorization is terminated or revoked.  Performed at [facility], [place] [address], [place], [place] [address]   Culture, blood (Routine X 2) w Reflex to ID Panel     Status: None (Preliminary result)   Collection Time: 10/14/20 11:57 AM   Specimen: BLOOD  Result Value Ref Range Status   Specimen Description   Final    BLOOD LEFT ARM Performed at [facility] [address] [place], [place] [address]    Special Requests   Final    BOTTLES DRAWN AEROBIC AND ANAEROBIC Blood Culture adequate volume Performed at [facility] [address] [place], [place] [address]    Culture   Final    NO GROWTH < 24 HOURS Performed at [facility] [address], [place],  [address]    Report Status PENDING  Incomplete    Studies/Results: DG Chest 2 View  Result Date: 10/14/2020 CLINICAL DATA:  Sickle cell crisis.  Pain. EXAM: CHEST - 2 VIEW COMPARISON:  10/13/2020 FINDINGS: Minimal bibasilar atelectasis unchanged. No acute infiltrate or effusion. Heart size and vascularity normal. IMPRESSION: No interval change minimal bibasilar atelectasis. Electronically Signed   By: Franchot Gallo M.D.   On: 10/14/2020 10:16    Medications: Scheduled Meds:  aspirin EC  81 mg Oral Daily  citalopram  10 mg Oral Daily   cyclobenzaprine  10 mg Oral BID   enoxaparin (LOVENOX) injection  40 mg Subcutaneous W44Q   folic acid  1 mg Oral Daily   HYDROmorphone   Intravenous Q4H   ketorolac  15 mg Intravenous Q6H   loratadine  10 mg Oral Daily   [START ON 10/16/2020] metoprolol succinate  12.5 mg Oral Daily   senna-docusate  1 tablet Oral BID   sodium chloride flush  10-40 mL Intracatheter Q12H   Continuous Infusions:  sodium chloride 20 mL/hr at 10/15/20 1531   cefTRIAXone (ROCEPHIN)  IV 1 g (10/15/20 1220)   PRN Meds:.acetaminophen, diphenhydrAMINE, influenza vac split quadrivalent PF, melatonin, naloxone **AND** sodium chloride flush, ondansetron (ZOFRAN) IV, oxyCODONE, polyethylene glycol, sodium chloride  flush  Consultants:  None  Procedures:  None  Antibiotics:  IV ceftriaxone  Assessment/Plan: Principal Problem:   Sickle cell pain crisis ([place]) Active Problems:   Hypokalemia   Leukocytosis   Chronic prescription opiate use   Fever of unknown origin  Fever of unknown origin: Appears to be resolved. Blood cultures negative. Tylenol 650 mg every 6 hours as needed for fever.    Sickle cell disease with pain crisis: Decrease IV fluids to KVO Continue IV Dilaudid PCA without any changes in settings on today. Toradol 15 mg IV every 6 hours for total of 5 days Monitor vital signs very closely, reevaluate pain scale regularly, and supplemental as needed.  Atypical chest pain: EKG shows sinus tachycardia.  Chest x-ray shows no acute cardiopulmonary process, bilateral atelectasis.  Repeated chest x-ray, no changes.  Initiate telemetry. Metoprolol 12.5 mg daily Continue to monitor closely.  Chronic pain syndrome: Continue home medications  Leukocytosis: WBCs 22.4, improved from previous.  Patient febrile overnight.  Repeat chest x-ray shows bilateral atelectasis, no acute cardiopulmonary process.  Blood cultures pending.  Urinalysis and urine culture pending.  Initiate IV antibiotics.  Follow labs in AM.  Sickle cell anemia: Hemoglobin 7.1, decrease below patient's baseline.  Continue to follow closely.  No blood transfusion warranted at this time.  Repeat CBC in am.  If hemoglobin is less than 7.0 g/dL, transfuse 1 unit PRBCs.  History of depression: Continue citalopram 10 mg daily.  Patient denies any suicidal or homicidal intent.  Continue to follow closely.   Code Status: Full Code Family Communication: N/A Disposition Plan: Not yet ready for discharge  [place], MSN, FNP-C Patient [facility] [address] [place], [place] [address] [phone]  If 5PM-8AM, please contact night-coverage.  10/15/2020, 4:59 PM   LOS: 1 day

## 2020-10-16 ENCOUNTER — Other Ambulatory Visit: Payer: Self-pay

## 2020-10-16 ENCOUNTER — Telehealth (INDEPENDENT_AMBULATORY_CARE_PROVIDER_SITE_OTHER): Payer: Medicaid Other | Admitting: Nurse Practitioner

## 2020-10-16 DIAGNOSIS — D571 Sickle-cell disease without crisis: Secondary | ICD-10-CM

## 2020-10-16 DIAGNOSIS — Z79891 Long term (current) use of opiate analgesic: Secondary | ICD-10-CM

## 2020-10-16 LAB — CBC
HCT: 18.7 % — ABNORMAL LOW (ref 36.0–46.0)
Hemoglobin: 6.6 g/dL — CL (ref 12.0–15.0)
MCH: 28.2 pg (ref 26.0–34.0)
MCHC: 35.3 g/dL (ref 30.0–36.0)
MCV: 79.9 fL — ABNORMAL LOW (ref 80.0–100.0)
Platelets: 530 10*3/uL — ABNORMAL HIGH (ref 150–400)
RBC: 2.34 MIL/uL — ABNORMAL LOW (ref 3.87–5.11)
RDW: 15.1 % (ref 11.5–15.5)
WBC: 17.3 10*3/uL — ABNORMAL HIGH (ref 4.0–10.5)
nRBC: 0 % (ref 0.0–0.2)

## 2020-10-16 LAB — PREPARE RBC (CROSSMATCH)

## 2020-10-16 MED ORDER — ACETAMINOPHEN 325 MG PO TABS: 650.0000 mg | ORAL_TABLET | Freq: Once | ORAL | Status: DC

## 2020-10-16 MED ORDER — OXYCODONE HCL 10 MG PO TABS: 10.0000 mg | ORAL_TABLET | Freq: Four times a day (QID) | ORAL | 0 refills | Status: DC | PRN

## 2020-10-16 MED ORDER — DIPHENHYDRAMINE HCL 50 MG/ML IJ SOLN: 25.0000 mg | Freq: Once | INTRAMUSCULAR | Status: DC

## 2020-10-16 MED ORDER — METOPROLOL SUCCINATE ER 25 MG PO TB24: 12.5000 mg | ORAL_TABLET | Freq: Every day | ORAL | 1 refills | Status: DC

## 2020-10-16 MED ORDER — SODIUM CHLORIDE 0.9% IV SOLUTION: Freq: Once | INTRAVENOUS | Status: DC

## 2020-10-16 NOTE — Telephone Encounter (Signed)
Med refill

## 2020-10-16 NOTE — Discharge Summary (Signed)
Physician Discharge Summary  Carmen [name]:[id number] DOB: Apr 23, 1991 DOA: 10/13/2020  PCP: Vevelyn Francois, NP  Admit date: 10/13/2020  Discharge date: 10/16/2020  Discharge Diagnoses:  Principal Problem:   Sickle cell pain crisis ([place]) Active Problems:   Hypokalemia   Leukocytosis   Chronic prescription opiate use   Fever of unknown origin   Discharge Condition: Stable  Disposition:   Follow-up Information    Vevelyn Francois, NP Follow up in 1 week(s).   Specialty: Adult Health Nurse Practitioner Contact information: [address] [name] [address] [phone]              Pt is discharged home in good condition and is to follow up with Vevelyn Francois, NP this week to have labs evaluated. [name] is instructed to increase activity slowly and balance with rest for the next few days, and use prescribed medication to complete treatment of pain  Diet: Regular Wt Readings from Last 3 Encounters:  10/13/20 79.4 kg  09/10/20 81.6 kg  09/03/20 81.6 kg    History of present illness:  Carmen [name] is a 29 year old female with a medical history significant for sickle cell disease, chronic pain syndrome, opiate dependence and tolerance, and history of anemia of chronic disease presents to sickle cell day clinic complaining of generalized pain that is consistent with previous pain crisis.  Patient also endorses pain primarily to central chest.  Patient says that pain intensity has been elevated over the past 3 days and has been unrelieved by oxycodone.  She last had oxycodone 10 mg this a.m. without sustained relief.  She attributes current pain crisis to changes in weather and she has been out of her long-acting pain medication for around 48 hours.  Patient denies any subjective fever, chills, shortness of breath, or headache.  No urinary symptoms, nausea, vomiting, or diarrhea.  No sick contacts, recent travel, or exposure to COVID-19.  Sickle cell  clinic course:  Vital signs show:BP 100/61 (BP Location: Right Arm)   Pulse (!) 115   Temp 97.6 F (36.4 C) (Oral)   Resp 13   LMP 09/21/2020   SpO2 99%  Pulses ranged between 110-120, EKG shows sinus tachycardia.  WBCs elevated at 34.5.  Hemoglobin 9.8, consistent with patient's baseline.  Total bilirubin elevated at 1.4.  Potassium 3.0.  Chest x-ray pending, COVID-19 test pending.  Pain persists despite IV Dilaudid PCA, IV fluids, and IV Toradol.  Patient admitted in observation to [facility] for further management of sickle cell pain crisis.  Hospital Course:  Sickle cell disease with pain crisis:  Patient was admitted for sickle cell pain crisis and managed appropriately with IVF, IV Dilaudid via PCA and IV Toradol, as well as other adjunct therapies per sickle cell pain management protocols.  Patient states that she is not having any pain on today and is requesting discharge home. Patient advised to resume home medication regimen.  Medications are managed by PCP.  Patient will follow-up in 2 weeks.  Sickle cell anemia: Today, hemoglobin decreased to 6.6 which is below patient's baseline of 8-9 g/dL.  Patient has multiple antibodies and blood will not be ready until late tonight..  Patient is requesting to return to sickle cell clinic in a.m. for blood transfusion, she does not want to stay an additional night.  Patient scheduled to return to sickle cell day clinic at 10 AM on 10/17/2020 for transfusion of 1 unit PRBCs.  Fever of unknown origin:  Patient was periodically febrile throughout admission.  Initiated IV antibiotics.  Resolved.  White blood cell count trended down appropriately.  Tachycardia: EKG shows sinus tachycardia.  Heart rate remained 110s-120s throughout admission.  Initiate metoprolol 12.5 mg extended release.  Heart rate decreased to 90s.  Patient will discharge on metoprolol. Metoprolol 12.5 mg daily #30 was sent to patient's pharmacy without refills.  Patient will  follow-up with PCP for this problem.   Patient is afebrile and oxygen saturation is 98% on RA.  She is alert, oriented, and ambulating without assistance.  Patient was therefore discharged home today in a hemodynamically stable condition.   Kindel will follow-up with PCP within 1 week of this discharge. [name] was counseled extensively about nonpharmacologic means of pain management, patient verbalized understanding and was appreciative of  the care received during this admission.   We discussed the need for good hydration, monitoring of hydration status, avoidance of heat, cold, stress, and infection triggers.  Patient is not on any disease modifying agents.  Patient was reminded of the need to seek medical attention immediately if any symptom of bleeding, anemia, or infection occurs.  Discharge Exam  General appearance : Awake, alert, not in any distress. Speech Clear. Not toxic looking HEENT: Atraumatic and Normocephalic, pupils equally reactive to light and accomodation Neck: Supple, no JVD. No cervical lymphadenopathy.  Chest: Good air entry bilaterally, no added sounds  CVS: S1 S2 regular, no murmurs.  Abdomen: Bowel sounds present, Non tender and not distended with no gaurding, rigidity or rebound. Extremities: B/L Lower Ext shows no edema, both legs are warm to touch Neurology: Awake alert, and oriented X 3, CN II-XII intact, Non focal Skin: No Rash  Discharge Instructions  Discharge Instructions    Discharge patient   Complete by: As directed    Discharge disposition: 01-Home or Self Care   Discharge patient date: 10/16/2020     Allergies as of 10/16/2020   No Known Allergies     Medication List    TAKE these medications   acetaminophen 500 MG tablet Commonly known as: TYLENOL Take 1,000 mg by mouth every 6 (six) hours as needed for mild pain or headache.   Aspirin Low Dose 81 MG EC tablet Generic drug: aspirin Take 81 mg by mouth daily.   citalopram 10 MG  tablet Commonly known as: CeleXA Take 1 tablet (10 mg total) by mouth daily.   cyclobenzaprine 10 MG tablet Commonly known as: FLEXERIL TAKE 1 TABLET BY MOUTH TWICE A DAY What changed:   when to take this  reasons to take this   folic acid 1 MG tablet Commonly known as: FOLVITE Take 1 tablet (1 mg total) by mouth daily.   loratadine 10 MG tablet Commonly known as: CLARITIN Take 1 tablet (10 mg total) by mouth daily.   metoprolol succinate 25 MG 24 hr tablet Commonly known as: TOPROL-XL Take 0.5 tablets (12.5 mg total) by mouth daily. Start taking on: October 17, 2020   morphine 60 MG 12 hr tablet Commonly known as: MS Contin Take 1 tablet (60 mg total) by mouth every 12 (twelve) hours.   Oxycodone HCl 10 MG Tabs Take 1 tablet (10 mg total) by mouth every 6 (six) hours as needed for up to 15 days. What changed: reasons to take this   Prenatal Vitamin Plus Low Iron 27-1 MG Tabs TAKE 1 TABLET BY MOUTH EVERY DAY   Vitamin D (Ergocalciferol) 1.25 MG (50000 UNIT) Caps capsule Commonly known as: DRISDOL TAKE  ONE CAPSULE BY MOUTH ONE TIME PER WEEK What changed:   how much to take  how to take this  when to take this  additional instructions       The results of significant diagnostics from this hospitalization (including imaging, microbiology, ancillary and laboratory) are listed below for reference.    Significant Diagnostic Studies: DG Chest 2 View  Result Date: 10/14/2020 CLINICAL DATA:  Sickle cell crisis.  Pain. EXAM: CHEST - 2 VIEW COMPARISON:  10/13/2020 FINDINGS: Minimal bibasilar atelectasis unchanged. No acute infiltrate or effusion. Heart size and vascularity normal. IMPRESSION: No interval change minimal bibasilar atelectasis. Electronically Signed   By: Franchot Gallo M.D.   On: 10/14/2020 10:16   DG Chest 2 View  Result Date: 10/13/2020 CLINICAL DATA:  Sickle cell pain crisis. EXAM: CHEST - 2 VIEW COMPARISON:  Most recent radiograph 07/10/2020  FINDINGS: The heart is normal in size.The cardiomediastinal contours are normal. Subsegmental atelectasis at both lung bases. Pulmonary vasculature is normal. No consolidation, pleural effusion, or pneumothorax. No acute osseous abnormalities are seen. Thoracic spine changes related to sickle cell with central endplate depressions. IMPRESSION: Subsegmental bibasilar atelectasis. Electronically Signed   By: Keith Rake M.D.   On: 10/13/2020 22:46    Microbiology: Recent Results (from the past 240 hour(s))  Resp Panel by RT-PCR (Flu A&B, Covid) Nasopharyngeal Swab     Status: None   Collection Time: 10/13/20  5:29 PM   Specimen: Nasopharyngeal Swab; Nasopharyngeal(NP) swabs in vial transport medium  Result Value Ref Range Status   SARS Coronavirus 2 by RT PCR NEGATIVE NEGATIVE Final    Comment: (NOTE) SARS-CoV-2 target nucleic acids are NOT DETECTED.  The SARS-CoV-2 RNA is generally detectable in upper respiratory specimens during the acute phase of infection. The lowest concentration of SARS-CoV-2 viral copies this assay can detect is 138 copies/mL. A negative result does not preclude SARS-Cov-2 infection and should not be used as the sole basis for treatment or other patient management decisions. A negative result may occur with  improper specimen collection/handling, submission of specimen other than nasopharyngeal swab, presence of viral mutation(s) within the areas targeted by this assay, and inadequate number of viral copies(<138 copies/mL). A negative result must be combined with clinical observations, patient history, and epidemiological information. The expected result is Negative.  Fact Sheet for Patients:  EntrepreneurPulse.com.au  Fact Sheet for Healthcare Providers:  IncredibleEmployment.be  This test is no t yet approved or cleared by the [place] FDA and  has been authorized for detection and/or diagnosis of SARS-CoV-2 by FDA  under an Emergency Use Authorization (EUA). This EUA will remain  in effect (meaning this test can be used) for the duration of the COVID-19 declaration under Section 564(b)(1) of the Act, 21 U.S.C.section 360bbb-3(b)(1), unless the authorization is terminated  or revoked sooner.       Influenza A by PCR NEGATIVE NEGATIVE Final   Influenza B by PCR NEGATIVE NEGATIVE Final    Comment: (NOTE) The Xpert Xpress SARS-CoV-2/FLU/RSV plus assay is intended as an aid in the diagnosis of influenza from Nasopharyngeal swab specimens and should not be used as a sole basis for treatment. Nasal washings and aspirates are unacceptable for Xpert Xpress SARS-CoV-2/FLU/RSV testing.  Fact Sheet for Patients: EntrepreneurPulse.com.au  Fact Sheet for Healthcare Providers: IncredibleEmployment.be  This test is not yet approved or cleared by the [place] FDA and has been authorized for detection and/or diagnosis of SARS-CoV-2 by FDA under an Emergency Use Authorization (EUA). This EUA will  remain in effect (meaning this test can be used) for the duration of the COVID-19 declaration under Section 564(b)(1) of the Act, 21 U.S.C. section 360bbb-3(b)(1), unless the authorization is terminated or revoked.  Performed at [facility], [place] [address], [place], [place] [address]   Culture, blood (Routine X 2) w Reflex to ID Panel     Status: None (Preliminary result)   Collection Time: 10/14/20 11:57 AM   Specimen: BLOOD  Result Value Ref Range Status   Specimen Description   Final    BLOOD LEFT ARM Performed at [facility] [address] [place], [place] [address]    Special Requests   Final    BOTTLES DRAWN AEROBIC AND ANAEROBIC Blood Culture adequate volume Performed at [facility] [address] [place], [place] [address]    Culture   Final    NO GROWTH < 24 HOURS Performed at [facility] [address], [place], [place] [address]    Report Status PENDING  Incomplete     Labs: Basic Metabolic Panel: Recent Labs  Lab 10/13/20 0959 10/13/20 1525 10/14/20 1220 10/15/20 0440  NA 136  --  132* 133*  K 3.0*   < > 3.6 3.5  CL 97*  --  96* 97*  CO2 26  --  26 25  GLUCOSE 103*  --  94 93  BUN 11  --  17 16  CREATININE 0.82  --  0.94 0.82  CALCIUM 9.2  --  8.8* 8.7*   < > = values in this interval not displayed.   Liver Function Tests: Recent Labs  Lab 10/13/20 0959 10/14/20 1220  AST 20 40  ALT 25 37  ALKPHOS 79 76  BILITOT 1.4* 1.6*  PROT 8.9* 7.5  ALBUMIN 4.1 3.4*   No results for input(s): LIPASE, AMYLASE in the last 168 hours. No results for input(s): AMMONIA in the last 168 hours. CBC: Recent Labs  Lab 10/13/20 0959 10/14/20 1220 10/15/20 0440  WBC 34.5* 22.9* 22.3*  NEUTROABS 28.2* 16.7*  --   HGB 9.8* 7.6* 7.1*  HCT 27.2* 20.7* 19.8*  MCV 82.4 79.9* 80.8  PLT 456* 420* 437*   Cardiac Enzymes: No results for input(s): CKTOTAL, CKMB, CKMBINDEX, TROPONINI in the last 168 hours. BNP: Invalid input(s): POCBNP CBG: No results for input(s): GLUCAP in the last 168 hours.  Time coordinating discharge: 35 minutes  Signed:  [name]  APRN, MSN, FNP-C Patient [facility] [address] [place], [place] [address] [phone]   [facility] 10/16/2020, 12:01 PM

## 2020-10-16 NOTE — Progress Notes (Signed)
Chaplain engaged in follow-up visit with [name].  Chaplain offered support.    10/16/20 1100  Clinical Encounter Type  Visited With Patient  Visit Type Follow-up

## 2020-10-16 NOTE — TOC Initial Note (Signed)
Transition of Care [facility]) - Initial/Assessment Note    Patient Details  Name: Carmen [name] MRN: [id number] Date of Birth: [birth year]  Transition of [facility]) CM/SW Contact:    Lynnell Catalan, RN Phone Number: 10/16/2020, 10:05 AM  Clinical Narrative:                  Expected Discharge Plan: Home/Self Care Barriers to Discharge: Continued Medical Work up   Expected Discharge Plan and Services Expected Discharge Plan: Home/Self Care       Living arrangements for the past 2 months: Apartment                     Prior Living Arrangements/Services Living arrangements for the past 2 months: Apartment Lives with:: Parents Patient language and need for interpreter reviewed:: Yes        Need for Family Participation in Patient Care: Yes (Comment) Care giver support system in place?: Yes (comment)   Criminal Activity/Legal Involvement Pertinent to Current Situation/Hospitalization: No - Comment as needed  Activities of Daily Living Home Assistive Devices/Equipment: None ADL Screening (condition at time of admission) Patient's cognitive ability adequate to safely complete daily activities?: Yes Is the patient deaf or have difficulty hearing?: No Does the patient have difficulty seeing, even when wearing glasses/contacts?: No Does the patient have difficulty concentrating, remembering, or making decisions?: No Patient able to express need for assistance with ADLs?: Yes Does the patient have difficulty dressing or bathing?: No Independently performs ADLs?: Yes (appropriate for developmental age) Does the patient have difficulty walking or climbing stairs?: No Weakness of Legs: None Weakness of Arms/Hands: None  Admission diagnosis:  Sickle cell pain crisis [facility]) [D57.00] Patient Active Problem List   Diagnosis Date Noted  . Fever of unknown origin 10/14/2020  . Sickle cell crisis ([place]) 03/31/2020  . Bacterial vaginitis 01/04/2020  . Acute cystitis without hematuria    . Dysuria 12/31/2019  . Urine leukocytes   . Sickle cell pain crisis ([name]) 09/10/2019  . Sickle cell disease ([place]) 12/04/2018  . Alpha thalassemia silent carrier 12/04/2018  . Chronic prescription opiate use 03/13/2018  . Chronic musculoskeletal pain 03/13/2018  . Vitamin D deficiency 01/13/2018  . Leukocytosis 08/21/2017  . Hypokalemia 06/27/2017  . 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[place]) 04/05/2017  . Hb-SS disease without crisis ([place]) 08/15/2016  . Anemia of chronic disease   . Chest pain 11/10/2015  . Systolic ejection murmur 37/62/8315  . Sickle cell anemia of mother during pregnancy ([place]) 05/16/2014   PCP:  Vevelyn Francois, NP Pharmacy:   [id number] - [place], [place] [address] Phone: [phone] Fax: [phone] Readmission Risk Interventions Readmission Risk Prevention Plan 10/16/2020  Transportation Screening Complete  PCP or Specialist Appt within 3-5 Days Not Complete  Not Complete comments Not ready for dc yet  [place] or [place] Not Complete  HRI or Home Care Consult comments Not needed  Social Work Consult for [place] Planning/Counseling Complete  Palliative Care Screening Not Applicable  Medication Review (RN Care Manager) Complete  Some recent data might be hidden

## 2020-10-17 ENCOUNTER — Other Ambulatory Visit: Payer: Self-pay | Admitting: Family Medicine

## 2020-10-17 ENCOUNTER — Other Ambulatory Visit: Payer: Self-pay

## 2020-10-17 ENCOUNTER — Ambulatory Visit (HOSPITAL_COMMUNITY)
Admit: 2020-10-17 | Discharge: 2020-10-17 | Disposition: A | Discharge status: Home / Self Care | Payer: Medicaid Other | Source: Ambulatory Visit | Attending: Internal Medicine | Admitting: Internal Medicine

## 2020-10-17 DIAGNOSIS — D5700 Hb-SS disease with crisis, unspecified: Secondary | ICD-10-CM | POA: Diagnosis present

## 2020-10-17 LAB — CBC
HCT: 20.1 % — ABNORMAL LOW (ref 36.0–46.0)
Hemoglobin: 7.2 g/dL — ABNORMAL LOW (ref 12.0–15.0)
MCH: 28.7 pg (ref 26.0–34.0)
MCHC: 35.8 g/dL (ref 30.0–36.0)
MCV: 80.1 fL (ref 80.0–100.0)
Platelets: 682 10*3/uL — ABNORMAL HIGH (ref 150–400)
RBC: 2.51 MIL/uL — ABNORMAL LOW (ref 3.87–5.11)
RDW: 15 % (ref 11.5–15.5)
WBC: 16 10*3/uL — ABNORMAL HIGH (ref 4.0–10.5)
nRBC: 0.2 % (ref 0.0–0.2)

## 2020-10-17 LAB — BPAM RBC
Blood Product Expiration Date: 202201172359
Blood Product Expiration Date: 202201252359
Unit Type and Rh: 5100
Unit Type and Rh: 5100

## 2020-10-17 LAB — TYPE AND SCREEN
ABO/RH(D): O POS
Antibody Screen: NEGATIVE
Unit division: 0
Unit division: 0

## 2020-10-17 LAB — PREPARE RBC (CROSSMATCH)

## 2020-10-17 MED ORDER — ACETAMINOPHEN 325 MG PO TABS
650.0000 mg | ORAL_TABLET | Freq: Once | ORAL | Status: DC
  Filled 2020-10-17: qty 2

## 2020-10-17 MED ORDER — DIPHENHYDRAMINE HCL 50 MG/ML IJ SOLN
25.0000 mg | Freq: Once | INTRAMUSCULAR | Status: DC
  Filled 2020-10-17: qty 1

## 2020-10-17 MED ORDER — SODIUM CHLORIDE 0.9% IV SOLUTION: Freq: Once | INTRAVENOUS | Status: DC

## 2020-10-17 NOTE — Telephone Encounter (Signed)
Crystal I see that you have   refill this on yesterday , can you refuse this. I can't refuse this medication . Thanks

## 2020-10-17 NOTE — Progress Notes (Signed)
Patient released from the hospital yesterday with plans to come to the day hospital for blood transfusion today. Hemoglobin was 6.6 yesterday and was 7.2 upon arrival to the day hospital today. Blood bank was unable to find a compatible unit of blood for patient due to multiple antibodies.  Blood bank advised that patient reschedule appointment for blood and redraw Type & Screen. [place], [place] notified. Since Hemoglobin is now above 7.0, patient advised to follow up with PCP in two weeks. PCP will monitor labs and advise. Vital signs stable. Patient alert, oriented and ambulatory at discharge.

## 2020-10-19 LAB — CULTURE, BLOOD (ROUTINE X 2)
Culture: NO GROWTH
Special Requests: ADEQUATE

## 2020-10-21 ENCOUNTER — Encounter (HOSPITAL_COMMUNITY): Payer: Medicaid Other

## 2020-11-09 ENCOUNTER — Other Ambulatory Visit: Payer: Self-pay

## 2020-11-09 DIAGNOSIS — Z79891 Long term (current) use of opiate analgesic: Secondary | ICD-10-CM

## 2020-11-09 DIAGNOSIS — D571 Sickle-cell disease without crisis: Secondary | ICD-10-CM

## 2020-11-10 MED ORDER — OXYCODONE HCL 10 MG PO TABS: 10.0000 mg | ORAL_TABLET | Freq: Four times a day (QID) | ORAL | 0 refills | Status: DC | PRN

## 2020-11-11 ENCOUNTER — Other Ambulatory Visit: Payer: Self-pay

## 2020-11-11 DIAGNOSIS — D571 Sickle-cell disease without crisis: Secondary | ICD-10-CM

## 2020-11-11 DIAGNOSIS — F3341 Major depressive disorder, recurrent, in partial remission: Secondary | ICD-10-CM

## 2020-11-11 DIAGNOSIS — Z79891 Long term (current) use of opiate analgesic: Secondary | ICD-10-CM

## 2020-11-11 MED ORDER — CITALOPRAM HYDROBROMIDE 10 MG PO TABS
10.0000 mg | ORAL_TABLET | Freq: Every day | ORAL | 2 refills | Status: DC
Start: 2020-11-11 — End: 2021-01-28

## 2020-11-12 NOTE — Telephone Encounter (Signed)
Is this okay to refill? 

## 2020-11-13 MED ORDER — MORPHINE SULFATE ER 60 MG PO TBCR
60.0000 mg | EXTENDED_RELEASE_TABLET | Freq: Two times a day (BID) | ORAL | 0 refills | Status: DC
Start: 2020-11-13 — End: 2020-12-13

## 2020-12-01 ENCOUNTER — Other Ambulatory Visit: Payer: Self-pay

## 2020-12-01 DIAGNOSIS — Z79891 Long term (current) use of opiate analgesic: Secondary | ICD-10-CM

## 2020-12-01 DIAGNOSIS — D571 Sickle-cell disease without crisis: Secondary | ICD-10-CM

## 2020-12-01 MED ORDER — OXYCODONE HCL 10 MG PO TABS: 10.0000 mg | ORAL_TABLET | Freq: Four times a day (QID) | ORAL | 0 refills | Status: DC | PRN

## 2020-12-13 ENCOUNTER — Other Ambulatory Visit: Payer: Self-pay

## 2020-12-13 DIAGNOSIS — D571 Sickle-cell disease without crisis: Secondary | ICD-10-CM

## 2020-12-13 DIAGNOSIS — Z79891 Long term (current) use of opiate analgesic: Secondary | ICD-10-CM

## 2020-12-14 ENCOUNTER — Other Ambulatory Visit: Payer: Self-pay

## 2020-12-14 DIAGNOSIS — D571 Sickle-cell disease without crisis: Secondary | ICD-10-CM

## 2020-12-14 DIAGNOSIS — Z79891 Long term (current) use of opiate analgesic: Secondary | ICD-10-CM

## 2020-12-15 MED ORDER — OXYCODONE HCL 10 MG PO TABS: 10.0000 mg | ORAL_TABLET | Freq: Four times a day (QID) | ORAL | 0 refills | Status: DC | PRN

## 2020-12-15 MED ORDER — MORPHINE SULFATE ER 60 MG PO TBCR
60.0000 mg | EXTENDED_RELEASE_TABLET | Freq: Two times a day (BID) | ORAL | 0 refills | Status: DC
Start: 2020-12-19 — End: 2021-01-17

## 2020-12-18 ENCOUNTER — Other Ambulatory Visit: Payer: Self-pay | Admitting: Family Medicine

## 2020-12-29 ENCOUNTER — Other Ambulatory Visit: Payer: Self-pay

## 2020-12-29 DIAGNOSIS — D571 Sickle-cell disease without crisis: Secondary | ICD-10-CM

## 2020-12-29 DIAGNOSIS — Z79891 Long term (current) use of opiate analgesic: Secondary | ICD-10-CM

## 2020-12-31 ENCOUNTER — Telehealth: Payer: Self-pay

## 2020-12-31 ENCOUNTER — Telehealth: Payer: Self-pay | Admitting: Clinical

## 2020-12-31 ENCOUNTER — Encounter: Payer: Self-pay | Admitting: Nurse Practitioner

## 2020-12-31 ENCOUNTER — Telehealth (INDEPENDENT_AMBULATORY_CARE_PROVIDER_SITE_OTHER): Payer: Medicaid Other | Admitting: Nurse Practitioner

## 2020-12-31 ENCOUNTER — Other Ambulatory Visit: Payer: Self-pay

## 2020-12-31 DIAGNOSIS — D571 Sickle-cell disease without crisis: Secondary | ICD-10-CM

## 2020-12-31 DIAGNOSIS — Z79891 Long term (current) use of opiate analgesic: Secondary | ICD-10-CM

## 2020-12-31 NOTE — Telephone Encounter (Signed)
Med refill for oxycodone 10 mg

## 2020-12-31 NOTE — Telephone Encounter (Signed)
Please call her back.  She needs to be seen today in order to get any refills.  She needs the apt Add her on the my schedule at the end of the morning or at the end of the day thanks

## 2020-12-31 NOTE — Telephone Encounter (Signed)
Integrated Behavioral Health General Follow Up Note  12/31/2020 Name: Carmen [name] MRN: [id number] DOB: September 17, 1991 Carmen [name] is a 30 y.o. year old female who sees Vevelyn Francois, NP for primary care. LCSW was initially consulted to assist the patient with Mental Health Counseling and Resources.  Interpreter: No.   Interpreter Name & Language: none  Assessment: Patient experiencing stress related to living with chronic illness. Patient would also like to pursue a career.  Ongoing Intervention: Today patient was transferred to [place] for assistance with transportation to PCP appointment 01/28/21. CSW to schedule transportation to this appointment. Patient would also like to meet with CSW on the same day as PCP appointment. Patient expressed feeling a lack of direction and wants to pursue a career. CSW had referred patient to the [facility] for support on this some time ago, but patient has been unable to follow up and complete the intake with them due to transportation issues. CSW to follow and problem solve transportation issue with patient.  Review of patient status, including review of consultants reports, relevant laboratory and other test results, and collaboration with appropriate care team members and the patient's provider was performed as part of comprehensive patient evaluation and provision of services.     Follow up Plan: 1. CSW to follow and support with transportation needs 2. PCP and social work appointment 01/01/21  Estanislado Emms, [place] [facility] [phone]

## 2020-12-31 NOTE — Progress Notes (Signed)
   [place],   [address] Phone:  [phone]   Fax:  [phone] Virtual Visit via Telephone Note  I connected with Carmen [name] on 12/31/20 at  9:00 AM EST anable to get patient on the phone or via video visit.    Vevelyn Francois, NP

## 2021-01-02 ENCOUNTER — Other Ambulatory Visit: Payer: Self-pay

## 2021-01-02 DIAGNOSIS — Z79891 Long term (current) use of opiate analgesic: Secondary | ICD-10-CM

## 2021-01-02 DIAGNOSIS — D571 Sickle-cell disease without crisis: Secondary | ICD-10-CM

## 2021-01-03 ENCOUNTER — Other Ambulatory Visit: Payer: Self-pay

## 2021-01-03 DIAGNOSIS — D571 Sickle-cell disease without crisis: Secondary | ICD-10-CM

## 2021-01-03 DIAGNOSIS — Z79891 Long term (current) use of opiate analgesic: Secondary | ICD-10-CM

## 2021-01-05 ENCOUNTER — Telehealth: Payer: Self-pay | Admitting: Clinical

## 2021-01-05 NOTE — Telephone Encounter (Signed)
Integrated Behavioral Health General Follow Up Note  01/05/2021 Name: Carmen [name] MRN: [id number] DOB: [birth year] Carmen [name] is a 30 y.o. year old female who sees Vevelyn Francois, NP for primary care. LCSW was initially consulted to assist the patient with Mental Health Counseling and Resources.  Interpreter: No.   Interpreter Name & Language: none  Assessment: Patient experiencing stress related to living with chronic illness. Patient would also like to pursue a career.  Ongoing Intervention: CSW called patient to follow up on transportation issue. Patient was unable to find transportation to the [facility]) to follow up on referral CSW provided to their program some time ago. Patient [place] will cover the cost of her transportation to the [facility] for her intake; their services after intake are virtual. CSW called patient on 3/3, 3/4 and today and LVMs. Awaiting return call. CSW will follow and support.   Review of patient status, including review of consultants reports, relevant laboratory and other test results, and collaboration with appropriate care team members and the patient's provider was performed as part of comprehensive patient evaluation and provision of services.     Follow up Plan: 1. CSW to follow and support with transportation needs  Estanislado Emms, [place] [facility] [phone]

## 2021-01-05 NOTE — Telephone Encounter (Signed)
Please see refill request.

## 2021-01-07 MED ORDER — OXYCODONE HCL 10 MG PO TABS: 10.0000 mg | ORAL_TABLET | Freq: Four times a day (QID) | ORAL | 0 refills | Status: DC | PRN

## 2021-01-13 ENCOUNTER — Other Ambulatory Visit: Payer: Self-pay

## 2021-01-13 DIAGNOSIS — D571 Sickle-cell disease without crisis: Secondary | ICD-10-CM

## 2021-01-13 DIAGNOSIS — Z79891 Long term (current) use of opiate analgesic: Secondary | ICD-10-CM

## 2021-01-15 ENCOUNTER — Other Ambulatory Visit: Payer: Self-pay

## 2021-01-15 DIAGNOSIS — Z79891 Long term (current) use of opiate analgesic: Secondary | ICD-10-CM

## 2021-01-15 DIAGNOSIS — D571 Sickle-cell disease without crisis: Secondary | ICD-10-CM

## 2021-01-16 ENCOUNTER — Other Ambulatory Visit: Payer: Self-pay

## 2021-01-16 ENCOUNTER — Telehealth: Payer: Self-pay

## 2021-01-16 DIAGNOSIS — Z79891 Long term (current) use of opiate analgesic: Secondary | ICD-10-CM

## 2021-01-16 DIAGNOSIS — D571 Sickle-cell disease without crisis: Secondary | ICD-10-CM

## 2021-01-16 NOTE — Telephone Encounter (Signed)
Med refill morphine

## 2021-01-17 ENCOUNTER — Other Ambulatory Visit: Payer: Self-pay

## 2021-01-17 DIAGNOSIS — Z79891 Long term (current) use of opiate analgesic: Secondary | ICD-10-CM

## 2021-01-17 DIAGNOSIS — D571 Sickle-cell disease without crisis: Secondary | ICD-10-CM

## 2021-01-18 ENCOUNTER — Emergency Department (HOSPITAL_COMMUNITY): Payer: Medicaid Other

## 2021-01-18 ENCOUNTER — Encounter (HOSPITAL_COMMUNITY): Payer: Self-pay

## 2021-01-18 ENCOUNTER — Inpatient Hospital Stay (HOSPITAL_COMMUNITY)
Admission: EM | Admit: 2021-01-18 | Discharge: 2021-01-20 | DRG: 811 | Disposition: A | Discharge status: Home / Self Care | Payer: Medicaid Other | Attending: Internal Medicine | Admitting: Internal Medicine

## 2021-01-18 ENCOUNTER — Other Ambulatory Visit: Payer: Self-pay

## 2021-01-18 DIAGNOSIS — Z832 Family history of diseases of the blood and blood-forming organs and certain disorders involving the immune mechanism: Secondary | ICD-10-CM

## 2021-01-18 DIAGNOSIS — E559 Vitamin D deficiency, unspecified: Secondary | ICD-10-CM | POA: Diagnosis present

## 2021-01-18 DIAGNOSIS — Z9049 Acquired absence of other specified parts of digestive tract: Secondary | ICD-10-CM | POA: Diagnosis not present

## 2021-01-18 DIAGNOSIS — D5700 Hb-SS disease with crisis, unspecified: Principal | ICD-10-CM | POA: Diagnosis present

## 2021-01-18 DIAGNOSIS — G894 Chronic pain syndrome: Secondary | ICD-10-CM | POA: Diagnosis present

## 2021-01-18 DIAGNOSIS — F17210 Nicotine dependence, cigarettes, uncomplicated: Secondary | ICD-10-CM | POA: Diagnosis present

## 2021-01-18 DIAGNOSIS — D5701 Hb-SS disease with acute chest syndrome: Secondary | ICD-10-CM | POA: Diagnosis not present

## 2021-01-18 DIAGNOSIS — Z79891 Long term (current) use of opiate analgesic: Secondary | ICD-10-CM

## 2021-01-18 DIAGNOSIS — Z20822 Contact with and (suspected) exposure to covid-19: Secondary | ICD-10-CM | POA: Diagnosis present

## 2021-01-18 DIAGNOSIS — Z9081 Acquired absence of spleen: Secondary | ICD-10-CM

## 2021-01-18 DIAGNOSIS — Z7982 Long term (current) use of aspirin: Secondary | ICD-10-CM | POA: Diagnosis not present

## 2021-01-18 DIAGNOSIS — F419 Anxiety disorder, unspecified: Secondary | ICD-10-CM | POA: Diagnosis present

## 2021-01-18 DIAGNOSIS — D638 Anemia in other chronic diseases classified elsewhere: Secondary | ICD-10-CM | POA: Diagnosis present

## 2021-01-18 DIAGNOSIS — D75838 Other thrombocytosis: Secondary | ICD-10-CM | POA: Diagnosis present

## 2021-01-18 DIAGNOSIS — Z79899 Other long term (current) drug therapy: Secondary | ICD-10-CM

## 2021-01-18 DIAGNOSIS — F329 Major depressive disorder, single episode, unspecified: Secondary | ICD-10-CM | POA: Diagnosis present

## 2021-01-18 DIAGNOSIS — J189 Pneumonia, unspecified organism: Secondary | ICD-10-CM | POA: Diagnosis present

## 2021-01-18 DIAGNOSIS — D571 Sickle-cell disease without crisis: Secondary | ICD-10-CM

## 2021-01-18 LAB — CBC WITH DIFFERENTIAL/PLATELET
Abs Immature Granulocytes: 0.02 10*3/uL (ref 0.00–0.07)
Basophils Absolute: 0.1 10*3/uL (ref 0.0–0.1)
Basophils Relative: 1 %
Eosinophils Absolute: 0.1 10*3/uL (ref 0.0–0.5)
Eosinophils Relative: 1 %
HCT: 26.9 % — ABNORMAL LOW (ref 36.0–46.0)
Hemoglobin: 9.6 g/dL — ABNORMAL LOW (ref 12.0–15.0)
Immature Granulocytes: 0 %
Lymphocytes Relative: 29 %
Lymphs Abs: 2.2 10*3/uL (ref 0.7–4.0)
MCH: 29.9 pg (ref 26.0–34.0)
MCHC: 35.7 g/dL (ref 30.0–36.0)
MCV: 83.8 fL (ref 80.0–100.0)
Monocytes Absolute: 0.8 10*3/uL (ref 0.1–1.0)
Monocytes Relative: 10 %
Neutro Abs: 4.6 10*3/uL (ref 1.7–7.7)
Neutrophils Relative %: 59 %
Platelets: 650 10*3/uL — ABNORMAL HIGH (ref 150–400)
RBC: 3.21 MIL/uL — ABNORMAL LOW (ref 3.87–5.11)
RDW: 13.8 % (ref 11.5–15.5)
WBC: 7.8 10*3/uL (ref 4.0–10.5)
nRBC: 0.3 % — ABNORMAL HIGH (ref 0.0–0.2)

## 2021-01-18 LAB — RETICULOCYTES
Immature Retic Fract: 35.9 % — ABNORMAL HIGH (ref 2.3–15.9)
RBC.: 3.21 MIL/uL — ABNORMAL LOW (ref 3.87–5.11)
Retic Count, Absolute: 166.6 10*3/uL (ref 19.0–186.0)
Retic Ct Pct: 5.2 % — ABNORMAL HIGH (ref 0.4–3.1)

## 2021-01-18 LAB — COMPREHENSIVE METABOLIC PANEL
ALT: 14 U/L (ref 0–44)
AST: 17 U/L (ref 15–41)
Albumin: 4.5 g/dL (ref 3.5–5.0)
Alkaline Phosphatase: 48 U/L (ref 38–126)
Anion gap: 8 (ref 5–15)
BUN: 10 mg/dL (ref 6–20)
CO2: 23 mmol/L (ref 22–32)
Calcium: 9.9 mg/dL (ref 8.9–10.3)
Chloride: 110 mmol/L (ref 98–111)
Creatinine, Ser: 0.46 mg/dL (ref 0.44–1.00)
GFR, Estimated: >60 mL/min (ref >60)
Glucose, Bld: 102 mg/dL — ABNORMAL HIGH (ref 70–99)
Potassium: 3.5 mmol/L (ref 3.5–5.1)
Sodium: 141 mmol/L (ref 135–145)
Total Bilirubin: 1.2 mg/dL (ref 0.3–1.2)
Total Protein: 8.3 g/dL — ABNORMAL HIGH (ref 6.5–8.1)

## 2021-01-18 LAB — LACTATE DEHYDROGENASE: LDH: 147 U/L (ref 98–192)

## 2021-01-18 LAB — RESP PANEL BY RT-PCR (FLU A&B, COVID) ARPGX2
Influenza A by PCR: NEGATIVE
Influenza B by PCR: NEGATIVE
SARS Coronavirus 2 by RT PCR: NEGATIVE

## 2021-01-18 LAB — I-STAT BETA HCG BLOOD, ED (MC, WL, AP ONLY): I-stat hCG, quantitative: <5 m[IU]/mL (ref <5)

## 2021-01-18 MED ORDER — SODIUM CHLORIDE 0.9 % IV SOLN: 1.0000 g | INTRAVENOUS | Status: DC

## 2021-01-18 MED ORDER — HYDROMORPHONE 1 MG/ML IV SOLN
INTRAVENOUS | Status: DC
  Administered 2021-01-18: 9 mg via INTRAVENOUS
  Administered 2021-01-18: 30 mg via INTRAVENOUS
  Administered 2021-01-19: 5.5 mg via INTRAVENOUS
  Administered 2021-01-19 (×2): 4 mg via INTRAVENOUS
  Administered 2021-01-19: 30 mg via INTRAVENOUS
  Administered 2021-01-19: 5 mg via INTRAVENOUS
  Administered 2021-01-19: 2.5 mg via INTRAVENOUS
  Administered 2021-01-19: 6.5 mg via INTRAVENOUS
  Administered 2021-01-20: 4 mg via INTRAVENOUS
  Administered 2021-01-20: 3 mg via INTRAVENOUS
  Administered 2021-01-20: 3.5 mg via INTRAVENOUS
  Administered 2021-01-20: 3 mg via INTRAVENOUS
  Filled 2021-01-18 (×2): qty 30

## 2021-01-18 MED ORDER — SENNOSIDES-DOCUSATE SODIUM 8.6-50 MG PO TABS
1.0000 | ORAL_TABLET | Freq: Two times a day (BID) | ORAL | Status: DC
  Administered 2021-01-18 – 2021-01-20 (×5): 1 via ORAL
  Filled 2021-01-18 (×5): qty 1

## 2021-01-18 MED ORDER — HYDROMORPHONE HCL 2 MG/ML IJ SOLN
2.0000 mg | Freq: Once | INTRAMUSCULAR | Status: AC
Start: 2021-01-18 — End: 2021-01-18
  Administered 2021-01-18: 2 mg via SUBCUTANEOUS
  Filled 2021-01-18: qty 1

## 2021-01-18 MED ORDER — SODIUM CHLORIDE 0.9% FLUSH: 9.0000 mL | INTRAVENOUS | Status: DC | PRN

## 2021-01-18 MED ORDER — METOPROLOL SUCCINATE ER 25 MG PO TB24
12.5000 mg | ORAL_TABLET | Freq: Every day | ORAL | Status: DC
  Administered 2021-01-18 – 2021-01-20 (×3): 12.5 mg via ORAL
  Filled 2021-01-18: qty 0.5
  Filled 2021-01-18 (×3): qty 1

## 2021-01-18 MED ORDER — ONDANSETRON HCL 4 MG/2ML IJ SOLN
4.0000 mg | Freq: Four times a day (QID) | INTRAMUSCULAR | Status: DC | PRN
  Administered 2021-01-18 – 2021-01-20 (×4): 4 mg via INTRAVENOUS
  Filled 2021-01-18 (×4): qty 2

## 2021-01-18 MED ORDER — HYDROMORPHONE HCL 1 MG/ML IJ SOLN
1.0000 mg | Freq: Once | INTRAMUSCULAR | Status: DC
Start: 2021-01-18 — End: 2021-01-18

## 2021-01-18 MED ORDER — AZITHROMYCIN 250 MG PO TABS
500.0000 mg | ORAL_TABLET | Freq: Every day | ORAL | Status: DC
  Administered 2021-01-19 – 2021-01-20 (×2): 500 mg via ORAL
  Filled 2021-01-18 (×2): qty 2

## 2021-01-18 MED ORDER — MORPHINE SULFATE ER 30 MG PO TBCR
60.0000 mg | EXTENDED_RELEASE_TABLET | Freq: Two times a day (BID) | ORAL | Status: DC
Start: 2021-01-18 — End: 2021-01-20
  Administered 2021-01-18 – 2021-01-20 (×4): 60 mg via ORAL
  Filled 2021-01-18 (×4): qty 2

## 2021-01-18 MED ORDER — HYDROMORPHONE HCL 2 MG/ML IJ SOLN
2.0000 mg | INTRAMUSCULAR | Status: AC
  Administered 2021-01-18: 2 mg via SUBCUTANEOUS
  Filled 2021-01-18: qty 1

## 2021-01-18 MED ORDER — SODIUM CHLORIDE 0.9 % IV SOLN
25.0000 mg | INTRAVENOUS | Status: DC | PRN
  Filled 2021-01-18: qty 0.5

## 2021-01-18 MED ORDER — NALOXONE HCL 0.4 MG/ML IJ SOLN: 0.4000 mg | INTRAMUSCULAR | Status: DC | PRN

## 2021-01-18 MED ORDER — DIPHENHYDRAMINE HCL 25 MG PO CAPS
25.0000 mg | ORAL_CAPSULE | ORAL | Status: DC | PRN
  Filled 2021-01-18: qty 1

## 2021-01-18 MED ORDER — SODIUM CHLORIDE 0.9 % IV SOLN
1.0000 g | INTRAVENOUS | Status: DC
  Administered 2021-01-19 – 2021-01-20 (×2): 1 g via INTRAVENOUS
  Filled 2021-01-18 (×2): qty 1

## 2021-01-18 MED ORDER — SODIUM CHLORIDE 0.9 % IV SOLN
500.0000 mg | Freq: Once | INTRAVENOUS | Status: AC
  Administered 2021-01-18: 500 mg via INTRAVENOUS
  Filled 2021-01-18: qty 500

## 2021-01-18 MED ORDER — SODIUM CHLORIDE 0.9 % IV BOLUS
1000.0000 mL | Freq: Once | INTRAVENOUS | Status: AC
  Administered 2021-01-18: 1000 mL via INTRAVENOUS

## 2021-01-18 MED ORDER — DEXTROSE-NACL 5-0.45 % IV SOLN: INTRAVENOUS | Status: DC

## 2021-01-18 MED ORDER — DIPHENHYDRAMINE HCL 50 MG/ML IJ SOLN
25.0000 mg | Freq: Once | INTRAMUSCULAR | Status: AC
Start: 2021-01-18 — End: 2021-01-18
  Administered 2021-01-18: 25 mg via INTRAVENOUS
  Filled 2021-01-18: qty 1

## 2021-01-18 MED ORDER — POLYETHYLENE GLYCOL 3350 17 G PO PACK: 17.0000 g | PACK | Freq: Every day | ORAL | Status: DC | PRN

## 2021-01-18 MED ORDER — FOLIC ACID 1 MG PO TABS
1.0000 mg | ORAL_TABLET | Freq: Every day | ORAL | Status: DC
  Administered 2021-01-18 – 2021-01-20 (×3): 1 mg via ORAL
  Filled 2021-01-18 (×3): qty 1

## 2021-01-18 MED ORDER — ENOXAPARIN SODIUM 40 MG/0.4ML ~~LOC~~ SOLN
40.0000 mg | SUBCUTANEOUS | Status: DC
  Administered 2021-01-18 – 2021-01-19 (×2): 40 mg via SUBCUTANEOUS
  Filled 2021-01-18 (×2): qty 0.4

## 2021-01-18 MED ORDER — ONDANSETRON HCL 4 MG/2ML IJ SOLN
4.0000 mg | INTRAMUSCULAR | Status: DC | PRN
  Administered 2021-01-18: 4 mg via INTRAVENOUS
  Filled 2021-01-18: qty 2

## 2021-01-18 MED ORDER — SODIUM CHLORIDE 0.9 % IV SOLN
1.0000 g | Freq: Once | INTRAVENOUS | Status: AC
  Administered 2021-01-18: 1 g via INTRAVENOUS
  Filled 2021-01-18: qty 10

## 2021-01-18 MED ORDER — KETOROLAC TROMETHAMINE 15 MG/ML IJ SOLN
15.0000 mg | Freq: Four times a day (QID) | INTRAMUSCULAR | Status: DC
  Administered 2021-01-18 – 2021-01-20 (×9): 15 mg via INTRAVENOUS
  Filled 2021-01-18 (×10): qty 1

## 2021-01-18 NOTE — ED Triage Notes (Signed)
Pt presents from home via [facility], c/o back, chest and bilateral leg pain starting Wednesday getting worse. Pt states she can usually manage pain at home with her home meds but states it wasn't working. Denies fevers

## 2021-01-18 NOTE — H&P (Signed)
H&P  Patient Demographics:  Carmen [name], is a 30 y.o. female  MRN: [id number]   DOB - Feb 14, 1991  Admit Date - 01/18/2021  Outpatient Primary MD for the patient is Vevelyn Francois, NP  Chief Complaint  Patient presents with  . Sickle Cell Pain Crisis     HPI:   Carmen [name]  is a 30 y.o. female with a medical history of sickle cell disease, chronic pain syndrome opiate dependence and tolerance and history of anemia of chronic disease who presents to the ED today with major complaints of generalized body pain that is typical of her sickle cell pain crisis.  She also complained of some right-sided chest pain with occasional cough.  Patient rates her pain at 10/10.  She ran out of her home pain medications since last Friday and was unable to get relief from home regimen.  She has no fever.  She has no chills.  She denies any shortness of breath or headache.  She denies any urinary symptoms.  No nausea, vomiting or diarrhea.  She denies any sick contact, recent travel or exposure to COVID-19 patients.  ED course: Updated Vital Signs BP 97/60   Pulse 71   Temp 98.4 F (36.9 C) (Oral)   Resp (!) 21   Ht 5\' 3"  (1.6 m)   Wt 78.5 kg   LMP 12/22/2020 (Exact Date)   SpO2 95%   BMI 30.65 kg/m.  Hemoglobin is 9.6, white cell count of 7.8 with a platelet of 650.  Of note patient has chronic thrombocytosis.  Comprehensive metabolic panel is unremarkable.  Total bilirubin is normal.  Chest x-ray was ordered which showed abnormal right lung base opacity new since December, may represent bronchopneumonia or acute chest syndrome in the setting of sickle cell disease.  No pleural effusion.  Review of systems:  In addition to the HPI above, patient reports No fever or chills No Headache, No changes with vision or hearing No problems swallowing food or liquids No chest pain, cough or shortness of breath No abdominal pain, No nausea or vomiting, Bowel movements are regular No blood in stool or urine No  dysuria No new skin rashes or bruises No new joints pains-aches No new weakness, tingling, numbness in any extremity No recent weight gain or loss No polyuria, polydypsia or polyphagia No significant Mental Stressors  A full 10 point Review of Systems was done, except as stated above, all other Review of Systems were negative.  With Past History of the following :   Past Medical History:  Diagnosis Date  . Anxiety   . Headache(784.0)   . Heart murmur   . Sickle cell crisis ([place])   . Syphilis 2015   Was diagnosed and received one injection of antibiotics  . Thrombocytosis 11/22/2014     CBC Latest Ref Rng & Units 12/13/2018 12/11/2018 12/10/2018 WBC 4.0 - 10.5 K/uL 14.8(H) 13.2(H) 15.9(H) Hemoglobin 12.0 - 15.0 g/dL 8.2(L) 7.7(L) 8.1(L) Hematocrit 36.0 - 46.0 % 23.7(L) 23.2(L) 24.5(L) Platelets 150 - 400 K/uL 326 399 388        Past Surgical History:  Procedure Laterality Date  . CESAREAN SECTION N/A 02/05/2019   Procedure: CESAREAN SECTION;  Surgeon: [name], MD;  Location: [facility] LD ORS;  Service: Obstetrics;  Laterality: N/A;  . CHOLECYSTECTOMY N/A 11/30/2014   Procedure: LAPAROSCOPIC CHOLECYSTECTOMY SINGLE SITE WITH INTRAOPERATIVE CHOLANGIOGRAM;  Surgeon: Michael Boston, MD;  Location: [facility] ORS;  Service: General;  Laterality: N/A;  . SPLENECTOMY  Social History:   Social History   Tobacco Use  . Smoking status: Current Some Day Smoker    Types: Cigarettes  . Smokeless tobacco: Never Used  Substance Use Topics  . Alcohol use: No     Lives - At home   Family History :   Family History  Problem Relation Age of Onset  . Hypertension Mother   . Sickle cell anemia Sister   . Kidney disease Sister        Lupus  . Arthritis Sister   . Sickle cell anemia Sister   . Sickle cell trait Sister   . Heart disease Maternal Aunt        CABG  . Heart disease Maternal Uncle        CABG  . Lupus Sister      Home Medications:   Prior to Admission medications    Medication Sig Start Date End Date Taking? Authorizing Provider  ASPIRIN LOW DOSE 81 MG EC tablet Take 81 mg by mouth daily. 08/01/20  Yes [provider]  cyclobenzaprine (FLEXERIL) 10 MG tablet TAKE 1 TABLET BY MOUTH TWICE A DAY Patient taking differently: Take 10 mg by mouth daily as needed for muscle spasms. 08/07/20  Yes Vevelyn Francois, NP  folic acid (FOLVITE) 1 MG tablet Take 1 tablet (1 mg total) by mouth daily. 03/15/19  Yes Lanae Boast, FNP  metoprolol succinate (TOPROL-XL) 25 MG 24 hr tablet TAKE 1/2 TABLET BY MOUTH EVERY DAY Patient taking differently: Take by mouth daily. 12/18/20  Yes Vevelyn Francois, NP  morphine (MS CONTIN) 60 MG 12 hr tablet Take 1 tablet (60 mg total) by mouth every 12 (twelve) hours. 12/19/20 01/18/21 Yes Vevelyn Francois, NP  Oxycodone HCl 10 MG TABS Take 1 tablet (10 mg total) by mouth every 6 (six) hours as needed for up to 15 days. 01/07/21 01/22/21 Yes Vevelyn Francois, NP  Prenatal Vit-Fe Fumarate-FA (PRENATAL VITAMIN PLUS LOW IRON) 27-1 MG TABS TAKE 1 TABLET BY MOUTH EVERY DAY Patient taking differently: Take 1 tablet by mouth daily. 03/03/20  Yes Shelly Bombard, MD  Vitamin D, Ergocalciferol, (DRISDOL) 1.25 MG (50000 UNIT) CAPS capsule TAKE ONE CAPSULE BY MOUTH ONE TIME PER WEEK Patient taking differently: Take 50,000 Units by mouth every 7 (seven) days. 09/03/20  Yes Vevelyn Francois, NP  citalopram (CELEXA) 10 MG tablet Take 1 tablet (10 mg total) by mouth daily. Patient not taking: Reported on 01/18/2021 11/11/20 02/09/21  Azzie Glatter, FNP     Allergies:   No Known Allergies   Physical Exam:   Vitals:   Vitals:   01/18/21 1306 01/18/21 1345  BP: 121/89 120/64  Pulse: 79 77  Resp: 16 15  Temp:    SpO2: 99% 98%   Physical Exam: Constitutional: Patient appears well-developed and well-nourished. Not in obvious distress. HENT: Normocephalic, atraumatic, External right and left ear normal. Oropharynx is clear and moist.  Eyes:  Conjunctivae and EOM are normal. PERRLA, no scleral icterus. Neck: Normal ROM. Neck supple. No JVD. No tracheal deviation. No thyromegaly. CVS: RRR, S1/S2 +, no murmurs, no gallops, no carotid bruit.  Pulmonary: Effort and breath sounds normal, no stridor, rhonchi, wheezes, rales.  Abdominal: Soft. BS +, no distension, tenderness, rebound or guarding.  Musculoskeletal: Normal range of motion. No edema and no tenderness.  Lymphadenopathy: No lymphadenopathy noted, cervical, inguinal or axillary Neuro: Alert. Normal reflexes, muscle tone coordination. No cranial nerve deficit. Skin: Skin is warm and dry.  No rash noted. Not diaphoretic. No erythema. No pallor. Psychiatric: Normal mood and affect. Behavior, judgment, thought content normal.   Data Review:   CBC Recent Labs  Lab 01/18/21 0702  WBC 7.8  HGB 9.6*  HCT 26.9*  PLT 650*  MCV 83.8  MCH 29.9  MCHC 35.7  RDW 13.8  LYMPHSABS 2.2  MONOABS 0.8  EOSABS 0.1  BASOSABS 0.1   ------------------------------------------------------------------------------------------------------------------  Chemistries  Recent Labs  Lab 01/18/21 0702  NA 141  K 3.5  CL 110  CO2 23  GLUCOSE 102*  BUN 10  CREATININE 0.46  CALCIUM 9.9  AST 17  ALT 14  ALKPHOS 48  BILITOT 1.2   ------------------------------------------------------------------------------------------------------------------ estimated creatinine clearance is 104.7 mL/min (by C-G formula based on SCr of 0.46 mg/dL). ------------------------------------------------------------------------------------------------------------------ No results for input(s): TSH, T4TOTAL, T3FREE, THYROIDAB in the last 72 hours.  Invalid input(s): FREET3  Coagulation profile No results for input(s): INR, PROTIME in the last 168 hours. ------------------------------------------------------------------------------------------------------------------- No results for input(s): DDIMER in the last  72 hours. -------------------------------------------------------------------------------------------------------------------  Cardiac Enzymes No results for input(s): CKMB, TROPONINI, MYOGLOBIN in the last 168 hours.  Invalid input(s): CK ------------------------------------------------------------------------------------------------------------------ No results found for: BNP  ---------------------------------------------------------------------------------------------------------------  Urinalysis    Component Value Date/Time   COLORURINE YELLOW 09/08/2020 1030   APPEARANCEUR HAZY (A) 09/08/2020 1030   LABSPEC 1.013 09/08/2020 1030   PHURINE 6.0 09/08/2020 1030   GLUCOSEU NEGATIVE 09/08/2020 1030   HGBUR NEGATIVE 09/08/2020 1030   BILIRUBINUR NEGATIVE 09/08/2020 1030   BILIRUBINUR Negative 08/10/2019 1534   KETONESUR NEGATIVE 09/08/2020 1030   PROTEINUR NEGATIVE 09/08/2020 1030   UROBILINOGEN 1.0 08/10/2019 1534   UROBILINOGEN 1.0 01/12/2018 0943   NITRITE NEGATIVE 09/08/2020 1030   LEUKOCYTESUR NEGATIVE 09/08/2020 1030    ----------------------------------------------------------------------------------------------------------------   Imaging Results:    DG Chest Port 1 View  Result Date: 01/18/2021 CLINICAL DATA:  30 year old female sickle cell disease. Chest pain. Lower extremity swelling for 4 days. EXAM: PORTABLE CHEST 1 VIEW COMPARISON:  Chest radiographs 10/14/2020 and earlier. FINDINGS: Portable AP semi upright view at 0731 hours. Mildly lower lung volumes. Mediastinal contours remain normal. Visualized tracheal air column is within normal limits. No pneumothorax, pulmonary edema or pleural effusion. Streaky peribronchial opacity at the right lung base tenting the inferior pulmonary ligament or diaphragm there is new since December. Paucity of bowel gas in the upper abdomen. Visible osseous structures appear within normal limits. IMPRESSION: Abnormal right lung base  opacity new since December may represent bronchopneumonia or acute chest syndrome in the setting of sickle cell disease. No pleural effusion. Electronically Signed   By: Genevie Ann M.D.   On: 01/18/2021 07:44     Assessment & Plan:  Principal Problem:   Acute chest syndrome [facility]) Active Problems:   Anemia of chronic disease  1. Acute Chest Syndrome: Patient is saturating well on room air, no shortness of breath, no fever. We will treat with IV ceftriaxone and azithromycin.  Placed on oxygen. Treat sickle cell disease related pain per protocol. There is no clinical indication for exchange transfusion today based on clinical findings.  Patient is hemodynamically stable. 2. Hb Sickle Cell Disease with crisis: Start IVF D5 .45% Saline @ 125 mls/hour, start weight based Dilaudid PCA, start IV Toradol 15 mg Q 6 H, Restart oral home pain medications, Monitor vitals very closely, Re-evaluate pain scale regularly, 2 L of Oxygen by [place], Patient will be re-evaluated for pain in the context of function and relationship to baseline as care progresses.  3. Sickle Cell Anemia: Hemoglobin is stable at baseline. There is no clinical indication blood transfusion today. Continue to monitor very closely and transfuse as needed. 4. Chronic pain Syndrome: Restart long-acting home pain medication.  Hold short acting for now until patient is successfully transitioned to full oral pain medications. 5. Major Depressive Disorder: Patient is clinically stable today. She denies any suicidal ideations or thoughts. We will monitor very closely.  Continue home medications.  DVT Prophylaxis: Subcut Lovenox   AM Labs Ordered, also please review Full Orders  Family Communication: Admission, patient's condition and plan of care including tests being ordered have been discussed with the patient who indicate understanding and agree with the plan and Code Status.  Code Status: Full Code  Consults called: None    Admission status:  Inpatient    Time spent in minutes : 50 minutes  Angelica Chessman MD, MHA, CPE, [place] 01/18/2021 at 2:05 PM

## 2021-01-18 NOTE — ED Notes (Signed)
Unsuccessful IV attempt x2, will perform [place] IV at this time

## 2021-01-18 NOTE — ED Provider Notes (Signed)
[facility] DEPT Provider Note   CSN: [id number] Arrival date & time: 01/18/21  7619    History Chief Complaint  Patient presents with  . Sickle Cell Pain Crisis    Carmen [name] is a 30 y.o. female with past medical history significant for sickle cell, syphilis, thrombocytosis presents for evaluation of pain consistent with her prior sickle cell crises.  Patient has pain to her lower back, bilateral lower extremities.  States when she occasionally moves the pain will shoot up her back.  Triage note notes chest pain however patient denies this, States intermittently occurs and has not current pain.  She denies any unilateral leg swelling, redness or warmth.  She has been taking her home medications without relief. She rates her pain a 10/10.  She denies any fever, chills, nausea, vomiting, chest pain, shortness of breath, hemoptysis, abdominal pain, diarrhea, dysuria. Does have chronic cough over last 3 weeks, States she thought this was "allergies."  Denies additional aggravating or alleviating factors.  Last hospital admission in October. Took last of Morphine meds yesterday evening.  Denies additional aggravating or alleviating factors.  No history IV drug use, bowel or bladder incontinence, saddle paresthesia.  Follows with [facility] here at [facility].  History obtained from patient and past medical records.  No interpreter used  HPI     Past Medical History:  Diagnosis Date  . Anxiety   . Headache(784.0)   . Heart murmur   . Sickle cell crisis ([place])   . Syphilis 2015   Was diagnosed and received one injection of antibiotics  . Thrombocytosis 11/22/2014     CBC Latest Ref Rng & Units 12/13/2018 12/11/2018 12/10/2018 WBC 4.0 - 10.5 K/uL 14.8(H) 13.2(H) 15.9(H) Hemoglobin 12.0 - 15.0 g/dL 8.2(L) 7.7(L) 8.1(L) Hematocrit 36.0 - 46.0 % 23.7(L) 23.2(L) 24.5(L) Platelets 150 - 400 K/uL 326 399 388      Patient Active Problem List   Diagnosis Date Noted  . Fever of  unknown origin 10/14/2020  . Sickle cell crisis ([place]) 03/31/2020  . Bacterial vaginitis 01/04/2020  . Acute cystitis without hematuria   . Dysuria 12/31/2019  . Urine leukocytes   . Sickle cell pain crisis ([place]) 09/10/2019  . Sickle cell disease ([place]) 12/04/2018  . Alpha thalassemia silent carrier 12/04/2018  . Chronic prescription opiate use 03/13/2018  . Chronic musculoskeletal pain 03/13/2018  . Vitamin D deficiency 01/13/2018  . Leukocytosis 08/21/2017  . Hypokalemia 06/27/2017  . 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[place]) 04/05/2017  . Hb-SS disease without crisis ([place]) 08/15/2016  . Anemia of chronic disease   . Chest pain 11/10/2015  . Systolic ejection murmur 50/93/2671  . Sickle cell anemia of mother during pregnancy [facility]) 05/16/2014    Past Surgical History:  Procedure Laterality Date  . CESAREAN SECTION N/A 02/05/2019   Procedure: CESAREAN SECTION;  Surgeon: [name], MD;  Location: [facility] LD ORS;  Service: Obstetrics;  Laterality: N/A;  . CHOLECYSTECTOMY N/A 11/30/2014   Procedure: LAPAROSCOPIC CHOLECYSTECTOMY SINGLE SITE WITH INTRAOPERATIVE CHOLANGIOGRAM;  Surgeon: Michael Boston, MD;  Location: [facility] ORS;  Service: General;  Laterality: N/A;  . SPLENECTOMY       OB History    Gravida  2   Para  1   Term  1   Preterm      AB  1   Living  1     SAB      IAB  1   Ectopic      Multiple  0   Live Births  1           Family History  Problem Relation Age of Onset  . Hypertension Mother   . Sickle cell anemia Sister   . Kidney disease Sister        Lupus  . Arthritis Sister   . Sickle cell anemia Sister   . Sickle cell trait Sister   . Heart disease Maternal Aunt        CABG  . Heart disease Maternal Uncle        CABG  . Lupus Sister     Social History   Tobacco Use  . Smoking status: Current Some Day Smoker    Types: Cigarettes  . Smokeless tobacco: Never Used  Vaping Use  . Vaping Use: Never used  Substance Use Topics  .  Alcohol use: No  . Drug use: No    Home Medications Prior to Admission medications   Medication Sig Start Date End Date Taking? Authorizing Provider  ASPIRIN LOW DOSE 81 MG EC tablet Take 81 mg by mouth daily. 08/01/20  Yes [provider]  cyclobenzaprine (FLEXERIL) 10 MG tablet TAKE 1 TABLET BY MOUTH TWICE A DAY Patient taking differently: Take 10 mg by mouth daily as needed for muscle spasms. 08/07/20  Yes Vevelyn Francois, NP  folic acid (FOLVITE) 1 MG tablet Take 1 tablet (1 mg total) by mouth daily. 03/15/19  Yes Lanae Boast, FNP  metoprolol succinate (TOPROL-XL) 25 MG 24 hr tablet TAKE 1/2 TABLET BY MOUTH EVERY DAY Patient taking differently: Take by mouth daily. 12/18/20  Yes Vevelyn Francois, NP  morphine (MS CONTIN) 60 MG 12 hr tablet Take 1 tablet (60 mg total) by mouth every 12 (twelve) hours. 12/19/20 01/18/21 Yes Vevelyn Francois, NP  Oxycodone HCl 10 MG TABS Take 1 tablet (10 mg total) by mouth every 6 (six) hours as needed for up to 15 days. 01/07/21 01/22/21 Yes Vevelyn Francois, NP  Prenatal Vit-Fe Fumarate-FA (PRENATAL VITAMIN PLUS LOW IRON) 27-1 MG TABS TAKE 1 TABLET BY MOUTH EVERY DAY Patient taking differently: Take 1 tablet by mouth daily. 03/03/20  Yes Shelly Bombard, MD  Vitamin D, Ergocalciferol, (DRISDOL) 1.25 MG (50000 UNIT) CAPS capsule TAKE ONE CAPSULE BY MOUTH ONE TIME PER WEEK Patient taking differently: Take 50,000 Units by mouth every 7 (seven) days. 09/03/20  Yes Vevelyn Francois, NP  citalopram (CELEXA) 10 MG tablet Take 1 tablet (10 mg total) by mouth daily. Patient not taking: Reported on 01/18/2021 11/11/20 02/09/21  Azzie Glatter, FNP    Allergies    Patient has no known allergies.  Review of Systems   Review of Systems  Constitutional: Negative.   HENT: Negative.   Respiratory: Positive for cough. Negative for choking, chest tightness, shortness of breath, wheezing and stridor.   Cardiovascular: Negative.   Gastrointestinal: Negative.    Genitourinary: Negative.   Musculoskeletal: Positive for back pain.       Bl leg pain  Skin: Negative.   Neurological: Negative.   All other systems reviewed and are negative.  Physical Exam Updated Vital Signs BP 97/60   Pulse 71   Temp 98.4 F (36.9 C) (Oral)   Resp (!) 21   Ht 5\' 3"  (1.6 m)   Wt 78.5 kg   LMP 12/22/2020 (Exact Date)   SpO2 95%   BMI 30.65 kg/m   Physical Exam Vitals and nursing note reviewed.  Constitutional:  General: She is not in acute distress.    Appearance: She is well-developed. She is not ill-appearing, toxic-appearing or diaphoretic.  HENT:     Head: Normocephalic and atraumatic.     Nose: Nose normal.     Mouth/Throat:     Mouth: Mucous membranes are moist.  Eyes:     Pupils: Pupils are equal, round, and reactive to light.  Cardiovascular:     Rate and Rhythm: Normal rate.     Pulses: Normal pulses.     Heart sounds: Normal heart sounds.  Pulmonary:     Effort: Pulmonary effort is normal. No respiratory distress.     Breath sounds: Normal breath sounds.  Abdominal:     General: Bowel sounds are normal. There is no distension.     Tenderness: There is no abdominal tenderness. There is no guarding or rebound.  Musculoskeletal:        General: Normal range of motion.     Cervical back: Normal range of motion.     Comments: Compartments soft.  No bony tenderness.  Bevelyn Buckles' sign negative.  Moves all 4 extremities without difficulty.  No obvious effusions, erythema or warmth over joints.  No midline spinal tenderness, crepitus or step-off.  No palpable spasms to back.  Negative straight leg raise bilaterally.    Skin:    General: Skin is warm and dry.     Capillary Refill: Capillary refill takes less than 2 seconds.     Comments: No edema, erythema or warmth.  No fluctuance induration.  Neurological:     General: No focal deficit present.     Mental Status: She is alert and oriented to person, place, and time.     Cranial Nerves:  Cranial nerves are intact.     Sensory: Sensation is intact.     Motor: Motor function is intact.     Gait: Gait is intact.     Comments: Intact sensation Ambulatory with out difficulty Equal strength bilaterally    ED Results / Procedures / Treatments   Labs (all labs ordered are listed, but only abnormal results are displayed) Labs Reviewed  COMPREHENSIVE METABOLIC PANEL - Abnormal; Notable for the following components:      Result Value   Glucose, Bld 102 (*)    Total Protein 8.3 (*)    All other components within normal limits  CBC WITH DIFFERENTIAL/PLATELET - Abnormal; Notable for the following components:   RBC 3.21 (*)    Hemoglobin 9.6 (*)    HCT 26.9 (*)    Platelets 650 (*)    nRBC 0.3 (*)    All other components within normal limits  RETICULOCYTES - Abnormal; Notable for the following components:   Retic Ct Pct 5.2 (*)    RBC. 3.21 (*)    Immature Retic Fract 35.9 (*)    All other components within normal limits  I-STAT BETA HCG BLOOD, ED ([facility], [facility], [facility] ONLY)    EKG EKG Interpretation  Date/Time:  Sunday January 18 2021 07:27:03 EDT Ventricular Rate:  77 PR Interval:    QRS Duration: 95 QT Interval:  361 QTC Calculation: 409 R Axis:   59 Text Interpretation: Sinus rhythm RSR' in V1 or V2, right VCD or RVH since previous tracing, tachycardia has resolved Confirmed by Theotis Burrow [phone]) on 01/18/2021 10:25:29 AM   Radiology DG Chest Port 1 View  Result Date: 01/18/2021 CLINICAL DATA:  30 year old female sickle cell disease. Chest pain. Lower extremity swelling for 4 days. EXAM: PORTABLE  CHEST 1 VIEW COMPARISON:  Chest radiographs 10/14/2020 and earlier. FINDINGS: Portable AP semi upright view at 0731 hours. Mildly lower lung volumes. Mediastinal contours remain normal. Visualized tracheal air column is within normal limits. No pneumothorax, pulmonary edema or pleural effusion. Streaky peribronchial opacity at the right lung base tenting the inferior pulmonary  ligament or diaphragm there is new since December. Paucity of bowel gas in the upper abdomen. Visible osseous structures appear within normal limits. IMPRESSION: Abnormal right lung base opacity new since December may represent bronchopneumonia or acute chest syndrome in the setting of sickle cell disease. No pleural effusion. Electronically Signed   By: Genevie Ann M.D.   On: 01/18/2021 07:44    Procedures .Critical Care Performed by: Nettie Elm, PA-C Authorized by: Nettie Elm, PA-C   Critical care provider statement:    Critical care time (minutes):  45   Critical care was necessary to treat or prevent imminent or life-threatening deterioration of the following conditions:  Circulatory failure (Acute chest syndrome, sickle cell crisis)   Critical care was time spent personally by me on the following activities:  Discussions with consultants, evaluation of patient's response to treatment, examination of patient, ordering and performing treatments and interventions, ordering and review of laboratory studies, ordering and review of radiographic studies, pulse oximetry, re-evaluation of patient's condition, obtaining history from patient or surrogate and review of old charts     Medications Ordered in ED Medications  ondansetron (ZOFRAN) injection 4 mg (4 mg Intravenous Given 01/18/21 0842)  azithromycin (ZITHROMAX) 500 mg in sodium chloride 0.9 % 250 mL IVPB (500 mg Intravenous New Bag/Given 01/18/21 1058)  HYDROmorphone (DILAUDID) injection 2 mg (2 mg Subcutaneous Given 01/18/21 0841)  HYDROmorphone (DILAUDID) injection 2 mg (2 mg Subcutaneous Given 01/18/21 0920)  diphenhydrAMINE (BENADRYL) injection 25 mg (25 mg Intravenous Given 01/18/21 0841)  sodium chloride 0.9 % bolus 1,000 mL (1,000 mLs Intravenous Bolus 01/18/21 1005)  cefTRIAXone (ROCEPHIN) 1 g in sodium chloride 0.9 % 100 mL IVPB (0 g Intravenous Stopped 01/18/21 1056)   ED Course  I have reviewed the triage vital signs and  the nursing notes.  Pertinent labs & imaging results that were available during my care of the patient were reviewed by me and considered in my medical decision making (see chart for details).  30 year old presents for evaluation of pain consistent with sickle cell crises.  Pain located to lower back, bilateral lower extremities.  No unilateral leg swelling, redness or warmth.  No clinical evidence of DVT on exam.  Triage note notes chest pain however patient denies this.  States occasionally when she moves she gets pain to her lower back that shoots to her upper thorax.  Denies any exertional pleuritic chest pain.  No shortness of breath, fever.  She does not appear septic or ill.  Plan on labs, imaging, pain medication and reassess.  Labs and imaging personally reviewed and interpreted:  CBC without leukocytosis, hemoglobin 9.6 at baseline CMP glucose 102, no additional electrolyte, renal or liver abnormality Retics increase Preg negative DG chest possible right lower lung infiltrate, could be due to bronchopneumonia versus acute chest syndrome EKG no ischemic changes  Patient reassessed. Pain improved to lower legs however still rates a 8/10 to lower back. Will plan admission for continued managment. Intermittent soft BP, Will give IVF bolus.  Plan on Rocephin and azithromycin for right lower lobe infiltrate known sickle cell disease. Low suspicion for sepsis, PE, ACS, dissection.  CONSULT with Dr.  [name] with [facility] who agrees to evaluate patient for admission.  The patient appears reasonably stabilized for admission considering the current resources, flow, and capabilities available in the ED at this time, and I doubt any other [facility] requiring further screening and/or treatment in the ED prior to admission.   Discussed with attending Dr. Rex Kras who is agreeable for treatment, plan and disposition.     MDM Rules/Calculators/A&P                           Final Clinical Impression(s) / ED  Diagnoses Final diagnoses:  Sickle cell pain crisis ([place])  Community acquired pneumonia of right lower lobe of lung    Rx / DC Orders ED Discharge Orders    None       Henderly, Britni A, PA-C 01/18/21 1120    Little, Wenda Overland, MD 01/21/21 1436

## 2021-01-19 DIAGNOSIS — D5701 Hb-SS disease with acute chest syndrome: Secondary | ICD-10-CM | POA: Diagnosis not present

## 2021-01-19 LAB — URINALYSIS, ROUTINE W REFLEX MICROSCOPIC
Bilirubin Urine: NEGATIVE
Glucose, UA: NEGATIVE mg/dL
Hgb urine dipstick: NEGATIVE
Ketones, ur: NEGATIVE mg/dL
Leukocytes,Ua: NEGATIVE
Nitrite: NEGATIVE
Protein, ur: NEGATIVE mg/dL
Specific Gravity, Urine: 1.013 (ref 1.005–1.030)
pH: 5 (ref 5.0–8.0)

## 2021-01-19 LAB — CBC WITH DIFFERENTIAL/PLATELET
Abs Immature Granulocytes: 0.08 10*3/uL — ABNORMAL HIGH (ref 0.00–0.07)
Basophils Absolute: 0 10*3/uL (ref 0.0–0.1)
Basophils Relative: 1 %
Eosinophils Absolute: 0.3 10*3/uL (ref 0.0–0.5)
Eosinophils Relative: 3 %
HCT: 23.1 % — ABNORMAL LOW (ref 36.0–46.0)
Hemoglobin: 8.4 g/dL — ABNORMAL LOW (ref 12.0–15.0)
Immature Granulocytes: 1 %
Lymphocytes Relative: 39 %
Lymphs Abs: 3.5 10*3/uL (ref 0.7–4.0)
MCH: 29.7 pg (ref 26.0–34.0)
MCHC: 36.4 g/dL — ABNORMAL HIGH (ref 30.0–36.0)
MCV: 81.6 fL (ref 80.0–100.0)
Monocytes Absolute: 1.2 10*3/uL — ABNORMAL HIGH (ref 0.1–1.0)
Monocytes Relative: 13 %
Neutro Abs: 3.8 10*3/uL (ref 1.7–7.7)
Neutrophils Relative %: 43 %
Platelets: 526 10*3/uL — ABNORMAL HIGH (ref 150–400)
RBC: 2.83 MIL/uL — ABNORMAL LOW (ref 3.87–5.11)
RDW: 14.2 % (ref 11.5–15.5)
WBC: 8.9 10*3/uL (ref 4.0–10.5)
nRBC: 0.2 % (ref 0.0–0.2)

## 2021-01-19 LAB — BASIC METABOLIC PANEL
Anion gap: 8 (ref 5–15)
BUN: 7 mg/dL (ref 6–20)
CO2: 23 mmol/L (ref 22–32)
Calcium: 9.1 mg/dL (ref 8.9–10.3)
Chloride: 106 mmol/L (ref 98–111)
Creatinine, Ser: 0.51 mg/dL (ref 0.44–1.00)
GFR, Estimated: >60 mL/min (ref >60)
Glucose, Bld: 116 mg/dL — ABNORMAL HIGH (ref 70–99)
Potassium: 3.1 mmol/L — ABNORMAL LOW (ref 3.5–5.1)
Sodium: 137 mmol/L (ref 135–145)

## 2021-01-19 MED ORDER — SODIUM CHLORIDE 0.9 % IV SOLN
6.2500 mg | Freq: Once | INTRAVENOUS | Status: AC
  Administered 2021-01-19: 6.25 mg via INTRAVENOUS
  Filled 2021-01-19: qty 0.25

## 2021-01-19 MED ORDER — MORPHINE SULFATE ER 60 MG PO TBCR
60.0000 mg | EXTENDED_RELEASE_TABLET | Freq: Two times a day (BID) | ORAL | 0 refills | Status: DC
Start: 2021-01-19 — End: 2021-02-19

## 2021-01-19 MED ORDER — PROMETHAZINE HCL 25 MG PO TABS
25.0000 mg | ORAL_TABLET | Freq: Once | ORAL | Status: AC
  Administered 2021-01-19: 25 mg via ORAL
  Filled 2021-01-19: qty 1

## 2021-01-19 NOTE — BH Specialist Note (Signed)
[facility] General Follow Up Note  01/19/2021 Name: Carmen [name] MRN: [id number] DOB: April 27, 1991 Carmen [name] is a 30 y.o. year old female who sees Vevelyn Francois, NP for primary care. LCSW was initially consulted to assist the patient with Mental Health Counseling and Resources.  Interpreter: No.   Interpreter Name & Language: none  Assessment: Patient experiencing stress related to living with chronic illness. Patient would also like to find work.   Ongoing Intervention: Today CSW visited patient briefly at bedside in [place]. Advised that Patient [facility]) can assist with transportation to [facility] where CSW has previously referred patient. Patient to follow up with CSW when discharged and once she has made a new appointment with [facility]. Patient will also need assistance with transportation for outpatient follow up appointments with PCP and CSW at [facility]. CSW to assist with this transportation. CSW to follow.   Review of patient status, including review of consultants reports, relevant laboratory and other test results, and collaboration with appropriate care team members and the patient's provider was performed as part of comprehensive patient evaluation and provision of services.     Follow up Plan: 1.  CSW to follow.  Estanislado Emms, [facility] [phone]

## 2021-01-19 NOTE — Progress Notes (Signed)
Subjective: Carmen [name] is a 30 year old female with a medical history significant for sickle cell disease, chronic pain syndrome, opiate dependence and tolerance, and history of anemia of chronic disease was admitted for probable acute chest syndrome in the setting of sickle cell pain crisis.  Patient says that pain has improved some overnight.  Pain continues to be mostly low back and lower extremities.  She rates pain as 7/10 characterized as constant and throbbing.  Has been afebrile overnight and does not have an oxygen requirement at this time.  Objective:  Vital signs in last 24 hours:  Vitals:   01/20/21 0724 01/20/21 0937 01/20/21 1157 01/20/21 1300  BP:  107/60  117/66  Pulse:  80  87  Resp: 16  14 18   Temp:    98.7 F (37.1 C)  TempSrc:    Oral  SpO2: 94% 93% 100% 93%  Weight:      Height:        Intake/Output from previous day:   Intake/Output Summary (Last 24 hours) at 01/20/2021 1554 Last data filed at 01/20/2021 1100 Gross per 24 hour  Intake 2776.76 ml  Output 1100 ml  Net 1676.76 ml    Physical Exam: General: Alert, awake, oriented x3, in no acute distress.  HEENT: [place]/AT PEERL, EOMI Neck: Trachea midline,  no masses, no thyromegal,y no JVD, no carotid bruit OROPHARYNX:  Moist, No exudate/ erythema/lesions.  Heart: Regular rate and rhythm, without murmurs, rubs, gallops, PMI non-displaced, no heaves or thrills on palpation.  Lungs: Clear to auscultation, no wheezing or rhonchi noted. No increased vocal fremitus resonant to percussion  Abdomen: Soft, nontender, nondistended, positive bowel sounds, no masses no hepatosplenomegaly noted..  Neuro: No focal neurological deficits noted cranial nerves II through XII grossly intact. DTRs 2+ bilaterally upper and lower extremities. Strength 5 out of 5 in bilateral upper and lower extremities. Musculoskeletal: No warm swelling or erythema around joints, no spinal tenderness noted. Psychiatric: Patient alert and  oriented x3, good insight and cognition, good recent to remote recall. Lymph node survey: No cervical axillary or inguinal lymphadenopathy noted.  Lab Results:  Basic Metabolic Panel:    Component Value Date/Time   NA 137 01/19/2021 0532   NA 142 07/02/2020 1040   K 3.1 (L) 01/19/2021 0532   CL 106 01/19/2021 0532   CO2 23 01/19/2021 0532   BUN 7 01/19/2021 0532   BUN 8 07/02/2020 1040   CREATININE 0.51 01/19/2021 0532   GLUCOSE 116 (H) 01/19/2021 0532   CALCIUM 9.1 01/19/2021 0532   CBC:    Component Value Date/Time   WBC 8.9 01/19/2021 0507   HGB 8.4 (L) 01/19/2021 0507   HGB 9.8 (L) 07/02/2020 1040   HCT 23.1 (L) 01/19/2021 0507   HCT 28.9 (L) 07/02/2020 1040   PLT 526 (H) 01/19/2021 0507   PLT 628 (H) 07/02/2020 1040   MCV 81.6 01/19/2021 0507   MCV 88 07/02/2020 1040   NEUTROABS 3.8 01/19/2021 0507   NEUTROABS 3.0 07/02/2020 1040   LYMPHSABS 3.5 01/19/2021 0507   LYMPHSABS 3.5 (H) 07/02/2020 1040   MONOABS 1.2 (H) 01/19/2021 0507   EOSABS 0.3 01/19/2021 0507   EOSABS 0.2 07/02/2020 1040   BASOSABS 0.0 01/19/2021 0507   BASOSABS 0.1 07/02/2020 1040    Recent Results (from the past 240 hour(s))  Resp Panel by RT-PCR (Flu A&B, Covid)     Status: None   Collection Time: 01/18/21  3:17 PM  Result Value Ref Range Status  SARS Coronavirus 2 by RT PCR NEGATIVE NEGATIVE Final    Comment: (NOTE) SARS-CoV-2 target nucleic acids are NOT DETECTED.  The SARS-CoV-2 RNA is generally detectable in upper respiratory specimens during the acute phase of infection. The lowest concentration of SARS-CoV-2 viral copies this assay can detect is 138 copies/mL. A negative result does not preclude SARS-Cov-2 infection and should not be used as the sole basis for treatment or other patient management decisions. A negative result may occur with  improper specimen collection/handling, submission of specimen other than nasopharyngeal swab, presence of viral mutation(s) within  the areas targeted by this assay, and inadequate number of viral copies(<138 copies/mL). A negative result must be combined with clinical observations, patient history, and epidemiological information. The expected result is Negative.  Fact Sheet for Patients:  EntrepreneurPulse.com.au  Fact Sheet for Healthcare Providers:  IncredibleEmployment.be  This test is no t yet approved or cleared by the [place] FDA and  has been authorized for detection and/or diagnosis of SARS-CoV-2 by FDA under an Emergency Use Authorization (EUA). This EUA will remain  in effect (meaning this test can be used) for the duration of the COVID-19 declaration under Section 564(b)(1) of the Act, 21 U.S.C.section 360bbb-3(b)(1), unless the authorization is terminated  or revoked sooner.       Influenza A by PCR NEGATIVE NEGATIVE Final   Influenza B by PCR NEGATIVE NEGATIVE Final    Comment: (NOTE) The Xpert Xpress SARS-CoV-2/FLU/RSV plus assay is intended as an aid in the diagnosis of influenza from Nasopharyngeal swab specimens and should not be used as a sole basis for treatment. Nasal washings and aspirates are unacceptable for Xpert Xpress SARS-CoV-2/FLU/RSV testing.  Fact Sheet for Patients: EntrepreneurPulse.com.au  Fact Sheet for Healthcare Providers: IncredibleEmployment.be  This test is not yet approved or cleared by the [place] FDA and has been authorized for detection and/or diagnosis of SARS-CoV-2 by FDA under an Emergency Use Authorization (EUA). This EUA will remain in effect (meaning this test can be used) for the duration of the COVID-19 declaration under Section 564(b)(1) of the Act, 21 U.S.C. section 360bbb-3(b)(1), unless the authorization is terminated or revoked.  Performed at [facility], [place] [address] [place], [place] [address]     Studies/Results: No results  found.  Medications: Scheduled Meds:  azithromycin  500 mg Oral Daily   enoxaparin (LOVENOX) injection  40 mg Subcutaneous I77O   folic acid  1 mg Oral Daily   HYDROmorphone   Intravenous Q4H   ketorolac  15 mg Intravenous Q6H   metoprolol succinate  12.5 mg Oral Daily   morphine  60 mg Oral Q12H   senna-docusate  1 tablet Oral BID   Continuous Infusions:  cefTRIAXone (ROCEPHIN)  IV 1 g (01/20/21 1023)   dextrose 5 % and 0.45% NaCl Stopped (01/20/21 1515)   diphenhydrAMINE     PRN Meds:.diphenhydrAMINE **OR** diphenhydrAMINE, naloxone **AND** sodium chloride flush, ondansetron (ZOFRAN) IV, polyethylene glycol  Consultants:  None  Procedures:  None  Antibiotics:  Oral azithromycin  IV ceftriaxone  Assessment/Plan: Principal Problem:   Acute chest syndrome (HCC) Active Problems:   Anemia of chronic disease  Acute chest syndrome: Oxygen saturation is 95% on RA, patient does not have an oxygen requirement at this time.  Patient is afebrile without any dyspnea.  We will continue IV ceftriaxone and azithromycin overnight.  If patient remains afebrile, will discontinue IV antibiotics at that time.  We will continue to monitor closely without exchange transfusion.  Sickle  cell disease with pain crisis: Continue IV fluids, 0.45% saline at 125 mL/h Toradol 15 mg IV every 6 hours Monitor vital signs closely, reevaluate pain scale regularly, and supplemental oxygen as needed.  Sickle cell anemia: Hemoglobin is stable and consistent with patient's baseline.  There is no clinical indication for blood transfusion at this time.  Continue to follow closely.  Chronic pain syndrome: Continue home medications  Major depressive disorder: Patient is clinically stable.  She denies any suicidal or homicidal thoughts.  Continue to monitor closely.  Code Status: Full Code Family Communication: N/A Disposition Plan: Not yet ready for discharge  [place],  MSN, FNP-C Patient [place] [address] [place], [place] [address] [phone]  If 5PM-8AM, please contact night-coverage.  01/20/2021, 3:54 PM  LOS: 2 days

## 2021-01-19 NOTE — Plan of Care (Signed)
  Problem: Education: Goal: Knowledge of General Education information will improve Description Including pain rating scale, medication(s)/side effects and non-pharmacologic comfort measures Outcome: Progressing   

## 2021-01-19 NOTE — Telephone Encounter (Signed)
Refill sent due on 01/19/21

## 2021-01-20 DIAGNOSIS — D5701 Hb-SS disease with acute chest syndrome: Secondary | ICD-10-CM | POA: Diagnosis not present

## 2021-01-20 NOTE — Progress Notes (Signed)
Pt given and explained discharge instructions. PCA stopped and IV removed. Pt dressed in personal clothing and is waiting for her sister to arrive.

## 2021-01-20 NOTE — Discharge Summary (Signed)
Physician Discharge Summary  Carmen [name] AST:[id number] DOB: 07/30/1991 DOA: 01/18/2021  PCP: Vevelyn Francois, NP  Admit date: 01/18/2021  Discharge date: 01/20/2021  Discharge Diagnoses:  Principal Problem:   Acute chest syndrome [facility]) Active Problems:   Anemia of chronic disease   Discharge Condition: Stable  Disposition:   Follow-up Information    Vevelyn Francois, NP Follow up in 2 week(s).   Specialty: Adult Health Nurse Practitioner Contact information: [address] [name] [address] [phone]              Pt is discharged home in good condition and is to follow up with Vevelyn Francois, NP this week to have labs evaluated. Carmen [name] is instructed to increase activity slowly and balance with rest for the next few days, and use prescribed medication to complete treatment of pain  Diet: Regular Wt Readings from Last 3 Encounters:  01/18/21 78.5 kg  12/31/20 79.4 kg  10/13/20 79.4 kg    History of present illness:  Carmen [name] is a 30 year old female with medical history disease, chronic pain syndrome, opiate dependence and tolerance, and history of anemia of chronic disease who presents to the ED today with major complaints of generalized body pain that is consistent of her sickle cell pain crisis.  She also complained of some right-sided chest pain with occasional cough.  Patient rates her pain as 10/10.  She ran out of her home pain medication since last Friday and was unable to get relief from home regimen.  She has no fever.  She has no chills.  She denies any shortness of breath or headache.  She denies any urinary symptoms.  No nausea vomiting or diarrhea.  She denies any sick contacts, recent travel, or exposure to COVID-19.  ED course: Vital signs showed BP of 97/60, pulse 71, temperature 98.4 F, respirations 21, and oxygen saturation 95% on RA.  Hemoglobin 9.6, WBC 7.8 with a platelet count of 650.  Of note, patient has chronic  thrombocytosis.  Comprehensive metabolic panel is unremarkable.  Total bilirubin is normal.  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 The pain persists despite IV Dilaudid and IV fluids, patient admitted to [place] for further management of sickle cell pain crisis.   Hospital Course:  Patient was admitted for sickle cell pain crisis and managed appropriately with IVF, IV Dilaudid via PCA and IV Toradol, as well as other adjunct therapies per sickle cell pain management protocols.  Patient was continued on home pain medications throughout admission.  Patient will follow-up with PCP for medication management as scheduled.  MS Contin 30 mg every 12 hours #60 was sent to patient's pharmacy by PCP. Patient's pain intensity is 3/10.  She is requesting discharge home and states that she can manage on home pain medication.  Patient was therefore discharged home today in a hemodynamically stable condition.   Carmen [name] will follow-up with PCP within 1 week of this discharge. Carmen [name] was counseled extensively about nonpharmacologic means of pain management, patient verbalized understanding and was appreciative of  the care received during this admission.   We discussed the need for good hydration, monitoring of hydration status, avoidance of heat, cold, stress, and infection triggers. We discussed the need to be adherent with taking home medications. Patient was reminded of the need to seek medical attention  immediately if any symptom of bleeding, anemia, or infection occurs.  Discharge Exam: Vitals:   01/20/21 1157 01/20/21 1300  BP:  117/66  Pulse:  87  Resp: 14 18  Temp:  98.7 F (37.1 C)  SpO2: 100% 93%   Vitals:   01/20/21 0724 01/20/21 0937 01/20/21 1157 01/20/21 1300  BP:  107/60  117/66  Pulse:  80  87  Resp: 16  14 18   Temp:     98.7 F (37.1 C)  TempSrc:    Oral  SpO2: 94% 93% 100% 93%  Weight:      Height:        General appearance : Awake, alert, not in any distress. Speech Clear. Not toxic looking HEENT: Atraumatic and Normocephalic, pupils equally reactive to light and accomodation Neck: Supple, no JVD. No cervical lymphadenopathy.  Chest: Good air entry bilaterally, no added sounds  CVS: S1 S2 regular, no murmurs.  Abdomen: Bowel sounds present, Non tender and not distended with no gaurding, rigidity or rebound. Extremities: B/L Lower Ext shows no edema, both legs are warm to touch Neurology: Awake alert, and oriented X 3, CN II-XII intact, Non focal Skin: No Rash  Discharge Instructions  Discharge Instructions    Discharge patient   Complete by: As directed    Discharge disposition: 01-Home or Self Care   Discharge patient date: 01/20/2021     Allergies as of 01/20/2021   No Known Allergies     Medication List    TAKE these medications   Aspirin Low Dose 81 MG EC tablet Generic drug: aspirin Take 81 mg by mouth daily.   citalopram 10 MG tablet Commonly known as: CeleXA Take 1 tablet (10 mg total) by mouth daily.   cyclobenzaprine 10 MG tablet Commonly known as: FLEXERIL TAKE 1 TABLET BY MOUTH TWICE A DAY What changed:   when to take this  reasons to take this   folic acid 1 MG tablet Commonly known as: FOLVITE Take 1 tablet (1 mg total) by mouth daily.   metoprolol succinate 25 MG 24 hr tablet Commonly known as: TOPROL-XL TAKE 1/2 TABLET BY MOUTH EVERY DAY What changed: how much to take   morphine 60 MG 12 hr tablet Commonly known as: MS Contin Take 1 tablet (60 mg total) by mouth every 12 (twelve) hours. What changed: Another medication with the same name was added. Make sure you understand how and when to take each.   MS Contin 60 MG 12 hr tablet Generic drug: morphine Take 1 tablet (60 mg total) by mouth every 12 (twelve) hours. What changed: You were already taking  a medication with the same name, and this prescription was added. Make sure you understand how and when to take each.   Oxycodone HCl 10 MG Tabs Take 1 tablet (10 mg total) by mouth every 6 (six) hours as needed for up to 15 days.   Prenatal Vitamin Plus Low Iron 27-1 MG Tabs TAKE 1 TABLET BY MOUTH EVERY DAY   Vitamin D (Ergocalciferol) 1.25 MG (50000 UNIT) Caps capsule Commonly known as: DRISDOL TAKE ONE CAPSULE BY MOUTH ONE TIME PER WEEK What changed:   how much to take  how to take this  when to take this  additional instructions       The results of significant diagnostics from this hospitalization (including imaging, microbiology, ancillary and laboratory) are listed below for reference.    Significant Diagnostic Studies: DG Chest Port 1 View  Result Date: 01/18/2021 CLINICAL  DATA:  30 year old female sickle cell disease. Chest pain. Lower extremity swelling for 4 days. EXAM: PORTABLE CHEST 1 VIEW COMPARISON:  Chest radiographs 10/14/2020 and earlier. FINDINGS: Portable AP semi upright view at 0731 hours. Mildly lower lung volumes. Mediastinal contours remain normal. Visualized tracheal air column is within normal limits. No pneumothorax, pulmonary edema or pleural effusion. Streaky peribronchial opacity at the right lung base tenting the inferior pulmonary ligament or diaphragm there is new since December. Paucity of bowel gas in the upper abdomen. Visible osseous structures appear within normal limits. IMPRESSION: Abnormal right lung base opacity new since December may represent bronchopneumonia or acute chest syndrome in the setting of sickle cell disease. No pleural effusion. Electronically Signed   By: Genevie Ann M.D.   On: 01/18/2021 07:44    Microbiology: Recent Results (from the past 240 hour(s))  Resp Panel by RT-PCR (Flu A&B, Covid)     Status: None   Collection Time: 01/18/21  3:17 PM  Result Value Ref Range Status   SARS Coronavirus 2 by RT PCR NEGATIVE NEGATIVE  Final    Comment: (NOTE) SARS-CoV-2 target nucleic acids are NOT DETECTED.  The SARS-CoV-2 RNA is generally detectable in upper respiratory specimens during the acute phase of infection. The lowest concentration of SARS-CoV-2 viral copies this assay can detect is 138 copies/mL. A negative result does not preclude SARS-Cov-2 infection and should not be used as the sole basis for treatment or other patient management decisions. A negative result may occur with  improper specimen collection/handling, submission of specimen other than nasopharyngeal swab, presence of viral mutation(s) within the areas targeted by this assay, and inadequate number of viral copies(<138 copies/mL). A negative result must be combined with clinical observations, patient history, and epidemiological information. The expected result is Negative.  Fact Sheet for Patients:  EntrepreneurPulse.com.au  Fact Sheet for Healthcare Providers:  IncredibleEmployment.be  This test is no t yet approved or cleared by the [place] FDA and  has been authorized for detection and/or diagnosis of SARS-CoV-2 by FDA under an Emergency Use Authorization (EUA). This EUA will remain  in effect (meaning this test can be used) for the duration of the COVID-19 declaration under Section 564(b)(1) of the Act, 21 U.S.C.section 360bbb-3(b)(1), unless the authorization is terminated  or revoked sooner.       Influenza A by PCR NEGATIVE NEGATIVE Final   Influenza B by PCR NEGATIVE NEGATIVE Final    Comment: (NOTE) The Xpert Xpress SARS-CoV-2/FLU/RSV plus assay is intended as an aid in the diagnosis of influenza from Nasopharyngeal swab specimens and should not be used as a sole basis for treatment. Nasal washings and aspirates are unacceptable for Xpert Xpress SARS-CoV-2/FLU/RSV testing.  Fact Sheet for Patients: EntrepreneurPulse.com.au  Fact Sheet for Healthcare  Providers: IncredibleEmployment.be  This test is not yet approved or cleared by the [place] FDA and has been authorized for detection and/or diagnosis of SARS-CoV-2 by FDA under an Emergency Use Authorization (EUA). This EUA will remain in effect (meaning this test can be used) for the duration of the COVID-19 declaration under Section 564(b)(1) of the Act, 21 U.S.C. section 360bbb-3(b)(1), unless the authorization is terminated or revoked.  Performed at [facility], [place] [address], [place], [place] [address]      Labs: Basic Metabolic Panel: Recent Labs  Lab 01/18/21 0702 01/19/21 0532  NA 141 137  K 3.5 3.1*  CL 110 106  CO2 23 23  GLUCOSE 102* 116*  BUN 10 7  CREATININE 0.46 0.51  CALCIUM 9.9 9.1   Liver Function Tests: Recent Labs  Lab 01/18/21 0702  AST 17  ALT 14  ALKPHOS 48  BILITOT 1.2  PROT 8.3*  ALBUMIN 4.5   No results for input(s): LIPASE, AMYLASE in the last 168 hours. No results for input(s): AMMONIA in the last 168 hours. CBC: Recent Labs  Lab 01/18/21 0702 01/19/21 0507  WBC 7.8 8.9  NEUTROABS 4.6 3.8  HGB 9.6* 8.4*  HCT 26.9* 23.1*  MCV 83.8 81.6  PLT 650* 526*   Cardiac Enzymes: No results for input(s): CKTOTAL, CKMB, CKMBINDEX, TROPONINI in the last 168 hours. BNP: Invalid input(s): POCBNP CBG: No results for input(s): GLUCAP in the last 168 hours.  Time coordinating discharge: 35 minutes  Signed:  [name]  APRN, MSN, FNP-C [facility] [address] [place], [place] [address] [phone]  [facility] 01/20/2021, 4:00 PM

## 2021-01-21 ENCOUNTER — Telehealth: Payer: Self-pay | Admitting: *Deleted

## 2021-01-21 NOTE — Telephone Encounter (Signed)
Transition Care Management Unsuccessful Follow-up Telephone Call  Date of discharge and from where:  01/20/2021 - [facility]  Attempts:  1st Attempt  Reason for unsuccessful TCM follow-up call:  Left voice message

## 2021-01-22 NOTE — Telephone Encounter (Signed)
Transition Care Management Follow-up Telephone Call  Date of discharge and from where: 01/20/2021 - [facility]  How have you been since you were released from the hospital? "The same"  Any questions or concerns? No  Items Reviewed:  Did the pt receive and understand the discharge instructions provided? Yes   Medications obtained and verified? Yes   Other? No   Any new allergies since your discharge? No   Dietary orders reviewed? No  Do you have support at home? Yes     Functional Questionnaire: (I = Independent and D = Dependent) ADLs: I  Bathing/Dressing- I  Meal Prep- I  Eating- I  Maintaining continence- I  Transferring/Ambulation- I  Managing Meds- I  Follow up appointments reviewed:   PCP Hospital f/u appt confirmed? Yes  Scheduled to see Dionisio David, NP on 01/28/2021 @ 1100.  [facility] f/u appt confirmed? No    Are transportation arrangements needed? No   If their condition worsens, is the pt aware to call PCP or go to the Emergency Dept.? Yes  Was the patient provided with contact information for the PCP's office or ED? Yes  Was to pt encouraged to call back with questions or concerns? Yes

## 2021-01-27 ENCOUNTER — Other Ambulatory Visit: Payer: Self-pay

## 2021-01-27 DIAGNOSIS — D571 Sickle-cell disease without crisis: Secondary | ICD-10-CM

## 2021-01-27 DIAGNOSIS — Z79891 Long term (current) use of opiate analgesic: Secondary | ICD-10-CM

## 2021-01-28 ENCOUNTER — Ambulatory Visit: Payer: Self-pay | Admitting: Clinical

## 2021-01-28 ENCOUNTER — Other Ambulatory Visit: Payer: Self-pay | Admitting: Family Medicine

## 2021-01-28 ENCOUNTER — Telehealth (INDEPENDENT_AMBULATORY_CARE_PROVIDER_SITE_OTHER): Payer: Medicaid Other | Admitting: Nurse Practitioner

## 2021-01-28 DIAGNOSIS — D571 Sickle-cell disease without crisis: Secondary | ICD-10-CM

## 2021-01-28 DIAGNOSIS — E876 Hypokalemia: Secondary | ICD-10-CM

## 2021-01-28 DIAGNOSIS — Z79891 Long term (current) use of opiate analgesic: Secondary | ICD-10-CM

## 2021-01-28 DIAGNOSIS — F3341 Major depressive disorder, recurrent, in partial remission: Secondary | ICD-10-CM | POA: Diagnosis not present

## 2021-01-28 DIAGNOSIS — F32A Depression, unspecified: Secondary | ICD-10-CM

## 2021-01-28 DIAGNOSIS — D5700 Hb-SS disease with crisis, unspecified: Secondary | ICD-10-CM

## 2021-01-28 MED ORDER — OXYCODONE HCL 10 MG PO TABS
10.0000 mg | ORAL_TABLET | Freq: Four times a day (QID) | ORAL | 0 refills | Status: DC | PRN
Start: 2021-01-28 — End: 2021-02-04

## 2021-01-28 MED ORDER — CITALOPRAM HYDROBROMIDE 20 MG PO TABS: 20.0000 mg | ORAL_TABLET | Freq: Every day | ORAL | 11 refills | Status: DC

## 2021-01-28 NOTE — Progress Notes (Signed)
   [name], [name]  [address] Phone:  [phone]   Fax:  [phone] Virtual Visit via Video Note  I connected with Carmen [name] on 01/28/21 at 11:00 AM EDT by video and verified that I am speaking with the correct person using two identifiers.   I discussed the limitations, risks, security and privacy concerns of performing an evaluation and management service by telephone and the availability of in person appointments. I also discussed with the patient that there may be a patient responsible charge related to this service. The patient expressed understanding and agreed to proceed.  Patient home Provider Office  History of Present Illness:  She went out with her friends, and a family member on Monday notified her someone pain Covid 19. She just wanted to be on the safe side.  She is going to get a test on Friday. She is asymptomatic.  She is having 6/10 .She was recently hospitalized because she felt like she was having acute chest symptoms. She was treated with antibiotics.  She states later her chest x-ray was normal.  She denies any chest pain . She is feeling of fatigue. She feels fine otherwise.   She has been under increased stress.  She continues to use the Citalopram. She is concerned that it may not be as effective.  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 Diagnoses of Recurrent major depressive disorder, in partial remission (HCC) and Hypokalemia were also pertinent to this visit.  Visit Diagnosis: 1. Hb-SS disease without crisis ([place])  Ensure adequate hydration. Move frequently to reduce venous thromboembolism risk. Avoid situations that could lead to dehydration or could exacerbate pain Discussed S&S of infection, seizures, stroke acute chest, DVT and how  important it is to seek medical attention Take medication as directed along with pain contract and overall compliance Discussed the risk related to opiate use (addition, tolerance and dependency)    2. Recurrent major depressive disorder, in partial remission (HCC)  Persistent increase citalopram 20 mg daily  3. Hypokalemia  Stable labs pending    Follow Up Instructions: Patient to call to schedule a lab only appointment in 4 weeks follow-up for medication change   I discussed the assessment and treatment plan with the patient. The patient was provided an opportunity to ask questions and all were answered. The patient agreed with the plan and demonstrated an understanding of the instructions.   The patient was advised to call back or seek an in-person evaluation if the symptoms worsen or if the condition fails to improve as anticipated.  I provided 11 minutes of video- face-to-face time during this encounter.   Vevelyn Francois, NP

## 2021-01-28 NOTE — BH Specialist Note (Signed)
Integrated Behavioral Health General Follow Up Note  01/28/2021 Name: RELDA AGOSTO MRN: [id number] DOB: 1991-06-09 NATILEE GAUER is a 30 y.o. year old female who sees Vevelyn Francois, NP for primary care. LCSW was initially consulted to support patient with depression, anxiety, and access to community resources.  Interpreter: No.   Interpreter Name & Language: none  Assessment: Patient experiencing depression and anxiety, exacerbated by challenges with work and income and finding sense of purpose. Patient considering application for social security disability.  Ongoing Intervention: Assessment today of what patient would like help with. Patient experiencing anxiety and depression related to chronic illness and its impacts on her ability to work. She is considering applying for disability. She would also like counseling outpatient for dealing with the anxiety and depression. Brief supportive counseling today regarding some unhelpful thoughts patient has about self in context of illness. Patient also agreeable to referral to [facility] for assistance with disability application. CSW to send referral. Made follow up [place] ([facility]) appointment for 02/03/21.  Review of patient status, including review of consultants reports, relevant laboratory and other test results, and collaboration with appropriate care team members and the patient's provider was performed as part of comprehensive patient evaluation and provision of services.     Follow up Plan: 1. [facility] outpatient visit 02/03/21 2. Referral to [facility] [phone]

## 2021-02-03 ENCOUNTER — Ambulatory Visit: Payer: Self-pay | Admitting: Clinical

## 2021-02-03 ENCOUNTER — Telehealth: Payer: Self-pay

## 2021-02-03 NOTE — Telephone Encounter (Signed)
Med refill Oxycodone   Pt said that [facility] sent her the refill but her insurance will not release it because you are listed as her provider.

## 2021-02-04 ENCOUNTER — Other Ambulatory Visit: Payer: Self-pay | Admitting: Nurse Practitioner

## 2021-02-04 DIAGNOSIS — D571 Sickle-cell disease without crisis: Secondary | ICD-10-CM

## 2021-02-04 DIAGNOSIS — Z79891 Long term (current) use of opiate analgesic: Secondary | ICD-10-CM

## 2021-02-04 MED ORDER — OXYCODONE HCL 10 MG PO TABS
10.0000 mg | ORAL_TABLET | Freq: Four times a day (QID) | ORAL | 0 refills | Status: DC | PRN
Start: 2021-02-04 — End: 2021-02-19

## 2021-02-04 NOTE — Telephone Encounter (Signed)
Refill re-sent  

## 2021-02-04 NOTE — Progress Notes (Signed)
   [place],   [address] Phone:  [phone]   Fax:  [phone]  Oxycodone refills resent due to patient being locked in with one provider.

## 2021-02-09 ENCOUNTER — Telehealth: Payer: Self-pay | Admitting: Clinical

## 2021-02-09 NOTE — Telephone Encounter (Signed)
Integrated Behavioral Health General Follow Up Note  01/28/2021 Name: Carmen [name]         MRN: [id number]       DOB: 05-02-1991 Carmen [name] is a 30 y.o. year old female who sees Vevelyn Francois, NP for primary care. LCSW was initially consulted to support patient with depression, anxiety, and access to community resources.   Interpreter: No. Interpreter Name & Language: none  Assessment: Patient experiencing depression and anxiety. Patient also now experiencing acute grief over the recent loss of her sister.   Ongoing Intervention: Called patient to check in. Patient talked about the loss of her sister. CSW provided supportive counseling. Patient reported she will be spending time with her family later today. Made follow up [place] ([facility]) appointment for 02/16/21.  Review of patient status, including review of consultants reports, relevant laboratory and other test results, and collaboration with appropriate care team members and the patient's provider was performed as part of comprehensive patient evaluation and provision of services.     Follow up Plan: 1. [facility] outpatient visit 02/16/21  Estanislado Emms, [facility] [phone]

## 2021-02-12 ENCOUNTER — Ambulatory Visit: Payer: Self-pay | Admitting: Nurse Practitioner

## 2021-02-16 ENCOUNTER — Encounter: Payer: Self-pay | Admitting: Clinical

## 2021-02-16 ENCOUNTER — Inpatient Hospital Stay (HOSPITAL_COMMUNITY)
Admission: AD | Admit: 2021-02-16 | Discharge: 2021-02-19 | DRG: 812 | Disposition: A | Discharge status: Home / Self Care | Payer: Medicaid Other | Source: Ambulatory Visit | Attending: Internal Medicine | Admitting: Internal Medicine

## 2021-02-16 ENCOUNTER — Telehealth (HOSPITAL_COMMUNITY): Payer: Self-pay | Admitting: *Deleted

## 2021-02-16 ENCOUNTER — Encounter (HOSPITAL_COMMUNITY): Payer: Self-pay | Admitting: Internal Medicine

## 2021-02-16 DIAGNOSIS — F419 Anxiety disorder, unspecified: Secondary | ICD-10-CM | POA: Diagnosis present

## 2021-02-16 DIAGNOSIS — Z9081 Acquired absence of spleen: Secondary | ICD-10-CM | POA: Diagnosis not present

## 2021-02-16 DIAGNOSIS — Z79899 Other long term (current) drug therapy: Secondary | ICD-10-CM | POA: Diagnosis not present

## 2021-02-16 DIAGNOSIS — D75838 Other thrombocytosis: Secondary | ICD-10-CM | POA: Diagnosis present

## 2021-02-16 DIAGNOSIS — F17210 Nicotine dependence, cigarettes, uncomplicated: Secondary | ICD-10-CM | POA: Diagnosis present

## 2021-02-16 DIAGNOSIS — Z7982 Long term (current) use of aspirin: Secondary | ICD-10-CM

## 2021-02-16 DIAGNOSIS — Z832 Family history of diseases of the blood and blood-forming organs and certain disorders involving the immune mechanism: Secondary | ICD-10-CM | POA: Diagnosis not present

## 2021-02-16 DIAGNOSIS — G894 Chronic pain syndrome: Secondary | ICD-10-CM | POA: Diagnosis present

## 2021-02-16 DIAGNOSIS — D72829 Elevated white blood cell count, unspecified: Secondary | ICD-10-CM | POA: Diagnosis not present

## 2021-02-16 DIAGNOSIS — D5700 Hb-SS disease with crisis, unspecified: Secondary | ICD-10-CM | POA: Diagnosis not present

## 2021-02-16 DIAGNOSIS — E669 Obesity, unspecified: Secondary | ICD-10-CM | POA: Diagnosis present

## 2021-02-16 DIAGNOSIS — D571 Sickle-cell disease without crisis: Secondary | ICD-10-CM | POA: Diagnosis present

## 2021-02-16 DIAGNOSIS — Z20822 Contact with and (suspected) exposure to covid-19: Secondary | ICD-10-CM | POA: Diagnosis present

## 2021-02-16 DIAGNOSIS — E876 Hypokalemia: Secondary | ICD-10-CM | POA: Diagnosis present

## 2021-02-16 LAB — CBC WITH DIFFERENTIAL/PLATELET
Abs Immature Granulocytes: 0.02 10*3/uL (ref 0.00–0.07)
Basophils Absolute: 0 10*3/uL (ref 0.0–0.1)
Basophils Relative: 0 %
Eosinophils Absolute: 0.1 10*3/uL (ref 0.0–0.5)
Eosinophils Relative: 2 %
HCT: 31.5 % — ABNORMAL LOW (ref 36.0–46.0)
Hemoglobin: 11.4 g/dL — ABNORMAL LOW (ref 12.0–15.0)
Immature Granulocytes: 0 %
Lymphocytes Relative: 29 %
Lymphs Abs: 1.6 10*3/uL (ref 0.7–4.0)
MCH: 30.6 pg (ref 26.0–34.0)
MCHC: 36.2 g/dL — ABNORMAL HIGH (ref 30.0–36.0)
MCV: 84.5 fL (ref 80.0–100.0)
Monocytes Absolute: 1 10*3/uL (ref 0.1–1.0)
Monocytes Relative: 18 %
Neutro Abs: 2.7 10*3/uL (ref 1.7–7.7)
Neutrophils Relative %: 51 %
Platelets: 533 10*3/uL — ABNORMAL HIGH (ref 150–400)
RBC: 3.73 MIL/uL — ABNORMAL LOW (ref 3.87–5.11)
RDW: 13.9 % (ref 11.5–15.5)
WBC: 5.4 10*3/uL (ref 4.0–10.5)
nRBC: 0.4 % — ABNORMAL HIGH (ref 0.0–0.2)

## 2021-02-16 LAB — COMPREHENSIVE METABOLIC PANEL
ALT: 17 U/L (ref 0–44)
AST: 23 U/L (ref 15–41)
Albumin: 4.3 g/dL (ref 3.5–5.0)
Alkaline Phosphatase: 53 U/L (ref 38–126)
Anion gap: 8 (ref 5–15)
BUN: 9 mg/dL (ref 6–20)
CO2: 25 mmol/L (ref 22–32)
Calcium: 9.4 mg/dL (ref 8.9–10.3)
Chloride: 108 mmol/L (ref 98–111)
Creatinine, Ser: 0.54 mg/dL (ref 0.44–1.00)
GFR, Estimated: >60 mL/min (ref >60)
Glucose, Bld: 99 mg/dL (ref 70–99)
Potassium: 3.3 mmol/L — ABNORMAL LOW (ref 3.5–5.1)
Sodium: 141 mmol/L (ref 135–145)
Total Bilirubin: 1.6 mg/dL — ABNORMAL HIGH (ref 0.3–1.2)
Total Protein: 8.1 g/dL (ref 6.5–8.1)

## 2021-02-16 LAB — LACTATE DEHYDROGENASE: LDH: 125 U/L (ref 98–192)

## 2021-02-16 LAB — PREGNANCY, URINE: Preg Test, Ur: NEGATIVE

## 2021-02-16 MED ORDER — VITAMIN D (ERGOCALCIFEROL) 1.25 MG (50000 UNIT) PO CAPS
50000.0000 [IU] | ORAL_CAPSULE | ORAL | Status: DC
  Administered 2021-02-18: 50000 [IU] via ORAL
  Filled 2021-02-16: qty 1

## 2021-02-16 MED ORDER — KETOROLAC TROMETHAMINE 30 MG/ML IJ SOLN: 30.0000 mg | Freq: Four times a day (QID) | INTRAMUSCULAR | Status: DC

## 2021-02-16 MED ORDER — POTASSIUM CHLORIDE CRYS ER 20 MEQ PO TBCR
40.0000 meq | EXTENDED_RELEASE_TABLET | Freq: Once | ORAL | Status: AC
  Administered 2021-02-16: 40 meq via ORAL
  Filled 2021-02-16: qty 2

## 2021-02-16 MED ORDER — SENNOSIDES-DOCUSATE SODIUM 8.6-50 MG PO TABS
1.0000 | ORAL_TABLET | Freq: Two times a day (BID) | ORAL | Status: DC
  Administered 2021-02-16 – 2021-02-19 (×5): 1 via ORAL
  Filled 2021-02-16 (×7): qty 1

## 2021-02-16 MED ORDER — METOPROLOL SUCCINATE ER 25 MG PO TB24: 12.5000 mg | ORAL_TABLET | Freq: Every day | ORAL | Status: DC

## 2021-02-16 MED ORDER — POLYETHYLENE GLYCOL 3350 17 G PO PACK: 17.0000 g | PACK | Freq: Every day | ORAL | Status: DC | PRN

## 2021-02-16 MED ORDER — ENOXAPARIN SODIUM 40 MG/0.4ML ~~LOC~~ SOLN
40.0000 mg | SUBCUTANEOUS | Status: DC
  Administered 2021-02-18: 40 mg via SUBCUTANEOUS
  Filled 2021-02-16 (×2): qty 0.4

## 2021-02-16 MED ORDER — DEXTROSE-NACL 5-0.45 % IV SOLN: INTRAVENOUS | Status: DC

## 2021-02-16 MED ORDER — SENNOSIDES-DOCUSATE SODIUM 8.6-50 MG PO TABS: 1.0000 | ORAL_TABLET | Freq: Two times a day (BID) | ORAL | Status: DC

## 2021-02-16 MED ORDER — HYDROMORPHONE 1 MG/ML IV SOLN
INTRAVENOUS | Status: DC
  Administered 2021-02-16: 30 mg via INTRAVENOUS
  Administered 2021-02-16: 10 mg via INTRAVENOUS
  Administered 2021-02-17: 30 mg via INTRAVENOUS
  Administered 2021-02-17: 6 mg via INTRAVENOUS
  Administered 2021-02-17: 4 mg via INTRAVENOUS
  Administered 2021-02-17: 5 mg via INTRAVENOUS
  Administered 2021-02-18: 30 mg via INTRAVENOUS
  Administered 2021-02-19: 9 mg via INTRAVENOUS
  Filled 2021-02-16 (×3): qty 30

## 2021-02-16 MED ORDER — DIPHENHYDRAMINE HCL 50 MG/ML IJ SOLN
12.5000 mg | Freq: Four times a day (QID) | INTRAMUSCULAR | Status: DC | PRN
  Filled 2021-02-16: qty 1

## 2021-02-16 MED ORDER — HYDROMORPHONE 1 MG/ML IV SOLN
INTRAVENOUS | Status: DC
Start: 2021-02-16 — End: 2021-02-16

## 2021-02-16 MED ORDER — CYCLOBENZAPRINE HCL 10 MG PO TABS: 10.0000 mg | ORAL_TABLET | Freq: Every day | ORAL | Status: DC | PRN

## 2021-02-16 MED ORDER — MORPHINE SULFATE ER 30 MG PO TBCR: 60.0000 mg | EXTENDED_RELEASE_TABLET | Freq: Two times a day (BID) | ORAL | Status: DC

## 2021-02-16 MED ORDER — OXYCODONE HCL 5 MG PO TABS
10.0000 mg | ORAL_TABLET | Freq: Four times a day (QID) | ORAL | Status: DC | PRN
  Administered 2021-02-18: 10 mg via ORAL
  Filled 2021-02-16: qty 2

## 2021-02-16 MED ORDER — KETOROLAC TROMETHAMINE 30 MG/ML IJ SOLN
30.0000 mg | Freq: Four times a day (QID) | INTRAMUSCULAR | Status: DC
  Administered 2021-02-16 – 2021-02-19 (×12): 30 mg via INTRAVENOUS
  Filled 2021-02-16 (×12): qty 1

## 2021-02-16 MED ORDER — FOLIC ACID 1 MG PO TABS
1.0000 mg | ORAL_TABLET | Freq: Every day | ORAL | Status: DC
  Administered 2021-02-16 – 2021-02-19 (×4): 1 mg via ORAL
  Filled 2021-02-16 (×4): qty 1

## 2021-02-16 MED ORDER — CITALOPRAM HYDROBROMIDE 20 MG PO TABS
20.0000 mg | ORAL_TABLET | Freq: Every day | ORAL | Status: DC
  Administered 2021-02-17 – 2021-02-19 (×3): 20 mg via ORAL
  Filled 2021-02-16 (×3): qty 1

## 2021-02-16 MED ORDER — SODIUM CHLORIDE 0.9 % IV SOLN
12.5000 mg | Freq: Once | INTRAVENOUS | Status: AC
  Administered 2021-02-16: 12.5 mg via INTRAVENOUS
  Filled 2021-02-16: qty 0.5

## 2021-02-16 MED ORDER — PRENATAL MULTIVITAMIN CH
1.0000 | ORAL_TABLET | Freq: Every day | ORAL | Status: DC
  Administered 2021-02-17 – 2021-02-19 (×3): 1 via ORAL
  Filled 2021-02-16 (×3): qty 1

## 2021-02-16 MED ORDER — SODIUM CHLORIDE 0.9% FLUSH: 9.0000 mL | INTRAVENOUS | Status: DC | PRN

## 2021-02-16 MED ORDER — DIPHENHYDRAMINE HCL 12.5 MG/5ML PO ELIX
12.5000 mg | ORAL_SOLUTION | Freq: Four times a day (QID) | ORAL | Status: DC | PRN
  Administered 2021-02-16: 12.5 mg via ORAL
  Filled 2021-02-16: qty 5

## 2021-02-16 MED ORDER — NALOXONE HCL 0.4 MG/ML IJ SOLN: 0.4000 mg | INTRAMUSCULAR | Status: DC | PRN

## 2021-02-16 MED ORDER — ONDANSETRON HCL 4 MG/2ML IJ SOLN
4.0000 mg | Freq: Four times a day (QID) | INTRAMUSCULAR | Status: DC | PRN
  Administered 2021-02-16 – 2021-02-17 (×2): 4 mg via INTRAVENOUS
  Filled 2021-02-16 (×2): qty 2

## 2021-02-16 MED ORDER — DIPHENHYDRAMINE HCL 25 MG PO CAPS: 25.0000 mg | ORAL_CAPSULE | ORAL | Status: DC | PRN

## 2021-02-16 MED ORDER — MORPHINE SULFATE ER 30 MG PO TBCR
60.0000 mg | EXTENDED_RELEASE_TABLET | Freq: Two times a day (BID) | ORAL | Status: DC
  Administered 2021-02-16 – 2021-02-19 (×7): 60 mg via ORAL
  Filled 2021-02-16 (×6): qty 2
  Filled 2021-02-16: qty 1

## 2021-02-16 MED ORDER — NALOXONE HCL 0.4 MG/ML IJ SOLN
0.4000 mg | INTRAMUSCULAR | Status: DC | PRN
Start: 2021-02-16 — End: 2021-02-19

## 2021-02-16 MED ORDER — SODIUM CHLORIDE 0.9 % IV SOLN: 25.0000 mg | INTRAVENOUS | Status: DC | PRN

## 2021-02-16 MED ORDER — ONDANSETRON HCL 4 MG/2ML IJ SOLN: 4.0000 mg | Freq: Four times a day (QID) | INTRAMUSCULAR | Status: DC | PRN

## 2021-02-16 NOTE — Plan of Care (Signed)

## 2021-02-16 NOTE — Progress Notes (Signed)
PCA settings 0.5/10/3  checked with Renato Gails. RN prior to pt discharge from patient care center.

## 2021-02-16 NOTE — Telephone Encounter (Signed)
Patient called requesting to come to the day hospital for sickle cell pain. Patient reports generalized pain rated 10/10. Reports last taking Oxycodone at 10:00 pm last night but reports that she still has medication. Patient is out of MS Contin and can pick it up this Thursday. COVID-19 screening done and patient denies all symptoms and exposures. Denies fever, chest pain, nausea, vomiting, diarrhea and abdominal pain. Patient will use a Ly[employer] for transportation. Dr. Jonelle Sidle notified. Patient can come to the day hospital for pain management. Patient advised and expresses an understanding.

## 2021-02-16 NOTE — Progress Notes (Addendum)
Pt admitted to the day hospital for treatment of sickle cell pain crisis. Pt reported pain to bilateral legs and back rated 10/10. Pt given PO Benadryl, and IV toradol. Pt received IV zofran and phenergan for NV,placed on Dilaudid PCA, and hydrated with IV fluids. Received 53meq potassium PO for K+ level 3.3. Pt's pain not adequately managed today, orders placed by Dr. Jonelle Sidle for admission to [place] bed 16. Report called to [place]. Pt stable at discharge, rates pain a 8/10, taken in wheelchair by Casey Burkitt to hospital.

## 2021-02-16 NOTE — Discharge Summary (Signed)
Physician Discharge Summary  Patient ID: Carmen [name] MRN: [id number] DOB/AGE: 1991-07-07 30 y.o.  Admit date: 02/16/2021 Discharge date: 02/16/2021  Admission Diagnoses:  Discharge Diagnoses:  Active Problems:   Sickle cell anemia with crisis [facility])   Discharged Condition: good  Hospital Course: Patient admitted with sickle cell painful crisis to the day hospital.  She received IV Dilaudid PCA, Toradol [place].  Patient's pain persisted at 8 out of 10 at the end of the day.  At this point she is being discharged to the hospital and transferred to inpatient for care.  She will continue with the pain medications and current orders.  Consults: None  Significant Diagnostic Studies: labs: CBcs and CMP done and stable.  Treatments: IV hydration and analgesia: acetaminophen and Dilaudid  Discharge Exam: Blood pressure 107/67, pulse 62, temperature 98.2 F (36.8 C), temperature source Temporal, resp. rate 16, last menstrual period 01/19/2021, SpO2 100 %. General appearance: alert, cooperative, appears stated age and no distress Head: Normocephalic, without obvious abnormality, atraumatic Neck: no adenopathy, no carotid bruit, no JVD, supple, symmetrical, trachea midline and thyroid not enlarged, symmetric, no tenderness/mass/nodules Back: symmetric, no curvature. ROM normal. No CVA tenderness. Resp: clear to auscultation bilaterally Cardio: regular rate and rhythm, S1, S2 normal, no murmur, click, rub or gallop GI: soft, non-tender; bowel sounds normal; no masses,  no organomegaly Extremities: extremities normal, atraumatic, no cyanosis or edema Pulses: 2+ and symmetric Neurologic: Grossly normal  Disposition:    Allergies as of 02/16/2021   No Known Allergies     Medication List    TAKE these medications   Aspirin Low Dose 81 MG EC tablet Generic drug: aspirin Take 81 mg by mouth daily.   citalopram 20 MG tablet Commonly known as: CELEXA Take 1 tablet (20 mg total) by  mouth daily.   cyclobenzaprine 10 MG tablet Commonly known as: FLEXERIL TAKE 1 TABLET BY MOUTH TWICE A DAY What changed:   when to take this  reasons to take this   folic acid 1 MG tablet Commonly known as: FOLVITE Take 1 tablet (1 mg total) by mouth daily.   metoprolol succinate 25 MG 24 hr tablet Commonly known as: TOPROL-XL TAKE 1/2 TABLET BY MOUTH EVERY DAY What changed: how much to take   morphine 60 MG 12 hr tablet Commonly known as: MS Contin Take 1 tablet (60 mg total) by mouth every 12 (twelve) hours.   morphine 60 MG 12 hr tablet Commonly known as: MS CONTIN Take 1 tablet (60 mg total) by mouth every 12 (twelve) hours.   Oxycodone HCl 10 MG Tabs Take 1 tablet (10 mg total) by mouth every 6 (six) hours as needed for up to 15 days.   Prenatal Vitamin Plus Low Iron 27-1 MG Tabs TAKE 1 TABLET BY MOUTH EVERY DAY   Vitamin D (Ergocalciferol) 1.25 MG (50000 UNIT) Caps capsule Commonly known as: DRISDOL TAKE ONE CAPSULE BY MOUTH ONE TIME PER WEEK What changed:   how much to take  how to take this  when to take this  additional instructions        Signed: Macrina Lehnert,LAWAL 02/16/2021, 5:08 PM

## 2021-02-16 NOTE — H&P (Signed)
Carmen [name] is an 30 y.o. female.   Chief Complaint: Pain all over HPI: Patient with history of sickle cell disease who presents to the with pain on all the started about 3 days ago.  She recently lost and thus when she started having symptoms pain pain has persisted despite taking her medications.  She is now at the sickle cell sent out to be treated.  No fever no chills no nausea vomiting or diarrhea  Past Medical History:  Diagnosis Date  . Anxiety   . Headache(784.0)   . Heart murmur   . Sickle cell crisis ([place])   . Syphilis 2015   Was diagnosed and received one injection of antibiotics  . Thrombocytosis 11/22/2014     CBC Latest Ref Rng & Units 12/13/2018 12/11/2018 12/10/2018 WBC 4.0 - 10.5 K/uL 14.8(H) 13.2(H) 15.9(H) Hemoglobin 12.0 - 15.0 g/dL 8.2(L) 7.7(L) 8.1(L) Hematocrit 36.0 - 46.0 % 23.7(L) 23.2(L) 24.5(L) Platelets 150 - 400 K/uL 326 399 388      Past Surgical History:  Procedure Laterality Date  . CESAREAN SECTION N/A 02/05/2019   Procedure: CESAREAN SECTION;  Surgeon: [name], MD;  Location: [facility];  Service: Obstetrics;  Laterality: N/A;  . CHOLECYSTECTOMY N/A 11/30/2014   Procedure: LAPAROSCOPIC CHOLECYSTECTOMY SINGLE SITE WITH INTRAOPERATIVE CHOLANGIOGRAM;  Surgeon: Michael Boston, MD;  Location: [facility];  Service: General;  Laterality: N/A;  . SPLENECTOMY      Family History  Problem Relation Age of Onset  . Hypertension Mother   . Sickle cell anemia Sister   . Kidney disease Sister        Lupus  . Arthritis Sister   . Sickle cell anemia Sister   . Sickle cell trait Sister   . Heart disease Maternal Aunt        CABG  . Heart disease Maternal Uncle        CABG  . Lupus Sister    Social History:  reports that she has been smoking cigarettes. She has never used smokeless tobacco. She reports that she does not drink alcohol and does not use drugs.  Allergies: No Known Allergies  Medications Prior to Admission  Medication Sig Dispense Refill  .  ASPIRIN LOW DOSE 81 MG EC tablet Take 81 mg by mouth daily.    . citalopram (CELEXA) 20 MG tablet Take 1 tablet (20 mg total) by mouth daily. 30 tablet 11  . cyclobenzaprine (FLEXERIL) 10 MG tablet TAKE 1 TABLET BY MOUTH TWICE A DAY (Patient taking differently: Take 10 mg by mouth daily as needed for muscle spasms.) 60 tablet 2  . folic acid (FOLVITE) 1 MG tablet Take 1 tablet (1 mg total) by mouth daily. 90 tablet 3  . metoprolol succinate (TOPROL-XL) 25 MG 24 hr tablet TAKE 1/2 TABLET BY MOUTH EVERY DAY (Patient taking differently: Take by mouth daily.) 45 tablet 1  . morphine (MS CONTIN) 60 MG 12 hr tablet Take 1 tablet (60 mg total) by mouth every 12 (twelve) hours. 60 tablet 0  . morphine (MS CONTIN) 60 MG 12 hr tablet Take 1 tablet (60 mg total) by mouth every 12 (twelve) hours. 60 tablet 0  . Oxycodone HCl 10 MG TABS Take 1 tablet (10 mg total) by mouth every 6 (six) hours as needed for up to 15 days. 60 tablet 0  . Prenatal Vit-Fe Fumarate-FA (PRENATAL VITAMIN PLUS LOW IRON) 27-1 MG TABS TAKE 1 TABLET BY MOUTH EVERY DAY (Patient taking differently: Take 1 tablet by  mouth daily.) 30 tablet 13  . Vitamin D, Ergocalciferol, (DRISDOL) 1.25 MG (50000 UNIT) CAPS capsule TAKE ONE CAPSULE BY MOUTH ONE TIME PER WEEK (Patient taking differently: Take 50,000 Units by mouth every 7 (seven) days.) 30 capsule 1    No results found for this or any previous visit (from the past 48 hour(s)). No results found.  Review of Systems  Constitutional: Negative.   HENT: Negative.   Eyes: Negative.   Respiratory: Negative.   Cardiovascular: Negative.   Gastrointestinal: Negative.   Endocrine: Negative.   Genitourinary: Negative.   Musculoskeletal: Positive for back pain and myalgias.  Skin: Negative.   Neurological: Negative.   Hematological: Negative.   Psychiatric/Behavioral: Negative.     There were no vitals taken for this visit. Physical Exam Constitutional:      Appearance: She is obese. She is  ill-appearing.  HENT:     Head: Normocephalic and atraumatic.     Right Ear: Tympanic membrane normal.     Left Ear: Tympanic membrane normal.     Nose: Nose normal.     Mouth/Throat:     Mouth: Mucous membranes are moist.  Eyes:     Extraocular Movements: Extraocular movements intact.  Cardiovascular:     Rate and Rhythm: Regular rhythm. Tachycardia present.     Pulses: Normal pulses.     Heart sounds: Normal heart sounds.  Pulmonary:     Effort: Pulmonary effort is normal.     Breath sounds: Normal breath sounds.  Abdominal:     General: Bowel sounds are normal.     Palpations: Abdomen is soft.  Musculoskeletal:        General: Normal range of motion.     Cervical back: Normal range of motion and neck supple.  Skin:    General: Skin is warm and dry.  Neurological:     General: No focal deficit present.     Mental Status: She is alert.      Assessment/Plan A 30 year old female with sickle cell painful crisis.  #1 sickle cell painful crisis: Patient will be admitted to the day hospital.  Initiate Dilaudid PCA Toradol and IV fluids.  Continue her home regimen.  #2 anemia of chronic disease, continue monitoring H&H.  #3 chronic pain syndrome: Continue long-acting pain medicine while in the day hospital  #4 hypokalemia: Replete potassium  Geraldin Habermehl,LAWAL, MD 02/16/2021, 9:39 AM

## 2021-02-16 NOTE — BH Specialist Note (Signed)
Integrated Behavioral Health Visit  02/16/2021 Carmen [name]  [id number]  Number of Integrated Behavioral Health visits: 1/20 Session Start time: 10:20   End Time: 11:00 Total Time: 40  Type of Service: Behavioral Health - Individual/Family Interpreter: No.   Interpreter Name & Language: none  Subjective: Carmen [name] is a 30 y.o. female brought in by patient.  Pt./Family was referred by Crystal, King, NP for:  depression. Pt./Family reports the following symptoms/concerns: depression, currently acute grief over loss of her sister Duration of problem: post-partum depression after birth of son two years ago Severity: moderate  Objective: Mood: Depressed & Affect: Appropriate and Tearful Risk of harm to self or others: none Assessments administered: none  Patient and/or Family's Strengths/Protective Factors: Social connections, Social and Emotional competence and Concrete supports in place (healthy food, safe environments, etc.)  Goals Addressed: Patient will: 1.  Reduce symptoms of: depression  2.  Increase knowledge and/or ability of: coping skills and self-management skills  3.  Demonstrate ability to: Increase healthy adjustment to current life circumstances   Progress of Goals: Ongoing  Interventions: Interventions utilized:  Supportive Counseling Standardized Assessments completed & reviewed: Not Needed  CSW met with patient in the day hospital. Patient's sister who also had sickle cell disease died last week. Supportive counseling today to process grief and validate patient's emotional experience related to her own illness. Normalized grieving process. Patient reports spending time with family this past week and plans to stay in close contact with them. CSW and patient will plan to check in by phone and schedule next follow up appointment.  Patient and/or Family Response: Patient engaged in session. Patient tearful at times when talking about her  sister.  Assessment: Patient currently experiencing depression exacerbated by chronic illness and pain. Patient also experiencing acute grief due to the death of her sister last week.  Patient may benefit from continued supportive counseling with CBT and mindfulness to explore unhelpful thoughts about self in context of illness and to develop new coping skills.    Plan: 1. Follow up with behavioral health clinician on: follow up by phone next week 2. Referral(s): Integrated Behavioral Health Services (In Clinic)    , LCSW [facility] [phone]   

## 2021-02-16 NOTE — H&P (Signed)
Carmen [name] is an 30 y.o. female.   Chief Complaint: Pain all over  HPI: Patient with history of sickle cell disease who presents to the [facility] cell day hospital with pain which is uncontrolled.  Patient was treated for couple of hours with IV Dilaudid PCA and Toradol despite treatment she continues to have pain that is medications.  Still 8 out of 10.  Patient transferred to the inpatient service for continued care.  Pain is still at 8 out of 10.  Past Medical History:  Diagnosis Date  . Anxiety   . Headache(784.0)   . Heart murmur   . Sickle cell crisis ([place])   . Syphilis 2015   Was diagnosed and received one injection of antibiotics  . Thrombocytosis 11/22/2014     CBC Latest Ref Rng & Units 12/13/2018 12/11/2018 12/10/2018 WBC 4.0 - 10.5 K/uL 14.8(H) 13.2(H) 15.9(H) Hemoglobin 12.0 - 15.0 g/dL 8.2(L) 7.7(L) 8.1(L) Hematocrit 36.0 - 46.0 % 23.7(L) 23.2(L) 24.5(L) Platelets 150 - 400 K/uL 326 399 388      Past Surgical History:  Procedure Laterality Date  . CESAREAN SECTION N/A 02/05/2019   Procedure: CESAREAN SECTION;  Surgeon: [name], MD;  Location: [facility];  Service: Obstetrics;  Laterality: N/A;  . CHOLECYSTECTOMY N/A 11/30/2014   Procedure: LAPAROSCOPIC CHOLECYSTECTOMY SINGLE SITE WITH INTRAOPERATIVE CHOLANGIOGRAM;  Surgeon: Michael Boston, MD;  Location: [facility];  Service: General;  Laterality: N/A;  . SPLENECTOMY      Family History  Problem Relation Age of Onset  . Hypertension Mother   . Sickle cell anemia Sister   . Kidney disease Sister        Lupus  . Arthritis Sister   . Sickle cell anemia Sister   . Sickle cell trait Sister   . Heart disease Maternal Aunt        CABG  . Heart disease Maternal Uncle        CABG  . Lupus Sister    Social History:  reports that she has been smoking cigarettes. She has never used smokeless tobacco. She reports that she does not drink alcohol and does not use drugs.  Allergies: No Known Allergies  Medications Prior to  Admission  Medication Sig Dispense Refill  . ASPIRIN LOW DOSE 81 MG EC tablet Take 81 mg by mouth daily.    . citalopram (CELEXA) 20 MG tablet Take 1 tablet (20 mg total) by mouth daily. 30 tablet 11  . cyclobenzaprine (FLEXERIL) 10 MG tablet TAKE 1 TABLET BY MOUTH TWICE A DAY (Patient taking differently: Take 10 mg by mouth daily as needed for muscle spasms.) 60 tablet 2  . folic acid (FOLVITE) 1 MG tablet Take 1 tablet (1 mg total) by mouth daily. 90 tablet 3  . morphine (MS CONTIN) 60 MG 12 hr tablet Take 1 tablet (60 mg total) by mouth every 12 (twelve) hours. 60 tablet 0  . Oxycodone HCl 10 MG TABS Take 1 tablet (10 mg total) by mouth every 6 (six) hours as needed for up to 15 days. 60 tablet 0  . Prenatal Vit-Fe Fumarate-FA (PRENATAL VITAMIN PLUS LOW IRON) 27-1 MG TABS TAKE 1 TABLET BY MOUTH EVERY DAY (Patient taking differently: Take 1 tablet by mouth daily.) 30 tablet 13  . Vitamin D, Ergocalciferol, (DRISDOL) 1.25 MG (50000 UNIT) CAPS capsule TAKE ONE CAPSULE BY MOUTH ONE TIME PER WEEK (Patient taking differently: Take 50,000 Units by mouth every 7 (seven) days.) 30 capsule 1  . metoprolol succinate (  TOPROL-XL) 25 MG 24 hr tablet TAKE 1/2 TABLET BY MOUTH EVERY DAY (Patient taking differently: Take by mouth daily.) 45 tablet 1  . morphine (MS CONTIN) 60 MG 12 hr tablet Take 1 tablet (60 mg total) by mouth every 12 (twelve) hours. 60 tablet 0    Results for orders placed or performed during the hospital encounter of 02/16/21 (from the past 48 hour(s))  Lactate dehydrogenase     Status: None   Collection Time: 02/16/21  9:45 AM  Result Value Ref Range   LDH 125 98 - 192 U/L    Comment: Performed at [facility], [place] [address], [place], [place] [address]  CBC WITH DIFFERENTIAL     Status: Abnormal   Collection Time: 02/16/21  9:45 AM  Result Value Ref Range   WBC 5.4 4.0 - 10.5 K/uL   RBC 3.73 (L) 3.87 - 5.11 MIL/uL   Hemoglobin 11.4 (L) 12.0 - 15.0 g/dL   HCT 31.5  (L) 36.0 - 46.0 %   MCV 84.5 80.0 - 100.0 fL   MCH 30.6 26.0 - 34.0 pg   MCHC 36.2 (H) 30.0 - 36.0 g/dL   RDW 13.9 11.5 - 15.5 %   Platelets 533 (H) 150 - 400 K/uL   nRBC 0.4 (H) 0.0 - 0.2 %   Neutrophils Relative % 51 %   Neutro Abs 2.7 1.7 - 7.7 K/uL   Lymphocytes Relative 29 %   Lymphs Abs 1.6 0.7 - 4.0 K/uL   Monocytes Relative 18 %   Monocytes Absolute 1.0 0.1 - 1.0 K/uL   Eosinophils Relative 2 %   Eosinophils Absolute 0.1 0.0 - 0.5 K/uL   Basophils Relative 0 %   Basophils Absolute 0.0 0.0 - 0.1 K/uL   Immature Granulocytes 0 %   Abs Immature Granulocytes 0.02 0.00 - 0.07 K/uL    Comment: Performed at [facility], [place] [address], [place], [place] [address]  Comprehensive metabolic panel     Status: Abnormal   Collection Time: 02/16/21  9:45 AM  Result Value Ref Range   Sodium 141 135 - 145 mmol/L   Potassium 3.3 (L) 3.5 - 5.1 mmol/L   Chloride 108 98 - 111 mmol/L   CO2 25 22 - 32 mmol/L   Glucose, Bld 99 70 - 99 mg/dL    Comment: Glucose reference range applies only to samples taken after fasting for at least 8 hours.   BUN 9 6 - 20 mg/dL   Creatinine, Ser 0.54 0.44 - 1.00 mg/dL   Calcium 9.4 8.9 - 10.3 mg/dL   Total Protein 8.1 6.5 - 8.1 g/dL   Albumin 4.3 3.5 - 5.0 g/dL   AST 23 15 - 41 U/L   ALT 17 0 - 44 U/L   Alkaline Phosphatase 53 38 - 126 U/L   Total Bilirubin 1.6 (H) 0.3 - 1.2 mg/dL   GFR, Estimated >60 >60 mL/min    Comment: (NOTE) Calculated using the CKD-EPI Creatinine Equation (2021)    Anion gap 8 5 - 15    Comment: Performed at [facility], [place] [address] [place], [place] [address]  Pregnancy, urine     Status: None   Collection Time: 02/16/21 11:00 AM  Result Value Ref Range   Preg Test, Ur NEGATIVE NEGATIVE    Comment:        THE SENSITIVITY OF THIS METHODOLOGY IS >20 mIU/mL. Performed at [facility], [place] [address], [place], [place] [address]  No results found.  Review of  Systems  Constitutional: Negative.   HENT: Negative.   Eyes: Negative.   Respiratory: Negative.   Cardiovascular: Negative.   Gastrointestinal: Negative.   Endocrine: Negative.   Genitourinary: Negative.   Musculoskeletal: Positive for back pain and myalgias.  Skin: Negative.   Neurological: Negative.   Hematological: Negative.   Psychiatric/Behavioral: Negative.     Blood pressure 116/67, pulse (!) 59, temperature 98.1 F (36.7 C), temperature source Oral, resp. rate 18, last menstrual period 01/19/2021, SpO2 100 %. Physical Exam Constitutional:      Appearance: She is obese. She is ill-appearing.  HENT:     Head: Normocephalic and atraumatic.     Right Ear: Tympanic membrane normal.     Left Ear: Tympanic membrane normal.     Nose: Nose normal.     Mouth/Throat:     Mouth: Mucous membranes are moist.  Eyes:     Extraocular Movements: Extraocular movements intact.  Cardiovascular:     Rate and Rhythm: Regular rhythm. Tachycardia present.     Pulses: Normal pulses.     Heart sounds: Normal heart sounds.  Pulmonary:     Effort: Pulmonary effort is normal.     Breath sounds: Normal breath sounds.  Abdominal:     General: Bowel sounds are normal.     Palpations: Abdomen is soft.  Musculoskeletal:        General: Normal range of motion.     Cervical back: Normal range of motion and neck supple.  Skin:    General: Skin is warm and dry.  Neurological:     General: No focal deficit present.     Mental Status: She is alert.      Assessment/Plan A 30 year old female with sickle cell painful crisis.  #1 sickle cell painful crisis: Patient will be maintained on Dilaudid PCA, Toradol and IV fluids.  Long-acting pain medicine from home will continue.  Close monitoring of her pain.   #2 anemia of chronic disease:, continue monitoring H&H.  #3 chronic pain syndrome: Continue long-acting pain medicine   #4 hypokalemia: Replete potassium  Jenavie Stanczak,LAWAL, MD 02/16/2021, 5:46  PM

## 2021-02-17 ENCOUNTER — Telehealth: Payer: Self-pay

## 2021-02-17 LAB — CBC WITH DIFFERENTIAL/PLATELET
Abs Immature Granulocytes: 0.05 10*3/uL (ref 0.00–0.07)
Basophils Absolute: 0 10*3/uL (ref 0.0–0.1)
Basophils Relative: 0 %
Eosinophils Absolute: 0.3 10*3/uL (ref 0.0–0.5)
Eosinophils Relative: 3 %
HCT: 28.4 % — ABNORMAL LOW (ref 36.0–46.0)
Hemoglobin: 10.1 g/dL — ABNORMAL LOW (ref 12.0–15.0)
Immature Granulocytes: 1 %
Lymphocytes Relative: 41 %
Lymphs Abs: 4.1 10*3/uL — ABNORMAL HIGH (ref 0.7–4.0)
MCH: 30.1 pg (ref 26.0–34.0)
MCHC: 35.6 g/dL (ref 30.0–36.0)
MCV: 84.8 fL (ref 80.0–100.0)
Monocytes Absolute: 1.3 10*3/uL — ABNORMAL HIGH (ref 0.1–1.0)
Monocytes Relative: 14 %
Neutro Abs: 4 10*3/uL (ref 1.7–7.7)
Neutrophils Relative %: 41 %
Platelets: 411 10*3/uL — ABNORMAL HIGH (ref 150–400)
RBC: 3.35 MIL/uL — ABNORMAL LOW (ref 3.87–5.11)
RDW: 14 % (ref 11.5–15.5)
WBC: 9.7 10*3/uL (ref 4.0–10.5)
nRBC: 0.2 % (ref 0.0–0.2)

## 2021-02-17 LAB — COMPREHENSIVE METABOLIC PANEL
ALT: 15 U/L (ref 0–44)
AST: 31 U/L (ref 15–41)
Albumin: 3.5 g/dL (ref 3.5–5.0)
Alkaline Phosphatase: 46 U/L (ref 38–126)
Anion gap: 8 (ref 5–15)
BUN: 8 mg/dL (ref 6–20)
CO2: 23 mmol/L (ref 22–32)
Calcium: 8.7 mg/dL — ABNORMAL LOW (ref 8.9–10.3)
Chloride: 104 mmol/L (ref 98–111)
Creatinine, Ser: 0.48 mg/dL (ref 0.44–1.00)
GFR, Estimated: >60 mL/min (ref >60)
Glucose, Bld: 109 mg/dL — ABNORMAL HIGH (ref 70–99)
Potassium: 4 mmol/L (ref 3.5–5.1)
Sodium: 135 mmol/L (ref 135–145)
Total Bilirubin: 1.3 mg/dL — ABNORMAL HIGH (ref 0.3–1.2)
Total Protein: 6.5 g/dL (ref 6.5–8.1)

## 2021-02-17 LAB — SARS CORONAVIRUS 2 (TAT 6-24 HRS): SARS Coronavirus 2: NEGATIVE

## 2021-02-17 NOTE — Plan of Care (Signed)
  Problem: Self-Care: Goal: Ability to incorporate actions that prevent/reduce pain crisis will improve Outcome: Progressing   Problem: Bowel/Gastric: Goal: Gut motility will be maintained Outcome: Progressing   Problem: Self-Care: Goal: Ability to incorporate actions that prevent/reduce pain crisis will improve Outcome: Progressing   Problem: Bowel/Gastric: Goal: Gut motility will be maintained Outcome: Progressing

## 2021-02-17 NOTE — Progress Notes (Signed)
Subjective: A 30 year old female admitted with sickle cell painful crisis.  Patient is still having pain at 9 out of 10 in her legs and back.  No fever no chills no nausea vomiting or diarrhea.  Still on Dilaudid PCA Toradol and IV fluids  Objective: Vital signs in last 24 hours: Temp:  [98 F (36.7 C)-98.8 F (37.1 C)] 98 F (36.7 C) (04/19 0344) Pulse Rate:  [59-77] 75 (04/19 0344) Resp:  [10-20] 14 (04/19 0415) BP: (105-120)/(65-72) 105/72 (04/19 0344) SpO2:  [95 %-100 %] 100 % (04/19 0415) Weight change:  Last BM Date: 02/16/21  Intake/Output from previous day: 04/18 0701 - 04/19 0700 In: 1039.2 [I.V.:989.2; IV Piggyback:50] Out: -  Intake/Output this shift: No intake/output data recorded.  General appearance: alert, cooperative, appears stated age and no distress Head: Normocephalic, without obvious abnormality, atraumatic Neck: no adenopathy, no carotid bruit, no JVD, supple, symmetrical, trachea midline and thyroid not enlarged, symmetric, no tenderness/mass/nodules Back: symmetric, no curvature. ROM normal. No CVA tenderness. Resp: clear to auscultation bilaterally Cardio: regular rate and rhythm, S1, S2 normal, no murmur, click, rub or gallop GI: soft, non-tender; bowel sounds normal; no masses,  no organomegaly Extremities: extremities normal, atraumatic, no cyanosis or edema Pulses: 2+ and symmetric Skin: Skin color, texture, turgor normal. No rashes or lesions Neurologic: Grossly normal  Lab Results: Recent Labs    02/16/21 0945 02/17/21 0546  WBC 5.4 9.7  HGB 11.4* 10.1*  HCT 31.5* 28.4*  PLT 533* 411*   BMET Recent Labs    02/16/21 0945 02/17/21 0546  NA 141 135  K 3.3* 4.0  CL 108 104  CO2 25 23  GLUCOSE 99 109*  BUN 9 8  CREATININE 0.54 0.48  CALCIUM 9.4 8.7*    Studies/Results: No results found.  Medications: I have reviewed the patient's current medications.  Assessment/Plan: A 30 year old admitted with sickle cell painful  crisis.  #1 sickle cell painful crisis: Patient still having significant pain.  Some improvement.  Continue with Dilaudid PCA Toradol IV fluids and continue to titrate.  #2 anemia of chronic disease: Hemoglobin has been 11 currently at 10.  Hemodilution most likely.  Appears to be at baseline.  Continue to monitor.  #3 hypokalemia: Potassium has been repleted.  #4 chronic pain syndrome: Continue home regimen.  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  Continue monitoring  LOS: 1 day   Takaya Hyslop,LAWAL 02/17/2021, 8:00 AM

## 2021-02-17 NOTE — Telephone Encounter (Signed)
Med refill   Oxycodone 10 mg Morphine

## 2021-02-18 ENCOUNTER — Other Ambulatory Visit: Payer: Self-pay

## 2021-02-18 LAB — COMPREHENSIVE METABOLIC PANEL
ALT: 23 U/L (ref 0–44)
AST: 33 U/L (ref 15–41)
Albumin: 4.2 g/dL (ref 3.5–5.0)
Alkaline Phosphatase: 49 U/L (ref 38–126)
Anion gap: 7 (ref 5–15)
BUN: 7 mg/dL (ref 6–20)
CO2: 24 mmol/L (ref 22–32)
Calcium: 8.9 mg/dL (ref 8.9–10.3)
Chloride: 106 mmol/L (ref 98–111)
Creatinine, Ser: 0.45 mg/dL (ref 0.44–1.00)
GFR, Estimated: >60 mL/min (ref >60)
Glucose, Bld: 77 mg/dL (ref 70–99)
Potassium: 3.6 mmol/L (ref 3.5–5.1)
Sodium: 137 mmol/L (ref 135–145)
Total Bilirubin: 1.7 mg/dL — ABNORMAL HIGH (ref 0.3–1.2)
Total Protein: 7.5 g/dL (ref 6.5–8.1)

## 2021-02-18 LAB — CBC WITH DIFFERENTIAL/PLATELET
Abs Immature Granulocytes: 0.02 10*3/uL (ref 0.00–0.07)
Basophils Absolute: 0 10*3/uL (ref 0.0–0.1)
Basophils Relative: 1 %
Eosinophils Absolute: 0.3 10*3/uL (ref 0.0–0.5)
Eosinophils Relative: 4 %
HCT: 28.5 % — ABNORMAL LOW (ref 36.0–46.0)
Hemoglobin: 10.1 g/dL — ABNORMAL LOW (ref 12.0–15.0)
Immature Granulocytes: 0 %
Lymphocytes Relative: 52 %
Lymphs Abs: 4 10*3/uL (ref 0.7–4.0)
MCH: 30.1 pg (ref 26.0–34.0)
MCHC: 35.4 g/dL (ref 30.0–36.0)
MCV: 85.1 fL (ref 80.0–100.0)
Monocytes Absolute: 1.2 10*3/uL — ABNORMAL HIGH (ref 0.1–1.0)
Monocytes Relative: 15 %
Neutro Abs: 2.2 10*3/uL (ref 1.7–7.7)
Neutrophils Relative %: 28 %
Platelets: 393 10*3/uL (ref 150–400)
RBC: 3.35 MIL/uL — ABNORMAL LOW (ref 3.87–5.11)
RDW: 14.6 % (ref 11.5–15.5)
WBC: 7.7 10*3/uL (ref 4.0–10.5)
nRBC: 0 % (ref 0.0–0.2)

## 2021-02-18 MED ORDER — MELATONIN 5 MG PO TABS
5.0000 mg | ORAL_TABLET | Freq: Every evening | ORAL | Status: DC | PRN
  Administered 2021-02-18: 5 mg via ORAL
  Filled 2021-02-18: qty 1

## 2021-02-18 NOTE — Telephone Encounter (Signed)
Patient hospitalized

## 2021-02-18 NOTE — Progress Notes (Signed)
Subjective: Patient is improved but still has significant pain.  She rates her pain as 7 out of 10.  It is mainly at the back now.  The leg is getting better.  No fever or chills no nausea vomiting or diarrhea.  She is mourning her sister that recently passed away and that is worsening her pain.  Objective: Vital signs in last 24 hours: Temp:  [97.5 F (36.4 C)-98.3 F (36.8 C)] 97.5 F (36.4 C) (04/20 2019) Pulse Rate:  [64-71] 69 (04/20 2019) Resp:  [12-18] 18 (04/20 2019) BP: (121-142)/(68-80) 142/71 (04/20 2019) SpO2:  [97 %-99 %] 97 % (04/20 2019) Weight change:  Last BM Date: 02/16/21  Intake/Output from previous day: 04/19 0701 - 04/20 0700 In: 240 [P.O.:240] Out: -  Intake/Output this shift: No intake/output data recorded.  General appearance: alert, cooperative, appears stated age and no distress Head: Normocephalic, without obvious abnormality, atraumatic Neck: no adenopathy, no carotid bruit, no JVD, supple, symmetrical, trachea midline and thyroid not enlarged, symmetric, no tenderness/mass/nodules Back: symmetric, no curvature. ROM normal. No CVA tenderness. Resp: clear to auscultation bilaterally Cardio: regular rate and rhythm, S1, S2 normal, no murmur, click, rub or gallop GI: soft, non-tender; bowel sounds normal; no masses,  no organomegaly Extremities: extremities normal, atraumatic, no cyanosis or edema Pulses: 2+ and symmetric Skin: Skin color, texture, turgor normal. No rashes or lesions Neurologic: Grossly normal  Lab Results: Recent Labs    02/17/21 0546 02/18/21 0901  WBC 9.7 7.7  HGB 10.1* 10.1*  HCT 28.4* 28.5*  PLT 411* 393   BMET Recent Labs    02/17/21 0546 02/18/21 0901  NA 135 137  K 4.0 3.6  CL 104 106  CO2 23 24  GLUCOSE 109* 77  BUN 8 7  CREATININE 0.48 0.45  CALCIUM 8.7* 8.9    Studies/Results: No results found.  Medications: I have reviewed the patient's current medications.  Assessment/Plan: A 30 year old admitted  with sickle cell painful crisis.  #1 sickle cell painful crisis: Patient has been encouraged to continue to move around and use her PCA.  She is showing some improvement.  Continue with Dilaudid PCA Toradol IV fluids and continue to titrate.  #2 anemia of chronic disease: Hemoglobin has been stable at 10.    Appears to be at baseline.  Continue to monitor.  #3 hypokalemia: Potassium has been repleted.  #4 chronic pain syndrome: Continue home regimen.  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  Continue monitoring  LOS: 2 days   Rivkah Wolz,LAWAL 02/18/2021, 10:45 PM

## 2021-02-19 ENCOUNTER — Other Ambulatory Visit: Payer: Self-pay

## 2021-02-19 ENCOUNTER — Other Ambulatory Visit: Payer: Self-pay | Admitting: Nurse Practitioner

## 2021-02-19 DIAGNOSIS — Z79891 Long term (current) use of opiate analgesic: Secondary | ICD-10-CM

## 2021-02-19 DIAGNOSIS — D571 Sickle-cell disease without crisis: Secondary | ICD-10-CM

## 2021-02-19 MED ORDER — MORPHINE SULFATE ER 60 MG PO TBCR: 60.0000 mg | EXTENDED_RELEASE_TABLET | Freq: Two times a day (BID) | ORAL | 0 refills | Status: DC

## 2021-02-19 MED ORDER — OXYCODONE HCL 10 MG PO TABS: 10.0000 mg | ORAL_TABLET | Freq: Four times a day (QID) | ORAL | 0 refills | Status: DC | PRN

## 2021-02-19 NOTE — Discharge Summary (Signed)
Physician Discharge Summary  Patient ID: MINDI AKERSON MRN: [id number] DOB/AGE: 1991/10/18 30 y.o.  Admit date: 02/16/2021 Discharge date: 02/24/2021  Admission Diagnoses:  Discharge Diagnoses:  Active Problems:   Sickle cell anemia of mother during pregnancy ([place])   Hypokalemia   Leukocytosis   Sickle cell anemia with crisis [facility])   Discharged Condition: good  Hospital Course: Patient with history of sickle cell disease who was admitted with sickle cell painful crisis.  Patient is mourning her sister.  Came into the ER with pain rated as 10 out of 10 in her legs and back.  She was depressed in tears.  Was admitted and initiated on Dilaudid PCA, Toradol, IV fluids.  Patient did much better.  At the time of discharge pain is back to baseline.  She is still feeling depressed but patient much better able to cope with her symptoms at home.  Transition to oral regimen on discharge home.  Consults: None  Significant Diagnostic Studies: labs: CBC is 1 CMP is checked.  Did not require any transfusion  Treatments: IV hydration and analgesia: Dilaudid  Discharge Exam: Blood pressure 125/84, pulse 74, temperature 98.3 F (36.8 C), temperature source Oral, resp. rate 15, last menstrual period 01/19/2021, SpO2 95 %. General appearance: alert, cooperative, appears stated age and no distress Neck: no adenopathy, no carotid bruit, no JVD, supple, symmetrical, trachea midline and thyroid not enlarged, symmetric, no tenderness/mass/nodules Back: symmetric, no curvature. ROM normal. No CVA tenderness. Resp: clear to auscultation bilaterally Cardio: regular rate and rhythm, S1, S2 normal, no murmur, click, rub or gallop GI: soft, non-tender; bowel sounds normal; no masses,  no organomegaly Extremities: extremities normal, atraumatic, no cyanosis or edema Neurologic: Grossly normal  Disposition: Discharge disposition: 01-Home or Self Care       Discharge Instructions    Diet - low sodium  heart healthy   Complete by: As directed    Increase activity slowly   Complete by: As directed      Allergies as of 02/19/2021   No Known Allergies     Medication List    TAKE these medications   Aspirin Low Dose 81 MG EC tablet Generic drug: aspirin Take 81 mg by mouth daily.   cyclobenzaprine 10 MG tablet Commonly known as: FLEXERIL TAKE 1 TABLET BY MOUTH TWICE A DAY What changed:   when to take this  reasons to take this   folic acid 1 MG tablet Commonly known as: FOLVITE Take 1 tablet (1 mg total) by mouth daily.   metoprolol succinate 25 MG 24 hr tablet Commonly known as: TOPROL-XL TAKE 1/2 TABLET BY MOUTH EVERY DAY   Prenatal Vitamin Plus Low Iron 27-1 MG Tabs TAKE 1 TABLET BY MOUTH EVERY DAY   Vitamin D (Ergocalciferol) 1.25 MG (50000 UNIT) Caps capsule Commonly known as: DRISDOL TAKE ONE CAPSULE BY MOUTH ONE TIME PER WEEK What changed:   how much to take  how to take this  when to take this  additional instructions        Signed: Brennan Litzinger,LAWAL 02/24/2021, 11:27 PM   Time spent 32 minutes

## 2021-02-24 ENCOUNTER — Other Ambulatory Visit: Payer: Self-pay | Admitting: Nurse Practitioner

## 2021-02-24 ENCOUNTER — Telehealth: Payer: Self-pay | Admitting: Clinical

## 2021-02-24 NOTE — Telephone Encounter (Signed)
[facility] General Follow Up Note  02/24/2021 Name: JAMYRIA OZANICH         MRN: [id number]       DOB: 27-Dec-1990 LYNSI DOONER is a 30 y.o. year old female who sees Vevelyn Francois, NP for primary care. LCSW was initially consulted to support patient with depression, anxiety, and access to community resources.   Interpreter: No. Interpreter Name & Language: none  Assessment: Patient experiencing depression and anxiety. Patient also now experiencing acute grief over the recent loss of her sister.   Ongoing Intervention: CSW called patient and briefly checked in, as patient recently lost her sister. Patient and CSW planned to schedule follow up [place] ([facility]) appointment later on, as patient not ready to schedule at this time.   Review of patient status, including review of consultants reports, relevant laboratory and other test results, and collaboration with appropriate care team members and the patient's provider was performed as part of comprehensive patient evaluation and provision of services.     Follow up Plan: 1. CSW to follow  Estanislado Emms, [facility] [phone]

## 2021-03-02 ENCOUNTER — Other Ambulatory Visit: Payer: Self-pay

## 2021-03-02 ENCOUNTER — Encounter (HOSPITAL_COMMUNITY): Payer: Self-pay

## 2021-03-02 ENCOUNTER — Emergency Department (HOSPITAL_COMMUNITY)
Admission: EM | Admit: 2021-03-02 | Discharge: 2021-03-02 | Disposition: A | Discharge status: Home / Self Care | Payer: Medicaid Other | Attending: Emergency Medicine | Admitting: Emergency Medicine

## 2021-03-02 DIAGNOSIS — Z7982 Long term (current) use of aspirin: Secondary | ICD-10-CM | POA: Insufficient documentation

## 2021-03-02 DIAGNOSIS — F17210 Nicotine dependence, cigarettes, uncomplicated: Secondary | ICD-10-CM | POA: Insufficient documentation

## 2021-03-02 DIAGNOSIS — D5700 Hb-SS disease with crisis, unspecified: Secondary | ICD-10-CM | POA: Diagnosis not present

## 2021-03-02 DIAGNOSIS — M79601 Pain in right arm: Secondary | ICD-10-CM | POA: Diagnosis present

## 2021-03-02 LAB — BASIC METABOLIC PANEL WITH GFR
Anion gap: 6 (ref 5–15)
BUN: 7 mg/dL (ref 6–20)
CO2: 24 mmol/L (ref 22–32)
Calcium: 9.1 mg/dL (ref 8.9–10.3)
Chloride: 109 mmol/L (ref 98–111)
Creatinine, Ser: 0.55 mg/dL (ref 0.44–1.00)
GFR, Estimated: >60 mL/min (ref >60)
Glucose, Bld: 88 mg/dL (ref 70–99)
Potassium: 3.4 mmol/L — ABNORMAL LOW (ref 3.5–5.1)
Sodium: 139 mmol/L (ref 135–145)

## 2021-03-02 LAB — CBC WITH DIFFERENTIAL/PLATELET
Abs Immature Granulocytes: 0.06 K/uL (ref 0.00–0.07)
Basophils Absolute: 0 K/uL (ref 0.0–0.1)
Basophils Relative: 0 %
Eosinophils Absolute: 0.1 K/uL (ref 0.0–0.5)
Eosinophils Relative: 1 %
HCT: 25.6 % — ABNORMAL LOW (ref 36.0–46.0)
Hemoglobin: 8.9 g/dL — ABNORMAL LOW (ref 12.0–15.0)
Immature Granulocytes: 1 %
Lymphocytes Relative: 37 %
Lymphs Abs: 3.1 K/uL (ref 0.7–4.0)
MCH: 30.2 pg (ref 26.0–34.0)
MCHC: 34.8 g/dL (ref 30.0–36.0)
MCV: 86.8 fL (ref 80.0–100.0)
Monocytes Absolute: 0.8 K/uL (ref 0.1–1.0)
Monocytes Relative: 10 %
Neutro Abs: 4.2 K/uL (ref 1.7–7.7)
Neutrophils Relative %: 51 %
Platelets: 462 K/uL — ABNORMAL HIGH (ref 150–400)
RBC: 2.95 MIL/uL — ABNORMAL LOW (ref 3.87–5.11)
RDW: 17.2 % — ABNORMAL HIGH (ref 11.5–15.5)
WBC: 8.3 K/uL (ref 4.0–10.5)
nRBC: 0.8 % — ABNORMAL HIGH (ref 0.0–0.2)

## 2021-03-02 LAB — RETICULOCYTES
Immature Retic Fract: 41.6 % — ABNORMAL HIGH (ref 2.3–15.9)
RBC.: 2.91 MIL/uL — ABNORMAL LOW (ref 3.87–5.11)
Retic Count, Absolute: 222.3 K/uL — ABNORMAL HIGH (ref 19.0–186.0)
Retic Ct Pct: 7.6 % — ABNORMAL HIGH (ref 0.4–3.1)

## 2021-03-02 LAB — I-STAT BETA HCG BLOOD, ED (MC, WL, AP ONLY): I-stat hCG, quantitative: <5 m[IU]/mL (ref <5)

## 2021-03-02 MED ORDER — PROMETHAZINE HCL 25 MG PO TABS
25.0000 mg | ORAL_TABLET | ORAL | Status: DC | PRN
  Administered 2021-03-02: 25 mg via ORAL
  Filled 2021-03-02: qty 1

## 2021-03-02 MED ORDER — MORPHINE SULFATE 30 MG PO TABS
30.0000 mg | ORAL_TABLET | Freq: Once | ORAL | Status: AC
Start: 2021-03-02 — End: 2021-03-02
  Administered 2021-03-02: 30 mg via ORAL
  Filled 2021-03-02: qty 1

## 2021-03-02 MED ORDER — HYDROMORPHONE HCL 2 MG/ML IJ SOLN
2.0000 mg | INTRAMUSCULAR | Status: AC
  Administered 2021-03-02: 2 mg via INTRAVENOUS
  Filled 2021-03-02: qty 1

## 2021-03-02 MED ORDER — DEXTROSE-NACL 5-0.45 % IV SOLN: INTRAVENOUS | Status: DC

## 2021-03-02 MED ORDER — DIPHENHYDRAMINE HCL 25 MG PO CAPS
25.0000 mg | ORAL_CAPSULE | ORAL | Status: DC | PRN
  Administered 2021-03-02: 25 mg via ORAL
  Filled 2021-03-02: qty 1

## 2021-03-02 MED ORDER — KETOROLAC TROMETHAMINE 15 MG/ML IJ SOLN
15.0000 mg | INTRAMUSCULAR | Status: AC
  Administered 2021-03-02: 15 mg via INTRAVENOUS
  Filled 2021-03-02: qty 1

## 2021-03-02 MED ORDER — HYDROMORPHONE HCL 2 MG/ML IJ SOLN
2.0000 mg | INTRAMUSCULAR | Status: AC
Start: 2021-03-02 — End: 2021-03-02
  Administered 2021-03-02: 2 mg via INTRAVENOUS
  Filled 2021-03-02: qty 1

## 2021-03-02 NOTE — ED Triage Notes (Signed)
Emergency Medicine Provider Triage Evaluation Note  CAMIKA MARSICO , a 30 y.o. female  was evaluated in triage.  Pt complains of bilateral, R>L forearm pain ss crisis. Onset with arm and leg pain yesterday, today with arm pain. Called SS clinic today but they were full. No relief with 60mg  Morphine and 10mg  Oxycodone at home. No fevers.  Review of Systems  Positive: Arm pain and swelling Negative: Fever, CP  Physical Exam  BP 131/72 (BP Location: Left Arm)   Pulse 84   Temp 99.2 F (37.3 C) (Oral)   Resp 20   SpO2 95%  Gen:   Awake, no distress   HEENT:  Atraumatic  Resp:  Normal effort  Cardiac:  Normal rate  Abd:   Nondistended, nontender  MSK:   Moves extremities without difficulty  Neuro:  Speech clear   Medical Decision Making  Medically screening exam initiated at 3:05 PM.  Appropriate orders placed.  WILLADENE MOUNSEY was informed that the remainder of the evaluation will be completed by another provider, this initial triage assessment does not replace that evaluation, and the importance of remaining in the ED until their evaluation is complete.  Clinical Impression     Tacy Learn, PA-C 03/02/21 1506

## 2021-03-02 NOTE — ED Provider Notes (Signed)
[facility] [place] DEPT Provider Note   CSN: [id number] Arrival date & time: 03/02/21  1441     History Chief Complaint  Patient presents with  . Sickle Cell Pain Crisis    Carmen [name] is a 30 y.o. female.  HPI Patient reports she started to get right arm pain today.  She reports pain is consistent with her sickle cell pain.  Is aching all way from the shoulder to the hand.  No weakness or numbness or difficulty using the extremity.  She reports she did have pain in both of her legs 2 days ago but was able to control the pain with her home medication regimen.  She is not having leg pain today.  She does not have any chest pain.  She denies any shortness of breath, she denies fever, she denies cough.  She denies abdominal pain she denies vomiting or diarrhea.  Last normal menstrual cycle 4 days ago.    Past Medical History:  Diagnosis Date  . Anxiety   . Headache(784.0)   . Heart murmur   . Sickle cell crisis ([place])   . Syphilis 2015   Was diagnosed and received one injection of antibiotics  . Thrombocytosis 11/22/2014     CBC Latest Ref Rng & Units 12/13/2018 12/11/2018 12/10/2018 WBC 4.0 - 10.5 K/uL 14.8(H) 13.2(H) 15.9(H) Hemoglobin 12.0 - 15.0 g/dL 8.2(L) 7.7(L) 8.1(L) Hematocrit 36.0 - 46.0 % 23.7(L) 23.2(L) 24.5(L) Platelets 150 - 400 K/uL 326 399 388      Patient Active Problem List   Diagnosis Date Noted  . Sickle cell anemia with crisis ([place]) 02/16/2021  . Acute chest syndrome ([place]) 01/18/2021  . Community acquired pneumonia of right lower lobe of lung   . Fever of unknown origin 10/14/2020  . Sickle cell crisis ([place]) 03/31/2020  . Bacterial vaginitis 01/04/2020  . Acute cystitis without hematuria   . Dysuria 12/31/2019  . Urine leukocytes   . Sickle cell pain crisis ([place]) 09/10/2019  . Sickle cell disease ([place]) 12/04/2018  . Alpha thalassemia silent carrier 12/04/2018  . Chronic prescription opiate use 03/13/2018  . Chronic musculoskeletal  pain 03/13/2018  . Vitamin D deficiency 01/13/2018  . Leukocytosis 08/21/2017  . Hypokalemia 06/27/2017  . 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 Hb-SS disease without crisis ([place]) 08/15/2016  . Anemia of chronic disease   . Chest pain 11/10/2015  . Systolic ejection murmur 95/63/8756  . Sickle cell anemia of mother during pregnancy [facility]) 05/16/2014    Past Surgical History:  Procedure Laterality Date  . CESAREAN SECTION N/A 02/05/2019   Procedure: CESAREAN SECTION;  Surgeon: [name], MD;  Location: [facility] LD ORS;  Service: Obstetrics;  Laterality: N/A;  . CHOLECYSTECTOMY N/A 11/30/2014   Procedure: LAPAROSCOPIC CHOLECYSTECTOMY SINGLE SITE WITH INTRAOPERATIVE CHOLANGIOGRAM;  Surgeon: Michael Boston, MD;  Location: [facility] ORS;  Service: General;  Laterality: N/A;  . SPLENECTOMY       OB History    Gravida  2   Para  1   Term  1   Preterm      AB  1   Living  1     SAB      IAB  1   Ectopic      Multiple  0   Live Births  1           Family History  Problem Relation Age of Onset  . Hypertension Mother   . Sickle cell anemia Sister   .  Kidney disease Sister        Lupus  . Arthritis Sister   . Sickle cell anemia Sister   . Sickle cell trait Sister   . Heart disease Maternal Aunt        CABG  . Heart disease Maternal Uncle        CABG  . Lupus Sister     Social History   Tobacco Use  . Smoking status: Current Some Day Smoker    Types: Cigarettes  . Smokeless tobacco: Never Used  Vaping Use  . Vaping Use: Never used  Substance Use Topics  . Alcohol use: No  . Drug use: No    Home Medications Prior to Admission medications   Medication Sig Start Date End Date Taking? Authorizing Provider  ASPIRIN LOW DOSE 81 MG EC tablet Take 81 mg by mouth daily. 08/01/20   [provider]  citalopram (CELEXA) 20 MG tablet TAKE 1 TABLET BY MOUTH EVERY DAY 02/24/21   [name], FNP  cyclobenzaprine (FLEXERIL) 10 MG tablet  TAKE 1 TABLET BY MOUTH TWICE A DAY Patient taking differently: Take 10 mg by mouth daily as needed for muscle spasms. 08/07/20   Vevelyn Francois, NP  folic acid (FOLVITE) 1 MG tablet Take 1 tablet (1 mg total) by mouth daily. 03/15/19   Lanae Boast, FNP  metoprolol succinate (TOPROL-XL) 25 MG 24 hr tablet TAKE 1/2 TABLET BY MOUTH EVERY DAY Patient not taking: Reported on 02/16/2021 12/18/20   Vevelyn Francois, NP  morphine (MS CONTIN) 60 MG 12 hr tablet Take 1 tablet (60 mg total) by mouth every 12 (twelve) hours. 02/19/21 03/21/21  Vevelyn Francois, NP  Oxycodone HCl 10 MG TABS Take 1 tablet (10 mg total) by mouth every 6 (six) hours as needed for up to 15 days. 02/19/21 03/06/21  Vevelyn Francois, NP  Prenatal Vit-Fe Fumarate-FA (PRENATAL VITAMIN PLUS LOW IRON) 27-1 MG TABS TAKE 1 TABLET BY MOUTH EVERY DAY Patient taking differently: Take 1 tablet by mouth daily. 03/03/20   Shelly Bombard, MD  Vitamin D, Ergocalciferol, (DRISDOL) 1.25 MG (50000 UNIT) CAPS capsule TAKE ONE CAPSULE BY MOUTH ONE TIME PER WEEK Patient taking differently: Take 50,000 Units by mouth every 7 (seven) days. 09/03/20   Vevelyn Francois, NP    Allergies    Patient has no known allergies.  Review of Systems   Review of Systems 10 systems reviewed and negative except as per HPI Physical Exam Updated Vital Signs BP 138/89 (BP Location: Left Arm)   Pulse (!) 55   Temp 99.2 F (37.3 C) (Oral)   Resp 17   Ht 5\' 3"  (1.6 m)   Wt 71.7 kg   LMP 02/19/2021   SpO2 98%   BMI 27.99 kg/m   Physical Exam Constitutional:      Appearance: She is well-developed.  HENT:     Head: Normocephalic and atraumatic.  Eyes:     Pupils: Pupils are equal, round, and reactive to light.  Cardiovascular:     Rate and Rhythm: Normal rate and regular rhythm.     Heart sounds: Normal heart sounds.  Pulmonary:     Effort: Pulmonary effort is normal.     Breath sounds: Normal breath sounds.  Abdominal:     General: Bowel sounds are normal.  There is no distension.     Palpations: Abdomen is soft.     Tenderness: There is no abdominal tenderness.  Musculoskeletal:  General: No swelling or tenderness. Normal range of motion.     Cervical back: Neck supple.     Right lower leg: No edema.     Left lower leg: No edema.     Comments: Right upper extremity normal in appearance.  No objective swelling.  Radial pulse 2+ and strong.  Hand is warm and dry.  No effusions of the joints.  Skin:    General: Skin is warm and dry.  Neurological:     Mental Status: She is alert and oriented to person, place, and time.     GCS: GCS eye subscore is 4. GCS verbal subscore is 5. GCS motor subscore is 6.     Coordination: Coordination normal.     ED Results / Procedures / Treatments   Labs (all labs ordered are listed, but only abnormal results are displayed) Labs Reviewed  CBC WITH DIFFERENTIAL/PLATELET - Abnormal; Notable for the following components:      Result Value   RBC 2.95 (*)    Hemoglobin 8.9 (*)    HCT 25.6 (*)    RDW 17.2 (*)    Platelets 462 (*)    nRBC 0.8 (*)    All other components within normal limits  RETICULOCYTES - Abnormal; Notable for the following components:   Retic Ct Pct 7.6 (*)    RBC. 2.91 (*)    Retic Count, Absolute 222.3 (*)    Immature Retic Fract 41.6 (*)    All other components within normal limits  BASIC METABOLIC PANEL - Abnormal; Notable for the following components:   Potassium 3.4 (*)    All other components within normal limits  I-STAT BETA HCG BLOOD, ED ([facility], [facility], [facility] ONLY)    EKG None  Radiology No results found.  Procedures Procedures   Medications Ordered in ED Medications  dextrose 5 %-0.45 % sodium chloride infusion ( Intravenous New Bag/Given 03/02/21 1645)  promethazine (PHENERGAN) tablet 25 mg (25 mg Oral Given 03/02/21 1641)  diphenhydrAMINE (BENADRYL) capsule 25-50 mg (25 mg Oral Given 03/02/21 1641)  ketorolac (TORADOL) 15 MG/ML injection 15 mg (15 mg Intravenous Given  03/02/21 1643)  HYDROmorphone (DILAUDID) injection 2 mg (2 mg Intravenous Given 03/02/21 1644)  HYDROmorphone (DILAUDID) injection 2 mg (2 mg Intravenous Given 03/02/21 1721)  morphine (MSIR) tablet 30 mg (30 mg Oral Given 03/02/21 1755)    ED Course  I have reviewed the triage vital signs and the nursing notes.  Pertinent labs & imaging results that were available during my care of the patient were reviewed by me and considered in my medical decision making (see chart for details).    MDM Rules/Calculators/A&P                         Patient presents reporting pain consistent with her sickle cell pain.  Patient's mental status is clear.  She does not have fever.  No chest pain, no shortness of breath, no vomiting.  Neurovascular examination of the upper extremity is normal.  No trauma history.  No joint effusions or erythema.  No signs of cellulitis.  She has gotten pain control with sickle cell pain regimen of IV Dilaudid, Toradol and oral morphine.  At this time stable for discharge.  Recommendations are for close follow-up and reassessment of the patient's upper extremity.  He requested a sling for comfort.  I have advised she can wear this for comfort but cannot keep it on continuously and must start to  move her upper extremity as she could increase risk for frozen shoulder or other joint stiffness.  If other symptoms develop, she is to return for recheck.  Final Clinical Impression(s) / ED Diagnoses Final diagnoses:  Sickle cell pain crisis [facility])    Rx / DC Orders ED Discharge Orders    None       Charlesetta Shanks, MD 03/02/21 1939

## 2021-03-02 NOTE — ED Triage Notes (Addendum)
Patient c/o sickle cell pain in both arms today. Patient denies any fever or vomiting. Patient also c/o swelling to the right hand.

## 2021-03-02 NOTE — Discharge Instructions (Addendum)
1.  Follow-up with your sickle cell doctor as soon as possible 2.  You may wear the arm sling for comfort as needed.  Do not keep your arm immobilized in the sling for more than 2 days.  You must start to move your arm and do gentle exercises to avoid a frozen shoulder.  See your doctor within 2 days for recheck.

## 2021-03-11 ENCOUNTER — Telehealth (HOSPITAL_COMMUNITY): Payer: Self-pay | Admitting: General Practice

## 2021-03-11 ENCOUNTER — Inpatient Hospital Stay (HOSPITAL_COMMUNITY)
Admission: EM | Admit: 2021-03-11 | Discharge: 2021-03-15 | DRG: 812 | Disposition: A | Discharge status: Home / Self Care | Payer: Medicaid Other | Attending: Internal Medicine | Admitting: Internal Medicine

## 2021-03-11 ENCOUNTER — Encounter (HOSPITAL_COMMUNITY): Payer: Self-pay | Admitting: Emergency Medicine

## 2021-03-11 ENCOUNTER — Other Ambulatory Visit: Payer: Self-pay

## 2021-03-11 ENCOUNTER — Emergency Department (HOSPITAL_COMMUNITY): Payer: Medicaid Other

## 2021-03-11 DIAGNOSIS — G894 Chronic pain syndrome: Secondary | ICD-10-CM | POA: Diagnosis present

## 2021-03-11 DIAGNOSIS — F17210 Nicotine dependence, cigarettes, uncomplicated: Secondary | ICD-10-CM | POA: Diagnosis present

## 2021-03-11 DIAGNOSIS — M25511 Pain in right shoulder: Secondary | ICD-10-CM

## 2021-03-11 DIAGNOSIS — Z8261 Family history of arthritis: Secondary | ICD-10-CM | POA: Diagnosis not present

## 2021-03-11 DIAGNOSIS — F4329 Adjustment disorder with other symptoms: Secondary | ICD-10-CM | POA: Diagnosis present

## 2021-03-11 DIAGNOSIS — D5700 Hb-SS disease with crisis, unspecified: Principal | ICD-10-CM | POA: Diagnosis present

## 2021-03-11 DIAGNOSIS — Z79899 Other long term (current) drug therapy: Secondary | ICD-10-CM

## 2021-03-11 DIAGNOSIS — Z841 Family history of disorders of kidney and ureter: Secondary | ICD-10-CM | POA: Diagnosis not present

## 2021-03-11 DIAGNOSIS — Z832 Family history of diseases of the blood and blood-forming organs and certain disorders involving the immune mechanism: Secondary | ICD-10-CM | POA: Diagnosis not present

## 2021-03-11 DIAGNOSIS — D638 Anemia in other chronic diseases classified elsewhere: Secondary | ICD-10-CM | POA: Diagnosis present

## 2021-03-11 DIAGNOSIS — Z8249 Family history of ischemic heart disease and other diseases of the circulatory system: Secondary | ICD-10-CM

## 2021-03-11 DIAGNOSIS — Z7982 Long term (current) use of aspirin: Secondary | ICD-10-CM | POA: Diagnosis not present

## 2021-03-11 DIAGNOSIS — Z20822 Contact with and (suspected) exposure to covid-19: Secondary | ICD-10-CM | POA: Diagnosis present

## 2021-03-11 DIAGNOSIS — Z79891 Long term (current) use of opiate analgesic: Secondary | ICD-10-CM | POA: Diagnosis not present

## 2021-03-11 LAB — CBC WITH DIFFERENTIAL/PLATELET
Abs Immature Granulocytes: 0.01 10*3/uL (ref 0.00–0.07)
Basophils Absolute: 0 10*3/uL (ref 0.0–0.1)
Basophils Relative: 1 %
Eosinophils Absolute: 0.1 10*3/uL (ref 0.0–0.5)
Eosinophils Relative: 3 %
HCT: 29 % — ABNORMAL LOW (ref 36.0–46.0)
Hemoglobin: 10.2 g/dL — ABNORMAL LOW (ref 12.0–15.0)
Immature Granulocytes: 0 %
Lymphocytes Relative: 56 %
Lymphs Abs: 3.1 10*3/uL (ref 0.7–4.0)
MCH: 30 pg (ref 26.0–34.0)
MCHC: 35.2 g/dL (ref 30.0–36.0)
MCV: 85.3 fL (ref 80.0–100.0)
Monocytes Absolute: 0.6 10*3/uL (ref 0.1–1.0)
Monocytes Relative: 10 %
Neutro Abs: 1.7 10*3/uL (ref 1.7–7.7)
Neutrophils Relative %: 30 %
Platelets: 510 10*3/uL — ABNORMAL HIGH (ref 150–400)
RBC: 3.4 MIL/uL — ABNORMAL LOW (ref 3.87–5.11)
RDW: 14.2 % (ref 11.5–15.5)
WBC: 5.6 10*3/uL (ref 4.0–10.5)
nRBC: 0 % (ref 0.0–0.2)

## 2021-03-11 LAB — COMPREHENSIVE METABOLIC PANEL
ALT: 11 U/L (ref 0–44)
AST: 17 U/L (ref 15–41)
Albumin: 3.7 g/dL (ref 3.5–5.0)
Alkaline Phosphatase: 49 U/L (ref 38–126)
Anion gap: 6 (ref 5–15)
BUN: 10 mg/dL (ref 6–20)
CO2: 26 mmol/L (ref 22–32)
Calcium: 9.1 mg/dL (ref 8.9–10.3)
Chloride: 110 mmol/L (ref 98–111)
Creatinine, Ser: 0.45 mg/dL (ref 0.44–1.00)
GFR, Estimated: >60 mL/min (ref >60)
Glucose, Bld: 104 mg/dL — ABNORMAL HIGH (ref 70–99)
Potassium: 3.6 mmol/L (ref 3.5–5.1)
Sodium: 142 mmol/L (ref 135–145)
Total Bilirubin: 0.8 mg/dL (ref 0.3–1.2)
Total Protein: 6.9 g/dL (ref 6.5–8.1)

## 2021-03-11 LAB — RESP PANEL BY RT-PCR (FLU A&B, COVID) ARPGX2
Influenza A by PCR: NEGATIVE
Influenza B by PCR: NEGATIVE
SARS Coronavirus 2 by RT PCR: NEGATIVE

## 2021-03-11 LAB — RETICULOCYTES
Immature Retic Fract: 19.3 % — ABNORMAL HIGH (ref 2.3–15.9)
RBC.: 3.4 MIL/uL — ABNORMAL LOW (ref 3.87–5.11)
Retic Count, Absolute: 50.3 10*3/uL (ref 19.0–186.0)
Retic Ct Pct: 1.5 % (ref 0.4–3.1)

## 2021-03-11 LAB — BASIC METABOLIC PANEL
Anion gap: 5 (ref 5–15)
BUN: 10 mg/dL (ref 6–20)
CO2: 27 mmol/L (ref 22–32)
Calcium: 8.8 mg/dL — ABNORMAL LOW (ref 8.9–10.3)
Chloride: 107 mmol/L (ref 98–111)
Creatinine, Ser: 0.57 mg/dL (ref 0.44–1.00)
GFR, Estimated: >60 mL/min (ref >60)
Glucose, Bld: 95 mg/dL (ref 70–99)
Potassium: 3.7 mmol/L (ref 3.5–5.1)
Sodium: 139 mmol/L (ref 135–145)

## 2021-03-11 LAB — I-STAT BETA HCG BLOOD, ED (MC, WL, AP ONLY): I-stat hCG, quantitative: <5 m[IU]/mL (ref <5)

## 2021-03-11 MED ORDER — MORPHINE SULFATE ER 15 MG PO TBCR
60.0000 mg | EXTENDED_RELEASE_TABLET | Freq: Two times a day (BID) | ORAL | Status: DC
  Administered 2021-03-11 – 2021-03-15 (×8): 60 mg via ORAL
  Filled 2021-03-11 (×8): qty 4

## 2021-03-11 MED ORDER — HYDROMORPHONE HCL 1 MG/ML IJ SOLN
1.0000 mg | Freq: Once | INTRAMUSCULAR | Status: AC
  Administered 2021-03-11: 1 mg via INTRAVENOUS
  Filled 2021-03-11: qty 1

## 2021-03-11 MED ORDER — NALOXONE HCL 0.4 MG/ML IJ SOLN: 0.4000 mg | INTRAMUSCULAR | Status: DC | PRN

## 2021-03-11 MED ORDER — SODIUM CHLORIDE 0.9 % IV SOLN
25.0000 mg | INTRAVENOUS | Status: DC | PRN
  Filled 2021-03-11: qty 0.5

## 2021-03-11 MED ORDER — DIPHENHYDRAMINE HCL 25 MG PO CAPS
25.0000 mg | ORAL_CAPSULE | ORAL | Status: DC | PRN
  Administered 2021-03-11: 25 mg via ORAL
  Filled 2021-03-11: qty 1

## 2021-03-11 MED ORDER — FOLIC ACID 1 MG PO TABS
1.0000 mg | ORAL_TABLET | Freq: Every day | ORAL | Status: DC
  Administered 2021-03-11 – 2021-03-15 (×5): 1 mg via ORAL
  Filled 2021-03-11 (×5): qty 1

## 2021-03-11 MED ORDER — ONDANSETRON 8 MG PO TBDP: 8.0000 mg | ORAL_TABLET | Freq: Three times a day (TID) | ORAL | Status: DC | PRN

## 2021-03-11 MED ORDER — HYDROMORPHONE HCL 2 MG/ML IJ SOLN
2.0000 mg | INTRAMUSCULAR | Status: AC
  Administered 2021-03-11: 2 mg via INTRAVENOUS
  Filled 2021-03-11: qty 1

## 2021-03-11 MED ORDER — HYDROMORPHONE HCL 2 MG/ML IJ SOLN
2.0000 mg | INTRAMUSCULAR | Status: AC
Start: 2021-03-11 — End: 2021-03-11
  Administered 2021-03-11: 2 mg via INTRAVENOUS
  Filled 2021-03-11: qty 1

## 2021-03-11 MED ORDER — SODIUM CHLORIDE 0.9% FLUSH: 9.0000 mL | INTRAVENOUS | Status: DC | PRN

## 2021-03-11 MED ORDER — ENOXAPARIN SODIUM 40 MG/0.4ML IJ SOSY
40.0000 mg | PREFILLED_SYRINGE | INTRAMUSCULAR | Status: DC
Start: 2021-03-11 — End: 2021-03-15

## 2021-03-11 MED ORDER — KETOROLAC TROMETHAMINE 15 MG/ML IJ SOLN
15.0000 mg | Freq: Four times a day (QID) | INTRAMUSCULAR | Status: DC
  Administered 2021-03-11 – 2021-03-15 (×16): 15 mg via INTRAVENOUS
  Filled 2021-03-11 (×16): qty 1

## 2021-03-11 MED ORDER — DIPHENHYDRAMINE HCL 25 MG PO CAPS: 25.0000 mg | ORAL_CAPSULE | ORAL | Status: DC | PRN

## 2021-03-11 MED ORDER — POLYETHYLENE GLYCOL 3350 17 G PO PACK: 17.0000 g | PACK | Freq: Every day | ORAL | Status: DC | PRN

## 2021-03-11 MED ORDER — KETOROLAC TROMETHAMINE 30 MG/ML IJ SOLN: 30.0000 mg | Freq: Once | INTRAMUSCULAR | Status: DC

## 2021-03-11 MED ORDER — MORPHINE SULFATE 30 MG PO TABS
30.0000 mg | ORAL_TABLET | Freq: Once | ORAL | Status: DC
  Filled 2021-03-11: qty 1

## 2021-03-11 MED ORDER — HYDROMORPHONE HCL 2 MG/ML IJ SOLN: 2.0000 mg | INTRAMUSCULAR | Status: DC

## 2021-03-11 MED ORDER — HYDROMORPHONE HCL 2 MG/ML IJ SOLN
2.0000 mg | INTRAMUSCULAR | Status: DC | PRN
  Administered 2021-03-11: 2 mg via INTRAVENOUS
  Filled 2021-03-11: qty 1

## 2021-03-11 MED ORDER — ONDANSETRON HCL 4 MG/2ML IJ SOLN
4.0000 mg | Freq: Four times a day (QID) | INTRAMUSCULAR | Status: DC | PRN
  Administered 2021-03-15: 4 mg via INTRAVENOUS
  Filled 2021-03-11: qty 2

## 2021-03-11 MED ORDER — METOPROLOL SUCCINATE ER 25 MG PO TB24: 12.5000 mg | ORAL_TABLET | Freq: Every day | ORAL | Status: DC

## 2021-03-11 MED ORDER — CITALOPRAM HYDROBROMIDE 20 MG PO TABS
20.0000 mg | ORAL_TABLET | Freq: Every day | ORAL | Status: DC
  Administered 2021-03-11 – 2021-03-15 (×5): 20 mg via ORAL
  Filled 2021-03-11: qty 1
  Filled 2021-03-11: qty 2
  Filled 2021-03-11 (×3): qty 1

## 2021-03-11 MED ORDER — KETOROLAC TROMETHAMINE 30 MG/ML IJ SOLN
30.0000 mg | Freq: Once | INTRAMUSCULAR | Status: AC
  Administered 2021-03-11: 30 mg via INTRAVENOUS
  Filled 2021-03-11: qty 1

## 2021-03-11 MED ORDER — SENNOSIDES-DOCUSATE SODIUM 8.6-50 MG PO TABS
1.0000 | ORAL_TABLET | Freq: Two times a day (BID) | ORAL | Status: DC
  Administered 2021-03-12 – 2021-03-15 (×7): 1 via ORAL
  Filled 2021-03-11 (×7): qty 1

## 2021-03-11 MED ORDER — HYDROMORPHONE HCL 2 MG/ML IJ SOLN
2.0000 mg | INTRAMUSCULAR | Status: DC
Start: 2021-03-11 — End: 2021-03-11

## 2021-03-11 MED ORDER — HYDROMORPHONE 1 MG/ML IV SOLN
INTRAVENOUS | Status: DC
  Administered 2021-03-11: 30 mg via INTRAVENOUS
  Administered 2021-03-12: 1.5 mg via INTRAVENOUS
  Administered 2021-03-12: 4.5 mg via INTRAVENOUS
  Administered 2021-03-12: 1 mg via INTRAVENOUS
  Filled 2021-03-11 (×2): qty 30

## 2021-03-11 NOTE — H&P (Signed)
H&P  Patient Demographics:  Carmen [name], is a 30 y.o. female  MRN: [id number]   DOB - Apr 02, 1991  Admit Date - 03/11/2021  Outpatient Primary MD for the patient is Vevelyn Francois, NP  Chief Complaint  Patient presents with  . Shoulder Pain  . Sickle Cell Pain Crisis      HPI:   Carmen [name]  is a 30 y.o. female with a medical history significant for sickle cell disease, chronic pain syndrome, opiate dependence and tolerance, history of anemia of chronic disease, and history of generalized anxiety presents with complaints of right shoulder pain that is consistent with previous sickle cell pain crisis.  Patient says that she has been experiencing pain primarily to right shoulder over the past 3 weeks.  She denies any injury.  Patient says that she has been taking home opiate medication consistently without any sustained relief.  She last had MS Contin and oxycodone around 2:00 this morning without any improvement.  Patient attributes current pain crisis to increased stressors at home.  Her sister passed away from sickle cell disease several weeks ago and patient has been grieving.  She currently rates her pain at 10/10 characterized as constant and aching.  Patient is very tearful.  She denies any fever, chills, headache, chest pain, shortness of breath.  No swelling to extremities, decreased range of motion, urinary symptoms, nausea, vomiting, or diarrhea.  Patient denies any sick contacts, recent travel, or exposure to COVID-19 infection.  ER course: Patient's vital signs arewithin normal range.  She is afebrile and oxygen saturation is 98% on RA.  Complete blood count shows hemoglobin of 10.2, otherwise unremarkable.  Complete metabolic panel unremarkable.  Pain persists despite IV Dilaudid, IV fluids, and IV Toradol.  Patient admitted to [facility] for further management of sickle cell pain crisis   Review of systems:  Review of Systems  Constitutional: Negative.   HENT: Negative.   Eyes:  Negative.   Respiratory: Negative.   Cardiovascular: Negative.   Gastrointestinal: Negative.   Genitourinary: Negative.   Musculoskeletal: Positive for back pain and joint pain.  Skin: Negative.   Neurological: Negative.   Psychiatric/Behavioral: Negative.      With Past History of the following :   Past Medical History:  Diagnosis Date  . Anxiety   . Headache(784.0)   . Heart murmur   . Sickle cell crisis ([place])   . Syphilis 2015   Was diagnosed and received one injection of antibiotics  . Thrombocytosis 11/22/2014     CBC Latest Ref Rng & Units 12/13/2018 12/11/2018 12/10/2018 WBC 4.0 - 10.5 K/uL 14.8(H) 13.2(H) 15.9(H) Hemoglobin 12.0 - 15.0 g/dL 8.2(L) 7.7(L) 8.1(L) Hematocrit 36.0 - 46.0 % 23.7(L) 23.2(L) 24.5(L) Platelets 150 - 400 K/uL 326 399 388        Past Surgical History:  Procedure Laterality Date  . CESAREAN SECTION N/A 02/05/2019   Procedure: CESAREAN SECTION;  Surgeon: [name], MD;  Location: [facility];  Service: Obstetrics;  Laterality: N/A;  . CHOLECYSTECTOMY N/A 11/30/2014   Procedure: LAPAROSCOPIC CHOLECYSTECTOMY SINGLE SITE WITH INTRAOPERATIVE CHOLANGIOGRAM;  Surgeon: Michael Boston, MD;  Location: [facility] ORS;  Service: General;  Laterality: N/A;  . SPLENECTOMY       Social History:   Social History   Tobacco Use  . Smoking status: Current Some Day Smoker    Types: Cigarettes  . Smokeless tobacco: Never Used  Substance Use Topics  . Alcohol use: No     Lives - At home  Family History :   Family History  Problem Relation Age of Onset  . Hypertension Mother   . Sickle cell anemia Sister   . Kidney disease Sister        Lupus  . Arthritis Sister   . Sickle cell anemia Sister   . Sickle cell trait Sister   . Heart disease Maternal Aunt        CABG  . Heart disease Maternal Uncle        CABG  . Lupus Sister      Home Medications:   Prior to Admission medications   Medication Sig Start Date End Date Taking? Authorizing Provider  ASPIRIN  LOW DOSE 81 MG EC tablet Take 81 mg by mouth daily. 08/01/20   [provider]  citalopram (CELEXA) 20 MG tablet TAKE 1 TABLET BY MOUTH EVERY DAY 02/24/21   [name], FNP  cyclobenzaprine (FLEXERIL) 10 MG tablet TAKE 1 TABLET BY MOUTH TWICE A DAY Patient taking differently: Take 10 mg by mouth daily as needed for muscle spasms. 08/07/20   Vevelyn Francois, NP  folic acid (FOLVITE) 1 MG tablet Take 1 tablet (1 mg total) by mouth daily. 03/15/19   Lanae Boast, FNP  metoprolol succinate (TOPROL-XL) 25 MG 24 hr tablet TAKE 1/2 TABLET BY MOUTH EVERY DAY Patient not taking: Reported on 02/16/2021 12/18/20   Vevelyn Francois, NP  morphine (MS CONTIN) 60 MG 12 hr tablet Take 1 tablet (60 mg total) by mouth every 12 (twelve) hours. 02/19/21 03/21/21  Vevelyn Francois, NP  Oxycodone HCl 10 MG TABS Take 1 tablet (10 mg total) by mouth every 6 (six) hours as needed for up to 15 days. 02/19/21 03/06/21  Vevelyn Francois, NP  Prenatal Vit-Fe Fumarate-FA (PRENATAL VITAMIN PLUS LOW IRON) 27-1 MG TABS TAKE 1 TABLET BY MOUTH EVERY DAY Patient taking differently: Take 1 tablet by mouth daily. 03/03/20   Shelly Bombard, MD  Vitamin D, Ergocalciferol, (DRISDOL) 1.25 MG (50000 UNIT) CAPS capsule TAKE ONE CAPSULE BY MOUTH ONE TIME PER WEEK Patient taking differently: Take 50,000 Units by mouth every 7 (seven) days. 09/03/20   Vevelyn Francois, NP     Allergies:   No Known Allergies   Physical Exam:   Vitals:   Vitals:   03/11/21 1415 03/11/21 1430  BP:  132/88  Pulse: (!) 58 66  Resp:  18  Temp:    SpO2: 99% 99%    Physical Exam: Constitutional: Patient appears well-developed and well-nourished. Not in obvious distress. HENT: Normocephalic, atraumatic, External right and left ear normal. Oropharynx is clear and moist.  Eyes: Conjunctivae and EOM are normal. PERRLA, no scleral icterus. Neck: Normal ROM. Neck supple. No JVD. No tracheal deviation. No thyromegaly. CVS: RRR, S1/S2 +, no murmurs, no  gallops, no carotid bruit.  Pulmonary: Effort and breath sounds normal, no stridor, rhonchi, wheezes, rales.  Abdominal: Soft. BS +, no distension, tenderness, rebound or guarding.  Musculoskeletal: Normal range of motion. No edema and no tenderness.  Lymphadenopathy: No lymphadenopathy noted, cervical, inguinal or axillary Neuro: Alert. Normal reflexes, muscle tone coordination. No cranial nerve deficit. Skin: Skin is warm and dry. No rash noted. Not diaphoretic. No erythema. No pallor. Psychiatric: Normal mood and affect. Behavior, judgment, thought content normal.   Data Review:   CBC Recent Labs  Lab 03/11/21 1238  WBC 5.6  HGB 10.2*  HCT 29.0*  PLT 510*  MCV 85.3  MCH 30.0  MCHC 35.2  RDW  14.2  LYMPHSABS 3.1  MONOABS 0.6  EOSABS 0.1  BASOSABS 0.0   ------------------------------------------------------------------------------------------------------------------  Chemistries  Recent Labs  Lab 03/11/21 1238  NA 142  K 3.6  CL 110  CO2 26  GLUCOSE 104*  BUN 10  CREATININE 0.45  CALCIUM 9.1  AST 17  ALT 11  ALKPHOS 49  BILITOT 0.8   ------------------------------------------------------------------------------------------------------------------ estimated creatinine clearance is 99.5 mL/min (by C-G formula based on SCr of 0.45 mg/dL). ------------------------------------------------------------------------------------------------------------------ No results for input(s): TSH, T4TOTAL, T3FREE, THYROIDAB in the last 72 hours.  Invalid input(s): FREET3  Coagulation profile No results for input(s): INR, PROTIME in the last 168 hours. ------------------------------------------------------------------------------------------------------------------- No results for input(s): DDIMER in the last 72 hours. -------------------------------------------------------------------------------------------------------------------  Cardiac Enzymes No results for input(s):  CKMB, TROPONINI, MYOGLOBIN in the last 168 hours.  Invalid input(s): CK ------------------------------------------------------------------------------------------------------------------ No results found for: BNP  ---------------------------------------------------------------------------------------------------------------  Urinalysis    Component Value Date/Time   COLORURINE YELLOW 01/18/2021 0442   APPEARANCEUR HAZY (A) 01/18/2021 0442   LABSPEC 1.013 01/18/2021 0442   PHURINE 5.0 01/18/2021 0442   GLUCOSEU NEGATIVE 01/18/2021 0442   HGBUR NEGATIVE 01/18/2021 0442   BILIRUBINUR NEGATIVE 01/18/2021 0442   BILIRUBINUR Negative 08/10/2019 1534   KETONESUR NEGATIVE 01/18/2021 0442   PROTEINUR NEGATIVE 01/18/2021 0442   UROBILINOGEN 1.0 08/10/2019 1534   UROBILINOGEN 1.0 01/12/2018 0943   NITRITE NEGATIVE 01/18/2021 0442   LEUKOCYTESUR NEGATIVE 01/18/2021 0442    ----------------------------------------------------------------------------------------------------------------   Imaging Results:    DG Shoulder Right  Result Date: 03/11/2021 CLINICAL DATA:  Right upper arm and back pain.  Sickle cell crisis. EXAM: RIGHT SHOULDER - 2+ VIEW COMPARISON:  None. FINDINGS: There is no evidence of fracture or dislocation. There is no evidence of arthropathy or other focal bone abnormality. Soft tissues are unremarkable. IMPRESSION: No acute osseous injury of the right shoulder. Electronically Signed   By: Kathreen Devoid   On: 03/11/2021 13:25     Assessment & Plan:  Principal Problem:   Sickle cell pain crisis ([place]) Active Problems:   Chronic prescription opiate use  Sickle cell disease with pain crisis: Admit to MedSurg.  Initiate IV Dilaudid PCA with settings of 0.5 mg, 10-minute lockout, and 3 mg/h Toradol 15 mg IV every 6 hours for total of 5 days IV fluids, 0.45% saline at 100 mL/h Monitor vital signs very closely.  Reevaluate pain scale regularly and supplemental oxygen as  needed. Patient will be reevaluated for a period in context of function and relationship to baseline as her care progresses  Chronic pain syndrome: Continue home medications  Sickle cell anemia: Hemoglobin 10.2, consistent with patient's baseline no further interventions warranted at this time.  We will continue to follow closely.  DVT Prophylaxis: Subcut Lovenox   AM Labs Ordered, also please review Full Orders  Family Communication: Admission, patient's condition and plan of care including tests being ordered have been discussed with the patient who indicate understanding and agree with the plan and Code Status.  Code Status: Full Code  Consults called: None    Admission status: Inpatient    Time spent in minutes : 35 minutes  [place], MSN, FNP-C Patient [facility] [address] [place], [place] [address] [phone]  03/11/2021 at 2:51 PM

## 2021-03-11 NOTE — ED Notes (Signed)
Attempted [place] IV x2, unsuccessful. Another RN to attempt.

## 2021-03-11 NOTE — ED Triage Notes (Signed)
Patient was seen 5/2 for R upper arm/ back pain and sickle cell crisis, stated her pain has not gotten any better but worse over the past week or so. Denies SOB, fevers, cough or trauma/falls to this arm.

## 2021-03-11 NOTE — Telephone Encounter (Signed)
Patient called, requesting to come to the day hospital due to pain in the arms and back rated at 10/10. Per provider, the [facility] does not have the capacity to admit the patient today. Patient told to continue to take pain medications as prescribed, stay hydrated but if the pain is unbearable today, can go to the ER. Alternatively, call the [name] in the morning. Patient  verbalized understanding.

## 2021-03-11 NOTE — ED Provider Notes (Signed)
[facility] DEPT Provider Note   CSN: [id number] Arrival date & time: 03/11/21  1035     History Chief Complaint  Patient presents with  . Shoulder Pain  . Sickle Cell Pain Crisis    Carmen [name] is a 30 y.o. female.  HPI Patient is a 30 year old female with a past medical history significant for anxiety, sickle cell disease, acute chest syndrome  Patient is presented today with approximately 3-4 weeks of continued right shoulder/right upper back pain that has been achy, severe, 10/10 and worsening over the past week.  She states that she has no cough, fevers, chills, shortness of breath and no trauma to her arm or shoulder or back.  She denies any numbness or weakness in her arm.  She states this feels like a sickle cell pain crisis she states that this area is prone to having pain.  She has not had an x-ray done within the past couple years to evaluate for avascular necrosis.  She has no improvement with her home medications which she has been taking religiously.  She has a follow-up appointment with her sickle cell disease specialist in 3 weeks.  She denies any nausea vomiting or diarrhea.  No urinary dysuria frequency urgency or hematuria. Denies any abdominal pain.  No other significant associated symptoms.  She attributes the flareup to stress and weather changes.  She states that she was seen 5/2.  I reviewed discharge she was given pain medication and improved enough to go home.  She states that she continued to worsen when she was discharged.     Past Medical History:  Diagnosis Date  . Anxiety   . Headache(784.0)   . Heart murmur   . Sickle cell crisis ([place])   . Syphilis 2015   Was diagnosed and received one injection of antibiotics  . Thrombocytosis 11/22/2014     CBC Latest Ref Rng & Units 12/13/2018 12/11/2018 12/10/2018 WBC 4.0 - 10.5 K/uL 14.8(H) 13.2(H) 15.9(H) Hemoglobin 12.0 - 15.0 g/dL 8.2(L) 7.7(L) 8.1(L) Hematocrit 36.0 -  46.0 % 23.7(L) 23.2(L) 24.5(L) Platelets 150 - 400 K/uL 326 399 388      Patient Active Problem List   Diagnosis Date Noted  . Sickle cell anemia with crisis ([place]) 02/16/2021  . Acute chest syndrome ([place]) 01/18/2021  . Community acquired pneumonia of right lower lobe of lung   . Fever of unknown origin 10/14/2020  . Sickle cell crisis ([place]) 03/31/2020  . Bacterial vaginitis 01/04/2020  . Acute cystitis without hematuria   . Dysuria 12/31/2019  . Urine leukocytes   . Sickle cell pain crisis ([place]) 09/10/2019  . Sickle cell disease ([place]) 12/04/2018  . Alpha thalassemia silent carrier 12/04/2018  . Chronic prescription opiate use 03/13/2018  . Chronic musculoskeletal pain 03/13/2018  . Vitamin D deficiency 01/13/2018  . Leukocytosis 08/21/2017  . Hypokalemia 06/27/2017  . 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[place]) 04/05/2017  . Hb-SS disease without crisis ([place]) 08/15/2016  . Anemia of chronic disease   . Chest pain 11/10/2015  . Systolic ejection murmur 75/08/2584  . Sickle cell anemia of mother during pregnancy [facility]) 05/16/2014    Past Surgical History:  Procedure Laterality Date  . CESAREAN SECTION N/A 02/05/2019   Procedure: CESAREAN SECTION;  Surgeon: [name], MD;  Location: [facility] LD [facility]S;  Service: Obstetrics;  Laterality: N/A;  . CHOLECYSTECTOMY N/A 11/30/2014   Procedure: LAPAROSCOPIC CHOLECYSTECTOMY SINGLE SITE WITH INTRAOPERATIVE CHOLANGIOGRAM;  Surgeon: Michael Boston, MD;  Location: [facility]  ORS;  Service: General;  Laterality: N/A;  . SPLENECTOMY       OB History    Gravida  2   Para  1   Term  1   Preterm      AB  1   Living  1     SAB      IAB  1   Ectopic      Multiple  0   Live Births  1           Family History  Problem Relation Age of Onset  . Hypertension Mother   . Sickle cell anemia Sister   . Kidney disease Sister        Lupus  . Arthritis Sister   . Sickle cell anemia Sister   . Sickle cell trait Sister   . Heart disease  Maternal Aunt        CABG  . Heart disease Maternal Uncle        CABG  . Lupus Sister     Social History   Tobacco Use  . Smoking status: Current Some Day Smoker    Types: Cigarettes  . Smokeless tobacco: Never Used  Vaping Use  . Vaping Use: Never used  Substance Use Topics  . Alcohol use: No  . Drug use: No    Home Medications Prior to Admission medications   Medication Sig Start Date End Date Taking? Authorizing Provider  ASPIRIN LOW DOSE 81 MG EC tablet Take 81 mg by mouth daily. 08/01/20   [provider]  citalopram (CELEXA) 20 MG tablet TAKE 1 TABLET BY MOUTH EVERY DAY 02/24/21   [name], FNP  cyclobenzaprine (FLEXERIL) 10 MG tablet TAKE 1 TABLET BY MOUTH TWICE A DAY Patient taking differently: Take 10 mg by mouth daily as needed for muscle spasms. 08/07/20   Vevelyn Francois, NP  folic acid (FOLVITE) 1 MG tablet Take 1 tablet (1 mg total) by mouth daily. 03/15/19   Lanae Boast, FNP  metoprolol succinate (TOPROL-XL) 25 MG 24 hr tablet TAKE 1/2 TABLET BY MOUTH EVERY DAY Patient not taking: Reported on 02/16/2021 12/18/20   Vevelyn Francois, NP  morphine (MS CONTIN) 60 MG 12 hr tablet Take 1 tablet (60 mg total) by mouth every 12 (twelve) hours. 02/19/21 03/21/21  Vevelyn Francois, NP  Oxycodone HCl 10 MG TABS Take 1 tablet (10 mg total) by mouth every 6 (six) hours as needed for up to 15 days. 02/19/21 03/06/21  Vevelyn Francois, NP  Prenatal Vit-Fe Fumarate-FA (PRENATAL VITAMIN PLUS LOW IRON) 27-1 MG TABS TAKE 1 TABLET BY MOUTH EVERY DAY Patient taking differently: Take 1 tablet by mouth daily. 03/03/20   Shelly Bombard, MD  Vitamin D, Ergocalciferol, (DRISDOL) 1.25 MG (50000 UNIT) CAPS capsule TAKE ONE CAPSULE BY MOUTH ONE TIME PER WEEK Patient taking differently: Take 50,000 Units by mouth every 7 (seven) days. 09/03/20   Vevelyn Francois, NP    Allergies    Patient has no known allergies.  Review of Systems   Review of Systems  Constitutional: Negative for  fever.  HENT: Negative for congestion.   Eyes: Negative for pain.  Respiratory: Negative for shortness of breath.   Cardiovascular: Negative for chest pain.  Gastrointestinal: Negative for abdominal pain.  Genitourinary: Negative for dysuria.  Musculoskeletal: Negative for myalgias.       Right shoulder pain  Skin: Negative for rash.  Neurological: Negative for headaches.    Physical Exam Updated  Vital Signs BP 132/88   Pulse 66   Temp 98.8 F (37.1 C) (Oral)   Resp 18   Ht 5\' 3"  (1.6 m)   Wt 72 kg   LMP 02/19/2021   SpO2 99%   BMI 28.12 kg/m   Physical Exam Vitals and nursing note reviewed.  Constitutional:      General: She is not in acute distress.    Comments: Initial assessment pt appears uncomfortable but in no acute distress  HENT:     Head: Normocephalic and atraumatic.     Nose: Nose normal.     Mouth/Throat:     Mouth: Mucous membranes are moist.  Eyes:     General: No scleral icterus. Cardiovascular:     Rate and Rhythm: Normal rate and regular rhythm.     Pulses: Normal pulses.     Heart sounds: Normal heart sounds.  Pulmonary:     Effort: Pulmonary effort is normal. No respiratory distress.     Breath sounds: Normal breath sounds. No wheezing.  Abdominal:     Palpations: Abdomen is soft.     Tenderness: There is no abdominal tenderness. There is no guarding or rebound.  Musculoskeletal:     Cervical back: Normal range of motion.     Right lower leg: No edema.     Left lower leg: No edema.     Comments: Diffuse TTP of right shoulder and right scapula  FROM of shoulder and elbow/wrist  Skin:    General: Skin is warm and dry.     Capillary Refill: Capillary refill takes less than 2 seconds.  Neurological:     Mental Status: She is alert. Mental status is at baseline.  Psychiatric:        Mood and Affect: Mood normal.        Behavior: Behavior normal.     ED Results / Procedures / Treatments   Labs (all labs ordered are listed, but only  abnormal results are displayed) Labs Reviewed  CBC WITH DIFFERENTIAL/PLATELET - Abnormal; Notable for the following components:      Result Value   RBC 3.40 (*)    Hemoglobin 10.2 (*)    HCT 29.0 (*)    Platelets 510 (*)    All other components within normal limits  RETICULOCYTES - Abnormal; Notable for the following components:   RBC. 3.40 (*)    Immature Retic Fract 19.3 (*)    All other components within normal limits  COMPREHENSIVE METABOLIC PANEL - Abnormal; Notable for the following components:   Glucose, Bld 104 (*)    All other components within normal limits  RESP PANEL BY RT-PCR (FLU A&B, COVID) ARPGX2  URINALYSIS, ROUTINE W REFLEX MICROSCOPIC  I-STAT BETA HCG BLOOD, ED ([facility], [facility], [facility] ONLY)    EKG None  Radiology DG Shoulder Right  Result Date: 03/11/2021 CLINICAL DATA:  Right upper arm and back pain.  Sickle cell crisis. EXAM: RIGHT SHOULDER - 2+ VIEW COMPARISON:  None. FINDINGS: There is no evidence of fracture or dislocation. There is no evidence of arthropathy or other focal bone abnormality. Soft tissues are unremarkable. IMPRESSION: No acute osseous injury of the right shoulder. Electronically Signed   By: Kathreen Devoid   On: 03/11/2021 13:25    Procedures Procedures   Medications Ordered in ED Medications  diphenhydrAMINE (BENADRYL) capsule 25-50 mg (25 mg Oral Given 03/11/21 1237)  HYDROmorphone (DILAUDID) injection 2 mg (2 mg Intravenous Given 03/11/21 1236)  HYDROmorphone (DILAUDID) injection 2 mg (2  mg Intravenous Given 03/11/21 1308)  ketorolac (TORADOL) 30 MG/ML injection 30 mg (30 mg Intravenous Given 03/11/21 1235)  HYDROmorphone (DILAUDID) injection 1 mg (1 mg Intravenous Given 03/11/21 1406)    ED Course  I have reviewed the triage vital signs and the nursing notes.  Pertinent labs & imaging results that were available during my care of the patient were reviewed by me and considered in my medical decision making (see chart for details).  Patient is a  30 year old sickle cell disease patient presented today with severe right shoulder, right upper back and right scapular pain.  Physical exam has unremarkable apart from some diffuse tenderness to palpation of right shoulder right scapula but does have good distal pulses good cap refill sensation and movement.  Will obtain x-ray to evaluate for avascular necrosis.  We will obtain basic lab work and provide analgesia.  Clinical Course as of 03/11/21 1437  Wed Mar 11, 2021  1330 No evidence of avascular necrosis of the proximal humerus/scapula on plain film. [WF]  1404 Discussed with [place] of sickle cell clinic.  Will admit for overnight observation. [WF]  1404 CBC without leukocytosis anemia which is ongoing actually improved from baseline.  CMP unremarkable.  Reticulocyte count elevated, less elevated than 9 days ago no evidence of aplastic anemia.  I-STAT ECG negative for pregnancy.  COVID test pending at this time. [WF]    Clinical Course User Index [WF] Tedd Sias, [place]   MDM Rules/Calculators/A&P                          Patient states her pain has not improved at all in fact has worsened.  On reassessment patient is resting admission.  I had lengthy discussion with her.  She would prefer to be admitted then discharged home with return precautions.  Final Clinical Impression(s) / ED Diagnoses Final diagnoses:  Sickle cell pain crisis ([place])  Acute pain of right shoulder    Rx / DC Orders ED Discharge Orders    None       Tedd Sias, [place] 03/11/21 1439    Charlesetta Shanks, MD 03/20/21 [phone]

## 2021-03-12 LAB — CBC
HCT: 28 % — ABNORMAL LOW (ref 36.0–46.0)
Hemoglobin: 9.8 g/dL — ABNORMAL LOW (ref 12.0–15.0)
MCH: 30.4 pg (ref 26.0–34.0)
MCHC: 35 g/dL (ref 30.0–36.0)
MCV: 87 fL (ref 80.0–100.0)
Platelets: 427 10*3/uL — ABNORMAL HIGH (ref 150–400)
RBC: 3.22 MIL/uL — ABNORMAL LOW (ref 3.87–5.11)
RDW: 14.4 % (ref 11.5–15.5)
WBC: 7.5 10*3/uL (ref 4.0–10.5)
nRBC: 0 % (ref 0.0–0.2)

## 2021-03-12 MED ORDER — HYDROMORPHONE 1 MG/ML IV SOLN: INTRAVENOUS | Status: DC

## 2021-03-12 MED ORDER — HYDROMORPHONE 1 MG/ML IV SOLN
INTRAVENOUS | Status: DC
Start: 2021-03-12 — End: 2021-03-15
  Administered 2021-03-12: 1 mg via INTRAVENOUS
  Administered 2021-03-12: 30 mg via INTRAVENOUS
  Administered 2021-03-12: 4.5 mg via INTRAVENOUS
  Administered 2021-03-12: 1 mg via INTRAVENOUS
  Administered 2021-03-13: 2.5 mg via INTRAVENOUS
  Administered 2021-03-13 (×2): 4 mg via INTRAVENOUS
  Administered 2021-03-13: 5.5 mg via INTRAVENOUS
  Administered 2021-03-13: 4 mg via INTRAVENOUS
  Administered 2021-03-14 (×2): 5.5 mg via INTRAVENOUS
  Administered 2021-03-14: 5 mg via INTRAVENOUS
  Administered 2021-03-14: 30 mg via INTRAVENOUS
  Administered 2021-03-14: 9.5 mg via INTRAVENOUS
  Administered 2021-03-14: 1 mg via INTRAVENOUS
  Administered 2021-03-14: 6.5 mg via INTRAVENOUS
  Administered 2021-03-15: 30 mg via INTRAVENOUS
  Administered 2021-03-15: 7.5 mg via INTRAVENOUS
  Administered 2021-03-15: 6.5 mg via INTRAVENOUS
  Filled 2021-03-12 (×3): qty 30

## 2021-03-12 MED ORDER — OXYCODONE HCL 5 MG PO TABS
10.0000 mg | ORAL_TABLET | ORAL | Status: DC | PRN
  Administered 2021-03-12 – 2021-03-15 (×6): 10 mg via ORAL
  Filled 2021-03-12 (×6): qty 2

## 2021-03-12 NOTE — Plan of Care (Signed)
Pt deeply asleep through the night; respiration went down to 5, pt easily awaken; AAOx4 when awaken, respiration go back to 14. No s/s of acute distress or uncontrolled pain reported or observed. Call light within reach and bed at lowest level for safety. Pt able to ambulate without assistance.

## 2021-03-12 NOTE — Progress Notes (Signed)
Subjective: Carmen [name] is a 30 year old female with a medical history significant for sickle cell disease, chronic pain syndrome, opiate dependence and tolerance, and history of anemia of chronic disease was admitted for sickle cell pain crisis. Patient says that pain has not improved overnight.  She rates pain as 9/10 primarily to right shoulder and upper back.  Patient underwent right shoulder x-ray on yesterday which had no significant findings. Patient denies headache, chest pain, dizziness, urinary symptoms, nausea, vomiting, or diarrhea.  Objective:  Vital signs in last 24 hours:  Vitals:   03/12/21 0720 03/12/21 0955 03/12/21 1132 03/12/21 1323  BP:  (!) 148/87  (!) 145/97  Pulse:  62  61  Resp: 17 15 14 14   Temp:  98.3 F (36.8 C)  98.1 F (36.7 C)  TempSrc:  Oral  Oral  SpO2: 100% 100% 99% 100%  Weight:      Height:        Intake/Output from previous day:  No intake or output data in the 24 hours ending 03/12/21 1550  Physical Exam: General: Alert, awake, oriented x3, in no acute distress.  HEENT: [place]/AT PEERL, EOMI Neck: Trachea midline,  no masses, no thyromegal,y no JVD, no carotid bruit OROPHARYNX:  Moist, No exudate/ erythema/lesions.  Heart: Regular rate and rhythm, without murmurs, rubs, gallops, PMI non-displaced, no heaves or thrills on palpation.  Lungs: Clear to auscultation, no wheezing or rhonchi noted. No increased vocal fremitus resonant to percussion  Abdomen: Soft, nontender, nondistended, positive bowel sounds, no masses no hepatosplenomegaly noted..  Neuro: No focal neurological deficits noted cranial nerves II through XII grossly intact. DTRs 2+ bilaterally upper and lower extremities. Strength 5 out of 5 in bilateral upper and lower extremities. Musculoskeletal: No warm swelling or erythema around joints, no spinal tenderness noted. Psychiatric: Patient alert and oriented x3, good insight and cognition, good recent to remote recall. Lymph node  survey: No cervical axillary or inguinal lymphadenopathy noted.  Lab Results:  Basic Metabolic Panel:    Component Value Date/Time   NA 139 03/11/2021 1635   NA 142 07/02/2020 1040   K 3.7 03/11/2021 1635   CL 107 03/11/2021 1635   CO2 27 03/11/2021 1635   BUN 10 03/11/2021 1635   BUN 8 07/02/2020 1040   CREATININE 0.57 03/11/2021 1635   GLUCOSE 95 03/11/2021 1635   CALCIUM 8.8 (L) 03/11/2021 1635   CBC:    Component Value Date/Time   WBC 7.5 03/12/2021 0521   HGB 9.8 (L) 03/12/2021 0521   HGB 9.8 (L) 07/02/2020 1040   HCT 28.0 (L) 03/12/2021 0521   HCT 28.9 (L) 07/02/2020 1040   PLT 427 (H) 03/12/2021 0521   PLT 628 (H) 07/02/2020 1040   MCV 87.0 03/12/2021 0521   MCV 88 07/02/2020 1040   NEUTROABS 1.7 03/11/2021 1238   NEUTROABS 3.0 07/02/2020 1040   LYMPHSABS 3.1 03/11/2021 1238   LYMPHSABS 3.5 (H) 07/02/2020 1040   MONOABS 0.6 03/11/2021 1238   EOSABS 0.1 03/11/2021 1238   EOSABS 0.2 07/02/2020 1040   BASOSABS 0.0 03/11/2021 1238   BASOSABS 0.1 07/02/2020 1040    Recent Results (from the past 240 hour(s))  Resp Panel by RT-PCR (Flu A&B, Covid) Nasopharyngeal Swab     Status: None   Collection Time: 03/11/21  1:54 PM   Specimen: Nasopharyngeal Swab; Nasopharyngeal(NP) swabs in vial transport medium  Result Value Ref Range Status   SARS Coronavirus 2 by RT PCR NEGATIVE NEGATIVE Final    Comment: (NOTE) SARS-CoV-2  target nucleic acids are NOT DETECTED.  The SARS-CoV-2 RNA is generally detectable in upper respiratory specimens during the acute phase of infection. The lowest concentration of SARS-CoV-2 viral copies this assay can detect is 138 copies/mL. A negative result does not preclude SARS-Cov-2 infection and should not be used as the sole basis for treatment or other patient management decisions. A negative result may occur with  improper specimen collection/handling, submission of specimen other than nasopharyngeal swab, presence of viral mutation(s)  within the areas targeted by this assay, and inadequate number of viral copies(<138 copies/mL). A negative result must be combined with clinical observations, patient history, and epidemiological information. The expected result is Negative.  Fact Sheet for Patients:  EntrepreneurPulse.com.au  Fact Sheet for Healthcare Providers:  IncredibleEmployment.be  This test is no t yet approved or cleared by the [place] FDA and  has been authorized for detection and/or diagnosis of SARS-CoV-2 by FDA under an Emergency Use Authorization (EUA). This EUA will remain  in effect (meaning this test can be used) for the duration of the COVID-19 declaration under Section 564(b)(1) of the Act, 21 U.S.C.section 360bbb-3(b)(1), unless the authorization is terminated  or revoked sooner.       Influenza A by PCR NEGATIVE NEGATIVE Final   Influenza B by PCR NEGATIVE NEGATIVE Final    Comment: (NOTE) The Xpert Xpress SARS-CoV-2/FLU/RSV plus assay is intended as an aid in the diagnosis of influenza from Nasopharyngeal swab specimens and should not be used as a sole basis for treatment. Nasal washings and aspirates are unacceptable for Xpert Xpress SARS-CoV-2/FLU/RSV testing.  Fact Sheet for Patients: EntrepreneurPulse.com.au  Fact Sheet for Healthcare Providers: IncredibleEmployment.be  This test is not yet approved or cleared by the [place] FDA and has been authorized for detection and/or diagnosis of SARS-CoV-2 by FDA under an Emergency Use Authorization (EUA). This EUA will remain in effect (meaning this test can be used) for the duration of the COVID-19 declaration under Section 564(b)(1) of the Act, 21 U.S.C. section 360bbb-3(b)(1), unless the authorization is terminated or revoked.  Performed at [facility], [place] [address], [place], [place] [address]     Studies/Results: DG Shoulder  Right  Result Date: 03/11/2021 CLINICAL DATA:  Right upper arm and back pain.  Sickle cell crisis. EXAM: RIGHT SHOULDER - 2+ VIEW COMPARISON:  None. FINDINGS: There is no evidence of fracture or dislocation. There is no evidence of arthropathy or other focal bone abnormality. Soft tissues are unremarkable. IMPRESSION: No acute osseous injury of the right shoulder. Electronically Signed   By: Kathreen Devoid   On: 03/11/2021 13:25    Medications: Scheduled Meds: . citalopram  20 mg Oral Daily  . enoxaparin (LOVENOX) injection  40 mg Subcutaneous Q24H  . folic acid  1 mg Oral Daily  . HYDROmorphone   Intravenous Q4H  . ketorolac  15 mg Intravenous Q6H  . morphine  60 mg Oral Q12H  . senna-docusate  1 tablet Oral BID   Continuous Infusions: . diphenhydrAMINE     PRN Meds:.diphenhydrAMINE **OR** diphenhydrAMINE, naloxone **AND** sodium chloride flush, ondansetron (ZOFRAN) IV, oxyCODONE, polyethylene glycol  Consultants:  None  Procedures:  None  Antibiotics:  None  Assessment/Plan: Principal Problem:   Sickle cell pain crisis (HCC) Active Problems:   Chronic prescription opiate use  Sickle cell disease with pain crisis: Reduce IV fluids to [facility] Weaning IV Dilaudid PCA, settings decreased today Restart home medications, oxycodone 10 mg every 4 hours as needed for severe breakthrough  pain Toradol 15 mg IV every 6 hours Monitor vital signs very closely, reevaluate pain scale regularly, and supplemental oxygen as needed.  Sickle cell anemia: Hemoglobin continues to be consistent with patient's baseline.  There is no clinical indication for blood transfusion at this time.  Continue to follow closely.  Chronic pain syndrome: Continue home medications    Code Status: Full Code Family Communication: N/A Disposition Plan: Not yet ready for discharge  [place], MSN, FNP-C Patient [place], [place]  [address] [phone]   If 5PM-7AM, please contact night-coverage.  03/12/2021, 3:50 PM  LOS: 1 day

## 2021-03-12 NOTE — Progress Notes (Signed)
Chaplain engaged in an initial visit with Carmen [name].  Chaplain had the opportunity to meet Hunter's sister on a separate occasion, who also has sickle cell, and was also going through the same grief that Carmen [name] is experiencing right now in the death of their other sister.  Chaplain offered support to Carmen [name] as she shared about what her grieving has looked like daily.  Carmen [name] that she has been taking it day-by-day and that some days are good and some days are bad.  She stated that she experiences new emotions everyday and that sometimes she is really in tune with her emotions and sometimes she isn't.  Carmen [name] also vocalized that she knows when she needs to spend time by herself so that her emotions don't create barriers with others because of what she may say or do.  She knows when she needs alone time.  Chaplain celebrated and affirmed her self-awareness.  Chaplain also worked to understand the support system Carmen [name] has.  She lives with her mom and leans on her family.  She also has support from her family in raising her young son.  Carmen [name] voiced that they will get through this time.  She seems to have a good balance of spending time with herself and family. Carmen [name] also has a Social worker that she utilizes.  Chaplain encouraged Carmen [name] not to "suffer in silence" and to utilize writing in her journal, which she uses often, as a tool as well to process her emotions.    Chaplain offered presence, listening, and prayer with Carmen [name].  Chaplain is available to follow-up as needed.    03/12/21 1200  Clinical Encounter Type  Visited With Patient  Visit Type Initial;Spiritual support  Spiritual Encounters  Spiritual Needs Grief support;Prayer  Stress Factors  Patient Stress Factors Major life changes;Health changes

## 2021-03-13 DIAGNOSIS — D5700 Hb-SS disease with crisis, unspecified: Secondary | ICD-10-CM | POA: Diagnosis not present

## 2021-03-13 NOTE — Progress Notes (Signed)
Subjective: Carmen [name] is a 30 year old female with a medical history significant for sickle cell disease, chronic pain syndrome, opiate dependence and tolerance, and history of anemia of chronic disease was admitted for sickle cell pain crisis. Patient has no new complaints on today.  She continues to complain of pain primarily to right shoulder, upper back, and lower extremities.  Pain intensity is 8/10.  She denies any headache, chest pain, shortness of breath, urinary symptoms, nausea, vomiting, or diarrhea.  Objective:  Vital signs in last 24 hours:  Vitals:   03/13/21 0804 03/13/21 1000 03/13/21 1253 03/13/21 1344  BP:  (!) 134/94  115/65  Pulse:  60  61  Resp: 16 16 15 16   Temp:  98.7 F (37.1 C)    TempSrc:  Oral    SpO2: 96% 99% 96% 95%  Weight:      Height:        Intake/Output from previous day:   Intake/Output Summary (Last 24 hours) at 03/13/2021 1508 Last data filed at 03/13/2021 1433 Gross per 24 hour  Intake 600 ml  Output 1400 ml  Net -800 ml    Physical Exam: General: Alert, awake, oriented x3, in no acute distress.  HEENT: [place]/AT PEERL, EOMI Neck: Trachea midline,  no masses, no thyromegal,y no JVD, no carotid bruit OROPHARYNX:  Moist, No exudate/ erythema/lesions.  Heart: Regular rate and rhythm, without murmurs, rubs, gallops, PMI non-displaced, no heaves or thrills on palpation.  Lungs: Clear to auscultation, no wheezing or rhonchi noted. No increased vocal fremitus resonant to percussion  Abdomen: Soft, nontender, nondistended, positive bowel sounds, no masses no hepatosplenomegaly noted..  Neuro: No focal neurological deficits noted cranial nerves II through XII grossly intact. DTRs 2+ bilaterally upper and lower extremities. Strength 5 out of 5 in bilateral upper and lower extremities. Musculoskeletal: No warm swelling or erythema around joints, no spinal tenderness noted. Psychiatric: Patient alert and oriented x3, good insight and cognition, good  recent to remote recall. Lymph node survey: No cervical axillary or inguinal lymphadenopathy noted.  Lab Results:  Basic Metabolic Panel:    Component Value Date/Time   NA 139 03/11/2021 1635   NA 142 07/02/2020 1040   K 3.7 03/11/2021 1635   CL 107 03/11/2021 1635   CO2 27 03/11/2021 1635   BUN 10 03/11/2021 1635   BUN 8 07/02/2020 1040   CREATININE 0.57 03/11/2021 1635   GLUCOSE 95 03/11/2021 1635   CALCIUM 8.8 (L) 03/11/2021 1635   CBC:    Component Value Date/Time   WBC 7.5 03/12/2021 0521   HGB 9.8 (L) 03/12/2021 0521   HGB 9.8 (L) 07/02/2020 1040   HCT 28.0 (L) 03/12/2021 0521   HCT 28.9 (L) 07/02/2020 1040   PLT 427 (H) 03/12/2021 0521   PLT 628 (H) 07/02/2020 1040   MCV 87.0 03/12/2021 0521   MCV 88 07/02/2020 1040   NEUTROABS 1.7 03/11/2021 1238   NEUTROABS 3.0 07/02/2020 1040   LYMPHSABS 3.1 03/11/2021 1238   LYMPHSABS 3.5 (H) 07/02/2020 1040   MONOABS 0.6 03/11/2021 1238   EOSABS 0.1 03/11/2021 1238   EOSABS 0.2 07/02/2020 1040   BASOSABS 0.0 03/11/2021 1238   BASOSABS 0.1 07/02/2020 1040    Recent Results (from the past 240 hour(s))  Resp Panel by RT-PCR (Flu A&B, Covid) Nasopharyngeal Swab     Status: None   Collection Time: 03/11/21  1:54 PM   Specimen: Nasopharyngeal Swab; Nasopharyngeal(NP) swabs in vial transport medium  Result Value Ref Range Status  SARS Coronavirus 2 by RT PCR NEGATIVE NEGATIVE Final    Comment: (NOTE) SARS-CoV-2 target nucleic acids are NOT DETECTED.  The SARS-CoV-2 RNA is generally detectable in upper respiratory specimens during the acute phase of infection. The lowest concentration of SARS-CoV-2 viral copies this assay can detect is 138 copies/mL. A negative result does not preclude SARS-Cov-2 infection and should not be used as the sole basis for treatment or other patient management decisions. A negative result may occur with  improper specimen collection/handling, submission of specimen other than nasopharyngeal  swab, presence of viral mutation(s) within the areas targeted by this assay, and inadequate number of viral copies(<138 copies/mL). A negative result must be combined with clinical observations, patient history, and epidemiological information. The expected result is Negative.  Fact Sheet for Patients:  EntrepreneurPulse.com.au  Fact Sheet for Healthcare Providers:  IncredibleEmployment.be  This test is no t yet approved or cleared by the [place] FDA and  has been authorized for detection and/or diagnosis of SARS-CoV-2 by FDA under an Emergency Use Authorization (EUA). This EUA will remain  in effect (meaning this test can be used) for the duration of the COVID-19 declaration under Section 564(b)(1) of the Act, 21 U.S.C.section 360bbb-3(b)(1), unless the authorization is terminated  or revoked sooner.       Influenza A by PCR NEGATIVE NEGATIVE Final   Influenza B by PCR NEGATIVE NEGATIVE Final    Comment: (NOTE) The Xpert Xpress SARS-CoV-2/FLU/RSV plus assay is intended as an aid in the diagnosis of influenza from Nasopharyngeal swab specimens and should not be used as a sole basis for treatment. Nasal washings and aspirates are unacceptable for Xpert Xpress SARS-CoV-2/FLU/RSV testing.  Fact Sheet for Patients: EntrepreneurPulse.com.au  Fact Sheet for Healthcare Providers: IncredibleEmployment.be  This test is not yet approved or cleared by the [place] FDA and has been authorized for detection and/or diagnosis of SARS-CoV-2 by FDA under an Emergency Use Authorization (EUA). This EUA will remain in effect (meaning this test can be used) for the duration of the COVID-19 declaration under Section 564(b)(1) of the Act, 21 U.S.C. section 360bbb-3(b)(1), unless the authorization is terminated or revoked.  Performed at [facility], [place] [address], [place], [place] [address]      Studies/Results: No results found.  Medications: Scheduled Meds: . citalopram  20 mg Oral Daily  . enoxaparin (LOVENOX) injection  40 mg Subcutaneous Q24H  . folic acid  1 mg Oral Daily  . HYDROmorphone   Intravenous Q4H  . ketorolac  15 mg Intravenous Q6H  . morphine  60 mg Oral Q12H  . senna-docusate  1 tablet Oral BID   Continuous Infusions: . diphenhydrAMINE     PRN Meds:.diphenhydrAMINE **OR** diphenhydrAMINE, naloxone **AND** sodium chloride flush, ondansetron (ZOFRAN) IV, oxyCODONE, polyethylene glycol  Consultants:  None  Procedures:  None  Antibiotics:  None  Assessment/Plan: Principal Problem:   Sickle cell pain crisis (HCC) Active Problems:   Chronic prescription opiate use  Sickle cell disease with pain crisis: Continue IV fluids at [place] IV Dilaudid PCA, continue to decrease settings Oxycodone 10 mg every 4 hours as needed for severe breakthrough pain Toradol 15 mg IV every 6 hours for total of 5 days Monitor vital signs very closely, reevaluate pain scale regularly, and supplemental oxygen as needed.  Sickle cell anemia: Hemoglobin is stable and consistent with patient's baseline.  There is no clinical indication for blood transfusion at this time.  Continue to follow closely.  Chronic pain syndrome: Continue  home medications Code Status: Full Code Family Communication: N/A Disposition Plan: Not yet ready for discharge  [place], MSN, FNP-C Patient [place] [address] [place], [place] [address] [phone]  If 7PM-7AM, please contact night-coverage.  03/13/2021, 3:08 PM  LOS: 2 days

## 2021-03-13 NOTE — Plan of Care (Signed)
Pt feeling better; no s/s of acute distress or pain reported or observed. Call light within reach with bed at lowest position for safety.

## 2021-03-14 DIAGNOSIS — M25511 Pain in right shoulder: Secondary | ICD-10-CM | POA: Diagnosis not present

## 2021-03-14 DIAGNOSIS — Z79891 Long term (current) use of opiate analgesic: Secondary | ICD-10-CM

## 2021-03-14 DIAGNOSIS — D5700 Hb-SS disease with crisis, unspecified: Secondary | ICD-10-CM | POA: Diagnosis not present

## 2021-03-14 LAB — URINALYSIS, ROUTINE W REFLEX MICROSCOPIC
Bilirubin Urine: NEGATIVE
Glucose, UA: NEGATIVE mg/dL
Hgb urine dipstick: NEGATIVE
Ketones, ur: 20 mg/dL — AB
Leukocytes,Ua: NEGATIVE
Nitrite: NEGATIVE
Protein, ur: NEGATIVE mg/dL
Specific Gravity, Urine: 1.012 (ref 1.005–1.030)
pH: 6 (ref 5.0–8.0)

## 2021-03-14 LAB — HEPATITIS PANEL, ACUTE
HCV Ab: NONREACTIVE
Hep A IgM: NONREACTIVE
Hep B C IgM: NONREACTIVE
Hepatitis B Surface Ag: NONREACTIVE

## 2021-03-14 LAB — HIV ANTIBODY (ROUTINE TESTING W REFLEX): HIV Screen 4th Generation wRfx: NONREACTIVE

## 2021-03-14 NOTE — Progress Notes (Signed)
Patient ID: Carmen [name], female   DOB: 1991/08/06, 30 y.o.   MRN: [id number] Subjective: Carmen [name] is a 30 year old female with a medical history significant for sickle cell disease, chronic pain syndrome, opiate dependence and tolerance, and history of anemia of chronic disease was admitted for sickle cell pain crisis.  Patient has no new complaint today. She said her pain is much improved but still not at her baseline. Her pain is localized to her right shoulder, lower back and lower extremities. She denies any fever, cough, chest pain, shortness of breath, dizziness, nausea, vomiting or diarrhea. No urinary symptoms.  Objective:  Vital signs in last 24 hours:  Vitals:   03/14/21 0435 03/14/21 0732 03/14/21 1056 03/14/21 1253  BP: 125/73  (!) 130/91   Pulse: 74  78   Resp: 15 14 15 14   Temp: 98.1 F (36.7 C)  98.5 F (36.9 C)   TempSrc:   Oral   SpO2: 96% 97% 99% 99%  Weight:      Height:        Intake/Output from previous day:   Intake/Output Summary (Last 24 hours) at 03/14/2021 1540 Last data filed at 03/14/2021 1301 Gross per 24 hour  Intake 510 ml  Output 2250 ml  Net -1740 ml    Physical Exam: General: Alert, awake, oriented x3, in no acute distress.  HEENT: [place]/AT PEERL, EOMI Neck: Trachea midline,  no masses, no thyromegal,y no JVD, no carotid bruit OROPHARYNX:  Moist, No exudate/ erythema/lesions.  Heart: Regular rate and rhythm, without murmurs, rubs, gallops, PMI non-displaced, no heaves or thrills on palpation.  Lungs: Clear to auscultation, no wheezing or rhonchi noted. No increased vocal fremitus resonant to percussion  Abdomen: Soft, nontender, nondistended, positive bowel sounds, no masses no hepatosplenomegaly noted..  Neuro: No focal neurological deficits noted cranial nerves II through XII grossly intact. DTRs 2+ bilaterally upper and lower extremities. Strength 5 out of 5 in bilateral upper and lower extremities. Musculoskeletal: No warm swelling or  erythema around joints, no spinal tenderness noted. Psychiatric: Patient alert and oriented x3, good insight and cognition, good recent to remote recall. Lymph node survey: No cervical axillary or inguinal lymphadenopathy noted.  Lab Results:  Basic Metabolic Panel:    Component Value Date/Time   NA 139 03/11/2021 1635   NA 142 07/02/2020 1040   K 3.7 03/11/2021 1635   CL 107 03/11/2021 1635   CO2 27 03/11/2021 1635   BUN 10 03/11/2021 1635   BUN 8 07/02/2020 1040   CREATININE 0.57 03/11/2021 1635   GLUCOSE 95 03/11/2021 1635   CALCIUM 8.8 (L) 03/11/2021 1635   CBC:    Component Value Date/Time   WBC 7.5 03/12/2021 0521   HGB 9.8 (L) 03/12/2021 0521   HGB 9.8 (L) 07/02/2020 1040   HCT 28.0 (L) 03/12/2021 0521   HCT 28.9 (L) 07/02/2020 1040   PLT 427 (H) 03/12/2021 0521   PLT 628 (H) 07/02/2020 1040   MCV 87.0 03/12/2021 0521   MCV 88 07/02/2020 1040   NEUTROABS 1.7 03/11/2021 1238   NEUTROABS 3.0 07/02/2020 1040   LYMPHSABS 3.1 03/11/2021 1238   LYMPHSABS 3.5 (H) 07/02/2020 1040   MONOABS 0.6 03/11/2021 1238   EOSABS 0.1 03/11/2021 1238   EOSABS 0.2 07/02/2020 1040   BASOSABS 0.0 03/11/2021 1238   BASOSABS 0.1 07/02/2020 1040    Recent Results (from the past 240 hour(s))  Resp Panel by RT-PCR (Flu A&B, Covid) Nasopharyngeal Swab     Status: None  Collection Time: 03/11/21  1:54 PM   Specimen: Nasopharyngeal Swab; Nasopharyngeal(NP) swabs in vial transport medium  Result Value Ref Range Status   SARS Coronavirus 2 by RT PCR NEGATIVE NEGATIVE Final    Comment: (NOTE) SARS-CoV-2 target nucleic acids are NOT DETECTED.  The SARS-CoV-2 RNA is generally detectable in upper respiratory specimens during the acute phase of infection. The lowest concentration of SARS-CoV-2 viral copies this assay can detect is 138 copies/mL. A negative result does not preclude SARS-Cov-2 infection and should not be used as the sole basis for treatment or other patient management  decisions. A negative result may occur with  improper specimen collection/handling, submission of specimen other than nasopharyngeal swab, presence of viral mutation(s) within the areas targeted by this assay, and inadequate number of viral copies(<138 copies/mL). A negative result must be combined with clinical observations, patient history, and epidemiological information. The expected result is Negative.  Fact Sheet for Patients:  EntrepreneurPulse.com.au  Fact Sheet for Healthcare Providers:  IncredibleEmployment.be  This test is no t yet approved or cleared by the [place] FDA and  has been authorized for detection and/or diagnosis of SARS-CoV-2 by FDA under an Emergency Use Authorization (EUA). This EUA will remain  in effect (meaning this test can be used) for the duration of the COVID-19 declaration under Section 564(b)(1) of the Act, 21 U.S.C.section 360bbb-3(b)(1), unless the authorization is terminated  or revoked sooner.       Influenza A by PCR NEGATIVE NEGATIVE Final   Influenza B by PCR NEGATIVE NEGATIVE Final    Comment: (NOTE) The Xpert Xpress SARS-CoV-2/FLU/RSV plus assay is intended as an aid in the diagnosis of influenza from Nasopharyngeal swab specimens and should not be used as a sole basis for treatment. Nasal washings and aspirates are unacceptable for Xpert Xpress SARS-CoV-2/FLU/RSV testing.  Fact Sheet for Patients: EntrepreneurPulse.com.au  Fact Sheet for Healthcare Providers: IncredibleEmployment.be  This test is not yet approved or cleared by the [place] FDA and has been authorized for detection and/or diagnosis of SARS-CoV-2 by FDA under an Emergency Use Authorization (EUA). This EUA will remain in effect (meaning this test can be used) for the duration of the COVID-19 declaration under Section 564(b)(1) of the Act, 21 U.S.C. section 360bbb-3(b)(1), unless the  authorization is terminated or revoked.  Performed at [facility], [place] [address] [place], [place] [address]     Studies/Results: No results found.  Medications: Scheduled Meds: . citalopram  20 mg Oral Daily  . enoxaparin (LOVENOX) injection  40 mg Subcutaneous Q24H  . folic acid  1 mg Oral Daily  . HYDROmorphone   Intravenous Q4H  . ketorolac  15 mg Intravenous Q6H  . morphine  60 mg Oral Q12H  . senna-docusate  1 tablet Oral BID   Continuous Infusions: . diphenhydrAMINE     PRN Meds:.diphenhydrAMINE **OR** diphenhydrAMINE, naloxone **AND** sodium chloride flush, ondansetron (ZOFRAN) IV, oxyCODONE, polyethylene glycol  Consultants:  None  Procedures:  None  Antibiotics:  None  Assessment/Plan: Principal Problem:   Sickle cell pain crisis ([place]) Active Problems:   Chronic prescription opiate use  1. Hb Sickle Cell Disease with crisis: Continue IVF at [facility], continue weight based Dilaudid PCA at current setting, continue oxycodone 10 mg tablet p.o. every 4 hourly as needed severe breakthrough pain. IV Toradol 15 mg Q 6 H for total of 5 days, Monitor vitals very closely, Re-evaluate pain scale regularly, 2 L of Oxygen by [name]. 2. Sickle Cell Anemia: Hemoglobin is stable  at baseline today. He has no clinical indication for blood transfusion today. We will continue to monitor closely. 3. Chronic pain Syndrome: Continue home pain medications. 4. Grief reaction: Patient recently lost her sister to complications of sickle cell disease. She is still grieving but she is getting better. Patient counseled extensively and encouraged to speak up and discuss her thoughts with care providers and family members. Patient verbalized understanding and was appreciative of her care.  Code Status: Full Code Family Communication: N/A Disposition Plan: Not yet ready for discharge  Graci Hulce  If 7PM-7AM, please contact night-coverage.  03/14/2021, 3:40 PM  LOS:  3 days

## 2021-03-15 ENCOUNTER — Other Ambulatory Visit: Payer: Self-pay | Admitting: Internal Medicine

## 2021-03-15 DIAGNOSIS — D5700 Hb-SS disease with crisis, unspecified: Secondary | ICD-10-CM | POA: Diagnosis not present

## 2021-03-15 DIAGNOSIS — Z79891 Long term (current) use of opiate analgesic: Secondary | ICD-10-CM | POA: Diagnosis not present

## 2021-03-15 DIAGNOSIS — M25511 Pain in right shoulder: Secondary | ICD-10-CM | POA: Diagnosis not present

## 2021-03-15 LAB — CBC WITH DIFFERENTIAL/PLATELET
Abs Immature Granulocytes: 0.04 10*3/uL (ref 0.00–0.07)
Basophils Absolute: 0 10*3/uL (ref 0.0–0.1)
Basophils Relative: 0 %
Eosinophils Absolute: 0.2 10*3/uL (ref 0.0–0.5)
Eosinophils Relative: 2 %
HCT: 28.1 % — ABNORMAL LOW (ref 36.0–46.0)
Hemoglobin: 10 g/dL — ABNORMAL LOW (ref 12.0–15.0)
Immature Granulocytes: 0 %
Lymphocytes Relative: 17 %
Lymphs Abs: 2.1 10*3/uL (ref 0.7–4.0)
MCH: 30.2 pg (ref 26.0–34.0)
MCHC: 35.6 g/dL (ref 30.0–36.0)
MCV: 84.9 fL (ref 80.0–100.0)
Monocytes Absolute: 1.1 10*3/uL — ABNORMAL HIGH (ref 0.1–1.0)
Monocytes Relative: 9 %
Neutro Abs: 8.9 10*3/uL — ABNORMAL HIGH (ref 1.7–7.7)
Neutrophils Relative %: 72 %
Platelets: 403 10*3/uL — ABNORMAL HIGH (ref 150–400)
RBC: 3.31 MIL/uL — ABNORMAL LOW (ref 3.87–5.11)
RDW: 14.4 % (ref 11.5–15.5)
WBC: 12.4 10*3/uL — ABNORMAL HIGH (ref 4.0–10.5)
nRBC: 0 % (ref 0.0–0.2)

## 2021-03-15 LAB — RPR: RPR Ser Ql: NONREACTIVE

## 2021-03-15 MED ORDER — OXYCODONE HCL 10 MG PO TABS: 10.0000 mg | ORAL_TABLET | Freq: Four times a day (QID) | ORAL | 0 refills | Status: DC | PRN

## 2021-03-15 NOTE — Discharge Instructions (Signed)
Managing Loss, Adult People experience loss in many different ways throughout their lives. Events such as moving, changing jobs, and losing friends can create a sense of loss. The loss may be as serious as a major health change, divorce, death of a pet, or death of a loved one. All of these types of loss are likely to create a physical and emotional reaction known as grief. Grief is the result of a major change or an absence of something or someone that you count on. Grief is a normal reaction to loss. A variety of factors can affect your grieving experience, including:  The nature of your loss.  Your relationship to what or whom you lost.  Your understanding of grief and how to manage it.  Your support system. How to manage lifestyle changes Keep to your normal routine as much as possible.  If you have trouble focusing or doing normal activities, it is acceptable to take some time away from your normal routine.  Spend time with friends and loved ones.  Eat a healthy diet, get plenty of sleep, and rest when you feel tired.   How to recognize changes  The way that you deal with your grief will affect your ability to function as you normally do. When grieving, you may experience these changes:  Numbness, shock, sadness, anxiety, anger, denial, and guilt.  Thoughts about death.  Unexpected crying.  A physical sensation of emptiness in your stomach.  Problems sleeping and eating.  Tiredness (fatigue).  Loss of interest in normal activities.  Dreaming about or imagining seeing the person who died.  A need to remember what or whom you lost.  Difficulty thinking about anything other than your loss for a period of time.  Relief. If you have been expecting the loss for a while, you may feel a sense of relief when it happens. Follow these instructions at home: Activity Express your feelings in healthy ways, such as:  Talking with others about your loss. It may be helpful to find  others who have had a similar loss, such as a support group.  Writing down your feelings in a journal.  Doing physical activities to release stress and emotional energy.  Doing creative activities like painting, sculpting, or playing or listening to music.  Practicing resilience. This is the ability to recover and adjust after facing challenges. Reading some resources that encourage resilience may help you to learn ways to practice those behaviors.   General instructions  Be patient with yourself and others. Allow the grieving process to happen, and remember that grieving takes time. ? It is likely that you may never feel completely done with some grief. You may find a way to move on while still cherishing memories and feelings about your loss. ? Accepting your loss is a process. It can take months or longer to adjust.  Keep all follow-up visits as told by your health care provider. This is important. Where to find support To get support for managing loss:  Ask your health care provider for help and recommendations, such as grief counseling or therapy.  Think about joining a support group for people who are managing a loss. Where to find more information You can find more information about managing loss from:  American Society of Clinical Oncology: www.cancer.net  American Psychological Association: www.apa.org Contact a health care provider if:  Your grief is extreme and keeps getting worse.  You have ongoing grief that does not improve.  Your body shows symptoms   of grief, such as illness.  You feel depressed, anxious, or lonely. Get help right away if:  You have thoughts about hurting yourself or others. If you ever feel like you may hurt yourself or others, or have thoughts about taking your own life, get help right away. You can go to your nearest emergency department or call:  Your local emergency services (911 in the [place]  A suicide crisis helpline, such as the  [facility] at 7734259302. This is open 24 hours a day. Summary  Grief is the result of a major change or an absence of someone or something that you count on. Grief is a normal reaction to loss.  The depth of grief and the period of recovery depend on the type of loss and your ability to adjust to the change and process your feelings.  Processing grief requires patience and a willingness to accept your feelings and talk about your loss with people who are supportive.  It is important to find resources that work for you and to realize that people experience grief differently. There is not one grieving process that works for everyone in the same way.  Be aware that when grief becomes extreme, it can lead to more severe issues like isolation, depression, anxiety, or suicidal thoughts. Talk with your health care provider if you have any of these issues. This information is not intended to replace advice given to you by your health care provider. Make sure you discuss any questions you have with your health care provider. Document Revised: 04/10/2020 Document Reviewed: 04/10/2020 Elsevier Patient Education  2021 [place]. Sickle Cell Anemia, Adult  Sickle cell anemia is a condition in which red blood cells have an abnormal "sickle" shape. Red blood cells carry oxygen through the body. Sickle-shaped red blood cells do not live as long as normal red blood cells. They also clump together and block blood from flowing through the blood vessels. This condition prevents the body from getting enough oxygen. Sickle cell anemia causes organ damage and pain. It also increases the risk of infection. What are the causes? This condition is caused by a gene that is passed from parent to child (inherited). Receiving two copies of the gene causes the disease. Receiving one copy causes the "trait," which means that symptoms are milder or not present. What increases the risk? This  condition is more likely to develop if your ancestors were from [place], the [place], [place] or [place], the [place], [place], or the [place]. What are the signs or symptoms? Symptoms of this condition include:  Episodes of pain (crises), especially in the hands and feet, joints, back, chest, or abdomen. The pain can be triggered by: ? An illness, especially if there is dehydration. ? Doing an activity with great effort (overexertion). ? Exposure to extreme temperature changes. ? High altitude.  Fatigue.  Shortness of breath or difficulty breathing.  Dizziness.  Pale skin or yellowed skin (jaundice).  Frequent bacterial infections.  Pain and swelling in the hands and feet (hand-food syndrome).  Prolonged, painful erection of the penis (priapism).  Acute chest syndrome. Symptoms of this include: ? Chest pain. ? Fever. ? Cough. ? Fast breathing.  Stroke.  Decreased activity.  Loss of appetite.  Change in behavior.  Headaches.  Seizures.  Vision changes.  Skin ulcers.  Heart disease.  High blood pressure.  Gallstones.  Liver and kidney problems. How is this diagnosed? This condition is diagnosed with blood tests that check for  the gene that causes this condition. How is this treated? There is no cure for most cases of this condition. Treatment focuses on managing your symptoms and preventing complications of the disease. Your health care provider will work with you to identify the best treatment options for you based on an assessment of your condition. Treatment may include:  Medicines, including: ? Pain medicines. ? Antibiotic medicines for infection. ? Medicines to increase the production of a protein in red blood cells that helps carry oxygen in the body (hemoglobin).  Fluids to treat pain and swelling.  Oxygen to treat acute chest syndrome.  Blood transfusions to treat symptoms such as fatigue, stroke, and acute chest  syndrome.  Massage and physical therapy for pain.  Regular tests to monitor your condition, such as blood tests, X-rays, CT scans, MRI scans, ultrasounds, and lung function tests. These should be done every 3-12 months, depending on your age.  Hematopoietic stem cell transplant. This is a procedure to replace abnormal stem cells with healthy stem cells from a donor's bone marrow. Stem cells are cells that can develop into blood cells, and bone marrow is the spongy tissue inside the bones. Follow these instructions at home: Medicines  Take over-the-counter and prescription medicines only as told by your health care provider.  If you were prescribed an antibiotic medicine, take it as told by your health care provider. Do not stop taking the antibiotic even if you start to feel better.  If you develop a fever, do not take medicines to reduce the fever right away. This could cover up another problem. Notify your health care provider. Managing pain, stiffness, and swelling  Try these methods to help ease your pain: ? Using a heating pad. ? Taking a warm bath. ? Distracting yourself, such as by watching TV. Eating and drinking  Drink enough fluid to keep your urine clear or pale yellow. Drink more in hot weather and during exercise.  Limit or avoid drinking alcohol.  Eat a balanced and nutritious diet. Eat plenty of fruits, vegetables, whole grains, and lean protein.  Take vitamins and supplements as directed by your health care provider. Traveling  When traveling, keep these with you: ? Your medical information. ? The names of your health care providers. ? Your medicines.  If you have to travel by air, ask about precautions you should take. Activity  Get plenty of rest.  Avoid activities that will lower your oxygen levels, such as exercising vigorously. General instructions  Do not use any products that contain nicotine or tobacco, such as cigarettes and e-cigarettes. They  lower blood oxygen levels. If you need help quitting, ask your health care provider.  Consider wearing a medical alert bracelet.  Avoid high altitudes.  Avoid extreme temperatures and extreme temperature changes.  Keep all follow-up visits as told by your health care provider. This is important. Contact a health care provider if:  You develop joint pain.  Your feet or hands swell or have pain.  You have fatigue. Get help right away if:  You have symptoms of infection. These include: ? Fever. ? Chills. ? Extreme tiredness. ? Irritability. ? Poor eating. ? Vomiting.  You feel dizzy or faint.  You have new abdominal pain, especially on the left side near the stomach area.  You develop priapism.  You have numbness in your arms or legs or have trouble moving them.  You have trouble talking.  You develop pain that cannot be controlled with medicine.  You become  short of breath.  You have rapid breathing.  You have a persistent cough.  You have pain in your chest.  You develop a severe headache or stiff neck.  You feel bloated without eating or after eating a small amount of food.  Your skin is pale.  You suddenly lose vision. Summary  Sickle cell anemia is a condition in which red blood cells have an abnormal "sickle" shape. This disease can cause organ damage and chronic pain, and it can raise your risk of infection.  Sickle cell anemia is a genetic disorder.  Treatment focuses on managing your symptoms and preventing complications of the disease.  Get medical help right away if you have any signs of infection, such as a fever. This information is not intended to replace advice given to you by your health care provider. Make sure you discuss any questions you have with your health care provider. Document Revised: 03/13/2020 Document Reviewed: 03/13/2020 Elsevier Patient Education  [employer].

## 2021-03-15 NOTE — Progress Notes (Signed)
Patient discharged to home with family, discharge instructions reviewed with patient who verbalized understanding. 

## 2021-03-15 NOTE — Discharge Summary (Signed)
Physician Discharge Summary  Carmen [name] VVO:[id number] DOB: Feb 16, 1991 DOA: 03/11/2021  PCP: Vevelyn Francois, NP  Admit date: 03/11/2021  Discharge date: 03/15/2021  Discharge Diagnoses:  Principal Problem:   Sickle cell pain crisis [facility]) Active Problems:   Chronic prescription opiate use   Acute pain of right shoulder  Discharge Condition: Stable  Disposition:   Follow-up Information    Vevelyn Francois, NP. Schedule an appointment as soon as possible for a visit in 1 week(s).   Specialty: Adult Health Nurse Practitioner Contact information: [address] [name] [address] [phone]              Pt is discharged home in good condition and is to follow up with Vevelyn Francois, NP this week to have labs evaluated. Carmen [name] is instructed to increase activity slowly and balance with rest for the next few days, and use prescribed medication to complete treatment of pain  Diet: Regular Wt Readings from Last 3 Encounters:  03/13/21 79 kg  03/02/21 71.7 kg  01/18/21 78.5 kg   History of present illness:  Carmen [name]  is a 30 y.o. female with a medical history significant for sickle cell disease, chronic pain syndrome, opiate dependence and tolerance, history of anemia of chronic disease, and history of generalized anxiety presents with complaints of right shoulder pain that is consistent with previous sickle cell pain crisis.  Patient says that she has been experiencing pain primarily to right shoulder over the past 3 weeks.  She denies any injury. Patient says that she has been taking home opiate medication consistently without any sustained relief.  She last had MS Contin and oxycodone around 2:00 this morning without any improvement.  Patient attributes current pain crisis to increased stressors at home.  Her sister passed away from sickle cell disease several weeks ago and patient has been grieving.  She currently rates her pain at 10/10 characterized as  constant and aching.  Patient is very tearful.  She denies any fever, chills, headache, chest pain, shortness of breath.  No swelling to extremities, decreased range of motion, urinary symptoms, nausea, vomiting, or diarrhea.  Patient denies any sick contacts, recent travel, or exposure to COVID-19 infection.  ER course: Patient's vital signs are within normal range. She is afebrile and oxygen saturation is 98% on RA. Complete blood count shows hemoglobin of 10.2, otherwise unremarkable. Complete metabolic panel unremarkable.  Pain persists despite IV Dilaudid, IV fluids, and IV Toradol. Patient admitted to [place] for further management of sickle cell pain crisis  Hospital Course:  Patient was admitted for sickle cell pain crisis and managed appropriately with IVF, IV Dilaudid via PCA and IV Toradol, as well as other adjunct therapies per sickle cell pain management protocols. Patient did well on this admission regimen, pain slowly returned to baseline. She did not require blood transfusion during this admission. As of today, patient is ambulating well with no significant pain and tolerating p.o. intake well with no restrictions.  She claims she is coping well and so much better with her grief reactions. Patient was therefore discharged home today in a hemodynamically stable condition.   Carmen [name] will follow-up with PCP within 1 week of this discharge. Carmen [name] was counseled extensively about nonpharmacologic means of pain management, patient verbalized understanding and was appreciative of  the care received during this admission.   We discussed the need for good hydration, monitoring of hydration status, avoidance of heat, cold, stress, and infection triggers.  We discussed the need to be adherent with taking Hydrea and other home medications. Patient was reminded of the need to seek medical attention immediately if any symptom of bleeding, anemia, or infection occurs.  Discharge Exam: Vitals:   03/15/21  0840 03/15/21 0953  BP:  117/72  Pulse:  78  Resp: 15 15  Temp:  98.1 F (36.7 C)  SpO2: 95% 93%   Vitals:   03/15/21 0359 03/15/21 0809 03/15/21 0840 03/15/21 0953  BP:  124/73  117/72  Pulse:  77  78  Resp: 13 15 15 15   Temp:  98.5 F (36.9 C)  98.1 F (36.7 C)  TempSrc:  Oral  Oral  SpO2: 97% 99% 95% 93%  Weight:      Height:        General appearance : Awake, alert, not in any distress. Speech Clear. Not toxic looking HEENT: Atraumatic and Normocephalic, pupils equally reactive to light and accomodation Neck: Supple, no JVD. No cervical lymphadenopathy.  Chest: Good air entry bilaterally, no added sounds  CVS: S1 S2 regular, no murmurs.  Abdomen: Bowel sounds present, Non tender and not distended with no gaurding, rigidity or rebound. Extremities: B/L Lower Ext shows no edema, both legs are warm to touch Neurology: Awake alert, and oriented X 3, CN II-XII intact, Non focal Skin: No Rash  Discharge Instructions  Discharge Instructions    Diet - low sodium heart healthy   Complete by: As directed    Increase activity slowly   Complete by: As directed      Allergies as of 03/15/2021   No Known Allergies     Medication List    TAKE these medications   citalopram 20 MG tablet Commonly known as: CELEXA TAKE 1 TABLET BY MOUTH EVERY DAY   cyclobenzaprine 10 MG tablet Commonly known as: FLEXERIL TAKE 1 TABLET BY MOUTH TWICE A DAY What changed:   when to take this  reasons to take this   folic acid 1 MG tablet Commonly known as: FOLVITE Take 1 tablet (1 mg total) by mouth daily.   morphine 60 MG 12 hr tablet Commonly known as: MS Contin Take 1 tablet (60 mg total) by mouth every 12 (twelve) hours.   Oxycodone HCl 10 MG Tabs Take 1 tablet (10 mg total) by mouth every 6 (six) hours as needed for up to 15 days (pain).   Prenatal Vitamin Plus Low Iron 27-1 MG Tabs TAKE 1 TABLET BY MOUTH EVERY DAY   Vitamin D (Ergocalciferol) 1.25 MG (50000 UNIT) Caps  capsule Commonly known as: DRISDOL TAKE ONE CAPSULE BY MOUTH ONE TIME PER WEEK What changed:   how much to take  how to take this  when to take this  additional instructions       The results of significant diagnostics from this hospitalization (including imaging, microbiology, ancillary and laboratory) are listed below for reference.    Significant Diagnostic Studies: DG Shoulder Right  Result Date: 03/11/2021 CLINICAL DATA:  Right upper arm and back pain.  Sickle cell crisis. EXAM: RIGHT SHOULDER - 2+ VIEW COMPARISON:  None. FINDINGS: There is no evidence of fracture or dislocation. There is no evidence of arthropathy or other focal bone abnormality. Soft tissues are unremarkable. IMPRESSION: No acute osseous injury of the right shoulder. Electronically Signed   By: Kathreen Devoid   On: 03/11/2021 13:25    Microbiology: Recent Results (from the past 240 hour(s))  Resp Panel by RT-PCR (Flu A&B, Covid) Nasopharyngeal Swab  Status: None   Collection Time: 03/11/21  1:54 PM   Specimen: Nasopharyngeal Swab; Nasopharyngeal(NP) swabs in vial transport medium  Result Value Ref Range Status   SARS Coronavirus 2 by RT PCR NEGATIVE NEGATIVE Final    Comment: (NOTE) SARS-CoV-2 target nucleic acids are NOT DETECTED.  The SARS-CoV-2 RNA is generally detectable in upper respiratory specimens during the acute phase of infection. The lowest concentration of SARS-CoV-2 viral copies this assay can detect is 138 copies/mL. A negative result does not preclude SARS-Cov-2 infection and should not be used as the sole basis for treatment or other patient management decisions. A negative result may occur with  improper specimen collection/handling, submission of specimen other than nasopharyngeal swab, presence of viral mutation(s) within the areas targeted by this assay, and inadequate number of viral copies(<138 copies/mL). A negative result must be combined with clinical observations, patient  history, and epidemiological information. The expected result is Negative.  Fact Sheet for Patients:  EntrepreneurPulse.com.au  Fact Sheet for Healthcare Providers:  IncredibleEmployment.be  This test is no t yet approved or cleared by the [place] FDA and  has been authorized for detection and/or diagnosis of SARS-CoV-2 by FDA under an Emergency Use Authorization (EUA). This EUA will remain  in effect (meaning this test can be used) for the duration of the COVID-19 declaration under Section 564(b)(1) of the Act, 21 U.S.C.section 360bbb-3(b)(1), unless the authorization is terminated  or revoked sooner.       Influenza A by PCR NEGATIVE NEGATIVE Final   Influenza B by PCR NEGATIVE NEGATIVE Final    Comment: (NOTE) The Xpert Xpress SARS-CoV-2/FLU/RSV plus assay is intended as an aid in the diagnosis of influenza from Nasopharyngeal swab specimens and should not be used as a sole basis for treatment. Nasal washings and aspirates are unacceptable for Xpert Xpress SARS-CoV-2/FLU/RSV testing.  Fact Sheet for Patients: EntrepreneurPulse.com.au  Fact Sheet for Healthcare Providers: IncredibleEmployment.be  This test is not yet approved or cleared by the [place] FDA and has been authorized for detection and/or diagnosis of SARS-CoV-2 by FDA under an Emergency Use Authorization (EUA). This EUA will remain in effect (meaning this test can be used) for the duration of the COVID-19 declaration under Section 564(b)(1) of the Act, 21 U.S.C. section 360bbb-3(b)(1), unless the authorization is terminated or revoked.  Performed at [facility], [place] [address] [place], [place] [address]      Labs: Basic Metabolic Panel: Recent Labs  Lab 03/11/21 1238 03/11/21 1635  NA 142 139  K 3.6 3.7  CL 110 107  CO2 26 27  GLUCOSE 104* 95  BUN 10 10  CREATININE 0.45 0.57  CALCIUM 9.1 8.8*    Liver Function Tests: Recent Labs  Lab 03/11/21 1238  AST 17  ALT 11  ALKPHOS 49  BILITOT 0.8  PROT 6.9  ALBUMIN 3.7   No results for input(s): LIPASE, AMYLASE in the last 168 hours. No results for input(s): AMMONIA in the last 168 hours. CBC: Recent Labs  Lab 03/11/21 1238 03/12/21 0521 03/15/21 0829  WBC 5.6 7.5 12.4*  NEUTROABS 1.7  --  8.9*  HGB 10.2* 9.8* 10.0*  HCT 29.0* 28.0* 28.1*  MCV 85.3 87.0 84.9  PLT 510* 427* 403*   Cardiac Enzymes: No results for input(s): CKTOTAL, CKMB, CKMBINDEX, TROPONINI in the last 168 hours. BNP: Invalid input(s): POCBNP CBG: No results for input(s): GLUCAP in the last 168 hours.  Time coordinating discharge: 50 minutes  Signed:  Angelica Chessman  [facility] 03/15/2021, 12:46 PM

## 2021-03-16 ENCOUNTER — Other Ambulatory Visit: Payer: Self-pay | Admitting: Nurse Practitioner

## 2021-03-16 ENCOUNTER — Other Ambulatory Visit: Payer: Self-pay

## 2021-03-16 LAB — GC/CHLAMYDIA PROBE AMP (~~LOC~~) NOT AT ARMC
Chlamydia: NEGATIVE
Comment: NEGATIVE
Comment: NORMAL
Neisseria Gonorrhea: NEGATIVE

## 2021-03-16 MED ORDER — OXYCODONE HCL 10 MG PO TABS: 10.0000 mg | ORAL_TABLET | Freq: Four times a day (QID) | ORAL | 0 refills | Status: DC | PRN

## 2021-03-19 ENCOUNTER — Other Ambulatory Visit: Payer: Self-pay

## 2021-03-19 DIAGNOSIS — Z79891 Long term (current) use of opiate analgesic: Secondary | ICD-10-CM

## 2021-03-19 DIAGNOSIS — D571 Sickle-cell disease without crisis: Secondary | ICD-10-CM

## 2021-03-20 ENCOUNTER — Other Ambulatory Visit: Payer: Self-pay

## 2021-03-20 ENCOUNTER — Telehealth: Payer: Self-pay | Admitting: Nurse Practitioner

## 2021-03-20 ENCOUNTER — Other Ambulatory Visit: Payer: Self-pay | Admitting: Nurse Practitioner

## 2021-03-20 DIAGNOSIS — Z79891 Long term (current) use of opiate analgesic: Secondary | ICD-10-CM

## 2021-03-20 DIAGNOSIS — D571 Sickle-cell disease without crisis: Secondary | ICD-10-CM

## 2021-03-20 MED ORDER — MORPHINE SULFATE ER 60 MG PO TBCR: 60.0000 mg | EXTENDED_RELEASE_TABLET | Freq: Two times a day (BID) | ORAL | 0 refills | Status: DC

## 2021-03-20 NOTE — Telephone Encounter (Signed)
This was sent on 03/16/2021 to [facility]

## 2021-03-20 NOTE — Progress Notes (Signed)
   [facility] [address] [place], [place]  [address] Phone:  [phone]   Fax:  [phone] 

## 2021-03-23 ENCOUNTER — Telehealth (HOSPITAL_COMMUNITY): Payer: Self-pay | Admitting: General Practice

## 2021-03-23 ENCOUNTER — Inpatient Hospital Stay (HOSPITAL_COMMUNITY)
Admission: AD | Admit: 2021-03-23 | Discharge: 2021-03-26 | DRG: 812 | Disposition: A | Discharge status: Home / Self Care | Payer: Medicaid Other | Source: Ambulatory Visit | Attending: Internal Medicine | Admitting: Internal Medicine

## 2021-03-23 ENCOUNTER — Encounter (HOSPITAL_COMMUNITY): Payer: Self-pay | Admitting: Internal Medicine

## 2021-03-23 DIAGNOSIS — Z79899 Other long term (current) drug therapy: Secondary | ICD-10-CM | POA: Diagnosis not present

## 2021-03-23 DIAGNOSIS — M7918 Myalgia, other site: Secondary | ICD-10-CM | POA: Diagnosis present

## 2021-03-23 DIAGNOSIS — G8929 Other chronic pain: Secondary | ICD-10-CM | POA: Diagnosis not present

## 2021-03-23 DIAGNOSIS — F419 Anxiety disorder, unspecified: Secondary | ICD-10-CM | POA: Diagnosis present

## 2021-03-23 DIAGNOSIS — Z9049 Acquired absence of other specified parts of digestive tract: Secondary | ICD-10-CM | POA: Diagnosis not present

## 2021-03-23 DIAGNOSIS — F17210 Nicotine dependence, cigarettes, uncomplicated: Secondary | ICD-10-CM | POA: Diagnosis present

## 2021-03-23 DIAGNOSIS — Z9081 Acquired absence of spleen: Secondary | ICD-10-CM

## 2021-03-23 DIAGNOSIS — Z832 Family history of diseases of the blood and blood-forming organs and certain disorders involving the immune mechanism: Secondary | ICD-10-CM | POA: Diagnosis not present

## 2021-03-23 DIAGNOSIS — G894 Chronic pain syndrome: Secondary | ICD-10-CM | POA: Diagnosis present

## 2021-03-23 DIAGNOSIS — Z8261 Family history of arthritis: Secondary | ICD-10-CM | POA: Diagnosis not present

## 2021-03-23 DIAGNOSIS — Z8249 Family history of ischemic heart disease and other diseases of the circulatory system: Secondary | ICD-10-CM

## 2021-03-23 DIAGNOSIS — Z841 Family history of disorders of kidney and ureter: Secondary | ICD-10-CM | POA: Diagnosis not present

## 2021-03-23 DIAGNOSIS — D72829 Elevated white blood cell count, unspecified: Secondary | ICD-10-CM | POA: Diagnosis present

## 2021-03-23 DIAGNOSIS — D638 Anemia in other chronic diseases classified elsewhere: Secondary | ICD-10-CM | POA: Diagnosis present

## 2021-03-23 DIAGNOSIS — D75839 Thrombocytosis, unspecified: Secondary | ICD-10-CM | POA: Diagnosis present

## 2021-03-23 DIAGNOSIS — D5700 Hb-SS disease with crisis, unspecified: Secondary | ICD-10-CM | POA: Diagnosis present

## 2021-03-23 LAB — CBC WITH DIFFERENTIAL/PLATELET
Abs Immature Granulocytes: 0.12 10*3/uL — ABNORMAL HIGH (ref 0.00–0.07)
Basophils Absolute: 0.1 10*3/uL (ref 0.0–0.1)
Basophils Relative: 1 %
Eosinophils Absolute: 0.1 10*3/uL (ref 0.0–0.5)
Eosinophils Relative: 1 %
HCT: 27.4 % — ABNORMAL LOW (ref 36.0–46.0)
Hemoglobin: 9.8 g/dL — ABNORMAL LOW (ref 12.0–15.0)
Immature Granulocytes: 2 %
Lymphocytes Relative: 27 %
Lymphs Abs: 2.2 10*3/uL (ref 0.7–4.0)
MCH: 30.6 pg (ref 26.0–34.0)
MCHC: 35.8 g/dL (ref 30.0–36.0)
MCV: 85.6 fL (ref 80.0–100.0)
Monocytes Absolute: 0.9 10*3/uL (ref 0.1–1.0)
Monocytes Relative: 12 %
Neutro Abs: 4.8 10*3/uL (ref 1.7–7.7)
Neutrophils Relative %: 57 %
Platelets: 705 10*3/uL — ABNORMAL HIGH (ref 150–400)
RBC: 3.2 MIL/uL — ABNORMAL LOW (ref 3.87–5.11)
RDW: 16.4 % — ABNORMAL HIGH (ref 11.5–15.5)
WBC: 8.2 10*3/uL (ref 4.0–10.5)
nRBC: 9.9 % — ABNORMAL HIGH (ref 0.0–0.2)

## 2021-03-23 LAB — RETICULOCYTES
Immature Retic Fract: 47.6 % — ABNORMAL HIGH (ref 2.3–15.9)
RBC.: 3.24 MIL/uL — ABNORMAL LOW (ref 3.87–5.11)
Retic Count, Absolute: 256.3 10*3/uL — ABNORMAL HIGH (ref 19.0–186.0)
Retic Ct Pct: 7.9 % — ABNORMAL HIGH (ref 0.4–3.1)

## 2021-03-23 LAB — PREGNANCY, URINE: Preg Test, Ur: NEGATIVE

## 2021-03-23 LAB — LACTATE DEHYDROGENASE: LDH: 262 U/L — ABNORMAL HIGH (ref 98–192)

## 2021-03-23 MED ORDER — CITALOPRAM HYDROBROMIDE 20 MG PO TABS
20.0000 mg | ORAL_TABLET | Freq: Every day | ORAL | Status: DC
  Administered 2021-03-24 – 2021-03-25 (×2): 20 mg via ORAL
  Filled 2021-03-23 (×2): qty 1

## 2021-03-23 MED ORDER — DIPHENHYDRAMINE HCL 25 MG PO CAPS
25.0000 mg | ORAL_CAPSULE | ORAL | Status: DC | PRN
  Administered 2021-03-23: 25 mg via ORAL
  Filled 2021-03-23 (×2): qty 1

## 2021-03-23 MED ORDER — NALOXONE HCL 0.4 MG/ML IJ SOLN: 0.4000 mg | INTRAMUSCULAR | Status: DC | PRN

## 2021-03-23 MED ORDER — SODIUM CHLORIDE 0.9 % IV SOLN
25.0000 mg | INTRAVENOUS | Status: DC | PRN
  Filled 2021-03-23: qty 0.5

## 2021-03-23 MED ORDER — KETOROLAC TROMETHAMINE 30 MG/ML IJ SOLN: 30.0000 mg | Freq: Four times a day (QID) | INTRAMUSCULAR | Status: DC

## 2021-03-23 MED ORDER — KETOROLAC TROMETHAMINE 30 MG/ML IJ SOLN
30.0000 mg | Freq: Four times a day (QID) | INTRAMUSCULAR | Status: DC
  Administered 2021-03-23 – 2021-03-26 (×12): 30 mg via INTRAVENOUS
  Filled 2021-03-23 (×12): qty 1

## 2021-03-23 MED ORDER — POLYETHYLENE GLYCOL 3350 17 G PO PACK: 17.0000 g | PACK | Freq: Every day | ORAL | Status: DC | PRN

## 2021-03-23 MED ORDER — SENNOSIDES-DOCUSATE SODIUM 8.6-50 MG PO TABS
1.0000 | ORAL_TABLET | Freq: Two times a day (BID) | ORAL | Status: DC
  Administered 2021-03-23 – 2021-03-25 (×5): 1 via ORAL
  Filled 2021-03-23 (×5): qty 1

## 2021-03-23 MED ORDER — VITAMIN D (ERGOCALCIFEROL) 1.25 MG (50000 UNIT) PO CAPS
50000.0000 [IU] | ORAL_CAPSULE | ORAL | Status: DC
  Administered 2021-03-24: 50000 [IU] via ORAL
  Filled 2021-03-23: qty 1

## 2021-03-23 MED ORDER — PRENATAL MULTIVITAMIN CH
1.0000 | ORAL_TABLET | Freq: Every day | ORAL | Status: DC
  Administered 2021-03-25: 1 via ORAL
  Filled 2021-03-23 (×3): qty 1

## 2021-03-23 MED ORDER — MORPHINE SULFATE ER 30 MG PO TBCR: 60.0000 mg | EXTENDED_RELEASE_TABLET | Freq: Two times a day (BID) | ORAL | Status: DC

## 2021-03-23 MED ORDER — MORPHINE SULFATE ER 30 MG PO TBCR
60.0000 mg | EXTENDED_RELEASE_TABLET | Freq: Two times a day (BID) | ORAL | Status: DC
  Administered 2021-03-23 – 2021-03-25 (×5): 60 mg via ORAL
  Filled 2021-03-23 (×5): qty 2

## 2021-03-23 MED ORDER — SODIUM CHLORIDE 0.9% FLUSH: 9.0000 mL | INTRAVENOUS | Status: DC | PRN

## 2021-03-23 MED ORDER — CYCLOBENZAPRINE HCL 10 MG PO TABS
10.0000 mg | ORAL_TABLET | Freq: Every day | ORAL | Status: DC | PRN
  Administered 2021-03-25: 10 mg via ORAL
  Filled 2021-03-23: qty 1

## 2021-03-23 MED ORDER — ONDANSETRON HCL 4 MG/2ML IJ SOLN
4.0000 mg | Freq: Four times a day (QID) | INTRAMUSCULAR | Status: DC | PRN
  Administered 2021-03-23: 4 mg via INTRAVENOUS
  Filled 2021-03-23: qty 2

## 2021-03-23 MED ORDER — SENNOSIDES-DOCUSATE SODIUM 8.6-50 MG PO TABS: 1.0000 | ORAL_TABLET | Freq: Two times a day (BID) | ORAL | Status: DC

## 2021-03-23 MED ORDER — DEXTROSE-NACL 5-0.45 % IV SOLN: INTRAVENOUS | Status: DC

## 2021-03-23 MED ORDER — FOLIC ACID 1 MG PO TABS
1.0000 mg | ORAL_TABLET | Freq: Every day | ORAL | Status: DC
  Administered 2021-03-24 – 2021-03-25 (×2): 1 mg via ORAL
  Filled 2021-03-23 (×2): qty 1

## 2021-03-23 MED ORDER — HYDROMORPHONE 1 MG/ML IV SOLN
INTRAVENOUS | Status: DC
  Administered 2021-03-23: 30 mg via INTRAVENOUS
  Administered 2021-03-23: 12 mg via INTRAVENOUS
  Administered 2021-03-24: 30 mg via INTRAVENOUS
  Administered 2021-03-24: 6 mg via INTRAVENOUS
  Administered 2021-03-24: 6.5 mg via INTRAVENOUS
  Administered 2021-03-24 – 2021-03-25 (×2): 30 mg via INTRAVENOUS
  Filled 2021-03-23 (×4): qty 30

## 2021-03-23 MED ORDER — ENOXAPARIN SODIUM 40 MG/0.4ML IJ SOSY
40.0000 mg | PREFILLED_SYRINGE | INTRAMUSCULAR | Status: DC
  Filled 2021-03-23 (×2): qty 0.4

## 2021-03-23 MED ORDER — PRENATAL VITAMIN PLUS LOW IRON 27-1 MG PO TABS: 1.0000 | ORAL_TABLET | Freq: Every day | ORAL | Status: DC

## 2021-03-23 NOTE — Discharge Instructions (Signed)
Sickle Cell Anemia, Adult  Sickle cell anemia is a condition where your red blood cells are shaped like sickles. Red blood cells carry oxygen through the body. Sickle-shaped cells do not live as long as normal red blood cells. They also clump together and block blood from flowing through the blood vessels. This prevents the body from getting enough oxygen. Sickle cell anemia causes organ damage and pain. It also increases the risk of infection. Follow these instructions at home: Medicines  Take over-the-counter and prescription medicines only as told by your doctor.  If you were prescribed an antibiotic medicine, take it as told by your doctor. Do not stop taking the antibiotic even if you start to feel better.  If you develop a fever, do not take medicines to lower the fever right away. Tell your doctor about the fever. Managing pain, stiffness, and swelling  Try these methods to help with pain: ? Use a heating pad. ? Take a warm bath. ? Distract yourself, such as by watching TV. Eating and drinking  Drink enough fluid to keep your pee (urine) clear or pale yellow. Drink more in hot weather and during exercise.  Limit or avoid alcohol.  Eat a healthy diet. Eat plenty of fruits, vegetables, whole grains, and lean protein.  Take vitamins and supplements as told by your doctor. Traveling  When traveling, keep these with you: ? Your medical information. ? The names of your doctors. ? Your medicines.  If you need to take an airplane, talk to your doctor first. Activity  Rest often.  Avoid exercises that make your heart beat much faster, such as jogging. General instructions  Do not use products that have nicotine or tobacco, such as cigarettes and e-cigarettes. If you need help quitting, ask your doctor.  Consider wearing a medical alert bracelet.  Avoid being in high places (high altitudes), such as mountains.  Avoid very hot or cold temperatures.  Avoid places where the  temperature changes a lot.  Keep all follow-up visits as told by your doctor. This is important. Contact a doctor if:  A joint hurts.  Your feet or hands hurt or swell.  You feel tired (fatigued). Get help right away if:  You have symptoms of infection. These include: ? Fever. ? Chills. ? Being very tired. ? Irritability. ? Poor eating. ? Throwing up (vomiting).  You feel dizzy or faint.  You have new stomach pain, especially on the left side.  You have a an erection (priapism) that lasts more than 4 hours.  You have numbness in your arms or legs.  You have a hard time moving your arms or legs.  You have trouble talking.  You have pain that does not go away when you take medicine.  You are short of breath.  You are breathing fast.  You have a long-term cough.  You have pain in your chest.  You have a bad headache.  You have a stiff neck.  Your stomach looks bloated even though you did not eat much.  Your skin is pale.  You suddenly cannot see well. Summary  Sickle cell anemia is a condition where your red blood cells are shaped like sickles.  Follow your doctor's advice on ways to manage pain, food to eat, activities to do, and steps to take for safe travel.  Get medical help right away if you have any signs of infection, such as a fever. This information is not intended to replace advice given to you by   your health care provider. Make sure you discuss any questions you have with your health care provider. Document Revised: 03/13/2020 Document Reviewed: 03/13/2020 Elsevier Patient Education  2021 Elsevier Inc.  

## 2021-03-23 NOTE — H&P (Signed)
MIGNONNE AFONSO is an 30 y.o. female.   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  She came to the sickle cell day hospital today complaining of central chest pain back pain and leg pain.  Pain has persisted.  No fever or chills.  No nausea vomiting or diarrhea.  Patient has been taking her home regimen with no relief.  She is on long-acting MS Contin 60 mg every 12.  Has been taking other medicines as well including Flexeril.  Patient was therefore admitted to the sickle cell day hospital for treatment.  While in the day hospital patient received full treatment but no relief.  In the end of her stay she is being transition to inpatient care as pain has persisted.  Past Medical History:  Diagnosis Date  . Anxiety   . Headache(784.0)   . Heart murmur   . Sickle cell crisis ([place])   . Syphilis 2015   Was diagnosed and received one injection of antibiotics  . Thrombocytosis 11/22/2014     CBC Latest Ref Rng & Units 12/13/2018 12/11/2018 12/10/2018 WBC 4.0 - 10.5 K/uL 14.8(H) 13.2(H) 15.9(H) Hemoglobin 12.0 - 15.0 g/dL 8.2(L) 7.7(L) 8.1(L) Hematocrit 36.0 - 46.0 % 23.7(L) 23.2(L) 24.5(L) Platelets 150 - 400 K/uL 326 399 388      Past Surgical History:  Procedure Laterality Date  . CESAREAN SECTION N/A 02/05/2019   Procedure: CESAREAN SECTION;  Surgeon: [name], MD;  Location: [facility] LD ORS;  Service: Obstetrics;  Laterality: N/A;  . CHOLECYSTECTOMY N/A 11/30/2014   Procedure: LAPAROSCOPIC CHOLECYSTECTOMY SINGLE SITE WITH INTRAOPERATIVE CHOLANGIOGRAM;  Surgeon: Michael Boston, MD;  Location: [facility] ORS;  Service: General;  Laterality: N/A;  . SPLENECTOMY      Family History  Problem Relation Age of Onset  . Hypertension Mother   . Sickle cell anemia Sister   . Kidney disease Sister        Lupus  . Arthritis Sister   . Sickle cell anemia Sister   . Sickle cell trait Sister   . Heart disease Maternal  Aunt        CABG  . Heart disease Maternal Uncle        CABG  . Lupus Sister    Social History:  reports that she has been smoking cigarettes. She has never used smokeless tobacco. She reports that she does not drink alcohol and does not use drugs.  Allergies: No Known Allergies  Medications Prior to Admission  Medication Sig Dispense Refill  . citalopram (CELEXA) 20 MG tablet TAKE 1 TABLET BY MOUTH EVERY DAY 90 tablet 4  . cyclobenzaprine (FLEXERIL) 10 MG tablet TAKE 1 TABLET BY MOUTH TWICE A DAY (Patient taking differently: Take 10 mg by mouth daily as needed for muscle spasms.) 60 tablet 2  . folic acid (FOLVITE) 1 MG tablet Take 1 tablet (1 mg total) by mouth daily. 90 tablet 3  . morphine (MS CONTIN) 60 MG 12 hr tablet Take 1 tablet (60 mg total) by mouth every 12 (twelve) hours. 60 tablet 0  . Oxycodone HCl 10 MG TABS Take 1 tablet (10 mg total) by mouth every 6 (six) hours as needed for up to 15 days (pain). 60 tablet 0  . Prenatal Vit-Fe Fumarate-FA (PRENATAL VITAMIN PLUS LOW IRON) 27-1 MG TABS TAKE 1 TABLET BY MOUTH EVERY DAY (Patient taking differently: Take 1 tablet by mouth daily.)  30 tablet 13  . Vitamin D, Ergocalciferol, (DRISDOL) 1.25 MG (50000 UNIT) CAPS capsule TAKE ONE CAPSULE BY MOUTH ONE TIME PER WEEK (Patient taking differently: Take 50,000 Units by mouth every 7 (seven) days.) 30 capsule 1    No results found for this or any previous visit (from the past 48 hour(s)). No results found.  Review of Systems  Constitutional: Negative.   HENT: Negative.   Eyes: Negative.   Respiratory: Negative.   Cardiovascular: Negative.   Gastrointestinal: Negative.   Endocrine: Negative.   Genitourinary: Negative.   Musculoskeletal: Positive for arthralgias, back pain and myalgias.  Skin: Negative.   Allergic/Immunologic: Negative.   Neurological: Negative.   Hematological: Negative.   Psychiatric/Behavioral: Negative.     There were no vitals taken for this  visit. Physical Exam HENT:     Head: Normocephalic and atraumatic.     Right Ear: Tympanic membrane normal.     Left Ear: Tympanic membrane normal.     Nose: Nose normal.  Eyes:     Extraocular Movements: Extraocular movements intact.     Pupils: Pupils are equal, round, and reactive to light.  Cardiovascular:     Rate and Rhythm: Normal rate and regular rhythm.     Pulses: Normal pulses.     Heart sounds: Normal heart sounds.  Pulmonary:     Effort: Pulmonary effort is normal.     Breath sounds: Normal breath sounds.  Abdominal:     General: Bowel sounds are normal.     Palpations: Abdomen is soft.  Musculoskeletal:        General: Tenderness present. Normal range of motion.     Cervical back: Normal range of motion and neck supple.  Skin:    General: Skin is warm and dry.  Neurological:     General: No focal deficit present.     Mental Status: She is alert and oriented to person, place, and time.      Assessment/Plan A 30 year old female with history of sickle cell disease here with sickle cell painful crisis.  #1 sickle cell painful crisis: Patient has not done better in the sickle cell day hospital.  She will be admitted to the hospital for further evaluation and treatment.  Continue with Dilaudid PCA, Toradol, IV fluids, her long-acting pain medications  #2 anemia of chronic disease: Hemoglobin appears to be at baseline.  Admit and monitor H&H  #3 chronic pain syndrome: Continue home regimen.  #4 anxiety disorder: Continue home regimen  Zuleika Gallus,LAWAL, MD 03/23/2021, 10:26 AM

## 2021-03-23 NOTE — Progress Notes (Signed)
Patient admitted to the day infusion hospital for sickle cell pain. Initially, patient reported back pain rated 10/10. For pain management, patient placed on Dilaudid PCA, given Toradol and hydrated with IV fluids. Patient's pain remained elevated at 9/10. Dr. Jonelle Sidle assessed patient. Orders placed for admission to inpatient unit. Report called to Hannah,RN on [place]. Patient transferred to [place] in wheelchair on PCA (settings 0.5/10/3)  . Vital signs wnl. Patient alert, oriented and stable at transfer.

## 2021-03-23 NOTE — Plan of Care (Signed)

## 2021-03-23 NOTE — Telephone Encounter (Signed)
Patient called, requesting to come to the day hospital due to pain in the back rated at 10/10. Denied chest pain, fever, diarrhea, abdominal pain, nausea/vomitting. Screened negative for Covid-19 symptoms. Admitted to having means of transportation without driving self after treatment. Last took oxycodone on 05/ 21/22. Has run out of medication, awaiting authorization. Per provider, patient can come to the day hospital for treatment. Patient notified, verbalized understanding.

## 2021-03-24 ENCOUNTER — Other Ambulatory Visit: Payer: Self-pay

## 2021-03-24 LAB — CBC WITH DIFFERENTIAL/PLATELET
Abs Immature Granulocytes: 0.06 10*3/uL (ref 0.00–0.07)
Basophils Absolute: 0 10*3/uL (ref 0.0–0.1)
Basophils Relative: 0 %
Eosinophils Absolute: 0.2 10*3/uL (ref 0.0–0.5)
Eosinophils Relative: 2 %
HCT: 25.8 % — ABNORMAL LOW (ref 36.0–46.0)
Hemoglobin: 9 g/dL — ABNORMAL LOW (ref 12.0–15.0)
Immature Granulocytes: 1 %
Lymphocytes Relative: 46 %
Lymphs Abs: 5.2 10*3/uL — ABNORMAL HIGH (ref 0.7–4.0)
MCH: 30.3 pg (ref 26.0–34.0)
MCHC: 34.9 g/dL (ref 30.0–36.0)
MCV: 86.9 fL (ref 80.0–100.0)
Monocytes Absolute: 1 10*3/uL (ref 0.1–1.0)
Monocytes Relative: 9 %
Neutro Abs: 4.6 10*3/uL (ref 1.7–7.7)
Neutrophils Relative %: 42 %
Platelets: 572 10*3/uL — ABNORMAL HIGH (ref 150–400)
RBC: 2.97 MIL/uL — ABNORMAL LOW (ref 3.87–5.11)
RDW: 16.5 % — ABNORMAL HIGH (ref 11.5–15.5)
WBC: 11.1 10*3/uL — ABNORMAL HIGH (ref 4.0–10.5)
nRBC: 7.4 % — ABNORMAL HIGH (ref 0.0–0.2)

## 2021-03-24 LAB — COMPREHENSIVE METABOLIC PANEL
ALT: 18 U/L (ref 0–44)
AST: 23 U/L (ref 15–41)
Albumin: 4.1 g/dL (ref 3.5–5.0)
Alkaline Phosphatase: 61 U/L (ref 38–126)
Anion gap: 6 (ref 5–15)
BUN: 8 mg/dL (ref 6–20)
CO2: 30 mmol/L (ref 22–32)
Calcium: 9.1 mg/dL (ref 8.9–10.3)
Chloride: 101 mmol/L (ref 98–111)
Creatinine, Ser: 0.54 mg/dL (ref 0.44–1.00)
GFR, Estimated: >60 mL/min (ref >60)
Glucose, Bld: 121 mg/dL — ABNORMAL HIGH (ref 70–99)
Potassium: 3.5 mmol/L (ref 3.5–5.1)
Sodium: 137 mmol/L (ref 135–145)
Total Bilirubin: 1.3 mg/dL — ABNORMAL HIGH (ref 0.3–1.2)
Total Protein: 7.6 g/dL (ref 6.5–8.1)

## 2021-03-24 MED ORDER — ZOLPIDEM TARTRATE 5 MG PO TABS
5.0000 mg | ORAL_TABLET | Freq: Every evening | ORAL | Status: DC | PRN
  Administered 2021-03-24 – 2021-03-25 (×2): 5 mg via ORAL
  Filled 2021-03-24 (×2): qty 1

## 2021-03-24 NOTE — Plan of Care (Signed)

## 2021-03-25 ENCOUNTER — Other Ambulatory Visit: Payer: Self-pay | Admitting: Nurse Practitioner

## 2021-03-25 DIAGNOSIS — G8929 Other chronic pain: Secondary | ICD-10-CM

## 2021-03-25 DIAGNOSIS — M545 Low back pain, unspecified: Secondary | ICD-10-CM

## 2021-03-25 LAB — CBC WITH DIFFERENTIAL/PLATELET
Abs Immature Granulocytes: 0.04 10*3/uL (ref 0.00–0.07)
Basophils Absolute: 0 10*3/uL (ref 0.0–0.1)
Basophils Relative: 0 %
Eosinophils Absolute: 0.3 10*3/uL (ref 0.0–0.5)
Eosinophils Relative: 3 %
HCT: 25.1 % — ABNORMAL LOW (ref 36.0–46.0)
Hemoglobin: 8.6 g/dL — ABNORMAL LOW (ref 12.0–15.0)
Immature Granulocytes: 0 %
Lymphocytes Relative: 55 %
Lymphs Abs: 5.3 10*3/uL — ABNORMAL HIGH (ref 0.7–4.0)
MCH: 29.9 pg (ref 26.0–34.0)
MCHC: 34.3 g/dL (ref 30.0–36.0)
MCV: 87.2 fL (ref 80.0–100.0)
Monocytes Absolute: 1 10*3/uL (ref 0.1–1.0)
Monocytes Relative: 10 %
Neutro Abs: 3.1 10*3/uL (ref 1.7–7.7)
Neutrophils Relative %: 32 %
Platelets: 560 10*3/uL — ABNORMAL HIGH (ref 150–400)
RBC: 2.88 MIL/uL — ABNORMAL LOW (ref 3.87–5.11)
RDW: 17 % — ABNORMAL HIGH (ref 11.5–15.5)
WBC: 9.8 10*3/uL (ref 4.0–10.5)
nRBC: 5.4 % — ABNORMAL HIGH (ref 0.0–0.2)

## 2021-03-25 LAB — COMPREHENSIVE METABOLIC PANEL
ALT: 18 U/L (ref 0–44)
AST: 21 U/L (ref 15–41)
Albumin: 4.4 g/dL (ref 3.5–5.0)
Alkaline Phosphatase: 59 U/L (ref 38–126)
Anion gap: 8 (ref 5–15)
BUN: 9 mg/dL (ref 6–20)
CO2: 29 mmol/L (ref 22–32)
Calcium: 9.3 mg/dL (ref 8.9–10.3)
Chloride: 101 mmol/L (ref 98–111)
Creatinine, Ser: 0.41 mg/dL — ABNORMAL LOW (ref 0.44–1.00)
GFR, Estimated: >60 mL/min (ref >60)
Glucose, Bld: 104 mg/dL — ABNORMAL HIGH (ref 70–99)
Potassium: 3.9 mmol/L (ref 3.5–5.1)
Sodium: 138 mmol/L (ref 135–145)
Total Bilirubin: 0.9 mg/dL (ref 0.3–1.2)
Total Protein: 7.6 g/dL (ref 6.5–8.1)

## 2021-03-25 MED ORDER — OXYCODONE HCL 5 MG PO TABS
10.0000 mg | ORAL_TABLET | Freq: Four times a day (QID) | ORAL | Status: DC
  Administered 2021-03-25 – 2021-03-26 (×4): 10 mg via ORAL
  Filled 2021-03-25 (×4): qty 2

## 2021-03-25 MED ORDER — OXYCODONE HCL 10 MG PO TABS: 10.0000 mg | ORAL_TABLET | Freq: Four times a day (QID) | ORAL | Status: DC | PRN

## 2021-03-25 NOTE — Progress Notes (Signed)
Subjective: Patient admitted with sickle cell painful crisis.  She still complains of 9 out of 10 pain in her back and legs.  She is not getting adequate relief.  She is on her MS Contin, Dilaudid PCA, Toradol and IV fluids.  Her pain initially got better but patient said he went back up.  No fever or chills no nausea vomiting or diarrhea.  Hemoglobin has dropped from 9.8-9.00 today.  Objective: Vital signs in last 24 hours: Temp:  [97.8 F (36.6 C)-98.4 F (36.9 C)] 98 F (36.7 C) (05/24 0047) Pulse Rate:  [60-68] 68 (05/24 0047) Resp:  [14-17] 16 (05/25 0047) BP: (122-141)/(67-122) 141/78 (05/24 0047) SpO2:  [94 %-100 %] 98 % (05/24 0047) Weight change:  Last BM Date: 03/23/21  Intake/Output from previous day: 05/23 0701 - 05/240700 In: 240 [P.O.:240] Out: -  Intake/Output this shift: Total I/O In: 240 [P.O.:240] Out: -   General appearance: alert, cooperative, appears stated age and no distress Neck: no adenopathy, no carotid bruit, no JVD, supple, symmetrical, trachea midline and thyroid not enlarged, symmetric, no tenderness/mass/nodules Back: symmetric, no curvature. ROM normal. No CVA tenderness. Resp: clear to auscultation bilaterally Cardio: regular rate and rhythm, S1, S2 normal, no murmur, click, rub or gallop GI: soft, non-tender; bowel sounds normal; no masses,  no organomegaly Extremities: extremities normal, atraumatic, no cyanosis or edema Pulses: 2+ and symmetric Neurologic: Grossly normal  Lab Results: Recent Labs    03/23/21 1125 03/24/21 0457  WBC 8.2 11.1*  HGB 9.8* 9.0*  HCT 27.4* 25.8*  PLT 705* 572*   BMET Recent Labs    03/24/21 0457  NA 137  K 3.5  CL 101  CO2 30  GLUCOSE 121*  BUN 8  CREATININE 0.54  CALCIUM 9.1    Studies/Results: No results found.  Medications: I have reviewed the patient's current medications.  Assessment/Plan: 30 year old female admitted with sickle cell painful crisis.  #1 sickle cell painful crisis:  Patient is still complaining of 9 out of 10 pain.  She has no use of Dilaudid PCA as much.  We will maintain her on current regiment of the Dilaudid PCA with Toradol and IV fluids.  Continue to titrate pain medications.  Continue MS Contin.  #2 sickle cell anemia: H&H is at baseline.  She is still at 9 g.  Continue monitoring.  #3 thrombocytosis: Most likely secondary to asplenia.  Continue to monitor platelet count.  Improved with hydration.  #4 chronic pain syndrome: Patient on MS Contin from home which we will continue with.  #5 leukocytosis: Secondary to vaso-occlusive crisis.  Continue to monitor  LOS: 1 days   Carmen [name],LAWAL 03/24/2021, 12:53 AM

## 2021-03-25 NOTE — Progress Notes (Signed)
Subjective: Patient reports some improvement in her pain today.  Down to 7 out of 10.  Still in her back and legs.  No fever or chills.  No nausea vomiting or diarrhea.  White count is back to normal.  Hemoglobin dropped slightly to 8.6.  She was able to walk around the room.  Objective: Vital signs in last 24 hours: Temp:  [98 F (36.7 C)-98.4 F (36.9 C)] 98.1 F (36.7 C) (05/25 0411) Pulse Rate:  [60-68] 66 (05/25 0411) Resp:  [14-17] 16 (05/25 1242) BP: (116-141)/(67-122) 116/92 (05/25 0411) SpO2:  [95 %-100 %] 100 % (05/25 1242) Weight change:  Last BM Date: 03/23/21  Intake/Output from previous day: 05/24 0701 - 05/25 0700 In: 480 [P.O.:480] Out: -  Intake/Output this shift: No intake/output data recorded.  General appearance: alert, cooperative, appears stated age and no distress Nose: Nares normal. Septum midline. Mucosa normal. No drainage or sinus tenderness. Neck: no adenopathy, no carotid bruit, no JVD, supple, symmetrical, trachea midline and thyroid not enlarged, symmetric, no tenderness/mass/nodules Back: symmetric, no curvature. ROM normal. No CVA tenderness. Resp: clear to auscultation bilaterally Cardio: regular rate and rhythm, S1, S2 normal, no murmur, click, rub or gallop GI: soft, non-tender; bowel sounds normal; no masses,  no organomegaly Extremities: extremities normal, atraumatic, no cyanosis or edema Skin: Skin color, texture, turgor normal. No rashes or lesions  Lab Results: Recent Labs    03/24/21 0457 03/25/21 0515  WBC 11.1* 9.8  HGB 9.0* 8.6*  HCT 25.8* 25.1*  PLT 572* 560*   BMET Recent Labs    03/24/21 0457 03/25/21 0515  NA 137 138  K 3.5 3.9  CL 101 101  CO2 30 29  GLUCOSE 121* 104*  BUN 8 9  CREATININE 0.54 0.41*  CALCIUM 9.1 9.3    Studies/Results: No results found.  Medications: I have reviewed the patient's current medications.  Assessment/Plan: A 30 year old female here with sickle cell painful crisis.  #1  sickle cell painful crisis: Patient is having some improvement in her pain with current regiment. We will maintain her on current regiment of the Dilaudid PCA with Toradol and IV fluids.    Will restart her oral oxycodone 10 mg every 6 hours scheduled.  This will be incorporation for possible discharge tomorrow.  Otherwise hemodynamically stable.  Continue to titrate pain medications.  Continue MS Contin.  #2 sickle cell anemia: H&H is at baseline.  She is still at 8.6 g.  Continue monitoring.  #3 thrombocytosis: Most likely secondary to asplenia.  Continue to monitor platelet count.  Improved with hydration.  #4 chronic pain syndrome: Patient on MS Contin from home which we will continue with.  #5 leukocytosis: Secondary to vaso-occlusive crisis.  Continue to monitor   LOS: 2 days   Ashar Lewinski,LAWAL 03/25/2021, 1:19 PM

## 2021-03-25 NOTE — Plan of Care (Signed)
  Problem: Nutrition: Goal: Adequate nutrition will be maintained Outcome: Progressing   Problem: Coping: Goal: Level of anxiety will decrease Outcome: Progressing   

## 2021-03-26 ENCOUNTER — Telehealth: Payer: Self-pay | Admitting: Nurse Practitioner

## 2021-03-26 ENCOUNTER — Telehealth: Payer: Self-pay

## 2021-03-26 NOTE — Discharge Summary (Signed)
Physician Discharge Summary  Patient ID: Carmen [name] MRN: [id number] DOB/AGE: 04/18/1991 30 y.o.  Admit date: 03/23/2021 Discharge date: 03/26/2021  Admission Diagnoses:  Discharge Diagnoses:  Active Problems:   Anemia of chronic disease   Chronic musculoskeletal pain   Sickle cell pain crisis (HCC)   Sickle cell anemia with crisis (HCC)   Sickle cell disease with crisis [facility])   Discharged Condition: good  Hospital Course: Patient is a 30 year old female with history of sickle cell disease who was admitted with sickle cell painful crisis.  Patient was initially admitted to the sickle cell day hospital with pain bilateral legs.  She was evaluated and found to be in have sickle cell painful crisis.  Patient admitted to the main hospital at the end of the day due to ongoing pain that was not relieved.  She cries 19 on Dilaudid PCA, Toradol, IV D5 half-normal's and home regimen.  Patient responded to treatment and gradually get [name].  She was subsequently discharged home on her home regimen.  Patient to follow-up with her PCP.  Consults: None  Significant Diagnostic Studies: labs: Serial CBCs and CMP's were checked.  Patient did not require any blood.  PatientIV hydration and analgesia: Dilaudid  Treatments: IV hydration and analgesia: Dilaudid  Discharge Exam: Blood pressure (!) 139/94, pulse 82, temperature 98.1 F (36.7 C), temperature source Oral, resp. rate 17, last menstrual period 03/17/2021, SpO2 100 %. General appearance: alert, cooperative, appears stated age and no distress Neck: no adenopathy, no carotid bruit, no JVD, supple, symmetrical, trachea midline and thyroid not enlarged, symmetric, no tenderness/mass/nodules Back: symmetric, no curvature. ROM normal. No CVA tenderness. Resp: clear to auscultation bilaterally Cardio: regular rate and rhythm, S1, S2 normal, no murmur, click, rub or gallop GI: soft, non-tender; bowel sounds normal; no masses,  no  organomegaly Extremities: extremities normal, atraumatic, no cyanosis or edema Pulses: 2+ and symmetric Neurologic: Grossly normal  Disposition: Discharge disposition: 01-Home or Self Care       Discharge Instructions    Diet - low sodium heart healthy   Complete by: As directed    Increase activity slowly   Complete by: As directed      Allergies as of 03/26/2021   No Known Allergies     Medication List    TAKE these medications   citalopram 20 MG tablet Commonly known as: CELEXA TAKE 1 TABLET BY MOUTH EVERY DAY What changed: how much to take   cyclobenzaprine 10 MG tablet Commonly known as: FLEXERIL TAKE 1 TABLET BY MOUTH TWICE A DAY   folic acid 1 MG tablet Commonly known as: FOLVITE Take 1 tablet (1 mg total) by mouth daily.   morphine 60 MG 12 hr tablet Commonly known as: MS Contin Take 1 tablet (60 mg total) by mouth every 12 (twelve) hours.   Oxycodone HCl 10 MG Tabs Take 1 tablet (10 mg total) by mouth every 6 (six) hours as needed for up to 15 days (pain).   Prenatal Vitamin Plus Low Iron 27-1 MG Tabs TAKE 1 TABLET BY MOUTH EVERY DAY   Vitamin D (Ergocalciferol) 1.25 MG (50000 UNIT) Caps capsule Commonly known as: DRISDOL TAKE ONE CAPSULE BY MOUTH ONE TIME PER WEEK What changed:   how much to take  how to take this  when to take this  additional instructions        Signed: Tamu Golz,LAWAL 03/26/2021, 8:43 AM   Time spent 35 minutes

## 2021-03-26 NOTE — Telephone Encounter (Signed)
Prior Auth submitted through [employer], Confirmation Number: [id number] W

## 2021-03-26 NOTE — Telephone Encounter (Signed)
Morphine 60mg   Pt came into office and said Dr. Domingo Madeira just discharged her Hospital and said if we can do the [place] since Monday is a Holiday.

## 2021-03-26 NOTE — Telephone Encounter (Signed)
Prior Auth submitted, awaiting response.

## 2021-03-30 ENCOUNTER — Other Ambulatory Visit: Payer: Self-pay

## 2021-03-31 ENCOUNTER — Other Ambulatory Visit: Payer: Self-pay

## 2021-03-31 MED ORDER — OXYCODONE HCL 10 MG PO TABS: 10.0000 mg | ORAL_TABLET | Freq: Four times a day (QID) | ORAL | 0 refills | Status: DC | PRN

## 2021-04-01 NOTE — Telephone Encounter (Signed)
Confirmation #:[id number]  Benefit Plan:[employer] Health Plan:[employer] Prior Approval #:[id number] [place] Type:PHARMACY Recipient:Carmen [name] Recipient SY:[id number] O Requesting Provider Name:CRYSTAL Revonda Humphrey Requesting Provider [phone] Submission Date:03/26/2021  Status:APPROVED Effective Begin Date:03/26/2021 Effective End Date:03/21/2022  Payer:[place] [employer] DIV OF HEALTH BENEFITS# of Attachments:1PA Documents:View Documents Attachments Attachment Type Attachment Control # Transmission Code MED REC  3685  UPLOAD Back to top Line Item 1 Status:APPROVEDDrug Name/Code:MOR SUL ERDrug Code Type:DRUG NAMERequestedLength of Therapy:365 DAYSTotal RVUFCZGQ:360.165EKIYJGZQ:JSIDXFPKGYBNLW of Therapy:365 DAYSTotal HKNZUDOD:255.001UYWX to top Prior Approval Attachment

## 2021-04-03 ENCOUNTER — Telehealth: Payer: Self-pay | Admitting: Nurse Practitioner

## 2021-04-05 ENCOUNTER — Other Ambulatory Visit: Payer: Self-pay

## 2021-04-06 ENCOUNTER — Other Ambulatory Visit: Payer: Self-pay | Admitting: Nurse Practitioner

## 2021-04-06 MED ORDER — OXYCODONE HCL 10 MG PO TABS: 10.0000 mg | ORAL_TABLET | Freq: Four times a day (QID) | ORAL | 0 refills | Status: DC | PRN

## 2021-04-19 ENCOUNTER — Other Ambulatory Visit: Payer: Self-pay

## 2021-04-19 DIAGNOSIS — Z79891 Long term (current) use of opiate analgesic: Secondary | ICD-10-CM

## 2021-04-19 DIAGNOSIS — D571 Sickle-cell disease without crisis: Secondary | ICD-10-CM

## 2021-04-20 ENCOUNTER — Telehealth: Payer: Self-pay

## 2021-04-20 NOTE — Telephone Encounter (Signed)
Oxycodone 10 mg Morphine 60

## 2021-04-21 ENCOUNTER — Other Ambulatory Visit: Payer: Self-pay | Admitting: Nurse Practitioner

## 2021-04-21 ENCOUNTER — Telehealth (HOSPITAL_COMMUNITY): Payer: Self-pay

## 2021-04-21 DIAGNOSIS — Z79891 Long term (current) use of opiate analgesic: Secondary | ICD-10-CM

## 2021-04-21 DIAGNOSIS — D571 Sickle-cell disease without crisis: Secondary | ICD-10-CM

## 2021-04-21 MED ORDER — OXYCODONE HCL 10 MG PO TABS: 10.0000 mg | ORAL_TABLET | Freq: Four times a day (QID) | ORAL | 0 refills | Status: DC | PRN

## 2021-04-21 MED ORDER — MORPHINE SULFATE ER 60 MG PO TBCR: 60.0000 mg | EXTENDED_RELEASE_TABLET | Freq: Two times a day (BID) | ORAL | 0 refills | Status: DC

## 2021-04-21 NOTE — Telephone Encounter (Signed)
Pt called in requesting to be seen in day hospital for sickle cell crisis today, pt informed that per [place] FNP, center is at capacity today and pt will need to go to nearest ED if care is needed urgently or pt can call patient care center again tomorrow morning if needed. Pt verbalized understanding.

## 2021-05-02 ENCOUNTER — Other Ambulatory Visit: Payer: Self-pay

## 2021-05-06 ENCOUNTER — Telehealth: Payer: Self-pay

## 2021-05-06 NOTE — Telephone Encounter (Signed)
Oxycodone  °

## 2021-05-07 ENCOUNTER — Other Ambulatory Visit: Payer: Self-pay | Admitting: Nurse Practitioner

## 2021-05-07 MED ORDER — OXYCODONE HCL 10 MG PO TABS: 10.0000 mg | ORAL_TABLET | Freq: Four times a day (QID) | ORAL | 0 refills | Status: DC | PRN

## 2021-05-19 ENCOUNTER — Other Ambulatory Visit: Payer: Self-pay

## 2021-05-19 DIAGNOSIS — Z79891 Long term (current) use of opiate analgesic: Secondary | ICD-10-CM

## 2021-05-19 DIAGNOSIS — D571 Sickle-cell disease without crisis: Secondary | ICD-10-CM

## 2021-05-21 ENCOUNTER — Other Ambulatory Visit: Payer: Self-pay | Admitting: Nurse Practitioner

## 2021-05-21 ENCOUNTER — Emergency Department (HOSPITAL_COMMUNITY): Payer: Medicaid Other

## 2021-05-21 ENCOUNTER — Other Ambulatory Visit: Payer: Self-pay

## 2021-05-21 ENCOUNTER — Inpatient Hospital Stay (HOSPITAL_COMMUNITY)
Admission: EM | Admit: 2021-05-21 | Discharge: 2021-05-26 | DRG: 812 | Disposition: A | Discharge status: Home / Self Care | Payer: Medicaid Other | Attending: Internal Medicine | Admitting: Internal Medicine

## 2021-05-21 DIAGNOSIS — F1120 Opioid dependence, uncomplicated: Secondary | ICD-10-CM | POA: Diagnosis present

## 2021-05-21 DIAGNOSIS — G8929 Other chronic pain: Secondary | ICD-10-CM | POA: Diagnosis not present

## 2021-05-21 DIAGNOSIS — G894 Chronic pain syndrome: Secondary | ICD-10-CM | POA: Diagnosis present

## 2021-05-21 DIAGNOSIS — Z20822 Contact with and (suspected) exposure to covid-19: Secondary | ICD-10-CM | POA: Diagnosis present

## 2021-05-21 DIAGNOSIS — Z8261 Family history of arthritis: Secondary | ICD-10-CM | POA: Diagnosis not present

## 2021-05-21 DIAGNOSIS — D5700 Hb-SS disease with crisis, unspecified: Principal | ICD-10-CM | POA: Diagnosis present

## 2021-05-21 DIAGNOSIS — D571 Sickle-cell disease without crisis: Secondary | ICD-10-CM

## 2021-05-21 DIAGNOSIS — Z832 Family history of diseases of the blood and blood-forming organs and certain disorders involving the immune mechanism: Secondary | ICD-10-CM

## 2021-05-21 DIAGNOSIS — F32A Depression, unspecified: Secondary | ICD-10-CM

## 2021-05-21 DIAGNOSIS — Z79899 Other long term (current) drug therapy: Secondary | ICD-10-CM | POA: Diagnosis not present

## 2021-05-21 DIAGNOSIS — Z720 Tobacco use: Secondary | ICD-10-CM | POA: Diagnosis present

## 2021-05-21 DIAGNOSIS — F419 Anxiety disorder, unspecified: Secondary | ICD-10-CM | POA: Diagnosis present

## 2021-05-21 DIAGNOSIS — M7918 Myalgia, other site: Secondary | ICD-10-CM | POA: Diagnosis present

## 2021-05-21 DIAGNOSIS — Z8249 Family history of ischemic heart disease and other diseases of the circulatory system: Secondary | ICD-10-CM | POA: Diagnosis not present

## 2021-05-21 DIAGNOSIS — F17210 Nicotine dependence, cigarettes, uncomplicated: Secondary | ICD-10-CM | POA: Diagnosis present

## 2021-05-21 DIAGNOSIS — F329 Major depressive disorder, single episode, unspecified: Secondary | ICD-10-CM | POA: Diagnosis present

## 2021-05-21 DIAGNOSIS — Z841 Family history of disorders of kidney and ureter: Secondary | ICD-10-CM

## 2021-05-21 DIAGNOSIS — F331 Major depressive disorder, recurrent, moderate: Secondary | ICD-10-CM | POA: Diagnosis not present

## 2021-05-21 DIAGNOSIS — D638 Anemia in other chronic diseases classified elsewhere: Secondary | ICD-10-CM | POA: Diagnosis present

## 2021-05-21 DIAGNOSIS — Z79891 Long term (current) use of opiate analgesic: Secondary | ICD-10-CM

## 2021-05-21 DIAGNOSIS — F17200 Nicotine dependence, unspecified, uncomplicated: Secondary | ICD-10-CM | POA: Diagnosis present

## 2021-05-21 LAB — RESP PANEL BY RT-PCR (FLU A&B, COVID) ARPGX2
Influenza A by PCR: NEGATIVE
Influenza B by PCR: NEGATIVE
SARS Coronavirus 2 by RT PCR: NEGATIVE

## 2021-05-21 LAB — CBC WITH DIFFERENTIAL/PLATELET
Abs Immature Granulocytes: 0.04 10*3/uL (ref 0.00–0.07)
Basophils Absolute: 0.1 10*3/uL (ref 0.0–0.1)
Basophils Relative: 1 %
Eosinophils Absolute: 0.2 10*3/uL (ref 0.0–0.5)
Eosinophils Relative: 2 %
HCT: 28.3 % — ABNORMAL LOW (ref 36.0–46.0)
Hemoglobin: 9.8 g/dL — ABNORMAL LOW (ref 12.0–15.0)
Immature Granulocytes: 0 %
Lymphocytes Relative: 29 %
Lymphs Abs: 2.9 10*3/uL (ref 0.7–4.0)
MCH: 29.5 pg (ref 26.0–34.0)
MCHC: 34.6 g/dL (ref 30.0–36.0)
MCV: 85.2 fL (ref 80.0–100.0)
Monocytes Absolute: 1 10*3/uL (ref 0.1–1.0)
Monocytes Relative: 10 %
Neutro Abs: 5.7 10*3/uL (ref 1.7–7.7)
Neutrophils Relative %: 58 %
Platelets: 533 10*3/uL — ABNORMAL HIGH (ref 150–400)
RBC: 3.32 MIL/uL — ABNORMAL LOW (ref 3.87–5.11)
RDW: 13.3 % (ref 11.5–15.5)
WBC: 9.9 10*3/uL (ref 4.0–10.5)
nRBC: 0.4 % — ABNORMAL HIGH (ref 0.0–0.2)

## 2021-05-21 LAB — COMPREHENSIVE METABOLIC PANEL
ALT: 21 U/L (ref 0–44)
AST: 25 U/L (ref 15–41)
Albumin: 4.2 g/dL (ref 3.5–5.0)
Alkaline Phosphatase: 63 U/L (ref 38–126)
Anion gap: 7 (ref 5–15)
BUN: 10 mg/dL (ref 6–20)
CO2: 25 mmol/L (ref 22–32)
Calcium: 9.6 mg/dL (ref 8.9–10.3)
Chloride: 106 mmol/L (ref 98–111)
Creatinine, Ser: 0.61 mg/dL (ref 0.44–1.00)
GFR, Estimated: >60 mL/min (ref >60)
Glucose, Bld: 92 mg/dL (ref 70–99)
Potassium: 4 mmol/L (ref 3.5–5.1)
Sodium: 138 mmol/L (ref 135–145)
Total Bilirubin: 1.1 mg/dL (ref 0.3–1.2)
Total Protein: 8.1 g/dL (ref 6.5–8.1)

## 2021-05-21 LAB — RETICULOCYTES
Immature Retic Fract: 37.4 % — ABNORMAL HIGH (ref 2.3–15.9)
RBC.: 3.38 MIL/uL — ABNORMAL LOW (ref 3.87–5.11)
Retic Count, Absolute: 203.5 10*3/uL — ABNORMAL HIGH (ref 19.0–186.0)
Retic Ct Pct: 6 % — ABNORMAL HIGH (ref 0.4–3.1)

## 2021-05-21 LAB — I-STAT BETA HCG BLOOD, ED (MC, WL, AP ONLY): I-stat hCG, quantitative: <5 m[IU]/mL (ref <5)

## 2021-05-21 MED ORDER — ONDANSETRON 4 MG PO TBDP
4.0000 mg | ORAL_TABLET | Freq: Once | ORAL | Status: AC
  Administered 2021-05-21: 4 mg via ORAL
  Filled 2021-05-21: qty 1

## 2021-05-21 MED ORDER — CITALOPRAM HYDROBROMIDE 20 MG PO TABS
20.0000 mg | ORAL_TABLET | Freq: Every day | ORAL | Status: DC
  Administered 2021-05-23 – 2021-05-26 (×4): 20 mg via ORAL
  Filled 2021-05-21: qty 1
  Filled 2021-05-21: qty 2
  Filled 2021-05-21: qty 1
  Filled 2021-05-21: qty 2
  Filled 2021-05-21: qty 1

## 2021-05-21 MED ORDER — HYDROMORPHONE HCL 1 MG/ML IJ SOLN
2.0000 mg | Freq: Once | INTRAMUSCULAR | Status: AC
  Administered 2021-05-21: 2 mg via INTRAVENOUS
  Filled 2021-05-21: qty 2

## 2021-05-21 MED ORDER — HYDROMORPHONE HCL 1 MG/ML IJ SOLN
1.0000 mg | Freq: Once | INTRAMUSCULAR | Status: AC
  Administered 2021-05-21: 1 mg via INTRAVENOUS
  Filled 2021-05-21: qty 1

## 2021-05-21 MED ORDER — ONDANSETRON HCL 4 MG/2ML IJ SOLN
4.0000 mg | Freq: Once | INTRAMUSCULAR | Status: AC
  Administered 2021-05-21: 4 mg via INTRAVENOUS
  Filled 2021-05-21: qty 2

## 2021-05-21 MED ORDER — POLYETHYLENE GLYCOL 3350 17 G PO PACK: 17.0000 g | PACK | Freq: Every day | ORAL | Status: DC | PRN

## 2021-05-21 MED ORDER — ONDANSETRON HCL 4 MG PO TABS
4.0000 mg | ORAL_TABLET | ORAL | Status: DC | PRN
  Administered 2021-05-26: 4 mg via ORAL
  Filled 2021-05-21: qty 1

## 2021-05-21 MED ORDER — SODIUM CHLORIDE 0.9% FLUSH: 9.0000 mL | INTRAVENOUS | Status: DC | PRN

## 2021-05-21 MED ORDER — HYDROMORPHONE 1 MG/ML IV SOLN
INTRAVENOUS | Status: DC
  Administered 2021-05-22: 3 mg via INTRAVENOUS
  Administered 2021-05-22: 4.5 mg via INTRAVENOUS
  Administered 2021-05-22 (×2): 3.5 mg via INTRAVENOUS
  Administered 2021-05-23: 2.5 mg via INTRAVENOUS
  Administered 2021-05-23: 3 mg via INTRAVENOUS
  Filled 2021-05-21 (×2): qty 30

## 2021-05-21 MED ORDER — SODIUM CHLORIDE 0.9 % IV BOLUS
1000.0000 mL | Freq: Once | INTRAVENOUS | Status: AC
  Administered 2021-05-21: 1000 mL via INTRAVENOUS

## 2021-05-21 MED ORDER — MORPHINE SULFATE ER 60 MG PO TBCR: 60.0000 mg | EXTENDED_RELEASE_TABLET | Freq: Two times a day (BID) | ORAL | 0 refills | Status: DC

## 2021-05-21 MED ORDER — ENOXAPARIN SODIUM 40 MG/0.4ML IJ SOSY
40.0000 mg | PREFILLED_SYRINGE | INTRAMUSCULAR | Status: DC
  Filled 2021-05-21 (×3): qty 0.4

## 2021-05-21 MED ORDER — OXYCODONE-ACETAMINOPHEN 5-325 MG PO TABS
1.0000 | ORAL_TABLET | Freq: Once | ORAL | Status: AC
Start: 2021-05-21 — End: 2021-05-21
  Administered 2021-05-21: 1 via ORAL
  Filled 2021-05-21: qty 1

## 2021-05-21 MED ORDER — NICOTINE 14 MG/24HR TD PT24
14.0000 mg | MEDICATED_PATCH | Freq: Every day | TRANSDERMAL | Status: DC
  Administered 2021-05-22 – 2021-05-26 (×5): 14 mg via TRANSDERMAL
  Filled 2021-05-21 (×5): qty 1

## 2021-05-21 MED ORDER — SENNOSIDES-DOCUSATE SODIUM 8.6-50 MG PO TABS
1.0000 | ORAL_TABLET | Freq: Two times a day (BID) | ORAL | Status: DC
  Administered 2021-05-21 – 2021-05-25 (×8): 1 via ORAL
  Filled 2021-05-21 (×9): qty 1

## 2021-05-21 MED ORDER — HYDROMORPHONE HCL 1 MG/ML IJ SOLN
0.5000 mg | Freq: Once | INTRAMUSCULAR | Status: AC
  Administered 2021-05-21: 0.5 mg via SUBCUTANEOUS
  Filled 2021-05-21: qty 1

## 2021-05-21 MED ORDER — DEXTROSE-NACL 5-0.45 % IV SOLN: INTRAVENOUS | Status: AC

## 2021-05-21 MED ORDER — HYDROXYZINE HCL 25 MG PO TABS
25.0000 mg | ORAL_TABLET | ORAL | Status: DC | PRN
  Administered 2021-05-23: 50 mg via ORAL
  Administered 2021-05-24 – 2021-05-26 (×3): 25 mg via ORAL
  Filled 2021-05-21: qty 1
  Filled 2021-05-21: qty 2
  Filled 2021-05-21 (×2): qty 1

## 2021-05-21 MED ORDER — KETOROLAC TROMETHAMINE 30 MG/ML IJ SOLN
30.0000 mg | Freq: Four times a day (QID) | INTRAMUSCULAR | Status: DC
  Administered 2021-05-21 – 2021-05-25 (×14): 30 mg via INTRAVENOUS
  Filled 2021-05-21 (×14): qty 1

## 2021-05-21 MED ORDER — OXYCODONE HCL 10 MG PO TABS: 10.0000 mg | ORAL_TABLET | Freq: Four times a day (QID) | ORAL | 0 refills | Status: DC | PRN

## 2021-05-21 MED ORDER — NALOXONE HCL 0.4 MG/ML IJ SOLN: 0.4000 mg | INTRAMUSCULAR | Status: DC | PRN

## 2021-05-21 MED ORDER — MORPHINE SULFATE ER 30 MG PO TBCR
60.0000 mg | EXTENDED_RELEASE_TABLET | Freq: Two times a day (BID) | ORAL | Status: DC
Start: 2021-05-21 — End: 2021-05-26
  Administered 2021-05-21 – 2021-05-26 (×10): 60 mg via ORAL
  Filled 2021-05-21 (×5): qty 2
  Filled 2021-05-21: qty 1
  Filled 2021-05-21 (×6): qty 2

## 2021-05-21 MED ORDER — ONDANSETRON HCL 4 MG/2ML IJ SOLN: 4.0000 mg | INTRAMUSCULAR | Status: DC | PRN

## 2021-05-21 NOTE — ED Provider Notes (Signed)
[facility] EMERGENCY DEPARTMENT Provider Note   CSN: [id number] Arrival date & time: 05/21/21  0841     History Chief Complaint  Patient presents with   Sickle Cell Pain Crisis    Carmen [name] is a 30 y.o. female.  Patient complains of upper and lower back pain and pain in her extremities.  This is typical for her sickle crisis  The history is provided by the patient and medical records. No language interpreter was used.  Sickle Cell Pain Crisis Location:  Back Severity:  Moderate Onset quality:  Gradual Duration:  2 days Similar to previous crisis episodes: yes   Timing:  Constant Progression:  Worsening Chronicity:  Recurrent Sickle cell genotype:  SS Context: not alcohol consumption   Relieved by:  Nothing Worsened by:  Nothing Associated symptoms: no chest pain, no congestion, no cough, no fatigue and no headaches       Past Medical History:  Diagnosis Date   Anxiety    Headache(784.0)    Heart murmur    Sickle cell crisis ([place])    Syphilis 2015   Was diagnosed and received one injection of antibiotics   Thrombocytosis 11/22/2014     CBC Latest Ref Rng & Units 12/13/2018 12/11/2018 12/10/2018 WBC 4.0 - 10.5 K/uL 14.8(H) 13.2(H) 15.9(H) Hemoglobin 12.0 - 15.0 g/dL 8.2(L) 7.7(L) 8.1(L) Hematocrit 36.0 - 46.0 % 23.7(L) 23.2(L) 24.5(L) Platelets 150 - 400 K/uL 326 399 388      Patient Active Problem List   Diagnosis Date Noted   Sickle cell disease with crisis ([place]) 03/23/2021   Acute pain of right shoulder    Sickle cell anemia with crisis ([place]) 02/16/2021   Acute chest syndrome ([place]) 01/18/2021   Community acquired pneumonia of right lower lobe of lung    Fever of unknown origin 10/14/2020   Sickle cell crisis ([place]) 03/31/2020   Bacterial vaginitis 01/04/2020   Acute cystitis without hematuria    Dysuria 12/31/2019   Urine leukocytes    Sickle cell pain crisis ([place]) 09/10/2019   Sickle cell disease ([place]) 12/04/2018   Alpha thalassemia  silent carrier 12/04/2018   Chronic prescription opiate use 03/13/2018   Chronic musculoskeletal pain 03/13/2018   Vitamin D deficiency 01/13/2018   Leukocytosis 08/21/2017   Hypokalemia 06/27/2017   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[place]) 04/05/2017   Hb-SS disease without crisis ([place]) 08/15/2016   Anemia of chronic disease    Chest pain 35/46/5681   Systolic ejection murmur 27/51/7001   Sickle cell anemia of mother during pregnancy ([place]) 05/16/2014    Past Surgical History:  Procedure Laterality Date   CESAREAN SECTION N/A 02/05/2019   Procedure: CESAREAN SECTION;  Surgeon: [name], MD;  Location: [facility] LD ORS;  Service: Obstetrics;  Laterality: N/A;   CHOLECYSTECTOMY N/A 11/30/2014   Procedure: LAPAROSCOPIC CHOLECYSTECTOMY SINGLE SITE WITH INTRAOPERATIVE CHOLANGIOGRAM;  Surgeon: Michael Boston, MD;  Location: [facility] ORS;  Service: General;  Laterality: N/A;   SPLENECTOMY       OB History     Gravida  2   Para  1   Term  1   Preterm      AB  1   Living  1      SAB      IAB  1   Ectopic      Multiple  0   Live Births  1           Family History  Problem Relation Age of  Onset   Hypertension Mother    Sickle cell anemia Sister    Kidney disease Sister        Lupus   Arthritis Sister    Sickle cell anemia Sister    Sickle cell trait Sister    Heart disease Maternal Aunt        CABG   Heart disease Maternal Uncle        CABG   Lupus Sister     Social History   Tobacco Use   Smoking status: Some Days    Types: Cigarettes   Smokeless tobacco: Never  Vaping Use   Vaping Use: Never used  Substance Use Topics   Alcohol use: No   Drug use: No    Home Medications Prior to Admission medications   Medication Sig Start Date End Date Taking? Authorizing Provider  citalopram (CELEXA) 20 MG tablet TAKE 1 TABLET BY MOUTH EVERY DAY Patient taking differently: Take by mouth daily. 02/24/21  Yes [name], FNP  cyclobenzaprine (FLEXERIL)  10 MG tablet TAKE 1 TABLET BY MOUTH TWICE A DAY Patient taking differently: Take 10 mg by mouth in the morning and at bedtime. 03/25/21  Yes Vevelyn Francois, NP  folic acid (FOLVITE) 1 MG tablet Take 1 tablet (1 mg total) by mouth daily. 03/15/19  Yes Lanae Boast, FNP  morphine (MS CONTIN) 60 MG 12 hr tablet Take 1 tablet (60 mg total) by mouth every 12 (twelve) hours. 05/25/21 06/24/21 Yes Vevelyn Francois, NP  Oxycodone HCl 10 MG TABS Take 1 tablet (10 mg total) by mouth every 6 (six) hours as needed for up to 15 days (pain). Patient taking differently: Take 10 mg by mouth in the morning and at bedtime. 05/22/21 06/06/21 Yes Vevelyn Francois, NP  Prenatal Vit-Fe Fumarate-FA (PRENATAL VITAMIN PLUS LOW IRON) 27-1 MG TABS TAKE 1 TABLET BY MOUTH EVERY DAY Patient taking differently: Take 1 tablet by mouth daily. 03/03/20  Yes Shelly Bombard, MD  Vitamin D, Ergocalciferol, (DRISDOL) 1.25 MG (50000 UNIT) CAPS capsule TAKE ONE CAPSULE BY MOUTH ONE TIME PER WEEK Patient taking differently: Take 50,000 Units by mouth every 7 (seven) days. 09/03/20  Yes Vevelyn Francois, NP    Allergies    Patient has no known allergies.  Review of Systems   Review of Systems  Constitutional:  Negative for appetite change and fatigue.  HENT:  Negative for congestion, ear discharge and sinus pressure.   Eyes:  Negative for discharge.  Respiratory:  Negative for cough.   Cardiovascular:  Negative for chest pain.  Gastrointestinal:  Negative for abdominal pain and diarrhea.  Genitourinary:  Negative for frequency and hematuria.  Musculoskeletal:  Positive for back pain.       Upper and lower back pain and pain in her lower extremities  Skin:  Negative for rash.  Neurological:  Negative for seizures and headaches.  Psychiatric/Behavioral:  Negative for hallucinations.    Physical Exam Updated Vital Signs BP 118/67   Pulse 70   Temp 98.8 F (37.1 C) (Oral)   Resp 12   LMP 05/06/2021 (Exact Date)   SpO2 100%    Physical Exam Vitals and nursing note reviewed.  Constitutional:      General: She is in acute distress.     Appearance: She is well-developed. She is ill-appearing.  HENT:     Head: Normocephalic.  Eyes:     General: No scleral icterus.    Conjunctiva/sclera: Conjunctivae normal.  Neck:  Thyroid: No thyromegaly.  Cardiovascular:     Rate and Rhythm: Normal rate and regular rhythm.     Heart sounds: No murmur heard.   No friction rub. No gallop.  Pulmonary:     Breath sounds: No stridor. No wheezing or rales.  Chest:     Chest wall: No tenderness.  Abdominal:     General: There is no distension.     Tenderness: There is no abdominal tenderness. There is no rebound.  Musculoskeletal:        General: Normal range of motion.     Cervical back: Neck supple.  Lymphadenopathy:     Cervical: No cervical adenopathy.  Skin:    Findings: No erythema or rash.  Neurological:     Mental Status: She is alert and oriented to person, place, and time.     Motor: No abnormal muscle tone.     Coordination: Coordination normal.  Psychiatric:        Behavior: Behavior normal.    ED Results / Procedures / Treatments   Labs (all labs ordered are listed, but only abnormal results are displayed) Labs Reviewed  CBC WITH DIFFERENTIAL/PLATELET - Abnormal; Notable for the following components:      Result Value   RBC 3.32 (*)    Hemoglobin 9.8 (*)    HCT 28.3 (*)    Platelets 533 (*)    nRBC 0.4 (*)    All other components within normal limits  RETICULOCYTES - Abnormal; Notable for the following components:   Retic Ct Pct 6.0 (*)    RBC. 3.38 (*)    Retic Count, Absolute 203.5 (*)    Immature Retic Fract 37.4 (*)    All other components within normal limits  RESP PANEL BY RT-PCR (FLU A&B, COVID) ARPGX2  COMPREHENSIVE METABOLIC PANEL  I-STAT BETA HCG BLOOD, ED ([facility], [facility], [facility] ONLY)    EKG None  Radiology DG Chest Port 1 View  Result Date: 05/21/2021 CLINICAL DATA:  Pain.   Sickle cell pain crisis. EXAM: PORTABLE CHEST 1 VIEW COMPARISON:  Radiograph 01/18/2021 FINDINGS: Upper normal heart size likely in part accentuated by technique.The cardiomediastinal contours are normal. The lungs are clear. Pulmonary vasculature is normal. No consolidation, pleural effusion, or pneumothorax. No acute osseous abnormalities are seen. IMPRESSION: No acute chest findings. Electronically Signed   By: Keith Rake M.D.   On: 05/21/2021 17:16    Procedures Procedures   Medications Ordered in ED Medications  HYDROmorphone (DILAUDID) injection 1 mg (has no administration in time range)  HYDROmorphone (DILAUDID) injection 0.5 mg (0.5 mg Subcutaneous Given 05/21/21 1026)  ondansetron (ZOFRAN-ODT) disintegrating tablet 4 mg (4 mg Oral Given 05/21/21 1024)  oxyCODONE-acetaminophen (PERCOCET/ROXICET) 5-325 MG per tablet 1 tablet (1 tablet Oral Given 05/21/21 1330)  sodium chloride 0.9 % bolus 1,000 mL (0 mLs Intravenous Stopped 05/21/21 1858)  HYDROmorphone (DILAUDID) injection 1 mg (1 mg Intravenous Given 05/21/21 1757)  ondansetron (ZOFRAN) injection 4 mg (4 mg Intravenous Given 05/21/21 1756)  HYDROmorphone (DILAUDID) injection 2 mg (2 mg Intravenous Given 05/21/21 1934)    ED Course  I have reviewed the triage vital signs and the nursing notes.  Pertinent labs & imaging results that were available during my care of the patient were reviewed by me and considered in my medical decision making (see chart for details). CRITICAL CARE Performed by: Milton Ferguson Total critical care time: 35 minutes Critical care time was exclusive of separately billable procedures and treating other patients. Critical care was necessary  to treat or prevent imminent or life-threatening deterioration. Critical care was time spent personally by me on the following activities: development of treatment plan with patient and/or surrogate as well as nursing, discussions with consultants, evaluation of patient's  response to treatment, examination of patient, obtaining history from patient or surrogate, ordering and performing treatments and interventions, ordering and review of laboratory studies, ordering and review of radiographic studies, pulse oximetry and re-evaluation of patient's condition.    MDM Rules/Calculators/A&P                           Patient did not improve with pain medicine she will be admitted for sickle cell crisis Final Clinical Impression(s) / ED Diagnoses Final diagnoses:  None    Rx / DC Orders ED Discharge Orders     None        Milton Ferguson, MD 05/25/21 1109

## 2021-05-21 NOTE — H&P (Signed)
Carmen [name]:[id number] DOB: Jul 04, 1991 DOA: 05/21/2021     PCP: Vevelyn Francois, NP   Outpatient Specialists:   Hematology [facility]  Patient arrived to ER on 05/21/21 at 0841 Referred by Attending Milton Ferguson, MD   Patient coming from: home Lives  With family    Chief Complaint:   Chief Complaint  Patient presents with   Sickle Cell Pain Crisis    HPI: Carmen [name] is a 30 y.o. female with medical history significant of sickle cell    Presented with  pain in back and legs and arms versus her sickle cell typical pain.  She has been having symptoms of the past 2 days try to go to sickle cell pain clinic but it was closed she presented to [facility] because it was closer to her house.  Pain has been getting progressively worse she tried to use her medications and that seemed to help.  No fevers no chills no shortness of breath no cough. Last admission for sickle cell pain was in May 2022 Home regimen supposed to be morphine 60 mg every 12 hours  ussually only takes once a day and oxycodone 10 mg every 4 hours she is supposed to take folic acid as well  Has   been vaccinated against COVID    Initial COVID TEST   in house  PCR testing  Pending  Lab Results  Component Value Date   SARSCOV2NAA NEGATIVE 03/11/2021   [place] NEGATIVE 02/17/2021   [place] NEGATIVE 01/18/2021   [place] NEGATIVE 10/13/2020     Regarding pertinent Chronic problems:     obesity-   BMI Readings from Last 1 Encounters:  03/13/21 30.85 kg/m      Chronic anemia - baseline hg Hemoglobin & Hematocrit  Recent Labs    03/24/21 0457 03/25/21 0515 05/21/21 1018  HGB 9.0* 8.6* 9.8*   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     ED Triage Vitals  Enc Vitals Group     BP 05/21/21 1013 (!) 127/58     Pulse Rate 05/21/21 1013 66     Resp 05/21/21 1013 16     Temp 05/21/21 1013 98.1 F (36.7 C)      Temp Source 05/21/21 1013 Oral     SpO2 05/21/21 1013 99 %     Weight --      Height --      Head Circumference --      Peak Flow --      Pain Score 05/21/21 1845 9     Pain Loc --      Pain Edu? --      Excl. in [place]? --   TMAX(24)@     _________________________________________ Significant initial  Findings: Abnormal Labs Reviewed  CBC WITH DIFFERENTIAL/PLATELET - Abnormal; Notable for the following components:      Result Value   RBC 3.32 (*)    Hemoglobin 9.8 (*)    HCT 28.3 (*)    Platelets 533 (*)    nRBC 0.4 (*)    All other components within normal limits  RETICULOCYTES - Abnormal; Notable for the following components:   Retic Ct Pct 6.0 (*)    RBC. 3.38 (*)    Retic Count, Absolute 203.5 (*)    Immature Retic Fract 37.4 (*)    All other components within normal limits   ____________________________________________ Ordered    CXR -  NON acute      ECG: Ordered Personally reviewed by me showing: HR :  75 Rhythm:  NSR,     no evidence of ischemic changes QTC 449   The recent clinical data is shown below. Vitals:   05/21/21 1945 05/21/21 2000 05/21/21 2015 05/21/21 2030  BP: 108/70 105/64 101/65 118/67  Pulse: 81 72 63 70  Resp: 18 17 12 12   Temp:      TempSrc:      SpO2: 100% 100% 100% 100%      WBC     Component Value Date/Time   WBC 9.9 05/21/2021 1018   LYMPHSABS 2.9 05/21/2021 1018   LYMPHSABS 3.5 (H) 07/02/2020 1040   MONOABS 1.0 05/21/2021 1018   EOSABS 0.2 05/21/2021 1018   EOSABS 0.2 07/02/2020 1040   BASOSABS 0.1 05/21/2021 1018   BASOSABS 0.1 07/02/2020 1040      _______________________________________________ Hospitalist was called for admission for sickle cell pain crisis  The following Work up has been ordered so far:  Orders Placed This Encounter  Procedures   DG Chest Port 1 View   Comprehensive metabolic panel   CBC with Differential   Reticulocytes   Monitor O2 SATs   Document Actual / Estimated Weight   If O2  Sat <94% administer O2 at 2 liters/minute via nasal cannula   Consult to hospitalist   I-Stat beta hCG blood, ED   EKG 12-Lead   EKG 12-Lead     Following Medications were ordered in ER: Medications  HYDROmorphone (DILAUDID) injection 1 mg (has no administration in time range)  HYDROmorphone (DILAUDID) injection 0.5 mg (0.5 mg Subcutaneous Given 05/21/21 1026)  ondansetron (ZOFRAN-ODT) disintegrating tablet 4 mg (4 mg Oral Given 05/21/21 1024)  oxyCODONE-acetaminophen (PERCOCET/ROXICET) 5-325 MG per tablet 1 tablet (1 tablet Oral Given 05/21/21 1330)  sodium chloride 0.9 % bolus 1,000 mL (0 mLs Intravenous Stopped 05/21/21 1858)  HYDROmorphone (DILAUDID) injection 1 mg (1 mg Intravenous Given 05/21/21 1757)  ondansetron (ZOFRAN) injection 4 mg (4 mg Intravenous Given 05/21/21 1756)  HYDROmorphone (DILAUDID) injection 2 mg (2 mg Intravenous Given 05/21/21 1934)        Consult Orders  (From admission, onward)           Start     Ordered   05/21/21 2037  Consult to hospitalist  Py by Remo Lipps  Once       Provider:  (Not yet assigned)  Question Answer Comment  Place call to: [facility],   call [phone]   Reason for Consult Admit      05/21/21 2036              OTHER Significant initial  Findings:  labs showing:    Recent Labs  Lab 05/21/21 1018  NA 138  K 4.0  CO2 25  GLUCOSE 92  BUN 10  CREATININE 0.61  CALCIUM 9.6    Cr   stable,    Lab Results  Component Value Date   CREATININE 0.61 05/21/2021   CREATININE 0.41 (L) 03/25/2021   CREATININE 0.54 03/24/2021    Recent Labs  Lab 05/21/21 1018  AST 25  ALT 21  ALKPHOS 63  BILITOT 1.1  PROT 8.1  ALBUMIN 4.2   Lab Results  Component Value Date   CALCIUM 9.6 05/21/2021   PHOS 4.4 04/01/2020        Plt: Lab Results  Component Value Date   PLT 533 (H) 05/21/2021    Recent Labs  Lab 05/21/21  1018  WBC 9.9  NEUTROABS 5.7  HGB 9.8*  HCT 28.3*  MCV 85.2  PLT 533*    HG/HCT   stable,      Component Value Date/Time   HGB 9.8 (L) 05/21/2021 1018   HGB 9.8 (L) 07/02/2020 1040   HCT 28.3 (L) 05/21/2021 1018   HCT 28.9 (L) 07/02/2020 1040   MCV 85.2 05/21/2021 1018   MCV 88 07/02/2020 1040    Cultures:    Component Value Date/Time   SDES  10/14/2020 1157    BLOOD LEFT ARM Performed at [facility], [place] [address], [place], [place] [address]    SPECREQUEST  10/14/2020 1157    BOTTLES DRAWN AEROBIC AND ANAEROBIC Blood Culture adequate volume Performed at [facility] [address] [place], [place] [address]    CULT  10/14/2020 1157    NO GROWTH 5 DAYS Performed at [facility] [address], [place], [place] [address]    REPTSTATUS 10/19/2020 FINAL 10/14/2020 1157     Radiological Exams on Admission: DG Chest Port 1 View  Result Date: 05/21/2021 CLINICAL DATA:  Pain.  Sickle cell pain crisis. EXAM: PORTABLE CHEST 1 VIEW COMPARISON:  Radiograph 01/18/2021 FINDINGS: Upper normal heart size likely in part accentuated by technique.The cardiomediastinal contours are normal. The lungs are clear. Pulmonary vasculature is normal. No consolidation, pleural effusion, or pneumothorax. No acute osseous abnormalities are seen. IMPRESSION: No acute chest findings. Electronically Signed   By: Keith Rake M.D.   On: 05/21/2021 17:16   _______________________________________________________________________________________________________ Latest  Blood pressure 118/67, pulse 70, temperature 98.8 F (37.1 C), temperature source Oral, resp. rate 12, last menstrual period 05/06/2021, SpO2 100 %.   Review of Systems:    Pertinent positives include: pain back and ext  Constitutional:  No weight loss, night sweats, Fevers, chills, fatigue, weight loss  HEENT:  No headaches, Difficulty swallowing,Tooth/dental problems,Sore throat,  No sneezing, itching, ear ache, nasal congestion, post nasal drip,  Cardio-vascular:  No chest  pain, Orthopnea, PND, anasarca, dizziness, palpitations.no Bilateral lower extremity swelling  GI:  No heartburn, indigestion, abdominal pain, nausea, vomiting, diarrhea, change in bowel habits, loss of appetite, melena, blood in stool, hematemesis Resp:  no shortness of breath at rest. No dyspnea on exertion, No excess mucus, no productive cough, No non-productive cough, No coughing up of blood.No change in color of mucus.No wheezing. Skin:  no rash or lesions. No jaundice GU:  no dysuria, change in color of urine, no urgency or frequency. No straining to urinate.  No flank pain.  Musculoskeletal:  No joint pain or no joint swelling. No decreased range of motion.   Psych:  No change in mood or affect. No depression or anxiety. No memory loss.  Neuro: no localizing neurological complaints, no tingling, no weakness, no double vision, no gait abnormality, no slurred speech, no confusion  All systems reviewed and apart from [place] all are negative _______________________________________________________________________________________________ Past Medical History:   Past Medical History:  Diagnosis Date   Anxiety    Headache(784.0)    Heart murmur    Sickle cell crisis ([place])    Syphilis 2015   Was diagnosed and received one injection of antibiotics   Thrombocytosis 11/22/2014     CBC Latest Ref Rng & Units 12/13/2018 12/11/2018 12/10/2018 WBC 4.0 - 10.5 K/uL 14.8(H) 13.2(H) 15.9(H) Hemoglobin 12.0 - 15.0 g/dL 8.2(L) 7.7(L) 8.1(L) Hematocrit 36.0 - 46.0 % 23.7(L) 23.2(L) 24.5(L) Platelets 150 - 400 K/uL 326 399 388  Past Surgical History:  Procedure Laterality Date   CESAREAN SECTION N/A 02/05/2019   Procedure: CESAREAN SECTION;  Surgeon: [name], MD;  Location: [facility] LD ORS;  Service: Obstetrics;  Laterality: N/A;   CHOLECYSTECTOMY N/A 11/30/2014   Procedure: LAPAROSCOPIC CHOLECYSTECTOMY SINGLE SITE WITH INTRAOPERATIVE CHOLANGIOGRAM;  Surgeon: Michael Boston, MD;  Location: [facility] ORS;   Service: General;  Laterality: N/A;   SPLENECTOMY      Social History:  Ambulatory  independently         reports that she has been smoking cigarettes. She has never used smokeless tobacco. She reports that she does not drink alcohol and does not use drugs.    Family History:   Family History  Problem Relation Age of Onset   Hypertension Mother    Sickle cell anemia Sister    Kidney disease Sister        Lupus   Arthritis Sister    Sickle cell anemia Sister    Sickle cell trait Sister    Heart disease Maternal Aunt        CABG   Heart disease Maternal Uncle        CABG   Lupus Sister    ______________________________________________________________________________________________ Allergies: No Known Allergies   Prior to Admission medications   Medication Sig Start Date End Date Taking? Authorizing Provider  citalopram (CELEXA) 20 MG tablet TAKE 1 TABLET BY MOUTH EVERY DAY Patient taking differently: Take by mouth daily. 02/24/21  Yes [name], FNP  cyclobenzaprine (FLEXERIL) 10 MG tablet TAKE 1 TABLET BY MOUTH TWICE A DAY Patient taking differently: Take 10 mg by mouth in the morning and at bedtime. 03/25/21  Yes Vevelyn Francois, NP  folic acid (FOLVITE) 1 MG tablet Take 1 tablet (1 mg total) by mouth daily. 03/15/19  Yes Lanae Boast, FNP  morphine (MS CONTIN) 60 MG 12 hr tablet Take 1 tablet (60 mg total) by mouth every 12 (twelve) hours. 05/25/21 06/24/21 Yes Vevelyn Francois, NP  Oxycodone HCl 10 MG TABS Take 1 tablet (10 mg total) by mouth every 6 (six) hours as needed for up to 15 days (pain). Patient taking differently: Take 10 mg by mouth in the morning and at bedtime. 05/22/21 06/06/21 Yes Vevelyn Francois, NP  Prenatal Vit-Fe Fumarate-FA (PRENATAL VITAMIN PLUS LOW IRON) 27-1 MG TABS TAKE 1 TABLET BY MOUTH EVERY DAY Patient taking differently: Take 1 tablet by mouth daily. 03/03/20  Yes Shelly Bombard, MD  Vitamin D, Ergocalciferol, (DRISDOL) 1.25 MG (50000  UNIT) CAPS capsule TAKE ONE CAPSULE BY MOUTH ONE TIME PER WEEK Patient taking differently: Take 50,000 Units by mouth every 7 (seven) days. 09/03/20  Yes Vevelyn Francois, NP    ___________________________________________________________________________________________________ Physical Exam: Vitals with BMI 05/21/2021 05/21/2021 05/21/2021  Height - - -  Weight - - -  BMI - - -  Systolic 604 540 981  Diastolic 67 65 64  Pulse 70 63 72  Some encounter information is confidential and restricted. Go to Review Flowsheets activity to see all data.     1. General:  in No  Acute distress    Chronically ill -appearing 2. Psychological: Alert and  Oriented 3. Head/ENT:    Dry Mucous Membranes                          Head Non traumatic, neck supple  Normal  Dentition 4. SKIN:  decreased Skin turgor,  Skin clean Dry and intact no rash 5. Heart: Regular rate and rhythm no  Murmur, no Rub or gallop 6. Lungs:   no wheezes or crackles   7. Abdomen: Soft,  non-tender, Non distended   obese  bowel sounds present 8. Lower extremities: no clubbing, cyanosis, no  edema 9. Neurologically Grossly intact, moving all 4 extremities equally   10. MSK: Normal range of motion    Chart has been reviewed  ______________________________________________________________________________________________  Assessment/Plan 30 y.o. female with medical history significant of sickle cell    Admitted for sickle cell crisis  Present on Admission:  Sickle cell anemia with crisis ([place]) - - will admit per sickle cell protocol,    control pain,    hydrate with IVF D5 .45% Saline @ 100 mls/hour,    Weight based Dilaudid PCA for opioid tolerant patients.    continue  folic acid   Transfuse as needed if Hg drops significantly below baseline.    No evidence of acute chest at this time   Sickle cell team to take over management in AM    Other plan as per orders.  DVT prophylaxis:   Lovenox        Code Status:    Code Status: Prior FULL CODE  as per patient   I had personally discussed CODE STATUS with patient     Family Communication:   Family not at  Bedside    Disposition Plan:    To home once workup is complete and patient is stable   Following barriers for discharge:                                                         Pain controlled with PO medications                                  Consults called: none    Admission status:  ED Disposition     ED Disposition  Admit   Condition  --   [place]: [place] [[id number]]  Level of Care: Med-Surg [16]  May admit patient to [facility] or [facility] if equivalent level of care is available:: No  Covid Evaluation: Asymptomatic Screening Protocol (No Symptoms)  Diagnosis: Sickle cell anemia with crisis [facility]) [[id number]]  Admitting Physician: Toy Baker [[id number]]  Attending Physician: Toy Baker [[id number]]  Estimated length of stay: past midnight tomorrow  Certification:: I certify this patient will need inpatient services for at least 2 midnights           inpatient     I Expect 2 midnight stay secondary to severity of patient's current illness need for inpatient interventions justified by the following:     and extensive comorbidities including:  Chronic pain  Sickle cell  That are currently affecting medical management.   I expect  patient to be hospitalized for 2 midnights requiring inpatient medical care.  Patient is at high risk for adverse outcome (such as loss of life or disability) if not treated.  Indication for inpatient stay as follows:    severe pain requiring acute inpatient management,     Need for  IV fluids,  IV pain medications,      Level of care        medical floor        Lab Results  Component Value Date   [place] 03/11/2021     Precautions: admitted as    asymptomatic screening protocol    PPE: Used by the  provider:   N95  eye Goggles,  Gloves    Valetta Mulroy 05/21/2021, 9:55 PM    [facility]     after 2 AM please page floor coverage PA If 7AM-7PM, please contact the day team taking care of the patient using [email or web address]   Patient was evaluated in the context of the global COVID-19 pandemic, which necessitated consideration that the patient might be at risk for infection with the SARS-CoV-2 virus that causes COVID-19. Institutional protocols and algorithms that pertain to the evaluation of patients at risk for COVID-19 are in a state of rapid change based on information released by regulatory bodies including the CDC and federal and state organizations. These policies and algorithms were followed during the patient's care.

## 2021-05-21 NOTE — ED Triage Notes (Signed)
Pt here POV with c/o of sickle cell crisis. Pt taking meds at home with no relief. Pain in back and arms. 10/10

## 2021-05-21 NOTE — ED Provider Notes (Signed)
Emergency Medicine Provider Triage Evaluation Note  Carmen [name] , a 30 y.o. female  was evaluated in triage.  Pt complains of sickle cell crisis pain.  Patient states that.  She states that her back pain to bilateral upper arms.  Patient states that this is her normal pattern for sickle cell crisis pain.  Patient reports that she has had pain over the last 2 days.  Pain is gone progressively worse over this time.  Patient has had no relief at home with prescribed pain medications.  Patient denies any fevers, chills or recent illness.  Patient states that he is a trigger for her sickle cell pain crisis.  Patient denies any recent falls or injuries.  Patient has not had any of her morning.  Review of Systems  Positive: Back pain, myalgias Negative: Fever, chills, shortness of breath, chest pain  Physical Exam  BP (!) 127/58   Pulse 66   Temp 98.1 F (36.7 C) (Oral)   Resp 16   SpO2 99%  Gen:   Awake, no distress   Resp:  Normal effort  MSK:   Moves extremities without difficulty  Other:  No midline tenderness or deformity to cervical, thoracic, lumbar spine.  No bony tenderness or deformity noted to bilateral forearms.  Medical Decision Making  Medically screening exam initiated at 10:19 AM.  Appropriate orders placed.  Carmen [name] was informed that the remainder of the evaluation will be completed by another provider, this initial triage assessment does not replace that evaluation, and the importance of remaining in the ED until their evaluation is complete.  The patient appears stable so that the remainder of the work up may be completed by another provider.      Loni Beckwith, PA-C 05/21/21 1021    Tegeler, Gwenyth Allegra, MD 05/21/21 1053

## 2021-05-22 ENCOUNTER — Other Ambulatory Visit: Payer: Self-pay

## 2021-05-22 DIAGNOSIS — F32A Depression, unspecified: Secondary | ICD-10-CM

## 2021-05-22 DIAGNOSIS — D5700 Hb-SS disease with crisis, unspecified: Principal | ICD-10-CM

## 2021-05-22 DIAGNOSIS — F329 Major depressive disorder, single episode, unspecified: Secondary | ICD-10-CM

## 2021-05-22 LAB — PHOSPHORUS: Phosphorus: 3.6 mg/dL (ref 2.5–4.6)

## 2021-05-22 LAB — MAGNESIUM: Magnesium: 1.8 mg/dL (ref 1.7–2.4)

## 2021-05-22 NOTE — ED Notes (Signed)
Blood draw unsuccessful 

## 2021-05-22 NOTE — ED Notes (Signed)
Pt transferred to hospital bed for comfort.

## 2021-05-22 NOTE — ED Notes (Signed)
Pt requesting pain medication.  

## 2021-05-22 NOTE — ED Notes (Signed)
Pt ambulatory to restroom without assistance.  States she slept good but still has pain.

## 2021-05-22 NOTE — ED Notes (Signed)
Pt repeatedly removes BP cuff.  Replaced and vitals rechecked

## 2021-05-22 NOTE — ED Notes (Signed)
Found pt to have removed BP cuff again.  Replaced to right arm

## 2021-05-22 NOTE — Progress Notes (Addendum)
Subjective: Carmen [name] is a 30 year old female with a medical history significant for sickle cell disease, chronic pain syndrome, opiate dependence and tolerance, history of anemia of chronic disease, and history of depression that was admitted for sickle cell pain crisis.  Patient states that pain intensity increased 2 days ago after going swimming with her family.  She says that pain has been unrelieved by her home medications.  Patient's pain improved overnight with IV Dilaudid PCA, she rates her pain a 6/10.  She denies any headache, chest pain, shortness of breath, urinary symptoms, nausea, vomiting, or diarrhea.  Objective:  Vital signs in last 24 hours:  Vitals:   05/22/21 0800 05/22/21 0901 05/22/21 1100 05/22/21 1202  BP: 102/64 112/68 106/79   Pulse: (!) 57 70 (!) 50   Resp: '12 16 11 16  '$ Temp:      TempSrc:      SpO2: 100% 97% 100% 100%  Weight:      Height:        Intake/Output from previous day:   Intake/Output Summary (Last 24 hours) at 05/22/2021 1316 Last data filed at 05/21/2021 1858 Gross per 24 hour  Intake 1000 ml  Output --  Net 1000 ml    Physical Exam: General: Alert, awake, oriented x3, in no acute distress.  HEENT: [place]/AT PEERL, EOMI Neck: Trachea midline,  no masses, no thyromegal,y no JVD, no carotid bruit OROPHARYNX:  Moist, No exudate/ erythema/lesions.  Heart: Regular rate and rhythm, without murmurs, rubs, gallops, PMI non-displaced, no heaves or thrills on palpation.  Lungs: Clear to auscultation, no wheezing or rhonchi noted. No increased vocal fremitus resonant to percussion  Abdomen: Soft, nontender, nondistended, positive bowel sounds, no masses no hepatosplenomegaly noted..  Neuro: No focal neurological deficits noted cranial nerves II through XII grossly intact. DTRs 2+ bilaterally upper and lower extremities. Strength 5 out of 5 in bilateral upper and lower extremities. Musculoskeletal: No warm swelling or erythema around joints, no spinal  tenderness noted. Psychiatric: Patient alert and oriented x3, good insight and cognition, good recent to remote recall. Lymph node survey: No cervical axillary or inguinal lymphadenopathy noted.  Lab Results:  Basic Metabolic Panel:    Component Value Date/Time   NA 138 05/21/2021 1018   NA 142 07/02/2020 1040   K 4.0 05/21/2021 1018   CL 106 05/21/2021 1018   CO2 25 05/21/2021 1018   BUN 10 05/21/2021 1018   BUN 8 07/02/2020 1040   CREATININE 0.61 05/21/2021 1018   GLUCOSE 92 05/21/2021 1018   CALCIUM 9.6 05/21/2021 1018   CBC:    Component Value Date/Time   WBC 9.9 05/21/2021 1018   HGB 9.8 (L) 05/21/2021 1018   HGB 9.8 (L) 07/02/2020 1040   HCT 28.3 (L) 05/21/2021 1018   HCT 28.9 (L) 07/02/2020 1040   PLT 533 (H) 05/21/2021 1018   PLT 628 (H) 07/02/2020 1040   MCV 85.2 05/21/2021 1018   MCV 88 07/02/2020 1040   NEUTROABS 5.7 05/21/2021 1018   NEUTROABS 3.0 07/02/2020 1040   LYMPHSABS 2.9 05/21/2021 1018   LYMPHSABS 3.5 (H) 07/02/2020 1040   MONOABS 1.0 05/21/2021 1018   EOSABS 0.2 05/21/2021 1018   EOSABS 0.2 07/02/2020 1040   BASOSABS 0.1 05/21/2021 1018   BASOSABS 0.1 07/02/2020 1040    Recent Results (from the past 240 hour(s))  Resp Panel by RT-PCR (Flu A&B, Covid) Nasopharyngeal Swab     Status: None   Collection Time: 05/21/21  9:34 PM   Specimen: Nasopharyngeal  Swab; Nasopharyngeal(NP) swabs in vial transport medium  Result Value Ref Range Status   SARS Coronavirus 2 by RT PCR NEGATIVE NEGATIVE Final    Comment: (NOTE) SARS-CoV-2 target nucleic acids are NOT DETECTED.  The SARS-CoV-2 RNA is generally detectable in upper respiratory specimens during the acute phase of infection. The lowest concentration of SARS-CoV-2 viral copies this assay can detect is 138 copies/mL. A negative result does not preclude SARS-Cov-2 infection and should not be used as the sole basis for treatment or other patient management decisions. A negative result may occur with   improper specimen collection/handling, submission of specimen other than nasopharyngeal swab, presence of viral mutation(s) within the areas targeted by this assay, and inadequate number of viral copies(<138 copies/mL). A negative result must be combined with clinical observations, patient history, and epidemiological information. The expected result is Negative.  Fact Sheet for Patients:  EntrepreneurPulse.com.au  Fact Sheet for Healthcare Providers:  IncredibleEmployment.be  This test is no t yet approved or cleared by the [place] FDA and  has been authorized for detection and/or diagnosis of SARS-CoV-2 by FDA under an Emergency Use Authorization (EUA). This EUA will remain  in effect (meaning this test can be used) for the duration of the COVID-19 declaration under Section 564(b)(1) of the Act, 21 U.S.C.section 360bbb-3(b)(1), unless the authorization is terminated  or revoked sooner.       Influenza A by PCR NEGATIVE NEGATIVE Final   Influenza B by PCR NEGATIVE NEGATIVE Final    Comment: (NOTE) The Xpert Xpress SARS-CoV-2/FLU/RSV plus assay is intended as an aid in the diagnosis of influenza from Nasopharyngeal swab specimens and should not be used as a sole basis for treatment. Nasal washings and aspirates are unacceptable for Xpert Xpress SARS-CoV-2/FLU/RSV testing.  Fact Sheet for Patients: EntrepreneurPulse.com.au  Fact Sheet for Healthcare Providers: IncredibleEmployment.be  This test is not yet approved or cleared by the [place] FDA and has been authorized for detection and/or diagnosis of SARS-CoV-2 by FDA under an Emergency Use Authorization (EUA). This EUA will remain in effect (meaning this test can be used) for the duration of the COVID-19 declaration under Section 564(b)(1) of the Act, 21 U.S.C. section 360bbb-3(b)(1), unless the authorization is terminated  or revoked.  Performed at [facility], [place] [address], [place], [place] [address]     Studies/Results: DG Chest Port 1 View  Result Date: 05/21/2021 CLINICAL DATA:  Pain.  Sickle cell pain crisis. EXAM: PORTABLE CHEST 1 VIEW COMPARISON:  Radiograph 01/18/2021 FINDINGS: Upper normal heart size likely in part accentuated by technique.The cardiomediastinal contours are normal. The lungs are clear. Pulmonary vasculature is normal. No consolidation, pleural effusion, or pneumothorax. No acute osseous abnormalities are seen. IMPRESSION: No acute chest findings. Electronically Signed   By: Keith Rake M.D.   On: 05/21/2021 17:16    Medications: Scheduled Meds:  citalopram  20 mg Oral Daily   enoxaparin (LOVENOX) injection  40 mg Subcutaneous Q24H   HYDROmorphone   Intravenous Q4H   ketorolac  30 mg Intravenous Q6H   morphine  60 mg Oral Q12H   nicotine  14 mg Transdermal Daily   senna-docusate  1 tablet Oral BID   Continuous Infusions:  dextrose 5 % and 0.45% NaCl 100 mL/hr at 05/22/21 1049   PRN Meds:.hydrOXYzine, naloxone **AND** sodium chloride flush, ondansetron **OR** ondansetron (ZOFRAN) IV, polyethylene glycol  Consultants: None  Procedures: None  Antibiotics: None  Assessment/Plan: Principal Problem:   Sickle cell anemia with crisis ([place]) Active  Problems:   Anemia of chronic disease   Chronic musculoskeletal pain   Tobacco abuse   Depression  Sickle cell disease with pain crisis: Continue IV Dilaudid PCA without any changes in settings today Initiate oxycodone 10 mg every 6 hours Continue MS Contin 30 mg every 12 hours Toradol 15 mg IV every 6 hours for total of 5 days Monitor vital signs very closely, reevaluate pain scale regularly, and supplemental oxygen as needed.  Anemia of chronic disease: Patient's hemoglobin is 9.8, which is consistent with her baseline.  There is no clinical indication for blood transfusion at this time.  Continue to follow  closely.  Chronic pain syndrome: Continue home medications  History of depression: Continue citalopram 20 mg daily.  Patient has no suicidal or homicidal thoughts on today.  Continue to follow closely. Code Status: Full Code Family Communication: N/A Disposition Plan: Not yet ready for discharge [place], MSN, FNP-C Patient [place] [address] [place], [place] [address] [phone]  If 7PM-7AM, please contact night-coverage.  05/22/2021, 1:16 PM  LOS: 1 day

## 2021-05-23 DIAGNOSIS — G8929 Other chronic pain: Secondary | ICD-10-CM

## 2021-05-23 DIAGNOSIS — D638 Anemia in other chronic diseases classified elsewhere: Secondary | ICD-10-CM

## 2021-05-23 DIAGNOSIS — D5700 Hb-SS disease with crisis, unspecified: Secondary | ICD-10-CM | POA: Diagnosis not present

## 2021-05-23 DIAGNOSIS — Z720 Tobacco use: Secondary | ICD-10-CM | POA: Diagnosis not present

## 2021-05-23 DIAGNOSIS — M7918 Myalgia, other site: Secondary | ICD-10-CM

## 2021-05-23 MED ORDER — OXYCODONE HCL 5 MG PO TABS
10.0000 mg | ORAL_TABLET | Freq: Four times a day (QID) | ORAL | Status: DC | PRN
  Administered 2021-05-23 – 2021-05-25 (×4): 10 mg via ORAL
  Filled 2021-05-23 (×4): qty 2

## 2021-05-23 MED ORDER — HYDROMORPHONE 1 MG/ML IV SOLN
INTRAVENOUS | Status: DC
Start: 2021-05-23 — End: 2021-05-25
  Administered 2021-05-23: 8.5 mg via INTRAVENOUS
  Administered 2021-05-24: 4.5 mg via INTRAVENOUS
  Administered 2021-05-24: 9 mg via INTRAVENOUS
  Administered 2021-05-24: 12 mg via INTRAVENOUS
  Administered 2021-05-24: 4 mg via INTRAVENOUS
  Administered 2021-05-25: 4.8 mg via INTRAVENOUS
  Administered 2021-05-25: 3.6 mg via INTRAVENOUS
  Administered 2021-05-25: 1.8 mg via INTRAVENOUS
  Administered 2021-05-25: 4.2 mg via INTRAVENOUS
  Administered 2021-05-25: 30 mg via INTRAVENOUS
  Filled 2021-05-23 (×4): qty 30

## 2021-05-23 NOTE — ED Notes (Signed)
Pt has removed BP cuff secondary to discomfort

## 2021-05-23 NOTE — ED Notes (Signed)
Sandwich and cup of ice given to pt per her request

## 2021-05-23 NOTE — ED Notes (Signed)
[facility] present for transport.

## 2021-05-23 NOTE — ED Notes (Signed)
IV team unsuccessful x2 tries, will get another nurse to attempt using u/s

## 2021-05-23 NOTE — ED Notes (Signed)
MS - [place] Breakfast Ordered

## 2021-05-23 NOTE — Progress Notes (Signed)
Patient ID: Carmen [name], female   DOB: Sep 04, 1991, 30 y.o.   MRN: [id number] Subjective:  Carmen [name] is a 30 year old female with a medical history significant for sickle cell disease, chronic pain syndrome, opiate dependence and tolerance, history of anemia of chronic disease, and history of depression that was admitted for sickle cell pain crisis.  Patient seen in the emergency room, still awaiting bed placement.  She has no new complaint today, continues to have significant pain that is now relieved by current PCA setting and order medications.  Her pain is mostly in her lower back and lower extremities.  She rates her pain at 8/10 this morning.  She denies any headache, fever, cough, chest pain, shortness of breath, urinary symptoms, nausea, vomiting or diarrhea. Objective:  Vital signs in last 24 hours:  Vitals:   05/23/21 0600 05/23/21 0700 05/23/21 0815 05/23/21 0900  BP: 116/74 105/67 119/78 120/90  Pulse: (!) 57   67  Resp: '16 16 19 '$ (!) 9  Temp:      TempSrc:      SpO2: 100%   99%  Weight:      Height:        Intake/Output from previous day:  No intake or output data in the 24 hours ending 05/23/21 1256  Physical Exam: General: Alert, awake, oriented x3, in no acute distress.  HEENT: [place]/AT PEERL, EOMI Neck: Trachea midline,  no masses, no thyromegal,y no JVD, no carotid bruit OROPHARYNX:  Moist, No exudate/ erythema/lesions.  Heart: Regular rate and rhythm, without murmurs, rubs, gallops, PMI non-displaced, no heaves or thrills on palpation.  Lungs: Clear to auscultation, no wheezing or rhonchi noted. No increased vocal fremitus resonant to percussion  Abdomen: Soft, nontender, nondistended, positive bowel sounds, no masses no hepatosplenomegaly noted..  Neuro: No focal neurological deficits noted cranial nerves II through XII grossly intact. DTRs 2+ bilaterally upper and lower extremities. Strength 5 out of 5 in bilateral upper and lower extremities. Musculoskeletal:  No warm swelling or erythema around joints, no spinal tenderness noted. Psychiatric: Patient alert and oriented x3, good insight and cognition, good recent to remote recall. Lymph node survey: No cervical axillary or inguinal lymphadenopathy noted.  Lab Results:  Basic Metabolic Panel:    Component Value Date/Time   NA 138 05/21/2021 1018   NA 142 07/02/2020 1040   K 4.0 05/21/2021 1018   CL 106 05/21/2021 1018   CO2 25 05/21/2021 1018   BUN 10 05/21/2021 1018   BUN 8 07/02/2020 1040   CREATININE 0.61 05/21/2021 1018   GLUCOSE 92 05/21/2021 1018   CALCIUM 9.6 05/21/2021 1018   CBC:    Component Value Date/Time   WBC 9.9 05/21/2021 1018   HGB 9.8 (L) 05/21/2021 1018   HGB 9.8 (L) 07/02/2020 1040   HCT 28.3 (L) 05/21/2021 1018   HCT 28.9 (L) 07/02/2020 1040   PLT 533 (H) 05/21/2021 1018   PLT 628 (H) 07/02/2020 1040   MCV 85.2 05/21/2021 1018   MCV 88 07/02/2020 1040   NEUTROABS 5.7 05/21/2021 1018   NEUTROABS 3.0 07/02/2020 1040   LYMPHSABS 2.9 05/21/2021 1018   LYMPHSABS 3.5 (H) 07/02/2020 1040   MONOABS 1.0 05/21/2021 1018   EOSABS 0.2 05/21/2021 1018   EOSABS 0.2 07/02/2020 1040   BASOSABS 0.1 05/21/2021 1018   BASOSABS 0.1 07/02/2020 1040    Recent Results (from the past 240 hour(s))  Resp Panel by RT-PCR (Flu A&B, Covid) Nasopharyngeal Swab     Status: None  Collection Time: 05/21/21  9:34 PM   Specimen: Nasopharyngeal Swab; Nasopharyngeal(NP) swabs in vial transport medium  Result Value Ref Range Status   SARS Coronavirus 2 by RT PCR NEGATIVE NEGATIVE Final    Comment: (NOTE) SARS-CoV-2 target nucleic acids are NOT DETECTED.  The SARS-CoV-2 RNA is generally detectable in upper respiratory specimens during the acute phase of infection. The lowest concentration of SARS-CoV-2 viral copies this assay can detect is 138 copies/mL. A negative result does not preclude SARS-Cov-2 infection and should not be used as the sole basis for treatment or other patient  management decisions. A negative result may occur with  improper specimen collection/handling, submission of specimen other than nasopharyngeal swab, presence of viral mutation(s) within the areas targeted by this assay, and inadequate number of viral copies(<138 copies/mL). A negative result must be combined with clinical observations, patient history, and epidemiological information. The expected result is Negative.  Fact Sheet for Patients:  EntrepreneurPulse.com.au  Fact Sheet for Healthcare Providers:  IncredibleEmployment.be  This test is no t yet approved or cleared by the [place] FDA and  has been authorized for detection and/or diagnosis of SARS-CoV-2 by FDA under an Emergency Use Authorization (EUA). This EUA will remain  in effect (meaning this test can be used) for the duration of the COVID-19 declaration under Section 564(b)(1) of the Act, 21 U.S.C.section 360bbb-3(b)(1), unless the authorization is terminated  or revoked sooner.       Influenza A by PCR NEGATIVE NEGATIVE Final   Influenza B by PCR NEGATIVE NEGATIVE Final    Comment: (NOTE) The Xpert Xpress SARS-CoV-2/FLU/RSV plus assay is intended as an aid in the diagnosis of influenza from Nasopharyngeal swab specimens and should not be used as a sole basis for treatment. Nasal washings and aspirates are unacceptable for Xpert Xpress SARS-CoV-2/FLU/RSV testing.  Fact Sheet for Patients: EntrepreneurPulse.com.au  Fact Sheet for Healthcare Providers: IncredibleEmployment.be  This test is not yet approved or cleared by the [place] FDA and has been authorized for detection and/or diagnosis of SARS-CoV-2 by FDA under an Emergency Use Authorization (EUA). This EUA will remain in effect (meaning this test can be used) for the duration of the COVID-19 declaration under Section 564(b)(1) of the Act, 21 U.S.C. section 360bbb-3(b)(1),  unless the authorization is terminated or revoked.  Performed at [facility], [place] [address] [place], [place] [address]     Studies/Results: DG Chest Port 1 View  Result Date: 05/21/2021 CLINICAL DATA:  Pain.  Sickle cell pain crisis. EXAM: PORTABLE CHEST 1 VIEW COMPARISON:  Radiograph 01/18/2021 FINDINGS: Upper normal heart size likely in part accentuated by technique.The cardiomediastinal contours are normal. The lungs are clear. Pulmonary vasculature is normal. No consolidation, pleural effusion, or pneumothorax. No acute osseous abnormalities are seen. IMPRESSION: No acute chest findings. Electronically Signed   By: Keith Rake M.D.   On: 05/21/2021 17:16    Medications: Scheduled Meds:  citalopram  20 mg Oral Daily   enoxaparin (LOVENOX) injection  40 mg Subcutaneous Q24H   HYDROmorphone   Intravenous Q4H   ketorolac  30 mg Intravenous Q6H   morphine  60 mg Oral Q12H   nicotine  14 mg Transdermal Daily   senna-docusate  1 tablet Oral BID   Continuous Infusions: PRN Meds:.hydrOXYzine, naloxone **AND** sodium chloride flush, ondansetron **OR** ondansetron (ZOFRAN) IV, polyethylene glycol  Consultants: None  Procedures: None  Antibiotics: None  Assessment/Plan: Principal Problem:   Sickle cell anemia with crisis ([place]) Active Problems:   Anemia  of chronic disease   Chronic musculoskeletal pain   Tobacco abuse   Depression  Hb Sickle Cell Disease with crisis: Continue IVF 0.45% Saline '@75'$  mls/hour, adjust weight based Dilaudid PCA to 0.6/10/4.2, continue IV Toradol 15 mg Q 6 H for total of 5 days, restart and continue home medications.  Monitor vitals very closely, Re-evaluate pain scale regularly, 2 L of Oxygen by [place]. Sickle Cell Anemia: Hemoglobin is stable at baseline today.  There is no clinical indication for blood transfusion at this time.  We will continue to monitor closely and transfuse as needed. Chronic pain Syndrome: Continue oral home pain  medications as ordered. Major depressive disorder: Patient is on escitalopram 20 mg tablet p.o. daily.  We will continue.  Patient denies any suicidal ideations or thoughts at this time.  Code Status: Full Code Family Communication: N/A Disposition Plan: Not yet ready for discharge  Gilberto Streck  If 7PM-7AM, please contact night-coverage.  05/23/2021, 12:56 PM  LOS: 2 days

## 2021-05-23 NOTE — ED Notes (Signed)
IV site established by IV team via u/s, PCA restarted.  Due to extended time without adequate pain control oxycodone given po

## 2021-05-23 NOTE — ED Notes (Signed)
Pt ambulating within unit, speaking on cell phone  Denies needs at this time

## 2021-05-23 NOTE — ED Notes (Signed)
IV team at bedside, existing IV site beginning to swell. Need restart

## 2021-05-23 NOTE — ED Notes (Signed)
Awaiting IV team to restart IV,

## 2021-05-24 ENCOUNTER — Encounter (HOSPITAL_COMMUNITY): Payer: Self-pay | Admitting: Internal Medicine

## 2021-05-24 DIAGNOSIS — M7918 Myalgia, other site: Secondary | ICD-10-CM | POA: Diagnosis not present

## 2021-05-24 DIAGNOSIS — F331 Major depressive disorder, recurrent, moderate: Secondary | ICD-10-CM

## 2021-05-24 DIAGNOSIS — D5700 Hb-SS disease with crisis, unspecified: Secondary | ICD-10-CM | POA: Diagnosis not present

## 2021-05-24 DIAGNOSIS — D638 Anemia in other chronic diseases classified elsewhere: Secondary | ICD-10-CM | POA: Diagnosis not present

## 2021-05-24 LAB — CBC WITH DIFFERENTIAL/PLATELET
Band Neutrophils: 0 %
Basophils Relative: 0 %
Blasts: NONE SEEN %
Eosinophils Relative: 4 %
HCT: 24.1 % — ABNORMAL LOW (ref 36.0–46.0)
Hemoglobin: 8.7 g/dL — ABNORMAL LOW (ref 12.0–15.0)
Lymphocytes Relative: 51 %
MCH: 29.8 pg (ref 26.0–34.0)
MCHC: 36.1 g/dL — ABNORMAL HIGH (ref 30.0–36.0)
MCV: 82.5 fL (ref 80.0–100.0)
Metamyelocytes Relative: NONE SEEN %
Monocytes Relative: 8 %
Myelocytes: NONE SEEN %
Neutrophils Relative %: 37 %
Platelets: 447 10*3/uL — ABNORMAL HIGH (ref 150–400)
Promyelocytes Relative: NONE SEEN %
RBC: 2.92 MIL/uL — ABNORMAL LOW (ref 3.87–5.11)
RDW: 14.4 % (ref 11.5–15.5)
WBC Morphology: REACTIVE
WBC: 9.9 10*3/uL (ref 4.0–10.5)
nRBC: 0.3 % — ABNORMAL HIGH (ref 0.0–0.2)
nRBC: 3 /100 WBC — ABNORMAL HIGH

## 2021-05-24 LAB — BASIC METABOLIC PANEL
Anion gap: 9 (ref 5–15)
BUN: 11 mg/dL (ref 6–20)
CO2: 26 mmol/L (ref 22–32)
Calcium: 9.7 mg/dL (ref 8.9–10.3)
Chloride: 104 mmol/L (ref 98–111)
Creatinine, Ser: 0.49 mg/dL (ref 0.44–1.00)
GFR, Estimated: >60 mL/min (ref >60)
Glucose, Bld: 101 mg/dL — ABNORMAL HIGH (ref 70–99)
Potassium: 4.2 mmol/L (ref 3.5–5.1)
Sodium: 139 mmol/L (ref 135–145)

## 2021-05-24 MED ORDER — DOXYLAMINE SUCCINATE (SLEEP) 25 MG PO TABS
25.0000 mg | ORAL_TABLET | Freq: Every evening | ORAL | Status: DC | PRN
  Administered 2021-05-24 – 2021-05-26 (×3): 25 mg via ORAL
  Filled 2021-05-24 (×7): qty 1

## 2021-05-24 MED ORDER — SODIUM CHLORIDE 0.9 % IV SOLN
INTRAVENOUS | Status: DC | PRN
  Administered 2021-05-24: 500 mL via INTRAVENOUS

## 2021-05-24 NOTE — Progress Notes (Signed)
Patient ID: Carmen [name], female   DOB: 11-Jan-1991, 30 y.o.   MRN: [id number] Subjective:  Carmen [name] is a 30 year old female with a medical history significant for sickle cell disease, chronic pain syndrome, opiate dependence and tolerance, history of anemia of chronic disease, and history of depression that was admitted for sickle cell pain crisis.  Patient continues to complain of significant pain in her lower extremities and lower back, rated at 8/10 this morning.  She claims her PCA adjustment was only made this morning and she is beginning to feel just a little bit of relief.  She has no other complaint apart from the pain.  She denies any fever, cough, chest pain, shortness of breath, nausea, vomiting or diarrhea.  No urinary symptoms.  Objective:  Vital signs in last 24 hours:  Vitals:   05/24/21 0025 05/24/21 0322 05/24/21 0335 05/24/21 0953  BP:  133/84    Pulse:  70    Resp: '16 16 17 14  '$ Temp:  98.7 F (37.1 C)    TempSrc:  Oral    SpO2: 100% 100% 100% 100%  Weight:      Height:        Intake/Output from previous day:   Intake/Output Summary (Last 24 hours) at 05/24/2021 1241 Last data filed at 05/24/2021 1234 Gross per 24 hour  Intake 600 ml  Output --  Net 600 ml    Physical Exam: General: Alert, awake, oriented x3, in no acute distress.  HEENT: [place]/AT PEERL, EOMI Neck: Trachea midline,  no masses, no thyromegal,y no JVD, no carotid bruit OROPHARYNX:  Moist, No exudate/ erythema/lesions.  Heart: Regular rate and rhythm, without murmurs, rubs, gallops, PMI non-displaced, no heaves or thrills on palpation.  Lungs: Clear to auscultation, no wheezing or rhonchi noted. No increased vocal fremitus resonant to percussion  Abdomen: Soft, nontender, nondistended, positive bowel sounds, no masses no hepatosplenomegaly noted..  Neuro: No focal neurological deficits noted cranial nerves II through XII grossly intact. DTRs 2+ bilaterally upper and lower extremities. Strength  5 out of 5 in bilateral upper and lower extremities. Musculoskeletal: No warm swelling or erythema around joints, no spinal tenderness noted. Psychiatric: Patient alert and oriented x3, good insight and cognition, good recent to remote recall. Lymph node survey: No cervical axillary or inguinal lymphadenopathy noted.  Lab Results:  Basic Metabolic Panel:    Component Value Date/Time   NA 139 05/24/2021 0336   NA 142 07/02/2020 1040   K 4.2 05/24/2021 0336   CL 104 05/24/2021 0336   CO2 26 05/24/2021 0336   BUN 11 05/24/2021 0336   BUN 8 07/02/2020 1040   CREATININE 0.49 05/24/2021 0336   GLUCOSE 101 (H) 05/24/2021 0336   CALCIUM 9.7 05/24/2021 0336   CBC:    Component Value Date/Time   WBC 9.9 05/24/2021 0336   HGB 8.7 (L) 05/24/2021 0336   HGB 9.8 (L) 07/02/2020 1040   HCT 24.1 (L) 05/24/2021 0336   HCT 28.9 (L) 07/02/2020 1040   PLT 447 (H) 05/24/2021 0336   PLT 628 (H) 07/02/2020 1040   MCV 82.5 05/24/2021 0336   MCV 88 07/02/2020 1040   NEUTROABS 5.7 05/21/2021 1018   NEUTROABS 3.0 07/02/2020 1040   LYMPHSABS 2.9 05/21/2021 1018   LYMPHSABS 3.5 (H) 07/02/2020 1040   MONOABS 1.0 05/21/2021 1018   EOSABS 0.2 05/21/2021 1018   EOSABS 0.2 07/02/2020 1040   BASOSABS 0.1 05/21/2021 1018   BASOSABS 0.1 07/02/2020 1040    Recent Results (from  the past 240 hour(s))  Resp Panel by RT-PCR (Flu A&B, Covid) Nasopharyngeal Swab     Status: None   Collection Time: 05/21/21  9:34 PM   Specimen: Nasopharyngeal Swab; Nasopharyngeal(NP) swabs in vial transport medium  Result Value Ref Range Status   SARS Coronavirus 2 by RT PCR NEGATIVE NEGATIVE Final    Comment: (NOTE) SARS-CoV-2 target nucleic acids are NOT DETECTED.  The SARS-CoV-2 RNA is generally detectable in upper respiratory specimens during the acute phase of infection. The lowest concentration of SARS-CoV-2 viral copies this assay can detect is 138 copies/mL. A negative result does not preclude SARS-Cov-2 infection  and should not be used as the sole basis for treatment or other patient management decisions. A negative result may occur with  improper specimen collection/handling, submission of specimen other than nasopharyngeal swab, presence of viral mutation(s) within the areas targeted by this assay, and inadequate number of viral copies(<138 copies/mL). A negative result must be combined with clinical observations, patient history, and epidemiological information. The expected result is Negative.  Fact Sheet for Patients:  EntrepreneurPulse.com.au  Fact Sheet for Healthcare Providers:  IncredibleEmployment.be  This test is no t yet approved or cleared by the [place] FDA and  has been authorized for detection and/or diagnosis of SARS-CoV-2 by FDA under an Emergency Use Authorization (EUA). This EUA will remain  in effect (meaning this test can be used) for the duration of the COVID-19 declaration under Section 564(b)(1) of the Act, 21 U.S.C.section 360bbb-3(b)(1), unless the authorization is terminated  or revoked sooner.       Influenza A by PCR NEGATIVE NEGATIVE Final   Influenza B by PCR NEGATIVE NEGATIVE Final    Comment: (NOTE) The Xpert Xpress SARS-CoV-2/FLU/RSV plus assay is intended as an aid in the diagnosis of influenza from Nasopharyngeal swab specimens and should not be used as a sole basis for treatment. Nasal washings and aspirates are unacceptable for Xpert Xpress SARS-CoV-2/FLU/RSV testing.  Fact Sheet for Patients: EntrepreneurPulse.com.au  Fact Sheet for Healthcare Providers: IncredibleEmployment.be  This test is not yet approved or cleared by the [place] FDA and has been authorized for detection and/or diagnosis of SARS-CoV-2 by FDA under an Emergency Use Authorization (EUA). This EUA will remain in effect (meaning this test can be used) for the duration of the COVID-19 declaration  under Section 564(b)(1) of the Act, 21 U.S.C. section 360bbb-3(b)(1), unless the authorization is terminated or revoked.  Performed at [name], [place] [address], [place], [place] [address]     Studies/Results: No results found.  Medications: Scheduled Meds:  citalopram  20 mg Oral Daily   enoxaparin (LOVENOX) injection  40 mg Subcutaneous Q24H   HYDROmorphone   Intravenous Q4H   ketorolac  30 mg Intravenous Q6H   morphine  60 mg Oral Q12H   nicotine  14 mg Transdermal Daily   senna-docusate  1 tablet Oral BID   Continuous Infusions:  sodium chloride 500 mL (05/24/21 0953)   PRN Meds:.sodium chloride, doxylamine (Sleep), hydrOXYzine, naloxone **AND** sodium chloride flush, ondansetron **OR** ondansetron (ZOFRAN) IV, oxyCODONE, polyethylene glycol  Consultants: None  Procedures: None  Antibiotics: None  Assessment/Plan: Principal Problem:   Sickle cell anemia with crisis ([place]) Active Problems:   Anemia of chronic disease   Chronic musculoskeletal pain   Tobacco abuse   Depression  Hb Sickle Cell Disease with crisis: Reduce IVF to [facility], continue weight based Dilaudid PCA at current setting of 0.6/10/4.2, continue IV Toradol 15 mg Q 6 H  for total of 5 days, continue home medications. Monitor vitals very closely, Re-evaluate pain scale regularly, 2 L of Oxygen by Capac. Sickle Cell Anemia: Hemoglobin continues to be stable at baseline. There is no clinical indication for blood transfusion at this time. We will continue to monitor closely and transfuse as needed. Chronic pain Syndrome: Continue oral home pain medications as ordered. Major depressive disorder: Patient is on escitalopram 20 mg tablet p.o. daily. We will continue. Patient denies any suicidal ideations or thoughts.  Code Status: Full Code Family Communication: N/A Disposition Plan: Not yet ready for discharge  Thanh [place]  If 7PM-7AM, please contact night-coverage.  05/24/2021, 12:41 PM  LOS:  3 days

## 2021-05-24 NOTE — Plan of Care (Signed)
  Problem: Education: Goal: Knowledge of General Education information will improve Description Including pain rating scale, medication(s)/side effects and non-pharmacologic comfort measures Outcome: Progressing   

## 2021-05-24 NOTE — Plan of Care (Signed)
Care plan initiated.

## 2021-05-25 DIAGNOSIS — D5700 Hb-SS disease with crisis, unspecified: Secondary | ICD-10-CM | POA: Diagnosis not present

## 2021-05-25 MED ORDER — HYDROMORPHONE 1 MG/ML IV SOLN
INTRAVENOUS | Status: DC
  Administered 2021-05-25: 9.6 mg via INTRAVENOUS
  Administered 2021-05-26: 30 mg via INTRAVENOUS
  Filled 2021-05-25 (×8): qty 30

## 2021-05-25 MED ORDER — OXYCODONE HCL 5 MG PO TABS
10.0000 mg | ORAL_TABLET | ORAL | Status: DC
  Administered 2021-05-25 – 2021-05-26 (×5): 10 mg via ORAL
  Filled 2021-05-25 (×6): qty 2

## 2021-05-25 MED ORDER — KETOROLAC TROMETHAMINE 30 MG/ML IJ SOLN
15.0000 mg | Freq: Four times a day (QID) | INTRAMUSCULAR | Status: DC
  Administered 2021-05-25 – 2021-05-26 (×4): 15 mg via INTRAVENOUS
  Filled 2021-05-25 (×4): qty 1

## 2021-05-25 NOTE — Plan of Care (Signed)
Plan of care reviewed and discussed with the patient. 

## 2021-05-25 NOTE — Progress Notes (Signed)
Subjective: Carmen [name] is a 30 year old female with a medical history significant for sickle cell disease, chronic pain syndrome, opiate dependence and tolerance, and history of anemia of chronic disease that was admitted for sickle cell pain crisis.  Patient states that pain intensity has not improved very much overnight.  She rates her pain as 7/10 primarily to low back and lower extremities.  Patient denies any headache, chest pain, shortness of breath, dizziness, urinary symptoms, nausea, vomiting, or diarrhea.  Objective:  Vital signs in last 24 hours:  Vitals:   05/25/21 0607 05/25/21 0946 05/25/21 1137 05/25/21 1207  BP: (!) 88/66     Pulse: 69     Resp: '16 16 18 15  '$ Temp: 98.4 F (36.9 C)     TempSrc: Axillary     SpO2: 100% 100% 93% 94%  Weight:      Height:        Intake/Output from previous day:   Intake/Output Summary (Last 24 hours) at 05/25/2021 1216 Last data filed at 05/25/2021 1054 Gross per 24 hour  Intake 1259.97 ml  Output --  Net 1259.97 ml    Physical Exam: General: Alert, awake, oriented x3, in no acute distress.  HEENT: [place]/AT PEERL, EOMI Neck: Trachea midline,  no masses, no thyromegal,y no JVD, no carotid bruit OROPHARYNX:  Moist, No exudate/ erythema/lesions.  Heart: Regular rate and rhythm, without murmurs, rubs, gallops, PMI non-displaced, no heaves or thrills on palpation.  Lungs: Clear to auscultation, no wheezing or rhonchi noted. No increased vocal fremitus resonant to percussion  Abdomen: Soft, nontender, nondistended, positive bowel sounds, no masses no hepatosplenomegaly noted..  Neuro: No focal neurological deficits noted cranial nerves II through XII grossly intact. DTRs 2+ bilaterally upper and lower extremities. Strength 5 out of 5 in bilateral upper and lower extremities. Musculoskeletal: No warm swelling or erythema around joints, no spinal tenderness noted. Psychiatric: Patient alert and oriented x3, good insight and cognition, good  recent to remote recall. Lymph node survey: No cervical axillary or inguinal lymphadenopathy noted.  Lab Results:  Basic Metabolic Panel:    Component Value Date/Time   NA 139 05/24/2021 0336   NA 142 07/02/2020 1040   K 4.2 05/24/2021 0336   CL 104 05/24/2021 0336   CO2 26 05/24/2021 0336   BUN 11 05/24/2021 0336   BUN 8 07/02/2020 1040   CREATININE 0.49 05/24/2021 0336   GLUCOSE 101 (H) 05/24/2021 0336   CALCIUM 9.7 05/24/2021 0336   CBC:    Component Value Date/Time   WBC 9.9 05/24/2021 0336   HGB 8.7 (L) 05/24/2021 0336   HGB 9.8 (L) 07/02/2020 1040   HCT 24.1 (L) 05/24/2021 0336   HCT 28.9 (L) 07/02/2020 1040   PLT 447 (H) 05/24/2021 0336   PLT 628 (H) 07/02/2020 1040   MCV 82.5 05/24/2021 0336   MCV 88 07/02/2020 1040   NEUTROABS 5.7 05/21/2021 1018   NEUTROABS 3.0 07/02/2020 1040   LYMPHSABS 2.9 05/21/2021 1018   LYMPHSABS 3.5 (H) 07/02/2020 1040   MONOABS 1.0 05/21/2021 1018   EOSABS 0.2 05/21/2021 1018   EOSABS 0.2 07/02/2020 1040   BASOSABS 0.1 05/21/2021 1018   BASOSABS 0.1 07/02/2020 1040    Recent Results (from the past 240 hour(s))  Resp Panel by RT-PCR (Flu A&B, Covid) Nasopharyngeal Swab     Status: None   Collection Time: 05/21/21  9:34 PM   Specimen: Nasopharyngeal Swab; Nasopharyngeal(NP) swabs in vial transport medium  Result Value Ref Range Status   SARS  Coronavirus 2 by RT PCR NEGATIVE NEGATIVE Final    Comment: (NOTE) SARS-CoV-2 target nucleic acids are NOT DETECTED.  The SARS-CoV-2 RNA is generally detectable in upper respiratory specimens during the acute phase of infection. The lowest concentration of SARS-CoV-2 viral copies this assay can detect is 138 copies/mL. A negative result does not preclude SARS-Cov-2 infection and should not be used as the sole basis for treatment or other patient management decisions. A negative result may occur with  improper specimen collection/handling, submission of specimen other than nasopharyngeal  swab, presence of viral mutation(s) within the areas targeted by this assay, and inadequate number of viral copies(<138 copies/mL). A negative result must be combined with clinical observations, patient history, and epidemiological information. The expected result is Negative.  Fact Sheet for Patients:  EntrepreneurPulse.com.au  Fact Sheet for Healthcare Providers:  IncredibleEmployment.be  This test is no t yet approved or cleared by the [place] FDA and  has been authorized for detection and/or diagnosis of SARS-CoV-2 by FDA under an Emergency Use Authorization (EUA). This EUA will remain  in effect (meaning this test can be used) for the duration of the COVID-19 declaration under Section 564(b)(1) of the Act, 21 U.S.C.section 360bbb-3(b)(1), unless the authorization is terminated  or revoked sooner.       Influenza A by PCR NEGATIVE NEGATIVE Final   Influenza B by PCR NEGATIVE NEGATIVE Final    Comment: (NOTE) The Xpert Xpress SARS-CoV-2/FLU/RSV plus assay is intended as an aid in the diagnosis of influenza from Nasopharyngeal swab specimens and should not be used as a sole basis for treatment. Nasal washings and aspirates are unacceptable for Xpert Xpress SARS-CoV-2/FLU/RSV testing.  Fact Sheet for Patients: EntrepreneurPulse.com.au  Fact Sheet for Healthcare Providers: IncredibleEmployment.be  This test is not yet approved or cleared by the [place] FDA and has been authorized for detection and/or diagnosis of SARS-CoV-2 by FDA under an Emergency Use Authorization (EUA). This EUA will remain in effect (meaning this test can be used) for the duration of the COVID-19 declaration under Section 564(b)(1) of the Act, 21 U.S.C. section 360bbb-3(b)(1), unless the authorization is terminated or revoked.  Performed at [facility], [place] [address], [place], [place] [address]      Studies/Results: No results found.  Medications: Scheduled Meds:  citalopram  20 mg Oral Daily   enoxaparin (LOVENOX) injection  40 mg Subcutaneous Q24H   HYDROmorphone   Intravenous Q4H   ketorolac  15 mg Intravenous Q6H   morphine  60 mg Oral Q12H   nicotine  14 mg Transdermal Daily   oxyCODONE  10 mg Oral Q4H while awake   senna-docusate  1 tablet Oral BID   Continuous Infusions:  sodium chloride 500 mL (05/24/21 0953)   PRN Meds:.sodium chloride, doxylamine (Sleep), hydrOXYzine, naloxone **AND** sodium chloride flush, ondansetron **OR** ondansetron (ZOFRAN) IV, polyethylene glycol  Consultants: None  Procedures: None  Antibiotics: None  Assessment/Plan: Principal Problem:   Sickle cell anemia with crisis ([facility]) Active Problems:   Anemia of chronic disease   Chronic musculoskeletal pain   Tobacco abuse   Depression  Sickle cell disease with pain crisis: Continue IV fluids at [facility] IV Dilaudid PCA Toradol 15 mg IV every 6 hours for total of 5 days Oxycodone 10 mg every 4 hours while awake Monitor vital signs very closely, reevaluate pain scale regularly, and supplemental oxygen as needed. Patient encouraged to ambulate in the halls today.  Possible discharge 05/26/2021.  Sickle cell anemia: Hemoglobin stable  and consistent with patient's baseline.  There is no clinical indication for blood transfusion at this time.  Continue to monitor closely.  Chronic pain syndrome: Continue home medications  Major depressive disorder: Continue escitalopram 20 mg daily.  Patient denies any suicidal homicidal intentions.  Code Status: Full Code Family Communication: N/A Disposition Plan: Not yet ready for discharge  [place], MSN, FNP-C Patient [place] [address] [place], [place] [address] [phone]  If 5PM-8AM, please contact night-coverage.  05/25/2021, 12:16 PM  LOS: 4 days

## 2021-05-26 DIAGNOSIS — D5700 Hb-SS disease with crisis, unspecified: Secondary | ICD-10-CM | POA: Diagnosis not present

## 2021-05-26 MED ORDER — OXYCODONE HCL 5 MG PO TABS
10.0000 mg | ORAL_TABLET | Freq: Once | ORAL | Status: AC
  Administered 2021-05-26: 10 mg via ORAL

## 2021-05-26 NOTE — Progress Notes (Signed)
Patient still in restroom, Dc'd PCA @ 1000 while patient was using the restroom. Instructed patient to call when she was finished in the restroom so I could give her medication along with her discharge instructions. Patient agreed. No complaints or needs at this time.

## 2021-05-26 NOTE — Discharge Summary (Signed)
Physician Discharge Summary  Carmen [name] [id number] DOB: 1991/07/02 DOA: 05/21/2021  PCP: Vevelyn Francois, NP  Admit date: 05/21/2021  Discharge date: 05/26/2021  Discharge Diagnoses:  Principal Problem:   Sickle cell anemia with crisis [facility]) Active Problems:   Anemia of chronic disease   Chronic musculoskeletal pain   Tobacco abuse   Depression   Discharge Condition: Stable  Disposition:  Pt is discharged home in good condition and is to follow up with Vevelyn Francois, NP this week to have labs evaluated. Carmen [name] is instructed to increase activity slowly and balance with rest for the next few days, and use prescribed medication to complete treatment of pain  Diet: Regular Wt Readings from Last 3 Encounters:  05/22/21 79 kg  03/13/21 79 kg  03/02/21 71.7 kg    History of present illness:  Carmen [name] is a 30 year old female with a medical history significant for sickle cell disease, chronic pain syndrome, opiate dependence and tolerance, history of depression, and history of anemia of chronic disease presented to the emergency department with pain in arms, back, and legs that was consistent with her typical sickle cell pain.  She has been having symptoms over the past 2 days.  Patient attempted to go to sickle cell pain clinic, but it was closed.  She presented to [facility], ER because it was closer to her home.  Pain has been getting progressively worse.  Patient attempted to use her home medications which seem to help initially.  No fevers, no chills, no shortness of breath, or cough.  Last admission for sickle cell pain was in May 2022.  On regimen is morphine 60 mg every 12 hours and oxycodone.  Patient also takes folic acid.  Patient has been fully vaccinated against COVID-19. ER course: While in ER, patient's vital signs remained stable.  No improvement in pain despite IV Dilaudid, IV fluids, and IV Toradol.  Patient admitted to [facility] for further management of  sickle cell crisis.   Hospital Course:  Sickle cell disease with pain crisis: Patient was admitted for sickle cell pain crisis and managed appropriately with IVF, IV Dilaudid via PCA and IV Toradol, as well as other adjunct therapies per sickle cell pain management protocols.  IV Dilaudid PCA weaned appropriately.  Patient transition to home medications.  Today, pain intensity has decreased to 4/10 and patient is requesting discharge home.  She states that she can manage pain at home on current medication regimen.  Patient's pain medications are managed by her PCP, she has an appointment to follow-up for medication management. Also, labs have remained stable throughout admission.  Hemoglobin is 8.7 prior to discharge which is consistent with patient's baseline. Patient is alert, oriented, and ambulating without assistance.  Patient was therefore discharged home today in a hemodynamically stable condition.   Carmen [name] will follow-up with PCP within 1 week of this discharge. Carmen [name] was counseled extensively about nonpharmacologic means of pain management, patient verbalized understanding and was appreciative of  the care received during this admission.   We discussed the need for good hydration, monitoring of hydration status, avoidance of heat, cold, stress, and infection triggers. We discussed the need to be adherent with taking home medications. Patient was reminded of the need to seek medical attention immediately if any symptom of bleeding, anemia, or infection occurs.  Discharge Exam: Vitals:   05/26/21 0400 05/26/21 0632  BP:  (!) 105/54  Pulse:  75  Resp: 16 17  Temp:  98  F (36.7 C)  SpO2: 99% 100%   Vitals:   05/26/21 0037 05/26/21 0200 05/26/21 0400 05/26/21 0632  BP:  128/75  (!) 105/54  Pulse:  77  75  Resp: '17 18 16 17  '$ Temp:  98.2 F (36.8 C)  98 F (36.7 C)  TempSrc:  Oral  Oral  SpO2: 100% 96% 99% 100%  Weight:      Height:        General appearance : Awake, alert, not  in any distress. Speech Clear. Not toxic looking HEENT: Atraumatic and Normocephalic, pupils equally reactive to light and accomodation Neck: Supple, no JVD. No cervical lymphadenopathy.  Chest: Good air entry bilaterally, no added sounds  CVS: S1 S2 regular, no murmurs.  Abdomen: Bowel sounds present, Non tender and not distended with no gaurding, rigidity or rebound. Extremities: B/L Lower Ext shows no edema, both legs are warm to touch Neurology: Awake alert, and oriented X 3, CN II-XII intact, Non focal Skin: No Rash  Discharge Instructions  Discharge Instructions     Discharge patient   Complete by: As directed    Discharge disposition: 01-Home or Self Care   Discharge patient date: 05/26/2021      Allergies as of 05/26/2021   No Known Allergies      Medication List     TAKE these medications    folic acid 1 MG tablet Commonly known as: FOLVITE Take 1 tablet (1 mg total) by mouth daily.   morphine 60 MG 12 hr tablet Commonly known as: MS Contin Take 1 tablet (60 mg total) by mouth every 12 (twelve) hours.       ASK your doctor about these medications    citalopram 20 MG tablet Commonly known as: CELEXA TAKE 1 TABLET BY MOUTH EVERY DAY   cyclobenzaprine 10 MG tablet Commonly known as: FLEXERIL TAKE 1 TABLET BY MOUTH TWICE A DAY   Oxycodone HCl 10 MG Tabs Take 1 tablet (10 mg total) by mouth every 6 (six) hours as needed for up to 15 days (pain).   Prenatal Vitamin Plus Low Iron 27-1 MG Tabs TAKE 1 TABLET BY MOUTH EVERY DAY   Vitamin D (Ergocalciferol) 1.25 MG (50000 UNIT) Caps capsule Commonly known as: DRISDOL TAKE ONE CAPSULE BY MOUTH ONE TIME PER WEEK        The results of significant diagnostics from this hospitalization (including imaging, microbiology, ancillary and laboratory) are listed below for reference.    Significant Diagnostic Studies: DG Chest Port 1 View  Result Date: 05/21/2021 CLINICAL DATA:  Pain.  Sickle cell pain crisis.  EXAM: PORTABLE CHEST 1 VIEW COMPARISON:  Radiograph 01/18/2021 FINDINGS: Upper normal heart size likely in part accentuated by technique.The cardiomediastinal contours are normal. The lungs are clear. Pulmonary vasculature is normal. No consolidation, pleural effusion, or pneumothorax. No acute osseous abnormalities are seen. IMPRESSION: No acute chest findings. Electronically Signed   By: Keith Rake M.D.   On: 05/21/2021 17:16    Microbiology: Recent Results (from the past 240 hour(s))  Resp Panel by RT-PCR (Flu A&B, Covid) Nasopharyngeal Swab     Status: None   Collection Time: 05/21/21  9:34 PM   Specimen: Nasopharyngeal Swab; Nasopharyngeal(NP) swabs in vial transport medium  Result Value Ref Range Status   SARS Coronavirus 2 by RT PCR NEGATIVE NEGATIVE Final    Comment: (NOTE) SARS-CoV-2 target nucleic acids are NOT DETECTED.  The SARS-CoV-2 RNA is generally detectable in upper respiratory specimens during the acute phase of infection.  The lowest concentration of SARS-CoV-2 viral copies this assay can detect is 138 copies/mL. A negative result does not preclude SARS-Cov-2 infection and should not be used as the sole basis for treatment or other patient management decisions. A negative result may occur with  improper specimen collection/handling, submission of specimen other than nasopharyngeal swab, presence of viral mutation(s) within the areas targeted by this assay, and inadequate number of viral copies(<138 copies/mL). A negative result must be combined with clinical observations, patient history, and epidemiological information. The expected result is Negative.  Fact Sheet for Patients:  EntrepreneurPulse.com.au  Fact Sheet for Healthcare Providers:  IncredibleEmployment.be  This test is no t yet approved or cleared by the [place] FDA and  has been authorized for detection and/or diagnosis of SARS-CoV-2 by FDA under an  Emergency Use Authorization (EUA). This EUA will remain  in effect (meaning this test can be used) for the duration of the COVID-19 declaration under Section 564(b)(1) of the Act, 21 U.S.C.section 360bbb-3(b)(1), unless the authorization is terminated  or revoked sooner.       Influenza A by PCR NEGATIVE NEGATIVE Final   Influenza B by PCR NEGATIVE NEGATIVE Final    Comment: (NOTE) The Xpert Xpress SARS-CoV-2/FLU/RSV plus assay is intended as an aid in the diagnosis of influenza from Nasopharyngeal swab specimens and should not be used as a sole basis for treatment. Nasal washings and aspirates are unacceptable for Xpert Xpress SARS-CoV-2/FLU/RSV testing.  Fact Sheet for Patients: EntrepreneurPulse.com.au  Fact Sheet for Healthcare Providers: IncredibleEmployment.be  This test is not yet approved or cleared by the [place] FDA and has been authorized for detection and/or diagnosis of SARS-CoV-2 by FDA under an Emergency Use Authorization (EUA). This EUA will remain in effect (meaning this test can be used) for the duration of the COVID-19 declaration under Section 564(b)(1) of the Act, 21 U.S.C. section 360bbb-3(b)(1), unless the authorization is terminated or revoked.  Performed at [facility], [place] [address] [place], [place] [address]      Labs: Basic Metabolic Panel: Recent Labs  Lab 05/21/21 1018 05/22/21 0416 05/24/21 0336  NA 138  --  139  K 4.0  --  4.2  CL 106  --  104  CO2 25  --  26  GLUCOSE 92  --  101*  BUN 10  --  11  CREATININE 0.61  --  0.49  CALCIUM 9.6  --  9.7  MG  --  1.8  --   PHOS  --  3.6  --    Liver Function Tests: Recent Labs  Lab 05/21/21 1018  AST 25  ALT 21  ALKPHOS 63  BILITOT 1.1  PROT 8.1  ALBUMIN 4.2   No results for input(s): LIPASE, AMYLASE in the last 168 hours. No results for input(s): AMMONIA in the last 168 hours. CBC: Recent Labs  Lab 05/21/21 1018  05/24/21 0336  WBC 9.9 9.9  NEUTROABS 5.7  --   HGB 9.8* 8.7*  HCT 28.3* 24.1*  MCV 85.2 82.5  PLT 533* 447*   Cardiac Enzymes: No results for input(s): CKTOTAL, CKMB, CKMBINDEX, TROPONINI in the last 168 hours. BNP: Invalid input(s): POCBNP CBG: No results for input(s): GLUCAP in the last 168 hours.  Time coordinating discharge: 30 minutes  Signed:  [name]  APRN, MSN, FNP-C [facility] [address] [place], [place] [address] [phone]  [facility] 05/26/2021, 8:07 AM

## 2021-05-28 ENCOUNTER — Other Ambulatory Visit: Payer: Self-pay

## 2021-05-28 ENCOUNTER — Telehealth (INDEPENDENT_AMBULATORY_CARE_PROVIDER_SITE_OTHER): Payer: Medicaid Other | Admitting: Nurse Practitioner

## 2021-05-28 DIAGNOSIS — F331 Major depressive disorder, recurrent, moderate: Secondary | ICD-10-CM | POA: Insufficient documentation

## 2021-05-28 DIAGNOSIS — F322 Major depressive disorder, single episode, severe without psychotic features: Secondary | ICD-10-CM | POA: Insufficient documentation

## 2021-05-28 DIAGNOSIS — D571 Sickle-cell disease without crisis: Secondary | ICD-10-CM

## 2021-05-28 DIAGNOSIS — Z79891 Long term (current) use of opiate analgesic: Secondary | ICD-10-CM | POA: Diagnosis not present

## 2021-05-28 NOTE — Progress Notes (Signed)
   [place], [place]  [address] Phone:  [phone]   Fax:  [phone] Virtual Visit via Video Note  I connected with Carmen [name] on 05/28/21 at  3:00 PM EDT by video and verified that I am speaking with the correct person using two identifiers.   I discussed the limitations, risks, security and privacy concerns of performing an evaluation and management service by video and the availability of in person appointments. I also discussed with the patient that there may be a patient responsible charge related to this service. The patient expressed understanding and agreed to proceed.  Patient home Provider Office  History of Present Illness: She is in today for a follow up. She  has a past medical history of Anxiety, Headache(784.0), Heart murmur, Sickle cell crisis ([place]), Syphilis (2015), and Thrombocytosis (11/22/2014).   Patient is in today for hospital follow-up. Recently admitted for SCD crisis. Hospital course of treatment was from 05/21/21 to 05/24/21.  During hospital course IVF, IV Dilaudid via PCA and IV Toradol, as well as other adjunct therapies per sickle cell pain management protocols. Hemoglobin is 8.7 prior to discharge which is consistent with patient's baseline.  The call was disconnected.     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 The patient was provided an opportunity to ask questions and all were answered. The patient agreed with the plan and demonstrated an understanding of the instructions.   The patient was advised to call back or seek an in-person evaluation if the symptoms  worsen or if the condition fails to improve as anticipated.  I provided 5 minutes of video- visit time during this encounter.   Vevelyn Francois, NP

## 2021-05-28 NOTE — Patient Instructions (Signed)
Sickle Cell Anemia, Adult  Sickle cell anemia is a condition where your red blood cells are shaped like sickles. Red blood cells carry oxygen through the body. Sickle-shaped cells do not live as long as normal red blood cells. They also clump together and block blood from flowing through the blood vessels. This prevents the body from getting enough oxygen. Sickle cell anemia causes organ damage and pain. It alsoincreases the risk of infection. Follow these instructions at home: Medicines Take over-the-counter and prescription medicines only as told by your doctor. If you were prescribed an antibiotic medicine, take it as told by your doctor. Do not stop taking the antibiotic even if you start to feel better. If you develop a fever, do not take medicines to lower the fever right away. Tell your doctor about the fever. Managing pain, stiffness, and swelling Try these methods to help with pain: Use a heating pad. Take a warm bath. Distract yourself, such as by watching TV. Eating and drinking Drink enough fluid to keep your pee (urine) clear or pale yellow. Drink more in hot weather and during exercise. Limit or avoid alcohol. Eat a healthy diet. Eat plenty of fruits, vegetables, whole grains, and lean protein. Take vitamins and supplements as told by your doctor. Traveling When traveling, keep these with you: Your medical information. The names of your doctors. Your medicines. If you need to take an airplane, talk to your doctor first. Activity Rest often. Avoid exercises that make your heart beat much faster, such as jogging. General instructions Do not use products that have nicotine or tobacco, such as cigarettes and e-cigarettes. If you need help quitting, ask your doctor. Consider wearing a medical alert bracelet. Avoid being in high places (high altitudes), such as mountains. Avoid very hot or cold temperatures. Avoid places where the temperature changes a lot. Keep all follow-up  visits as told by your doctor. This is important. Contact a doctor if: A joint hurts. Your feet or hands hurt or swell. You feel tired (fatigued). Get help right away if: You have symptoms of infection. These include: Fever. Chills. Being very tired. Irritability. Poor eating. Throwing up (vomiting). You feel dizzy or faint. You have new stomach pain, especially on the left side. You have a an erection (priapism) that lasts more than 4 hours. You have numbness in your arms or legs. You have a hard time moving your arms or legs. You have trouble talking. You have pain that does not go away when you take medicine. You are short of breath. You are breathing fast. You have a long-term cough. You have pain in your chest. You have a bad headache. You have a stiff neck. Your stomach looks bloated even though you did not eat much. Your skin is pale. You suddenly cannot see well. Summary Sickle cell anemia is a condition where your red blood cells are shaped like sickles. Follow your doctor's advice on ways to manage pain, food to eat, activities to do, and steps to take for safe travel. Get medical help right away if you have any signs of infection, such as a fever. This information is not intended to replace advice given to you by your health care provider. Make sure you discuss any questions you have with your healthcare provider. Document Revised: 03/13/2020 Document Reviewed: 03/13/2020 Elsevier Patient Education  [place].

## 2021-06-04 ENCOUNTER — Encounter: Payer: Self-pay | Admitting: Nurse Practitioner

## 2021-06-04 ENCOUNTER — Telehealth (INDEPENDENT_AMBULATORY_CARE_PROVIDER_SITE_OTHER): Payer: Medicaid Other | Admitting: Nurse Practitioner

## 2021-06-04 ENCOUNTER — Other Ambulatory Visit: Payer: Self-pay

## 2021-06-04 DIAGNOSIS — R21 Rash and other nonspecific skin eruption: Secondary | ICD-10-CM

## 2021-06-04 DIAGNOSIS — Z79891 Long term (current) use of opiate analgesic: Secondary | ICD-10-CM | POA: Diagnosis not present

## 2021-06-04 DIAGNOSIS — D571 Sickle-cell disease without crisis: Secondary | ICD-10-CM

## 2021-06-04 DIAGNOSIS — F331 Major depressive disorder, recurrent, moderate: Secondary | ICD-10-CM | POA: Diagnosis not present

## 2021-06-04 MED ORDER — OXYCODONE HCL 10 MG PO TABS: 10.0000 mg | ORAL_TABLET | Freq: Four times a day (QID) | ORAL | 0 refills | Status: DC | PRN

## 2021-06-04 MED ORDER — BETAMETHASONE DIPROPIONATE AUG 0.05 % EX CREA: TOPICAL_CREAM | Freq: Two times a day (BID) | CUTANEOUS | 1 refills | Status: DC

## 2021-06-04 NOTE — Patient Instructions (Signed)
Sickle Cell Anemia, Adult  Sickle cell anemia is a condition where your red blood cells are shaped like sickles. Red blood cells carry oxygen through the body. Sickle-shaped cells do not live as long as normal red blood cells. They also clump together and block blood from flowing through the blood vessels. This prevents the body from getting enough oxygen. Sickle cell anemia causes organ damage and pain. It alsoincreases the risk of infection. Follow these instructions at home: Medicines Take over-the-counter and prescription medicines only as told by your doctor. If you were prescribed an antibiotic medicine, take it as told by your doctor. Do not stop taking the antibiotic even if you start to feel better. If you develop a fever, do not take medicines to lower the fever right away. Tell your doctor about the fever. Managing pain, stiffness, and swelling Try these methods to help with pain: Use a heating pad. Take a warm bath. Distract yourself, such as by watching TV. Eating and drinking Drink enough fluid to keep your pee (urine) clear or pale yellow. Drink more in hot weather and during exercise. Limit or avoid alcohol. Eat a healthy diet. Eat plenty of fruits, vegetables, whole grains, and lean protein. Take vitamins and supplements as told by your doctor. Traveling When traveling, keep these with you: Your medical information. The names of your doctors. Your medicines. If you need to take an airplane, talk to your doctor first. Activity Rest often. Avoid exercises that make your heart beat much faster, such as jogging. General instructions Do not use products that have nicotine or tobacco, such as cigarettes and e-cigarettes. If you need help quitting, ask your doctor. Consider wearing a medical alert bracelet. Avoid being in high places (high altitudes), such as mountains. Avoid very hot or cold temperatures. Avoid places where the temperature changes a lot. Keep all follow-up  visits as told by your doctor. This is important. Contact a doctor if: A joint hurts. Your feet or hands hurt or swell. You feel tired (fatigued). Get help right away if: You have symptoms of infection. These include: Fever. Chills. Being very tired. Irritability. Poor eating. Throwing up (vomiting). You feel dizzy or faint. You have new stomach pain, especially on the left side. You have a an erection (priapism) that lasts more than 4 hours. You have numbness in your arms or legs. You have a hard time moving your arms or legs. You have trouble talking. You have pain that does not go away when you take medicine. You are short of breath. You are breathing fast. You have a long-term cough. You have pain in your chest. You have a bad headache. You have a stiff neck. Your stomach looks bloated even though you did not eat much. Your skin is pale. You suddenly cannot see well. Summary Sickle cell anemia is a condition where your red blood cells are shaped like sickles. Follow your doctor's advice on ways to manage pain, food to eat, activities to do, and steps to take for safe travel. Get medical help right away if you have any signs of infection, such as a fever. This information is not intended to replace advice given to you by your health care provider. Make sure you discuss any questions you have with your healthcare provider. Document Revised: 03/13/2020 Document Reviewed: 03/13/2020 Elsevier Patient Education  [place].

## 2021-06-04 NOTE — Progress Notes (Signed)
   [place], [place]  [address] Phone:  [phone]   Fax:  [phone] Virtual Visit via Telephone Note  I connected with [name] on 06/04/21 at  3:20 PM EDT by telephone and verified that I am speaking with the correct person using two identifiers.   I discussed the limitations, risks, security and privacy concerns of performing an evaluation and management service by telephone and the availability of in person appointments. I also discussed with the patient that there may be a patient responsible charge related to this service. The patient expressed understanding and agreed to proceed.  Patient home Provider Office  History of Present Illness:  Carmen [name]  has a past medical history of Anxiety, Headache(784.0), Heart murmur, Sickle cell crisis ([place]), Syphilis (2015), and Thrombocytosis (11/22/2014).   She is having more pain with her 7/10 mid to lower back. The pain radiates all across. She is having to take Oxycodone 10 mg TID. She feels like this is effective for her pain . However her overall pain is worse.    She has dry blotches on her skin. She feels like it may be psoriasis. She reports that it is flaky and itchy. She has tired the Cetaphil lotion, hydrocortisone cream and tea tree oil. She denies any open sores or lesions.  She reports a family history of psoriasis.  Denies fever, headache, cough, wheezing, shortness of breath, chest pains, abdominal pain, hip pain, or leg pain.  She will make an apt with eye care specialist.  ROS    Observations/Objective: No exam; telephone visit  Assessment and Plan: Hb-SS disease without crisis ([place])  Ensure adequate hydration. Move frequently to reduce venous thromboembolism risk. Avoid situations that could lead to dehydration or could exacerbate pain Discussed S&S of infection, seizures, stroke acute chest, DVT and how important it is to seek medical attention Take medication  as directed along with pain contract and overall compliance Discussed the risk related to opiate use (addition, tolerance and dependency)  Chronic prescription opiate use Persistent however currently controlled with home regimen oxycodone 10 mg and mostly 60 mg  Moderate episode of recurrent major depressive disorder (HCC) Stable on citalopram 20 mg daily  Rash -  New and persistent Plan: Betamethasone 0.05% cream to start due to delays in establishing care with dermatology Ambulatory referral to Dermatology   Follow Up Instructions: 2 months   I discussed the assessment and treatment plan with the patient. The patient was provided an opportunity to ask questions and all were answered. The patient agreed with the plan and demonstrated an understanding of the instructions.   The patient was advised to call back or seek an in-person evaluation if the symptoms worsen or if the condition fails to improve as anticipated.  I provided 10 minutes of telephone- visit time during this encounter.   Vevelyn Francois, NP

## 2021-06-09 ENCOUNTER — Telehealth (HOSPITAL_COMMUNITY): Payer: Self-pay | Admitting: Nurse Practitioner

## 2021-06-09 NOTE — Telephone Encounter (Signed)
Pt states she is completely out of this medication since Sunday Oxycodone HCl 10 MG TABS [id number]  . States Pharmacy needs Prior Auth  and sent fax twice   Pharmacy  [id number] [id number] Phone:  [phone] Fax:  [phone]

## 2021-06-10 NOTE — Telephone Encounter (Signed)
Prior Auth also initiated via [employer] for Betamethasone cream Confirmation TV:[id number] W

## 2021-06-10 NOTE — Telephone Encounter (Signed)
Oxycodone approved : Status:APPROVED  Effective Begin Date:06/10/2021 Effective End Date:12/07/2021

## 2021-06-10 NOTE — Telephone Encounter (Signed)
Prior Authorization initiated via [employer]. Confirmation #:[id number]

## 2021-06-11 NOTE — Telephone Encounter (Signed)
Betamethasone cream denied.

## 2021-06-19 ENCOUNTER — Other Ambulatory Visit: Payer: Self-pay

## 2021-06-19 ENCOUNTER — Emergency Department (HOSPITAL_COMMUNITY): Payer: Medicaid Other

## 2021-06-19 ENCOUNTER — Encounter (HOSPITAL_COMMUNITY): Payer: Self-pay | Admitting: Emergency Medicine

## 2021-06-19 ENCOUNTER — Telehealth (HOSPITAL_COMMUNITY): Payer: Self-pay | Admitting: *Deleted

## 2021-06-19 ENCOUNTER — Inpatient Hospital Stay (HOSPITAL_COMMUNITY)
Admission: EM | Admit: 2021-06-19 | Discharge: 2021-06-25 | DRG: 812 | Disposition: A | Discharge status: Home / Self Care | Payer: Medicaid Other | Attending: Internal Medicine | Admitting: Internal Medicine

## 2021-06-19 DIAGNOSIS — F419 Anxiety disorder, unspecified: Secondary | ICD-10-CM | POA: Diagnosis present

## 2021-06-19 DIAGNOSIS — G8929 Other chronic pain: Secondary | ICD-10-CM | POA: Diagnosis present

## 2021-06-19 DIAGNOSIS — D571 Sickle-cell disease without crisis: Secondary | ICD-10-CM

## 2021-06-19 DIAGNOSIS — Z8261 Family history of arthritis: Secondary | ICD-10-CM

## 2021-06-19 DIAGNOSIS — Z79891 Long term (current) use of opiate analgesic: Secondary | ICD-10-CM | POA: Diagnosis not present

## 2021-06-19 DIAGNOSIS — G894 Chronic pain syndrome: Secondary | ICD-10-CM | POA: Diagnosis present

## 2021-06-19 DIAGNOSIS — D638 Anemia in other chronic diseases classified elsewhere: Secondary | ICD-10-CM | POA: Diagnosis present

## 2021-06-19 DIAGNOSIS — Z841 Family history of disorders of kidney and ureter: Secondary | ICD-10-CM | POA: Diagnosis not present

## 2021-06-19 DIAGNOSIS — F331 Major depressive disorder, recurrent, moderate: Secondary | ICD-10-CM | POA: Diagnosis not present

## 2021-06-19 DIAGNOSIS — Z8249 Family history of ischemic heart disease and other diseases of the circulatory system: Secondary | ICD-10-CM

## 2021-06-19 DIAGNOSIS — Z20822 Contact with and (suspected) exposure to covid-19: Secondary | ICD-10-CM | POA: Diagnosis present

## 2021-06-19 DIAGNOSIS — Z832 Family history of diseases of the blood and blood-forming organs and certain disorders involving the immune mechanism: Secondary | ICD-10-CM

## 2021-06-19 DIAGNOSIS — F322 Major depressive disorder, single episode, severe without psychotic features: Secondary | ICD-10-CM | POA: Diagnosis present

## 2021-06-19 DIAGNOSIS — D5700 Hb-SS disease with crisis, unspecified: Principal | ICD-10-CM | POA: Diagnosis present

## 2021-06-19 DIAGNOSIS — M7918 Myalgia, other site: Secondary | ICD-10-CM | POA: Diagnosis present

## 2021-06-19 LAB — URINALYSIS, ROUTINE W REFLEX MICROSCOPIC
Bilirubin Urine: NEGATIVE
Bilirubin Urine: NEGATIVE
Glucose, UA: NEGATIVE mg/dL
Glucose, UA: NEGATIVE mg/dL
Hgb urine dipstick: NEGATIVE
Hgb urine dipstick: NEGATIVE
Ketones, ur: NEGATIVE mg/dL
Ketones, ur: NEGATIVE mg/dL
Leukocytes,Ua: NEGATIVE
Leukocytes,Ua: NEGATIVE
Nitrite: NEGATIVE
Nitrite: NEGATIVE
Protein, ur: NEGATIVE mg/dL
Protein, ur: NEGATIVE mg/dL
Specific Gravity, Urine: 1.006 (ref 1.005–1.030)
Specific Gravity, Urine: 1.012 (ref 1.005–1.030)
pH: 6 (ref 5.0–8.0)
pH: 5 (ref 5.0–8.0)

## 2021-06-19 LAB — CBC WITH DIFFERENTIAL/PLATELET
Abs Immature Granulocytes: 0.05 10*3/uL (ref 0.00–0.07)
Basophils Absolute: 0.1 10*3/uL (ref 0.0–0.1)
Basophils Relative: 1 %
Eosinophils Absolute: 0.2 10*3/uL (ref 0.0–0.5)
Eosinophils Relative: 2 %
HCT: 31 % — ABNORMAL LOW (ref 36.0–46.0)
Hemoglobin: 10.7 g/dL — ABNORMAL LOW (ref 12.0–15.0)
Immature Granulocytes: 1 %
Lymphocytes Relative: 32 %
Lymphs Abs: 3 10*3/uL (ref 0.7–4.0)
MCH: 30 pg (ref 26.0–34.0)
MCHC: 34.5 g/dL (ref 30.0–36.0)
MCV: 86.8 fL (ref 80.0–100.0)
Monocytes Absolute: 0.9 10*3/uL (ref 0.1–1.0)
Monocytes Relative: 9 %
Neutro Abs: 5.4 10*3/uL (ref 1.7–7.7)
Neutrophils Relative %: 55 %
Platelets: 595 10*3/uL — ABNORMAL HIGH (ref 150–400)
RBC: 3.57 MIL/uL — ABNORMAL LOW (ref 3.87–5.11)
RDW: 14 % (ref 11.5–15.5)
WBC: 9.6 10*3/uL (ref 4.0–10.5)
nRBC: 0.6 % — ABNORMAL HIGH (ref 0.0–0.2)

## 2021-06-19 LAB — RETICULOCYTES
Immature Retic Fract: 41.1 % — ABNORMAL HIGH (ref 2.3–15.9)
RBC.: 3.54 MIL/uL — ABNORMAL LOW (ref 3.87–5.11)
Retic Count, Absolute: 213.1 10*3/uL — ABNORMAL HIGH (ref 19.0–186.0)
Retic Ct Pct: 6 % — ABNORMAL HIGH (ref 0.4–3.1)

## 2021-06-19 LAB — COMPREHENSIVE METABOLIC PANEL
ALT: 16 U/L (ref 0–44)
AST: 21 U/L (ref 15–41)
Albumin: 4 g/dL (ref 3.5–5.0)
Alkaline Phosphatase: 52 U/L (ref 38–126)
Anion gap: 7 (ref 5–15)
BUN: 9 mg/dL (ref 6–20)
CO2: 28 mmol/L (ref 22–32)
Calcium: 9.5 mg/dL (ref 8.9–10.3)
Chloride: 105 mmol/L (ref 98–111)
Creatinine, Ser: 0.55 mg/dL (ref 0.44–1.00)
GFR, Estimated: >60 mL/min (ref >60)
Glucose, Bld: 91 mg/dL (ref 70–99)
Potassium: 4 mmol/L (ref 3.5–5.1)
Sodium: 140 mmol/L (ref 135–145)
Total Bilirubin: 0.9 mg/dL (ref 0.3–1.2)
Total Protein: 7.8 g/dL (ref 6.5–8.1)

## 2021-06-19 LAB — I-STAT BETA HCG BLOOD, ED (MC, WL, AP ONLY): I-stat hCG, quantitative: <5 m[IU]/mL (ref <5)

## 2021-06-19 LAB — RESP PANEL BY RT-PCR (FLU A&B, COVID) ARPGX2
Influenza A by PCR: NEGATIVE
Influenza B by PCR: NEGATIVE
SARS Coronavirus 2 by RT PCR: NEGATIVE

## 2021-06-19 MED ORDER — KETOROLAC TROMETHAMINE 15 MG/ML IJ SOLN
15.0000 mg | INTRAMUSCULAR | Status: AC
  Administered 2021-06-19: 15 mg via INTRAVENOUS
  Filled 2021-06-19: qty 1

## 2021-06-19 MED ORDER — SODIUM CHLORIDE 0.45 % IV SOLN
INTRAVENOUS | Status: DC
Start: 2021-06-19 — End: 2021-06-25

## 2021-06-19 MED ORDER — SODIUM CHLORIDE 0.9% FLUSH
9.0000 mL | INTRAVENOUS | Status: DC | PRN
  Administered 2021-06-24: 9 mL via INTRAVENOUS

## 2021-06-19 MED ORDER — HYDROMORPHONE 1 MG/ML IV SOLN
INTRAVENOUS | Status: DC
Start: 2021-06-19 — End: 2021-06-20
  Administered 2021-06-19: 11.5 mg via INTRAVENOUS
  Administered 2021-06-20: 9.5 mg via INTRAVENOUS
  Administered 2021-06-20: 2.5 mg via INTRAVENOUS
  Administered 2021-06-20: 12 mg via INTRAVENOUS
  Administered 2021-06-20: 5 mg via INTRAVENOUS
  Filled 2021-06-19 (×2): qty 30

## 2021-06-19 MED ORDER — DOXYLAMINE SUCCINATE (SLEEP) 25 MG PO TABS
25.0000 mg | ORAL_TABLET | Freq: Once | ORAL | Status: AC
  Administered 2021-06-19: 25 mg via ORAL
  Filled 2021-06-19: qty 1

## 2021-06-19 MED ORDER — MELATONIN 5 MG PO TABS
10.0000 mg | ORAL_TABLET | Freq: Once | ORAL | Status: AC
  Administered 2021-06-19: 10 mg via ORAL
  Filled 2021-06-19: qty 2

## 2021-06-19 MED ORDER — HYDROMORPHONE HCL 2 MG/ML IJ SOLN
2.0000 mg | INTRAMUSCULAR | Status: AC
  Administered 2021-06-19: 2 mg via INTRAVENOUS
  Filled 2021-06-19 (×2): qty 1

## 2021-06-19 MED ORDER — SENNOSIDES-DOCUSATE SODIUM 8.6-50 MG PO TABS
1.0000 | ORAL_TABLET | Freq: Two times a day (BID) | ORAL | Status: DC
  Administered 2021-06-19 – 2021-06-25 (×12): 1 via ORAL
  Filled 2021-06-19 (×12): qty 1

## 2021-06-19 MED ORDER — KETOROLAC TROMETHAMINE 15 MG/ML IJ SOLN
15.0000 mg | Freq: Four times a day (QID) | INTRAMUSCULAR | Status: AC
  Administered 2021-06-19 – 2021-06-24 (×20): 15 mg via INTRAVENOUS
  Filled 2021-06-19 (×21): qty 1

## 2021-06-19 MED ORDER — ONDANSETRON HCL 4 MG/2ML IJ SOLN
4.0000 mg | Freq: Four times a day (QID) | INTRAMUSCULAR | Status: DC | PRN
  Administered 2021-06-21 – 2021-06-22 (×3): 4 mg via INTRAVENOUS
  Filled 2021-06-19 (×3): qty 2

## 2021-06-19 MED ORDER — LACTATED RINGERS IV BOLUS
1000.0000 mL | Freq: Once | INTRAVENOUS | Status: AC
  Administered 2021-06-19: 1000 mL via INTRAVENOUS

## 2021-06-19 MED ORDER — NALOXONE HCL 0.4 MG/ML IJ SOLN: 0.4000 mg | INTRAMUSCULAR | Status: DC | PRN

## 2021-06-19 MED ORDER — HYDROMORPHONE HCL 2 MG/ML IJ SOLN
2.0000 mg | INTRAMUSCULAR | Status: AC
  Administered 2021-06-19: 2 mg via INTRAVENOUS

## 2021-06-19 MED ORDER — DIPHENHYDRAMINE HCL 25 MG PO CAPS
25.0000 mg | ORAL_CAPSULE | ORAL | Status: DC | PRN
Start: 2021-06-19 — End: 2021-06-25
  Administered 2021-06-22: 25 mg via ORAL
  Filled 2021-06-19: qty 1

## 2021-06-19 MED ORDER — KETAMINE HCL 50 MG/5ML IJ SOSY
0.3000 mg/kg | PREFILLED_SYRINGE | Freq: Once | INTRAMUSCULAR | Status: AC
  Administered 2021-06-19: 25 mg via INTRAVENOUS
  Filled 2021-06-19: qty 5

## 2021-06-19 MED ORDER — CITALOPRAM HYDROBROMIDE 20 MG PO TABS
20.0000 mg | ORAL_TABLET | Freq: Every day | ORAL | Status: DC
  Administered 2021-06-19 – 2021-06-25 (×7): 20 mg via ORAL
  Filled 2021-06-19 (×3): qty 1
  Filled 2021-06-19: qty 2
  Filled 2021-06-19 (×3): qty 1

## 2021-06-19 MED ORDER — CYCLOBENZAPRINE HCL 10 MG PO TABS
10.0000 mg | ORAL_TABLET | Freq: Three times a day (TID) | ORAL | Status: DC | PRN
  Administered 2021-06-19 (×2): 10 mg via ORAL
  Filled 2021-06-19 (×3): qty 1

## 2021-06-19 MED ORDER — POLYETHYLENE GLYCOL 3350 17 G PO PACK: 17.0000 g | PACK | Freq: Every day | ORAL | Status: DC | PRN

## 2021-06-19 MED ORDER — HYDROMORPHONE HCL 2 MG/ML IJ SOLN
2.0000 mg | INTRAMUSCULAR | Status: DC | PRN
  Administered 2021-06-19: 2 mg via INTRAVENOUS
  Filled 2021-06-19: qty 1

## 2021-06-19 MED ORDER — FOLIC ACID 1 MG PO TABS
1.0000 mg | ORAL_TABLET | Freq: Every day | ORAL | Status: DC
  Administered 2021-06-19 – 2021-06-25 (×7): 1 mg via ORAL
  Filled 2021-06-19 (×7): qty 1

## 2021-06-19 MED ORDER — MORPHINE SULFATE ER 15 MG PO TBCR
60.0000 mg | EXTENDED_RELEASE_TABLET | Freq: Two times a day (BID) | ORAL | Status: DC
  Administered 2021-06-19 – 2021-06-25 (×12): 60 mg via ORAL
  Filled 2021-06-19 (×12): qty 4

## 2021-06-19 MED ORDER — KETOROLAC TROMETHAMINE 15 MG/ML IJ SOLN: 15.0000 mg | Freq: Four times a day (QID) | INTRAMUSCULAR | Status: DC

## 2021-06-19 MED ORDER — OXYCODONE HCL 5 MG PO TABS
15.0000 mg | ORAL_TABLET | Freq: Once | ORAL | Status: AC
  Administered 2021-06-19: 15 mg via ORAL
  Filled 2021-06-19: qty 3

## 2021-06-19 NOTE — ED Notes (Signed)
Patient is ready for admission to 1610

## 2021-06-19 NOTE — ED Notes (Signed)
Due to room changes- I have sent Brand Males a secure message in regards to having a new room assignment

## 2021-06-19 NOTE — ED Provider Notes (Signed)
[facility] DEPT Provider Note   CSN: [id number] Arrival date & time: 06/19/21  [id number]     History Chief Complaint  Patient presents with   Sickle Cell Pain Crisis    Carmen [name] is a 30 y.o. female.   Sickle Cell Pain Crisis Associated symptoms: no chest pain, no cough, no fatigue, no fever, no headaches, no nausea, no shortness of breath, no sore throat, no vomiting and no wheezing   Patient presents for lower back pain.  History notable for sickle cell disease with history of sickle cell pain crises.  Most recent hospital admission for sickle cell pain was 1 month ago.  Today, she reports 5 days of pain in the bilateral upper and lower back.  Pain is progressed into the areas of her lateral hips.  She states that these locations and character are typical of her previous pain crises.  She denies any other areas of pain.  She denies any recent trauma.  She has not had any fevers, chills, shortness of breath, new onset cough, abdominal pain, vomiting, or diarrhea.  Last menstrual period was 9 days ago.  Treatment at home has included the following: Oxycodone 10 mg every 12, morphine sulfate 60 every 12, Excedrin, and ibuprofen.  Patient has not taken any medication today for analgesia.    Past Medical History:  Diagnosis Date   Anxiety    Headache(784.0)    Heart murmur    Sickle cell crisis ([place])    Syphilis 2015   Was diagnosed and received one injection of antibiotics   Thrombocytosis 11/22/2014     CBC Latest Ref Rng & Units 12/13/2018 12/11/2018 12/10/2018 WBC 4.0 - 10.5 K/uL 14.8(H) 13.2(H) 15.9(H) Hemoglobin 12.0 - 15.0 g/dL 8.2(L) 7.7(L) 8.1(L) Hematocrit 36.0 - 46.0 % 23.7(L) 23.2(L) 24.5(L) Platelets 150 - 400 K/uL 326 399 388      Patient Active Problem List   Diagnosis Date Noted   Moderate episode of recurrent major depressive disorder ([place]) 05/28/2021   Depression 05/22/2021   Tobacco abuse 05/21/2021   Sickle cell disease with crisis  ([place]) 03/23/2021   Acute pain of right shoulder    Sickle cell anemia with crisis ([place]) 02/16/2021   Acute chest syndrome ([place]) 01/18/2021   Community acquired pneumonia of right lower lobe of lung    Fever of unknown origin 10/14/2020   Sickle cell crisis ([place]) 03/31/2020   Bacterial vaginitis 01/04/2020   Acute cystitis without hematuria    Dysuria 12/31/2019   Urine leukocytes    Sickle cell pain crisis ([place]) 09/10/2019   Sickle cell disease ([place]) 12/04/2018   Alpha thalassemia silent carrier 12/04/2018   Chronic prescription opiate use 03/13/2018   Chronic musculoskeletal pain 03/13/2018   Vitamin D deficiency 01/13/2018   Leukocytosis 08/21/2017   Hypokalemia 06/27/2017   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[place]) 04/05/2017   Hb-SS disease without crisis ([place]) 08/15/2016   Anemia of chronic disease    Chest pain [id number]   Systolic ejection murmur [id number]   Sickle cell anemia of mother during pregnancy ([place]) 05/16/2014    Past Surgical History:  Procedure Laterality Date   CESAREAN SECTION N/A 02/05/2019   Procedure: CESAREAN SECTION;  Surgeon: [name], MD;  Location: [facility] LD ORS;  Service: Obstetrics;  Laterality: N/A;   CHOLECYSTECTOMY N/A 11/30/2014   Procedure: LAPAROSCOPIC CHOLECYSTECTOMY SINGLE SITE WITH INTRAOPERATIVE CHOLANGIOGRAM;  Surgeon: Michael Boston, MD;  Location: [facility] ORS;  Service: General;  Laterality: N/A;  SPLENECTOMY       OB History     Gravida  2   Para  1   Term  1   Preterm      AB  1   Living  1      SAB      IAB  1   Ectopic      Multiple  0   Live Births  1           Family History  Problem Relation Age of Onset   Hypertension Mother    Sickle cell anemia Sister    Kidney disease Sister        Lupus   Arthritis Sister    Sickle cell anemia Sister    Sickle cell trait Sister    Heart disease Maternal Aunt        CABG   Heart disease Maternal Uncle        CABG   Lupus Sister     Social History    Tobacco Use   Smoking status: Some Days    Types: Cigarettes   Smokeless tobacco: Never  Vaping Use   Vaping Use: Never used  Substance Use Topics   Alcohol use: No   Drug use: No    Home Medications Prior to Admission medications   Medication Sig Start Date End Date Taking? Authorizing Provider  Aspirin-Acetaminophen-Caffeine (EXCEDRIN PO) Take 1-2 tablets by mouth every 6 (six) hours as needed (pain).   Yes [provider]  citalopram (CELEXA) 20 MG tablet TAKE 1 TABLET BY MOUTH EVERY DAY Patient taking differently: Take by mouth daily. 02/24/21  Yes [name], FNP  cyclobenzaprine (FLEXERIL) 10 MG tablet TAKE 1 TABLET BY MOUTH TWICE A DAY Patient taking differently: Take 10 mg by mouth in the morning and at bedtime. 03/25/21  Yes Vevelyn Francois, NP  folic acid (FOLVITE) 1 MG tablet Take 1 tablet (1 mg total) by mouth daily. 03/15/19  Yes Lanae Boast, FNP  morphine (MS CONTIN) 60 MG 12 hr tablet Take 1 tablet (60 mg total) by mouth every 12 (twelve) hours. 05/25/21 06/24/21 Yes Vevelyn Francois, NP  Oxycodone HCl 10 MG TABS Take 1 tablet (10 mg total) by mouth every 6 (six) hours as needed for up to 15 days (pain). 06/07/21 06/22/21 Yes Vevelyn Francois, NP  Prenatal Vit-Fe Fumarate-FA (PRENATAL VITAMIN PLUS LOW IRON) 27-1 MG TABS TAKE 1 TABLET BY MOUTH EVERY DAY Patient taking differently: Take 1 tablet by mouth daily. 03/03/20  Yes Shelly Bombard, MD  Vitamin D, Ergocalciferol, (DRISDOL) 1.25 MG (50000 UNIT) CAPS capsule TAKE ONE CAPSULE BY MOUTH ONE TIME PER WEEK Patient taking differently: Take 50,000 Units by mouth every Wednesday. 09/03/20  Yes Vevelyn Francois, NP  augmented betamethasone dipropionate (DIPROLENE-AF) 0.05 % cream Apply topically 2 (two) times daily. Patient not taking: Reported on 06/19/2021 06/04/21   Vevelyn Francois, NP    Allergies    Ketamine  Review of Systems   Review of Systems  Constitutional:  Positive for activity change and appetite  change. Negative for chills, fatigue and fever.  HENT:  Negative for ear pain and sore throat.   Eyes:  Negative for pain and visual disturbance.  Respiratory:  Negative for cough, chest tightness, shortness of breath and wheezing.   Cardiovascular:  Negative for chest pain and palpitations.  Gastrointestinal:  Negative for abdominal pain, diarrhea, nausea and vomiting.  Genitourinary:  Negative for dysuria, hematuria and pelvic  pain.  Musculoskeletal:  Positive for back pain and myalgias. Negative for joint swelling and neck stiffness.  Skin:  Negative for color change and rash.  Neurological:  Negative for dizziness, seizures, syncope, weakness, numbness and headaches.  Psychiatric/Behavioral:  Negative for confusion and decreased concentration.   All other systems reviewed and are negative.  Physical Exam Updated Vital Signs BP 117/78 (BP Location: Left Arm)   Pulse 62   Temp 98.3 F (36.8 C) (Oral)   Resp 15   Ht '5\' 3"'$  (1.6 m)   Wt 85.3 kg   SpO2 99%   BMI 33.31 kg/m   Physical Exam Vitals and nursing note reviewed.  Constitutional:      General: She is not in acute distress.    Appearance: Normal appearance. She is well-developed. She is not ill-appearing, toxic-appearing or diaphoretic.  HENT:     Head: Normocephalic and atraumatic.     Right Ear: External ear normal.     Left Ear: External ear normal.     Nose: Nose normal.     Mouth/Throat:     Mouth: Mucous membranes are moist.     Pharynx: Oropharynx is clear.  Eyes:     General: Scleral icterus present.     Conjunctiva/sclera: Conjunctivae normal.  Cardiovascular:     Rate and Rhythm: Normal rate and regular rhythm.     Heart sounds: No murmur heard. Pulmonary:     Effort: Pulmonary effort is normal. No respiratory distress.     Breath sounds: Normal breath sounds. No wheezing.  Chest:     Chest wall: No tenderness.  Abdominal:     Palpations: Abdomen is soft.     Tenderness: There is no abdominal  tenderness. There is no guarding.  Musculoskeletal:        General: Tenderness (Diffuse in back) present. Normal range of motion.     Cervical back: Normal range of motion and neck supple. No rigidity.     Right lower leg: No edema.     Left lower leg: No edema.  Skin:    General: Skin is warm and dry.     Coloration: Skin is not jaundiced or pale.  Neurological:     General: No focal deficit present.     Mental Status: She is alert and oriented to person, place, and time.     Cranial Nerves: No cranial nerve deficit.     Sensory: No sensory deficit.     Motor: No weakness.     Coordination: Coordination normal.  Psychiatric:        Mood and Affect: Mood normal. Affect is tearful.        Speech: Speech normal.        Behavior: Behavior normal. Behavior is cooperative.    ED Results / Procedures / Treatments   Labs (all labs ordered are listed, but only abnormal results are displayed) Labs Reviewed  CBC WITH DIFFERENTIAL/PLATELET - Abnormal; Notable for the following components:      Result Value   RBC 3.57 (*)    Hemoglobin 10.7 (*)    HCT 31.0 (*)    Platelets 595 (*)    nRBC 0.6 (*)    All other components within normal limits  RETICULOCYTES - Abnormal; Notable for the following components:   Retic Ct Pct 6.0 (*)    RBC. 3.54 (*)    Retic Count, Absolute 213.1 (*)    Immature Retic Fract 41.1 (*)    All other components within normal  limits  URINALYSIS, ROUTINE W REFLEX MICROSCOPIC - Abnormal; Notable for the following components:   APPearance HAZY (*)    All other components within normal limits  RESP PANEL BY RT-PCR (FLU A&B, COVID) ARPGX2  COMPREHENSIVE METABOLIC PANEL  URINALYSIS, ROUTINE W REFLEX MICROSCOPIC  BASIC METABOLIC PANEL  CBC  I-STAT BETA HCG BLOOD, ED ([facility], [facility], [facility] ONLY)    EKG EKG Interpretation  Date/Time:  Friday June 19 2021 09:24:28 EDT Ventricular Rate:  77 PR Interval:  165 QRS Duration: 86 QT Interval:  365 QTC Calculation: 413 R  Axis:   40 Text Interpretation: Sinus rhythm RSR' in V1 or V2, probably normal variant Confirmed by Godfrey Pick [id number]) on 06/19/2021 9:27:18 AM  Radiology DG Chest Port 1 View  Result Date: 06/19/2021 CLINICAL DATA:  Sickle cell pain in the back. EXAM: PORTABLE CHEST 1 VIEW COMPARISON:  05/21/2021 FINDINGS: The heart size and mediastinal contours are within normal limits. Both lungs are clear. The visualized skeletal structures are unremarkable. IMPRESSION: Negative chest. Electronically Signed   By: Monte Fantasia M.D.   On: 06/19/2021 09:54    Procedures Procedures   Medications Ordered in ED Medications  morphine (MS CONTIN) 12 hr tablet 60 mg (60 mg Oral Not Given 06/19/21 1418)  citalopram (CELEXA) tablet 20 mg (20 mg Oral Given [id number])  folic acid (FOLVITE) tablet 1 mg (1 mg Oral Given 06/19/21 1418)  cyclobenzaprine (FLEXERIL) tablet 10 mg (10 mg Oral Given 06/19/21 1418)  senna-docusate (Senokot-S) tablet 1 tablet (1 tablet Oral Not Given 06/19/21 1419)  polyethylene glycol (MIRALAX / GLYCOLAX) packet 17 g (has no administration in time range)  0.45 % sodium chloride infusion ( Intravenous Paused 06/19/21 1709)  naloxone (NARCAN) injection 0.4 mg (has no administration in time range)    And  sodium chloride flush (NS) 0.9 % injection 9 mL (has no administration in time range)  ondansetron (ZOFRAN) injection 4 mg (has no administration in time range)  diphenhydrAMINE (BENADRYL) capsule 25 mg (has no administration in time range)  HYDROmorphone (DILAUDID) 1 mg/mL PCA injection ( Intravenous Set-up / Initial Syringe 06/19/21 1526)  HYDROmorphone (DILAUDID) injection 2 mg (2 mg Intravenous Given 06/19/21 1419)  ketorolac (TORADOL) 15 MG/ML injection 15 mg (15 mg Intravenous Given 06/19/21 1531)  ketorolac (TORADOL) 15 MG/ML injection 15 mg (15 mg Intravenous Given 06/19/21 1006)  HYDROmorphone (DILAUDID) injection 2 mg (2 mg Intravenous Given 06/19/21 1127)  HYDROmorphone (DILAUDID)  injection 2 mg (2 mg Intravenous Given 06/19/21 1044)  ketamine 50 mg in normal saline 5 mL (10 mg/mL) syringe (25 mg Intravenous Given 06/19/21 1004)  oxyCODONE (Oxy IR/ROXICODONE) immediate release tablet 15 mg (15 mg Oral Given 06/19/21 1115)  lactated ringers bolus 1,000 mL (0 mLs Intravenous Stopped 06/19/21 1404)    ED Course  I have reviewed the triage vital signs and the nursing notes.  Pertinent labs & imaging results that were available during my care of the patient were reviewed by me and considered in my medical decision making (see chart for details).    MDM Rules/Calculators/A&P                           Patient presents for truncal myalgias that are consistent with her previous episodes of sickle cell pain crises.  Pain has been refractory to home analgesia.  On arrival, endorsing 10/10 severity pain.  Patient denies any other symptoms, including numbness, weakness, chest pain and shortness of breath.  Low suspicion for acute chest, ACS, stroke.  No focal neurologic deficits on exam.  Breathing is unlabored.  SPO2 is normal on room air.  Lungs are clear to auscultation.  She does have tenderness in the areas of her myalgias.  EKG is not concerning for cardiac ischemia.  Labs were obtained and patient was started on ED analgesia protocol.  Hemoglobin and reticulocyte count are at baseline.  Labs are otherwise unremarkable.  Following multiple courses of pain medication in the ED, patient continuing to endorse 9/10 severity pain.  Patient is amenable to admission for continued pain control.  Patient was admitted for ongoing management.  Final Clinical Impression(s) / ED Diagnoses Final diagnoses:  Sickle cell pain crisis [facility])    Rx / [place] Orders ED Discharge Orders     None        Godfrey Pick, MD 06/19/21 [phone]

## 2021-06-19 NOTE — ED Triage Notes (Signed)
Patient is ambulatory to room 24- patient reports that she is having a sickle cell crisis with pain to her lower back.  The patient reports that she called the Clinic but they told her that they were closed

## 2021-06-19 NOTE — H&P (Signed)
H&P  Patient Demographics:  Carmen [name], is a 30 y.o. female  MRN: [id number]   DOB - 1991/10/20  Admit Date - 06/19/2021  Outpatient Primary MD for the patient is Vevelyn Francois, NP  Chief Complaint  Patient presents with   Sickle Cell Pain Crisis      HPI:   Carmen [name]  is a 30 y.o. female in pain 30 year old female history significant for sickle cell disease, chronic pain syndrome, opiate dependence and tolerance, depression, and history of anemia of chronic disease presented to the ER plaints of low back pain over the past several days.  Current pain crisis to changes in weather.  She states that she awakened 2 days ago with severe low back pain.  She has been taking her home medications, which consists of oxycodone and MS Contin without very much relief.  Patient currently rates her pain as 9/10 characterized as constant and throbbing.  She denies any fever, chills, chest pain, shortness of breath, urinary symptoms, nausea, vomiting, or diarrhea.  He has no sick contacts and has not traveled recently.  She denies any known exposure to COVID-19.  ER course: While in the ER, patient's vital signs were stable.  BP 117/78, temperature 98.3 F, pulse 62, respirations 15, and oxygen saturation 99% on RA.  Complete blood count shows hemoglobin of 10.7, consistent with patient's baseline.  Otherwise CBC.  Comprehensive metabolic panel unremarkable.  COVID-19 test negative.  Persists despite IV fluids, IV Dilaudid, and therefore admitted to [facility] for further management of pain crisis.  The she be seen patient   Review of systems:  Review of Systems  Constitutional:  Negative for chills and fever.  HENT: Negative.    Eyes: Negative.   Respiratory: Negative.    Cardiovascular: Negative.   Gastrointestinal: Negative.   Genitourinary: Negative.   Musculoskeletal:  Positive for back pain.  Skin: Negative.   Neurological: Negative.   Psychiatric/Behavioral: Negative.     With Past History  of the following :   Past Medical History:  Diagnosis Date   Anxiety    Headache(784.0)    Heart murmur    Sickle cell crisis ([place])    Syphilis 2015   Was diagnosed and received one injection of antibiotics   Thrombocytosis 11/22/2014     CBC Latest Ref Rng & Units 12/13/2018 12/11/2018 12/10/2018 WBC 4.0 - 10.5 K/uL 14.8(H) 13.2(H) 15.9(H) Hemoglobin 12.0 - 15.0 g/dL 8.2(L) 7.7(L) 8.1(L) Hematocrit 36.0 - 46.0 % 23.7(L) 23.2(L) 24.5(L) Platelets 150 - 400 K/uL 326 399 388        Past Surgical History:  Procedure Laterality Date   CESAREAN SECTION N/A 02/05/2019   Procedure: CESAREAN SECTION;  Surgeon: [name], MD;  Location: [facility] LD ORS;  Service: Obstetrics;  Laterality: N/A;   CHOLECYSTECTOMY N/A 11/30/2014   Procedure: LAPAROSCOPIC CHOLECYSTECTOMY SINGLE SITE WITH INTRAOPERATIVE CHOLANGIOGRAM;  Surgeon: Michael Boston, MD;  Location: [facility] ORS;  Service: General;  Laterality: N/A;   SPLENECTOMY       Social History:   Social History   Tobacco Use   Smoking status: Some Days    Types: Cigarettes   Smokeless tobacco: Never  Substance Use Topics   Alcohol use: No     Lives - At home   Family History :   Family History  Problem Relation Age of Onset   Hypertension Mother    Sickle cell anemia Sister    Kidney disease Sister        Lupus  Arthritis Sister    Sickle cell anemia Sister    Sickle cell trait Sister    Heart disease Maternal Aunt        CABG   Heart disease Maternal Uncle        CABG   Lupus Sister      Home Medications:   Prior to Admission medications   Medication Sig Start Date End Date Taking? Authorizing Provider  augmented betamethasone dipropionate (DIPROLENE-AF) 0.05 % cream Apply topically 2 (two) times daily. 06/04/21   Vevelyn Francois, NP  citalopram (CELEXA) 20 MG tablet TAKE 1 TABLET BY MOUTH EVERY DAY Patient taking differently: Take by mouth daily. 02/24/21   [name], FNP  cyclobenzaprine (FLEXERIL) 10 MG tablet TAKE 1 TABLET  BY MOUTH TWICE A DAY Patient taking differently: Take 10 mg by mouth in the morning and at bedtime. 03/25/21   Vevelyn Francois, NP  folic acid (FOLVITE) 1 MG tablet Take 1 tablet (1 mg total) by mouth daily. 03/15/19   Lanae Boast, FNP  morphine (MS CONTIN) 60 MG 12 hr tablet Take 1 tablet (60 mg total) by mouth every 12 (twelve) hours. 05/25/21 06/24/21  Vevelyn Francois, NP  Oxycodone HCl 10 MG TABS Take 1 tablet (10 mg total) by mouth every 6 (six) hours as needed for up to 15 days (pain). 06/07/21 06/22/21  Vevelyn Francois, NP  Prenatal Vit-Fe Fumarate-FA (PRENATAL VITAMIN PLUS LOW IRON) 27-1 MG TABS TAKE 1 TABLET BY MOUTH EVERY DAY Patient taking differently: Take 1 tablet by mouth daily. 03/03/20   Shelly Bombard, MD  Vitamin D, Ergocalciferol, (DRISDOL) 1.25 MG (50000 UNIT) CAPS capsule TAKE ONE CAPSULE BY MOUTH ONE TIME PER WEEK Patient taking differently: Take 50,000 Units by mouth every 7 (seven) days. 09/03/20   Vevelyn Francois, NP     Allergies:   No Known Allergies   Physical Exam:   Vitals:   Vitals:   06/19/21 1130 06/19/21 1145  BP: 102/62 108/60  Pulse: 65 63  Resp: 12 16  Temp:    SpO2: 99% 99%    Physical Exam: Constitutional: Patient appears well-developed and well-nourished. Not in obvious distress. HENT: Normocephalic, atraumatic, External right and left ear normal. Oropharynx is clear and moist.  Eyes: Conjunctivae and EOM are normal. PERRLA, no scleral icterus. Neck: Normal ROM. Neck supple. No JVD. No tracheal deviation. No thyromegaly. CVS: RRR, S1/S2 +, no murmurs, no gallops, no carotid bruit.  Pulmonary: Effort and breath sounds normal, no stridor, rhonchi, wheezes, rales.  Abdominal: Soft. BS +, no distension, tenderness, rebound or guarding.  Musculoskeletal: Normal range of motion. No edema and no tenderness.  Lymphadenopathy: No lymphadenopathy noted, cervical, inguinal or axillary Neuro: Alert. Normal reflexes, muscle tone coordination. No cranial  nerve deficit. Skin: Skin is warm and dry. No rash noted. Not diaphoretic. No erythema. No pallor. Psychiatric: Normal mood and affect. Behavior, judgment, thought content normal.   Data Review:   CBC Recent Labs  Lab 06/19/21 0950  WBC 9.6  HGB 10.7*  HCT 31.0*  PLT 595*  MCV 86.8  MCH 30.0  MCHC 34.5  RDW 14.0  LYMPHSABS 3.0  MONOABS 0.9  EOSABS 0.2  BASOSABS 0.1   ------------------------------------------------------------------------------------------------------------------  Chemistries  Recent Labs  Lab 06/19/21 0950  NA 140  K 4.0  CL 105  CO2 28  GLUCOSE 91  BUN 9  CREATININE 0.55  CALCIUM 9.5  AST 21  ALT 16  ALKPHOS 52  BILITOT 0.9   ------------------------------------------------------------------------------------------------------------------  estimated creatinine clearance is 107.4 mL/min (by C-G formula based on SCr of 0.55 mg/dL). ------------------------------------------------------------------------------------------------------------------ No results for input(s): TSH, T4TOTAL, T3FREE, THYROIDAB in the last 72 hours.  Invalid input(s): FREET3  Coagulation profile No results for input(s): INR, PROTIME in the last 168 hours. ------------------------------------------------------------------------------------------------------------------- No results for input(s): DDIMER in the last 72 hours. -------------------------------------------------------------------------------------------------------------------  Cardiac Enzymes No results for input(s): CKMB, TROPONINI, MYOGLOBIN in the last 168 hours.  Invalid input(s): CK ------------------------------------------------------------------------------------------------------------------ No results found for: BNP  ---------------------------------------------------------------------------------------------------------------  Urinalysis    Component Value Date/Time   COLORURINE YELLOW  06/19/2021 [place] 06/19/2021 0918   LABSPEC 1.006 06/19/2021 0918   PHURINE 6.0 06/19/2021 0918   GLUCOSEU NEGATIVE 06/19/2021 0918   HGBUR NEGATIVE 06/19/2021 0918   BILIRUBINUR NEGATIVE 06/19/2021 0918   BILIRUBINUR Negative 08/10/2019 1534   KETONESUR NEGATIVE 06/19/2021 0918   PROTEINUR NEGATIVE 06/19/2021 0918   UROBILINOGEN 1.0 08/10/2019 1534   UROBILINOGEN 1.0 01/12/2018 0943   NITRITE NEGATIVE 06/19/2021 0918   LEUKOCYTESUR NEGATIVE 06/19/2021 0918    ----------------------------------------------------------------------------------------------------------------   Imaging Results:    DG Chest Port 1 View  Result Date: 06/19/2021 CLINICAL DATA:  Sickle cell pain in the back. EXAM: PORTABLE CHEST 1 VIEW COMPARISON:  05/21/2021 FINDINGS: The heart size and mediastinal contours are within normal limits. Both lungs are clear. The visualized skeletal structures are unremarkable. IMPRESSION: Negative chest. Electronically Signed   By: Monte Fantasia M.D.   On: 06/19/2021 09:54     Assessment & Plan:  Principal Problem:   Sickle cell pain crisis ([place]) Active Problems:   Chronic prescription opiate use   Chronic musculoskeletal pain   Moderate episode of recurrent major depressive disorder ([facility])  Sickle cell disease with pain crisis: Admit to MedSurg.  Initiate IV Dilaudid PCA with settings of 0.5 mg, 10-minute lockout, and 3 mg/h. Toradol 15 mg IV every 6 hours Continue home medications, MS Contin 30 mg every 12 hours.  However, hold oxycodone, will restart as pain intensity improves. IV fluids, 0.45% saline at 100 mL/h. Monitor vital signs very closely, reevaluate pain scale regularly, and supplemental oxygen as needed Patient will be reevaluated for pain in the context of function and relationship to baseline as her care progresses.  Chronic pain syndrome: Continue home medications  Anemia of chronic disease: Hemoglobin 10.7, consistent with  patient's baseline.  There is no clinical indication for blood transfusion at this time.  Continue to follow closely.  History of depression, Continue Celexa.  Patient denies any suicidal homicidal intent today.  Monitor closely.    DVT Prophylaxis: SCDs  AM Labs Ordered, also please review Full Orders  Family Communication: Admission, patient's condition and plan of care including tests being ordered have been discussed with the patient who indicate understanding and agree with the plan and Code Status.  Code Status: Full Code  Consults called: None    Admission status: Inpatient    Time spent in minutes : 30 minutes  [place], MSN, FNP-C [facility] [address] [place],  [address] [phone]  06/19/2021 at 11:57 AM

## 2021-06-19 NOTE — Telephone Encounter (Signed)
Patient called requesting to come to the day hospital for sickle cell pain. Due to staffing, the day hospital is unable to accept additional patients. Patient advised and expresses an understanding.

## 2021-06-19 NOTE — ED Notes (Signed)
Patient continues to co of the pain medications not relieving her pain but the patient is on her  Cell phone and asking for something to drink.  No distress noted when checked frequently.

## 2021-06-19 NOTE — ED Notes (Signed)
Patient drinking juices and eating crackers.  Patient is aware of pending admission

## 2021-06-19 NOTE — ED Notes (Signed)
Keenan Bachelor, RN was notified of admission bed assignment.  Currently the room is being cleaned.

## 2021-06-20 DIAGNOSIS — F331 Major depressive disorder, recurrent, moderate: Secondary | ICD-10-CM

## 2021-06-20 DIAGNOSIS — M7918 Myalgia, other site: Secondary | ICD-10-CM

## 2021-06-20 DIAGNOSIS — Z79891 Long term (current) use of opiate analgesic: Secondary | ICD-10-CM | POA: Diagnosis not present

## 2021-06-20 DIAGNOSIS — D5700 Hb-SS disease with crisis, unspecified: Secondary | ICD-10-CM | POA: Diagnosis not present

## 2021-06-20 DIAGNOSIS — G8929 Other chronic pain: Secondary | ICD-10-CM

## 2021-06-20 LAB — CBC
HCT: 28.9 % — ABNORMAL LOW (ref 36.0–46.0)
Hemoglobin: 9.8 g/dL — ABNORMAL LOW (ref 12.0–15.0)
MCH: 29.7 pg (ref 26.0–34.0)
MCHC: 33.9 g/dL (ref 30.0–36.0)
MCV: 87.6 fL (ref 80.0–100.0)
Platelets: 411 10*3/uL — ABNORMAL HIGH (ref 150–400)
RBC: 3.3 MIL/uL — ABNORMAL LOW (ref 3.87–5.11)
RDW: 14.1 % (ref 11.5–15.5)
WBC: 11.1 10*3/uL — ABNORMAL HIGH (ref 4.0–10.5)
nRBC: 0.6 % — ABNORMAL HIGH (ref 0.0–0.2)

## 2021-06-20 LAB — BASIC METABOLIC PANEL
Anion gap: 10 (ref 5–15)
BUN: 11 mg/dL (ref 6–20)
CO2: 27 mmol/L (ref 22–32)
Calcium: 9.5 mg/dL (ref 8.9–10.3)
Chloride: 98 mmol/L (ref 98–111)
Creatinine, Ser: 0.49 mg/dL (ref 0.44–1.00)
GFR, Estimated: >60 mL/min (ref >60)
Glucose, Bld: 75 mg/dL (ref 70–99)
Potassium: 3.7 mmol/L (ref 3.5–5.1)
Sodium: 135 mmol/L (ref 135–145)

## 2021-06-20 MED ORDER — HYDROMORPHONE 1 MG/ML IV SOLN
INTRAVENOUS | Status: DC
  Administered 2021-06-20: 8.5 mg via INTRAVENOUS
  Administered 2021-06-20: 12 mg via INTRAVENOUS
  Administered 2021-06-20: 3.6 mg via INTRAVENOUS
  Administered 2021-06-21: 5.4 mg via INTRAVENOUS
  Administered 2021-06-21: 9 mg via INTRAVENOUS
  Administered 2021-06-21: 3 mg via INTRAVENOUS
  Administered 2021-06-21: 30 mL via INTRAVENOUS
  Administered 2021-06-21: 15.6 mg via INTRAVENOUS
  Administered 2021-06-21: 6 mg via INTRAVENOUS
  Administered 2021-06-21: 3.6 mg via INTRAVENOUS
  Administered 2021-06-22: 7.5 mg via INTRAVENOUS
  Administered 2021-06-22: 11.4 mg via INTRAVENOUS
  Administered 2021-06-22: 9.6 mg via INTRAVENOUS
  Administered 2021-06-22: 3 mg via INTRAVENOUS
  Administered 2021-06-22: 12.6 mg via INTRAVENOUS
  Administered 2021-06-22: 10.2 mg via INTRAVENOUS
  Administered 2021-06-22: 5.2 mg via INTRAVENOUS
  Administered 2021-06-23: 10.8 mg via INTRAVENOUS
  Administered 2021-06-23: 9 mg via INTRAVENOUS
  Administered 2021-06-23: 9.3 mg via INTRAVENOUS
  Administered 2021-06-23: 5.6 mg via INTRAVENOUS
  Administered 2021-06-23: 30 mg via INTRAVENOUS
  Administered 2021-06-24: 4.2 mg via INTRAVENOUS
  Administered 2021-06-24: 9 mg via INTRAVENOUS
  Administered 2021-06-24: 30 mg via INTRAVENOUS
  Administered 2021-06-24: 17.4 mg via INTRAVENOUS
  Administered 2021-06-24: 7.2 mg via INTRAVENOUS
  Administered 2021-06-24: 7.71 mg via INTRAVENOUS
  Administered 2021-06-24: 30 mg via INTRAVENOUS
  Administered 2021-06-24: 0.6 mg via INTRAVENOUS
  Administered 2021-06-25: 11.4 mg via INTRAVENOUS
  Administered 2021-06-25: 7.8 mg via INTRAVENOUS
  Filled 2021-06-20 (×8): qty 30

## 2021-06-20 MED ORDER — HYDROMORPHONE HCL 2 MG/ML IJ SOLN
2.0000 mg | INTRAMUSCULAR | Status: DC | PRN
Start: 2021-06-20 — End: 2021-06-21

## 2021-06-20 NOTE — Progress Notes (Signed)
Received VAST consult. Current PIV positional. No swelling,signs of infection noted.Patient denies any pain/tenderness. Assessed with u/s, poor venous access.Attempted x2 without success. Patient states,"you just stuck me for no reason."Notified primary RN.

## 2021-06-20 NOTE — Progress Notes (Signed)
Patient ID: Carmen [name], female   DOB: 1991-07-20, 30 y.o.   MRN: [id number] Subjective: Carmen [name] is a 30 year old female with a medical history significant for sickle cell disease, chronic pain syndrome, opiate dependence and tolerance, history of anemia of chronic disease, and history of depression that was admitted for sickle cell pain crisis.  Patient is complaining of significant pain in her lower extremities and lower back, she says she has not gotten any relief from her pain regimen.  She rates her pain at 9/10 this morning.  She has no other complaint.  She denies any fever, headache, cough, chest pain, shortness of breath, nausea, vomiting or diarrhea.  No urinary symptoms.  Objective:  Vital signs in last 24 hours:  Vitals:   06/20/21 0550 06/20/21 0800 06/20/21 0918 06/20/21 1158  BP: 121/75  127/79   Pulse: 63  61   Resp: '15 14 14 14  '$ Temp: 97.7 F (36.5 C)  97.8 F (36.6 C)   TempSrc: Oral  Oral   SpO2: 100% 100% 100% 100%  Weight:      Height:        Intake/Output from previous day:   Intake/Output Summary (Last 24 hours) at 06/20/2021 1308 Last data filed at 06/20/2021 0819 Gross per 24 hour  Intake 1534 ml  Output --  Net 1534 ml    Physical Exam: General: Alert, awake, oriented x3, in no acute distress.  HEENT: [place]/AT PEERL, EOMI Neck: Trachea midline,  no masses, no thyromegal,y no JVD, no carotid bruit OROPHARYNX:  Moist, No exudate/ erythema/lesions.  Heart: Regular rate and rhythm, without murmurs, rubs, gallops, PMI non-displaced, no heaves or thrills on palpation.  Lungs: Clear to auscultation, no wheezing or rhonchi noted. No increased vocal fremitus resonant to percussion  Abdomen: Soft, nontender, nondistended, positive bowel sounds, no masses no hepatosplenomegaly noted..  Neuro: No focal neurological deficits noted cranial nerves II through XII grossly intact. DTRs 2+ bilaterally upper and lower extremities. Strength 5 out of 5 in bilateral  upper and lower extremities. Musculoskeletal: No warm swelling or erythema around joints, no spinal tenderness noted. Psychiatric: Patient alert and oriented x3, good insight and cognition, good recent to remote recall. Lymph node survey: No cervical axillary or inguinal lymphadenopathy noted.  Lab Results:  Basic Metabolic Panel:    Component Value Date/Time   NA 135 06/20/2021 0546   NA 142 07/02/2020 1040   K 3.7 06/20/2021 0546   CL 98 06/20/2021 0546   CO2 27 06/20/2021 0546   BUN 11 06/20/2021 0546   BUN 8 07/02/2020 1040   CREATININE 0.49 06/20/2021 0546   GLUCOSE 75 06/20/2021 0546   CALCIUM 9.5 06/20/2021 0546   CBC:    Component Value Date/Time   WBC 11.1 (H) 06/20/2021 0546   HGB 9.8 (L) 06/20/2021 0546   HGB 9.8 (L) 07/02/2020 1040   HCT 28.9 (L) 06/20/2021 0546   HCT 28.9 (L) 07/02/2020 1040   PLT 411 (H) 06/20/2021 0546   PLT 628 (H) 07/02/2020 1040   MCV 87.6 06/20/2021 0546   MCV 88 07/02/2020 1040   NEUTROABS 5.4 06/19/2021 0950   NEUTROABS 3.0 07/02/2020 1040   LYMPHSABS 3.0 06/19/2021 0950   LYMPHSABS 3.5 (H) 07/02/2020 1040   MONOABS 0.9 06/19/2021 0950   EOSABS 0.2 06/19/2021 0950   EOSABS 0.2 07/02/2020 1040   BASOSABS 0.1 06/19/2021 0950   BASOSABS 0.1 07/02/2020 1040    Recent Results (from the past 240 hour(s))  Resp Panel by RT-PCR (  Flu A&B, Covid) Nasopharyngeal Swab     Status: None   Collection Time: 06/19/21  9:45 AM   Specimen: Nasopharyngeal Swab; Nasopharyngeal(NP) swabs in vial transport medium  Result Value Ref Range Status   SARS Coronavirus 2 by RT PCR NEGATIVE NEGATIVE Final    Comment: (NOTE) SARS-CoV-2 target nucleic acids are NOT DETECTED.  The SARS-CoV-2 RNA is generally detectable in upper respiratory specimens during the acute phase of infection. The lowest concentration of SARS-CoV-2 viral copies this assay can detect is 138 copies/mL. A negative result does not preclude SARS-Cov-2 infection and should not be used  as the sole basis for treatment or other patient management decisions. A negative result may occur with  improper specimen collection/handling, submission of specimen other than nasopharyngeal swab, presence of viral mutation(s) within the areas targeted by this assay, and inadequate number of viral copies(<138 copies/mL). A negative result must be combined with clinical observations, patient history, and epidemiological information. The expected result is Negative.  Fact Sheet for Patients:  EntrepreneurPulse.com.au  Fact Sheet for Healthcare Providers:  IncredibleEmployment.be  This test is no t yet approved or cleared by the [place] FDA and  has been authorized for detection and/or diagnosis of SARS-CoV-2 by FDA under an Emergency Use Authorization (EUA). This EUA will remain  in effect (meaning this test can be used) for the duration of the COVID-19 declaration under Section 564(b)(1) of the Act, 21 U.S.C.section 360bbb-3(b)(1), unless the authorization is terminated  or revoked sooner.       Influenza A by PCR NEGATIVE NEGATIVE Final   Influenza B by PCR NEGATIVE NEGATIVE Final    Comment: (NOTE) The Xpert Xpress SARS-CoV-2/FLU/RSV plus assay is intended as an aid in the diagnosis of influenza from Nasopharyngeal swab specimens and should not be used as a sole basis for treatment. Nasal washings and aspirates are unacceptable for Xpert Xpress SARS-CoV-2/FLU/RSV testing.  Fact Sheet for Patients: EntrepreneurPulse.com.au  Fact Sheet for Healthcare Providers: IncredibleEmployment.be  This test is not yet approved or cleared by the [place] FDA and has been authorized for detection and/or diagnosis of SARS-CoV-2 by FDA under an Emergency Use Authorization (EUA). This EUA will remain in effect (meaning this test can be used) for the duration of the COVID-19 declaration under Section 564(b)(1)  of the Act, 21 U.S.C. section 360bbb-3(b)(1), unless the authorization is terminated or revoked.  Performed at [facility], [place] [address], [place], [place] [address]     Studies/Results: DG Chest Port 1 View  Result Date: 06/19/2021 CLINICAL DATA:  Sickle cell pain in the back. EXAM: PORTABLE CHEST 1 VIEW COMPARISON:  05/21/2021 FINDINGS: The heart size and mediastinal contours are within normal limits. Both lungs are clear. The visualized skeletal structures are unremarkable. IMPRESSION: Negative chest. Electronically Signed   By: Monte Fantasia M.D.   On: 06/19/2021 09:54    Medications: Scheduled Meds:  citalopram  20 mg Oral Daily   folic acid  1 mg Oral Daily   HYDROmorphone   Intravenous Q4H   ketorolac  15 mg Intravenous Q6H   morphine  60 mg Oral Q12H   senna-docusate  1 tablet Oral BID   Continuous Infusions:  sodium chloride Stopped (06/19/21 1510)   PRN Meds:.cyclobenzaprine, diphenhydrAMINE, HYDROmorphone (DILAUDID) injection, naloxone **AND** sodium chloride flush, ondansetron (ZOFRAN) IV, polyethylene glycol  Consultants: None  Procedures: None  Antibiotics: None  Assessment/Plan: Principal Problem:   Sickle cell pain crisis ([place]) Active Problems:   Chronic prescription opiate use  Chronic musculoskeletal pain   Moderate episode of recurrent major depressive disorder (HCC)  Hb Sickle Cell Disease with crisis: Continue IVF 0.45% Saline @ 125 mls/hour, adjust weight based Dilaudid PCA to 0.6/10/4, continue IV Toradol 15 mg Q 6 H, restart and continue home medications.  Monitor vitals very closely, Re-evaluate pain scale regularly, 2 L of Oxygen by [place]. Sickle Cell Anemia: Her hemoglobin is stable at baseline today. There is no clinical indication for blood transfusion at this time.  We will monitor closely and transfuse as appropriate. Chronic pain Syndrome: Restart and continue home medications. Major depressive disorder: Patient  denies any suicidal ideations or thoughts. She is on escitalopram at home, will continue.  Patient counseled.  Code Status: Full Code Family Communication: N/A Disposition Plan: Not yet ready for discharge  Espiridion Supinski  If 7PM-7AM, please contact night-coverage.  06/20/2021, 1:08 PM  LOS: 1 day

## 2021-06-21 DIAGNOSIS — F331 Major depressive disorder, recurrent, moderate: Secondary | ICD-10-CM | POA: Diagnosis not present

## 2021-06-21 DIAGNOSIS — M7918 Myalgia, other site: Secondary | ICD-10-CM | POA: Diagnosis not present

## 2021-06-21 DIAGNOSIS — D5700 Hb-SS disease with crisis, unspecified: Secondary | ICD-10-CM | POA: Diagnosis not present

## 2021-06-21 DIAGNOSIS — Z79891 Long term (current) use of opiate analgesic: Secondary | ICD-10-CM | POA: Diagnosis not present

## 2021-06-21 LAB — CBC WITH DIFFERENTIAL/PLATELET
Abs Immature Granulocytes: 0.05 10*3/uL (ref 0.00–0.07)
Basophils Absolute: 0.1 10*3/uL (ref 0.0–0.1)
Basophils Relative: 0 %
Eosinophils Absolute: 0.5 10*3/uL (ref 0.0–0.5)
Eosinophils Relative: 4 %
HCT: 24.9 % — ABNORMAL LOW (ref 36.0–46.0)
Hemoglobin: 8.8 g/dL — ABNORMAL LOW (ref 12.0–15.0)
Immature Granulocytes: 0 %
Lymphocytes Relative: 40 %
Lymphs Abs: 4.9 10*3/uL — ABNORMAL HIGH (ref 0.7–4.0)
MCH: 29.9 pg (ref 26.0–34.0)
MCHC: 35.3 g/dL (ref 30.0–36.0)
MCV: 84.7 fL (ref 80.0–100.0)
Monocytes Absolute: 1.2 10*3/uL — ABNORMAL HIGH (ref 0.1–1.0)
Monocytes Relative: 10 %
Neutro Abs: 5.4 10*3/uL (ref 1.7–7.7)
Neutrophils Relative %: 46 %
Platelets: 486 10*3/uL — ABNORMAL HIGH (ref 150–400)
RBC: 2.94 MIL/uL — ABNORMAL LOW (ref 3.87–5.11)
RDW: 14.3 % (ref 11.5–15.5)
WBC: 12.2 10*3/uL — ABNORMAL HIGH (ref 4.0–10.5)
nRBC: 0.3 % — ABNORMAL HIGH (ref 0.0–0.2)

## 2021-06-21 MED ORDER — OXYCODONE HCL 5 MG PO TABS
10.0000 mg | ORAL_TABLET | Freq: Four times a day (QID) | ORAL | Status: DC | PRN
  Administered 2021-06-22 – 2021-06-25 (×7): 10 mg via ORAL
  Filled 2021-06-21 (×7): qty 2

## 2021-06-21 MED ORDER — OXYCODONE HCL 5 MG PO TABS: 10.0000 mg | ORAL_TABLET | Freq: Four times a day (QID) | ORAL | Status: DC | PRN

## 2021-06-21 NOTE — Progress Notes (Signed)
Patient ID: Carmen [name], female   DOB: April 01, 1991, 30 y.o.   MRN: [id number] Subjective: Carmen [name] is a 30 year old female with a medical history significant for sickle cell disease, chronic pain syndrome, opiate dependence and tolerance, history of anemia of chronic disease, and history of depression that was admitted for sickle cell pain crisis.  Patient claims she feels slightly better but still in significant amount of pain in her lower back and lower extremities.  Her pain is now 8/10 in intensity, constant and throbbing.  She also has slight headache but no blurry vision.  No tingling or numbness.  She denies any fever, cough, chest pain, shortness of breath, nausea, vomiting or diarrhea.  No urinary symptoms.  Objective:  Vital signs in last 24 hours:  Vitals:   06/21/21 0547 06/21/21 0759 06/21/21 0924 06/21/21 1240  BP: 123/67  128/61   Pulse: 69  67   Resp: '16 12 14 13  '$ Temp: 97.8 F (36.6 C)  97.8 F (36.6 C)   TempSrc: Oral  Oral   SpO2: 96% 97% 95% 95%  Weight:      Height:        Intake/Output from previous day:   Intake/Output Summary (Last 24 hours) at 06/21/2021 1321 Last data filed at 06/21/2021 0800 Gross per 24 hour  Intake 477 ml  Output --  Net 477 ml     Physical Exam: General: Alert, awake, oriented x3, in no acute distress.  HEENT: [place]/AT [place]ER[place], EOMI Neck: Trachea midline,  no masses, no thyromegal,y no JVD, no carotid bruit OROPHARYNX:  Moist, No exudate/ erythema/lesions.  Heart: Regular rate and rhythm, without murmurs, rubs, gallops, PMI non-displaced, no heaves or thrills on palpation.  Lungs: Clear to auscultation, no wheezing or rhonchi noted. No increased vocal fremitus resonant to percussion  Abdomen: Soft, nontender, nondistended, positive bowel sounds, no masses no hepatosplenomegaly noted..  Neuro: No focal neurological deficits noted cranial nerves II through XII grossly intact. DTRs 2+ bilaterally upper and lower extremities.  Strength 5 out of 5 in bilateral upper and lower extremities. Musculoskeletal: No warm swelling or erythema around joints, no spinal tenderness noted. Psychiatric: Patient alert and oriented x3, good insight and cognition, good recent to remote recall. Lymph node survey: No cervical axillary or inguinal lymphadenopathy noted.  Lab Results:  Basic Metabolic Panel:    Component Value Date/Time   NA 135 06/20/2021 0546   NA 142 07/02/2020 1040   K 3.7 06/20/2021 0546   CL 98 06/20/2021 0546   CO2 27 06/20/2021 0546   BUN 11 06/20/2021 0546   BUN 8 07/02/2020 1040   CREATININE 0.49 06/20/2021 0546   GLUCOSE 75 06/20/2021 0546   CALCIUM 9.5 06/20/2021 0546   CBC:    Component Value Date/Time   WBC 12.2 (H) 06/21/2021 0530   HGB 8.8 (L) 06/21/2021 0530   HGB 9.8 (L) 07/02/2020 1040   HCT 24.9 (L) 06/21/2021 0530   HCT 28.9 (L) 07/02/2020 1040   PLT 486 (H) 06/21/2021 0530   PLT 628 (H) 07/02/2020 1040   MCV 84.7 06/21/2021 0530   MCV 88 07/02/2020 1040   NEUTROABS 5.4 06/21/2021 0530   NEUTROABS 3.0 07/02/2020 1040   LYMPHSABS 4.9 (H) 06/21/2021 0530   LYMPHSABS 3.5 (H) 07/02/2020 1040   MONOABS 1.2 (H) 06/21/2021 0530   EOSABS 0.5 06/21/2021 0530   EOSABS 0.2 07/02/2020 1040   BASOSABS 0.1 06/21/2021 0530   BASOSABS 0.1 07/02/2020 1040    Recent Results (from the  past 240 hour(s))  Resp Panel by RT-PCR (Flu A&B, Covid) Nasopharyngeal Swab     Status: None   Collection Time: 06/19/21  9:45 AM   Specimen: Nasopharyngeal Swab; Nasopharyngeal(NP) swabs in vial transport medium  Result Value Ref Range Status   SARS Coronavirus 2 by RT PCR NEGATIVE NEGATIVE Final    Comment: (NOTE) SARS-CoV-2 target nucleic acids are NOT DETECTED.  The SARS-CoV-2 RNA is generally detectable in upper respiratory specimens during the acute phase of infection. The lowest concentration of SARS-CoV-2 viral copies this assay can detect is 138 copies/mL. A negative result does not preclude  SARS-Cov-2 infection and should not be used as the sole basis for treatment or other patient management decisions. A negative result may occur with  improper specimen collection/handling, submission of specimen other than nasopharyngeal swab, presence of viral mutation(s) within the areas targeted by this assay, and inadequate number of viral copies(<138 copies/mL). A negative result must be combined with clinical observations, patient history, and epidemiological information. The expected result is Negative.  Fact Sheet for Patients:  EntrepreneurPulse.com.au  Fact Sheet for Healthcare Providers:  IncredibleEmployment.be  This test is no t yet approved or cleared by the [place] FDA and  has been authorized for detection and/or diagnosis of SARS-CoV-2 by FDA under an Emergency Use Authorization (EUA). This EUA will remain  in effect (meaning this test can be used) for the duration of the COVID-19 declaration under Section 564(b)(1) of the Act, 21 U.S.C.section 360bbb-3(b)(1), unless the authorization is terminated  or revoked sooner.       Influenza A by PCR NEGATIVE NEGATIVE Final   Influenza B by PCR NEGATIVE NEGATIVE Final    Comment: (NOTE) The Xpert Xpress SARS-CoV-2/FLU/RSV plus assay is intended as an aid in the diagnosis of influenza from Nasopharyngeal swab specimens and should not be used as a sole basis for treatment. Nasal washings and aspirates are unacceptable for Xpert Xpress SARS-CoV-2/FLU/RSV testing.  Fact Sheet for Patients: EntrepreneurPulse.com.au  Fact Sheet for Healthcare Providers: IncredibleEmployment.be  This test is not yet approved or cleared by the [place] FDA and has been authorized for detection and/or diagnosis of SARS-CoV-2 by FDA under an Emergency Use Authorization (EUA). This EUA will remain in effect (meaning this test can be used) for the duration of  the COVID-19 declaration under Section 564(b)(1) of the Act, 21 U.S.C. section 360bbb-3(b)(1), unless the authorization is terminated or revoked.  Performed at [facility], [place] [address], [place], [place] [address]     Studies/Results: No results found.  Medications: Scheduled Meds:  citalopram  20 mg Oral Daily   folic acid  1 mg Oral Daily   HYDROmorphone   Intravenous Q4H   ketorolac  15 mg Intravenous Q6H   morphine  60 mg Oral Q12H   senna-docusate  1 tablet Oral BID   Continuous Infusions:  sodium chloride Stopped (06/19/21 1510)   PRN Meds:.cyclobenzaprine, diphenhydrAMINE, HYDROmorphone (DILAUDID) injection, naloxone **AND** sodium chloride flush, ondansetron (ZOFRAN) IV, polyethylene glycol  Consultants: None  Procedures: None  Antibiotics: None  Assessment/Plan: Principal Problem:   Sickle cell pain crisis (HCC) Active Problems:   Chronic prescription opiate use   Chronic musculoskeletal pain   Moderate episode of recurrent major depressive disorder (HCC)  Hb Sickle Cell Disease with crisis: Reduce IVF 0.45% Saline to 75 mls/hour, continue weight based Dilaudid PCA at current setting, continue IV Toradol 15 mg Q 6 H, continue home medications as ordered.  Monitor vitals very closely, Re-evaluate  pain scale regularly, 2 L of Oxygen by [place]. Sickle Cell Anemia: Hemoglobin continues to be stable at baseline. We will continue to monitor closely and transfuse as appropriate. Chronic pain Syndrome: Continue home medications. Major depressive disorder: Patient denies any suicidal ideations or thoughts. Continue home medications.  Code Status: Full Code Family Communication: N/A Disposition Plan: Not yet ready for discharge  Kelin Nixon  If 7PM-7AM, please contact night-coverage.  06/21/2021, 1:21 PM  LOS: 2 days

## 2021-06-22 LAB — CBC WITH DIFFERENTIAL/PLATELET
Abs Immature Granulocytes: 0.14 10*3/uL — ABNORMAL HIGH (ref 0.00–0.07)
Basophils Absolute: 0.1 10*3/uL (ref 0.0–0.1)
Basophils Relative: 1 %
Eosinophils Absolute: 0.6 10*3/uL — ABNORMAL HIGH (ref 0.0–0.5)
Eosinophils Relative: 5 %
HCT: 26.2 % — ABNORMAL LOW (ref 36.0–46.0)
Hemoglobin: 9.2 g/dL — ABNORMAL LOW (ref 12.0–15.0)
Immature Granulocytes: 1 %
Lymphocytes Relative: 35 %
Lymphs Abs: 4.5 10*3/uL — ABNORMAL HIGH (ref 0.7–4.0)
MCH: 30.2 pg (ref 26.0–34.0)
MCHC: 35.1 g/dL (ref 30.0–36.0)
MCV: 85.9 fL (ref 80.0–100.0)
Monocytes Absolute: 1.1 10*3/uL — ABNORMAL HIGH (ref 0.1–1.0)
Monocytes Relative: 9 %
Neutro Abs: 6.5 10*3/uL (ref 1.7–7.7)
Neutrophils Relative %: 49 %
Platelets: 407 10*3/uL — ABNORMAL HIGH (ref 150–400)
RBC: 3.05 MIL/uL — ABNORMAL LOW (ref 3.87–5.11)
RDW: 15.3 % (ref 11.5–15.5)
WBC: 12.9 10*3/uL — ABNORMAL HIGH (ref 4.0–10.5)
nRBC: 0.5 % — ABNORMAL HIGH (ref 0.0–0.2)

## 2021-06-22 LAB — BASIC METABOLIC PANEL
Anion gap: 10 (ref 5–15)
BUN: 14 mg/dL (ref 6–20)
CO2: 28 mmol/L (ref 22–32)
Calcium: 9.3 mg/dL (ref 8.9–10.3)
Chloride: 98 mmol/L (ref 98–111)
Creatinine, Ser: 0.5 mg/dL (ref 0.44–1.00)
GFR, Estimated: >60 mL/min (ref >60)
Glucose, Bld: 91 mg/dL (ref 70–99)
Potassium: 4.1 mmol/L (ref 3.5–5.1)
Sodium: 136 mmol/L (ref 135–145)

## 2021-06-22 MED ORDER — MELATONIN 3 MG PO TABS
3.0000 mg | ORAL_TABLET | Freq: Every day | ORAL | Status: DC
  Administered 2021-06-22 – 2021-06-24 (×3): 3 mg via ORAL
  Filled 2021-06-22 (×3): qty 1

## 2021-06-22 NOTE — Progress Notes (Signed)
Subjective: Patient is a 30 year old female with known history of sickle cell disease who was admitted with sickle cell painful crisis.  Patient has been on Dilaudid PCA and other regimens including Toradol and IV fluids.  Still complaining of 8 out of 10 pain.  Pain apparently has dropped down to her legs.  Denied any fever or chills no nausea vomiting or diarrhea.  Objective: Vital signs in last 24 hours: Temp:  [97.5 F (36.4 C)-98.6 F (37 C)] 97.8 F (36.6 C) (08/22 1510) Pulse Rate:  [74-91] 91 (08/22 1510) Resp:  [13-19] 19 (08/22 1535) BP: (105-147)/(60-94) 147/94 (08/22 1510) SpO2:  [93 %-100 %] 97 % (08/22 1535) Weight change:  Last BM Date: 06/21/21  Intake/Output from previous day: 08/21 0701 - 08/22 0700 In: 2004.5 [P.O.:951; I.V.:1053.5] Out: -  Intake/Output this shift: Total I/O In: 240 [P.O.:240] Out: -   General appearance: alert, cooperative, appears stated age, and no distress Head: Normocephalic, without obvious abnormality, atraumatic Neck: no adenopathy, no carotid bruit, no JVD, supple, symmetrical, trachea midline, and thyroid not enlarged, symmetric, no tenderness/mass/nodules Back: symmetric, no curvature. ROM normal. No CVA tenderness. Resp: clear to auscultation bilaterally Cardio: regular rate and rhythm, S1, S2 normal, no murmur, click, rub or gallop GI: soft, non-tender; bowel sounds normal; no masses,  no organomegaly Extremities: extremities normal, atraumatic, no cyanosis or edema Pulses: 2+ and symmetric Skin: Skin color, texture, turgor normal. No rashes or lesions Neurologic: Grossly normal  Lab Results: Recent Labs    06/21/21 0530 06/22/21 0738  WBC 12.2* 12.9*  HGB 8.8* 9.2*  HCT 24.9* 26.2*  PLT 486* 407*   BMET Recent Labs    06/20/21 0546 06/22/21 0738  NA 135 136  K 3.7 4.1  CL 98 98  CO2 27 28  GLUCOSE 75 91  BUN 11 14  CREATININE 0.49 0.50  CALCIUM 9.5 9.3    Studies/Results: No results  found.  Medications: I have reviewed the patient's current medications.  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  #1 sickle cell painful crisis: Patient is still complaining of 8 out of 10 pain.  Currently on IV Dilaudid PCA, IV Toradol 15 mg every 6 hours, chronic home medications as well as supportive care.  We will continue with treatment and adjust her medications another day.  She uses 45 mg of the Dilaudid in the last 24 hours.  Patient has 0.6 mg boluses with a lot of out.  10 minutes and a total of 4 mg/h.  We will leave it at this setting.  #2 anemia of chronic disease: H&H is stable at 9.2.  Continue to monitor.  #3 chronic pain syndrome: Continue home regimen.  Adjust dose  #4 major depressive disorder: Continue home medications  LOS: 3 days   Yeshua Stryker,LAWAL 06/22/2021, 3:43 PM

## 2021-06-23 MED ORDER — DIAZEPAM 5 MG PO TABS
5.0000 mg | ORAL_TABLET | Freq: Four times a day (QID) | ORAL | Status: DC | PRN
  Administered 2021-06-23 – 2021-06-25 (×3): 5 mg via ORAL
  Filled 2021-06-23 (×3): qty 1

## 2021-06-23 MED ORDER — PANTOPRAZOLE SODIUM 40 MG PO TBEC
40.0000 mg | DELAYED_RELEASE_TABLET | Freq: Every day | ORAL | Status: DC
  Administered 2021-06-23 – 2021-06-25 (×3): 40 mg via ORAL
  Filled 2021-06-23 (×3): qty 1

## 2021-06-23 MED ORDER — HYOSCYAMINE SULFATE 0.125 MG PO TBDP
0.2500 mg | ORAL_TABLET | Freq: Four times a day (QID) | ORAL | Status: DC | PRN
  Administered 2021-06-25: 0.25 mg via SUBLINGUAL
  Filled 2021-06-23 (×2): qty 2

## 2021-06-23 NOTE — Progress Notes (Signed)
Subjective: Patient is complaining of abdominal pain and back pain.  Pain is said to be worsening overnight.  She did have some nausea but denied any diarrhea.  She is seem to be having spasms.  Patient said this has worsened her pain all over again.  She is in tears.  Also very anxious and appears to be depressed.  She has not been sleeping well.  Objective: Vital signs in last 24 hours: Temp:  [97.8 F (36.6 C)-98.8 F (37.1 C)] 98.8 F (37.1 C) (08/23 0429) Pulse Rate:  [79-91] 86 (08/23 0429) Resp:  [15-19] 18 (08/23 0833) BP: (124-147)/(77-94) 124/79 (08/23 0429) SpO2:  [91 %-97 %] 96 % (08/23 0833) FiO2 (%):  [0 %] 0 % (08/23 0833) Weight change:  Last BM Date: 06/23/21  Intake/Output from previous day: 08/22 0701 - 08/23 0700 In: 1162.5 [P.O.:480; I.V.:682.5] Out: -  Intake/Output this shift: No intake/output data recorded.  General appearance: alert, cooperative, appears stated age, and no distress Head: Normocephalic, without obvious abnormality, atraumatic Neck: no adenopathy, no carotid bruit, no JVD, supple, symmetrical, trachea midline, and thyroid not enlarged, symmetric, no tenderness/mass/nodules Back: symmetric, no curvature. ROM normal. No CVA tenderness. Resp: clear to auscultation bilaterally Cardio: regular rate and rhythm, S1, S2 normal, no murmur, click, rub or gallop GI: soft, non-tender; bowel sounds normal; no masses,  no organomegaly Extremities: extremities normal, atraumatic, no cyanosis or edema Pulses: 2+ and symmetric Skin: Skin color, texture, turgor normal. No rashes or lesions Neurologic: Grossly normal  Lab Results: Recent Labs    06/21/21 0530 06/22/21 0738  WBC 12.2* 12.9*  HGB 8.8* 9.2*  HCT 24.9* 26.2*  PLT 486* 407*    BMET Recent Labs    06/22/21 0738  NA 136  K 4.1  CL 98  CO2 28  GLUCOSE 91  BUN 14  CREATININE 0.50  CALCIUM 9.3     Studies/Results: No results found.  Medications: I have reviewed the patient's  current medications.  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  #1 sickle cell painful crisis: Patient is currently on IV Dilaudid PCA, IV Toradol 15 mg every 6 hours, chronic home medications as well as supportive care.  We will continue with treatment and adjust her medications another day.  Discussed with patient that her pain is being maximally treated at this point.  I will address the abdominal pain with hyoscyamine.  Also mobilize the patient.  Recheck her labs in the morning.  I will add muscle relaxant and antispasmodic with diazepam which will control her anxiety states..  #2 anemia of chronic disease: H&H is stable at 9.2.  Continue to monitor.  #3 chronic pain syndrome: Continue home regimen.  Adjust dose  #4 major depressive disorder: Patient has significant anxiety component.  I did diazepam 5 mg every 6 hours as needed.  Continue home medications   LOS: 4 days   Darvell Monteforte,LAWAL 06/23/2021, 9:21 AM

## 2021-06-24 LAB — CBC WITH DIFFERENTIAL/PLATELET
Abs Immature Granulocytes: 0.15 10*3/uL — ABNORMAL HIGH (ref 0.00–0.07)
Basophils Absolute: 0.1 10*3/uL (ref 0.0–0.1)
Basophils Relative: 1 %
Eosinophils Absolute: 0.7 10*3/uL — ABNORMAL HIGH (ref 0.0–0.5)
Eosinophils Relative: 6 %
HCT: 25.2 % — ABNORMAL LOW (ref 36.0–46.0)
Hemoglobin: 8.9 g/dL — ABNORMAL LOW (ref 12.0–15.0)
Immature Granulocytes: 1 %
Lymphocytes Relative: 26 %
Lymphs Abs: 2.9 10*3/uL (ref 0.7–4.0)
MCH: 30 pg (ref 26.0–34.0)
MCHC: 35.3 g/dL (ref 30.0–36.0)
MCV: 84.8 fL (ref 80.0–100.0)
Monocytes Absolute: 1.7 10*3/uL — ABNORMAL HIGH (ref 0.1–1.0)
Monocytes Relative: 15 %
Neutro Abs: 6 10*3/uL (ref 1.7–7.7)
Neutrophils Relative %: 51 %
Platelets: 455 10*3/uL — ABNORMAL HIGH (ref 150–400)
RBC: 2.97 MIL/uL — ABNORMAL LOW (ref 3.87–5.11)
RDW: 16.3 % — ABNORMAL HIGH (ref 11.5–15.5)
WBC: 11.5 10*3/uL — ABNORMAL HIGH (ref 4.0–10.5)
nRBC: 2.7 % — ABNORMAL HIGH (ref 0.0–0.2)

## 2021-06-24 LAB — COMPREHENSIVE METABOLIC PANEL
ALT: 26 U/L (ref 0–44)
AST: 43 U/L — ABNORMAL HIGH (ref 15–41)
Albumin: 4 g/dL (ref 3.5–5.0)
Alkaline Phosphatase: 55 U/L (ref 38–126)
Anion gap: 7 (ref 5–15)
BUN: 11 mg/dL (ref 6–20)
CO2: 28 mmol/L (ref 22–32)
Calcium: 9.4 mg/dL (ref 8.9–10.3)
Chloride: 101 mmol/L (ref 98–111)
Creatinine, Ser: 0.44 mg/dL (ref 0.44–1.00)
GFR, Estimated: >60 mL/min (ref >60)
Glucose, Bld: 88 mg/dL (ref 70–99)
Potassium: 5 mmol/L (ref 3.5–5.1)
Sodium: 136 mmol/L (ref 135–145)
Total Bilirubin: 1.8 mg/dL — ABNORMAL HIGH (ref 0.3–1.2)
Total Protein: 7.6 g/dL (ref 6.5–8.1)

## 2021-06-24 NOTE — Progress Notes (Signed)
Subjective: Patient is doing much better today.  She is not in tears anymore.  Pain is improved.  Denied fever or chills.  Denied abdominal pain.  Pain is down to 7 out of 10.  She is able to walk around and eat.  Objective: Vital signs in last 24 hours: Temp:  [97.9 F (36.6 C)-99.1 F (37.3 C)] 99.1 F (37.3 C) (08/24 0936) Pulse Rate:  [82-103] 103 (08/24 0936) Resp:  [12-20] 18 (08/24 0936) BP: (118-127)/(67-81) 127/78 (08/24 0936) SpO2:  [95 %-98 %] 97 % (08/24 0936) FiO2 (%):  [0 %] 0 % (08/24 0022) Weight change:  Last BM Date: 06/23/21  Intake/Output from previous day: 08/23 0701 - 08/24 0700 In: 2292.4 [P.O.:240; I.V.:2052.4] Out: -  Intake/Output this shift: No intake/output data recorded.  General appearance: alert, cooperative, appears stated age, and no distress Head: Normocephalic, without obvious abnormality, atraumatic Neck: no adenopathy, no carotid bruit, no JVD, supple, symmetrical, trachea midline, and thyroid not enlarged, symmetric, no tenderness/mass/nodules Back: symmetric, no curvature. ROM normal. No CVA tenderness. Resp: clear to auscultation bilaterally Cardio: regular rate and rhythm, S1, S2 normal, no murmur, click, rub or gallop GI: soft, non-tender; bowel sounds normal; no masses,  no organomegaly Extremities: extremities normal, atraumatic, no cyanosis or edema Pulses: 2+ and symmetric Skin: Skin color, texture, turgor normal. No rashes or lesions Neurologic: Grossly normal  Lab Results: Recent Labs    06/22/21 0738  WBC 12.9*  HGB 9.2*  HCT 26.2*  PLT 407*    BMET Recent Labs    06/22/21 0738  NA 136  K 4.1  CL 98  CO2 28  GLUCOSE 91  BUN 14  CREATININE 0.50  CALCIUM 9.3     Studies/Results: No results found.  Medications: I have reviewed the patient's current medications.  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  #1 sickle cell painful crisis: Patient will be  maintained on current Dilaudid regimen.  Also Toradol.  Hyoscyamine and diazepam seem to have helped.  We will mobilize patient today.  Plan on goal to discharge hopefully tomorrow if she continues this trend..  #2 anemia of chronic disease: H&H is stable and we will recheck today.  Continue to monitor.  #3 chronic pain syndrome: Continue home regimen.  Adjust dose  #4 major depressive disorder: Patient has significant anxiety component.  She is responding to additional diazepam yesterday.  We will continue.   LOS: 5 days   Mitsuye Schrodt,LAWAL 06/24/2021, 10:47 AM

## 2021-06-25 ENCOUNTER — Other Ambulatory Visit: Payer: Self-pay | Admitting: Nurse Practitioner

## 2021-06-25 MED ORDER — MORPHINE SULFATE ER 60 MG PO TBCR: 60.0000 mg | EXTENDED_RELEASE_TABLET | Freq: Two times a day (BID) | ORAL | 0 refills | Status: DC

## 2021-06-25 MED ORDER — BISMUTH SUBSALICYLATE 262 MG/15ML PO SUSP
30.0000 mL | Freq: Once | ORAL | Status: AC
  Administered 2021-06-25: 30 mL via ORAL
  Filled 2021-06-25: qty 236

## 2021-06-25 MED ORDER — OXYCODONE HCL 10 MG PO TABS: 10.0000 mg | ORAL_TABLET | Freq: Four times a day (QID) | ORAL | 0 refills | Status: DC | PRN

## 2021-06-25 NOTE — Discharge Summary (Signed)
Physician Discharge Summary  Patient ID: Carmen [name] MRN: [id number] DOB/AGE: [birth year] 30 y.o.  Admit date: 06/19/2021 Discharge date: 06/25/2021  Admission Diagnoses:  Discharge Diagnoses:  Principal Problem:   Sickle cell pain crisis ([place]) Active Problems:   Chronic prescription opiate use   Chronic musculoskeletal pain   Moderate episode of recurrent major depressive disorder [facility])   Discharged Condition: good  Hospital Course: Patient is a 30 year old female with known history of sickle cell disease chronic pain syndrome depression as well as chronic musculoskeletal pain who was admitted with sickle cell painful crisis.  She had pain at 10 out of 10.  Was also severely depressed.  Having a lot of abdominal and muscular pain and spasms.  Treated with IV Dilaudid PCA, Toradol and IV fluids.  Patient subsequently placed on hyoscyamine and diazepam for her anxiety and spasms.  Seems to have responded very well.  At this point she is controlled.  Pain is much better.  No fever no chills.  Patient will be discharged home on her home regimen.  Follow-up with PCP as soon as practicable.  Consults: None  Significant Diagnostic Studies: labs: Serial CBCs and CMP's.  Patient did not have any requirement for blood transfusion.  Treatments: IV hydration and analgesia: Dilaudid  Discharge Exam: Blood pressure 126/65, pulse 87, temperature 98.7 F (37.1 C), temperature source Oral, resp. rate 18, height '5\' 3"'$  (1.6 m), weight 85.3 kg, SpO2 96 %. General appearance: alert, cooperative, appears stated age, and no distress Back: symmetric, no curvature. ROM normal. No CVA tenderness. Resp: clear to auscultation bilaterally Cardio: regular rate and rhythm, S1, S2 normal, no murmur, click, rub or gallop GI: soft, non-tender; bowel sounds normal; no masses,  no organomegaly Extremities: extremities normal, atraumatic, no cyanosis or edema Pulses: 2+ and symmetric Neurologic: Grossly  normal  Disposition: Discharge disposition: 01-Home or Self Care       Discharge Instructions     Diet - low sodium heart healthy   Complete by: As directed    Increase activity slowly   Complete by: As directed       Allergies as of 06/25/2021       Reactions   Ketamine Hives, Other (See Comments)   Makes feel funny        Medication List     TAKE these medications    augmented betamethasone dipropionate 0.05 % cream Commonly known as: DIPROLENE-AF Apply topically 2 (two) times daily.   citalopram 20 MG tablet Commonly known as: CELEXA TAKE 1 TABLET BY MOUTH EVERY DAY What changed: how much to take   cyclobenzaprine 10 MG tablet Commonly known as: FLEXERIL TAKE 1 TABLET BY MOUTH TWICE A DAY What changed: when to take this   EXCEDRIN PO Take 1-2 tablets by mouth every 6 (six) hours as needed (pain).   folic acid 1 MG tablet Commonly known as: FOLVITE Take 1 tablet (1 mg total) by mouth daily.   morphine 60 MG 12 hr tablet Commonly known as: MS Contin Take 1 tablet (60 mg total) by mouth every 12 (twelve) hours.   Oxycodone HCl 10 MG Tabs Take 1 tablet (10 mg total) by mouth every 6 (six) hours as needed for up to 15 days (pain).   Prenatal Vitamin Plus Low Iron 27-1 MG Tabs TAKE 1 TABLET BY MOUTH EVERY DAY   Vitamin D (Ergocalciferol) 1.25 MG (50000 UNIT) Caps capsule Commonly known as: DRISDOL TAKE ONE CAPSULE BY MOUTH ONE TIME PER WEEK What changed:  how much to take how to take this when to take this additional instructions         Signed: Master Touchet,LAWAL 06/25/2021, 10:06 AM  Time spent 34 minutes

## 2021-06-26 NOTE — Telephone Encounter (Signed)
Patient needs RX from [place] for Morphine '60mg'$  and the RX was originally sent by Dr. Jonelle Sidle, but she is locked in with [facility] and she cannot be filled by Dr. Jonelle Sidle.

## 2021-06-29 ENCOUNTER — Other Ambulatory Visit: Payer: Self-pay

## 2021-06-29 ENCOUNTER — Telehealth (HOSPITAL_COMMUNITY): Payer: Self-pay

## 2021-06-29 ENCOUNTER — Inpatient Hospital Stay (HOSPITAL_COMMUNITY)
Admission: EM | Admit: 2021-06-29 | Discharge: 2021-07-01 | DRG: 812 | Disposition: A | Discharge status: Home / Self Care | Payer: Medicaid Other | Attending: Internal Medicine | Admitting: Internal Medicine

## 2021-06-29 ENCOUNTER — Other Ambulatory Visit: Payer: Self-pay | Admitting: Nurse Practitioner

## 2021-06-29 ENCOUNTER — Encounter (HOSPITAL_COMMUNITY): Payer: Self-pay

## 2021-06-29 DIAGNOSIS — Z884 Allergy status to anesthetic agent status: Secondary | ICD-10-CM

## 2021-06-29 DIAGNOSIS — Z832 Family history of diseases of the blood and blood-forming organs and certain disorders involving the immune mechanism: Secondary | ICD-10-CM

## 2021-06-29 DIAGNOSIS — G894 Chronic pain syndrome: Secondary | ICD-10-CM | POA: Diagnosis present

## 2021-06-29 DIAGNOSIS — F329 Major depressive disorder, single episode, unspecified: Secondary | ICD-10-CM | POA: Diagnosis not present

## 2021-06-29 DIAGNOSIS — D571 Sickle-cell disease without crisis: Secondary | ICD-10-CM

## 2021-06-29 DIAGNOSIS — F17210 Nicotine dependence, cigarettes, uncomplicated: Secondary | ICD-10-CM | POA: Diagnosis present

## 2021-06-29 DIAGNOSIS — D5700 Hb-SS disease with crisis, unspecified: Secondary | ICD-10-CM | POA: Diagnosis not present

## 2021-06-29 DIAGNOSIS — Z20822 Contact with and (suspected) exposure to covid-19: Secondary | ICD-10-CM | POA: Diagnosis present

## 2021-06-29 DIAGNOSIS — D638 Anemia in other chronic diseases classified elsewhere: Secondary | ICD-10-CM

## 2021-06-29 DIAGNOSIS — Z79899 Other long term (current) drug therapy: Secondary | ICD-10-CM

## 2021-06-29 DIAGNOSIS — F32A Depression, unspecified: Secondary | ICD-10-CM | POA: Diagnosis present

## 2021-06-29 DIAGNOSIS — Z79891 Long term (current) use of opiate analgesic: Secondary | ICD-10-CM

## 2021-06-29 DIAGNOSIS — F1120 Opioid dependence, uncomplicated: Secondary | ICD-10-CM | POA: Diagnosis present

## 2021-06-29 LAB — RESP PANEL BY RT-PCR (FLU A&B, COVID) ARPGX2
Influenza A by PCR: NEGATIVE
Influenza B by PCR: NEGATIVE
SARS Coronavirus 2 by RT PCR: NEGATIVE

## 2021-06-29 LAB — CBC WITH DIFFERENTIAL/PLATELET
Abs Immature Granulocytes: 0.04 10*3/uL (ref 0.00–0.07)
Basophils Absolute: 0 10*3/uL (ref 0.0–0.1)
Basophils Relative: 1 %
Eosinophils Absolute: 0 10*3/uL (ref 0.0–0.5)
Eosinophils Relative: 0 %
HCT: 28.2 % — ABNORMAL LOW (ref 36.0–46.0)
Hemoglobin: 9.9 g/dL — ABNORMAL LOW (ref 12.0–15.0)
Immature Granulocytes: 1 %
Lymphocytes Relative: 32 %
Lymphs Abs: 2.8 10*3/uL (ref 0.7–4.0)
MCH: 30.1 pg (ref 26.0–34.0)
MCHC: 35.1 g/dL (ref 30.0–36.0)
MCV: 85.7 fL (ref 80.0–100.0)
Monocytes Absolute: 1.2 10*3/uL — ABNORMAL HIGH (ref 0.1–1.0)
Monocytes Relative: 13 %
Neutro Abs: 4.8 10*3/uL (ref 1.7–7.7)
Neutrophils Relative %: 53 %
Platelets: 608 10*3/uL — ABNORMAL HIGH (ref 150–400)
RBC: 3.29 MIL/uL — ABNORMAL LOW (ref 3.87–5.11)
RDW: 14.6 % (ref 11.5–15.5)
WBC: 8.8 10*3/uL (ref 4.0–10.5)
nRBC: 0.5 % — ABNORMAL HIGH (ref 0.0–0.2)

## 2021-06-29 LAB — COMPREHENSIVE METABOLIC PANEL
ALT: 20 U/L (ref 0–44)
AST: 20 U/L (ref 15–41)
Albumin: 4.8 g/dL (ref 3.5–5.0)
Alkaline Phosphatase: 50 U/L (ref 38–126)
Anion gap: 7 (ref 5–15)
BUN: 12 mg/dL (ref 6–20)
CO2: 24 mmol/L (ref 22–32)
Calcium: 9.8 mg/dL (ref 8.9–10.3)
Chloride: 113 mmol/L — ABNORMAL HIGH (ref 98–111)
Creatinine, Ser: 0.68 mg/dL (ref 0.44–1.00)
GFR, Estimated: >60 mL/min (ref >60)
Glucose, Bld: 97 mg/dL (ref 70–99)
Potassium: 3.6 mmol/L (ref 3.5–5.1)
Sodium: 144 mmol/L (ref 135–145)
Total Bilirubin: 1.1 mg/dL (ref 0.3–1.2)
Total Protein: 9 g/dL — ABNORMAL HIGH (ref 6.5–8.1)

## 2021-06-29 LAB — RETICULOCYTES
Immature Retic Fract: 25.7 % — ABNORMAL HIGH (ref 2.3–15.9)
RBC.: 3.27 MIL/uL — ABNORMAL LOW (ref 3.87–5.11)
Retic Count, Absolute: 202.7 10*3/uL — ABNORMAL HIGH (ref 19.0–186.0)
Retic Ct Pct: 6.2 % — ABNORMAL HIGH (ref 0.4–3.1)

## 2021-06-29 LAB — I-STAT BETA HCG BLOOD, ED (MC, WL, AP ONLY): I-stat hCG, quantitative: <5 m[IU]/mL (ref <5)

## 2021-06-29 MED ORDER — SENNOSIDES-DOCUSATE SODIUM 8.6-50 MG PO TABS
1.0000 | ORAL_TABLET | Freq: Two times a day (BID) | ORAL | Status: DC
  Administered 2021-06-29 – 2021-07-01 (×4): 1 via ORAL
  Filled 2021-06-29 (×4): qty 1

## 2021-06-29 MED ORDER — HYDROMORPHONE HCL 1 MG/ML IJ SOLN
0.5000 mg | Freq: Once | INTRAMUSCULAR | Status: AC
Start: 2021-06-29 — End: 2021-06-29
  Administered 2021-06-29: 0.5 mg via INTRAVENOUS
  Filled 2021-06-29: qty 1

## 2021-06-29 MED ORDER — DIPHENHYDRAMINE HCL 25 MG PO CAPS: 25.0000 mg | ORAL_CAPSULE | ORAL | Status: DC | PRN

## 2021-06-29 MED ORDER — MORPHINE SULFATE ER 15 MG PO TBCR
60.0000 mg | EXTENDED_RELEASE_TABLET | Freq: Two times a day (BID) | ORAL | Status: DC
Start: 2021-06-30 — End: 2021-07-01
  Administered 2021-06-30 – 2021-07-01 (×4): 60 mg via ORAL
  Filled 2021-06-29 (×4): qty 4

## 2021-06-29 MED ORDER — POLYETHYLENE GLYCOL 3350 17 G PO PACK: 17.0000 g | PACK | Freq: Every day | ORAL | Status: DC | PRN

## 2021-06-29 MED ORDER — ONDANSETRON HCL 4 MG/2ML IJ SOLN
4.0000 mg | Freq: Once | INTRAMUSCULAR | Status: AC
  Administered 2021-06-29: 4 mg via INTRAVENOUS
  Filled 2021-06-29: qty 2

## 2021-06-29 MED ORDER — ENOXAPARIN SODIUM 40 MG/0.4ML IJ SOSY
40.0000 mg | PREFILLED_SYRINGE | INTRAMUSCULAR | Status: DC
  Administered 2021-06-29 – 2021-06-30 (×2): 40 mg via SUBCUTANEOUS
  Filled 2021-06-29 (×2): qty 0.4

## 2021-06-29 MED ORDER — SODIUM CHLORIDE 0.45 % IV SOLN: INTRAVENOUS | Status: DC

## 2021-06-29 MED ORDER — NALOXONE HCL 0.4 MG/ML IJ SOLN: 0.4000 mg | INTRAMUSCULAR | Status: DC | PRN

## 2021-06-29 MED ORDER — ONDANSETRON HCL 4 MG/2ML IJ SOLN: 4.0000 mg | Freq: Four times a day (QID) | INTRAMUSCULAR | Status: DC | PRN

## 2021-06-29 MED ORDER — MORPHINE SULFATE ER 60 MG PO TBCR: 60.0000 mg | EXTENDED_RELEASE_TABLET | Freq: Two times a day (BID) | ORAL | 0 refills | Status: DC

## 2021-06-29 MED ORDER — PRENATAL VITAMIN PLUS LOW IRON 27-1 MG PO TABS: 1.0000 | ORAL_TABLET | Freq: Every day | ORAL | Status: DC

## 2021-06-29 MED ORDER — CITALOPRAM HYDROBROMIDE 20 MG PO TABS
20.0000 mg | ORAL_TABLET | Freq: Every day | ORAL | Status: DC
  Administered 2021-06-30 – 2021-07-01 (×2): 20 mg via ORAL
  Filled 2021-06-29 (×2): qty 1

## 2021-06-29 MED ORDER — HYDROMORPHONE HCL 1 MG/ML IJ SOLN
1.0000 mg | Freq: Once | INTRAMUSCULAR | Status: AC
  Administered 2021-06-29: 1 mg via INTRAVENOUS
  Filled 2021-06-29: qty 1

## 2021-06-29 MED ORDER — HYDROMORPHONE HCL 1 MG/ML IJ SOLN
0.5000 mg | INTRAMUSCULAR | Status: DC | PRN
  Administered 2021-06-29: 0.5 mg via INTRAVENOUS
  Filled 2021-06-29: qty 1

## 2021-06-29 MED ORDER — SODIUM CHLORIDE 0.9 % IV SOLN
25.0000 mg | INTRAVENOUS | Status: DC | PRN
  Filled 2021-06-29: qty 0.5

## 2021-06-29 MED ORDER — SODIUM CHLORIDE 0.9% FLUSH: 9.0000 mL | INTRAVENOUS | Status: DC | PRN

## 2021-06-29 MED ORDER — COMPLETENATE 29-1 MG PO CHEW
1.0000 | CHEWABLE_TABLET | Freq: Every day | ORAL | Status: DC
  Administered 2021-06-30 – 2021-07-01 (×2): 1 via ORAL
  Filled 2021-06-29 (×2): qty 1

## 2021-06-29 MED ORDER — FOLIC ACID 1 MG PO TABS
1.0000 mg | ORAL_TABLET | Freq: Every day | ORAL | Status: DC
  Administered 2021-06-30 – 2021-07-01 (×2): 1 mg via ORAL
  Filled 2021-06-29 (×2): qty 1

## 2021-06-29 MED ORDER — KETOROLAC TROMETHAMINE 15 MG/ML IJ SOLN
15.0000 mg | Freq: Four times a day (QID) | INTRAMUSCULAR | Status: DC
  Administered 2021-06-29 – 2021-07-01 (×7): 15 mg via INTRAVENOUS
  Filled 2021-06-29 (×7): qty 1

## 2021-06-29 MED ORDER — HYDROMORPHONE 1 MG/ML IV SOLN
INTRAVENOUS | Status: DC
  Administered 2021-06-30: 3.5 mg via INTRAVENOUS
  Administered 2021-06-30: 30 mg via INTRAVENOUS
  Administered 2021-06-30: 6.5 mg via INTRAVENOUS
  Administered 2021-06-30 (×2): 3 mg via INTRAVENOUS
  Administered 2021-06-30: 4.5 mg via INTRAVENOUS
  Administered 2021-06-30: 3 mg via INTRAVENOUS
  Administered 2021-07-01: 3.5 mg via INTRAVENOUS
  Administered 2021-07-01: 4.7 mg via INTRAVENOUS
  Administered 2021-07-01: 5 mg via INTRAVENOUS
  Administered 2021-07-01: 30 mg via INTRAVENOUS
  Filled 2021-06-29 (×2): qty 30

## 2021-06-29 MED ORDER — HYDROMORPHONE HCL 1 MG/ML IJ SOLN
0.5000 mg | Freq: Once | INTRAMUSCULAR | Status: AC
  Administered 2021-06-29: 0.5 mg via INTRAVENOUS
  Filled 2021-06-29: qty 1

## 2021-06-29 MED ORDER — CYCLOBENZAPRINE HCL 10 MG PO TABS
10.0000 mg | ORAL_TABLET | Freq: Two times a day (BID) | ORAL | Status: DC
  Administered 2021-06-30 – 2021-07-01 (×4): 10 mg via ORAL
  Filled 2021-06-29 (×4): qty 1

## 2021-06-29 NOTE — H&P (Signed)
History and Physical    Carmen [name] [id number] DOB: 1991/04/19 DOA: 06/29/2021  PCP: Vevelyn Francois, NP  Patient coming from: Home  I have personally briefly reviewed patient's old medical records in [place]  Chief Complaint: back and leg pain  HPI: Carmen [name] is a 30 y.o. female with medical history significant for Sickle cell disease, chronic pain syndrome, opioid dependence and tolerance, depression, and history of anemia of chronic disease presenting with concerns of pain to her and legs for the past 2 weeks.  She was recently hospitalized from 8/19-8/25.  States when she got out she did not have her prescription of MS Contin 60 mg q12hr.  Has been trying to bear with the pain while taking her oxycodone 10 mg every 6 hours.  She tried to present to the sickle cell clinic today but was declined admission. States she is currently hurting all throughout her back and legs which is typical of her usual sickle cell pain crisis.  Denies any headache.  No chest pain or shortness of breath.  No fever.  No nausea, vomiting or diarrhea. Reports current tobacco use, denies alcohol or illicit drug use.  ED Course: She was afebrile, normotensive on room air.  No leukocytosis.  Hemoglobin stable at baseline of 9.9.  Platelet elevated at 608.  BMP otherwise unremarkable.  She was given 2.5 mg total of IV Dilaudid but continued to have pain so hospitalist was called for admission.  Review of Systems: Constitutional: No Weight Change, No Fever ENT/Mouth: No sore throat, No Rhinorrhea Eyes: No Eye Pain, No Vision Changes Cardiovascular: No Chest Pain, no SOB,  No Edema,  Respiratory: No Cough, No Sputum Gastrointestinal: No Nausea, No Vomiting, No Diarrhea, No Pain Genitourinary: no Urinary Incontinence, No Urgency, No Flank Pain Musculoskeletal: No Arthralgias, + Myalgias Skin: No Skin Lesions, No Pruritus, Neuro: no Weakness, No Numbness Psych: No Anxiety/Panic, No  Depression, no decrease appetite Heme/Lymph: No Bruising, No Bleeding  Past Medical History:  Diagnosis Date   Anxiety    Headache(784.0)    Heart murmur    Sickle cell crisis ([place])    Syphilis 2015   Was diagnosed and received one injection of antibiotics   Thrombocytosis 11/22/2014     CBC Latest Ref Rng & Units 12/13/2018 12/11/2018 12/10/2018 WBC 4.0 - 10.5 K/uL 14.8(H) 13.2(H) 15.9(H) Hemoglobin 12.0 - 15.0 g/dL 8.2(L) 7.7(L) 8.1(L) Hematocrit 36.0 - 46.0 % 23.7(L) 23.2(L) 24.5(L) Platelets 150 - 400 K/uL 326 399 388      Past Surgical History:  Procedure Laterality Date   CESAREAN SECTION N/A 02/05/2019   Procedure: CESAREAN SECTION;  Surgeon: [name], MD;  Location: [facility] LD ORS;  Service: Obstetrics;  Laterality: N/A;   CHOLECYSTECTOMY N/A 11/30/2014   Procedure: LAPAROSCOPIC CHOLECYSTECTOMY SINGLE SITE WITH INTRAOPERATIVE CHOLANGIOGRAM;  Surgeon: Michael Boston, MD;  Location: [facility] ORS;  Service: General;  Laterality: N/A;   SPLENECTOMY       reports that she has been smoking cigarettes. She has never used smokeless tobacco. She reports that she does not drink alcohol and does not use drugs. Social History  Allergies  Allergen Reactions   Ketamine Hives and Other (See Comments)    Makes feel funny    Family History  Problem Relation Age of Onset   Hypertension Mother    Sickle cell anemia Sister    Kidney disease Sister        Lupus   Arthritis Sister    Sickle cell  anemia Sister    Sickle cell trait Sister    Heart disease Maternal Aunt        CABG   Heart disease Maternal Uncle        CABG   Lupus Sister      Prior to Admission medications   Medication Sig Start Date End Date Taking? Authorizing Provider  Aspirin-Acetaminophen-Caffeine (EXCEDRIN PO) Take 1-2 tablets by mouth every 6 (six) hours as needed (pain).    [provider]  augmented betamethasone dipropionate (DIPROLENE-AF) 0.05 % cream Apply topically 2 (two) times daily. Patient not  taking: Reported on 06/19/2021 06/04/21   Vevelyn Francois, NP  citalopram (CELEXA) 20 MG tablet TAKE 1 TABLET BY MOUTH EVERY DAY Patient taking differently: Take by mouth daily. 02/24/21   [name], FNP  cyclobenzaprine (FLEXERIL) 10 MG tablet TAKE 1 TABLET BY MOUTH TWICE A DAY Patient taking differently: Take 10 mg by mouth in the morning and at bedtime. 03/25/21   Vevelyn Francois, NP  folic acid (FOLVITE) 1 MG tablet Take 1 tablet (1 mg total) by mouth daily. 03/15/19   Lanae Boast, FNP  morphine (MS CONTIN) 60 MG 12 hr tablet Take 1 tablet (60 mg total) by mouth every 12 (twelve) hours. 06/29/21 07/29/21  Vevelyn Francois, NP  Oxycodone HCl 10 MG TABS Take 1 tablet (10 mg total) by mouth every 6 (six) hours as needed for up to 15 days (pain). 06/25/21 07/10/21  Vevelyn Francois, NP  Prenatal Vit-Fe Fumarate-FA (PRENATAL VITAMIN PLUS LOW IRON) 27-1 MG TABS TAKE 1 TABLET BY MOUTH EVERY DAY Patient taking differently: Take 1 tablet by mouth daily. 03/03/20   Shelly Bombard, MD  Vitamin D, Ergocalciferol, (DRISDOL) 1.25 MG (50000 UNIT) CAPS capsule TAKE ONE CAPSULE BY MOUTH ONE TIME PER WEEK Patient taking differently: Take 50,000 Units by mouth every Wednesday. 09/03/20   Vevelyn Francois, NP    Physical Exam: Vitals:   06/29/21 1730 06/29/21 1825 06/29/21 1830 06/29/21 1900  BP: 106/67 112/66 113/65 117/78  Pulse: 64 72 73 76  Resp: '18 18 16 17  '$ Temp:      TempSrc:      SpO2: 100% 100% 100% 100%  Weight:      Height:        Constitutional: NAD, calm, comfortable, nontoxic appearing young female laying flat in Vitals:   06/29/21 1730 06/29/21 1825 06/29/21 1830 06/29/21 1900  BP: 106/67 112/66 113/65 117/78  Pulse: 64 72 73 76  Resp: '18 18 16 17  '$ Temp:      TempSrc:      SpO2: 100% 100% 100% 100%  Weight:      Height:       Eyes: PERRL, lids and conjunctivae normal ENMT: Mucous membranes are moist.  Neck: normal, supple Respiratory: clear to auscultation bilaterally, no  wheezing, no crackles. Normal respiratory effort. No accessory muscle use.  Cardiovascular: Regular rate and rhythm, no murmurs / rubs / gallops. No extremity edema. 2+ pedal pulses.  Abdomen: no tenderness, no masses palpated. Bowel sounds positive.  Back: No obvious deformities or step-off, no pain with palpation of the paraspinal musculature or mid spine Musculoskeletal: no clubbing / cyanosis. No joint deformity upper and lower extremities. Good ROM, no contractures. Normal muscle tone.  Skin: no rashes, lesions, ulcers. No induration Neurologic: CN 2-12 grossly intact. Sensation intact,  Strength 5/5 in all 4.  Psychiatric: Normal judgment and insight. Alert and oriented x 3. Normal mood.  Labs on Admission: I have personally reviewed following labs and imaging studies  CBC: Recent Labs  Lab 06/24/21 1125 06/29/21 1616  WBC 11.5* 8.8  NEUTROABS 6.0 4.8  HGB 8.9* 9.9*  HCT 25.2* 28.2*  MCV 84.8 85.7  PLT 455* [id number]   Basic Metabolic Panel: Recent Labs  Lab 06/24/21 1125 06/29/21 1616  NA 136 144  K 5.0 3.6  CL 101 113*  CO2 28 24  GLUCOSE 88 97  BUN 11 12  CREATININE 0.44 0.68  CALCIUM 9.4 9.8   GFR: Estimated Creatinine Clearance: 107.4 mL/min (by C-G formula based on SCr of 0.68 mg/dL). Liver Function Tests: Recent Labs  Lab 06/24/21 1125 06/29/21 1616  AST 43* 20  ALT 26 20  ALKPHOS 55 50  BILITOT 1.8* 1.1  PROT 7.6 9.0*  ALBUMIN 4.0 4.8   No results for input(s): LIPASE, AMYLASE in the last 168 hours. No results for input(s): AMMONIA in the last 168 hours. Coagulation Profile: No results for input(s): INR, PROTIME in the last 168 hours. Cardiac Enzymes: No results for input(s): CKTOTAL, CKMB, CKMBINDEX, TROPONINI in the last 168 hours. BNP (last 3 results) No results for input(s): PROBNP in the last 8760 hours. HbA1C: No results for input(s): HGBA1C in the last 72 hours. CBG: No results for input(s): GLUCAP in the last 168 hours. Lipid  Profile: No results for input(s): CHOL, HDL, LDLCALC, TRIG, CHOLHDL, LDLDIRECT in the last 72 hours. Thyroid Function Tests: No results for input(s): TSH, T4TOTAL, FREET4, T3FREE, THYROIDAB in the last 72 hours. Anemia Panel: Recent Labs    06/29/21 1616  RETICCTPCT 6.2*   Urine analysis:    Component Value Date/Time   COLORURINE YELLOW 06/19/2021 1707   APPEARANCEUR HAZY (A) 06/19/2021 1707   LABSPEC 1.012 06/19/2021 1707   PHURINE 5.0 06/19/2021 1707   GLUCOSEU NEGATIVE 06/19/2021 1707   HGBUR NEGATIVE 06/19/2021 1707   BILIRUBINUR NEGATIVE 06/19/2021 1707   BILIRUBINUR Negative 08/10/2019 [place] 06/19/2021 1707   PROTEINUR NEGATIVE 06/19/2021 1707   UROBILINOGEN 1.0 08/10/2019 1534   UROBILINOGEN 1.0 01/12/2018 0943   NITRITE NEGATIVE 06/19/2021 1707   LEUKOCYTESUR NEGATIVE 06/19/2021 1707    Radiological Exams on Admission: No results found.    Assessment/Plan  Sickle cell pain crisis -Initiate IV Dilaudid PCA with settings of 0.5 mg, 10-minute lockout, and 3 mg/h. -Toradol 15 mg IV every 6 hours -Continue home medications, MS Contin 30 mg every 12 hours.  Hold home oxycodone IV fluids, 0.45% saline at 100 mL/h. Monitor vital signs closely  Anemia of chronic disease -Hemoglobin 9.9, consistent with patient's baseline.  - no clinical indication for blood transfusion at this time.  Continue to follow closely.  History of depression Continue Celexa  DVT prophylaxis:.Lovenox Code Status: Full Family Communication: Plan discussed with patient at bedside  disposition Plan: Home with observation Consults called:  Admission status: Observation  Level of care: Med-Surg  Status is: Observation  The patient remains OBS appropriate and will d/c before 2 midnights.  Dispo: The patient is from: Home              Anticipated d/c is to: Home              Patient currently is not medically stable to d/c.   Difficult to place patient No          Orene Desanctis DO [facility]   If 7PM-7AM, please contact night-coverage [email or web address]   06/29/2021, 8:41 PM

## 2021-06-29 NOTE — Progress Notes (Signed)
    [facility] [address] [place], [place]  [address] Phone:  [phone]   Fax:  [phone] 

## 2021-06-29 NOTE — ED Provider Notes (Signed)
Emergency Medicine Provider Triage Evaluation Note  Carmen [name] , a 30 y.o. female  was evaluated in triage.  Pt complains of sickle cell pain that has been ongoing for a week. C/o pain all over.  Review of Systems  Positive: Pain all over Negative: Chest pain, sob, fevers  Physical Exam  BP 122/80 (BP Location: Left Arm)   Pulse 87   Temp 98.3 F (36.8 C) (Oral)   Resp 18   SpO2 99%  Gen:   Awake, no distress  Resp:  Normal effort  MSK:   Moves extremities without difficulty  Other:    Medical Decision Making  Medically screening exam initiated at 2:02 PM.  Appropriate orders placed.  Carmen [name] was informed that the remainder of the evaluation will be completed by another provider, this initial triage assessment does not replace that evaluation, and the importance of remaining in the ED until their evaluation is complete.     Bishop Dublin 06/29/21 1402    Lacretia Leigh, MD 06/30/21 [phone]

## 2021-06-29 NOTE — Telephone Encounter (Signed)
Patient called in. Complains of pain in back and shoulders rates 9/10. Denied chest pain, abd pain, fever, N/V/D. Wants to come in for treatment. Last took Oxycodone '10mg'$  this morning, pt states she was unable to pick up her long acting pain medication that was written on 8/25 due to an insurance issue with ordering provider.  Pt states she was d/c'd from hospital on 8/26. Pt states she uses the [employer] transportation service. Pt states she was not exposed to anyone covid positive in last two weeks, and had a negative test during hospital admission. Dr. Jonelle Sidle notified, pt not approved to come to day hospital. Dionisio David FNP will resend prescription with prior authorization for patient to pick up today. Pt instructed to pick up refill when ready and per Dr. Jonelle Sidle manage pain at home. Pt notified, verbalized understanding.

## 2021-06-29 NOTE — ED Triage Notes (Signed)
Patient c/o sickle cell pain of bilateral arms, legs, and upper, mid, and lower back x 2 weeks.

## 2021-06-29 NOTE — ED Provider Notes (Signed)
[place] DEPT Provider Note   CSN: [id number] Arrival date & time: 06/29/21  1145     History Chief Complaint  Patient presents with   Sickle Cell Pain Crisis    Carmen [name] is a 30 y.o. female with hx of sickle cell disease who presents with 2 weeks of bilateral arms, legs, and back pain.  States that she is out of her breakthrough MS Contin prescription and was unable to get a refill from her outpatient sickle cell provider.  Takes oxycodone at home without improvement in her pain.  States she called the sickle cell clinic today and they told her that they were unable to see her and directed her to the emergency department as her doctor had not yet filled her MS Contin prescription for breakthrough pain.  I personally read this patient's medical records.  She is history of sickle cell disease, heart murmur, and syphilis which has been treated.  HPI     Past Medical History:  Diagnosis Date   Anxiety    Headache(784.0)    Heart murmur    Sickle cell crisis ([place])    Syphilis 2015   Was diagnosed and received one injection of antibiotics   Thrombocytosis 11/22/2014     CBC Latest Ref Rng & Units 12/13/2018 12/11/2018 12/10/2018 WBC 4.0 - 10.5 K/uL 14.8(H) 13.2(H) 15.9(H) Hemoglobin 12.0 - 15.0 g/dL 8.2(L) 7.7(L) 8.1(L) Hematocrit 36.0 - 46.0 % 23.7(L) 23.2(L) 24.5(L) Platelets 150 - 400 K/uL 326 399 388      Patient Active Problem List   Diagnosis Date Noted   Moderate episode of recurrent major depressive disorder ([place]) 05/28/2021   Depression 05/22/2021   Tobacco abuse 05/21/2021   Sickle cell disease with crisis ([place]) 03/23/2021   Acute pain of right shoulder    Sickle cell anemia with crisis ([place]) 02/16/2021   Acute chest syndrome ([place]) 01/18/2021   Community acquired pneumonia of right lower lobe of lung    Fever of unknown origin 10/14/2020   Sickle cell crisis ([place]) 03/31/2020   Bacterial vaginitis 01/04/2020   Acute cystitis without  hematuria    Dysuria 12/31/2019   Urine leukocytes    Sickle cell pain crisis ([place]) 09/10/2019   Sickle cell disease ([place]) 12/04/2018   Alpha thalassemia silent carrier 12/04/2018   Chronic prescription opiate use 03/13/2018   Chronic musculoskeletal pain 03/13/2018   Vitamin D deficiency 01/13/2018   Leukocytosis 08/21/2017   Hypokalemia 06/27/2017   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[place]) 04/05/2017   Hb-SS disease without crisis ([place]) 08/15/2016   Anemia of chronic disease    Chest pain 99991111   Systolic ejection murmur [id number]   Sickle cell anemia of mother during pregnancy ([place]) 05/16/2014    Past Surgical History:  Procedure Laterality Date   CESAREAN SECTION N/A 02/05/2019   Procedure: CESAREAN SECTION;  Surgeon: [name], MD;  Location: [facility] LD [facility]S;  Service: Obstetrics;  Laterality: N/A;   CHOLECYSTECTOMY N/A 11/30/2014   Procedure: LAPAROSCOPIC CHOLECYSTECTOMY SINGLE SITE WITH INTRAOPERATIVE CHOLANGIOGRAM;  Surgeon: Michael Boston, MD;  Location: [facility] ORS;  Service: General;  Laterality: N/A;   SPLENECTOMY       OB History     Gravida  2   Para  1   Term  1   Preterm      AB  1   Living  1      SAB      IAB  1   Ectopic  Multiple  0   Live Births  1           Family History  Problem Relation Age of Onset   Hypertension Mother    Sickle cell anemia Sister    Kidney disease Sister        Lupus   Arthritis Sister    Sickle cell anemia Sister    Sickle cell trait Sister    Heart disease Maternal Aunt        CABG   Heart disease Maternal Uncle        CABG   Lupus Sister     Social History   Tobacco Use   Smoking status: Some Days    Types: Cigarettes   Smokeless tobacco: Never  Vaping Use   Vaping Use: Never used  Substance Use Topics   Alcohol use: No   Drug use: No    Home Medications Prior to Admission medications   Medication Sig Start Date End Date Taking? Authorizing Provider   Aspirin-Acetaminophen-Caffeine (EXCEDRIN PO) Take 1-2 tablets by mouth every 6 (six) hours as needed (pain).    [provider]  augmented betamethasone dipropionate (DIPROLENE-AF) 0.05 % cream Apply topically 2 (two) times daily. Patient not taking: Reported on 06/19/2021 06/04/21   Vevelyn Francois, NP  citalopram (CELEXA) 20 MG tablet TAKE 1 TABLET BY MOUTH EVERY DAY Patient taking differently: Take by mouth daily. 02/24/21   [name], FNP  cyclobenzaprine (FLEXERIL) 10 MG tablet TAKE 1 TABLET BY MOUTH TWICE A DAY Patient taking differently: Take 10 mg by mouth in the morning and at bedtime. 03/25/21   Vevelyn Francois, NP  folic acid (FOLVITE) 1 MG tablet Take 1 tablet (1 mg total) by mouth daily. 03/15/19   Lanae Boast, FNP  morphine (MS CONTIN) 60 MG 12 hr tablet Take 1 tablet (60 mg total) by mouth every 12 (twelve) hours. 06/29/21 07/29/21  Vevelyn Francois, NP  Oxycodone HCl 10 MG TABS Take 1 tablet (10 mg total) by mouth every 6 (six) hours as needed for up to 15 days (pain). 06/25/21 07/10/21  Vevelyn Francois, NP  Prenatal Vit-Fe Fumarate-FA (PRENATAL VITAMIN PLUS LOW IRON) 27-1 MG TABS TAKE 1 TABLET BY MOUTH EVERY DAY Patient taking differently: Take 1 tablet by mouth daily. 03/03/20   Shelly Bombard, MD  Vitamin D, Ergocalciferol, (DRISDOL) 1.25 MG (50000 UNIT) CAPS capsule TAKE ONE CAPSULE BY MOUTH ONE TIME PER WEEK Patient taking differently: Take 50,000 Units by mouth every Wednesday. 09/03/20   Vevelyn Francois, NP    Allergies    Ketamine  Review of Systems   Review of Systems  Constitutional: Negative.  Negative for chills and fever.  HENT: Negative.    Respiratory: Negative.  Negative for shortness of breath.   Cardiovascular: Negative.  Negative for chest pain.  Gastrointestinal: Negative.   Musculoskeletal:  Positive for myalgias. Negative for arthralgias.  Skin: Negative.   Neurological: Negative.    Physical Exam Updated Vital Signs BP 117/78 (BP  Location: Right Arm)   Pulse 76   Temp 98.3 F (36.8 C) (Oral)   Resp 17   Ht '5\' 3"'$  (1.6 m)   Wt 83.9 kg   LMP 06/10/2021   SpO2 100%   BMI 32.77 kg/m   Physical Exam Vitals and nursing note reviewed.  Constitutional:      Appearance: She is obese. She is not toxic-appearing.     Comments: Patient rates pain 10/10, calm, lying comfortably  in the bed, does not appear to be in acute distress  HENT:     Head: Normocephalic and atraumatic.     Nose: Nose normal. No congestion.     Mouth/Throat:     Mouth: Mucous membranes are moist.     Pharynx: Oropharynx is clear. Uvula midline. No oropharyngeal exudate or posterior oropharyngeal erythema.     Tonsils: No tonsillar exudate.  Eyes:     General: Lids are normal. Vision grossly intact.        Right eye: No discharge.        Left eye: No discharge.     Extraocular Movements: Extraocular movements intact.     Conjunctiva/sclera: Conjunctivae normal.     Pupils: Pupils are equal, round, and reactive to light.  Neck:     Trachea: Trachea and phonation normal.  Cardiovascular:     Rate and Rhythm: Normal rate and regular rhythm.     Pulses: Normal pulses.     Heart sounds: Normal heart sounds. No murmur heard. Pulmonary:     Effort: Pulmonary effort is normal. No tachypnea, bradypnea, accessory muscle usage, prolonged expiration or respiratory distress.     Breath sounds: Normal breath sounds. No wheezing or rales.  Chest:     Chest wall: No mass, lacerations, deformity, swelling, tenderness or crepitus.  Abdominal:     General: Bowel sounds are normal. There is no distension.     Palpations: Abdomen is soft.     Tenderness: There is no abdominal tenderness. There is no right CVA tenderness, left CVA tenderness, guarding or rebound.  Musculoskeletal:        General: No deformity.     Cervical back: Normal range of motion and neck supple. No edema, rigidity or crepitus. No pain with movement, spinous process tenderness or  muscular tenderness.     Right lower leg: No edema.     Left lower leg: No edema.     Comments: Dennis palpation of the mid and lower back without midline tenderness to palpation.  Tenderness with patient of the upper arms bilaterally as well as the legs bilaterally from the knees down.  2+ pedal pulses.  2+ radial pulses bilaterally.  Patient moving all 4 extremity spontaneously without difficulty.  Lymphadenopathy:     Cervical: No cervical adenopathy.  Skin:    General: Skin is warm and dry.     Capillary Refill: Capillary refill takes less than 2 seconds.  Neurological:     General: No focal deficit present.     Mental Status: She is alert and oriented to person, place, and time. Mental status is at baseline.  Psychiatric:        Mood and Affect: Mood normal.    ED Results / Procedures / Treatments   Labs (all labs ordered are listed, but only abnormal results are displayed) Labs Reviewed  CBC WITH DIFFERENTIAL/PLATELET - Abnormal; Notable for the following components:      Result Value   RBC 3.29 (*)    Hemoglobin 9.9 (*)    HCT 28.2 (*)    Platelets 608 (*)    nRBC 0.5 (*)    Monocytes Absolute 1.2 (*)    All other components within normal limits  COMPREHENSIVE METABOLIC PANEL - Abnormal; Notable for the following components:   Chloride 113 (*)    Total Protein 9.0 (*)    All other components within normal limits  RETICULOCYTES - Abnormal; Notable for the following components:   Retic Ct Pct  6.2 (*)    RBC. 3.27 (*)    Retic Count, Absolute 202.7 (*)    Immature Retic Fract 25.7 (*)    All other components within normal limits  RESP PANEL BY RT-PCR (FLU A&B, COVID) ARPGX2  HCG, QUANTITATIVE, PREGNANCY  I-STAT BETA HCG BLOOD, ED ([facility], [facility], [facility] ONLY)    EKG None  Radiology No results found.  Procedures Procedures   Medications Ordered in ED Medications  HYDROmorphone (DILAUDID) injection 0.5 mg (has no administration in time range)  HYDROmorphone (DILAUDID)  injection 0.5 mg (0.5 mg Intravenous Given 06/29/21 1628)  ondansetron (ZOFRAN) injection 4 mg (4 mg Intravenous Given 06/29/21 1627)  HYDROmorphone (DILAUDID) injection 1 mg (1 mg Intravenous Given 06/29/21 1742)  HYDROmorphone (DILAUDID) injection 1 mg (1 mg Intravenous Given 06/29/21 1853)    ED Course  I have reviewed the triage vital signs and the nursing notes.  Pertinent labs & imaging results that were available during my care of the patient were reviewed by me and considered in my medical decision making (see chart for details).  Clinical Course as of 06/29/21 1954  Mon Jun 29, 2021  1953 Consult to hospitalist Dr. Flossie Buffy, was agreeable to seeing this patient in the ER to her service. [RS]    Clinical Course User Index [RS] Carmen [name], Gypsy Balsam, PA-C   MDM Rules/Calculators/A&P                         30 year old female presents with concern for sickle cell crisis without chest pain, shortness of breath, or fevers.  Vital signs are normal on intake.  Cardiopulmonary exam is normal, abdominal exam is benign.  Patient with tenderness palpation of the mid lower back bilaterally as well as the upper arms and lower legs from the knee down bilaterally.  Patient is neurovascular intact in all 4 extremities.    Basic labs ordered in triage, will administer dose of analgesia and antiemetic and reevaluate.  No indication for chest x-ray given patient not having any chest pain or shortness of breath.  CBC with hemoglobin at baseline 9.9.  CMP unremarkable.  Reticulocyte count near baseline of 6.2 absolute reticulocyte count of 202.  Patient is not pregnant.    Unfortunately, patient's pain is not improved despite multiple rounds of antiemetic in the emergency department, will require admission to the hospital for pain management.  Consult to hospitalist, Dr. Flossie Buffy, who is agreeable to admitting this patient to her service.  Appreciate her collaboration of care of this patient.  No further work-up  warranted in the ED at this time.  Kilynn voiced understanding of her medical evaluation and treatment plan.  Each of her questions was answered to her expressed satisfaction.  She is amenable to plan for admission at this time.  This chart was dictated using voice recognition software, Dragon. Despite the best efforts of this provider to proofread and correct errors, errors may still occur which can change documentation meaning.  Final Clinical Impression(s) / ED Diagnoses Final diagnoses:  None    Rx / DC Orders ED Discharge Orders     None        Aura Dials 06/29/21 1954    Hayden Rasmussen, MD 06/30/21 1137

## 2021-06-30 ENCOUNTER — Telehealth: Payer: Self-pay

## 2021-06-30 DIAGNOSIS — Z832 Family history of diseases of the blood and blood-forming organs and certain disorders involving the immune mechanism: Secondary | ICD-10-CM | POA: Diagnosis not present

## 2021-06-30 DIAGNOSIS — Z20822 Contact with and (suspected) exposure to covid-19: Secondary | ICD-10-CM | POA: Diagnosis present

## 2021-06-30 DIAGNOSIS — F32A Depression, unspecified: Secondary | ICD-10-CM | POA: Diagnosis present

## 2021-06-30 DIAGNOSIS — F1120 Opioid dependence, uncomplicated: Secondary | ICD-10-CM | POA: Diagnosis present

## 2021-06-30 DIAGNOSIS — D5700 Hb-SS disease with crisis, unspecified: Secondary | ICD-10-CM | POA: Diagnosis present

## 2021-06-30 DIAGNOSIS — G894 Chronic pain syndrome: Secondary | ICD-10-CM | POA: Diagnosis present

## 2021-06-30 DIAGNOSIS — Z79899 Other long term (current) drug therapy: Secondary | ICD-10-CM | POA: Diagnosis not present

## 2021-06-30 DIAGNOSIS — Z884 Allergy status to anesthetic agent status: Secondary | ICD-10-CM | POA: Diagnosis not present

## 2021-06-30 DIAGNOSIS — F17210 Nicotine dependence, cigarettes, uncomplicated: Secondary | ICD-10-CM | POA: Diagnosis present

## 2021-06-30 LAB — HCG, QUANTITATIVE, PREGNANCY: hCG, Beta Chain, Quant, S: <1 m[IU]/mL (ref <5)

## 2021-06-30 MED ORDER — OXYCODONE HCL 5 MG PO TABS
10.0000 mg | ORAL_TABLET | ORAL | Status: DC | PRN
  Administered 2021-07-01 (×2): 10 mg via ORAL
  Filled 2021-06-30 (×2): qty 2

## 2021-06-30 NOTE — Plan of Care (Signed)
  Problem: Activity: Goal: Risk for activity intolerance will decrease Outcome: Progressing   Problem: Nutrition: Goal: Adequate nutrition will be maintained Outcome: Progressing   Problem: Pain Managment: Goal: General experience of comfort will improve Outcome: Progressing   Problem: Self-Care: Goal: Ability to incorporate actions that prevent/reduce pain crisis will improve Outcome: Progressing   Problem: Bowel/Gastric: Goal: Gut motility will be maintained Outcome: Progressing

## 2021-06-30 NOTE — Telephone Encounter (Signed)
Morphine prior auth initiated via [employer]racks Confirmation #:[id number] W

## 2021-06-30 NOTE — Progress Notes (Signed)
Subjective: Carmen [name] is a 30 year old female with a medical history significant for sickle cell disease, chronic pain syndrome, opiate dependence and tolerance, and history of depression was admitted for sickle cell pain crisis. Patient continues to complain of significant pain to lower back and lower extremities.  She rates her pain as 10/10.  She denies any headache, chest pain, shortness of breath, urinary symptoms, nausea, vomiting, or diarrhea.  Objective:  Vital signs in last 24 hours:  Vitals:   06/30/21 1955 06/30/21 2040 07/01/21 0123 07/01/21 0509  BP:  109/63 104/61 125/62  Pulse:  75 70 70  Resp: '15 17 14 18  '$ Temp:  98.4 F (36.9 C) 98.4 F (36.9 C) 98 F (36.7 C)  TempSrc:  Oral Oral Oral  SpO2: 96% 97% 97% 99%  Weight:      Height:        Intake/Output from previous day:   Intake/Output Summary (Last 24 hours) at 07/01/2021 0549 Last data filed at 06/30/2021 1848 Gross per 24 hour  Intake 5290 ml  Output --  Net 5290 ml    Physical Exam: General: Alert, awake, oriented x3, in no acute distress.  HEENT: [place]/AT PEERL, EOMI Neck: Trachea midline,  no masses, no thyromegal,y no JVD, no carotid bruit OROPHARYNX:  Moist, No exudate/ erythema/lesions.  Heart: Regular rate and rhythm, without murmurs, rubs, gallops, PMI non-displaced, no heaves or thrills on palpation.  Lungs: Clear to auscultation, no wheezing or rhonchi noted. No increased vocal fremitus resonant to percussion  Abdomen: Soft, nontender, nondistended, positive bowel sounds, no masses no hepatosplenomegaly noted..  Neuro: No focal neurological deficits noted cranial nerves II through XII grossly intact. DTRs 2+ bilaterally upper and lower extremities. Strength 5 out of 5 in bilateral upper and lower extremities. Musculoskeletal: No warm swelling or erythema around joints, no spinal tenderness noted. Psychiatric: Patient alert and oriented x3, good insight and cognition, good recent to remote  recall. Lymph node survey: No cervical axillary or inguinal lymphadenopathy noted.  Lab Results:  Basic Metabolic Panel:    Component Value Date/Time   NA 144 06/29/2021 1616   NA 142 07/02/2020 1040   K 3.6 06/29/2021 1616   CL 113 (H) 06/29/2021 1616   CO2 24 06/29/2021 1616   BUN 12 06/29/2021 1616   BUN 8 07/02/2020 1040   CREATININE 0.68 06/29/2021 1616   GLUCOSE 97 06/29/2021 1616   CALCIUM 9.8 06/29/2021 1616   CBC:    Component Value Date/Time   WBC 8.8 06/29/2021 1616   HGB 9.9 (L) 06/29/2021 1616   HGB 9.8 (L) 07/02/2020 1040   HCT 28.2 (L) 06/29/2021 1616   HCT 28.9 (L) 07/02/2020 1040   PLT 608 (H) 06/29/2021 1616   PLT 628 (H) 07/02/2020 1040   MCV 85.7 06/29/2021 1616   MCV 88 07/02/2020 1040   NEUTROABS 4.8 06/29/2021 1616   NEUTROABS 3.0 07/02/2020 1040   LYMPHSABS 2.8 06/29/2021 1616   LYMPHSABS 3.5 (H) 07/02/2020 1040   MONOABS 1.2 (H) 06/29/2021 1616   EOSABS 0.0 06/29/2021 1616   EOSABS 0.2 07/02/2020 1040   BASOSABS 0.0 06/29/2021 1616   BASOSABS 0.1 07/02/2020 1040    Recent Results (from the past 240 hour(s))  Resp Panel by RT-PCR (Flu A&B, Covid) Nasopharyngeal Swab     Status: None   Collection Time: 06/29/21  4:31 PM   Specimen: Nasopharyngeal Swab; Nasopharyngeal(NP) swabs in vial transport medium  Result Value Ref Range Status   SARS Coronavirus 2 by RT PCR  NEGATIVE NEGATIVE Final    Comment: (NOTE) SARS-CoV-2 target nucleic acids are NOT DETECTED.  The SARS-CoV-2 RNA is generally detectable in upper respiratory specimens during the acute phase of infection. The lowest concentration of SARS-CoV-2 viral copies this assay can detect is 138 copies/mL. A negative result does not preclude SARS-Cov-2 infection and should not be used as the sole basis for treatment or other patient management decisions. A negative result may occur with  improper specimen collection/handling, submission of specimen other than nasopharyngeal swab, presence  of viral mutation(s) within the areas targeted by this assay, and inadequate number of viral copies(<138 copies/mL). A negative result must be combined with clinical observations, patient history, and epidemiological information. The expected result is Negative.  Fact Sheet for Patients:  EntrepreneurPulse.com.au  Fact Sheet for Healthcare Providers:  IncredibleEmployment.be  This test is no t yet approved or cleared by the [place] FDA and  has been authorized for detection and/or diagnosis of SARS-CoV-2 by FDA under an Emergency Use Authorization (EUA). This EUA will remain  in effect (meaning this test can be used) for the duration of the COVID-19 declaration under Section 564(b)(1) of the Act, 21 U.S.C.section 360bbb-3(b)(1), unless the authorization is terminated  or revoked sooner.       Influenza A by PCR NEGATIVE NEGATIVE Final   Influenza B by PCR NEGATIVE NEGATIVE Final    Comment: (NOTE) The Xpert Xpress SARS-CoV-2/FLU/RSV plus assay is intended as an aid in the diagnosis of influenza from Nasopharyngeal swab specimens and should not be used as a sole basis for treatment. Nasal washings and aspirates are unacceptable for Xpert Xpress SARS-CoV-2/FLU/RSV testing.  Fact Sheet for Patients: EntrepreneurPulse.com.au  Fact Sheet for Healthcare Providers: IncredibleEmployment.be  This test is not yet approved or cleared by the [place] FDA and has been authorized for detection and/or diagnosis of SARS-CoV-2 by FDA under an Emergency Use Authorization (EUA). This EUA will remain in effect (meaning this test can be used) for the duration of the COVID-19 declaration under Section 564(b)(1) of the Act, 21 U.S.C. section 360bbb-3(b)(1), unless the authorization is terminated or revoked.  Performed at [facility], [place] [address], [place], [place] [address]      Studies/Results: No results found.  Medications: Scheduled Meds:  citalopram  20 mg Oral Daily   cyclobenzaprine  10 mg Oral BID   enoxaparin (LOVENOX) injection  40 mg Subcutaneous [id number]   folic acid  1 mg Oral Daily   HYDROmorphone   Intravenous Q4H   ketorolac  15 mg Intravenous Q6H   morphine  60 mg Oral Q12H   prenatal vitamin w/FE, FA  1 tablet Oral Q1200   senna-docusate  1 tablet Oral BID   Continuous Infusions:  sodium chloride 10 mL/hr at 06/30/21 1328   diphenhydrAMINE     PRN Meds:.diphenhydrAMINE **OR** diphenhydrAMINE, naloxone **AND** sodium chloride flush, ondansetron (ZOFRAN) IV, oxyCODONE, polyethylene glycol  Consultants: None  Procedures: None  Antibiotics: None  Assessment/Plan: Principal Problem:   Sickle cell crisis (HCC) Active Problems:   Anemia of chronic disease   Sickle cell pain crisis (HCC)   Depression  Sickle cell disease with pain crisis: Continue IV Dilaudid PCA, no changes in settings Continue IV fluids at [facility] Toradol 15 mg IV every 6 hours MS Contin 60 mg every 12 hours Restart oxycodone 10 mg every 4 hours as needed for severe breakthrough pain Monitor vital signs very closely, reevaluate pain scale regularly, and supplemental oxygen as needed  Chronic pain  syndrome: Continue home medications  History of depression: Continue home medications.  Patient denies any suicidal or homicidal intent.  Continue to monitor closely.  Anemia of chronic disease: Hemoglobin is stable and consistent with patient's baseline.  There is no clinical indication for blood transfusion at this time.  Follow closely.  Code Status: Full Code Family Communication: N/A Disposition Plan: Not yet ready for discharge  [place], MSN, FNP-C [facility] [address] [place], [place] [address] [phone]  If 5PM-8AM, please contact night-coverage.  07/01/2021, 5:49 AM  LOS: 1 day

## 2021-07-01 DIAGNOSIS — D5700 Hb-SS disease with crisis, unspecified: Secondary | ICD-10-CM | POA: Diagnosis not present

## 2021-07-01 NOTE — Discharge Summary (Signed)
Physician Discharge Summary  Carmen [name] [id number] DOB: 01-Jul-1991 DOA: 06/29/2021  PCP: Vevelyn Francois, NP  Admit date: 06/29/2021  Discharge date: 07/01/2021  Discharge Diagnoses:  Principal Problem:   Sickle cell crisis ([name]   Anemia of chronic disease   Sickle cell pain crisis [name]   Depression   Discharge Condition: Stable  Disposition:  Pt is discharged home in good condition and is to follow up with Vevelyn Francois, NP this week to have labs evaluated. Carmen [name] is instructed to increase activity slowly and balance with rest for the next few days, and use prescribed medication to complete treatment of pain  Diet: Regular Wt Readings from Last 3 Encounters:  06/29/21 83.9 kg  06/19/21 85.3 kg  05/22/21 79 kg    History of present illness:  Carmen [name] is a 30 year old female with a medical history significant for sickle cell disease, chronic pain syndrome, opiate dependence and tolerance, depression, and history of anemia of chronic disease presented with concerns of pain to her legs for the past 2 weeks. She was recently hospitalized from 8/19-8/25.  She states when she got out she did not have her prescription for MS Contin 60 mg every 12 hours.  She has been trying to bear with the pain while taking her oxycodone 10 mg every 6 hours without success.  She tried to present to the sickle cell clinic today, but was declined admission.  States that she is hurting all throughout her back and legs, which is typical of her usual sickle cell pain crisis.  She denies any headache, chest pain.  Reports current tobacco use, denies alcohol or illicit drug use.  ED course: She was afebrile, normotensive, on room air.  No leukocytosis.  Hemoglobin stable at baseline of 9.9 g/dL.  Platelets elevated at 608,000.  BMP otherwise unremarkable.  She was given 2.5 mg total of IV Dilaudid, but continued to have pain.  Hospitalist consulted for admission.     Hospital Course:  Sickle cell disease with pain crisis: Patient was admitted for sickle cell pain crisis and managed appropriately with IVF, IV Dilaudid via PCA and IV Toradol, as well as other adjunct therapies per sickle cell pain management protocols.  Pain intensity improved to 5/10 and patient is discharged home.  She feels that she can manage at home on current medication regimen.  Patient says that her MS Contin is around.  Advised to resume home medications.  Patient was therefore discharged home today in a hemodynamically stable condition.   Carmen [name] will follow-up with PCP within 1 week of this discharge. Carmen [name] was counseled extensively about nonpharmacologic means of pain management, patient verbalized understanding and was appreciative of  the care received during this admission.   We discussed the need for good hydration, monitoring of hydration status, avoidance of heat, cold, stress, and infection triggers. We discussed the need to be adherent with taking home medications. Patient was reminded of the need to seek medical attention immediately if any symptom of bleeding, anemia, or infection occurs.  Discharge Exam: Vitals:   07/01/21 0939 07/01/21 1207  BP: (!) 109/55   Pulse: 74   Resp: 15 16  Temp: 98.3 F (36.8 C)   SpO2: 98% 98%   Vitals:   07/01/21 0509 07/01/21 0739 07/01/21 0939 07/01/21 1207  BP: 125/62  (!) 109/55   Pulse: 70  74   Resp: '18 18 15 16  '$ Temp: 98 F (36.7 C)  98.3 F (36.8  C)   TempSrc: Oral  Oral   SpO2: 99% 99% 98% 98%  Weight:      Height:        General appearance : Awake, alert, not in any distress. Speech Clear. Not toxic looking HEENT: Atraumatic and Normocephalic, pupils equally reactive to light and accomodation Neck: Supple, no JVD. No cervical lymphadenopathy.  Chest: Good air entry bilaterally, no added sounds  CVS: S1 S2 regular, no murmurs.  Abdomen: Bowel sounds present, Non tender and not distended with no gaurding, rigidity  or rebound. Extremities: B/L Lower Ext shows no edema, both legs are warm to touch Neurology: Awake alert, and oriented X 3, CN II-XII intact, Non focal Skin: No Rash  Discharge Instructions  Discharge Instructions     Discharge patient   Complete by: As directed    Discharge disposition: 01-Home or Self Care   Discharge patient date: 07/01/2021      Allergies as of 07/01/2021       Reactions   Ketamine Hives, Other (See Comments)   Makes feel funny        Medication List     TAKE these medications    augmented betamethasone dipropionate 0.05 % cream Commonly known as: DIPROLENE-AF Apply topically 2 (two) times daily.   citalopram 20 MG tablet Commonly known as: CELEXA TAKE 1 TABLET BY MOUTH EVERY DAY   cyclobenzaprine 10 MG tablet Commonly known as: FLEXERIL TAKE 1 TABLET BY MOUTH TWICE A DAY What changed: when to take this   EXCEDRIN PO Take 1-2 tablets by mouth every 6 (six) hours as needed (pain).   folic acid 1 MG tablet Commonly known as: FOLVITE Take 1 tablet (1 mg total) by mouth daily.   morphine 60 MG 12 hr tablet Commonly known as: MS Contin Take 1 tablet (60 mg total) by mouth every 12 (twelve) hours.   Oxycodone HCl 10 MG Tabs Take 1 tablet (10 mg total) by mouth every 6 (six) hours as needed for up to 15 days (pain).   Prenatal Vitamin Plus Low Iron 27-1 MG Tabs TAKE 1 TABLET BY MOUTH EVERY DAY   Vitamin D (Ergocalciferol) 1.25 MG (50000 UNIT) Caps capsule Commonly known as: DRISDOL TAKE ONE CAPSULE BY MOUTH ONE TIME PER WEEK What changed:  how much to take how to take this when to take this additional instructions        The results of significant diagnostics from this hospitalization (including imaging, microbiology, ancillary and laboratory) are listed below for reference.    Significant Diagnostic Studies: DG Chest Port 1 View  Result Date: 06/19/2021 CLINICAL DATA:  Sickle cell pain in the back. EXAM: PORTABLE CHEST 1 VIEW  COMPARISON:  05/21/2021 FINDINGS: The heart size and mediastinal contours are within normal limits. Both lungs are clear. The visualized skeletal structures are unremarkable. IMPRESSION: Negative chest. Electronically Signed   By: Monte Fantasia M.D.   On: 06/19/2021 09:54    Microbiology: Recent Results (from the past 240 hour(s))  Resp Panel by RT-PCR (Flu A&B, Covid) Nasopharyngeal Swab     Status: None   Collection Time: 06/29/21  4:31 PM   Specimen: Nasopharyngeal Swab; Nasopharyngeal(NP) swabs in vial transport medium  Result Value Ref Range Status   SARS Coronavirus 2 by RT PCR NEGATIVE NEGATIVE Final    Comment: (NOTE) SARS-CoV-2 target nucleic acids are NOT DETECTED.  The SARS-CoV-2 RNA is generally detectable in upper respiratory specimens during the acute phase of infection. The lowest concentration of SARS-CoV-2 viral  copies this assay can detect is 138 copies/mL. A negative result does not preclude SARS-Cov-2 infection and should not be used as the sole basis for treatment or other patient management decisions. A negative result may occur with  improper specimen collection/handling, submission of specimen other than nasopharyngeal swab, presence of viral mutation(s) within the areas targeted by this assay, and inadequate number of viral copies(<138 copies/mL). A negative result must be combined with clinical observations, patient history, and epidemiological information. The expected result is Negative.  Fact Sheet for Patients:  EntrepreneurPulse.com.au  Fact Sheet for Healthcare Providers:  IncredibleEmployment.be  This test is no t yet approved or cleared by the [place] FDA and  has been authorized for detection and/or diagnosis of SARS-CoV-2 by FDA under an Emergency Use Authorization (EUA). This EUA will remain  in effect (meaning this test can be used) for the duration of the COVID-19 declaration under Section 564(b)(1)  of the Act, 21 U.S.C.section 360bbb-3(b)(1), unless the authorization is terminated  or revoked sooner.       Influenza A by PCR NEGATIVE NEGATIVE Final   Influenza B by PCR NEGATIVE NEGATIVE Final    Comment: (NOTE) The Xpert Xpress SARS-CoV-2/FLU/RSV plus assay is intended as an aid in the diagnosis of influenza from Nasopharyngeal swab specimens and should not be used as a sole basis for treatment. Nasal washings and aspirates are unacceptable for Xpert Xpress SARS-CoV-2/FLU/RSV testing.  Fact Sheet for Patients: EntrepreneurPulse.com.au  Fact Sheet for Healthcare Providers: IncredibleEmployment.be  This test is not yet approved or cleared by the [place] FDA and has been authorized for detection and/or diagnosis of SARS-CoV-2 by FDA under an Emergency Use Authorization (EUA). This EUA will remain in effect (meaning this test can be used) for the duration of the COVID-19 declaration under Section 564(b)(1) of the Act, 21 U.S.C. section 360bbb-3(b)(1), unless the authorization is terminated or revoked.  Performed at [facility], [place] [address], [place], [place] [address]      Labs: Basic Metabolic Panel: Recent Labs  Lab 06/29/21 1616  NA 144  K 3.6  CL 113*  CO2 24  GLUCOSE 97  BUN 12  CREATININE 0.68  CALCIUM 9.8   Liver Function Tests: Recent Labs  Lab 06/29/21 1616  AST 20  ALT 20  ALKPHOS 50  BILITOT 1.1  PROT 9.0*  ALBUMIN 4.8   No results for input(s): LIPASE, AMYLASE in the last 168 hours. No results for input(s): AMMONIA in the last 168 hours. CBC: Recent Labs  Lab 06/29/21 1616  WBC 8.8  NEUTROABS 4.8  HGB 9.9*  HCT 28.2*  MCV 85.7  PLT 608*   Cardiac Enzymes: No results for input(s): CKTOTAL, CKMB, CKMBINDEX, TROPONINI in the last 168 hours. BNP: Invalid input(s): POCBNP CBG: No results for input(s): GLUCAP in the last 168 hours.  Time coordinating discharge: 35  minutes  Signed: [name]  APRN, MSN, FNP-C [facility] [address] [place], [place] [address] [phone]  [facility] 07/01/2021, 5:08 PM

## 2021-07-01 NOTE — Telephone Encounter (Signed)
Confirmation [id number] WBenefit Plan:MCAIDHealth Plan:[id number] Prior Approval [id number] PA Type:PHARMACYRecipient:Jerrilyn R SIMMONSRecipient [id number] OBilling Provider:Billing Provider [id number] Provider Name:CRYSTAL Suan Halter Provider [phone] Submission Date:08/30/2022Status:APPROVEDEffective Begin Date:08/30/2022Effective End Date:06/30/2022

## 2021-07-07 ENCOUNTER — Other Ambulatory Visit: Payer: Self-pay

## 2021-07-08 ENCOUNTER — Other Ambulatory Visit: Payer: Self-pay | Admitting: Nurse Practitioner

## 2021-07-08 MED ORDER — OXYCODONE HCL 10 MG PO TABS: 10.0000 mg | ORAL_TABLET | Freq: Four times a day (QID) | ORAL | 0 refills | Status: DC | PRN

## 2021-07-08 MED ORDER — BETAMETHASONE VALERATE 0.1 % EX OINT: 1.0000 "application " | TOPICAL_OINTMENT | Freq: Two times a day (BID) | CUTANEOUS | 0 refills | Status: DC

## 2021-07-08 NOTE — Progress Notes (Signed)
   [facility] [address] [place], [place]  [address] Phone:  [phone]   Fax:  [phone] 

## 2021-07-22 ENCOUNTER — Other Ambulatory Visit: Payer: Self-pay

## 2021-07-23 MED ORDER — OXYCODONE HCL 10 MG PO TABS: 10.0000 mg | ORAL_TABLET | Freq: Four times a day (QID) | ORAL | 0 refills | Status: DC | PRN

## 2021-07-24 ENCOUNTER — Encounter (HOSPITAL_COMMUNITY): Payer: Self-pay

## 2021-07-24 ENCOUNTER — Inpatient Hospital Stay (HOSPITAL_COMMUNITY)
Admission: EM | Admit: 2021-07-24 | Discharge: 2021-07-26 | DRG: 812 | Disposition: A | Discharge status: Home / Self Care | Payer: Medicaid Other | Attending: Internal Medicine | Admitting: Internal Medicine

## 2021-07-24 ENCOUNTER — Other Ambulatory Visit: Payer: Self-pay

## 2021-07-24 DIAGNOSIS — Z79899 Other long term (current) drug therapy: Secondary | ICD-10-CM | POA: Diagnosis not present

## 2021-07-24 DIAGNOSIS — D6959 Other secondary thrombocytopenia: Secondary | ICD-10-CM | POA: Diagnosis present

## 2021-07-24 DIAGNOSIS — Z888 Allergy status to other drugs, medicaments and biological substances status: Secondary | ICD-10-CM | POA: Diagnosis not present

## 2021-07-24 DIAGNOSIS — M7918 Myalgia, other site: Secondary | ICD-10-CM | POA: Diagnosis present

## 2021-07-24 DIAGNOSIS — Z832 Family history of diseases of the blood and blood-forming organs and certain disorders involving the immune mechanism: Secondary | ICD-10-CM

## 2021-07-24 DIAGNOSIS — F17210 Nicotine dependence, cigarettes, uncomplicated: Secondary | ICD-10-CM | POA: Diagnosis present

## 2021-07-24 DIAGNOSIS — F329 Major depressive disorder, single episode, unspecified: Secondary | ICD-10-CM | POA: Diagnosis present

## 2021-07-24 DIAGNOSIS — G8929 Other chronic pain: Secondary | ICD-10-CM | POA: Diagnosis present

## 2021-07-24 DIAGNOSIS — G894 Chronic pain syndrome: Secondary | ICD-10-CM | POA: Diagnosis present

## 2021-07-24 DIAGNOSIS — F419 Anxiety disorder, unspecified: Secondary | ICD-10-CM | POA: Diagnosis present

## 2021-07-24 DIAGNOSIS — D638 Anemia in other chronic diseases classified elsewhere: Secondary | ICD-10-CM | POA: Diagnosis present

## 2021-07-24 DIAGNOSIS — Z9049 Acquired absence of other specified parts of digestive tract: Secondary | ICD-10-CM | POA: Diagnosis not present

## 2021-07-24 DIAGNOSIS — D5700 Hb-SS disease with crisis, unspecified: Principal | ICD-10-CM | POA: Diagnosis present

## 2021-07-24 DIAGNOSIS — F32A Depression, unspecified: Secondary | ICD-10-CM | POA: Diagnosis present

## 2021-07-24 DIAGNOSIS — Z9081 Acquired absence of spleen: Secondary | ICD-10-CM | POA: Diagnosis not present

## 2021-07-24 LAB — CBC WITH DIFFERENTIAL/PLATELET
Abs Immature Granulocytes: 0.07 10*3/uL (ref 0.00–0.07)
Basophils Absolute: 0.1 10*3/uL (ref 0.0–0.1)
Basophils Relative: 1 %
Eosinophils Absolute: 0.1 10*3/uL (ref 0.0–0.5)
Eosinophils Relative: 2 %
HCT: 27.4 % — ABNORMAL LOW (ref 36.0–46.0)
Hemoglobin: 9.7 g/dL — ABNORMAL LOW (ref 12.0–15.0)
Immature Granulocytes: 1 %
Lymphocytes Relative: 33 %
Lymphs Abs: 2.9 10*3/uL (ref 0.7–4.0)
MCH: 29.8 pg (ref 26.0–34.0)
MCHC: 35.4 g/dL (ref 30.0–36.0)
MCV: 84.3 fL (ref 80.0–100.0)
Monocytes Absolute: 1.1 10*3/uL — ABNORMAL HIGH (ref 0.1–1.0)
Monocytes Relative: 12 %
Neutro Abs: 4.7 10*3/uL (ref 1.7–7.7)
Neutrophils Relative %: 51 %
Platelets: 455 10*3/uL — ABNORMAL HIGH (ref 150–400)
RBC: 3.25 MIL/uL — ABNORMAL LOW (ref 3.87–5.11)
RDW: 15.2 % (ref 11.5–15.5)
WBC: 9 10*3/uL (ref 4.0–10.5)
nRBC: 3.2 % — ABNORMAL HIGH (ref 0.0–0.2)

## 2021-07-24 LAB — I-STAT BETA HCG BLOOD, ED (MC, WL, AP ONLY): I-stat hCG, quantitative: <5 m[IU]/mL (ref <5)

## 2021-07-24 LAB — COMPREHENSIVE METABOLIC PANEL
ALT: 16 U/L (ref 0–44)
AST: 22 U/L (ref 15–41)
Albumin: 4.2 g/dL (ref 3.5–5.0)
Alkaline Phosphatase: 54 U/L (ref 38–126)
Anion gap: 7 (ref 5–15)
BUN: 11 mg/dL (ref 6–20)
CO2: 23 mmol/L (ref 22–32)
Calcium: 9.7 mg/dL (ref 8.9–10.3)
Chloride: 112 mmol/L — ABNORMAL HIGH (ref 98–111)
Creatinine, Ser: 0.56 mg/dL (ref 0.44–1.00)
GFR, Estimated: >60 mL/min (ref >60)
Glucose, Bld: 104 mg/dL — ABNORMAL HIGH (ref 70–99)
Potassium: 4.3 mmol/L (ref 3.5–5.1)
Sodium: 142 mmol/L (ref 135–145)
Total Bilirubin: 0.8 mg/dL (ref 0.3–1.2)
Total Protein: 7.7 g/dL (ref 6.5–8.1)

## 2021-07-24 LAB — RETICULOCYTES
Immature Retic Fract: 39.8 % — ABNORMAL HIGH (ref 2.3–15.9)
RBC.: 3.23 MIL/uL — ABNORMAL LOW (ref 3.87–5.11)
Retic Count, Absolute: 248.4 10*3/uL — ABNORMAL HIGH (ref 19.0–186.0)
Retic Ct Pct: 7.7 % — ABNORMAL HIGH (ref 0.4–3.1)

## 2021-07-24 MED ORDER — ONDANSETRON HCL 4 MG/2ML IJ SOLN: 4.0000 mg | Freq: Four times a day (QID) | INTRAMUSCULAR | Status: DC | PRN

## 2021-07-24 MED ORDER — OXYCODONE HCL 5 MG PO TABS: 10.0000 mg | ORAL_TABLET | Freq: Four times a day (QID) | ORAL | Status: DC | PRN

## 2021-07-24 MED ORDER — CYCLOBENZAPRINE HCL 10 MG PO TABS
10.0000 mg | ORAL_TABLET | Freq: Two times a day (BID) | ORAL | Status: DC | PRN
  Administered 2021-07-24: 10 mg via ORAL
  Filled 2021-07-24: qty 1

## 2021-07-24 MED ORDER — ONDANSETRON HCL 4 MG/2ML IJ SOLN
4.0000 mg | INTRAMUSCULAR | Status: DC | PRN
  Administered 2021-07-24 (×2): 4 mg via INTRAVENOUS
  Filled 2021-07-24 (×2): qty 2

## 2021-07-24 MED ORDER — DIPHENHYDRAMINE HCL 25 MG PO CAPS: 25.0000 mg | ORAL_CAPSULE | ORAL | Status: DC | PRN

## 2021-07-24 MED ORDER — DIPHENHYDRAMINE HCL 25 MG PO CAPS
25.0000 mg | ORAL_CAPSULE | ORAL | Status: DC | PRN
  Administered 2021-07-24: 25 mg via ORAL
  Filled 2021-07-24: qty 1

## 2021-07-24 MED ORDER — ENOXAPARIN SODIUM 40 MG/0.4ML IJ SOSY
40.0000 mg | PREFILLED_SYRINGE | INTRAMUSCULAR | Status: DC
  Administered 2021-07-24: 40 mg via SUBCUTANEOUS
  Filled 2021-07-24: qty 0.4

## 2021-07-24 MED ORDER — HYDROMORPHONE 1 MG/ML IV SOLN
INTRAVENOUS | Status: DC
Start: 2021-07-24 — End: 2021-07-26
  Administered 2021-07-24: 9 mg via INTRAVENOUS
  Administered 2021-07-24 – 2021-07-25 (×2): 3.5 mg via INTRAVENOUS
  Administered 2021-07-25: 2 mg via INTRAVENOUS
  Administered 2021-07-25: 7.5 mg via INTRAVENOUS
  Administered 2021-07-25: 4 mg via INTRAVENOUS
  Administered 2021-07-25: 2 mg via INTRAVENOUS
  Administered 2021-07-25: 1.5 mg via INTRAVENOUS
  Administered 2021-07-26: 9.5 mg via INTRAVENOUS
  Administered 2021-07-26: 2.5 mg via INTRAVENOUS
  Filled 2021-07-24 (×2): qty 30

## 2021-07-24 MED ORDER — SODIUM CHLORIDE 0.45 % IV SOLN: INTRAVENOUS | Status: DC

## 2021-07-24 MED ORDER — NALOXONE HCL 0.4 MG/ML IJ SOLN
0.4000 mg | INTRAMUSCULAR | Status: DC | PRN
Start: 2021-07-24 — End: 2021-07-26

## 2021-07-24 MED ORDER — HYDROMORPHONE HCL 2 MG/ML IJ SOLN
2.0000 mg | INTRAMUSCULAR | Status: AC
  Administered 2021-07-24: 2 mg via INTRAVENOUS
  Filled 2021-07-24: qty 1

## 2021-07-24 MED ORDER — POLYETHYLENE GLYCOL 3350 17 G PO PACK: 17.0000 g | PACK | Freq: Every day | ORAL | Status: DC | PRN

## 2021-07-24 MED ORDER — CITALOPRAM HYDROBROMIDE 20 MG PO TABS
20.0000 mg | ORAL_TABLET | Freq: Every day | ORAL | Status: DC
  Administered 2021-07-25 – 2021-07-26 (×2): 20 mg via ORAL
  Filled 2021-07-24 (×3): qty 1

## 2021-07-24 MED ORDER — PRENATAL MULTIVITAMIN CH
1.0000 | ORAL_TABLET | Freq: Every day | ORAL | Status: DC
  Administered 2021-07-25: 1 via ORAL
  Filled 2021-07-24 (×2): qty 1

## 2021-07-24 MED ORDER — FOLIC ACID 1 MG PO TABS
1.0000 mg | ORAL_TABLET | Freq: Every day | ORAL | Status: DC
  Administered 2021-07-25 – 2021-07-26 (×2): 1 mg via ORAL
  Filled 2021-07-24 (×3): qty 1

## 2021-07-24 MED ORDER — KETOROLAC TROMETHAMINE 15 MG/ML IJ SOLN
15.0000 mg | Freq: Four times a day (QID) | INTRAMUSCULAR | Status: DC
  Administered 2021-07-24 – 2021-07-26 (×8): 15 mg via INTRAVENOUS
  Filled 2021-07-24 (×8): qty 1

## 2021-07-24 MED ORDER — HYDROMORPHONE HCL 2 MG/ML IJ SOLN
2.0000 mg | INTRAMUSCULAR | Status: AC
  Filled 2021-07-24: qty 1

## 2021-07-24 MED ORDER — MORPHINE SULFATE ER 15 MG PO TBCR
60.0000 mg | EXTENDED_RELEASE_TABLET | Freq: Two times a day (BID) | ORAL | Status: DC
  Administered 2021-07-24 – 2021-07-26 (×4): 60 mg via ORAL
  Filled 2021-07-24 (×5): qty 4

## 2021-07-24 MED ORDER — SODIUM CHLORIDE 0.9% FLUSH: 9.0000 mL | INTRAVENOUS | Status: DC | PRN

## 2021-07-24 MED ORDER — SODIUM CHLORIDE 0.45 % IV SOLN
INTRAVENOUS | Status: DC
Start: 2021-07-24 — End: 2021-07-26

## 2021-07-24 MED ORDER — KETOROLAC TROMETHAMINE 15 MG/ML IJ SOLN
15.0000 mg | INTRAMUSCULAR | Status: AC
  Administered 2021-07-24: 15 mg via INTRAVENOUS
  Filled 2021-07-24: qty 1

## 2021-07-24 MED ORDER — SENNOSIDES-DOCUSATE SODIUM 8.6-50 MG PO TABS
1.0000 | ORAL_TABLET | Freq: Two times a day (BID) | ORAL | Status: DC
Start: 2021-07-24 — End: 2021-07-26
  Administered 2021-07-24 – 2021-07-26 (×5): 1 via ORAL
  Filled 2021-07-24 (×5): qty 1

## 2021-07-24 MED ORDER — HYDROMORPHONE HCL 2 MG/ML IJ SOLN
2.0000 mg | INTRAMUSCULAR | Status: DC | PRN
  Administered 2021-07-24: 2 mg via INTRAVENOUS

## 2021-07-24 NOTE — ED Provider Notes (Signed)
[facility] DEPT Provider Note   CSN: [id number] Arrival date & time: 07/24/21  0827     History Chief Complaint  Patient presents with   Sickle Cell Pain Crisis    Carmen [name] is a 30 y.o. female.  The history is provided by the patient.  Sickle Cell Pain Crisis Pain location: Joints back extremities. Severity:  Severe Onset quality:  Gradual Duration:  1 day Similar to previous crisis episodes: yes   Timing:  Constant Progression:  Unchanged Chronicity:  Recurrent Context: cold exposure   Context: not change in medication, not dehydration, not infection and not non-compliance   Relieved by:  Nothing Ineffective treatments:  Prescription drugs Associated symptoms: no chest pain, no fatigue, no nausea and no shortness of breath   Patient presents with a typical sickle cell crisis.  She states her symptoms seem to get worse with this cold weather change.  She tried contacting the sickle cell clinic today but they do not have any staffing.    Past Medical History:  Diagnosis Date   Anxiety    Headache(784.0)    Heart murmur    Sickle cell crisis ([place])    Syphilis 2015   Was diagnosed and received one injection of antibiotics   Thrombocytosis 11/22/2014     CBC Latest Ref Rng & Units 12/13/2018 12/11/2018 12/10/2018 WBC 4.0 - 10.5 K/uL 14.8(H) 13.2(H) 15.9(H) Hemoglobin 12.0 - 15.0 g/dL 8.2(L) 7.7(L) 8.1(L) Hematocrit 36.0 - 46.0 % 23.7(L) 23.2(L) 24.5(L) Platelets 150 - 400 K/uL 326 399 388      Patient Active Problem List   Diagnosis Date Noted   Moderate episode of recurrent major depressive disorder ([place]) 05/28/2021   Depression 05/22/2021   Tobacco abuse 05/21/2021   Sickle cell disease with crisis ([place]) 03/23/2021   Acute pain of right shoulder    Sickle cell anemia with crisis ([place]) 02/16/2021   Acute chest syndrome ([place]) 01/18/2021   Community acquired pneumonia of right lower lobe of lung    Fever of unknown origin 10/14/2020    Sickle cell crisis ([place]) 03/31/2020   Bacterial vaginitis 01/04/2020   Acute cystitis without hematuria    Dysuria 12/31/2019   Urine leukocytes    Sickle cell pain crisis ([place]) 09/10/2019   Sickle cell disease ([place]) 12/04/2018   Alpha thalassemia silent carrier 12/04/2018   Chronic prescription opiate use 03/13/2018   Chronic musculoskeletal pain 03/13/2018   Vitamin D deficiency 01/13/2018   Leukocytosis 08/21/2017   Hypokalemia 06/27/2017   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[place]) 04/05/2017   Hb-SS disease without crisis ([place]) 08/15/2016   Anemia of chronic disease    Chest pain 21/30/8657   Systolic ejection murmur 84/69/6295   Sickle cell anemia of mother during pregnancy ([place]) 05/16/2014    Past Surgical History:  Procedure Laterality Date   CESAREAN SECTION N/A 02/05/2019   Procedure: CESAREAN SECTION;  Surgeon: [name], MD;  Location: [facility] LD ORS;  Service: Obstetrics;  Laterality: N/A;   CHOLECYSTECTOMY N/A 11/30/2014   Procedure: LAPAROSCOPIC CHOLECYSTECTOMY SINGLE SITE WITH INTRAOPERATIVE CHOLANGIOGRAM;  Surgeon: Michael Boston, MD;  Location: [facility] ORS;  Service: General;  Laterality: N/A;   SPLENECTOMY       OB History     Gravida  2   Para  1   Term  1   Preterm      AB  1   Living  1      SAB  IAB  1   Ectopic      Multiple  0   Live Births  1           Family History  Problem Relation Age of Onset   Hypertension Mother    Sickle cell anemia Sister    Kidney disease Sister        Lupus   Arthritis Sister    Sickle cell anemia Sister    Sickle cell trait Sister    Heart disease Maternal Aunt        CABG   Heart disease Maternal Uncle        CABG   Lupus Sister     Social History   Tobacco Use   Smoking status: Some Days    Types: Cigarettes   Smokeless tobacco: Never  Vaping Use   Vaping Use: Never used  Substance Use Topics   Alcohol use: No   Drug use: No    Home Medications Prior to Admission  medications   Medication Sig Start Date End Date Taking? Authorizing Provider  Aspirin-Acetaminophen-Caffeine (EXCEDRIN PO) Take 1-2 tablets by mouth every 6 (six) hours as needed (pain).    [provider]  augmented betamethasone dipropionate (DIPROLENE-AF) 0.05 % cream Apply topically 2 (two) times daily. Patient not taking: No sig reported 06/04/21   Vevelyn Francois, NP  betamethasone valerate ointment (VALISONE) 0.1 % Apply 1 application topically 2 (two) times daily. 07/08/21   Vevelyn Francois, NP  citalopram (CELEXA) 20 MG tablet TAKE 1 TABLET BY MOUTH EVERY DAY Patient taking differently: Take 20 mg by mouth daily. 02/24/21   [name], FNP  cyclobenzaprine (FLEXERIL) 10 MG tablet TAKE 1 TABLET BY MOUTH TWICE A DAY Patient taking differently: Take 10 mg by mouth in the morning and at bedtime. 03/25/21   Vevelyn Francois, NP  folic acid (FOLVITE) 1 MG tablet Take 1 tablet (1 mg total) by mouth daily. 03/15/19   Lanae Boast, FNP  morphine (MS CONTIN) 60 MG 12 hr tablet Take 1 tablet (60 mg total) by mouth every 12 (twelve) hours. 06/29/21 07/29/21  Vevelyn Francois, NP  Oxycodone HCl 10 MG TABS Take 1 tablet (10 mg total) by mouth every 6 (six) hours as needed for up to 15 days (pain). 07/25/21 08/09/21  Vevelyn Francois, NP  Prenatal Vit-Fe Fumarate-FA (PRENATAL VITAMIN PLUS LOW IRON) 27-1 MG TABS TAKE 1 TABLET BY MOUTH EVERY DAY Patient taking differently: Take 1 tablet by mouth daily. 03/03/20   Shelly Bombard, MD  Vitamin D, Ergocalciferol, (DRISDOL) 1.25 MG (50000 UNIT) CAPS capsule TAKE ONE CAPSULE BY MOUTH ONE TIME PER WEEK Patient taking differently: Take 50,000 Units by mouth every Wednesday. 09/03/20   Vevelyn Francois, NP    Allergies    Ketamine  Review of Systems   Review of Systems  Constitutional:  Negative for fatigue.  Respiratory:  Negative for shortness of breath.   Cardiovascular:  Negative for chest pain.  Gastrointestinal:  Negative for nausea.  All other  systems reviewed and are negative.  Physical Exam Updated Vital Signs BP 124/66   Pulse (!) 59   Temp 98.2 F (36.8 C) (Oral)   Resp 15   Ht 1.6 m (5\' 3" )   Wt 83.9 kg   LMP 06/25/2021 (Approximate)   SpO2 99%   BMI 32.77 kg/m   Physical Exam Vitals and nursing note reviewed.  Constitutional:      Appearance: She is well-developed. She  is not ill-appearing or diaphoretic.  HENT:     Head: Normocephalic and atraumatic.     Right Ear: External ear normal.     Left Ear: External ear normal.  Eyes:     General: No scleral icterus.       Right eye: No discharge.        Left eye: No discharge.     Conjunctiva/sclera: Conjunctivae normal.  Neck:     Trachea: No tracheal deviation.  Cardiovascular:     Rate and Rhythm: Normal rate and regular rhythm.  Pulmonary:     Effort: Pulmonary effort is normal. No respiratory distress.     Breath sounds: Normal breath sounds. No stridor. No wheezing or rales.  Abdominal:     General: Bowel sounds are normal. There is no distension.     Palpations: Abdomen is soft.     Tenderness: There is no abdominal tenderness. There is no guarding or rebound.  Musculoskeletal:        General: No tenderness or deformity.     Cervical back: Neck supple.  Skin:    General: Skin is warm and dry.     Findings: No rash.  Neurological:     General: No focal deficit present.     Mental Status: She is alert.     Cranial Nerves: No cranial nerve deficit (no facial droop, extraocular movements intact, no slurred speech).     Sensory: No sensory deficit.     Motor: No abnormal muscle tone or seizure activity.     Coordination: Coordination normal.  Psychiatric:        Mood and Affect: Mood normal.    ED Results / Procedures / Treatments   Labs (all labs ordered are listed, but only abnormal results are displayed) Labs Reviewed  COMPREHENSIVE METABOLIC PANEL - Abnormal; Notable for the following components:      Result Value   Chloride 112 (*)     Glucose, Bld 104 (*)    All other components within normal limits  CBC WITH DIFFERENTIAL/PLATELET - Abnormal; Notable for the following components:   RBC 3.25 (*)    Hemoglobin 9.7 (*)    HCT 27.4 (*)    Platelets 455 (*)    nRBC 3.2 (*)    Monocytes Absolute 1.1 (*)    All other components within normal limits  RETICULOCYTES - Abnormal; Notable for the following components:   Retic Ct Pct 7.7 (*)    RBC. 3.23 (*)    Retic Count, Absolute 248.4 (*)    Immature Retic Fract 39.8 (*)    All other components within normal limits  I-STAT BETA HCG BLOOD, ED ([facility], [facility], [facility] ONLY)    EKG None  Radiology No results found.  Procedures Procedures   Medications Ordered in ED Medications  0.45 % sodium chloride infusion ( Intravenous New Bag/Given 07/24/21 1006)  diphenhydrAMINE (BENADRYL) capsule 25-50 mg (25 mg Oral Given 07/24/21 1007)  ondansetron (ZOFRAN) injection 4 mg (4 mg Intravenous Given 07/24/21 1009)  HYDROmorphone (DILAUDID) injection 2 mg (has no administration in time range)  ketorolac (TORADOL) 15 MG/ML injection 15 mg (15 mg Intravenous Given 07/24/21 1008)  HYDROmorphone (DILAUDID) injection 2 mg (2 mg Intravenous Given 07/24/21 1008)  HYDROmorphone (DILAUDID) injection 2 mg (2 mg Intravenous Given 07/24/21 1119)    ED Course  I have reviewed the triage vital signs and the nursing notes.  Pertinent labs & imaging results that were available during my care of the patient were  reviewed by me and considered in my medical decision making (see chart for details).  Clinical Course as of 07/24/21 1340  Fri Jul 24, 2021  1101 CBC shows stable anemia.  Metabolic panel is unremarkable. [JK]  1101 Reticulocyte count elevated [JK]  1132 Patient received her first dose.  Still having pain.  Second dose just administered [JK]    Clinical Course User Index [JK] Dorie Rank, MD   MDM Rules/Calculators/A&P                           Patient presented with sickle cell pain crisis.   Pain is typical for her sickle cell pain crisis.  No signs of infection.  No findings to suggest acute chest syndrome.  No signs of aplastic crisis or acute metabolic abnormality.  Patient was treated with pain medications and antiemetics.  Unfortunately patient's pain persisted.  Discussed the case with NP Smith Robert, Sickle cell team who will evaluate pt for admission, further treatment. Final Clinical Impression(s) / ED Diagnoses Final diagnoses:  Sickle cell pain crisis [facility])    Rx / DC Orders ED Discharge Orders     None        Dorie Rank, MD 07/24/21 1340

## 2021-07-24 NOTE — ED Triage Notes (Signed)
Patient c/o sickle cell pain both shoulders and entire back to her hips x 2 days, worse today.

## 2021-07-24 NOTE — ED Notes (Signed)
Pt ambulatory without assistance.  

## 2021-07-24 NOTE — ED Notes (Signed)
Pt expressed that her pain has not changed.

## 2021-07-24 NOTE — H&P (Signed)
H&P  Patient Demographics:  Carmen [name], is a 30 y.o. female  MRN: [id number]   DOB - [birth year]  Admit Date - 07/24/2021  Outpatient Primary MD for the patient is Vevelyn Francois, NP  Chief Complaint  Patient presents with   Sickle Cell Pain Crisis      HPI:   Carmen [name]  is a 30 y.o. female with a medical history significant for sickle cell disease, chronic pain syndrome, opiate dependence and tolerance, anemia of chronic disease, and history of depression presents to the ER with complaints of low back and lower extremity pain that is consistent with her typical pain crisis.  Patient states that she awakened this a.m. with extreme pain.  She attributes current pain crisis to sleeping with an open window.  She states that pain intensity is 10/10 unrelieved by her home medications that include MS Contin and oxycodone.  There is no headache, chest pain, shortness of breath, or dizziness.  No urinary symptoms, nausea, vomiting, or diarrhea.  No sick contacts, recent travel, or known exposure to COVID-19 infection.  ER course: Vital signs show blood pressure 116/78, heart rate 64, respirations 15, and oxygen saturation 98% on room air.  Patient is afebrile.  Complete blood count shows hemoglobin of 9.7, otherwise unremarkable.  Complete metabolic panel was mostly within normal limits.  Patient's pain persists despite IV Dilaudid, IV fluids, and IV Toradol.  Admit to [facility] for further management of sickle cell pain crisis.   Review of systems:  In addition to the HPI above, patient reports No fever or chills No Headache, No changes with vision or hearing No problems swallowing food or liquids No chest pain, cough or shortness of breath No abdominal pain, No nausea or vomiting, Bowel movements are regular No blood in stool or urine No dysuria No new skin rashes or bruises No new joints pains-aches No new weakness, tingling, numbness in any extremity No recent weight gain or loss No  polyuria, polydypsia or polyphagia No significant Mental Stressors  With Past History of the following :   Past Medical History:  Diagnosis Date   Anxiety    Headache(784.0)    Heart murmur    Sickle cell crisis ([place])    Syphilis 2015   Was diagnosed and received one injection of antibiotics   Thrombocytosis 11/22/2014     CBC Latest Ref Rng & Units 12/13/2018 12/11/2018 12/10/2018 WBC 4.0 - 10.5 K/uL 14.8(H) 13.2(H) 15.9(H) Hemoglobin 12.0 - 15.0 g/dL 8.2(L) 7.7(L) 8.1(L) Hematocrit 36.0 - 46.0 % 23.7(L) 23.2(L) 24.5(L) Platelets 150 - 400 K/uL 326 399 388        Past Surgical History:  Procedure Laterality Date   CESAREAN SECTION N/A 02/05/2019   Procedure: CESAREAN SECTION;  Surgeon: [name], MD;  Location: [facility] LD ORS;  Service: Obstetrics;  Laterality: N/A;   CHOLECYSTECTOMY N/A 11/30/2014   Procedure: LAPAROSCOPIC CHOLECYSTECTOMY SINGLE SITE WITH INTRAOPERATIVE CHOLANGIOGRAM;  Surgeon: Michael Boston, MD;  Location: [facility] ORS;  Service: General;  Laterality: N/A;   SPLENECTOMY       Social History:   Social History   Tobacco Use   Smoking status: Some Days    Types: Cigarettes   Smokeless tobacco: Never  Substance Use Topics   Alcohol use: No     Lives - At home   Family History :   Family History  Problem Relation Age of Onset   Hypertension Mother    Sickle cell anemia Sister    Kidney disease  Sister        Lupus   Arthritis Sister    Sickle cell anemia Sister    Sickle cell trait Sister    Heart disease Maternal Aunt        CABG   Heart disease Maternal Uncle        CABG   Lupus Sister      Home Medications:   Prior to Admission medications   Medication Sig Start Date End Date Taking? Authorizing Provider  Aspirin-Acetaminophen-Caffeine (EXCEDRIN PO) Take 1-2 tablets by mouth every 6 (six) hours as needed (pain).   Yes [provider]  citalopram (CELEXA) 20 MG tablet TAKE 1 TABLET BY MOUTH EVERY DAY Patient taking differently: Take 20 mg by  mouth daily. 02/24/21  Yes [name], FNP  cyclobenzaprine (FLEXERIL) 10 MG tablet TAKE 1 TABLET BY MOUTH TWICE A DAY Patient taking differently: Take 10 mg by mouth daily as needed for muscle spasms. 03/25/21  Yes Vevelyn Francois, NP  folic acid (FOLVITE) 1 MG tablet Take 1 tablet (1 mg total) by mouth daily. 03/15/19  Yes Lanae Boast, FNP  morphine (MS CONTIN) 60 MG 12 hr tablet Take 1 tablet (60 mg total) by mouth every 12 (twelve) hours. 06/29/21 07/29/21 Yes Vevelyn Francois, NP  Oxycodone HCl 10 MG TABS Take 1 tablet (10 mg total) by mouth every 6 (six) hours as needed for up to 15 days (pain). 07/25/21 08/09/21 Yes Vevelyn Francois, NP  Prenatal Vit-Fe Fumarate-FA (PRENATAL VITAMIN PLUS LOW IRON) 27-1 MG TABS TAKE 1 TABLET BY MOUTH EVERY DAY Patient taking differently: Take 1 tablet by mouth daily. 03/03/20  Yes Shelly Bombard, MD  Vitamin D, Ergocalciferol, (DRISDOL) 1.25 MG (50000 UNIT) CAPS capsule TAKE ONE CAPSULE BY MOUTH ONE TIME PER WEEK Patient taking differently: Take 50,000 Units by mouth every Wednesday. 09/03/20  Yes Vevelyn Francois, NP     Allergies:   Allergies  Allergen Reactions   Ketamine Hives and Other (See Comments)    Makes feel funny     Physical Exam:   Vitals:   Vitals:   07/24/21 1308 07/24/21 1345  BP:  116/78  Pulse: (!) 59 74  Resp: 15 15  Temp:    SpO2: 99% 98%    Physical Exam: Constitutional: Patient appears well-developed and well-nourished. Not in obvious distress. HENT: Normocephalic, atraumatic, External right and left ear normal. Oropharynx is clear and moist.  Eyes: Conjunctivae and EOM are normal. PERRLA, no scleral icterus. Neck: Normal ROM. Neck supple. No JVD. No tracheal deviation. No thyromegaly. CVS: RRR, S1/S2 +, no murmurs, no gallops, no carotid bruit.  Pulmonary: Effort and breath sounds normal, no stridor, rhonchi, wheezes, rales.  Abdominal: Soft. BS +, no distension, tenderness, rebound or guarding.  Musculoskeletal:  Normal range of motion. No edema and no tenderness.  Lymphadenopathy: No lymphadenopathy noted, cervical, inguinal or axillary Neuro: Alert. Normal reflexes, muscle tone coordination. No cranial nerve deficit. Skin: Skin is warm and dry. No rash noted. Not diaphoretic. No erythema. No pallor. Psychiatric: Normal mood and affect. Behavior, judgment, thought content normal.   Data Review:   CBC Recent Labs  Lab 07/24/21 0925  WBC 9.0  HGB 9.7*  HCT 27.4*  PLT 455*  MCV 84.3  MCH 29.8  MCHC 35.4  RDW 15.2  LYMPHSABS 2.9  MONOABS 1.1*  EOSABS 0.1  BASOSABS 0.1   ------------------------------------------------------------------------------------------------------------------  Chemistries  Recent Labs  Lab 07/24/21 0925  NA 142  K 4.3  CL 112*  CO2 23  GLUCOSE 104*  BUN 11  CREATININE 0.56  CALCIUM 9.7  AST 22  ALT 16  ALKPHOS 54  BILITOT 0.8   ------------------------------------------------------------------------------------------------------------------ estimated creatinine clearance is 107.4 mL/min (by C-G formula based on SCr of 0.56 mg/dL). ------------------------------------------------------------------------------------------------------------------ No results for input(s): TSH, T4TOTAL, T3FREE, THYROIDAB in the last 72 hours.  Invalid input(s): FREET3  Coagulation profile No results for input(s): INR, PROTIME in the last 168 hours. ------------------------------------------------------------------------------------------------------------------- No results for input(s): DDIMER in the last 72 hours. -------------------------------------------------------------------------------------------------------------------  Cardiac Enzymes No results for input(s): CKMB, TROPONINI, MYOGLOBIN in the last 168 hours.  Invalid input(s): CK ------------------------------------------------------------------------------------------------------------------ No results  found for: BNP  ---------------------------------------------------------------------------------------------------------------  Urinalysis    Component Value Date/Time   COLORURINE YELLOW 06/19/2021 1707   APPEARANCEUR HAZY (A) 06/19/2021 1707   LABSPEC 1.012 06/19/2021 1707   PHURINE 5.0 06/19/2021 1707   GLUCOSEU NEGATIVE 06/19/2021 1707   HGBUR NEGATIVE 06/19/2021 1707   BILIRUBINUR NEGATIVE 06/19/2021 1707   BILIRUBINUR Negative 08/10/2019 [place] 06/19/2021 1707   PROTEINUR NEGATIVE 06/19/2021 1707   UROBILINOGEN 1.0 08/10/2019 1534   UROBILINOGEN 1.0 01/12/2018 0943   NITRITE NEGATIVE 06/19/2021 1707   LEUKOCYTESUR NEGATIVE 06/19/2021 1707    ----------------------------------------------------------------------------------------------------------------   Imaging Results:    No results found.   Assessment & Plan:  Principal Problem:   Sickle cell pain crisis ([place]) Active Problems:   Chronic musculoskeletal pain   Depression  Sickle cell disease with pain crisis: Admit to MedSurg due to persistent pain.  Initiate IV Dilaudid PCA Toradol 15 mg IV every 6 hours Oxycodone 10 mg every 6 hours as needed for severe breakthrough pain MS Contin 60 mg every 12 hours Monitor vital signs very closely, reevaluate pain scale regularly, and supplemental oxygen as needed  Anemia of chronic disease: Hemoglobin is stable and consistent with patient's baseline.  There is no clinical indication for blood transfusion at this time.  Continue to follow closely.  History of depression: Continue home medications.  Patient is not having any suicidal or homicidal intentions at this time.  Chronic pain syndrome: Continue home medications   DVT Prophylaxis: Subcut Lovenox and SCDs  AM Labs Ordered, also please review Full Orders  Family Communication: Admission, patient's condition and plan of care including tests being ordered have been discussed with the patient  who indicate understanding and agree with the plan and Code Status.  Code Status: Full Code  Consults called: None    Admission status: Inpatient    Time spent in minutes : 30 minutes  [place], MSN, FNP-C [facility] [address] [place], [place] [address] [phone]  07/24/2021 at 3:47 PM

## 2021-07-24 NOTE — ED Notes (Signed)
Provider at the bedside.  

## 2021-07-24 NOTE — ED Notes (Signed)
Transport paged. They are on the way.

## 2021-07-24 NOTE — ED Notes (Signed)
Hospitalist at the bedside 

## 2021-07-24 NOTE — ED Notes (Signed)
Provider the bedside to re-evaluate.

## 2021-07-25 LAB — CBC
HCT: 25 % — ABNORMAL LOW (ref 36.0–46.0)
Hemoglobin: 8.8 g/dL — ABNORMAL LOW (ref 12.0–15.0)
MCH: 29.7 pg (ref 26.0–34.0)
MCHC: 35.2 g/dL (ref 30.0–36.0)
MCV: 84.5 fL (ref 80.0–100.0)
Platelets: 434 10*3/uL — ABNORMAL HIGH (ref 150–400)
RBC: 2.96 MIL/uL — ABNORMAL LOW (ref 3.87–5.11)
RDW: 15.4 % (ref 11.5–15.5)
WBC: 12.6 10*3/uL — ABNORMAL HIGH (ref 4.0–10.5)
nRBC: 1.5 % — ABNORMAL HIGH (ref 0.0–0.2)

## 2021-07-25 NOTE — Plan of Care (Signed)

## 2021-07-25 NOTE — Progress Notes (Signed)
Subjective: A 30 year old female admitted with sickle cell painful crisis.  Patient is still having pain at 8 out of 10.  Mainly on her back and legs.  No fever or chills no nausea vomiting or diarrhea.  Objective: Vital signs in last 24 hours: Temp:  [97.9 F (36.6 C)-99 F (37.2 C)] 98.2 F (36.8 C) (09/24 0922) Pulse Rate:  [60-80] 70 (09/24 0922) Resp:  [13-20] 14 (09/24 1727) BP: (106-129)/(75-90) 118/75 (09/24 0922) SpO2:  [96 %-100 %] 98 % (09/24 1727) Weight change:  Last BM Date: 07/24/21  Intake/Output from previous day: 09/23 0701 - 09/24 0700 In: 1525.3 [P.O.:600; I.V.:925.3] Out: 200 [Urine:200] Intake/Output this shift: Total I/O In: 240 [P.O.:240] Out: -   General appearance: alert, cooperative, appears stated age, and no distress Nose: Nares normal. Septum midline. Mucosa normal. No drainage or sinus tenderness. Back: symmetric, no curvature. ROM normal. No CVA tenderness. Resp: clear to auscultation bilaterally Cardio: regular rate and rhythm, S1, S2 normal, no murmur, click, rub or gallop GI: soft, non-tender; bowel sounds normal; no masses,  no organomegaly Extremities: extremities normal, atraumatic, no cyanosis or edema Pulses: 2+ and symmetric Skin: Skin color, texture, turgor normal. No rashes or lesions Neurologic: Grossly normal  Lab Results: Recent Labs    07/24/21 0925 07/25/21 0547  WBC 9.0 12.6*  HGB 9.7* 8.8*  HCT 27.4* 25.0*  PLT 455* 434*   BMET Recent Labs    07/24/21 0925  NA 142  K 4.3  CL 112*  CO2 23  GLUCOSE 104*  BUN 11  CREATININE 0.56  CALCIUM 9.7    Studies/Results: No results found.  Medications: I have reviewed the patient's current medications.  Assessment/Plan: A 30 year old female admitted with sickle cell painful crisis.  #1 sickle cell painful crisis: Patient admitted and is currently on Dilaudid PCA, Toradol 15 mg every 6 hours, oxycodone every 6 hours as well as MS Contin 60 mg every 12 hours.   Still it 8 out of 10 pain.  Close monitoring.  Patient would likely will require couple of days hospitalization as usual.  #2 anemia of chronic disease: Hemoglobin is at 8.8.  It was 9.7 yesterday.  Continue monitoring.    #3 leukocytosis: Most likely due to vaso-occlusive crisis.  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  Continue to monitor  #5 history of depression: Continue home regimen  LOS: 1 day   Yohance Hathorne,LAWAL 07/25/2021, 5:46 PM

## 2021-07-26 ENCOUNTER — Other Ambulatory Visit: Payer: Self-pay | Admitting: Nurse Practitioner

## 2021-07-26 DIAGNOSIS — M545 Low back pain, unspecified: Secondary | ICD-10-CM

## 2021-07-26 DIAGNOSIS — G8929 Other chronic pain: Secondary | ICD-10-CM

## 2021-07-26 LAB — CBC WITH DIFFERENTIAL/PLATELET
Abs Immature Granulocytes: 0.04 10*3/uL (ref 0.00–0.07)
Basophils Absolute: 0 10*3/uL (ref 0.0–0.1)
Basophils Relative: 0 %
Eosinophils Absolute: 0.4 10*3/uL (ref 0.0–0.5)
Eosinophils Relative: 5 %
HCT: 24.6 % — ABNORMAL LOW (ref 36.0–46.0)
Hemoglobin: 8.9 g/dL — ABNORMAL LOW (ref 12.0–15.0)
Immature Granulocytes: 0 %
Lymphocytes Relative: 46 %
Lymphs Abs: 4.3 10*3/uL — ABNORMAL HIGH (ref 0.7–4.0)
MCH: 30.6 pg (ref 26.0–34.0)
MCHC: 36.2 g/dL — ABNORMAL HIGH (ref 30.0–36.0)
MCV: 84.5 fL (ref 80.0–100.0)
Monocytes Absolute: 1.1 10*3/uL — ABNORMAL HIGH (ref 0.1–1.0)
Monocytes Relative: 11 %
Neutro Abs: 3.5 10*3/uL (ref 1.7–7.7)
Neutrophils Relative %: 38 %
Platelets: 453 10*3/uL — ABNORMAL HIGH (ref 150–400)
RBC: 2.91 MIL/uL — ABNORMAL LOW (ref 3.87–5.11)
RDW: 15.6 % — ABNORMAL HIGH (ref 11.5–15.5)
WBC: 9.4 10*3/uL (ref 4.0–10.5)
nRBC: 1.1 % — ABNORMAL HIGH (ref 0.0–0.2)

## 2021-07-26 LAB — COMPREHENSIVE METABOLIC PANEL
ALT: 17 U/L (ref 0–44)
AST: 23 U/L (ref 15–41)
Albumin: 3.7 g/dL (ref 3.5–5.0)
Alkaline Phosphatase: 51 U/L (ref 38–126)
Anion gap: 9 (ref 5–15)
BUN: 10 mg/dL (ref 6–20)
CO2: 26 mmol/L (ref 22–32)
Calcium: 8.9 mg/dL (ref 8.9–10.3)
Chloride: 101 mmol/L (ref 98–111)
Creatinine, Ser: 0.45 mg/dL (ref 0.44–1.00)
GFR, Estimated: >60 mL/min (ref >60)
Glucose, Bld: 103 mg/dL — ABNORMAL HIGH (ref 70–99)
Potassium: 3.7 mmol/L (ref 3.5–5.1)
Sodium: 136 mmol/L (ref 135–145)
Total Bilirubin: 1.2 mg/dL (ref 0.3–1.2)
Total Protein: 6.8 g/dL (ref 6.5–8.1)

## 2021-07-26 LAB — URINE CULTURE

## 2021-07-26 NOTE — Progress Notes (Signed)
Patient states she does not have a legal guardian.

## 2021-07-26 NOTE — Progress Notes (Signed)
Patient refusing telemetry monitoring ,continuos to be rude, uncooperative, hostile towards staff . She will not keep her End-Titled monitoring on. She continues to refuse to cooperate . Discussed with charge nurse. She is currently on the monitor but intermittently takes it off.  I have reassured patient the benefits of monitoring and compliance with treatment plan . Will pass on to provider in am. She continues to be uncooperative with care.

## 2021-07-26 NOTE — Progress Notes (Signed)
Discharge Instructions explained to patient. No prescriptions to pick up. Analyssa asked for a bus pass home, after she was given the bus pass, she called and got a family member to give her a ride. Patient was discharged via wheelchair. States her pain is a 4/10.

## 2021-07-26 NOTE — Discharge Summary (Signed)
Physician Discharge Summary  Patient ID: Carmen [name] MRN: [id number] DOB/AGE: 07/17/1991 30 y.o.  Admit date: 07/24/2021 Discharge date: 07/26/2021  Admission Diagnoses:  Discharge Diagnoses:  Principal Problem:   Sickle cell pain crisis [facility]) Active Problems:   Chronic musculoskeletal pain   Depression   Discharged Condition: good  Hospital Course: Patient is a 30 year old female who was admitted with sickle cell painful crisis.  She has had chronic sickle cell disease with chronic pain syndrome, depression as well as anxiety disorder.  Patient was admitted treated with IV Dilaudid PCA, Toradol and IV fluids patient also was treated with home regimen.  She improved slowly and transition to her home regimen only.  Patient felt better and discharged home to follow-up with PCP.  She denied any fever or chills she denied any nausea vomiting or diarrhea.  Consults: None  Significant Diagnostic Studies: labs: Serial CBCs and CMP's, no transfusion required  Treatments: IV hydration and analgesia: acetaminophen and Dilaudid  Discharge Exam: Blood pressure 112/64, pulse 63, temperature 98.8 F (37.1 C), temperature source Oral, resp. rate 18, height 5\' 3"  (1.6 m), weight 83.9 kg, last menstrual period 06/25/2021, SpO2 98 %. General appearance: alert, cooperative, appears stated age, and no distress Neck: no adenopathy, no carotid bruit, no JVD, supple, symmetrical, trachea midline, and thyroid not enlarged, symmetric, no tenderness/mass/nodules Resp: clear to auscultation bilaterally Cardio: regular rate and rhythm, S1, S2 normal, no murmur, click, rub or gallop GI: soft, non-tender; bowel sounds normal; no masses,  no organomegaly Extremities: extremities normal, atraumatic, no cyanosis or edema Pulses: 2+ and symmetric Skin: Skin color, texture, turgor normal. No rashes or lesions Neurologic: Grossly normal  Disposition: Discharge disposition: 01-Home or Self  Care       Discharge Instructions     Diet - low sodium heart healthy   Complete by: As directed    Increase activity slowly   Complete by: As directed       Allergies as of 07/26/2021       Reactions   Ketamine Hives, Other (See Comments)   Makes feel funny        Medication List     TAKE these medications    citalopram 20 MG tablet Commonly known as: CELEXA TAKE 1 TABLET BY MOUTH EVERY DAY   cyclobenzaprine 10 MG tablet Commonly known as: FLEXERIL TAKE 1 TABLET BY MOUTH TWICE A DAY What changed:  when to take this reasons to take this   EXCEDRIN PO Take 1-2 tablets by mouth every 6 (six) hours as needed (pain).   folic acid 1 MG tablet Commonly known as: FOLVITE Take 1 tablet (1 mg total) by mouth daily.   morphine 60 MG 12 hr tablet Commonly known as: MS Contin Take 1 tablet (60 mg total) by mouth every 12 (twelve) hours.   Oxycodone HCl 10 MG Tabs Take 1 tablet (10 mg total) by mouth every 6 (six) hours as needed for up to 15 days (pain).   Prenatal Vitamin Plus Low Iron 27-1 MG Tabs TAKE 1 TABLET BY MOUTH EVERY DAY   Vitamin D (Ergocalciferol) 1.25 MG (50000 UNIT) Caps capsule Commonly known as: DRISDOL TAKE ONE CAPSULE BY MOUTH ONE TIME PER WEEK What changed:  how much to take how to take this when to take this additional instructions         Signed: Zaden Sako,LAWAL 07/26/2021, 10:04 AM  Time spent 34 minutes

## 2021-07-26 NOTE — Progress Notes (Signed)
CSW provided bus pass for ride home upon d/c .   Arlie Solomons.Willam Munford, MSW, [place]  Transitions of Care Clinical Social Worker I Direct Dial: [phone]  Fax: [phone] [email or web address]

## 2021-07-27 ENCOUNTER — Other Ambulatory Visit: Payer: Self-pay

## 2021-07-27 DIAGNOSIS — Z79891 Long term (current) use of opiate analgesic: Secondary | ICD-10-CM

## 2021-07-27 DIAGNOSIS — D571 Sickle-cell disease without crisis: Secondary | ICD-10-CM

## 2021-07-30 ENCOUNTER — Other Ambulatory Visit: Payer: Self-pay | Admitting: Internal Medicine

## 2021-07-30 ENCOUNTER — Telehealth (HOSPITAL_COMMUNITY): Payer: Self-pay | Admitting: *Deleted

## 2021-07-30 ENCOUNTER — Ambulatory Visit: Payer: Self-pay | Admitting: Nurse Practitioner

## 2021-07-30 DIAGNOSIS — D571 Sickle-cell disease without crisis: Secondary | ICD-10-CM

## 2021-07-30 DIAGNOSIS — Z79891 Long term (current) use of opiate analgesic: Secondary | ICD-10-CM

## 2021-07-30 MED ORDER — MORPHINE SULFATE ER 60 MG PO TBCR: 60.0000 mg | EXTENDED_RELEASE_TABLET | Freq: Two times a day (BID) | ORAL | 0 refills | Status: DC

## 2021-07-30 NOTE — Progress Notes (Signed)
Morphine ER 60 mg bid x 60 tablets (30 Days supply refilled today.  Angelica Chessman, MD  07/30/21 9:32 AM

## 2021-07-30 NOTE — Telephone Encounter (Signed)
Patient called requesting to come to the day hospital for sickle cell pain. Due to staffing the day hospital is unable to accept additional patients. Patient advised and expresses an understanding.

## 2021-08-05 ENCOUNTER — Ambulatory Visit: Payer: Self-pay | Admitting: Nurse Practitioner

## 2021-08-05 ENCOUNTER — Other Ambulatory Visit: Payer: Self-pay

## 2021-08-06 MED ORDER — OXYCODONE HCL 10 MG PO TABS: 10.0000 mg | ORAL_TABLET | Freq: Four times a day (QID) | ORAL | 0 refills | Status: DC | PRN

## 2021-08-20 ENCOUNTER — Other Ambulatory Visit: Payer: Self-pay

## 2021-08-21 ENCOUNTER — Encounter (HOSPITAL_COMMUNITY): Payer: Self-pay

## 2021-08-21 ENCOUNTER — Inpatient Hospital Stay (HOSPITAL_COMMUNITY)
Admission: EM | Admit: 2021-08-21 | Discharge: 2021-08-24 | DRG: 812 | Disposition: A | Discharge status: Home / Self Care | Payer: Medicaid Other | Attending: Internal Medicine | Admitting: Internal Medicine

## 2021-08-21 DIAGNOSIS — Z832 Family history of diseases of the blood and blood-forming organs and certain disorders involving the immune mechanism: Secondary | ICD-10-CM

## 2021-08-21 DIAGNOSIS — Z20822 Contact with and (suspected) exposure to covid-19: Secondary | ICD-10-CM | POA: Diagnosis present

## 2021-08-21 DIAGNOSIS — E559 Vitamin D deficiency, unspecified: Secondary | ICD-10-CM | POA: Diagnosis present

## 2021-08-21 DIAGNOSIS — Z79899 Other long term (current) drug therapy: Secondary | ICD-10-CM | POA: Diagnosis not present

## 2021-08-21 DIAGNOSIS — F17210 Nicotine dependence, cigarettes, uncomplicated: Secondary | ICD-10-CM | POA: Diagnosis present

## 2021-08-21 DIAGNOSIS — D75838 Other thrombocytosis: Secondary | ICD-10-CM | POA: Diagnosis present

## 2021-08-21 DIAGNOSIS — F322 Major depressive disorder, single episode, severe without psychotic features: Secondary | ICD-10-CM | POA: Diagnosis present

## 2021-08-21 DIAGNOSIS — G894 Chronic pain syndrome: Secondary | ICD-10-CM | POA: Diagnosis present

## 2021-08-21 DIAGNOSIS — D5700 Hb-SS disease with crisis, unspecified: Principal | ICD-10-CM | POA: Diagnosis present

## 2021-08-21 DIAGNOSIS — D638 Anemia in other chronic diseases classified elsewhere: Secondary | ICD-10-CM | POA: Diagnosis present

## 2021-08-21 DIAGNOSIS — K5900 Constipation, unspecified: Secondary | ICD-10-CM | POA: Diagnosis present

## 2021-08-21 DIAGNOSIS — F331 Major depressive disorder, recurrent, moderate: Secondary | ICD-10-CM | POA: Diagnosis present

## 2021-08-21 LAB — CBC WITH DIFFERENTIAL/PLATELET
Abs Immature Granulocytes: 0.02 10*3/uL (ref 0.00–0.07)
Basophils Absolute: 0.1 10*3/uL (ref 0.0–0.1)
Basophils Relative: 1 %
Eosinophils Absolute: 0.2 10*3/uL (ref 0.0–0.5)
Eosinophils Relative: 2 %
HCT: 29.1 % — ABNORMAL LOW (ref 36.0–46.0)
Hemoglobin: 10.3 g/dL — ABNORMAL LOW (ref 12.0–15.0)
Immature Granulocytes: 0 %
Lymphocytes Relative: 39 %
Lymphs Abs: 3.8 10*3/uL (ref 0.7–4.0)
MCH: 29.1 pg (ref 26.0–34.0)
MCHC: 35.4 g/dL (ref 30.0–36.0)
MCV: 82.2 fL (ref 80.0–100.0)
Monocytes Absolute: 1 10*3/uL (ref 0.1–1.0)
Monocytes Relative: 10 %
Neutro Abs: 4.6 10*3/uL (ref 1.7–7.7)
Neutrophils Relative %: 48 %
Platelets: 522 10*3/uL — ABNORMAL HIGH (ref 150–400)
RBC: 3.54 MIL/uL — ABNORMAL LOW (ref 3.87–5.11)
RDW: 14.6 % (ref 11.5–15.5)
WBC: 9.7 10*3/uL (ref 4.0–10.5)
nRBC: 0.4 % — ABNORMAL HIGH (ref 0.0–0.2)

## 2021-08-21 LAB — RETICULOCYTES
Immature Retic Fract: 33.8 % — ABNORMAL HIGH (ref 2.3–15.9)
RBC.: 3.55 MIL/uL — ABNORMAL LOW (ref 3.87–5.11)
Retic Count, Absolute: 177.9 10*3/uL (ref 19.0–186.0)
Retic Ct Pct: 5 % — ABNORMAL HIGH (ref 0.4–3.1)

## 2021-08-21 LAB — COMPREHENSIVE METABOLIC PANEL
ALT: 31 U/L (ref 0–44)
AST: 27 U/L (ref 15–41)
Albumin: 4.4 g/dL (ref 3.5–5.0)
Alkaline Phosphatase: 53 U/L (ref 38–126)
Anion gap: 9 (ref 5–15)
BUN: 14 mg/dL (ref 6–20)
CO2: 17 mmol/L — ABNORMAL LOW (ref 22–32)
Calcium: 9.2 mg/dL (ref 8.9–10.3)
Chloride: 112 mmol/L — ABNORMAL HIGH (ref 98–111)
Creatinine, Ser: 0.44 mg/dL (ref 0.44–1.00)
GFR, Estimated: >60 mL/min (ref >60)
Glucose, Bld: 83 mg/dL (ref 70–99)
Potassium: 3.7 mmol/L (ref 3.5–5.1)
Sodium: 138 mmol/L (ref 135–145)
Total Bilirubin: 1.3 mg/dL — ABNORMAL HIGH (ref 0.3–1.2)
Total Protein: 8.2 g/dL — ABNORMAL HIGH (ref 6.5–8.1)

## 2021-08-21 LAB — RESP PANEL BY RT-PCR (FLU A&B, COVID) ARPGX2
Influenza A by PCR: NEGATIVE
Influenza B by PCR: NEGATIVE
SARS Coronavirus 2 by RT PCR: NEGATIVE

## 2021-08-21 LAB — I-STAT BETA HCG BLOOD, ED (MC, WL, AP ONLY): I-stat hCG, quantitative: <5 m[IU]/mL (ref <5)

## 2021-08-21 MED ORDER — ENOXAPARIN SODIUM 40 MG/0.4ML IJ SOSY
40.0000 mg | PREFILLED_SYRINGE | INTRAMUSCULAR | Status: DC
  Filled 2021-08-21 (×2): qty 0.4

## 2021-08-21 MED ORDER — SODIUM CHLORIDE 0.45 % IV SOLN: INTRAVENOUS | Status: DC

## 2021-08-21 MED ORDER — DIPHENHYDRAMINE HCL 50 MG/ML IJ SOLN
25.0000 mg | Freq: Once | INTRAMUSCULAR | Status: AC
  Administered 2021-08-21: 25 mg via INTRAVENOUS
  Filled 2021-08-21: qty 1

## 2021-08-21 MED ORDER — HYDROMORPHONE HCL 2 MG/ML IJ SOLN
2.0000 mg | INTRAMUSCULAR | Status: AC
  Administered 2021-08-21: 2 mg via INTRAVENOUS
  Filled 2021-08-21: qty 1

## 2021-08-21 MED ORDER — POLYETHYLENE GLYCOL 3350 17 G PO PACK
17.0000 g | PACK | Freq: Every day | ORAL | Status: DC | PRN
  Administered 2021-08-22: 17 g via ORAL
  Filled 2021-08-21: qty 1

## 2021-08-21 MED ORDER — MORPHINE SULFATE ER 15 MG PO TBCR
60.0000 mg | EXTENDED_RELEASE_TABLET | Freq: Two times a day (BID) | ORAL | Status: DC
  Administered 2021-08-21 – 2021-08-24 (×6): 60 mg via ORAL
  Filled 2021-08-21 (×6): qty 4

## 2021-08-21 MED ORDER — DIPHENHYDRAMINE HCL 25 MG PO CAPS: 25.0000 mg | ORAL_CAPSULE | ORAL | Status: DC | PRN

## 2021-08-21 MED ORDER — CITALOPRAM HYDROBROMIDE 20 MG PO TABS
20.0000 mg | ORAL_TABLET | Freq: Every day | ORAL | Status: DC
  Administered 2021-08-21 – 2021-08-24 (×4): 20 mg via ORAL
  Filled 2021-08-21 (×4): qty 1

## 2021-08-21 MED ORDER — CYCLOBENZAPRINE HCL 10 MG PO TABS
10.0000 mg | ORAL_TABLET | Freq: Two times a day (BID) | ORAL | Status: DC
  Administered 2021-08-21 – 2021-08-24 (×6): 10 mg via ORAL
  Filled 2021-08-21 (×6): qty 1

## 2021-08-21 MED ORDER — HYDROMORPHONE HCL 2 MG/ML IJ SOLN: 2.0000 mg | INTRAMUSCULAR | Status: DC | PRN

## 2021-08-21 MED ORDER — KETOROLAC TROMETHAMINE 15 MG/ML IJ SOLN
15.0000 mg | Freq: Four times a day (QID) | INTRAMUSCULAR | Status: DC
Start: 2021-08-21 — End: 2021-08-24
  Administered 2021-08-21 – 2021-08-24 (×11): 15 mg via INTRAVENOUS
  Filled 2021-08-21 (×11): qty 1

## 2021-08-21 MED ORDER — NALOXONE HCL 0.4 MG/ML IJ SOLN: 0.4000 mg | INTRAMUSCULAR | Status: DC | PRN

## 2021-08-21 MED ORDER — SODIUM CHLORIDE 0.9% FLUSH: 9.0000 mL | INTRAVENOUS | Status: DC | PRN

## 2021-08-21 MED ORDER — ONDANSETRON HCL 4 MG/2ML IJ SOLN
4.0000 mg | Freq: Four times a day (QID) | INTRAMUSCULAR | Status: DC | PRN
  Administered 2021-08-22: 4 mg via INTRAVENOUS
  Filled 2021-08-21: qty 2

## 2021-08-21 MED ORDER — SENNOSIDES-DOCUSATE SODIUM 8.6-50 MG PO TABS
1.0000 | ORAL_TABLET | Freq: Two times a day (BID) | ORAL | Status: DC
Start: 2021-08-21 — End: 2021-08-24
  Administered 2021-08-21 – 2021-08-23 (×5): 1 via ORAL
  Filled 2021-08-21 (×6): qty 1

## 2021-08-21 MED ORDER — HYDROMORPHONE 1 MG/ML IV SOLN
INTRAVENOUS | Status: DC
  Administered 2021-08-21: 30 mg via INTRAVENOUS
  Administered 2021-08-22: 6.5 mg via INTRAVENOUS
  Administered 2021-08-22: 3 mg via INTRAVENOUS
  Administered 2021-08-22: 4.5 mg via INTRAVENOUS
  Filled 2021-08-21: qty 30

## 2021-08-21 MED ORDER — FOLIC ACID 1 MG PO TABS
1.0000 mg | ORAL_TABLET | Freq: Every day | ORAL | Status: DC
  Administered 2021-08-21 – 2021-08-24 (×4): 1 mg via ORAL
  Filled 2021-08-21 (×4): qty 1

## 2021-08-21 NOTE — ED Triage Notes (Signed)
Pt arrived via POV, c/o SCC. Pain primarily in back.

## 2021-08-21 NOTE — H&P (Signed)
H&P  Patient Demographics:  Carmen [name], is a 30 y.o. female  MRN: [id number]   DOB - 1991/07/08  Admit Date - 08/21/2021  Outpatient Primary MD for the patient is Vevelyn Francois, NP  Chief Complaint  Patient presents with   Sickle Cell Pain Crisis      HPI:   Carmen [name]  is a 30 y.o. female with a medical history significant for sickle cell disease, chronic pain syndrome, opiate dependence and tolerance, history of major depression, and history of anemia of chronic disease presents to the ER with complaints of allover body pain over the past 24 hours.  Patient states that she slept with her windows with intense generalized pain.  She characterizes pain as constant, aching, and occasionally sharp.  She rates pain as 9/10.  She says that she has been taking her home medication that include MS Contin 60 mg and oxycodone as prescribed without sustained relief.  Patient denies any headache, shortness of breath, or dizziness.,  Nausea, vomiting, or diarrhea.  No sick contacts, recent travel, or known exposure to COVID-19.  ER course: Vital signs recorded as: BP (!) 110/34   Pulse 74   Temp 98.5 F (36.9 C) (Oral)   Resp 19   SpO2 98%  Reviewed all laboratory values within normal limits.  Patient received 2 doses of Dilaudid 2 mg IV and oxycodone without any relief.  Admitted to [place] for further management of sickle cell pain crisis.  Review of systems:  In addition to the HPI above, patient reports No fever or chills No Headache, No changes with vision or hearing No problems swallowing food or liquids No chest pain, cough or shortness of breath No abdominal pain, No nausea or vomiting, Bowel movements are regular No blood in stool or urine No dysuria No new skin rashes or bruises No new joints pains-aches No new weakness, tingling, numbness in any extremity No recent weight gain or loss No polyuria, polydypsia or polyphagia No significant Mental Stressors   With Past  History of the following :   Past Medical History:  Diagnosis Date   Anxiety    Headache(784.0)    Heart murmur    Sickle cell crisis ([place])    Syphilis 2015   Was diagnosed and received one injection of antibiotics   Thrombocytosis 11/22/2014     CBC Latest Ref Rng & Units 12/13/2018 12/11/2018 12/10/2018 WBC 4.0 - 10.5 K/uL 14.8(H) 13.2(H) 15.9(H) Hemoglobin 12.0 - 15.0 g/dL 8.2(L) 7.7(L) 8.1(L) Hematocrit 36.0 - 46.0 % 23.7(L) 23.2(L) 24.5(L) Platelets 150 - 400 K/uL [phone]        Past Surgical History:  Procedure Laterality Date   CESAREAN SECTION N/A 02/05/2019   Procedure: CESAREAN SECTION;  Surgeon: [name], MD;  Location: [facility];  Service: Obstetrics;  Laterality: N/A;   CHOLECYSTECTOMY N/A 11/30/2014   Procedure: LAPAROSCOPIC CHOLECYSTECTOMY SINGLE SITE WITH INTRAOPERATIVE CHOLANGIOGRAM;  Surgeon: Michael Boston, MD;  Location: [facility] ORS;  Service: General;  Laterality: N/A;   SPLENECTOMY       Social History:   Social History   Tobacco Use   Smoking status: Some Days    Types: Cigarettes   Smokeless tobacco: Never  Substance Use Topics   Alcohol use: No     Lives - At home   Family History :   Family History  Problem Relation Age of Onset   Hypertension Mother    Sickle cell anemia Sister    Kidney disease Sister  Lupus   Arthritis Sister    Sickle cell anemia Sister    Sickle cell trait Sister    Heart disease Maternal Aunt        CABG   Heart disease Maternal Uncle        CABG   Lupus Sister      Home Medications:   Prior to Admission medications   Medication Sig Start Date End Date Taking? Authorizing Provider  Aspirin-Acetaminophen-Caffeine (EXCEDRIN PO) Take 1-2 tablets by mouth every 6 (six) hours as needed (pain).   Yes [provider]  citalopram (CELEXA) 20 MG tablet TAKE 1 TABLET BY MOUTH EVERY DAY Patient taking differently: Take 20 mg by mouth daily. 02/24/21  Yes [name], FNP  cyclobenzaprine (FLEXERIL) 10  MG tablet TAKE 1 TABLET BY MOUTH TWICE A DAY Patient taking differently: Take 10 mg by mouth in the morning and at bedtime. 07/27/21  Yes Vevelyn Francois, NP  folic acid (FOLVITE) 1 MG tablet Take 1 tablet (1 mg total) by mouth daily. 03/15/19  Yes Lanae Boast, FNP  morphine (MS CONTIN) 60 MG 12 hr tablet Take 1 tablet (60 mg total) by mouth every 12 (twelve) hours. 07/30/21 08/29/21 Yes Tresa Garter, MD  Oxycodone HCl 10 MG TABS Take 1 tablet (10 mg total) by mouth every 6 (six) hours as needed for up to 15 days (pain). 08/10/21 08/25/21 Yes Vevelyn Francois, NP  Prenatal Vit-Fe Fumarate-FA (PRENATAL VITAMIN PLUS LOW IRON) 27-1 MG TABS TAKE 1 TABLET BY MOUTH EVERY DAY Patient taking differently: Take 1 tablet by mouth daily. 03/03/20  Yes Shelly Bombard, MD  Vitamin D, Ergocalciferol, (DRISDOL) 1.25 MG (50000 UNIT) CAPS capsule TAKE ONE CAPSULE BY MOUTH ONE TIME PER WEEK Patient taking differently: Take 50,000 Units by mouth every Wednesday. 09/03/20  Yes Vevelyn Francois, NP     Allergies:   Allergies  Allergen Reactions   Ketamine Hives and Other (See Comments)    Makes feel funny     Physical Exam:   Vitals:   Vitals:   08/21/21 1300 08/21/21 1315  BP: (!) 118/53 (!) 110/34  Pulse: 79 74  Resp: 18 19  Temp:    SpO2: 94% 98%    Physical Exam: Constitutional: Patient appears well-developed and well-nourished. Not in obvious distress. HENT: Normocephalic, atraumatic, External right and left ear normal. Oropharynx is clear and moist.  Eyes: Conjunctivae and EOM are normal. PERRLA, no scleral icterus. Neck: Normal ROM. Neck supple. No JVD. No tracheal deviation. No thyromegaly. CVS: RRR, S1/S2 +, no murmurs, no gallops, no carotid bruit.  Pulmonary: Effort and breath sounds normal, no stridor, rhonchi, wheezes, rales.  Abdominal: Soft. BS +, no distension, tenderness, rebound or guarding.  Musculoskeletal: Normal range of motion. No edema and no tenderness.   Lymphadenopathy: No lymphadenopathy noted, cervical, inguinal or axillary Neuro: Alert. Normal reflexes, muscle tone coordination. No cranial nerve deficit. Skin: Skin is warm and dry. No rash noted. Not diaphoretic. No erythema. No pallor. Psychiatric: Normal mood and affect. Behavior, judgment, thought content normal.   Data Review:   CBC Recent Labs  Lab 08/21/21 1208  WBC 9.7  HGB 10.3*  HCT 29.1*  PLT 522*  MCV 82.2  MCH 29.1  MCHC 35.4  RDW 14.6  LYMPHSABS 3.8  MONOABS 1.0  EOSABS 0.2  BASOSABS 0.1   ------------------------------------------------------------------------------------------------------------------  Chemistries  Recent Labs  Lab 08/21/21 1208  NA 138  K 3.7  CL 112*  CO2 17*  GLUCOSE 83  BUN 14  CREATININE 0.44  CALCIUM 9.2  AST 27  ALT 31  ALKPHOS 53  BILITOT 1.3*   ------------------------------------------------------------------------------------------------------------------ CrCl cannot be calculated (Unknown ideal weight.). ------------------------------------------------------------------------------------------------------------------ No results for input(s): TSH, T4TOTAL, T3FREE, THYROIDAB in the last 72 hours.  Invalid input(s): FREET3  Coagulation profile No results for input(s): INR, PROTIME in the last 168 hours. ------------------------------------------------------------------------------------------------------------------- No results for input(s): DDIMER in the last 72 hours. -------------------------------------------------------------------------------------------------------------------  Cardiac Enzymes No results for input(s): CKMB, TROPONINI, MYOGLOBIN in the last 168 hours.  Invalid input(s): CK ------------------------------------------------------------------------------------------------------------------ No results found for:  BNP  ---------------------------------------------------------------------------------------------------------------  Urinalysis    Component Value Date/Time   COLORURINE YELLOW 06/19/2021 1707   APPEARANCEUR HAZY (A) 06/19/2021 1707   LABSPEC 1.012 06/19/2021 1707   PHURINE 5.0 06/19/2021 1707   GLUCOSEU NEGATIVE 06/19/2021 1707   HGBUR NEGATIVE 06/19/2021 1707   BILIRUBINUR NEGATIVE 06/19/2021 1707   BILIRUBINUR Negative 08/10/2019 [place] 06/19/2021 1707   PROTEINUR NEGATIVE 06/19/2021 1707   UROBILINOGEN 1.0 08/10/2019 1534   UROBILINOGEN 1.0 01/12/2018 0943   NITRITE NEGATIVE 06/19/2021 1707   LEUKOCYTESUR NEGATIVE 06/19/2021 1707    ----------------------------------------------------------------------------------------------------------------   Imaging Results:    No results found.   Assessment & Plan:  Principal Problem:   Sickle cell pain crisis (HCC) Active Problems:   Anemia of chronic disease   Moderate episode of recurrent major depressive disorder (HCC)   Chronic pain syndrome  Sickle cell disease with pain crisis Admit to MedSurg.  Initiate IV Dilaudid PCA with settings of 0.5 mg, 10-minute lockout, and 3 mg/h. Toradol 15 mg IV every 6 hours MS Contin 60 mg every 12 hours IV fluids, 0.45% saline at 100 mL/h Dilaudid 2 mg every 2 hours as needed while in the emergency department, patient is awaiting bed placement Monitor vital signs very closely, reevaluate pain scale regularly, and supplemental oxygen as needed  Anemia of chronic disease: Hemoglobin is stable and consistent with patient's baseline.  There is no clinical indication for blood transfusion at this time.  Continue to monitor closely. Folic acid 1 mg daily  Chronic pain syndrome: Continue MS Contin 60 mg every 12 hours. DVT Prophylaxis: Subcut Lovenox   AM Labs Ordered, also please review Full Orders  Family Communication: Admission, patient's condition and plan of  care including tests being ordered have been discussed with the patient who indicate understanding and agree with the plan and Code Status.  Code Status: Full Code  Consults called: None    Admission status: Inpatient    Time spent in minutes : 30 minutes  [name], MSN, FNP-C [facility] [address] [place], [place] [address] [phone]  08/21/2021 at 4:00 PM

## 2021-08-21 NOTE — ED Provider Notes (Signed)
[facility] DEPT Provider Note   CSN: [id number] Arrival date & time: 08/21/21  0844     History Chief Complaint  Patient presents with   Sickle Cell Pain Crisis    Carmen [name] is a 30 y.o. female with a past medical history significant for sickle cell disease, depression, and anxiety who presents to the ED due to suspected sickle cell pain crisis.  Patient endorses diffuse back pain that has progressively worsened over the past few days.  Typical triggers usually include change in weather.  No fever or chills.  Denies chest pain and shortness of breath.  Patient tried her morphine and oxycodone prior to arrival with no relief in symptoms.  Denies saddle paresthesias, bowel/bladder incontinence, lower extremity numbness/tingling, lower extremity weakness.  No history of IV drug use.  Patient states this is typical of her pain crises.  Pain is worse with palpation and movement.  History obtained from patient and past medical records. No interpreter used during encounter.       Past Medical History:  Diagnosis Date   Anxiety    Headache(784.0)    Heart murmur    Sickle cell crisis ([place])    Syphilis 2015   Was diagnosed and received one injection of antibiotics   Thrombocytosis 11/22/2014     CBC Latest Ref Rng & Units 12/13/2018 12/11/2018 12/10/2018 WBC 4.0 - 10.5 K/uL 14.8(H) 13.2(H) 15.9(H) Hemoglobin 12.0 - 15.0 g/dL 8.2(L) 7.7(L) 8.1(L) Hematocrit 36.0 - 46.0 % 23.7(L) 23.2(L) 24.5(L) Platelets 150 - 400 K/uL 326 399 388      Patient Active Problem List   Diagnosis Date Noted   Moderate episode of recurrent major depressive disorder ([place]) 05/28/2021   Depression 05/22/2021   Tobacco abuse 05/21/2021   Sickle cell disease with crisis ([place]) 03/23/2021   Acute pain of right shoulder    Sickle cell anemia with crisis ([place]) 02/16/2021   Acute chest syndrome ([place]) 01/18/2021   Community acquired pneumonia of right lower lobe of lung    Fever of  unknown origin 10/14/2020   Sickle cell crisis ([place]) 03/31/2020   Bacterial vaginitis 01/04/2020   Acute cystitis without hematuria    Dysuria 12/31/2019   Urine leukocytes    Sickle cell pain crisis ([place]) 09/10/2019   Sickle cell disease ([place]) 12/04/2018   Alpha thalassemia silent carrier 12/04/2018   Chronic prescription opiate use 03/13/2018   Chronic musculoskeletal pain 03/13/2018   Vitamin D deficiency 01/13/2018   Leukocytosis 08/21/2017   Hypokalemia 06/27/2017   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[place]) 04/05/2017   Hb-SS disease without crisis ([place]) 08/15/2016   Anemia of chronic disease    Chest pain 78/93/8101   Systolic ejection murmur 75/08/2584   Sickle cell anemia of mother during pregnancy ([place]) 05/16/2014    Past Surgical History:  Procedure Laterality Date   CESAREAN SECTION N/A 02/05/2019   Procedure: CESAREAN SECTION;  Surgeon: [name], MD;  Location: [facility] LD [facility]S;  Service: Obstetrics;  Laterality: N/A;   CHOLECYSTECTOMY N/A 11/30/2014   Procedure: LAPAROSCOPIC CHOLECYSTECTOMY SINGLE SITE WITH INTRAOPERATIVE CHOLANGIOGRAM;  Surgeon: Michael Boston, MD;  Location: [facility]S;  Service: General;  Laterality: N/A;   SPLENECTOMY       OB History     Gravida  2   Para  1   Term  1   Preterm      AB  1   Living  1      SAB  IAB  1   Ectopic      Multiple  0   Live Births  1           Family History  Problem Relation Age of Onset   Hypertension Mother    Sickle cell anemia Sister    Kidney disease Sister        Lupus   Arthritis Sister    Sickle cell anemia Sister    Sickle cell trait Sister    Heart disease Maternal Aunt        CABG   Heart disease Maternal Uncle        CABG   Lupus Sister     Social History   Tobacco Use   Smoking status: Some Days    Types: Cigarettes   Smokeless tobacco: Never  Vaping Use   Vaping Use: Never used  Substance Use Topics   Alcohol use: No   Drug use: No    Home  Medications Prior to Admission medications   Medication Sig Start Date End Date Taking? Authorizing Provider  Aspirin-Acetaminophen-Caffeine (EXCEDRIN PO) Take 1-2 tablets by mouth every 6 (six) hours as needed (pain).    [provider]  citalopram (CELEXA) 20 MG tablet TAKE 1 TABLET BY MOUTH EVERY DAY Patient taking differently: Take 20 mg by mouth daily. 02/24/21   [name], FNP  cyclobenzaprine (FLEXERIL) 10 MG tablet TAKE 1 TABLET BY MOUTH TWICE A DAY 07/27/21   Vevelyn Francois, NP  folic acid (FOLVITE) 1 MG tablet Take 1 tablet (1 mg total) by mouth daily. 03/15/19   Lanae Boast, FNP  morphine (MS CONTIN) 60 MG 12 hr tablet Take 1 tablet (60 mg total) by mouth every 12 (twelve) hours. 07/30/21 08/29/21  Tresa Garter, MD  Oxycodone HCl 10 MG TABS Take 1 tablet (10 mg total) by mouth every 6 (six) hours as needed for up to 15 days (pain). 08/10/21 08/25/21  Vevelyn Francois, NP  Prenatal Vit-Fe Fumarate-FA (PRENATAL VITAMIN PLUS LOW IRON) 27-1 MG TABS TAKE 1 TABLET BY MOUTH EVERY DAY Patient taking differently: Take 1 tablet by mouth daily. 03/03/20   Shelly Bombard, MD  Vitamin D, Ergocalciferol, (DRISDOL) 1.25 MG (50000 UNIT) CAPS capsule TAKE ONE CAPSULE BY MOUTH ONE TIME PER WEEK Patient taking differently: Take 50,000 Units by mouth every Wednesday. 09/03/20   Vevelyn Francois, NP    Allergies    Ketamine  Review of Systems   Review of Systems  Constitutional:  Negative for chills and fever.  Respiratory:  Negative for cough and shortness of breath.   Cardiovascular:  Negative for chest pain.  Musculoskeletal:  Positive for back pain.  Neurological:  Negative for weakness and numbness.  All other systems reviewed and are negative.  Physical Exam Updated Vital Signs BP 128/80   Pulse 88   Temp 98.5 F (36.9 C) (Oral)   Resp 12   SpO2 97%   Physical Exam Vitals and nursing note reviewed.  Constitutional:      General: She is not in acute  distress.    Appearance: She is not ill-appearing.  HENT:     Head: Normocephalic.  Eyes:     Pupils: Pupils are equal, round, and reactive to light.  Cardiovascular:     Rate and Rhythm: Normal rate and regular rhythm.     Pulses: Normal pulses.     Heart sounds: Normal heart sounds. No murmur heard.   No friction rub. No gallop.  Pulmonary:     Effort: Pulmonary effort is normal.     Breath sounds: Normal breath sounds.  Abdominal:     General: Abdomen is flat. There is no distension.     Palpations: Abdomen is soft.     Tenderness: There is no abdominal tenderness. There is no guarding or rebound.  Musculoskeletal:        General: Normal range of motion.     Cervical back: Neck supple.     Comments: Diffuse thoracic and lumbar paraspinal tenderness.  No thoracic or lumbar midline tenderness.  Bilateral lower extremities neurovascularly intact.  Skin:    General: Skin is warm and dry.  Neurological:     General: No focal deficit present.     Mental Status: She is alert.  Psychiatric:        Mood and Affect: Mood normal.        Behavior: Behavior normal.    ED Results / Procedures / Treatments   Labs (all labs ordered are listed, but only abnormal results are displayed) Labs Reviewed  COMPREHENSIVE METABOLIC PANEL - Abnormal; Notable for the following components:      Result Value   Chloride 112 (*)    CO2 17 (*)    Total Protein 8.2 (*)    Total Bilirubin 1.3 (*)    All other components within normal limits  CBC WITH DIFFERENTIAL/PLATELET - Abnormal; Notable for the following components:   RBC 3.54 (*)    Hemoglobin 10.3 (*)    HCT 29.1 (*)    Platelets 522 (*)    nRBC 0.4 (*)    All other components within normal limits  RETICULOCYTES - Abnormal; Notable for the following components:   Retic Ct Pct 5.0 (*)    RBC. 3.55 (*)    Immature Retic Fract 33.8 (*)    All other components within normal limits  RESP PANEL BY RT-PCR (FLU A&B, COVID) ARPGX2  I-STAT BETA  HCG BLOOD, ED ([facility], [facility], [facility] ONLY)    EKG None  Radiology No results found.  Procedures Procedures   Medications Ordered in ED Medications  HYDROmorphone (DILAUDID) injection 2 mg (2 mg Intravenous Given 08/21/21 1259)  HYDROmorphone (DILAUDID) injection 2 mg (2 mg Intravenous Given 08/21/21 1210)  diphenhydrAMINE (BENADRYL) injection 25 mg (25 mg Intravenous Given 08/21/21 1210)    ED Course  I have reviewed the triage vital signs and the nursing notes.  Pertinent labs & imaging results that were available during my care of the patient were reviewed by me and considered in my medical decision making (see chart for details).    MDM Rules/Calculators/A&P                          30 year old female presents to the ED due to sickle cell pain crisis.  Patient notes this is typical for her pain crises. No chest pain, shortness of breath, fever, or cough.  Upon arrival, vitals all within normal limits.  Patient in no acute distress.  Physical exam significant for diffuse tenderness throughout thoracic and lumbar paraspinal regions.  No midline tenderness.  Bilateral lower extremities neurovascularly intact with soft compartments.  Low suspicion for cauda equina or central cord compression.  Sickle cell labs ordered.  Dilaudid and Benadryl given.  CBC reassuring.  No leukocytosis.  Mild anemia with hemoglobin at 10.3 which appears to be around patient's baseline.  CMP reassuring.  Normal renal function.  Reticulocytes at baseline.  Pregnancy  test negative.  Upon reassessment after 2 doses of Dilaudid, patient still admits to severe pain.  Patient will need admission for sickle cell pain crisis. Low suspicion for acute chest syndrome or PE given no chest pain or shortness of breath.   Spoke with [place] Hollis with sickle cell clinic who agrees to admit patient for further treatment. COVID test ordered.  Final Clinical Impression(s) / ED Diagnoses Final diagnoses:  Sickle cell pain crisis [facility])     Rx / DC Orders ED Discharge Orders     None        Karie Kirks 08/21/21 1327    Luna Fuse, MD 08/21/21 1347

## 2021-08-21 NOTE — Progress Notes (Signed)
Patient has been in the bathroom since about 2000. Patient stated that she is constipated. AC had opened the bathroom and assessed the situation. Patient will not respond to doing vitals at this time and prior to this shift. This nurse has checked on this patient several times and informed patient to let this nurse know when ready for medication. Patient has not responded and has not used call bell to inform when she is ready to take medications. Will continue to monitor the situation. Again, patient will not respond to obtaining vital signs or taking medication at this time.

## 2021-08-22 ENCOUNTER — Other Ambulatory Visit: Payer: Self-pay

## 2021-08-22 LAB — CBC
HCT: 22.5 % — ABNORMAL LOW (ref 36.0–46.0)
Hemoglobin: 8.2 g/dL — ABNORMAL LOW (ref 12.0–15.0)
MCH: 29.6 pg (ref 26.0–34.0)
MCHC: 36.4 g/dL — ABNORMAL HIGH (ref 30.0–36.0)
MCV: 81.2 fL (ref 80.0–100.0)
Platelets: 477 10*3/uL — ABNORMAL HIGH (ref 150–400)
RBC: 2.77 MIL/uL — ABNORMAL LOW (ref 3.87–5.11)
RDW: 14.9 % (ref 11.5–15.5)
WBC: 13 10*3/uL — ABNORMAL HIGH (ref 4.0–10.5)
nRBC: 0.2 % (ref 0.0–0.2)

## 2021-08-22 MED ORDER — POLYETHYLENE GLYCOL 3350 17 G PO PACK
17.0000 g | PACK | Freq: Two times a day (BID) | ORAL | Status: DC
  Administered 2021-08-22 – 2021-08-24 (×4): 17 g via ORAL
  Filled 2021-08-22 (×4): qty 1

## 2021-08-22 MED ORDER — OXYCODONE HCL 10 MG PO TABS: 10.0000 mg | ORAL_TABLET | Freq: Four times a day (QID) | ORAL | 0 refills | Status: DC | PRN

## 2021-08-22 MED ORDER — HYDROMORPHONE 1 MG/ML IV SOLN
INTRAVENOUS | Status: DC
  Administered 2021-08-22: 2.4 mg via INTRAVENOUS
  Administered 2021-08-22: 30 mg via INTRAVENOUS
  Administered 2021-08-22: 8.5 mg via INTRAVENOUS
  Administered 2021-08-22: 5.6 mg via INTRAVENOUS
  Administered 2021-08-23: 6.6 mg via INTRAVENOUS
  Administered 2021-08-23: 4.8 mg via INTRAVENOUS
  Administered 2021-08-23: 30 mg via INTRAVENOUS
  Administered 2021-08-23: 8.4 mg via INTRAVENOUS
  Administered 2021-08-23: 9 mg via INTRAVENOUS
  Administered 2021-08-23: 4.8 mg via INTRAVENOUS
  Administered 2021-08-23: 6.6 mg via INTRAVENOUS
  Administered 2021-08-24: 4.2 mg via INTRAVENOUS
  Administered 2021-08-24: 1.8 mg via INTRAVENOUS
  Administered 2021-08-24: 7.2 mg via INTRAVENOUS
  Administered 2021-08-24: 30 mg via INTRAVENOUS
  Filled 2021-08-22 (×3): qty 30

## 2021-08-22 MED ORDER — OXYCODONE HCL 5 MG PO TABS
10.0000 mg | ORAL_TABLET | Freq: Four times a day (QID) | ORAL | Status: DC | PRN
  Administered 2021-08-22 – 2021-08-23 (×4): 10 mg via ORAL
  Filled 2021-08-22 (×4): qty 2

## 2021-08-22 NOTE — Progress Notes (Signed)
Subjective: A 30 year old female who was admitted with sickle cell painful crisis.  Patient has not been using her PCA as much as she has today.  Complaining of severe constipation.  She has also been crying.  On Dilaudid PCA.  Totradol.  Objective: Vital signs in last 24 hours: Temp:  [97.7 F (36.5 C)-98.3 F (36.8 C)] 97.7 F (36.5 C) (10/22 0533) Pulse Rate:  [66-88] 66 (10/22 0533) Resp:  [12-20] 15 (10/22 0833) BP: (106-138)/(34-91) 120/60 (10/22 0533) SpO2:  [94 %-100 %] 98 % (10/22 0833) FiO2 (%):  [0 %] 0 % (10/22 0444) Weight change:  Last BM Date: 08/21/21  Intake/Output from previous day: 10/21 0701 - 10/22 0700 In: 1210 [P.O.:480; I.V.:730] Out: -  Intake/Output this shift: No intake/output data recorded.  General appearance: alert, cooperative, appears stated age, and no distress Neck: no adenopathy, no carotid bruit, no JVD, supple, symmetrical, trachea midline, and thyroid not enlarged, symmetric, no tenderness/mass/nodules Back: symmetric, no curvature. ROM normal. No CVA tenderness. Resp: clear to auscultation bilaterally Cardio: regular rate and rhythm, S1, S2 normal, no murmur, click, rub or gallop GI: soft, non-tender; bowel sounds normal; no masses,  no organomegaly Extremities: extremities normal, atraumatic, no cyanosis or edema Pulses: 2+ and symmetric  Lab Results: Recent Labs    08/21/21 1208  WBC 9.7  HGB 10.3*  HCT 29.1*  PLT 522*   BMET Recent Labs    08/21/21 1208  NA 138  K 3.7  CL 112*  CO2 17*  GLUCOSE 83  BUN 14  CREATININE 0.44  CALCIUM 9.2    Studies/Results: No results found.  Medications: I have reviewed the patient's current medications.  Assessment/Plan: A 30 year old admitted with sickle cell painful crisis  #1 sickle cell painful crisis: Patient is still on IV Dilaudid with PCA and Toradol.  We will continue treatment and adjust dose as needed.  #2 anemia of chronic disease: H&H is stable.  Continue  treatment  #3 thrombocytosis: Continue to monitor platelets.  #4 severe constipation: Continue laxatives.  This includes MiraLAX and Senokot-S.  We will continue  #5 chronic pain syndrome: Continue long-acting home medications.   LOS: 1 day   Zannie Locastro,LAWAL 08/22/2021, 9:12 AM

## 2021-08-23 LAB — CBC WITH DIFFERENTIAL/PLATELET
Abs Immature Granulocytes: 0.07 10*3/uL (ref 0.00–0.07)
Basophils Absolute: 0.1 10*3/uL (ref 0.0–0.1)
Basophils Relative: 0 %
Eosinophils Absolute: 0.7 10*3/uL — ABNORMAL HIGH (ref 0.0–0.5)
Eosinophils Relative: 6 %
HCT: 20.3 % — ABNORMAL LOW (ref 36.0–46.0)
Hemoglobin: 7.5 g/dL — ABNORMAL LOW (ref 12.0–15.0)
Immature Granulocytes: 1 %
Lymphocytes Relative: 39 %
Lymphs Abs: 5 10*3/uL — ABNORMAL HIGH (ref 0.7–4.0)
MCH: 29.4 pg (ref 26.0–34.0)
MCHC: 36.9 g/dL — ABNORMAL HIGH (ref 30.0–36.0)
MCV: 79.6 fL — ABNORMAL LOW (ref 80.0–100.0)
Monocytes Absolute: 1.6 10*3/uL — ABNORMAL HIGH (ref 0.1–1.0)
Monocytes Relative: 13 %
Neutro Abs: 5.4 10*3/uL (ref 1.7–7.7)
Neutrophils Relative %: 41 %
Platelets: 388 10*3/uL (ref 150–400)
RBC: 2.55 MIL/uL — ABNORMAL LOW (ref 3.87–5.11)
RDW: 16.3 % — ABNORMAL HIGH (ref 11.5–15.5)
WBC: 12.8 10*3/uL — ABNORMAL HIGH (ref 4.0–10.5)
nRBC: 0.5 % — ABNORMAL HIGH (ref 0.0–0.2)

## 2021-08-23 LAB — COMPREHENSIVE METABOLIC PANEL
ALT: 29 U/L (ref 0–44)
AST: 35 U/L (ref 15–41)
Albumin: 4.4 g/dL (ref 3.5–5.0)
Alkaline Phosphatase: 68 U/L (ref 38–126)
Anion gap: 4 — ABNORMAL LOW (ref 5–15)
BUN: 20 mg/dL (ref 6–20)
CO2: 27 mmol/L (ref 22–32)
Calcium: 9.6 mg/dL (ref 8.9–10.3)
Chloride: 103 mmol/L (ref 98–111)
Creatinine, Ser: 0.48 mg/dL (ref 0.44–1.00)
GFR, Estimated: >60 mL/min (ref >60)
Glucose, Bld: 91 mg/dL (ref 70–99)
Potassium: 4.8 mmol/L (ref 3.5–5.1)
Sodium: 134 mmol/L — ABNORMAL LOW (ref 135–145)
Total Bilirubin: 1.7 mg/dL — ABNORMAL HIGH (ref 0.3–1.2)
Total Protein: 7.9 g/dL (ref 6.5–8.1)

## 2021-08-23 MED ORDER — NALOXEGOL OXALATE 12.5 MG PO TABS
12.5000 mg | ORAL_TABLET | Freq: Every day | ORAL | Status: DC
  Administered 2021-08-23 – 2021-08-24 (×2): 12.5 mg via ORAL
  Filled 2021-08-23 (×2): qty 1

## 2021-08-23 NOTE — Progress Notes (Signed)
Subjective: Carmen [name] 7 doing much better.  Still on Dilaudid PCA, Toradol and IV fluids.  Also on oral oxycodone.  Still having constipation despite being on laxatives.  Objective: Vital signs in last 24 hours: Temp:  [98 F (36.7 C)-98.3 F (36.8 C)] 98.1 F (36.7 C) (10/23 1101) Pulse Rate:  [72-85] 82 (10/23 1101) Resp:  [15-18] 16 (10/23 1101) BP: (104-125)/(64-75) 119/75 (10/23 1101) SpO2:  [95 %-99 %] 97 % (10/23 0835) Weight change:  Last BM Date: 08/22/21  Intake/Output from previous day: 10/22 0701 - 10/23 0700 In: 1061.6 [P.O.:470; I.V.:591.6] Out: -  Intake/Output this shift: Total I/O In: 940 [P.O.:940] Out: -   General appearance: alert, cooperative, appears stated age, and no distress Neck: no adenopathy, no carotid bruit, no JVD, supple, symmetrical, trachea midline, and thyroid not enlarged, symmetric, no tenderness/mass/nodules Back: symmetric, no curvature. ROM normal. No CVA tenderness. Resp: clear to auscultation bilaterally Cardio: regular rate and rhythm, S1, S2 normal, no murmur, click, rub or gallop GI: soft, non-tender; bowel sounds normal; no masses,  no organomegaly Extremities: extremities normal, atraumatic, no cyanosis or edema Pulses: 2+ and symmetric  Lab Results: Recent Labs    08/21/21 1208 08/22/21 0850  WBC 9.7 13.0*  HGB 10.3* 8.2*  HCT 29.1* 22.5*  PLT 522* 477*    BMET Recent Labs    08/21/21 1208  NA 138  K 3.7  CL 112*  CO2 17*  GLUCOSE 83  BUN 14  CREATININE 0.44  CALCIUM 9.2     Studies/Results: No results found.  Medications: I have reviewed the patient's current medications.  Assessment/Plan: A 30 year old admitted with sickle cell painful crisis  #1 sickle cell painful crisis: Patient is still on IV Dilaudid with PCA and Toradol.  We will continue to adjust the dose.  Continue supportive care.  Titrate off starting from today.  We will continue treatment and adjust dose as needed.  #2 anemia of chronic  disease: H&H is stable.  Continue treatment  #3 thrombocytosis: Continue to monitor platelets.  #4 severe constipation: Continue laxatives.  This includes MiraLAX and Senokot-S.  We will initiate Movantik.  Patient has failed other laxatives.  #5 chronic pain syndrome: Continue long-acting home medications.   LOS: 2 days   Esker Dever,LAWAL 08/23/2021, 11:19 AM

## 2021-08-24 ENCOUNTER — Other Ambulatory Visit: Payer: Self-pay

## 2021-08-24 DIAGNOSIS — D5700 Hb-SS disease with crisis, unspecified: Secondary | ICD-10-CM | POA: Diagnosis not present

## 2021-08-24 DIAGNOSIS — D571 Sickle-cell disease without crisis: Secondary | ICD-10-CM

## 2021-08-24 DIAGNOSIS — Z79891 Long term (current) use of opiate analgesic: Secondary | ICD-10-CM

## 2021-08-24 MED ORDER — TRAZODONE HCL 50 MG PO TABS
50.0000 mg | ORAL_TABLET | Freq: Once | ORAL | Status: AC
  Administered 2021-08-24: 50 mg via ORAL
  Filled 2021-08-24: qty 1

## 2021-08-24 NOTE — Discharge Summary (Signed)
Physician Discharge Summary  Carmen [name] GLO:[id number] DOB: 1991-07-13 DOA: 08/21/2021  PCP: Vevelyn Francois, NP  Admit date: 08/21/2021  Discharge date: 08/24/2021  Discharge Diagnoses:  Principal Problem:   Sickle cell pain crisis ([place]) Active Problems:   Anemia of chronic disease   Moderate episode of recurrent major depressive disorder (HCC)   Chronic pain syndrome   Discharge Condition: Stable  Disposition:  Pt is discharged home in good condition and is to follow up with Vevelyn Francois, NP this week to have labs evaluated. Carmen [name] is instructed to increase activity slowly and balance with rest for the next few days, and use prescribed medication to complete treatment of pain  Diet: Regular Wt Readings from Last 3 Encounters:  08/26/21 81.2 kg  07/24/21 83.9 kg  06/29/21 83.9 kg    History of present illness:  Carmen [name]  is a 30 y.o. female with a medical history significant for sickle cell disease, chronic pain syndrome, opiate dependence and tolerance, history of major depression, and history of anemia of chronic disease presents to the ER with complaints of allover body pain over the past 24 hours.  Patient states that she slept with her windows with intense generalized pain.  She characterizes pain as constant, aching, and occasionally sharp.  She rates pain as 9/10.  She says that she has been taking her home medication that include MS Contin 60 mg and oxycodone as prescribed without sustained relief.  Patient denies any headache, shortness of breath, or dizziness.,  Nausea, vomiting, or diarrhea.  No sick contacts, recent travel, or known exposure to COVID-19.  ER course: Vital signs recorded as: BP (!) 110/34   Pulse 74   Temp 98.5 F (36.9 C) (Oral)   Resp 19   SpO2 98%  Reviewed all laboratory values within normal limits.  Patient received 2 doses of Dilaudid 2 mg IV and oxycodone without any relief.  Admitted to [facility] for further management of  sickle cell pain crisis.  Hospital Course:  Sickle cell disease with pain crisis:  Patient was admitted for sickle cell pain crisis and managed appropriately with IVF, IV Dilaudid via PCA and IV Toradol, as well as other adjunct therapies per sickle cell pain management protocols.  IV Dilaudid PCA weaned appropriately, patient transition to her home medications.  She states the pain intensity is 4/10 and feels that she can manage at home on current medication. Patient's labs are stable and consistent with her baseline.  Patient was therefore discharged home today in a hemodynamically stable condition.   Carmen [name] will follow-up with PCP within 1 week of this discharge. Carmen [name] was counseled extensively about nonpharmacologic means of pain management, patient verbalized understanding and was appreciative of  the care received during this admission.   We discussed the need for good hydration, monitoring of hydration status, avoidance of heat, cold, stress, and infection triggers. We discussed the need to be adherent with taking home medications. Patient was reminded of the need to seek medical attention immediately if any symptom of bleeding, anemia, or infection occurs.  Discharge Exam: Vitals:   08/24/21 0958 08/24/21 1349  BP: 120/89 (!) 141/85  Pulse: 87 84  Resp: 16 17  Temp: 98.5 F (36.9 C) 98.3 F (36.8 C)  SpO2: 90%    Vitals:   08/24/21 0731 08/24/21 0958 08/24/21 1000 08/24/21 1349  BP:  120/89  (!) 141/85  Pulse:  87  84  Resp: 16 16  17   Temp:  98.5 F (36.9 C)  98.3 F (36.8 C)  TempSrc:  Oral  Oral  SpO2: (!) 0% 90%    Height:   5\' 3"  (1.6 m)     General appearance : Awake, alert, not in any distress. Speech Clear. Not toxic looking HEENT: Atraumatic and Normocephalic, pupils equally reactive to light and accomodation Neck: Supple, no JVD. No cervical lymphadenopathy.  Chest: Good air entry bilaterally, no added sounds  CVS: S1 S2 regular, no murmurs.  Abdomen: Bowel  sounds present, Non tender and not distended with no guarding, rigidity or rebound. Extremities: B/L Lower Ext shows no edema, both legs are warm to touch Neurology: Awake alert, and oriented X 3, CN II-XII intact, Non focal Skin: No Rash  Discharge Instructions  Discharge Instructions     Discharge patient   Complete by: As directed    Discharge disposition: 01-Home or Self Care   Discharge patient date: 08/24/2021      Allergies as of 08/24/2021       Reactions   Ketamine Hives, Other (See Comments)   Makes feel funny        Medication List     TAKE these medications    citalopram 20 MG tablet Commonly known as: CELEXA TAKE 1 TABLET BY MOUTH EVERY DAY   cyclobenzaprine 10 MG tablet Commonly known as: FLEXERIL TAKE 1 TABLET BY MOUTH TWICE A DAY What changed: when to take this   EXCEDRIN PO Take 1-2 tablets by mouth every 6 (six) hours as needed (pain).   folic acid 1 MG tablet Commonly known as: FOLVITE Take 1 tablet (1 mg total) by mouth daily.   Oxycodone HCl 10 MG Tabs Take 1 tablet (10 mg total) by mouth every 6 (six) hours as needed for up to 15 days (pain).   Prenatal Vitamin Plus Low Iron 27-1 MG Tabs TAKE 1 TABLET BY MOUTH EVERY DAY   Vitamin D (Ergocalciferol) 1.25 MG (50000 UNIT) Caps capsule Commonly known as: DRISDOL TAKE ONE CAPSULE BY MOUTH ONE TIME PER WEEK What changed:  how much to take how to take this when to take this additional instructions        The results of significant diagnostics from this hospitalization (including imaging, microbiology, ancillary and laboratory) are listed below for reference.    Significant Diagnostic Studies: No results found.  Microbiology: Recent Results (from the past 240 hour(s))  Resp Panel by RT-PCR (Flu A&B, Covid) Nasopharyngeal Swab     Status: None   Collection Time: 08/21/21  1:19 PM   Specimen: Nasopharyngeal Swab; Nasopharyngeal(NP) swabs in vial transport medium  Result Value Ref  Range Status   SARS Coronavirus 2 by RT PCR NEGATIVE NEGATIVE Final    Comment: (NOTE) SARS-CoV-2 target nucleic acids are NOT DETECTED.  The SARS-CoV-2 RNA is generally detectable in upper respiratory specimens during the acute phase of infection. The lowest concentration of SARS-CoV-2 viral copies this assay can detect is 138 copies/mL. A negative result does not preclude SARS-Cov-2 infection and should not be used as the sole basis for treatment or other patient management decisions. A negative result may occur with  improper specimen collection/handling, submission of specimen other than nasopharyngeal swab, presence of viral mutation(s) within the areas targeted by this assay, and inadequate number of viral copies(<138 copies/mL). A negative result must be combined with clinical observations, patient history, and epidemiological information. The expected result is Negative.  Fact Sheet for Patients:  EntrepreneurPulse.com.au  Fact Sheet for Healthcare Providers:  IncredibleEmployment.be  This test is no t yet approved or cleared by the [place] and  has been authorized for detection and/or diagnosis of SARS-CoV-2 by FDA under an Emergency Use Authorization (EUA). This EUA will remain  in effect (meaning this test can be used) for the duration of the COVID-19 declaration under Section 564(b)(1) of the Act, 21 U.S.C.section 360bbb-3(b)(1), unless the authorization is terminated  or revoked sooner.       Influenza A by PCR NEGATIVE NEGATIVE Final   Influenza B by PCR NEGATIVE NEGATIVE Final    Comment: (NOTE) The Xpert Xpress SARS-CoV-2/FLU/RSV plus assay is intended as an aid in the diagnosis of influenza from Nasopharyngeal swab specimens and should not be used as a sole basis for treatment. Nasal washings and aspirates are unacceptable for Xpert Xpress SARS-CoV-2/FLU/RSV testing.  Fact Sheet for  Patients: EntrepreneurPulse.com.au  Fact Sheet for Healthcare Providers: IncredibleEmployment.be  This test is not yet approved or cleared by the [place] FDA and has been authorized for detection and/or diagnosis of SARS-CoV-2 by FDA under an Emergency Use Authorization (EUA). This EUA will remain in effect (meaning this test can be used) for the duration of the COVID-19 declaration under Section 564(b)(1) of the Act, 21 U.S.C. section 360bbb-3(b)(1), unless the authorization is terminated or revoked.  Performed at [facility], [place] [address] [place], [place] [address]      Labs: Basic Metabolic Panel: Recent Labs  Lab 08/21/21 1208 08/23/21 1341  NA 138 134*  K 3.7 4.8  CL 112* 103  CO2 17* 27  GLUCOSE 83 91  BUN 14 20  CREATININE 0.44 0.48  CALCIUM 9.2 9.6   Liver Function Tests: Recent Labs  Lab 08/21/21 1208 08/23/21 1341  AST 27 35  ALT 31 29  ALKPHOS 53 68  BILITOT 1.3* 1.7*  PROT 8.2* 7.9  ALBUMIN 4.4 4.4   No results for input(s): LIPASE, AMYLASE in the last 168 hours. No results for input(s): AMMONIA in the last 168 hours. CBC: Recent Labs  Lab 08/21/21 1208 08/22/21 0850 08/23/21 1341  WBC 9.7 13.0* 12.8*  NEUTROABS 4.6  --  5.4  HGB 10.3* 8.2* 7.5*  HCT 29.1* 22.5* 20.3*  MCV 82.2 81.2 79.6*  PLT 522* 477* 388   Cardiac Enzymes: No results for input(s): CKTOTAL, CKMB, CKMBINDEX, TROPONINI in the last 168 hours. BNP: Invalid input(s): POCBNP CBG: No results for input(s): GLUCAP in the last 168 hours.  Time coordinating discharge: 30 minutes  Signed: [name]  APRN, MSN, FNP-C [facility] [address] [place], [place] [address] [phone]  [facility] 08/26/2021, 5:01 PM

## 2021-08-26 ENCOUNTER — Encounter: Payer: Self-pay | Admitting: Nurse Practitioner

## 2021-08-26 ENCOUNTER — Other Ambulatory Visit: Payer: Self-pay

## 2021-08-26 ENCOUNTER — Ambulatory Visit (INDEPENDENT_AMBULATORY_CARE_PROVIDER_SITE_OTHER): Payer: Medicaid Other | Admitting: Nurse Practitioner

## 2021-08-26 VITALS — BP 113/79 | HR 105 | Temp 98.1°F | Ht 63.0 in | Wt 179.1 lb

## 2021-08-26 DIAGNOSIS — Z79891 Long term (current) use of opiate analgesic: Secondary | ICD-10-CM | POA: Diagnosis not present

## 2021-08-26 DIAGNOSIS — D571 Sickle-cell disease without crisis: Secondary | ICD-10-CM

## 2021-08-26 DIAGNOSIS — Z23 Encounter for immunization: Secondary | ICD-10-CM

## 2021-08-26 DIAGNOSIS — Z3202 Encounter for pregnancy test, result negative: Secondary | ICD-10-CM

## 2021-08-26 DIAGNOSIS — Z30013 Encounter for initial prescription of injectable contraceptive: Secondary | ICD-10-CM

## 2021-08-26 DIAGNOSIS — Z30019 Encounter for initial prescription of contraceptives, unspecified: Secondary | ICD-10-CM

## 2021-08-26 LAB — POCT URINALYSIS DIP (CLINITEK)
Bilirubin, UA: NEGATIVE
Blood, UA: NEGATIVE
Glucose, UA: NEGATIVE mg/dL
Ketones, POC UA: NEGATIVE mg/dL
Leukocytes, UA: NEGATIVE
Nitrite, UA: NEGATIVE
POC PROTEIN,UA: NEGATIVE
Spec Grav, UA: 1.015 (ref 1.010–1.025)
Urobilinogen, UA: 1 E.U./dL
pH, UA: 7.5 (ref 5.0–8.0)

## 2021-08-26 LAB — POCT URINE PREGNANCY: Preg Test, Ur: NEGATIVE

## 2021-08-26 MED ORDER — MORPHINE SULFATE ER 60 MG PO TBCR: 60.0000 mg | EXTENDED_RELEASE_TABLET | Freq: Two times a day (BID) | ORAL | 0 refills | Status: DC

## 2021-08-26 MED ORDER — DULOXETINE HCL 30 MG PO CPEP: ORAL_CAPSULE | ORAL | 0 refills | Status: DC

## 2021-08-26 MED ORDER — MEDROXYPROGESTERONE ACETATE 150 MG/ML IM SUSP
150.0000 mg | Freq: Once | INTRAMUSCULAR | Status: AC
  Administered 2021-08-26: 150 mg via INTRAMUSCULAR

## 2021-08-26 NOTE — Progress Notes (Signed)
[facility] [place], [place]  [address] Phone:  [phone]   Fax:  [phone]   Established Patient Office Visit  Subjective:  Patient ID: Carmen [name], female    DOB: [birth year]  Age: 30 y.o. MRN: [id number]  CC:  Chief Complaint  Patient presents with   Follow-up    Requesting Depo shot, and refill morphine.  Discharged from hospital on 10/24, still having some pain.    HPI Carmen [name] presents for follow up. She  has a past medical history of Anxiety, Headache(784.0), Heart murmur, Sickle cell crisis ([place]), Syphilis (2015), and Thrombocytosis (11/22/2014).   She continues to have lower back and hip. She she was recently seen in the hospital for a sickle cell pain crisis. She was in the hospital for 4 days. She reports taking extra Morphine. She is relating this to the weather. She is requesting to have her morphine refilled early.  She does have access to her oxycodone. She has started a new job and reports that she has pain managed and able to maintain her job. Denies fever, headache, cough, wheezing, shortness of breath, chest pains, abdominal pain, Denies any open wounds, skin irritation.  Eye exam needs to be scheduled.     Past Medical History:  Diagnosis Date   Anxiety    Headache(784.0)    Heart murmur    Sickle cell crisis ([place])    Syphilis 2015   Was diagnosed and received one injection of antibiotics   Thrombocytosis 11/22/2014     CBC Latest Ref Rng & Units 12/13/2018 12/11/2018 12/10/2018 WBC 4.0 - 10.5 K/uL 14.8(H) 13.2(H) 15.9(H) Hemoglobin 12.0 - 15.0 g/dL 8.2(L) 7.7(L) 8.1(L) Hematocrit 36.0 - 46.0 % 23.7(L) 23.2(L) 24.5(L) Platelets 150 - 400 K/uL 326 399 388      Past Surgical History:  Procedure Laterality Date   CESAREAN SECTION N/A 02/05/2019   Procedure: CESAREAN SECTION;  Surgeon: [name], MD;  Location: [facility] LD ORS;  Service: Obstetrics;  Laterality: N/A;   CHOLECYSTECTOMY N/A 11/30/2014   Procedure:  LAPAROSCOPIC CHOLECYSTECTOMY SINGLE SITE WITH INTRAOPERATIVE CHOLANGIOGRAM;  Surgeon: Michael Boston, MD;  Location: [facility] ORS;  Service: General;  Laterality: N/A;   SPLENECTOMY      Family History  Problem Relation Age of Onset   Hypertension Mother    Sickle cell anemia Sister    Kidney disease Sister        Lupus   Arthritis Sister    Sickle cell anemia Sister    Sickle cell trait Sister    Heart disease Maternal Aunt        CABG   Heart disease Maternal Uncle        CABG   Lupus Sister     Social History   Socioeconomic History   Marital status: Single    Spouse name: Not on file   Number of children: Not on file   Years of education: Not on file   Highest education level: Not on file  Occupational History   Not on file  Tobacco Use   Smoking status: Some Days    Types: Cigarettes   Smokeless tobacco: Never  Vaping Use   Vaping Use: Never used  Substance and Sexual Activity   Alcohol use: No   Drug use: No   Sexual activity: Yes    Birth control/protection: Injection  Other Topics Concern   Not on file  Social History Narrative   Not on file  Social Determinants of Health   Financial Resource Strain: Not on file  Food Insecurity: Not on file  Transportation Needs: Not on file  Physical Activity: Not on file  Stress: Not on file  Social Connections: Not on file  Intimate Partner Violence: Not on file    Outpatient Medications Prior to Visit  Medication Sig Dispense Refill   Aspirin-Acetaminophen-Caffeine (EXCEDRIN PO) Take 1-2 tablets by mouth every 6 (six) hours as needed (pain).     citalopram (CELEXA) 20 MG tablet TAKE 1 TABLET BY MOUTH EVERY DAY (Patient taking differently: Take 20 mg by mouth daily.) 90 tablet 4   cyclobenzaprine (FLEXERIL) 10 MG tablet TAKE 1 TABLET BY MOUTH TWICE A DAY (Patient taking differently: Take 10 mg by mouth in the morning and at bedtime.) 60 tablet 2   folic acid (FOLVITE) 1 MG tablet Take 1 tablet (1 mg total) by mouth  daily. 90 tablet 3   Oxycodone HCl 10 MG TABS Take 1 tablet (10 mg total) by mouth every 6 (six) hours as needed for up to 15 days (pain). 60 tablet 0   Prenatal Vit-Fe Fumarate-FA (PRENATAL VITAMIN PLUS LOW IRON) 27-1 MG TABS TAKE 1 TABLET BY MOUTH EVERY DAY (Patient taking differently: Take 1 tablet by mouth daily.) 30 tablet 13   Vitamin D, Ergocalciferol, (DRISDOL) 1.25 MG (50000 UNIT) CAPS capsule TAKE ONE CAPSULE BY MOUTH ONE TIME PER WEEK (Patient taking differently: Take 50,000 Units by mouth every Wednesday.) 30 capsule 1   morphine (MS CONTIN) 60 MG 12 hr tablet Take 1 tablet (60 mg total) by mouth every 12 (twelve) hours. 60 tablet 0   Oxycodone HCl 10 MG TABS Take 1 tablet (10 mg total) by mouth every 6 (six) hours as needed (pain). 60 tablet 0   No facility-administered medications prior to visit.    Allergies  Allergen Reactions   Ketamine Hives and Other (See Comments)    Makes feel funny    ROS Review of Systems    Objective:    Physical Exam Constitutional:      General: She is not in acute distress.    Appearance: She is obese. She is not ill-appearing, toxic-appearing or diaphoretic.  HENT:     Head: Normocephalic and atraumatic.     Nose: Nose normal.     Mouth/Throat:     Mouth: Mucous membranes are moist.  Eyes:     General: Scleral icterus present.  Cardiovascular:     Rate and Rhythm: Normal rate and regular rhythm.     Pulses: Normal pulses.     Heart sounds: Normal heart sounds.  Pulmonary:     Effort: Pulmonary effort is normal.     Breath sounds: Normal breath sounds.  Abdominal:     Palpations: Abdomen is soft.  Musculoskeletal:        General: Normal range of motion.     Cervical back: Normal range of motion.     Right lower leg: No edema.     Left lower leg: No edema.  Skin:    General: Skin is warm and dry.     Capillary Refill: Capillary refill takes less than 2 seconds.  Neurological:     General: No focal deficit present.      Mental Status: She is alert and oriented to person, place, and time.  Psychiatric:        Mood and Affect: Mood normal.        Behavior: Behavior normal.  Thought Content: Thought content normal.        Judgment: Judgment normal.    BP 113/79 (BP Location: Right Arm, Patient Position: Sitting)   Pulse (!) 105   Temp 98.1 F (36.7 C)   Ht 5\' 3"  (1.6 m)   Wt 179 lb 0.8 oz (81.2 kg)   LMP 08/25/2021   SpO2 99%   BMI 31.72 kg/m  Wt Readings from Last 3 Encounters:  08/26/21 179 lb 0.8 oz (81.2 kg)  07/24/21 185 lb (83.9 kg)  06/29/21 185 lb (83.9 kg)     Health Maintenance Due  Topic Date Due   PAP SMEAR-Modifier  02/15/2021    There are no preventive care reminders to display for this patient.  No results found for: TSH Lab Results  Component Value Date   WBC 12.8 (H) 08/23/2021   HGB 7.5 (L) 08/23/2021   HCT 20.3 (L) 08/23/2021   MCV 79.6 (L) 08/23/2021   PLT 388 08/23/2021   Lab Results  Component Value Date   NA 134 (L) 08/23/2021   K 4.8 08/23/2021   CO2 27 08/23/2021   GLUCOSE 91 08/23/2021   BUN 20 08/23/2021   CREATININE 0.48 08/23/2021   BILITOT 1.7 (H) 08/23/2021   ALKPHOS 68 08/23/2021   AST 35 08/23/2021   ALT 29 08/23/2021   PROT 7.9 08/23/2021   ALBUMIN 4.4 08/23/2021   CALCIUM 9.6 08/23/2021   ANIONGAP 4 (L) 08/23/2021   No results found for: CHOL No results found for: HDL No results found for: LDLCALC No results found for: TRIG No results found for: CHOLHDL No results found for: HGBA1C    Assessment & Plan:   Problem List Items Addressed This Visit       Other   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 4-week follow-up Ensure adequate hydration. Move frequently to reduce venous thromboembolism risk. Avoid situations that could lead to dehydration or could exacerbate pain Discussed S&S of  infection, seizures, stroke acute chest, DVT and how important it is to seek medical attention Take medication as directed along with pain contract and overall compliance Discussed the risk related to opiate use (addition, tolerance and dependency)    Relevant Medications   DULoxetine (CYMBALTA) 30 MG capsule   morphine (MS CONTIN) 60 MG 12 hr tablet (Start on 08/29/2021)   Other Relevant Orders   POCT URINALYSIS DIP (CLINITEK) (Completed)   Chronic prescription opiate use   Relevant Medications   morphine (MS CONTIN) 60 MG 12 hr tablet (Start on 08/29/2021)   Other Visit Diagnoses     Encounter for female birth control       Relevant Medications   medroxyPROGESTERone (DEPO-PROVERA) injection 150 mg (Completed)   Other Relevant Orders   POCT urine pregnancy (Completed)   Flu vaccine need           Meds ordered this encounter  Medications   DULoxetine (CYMBALTA) 30 MG capsule    Sig: Take 1 capsule (30 mg total) by mouth daily for 3 days, THEN 2 capsules (60 mg total) daily.    Dispense:  63 capsule    Refill:  0    Order Specific Question:   Supervising Provider    Answer:   Tresa Garter [id number]   morphine (MS CONTIN) 60 MG 12 hr tablet    Sig: Take 1 tablet (60 mg total) by  mouth every 12 (twelve) hours.    Dispense:  60 tablet    Refill:  0    Order Specific Question:   Supervising Provider    Answer:   Tresa Garter [id number]   medroxyPROGESTERone (DEPO-PROVERA) injection 150 mg    Follow-up: Return in about 4 weeks (around 09/23/2021) for virtual visit Med mgmt [facility] .    Vevelyn Francois, NP

## 2021-08-26 NOTE — Patient Instructions (Signed)
Sickle Cell Anemia, Adult Sickle cell anemia is a condition where your red blood cells are shaped like sickles. Red blood cells carry oxygen through the body. Sickle-shaped cells do not live as long as normal red blood cells. They also clump together and block blood from flowing through the blood vessels. This prevents the body from getting enough oxygen. Sickle cell anemia causes organ damage and pain. It also increases the risk of infection. Follow these instructions at home: Medicines Take over-the-counter and prescription medicines only as told by your doctor. If you were prescribed an antibiotic medicine, take it as told by your doctor. Do not stop taking the antibiotic even if you start to feel better. If you develop a fever, do not take medicines to lower the fever right away. Tell your doctor about the fever. Managing pain, stiffness, and swelling Try these methods to help with pain: Use a heating pad. Take a warm bath. Distract yourself, such as by watching TV. Eating and drinking Drink enough fluid to keep your pee (urine) clear or pale yellow. Drink more in hot weather and during exercise. Limit or avoid alcohol. Eat a healthy diet. Eat plenty of fruits, vegetables, whole grains, and lean protein. Take vitamins and supplements as told by your doctor. Traveling When traveling, keep these with you: Your medical information. The names of your doctors. Your medicines. If you need to take an airplane, talk to your doctor first. Activity Rest often. Avoid exercises that make your heart beat much faster, such as jogging. General instructions Do not use products that have nicotine or tobacco, such as cigarettes and e-cigarettes. If you need help quitting, ask your doctor. Consider wearing a medical alert bracelet. Avoid being in high places (high altitudes), such as mountains. Avoid very hot or cold temperatures. Avoid places where the temperature changes a lot. Keep all follow-up  visits as told by your doctor. This is important. Contact a doctor if: A joint hurts. Your feet or hands hurt or swell. You feel tired (fatigued). Get help right away if: You have symptoms of infection. These include: Fever. Chills. Being very tired. Irritability. Poor eating. Throwing up (vomiting). You feel dizzy or faint. You have new stomach pain, especially on the left side. You have a an erection (priapism) that lasts more than 4 hours. You have numbness in your arms or legs. You have a hard time moving your arms or legs. You have trouble talking. You have pain that does not go away when you take medicine. You are short of breath. You are breathing fast. You have a long-term cough. You have pain in your chest. You have a bad headache. You have a stiff neck. Your stomach looks bloated even though you did not eat much. Your skin is pale. You suddenly cannot see well. Summary Sickle cell anemia is a condition where your red blood cells are shaped like sickles. Follow your doctor's advice on ways to manage pain, food to eat, activities to do, and steps to take for safe travel. Get medical help right away if you have any signs of infection, such as a fever. This information is not intended to replace advice given to you by your health care provider. Make sure you discuss any questions you have with your health care provider. Document Revised: 03/13/2020 Document Reviewed: 03/13/2020 Elsevier Patient Education  [place].

## 2021-08-28 ENCOUNTER — Telehealth: Payer: Self-pay

## 2021-08-28 NOTE — Telephone Encounter (Signed)
Transition Care Management Unsuccessful Follow-up Telephone Call  Date of discharge and from where:  08/24/2021  [facility]  Attempts:  1st Attempt  Reason for unsuccessful TCM follow-up call:  No answer/busy   Tomasa Rand, RN, BSN, CEN [place] Coordinator [phone]

## 2021-08-31 ENCOUNTER — Telehealth: Payer: Self-pay

## 2021-08-31 NOTE — Telephone Encounter (Signed)
Transition Care Management Unsuccessful Follow-up Telephone Call  Date of discharge and from where:  08/24/21 from [facility]  Attempts:  2nd Attempt  Reason for unsuccessful TCM follow-up call:  Left voice message  Thea Silversmith, RN, MSN, BSN, [place] Care Management Coordinator [phone]

## 2021-09-04 ENCOUNTER — Other Ambulatory Visit: Payer: Self-pay

## 2021-09-05 ENCOUNTER — Inpatient Hospital Stay (HOSPITAL_COMMUNITY)
Admission: EM | Admit: 2021-09-05 | Discharge: 2021-09-09 | DRG: 812 | Disposition: A | Discharge status: Home / Self Care | Payer: Medicaid Other | Attending: Internal Medicine | Admitting: Internal Medicine

## 2021-09-05 ENCOUNTER — Other Ambulatory Visit: Payer: Self-pay

## 2021-09-05 ENCOUNTER — Encounter (HOSPITAL_COMMUNITY): Payer: Self-pay

## 2021-09-05 ENCOUNTER — Emergency Department (HOSPITAL_COMMUNITY): Payer: Medicaid Other

## 2021-09-05 DIAGNOSIS — Z9081 Acquired absence of spleen: Secondary | ICD-10-CM

## 2021-09-05 DIAGNOSIS — D5700 Hb-SS disease with crisis, unspecified: Secondary | ICD-10-CM | POA: Diagnosis present

## 2021-09-05 DIAGNOSIS — F1120 Opioid dependence, uncomplicated: Secondary | ICD-10-CM | POA: Diagnosis present

## 2021-09-05 DIAGNOSIS — F4322 Adjustment disorder with anxiety: Secondary | ICD-10-CM | POA: Diagnosis present

## 2021-09-05 DIAGNOSIS — Z888 Allergy status to other drugs, medicaments and biological substances status: Secondary | ICD-10-CM

## 2021-09-05 DIAGNOSIS — D5701 Hb-SS disease with acute chest syndrome: Principal | ICD-10-CM | POA: Diagnosis present

## 2021-09-05 DIAGNOSIS — Z79899 Other long term (current) drug therapy: Secondary | ICD-10-CM | POA: Diagnosis not present

## 2021-09-05 DIAGNOSIS — G894 Chronic pain syndrome: Secondary | ICD-10-CM | POA: Diagnosis present

## 2021-09-05 DIAGNOSIS — R091 Pleurisy: Secondary | ICD-10-CM

## 2021-09-05 DIAGNOSIS — F329 Major depressive disorder, single episode, unspecified: Secondary | ICD-10-CM | POA: Diagnosis present

## 2021-09-05 DIAGNOSIS — Z20822 Contact with and (suspected) exposure to covid-19: Secondary | ICD-10-CM | POA: Diagnosis present

## 2021-09-05 DIAGNOSIS — Z832 Family history of diseases of the blood and blood-forming organs and certain disorders involving the immune mechanism: Secondary | ICD-10-CM

## 2021-09-05 LAB — CBC WITH DIFFERENTIAL/PLATELET
Abs Immature Granulocytes: 0.03 10*3/uL (ref 0.00–0.07)
Basophils Absolute: 0 10*3/uL (ref 0.0–0.1)
Basophils Relative: 0 %
Eosinophils Absolute: 0.2 10*3/uL (ref 0.0–0.5)
Eosinophils Relative: 2 %
HCT: 25.8 % — ABNORMAL LOW (ref 36.0–46.0)
Hemoglobin: 9.1 g/dL — ABNORMAL LOW (ref 12.0–15.0)
Immature Granulocytes: 0 %
Lymphocytes Relative: 41 %
Lymphs Abs: 5.1 10*3/uL — ABNORMAL HIGH (ref 0.7–4.0)
MCH: 29.5 pg (ref 26.0–34.0)
MCHC: 35.3 g/dL (ref 30.0–36.0)
MCV: 83.8 fL (ref 80.0–100.0)
Monocytes Absolute: 1.1 10*3/uL — ABNORMAL HIGH (ref 0.1–1.0)
Monocytes Relative: 9 %
Neutro Abs: 5.8 10*3/uL (ref 1.7–7.7)
Neutrophils Relative %: 48 %
Platelets: 557 10*3/uL — ABNORMAL HIGH (ref 150–400)
RBC: 3.08 MIL/uL — ABNORMAL LOW (ref 3.87–5.11)
RDW: 14.4 % (ref 11.5–15.5)
WBC: 12.2 10*3/uL — ABNORMAL HIGH (ref 4.0–10.5)
nRBC: 0.5 % — ABNORMAL HIGH (ref 0.0–0.2)

## 2021-09-05 LAB — COMPREHENSIVE METABOLIC PANEL
ALT: 20 U/L (ref 0–44)
AST: 18 U/L (ref 15–41)
Albumin: 4 g/dL (ref 3.5–5.0)
Alkaline Phosphatase: 65 U/L (ref 38–126)
Anion gap: 5 (ref 5–15)
BUN: 10 mg/dL (ref 6–20)
CO2: 25 mmol/L (ref 22–32)
Calcium: 9 mg/dL (ref 8.9–10.3)
Chloride: 109 mmol/L (ref 98–111)
Creatinine, Ser: 0.56 mg/dL (ref 0.44–1.00)
GFR, Estimated: >60 mL/min (ref >60)
Glucose, Bld: 94 mg/dL (ref 70–99)
Potassium: 3.9 mmol/L (ref 3.5–5.1)
Sodium: 139 mmol/L (ref 135–145)
Total Bilirubin: 0.8 mg/dL (ref 0.3–1.2)
Total Protein: 7.7 g/dL (ref 6.5–8.1)

## 2021-09-05 LAB — I-STAT BETA HCG BLOOD, ED (MC, WL, AP ONLY): I-stat hCG, quantitative: <5 m[IU]/mL (ref <5)

## 2021-09-05 LAB — RETICULOCYTES
Immature Retic Fract: 26.1 % — ABNORMAL HIGH (ref 2.3–15.9)
RBC.: 3.04 MIL/uL — ABNORMAL LOW (ref 3.87–5.11)
Retic Count, Absolute: 121.9 10*3/uL (ref 19.0–186.0)
Retic Ct Pct: 4 % — ABNORMAL HIGH (ref 0.4–3.1)

## 2021-09-05 LAB — RESP PANEL BY RT-PCR (FLU A&B, COVID) ARPGX2
Influenza A by PCR: NEGATIVE
Influenza B by PCR: NEGATIVE
SARS Coronavirus 2 by RT PCR: NEGATIVE

## 2021-09-05 LAB — LACTIC ACID, PLASMA: Lactic Acid, Venous: 1 mmol/L (ref 0.5–1.9)

## 2021-09-05 LAB — C-REACTIVE PROTEIN: CRP: 3.4 mg/dL — ABNORMAL HIGH (ref <1.0)

## 2021-09-05 LAB — D-DIMER, QUANTITATIVE: D-Dimer, Quant: 0.82 ug/mL-FEU — ABNORMAL HIGH (ref 0.00–0.50)

## 2021-09-05 MED ORDER — ONDANSETRON HCL 4 MG/2ML IJ SOLN: 4.0000 mg | Freq: Four times a day (QID) | INTRAMUSCULAR | Status: DC | PRN

## 2021-09-05 MED ORDER — HYDROMORPHONE HCL 2 MG/ML IJ SOLN
2.0000 mg | Freq: Once | INTRAMUSCULAR | Status: AC
  Administered 2021-09-05: 2 mg via INTRAVENOUS
  Filled 2021-09-05: qty 1

## 2021-09-05 MED ORDER — AZITHROMYCIN 250 MG PO TABS
500.0000 mg | ORAL_TABLET | Freq: Once | ORAL | Status: AC
  Administered 2021-09-05: 500 mg via ORAL
  Filled 2021-09-05: qty 2

## 2021-09-05 MED ORDER — SODIUM CHLORIDE 0.45 % IV SOLN: INTRAVENOUS | Status: DC

## 2021-09-05 MED ORDER — NALOXONE HCL 0.4 MG/ML IJ SOLN: 0.4000 mg | INTRAMUSCULAR | Status: DC | PRN

## 2021-09-05 MED ORDER — SODIUM CHLORIDE 0.9% FLUSH: 9.0000 mL | INTRAVENOUS | Status: DC | PRN

## 2021-09-05 MED ORDER — ONDANSETRON HCL 4 MG PO TABS
4.0000 mg | ORAL_TABLET | Freq: Four times a day (QID) | ORAL | Status: DC | PRN
  Administered 2021-09-08: 4 mg via ORAL
  Filled 2021-09-05 (×2): qty 1

## 2021-09-05 MED ORDER — HYDROMORPHONE 1 MG/ML IV SOLN: INTRAVENOUS | Status: DC

## 2021-09-05 MED ORDER — DIPHENHYDRAMINE HCL 25 MG PO CAPS
25.0000 mg | ORAL_CAPSULE | ORAL | Status: DC | PRN
  Administered 2021-09-05: 25 mg via ORAL
  Filled 2021-09-05: qty 1

## 2021-09-05 MED ORDER — IOHEXOL 350 MG/ML SOLN
80.0000 mL | Freq: Once | INTRAVENOUS | Status: AC | PRN
  Administered 2021-09-05: 80 mL via INTRAVENOUS

## 2021-09-05 MED ORDER — MELATONIN 5 MG PO TABS
10.0000 mg | ORAL_TABLET | Freq: Every evening | ORAL | Status: DC | PRN
  Administered 2021-09-06 – 2021-09-08 (×3): 10 mg via ORAL
  Filled 2021-09-05 (×3): qty 2

## 2021-09-05 MED ORDER — HYDROMORPHONE HCL 2 MG/ML IJ SOLN: 2.0000 mg | INTRAMUSCULAR | Status: DC | PRN

## 2021-09-05 MED ORDER — POLYETHYLENE GLYCOL 3350 17 G PO PACK: 17.0000 g | PACK | Freq: Every day | ORAL | Status: DC | PRN

## 2021-09-05 MED ORDER — KETOROLAC TROMETHAMINE 15 MG/ML IJ SOLN
15.0000 mg | Freq: Once | INTRAMUSCULAR | Status: AC
  Administered 2021-09-05: 15 mg via INTRAVENOUS
  Filled 2021-09-05: qty 1

## 2021-09-05 MED ORDER — ACETAMINOPHEN 325 MG PO TABS: 650.0000 mg | ORAL_TABLET | Freq: Four times a day (QID) | ORAL | Status: DC | PRN

## 2021-09-05 MED ORDER — SODIUM CHLORIDE 0.9 % IV SOLN: 25.0000 mg | INTRAVENOUS | Status: DC | PRN

## 2021-09-05 MED ORDER — SODIUM CHLORIDE 0.9 % IV BOLUS
500.0000 mL | Freq: Once | INTRAVENOUS | Status: AC
  Administered 2021-09-05: 500 mL via INTRAVENOUS

## 2021-09-05 MED ORDER — ACETAMINOPHEN 650 MG RE SUPP: 650.0000 mg | Freq: Four times a day (QID) | RECTAL | Status: DC | PRN

## 2021-09-05 MED ORDER — ENOXAPARIN SODIUM 40 MG/0.4ML IJ SOSY
40.0000 mg | PREFILLED_SYRINGE | INTRAMUSCULAR | Status: DC
  Filled 2021-09-05 (×3): qty 0.4

## 2021-09-05 MED ORDER — HYDROMORPHONE HCL 1 MG/ML IJ SOLN: 1.0000 mg | INTRAMUSCULAR | Status: DC | PRN

## 2021-09-05 MED ORDER — FOLIC ACID 1 MG PO TABS
1.0000 mg | ORAL_TABLET | Freq: Every day | ORAL | Status: DC
  Administered 2021-09-05 – 2021-09-09 (×5): 1 mg via ORAL
  Filled 2021-09-05 (×5): qty 1

## 2021-09-05 MED ORDER — HYDROMORPHONE HCL 2 MG/ML IJ SOLN
2.0000 mg | INTRAMUSCULAR | Status: AC
  Administered 2021-09-05: 2 mg via SUBCUTANEOUS
  Filled 2021-09-05: qty 1

## 2021-09-05 MED ORDER — MORPHINE SULFATE ER 15 MG PO TBCR
60.0000 mg | EXTENDED_RELEASE_TABLET | Freq: Two times a day (BID) | ORAL | Status: DC
  Administered 2021-09-05 – 2021-09-09 (×8): 60 mg via ORAL
  Filled 2021-09-05 (×8): qty 4

## 2021-09-05 MED ORDER — CYCLOBENZAPRINE HCL 10 MG PO TABS
10.0000 mg | ORAL_TABLET | Freq: Every day | ORAL | Status: DC | PRN
  Administered 2021-09-05 – 2021-09-08 (×3): 10 mg via ORAL
  Filled 2021-09-05 (×3): qty 1

## 2021-09-05 MED ORDER — SODIUM CHLORIDE 0.9 % IV SOLN
2.0000 g | Freq: Once | INTRAVENOUS | Status: AC
  Administered 2021-09-05: 2 g via INTRAVENOUS
  Filled 2021-09-05: qty 20

## 2021-09-05 MED ORDER — DIPHENHYDRAMINE HCL 25 MG PO CAPS: 25.0000 mg | ORAL_CAPSULE | Freq: Four times a day (QID) | ORAL | Status: DC | PRN

## 2021-09-05 MED ORDER — DIPHENHYDRAMINE HCL 25 MG PO CAPS: 25.0000 mg | ORAL_CAPSULE | ORAL | Status: DC | PRN

## 2021-09-05 MED ORDER — PRENATAL MULTIVITAMIN CH
1.0000 | ORAL_TABLET | Freq: Every day | ORAL | Status: DC
Start: 2021-09-06 — End: 2021-09-09
  Administered 2021-09-06 – 2021-09-09 (×4): 1 via ORAL
  Filled 2021-09-05 (×4): qty 1

## 2021-09-05 MED ORDER — HYDROMORPHONE HCL 2 MG/ML IJ SOLN
2.0000 mg | INTRAMUSCULAR | Status: DC | PRN
  Administered 2021-09-05 (×2): 2 mg via INTRAVENOUS
  Filled 2021-09-05 (×2): qty 1

## 2021-09-05 MED ORDER — DULOXETINE HCL 60 MG PO CPEP
60.0000 mg | ORAL_CAPSULE | Freq: Every day | ORAL | Status: DC
  Administered 2021-09-06 – 2021-09-09 (×4): 60 mg via ORAL
  Filled 2021-09-05 (×4): qty 1

## 2021-09-05 NOTE — H&P (Addendum)
History and Physical    Carmen [name] QVZ:[id number] DOB: Feb 10, 1991 DOA: 09/05/2021  PCP: Vevelyn Francois, NP  Patient coming from: Home   Chief Complaint:  Chief Complaint  Patient presents with   Sickle Cell Pain Crisis     HPI:    30 year old female with past medical history of sickle cell anemia, chronic pain syndrome, major depressive disorder presents to [facility] emergency department with complaints of chest pain and extremity pain.  Patient explains that 2 days ago she began to develop generalized body pain similar to previous episodes of acute sickle cell crises.  Patient states that her pain initially began in her bilateral upper and lower extremities, was sharp in quality, severe in intensity and worse with movement.  Patient attempted to take her home regimen of as needed opiates with no improvement in symptoms.  Over the following 2 days patient's symptoms continue to worsen and as her symptoms worsen she began to develop associated chest discomfort.  This chest discomfort began in her low anterior chest sharp in quality and worse with deep inspiration or movement.  Pain seems to radiate around her chest laterally to her thoracic spine .Patient complains of some associated shortness of breath that is mild in intensity.  Patient denies fevers, productive cough, sick contacts, recent travel or contact with known COVID-19 infection.  Patient symptoms continued to worsen just eventually presented to [facility] emergency department for evaluation.  Upon evaluation in the emergency department patient was noted to exhibit a mild leukocytosis of 12.2 with no other obvious SIRS criteria.  While patient was complaining of some chest discomfort patient exhibited no evidence of hypoxia typically saturating 100% on room air.  Due to chest discomfort, a D-dimer was performed and found to be somewhat elevated.  This was followed up with CT angiogram of the chest which  revealed no evidence of pulmonary embolism but did reveal evidence of small right-sided pleural effusion with peripheral nodular consolidations in the right lower lobe as well as a similar-appearing nodular consolidation in the left lower lobe thought to be an infectious or inflammatory process possibly due to acute chest syndrome.  Patient was initiated on intravenous volume resuscitation with isotonic fluids and initiated on intravenous azithromycin and ceftriaxone.  Hospitalist group was then called to assess the patient for admission to the hospital.  Review of Systems:   Review of Systems  Respiratory:  Positive for shortness of breath.   Cardiovascular:  Positive for chest pain.  Musculoskeletal:  Positive for back pain and joint pain.  All other systems reviewed and are negative.  Past Medical History:  Diagnosis Date   Anxiety    Headache(784.0)    Heart murmur    Sickle cell crisis ([place])    Syphilis 2015   Was diagnosed and received one injection of antibiotics   Thrombocytosis 11/22/2014     CBC Latest Ref Rng & Units 12/13/2018 12/11/2018 12/10/2018 WBC 4.0 - 10.5 K/uL 14.8(H) 13.2(H) 15.9(H) Hemoglobin 12.0 - 15.0 g/dL 8.2(L) 7.7(L) 8.1(L) Hematocrit 36.0 - 46.0 % 23.7(L) 23.2(L) 24.5(L) Platelets 150 - 400 K/uL 326 399 388      Past Surgical History:  Procedure Laterality Date   CESAREAN SECTION N/A 02/05/2019   Procedure: CESAREAN SECTION;  Surgeon: [name], MD;  Location: [facility] LD ORS;  Service: Obstetrics;  Laterality: N/A;   CHOLECYSTECTOMY N/A 11/30/2014   Procedure: LAPAROSCOPIC CHOLECYSTECTOMY SINGLE SITE WITH INTRAOPERATIVE CHOLANGIOGRAM;  Surgeon: Michael Boston, MD;  Location: [facility]  ORS;  Service: General;  Laterality: N/A;   SPLENECTOMY       reports that she has been smoking cigarettes. She has never used smokeless tobacco. She reports that she does not drink alcohol and does not use drugs.  Allergies  Allergen Reactions   Ketamine Hives and Other (See Comments)     Makes feel funny    Family History  Problem Relation Age of Onset   Hypertension Mother    Sickle cell anemia Sister    Kidney disease Sister        Lupus   Arthritis Sister    Sickle cell anemia Sister    Sickle cell trait Sister    Heart disease Maternal Aunt        CABG   Heart disease Maternal Uncle        CABG   Lupus Sister      Prior to Admission medications   Medication Sig Start Date End Date Taking? Authorizing Provider  citalopram (CELEXA) 20 MG tablet TAKE 1 TABLET BY MOUTH EVERY DAY Patient taking differently: Take 20 mg by mouth daily as needed (anxiety). 02/24/21  Yes [name], FNP  cyclobenzaprine (FLEXERIL) 10 MG tablet TAKE 1 TABLET BY MOUTH TWICE A DAY Patient taking differently: Take 10 mg by mouth daily as needed for muscle spasms. 07/27/21  Yes Vevelyn Francois, NP  DULoxetine (CYMBALTA) 30 MG capsule Take 1 capsule (30 mg total) by mouth daily for 3 days, THEN 2 capsules (60 mg total) daily. 08/26/21 09/28/21 Yes Vevelyn Francois, NP  folic acid (FOLVITE) 1 MG tablet Take 1 tablet (1 mg total) by mouth daily. 03/15/19  Yes Lanae Boast, FNP  morphine (MS CONTIN) 60 MG 12 hr tablet Take 1 tablet (60 mg total) by mouth every 12 (twelve) hours. 08/29/21 09/28/21 Yes Vevelyn Francois, NP  Oxycodone HCl 10 MG TABS Take 1 tablet (10 mg total) by mouth every 6 (six) hours as needed for up to 15 days (pain). 08/25/21 09/09/21 Yes Vevelyn Francois, NP  Prenatal Vit-Fe Fumarate-FA (PRENATAL VITAMIN PLUS LOW IRON) 27-1 MG TABS TAKE 1 TABLET BY MOUTH EVERY DAY Patient taking differently: Take 1 tablet by mouth daily. 03/03/20  Yes Shelly Bombard, MD  Vitamin D, Ergocalciferol, (DRISDOL) 1.25 MG (50000 UNIT) CAPS capsule TAKE ONE CAPSULE BY MOUTH ONE TIME PER WEEK Patient taking differently: Take 50,000 Units by mouth every Wednesday. 09/03/20  Yes Vevelyn Francois, NP    Physical Exam: Vitals:   09/05/21 1853 09/05/21 1930 09/05/21 1945 09/05/21 2027  BP:  114/73 115/74 117/89 124/82  Pulse: 77 78 91 (!) 109  Resp: 16 17 18 16   Temp:      TempSrc:      SpO2: 100% 100% 100% 98%  Weight:      Height:        Constitutional: Awake alert and oriented x3, patient is in distress due to pain Skin: no rashes, no lesions, good skin turgor noted. Eyes: Pupils are equally reactive to light.  No evidence of scleral icterus or conjunctival pallor.  ENMT: Moist mucous membranes noted.  Posterior pharynx clear of any exudate or lesions.   Neck: normal, supple, no masses, no thyromegaly.  No evidence of jugular venous distension.   Respiratory: Notable bibasilar rales.  No evidence of associated wheezing.  Increased respiratory effort without evidence of accessory muscle use.  Cardiovascular: Regular rate and rhythm, no murmurs / rubs / gallops. No extremity edema. 2+ pedal  pulses. No carotid bruits.  Chest:   anterior chest wall tenderness without crepitus or deformity.   Back:   midline thoracic tenderness without crepitus or deformity. Abdomen: Abdomen is soft and nontender.  No evidence of intra-abdominal masses.  Positive bowel sounds noted in all quadrants.   Musculoskeletal: Notable tenderness of the extremities without associated deformity.  Good ROM, no contractures. Normal muscle tone.  Neurologic: CN 2-12 grossly intact. Sensation intact.  Patient moving all 4 extremities spontaneously.  Patient is following all commands.  Patient is responsive to verbal stimuli.   Psychiatric: Patient exhibits normal mood with appropriate affect.  Patient seems to possess insight as to their current situation.     Labs on Admission: I have personally reviewed following labs and imaging studies -   CBC: Recent Labs  Lab 09/05/21 1737  WBC 12.2*  NEUTROABS 5.8  HGB 9.1*  HCT 25.8*  MCV 83.8  PLT 740*   Basic Metabolic Panel: Recent Labs  Lab 09/05/21 1737  NA 139  K 3.9  CL 109  CO2 25  GLUCOSE 94  BUN 10  CREATININE 0.56  CALCIUM 9.0    GFR: Estimated Creatinine Clearance: 105.9 mL/min (by C-G formula based on SCr of 0.56 mg/dL). Liver Function Tests: Recent Labs  Lab 09/05/21 1737  AST 18  ALT 20  ALKPHOS 65  BILITOT 0.8  PROT 7.7  ALBUMIN 4.0   No results for input(s): LIPASE, AMYLASE in the last 168 hours. No results for input(s): AMMONIA in the last 168 hours. Coagulation Profile: No results for input(s): INR, PROTIME in the last 168 hours. Cardiac Enzymes: No results for input(s): CKTOTAL, CKMB, CKMBINDEX, TROPONINI in the last 168 hours. BNP (last 3 results) No results for input(s): PROBNP in the last 8760 hours. HbA1C: No results for input(s): HGBA1C in the last 72 hours. CBG: No results for input(s): GLUCAP in the last 168 hours. Lipid Profile: No results for input(s): CHOL, HDL, LDLCALC, TRIG, CHOLHDL, LDLDIRECT in the last 72 hours. Thyroid Function Tests: No results for input(s): TSH, T4TOTAL, FREET4, T3FREE, THYROIDAB in the last 72 hours. Anemia Panel: Recent Labs    09/05/21 1737  RETICCTPCT 4.0*   Urine analysis:    Component Value Date/Time   COLORURINE YELLOW 06/19/2021 1707   APPEARANCEUR HAZY (A) 06/19/2021 1707   LABSPEC 1.012 06/19/2021 1707   PHURINE 5.0 06/19/2021 1707   GLUCOSEU NEGATIVE 06/19/2021 1707   HGBUR NEGATIVE 06/19/2021 1707   BILIRUBINUR negative 08/26/2021 1527   BILIRUBINUR Negative 08/10/2019 1534   KETONESUR negative 08/26/2021 [place] 06/19/2021 1707   PROTEINUR NEGATIVE 06/19/2021 1707   UROBILINOGEN 1.0 08/26/2021 1527   UROBILINOGEN 1.0 01/12/2018 0943   NITRITE Negative 08/26/2021 1527   NITRITE NEGATIVE 06/19/2021 1707   LEUKOCYTESUR Negative 08/26/2021 1527   LEUKOCYTESUR NEGATIVE 06/19/2021 1707    Radiological Exams on Admission - Personally Reviewed: CT Angio Chest PE W and/or Wo Contrast  Result Date: 09/05/2021 CLINICAL DATA:  Concern for sickle cell crisis, chest pain. EXAM: CT ANGIOGRAPHY CHEST WITH CONTRAST  TECHNIQUE: Multidetector CT imaging of the chest was performed using the standard protocol during bolus administration of intravenous contrast. Multiplanar CT image reconstructions and MIPs were obtained to evaluate the vascular anatomy. CONTRAST:  38mL OMNIPAQUE IOHEXOL 350 MG/ML SOLN COMPARISON:  CT February 10, 2019 FINDINGS: Cardiovascular: Satisfactory opacification of the pulmonary arteries to the segmental level. No evidence of pulmonary embolism. No thoracic aortic aneurysm or dissection. Normal heart size. No pericardial effusion.  Mediastinum/Nodes: No discrete thyroid nodule. No pathologically enlarged mediastinal, hilar or axillary lymph nodes. The trachea and esophagus are unremarkable. Lungs/Pleura: Tiny right-sided pleural effusion. Peripheral nodular consolidations in the right lower lobe with a similar appearing nodular consolidation in the peripheral left lower lobe. Upper Abdomen: No acute abnormality. Musculoskeletal: Osseous sequela of sickle cell disease. No acute osseous abnormality. Review of the MIP images confirms the above findings. IMPRESSION: 1. No evidence of pulmonary embolism. 2. Tiny right-sided pleural effusion with peripheral nodular consolidations in the right lower lobe as well as a similar appearing nodular consolidation in the peripheral left lower lobe. Findings are favored to reflect an infectious or inflammatory process as can be seen with acute chest syndrome of sickle cell disease. Electronically Signed   By: Dahlia Bailiff M.D.   On: 09/05/2021 19:16    EKG: Personally reviewed.  Rhythm is sinus bradycardia with heart rate of 58 bpm.  No dynamic ST segment changes appreciated.  Assessment/Plan  * Sickle cell crisis [facility]) Patient presenting with severe chest and extremity pain Pain is consistent with acute sickle cell crisis, presence of chest pain is concerning for concurrent acute chest syndrome Managing with intravenous volume resuscitation Providing patient with  high-dose intravenous opiate-based analgesics for substantial pain Will transition to PCA pump once patient arrives on medical floor Treating with intravenous antibiotics due to concerns for bilateral lower lobe pneumonia versus acute chest syndrome as detailed below  Acute chest syndrome due to sickle cell crisis [facility]) Patient complaining of shortness of breath and chest discomfort and states that this is a consistent presentation with her previous diagnosis of acute chest syndrome Patient was hospitalized for acute chest syndrome during hospitalization 12/2020 which was relatively mild and did not require blood or exchange transfusion This presentation seems similar -patient complaining of chest pain with bilateral lower lobe infiltrate on chest x-ray however patient is not hypoxic and is not in respiratory distress. Treating with intravenous ceftriaxone and azithromycin Patient is maintaining oxygen saturations greater than 95% this time, will provide supplemental oxygen as necessary to achieve this goal If patient exhibits any evidence of clinical deterioration will initiate blood transfusion and discuss possibility of exchange transfusion with hematology  Major depressive disorder Continue home regimen of psychotropic medications     Code Status:  Full code  code status decision has been confirmed with: patient Family Communication: deferred   Status is: Inpatient  Remains inpatient appropriate because: Acute sickle cell crisis complicated by concerns for acute chest syndrome requiring close clinical monitoring on telemetry, high-dose intravenous opiate-based analgesics, supplemental oxygen, intravenous volume resuscitation and possible need for expert consultation depending on clinical course.         Vernelle Emerald MD [facility] Pager [phone]  If 7PM-7AM, please contact night-coverage [email or web address] Use universal [place] password for that web site. If you  do not have the password, please call the hospital operator.  09/05/2021, 10:16 PM

## 2021-09-05 NOTE — ED Provider Notes (Signed)
[facility] DEPT Provider Note   CSN: [id number] Arrival date & time: 09/05/21  1216     History Chief Complaint  Patient presents with   Sickle Cell Pain Crisis    Carmen [name] is a 30 y.o. female.  HPI     30 year old female comes in with chief complaint of sickle cell pain. She reports that 2 days ago she started having pleuritic chest pain.  Pain is worse with inspiration and also with that she has some shortness of breath.  Today she started having generalized pain, more typical of her sickle cell pain.  Pain is located in bilateral upper and lower extremity.  She denies any associated cough, fevers, chills, nausea, vomiting.  No history of blood clots.  Patient has a Depo shot and does admit to smoking intermittently.  Epic also indicates that she has history of thrombocytosis.  Past Medical History:  Diagnosis Date   Anxiety    Headache(784.0)    Heart murmur    Sickle cell crisis ([place])    Syphilis 2015   Was diagnosed and received one injection of antibiotics   Thrombocytosis 11/22/2014     CBC Latest Ref Rng & Units 12/13/2018 12/11/2018 12/10/2018 WBC 4.0 - 10.5 K/uL 14.8(H) 13.2(H) 15.9(H) Hemoglobin 12.0 - 15.0 g/dL 8.2(L) 7.7(L) 8.1(L) Hematocrit 36.0 - 46.0 % 23.7(L) 23.2(L) 24.5(L) Platelets 150 - 400 K/uL 326 399 388      Patient Active Problem List   Diagnosis Date Noted   Acute chest syndrome due to sickle cell crisis ([place]) 09/05/2021   Chronic pain syndrome 08/21/2021   Moderate episode of recurrent major depressive disorder ([place]) 05/28/2021   Major depressive disorder 05/22/2021   Tobacco abuse 05/21/2021   Sickle cell disease with crisis ([place]) 03/23/2021   Acute pain of right shoulder    Sickle cell anemia with crisis ([place]) 02/16/2021   Community acquired pneumonia of right lower lobe of lung    Fever of unknown origin 10/14/2020   Sickle cell crisis ([place]) 03/31/2020   Bacterial vaginitis 01/04/2020   Acute cystitis  without hematuria    Dysuria 12/31/2019   Urine leukocytes    Sickle cell pain crisis ([place]) 09/10/2019   Sickle cell disease ([place]) 12/04/2018   Alpha thalassemia silent carrier 12/04/2018   Chronic prescription opiate use 03/13/2018   Chronic musculoskeletal pain 03/13/2018   Vitamin D deficiency 01/13/2018   Leukocytosis 08/21/2017   Hypokalemia 06/27/2017   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[place]) 04/05/2017   Hb-SS disease without crisis ([place]) 08/15/2016   Anemia of chronic disease    Chest pain 35/46/5681   Systolic ejection murmur 27/51/7001   Sickle cell anemia of mother during pregnancy ([place]) 05/16/2014    Past Surgical History:  Procedure Laterality Date   CESAREAN SECTION N/A 02/05/2019   Procedure: CESAREAN SECTION;  Surgeon: [name], MD;  Location: [facility] LD ORS;  Service: Obstetrics;  Laterality: N/A;   CHOLECYSTECTOMY N/A 11/30/2014   Procedure: LAPAROSCOPIC CHOLECYSTECTOMY SINGLE SITE WITH INTRAOPERATIVE CHOLANGIOGRAM;  Surgeon: Michael Boston, MD;  Location: [facility] ORS;  Service: General;  Laterality: N/A;   SPLENECTOMY       OB History     Gravida  2   Para  1   Term  1   Preterm      AB  1   Living  1      SAB      IAB  1   Ectopic  Multiple  0   Live Births  1           Family History  Problem Relation Age of Onset   Hypertension Mother    Sickle cell anemia Sister    Kidney disease Sister        Lupus   Arthritis Sister    Sickle cell anemia Sister    Sickle cell trait Sister    Heart disease Maternal Aunt        CABG   Heart disease Maternal Uncle        CABG   Lupus Sister     Social History   Tobacco Use   Smoking status: Some Days    Types: Cigarettes   Smokeless tobacco: Never  Vaping Use   Vaping Use: Never used  Substance Use Topics   Alcohol use: No   Drug use: No    Home Medications Prior to Admission medications   Medication Sig Start Date End Date Taking? Authorizing Provider  citalopram  (CELEXA) 20 MG tablet TAKE 1 TABLET BY MOUTH EVERY DAY Patient taking differently: Take 20 mg by mouth daily as needed (anxiety). 02/24/21  Yes [name], FNP  cyclobenzaprine (FLEXERIL) 10 MG tablet TAKE 1 TABLET BY MOUTH TWICE A DAY Patient taking differently: Take 10 mg by mouth daily as needed for muscle spasms. 07/27/21  Yes Vevelyn Francois, NP  DULoxetine (CYMBALTA) 30 MG capsule Take 1 capsule (30 mg total) by mouth daily for 3 days, THEN 2 capsules (60 mg total) daily. 08/26/21 09/28/21 Yes Vevelyn Francois, NP  folic acid (FOLVITE) 1 MG tablet Take 1 tablet (1 mg total) by mouth daily. 03/15/19  Yes Lanae Boast, FNP  morphine (MS CONTIN) 60 MG 12 hr tablet Take 1 tablet (60 mg total) by mouth every 12 (twelve) hours. 08/29/21 09/28/21 Yes Vevelyn Francois, NP  Oxycodone HCl 10 MG TABS Take 1 tablet (10 mg total) by mouth every 6 (six) hours as needed for up to 15 days (pain). 08/25/21 09/09/21 Yes Vevelyn Francois, NP  Prenatal Vit-Fe Fumarate-FA (PRENATAL VITAMIN PLUS LOW IRON) 27-1 MG TABS TAKE 1 TABLET BY MOUTH EVERY DAY Patient taking differently: Take 1 tablet by mouth daily. 03/03/20  Yes Shelly Bombard, MD  Vitamin D, Ergocalciferol, (DRISDOL) 1.25 MG (50000 UNIT) CAPS capsule TAKE ONE CAPSULE BY MOUTH ONE TIME PER WEEK Patient taking differently: Take 50,000 Units by mouth every Wednesday. 09/03/20  Yes Vevelyn Francois, NP    Allergies    Ketamine  Review of Systems   Review of Systems  Constitutional:  Positive for activity change.  Respiratory:  Positive for shortness of breath.   Cardiovascular:  Positive for chest pain.  Musculoskeletal:  Positive for arthralgias and myalgias.  All other systems reviewed and are negative.  Physical Exam Updated Vital Signs BP (!) 98/56 (BP Location: Right Arm)   Pulse 66   Temp (!) 97.5 F (36.4 C) (Oral)   Resp 17   Ht 5\' 3"  (1.6 m)   Wt 81.6 kg   LMP 08/25/2021   SpO2 100%   BMI 31.89 kg/m   Physical Exam Vitals and  nursing note reviewed.  Constitutional:      Appearance: She is well-developed.  HENT:     Head: Atraumatic.  Cardiovascular:     Rate and Rhythm: Normal rate.  Pulmonary:     Effort: Pulmonary effort is normal.  Musculoskeletal:     Cervical back: Normal range of  motion and neck supple.  Skin:    General: Skin is warm and dry.  Neurological:     Mental Status: She is alert and oriented to person, place, and time.    ED Results / Procedures / Treatments   Labs (all labs ordered are listed, but only abnormal results are displayed) Labs Reviewed  RETICULOCYTES - Abnormal; Notable for the following components:      Result Value   Retic Ct Pct 4.0 (*)    RBC. 3.04 (*)    Immature Retic Fract 26.1 (*)    All other components within normal limits  CBC WITH DIFFERENTIAL/PLATELET - Abnormal; Notable for the following components:   WBC 12.2 (*)    RBC 3.08 (*)    Hemoglobin 9.1 (*)    HCT 25.8 (*)    Platelets 557 (*)    nRBC 0.5 (*)    Lymphs Abs 5.1 (*)    Monocytes Absolute 1.1 (*)    All other components within normal limits  D-DIMER, QUANTITATIVE - Abnormal; Notable for the following components:   D-Dimer, Quant 0.82 (*)    All other components within normal limits  C-REACTIVE PROTEIN - Abnormal; Notable for the following components:   CRP 3.4 (*)    All other components within normal limits  CBC WITH DIFFERENTIAL/PLATELET - Abnormal; Notable for the following components:   WBC 11.4 (*)    RBC 2.82 (*)    Hemoglobin 8.1 (*)    HCT 23.3 (*)    Platelets 522 (*)    nRBC 0.4 (*)    Monocytes Absolute 1.3 (*)    All other components within normal limits  RETICULOCYTES - Abnormal; Notable for the following components:   Retic Ct Pct 3.8 (*)    RBC. 2.75 (*)    Immature Retic Fract 30.1 (*)    All other components within normal limits  COMPREHENSIVE METABOLIC PANEL - Abnormal; Notable for the following components:   Calcium 8.8 (*)    All other components within  normal limits  RESP PANEL BY RT-PCR (FLU A&B, COVID) ARPGX2  CULTURE, BLOOD (ROUTINE X 2)  CULTURE, BLOOD (ROUTINE X 2)  COMPREHENSIVE METABOLIC PANEL  PROCALCITONIN  LACTIC ACID, PLASMA  MAGNESIUM  BRAIN NATRIURETIC PEPTIDE  CBC WITH DIFFERENTIAL/PLATELET  I-STAT BETA HCG BLOOD, ED ([facility], [facility], [facility] ONLY)  TROPONIN I (HIGH SENSITIVITY)    EKG EKG Interpretation  Date/Time:  Saturday September 05 2021 20:44:25 EST Ventricular Rate:  58 PR Interval:  137 QRS Duration: 87 QT Interval:  398 QTC Calculation: 391 R Axis:   57 Text Interpretation: Sinus rhythm no sig change from previous Confirmed by Charlesetta Shanks [phone]) on 09/06/2021 1:14:36 PM  Radiology CT Angio Chest PE W and/or Wo Contrast  Result Date: 09/05/2021 CLINICAL DATA:  Concern for sickle cell crisis, chest pain. EXAM: CT ANGIOGRAPHY CHEST WITH CONTRAST TECHNIQUE: Multidetector CT imaging of the chest was performed using the standard protocol during bolus administration of intravenous contrast. Multiplanar CT image reconstructions and MIPs were obtained to evaluate the vascular anatomy. CONTRAST:  51mL OMNIPAQUE IOHEXOL 350 MG/ML SOLN COMPARISON:  CT February 10, 2019 FINDINGS: Cardiovascular: Satisfactory opacification of the pulmonary arteries to the segmental level. No evidence of pulmonary embolism. No thoracic aortic aneurysm or dissection. Normal heart size. No pericardial effusion. Mediastinum/Nodes: No discrete thyroid nodule. No pathologically enlarged mediastinal, hilar or axillary lymph nodes. The trachea and esophagus are unremarkable. Lungs/Pleura: Tiny right-sided pleural effusion. Peripheral nodular consolidations in the right lower lobe with  a similar appearing nodular consolidation in the peripheral left lower lobe. Upper Abdomen: No acute abnormality. Musculoskeletal: Osseous sequela of sickle cell disease. No acute osseous abnormality. Review of the MIP images confirms the above findings. IMPRESSION: 1. No evidence of  pulmonary embolism. 2. Tiny right-sided pleural effusion with peripheral nodular consolidations in the right lower lobe as well as a similar appearing nodular consolidation in the peripheral left lower lobe. Findings are favored to reflect an infectious or inflammatory process as can be seen with acute chest syndrome of sickle cell disease. Electronically Signed   By: Dahlia Bailiff M.D.   On: 09/05/2021 19:16    Procedures Angiocath insertion  Date/Time: 09/05/2021 6:11 PM Performed by: Varney Biles, MD Authorized by: Varney Biles, MD  Consent: Verbal consent obtained. Risks and benefits: risks, benefits and alternatives were discussed Consent given by: patient Patient understanding: patient states understanding of the procedure being performed Patient identity confirmed: arm band Time out: Immediately prior to procedure a "time out" was called to verify the correct patient, procedure, equipment, support staff and site/side marked as required. Preparation: Patient was prepped and draped in the usual sterile fashion. Local anesthesia used: no  Anesthesia: Local anesthesia used: no  Sedation: Patient sedated: no  Patient tolerance: patient tolerated the procedure well with no immediate complications Comments: 77-OEUMP IV, proximal to the right AC   .Critical Care E&M Performed by: Varney Biles, MD  Critical care provider statement:    Critical care time (minutes):  48   Critical care time was exclusive of:  Separately billable procedures and treating other patients   Critical care was time spent personally by me on the following activities:  Blood draw for specimens, development of treatment plan with patient or surrogate, discussions with consultants, discussions with primary provider, evaluation of patient's response to treatment, examination of patient, interpretation of cardiac output measurements, ordering and performing treatments and interventions, ordering and review  of laboratory studies, ordering and review of radiographic studies, pulse oximetry, re-evaluation of patient's condition, review of old charts and obtaining history from patient or surrogate After initial E/M assessment, critical care services were subsequently performed that were exclusive of separately billable procedures or treatment.     Medications Ordered in ED Medications  morphine (MS CONTIN) 12 hr tablet 60 mg (60 mg Oral Given 09/06/21 1137)  DULoxetine (CYMBALTA) DR capsule 60 mg (60 mg Oral Given 53/6/14 4315)  folic acid (FOLVITE) tablet 1 mg (1 mg Oral Given 09/06/21 1137)  cyclobenzaprine (FLEXERIL) tablet 10 mg (10 mg Oral Given 09/05/21 2302)  prenatal multivitamin tablet 1 tablet (1 tablet Oral Given 09/06/21 1137)  enoxaparin (LOVENOX) injection 40 mg (40 mg Subcutaneous Not Given 09/05/21 2254)  acetaminophen (TYLENOL) tablet 650 mg (has no administration in time range)    Or  acetaminophen (TYLENOL) suppository 650 mg (has no administration in time range)  polyethylene glycol (MIRALAX / GLYCOLAX) packet 17 g (has no administration in time range)  ondansetron (ZOFRAN) tablet 4 mg (has no administration in time range)    Or  ondansetron (ZOFRAN) injection 4 mg (has no administration in time range)  0.45 % sodium chloride infusion ( Intravenous New Bag/Given 09/05/21 2303)  diphenhydrAMINE (BENADRYL) capsule 25 mg (has no administration in time range)  melatonin tablet 10 mg (10 mg Oral Given 09/06/21 0031)  HYDROmorphone (DILAUDID) 1 mg/mL PCA injection (3 mg Intravenous Received 09/06/21 1131)  cefTRIAXone (ROCEPHIN) 2 g in sodium chloride 0.9 % 100 mL IVPB (has no administration in time  range)  sodium chloride flush (NS) 0.9 % injection 10-40 mL (has no administration in time range)  sodium chloride flush (NS) 0.9 % injection 10-40 mL (has no administration in time range)  azithromycin (ZITHROMAX) tablet 500 mg (has no administration in time range)  HYDROmorphone (DILAUDID)  injection 2 mg (2 mg Subcutaneous Given 09/05/21 1413)  sodium chloride 0.9 % bolus 500 mL (0 mLs Intravenous Stopped 09/05/21 2009)  ketorolac (TORADOL) 15 MG/ML injection 15 mg (15 mg Intravenous Given 09/05/21 1844)  iohexol (OMNIPAQUE) 350 MG/ML injection 80 mL (80 mLs Intravenous Contrast Given 09/05/21 1828)  cefTRIAXone (ROCEPHIN) 2 g in sodium chloride 0.9 % 100 mL IVPB (0 g Intravenous Stopped 09/05/21 2043)  azithromycin (ZITHROMAX) tablet 500 mg (500 mg Oral Given 09/05/21 2038)  HYDROmorphone (DILAUDID) injection 2 mg (2 mg Intravenous Given 09/05/21 2057)  HYDROmorphone (DILAUDID) injection 1 mg (1 mg Intravenous Given 09/06/21 0017)    ED Course  I have reviewed the triage vital signs and the nursing notes.  Pertinent labs & imaging results that were available during my care of the patient were reviewed by me and considered in my medical decision making (see chart for details).  Clinical Course as of 09/06/21 1433  Sat Sep 05, 2021  2018 CT-PE is neg for PE.  Patient has slightly elevated white count.  CT scan is showing small right and left-sided consolidation with possibility of acute chest.    In the setting of patient having consolidation with chest pain, we will proceed with the presumptive diagnosis of ACS, and start treatment for it.  Sickle cell team can de-escalate if needed.  [AN]    Clinical Course User Index [AN] Varney Biles, MD   MDM Rules/Calculators/A&P                           30 year old comes in with chief complaint of generalized body pain typical of her sickle cell.  She also has chest pain that started 2 days ago and is not typical of her sickle cell pain.  With this she does not have any cough, fevers and her lung exam is clear.  No URI-like symptoms.  Patient has taken her morphine at home without any relief.  Patient's O2 sats at arrival was noted at 93%.  She is having pleuritic chest pain with history of thrombocytosis and sickle cell  anemia.  D-dimer was ordered and it is slightly elevated, so we will proceed with CT PE.  Final Clinical Impression(s) / ED Diagnoses Final diagnoses:  Sickle cell pain crisis ([place])  Pleurisy  Acute chest syndrome [facility]    Rx / DC Orders ED Discharge Orders     None        Varney Biles, MD 09/06/21 1433

## 2021-09-05 NOTE — Assessment & Plan Note (Signed)
   Patient presenting with severe chest and extremity pain  Pain is consistent with acute sickle cell crisis, presence of chest pain is concerning for concurrent acute chest syndrome  Managing with intravenous volume resuscitation  Providing patient with high-dose intravenous opiate-based analgesics for substantial pain  Will transition to PCA pump once patient arrives on medical floor  Treating with intravenous antibiotics due to concerns for bilateral lower lobe pneumonia versus acute chest syndrome as detailed below

## 2021-09-05 NOTE — Assessment & Plan Note (Signed)
   Continue home regimen of psychotropic medications 

## 2021-09-05 NOTE — ED Triage Notes (Signed)
Pt. Arrived POV due to sickle cell crisis. Pain started 2 days ago in her whole body. She took Morphine at about 12 am for pain relief, but was not effective.

## 2021-09-05 NOTE — Assessment & Plan Note (Signed)
   Patient complaining of shortness of breath and chest discomfort and states that this is a consistent presentation with her previous diagnosis of acute chest syndrome  Patient was hospitalized for acute chest syndrome during hospitalization 12/2020 which was relatively mild and did not require blood or exchange transfusion  This presentation seems similar -patient complaining of chest pain with bilateral lower lobe infiltrate on chest x-ray however patient is not hypoxic and is not in respiratory distress.  Treating with intravenous ceftriaxone and azithromycin  Patient is maintaining oxygen saturations greater than 95% this time, will provide supplemental oxygen as necessary to achieve this goal  If patient exhibits any evidence of clinical deterioration will initiate blood transfusion and discuss possibility of exchange transfusion with hematology

## 2021-09-06 DIAGNOSIS — D5700 Hb-SS disease with crisis, unspecified: Secondary | ICD-10-CM | POA: Diagnosis not present

## 2021-09-06 LAB — CBC WITH DIFFERENTIAL/PLATELET
Abs Immature Granulocytes: 0.05 10*3/uL (ref 0.00–0.07)
Basophils Absolute: 0 10*3/uL (ref 0.0–0.1)
Basophils Relative: 0 %
Eosinophils Absolute: 0.3 10*3/uL (ref 0.0–0.5)
Eosinophils Relative: 2 %
HCT: 23.3 % — ABNORMAL LOW (ref 36.0–46.0)
Hemoglobin: 8.1 g/dL — ABNORMAL LOW (ref 12.0–15.0)
Immature Granulocytes: 0 %
Lymphocytes Relative: 28 %
Lymphs Abs: 3.2 10*3/uL (ref 0.7–4.0)
MCH: 28.7 pg (ref 26.0–34.0)
MCHC: 34.8 g/dL (ref 30.0–36.0)
MCV: 82.6 fL (ref 80.0–100.0)
Monocytes Absolute: 1.3 10*3/uL — ABNORMAL HIGH (ref 0.1–1.0)
Monocytes Relative: 12 %
Neutro Abs: 6.6 10*3/uL (ref 1.7–7.7)
Neutrophils Relative %: 58 %
Platelets: 522 10*3/uL — ABNORMAL HIGH (ref 150–400)
RBC: 2.82 MIL/uL — ABNORMAL LOW (ref 3.87–5.11)
RDW: 13.9 % (ref 11.5–15.5)
WBC: 11.4 10*3/uL — ABNORMAL HIGH (ref 4.0–10.5)
nRBC: 0.4 % — ABNORMAL HIGH (ref 0.0–0.2)

## 2021-09-06 LAB — COMPREHENSIVE METABOLIC PANEL
ALT: 17 U/L (ref 0–44)
AST: 17 U/L (ref 15–41)
Albumin: 3.6 g/dL (ref 3.5–5.0)
Alkaline Phosphatase: 57 U/L (ref 38–126)
Anion gap: 5 (ref 5–15)
BUN: 14 mg/dL (ref 6–20)
CO2: 27 mmol/L (ref 22–32)
Calcium: 8.8 mg/dL — ABNORMAL LOW (ref 8.9–10.3)
Chloride: 106 mmol/L (ref 98–111)
Creatinine, Ser: 0.45 mg/dL (ref 0.44–1.00)
GFR, Estimated: >60 mL/min (ref >60)
Glucose, Bld: 97 mg/dL (ref 70–99)
Potassium: 4.1 mmol/L (ref 3.5–5.1)
Sodium: 138 mmol/L (ref 135–145)
Total Bilirubin: 0.9 mg/dL (ref 0.3–1.2)
Total Protein: 6.9 g/dL (ref 6.5–8.1)

## 2021-09-06 LAB — TROPONIN I (HIGH SENSITIVITY): Troponin I (High Sensitivity): 2 ng/L (ref <18)

## 2021-09-06 LAB — MAGNESIUM: Magnesium: 2 mg/dL (ref 1.7–2.4)

## 2021-09-06 LAB — RETICULOCYTES
Immature Retic Fract: 30.1 % — ABNORMAL HIGH (ref 2.3–15.9)
RBC.: 2.75 MIL/uL — ABNORMAL LOW (ref 3.87–5.11)
Retic Count, Absolute: 103.4 10*3/uL (ref 19.0–186.0)
Retic Ct Pct: 3.8 % — ABNORMAL HIGH (ref 0.4–3.1)

## 2021-09-06 LAB — PROCALCITONIN: Procalcitonin: <0.1 ng/mL

## 2021-09-06 MED ORDER — SODIUM CHLORIDE 0.9 % IV SOLN
500.0000 mg | INTRAVENOUS | Status: DC
  Filled 2021-09-06: qty 500

## 2021-09-06 MED ORDER — SODIUM CHLORIDE 0.9 % IV SOLN: 500.0000 mg | INTRAVENOUS | Status: DC

## 2021-09-06 MED ORDER — CEFTRIAXONE SODIUM 2 G IJ SOLR: 2.0000 g | INTRAMUSCULAR | Status: DC

## 2021-09-06 MED ORDER — HYDROMORPHONE HCL 1 MG/ML IJ SOLN
1.0000 mg | Freq: Once | INTRAMUSCULAR | Status: AC
  Administered 2021-09-06: 1 mg via INTRAVENOUS
  Filled 2021-09-06: qty 1

## 2021-09-06 MED ORDER — SODIUM CHLORIDE 0.9% FLUSH
10.0000 mL | Freq: Two times a day (BID) | INTRAVENOUS | Status: DC
  Administered 2021-09-07 – 2021-09-09 (×3): 10 mL

## 2021-09-06 MED ORDER — SODIUM CHLORIDE 0.9% FLUSH: 10.0000 mL | INTRAVENOUS | Status: DC | PRN

## 2021-09-06 MED ORDER — HYDROMORPHONE 1 MG/ML IV SOLN
INTRAVENOUS | Status: DC
  Administered 2021-09-06: 30 mg via INTRAVENOUS
  Administered 2021-09-06: 3.6 mg via INTRAVENOUS
  Administered 2021-09-06: 3 mg via INTRAVENOUS
  Administered 2021-09-06: 4.2 mg via INTRAVENOUS
  Administered 2021-09-07: 2.4 mg via INTRAVENOUS
  Administered 2021-09-07: 30 mg via INTRAVENOUS
  Administered 2021-09-07: 6 mg via INTRAVENOUS
  Administered 2021-09-07: 3.6 mg via INTRAVENOUS
  Filled 2021-09-06 (×2): qty 30

## 2021-09-06 MED ORDER — AZITHROMYCIN 250 MG PO TABS
500.0000 mg | ORAL_TABLET | Freq: Every day | ORAL | Status: DC
  Administered 2021-09-06 – 2021-09-07 (×2): 500 mg via ORAL
  Filled 2021-09-06 (×2): qty 2

## 2021-09-06 MED ORDER — SODIUM CHLORIDE 0.9 % IV SOLN
2.0000 g | INTRAVENOUS | Status: DC
  Administered 2021-09-06 – 2021-09-07 (×2): 2 g via INTRAVENOUS
  Filled 2021-09-06 (×3): qty 20

## 2021-09-06 NOTE — Progress Notes (Signed)
Subjective: Patient is a 30 year old who was admitted last night with sickle cell painful crisis, chest pain with suspicion for possible acute chest.  Patient is still complaining of right-sided chest wall pain.  It sounds like pleuritic chest pain.  Patient is not hypoxic.  Not having any significant cough.  She describes her chest pain as similar to previous time she had acute chest syndrome.  She has been empirically on antibiotics overnight.  Has leukocytosis which improved but no fever no chills.  She is otherwise having issues with IV access.  Patient has voiced her desire to have a Port-A-Cath due to frequent hospitalizations and difficulty with IV access.  Objective: Vital signs in last 24 hours: Temp:  [97.5 F (36.4 C)-98.4 F (36.9 C)] 97.5 F (36.4 C) (11/06 1028) Pulse Rate:  [63-109] 66 (11/06 1028) Resp:  [15-18] 17 (11/06 1131) BP: (98-124)/(56-89) 98/56 (11/06 1028) SpO2:  [98 %-100 %] 100 % (11/06 1131) FiO2 (%):  [47 %] 47 % (11/06 0025) Weight change:     Intake/Output from previous day: 11/05 0701 - 11/06 0700 In: 600 [IV Piggyback:600] Out: -  Intake/Output this shift: Total I/O In: 240 [P.O.:240] Out: -   General appearance: alert, cooperative, appears stated age, and no distress Neck: no adenopathy, no carotid bruit, no JVD, supple, symmetrical, trachea midline, and thyroid not enlarged, symmetric, no tenderness/mass/nodules Back: symmetric, no curvature. ROM normal. No CVA tenderness. Resp: clear to auscultation bilaterally Cardio: regular rate and rhythm, S1, S2 normal, no murmur, click, rub or gallop GI: soft, non-tender; bowel sounds normal; no masses,  no organomegaly Extremities: extremities normal, atraumatic, no cyanosis or edema Pulses: 2+ and symmetric Skin: Skin color, texture, turgor normal. No rashes or lesions Neurologic: Grossly normal  Lab Results: Recent Labs    09/05/21 1737 09/06/21 0548  WBC 12.2* 11.4*  HGB 9.1* 8.1*  HCT 25.8*  23.3*  PLT 557* 522*   BMET Recent Labs    09/05/21 1737 09/06/21 0548  NA 139 138  K 3.9 4.1  CL 109 106  CO2 25 27  GLUCOSE 94 97  BUN 10 14  CREATININE 0.56 0.45  CALCIUM 9.0 8.8*    Studies/Results: CT Angio Chest PE W and/or Wo Contrast  Result Date: 09/05/2021 CLINICAL DATA:  Concern for sickle cell crisis, chest pain. EXAM: CT ANGIOGRAPHY CHEST WITH CONTRAST TECHNIQUE: Multidetector CT imaging of the chest was performed using the standard protocol during bolus administration of intravenous contrast. Multiplanar CT image reconstructions and MIPs were obtained to evaluate the vascular anatomy. CONTRAST:  47mL OMNIPAQUE IOHEXOL 350 MG/ML SOLN COMPARISON:  CT February 10, 2019 FINDINGS: Cardiovascular: Satisfactory opacification of the pulmonary arteries to the segmental level. No evidence of pulmonary embolism. No thoracic aortic aneurysm or dissection. Normal heart size. No pericardial effusion. Mediastinum/Nodes: No discrete thyroid nodule. No pathologically enlarged mediastinal, hilar or axillary lymph nodes. The trachea and esophagus are unremarkable. Lungs/Pleura: Tiny right-sided pleural effusion. Peripheral nodular consolidations in the right lower lobe with a similar appearing nodular consolidation in the peripheral left lower lobe. Upper Abdomen: No acute abnormality. Musculoskeletal: Osseous sequela of sickle cell disease. No acute osseous abnormality. Review of the MIP images confirms the above findings. IMPRESSION: 1. No evidence of pulmonary embolism. 2. Tiny right-sided pleural effusion with peripheral nodular consolidations in the right lower lobe as well as a similar appearing nodular consolidation in the peripheral left lower lobe. Findings are favored to reflect an infectious or inflammatory process as can be seen with acute  chest syndrome of sickle cell disease. Electronically Signed   By: Dahlia Bailiff M.D.   On: 09/05/2021 19:16    Medications: I have reviewed the  patient's current medications.  Assessment/Plan: A 30 year old female here with sickle cell painful crisis.  #1 sickle cell painful crisis: Patient admitted and is currently on Dilaudid PCA, Toradol and IV fluids.  She has lost IV access today but has gotten a midline.  We will resume these and continue with pain control.  Patient most likely will need to get a Port-A-Cath in the near future.  #2 anemia of chronic disease: Patient's hemoglobin has dropped slightly to 8.1 but thus baseline.  Continue monitoring.  No transfusion at this point.  #3 suspected acute chest syndrome: Not quite consistent.  Not hypoxic.  Her chest findings may be chronic.  For now continue with IV antibiotics.  If no fever or shortness of breath next 24 hours will discontinue antibiotics.  This could all be due to crisis.  #4 chronic pain syndrome: Continue long-acting home medications.  #5 depression with anxiety and adjustment disorders: Continue with home regimen.  Patient currently on Cymbalta.  LOS: 1 day   Darsh Vandevoort,LAWAL 09/06/2021, 3:51 PM

## 2021-09-06 NOTE — Progress Notes (Signed)
PHARMACIST - PHYSICIAN COMMUNICATION  CONCERNING: Antibiotic IV to Oral Route Change Policy  RECOMMENDATION: This patient is receiving azithromycin by the intravenous route.  Based on criteria approved by the Pharmacy and Therapeutics Committee, the antibiotic(s) is/are being converted to the equivalent oral dose form(s).   DESCRIPTION: These criteria include:  Patient being treated for a respiratory tract infection, urinary tract infection, cellulitis or clostridium difficile associated diarrhea if on metronidazole  The patient is not neutropenic and does not exhibit a GI malabsorption state  The patient is eating (either orally or via tube) and/or has been taking other orally administered medications for a least 24 hours  The patient is improving clinically and has a Tmax < 100.5  If you have questions about this conversion, please contact the Pharmacy Department  []  ( [phone] )  [place] []  ( [phone]  [facility] []  ( [phone]  [place] []  ( [phone]  [facility] [x]  ( [phone] )  [facility]  

## 2021-09-06 NOTE — Plan of Care (Signed)

## 2021-09-07 DIAGNOSIS — D5700 Hb-SS disease with crisis, unspecified: Secondary | ICD-10-CM | POA: Diagnosis not present

## 2021-09-07 LAB — COMPREHENSIVE METABOLIC PANEL
ALT: 18 U/L (ref 0–44)
AST: 23 U/L (ref 15–41)
Albumin: 3.7 g/dL (ref 3.5–5.0)
Alkaline Phosphatase: 60 U/L (ref 38–126)
Anion gap: 5 (ref 5–15)
BUN: 10 mg/dL (ref 6–20)
CO2: 28 mmol/L (ref 22–32)
Calcium: 9 mg/dL (ref 8.9–10.3)
Chloride: 103 mmol/L (ref 98–111)
Creatinine, Ser: 0.47 mg/dL (ref 0.44–1.00)
GFR, Estimated: >60 mL/min (ref >60)
Glucose, Bld: 101 mg/dL — ABNORMAL HIGH (ref 70–99)
Potassium: 3.9 mmol/L (ref 3.5–5.1)
Sodium: 136 mmol/L (ref 135–145)
Total Bilirubin: 0.6 mg/dL (ref 0.3–1.2)
Total Protein: 7.1 g/dL (ref 6.5–8.1)

## 2021-09-07 LAB — CBC WITH DIFFERENTIAL/PLATELET
Abs Immature Granulocytes: 0.03 10*3/uL (ref 0.00–0.07)
Basophils Absolute: 0 10*3/uL (ref 0.0–0.1)
Basophils Relative: 0 %
Eosinophils Absolute: 0.4 10*3/uL (ref 0.0–0.5)
Eosinophils Relative: 4 %
HCT: 24.3 % — ABNORMAL LOW (ref 36.0–46.0)
Hemoglobin: 8.5 g/dL — ABNORMAL LOW (ref 12.0–15.0)
Immature Granulocytes: 0 %
Lymphocytes Relative: 36 %
Lymphs Abs: 3.3 10*3/uL (ref 0.7–4.0)
MCH: 29.5 pg (ref 26.0–34.0)
MCHC: 35 g/dL (ref 30.0–36.0)
MCV: 84.4 fL (ref 80.0–100.0)
Monocytes Absolute: 1.2 10*3/uL — ABNORMAL HIGH (ref 0.1–1.0)
Monocytes Relative: 13 %
Neutro Abs: 4.3 10*3/uL (ref 1.7–7.7)
Neutrophils Relative %: 47 %
Platelets: 549 10*3/uL — ABNORMAL HIGH (ref 150–400)
RBC: 2.88 MIL/uL — ABNORMAL LOW (ref 3.87–5.11)
RDW: 14.3 % (ref 11.5–15.5)
WBC: 9.1 10*3/uL (ref 4.0–10.5)
nRBC: 0.2 % (ref 0.0–0.2)

## 2021-09-07 MED ORDER — OXYCODONE HCL 5 MG PO TABS
10.0000 mg | ORAL_TABLET | ORAL | Status: DC | PRN
  Administered 2021-09-07 – 2021-09-08 (×5): 10 mg via ORAL
  Filled 2021-09-07 (×5): qty 2

## 2021-09-07 MED ORDER — HYDROMORPHONE 1 MG/ML IV SOLN
INTRAVENOUS | Status: DC
  Administered 2021-09-07: 4.8 mg via INTRAVENOUS
  Administered 2021-09-08: 3.84 mg via INTRAVENOUS
  Administered 2021-09-08: 30 mg via INTRAVENOUS
  Administered 2021-09-08: 6.6 mg via INTRAVENOUS
  Filled 2021-09-07: qty 30

## 2021-09-07 MED ORDER — LIP MEDEX EX OINT
TOPICAL_OINTMENT | CUTANEOUS | Status: DC | PRN
  Filled 2021-09-07: qty 7

## 2021-09-07 NOTE — Progress Notes (Signed)
Subjective: Carmen [name] is a 30 year old with a medical history significant for sickle cell disease, chronic pain syndrome, major depression, and history of anemia of chronic disease was admitted for possible acute chest syndrome in the setting of sickle cell pain crisis.  Patient continues to endorse right-sided chest wall pain.  She has been afebrile overnight. She currently rates her pain as 10/10 characterized as constant and aching.  She denies any shortness of breath, dizziness, headache, nausea, vomiting, or diarrhea.  Objective:  Vital signs in last 24 hours:  Vitals:   09/07/21 0720 09/07/21 1038 09/07/21 1152 09/07/21 1415  BP:  106/67    Pulse:  79    Resp: 17 14 17 17   Temp:  98.2 F (36.8 C)    TempSrc:  Oral    SpO2: 100% 100% 100% 99%  Weight:      Height:        Intake/Output from previous day:   Intake/Output Summary (Last 24 hours) at 09/07/2021 1706 Last data filed at 09/07/2021 1039 Gross per 24 hour  Intake 4690.31 ml  Output --  Net 4690.31 ml    Physical Exam: General: Alert, awake, oriented x3, in no acute distress.  HEENT: [place]/AT PEERL, EOMI Neck: Trachea midline,  no masses, no thyromegal,y no JVD, no carotid bruit OROPHARYNX:  Moist, No exudate/ erythema/lesions.  Heart: Regular rate and rhythm, without murmurs, rubs, gallops, PMI non-displaced, no heaves or thrills on palpation.  Lungs: Clear to auscultation, no wheezing or rhonchi noted. No increased vocal fremitus resonant to percussion  Abdomen: Soft, nontender, nondistended, positive bowel sounds, no masses no hepatosplenomegaly noted..  Neuro: No focal neurological deficits noted cranial nerves II through XII grossly intact. DTRs 2+ bilaterally upper and lower extremities. Strength 5 out of 5 in bilateral upper and lower extremities. Musculoskeletal: No warm swelling or erythema around joints, no spinal tenderness noted. Psychiatric: Patient alert and oriented x3, good insight and cognition,  good recent to remote recall. Lymph node survey: No cervical axillary or inguinal lymphadenopathy noted.  Lab Results:  Basic Metabolic Panel:    Component Value Date/Time   NA 136 09/07/2021 0520   NA 142 07/02/2020 1040   K 3.9 09/07/2021 0520   CL 103 09/07/2021 0520   CO2 28 09/07/2021 0520   BUN 10 09/07/2021 0520   BUN 8 07/02/2020 1040   CREATININE 0.47 09/07/2021 0520   GLUCOSE 101 (H) 09/07/2021 0520   CALCIUM 9.0 09/07/2021 0520   CBC:    Component Value Date/Time   WBC 9.1 09/07/2021 0520   HGB 8.5 (L) 09/07/2021 0520   HGB 9.8 (L) 07/02/2020 1040   HCT 24.3 (L) 09/07/2021 0520   HCT 28.9 (L) 07/02/2020 1040   PLT 549 (H) 09/07/2021 0520   PLT 628 (H) 07/02/2020 1040   MCV 84.4 09/07/2021 0520   MCV 88 07/02/2020 1040   NEUTROABS 4.3 09/07/2021 0520   NEUTROABS 3.0 07/02/2020 1040   LYMPHSABS 3.3 09/07/2021 0520   LYMPHSABS 3.5 (H) 07/02/2020 1040   MONOABS 1.2 (H) 09/07/2021 0520   EOSABS 0.4 09/07/2021 0520   EOSABS 0.2 07/02/2020 1040   BASOSABS 0.0 09/07/2021 0520   BASOSABS 0.1 07/02/2020 1040    Recent Results (from the past 240 hour(s))  Resp Panel by RT-PCR (Flu A&B, Covid) Nasopharyngeal Swab     Status: None   Collection Time: 09/05/21  8:19 PM   Specimen: Nasopharyngeal Swab; Nasopharyngeal(NP) swabs in vial transport medium  Result Value Ref Range Status  SARS Coronavirus 2 by RT PCR NEGATIVE NEGATIVE Final    Comment: (NOTE) SARS-CoV-2 target nucleic acids are NOT DETECTED.  The SARS-CoV-2 RNA is generally detectable in upper respiratory specimens during the acute phase of infection. The lowest concentration of SARS-CoV-2 viral copies this assay can detect is 138 copies/mL. A negative result does not preclude SARS-Cov-2 infection and should not be used as the sole basis for treatment or other patient management decisions. A negative result may occur with  improper specimen collection/handling, submission of specimen other than  nasopharyngeal swab, presence of viral mutation(s) within the areas targeted by this assay, and inadequate number of viral copies(<138 copies/mL). A negative result must be combined with clinical observations, patient history, and epidemiological information. The expected result is Negative.  Fact Sheet for Patients:  EntrepreneurPulse.com.au  Fact Sheet for Healthcare Providers:  IncredibleEmployment.be  This test is no t yet approved or cleared by the [place] FDA and  has been authorized for detection and/or diagnosis of SARS-CoV-2 by FDA under an Emergency Use Authorization (EUA). This EUA will remain  in effect (meaning this test can be used) for the duration of the COVID-19 declaration under Section 564(b)(1) of the Act, 21 U.S.C.section 360bbb-3(b)(1), unless the authorization is terminated  or revoked sooner.       Influenza A by PCR NEGATIVE NEGATIVE Final   Influenza B by PCR NEGATIVE NEGATIVE Final    Comment: (NOTE) The Xpert Xpress SARS-CoV-2/FLU/RSV plus assay is intended as an aid in the diagnosis of influenza from Nasopharyngeal swab specimens and should not be used as a sole basis for treatment. Nasal washings and aspirates are unacceptable for Xpert Xpress SARS-CoV-2/FLU/RSV testing.  Fact Sheet for Patients: EntrepreneurPulse.com.au  Fact Sheet for Healthcare Providers: IncredibleEmployment.be  This test is not yet approved or cleared by the [place] FDA and has been authorized for detection and/or diagnosis of SARS-CoV-2 by FDA under an Emergency Use Authorization (EUA). This EUA will remain in effect (meaning this test can be used) for the duration of the COVID-19 declaration under Section 564(b)(1) of the Act, 21 U.S.C. section 360bbb-3(b)(1), unless the authorization is terminated or revoked.  Performed at [facility], [place] [address], [place], [place] [address]   Culture, blood (routine x 2)     Status: None (Preliminary result)   Collection Time: 09/05/21  8:56 PM   Specimen: BLOOD  Result Value Ref Range Status   Specimen Description   Final    BLOOD RIGHT ANTECUBITAL Performed at [facility] [address], [employer], [place] [address]    Special Requests   Final    BOTTLES DRAWN AEROBIC AND ANAEROBIC Blood Culture adequate volume Performed at [facility] [address], [place], [place] [address]    Culture   Final    NO GROWTH 1 DAY Performed at [facility], [place] [address], [place], [place] [address]    Report Status PENDING  Incomplete  Culture, blood (routine x 2)     Status: None (Preliminary result)   Collection Time: 09/06/21  7:09 AM   Specimen: BLOOD  Result Value Ref Range Status   Specimen Description   Final    BLOOD SITE NOT SPECIFIED Performed at [facility] [address], [place], [place] [address]    Special Requests   Final    BOTTLES DRAWN AEROBIC AND ANAEROBIC Blood Culture adequate volume Performed at [facility] [address] [place],  [place] [address]    Culture   Final    NO GROWTH < 24 HOURS Performed at [facility], [place] [address] [place], [place] [address]    Report Status PENDING  Incomplete    Studies/Results: CT Angio Chest PE W and/or Wo Contrast  Result Date: 09/05/2021 CLINICAL DATA:  Concern for sickle cell crisis, chest pain. EXAM: CT ANGIOGRAPHY CHEST WITH CONTRAST TECHNIQUE: Multidetector CT imaging of the chest was performed using the standard protocol during bolus administration of intravenous contrast. Multiplanar CT image reconstructions and MIPs were obtained to evaluate the vascular anatomy. CONTRAST:  36mL OMNIPAQUE IOHEXOL 350 MG/ML SOLN COMPARISON:  CT February 10, 2019 FINDINGS: Cardiovascular: Satisfactory opacification of the pulmonary arteries to the  segmental level. No evidence of pulmonary embolism. No thoracic aortic aneurysm or dissection. Normal heart size. No pericardial effusion. Mediastinum/Nodes: No discrete thyroid nodule. No pathologically enlarged mediastinal, hilar or axillary lymph nodes. The trachea and esophagus are unremarkable. Lungs/Pleura: Tiny right-sided pleural effusion. Peripheral nodular consolidations in the right lower lobe with a similar appearing nodular consolidation in the peripheral left lower lobe. Upper Abdomen: No acute abnormality. Musculoskeletal: Osseous sequela of sickle cell disease. No acute osseous abnormality. Review of the MIP images confirms the above findings. IMPRESSION: 1. No evidence of pulmonary embolism. 2. Tiny right-sided pleural effusion with peripheral nodular consolidations in the right lower lobe as well as a similar appearing nodular consolidation in the peripheral left lower lobe. Findings are favored to reflect an infectious or inflammatory process as can be seen with acute chest syndrome of sickle cell disease. Electronically Signed   By: Dahlia Bailiff M.D.   On: 09/05/2021 19:16    Medications: Scheduled Meds:  azithromycin  500 mg Oral QHS   DULoxetine  60 mg Oral Daily   enoxaparin (LOVENOX) injection  40 mg Subcutaneous C16L   folic acid  1 mg Oral Daily   HYDROmorphone   Intravenous Q4H   morphine  60 mg Oral Q12H   prenatal multivitamin  1 tablet Oral Daily   sodium chloride flush  10-40 mL Intracatheter Q12H   Continuous Infusions:  sodium chloride 10 mL/hr at 09/07/21 1347   cefTRIAXone (ROCEPHIN)  IV 2 g (09/06/21 1953)   PRN Meds:.acetaminophen **OR** acetaminophen, cyclobenzaprine, diphenhydrAMINE, lip balm, melatonin, ondansetron **OR** ondansetron (ZOFRAN) IV, oxyCODONE, polyethylene glycol, sodium chloride flush  Consultants: None  Procedures: None   Antibiotics: IV cefazolin  Assessment/Plan: Principal Problem:   Sickle cell crisis ([place]) Active  Problems:   Major depressive disorder   Acute chest syndrome due to sickle cell crisis (HCC)  Sickle cell disease with pain crisis: Continue IV Dilaudid PCA Oxycodone 10 mg every 12 hours as needed Toradol 15 mg IV every 6 hours Monitor vital signs very closely, reevaluate pain scale regularly, and supplemental oxygen as needed  Anemia of chronic disease: Hemoglobin stable and consistent with patient's baseline.  There is no clinical need for blood transfusion at this time.  Follow closely.  Suspected acute chest syndrome: Patient does not have an oxygen requirement.  96% on room air.  Empiric antibiotics continued overnight.  If patient remains afebrile, will consider discontinuing IV antibiotics in AM.  Chronic pain syndrome: Continue home medications   History of major depression: Continue home medications.  No suicidal homicidal intentions today.   Code Status: Full Code Family Communication: N/A Disposition Plan: Not yet ready for discharge  [name]  If 7PM-7AM, please contact night-coverage.  09/07/2021, 5:06 PM  LOS: 2  days

## 2021-09-08 MED ORDER — HYDROMORPHONE 1 MG/ML IV SOLN
INTRAVENOUS | Status: DC
  Administered 2021-09-09: 30 mg via INTRAVENOUS
  Administered 2021-09-09: 12.6 mg via INTRAVENOUS
  Administered 2021-09-09: 4.8 mg via INTRAVENOUS
  Filled 2021-09-08: qty 30

## 2021-09-08 NOTE — Progress Notes (Signed)
Patient called out stating that her IV was leaking. Upon assessment RN did not find any problems with tubing or at site. RN left room to get supplies to change dressing. Upon reentry to room patient was holding IV in hand. When RN asked what happen pt became very accusatory stating it was the nurses fault IV came out and used racial undertones. Patient requested MD be called. MD called and saw patient at bedside. IV replaced by IV team.

## 2021-09-08 NOTE — Progress Notes (Signed)
Subjective: Carmen [name] is a 30 year old female with a medical history significant for sickle cell disease, chronic pain syndrome, opiate dependence and tolerance, and history of anemia of chronic disease was admitted for sickle cell pain crisis. Today, patient is complaining of worsening pain primarily to lower extremities and low back.  She has poor venous access and midline was inserted on admission.  However, Dislodged this a.m. and patient has been without IV access. She currently denies headache, chest pain, urinary symptoms, nausea, vomiting, or diarrhea.  Objective:  Vital signs in last 24 hours:  Vitals:   09/08/21 1317 09/08/21 1821 09/08/21 1916 09/08/21 2222  BP: 100/88 99/68  (!) 112/94  Pulse: 76 84  (!) 101  Resp: 18 17    Temp: 98.2 F (36.8 C) 98.6 F (37 C)  98 F (36.7 C)  TempSrc: Oral Oral  Oral  SpO2:  100% 95% 93%  Weight:      Height:        Intake/Output from previous day:   Intake/Output Summary (Last 24 hours) at 09/09/2021 0013 Last data filed at 09/08/2021 2208 Gross per 24 hour  Intake 1420 ml  Output --  Net 1420 ml    Physical Exam: General: Alert, awake, oriented x3, in no acute distress.  HEENT: [place]/AT PEERL, EOMI Neck: Trachea midline,  no masses, no thyromegal,y no JVD, no carotid bruit OROPHARYNX:  Moist, No exudate/ erythema/lesions.  Heart: Regular rate and rhythm, without murmurs, rubs, gallops, PMI non-displaced, no heaves or thrills on palpation.  Lungs: Clear to auscultation, no wheezing or rhonchi noted. No increased vocal fremitus resonant to percussion  Abdomen: Soft, nontender, nondistended, positive bowel sounds, no masses no hepatosplenomegaly noted..  Neuro: No focal neurological deficits noted cranial nerves II through XII grossly intact. DTRs 2+ bilaterally upper and lower extremities. Strength 5 out of 5 in bilateral upper and lower extremities. Musculoskeletal: No warm swelling or erythema around joints, no spinal  tenderness noted. Psychiatric: Patient alert and oriented x3, good insight and cognition, good recent to remote recall. Lymph node survey: No cervical axillary or inguinal lymphadenopathy noted.  Lab Results:  Basic Metabolic Panel:    Component Value Date/Time   NA 136 09/07/2021 0520   NA 142 07/02/2020 1040   K 3.9 09/07/2021 0520   CL 103 09/07/2021 0520   CO2 28 09/07/2021 0520   BUN 10 09/07/2021 0520   BUN 8 07/02/2020 1040   CREATININE 0.47 09/07/2021 0520   GLUCOSE 101 (H) 09/07/2021 0520   CALCIUM 9.0 09/07/2021 0520   CBC:    Component Value Date/Time   WBC 9.1 09/07/2021 0520   HGB 8.5 (L) 09/07/2021 0520   HGB 9.8 (L) 07/02/2020 1040   HCT 24.3 (L) 09/07/2021 0520   HCT 28.9 (L) 07/02/2020 1040   PLT 549 (H) 09/07/2021 0520   PLT 628 (H) 07/02/2020 1040   MCV 84.4 09/07/2021 0520   MCV 88 07/02/2020 1040   NEUTROABS 4.3 09/07/2021 0520   NEUTROABS 3.0 07/02/2020 1040   LYMPHSABS 3.3 09/07/2021 0520   LYMPHSABS 3.5 (H) 07/02/2020 1040   MONOABS 1.2 (H) 09/07/2021 0520   EOSABS 0.4 09/07/2021 0520   EOSABS 0.2 07/02/2020 1040   BASOSABS 0.0 09/07/2021 0520   BASOSABS 0.1 07/02/2020 1040    Recent Results (from the past 240 hour(s))  Resp Panel by RT-PCR (Flu A&B, Covid) Nasopharyngeal Swab     Status: None   Collection Time: 09/05/21  8:19 PM   Specimen: Nasopharyngeal Swab; Nasopharyngeal(NP)  swabs in vial transport medium  Result Value Ref Range Status   SARS Coronavirus 2 by RT PCR NEGATIVE NEGATIVE Final    Comment: (NOTE) SARS-CoV-2 target nucleic acids are NOT DETECTED.  The SARS-CoV-2 RNA is generally detectable in upper respiratory specimens during the acute phase of infection. The lowest concentration of SARS-CoV-2 viral copies this assay can detect is 138 copies/mL. A negative result does not preclude SARS-Cov-2 infection and should not be used as the sole basis for treatment or other patient management decisions. A negative result may  occur with  improper specimen collection/handling, submission of specimen other than nasopharyngeal swab, presence of viral mutation(s) within the areas targeted by this assay, and inadequate number of viral copies(<138 copies/mL). A negative result must be combined with clinical observations, patient history, and epidemiological information. The expected result is Negative.  Fact Sheet for Patients:  EntrepreneurPulse.com.au  Fact Sheet for Healthcare Providers:  IncredibleEmployment.be  This test is no t yet approved or cleared by the [place] FDA and  has been authorized for detection and/or diagnosis of SARS-CoV-2 by FDA under an Emergency Use Authorization (EUA). This EUA will remain  in effect (meaning this test can be used) for the duration of the COVID-19 declaration under Section 564(b)(1) of the Act, 21 U.S.C.section 360bbb-3(b)(1), unless the authorization is terminated  or revoked sooner.       Influenza A by PCR NEGATIVE NEGATIVE Final   Influenza B by PCR NEGATIVE NEGATIVE Final    Comment: (NOTE) The Xpert Xpress SARS-CoV-2/FLU/RSV plus assay is intended as an aid in the diagnosis of influenza from Nasopharyngeal swab specimens and should not be used as a sole basis for treatment. Nasal washings and aspirates are unacceptable for Xpert Xpress SARS-CoV-2/FLU/RSV testing.  Fact Sheet for Patients: EntrepreneurPulse.com.au  Fact Sheet for Healthcare Providers: IncredibleEmployment.be  This test is not yet approved or cleared by the [place] FDA and has been authorized for detection and/or diagnosis of SARS-CoV-2 by FDA under an Emergency Use Authorization (EUA). This EUA will remain in effect (meaning this test can be used) for the duration of the COVID-19 declaration under Section 564(b)(1) of the Act, 21 U.S.C. section 360bbb-3(b)(1), unless the authorization is terminated  or revoked.  Performed at [facility], [place] [address], [place], [place] [address]   Culture, blood (routine x 2)     Status: None (Preliminary result)   Collection Time: 09/05/21  8:56 PM   Specimen: BLOOD  Result Value Ref Range Status   Specimen Description   Final    BLOOD RIGHT ANTECUBITAL Performed at [facility] [address] [place], [place] [address]    Special Requests   Final    BOTTLES DRAWN AEROBIC AND ANAEROBIC Blood Culture adequate volume Performed at [facility] [address] [place], [place] [address]    Culture   Final    NO GROWTH 2 DAYS Performed at [facility] [address], [place], [place] [address]    Report Status PENDING  Incomplete  Culture, blood (routine x 2)     Status: None (Preliminary result)   Collection Time: 09/06/21  7:09 AM   Specimen: BLOOD  Result Value Ref Range Status   Specimen Description   Final    BLOOD SITE NOT SPECIFIED Performed at [facility] [address] [place], [place] [address]    Special Requests   Final    BOTTLES DRAWN AEROBIC AND ANAEROBIC Blood Culture  adequate volume Performed at [facility] [address] [place], [place] [address]    Culture   Final    NO GROWTH 2 DAYS Performed at [facility] [address] [place], [place] [address]    Report Status PENDING  Incomplete    Studies/Results: No results found.  Medications: Scheduled Meds:  DULoxetine  60 mg Oral Daily   enoxaparin (LOVENOX) injection  40 mg Subcutaneous M62M   folic acid  1 mg Oral Daily   HYDROmorphone   Intravenous Q4H   morphine  60 mg Oral Q12H   prenatal multivitamin  1 tablet Oral Daily   sodium chloride flush  10-40 mL Intracatheter Q12H   Continuous Infusions:  sodium chloride 10 mL/hr at 09/07/21 1347   PRN Meds:.acetaminophen **OR** acetaminophen, cyclobenzaprine, diphenhydrAMINE, lip balm,  melatonin, ondansetron **OR** ondansetron (ZOFRAN) IV, polyethylene glycol, sodium chloride flush  Consultants: None  Procedures: None  Antibiotics: None  Assessment/Plan: Principal Problem:   Sickle cell crisis ([place]) Active Problems:   Major depressive disorder   Acute chest syndrome due to sickle cell crisis (HCC)  Sickle cell disease with pain crisis: Continue IV Dilaudid PCA Oxycodone 10 mg every 4 hours as needed for severe breakthrough pain Toradol 15 mg IV every 6 hours Monitor vital signs very closely, reevaluate pain scale regularly, and supplemental oxygen as needed  Anemia of chronic disease: Hemoglobin remained stable and consistent with patient's baseline.  There is no clinical indication for blood transfusion at this time.  Continue to follow closely.  Suspected acute chest syndrome: Patient is afebrile, no oxygen requirement.  Oxygen saturation 98% on RA.  Will discontinue empiric antibiotics.  No further antibiotics warranted at this time.  Monitor closely.  Chronic pain syndrome: Continue home medications  History of major depression: Stable.  Continue home medications.  No suicidal or homicidal ideations.  Code Status: Full Code Family Communication: N/A Disposition Plan: Not yet ready for discharge  [place], MSN, FNP-C [facility] [address] [place], [place] [address] [phone]  If 5PM-8AM, please contact night-coverage.  09/09/2021, 12:13 AM  LOS: 4 days

## 2021-09-09 DIAGNOSIS — D5700 Hb-SS disease with crisis, unspecified: Secondary | ICD-10-CM | POA: Diagnosis not present

## 2021-09-09 LAB — TYPE AND SCREEN
ABO/RH(D): O POS
Antibody Screen: NEGATIVE

## 2021-09-09 LAB — CBC
HCT: 24.6 % — ABNORMAL LOW (ref 36.0–46.0)
Hemoglobin: 8.7 g/dL — ABNORMAL LOW (ref 12.0–15.0)
MCH: 28.9 pg (ref 26.0–34.0)
MCHC: 35.4 g/dL (ref 30.0–36.0)
MCV: 81.7 fL (ref 80.0–100.0)
Platelets: 498 10*3/uL — ABNORMAL HIGH (ref 150–400)
RBC: 3.01 MIL/uL — ABNORMAL LOW (ref 3.87–5.11)
RDW: 14.4 % (ref 11.5–15.5)
WBC: 12.3 10*3/uL — ABNORMAL HIGH (ref 4.0–10.5)
nRBC: 0.2 % (ref 0.0–0.2)

## 2021-09-09 MED ORDER — OXYCODONE HCL 10 MG PO TABS: 10.0000 mg | ORAL_TABLET | Freq: Four times a day (QID) | ORAL | 0 refills | Status: DC | PRN

## 2021-09-11 LAB — CULTURE, BLOOD (ROUTINE X 2)
Culture: NO GROWTH
Culture: NO GROWTH
Special Requests: ADEQUATE
Special Requests: ADEQUATE

## 2021-09-11 NOTE — Discharge Summary (Signed)
Physician Discharge Summary  Carmen [name] ZOX:[id number] DOB: [birth year] DOA: 09/05/2021  PCP: Vevelyn Francois, NP  Admit date: 09/05/2021  Discharge date: 09/09/2021  Discharge Diagnoses:  Principal Problem:   Sickle cell crisis ([place]) Active Problems:   Major depressive disorder   Acute chest syndrome due to sickle cell crisis [facility])   Discharge Condition: Stable  Disposition:   Follow-up Information     Vevelyn Francois, NP Follow up.   Specialty: Adult Health Nurse Practitioner Contact information: [address] [name] [address] [phone]                Pt is discharged home in good condition and is to follow up with Vevelyn Francois, NP this week to have labs evaluated. Carmen [name] is instructed to increase activity slowly and balance with rest for the next few days, and use prescribed medication to complete treatment of pain  Diet: Regular Wt Readings from Last 3 Encounters:  09/05/21 81.6 kg  08/26/21 81.2 kg  07/24/21 83.9 kg    History of present illness:  Carmen [name] is a 30 year old female with a past medical history of sickle cell anemia, chronic pain syndrome, major depressive disorder presents to [facility] emergency department with complaints of chest pain and extremity pain. Patient explains that 2 days ago she began to develop generalized body pain similar to previous episodes of acute sickle cell crisis.  Patient states that her pain initially began in her bilateral upper and lower extremities, was sharp in quality, severe in intensity and worse with movement.  Patient attempted to take her home medication regimen of as needed opiates with no improvement in symptoms. Over the following 2 days, patient's symptoms continue to worsen and as her symptoms worsen she began to develop associated chest discomfort.  This chest discomfort began in her lower anterior chest sharp in quality and worse with deep inspiration or movement.  Pain  seems to radiate around her chest laterally to her thoracic spine.  Patient complains of some associated shortness of breath that is mild in intensity.  Patient denies fevers, productive cough, sick contacts, recent travel, or contact with known COVID-19 infection.  Patient symptoms continue to worsen just eventually presented to [facility] emergency department for evaluation.  Upon evaluation in the emergency department, patient was noted to exhibit a mild leukocytosis of 12.2 with no other obvious SIRS criteria.  While patient was complaining of some chest discomfort patient exhibited no evidence of hypoxia typically saturating 100% on room air.  Due to chest discomfort, a D-dimer was performed and found to be somewhat elevated.  This was followed up with a CT angiogram of the chest which revealed no evidence of pulmonary embolism, but did reveal evidence of small right-sided pleural effusion with peripheral nodular consolidations in the right lower lobe as well as similar-appearing nodular consolidation in left lower lobe thought to be an infectious or inflammatory process probably due to acute chest syndrome.  Patient was initiated on intravenous volume resuscitation with isotonic fluids and initiated on IV azithromycin and ceftriaxone.  Hospitalist group was then called to assess the patient for admission to the hospital.  Hospital Course:  Sickle cell disease with pain crisis: Patient was admitted for sickle cell pain crisis and managed appropriately with IVF, IV Dilaudid via PCA and IV Toradol, as well as other adjunct therapies per sickle cell pain management protocols.  IV Dilaudid PCA was weaned appropriately and she was transition  to her home medications.  Patient advised to resume home medications.  She is requesting discharge.  Her pain intensity is 5/10 and she feels that she can manage on home medication regimen.  Her pain medications are managed by PCP.  NP Edison Pace was notified of  patient's discharge and pain medications will be sent to her pharmacy. Anemia of chronic disease: Patient's hemoglobin remained stable and consistent with her baseline throughout admission.  She will return to PCP in 2 weeks to repeat CBC with differential and CMP.  Possible acute chest syndrome: Patient CT angiogram of the chest did not show any evidence of pulmonary embolism, but did show infectious or inflammatory process that was possibly due to acute chest syndrome.  Patient did not display any hypoxia and oxygen saturation remained 100% on room air throughout admission.  IV antibiotics were de-escalated and eventually discontinued as patient remained afebrile without any other signs of acute chest syndrome.  Patient is afebrile and oxygen saturation 99% on RA.  She is alert, oriented, and ambulating without assistance. Patient was therefore discharged home today in a hemodynamically stable condition.   Carmen [name] will follow-up with PCP within 1 week of this discharge. Carmen [name] was counseled extensively about nonpharmacologic means of pain management, patient verbalized understanding and was appreciative of  the care received during this admission.   We discussed the need for good hydration, monitoring of hydration status, avoidance of heat, cold, stress, and infection triggers. We discussed the need to be adherent with all medications.  Patient is not on any disease modifying agents at this time and may benefit from [place].  She will discuss these medications with her PCP during follow-up appointment.  S. Patient was reminded of the need to seek medical attention immediately if any symptom of bleeding, anemia, or infection occurs.  Discharge Exam: Vitals:   09/09/21 0826 09/09/21 0943  BP:    Pulse:    Resp: 16 17  Temp:    SpO2: 100% 98%   Vitals:   09/09/21 0414 09/09/21 0529 09/09/21 0826 09/09/21 0943  BP:  107/75    Pulse:  93    Resp: 14 16 16 17   Temp:  97.6 F (36.4 C)     TempSrc:  Oral    SpO2:  100% 100% 98%  Weight:      Height:        General appearance : Awake, alert, not in any distress. Speech Clear. Not toxic looking HEENT: Atraumatic and Normocephalic, pupils equally reactive to light and accomodation Neck: Supple, no JVD. No cervical lymphadenopathy.  Chest: Good air entry bilaterally, no added sounds  CVS: S1 S2 regular, no murmurs.  Abdomen: Bowel sounds present, Non tender and not distended with no gaurding, rigidity or rebound. Extremities: B/L Lower Ext shows no edema, both legs are warm to touch Neurology: Awake alert, and oriented X 3, CN II-XII intact, Non focal Skin: No Rash  Discharge Instructions  Discharge Instructions     Discharge patient   Complete by: As directed    Discharge disposition: 01-Home or Self Care   Discharge patient date: 09/09/2021      Allergies as of 09/09/2021       Reactions   Ketamine Hives, Other (See Comments)   Makes feel funny        Medication List     TAKE these medications    citalopram 20 MG tablet Commonly known as: CELEXA TAKE 1 TABLET BY MOUTH EVERY DAY What changed:  when to take this reasons to take this   cyclobenzaprine 10 MG tablet Commonly known as: FLEXERIL TAKE 1 TABLET BY MOUTH TWICE A DAY What changed:  when to take this reasons to take this   DULoxetine 30 MG capsule Commonly known as: CYMBALTA Take 1 capsule (30 mg total) by mouth daily for 3 days, THEN 2 capsules (60 mg total) daily. Start taking on: [birth year] 26, [birth year]64   folic acid 1 MG tablet Commonly known as: FOLVITE Take 1 tablet (1 mg total) by mouth daily.   morphine 60 MG 12 hr tablet Commonly known as: MS Contin Take 1 tablet (60 mg total) by mouth every 12 (twelve) hours.   Prenatal Vitamin Plus Low Iron 27-1 MG Tabs TAKE 1 TABLET BY MOUTH EVERY DAY   Vitamin D (Ergocalciferol) 1.25 MG (50000 UNIT) Caps capsule Commonly known as: DRISDOL TAKE ONE CAPSULE BY MOUTH ONE TIME PER WEEK What  changed:  how much to take how to take this when to take this additional instructions        The results of significant diagnostics from this hospitalization (including imaging, microbiology, ancillary and laboratory) are listed below for reference.    Significant Diagnostic Studies: CT Angio Chest PE W and/or Wo Contrast  Result Date: 09/05/2021 CLINICAL DATA:  Concern for sickle cell crisis, chest pain. EXAM: CT ANGIOGRAPHY CHEST WITH CONTRAST TECHNIQUE: Multidetector CT imaging of the chest was performed using the standard protocol during bolus administration of intravenous contrast. Multiplanar CT image reconstructions and MIPs were obtained to evaluate the vascular anatomy. CONTRAST:  46mL OMNIPAQUE IOHEXOL 350 MG/ML SOLN COMPARISON:  CT February 10, 2019 FINDINGS: Cardiovascular: Satisfactory opacification of the pulmonary arteries to the segmental level. No evidence of pulmonary embolism. No thoracic aortic aneurysm or dissection. Normal heart size. No pericardial effusion. Mediastinum/Nodes: No discrete thyroid nodule. No pathologically enlarged mediastinal, hilar or axillary lymph nodes. The trachea and esophagus are unremarkable. Lungs/Pleura: Tiny right-sided pleural effusion. Peripheral nodular consolidations in the right lower lobe with a similar appearing nodular consolidation in the peripheral left lower lobe. Upper Abdomen: No acute abnormality. Musculoskeletal: Osseous sequela of sickle cell disease. No acute osseous abnormality. Review of the MIP images confirms the above findings. IMPRESSION: 1. No evidence of pulmonary embolism. 2. Tiny right-sided pleural effusion with peripheral nodular consolidations in the right lower lobe as well as a similar appearing nodular consolidation in the peripheral left lower lobe. Findings are favored to reflect an infectious or inflammatory process as can be seen with acute chest syndrome of sickle cell disease. Electronically Signed   By: Dahlia Bailiff M.D.   On: 09/05/2021 19:16    Microbiology: Recent Results (from the past 240 hour(s))  Resp Panel by RT-PCR (Flu A&B, Covid) Nasopharyngeal Swab     Status: None   Collection Time: 09/05/21  8:19 PM   Specimen: Nasopharyngeal Swab; Nasopharyngeal(NP) swabs in vial transport medium  Result Value Ref Range Status   SARS Coronavirus 2 by RT PCR NEGATIVE NEGATIVE Final    Comment: (NOTE) SARS-CoV-2 target nucleic acids are NOT DETECTED.  The SARS-CoV-2 RNA is generally detectable in upper respiratory specimens during the acute phase of infection. The lowest concentration of SARS-CoV-2 viral copies this assay can detect is 138 copies/mL. A negative result does not preclude SARS-Cov-2 infection and should not be used as the sole basis for treatment or other patient management decisions. A negative result may occur with  improper specimen collection/handling, submission of specimen other than nasopharyngeal swab,  presence of viral mutation(s) within the areas targeted by this assay, and inadequate number of viral copies(<138 copies/mL). A negative result must be combined with clinical observations, patient history, and epidemiological information. The expected result is Negative.  Fact Sheet for Patients:  EntrepreneurPulse.com.au  Fact Sheet for Healthcare Providers:  IncredibleEmployment.be  This test is no t yet approved or cleared by the [place] FDA and  has been authorized for detection and/or diagnosis of SARS-CoV-2 by FDA under an Emergency Use Authorization (EUA). This EUA will remain  in effect (meaning this test can be used) for the duration of the COVID-19 declaration under Section 564(b)(1) of the Act, 21 U.S.C.section 360bbb-3(b)(1), unless the authorization is terminated  or revoked sooner.       Influenza A by PCR NEGATIVE NEGATIVE Final   Influenza B by PCR NEGATIVE NEGATIVE Final    Comment: (NOTE) The Xpert Xpress  SARS-CoV-2/FLU/RSV plus assay is intended as an aid in the diagnosis of influenza from Nasopharyngeal swab specimens and should not be used as a sole basis for treatment. Nasal washings and aspirates are unacceptable for Xpert Xpress SARS-CoV-2/FLU/RSV testing.  Fact Sheet for Patients: EntrepreneurPulse.com.au  Fact Sheet for Healthcare Providers: IncredibleEmployment.be  This test is not yet approved or cleared by the [place] FDA and has been authorized for detection and/or diagnosis of SARS-CoV-2 by FDA under an Emergency Use Authorization (EUA). This EUA will remain in effect (meaning this test can be used) for the duration of the COVID-19 declaration under Section 564(b)(1) of the Act, 21 U.S.C. section 360bbb-3(b)(1), unless the authorization is terminated or revoked.  Performed at [facility], [place] [address], [place], [place] [address]   Culture, blood (routine x 2)     Status: None   Collection Time: 09/05/21  8:56 PM   Specimen: BLOOD  Result Value Ref Range Status   Specimen Description   Final    BLOOD RIGHT ANTECUBITAL Performed at [facility] [address] [place], [place] [address]    Special Requests   Final    BOTTLES DRAWN AEROBIC AND ANAEROBIC Blood Culture adequate volume Performed at [facility] [address], [place], [place] [address]    Culture   Final    NO GROWTH 5 DAYS Performed at [facility], [place] [address], [place], [place] [address]    Report Status 09/11/2021 FINAL  Final  Culture, blood (routine x 2)     Status: None   Collection Time: 09/06/21  7:09 AM   Specimen: BLOOD  Result Value Ref Range Status   Specimen Description   Final    BLOOD SITE NOT SPECIFIED Performed at [facility] [address] [place], [place] [address]    Special Requests   Final    BOTTLES DRAWN AEROBIC AND ANAEROBIC Blood Culture  adequate volume Performed at [facility] [address] [place], [place] [address]    Culture   Final    NO GROWTH 5 DAYS Performed at [facility], [place] [address], [place],  [address]    Report Status 09/11/2021 FINAL  Final     Labs: Basic Metabolic Panel: Recent Labs  Lab 09/05/21 1737 09/06/21 0548 09/07/21 0520  NA 139 138 136  K 3.9 4.1 3.9  CL 109 106 103  CO2 25 27 28   GLUCOSE 94 97 101*  BUN 10 14 10   CREATININE 0.56 0.45 0.47  CALCIUM 9.0 8.8* 9.0  MG  --  2.0  --    Liver Function Tests: Recent Labs  Lab 09/05/21 1737 09/06/21 0548 09/07/21 0520  AST 18 17 23   ALT 20 17 18   ALKPHOS 65 57 60  BILITOT 0.8 0.9 0.6  PROT 7.7 6.9 7.1  ALBUMIN 4.0 3.6 3.7   No results for input(s): LIPASE, AMYLASE in the last 168 hours. No results for input(s): AMMONIA in the last 168 hours. CBC: Recent Labs  Lab 09/05/21 1737 09/06/21 0548 09/07/21 0520 09/09/21 0024  WBC 12.2* 11.4* 9.1 12.3*  NEUTROABS 5.8 6.6 4.3  --   HGB 9.1* 8.1* 8.5* 8.7*  HCT 25.8* 23.3* 24.3* 24.6*  MCV 83.8 82.6 84.4 81.7  PLT 557* 522* 549* 498*   Cardiac Enzymes: No results for input(s): CKTOTAL, CKMB, CKMBINDEX, TROPONINI in the last 168 hours. BNP: Invalid input(s): POCBNP CBG: No results for input(s): GLUCAP in the last 168 hours.  Time coordinating discharge: 30 minutes  Signed:  [name]  APRN, MSN, FNP-C [facility] [address] [place], [place] [address] [phone]  [facility] 09/11/2021, 8:42 AM

## 2021-09-17 ENCOUNTER — Other Ambulatory Visit: Payer: Self-pay | Admitting: Nurse Practitioner

## 2021-09-17 DIAGNOSIS — D571 Sickle-cell disease without crisis: Secondary | ICD-10-CM

## 2021-09-17 MED ORDER — OXYCODONE HCL 10 MG PO TABS
10.0000 mg | ORAL_TABLET | Freq: Four times a day (QID) | ORAL | 0 refills | Status: DC | PRN
Start: 2021-09-24 — End: 2021-10-07

## 2021-09-18 ENCOUNTER — Other Ambulatory Visit: Payer: Self-pay | Admitting: Nurse Practitioner

## 2021-09-18 DIAGNOSIS — D571 Sickle-cell disease without crisis: Secondary | ICD-10-CM

## 2021-09-18 MED ORDER — DULOXETINE HCL 30 MG PO CPEP: 60.0000 mg | ORAL_CAPSULE | Freq: Every day | ORAL | 11 refills | Status: DC

## 2021-09-22 ENCOUNTER — Telehealth: Payer: Self-pay

## 2021-09-22 NOTE — Telephone Encounter (Signed)
Transition Care Management Unsuccessful Follow-up Telephone Call  Date of discharge and from where:  09/09/2021  [facility]  Attempts:  1st Attempt  Reason for unsuccessful TCM follow-up call:  No answer/busy  Tomasa Rand, RN, BSN, CEN [place] Coordinator [phone]

## 2021-09-22 NOTE — Telephone Encounter (Signed)
Transition Care Management Follow-up Telephone Call Date of discharge and from where: 09/09/2021  [facility] How have you been since you were released from the hospital? "Doing better" Any questions or concerns? No  Items Reviewed: Did the pt receive and understand the discharge instructions provided? Yes  Medications obtained and verified? Yes  Other? No  Any new allergies since your discharge? No  Dietary orders reviewed? No Do you have support at home? Yes   Home Care and Equipment/Supplies: Were home health services ordered? not applicable If so, what is the name of the agency?  Has the agency set up a time to come to the patient's home? not applicable Were any new equipment or medical supplies ordered?   What is the name of the medical supply agency?  Were you able to get the supplies/equipment? not applicable Do you have any questions related to the use of the equipment or supplies? No  Functional Questionnaire: (I = Independent and D = Dependent) ADLs: I  Bathing/Dressing- I  Meal Prep- I  Eating- I  Maintaining continence- I  Transferring/Ambulation- I  Managing Meds- I  Follow up appointments reviewed:  PCP Hospital f/u appt confirmed? Yes  Scheduled to see PCP on 09/23/2021 Specialist Hospital f/u appt confirmed? No   Are transportation arrangements needed? No  If their condition worsens, is the pt aware to call PCP or go to the Emergency Dept.? Yes Was the patient provided with contact information for the PCP's office or ED? Yes Was to pt encouraged to call back with questions or concerns? Yes  Tomasa Rand, RN, BSN, CEN [facility] [employer] [phone]

## 2021-09-23 ENCOUNTER — Encounter: Payer: Self-pay | Admitting: Nurse Practitioner

## 2021-09-23 ENCOUNTER — Telehealth (INDEPENDENT_AMBULATORY_CARE_PROVIDER_SITE_OTHER): Payer: Medicaid Other | Admitting: Nurse Practitioner

## 2021-09-23 ENCOUNTER — Telehealth: Payer: Self-pay

## 2021-09-23 DIAGNOSIS — D571 Sickle-cell disease without crisis: Secondary | ICD-10-CM

## 2021-09-23 DIAGNOSIS — G894 Chronic pain syndrome: Secondary | ICD-10-CM

## 2021-09-23 NOTE — Telephone Encounter (Signed)
Prior auth initiated via [employer] for Oxycodone Confirmation #:[id number] W

## 2021-09-23 NOTE — Telephone Encounter (Signed)
Confirmation #:[id number] WBenefit Plan:MCAIDHealth Plan:[id number] Prior Approval #:[id number] [place] Type:PHARMACYRecipient:Maanya R SIMMONSRecipient ND:[id number] OBilling Provider:Billing Provider LK:[id number] Provider Name:CRYSTAL Suan Halter Provider VE:[id number] Submission Date:09/23/2021  Status:APPROVEDEffective Begin Date:11/23/2022Effective End Date:05/22/2023Payer:[place] [employer] DIV OF HEALTH BENEFITS# of Attachments:1

## 2021-09-23 NOTE — Progress Notes (Signed)
   Carmen [name], Carmen [name]  [address] Phone:  [phone]   Fax:  [phone] Virtual Visit via Video Note  I connected with Carmen [name] on 09/27/21 at  3:20 PM EST by video and verified that I am speaking with the correct person using two identifiers.   I discussed the limitations, risks, security and privacy concerns of performing an evaluation and management service by video and the availability of in person appointments. I also discussed with the patient that there may be a patient responsible charge related to this service. The patient expressed understanding and agreed to proceed.  Patient home Provider Office  History of Present Illness:  has a past medical history of Anxiety, Headache(784.0), Heart murmur, Sickle cell crisis ([place]), Syphilis (2015), and Thrombocytosis (11/22/2014).   HPI  She is following up today for medication management .  She was started on Cymbalta 30 mg for the treatment of chronic pain. She is working part-time and so she is unable to use her oxycodone or morphine during work hours. She feels like this is effective for managing her pain.  ROS    Observations/Objective: Virtual visit no exam   Assessment and Plan: 1. Chronic pain syndrome Stable with Duloxetine 30 mg BID   2. Hb-SS disease without crisis ([place]) Stable Ensure adequate hydration. Move frequently to reduce venous thromboembolism risk. Avoid situations that could lead to dehydration or could exacerbate pain Discussed S&S of infection, seizures, stroke acute chest, DVT and how important it is to seek medical attention Take medication as directed along with pain contract and overall compliance Discussed the risk related to opiate use (addition, tolerance and dependency)    Follow Up Instructions: Fu 2 mons    I discussed the assessment and treatment plan with the patient. The patient was provided an opportunity to ask questions and all were  answered. The patient agreed with the plan and demonstrated an understanding of the instructions.   The patient was advised to call back or seek an in-person evaluation if the symptoms worsen or if the condition fails to improve as anticipated.  I provided 5 minutes of video- visit time during this encounter.   Carmen Francois, NP

## 2021-09-25 ENCOUNTER — Other Ambulatory Visit: Payer: Self-pay | Admitting: Internal Medicine

## 2021-09-28 ENCOUNTER — Other Ambulatory Visit: Payer: Self-pay | Admitting: Nurse Practitioner

## 2021-09-28 DIAGNOSIS — Z79891 Long term (current) use of opiate analgesic: Secondary | ICD-10-CM

## 2021-09-28 DIAGNOSIS — D571 Sickle-cell disease without crisis: Secondary | ICD-10-CM

## 2021-09-28 MED ORDER — MORPHINE SULFATE ER 60 MG PO TBCR: 60.0000 mg | EXTENDED_RELEASE_TABLET | Freq: Two times a day (BID) | ORAL | 0 refills | Status: DC

## 2021-10-07 ENCOUNTER — Other Ambulatory Visit: Payer: Self-pay | Admitting: Nurse Practitioner

## 2021-10-07 MED ORDER — OXYCODONE HCL 10 MG PO TABS: 10.0000 mg | ORAL_TABLET | Freq: Four times a day (QID) | ORAL | 0 refills | Status: DC | PRN

## 2021-10-10 ENCOUNTER — Other Ambulatory Visit: Payer: Self-pay | Admitting: Nurse Practitioner

## 2021-10-10 DIAGNOSIS — G8929 Other chronic pain: Secondary | ICD-10-CM

## 2021-10-10 DIAGNOSIS — M545 Low back pain, unspecified: Secondary | ICD-10-CM

## 2021-10-17 ENCOUNTER — Other Ambulatory Visit: Payer: Self-pay | Admitting: Nurse Practitioner

## 2021-10-17 DIAGNOSIS — D571 Sickle-cell disease without crisis: Secondary | ICD-10-CM

## 2021-10-19 ENCOUNTER — Non-Acute Institutional Stay (HOSPITAL_BASED_OUTPATIENT_CLINIC_OR_DEPARTMENT_OTHER)
Admission: AD | Admit: 2021-10-19 | Discharge: 2021-10-19 | Disposition: A | Discharge status: Home / Self Care | Payer: Medicaid Other | Source: Ambulatory Visit | Attending: Internal Medicine | Admitting: Internal Medicine

## 2021-10-19 ENCOUNTER — Telehealth (HOSPITAL_COMMUNITY): Payer: Self-pay | Admitting: General Practice

## 2021-10-19 ENCOUNTER — Encounter (HOSPITAL_COMMUNITY): Payer: Self-pay | Admitting: Internal Medicine

## 2021-10-19 DIAGNOSIS — D5700 Hb-SS disease with crisis, unspecified: Secondary | ICD-10-CM | POA: Diagnosis not present

## 2021-10-19 DIAGNOSIS — G894 Chronic pain syndrome: Secondary | ICD-10-CM | POA: Insufficient documentation

## 2021-10-19 DIAGNOSIS — Z79899 Other long term (current) drug therapy: Secondary | ICD-10-CM | POA: Insufficient documentation

## 2021-10-19 DIAGNOSIS — F1120 Opioid dependence, uncomplicated: Secondary | ICD-10-CM | POA: Insufficient documentation

## 2021-10-19 LAB — CBC WITH DIFFERENTIAL/PLATELET
Abs Immature Granulocytes: 0.02 10*3/uL (ref 0.00–0.07)
Basophils Absolute: 0 10*3/uL (ref 0.0–0.1)
Basophils Relative: 0 %
Eosinophils Absolute: 0.2 10*3/uL (ref 0.0–0.5)
Eosinophils Relative: 3 %
HCT: 29.5 % — ABNORMAL LOW (ref 36.0–46.0)
Hemoglobin: 10.3 g/dL — ABNORMAL LOW (ref 12.0–15.0)
Immature Granulocytes: 0 %
Lymphocytes Relative: 34 %
Lymphs Abs: 2.3 10*3/uL (ref 0.7–4.0)
MCH: 29.9 pg (ref 26.0–34.0)
MCHC: 34.9 g/dL (ref 30.0–36.0)
MCV: 85.5 fL (ref 80.0–100.0)
Monocytes Absolute: 0.8 10*3/uL (ref 0.1–1.0)
Monocytes Relative: 12 %
Neutro Abs: 3.5 10*3/uL (ref 1.7–7.7)
Neutrophils Relative %: 51 %
Platelets: 586 10*3/uL — ABNORMAL HIGH (ref 150–400)
RBC: 3.45 MIL/uL — ABNORMAL LOW (ref 3.87–5.11)
RDW: 14.8 % (ref 11.5–15.5)
WBC: 6.9 10*3/uL (ref 4.0–10.5)
nRBC: 0.4 % — ABNORMAL HIGH (ref 0.0–0.2)

## 2021-10-19 LAB — RETICULOCYTES
Immature Retic Fract: 32.7 % — ABNORMAL HIGH (ref 2.3–15.9)
RBC.: 3.42 MIL/uL — ABNORMAL LOW (ref 3.87–5.11)
Retic Count, Absolute: 142.3 10*3/uL (ref 19.0–186.0)
Retic Ct Pct: 4.2 % — ABNORMAL HIGH (ref 0.4–3.1)

## 2021-10-19 LAB — URINALYSIS, ROUTINE W REFLEX MICROSCOPIC
Bilirubin Urine: NEGATIVE
Glucose, UA: NEGATIVE mg/dL
Hgb urine dipstick: NEGATIVE
Ketones, ur: NEGATIVE mg/dL
Leukocytes,Ua: NEGATIVE
Nitrite: NEGATIVE
Protein, ur: NEGATIVE mg/dL
Specific Gravity, Urine: 1.005 (ref 1.005–1.030)
pH: 6 (ref 5.0–8.0)

## 2021-10-19 MED ORDER — SODIUM CHLORIDE 0.9 % IV SOLN
25.0000 mg | INTRAVENOUS | Status: DC | PRN
  Filled 2021-10-19: qty 0.5

## 2021-10-19 MED ORDER — DIPHENHYDRAMINE HCL 25 MG PO CAPS
25.0000 mg | ORAL_CAPSULE | ORAL | Status: DC | PRN
  Administered 2021-10-19: 10:00:00 25 mg via ORAL
  Filled 2021-10-19: qty 1

## 2021-10-19 MED ORDER — SENNOSIDES-DOCUSATE SODIUM 8.6-50 MG PO TABS
1.0000 | ORAL_TABLET | Freq: Two times a day (BID) | ORAL | Status: DC
  Filled 2021-10-19: qty 1

## 2021-10-19 MED ORDER — HYDROMORPHONE 1 MG/ML IV SOLN
INTRAVENOUS | Status: DC
  Administered 2021-10-19: 30 mg via INTRAVENOUS
  Administered 2021-10-19: 8 mg via INTRAVENOUS
  Filled 2021-10-19: qty 30

## 2021-10-19 MED ORDER — POLYETHYLENE GLYCOL 3350 17 G PO PACK: 17.0000 g | PACK | Freq: Every day | ORAL | Status: DC | PRN

## 2021-10-19 MED ORDER — ONDANSETRON HCL 4 MG/2ML IJ SOLN: 4.0000 mg | Freq: Four times a day (QID) | INTRAMUSCULAR | Status: DC | PRN

## 2021-10-19 MED ORDER — NALOXONE HCL 0.4 MG/ML IJ SOLN: 0.4000 mg | INTRAMUSCULAR | Status: DC | PRN

## 2021-10-19 MED ORDER — SODIUM CHLORIDE 0.45 % IV SOLN: INTRAVENOUS | Status: DC

## 2021-10-19 MED ORDER — SODIUM CHLORIDE 0.9% FLUSH: 9.0000 mL | INTRAVENOUS | Status: DC | PRN

## 2021-10-19 MED ORDER — KETOROLAC TROMETHAMINE 15 MG/ML IJ SOLN
15.0000 mg | Freq: Once | INTRAMUSCULAR | Status: AC
  Administered 2021-10-19: 12:00:00 15 mg via INTRAVENOUS
  Filled 2021-10-19: qty 1

## 2021-10-19 NOTE — Telephone Encounter (Signed)
Patient called, requesting to come to the day hospital due to pain in the  back, legs and arms  rated at 10/10. Denied chest pain, fever, diarrhea, abdominal pain, nausea/vomitting. Screened negative for Covid-19 symptoms. Admitted to having means of transportation without driving self after treatment. Last took  60 mg of morphine at 21:00 yesterday. Per provider, patient can come to the day hospital for treatment. Patient notified, verbalized understanding.

## 2021-10-19 NOTE — H&P (Signed)
[facility] History and Physical  Carmen [name]:[id number] DOB: [birth year] DOA: 10/19/2021  PCP: Vevelyn Francois, NP   Chief Complaint: Sickle cell pain   HPI: Carmen [name] is a 30 y.o. female with history of sickle cell disease, chronic pain syndrome, opiate dependence and tolerance, major depression and history of anemia of chronic disease who presented to the day hospital this morning with major complaints of generalized body pain over the past few days that is consistent with her typical sickle cell pain crisis.  She could not remember any inciting factor but most likely due to the change in weather.  She rates her pain at 10/10 this morning characterized as throbbing and achy.  No joint swelling or redness.  She took her home pain medications this morning with no sustained relief.  She denies any headache, shortness of breath, cough, dizziness, nausea, vomiting or diarrhea.  No sick contacts, no recent travels or known exposure to COVID-19.  Systemic Review: General: The patient denies anorexia, fever, weight loss Cardiac: Denies chest pain, syncope, palpitations, pedal edema  Respiratory: Denies cough, shortness of breath, wheezing GI: Denies severe indigestion/heartburn, abdominal pain, nausea, vomiting, diarrhea and constipation GU: Denies hematuria, incontinence, dysuria  Musculoskeletal: Denies arthritis  Skin: Denies suspicious skin lesions Neurologic: Denies focal weakness or numbness, change in vision  Past Medical History:  Diagnosis Date   Anxiety    Headache(784.0)    Heart murmur    Sickle cell crisis ([place])    Syphilis 2015   Was diagnosed and received one injection of antibiotics   Thrombocytosis 11/22/2014     CBC Latest Ref Rng & Units 12/13/2018 12/11/2018 12/10/2018 WBC 4.0 - 10.5 K/uL 14.8(H) 13.2(H) 15.9(H) Hemoglobin 12.0 - 15.0 g/dL 8.2(L) 7.7(L) 8.1(L) Hematocrit 36.0 - 46.0 % 23.7(L) 23.2(L) 24.5(L) Platelets 150 - 400 K/uL [phone]       Past Surgical History:  Procedure Laterality Date   CESAREAN SECTION N/A 02/05/2019   Procedure: CESAREAN SECTION;  Surgeon: [name], MD;  Location: [facility];  Service: Obstetrics;  Laterality: N/A;   CHOLECYSTECTOMY N/A 11/30/2014   Procedure: LAPAROSCOPIC CHOLECYSTECTOMY SINGLE SITE WITH INTRAOPERATIVE CHOLANGIOGRAM;  Surgeon: Michael Boston, MD;  Location: [facility] ORS;  Service: General;  Laterality: N/A;   SPLENECTOMY      Allergies  Allergen Reactions   Ketamine Hives and Other (See Comments)    Makes feel funny    Family History  Problem Relation Age of Onset   Hypertension Mother    Sickle cell anemia Sister    Kidney disease Sister        Lupus   Arthritis Sister    Sickle cell anemia Sister    Sickle cell trait Sister    Heart disease Maternal Aunt        CABG   Heart disease Maternal Uncle        CABG   Lupus Sister       Prior to Admission medications   Medication Sig Start Date End Date Taking? Authorizing Provider  citalopram (CELEXA) 20 MG tablet TAKE 1 TABLET BY MOUTH EVERY DAY Patient taking differently: Take 20 mg by mouth daily as needed (anxiety). 02/24/21   [name], FNP  cyclobenzaprine (FLEXERIL) 10 MG tablet TAKE 1 TABLET BY MOUTH TWICE A DAY 10/12/21   Vevelyn Francois, NP  DULoxetine (CYMBALTA) 30 MG capsule Take 2 capsules (60 mg total) by mouth daily. 09/18/21 09/18/22  Vevelyn Francois, NP  folic acid (FOLVITE) 1 MG tablet Take 1 tablet (1 mg total) by mouth daily. 03/15/19   Lanae Boast, FNP  morphine (MS CONTIN) 60 MG 12 hr tablet Take 1 tablet (60 mg total) by mouth every 12 (twelve) hours. 09/28/21 10/28/21  Vevelyn Francois, NP  Oxycodone HCl 10 MG TABS Take 1 tablet (10 mg total) by mouth every 6 (six) hours as needed for up to 15 days (pain). 10/10/21 10/25/21  Vevelyn Francois, NP  Prenatal Vit-Fe Fumarate-FA (PRENATAL VITAMIN PLUS LOW IRON) 27-1 MG TABS TAKE 1 TABLET BY MOUTH EVERY DAY Patient taking differently: Take 1 tablet  by mouth daily. 03/03/20   Shelly Bombard, MD  Vitamin D, Ergocalciferol, (DRISDOL) 1.25 MG (50000 UNIT) CAPS capsule TAKE ONE CAPSULE BY MOUTH ONE TIME PER WEEK Patient taking differently: Take 50,000 Units by mouth every Wednesday. 09/03/20   Vevelyn Francois, NP     Physical Exam: There were no vitals filed for this visit.  General: Alert, awake, afebrile, anicteric, not in obvious distress HEENT: Normocephalic and Atraumatic, Mucous membranes pink                PERRLA; EOM intact; No scleral icterus,                 Nares: Patent, Oropharynx: Clear, Fair Dentition                 Neck: FROM, no cervical lymphadenopathy, thyromegaly, carotid bruit or JVD;  CHEST WALL: No tenderness  CHEST: Normal respiration, clear to auscultation bilaterally  HEART: Regular rate and rhythm; no murmurs rubs or gallops  BACK: No kyphosis or scoliosis; no CVA tenderness  ABDOMEN: Positive Bowel Sounds, soft, non-tender; no masses, no organomegaly EXTREMITIES: No cyanosis, clubbing, or edema SKIN:  no rash or ulceration  CNS: Alert and Oriented x 4, Nonfocal exam, CN 2-12 intact  Labs on Admission:  Basic Metabolic Panel: No results for input(s): NA, K, CL, CO2, GLUCOSE, BUN, CREATININE, CALCIUM, MG, PHOS in the last 168 hours. Liver Function Tests: No results for input(s): AST, ALT, ALKPHOS, BILITOT, PROT, ALBUMIN in the last 168 hours. No results for input(s): LIPASE, AMYLASE in the last 168 hours. No results for input(s): AMMONIA in the last 168 hours. CBC: No results for input(s): WBC, NEUTROABS, HGB, HCT, MCV, PLT in the last 168 hours. Cardiac Enzymes: No results for input(s): CKTOTAL, CKMB, CKMBINDEX, TROPONINI in the last 168 hours.  BNP (last 3 results) No results for input(s): BNP in the last 8760 hours.  ProBNP (last 3 results) No results for input(s): PROBNP in the last 8760 hours.  CBG: No results for input(s): GLUCAP in the last 168 hours.   Assessment/Plan Active  Problems:   Sickle cell anemia with crisis ([place])  Admits to the Day Hospital for extended observation IVF 0.45% Saline @ 125 mls/hour Weight based Dilaudid PCA started within 30 minutes of admission IV Toradol 15 mg x 1 dose Acetaminophen 1000 mg x 1 dose Labs: CBCD, CMP, Retic Count and LDH Monitor vitals very closely, Re-evaluate pain scale every hour 2 L of Oxygen by [place] Patient will be re-evaluated for pain in the context of function and relationship to baseline as care progresses. If no significant relieve from pain (remains above 5/10) will transfer patient to inpatient services for further evaluation and management  Code Status: Full  Family Communication: None  DVT Prophylaxis: Ambulate as tolerated   Time spent: 79 Minutes  Angelica Chessman, MD, [place], [place],  FAAP, CPE  If 7PM-7AM, please contact night-coverage [email or web address] 10/19/2021, 9:43 AM

## 2021-10-19 NOTE — Discharge Summary (Signed)
Physician Discharge Summary  KANIAH RIZZOLO TIW:[id number] DOB: 1991-08-25 DOA: 10/19/2021  PCP: Vevelyn Francois, NP  Admit date: 10/19/2021  Discharge date: 10/19/2021  Time spent: 30 minutes  Discharge Diagnoses:  Active Problems:   Sickle cell anemia with crisis [facility])   Discharge Condition: Stable  Diet recommendation: Regular  History of present illness:  Carmen [name] is a 30 y.o. female with history of sickle cell disease, chronic pain syndrome, opiate dependence and tolerance, major depression and history of anemia of chronic disease who presented to the day hospital this morning with major complaints of generalized body pain over the past few days that is consistent with her typical sickle cell pain crisis.  She could not remember any inciting factor but most likely due to the change in weather.  She rates her pain at 10/10 this morning characterized as throbbing and achy.  No joint swelling or redness.  She took her home pain medications this morning with no sustained relief.  She denies any headache, shortness of breath, cough, dizziness, nausea, vomiting or diarrhea.  No sick contacts, no recent travels or known exposure to COVID-19.  Hospital Course:  RUDI KNIPPENBERG was admitted to the day hospital with sickle cell painful crisis. Patient was treated with IV fluid, weight based IV Dilaudid PCA, IV Toradol, clinician assisted doses as deemed appropriate, and other adjunct therapies per sickle cell pain management protocol. Clydine showed significant improvement symptomatically, pain improved from 10/10 to 7/10 at the time of discharge. Patient was discharged home in a hemodynamically stable condition. Kelleigh will follow-up at the clinic as previously scheduled, continue with home medications as per prior to admission.  Patient was advised to return to the day hospital tomorrow for another day of treatment if symptoms persist.  Discharge Instructions We discussed the need for good  hydration, monitoring of hydration status, avoidance of heat, cold, stress, and infection triggers. We discussed the need to be compliant with taking Hydrea and other home medications. Rhegan was reminded of the need to seek medical attention immediately if any symptom of bleeding, anemia, or infection occurs.  Discharge Exam: Vitals:   10/19/21 1354 10/19/21 1356  BP: 105/72   Pulse: 67   Resp: 12 15  Temp:    SpO2: 98%     General appearance: alert, cooperative and no distress Eyes: conjunctivae/corneas clear. PERRL, EOM's intact. Fundi benign. Neck: no adenopathy, no carotid bruit, no JVD, supple, symmetrical, trachea midline and thyroid not enlarged, symmetric, no tenderness/mass/nodules Back: symmetric, no curvature. ROM normal. No CVA tenderness. Resp: clear to auscultation bilaterally Chest wall: no tenderness Cardio: regular rate and rhythm, S1, S2 normal, no murmur, click, rub or gallop GI: soft, non-tender; bowel sounds normal; no masses,  no organomegaly Extremities: extremities normal, atraumatic, no cyanosis or edema Pulses: 2+ and symmetric Skin: Skin color, texture, turgor normal. No rashes or lesions Neurologic: Grossly normal  Discharge Instructions     Diet - low sodium heart healthy   Complete by: As directed    Increase activity slowly   Complete by: As directed       Allergies as of 10/19/2021       Reactions   Ketamine Hives, Other (See Comments)   Makes feel funny        Medication List     TAKE these medications    citalopram 20 MG tablet Commonly known as: CELEXA TAKE 1 TABLET BY MOUTH EVERY DAY What changed:  when to take this reasons to  take this   cyclobenzaprine 10 MG tablet Commonly known as: FLEXERIL TAKE 1 TABLET BY MOUTH TWICE A DAY   DULoxetine 30 MG capsule Commonly known as: CYMBALTA Take 2 capsules (60 mg total) by mouth daily.   folic acid 1 MG tablet Commonly known as: FOLVITE Take 1 tablet (1 mg total) by mouth  daily.   morphine 60 MG 12 hr tablet Commonly known as: MS Contin Take 1 tablet (60 mg total) by mouth every 12 (twelve) hours.   Oxycodone HCl 10 MG Tabs Take 1 tablet (10 mg total) by mouth every 6 (six) hours as needed for up to 15 days (pain).   Prenatal Vitamin Plus Low Iron 27-1 MG Tabs TAKE 1 TABLET BY MOUTH EVERY DAY   Vitamin D (Ergocalciferol) 1.25 MG (50000 UNIT) Caps capsule Commonly known as: DRISDOL TAKE ONE CAPSULE BY MOUTH ONE TIME PER WEEK What changed:  how much to take how to take this when to take this additional instructions       Allergies  Allergen Reactions   Ketamine Hives and Other (See Comments)    Makes feel funny     Significant Diagnostic Studies: No results found.  Signed:  Angelica Chessman MD, Fernando Salinas, [place], Julio Sicks, CPE   10/19/2021, 2:04 PM

## 2021-10-19 NOTE — Progress Notes (Signed)
Patient admitted to the day hospital for treatment of sickle cell pain crisis. Patient reported pain rated 10/10 in the back, legs and arms. Patient placed on Dilaudid PCA, given IV Toradol, PO benadryl and hydrated with IV fluids. At discharge patient reported  pain at 9, 8 and 6/10. respectively. Declined printed AVS. Alert, oriented and ambulatory at discharge. Patient will call back in the morning for triage.

## 2021-10-20 ENCOUNTER — Inpatient Hospital Stay (HOSPITAL_COMMUNITY)
Admission: EM | Admit: 2021-10-20 | Discharge: 2021-10-23 | DRG: 812 | Disposition: A | Discharge status: Home / Self Care | Payer: Medicaid Other | Attending: Internal Medicine | Admitting: Internal Medicine

## 2021-10-20 ENCOUNTER — Encounter (HOSPITAL_COMMUNITY): Payer: Self-pay | Admitting: Emergency Medicine

## 2021-10-20 ENCOUNTER — Other Ambulatory Visit: Payer: Self-pay

## 2021-10-20 DIAGNOSIS — Z79891 Long term (current) use of opiate analgesic: Secondary | ICD-10-CM

## 2021-10-20 DIAGNOSIS — Z888 Allergy status to other drugs, medicaments and biological substances status: Secondary | ICD-10-CM

## 2021-10-20 DIAGNOSIS — Z832 Family history of diseases of the blood and blood-forming organs and certain disorders involving the immune mechanism: Secondary | ICD-10-CM | POA: Diagnosis not present

## 2021-10-20 DIAGNOSIS — F17210 Nicotine dependence, cigarettes, uncomplicated: Secondary | ICD-10-CM | POA: Diagnosis present

## 2021-10-20 DIAGNOSIS — Z8261 Family history of arthritis: Secondary | ICD-10-CM | POA: Diagnosis not present

## 2021-10-20 DIAGNOSIS — F331 Major depressive disorder, recurrent, moderate: Secondary | ICD-10-CM | POA: Diagnosis present

## 2021-10-20 DIAGNOSIS — D75838 Other thrombocytosis: Secondary | ICD-10-CM | POA: Diagnosis present

## 2021-10-20 DIAGNOSIS — D5700 Hb-SS disease with crisis, unspecified: Principal | ICD-10-CM | POA: Diagnosis present

## 2021-10-20 DIAGNOSIS — Z8249 Family history of ischemic heart disease and other diseases of the circulatory system: Secondary | ICD-10-CM | POA: Diagnosis not present

## 2021-10-20 DIAGNOSIS — G894 Chronic pain syndrome: Secondary | ICD-10-CM | POA: Diagnosis present

## 2021-10-20 DIAGNOSIS — Z841 Family history of disorders of kidney and ureter: Secondary | ICD-10-CM

## 2021-10-20 DIAGNOSIS — Z79899 Other long term (current) drug therapy: Secondary | ICD-10-CM

## 2021-10-20 DIAGNOSIS — D638 Anemia in other chronic diseases classified elsewhere: Secondary | ICD-10-CM | POA: Diagnosis present

## 2021-10-20 DIAGNOSIS — F322 Major depressive disorder, single episode, severe without psychotic features: Secondary | ICD-10-CM | POA: Diagnosis present

## 2021-10-20 LAB — COMPREHENSIVE METABOLIC PANEL
ALT: 19 U/L (ref 0–44)
AST: 21 U/L (ref 15–41)
Albumin: 4 g/dL (ref 3.5–5.0)
Alkaline Phosphatase: 56 U/L (ref 38–126)
Anion gap: 4 — ABNORMAL LOW (ref 5–15)
BUN: 11 mg/dL (ref 6–20)
CO2: 26 mmol/L (ref 22–32)
Calcium: 8.9 mg/dL (ref 8.9–10.3)
Chloride: 109 mmol/L (ref 98–111)
Creatinine, Ser: 0.69 mg/dL (ref 0.44–1.00)
GFR, Estimated: >60 mL/min (ref >60)
Glucose, Bld: 99 mg/dL (ref 70–99)
Potassium: 4.2 mmol/L (ref 3.5–5.1)
Sodium: 139 mmol/L (ref 135–145)
Total Bilirubin: 0.9 mg/dL (ref 0.3–1.2)
Total Protein: 7.7 g/dL (ref 6.5–8.1)

## 2021-10-20 LAB — CBC WITH DIFFERENTIAL/PLATELET
Abs Immature Granulocytes: 0.04 10*3/uL (ref 0.00–0.07)
Basophils Absolute: 0 10*3/uL (ref 0.0–0.1)
Basophils Relative: 1 %
Eosinophils Absolute: 0.2 10*3/uL (ref 0.0–0.5)
Eosinophils Relative: 3 %
HCT: 28.3 % — ABNORMAL LOW (ref 36.0–46.0)
Hemoglobin: 10.2 g/dL — ABNORMAL LOW (ref 12.0–15.0)
Immature Granulocytes: 1 %
Lymphocytes Relative: 35 %
Lymphs Abs: 2.7 10*3/uL (ref 0.7–4.0)
MCH: 30.4 pg (ref 26.0–34.0)
MCHC: 36 g/dL (ref 30.0–36.0)
MCV: 84.2 fL (ref 80.0–100.0)
Monocytes Absolute: 0.9 10*3/uL (ref 0.1–1.0)
Monocytes Relative: 11 %
Neutro Abs: 3.9 10*3/uL (ref 1.7–7.7)
Neutrophils Relative %: 49 %
Platelets: 586 10*3/uL — ABNORMAL HIGH (ref 150–400)
RBC: 3.36 MIL/uL — ABNORMAL LOW (ref 3.87–5.11)
RDW: 14.4 % (ref 11.5–15.5)
WBC: 7.7 10*3/uL (ref 4.0–10.5)
nRBC: 0.3 % — ABNORMAL HIGH (ref 0.0–0.2)

## 2021-10-20 LAB — RETICULOCYTES
Immature Retic Fract: 40.4 % — ABNORMAL HIGH (ref 2.3–15.9)
RBC.: 3.44 MIL/uL — ABNORMAL LOW (ref 3.87–5.11)
Retic Count, Absolute: 141.4 10*3/uL (ref 19.0–186.0)
Retic Ct Pct: 4.1 % — ABNORMAL HIGH (ref 0.4–3.1)

## 2021-10-20 LAB — I-STAT BETA HCG BLOOD, ED (MC, WL, AP ONLY): I-stat hCG, quantitative: <5 m[IU]/mL (ref <5)

## 2021-10-20 MED ORDER — SODIUM CHLORIDE 0.45 % IV SOLN: INTRAVENOUS | Status: DC

## 2021-10-20 MED ORDER — KETOROLAC TROMETHAMINE 30 MG/ML IJ SOLN
30.0000 mg | Freq: Four times a day (QID) | INTRAMUSCULAR | Status: DC
  Administered 2021-10-20 – 2021-10-23 (×11): 30 mg via INTRAVENOUS
  Filled 2021-10-20 (×11): qty 1

## 2021-10-20 MED ORDER — SODIUM CHLORIDE 0.9% FLUSH: 10.0000 mL | INTRAVENOUS | Status: DC | PRN

## 2021-10-20 MED ORDER — ONDANSETRON HCL 4 MG/2ML IJ SOLN: 4.0000 mg | INTRAMUSCULAR | Status: DC | PRN

## 2021-10-20 MED ORDER — SENNOSIDES-DOCUSATE SODIUM 8.6-50 MG PO TABS
1.0000 | ORAL_TABLET | Freq: Two times a day (BID) | ORAL | Status: DC
  Administered 2021-10-21 – 2021-10-23 (×3): 1 via ORAL
  Filled 2021-10-20 (×5): qty 1

## 2021-10-20 MED ORDER — MORPHINE SULFATE ER 15 MG PO TBCR
60.0000 mg | EXTENDED_RELEASE_TABLET | Freq: Two times a day (BID) | ORAL | Status: DC
  Administered 2021-10-20 – 2021-10-23 (×6): 60 mg via ORAL
  Filled 2021-10-20 (×6): qty 4

## 2021-10-20 MED ORDER — PRENATAL VITAMIN PLUS LOW IRON 27-1 MG PO TABS: 1.0000 | ORAL_TABLET | Freq: Every day | ORAL | Status: DC

## 2021-10-20 MED ORDER — DULOXETINE HCL 60 MG PO CPEP
60.0000 mg | ORAL_CAPSULE | Freq: Every day | ORAL | Status: DC
  Administered 2021-10-20 – 2021-10-23 (×4): 60 mg via ORAL
  Filled 2021-10-20 (×4): qty 1

## 2021-10-20 MED ORDER — DIPHENHYDRAMINE HCL 25 MG PO CAPS: 25.0000 mg | ORAL_CAPSULE | ORAL | Status: DC | PRN

## 2021-10-20 MED ORDER — SODIUM CHLORIDE 0.9 % IV SOLN
25.0000 mg | INTRAVENOUS | Status: DC | PRN
  Filled 2021-10-20: qty 0.5

## 2021-10-20 MED ORDER — HYDROMORPHONE HCL 2 MG/ML IJ SOLN
2.0000 mg | INTRAMUSCULAR | Status: DC | PRN
  Administered 2021-10-20 – 2021-10-23 (×5): 2 mg via INTRAVENOUS
  Filled 2021-10-20 (×5): qty 1

## 2021-10-20 MED ORDER — HYDROMORPHONE 1 MG/ML IV SOLN
INTRAVENOUS | Status: DC
  Administered 2021-10-20: 30 mg via INTRAVENOUS
  Administered 2021-10-20: 6.5 mg via INTRAVENOUS
  Administered 2021-10-21: 30 mg via INTRAVENOUS
  Administered 2021-10-21: 0.5 mg via INTRAVENOUS
  Administered 2021-10-21: 1 mg via INTRAVENOUS
  Administered 2021-10-21: 6.5 mg via INTRAVENOUS
  Administered 2021-10-21: 3 mg via INTRAVENOUS
  Administered 2021-10-21: 3.5 mg via INTRAVENOUS
  Administered 2021-10-21: 6 mg via INTRAVENOUS
  Administered 2021-10-21: 3 mg via INTRAVENOUS
  Administered 2021-10-22: 7 mg via INTRAVENOUS
  Administered 2021-10-22: 30 mg via INTRAVENOUS
  Administered 2021-10-22: 3.5 mg via INTRAVENOUS
  Administered 2021-10-22: 9.5 mg via INTRAVENOUS
  Administered 2021-10-22: 2 mg via INTRAVENOUS
  Administered 2021-10-22: 5 mg via INTRAVENOUS
  Administered 2021-10-22: 4.5 mg via INTRAVENOUS
  Administered 2021-10-23: 6 mg via INTRAVENOUS
  Administered 2021-10-23: 6.5 mg via INTRAVENOUS
  Administered 2021-10-23: 10 mg via INTRAVENOUS
  Filled 2021-10-20 (×3): qty 30

## 2021-10-20 MED ORDER — POLYETHYLENE GLYCOL 3350 17 G PO PACK: 17.0000 g | PACK | Freq: Every day | ORAL | Status: DC | PRN

## 2021-10-20 MED ORDER — SODIUM CHLORIDE 0.9% FLUSH: 9.0000 mL | INTRAVENOUS | Status: DC | PRN

## 2021-10-20 MED ORDER — NALOXONE HCL 0.4 MG/ML IJ SOLN: 0.4000 mg | INTRAMUSCULAR | Status: DC | PRN

## 2021-10-20 MED ORDER — PRENATAL MULTIVITAMIN CH
1.0000 | ORAL_TABLET | Freq: Every day | ORAL | Status: DC
Start: 2021-10-21 — End: 2021-10-23
  Administered 2021-10-21 – 2021-10-23 (×3): 1 via ORAL
  Filled 2021-10-20 (×3): qty 1

## 2021-10-20 MED ORDER — SODIUM CHLORIDE 0.9% FLUSH
10.0000 mL | Freq: Two times a day (BID) | INTRAVENOUS | Status: DC
  Administered 2021-10-21 – 2021-10-23 (×3): 10 mL

## 2021-10-20 MED ORDER — HYDROMORPHONE HCL 2 MG/ML IJ SOLN
2.0000 mg | INTRAMUSCULAR | Status: AC
  Administered 2021-10-20: 12:00:00 2 mg via INTRAVENOUS
  Filled 2021-10-20: qty 1

## 2021-10-20 MED ORDER — ENOXAPARIN SODIUM 40 MG/0.4ML IJ SOSY
40.0000 mg | PREFILLED_SYRINGE | INTRAMUSCULAR | Status: DC
  Filled 2021-10-20: qty 0.4

## 2021-10-20 MED ORDER — CITALOPRAM HYDROBROMIDE 20 MG PO TABS
20.0000 mg | ORAL_TABLET | Freq: Every day | ORAL | Status: DC | PRN
  Administered 2021-10-20: 21:00:00 20 mg via ORAL
  Filled 2021-10-20: qty 1

## 2021-10-20 MED ORDER — ONDANSETRON HCL 4 MG/2ML IJ SOLN: 4.0000 mg | Freq: Four times a day (QID) | INTRAMUSCULAR | Status: DC | PRN

## 2021-10-20 MED ORDER — HYDROMORPHONE HCL 2 MG/ML IJ SOLN
2.0000 mg | INTRAMUSCULAR | Status: AC
  Administered 2021-10-20: 11:00:00 2 mg via INTRAVENOUS
  Filled 2021-10-20: qty 1

## 2021-10-20 MED ORDER — DEXTROSE-NACL 5-0.45 % IV SOLN: INTRAVENOUS | Status: DC

## 2021-10-20 MED ORDER — TRIAMCINOLONE ACETONIDE 0.1 % EX CREA
TOPICAL_CREAM | Freq: Two times a day (BID) | CUTANEOUS | Status: DC
  Filled 2021-10-20: qty 15

## 2021-10-20 MED ORDER — CYCLOBENZAPRINE HCL 10 MG PO TABS
10.0000 mg | ORAL_TABLET | Freq: Two times a day (BID) | ORAL | Status: DC | PRN
  Administered 2021-10-20: 21:00:00 10 mg via ORAL
  Filled 2021-10-20: qty 1

## 2021-10-20 MED ORDER — VITAMIN D (ERGOCALCIFEROL) 1.25 MG (50000 UNIT) PO CAPS
50000.0000 [IU] | ORAL_CAPSULE | ORAL | Status: DC
  Administered 2021-10-21: 10:00:00 50000 [IU] via ORAL
  Filled 2021-10-20: qty 1

## 2021-10-20 MED ORDER — FOLIC ACID 1 MG PO TABS
1.0000 mg | ORAL_TABLET | Freq: Every day | ORAL | Status: DC
  Administered 2021-10-20 – 2021-10-23 (×4): 1 mg via ORAL
  Filled 2021-10-20 (×4): qty 1

## 2021-10-20 NOTE — H&P (Signed)
Carmen [name] is an 30 y.o. female.   Chief Complaint: Generalized body aches  HPI: Patient with history of sickle cell disease, mood disorder, chronic anemia here complaining of sickle cell painful crisis.  Patient is complaining of 10 out of 10 pain which she blames on the cold weather.  She believes her pain was triggered after the weather change.  Pain in the lower back radiating down her legs.  She has taken her home regimen but not working.  The nature of the pain is consistent with her typical sickle cell crisis.  No fever or chills no nausea vomiting or diarrhea.  She received treatment in the ER including 3 doses of Dilaudid IV but no relief.  Patient being admitted to the hospital for continued pain management and sickle cell crisis management.  Past Medical History:  Diagnosis Date   Anxiety    Headache(784.0)    Heart murmur    Sickle cell crisis ([place])    Syphilis 2015   Was diagnosed and received one injection of antibiotics   Thrombocytosis 11/22/2014     CBC Latest Ref Rng & Units 12/13/2018 12/11/2018 12/10/2018 WBC 4.0 - 10.5 K/uL 14.8(H) 13.2(H) 15.9(H) Hemoglobin 12.0 - 15.0 g/dL 8.2(L) 7.7(L) 8.1(L) Hematocrit 36.0 - 46.0 % 23.7(L) 23.2(L) 24.5(L) Platelets 150 - 400 K/uL 326 399 388      Past Surgical History:  Procedure Laterality Date   CESAREAN SECTION N/A 02/05/2019   Procedure: CESAREAN SECTION;  Surgeon: [name], MD;  Location: [facility]S;  Service: Obstetrics;  Laterality: N/A;   CHOLECYSTECTOMY N/A 11/30/2014   Procedure: LAPAROSCOPIC CHOLECYSTECTOMY SINGLE SITE WITH INTRAOPERATIVE CHOLANGIOGRAM;  Surgeon: Michael Boston, MD;  Location: [facility] ORS;  Service: General;  Laterality: N/A;   SPLENECTOMY      Family History  Problem Relation Age of Onset   Hypertension Mother    Sickle cell anemia Sister    Kidney disease Sister        Lupus   Arthritis Sister    Sickle cell anemia Sister    Sickle cell trait Sister    Heart disease Maternal Aunt        CABG    Heart disease Maternal Uncle        CABG   Lupus Sister    Social History:  reports that she has been smoking cigarettes. She has never used smokeless tobacco. She reports that she does not drink alcohol and does not use drugs.  Allergies:  Allergies  Allergen Reactions   Ketamine Hives and Other (See Comments)    Makes feel funny    (Not in a hospital admission)   Results for orders placed or performed during the hospital encounter of 10/20/21 (from the past 48 hour(s))  CBC with Differential/Platelet     Status: Abnormal   Collection Time: 10/20/21 10:14 AM  Result Value Ref Range   WBC 7.7 4.0 - 10.5 K/uL   RBC 3.36 (L) 3.87 - 5.11 MIL/uL   Hemoglobin 10.2 (L) 12.0 - 15.0 g/dL   HCT 28.3 (L) 36.0 - 46.0 %   MCV 84.2 80.0 - 100.0 fL   MCH 30.4 26.0 - 34.0 pg   MCHC 36.0 30.0 - 36.0 g/dL   RDW 14.4 11.5 - 15.5 %   Platelets 586 (H) 150 - 400 K/uL   nRBC 0.3 (H) 0.0 - 0.2 %   Neutrophils Relative % 49 %   Neutro Abs 3.9 1.7 - 7.7 K/uL   Lymphocytes Relative 35 %  Lymphs Abs 2.7 0.7 - 4.0 K/uL   Monocytes Relative 11 %   Monocytes Absolute 0.9 0.1 - 1.0 K/uL   Eosinophils Relative 3 %   Eosinophils Absolute 0.2 0.0 - 0.5 K/uL   Basophils Relative 1 %   Basophils Absolute 0.0 0.0 - 0.1 K/uL   Immature Granulocytes 1 %   Abs Immature Granulocytes 0.04 0.00 - 0.07 K/uL    Comment: Performed at [facility], [place] [address], [place], [place] [address]  Comprehensive metabolic panel     Status: Abnormal   Collection Time: 10/20/21 10:14 AM  Result Value Ref Range   Sodium 139 135 - 145 mmol/L   Potassium 4.2 3.5 - 5.1 mmol/L   Chloride 109 98 - 111 mmol/L   CO2 26 22 - 32 mmol/L   Glucose, Bld 99 70 - 99 mg/dL    Comment: Glucose reference range applies only to samples taken after fasting for at least 8 hours.   BUN 11 6 - 20 mg/dL   Creatinine, Ser 0.69 0.44 - 1.00 mg/dL   Calcium 8.9 8.9 - 10.3 mg/dL   Total Protein 7.7 6.5 - 8.1 g/dL   Albumin  4.0 3.5 - 5.0 g/dL   AST 21 15 - 41 U/L   ALT 19 0 - 44 U/L   Alkaline Phosphatase 56 38 - 126 U/L   Total Bilirubin 0.9 0.3 - 1.2 mg/dL   GFR, Estimated >60 >60 mL/min    Comment: (NOTE) Calculated using the CKD-EPI Creatinine Equation (2021)    Anion gap 4 (L) 5 - 15    Comment: Performed at [facility], [place] [address], [place], [place] [address]  Reticulocytes     Status: Abnormal   Collection Time: 10/20/21 10:14 AM  Result Value Ref Range   Retic Ct Pct 4.1 (H) 0.4 - 3.1 %   RBC. 3.44 (L) 3.87 - 5.11 MIL/uL   Retic Count, Absolute 141.4 19.0 - 186.0 K/uL   Immature Retic Fract 40.4 (H) 2.3 - 15.9 %    Comment: Performed at [facility], [place] [address] [place], [place] [address]  I-Stat beta hCG blood, ED     Status: None   Collection Time: 10/20/21 10:24 AM  Result Value Ref Range   I-stat hCG, quantitative <5.0 <5 mIU/mL   Comment 3            Comment:   GEST. AGE      CONC.  (mIU/mL)   <=1 WEEK        5 - 50     2 WEEKS       50 - 500     3 WEEKS       100 - 10,000     4 WEEKS     1,000 - 30,000        FEMALE AND NON-PREGNANT FEMALE:     LESS THAN 5 mIU/mL    No results found.  Review of Systems  Constitutional: Negative.   HENT: Negative.    Eyes: Negative.   Respiratory: Negative.    Cardiovascular: Negative.   Gastrointestinal: Negative.   Endocrine: Negative.   Genitourinary: Negative.   Musculoskeletal: Negative.   Skin: Negative.   Allergic/Immunologic: Negative.   Neurological: Negative.   Hematological: Negative.   Psychiatric/Behavioral: Negative.     Blood pressure 107/82, pulse 87, temperature 98.6 F (37 C), temperature source Oral, resp. rate 16, height 5\' 3"  (1.6 m), weight 86.2 kg, last  menstrual period 10/13/2021, SpO2 99 %. Physical Exam Constitutional:      Appearance: Normal appearance. She is ill-appearing.  HENT:     Head: Normocephalic and atraumatic.     Nose: Nose normal.     Mouth/Throat:      Mouth: Mucous membranes are dry.  Eyes:     Extraocular Movements: Extraocular movements intact.     Pupils: Pupils are equal, round, and reactive to light.  Cardiovascular:     Rate and Rhythm: Normal rate and regular rhythm.     Pulses: Normal pulses.     Heart sounds: Normal heart sounds.  Pulmonary:     Effort: Pulmonary effort is normal.     Breath sounds: Normal breath sounds.  Abdominal:     General: Bowel sounds are normal.  Musculoskeletal:        General: Normal range of motion.     Cervical back: Normal range of motion and neck supple.  Skin:    General: Skin is warm and dry.  Neurological:     General: No focal deficit present.     Mental Status: She is alert and oriented to person, place, and time.  Psychiatric:        Behavior: Behavior normal.     Assessment/Plan Patient is a 30 year old female with sickle cell disease being admitted with sickle cell painful crisis.  #1 sickle cell painful crisis: Patient will be admitted and initiated on Dilaudid PCA, Toradol, and IV fluids.  Continue sickle cell pain assessment and adjust medications as necessary.  #2 anemia of chronic disease: H&H is stable.  Continue to monitor  #3 chronic pain syndrome: Continue MS Contin.  Continue to monitor  #4 mood disorder: Patient is depressed.  Withdrawn.  Has a lot of stressors.  Barbette Merino, MD 10/20/2021, 2:29 PM

## 2021-10-20 NOTE — ED Triage Notes (Signed)
Patient c/o sickle cell pain crisis since Friday afternoon. States she called doc and went to sickle cell clinic yesterday. Attempted to go today but states they were closed. Having pain in bilateral arms, back and bilateral lower legs.

## 2021-10-20 NOTE — ED Provider Notes (Signed)
[facility] DEPT Provider Note   CSN: [id number] Arrival date & time: 10/20/21  7846     History Chief Complaint  Patient presents with   Sickle Cell Pain Crisis    Carmen [name] is a 29 y.o. female.  30 year old female presents with sickle cell pain crisis.  Patient seen at the day hospital yesterday for similar symptoms.  Medicated for better.  Believes that the change in weather that is causing her current exacerbation.  No new dyspnea, no fever or chills.  No cough or congestion.  No emesis.  No abdominal or chest discomfort.  Patient has not used her medications today.  Was told by her doctor to come here      Past Medical History:  Diagnosis Date   Anxiety    Headache(784.0)    Heart murmur    Sickle cell crisis ([place])    Syphilis 2015   Was diagnosed and received one injection of antibiotics   Thrombocytosis 11/22/2014     CBC Latest Ref Rng & Units 12/13/2018 12/11/2018 12/10/2018 WBC 4.0 - 10.5 K/uL 14.8(H) 13.2(H) 15.9(H) Hemoglobin 12.0 - 15.0 g/dL 8.2(L) 7.7(L) 8.1(L) Hematocrit 36.0 - 46.0 % 23.7(L) 23.2(L) 24.5(L) Platelets 150 - 400 K/uL 326 399 388      Patient Active Problem List   Diagnosis Date Noted   Acute chest syndrome due to sickle cell crisis ([place]) 09/05/2021   Chronic pain syndrome 08/21/2021   Moderate episode of recurrent major depressive disorder ([place]) 05/28/2021   Major depressive disorder 05/22/2021   Tobacco abuse 05/21/2021   Sickle cell disease with crisis ([place]) 03/23/2021   Acute pain of right shoulder    Sickle cell anemia with crisis ([place]) 02/16/2021   Community acquired pneumonia of right lower lobe of lung    Fever of unknown origin 10/14/2020   Sickle cell crisis ([place]) 03/31/2020   Bacterial vaginitis 01/04/2020   Acute cystitis without hematuria    Dysuria 12/31/2019   Urine leukocytes    Sickle cell pain crisis ([place]) 09/10/2019   Sickle cell disease ([place]) 12/04/2018   Alpha thalassemia silent  carrier 12/04/2018   Chronic prescription opiate use 03/13/2018   Chronic musculoskeletal pain 03/13/2018   Vitamin D deficiency 01/13/2018   Leukocytosis 08/21/2017   Hypokalemia 06/27/2017   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[place]) 04/05/2017   Hb-SS disease without crisis ([place]) 08/15/2016   Anemia of chronic disease    Chest pain 96/29/5284   Systolic ejection murmur 13/24/4010   Sickle cell anemia of mother during pregnancy ([place]) 05/16/2014    Past Surgical History:  Procedure Laterality Date   CESAREAN SECTION N/A 02/05/2019   Procedure: CESAREAN SECTION;  Surgeon: [name], MD;  Location: [facility] ORS;  Service: Obstetrics;  Laterality: N/A;   CHOLECYSTECTOMY N/A 11/30/2014   Procedure: LAPAROSCOPIC CHOLECYSTECTOMY SINGLE SITE WITH INTRAOPERATIVE CHOLANGIOGRAM;  Surgeon: Michael Boston, MD;  Location: [facility] ORS;  Service: General;  Laterality: N/A;   SPLENECTOMY       OB History     Gravida  2   Para  1   Term  1   Preterm      AB  1   Living  1      SAB      IAB  1   Ectopic      Multiple  0   Live Births  1           Family History  Problem Relation Age of  Onset   Hypertension Mother    Sickle cell anemia Sister    Kidney disease Sister        Lupus   Arthritis Sister    Sickle cell anemia Sister    Sickle cell trait Sister    Heart disease Maternal Aunt        CABG   Heart disease Maternal Uncle        CABG   Lupus Sister     Social History   Tobacco Use   Smoking status: Some Days    Types: Cigarettes   Smokeless tobacco: Never  Vaping Use   Vaping Use: Never used  Substance Use Topics   Alcohol use: No   Drug use: No    Home Medications Prior to Admission medications   Medication Sig Start Date End Date Taking? Authorizing Provider  citalopram (CELEXA) 20 MG tablet TAKE 1 TABLET BY MOUTH EVERY DAY Patient taking differently: Take 20 mg by mouth daily as needed (anxiety). 02/24/21   [name], FNP   cyclobenzaprine (FLEXERIL) 10 MG tablet TAKE 1 TABLET BY MOUTH TWICE A DAY 10/12/21   Vevelyn Francois, NP  DULoxetine (CYMBALTA) 30 MG capsule Take 2 capsules (60 mg total) by mouth daily. 09/18/21 09/18/22  Vevelyn Francois, NP  folic acid (FOLVITE) 1 MG tablet Take 1 tablet (1 mg total) by mouth daily. 03/15/19   Lanae Boast, FNP  morphine (MS CONTIN) 60 MG 12 hr tablet Take 1 tablet (60 mg total) by mouth every 12 (twelve) hours. 09/28/21 10/28/21  Vevelyn Francois, NP  Oxycodone HCl 10 MG TABS Take 1 tablet (10 mg total) by mouth every 6 (six) hours as needed for up to 15 days (pain). 10/10/21 10/25/21  Vevelyn Francois, NP  Prenatal Vit-Fe Fumarate-FA (PRENATAL VITAMIN PLUS LOW IRON) 27-1 MG TABS TAKE 1 TABLET BY MOUTH EVERY DAY Patient taking differently: Take 1 tablet by mouth daily. 03/03/20   Shelly Bombard, MD  Vitamin D, Ergocalciferol, (DRISDOL) 1.25 MG (50000 UNIT) CAPS capsule TAKE ONE CAPSULE BY MOUTH ONE TIME PER WEEK Patient taking differently: Take 50,000 Units by mouth every Wednesday. 09/03/20   Vevelyn Francois, NP    Allergies    Ketamine  Review of Systems   Review of Systems  All other systems reviewed and are negative.  Physical Exam Updated Vital Signs BP 126/84    Pulse 93    Temp 98.6 F (37 C) (Oral)    Resp 18    Ht 1.6 m (5\' 3" )    Wt 86.2 kg    LMP 10/13/2021    SpO2 100%    BMI 33.66 kg/m   Physical Exam Vitals and nursing note reviewed.  Constitutional:      General: She is not in acute distress.    Appearance: Normal appearance. She is well-developed. She is not toxic-appearing.  HENT:     Head: Normocephalic and atraumatic.  Eyes:     General: Lids are normal.     Conjunctiva/sclera: Conjunctivae normal.     Pupils: Pupils are equal, round, and reactive to light.  Neck:     Thyroid: No thyroid mass.     Trachea: No tracheal deviation.  Cardiovascular:     Rate and Rhythm: Normal rate and regular rhythm.     Heart sounds: Normal heart sounds.  No murmur heard.   No gallop.  Pulmonary:     Effort: Pulmonary effort is normal. No respiratory distress.  Breath sounds: Normal breath sounds. No stridor. No decreased breath sounds, wheezing, rhonchi or rales.  Abdominal:     General: There is no distension.     Palpations: Abdomen is soft.     Tenderness: There is no abdominal tenderness. There is no rebound.  Musculoskeletal:        General: No tenderness. Normal range of motion.     Cervical back: Normal range of motion and neck supple.  Skin:    General: Skin is warm and dry.     Findings: No abrasion or rash.  Neurological:     Mental Status: She is alert and oriented to person, place, and time. Mental status is at baseline.     GCS: GCS eye subscore is 4. GCS verbal subscore is 5. GCS motor subscore is 6.     Cranial Nerves: No cranial nerve deficit.     Sensory: No sensory deficit.     Motor: Motor function is intact.  Psychiatric:        Attention and Perception: Attention normal.        Speech: Speech normal.        Behavior: Behavior normal.    ED Results / Procedures / Treatments   Labs (all labs ordered are listed, but only abnormal results are displayed) Labs Reviewed - No data to display  EKG None  Radiology No results found.  Procedures Procedures   Medications Ordered in ED Medications  0.45 % sodium chloride infusion (has no administration in time range)  HYDROmorphone (DILAUDID) injection 2 mg (has no administration in time range)  HYDROmorphone (DILAUDID) injection 2 mg (has no administration in time range)  diphenhydrAMINE (BENADRYL) capsule 25-50 mg (has no administration in time range)  ondansetron (ZOFRAN) injection 4 mg (has no administration in time range)    ED Course  I have reviewed the triage vital signs and the nursing notes.  Pertinent labs & imaging results that were available during my care of the patient were reviewed by me and considered in my medical decision making (see  chart for details).    MDM Rules/Calculators/A&P                         Patient medicated for pain here and continues to be uncomfortable.  Will admit to the sickle cell service    Final Clinical Impression(s) / ED Diagnoses Final diagnoses:  None    Rx / DC Orders ED Discharge Orders     None        Lacretia Leigh, MD 10/20/21 1252

## 2021-10-21 LAB — COMPREHENSIVE METABOLIC PANEL
ALT: 50 U/L — ABNORMAL HIGH (ref 0–44)
AST: 76 U/L — ABNORMAL HIGH (ref 15–41)
Albumin: 3.6 g/dL (ref 3.5–5.0)
Alkaline Phosphatase: 61 U/L (ref 38–126)
Anion gap: 5 (ref 5–15)
BUN: 9 mg/dL (ref 6–20)
CO2: 27 mmol/L (ref 22–32)
Calcium: 8.8 mg/dL — ABNORMAL LOW (ref 8.9–10.3)
Chloride: 103 mmol/L (ref 98–111)
Creatinine, Ser: 0.46 mg/dL (ref 0.44–1.00)
GFR, Estimated: >60 mL/min (ref >60)
Glucose, Bld: 86 mg/dL (ref 70–99)
Potassium: 3.7 mmol/L (ref 3.5–5.1)
Sodium: 135 mmol/L (ref 135–145)
Total Bilirubin: 1.1 mg/dL (ref 0.3–1.2)
Total Protein: 6.5 g/dL (ref 6.5–8.1)

## 2021-10-21 NOTE — TOC Initial Note (Signed)
Transition of Care [facility]) - Initial/Assessment Note    Patient Details  Name: Carmen [name] MRN: [id number] Date of Birth: 07/03/1991  Transition of Care [facility]) CM/SW Contact:    Lynnell Catalan, RN Phone Number: 10/21/2021, 10:27 AM      Transition of Care (TOC) Screening Note   Patient Details  Name: Carmen [name] Date of Birth: 05-25-1991   Transition of Care [facility]) CM/SW Contact:    Awilda Covin, Marjie Skiff, RN Phone Number: 10/21/2021, 10:27 AM    Transition of Care Department (TOC) has reviewed patient and no TOC needs have been identified at this time. We will continue to monitor patient advancement through interdisciplinary progression rounds. If new patient transition needs arise, please place a TOC consult.      Activities of Daily Living Home Assistive Devices/Equipment: None ADL Screening (condition at time of admission) Patient's cognitive ability adequate to safely complete daily activities?: Yes Is the patient deaf or have difficulty hearing?: No Does the patient have difficulty seeing, even when wearing glasses/contacts?: No Does the patient have difficulty concentrating, remembering, or making decisions?: No Patient able to express need for assistance with ADLs?: Yes Does the patient have difficulty dressing or bathing?: No Independently performs ADLs?: Yes (appropriate for developmental age) Does the patient have difficulty walking or climbing stairs?: No Weakness of Legs: None Weakness of Arms/Hands: None     Admission diagnosis:  Sickle cell crisis ([place]) [D57.00] Sickle cell pain crisis ([place]) [D57.00] Patient Active Problem List   Diagnosis Date Noted   Acute chest syndrome due to sickle cell crisis ([place]) 09/05/2021   Chronic pain syndrome 08/21/2021   Moderate episode of recurrent major depressive disorder ([place]) 05/28/2021   Major depressive disorder 05/22/2021   Tobacco abuse 05/21/2021   Sickle cell disease with crisis ([place]) 03/23/2021   Acute  pain of right shoulder    Sickle cell anemia with crisis ([place]) 02/16/2021   Community acquired pneumonia of right lower lobe of lung    Fever of unknown origin 10/14/2020   Sickle cell crisis ([place]) 03/31/2020   Bacterial vaginitis 01/04/2020   Acute cystitis without hematuria    Dysuria 12/31/2019   Urine leukocytes    Sickle cell pain crisis ([place]) 09/10/2019   Sickle cell disease ([place]) 12/04/2018   Alpha thalassemia silent carrier 12/04/2018   Chronic prescription opiate use 03/13/2018   Chronic musculoskeletal pain 03/13/2018   Vitamin D deficiency 01/13/2018   Leukocytosis 08/21/2017   Hypokalemia 06/27/2017   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[place]) 04/05/2017   Hb-SS disease without crisis ([place]) 08/15/2016   Anemia of chronic disease    Chest pain 66/04/3015   Systolic ejection murmur 11/09/3233   Sickle cell anemia of mother during pregnancy ([place]) 05/16/2014   PCP:  Vevelyn Francois, NP Pharmacy:   [id number] - [place], [place] [address] Phone: [phone] Fax: [phone]     Social Determinants of Health (SDOH) Interventions    Readmission Risk Interventions Readmission Risk Prevention Plan 10/21/2021 09/07/2021 10/16/2020  Transportation Screening Complete Complete Complete  PCP or Specialist Appt within 3-5 Days - - Not Complete  Not Complete comments - - Not ready for dc yet  HRI or Home Care Consult - - Not Complete  HRI or Home Care Consult comments - - Not needed  Social Work Consult for [place] Planning/Counseling - - Complete  Palliative Care Screening - - Not  Applicable  Medication Review Press photographer) Complete Complete Complete  PCP or Specialist appointment within 3-5 days of discharge Complete Complete -  HRI or Home Care Consult Complete Complete -  SW Recovery Care/Counseling Consult Complete Complete -  Palliative Care Screening Not  Applicable Not Applicable -  [place] Not Applicable Not Applicable -  Some recent data might be hidden

## 2021-10-21 NOTE — Progress Notes (Signed)
Subjective: 30 year old female admitted with sickle cell painful crisis.  Patient is still having pain at 8 out of 10.  Currently on Dilaudid PCA with Toradol and IV fluids.  Hemodynamically stable.  Objective: Vital signs in last 24 hours: Temp:  [98 F (36.7 C)-98.7 F (37.1 C)] 98.4 F (36.9 C) (12/21 1040) Pulse Rate:  [60-87] 60 (12/21 1040) Resp:  [10-20] 18 (12/21 1040) BP: (97-113)/(61-82) 103/61 (12/21 1040) SpO2:  [99 %-100 %] 100 % (12/21 1040) FiO2 (%):  [100 %] 100 % (12/20 1937) Weight:  [86.2 kg] 86.2 kg (12/20 1745) Weight change:  Last BM Date: 10/19/21  Intake/Output from previous day: 12/20 0701 - 12/21 0700 In: 2764.1 [I.V.:2764.1] Out: -  Intake/Output this shift: No intake/output data recorded.  General appearance: alert, cooperative, appears stated age, and no distress Neck: no adenopathy, no carotid bruit, no JVD, supple, symmetrical, trachea midline, and thyroid not enlarged, symmetric, no tenderness/mass/nodules Back: symmetric, no curvature. ROM normal. No CVA tenderness. Resp: clear to auscultation bilaterally Cardio: regular rate and rhythm, S1, S2 normal, no murmur, click, rub or gallop GI: soft, non-tender; bowel sounds normal; no masses,  no organomegaly Extremities: extremities normal, atraumatic, no cyanosis or edema Pulses: 2+ and symmetric Neurologic: Grossly normal  Lab Results: Recent Labs    10/19/21 0950 10/20/21 1014  WBC 6.9 7.7  HGB 10.3* 10.2*  HCT 29.5* 28.3*  PLT 586* 586*   BMET Recent Labs    10/20/21 1014 10/21/21 0619  NA 139 135  K 4.2 3.7  CL 109 103  CO2 26 27  GLUCOSE 99 86  BUN 11 9  CREATININE 0.69 0.46  CALCIUM 8.9 8.8*    Studies/Results: No results found.  Medications: I have reviewed the patient's current medications.  Assessment/Plan: A 30 year old female admitted with sickle cell painful crisis.  Patient is still having significant pain.  No fever or chills no nausea vomiting or  diarrhea.  #1 sickle cell painful crisis: Patient will be maintained on current regimen of Dilaudid PCA with a Toradol and IV fluids.  We will continue to monitor her pain and assess and adjust medications.  #2 anemia of chronic disease: H&H appears stable.  We will continue to monitor.  #3 chronic pain syndrome: Continue long-acting pain medications.  #4 thrombocytosis: Due to vaso-occlusive crisis.  Continue to monitor    LOS: 1 day   Santosh Petter,LAWAL 10/21/2021, 12:35 PM

## 2021-10-21 NOTE — Progress Notes (Signed)
Pt has refused all vitals up until this point.  Pt becomes agitated when this RN explains that she needs to put back on her nasal cannula to monitor respirations and CO2 level.  Clarene Essex, NP made aware of patient's refusals as well as charge RN [place].  Pt breathing even and non-labored.  Pt appears comfortable.

## 2021-10-21 NOTE — Progress Notes (Addendum)
Pt continues to refuse monitoring of any sort.  When woken up, pt states "I don't have to be monitored if I don't want to.  This RN explained that due to the medication she is receiving via PCA, it is necessary for her to monitored as adverse events can happen when overmedicated.  Pt states "I'm a sickle cell patient, you can't tell me that I can't have pain medication".  Again, this RN reiterated that there is a need for constant monitoring due to the nature of medication being given.  Pt refusing teaching and asking to speak to charge RN.  Charge RN Renita made aware and will be resuming care for patient at this time.   Spoke with Clarene Essex, NP.  Provider in agreement with this RN at this time.

## 2021-10-22 NOTE — Progress Notes (Signed)
Subjective: Patient still having 7 out of 10 pain.  No fever or chills no nausea vomiting or diarrhea..  Objective: Vital signs in last 24 hours: Temp:  [98.3 F (36.8 C)-98.7 F (37.1 C)] 98.4 F (36.9 C) (12/22 0956) Pulse Rate:  [60-76] 68 (12/22 0956) Resp:  [15-20] 15 (12/22 0956) BP: (96-134)/(59-88) 134/87 (12/22 0956) SpO2:  [97 %-100 %] 100 % (12/22 0956) Weight change:  Last BM Date: 10/21/21  Intake/Output from previous day: No intake/output data recorded. Intake/Output this shift: No intake/output data recorded.  General appearance: alert, cooperative, appears stated age, and no distress Neck: no adenopathy, no carotid bruit, no JVD, supple, symmetrical, trachea midline, and thyroid not enlarged, symmetric, no tenderness/mass/nodules Back: symmetric, no curvature. ROM normal. No CVA tenderness. Resp: clear to auscultation bilaterally Cardio: regular rate and rhythm, S1, S2 normal, no murmur, click, rub or gallop GI: soft, non-tender; bowel sounds normal; no masses,  no organomegaly Extremities: extremities normal, atraumatic, no cyanosis or edema Pulses: 2+ and symmetric Neurologic: Grossly normal  Lab Results: Recent Labs    10/20/21 1014  WBC 7.7  HGB 10.2*  HCT 28.3*  PLT 586*    BMET Recent Labs    10/20/21 1014 10/21/21 0619  NA 139 135  K 4.2 3.7  CL 109 103  CO2 26 27  GLUCOSE 99 86  BUN 11 9  CREATININE 0.69 0.46  CALCIUM 8.9 8.8*     Studies/Results: No results found.  Medications: I have reviewed the patient's current medications.  Assessment/Plan: A 30 year old female admitted with sickle cell painful crisis.  Patient is still having significant pain.  No fever or chills no nausea vomiting or diarrhea.  #1 sickle cell painful crisis: Patient is showing slight improvement so will be maintained on current regimen of Dilaudid PCA with a Toradol and IV fluids.  We will continue to monitor her pain and assess and adjust  medications.  #2 anemia of chronic disease: H&H appears stable.  We will continue to monitor.  #3 chronic pain syndrome: Continue long-acting pain medications.  #4 thrombocytosis: Due to vaso-occlusive crisis.  Continue to monitor    LOS: 2 days   Avien Taha,LAWAL 10/22/2021, 10:04 AM

## 2021-10-23 LAB — COMPREHENSIVE METABOLIC PANEL
ALT: 38 U/L (ref 0–44)
AST: 38 U/L (ref 15–41)
Albumin: 3.9 g/dL (ref 3.5–5.0)
Alkaline Phosphatase: 64 U/L (ref 38–126)
Anion gap: 8 (ref 5–15)
BUN: 15 mg/dL (ref 6–20)
CO2: 30 mmol/L (ref 22–32)
Calcium: 9.5 mg/dL (ref 8.9–10.3)
Chloride: 99 mmol/L (ref 98–111)
Creatinine, Ser: 0.48 mg/dL (ref 0.44–1.00)
GFR, Estimated: >60 mL/min (ref >60)
Glucose, Bld: 96 mg/dL (ref 70–99)
Potassium: 4 mmol/L (ref 3.5–5.1)
Sodium: 137 mmol/L (ref 135–145)
Total Bilirubin: 2 mg/dL — ABNORMAL HIGH (ref 0.3–1.2)
Total Protein: 7.2 g/dL (ref 6.5–8.1)

## 2021-10-23 LAB — CBC WITH DIFFERENTIAL/PLATELET
Abs Immature Granulocytes: 0.08 10*3/uL — ABNORMAL HIGH (ref 0.00–0.07)
Basophils Absolute: 0.1 10*3/uL (ref 0.0–0.1)
Basophils Relative: 0 %
Eosinophils Absolute: 0.4 10*3/uL (ref 0.0–0.5)
Eosinophils Relative: 3 %
HCT: 24.3 % — ABNORMAL LOW (ref 36.0–46.0)
Hemoglobin: 8.8 g/dL — ABNORMAL LOW (ref 12.0–15.0)
Immature Granulocytes: 1 %
Lymphocytes Relative: 30 %
Lymphs Abs: 4.2 10*3/uL — ABNORMAL HIGH (ref 0.7–4.0)
MCH: 29.8 pg (ref 26.0–34.0)
MCHC: 36.2 g/dL — ABNORMAL HIGH (ref 30.0–36.0)
MCV: 82.4 fL (ref 80.0–100.0)
Monocytes Absolute: 1.4 10*3/uL — ABNORMAL HIGH (ref 0.1–1.0)
Monocytes Relative: 10 %
Neutro Abs: 7.7 10*3/uL (ref 1.7–7.7)
Neutrophils Relative %: 56 %
Platelets: 448 10*3/uL — ABNORMAL HIGH (ref 150–400)
RBC: 2.95 MIL/uL — ABNORMAL LOW (ref 3.87–5.11)
RDW: 15 % (ref 11.5–15.5)
WBC: 13.8 10*3/uL — ABNORMAL HIGH (ref 4.0–10.5)
nRBC: 0.4 % — ABNORMAL HIGH (ref 0.0–0.2)

## 2021-10-23 NOTE — Discharge Summary (Signed)
Physician Discharge Summary  Patient ID: Carmen [name] MRN: [id number] DOB/AGE: 1991/06/28 30 y.o.  Admit date: 10/20/2021 Discharge date: 10/23/2021  Admission Diagnoses:  Discharge Diagnoses:  Active Problems:   Sickle cell crisis (HCC)   Moderate episode of recurrent major depressive disorder (HCC)   Chronic pain syndrome   Discharged Condition: good  Hospital Course: Patient is a 30 year old female with sickle cell disease who was admitted with sickle cell painful crisis.  Patient was treated with IV Dilaudid PCA, Toradol IV fluids.  Her long-acting pain medication was continued with.  Later transitioned to oral pain medications.  She did have significant depression.  Also other significant symptoms.  Subsequently discharged home on her home regimen.  Her pain is down to 4 out of 10.  Consults: None  Significant Diagnostic Studies: labs: Serial CBCs and CMP is checked.  No transfusion was indicated.  Treatments: IV hydration and analgesia: acetaminophen and Dilaudid  Discharge Exam: Blood pressure 112/70, pulse 86, temperature 98.4 F (36.9 C), temperature source Oral, resp. rate 16, height 5\' 3"  (1.6 m), weight 86.2 kg, last menstrual period 10/13/2021, SpO2 94 %. General appearance: alert, cooperative, appears stated age, and no distress Head: Normocephalic, without obvious abnormality, atraumatic Neck: no adenopathy, no carotid bruit, no JVD, supple, symmetrical, trachea midline, and thyroid not enlarged, symmetric, no tenderness/mass/nodules Back: symmetric, no curvature. ROM normal. No CVA tenderness. Resp: clear to auscultation bilaterally Cardio: regular rate and rhythm, S1, S2 normal, no murmur, click, rub or gallop GI: soft, non-tender; bowel sounds normal; no masses,  no organomegaly Extremities: extremities normal, atraumatic, no cyanosis or edema Pulses: 2+ and symmetric Skin: Skin color, texture, turgor normal. No rashes or lesions  Disposition: Discharge  disposition: 01-Home or Self Care       Discharge Instructions     Diet - low sodium heart healthy   Complete by: As directed    Increase activity slowly   Complete by: As directed       Allergies as of 10/23/2021       Reactions   Ketamine Hives, Other (See Comments)   Makes feel funny        Medication List     TAKE these medications    betamethasone valerate ointment 0.1 % Commonly known as: VALISONE Apply 1 application topically in the morning and at bedtime.   citalopram 20 MG tablet Commonly known as: CELEXA TAKE 1 TABLET BY MOUTH EVERY DAY What changed:  when to take this reasons to take this   cyclobenzaprine 10 MG tablet Commonly known as: FLEXERIL TAKE 1 TABLET BY MOUTH TWICE A DAY What changed:  when to take this reasons to take this   DULoxetine 30 MG capsule Commonly known as: CYMBALTA Take 2 capsules (60 mg total) by mouth daily.   folic acid 1 MG tablet Commonly known as: FOLVITE Take 1 tablet (1 mg total) by mouth daily.   morphine 60 MG 12 hr tablet Commonly known as: MS Contin Take 1 tablet (60 mg total) by mouth every 12 (twelve) hours.   Oxycodone HCl 10 MG Tabs Take 1 tablet (10 mg total) by mouth every 6 (six) hours as needed for up to 15 days (pain).   Prenatal Vitamin Plus Low Iron 27-1 MG Tabs TAKE 1 TABLET BY MOUTH EVERY DAY   Vitamin D (Ergocalciferol) 1.25 MG (50000 UNIT) Caps capsule Commonly known as: DRISDOL TAKE ONE CAPSULE BY MOUTH ONE TIME PER WEEK What changed:  how much to take how to  take this when to take this additional instructions         Signed: Shaquon Gropp,LAWAL 10/23/2021, 12:11 PM Time spent 32 minutes

## 2021-10-26 ENCOUNTER — Encounter (HOSPITAL_COMMUNITY): Payer: Self-pay | Admitting: Family Medicine

## 2021-10-26 ENCOUNTER — Other Ambulatory Visit: Payer: Self-pay

## 2021-10-26 ENCOUNTER — Inpatient Hospital Stay (HOSPITAL_COMMUNITY)
Admission: EM | Admit: 2021-10-26 | Discharge: 2021-10-30 | DRG: 812 | Disposition: A | Discharge status: Home / Self Care | Payer: Medicaid Other | Attending: Internal Medicine | Admitting: Internal Medicine

## 2021-10-26 DIAGNOSIS — Z79899 Other long term (current) drug therapy: Secondary | ICD-10-CM

## 2021-10-26 DIAGNOSIS — F419 Anxiety disorder, unspecified: Secondary | ICD-10-CM | POA: Diagnosis present

## 2021-10-26 DIAGNOSIS — F32A Depression, unspecified: Secondary | ICD-10-CM | POA: Diagnosis present

## 2021-10-26 DIAGNOSIS — Z20822 Contact with and (suspected) exposure to covid-19: Secondary | ICD-10-CM | POA: Diagnosis present

## 2021-10-26 DIAGNOSIS — G894 Chronic pain syndrome: Secondary | ICD-10-CM | POA: Diagnosis present

## 2021-10-26 DIAGNOSIS — A539 Syphilis, unspecified: Secondary | ICD-10-CM | POA: Diagnosis present

## 2021-10-26 DIAGNOSIS — F1120 Opioid dependence, uncomplicated: Secondary | ICD-10-CM | POA: Diagnosis present

## 2021-10-26 DIAGNOSIS — R011 Cardiac murmur, unspecified: Secondary | ICD-10-CM | POA: Diagnosis present

## 2021-10-26 DIAGNOSIS — Z9049 Acquired absence of other specified parts of digestive tract: Secondary | ICD-10-CM | POA: Diagnosis not present

## 2021-10-26 DIAGNOSIS — Z832 Family history of diseases of the blood and blood-forming organs and certain disorders involving the immune mechanism: Secondary | ICD-10-CM

## 2021-10-26 DIAGNOSIS — D5700 Hb-SS disease with crisis, unspecified: Principal | ICD-10-CM | POA: Diagnosis present

## 2021-10-26 DIAGNOSIS — D638 Anemia in other chronic diseases classified elsewhere: Secondary | ICD-10-CM | POA: Diagnosis present

## 2021-10-26 DIAGNOSIS — M545 Low back pain, unspecified: Secondary | ICD-10-CM | POA: Diagnosis present

## 2021-10-26 LAB — CBC WITH DIFFERENTIAL/PLATELET
Abs Immature Granulocytes: 0.08 10*3/uL — ABNORMAL HIGH (ref 0.00–0.07)
Basophils Absolute: 0 10*3/uL (ref 0.0–0.1)
Basophils Relative: 0 %
Eosinophils Absolute: 0.3 10*3/uL (ref 0.0–0.5)
Eosinophils Relative: 3 %
HCT: 27.7 % — ABNORMAL LOW (ref 36.0–46.0)
Hemoglobin: 10 g/dL — ABNORMAL LOW (ref 12.0–15.0)
Immature Granulocytes: 1 %
Lymphocytes Relative: 26 %
Lymphs Abs: 2.7 10*3/uL (ref 0.7–4.0)
MCH: 29.7 pg (ref 26.0–34.0)
MCHC: 36.1 g/dL — ABNORMAL HIGH (ref 30.0–36.0)
MCV: 82.2 fL (ref 80.0–100.0)
Monocytes Absolute: 1.6 10*3/uL — ABNORMAL HIGH (ref 0.1–1.0)
Monocytes Relative: 15 %
Neutro Abs: 5.9 10*3/uL (ref 1.7–7.7)
Neutrophils Relative %: 55 %
Platelets: 486 10*3/uL — ABNORMAL HIGH (ref 150–400)
RBC: 3.37 MIL/uL — ABNORMAL LOW (ref 3.87–5.11)
RDW: 14.6 % (ref 11.5–15.5)
WBC: 10.6 10*3/uL — ABNORMAL HIGH (ref 4.0–10.5)
nRBC: 0.5 % — ABNORMAL HIGH (ref 0.0–0.2)

## 2021-10-26 LAB — COMPREHENSIVE METABOLIC PANEL
ALT: 50 U/L — ABNORMAL HIGH (ref 0–44)
AST: 37 U/L (ref 15–41)
Albumin: 4.4 g/dL (ref 3.5–5.0)
Alkaline Phosphatase: 65 U/L (ref 38–126)
Anion gap: 5 (ref 5–15)
BUN: 15 mg/dL (ref 6–20)
CO2: 24 mmol/L (ref 22–32)
Calcium: 9.3 mg/dL (ref 8.9–10.3)
Chloride: 105 mmol/L (ref 98–111)
Creatinine, Ser: 0.63 mg/dL (ref 0.44–1.00)
GFR, Estimated: >60 mL/min (ref >60)
Glucose, Bld: 98 mg/dL (ref 70–99)
Potassium: 4.4 mmol/L (ref 3.5–5.1)
Sodium: 134 mmol/L — ABNORMAL LOW (ref 135–145)
Total Bilirubin: 1.3 mg/dL — ABNORMAL HIGH (ref 0.3–1.2)
Total Protein: 8.6 g/dL — ABNORMAL HIGH (ref 6.5–8.1)

## 2021-10-26 LAB — RETICULOCYTES
Immature Retic Fract: 35.7 % — ABNORMAL HIGH (ref 2.3–15.9)
RBC.: 3.39 MIL/uL — ABNORMAL LOW (ref 3.87–5.11)
Retic Count, Absolute: 208.5 10*3/uL — ABNORMAL HIGH (ref 19.0–186.0)
Retic Ct Pct: 6.2 % — ABNORMAL HIGH (ref 0.4–3.1)

## 2021-10-26 LAB — RESP PANEL BY RT-PCR (FLU A&B, COVID) ARPGX2
Influenza A by PCR: NEGATIVE
Influenza B by PCR: NEGATIVE
SARS Coronavirus 2 by RT PCR: NEGATIVE

## 2021-10-26 LAB — I-STAT BETA HCG BLOOD, ED (MC, WL, AP ONLY): I-stat hCG, quantitative: <5 m[IU]/mL (ref <5)

## 2021-10-26 MED ORDER — ONDANSETRON HCL 4 MG/2ML IJ SOLN
4.0000 mg | INTRAMUSCULAR | Status: DC | PRN
  Administered 2021-10-26: 14:00:00 4 mg via INTRAVENOUS
  Filled 2021-10-26: qty 2

## 2021-10-26 MED ORDER — LOPERAMIDE HCL 2 MG PO CAPS: 2.0000 mg | ORAL_CAPSULE | ORAL | Status: DC | PRN

## 2021-10-26 MED ORDER — HYDROMORPHONE HCL 2 MG/ML IJ SOLN
2.0000 mg | INTRAMUSCULAR | Status: AC
  Administered 2021-10-26: 13:00:00 2 mg via INTRAVENOUS
  Filled 2021-10-26: qty 1

## 2021-10-26 MED ORDER — FOLIC ACID 1 MG PO TABS
1.0000 mg | ORAL_TABLET | Freq: Every day | ORAL | Status: DC
  Administered 2021-10-27 – 2021-10-30 (×4): 1 mg via ORAL
  Filled 2021-10-26 (×4): qty 1

## 2021-10-26 MED ORDER — SENNOSIDES-DOCUSATE SODIUM 8.6-50 MG PO TABS
1.0000 | ORAL_TABLET | Freq: Two times a day (BID) | ORAL | Status: DC
  Administered 2021-10-26 – 2021-10-30 (×7): 1 via ORAL
  Filled 2021-10-26 (×7): qty 1

## 2021-10-26 MED ORDER — DIPHENHYDRAMINE HCL 25 MG PO CAPS: 25.0000 mg | ORAL_CAPSULE | ORAL | Status: DC | PRN

## 2021-10-26 MED ORDER — KETOROLAC TROMETHAMINE 15 MG/ML IJ SOLN
15.0000 mg | Freq: Four times a day (QID) | INTRAMUSCULAR | Status: DC
  Administered 2021-10-26 – 2021-10-30 (×16): 15 mg via INTRAVENOUS
  Filled 2021-10-26 (×16): qty 1

## 2021-10-26 MED ORDER — DULOXETINE HCL 60 MG PO CPEP
60.0000 mg | ORAL_CAPSULE | Freq: Every day | ORAL | Status: DC
  Administered 2021-10-26 – 2021-10-30 (×5): 60 mg via ORAL
  Filled 2021-10-26 (×5): qty 1

## 2021-10-26 MED ORDER — MORPHINE SULFATE ER 15 MG PO TBCR
60.0000 mg | EXTENDED_RELEASE_TABLET | Freq: Two times a day (BID) | ORAL | Status: DC
  Administered 2021-10-26 – 2021-10-30 (×8): 60 mg via ORAL
  Filled 2021-10-26 (×8): qty 4

## 2021-10-26 MED ORDER — CITALOPRAM HYDROBROMIDE 20 MG PO TABS: 20.0000 mg | ORAL_TABLET | Freq: Every day | ORAL | Status: DC | PRN

## 2021-10-26 MED ORDER — ONDANSETRON HCL 4 MG/2ML IJ SOLN: 4.0000 mg | Freq: Four times a day (QID) | INTRAMUSCULAR | Status: DC | PRN

## 2021-10-26 MED ORDER — SODIUM CHLORIDE 0.9% FLUSH: 9.0000 mL | INTRAVENOUS | Status: DC | PRN

## 2021-10-26 MED ORDER — ENOXAPARIN SODIUM 40 MG/0.4ML IJ SOSY
40.0000 mg | PREFILLED_SYRINGE | Freq: Every day | INTRAMUSCULAR | Status: DC
  Filled 2021-10-26 (×2): qty 0.4

## 2021-10-26 MED ORDER — POLYETHYLENE GLYCOL 3350 17 G PO PACK: 17.0000 g | PACK | Freq: Every day | ORAL | Status: DC | PRN

## 2021-10-26 MED ORDER — DIPHENHYDRAMINE HCL 50 MG/ML IJ SOLN
25.0000 mg | Freq: Once | INTRAMUSCULAR | Status: AC
  Administered 2021-10-26: 14:00:00 25 mg via INTRAVENOUS
  Filled 2021-10-26: qty 1

## 2021-10-26 MED ORDER — SODIUM CHLORIDE 0.45 % IV SOLN: INTRAVENOUS | Status: DC

## 2021-10-26 MED ORDER — NALOXONE HCL 0.4 MG/ML IJ SOLN: 0.4000 mg | INTRAMUSCULAR | Status: DC | PRN

## 2021-10-26 MED ORDER — HYDROMORPHONE 1 MG/ML IV SOLN
INTRAVENOUS | Status: DC
  Administered 2021-10-26: 6 mg via INTRAVENOUS
  Administered 2021-10-26: 30 mg via INTRAVENOUS
  Administered 2021-10-26: 7.6 mg via INTRAVENOUS
  Administered 2021-10-27: 8.31 mg via INTRAVENOUS
  Administered 2021-10-27: 9.6 mg via INTRAVENOUS
  Administered 2021-10-27: 30 mg via INTRAVENOUS
  Administered 2021-10-27: 10.8 mg via INTRAVENOUS
  Administered 2021-10-28: 4.2 mg via INTRAVENOUS
  Administered 2021-10-28 (×2): 13.8 mg via INTRAVENOUS
  Administered 2021-10-28 (×2): 30 mg via INTRAVENOUS
  Administered 2021-10-29: 7.2 mg via INTRAVENOUS
  Administered 2021-10-29: 5.4 mg via INTRAVENOUS
  Filled 2021-10-26 (×4): qty 30

## 2021-10-26 MED ORDER — HYDROMORPHONE HCL 2 MG/ML IJ SOLN
2.0000 mg | INTRAMUSCULAR | Status: AC
  Administered 2021-10-26: 14:00:00 2 mg via INTRAVENOUS
  Filled 2021-10-26: qty 1

## 2021-10-26 NOTE — ED Triage Notes (Signed)
Pt bib ems for SCC x2 weeks. Pt attempted to go to [facility] but they are not open today due to holiday. Pt states pain meds at home not working w/ pain.

## 2021-10-26 NOTE — H&P (Signed)
H&P  Patient Demographics:  Carmen [name], is a 30 y.o. female  MRN: [id number]   DOB - 06-23-1991  Admit Date - 10/26/2021  Outpatient Primary MD for the patient is Vevelyn Francois, NP  Chief Complaint  Patient presents with   Sickle Cell Pain Crisis      HPI:   Carmen [name]  is a 30 y.o. female with a medical history significant for sickle cell disease, chronic pain syndrome, opiate dependence and tolerance, history of depression, and history of anemia of chronic disease presented to the emergency department with complaints of low back and lower extremity pain that is consistent with her typical sickle cell crisis.  Patient attributes current pain crisis to changes in weather.  She was admitted to inpatient services last week for this problem and states that she continued to have increased pain at discharge, however she wanted to celebrate the holidays with her family.  Her pain intensity is 8/10 characterized as constant and throbbing.  Patient last had MS Contin and oxycodone this a.m. without sustained relief.  She denies any blurry vision, headaches, fever, chill, or dizziness.  No nausea, vomiting, diarrhea, or urinary symptoms.  No sick contacts, recent travel, or known exposure to COVID-19.  While in ER: Blood pressure 115/67, temperature 98.3 F, pulse rate 75, and respirations 14.  Oxygen saturation 97% on room air.  Complete blood count: WBCs 10.6, hemoglobin 10 g/dL, and platelet 486,000.  Comprehensive metabolic panel: AST 37, ALT 15, and total bilirubin 1.3, otherwise unremarkable.  COVID-19 and influenza tests negative.  Pain persists despite IV Dilaudid and IV Toradol.  Patient thereby admitted to inpatient services for sickle cell pain crisis.   Review of systems:  In addition to the HPI above, patient reports No fever or chills No Headache, No changes with vision or hearing No problems swallowing food or liquids No chest pain, cough or shortness of breath No abdominal pain,  No nausea or vomiting, Bowel movements are regular No blood in stool or urine No dysuria No new skin rashes or bruises No new joints pains-aches No new weakness, tingling, numbness in any extremity No recent weight gain or loss No polyuria, polydypsia or polyphagia No significant Mental Stressors   With Past History of the following :   Past Medical History:  Diagnosis Date   Anxiety    Headache(784.0)    Heart murmur    Sickle cell crisis ([place])    Syphilis 2015   Was diagnosed and received one injection of antibiotics   Thrombocytosis 11/22/2014     CBC Latest Ref Rng & Units 12/13/2018 12/11/2018 12/10/2018 WBC 4.0 - 10.5 K/uL 14.8(H) 13.2(H) 15.9(H) Hemoglobin 12.0 - 15.0 g/dL 8.2(L) 7.7(L) 8.1(L) Hematocrit 36.0 - 46.0 % 23.7(L) 23.2(L) 24.5(L) Platelets 150 - 400 K/uL 326 399 388        Past Surgical History:  Procedure Laterality Date   CESAREAN SECTION N/A 02/05/2019   Procedure: CESAREAN SECTION;  Surgeon: [name], MD;  Location: [facility];  Service: Obstetrics;  Laterality: N/A;   CHOLECYSTECTOMY N/A 11/30/2014   Procedure: LAPAROSCOPIC CHOLECYSTECTOMY SINGLE SITE WITH INTRAOPERATIVE CHOLANGIOGRAM;  Surgeon: Michael Boston, MD;  Location: [facility] ORS;  Service: General;  Laterality: N/A;   SPLENECTOMY       Social History:   Social History   Tobacco Use   Smoking status: Some Days    Types: Cigarettes   Smokeless tobacco: Never  Substance Use Topics   Alcohol use: No  Lives - At home   Family History :   Family History  Problem Relation Age of Onset   Hypertension Mother    Sickle cell anemia Sister    Kidney disease Sister        Lupus   Arthritis Sister    Sickle cell anemia Sister    Sickle cell trait Sister    Heart disease Maternal Aunt        CABG   Heart disease Maternal Uncle        CABG   Lupus Sister      Home Medications:   Prior to Admission medications   Medication Sig Start Date End Date Taking? Authorizing Provider   betamethasone valerate ointment (VALISONE) 0.1 % Apply 1 application topically in the morning and at bedtime. 10/10/21  Yes [provider]  citalopram (CELEXA) 20 MG tablet TAKE 1 TABLET BY MOUTH EVERY DAY Patient taking differently: Take 20 mg by mouth daily as needed (anxiety). 02/24/21  Yes [name], FNP  cyclobenzaprine (FLEXERIL) 10 MG tablet TAKE 1 TABLET BY MOUTH TWICE A DAY Patient taking differently: Take 10 mg by mouth 2 (two) times daily as needed for muscle spasms. 10/12/21  Yes Vevelyn Francois, NP  DULoxetine (CYMBALTA) 30 MG capsule Take 2 capsules (60 mg total) by mouth daily. 10/21/21 04/19/22 Yes King, Diona Foley, NP  folic acid (FOLVITE) 1 MG tablet Take 1 tablet (1 mg total) by mouth daily. 03/15/19  Yes Lanae Boast, FNP  morphine (MS CONTIN) 60 MG 12 hr tablet Take 1 tablet (60 mg total) by mouth every 12 (twelve) hours. 09/28/21 10/28/21 Yes Vevelyn Francois, NP  Oxycodone HCl 10 MG TABS Take 10 mg by mouth every 6 (six) hours as needed (pain).   Yes [provider]  Prenatal Vit-Fe Fumarate-FA (PRENATAL VITAMIN PLUS LOW IRON) 27-1 MG TABS TAKE 1 TABLET BY MOUTH EVERY DAY Patient taking differently: Take 1 tablet by mouth daily. 03/03/20  Yes Shelly Bombard, MD  Vitamin D, Ergocalciferol, (DRISDOL) 1.25 MG (50000 UNIT) CAPS capsule TAKE ONE CAPSULE BY MOUTH ONE TIME PER WEEK Patient taking differently: Take 50,000 Units by mouth every Wednesday. 09/03/20  Yes Vevelyn Francois, NP  Oxycodone HCl 10 MG TABS Take 1 tablet (10 mg total) by mouth every 6 (six) hours as needed for up to 15 days (pain). 10/10/21 10/25/21  Vevelyn Francois, NP     Allergies:   Allergies  Allergen Reactions   Ketamine Hives and Other (See Comments)    Makes feel funny     Physical Exam:   Vitals:   Vitals:   10/26/21 1400 10/26/21 1513  BP: 124/76 105/62  Pulse: 95 95  Resp: 17 20  Temp:    SpO2: 99% 100%    Physical Exam: Constitutional: Patient appears  well-developed and well-nourished. Not in obvious distress. HENT: Normocephalic, atraumatic, External right and left ear normal. Oropharynx is clear and moist.  Eyes: Conjunctivae and EOM are normal. PERRLA, no scleral icterus. Neck: Normal ROM. Neck supple. No JVD. No tracheal deviation. No thyromegaly. CVS: RRR, S1/S2 +, no murmurs, no gallops, no carotid bruit.  Pulmonary: Effort and breath sounds normal, no stridor, rhonchi, wheezes, rales.  Abdominal: Soft. BS +, no distension, tenderness, rebound or guarding.  Musculoskeletal: Normal range of motion. No edema and no tenderness.  Lymphadenopathy: No lymphadenopathy noted, cervical, inguinal or axillary Neuro: Alert. Normal reflexes, muscle tone coordination. No cranial nerve deficit. Skin: Skin is warm and  dry. No rash noted. Not diaphoretic. No erythema. No pallor. Psychiatric: Normal mood and affect. Behavior, judgment, thought content normal.   Data Review:   CBC Recent Labs  Lab 10/20/21 1014 10/23/21 0508 10/26/21 1345  WBC 7.7 13.8* 10.6*  HGB 10.2* 8.8* 10.0*  HCT 28.3* 24.3* 27.7*  PLT 586* 448* 486*  MCV 84.2 82.4 82.2  MCH 30.4 29.8 29.7  MCHC 36.0 36.2* 36.1*  RDW 14.4 15.0 14.6  LYMPHSABS 2.7 4.2* 2.7  MONOABS 0.9 1.4* 1.6*  EOSABS 0.2 0.4 0.3  BASOSABS 0.0 0.1 0.0   ------------------------------------------------------------------------------------------------------------------  Chemistries  Recent Labs  Lab 10/20/21 1014 10/21/21 0619 10/23/21 0508 10/26/21 1345  NA 139 135 137 134*  K 4.2 3.7 4.0 4.4  CL 109 103 99 105  CO2 26 27 30 24   GLUCOSE 99 86 96 98  BUN 11 9 15 15   CREATININE 0.69 0.46 0.48 0.63  CALCIUM 8.9 8.8* 9.5 9.3  AST 21 76* 38 37  ALT 19 50* 38 50*  ALKPHOS 56 61 64 65  BILITOT 0.9 1.1 2.0* 1.3*   ------------------------------------------------------------------------------------------------------------------ estimated creatinine clearance is 108.9 mL/min (by C-G formula  based on SCr of 0.63 mg/dL). ------------------------------------------------------------------------------------------------------------------ No results for input(s): TSH, T4TOTAL, T3FREE, THYROIDAB in the last 72 hours.  Invalid input(s): FREET3  Coagulation profile No results for input(s): INR, PROTIME in the last 168 hours. ------------------------------------------------------------------------------------------------------------------- No results for input(s): DDIMER in the last 72 hours. -------------------------------------------------------------------------------------------------------------------  Cardiac Enzymes No results for input(s): CKMB, TROPONINI, MYOGLOBIN in the last 168 hours.  Invalid input(s): CK ------------------------------------------------------------------------------------------------------------------ No results found for: BNP  ---------------------------------------------------------------------------------------------------------------  Urinalysis    Component Value Date/Time   COLORURINE STRAW (A) 10/19/2021 1045   APPEARANCEUR CLEAR 10/19/2021 1045   LABSPEC 1.005 10/19/2021 1045   PHURINE 6.0 10/19/2021 1045   GLUCOSEU NEGATIVE 10/19/2021 [place] 10/19/2021 [place] 10/19/2021 1045   BILIRUBINUR negative 08/26/2021 1527   BILIRUBINUR Negative 08/10/2019 [place] 10/19/2021 1045   PROTEINUR NEGATIVE 10/19/2021 1045   UROBILINOGEN 1.0 08/26/2021 1527   UROBILINOGEN 1.0 01/12/2018 0943   NITRITE NEGATIVE 10/19/2021 [place] 10/19/2021 1045    ----------------------------------------------------------------------------------------------------------------   Imaging Results:    No results found.   Assessment & Plan:  Principal Problem:   Sickle cell pain crisis (HCC) Active Problems:   Anemia of chronic disease   Chronic pain syndrome  Sickle cell disease with  pain crisis: Admit to inpatient for sickle cell pain crisis.  Initiate IV Dilaudid PCA. IV fluids, 0.45% saline at 100 mL/h Toradol 15 mg IV every 6 hours MS Contin 30 mg every 12 hours Oxycodone 10 mg every 6 hours as needed for severe breakthrough pain Monitor vital signs very closely, reevaluate pain scale regularly, and supplemental oxygen as needed. Patient will be reevaluated for pain in the context of function and relationship to baseline as her care progresses.  Chronic pain syndrome: Continue home medications  Anemia of chronic disease: Patient's hemoglobin is stable and consistent with her baseline.  There is no clinical indication for blood transfusion on today.  Continue to monitor closely.  Labs in AM.  History of depression: Continue with Celexa.  Patient denies any suicidal or homicidal intentions today.  Continue to follow closely.  DVT Prophylaxis: Subcut Lovenox and SCDs  AM Labs Ordered, also please review Full Orders  Family Communication: Admission, patient's condition and plan of care including tests being ordered have been discussed  with the patient who indicate understanding and agree with the plan and Code Status.  Code Status: Full Code  Consults called: None    Admission status: Inpatient    Time spent in minutes : 30 minutes   [place], MSN, FNP-C [facility] [address] [place], [place] [address] [phone]  10/26/2021 at 3:27 PM

## 2021-10-26 NOTE — ED Provider Notes (Signed)
[facility] DEPT Provider Note   CSN: [id number] Arrival date & time: 10/26/21  1008     History Chief Complaint  Patient presents with   Sickle Cell Pain Crisis    Carmen [name] is a 30 y.o. female.  30 year old female well-known to me presents with results of cell crisis.  I admited  patient to the hospital several days ago and she was discharged 3 days ago.  States that she was still in pain at the time of discharge but tried to go home and managed with her home meds.  States that this has been ineffective.  Denies any new fever, cough, congestion.  No vomiting or diarrhea.  States that her pain is localized throughout her body which is similar to her prior pain exacerbations.      Past Medical History:  Diagnosis Date   Anxiety    Headache(784.0)    Heart murmur    Sickle cell crisis ([place])    Syphilis 2015   Was diagnosed and received one injection of antibiotics   Thrombocytosis 11/22/2014     CBC Latest Ref Rng & Units 12/13/2018 12/11/2018 12/10/2018 WBC 4.0 - 10.5 K/uL 14.8(H) 13.2(H) 15.9(H) Hemoglobin 12.0 - 15.0 g/dL 8.2(L) 7.7(L) 8.1(L) Hematocrit 36.0 - 46.0 % 23.7(L) 23.2(L) 24.5(L) Platelets 150 - 400 K/uL 326 399 388      Patient Active Problem List   Diagnosis Date Noted   Acute chest syndrome due to sickle cell crisis ([place]) 09/05/2021   Chronic pain syndrome 08/21/2021   Moderate episode of recurrent major depressive disorder ([place]) 05/28/2021   Major depressive disorder 05/22/2021   Tobacco abuse 05/21/2021   Sickle cell disease with crisis ([place]) 03/23/2021   Acute pain of right shoulder    Sickle cell anemia with crisis ([place]) 02/16/2021   Community acquired pneumonia of right lower lobe of lung    Fever of unknown origin 10/14/2020   Sickle cell crisis ([place]) 03/31/2020   Bacterial vaginitis 01/04/2020   Acute cystitis without hematuria    Dysuria 12/31/2019   Urine leukocytes    Sickle cell pain crisis ([place]) 09/10/2019    Sickle cell disease ([place]) 12/04/2018   Alpha thalassemia silent carrier 12/04/2018   Chronic prescription opiate use 03/13/2018   Chronic musculoskeletal pain 03/13/2018   Vitamin D deficiency 01/13/2018   Leukocytosis 08/21/2017   Hypokalemia 06/27/2017   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[place]) 04/05/2017   Hb-SS disease without crisis ([place]) 08/15/2016   Anemia of chronic disease    Chest pain 72/53/6644   Systolic ejection murmur 03/47/4259   Sickle cell anemia of mother during pregnancy ([place]) 05/16/2014    Past Surgical History:  Procedure Laterality Date   CESAREAN SECTION N/A 02/05/2019   Procedure: CESAREAN SECTION;  Surgeon: [name], MD;  Location: [facility];  Service: Obstetrics;  Laterality: N/A;   CHOLECYSTECTOMY N/A 11/30/2014   Procedure: LAPAROSCOPIC CHOLECYSTECTOMY SINGLE SITE WITH INTRAOPERATIVE CHOLANGIOGRAM;  Surgeon: Michael Boston, MD;  Location: [facility] ORS;  Service: General;  Laterality: N/A;   SPLENECTOMY       OB History     Gravida  2   Para  1   Term  1   Preterm      AB  1   Living  1      SAB      IAB  1   Ectopic      Multiple  0   Live Births  1  Family History  Problem Relation Age of Onset   Hypertension Mother    Sickle cell anemia Sister    Kidney disease Sister        Lupus   Arthritis Sister    Sickle cell anemia Sister    Sickle cell trait Sister    Heart disease Maternal Aunt        CABG   Heart disease Maternal Uncle        CABG   Lupus Sister     Social History   Tobacco Use   Smoking status: Some Days    Types: Cigarettes   Smokeless tobacco: Never  Vaping Use   Vaping Use: Never used  Substance Use Topics   Alcohol use: No   Drug use: No    Home Medications Prior to Admission medications   Medication Sig Start Date End Date Taking? Authorizing Provider  betamethasone valerate ointment (VALISONE) 0.1 % Apply 1 application topically in the morning and at bedtime. 10/10/21    [provider]  citalopram (CELEXA) 20 MG tablet TAKE 1 TABLET BY MOUTH EVERY DAY Patient taking differently: Take 20 mg by mouth daily as needed (anxiety). 02/24/21   [name], FNP  cyclobenzaprine (FLEXERIL) 10 MG tablet TAKE 1 TABLET BY MOUTH TWICE A DAY Patient taking differently: Take 10 mg by mouth 2 (two) times daily as needed for muscle spasms. 10/12/21   Vevelyn Francois, NP  DULoxetine (CYMBALTA) 30 MG capsule Take 2 capsules (60 mg total) by mouth daily. 10/21/21 04/19/22  Vevelyn Francois, NP  folic acid (FOLVITE) 1 MG tablet Take 1 tablet (1 mg total) by mouth daily. 03/15/19   Lanae Boast, FNP  morphine (MS CONTIN) 60 MG 12 hr tablet Take 1 tablet (60 mg total) by mouth every 12 (twelve) hours. 09/28/21 10/28/21  Vevelyn Francois, NP  Oxycodone HCl 10 MG TABS Take 1 tablet (10 mg total) by mouth every 6 (six) hours as needed for up to 15 days (pain). 10/10/21 10/25/21  Vevelyn Francois, NP  Prenatal Vit-Fe Fumarate-FA (PRENATAL VITAMIN PLUS LOW IRON) 27-1 MG TABS TAKE 1 TABLET BY MOUTH EVERY DAY Patient taking differently: Take 1 tablet by mouth daily. 03/03/20   Shelly Bombard, MD  Vitamin D, Ergocalciferol, (DRISDOL) 1.25 MG (50000 UNIT) CAPS capsule TAKE ONE CAPSULE BY MOUTH ONE TIME PER WEEK Patient taking differently: Take 50,000 Units by mouth every Wednesday. 09/03/20   Vevelyn Francois, NP    Allergies    Ketamine  Review of Systems   Review of Systems  All other systems reviewed and are negative.  Physical Exam Updated Vital Signs BP 124/75 (BP Location: Left Arm)    Pulse (!) 108    Temp 98.8 F (37.1 C) (Oral)    Resp 18    Ht 1.6 m (5\' 3" )    Wt 86.2 kg    LMP 10/13/2021    SpO2 98%    BMI 33.66 kg/m   Physical Exam Vitals and nursing note reviewed.  Constitutional:      General: She is not in acute distress.    Appearance: Normal appearance. She is well-developed. She is not toxic-appearing.  HENT:     Head: Normocephalic and atraumatic.   Eyes:     General: Lids are normal.     Conjunctiva/sclera: Conjunctivae normal.     Pupils: Pupils are equal, round, and reactive to light.  Neck:     Thyroid: No thyroid mass.  Trachea: No tracheal deviation.  Cardiovascular:     Rate and Rhythm: Normal rate and regular rhythm.     Heart sounds: Normal heart sounds. No murmur heard.   No gallop.  Pulmonary:     Effort: Pulmonary effort is normal. No respiratory distress.     Breath sounds: Normal breath sounds. No stridor. No decreased breath sounds, wheezing, rhonchi or rales.  Abdominal:     General: There is no distension.     Palpations: Abdomen is soft.     Tenderness: There is no abdominal tenderness. There is no rebound.  Musculoskeletal:        General: No tenderness. Normal range of motion.     Cervical back: Normal range of motion and neck supple.  Skin:    General: Skin is warm and dry.     Findings: No abrasion or rash.  Neurological:     Mental Status: She is alert and oriented to person, place, and time. Mental status is at baseline.     GCS: GCS eye subscore is 4. GCS verbal subscore is 5. GCS motor subscore is 6.     Cranial Nerves: No cranial nerve deficit.     Sensory: No sensory deficit.     Motor: Motor function is intact.  Psychiatric:        Attention and Perception: Attention normal.        Speech: Speech normal.        Behavior: Behavior normal.    ED Results / Procedures / Treatments   Labs (all labs ordered are listed, but only abnormal results are displayed) Labs Reviewed  CBC WITH DIFFERENTIAL/PLATELET  COMPREHENSIVE METABOLIC PANEL  RETICULOCYTES  I-STAT BETA HCG BLOOD, ED ([facility], [facility], [facility] ONLY)    EKG None  Radiology No results found.  Procedures Procedures   Medications Ordered in ED Medications  0.45 % sodium chloride infusion (has no administration in time range)  HYDROmorphone (DILAUDID) injection 2 mg (has no administration in time range)  HYDROmorphone (DILAUDID)  injection 2 mg (has no administration in time range)  diphenhydrAMINE (BENADRYL) injection 25 mg (has no administration in time range)  ondansetron (ZOFRAN) injection 4 mg (has no administration in time range)    ED Course  I have reviewed the triage vital signs and the nursing notes.  Pertinent labs & imaging results that were available during my care of the patient were reviewed by me and considered in my medical decision making (see chart for details).    MDM Rules/Calculators/A&P                         Patient medicated for pain here and given IV fluids and still is uncomfortable.  Discussed with sickle cell team and they will admit the patient    Final Clinical Impression(s) / ED Diagnoses Final diagnoses:  None    Rx / DC Orders ED Discharge Orders     None        Lacretia Leigh, MD 10/26/21 1502

## 2021-10-26 NOTE — ED Notes (Signed)
Awaiting ultrasound IV team to place line to medicate pt. Pt states understanding.

## 2021-10-27 LAB — CBC
HCT: 23.7 % — ABNORMAL LOW (ref 36.0–46.0)
Hemoglobin: 8.5 g/dL — ABNORMAL LOW (ref 12.0–15.0)
MCH: 29.9 pg (ref 26.0–34.0)
MCHC: 35.9 g/dL (ref 30.0–36.0)
MCV: 83.5 fL (ref 80.0–100.0)
Platelets: 408 10*3/uL — ABNORMAL HIGH (ref 150–400)
RBC: 2.84 MIL/uL — ABNORMAL LOW (ref 3.87–5.11)
RDW: 14.7 % (ref 11.5–15.5)
WBC: 11.9 10*3/uL — ABNORMAL HIGH (ref 4.0–10.5)
nRBC: 0.3 % — ABNORMAL HIGH (ref 0.0–0.2)

## 2021-10-27 LAB — BASIC METABOLIC PANEL
Anion gap: 7 (ref 5–15)
BUN: 18 mg/dL (ref 6–20)
CO2: 26 mmol/L (ref 22–32)
Calcium: 9.1 mg/dL (ref 8.9–10.3)
Chloride: 103 mmol/L (ref 98–111)
Creatinine, Ser: 0.6 mg/dL (ref 0.44–1.00)
GFR, Estimated: >60 mL/min (ref >60)
Glucose, Bld: 78 mg/dL (ref 70–99)
Potassium: 4 mmol/L (ref 3.5–5.1)
Sodium: 136 mmol/L (ref 135–145)

## 2021-10-27 MED ORDER — OXYCODONE HCL 5 MG PO TABS
10.0000 mg | ORAL_TABLET | Freq: Four times a day (QID) | ORAL | Status: DC | PRN
  Administered 2021-10-27 – 2021-10-28 (×3): 10 mg via ORAL
  Filled 2021-10-27 (×3): qty 2

## 2021-10-27 NOTE — Progress Notes (Signed)
Pt will not leave tele leads on and is constantly in bathroom, so unable to maintain telemetry leads in place. Olena Heckle, NP  and telemetry monitoring aware.

## 2021-10-27 NOTE — Progress Notes (Signed)
° ° °  OVERNIGHT PROGRESS REPORT  Notified by RN that patient refuses tele -monitoring. Patient is up and down constantly.     Gershon Cull MSNA MSN ACNPC-AG Acute Care Nurse Practitioner [place]

## 2021-10-27 NOTE — Progress Notes (Signed)
Subjective: Carmen [name] is a 30 year old female with a medical history significant for sickle cell disease, chronic pain syndrome, opiate dependence and tolerance, history of anemia of chronic disease, and history of depression was admitted for sickle cell pain crisis.  Patient continues to complain of allover body pain.  Pain intensity is 8/10.  She denies headache, chest pain, urinary symptoms, nausea, vomiting, or diarrhea.  Objective:  Vital signs in last 24 hours:  Vitals:   10/27/21 0848 10/27/21 0956 10/27/21 1213 10/27/21 1341  BP:  114/69  116/83  Pulse:  70  83  Resp: 18 15 17 13   Temp:  98 F (36.7 C)  98 F (36.7 C)  TempSrc:  Oral  Oral  SpO2: 95% 100% 98% 98%  Weight:      Height:        Intake/Output from previous day:   Intake/Output Summary (Last 24 hours) at 10/27/2021 1535 Last data filed at 10/27/2021 0300 Gross per 24 hour  Intake 549.17 ml  Output --  Net 549.17 ml    Physical Exam: General: Alert, awake, oriented x3, in no acute distress.  HEENT: [place]/AT PEERL, EOMI Neck: Trachea midline,  no masses, no thyromegal,y no JVD, no carotid bruit OROPHARYNX:  Moist, No exudate/ erythema/lesions.  Heart: Regular rate and rhythm, without murmurs, rubs, gallops, PMI non-displaced, no heaves or thrills on palpation.  Lungs: Clear to auscultation, no wheezing or rhonchi noted. No increased vocal fremitus resonant to percussion  Abdomen: Soft, nontender, nondistended, positive bowel sounds, no masses no hepatosplenomegaly noted..  Neuro: No focal neurological deficits noted cranial nerves II through XII grossly intact. DTRs 2+ bilaterally upper and lower extremities. Strength 5 out of 5 in bilateral upper and lower extremities. Musculoskeletal: No warm swelling or erythema around joints, no spinal tenderness noted. Psychiatric: Patient alert and oriented x3, good insight and cognition, good recent to remote recall. Lymph node survey: No cervical axillary or  inguinal lymphadenopathy noted.  Lab Results:  Basic Metabolic Panel:    Component Value Date/Time   NA 136 10/27/2021 0822   NA 142 07/02/2020 1040   K 4.0 10/27/2021 0822   CL 103 10/27/2021 0822   CO2 26 10/27/2021 0822   BUN 18 10/27/2021 0822   BUN 8 07/02/2020 1040   CREATININE 0.60 10/27/2021 0822   GLUCOSE 78 10/27/2021 0822   CALCIUM 9.1 10/27/2021 0822   CBC:    Component Value Date/Time   WBC 11.9 (H) 10/27/2021 0822   HGB 8.5 (L) 10/27/2021 0822   HGB 9.8 (L) 07/02/2020 1040   HCT 23.7 (L) 10/27/2021 0822   HCT 28.9 (L) 07/02/2020 1040   PLT 408 (H) 10/27/2021 0822   PLT 628 (H) 07/02/2020 1040   MCV 83.5 10/27/2021 0822   MCV 88 07/02/2020 1040   NEUTROABS 5.9 10/26/2021 1345   NEUTROABS 3.0 07/02/2020 1040   LYMPHSABS 2.7 10/26/2021 1345   LYMPHSABS 3.5 (H) 07/02/2020 1040   MONOABS 1.6 (H) 10/26/2021 1345   EOSABS 0.3 10/26/2021 1345   EOSABS 0.2 07/02/2020 1040   BASOSABS 0.0 10/26/2021 1345   BASOSABS 0.1 07/02/2020 1040    Recent Results (from the past 240 hour(s))  Resp Panel by RT-PCR (Flu A&B, Covid) Nasopharyngeal Swab     Status: None   Collection Time: 10/26/21  3:12 PM   Specimen: Nasopharyngeal Swab; Nasopharyngeal(NP) swabs in vial transport medium  Result Value Ref Range Status   SARS Coronavirus 2 by RT PCR NEGATIVE NEGATIVE Final    Comment: (  NOTE) SARS-CoV-2 target nucleic acids are NOT DETECTED.  The SARS-CoV-2 RNA is generally detectable in upper respiratory specimens during the acute phase of infection. The lowest concentration of SARS-CoV-2 viral copies this assay can detect is 138 copies/mL. A negative result does not preclude SARS-Cov-2 infection and should not be used as the sole basis for treatment or other patient management decisions. A negative result may occur with  improper specimen collection/handling, submission of specimen other than nasopharyngeal swab, presence of viral mutation(s) within the areas targeted by  this assay, and inadequate number of viral copies(<138 copies/mL). A negative result must be combined with clinical observations, patient history, and epidemiological information. The expected result is Negative.  Fact Sheet for Patients:  EntrepreneurPulse.com.au  Fact Sheet for Healthcare Providers:  IncredibleEmployment.be  This test is no t yet approved or cleared by the [place] FDA and  has been authorized for detection and/or diagnosis of SARS-CoV-2 by FDA under an Emergency Use Authorization (EUA). This EUA will remain  in effect (meaning this test can be used) for the duration of the COVID-19 declaration under Section 564(b)(1) of the Act, 21 U.S.C.section 360bbb-3(b)(1), unless the authorization is terminated  or revoked sooner.       Influenza A by PCR NEGATIVE NEGATIVE Final   Influenza B by PCR NEGATIVE NEGATIVE Final    Comment: (NOTE) The Xpert Xpress SARS-CoV-2/FLU/RSV plus assay is intended as an aid in the diagnosis of influenza from Nasopharyngeal swab specimens and should not be used as a sole basis for treatment. Nasal washings and aspirates are unacceptable for Xpert Xpress SARS-CoV-2/FLU/RSV testing.  Fact Sheet for Patients: EntrepreneurPulse.com.au  Fact Sheet for Healthcare Providers: IncredibleEmployment.be  This test is not yet approved or cleared by the [place] FDA and has been authorized for detection and/or diagnosis of SARS-CoV-2 by FDA under an Emergency Use Authorization (EUA). This EUA will remain in effect (meaning this test can be used) for the duration of the COVID-19 declaration under Section 564(b)(1) of the Act, 21 U.S.C. section 360bbb-3(b)(1), unless the authorization is terminated or revoked.  Performed at [facility], [place] [address] [place], [place] [address]     Studies/Results: No results  found.  Medications: Scheduled Meds:  DULoxetine  60 mg Oral Daily   enoxaparin (LOVENOX) injection  40 mg Subcutaneous Daily   folic acid  1 mg Oral Daily   HYDROmorphone   Intravenous Q4H   ketorolac  15 mg Intravenous Q6H   morphine  60 mg Oral Q12H   senna-docusate  1 tablet Oral BID   Continuous Infusions:  sodium chloride 50 mL/hr at 10/26/21 1637   PRN Meds:.citalopram, diphenhydrAMINE, naloxone **AND** sodium chloride flush, ondansetron (ZOFRAN) IV, polyethylene glycol  Consultants: none  Procedures: none  Antibiotics: none  Assessment/Plan: Principal Problem:   Sickle cell pain crisis (HCC) Active Problems:   Anemia of chronic disease   Chronic pain syndrome   Sickle cell disease with pain crisis: Continue IV Dilaudid PCA without changes today Oxycodone 10 mg every 6 hours as needed for severe breakthrough pain Toradol 15 mg IV every 6 hours for total of 5 days Monitor vital signs very closely, reevaluate pain scale regularly, and supplemental oxygen as needed.  Chronic pain syndrome: Continue home medications  Anemia of chronic disease: Patient's hemoglobin remained stable and consistent with her baseline.  There is no clinical indication for blood transfusion at this time.  Monitor closely.  History of depression: Continue home medications patient denies any suicidal homicidal  intentions today. Code Status: Full Code Family Communication: N/A Disposition Plan: Not yet ready for discharge  [place], MSN, FNP-C Patient [facility] [address] [place], [place] [address] [phone]  If 7PM-7AM, please contact night-coverage.  10/27/2021, 3:35 PM  LOS: 1 day

## 2021-10-28 NOTE — TOC Initial Note (Signed)
Transition of Care [facility]) - Initial/Assessment Note    Patient Details  Name: Carmen [name] MRN: [id number] Date of Birth: 04/13/1991  Transition of Care [facility]) CM/SW Contact:    Lynnell Catalan, RN Phone Number: 10/28/2021, 1:16 PM      Transition of Care (TOC) Screening Note   Patient Details  Name: Carmen [name] Date of Birth: 1991-06-14   Transition of Care [facility]) CM/SW Contact:    Lynnell Catalan, RN Phone Number: 10/28/2021, 1:16 PM    Transition of Care Department (TOC) has reviewed patient and no TOC needs have been identified at this time. We will continue to monitor patient advancement through interdisciplinary progression rounds. If new patient transition needs arise, please place a TOC consult.        Activities of Daily Living Home Assistive Devices/Equipment: None ADL Screening (condition at time of admission) Patient's cognitive ability adequate to safely complete daily activities?: Yes Is the patient deaf or have difficulty hearing?: No Does the patient have difficulty seeing, even when wearing glasses/contacts?: No Does the patient have difficulty concentrating, remembering, or making decisions?: No Patient able to express need for assistance with ADLs?: Yes Does the patient have difficulty dressing or bathing?: No Independently performs ADLs?: Yes (appropriate for developmental age) Does the patient have difficulty walking or climbing stairs?: No Weakness of Legs: None Weakness of Arms/Hands: None   Admission diagnosis:  Sickle cell pain crisis ([place]) [D57.00] Patient Active Problem List   Diagnosis Date Noted   Acute chest syndrome due to sickle cell crisis ([place]) 09/05/2021   Chronic pain syndrome 08/21/2021   Moderate episode of recurrent major depressive disorder ([place]) 05/28/2021   Major depressive disorder 05/22/2021   Tobacco abuse 05/21/2021   Sickle cell disease with crisis ([place]) 03/23/2021   Acute pain of right shoulder    Sickle cell  anemia with crisis ([place]) 02/16/2021   Community acquired pneumonia of right lower lobe of lung    Fever of unknown origin 10/14/2020   Sickle cell crisis ([place]) 03/31/2020   Bacterial vaginitis 01/04/2020   Acute cystitis without hematuria    Dysuria 12/31/2019   Urine leukocytes    Sickle cell pain crisis ([place]) 09/10/2019   Sickle cell disease ([place]) 12/04/2018   Alpha thalassemia silent carrier 12/04/2018   Chronic prescription opiate use 03/13/2018   Chronic musculoskeletal pain 03/13/2018   Vitamin D deficiency 01/13/2018   Leukocytosis 08/21/2017   Hypokalemia 06/27/2017   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[place]) 04/05/2017   Hb-SS disease without crisis ([place]) 08/15/2016   Anemia of chronic disease    Chest pain 40/98/1191   Systolic ejection murmur 47/82/9562   Sickle cell anemia of mother during pregnancy ([place]) 05/16/2014   PCP:  Vevelyn Francois, NP Pharmacy:   [id number] - [place], [place] [address] Phone: [phone] Fax: [phone]     Social Determinants of Health (SDOH) Interventions    Readmission Risk Interventions Readmission Risk Prevention Plan 10/28/2021 10/21/2021 09/07/2021  Transportation Screening - Complete Complete  PCP or Specialist Appt within 3-5 Days - - -  Not Complete comments - - -  HRI or [place] or Home Care Consult comments - - -  Social Work Consult for [place] Planning/Counseling - - -  Palliative Care Screening - - -  Medication Review (RN Care Manager) Complete Complete Complete  PCP or  Specialist appointment within 3-5 days of discharge Complete Complete Complete  HRI or Home Care Consult - Complete Complete  SW Recovery Care/Counseling Consult Complete Complete Complete  Palliative Care Screening Not Applicable Not Applicable Not Applicable  Skilled Nursing Facility Not Applicable Not Applicable Not  Applicable  Some recent data might be hidden

## 2021-10-28 NOTE — Progress Notes (Signed)
Subjective: Carmen [name] is a 30 year old female with a medical history significant for sickle cell disease, chronic pain syndrome, opiate dependence and tolerance, history of anemia of chronic disease, and history of depression was admitted for sickle cell pain crisis.  Patient continues to complain of allover body pain.  Patient says that pain intensity has improved some overnight.  She also states that she cannot manage at home at this time.  She is rating her pain at 6/10.   She denies headache, chest pain, urinary symptoms, nausea, vomiting, or diarrhea.  Objective:  Vital signs in last 24 hours:  Vitals:   10/28/21 0800 10/28/21 0903 10/28/21 0925 10/28/21 1201  BP:  112/75    Pulse:  75    Resp: 18 16 18 18   Temp:  98.5 F (36.9 C)    TempSrc:  Oral    SpO2: 94% 96% 96% 95%  Weight:      Height:        Intake/Output from previous day:   Intake/Output Summary (Last 24 hours) at 10/28/2021 1637 Last data filed at 10/28/2021 0313 Gross per 24 hour  Intake 899.57 ml  Output --  Net 899.57 ml    Physical Exam: General: Alert, awake, oriented x3, in no acute distress.  HEENT: [place]/AT PEERL, EOMI Neck: Trachea midline,  no masses, no thyromegal,y no JVD, no carotid bruit OROPHARYNX:  Moist, No exudate/ erythema/lesions.  Heart: Regular rate and rhythm, without murmurs, rubs, gallops, PMI non-displaced, no heaves or thrills on palpation.  Lungs: Clear to auscultation, no wheezing or rhonchi noted. No increased vocal fremitus resonant to percussion  Abdomen: Soft, nontender, nondistended, positive bowel sounds, no masses no hepatosplenomegaly noted..  Neuro: No focal neurological deficits noted cranial nerves II through XII grossly intact. DTRs 2+ bilaterally upper and lower extremities. Strength 5 out of 5 in bilateral upper and lower extremities. Musculoskeletal: No warm swelling or erythema around joints, no spinal tenderness noted. Psychiatric: Patient alert and oriented  x3, good insight and cognition, good recent to remote recall. Lymph node survey: No cervical axillary or inguinal lymphadenopathy noted.  Lab Results:  Basic Metabolic Panel:    Component Value Date/Time   NA 136 10/27/2021 0822   NA 142 07/02/2020 1040   K 4.0 10/27/2021 0822   CL 103 10/27/2021 0822   CO2 26 10/27/2021 0822   BUN 18 10/27/2021 0822   BUN 8 07/02/2020 1040   CREATININE 0.60 10/27/2021 0822   GLUCOSE 78 10/27/2021 0822   CALCIUM 9.1 10/27/2021 0822   CBC:    Component Value Date/Time   WBC 11.9 (H) 10/27/2021 0822   HGB 8.5 (L) 10/27/2021 0822   HGB 9.8 (L) 07/02/2020 1040   HCT 23.7 (L) 10/27/2021 0822   HCT 28.9 (L) 07/02/2020 1040   PLT 408 (H) 10/27/2021 0822   PLT 628 (H) 07/02/2020 1040   MCV 83.5 10/27/2021 0822   MCV 88 07/02/2020 1040   NEUTROABS 5.9 10/26/2021 1345   NEUTROABS 3.0 07/02/2020 1040   LYMPHSABS 2.7 10/26/2021 1345   LYMPHSABS 3.5 (H) 07/02/2020 1040   MONOABS 1.6 (H) 10/26/2021 1345   EOSABS 0.3 10/26/2021 1345   EOSABS 0.2 07/02/2020 1040   BASOSABS 0.0 10/26/2021 1345   BASOSABS 0.1 07/02/2020 1040    Recent Results (from the past 240 hour(s))  Resp Panel by RT-PCR (Flu A&B, Covid) Nasopharyngeal Swab     Status: None   Collection Time: 10/26/21  3:12 PM   Specimen: Nasopharyngeal Swab; Nasopharyngeal(NP) swabs in  vial transport medium  Result Value Ref Range Status   SARS Coronavirus 2 by RT PCR NEGATIVE NEGATIVE Final    Comment: (NOTE) SARS-CoV-2 target nucleic acids are NOT DETECTED.  The SARS-CoV-2 RNA is generally detectable in upper respiratory specimens during the acute phase of infection. The lowest concentration of SARS-CoV-2 viral copies this assay can detect is 138 copies/mL. A negative result does not preclude SARS-Cov-2 infection and should not be used as the sole basis for treatment or other patient management decisions. A negative result may occur with  improper specimen collection/handling, submission  of specimen other than nasopharyngeal swab, presence of viral mutation(s) within the areas targeted by this assay, and inadequate number of viral copies(<138 copies/mL). A negative result must be combined with clinical observations, patient history, and epidemiological information. The expected result is Negative.  Fact Sheet for Patients:  EntrepreneurPulse.com.au  Fact Sheet for Healthcare Providers:  IncredibleEmployment.be  This test is no t yet approved or cleared by the [place] FDA and  has been authorized for detection and/or diagnosis of SARS-CoV-2 by FDA under an Emergency Use Authorization (EUA). This EUA will remain  in effect (meaning this test can be used) for the duration of the COVID-19 declaration under Section 564(b)(1) of the Act, 21 U.S.C.section 360bbb-3(b)(1), unless the authorization is terminated  or revoked sooner.       Influenza A by PCR NEGATIVE NEGATIVE Final   Influenza B by PCR NEGATIVE NEGATIVE Final    Comment: (NOTE) The Xpert Xpress SARS-CoV-2/FLU/RSV plus assay is intended as an aid in the diagnosis of influenza from Nasopharyngeal swab specimens and should not be used as a sole basis for treatment. Nasal washings and aspirates are unacceptable for Xpert Xpress SARS-CoV-2/FLU/RSV testing.  Fact Sheet for Patients: EntrepreneurPulse.com.au  Fact Sheet for Healthcare Providers: IncredibleEmployment.be  This test is not yet approved or cleared by the [place] FDA and has been authorized for detection and/or diagnosis of SARS-CoV-2 by FDA under an Emergency Use Authorization (EUA). This EUA will remain in effect (meaning this test can be used) for the duration of the COVID-19 declaration under Section 564(b)(1) of the Act, 21 U.S.C. section 360bbb-3(b)(1), unless the authorization is terminated or revoked.  Performed at [facility], [place]  [address] [place], [place] [address]     Studies/Results: No results found.  Medications: Scheduled Meds:  DULoxetine  60 mg Oral Daily   enoxaparin (LOVENOX) injection  40 mg Subcutaneous Daily   folic acid  1 mg Oral Daily   HYDROmorphone   Intravenous Q4H   ketorolac  15 mg Intravenous Q6H   morphine  60 mg Oral Q12H   senna-docusate  1 tablet Oral BID   Continuous Infusions:  sodium chloride 50 mL/hr at 10/27/21 2302   PRN Meds:.citalopram, diphenhydrAMINE, naloxone **AND** sodium chloride flush, ondansetron (ZOFRAN) IV, oxyCODONE, polyethylene glycol  Consultants: none  Procedures: none  Antibiotics: none  Assessment/Plan: Principal Problem:   Sickle cell pain crisis (HCC) Active Problems:   Anemia of chronic disease   Chronic pain syndrome   Sickle cell disease with pain crisis: Continue IV Dilaudid PCA without changes today Oxycodone 10 mg every 6 hours as needed for severe breakthrough pain Toradol 15 mg IV every 6 hours for total of 5 days Monitor vital signs very closely, reevaluate pain scale regularly, and supplemental oxygen as needed.  Chronic pain syndrome: Continue home medications  Anemia of chronic disease: Patient's hemoglobin remained stable and consistent with her baseline.  There  is no clinical indication for blood transfusion at this time.  Monitor closely.  History of depression: Continue home medications patient denies any suicidal homicidal intentions today. Code Status: Full Code Family Communication: N/A Disposition Plan: Not yet ready for discharge  [place], MSN, FNP-C Patient [facility] [address] [place], [place] [address] [phone]  If 7PM-7AM, please contact night-coverage.  10/28/2021, 4:37 PM  LOS: 2 days

## 2021-10-29 DIAGNOSIS — D5700 Hb-SS disease with crisis, unspecified: Secondary | ICD-10-CM | POA: Diagnosis not present

## 2021-10-29 MED ORDER — OXYCODONE HCL 5 MG PO TABS
10.0000 mg | ORAL_TABLET | ORAL | Status: DC
  Administered 2021-10-29 – 2021-10-30 (×4): 10 mg via ORAL
  Filled 2021-10-29 (×5): qty 2

## 2021-10-29 MED ORDER — HYDROMORPHONE 1 MG/ML IV SOLN
INTRAVENOUS | Status: DC
  Administered 2021-10-29: 30 mg via INTRAVENOUS
  Administered 2021-10-29: 8.2 mg via INTRAVENOUS
  Administered 2021-10-30: 14 mg via INTRAVENOUS
  Filled 2021-10-29: qty 30

## 2021-10-29 NOTE — Progress Notes (Signed)
Pt stays in the bathroom for hours during the day.  Checked on pt numerous times, but pt stated, '' I am going through something right now''.  Pt was c/o of stomach hurting , but did not want nurse or tech to come in to check on her visually.  Pt finally came out and appeared fine and cordial.  VS stable.

## 2021-10-29 NOTE — Progress Notes (Signed)
Subjective: Carmen [name] is a 30 year old female with a medical history significant for sickle cell disease, chronic pain syndrome, opiate dependence and tolerance, history of anemia of chronic disease, and history of depression was admitted for sickle cell pain crisis.  Patient continues to complain of allover body pain.  Patient says that pain intensity has improved some overnight.  She also states that she cannot manage at home at this time.  She is rating her pain at 6/10.   She denies headache, chest pain, urinary symptoms, nausea, vomiting, or diarrhea.  Objective:  Vital signs in last 24 hours:  Vitals:   10/29/21 2354 10/30/21 0414 10/30/21 0433 10/30/21 0745  BP:  122/76    Pulse:  88    Resp: 18 18 20 19   Temp:  98.2 F (36.8 C)    TempSrc:  Oral    SpO2: 98% 98% 96% 98%  Weight:      Height:        Intake/Output from previous day:   Intake/Output Summary (Last 24 hours) at 10/30/2021 1405 Last data filed at 10/30/2021 0706 Gross per 24 hour  Intake 1210.89 ml  Output --  Net 1210.89 ml    Physical Exam: General: Alert, awake, oriented x3, in no acute distress.  HEENT: [place]/AT PEERL, EOMI Neck: Trachea midline,  no masses, no thyromegal,y no JVD, no carotid bruit OROPHARYNX:  Moist, No exudate/ erythema/lesions.  Heart: Regular rate and rhythm, without murmurs, rubs, gallops, PMI non-displaced, no heaves or thrills on palpation.  Lungs: Clear to auscultation, no wheezing or rhonchi noted. No increased vocal fremitus resonant to percussion  Abdomen: Soft, nontender, nondistended, positive bowel sounds, no masses no hepatosplenomegaly noted..  Neuro: No focal neurological deficits noted cranial nerves II through XII grossly intact. DTRs 2+ bilaterally upper and lower extremities. Strength 5 out of 5 in bilateral upper and lower extremities. Musculoskeletal: No warm swelling or erythema around joints, no spinal tenderness noted. Psychiatric: Patient alert and oriented  x3, good insight and cognition, good recent to remote recall. Lymph node survey: No cervical axillary or inguinal lymphadenopathy noted.  Lab Results:  Basic Metabolic Panel:    Component Value Date/Time   NA 136 10/27/2021 0822   NA 142 07/02/2020 1040   K 4.0 10/27/2021 0822   CL 103 10/27/2021 0822   CO2 26 10/27/2021 0822   BUN 18 10/27/2021 0822   BUN 8 07/02/2020 1040   CREATININE 0.60 10/27/2021 0822   GLUCOSE 78 10/27/2021 0822   CALCIUM 9.1 10/27/2021 0822   CBC:    Component Value Date/Time   WBC 11.9 (H) 10/27/2021 0822   HGB 8.5 (L) 10/27/2021 0822   HGB 9.8 (L) 07/02/2020 1040   HCT 23.7 (L) 10/27/2021 0822   HCT 28.9 (L) 07/02/2020 1040   PLT 408 (H) 10/27/2021 0822   PLT 628 (H) 07/02/2020 1040   MCV 83.5 10/27/2021 0822   MCV 88 07/02/2020 1040   NEUTROABS 5.9 10/26/2021 1345   NEUTROABS 3.0 07/02/2020 1040   LYMPHSABS 2.7 10/26/2021 1345   LYMPHSABS 3.5 (H) 07/02/2020 1040   MONOABS 1.6 (H) 10/26/2021 1345   EOSABS 0.3 10/26/2021 1345   EOSABS 0.2 07/02/2020 1040   BASOSABS 0.0 10/26/2021 1345   BASOSABS 0.1 07/02/2020 1040    Recent Results (from the past 240 hour(s))  Resp Panel by RT-PCR (Flu A&B, Covid) Nasopharyngeal Swab     Status: None   Collection Time: 10/26/21  3:12 PM   Specimen: Nasopharyngeal Swab; Nasopharyngeal(NP) swabs in  vial transport medium  Result Value Ref Range Status   SARS Coronavirus 2 by RT PCR NEGATIVE NEGATIVE Final    Comment: (NOTE) SARS-CoV-2 target nucleic acids are NOT DETECTED.  The SARS-CoV-2 RNA is generally detectable in upper respiratory specimens during the acute phase of infection. The lowest concentration of SARS-CoV-2 viral copies this assay can detect is 138 copies/mL. A negative result does not preclude SARS-Cov-2 infection and should not be used as the sole basis for treatment or other patient management decisions. A negative result may occur with  improper specimen collection/handling, submission  of specimen other than nasopharyngeal swab, presence of viral mutation(s) within the areas targeted by this assay, and inadequate number of viral copies(<138 copies/mL). A negative result must be combined with clinical observations, patient history, and epidemiological information. The expected result is Negative.  Fact Sheet for Patients:  EntrepreneurPulse.com.au  Fact Sheet for Healthcare Providers:  IncredibleEmployment.be  This test is no t yet approved or cleared by the [place] FDA and  has been authorized for detection and/or diagnosis of SARS-CoV-2 by FDA under an Emergency Use Authorization (EUA). This EUA will remain  in effect (meaning this test can be used) for the duration of the COVID-19 declaration under Section 564(b)(1) of the Act, 21 U.S.C.section 360bbb-3(b)(1), unless the authorization is terminated  or revoked sooner.       Influenza A by PCR NEGATIVE NEGATIVE Final   Influenza B by PCR NEGATIVE NEGATIVE Final    Comment: (NOTE) The Xpert Xpress SARS-CoV-2/FLU/RSV plus assay is intended as an aid in the diagnosis of influenza from Nasopharyngeal swab specimens and should not be used as a sole basis for treatment. Nasal washings and aspirates are unacceptable for Xpert Xpress SARS-CoV-2/FLU/RSV testing.  Fact Sheet for Patients: EntrepreneurPulse.com.au  Fact Sheet for Healthcare Providers: IncredibleEmployment.be  This test is not yet approved or cleared by the [place] FDA and has been authorized for detection and/or diagnosis of SARS-CoV-2 by FDA under an Emergency Use Authorization (EUA). This EUA will remain in effect (meaning this test can be used) for the duration of the COVID-19 declaration under Section 564(b)(1) of the Act, 21 U.S.C. section 360bbb-3(b)(1), unless the authorization is terminated or revoked.  Performed at [facility], [place]  [address], [place],  [address]     Studies/Results: No results found.  Medications: Scheduled Meds:  DULoxetine  60 mg Oral Daily   enoxaparin (LOVENOX) injection  40 mg Subcutaneous Daily   folic acid  1 mg Oral Daily   HYDROmorphone   Intravenous Q4H   ketorolac  15 mg Intravenous Q6H   morphine  60 mg Oral Q12H   oxyCODONE  10 mg Oral Q4H while awake   senna-docusate  1 tablet Oral BID   Continuous Infusions:  sodium chloride 10 mL/hr at 10/30/21 0706   PRN Meds:.citalopram, diphenhydrAMINE, naloxone **AND** sodium chloride flush, ondansetron (ZOFRAN) IV, polyethylene glycol  Consultants: none  Procedures: none  Antibiotics: none  Assessment/Plan: Principal Problem:   Sickle cell pain crisis (HCC) Active Problems:   Anemia of chronic disease   Chronic pain syndrome   Sickle cell disease with pain crisis: Continue IV Dilaudid PCA without changes today Oxycodone 10 mg every 6 hours as needed for severe breakthrough pain Toradol 15 mg IV every 6 hours for total of 5 days Monitor vital signs very closely, reevaluate pain scale regularly, and supplemental oxygen as needed.  Chronic pain syndrome: Continue home medications  Anemia of chronic disease: Patient's hemoglobin  remained stable and consistent with her baseline.  There is no clinical indication for blood transfusion at this time.  Monitor closely.  History of depression: Continue home medications patient denies any suicidal homicidal intentions today. Code Status: Full Code Family Communication: N/A Disposition Plan: Not yet ready for discharge  [place], MSN, FNP-C [facility] [address] [place], [place] [address] [phone]  If 5PM-8AM, please contact night-coverage.  10/30/2021, 2:05 PM  LOS: 4 days

## 2021-10-30 DIAGNOSIS — D5700 Hb-SS disease with crisis, unspecified: Secondary | ICD-10-CM | POA: Diagnosis not present

## 2021-10-30 NOTE — Discharge Summary (Signed)
Physician Discharge Summary  WINONA SISON UTM:[id number] DOB: 10-24-1991 DOA: 10/26/2021  PCP: Vevelyn Francois, NP  Admit date: 10/26/2021  Discharge date: 10/30/2021  Discharge Diagnoses:  Principal Problem:   Sickle cell pain crisis ([place]) Active Problems:   Anemia of chronic disease   Chronic pain syndrome   Discharge Condition: Stable  Disposition:   Follow-up Information     Vevelyn Francois, NP Follow up.   Specialty: Adult Health Nurse Practitioner Contact information: [address] [name] [address] [phone]                Pt is discharged home in good condition and is to follow up with Vevelyn Francois, NP this week to have labs evaluated. CARLEENA MIRES is instructed to increase activity slowly and balance with rest for the next few days, and use prescribed medication to complete treatment of pain  Diet: Regular Wt Readings from Last 3 Encounters:  10/26/21 86.2 kg  10/20/21 86.2 kg  09/05/21 81.6 kg    History of present illness:  Malley Hauter  is a 30 y.o. female with a medical history significant for sickle cell disease, chronic pain syndrome, opiate dependence and tolerance, history of depression, and history of anemia of chronic disease presented to the emergency department with complaints of low back and lower extremity pain that is consistent with her typical sickle cell crisis.  Patient attributes current pain crisis to changes in weather.  She was admitted to inpatient services last week for this problem and states that she continued to have increased pain at discharge, however she wanted to celebrate the holidays with her family.  Her pain intensity is 8/10 characterized as constant and throbbing.  Patient last had MS Contin and oxycodone this a.m. without sustained relief.  She denies any blurry vision, headaches, fever, chill, or dizziness.  No nausea, vomiting, diarrhea, or urinary symptoms.  No sick contacts, recent travel, or known  exposure to COVID-19.   While in ER: Blood pressure 115/67, temperature 98.3 F, pulse rate 75, and respirations 14.  Oxygen saturation 97% on room air.  Complete blood count: WBCs 10.6, hemoglobin 10 g/dL, and platelet 486,000.  Comprehensive metabolic panel: AST 37, ALT 15, and total bilirubin 1.3, otherwise unremarkable.  COVID-19 and influenza tests negative.  Pain persists despite IV Dilaudid and IV Toradol.  Patient thereby admitted to inpatient services for sickle cell pain crisis.  Hospital Course:  Sickle cell disease with pain crisis: Patient was admitted for sickle cell pain crisis and managed appropriately with IVF, IV Dilaudid via PCA and IV Toradol, as well as other adjunct therapies per sickle cell pain management protocols.  Pain intensity slow to respond to pain medication regimen.  IV Dilaudid PCA weaned appropriately.  Patient transition to her home medication.  She states that pain intensity is 5/10 and is requesting discharge home. Patient is aware of all upcoming appointments.  She is alert, oriented, and ambulating without assistance.  Patient was therefore discharged home today in a hemodynamically stable condition.   Trenisha will follow-up with PCP within 1 week of this discharge. Sura was counseled extensively about nonpharmacologic means of pain management, patient verbalized understanding and was appreciative of  the care received during this admission.   We discussed the need for good hydration, monitoring of hydration status, avoidance of heat, cold, stress, and infection triggers. We discussed the need to be adherent with taking home medications. Patient was reminded of the need to seek  medical attention immediately if any symptom of bleeding, anemia, or infection occurs.  Discharge Exam: Vitals:   10/30/21 0433 10/30/21 0745  BP:    Pulse:    Resp: 20 19  Temp:    SpO2: 96% 98%   Vitals:   10/29/21 2354 10/30/21 0414 10/30/21 0433 10/30/21 0745  BP:  122/76     Pulse:  88    Resp: 18 18 20 19   Temp:  98.2 F (36.8 C)    TempSrc:  Oral    SpO2: 98% 98% 96% 98%  Weight:      Height:        General appearance : Awake, alert, not in any distress. Speech Clear. Not toxic looking HEENT: Atraumatic and Normocephalic, pupils equally reactive to light and accomodation Neck: Supple, no JVD. No cervical lymphadenopathy.  Chest: Good air entry bilaterally, no added sounds  CVS: S1 S2 regular, no murmurs.  Abdomen: Bowel sounds present, Non tender and not distended with no gaurding, rigidity or rebound. Extremities: B/L Lower Ext shows no edema, both legs are warm to touch Neurology: Awake alert, and oriented X 3, CN II-XII intact, Non focal Skin: No Rash  Discharge Instructions  Discharge Instructions     Discharge patient   Complete by: As directed    Discharge disposition: 01-Home or Self Care   Discharge patient date: 10/30/2021      Allergies as of 10/30/2021       Reactions   Ketamine Hives, Other (See Comments)   Makes feel funny        Medication List     STOP taking these medications    morphine 60 MG 12 hr tablet Commonly known as: MS Contin       TAKE these medications    betamethasone valerate ointment 0.1 % Commonly known as: VALISONE Apply 1 application topically in the morning and at bedtime.   citalopram 20 MG tablet Commonly known as: CELEXA TAKE 1 TABLET BY MOUTH EVERY DAY What changed:  when to take this reasons to take this   cyclobenzaprine 10 MG tablet Commonly known as: FLEXERIL TAKE 1 TABLET BY MOUTH TWICE A DAY What changed:  when to take this reasons to take this   DULoxetine 30 MG capsule Commonly known as: CYMBALTA Take 2 capsules (60 mg total) by mouth daily.   folic acid 1 MG tablet Commonly known as: FOLVITE Take 1 tablet (1 mg total) by mouth daily.   Oxycodone HCl 10 MG Tabs Take 10 mg by mouth every 6 (six) hours as needed (pain).   Prenatal Vitamin Plus Low Iron 27-1  MG Tabs TAKE 1 TABLET BY MOUTH EVERY DAY   Vitamin D (Ergocalciferol) 1.25 MG (50000 UNIT) Caps capsule Commonly known as: DRISDOL TAKE ONE CAPSULE BY MOUTH ONE TIME PER WEEK What changed:  how much to take how to take this when to take this additional instructions        The results of significant diagnostics from this hospitalization (including imaging, microbiology, ancillary and laboratory) are listed below for reference.    Significant Diagnostic Studies: No results found.  Microbiology: Recent Results (from the past 240 hour(s))  Resp Panel by RT-PCR (Flu A&B, Covid) Nasopharyngeal Swab     Status: None   Collection Time: 10/26/21  3:12 PM   Specimen: Nasopharyngeal Swab; Nasopharyngeal(NP) swabs in vial transport medium  Result Value Ref Range Status   SARS Coronavirus 2 by RT PCR NEGATIVE NEGATIVE Final    Comment: (NOTE)  SARS-CoV-2 target nucleic acids are NOT DETECTED.  The SARS-CoV-2 RNA is generally detectable in upper respiratory specimens during the acute phase of infection. The lowest concentration of SARS-CoV-2 viral copies this assay can detect is 138 copies/mL. A negative result does not preclude SARS-Cov-2 infection and should not be used as the sole basis for treatment or other patient management decisions. A negative result may occur with  improper specimen collection/handling, submission of specimen other than nasopharyngeal swab, presence of viral mutation(s) within the areas targeted by this assay, and inadequate number of viral copies(<138 copies/mL). A negative result must be combined with clinical observations, patient history, and epidemiological information. The expected result is Negative.  Fact Sheet for Patients:  EntrepreneurPulse.com.au  Fact Sheet for Healthcare Providers:  IncredibleEmployment.be  This test is no t yet approved or cleared by the [place] FDA and  has been authorized for  detection and/or diagnosis of SARS-CoV-2 by FDA under an Emergency Use Authorization (EUA). This EUA will remain  in effect (meaning this test can be used) for the duration of the COVID-19 declaration under Section 564(b)(1) of the Act, 21 U.S.C.section 360bbb-3(b)(1), unless the authorization is terminated  or revoked sooner.       Influenza A by PCR NEGATIVE NEGATIVE Final   Influenza B by PCR NEGATIVE NEGATIVE Final    Comment: (NOTE) The Xpert Xpress SARS-CoV-2/FLU/RSV plus assay is intended as an aid in the diagnosis of influenza from Nasopharyngeal swab specimens and should not be used as a sole basis for treatment. Nasal washings and aspirates are unacceptable for Xpert Xpress SARS-CoV-2/FLU/RSV testing.  Fact Sheet for Patients: EntrepreneurPulse.com.au  Fact Sheet for Healthcare Providers: IncredibleEmployment.be  This test is not yet approved or cleared by the [place] FDA and has been authorized for detection and/or diagnosis of SARS-CoV-2 by FDA under an Emergency Use Authorization (EUA). This EUA will remain in effect (meaning this test can be used) for the duration of the COVID-19 declaration under Section 564(b)(1) of the Act, 21 U.S.C. section 360bbb-3(b)(1), unless the authorization is terminated or revoked.  Performed at [facility], [place] [address] [place], [place] [address]      Labs: Basic Metabolic Panel: Recent Labs  Lab 10/26/21 1345 10/27/21 0822  NA 134* 136  K 4.4 4.0  CL 105 103  CO2 24 26  GLUCOSE 98 78  BUN 15 18  CREATININE 0.63 0.60  CALCIUM 9.3 9.1   Liver Function Tests: Recent Labs  Lab 10/26/21 1345  AST 37  ALT 50*  ALKPHOS 65  BILITOT 1.3*  PROT 8.6*  ALBUMIN 4.4   No results for input(s): LIPASE, AMYLASE in the last 168 hours. No results for input(s): AMMONIA in the last 168 hours. CBC: Recent Labs  Lab 10/26/21 1345 10/27/21 0822  WBC 10.6* 11.9*   NEUTROABS 5.9  --   HGB 10.0* 8.5*  HCT 27.7* 23.7*  MCV 82.2 83.5  PLT 486* 408*   Cardiac Enzymes: No results for input(s): CKTOTAL, CKMB, CKMBINDEX, TROPONINI in the last 168 hours. BNP: Invalid input(s): POCBNP CBG: No results for input(s): GLUCAP in the last 168 hours.  Time coordinating discharge: 30 minutes  Signed:  [name]  APRN, MSN, FNP-C Patient [facility] [address] [place],  [address] [phone]  [facility] 10/30/2021, 9:26 AM

## 2021-10-31 ENCOUNTER — Emergency Department (HOSPITAL_COMMUNITY): Payer: Medicaid Other

## 2021-10-31 ENCOUNTER — Encounter (HOSPITAL_COMMUNITY): Payer: Self-pay | Admitting: Emergency Medicine

## 2021-10-31 ENCOUNTER — Inpatient Hospital Stay (HOSPITAL_COMMUNITY)
Admission: EM | Admit: 2021-10-31 | Discharge: 2021-11-04 | DRG: 812 | Disposition: A | Discharge status: Home / Self Care | Payer: Medicaid Other | Attending: Internal Medicine | Admitting: Internal Medicine

## 2021-10-31 ENCOUNTER — Other Ambulatory Visit: Payer: Self-pay

## 2021-10-31 DIAGNOSIS — D5700 Hb-SS disease with crisis, unspecified: Principal | ICD-10-CM | POA: Diagnosis present

## 2021-10-31 DIAGNOSIS — D638 Anemia in other chronic diseases classified elsewhere: Secondary | ICD-10-CM | POA: Diagnosis present

## 2021-10-31 DIAGNOSIS — Z8249 Family history of ischemic heart disease and other diseases of the circulatory system: Secondary | ICD-10-CM

## 2021-10-31 DIAGNOSIS — Z8261 Family history of arthritis: Secondary | ICD-10-CM

## 2021-10-31 DIAGNOSIS — G894 Chronic pain syndrome: Secondary | ICD-10-CM | POA: Diagnosis present

## 2021-10-31 DIAGNOSIS — Z832 Family history of diseases of the blood and blood-forming organs and certain disorders involving the immune mechanism: Secondary | ICD-10-CM

## 2021-10-31 DIAGNOSIS — F17210 Nicotine dependence, cigarettes, uncomplicated: Secondary | ICD-10-CM | POA: Diagnosis present

## 2021-10-31 DIAGNOSIS — F32A Depression, unspecified: Secondary | ICD-10-CM | POA: Diagnosis present

## 2021-10-31 DIAGNOSIS — Z841 Family history of disorders of kidney and ureter: Secondary | ICD-10-CM

## 2021-10-31 DIAGNOSIS — F419 Anxiety disorder, unspecified: Secondary | ICD-10-CM | POA: Diagnosis present

## 2021-10-31 DIAGNOSIS — E559 Vitamin D deficiency, unspecified: Secondary | ICD-10-CM | POA: Diagnosis present

## 2021-10-31 DIAGNOSIS — F1120 Opioid dependence, uncomplicated: Secondary | ICD-10-CM | POA: Diagnosis present

## 2021-10-31 DIAGNOSIS — Z79899 Other long term (current) drug therapy: Secondary | ICD-10-CM

## 2021-10-31 DIAGNOSIS — Z888 Allergy status to other drugs, medicaments and biological substances status: Secondary | ICD-10-CM

## 2021-10-31 DIAGNOSIS — R451 Restlessness and agitation: Secondary | ICD-10-CM | POA: Diagnosis not present

## 2021-10-31 DIAGNOSIS — Z20822 Contact with and (suspected) exposure to covid-19: Secondary | ICD-10-CM | POA: Diagnosis present

## 2021-10-31 DIAGNOSIS — R519 Headache, unspecified: Secondary | ICD-10-CM | POA: Diagnosis present

## 2021-10-31 MED ORDER — HYDROMORPHONE HCL 2 MG/ML IJ SOLN
2.0000 mg | INTRAMUSCULAR | Status: AC
  Administered 2021-11-01: 2 mg via INTRAVENOUS
  Filled 2021-10-31: qty 1

## 2021-10-31 MED ORDER — HYDROMORPHONE HCL 2 MG/ML IJ SOLN
2.0000 mg | INTRAMUSCULAR | Status: AC
  Administered 2021-10-31: 2 mg via INTRAVENOUS
  Filled 2021-10-31: qty 1

## 2021-10-31 MED ORDER — HYDROMORPHONE HCL 2 MG/ML IJ SOLN
2.0000 mg | Freq: Once | INTRAMUSCULAR | Status: AC
  Administered 2021-10-31: 2 mg via INTRAVENOUS
  Filled 2021-10-31: qty 1

## 2021-10-31 MED ORDER — KETOROLAC TROMETHAMINE 15 MG/ML IJ SOLN
15.0000 mg | INTRAMUSCULAR | Status: AC
  Administered 2021-10-31: 15 mg via INTRAVENOUS
  Filled 2021-10-31: qty 1

## 2021-10-31 MED ORDER — ONDANSETRON 8 MG PO TBDP
8.0000 mg | ORAL_TABLET | Freq: Once | ORAL | Status: AC
  Administered 2021-10-31: 8 mg via ORAL
  Filled 2021-10-31: qty 1

## 2021-10-31 MED ORDER — MORPHINE SULFATE 30 MG PO TABS
30.0000 mg | ORAL_TABLET | Freq: Once | ORAL | Status: AC
  Administered 2021-10-31: 30 mg via ORAL
  Filled 2021-10-31: qty 1

## 2021-10-31 MED ORDER — DIPHENHYDRAMINE HCL 50 MG/ML IJ SOLN
25.0000 mg | Freq: Once | INTRAMUSCULAR | Status: AC
  Administered 2021-10-31: 25 mg via INTRAVENOUS
  Filled 2021-10-31: qty 1

## 2021-10-31 MED ORDER — HYDROMORPHONE HCL 1 MG/ML IJ SOLN: 1.0000 mg | Freq: Once | INTRAMUSCULAR | Status: DC

## 2021-10-31 MED ORDER — ONDANSETRON HCL 4 MG/2ML IJ SOLN
4.0000 mg | INTRAMUSCULAR | Status: DC | PRN
  Administered 2021-10-31: 4 mg via INTRAVENOUS
  Filled 2021-10-31: qty 2

## 2021-10-31 NOTE — ED Provider Notes (Signed)
[facility] DEPT Provider Note   CSN: [id number] Arrival date & time: 10/31/21  1824     History Chief Complaint  Patient presents with   Sickle Cell Pain Crisis    Carmen [name] is a 30 y.o. female.  HPI She presents for evaluation of painful condition, chronic pain and sickle cell anemia.  She takes chronic narcotic therapy and usually receives refills on her short acting oxycodone, every 15 days and her long-acting morphine, every 30 days.  Last oxycodone refill was 10/10/2021 #60.  Last morphine refill 09/28/2021 #60.  She is out of both of those medicines.  Patient says she has been trying to get refills but been unable to.  She states that she "just needs something to get comfortable."  I offered to write her prescriptions for her outpatient medicines and she stated that "I am locked in, no one else can give me a prescription."    Past Medical History:  Diagnosis Date   Anxiety    Headache(784.0)    Heart murmur    Sickle cell crisis ([place])    Syphilis 2015   Was diagnosed and received one injection of antibiotics   Thrombocytosis 11/22/2014     CBC Latest Ref Rng & Units 12/13/2018 12/11/2018 12/10/2018 WBC 4.0 - 10.5 K/uL 14.8(H) 13.2(H) 15.9(H) Hemoglobin 12.0 - 15.0 g/dL 8.2(L) 7.7(L) 8.1(L) Hematocrit 36.0 - 46.0 % 23.7(L) 23.2(L) 24.5(L) Platelets 150 - 400 K/uL 326 399 388      Patient Active Problem List   Diagnosis Date Noted   Acute chest syndrome due to sickle cell crisis ([place]) 09/05/2021   Chronic pain syndrome 08/21/2021   Moderate episode of recurrent major depressive disorder ([place]) 05/28/2021   Major depressive disorder 05/22/2021   Tobacco abuse 05/21/2021   Sickle cell disease with crisis ([place]) 03/23/2021   Acute pain of right shoulder    Sickle cell anemia with crisis ([place]) 02/16/2021   Community acquired pneumonia of right lower lobe of lung    Fever of unknown origin 10/14/2020   Sickle cell crisis ([place]) 03/31/2020    Bacterial vaginitis 01/04/2020   Acute cystitis without hematuria    Dysuria 12/31/2019   Urine leukocytes    Sickle cell pain crisis ([place]) 09/10/2019   Sickle cell disease ([place]) 12/04/2018   Alpha thalassemia silent carrier 12/04/2018   Chronic prescription opiate use 03/13/2018   Chronic musculoskeletal pain 03/13/2018   Vitamin D deficiency 01/13/2018   Leukocytosis 08/21/2017   Hypokalemia 06/27/2017   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[place]) 04/05/2017   Hb-SS disease without crisis ([place]) 08/15/2016   Anemia of chronic disease    Chest pain 62/12/5595   Systolic ejection murmur 41/63/8453   Sickle cell anemia of mother during pregnancy ([place]) 05/16/2014    Past Surgical History:  Procedure Laterality Date   CESAREAN SECTION N/A 02/05/2019   Procedure: CESAREAN SECTION;  Surgeon: [name], MD;  Location: [facility];  Service: Obstetrics;  Laterality: N/A;   CHOLECYSTECTOMY N/A 11/30/2014   Procedure: LAPAROSCOPIC CHOLECYSTECTOMY SINGLE SITE WITH INTRAOPERATIVE CHOLANGIOGRAM;  Surgeon: Michael Boston, MD;  Location: [facility] ORS;  Service: General;  Laterality: N/A;   SPLENECTOMY       OB History     Gravida  2   Para  1   Term  1   Preterm      AB  1   Living  1      SAB      IAB  1   Ectopic      Multiple  0   Live Births  1           Family History  Problem Relation Age of Onset   Hypertension Mother    Sickle cell anemia Sister    Kidney disease Sister        Lupus   Arthritis Sister    Sickle cell anemia Sister    Sickle cell trait Sister    Heart disease Maternal Aunt        CABG   Heart disease Maternal Uncle        CABG   Lupus Sister     Social History   Tobacco Use   Smoking status: Some Days    Types: Cigarettes   Smokeless tobacco: Never  Vaping Use   Vaping Use: Never used  Substance Use Topics   Alcohol use: No   Drug use: No    Home Medications Prior to Admission medications   Medication Sig Start Date End  Date Taking? Authorizing Provider  betamethasone valerate ointment (VALISONE) 0.1 % Apply 1 application topically in the morning and at bedtime. 10/10/21   [provider]  citalopram (CELEXA) 20 MG tablet TAKE 1 TABLET BY MOUTH EVERY DAY Patient taking differently: Take 20 mg by mouth daily as needed (anxiety). 02/24/21   [name], FNP  cyclobenzaprine (FLEXERIL) 10 MG tablet TAKE 1 TABLET BY MOUTH TWICE A DAY Patient taking differently: Take 10 mg by mouth 2 (two) times daily as needed for muscle spasms. 10/12/21   Vevelyn Francois, NP  DULoxetine (CYMBALTA) 30 MG capsule Take 2 capsules (60 mg total) by mouth daily. 10/21/21 04/19/22  Vevelyn Francois, NP  folic acid (FOLVITE) 1 MG tablet Take 1 tablet (1 mg total) by mouth daily. 03/15/19   Lanae Boast, FNP  Oxycodone HCl 10 MG TABS Take 10 mg by mouth every 6 (six) hours as needed (pain).    [provider]  Prenatal Vit-Fe Fumarate-FA (PRENATAL VITAMIN PLUS LOW IRON) 27-1 MG TABS TAKE 1 TABLET BY MOUTH EVERY DAY Patient taking differently: Take 1 tablet by mouth daily. 03/03/20   Shelly Bombard, MD  Vitamin D, Ergocalciferol, (DRISDOL) 1.25 MG (50000 UNIT) CAPS capsule TAKE ONE CAPSULE BY MOUTH ONE TIME PER WEEK Patient taking differently: Take 50,000 Units by mouth every Wednesday. 09/03/20   Vevelyn Francois, NP    Allergies    Ketamine  Review of Systems   Review of Systems  All other systems reviewed and are negative.  Physical Exam Updated Vital Signs BP 120/74    Pulse 90    Temp 98.7 F (37.1 C) (Oral)    Resp 19    LMP 10/13/2021    SpO2 99%   Physical Exam Vitals and nursing note reviewed.  Constitutional:      General: She is not in acute distress.    Appearance: She is well-developed. She is not ill-appearing, toxic-appearing or diaphoretic.  HENT:     Head: Normocephalic and atraumatic.     Right Ear: External ear normal.     Left Ear: External ear normal.  Eyes:     Conjunctiva/sclera:  Conjunctivae normal.     Pupils: Pupils are equal, round, and reactive to light.  Neck:     Trachea: Phonation normal.  Cardiovascular:     Rate and Rhythm: Normal rate.  Pulmonary:     Effort: Pulmonary effort is normal.  Abdominal:  General: There is no distension.  Musculoskeletal:        General: Normal range of motion.     Cervical back: Normal range of motion and neck supple.  Skin:    General: Skin is warm and dry.  Neurological:     Mental Status: She is alert and oriented to person, place, and time.     Cranial Nerves: No cranial nerve deficit.     Sensory: No sensory deficit.     Motor: No abnormal muscle tone.     Coordination: Coordination normal.  Psychiatric:        Mood and Affect: Mood normal.        Behavior: Behavior normal.        Thought Content: Thought content normal.        Judgment: Judgment normal.    ED Results / Procedures / Treatments   Labs (all labs ordered are listed, but only abnormal results are displayed) Labs Reviewed  HCG, QUANTITATIVE, PREGNANCY  RETICULOCYTES  CBC WITH DIFFERENTIAL/PLATELET  COMPREHENSIVE METABOLIC PANEL  I-STAT BETA HCG BLOOD, ED ([facility], [facility], [facility] ONLY)    EKG None  Radiology DG Chest 2 View  Result Date: 10/31/2021 CLINICAL DATA:  Sickle cell crisis.  Shortness of breath. EXAM: CHEST - 2 VIEW COMPARISON:  Chest x-ray 06/19/2021. FINDINGS: The heart size and mediastinal contours are within normal limits. Both lungs are clear. The visualized skeletal structures are unremarkable. IMPRESSION: No active cardiopulmonary disease. Electronically Signed   By: Ronney Asters M.D.   On: 10/31/2021 19:41    Procedures Procedures   Medications Ordered in ED Medications  ketorolac (TORADOL) 15 MG/ML injection 15 mg (has no administration in time range)  HYDROmorphone (DILAUDID) injection 2 mg (has no administration in time range)  HYDROmorphone (DILAUDID) injection 2 mg (has no administration in time range)   diphenhydrAMINE (BENADRYL) injection 25 mg (has no administration in time range)  ondansetron (ZOFRAN) injection 4 mg (has no administration in time range)  morphine (MSIR) tablet 30 mg (has no administration in time range)  ondansetron (ZOFRAN-ODT) disintegrating tablet 8 mg (8 mg Oral Given 10/31/21 2049)  HYDROmorphone (DILAUDID) injection 2 mg (2 mg Intravenous Given 10/31/21 2053)    ED Course  I have reviewed the triage vital signs and the nursing notes.  Pertinent labs & imaging results that were available during my care of the patient were reviewed by me and considered in my medical decision making (see chart for details).  Clinical Course as of 10/31/21 2307  Sat Oct 31, 2021  2145 Patient had absolutely no relief initial treatment with IM Dilaudid.  She is tearful, upset and requested additional treatment [EW]  2303 Plan to check labs, reassess pain and may need admit [DW]    Clinical Course User Index [DW] Ripley Fraise, MD [EW] Daleen Bo, MD   MDM Rules/Calculators/A&P                          Patient Vitals for the past 24 hrs:  BP Temp Temp src Pulse Resp SpO2  10/31/21 2230 120/74 -- -- 90 19 99 %  10/31/21 2205 127/90 -- -- (!) 103 17 100 %  10/31/21 2100 109/85 -- -- (!) 119 15 100 %  10/31/21 2030 109/85 -- -- 98 (!) 25 98 %  10/31/21 1949 116/85 -- -- (!) 102 16 97 %  10/31/21 1858 (!) 131/119 -- -- (!) 122 20 98 %  10/31/21 1856 --  98.7 F (37.1 C) Oral -- -- --     Medical Decision Making: Summary: She presents for pain, which she attributes to her sickle cell disease.  Patient relates to being ill, constantly for 2 weeks despite treatment including hospitalization.  She is apparently out of her usual medicines, but states that she has only been out for 1 day.  Critical Interventions-laboratory testing, radiography; to evaluate  Chief Complaint  Patient presents with   Sickle Cell Pain Crisis    and assess for illness characterized as Acute  and Complicated   After These Interventions, the Patient was reevaluated and was found to have persistent pain after initial IM injection of Dilaudid.  She required additional treatment  This patient is Presenting for Evaluation of sickle cell crisis with pain, which does require a range of treatment options, and is a complaint that involves a moderate risk of morbidity and mortality. The Differential Diagnoses include sickle cell crisis, chronic pain, anxiety. I decided to review pertinent External Data, and in summary hospital admission for 5 days with discharge yesterday, managed for sickle cell crisis, anemia of chronic disease and chronic pain syndrome..  I did not require additional Historical Information from anyone, as the patient is lucid.  Clinical Laboratory Tests Ordered, included CBC, Metabolic panel, and Pregnancy test.   Radiologic Tests Ordered, included chest x-ray.  I independently Visualized: Radiograph images, which show no infiltrate or edema   Pharmaceutical Risk Management treatment chronic pain and sickle cell crisis Parenteral Treatment   CRITICAL CARE-no Performed by: Daleen Bo  Nursing Notes Reviewed/ Care Coordinated Applicable Imaging Reviewed Interpretation of Laboratory Data incorporated into ED treatment  Care to include Dr. Christy Gentles to manage after treatment       Final Clinical Impression(s) / ED Diagnoses Final diagnoses:  Sickle cell pain crisis [facility])    Rx / DC Orders ED Discharge Orders     None        Daleen Bo, MD 10/31/21 2308

## 2021-10-31 NOTE — Progress Notes (Signed)
Set up ultrasound.  MD cancelled iv order prior to stick.

## 2021-10-31 NOTE — ED Triage Notes (Signed)
Pt reports SCC x2weeks. Pt reports being in and out of hospital due to pain. Pt reports pain all over body and in head.

## 2021-10-31 NOTE — ED Provider Notes (Signed)
Emergency Medicine Provider Triage Evaluation Note  Carmen [name] , a 30 y.o. female  was evaluated in triage.  Pt complains of sickle cell crisis for the last week, discharged yesterday, out of her home medications and due to sickle cell clinic being closed 12 days unable to get refills.  Endorses pain in her bilateral legs, right arm, anterior chest.  Review of Systems  Positive: Chest pain, leg pain, arm pain consistent with sickle cell Negative: Shortness of breath, palpitations, fevers  Physical Exam  BP (!) 131/119    Pulse (!) 122    Temp 98.7 F (37.1 C) (Oral)    Resp 20    LMP 10/13/2021    SpO2 98%  Gen:   Awake, no distress   Resp:  Normal effort  MSK:   Moves extremities without difficulty  Other:  Tachycardic with regular rhythm.  Lungs CTA B.  Neurovascularly intact in all 4 extremities.  Medical Decision Making  Medically screening exam initiated at 7:04 PM.  Appropriate orders placed.  ZEN FELLING was informed that the remainder of the evaluation will be completed by another provider, this initial triage assessment does not replace that evaluation, and the importance of remaining in the ED until their evaluation is complete.  This chart was dictated using voice recognition software, Dragon. Despite the best efforts of this provider to proofread and correct errors, errors may still occur which can change documentation meaning.    Aura Dials 10/31/21 1905    Daleen Bo, MD 10/31/21 2241

## 2021-10-31 NOTE — ED Notes (Signed)
Dr. Eulis Foster verbal order to give IM 2mg  Dilaudid.

## 2021-11-01 DIAGNOSIS — R451 Restlessness and agitation: Secondary | ICD-10-CM | POA: Diagnosis not present

## 2021-11-01 DIAGNOSIS — F419 Anxiety disorder, unspecified: Secondary | ICD-10-CM | POA: Diagnosis present

## 2021-11-01 DIAGNOSIS — F32A Depression, unspecified: Secondary | ICD-10-CM | POA: Diagnosis present

## 2021-11-01 DIAGNOSIS — F17210 Nicotine dependence, cigarettes, uncomplicated: Secondary | ICD-10-CM | POA: Diagnosis present

## 2021-11-01 DIAGNOSIS — Z832 Family history of diseases of the blood and blood-forming organs and certain disorders involving the immune mechanism: Secondary | ICD-10-CM | POA: Diagnosis not present

## 2021-11-01 DIAGNOSIS — Z888 Allergy status to other drugs, medicaments and biological substances status: Secondary | ICD-10-CM | POA: Diagnosis not present

## 2021-11-01 DIAGNOSIS — F1120 Opioid dependence, uncomplicated: Secondary | ICD-10-CM | POA: Diagnosis present

## 2021-11-01 DIAGNOSIS — G894 Chronic pain syndrome: Secondary | ICD-10-CM | POA: Diagnosis present

## 2021-11-01 DIAGNOSIS — E559 Vitamin D deficiency, unspecified: Secondary | ICD-10-CM | POA: Diagnosis present

## 2021-11-01 DIAGNOSIS — D638 Anemia in other chronic diseases classified elsewhere: Secondary | ICD-10-CM | POA: Diagnosis present

## 2021-11-01 DIAGNOSIS — Z79899 Other long term (current) drug therapy: Secondary | ICD-10-CM | POA: Diagnosis not present

## 2021-11-01 DIAGNOSIS — R519 Headache, unspecified: Secondary | ICD-10-CM | POA: Diagnosis present

## 2021-11-01 DIAGNOSIS — Z20822 Contact with and (suspected) exposure to covid-19: Secondary | ICD-10-CM | POA: Diagnosis present

## 2021-11-01 DIAGNOSIS — Z8249 Family history of ischemic heart disease and other diseases of the circulatory system: Secondary | ICD-10-CM | POA: Diagnosis not present

## 2021-11-01 DIAGNOSIS — D5700 Hb-SS disease with crisis, unspecified: Secondary | ICD-10-CM | POA: Diagnosis present

## 2021-11-01 DIAGNOSIS — Z8261 Family history of arthritis: Secondary | ICD-10-CM | POA: Diagnosis not present

## 2021-11-01 DIAGNOSIS — Z841 Family history of disorders of kidney and ureter: Secondary | ICD-10-CM | POA: Diagnosis not present

## 2021-11-01 LAB — CBC WITH DIFFERENTIAL/PLATELET
Abs Immature Granulocytes: 0.1 10*3/uL — ABNORMAL HIGH (ref 0.00–0.07)
Basophils Absolute: 0.1 10*3/uL (ref 0.0–0.1)
Basophils Relative: 1 %
Eosinophils Absolute: 0.2 10*3/uL (ref 0.0–0.5)
Eosinophils Relative: 1 %
HCT: 30.4 % — ABNORMAL LOW (ref 36.0–46.0)
Hemoglobin: 10.7 g/dL — ABNORMAL LOW (ref 12.0–15.0)
Immature Granulocytes: 1 %
Lymphocytes Relative: 39 %
Lymphs Abs: 4.7 10*3/uL — ABNORMAL HIGH (ref 0.7–4.0)
MCH: 29.5 pg (ref 26.0–34.0)
MCHC: 35.2 g/dL (ref 30.0–36.0)
MCV: 83.7 fL (ref 80.0–100.0)
Monocytes Absolute: 1.9 10*3/uL — ABNORMAL HIGH (ref 0.1–1.0)
Monocytes Relative: 16 %
Neutro Abs: 5.1 10*3/uL (ref 1.7–7.7)
Neutrophils Relative %: 42 %
Platelets: 528 10*3/uL — ABNORMAL HIGH (ref 150–400)
RBC: 3.63 MIL/uL — ABNORMAL LOW (ref 3.87–5.11)
RDW: 17.2 % — ABNORMAL HIGH (ref 11.5–15.5)
WBC: 12.1 10*3/uL — ABNORMAL HIGH (ref 4.0–10.5)
nRBC: 2.2 % — ABNORMAL HIGH (ref 0.0–0.2)

## 2021-11-01 LAB — COMPREHENSIVE METABOLIC PANEL
ALT: 46 U/L — ABNORMAL HIGH (ref 0–44)
AST: 43 U/L — ABNORMAL HIGH (ref 15–41)
Albumin: 4.5 g/dL (ref 3.5–5.0)
Alkaline Phosphatase: 71 U/L (ref 38–126)
Anion gap: 7 (ref 5–15)
BUN: 19 mg/dL (ref 6–20)
CO2: 24 mmol/L (ref 22–32)
Calcium: 9.7 mg/dL (ref 8.9–10.3)
Chloride: 104 mmol/L (ref 98–111)
Creatinine, Ser: 0.74 mg/dL (ref 0.44–1.00)
GFR, Estimated: >60 mL/min (ref >60)
Glucose, Bld: 108 mg/dL — ABNORMAL HIGH (ref 70–99)
Potassium: 4.2 mmol/L (ref 3.5–5.1)
Sodium: 135 mmol/L (ref 135–145)
Total Bilirubin: 1.4 mg/dL — ABNORMAL HIGH (ref 0.3–1.2)
Total Protein: 9.1 g/dL — ABNORMAL HIGH (ref 6.5–8.1)

## 2021-11-01 LAB — RESP PANEL BY RT-PCR (FLU A&B, COVID) ARPGX2
Influenza A by PCR: NEGATIVE
Influenza B by PCR: NEGATIVE
SARS Coronavirus 2 by RT PCR: NEGATIVE

## 2021-11-01 LAB — RETICULOCYTES
Immature Retic Fract: 43.7 % — ABNORMAL HIGH (ref 2.3–15.9)
RBC.: 3.57 MIL/uL — ABNORMAL LOW (ref 3.87–5.11)
Retic Count, Absolute: 307 10*3/uL — ABNORMAL HIGH (ref 19.0–186.0)
Retic Ct Pct: 8.6 % — ABNORMAL HIGH (ref 0.4–3.1)

## 2021-11-01 LAB — HCG, QUANTITATIVE, PREGNANCY: hCG, Beta Chain, Quant, S: <1 m[IU]/mL (ref <5)

## 2021-11-01 MED ORDER — CYCLOBENZAPRINE HCL 10 MG PO TABS
10.0000 mg | ORAL_TABLET | Freq: Two times a day (BID) | ORAL | Status: DC
  Administered 2021-11-01 – 2021-11-04 (×7): 10 mg via ORAL
  Filled 2021-11-01 (×8): qty 1

## 2021-11-01 MED ORDER — KETOROLAC TROMETHAMINE 30 MG/ML IJ SOLN
15.0000 mg | Freq: Four times a day (QID) | INTRAMUSCULAR | Status: DC
  Administered 2021-11-01 – 2021-11-04 (×13): 15 mg via INTRAVENOUS
  Filled 2021-11-01 (×13): qty 1

## 2021-11-01 MED ORDER — HYDROMORPHONE HCL 1 MG/ML IJ SOLN
1.0000 mg | INTRAMUSCULAR | Status: DC | PRN
  Administered 2021-11-01 (×2): 1 mg via INTRAVENOUS
  Filled 2021-11-01 (×2): qty 1

## 2021-11-01 MED ORDER — POLYETHYLENE GLYCOL 3350 17 G PO PACK: 17.0000 g | PACK | Freq: Every day | ORAL | Status: DC | PRN

## 2021-11-01 MED ORDER — PROMETHAZINE HCL 12.5 MG RE SUPP
12.5000 mg | RECTAL | Status: DC | PRN
  Filled 2021-11-01: qty 2

## 2021-11-01 MED ORDER — SENNOSIDES-DOCUSATE SODIUM 8.6-50 MG PO TABS
1.0000 | ORAL_TABLET | Freq: Two times a day (BID) | ORAL | Status: DC
  Administered 2021-11-01 – 2021-11-04 (×4): 1 via ORAL
  Filled 2021-11-01 (×8): qty 1

## 2021-11-01 MED ORDER — OXYCODONE HCL 5 MG PO TABS
10.0000 mg | ORAL_TABLET | Freq: Four times a day (QID) | ORAL | Status: DC | PRN
  Administered 2021-11-02: 10 mg via ORAL
  Filled 2021-11-01: qty 2

## 2021-11-01 MED ORDER — ENOXAPARIN SODIUM 40 MG/0.4ML IJ SOSY
40.0000 mg | PREFILLED_SYRINGE | INTRAMUSCULAR | Status: DC
  Administered 2021-11-01: 40 mg via SUBCUTANEOUS
  Filled 2021-11-01 (×3): qty 0.4

## 2021-11-01 MED ORDER — FOLIC ACID 1 MG PO TABS
1.0000 mg | ORAL_TABLET | Freq: Every day | ORAL | Status: DC
  Administered 2021-11-01 – 2021-11-04 (×4): 1 mg via ORAL
  Filled 2021-11-01 (×3): qty 1

## 2021-11-01 MED ORDER — HYDROMORPHONE 1 MG/ML IV SOLN
INTRAVENOUS | Status: DC
  Administered 2021-11-01: 4.5 mg via INTRAVENOUS
  Administered 2021-11-02: 2.5 mg via INTRAVENOUS
  Administered 2021-11-02: 6 mg via INTRAVENOUS
  Administered 2021-11-02: 2 mg via INTRAVENOUS
  Filled 2021-11-01: qty 30

## 2021-11-01 MED ORDER — HYDROMORPHONE HCL 2 MG/ML IJ SOLN
2.0000 mg | Freq: Once | INTRAMUSCULAR | Status: AC
  Administered 2021-11-01: 2 mg via INTRAVENOUS
  Filled 2021-11-01: qty 1

## 2021-11-01 MED ORDER — SODIUM CHLORIDE 0.9% FLUSH
9.0000 mL | INTRAVENOUS | Status: DC | PRN
Start: 2021-11-01 — End: 2021-11-04

## 2021-11-01 MED ORDER — SODIUM CHLORIDE 0.45 % IV SOLN: INTRAVENOUS | Status: DC

## 2021-11-01 MED ORDER — NALOXONE HCL 0.4 MG/ML IJ SOLN: 0.4000 mg | INTRAMUSCULAR | Status: DC | PRN

## 2021-11-01 MED ORDER — HYDROMORPHONE 1 MG/ML IV SOLN: INTRAVENOUS | Status: DC

## 2021-11-01 MED ORDER — SODIUM CHLORIDE 0.9 % IV SOLN
25.0000 mg | INTRAVENOUS | Status: DC | PRN
  Filled 2021-11-01: qty 0.5

## 2021-11-01 MED ORDER — PRENATAL MULTIVITAMIN CH
1.0000 | ORAL_TABLET | Freq: Every day | ORAL | Status: DC
  Administered 2021-11-01 – 2021-11-04 (×4): 1 via ORAL
  Filled 2021-11-01 (×5): qty 1

## 2021-11-01 MED ORDER — CITALOPRAM HYDROBROMIDE 20 MG PO TABS
20.0000 mg | ORAL_TABLET | Freq: Every day | ORAL | Status: DC
Start: 2021-11-01 — End: 2021-11-04
  Administered 2021-11-01 – 2021-11-04 (×4): 20 mg via ORAL
  Filled 2021-11-01: qty 2
  Filled 2021-11-01 (×3): qty 1

## 2021-11-01 MED ORDER — HYDROXYUREA 500 MG PO CAPS
500.0000 mg | ORAL_CAPSULE | Freq: Every day | ORAL | Status: DC
  Administered 2021-11-01 – 2021-11-04 (×4): 500 mg via ORAL
  Filled 2021-11-01 (×4): qty 1

## 2021-11-01 MED ORDER — ONDANSETRON HCL 4 MG/2ML IJ SOLN: 4.0000 mg | Freq: Four times a day (QID) | INTRAMUSCULAR | Status: DC | PRN

## 2021-11-01 MED ORDER — HYDROMORPHONE HCL 2 MG/ML IJ SOLN
2.0000 mg | INTRAMUSCULAR | Status: AC
  Administered 2021-11-01: 2 mg via INTRAVENOUS
  Filled 2021-11-01: qty 1

## 2021-11-01 MED ORDER — DIPHENHYDRAMINE HCL 25 MG PO CAPS
25.0000 mg | ORAL_CAPSULE | ORAL | Status: DC | PRN
  Filled 2021-11-01: qty 1

## 2021-11-01 MED ORDER — DULOXETINE HCL 60 MG PO CPEP
60.0000 mg | ORAL_CAPSULE | Freq: Every day | ORAL | Status: DC
  Administered 2021-11-01 – 2021-11-04 (×4): 60 mg via ORAL
  Filled 2021-11-01 (×3): qty 1
  Filled 2021-11-01: qty 2

## 2021-11-01 MED ORDER — SENNOSIDES-DOCUSATE SODIUM 8.6-50 MG PO TABS
1.0000 | ORAL_TABLET | Freq: Every evening | ORAL | Status: DC | PRN
  Administered 2021-11-03: 1 via ORAL

## 2021-11-01 MED ORDER — FOLIC ACID 1 MG PO TABS
1.0000 mg | ORAL_TABLET | Freq: Every day | ORAL | Status: DC
  Filled 2021-11-01: qty 1

## 2021-11-01 MED ORDER — PROMETHAZINE HCL 25 MG PO TABS: 12.5000 mg | ORAL_TABLET | ORAL | Status: DC | PRN

## 2021-11-01 MED ORDER — KETOROLAC TROMETHAMINE 30 MG/ML IJ SOLN
30.0000 mg | Freq: Four times a day (QID) | INTRAMUSCULAR | Status: DC
  Administered 2021-11-01 (×2): 30 mg via INTRAVENOUS
  Filled 2021-11-01 (×2): qty 1

## 2021-11-01 NOTE — H&P (Signed)
History and Physical    KINDLE STROHMEIER JYN:[id number] DOB: Aug 23, 1991 DOA: 10/31/2021  PCP: Vevelyn Francois, NP   Patient coming from: home  I have personally briefly reviewed patient's old medical records in [place]  Chief Complaint: pain all over.  HPI: Carmen [name] is a 31 y.o. female with medical history significant of sickle cell anemia, chronic pain syndrome and  major depressive disorder; who presents to the emergency department secondary to pain all over.  Patient recently discharged from the hospital approximately 48 hours prior to this admission due to another event of sickle cell pain crisis.  Patient's symptoms continue to worsen despite taking medication and also expressed difficulties getting her prescriptions refilled as it is too early for it.  While in the emergency department patient received fluid resuscitation and 3 rounds of IV analgesics without successful improvement in her symptoms.  TRH has been called to place in the hospital for further relation and management.  COVID PCR was negative, chest x-ray without acute cardiopulmonary process; normal respiratory rate and excellent oxygen saturation on room air.  Blood work overall at baseline for her.  Review of Systems: As per HPI otherwise all other systems reviewed and are negative.  Past Medical History:  Diagnosis Date   Anxiety    Headache(784.0)    Heart murmur    Sickle cell crisis ([place])    Syphilis 2015   Was diagnosed and received one injection of antibiotics   Thrombocytosis 11/22/2014     CBC Latest Ref Rng & Units 12/13/2018 12/11/2018 12/10/2018 WBC 4.0 - 10.5 K/uL 14.8(H) 13.2(H) 15.9(H) Hemoglobin 12.0 - 15.0 g/dL 8.2(L) 7.7(L) 8.1(L) Hematocrit 36.0 - 46.0 % 23.7(L) 23.2(L) 24.5(L) Platelets 150 - 400 K/uL 326 399 388      Past Surgical History:  Procedure Laterality Date   CESAREAN SECTION N/A 02/05/2019   Procedure: CESAREAN SECTION;  Surgeon: [name], MD;  Location: [facility] LD ORS;   Service: Obstetrics;  Laterality: N/A;   CHOLECYSTECTOMY N/A 11/30/2014   Procedure: LAPAROSCOPIC CHOLECYSTECTOMY SINGLE SITE WITH INTRAOPERATIVE CHOLANGIOGRAM;  Surgeon: Michael Boston, MD;  Location: [facility] ORS;  Service: General;  Laterality: N/A;   SPLENECTOMY      Social History  reports that she has been smoking cigarettes. She has never used smokeless tobacco. She reports that she does not drink alcohol and does not use drugs.  Allergies  Allergen Reactions   Ketamine Hives and Other (See Comments)    Makes feel funny    Family History  Problem Relation Age of Onset   Hypertension Mother    Sickle cell anemia Sister    Kidney disease Sister        Lupus   Arthritis Sister    Sickle cell anemia Sister    Sickle cell trait Sister    Heart disease Maternal Aunt        CABG   Heart disease Maternal Uncle        CABG   Lupus Sister    Prior to Admission medications   Medication Sig Start Date End Date Taking? Authorizing Provider  betamethasone valerate ointment (VALISONE) 0.1 % Apply 1 application topically in the morning and at bedtime. 10/10/21   [provider]  citalopram (CELEXA) 20 MG tablet TAKE 1 TABLET BY MOUTH EVERY DAY Patient taking differently: Take 20 mg by mouth daily as needed (anxiety). 02/24/21   [name], FNP  cyclobenzaprine (FLEXERIL) 10 MG tablet TAKE 1 TABLET BY MOUTH TWICE  A DAY Patient taking differently: Take 10 mg by mouth 2 (two) times daily as needed for muscle spasms. 10/12/21   Vevelyn Francois, NP  DULoxetine (CYMBALTA) 30 MG capsule Take 2 capsules (60 mg total) by mouth daily. 10/21/21 04/19/22  Vevelyn Francois, NP  folic acid (FOLVITE) 1 MG tablet Take 1 tablet (1 mg total) by mouth daily. 03/15/19   Lanae Boast, FNP  Oxycodone HCl 10 MG TABS Take 10 mg by mouth every 6 (six) hours as needed (pain).    [provider]  Prenatal Vit-Fe Fumarate-FA (PRENATAL VITAMIN PLUS LOW IRON) 27-1 MG TABS TAKE 1 TABLET BY MOUTH EVERY  DAY Patient taking differently: Take 1 tablet by mouth daily. 03/03/20   Shelly Bombard, MD  Vitamin D, Ergocalciferol, (DRISDOL) 1.25 MG (50000 UNIT) CAPS capsule TAKE ONE CAPSULE BY MOUTH ONE TIME PER WEEK Patient taking differently: Take 50,000 Units by mouth every Wednesday. 09/03/20   Vevelyn Francois, NP    Physical Exam: Vitals:   10/31/21 2205 10/31/21 2230 11/01/21 0110 11/01/21 0205  BP: 127/90 120/74 113/85 (!) 117/91  Pulse: (!) 103 90 (!) 106 96  Resp: 17 19 19 17   Temp:      TempSrc:      SpO2: 100% 99% 99% 95%    Constitutional: Afebrile, mild distress secondary to pain all over.  Oriented x3. Vitals:   10/31/21 2205 10/31/21 2230 11/01/21 0110 11/01/21 0205  BP: 127/90 120/74 113/85 (!) 117/91  Pulse: (!) 103 90 (!) 106 96  Resp: 17 19 19 17   Temp:      TempSrc:      SpO2: 100% 99% 99% 95%   Eyes: PERRL, lids and conjunctivae normal, no icterus, no nystagmus. ENMT: Mucous membranes are moist. Posterior pharynx clear of any exudate or lesions. Neck: normal, supple, no masses, no thyromegaly; no JVD. Respiratory: clear to auscultation bilaterally, no wheezing, no crackles.  No using accessory muscles. Cardiovascular: S1 and S2 appreciated; no rubs, no gallops. Abdomen: no tenderness, no masses palpated. No hepatosplenomegaly. Bowel sounds positive.  Musculoskeletal: no clubbing / cyanosis. No joint deformity upper and lower extremities. Good ROM, no contractures. Normal muscle tone.  Skin: no rashes, no petechiae. Neurologic: CN 2-12 grossly intact. Sensation intact, DTR normal. Strength 5/5 in all 4.  Psychiatric: Normal judgment and insight. Alert and oriented x 3. Normal mood.   Labs on Admission: I have personally reviewed following labs and imaging studies  CBC: Recent Labs  Lab 10/26/21 1345 10/27/21 0822 10/31/21 2306  WBC 10.6* 11.9* 12.1*  NEUTROABS 5.9  --  5.1  HGB 10.0* 8.5* 10.7*  HCT 27.7* 23.7* 30.4*  MCV 82.2 83.5 83.7  PLT 486* 408*  528*    Basic Metabolic Panel: Recent Labs  Lab 10/26/21 1345 10/27/21 0822 10/31/21 2306  NA 134* 136 135  K 4.4 4.0 4.2  CL 105 103 104  CO2 24 26 24   GLUCOSE 98 78 108*  BUN 15 18 19   CREATININE 0.63 0.60 0.74  CALCIUM 9.3 9.1 9.7    GFR: Estimated Creatinine Clearance: 108.9 mL/min (by C-G formula based on SCr of 0.74 mg/dL).  Liver Function Tests: Recent Labs  Lab 10/26/21 1345 10/31/21 2306  AST 37 43*  ALT 50* 46*  ALKPHOS 65 71  BILITOT 1.3* 1.4*  PROT 8.6* 9.1*  ALBUMIN 4.4 4.5    Urine analysis:    Component Value Date/Time   COLORURINE STRAW (A) 10/19/2021 [place] 10/19/2021  1045   LABSPEC 1.005 10/19/2021 1045   PHURINE 6.0 10/19/2021 [place] 10/19/2021 [place] 10/19/2021 [place] 10/19/2021 1045   BILIRUBINUR negative 08/26/2021 1527   BILIRUBINUR Negative 08/10/2019 [place] 10/19/2021 1045   PROTEINUR NEGATIVE 10/19/2021 1045   UROBILINOGEN 1.0 08/26/2021 1527   UROBILINOGEN 1.0 01/12/2018 0943   NITRITE NEGATIVE 10/19/2021 [place] 10/19/2021 1045    Radiological Exams on Admission: DG Chest 2 View  Result Date: 10/31/2021 CLINICAL DATA:  Sickle cell crisis.  Shortness of breath. EXAM: CHEST - 2 VIEW COMPARISON:  Chest x-ray 06/19/2021. FINDINGS: The heart size and mediastinal contours are within normal limits. Both lungs are clear. The visualized skeletal structures are unremarkable. IMPRESSION: No active cardiopulmonary disease. Electronically Signed   By: Ronney Asters M.D.   On: 10/31/2021 19:41    EKG: None   Assessment/Plan 1-Sickle cell pain crisis ([place]) -Providing fluid resuscitation using half-normal saline. -PCA narcotic pump and a schedule Toradol to assist with pain management. -Continue folic acid -Patient will be admitted on the sickle cell team for further evaluation and management. -provide oxygen  supplementation. -will start hydroxyurea   2-major depressive disorder -Continue the use of Cymbalta -No suicidal ideation or hallucination.  3-history of vitamin D deficiency -Resume weekly vitamin D supplementation at time of discharge.   DVT prophylaxis: Lovenox Code Status:   Full code Family Communication:  No family at bedside Disposition Plan:   Patient is from:  Home  Anticipated DC to:  Home  Anticipated DC date:  To be determined  Anticipated DC barriers: Pain control  Consults called:  None Admission status:  Inpatient, MedSurg, length of stay more than 2 midnights.  Severity of Illness: The appropriate patient status for this patient is INPATIENT. Inpatient status is judged to be reasonable and necessary in order to provide the required intensity of service to ensure the patient's safety. The patient's presenting symptoms, physical exam findings, and initial radiographic and laboratory data in the context of their chronic comorbidities is felt to place them at high risk for further clinical deterioration. Furthermore, it is not anticipated that the patient will be medically stable for discharge from the hospital within 2 midnights of admission.   * I certify that at the point of admission it is my clinical judgment that the patient will require inpatient hospital care spanning beyond 2 midnights from the point of admission due to high intensity of service, high risk for further deterioration and high frequency of surveillance required.Barton Dubois MD [facility]  How to contact the [facility] Attending or Consulting provider [place] or covering provider during after hours [place], for this patient?   Check the care team in [facility] and look for a) attending/consulting [facility] provider listed and b) the [facility] team listed Log into [email or web address] and use [place]'s universal password to access. If you do not have the password, please contact the hospital operator. Locate the  [facility] provider you are looking for under [facility] and page to a number that you can be directly reached. If you still have difficulty reaching the provider, please page the [facility] (Director on Call) for the Hospitalists listed on amion for assistance.  11/01/2021, 2:25 AM

## 2021-11-01 NOTE — Plan of Care (Signed)
  Problem: Education: Goal: Knowledge of General Education information will improve Description: Including pain rating scale, medication(s)/side effects and non-pharmacologic comfort measures Outcome: Progressing   Problem: Pain Managment: Goal: General experience of comfort will improve Outcome: Progressing   Problem: Coping: Goal: Level of anxiety will decrease Outcome: Progressing   

## 2021-11-02 DIAGNOSIS — D5700 Hb-SS disease with crisis, unspecified: Secondary | ICD-10-CM | POA: Diagnosis not present

## 2021-11-02 LAB — CBC WITH DIFFERENTIAL/PLATELET
Abs Immature Granulocytes: 0.04 10*3/uL (ref 0.00–0.07)
Basophils Absolute: 0.1 10*3/uL (ref 0.0–0.1)
Basophils Relative: 1 %
Eosinophils Absolute: 0.3 10*3/uL (ref 0.0–0.5)
Eosinophils Relative: 4 %
HCT: 24.3 % — ABNORMAL LOW (ref 36.0–46.0)
Hemoglobin: 8.9 g/dL — ABNORMAL LOW (ref 12.0–15.0)
Immature Granulocytes: 1 %
Lymphocytes Relative: 43 %
Lymphs Abs: 3.8 10*3/uL (ref 0.7–4.0)
MCH: 30.6 pg (ref 26.0–34.0)
MCHC: 36.6 g/dL — ABNORMAL HIGH (ref 30.0–36.0)
MCV: 83.5 fL (ref 80.0–100.0)
Monocytes Absolute: 1.4 10*3/uL — ABNORMAL HIGH (ref 0.1–1.0)
Monocytes Relative: 16 %
Neutro Abs: 3.1 10*3/uL (ref 1.7–7.7)
Neutrophils Relative %: 35 %
Platelets: 541 10*3/uL — ABNORMAL HIGH (ref 150–400)
RBC: 2.91 MIL/uL — ABNORMAL LOW (ref 3.87–5.11)
RDW: 16.3 % — ABNORMAL HIGH (ref 11.5–15.5)
WBC: 8.7 10*3/uL (ref 4.0–10.5)
nRBC: 1.6 % — ABNORMAL HIGH (ref 0.0–0.2)

## 2021-11-02 MED ORDER — HYDROMORPHONE 1 MG/ML IV SOLN
INTRAVENOUS | Status: DC
  Administered 2021-11-02: 7 mg via INTRAVENOUS
  Administered 2021-11-02: 8.4 mg via INTRAVENOUS
  Administered 2021-11-03: 30 mg via INTRAVENOUS
  Administered 2021-11-03: 2.4 mg via INTRAVENOUS
  Administered 2021-11-03: 10.4 mg via INTRAVENOUS
  Filled 2021-11-02 (×2): qty 30

## 2021-11-02 MED ORDER — MORPHINE SULFATE ER 30 MG PO TBCR
60.0000 mg | EXTENDED_RELEASE_TABLET | Freq: Two times a day (BID) | ORAL | Status: DC
  Administered 2021-11-02 – 2021-11-04 (×4): 60 mg via ORAL
  Filled 2021-11-02 (×4): qty 2

## 2021-11-02 MED ORDER — MORPHINE SULFATE ER 30 MG PO TBCR
30.0000 mg | EXTENDED_RELEASE_TABLET | Freq: Two times a day (BID) | ORAL | Status: DC
  Administered 2021-11-02: 30 mg via ORAL
  Filled 2021-11-02: qty 1

## 2021-11-02 NOTE — Plan of Care (Signed)
Plan of care reviewed and discussed with the patient. 

## 2021-11-02 NOTE — Progress Notes (Signed)
Subjective: Carmen [name] is a 31 year old female with a medical history significant for sickle cell disease, chronic pain syndrome, opiate dependence and tolerance, frequent hospitalizations, and history of anemia of chronic disease was admitted for sickle cell pain crisis. This morning, patient is very upset that home medications were not sent in by PCP as requested several days ago. Today, patient's pain intensity is 10/10.  She is very agitated this morning.  Patient denies any headache, blurred vision, dizziness, chest pain, urinary symptoms, nausea, vomiting, or diarrhea.  Objective:  Vital signs in last 24 hours:  Vitals:   11/02/21 0039 11/02/21 0343 11/02/21 0408 11/02/21 0732  BP:   116/82   Pulse:   77   Resp: 17  14 14   Temp:   97.9 F (36.6 C)   TempSrc:   Oral   SpO2: 97% 98% 100% 100%  Weight:      Height:        Intake/Output from previous day:   Intake/Output Summary (Last 24 hours) at 11/02/2021 1012 Last data filed at 11/02/2021 0600 Gross per 24 hour  Intake 2041.25 ml  Output --  Net 2041.25 ml    Physical Exam: General: Alert, awake, oriented x3, in no acute distress.  HEENT: [place]/AT PEERL, EOMI Neck: Trachea midline,  no masses, no thyromegal,y no JVD, no carotid bruit OROPHARYNX:  Moist, No exudate/ erythema/lesions.  Heart: Regular rate and rhythm, without murmurs, rubs, gallops, PMI non-displaced, no heaves or thrills on palpation.  Lungs: Clear to auscultation, no wheezing or rhonchi noted. No increased vocal fremitus resonant to percussion  Abdomen: Soft, nontender, nondistended, positive bowel sounds, no masses no hepatosplenomegaly noted..  Neuro: No focal neurological deficits noted cranial nerves II through XII grossly intact. DTRs 2+ bilaterally upper and lower extremities. Strength 5 out of 5 in bilateral upper and lower extremities. Musculoskeletal: No warm swelling or erythema around joints, no spinal tenderness noted. Psychiatric: Patient alert  and oriented x3, good insight and cognition, good recent to remote recall. Lymph node survey: No cervical axillary or inguinal lymphadenopathy noted.  Lab Results:  Basic Metabolic Panel:    Component Value Date/Time   NA 135 10/31/2021 2306   NA 142 07/02/2020 1040   K 4.2 10/31/2021 2306   CL 104 10/31/2021 2306   CO2 24 10/31/2021 2306   BUN 19 10/31/2021 2306   BUN 8 07/02/2020 1040   CREATININE 0.74 10/31/2021 2306   GLUCOSE 108 (H) 10/31/2021 2306   CALCIUM 9.7 10/31/2021 2306   CBC:    Component Value Date/Time   WBC 12.1 (H) 10/31/2021 2306   HGB 10.7 (L) 10/31/2021 2306   HGB 9.8 (L) 07/02/2020 1040   HCT 30.4 (L) 10/31/2021 2306   HCT 28.9 (L) 07/02/2020 1040   PLT 528 (H) 10/31/2021 2306   PLT 628 (H) 07/02/2020 1040   MCV 83.7 10/31/2021 2306   MCV 88 07/02/2020 1040   NEUTROABS 5.1 10/31/2021 2306   NEUTROABS 3.0 07/02/2020 1040   LYMPHSABS 4.7 (H) 10/31/2021 2306   LYMPHSABS 3.5 (H) 07/02/2020 1040   MONOABS 1.9 (H) 10/31/2021 2306   EOSABS 0.2 10/31/2021 2306   EOSABS 0.2 07/02/2020 1040   BASOSABS 0.1 10/31/2021 2306   BASOSABS 0.1 07/02/2020 1040    Recent Results (from the past 240 hour(s))  Resp Panel by RT-PCR (Flu A&B, Covid) Nasopharyngeal Swab     Status: None   Collection Time: 10/26/21  3:12 PM   Specimen: Nasopharyngeal Swab; Nasopharyngeal(NP) swabs in vial  transport medium  Result Value Ref Range Status   SARS Coronavirus 2 by RT PCR NEGATIVE NEGATIVE Final    Comment: (NOTE) SARS-CoV-2 target nucleic acids are NOT DETECTED.  The SARS-CoV-2 RNA is generally detectable in upper respiratory specimens during the acute phase of infection. The lowest concentration of SARS-CoV-2 viral copies this assay can detect is 138 copies/mL. A negative result does not preclude SARS-Cov-2 infection and should not be used as the sole basis for treatment or other patient management decisions. A negative result may occur with  improper specimen  collection/handling, submission of specimen other than nasopharyngeal swab, presence of viral mutation(s) within the areas targeted by this assay, and inadequate number of viral copies(<138 copies/mL). A negative result must be combined with clinical observations, patient history, and epidemiological information. The expected result is Negative.  Fact Sheet for Patients:  EntrepreneurPulse.com.au  Fact Sheet for Healthcare Providers:  IncredibleEmployment.be  This test is no t yet approved or cleared by the [place] FDA and  has been authorized for detection and/or diagnosis of SARS-CoV-2 by FDA under an Emergency Use Authorization (EUA). This EUA will remain  in effect (meaning this test can be used) for the duration of the COVID-19 declaration under Section 564(b)(1) of the Act, 21 U.S.C.section 360bbb-3(b)(1), unless the authorization is terminated  or revoked sooner.       Influenza A by PCR NEGATIVE NEGATIVE Final   Influenza B by PCR NEGATIVE NEGATIVE Final    Comment: (NOTE) The Xpert Xpress SARS-CoV-2/FLU/RSV plus assay is intended as an aid in the diagnosis of influenza from Nasopharyngeal swab specimens and should not be used as a sole basis for treatment. Nasal washings and aspirates are unacceptable for Xpert Xpress SARS-CoV-2/FLU/RSV testing.  Fact Sheet for Patients: EntrepreneurPulse.com.au  Fact Sheet for Healthcare Providers: IncredibleEmployment.be  This test is not yet approved or cleared by the [place] FDA and has been authorized for detection and/or diagnosis of SARS-CoV-2 by FDA under an Emergency Use Authorization (EUA). This EUA will remain in effect (meaning this test can be used) for the duration of the COVID-19 declaration under Section 564(b)(1) of the Act, 21 U.S.C. section 360bbb-3(b)(1), unless the authorization is terminated or revoked.  Performed at [facility], [place] [address], [place], [place] [address]   Resp Panel by RT-PCR (Flu A&B, Covid) Nasopharyngeal Swab     Status: None   Collection Time: 11/01/21  2:01 AM   Specimen: Nasopharyngeal Swab; Nasopharyngeal(NP) swabs in vial transport medium  Result Value Ref Range Status   SARS Coronavirus 2 by RT PCR NEGATIVE NEGATIVE Final    Comment: (NOTE) SARS-CoV-2 target nucleic acids are NOT DETECTED.  The SARS-CoV-2 RNA is generally detectable in upper respiratory specimens during the acute phase of infection. The lowest concentration of SARS-CoV-2 viral copies this assay can detect is 138 copies/mL. A negative result does not preclude SARS-Cov-2 infection and should not be used as the sole basis for treatment or other patient management decisions. A negative result may occur with  improper specimen collection/handling, submission of specimen other than nasopharyngeal swab, presence of viral mutation(s) within the areas targeted by this assay, and inadequate number of viral copies(<138 copies/mL). A negative result must be combined with clinical observations, patient history, and epidemiological information. The expected result is Negative.  Fact Sheet for Patients:  EntrepreneurPulse.com.au  Fact Sheet for Healthcare Providers:  IncredibleEmployment.be  This test is no t yet approved or cleared by the [place] FDA and  has  been authorized for detection and/or diagnosis of SARS-CoV-2 by FDA under an Emergency Use Authorization (EUA). This EUA will remain  in effect (meaning this test can be used) for the duration of the COVID-19 declaration under Section 564(b)(1) of the Act, 21 U.S.C.section 360bbb-3(b)(1), unless the authorization is terminated  or revoked sooner.       Influenza A by PCR NEGATIVE NEGATIVE Final   Influenza B by PCR NEGATIVE NEGATIVE Final    Comment: (NOTE) The Xpert Xpress SARS-CoV-2/FLU/RSV plus  assay is intended as an aid in the diagnosis of influenza from Nasopharyngeal swab specimens and should not be used as a sole basis for treatment. Nasal washings and aspirates are unacceptable for Xpert Xpress SARS-CoV-2/FLU/RSV testing.  Fact Sheet for Patients: EntrepreneurPulse.com.au  Fact Sheet for Healthcare Providers: IncredibleEmployment.be  This test is not yet approved or cleared by the [place] FDA and has been authorized for detection and/or diagnosis of SARS-CoV-2 by FDA under an Emergency Use Authorization (EUA). This EUA will remain in effect (meaning this test can be used) for the duration of the COVID-19 declaration under Section 564(b)(1) of the Act, 21 U.S.C. section 360bbb-3(b)(1), unless the authorization is terminated or revoked.  Performed at [facility], [place] [address], [place], [place] [address]     Studies/Results: DG Chest 2 View  Result Date: 10/31/2021 CLINICAL DATA:  Sickle cell crisis.  Shortness of breath. EXAM: CHEST - 2 VIEW COMPARISON:  Chest x-ray 06/19/2021. FINDINGS: The heart size and mediastinal contours are within normal limits. Both lungs are clear. The visualized skeletal structures are unremarkable. IMPRESSION: No active cardiopulmonary disease. Electronically Signed   By: Ronney Asters M.D.   On: 10/31/2021 19:41    Medications: Scheduled Meds:  citalopram  20 mg Oral Daily   cyclobenzaprine  10 mg Oral BID   DULoxetine  60 mg Oral Daily   enoxaparin (LOVENOX) injection  40 mg Subcutaneous G40N   folic acid  1 mg Oral Daily   HYDROmorphone   Intravenous Q4H   hydroxyurea  500 mg Oral Daily   ketorolac  15 mg Intravenous Q6H   prenatal multivitamin  1 tablet Oral Daily   senna-docusate  1 tablet Oral BID   Continuous Infusions:  sodium chloride 75 mL/hr at 11/02/21 0731   diphenhydrAMINE     PRN Meds:.diphenhydrAMINE **OR** diphenhydrAMINE, naloxone **AND** sodium  chloride flush, ondansetron (ZOFRAN) IV, oxyCODONE, polyethylene glycol, promethazine **OR** promethazine, senna-docusate  Consultants: None  Procedures: None  Antibiotics None  Assessment/Plan: Active Problems:   Sickle cell pain crisis (HCC)   Sickle cell anemia with crisis (HCC)  Sickle cell disease with pain crisis: Decrease IV fluids to KVO Increase IV Dilaudid PCA settings 0 point 6/10/4 MS Contin 60 mg every 12 hours Hold oxycodone, use PCA Dilaudid Toradol 15 mg every 6 hours Monitor vital signs very closely, reevaluate pain scale regularly, and supplemental oxygen as needed  Chronic pain syndrome: Continue home medications  History of depression: Continue home medications  History of anemia of chronic disease: Patient's hemoglobin is stable and consistent with her baseline.  There is no clinical indication for blood transfusion at this time.   Code Status: Full Code Family Communication: N/A Disposition Plan: Not yet ready for discharge  [place], MSN, FNP-C Patient [place] [address] [place],  [address] [phone]  If 8PM-5AM, please contact night-coverage.  11/02/2021, 10:12 AM  LOS: 1 day

## 2021-11-03 ENCOUNTER — Telehealth: Payer: Self-pay

## 2021-11-03 DIAGNOSIS — D5700 Hb-SS disease with crisis, unspecified: Secondary | ICD-10-CM | POA: Diagnosis not present

## 2021-11-03 MED ORDER — OXYCODONE HCL 5 MG PO TABS
10.0000 mg | ORAL_TABLET | ORAL | Status: DC | PRN
  Administered 2021-11-03 – 2021-11-04 (×4): 10 mg via ORAL
  Filled 2021-11-03 (×4): qty 2

## 2021-11-03 MED ORDER — HYDROMORPHONE 1 MG/ML IV SOLN
INTRAVENOUS | Status: DC
  Administered 2021-11-03: 11 mg via INTRAVENOUS
  Administered 2021-11-04: 30 mg via INTRAVENOUS
  Administered 2021-11-04: 6.5 mg via INTRAVENOUS
  Administered 2021-11-04: 5 mg via INTRAVENOUS
  Administered 2021-11-04: 7 mg via INTRAVENOUS
  Filled 2021-11-03: qty 30

## 2021-11-03 MED ORDER — ENSURE ENLIVE PO LIQD
237.0000 mL | Freq: Two times a day (BID) | ORAL | Status: DC
  Administered 2021-11-04 (×2): 237 mL via ORAL

## 2021-11-03 NOTE — Progress Notes (Signed)
Subjective: Patient continues to complain of pain primarily to low back and lower extremities.  She rates pain as 8/10.  Patient states that she has been ambulating in room without assistance. She denies any headache, chest pain, urinary symptoms, nausea, vomiting, or diarrhea.  Objective:  Vital signs in last 24 hours:  Vitals:   11/03/21 0405 11/03/21 0748 11/03/21 1003 11/03/21 1204  BP:   110/67   Pulse:   85   Resp: 15 18 18 18   Temp:   99 F (37.2 C)   TempSrc:   Oral   SpO2: 99% 100% 100% 99%  Weight:      Height:        Intake/Output from previous day:   Intake/Output Summary (Last 24 hours) at 11/03/2021 1247 Last data filed at 11/03/2021 0600 Gross per 24 hour  Intake 861.44 ml  Output --  Net 861.44 ml    Physical Exam: General: Alert, awake, oriented x3, in no acute distress.  HEENT: [place]/AT PEERL, EOMI Neck: Trachea midline,  no masses, no thyromegal,y no JVD, no carotid bruit OROPHARYNX:  Moist, No exudate/ erythema/lesions.  Heart: Regular rate and rhythm, without murmurs, rubs, gallops, PMI non-displaced, no heaves or thrills on palpation.  Lungs: Clear to auscultation, no wheezing or rhonchi noted. No increased vocal fremitus resonant to percussion  Abdomen: Soft, nontender, nondistended, positive bowel sounds, no masses no hepatosplenomegaly noted..  Neuro: No focal neurological deficits noted cranial nerves II through XII grossly intact. DTRs 2+ bilaterally upper and lower extremities. Strength 5 out of 5 in bilateral upper and lower extremities. Musculoskeletal: No warm swelling or erythema around joints, no spinal tenderness noted. Psychiatric: Patient alert and oriented x3, good insight and cognition, good recent to remote recall. Lymph node survey: No cervical axillary or inguinal lymphadenopathy noted.  Lab Results:  Basic Metabolic Panel:    Component Value Date/Time   NA 135 10/31/2021 2306   NA 142 07/02/2020 1040   K 4.2 10/31/2021 2306   CL  104 10/31/2021 2306   CO2 24 10/31/2021 2306   BUN 19 10/31/2021 2306   BUN 8 07/02/2020 1040   CREATININE 0.74 10/31/2021 2306   GLUCOSE 108 (H) 10/31/2021 2306   CALCIUM 9.7 10/31/2021 2306   CBC:    Component Value Date/Time   WBC 8.7 11/02/2021 1157   HGB 8.9 (L) 11/02/2021 1157   HGB 9.8 (L) 07/02/2020 1040   HCT 24.3 (L) 11/02/2021 1157   HCT 28.9 (L) 07/02/2020 1040   PLT 541 (H) 11/02/2021 1157   PLT 628 (H) 07/02/2020 1040   MCV 83.5 11/02/2021 1157   MCV 88 07/02/2020 1040   NEUTROABS 3.1 11/02/2021 1157   NEUTROABS 3.0 07/02/2020 1040   LYMPHSABS 3.8 11/02/2021 1157   LYMPHSABS 3.5 (H) 07/02/2020 1040   MONOABS 1.4 (H) 11/02/2021 1157   EOSABS 0.3 11/02/2021 1157   EOSABS 0.2 07/02/2020 1040   BASOSABS 0.1 11/02/2021 1157   BASOSABS 0.1 07/02/2020 1040    Recent Results (from the past 240 hour(s))  Resp Panel by RT-PCR (Flu A&B, Covid) Nasopharyngeal Swab     Status: None   Collection Time: 10/26/21  3:12 PM   Specimen: Nasopharyngeal Swab; Nasopharyngeal(NP) swabs in vial transport medium  Result Value Ref Range Status   SARS Coronavirus 2 by RT PCR NEGATIVE NEGATIVE Final    Comment: (NOTE) SARS-CoV-2 target nucleic acids are NOT DETECTED.  The SARS-CoV-2 RNA is generally detectable in upper respiratory specimens during the acute phase of infection.  The lowest concentration of SARS-CoV-2 viral copies this assay can detect is 138 copies/mL. A negative result does not preclude SARS-Cov-2 infection and should not be used as the sole basis for treatment or other patient management decisions. A negative result may occur with  improper specimen collection/handling, submission of specimen other than nasopharyngeal swab, presence of viral mutation(s) within the areas targeted by this assay, and inadequate number of viral copies(<138 copies/mL). A negative result must be combined with clinical observations, patient history, and epidemiological information. The  expected result is Negative.  Fact Sheet for Patients:  EntrepreneurPulse.com.au  Fact Sheet for Healthcare Providers:  IncredibleEmployment.be  This test is no t yet approved or cleared by the [place] FDA and  has been authorized for detection and/or diagnosis of SARS-CoV-2 by FDA under an Emergency Use Authorization (EUA). This EUA will remain  in effect (meaning this test can be used) for the duration of the COVID-19 declaration under Section 564(b)(1) of the Act, 21 U.S.C.section 360bbb-3(b)(1), unless the authorization is terminated  or revoked sooner.       Influenza A by PCR NEGATIVE NEGATIVE Final   Influenza B by PCR NEGATIVE NEGATIVE Final    Comment: (NOTE) The Xpert Xpress SARS-CoV-2/FLU/RSV plus assay is intended as an aid in the diagnosis of influenza from Nasopharyngeal swab specimens and should not be used as a sole basis for treatment. Nasal washings and aspirates are unacceptable for Xpert Xpress SARS-CoV-2/FLU/RSV testing.  Fact Sheet for Patients: EntrepreneurPulse.com.au  Fact Sheet for Healthcare Providers: IncredibleEmployment.be  This test is not yet approved or cleared by the [place] FDA and has been authorized for detection and/or diagnosis of SARS-CoV-2 by FDA under an Emergency Use Authorization (EUA). This EUA will remain in effect (meaning this test can be used) for the duration of the COVID-19 declaration under Section 564(b)(1) of the Act, 21 U.S.C. section 360bbb-3(b)(1), unless the authorization is terminated or revoked.  Performed at [facility], [place] [address], [place], [place] [address]   Resp Panel by RT-PCR (Flu A&B, Covid) Nasopharyngeal Swab     Status: None   Collection Time: 11/01/21  2:01 AM   Specimen: Nasopharyngeal Swab; Nasopharyngeal(NP) swabs in vial transport medium  Result Value Ref Range Status   SARS Coronavirus 2  by RT PCR NEGATIVE NEGATIVE Final    Comment: (NOTE) SARS-CoV-2 target nucleic acids are NOT DETECTED.  The SARS-CoV-2 RNA is generally detectable in upper respiratory specimens during the acute phase of infection. The lowest concentration of SARS-CoV-2 viral copies this assay can detect is 138 copies/mL. A negative result does not preclude SARS-Cov-2 infection and should not be used as the sole basis for treatment or other patient management decisions. A negative result may occur with  improper specimen collection/handling, submission of specimen other than nasopharyngeal swab, presence of viral mutation(s) within the areas targeted by this assay, and inadequate number of viral copies(<138 copies/mL). A negative result must be combined with clinical observations, patient history, and epidemiological information. The expected result is Negative.  Fact Sheet for Patients:  EntrepreneurPulse.com.au  Fact Sheet for Healthcare Providers:  IncredibleEmployment.be  This test is no t yet approved or cleared by the [place] FDA and  has been authorized for detection and/or diagnosis of SARS-CoV-2 by FDA under an Emergency Use Authorization (EUA). This EUA will remain  in effect (meaning this test can be used) for the duration of the COVID-19 declaration under Section 564(b)(1) of the Act, 21 U.S.C.section 360bbb-3(b)(1), unless the authorization  is terminated  or revoked sooner.       Influenza A by PCR NEGATIVE NEGATIVE Final   Influenza B by PCR NEGATIVE NEGATIVE Final    Comment: (NOTE) The Xpert Xpress SARS-CoV-2/FLU/RSV plus assay is intended as an aid in the diagnosis of influenza from Nasopharyngeal swab specimens and should not be used as a sole basis for treatment. Nasal washings and aspirates are unacceptable for Xpert Xpress SARS-CoV-2/FLU/RSV testing.  Fact Sheet for Patients: EntrepreneurPulse.com.au  Fact  Sheet for Healthcare Providers: IncredibleEmployment.be  This test is not yet approved or cleared by the [place] FDA and has been authorized for detection and/or diagnosis of SARS-CoV-2 by FDA under an Emergency Use Authorization (EUA). This EUA will remain in effect (meaning this test can be used) for the duration of the COVID-19 declaration under Section 564(b)(1) of the Act, 21 U.S.C. section 360bbb-3(b)(1), unless the authorization is terminated or revoked.  Performed at [facility], [name] [address], [place], [place] [address]     Studies/Results: No results found.  Medications: Scheduled Meds:  citalopram  20 mg Oral Daily   cyclobenzaprine  10 mg Oral BID   DULoxetine  60 mg Oral Daily   enoxaparin (LOVENOX) injection  40 mg Subcutaneous K99I   folic acid  1 mg Oral Daily   HYDROmorphone   Intravenous Q4H   hydroxyurea  500 mg Oral Daily   ketorolac  15 mg Intravenous Q6H   morphine  60 mg Oral Q12H   prenatal multivitamin  1 tablet Oral Daily   senna-docusate  1 tablet Oral BID   Continuous Infusions:  sodium chloride 10 mL/hr at 11/02/21 1538   diphenhydrAMINE     PRN Meds:.diphenhydrAMINE **OR** diphenhydrAMINE, naloxone **AND** sodium chloride flush, ondansetron (ZOFRAN) IV, oxyCODONE, polyethylene glycol, promethazine **OR** promethazine, senna-docusate  Consultants: None  Procedures: none  Antibiotics: none  Assessment/Plan: Principal Problem:   Sickle cell pain crisis ([place]) Active Problems:   Anemia of chronic disease   Sickle cell anemia with crisis (HCC)   Chronic pain syndrome  Sickle cell disease with pain crisis: Continue IV fluids to KVO Weaning IV Dilaudid PCA MS Contin 60 mg every 12 hours Oxycodone 10 mg every 4 hours as needed for severe breakthrough pain Toradol 15 mg every 6 hours Monitor vital signs very closely, reevaluate pain scale regularly, and supplemental oxygen as  needed  Chronic pain syndrome: Continue home medications  History of depression: Clinically stable.  No suicidal homicidal intentions.  Monitor closely.  Anemia of chronic disease: Hemoglobin remained stable and consistent with patient's baseline..  No clinical indication for blood transfusions at this time.  Code Status: Full Code Family Communication: N/A Disposition Plan: Not yet ready for discharge  [place], MSN, FNP-C Patient [place] [address] [place], [place] [address] [phone]  If 5PM-8AM, please contact night-coverage.  11/03/2021, 12:47 PM  LOS: 2 days

## 2021-11-03 NOTE — Plan of Care (Signed)
Plan of care reviewed and discussed with the patient. 

## 2021-11-03 NOTE — TOC Initial Note (Signed)
Transition of Care [facility]) - Initial/Assessment Note   Patient Details  Name: Carmen [name] MRN: [id number] Date of Birth: 1991/10/31  Transition of Care [facility]) CM/SW Contact:    Sherie Don, LCSW Phone Number: 11/03/2021, 12:05 PM  Clinical Narrative: Readmission checklist completed due to high readmission score. CSW met with patient to complete assessment. Per patient, she resides at home with her mother and baby. Patient is independent with ADLs and baseline and reported no issues with getting monthly medications. Patient does not have DME at home, but reports that she frequently holds onto walls or furniture to ambulate around her apartment. Patient has no history of [place] services and after screening does not seem to need it at this time. Patient and family can transport her to medical appointments and she also uses Medicaid and sickle cell transportation. NO TOC needs identified at this time, but TOC will continue to monitor for possible needs.  Expected Discharge Plan: Home/Self Care Barriers to Discharge: Continued Medical Work up  Patient Goals and CMS Choice Choice offered to / list presented to : NA  Expected Discharge Plan and Services Expected Discharge Plan: Home/Self Care In-house Referral: Clinical Social Work Post Acute Care Choice: NA Living arrangements for the past 2 months: Apartment           DME Arranged: N/A DME Agency: NA  Prior Living Arrangements/Services Living arrangements for the past 2 months: Apartment Lives with:: Minor Children, Parents Patient language and need for interpreter reviewed:: Yes Do you feel safe going back to the place where you live?: Yes      Need for Family Participation in Patient Care: Yes (Comment) Care giver support system in place?: Yes (comment) Criminal Activity/Legal Involvement Pertinent to Current Situation/Hospitalization: No - Comment as needed  Activities of Daily Living Home Assistive Devices/Equipment: None ADL  Screening (condition at time of admission) Patient's cognitive ability adequate to safely complete daily activities?: Yes Is the patient deaf or have difficulty hearing?: No Does the patient have difficulty seeing, even when wearing glasses/contacts?: No Does the patient have difficulty concentrating, remembering, or making decisions?: No Patient able to express need for assistance with ADLs?: Yes Does the patient have difficulty dressing or bathing?: No Independently performs ADLs?: Yes (appropriate for developmental age) Does the patient have difficulty walking or climbing stairs?: No Weakness of Legs: None Weakness of Arms/Hands: None   Emotional Assessment Appearance:: Appears stated age Attitude/Demeanor/Rapport: Engaged Affect (typically observed): Accepting Orientation: : Oriented to Self, Oriented to Place, Oriented to  Time, Oriented to Situation Alcohol / Substance Use: Tobacco Use Psych Involvement: No (comment)  Admission diagnosis:  Sickle cell anemia with crisis ([place]) [D57.00] Sickle cell pain crisis ([place]) [D57.00] Patient Active Problem List   Diagnosis Date Noted   Acute chest syndrome due to sickle cell crisis ([place]) 09/05/2021   Chronic pain syndrome 08/21/2021   Moderate episode of recurrent major depressive disorder ([place]) 05/28/2021   Major depressive disorder 05/22/2021   Tobacco abuse 05/21/2021   Sickle cell disease with crisis ([place]) 03/23/2021   Acute pain of right shoulder    Sickle cell anemia with crisis ([place]) 02/16/2021   Community acquired pneumonia of right lower lobe of lung    Fever of unknown origin 10/14/2020   Sickle cell crisis ([place]) 03/31/2020   Bacterial vaginitis 01/04/2020   Acute cystitis without hematuria    Dysuria 12/31/2019   Urine leukocytes    Sickle cell pain crisis ([place]) 09/10/2019   Sickle cell disease ([place])  12/04/2018   Alpha thalassemia silent carrier 12/04/2018   Chronic prescription opiate use 03/13/2018   Chronic  musculoskeletal pain 03/13/2018   Vitamin D deficiency 01/13/2018   Leukocytosis 08/21/2017   Hypokalemia 06/27/2017   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[facility]) 04/05/2017   Hb-SS disease without crisis ([place]) 08/15/2016   Anemia of chronic disease    Chest pain 81/82/9937   Systolic ejection murmur 16/96/7893   Sickle cell anemia of mother during pregnancy ([place]) 05/16/2014   PCP:  Vevelyn Francois, NP Pharmacy:   [id number] [place] [id number]: [phone]: [phone] Readmission Risk Interventions Readmission Risk Prevention Plan 11/03/2021 10/28/2021 10/21/2021  Transportation Screening Complete - Complete  PCP or Specialist Appt within 3-5 Days - - -  Not Complete comments - - -  [place] or [place] Consult comments - - -  Social Work Consult for [place]/Counseling - - -  Palliative Care Screening - - -  Medication Review (RN Care Manager) Complete Complete Complete  PCP or Specialist appointment within 3-5 days of discharge - Complete Complete  HRI or Home Care Consult Complete - Complete  SW Recovery Care/Counseling Consult Complete Complete Complete  Palliative Care Screening Not Applicable Not Applicable Not AClarence CenterNot Applicable Not Applicable Not Applicable  Some recent data might be hidden

## 2021-11-03 NOTE — Plan of Care (Signed)
  Problem: Pain Managment: Goal: General experience of comfort will improve Outcome: Progressing   Problem: Safety: Goal: Ability to remain free from injury will improve Outcome: Progressing   Problem: Elimination: Goal: Will not experience complications related to bowel motility Outcome: Progressing   

## 2021-11-03 NOTE — Telephone Encounter (Signed)
Oxycodone 10mg

## 2021-11-04 ENCOUNTER — Other Ambulatory Visit: Payer: Self-pay | Admitting: Nurse Practitioner

## 2021-11-04 ENCOUNTER — Other Ambulatory Visit: Payer: Self-pay | Admitting: Family Medicine

## 2021-11-04 DIAGNOSIS — Z79891 Long term (current) use of opiate analgesic: Secondary | ICD-10-CM

## 2021-11-04 DIAGNOSIS — D571 Sickle-cell disease without crisis: Secondary | ICD-10-CM

## 2021-11-04 DIAGNOSIS — D5700 Hb-SS disease with crisis, unspecified: Secondary | ICD-10-CM | POA: Diagnosis not present

## 2021-11-04 MED ORDER — OXYCODONE HCL 10 MG PO TABS: 10.0000 mg | ORAL_TABLET | Freq: Four times a day (QID) | ORAL | 0 refills | Status: DC | PRN

## 2021-11-04 MED ORDER — MORPHINE SULFATE ER 60 MG PO TBCR: 60.0000 mg | EXTENDED_RELEASE_TABLET | Freq: Two times a day (BID) | ORAL | 0 refills | Status: DC

## 2021-11-04 NOTE — Progress Notes (Signed)
56mL of hydromorphone PCA wasted in stericycle with Clerance Lav, RN.

## 2021-11-04 NOTE — Discharge Summary (Incomplete)
Physician Discharge Summary  Carmen [name] VFI:[id number] DOB: [birth year] DOA: 10/31/2021  PCP: Vevelyn Francois, NP  Admit date: 10/31/2021  Discharge date: 11/04/2021  Discharge Diagnoses:  Principal Problem:   Sickle cell pain crisis ([place]) Active Problems:   Anemia of chronic disease   Sickle cell anemia with crisis ([place])   Chronic pain syndrome   Discharge Condition: Stable  Disposition:   Follow-up Information     Vevelyn Francois, NP Follow up.   Specialty: Adult Health Nurse Practitioner Contact information: [address] [name] [address] [phone]         [name], FNP Follow up.   Specialty: Family Medicine Contact information: [place] [address] [phone]                Pt is discharged home in good condition and is to follow up with Vevelyn Francois, NP this week to have labs evaluated. Carmen [name] is instructed to increase activity slowly and balance with rest for the next few days, and use prescribed medication to complete treatment of pain  Diet: Regular Wt Readings from Last 3 Encounters:  11/01/21 86.2 kg  10/26/21 86.2 kg  10/20/21 86.2 kg    History of present illness:  Carmen [name] is a 31 year old female   Hospital Course:  Patient was admitted for sickle cell pain crisis and managed appropriately with IVF, IV Dilaudid via PCA and IV Toradol, as well as other adjunct therapies per sickle cell pain management protocols.  Patient was therefore discharged home today in a hemodynamically stable condition.   Carmen [name] will follow-up with PCP within 1 week of this discharge. Carmen [name] was counseled extensively about nonpharmacologic means of pain management, patient verbalized understanding and was appreciative of  the care received during this admission.   We discussed the need for good hydration, monitoring of hydration status, avoidance of heat, cold, stress, and infection triggers. We  discussed the need to be adherent with taking Hydrea and other home medications. Patient was reminded of the need to seek medical attention immediately if any symptom of bleeding, anemia, or infection occurs.  Discharge Exam: Vitals:   11/04/21 0837 11/04/21 1017  BP:  111/83  Pulse:  94  Resp: 12 18  Temp:  98 F (36.7 C)  SpO2: 100% 95%   Vitals:   11/04/21 0615 11/04/21 0616 11/04/21 0837 11/04/21 1017  BP:  109/65  111/83  Pulse:  88  94  Resp: 13 17 12 18   Temp:  98.2 F (36.8 C)  98 F (36.7 C)  TempSrc:  Oral  Oral  SpO2: 93% 98% 100% 95%  Weight:      Height:        General appearance : Awake, alert, not in any distress. Speech Clear. Not toxic looking HEENT: Atraumatic and Normocephalic, pupils equally reactive to light and accomodation Neck: Supple, no JVD. No cervical lymphadenopathy.  Chest: Good air entry bilaterally, no added sounds  CVS: S1 S2 regular, no murmurs.  Abdomen: Bowel sounds present, Non tender and not distended with no gaurding, rigidity or rebound. Extremities: B/L Lower Ext shows no edema, both legs are warm to touch Neurology: Awake alert, and oriented X 3, CN II-XII intact, Non focal Skin: No Rash  Discharge Instructions  Discharge Instructions     Discharge patient   Complete by: As directed    Discharge disposition: 01-Home or Self Care   Discharge patient  date: 11/04/2021      Allergies as of 11/04/2021       Reactions   Ketamine Hives, Other (See Comments)   Makes feel funny        Medication List     TAKE these medications    betamethasone valerate ointment 0.1 % Commonly known as: VALISONE Apply 1 application topically in the morning and at bedtime.   citalopram 20 MG tablet Commonly known as: CELEXA TAKE 1 TABLET BY MOUTH EVERY DAY What changed:  when to take this reasons to take this   cyclobenzaprine 10 MG tablet Commonly known as: FLEXERIL TAKE 1 TABLET BY MOUTH TWICE A DAY What changed:  when to take  this reasons to take this   DULoxetine 30 MG capsule Commonly known as: CYMBALTA Take 2 capsules (60 mg total) by mouth daily.   folic acid 1 MG tablet Commonly known as: FOLVITE Take 1 tablet (1 mg total) by mouth daily.   Oxycodone HCl 10 MG Tabs Take 10 mg by mouth every 6 (six) hours as needed (pain).   Prenatal Vitamin Plus Low Iron 27-1 MG Tabs TAKE 1 TABLET BY MOUTH EVERY DAY   Vitamin D (Ergocalciferol) 1.25 MG (50000 UNIT) Caps capsule Commonly known as: DRISDOL TAKE ONE CAPSULE BY MOUTH ONE TIME PER WEEK What changed:  how much to take how to take this when to take this additional instructions        The results of significant diagnostics from this hospitalization (including imaging, microbiology, ancillary and laboratory) are listed below for reference.    Significant Diagnostic Studies: DG Chest 2 View  Result Date: 10/31/2021 CLINICAL DATA:  Sickle cell crisis.  Shortness of breath. EXAM: CHEST - 2 VIEW COMPARISON:  Chest x-ray 06/19/2021. FINDINGS: The heart size and mediastinal contours are within normal limits. Both lungs are clear. The visualized skeletal structures are unremarkable. IMPRESSION: No active cardiopulmonary disease. Electronically Signed   By: Ronney Asters M.D.   On: 10/31/2021 19:41    Microbiology: Recent Results (from the past 240 hour(s))  Resp Panel by RT-PCR (Flu A&B, Covid) Nasopharyngeal Swab     Status: None   Collection Time: 10/26/21  3:12 PM   Specimen: Nasopharyngeal Swab; Nasopharyngeal(NP) swabs in vial transport medium  Result Value Ref Range Status   SARS Coronavirus 2 by RT PCR NEGATIVE NEGATIVE Final    Comment: (NOTE) SARS-CoV-2 target nucleic acids are NOT DETECTED.  The SARS-CoV-2 RNA is generally detectable in upper respiratory specimens during the acute phase of infection. The lowest concentration of SARS-CoV-2 viral copies this assay can detect is 138 copies/mL. A negative result does not preclude  SARS-Cov-2 infection and should not be used as the sole basis for treatment or other patient management decisions. A negative result may occur with  improper specimen collection/handling, submission of specimen other than nasopharyngeal swab, presence of viral mutation(s) within the areas targeted by this assay, and inadequate number of viral copies(<138 copies/mL). A negative result must be combined with clinical observations, patient history, and epidemiological information. The expected result is Negative.  Fact Sheet for Patients:  EntrepreneurPulse.com.au  Fact Sheet for Healthcare Providers:  IncredibleEmployment.be  This test is no t yet approved or cleared by the [place] FDA and  has been authorized for detection and/or diagnosis of SARS-CoV-2 by FDA under an Emergency Use Authorization (EUA). This EUA will remain  in effect (meaning this test can be used) for the duration of the COVID-19 declaration under Section 564(b)(1) of  the Act, 21 U.S.C.section 360bbb-3(b)(1), unless the authorization is terminated  or revoked sooner.       Influenza A by PCR NEGATIVE NEGATIVE Final   Influenza B by PCR NEGATIVE NEGATIVE Final    Comment: (NOTE) The Xpert Xpress SARS-CoV-2/FLU/RSV plus assay is intended as an aid in the diagnosis of influenza from Nasopharyngeal swab specimens and should not be used as a sole basis for treatment. Nasal washings and aspirates are unacceptable for Xpert Xpress SARS-CoV-2/FLU/RSV testing.  Fact Sheet for Patients: EntrepreneurPulse.com.au  Fact Sheet for Healthcare Providers: IncredibleEmployment.be  This test is not yet approved or cleared by the [place] FDA and has been authorized for detection and/or diagnosis of SARS-CoV-2 by FDA under an Emergency Use Authorization (EUA). This EUA will remain in effect (meaning this test can be used) for the duration of  the COVID-19 declaration under Section 564(b)(1) of the Act, 21 U.S.C. section 360bbb-3(b)(1), unless the authorization is terminated or revoked.  Performed at [facility], [place] [address], [place], [place] [address]   Resp Panel by RT-PCR (Flu A&B, Covid) Nasopharyngeal Swab     Status: None   Collection Time: 11/01/21  2:01 AM   Specimen: Nasopharyngeal Swab; Nasopharyngeal(NP) swabs in vial transport medium  Result Value Ref Range Status   SARS Coronavirus 2 by RT PCR NEGATIVE NEGATIVE Final    Comment: (NOTE) SARS-CoV-2 target nucleic acids are NOT DETECTED.  The SARS-CoV-2 RNA is generally detectable in upper respiratory specimens during the acute phase of infection. The lowest concentration of SARS-CoV-2 viral copies this assay can detect is 138 copies/mL. A negative result does not preclude SARS-Cov-2 infection and should not be used as the sole basis for treatment or other patient management decisions. A negative result may occur with  improper specimen collection/handling, submission of specimen other than nasopharyngeal swab, presence of viral mutation(s) within the areas targeted by this assay, and inadequate number of viral copies(<138 copies/mL). A negative result must be combined with clinical observations, patient history, and epidemiological information. The expected result is Negative.  Fact Sheet for Patients:  EntrepreneurPulse.com.au  Fact Sheet for Healthcare Providers:  IncredibleEmployment.be  This test is no t yet approved or cleared by the [place] FDA and  has been authorized for detection and/or diagnosis of SARS-CoV-2 by FDA under an Emergency Use Authorization (EUA). This EUA will remain  in effect (meaning this test can be used) for the duration of the COVID-19 declaration under Section 564(b)(1) of the Act, 21 U.S.C.section 360bbb-3(b)(1), unless the authorization is terminated  or  revoked sooner.       Influenza A by PCR NEGATIVE NEGATIVE Final   Influenza B by PCR NEGATIVE NEGATIVE Final    Comment: (NOTE) The Xpert Xpress SARS-CoV-2/FLU/RSV plus assay is intended as an aid in the diagnosis of influenza from Nasopharyngeal swab specimens and should not be used as a sole basis for treatment. Nasal washings and aspirates are unacceptable for Xpert Xpress SARS-CoV-2/FLU/RSV testing.  Fact Sheet for Patients: EntrepreneurPulse.com.au  Fact Sheet for Healthcare Providers: IncredibleEmployment.be  This test is not yet approved or cleared by the [place] FDA and has been authorized for detection and/or diagnosis of SARS-CoV-2 by FDA under an Emergency Use Authorization (EUA). This EUA will remain in effect (meaning this test can be used) for the duration of the COVID-19 declaration under Section 564(b)(1) of the Act, 21 U.S.C. section 360bbb-3(b)(1), unless the authorization is terminated or revoked.  Performed at [facility], [place]  [address], [place], [place] [address]      Labs: Basic Metabolic Panel: Recent Labs  Lab 10/31/21 2306  NA 135  K 4.2  CL 104  CO2 24  GLUCOSE 108*  BUN 19  CREATININE 0.74  CALCIUM 9.7   Liver Function Tests: Recent Labs  Lab 10/31/21 2306  AST 43*  ALT 46*  ALKPHOS 71  BILITOT 1.4*  PROT 9.1*  ALBUMIN 4.5   No results for input(s): LIPASE, AMYLASE in the last 168 hours. No results for input(s): AMMONIA in the last 168 hours. CBC: Recent Labs  Lab 10/31/21 2306 11/02/21 1157  WBC 12.1* 8.7  NEUTROABS 5.1 3.1  HGB 10.7* 8.9*  HCT 30.4* 24.3*  MCV 83.7 83.5  PLT 528* 541*   Cardiac Enzymes: No results for input(s): CKTOTAL, CKMB, CKMBINDEX, TROPONINI in the last 168 hours. BNP: Invalid input(s): POCBNP CBG: No results for input(s): GLUCAP in the last 168 hours.  Time coordinating discharge: 30 minutes  Signed:  [name]   APRN, MSN, FNP-C [facility] [address] [place], [place] [address] [phone]  [facility] 11/04/2021, 12:20 PM

## 2021-11-04 NOTE — Progress Notes (Signed)
AVS given and reviewed with pt. Medications discussed. All questions answered to satisfaction. Pt verbalized understanding of information given. Pt ambulated off the unit with all belongings, accompanied by staff member.

## 2021-11-04 NOTE — Progress Notes (Signed)
Pt was uncooperative with routine assessment and refused to have same done. CN aware. Will re-attempt later in the shift.

## 2021-11-05 NOTE — Discharge Summary (Addendum)
Physician Discharge Summary  Carmen [name] RWE:[id number] DOB: 1991/04/04 DOA: 10/31/2021  PCP: Vevelyn Francois, NP  Admit date: 10/31/2021  Discharge date: 11/04/2021  Discharge Diagnoses:  Principal Problem:   Sickle cell pain crisis ([place]) Active Problems:   Anemia of chronic disease   Sickle cell anemia with crisis ([place])   Chronic pain syndrome   Discharge Condition: Stable  Disposition:   Follow-up Information     Vevelyn Francois, NP Follow up.   Specialty: Adult Health Nurse Practitioner Contact information: [address] [name] [address] [phone]         [name], FNP Follow up.   Specialty: Family Medicine Contact information: [place]. [place] [address] [phone]                Pt is discharged home in good condition and is to follow up with Vevelyn Francois, NP this week to have labs evaluated. Carmen [name] is instructed to increase activity slowly and balance with rest for the next few days, and use prescribed medication to complete treatment of pain  Diet: Regular Wt Readings from Last 3 Encounters:  11/01/21 86.2 kg  10/26/21 86.2 kg  10/20/21 86.2 kg    History of present illness:  Carmen [name] is a 31 year old female with a medical history significant for was admitted with a minimum abdomen chronic pain syndrome, and major depressive disorder who presents to the emergency department secondary to pain all over.  Patient recently discharged from the hospital approximately 48 hours prior to this admission due to another Sickle cell pain crisis.  Patient symptoms continue to worsen despite taking medication and also expressed difficulties getting her prescriptions refilled as it is too early for them.  While in the emergency department, patient received fluid resuscitation and 3 rounds of IV analgesics without successful improvement in her symptoms.  TRH has been called to place in the hospital for  further evaluation and management. COVID PCR was negative, chest x-ray without acute cardiopulmonary process, normal respiratory rate and excellent oxygen saturation on room air blood work overall is at baseline for her.  Hospital Course:  Sickle cell disease with pain crisis: Patient was admitted for sickle cell pain crisis and managed appropriately with IVF, IV Dilaudid via PCA and IV Toradol, as well as other adjunct therapies per sickle cell pain management protocols.  IV Dilaudid PCA weaned appropriately and patient transition to her home medications.  Spoke to patient's PCP, who will send her opiate medications to pharmacy.  Patient's pain intensity is 3/10 and she is requesting discharge home.  She is alert, oriented, and ambulating without assistance.  Patient will discharge home in hemodynamically stable condition.  Patient was therefore discharged home today in a hemodynamically stable condition.   Carmen [name] will follow-up with PCP within 1 week of this discharge. Carmen [name] was counseled extensively about nonpharmacologic means of pain management, patient verbalized understanding  We discussed the need for good hydration, monitoring of hydration status, avoidance of heat, cold, stress, and infection triggers. We discussed the need to be adherent with home medications. Patient was reminded of the need to seek medical attention immediately if any symptom of bleeding, anemia, or infection occurs.  Discharge Exam: Vitals:   11/04/21 1017 11/04/21 1331  BP: 111/83   Pulse: 94   Resp: 18 15  Temp: 98 F (36.7 C)   SpO2: 95% 96%   Vitals:   11/04/21 0616 11/04/21 0837 11/04/21  1017 11/04/21 1331  BP: 109/65  111/83   Pulse: 88  94   Resp: 17 12 18 15   Temp: 98.2 F (36.8 C)  98 F (36.7 C)   TempSrc: Oral  Oral   SpO2: 98% 100% 95% 96%  Weight:      Height:        General appearance : Awake, alert, not in any distress. Speech Clear. Not toxic looking HEENT: Atraumatic and Normocephalic,  pupils equally reactive to light and accomodation Neck: Supple, no JVD. No cervical lymphadenopathy.  Chest: Good air entry bilaterally, no added sounds  CVS: S1 S2 regular, no murmurs.  Abdomen: Bowel sounds present, Non tender and not distended with no gaurding, rigidity or rebound. Extremities: B/L Lower Ext shows no edema, both legs are warm to touch Neurology: Awake alert, and oriented X 3, CN II-XII intact, Non focal Skin: No Rash  Discharge Instructions  Discharge Instructions     Discharge patient   Complete by: As directed    Discharge disposition: 01-Home or Self Care   Discharge patient date: 11/04/2021      Allergies as of 11/04/2021       Reactions   Ketamine Hives, Other (See Comments)   Makes feel funny        Medication List     TAKE these medications    betamethasone valerate ointment 0.1 % Commonly known as: VALISONE Apply 1 application topically in the morning and at bedtime.   citalopram 20 MG tablet Commonly known as: CELEXA TAKE 1 TABLET BY MOUTH EVERY DAY What changed:  when to take this reasons to take this   cyclobenzaprine 10 MG tablet Commonly known as: FLEXERIL TAKE 1 TABLET BY MOUTH TWICE A DAY What changed:  when to take this reasons to take this   DULoxetine 30 MG capsule Commonly known as: CYMBALTA Take 2 capsules (60 mg total) by mouth daily.   folic acid 1 MG tablet Commonly known as: FOLVITE Take 1 tablet (1 mg total) by mouth daily.   Prenatal Vitamin Plus Low Iron 27-1 MG Tabs TAKE 1 TABLET BY MOUTH EVERY DAY   Vitamin D (Ergocalciferol) 1.25 MG (50000 UNIT) Caps capsule Commonly known as: DRISDOL TAKE ONE CAPSULE BY MOUTH ONE TIME PER WEEK What changed:  how much to take how to take this when to take this additional instructions        The results of significant diagnostics from this hospitalization (including imaging, microbiology, ancillary and laboratory) are listed below for reference.    Significant  Diagnostic Studies: DG Chest 2 View  Result Date: 10/31/2021 CLINICAL DATA:  Sickle cell crisis.  Shortness of breath. EXAM: CHEST - 2 VIEW COMPARISON:  Chest x-ray 06/19/2021. FINDINGS: The heart size and mediastinal contours are within normal limits. Both lungs are clear. The visualized skeletal structures are unremarkable. IMPRESSION: No active cardiopulmonary disease. Electronically Signed   By: Ronney Asters M.D.   On: 10/31/2021 19:41    Microbiology: Recent Results (from the past 240 hour(s))  Resp Panel by RT-PCR (Flu A&B, Covid) Nasopharyngeal Swab     Status: None   Collection Time: 10/26/21  3:12 PM   Specimen: Nasopharyngeal Swab; Nasopharyngeal(NP) swabs in vial transport medium  Result Value Ref Range Status   SARS Coronavirus 2 by RT PCR NEGATIVE NEGATIVE Final    Comment: (NOTE) SARS-CoV-2 target nucleic acids are NOT DETECTED.  The SARS-CoV-2 RNA is generally detectable in upper respiratory specimens during the acute phase of infection. The  lowest concentration of SARS-CoV-2 viral copies this assay can detect is 138 copies/mL. A negative result does not preclude SARS-Cov-2 infection and should not be used as the sole basis for treatment or other patient management decisions. A negative result may occur with  improper specimen collection/handling, submission of specimen other than nasopharyngeal swab, presence of viral mutation(s) within the areas targeted by this assay, and inadequate number of viral copies(<138 copies/mL). A negative result must be combined with clinical observations, patient history, and epidemiological information. The expected result is Negative.  Fact Sheet for Patients:  EntrepreneurPulse.com.au  Fact Sheet for Healthcare Providers:  IncredibleEmployment.be  This test is no t yet approved or cleared by the [place] FDA and  has been authorized for detection and/or diagnosis of SARS-CoV-2 by FDA under  an Emergency Use Authorization (EUA). This EUA will remain  in effect (meaning this test can be used) for the duration of the COVID-19 declaration under Section 564(b)(1) of the Act, 21 U.S.C.section 360bbb-3(b)(1), unless the authorization is terminated  or revoked sooner.       Influenza A by PCR NEGATIVE NEGATIVE Final   Influenza B by PCR NEGATIVE NEGATIVE Final    Comment: (NOTE) The Xpert Xpress SARS-CoV-2/FLU/RSV plus assay is intended as an aid in the diagnosis of influenza from Nasopharyngeal swab specimens and should not be used as a sole basis for treatment. Nasal washings and aspirates are unacceptable for Xpert Xpress SARS-CoV-2/FLU/RSV testing.  Fact Sheet for Patients: EntrepreneurPulse.com.au  Fact Sheet for Healthcare Providers: IncredibleEmployment.be  This test is not yet approved or cleared by the [place] FDA and has been authorized for detection and/or diagnosis of SARS-CoV-2 by FDA under an Emergency Use Authorization (EUA). This EUA will remain in effect (meaning this test can be used) for the duration of the COVID-19 declaration under Section 564(b)(1) of the Act, 21 U.S.C. section 360bbb-3(b)(1), unless the authorization is terminated or revoked.  Performed at [facility], [place] [address] [place], [place] [address]   Resp Panel by RT-PCR (Flu A&B, Covid) Nasopharyngeal Swab     Status: None   Collection Time: 11/01/21  2:01 AM   Specimen: Nasopharyngeal Swab; Nasopharyngeal(NP) swabs in vial transport medium  Result Value Ref Range Status   SARS Coronavirus 2 by RT PCR NEGATIVE NEGATIVE Final    Comment: (NOTE) SARS-CoV-2 target nucleic acids are NOT DETECTED.  The SARS-CoV-2 RNA is generally detectable in upper respiratory specimens during the acute phase of infection. The lowest concentration of SARS-CoV-2 viral copies this assay can detect is 138 copies/mL. A negative result does not  preclude SARS-Cov-2 infection and should not be used as the sole basis for treatment or other patient management decisions. A negative result may occur with  improper specimen collection/handling, submission of specimen other than nasopharyngeal swab, presence of viral mutation(s) within the areas targeted by this assay, and inadequate number of viral copies(<138 copies/mL). A negative result must be combined with clinical observations, patient history, and epidemiological information. The expected result is Negative.  Fact Sheet for Patients:  EntrepreneurPulse.com.au  Fact Sheet for Healthcare Providers:  IncredibleEmployment.be  This test is no t yet approved or cleared by the [place] FDA and  has been authorized for detection and/or diagnosis of SARS-CoV-2 by FDA under an Emergency Use Authorization (EUA). This EUA will remain  in effect (meaning this test can be used) for the duration of the COVID-19 declaration under Section 564(b)(1) of the Act, 21 U.S.C.section 360bbb-3(b)(1), unless the authorization is  terminated  or revoked sooner.       Influenza A by PCR NEGATIVE NEGATIVE Final   Influenza B by PCR NEGATIVE NEGATIVE Final    Comment: (NOTE) The Xpert Xpress SARS-CoV-2/FLU/RSV plus assay is intended as an aid in the diagnosis of influenza from Nasopharyngeal swab specimens and should not be used as a sole basis for treatment. Nasal washings and aspirates are unacceptable for Xpert Xpress SARS-CoV-2/FLU/RSV testing.  Fact Sheet for Patients: EntrepreneurPulse.com.au  Fact Sheet for Healthcare Providers: IncredibleEmployment.be  This test is not yet approved or cleared by the [place] FDA and has been authorized for detection and/or diagnosis of SARS-CoV-2 by FDA under an Emergency Use Authorization (EUA). This EUA will remain in effect (meaning this test can be used) for the  duration of the COVID-19 declaration under Section 564(b)(1) of the Act, 21 U.S.C. section 360bbb-3(b)(1), unless the authorization is terminated or revoked.  Performed at [facility], [place] [address], [place], [place] [address]      Labs: Basic Metabolic Panel: Recent Labs  Lab 10/31/21 2306  NA 135  K 4.2  CL 104  CO2 24  GLUCOSE 108*  BUN 19  CREATININE 0.74  CALCIUM 9.7   Liver Function Tests: Recent Labs  Lab 10/31/21 2306  AST 43*  ALT 46*  ALKPHOS 71  BILITOT 1.4*  PROT 9.1*  ALBUMIN 4.5   No results for input(s): LIPASE, AMYLASE in the last 168 hours. No results for input(s): AMMONIA in the last 168 hours. CBC: Recent Labs  Lab 10/31/21 2306 11/02/21 1157  WBC 12.1* 8.7  NEUTROABS 5.1 3.1  HGB 10.7* 8.9*  HCT 30.4* 24.3*  MCV 83.7 83.5  PLT 528* 541*   Cardiac Enzymes: No results for input(s): CKTOTAL, CKMB, CKMBINDEX, TROPONINI in the last 168 hours. BNP: Invalid input(s): POCBNP CBG: No results for input(s): GLUCAP in the last 168 hours.  Time coordinating discharge: 30 minutes  Signed: [name]  APRN, MSN, FNP-C Patient [place] [address] [place], [place] [address] [phone]  [facility] 11/05/2021, 9:51 AM   Evaluation and management procedures were performed by the Advanced Practitioner under my supervision and collaboration. I have reviewed the Advanced Practitioner's note and chart, and I agree with the management and plan.   Angelica Chessman, MD, MHA, CPE, [place], [place] Secretary, [place] 11/05/2021, 2:22 PM

## 2021-11-12 ENCOUNTER — Other Ambulatory Visit: Payer: Self-pay

## 2021-11-12 ENCOUNTER — Ambulatory Visit (INDEPENDENT_AMBULATORY_CARE_PROVIDER_SITE_OTHER): Payer: Medicaid Other | Admitting: Nurse Practitioner

## 2021-11-12 DIAGNOSIS — Z30013 Encounter for initial prescription of injectable contraceptive: Secondary | ICD-10-CM

## 2021-11-12 DIAGNOSIS — Z30019 Encounter for initial prescription of contraceptives, unspecified: Secondary | ICD-10-CM | POA: Diagnosis not present

## 2021-11-12 LAB — POCT URINE PREGNANCY: Preg Test, Ur: NEGATIVE

## 2021-11-12 MED ORDER — MEDROXYPROGESTERONE ACETATE 150 MG/ML IM SUSP
150.0000 mg | Freq: Once | INTRAMUSCULAR | Status: AC
  Administered 2021-11-12: 150 mg via INTRAMUSCULAR

## 2021-11-12 NOTE — Progress Notes (Signed)
Pt came for contraception shot  Pregnancy is negative Next shot dates March 30- April 13

## 2021-11-19 ENCOUNTER — Other Ambulatory Visit: Payer: Self-pay | Admitting: Nurse Practitioner

## 2021-11-19 MED ORDER — OXYCODONE HCL 10 MG PO TABS: 10.0000 mg | ORAL_TABLET | Freq: Four times a day (QID) | ORAL | 0 refills | Status: DC | PRN

## 2021-11-24 ENCOUNTER — Telehealth: Payer: Self-pay

## 2021-11-24 NOTE — Telephone Encounter (Signed)
Transition Care Management Unsuccessful Follow-up Telephone Call  Date of discharge and from where:  11/04/2021  [facility]  Attempts:  1st Attempt  Reason for unsuccessful TCM follow-up call:  No answer/busy Tomasa Rand, RN, BSN, CEN [place] Coordinator [phone]

## 2021-11-25 ENCOUNTER — Telehealth: Payer: Self-pay | Admitting: *Deleted

## 2021-11-25 NOTE — Telephone Encounter (Signed)
Transition Care Management Follow-up Telephone Call Date of discharge and from where: 11/04/21  [facility] How have you been since you were released from the hospital? "Doing well and I'm back at work" Any questions or concerns? No  Items Reviewed: Did the pt receive and understand the discharge instructions provided? Yes  Medications obtained and verified? Yes  Other? No  Any new allergies since your discharge? No  Dietary orders reviewed? Yes Do you have support at home? Yes   Home Care and Equipment/Supplies: Were home health services ordered? no If so, what is the name of the agency? N/a  Has the agency set up a time to come to the patient's home? not applicable Were any new equipment or medical supplies ordered?  No What is the name of the medical supply agency? N/a Were you able to get the supplies/equipment? not applicable Do you have any questions related to the use of the equipment or supplies? N/a  Functional Questionnaire: (I = Independent and D = Dependent) ADLs: I  Bathing/Dressing- I  Meal Prep- I  Eating- I  Maintaining continence- I  Transferring/Ambulation- I  Managing Meds- I  Follow up appointments reviewed:  PCP Hospital f/u appt confirmed? No   Specialist Hospital f/u appt confirmed? No   Are transportation arrangements needed? No  If their condition worsens, is the pt aware to call PCP or go to the Emergency Dept.? Yes Was the patient provided with contact information for the PCP's office or ED? Yes Was to pt encouraged to call back with questions or concerns? Yes   Jacqlyn Larsen [facility], BSN RN Case Manager [phone]

## 2021-12-02 ENCOUNTER — Encounter (HOSPITAL_COMMUNITY): Payer: Self-pay

## 2021-12-02 ENCOUNTER — Telehealth (HOSPITAL_COMMUNITY): Payer: Self-pay

## 2021-12-02 ENCOUNTER — Inpatient Hospital Stay (HOSPITAL_COMMUNITY)
Admission: EM | Admit: 2021-12-02 | Discharge: 2021-12-05 | DRG: 812 | Discharge status: Against Medical Advice | Payer: Medicaid Other | Attending: Internal Medicine | Admitting: Internal Medicine

## 2021-12-02 ENCOUNTER — Other Ambulatory Visit: Payer: Self-pay | Admitting: Nurse Practitioner

## 2021-12-02 DIAGNOSIS — Z884 Allergy status to anesthetic agent status: Secondary | ICD-10-CM

## 2021-12-02 DIAGNOSIS — G894 Chronic pain syndrome: Secondary | ICD-10-CM | POA: Diagnosis present

## 2021-12-02 DIAGNOSIS — D75839 Thrombocytosis, unspecified: Secondary | ICD-10-CM | POA: Diagnosis present

## 2021-12-02 DIAGNOSIS — D571 Sickle-cell disease without crisis: Secondary | ICD-10-CM

## 2021-12-02 DIAGNOSIS — E669 Obesity, unspecified: Secondary | ICD-10-CM | POA: Diagnosis present

## 2021-12-02 DIAGNOSIS — F17210 Nicotine dependence, cigarettes, uncomplicated: Secondary | ICD-10-CM | POA: Diagnosis present

## 2021-12-02 DIAGNOSIS — Z20822 Contact with and (suspected) exposure to covid-19: Secondary | ICD-10-CM | POA: Diagnosis present

## 2021-12-02 DIAGNOSIS — Z5329 Procedure and treatment not carried out because of patient's decision for other reasons: Secondary | ICD-10-CM | POA: Diagnosis not present

## 2021-12-02 DIAGNOSIS — Z79899 Other long term (current) drug therapy: Secondary | ICD-10-CM

## 2021-12-02 DIAGNOSIS — F1120 Opioid dependence, uncomplicated: Secondary | ICD-10-CM | POA: Diagnosis present

## 2021-12-02 DIAGNOSIS — Z832 Family history of diseases of the blood and blood-forming organs and certain disorders involving the immune mechanism: Secondary | ICD-10-CM

## 2021-12-02 DIAGNOSIS — F329 Major depressive disorder, single episode, unspecified: Secondary | ICD-10-CM | POA: Diagnosis present

## 2021-12-02 DIAGNOSIS — D5700 Hb-SS disease with crisis, unspecified: Principal | ICD-10-CM | POA: Diagnosis present

## 2021-12-02 DIAGNOSIS — D72829 Elevated white blood cell count, unspecified: Secondary | ICD-10-CM | POA: Diagnosis present

## 2021-12-02 DIAGNOSIS — Z6833 Body mass index (BMI) 33.0-33.9, adult: Secondary | ICD-10-CM

## 2021-12-02 DIAGNOSIS — Z9081 Acquired absence of spleen: Secondary | ICD-10-CM

## 2021-12-02 DIAGNOSIS — Z79891 Long term (current) use of opiate analgesic: Secondary | ICD-10-CM

## 2021-12-02 LAB — I-STAT BETA HCG BLOOD, ED (MC, WL, AP ONLY): I-stat hCG, quantitative: <5 m[IU]/mL (ref <5)

## 2021-12-02 LAB — CBC WITH DIFFERENTIAL/PLATELET
Abs Immature Granulocytes: 0.04 10*3/uL (ref 0.00–0.07)
Basophils Absolute: 0 10*3/uL (ref 0.0–0.1)
Basophils Relative: 0 %
Eosinophils Absolute: 0.3 10*3/uL (ref 0.0–0.5)
Eosinophils Relative: 2 %
HCT: 30.8 % — ABNORMAL LOW (ref 36.0–46.0)
Hemoglobin: 10.9 g/dL — ABNORMAL LOW (ref 12.0–15.0)
Immature Granulocytes: 0 %
Lymphocytes Relative: 27 %
Lymphs Abs: 3 10*3/uL (ref 0.7–4.0)
MCH: 30 pg (ref 26.0–34.0)
MCHC: 35.4 g/dL (ref 30.0–36.0)
MCV: 84.8 fL (ref 80.0–100.0)
Monocytes Absolute: 1 10*3/uL (ref 0.1–1.0)
Monocytes Relative: 9 %
Neutro Abs: 6.8 10*3/uL (ref 1.7–7.7)
Neutrophils Relative %: 62 %
Platelets: 500 10*3/uL — ABNORMAL HIGH (ref 150–400)
RBC: 3.63 MIL/uL — ABNORMAL LOW (ref 3.87–5.11)
RDW: 14.1 % (ref 11.5–15.5)
WBC: 11.2 10*3/uL — ABNORMAL HIGH (ref 4.0–10.5)
nRBC: 0.2 % (ref 0.0–0.2)

## 2021-12-02 LAB — COMPREHENSIVE METABOLIC PANEL
ALT: 36 U/L (ref 0–44)
AST: 34 U/L (ref 15–41)
Albumin: 4.6 g/dL (ref 3.5–5.0)
Alkaline Phosphatase: 61 U/L (ref 38–126)
Anion gap: 8 (ref 5–15)
BUN: 10 mg/dL (ref 6–20)
CO2: 24 mmol/L (ref 22–32)
Calcium: 9.6 mg/dL (ref 8.9–10.3)
Chloride: 104 mmol/L (ref 98–111)
Creatinine, Ser: 0.64 mg/dL (ref 0.44–1.00)
GFR, Estimated: >60 mL/min (ref >60)
Glucose, Bld: 97 mg/dL (ref 70–99)
Potassium: 3.8 mmol/L (ref 3.5–5.1)
Sodium: 136 mmol/L (ref 135–145)
Total Bilirubin: 1 mg/dL (ref 0.3–1.2)
Total Protein: 8.9 g/dL — ABNORMAL HIGH (ref 6.5–8.1)

## 2021-12-02 LAB — RETICULOCYTES
Immature Retic Fract: 32.5 % — ABNORMAL HIGH (ref 2.3–15.9)
RBC.: 3.58 MIL/uL — ABNORMAL LOW (ref 3.87–5.11)
Retic Count, Absolute: 122.1 10*3/uL (ref 19.0–186.0)
Retic Ct Pct: 3.4 % — ABNORMAL HIGH (ref 0.4–3.1)

## 2021-12-02 LAB — RESP PANEL BY RT-PCR (FLU A&B, COVID) ARPGX2
Influenza A by PCR: NEGATIVE
Influenza B by PCR: NEGATIVE
SARS Coronavirus 2 by RT PCR: NEGATIVE

## 2021-12-02 MED ORDER — PROMETHAZINE HCL 25 MG PO TABS
25.0000 mg | ORAL_TABLET | ORAL | Status: DC | PRN
  Administered 2021-12-03: 25 mg via ORAL
  Filled 2021-12-02 (×2): qty 1

## 2021-12-02 MED ORDER — KETOROLAC TROMETHAMINE 15 MG/ML IJ SOLN
15.0000 mg | INTRAMUSCULAR | Status: AC
  Administered 2021-12-02: 15 mg via INTRAVENOUS
  Filled 2021-12-02: qty 1

## 2021-12-02 MED ORDER — MORPHINE SULFATE ER 60 MG PO TBCR: 60.0000 mg | EXTENDED_RELEASE_TABLET | Freq: Two times a day (BID) | ORAL | 0 refills | Status: DC

## 2021-12-02 MED ORDER — DEXTROSE-NACL 5-0.45 % IV SOLN: INTRAVENOUS | Status: DC

## 2021-12-02 MED ORDER — HYDROMORPHONE HCL 2 MG/ML IJ SOLN
2.0000 mg | INTRAMUSCULAR | Status: AC
  Administered 2021-12-02: 2 mg via INTRAVENOUS
  Filled 2021-12-02: qty 1

## 2021-12-02 MED ORDER — SODIUM CHLORIDE 0.9 % IV SOLN
12.5000 mg | Freq: Once | INTRAVENOUS | Status: AC
  Administered 2021-12-02: 12.5 mg via INTRAVENOUS
  Filled 2021-12-02: qty 0.25

## 2021-12-02 MED ORDER — OXYCODONE HCL 10 MG PO TABS: 10.0000 mg | ORAL_TABLET | Freq: Four times a day (QID) | ORAL | 0 refills | Status: DC | PRN

## 2021-12-02 NOTE — Telephone Encounter (Signed)
Pt called to be seen in the day hospital. Instructed pt to go to ED for care today per [place] FNP, pt verbalized understanding.

## 2021-12-02 NOTE — ED Triage Notes (Signed)
Pt arrived via POV, c/o SCC, pain in arms. Unable to control with meds at home.

## 2021-12-02 NOTE — ED Notes (Signed)
Pt does not want labs drawn in triage.

## 2021-12-02 NOTE — ED Notes (Signed)
Patient stated she will wait until she goes to the back for lab draw

## 2021-12-02 NOTE — ED Provider Triage Note (Signed)
Emergency Medicine Provider Triage Evaluation Note  RONAN DION , a 31 y.o. female  was evaluated in triage.  Pt complains of sickle cell pain flair.  Its in her arms and back.  Started Monday morning.   She tried her home meds with out relief.  She denies CP/SHOB.  She feels like this is a pain flair.    Physical Exam  BP 123/79 (BP Location: Left Arm)    Pulse (!) 105    Temp 99.4 F (37.4 C) (Oral)    SpO2 98%  Gen:   Awake, no distress   Resp:  Normal effort  MSK:   Moves extremities without difficulty  Other:  Normal speech.   Medical Decision Making  Medically screening exam initiated at 12:20 PM.  Appropriate orders placed.  KINDA POTTLE was informed that the remainder of the evaluation will be completed by another provider, this initial triage assessment does not replace that evaluation, and the importance of remaining in the ED until their evaluation is complete.  Note: Portions of this report may have been transcribed using voice recognition software. Every effort was made to ensure accuracy; however, inadvertent computerized transcription errors may be present     Lorin Glass, PA-C 12/02/21 1226

## 2021-12-02 NOTE — ED Provider Notes (Signed)
[facility] DEPT Provider Note   CSN: [id number] Arrival date & time: 12/02/21  1123     History  Chief Complaint  Patient presents with   Sickle Cell Pain Crisis    Carmen [name] is a 31 y.o. female.  The history is provided by the patient and medical records. No language interpreter was used.  Sickle Cell Pain Crisis Location:  Upper extremity and back Severity:  Severe Onset quality:  Gradual Duration:  3 days Similar to previous crisis episodes: yes   Timing:  Constant Progression:  Worsening Chronicity:  Recurrent Context: cold exposure (weather changes worsen it)   Relieved by:  Nothing Worsened by:  Nothing Ineffective treatments:  Prescription drugs Associated symptoms: no chest pain, no congestion, no cough, no fatigue, no fever, no headaches, no nausea, no shortness of breath, no swelling of legs, no vomiting and no wheezing       Home Medications Prior to Admission medications   Medication Sig Start Date End Date Taking? Authorizing Provider  betamethasone valerate ointment (VALISONE) 0.1 % Apply 1 application topically in the morning and at bedtime. 10/10/21   [provider]  citalopram (CELEXA) 20 MG tablet TAKE 1 TABLET BY MOUTH EVERY DAY Patient taking differently: Take 20 mg by mouth daily as needed (anxiety). 02/24/21   [name], FNP  cyclobenzaprine (FLEXERIL) 10 MG tablet TAKE 1 TABLET BY MOUTH TWICE A DAY Patient taking differently: Take 10 mg by mouth 2 (two) times daily as needed for muscle spasms. 10/12/21   Vevelyn Francois, NP  DULoxetine (CYMBALTA) 30 MG capsule Take 2 capsules (60 mg total) by mouth daily. 10/21/21 04/19/22  Vevelyn Francois, NP  folic acid (FOLVITE) 1 MG tablet Take 1 tablet (1 mg total) by mouth daily. 03/15/19   Lanae Boast, FNP  morphine (MS CONTIN) 60 MG 12 hr tablet Take 1 tablet (60 mg total) by mouth every 12 (twelve) hours. 12/04/21 01/03/22  Vevelyn Francois, NP  Oxycodone HCl  10 MG TABS Take 1 tablet (10 mg total) by mouth every 6 (six) hours as needed for up to 15 days (pain). 12/04/21 12/19/21  Vevelyn Francois, NP  Prenatal Vit-Fe Fumarate-FA (PRENATAL VITAMIN PLUS LOW IRON) 27-1 MG TABS TAKE 1 TABLET BY MOUTH EVERY DAY Patient taking differently: Take 1 tablet by mouth daily. 03/03/20   Shelly Bombard, MD  Vitamin D, Ergocalciferol, (DRISDOL) 1.25 MG (50000 UNIT) CAPS capsule TAKE ONE CAPSULE BY MOUTH ONE TIME PER WEEK Patient taking differently: Take 50,000 Units by mouth every Wednesday. 09/03/20   Vevelyn Francois, NP      Allergies    Ketamine    Review of Systems   Review of Systems  Constitutional:  Negative for chills, fatigue and fever.  HENT:  Negative for congestion.   Eyes:  Negative for visual disturbance.  Respiratory:  Negative for cough, chest tightness, shortness of breath and wheezing.   Cardiovascular:  Negative for chest pain, palpitations and leg swelling.  Gastrointestinal:  Negative for abdominal pain, constipation, diarrhea, nausea and vomiting.  Genitourinary:  Negative for dysuria.  Musculoskeletal:  Positive for back pain. Negative for neck pain and neck stiffness.  Skin:  Negative for rash and wound.  Neurological:  Negative for light-headedness and headaches.  Psychiatric/Behavioral:  Negative for agitation.   All other systems reviewed and are negative.  Physical Exam Updated Vital Signs BP 104/69    Pulse 74    Temp 99.4 F (37.4  C) (Oral)    Resp 19    Ht 5\' 3"  (1.6 m)    Wt 86.2 kg    SpO2 100%    BMI 33.66 kg/m  Physical Exam Vitals and nursing note reviewed.  Constitutional:      General: She is not in acute distress.    Appearance: She is well-developed. She is not ill-appearing, toxic-appearing or diaphoretic.  HENT:     Head: Normocephalic and atraumatic.     Nose: No congestion or rhinorrhea.  Eyes:     Extraocular Movements: Extraocular movements intact.     Conjunctiva/sclera: Conjunctivae normal.     Pupils:  Pupils are equal, round, and reactive to light.  Cardiovascular:     Rate and Rhythm: Normal rate and regular rhythm.     Heart sounds: No murmur heard. Pulmonary:     Effort: Pulmonary effort is normal. No respiratory distress.     Breath sounds: Normal breath sounds. No wheezing, rhonchi or rales.  Chest:     Chest wall: No tenderness.  Abdominal:     General: Abdomen is flat.     Palpations: Abdomen is soft.     Tenderness: There is no abdominal tenderness. There is no right CVA tenderness, left CVA tenderness, guarding or rebound.  Musculoskeletal:        General: Tenderness present. No swelling.     Cervical back: Neck supple. No tenderness.     Right lower leg: No edema.     Left lower leg: No edema.     Comments: Tenderness in both arms and across the upper back.  Intact sensation, strength, and pulses in upper extremities.  No edema seen  Skin:    General: Skin is warm and dry.     Capillary Refill: Capillary refill takes less than 2 seconds.     Findings: No erythema.  Neurological:     General: No focal deficit present.     Mental Status: She is alert.  Psychiatric:        Mood and Affect: Mood normal.    ED Results / Procedures / Treatments   Labs (all labs ordered are listed, but only abnormal results are displayed) Labs Reviewed  COMPREHENSIVE METABOLIC PANEL - Abnormal; Notable for the following components:      Result Value   Total Protein 8.9 (*)    All other components within normal limits  CBC WITH DIFFERENTIAL/PLATELET - Abnormal; Notable for the following components:   WBC 11.2 (*)    RBC 3.63 (*)    Hemoglobin 10.9 (*)    HCT 30.8 (*)    Platelets 500 (*)    All other components within normal limits  RETICULOCYTES - Abnormal; Notable for the following components:   Retic Ct Pct 3.4 (*)    RBC. 3.58 (*)    Immature Retic Fract 32.5 (*)    All other components within normal limits  RESP PANEL BY RT-PCR (FLU A&B, COVID) [id number]  I-STAT BETA HCG  BLOOD, ED ([facility], [facility], [facility] ONLY)    EKG None  Radiology No results found.  Procedures Procedures    Medications Ordered in ED Medications  dextrose 5 %-0.45 % sodium chloride infusion ( Intravenous New Bag/Given 12/02/21 1723)  promethazine (PHENERGAN) tablet 25 mg (has no administration in time range)  ketorolac (TORADOL) 15 MG/ML injection 15 mg (15 mg Intravenous Given 12/02/21 1723)  HYDROmorphone (DILAUDID) injection 2 mg (2 mg Intravenous Given 12/02/21 1724)  HYDROmorphone (DILAUDID) injection 2 mg (2 mg  Intravenous Given 12/02/21 1824)  HYDROmorphone (DILAUDID) injection 2 mg (2 mg Intravenous Given 12/02/21 1850)  diphenhydrAMINE (BENADRYL) 12.5 mg in sodium chloride 0.9 % 50 mL IVPB (0 mg Intravenous Stopped 12/02/21 1824)    ED Course/ Medical Decision Making/ A&P                           Medical Decision Making Problems Addressed: Sickle cell pain crisis [facility]): acute illness or injury  Amount and/or Complexity of Data Reviewed Labs: ordered.  Risk Prescription drug management. Decision regarding hospitalization.   Carmen [name] is a 31 y.o. female with a past medical history significant for sickle cell disease who presents with sickle cell pain crisis.  According to patient, she has had pain typical with her pain crisis in her upper back and bilateral arms.  She reports it is worsened since Monday and after calling the sickle cell team, they told her to come to the emergency department as the clinic is closed today.  She reports no fevers, chills, congestion, cough, chest pain, shortness breath, nausea, vomiting, constipation, or diarrhea.  No other complaints.  She reports her home medications have not been helping and pain is 10 out of 10.  She says does not feel anything like acute chest or pneumonia or any other complications.  Patient is concerned that her symptoms may not be able to be managed completely in the emergency department and she may need admission.  On exam,  lungs clear chest nontender.  Back is tender as are her arms but she has intact sensation, strength, and pulses in upper extremities.  Abdomen nontender.  Exam otherwise unremarkable.  Patient had lab work started in triage which appears similar to prior which I personally reviewed in the chart.  No evidence of need for blood transfusion at this time.  We will start sickle cell medication regimen per the order set and will reassess.  Anticipate either admission or discharge based on her response to management.  Labs are similar to prior.  Patient is still just below febrile.  Will get COVID and flu test.  I reassessed patient and she says that she is unable to control her pain despite multiple doses of the Dilaudid.  She does not feel safe going home especially since she tried to follow-up with the sickle cell team today and it was closed.  We will call for admission for further management of sickle cell pain that we have not yet achieved control over.   10:21 PM Hospitalist will admit but due to holding it may be a while before she gets a bed and get seen by hospitalist team.         Final Clinical Impression(s) / ED Diagnoses Final diagnoses:  Sickle cell pain crisis ([place])     Clinical Impression: 1. Sickle cell pain crisis ([place])     Disposition: Admit  This note was prepared with assistance of Dragon voice recognition software. Occasional wrong-word or sound-a-like substitutions may have occurred due to the inherent limitations of voice recognition software.     Ione [place], Gwenyth Allegra, MD 12/02/21 2221

## 2021-12-03 ENCOUNTER — Other Ambulatory Visit: Payer: Self-pay

## 2021-12-03 DIAGNOSIS — E669 Obesity, unspecified: Secondary | ICD-10-CM | POA: Diagnosis present

## 2021-12-03 DIAGNOSIS — D5700 Hb-SS disease with crisis, unspecified: Principal | ICD-10-CM

## 2021-12-03 DIAGNOSIS — Z884 Allergy status to anesthetic agent status: Secondary | ICD-10-CM | POA: Diagnosis not present

## 2021-12-03 DIAGNOSIS — F17210 Nicotine dependence, cigarettes, uncomplicated: Secondary | ICD-10-CM | POA: Diagnosis present

## 2021-12-03 DIAGNOSIS — Z6833 Body mass index (BMI) 33.0-33.9, adult: Secondary | ICD-10-CM | POA: Diagnosis not present

## 2021-12-03 DIAGNOSIS — F329 Major depressive disorder, single episode, unspecified: Secondary | ICD-10-CM | POA: Diagnosis present

## 2021-12-03 DIAGNOSIS — Z832 Family history of diseases of the blood and blood-forming organs and certain disorders involving the immune mechanism: Secondary | ICD-10-CM | POA: Diagnosis not present

## 2021-12-03 DIAGNOSIS — D72829 Elevated white blood cell count, unspecified: Secondary | ICD-10-CM | POA: Diagnosis present

## 2021-12-03 DIAGNOSIS — Z9081 Acquired absence of spleen: Secondary | ICD-10-CM | POA: Diagnosis not present

## 2021-12-03 DIAGNOSIS — G894 Chronic pain syndrome: Secondary | ICD-10-CM | POA: Diagnosis present

## 2021-12-03 DIAGNOSIS — Z79899 Other long term (current) drug therapy: Secondary | ICD-10-CM | POA: Diagnosis not present

## 2021-12-03 DIAGNOSIS — Z20822 Contact with and (suspected) exposure to covid-19: Secondary | ICD-10-CM | POA: Diagnosis present

## 2021-12-03 DIAGNOSIS — Z5329 Procedure and treatment not carried out because of patient's decision for other reasons: Secondary | ICD-10-CM | POA: Diagnosis not present

## 2021-12-03 DIAGNOSIS — D75839 Thrombocytosis, unspecified: Secondary | ICD-10-CM | POA: Diagnosis present

## 2021-12-03 DIAGNOSIS — F1120 Opioid dependence, uncomplicated: Secondary | ICD-10-CM | POA: Diagnosis present

## 2021-12-03 MED ORDER — SODIUM CHLORIDE 0.9 % IV SOLN: 25.0000 mg | Freq: Once | INTRAVENOUS | Status: DC

## 2021-12-03 MED ORDER — DIPHENHYDRAMINE HCL 50 MG/ML IJ SOLN
INTRAMUSCULAR | Status: AC
  Administered 2021-12-03: 25 mg via INTRAVENOUS
  Filled 2021-12-03: qty 1

## 2021-12-03 MED ORDER — CITALOPRAM HYDROBROMIDE 10 MG PO TABS
20.0000 mg | ORAL_TABLET | Freq: Every day | ORAL | Status: DC
  Filled 2021-12-03: qty 2

## 2021-12-03 MED ORDER — HYDROMORPHONE HCL 2 MG/ML IJ SOLN
2.0000 mg | INTRAMUSCULAR | Status: AC
  Administered 2021-12-03: 2 mg via INTRAVENOUS
  Filled 2021-12-03: qty 1

## 2021-12-03 MED ORDER — MORPHINE SULFATE 15 MG PO TABS: 60.0000 mg | ORAL_TABLET | Freq: Once | ORAL | Status: DC

## 2021-12-03 MED ORDER — DEXTROSE-NACL 5-0.45 % IV SOLN: INTRAVENOUS | Status: DC

## 2021-12-03 MED ORDER — HYDROMORPHONE 1 MG/ML IV SOLN
INTRAVENOUS | Status: DC
  Administered 2021-12-03: 12.9 mg via INTRAVENOUS
  Administered 2021-12-03: 30 mg via INTRAVENOUS
  Administered 2021-12-04: 6.6 mg via INTRAVENOUS
  Administered 2021-12-04: 7.8 mg via INTRAVENOUS
  Administered 2021-12-04: 10.2 mg via INTRAVENOUS
  Administered 2021-12-04: 6.6 mg via INTRAVENOUS
  Administered 2021-12-04: 30 mg via INTRAVENOUS
  Administered 2021-12-04: 6 mg via INTRAVENOUS
  Administered 2021-12-04: 30 mg via INTRAVENOUS
  Administered 2021-12-05: 0 mg via INTRAVENOUS
  Filled 2021-12-03 (×2): qty 30

## 2021-12-03 MED ORDER — HYDROMORPHONE HCL 2 MG/ML IJ SOLN
2.0000 mg | INTRAMUSCULAR | Status: AC | PRN
  Administered 2021-12-03: 2 mg via INTRAVENOUS
  Filled 2021-12-03: qty 1

## 2021-12-03 MED ORDER — SODIUM CHLORIDE 0.9% FLUSH: 9.0000 mL | INTRAVENOUS | Status: DC | PRN

## 2021-12-03 MED ORDER — DULOXETINE HCL 60 MG PO CPEP
60.0000 mg | ORAL_CAPSULE | Freq: Every day | ORAL | Status: DC
  Administered 2021-12-03 – 2021-12-04 (×2): 60 mg via ORAL
  Filled 2021-12-03: qty 1
  Filled 2021-12-03: qty 2

## 2021-12-03 MED ORDER — KETOROLAC TROMETHAMINE 15 MG/ML IJ SOLN
15.0000 mg | Freq: Four times a day (QID) | INTRAMUSCULAR | Status: DC
  Administered 2021-12-03 – 2021-12-05 (×7): 15 mg via INTRAVENOUS
  Filled 2021-12-03 (×8): qty 1

## 2021-12-03 MED ORDER — DIPHENHYDRAMINE HCL 25 MG PO CAPS
25.0000 mg | ORAL_CAPSULE | ORAL | Status: DC | PRN
  Filled 2021-12-03: qty 1

## 2021-12-03 MED ORDER — MORPHINE SULFATE ER 15 MG PO TBCR
60.0000 mg | EXTENDED_RELEASE_TABLET | Freq: Two times a day (BID) | ORAL | Status: DC
  Administered 2021-12-03 – 2021-12-04 (×3): 60 mg via ORAL
  Filled 2021-12-03: qty 2
  Filled 2021-12-03 (×3): qty 4

## 2021-12-03 MED ORDER — SENNOSIDES-DOCUSATE SODIUM 8.6-50 MG PO TABS
1.0000 | ORAL_TABLET | Freq: Two times a day (BID) | ORAL | Status: DC
  Administered 2021-12-03 – 2021-12-04 (×4): 1 via ORAL
  Filled 2021-12-03 (×4): qty 1

## 2021-12-03 MED ORDER — HYDROMORPHONE 1 MG/ML IV SOLN
INTRAVENOUS | Status: DC
  Administered 2021-12-03: 30 mg via INTRAVENOUS
  Filled 2021-12-03: qty 30

## 2021-12-03 MED ORDER — POLYETHYLENE GLYCOL 3350 17 G PO PACK: 17.0000 g | PACK | Freq: Every day | ORAL | Status: DC | PRN

## 2021-12-03 MED ORDER — MORPHINE SULFATE ER 15 MG PO TBCR
60.0000 mg | EXTENDED_RELEASE_TABLET | Freq: Once | ORAL | Status: AC
  Administered 2021-12-03: 60 mg via ORAL
  Filled 2021-12-03: qty 4

## 2021-12-03 MED ORDER — FOLIC ACID 1 MG PO TABS
1.0000 mg | ORAL_TABLET | Freq: Every day | ORAL | Status: DC
  Administered 2021-12-03 – 2021-12-04 (×2): 1 mg via ORAL
  Filled 2021-12-03 (×2): qty 1

## 2021-12-03 MED ORDER — CYCLOBENZAPRINE HCL 10 MG PO TABS: 10.0000 mg | ORAL_TABLET | Freq: Two times a day (BID) | ORAL | Status: DC | PRN

## 2021-12-03 MED ORDER — DIPHENHYDRAMINE HCL 50 MG/ML IJ SOLN: 25.0000 mg | Freq: Once | INTRAMUSCULAR | Status: AC

## 2021-12-03 MED ORDER — ENOXAPARIN SODIUM 40 MG/0.4ML IJ SOSY
40.0000 mg | PREFILLED_SYRINGE | INTRAMUSCULAR | Status: DC
  Filled 2021-12-03 (×2): qty 0.4

## 2021-12-03 MED ORDER — NALOXONE HCL 0.4 MG/ML IJ SOLN: 0.4000 mg | INTRAMUSCULAR | Status: DC | PRN

## 2021-12-03 MED ORDER — HYDROMORPHONE HCL 2 MG/ML IJ SOLN
2.0000 mg | INTRAMUSCULAR | Status: DC | PRN
  Administered 2021-12-03 (×2): 2 mg via INTRAVENOUS
  Filled 2021-12-03 (×2): qty 1

## 2021-12-03 NOTE — H&P (Signed)
H&P  Patient Demographics:  Carmen [name], is a 31 y.o. female  MRN: [id number]   DOB - 11/21/1990  Admit Date - 12/02/2021  Outpatient Primary MD for the patient is Vevelyn Francois, NP  Chief Complaint  Patient presents with   Sickle Cell Pain Crisis      HPI:   Carmen [name]  is a 31 y.o. female with a medical history significant  for sickle cell disease, chronic pain syndrome, opiate dependence and tolerance, history of depression, and history of anemia of chronic disease presents to the emergency department with complaints of lower back and upper extremities. Current pain is consistent with previous sickle cell crisis. She attributes current pain crisis to changes in weather. She says that pain intensity has been escalating over the past 3 days and unrelieved by home medications. Patients home medications consists of Morphine 60 mg and Oxycodone. She last had medications this am without sustained relief. Pain intensity is 9/10 characterized as constant and throbbing. She denies headache, chest pain, shortness of breath, urinary symptoms, nausea, vomiting, or diarrhea. No sick contacts, recent travel, or known exposure to COVID 19.   ER Course:  Vital signs show: BP 117/76 (BP Location: Left Arm)    Pulse 72    Temp 98.9 F (37.2 C) (Oral)    Resp 19    Ht 5\' 3"  (1.6 m)    Wt 86.2 kg    SpO2 95%    BMI 33.66 kg/m  Complete blood count shows: Hemoglobin 10.9, WBCs 11.2, and platelets 500,000. Complete metabolic panel unremarkable. COVID 19, influenza A&B negative.  Patient's pain persists despite IV Dilaudid, Toradol, and IV fluids she is thereby admitted to [facility] for further management of her sickle cell pain crisis.   Review of systems:  Review of Systems  Constitutional:  Negative for chills and fever.  HENT: Negative.    Eyes: Negative.   Respiratory: Negative.    Cardiovascular: Negative.   Gastrointestinal: Negative.   Genitourinary: Negative.   Musculoskeletal:  Positive for  back pain and joint pain.  Skin: Negative.   Neurological: Negative.   Endo/Heme/Allergies: Negative.   Psychiatric/Behavioral: Negative.      With Past History of the following :   Past Medical History:  Diagnosis Date   Anxiety    Headache(784.0)    Heart murmur    Sickle cell crisis ([place])    Syphilis 2015   Was diagnosed and received one injection of antibiotics   Thrombocytosis 11/22/2014     CBC Latest Ref Rng & Units 12/13/2018 12/11/2018 12/10/2018 WBC 4.0 - 10.5 K/uL 14.8(H) 13.2(H) 15.9(H) Hemoglobin 12.0 - 15.0 g/dL 8.2(L) 7.7(L) 8.1(L) Hematocrit 36.0 - 46.0 % 23.7(L) 23.2(L) 24.5(L) Platelets 150 - 400 K/uL 326 399 388        Past Surgical History:  Procedure Laterality Date   CESAREAN SECTION N/A 02/05/2019   Procedure: CESAREAN SECTION;  Surgeon: [name], MD;  Location: [facility] LD ORS;  Service: Obstetrics;  Laterality: N/A;   CHOLECYSTECTOMY N/A 11/30/2014   Procedure: LAPAROSCOPIC CHOLECYSTECTOMY SINGLE SITE WITH INTRAOPERATIVE CHOLANGIOGRAM;  Surgeon: Michael Boston, MD;  Location: [facility] ORS;  Service: General;  Laterality: N/A;   SPLENECTOMY       Social History:   Social History   Tobacco Use   Smoking status: Some Days    Types: Cigarettes   Smokeless tobacco: Never  Substance Use Topics   Alcohol use: No     Lives - At home   Family History :  Family History  Problem Relation Age of Onset   Hypertension Mother    Sickle cell anemia Sister    Kidney disease Sister        Lupus   Arthritis Sister    Sickle cell anemia Sister    Sickle cell trait Sister    Heart disease Maternal Aunt        CABG   Heart disease Maternal Uncle        CABG   Lupus Sister      Home Medications:   Prior to Admission medications   Medication Sig Start Date End Date Taking? Authorizing Provider  betamethasone valerate ointment (VALISONE) 0.1 % Apply 1 application topically in the morning and at bedtime. 10/10/21  Yes [provider]  citalopram (CELEXA)  20 MG tablet TAKE 1 TABLET BY MOUTH EVERY DAY Patient taking differently: Take 20 mg by mouth daily as needed (anxiety). 02/24/21  Yes [name], FNP  cyclobenzaprine (FLEXERIL) 10 MG tablet TAKE 1 TABLET BY MOUTH TWICE A DAY Patient taking differently: Take 10 mg by mouth 2 (two) times daily as needed for muscle spasms. 10/12/21  Yes Vevelyn Francois, NP  folic acid (FOLVITE) 1 MG tablet Take 1 tablet (1 mg total) by mouth daily. 03/15/19  Yes Lanae Boast, FNP  morphine (MS CONTIN) 60 MG 12 hr tablet Take 1 tablet (60 mg total) by mouth every 12 (twelve) hours. 12/04/21 01/03/22 Yes Vevelyn Francois, NP  Oxycodone HCl 10 MG TABS Take 1 tablet (10 mg total) by mouth every 6 (six) hours as needed for up to 15 days (pain). 12/04/21 12/19/21 Yes King, Diona Foley, NP  Vitamin D, Ergocalciferol, (DRISDOL) 1.25 MG (50000 UNIT) CAPS capsule TAKE ONE CAPSULE BY MOUTH ONE TIME PER WEEK Patient taking differently: Take 50,000 Units by mouth every 7 (seven) days. 09/03/20  Yes Vevelyn Francois, NP  DULoxetine (CYMBALTA) 30 MG capsule Take 2 capsules (60 mg total) by mouth daily. 10/21/21 04/19/22  Vevelyn Francois, NP     Allergies:   Allergies  Allergen Reactions   Ketamine Hives and Other (See Comments)    Makes feel funny     Physical Exam:   Vitals:   Vitals:   12/03/21 0809 12/03/21 0810  BP: 119/77 119/77  Pulse: 77 70  Resp: 18 16  Temp:    SpO2: 100% 100%    Physical Exam: Constitutional: Patient appears well-developed and well-nourished. Not in obvious distress. HENT: Normocephalic, atraumatic, External right and left ear normal. Oropharynx is clear and moist.  Eyes: Conjunctivae and EOM are normal. PERRLA, no scleral icterus. Neck: Normal ROM. Neck supple. No JVD. No tracheal deviation. No thyromegaly. CVS: RRR, S1/S2 +, no murmurs, no gallops, no carotid bruit.  Pulmonary: Effort and breath sounds normal, no stridor, rhonchi, wheezes, rales.  Abdominal: Soft. BS +, no distension,  tenderness, rebound or guarding.  Musculoskeletal: Normal range of motion. No edema and no tenderness.  Lymphadenopathy: No lymphadenopathy noted, cervical, inguinal or axillary Neuro: Alert. Normal reflexes, muscle tone coordination. No cranial nerve deficit. Skin: Skin is warm and dry. No rash noted. Not diaphoretic. No erythema. No pallor. Psychiatric: Normal mood and affect. Behavior, judgment, thought content normal.   Data Review:   CBC Recent Labs  Lab 12/02/21 1612  WBC 11.2*  HGB 10.9*  HCT 30.8*  PLT 500*  MCV 84.8  MCH 30.0  MCHC 35.4  RDW 14.1  LYMPHSABS 3.0  MONOABS 1.0  EOSABS 0.3  BASOSABS  0.0   ------------------------------------------------------------------------------------------------------------------  Chemistries  Recent Labs  Lab 12/02/21 1612  NA 136  K 3.8  CL 104  CO2 24  GLUCOSE 97  BUN 10  CREATININE 0.64  CALCIUM 9.6  AST 34  ALT 36  ALKPHOS 61  BILITOT 1.0   ------------------------------------------------------------------------------------------------------------------ estimated creatinine clearance is 108.9 mL/min (by C-G formula based on SCr of 0.64 mg/dL). ------------------------------------------------------------------------------------------------------------------ No results for input(s): TSH, T4TOTAL, T3FREE, THYROIDAB in the last 72 hours.  Invalid input(s): FREET3  Coagulation profile No results for input(s): INR, PROTIME in the last 168 hours. ------------------------------------------------------------------------------------------------------------------- No results for input(s): DDIMER in the last 72 hours. -------------------------------------------------------------------------------------------------------------------  Cardiac Enzymes No results for input(s): CKMB, TROPONINI, MYOGLOBIN in the last 168 hours.  Invalid input(s):  CK ------------------------------------------------------------------------------------------------------------------ No results found for: BNP  ---------------------------------------------------------------------------------------------------------------  Urinalysis    Component Value Date/Time   COLORURINE STRAW (A) 10/19/2021 1045   APPEARANCEUR CLEAR 10/19/2021 1045   LABSPEC 1.005 10/19/2021 1045   PHURINE 6.0 10/19/2021 1045   GLUCOSEU NEGATIVE 10/19/2021 [place] 10/19/2021 [place] 10/19/2021 1045   BILIRUBINUR negative 08/26/2021 1527   BILIRUBINUR Negative 08/10/2019 [place] 10/19/2021 1045   PROTEINUR NEGATIVE 10/19/2021 1045   UROBILINOGEN 1.0 08/26/2021 1527   UROBILINOGEN 1.0 01/12/2018 0943   NITRITE NEGATIVE 10/19/2021 [place] 10/19/2021 1045    ----------------------------------------------------------------------------------------------------------------   Imaging Results:    No results found.   Assessment & Plan:  Principal Problem:   Sickle cell pain crisis ([place]) Active Problems:   Thrombocytosis   Leukocytosis   Major depressive disorder   Chronic pain syndrome   Sickle cell disease with pain crisis: Admit, continue D5 0.45% saline at 75 mL/h.  IV Dilaudid PCA with settings of 0.6 mg, 10-minute lockout, and 4 mg/h.  Toradol 15 mg IV every 6 hours.  MS Contin 60 mg every 12 hours per home dose.  Monitor vital signs very closely, reevaluate pain scale regularly, and supplemental oxygen as needed.  Patient will be reevaluated for pain in the context of function and relationship to baseline as care progresses  Chronic pain syndrome: Continue home medications  Leukocytosis: Very mild leukocytosis, more than likely secondary to sickle cell crisis.  Monitor closely, repeat labs in AM.  Anemia of chronic disease: Hemoglobin 10.9, consistent with patient's baseline.  There is no  clinical indication for blood transfusion at this time.  Follow closely.  Labs in AM.  History of depression: Clinically stable.  Cymbalta 60 mg daily.  No suicidal or homicidal intentions on today.     DVT Prophylaxis: Subcut Lovenox and SCDs  AM Labs Ordered, also please review Full Orders  Family Communication: Admission, patient's condition and plan of care including tests being ordered have been discussed with the patient who indicate understanding and agree with the plan and Code Status.  Code Status: Full Code  Consults called: None    Admission status: Inpatient    Time spent in minutes : 50 minutes [place], MSN, FNP-C Patient [facility] [address] [place], [place] [address] [phone]  12/03/2021 at 8:57 AM

## 2021-12-04 ENCOUNTER — Other Ambulatory Visit: Payer: Self-pay | Admitting: Nurse Practitioner

## 2021-12-04 ENCOUNTER — Other Ambulatory Visit: Payer: Self-pay | Admitting: Obstetrics

## 2021-12-04 DIAGNOSIS — D5700 Hb-SS disease with crisis, unspecified: Secondary | ICD-10-CM | POA: Diagnosis not present

## 2021-12-04 NOTE — Progress Notes (Signed)
Subjective: Carmen [name] is a 31 year old female with a medical history significant for sickle cell disease, chronic pain syndrome, opiate dependence and tolerance, history of depression and history of anemia of chronic disease was admitted for sickle cell pain crisis. Patient states the pain intensity has not improved overnight.  She rates her pain as 9/10.  Pain is primarily to low back and lower extremities.  She denies any headache, chest pain, urinary symptoms, nausea, vomiting, or diarrhea.  Objective:  Vital signs in last 24 hours:  Vitals:   12/04/21 0400 12/04/21 0635 12/04/21 0758 12/04/21 1030  BP:  118/68  104/65  Pulse:  60  62  Resp: 14  16 16   Temp:  (!) 97.5 F (36.4 C)  97.6 F (36.4 C)  TempSrc:  Oral  Oral  SpO2: 99% 100% 99% 100%  Weight:      Height:        Intake/Output from previous day:   Intake/Output Summary (Last 24 hours) at 12/04/2021 1238 Last data filed at 12/04/2021 0900 Gross per 24 hour  Intake 1305.96 ml  Output --  Net 1305.96 ml    Physical Exam: General: Alert, awake, oriented x3, in no acute distress.  HEENT: [place]/AT PEERL, EOMI Neck: Trachea midline,  no masses, no thyromegal,y no JVD, no carotid bruit OROPHARYNX:  Moist, No exudate/ erythema/lesions.  Heart: Regular rate and rhythm, without murmurs, rubs, gallops, PMI non-displaced, no heaves or thrills on palpation.  Lungs: Clear to auscultation, no wheezing or rhonchi noted. No increased vocal fremitus resonant to percussion  Abdomen: Soft, nontender, nondistended, positive bowel sounds, no masses no hepatosplenomegaly noted..  Neuro: No focal neurological deficits noted cranial nerves II through XII grossly intact. DTRs 2+ bilaterally upper and lower extremities. Strength 5 out of 5 in bilateral upper and lower extremities. Musculoskeletal: No warm swelling or erythema around joints, no spinal tenderness noted. Psychiatric: Patient alert and oriented x3, good insight and cognition, good  recent to remote recall. Lymph node survey: No cervical axillary or inguinal lymphadenopathy noted.  Lab Results:  Basic Metabolic Panel:    Component Value Date/Time   NA 136 12/02/2021 1612   NA 142 07/02/2020 1040   K 3.8 12/02/2021 1612   CL 104 12/02/2021 1612   CO2 24 12/02/2021 1612   BUN 10 12/02/2021 1612   BUN 8 07/02/2020 1040   CREATININE 0.64 12/02/2021 1612   GLUCOSE 97 12/02/2021 1612   CALCIUM 9.6 12/02/2021 1612   CBC:    Component Value Date/Time   WBC 11.2 (H) 12/02/2021 1612   HGB 10.9 (L) 12/02/2021 1612   HGB 9.8 (L) 07/02/2020 1040   HCT 30.8 (L) 12/02/2021 1612   HCT 28.9 (L) 07/02/2020 1040   PLT 500 (H) 12/02/2021 1612   PLT 628 (H) 07/02/2020 1040   MCV 84.8 12/02/2021 1612   MCV 88 07/02/2020 1040   NEUTROABS 6.8 12/02/2021 1612   NEUTROABS 3.0 07/02/2020 1040   LYMPHSABS 3.0 12/02/2021 1612   LYMPHSABS 3.5 (H) 07/02/2020 1040   MONOABS 1.0 12/02/2021 1612   EOSABS 0.3 12/02/2021 1612   EOSABS 0.2 07/02/2020 1040   BASOSABS 0.0 12/02/2021 1612   BASOSABS 0.1 07/02/2020 1040    Recent Results (from the past 240 hour(s))  Resp Panel by RT-PCR (Flu A&B, Covid) Nasopharyngeal Swab     Status: None   Collection Time: 12/02/21  9:28 PM   Specimen: Nasopharyngeal Swab; Nasopharyngeal(NP) swabs in vial transport medium  Result Value Ref Range Status  SARS Coronavirus 2 by RT PCR NEGATIVE NEGATIVE Final    Comment: (NOTE) SARS-CoV-2 target nucleic acids are NOT DETECTED.  The SARS-CoV-2 RNA is generally detectable in upper respiratory specimens during the acute phase of infection. The lowest concentration of SARS-CoV-2 viral copies this assay can detect is 138 copies/mL. A negative result does not preclude SARS-Cov-2 infection and should not be used as the sole basis for treatment or other patient management decisions. A negative result may occur with  improper specimen collection/handling, submission of specimen other than nasopharyngeal  swab, presence of viral mutation(s) within the areas targeted by this assay, and inadequate number of viral copies(<138 copies/mL). A negative result must be combined with clinical observations, patient history, and epidemiological information. The expected result is Negative.  Fact Sheet for Patients:  EntrepreneurPulse.com.au  Fact Sheet for Healthcare Providers:  IncredibleEmployment.be  This test is no t yet approved or cleared by the [place] FDA and  has been authorized for detection and/or diagnosis of SARS-CoV-2 by FDA under an Emergency Use Authorization (EUA). This EUA will remain  in effect (meaning this test can be used) for the duration of the COVID-19 declaration under Section 564(b)(1) of the Act, 21 U.S.C.section 360bbb-3(b)(1), unless the authorization is terminated  or revoked sooner.       Influenza A by PCR NEGATIVE NEGATIVE Final   Influenza B by PCR NEGATIVE NEGATIVE Final    Comment: (NOTE) The Xpert Xpress SARS-CoV-2/FLU/RSV plus assay is intended as an aid in the diagnosis of influenza from Nasopharyngeal swab specimens and should not be used as a sole basis for treatment. Nasal washings and aspirates are unacceptable for Xpert Xpress SARS-CoV-2/FLU/RSV testing.  Fact Sheet for Patients: EntrepreneurPulse.com.au  Fact Sheet for Healthcare Providers: IncredibleEmployment.be  This test is not yet approved or cleared by the [place] FDA and has been authorized for detection and/or diagnosis of SARS-CoV-2 by FDA under an Emergency Use Authorization (EUA). This EUA will remain in effect (meaning this test can be used) for the duration of the COVID-19 declaration under Section 564(b)(1) of the Act, 21 U.S.C. section 360bbb-3(b)(1), unless the authorization is terminated or revoked.  Performed at [facility], [place] [address], [place], [place] [address]      Studies/Results: No results found.  Medications: Scheduled Meds:  DULoxetine  60 mg Oral Daily   enoxaparin (LOVENOX) injection  40 mg Subcutaneous R41U   folic acid  1 mg Oral Daily   HYDROmorphone   Intravenous Q4H   ketorolac  15 mg Intravenous Q6H   morphine  60 mg Oral Q12H   senna-docusate  1 tablet Oral BID   Continuous Infusions:  dextrose 5 % and 0.45% NaCl Stopped (12/03/21 1310)   PRN Meds:.cyclobenzaprine, diphenhydrAMINE, naloxone **AND** sodium chloride flush, polyethylene glycol, promethazine  Consultants: None  Procedures: None  Antibiotics: None  Assessment/Plan: Principal Problem:   Sickle cell pain crisis ([place]) Active Problems:   Thrombocytosis   Leukocytosis   Major depressive disorder   Chronic pain syndrome  Sickle cell disease with pain crisis: Decrease IV fluids to KVO Continue IV Dilaudid PCA without any changes in settings Toradol 15 mg IV every 6 hours Monitor vital signs very closely, reevaluate pain scale regularly, and supplemental oxygen as needed  Chronic pain syndrome: Continue home medications  History of depression: Clinically stable.  Continue home medications.  No suicidal or homicidal intentions on today.  Leukocytosis: On admission, very mild leukocytosis.  Patient refused labs on today.  She has  remained afebrile without any signs of infection.  We will continue to follow closely.  Anemia of chronic disease: Hemoglobin consistent with patient's baseline.  There is no clinical indication for blood transfusion.  Follow closely.   Code Status: Full Code Family Communication: N/A Disposition Plan: Not yet ready for discharge  [place], MSN, FNP-C [facility] [place] [address] [place], [place] [address] [phone]  If 7PM-7AM, please contact night-coverage.  12/04/2021, 12:38 PM  LOS: 1 day

## 2021-12-05 DIAGNOSIS — D5700 Hb-SS disease with crisis, unspecified: Secondary | ICD-10-CM | POA: Diagnosis not present

## 2021-12-05 LAB — CBC
HCT: 24 % — ABNORMAL LOW (ref 36.0–46.0)
Hemoglobin: 8.6 g/dL — ABNORMAL LOW (ref 12.0–15.0)
MCH: 29.7 pg (ref 26.0–34.0)
MCHC: 35.8 g/dL (ref 30.0–36.0)
MCV: 82.8 fL (ref 80.0–100.0)
Platelets: 413 10*3/uL — ABNORMAL HIGH (ref 150–400)
RBC: 2.9 MIL/uL — ABNORMAL LOW (ref 3.87–5.11)
RDW: 14.5 % (ref 11.5–15.5)
WBC: 12.7 10*3/uL — ABNORMAL HIGH (ref 4.0–10.5)
nRBC: 0 % (ref 0.0–0.2)

## 2021-12-05 NOTE — Plan of Care (Signed)
°  Problem: Clinical Measurements: Goal: Will remain free from infection Outcome: Not Progressing   Problem: Health Behavior/Discharge Planning: Goal: Ability to manage health-related needs will improve Outcome: Not Progressing

## 2021-12-18 ENCOUNTER — Other Ambulatory Visit: Payer: Self-pay | Admitting: Nurse Practitioner

## 2021-12-18 MED ORDER — OXYCODONE HCL 10 MG PO TABS: 10.0000 mg | ORAL_TABLET | Freq: Four times a day (QID) | ORAL | 0 refills | Status: DC | PRN

## 2021-12-21 NOTE — Discharge Summary (Signed)
Physician Discharge Summary   Patient: Carmen [name] MRN: [id number] DOB: 1991-01-01  Admit date:     12/02/2021  Discharge date: 12/05/2021  Discharge Physician: Barbette Merino   PCP: Vevelyn Francois, NP   Recommendations at discharge:  None  Discharge Diagnoses: Principal Problem:   Sickle cell pain crisis [facility]) Active Problems:   Thrombocytosis   Leukocytosis   Major depressive disorder   Chronic pain syndrome  Resolved Problems:   * No resolved hospital problems. [facility] Course: Patient was admitted with sickle cell pain crisis.  She was being treated with Dilaudid PCA Toradol and IV fluids.  Patient was scheduled to continue treatment with adjustment of her medications.  On the fourth however patient was mad at the nurses.  Did not did not wait for medical care.  She decided to leave [place].  No notes on file  Assessment and Plan: No notes have been filed under this hospital service. Service: Hospitalist          Consultants: None Procedures performed: None Disposition:  Left AGAINST MEDICAL ADVICE Diet recommendation:  Regular diet  DISCHARGE MEDICATION: Allergies as of 12/05/2021       Reactions   Ketamine Hives, Other (See Comments)   Makes feel funny        Medication List     ASK your doctor about these medications    citalopram 20 MG tablet Commonly known as: CELEXA TAKE 1 TABLET BY MOUTH EVERY DAY   cyclobenzaprine 10 MG tablet Commonly known as: FLEXERIL TAKE 1 TABLET BY MOUTH TWICE A DAY   DULoxetine 30 MG capsule Commonly known as: CYMBALTA Take 2 capsules (60 mg total) by mouth daily.   folic acid 1 MG tablet Commonly known as: FOLVITE Take 1 tablet (1 mg total) by mouth daily.   morphine 60 MG 12 hr tablet Commonly known as: MS Contin Take 1 tablet (60 mg total) by mouth every 12 (twelve) hours.   Vitamin D (Ergocalciferol) 1.25 MG (50000 UNIT) Caps capsule Commonly known as: DRISDOL TAKE ONE CAPSULE BY  MOUTH ONE TIME PER WEEK         Discharge Exam: Filed Weights   12/02/21 1554  Weight: 86.2 kg   General: Awake alert oriented no distress HEENT: PERRLA EOMI no pallor no jaundice Respiratory: Good air entry bilaterally no wheeze no rales no crackles Cardiovascular: Regular rate and rhythm  Condition at discharge: good  The results of significant diagnostics from this hospitalization (including imaging, microbiology, ancillary and laboratory) are listed below for reference.   Imaging Studies: No results found.  Microbiology: Results for orders placed or performed during the hospital encounter of 12/02/21  Resp Panel by RT-PCR (Flu A&B, Covid) Nasopharyngeal Swab     Status: None   Collection Time: 12/02/21  9:28 PM   Specimen: Nasopharyngeal Swab; Nasopharyngeal(NP) swabs in vial transport medium  Result Value Ref Range Status   SARS Coronavirus 2 by RT PCR NEGATIVE NEGATIVE Final    Comment: (NOTE) SARS-CoV-2 target nucleic acids are NOT DETECTED.  The SARS-CoV-2 RNA is generally detectable in upper respiratory specimens during the acute phase of infection. The lowest concentration of SARS-CoV-2 viral copies this assay can detect is 138 copies/mL. A negative result does not preclude SARS-Cov-2 infection and should not be used as the sole basis for treatment or other patient management decisions. A negative result may occur with  improper specimen collection/handling, submission of specimen other than nasopharyngeal swab, presence of viral mutation(s)  within the areas targeted by this assay, and inadequate number of viral copies(<138 copies/mL). A negative result must be combined with clinical observations, patient history, and epidemiological information. The expected result is Negative.  Fact Sheet for Patients:  EntrepreneurPulse.com.au  Fact Sheet for Healthcare Providers:  IncredibleEmployment.be  This test is no t yet  approved or cleared by the [place] FDA and  has been authorized for detection and/or diagnosis of SARS-CoV-2 by FDA under an Emergency Use Authorization (EUA). This EUA will remain  in effect (meaning this test can be used) for the duration of the COVID-19 declaration under Section 564(b)(1) of the Act, 21 U.S.C.section 360bbb-3(b)(1), unless the authorization is terminated  or revoked sooner.       Influenza A by PCR NEGATIVE NEGATIVE Final   Influenza B by PCR NEGATIVE NEGATIVE Final    Comment: (NOTE) The Xpert Xpress SARS-CoV-2/FLU/RSV plus assay is intended as an aid in the diagnosis of influenza from Nasopharyngeal swab specimens and should not be used as a sole basis for treatment. Nasal washings and aspirates are unacceptable for Xpert Xpress SARS-CoV-2/FLU/RSV testing.  Fact Sheet for Patients: EntrepreneurPulse.com.au  Fact Sheet for Healthcare Providers: IncredibleEmployment.be  This test is not yet approved or cleared by the [place] FDA and has been authorized for detection and/or diagnosis of SARS-CoV-2 by FDA under an Emergency Use Authorization (EUA). This EUA will remain in effect (meaning this test can be used) for the duration of the COVID-19 declaration under Section 564(b)(1) of the Act, 21 U.S.C. section 360bbb-3(b)(1), unless the authorization is terminated or revoked.  Performed at [facility], [place] [address], [place], [place] [address]     Labs: CBC: No results for input(s): WBC, NEUTROABS, HGB, HCT, MCV, PLT in the last 168 hours. Basic Metabolic Panel: No results for input(s): NA, K, CL, CO2, GLUCOSE, BUN, CREATININE, CALCIUM, MG, PHOS in the last 168 hours. Liver Function Tests: No results for input(s): AST, ALT, ALKPHOS, BILITOT, PROT, ALBUMIN in the last 168 hours. CBG: No results for input(s): GLUCAP in the last 168 hours.  Discharge time spent: less than 30  minutes.  SignedBarbette Merino, MD [facility] 12/21/2021

## 2021-12-22 ENCOUNTER — Other Ambulatory Visit: Payer: Self-pay

## 2021-12-22 NOTE — Telephone Encounter (Signed)
Pt called in and stated she wanted a refill on the cream she used for her skin!

## 2021-12-23 MED ORDER — BETAMETHASONE VALERATE 0.1 % EX OINT: TOPICAL_OINTMENT | CUTANEOUS | 1 refills | Status: DC

## 2021-12-28 ENCOUNTER — Other Ambulatory Visit: Payer: Self-pay | Admitting: Nurse Practitioner

## 2021-12-28 DIAGNOSIS — Z79891 Long term (current) use of opiate analgesic: Secondary | ICD-10-CM

## 2021-12-28 DIAGNOSIS — D571 Sickle-cell disease without crisis: Secondary | ICD-10-CM

## 2021-12-28 MED ORDER — MORPHINE SULFATE ER 60 MG PO TBCR: 60.0000 mg | EXTENDED_RELEASE_TABLET | Freq: Two times a day (BID) | ORAL | 0 refills | Status: DC

## 2021-12-28 MED ORDER — OXYCODONE HCL 10 MG PO TABS: 10.0000 mg | ORAL_TABLET | Freq: Four times a day (QID) | ORAL | 0 refills | Status: DC | PRN

## 2021-12-31 ENCOUNTER — Other Ambulatory Visit: Payer: Self-pay

## 2021-12-31 ENCOUNTER — Encounter (HOSPITAL_COMMUNITY): Payer: Self-pay

## 2021-12-31 ENCOUNTER — Inpatient Hospital Stay (HOSPITAL_COMMUNITY)
Admission: EM | Admit: 2021-12-31 | Discharge: 2022-01-04 | DRG: 812 | Disposition: A | Discharge status: Home / Self Care | Payer: Medicaid Other | Attending: Internal Medicine | Admitting: Internal Medicine

## 2021-12-31 DIAGNOSIS — Z841 Family history of disorders of kidney and ureter: Secondary | ICD-10-CM

## 2021-12-31 DIAGNOSIS — Z79899 Other long term (current) drug therapy: Secondary | ICD-10-CM

## 2021-12-31 DIAGNOSIS — R011 Cardiac murmur, unspecified: Secondary | ICD-10-CM | POA: Diagnosis present

## 2021-12-31 DIAGNOSIS — F1120 Opioid dependence, uncomplicated: Secondary | ICD-10-CM | POA: Diagnosis present

## 2021-12-31 DIAGNOSIS — Z832 Family history of diseases of the blood and blood-forming organs and certain disorders involving the immune mechanism: Secondary | ICD-10-CM

## 2021-12-31 DIAGNOSIS — Z888 Allergy status to other drugs, medicaments and biological substances status: Secondary | ICD-10-CM

## 2021-12-31 DIAGNOSIS — Z8249 Family history of ischemic heart disease and other diseases of the circulatory system: Secondary | ICD-10-CM

## 2021-12-31 DIAGNOSIS — D5700 Hb-SS disease with crisis, unspecified: Principal | ICD-10-CM | POA: Diagnosis present

## 2021-12-31 DIAGNOSIS — F329 Major depressive disorder, single episode, unspecified: Secondary | ICD-10-CM | POA: Diagnosis present

## 2021-12-31 DIAGNOSIS — Z8261 Family history of arthritis: Secondary | ICD-10-CM

## 2021-12-31 DIAGNOSIS — G894 Chronic pain syndrome: Secondary | ICD-10-CM | POA: Diagnosis present

## 2021-12-31 DIAGNOSIS — D638 Anemia in other chronic diseases classified elsewhere: Secondary | ICD-10-CM | POA: Diagnosis present

## 2021-12-31 DIAGNOSIS — F32A Depression, unspecified: Secondary | ICD-10-CM | POA: Diagnosis present

## 2021-12-31 DIAGNOSIS — Z9081 Acquired absence of spleen: Secondary | ICD-10-CM

## 2021-12-31 DIAGNOSIS — Z20822 Contact with and (suspected) exposure to covid-19: Secondary | ICD-10-CM | POA: Diagnosis present

## 2021-12-31 DIAGNOSIS — F419 Anxiety disorder, unspecified: Secondary | ICD-10-CM | POA: Diagnosis present

## 2021-12-31 LAB — CBC WITH DIFFERENTIAL/PLATELET
Abs Immature Granulocytes: 0.02 10*3/uL (ref 0.00–0.07)
Basophils Absolute: 0 10*3/uL (ref 0.0–0.1)
Basophils Relative: 1 %
Eosinophils Absolute: 0.3 10*3/uL (ref 0.0–0.5)
Eosinophils Relative: 3 %
HCT: 30.9 % — ABNORMAL LOW (ref 36.0–46.0)
Hemoglobin: 10.8 g/dL — ABNORMAL LOW (ref 12.0–15.0)
Immature Granulocytes: 0 %
Lymphocytes Relative: 42 %
Lymphs Abs: 3.2 10*3/uL (ref 0.7–4.0)
MCH: 29.8 pg (ref 26.0–34.0)
MCHC: 35 g/dL (ref 30.0–36.0)
MCV: 85.4 fL (ref 80.0–100.0)
Monocytes Absolute: 0.9 10*3/uL (ref 0.1–1.0)
Monocytes Relative: 12 %
Neutro Abs: 3.2 10*3/uL (ref 1.7–7.7)
Neutrophils Relative %: 42 %
Platelets: 519 10*3/uL — ABNORMAL HIGH (ref 150–400)
RBC: 3.62 MIL/uL — ABNORMAL LOW (ref 3.87–5.11)
RDW: 13.3 % (ref 11.5–15.5)
WBC: 7.7 10*3/uL (ref 4.0–10.5)
nRBC: 0.3 % — ABNORMAL HIGH (ref 0.0–0.2)

## 2021-12-31 LAB — BASIC METABOLIC PANEL
Anion gap: 6 (ref 5–15)
BUN: 10 mg/dL (ref 6–20)
CO2: 24 mmol/L (ref 22–32)
Calcium: 9.4 mg/dL (ref 8.9–10.3)
Chloride: 108 mmol/L (ref 98–111)
Creatinine, Ser: 0.6 mg/dL (ref 0.44–1.00)
GFR, Estimated: >60 mL/min (ref >60)
Glucose, Bld: 103 mg/dL — ABNORMAL HIGH (ref 70–99)
Potassium: 3.9 mmol/L (ref 3.5–5.1)
Sodium: 138 mmol/L (ref 135–145)

## 2021-12-31 LAB — RESP PANEL BY RT-PCR (FLU A&B, COVID) ARPGX2
Influenza A by PCR: NEGATIVE
Influenza B by PCR: NEGATIVE
SARS Coronavirus 2 by RT PCR: NEGATIVE

## 2021-12-31 LAB — HCG, QUANTITATIVE, PREGNANCY: hCG, Beta Chain, Quant, S: 1 m[IU]/mL (ref <5)

## 2021-12-31 LAB — RETICULOCYTES
Immature Retic Fract: 36.6 % — ABNORMAL HIGH (ref 2.3–15.9)
RBC.: 3.58 MIL/uL — ABNORMAL LOW (ref 3.87–5.11)
Retic Count, Absolute: 149.3 10*3/uL (ref 19.0–186.0)
Retic Ct Pct: 4.2 % — ABNORMAL HIGH (ref 0.4–3.1)

## 2021-12-31 MED ORDER — SODIUM CHLORIDE 0.45 % IV SOLN: INTRAVENOUS | Status: DC

## 2021-12-31 MED ORDER — ONDANSETRON HCL 4 MG/2ML IJ SOLN
4.0000 mg | Freq: Four times a day (QID) | INTRAMUSCULAR | Status: DC | PRN
  Administered 2022-01-01: 4 mg via INTRAVENOUS
  Filled 2021-12-31: qty 2

## 2021-12-31 MED ORDER — FOLIC ACID 1 MG PO TABS
1.0000 mg | ORAL_TABLET | Freq: Every day | ORAL | Status: DC
  Administered 2021-12-31 – 2022-01-04 (×5): 1 mg via ORAL
  Filled 2021-12-31 (×5): qty 1

## 2021-12-31 MED ORDER — MORPHINE SULFATE ER 15 MG PO TBCR
60.0000 mg | EXTENDED_RELEASE_TABLET | Freq: Two times a day (BID) | ORAL | Status: DC
  Administered 2021-12-31 – 2022-01-04 (×8): 60 mg via ORAL
  Filled 2021-12-31 (×8): qty 4

## 2021-12-31 MED ORDER — ENOXAPARIN SODIUM 40 MG/0.4ML IJ SOSY
40.0000 mg | PREFILLED_SYRINGE | INTRAMUSCULAR | Status: DC
  Filled 2021-12-31 (×2): qty 0.4

## 2021-12-31 MED ORDER — KETOROLAC TROMETHAMINE 15 MG/ML IJ SOLN
15.0000 mg | Freq: Four times a day (QID) | INTRAMUSCULAR | Status: DC
  Administered 2021-12-31 – 2022-01-04 (×16): 15 mg via INTRAVENOUS
  Filled 2021-12-31 (×17): qty 1

## 2021-12-31 MED ORDER — HYDROMORPHONE HCL 2 MG/ML IJ SOLN
2.0000 mg | INTRAMUSCULAR | Status: AC
  Administered 2021-12-31: 2 mg via INTRAVENOUS
  Filled 2021-12-31: qty 1

## 2021-12-31 MED ORDER — CITALOPRAM HYDROBROMIDE 20 MG PO TABS: 20.0000 mg | ORAL_TABLET | Freq: Every day | ORAL | Status: DC

## 2021-12-31 MED ORDER — DIPHENHYDRAMINE HCL 25 MG PO CAPS
25.0000 mg | ORAL_CAPSULE | ORAL | Status: DC | PRN
  Administered 2022-01-01 – 2022-01-03 (×3): 25 mg via ORAL
  Filled 2021-12-31 (×3): qty 1

## 2021-12-31 MED ORDER — HYDROMORPHONE 1 MG/ML IV SOLN
INTRAVENOUS | Status: DC
  Filled 2021-12-31: qty 30

## 2021-12-31 MED ORDER — KETOROLAC TROMETHAMINE 15 MG/ML IJ SOLN
15.0000 mg | INTRAMUSCULAR | Status: AC
  Administered 2021-12-31: 15 mg via INTRAVENOUS
  Filled 2021-12-31: qty 1

## 2021-12-31 MED ORDER — POLYETHYLENE GLYCOL 3350 17 G PO PACK
17.0000 g | PACK | Freq: Every day | ORAL | Status: DC | PRN
  Administered 2022-01-03: 17 g via ORAL
  Filled 2021-12-31: qty 1

## 2021-12-31 MED ORDER — SENNOSIDES-DOCUSATE SODIUM 8.6-50 MG PO TABS
1.0000 | ORAL_TABLET | Freq: Two times a day (BID) | ORAL | Status: DC
  Administered 2022-01-01 – 2022-01-04 (×7): 1 via ORAL
  Filled 2021-12-31 (×8): qty 1

## 2021-12-31 MED ORDER — SODIUM CHLORIDE 0.9 % IV SOLN
12.5000 mg | Freq: Once | INTRAVENOUS | Status: DC
  Filled 2021-12-31: qty 0.25

## 2021-12-31 MED ORDER — SODIUM CHLORIDE 0.9% FLUSH: 9.0000 mL | INTRAVENOUS | Status: DC | PRN

## 2021-12-31 MED ORDER — HYDROMORPHONE 1 MG/ML IV SOLN
INTRAVENOUS | Status: DC
  Administered 2022-01-01: 9.6 mg via INTRAVENOUS
  Administered 2022-01-01: 9 mg via INTRAVENOUS
  Administered 2022-01-01: 8.4 mg via INTRAVENOUS
  Filled 2021-12-31 (×2): qty 30

## 2021-12-31 MED ORDER — DULOXETINE HCL 60 MG PO CPEP
60.0000 mg | ORAL_CAPSULE | Freq: Every day | ORAL | Status: DC
  Administered 2021-12-31 – 2022-01-04 (×5): 60 mg via ORAL
  Filled 2021-12-31 (×5): qty 1

## 2021-12-31 MED ORDER — HYDROMORPHONE HCL 2 MG/ML IJ SOLN: 2.0000 mg | INTRAMUSCULAR | Status: DC | PRN

## 2021-12-31 MED ORDER — NALOXONE HCL 0.4 MG/ML IJ SOLN: 0.4000 mg | INTRAMUSCULAR | Status: DC | PRN

## 2021-12-31 NOTE — ED Notes (Signed)
EDP at the bedside to re-eval.  ?

## 2021-12-31 NOTE — ED Notes (Signed)
Pt requesting pain medicine. RN aware  ?

## 2021-12-31 NOTE — ED Notes (Signed)
EDP at the bedside.  ?

## 2021-12-31 NOTE — H&P (Signed)
H&P  Patient Demographics:  Carmen [name], is a 31 y.o. female  MRN: [id number]   DOB - 11-19-1990  Admit Date - 12/31/2021  Outpatient Primary MD for the patient is Vevelyn Francois, NP  Chief Complaint  Patient presents with   Sickle Cell Pain Crisis      HPI:   Carmen [name]  is a 31 y.o. female with a medical history significant for sickle cell disease, chronic pain syndrome, opiate dependence and tolerance, history of major depressive disorder, and history of anemia of chronic disease presents to the emergency room with complaints of low back and lower extremity pain that is consistent with her typical sickle cell crisis.  Patient states that pain intensity increased while at work 2 days prior, she states that pain suddenly started shooting down her back to lower extremities.  She currently rates her pain at 9/10.  She has been taking her home MS Contin and oxycodone without any sustained relief.  Patient attributes current pain crisis to changes in weather.  She denies any fever, chills, chest pain, or shortness of breath.  No nausea, vomiting, diarrhea, or urinary symptoms.  No sick contacts, recent travel, or known exposure to COVID-19.  ER course: While in the ER, hemoglobin 10.8 g/dL, consistent with patient's baseline.  WBCs 7.7 and platelets 519,000.  All other laboratory values unremarkable.  Vital signs recorded as:BP 117/74 (BP Location: Left Arm)    Pulse 70    Temp 98.2 F (36.8 C) (Oral)    Resp 16    Ht 5\' 3"  (1.6 m)    Wt 86.2 kg    SpO2 100%    BMI 33.66 kg/m  Patient's pain persists despite IV Dilaudid and IV fluids.  She will be admitted to [facility] and observation for management of sickle cell pain crisis.   Review of systems:  In addition to the HPI above, patient reports No fever or chills No Headache, No changes with vision or hearing No problems swallowing food or liquids No chest pain, cough or shortness of breath No abdominal pain, No nausea or vomiting, Bowel  movements are regular No blood in stool or urine No dysuria No new skin rashes or bruises No new joints pains-aches No new weakness, tingling, numbness in any extremity No recent weight gain or loss No polyuria, polydypsia or polyphagia No significant Mental Stressors  With Past History of the following :   Past Medical History:  Diagnosis Date   Anxiety    Headache(784.0)    Heart murmur    Sickle cell crisis ([place])    Syphilis 2015   Was diagnosed and received one injection of antibiotics   Thrombocytosis 11/22/2014     CBC Latest Ref Rng & Units 12/13/2018 12/11/2018 12/10/2018 WBC 4.0 - 10.5 K/uL 14.8(H) 13.2(H) 15.9(H) Hemoglobin 12.0 - 15.0 g/dL 8.2(L) 7.7(L) 8.1(L) Hematocrit 36.0 - 46.0 % 23.7(L) 23.2(L) 24.5(L) Platelets 150 - 400 K/uL 326 399 388        Past Surgical History:  Procedure Laterality Date   CESAREAN SECTION N/A 02/05/2019   Procedure: CESAREAN SECTION;  Surgeon: [name], MD;  Location: [facility] LD ORS;  Service: Obstetrics;  Laterality: N/A;   CHOLECYSTECTOMY N/A 11/30/2014   Procedure: LAPAROSCOPIC CHOLECYSTECTOMY SINGLE SITE WITH INTRAOPERATIVE CHOLANGIOGRAM;  Surgeon: Michael Boston, MD;  Location: [facility] ORS;  Service: General;  Laterality: N/A;   SPLENECTOMY       Social History:   Social History   Tobacco Use   Smoking status:  Some Days    Types: Cigarettes   Smokeless tobacco: Never  Substance Use Topics   Alcohol use: No     Lives - At home   Family History :   Family History  Problem Relation Age of Onset   Hypertension Mother    Sickle cell anemia Sister    Kidney disease Sister        Lupus   Arthritis Sister    Sickle cell anemia Sister    Sickle cell trait Sister    Heart disease Maternal Aunt        CABG   Heart disease Maternal Uncle        CABG   Lupus Sister      Home Medications:   Prior to Admission medications   Medication Sig Start Date End Date Taking? Authorizing Provider  betamethasone valerate ointment  (VALISONE) 0.1 % APPLY TO AFFECTED AREA TWICE A DAY Strength: 0.1 % Patient taking differently: 1 application daily. 12/23/21  Yes Vevelyn Francois, NP  DULoxetine (CYMBALTA) 30 MG capsule Take 2 capsules (60 mg total) by mouth daily. Patient taking differently: Take 60 mg by mouth daily as needed (anxiety). 10/21/21 04/19/22 Yes King, Diona Foley, NP  folic acid (FOLVITE) 1 MG tablet Take 1 tablet (1 mg total) by mouth daily. 03/15/19  Yes Lanae Boast, FNP  morphine (MS CONTIN) 60 MG 12 hr tablet Take 1 tablet (60 mg total) by mouth every 12 (twelve) hours. 01/03/22 02/02/22 Yes Vevelyn Francois, NP  Oxycodone HCl 10 MG TABS Take 1 tablet (10 mg total) by mouth every 6 (six) hours as needed for up to 15 days (pain). 01/03/22 01/18/22 Yes King, Diona Foley, NP  Vitamin D, Ergocalciferol, (DRISDOL) 1.25 MG (50000 UNIT) CAPS capsule TAKE ONE CAPSULE BY MOUTH ONE TIME PER WEEK Patient taking differently: Take 50,000 Units by mouth every 7 (seven) days. Wednesdays 09/03/20  Yes Vevelyn Francois, NP  citalopram (CELEXA) 20 MG tablet TAKE 1 TABLET BY MOUTH EVERY DAY Patient not taking: Reported on 12/31/2021 02/24/21   [name], FNP  cyclobenzaprine (FLEXERIL) 10 MG tablet TAKE 1 TABLET BY MOUTH TWICE A DAY Patient not taking: Reported on 12/31/2021 10/12/21   Vevelyn Francois, NP     Allergies:   Allergies  Allergen Reactions   Ketamine Hives and Other (See Comments)    Makes feel funny     Physical Exam:   Vitals:   Vitals:   12/31/21 1527 12/31/21 1601  BP:  117/74  Pulse:  70  Resp: 19 16  Temp:  98.2 F (36.8 C)  SpO2: 99% 100%    Physical Exam: Constitutional: Patient appears well-developed and well-nourished. Not in obvious distress. HENT: Normocephalic, atraumatic, External right and left ear normal. Oropharynx is clear and moist.  Eyes: Conjunctivae and EOM are normal. PERRLA, no scleral icterus. Neck: Normal ROM. Neck supple. No JVD. No tracheal deviation. No thyromegaly. CVS: RRR,  S1/S2 +, no murmurs, no gallops, no carotid bruit.  Pulmonary: Effort and breath sounds normal, no stridor, rhonchi, wheezes, rales.  Abdominal: Soft. BS +, no distension, tenderness, rebound or guarding.  Musculoskeletal: Normal range of motion. No edema and no tenderness.  Lymphadenopathy: No lymphadenopathy noted, cervical, inguinal or axillary Neuro: Alert. Normal reflexes, muscle tone coordination. No cranial nerve deficit. Skin: Skin is warm and dry. No rash noted. Not diaphoretic. No erythema. No pallor. Psychiatric: Normal mood and affect. Behavior, judgment, thought content normal.   Data Review:  CBC Recent Labs  Lab 12/31/21 0816  WBC 7.7  HGB 10.8*  HCT 30.9*  PLT 519*  MCV 85.4  MCH 29.8  MCHC 35.0  RDW 13.3  LYMPHSABS 3.2  MONOABS 0.9  EOSABS 0.3  BASOSABS 0.0   ------------------------------------------------------------------------------------------------------------------  Chemistries  Recent Labs  Lab 12/31/21 0816  NA 138  K 3.9  CL 108  CO2 24  GLUCOSE 103*  BUN 10  CREATININE 0.60  CALCIUM 9.4   ------------------------------------------------------------------------------------------------------------------ estimated creatinine clearance is 108.9 mL/min (by C-G formula based on SCr of 0.6 mg/dL). ------------------------------------------------------------------------------------------------------------------ No results for input(s): TSH, T4TOTAL, T3FREE, THYROIDAB in the last 72 hours.  Invalid input(s): FREET3  Coagulation profile No results for input(s): INR, PROTIME in the last 168 hours. ------------------------------------------------------------------------------------------------------------------- No results for input(s): DDIMER in the last 72 hours. -------------------------------------------------------------------------------------------------------------------  Cardiac Enzymes No results for input(s): CKMB, TROPONINI,  MYOGLOBIN in the last 168 hours.  Invalid input(s): CK ------------------------------------------------------------------------------------------------------------------ No results found for: BNP  ---------------------------------------------------------------------------------------------------------------  Urinalysis    Component Value Date/Time   COLORURINE STRAW (A) 10/19/2021 1045   APPEARANCEUR CLEAR 10/19/2021 1045   LABSPEC 1.005 10/19/2021 1045   PHURINE 6.0 10/19/2021 1045   GLUCOSEU NEGATIVE 10/19/2021 [place] 10/19/2021 [place] 10/19/2021 1045   BILIRUBINUR negative 08/26/2021 1527   BILIRUBINUR Negative 08/10/2019 [place] 10/19/2021 1045   PROTEINUR NEGATIVE 10/19/2021 1045   UROBILINOGEN 1.0 08/26/2021 1527   UROBILINOGEN 1.0 01/12/2018 0943   NITRITE NEGATIVE 10/19/2021 [place] 10/19/2021 1045    ----------------------------------------------------------------------------------------------------------------   Imaging Results:    No results found.   Assessment & Plan:  Principal Problem:   Sickle cell pain crisis (HCC) Active Problems:   Anemia of chronic disease   Major depressive disorder   Chronic pain syndrome  Sickle cell disease with pain crisis: Admit to MedSurg.  IV fluids, 0.45% saline at 75 mL/h. IV Dilaudid PCA with settings of 0.6 mg, 10-minute lockout, and 4 mg/h. MS Contin 60 mg every 12 hours per home dose Toradol 15 mg IV every 6 hours Monitor vital signs closely, reevaluate pain scale regularly, and supplemental oxygen as needed. Patient will be reevaluated for pain in the context of function and relationship to baseline as her care progresses  Anemia of chronic disease: Patient's hemoglobin is stable and consistent with her baseline.  There is no clinical indication for blood transfusion at this time.  Monitor closely.  Labs in AM.  History of major  depression: Continue Cymbalta.  Patient has no suicidal homicidal intentions on today.  Monitor closely.  Chronic pain syndrome: Continue home medications.    DVT Prophylaxis: Subcut Lovenox and SCDs  AM Labs Ordered, also please review Full Orders  Family Communication: Admission, patient's condition and plan of care including tests being ordered have been discussed with the patient who indicate understanding and agree with the plan and Code Status.  Code Status: Full Code  Consults called: None    Admission status: Inpatient    Time spent in minutes : 30 minutes [place], MSN, FNP-C [facility] [address] [place], [place] [address] [phone]  12/31/2021 at 4:13 PM

## 2021-12-31 NOTE — ED Provider Notes (Signed)
?Carmen [name] ?Provider Note ? ? ?CSN: [id number] ?Arrival date & time: 12/31/21  2130 ? ?  ? ?History ? ?Chief Complaint  ?Patient presents with  ? Sickle Cell Pain Crisis  ? ? ?Carmen [name] is a 31 y.o. female. ? ?31 year old female with prior medical history as detailed below presents for evaluation.  Patient with known history of sickle cell.  Patient reports pain to the back, and lower extremities consistent with typical painful crisis.  She reports increased pain about 24 hours.  Use of home medications has not controlled her pain.  She denies fever. ? ?The history is provided by the patient and medical records.  ?Sickle Cell Pain Crisis ?Location:  Lower extremity and back ?Severity:  Moderate ?Onset quality:  Gradual ?Duration:  1 day ?Similar to previous crisis episodes: yes   ? ?  ? ?Home Medications ?Prior to Admission medications   ?Medication Sig Start Date End Date Taking? Authorizing Provider  ?betamethasone valerate ointment (VALISONE) 0.1 % APPLY TO AFFECTED AREA TWICE A DAY ?Strength: 0.1 % 12/23/21   Vevelyn Francois, NP  ?citalopram (CELEXA) 20 MG tablet TAKE 1 TABLET BY MOUTH EVERY DAY ?Patient taking differently: Take 20 mg by mouth daily as needed (anxiety). 02/24/21   [name], FNP  ?cyclobenzaprine (FLEXERIL) 10 MG tablet TAKE 1 TABLET BY MOUTH TWICE A DAY ?Patient taking differently: Take 10 mg by mouth 2 (two) times daily as needed for muscle spasms. 10/12/21   Vevelyn Francois, NP  ?DULoxetine (CYMBALTA) 30 MG capsule Take 2 capsules (60 mg total) by mouth daily. 10/21/21 04/19/22  Vevelyn Francois, NP  ?folic acid (FOLVITE) 1 MG tablet Take 1 tablet (1 mg total) by mouth daily. 03/15/19   Lanae Boast, [place]  ?morphine (MS CONTIN) 60 MG 12 hr tablet Take 1 tablet (60 mg total) by mouth every 12 (twelve) hours. 01/03/22 02/02/22  Vevelyn Francois, NP  ?Oxycodone HCl 10 MG TABS Take 1 tablet (10 mg total) by mouth every 6 (six) hours as needed for up to 15  days (pain). 01/03/22 01/18/22  Vevelyn Francois, NP  ?Prenatal 27-1 MG TABS TAKE 1 TABLET BY MOUTH EVERY DAY 12/07/21   Shelly Bombard, MD  ?Vitamin D, Ergocalciferol, (DRISDOL) 1.25 MG (50000 UNIT) CAPS capsule TAKE ONE CAPSULE BY MOUTH ONE TIME PER WEEK ?Patient taking differently: Take 50,000 Units by mouth every 7 (seven) days. 09/03/20   Vevelyn Francois, NP  ?   ? ?Allergies    ?Ketamine   ? ?Review of Systems   ?Review of Systems  ?All other systems reviewed and are negative. ? ?Physical Exam ?Updated Vital Signs ?BP 112/67   Pulse 72   Temp 98.8 ?F (37.1 ?C) (Oral)   Resp 15   Ht 5\' 3"  (1.6 m)   Wt 86.2 kg   SpO2 100%   BMI 33.66 kg/m?  ?Physical Exam ?Vitals and nursing note reviewed.  ?Constitutional:   ?   General: She is not in acute distress. ?   Appearance: Normal appearance. She is well-developed.  ?HENT:  ?   Head: Normocephalic and atraumatic.  ?Eyes:  ?   Conjunctiva/sclera: Conjunctivae normal.  ?   Pupils: Pupils are equal, round, and reactive to light.  ?Cardiovascular:  ?   Rate and Rhythm: Normal rate and regular rhythm.  ?   Heart sounds: Normal heart sounds.  ?Pulmonary:  ?   Effort: Pulmonary effort is normal. No respiratory distress.  ?  Breath sounds: Normal breath sounds.  ?Abdominal:  ?   General: There is no distension.  ?   Palpations: Abdomen is soft.  ?   Tenderness: There is no abdominal tenderness.  ?Musculoskeletal:     ?   General: No deformity. Normal range of motion.  ?   Cervical back: Normal range of motion and neck supple.  ?Skin: ?   General: Skin is warm and dry.  ?Neurological:  ?   General: No focal deficit present.  ?   Mental Status: She is alert and oriented to person, place, and time.  ? ? ?ED Results / Procedures / Treatments   ?Labs ?(all labs ordered are listed, but only abnormal results are displayed) ?Labs Reviewed  ?CBC WITH DIFFERENTIAL/PLATELET - Abnormal; Notable for the following components:  ?    Result Value  ? RBC 3.62 (*)   ? Hemoglobin 10.8 (*)    ? HCT 30.9 (*)   ? Platelets 519 (*)   ? nRBC 0.3 (*)   ? All other components within normal limits  ?RETICULOCYTES - Abnormal; Notable for the following components:  ? Retic Ct Pct 4.2 (*)   ? RBC. 3.58 (*)   ? Immature Retic Fract 36.6 (*)   ? All other components within normal limits  ?BASIC METABOLIC PANEL - Abnormal; Notable for the following components:  ? Glucose, Bld 103 (*)   ? All other components within normal limits  ?HCG, QUANTITATIVE, PREGNANCY  ? ? ?EKG ?EKG Interpretation ? ?Date/Time:  Thursday December 31 2021 07:46:25 EST ?Ventricular Rate:  102 ?PR Interval:  153 ?QRS Duration: 91 ?QT Interval:  328 ?QTC Calculation: 428 ?R Axis:   40 ?Text Interpretation: Sinus tachycardia Confirmed by Dene Gentry [phone]) on 12/31/2021 7:56:49 AM ? ?Radiology ?No results found. ? ?Procedures ?Procedures  ? ? ?Medications Ordered in ED ?Medications  ?0.45 % sodium chloride infusion ( Intravenous New Bag/Given 12/31/21 0913)  ?diphenhydrAMINE (BENADRYL) 12.5 mg in sodium chloride 0.9 % 50 mL IVPB (0 mg Intravenous Hold 12/31/21 0912)  ?ketorolac (TORADOL) 15 MG/ML injection 15 mg (15 mg Intravenous Given 12/31/21 0913)  ?HYDROmorphone (DILAUDID) injection 2 mg (2 mg Intravenous Given 12/31/21 0912)  ?HYDROmorphone (DILAUDID) injection 2 mg (2 mg Intravenous Given 12/31/21 1034)  ?HYDROmorphone (DILAUDID) injection 2 mg (2 mg Intravenous Given 12/31/21 1129)  ? ? ?ED Course/ Medical Decision Making/ A&P ?  ?                        ?Medical Decision Making ?Amount and/or Complexity of Data Reviewed ?Labs: ordered. ? ?Risk ?Prescription drug management. ? ? ? ?Medical Screen Complete ? ?This patient presented to the ED with complaint of sickle cell painful crisis. ? ?This complaint involves an extensive number of treatment options. The initial differential diagnosis includes, but is not limited to, sickle cell painful crisis, infection, metabolic abnormality, etc. ? ?This presentation is: Acute, Chronic, Self-Limited, Previously  Undiagnosed, Uncertain Prognosis, Complicated, Systemic Symptoms, and Threat to Life/Bodily Function ? ?Patient with longstanding history of sickle cell.  Patient presents with complaint of pain consistent with her typical sickle cell crisis. ? ?Patient's screening labs obtained ? ?Patient with minimal improvement in reported pain despite 3 doses of Dilaudid. ? ?Patient is requesting admission for pain control ? ?Sickle cell hospitalist medicine service is aware of case and will evaluate for admission. ? ? ?Co morbidities that complicated the patient's evaluation ? ?Sickle cell ? ? ?Additional history obtained: ? ?  External records from outside sources obtained and reviewed including prior ED visits and prior Inpatient records.  ? ? ?Lab Tests: ? ?I ordered and personally interpreted labs.  The pertinent results include: CBC, retake, BMP, HCG ? ?Cardiac Monitoring: ? ?The patient was maintained on a cardiac monitor.  I personally viewed and interpreted the cardiac monitor which showed an underlying rhythm of: NSR ? ? ?Medicines ordered: ? ?I ordered medication including IV fluids, Toradol, Dilaudid for pain crisis ?Reevaluation of the patient after these medicines showed that the patient: improved ? ? ?Problem List / ED Course: ? ?Sickle cell painful crisis ? ? ?Reevaluation: ? ?After the interventions noted above, I reevaluated the patient and found that they have: stayed the same ?Disposition: ? ?After consideration of the diagnostic results and the patients response to treatment, I feel that the patent would benefit from admission for pain control.  ? ? ? ? ? ? ? ? ?Final Clinical Impression(s) / ED Diagnoses ?Final diagnoses:  ?Sickle cell pain crisis ([place])  ? ? ?Rx / DC Orders ?ED Discharge Orders   ? ? None  ? ?  ? ? ?  ?Valarie Merino, MD ?12/31/21 1310 ? ?

## 2021-12-31 NOTE — ED Triage Notes (Signed)
Pt presents to the ED for worsening sickle cell pain x3 days. She denies any fever or chills at home and states her current pain is similar to previous sickle cell pain in the past. She states her home medications include Oxycodone and Morphine which she began taking for her sx yesterday without relief. She states she last took Oxycodone around 2am this morning. She states she has tried unsuccessfully this week to get into the sickle cell clinic for her crisis.  ?

## 2022-01-01 DIAGNOSIS — G894 Chronic pain syndrome: Secondary | ICD-10-CM | POA: Diagnosis present

## 2022-01-01 DIAGNOSIS — Z832 Family history of diseases of the blood and blood-forming organs and certain disorders involving the immune mechanism: Secondary | ICD-10-CM | POA: Diagnosis not present

## 2022-01-01 DIAGNOSIS — Z888 Allergy status to other drugs, medicaments and biological substances status: Secondary | ICD-10-CM | POA: Diagnosis not present

## 2022-01-01 DIAGNOSIS — R011 Cardiac murmur, unspecified: Secondary | ICD-10-CM | POA: Diagnosis present

## 2022-01-01 DIAGNOSIS — D5700 Hb-SS disease with crisis, unspecified: Secondary | ICD-10-CM | POA: Diagnosis present

## 2022-01-01 DIAGNOSIS — Z8261 Family history of arthritis: Secondary | ICD-10-CM | POA: Diagnosis not present

## 2022-01-01 DIAGNOSIS — F32A Depression, unspecified: Secondary | ICD-10-CM | POA: Diagnosis present

## 2022-01-01 DIAGNOSIS — Z8249 Family history of ischemic heart disease and other diseases of the circulatory system: Secondary | ICD-10-CM | POA: Diagnosis not present

## 2022-01-01 DIAGNOSIS — Z20822 Contact with and (suspected) exposure to covid-19: Secondary | ICD-10-CM | POA: Diagnosis present

## 2022-01-01 DIAGNOSIS — Z841 Family history of disorders of kidney and ureter: Secondary | ICD-10-CM | POA: Diagnosis not present

## 2022-01-01 DIAGNOSIS — D638 Anemia in other chronic diseases classified elsewhere: Secondary | ICD-10-CM | POA: Diagnosis present

## 2022-01-01 DIAGNOSIS — Z9081 Acquired absence of spleen: Secondary | ICD-10-CM | POA: Diagnosis not present

## 2022-01-01 DIAGNOSIS — F419 Anxiety disorder, unspecified: Secondary | ICD-10-CM | POA: Diagnosis present

## 2022-01-01 DIAGNOSIS — Z79899 Other long term (current) drug therapy: Secondary | ICD-10-CM | POA: Diagnosis not present

## 2022-01-01 DIAGNOSIS — F1120 Opioid dependence, uncomplicated: Secondary | ICD-10-CM | POA: Diagnosis present

## 2022-01-01 DIAGNOSIS — F329 Major depressive disorder, single episode, unspecified: Secondary | ICD-10-CM | POA: Diagnosis present

## 2022-01-01 NOTE — TOC Initial Note (Signed)
Transition of Care (TOC) - Initial/Assessment Note  ? ? ?Patient Details  ?Name: Carmen [name] ?MRN: [id number] ?Date of Birth: [birth year] ? ?Transition of Care (TOC) CM/SW Contact:    ?Tawanna Cooler, RN ?Phone Number: ?01/01/2022, 12:44 PM ? ?Clinical Narrative:                 ? ? ?Transition of Care (TOC) Screening Note ? ? ?Patient Details  ?Name: Carmen [name] ?Date of Birth: [birth year] ? ? ?Transition of Care (TOC) CM/SW Contact:    ?Tawanna Cooler, RN ?Phone Number: ?01/01/2022, 12:44 PM ? ? ? ?Transition of Care Department [facility]) has reviewed patient and no TOC needs have been identified at this time. We will continue to monitor patient advancement through interdisciplinary progression rounds. If new patient transition needs arise, please place a TOC consult. ?  ? ? ?Expected Discharge Plan: Home/Self Care ?Barriers to Discharge: Continued Medical Work up ? ? ? ?Expected Discharge Plan and Services ?Expected Discharge Plan: Home/Self Care ?  ?  ?  ?Prior Living Arrangements/Services ?  ?Lives with:: Domestic Partner ?Patient language and need for interpreter reviewed:: Yes ?       ?Need for Family Participation in Patient Care: Yes (Comment) ?Care giver support system in place?: Yes (comment) ?  ?Criminal Activity/Legal Involvement Pertinent to Current Situation/Hospitalization: No - Comment as needed ? ?Activities of Daily Living ?Home Assistive Devices/Equipment: None ?ADL Screening (condition at time of admission) ?Patient's cognitive ability adequate to safely complete daily activities?: Yes ?Is the patient deaf or have difficulty hearing?: No ?Does the patient have difficulty seeing, even when wearing glasses/contacts?: No ?Does the patient have difficulty concentrating, remembering, or making decisions?: No ?Patient able to express need for assistance with ADLs?: No ?Does the patient have difficulty dressing or bathing?: No ?Independently performs ADLs?: Yes (appropriate for developmental age) ?Does  the patient have difficulty walking or climbing stairs?: No ?Weakness of Legs: None ?Weakness of Arms/Hands: None ? ?  ?  ?Orientation: : Oriented to Self, Oriented to Place, Oriented to  Time, Oriented to Situation ?  ?Psych Involvement: No (comment) ? ?Admission diagnosis:  Sickle cell pain crisis ([place]) [D57.00] ?Patient Active Problem List  ? Diagnosis Date Noted  ? Acute chest syndrome due to sickle cell crisis ([place]) 09/05/2021  ? Chronic pain syndrome 08/21/2021  ? Moderate episode of recurrent major depressive disorder ([place]) 05/28/2021  ? Major depressive disorder 05/22/2021  ? Tobacco abuse 05/21/2021  ? Sickle cell disease with crisis ([place]) 03/23/2021  ? Acute pain of right shoulder   ? Sickle cell anemia with crisis ([place]) 02/16/2021  ? Community acquired pneumonia of right lower lobe of lung   ? Fever of unknown origin 10/14/2020  ? Sickle cell crisis ([place]) 03/31/2020  ? Bacterial vaginitis 01/04/2020  ? Acute cystitis without hematuria   ? Dysuria 12/31/2019  ? Urine leukocytes   ? Sickle cell pain crisis ([place]) 09/10/2019  ? Sickle cell disease ([place]) 12/04/2018  ? Alpha thalassemia silent carrier 12/04/2018  ? Chronic prescription opiate use 03/13/2018  ? Chronic musculoskeletal pain 03/13/2018  ? Vitamin D deficiency 01/13/2018  ? Leukocytosis 08/21/2017  ? Hypokalemia 06/27/2017  ? 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 Hb-SS disease without crisis ([place]) 08/15/2016  ? Anemia of chronic disease   ? Chest pain 11/10/2015  ? Thrombocytosis 11/22/2014  ? Systolic ejection murmur 76/16/0737  ? Sickle cell anemia of mother during pregnancy [facility]) 05/16/2014  ? ?PCP:  Vevelyn Francois, NP ?Pharmacy:   ?[id number] [place], [place] ?[address] ?[place] [address] ?Phone: [phone] Fax: [phone] ? ? ? ? ?Social Determinants of Health (SDOH) Interventions ?  ? ?Readmission Risk Interventions ?Readmission Risk  Prevention Plan 11/03/2021 10/28/2021 10/21/2021  ?Transportation Screening Complete - Complete  ?PCP or Specialist Appt within 3-5 Days - - -  ?Not Complete comments - - -  ?[place] or [place] or Home Care Consult comments - - -  ?Social Work Consult for [place] Planning/Counseling - - -  ?Palliative Care Screening - - -  ?Medication Review Press photographer) Complete Complete Complete  ?PCP or Specialist appointment within 3-5 days of discharge - Complete Complete  ?[place] or Home Care Consult Complete - Complete  ?SW Recovery Care/Counseling Consult Complete Complete Complete  ?Palliative Care Screening Not Applicable Not Applicable Not Applicable  ?[place] Not Applicable Not Applicable Not Applicable  ?Some recent data might be hidden  ? ? ? ?

## 2022-01-01 NOTE — Progress Notes (Signed)
Subjective: ?Carmen [name] is a 31 year old female with a medical history significant for sickle cell disease, chronic pain syndrome, opiate dependence and tolerance, history of anemia of chronic disease, and history of depression was admitted for sickle cell pain crisis. ?Patient states that pain intensity has not improved overnight and she is rating her pain at 10/10. ?She denies any headache, chest pain, shortness of breath, urinary symptoms ? ?Objective: ? ?Vital signs in last 24 hours: ? ?Vitals:  ? 01/01/22 1136 01/01/22 1208 01/01/22 1413 01/01/22 1644  ?BP: (!) 106/54  99/69   ?Pulse: 71  79   ?Resp: 14 14 17 20   ?Temp:      ?TempSrc:      ?SpO2: 99% 97% 100% 94%  ?Weight:      ?Height:      ? ? ?Intake/Output from previous day: ? ? ?Intake/Output Summary (Last 24 hours) at 01/01/2022 1658 ?Last data filed at 01/01/2022 1506 ?Gross per 24 hour  ?Intake 2413.86 ml  ?Output --  ?Net 2413.86 ml  ? ? ?Physical Exam: ?General: Alert, awake, oriented x3, in no acute distress.  ?HEENT: [place]/AT PEERL, EOMI ?Neck: Trachea midline,  no masses, no thyromegal,y no JVD, no carotid bruit ?OROPHARYNX:  Moist, No exudate/ erythema/lesions.  ?Heart: Regular rate and rhythm, without murmurs, rubs, gallops, PMI non-displaced, no heaves or thrills on palpation.  ?Lungs: Clear to auscultation, no wheezing or rhonchi noted. No increased vocal fremitus resonant to percussion  ?Abdomen: Soft, nontender, nondistended, positive bowel sounds, no masses no hepatosplenomegaly noted.Marland Kitchen  ?Neuro: No focal neurological deficits noted cranial nerves II through XII grossly intact. DTRs 2+ bilaterally upper and lower extremities. Strength 5 out of 5 in bilateral upper and lower extremities. ?Musculoskeletal: No warm swelling or erythema around joints, no spinal tenderness noted. ?Psychiatric: Patient alert and oriented x3, good insight and cognition, good recent to remote recall. ?Lymph node survey: No cervical axillary or inguinal lymphadenopathy  noted. ? ?Lab Results: ? ?Basic Metabolic Panel: ?   ?Component Value Date/Time  ? NA 138 12/31/2021 0816  ? NA 142 07/02/2020 1040  ? K 3.9 12/31/2021 0816  ? CL 108 12/31/2021 0816  ? CO2 24 12/31/2021 0816  ? BUN 10 12/31/2021 0816  ? BUN 8 07/02/2020 1040  ? CREATININE 0.60 12/31/2021 0816  ? GLUCOSE 103 (H) 12/31/2021 0816  ? CALCIUM 9.4 12/31/2021 0816  ? ?CBC: ?   ?Component Value Date/Time  ? WBC 7.7 12/31/2021 0816  ? HGB 10.8 (L) 12/31/2021 0816  ? HGB 9.8 (L) 07/02/2020 1040  ? HCT 30.9 (L) 12/31/2021 0816  ? HCT 28.9 (L) 07/02/2020 1040  ? PLT 519 (H) 12/31/2021 0816  ? PLT 628 (H) 07/02/2020 1040  ? MCV 85.4 12/31/2021 0816  ? MCV 88 07/02/2020 1040  ? NEUTROABS 3.2 12/31/2021 0816  ? NEUTROABS 3.0 07/02/2020 1040  ? LYMPHSABS 3.2 12/31/2021 0816  ? LYMPHSABS 3.5 (H) 07/02/2020 1040  ? MONOABS 0.9 12/31/2021 0816  ? EOSABS 0.3 12/31/2021 0816  ? EOSABS 0.2 07/02/2020 1040  ? BASOSABS 0.0 12/31/2021 0816  ? BASOSABS 0.1 07/02/2020 1040  ? ? ?Recent Results (from the past 240 hour(s))  ?Resp Panel by RT-PCR (Flu A&B, Covid) Nasopharyngeal Swab     Status: None  ? Collection Time: 12/31/21  2:18 PM  ? Specimen: Nasopharyngeal Swab; Nasopharyngeal(NP) swabs in vial transport medium  ?Result Value Ref Range Status  ? SARS Coronavirus 2 by RT PCR NEGATIVE NEGATIVE Final  ?  Comment: (NOTE) ?SARS-CoV-2 target nucleic  acids are NOT DETECTED. ? ?The SARS-CoV-2 RNA is generally detectable in upper respiratory ?specimens during the acute phase of infection. The lowest ?concentration of SARS-CoV-2 viral copies this assay can detect is ?138 copies/mL. A negative result does not preclude SARS-Cov-2 ?infection and should not be used as the sole basis for treatment or ?other patient management decisions. A negative result may occur with  ?improper specimen collection/handling, submission of specimen other ?than nasopharyngeal swab, presence of viral mutation(s) within the ?areas targeted by this assay, and inadequate  number of viral ?copies(<138 copies/mL). A negative result must be combined with ?clinical observations, patient history, and epidemiological ?information. The expected result is Negative. ? ?Fact Sheet for Patients:  ?EntrepreneurPulse.com.au ? ?Fact Sheet for Healthcare Providers:  ?IncredibleEmployment.be ? ?This test is no t yet approved or cleared by the [place] FDA and  ?has been authorized for detection and/or diagnosis of SARS-CoV-2 by ?FDA under an Emergency Use Authorization (EUA). This EUA will remain  ?in effect (meaning this test can be used) for the duration of the ?COVID-19 declaration under Section 564(b)(1) of the Act, 21 ?U.S.C.section 360bbb-3(b)(1), unless the authorization is terminated  ?or revoked sooner.  ? ? ?  ? Influenza A by PCR NEGATIVE NEGATIVE Final  ? Influenza B by PCR NEGATIVE NEGATIVE Final  ?  Comment: (NOTE) ?The Xpert Xpress SARS-CoV-2/FLU/RSV plus assay is intended as an aid ?in the diagnosis of influenza from Nasopharyngeal swab specimens and ?should not be used as a sole basis for treatment. Nasal washings and ?aspirates are unacceptable for Xpert Xpress SARS-CoV-2/FLU/RSV ?testing. ? ?Fact Sheet for Patients: ?EntrepreneurPulse.com.au ? ?Fact Sheet for Healthcare Providers: ?IncredibleEmployment.be ? ?This test is not yet approved or cleared by the [place] FDA and ?has been authorized for detection and/or diagnosis of SARS-CoV-2 by ?FDA under an Emergency Use Authorization (EUA). This EUA will remain ?in effect (meaning this test can be used) for the duration of the ?COVID-19 declaration under Section 564(b)(1) of the Act, 21 U.S.C. ?section 360bbb-3(b)(1), unless the authorization is terminated or ?revoked. ? ?Performed at [facility], [address] ?[place],  [address] ?  ? ? ?Studies/Results: ?No results found. ? ?Medications: ?Scheduled Meds: ? DULoxetine  60 mg  Oral Daily  ? enoxaparin (LOVENOX) injection  40 mg Subcutaneous M38T  ? folic acid  1 mg Oral Daily  ? HYDROmorphone   Intravenous Q4H  ? ketorolac  15 mg Intravenous Q6H  ? morphine  60 mg Oral Q12H  ? senna-docusate  1 tablet Oral BID  ? ?Continuous Infusions: ? sodium chloride 10 mL/hr at 01/01/22 1506  ? diphenhydrAMINE Stopped (12/31/21 0912)  ? ?PRN Meds:.diphenhydrAMINE, naloxone **AND** sodium chloride flush, ondansetron (ZOFRAN) IV, polyethylene glycol ? ?Consultants: ?none ? ?Procedures: ?none ? ?Antibiotics: ?none ? ?Assessment/Plan: ?Principal Problem: ?  Sickle cell pain crisis ([place]) ?Active Problems: ?  Anemia of chronic disease ?  Major depressive disorder ?  Chronic pain syndrome ? ?Sickle cell disease with pain crisis: ?Decrease IV fluids to KVO ?Continue IV Dilaudid PCA without changes ?Toradol 15 mg IV every 6 hours for total of 5 days ?Monitor vital signs very closely, reevaluate pain scale regularly, and omental oxygen as needed. ? ?Chronic pain syndrome: ?Continue home medications ? ?Major depressive disorder: ?Stable.  No suicidal homicidal intentions on today.  Continue home medications. ? ?History of anemia of chronic disease: ? Patient's hemoglobin is stable and consistent with her baseline.  There is no clinical indication for blood transfusion at this  time. ? ?Code Status: Full Code ?Family Communication: N/A ?Disposition Plan: Not yet ready for discharge ? ?[name]  APRN, MSN, FNP-C ?Patient [place] ?[facility] ?[address]  ?[place], [place] [address] ?[phone] ? ?If 7PM-7AM, please contact night-coverage. ? ?01/01/2022, 4:58 PM  LOS: 0 days   ?

## 2022-01-02 DIAGNOSIS — G894 Chronic pain syndrome: Secondary | ICD-10-CM | POA: Diagnosis not present

## 2022-01-02 DIAGNOSIS — D638 Anemia in other chronic diseases classified elsewhere: Secondary | ICD-10-CM | POA: Diagnosis not present

## 2022-01-02 DIAGNOSIS — F329 Major depressive disorder, single episode, unspecified: Secondary | ICD-10-CM | POA: Diagnosis not present

## 2022-01-02 DIAGNOSIS — D5700 Hb-SS disease with crisis, unspecified: Secondary | ICD-10-CM | POA: Diagnosis not present

## 2022-01-02 MED ORDER — HYDROMORPHONE 1 MG/ML IV SOLN
INTRAVENOUS | Status: DC
  Administered 2022-01-02: 17.4 mg via INTRAVENOUS
  Administered 2022-01-02: 0.7 mg via INTRAVENOUS
  Administered 2022-01-02: 2.8 mg via INTRAVENOUS
  Administered 2022-01-03: 7.7 mg via INTRAVENOUS
  Administered 2022-01-03: 7 mg via INTRAVENOUS
  Administered 2022-01-03: 5.6 mg via INTRAVENOUS
  Administered 2022-01-03: 8.4 mg via INTRAVENOUS
  Filled 2022-01-02 (×2): qty 30

## 2022-01-02 MED ORDER — OXYCODONE HCL 5 MG PO TABS
10.0000 mg | ORAL_TABLET | Freq: Four times a day (QID) | ORAL | Status: DC | PRN
  Administered 2022-01-02 – 2022-01-03 (×3): 10 mg via ORAL
  Filled 2022-01-02 (×3): qty 2

## 2022-01-02 MED ORDER — CYCLOBENZAPRINE HCL 10 MG PO TABS
10.0000 mg | ORAL_TABLET | Freq: Two times a day (BID) | ORAL | Status: DC
  Administered 2022-01-02 – 2022-01-04 (×5): 10 mg via ORAL
  Filled 2022-01-02 (×5): qty 1

## 2022-01-02 MED ORDER — HYDROMORPHONE 1 MG/ML IV SOLN: INTRAVENOUS | Status: DC

## 2022-01-02 NOTE — Progress Notes (Signed)
Patient ID: Carmen [name], female   DOB: 1991/03/03, 31 y.o.   MRN: [id number] ?Subjective: ?Carmen [name] is a 31 year old female with a medical history significant for sickle cell disease, chronic pain syndrome, opiate dependence and tolerance, history of anemia of chronic disease, and history of depression was admitted for sickle cell pain crisis. ? ?Patient is complaining of significant amount of pain in her lower back and lower extremities, she currently rates her pain at 10/10.  She says she is not getting any relief from her current pain management settings.  She has tried to ambulate but not able to walk because of pain.  She denies any fever, headache, cough, shortness of breath, chest pain, nausea, vomiting or diarrhea. ? ?Objective: ? ?Vital signs in last 24 hours: ? ?Vitals:  ? 01/01/22 2217 01/01/22 2242 01/02/22 0341 01/02/22 1029  ?BP: 116/67   117/68  ?Pulse: 78   98  ?Resp: '16 16 17 16  '$ ?Temp: (!) 97.5 ?F (36.4 ?C)   98.6 ?F (37 ?C)  ?TempSrc: Oral   Oral  ?SpO2: 95% 100% 99% 94%  ?Weight:      ?Height:      ? ? ?Intake/Output from previous day: ? ? ?Intake/Output Summary (Last 24 hours) at 01/02/2022 1208 ?Last data filed at 01/01/2022 1918 ?Gross per 24 hour  ?Intake 1727.31 ml  ?Output --  ?Net 1727.31 ml  ? ? ?Physical Exam: ?General: Alert, awake, oriented x3, in no acute distress.  ?HEENT: [place]/AT [place]ER[place], EOMI ?Neck: Trachea midline,  no masses, no thyromegal,y no JVD, no carotid bruit ?OROPHARYNX:  Moist, No exudate/ erythema/lesions.  ?Heart: Regular rate and rhythm, without murmurs, rubs, gallops, PMI non-displaced, no heaves or thrills on palpation.  ?Lungs: Clear to auscultation, no wheezing or rhonchi noted. No increased vocal fremitus resonant to percussion  ?Abdomen: Soft, nontender, nondistended, positive bowel sounds, no masses no hepatosplenomegaly noted.Marland Kitchen  ?Neuro: No focal neurological deficits noted cranial nerves II through XII grossly intact. DTRs 2+ bilaterally upper and lower  extremities. Strength 5 out of 5 in bilateral upper and lower extremities. ?Musculoskeletal: No warm swelling or erythema around joints, no spinal tenderness noted. ?Psychiatric: Patient alert and oriented x3, good insight and cognition, good recent to remote recall. ?Lymph node survey: No cervical axillary or inguinal lymphadenopathy noted. ? ?Lab Results: ? ?Basic Metabolic Panel: ?   ?Component Value Date/Time  ? NA 138 12/31/2021 0816  ? NA 142 07/02/2020 1040  ? K 3.9 12/31/2021 0816  ? CL 108 12/31/2021 0816  ? CO2 24 12/31/2021 0816  ? BUN 10 12/31/2021 0816  ? BUN 8 07/02/2020 1040  ? CREATININE 0.60 12/31/2021 0816  ? GLUCOSE 103 (H) 12/31/2021 0816  ? CALCIUM 9.4 12/31/2021 0816  ? ?CBC: ?   ?Component Value Date/Time  ? WBC 7.7 12/31/2021 0816  ? HGB 10.8 (L) 12/31/2021 0816  ? HGB 9.8 (L) 07/02/2020 1040  ? HCT 30.9 (L) 12/31/2021 0816  ? HCT 28.9 (L) 07/02/2020 1040  ? PLT 519 (H) 12/31/2021 0816  ? PLT 628 (H) 07/02/2020 1040  ? MCV 85.4 12/31/2021 0816  ? MCV 88 07/02/2020 1040  ? NEUTROABS 3.2 12/31/2021 0816  ? NEUTROABS 3.0 07/02/2020 1040  ? LYMPHSABS 3.2 12/31/2021 0816  ? LYMPHSABS 3.5 (H) 07/02/2020 1040  ? MONOABS 0.9 12/31/2021 0816  ? EOSABS 0.3 12/31/2021 0816  ? EOSABS 0.2 07/02/2020 1040  ? BASOSABS 0.0 12/31/2021 0816  ? BASOSABS 0.1 07/02/2020 1040  ? ? ?Recent Results (from the past  240 hour(s))  ?Resp Panel by RT-PCR (Flu A&B, Covid) Nasopharyngeal Swab     Status: None  ? Collection Time: 12/31/21  2:18 PM  ? Specimen: Nasopharyngeal Swab; Nasopharyngeal(NP) swabs in vial transport medium  ?Result Value Ref Range Status  ? SARS Coronavirus 2 by RT PCR NEGATIVE NEGATIVE Final  ?  Comment: (NOTE) ?SARS-CoV-2 target nucleic acids are NOT DETECTED. ? ?The SARS-CoV-2 RNA is generally detectable in upper respiratory ?specimens during the acute phase of infection. The lowest ?concentration of SARS-CoV-2 viral copies this assay can detect is ?138 copies/mL. A negative result does not  preclude SARS-Cov-2 ?infection and should not be used as the sole basis for treatment or ?other patient management decisions. A negative result may occur with  ?improper specimen collection/handling, submission of specimen other ?than nasopharyngeal swab, presence of viral mutation(s) within the ?areas targeted by this assay, and inadequate number of viral ?copies(<138 copies/mL). A negative result must be combined with ?clinical observations, patient history, and epidemiological ?information. The expected result is Negative. ? ?Fact Sheet for Patients:  ?EntrepreneurPulse.com.au ? ?Fact Sheet for Healthcare Providers:  ?IncredibleEmployment.be ? ?This test is no t yet approved or cleared by the [place] FDA and  ?has been authorized for detection and/or diagnosis of SARS-CoV-2 by ?FDA under an Emergency Use Authorization (EUA). This EUA will remain  ?in effect (meaning this test can be used) for the duration of the ?COVID-19 declaration under Section 564(b)(1) of the Act, 21 ?U.S.C.section 360bbb-3(b)(1), unless the authorization is terminated  ?or revoked sooner.  ? ? ?  ? Influenza A by PCR NEGATIVE NEGATIVE Final  ? Influenza B by PCR NEGATIVE NEGATIVE Final  ?  Comment: (NOTE) ?The Xpert Xpress SARS-CoV-2/FLU/RSV plus assay is intended as an aid ?in the diagnosis of influenza from Nasopharyngeal swab specimens and ?should not be used as a sole basis for treatment. Nasal washings and ?aspirates are unacceptable for Xpert Xpress SARS-CoV-2/FLU/RSV ?testing. ? ?Fact Sheet for Patients: ?EntrepreneurPulse.com.au ? ?Fact Sheet for Healthcare Providers: ?IncredibleEmployment.be ? ?This test is not yet approved or cleared by the [place] FDA and ?has been authorized for detection and/or diagnosis of SARS-CoV-2 by ?FDA under an Emergency Use Authorization (EUA). This EUA will remain ?in effect (meaning this test can be used) for the  duration of the ?COVID-19 declaration under Section 564(b)(1) of the Act, 21 U.S.C. ?section 360bbb-3(b)(1), unless the authorization is terminated or ?revoked. ? ?Performed at [facility], [address] ?[place], [place] [address] ?  ? ? ?Studies/Results: ?No results found. ? ?Medications: ?Scheduled Meds: ? cyclobenzaprine  10 mg Oral BID  ? DULoxetine  60 mg Oral Daily  ? enoxaparin (LOVENOX) injection  40 mg Subcutaneous P95K  ? folic acid  1 mg Oral Daily  ? HYDROmorphone   Intravenous Q4H  ? ketorolac  15 mg Intravenous Q6H  ? morphine  60 mg Oral Q12H  ? senna-docusate  1 tablet Oral BID  ? ?Continuous Infusions: ? sodium chloride 10 mL/hr at 01/01/22 1506  ? ?PRN Meds:.diphenhydrAMINE, naloxone **AND** sodium chloride flush, ondansetron (ZOFRAN) IV, Oxycodone HCl, polyethylene glycol ? ?Consultants: ?None ? ?Procedures: ?None ? ?Antibiotics: ?None ? ?Assessment/Plan: ?Principal Problem: ?  Sickle cell pain crisis ([place]) ?Active Problems: ?  Anemia of chronic disease ?  Major depressive disorder ?  Chronic pain syndrome ? ?Hb Sickle Cell Disease with Pain crisis: Continue IVF at [facility], adjust weight based Dilaudid PCA to 0.6/10/4, continue IV Toradol 15 mg Q 6 H for total  of 5 days, continue oral home pain medications as ordered.  Monitor vitals very closely, Re-evaluate pain scale regularly, 2 L of Oxygen by [place]. ?Anemia of Chronic Disease: Hemoglobin is stable at baseline today.  There is no clinical indication for blood transfusion at this time.  We will continue to monitor closely and transfuse as appropriate. ?Chronic pain Syndrome: Continue home pain medications. ?Major depressive disorder: Clinically stable. Patient denies any suicidal ideations or thoughts. We will continue to monitor closely and continue her home medications. ? ?Code Status: Full Code ?Family Communication: N/A ?Disposition Plan: Not yet ready for discharge ? ?Karmen Altamirano  ?If 7PM-7AM, please contact  night-coverage. ? ?01/02/2022, 12:08 PM  LOS: 1 day   ?

## 2022-01-03 DIAGNOSIS — D638 Anemia in other chronic diseases classified elsewhere: Secondary | ICD-10-CM | POA: Diagnosis not present

## 2022-01-03 DIAGNOSIS — G894 Chronic pain syndrome: Secondary | ICD-10-CM | POA: Diagnosis not present

## 2022-01-03 DIAGNOSIS — D5700 Hb-SS disease with crisis, unspecified: Secondary | ICD-10-CM | POA: Diagnosis not present

## 2022-01-03 DIAGNOSIS — F329 Major depressive disorder, single episode, unspecified: Secondary | ICD-10-CM | POA: Diagnosis not present

## 2022-01-03 MED ORDER — HYDROMORPHONE 1 MG/ML IV SOLN
INTRAVENOUS | Status: DC
  Administered 2022-01-03: 2.8 mg via INTRAVENOUS
  Administered 2022-01-03: 6.3 mg via INTRAVENOUS
  Administered 2022-01-04: 8.5 mg via INTRAVENOUS
  Administered 2022-01-04: 30 mg via INTRAVENOUS
  Administered 2022-01-04: 2.5 mg via INTRAVENOUS
  Filled 2022-01-03: qty 30

## 2022-01-03 NOTE — Progress Notes (Signed)
?   01/03/22 2040  ?Assess: MEWS Score  ?Temp 98.2 ?F (36.8 ?C)  ?BP 113/70  ?Pulse Rate (!) 114  ?Resp (!) 21  ?Level of Consciousness Alert  ?SpO2 98 %  ?O2 Device Room Air  ?Assess: MEWS Score  ?MEWS Temp 0  ?MEWS Systolic 0  ?MEWS Pulse 2  ?MEWS RR 1  ?MEWS LOC 0  ?MEWS Score 3  ?MEWS Score Color Yellow  ?Assess: if the MEWS score is Yellow or Red  ?Were vital signs taken at a resting state? Yes  ?Focused Assessment No change from prior assessment  ?Does the patient meet 2 or more of the SIRS criteria? Yes  ?Does the patient have a confirmed or suspected source of infection? No  ?MEWS guidelines implemented *See Row Information* Yes  ?Treat  ?MEWS Interventions Administered scheduled meds/treatments;Escalated (See documentation below)  ?Pain Scale 0-10  ?Pain Score 8  ?Pain Type Chronic pain  ?Pain Location Back  ?Pain Orientation Upper  ?Pain Radiating Towards BLE  ?Pain Descriptors / Indicators Aching;Discomfort  ?Pain Frequency Constant  ?Pain Intervention(s) Medication (See eMAR);Pain pump;PCA encouraged  ?Take Vital Signs  ?Increase Vital Sign Frequency  Yellow: Q 2hr X 2 then Q 4hr X 2, if remains yellow, continue Q 4hrs  ?Escalate  ?MEWS: Escalate Yellow: discuss with charge nurse/RN and consider discussing with provider and RRT  ?Notify: Charge Nurse/RN  ?Name of Charge Nurse/RN Notified Renita RN  ?Date Charge Nurse/RN Notified 01/03/22  ?Time Charge Nurse/RN Notified 2111  ?Document  ?Patient Outcome Stabilized after interventions  ?Progress note created (see row info) Yes  ?Assess: SIRS CRITERIA  ?SIRS Temperature  0  ?SIRS Pulse 1  ?SIRS Respirations  1  ?SIRS WBC 0  ?SIRS Score Sum  2  ? ? ?

## 2022-01-03 NOTE — Progress Notes (Signed)
Patient ID: Carmen [name], female   DOB: [birth year], 31 y.o.   MRN: [id number] ?Subjective: ?Carmen [name] is a 31 year old female with a medical history significant for sickle cell disease, chronic pain syndrome, opiate dependence and tolerance, history of anemia of chronic disease, and history of depression was admitted for sickle cell pain crisis. ? ?Patient claims her pain has slightly improved today, she said it has reduced to 8/10 as against her goal of below 5/10.  She is now able to ambulate to the bathroom and back.  She denies any fever, cough, chest pain, shortness of breath, nausea, vomiting or diarrhea. ? ?Objective: ? ?Vital signs in last 24 hours: ? ?Vitals:  ? 01/03/22 0948 01/03/22 1050 01/03/22 1236 01/03/22 1434  ?BP:  112/65  126/77  ?Pulse:  99  99  ?Resp: '20 14 14 16  '$ ?Temp:    98.2 ?F (36.8 ?C)  ?TempSrc:    Oral  ?SpO2: 97% 94% 97% 90%  ?Weight:      ?Height:      ? ? ?Intake/Output from previous day: ? ? ?Intake/Output Summary (Last 24 hours) at 01/03/2022 1516 ?Last data filed at 01/03/2022 1240 ?Gross per 24 hour  ?Intake 458.01 ml  ?Output --  ?Net 458.01 ml  ? ? ? ?Physical Exam: ?General: Alert, awake, oriented x3, in no acute distress.  ?HEENT: [place]/AT PEERL, EOMI ?Neck: Trachea midline,  no masses, no thyromegal,y no JVD, no carotid bruit ?OROPHARYNX:  Moist, No exudate/ erythema/lesions.  ?Heart: Regular rate and rhythm, without murmurs, rubs, gallops, PMI non-displaced, no heaves or thrills on palpation.  ?Lungs: Clear to auscultation, no wheezing or rhonchi noted. No increased vocal fremitus resonant to percussion  ?Abdomen: Soft, nontender, nondistended, positive bowel sounds, no masses no hepatosplenomegaly noted.Marland Kitchen  ?Neuro: No focal neurological deficits noted cranial nerves II through XII grossly intact. DTRs 2+ bilaterally upper and lower extremities. Strength 5 out of 5 in bilateral upper and lower extremities. ?Musculoskeletal: No warm swelling or erythema around joints, no spinal  tenderness noted. ?Psychiatric: Patient alert and oriented x3, good insight and cognition, good recent to remote recall. ?Lymph node survey: No cervical axillary or inguinal lymphadenopathy noted. ? ?Lab Results: ? ?Basic Metabolic Panel: ?   ?Component Value Date/Time  ? NA 138 12/31/2021 0816  ? NA 142 07/02/2020 1040  ? K 3.9 12/31/2021 0816  ? CL 108 12/31/2021 0816  ? CO2 24 12/31/2021 0816  ? BUN 10 12/31/2021 0816  ? BUN 8 07/02/2020 1040  ? CREATININE 0.60 12/31/2021 0816  ? GLUCOSE 103 (H) 12/31/2021 0816  ? CALCIUM 9.4 12/31/2021 0816  ? ?CBC: ?   ?Component Value Date/Time  ? WBC 7.7 12/31/2021 0816  ? HGB 10.8 (L) 12/31/2021 0816  ? HGB 9.8 (L) 07/02/2020 1040  ? HCT 30.9 (L) 12/31/2021 0816  ? HCT 28.9 (L) 07/02/2020 1040  ? PLT 519 (H) 12/31/2021 0816  ? PLT 628 (H) 07/02/2020 1040  ? MCV 85.4 12/31/2021 0816  ? MCV 88 07/02/2020 1040  ? NEUTROABS 3.2 12/31/2021 0816  ? NEUTROABS 3.0 07/02/2020 1040  ? LYMPHSABS 3.2 12/31/2021 0816  ? LYMPHSABS 3.5 (H) 07/02/2020 1040  ? MONOABS 0.9 12/31/2021 0816  ? EOSABS 0.3 12/31/2021 0816  ? EOSABS 0.2 07/02/2020 1040  ? BASOSABS 0.0 12/31/2021 0816  ? BASOSABS 0.1 07/02/2020 1040  ? ? ?Recent Results (from the past 240 hour(s))  ?Resp Panel by RT-PCR (Flu A&B, Covid) Nasopharyngeal Swab     Status: None  ? Collection  Time: 12/31/21  2:18 PM  ? Specimen: Nasopharyngeal Swab; Nasopharyngeal(NP) swabs in vial transport medium  ?Result Value Ref Range Status  ? SARS Coronavirus 2 by RT PCR NEGATIVE NEGATIVE Final  ?  Comment: (NOTE) ?SARS-CoV-2 target nucleic acids are NOT DETECTED. ? ?The SARS-CoV-2 RNA is generally detectable in upper respiratory ?specimens during the acute phase of infection. The lowest ?concentration of SARS-CoV-2 viral copies this assay can detect is ?138 copies/mL. A negative result does not preclude SARS-Cov-2 ?infection and should not be used as the sole basis for treatment or ?other patient management decisions. A negative result may occur  with  ?improper specimen collection/handling, submission of specimen other ?than nasopharyngeal swab, presence of viral mutation(s) within the ?areas targeted by this assay, and inadequate number of viral ?copies(<138 copies/mL). A negative result must be combined with ?clinical observations, patient history, and epidemiological ?information. The expected result is Negative. ? ?Fact Sheet for Patients:  ?EntrepreneurPulse.com.au ? ?Fact Sheet for Healthcare Providers:  ?IncredibleEmployment.be ? ?This test is no t yet approved or cleared by the [place] FDA and  ?has been authorized for detection and/or diagnosis of SARS-CoV-2 by ?FDA under an Emergency Use Authorization (EUA). This EUA will remain  ?in effect (meaning this test can be used) for the duration of the ?COVID-19 declaration under Section 564(b)(1) of the Act, 21 ?U.S.C.section 360bbb-3(b)(1), unless the authorization is terminated  ?or revoked sooner.  ? ? ?  ? Influenza A by PCR NEGATIVE NEGATIVE Final  ? Influenza B by PCR NEGATIVE NEGATIVE Final  ?  Comment: (NOTE) ?The Xpert Xpress SARS-CoV-2/FLU/RSV plus assay is intended as an aid ?in the diagnosis of influenza from Nasopharyngeal swab specimens and ?should not be used as a sole basis for treatment. Nasal washings and ?aspirates are unacceptable for Xpert Xpress SARS-CoV-2/FLU/RSV ?testing. ? ?Fact Sheet for Patients: ?EntrepreneurPulse.com.au ? ?Fact Sheet for Healthcare Providers: ?IncredibleEmployment.be ? ?This test is not yet approved or cleared by the [place] FDA and ?has been authorized for detection and/or diagnosis of SARS-CoV-2 by ?FDA under an Emergency Use Authorization (EUA). This EUA will remain ?in effect (meaning this test can be used) for the duration of the ?COVID-19 declaration under Section 564(b)(1) of the Act, 21 U.S.C. ?section 360bbb-3(b)(1), unless the authorization is terminated  or ?revoked. ? ?Performed at [facility], [address] ?[place], [place] [address] ?  ? ? ?Studies/Results: ?No results found. ? ?Medications: ?Scheduled Meds: ? cyclobenzaprine  10 mg Oral BID  ? DULoxetine  60 mg Oral Daily  ? enoxaparin (LOVENOX) injection  40 mg Subcutaneous D62I  ? folic acid  1 mg Oral Daily  ? HYDROmorphone   Intravenous Q4H  ? ketorolac  15 mg Intravenous Q6H  ? morphine  60 mg Oral Q12H  ? senna-docusate  1 tablet Oral BID  ? ?Continuous Infusions: ? sodium chloride 10 mL/hr at 01/03/22 1240  ? ?PRN Meds:.diphenhydrAMINE, naloxone **AND** sodium chloride flush, ondansetron (ZOFRAN) IV, oxyCODONE, polyethylene glycol ? ?Consultants: ?None ? ?Procedures: ?None ? ?Antibiotics: ?None ? ?Assessment/Plan: ?Principal Problem: ?  Sickle cell pain crisis ([place]) ?Active Problems: ?  Anemia of chronic disease ?  Major depressive disorder ?  Chronic pain syndrome ? ?Hb Sickle Cell Disease with Pain crisis: Continue IVF at [facility], begin to wean weight based Dilaudid PCA, continue IV Toradol 15 mg Q 6 H for total of 5 days, continue oral home pain medications as ordered.  Monitor vitals very closely, Re-evaluate pain scale regularly, 2 L of  Oxygen by [place]. ?Anemia of Chronic Disease: Hemoglobin is stable at baseline today. There is no clinical indication for blood transfusion at this time.  We will continue to monitor closely and transfuse as appropriate. ?Chronic pain Syndrome: Continue home pain medications. ?Major depressive disorder: Clinically stable. Patient denies any suicidal ideations or thoughts. We will continue to monitor closely and continue her home medications. ? ?Code Status: Full Code ?Family Communication: N/A ?Disposition Plan: For possible discharge tomorrow morning. ? ?Tenea Sens  ?If 7PM-7AM, please contact night-coverage. ? ?01/03/2022, 3:16 PM  LOS: 2 days   ?

## 2022-01-04 DIAGNOSIS — D5700 Hb-SS disease with crisis, unspecified: Secondary | ICD-10-CM | POA: Diagnosis not present

## 2022-01-04 LAB — CBC WITH DIFFERENTIAL/PLATELET
Abs Immature Granulocytes: 0.07 10*3/uL (ref 0.00–0.07)
Basophils Absolute: 0.1 10*3/uL (ref 0.0–0.1)
Basophils Relative: 0 %
Eosinophils Absolute: 0.7 10*3/uL — ABNORMAL HIGH (ref 0.0–0.5)
Eosinophils Relative: 5 %
HCT: 22.2 % — ABNORMAL LOW (ref 36.0–46.0)
Hemoglobin: 8.2 g/dL — ABNORMAL LOW (ref 12.0–15.0)
Immature Granulocytes: 1 %
Lymphocytes Relative: 34 %
Lymphs Abs: 4.7 10*3/uL — ABNORMAL HIGH (ref 0.7–4.0)
MCH: 30.1 pg (ref 26.0–34.0)
MCHC: 36.9 g/dL — ABNORMAL HIGH (ref 30.0–36.0)
MCV: 81.6 fL (ref 80.0–100.0)
Monocytes Absolute: 1.8 10*3/uL — ABNORMAL HIGH (ref 0.1–1.0)
Monocytes Relative: 13 %
Neutro Abs: 6.7 10*3/uL (ref 1.7–7.7)
Neutrophils Relative %: 47 %
Platelets: 425 10*3/uL — ABNORMAL HIGH (ref 150–400)
RBC: 2.72 MIL/uL — ABNORMAL LOW (ref 3.87–5.11)
RDW: 15.5 % (ref 11.5–15.5)
WBC: 14.1 10*3/uL — ABNORMAL HIGH (ref 4.0–10.5)
nRBC: 0.4 % — ABNORMAL HIGH (ref 0.0–0.2)

## 2022-01-04 NOTE — Discharge Summary (Signed)
Physician Discharge Summary  Carmen [name] IWP:[id number] DOB: 1991-06-09 DOA: 12/31/2021  PCP: Vevelyn Francois, NP  Admit date: 12/31/2021  Discharge date: 01/04/2022  Discharge Diagnoses:  Principal Problem:   Sickle cell pain crisis ([place]) Active Problems:   Anemia of chronic disease   Major depressive disorder   Chronic pain syndrome   Discharge Condition: Stable  Disposition:  Pt is discharged home in good condition and is to follow up with Vevelyn Francois, NP this week to have labs evaluated. Carmen [name] is instructed to increase activity slowly and balance with rest for the next few days, and use prescribed medication to complete treatment of pain  Diet: Regular Wt Readings from Last 3 Encounters:  12/31/21 86.2 kg  12/02/21 86.2 kg  11/01/21 86.2 kg    History of present illness:  Carmen [name]  is a 31 y.o. female with a medical history significant for sickle cell disease, chronic pain syndrome, opiate dependence and tolerance, history of major depressive disorder, and history of anemia of chronic disease presents to the emergency room with complaints of low back and lower extremity pain that is consistent with her typical sickle cell crisis.  Patient states that pain intensity increased while at work 2 days prior, she states that pain suddenly started shooting down her back to lower extremities.  She currently rates her pain at 9/10.  She has [name] taking her home MS Contin and oxycodone without any sustained relief.  Patient attributes current pain crisis to changes in weather.  She denies any fever, chills, chest pain, or shortness of breath.  No nausea, vomiting, diarrhea, or urinary symptoms.  No sick contacts, recent travel, or known exposure to COVID-19.   ER course: While in the ER, hemoglobin 10.8 g/dL, consistent with patient's baseline.  WBCs 7.7 and platelets 519,000.  All other laboratory values unremarkable.  Vital signs recorded as:BP 117/74 (BP Location: Left Arm)     Pulse 70    Temp 98.2 F (36.8 C) (Oral)    Resp 16    Ht '5\' 3"'$  (1.6 m)    Wt 86.2 kg    SpO2 100%    BMI 33.66 kg/m  Patient's pain persists despite IV Dilaudid and IV fluids.  She will be admitted to [facility] and observation for management of sickle cell pain crisis.  Hospital Course:  Sickle cell disease with pain crisis:  Patient was admitted for sickle cell pain crisis and managed appropriately with IVF, IV Dilaudid via PCA and IV Toradol, as well as other adjunct therapies per sickle cell pain management protocols. Patient says that pain has improved and she feels that she can manage at home on current medication regimen.  Pain intensity is 3/10. Patient will follow up with PCP in 1 week to repeat CBC and CMP and for medication management.   Patient was therefore discharged home today in a hemodynamically stable condition.   Liani will follow-up with PCP within 1 week of this discharge. Charlesetta was counseled extensively about nonpharmacologic means of pain management, patient verbalized understanding and was appreciative of  the care received during this admission.   We discussed the need for good hydration, monitoring of hydration status, avoidance of heat, cold, stress, and infection triggers. We discussed the need to be adherent with taking  home medications. Patient was reminded of the need to seek medical attention immediately if any symptom of bleeding, anemia, or infection occurs.  Discharge Exam: Vitals:   01/04/22 0845 01/04/22 1210  BP: (!) 112/96 122/85  Pulse: (!) 115 97  Resp: 16 16  Temp: 99.4 F (37.4 C) 98.9 F (37.2 C)  SpO2: 92% 96%   Vitals:   01/04/22 0449 01/04/22 0800 01/04/22 0845 01/04/22 1210  BP: 129/83  (!) 112/96 122/85  Pulse: (!) 106  (!) 115 97  Resp: '18 18 16 16  '$ Temp: 98.3 F (36.8 C)  99.4 F (37.4 C) 98.9 F (37.2 C)  TempSrc: Oral  Oral Oral  SpO2: 93% 95% 92% 96%  Weight:      Height:        General appearance : Awake, alert, not in  any distress. Speech Clear. Not toxic looking HEENT: Atraumatic and Normocephalic, pupils equally reactive to light and accomodation Neck: Supple, no JVD. No cervical lymphadenopathy.  Chest: Good air entry bilaterally, no added sounds  CVS: S1 S2 regular, no murmurs.  Abdomen: Bowel sounds present, Non tender and not distended with no gaurding, rigidity or rebound. Extremities: B/L Lower Ext shows no edema, both legs are warm to touch Neurology: Awake alert, and oriented X 3, CN II-XII intact, Non focal Skin: No Rash  Discharge Instructions  Discharge Instructions     Discharge patient   Complete by: As directed    Discharge disposition: 01-Home or Self Care   Discharge patient date: 01/04/2022      Allergies as of 01/04/2022       Reactions   Ketamine Hives, Other (See Comments)   Makes feel funny        Medication List     TAKE these medications    betamethasone valerate ointment 0.1 % Commonly known as: VALISONE APPLY TO AFFECTED AREA TWICE A DAY Strength: 0.1 % What changed:  how much to take when to take this additional instructions   citalopram 20 MG tablet Commonly known as: CELEXA TAKE 1 TABLET BY MOUTH EVERY DAY   cyclobenzaprine 10 MG tablet Commonly known as: FLEXERIL TAKE 1 TABLET BY MOUTH TWICE A DAY   DULoxetine 30 MG capsule Commonly known as: CYMBALTA Take 2 capsules (60 mg total) by mouth daily. What changed:  when to take this reasons to take this   folic acid 1 MG tablet Commonly known as: FOLVITE Take 1 tablet (1 mg total) by mouth daily.   morphine 60 MG 12 hr tablet Commonly known as: MS Contin Take 1 tablet (60 mg total) by mouth every 12 (twelve) hours.   Oxycodone HCl 10 MG Tabs Take 1 tablet (10 mg total) by mouth every 6 (six) hours as needed for up to 15 days (pain).   Vitamin D (Ergocalciferol) 1.25 MG (50000 UNIT) Caps capsule Commonly known as: DRISDOL TAKE ONE CAPSULE BY MOUTH ONE TIME PER WEEK What changed:  how  much to take how to take this when to take this additional instructions        The results of significant diagnostics from this hospitalization (including imaging, microbiology, ancillary and laboratory) are listed below for reference.    Significant Diagnostic Studies: No results found.  Microbiology: Recent Results (from the past 240 hour(s))  Resp Panel by RT-PCR (Flu A&B, Covid) Nasopharyngeal Swab     Status: None   Collection Time: 12/31/21  2:18 PM   Specimen: Nasopharyngeal Swab; Nasopharyngeal(NP) swabs in vial transport medium  Result Value Ref Range Status   SARS Coronavirus 2 by RT PCR NEGATIVE NEGATIVE Final    Comment: (NOTE) SARS-CoV-2 target nucleic acids are NOT DETECTED.  The SARS-CoV-2 RNA is  generally detectable in upper respiratory specimens during the acute phase of infection. The lowest concentration of SARS-CoV-2 viral copies this assay can detect is 138 copies/mL. A negative result does not preclude SARS-Cov-2 infection and should not be used as the sole basis for treatment or other patient management decisions. A negative result may occur with  improper specimen collection/handling, submission of specimen other than nasopharyngeal swab, presence of viral mutation(s) within the areas targeted by this assay, and inadequate number of viral copies(<138 copies/mL). A negative result must be combined with clinical observations, patient history, and epidemiological information. The expected result is Negative.  Fact Sheet for Patients:  EntrepreneurPulse.com.au  Fact Sheet for Healthcare Providers:  IncredibleEmployment.be  This test is no t yet approved or cleared by the [place] FDA and  has [name] authorized for detection and/or diagnosis of SARS-CoV-2 by FDA under an Emergency Use Authorization (EUA). This EUA will remain  in effect (meaning this test can be used) for the duration of the COVID-19 declaration  under Section 564(b)(1) of the Act, 21 U.S.C.section 360bbb-3(b)(1), unless the authorization is terminated  or revoked sooner.       Influenza A by PCR NEGATIVE NEGATIVE Final   Influenza B by PCR NEGATIVE NEGATIVE Final    Comment: (NOTE) The Xpert Xpress SARS-CoV-2/FLU/RSV plus assay is intended as an aid in the diagnosis of influenza from Nasopharyngeal swab specimens and should not be used as a sole basis for treatment. Nasal washings and aspirates are unacceptable for Xpert Xpress SARS-CoV-2/FLU/RSV testing.  Fact Sheet for Patients: EntrepreneurPulse.com.au  Fact Sheet for Healthcare Providers: IncredibleEmployment.be  This test is not yet approved or cleared by the [place] FDA and has [name] authorized for detection and/or diagnosis of SARS-CoV-2 by FDA under an Emergency Use Authorization (EUA). This EUA will remain in effect (meaning this test can be used) for the duration of the COVID-19 declaration under Section 564(b)(1) of the Act, 21 U.S.C. section 360bbb-3(b)(1), unless the authorization is terminated or revoked.  Performed at [facility], [place] [address], [place], [place] [address]      Labs: Basic Metabolic Panel: Recent Labs  Lab 12/31/21 0816  NA 138  K 3.9  CL 108  CO2 24  GLUCOSE 103*  BUN 10  CREATININE 0.60  CALCIUM 9.4   Liver Function Tests: No results for input(s): AST, ALT, ALKPHOS, BILITOT, PROT, ALBUMIN in the last 168 hours. No results for input(s): LIPASE, AMYLASE in the last 168 hours. No results for input(s): AMMONIA in the last 168 hours. CBC: Recent Labs  Lab 12/31/21 0816 01/04/22 0238  WBC 7.7 14.1*  NEUTROABS 3.2 6.7  HGB 10.8* 8.2*  HCT 30.9* 22.2*  MCV 85.4 81.6  PLT 519* 425*   Cardiac Enzymes: No results for input(s): CKTOTAL, CKMB, CKMBINDEX, TROPONINI in the last 168 hours. BNP: Invalid input(s): POCBNP CBG: No results for input(s): GLUCAP in the  last 168 hours.  Time coordinating discharge: 50 minutes  Signed:  [name]  APRN, MSN, FNP-C [facility] [address] [place], [place] [address] [phone]  [facility] 01/04/2022, 12:24 PM

## 2022-01-04 NOTE — Plan of Care (Signed)
?  Problem: Education: ?Goal: Knowledge of vaso-occlusive preventative measures will improve ?01/04/2022 1242 by Erroll Luna, Student-RN ?Outcome: Adequate for Discharge ?01/04/2022 1242 by Erroll Luna, Student-RN ?Outcome: Adequate for Discharge ?01/04/2022 1241 by Erroll Luna, Student-RN ?Outcome: Adequate for Discharge ?Goal: Awareness of infection prevention will improve ?01/04/2022 1242 by Erroll Luna, Student-RN ?Outcome: Adequate for Discharge ?01/04/2022 1242 by Erroll Luna, Student-RN ?Outcome: Adequate for Discharge ?01/04/2022 1241 by Erroll Luna, Student-RN ?Outcome: Adequate for Discharge ?Goal: Awareness of signs and symptoms of anemia will improve ?01/04/2022 1242 by Erroll Luna, Student-RN ?Outcome: Adequate for Discharge ?01/04/2022 1242 by Erroll Luna, Student-RN ?Outcome: Adequate for Discharge ?01/04/2022 1241 by Erroll Luna, Student-RN ?Outcome: Adequate for Discharge ?Goal: Long-term complications will improve ?01/04/2022 1242 by Erroll Luna, Student-RN ?Outcome: Adequate for Discharge ?01/04/2022 1242 by Erroll Luna, Student-RN ?Outcome: Adequate for Discharge ?01/04/2022 1241 by Erroll Luna, Student-RN ?Outcome: Adequate for Discharge ?  ?Problem: Self-Care: ?Goal: Ability to incorporate actions that prevent/reduce pain crisis will improve ?01/04/2022 1242 by Erroll Luna, Student-RN ?Outcome: Adequate for Discharge ?01/04/2022 1242 by Erroll Luna, Student-RN ?Outcome: Adequate for Discharge ?01/04/2022 1241 by Erroll Luna, Student-RN ?Outcome: Adequate for Discharge ?  ?Problem: Bowel/Gastric: ?Goal: Gut motility will be maintained ?01/04/2022 1242 by Erroll Luna, Student-RN ?Outcome: Adequate for Discharge ?01/04/2022 1242 by Erroll Luna, Student-RN ?Outcome: Adequate for Discharge ?01/04/2022 1241 by Erroll Luna, Student-RN ?Outcome: Adequate for Discharge ?  ?Problem: Tissue Perfusion: ?Goal: Complications related to  inadequate tissue perfusion will be avoided or minimized ?01/04/2022 1242 by Erroll Luna, Student-RN ?Outcome: Adequate for Discharge ?01/04/2022 1242 by Erroll Luna, Student-RN ?Outcome: Adequate for Discharge ?01/04/2022 1241 by Erroll Luna, Student-RN ?Outcome: Adequate for Discharge ?  ?Problem: Respiratory: ?Goal: Pulmonary complications will be avoided or minimized ?01/04/2022 1242 by Erroll Luna, Student-RN ?Outcome: Adequate for Discharge ?01/04/2022 1242 by Erroll Luna, Student-RN ?Outcome: Adequate for Discharge ?01/04/2022 1241 by Erroll Luna, Student-RN ?Outcome: Adequate for Discharge ?Goal: Acute Chest Syndrome will be identified early to prevent complications ?01/04/2022 1242 by Erroll Luna, Student-RN ?Outcome: Adequate for Discharge ?01/04/2022 1242 by Erroll Luna, Student-RN ?Outcome: Adequate for Discharge ?01/04/2022 1241 by Erroll Luna, Student-RN ?Outcome: Adequate for Discharge ?  ?Problem: Fluid Volume: ?Goal: Ability to maintain a balanced intake and output will improve ?01/04/2022 1242 by Erroll Luna, Student-RN ?Outcome: Adequate for Discharge ?01/04/2022 1242 by Erroll Luna, Student-RN ?Outcome: Adequate for Discharge ?01/04/2022 1241 by Erroll Luna, Student-RN ?Outcome: Adequate for Discharge ?  ?Problem: Sensory: ?Goal: Pain level will decrease with appropriate interventions ?01/04/2022 1242 by Erroll Luna, Student-RN ?Outcome: Adequate for Discharge ?01/04/2022 1242 by Erroll Luna, Student-RN ?Outcome: Adequate for Discharge ?01/04/2022 1241 by Erroll Luna, Student-RN ?Outcome: Adequate for Discharge ?  ?Problem: Health Behavior: ?Goal: Postive changes in compliance with treatment and prescription regimens will improve ?01/04/2022 1242 by Erroll Luna, Student-RN ?Outcome: Adequate for Discharge ?01/04/2022 1242 by Erroll Luna, Student-RN ?Outcome: Adequate for Discharge ?01/04/2022 1241 by Erroll Luna,  Student-RN ?Outcome: Adequate for Discharge ?  ?

## 2022-01-06 ENCOUNTER — Telehealth: Payer: Self-pay | Admitting: Nurse Practitioner

## 2022-01-11 ENCOUNTER — Other Ambulatory Visit (INDEPENDENT_AMBULATORY_CARE_PROVIDER_SITE_OTHER): Payer: Medicaid Other | Admitting: Nurse Practitioner

## 2022-01-11 MED ORDER — OXYCODONE HCL 10 MG PO TABS: 10.0000 mg | ORAL_TABLET | Freq: Four times a day (QID) | ORAL | 0 refills | Status: DC | PRN

## 2022-01-13 ENCOUNTER — Emergency Department (HOSPITAL_COMMUNITY)
Admission: EM | Admit: 2022-01-13 | Discharge: 2022-01-13 | Disposition: A | Discharge status: Home / Self Care | Payer: Medicaid Other | Attending: Emergency Medicine | Admitting: Emergency Medicine

## 2022-01-13 ENCOUNTER — Other Ambulatory Visit: Payer: Self-pay | Admitting: Nurse Practitioner

## 2022-01-13 ENCOUNTER — Encounter (HOSPITAL_COMMUNITY): Payer: Self-pay

## 2022-01-13 DIAGNOSIS — K029 Dental caries, unspecified: Secondary | ICD-10-CM | POA: Diagnosis not present

## 2022-01-13 DIAGNOSIS — Z79891 Long term (current) use of opiate analgesic: Secondary | ICD-10-CM

## 2022-01-13 DIAGNOSIS — D5700 Hb-SS disease with crisis, unspecified: Secondary | ICD-10-CM | POA: Insufficient documentation

## 2022-01-13 DIAGNOSIS — D571 Sickle-cell disease without crisis: Secondary | ICD-10-CM

## 2022-01-13 DIAGNOSIS — K0889 Other specified disorders of teeth and supporting structures: Secondary | ICD-10-CM | POA: Diagnosis present

## 2022-01-13 LAB — CBC WITH DIFFERENTIAL/PLATELET
Abs Immature Granulocytes: 0.05 10*3/uL (ref 0.00–0.07)
Basophils Absolute: 0.1 10*3/uL (ref 0.0–0.1)
Basophils Relative: 1 %
Eosinophils Absolute: 0.2 10*3/uL (ref 0.0–0.5)
Eosinophils Relative: 2 %
HCT: 29.4 % — ABNORMAL LOW (ref 36.0–46.0)
Hemoglobin: 10.5 g/dL — ABNORMAL LOW (ref 12.0–15.0)
Immature Granulocytes: 1 %
Lymphocytes Relative: 35 %
Lymphs Abs: 3.7 10*3/uL (ref 0.7–4.0)
MCH: 30.2 pg (ref 26.0–34.0)
MCHC: 35.7 g/dL (ref 30.0–36.0)
MCV: 84.5 fL (ref 80.0–100.0)
Monocytes Absolute: 1 10*3/uL (ref 0.1–1.0)
Monocytes Relative: 9 %
Neutro Abs: 5.6 10*3/uL (ref 1.7–7.7)
Neutrophils Relative %: 52 %
Platelets: 557 10*3/uL — ABNORMAL HIGH (ref 150–400)
RBC: 3.48 MIL/uL — ABNORMAL LOW (ref 3.87–5.11)
RDW: 13.5 % (ref 11.5–15.5)
WBC: 10.6 10*3/uL — ABNORMAL HIGH (ref 4.0–10.5)
nRBC: 0.3 % — ABNORMAL HIGH (ref 0.0–0.2)

## 2022-01-13 LAB — BASIC METABOLIC PANEL
Anion gap: 6 (ref 5–15)
BUN: 16 mg/dL (ref 6–20)
CO2: 23 mmol/L (ref 22–32)
Calcium: 9.7 mg/dL (ref 8.9–10.3)
Chloride: 104 mmol/L (ref 98–111)
Creatinine, Ser: 0.68 mg/dL (ref 0.44–1.00)
GFR, Estimated: >60 mL/min (ref >60)
Glucose, Bld: 94 mg/dL (ref 70–99)
Potassium: 4.7 mmol/L (ref 3.5–5.1)
Sodium: 133 mmol/L — ABNORMAL LOW (ref 135–145)

## 2022-01-13 MED ORDER — HYDROMORPHONE HCL 2 MG/ML IJ SOLN
2.0000 mg | INTRAMUSCULAR | Status: AC
  Administered 2022-01-13: 2 mg via INTRAVENOUS
  Filled 2022-01-13: qty 1

## 2022-01-13 MED ORDER — SODIUM CHLORIDE 0.45 % IV SOLN: INTRAVENOUS | Status: DC

## 2022-01-13 MED ORDER — ONDANSETRON HCL 4 MG/2ML IJ SOLN
4.0000 mg | INTRAMUSCULAR | Status: DC | PRN
  Administered 2022-01-13: 4 mg via INTRAVENOUS
  Filled 2022-01-13: qty 2

## 2022-01-13 MED ORDER — SODIUM CHLORIDE 0.9 % IV SOLN
12.5000 mg | Freq: Once | INTRAVENOUS | Status: AC
  Administered 2022-01-13: 12.5 mg via INTRAVENOUS
  Filled 2022-01-13: qty 12.5

## 2022-01-13 NOTE — ED Provider Notes (Signed)
?[facility] DEPT ?Provider Note ? ? ?CSN: [id number] ?Arrival date & time: 01/13/22  1056 ? ?  ? ?History ? ?Chief Complaint  ?Patient presents with  ? Sickle Cell Pain Crisis  ? ? ?Carmen [name] is a 31 y.o. female. ? ?31 year old female with history of sickle cell disease well-known to me presents with exacerbation of her pain crisis.  States that she has chronic dental pain and that this is exacerbated her disease.  Denies any fever or chills.  No cough or congestion.  No new dyspnea.  No vomiting or diarrhea.  Symptoms uncontrolled with her home medications. ? ? ?  ? ?Home Medications ?Prior to Admission medications   ?Medication Sig Start Date End Date Taking? Authorizing Provider  ?betamethasone valerate ointment (VALISONE) 0.1 % APPLY TO AFFECTED AREA TWICE A DAY ?Strength: 0.1 % ?Patient taking differently: 1 application daily. 12/23/21   Vevelyn Francois, NP  ?citalopram (CELEXA) 20 MG tablet TAKE 1 TABLET BY MOUTH EVERY DAY ?Patient not taking: Reported on 12/31/2021 02/24/21   [name], FNP  ?cyclobenzaprine (FLEXERIL) 10 MG tablet TAKE 1 TABLET BY MOUTH TWICE A DAY ?Patient not taking: Reported on 12/31/2021 10/12/21   Vevelyn Francois, NP  ?DULoxetine (CYMBALTA) 30 MG capsule Take 2 capsules (60 mg total) by mouth daily. ?Patient taking differently: Take 60 mg by mouth daily as needed (anxiety). 10/21/21 04/19/22  Vevelyn Francois, NP  ?folic acid (FOLVITE) 1 MG tablet Take 1 tablet (1 mg total) by mouth daily. 03/15/19   Lanae Boast, [place]  ?morphine (MS CONTIN) 60 MG 12 hr tablet Take 1 tablet (60 mg total) by mouth every 12 (twelve) hours. 01/03/22 02/02/22  Vevelyn Francois, NP  ?Oxycodone HCl 10 MG TABS Take 1 tablet (10 mg total) by mouth every 6 (six) hours as needed for up to 15 days (pain). 01/19/22 02/03/22  Vevelyn Francois, NP  ?Vitamin D, Ergocalciferol, (DRISDOL) 1.25 MG (50000 UNIT) CAPS capsule TAKE ONE CAPSULE BY MOUTH ONE TIME PER WEEK ?Patient taking differently:  Take 50,000 Units by mouth every 7 (seven) days. Wednesdays 09/03/20   Vevelyn Francois, NP  ?   ? ?Allergies    ?Ketamine   ? ?Review of Systems   ?Review of Systems  ?All other systems reviewed and are negative. ? ?Physical Exam ?Updated Vital Signs ?BP 117/76 (BP Location: Left Arm)   Pulse 94   Temp 98.2 ?F (36.8 ?C) (Oral)   Resp 18   SpO2 100%  ?Physical Exam ?Vitals and nursing note reviewed.  ?Constitutional:   ?   General: She is not in acute distress. ?   Appearance: Normal appearance. She is well-developed. She is not toxic-appearing.  ?HENT:  ?   Head: Normocephalic and atraumatic.  ?   Mouth/Throat:  ?   Dentition: Dental caries present.  ?Eyes:  ?   General: Lids are normal.  ?   Conjunctiva/sclera: Conjunctivae normal.  ?   Pupils: Pupils are equal, round, and reactive to light.  ?Neck:  ?   Thyroid: No thyroid mass.  ?   Trachea: No tracheal deviation.  ?Cardiovascular:  ?   Rate and Rhythm: Normal rate and regular rhythm.  ?   Heart sounds: Normal heart sounds. No murmur heard. ?  No gallop.  ?Pulmonary:  ?   Effort: Pulmonary effort is normal. No respiratory distress.  ?   Breath sounds: Normal breath sounds. No stridor. No decreased breath sounds, wheezing, rhonchi or  rales.  ?Abdominal:  ?   General: There is no distension.  ?   Palpations: Abdomen is soft.  ?   Tenderness: There is no abdominal tenderness. There is no rebound.  ?Musculoskeletal:     ?   General: No tenderness. Normal range of motion.  ?   Cervical back: Normal range of motion and neck supple.  ?Skin: ?   General: Skin is warm and dry.  ?   Findings: No abrasion or rash.  ?Neurological:  ?   Mental Status: She is alert and oriented to person, place, and time. Mental status is at baseline.  ?   GCS: GCS eye subscore is 4. GCS verbal subscore is 5. GCS motor subscore is 6.  ?   Cranial Nerves: No cranial nerve deficit.  ?   Sensory: No sensory deficit.  ?   Motor: Motor function is intact.  ?Psychiatric:     ?   Attention and  Perception: Attention normal.     ?   Speech: Speech normal.     ?   Behavior: Behavior normal.  ? ? ?ED Results / Procedures / Treatments   ?Labs ?(all labs ordered are listed, but only abnormal results are displayed) ?Labs Reviewed - No data to display ? ?EKG ?None ? ?Radiology ?No results found. ? ?Procedures ?Procedures  ? ? ?Medications Ordered in ED ?Medications  ?0.45 % sodium chloride infusion (has no administration in time range)  ?HYDROmorphone (DILAUDID) injection 2 mg (has no administration in time range)  ?HYDROmorphone (DILAUDID) injection 2 mg (has no administration in time range)  ?HYDROmorphone (DILAUDID) injection 2 mg (has no administration in time range)  ?diphenhydrAMINE (BENADRYL) 12.5 mg in sodium chloride 0.9 % 50 mL IVPB (has no administration in time range)  ?ondansetron (ZOFRAN) injection 4 mg (has no administration in time range)  ? ? ?ED Course/ Medical Decision Making/ A&P ?  ?                        ?Medical Decision Making ?Amount and/or Complexity of Data Reviewed ?Labs: ordered. ? ?Risk ?Prescription drug management. ? ? ?Patient here presenting with sickle cell crisis.  Considered acute chest syndrome but do not feel that is likely given her current presentations.  Further this is likely chronic pain versus sickle cell.  Laboratory studies reviewed and she has no signs of worsening anemia.  She was medicated with 2 doses of IV hydromorphone and reassess and feels better.  Patient will follow-up with her doctor as needed.  No indication for admission at this time ? ? ? ? ? ? ? ?Final Clinical Impression(s) / ED Diagnoses ?Final diagnoses:  ?None  ? ? ?Rx / DC Orders ?ED Discharge Orders   ? ? None  ? ?  ? ? ?  ?Lacretia Leigh, MD ?01/13/22 1418 ? ?

## 2022-01-13 NOTE — ED Triage Notes (Signed)
Pt arrived via POV, c/o SCC. Pain started on entire left side of body. States started with dental pain, but pain now throughout entire side. States she has been unable to control pain at home. No medication this morning.  ?

## 2022-01-13 NOTE — Progress Notes (Deleted)
   [facility] [address] [place], [place]  [address] Phone:  [phone]   Fax:  [phone] 

## 2022-01-15 MED ORDER — MORPHINE SULFATE ER 60 MG PO TBCR: 60.0000 mg | EXTENDED_RELEASE_TABLET | Freq: Two times a day (BID) | ORAL | 0 refills | Status: AC

## 2022-02-01 ENCOUNTER — Other Ambulatory Visit: Payer: Self-pay

## 2022-02-01 ENCOUNTER — Inpatient Hospital Stay (HOSPITAL_COMMUNITY)
Admission: EM | Admit: 2022-02-01 | Discharge: 2022-02-04 | DRG: 812 | Disposition: A | Discharge status: Home / Self Care | Payer: Medicaid Other | Attending: Internal Medicine | Admitting: Internal Medicine

## 2022-02-01 DIAGNOSIS — D5700 Hb-SS disease with crisis, unspecified: Principal | ICD-10-CM | POA: Diagnosis present

## 2022-02-01 DIAGNOSIS — M7918 Myalgia, other site: Secondary | ICD-10-CM | POA: Diagnosis present

## 2022-02-01 DIAGNOSIS — D638 Anemia in other chronic diseases classified elsewhere: Secondary | ICD-10-CM | POA: Diagnosis present

## 2022-02-01 DIAGNOSIS — F1120 Opioid dependence, uncomplicated: Secondary | ICD-10-CM | POA: Diagnosis present

## 2022-02-01 DIAGNOSIS — G894 Chronic pain syndrome: Secondary | ICD-10-CM | POA: Diagnosis present

## 2022-02-01 DIAGNOSIS — Z832 Family history of diseases of the blood and blood-forming organs and certain disorders involving the immune mechanism: Secondary | ICD-10-CM | POA: Diagnosis not present

## 2022-02-01 DIAGNOSIS — Z888 Allergy status to other drugs, medicaments and biological substances status: Secondary | ICD-10-CM

## 2022-02-01 DIAGNOSIS — Z79899 Other long term (current) drug therapy: Secondary | ICD-10-CM

## 2022-02-01 DIAGNOSIS — F329 Major depressive disorder, single episode, unspecified: Secondary | ICD-10-CM | POA: Diagnosis present

## 2022-02-01 DIAGNOSIS — G8929 Other chronic pain: Secondary | ICD-10-CM | POA: Diagnosis present

## 2022-02-01 DIAGNOSIS — D571 Sickle-cell disease without crisis: Secondary | ICD-10-CM

## 2022-02-01 LAB — CBC WITH DIFFERENTIAL/PLATELET
Abs Immature Granulocytes: 0.02 10*3/uL (ref 0.00–0.07)
Basophils Absolute: 0 10*3/uL (ref 0.0–0.1)
Basophils Relative: 1 %
Eosinophils Absolute: 0.2 10*3/uL (ref 0.0–0.5)
Eosinophils Relative: 3 %
HCT: 33.1 % — ABNORMAL LOW (ref 36.0–46.0)
Hemoglobin: 11.7 g/dL — ABNORMAL LOW (ref 12.0–15.0)
Immature Granulocytes: 0 %
Lymphocytes Relative: 29 %
Lymphs Abs: 2.4 10*3/uL (ref 0.7–4.0)
MCH: 30 pg (ref 26.0–34.0)
MCHC: 35.3 g/dL (ref 30.0–36.0)
MCV: 84.9 fL (ref 80.0–100.0)
Monocytes Absolute: 0.8 10*3/uL (ref 0.1–1.0)
Monocytes Relative: 10 %
Neutro Abs: 4.7 10*3/uL (ref 1.7–7.7)
Neutrophils Relative %: 57 %
Platelets: 541 10*3/uL — ABNORMAL HIGH (ref 150–400)
RBC: 3.9 MIL/uL (ref 3.87–5.11)
RDW: 13.1 % (ref 11.5–15.5)
WBC: 8.1 10*3/uL (ref 4.0–10.5)
nRBC: 0.2 % (ref 0.0–0.2)

## 2022-02-01 LAB — URINALYSIS, ROUTINE W REFLEX MICROSCOPIC
Bilirubin Urine: NEGATIVE
Glucose, UA: NEGATIVE mg/dL
Ketones, ur: NEGATIVE mg/dL
Nitrite: NEGATIVE
Protein, ur: NEGATIVE mg/dL
Specific Gravity, Urine: 1.016 (ref 1.005–1.030)
pH: 5 (ref 5.0–8.0)

## 2022-02-01 LAB — COMPREHENSIVE METABOLIC PANEL
ALT: 31 U/L (ref 0–44)
AST: 28 U/L (ref 15–41)
Albumin: 4.4 g/dL (ref 3.5–5.0)
Alkaline Phosphatase: 67 U/L (ref 38–126)
Anion gap: 6 (ref 5–15)
BUN: 10 mg/dL (ref 6–20)
CO2: 26 mmol/L (ref 22–32)
Calcium: 9.4 mg/dL (ref 8.9–10.3)
Chloride: 105 mmol/L (ref 98–111)
Creatinine, Ser: 0.71 mg/dL (ref 0.44–1.00)
GFR, Estimated: >60 mL/min (ref >60)
Glucose, Bld: 103 mg/dL — ABNORMAL HIGH (ref 70–99)
Potassium: 4 mmol/L (ref 3.5–5.1)
Sodium: 137 mmol/L (ref 135–145)
Total Bilirubin: 0.8 mg/dL (ref 0.3–1.2)
Total Protein: 8.3 g/dL — ABNORMAL HIGH (ref 6.5–8.1)

## 2022-02-01 LAB — RETICULOCYTES
Immature Retic Fract: 33.5 % — ABNORMAL HIGH (ref 2.3–15.9)
RBC.: 3.84 MIL/uL — ABNORMAL LOW (ref 3.87–5.11)
Retic Count, Absolute: 156.7 10*3/uL (ref 19.0–186.0)
Retic Ct Pct: 4.1 % — ABNORMAL HIGH (ref 0.4–3.1)

## 2022-02-01 LAB — I-STAT BETA HCG BLOOD, ED (MC, WL, AP ONLY): I-stat hCG, quantitative: <5 m[IU]/mL (ref <5)

## 2022-02-01 MED ORDER — DULOXETINE HCL 30 MG PO CPEP
60.0000 mg | ORAL_CAPSULE | Freq: Every day | ORAL | Status: DC
  Administered 2022-02-01 – 2022-02-04 (×4): 60 mg via ORAL
  Filled 2022-02-01 (×4): qty 2

## 2022-02-01 MED ORDER — HYDROMORPHONE HCL 2 MG/ML IJ SOLN
2.0000 mg | INTRAMUSCULAR | Status: AC
  Administered 2022-02-01: 2 mg via INTRAVENOUS
  Filled 2022-02-01: qty 1

## 2022-02-01 MED ORDER — DIPHENHYDRAMINE HCL 25 MG PO CAPS: 25.0000 mg | ORAL_CAPSULE | ORAL | Status: DC | PRN

## 2022-02-01 MED ORDER — FOLIC ACID 1 MG PO TABS
1.0000 mg | ORAL_TABLET | Freq: Every day | ORAL | Status: DC
  Administered 2022-02-02 – 2022-02-04 (×3): 1 mg via ORAL
  Filled 2022-02-01 (×3): qty 1

## 2022-02-01 MED ORDER — MORPHINE SULFATE ER 30 MG PO TBCR
60.0000 mg | EXTENDED_RELEASE_TABLET | Freq: Two times a day (BID) | ORAL | Status: DC
  Administered 2022-02-01 – 2022-02-04 (×6): 60 mg via ORAL
  Filled 2022-02-01 (×7): qty 2

## 2022-02-01 MED ORDER — CYCLOBENZAPRINE HCL 10 MG PO TABS
10.0000 mg | ORAL_TABLET | Freq: Two times a day (BID) | ORAL | Status: DC
  Administered 2022-02-01 – 2022-02-04 (×6): 10 mg via ORAL
  Filled 2022-02-01 (×6): qty 1

## 2022-02-01 MED ORDER — HYDROMORPHONE HCL 1 MG/ML IJ SOLN: 1.0000 mg | INTRAMUSCULAR | Status: DC | PRN

## 2022-02-01 MED ORDER — POLYETHYLENE GLYCOL 3350 17 G PO PACK: 17.0000 g | PACK | Freq: Every day | ORAL | Status: DC | PRN

## 2022-02-01 MED ORDER — SODIUM CHLORIDE 0.9% FLUSH: 9.0000 mL | INTRAVENOUS | Status: DC | PRN

## 2022-02-01 MED ORDER — ENOXAPARIN SODIUM 40 MG/0.4ML IJ SOSY
40.0000 mg | PREFILLED_SYRINGE | INTRAMUSCULAR | Status: DC
  Filled 2022-02-01: qty 0.4

## 2022-02-01 MED ORDER — HYDROMORPHONE 1 MG/ML IV SOLN
INTRAVENOUS | Status: DC
  Administered 2022-02-01: 30 mg via INTRAVENOUS
  Administered 2022-02-02: 1.8 mg via INTRAVENOUS
  Administered 2022-02-02: 30 mg via INTRAVENOUS
  Administered 2022-02-02: 4.2 mg via INTRAVENOUS
  Administered 2022-02-03: 7.2 mg via INTRAVENOUS
  Filled 2022-02-01 (×3): qty 30

## 2022-02-01 MED ORDER — CITALOPRAM HYDROBROMIDE 20 MG PO TABS
20.0000 mg | ORAL_TABLET | Freq: Every day | ORAL | Status: DC
  Administered 2022-02-01 – 2022-02-04 (×4): 20 mg via ORAL
  Filled 2022-02-01 (×4): qty 1

## 2022-02-01 MED ORDER — NALOXONE HCL 0.4 MG/ML IJ SOLN: 0.4000 mg | INTRAMUSCULAR | Status: DC | PRN

## 2022-02-01 MED ORDER — DIPHENHYDRAMINE HCL 25 MG PO CAPS
25.0000 mg | ORAL_CAPSULE | ORAL | Status: DC | PRN
  Administered 2022-02-01: 50 mg via ORAL
  Filled 2022-02-01: qty 2

## 2022-02-01 MED ORDER — KETOROLAC TROMETHAMINE 15 MG/ML IJ SOLN
15.0000 mg | Freq: Once | INTRAMUSCULAR | Status: AC
  Administered 2022-02-01: 15 mg via INTRAVENOUS
  Filled 2022-02-01: qty 1

## 2022-02-01 MED ORDER — SENNOSIDES-DOCUSATE SODIUM 8.6-50 MG PO TABS
1.0000 | ORAL_TABLET | Freq: Two times a day (BID) | ORAL | Status: DC
  Administered 2022-02-01 – 2022-02-04 (×6): 1 via ORAL
  Filled 2022-02-01 (×6): qty 1

## 2022-02-01 MED ORDER — ACETAMINOPHEN 500 MG PO TABS
1000.0000 mg | ORAL_TABLET | Freq: Once | ORAL | Status: AC
  Administered 2022-02-01: 1000 mg via ORAL
  Filled 2022-02-01: qty 2

## 2022-02-01 MED ORDER — SODIUM CHLORIDE 0.45 % IV SOLN: INTRAVENOUS | Status: DC

## 2022-02-01 MED ORDER — KETOROLAC TROMETHAMINE 15 MG/ML IJ SOLN
15.0000 mg | Freq: Four times a day (QID) | INTRAMUSCULAR | Status: DC
  Administered 2022-02-01 – 2022-02-04 (×11): 15 mg via INTRAVENOUS
  Filled 2022-02-01 (×11): qty 1

## 2022-02-01 MED ORDER — ONDANSETRON HCL 4 MG/2ML IJ SOLN
4.0000 mg | INTRAMUSCULAR | Status: DC | PRN
  Administered 2022-02-01 – 2022-02-02 (×5): 4 mg via INTRAVENOUS
  Filled 2022-02-01 (×6): qty 2

## 2022-02-01 NOTE — ED Triage Notes (Signed)
Pt states she is having SCC. Reports pain in her back, lower legs, and shoulders for the past 5 days. ?

## 2022-02-01 NOTE — H&P (Signed)
H&P ? Patient Demographics:  ?Carmen [name], is a 31 y.o. female  MRN: [id number]   DOB - 1990-12-12 ? ?Admit Date - 02/01/2022 ? ?Outpatient Primary MD for the patient is Pcp, No ? ?Chief Complaint  ?Patient presents with  ? Sickle Cell Pain Crisis  ?  ? ? HPI:  ? ?Carmen [name]  is a 31 y.o. female with a medical history significant for sickle cell disease, chronic pain syndrome, opiate dependence and tolerance, history of anemia of chronic disease, and major depressive disorder presented to the emergency department with a 4-day history of low back and lower extremity pain that is consistent with her previous sickle cell pain crisis.  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  She has been taking MS Contin and oxycodone as prescribed without sustained relief.  Her current pain intensity is 9/10 primarily to low back, bilateral hips, and lower extremities.  No fever, chills, chest pain, or shortness of breath.  No urinary symptoms, nausea, vomiting, or diarrhea. ? ?ER course: ?While in the ER, temperature 98.4 ?F, BP 124/86, pulse rate 71, respirations 14, and oxygen saturation 98% on room air.  Both complete metabolic panel and CBC with differential unremarkable.  Patient's pain persists despite IV Dilaudid, IV Toradol, and IV fluids.  Patient admitted to [facility] for further management of sickle cell pain crisis. ? ? Review of systems:  ?In addition to the HPI above, patient reports ?No fever or chills ?No Headache, No changes with vision or hearing ?No problems swallowing food or liquids ?No chest pain, cough or shortness of breath ?No abdominal pain, No nausea or vomiting, Bowel movements are regular ?No blood in stool or urine ?No dysuria ?No new skin rashes or bruises ?No new joints pains-aches ?No new weakness, tingling, numbness in any extremity ?No recent weight gain or loss ?No polyuria, polydypsia or polyphagia ?No significant Mental Stressors ? ? ?With Past  History of the following :  ? ?Past Medical History:  ?Diagnosis Date  ? Anxiety   ? Headache(784.0)   ? Heart murmur   ? Sickle cell crisis ([place])   ? Syphilis 2015  ? Was diagnosed and received one injection of antibiotics  ? Thrombocytosis 11/22/2014  ?   CBC Latest Ref Rng & Units 12/13/2018 12/11/2018 12/10/2018 WBC 4.0 - 10.5 K/uL 14.8(H) 13.2(H) 15.9(H) Hemoglobin 12.0 - 15.0 g/dL 8.2(L) 7.7(L) 8.1(L) Hematocrit 36.0 - 46.0 % 23.7(L) 23.2(L) 24.5(L) Platelets 150 - 400 K/uL 326 399 388    ?   ? ?Past Surgical History:  ?Procedure Laterality Date  ? CESAREAN SECTION N/A 02/05/2019  ? Procedure: CESAREAN SECTION;  Surgeon: [name], MD;  Location: [facility] LD ORS;  Service: Obstetrics;  Laterality: N/A;  ? CHOLECYSTECTOMY N/A 11/30/2014  ? Procedure: LAPAROSCOPIC CHOLECYSTECTOMY SINGLE SITE WITH INTRAOPERATIVE CHOLANGIOGRAM;  Surgeon: Michael Boston, MD;  Location: [facility] ORS;  Service: General;  Laterality: N/A;  ? SPLENECTOMY    ? ? ? Social History:  ? ?Social History  ? ?Tobacco Use  ? Smoking status: Some Days  ?  Types: Cigarettes  ? Smokeless tobacco: Never  ?Substance Use Topics  ? Alcohol use: No  ?  ? ?Lives - At home ? ? Family History :  ? ?Family History  ?Problem Relation Age of Onset  ? Hypertension Mother   ? Sickle cell anemia Sister   ? Kidney disease Sister   ?     Lupus  ? Arthritis Sister   ?  Sickle cell anemia Sister   ? Sickle cell trait Sister   ? Heart disease Maternal Aunt   ?     CABG  ? Heart disease Maternal Uncle   ?     CABG  ? Lupus Sister   ? ? ? Home Medications:  ? ?Prior to Admission medications   ?Medication Sig Start Date End Date Taking? Authorizing Provider  ?acetaminophen (TYLENOL) 500 MG tablet Take 1,000 mg by mouth daily as needed (pain).   Yes [provider]  ?betamethasone valerate ointment (VALISONE) 0.1 % APPLY TO AFFECTED AREA TWICE A DAY ?Strength: 0.1 % ?Patient taking differently: 1 application. 2 (two) times daily as needed (breakouts/rash). 12/23/21  Yes Vevelyn Francois, NP  ?citalopram (CELEXA) 20 MG tablet TAKE 1 TABLET BY MOUTH EVERY DAY ?Patient taking differently: Take 20 mg by mouth at bedtime as needed (anxiety). 02/24/21  Yes [name], FNP  ?cyclobenzaprine (FLEXERIL) 10 MG tablet TAKE 1 TABLET BY MOUTH TWICE A DAY ?Patient taking differently: Take 10 mg by mouth daily as needed for muscle spasms. 10/12/21  Yes Vevelyn Francois, NP  ?DULoxetine (CYMBALTA) 30 MG capsule Take 2 capsules (60 mg total) by mouth daily. ?Patient taking differently: Take 30 mg by mouth daily as needed (muscle pain). 10/21/21 04/19/22 Yes Vevelyn Francois, NP  ?folic acid (FOLVITE) 1 MG tablet Take 1 tablet (1 mg total) by mouth daily. ?Patient taking differently: Take 1 mg by mouth every morning. 03/15/19  Yes Lanae Boast, [place]  ?morphine (MS CONTIN) 60 MG 12 hr tablet Take 60 mg by mouth 2 (two) times daily as needed for pain. 01/25/22  Yes [provider]  ?Oxycodone HCl 10 MG TABS Take 1 tablet (10 mg total) by mouth every 6 (six) hours as needed for up to 15 days (pain). 01/19/22 02/03/22 Yes Vevelyn Francois, NP  ?Vitamin D, Ergocalciferol, (DRISDOL) 1.25 MG (50000 UNIT) CAPS capsule TAKE ONE CAPSULE BY MOUTH ONE TIME PER WEEK ?Patient taking differently: Take 50,000 Units by mouth every Wednesday. 09/03/20  Yes Vevelyn Francois, NP  ? ? ? Allergies:  ? ?Allergies  ?Allergen Reactions  ? Ketamine Hives and Other (See Comments)  ?  Makes feel funny  ? ? ? Physical Exam:  ? ?Vitals:  ? ?Vitals:  ? 02/01/22 2018 02/01/22 2120  ?BP:  113/79  ?Pulse:  65  ?Resp: 13 16  ?Temp:  98.6 ?F (37 ?C)  ?SpO2: 99% 100%  ? ? ?Physical Exam: ?Constitutional: Patient appears well-developed and well-nourished. Not in obvious distress. ?HENT: Normocephalic, atraumatic, External right and left ear normal. Oropharynx is clear and moist.  ?Eyes: Conjunctivae and EOM are normal. PERRLA, no scleral icterus. ?Neck: Normal ROM. Neck supple. No JVD. No tracheal deviation. No thyromegaly. ?CVS: RRR,  S1/S2 +, no murmurs, no gallops, no carotid bruit.  ?Pulmonary: Effort and breath sounds normal, no stridor, rhonchi, wheezes, rales.  ?Abdominal: Soft. BS +, no distension, tenderness, rebound or guarding.  ?Musculoskeletal: Normal range of motion. No edema and no tenderness.  ?Lymphadenopathy: No lymphadenopathy noted, cervical, inguinal or axillary ?Neuro: Alert. Normal reflexes, muscle tone coordination. No cranial nerve deficit. ?Skin: Skin is warm and dry. No rash noted. Not diaphoretic. No erythema. No pallor. ?Psychiatric: Normal mood and affect. Behavior, judgment, thought content normal. ? ? Data Review:  ? ?CBC ?Recent Labs  ?Lab 02/01/22 ?1142  ?WBC 8.1  ?HGB 11.7*  ?HCT 33.1*  ?PLT 541*  ?MCV 84.9  ?MCH 30.0  ?  MCHC 35.3  ?RDW 13.1  ?LYMPHSABS 2.4  ?MONOABS 0.8  ?EOSABS 0.2  ?BASOSABS 0.0  ? ?------------------------------------------------------------------------------------------------------------------ ? ?Chemistries  ?Recent Labs  ?Lab 02/01/22 ?1142  ?NA 137  ?K 4.0  ?CL 105  ?CO2 26  ?GLUCOSE 103*  ?BUN 10  ?CREATININE 0.71  ?CALCIUM 9.4  ?AST 28  ?ALT 31  ?ALKPHOS 67  ?BILITOT 0.8  ? ?------------------------------------------------------------------------------------------------------------------ ?estimated creatinine clearance is 106.5 mL/min (by C-G formula based on SCr of 0.71 mg/dL). ?------------------------------------------------------------------------------------------------------------------ ?No results for input(s): TSH, T4TOTAL, T3FREE, THYROIDAB in the last 72 hours. ? ?Invalid input(s): FREET3 ? ?Coagulation profile ?No results for input(s): INR, PROTIME in the last 168 hours. ?------------------------------------------------------------------------------------------------------------------- ?No results for input(s): DDIMER in the last 72 hours. ?------------------------------------------------------------------------------------------------------------------- ? ?Cardiac Enzymes ?No  results for input(s): CKMB, TROPONINI, MYOGLOBIN in the last 168 hours. ? ?Invalid input(s): CK ?----------------------------------------------------------------------------------------------------------------

## 2022-02-01 NOTE — ED Provider Notes (Signed)
?[facility] DEPT ?Provider Note ? ? ?CSN: [id number] ?Arrival date & time: 02/01/22  4742 ? ?  ? ?History ? ?Chief Complaint  ?Patient presents with  ? Sickle Cell Pain Crisis  ? ? ?Carmen [name] is a 31 y.o. female. ? ? ?Sickle Cell Pain Crisis ? 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 5 days of shoulders, back and legs pain that this feels similar to previous sickle cell pain crises A. tach.  She denies any nausea vomiting or diarrhea.  No fevers or abdominal pain ? ?No CP, SOB, fevers ? ? ? ?  ? ?Home Medications ?Prior to Admission medications   ?Medication Sig Start Date End Date Taking? Authorizing Provider  ?acetaminophen (TYLENOL) 500 MG tablet Take 1,000 mg by mouth daily as needed (pain).   Yes [provider]  ?betamethasone valerate ointment (VALISONE) 0.1 % APPLY TO AFFECTED AREA TWICE A DAY ?Strength: 0.1 % ?Patient taking differently: 1 application. 2 (two) times daily as needed (breakouts/rash). 12/23/21  Yes Vevelyn Francois, NP  ?citalopram (CELEXA) 20 MG tablet TAKE 1 TABLET BY MOUTH EVERY DAY ?Patient taking differently: Take 20 mg by mouth at bedtime as needed (anxiety). 02/24/21  Yes [name], FNP  ?cyclobenzaprine (FLEXERIL) 10 MG tablet TAKE 1 TABLET BY MOUTH TWICE A DAY ?Patient taking differently: Take 10 mg by mouth daily as needed for muscle spasms. 10/12/21  Yes Vevelyn Francois, NP  ?DULoxetine (CYMBALTA) 30 MG capsule Take 2 capsules (60 mg total) by mouth daily. ?Patient taking differently: Take 30 mg by mouth daily as needed (muscle pain). 10/21/21 04/19/22 Yes Vevelyn Francois, NP  ?folic acid (FOLVITE) 1 MG tablet Take 1 tablet (1 mg total) by mouth daily. ?Patient taking differently: Take 1 mg by mouth every morning. 03/15/19  Yes Lanae Boast, [place]  ?morphine (MS CONTIN) 60 MG 12 hr tablet Take 60 mg by mouth 2 (two) times daily as needed for pain. 01/25/22   Yes [provider]  ?Oxycodone HCl 10 MG TABS Take 1 tablet (10 mg total) by mouth every 6 (six) hours as needed for up to 15 days (pain). 01/19/22 02/03/22 Yes Vevelyn Francois, NP  ?Vitamin D, Ergocalciferol, (DRISDOL) 1.25 MG (50000 UNIT) CAPS capsule TAKE ONE CAPSULE BY MOUTH ONE TIME PER WEEK ?Patient taking differently: Take 50,000 Units by mouth every Wednesday. 09/03/20  Yes Vevelyn Francois, NP  ?   ? ?Allergies    ?Ketamine   ? ?Review of Systems   ?Review of Systems ? ?Physical Exam ?Updated Vital Signs ?BP 124/86   Pulse 71   Temp 98.7 ?F (37.1 ?C) (Oral)   Resp 14   Ht '5\' 3"'$  (1.6 m)   Wt 83.9 kg   LMP 01/10/2022 (Exact Date)   SpO2 98%   BMI 32.77 kg/m?  ?Physical Exam ?Vitals and nursing note reviewed.  ?Constitutional:   ?   Comments: Appears uncomfortable  ?HENT:  ?   Head: Normocephalic and atraumatic.  ?   Nose: Nose normal.  ?Eyes:  ?   General: No scleral icterus. ?Cardiovascular:  ?   Rate and Rhythm: Normal rate and regular rhythm.  ?   Pulses: Normal pulses.  ?   Heart sounds: Normal heart sounds.  ?Pulmonary:  ?   Effort: Pulmonary effort is normal. No respiratory distress.  ?   Breath sounds: No wheezing.  ?Abdominal:  ?  Palpations: Abdomen is soft.  ?   Tenderness: There is no abdominal tenderness. There is no guarding or rebound.  ?Musculoskeletal:  ?   Cervical back: Normal range of motion.  ?   Right lower leg: No edema.  ?   Left lower leg: No edema.  ?Skin: ?   General: Skin is warm and dry.  ?   Capillary Refill: Capillary refill takes less than 2 seconds.  ?Neurological:  ?   Mental Status: She is alert. Mental status is at baseline.  ?Psychiatric:     ?   Mood and Affect: Mood normal.     ?   Behavior: Behavior normal.  ? ? ?ED Results / Procedures / Treatments   ?Labs ?(all labs ordered are listed, but only abnormal results are displayed) ?Labs Reviewed  ?RETICULOCYTES - Abnormal; Notable for the following components:  ?    Result Value  ? Retic Ct Pct 4.1 (*)   ?  RBC. 3.84 (*)   ? Immature Retic Fract 33.5 (*)   ? All other components within normal limits  ?COMPREHENSIVE METABOLIC PANEL - Abnormal; Notable for the following components:  ? Glucose, Bld 103 (*)   ? Total Protein 8.3 (*)   ? All other components within normal limits  ?CBC WITH DIFFERENTIAL/PLATELET - Abnormal; Notable for the following components:  ? Hemoglobin 11.7 (*)   ? HCT 33.1 (*)   ? Platelets 541 (*)   ? All other components within normal limits  ?URINALYSIS, ROUTINE W REFLEX MICROSCOPIC  ?I-STAT BETA HCG BLOOD, ED ([facility], [facility], [facility] ONLY)  ? ? ?EKG ?None ? ?Radiology ?No results found. ? ?Procedures ?Procedures  ? ? ?Medications Ordered in ED ?Medications  ?ondansetron (ZOFRAN) injection 4 mg (4 mg Intravenous Given 02/01/22 1128)  ?diphenhydrAMINE (BENADRYL) capsule 25-50 mg (50 mg Oral Given 02/01/22 1126)  ?HYDROmorphone (DILAUDID) injection 2 mg (2 mg Intravenous Given 02/01/22 1132)  ?HYDROmorphone (DILAUDID) injection 2 mg (2 mg Intravenous Given 02/01/22 1225)  ?HYDROmorphone (DILAUDID) injection 2 mg (2 mg Intravenous Given 02/01/22 1319)  ?acetaminophen (TYLENOL) tablet 1,000 mg (1,000 mg Oral Given 02/01/22 1127)  ?ketorolac (TORADOL) 15 MG/ML injection 15 mg (15 mg Intravenous Given 02/01/22 1317)  ? ? ?ED Course/ Medical Decision Making/ A&P ?Clinical Course as of 02/01/22 1513  ?Mon Feb 01, 2022  ?1111 Discussed with [place] Hollis -- informs me that she can see pt tomorrow at 8am for continued treatment.  [WF]  ?  ?Clinical Course User Index ?[WF] Tedd Sias, [place]  ? ?                        ?Medical Decision Making ?Amount and/or Complexity of Data Reviewed ?Labs: ordered. ? ?Risk ?OTC drugs. ?Prescription drug management. ?Decision regarding hospitalization. ? ? ? 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 5 days of shoulders, back and legs pain that this feels similar to previous sickle cell pain crises A. tach.  She denies  any nausea vomiting or diarrhea.  No fevers or abdominal pain ? ?No CP, SOB, fevers ? ?Labs are at baseline.  No elevation in bilirubin.  I-STAT hCG negative for pregnancy. ? ?Patient was received 3 doses of Dilaudid and has received Toradol Tylenol Zofran and Benadryl. ? ?Continues to endorse 9/10 pain with only 10% improvement in symptoms.  Is requesting admission.  Will discuss with inpatient sickle  cell team for admission.  ? ? ?Final Clinical Impression(s) / ED Diagnoses ?Final diagnoses:  ?Sickle cell crisis ([place])  ? ? ?Rx / DC Orders ?ED Discharge Orders   ? ? None  ? ?  ? ? ?  ?Tedd Sias, [place] ?02/01/22 1514 ? ?  ?Blanchie Dessert, MD ?02/04/22 [id number] ? ?

## 2022-02-01 NOTE — ED Notes (Signed)
Pt aware urine sample needed 

## 2022-02-02 ENCOUNTER — Encounter (HOSPITAL_COMMUNITY): Payer: Self-pay | Admitting: Family Medicine

## 2022-02-02 DIAGNOSIS — D5700 Hb-SS disease with crisis, unspecified: Secondary | ICD-10-CM | POA: Diagnosis not present

## 2022-02-02 LAB — BASIC METABOLIC PANEL
Anion gap: 7 (ref 5–15)
BUN: 13 mg/dL (ref 6–20)
CO2: 24 mmol/L (ref 22–32)
Calcium: 9.2 mg/dL (ref 8.9–10.3)
Chloride: 105 mmol/L (ref 98–111)
Creatinine, Ser: 0.67 mg/dL (ref 0.44–1.00)
GFR, Estimated: >60 mL/min (ref >60)
Glucose, Bld: 103 mg/dL — ABNORMAL HIGH (ref 70–99)
Potassium: 3.8 mmol/L (ref 3.5–5.1)
Sodium: 136 mmol/L (ref 135–145)

## 2022-02-02 LAB — CBC
HCT: 29.8 % — ABNORMAL LOW (ref 36.0–46.0)
Hemoglobin: 10.7 g/dL — ABNORMAL LOW (ref 12.0–15.0)
MCH: 30.1 pg (ref 26.0–34.0)
MCHC: 35.9 g/dL (ref 30.0–36.0)
MCV: 83.9 fL (ref 80.0–100.0)
Platelets: 376 10*3/uL (ref 150–400)
RBC: 3.55 MIL/uL — ABNORMAL LOW (ref 3.87–5.11)
RDW: 13.2 % (ref 11.5–15.5)
WBC: 12.6 10*3/uL — ABNORMAL HIGH (ref 4.0–10.5)
nRBC: 0 % (ref 0.0–0.2)

## 2022-02-02 NOTE — Progress Notes (Signed)
Received a call from charge RN after smelling cigarette smoke in 1510. This Probation officer explained to patient that all [facility] hospitals are non smoking facilities, and also explained the dangers of smoking while on oxygen.  Patient denied smoking and became extremely hostile with myself and charge RN.  Patient stated that she was aware of hospital policy.  Patient refused to have belongings searched.  Patient advised that patient/staff safety are our number one priority.  If further smoking suspected a tele-monitor would be placed in the room.   ?

## 2022-02-02 NOTE — Progress Notes (Signed)
While rounding on patients at the far end of the hallway and strong smell of cigarette smoke was detected.The stairwell and rooms 8 and 9 were checked and both patients were asleep. The smell of smoke was coming from 1510. The primary nurse was made aware immediately. ?

## 2022-02-02 NOTE — Progress Notes (Signed)
I was notified by a nurse that room 1510 had a strong smell of cigarette smoke. I went into the patient's room and the smell of smoke was there, I then found the patient sitting in the bathroom with the water running in the dark and the smell of cigarette smoke was very strong in there. I asked the patient if she was smoking and she said no. I notified Lita Mains and she notified the [facility] and security.  ?

## 2022-02-02 NOTE — Progress Notes (Signed)
After smelling cigarette smoke in room by RN, pt was visited by [facility] and advised that smoking cigarettes/ vapes is not allowed in the hospital. Pt denies use of cigarettes or vape in hospital room. (Vape in room, pt says it is not charged and proceeded to show the [facility] and the charge RN that it has no vapor.) ? ?Pt advised that if smoking is suspected again, a tele-monitor will need to be placed in room and MD will be notified. Pt states she understands, although pt's behavior became confrontational and accusatory. AC asked again if pt understood the policy. Pt. said yes. Pt refuses a nicotine patch at this time. ?

## 2022-02-02 NOTE — Plan of Care (Signed)

## 2022-02-02 NOTE — Progress Notes (Signed)
Subjective: ?Carmen [name] is a 31 year old female with a medical history significant for sickle cell disease, chronic pain syndrome, opiate dependence and tolerance, history of major depression, and history of anemia of chronic disease was admitted for sickle cell pain crisis. ? ?Patient is complaining of worsening pain to low back this morning.  She is very tearful and rates her pain as 8/10.  She denies any headache, chest pain, urinary symptoms, nausea, vomiting, or diarrhea. ? ?Objective: ? ?Vital signs in last 24 hours: ? ?Vitals:  ? 02/02/22 9628 02/02/22 1156 02/02/22 1438 02/02/22 1751  ?BP: 120/60  (!) 110/45   ?Pulse: 86  82   ?Resp: '16 17 19 18  '$ ?Temp: 98.6 ?F (37 ?C)  97.9 ?F (36.6 ?C)   ?TempSrc: Oral  Oral   ?SpO2: 95% 94% 97% 93%  ?Weight:      ?Height:      ? ? ?Intake/Output from previous day: ? ? ?Intake/Output Summary (Last 24 hours) at 02/02/2022 1820 ?Last data filed at 02/02/2022 1300 ?Gross per 24 hour  ?Intake 1501.54 ml  ?Output --  ?Net 1501.54 ml  ? ? ?Physical Exam: ?General: Alert, awake, oriented x3, in no acute distress.  ?HEENT: [place]/AT PEERL, EOMI ?Neck: Trachea midline,  no masses, no thyromegal,y no JVD, no carotid bruit ?OROPHARYNX:  Moist, No exudate/ erythema/lesions.  ?Heart: Regular rate and rhythm, without murmurs, rubs, gallops, PMI non-displaced, no heaves or thrills on palpation.  ?Lungs: Clear to auscultation, no wheezing or rhonchi noted. No increased vocal fremitus resonant to percussion  ?Abdomen: Soft, nontender, nondistended, positive bowel sounds, no masses no hepatosplenomegaly noted.Marland Kitchen  ?Neuro: No focal neurological deficits noted cranial nerves II through XII grossly intact. DTRs 2+ bilaterally upper and lower extremities. Strength 5 out of 5 in bilateral upper and lower extremities. ?Musculoskeletal: No warm swelling or erythema around joints, no spinal tenderness noted. ?Psychiatric: Patient alert and oriented x3, good insight and cognition, good recent to remote  recall. ?Lymph node survey: No cervical axillary or inguinal lymphadenopathy noted. ? ?Lab Results: ? ?Basic Metabolic Panel: ?   ?Component Value Date/Time  ? NA 136 02/02/2022 0915  ? NA 142 07/02/2020 1040  ? K 3.8 02/02/2022 0915  ? CL 105 02/02/2022 0915  ? CO2 24 02/02/2022 0915  ? BUN 13 02/02/2022 0915  ? BUN 8 07/02/2020 1040  ? CREATININE 0.67 02/02/2022 0915  ? GLUCOSE 103 (H) 02/02/2022 0915  ? CALCIUM 9.2 02/02/2022 0915  ? ?CBC: ?   ?Component Value Date/Time  ? WBC 12.6 (H) 02/02/2022 0915  ? HGB 10.7 (L) 02/02/2022 0915  ? HGB 9.8 (L) 07/02/2020 1040  ? HCT 29.8 (L) 02/02/2022 0915  ? HCT 28.9 (L) 07/02/2020 1040  ? PLT 376 02/02/2022 0915  ? PLT 628 (H) 07/02/2020 1040  ? MCV 83.9 02/02/2022 0915  ? MCV 88 07/02/2020 1040  ? NEUTROABS 4.7 02/01/2022 1142  ? NEUTROABS 3.0 07/02/2020 1040  ? LYMPHSABS 2.4 02/01/2022 1142  ? LYMPHSABS 3.5 (H) 07/02/2020 1040  ? MONOABS 0.8 02/01/2022 1142  ? EOSABS 0.2 02/01/2022 1142  ? EOSABS 0.2 07/02/2020 1040  ? BASOSABS 0.0 02/01/2022 1142  ? BASOSABS 0.1 07/02/2020 1040  ? ? ?No results found for this or any previous visit (from the past 240 hour(s)). ? ?Studies/Results: ?No results found. ? ?Medications: ?Scheduled Meds: ? citalopram  20 mg Oral Daily  ? cyclobenzaprine  10 mg Oral BID  ? DULoxetine  60 mg Oral Daily  ? enoxaparin (LOVENOX) injection  40 mg Subcutaneous N36R  ? folic acid  1 mg Oral Daily  ? HYDROmorphone   Intravenous Q4H  ? ketorolac  15 mg Intravenous Q6H  ? morphine  60 mg Oral Q12H  ? senna-docusate  1 tablet Oral BID  ? ?Continuous Infusions: ? sodium chloride 75 mL/hr at 02/01/22 1831  ? ?PRN Meds:.diphenhydrAMINE, HYDROmorphone (DILAUDID) injection, naloxone **AND** sodium chloride flush, ondansetron, polyethylene glycol ? ?Consultants: ?none ? ?Procedures: ?none ? ?Antibiotics: ?none ? ?Assessment/Plan: ?Principal Problem: ?  Sickle cell pain crisis ([place]) ?Active Problems: ?  Anemia of chronic disease ?  Chronic musculoskeletal pain ?   Major depressive disorder ? ?Sickle cell disease with pain crisis: ?Continue IV Dilaudid PCA without any changes in settings ?Reduce IV fluids to [facility] ?Toradol 15 mg IV every 6 hours ?Monitor vital signs very closely, reevaluate pain scale regularly, and supplemental oxygen as needed ? ?Anemia of chronic disease: ?Patient's hemoglobin remained stable and consistent with her baseline.  There is no clinical indication for blood transfusions at this time.  Follow labs. ? ?Chronic pain syndrome: ?Continue home medications ? ?History of major depression: ?Stable.  Continue home medication.  No suicidal homicidal intentions today. ? ?Code Status: Full Code ?Family Communication: N/A ?Disposition Plan: Not yet ready for discharge ? ?[name]  APRN, MSN, FNP-C ?Patient [place] ?[facility] ?[address]  ?[place], [place] [address] ?[phone] ? ?If 7PM-7AM, please contact night-coverage. ? ?02/02/2022, 6:20 PM  LOS: 1 day   ?

## 2022-02-03 DIAGNOSIS — D5700 Hb-SS disease with crisis, unspecified: Secondary | ICD-10-CM | POA: Diagnosis not present

## 2022-02-03 MED ORDER — OXYCODONE HCL 5 MG PO TABS
10.0000 mg | ORAL_TABLET | ORAL | Status: DC | PRN
  Administered 2022-02-03 – 2022-02-04 (×5): 10 mg via ORAL
  Filled 2022-02-03 (×5): qty 2

## 2022-02-03 MED ORDER — HYDROMORPHONE 1 MG/ML IV SOLN
INTRAVENOUS | Status: DC
  Administered 2022-02-03: 6.5 mg via INTRAVENOUS
  Administered 2022-02-03: 4.7 mg via INTRAVENOUS
  Administered 2022-02-04: 5.5 mg via INTRAVENOUS
  Administered 2022-02-04: 6.5 mg via INTRAVENOUS
  Administered 2022-02-04: 10 mg via INTRAVENOUS
  Filled 2022-02-03 (×2): qty 30

## 2022-02-03 NOTE — Progress Notes (Signed)
Subjective: ?Carmen [name] is a 31 year old female with a medical history significant for sickle cell disease, chronic pain syndrome, opiate dependence and tolerance, history of major depression, and history of anemia of chronic disease was admitted for sickle cell pain crisis. ? ?Patient is complaining of worsening pain to low back this morning.  Patient states that pain intensity has slightly decreased overnight. She rates pain as 7/10.  She denies any headache, chest pain, urinary symptoms, nausea, vomiting, or diarrhea. ? ?Objective: ? ?Vital signs in last 24 hours: ? ?Vitals:  ? 02/02/22 2114 02/02/22 2310 02/03/22 0540 02/03/22 0842  ?BP: 115/68  118/70   ?Pulse: 69  82   ?Resp: '18 18 18 17  '$ ?Temp:   97.6 ?F (36.4 ?C)   ?TempSrc:   Oral   ?SpO2:  99% 96% 98%  ?Weight:      ?Height:      ? ? ?Intake/Output from previous day: ? ? ?Intake/Output Summary (Last 24 hours) at 02/03/2022 1139 ?Last data filed at 02/03/2022 1030 ?Gross per 24 hour  ?Intake 1761.21 ml  ?Output --  ?Net 1761.21 ml  ? ? ?Physical Exam: ?General: Alert, awake, oriented x3, in no acute distress.  ?HEENT: [place]/AT PEERL, EOMI ?Neck: Trachea midline,  no masses, no thyromegal,y no JVD, no carotid bruit ?OROPHARYNX:  Moist, No exudate/ erythema/lesions.  ?Heart: Regular rate and rhythm, without murmurs, rubs, gallops, PMI non-displaced, no heaves or thrills on palpation.  ?Lungs: Clear to auscultation, no wheezing or rhonchi noted. No increased vocal fremitus resonant to percussion  ?Abdomen: Soft, nontender, nondistended, positive bowel sounds, no masses no hepatosplenomegaly noted.Marland Kitchen  ?Neuro: No focal neurological deficits noted cranial nerves II through XII grossly intact. DTRs 2+ bilaterally upper and lower extremities. Strength 5 out of 5 in bilateral upper and lower extremities. ?Musculoskeletal: No warm swelling or erythema around joints, no spinal tenderness noted. ?Psychiatric: Patient alert and oriented x3, good insight and cognition, good  recent to remote recall. ?Lymph node survey: No cervical axillary or inguinal lymphadenopathy noted. ? ?Lab Results: ? ?Basic Metabolic Panel: ?   ?Component Value Date/Time  ? NA 136 02/02/2022 0915  ? NA 142 07/02/2020 1040  ? K 3.8 02/02/2022 0915  ? CL 105 02/02/2022 0915  ? CO2 24 02/02/2022 0915  ? BUN 13 02/02/2022 0915  ? BUN 8 07/02/2020 1040  ? CREATININE 0.67 02/02/2022 0915  ? GLUCOSE 103 (H) 02/02/2022 0915  ? CALCIUM 9.2 02/02/2022 0915  ? ?CBC: ?   ?Component Value Date/Time  ? WBC 12.6 (H) 02/02/2022 0915  ? HGB 10.7 (L) 02/02/2022 0915  ? HGB 9.8 (L) 07/02/2020 1040  ? HCT 29.8 (L) 02/02/2022 0915  ? HCT 28.9 (L) 07/02/2020 1040  ? PLT 376 02/02/2022 0915  ? PLT 628 (H) 07/02/2020 1040  ? MCV 83.9 02/02/2022 0915  ? MCV 88 07/02/2020 1040  ? NEUTROABS 4.7 02/01/2022 1142  ? NEUTROABS 3.0 07/02/2020 1040  ? LYMPHSABS 2.4 02/01/2022 1142  ? LYMPHSABS 3.5 (H) 07/02/2020 1040  ? MONOABS 0.8 02/01/2022 1142  ? EOSABS 0.2 02/01/2022 1142  ? EOSABS 0.2 07/02/2020 1040  ? BASOSABS 0.0 02/01/2022 1142  ? BASOSABS 0.1 07/02/2020 1040  ? ? ?No results found for this or any previous visit (from the past 240 hour(s)). ? ?Studies/Results: ?No results found. ? ?Medications: ?Scheduled Meds: ? citalopram  20 mg Oral Daily  ? cyclobenzaprine  10 mg Oral BID  ? DULoxetine  60 mg Oral Daily  ? enoxaparin (LOVENOX) injection  40 mg Subcutaneous J50K  ? folic acid  1 mg Oral Daily  ? HYDROmorphone   Intravenous Q4H  ? ketorolac  15 mg Intravenous Q6H  ? morphine  60 mg Oral Q12H  ? senna-docusate  1 tablet Oral BID  ? ?Continuous Infusions: ? sodium chloride 20 mL/hr at 02/02/22 1825  ? ?PRN Meds:.diphenhydrAMINE, naloxone **AND** sodium chloride flush, ondansetron, oxyCODONE, polyethylene glycol ? ?Consultants: ?none ? ?Procedures: ?none ? ?Antibiotics: ?none ? ?Assessment/Plan: ?Principal Problem: ?  Sickle cell pain crisis ([place]) ?Active Problems: ?  Anemia of chronic disease ?  Chronic musculoskeletal pain ?  Major  depressive disorder ? ?Sickle cell disease with pain crisis: ?Continue IV Dilaudid PCA without any changes in settings ?Toradol 15 mg IV every 6 hours ?Monitor vital signs very closely, reevaluate pain scale regularly, and supplemental oxygen as needed ? ?Anemia of chronic disease: ?Patient's hemoglobin remained stable and consistent with her baseline.  There is no clinical indication for blood transfusions at this time.  Follow labs. ? ?Chronic pain syndrome: ?Continue home medications ? ?History of major depression: ?Stable.  Continue home medication.  No suicidal homicidal intentions today. ? ?Code Status: Full Code ?Family Communication: N/A ?Disposition Plan: Not yet ready for discharge ? ?[name]  APRN, MSN, FNP-C ?Patient [place] ?[facility] ?[address]  ?[place], [place] [address] ?[phone] ? ?If 7PM-7AM, please contact night-coverage. ? ?02/03/2022, 11:39 AM  LOS: 2 days   ?

## 2022-02-03 NOTE — Clinical Social Work Note (Signed)
?  Transition of Care (TOC) Screening Note ? ? ?Patient Details  ?Name: Carmen [name] ?Date of Birth: [birth year] ? ? ?Transition of Care (TOC) CM/SW Contact:    ?Trish Mage, LCSW ?Phone Number: ?02/03/2022, 9:06 AM ? ? ? ?Transition of Care Department [facility]) has reviewed patient and no TOC needs have been identified at this time. We will continue to monitor patient advancement through interdisciplinary progression rounds. If new patient transition needs arise, please place a TOC consult. ? ? ?

## 2022-02-04 ENCOUNTER — Other Ambulatory Visit: Payer: Self-pay | Admitting: Internal Medicine

## 2022-02-04 ENCOUNTER — Encounter: Payer: Self-pay | Admitting: Family Medicine

## 2022-02-04 ENCOUNTER — Telehealth: Payer: Self-pay

## 2022-02-04 DIAGNOSIS — D5700 Hb-SS disease with crisis, unspecified: Secondary | ICD-10-CM | POA: Diagnosis not present

## 2022-02-04 MED ORDER — MORPHINE SULFATE ER 60 MG PO TBCR: 60.0000 mg | EXTENDED_RELEASE_TABLET | Freq: Two times a day (BID) | ORAL | 0 refills | Status: DC

## 2022-02-04 MED ORDER — DULOXETINE HCL 30 MG PO CPEP: 60.0000 mg | ORAL_CAPSULE | Freq: Every day | ORAL | 1 refills | Status: DC

## 2022-02-04 MED ORDER — OXYCODONE HCL 10 MG PO TABS: 10.0000 mg | ORAL_TABLET | Freq: Four times a day (QID) | ORAL | 0 refills | Status: DC | PRN

## 2022-02-04 NOTE — Discharge Summary (Signed)
Physician Discharge Summary  ?[name]:[id number] DOB: 1991-05-12 DOA: 02/01/2022 ? ?PCP: Pcp, No ? ?Admit date: 02/01/2022 ? ?Discharge date: 02/04/2022 ? ?Discharge Diagnoses:  ?Principal Problem: ?  Sickle cell pain crisis ([place]) ?Active Problems: ?  Anemia of chronic disease ?  Chronic musculoskeletal pain ?  Major depressive disorder ? ? ?Discharge Condition: Stable ? ?Disposition:  ?Pt is discharged home in good condition and is to follow up with [name] this week to have labs evaluated. AFTEN LIPSEY is instructed to increase activity slowly and balance with rest for the next few days, and use prescribed medication to complete treatment of pain ? ?Diet: Regular ?Wt Readings from Last 3 Encounters:  ?02/01/22 83.9 kg  ?12/31/21 86.2 kg  ?12/02/21 86.2 kg  ? ? ?History of present illness:  ?Carmen [name]  is a 31 y.o. female with a medical history significant for sickle cell disease, chronic pain syndrome, opiate dependence and tolerance, history of anemia of chronic disease, and major depressive disorder presented to the emergency department with a 4-day history of low back and lower extremity pain that is consistent with her previous sickle cell pain crisis.  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  She has been taking MS Contin and oxycodone as prescribed without sustained relief.  Her current pain intensity is 9/10 primarily to low back, bilateral hips, and lower extremities.  No fever, chills, chest pain, or shortness of breath.  No urinary symptoms, nausea, vomiting, or diarrhea. ? ?ER course: ?While in the ER, temperature 98.4 ?F, BP 124/86, pulse rate 71, respirations 14, and oxygen saturation 98% on room air.  Both complete metabolic panel and CBC with differential unremarkable.  Patient's pain persists despite IV Dilaudid, IV Toradol, and IV fluids.  Patient admitted to [facility] for further management of sickle cell pain crisis. ? ?Hospital  Course:  ?Sickle cell pain crisis Patient was admitted for sickle cell pain crisis and managed appropriately with IVF, IV Dilaudid via PCA and IV Toradol, as well as other adjunct therapies per sickle cell pain management protocols.  Patient advised to resume home medications, she will follow-up with PCP for medication management in 1 week.  Patient's labs remained stable throughout admission. ? ?Patient was therefore discharged home today in a hemodynamically stable condition.  ? ?Carmen [name] will follow-up with PCP within 1 week of this discharge. Carmen [name] was counseled extensively about nonpharmacologic means of pain management, patient verbalized understanding and was appreciative of  the care received during this admission.  ? ?We discussed the need for good hydration, monitoring of hydration status, avoidance of heat, cold, stress, and infection triggers. We discussed the need to be adherent with taking home medications. Patient was reminded of the need to seek medical attention immediately if any symptom of bleeding, anemia, or infection occurs. ? ?Discharge Exam: ?Vitals:  ? 02/04/22 1000 02/04/22 1152  ?BP:  117/84  ?Pulse:  84  ?Resp: 13 12  ?Temp:  98.7 ?F (37.1 ?C)  ?SpO2: 97% 97%  ? ?Vitals:  ? 02/04/22 0748 02/04/22 0810 02/04/22 1000 02/04/22 1152  ?BP: 105/74   117/84  ?Pulse: 84   84  ?Resp:  '16 13 12  '$ ?Temp: 98.6 ?F (37 ?C)   98.7 ?F (37.1 ?C)  ?TempSrc: Oral   Oral  ?SpO2: 96% 97% 97% 97%  ?Weight:      ?Height:      ? ? ?General appearance : Awake, alert, not in any  distress. Speech Clear. Not toxic looking ?HEENT: Atraumatic and Normocephalic, pupils equally reactive to light and accomodation ?Neck: Supple, no JVD. No cervical lymphadenopathy.  ?Chest: Good air entry bilaterally, no added sounds  ?CVS: S1 S2 regular, no murmurs.  ?Abdomen: Bowel sounds present, Non tender and not distended with no gaurding, rigidity or rebound. ?Extremities: B/L Lower Ext shows no edema, both legs are warm to  touch ?Neurology: Awake alert, and oriented X 3, CN II-XII intact, Non focal ?Skin: No Rash ? ?Discharge Instructions ? ?Discharge Instructions   ? ? Discharge patient   Complete by: As directed ?  ? Discharge disposition: 01-Home or Self Care  ? Discharge patient date: 02/04/2022  ? ?  ? ?Allergies as of 02/04/2022   ? ?   Reactions  ? Ketamine Hives, Other (See Comments)  ? Makes feel funny  ? ?  ? ?  ?Medication List  ?  ? ?TAKE these medications   ? ?acetaminophen 500 MG tablet ?Commonly known as: TYLENOL ?Take 1,000 mg by mouth daily as needed (pain). ?  ?betamethasone valerate ointment 0.1 % ?Commonly known as: VALISONE ?APPLY TO AFFECTED AREA TWICE A DAY ?Strength: 0.1 % ?What changed:  ?how much to take ?when to take this ?reasons to take this ?additional instructions ?  ?citalopram 20 MG tablet ?Commonly known as: CELEXA ?TAKE 1 TABLET BY MOUTH EVERY DAY ?What changed:  ?when to take this ?reasons to take this ?  ?cyclobenzaprine 10 MG tablet ?Commonly known as: FLEXERIL ?TAKE 1 TABLET BY MOUTH TWICE A DAY ?What changed:  ?when to take this ?reasons to take this ?  ?DULoxetine 30 MG capsule ?Commonly known as: CYMBALTA ?Take 2 capsules (60 mg total) by mouth daily. ?What changed:  ?how much to take ?when to take this ?reasons to take this ?  ?folic acid 1 MG tablet ?Commonly known as: FOLVITE ?Take 1 tablet (1 mg total) by mouth daily. ?What changed: when to take this ?  ?morphine 60 MG 12 hr tablet ?Commonly known as: MS CONTIN ?Take 60 mg by mouth 2 (two) times daily as needed for pain. ?  ?Oxycodone HCl 10 MG Tabs ?Take 1 tablet (10 mg total) by mouth every 6 (six) hours as needed (pain). ?  ?Vitamin D (Ergocalciferol) 1.25 MG (50000 UNIT) Caps capsule ?Commonly known as: DRISDOL ?TAKE ONE CAPSULE BY MOUTH ONE TIME PER WEEK ?What changed:  ?how much to take ?how to take this ?when to take this ?additional instructions ?  ? ?  ? ? ?The results of significant diagnostics from this hospitalization (including  imaging, microbiology, ancillary and laboratory) are listed below for reference.   ? ?Significant Diagnostic Studies: ?No results found. ? ?Microbiology: ?No results found for this or any previous visit (from the past 240 hour(s)).  ? ?Labs: ?Basic Metabolic Panel: ?Recent Labs  ?Lab 02/01/22 ?1142 02/02/22 ?0915  ?NA 137 136  ?K 4.0 3.8  ?CL 105 105  ?CO2 26 24  ?GLUCOSE 103* 103*  ?BUN 10 13  ?CREATININE 0.71 0.67  ?CALCIUM 9.4 9.2  ? ?Liver Function Tests: ?Recent Labs  ?Lab 02/01/22 ?1142  ?AST 28  ?ALT 31  ?ALKPHOS 67  ?BILITOT 0.8  ?PROT 8.3*  ?ALBUMIN 4.4  ? ?No results for input(s): LIPASE, AMYLASE in the last 168 hours. ?No results for input(s): AMMONIA in the last 168 hours. ?CBC: ?Recent Labs  ?Lab 02/01/22 ?1142 02/02/22 ?0915  ?WBC 8.1 12.6*  ?NEUTROABS 4.7  --   ?HGB 11.7* 10.7*  ?  HCT 33.1* 29.8*  ?MCV 84.9 83.9  ?PLT 541* 376  ? ?Cardiac Enzymes: ?No results for input(s): CKTOTAL, CKMB, CKMBINDEX, TROPONINI in the last 168 hours. ?BNP: ?Invalid input(s): POCBNP ?CBG: ?No results for input(s): GLUCAP in the last 168 hours. ? ?Time coordinating discharge: 50 minutes ? ?Signed: ? ?[name]  APRN, MSN, FNP-C ?Patient [place] ?[facility] ?[address]  ?[place], [place] [address] ?[phone]  ?[facility] ?02/04/2022, 5:23 PM  ? ?

## 2022-02-04 NOTE — Telephone Encounter (Signed)
Morphine ?Oxy ?

## 2022-02-15 ENCOUNTER — Encounter (INDEPENDENT_AMBULATORY_CARE_PROVIDER_SITE_OTHER): Payer: Medicaid Other | Admitting: Nurse Practitioner

## 2022-02-15 ENCOUNTER — Encounter: Payer: Self-pay | Admitting: Internal Medicine

## 2022-02-15 DIAGNOSIS — Z30019 Encounter for initial prescription of contraceptives, unspecified: Secondary | ICD-10-CM | POA: Diagnosis not present

## 2022-02-15 LAB — POCT URINE PREGNANCY: Preg Test, Ur: NEGATIVE

## 2022-02-15 MED ORDER — MEDROXYPROGESTERONE ACETATE 150 MG/ML IM SUSP
150.0000 mg | Freq: Once | INTRAMUSCULAR | Status: AC
  Administered 2022-02-15: 150 mg via INTRAMUSCULAR

## 2022-02-17 NOTE — Progress Notes (Signed)
error 

## 2022-02-22 ENCOUNTER — Other Ambulatory Visit: Payer: Self-pay | Admitting: Internal Medicine

## 2022-02-22 ENCOUNTER — Telehealth: Payer: Self-pay

## 2022-02-22 MED ORDER — OXYCODONE HCL 10 MG PO TABS: 10.0000 mg | ORAL_TABLET | Freq: Four times a day (QID) | ORAL | 0 refills | Status: DC | PRN

## 2022-02-22 NOTE — Telephone Encounter (Signed)
Oxycodone 10mg

## 2022-03-01 ENCOUNTER — Inpatient Hospital Stay (HOSPITAL_COMMUNITY)
Admission: EM | Admit: 2022-03-01 | Discharge: 2022-03-05 | DRG: 812 | Disposition: A | Discharge status: Home / Self Care | Payer: Medicaid Other | Attending: Internal Medicine | Admitting: Internal Medicine

## 2022-03-01 ENCOUNTER — Emergency Department (HOSPITAL_COMMUNITY): Payer: Medicaid Other

## 2022-03-01 ENCOUNTER — Encounter (HOSPITAL_COMMUNITY): Payer: Self-pay

## 2022-03-01 ENCOUNTER — Other Ambulatory Visit: Payer: Self-pay

## 2022-03-01 DIAGNOSIS — E876 Hypokalemia: Secondary | ICD-10-CM | POA: Diagnosis present

## 2022-03-01 DIAGNOSIS — F17210 Nicotine dependence, cigarettes, uncomplicated: Secondary | ICD-10-CM | POA: Diagnosis present

## 2022-03-01 DIAGNOSIS — Z9081 Acquired absence of spleen: Secondary | ICD-10-CM

## 2022-03-01 DIAGNOSIS — G894 Chronic pain syndrome: Secondary | ICD-10-CM | POA: Diagnosis present

## 2022-03-01 DIAGNOSIS — Z8249 Family history of ischemic heart disease and other diseases of the circulatory system: Secondary | ICD-10-CM

## 2022-03-01 DIAGNOSIS — Z8261 Family history of arthritis: Secondary | ICD-10-CM

## 2022-03-01 DIAGNOSIS — D5700 Hb-SS disease with crisis, unspecified: Secondary | ICD-10-CM | POA: Diagnosis not present

## 2022-03-01 DIAGNOSIS — Z832 Family history of diseases of the blood and blood-forming organs and certain disorders involving the immune mechanism: Secondary | ICD-10-CM

## 2022-03-01 DIAGNOSIS — R112 Nausea with vomiting, unspecified: Secondary | ICD-10-CM

## 2022-03-01 DIAGNOSIS — K5903 Drug induced constipation: Secondary | ICD-10-CM

## 2022-03-01 DIAGNOSIS — D638 Anemia in other chronic diseases classified elsewhere: Secondary | ICD-10-CM | POA: Diagnosis present

## 2022-03-01 DIAGNOSIS — F1120 Opioid dependence, uncomplicated: Secondary | ICD-10-CM | POA: Diagnosis present

## 2022-03-01 DIAGNOSIS — T402X5A Adverse effect of other opioids, initial encounter: Secondary | ICD-10-CM | POA: Diagnosis present

## 2022-03-01 DIAGNOSIS — F419 Anxiety disorder, unspecified: Secondary | ICD-10-CM | POA: Diagnosis present

## 2022-03-01 DIAGNOSIS — Z841 Family history of disorders of kidney and ureter: Secondary | ICD-10-CM

## 2022-03-01 DIAGNOSIS — F329 Major depressive disorder, single episode, unspecified: Secondary | ICD-10-CM | POA: Diagnosis present

## 2022-03-01 LAB — CBC WITH DIFFERENTIAL/PLATELET
Abs Immature Granulocytes: 0.02 K/uL (ref 0.00–0.07)
Basophils Absolute: 0 K/uL (ref 0.0–0.1)
Basophils Relative: 0 %
Eosinophils Absolute: 0.3 K/uL (ref 0.0–0.5)
Eosinophils Relative: 3 %
HCT: 28.7 % — ABNORMAL LOW (ref 36.0–46.0)
Hemoglobin: 10.4 g/dL — ABNORMAL LOW (ref 12.0–15.0)
Immature Granulocytes: 0 %
Lymphocytes Relative: 41 %
Lymphs Abs: 4 K/uL (ref 0.7–4.0)
MCH: 30.1 pg (ref 26.0–34.0)
MCHC: 36.2 g/dL — ABNORMAL HIGH (ref 30.0–36.0)
MCV: 83.2 fL (ref 80.0–100.0)
Monocytes Absolute: 0.9 K/uL (ref 0.1–1.0)
Monocytes Relative: 9 %
Neutro Abs: 4.4 K/uL (ref 1.7–7.7)
Neutrophils Relative %: 47 %
Platelets: 542 K/uL — ABNORMAL HIGH (ref 150–400)
RBC: 3.45 MIL/uL — ABNORMAL LOW (ref 3.87–5.11)
RDW: 13.3 % (ref 11.5–15.5)
WBC: 9.7 K/uL (ref 4.0–10.5)
nRBC: 0.4 % — ABNORMAL HIGH (ref 0.0–0.2)

## 2022-03-01 LAB — COMPREHENSIVE METABOLIC PANEL WITH GFR
ALT: 20 U/L (ref 0–44)
AST: 22 U/L (ref 15–41)
Albumin: 4.1 g/dL (ref 3.5–5.0)
Alkaline Phosphatase: 55 U/L (ref 38–126)
Anion gap: 6 (ref 5–15)
BUN: 10 mg/dL (ref 6–20)
CO2: 25 mmol/L (ref 22–32)
Calcium: 9.1 mg/dL (ref 8.9–10.3)
Chloride: 108 mmol/L (ref 98–111)
Creatinine, Ser: 0.62 mg/dL (ref 0.44–1.00)
GFR, Estimated: >60 mL/min
Glucose, Bld: 90 mg/dL (ref 70–99)
Potassium: 3.6 mmol/L (ref 3.5–5.1)
Sodium: 139 mmol/L (ref 135–145)
Total Bilirubin: 1.1 mg/dL (ref 0.3–1.2)
Total Protein: 7.9 g/dL (ref 6.5–8.1)

## 2022-03-01 LAB — RETICULOCYTES
Immature Retic Fract: 41.3 % — ABNORMAL HIGH (ref 2.3–15.9)
RBC.: 3.49 MIL/uL — ABNORMAL LOW (ref 3.87–5.11)
Retic Count, Absolute: 163 10*3/uL (ref 19.0–186.0)
Retic Ct Pct: 4.7 % — ABNORMAL HIGH (ref 0.4–3.1)

## 2022-03-01 MED ORDER — HYDRALAZINE HCL 20 MG/ML IJ SOLN: 10.0000 mg | Freq: Four times a day (QID) | INTRAMUSCULAR | Status: DC | PRN

## 2022-03-01 MED ORDER — ONDANSETRON 4 MG PO TBDP
8.0000 mg | ORAL_TABLET | Freq: Three times a day (TID) | ORAL | Status: DC | PRN
  Administered 2022-03-02: 8 mg via ORAL
  Filled 2022-03-01: qty 1
  Filled 2022-03-01: qty 2

## 2022-03-01 MED ORDER — HYDROMORPHONE HCL 2 MG/ML IJ SOLN
2.0000 mg | INTRAMUSCULAR | Status: DC | PRN
  Administered 2022-03-01: 2 mg via INTRAVENOUS
  Filled 2022-03-01: qty 1

## 2022-03-01 MED ORDER — SODIUM CHLORIDE 0.9 % IV SOLN
12.5000 mg | Freq: Once | INTRAVENOUS | Status: DC
  Filled 2022-03-01: qty 0.25

## 2022-03-01 MED ORDER — ACETAMINOPHEN 325 MG PO TABS: 650.0000 mg | ORAL_TABLET | Freq: Four times a day (QID) | ORAL | Status: DC | PRN

## 2022-03-01 MED ORDER — HYDROMORPHONE HCL 2 MG/ML IJ SOLN
2.0000 mg | INTRAMUSCULAR | Status: AC
  Administered 2022-03-01: 2 mg via SUBCUTANEOUS
  Filled 2022-03-01: qty 1

## 2022-03-01 MED ORDER — DIPHENHYDRAMINE HCL 25 MG PO CAPS
25.0000 mg | ORAL_CAPSULE | ORAL | Status: DC | PRN
  Administered 2022-03-01: 25 mg via ORAL
  Filled 2022-03-01: qty 1

## 2022-03-01 MED ORDER — SODIUM CHLORIDE 0.9 % IV SOLN: INTRAVENOUS | Status: DC

## 2022-03-01 MED ORDER — HYDROMORPHONE HCL 2 MG/ML IJ SOLN: 2.0000 mg | INTRAMUSCULAR | Status: DC

## 2022-03-01 MED ORDER — KETOROLAC TROMETHAMINE 15 MG/ML IJ SOLN
15.0000 mg | INTRAMUSCULAR | Status: AC
  Administered 2022-03-01: 15 mg via INTRAMUSCULAR
  Filled 2022-03-01: qty 1

## 2022-03-01 MED ORDER — FOLIC ACID 1 MG PO TABS
1.0000 mg | ORAL_TABLET | Freq: Every day | ORAL | Status: DC
  Administered 2022-03-01 – 2022-03-05 (×5): 1 mg via ORAL
  Filled 2022-03-01 (×5): qty 1

## 2022-03-01 MED ORDER — ONDANSETRON HCL 4 MG/2ML IJ SOLN: 4.0000 mg | INTRAMUSCULAR | Status: DC | PRN

## 2022-03-01 MED ORDER — ALBUTEROL SULFATE (2.5 MG/3ML) 0.083% IN NEBU
2.5000 mg | INHALATION_SOLUTION | Freq: Four times a day (QID) | RESPIRATORY_TRACT | Status: DC
  Filled 2022-03-01: qty 3

## 2022-03-01 MED ORDER — OXYCODONE HCL 5 MG PO TABS
10.0000 mg | ORAL_TABLET | Freq: Four times a day (QID) | ORAL | Status: DC | PRN
  Administered 2022-03-01 – 2022-03-05 (×5): 10 mg via ORAL
  Filled 2022-03-01 (×5): qty 2

## 2022-03-01 MED ORDER — ALBUTEROL SULFATE (2.5 MG/3ML) 0.083% IN NEBU: 2.5000 mg | INHALATION_SOLUTION | Freq: Four times a day (QID) | RESPIRATORY_TRACT | Status: DC | PRN

## 2022-03-01 MED ORDER — ENOXAPARIN SODIUM 40 MG/0.4ML IJ SOSY: 40.0000 mg | PREFILLED_SYRINGE | INTRAMUSCULAR | Status: DC

## 2022-03-01 MED ORDER — CITALOPRAM HYDROBROMIDE 20 MG PO TABS
20.0000 mg | ORAL_TABLET | Freq: Every morning | ORAL | Status: DC
  Administered 2022-03-02 – 2022-03-05 (×4): 20 mg via ORAL
  Filled 2022-03-01 (×4): qty 1

## 2022-03-01 MED ORDER — HYDROMORPHONE HCL 2 MG/ML IJ SOLN
2.0000 mg | INTRAMUSCULAR | Status: DC | PRN
  Administered 2022-03-01 (×2): 2 mg via INTRAVENOUS
  Filled 2022-03-01 (×2): qty 1

## 2022-03-01 MED ORDER — HYDROMORPHONE HCL 2 MG/ML IJ SOLN
2.0000 mg | Freq: Once | INTRAMUSCULAR | Status: AC
  Administered 2022-03-01: 2 mg via INTRAVENOUS
  Filled 2022-03-01: qty 1

## 2022-03-01 MED ORDER — ACETAMINOPHEN 650 MG RE SUPP: 650.0000 mg | Freq: Four times a day (QID) | RECTAL | Status: DC | PRN

## 2022-03-01 MED ORDER — SODIUM CHLORIDE 0.45 % IV SOLN: INTRAVENOUS | Status: DC

## 2022-03-01 MED ORDER — KETOROLAC TROMETHAMINE 15 MG/ML IJ SOLN: 15.0000 mg | INTRAMUSCULAR | Status: DC

## 2022-03-01 MED ORDER — MORPHINE SULFATE ER 15 MG PO TBCR
60.0000 mg | EXTENDED_RELEASE_TABLET | Freq: Two times a day (BID) | ORAL | Status: DC
Start: 2022-03-01 — End: 2022-03-05
  Administered 2022-03-01 – 2022-03-05 (×8): 60 mg via ORAL
  Filled 2022-03-01 (×8): qty 4

## 2022-03-01 NOTE — ED Provider Notes (Signed)
?[facility] DEPT ?Provider Note ? ? ?CSN: [id number] ?Arrival date & time: 03/01/22  1019 ? ?  ? ?History ? ?Chief Complaint  ?Patient presents with  ? Sickle Cell Pain Crisis  ? ? ?Carmen [name] is a 31 y.o. female with hx of sickle cell disease who presents to the ED for evaluation of  sickle cell pain that started last week. Pt normal avoids using opiates until her pain gets to a certain level, but last week she called her doctor who prescribed her '10mg'$  oxycodone along with '60mg'$  morphine. Neither of these seem to improving her pain. Currently complaining of bilateral lower extremity pain, lower back pain, right shoulder pain. Also endorses mild shortness of breath when she experiences a wave a pain. Denies chest pain, abd pain, n/v/d, fevers.  ? ? ?Sickle Cell Pain Crisis ? ?  ? ?Home Medications ?Prior to Admission medications   ?Medication Sig Start Date End Date Taking? Authorizing Provider  ?betamethasone valerate ointment (VALISONE) 0.1 % APPLY TO AFFECTED AREA TWICE A DAY ?Strength: 0.1 % ?Patient taking differently: 1 application. 2 (two) times daily as needed (breakouts/rash). 12/23/21  Yes Vevelyn Francois, NP  ?citalopram (CELEXA) 20 MG tablet TAKE 1 TABLET BY MOUTH EVERY DAY ?Patient taking differently: Take 20 mg by mouth every morning. 02/24/21  Yes [name], FNP  ?folic acid (FOLVITE) 1 MG tablet Take 1 tablet (1 mg total) by mouth daily. ?Patient taking differently: Take 1 mg by mouth every morning. 03/15/19  Yes Lanae Boast, [place]  ?morphine (MS CONTIN) 60 MG 12 hr tablet Take 1 tablet (60 mg total) by mouth every 12 (twelve) hours. 02/04/22 03/06/22 Yes Tresa Garter, MD  ?Oxycodone HCl 10 MG TABS Take 1 tablet (10 mg total) by mouth every 6 (six) hours as needed for up to 15 days (pain). 02/22/22 03/09/22 Yes Tresa Garter, MD  ?Prenatal Vit-Fe Fumarate-FA (PRENATAL VITAMIN PLUS LOW IRON) 27-1 MG TABS Take 1 tablet by mouth every morning.   Yes [provider]  ?Vitamin D, Ergocalciferol, (DRISDOL) 1.25 MG (50000 UNIT) CAPS capsule TAKE ONE CAPSULE BY MOUTH ONE TIME PER WEEK ?Patient taking differently: Take 50,000 Units by mouth every Wednesday. 09/03/20  Yes Vevelyn Francois, NP  ?cyclobenzaprine (FLEXERIL) 10 MG tablet TAKE 1 TABLET BY MOUTH TWICE A DAY ?Patient not taking: Reported on 03/01/2022 10/12/21   Vevelyn Francois, NP  ?DULoxetine (CYMBALTA) 30 MG capsule Take 2 capsules (60 mg total) by mouth daily. ?Patient not taking: Reported on 03/01/2022 02/04/22 08/03/22  [name], FNP  ?   ? ?Allergies    ?Ketamine   ? ?Review of Systems   ?Review of Systems ? ?Physical Exam ?Updated Vital Signs ?BP 119/64   Pulse 72   Temp 97.8 ?F (36.6 ?C) (Oral)   Resp 11   LMP 02/15/2022 (Approximate)   SpO2 96%  ?Physical Exam ?Vitals and nursing note reviewed.  ?Constitutional:   ?   General: She is not in acute distress. ?   Appearance: She is not ill-appearing.  ?HENT:  ?   Head: Atraumatic.  ?Eyes:  ?   Conjunctiva/sclera: Conjunctivae normal.  ?Cardiovascular:  ?   Rate and Rhythm: Normal rate and regular rhythm.  ?   Pulses: Normal pulses.  ?   Heart sounds: No murmur heard. ?Pulmonary:  ?   Effort: Pulmonary effort is normal. No respiratory distress.  ?   Breath sounds: Normal breath sounds.  ?  Comments: Speaking in complete sentences ?Abdominal:  ?   General: Abdomen is flat. There is no distension.  ?   Palpations: Abdomen is soft.  ?   Tenderness: There is no abdominal tenderness.  ?Musculoskeletal:     ?   General: Normal range of motion.  ?   Cervical back: Normal range of motion.  ?Skin: ?   General: Skin is warm and dry.  ?   Capillary Refill: Capillary refill takes less than 2 seconds.  ?Neurological:  ?   General: No focal deficit present.  ?   Mental Status: She is alert.  ?Psychiatric:     ?   Mood and Affect: Mood normal.  ? ? ?ED Results / Procedures / Treatments   ?Labs ?(all labs ordered are listed, but only abnormal results are  displayed) ?Labs Reviewed  ?RETICULOCYTES - Abnormal; Notable for the following components:  ?    Result Value  ? Retic Ct Pct 4.7 (*)   ? RBC. 3.49 (*)   ? Immature Retic Fract 41.3 (*)   ? All other components within normal limits  ?CBC WITH DIFFERENTIAL/PLATELET - Abnormal; Notable for the following components:  ? RBC 3.45 (*)   ? Hemoglobin 10.4 (*)   ? HCT 28.7 (*)   ? MCHC 36.2 (*)   ? Platelets 542 (*)   ? nRBC 0.4 (*)   ? All other components within normal limits  ?COMPREHENSIVE METABOLIC PANEL  ?I-STAT BETA HCG BLOOD, ED ([facility], [facility], [facility] ONLY)  ? ? ?EKG ?None ? ?Radiology ?DG Chest 2 View ? ?Result Date: 03/01/2022 ?CLINICAL DATA:  Shortness of breath. History of sickle cell disease. EXAM: CHEST - 2 VIEW COMPARISON:  10/31/2021 FINDINGS: The cardiac silhouette, mediastinal and hilar contours are normal. The lungs are clear. No pleural effusions. No pulmonary lesions. The bony thorax is intact. IMPRESSION: No acute cardiopulmonary findings. Electronically Signed   By: Marijo Sanes M.D.   On: 03/01/2022 12:17   ? ?Procedures ?Procedures  ? ? ?Medications Ordered in ED ?Medications  ?diphenhydrAMINE (BENADRYL) capsule 25-50 mg (25 mg Oral Given 03/01/22 1442)  ?ondansetron (ZOFRAN-ODT) disintegrating tablet 8 mg (has no administration in time range)  ?HYDROmorphone (DILAUDID) injection 2 mg (has no administration in time range)  ?ketorolac (TORADOL) 15 MG/ML injection 15 mg (15 mg Intramuscular Given 03/01/22 1443)  ?HYDROmorphone (DILAUDID) injection 2 mg (2 mg Subcutaneous Given 03/01/22 1443)  ?HYDROmorphone (DILAUDID) injection 2 mg (2 mg Subcutaneous Given 03/01/22 1517)  ? ? ?ED Course/ Medical Decision Making/ A&P ?  ?                        ?Medical Decision Making ?Amount and/or Complexity of Data Reviewed ?Labs: ordered. ?Radiology: ordered. ? ?Risk ?Prescription drug management. ? ? ?History:  ?Per HPI ?Social determinants of health:  ?Social History  ? ?Socioeconomic History  ? Marital status: Single  ?  Spouse  name: Not on file  ? Number of children: Not on file  ? Years of education: Not on file  ? Highest education level: Not on file  ?Occupational History  ? Not on file  ?Tobacco Use  ? Smoking status: Some Days  ?  Types: Cigarettes  ? Smokeless tobacco: Never  ?Vaping Use  ? Vaping Use: Never used  ?Substance and Sexual Activity  ? Alcohol use: No  ? Drug use: No  ? Sexual activity: Yes  ?  Birth control/protection: Injection  ?Other Topics Concern  ?  Not on file  ?Social History Narrative  ? Not on file  ? ?Social Determinants of Health  ? ?Financial Resource Strain: Not on file  ?Food Insecurity: Not on file  ?Transportation Needs: No Transportation Needs  ? Lack of Transportation (Medical): No  ? Lack of Transportation (Non-Medical): No  ?Physical Activity: Not on file  ?Stress: Not on file  ?Social Connections: Not on file  ?Intimate Partner Violence: Not on file  ? ? ? ?Initial impression: ? ?This patient presents to the ED for concern of sickle cell pain, this involves an extensive number of treatment options, and is a complaint that carries with it a high risk of complications and morbidity.   Emergent concerns include acute chest, splenic sequestration ? ? ?Lab Tests and EKG: ? ?I Ordered, reviewed, and interpreted labs and EKG.  The pertinent results include:  ?CMP unremarkable ?CBC and reticulocytes with robust generation ? ? ?Imaging Studies ordered: ? ?I ordered imaging studies including  ?Chest xray without acute findings  ?I independently visualized and interpreted imaging and I agree with the radiologist interpretation.  ? ? ?Cardiac Monitoring: ? ?The patient was maintained on a cardiac monitor.  I personally viewed and interpreted the cardiac monitored which showed an underlying rhythm of: NSR ? ? ?Medicines ordered and prescription drug management: ? ?I ordered medication including: ?Diluadid '2mg'$  IV x2, pending third dose ?Toradol '15mg'$  ?Benadryl '25mg'$  po    ?Reevaluation of the patient after these  medicines showed that the patient improved ?I have reviewed the patients home medicines and have made adjustments as needed ? ? ?ED Course: ?31 year old female in no acute distress, nontoxic-appearing presents to t

## 2022-03-01 NOTE — H&P (Signed)
?[facility] ?History and Physical ? ?Carmen [name] MEQ:[id number] DOB: 1991/07/07 DOA: 03/01/2022 ?PCP: Fenton Foy, NP  ?Admitted from: Home ?Chief Complaint: Generalized pain ? ?History of Present Illness: ?Carmen [name] is a 31 y.o. female with medical history of sickle cell disease, chronic pain syndrome,  chronic anemia, major depression. ?Patient presented to the ED this morning with complaint of generalized pain for several days with no relief from medicines at home. ? ?Patient states she has had multiple sickle cell crisis throughout her life.  Never needed intubation.  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  She has required multiple courses of antibiotics throughout her lifetime. ?She states he tries to avoid opioids and is not on any opioid in between the crisis. ?Typically her pain crises are triggered by mental stress and cold weather. She states her job is physically and mentally stressful. ?Her last hospitalization was last month 4/3 to 4/6 during which she required Dilaudid drip. ? ?Her pain started last week.  She went to her PCP and was prescribed 60 mg of morphine twice daily and 10 mg oxycodone as needed despite which her symptoms did not improve and hence he presented to the ED.   ? ?In the ED, she was hemodynamically stable, able to breathe on room air  ?Because of significant pain, she required several doses of IV Dilaudid throughout the day and still continues to have pain. ?Hospitalist service consulted for inpatient admission and management. ?She does not have any symptoms or signs of infection.   ?No fever.  WBC count normal.  Chest x-ray without any infiltrates. ? ?At the time of my evaluation, patient was sitting up in the bed.  Not in distress.  Pain controlled with the last dose of IV Dilaudid.  History details as above. ? ?Review of Systems:  ?All systems were reviewed and were negative unless otherwise mentioned in the HPI ? ? ?Past medical history: ?Past Medical History:   ?Diagnosis Date  ? Anxiety   ? Headache(784.0)   ? Heart murmur   ? Sickle cell crisis ([place])   ? Syphilis 2015  ? Was diagnosed and received one injection of antibiotics  ? Thrombocytosis 11/22/2014  ?   CBC Latest Ref Rng & Units 12/13/2018 12/11/2018 12/10/2018 WBC 4.0 - 10.5 K/uL 14.8(H) 13.2(H) 15.9(H) Hemoglobin 12.0 - 15.0 g/dL 8.2(L) 7.7(L) 8.1(L) Hematocrit 36.0 - 46.0 % 23.7(L) 23.2(L) 24.5(L) Platelets 150 - 400 K/uL 326 399 388    ? ? ?Past surgical history: ?Past Surgical History:  ?Procedure Laterality Date  ? CESAREAN SECTION N/A 02/05/2019  ? Procedure: CESAREAN SECTION;  Surgeon: [name], MD;  Location: [facility];  Service: Obstetrics;  Laterality: N/A;  ? CHOLECYSTECTOMY N/A 11/30/2014  ? Procedure: LAPAROSCOPIC CHOLECYSTECTOMY SINGLE SITE WITH INTRAOPERATIVE CHOLANGIOGRAM;  Surgeon: Michael Boston, MD;  Location: [facility];  Service: General;  Laterality: N/A;  ? SPLENECTOMY    ? ? ?Social History: ? reports that she has been smoking cigarettes. She has never used smokeless tobacco. She reports that she does not drink alcohol and does not use drugs. ? ?Allergies:  ?Allergies  ?Allergen Reactions  ? Ketamine Hives and Other (See Comments)  ?  Makes feel funny  ? ?Ketamine ? ? ?Family history:  ?Family History  ?Problem Relation Age of Onset  ? Hypertension Mother   ? Sickle cell anemia Sister   ? Kidney disease Sister   ?     Lupus  ? Arthritis Sister   ?  Sickle cell anemia Sister   ? Sickle cell trait Sister   ? Heart disease Maternal Aunt   ?     CABG  ? Heart disease Maternal Uncle   ?     CABG  ? Lupus Sister   ?  ? ?Home Meds: ?Prior to Admission medications   ?Medication Sig Start Date End Date Taking? Authorizing Provider  ?betamethasone valerate ointment (VALISONE) 0.1 % APPLY TO AFFECTED AREA TWICE A DAY ?Strength: 0.1 % ?Patient taking differently: 1 application. 2 (two) times daily as needed (breakouts/rash). 12/23/21  Yes Vevelyn Francois, NP  ?citalopram (CELEXA) 20 MG tablet TAKE 1 TABLET  BY MOUTH EVERY DAY ?Patient taking differently: Take 20 mg by mouth every morning. 02/24/21  Yes [name], FNP  ?folic acid (FOLVITE) 1 MG tablet Take 1 tablet (1 mg total) by mouth daily. ?Patient taking differently: Take 1 mg by mouth every morning. 03/15/19  Yes Lanae Boast, [place]  ?morphine (MS CONTIN) 60 MG 12 hr tablet Take 1 tablet (60 mg total) by mouth every 12 (twelve) hours. 02/04/22 03/06/22 Yes Tresa Garter, MD  ?Oxycodone HCl 10 MG TABS Take 1 tablet (10 mg total) by mouth every 6 (six) hours as needed for up to 15 days (pain). 02/22/22 03/09/22 Yes Tresa Garter, MD  ?Prenatal Vit-Fe Fumarate-FA (PRENATAL VITAMIN PLUS LOW IRON) 27-1 MG TABS Take 1 tablet by mouth every morning.   Yes [provider]  ?Vitamin D, Ergocalciferol, (DRISDOL) 1.25 MG (50000 UNIT) CAPS capsule TAKE ONE CAPSULE BY MOUTH ONE TIME PER WEEK ?Patient taking differently: Take 50,000 Units by mouth every Wednesday. 09/03/20  Yes Vevelyn Francois, NP  ?cyclobenzaprine (FLEXERIL) 10 MG tablet TAKE 1 TABLET BY MOUTH TWICE A DAY ?Patient not taking: Reported on 03/01/2022 10/12/21   Vevelyn Francois, NP  ?DULoxetine (CYMBALTA) 30 MG capsule Take 2 capsules (60 mg total) by mouth daily. ?Patient not taking: Reported on 03/01/2022 02/04/22 08/03/22  [name], FNP  ? ? ?Physical Exam: ?Vitals:  ? 03/01/22 1530 03/01/22 1605 03/01/22 1630 03/01/22 1708  ?BP: 112/63 (!) 98/55 110/61 97/62  ?Pulse: 74 64 69 63  ?Resp: '16 15 11 13  '$ ?Temp:      ?TempSrc:      ?SpO2: 97% 97% 100% 100%  ? ?Wt Readings from Last 3 Encounters:  ?02/01/22 83.9 kg  ?12/31/21 86.2 kg  ?12/02/21 86.2 kg  ? ?There is no height or weight on file to calculate BMI. ? ?General exam: Pleasant, young African-American female.  Not in distress at the time of my evaluation ?Skin: No rashes, lesions or ulcers. ?HEENT: Atraumatic, normocephalic, no obvious bleeding ?Lungs: Clear to auscultation bilaterally ?CVS: Regular rate and rhythm, no murmur ?GI/Abd  soft, nontender, nondistended, bowel sound present ?CNS: Alert, awake, oriented x3 ?Psychiatry: Mood appropriate ?Extremities: No pedal edema, no calf tenderness ? ? ?  ?Consult Orders  ?(From admission, onward)  ?  ? ? ?  ? ?  Start     Ordered  ? 03/01/22 1659  Consult to hospitalist  Once       ?Provider:  (Not yet assigned)  ?Question Answer Comment  ?Place call to: [facility]   ?Reason for Consult Admit   ?  ? 03/01/22 1658  ? ?  ?  ? ?  ? ? ?Labs on Admission:  ? ?CBC: ?Recent Labs  ?Lab 03/01/22 ?1430  ?WBC 9.7  ?NEUTROABS 4.4  ?HGB 10.4*  ?HCT 28.7*  ?MCV  83.2  ?PLT 542*  ? ? ?Basic Metabolic Panel: ?Recent Labs  ?Lab 03/01/22 ?1430  ?NA 139  ?K 3.6  ?CL 108  ?CO2 25  ?GLUCOSE 90  ?BUN 10  ?CREATININE 0.62  ?CALCIUM 9.1  ? ? ?Liver Function Tests: ?Recent Labs  ?Lab 03/01/22 ?1430  ?AST 22  ?ALT 20  ?ALKPHOS 55  ?BILITOT 1.1  ?PROT 7.9  ?ALBUMIN 4.1  ? ?No results for input(s): LIPASE, AMYLASE in the last 168 hours. ?No results for input(s): AMMONIA in the last 168 hours. ? ?Cardiac Enzymes: ?No results for input(s): CKTOTAL, CKMB, CKMBINDEX, TROPONINI in the last 168 hours. ? ?BNP (last 3 results) ?No results for input(s): BNP in the last 8760 hours. ? ?ProBNP (last 3 results) ?No results for input(s): PROBNP in the last 8760 hours. ? ?CBG: ?No results for input(s): GLUCAP in the last 168 hours. ? ?Lipase  ?   ?Component Value Date/Time  ? LIPASE 15 04/11/2017 0850  ?  ? ?Urinalysis ?   ?Component Value Date/Time  ? [place] YELLOW 02/01/2022 1837  ? APPEARANCEUR HAZY (A) 02/01/2022 1837  ? LABSPEC 1.016 02/01/2022 1837  ? PHURINE 5.0 02/01/2022 1837  ? [place] NEGATIVE 02/01/2022 1837  ? HGBUR SMALL (A) 02/01/2022 1837  ? [place] NEGATIVE 02/01/2022 1837  ? BILIRUBINUR negative 08/26/2021 1527  ? BILIRUBINUR Negative 08/10/2019 1534  ? [place] NEGATIVE 02/01/2022 1837  ? [place] NEGATIVE 02/01/2022 1837  ? UROBILINOGEN 1.0 08/26/2021 1527  ? UROBILINOGEN 1.0 01/12/2018 0943  ? NITRITE  NEGATIVE 02/01/2022 1837  ? LEUKOCYTESUR TRACE (A) 02/01/2022 1837  ? ?  ?Drugs of Abuse  ?   ?Component Value Date/Time  ? LABOPIA POSITIVE (A) 09/08/2020 1030  ? COCAINSCRNUR NONE DETECTED 09/08/2020 103

## 2022-03-01 NOTE — ED Triage Notes (Signed)
Pt presents with c/o sickle cell pain for several days, no relief with home medications. Pt reports the pain is in both legs, radiating up her back and into her shoulders as well.  ?

## 2022-03-01 NOTE — ED Notes (Signed)
This Probation officer along with another RN tried to start an IV without success. IV team consult put in at 1040. Tried to contact IV multiple times without success. PA made aware. Patient was offered Subq medication. Patient declined stating " Subq hurts too much".  ?

## 2022-03-01 NOTE — ED Provider Notes (Signed)
Care assumed from [place], please see her note for full details, but in brief CHAUNDRA ABREU is a 31 y.o. female who presents with pain typical of her sickle cell crisis that started last week.  She typically does not like to take pain medications at home but called her doctor who prescribed oxycodone and morphine she is not getting relief with either of these and pain is continued to worsen primarily in the shoulder and bilateral hips and lower extremities.  No fevers, chest pain, shortness of breath or cough. ? ?Lab work is overall been reassuring, plan is to reevaluate after pain medication and if improved patient can go home but may require admission. ? ?BP 119/64   Pulse 72   Temp 97.8 ?F (36.6 ?C) (Oral)   Resp 11   LMP 02/15/2022 (Approximate)   SpO2 96%  ? ? ?Procedures  ?Procedures ? ?ED Course / MDM  ?  ?Medical Decision Making ?Amount and/or Complexity of Data Reviewed ?Labs: ordered. ?Radiology: ordered. ? ?Risk ?Prescription drug management. ? ? ?On reevaluation after 3 doses of pain medication patient reports that she has had improvement in her shoulder pain but is continuing to experience severe pain in bilateral hips that she has not gotten much relief from, she reports she has had similar pain with her crises and often requires admission, feels like she needs admission for further pain control. ? ?I requested consultation with the hospitalist service for admission, I discussed pertinent labs, imaging and plan with Dr. Pietro Cassis with [facility] who will see and admit the patient. ? ? ? ? ?  ?Jacqlyn Larsen, [place] ?03/01/22 1736 ? ?  ?Varney Biles, MD ?03/03/22 1530 ? ?

## 2022-03-02 ENCOUNTER — Telehealth: Payer: Self-pay

## 2022-03-02 DIAGNOSIS — K5903 Drug induced constipation: Secondary | ICD-10-CM | POA: Diagnosis present

## 2022-03-02 DIAGNOSIS — Z841 Family history of disorders of kidney and ureter: Secondary | ICD-10-CM | POA: Diagnosis not present

## 2022-03-02 DIAGNOSIS — T402X5A Adverse effect of other opioids, initial encounter: Secondary | ICD-10-CM | POA: Diagnosis present

## 2022-03-02 DIAGNOSIS — Z9081 Acquired absence of spleen: Secondary | ICD-10-CM | POA: Diagnosis not present

## 2022-03-02 DIAGNOSIS — D5700 Hb-SS disease with crisis, unspecified: Secondary | ICD-10-CM | POA: Diagnosis not present

## 2022-03-02 DIAGNOSIS — E876 Hypokalemia: Secondary | ICD-10-CM | POA: Diagnosis present

## 2022-03-02 DIAGNOSIS — F419 Anxiety disorder, unspecified: Secondary | ICD-10-CM | POA: Diagnosis present

## 2022-03-02 DIAGNOSIS — R112 Nausea with vomiting, unspecified: Secondary | ICD-10-CM | POA: Diagnosis present

## 2022-03-02 DIAGNOSIS — G894 Chronic pain syndrome: Secondary | ICD-10-CM | POA: Diagnosis present

## 2022-03-02 DIAGNOSIS — F17210 Nicotine dependence, cigarettes, uncomplicated: Secondary | ICD-10-CM | POA: Diagnosis present

## 2022-03-02 DIAGNOSIS — F1120 Opioid dependence, uncomplicated: Secondary | ICD-10-CM | POA: Diagnosis present

## 2022-03-02 DIAGNOSIS — D638 Anemia in other chronic diseases classified elsewhere: Secondary | ICD-10-CM | POA: Diagnosis present

## 2022-03-02 DIAGNOSIS — Z8261 Family history of arthritis: Secondary | ICD-10-CM | POA: Diagnosis not present

## 2022-03-02 DIAGNOSIS — Z8249 Family history of ischemic heart disease and other diseases of the circulatory system: Secondary | ICD-10-CM | POA: Diagnosis not present

## 2022-03-02 DIAGNOSIS — F329 Major depressive disorder, single episode, unspecified: Secondary | ICD-10-CM | POA: Diagnosis present

## 2022-03-02 DIAGNOSIS — Z832 Family history of diseases of the blood and blood-forming organs and certain disorders involving the immune mechanism: Secondary | ICD-10-CM | POA: Diagnosis not present

## 2022-03-02 LAB — BASIC METABOLIC PANEL
Anion gap: 6 (ref 5–15)
BUN: 10 mg/dL (ref 6–20)
CO2: 24 mmol/L (ref 22–32)
Calcium: 8.9 mg/dL (ref 8.9–10.3)
Chloride: 108 mmol/L (ref 98–111)
Creatinine, Ser: 0.54 mg/dL (ref 0.44–1.00)
GFR, Estimated: >60 mL/min (ref >60)
Glucose, Bld: 102 mg/dL — ABNORMAL HIGH (ref 70–99)
Potassium: 3.4 mmol/L — ABNORMAL LOW (ref 3.5–5.1)
Sodium: 138 mmol/L (ref 135–145)

## 2022-03-02 LAB — CBC
HCT: 26.8 % — ABNORMAL LOW (ref 36.0–46.0)
Hemoglobin: 9.6 g/dL — ABNORMAL LOW (ref 12.0–15.0)
MCH: 30.1 pg (ref 26.0–34.0)
MCHC: 35.8 g/dL (ref 30.0–36.0)
MCV: 84 fL (ref 80.0–100.0)
Platelets: 484 10*3/uL — ABNORMAL HIGH (ref 150–400)
RBC: 3.19 MIL/uL — ABNORMAL LOW (ref 3.87–5.11)
RDW: 13.9 % (ref 11.5–15.5)
WBC: 9.8 10*3/uL (ref 4.0–10.5)
nRBC: 0.5 % — ABNORMAL HIGH (ref 0.0–0.2)

## 2022-03-02 MED ORDER — HYDROMORPHONE 1 MG/ML IV SOLN
INTRAVENOUS | Status: DC
  Administered 2022-03-02: 7.5 mg via INTRAVENOUS
  Administered 2022-03-02: 30 mg via INTRAVENOUS
  Administered 2022-03-02: 3 mg via INTRAVENOUS
  Administered 2022-03-02: 8 mg via INTRAVENOUS
  Filled 2022-03-02: qty 30

## 2022-03-02 MED ORDER — ONDANSETRON HCL 4 MG/2ML IJ SOLN
4.0000 mg | Freq: Four times a day (QID) | INTRAMUSCULAR | Status: DC | PRN
  Administered 2022-03-02 – 2022-03-03 (×2): 4 mg via INTRAVENOUS
  Filled 2022-03-02 (×2): qty 2

## 2022-03-02 MED ORDER — NALOXONE HCL 0.4 MG/ML IJ SOLN: 0.4000 mg | INTRAMUSCULAR | Status: DC | PRN

## 2022-03-02 MED ORDER — ONDANSETRON HCL 4 MG/2ML IJ SOLN
4.0000 mg | Freq: Once | INTRAMUSCULAR | Status: AC
  Administered 2022-03-02: 4 mg via INTRAVENOUS
  Filled 2022-03-02: qty 2

## 2022-03-02 MED ORDER — HYDROMORPHONE 1 MG/ML IV SOLN
INTRAVENOUS | Status: DC
  Administered 2022-03-02: 7.11 mg via INTRAVENOUS
  Administered 2022-03-02: 2.4 mg via INTRAVENOUS
  Administered 2022-03-03: 3 mg via INTRAVENOUS
  Administered 2022-03-03: 3.6 mg via INTRAVENOUS
  Administered 2022-03-03: 4.8 mg via INTRAVENOUS
  Administered 2022-03-03: 2.4 mg via INTRAVENOUS
  Administered 2022-03-03: 6.6 mg via INTRAVENOUS
  Administered 2022-03-03: 12 mg via INTRAVENOUS
  Administered 2022-03-03: 9 mg via INTRAVENOUS
  Administered 2022-03-04: 4.8 mg via INTRAVENOUS
  Administered 2022-03-04: 3 mg via INTRAVENOUS
  Administered 2022-03-04: 30 mg via INTRAVENOUS
  Administered 2022-03-04: 0.1 mg via INTRAVENOUS
  Administered 2022-03-04: 8.4 mg via INTRAVENOUS
  Administered 2022-03-04: 10.2 mg via INTRAVENOUS
  Administered 2022-03-05: 8.5 mg via INTRAVENOUS
  Administered 2022-03-05: 5.4 mg via INTRAVENOUS
  Filled 2022-03-02 (×4): qty 30

## 2022-03-02 MED ORDER — POLYETHYLENE GLYCOL 3350 17 G PO PACK
17.0000 g | PACK | Freq: Every day | ORAL | Status: DC
  Administered 2022-03-02 – 2022-03-05 (×4): 17 g via ORAL
  Filled 2022-03-02 (×4): qty 1

## 2022-03-02 MED ORDER — SODIUM CHLORIDE 0.9% FLUSH: 9.0000 mL | INTRAVENOUS | Status: DC | PRN

## 2022-03-02 NOTE — Progress Notes (Signed)
Pt remained in restroom as RN tech tried to get 2000 vitals.  ?

## 2022-03-02 NOTE — Progress Notes (Signed)
Subjective: ?Carmen [name] is a 31 year old female with a medical history significant for sickle cell disease, chronic pain syndrome, history of depression, opiate dependence and tolerance, and history of anemia of chronic disease was admitted for sickle cell pain crisis. ?Today, patient's pain intensity is 10/10 characterized as constant and throbbing.  Patient states that pain is primarily to low back.  Patient endorses nausea today. ?She denies headache, chest pain, urinary symptoms, ,vomiting, or diarrhea. ?Objective: ? ?Vital signs in last 24 hours: ? ?Vitals:  ? 03/02/22 0146 03/02/22 0728 03/02/22 0944 03/02/22 1200  ?BP: 112/77  106/69   ?Pulse: 62  61   ?Resp: '16 14 14   '$ ?Temp: 97.6 ?F (36.4 ?C)  97.7 ?F (36.5 ?C)   ?TempSrc: Oral  Axillary   ?SpO2: 99%  95% 94%  ? ? ?Intake/Output from previous day: ? ? ?Intake/Output Summary (Last 24 hours) at 03/02/2022 1339 ?Last data filed at 03/02/2022 0330 ?Gross per 24 hour  ?Intake 1189.12 ml  ?Output --  ?Net 1189.12 ml  ? ? ?Physical Exam: ?General: Alert, awake, oriented x3, in no acute distress.  ?HEENT: [place]/AT PEERL, EOMI ?Neck: Trachea midline,  no masses, no thyromegal,y no JVD, no carotid bruit ?OROPHARYNX:  Moist, No exudate/ erythema/lesions.  ?Heart: Regular rate and rhythm, without murmurs, rubs, gallops, PMI non-displaced, no heaves or thrills on palpation.  ?Lungs: Clear to auscultation, no wheezing or rhonchi noted. No increased vocal fremitus resonant to percussion  ?Abdomen: Soft, nontender, nondistended, positive bowel sounds, no masses no hepatosplenomegaly noted.Marland Kitchen  ?Neuro: No focal neurological deficits noted cranial nerves II through XII grossly intact. DTRs 2+ bilaterally upper and lower extremities. Strength 5 out of 5 in bilateral upper and lower extremities. ?Musculoskeletal: No warm swelling or erythema around joints, no spinal tenderness noted. ?Psychiatric: Patient alert and oriented x3, good insight and cognition, good recent to remote  recall. ?Lymph node survey: No cervical axillary or inguinal lymphadenopathy noted. ? ?Lab Results: ? ?Basic Metabolic Panel: ?   ?Component Value Date/Time  ? NA 138 03/02/2022 0909  ? NA 142 07/02/2020 1040  ? K 3.4 (L) 03/02/2022 0909  ? CL 108 03/02/2022 0909  ? CO2 24 03/02/2022 0909  ? BUN 10 03/02/2022 0909  ? BUN 8 07/02/2020 1040  ? CREATININE 0.54 03/02/2022 0909  ? GLUCOSE 102 (H) 03/02/2022 0909  ? CALCIUM 8.9 03/02/2022 0909  ? ?CBC: ?   ?Component Value Date/Time  ? WBC 9.8 03/02/2022 0909  ? HGB 9.6 (L) 03/02/2022 0909  ? HGB 9.8 (L) 07/02/2020 1040  ? HCT 26.8 (L) 03/02/2022 0909  ? HCT 28.9 (L) 07/02/2020 1040  ? PLT 484 (H) 03/02/2022 0909  ? PLT 628 (H) 07/02/2020 1040  ? MCV 84.0 03/02/2022 0909  ? MCV 88 07/02/2020 1040  ? NEUTROABS 4.4 03/01/2022 1430  ? NEUTROABS 3.0 07/02/2020 1040  ? LYMPHSABS 4.0 03/01/2022 1430  ? LYMPHSABS 3.5 (H) 07/02/2020 1040  ? MONOABS 0.9 03/01/2022 1430  ? EOSABS 0.3 03/01/2022 1430  ? EOSABS 0.2 07/02/2020 1040  ? BASOSABS 0.0 03/01/2022 1430  ? BASOSABS 0.1 07/02/2020 1040  ? ? ?No results found for this or any previous visit (from the past 240 hour(s)). ? ?Studies/Results: ?DG Chest 2 View ? ?Result Date: 03/01/2022 ?CLINICAL DATA:  Shortness of breath. History of sickle cell disease. EXAM: CHEST - 2 VIEW COMPARISON:  10/31/2021 FINDINGS: The cardiac silhouette, mediastinal and hilar contours are normal. The lungs are clear. No pleural effusions. No pulmonary lesions. The bony thorax  is intact. IMPRESSION: No acute cardiopulmonary findings. Electronically Signed   By: Marijo Sanes M.D.   On: 03/01/2022 12:17   ? ?Medications: ?Scheduled Meds: ? citalopram  20 mg Oral q morning  ? enoxaparin (LOVENOX) injection  40 mg Subcutaneous W80H  ? folic acid  1 mg Oral Daily  ? HYDROmorphone   Intravenous Q4H  ? morphine  60 mg Oral Q12H  ? ?Continuous Infusions: ? sodium chloride 125 mL/hr at 03/02/22 0330  ? ?PRN Meds:.acetaminophen **OR** acetaminophen, albuterol,  diphenhydrAMINE, hydrALAZINE, naloxone **AND** sodium chloride flush, ondansetron, oxyCODONE ? ?Consultants: ?None ? ?Procedures: ?None ? ?Antibiotics: ?None ? ?Assessment/Plan: ?Principal Problem: ?  Sickle cell pain crisis ([place]) ?Active Problems: ?  Anemia of chronic disease ?  Hypokalemia ? ?Sickle cell disease with pain crisis: ?Patient's pain persists despite current treatment.  Increase settings of Dilaudid PCA ?Oxycodone 10 mg every 4 hours as needed for severe pain ?Toradol 15 mg IV every 6 hours ?Decrease IV fluids to KVO ?Monitor vital signs very closely, reevaluate pain scale regularly, and supplemental oxygen as needed. ? ?Chronic pain syndrome: ?Continue home medications ? ?History of depression: ?Stable.  No suicidal or homicidal intentions on today.  Continue home medications. ? ?Anemia of chronic disease: ?Hemoglobin is stable and consistent with patient's baseline.  There is no clinical indication for blood transfusion at this time.  Continue to follow closely. ? ? ?Code Status: Full Code ?Family Communication: N/A ?Disposition Plan: Not yet ready for discharge ? ?[name]  APRN, MSN, FNP-C ?[facility] [place] ?[facility] ?[address]  ?[place], [place] [address] ?[phone] ? ?If 7PM-7AM, please contact night-coverage. ? ?03/02/2022, 1:39 PM  LOS: 0 days   ?

## 2022-03-02 NOTE — Progress Notes (Addendum)
Subjective: ?Carmen [name] is a 31 year old female with a medical history significant for sickle cell disease, chronic pain syndrome, opiate dependence and tolerance, major depression, and history of anemia of chronic disease was admitted to sickle cell crisis. ?Patient reports significant pain primarily to low back.  She also endorses constipation.  She says that she has not been able to have a bowel movement over the past 4 days.  Patient also reports some nausea and one episode of vomiting.  She currently denies any dizziness, headache, shortness of breath, or urinary symptoms. ? ?Objective: ? ?Vital signs in last 24 hours: ? ?Vitals:  ? 03/03/22 0605 03/03/22 0725 03/03/22 1159 03/03/22 1500  ?BP: 104/64   104/64  ?Pulse: 66   66  ?Resp: '17 10 10 10  '$ ?Temp: 97.7 ?F (36.5 ?C)   97.7 ?F (36.5 ?C)  ?TempSrc: Oral   Oral  ?SpO2: 96% 95% 96%   ?Weight:    86.2 kg  ?Height:    '5\' 3"'$  (1.6 m)  ? ? ?Intake/Output from previous day: ? ?No intake or output data in the 24 hours ending 03/03/22 1633 ? ?Physical Exam: ?General: Alert, awake, oriented x3, in no acute distress.  ?HEENT: [place]/AT PEERL, EOMI ?Neck: Trachea midline,  no masses, no thyromegal,y no JVD, no carotid bruit ?OROPHARYNX:  Moist, No exudate/ erythema/lesions.  ?Heart: Regular rate and rhythm, without murmurs, rubs, gallops, PMI non-displaced, no heaves or thrills on palpation.  ?Lungs: Clear to auscultation, no wheezing or rhonchi noted. No increased vocal fremitus resonant to percussion  ?Abdomen: Soft, nontender, nondistended, positive bowel sounds, no masses no hepatosplenomegaly noted.Marland Kitchen  ?Neuro: No focal neurological deficits noted cranial nerves II through XII grossly intact. DTRs 2+ bilaterally upper and lower extremities. Strength 5 out of 5 in bilateral upper and lower extremities. ?Musculoskeletal: No warm swelling or erythema around joints, no spinal tenderness noted. ?Psychiatric: Patient alert and oriented x3, good insight and cognition, good  recent to remote recall. ?Lymph node survey: No cervical axillary or inguinal lymphadenopathy noted. ? ?Lab Results: ? ?Basic Metabolic Panel: ?   ?Component Value Date/Time  ? NA 138 03/02/2022 0909  ? NA 142 07/02/2020 1040  ? K 3.4 (L) 03/02/2022 0909  ? CL 108 03/02/2022 0909  ? CO2 24 03/02/2022 0909  ? BUN 10 03/02/2022 0909  ? BUN 8 07/02/2020 1040  ? CREATININE 0.54 03/02/2022 0909  ? GLUCOSE 102 (H) 03/02/2022 0909  ? CALCIUM 8.9 03/02/2022 0909  ? ?CBC: ?   ?Component Value Date/Time  ? WBC 9.8 03/02/2022 0909  ? HGB 9.6 (L) 03/02/2022 0909  ? HGB 9.8 (L) 07/02/2020 1040  ? HCT 26.8 (L) 03/02/2022 0909  ? HCT 28.9 (L) 07/02/2020 1040  ? PLT 484 (H) 03/02/2022 0909  ? PLT 628 (H) 07/02/2020 1040  ? MCV 84.0 03/02/2022 0909  ? MCV 88 07/02/2020 1040  ? NEUTROABS 4.4 03/01/2022 1430  ? NEUTROABS 3.0 07/02/2020 1040  ? LYMPHSABS 4.0 03/01/2022 1430  ? LYMPHSABS 3.5 (H) 07/02/2020 1040  ? MONOABS 0.9 03/01/2022 1430  ? EOSABS 0.3 03/01/2022 1430  ? EOSABS 0.2 07/02/2020 1040  ? BASOSABS 0.0 03/01/2022 1430  ? BASOSABS 0.1 07/02/2020 1040  ? ? ?No results found for this or any previous visit (from the past 240 hour(s)). ? ?Studies/Results: ?DG Abd 1 View ? ?Result Date: 03/03/2022 ?CLINICAL DATA:  Constipation EXAM: ABDOMEN - 1 VIEW COMPARISON:  None Available. FINDINGS: Bowel gas pattern is normal. Moderate stool burden. No radio-opaque calculi or other  significant radiographic abnormality are seen. IMPRESSION: Nonobstructive bowel gas pattern. Electronically Signed   By: Yetta Glassman M.D.   On: 03/03/2022 13:47   ? ?Medications: ?Scheduled Meds: ? citalopram  20 mg Oral q morning  ? enoxaparin (LOVENOX) injection  40 mg Subcutaneous Z16R  ? folic acid  1 mg Oral Daily  ? HYDROmorphone   Intravenous Q4H  ? morphine  60 mg Oral Q12H  ? polyethylene glycol  17 g Oral Daily  ? ?Continuous Infusions: ?PRN Meds:.acetaminophen **OR** acetaminophen, albuterol, diphenhydrAMINE, hydrALAZINE, naloxone **AND** sodium  chloride flush, oxyCODONE, prochlorperazine ? ?Consultants: ?None ? ?Procedures: ?None ? ?Antibiotics: ?None ? ?Assessment/Plan: ?Principal Problem: ?  Sickle cell pain crisis ([place]) ?Active Problems: ?  Anemia of chronic disease ?  Constipation due to pain medication ?  Hypokalemia ?  Nausea & vomiting ? ?Sickle cell disease with pain crisis: ?Continue IV Dilaudid PCA, no changes in settings today ?Oxycodone 10 mg every 6 hours as needed for severe breakthrough pain ?Toradol 15 mg every 15 mg every 6 hours ?Monitor vital signs very closely, reevaluate pain scale regularly, and supplemental oxygen as needed ? ?Constipation: ?Patient is complaining of increased constipation.  More than likely secondary to high opiate use.  Bowel regimen initiated on yesterday, and effective.  Lactulose today.  Review KUB as results become available.  Will consider adding Movantik for opiate induced constipation. ? ?Nausea/vomiting: ?Advance diet as tolerated.  Antiemetics as needed. ? ?Chronic pain syndrome: ?Continue home medications ? ?History of depression: ?Stable.  Continue home medications.  No suicidal or homicidal intentions on today. ? ?Anemia of chronic disease: ?Hemoglobin stable and consistent with patient's baseline.  There is no clinical indication for blood transfusion today.  Continue to monitor closely. ? ? ? ?Code Status: Full Code ?Family Communication: N/A ?Disposition Plan: Not yet ready for discharge ? ? ?[name]  APRN, MSN, FNP-C ?[facility] [place] ?[facility] ?[address]  ?[place], [place] [address] ?[phone] ? ?If 7PM-7AM, please contact night-coverage. ? ?03/03/2022, 4:33 PM  LOS: 1 day   ?

## 2022-03-02 NOTE — Progress Notes (Signed)
Pt has been in the bathroom, stating she is constipated. Pt refused AM labs and AM vitals. Also unable to fix tele leads.  ?

## 2022-03-02 NOTE — Progress Notes (Signed)
Notified attending MD that patient continues to refuse vital signs, labs, and telemetry monitoring. Does currently have on Etco2 for use of PCA pump.  ?

## 2022-03-03 ENCOUNTER — Inpatient Hospital Stay (HOSPITAL_COMMUNITY): Payer: Medicaid Other

## 2022-03-03 DIAGNOSIS — R112 Nausea with vomiting, unspecified: Secondary | ICD-10-CM

## 2022-03-03 MED ORDER — LACTULOSE 10 GM/15ML PO SOLN
20.0000 g | Freq: Two times a day (BID) | ORAL | Status: AC
  Administered 2022-03-03: 20 g via ORAL
  Filled 2022-03-03: qty 30

## 2022-03-03 MED ORDER — PROCHLORPERAZINE EDISYLATE 10 MG/2ML IJ SOLN
10.0000 mg | Freq: Four times a day (QID) | INTRAMUSCULAR | Status: DC | PRN
  Administered 2022-03-03: 10 mg via INTRAVENOUS
  Filled 2022-03-03: qty 2

## 2022-03-03 NOTE — Progress Notes (Addendum)
0715 RN to room for bedside shift report. Pt turned off EtCO2 monitoring system. Informed pt that it needs to be kept on for monitoring of resp status while receiving PCA pump. RN turned back on and placed [place] monitor in patient's nares. Pt c/o pain 9/10. Encouraged PCA use. Pt assessed, see flowsheet, no needs at this time, WCTM.  ? ?69 RN to patient's room, pump alarming with low respirations. RN aroused patient and encouraged deep breaths. RN also set up continuous pulse ox monitor. Explained and educated pt why it was being used. Pt stated that [place], NP stated she didn't need it. Pt informed this was protocol. WCTM.  ? ?66 RN into patient's room, alarm ringing, pt's resp again low, resp 6, aroused pt. O2 monitor also alarming, finger probe off. RN replaced O2 finger probe. WCTM.  ? ?0830 Pt called RN to room to re-tap IV down. RN assessed monitoring equipment and intact. WCTM.  ? ?1000 RN into room to medicate pt. Pt sitting on toilet, states she's constipated. Miralax given. WCTM.  ? ?1200 RN to room to check on patient, pt again on toilet, will address constipation when provider rounds.  ? ?1420 Pt sitting up on sofa in room eating lunch. NAD, lactulose given. WCTM.  ? ?1500 Pt unhooked O2 monitor. Stating she can't eat lunch with the cord in the way. RN stated that we need to monitor her O2 sats while on the PCA pump. Pt still refusing O2 while eating. RN informed patient that the PCA will then be turned off and restarted after she finishes lunch.  ? ?1630 RN in to check on patient. ETCo2 monitor again turned off (3rd time today). RN again emphasized the importance of this monitoring as patient respirations have been low most of the day. Pt stated she is aware of why she is being monitored. RN stated that if the monitoring equipment is turned off or disconnected the provider will be called. Pt stated that she is "professional" and hasn't touched any of the pumps or equipment today. RN asked why this was  the 3rd time the equipment had been turned off and she stated it must have been the NT. RN asked NT if she touched pumps/equipment and NT denied.  ? ?1645 RN to room as O2 monitor alarming. Pulse Ox had slipped off finger. RN replaced. Pt being verbally abusive to RN, pt asked to speak to Charge RN and asked for another RN. Per Director, Rollene Fare, she will still be the RN's pt. ? ?27 RN spoke to [place], NP, confirmed with RN that patient needs to be closely monitored. Verbally informed RN that if equipment is not intact to DC PCA pump. Pt informed and RN WCTM.  ? ?

## 2022-03-04 DIAGNOSIS — D5700 Hb-SS disease with crisis, unspecified: Secondary | ICD-10-CM | POA: Diagnosis not present

## 2022-03-04 NOTE — TOC Initial Note (Signed)
Transition of Care (TOC) - Initial/Assessment Note  ? ? ?Patient Details  ?Name: Carmen [name] ?MRN: [id number] ?Date of Birth: January 03, 1991 ? ?Transition of Care (TOC) CM/SW Contact:    ?Adyen Bifulco, Marjie Skiff, RN ?Phone Number: ?03/04/2022, 2:04 PM ? ?Clinical Narrative:                 ? ? ?Expected Discharge Plan: Home/Self Care ?Barriers to Discharge: Continued Medical Work up ? ?  ? ?Expected Discharge Plan and Services ?Expected Discharge Plan: Home/Self Care ?  ?Discharge Planning Services: CM Consult ?  ?Living arrangements for the past 2 months: Apartment ?                ?  ?Prior Living Arrangements/Services ?Living arrangements for the past 2 months: Apartment ?Lives with:: Parents ?Patient language and need for interpreter reviewed:: Yes ?       ?Need for Family Participation in Patient Care: No (Comment) ?  ?  ?Criminal Activity/Legal Involvement Pertinent to Current Situation/Hospitalization: No - Comment as needed ? ?Activities of Daily Living ?Home Assistive Devices/Equipment: None ?ADL Screening (condition at time of admission) ?Patient's cognitive ability adequate to safely complete daily activities?: Yes ?Is the patient deaf or have difficulty hearing?: No ?Does the patient have difficulty seeing, even when wearing glasses/contacts?: No ?Does the patient have difficulty concentrating, remembering, or making decisions?: No ?Patient able to express need for assistance with ADLs?: Yes ?Does the patient have difficulty dressing or bathing?: No ?Independently performs ADLs?: Yes (appropriate for developmental age) ?Does the patient have difficulty walking or climbing stairs?: No ?Weakness of Legs: None ?Weakness of Arms/Hands: None ? ? ?Emotional Assessment ?Appearance:: Appears stated age ?  ?  ?Orientation: : Oriented to Self, Oriented to Place, Oriented to  Time, Oriented to Situation ?Alcohol / Substance Use: Not Applicable ?  ? ?Admission diagnosis:  Sickle cell pain crisis ([place]) [D57.00] ?Patient Active  Problem List  ? Diagnosis Date Noted  ? Nausea & vomiting 03/03/2022  ? Acute chest syndrome due to sickle cell crisis ([place]) 09/05/2021  ? Chronic pain syndrome 08/21/2021  ? Moderate episode of recurrent major depressive disorder ([place]) 05/28/2021  ? Major depressive disorder 05/22/2021  ? Tobacco abuse 05/21/2021  ? Sickle cell disease with crisis ([place]) 03/23/2021  ? Acute pain of right shoulder   ? Sickle cell anemia with crisis ([place]) 02/16/2021  ? Community acquired pneumonia of right lower lobe of lung   ? Fever of unknown origin 10/14/2020  ? Sickle cell crisis ([place]) 03/31/2020  ? Bacterial vaginitis 01/04/2020  ? Acute cystitis without hematuria   ? Dysuria 12/31/2019  ? Urine leukocytes   ? Sickle cell pain crisis ([place]) 09/10/2019  ? Sickle cell disease ([place]) 12/04/2018  ? Alpha thalassemia silent carrier 12/04/2018  ? Chronic prescription opiate use 03/13/2018  ? Chronic musculoskeletal pain 03/13/2018  ? Vitamin D deficiency 01/13/2018  ? Leukocytosis 08/21/2017  ? Hypokalemia 06/27/2017  ? 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 Constipation due to pain medication   ? Hb-SS disease without crisis ([place]) 08/15/2016  ? Anemia of chronic disease   ? Chest pain 11/10/2015  ? Thrombocytosis 11/22/2014  ? Systolic ejection murmur 93/26/7124  ? Sickle cell anemia of mother during pregnancy [facility]) 05/16/2014  ? ?PCP:  Fenton Foy, NP ?Pharmacy:   ?[id number] [place] [facility] [address]?[place] [address]?Phone: [phone]: [phone]? ? ? ? ?  Social Determinants of Health (SDOH) Interventions ?  ? ?Readmission Risk Interventions ? ?  03/04/2022  ?  2:02 PM 02/03/2022  ?  9:08 AM 01/01/2022  ?  3:30 PM  ?Readmission Risk Prevention Plan  ?Transportation Screening Complete Complete Complete  ?Medication Review Press photographer) Complete Complete Complete  ?PCP or Specialist appointment within 3-5 days of  discharge Complete Complete Complete  ?[place] or Home Care Consult Complete Not Complete Complete  ?SW Recovery Care/Counseling Consult Complete Not Complete Complete  ?Palliative Care Screening Not Applicable Not Applicable Not Applicable  ?[place] Not Applicable Not Applicable Not Applicable  ? ? ? ?

## 2022-03-05 ENCOUNTER — Encounter: Payer: Self-pay | Admitting: Family Medicine

## 2022-03-05 ENCOUNTER — Other Ambulatory Visit: Payer: Self-pay | Admitting: Family Medicine

## 2022-03-05 ENCOUNTER — Telehealth: Payer: Self-pay

## 2022-03-05 DIAGNOSIS — D5700 Hb-SS disease with crisis, unspecified: Secondary | ICD-10-CM | POA: Diagnosis not present

## 2022-03-05 MED ORDER — OXYCODONE HCL 5 MG PO TABS: 10.0000 mg | ORAL_TABLET | Freq: Four times a day (QID) | ORAL | Status: DC

## 2022-03-05 MED ORDER — MORPHINE SULFATE ER 60 MG PO TBCR: 60.0000 mg | EXTENDED_RELEASE_TABLET | Freq: Two times a day (BID) | ORAL | 0 refills | Status: AC

## 2022-03-05 MED ORDER — OXYCODONE HCL 10 MG PO TABS: 10.0000 mg | ORAL_TABLET | Freq: Four times a day (QID) | ORAL | 0 refills | Status: AC | PRN

## 2022-03-05 MED ORDER — HYDROMORPHONE 1 MG/ML IV SOLN: INTRAVENOUS | Status: DC

## 2022-03-05 NOTE — Discharge Summary (Signed)
?Physician Discharge Summary  ?Carmen [name] DZH:[id number] DOB: 08-07-1991 DOA: 03/01/2022 ? ?PCP: Fenton Foy, NP ? ?Admit date: 03/01/2022 ? ?Discharge date: 03/05/2022 ? ?Discharge Diagnoses:  ?Principal Problem: ?  Sickle cell pain crisis ([place]) ?Active Problems: ?  Anemia of chronic disease ?  Constipation due to pain medication ?  Hypokalemia ?  Nausea & vomiting ? ? ?Discharge Condition: Stable ? ?Disposition:  ?Pt is discharged home in good condition and is to follow up with Fenton Foy, NP this week to have labs evaluated. Carmen [name] is instructed to increase activity slowly and balance with rest for the next few days, and use prescribed medication to complete treatment of pain ? ?Diet: Regular ?Wt Readings from Last 3 Encounters:  ?03/03/22 86.2 kg  ?02/01/22 83.9 kg  ?12/31/21 86.2 kg  ? ? ?History of present illness:  ?Carmen [name] is a 31 year old female with a medical history significant for sickle cell disease, chronic pain syndrome, opiate dependence and tolerance, anemia of chronic disease, and major depression presented to the emergency department this morning with primary complaint of generalized pain for several days with no relief from home medications. ?Patient states that she has had multiple sickle cell crises throughout her life.  She has never needed intubation.  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  She has required multiple courses of antibiotics to her lifetime.  She states that she has tried to avoid opiates.  The patient is on chronic opiates.  Typically her pain crises are triggered by mental stress and cold weather.  She states that her job is physically and mentally stressful.  Her last hospitalization was last month 4/3-4/6, which she required Dilaudid PCA. ?Her pain started last week.  She went to her PCP and was prescribed morphine 60 mg twice daily and oxycodone 10 mg as needed, despite which her symptoms did not improve and hence she presented to the emergency  department. ? ?In the ED, she was hemodynamically stable and able to breathe on room air.  Because of significant pain, she required several doses of IV Dilaudid throughout the day and still continues to have pain.  Hospitalist service consulted for inpatient admission and management. ?She does not have any symptoms or signs of infection.  No fever, WBC count normal, chest x-ray without any infiltrates. ?At the time of evaluation, patient was sitting up in bed.  Not in distress.  Pain controlled with the last dose of IV Dilaudid. ?History details as above. ? ?Hospital Course:  ?Sickle cell disease with pain crisis: ?Patient was admitted for sickle cell pain crisis and managed appropriately with IVF, IV Dilaudid via PCA and IV Toradol, as well as other adjunct therapies per sickle cell pain management protocols.  Patient's home medications were resumed during admission.  Currently, her pain intensity is 5/10 and she is requesting discharge home.  She feels that she can manage at home on current medication regimen.  Patient's home pain medications were previously written by her PCP, however her usual pharmacy does not have medications in stock.  Morphine 60 mg every 12 hours #60 and oxycodone 10 mg every 6 #60 was sent to patient's pharmacy.  PDMP reviewed prior to prescribing opiate medications, no inconsistencies were noted. ?Opiate-induced constipation: ?KUB showed moderate stool burden.  Patient had bowel movement prior to discharge.  Recommend Movantik for opiate-induced constipation. ? ?Patient is alert, oriented, and ambulating without assistance ?Patient was therefore discharged home today in a hemodynamically stable condition.  ? ?  Carmen [name] will follow-up with PCP within 1 week of this discharge. Carmen [name] was counseled extensively about nonpharmacologic means of pain management, patient verbalized understanding and was appreciative of  the care received during this admission.  ? ?We discussed the need for good  hydration, monitoring of hydration status, avoidance of heat, cold, stress, and infection triggers. We discussed the need to be adherent with taking Hydrea and other home medications. Patient was reminded of the need to seek medical attention immediately if any symptom of bleeding, anemia, or infection occurs. ? ?Discharge Exam: ?Vitals:  ? 03/05/22 0952 03/05/22 1005  ?BP:  (!) 112/96  ?Pulse:  91  ?Resp: 18 14  ?Temp:  98.3 ?F (36.8 ?C)  ?SpO2: 95% 99%  ? ?Vitals:  ? 03/05/22 0437 03/05/22 0800 03/05/22 0952 03/05/22 1005  ?BP: 123/85   (!) 112/96  ?Pulse: 92   91  ?Resp: '20 18 18 14  '$ ?Temp: 99.5 ?F (37.5 ?C)   98.3 ?F (36.8 ?C)  ?TempSrc: Oral   Oral  ?SpO2: 98% 96% 95% 99%  ?Weight:      ?Height:      ? ? ?General appearance : Awake, alert, not in any distress. Speech Clear. Not toxic looking ?HEENT: Atraumatic and Normocephalic, pupils equally reactive to light and accomodation ?Neck: Supple, no JVD. No cervical lymphadenopathy.  ?Chest: Good air entry bilaterally, no added sounds  ?CVS: S1 S2 regular, no murmurs.  ?Abdomen: Bowel sounds present, Non tender and not distended with no gaurding, rigidity or rebound. ?Extremities: B/L Lower Ext shows no edema, both legs are warm to touch ?Neurology: Awake alert, and oriented X 3, CN II-XII intact, Non focal ?Skin: No Rash ? ?Discharge Instructions ? ?Discharge Instructions   ? ? Discharge patient   Complete by: As directed ?  ? Discharge disposition: 01-Home or Self Care  ? Discharge patient date: 03/05/2022  ? ?  ? ?Allergies as of 03/05/2022   ? ?   Reactions  ? Ketamine Hives, Other (See Comments)  ? Makes feel funny  ? ?  ? ?  ?Medication List  ?  ? ?TAKE these medications   ? ?betamethasone valerate ointment 0.1 % ?Commonly known as: VALISONE ?APPLY TO AFFECTED AREA TWICE A DAY ?Strength: 0.1 % ?What changed:  ?how much to take ?when to take this ?reasons to take this ?additional instructions ?  ?citalopram 20 MG tablet ?Commonly known as: CELEXA ?TAKE 1 TABLET BY  MOUTH EVERY DAY ?What changed: when to take this ?  ?cyclobenzaprine 10 MG tablet ?Commonly known as: FLEXERIL ?TAKE 1 TABLET BY MOUTH TWICE A DAY ?  ?DULoxetine 30 MG capsule ?Commonly known as: CYMBALTA ?Take 2 capsules (60 mg total) by mouth daily. ?  ?folic acid 1 MG tablet ?Commonly known as: FOLVITE ?Take 1 tablet (1 mg total) by mouth daily. ?What changed: when to take this ?  ?morphine 60 MG 12 hr tablet ?Commonly known as: MS CONTIN ?Take 1 tablet (60 mg total) by mouth every 12 (twelve) hours. ?  ?Oxycodone HCl 10 MG Tabs ?Take 1 tablet (10 mg total) by mouth every 6 (six) hours as needed for up to 15 days (pain). ?  ?Prenatal Vitamin Plus Low Iron 27-1 MG Tabs ?Take 1 tablet by mouth every morning. ?  ?Vitamin D (Ergocalciferol) 1.25 MG (50000 UNIT) Caps capsule ?Commonly known as: DRISDOL ?TAKE ONE CAPSULE BY MOUTH ONE TIME PER WEEK ?What changed:  ?how much to take ?how to take this ?when to take this ?additional  instructions ?  ? ?  ? ? ?The results of significant diagnostics from this hospitalization (including imaging, microbiology, ancillary and laboratory) are listed below for reference.   ? ?Significant Diagnostic Studies: ?DG Chest 2 View ? ?Result Date: 03/01/2022 ?CLINICAL DATA:  Shortness of breath. History of sickle cell disease. EXAM: CHEST - 2 VIEW COMPARISON:  10/31/2021 FINDINGS: The cardiac silhouette, mediastinal and hilar contours are normal. The lungs are clear. No pleural effusions. No pulmonary lesions. The bony thorax is intact. IMPRESSION: No acute cardiopulmonary findings. Electronically Signed   By: Marijo Sanes M.D.   On: 03/01/2022 12:17  ? ?DG Abd 1 View ? ?Result Date: 03/03/2022 ?CLINICAL DATA:  Constipation EXAM: ABDOMEN - 1 VIEW COMPARISON:  None Available. FINDINGS: Bowel gas pattern is normal. Moderate stool burden. No radio-opaque calculi or other significant radiographic abnormality are seen. IMPRESSION: Nonobstructive bowel gas pattern. Electronically Signed   By: Yetta Glassman M.D.   On: 03/03/2022 13:47   ? ?Microbiology: ?No results found for this or any previous visit (from the past 240 hour(s)).  ? ?Labs: ?Basic Metabolic Panel: ?Recent Labs  ?Lab 03/01/22 ?1430 03/02/22 ?

## 2022-03-05 NOTE — Progress Notes (Signed)
Subjective: ?Carmen [name] is a 31 year old female with a medical history significant for sickle cell disease, chronic pain syndrome, opiate dependence and tolerance, major depression, and history of anemia of chronic disease was admitted to sickle cell crisis. ?Patient reports significant pain primarily to low back.   Patient also reports some nausea and one episode of vomiting.  She currently denies any dizziness, headache, shortness of breath, or urinary symptoms. ? ?Objective: ? ?Vital signs in last 24 hours: ? ?Vitals:  ? 03/04/22 2108 03/04/22 2332 03/05/22 0156 03/05/22 0326  ?BP: 111/67  113/79   ?Pulse: 86  91   ?Resp: '16 15 18 14  '$ ?Temp: 98.9 ?F (37.2 ?C)  98.1 ?F (36.7 ?C)   ?TempSrc: Oral  Oral   ?SpO2: 97% 99% 100% 96%  ?Weight:      ?Height:      ? ? ?Intake/Output from previous day: ? ? ?Intake/Output Summary (Last 24 hours) at 03/05/2022 0406 ?Last data filed at 03/04/2022 1516 ?Gross per 24 hour  ?Intake 480 ml  ?Output --  ?Net 480 ml  ? ? ?Physical Exam: ?General: Alert, awake, oriented x3, in no acute distress.  ?HEENT: [place]/AT PEERL, EOMI ?Neck: Trachea midline,  no masses, no thyromegal,y no JVD, no carotid bruit ?OROPHARYNX:  Moist, No exudate/ erythema/lesions.  ?Heart: Regular rate and rhythm, without murmurs, rubs, gallops, PMI non-displaced, no heaves or thrills on palpation.  ?Lungs: Clear to auscultation, no wheezing or rhonchi noted. No increased vocal fremitus resonant to percussion  ?Abdomen: Soft, nontender, nondistended, positive bowel sounds, no masses no hepatosplenomegaly noted.Marland Kitchen  ?Neuro: No focal neurological deficits noted cranial nerves II through XII grossly intact. DTRs 2+ bilaterally upper and lower extremities. Strength 5 out of 5 in bilateral upper and lower extremities. ?Musculoskeletal: No warm swelling or erythema around joints, no spinal tenderness noted. ?Psychiatric: Patient alert and oriented x3, good insight and cognition, good recent to remote recall. ?Lymph node  survey: No cervical axillary or inguinal lymphadenopathy noted. ? ?Lab Results: ? ?Basic Metabolic Panel: ?   ?Component Value Date/Time  ? NA 138 03/02/2022 0909  ? NA 142 07/02/2020 1040  ? K 3.4 (L) 03/02/2022 0909  ? CL 108 03/02/2022 0909  ? CO2 24 03/02/2022 0909  ? BUN 10 03/02/2022 0909  ? BUN 8 07/02/2020 1040  ? CREATININE 0.54 03/02/2022 0909  ? GLUCOSE 102 (H) 03/02/2022 0909  ? CALCIUM 8.9 03/02/2022 0909  ? ?CBC: ?   ?Component Value Date/Time  ? WBC 9.8 03/02/2022 0909  ? HGB 9.6 (L) 03/02/2022 0909  ? HGB 9.8 (L) 07/02/2020 1040  ? HCT 26.8 (L) 03/02/2022 0909  ? HCT 28.9 (L) 07/02/2020 1040  ? PLT 484 (H) 03/02/2022 0909  ? PLT 628 (H) 07/02/2020 1040  ? MCV 84.0 03/02/2022 0909  ? MCV 88 07/02/2020 1040  ? NEUTROABS 4.4 03/01/2022 1430  ? NEUTROABS 3.0 07/02/2020 1040  ? LYMPHSABS 4.0 03/01/2022 1430  ? LYMPHSABS 3.5 (H) 07/02/2020 1040  ? MONOABS 0.9 03/01/2022 1430  ? EOSABS 0.3 03/01/2022 1430  ? EOSABS 0.2 07/02/2020 1040  ? BASOSABS 0.0 03/01/2022 1430  ? BASOSABS 0.1 07/02/2020 1040  ? ? ?No results found for this or any previous visit (from the past 240 hour(s)). ? ?Studies/Results: ?DG Abd 1 View ? ?Result Date: 03/03/2022 ?CLINICAL DATA:  Constipation EXAM: ABDOMEN - 1 VIEW COMPARISON:  None Available. FINDINGS: Bowel gas pattern is normal. Moderate stool burden. No radio-opaque calculi or other significant radiographic abnormality are seen. IMPRESSION: Nonobstructive  bowel gas pattern. Electronically Signed   By: Yetta Glassman M.D.   On: 03/03/2022 13:47   ? ?Medications: ?Scheduled Meds: ? citalopram  20 mg Oral q morning  ? enoxaparin (LOVENOX) injection  40 mg Subcutaneous E72C  ? folic acid  1 mg Oral Daily  ? HYDROmorphone   Intravenous Q4H  ? morphine  60 mg Oral Q12H  ? polyethylene glycol  17 g Oral Daily  ? ?Continuous Infusions: ?PRN Meds:.acetaminophen **OR** acetaminophen, albuterol, diphenhydrAMINE, hydrALAZINE, naloxone **AND** sodium chloride flush, oxyCODONE,  prochlorperazine ? ?Consultants: ?None ? ?Procedures: ?None ? ?Antibiotics: ?None ? ?Assessment/Plan: ?Principal Problem: ?  Sickle cell pain crisis ([place]) ?Active Problems: ?  Anemia of chronic disease ?  Constipation due to pain medication ?  Hypokalemia ?  Nausea & vomiting ? ?Sickle cell disease with pain crisis: ?Continue IV Dilaudid PCA, no changes in settings today ?Oxycodone 10 mg every 6 hours as needed for severe breakthrough pain ?Toradol 15 mg every 15 mg every 6 hours ?Monitor vital signs very closely, reevaluate pain scale regularly, and supplemental oxygen as needed ? ?Constipation: ?Patient is complaining of increased constipation.  KUB shows moderate stool burden with a normal gas pattern. Continue bowel regimen ? ?Nausea/vomiting: ?Advance diet as tolerated.  Antiemetics as needed. ? ?Chronic pain syndrome: ?Continue home medications ? ?History of depression: ?Stable.  Continue home medications.  No suicidal or homicidal intentions on today. ? ?Anemia of chronic disease: ?Hemoglobin stable and consistent with patient's baseline.  There is no clinical indication for blood transfusion today.  Continue to monitor closely. ? ? ? ?Code Status: Full Code ?Family Communication: N/A ?Disposition Plan: Not yet ready for discharge ? ? ?[name]  APRN, MSN, FNP-C ?[facility] [place] ?[facility] ?[address]  ?[place], [place] [address] ?[phone] ? ?If 7PM-7AM, please contact night-coverage. ? ?03/05/2022, 4:06 AM  LOS: 3 days   ?

## 2022-03-05 NOTE — Telephone Encounter (Signed)
No additional notes needed  

## 2022-03-05 NOTE — Telephone Encounter (Signed)
Confirmation #:[id number] ?WBenefit  ?Plan:MCAIDHealth Plan:[id number] ? ?Prior Approval #:[id number] [place]  ? ?Type:PHARMACYRecipient:Carmen R SIMMONSRecipient ID:[id number] OBilling  ? ?Status:APPROVEDDrug Name/Code:OXY  ? ?TAB Drug Code Type:DRUG NAME Requested ?Provider:Billing Provider UY:[id number] Provider  ?[facility] [phone] ?Submission Date:05/05/2023Status:APPROVEDEffective Begin Date:05/05/2023Effective End Date:11/01/2023Payer:[place] [employer] HEALTH BENEFITS# of Attachments:1 ?

## 2022-03-05 NOTE — Telephone Encounter (Signed)
Prior Authorization started waiting for approval ?

## 2022-03-17 ENCOUNTER — Ambulatory Visit: Payer: Self-pay | Admitting: Nurse Practitioner

## 2022-03-22 ENCOUNTER — Ambulatory Visit: Payer: Medicaid Other

## 2022-03-28 ENCOUNTER — Other Ambulatory Visit: Payer: Self-pay

## 2022-03-28 ENCOUNTER — Encounter (HOSPITAL_COMMUNITY): Payer: Self-pay

## 2022-03-28 ENCOUNTER — Inpatient Hospital Stay (HOSPITAL_COMMUNITY)
Admission: EM | Admit: 2022-03-28 | Discharge: 2022-04-01 | DRG: 812 | Disposition: A | Discharge status: Home / Self Care | Payer: Medicaid Other | Attending: Internal Medicine | Admitting: Internal Medicine

## 2022-03-28 DIAGNOSIS — Z884 Allergy status to anesthetic agent status: Secondary | ICD-10-CM | POA: Diagnosis not present

## 2022-03-28 DIAGNOSIS — F609 Personality disorder, unspecified: Secondary | ICD-10-CM | POA: Diagnosis present

## 2022-03-28 DIAGNOSIS — D638 Anemia in other chronic diseases classified elsewhere: Secondary | ICD-10-CM | POA: Diagnosis present

## 2022-03-28 DIAGNOSIS — D5700 Hb-SS disease with crisis, unspecified: Secondary | ICD-10-CM | POA: Diagnosis present

## 2022-03-28 DIAGNOSIS — Z8249 Family history of ischemic heart disease and other diseases of the circulatory system: Secondary | ICD-10-CM

## 2022-03-28 DIAGNOSIS — G894 Chronic pain syndrome: Secondary | ICD-10-CM | POA: Diagnosis present

## 2022-03-28 DIAGNOSIS — F329 Major depressive disorder, single episode, unspecified: Secondary | ICD-10-CM | POA: Diagnosis present

## 2022-03-28 DIAGNOSIS — E875 Hyperkalemia: Secondary | ICD-10-CM | POA: Diagnosis present

## 2022-03-28 DIAGNOSIS — Z8261 Family history of arthritis: Secondary | ICD-10-CM | POA: Diagnosis not present

## 2022-03-28 DIAGNOSIS — F1120 Opioid dependence, uncomplicated: Secondary | ICD-10-CM | POA: Diagnosis present

## 2022-03-28 DIAGNOSIS — Z79899 Other long term (current) drug therapy: Secondary | ICD-10-CM | POA: Diagnosis not present

## 2022-03-28 DIAGNOSIS — M545 Low back pain, unspecified: Secondary | ICD-10-CM | POA: Diagnosis present

## 2022-03-28 DIAGNOSIS — Z720 Tobacco use: Secondary | ICD-10-CM | POA: Diagnosis not present

## 2022-03-28 DIAGNOSIS — Z8489 Family history of other specified conditions: Secondary | ICD-10-CM

## 2022-03-28 DIAGNOSIS — Z832 Family history of diseases of the blood and blood-forming organs and certain disorders involving the immune mechanism: Secondary | ICD-10-CM | POA: Diagnosis not present

## 2022-03-28 DIAGNOSIS — F17200 Nicotine dependence, unspecified, uncomplicated: Secondary | ICD-10-CM | POA: Diagnosis present

## 2022-03-28 LAB — CBC WITH DIFFERENTIAL/PLATELET
Basophils Absolute: 0 10*3/uL (ref 0.0–0.1)
Basophils Relative: 0 %
Eosinophils Absolute: 0.2 10*3/uL (ref 0.0–0.5)
Eosinophils Relative: 2 %
HCT: 28.9 % — ABNORMAL LOW (ref 36.0–46.0)
Hemoglobin: 10.5 g/dL — ABNORMAL LOW (ref 12.0–15.0)
Lymphocytes Relative: 44 %
Lymphs Abs: 4.1 10*3/uL — ABNORMAL HIGH (ref 0.7–4.0)
MCH: 30 pg (ref 26.0–34.0)
MCHC: 36.3 g/dL — ABNORMAL HIGH (ref 30.0–36.0)
MCV: 82.6 fL (ref 80.0–100.0)
Monocytes Absolute: 0.8 10*3/uL (ref 0.1–1.0)
Monocytes Relative: 8 %
Neutro Abs: 4.2 10*3/uL (ref 1.7–7.7)
Neutrophils Relative %: 45 %
Platelets: 243 10*3/uL (ref 150–400)
RBC: 3.5 MIL/uL — ABNORMAL LOW (ref 3.87–5.11)
RDW: 13.4 % (ref 11.5–15.5)
WBC: 9.3 10*3/uL (ref 4.0–10.5)
nRBC: 0 /100 WBC
nRBC: 0.2 % (ref 0.0–0.2)

## 2022-03-28 LAB — COMPREHENSIVE METABOLIC PANEL
ALT: 22 U/L (ref 0–44)
AST: 45 U/L — ABNORMAL HIGH (ref 15–41)
Albumin: 4 g/dL (ref 3.5–5.0)
Alkaline Phosphatase: 55 U/L (ref 38–126)
Anion gap: 3 — ABNORMAL LOW (ref 5–15)
BUN: 10 mg/dL (ref 6–20)
CO2: 24 mmol/L (ref 22–32)
Calcium: 9.4 mg/dL (ref 8.9–10.3)
Chloride: 111 mmol/L (ref 98–111)
Creatinine, Ser: 0.63 mg/dL (ref 0.44–1.00)
GFR, Estimated: >60 mL/min (ref >60)
Glucose, Bld: 105 mg/dL — ABNORMAL HIGH (ref 70–99)
Potassium: 5.3 mmol/L — ABNORMAL HIGH (ref 3.5–5.1)
Sodium: 138 mmol/L (ref 135–145)
Total Bilirubin: 1.2 mg/dL (ref 0.3–1.2)
Total Protein: 8 g/dL (ref 6.5–8.1)

## 2022-03-28 LAB — RETICULOCYTES
Immature Retic Fract: 34.5 % — ABNORMAL HIGH (ref 2.3–15.9)
RBC.: 3.48 MIL/uL — ABNORMAL LOW (ref 3.87–5.11)
Retic Count, Absolute: 148.2 10*3/uL (ref 19.0–186.0)
Retic Ct Pct: 4.3 % — ABNORMAL HIGH (ref 0.4–3.1)

## 2022-03-28 LAB — I-STAT BETA HCG BLOOD, ED (MC, WL, AP ONLY): I-stat hCG, quantitative: <5 m[IU]/mL (ref <5)

## 2022-03-28 MED ORDER — KETOROLAC TROMETHAMINE 30 MG/ML IJ SOLN
15.0000 mg | Freq: Once | INTRAMUSCULAR | Status: AC
  Administered 2022-03-28: 15 mg via INTRAVENOUS
  Filled 2022-03-28: qty 1

## 2022-03-28 MED ORDER — HYDROMORPHONE HCL 1 MG/ML IJ SOLN: 1.0000 mg | INTRAMUSCULAR | Status: DC

## 2022-03-28 MED ORDER — ENOXAPARIN SODIUM 40 MG/0.4ML IJ SOSY
40.0000 mg | PREFILLED_SYRINGE | INTRAMUSCULAR | Status: DC
  Filled 2022-03-28: qty 0.4

## 2022-03-28 MED ORDER — FOLIC ACID 1 MG PO TABS
1.0000 mg | ORAL_TABLET | Freq: Every day | ORAL | Status: DC
  Administered 2022-03-28 – 2022-04-01 (×5): 1 mg via ORAL
  Filled 2022-03-28 (×5): qty 1

## 2022-03-28 MED ORDER — HYDROMORPHONE 1 MG/ML IV SOLN
INTRAVENOUS | Status: DC
  Administered 2022-03-28: 30 mg via INTRAVENOUS
  Administered 2022-03-29: 7.2 mg via INTRAVENOUS
  Administered 2022-03-29: 10 mg via INTRAVENOUS
  Administered 2022-03-29: 7.2 mg via INTRAVENOUS
  Administered 2022-03-29: 7.8 mg via INTRAVENOUS
  Administered 2022-03-29: 30 mg via INTRAVENOUS
  Administered 2022-03-29: 5.5 mg via INTRAVENOUS
  Administered 2022-03-29: 10.8 mg via INTRAVENOUS
  Administered 2022-03-30: 7.8 mg via INTRAVENOUS
  Administered 2022-03-30: 3.58 mg via INTRAVENOUS
  Administered 2022-03-30: 5.5 mg via INTRAVENOUS
  Administered 2022-03-30: 30 mg via INTRAVENOUS
  Administered 2022-03-30: 7.8 mg via INTRAVENOUS
  Administered 2022-03-30 (×2): 30 mg via INTRAVENOUS
  Administered 2022-03-31: 6.57 mg via INTRAVENOUS
  Administered 2022-03-31: 7.2 mg via INTRAVENOUS
  Administered 2022-03-31: 9.6 mg via INTRAVENOUS
  Filled 2022-03-28 (×4): qty 30

## 2022-03-28 MED ORDER — MORPHINE SULFATE ER 15 MG PO TBCR
60.0000 mg | EXTENDED_RELEASE_TABLET | Freq: Two times a day (BID) | ORAL | Status: DC
  Administered 2022-03-28 – 2022-04-01 (×8): 60 mg via ORAL
  Filled 2022-03-28 (×8): qty 4

## 2022-03-28 MED ORDER — SODIUM CHLORIDE 0.9% FLUSH: 9.0000 mL | INTRAVENOUS | Status: DC | PRN

## 2022-03-28 MED ORDER — HYDROMORPHONE HCL 1 MG/ML IJ SOLN
1.0000 mg | INTRAMUSCULAR | Status: AC
  Administered 2022-03-28: 1 mg via SUBCUTANEOUS
  Filled 2022-03-28: qty 1

## 2022-03-28 MED ORDER — HYDROMORPHONE HCL 2 MG/ML IJ SOLN
2.0000 mg | INTRAMUSCULAR | Status: AC
  Administered 2022-03-28 (×2): 2 mg via INTRAVENOUS
  Filled 2022-03-28 (×2): qty 1

## 2022-03-28 MED ORDER — POLYETHYLENE GLYCOL 3350 17 G PO PACK: 17.0000 g | PACK | Freq: Every day | ORAL | Status: DC | PRN

## 2022-03-28 MED ORDER — NALOXONE HCL 0.4 MG/ML IJ SOLN: 0.4000 mg | INTRAMUSCULAR | Status: DC | PRN

## 2022-03-28 MED ORDER — DIPHENHYDRAMINE HCL 25 MG PO CAPS: 25.0000 mg | ORAL_CAPSULE | ORAL | Status: DC | PRN

## 2022-03-28 MED ORDER — HYDROMORPHONE HCL 1 MG/ML IJ SOLN
1.0000 mg | INTRAMUSCULAR | Status: AC
  Administered 2022-03-28: 1 mg via INTRAVENOUS
  Filled 2022-03-28: qty 1

## 2022-03-28 MED ORDER — ONDANSETRON HCL 4 MG/2ML IJ SOLN
4.0000 mg | Freq: Four times a day (QID) | INTRAMUSCULAR | Status: DC | PRN
  Administered 2022-03-28 – 2022-03-31 (×5): 4 mg via INTRAVENOUS
  Filled 2022-03-28 (×6): qty 2

## 2022-03-28 MED ORDER — ONDANSETRON 8 MG PO TBDP: 8.0000 mg | ORAL_TABLET | Freq: Three times a day (TID) | ORAL | Status: DC | PRN

## 2022-03-28 MED ORDER — CITALOPRAM HYDROBROMIDE 20 MG PO TABS
20.0000 mg | ORAL_TABLET | Freq: Every day | ORAL | Status: DC
  Administered 2022-03-28 – 2022-04-01 (×5): 20 mg via ORAL
  Filled 2022-03-28 (×5): qty 1

## 2022-03-28 MED ORDER — DULOXETINE HCL 60 MG PO CPEP
60.0000 mg | ORAL_CAPSULE | Freq: Every day | ORAL | Status: DC
  Administered 2022-03-28 – 2022-04-01 (×5): 60 mg via ORAL
  Filled 2022-03-28 (×5): qty 1

## 2022-03-28 MED ORDER — DIPHENHYDRAMINE HCL 25 MG PO CAPS
25.0000 mg | ORAL_CAPSULE | ORAL | Status: DC | PRN
  Filled 2022-03-28: qty 1

## 2022-03-28 MED ORDER — CYCLOBENZAPRINE HCL 10 MG PO TABS: 10.0000 mg | ORAL_TABLET | Freq: Every day | ORAL | Status: DC | PRN

## 2022-03-28 MED ORDER — KETOROLAC TROMETHAMINE 15 MG/ML IJ SOLN: 15.0000 mg | INTRAMUSCULAR | Status: DC

## 2022-03-28 MED ORDER — SODIUM CHLORIDE 0.45 % IV SOLN: INTRAVENOUS | Status: DC

## 2022-03-28 MED ORDER — OXYCODONE HCL 5 MG PO TABS
10.0000 mg | ORAL_TABLET | Freq: Four times a day (QID) | ORAL | Status: DC | PRN
  Administered 2022-03-29: 10 mg via ORAL
  Filled 2022-03-28: qty 2

## 2022-03-28 MED ORDER — SENNOSIDES-DOCUSATE SODIUM 8.6-50 MG PO TABS
1.0000 | ORAL_TABLET | Freq: Two times a day (BID) | ORAL | Status: DC
  Administered 2022-03-28 – 2022-04-01 (×8): 1 via ORAL
  Filled 2022-03-28 (×8): qty 1

## 2022-03-28 MED ORDER — KETOROLAC TROMETHAMINE 15 MG/ML IJ SOLN
15.0000 mg | Freq: Four times a day (QID) | INTRAMUSCULAR | Status: DC
  Administered 2022-03-28 – 2022-04-01 (×15): 15 mg via INTRAVENOUS
  Filled 2022-03-28 (×15): qty 1

## 2022-03-28 MED ORDER — ONDANSETRON HCL 4 MG/2ML IJ SOLN: 4.0000 mg | INTRAMUSCULAR | Status: DC | PRN

## 2022-03-28 NOTE — ED Triage Notes (Signed)
Pt reports sickle cell pain since Wednesday unrelieved by home medications. Pt states pain originates in her back and radiates downward.

## 2022-03-28 NOTE — ED Provider Notes (Signed)
[facility] DEPT Provider Note   CSN: [id number] Arrival date & time: 03/28/22  1018     History  Chief Complaint  Patient presents with   Sickle Cell Pain Crisis    Carmen [name] is a 31 y.o. female.  HPI     31 year old female comes in with chief complaint of sickle cell pain.  Patient has history of sickle cell anemia.  Indicates that she has been having pain with weather change over the last few days.  Pain is located over her hip and right leg.  She is also having pain around her abdomen radiating to her back.  The pain is typical of her sickle cell pain.  The cramping abdominal pain is something that she has been experiencing since she delivered her baby, typically related to her sickle cell pain.  Patient is status postcholecystectomy.  She denies any nausea, vomiting, fevers, chills.  She normally does not need narcotic medicine.  She took her morphine for breakthrough pain without relief.  Patient does not think she is pregnant.  Home Medications Prior to Admission medications   Medication Sig Start Date End Date Taking? Authorizing Provider  citalopram (CELEXA) 20 MG tablet TAKE 1 TABLET BY MOUTH EVERY DAY Patient taking differently: Take 20 mg by mouth every morning. 02/24/21  Yes [name], FNP  cyclobenzaprine (FLEXERIL) 10 MG tablet TAKE 1 TABLET BY MOUTH TWICE A DAY Patient taking differently: Take 10 mg by mouth daily as needed for muscle spasms. 10/12/21  Yes Vevelyn Francois, NP  DULoxetine (CYMBALTA) 30 MG capsule Take 2 capsules (60 mg total) by mouth daily. 02/04/22 08/03/22 Yes [name], FNP  folic acid (FOLVITE) 1 MG tablet Take 1 tablet (1 mg total) by mouth daily. Patient taking differently: Take 1 mg by mouth every morning. 03/15/19  Yes Lanae Boast, FNP  morphine (MS CONTIN) 60 MG 12 hr tablet Take 1 tablet (60 mg total) by mouth every 12 (twelve) hours. 03/05/22 04/04/22 Yes [name], FNP  Prenatal  Vit-Fe Fumarate-FA (PRENATAL VITAMIN PLUS LOW IRON) 27-1 MG TABS Take 1 tablet by mouth every morning.   Yes [provider]  Vitamin D, Ergocalciferol, (DRISDOL) 1.25 MG (50000 UNIT) CAPS capsule TAKE ONE CAPSULE BY MOUTH ONE TIME PER WEEK Patient taking differently: Take 50,000 Units by mouth every Wednesday. 09/03/20  Yes King, Diona Foley, NP  betamethasone valerate ointment (VALISONE) 0.1 % APPLY TO AFFECTED AREA TWICE A DAY Strength: 0.1 % Patient not taking: Reported on 03/28/2022 12/23/21   Vevelyn Francois, NP  Oxycodone HCl 10 MG TABS Take 10 mg by mouth every 6 (six) hours as needed (pain).    [provider]      Allergies    Ketamine    Review of Systems   Review of Systems  Physical Exam Updated Vital Signs BP 100/67   Pulse 85   Temp 99.1 F (37.3 C) (Oral)   Resp 20   Ht '5\' 3"'$  (1.6 m)   Wt 86.2 kg   SpO2 98%   BMI 33.66 kg/m  Physical Exam Vitals and nursing note reviewed.  Constitutional:      Appearance: She is well-developed.  HENT:     Head: Atraumatic.  Cardiovascular:     Rate and Rhythm: Tachycardia present.  Pulmonary:     Effort: Pulmonary effort is normal.  Abdominal:     Tenderness: There is abdominal tenderness. There is no guarding or rebound.  Musculoskeletal:  Cervical back: Normal range of motion and neck supple.  Skin:    General: Skin is warm and dry.  Neurological:     Mental Status: She is alert and oriented to person, place, and time.    ED Results / Procedures / Treatments   Labs (all labs ordered are listed, but only abnormal results are displayed) Labs Reviewed  COMPREHENSIVE METABOLIC PANEL - Abnormal; Notable for the following components:      Result Value   Potassium 5.3 (*)    Glucose, Bld 105 (*)    AST 45 (*)    Anion gap 3 (*)    All other components within normal limits  CBC WITH DIFFERENTIAL/PLATELET - Abnormal; Notable for the following components:   RBC 3.50 (*)    Hemoglobin 10.5 (*)    HCT  28.9 (*)    MCHC 36.3 (*)    Lymphs Abs 4.1 (*)    All other components within normal limits  RETICULOCYTES - Abnormal; Notable for the following components:   Retic Ct Pct 4.3 (*)    RBC. 3.48 (*)    Immature Retic Fract 34.5 (*)    All other components within normal limits  I-STAT BETA HCG BLOOD, ED ([facility], [facility], [facility] ONLY)    EKG None  Radiology No results found.  Procedures Procedures    Medications Ordered in ED Medications  HYDROmorphone (DILAUDID) injection 1 mg (has no administration in time range)  diphenhydrAMINE (BENADRYL) capsule 25-50 mg (has no administration in time range)  ondansetron (ZOFRAN-ODT) disintegrating tablet 8 mg (has no administration in time range)  HYDROmorphone (DILAUDID) injection 2 mg (2 mg Intravenous Given 03/28/22 1503)  HYDROmorphone (DILAUDID) injection 1 mg (1 mg Intravenous Given 03/28/22 1319)  HYDROmorphone (DILAUDID) injection 1 mg (1 mg Intravenous Given 03/28/22 1247)  HYDROmorphone (DILAUDID) injection 1 mg (1 mg Subcutaneous Given 03/28/22 1139)  HYDROmorphone (DILAUDID) injection 1 mg (1 mg Subcutaneous Given 03/28/22 1216)  ketorolac (TORADOL) 30 MG/ML injection 15 mg (15 mg Intravenous Given 03/28/22 1504)    ED Course/ Medical Decision Making/ A&P Clinical Course as of 03/28/22 1541  Sun Mar 28, 2022  1541 Patient's IV was established later in her stay.  She has received 3 rounds of parenteral pain medications.  She would like to go home.  We will order more IV pain medicine, if not getting better she will be admitted by the incoming team. [AN]    Clinical Course User Index [AN] Varney Biles, MD                           Medical Decision Making Amount and/or Complexity of Data Reviewed Labs: ordered.  Risk Prescription drug management.   This patient presents to the ED with chief complaint(s) of sickle cell pain with pertinent past medical history of sickle cell anemia.  She is also reporting some abdominal cramping pain and  is status postcholecystectomy which further complicates the presenting complaint. The complaint involves an extensive differential diagnosis and also carries with it a high risk of complications and morbidity.    Patient has history of sickle cell anemia with complications from it including  vaso-occlusive crisis in the past.  We have reviewed patient's previous ED visits, recent lab results and recent imaging results for this encounter.  Differential diagnosis includes vaso-occlusive pain, occult infection, dehydration, electrolyte abnormality, hyperbilirubinemia, aplastic anemia.  VSS and WNL -  hemodynamically stable, but patient is tachycardic likely secondary to pain.  History and exam is not indicative of any specific source of infection at this time.  Pain appears vaso-occlusive type and typical of previous pain. Appropriate labs ordered.  Pain control and symptom management initiated while in the ED with parenteral medication. We will reassess patient after multiple doses of analgesics. Goal is to break the pain and see if patient feels comfortable going home.  Currently, there is no signs of severe decompensation clinically. Will continue to monitor closely.  Admission has been considered for this patient, and we will proceed with this request If we are unable to control the pain, we will admit the patient.  Final Clinical Impression(s) / ED Diagnoses Final diagnoses:  Sickle cell pain crisis [facility])    Rx / DC Orders ED Discharge Orders     None         Varney Biles, MD 03/28/22 1541

## 2022-03-28 NOTE — ED Notes (Signed)
ED TO INPATIENT HANDOFF REPORT  ED Nurse Name and Phone #: Baxter Flattery, RN  S Name/Age/Gender Carmen [name] 31 y.o. female Room/Bed: WA19/WA19  Code Status   Code Status: Prior  Home/SNF/Other Home Patient oriented to: self, place, time, and situation Is this baseline? Yes   Triage Complete: Triage complete  Chief Complaint Sickle cell anemia with crisis [facility]) [D57.00]  Triage Note Pt reports sickle cell pain since Wednesday unrelieved by home medications. Pt states pain originates in her back and radiates downward.    Allergies Allergies  Allergen Reactions   Ketamine Hives and Other (See Comments)    Makes feel funny    Level of Care/Admitting Diagnosis ED Disposition     ED Disposition  Admit   Condition  --   [place]: [place] [[id number]]  Level of Care: Med-Surg [16]  May admit patient to [facility] or [facility] if equivalent level of care is available:: No  Covid Evaluation: Asymptomatic - no recent exposure (last 10 days) testing not required  Diagnosis: Sickle cell anemia with crisis [facility]) [[id number]]  Admitting Physician: Tresa Garter [[id number]]  Attending Physician: Tresa Garter [[id number]]  Estimated length of stay: past midnight tomorrow  Certification:: I certify this patient will need inpatient services for at least 2 midnights  Bed request comments: 6E          B Medical/Surgery History Past Medical History:  Diagnosis Date   Anxiety    Headache(784.0)    Heart murmur    Sickle cell crisis ([place])    Syphilis 2015   Was diagnosed and received one injection of antibiotics   Thrombocytosis 11/22/2014     CBC Latest Ref Rng & Units 12/13/2018 12/11/2018 12/10/2018 WBC 4.0 - 10.5 K/uL 14.8(H) 13.2(H) 15.9(H) Hemoglobin 12.0 - 15.0 g/dL 8.2(L) 7.7(L) 8.1(L) Hematocrit 36.0 - 46.0 % 23.7(L) 23.2(L) 24.5(L) Platelets 150 - 400 K/uL 326 399 388     Past Surgical History:  Procedure Laterality Date    CESAREAN SECTION N/A 02/05/2019   Procedure: CESAREAN SECTION;  Surgeon: [name], MD;  Location: [facility] LD [facility]S;  Service: Obstetrics;  Laterality: N/A;   CHOLECYSTECTOMY N/A 11/30/2014   Procedure: LAPAROSCOPIC CHOLECYSTECTOMY SINGLE SITE WITH INTRAOPERATIVE CHOLANGIOGRAM;  Surgeon: Michael Boston, MD;  Location: [facility] ORS;  Service: General;  Laterality: N/A;   SPLENECTOMY       A IV Location/Drains/Wounds Patient Lines/Drains/Airways Status     Active Line/Drains/Airways     Name Placement date Placement time Site Days   Peripheral IV 03/28/22 22 G 1.75" Right;Upper Arm 03/28/22  1245  Arm  less than 1            Intake/Output Last 24 hours No intake or output data in the 24 hours ending 03/28/22 1811  Labs/Imaging Results for orders placed or performed during the hospital encounter of 03/28/22 (from the past 48 hour(s))  Comprehensive metabolic panel     Status: Abnormal   Collection Time: 03/28/22 12:44 PM  Result Value Ref Range   Sodium 138 135 - 145 mmol/L   Potassium 5.3 (H) 3.5 - 5.1 mmol/L   Chloride 111 98 - 111 mmol/L   CO2 24 22 - 32 mmol/L   Glucose, Bld 105 (H) 70 - 99 mg/dL    Comment: Glucose reference range applies only to samples taken after fasting for at least 8 hours.   BUN 10 6 - 20 mg/dL   Creatinine, Ser 0.63 0.44 - 1.00  mg/dL   Calcium 9.4 8.9 - 10.3 mg/dL   Total Protein 8.0 6.5 - 8.1 g/dL   Albumin 4.0 3.5 - 5.0 g/dL   AST 45 (H) 15 - 41 U/L   ALT 22 0 - 44 U/L   Alkaline Phosphatase 55 38 - 126 U/L   Total Bilirubin 1.2 0.3 - 1.2 mg/dL   GFR, Estimated >60 >60 mL/min    Comment: (NOTE) Calculated using the CKD-EPI Creatinine Equation (2021)    Anion gap 3 (L) 5 - 15    Comment: Performed at [facility], [place] [address] [place], [place] [address]  CBC with Differential     Status: Abnormal   Collection Time: 03/28/22 12:44 PM  Result Value Ref Range   WBC 9.3 4.0 - 10.5 K/uL   RBC 3.50 (L) 3.87 - 5.11 MIL/uL    Hemoglobin 10.5 (L) 12.0 - 15.0 g/dL   HCT 28.9 (L) 36.0 - 46.0 %   MCV 82.6 80.0 - 100.0 fL   MCH 30.0 26.0 - 34.0 pg   MCHC 36.3 (H) 30.0 - 36.0 g/dL   RDW 13.4 11.5 - 15.5 %   Platelets 243 150 - 400 K/uL    Comment: SPECIMEN CHECKED FOR CLOTS REPEATED TO VERIFY PLATELET COUNT CONFIRMED BY SMEAR    nRBC 0.2 0.0 - 0.2 %   Neutrophils Relative % 45 %   Neutro Abs 4.2 1.7 - 7.7 K/uL   Lymphocytes Relative 44 %   Lymphs Abs 4.1 (H) 0.7 - 4.0 K/uL   Monocytes Relative 8 %   Monocytes Absolute 0.8 0.1 - 1.0 K/uL   Eosinophils Relative 2 %   Eosinophils Absolute 0.2 0.0 - 0.5 K/uL   Basophils Relative 0 %   Basophils Absolute 0.0 0.0 - 0.1 K/uL   RBC Morphology Sickle cells present     Comment: TARGET CELLS POLYCHROMASIA PRESENT    nRBC 0 0 /100 WBC    Comment: Performed at [facility], [place] [address], [place], [place] [address]  Reticulocytes     Status: Abnormal   Collection Time: 03/28/22 12:44 PM  Result Value Ref Range   Retic Ct Pct 4.3 (H) 0.4 - 3.1 %   RBC. 3.48 (L) 3.87 - 5.11 MIL/uL   Retic Count, Absolute 148.2 19.0 - 186.0 K/uL   Immature Retic Fract 34.5 (H) 2.3 - 15.9 %    Comment: Performed at [facility], [place] [address] [place], [place] [address]  I-Stat Beta hCG blood, ED ([facility], [facility], [facility] only)     Status: None   Collection Time: 03/28/22 12:57 PM  Result Value Ref Range   I-stat hCG, quantitative <5.0 <5 mIU/mL   Comment 3            Comment:   GEST. AGE      CONC.  (mIU/mL)   <=1 WEEK        5 - 50     2 WEEKS       50 - 500     3 WEEKS       100 - 10,000     4 WEEKS     1,000 - 30,000        FEMALE AND NON-PREGNANT FEMALE:     LESS THAN 5 mIU/mL    No results found.  Pending Labs [employer] (From admission, onward)     Start     Ordered   Signed and Held  HIV Antibody (routine  testing w rflx)  (HIV Antibody (Routine testing w reflex) panel)  Once,   R        Signed and Held   Signed and Held  CBC  Daily,    R      Signed and Held   Signed and Held  Urinalysis, Routine w reflex microscopic Urine, Clean Catch  Once,   R        Signed and Held            Vitals/Pain Today's Vitals   03/28/22 1700 03/28/22 1715 03/28/22 1730 03/28/22 1745  BP: 120/72 123/72 98/68 127/71  Pulse: 71 64 61 64  Resp: 15 16 (!) 26 16  Temp:      TempSrc:      SpO2: 96% 95% 97% 96%  Weight:      Height:      PainSc:        Isolation Precautions No active isolations  Medications Medications  HYDROmorphone (DILAUDID) injection 1 mg (has no administration in time range)  diphenhydrAMINE (BENADRYL) capsule 25-50 mg (has no administration in time range)  ondansetron (ZOFRAN-ODT) disintegrating tablet 8 mg (has no administration in time range)  HYDROmorphone (DILAUDID) injection 1 mg (1 mg Intravenous Given 03/28/22 1319)  HYDROmorphone (DILAUDID) injection 1 mg (1 mg Intravenous Given 03/28/22 1247)  HYDROmorphone (DILAUDID) injection 1 mg (1 mg Subcutaneous Given 03/28/22 1139)  HYDROmorphone (DILAUDID) injection 1 mg (1 mg Subcutaneous Given 03/28/22 1216)  HYDROmorphone (DILAUDID) injection 2 mg (2 mg Intravenous Given 03/28/22 1601)  ketorolac (TORADOL) 30 MG/ML injection 15 mg (15 mg Intravenous Given 03/28/22 1504)    Mobility walks Low fall risk   Focused Assessments Cardiac Assessment Handoff:    Lab Results  Component Value Date   CKTOTAL 38 02/10/2019   TROPONINI <0.03 08/25/2017   Lab Results  Component Value Date   DDIMER 0.82 (H) 09/05/2021   Does the Patient currently have chest pain? No    R Recommendations: See Admitting Provider Note  Report given to:   Additional Notes:

## 2022-03-28 NOTE — Plan of Care (Signed)
  Problem: Sensory: Goal: Pain level will decrease with appropriate interventions Outcome: Progressing   

## 2022-03-28 NOTE — H&P (Signed)
H&P  Patient Demographics:  Carmen [name], is a 31 y.o. female  MRN: [id number]   DOB - 04-Feb-1991  Admit Date - 03/28/2022  Outpatient Primary MD for the patient is Fenton Foy, NP  Chief Complaint  Patient presents with   Sickle Cell Pain Crisis      HPI:   Carmen [name]  is a 31 y.o. female with medical history significant for sickle cell disease, chronic pain syndrome, opiate dependence and tolerance, anemia of chronic disease and major depressive disorder who came to the emergency room today with major complaints of generalized body pain especially in her lower back and lower extremities that is consistent with her typical sickle cell pain crisis.  Patient attributes current pain to change in weather, its been cold lately.  Patient took her home pain medications with no sustained relief.  She rates her pain at 10/10 this morning, characterized as throbbing and achy and constant.  She denies any fever, cough, chest pain, shortness of breath, nausea, vomiting or diarrhea.  No urinary symptoms.  ER course: Her vital signs in the emergency room are:BP 100/67   Pulse 85   Temp 99.1 F (37.3 C) (Oral)   Resp 20   Ht '5\' 3"'$  (1.6 m)   Wt 86.2 kg   SpO2 98%   BMI 33.66 kg/m.  Comprehensive metabolic panel was largely within normal limits except for slightly elevated AST to 45, hemoglobin was 10.5 which is consistent with patient's baseline.  WBC was within normal limits of 9.3.  She has reticulocyte count of 4.3%.  Patient received 5 rounds of IV Dilaudid and other adjunct therapies in the emergency room with no sustained relief.  We will admit patient to the hospital for further evaluation and management of sickle cell pain crisis.   Review of systems:  In addition to the HPI above, patient reports No fever or chills No Headache, No changes with vision or hearing No problems swallowing food or liquids No chest pain, cough or shortness of breath No abdominal pain, No nausea or vomiting,  Bowel movements are regular No blood in stool or urine No dysuria No new skin rashes or bruise No new weakness, tingling, numbness in any extremity No recent weight gain or loss No polyuria, polydypsia or polyphagia No significant Mental Stressors  A full 10 point Review of Systems was done, except as stated above, all other Review of Systems were negative.  With Past History of the following :   Past Medical History:  Diagnosis Date   Anxiety    Headache(784.0)    Heart murmur    Sickle cell crisis ([place])    Syphilis 2015   Was diagnosed and received one injection of antibiotics   Thrombocytosis 11/22/2014     CBC Latest Ref Rng & Units 12/13/2018 12/11/2018 12/10/2018 WBC 4.0 - 10.5 K/uL 14.8(H) 13.2(H) 15.9(H) Hemoglobin 12.0 - 15.0 g/dL 8.2(L) 7.7(L) 8.1(L) Hematocrit 36.0 - 46.0 % 23.7(L) 23.2(L) 24.5(L) Platelets 150 - 400 K/uL 326 399 388        Past Surgical History:  Procedure Laterality Date   CESAREAN SECTION N/A 02/05/2019   Procedure: CESAREAN SECTION;  Surgeon: [name], MD;  Location: [facility] ORS;  Service: Obstetrics;  Laterality: N/A;   CHOLECYSTECTOMY N/A 11/30/2014   Procedure: LAPAROSCOPIC CHOLECYSTECTOMY SINGLE SITE WITH INTRAOPERATIVE CHOLANGIOGRAM;  Surgeon: Michael Boston, MD;  Location: [facility] ORS;  Service: General;  Laterality: N/A;   SPLENECTOMY       Social History:  Social History   Tobacco Use   Smoking status: Some Days    Types: Cigarettes   Smokeless tobacco: Never  Substance Use Topics   Alcohol use: No     Lives - At home   Family History :   Family History  Problem Relation Age of Onset   Hypertension Mother    Sickle cell anemia Sister    Kidney disease Sister        Lupus   Arthritis Sister    Sickle cell anemia Sister    Sickle cell trait Sister    Heart disease Maternal Aunt        CABG   Heart disease Maternal Uncle        CABG   Lupus Sister      Home Medications:   Prior to Admission medications   Medication Sig  Start Date End Date Taking? Authorizing Provider  citalopram (CELEXA) 20 MG tablet TAKE 1 TABLET BY MOUTH EVERY DAY Patient taking differently: Take 20 mg by mouth every morning. 02/24/21  Yes [name], FNP  cyclobenzaprine (FLEXERIL) 10 MG tablet TAKE 1 TABLET BY MOUTH TWICE A DAY Patient taking differently: Take 10 mg by mouth daily as needed for muscle spasms. 10/12/21  Yes Vevelyn Francois, NP  DULoxetine (CYMBALTA) 30 MG capsule Take 2 capsules (60 mg total) by mouth daily. 02/04/22 08/03/22 Yes [name], FNP  folic acid (FOLVITE) 1 MG tablet Take 1 tablet (1 mg total) by mouth daily. Patient taking differently: Take 1 mg by mouth every morning. 03/15/19  Yes Lanae Boast, FNP  morphine (MS CONTIN) 60 MG 12 hr tablet Take 1 tablet (60 mg total) by mouth every 12 (twelve) hours. 03/05/22 04/04/22 Yes [name], FNP  Prenatal Vit-Fe Fumarate-FA (PRENATAL VITAMIN PLUS LOW IRON) 27-1 MG TABS Take 1 tablet by mouth every morning.   Yes [provider]  Vitamin D, Ergocalciferol, (DRISDOL) 1.25 MG (50000 UNIT) CAPS capsule TAKE ONE CAPSULE BY MOUTH ONE TIME PER WEEK Patient taking differently: Take 50,000 Units by mouth every Wednesday. 09/03/20  Yes King, Diona Foley, NP  betamethasone valerate ointment (VALISONE) 0.1 % APPLY TO AFFECTED AREA TWICE A DAY Strength: 0.1 % Patient not taking: Reported on 03/28/2022 12/23/21   Vevelyn Francois, NP  Oxycodone HCl 10 MG TABS Take 10 mg by mouth every 6 (six) hours as needed (pain).    [provider]     Allergies:   Allergies  Allergen Reactions   Ketamine Hives and Other (See Comments)    Makes feel funny     Physical Exam:   Vitals:   Vitals:   03/28/22 1730 03/28/22 1745  BP: 98/68 127/71  Pulse: 61 64  Resp: (!) 26 16  Temp:    SpO2: 97% 96%    Physical Exam: Constitutional: Patient appears well-developed and well-nourished. Not in obvious distress. Obese HENT: Normocephalic, atraumatic, External  right and left ear normal. Oropharynx is clear and moist.  Eyes: Conjunctivae and EOM are normal. PERRLA, no scleral icterus. Neck: Normal ROM. Neck supple. No JVD. No tracheal deviation. No thyromegaly. CVS: RRR, S1/S2 +, no murmurs, no gallops, no carotid bruit.  Pulmonary: Effort and breath sounds normal, no stridor, rhonchi, wheezes, rales.  Abdominal: Soft. BS +, no distension, tenderness, rebound or guarding.  Musculoskeletal: Normal range of motion. No edema and no tenderness.  Lymphadenopathy: No lymphadenopathy noted, cervical, inguinal or axillary Neuro: Alert. Normal reflexes, muscle tone coordination. No cranial nerve  deficit. Skin: Skin is warm and dry. No rash noted. Not diaphoretic. No erythema. No pallor. Psychiatric: Normal mood and affect. Behavior, judgment, thought content normal.   Data Review:   CBC Recent Labs  Lab 03/28/22 1244  WBC 9.3  HGB 10.5*  HCT 28.9*  PLT 243  MCV 82.6  MCH 30.0  MCHC 36.3*  RDW 13.4  LYMPHSABS 4.1*  MONOABS 0.8  EOSABS 0.2  BASOSABS 0.0   ------------------------------------------------------------------------------------------------------------------  Chemistries  Recent Labs  Lab 03/28/22 1244  NA 138  K 5.3*  CL 111  CO2 24  GLUCOSE 105*  BUN 10  CREATININE 0.63  CALCIUM 9.4  AST 45*  ALT 22  ALKPHOS 55  BILITOT 1.2   ------------------------------------------------------------------------------------------------------------------ estimated creatinine clearance is 107.9 mL/min (by C-G formula based on SCr of 0.63 mg/dL). ------------------------------------------------------------------------------------------------------------------ No results for input(s): TSH, T4TOTAL, T3FREE, THYROIDAB in the last 72 hours.  Invalid input(s): FREET3  Coagulation profile No results for input(s): INR, PROTIME in the last 168  hours. ------------------------------------------------------------------------------------------------------------------- No results for input(s): DDIMER in the last 72 hours. -------------------------------------------------------------------------------------------------------------------  Cardiac Enzymes No results for input(s): CKMB, TROPONINI, MYOGLOBIN in the last 168 hours.  Invalid input(s): CK ------------------------------------------------------------------------------------------------------------------ No results found for: BNP  ---------------------------------------------------------------------------------------------------------------  Urinalysis    Component Value Date/Time   COLORURINE YELLOW 02/01/2022 1837   APPEARANCEUR HAZY (A) 02/01/2022 1837   LABSPEC 1.016 02/01/2022 1837   PHURINE 5.0 02/01/2022 1837   GLUCOSEU NEGATIVE 02/01/2022 1837   HGBUR SMALL (A) 02/01/2022 1837   BILIRUBINUR NEGATIVE 02/01/2022 1837   BILIRUBINUR negative 08/26/2021 1527   BILIRUBINUR Negative 08/10/2019 [place] 02/01/2022 1837   PROTEINUR NEGATIVE 02/01/2022 1837   UROBILINOGEN 1.0 08/26/2021 1527   UROBILINOGEN 1.0 01/12/2018 0943   NITRITE NEGATIVE 02/01/2022 1837   LEUKOCYTESUR TRACE (A) 02/01/2022 1837    ----------------------------------------------------------------------------------------------------------------   Imaging Results:    No results found.   Assessment & Plan:  Principal Problem:   Sickle cell anemia with crisis (HCC) Active Problems:   Anemia of chronic disease   Tobacco abuse   Major depressive disorder   Chronic pain syndrome  Hb Sickle Cell Disease with crisis: Admit patient to MedSurg unit, start IVF 0.45% Saline @ 125 mls/hour, start weight based Dilaudid PCA, start IV Toradol 15 mg Q 6 H, Restart oral home pain medications, Monitor vitals very closely, Re-evaluate pain scale regularly, 2 L of Oxygen by [place], Patient will  be re-evaluated for pain in the context of function and relationship to baseline as care progresses. Major depressive disorder: Clinically stable.  Patient denies any suicidal ideations or thoughts.  We will resume her home medications and monitor very closely. Anemia of Chronic Disease: Hemoglobin is stable at baseline today.  There is no clinical indication for blood transfusion at this time.  We will monitor very closely and transfuse as appropriate. Chronic pain Syndrome: We will restart her continue her home medications. Tobacco use disorder: Caisley was counseled on the dangers of tobacco use, and was advised to quit. Reviewed strategies to maximize success, including removing cigarettes and smoking materials from environment, stress management and support of family/friends.   DVT Prophylaxis: Subcut Lovenox   AM Labs Ordered, also please review Full Orders  Family Communication: Admission, patient's condition and plan of care including tests being ordered have been discussed with the patient who indicate understanding and agree with the plan and Code Status.  Code Status: Full Code  Consults called: None    Admission status:  Inpatient    Time spent in minutes : 50 minutes  Angelica Chessman MD, MHA, CPE, FACP 03/28/2022 at 6:07 PM

## 2022-03-28 NOTE — ED Provider Notes (Signed)
3:28 PM-checkout from Dr. Kathrynn Humble, to evaluate the patient after last dose of narcotic analgesia treating sickle cell crisis.  4:15 PM-patient informed nursing she has not had any relief of her pain.  She has had 5 doses of hydromorphone since 12:16 PM, totaling 6 mg.  Her MME is 124, currently using only long-acting morphine sulfate tablets.  She has 3 hospital admissions for sickle cell crisis since March 2023.  Patient has documented hemoglobin SS disease, personality disorder, and major depressive disorder.  Reticulocyte count is elevated today, hemoglobin is stable at 10.5.  Potassium slightly elevated with normal renal function.  At this time she complains of persistent right leg pain which makes it difficult to flex her knee, and back pain.  She states these discomforts were present on arrival today and remain.  She is agreeable to hospitalization for further treatment of suspected sickle cell crisis.  4:37 PM-Consult complete with hospitalist. Patient case explained and discussed.  He agrees to admit patient for further evaluation and treatment. Call ended at 4:48 PM   Daleen Bo, MD 03/28/22 1654

## 2022-03-29 DIAGNOSIS — D5700 Hb-SS disease with crisis, unspecified: Secondary | ICD-10-CM | POA: Diagnosis not present

## 2022-03-29 LAB — CBC
HCT: 27.1 % — ABNORMAL LOW (ref 36.0–46.0)
Hemoglobin: 9.7 g/dL — ABNORMAL LOW (ref 12.0–15.0)
MCH: 29.5 pg (ref 26.0–34.0)
MCHC: 35.8 g/dL (ref 30.0–36.0)
MCV: 82.4 fL (ref 80.0–100.0)
Platelets: 394 10*3/uL (ref 150–400)
RBC: 3.29 MIL/uL — ABNORMAL LOW (ref 3.87–5.11)
RDW: 13.5 % (ref 11.5–15.5)
WBC: 9.6 10*3/uL (ref 4.0–10.5)
nRBC: 0.2 % (ref 0.0–0.2)

## 2022-03-29 LAB — HIV ANTIBODY (ROUTINE TESTING W REFLEX): HIV Screen 4th Generation wRfx: NONREACTIVE

## 2022-03-29 NOTE — Progress Notes (Signed)
SICKLE CELL SERVICE PROGRESS NOTE  [name] KWI:[id number] DOB: 04-Oct-1991 DOA: 03/28/2022 PCP: Fenton Foy, NP  Assessment/Plan: Principal Problem:   Sickle cell anemia with crisis ([place]) Active Problems:   Anemia of chronic disease   Tobacco abuse   Major depressive disorder   Chronic pain syndrome  Sickle cell anemia with crisis: Patient is still having significant pain described as 9 out of 10.  Currently on Dilaudid PCA, MS Contin 60 mg twice daily, oxycodone 10 mg every 6 hours as needed as well as Toradol.  This is day #2.  Slight improvement palpation.  Continue current regimen therefore. Anemia of chronic disease: Hemoglobin stable at 11.7.  Continue to monitor Hyperkalemia: Potassium was 5.3 yesterday.  We will recheck in the morning. Major depressive disorder, continue home regimen and counseling Chronic pain syndrome: Continue MS Contin Tobacco abuse: Counseling provided.  Add nicotine patch  Code Status: Full code Family Communication: No family at bedside Disposition Plan: [place]  Pager [phone]. If 7PM-7AM, please contact night-coverage.  03/29/2022, 4:55 PM  LOS: 1 day   Brief narrative: Carmen [name]  is a 31 y.o. female with medical history significant for sickle cell disease, chronic pain syndrome, opiate dependence and tolerance, anemia of chronic disease and major depressive disorder who came to the emergency room today with major complaints of generalized body pain especially in her lower back and lower extremities that is consistent with her typical sickle cell pain crisis.  Patient attributes current pain to change in weather, its been cold lately.  Patient took her home pain medications with no sustained relief.  She rates her pain at 10/10 this morning, characterized as throbbing and achy and constant.  She denies any fever, cough, chest pain, shortness of breath, nausea, vomiting or diarrhea.  No urinary  symptoms.   Consultants: None  Procedures: None  Antibiotics: None  HPI/Subjective: Patient is still complaining of 9 out of 10 pain.  She has been in and out of the bathroom most of the day.  Denied diarrhea or constipation.  She is nauseated however.  Currently on the Dilaudid PCA MS Contin, oxycodone and Toradol.  Objective: Vitals:   03/29/22 0825 03/29/22 1024 03/29/22 1108 03/29/22 1401  BP:  119/64  115/68  Pulse:  64  (!) 57  Resp: '20 17 19 15  '$ Temp:  (!) 97.5 F (36.4 C)  97.7 F (36.5 C)  TempSrc:  Oral  Oral  SpO2: 97% 98% 98% 98%  Weight:      Height:       Weight change:   Intake/Output Summary (Last 24 hours) at 03/29/2022 1655 Last data filed at 03/29/2022 0502 Gross per 24 hour  Intake 1259.02 ml  Output --  Net 1259.02 ml    General: Alert, awake, oriented x3, in no acute distress.  HEENT: [place]/AT [place]ER[place], EOMI Neck: Trachea midline,  no masses, no thyromegal,y no JVD, no carotid bruit OROPHARYNX:  Moist, No exudate/ erythema/lesions.  Heart: Regular rate and rhythm, without murmurs, rubs, gallops, PMI non-displaced, no heaves or thrills on palpation.  Lungs: Clear to auscultation, no wheezing or rhonchi noted. No increased vocal fremitus resonant to percussion  Abdomen: Soft, nontender, nondistended, positive bowel sounds, no masses no hepatosplenomegaly noted..  Neuro: No focal neurological deficits noted cranial nerves II through XII grossly intact. DTRs 2+ bilaterally upper and lower extremities. Strength 5 out of 5 in bilateral upper and lower extremities. Musculoskeletal: No warm swelling or erythema around joints, no  spinal tenderness noted. Psychiatric: Patient alert and oriented x3, good insight and cognition, good recent to remote recall. Lymph node survey: No cervical axillary or inguinal lymphadenopathy noted.   Data Reviewed: Basic Metabolic Panel: Recent Labs  Lab 03/28/22 1244  NA 138  K 5.3*  CL 111  CO2 24  GLUCOSE 105*  BUN 10   CREATININE 0.63  CALCIUM 9.4   Liver Function Tests: Recent Labs  Lab 03/28/22 1244  AST 45*  ALT 22  ALKPHOS 55  BILITOT 1.2  PROT 8.0  ALBUMIN 4.0   No results for input(s): LIPASE, AMYLASE in the last 168 hours. No results for input(s): AMMONIA in the last 168 hours. CBC: Recent Labs  Lab 03/28/22 1244 03/29/22 1020  WBC 9.3 9.6  NEUTROABS 4.2  --   HGB 10.5* 9.7*  HCT 28.9* 27.1*  MCV 82.6 82.4  PLT 243 394   Cardiac Enzymes: No results for input(s): CKTOTAL, CKMB, CKMBINDEX, TROPONINI in the last 168 hours. BNP (last 3 results) No results for input(s): BNP in the last 8760 hours.  ProBNP (last 3 results) No results for input(s): PROBNP in the last 8760 hours.  CBG: No results for input(s): GLUCAP in the last 168 hours.  No results found for this or any previous visit (from the past 240 hour(s)).   Studies: DG Chest 2 View  Result Date: 03/01/2022 CLINICAL DATA:  Shortness of breath. History of sickle cell disease. EXAM: CHEST - 2 VIEW COMPARISON:  10/31/2021 FINDINGS: The cardiac silhouette, mediastinal and hilar contours are normal. The lungs are clear. No pleural effusions. No pulmonary lesions. The bony thorax is intact. IMPRESSION: No acute cardiopulmonary findings. Electronically Signed   By: Marijo Sanes M.D.   On: 03/01/2022 12:17   DG Abd 1 View  Result Date: 03/03/2022 CLINICAL DATA:  Constipation EXAM: ABDOMEN - 1 VIEW COMPARISON:  None Available. FINDINGS: Bowel gas pattern is normal. Moderate stool burden. No radio-opaque calculi or other significant radiographic abnormality are seen. IMPRESSION: Nonobstructive bowel gas pattern. Electronically Signed   By: Yetta Glassman M.D.   On: 03/03/2022 13:47    Scheduled Meds:  citalopram  20 mg Oral Daily   DULoxetine  60 mg Oral Daily   enoxaparin (LOVENOX) injection  40 mg Subcutaneous K09F   folic acid  1 mg Oral Daily   HYDROmorphone   Intravenous Q4H   ketorolac  15 mg Intravenous Q6H    morphine  60 mg Oral Q12H   senna-docusate  1 tablet Oral BID   Continuous Infusions:  sodium chloride 125 mL/hr at 03/29/22 0944    Principal Problem:   Sickle cell anemia with crisis [facility]) Active Problems:   Anemia of chronic disease   Tobacco abuse   Major depressive disorder   Chronic pain syndrome

## 2022-03-30 DIAGNOSIS — D5700 Hb-SS disease with crisis, unspecified: Secondary | ICD-10-CM | POA: Diagnosis not present

## 2022-03-30 LAB — URINALYSIS, ROUTINE W REFLEX MICROSCOPIC
Bacteria, UA: NONE SEEN
Bilirubin Urine: NEGATIVE
Glucose, UA: NEGATIVE mg/dL
Hgb urine dipstick: NEGATIVE
Ketones, ur: NEGATIVE mg/dL
Leukocytes,Ua: NEGATIVE
Nitrite: NEGATIVE
Protein, ur: NEGATIVE mg/dL
Specific Gravity, Urine: 1.005 (ref 1.005–1.030)
pH: 6 (ref 5.0–8.0)

## 2022-03-30 LAB — CBC
HCT: 27.5 % — ABNORMAL LOW (ref 36.0–46.0)
Hemoglobin: 9.7 g/dL — ABNORMAL LOW (ref 12.0–15.0)
MCH: 29.5 pg (ref 26.0–34.0)
MCHC: 35.3 g/dL (ref 30.0–36.0)
MCV: 83.6 fL (ref 80.0–100.0)
Platelets: 293 10*3/uL (ref 150–400)
RBC: 3.29 MIL/uL — ABNORMAL LOW (ref 3.87–5.11)
RDW: 15 % (ref 11.5–15.5)
WBC: 11.8 10*3/uL — ABNORMAL HIGH (ref 4.0–10.5)
nRBC: 0.3 % — ABNORMAL HIGH (ref 0.0–0.2)

## 2022-03-30 MED ORDER — ENSURE ENLIVE PO LIQD
237.0000 mL | Freq: Two times a day (BID) | ORAL | Status: DC
  Administered 2022-03-30 – 2022-04-01 (×6): 237 mL via ORAL

## 2022-03-30 NOTE — Progress Notes (Signed)
Subjective: Carmen [name] is a 31 year old female with a medical history significant for sickle cell disease, chronic pain syndrome, opiate dependence and tolerance, history of major depression, and history of anemia of chronic disease was admitted for sickle cell pain crisis. Patient states that pain intensity is 8/10 primarily to low back and lower extremities.  She is unable to get comfortable.  She denies any headache, chest pain, shortness of breath, urinary symptoms, nausea, vomiting, or diarrhea.  Objective:  Vital signs in last 24 hours:  Vitals:   03/30/22 0759 03/30/22 1128 03/30/22 1129 03/30/22 1659  BP:   134/73   Pulse:   67   Resp: '14 16 16 14  '$ Temp:   98.2 F (36.8 C)   TempSrc:   Oral   SpO2: 98% 93% 92% 96%  Weight:      Height:        Intake/Output from previous day:   Intake/Output Summary (Last 24 hours) at 03/30/2022 1737 Last data filed at 03/30/2022 1500 Gross per 24 hour  Intake 3977.07 ml  Output 550 ml  Net 3427.07 ml    Physical Exam: General: Alert, awake, oriented x3, in no acute distress.  HEENT: [place]/AT [place]ER[place], EOMI Neck: Trachea midline,  no masses, no thyromegal,y no JVD, no carotid bruit OROPHARYNX:  Moist, No exudate/ erythema/lesions.  Heart: Regular rate and rhythm, without murmurs, rubs, gallops, PMI non-displaced, no heaves or thrills on palpation.  Lungs: Clear to auscultation, no wheezing or rhonchi noted. No increased vocal fremitus resonant to percussion  Abdomen: Soft, nontender, nondistended, positive bowel sounds, no masses no hepatosplenomegaly noted..  Neuro: No focal neurological deficits noted cranial nerves II through XII grossly intact. DTRs 2+ bilaterally upper and lower extremities. Strength 5 out of 5 in bilateral upper and lower extremities. Musculoskeletal: No warm swelling or erythema around joints, no spinal tenderness noted. Psychiatric: Patient alert and oriented x3, good insight and cognition, good recent to remote  recall. Lymph node survey: No cervical axillary or inguinal lymphadenopathy noted.  Lab Results:  Basic Metabolic Panel:    Component Value Date/Time   NA 138 03/28/2022 1244   NA 142 07/02/2020 1040   K 5.3 (H) 03/28/2022 1244   CL 111 03/28/2022 1244   CO2 24 03/28/2022 1244   BUN 10 03/28/2022 1244   BUN 8 07/02/2020 1040   CREATININE 0.63 03/28/2022 1244   GLUCOSE 105 (H) 03/28/2022 1244   CALCIUM 9.4 03/28/2022 1244   CBC:    Component Value Date/Time   WBC 11.8 (H) 03/30/2022 0608   HGB 9.7 (L) 03/30/2022 0608   HGB 9.8 (L) 07/02/2020 1040   HCT 27.5 (L) 03/30/2022 0608   HCT 28.9 (L) 07/02/2020 1040   PLT 293 03/30/2022 0608   PLT 628 (H) 07/02/2020 1040   MCV 83.6 03/30/2022 0608   MCV 88 07/02/2020 1040   NEUTROABS 4.2 03/28/2022 1244   NEUTROABS 3.0 07/02/2020 1040   LYMPHSABS 4.1 (H) 03/28/2022 1244   LYMPHSABS 3.5 (H) 07/02/2020 1040   MONOABS 0.8 03/28/2022 1244   EOSABS 0.2 03/28/2022 1244   EOSABS 0.2 07/02/2020 1040   BASOSABS 0.0 03/28/2022 1244   BASOSABS 0.1 07/02/2020 1040    No results found for this or any previous visit (from the past 240 hour(s)).  Studies/Results: No results found.  Medications: Scheduled Meds:  citalopram  20 mg Oral Daily   DULoxetine  60 mg Oral Daily   enoxaparin (LOVENOX) injection  40 mg Subcutaneous Q24H   feeding supplement  237 mL Oral BID BM   folic acid  1 mg Oral Daily   HYDROmorphone   Intravenous Q4H   ketorolac  15 mg Intravenous Q6H   morphine  60 mg Oral Q12H   senna-docusate  1 tablet Oral BID   Continuous Infusions:  sodium chloride 10 mL/hr at 03/30/22 1024   PRN Meds:.cyclobenzaprine, diphenhydrAMINE, naloxone **AND** sodium chloride flush, ondansetron (ZOFRAN) IV, oxyCODONE, polyethylene glycol  Consultants: none  Procedures: none  Antibiotics: none  Assessment/Plan: Principal Problem:   Sickle cell anemia with crisis (HCC) Active Problems:   Anemia of chronic disease    Tobacco abuse   Major depressive disorder   Chronic pain syndrome  Sickle cell disease with pain crisis: Continue IV Dilaudid PCA without changes in settings today Toradol 15 mg IV every 6 hours Oxycodone 10 mg every 6 hours as needed for severe breakthrough pain Decrease IV fluids to [facility] Monitor vital signs very closely, reevaluate pain scale regularly, and supplemental oxygen as needed.  Anemia of chronic disease: Hemoglobin is stable and consistent with patient's baseline.  There is no clinical indication for blood transfusion at this time.  Continue to monitor closely.  Chronic pain syndrome: Continue home medications  History of major depression: Stable.  Continue home medications.  No suicidal or homicidal intentions on today.  Continue to monitor patient closely. Code Status: Full Code Family Communication: N/A Disposition Plan: Not yet ready for discharge.  Discharge in the next 2-3 days  [place], MSN, FNP-C [facility] [place] [address] [place], [place] [address] [phone]  If 7PM-7AM, please contact night-coverage.  03/30/2022, 5:37 PM  LOS: 2 days

## 2022-03-30 NOTE — Plan of Care (Signed)
Plan of care reviewed and discussed with the patient. 

## 2022-03-31 LAB — CBC
HCT: 23.7 % — ABNORMAL LOW (ref 36.0–46.0)
Hemoglobin: 8.5 g/dL — ABNORMAL LOW (ref 12.0–15.0)
MCH: 29.5 pg (ref 26.0–34.0)
MCHC: 35.9 g/dL (ref 30.0–36.0)
MCV: 82.3 fL (ref 80.0–100.0)
Platelets: 476 10*3/uL — ABNORMAL HIGH (ref 150–400)
RBC: 2.88 MIL/uL — ABNORMAL LOW (ref 3.87–5.11)
RDW: 16.3 % — ABNORMAL HIGH (ref 11.5–15.5)
WBC: 13.4 10*3/uL — ABNORMAL HIGH (ref 4.0–10.5)
nRBC: 0.7 % — ABNORMAL HIGH (ref 0.0–0.2)

## 2022-03-31 MED ORDER — OXYCODONE HCL 5 MG PO TABS
10.0000 mg | ORAL_TABLET | ORAL | Status: DC
  Administered 2022-03-31 – 2022-04-01 (×5): 10 mg via ORAL
  Filled 2022-03-31 (×5): qty 2

## 2022-03-31 MED ORDER — HYDROMORPHONE 1 MG/ML IV SOLN
INTRAVENOUS | Status: DC
  Administered 2022-03-31: 4 mg via INTRAVENOUS
  Administered 2022-03-31: 7 mg via INTRAVENOUS
  Administered 2022-03-31: 30 mg via INTRAVENOUS
  Administered 2022-04-01: 3 mg via INTRAVENOUS
  Administered 2022-04-01: 4.5 mg via INTRAVENOUS
  Administered 2022-04-01: 1.5 mg via INTRAVENOUS
  Administered 2022-04-01: 4 mg via INTRAVENOUS
  Filled 2022-03-31: qty 30

## 2022-03-31 MED ORDER — CYCLOBENZAPRINE HCL 10 MG PO TABS
10.0000 mg | ORAL_TABLET | Freq: Three times a day (TID) | ORAL | Status: DC | PRN
  Administered 2022-03-31 – 2022-04-01 (×3): 10 mg via ORAL
  Filled 2022-03-31 (×3): qty 1

## 2022-03-31 NOTE — Progress Notes (Signed)
Subjective: Carmen [name] is a 31 year old female with a medical history significant for sickle cell disease, chronic pain syndrome, opiate dependence and tolerance, history of major depression, and history of anemia of chronic disease was admitted for sickle cell pain crisis.  Patient continues to complain of severe low back pain. She states that pain intensity is 8/10 primarily to low back and lower extremities.  She is unable to get comfortable.  She denies any headache, chest pain, shortness of breath, urinary symptoms, nausea, vomiting, or diarrhea.  Objective:  Vital signs in last 24 hours:  Vitals:   03/31/22 0633 03/31/22 0827 03/31/22 1007 03/31/22 1304  BP: 123/74  (!) 117/55   Pulse: 89  87   Resp: '16 16 18 15  '$ Temp: 97.7 F (36.5 C)  98.2 F (36.8 C)   TempSrc: Oral  Oral   SpO2: 92% 93% 93% 94%  Weight:      Height:        Intake/Output from previous day:   Intake/Output Summary (Last 24 hours) at 03/31/2022 1642 Last data filed at 03/31/2022 0600 Gross per 24 hour  Intake 149.9 ml  Output --  Net 149.9 ml    Physical Exam: General: Alert, awake, oriented x3, in no acute distress.  HEENT: [place]/AT PEERL, EOMI Neck: Trachea midline,  no masses, no thyromegal,y no JVD, no carotid bruit OROPHARYNX:  Moist, No exudate/ erythema/lesions.  Heart: Regular rate and rhythm, without murmurs, rubs, gallops, PMI non-displaced, no heaves or thrills on palpation.  Lungs: Clear to auscultation, no wheezing or rhonchi noted. No increased vocal fremitus resonant to percussion  Abdomen: Soft, nontender, nondistended, positive bowel sounds, no masses no hepatosplenomegaly noted..  Neuro: No focal neurological deficits noted cranial nerves II through XII grossly intact. DTRs 2+ bilaterally upper and lower extremities. Strength 5 out of 5 in bilateral upper and lower extremities. Musculoskeletal: No warm swelling or erythema around joints, no spinal tenderness noted. Psychiatric:  Patient alert and oriented x3, good insight and cognition, good recent to remote recall. Lymph node survey: No cervical axillary or inguinal lymphadenopathy noted.  Lab Results:  Basic Metabolic Panel:    Component Value Date/Time   NA 138 03/28/2022 1244   NA 142 07/02/2020 1040   K 5.3 (H) 03/28/2022 1244   CL 111 03/28/2022 1244   CO2 24 03/28/2022 1244   BUN 10 03/28/2022 1244   BUN 8 07/02/2020 1040   CREATININE 0.63 03/28/2022 1244   GLUCOSE 105 (H) 03/28/2022 1244   CALCIUM 9.4 03/28/2022 1244   CBC:    Component Value Date/Time   WBC 13.4 (H) 03/31/2022 0746   HGB 8.5 (L) 03/31/2022 0746   HGB 9.8 (L) 07/02/2020 1040   HCT 23.7 (L) 03/31/2022 0746   HCT 28.9 (L) 07/02/2020 1040   PLT 476 (H) 03/31/2022 0746   PLT 628 (H) 07/02/2020 1040   MCV 82.3 03/31/2022 0746   MCV 88 07/02/2020 1040   NEUTROABS 4.2 03/28/2022 1244   NEUTROABS 3.0 07/02/2020 1040   LYMPHSABS 4.1 (H) 03/28/2022 1244   LYMPHSABS 3.5 (H) 07/02/2020 1040   MONOABS 0.8 03/28/2022 1244   EOSABS 0.2 03/28/2022 1244   EOSABS 0.2 07/02/2020 1040   BASOSABS 0.0 03/28/2022 1244   BASOSABS 0.1 07/02/2020 1040    No results found for this or any previous visit (from the past 240 hour(s)).  Studies/Results: No results found.  Medications: Scheduled Meds:  citalopram  20 mg Oral Daily   DULoxetine  60 mg Oral Daily  enoxaparin (LOVENOX) injection  40 mg Subcutaneous Q24H   feeding supplement  237 mL Oral BID BM   folic acid  1 mg Oral Daily   HYDROmorphone   Intravenous Q4H   ketorolac  15 mg Intravenous Q6H   morphine  60 mg Oral Q12H   senna-docusate  1 tablet Oral BID   Continuous Infusions:  sodium chloride 10 mL/hr at 03/30/22 1855   PRN Meds:.cyclobenzaprine, diphenhydrAMINE, naloxone **AND** sodium chloride flush, ondansetron (ZOFRAN) IV, oxyCODONE, polyethylene glycol  Consultants: none  Procedures: none  Antibiotics: none  Assessment/Plan: Principal Problem:   Sickle  cell anemia with crisis ([place]) Active Problems:   Anemia of chronic disease   Tobacco abuse   Major depressive disorder   Chronic pain syndrome  Sickle cell disease with pain crisis: Continue IV Dilaudid PCA, weaning today.  Toradol 15 mg IV every 6 hours Oxycodone 10 mg every 4 hours while awake.  Monitor vital signs very closely, reevaluate pain scale regularly, and supplemental oxygen as needed.  Anemia of chronic disease: Hemoglobin is stable and consistent with patient's baseline.  There is no clinical indication for blood transfusion at this time.  Continue to monitor closely.  Chronic pain syndrome: Continue home medications  History of major depression: Stable.  Continue home medications.  No suicidal or homicidal intentions on today.  Continue to monitor patient closely.  Code Status: Full Code Family Communication: N/A Disposition Plan: Not yet ready for discharge.  Discharge in the next 2-3 days  Kieler, MSN, FNP-C Patient [facility] [place] [address] [place], [place] [address] [phone]  If 7PM-7AM, please contact night-coverage.  03/31/2022, 4:42 PM  LOS: 3 days

## 2022-04-01 DIAGNOSIS — D5700 Hb-SS disease with crisis, unspecified: Secondary | ICD-10-CM | POA: Diagnosis not present

## 2022-04-01 NOTE — Progress Notes (Signed)
Patient discharged to home. Discharge instructions reviewed with patient who verbalized understanding.

## 2022-04-01 NOTE — Discharge Summary (Signed)
Physician Discharge Summary  Carmen [name] UKG:[id number] DOB: 10-Mar-1991 DOA: 03/28/2022  PCP: Fenton Foy, NP  Admit date: 03/28/2022  Discharge date: 04/01/2022  Discharge Diagnoses:  Principal Problem:   Sickle cell anemia with crisis ([place]) Active Problems:   Anemia of chronic disease   Tobacco abuse   Major depressive disorder   Chronic pain syndrome   Discharge Condition: Stable  Disposition:   Follow-up Information     Fenton Foy, NP Follow up.   Specialty: Pulmonary Disease Contact information: [address], [place] [address] [phone]                Pt is discharged home in good condition and is to follow up with Fenton Foy, NP this week to have labs evaluated. Carmen [name] is instructed to increase activity slowly and balance with rest for the next few days, and use prescribed medication to complete treatment of pain  Diet: Regular Wt Readings from Last 3 Encounters:  03/28/22 86.2 kg  03/03/22 86.2 kg  02/01/22 83.9 kg    History of present illness:  Carmen [name] is a 31 year old female with a medical history significant for sickle cell disease, chronic pain syndrome, opiate dependence and tolerance, anemia of chronic disease, and major depressive disorder who presented to the emergency room with major complaints of generalized body pain, especially in her lower back and lower extremities that is consistent with her typical sickle cell pain crisis.  Patient attributes current pain crisis to changes in weather, its been cold lately.  Patient took her home medications with no sustained relief.  She rates her pain at 10/10 characterized as constant, throbbing, and achy.  She denies any fever, cough, chest pain, shortness of breath, nausea, vomiting, or diarrhea.  No urinary symptoms.  Hospital Course:  Sickle cell disease with pain crisis: Patient was admitted for sickle cell pain crisis and managed appropriately with IVF,  IV Dilaudid via PCA and IV Toradol, as well as other adjunct therapies per sickle cell pain management protocols.  Patient says that her pain intensity has improved and is requesting discharge home.  She will resume her home medications.  She will follow-up with her PCP within 2 weeks.  Patient was therefore discharged home today in a hemodynamically stable condition.   Carmen [name] will follow-up with PCP within 2 weeks of this discharge. Carmen [name] was counseled extensively about nonpharmacologic means of pain management, patient verbalized understanding and was appreciative of  the care received during this admission.   We discussed the need for good hydration, monitoring of hydration status, avoidance of heat, cold, stress, and infection triggers. We discussed the need to be adherent with taking home medications. Patient was reminded of the need to seek medical attention immediately if any symptom of bleeding, anemia, or infection occurs.  Discharge Exam: Vitals:   04/01/22 1302 04/01/22 1621  BP: 114/78   Pulse: 86   Resp: 20 18  Temp: 98.9 F (37.2 C)   SpO2: 99% 99%   Vitals:   04/01/22 0749 04/01/22 1139 04/01/22 1302 04/01/22 1621  BP:   114/78   Pulse:   86   Resp: '20 15 20 18  '$ Temp:   98.9 F (37.2 C)   TempSrc:   Oral   SpO2: 96% 96% 99% 99%  Weight:      Height:        General appearance : Awake, alert, not in any distress. Speech Clear. Not  toxic looking HEENT: Atraumatic and Normocephalic, pupils equally reactive to light and accomodation Neck: Supple, no JVD. No cervical lymphadenopathy.  Chest: Good air entry bilaterally, no added sounds  CVS: S1 S2 regular, no murmurs.  Abdomen: Bowel sounds present, Non tender and not distended with no gaurding, rigidity or rebound. Extremities: B/L Lower Ext shows no edema, both legs are warm to touch Neurology: Awake alert, and oriented X 3, CN II-XII intact, Non focal Skin: No Rash  Discharge Instructions  Discharge Instructions      Discharge patient   Complete by: As directed    Discharge disposition: 01-Home or Self Care   Discharge patient date: 04/01/2022      Allergies as of 04/01/2022       Reactions   Ketamine Hives, Other (See Comments)   Makes feel funny        Medication List     TAKE these medications    betamethasone valerate ointment 0.1 % Commonly known as: VALISONE APPLY TO AFFECTED AREA TWICE A DAY Strength: 0.1 %   citalopram 20 MG tablet Commonly known as: CELEXA TAKE 1 TABLET BY MOUTH EVERY DAY What changed: when to take this   cyclobenzaprine 10 MG tablet Commonly known as: FLEXERIL TAKE 1 TABLET BY MOUTH TWICE A DAY What changed:  when to take this reasons to take this   DULoxetine 30 MG capsule Commonly known as: CYMBALTA Take 2 capsules (60 mg total) by mouth daily.   folic acid 1 MG tablet Commonly known as: FOLVITE Take 1 tablet (1 mg total) by mouth daily. What changed: when to take this   Oxycodone HCl 10 MG Tabs Take 10 mg by mouth every 6 (six) hours as needed (pain).   Prenatal Vitamin Plus Low Iron 27-1 MG Tabs Take 1 tablet by mouth every morning.   Vitamin D (Ergocalciferol) 1.25 MG (50000 UNIT) Caps capsule Commonly known as: DRISDOL TAKE ONE CAPSULE BY MOUTH ONE TIME PER WEEK What changed:  how much to take how to take this when to take this additional instructions       ASK your doctor about these medications    morphine 60 MG 12 hr tablet Commonly known as: MS CONTIN Take 1 tablet (60 mg total) by mouth every 12 (twelve) hours. Ask about: Should I take this medication?        The results of significant diagnostics from this hospitalization (including imaging, microbiology, ancillary and laboratory) are listed below for reference.    Significant Diagnostic Studies: No results found.  Microbiology: No results found for this or any previous visit (from the past 240 hour(s)).   Labs: Basic Metabolic Panel: No results for  input(s): NA, K, CL, CO2, GLUCOSE, BUN, CREATININE, CALCIUM, MG, PHOS in the last 168 hours. Liver Function Tests: No results for input(s): AST, ALT, ALKPHOS, BILITOT, PROT, ALBUMIN in the last 168 hours. No results for input(s): LIPASE, AMYLASE in the last 168 hours. No results for input(s): AMMONIA in the last 168 hours. CBC: Recent Labs  Lab 03/30/22 0608 03/31/22 0746  WBC 11.8* 13.4*  HGB 9.7* 8.5*  HCT 27.5* 23.7*  MCV 83.6 82.3  PLT 293 476*   Cardiac Enzymes: No results for input(s): CKTOTAL, CKMB, CKMBINDEX, TROPONINI in the last 168 hours. BNP: Invalid input(s): POCBNP CBG: No results for input(s): GLUCAP in the last 168 hours.  Time coordinating discharge: 30 minutes  Signed: [name]  APRN, MSN, FNP-C Patient [place]  [place], [place] [phone]  [facility] 04/05/2022, 4:18 PM

## 2022-04-05 ENCOUNTER — Telehealth: Payer: Self-pay

## 2022-04-05 NOTE — Telephone Encounter (Signed)
Prior Authorization started on Oxycodone HCI '10mg'$  tabs waiting on insurance approval Confirmation #:[id number] W

## 2022-04-06 ENCOUNTER — Telehealth: Payer: Self-pay

## 2022-04-06 NOTE — Telephone Encounter (Signed)
Prior Approval #:[id number] [place] for Oxycodone HCL 10 mg tabs  Type:PHARMACYRecipient:Carmen R SIMMONSRecipient BM:[id number] OBilling   Provider:Billing Provider FR:[id number] Provider Name:THE [facility] [place] Provider CQ:[id number] Submission Date:06/05/2023Status:APPROVEDEffective Begin Date:06/05/2023Effective End Date:12/02/2023Payer:[employer] BENEFITS# of Attachments:1

## 2022-04-06 NOTE — Telephone Encounter (Signed)
Morphine 60 mg

## 2022-04-07 ENCOUNTER — Other Ambulatory Visit: Payer: Self-pay | Admitting: Nurse Practitioner

## 2022-04-07 ENCOUNTER — Other Ambulatory Visit: Payer: Self-pay | Admitting: Family Medicine

## 2022-04-07 ENCOUNTER — Inpatient Hospital Stay (HOSPITAL_COMMUNITY)
Admission: EM | Admit: 2022-04-07 | Discharge: 2022-04-12 | DRG: 812 | Disposition: A | Discharge status: Home / Self Care | Payer: Medicaid Other | Attending: Internal Medicine | Admitting: Internal Medicine

## 2022-04-07 ENCOUNTER — Encounter (HOSPITAL_COMMUNITY): Payer: Self-pay

## 2022-04-07 ENCOUNTER — Emergency Department (HOSPITAL_COMMUNITY): Payer: Medicaid Other

## 2022-04-07 ENCOUNTER — Telehealth: Payer: Self-pay | Admitting: Nurse Practitioner

## 2022-04-07 DIAGNOSIS — G894 Chronic pain syndrome: Secondary | ICD-10-CM | POA: Diagnosis present

## 2022-04-07 DIAGNOSIS — F1120 Opioid dependence, uncomplicated: Secondary | ICD-10-CM | POA: Diagnosis present

## 2022-04-07 DIAGNOSIS — Z79891 Long term (current) use of opiate analgesic: Secondary | ICD-10-CM

## 2022-04-07 DIAGNOSIS — Z1152 Encounter for screening for COVID-19: Secondary | ICD-10-CM | POA: Diagnosis not present

## 2022-04-07 DIAGNOSIS — Z841 Family history of disorders of kidney and ureter: Secondary | ICD-10-CM

## 2022-04-07 DIAGNOSIS — Z8249 Family history of ischemic heart disease and other diseases of the circulatory system: Secondary | ICD-10-CM

## 2022-04-07 DIAGNOSIS — F17200 Nicotine dependence, unspecified, uncomplicated: Secondary | ICD-10-CM | POA: Diagnosis present

## 2022-04-07 DIAGNOSIS — M545 Low back pain, unspecified: Secondary | ICD-10-CM

## 2022-04-07 DIAGNOSIS — Z720 Tobacco use: Secondary | ICD-10-CM | POA: Diagnosis not present

## 2022-04-07 DIAGNOSIS — Z8261 Family history of arthritis: Secondary | ICD-10-CM

## 2022-04-07 DIAGNOSIS — Z79899 Other long term (current) drug therapy: Secondary | ICD-10-CM | POA: Diagnosis not present

## 2022-04-07 DIAGNOSIS — Z9081 Acquired absence of spleen: Secondary | ICD-10-CM

## 2022-04-07 DIAGNOSIS — D5700 Hb-SS disease with crisis, unspecified: Secondary | ICD-10-CM | POA: Diagnosis present

## 2022-04-07 DIAGNOSIS — F329 Major depressive disorder, single episode, unspecified: Secondary | ICD-10-CM | POA: Diagnosis present

## 2022-04-07 DIAGNOSIS — R112 Nausea with vomiting, unspecified: Secondary | ICD-10-CM | POA: Diagnosis present

## 2022-04-07 DIAGNOSIS — Z9049 Acquired absence of other specified parts of digestive tract: Secondary | ICD-10-CM | POA: Diagnosis not present

## 2022-04-07 DIAGNOSIS — Z832 Family history of diseases of the blood and blood-forming organs and certain disorders involving the immune mechanism: Secondary | ICD-10-CM | POA: Diagnosis not present

## 2022-04-07 DIAGNOSIS — D571 Sickle-cell disease without crisis: Secondary | ICD-10-CM

## 2022-04-07 DIAGNOSIS — D638 Anemia in other chronic diseases classified elsewhere: Secondary | ICD-10-CM | POA: Diagnosis present

## 2022-04-07 DIAGNOSIS — F17210 Nicotine dependence, cigarettes, uncomplicated: Secondary | ICD-10-CM | POA: Diagnosis present

## 2022-04-07 LAB — RETICULOCYTES
Immature Retic Fract: 37.5 % — ABNORMAL HIGH (ref 2.3–15.9)
RBC.: 3.45 MIL/uL — ABNORMAL LOW (ref 3.87–5.11)
Retic Count, Absolute: 200.1 10*3/uL — ABNORMAL HIGH (ref 19.0–186.0)
Retic Ct Pct: 5.8 % — ABNORMAL HIGH (ref 0.4–3.1)

## 2022-04-07 LAB — COMPREHENSIVE METABOLIC PANEL
ALT: 44 U/L (ref 0–44)
AST: 30 U/L (ref 15–41)
Albumin: 4.5 g/dL (ref 3.5–5.0)
Alkaline Phosphatase: 70 U/L (ref 38–126)
Anion gap: 6 (ref 5–15)
BUN: 10 mg/dL (ref 6–20)
CO2: 23 mmol/L (ref 22–32)
Calcium: 9.6 mg/dL (ref 8.9–10.3)
Chloride: 110 mmol/L (ref 98–111)
Creatinine, Ser: 0.6 mg/dL (ref 0.44–1.00)
GFR, Estimated: >60 mL/min (ref >60)
Glucose, Bld: 93 mg/dL (ref 70–99)
Potassium: 3.7 mmol/L (ref 3.5–5.1)
Sodium: 139 mmol/L (ref 135–145)
Total Bilirubin: 0.9 mg/dL (ref 0.3–1.2)
Total Protein: 8.7 g/dL — ABNORMAL HIGH (ref 6.5–8.1)

## 2022-04-07 LAB — CBC WITH DIFFERENTIAL/PLATELET
Abs Immature Granulocytes: 0.03 10*3/uL (ref 0.00–0.07)
Basophils Absolute: 0 10*3/uL (ref 0.0–0.1)
Basophils Relative: 0 %
Eosinophils Absolute: 0.2 10*3/uL (ref 0.0–0.5)
Eosinophils Relative: 2 %
HCT: 29 % — ABNORMAL LOW (ref 36.0–46.0)
Hemoglobin: 10.5 g/dL — ABNORMAL LOW (ref 12.0–15.0)
Immature Granulocytes: 0 %
Lymphocytes Relative: 29 %
Lymphs Abs: 2.7 10*3/uL (ref 0.7–4.0)
MCH: 30.4 pg (ref 26.0–34.0)
MCHC: 36.2 g/dL — ABNORMAL HIGH (ref 30.0–36.0)
MCV: 84.1 fL (ref 80.0–100.0)
Monocytes Absolute: 0.7 10*3/uL (ref 0.1–1.0)
Monocytes Relative: 8 %
Neutro Abs: 5.6 10*3/uL (ref 1.7–7.7)
Neutrophils Relative %: 61 %
Platelets: 521 10*3/uL — ABNORMAL HIGH (ref 150–400)
RBC: 3.45 MIL/uL — ABNORMAL LOW (ref 3.87–5.11)
RDW: 14.6 % (ref 11.5–15.5)
WBC: 9.3 10*3/uL (ref 4.0–10.5)
nRBC: 0.4 % — ABNORMAL HIGH (ref 0.0–0.2)

## 2022-04-07 LAB — I-STAT BETA HCG BLOOD, ED (MC, WL, AP ONLY): I-stat hCG, quantitative: <5 m[IU]/mL (ref <5)

## 2022-04-07 LAB — SARS CORONAVIRUS 2 BY RT PCR: SARS Coronavirus 2 by RT PCR: NEGATIVE

## 2022-04-07 MED ORDER — HYDROMORPHONE HCL 2 MG/ML IJ SOLN
2.0000 mg | INTRAMUSCULAR | Status: AC
  Administered 2022-04-07: 2 mg via SUBCUTANEOUS
  Filled 2022-04-07: qty 1

## 2022-04-07 MED ORDER — NALOXONE HCL 0.4 MG/ML IJ SOLN: 0.4000 mg | INTRAMUSCULAR | Status: DC | PRN

## 2022-04-07 MED ORDER — MORPHINE SULFATE ER 60 MG PO TBCR: 60.0000 mg | EXTENDED_RELEASE_TABLET | Freq: Two times a day (BID) | ORAL | 0 refills | Status: DC

## 2022-04-07 MED ORDER — DIPHENHYDRAMINE HCL 25 MG PO CAPS: 25.0000 mg | ORAL_CAPSULE | ORAL | Status: DC | PRN

## 2022-04-07 MED ORDER — KETOROLAC TROMETHAMINE 15 MG/ML IJ SOLN
15.0000 mg | INTRAMUSCULAR | Status: AC
  Administered 2022-04-07: 15 mg via INTRAVENOUS
  Filled 2022-04-07: qty 1

## 2022-04-07 MED ORDER — HYDROMORPHONE HCL 1 MG/ML IJ SOLN
1.0000 mg | INTRAMUSCULAR | Status: DC | PRN
  Administered 2022-04-07: 1 mg via INTRAVENOUS
  Filled 2022-04-07: qty 1

## 2022-04-07 MED ORDER — NICOTINE 14 MG/24HR TD PT24
14.0000 mg | MEDICATED_PATCH | Freq: Every day | TRANSDERMAL | Status: DC
  Filled 2022-04-07 (×4): qty 1

## 2022-04-07 MED ORDER — OXYCODONE HCL 5 MG PO TABS
15.0000 mg | ORAL_TABLET | Freq: Once | ORAL | Status: AC
  Administered 2022-04-07: 15 mg via ORAL
  Filled 2022-04-07: qty 3

## 2022-04-07 MED ORDER — ONDANSETRON HCL 4 MG/2ML IJ SOLN: 4.0000 mg | Freq: Four times a day (QID) | INTRAMUSCULAR | Status: DC | PRN

## 2022-04-07 MED ORDER — ONDANSETRON 4 MG PO TBDP
8.0000 mg | ORAL_TABLET | Freq: Three times a day (TID) | ORAL | Status: DC | PRN
  Administered 2022-04-07: 8 mg via ORAL
  Filled 2022-04-07: qty 1

## 2022-04-07 MED ORDER — HYDROMORPHONE 1 MG/ML IV SOLN
INTRAVENOUS | Status: DC
  Administered 2022-04-07: 30 mg via INTRAVENOUS
  Administered 2022-04-08: 4.5 mL via INTRAVENOUS
  Administered 2022-04-08: 3 mg via INTRAVENOUS
  Administered 2022-04-08: 4.5 mL via INTRAVENOUS
  Administered 2022-04-08: 3.5 mg via INTRAVENOUS
  Filled 2022-04-07: qty 30

## 2022-04-07 MED ORDER — SODIUM CHLORIDE 0.9% FLUSH: 9.0000 mL | INTRAVENOUS | Status: DC | PRN

## 2022-04-07 NOTE — ED Provider Triage Note (Signed)
Emergency Medicine Provider Triage Evaluation Note  Carmen [name] , a 31 y.o. female  was evaluated in triage.  Pt complains of sickle cell pain crisis. Patient states that same began on Monday and has been worsening since.  He states that her pain is located predominantly in her back and both her legs.  She states this is typical for her sickle cell pain crises. She states that her home meds are not helping. Attributes this flare to weather changes.  She denies fevers, chills, chest pain, or shortness of breath.  Review of Systems  Positive:  Negative: See above  Physical Exam  BP 135/83   Pulse 100   Temp 98.7 F (37.1 C) (Oral)   Resp 18   Ht '5\' 3"'$  (1.6 m)   Wt 86.2 kg   SpO2 100%   BMI 33.66 kg/m  Gen:   Awake, tearful Resp:  Normal effort  MSK:   Moves extremities without difficulty Other:    Medical Decision Making  Medically screening exam initiated at 1:39 PM.  Appropriate orders placed.  Carmen [name] was informed that the remainder of the evaluation will be completed by another provider, this initial triage assessment does not replace that evaluation, and the importance of remaining in the ED until their evaluation is complete.  Work-up initiated   Carmen [name] 04/07/22 1346

## 2022-04-07 NOTE — Telephone Encounter (Signed)
Morphine 60 mg refill request

## 2022-04-07 NOTE — ED Notes (Signed)
Pt is a hard stick,.   Tried but could not get good blood flow.

## 2022-04-07 NOTE — ED Triage Notes (Signed)
Pt reports back and leg pain since she was here last week. Pt reports trying home medications without relief. Hx of sickle cell.

## 2022-04-07 NOTE — Assessment & Plan Note (Signed)
-   Spoke about importance of quitting spent 5 minutes discussing options for treatment, prior attempts at quitting, and dangers of smoking  -At this point patient is  NOT  interested in quitting  - order nicotine patch   - nursing tobacco cessation protocol

## 2022-04-07 NOTE — Subjective & Objective (Signed)
Intractable pain of the back and hips bilaterally In ER had 3 rounds of dilaudid  Was not able to control pain at home  No CP no SOB Had recent hospitalization last wk

## 2022-04-07 NOTE — ED Provider Notes (Signed)
[facility] DEPT Provider Note   CSN: [id number] Arrival date & time: 04/07/22  1313     History  Chief Complaint  Patient presents with   Sickle Cell Pain Crisis    Carmen [name] is a 31 y.o. female.   Sickle Cell Pain Crisis  31 year old female with a history of sickle cell disease who presents to the emergency department with complaint of pain associated with her known sickle cell pain.  The patient states that she has had back pain and pain in both of her legs.  She states that this is typical of her typical sickle pain crises.  She denies any chest pain or shortness of breath, fevers or chills.  She has been taking her home medications without relief.  She was recently admitted to the hospital and was discharged this past Monday and had felt well enough for discharge but at this time symptoms have returned and persisted.  Home Medications Prior to Admission medications   Medication Sig Start Date End Date Taking? Authorizing Provider  betamethasone valerate ointment (VALISONE) 0.1 % APPLY TO AFFECTED AREA TWICE A DAY 04/07/22   [name], FNP  citalopram (CELEXA) 20 MG tablet Take 1 tablet (20 mg total) by mouth every morning. 04/07/22   [name], FNP  cyclobenzaprine (FLEXERIL) 10 MG tablet Take 1 tablet (10 mg total) by mouth daily as needed for muscle spasms. 04/07/22   [name], FNP  DULoxetine (CYMBALTA) 30 MG capsule Take 2 capsules (60 mg total) by mouth daily. 02/04/22 08/03/22  [name], FNP  folic acid (FOLVITE) 1 MG tablet Take 1 tablet (1 mg total) by mouth daily. Patient taking differently: Take 1 mg by mouth every morning. 03/15/19   Lanae Boast, FNP  morphine (MS CONTIN) 60 MG 12 hr tablet Take 1 tablet (60 mg total) by mouth every 12 (twelve) hours. 04/07/22   [name], FNP  Oxycodone HCl 10 MG TABS Take 10 mg by mouth every 6 (six) hours as needed (pain).    [provider]  Prenatal  Vit-Fe Fumarate-FA (PRENATAL VITAMIN PLUS LOW IRON) 27-1 MG TABS Take 1 tablet by mouth every morning.    [provider]  Vitamin D, Ergocalciferol, (DRISDOL) 1.25 MG (50000 UNIT) CAPS capsule TAKE ONE CAPSULE BY MOUTH ONE TIME PER WEEK Patient taking differently: Take 50,000 Units by mouth every Wednesday. 09/03/20   Vevelyn Francois, NP      Allergies    Ketamine    Review of Systems   Review of Systems  All other systems reviewed and are negative.  Physical Exam Updated Vital Signs BP (!) 107/52   Pulse (!) 109   Temp 98.7 F (37.1 C) (Oral)   Resp (!) 23   Ht '5\' 3"'$  (1.6 m)   Wt 86.2 kg   SpO2 99%   BMI 33.66 kg/m  Physical Exam Vitals and nursing note reviewed.  Constitutional:      General: She is not in acute distress.    Appearance: She is well-developed. She is obese.  HENT:     Head: Normocephalic and atraumatic.  Eyes:     Conjunctiva/sclera: Conjunctivae normal.  Cardiovascular:     Rate and Rhythm: Normal rate and regular rhythm.  Pulmonary:     Effort: Pulmonary effort is normal. No respiratory distress.     Breath sounds: Normal breath sounds.  Abdominal:     Palpations: Abdomen is soft.  Tenderness: There is no abdominal tenderness.  Musculoskeletal:        General: No swelling.     Cervical back: Neck supple.  Skin:    General: Skin is warm and dry.     Capillary Refill: Capillary refill takes less than 2 seconds.  Neurological:     Mental Status: She is alert.  Psychiatric:        Mood and Affect: Mood normal.    ED Results / Procedures / Treatments   Labs (all labs ordered are listed, but only abnormal results are displayed) Labs Reviewed  CBC WITH DIFFERENTIAL/PLATELET - Abnormal; Notable for the following components:      Result Value   RBC 3.45 (*)    Hemoglobin 10.5 (*)    HCT 29.0 (*)    MCHC 36.2 (*)    Platelets 521 (*)    nRBC 0.4 (*)    All other components within normal limits  COMPREHENSIVE METABOLIC PANEL -  Abnormal; Notable for the following components:   Total Protein 8.7 (*)    All other components within normal limits  RETICULOCYTES - Abnormal; Notable for the following components:   Retic Ct Pct 5.8 (*)    RBC. 3.45 (*)    Retic Count, Absolute 200.1 (*)    Immature Retic Fract 37.5 (*)    All other components within normal limits  SARS CORONAVIRUS 2 BY RT PCR  I-STAT BETA HCG BLOOD, ED ([facility], [facility], [facility] ONLY)    EKG EKG Interpretation  Date/Time:  Wednesday April 07 2022 16:03:13 EDT Ventricular Rate:  85 PR Interval:  148 QRS Duration: 89 QT Interval:  361 QTC Calculation: 430 R Axis:   47 Text Interpretation: Sinus rhythm Confirmed by Regan Lemming ([id number]) on 04/07/2022 4:10:11 PM  Radiology No results found.  Procedures Procedures    Medications Ordered in ED Medications  diphenhydrAMINE (BENADRYL) capsule 25-50 mg (has no administration in time range)  ondansetron (ZOFRAN-ODT) disintegrating tablet 8 mg (8 mg Oral Given 04/07/22 1658)  ketorolac (TORADOL) 15 MG/ML injection 15 mg (15 mg Intravenous Given 04/07/22 1657)  HYDROmorphone (DILAUDID) injection 2 mg (2 mg Subcutaneous Given 04/07/22 1657)  HYDROmorphone (DILAUDID) injection 2 mg (2 mg Subcutaneous Given 04/07/22 1746)  HYDROmorphone (DILAUDID) injection 2 mg (2 mg Subcutaneous Given 04/07/22 1818)  oxyCODONE (Oxy IR/ROXICODONE) immediate release tablet 15 mg (15 mg Oral Given 04/07/22 1841)    ED Course/ Medical Decision Making/ A&P                           Medical Decision Making Amount and/or Complexity of Data Reviewed Radiology: ordered.  Risk Prescription drug management. Decision regarding hospitalization.    31 year old female with a history of sickle cell disease who presents to the emergency department with complaint of pain associated with her known sickle cell pain.  The patient states that she has had back pain and pain in both of her legs.  She states that this is typical of her typical sickle pain  crises.  She denies any chest pain or shortness of breath, fevers or chills.  She has been taking her home medications without relief.  She was recently admitted to the hospital and was discharged this past Monday and had felt well enough for discharge but at this time symptoms have returned and persisted.   Currently she is awake, alert, hemodynamically stable, and afebrile. Her exam is most notable for no tachypnea, no focal pulmonary findings,  no difficultly with ambulation, normal capillary refill, and no significant splenomegaly.    Due to the patient's presentation, I am most concerned for sickle cell pain crisis.  To further evaluate and risk stratify her, labs and imaging were obtained, which were significant for:  Labs: Interpreted myself significant for hemoglobin improved from baseline at 10.5, reticulocyte count elevated 200, CMP without significant electrolyte abnormality, normal renal and liver function.  hCG normal.  I do not think as or is experiencing an acute stroke, sequestration crisis, aplastic anemia, Acute Chest Syndrome, septic arthritis, hyphema, or sepsis.  While in the ED, the patient received: Medications  diphenhydrAMINE (BENADRYL) capsule 25-50 mg (has no administration in time range)  ondansetron (ZOFRAN-ODT) disintegrating tablet 8 mg (8 mg Oral Given 04/07/22 1658)  ketorolac (TORADOL) 15 MG/ML injection 15 mg (15 mg Intravenous Given 04/07/22 1657)  HYDROmorphone (DILAUDID) injection 2 mg (2 mg Subcutaneous Given 04/07/22 1657)  HYDROmorphone (DILAUDID) injection 2 mg (2 mg Subcutaneous Given 04/07/22 1746)  HYDROmorphone (DILAUDID) injection 2 mg (2 mg Subcutaneous Given 04/07/22 1818)  oxyCODONE (Oxy IR/ROXICODONE) immediate release tablet 15 mg (15 mg Oral Given 04/07/22 1841)    On reassessment, the patient continues to endorse severe pain in her hips bilaterally in addition to back pain.  With her hip pain, considered avascular necrosis.  XR imaging of the hips  ordered to screen with consideration given to potential MRI imaging if patient continues to have symptoms. On reassessment, patient has intact ROM of the hips and is able to bear weight. Favor likely sickle cell pain versus developing avascular necrosis.  Hospitalist medicine consulted for admission, Dr. Roel Cluck accepting.  Final Clinical Impression(s) / ED Diagnoses Final diagnoses:  Sickle cell pain crisis [facility])    Rx / DC Orders ED Discharge Orders     None         Regan Lemming, MD 04/07/22 1927

## 2022-04-07 NOTE — H&P (Signed)
[name]:[id number] DOB: [birth year] DOA: 04/07/2022     PCP: [name], FNP      Patient arrived to ER on 04/07/22 at 1313 Referred by Attending Regan Lemming, MD   Patient coming from:    home Lives a With family    Chief Complaint:   Chief Complaint  Patient presents with   Sickle Cell Pain Crisis    HPI: Carmen [name] is a 31 y.o. female with medical history significant of  sickle cell disease, chronic pain syndrome, opiate dependence and tolerance, anemia of chronic disease and major depressive disorder tobacco abuse  Presented with   back and leg pain Intractable pain of the back and hips bilaterally In ER had 3 rounds of dilaudid  Was not able to control pain at home  No CP no SOB Had recent hospitalization last wk   Pt is on MS contin scheduled and oxycodone PRN Denies running out of meds  Smokes still not interested in [place]  house  PCR testing  Pending  Lab Results  Component Value Date   [place] 12/31/2021   [place] NEGATIVE 12/02/2021   [place] NEGATIVE 11/01/2021   [place] NEGATIVE 10/26/2021     Regarding pertinent Chronic problems:     Chronic anemia - baseline hg Hemoglobin & Hematocrit  Recent Labs    03/30/22 0608 03/31/22 0746 04/07/22 1647  HGB 9.7* 8.5* 10.5*     While in ER:   Given 3 doses of Dilaudid with no improvement in pain control hemoglobin noted to be stable  Reticulocyte count 5.8 total bili 0.9  Ordered Hips bilaterally nonacute no fracture  Following Medications were ordered in ER: Medications  diphenhydrAMINE (BENADRYL) capsule 25-50 mg (has no administration in time range)  ondansetron (ZOFRAN-ODT) disintegrating tablet 8 mg (8 mg Oral Given 04/07/22 1658)  ketorolac (TORADOL) 15 MG/ML injection 15 mg (15 mg Intravenous Given 04/07/22 1657)  HYDROmorphone (DILAUDID) injection 2 mg (2 mg Subcutaneous Given 04/07/22 1657)  HYDROmorphone (DILAUDID) injection 2 mg (2 mg  Subcutaneous Given 04/07/22 1746)  HYDROmorphone (DILAUDID) injection 2 mg (2 mg Subcutaneous Given 04/07/22 1818)  oxyCODONE (Oxy IR/ROXICODONE) immediate release tablet 15 mg (15 mg Oral Given 04/07/22 1841)       ED Triage Vitals  Enc Vitals Group     BP 04/07/22 1320 135/83     Pulse Rate 04/07/22 1320 100     Resp 04/07/22 1320 18     Temp 04/07/22 1320 98.7 F (37.1 C)     Temp Source 04/07/22 1320 Oral     SpO2 04/07/22 1320 100 %     Weight 04/07/22 1320 190 lb (86.2 kg)     Height 04/07/22 1320 '5\' 3"'$  (1.6 m)     Head Circumference --      Peak Flow --      Pain Score 04/07/22 1325 9     Pain Loc --      Pain Edu? --      Excl. in [place]? --   TMAX(24)@     _________________________________________ Significant initial  Findings: Abnormal Labs Reviewed  CBC WITH DIFFERENTIAL/PLATELET - Abnormal; Notable for the following components:      Result Value   RBC 3.45 (*)    Hemoglobin 10.5 (*)    HCT 29.0 (*)    MCHC 36.2 (*)    Platelets 521 (*)    nRBC 0.4 (*)    All other components within normal limits  COMPREHENSIVE  METABOLIC PANEL - Abnormal; Notable for the following components:   Total Protein 8.7 (*)    All other components within normal limits  RETICULOCYTES - Abnormal; Notable for the following components:   Retic Ct Pct 5.8 (*)    RBC. 3.45 (*)    Retic Count, Absolute 200.1 (*)    Immature Retic Fract 37.5 (*)    All other components within normal limits     ECG: Ordered Personally reviewed by me showing: HR : 85 Rhythm:Sinus rhythm QTC 430    The recent clinical data is shown below. Vitals:   04/07/22 1700 04/07/22 1800 04/07/22 1830 04/07/22 1900  BP: 122/85 121/68 117/73 (!) 107/52  Pulse: 79 99 (!) 107 (!) 109  Resp: (!) '26 15 17 '$ (!) 23  Temp:      TempSrc:      SpO2: 100% 98% 99% 99%  Weight:      Height:         WBC     Component Value Date/Time   WBC 9.3 04/07/2022 1647   LYMPHSABS 2.7 04/07/2022 1647   LYMPHSABS 3.5 (H) 07/02/2020  1040   MONOABS 0.7 04/07/2022 1647   EOSABS 0.2 04/07/2022 1647   EOSABS 0.2 07/02/2020 1040   BASOSABS 0.0 04/07/2022 1647   BASOSABS 0.1 07/02/2020 1040       _______________________________________________ Hospitalist was called for admission for   Sickle cell pain crisis    The following Work up has been ordered so far:  Orders Placed This Encounter  Procedures   SARS Coronavirus 2 by RT PCR (hospital order, performed in [facility] hospital lab) *cepheid single result test* Anterior Nasal Swab   DG Hips Bilat W or Wo Pelvis 3-4 Views   CBC with Differential   Comprehensive metabolic panel   Reticulocytes   Vital signs with O2 sat, q1hour   Cardiac monitoring   Monitor O2 SATs   Weigh patient in Kg   Saline Lock IV-Maintain IV access   Patient may eat/drink   Initiate Carrier Fluid Protocol   Consult to hospitalist   I-Stat beta hCG blood, ED   EKG 12-Lead     OTHER Significant initial  Findings:  labs showing:    Recent Labs  Lab 04/07/22 1647  NA 139  K 3.7  CO2 23  GLUCOSE 93  BUN 10  CREATININE 0.60  CALCIUM 9.6    Cr    stable,    Lab Results  Component Value Date   CREATININE 0.60 04/07/2022   CREATININE 0.63 03/28/2022   CREATININE 0.54 03/02/2022    Recent Labs  Lab 04/07/22 1647  AST 30  ALT 44  ALKPHOS 70  BILITOT 0.9  PROT 8.7*  ALBUMIN 4.5   Lab Results  Component Value Date   CALCIUM 9.6 04/07/2022   PHOS 3.6 05/22/2021        Plt: Lab Results  Component Value Date   PLT 521 (H) 04/07/2022        Recent Labs  Lab 04/07/22 1647  WBC 9.3  NEUTROABS 5.6  HGB 10.5*  HCT 29.0*  MCV 84.1  PLT 521*    HG/HCT   stable,     Component Value Date/Time   HGB 10.5 (L) 04/07/2022 1647   HGB 9.8 (L) 07/02/2020 1040   HCT 29.0 (L) 04/07/2022 1647   HCT 28.9 (L) 07/02/2020 1040   MCV 84.1 04/07/2022 1647   MCV 88 07/02/2020 1040         Cultures:  Component Value Date/Time   SDES  09/06/2021 0709    BLOOD SITE  NOT SPECIFIED Performed at [facility] [address] [place], [place] [address]    SPECREQUEST  09/06/2021 6063    BOTTLES DRAWN AEROBIC AND ANAEROBIC Blood Culture adequate volume Performed at [facility] [address] [place], [place] [address]    CULT  09/06/2021 0709    NO GROWTH 5 DAYS Performed at [facility], [place] [address] [place], [place] [address]    REPTSTATUS 09/11/2021 FINAL 09/06/2021 5573     Radiological Exams on Admission: No results found. _______________________________________________________________________________________________________ Latest  Blood pressure (!) 107/52, pulse (!) 109, temperature 98.7 F (37.1 C), temperature source Oral, resp. rate (!) 23, height '5\' 3"'$  (1.6 m), weight 86.2 kg, SpO2 99 %.   Vitals  labs and radiology finding personally reviewed  Review of Systems:    Pertinent positives include:   fatigue, leg pain, back pain.   Constitutional:  No weight loss, night sweats, Fevers, chills,weight loss  HEENT:  No headaches, Difficulty swallowing,Tooth/dental problems,Sore throat,  No sneezing, itching, ear ache, nasal congestion, post nasal drip,  Cardio-vascular:  No chest pain, Orthopnea, PND, anasarca, dizziness, palpitations.no Bilateral lower extremity swelling  GI:  No heartburn, indigestion, abdominal pain, nausea, vomiting, diarrhea, change in bowel habits, loss of appetite, melena, blood in stool, hematemesis Resp:  no shortness of breath at rest. No dyspnea on exertion, No excess mucus, no productive cough, No non-productive cough, No coughing up of blood.No change in color of mucus.No wheezing. Skin:  no rash or lesions. No jaundice GU:  no dysuria, change in color of urine, no urgency or frequency. No straining to urinate.  No flank pain.  Musculoskeletal:  No joint pain or no joint swelling. No decreased range of motion. No  Psych:  No change in mood or affect. No  depression or anxiety. No memory loss.  Neuro: no localizing neurological complaints, no tingling, no weakness, no double vision, no gait abnormality, no slurred speech, no confusion  All systems reviewed and apart from [place] all are negative _______________________________________________________________________________________________ Past Medical History:   Past Medical History:  Diagnosis Date   Anxiety    Headache(784.0)    Heart murmur    Sickle cell crisis ([place])    Syphilis 2015   Was diagnosed and received one injection of antibiotics   Thrombocytosis 11/22/2014     CBC Latest Ref Rng & Units 12/13/2018 12/11/2018 12/10/2018 WBC 4.0 - 10.5 K/uL 14.8(H) 13.2(H) 15.9(H) Hemoglobin 12.0 - 15.0 g/dL 8.2(L) 7.7(L) 8.1(L) Hematocrit 36.0 - 46.0 % 23.7(L) 23.2(L) 24.5(L) Platelets 150 - 400 K/uL 326 399 388        Past Surgical History:  Procedure Laterality Date   CESAREAN SECTION N/A 02/05/2019   Procedure: CESAREAN SECTION;  Surgeon: [name], MD;  Location: [facility] LD ORS;  Service: Obstetrics;  Laterality: N/A;   CHOLECYSTECTOMY N/A 11/30/2014   Procedure: LAPAROSCOPIC CHOLECYSTECTOMY SINGLE SITE WITH INTRAOPERATIVE CHOLANGIOGRAM;  Surgeon: Michael Boston, MD;  Location: [facility] ORS;  Service: General;  Laterality: N/A;   SPLENECTOMY      Social History:  Ambulatory   independently        reports that she has been smoking cigarettes. She has never used smokeless tobacco. She reports that she does not drink alcohol and does not use drugs.    Family History:   Family History  Problem Relation Age of Onset   Hypertension Mother  Sickle cell anemia Sister    Kidney disease Sister        Lupus   Arthritis Sister    Sickle cell anemia Sister    Sickle cell trait Sister    Heart disease Maternal Aunt        CABG   Heart disease Maternal Uncle        CABG   Lupus Sister     ______________________________________________________________________________________________ Allergies: Allergies  Allergen Reactions   Ketamine Hives and Other (See Comments)    Makes feel funny     Prior to Admission medications   Medication Sig Start Date End Date Taking? Authorizing Provider  betamethasone valerate ointment (VALISONE) 0.1 % APPLY TO AFFECTED AREA TWICE A DAY 04/07/22   [name], FNP  citalopram (CELEXA) 20 MG tablet Take 1 tablet (20 mg total) by mouth every morning. 04/07/22   [name], FNP  cyclobenzaprine (FLEXERIL) 10 MG tablet Take 1 tablet (10 mg total) by mouth daily as needed for muscle spasms. 04/07/22   [name], FNP  DULoxetine (CYMBALTA) 30 MG capsule Take 2 capsules (60 mg total) by mouth daily. 02/04/22 08/03/22  [name], FNP  folic acid (FOLVITE) 1 MG tablet Take 1 tablet (1 mg total) by mouth daily. Patient taking differently: Take 1 mg by mouth every morning. 03/15/19   Lanae Boast, FNP  morphine (MS CONTIN) 60 MG 12 hr tablet Take 1 tablet (60 mg total) by mouth every 12 (twelve) hours. 04/07/22   [name], FNP  Oxycodone HCl 10 MG TABS Take 10 mg by mouth every 6 (six) hours as needed (pain).    [provider]  Prenatal Vit-Fe Fumarate-FA (PRENATAL VITAMIN PLUS LOW IRON) 27-1 MG TABS Take 1 tablet by mouth every morning.    [provider]  Vitamin D, Ergocalciferol, (DRISDOL) 1.25 MG (50000 UNIT) CAPS capsule TAKE ONE CAPSULE BY MOUTH ONE TIME PER WEEK Patient taking differently: Take 50,000 Units by mouth every Wednesday. 09/03/20   Vevelyn Francois, NP    ___________________________________________________________________________________________________ Physical Exam:    04/07/2022    7:00 PM 04/07/2022    6:30 PM 04/07/2022    6:00 PM  Vitals with BMI  Systolic 315 176 160  Diastolic 52 73 68  Pulse 737 107 99     1. General:  in No  Acute distress   Chronically ill  -appearing 2.  Psychological: Alert and  Oriented 3. Head/ENT:   Dry Mucous Membranes                          Head Non traumatic, neck supple                          Normal  Dentition 4. SKIN:  decreased Skin turgor,  Skin clean Dry and intact no rash 5. Heart: Regular rate and rhythm no  Murmur, no Rub or gallop 6. Lungs:  Clear to auscultation bilaterally, no wheezes or crackles   7. Abdomen: Soft,  non-tender, Non distended   obese  bowel sounds present 8. Lower extremities: no clubbing, cyanosis, no  edema 9. Neurologically Grossly intact, moving all 4 extremities equally     Chart has been reviewed  ______________________________________________________________________________________________  Assessment/Plan  31 y.o. female with medical history significant of  sickle cell disease, chronic pain syndrome, opiate dependence and tolerance, anemia of chronic disease and major depressive disorder tobacco  abuse  Admitted for   Sickle cell pain crisis ([place])     Present on Admission:  Sickle cell pain crisis ([place])  Anemia of chronic disease  Tobacco abuse     Sickle cell pain crisis ([place]) - will admit per sickle cell protocol,    control pain,    hydrate with IVF D5 .45% Saline @ 100 mls/hour,    Weight based Dilaudid PCA for opioid tolerant patients.    Continue  folic acid   Transfuse as needed if Hg drops significantly below baseline.    No evidence of acute chest at this time   Sickle cell team to take over management in AM   Anemia of chronic disease Chronic stable  Tobacco abuse  - Spoke about importance of quitting spent 5 minutes discussing options for treatment, prior attempts at quitting, and dangers of smoking  -At this point patient is  NOT  interested in quitting  - order nicotine patch   - nursing tobacco cessation protocol    Other plan as per orders.  DVT prophylaxis:  Lovenox      Code Status:    Code Status: Prior FULL CODE   as per patient  family  I had  personally discussed CODE STATUS with patient     Family Communication:   Family not at  Bedside    Disposition Plan:       To home once workup is complete and patient is stable   Following barriers for discharge:                                                         Pain controlled with PO medications                             discharge                       Consults called:    Admission status:  ED Disposition     ED Disposition  Admit   Condition  --   Comment  The patient appears reasonably stabilized for admission considering the current resources, flow, and capabilities available in the ED at this time, and I doubt any other [facility] requiring further screening and/or treatment in the ED prior to admission is  present.           inpatient     I Expect 2 midnight stay secondary to severity of patient's current illness need for inpatient interventions justified by the following:    Severe lab/radiological/exam abnormalities including:    Sickle cell  and extensive comorbidities including:    sickle cell disease     That are currently affecting medical management.   I expect  patient to be hospitalized for 2 midnights requiring inpatient medical care.  Patient is at high risk for adverse outcome (such as loss of life or disability) if not treated.  Indication for inpatient stay as follows:    severe pain requiring acute inpatient management,      Need for  IV fluids, I  IV pain medications,      Level of care     medical floor        Lab Results  Component Value Date  [place] NEGATIVE 12/31/2021     Linnea Todisco 04/07/2022, 8:42 PM    [facility]     after 2 AM please page floor coverage PA If 7AM-7PM, please contact the day team taking care of the patient using [email or web address]   Patient was evaluated in the context of the global COVID-19 pandemic, which necessitated consideration that the patient might be at risk for infection with  the SARS-CoV-2 virus that causes COVID-19. Institutional protocols and algorithms that pertain to the evaluation of patients at risk for COVID-19 are in a state of rapid change based on information released by regulatory bodies including the CDC and federal and state organizations. These policies and algorithms were followed during the patient's care.

## 2022-04-07 NOTE — Assessment & Plan Note (Signed)
Chronic-stable.

## 2022-04-07 NOTE — Assessment & Plan Note (Signed)
-   will admit per sickle cell protocol,    control pain,    hydrate with IVF D5 .45% Saline @ 100 mls/hour,    Weight based Dilaudid PCA for opioid tolerant patients.    Continue  folic acid   Transfuse as needed if Hg drops significantly below baseline.    No evidence of acute chest at this time   Sickle cell team to take over management in AM

## 2022-04-07 NOTE — Progress Notes (Signed)
Reviewed PDMP substance reporting system prior to prescribing opiate medications. No inconsistencies noted.  Meds ordered this encounter  Medications   morphine (MS CONTIN) 60 MG 12 hr tablet    Sig: Take 1 tablet (60 mg total) by mouth every 12 (twelve) hours.    Dispense:  60 tablet    Refill:  0    Order Specific Question:   Supervising Provider    Answer:   [name], [name] [[id number]]     Carmen [name] Carmen Izetta Sakamoto  APRN, MSN, FNP-C [facility] [address]  [place], [place] [address] [phone]  

## 2022-04-08 ENCOUNTER — Other Ambulatory Visit: Payer: Self-pay

## 2022-04-08 DIAGNOSIS — D5700 Hb-SS disease with crisis, unspecified: Secondary | ICD-10-CM

## 2022-04-08 LAB — RETICULOCYTES
Immature Retic Fract: 37.5 % — ABNORMAL HIGH (ref 2.3–15.9)
RBC.: 3.3 MIL/uL — ABNORMAL LOW (ref 3.87–5.11)
Retic Count, Absolute: 184.8 10*3/uL (ref 19.0–186.0)
Retic Ct Pct: 5.6 % — ABNORMAL HIGH (ref 0.4–3.1)

## 2022-04-08 LAB — COMPREHENSIVE METABOLIC PANEL
ALT: 38 U/L (ref 0–44)
AST: 37 U/L (ref 15–41)
Albumin: 3.8 g/dL (ref 3.5–5.0)
Alkaline Phosphatase: 53 U/L (ref 38–126)
Anion gap: 8 (ref 5–15)
BUN: 9 mg/dL (ref 6–20)
CO2: 22 mmol/L (ref 22–32)
Calcium: 9.2 mg/dL (ref 8.9–10.3)
Chloride: 108 mmol/L (ref 98–111)
Creatinine, Ser: 0.51 mg/dL (ref 0.44–1.00)
GFR, Estimated: >60 mL/min (ref >60)
Glucose, Bld: 95 mg/dL (ref 70–99)
Potassium: 4.1 mmol/L (ref 3.5–5.1)
Sodium: 138 mmol/L (ref 135–145)
Total Bilirubin: 1.4 mg/dL — ABNORMAL HIGH (ref 0.3–1.2)
Total Protein: 7.6 g/dL (ref 6.5–8.1)

## 2022-04-08 LAB — CBC WITH DIFFERENTIAL/PLATELET
Abs Immature Granulocytes: 0.03 10*3/uL (ref 0.00–0.07)
Basophils Absolute: 0 10*3/uL (ref 0.0–0.1)
Basophils Relative: 1 %
Eosinophils Absolute: 0.3 10*3/uL (ref 0.0–0.5)
Eosinophils Relative: 4 %
HCT: 29.6 % — ABNORMAL LOW (ref 36.0–46.0)
Hemoglobin: 9.9 g/dL — ABNORMAL LOW (ref 12.0–15.0)
Immature Granulocytes: 0 %
Lymphocytes Relative: 37 %
Lymphs Abs: 2.6 10*3/uL (ref 0.7–4.0)
MCH: 29.8 pg (ref 26.0–34.0)
MCHC: 33.4 g/dL (ref 30.0–36.0)
MCV: 89.2 fL (ref 80.0–100.0)
Monocytes Absolute: 0.9 10*3/uL (ref 0.1–1.0)
Monocytes Relative: 12 %
Neutro Abs: 3.3 10*3/uL (ref 1.7–7.7)
Neutrophils Relative %: 46 %
Platelets: 406 10*3/uL — ABNORMAL HIGH (ref 150–400)
RBC: 3.32 MIL/uL — ABNORMAL LOW (ref 3.87–5.11)
RDW: 15.5 % (ref 11.5–15.5)
WBC: 7.2 10*3/uL (ref 4.0–10.5)
nRBC: 0.6 % — ABNORMAL HIGH (ref 0.0–0.2)

## 2022-04-08 MED ORDER — FOLIC ACID 1 MG PO TABS
1.0000 mg | ORAL_TABLET | Freq: Every day | ORAL | Status: DC
Start: 2022-04-08 — End: 2022-04-12
  Administered 2022-04-08 – 2022-04-12 (×5): 1 mg via ORAL
  Filled 2022-04-08 (×5): qty 1

## 2022-04-08 MED ORDER — HYDROXYZINE HCL 25 MG PO TABS: 25.0000 mg | ORAL_TABLET | ORAL | Status: DC | PRN

## 2022-04-08 MED ORDER — ONDANSETRON HCL 4 MG/2ML IJ SOLN
4.0000 mg | Freq: Once | INTRAMUSCULAR | Status: AC
  Administered 2022-04-08: 4 mg via INTRAVENOUS

## 2022-04-08 MED ORDER — DEXTROSE-NACL 5-0.45 % IV SOLN: INTRAVENOUS | Status: AC

## 2022-04-08 MED ORDER — HYDROMORPHONE 1 MG/ML IV SOLN
INTRAVENOUS | Status: DC
  Administered 2022-04-08: 30 mg via INTRAVENOUS
  Administered 2022-04-08: 3 mg via INTRAVENOUS
  Administered 2022-04-08: 30 mg via INTRAVENOUS
  Administered 2022-04-09: 4.8 mg via INTRAVENOUS
  Administered 2022-04-09: 8.4 mg via INTRAVENOUS
  Administered 2022-04-09: 6.6 mg via INTRAVENOUS
  Administered 2022-04-09: 1 mg via INTRAVENOUS
  Administered 2022-04-09: 6.6 mg via INTRAVENOUS
  Administered 2022-04-09: 2.4 mg via INTRAVENOUS
  Administered 2022-04-09: 4.8 mg via INTRAVENOUS
  Administered 2022-04-10: 6.6 mg via INTRAVENOUS
  Administered 2022-04-10: 7.8 mg via INTRAVENOUS
  Administered 2022-04-10: 6 mg via INTRAVENOUS
  Administered 2022-04-10: 30 mg via INTRAVENOUS
  Administered 2022-04-10: 9.6 mg via INTRAVENOUS
  Administered 2022-04-10: 6 mg via INTRAVENOUS
  Administered 2022-04-11: 13.8 mg via INTRAVENOUS
  Administered 2022-04-11: 16.2 mg via INTRAVENOUS
  Administered 2022-04-11: 6.6 mg via INTRAVENOUS
  Administered 2022-04-11: 7.5 mg via INTRAVENOUS
  Administered 2022-04-11: 0.6 mg via INTRAVENOUS
  Administered 2022-04-11: 5.4 mg via INTRAVENOUS
  Administered 2022-04-12: 2.4 mg via INTRAVENOUS
  Administered 2022-04-12: 1.8 mg via INTRAVENOUS
  Filled 2022-04-08 (×6): qty 30

## 2022-04-08 MED ORDER — HYDROXYZINE HCL 25 MG PO TABS
25.0000 mg | ORAL_TABLET | ORAL | Status: DC | PRN
  Administered 2022-04-11 – 2022-04-12 (×2): 25 mg via ORAL
  Filled 2022-04-08 (×2): qty 1

## 2022-04-08 MED ORDER — ONDANSETRON HCL 4 MG PO TABS
4.0000 mg | ORAL_TABLET | ORAL | Status: DC | PRN
  Administered 2022-04-10: 4 mg via ORAL
  Filled 2022-04-08: qty 1

## 2022-04-08 MED ORDER — KETOROLAC TROMETHAMINE 15 MG/ML IJ SOLN
15.0000 mg | Freq: Four times a day (QID) | INTRAMUSCULAR | Status: DC
  Administered 2022-04-08 – 2022-04-12 (×17): 15 mg via INTRAVENOUS
  Filled 2022-04-08 (×17): qty 1

## 2022-04-08 MED ORDER — ONDANSETRON HCL 4 MG/2ML IJ SOLN
4.0000 mg | INTRAMUSCULAR | Status: DC | PRN
  Administered 2022-04-08 – 2022-04-12 (×5): 4 mg via INTRAVENOUS
  Filled 2022-04-08 (×6): qty 2

## 2022-04-08 MED ORDER — ENOXAPARIN SODIUM 40 MG/0.4ML IJ SOSY
40.0000 mg | PREFILLED_SYRINGE | Freq: Every day | INTRAMUSCULAR | Status: DC
  Filled 2022-04-08 (×3): qty 0.4

## 2022-04-08 MED ORDER — SENNOSIDES-DOCUSATE SODIUM 8.6-50 MG PO TABS
1.0000 | ORAL_TABLET | Freq: Two times a day (BID) | ORAL | Status: DC
Start: 2022-04-08 — End: 2022-04-12
  Administered 2022-04-08 – 2022-04-12 (×7): 1 via ORAL
  Filled 2022-04-08 (×8): qty 1

## 2022-04-08 MED ORDER — OXYCODONE HCL 5 MG PO TABS
10.0000 mg | ORAL_TABLET | Freq: Four times a day (QID) | ORAL | Status: DC | PRN
  Administered 2022-04-08 – 2022-04-12 (×8): 10 mg via ORAL
  Filled 2022-04-08 (×8): qty 2

## 2022-04-08 MED ORDER — POLYETHYLENE GLYCOL 3350 17 G PO PACK
17.0000 g | PACK | Freq: Every day | ORAL | Status: DC | PRN
Start: 2022-04-08 — End: 2022-04-12

## 2022-04-08 NOTE — Progress Notes (Signed)
Subjective: Carmen [name] is a 31 year old female with a medical history significant for sickle cell disease, chronic pain syndrome, opiate dependence and tolerance, and history of anemia of chronic disease that was admitted and sickle cell pain crisis. Patient has had frequent inpatient admissions over the past 6 months.  She attributes current pain crisis to recent rainy weather.  Pain intensity is 10/10 and primarily to her low back and lower extremities.  She characterizes her pain as constant and throbbing. She denies headache, chest pain, urinary symptoms, nausea, vomiting, or diarrhea.  Objective:  Vital signs in last 24 hours:  Vitals:   04/08/22 0930 04/08/22 1014 04/08/22 1207 04/08/22 1514  BP:  112/82    Pulse:  73    Resp: '12 16 12 16  '$ Temp:  98.1 F (36.7 C)    TempSrc:  Oral    SpO2: 100% 97% 99%   Weight:      Height:        Intake/Output from previous day:   Intake/Output Summary (Last 24 hours) at 04/08/2022 1524 Last data filed at 04/08/2022 1400 Gross per 24 hour  Intake 790.12 ml  Output --  Net 790.12 ml    Physical Exam: General: Alert, awake, oriented x3, in no acute distress.  HEENT: [place]/AT PEERL, EOMI Neck: Trachea midline,  no masses, no thyromegal,y no JVD, no carotid bruit OROPHARYNX:  Moist, No exudate/ erythema/lesions.  Heart: Regular rate and rhythm, without murmurs, rubs, gallops, PMI non-displaced, no heaves or thrills on palpation.  Lungs: Clear to auscultation, no wheezing or rhonchi noted. No increased vocal fremitus resonant to percussion  Abdomen: Soft, nontender, nondistended, positive bowel sounds, no masses no hepatosplenomegaly noted..  Neuro: No focal neurological deficits noted cranial nerves II through XII grossly intact. DTRs 2+ bilaterally upper and lower extremities. Strength 5 out of 5 in bilateral upper and lower extremities. Musculoskeletal: No warm swelling or erythema around joints, no spinal tenderness noted. Psychiatric:  Patient alert and oriented x3, good insight and cognition, good recent to remote recall. Lymph node survey: No cervical axillary or inguinal lymphadenopathy noted.  Lab Results:  Basic Metabolic Panel:    Component Value Date/Time   NA 138 04/08/2022 0821   NA 142 07/02/2020 1040   K 4.1 04/08/2022 0821   CL 108 04/08/2022 0821   CO2 22 04/08/2022 0821   BUN 9 04/08/2022 0821   BUN 8 07/02/2020 1040   CREATININE 0.51 04/08/2022 0821   GLUCOSE 95 04/08/2022 0821   CALCIUM 9.2 04/08/2022 0821   CBC:    Component Value Date/Time   WBC 7.2 04/08/2022 0821   HGB 9.9 (L) 04/08/2022 0821   HGB 9.8 (L) 07/02/2020 1040   HCT 29.6 (L) 04/08/2022 0821   HCT 28.9 (L) 07/02/2020 1040   PLT 406 (H) 04/08/2022 0821   PLT 628 (H) 07/02/2020 1040   MCV 89.2 04/08/2022 0821   MCV 88 07/02/2020 1040   NEUTROABS 3.3 04/08/2022 0821   NEUTROABS 3.0 07/02/2020 1040   LYMPHSABS 2.6 04/08/2022 0821   LYMPHSABS 3.5 (H) 07/02/2020 1040   MONOABS 0.9 04/08/2022 0821   EOSABS 0.3 04/08/2022 0821   EOSABS 0.2 07/02/2020 1040   BASOSABS 0.0 04/08/2022 0821   BASOSABS 0.1 07/02/2020 1040    Recent Results (from the past 240 hour(s))  SARS Coronavirus 2 by RT PCR (hospital order, performed in [facility] hospital lab) *cepheid single result test* Anterior Nasal Swab     Status: None   Collection Time: 04/07/22  7:09 PM   Specimen: Anterior Nasal Swab  Result Value Ref Range Status   SARS Coronavirus 2 by RT PCR NEGATIVE NEGATIVE Final    Comment: (NOTE) SARS-CoV-2 target nucleic acids are NOT DETECTED.  The SARS-CoV-2 RNA is generally detectable in upper and lower respiratory specimens during the acute phase of infection. The lowest concentration of SARS-CoV-2 viral copies this assay can detect is 250 copies / mL. A negative result does not preclude SARS-CoV-2 infection and should not be used as the sole basis for treatment or other patient management decisions.  A negative result may occur  with improper specimen collection / handling, submission of specimen other than nasopharyngeal swab, presence of viral mutation(s) within the areas targeted by this assay, and inadequate number of viral copies (<250 copies / mL). A negative result must be combined with clinical observations, patient history, and epidemiological information.  Fact Sheet for Patients:   https://www.patel.info/  Fact Sheet for Healthcare Providers: https://hall.com/  This test is not yet approved or  cleared by the [place] FDA and has been authorized for detection and/or diagnosis of SARS-CoV-2 by FDA under an Emergency Use Authorization (EUA).  This EUA will remain in effect (meaning this test can be used) for the duration of the COVID-19 declaration under Section 564(b)(1) of the Act, 21 U.S.C. section 360bbb-3(b)(1), unless the authorization is terminated or revoked sooner.  Performed at [facility], [place] [address], [place], [place] [address]     Studies/Results: DG Hips Bilat W or Wo Pelvis 3-4 Views  Result Date: 04/07/2022 CLINICAL DATA:  Sickle cell with hip pain, initial encounter EXAM: DG HIP (WITH OR WITHOUT PELVIS) 4V BILAT COMPARISON:  None Available. FINDINGS: Pelvic ring is intact. No acute fracture or dislocation is noted. No findings to suggest avascular necrosis are noted at this time. No soft tissue abnormality is noted. IMPRESSION: No acute abnormality noted. Electronically Signed   By: Inez Catalina M.D.   On: 04/07/2022 19:28    Medications: Scheduled Meds:  enoxaparin (LOVENOX) injection  40 mg Subcutaneous Daily   folic acid  1 mg Oral Daily   HYDROmorphone   Intravenous Q4H   ketorolac  15 mg Intravenous Q6H   nicotine  14 mg Transdermal Daily   senna-docusate  1 tablet Oral BID   Continuous Infusions:  dextrose 5 % and 0.45% NaCl 100 mL/hr at 04/08/22 1053   PRN Meds:.diphenhydrAMINE, hydrOXYzine,  naloxone **AND** sodium chloride flush, ondansetron **OR** ondansetron (ZOFRAN) IV, oxyCODONE, polyethylene glycol  Consultants: None  Procedures: None  Antibiotics: None  Assessment/Plan: Active Problems:   Anemia of chronic disease   Sickle cell pain crisis ([place])   Tobacco abuse  Sickle cell disease with pain crisis: Decrease IV fluids to [facility] Patient is complaining of severe pain primarily to low back and lower extremities.  Increased PCA settings. Toradol 15 mg IV every 6 hours Add patient's home oxycodone 10 mg every 6 hours as needed for severe breakthrough pain Monitor vital signs very closely, reevaluate pain scale regularly, and supplemental oxygen as needed  History of anemia of chronic disease: Patient's hemoglobin is stable and consistent with her baseline.  There is no clinical indication for blood transfusion at this time.  Continue to follow closely.  Major depression: Continue home medications.  No suicidal or homicidal ideations today.  Monitor closely.  Chronic pain syndrome: Continue home medications Code Status: Full Code Family Communication: N/A Disposition Plan: Not yet ready for discharge [place]  APRN,  MSN, FNP-C [facility] [place], [place] [address] [phone]  If 7PM-7AM, please contact night-coverage.  04/08/2022, 3:24 PM  LOS: 1 day

## 2022-04-08 NOTE — Plan of Care (Signed)
Problem: Education: Goal: Knowledge of vaso-occlusive preventative measures will improve Outcome: Progressing   Problem: Respiratory: Goal: Pulmonary complications will be avoided or minimized Outcome: Progressing   Problem: Clinical Measurements: Goal: Ability to maintain clinical measurements within normal limits will improve Outcome: [place], RN 04/08/22 3:28 PM

## 2022-04-08 NOTE — TOC Progression Note (Signed)
Transition of Care [facility]) - Progression Note    Patient Details  Name: Carmen [name] MRN: [id number] Date of Birth: [birth year]  Transition of Care [facility]) CM/SW Contact  Lennart Pall, LCSW Phone Number: 04/08/2022, 9:36 AM  Clinical Narrative:      Transition of Care (TOC) Screening Note   Patient Details  Name: Carmen [name] Date of Birth: Sep 19, 1991   Transition of Care [facility]) CM/SW Contact:    Lennart Pall, LCSW Phone Number: 04/08/2022, 9:36 AM    Transition of Care Department [facility]) has reviewed patient and no TOC needs have been identified at this time. We will continue to monitor patient advancement through interdisciplinary progression rounds. If new patient transition needs arise, please place a TOC consult.         Expected Discharge Plan and Services                                                 Social Determinants of Health (SDOH) Interventions    Readmission Risk Interventions    04/08/2022    9:35 AM 03/04/2022    2:02 PM 02/03/2022    9:08 AM  Readmission Risk Prevention Plan  Transportation Screening Complete Complete Complete  Medication Review [employer])  Complete Complete  PCP or Specialist appointment within 3-5 days of discharge Complete Complete Complete  HRI or Home Care Consult Complete Complete Not Complete  SW Recovery Care/Counseling Consult Complete Complete Not Complete  Palliative Care Screening Not Applicable Not Applicable Not [place] Not Applicable Not Applicable Not Applicable

## 2022-04-09 ENCOUNTER — Inpatient Hospital Stay (HOSPITAL_COMMUNITY): Payer: Medicaid Other

## 2022-04-09 DIAGNOSIS — D5700 Hb-SS disease with crisis, unspecified: Secondary | ICD-10-CM | POA: Diagnosis not present

## 2022-04-09 MED ORDER — DULOXETINE HCL 60 MG PO CPEP
60.0000 mg | ORAL_CAPSULE | Freq: Every day | ORAL | Status: DC
  Administered 2022-04-09 – 2022-04-12 (×4): 60 mg via ORAL
  Filled 2022-04-09 (×4): qty 1

## 2022-04-09 MED ORDER — CITALOPRAM HYDROBROMIDE 20 MG PO TABS
20.0000 mg | ORAL_TABLET | Freq: Every morning | ORAL | Status: DC
  Administered 2022-04-09 – 2022-04-12 (×4): 20 mg via ORAL
  Filled 2022-04-09 (×4): qty 1

## 2022-04-09 MED ORDER — MORPHINE SULFATE ER 15 MG PO TBCR
60.0000 mg | EXTENDED_RELEASE_TABLET | Freq: Two times a day (BID) | ORAL | Status: DC
  Administered 2022-04-09 – 2022-04-12 (×7): 60 mg via ORAL
  Filled 2022-04-09 (×7): qty 4

## 2022-04-09 MED ORDER — CYCLOBENZAPRINE HCL 10 MG PO TABS
10.0000 mg | ORAL_TABLET | Freq: Every day | ORAL | Status: DC | PRN
  Administered 2022-04-11: 10 mg via ORAL
  Filled 2022-04-09 (×2): qty 1

## 2022-04-09 MED ORDER — FOLIC ACID 1 MG PO TABS: 1.0000 mg | ORAL_TABLET | Freq: Every day | ORAL | Status: DC

## 2022-04-09 NOTE — Progress Notes (Signed)
Subjective: Carmen [name] is a 31 year old female with a medical history significant for sickle cell disease, chronic pain syndrome, opiate dependence and tolerance, and history of anemia of chronic disease that was admitted and sickle cell pain crisis. Patient continues to complain of low back and lower extremity pain.  She rates her pain as 7/10.  Blood cocaine She denies headache, chest pain, urinary symptoms, nausea, vomiting, or diarrhea.   Objective:  Vital signs in last 24 hours:  Vitals:   04/09/22 0723 04/09/22 1034 04/09/22 1152 04/09/22 1434  BP:  (!) 96/50  114/68  Pulse:  71  75  Resp: '18 15 16 14  '$ Temp:  97.9 F (36.6 C)  98.2 F (36.8 C)  TempSrc:  Oral  Oral  SpO2: 99% 99% 99% 96%  Weight:      Height:        Intake/Output from previous day:   Intake/Output Summary (Last 24 hours) at 04/09/2022 1453 Last data filed at 04/09/2022 1034 Gross per 24 hour  Intake 918.18 ml  Output --  Net 918.18 ml    Physical Exam: General: Alert, awake, oriented x3, in no acute distress.  HEENT: [place]/AT [place]ERL, EOMI Neck: Trachea midline,  no masses, no thyromegal,y no JVD, no carotid bruit OROPHARYNX:  Moist, No exudate/ erythema/lesions.  Heart: Regular rate and rhythm, without murmurs, rubs, gallops, PMI non-displaced, no heaves or thrills on palpation.  Lungs: Clear to auscultation, no wheezing or rhonchi noted. No increased vocal fremitus resonant to percussion  Abdomen: Soft, nontender, nondistended, positive bowel sounds, no masses no hepatosplenomegaly noted..  Neuro: No focal neurological deficits noted cranial nerves II through XII grossly intact. DTRs 2+ bilaterally upper and lower extremities. Strength 5 out of 5 in bilateral upper and lower extremities. Musculoskeletal: No warm swelling or erythema around joints, no spinal tenderness noted. Psychiatric: Patient alert and oriented x3, good insight and cognition, good recent to remote recall. Lymph node survey: No  cervical axillary or inguinal lymphadenopathy noted.  Lab Results:  Basic Metabolic Panel:    Component Value Date/Time   NA 138 04/08/2022 0821   NA 142 07/02/2020 1040   K 4.1 04/08/2022 0821   CL 108 04/08/2022 0821   CO2 22 04/08/2022 0821   BUN 9 04/08/2022 0821   BUN 8 07/02/2020 1040   CREATININE 0.51 04/08/2022 0821   GLUCOSE 95 04/08/2022 0821   CALCIUM 9.2 04/08/2022 0821   CBC:    Component Value Date/Time   WBC 7.2 04/08/2022 0821   HGB 9.9 (L) 04/08/2022 0821   HGB 9.8 (L) 07/02/2020 1040   HCT 29.6 (L) 04/08/2022 0821   HCT 28.9 (L) 07/02/2020 1040   PLT 406 (H) 04/08/2022 0821   PLT 628 (H) 07/02/2020 1040   MCV 89.2 04/08/2022 0821   MCV 88 07/02/2020 1040   NEUTROABS 3.3 04/08/2022 0821   NEUTROABS 3.0 07/02/2020 1040   LYMPHSABS 2.6 04/08/2022 0821   LYMPHSABS 3.5 (H) 07/02/2020 1040   MONOABS 0.9 04/08/2022 0821   EOSABS 0.3 04/08/2022 0821   EOSABS 0.2 07/02/2020 1040   BASOSABS 0.0 04/08/2022 0821   BASOSABS 0.1 07/02/2020 1040    Recent Results (from the past 240 hour(s))  SARS Coronavirus 2 by RT PCR (hospital order, performed in [facility] hospital lab) *cepheid single result test* Anterior Nasal Swab     Status: None   Collection Time: 04/07/22  7:09 PM   Specimen: Anterior Nasal Swab  Result Value Ref Range Status   SARS Coronavirus  2 by RT PCR NEGATIVE NEGATIVE Final    Comment: (NOTE) SARS-CoV-2 target nucleic acids are NOT DETECTED.  The SARS-CoV-2 RNA is generally detectable in upper and lower respiratory specimens during the acute phase of infection. The lowest concentration of SARS-CoV-2 viral copies this assay can detect is 250 copies / mL. A negative result does not preclude SARS-CoV-2 infection and should not be used as the sole basis for treatment or other patient management decisions.  A negative result may occur with improper specimen collection / handling, submission of specimen other than nasopharyngeal swab, presence  of viral mutation(s) within the areas targeted by this assay, and inadequate number of viral copies (<250 copies / mL). A negative result must be combined with clinical observations, patient history, and epidemiological information.  Fact Sheet for Patients:   https://www.patel.info/  Fact Sheet for Healthcare Providers: https://hall.com/  This test is not yet approved or  cleared by the [place] FDA and has been authorized for detection and/or diagnosis of SARS-CoV-2 by FDA under an Emergency Use Authorization (EUA).  This EUA will remain in effect (meaning this test can be used) for the duration of the COVID-19 declaration under Section 564(b)(1) of the Act, 21 U.S.C. section 360bbb-3(b)(1), unless the authorization is terminated or revoked sooner.  Performed at [facility], [place] [address] [place], [place] [address]     Studies/Results: DG Hips Bilat W or Wo Pelvis 3-4 Views  Result Date: 04/07/2022 CLINICAL DATA:  Sickle cell with hip pain, initial encounter EXAM: DG HIP (WITH OR WITHOUT PELVIS) 4V BILAT COMPARISON:  None Available. FINDINGS: Pelvic ring is intact. No acute fracture or dislocation is noted. No findings to suggest avascular necrosis are noted at this time. No soft tissue abnormality is noted. IMPRESSION: No acute abnormality noted. Electronically Signed   By: Inez Catalina M.D.   On: 04/07/2022 19:28    Medications: Scheduled Meds:  citalopram  20 mg Oral q morning   DULoxetine  60 mg Oral Daily   enoxaparin (LOVENOX) injection  40 mg Subcutaneous Daily   folic acid  1 mg Oral Daily   HYDROmorphone   Intravenous Q4H   ketorolac  15 mg Intravenous Q6H   morphine  60 mg Oral Q12H   nicotine  14 mg Transdermal Daily   senna-docusate  1 tablet Oral BID   Continuous Infusions:   PRN Meds:.cyclobenzaprine, diphenhydrAMINE, hydrOXYzine, naloxone **AND** sodium chloride flush, ondansetron **OR**  ondansetron (ZOFRAN) IV, oxyCODONE, polyethylene glycol  Consultants: None  Procedures: None  Antibiotics: None  Assessment/Plan: Principal Problem:   Sickle cell pain crisis ([place]) Active Problems:   Anemia of chronic disease   Tobacco abuse  Sickle cell disease with pain crisis: Decrease IV fluids to [facility] Patient is complaining of severe pain primarily to low back and lower extremities.  Increased PCA settings. Toradol 15 mg IV every 6 hours Add patient's home oxycodone 10 mg every 6 hours as needed for severe breakthrough pain Monitor vital signs very closely, reevaluate pain scale regularly, and supplemental oxygen as needed  History of anemia of chronic disease: Patient's hemoglobin is stable and consistent with her baseline.  There is no clinical indication for blood transfusion at this time.  Continue to follow closely.  Major depression: Continue home medications.  No suicidal or homicidal ideations today.  Monitor closely.  Chronic pain syndrome: Continue home medications Code Status: Full Code Family Communication: N/A Disposition Plan: Not yet ready for discharge Brayten Komar Al Decant  APRN, MSN, FNP-C  [facility] [place], [place] [id number] [phone]  If 7PM-7AM, please contact night-coverage.  04/09/2022, 2:53 PM  LOS: 2 days

## 2022-04-10 NOTE — Progress Notes (Signed)
Patient ID: Carmen [name], female   DOB: 24-May-1991, 31 y.o.   MRN: [id number] Subjective: Carmen [name] is a 31 year old female with a medical history significant for sickle cell disease, chronic pain syndrome, opiate dependence and tolerance, and history of anemia of chronic disease that was admitted and sickle cell pain crisis.  Patient still continues to have significant pain in her lower back and lower extremities especially on the right.  Her pain is at between 6 and 7/10 today.  She denies any fever, cough, chest pain, shortness of breath, nausea, vomiting or diarrhea.  No urinary symptoms.  Objective:  Vital signs in last 24 hours:  Vitals:   04/10/22 0605 04/10/22 1012 04/10/22 1250 04/10/22 1345  BP: 132/67 119/71  120/64  Pulse: 68 (!) 107  84  Resp: '14 16 16 16  '$ Temp: 97.6 F (36.4 C) 98.6 F (37 C)  98.3 F (36.8 C)  TempSrc: Oral Oral  Oral  SpO2: 95% 100% 100% 96%  Weight:      Height:        Intake/Output from previous day:   Intake/Output Summary (Last 24 hours) at 04/10/2022 1403 Last data filed at 04/10/2022 0853 Gross per 24 hour  Intake 240 ml  Output --  Net 240 ml    Physical Exam: General: Alert, awake, oriented x3, in no acute distress.  HEENT: [place]/AT [place]ERL, EOMI Neck: Trachea midline,  no masses, no thyromegal,y no JVD, no carotid bruit OROPHARYNX:  Moist, No exudate/ erythema/lesions.  Heart: Regular rate and rhythm, without murmurs, rubs, gallops, PMI non-displaced, no heaves or thrills on palpation.  Lungs: Clear to auscultation, no wheezing or rhonchi noted. No increased vocal fremitus resonant to percussion  Abdomen: Soft, nontender, nondistended, positive bowel sounds, no masses no hepatosplenomegaly noted..  Neuro: No focal neurological deficits noted cranial nerves II through XII grossly intact. DTRs 2+ bilaterally upper and lower extremities. Strength 5 out of 5 in bilateral upper and lower extremities. Musculoskeletal: No warm swelling or  erythema around joints, no spinal tenderness noted. Psychiatric: Patient alert and oriented x3, good insight and cognition, good recent to remote recall. Lymph node survey: No cervical axillary or inguinal lymphadenopathy noted.  Lab Results:  Basic Metabolic Panel:    Component Value Date/Time   NA 138 04/08/2022 0821   NA 142 07/02/2020 1040   K 4.1 04/08/2022 0821   CL 108 04/08/2022 0821   CO2 22 04/08/2022 0821   BUN 9 04/08/2022 0821   BUN 8 07/02/2020 1040   CREATININE 0.51 04/08/2022 0821   GLUCOSE 95 04/08/2022 0821   CALCIUM 9.2 04/08/2022 0821   CBC:    Component Value Date/Time   WBC 7.2 04/08/2022 0821   HGB 9.9 (L) 04/08/2022 0821   HGB 9.8 (L) 07/02/2020 1040   HCT 29.6 (L) 04/08/2022 0821   HCT 28.9 (L) 07/02/2020 1040   PLT 406 (H) 04/08/2022 0821   PLT 628 (H) 07/02/2020 1040   MCV 89.2 04/08/2022 0821   MCV 88 07/02/2020 1040   NEUTROABS 3.3 04/08/2022 0821   NEUTROABS 3.0 07/02/2020 1040   LYMPHSABS 2.6 04/08/2022 0821   LYMPHSABS 3.5 (H) 07/02/2020 1040   MONOABS 0.9 04/08/2022 0821   EOSABS 0.3 04/08/2022 0821   EOSABS 0.2 07/02/2020 1040   BASOSABS 0.0 04/08/2022 0821   BASOSABS 0.1 07/02/2020 1040    Recent Results (from the past 240 hour(s))  SARS Coronavirus 2 by RT PCR (hospital order, performed in [facility] hospital lab) *cepheid single result  test* Anterior Nasal Swab     Status: None   Collection Time: 04/07/22  7:09 PM   Specimen: Anterior Nasal Swab  Result Value Ref Range Status   SARS Coronavirus 2 by RT PCR NEGATIVE NEGATIVE Final    Comment: (NOTE) SARS-CoV-2 target nucleic acids are NOT DETECTED.  The SARS-CoV-2 RNA is generally detectable in upper and lower respiratory specimens during the acute phase of infection. The lowest concentration of SARS-CoV-2 viral copies this assay can detect is 250 copies / mL. A negative result does not preclude SARS-CoV-2 infection and should not be used as the sole basis for treatment or  other patient management decisions.  A negative result may occur with improper specimen collection / handling, submission of specimen other than nasopharyngeal swab, presence of viral mutation(s) within the areas targeted by this assay, and inadequate number of viral copies (<250 copies / mL). A negative result must be combined with clinical observations, patient history, and epidemiological information.  Fact Sheet for Patients:   https://www.patel.info/  Fact Sheet for Healthcare Providers: https://hall.com/  This test is not yet approved or  cleared by the [place] FDA and has been authorized for detection and/or diagnosis of SARS-CoV-2 by FDA under an Emergency Use Authorization (EUA).  This EUA will remain in effect (meaning this test can be used) for the duration of the COVID-19 declaration under Section 564(b)(1) of the Act, 21 U.S.C. section 360bbb-3(b)(1), unless the authorization is terminated or revoked sooner.  Performed at [facility], [place] [address] [place], [place] [address]     Studies/Results: DG Lumbar Spine Complete  Result Date: 04/09/2022 CLINICAL DATA:  Low back pain, pain right lower extremity EXAM: LUMBAR SPINE - COMPLETE 4+ VIEW COMPARISON:  11/11/2011 FINDINGS: No recent fracture is seen. Alignment of posterior margins of vertebral bodies is unremarkable. There is no disc space narrowing. Paraspinal soft tissues are unremarkable. Surgical clips are seen in the right upper quadrant of abdomen. IMPRESSION: No significant radiographic abnormality is seen in the lumbar spine. Electronically Signed   By: Elmer Picker M.D.   On: 04/09/2022 16:01    Medications: Scheduled Meds:  citalopram  20 mg Oral q morning   DULoxetine  60 mg Oral Daily   enoxaparin (LOVENOX) injection  40 mg Subcutaneous Daily   folic acid  1 mg Oral Daily   HYDROmorphone   Intravenous Q4H   ketorolac  15 mg  Intravenous Q6H   morphine  60 mg Oral Q12H   nicotine  14 mg Transdermal Daily   senna-docusate  1 tablet Oral BID   Continuous Infusions: PRN Meds:.cyclobenzaprine, diphenhydrAMINE, hydrOXYzine, naloxone **AND** sodium chloride flush, ondansetron **OR** ondansetron (ZOFRAN) IV, oxyCODONE, polyethylene glycol  Consultants: None  Procedures: None  Antibiotics: None  Assessment/Plan: Principal Problem:   Sickle cell pain crisis ([place]) Active Problems:   Anemia of chronic disease   Tobacco abuse  Hb Sickle Cell Disease with Pain crisis: Continue IVF at [facility], continue weight based Dilaudid PCA at current dose setting, continue IV Toradol 15 mg Q 6 H for total of 5 days, continue oral pain medications as ordered for breakthrough pain.  Monitor vitals very closely, Re-evaluate pain scale regularly, 2 L of Oxygen by [place]. Anemia of Chronic Disease: Hemoglobin remains stable at baseline today.  There is no clinical indication for blood transfusion at this time.  We will continue to monitor closely and transfuse as appropriate. Chronic pain Syndrome: Continue oral home pain medications. Major depression: Stable clinically.  Patient denies any suicidal ideations or thoughts.  We will continue her home medications. Tobacco abuse:Carmen [name] was counseled on the dangers of tobacco use, and was advised to quit. Reviewed strategies to maximize success, including removing cigarettes and smoking materials from environment, stress management and support of family/friends.   Code Status: Full Code Family Communication: N/A Disposition Plan: Not yet ready for discharge  Carmen [name]  If 7PM-7AM, please contact night-coverage.  04/10/2022, 2:03 PM  LOS: 3 days

## 2022-04-10 NOTE — Plan of Care (Signed)
  Problem: Respiratory: Goal: Pulmonary complications will be avoided or minimized Outcome: Progressing

## 2022-04-11 DIAGNOSIS — D5700 Hb-SS disease with crisis, unspecified: Secondary | ICD-10-CM | POA: Diagnosis not present

## 2022-04-11 DIAGNOSIS — D638 Anemia in other chronic diseases classified elsewhere: Secondary | ICD-10-CM

## 2022-04-11 DIAGNOSIS — G894 Chronic pain syndrome: Secondary | ICD-10-CM

## 2022-04-11 DIAGNOSIS — Z720 Tobacco use: Secondary | ICD-10-CM

## 2022-04-11 DIAGNOSIS — F329 Major depressive disorder, single episode, unspecified: Secondary | ICD-10-CM

## 2022-04-11 DIAGNOSIS — R112 Nausea with vomiting, unspecified: Secondary | ICD-10-CM

## 2022-04-11 MED ORDER — METOCLOPRAMIDE HCL 5 MG/ML IJ SOLN
5.0000 mg | Freq: Four times a day (QID) | INTRAMUSCULAR | Status: DC
  Administered 2022-04-11 – 2022-04-12 (×5): 5 mg via INTRAVENOUS
  Filled 2022-04-11 (×5): qty 2

## 2022-04-11 MED ORDER — LORAZEPAM 2 MG/ML IJ SOLN
1.0000 mg | Freq: Once | INTRAMUSCULAR | Status: AC
Start: 2022-04-11 — End: 2022-04-11
  Administered 2022-04-11: 1 mg via INTRAVENOUS
  Filled 2022-04-11: qty 1

## 2022-04-11 MED ORDER — ENSURE ENLIVE PO LIQD
237.0000 mL | Freq: Two times a day (BID) | ORAL | Status: DC
  Administered 2022-04-12: 237 mL via ORAL

## 2022-04-11 NOTE — Progress Notes (Signed)
Patient ID: Carmen [name], female   DOB: 1991-10-03, 31 y.o.   MRN: [id number] Subjective: Carmen [name] is a 31 year old female with a medical history significant for sickle cell disease, chronic pain syndrome, opiate dependence and tolerance, and history of anemia of chronic disease that was admitted and sickle cell pain crisis.  Patient noticed to have been vomiting x2 this morning shortly after eating her breakfast which contained croissant.  She is also intensely nauseous and is asking for medication different than Zofran.  Her pain also remained uncontrolled since she is not able to keep her oxycodone down.  She has no other symptoms, no diarrhea or constipation.  She denies any fever, cough or chest pain.  No shortness of breath or urinary symptoms.  Objective:  Vital signs in last 24 hours:  Vitals:   04/11/22 0408 04/11/22 0455 04/11/22 1021 04/11/22 1122  BP:  115/73 118/72   Pulse:  83 84   Resp: '14 20 18 15  '$ Temp:  98 F (36.7 C) 97.8 F (36.6 C)   TempSrc:  Oral Oral   SpO2: 97% 97% 95% 96%  Weight:      Height:        Intake/Output from previous day:  No intake or output data in the 24 hours ending 04/11/22 1251   Physical Exam: General: Alert, awake, oriented x3, in no acute distress.  HEENT: [place]/AT PEERL, EOMI Neck: Trachea midline,  no masses, no thyromegal,y no JVD, no carotid bruit OROPHARYNX:  Moist, No exudate/ erythema/lesions.  Heart: Regular rate and rhythm, without murmurs, rubs, gallops, PMI non-displaced, no heaves or thrills on palpation.  Lungs: Clear to auscultation, no wheezing or rhonchi noted. No increased vocal fremitus resonant to percussion  Abdomen: Soft, nontender, nondistended, positive bowel sounds, no masses no hepatosplenomegaly noted..  Neuro: No focal neurological deficits noted cranial nerves II through XII grossly intact. DTRs 2+ bilaterally upper and lower extremities. Strength 5 out of 5 in bilateral upper and lower  extremities. Musculoskeletal: No warm swelling or erythema around joints, no spinal tenderness noted. Psychiatric: Patient alert and oriented x3, good insight and cognition, good recent to remote recall. Lymph node survey: No cervical axillary or inguinal lymphadenopathy noted.  Lab Results:  Basic Metabolic Panel:    Component Value Date/Time   NA 138 04/08/2022 0821   NA 142 07/02/2020 1040   K 4.1 04/08/2022 0821   CL 108 04/08/2022 0821   CO2 22 04/08/2022 0821   BUN 9 04/08/2022 0821   BUN 8 07/02/2020 1040   CREATININE 0.51 04/08/2022 0821   GLUCOSE 95 04/08/2022 0821   CALCIUM 9.2 04/08/2022 0821   CBC:    Component Value Date/Time   WBC 7.2 04/08/2022 0821   HGB 9.9 (L) 04/08/2022 0821   HGB 9.8 (L) 07/02/2020 1040   HCT 29.6 (L) 04/08/2022 0821   HCT 28.9 (L) 07/02/2020 1040   PLT 406 (H) 04/08/2022 0821   PLT 628 (H) 07/02/2020 1040   MCV 89.2 04/08/2022 0821   MCV 88 07/02/2020 1040   NEUTROABS 3.3 04/08/2022 0821   NEUTROABS 3.0 07/02/2020 1040   LYMPHSABS 2.6 04/08/2022 0821   LYMPHSABS 3.5 (H) 07/02/2020 1040   MONOABS 0.9 04/08/2022 0821   EOSABS 0.3 04/08/2022 0821   EOSABS 0.2 07/02/2020 1040   BASOSABS 0.0 04/08/2022 0821   BASOSABS 0.1 07/02/2020 1040    Recent Results (from the past 240 hour(s))  SARS Coronavirus 2 by RT PCR (hospital order, performed in [facility] hospital  lab) *cepheid single result test* Anterior Nasal Swab     Status: None   Collection Time: 04/07/22  7:09 PM   Specimen: Anterior Nasal Swab  Result Value Ref Range Status   SARS Coronavirus 2 by RT PCR NEGATIVE NEGATIVE Final    Comment: (NOTE) SARS-CoV-2 target nucleic acids are NOT DETECTED.  The SARS-CoV-2 RNA is generally detectable in upper and lower respiratory specimens during the acute phase of infection. The lowest concentration of SARS-CoV-2 viral copies this assay can detect is 250 copies / mL. A negative result does not preclude SARS-CoV-2 infection and  should not be used as the sole basis for treatment or other patient management decisions.  A negative result may occur with improper specimen collection / handling, submission of specimen other than nasopharyngeal swab, presence of viral mutation(s) within the areas targeted by this assay, and inadequate number of viral copies (<250 copies / mL). A negative result must be combined with clinical observations, patient history, and epidemiological information.  Fact Sheet for Patients:   https://www.patel.info/  Fact Sheet for Healthcare Providers: https://hall.com/  This test is not yet approved or  cleared by the [place] FDA and has been authorized for detection and/or diagnosis of SARS-CoV-2 by FDA under an Emergency Use Authorization (EUA).  This EUA will remain in effect (meaning this test can be used) for the duration of the COVID-19 declaration under Section 564(b)(1) of the Act, 21 U.S.C. section 360bbb-3(b)(1), unless the authorization is terminated or revoked sooner.  Performed at [facility], [place] [address], [place], [place] [address]     Studies/Results: DG Lumbar Spine Complete  Result Date: 04/09/2022 CLINICAL DATA:  Low back pain, pain right lower extremity EXAM: LUMBAR SPINE - COMPLETE 4+ VIEW COMPARISON:  11/11/2011 FINDINGS: No recent fracture is seen. Alignment of posterior margins of vertebral bodies is unremarkable. There is no disc space narrowing. Paraspinal soft tissues are unremarkable. Surgical clips are seen in the right upper quadrant of abdomen. IMPRESSION: No significant radiographic abnormality is seen in the lumbar spine. Electronically Signed   By: Elmer Picker M.D.   On: 04/09/2022 16:01    Medications: Scheduled Meds:  citalopram  20 mg Oral q morning   DULoxetine  60 mg Oral Daily   enoxaparin (LOVENOX) injection  40 mg Subcutaneous Daily   folic acid  1 mg Oral Daily    HYDROmorphone   Intravenous Q4H   ketorolac  15 mg Intravenous Q6H   metoCLOPramide (REGLAN) injection  5 mg Intravenous Q6H   morphine  60 mg Oral Q12H   nicotine  14 mg Transdermal Daily   senna-docusate  1 tablet Oral BID   Continuous Infusions: PRN Meds:.cyclobenzaprine, diphenhydrAMINE, hydrOXYzine, naloxone **AND** sodium chloride flush, ondansetron **OR** ondansetron (ZOFRAN) IV, oxyCODONE, polyethylene glycol  Consultants: None  Procedures: None  Antibiotics: None  Assessment/Plan: Principal Problem:   Sickle cell pain crisis ([place]) Active Problems:   Anemia of chronic disease   Tobacco abuse  Hb Sickle Cell Disease with Pain crisis: Continue IVF at [facility], continue weight based Dilaudid PCA at current dose setting, continue IV Toradol 15 mg Q 6 H for total of 5 days, continue oral pain medications as ordered for breakthrough pain.  Monitor vitals very closely, Re-evaluate pain scale regularly, 2 L of Oxygen by [place]. Nausea and vomiting: Add metoclopramide 5 mg every 6 hourly as needed vomiting.  We will restart IV fluid if patient is unable to keep food down.  We will monitor closely.  Anemia of Chronic Disease: Hemoglobin continues to remain stable at baseline.  There is no clinical indication for blood transfusion at this time.  We will continue to monitor closely and transfuse as appropriate. Chronic pain Syndrome: Continue oral home pain medications. Major depression: Stable clinically.  Patient denies any suicidal ideations or thoughts.  We will continue her home medications. Tobacco abuse:Nelli was counseled on the dangers of tobacco use, and was advised to quit. Reviewed strategies to maximize success, including removing cigarettes and smoking materials from environment, stress management and support of family/friends.   Code Status: Full Code Family Communication: N/A Disposition Plan: Not yet ready for discharge  Carmen [name]  If 7PM-7AM, please contact  night-coverage.  04/11/2022, 12:51 PM  LOS: 4 days

## 2022-04-11 NOTE — Plan of Care (Signed)
  Problem: Bowel/Gastric: Goal: Gut motility will be maintained Outcome: Progressing   

## 2022-04-12 ENCOUNTER — Telehealth: Payer: Self-pay

## 2022-04-12 DIAGNOSIS — D5700 Hb-SS disease with crisis, unspecified: Secondary | ICD-10-CM | POA: Diagnosis not present

## 2022-04-12 LAB — CBC WITH DIFFERENTIAL/PLATELET
Abs Immature Granulocytes: 0.05 10*3/uL (ref 0.00–0.07)
Basophils Absolute: 0.1 10*3/uL (ref 0.0–0.1)
Basophils Relative: 1 %
Eosinophils Absolute: 0.5 10*3/uL (ref 0.0–0.5)
Eosinophils Relative: 5 %
HCT: 24.3 % — ABNORMAL LOW (ref 36.0–46.0)
Hemoglobin: 8.6 g/dL — ABNORMAL LOW (ref 12.0–15.0)
Immature Granulocytes: 0 %
Lymphocytes Relative: 31 %
Lymphs Abs: 3.6 10*3/uL (ref 0.7–4.0)
MCH: 29.6 pg (ref 26.0–34.0)
MCHC: 35.4 g/dL (ref 30.0–36.0)
MCV: 83.5 fL (ref 80.0–100.0)
Monocytes Absolute: 1.5 10*3/uL — ABNORMAL HIGH (ref 0.1–1.0)
Monocytes Relative: 13 %
Neutro Abs: 5.8 10*3/uL (ref 1.7–7.7)
Neutrophils Relative %: 50 %
Platelets: 430 10*3/uL — ABNORMAL HIGH (ref 150–400)
RBC: 2.91 MIL/uL — ABNORMAL LOW (ref 3.87–5.11)
RDW: 14.5 % (ref 11.5–15.5)
WBC: 11.4 10*3/uL — ABNORMAL HIGH (ref 4.0–10.5)
nRBC: 0.4 % — ABNORMAL HIGH (ref 0.0–0.2)

## 2022-04-12 LAB — BASIC METABOLIC PANEL
Anion gap: 7 (ref 5–15)
BUN: 24 mg/dL — ABNORMAL HIGH (ref 6–20)
CO2: 25 mmol/L (ref 22–32)
Calcium: 9.8 mg/dL (ref 8.9–10.3)
Chloride: 108 mmol/L (ref 98–111)
Creatinine, Ser: 0.55 mg/dL (ref 0.44–1.00)
GFR, Estimated: >60 mL/min (ref >60)
Glucose, Bld: 95 mg/dL (ref 70–99)
Potassium: 4.9 mmol/L (ref 3.5–5.1)
Sodium: 140 mmol/L (ref 135–145)

## 2022-04-12 MED ORDER — HYDROMORPHONE 1 MG/ML IV SOLN
INTRAVENOUS | Status: DC
  Administered 2022-04-12: 30 mg via INTRAVENOUS
  Filled 2022-04-12: qty 30

## 2022-04-12 NOTE — Progress Notes (Signed)
   04/12/22 1212  Assess: MEWS Score  Temp 98.3 F (36.8 C)  BP (!) 102/58  MAP (mmHg) 72  Pulse Rate (!) 119  Resp 17  SpO2 96 %  Assess: MEWS Score  MEWS Temp 0  MEWS Systolic 0  MEWS Pulse 2  MEWS RR 0  MEWS LOC 0  MEWS Score 2  MEWS Score Color Yellow  Assess: if the MEWS score is Yellow or Red  Were vital signs taken at a resting state? Yes  Focused Assessment No change from prior assessment  Does the patient meet 2 or more of the SIRS criteria? No  MEWS guidelines implemented *See Row Information* Yes  Take Vital Signs  Increase Vital Sign Frequency  Yellow: Q 2hr X 2 then Q 4hr X 2, if remains yellow, continue Q 4hrs  Escalate  MEWS: Escalate Yellow: discuss with charge nurse/RN and consider discussing with provider and RRT  Notify: Charge Nurse/RN  Name of Charge Nurse/RN Notified Euka RN  Date Charge Nurse/RN Notified 04/12/22  Time Charge Nurse/RN Notified 1215  Document  Patient Outcome Other (Comment)  Progress note created (see row info) Yes  Assess: SIRS CRITERIA  SIRS Temperature  0  SIRS Pulse 1  SIRS Respirations  0  SIRS WBC 0  SIRS Score Sum  1

## 2022-04-12 NOTE — Progress Notes (Signed)
Patient being discharged home. Discharge instructions reviewed with patient. Patient verbalized full understanding. Patients family member is her ride home.

## 2022-04-12 NOTE — Telephone Encounter (Signed)
Prior authorization started and approved for Morphine sulfate ER 60 mg ER tablets   Confirmation #:[id number] Prior Approval   #:[id number] Status:APPROVED

## 2022-04-12 NOTE — Discharge Summary (Signed)
Physician Discharge Summary  Carmen [name] WVP:[id number] DOB: 27-May-1991 DOA: 04/07/2022  PCP: [name], FNP  Admit date: 04/07/2022  Discharge date: 04/12/2022  Discharge Diagnoses:  Principal Problem:   Sickle cell pain crisis ([place]) Active Problems:   Anemia of chronic disease   Tobacco abuse   Major depressive disorder   Chronic pain syndrome   Nausea & vomiting   Discharge Condition: Stable  Disposition:   Follow-up Information     [name], FNP Follow up.   Specialty: Family Medicine Contact information: [place]. [place] [address] [phone]                Pt is discharged home in good condition and is to follow up with [name], FNP this week to have labs evaluated. Carmen [name] is instructed to increase activity slowly and balance with rest for the next few days, and use prescribed medication to complete treatment of pain  Diet: Regular Wt Readings from Last 3 Encounters:  04/07/22 90.5 kg  03/28/22 86.2 kg  03/03/22 86.2 kg    History of present illness:  Carmen [name] is a 31 year old female with a medical history significant for sickle cell disease, chronic pain syndrome, opiate dependence and tolerance, history of anemia of chronic disease, and major depressive disorder who presented to the emergency department with back and leg pain.  She reports intractable pain of the back and hips bilaterally.  Patient reports that she was not able to control her pain at home.  She is on MS Contin scheduled and oxycodone as needed.  She denies running out of these medications.  Patient is a chronic everyday tobacco user without any plans of quitting.  In the ER, patient received 3 rounds of Dilaudid and pain was unrelieved.  She was admitted for further management of her sickle cell pain crisis.  In the ER, patient had 3 rounds of Dilaudid.  Hospital Course:  Sickle cell disease with pain crisis: Patient was admitted  for sickle cell pain crisis and managed appropriately with IVF, IV Dilaudid via PCA and IV Toradol, as well as other adjunct therapies per sickle cell pain management protocols.  IV Dilaudid PCA weaned appropriately and patient transition to her home medications. Her pain intensity decreased to 5/10 and she is requesting discharge home.  She feels that she can manage at home on current medication regimen. Patient is alert, oriented, and ambulating without assistance.  She will discharge home in a hemodynamically stable condition and she is aware of all follow-up appointments.  Patient was therefore discharged home today in a hemodynamically stable condition.   Carmen [name] will follow-up with PCP within 1 week of this discharge. Carmen [name] was counseled extensively about nonpharmacologic means of pain management, patient verbalized understanding and was appreciative of  the care received during this admission.   We discussed the need for good hydration, monitoring of hydration status, avoidance of heat, cold, stress, and infection triggers. We discussed the need to be adherent with taking  home medications. Patient was reminded of the need to seek medical attention immediately if any symptom of bleeding, anemia, or infection occurs.  Discharge Exam: Vitals:   04/12/22 1210 04/12/22 1212  BP:  (!) 102/58  Pulse:  (!) 119  Resp: 20 17  Temp:  98.3 F (36.8 C)  SpO2: 99% 96%   Vitals:   04/12/22 0454 04/12/22 0727 04/12/22 1210 04/12/22 1212  BP: 111/62   (!) 102/58  Pulse: 91   (!) 119  Resp: '20 18 20 17  '$ Temp: 98.5 F (36.9 C)   98.3 F (36.8 C)  TempSrc: Oral   Oral  SpO2: 99% 98% 99% 96%  Weight:      Height:        General appearance : Awake, alert, not in any distress. Speech Clear. Not toxic looking HEENT: Atraumatic and Normocephalic, pupils equally reactive to light and accomodation Neck: Supple, no JVD. No cervical lymphadenopathy.  Chest: Good air entry bilaterally, no added sounds   CVS: S1 S2 regular, no murmurs.  Abdomen: Bowel sounds present, Non tender and not distended with no gaurding, rigidity or rebound. Extremities: B/L Lower Ext shows no edema, both legs are warm to touch Neurology: Awake alert, and oriented X 3, CN II-XII intact, Non focal Skin: No Rash  Discharge Instructions  Discharge Instructions     Discharge patient   Complete by: As directed    Discharge disposition: 01-Home or Self Care   Discharge patient date: 04/12/2022      Allergies as of 04/12/2022       Reactions   Ketamine Hives, Other (See Comments)   Makes feel funny        Medication List     TAKE these medications    betamethasone valerate ointment 0.1 % Commonly known as: VALISONE APPLY TO AFFECTED AREA TWICE A DAY What changed: additional instructions   citalopram 20 MG tablet Commonly known as: CELEXA Take 1 tablet (20 mg total) by mouth every morning.   cyclobenzaprine 10 MG tablet Commonly known as: FLEXERIL Take 1 tablet (10 mg total) by mouth daily as needed for muscle spasms.   DULoxetine 30 MG capsule Commonly known as: CYMBALTA Take 2 capsules (60 mg total) by mouth daily.   folic acid 1 MG tablet Commonly known as: FOLVITE Take 1 tablet (1 mg total) by mouth daily. What changed: when to take this   morphine 60 MG 12 hr tablet Commonly known as: MS CONTIN Take 1 tablet (60 mg total) by mouth every 12 (twelve) hours.   Oxycodone HCl 10 MG Tabs Take 10 mg by mouth every 6 (six) hours as needed (pain).   Prenatal Vitamin Plus Low Iron 27-1 MG Tabs Take 1 tablet by mouth every morning.   Vitamin D (Ergocalciferol) 1.25 MG (50000 UNIT) Caps capsule Commonly known as: DRISDOL TAKE ONE CAPSULE BY MOUTH ONE TIME PER WEEK What changed:  how much to take how to take this when to take this additional instructions        The results of significant diagnostics from this hospitalization (including imaging, microbiology, ancillary and  laboratory) are listed below for reference.    Significant Diagnostic Studies: DG Lumbar Spine Complete  Result Date: 04/09/2022 CLINICAL DATA:  Low back pain, pain right lower extremity EXAM: LUMBAR SPINE - COMPLETE 4+ VIEW COMPARISON:  11/11/2011 FINDINGS: No recent fracture is seen. Alignment of posterior margins of vertebral bodies is unremarkable. There is no disc space narrowing. Paraspinal soft tissues are unremarkable. Surgical clips are seen in the right upper quadrant of abdomen. IMPRESSION: No significant radiographic abnormality is seen in the lumbar spine. Electronically Signed   By: Elmer Picker M.D.   On: 04/09/2022 16:01   DG Hips Bilat W or Wo Pelvis 3-4 Views  Result Date: 04/07/2022 CLINICAL DATA:  Sickle cell with hip pain, initial encounter EXAM: DG HIP (WITH OR WITHOUT PELVIS) 4V BILAT COMPARISON:  None Available. FINDINGS: Pelvic ring is  intact. No acute fracture or dislocation is noted. No findings to suggest avascular necrosis are noted at this time. No soft tissue abnormality is noted. IMPRESSION: No acute abnormality noted. Electronically Signed   By: Inez Catalina M.D.   On: 04/07/2022 19:28    Microbiology: Recent Results (from the past 240 hour(s))  SARS Coronavirus 2 by RT PCR (hospital order, performed in [facility] hospital lab) *cepheid single result test* Anterior Nasal Swab     Status: None   Collection Time: 04/07/22  7:09 PM   Specimen: Anterior Nasal Swab  Result Value Ref Range Status   SARS Coronavirus 2 by RT PCR NEGATIVE NEGATIVE Final    Comment: (NOTE) SARS-CoV-2 target nucleic acids are NOT DETECTED.  The SARS-CoV-2 RNA is generally detectable in upper and lower respiratory specimens during the acute phase of infection. The lowest concentration of SARS-CoV-2 viral copies this assay can detect is 250 copies / mL. A negative result does not preclude SARS-CoV-2 infection and should not be used as the sole basis for treatment or other patient  management decisions.  A negative result may occur with improper specimen collection / handling, submission of specimen other than nasopharyngeal swab, presence of viral mutation(s) within the areas targeted by this assay, and inadequate number of viral copies (<250 copies / mL). A negative result must be combined with clinical observations, patient history, and epidemiological information.  Fact Sheet for Patients:   https://www.patel.info/  Fact Sheet for Healthcare Providers: https://hall.com/  This test is not yet approved or  cleared by the [place] FDA and has been authorized for detection and/or diagnosis of SARS-CoV-2 by FDA under an Emergency Use Authorization (EUA).  This EUA will remain in effect (meaning this test can be used) for the duration of the COVID-19 declaration under Section 564(b)(1) of the Act, 21 U.S.C. section 360bbb-3(b)(1), unless the authorization is terminated or revoked sooner.  Performed at [facility], [place] [address] [place], [place] [address]      Labs: Basic Metabolic Panel: Recent Labs  Lab 04/07/22 1647 04/08/22 0821 04/12/22 0540  NA 139 138 140  K 3.7 4.1 4.9  CL 110 108 108  CO2 '23 22 25  '$ GLUCOSE 93 95 95  BUN 10 9 24*  CREATININE 0.60 0.51 0.55  CALCIUM 9.6 9.2 9.8   Liver Function Tests: Recent Labs  Lab 04/07/22 1647 04/08/22 0821  AST 30 37  ALT 44 38  ALKPHOS 70 53  BILITOT 0.9 1.4*  PROT 8.7* 7.6  ALBUMIN 4.5 3.8   No results for input(s): "LIPASE", "AMYLASE" in the last 168 hours. No results for input(s): "AMMONIA" in the last 168 hours. CBC: Recent Labs  Lab 04/07/22 1647 04/08/22 0821 04/12/22 0540  WBC 9.3 7.2 11.4*  NEUTROABS 5.6 3.3 5.8  HGB 10.5* 9.9* 8.6*  HCT 29.0* 29.6* 24.3*  MCV 84.1 89.2 83.5  PLT 521* 406* 430*   Cardiac Enzymes: No results for input(s): "CKTOTAL", "CKMB", "CKMBINDEX", "TROPONINI" in the last 168  hours. BNP: Invalid input(s): "POCBNP" CBG: No results for input(s): "GLUCAP" in the last 168 hours.  Time coordinating discharge: 30 minutes  Signed:  [name]  APRN, MSN, FNP-C [facility] [address] [place],  [address] [phone]  [facility] 04/12/2022, 1:04 PM

## 2022-04-13 ENCOUNTER — Telehealth: Payer: Self-pay

## 2022-04-13 ENCOUNTER — Telehealth (HOSPITAL_COMMUNITY): Payer: Self-pay | Admitting: *Deleted

## 2022-04-13 ENCOUNTER — Non-Acute Institutional Stay (HOSPITAL_COMMUNITY)
Admission: AD | Admit: 2022-04-13 | Discharge: 2022-04-13 | Disposition: A | Discharge status: Home / Self Care | Payer: Medicaid Other | Source: Ambulatory Visit | Attending: Internal Medicine | Admitting: Internal Medicine

## 2022-04-13 DIAGNOSIS — F329 Major depressive disorder, single episode, unspecified: Secondary | ICD-10-CM | POA: Insufficient documentation

## 2022-04-13 DIAGNOSIS — D5700 Hb-SS disease with crisis, unspecified: Secondary | ICD-10-CM | POA: Diagnosis present

## 2022-04-13 DIAGNOSIS — F1120 Opioid dependence, uncomplicated: Secondary | ICD-10-CM | POA: Insufficient documentation

## 2022-04-13 DIAGNOSIS — G894 Chronic pain syndrome: Secondary | ICD-10-CM | POA: Diagnosis not present

## 2022-04-13 MED ORDER — HYDROMORPHONE 1 MG/ML IV SOLN
INTRAVENOUS | Status: DC
  Administered 2022-04-13: 6.5 mg via INTRAVENOUS
  Administered 2022-04-13: 30 mg via INTRAVENOUS
  Filled 2022-04-13: qty 30

## 2022-04-13 MED ORDER — ONDANSETRON HCL 4 MG/2ML IJ SOLN: 4.0000 mg | Freq: Four times a day (QID) | INTRAMUSCULAR | Status: DC | PRN

## 2022-04-13 MED ORDER — SODIUM CHLORIDE 0.9% FLUSH: 9.0000 mL | INTRAVENOUS | Status: DC | PRN

## 2022-04-13 MED ORDER — NALOXONE HCL 0.4 MG/ML IJ SOLN: 0.4000 mg | INTRAMUSCULAR | Status: DC | PRN

## 2022-04-13 MED ORDER — DIPHENHYDRAMINE HCL 25 MG PO CAPS: 25.0000 mg | ORAL_CAPSULE | ORAL | Status: DC | PRN

## 2022-04-13 MED ORDER — ACETAMINOPHEN 500 MG PO TABS
1000.0000 mg | ORAL_TABLET | Freq: Once | ORAL | Status: AC
  Administered 2022-04-13: 1000 mg via ORAL
  Filled 2022-04-13: qty 2

## 2022-04-13 MED ORDER — KETOROLAC TROMETHAMINE 30 MG/ML IJ SOLN
15.0000 mg | Freq: Once | INTRAMUSCULAR | Status: AC
  Filled 2022-04-13: qty 1

## 2022-04-13 MED ORDER — SODIUM CHLORIDE 0.45 % IV SOLN: INTRAVENOUS | Status: DC

## 2022-04-13 MED ORDER — KETOROLAC TROMETHAMINE 30 MG/ML IJ SOLN
15.0000 mg | Freq: Once | INTRAMUSCULAR | Status: AC
  Administered 2022-04-13: 15 mg via INTRAVENOUS

## 2022-04-13 NOTE — H&P (Signed)
[facility] History and Physical   Date: 04/13/2022  Patient name: Carmen [name] Medical record number: [id number] Date of birth: 1991-02-11 Age: 31 y.o. Gender: female PCP: [name], FNP  Attending physician: Tresa Garter, MD  Chief Complaint: Sickle cell pain  History of Present Illness: Carmen [name] is a 31 year old female with a medical history significant for sickle cell disease, chronic pain syndrome, opiate dependence and tolerance, and history of major depression presents with complaints of low back pain that is consistent with her typical sickle cell related pain.  Patient was discharged from inpatient services on 04/12/2022.  She states that pain intensity returned shortly after arriving home and she attributes it to being out of her long-acting pain medication.  Patient typically takes morphine 60 mg every 12 hours.  She has not been able to pick up medication due to insurance constraints.  She currently rates her pain as 8/10, constant, and aching.  She last had oxycodone this a.m. without sustained relief.  She denies any fever, chills, headache, dizziness, chest pain, or shortness of breath.  No urinary symptoms, nausea, vomiting, or diarrhea.  Meds: Medications Prior to Admission  Medication Sig Dispense Refill Last Dose   betamethasone valerate ointment (VALISONE) 0.1 % APPLY TO AFFECTED AREA TWICE A DAY (Patient taking differently: 1 application. 2 (two) times daily as needed (sensitive skin).) 30 g 1    citalopram (CELEXA) 20 MG tablet Take 1 tablet (20 mg total) by mouth every morning. 30 tablet 11    cyclobenzaprine (FLEXERIL) 10 MG tablet Take 1 tablet (10 mg total) by mouth daily as needed for muscle spasms. 30 tablet 1    DULoxetine (CYMBALTA) 30 MG capsule Take 2 capsules (60 mg total) by mouth daily. 956 capsule 1    folic acid (FOLVITE) 1 MG tablet Take 1 tablet (1 mg total) by mouth daily. (Patient taking differently: Take 1 mg by mouth  every morning.) 90 tablet 3    morphine (MS CONTIN) 60 MG 12 hr tablet Take 1 tablet (60 mg total) by mouth every 12 (twelve) hours. 60 tablet 0    Oxycodone HCl 10 MG TABS Take 10 mg by mouth every 6 (six) hours as needed (pain).      Prenatal Vit-Fe Fumarate-FA (PRENATAL VITAMIN PLUS LOW IRON) 27-1 MG TABS Take 1 tablet by mouth every morning.      Vitamin D, Ergocalciferol, (DRISDOL) 1.25 MG (50000 UNIT) CAPS capsule TAKE ONE CAPSULE BY MOUTH ONE TIME PER WEEK (Patient taking differently: Take 50,000 Units by mouth every Wednesday.) 30 capsule 1     Allergies: Ketamine Past Medical History:  Diagnosis Date   Anxiety    Headache(784.0)    Heart murmur    Sickle cell crisis ([place])    Syphilis 2015   Was diagnosed and received one injection of antibiotics   Thrombocytosis 11/22/2014     CBC Latest Ref Rng & Units 12/13/2018 12/11/2018 12/10/2018 WBC 4.0 - 10.5 K/uL 14.8(H) 13.2(H) 15.9(H) Hemoglobin 12.0 - 15.0 g/dL 8.2(L) 7.7(L) 8.1(L) Hematocrit 36.0 - 46.0 % 23.7(L) 23.2(L) 24.5(L) Platelets 150 - 400 K/uL 326 399 388     Past Surgical History:  Procedure Laterality Date   CESAREAN SECTION N/A 02/05/2019   Procedure: CESAREAN SECTION;  Surgeon: [name], MD;  Location: [facility] LD ORS;  Service: Obstetrics;  Laterality: N/A;   CHOLECYSTECTOMY N/A 11/30/2014   Procedure: LAPAROSCOPIC CHOLECYSTECTOMY SINGLE SITE WITH INTRAOPERATIVE CHOLANGIOGRAM;  Surgeon: Michael Boston, MD;  Location:  [facility] ORS;  Service: General;  Laterality: N/A;   SPLENECTOMY     Family History  Problem Relation Age of Onset   Hypertension Mother    Sickle cell anemia Sister    Kidney disease Sister        Lupus   Arthritis Sister    Sickle cell anemia Sister    Sickle cell trait Sister    Heart disease Maternal Aunt        CABG   Heart disease Maternal Uncle        CABG   Lupus Sister    Social History   Socioeconomic History   Marital status: Single    Spouse name: Not on file   Number of children: Not on  file   Years of education: Not on file   Highest education level: Not on file  Occupational History   Not on file  Tobacco Use   Smoking status: Some Days    Types: Cigarettes   Smokeless tobacco: Never  Vaping Use   Vaping Use: Never used  Substance and Sexual Activity   Alcohol use: No   Drug use: No   Sexual activity: Yes    Birth control/protection: Injection  Other Topics Concern   Not on file  Social History Narrative   Not on file   Social Determinants of Health   Financial Resource Strain: Not on file  Food Insecurity: Food Insecurity Present (11/15/2018)   Hunger Vital Sign    Worried About Running Out of Food in the Last Year: Sometimes true    Ran Out of Food in the Last Year: Sometimes true  Transportation Needs: No Transportation Needs (02/10/2022)   PRAPARE - Hydrologist (Medical): No    Lack of Transportation (Non-Medical): No  Physical Activity: Not on file  Stress: Not on file  Social Connections: Not on file  Intimate Partner Violence: Not on file    Review of Systems  Constitutional:  Negative for chills and fever.  HENT: Negative.    Eyes: Negative.   Respiratory: Negative.    Cardiovascular: Negative.   Genitourinary: Negative.   Musculoskeletal:  Positive for back pain and joint pain.  Neurological: Negative.   Psychiatric/Behavioral: Negative.      Physical Exam: There were no vitals taken for this visit. Physical Exam Constitutional:      Appearance: Normal appearance.  Eyes:     Pupils: Pupils are equal, round, and reactive to light.  Cardiovascular:     Rate and Rhythm: Normal rate and regular rhythm.     Pulses: Normal pulses.  Pulmonary:     Effort: Pulmonary effort is normal.  Abdominal:     General: Bowel sounds are normal.  Skin:    General: Skin is warm.  Neurological:     General: No focal deficit present.     Mental Status: She is alert. Mental status is at baseline.  Psychiatric:         Mood and Affect: Mood normal.        Behavior: Behavior normal.        Thought Content: Thought content normal.        Judgment: Judgment normal.      Lab results: No results found for this or any previous visit (from the past 24 hour(s)).  Imaging results:  No results found.   Assessment & Plan: Patient admitted to sickle cell day infusion center for management of pain crisis.  Patient is opiate  tolerant Initiate IV dilaudid PCA IV fluids, 0.45% 100 ml/hr Toradol 15 mg IV times one dose Tylenol 1000 mg by mouth times one dose Review CBC with differential, complete metabolic panel, and reticulocytes as results become available.  Pain intensity will be reevaluated in context of functioning and relationship to baseline as care progresses If pain intensity remains elevated and/or sudden change in hemodynamic stability transition to inpatient services for higher level of care.   [name]  APRN, MSN, FNP-C [facility] [address] [place], [place] [address] [phone]   04/13/2022, 10:29 AM

## 2022-04-13 NOTE — Progress Notes (Signed)
Patient admitted to the day infusion hospital for sickle cell pain. Initially, patient reported bilateral hip and back pain rated 9/10. For pain management, patient placed on Dilaudid PCA, given Tylenol, Toradol and hydrated with IV fluids. At discharge, patient rated pain at 5/10. Vital signs stable. Printed AVS offered but patient refused. Patient alert, oriented and ambulatory at discharge.

## 2022-04-13 NOTE — Telephone Encounter (Signed)
Morphine (MS Contin) '60mg'$   2nd prior authorization was started today for the patient medication due to the pharmacy stating they don't see where it was approved. Once this PA is approved I will fax over the approval to the pharmacy.

## 2022-04-13 NOTE — Discharge Summary (Signed)
[facility] Discharge Summary   Patient ID: Carmen [name] MRN: [id number] DOB/AGE: July 05, 1991 31 y.o.  Admit date: 04/13/2022 Discharge date: 04/13/2022  Primary Care Physician:  [name], FNP  Admission Diagnoses:  Active Problems:   Sickle cell pain crisis [facility])   Discharge Medications:  Allergies as of 04/13/2022       Reactions   Ketamine Hives, Other (See Comments)   Makes feel funny        Medication List     TAKE these medications    betamethasone valerate ointment 0.1 % Commonly known as: VALISONE APPLY TO AFFECTED AREA TWICE A DAY What changed:  how much to take when to take this reasons to take this additional instructions   citalopram 20 MG tablet Commonly known as: CELEXA Take 1 tablet (20 mg total) by mouth every morning.   cyclobenzaprine 10 MG tablet Commonly known as: FLEXERIL Take 1 tablet (10 mg total) by mouth daily as needed for muscle spasms.   DULoxetine 30 MG capsule Commonly known as: CYMBALTA Take 2 capsules (60 mg total) by mouth daily.   folic acid 1 MG tablet Commonly known as: FOLVITE Take 1 tablet (1 mg total) by mouth daily. What changed: when to take this   morphine 60 MG 12 hr tablet Commonly known as: MS CONTIN Take 1 tablet (60 mg total) by mouth every 12 (twelve) hours.   Oxycodone HCl 10 MG Tabs Take 10 mg by mouth every 6 (six) hours as needed (pain).   Prenatal Vitamin Plus Low Iron 27-1 MG Tabs Take 1 tablet by mouth every morning.   Vitamin D (Ergocalciferol) 1.25 MG (50000 UNIT) Caps capsule Commonly known as: DRISDOL TAKE ONE CAPSULE BY MOUTH ONE TIME PER WEEK What changed:  how much to take how to take this when to take this additional instructions         Consults:  None  Significant Diagnostic Studies:  DG Lumbar Spine Complete  Result Date: 04/09/2022 CLINICAL DATA:  Low back pain, pain right lower extremity EXAM: LUMBAR SPINE - COMPLETE 4+ VIEW COMPARISON:   11/11/2011 FINDINGS: No recent fracture is seen. Alignment of posterior margins of vertebral bodies is unremarkable. There is no disc space narrowing. Paraspinal soft tissues are unremarkable. Surgical clips are seen in the right upper quadrant of abdomen. IMPRESSION: No significant radiographic abnormality is seen in the lumbar spine. Electronically Signed   By: Elmer Picker M.D.   On: 04/09/2022 16:01   DG Hips Bilat W or Wo Pelvis 3-4 Views  Result Date: 04/07/2022 CLINICAL DATA:  Sickle cell with hip pain, initial encounter EXAM: DG HIP (WITH OR WITHOUT PELVIS) 4V BILAT COMPARISON:  None Available. FINDINGS: Pelvic ring is intact. No acute fracture or dislocation is noted. No findings to suggest avascular necrosis are noted at this time. No soft tissue abnormality is noted. IMPRESSION: No acute abnormality noted. Electronically Signed   By: Inez Catalina M.D.   On: 04/07/2022 19:28    History of present illness: Carmen [name] is a 31 year old female with a medical history significant for sickle cell disease, chronic pain syndrome, opiate dependence and tolerance, and history of major depression presents with complaints of low back pain that is consistent with her typical sickle cell related pain.  Patient was discharged from inpatient services on 04/12/2022.  She states that pain intensity returned shortly after arriving home and she attributes it to being out of her long-acting pain medication.  Patient typically takes morphine  60 mg every 12 hours.  She has not been able to pick up medication due to insurance constraints.  She currently rates her pain as 8/10, constant, and aching.  She last had oxycodone this a.m. without sustained relief.  She denies any fever, chills, headache, dizziness, chest pain, or shortness of breath.  No urinary symptoms, nausea, vomiting, or diarrhea. [facility] Course: Patient admitted to sickle cell day infusion clinic for management of pain  crisis Review laboratory values from 04/12/2022, did not warrant repeating on today Pain managed with IV Dilaudid PCA Toradol 15 mg IV x1 Tylenol 1000 mg x 1 IV fluids, 0.45% saline at 100 mL/h Patient's pain intensity decreased to 5/10 and she is requesting discharge home.  She is alert, oriented, and ambulating without assistance. Patient will discharge home in a hemodynamically stable condition.  Discharge instructions: Resume all home medications.   Follow up with PCP as previously  scheduled.   Discussed the importance of drinking 64 ounces of water daily, dehydration of red blood cells may lead further sickling.   Avoid all stressors that precipitate sickle cell pain crisis.     The patient was given clear instructions to go to ER or return to medical center if symptoms do not improve, worsen or new problems develop.   Physical Exam at Discharge:  BP 100/65 (BP Location: Left Arm)   Pulse (!) 109   Temp 98.5 F (36.9 C) (Temporal)   Resp 19   LMP 04/07/2022   SpO2 100%   Physical Exam Constitutional:      Appearance: Normal appearance.  Eyes:     Pupils: Pupils are equal, round, and reactive to light.  Cardiovascular:     Rate and Rhythm: Normal rate and regular rhythm.     Pulses: Normal pulses.  Pulmonary:     Effort: Pulmonary effort is normal.  Abdominal:     General: Bowel sounds are normal.  Musculoskeletal:        General: Normal range of motion.  Skin:    General: Skin is warm.  Neurological:     General: No focal deficit present.     Mental Status: She is alert. Mental status is at baseline.  Psychiatric:        Mood and Affect: Mood normal.        Behavior: Behavior normal.        Thought Content: Thought content normal.        Judgment: Judgment normal.     Disposition at Discharge: Discharge disposition: 01-Home or Self Care       Discharge Orders: Discharge Instructions     Discharge patient   Complete by: As directed     Discharge disposition: 01-Home or Self Care   Discharge patient date: 04/13/2022       Condition at Discharge:   Stable  Time spent on Discharge:  Greater than 30 minutes.  Signed: [name]  APRN, MSN, FNP-C Patient [facility] [address] [place], [place] [address] [phone]  04/13/2022, 2:23 PM

## 2022-04-13 NOTE — Telephone Encounter (Signed)
Patient called requesting to come to the day infusion hospital for sickle cell pain. Patient reports lower back and hip pain rated 9/10. Patient was discharged from the hospital yesterday. Patient reports being out of long acting pain medication and needing a prior authorization in order to get medication from pharmacy.  COVID-19 screening done. Patient denies fever, chest pain, nausea, vomiting, diarrhea and abdominal pain. Admits to having transportation without driving self. [place], [place] notified. Patient can come to the day hospital for pain management. Patient advised and expresses an understanding.

## 2022-04-14 NOTE — Telephone Encounter (Signed)
Confirmation #:[id number] WBenefit Plan:MCAIDHealth Plan:NCXIX Prior Approval #:[id number] PA Type:PHARMACYRecipient:Carmen R SIMMONSRecipient MC:[id number] OBilling Provider:Billing Provider PC:[id number] Provider Name:THE [place]  [facility] Provider HT:[id number] Submission Date:06/14/2023Status:APPROVEDEffective Begin Date:06/14/2023Effective End Date:12/11/2023Payer:[place] [employer] DIV OF HEALTH BENEFITS# of Attachments:1

## 2022-04-15 ENCOUNTER — Telehealth: Payer: Self-pay

## 2022-04-15 ENCOUNTER — Other Ambulatory Visit: Payer: Self-pay | Admitting: Family Medicine

## 2022-04-15 DIAGNOSIS — Z79891 Long term (current) use of opiate analgesic: Secondary | ICD-10-CM

## 2022-04-15 DIAGNOSIS — D571 Sickle-cell disease without crisis: Secondary | ICD-10-CM

## 2022-04-15 MED ORDER — OXYCODONE HCL 10 MG PO TABS: 10.0000 mg | ORAL_TABLET | Freq: Four times a day (QID) | ORAL | 0 refills | Status: DC | PRN

## 2022-04-15 NOTE — Progress Notes (Signed)
Reviewed PDMP substance reporting system prior to prescribing opiate medications. No inconsistencies noted.  Meds ordered this encounter  Medications   Oxycodone HCl 10 MG TABS    Sig: Take 1 tablet (10 mg total) by mouth every 6 (six) hours as needed (pain).    Dispense:  60 tablet    Refill:  0    Order Specific Question:   Supervising Provider    Answer:   [name], [name] [[id number]]   Ysabela Keisler Moore Dailan Pfalzgraf  APRN, MSN, FNP-C Patient [facility] [address]  [place], [place] [address] [phone]  

## 2022-04-15 NOTE — Telephone Encounter (Signed)
Oxycodone  °

## 2022-04-16 ENCOUNTER — Telehealth: Payer: Self-pay

## 2022-04-16 NOTE — Telephone Encounter (Signed)
Transition Care Management Unsuccessful Follow-up Telephone Call  Date of discharge and from where:  04/12/22 Carmen [name]  Attempts:  1st Attempt  Reason for unsuccessful TCM follow-up call:  Left voice message   [name], Arita Miss, CDP Social Worker, Certified Dementia Practitioner [facility] Care Management [phone]

## 2022-04-19 ENCOUNTER — Telehealth: Payer: Self-pay

## 2022-04-19 NOTE — Telephone Encounter (Signed)
Transition Care Management Unsuccessful Follow-up Telephone Call  Date of discharge and from where:  04/12/22 Carmen [name]   Attempts:  2nd Attempt  Reason for unsuccessful TCM follow-up call:  Left voice message  [name], BSW, CDP Social Worker, Certified Dementia Practitioner [facility] Care Management [phone]

## 2022-04-20 ENCOUNTER — Telehealth: Payer: Self-pay

## 2022-04-20 NOTE — Telephone Encounter (Signed)
Pt called and left a voice mess about her Morphine needing a PA

## 2022-04-21 ENCOUNTER — Telehealth: Payer: Self-pay

## 2022-04-21 NOTE — Telephone Encounter (Signed)
PA  APPROVEDDrug Name/Code:MOR SUL ER Confirmation #:[id number] WBenefit Plan:MCAIDHealth Plan:NCXIX Prior Approval #:[id number] PA Type:PHARMACYRecipient:Sl[place] R SIMMONSRecipient QI:[id number] OBilling Provider:Billing Provider LO:[id number] Provider Name:THE [facility] Provider OA:[id number] Submission Date:06/21/2023Status:APPROVEDEffective Begin Date:06/21/2023Effective End Date:10/18/2022

## 2022-04-21 NOTE — Telephone Encounter (Signed)
PA started for Morphine Sulf Er '60MG'$  tablet [id number] W waiting on insurance approval

## 2022-04-21 NOTE — Telephone Encounter (Signed)
PA done waiting on insurance approval

## 2022-04-23 ENCOUNTER — Telehealth: Payer: Self-pay

## 2022-04-27 ENCOUNTER — Telehealth: Payer: Self-pay | Admitting: Family Medicine

## 2022-04-27 NOTE — Telephone Encounter (Signed)
Patient called to request that the CMA calls Medicaid to correct her Prior Auth to be backdated to 04/13/22 because she paid out of pocket for the medication and the provider has to contact Medicaid to correct so she can be reimbursed.

## 2022-04-28 ENCOUNTER — Telehealth: Payer: Self-pay | Admitting: Family Medicine

## 2022-04-28 NOTE — Telephone Encounter (Signed)
Pt called stating that her morphine 60 mg prior authorization was approved but she had already paid $215 to pick the prescription up. Medicaid and the Pharmacy have told her that to get her refund her prior authorization just needs to be backdated to April 13, 2022.

## 2022-05-02 ENCOUNTER — Other Ambulatory Visit: Payer: Self-pay | Admitting: Family Medicine

## 2022-05-02 DIAGNOSIS — D571 Sickle-cell disease without crisis: Secondary | ICD-10-CM

## 2022-05-02 DIAGNOSIS — Z79891 Long term (current) use of opiate analgesic: Secondary | ICD-10-CM

## 2022-05-02 MED ORDER — OXYCODONE HCL 10 MG PO TABS: 10.0000 mg | ORAL_TABLET | Freq: Four times a day (QID) | ORAL | 0 refills | Status: DC | PRN

## 2022-05-02 NOTE — Progress Notes (Signed)
Reviewed PDMP substance reporting system prior to prescribing opiate medications. No inconsistencies noted.  Meds ordered this encounter  Medications   Oxycodone HCl 10 MG TABS    Sig: Take 1 tablet (10 mg total) by mouth every 6 (six) hours as needed (pain).    Dispense:  60 tablet    Refill:  0    Order Specific Question:   Supervising Provider    Answer:   [name], [name] [[id number]]   Carmen [name] Dannon Nguyenthi  APRN, MSN, FNP-C Patient Care Center [facility] [address]  [place], [place] [address] [phone]  

## 2022-05-07 ENCOUNTER — Other Ambulatory Visit: Payer: Self-pay | Admitting: Family Medicine

## 2022-05-07 ENCOUNTER — Other Ambulatory Visit: Payer: Self-pay

## 2022-05-07 DIAGNOSIS — D571 Sickle-cell disease without crisis: Secondary | ICD-10-CM

## 2022-05-07 DIAGNOSIS — Z79891 Long term (current) use of opiate analgesic: Secondary | ICD-10-CM

## 2022-05-07 MED ORDER — MORPHINE SULFATE ER 60 MG PO TBCR: 60.0000 mg | EXTENDED_RELEASE_TABLET | Freq: Two times a day (BID) | ORAL | 0 refills | Status: DC

## 2022-05-07 MED ORDER — OXYCODONE HCL 10 MG PO TABS: 10.0000 mg | ORAL_TABLET | Freq: Four times a day (QID) | ORAL | 0 refills | Status: DC | PRN

## 2022-05-07 NOTE — Telephone Encounter (Signed)
Requesting: Morphine Contract: N/A UDS: N/A Last Visit: 09/23/21 Next Visit: 05/18/22 Last Refill: 04/07/22(60,0)  Please review and advise. Medication pending

## 2022-05-07 NOTE — Progress Notes (Signed)
Reviewed PDMP substance reporting system prior to prescribing opiate medications. No inconsistencies noted.  Meds ordered this encounter  Medications   Oxycodone HCl 10 MG TABS    Sig: Take 1 tablet (10 mg total) by mouth every 6 (six) hours as needed (pain).    Dispense:  60 tablet    Refill:  0    Order Specific Question:   Supervising Provider    Answer:   Tresa Garter [[id number]]   morphine (MS CONTIN) 60 MG 12 hr tablet    Sig: Take 1 tablet (60 mg total) by mouth every 12 (twelve) hours.    Dispense:  60 tablet    Refill:  0    Order Specific Question:   Supervising Provider    Answer:   Tresa Garter [[id number]]   [name]  APRN, MSN, FNP-C Patient [facility] [address] [place], [place] [address] [phone]

## 2022-05-10 ENCOUNTER — Other Ambulatory Visit: Payer: Self-pay

## 2022-05-10 ENCOUNTER — Inpatient Hospital Stay (HOSPITAL_COMMUNITY)
Admission: EM | Admit: 2022-05-10 | Discharge: 2022-05-12 | DRG: 812 | Disposition: A | Discharge status: Home / Self Care | Payer: Medicaid Other | Attending: Internal Medicine | Admitting: Internal Medicine

## 2022-05-10 ENCOUNTER — Encounter (HOSPITAL_COMMUNITY): Payer: Self-pay

## 2022-05-10 DIAGNOSIS — D5700 Hb-SS disease with crisis, unspecified: Principal | ICD-10-CM | POA: Diagnosis present

## 2022-05-10 DIAGNOSIS — Z9049 Acquired absence of other specified parts of digestive tract: Secondary | ICD-10-CM | POA: Diagnosis not present

## 2022-05-10 DIAGNOSIS — G894 Chronic pain syndrome: Secondary | ICD-10-CM | POA: Diagnosis present

## 2022-05-10 DIAGNOSIS — Z832 Family history of diseases of the blood and blood-forming organs and certain disorders involving the immune mechanism: Secondary | ICD-10-CM

## 2022-05-10 DIAGNOSIS — F329 Major depressive disorder, single episode, unspecified: Secondary | ICD-10-CM | POA: Diagnosis present

## 2022-05-10 DIAGNOSIS — Z888 Allergy status to other drugs, medicaments and biological substances status: Secondary | ICD-10-CM | POA: Diagnosis not present

## 2022-05-10 DIAGNOSIS — Z9081 Acquired absence of spleen: Secondary | ICD-10-CM

## 2022-05-10 DIAGNOSIS — D638 Anemia in other chronic diseases classified elsewhere: Secondary | ICD-10-CM | POA: Diagnosis present

## 2022-05-10 DIAGNOSIS — F419 Anxiety disorder, unspecified: Secondary | ICD-10-CM | POA: Diagnosis present

## 2022-05-10 DIAGNOSIS — F17210 Nicotine dependence, cigarettes, uncomplicated: Secondary | ICD-10-CM | POA: Diagnosis present

## 2022-05-10 DIAGNOSIS — Z79899 Other long term (current) drug therapy: Secondary | ICD-10-CM | POA: Diagnosis not present

## 2022-05-10 LAB — CBC WITH DIFFERENTIAL/PLATELET
Abs Immature Granulocytes: 0.02 10*3/uL (ref 0.00–0.07)
Basophils Absolute: 0 10*3/uL (ref 0.0–0.1)
Basophils Relative: 0 %
Eosinophils Absolute: 0.3 10*3/uL (ref 0.0–0.5)
Eosinophils Relative: 3 %
HCT: 28.2 % — ABNORMAL LOW (ref 36.0–46.0)
Hemoglobin: 10.2 g/dL — ABNORMAL LOW (ref 12.0–15.0)
Immature Granulocytes: 0 %
Lymphocytes Relative: 29 %
Lymphs Abs: 2.9 10*3/uL (ref 0.7–4.0)
MCH: 29.9 pg (ref 26.0–34.0)
MCHC: 36.2 g/dL — ABNORMAL HIGH (ref 30.0–36.0)
MCV: 82.7 fL (ref 80.0–100.0)
Monocytes Absolute: 1.2 10*3/uL — ABNORMAL HIGH (ref 0.1–1.0)
Monocytes Relative: 12 %
Neutro Abs: 5.8 10*3/uL (ref 1.7–7.7)
Neutrophils Relative %: 56 %
Platelets: 501 10*3/uL — ABNORMAL HIGH (ref 150–400)
RBC: 3.41 MIL/uL — ABNORMAL LOW (ref 3.87–5.11)
RDW: 13.5 % (ref 11.5–15.5)
WBC: 10.2 10*3/uL (ref 4.0–10.5)
nRBC: 0.2 % (ref 0.0–0.2)

## 2022-05-10 LAB — I-STAT BETA HCG BLOOD, ED (MC, WL, AP ONLY): I-stat hCG, quantitative: <5 m[IU]/mL (ref <5)

## 2022-05-10 LAB — RETICULOCYTES
Immature Retic Fract: 36.7 % — ABNORMAL HIGH (ref 2.3–15.9)
RBC.: 3.42 MIL/uL — ABNORMAL LOW (ref 3.87–5.11)
Retic Count, Absolute: 153.6 10*3/uL (ref 19.0–186.0)
Retic Ct Pct: 4.5 % — ABNORMAL HIGH (ref 0.4–3.1)

## 2022-05-10 LAB — BASIC METABOLIC PANEL
Anion gap: 6 (ref 5–15)
BUN: 10 mg/dL (ref 6–20)
CO2: 24 mmol/L (ref 22–32)
Calcium: 9.4 mg/dL (ref 8.9–10.3)
Chloride: 109 mmol/L (ref 98–111)
Creatinine, Ser: 0.7 mg/dL (ref 0.44–1.00)
GFR, Estimated: >60 mL/min (ref >60)
Glucose, Bld: 98 mg/dL (ref 70–99)
Potassium: 3.8 mmol/L (ref 3.5–5.1)
Sodium: 139 mmol/L (ref 135–145)

## 2022-05-10 MED ORDER — DIPHENHYDRAMINE HCL 25 MG PO CAPS: 25.0000 mg | ORAL_CAPSULE | ORAL | Status: DC | PRN

## 2022-05-10 MED ORDER — SODIUM CHLORIDE 0.45 % IV SOLN: INTRAVENOUS | Status: DC

## 2022-05-10 MED ORDER — CITALOPRAM HYDROBROMIDE 20 MG PO TABS
20.0000 mg | ORAL_TABLET | Freq: Every morning | ORAL | Status: DC
  Administered 2022-05-11 – 2022-05-12 (×2): 20 mg via ORAL
  Filled 2022-05-10 (×2): qty 1

## 2022-05-10 MED ORDER — HYDROMORPHONE 1 MG/ML IV SOLN
INTRAVENOUS | Status: DC
  Administered 2022-05-10: 7.2 mg via INTRAVENOUS
  Administered 2022-05-11: 30 mg via INTRAVENOUS
  Administered 2022-05-11: 2.5 mg via INTRAVENOUS
  Administered 2022-05-11: 10.2 mg via INTRAVENOUS
  Administered 2022-05-11: 4.8 mg via INTRAVENOUS
  Administered 2022-05-11: 1.8 mg via INTRAVENOUS
  Administered 2022-05-11: 0.01 mg via INTRAVENOUS
  Administered 2022-05-12: 4.2 mg via INTRAVENOUS
  Administered 2022-05-12: 5.4 mg via INTRAVENOUS
  Filled 2022-05-10 (×2): qty 30

## 2022-05-10 MED ORDER — SODIUM CHLORIDE 0.9% FLUSH: 9.0000 mL | INTRAVENOUS | Status: DC | PRN

## 2022-05-10 MED ORDER — ENOXAPARIN SODIUM 40 MG/0.4ML IJ SOSY
40.0000 mg | PREFILLED_SYRINGE | INTRAMUSCULAR | Status: DC
  Filled 2022-05-10: qty 0.4

## 2022-05-10 MED ORDER — DULOXETINE HCL 60 MG PO CPEP
60.0000 mg | ORAL_CAPSULE | Freq: Every day | ORAL | Status: DC
Start: 2022-05-10 — End: 2022-05-10

## 2022-05-10 MED ORDER — SODIUM CHLORIDE 0.9 % IV SOLN
12.5000 mg | Freq: Once | INTRAVENOUS | Status: AC
Start: 2022-05-10 — End: 2022-05-10
  Administered 2022-05-10: 12.5 mg via INTRAVENOUS
  Filled 2022-05-10: qty 12.5

## 2022-05-10 MED ORDER — HYDROMORPHONE HCL 2 MG/ML IJ SOLN
2.0000 mg | INTRAMUSCULAR | Status: AC
  Administered 2022-05-10: 2 mg via INTRAVENOUS
  Filled 2022-05-10: qty 1

## 2022-05-10 MED ORDER — NALOXONE HCL 0.4 MG/ML IJ SOLN: 0.4000 mg | INTRAMUSCULAR | Status: DC | PRN

## 2022-05-10 MED ORDER — POLYETHYLENE GLYCOL 3350 17 G PO PACK: 17.0000 g | PACK | Freq: Every day | ORAL | Status: DC | PRN

## 2022-05-10 MED ORDER — MORPHINE SULFATE ER 15 MG PO TBCR
60.0000 mg | EXTENDED_RELEASE_TABLET | Freq: Two times a day (BID) | ORAL | Status: DC
  Administered 2022-05-10 – 2022-05-12 (×4): 60 mg via ORAL
  Filled 2022-05-10 (×4): qty 4

## 2022-05-10 MED ORDER — ONDANSETRON HCL 4 MG/2ML IJ SOLN
4.0000 mg | INTRAMUSCULAR | Status: DC | PRN
  Administered 2022-05-11: 4 mg via INTRAVENOUS
  Filled 2022-05-10: qty 2

## 2022-05-10 MED ORDER — ORAL CARE MOUTH RINSE: 15.0000 mL | OROMUCOSAL | Status: DC | PRN

## 2022-05-10 MED ORDER — SENNOSIDES-DOCUSATE SODIUM 8.6-50 MG PO TABS
1.0000 | ORAL_TABLET | Freq: Two times a day (BID) | ORAL | Status: DC
  Administered 2022-05-10 – 2022-05-12 (×4): 1 via ORAL
  Filled 2022-05-10 (×4): qty 1

## 2022-05-10 MED ORDER — KETOROLAC TROMETHAMINE 15 MG/ML IJ SOLN
15.0000 mg | Freq: Four times a day (QID) | INTRAMUSCULAR | Status: DC
  Administered 2022-05-10 – 2022-05-12 (×6): 15 mg via INTRAVENOUS
  Filled 2022-05-10 (×6): qty 1

## 2022-05-10 MED ORDER — FOLIC ACID 1 MG PO TABS
1.0000 mg | ORAL_TABLET | Freq: Every day | ORAL | Status: DC
  Administered 2022-05-10 – 2022-05-12 (×3): 1 mg via ORAL
  Filled 2022-05-10 (×3): qty 1

## 2022-05-10 MED ORDER — CYCLOBENZAPRINE HCL 10 MG PO TABS: 10.0000 mg | ORAL_TABLET | Freq: Every day | ORAL | Status: DC | PRN

## 2022-05-10 MED ORDER — HYDROMORPHONE HCL 2 MG/ML IJ SOLN
2.0000 mg | INTRAMUSCULAR | Status: DC | PRN
  Administered 2022-05-10 (×3): 2 mg via INTRAVENOUS
  Filled 2022-05-10 (×3): qty 1

## 2022-05-10 NOTE — ED Triage Notes (Signed)
Pt arrived via POV, c/o SCC. Pain to back, legs. States she was caught in the rain Friday, believes this triggered the pain.

## 2022-05-10 NOTE — H&P (Signed)
H&P  Patient Demographics:  Carmen [name], is a 31 y.o. female  MRN: [id number]   DOB - September 26, 1991  Admit Date - 05/10/2022  Outpatient Primary MD for the patient is [name], FNP  Chief Complaint  Patient presents with   Sickle Cell Pain Crisis      HPI:   Carmen [name]  is a 31 y.o. female with a medical history significant for sickle cell disease, chronic pain syndrome, opiate dependence and tolerance, history of major depression, and history of anemia of chronic disease presents to the emergency department with complaints of low back and lower extremity pain.  Patient's pain is consistent with her typical sickle cell pain crisis.  She has not identified any provocative factors concerning crisis.  Patient says that she has been taking her home medications over the past several days without very much relief.  She rates her pain as 9/10, constant, and throbbing.  She denies any headache, shortness of breath, or chest pain.  No urinary symptoms, nausea, vomiting, or diarrhea.  No recent travel, or sick contacts.  While in ER: Patient's vital signs have remained stable.BP 109/71   Pulse 82   Temp 99.4 F (37.4 C) (Oral)   Resp 15   LMP 04/07/2022   SpO2 96%  Reviewed all laboratory values, largely consistent with patient's baseline and unremarkable.  Patient's pain persists despite IV fluids, IV Toradol, and IV Dilaudid.  Patient will be admitted to [place] for further management of her sickle cell pain crisis.   Review of systems:  In addition to the HPI above, patient reports No fever or chills No Headache, No changes with vision or hearing No problems swallowing food or liquids No chest pain, cough or shortness of breath No abdominal pain, No nausea or vomiting, Bowel movements are regular No blood in stool or urine No dysuria No new skin rashes or bruises No new joints pains-aches No new weakness, tingling, numbness in any extremity No recent weight gain or loss No  polyuria, polydypsia or polyphagia No significant Mental Stressors   With Past History of the following :   Past Medical History:  Diagnosis Date   Anxiety    Headache(784.0)    Heart murmur    Sickle cell crisis ([place])    Syphilis 2015   Was diagnosed and received one injection of antibiotics   Thrombocytosis 11/22/2014     CBC Latest Ref Rng & Units 12/13/2018 12/11/2018 12/10/2018 WBC 4.0 - 10.5 K/uL 14.8(H) 13.2(H) 15.9(H) Hemoglobin 12.0 - 15.0 g/dL 8.2(L) 7.7(L) 8.1(L) Hematocrit 36.0 - 46.0 % 23.7(L) 23.2(L) 24.5(L) Platelets 150 - 400 K/uL 326 399 388        Past Surgical History:  Procedure Laterality Date   CESAREAN SECTION N/A 02/05/2019   Procedure: CESAREAN SECTION;  Surgeon: [name], MD;  Location: [facility] LD ORS;  Service: Obstetrics;  Laterality: N/A;   CHOLECYSTECTOMY N/A 11/30/2014   Procedure: LAPAROSCOPIC CHOLECYSTECTOMY SINGLE SITE WITH INTRAOPERATIVE CHOLANGIOGRAM;  Surgeon: Michael Boston, MD;  Location: [facility] ORS;  Service: General;  Laterality: N/A;   SPLENECTOMY       Social History:   Social History   Tobacco Use   Smoking status: Some Days    Types: Cigarettes   Smokeless tobacco: Never  Substance Use Topics   Alcohol use: No     Lives - At home   Family History :   Family History  Problem Relation Age of Onset   Hypertension Mother    Sickle  cell anemia Sister    Kidney disease Sister        Lupus   Arthritis Sister    Sickle cell anemia Sister    Sickle cell trait Sister    Heart disease Maternal Aunt        CABG   Heart disease Maternal Uncle        CABG   Lupus Sister      Home Medications:   Prior to Admission medications   Medication Sig Start Date End Date Taking? Authorizing Provider  cyclobenzaprine (FLEXERIL) 10 MG tablet Take 1 tablet (10 mg total) by mouth daily as needed for muscle spasms. 04/07/22  Yes [name], FNP  folic acid (FOLVITE) 1 MG tablet Take 1 tablet (1 mg total) by mouth daily. 03/15/19  Yes Lanae Boast, FNP  morphine (MS CONTIN) 60 MG 12 hr tablet Take 1 tablet (60 mg total) by mouth every 12 (twelve) hours. Patient taking differently: Take 60 mg by mouth every 12 (twelve) hours as needed for pain. 05/13/22  Yes [name], FNP  Oxycodone HCl 10 MG TABS Take 1 tablet (10 mg total) by mouth every 6 (six) hours as needed (pain). 05/07/22  Yes [name], FNP  Vitamin D, Ergocalciferol, (DRISDOL) 1.25 MG (50000 UNIT) CAPS capsule TAKE ONE CAPSULE BY MOUTH ONE TIME PER WEEK Patient taking differently: Take 50,000 Units by mouth every Wednesday. 09/03/20  Yes King, Diona Foley, NP  betamethasone valerate ointment (VALISONE) 0.1 % APPLY TO AFFECTED AREA TWICE A DAY Patient not taking: Reported on 05/10/2022 04/07/22   [name], FNP  citalopram (CELEXA) 20 MG tablet Take 1 tablet (20 mg total) by mouth every morning. Patient not taking: Reported on 05/10/2022 04/07/22   [name], FNP  DULoxetine (CYMBALTA) 30 MG capsule Take 2 capsules (60 mg total) by mouth daily. Patient not taking: Reported on 05/10/2022 02/04/22 08/03/22  [name], FNP     Allergies:   Allergies  Allergen Reactions   Ketamine Hives and Other (See Comments)    Makes feel funny     Physical Exam:   Vitals:   Vitals:   05/10/22 1315 05/10/22 1330  BP: 110/80 109/71  Pulse: 76 82  Resp: 17 15  Temp:    SpO2: 96% 96%    Physical Exam: Constitutional: Patient appears well-developed and well-nourished. Not in obvious distress. HENT: Normocephalic, atraumatic, External right and left ear normal. Oropharynx is clear and moist.  Eyes: Conjunctivae and EOM are normal. PERRLA, no scleral icterus. Neck: Normal ROM. Neck supple. No JVD. No tracheal deviation. No thyromegaly. CVS: RRR, S1/S2 +, no murmurs, no gallops, no carotid bruit.  Pulmonary: Effort and breath sounds normal, no stridor, rhonchi, wheezes, rales.  Abdominal: Soft. BS +, no distension, tenderness, rebound or guarding.   Musculoskeletal: Normal range of motion. No edema and no tenderness.  Lymphadenopathy: No lymphadenopathy noted, cervical, inguinal or axillary Neuro: Alert. Normal reflexes, muscle tone coordination. No cranial nerve deficit. Skin: Skin is warm and dry. No rash noted. Not diaphoretic. No erythema. No pallor. Psychiatric: Normal mood and affect. Behavior, judgment, thought content normal.   Data Review:   CBC Recent Labs  Lab 05/10/22 1048  WBC 10.2  HGB 10.2*  HCT 28.2*  PLT 501*  MCV 82.7  MCH 29.9  MCHC 36.2*  RDW 13.5  LYMPHSABS 2.9  MONOABS 1.2*  EOSABS 0.3  BASOSABS 0.0   ------------------------------------------------------------------------------------------------------------------  Chemistries  Recent Labs  Lab  05/10/22 1048  NA 139  K 3.8  CL 109  CO2 24  GLUCOSE 98  BUN 10  CREATININE 0.70  CALCIUM 9.4   ------------------------------------------------------------------------------------------------------------------ CrCl cannot be calculated (Unknown ideal weight.). ------------------------------------------------------------------------------------------------------------------ No results for input(s): "TSH", "T4TOTAL", "T3FREE", "THYROIDAB" in the last 72 hours.  Invalid input(s): "FREET3"  Coagulation profile No results for input(s): "INR", "PROTIME" in the last 168 hours. ------------------------------------------------------------------------------------------------------------------- No results for input(s): "DDIMER" in the last 72 hours. -------------------------------------------------------------------------------------------------------------------  Cardiac Enzymes No results for input(s): "CKMB", "TROPONINI", "MYOGLOBIN" in the last 168 hours.  Invalid input(s): "CK" ------------------------------------------------------------------------------------------------------------------ No results found for:  "BNP"  ---------------------------------------------------------------------------------------------------------------  Urinalysis    Component Value Date/Time   COLORURINE YELLOW 03/30/2022 0301   APPEARANCEUR CLEAR 03/30/2022 0301   LABSPEC 1.005 03/30/2022 0301   PHURINE 6.0 03/30/2022 0301   GLUCOSEU NEGATIVE 03/30/2022 0301   HGBUR NEGATIVE 03/30/2022 0301   BILIRUBINUR NEGATIVE 03/30/2022 0301   BILIRUBINUR negative 08/26/2021 1527   BILIRUBINUR Negative 08/10/2019 1534   KETONESUR NEGATIVE 03/30/2022 0301   PROTEINUR NEGATIVE 03/30/2022 0301   UROBILINOGEN 1.0 08/26/2021 1527   UROBILINOGEN 1.0 01/12/2018 0943   NITRITE NEGATIVE 03/30/2022 0301   LEUKOCYTESUR NEGATIVE 03/30/2022 0301    ----------------------------------------------------------------------------------------------------------------   Imaging Results:    No results found.   Assessment & Plan:  Principal Problem:   Sickle cell pain crisis ([place]) Active Problems:   Major depressive disorder   Chronic pain syndrome  Sickle cell disease with pain crisis: Admit to MedSurg.  Initiate IV Dilaudid PCA Toradol 15 mg IV every 6 hours Hold oxycodone, use PCA.  We will reintroduce oxycodone as pain intensity improves. Continue MS Contin 60 mg every 12 hours IV fluids, 0.45% saline at 50 mL/h Monitor vital signs very closely, reevaluate pain scale regularly, and supplemental oxygen as needed Patient will be reevaluated for pain in the context of function and relationship to baseline as care progresses.  Chronic pain syndrome: Continue home medications  Anemia of chronic disease: Hemoglobin is stable and consistent with patient's baseline.  There is no clinical indication for blood transfusion at this time.  Monitor closely.  History of depression: Continue Cymbalta Cymbalta.  No suicidal homicidal intentions.  Monitor closely.    DVT Prophylaxis: Subcut Lovenox   AM Labs Ordered, also please review  Full Orders  Family Communication: Admission, patient's condition and plan of care including tests being ordered have been discussed with the patient who indicate understanding and agree with the plan and Code Status.  Code Status: Full Code  Consults called: None    Admission status: Inpatient    Time spent in minutes : 30 minutes [place], MSN, FNP-C Patient [facility] [address] [place], [place] [address] [phone]  05/10/2022 at 3:49 PM
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
## 2022-05-10 NOTE — Telephone Encounter (Signed)
No additional notes

## 2022-05-11 DIAGNOSIS — D5700 Hb-SS disease with crisis, unspecified: Secondary | ICD-10-CM | POA: Diagnosis not present

## 2022-05-11 LAB — CBC
HCT: 26.4 % — ABNORMAL LOW (ref 36.0–46.0)
Hemoglobin: 9.3 g/dL — ABNORMAL LOW (ref 12.0–15.0)
MCH: 29.4 pg (ref 26.0–34.0)
MCHC: 35.2 g/dL (ref 30.0–36.0)
MCV: 83.5 fL (ref 80.0–100.0)
Platelets: 418 10*3/uL — ABNORMAL HIGH (ref 150–400)
RBC: 3.16 MIL/uL — ABNORMAL LOW (ref 3.87–5.11)
RDW: 13.6 % (ref 11.5–15.5)
WBC: 9.8 10*3/uL (ref 4.0–10.5)
nRBC: 0.3 % — ABNORMAL HIGH (ref 0.0–0.2)

## 2022-05-11 MED ORDER — PROCHLORPERAZINE EDISYLATE 10 MG/2ML IJ SOLN
10.0000 mg | Freq: Four times a day (QID) | INTRAMUSCULAR | Status: DC | PRN
  Administered 2022-05-11: 10 mg via INTRAVENOUS
  Filled 2022-05-11: qty 2

## 2022-05-11 NOTE — Progress Notes (Signed)
Subjective: Carmen [name] is a 31 year old female with a medical history significant for sickle cell disease, chronic pain syndrome, opiate dependence ant tolerance, history of depression and history of anemia of chronic disease was admitted for sickle cell pain crisis.  Patient continues to complain of pain to low back and lower extremities. Pain intensity is 9/10.  She denies any chest pain, shortness of breath, headache, constipation or diarrhea. Patient endorses nausea.   Objective:  Vital signs in last 24 hours:  Vitals:   05/10/22 2315 05/11/22 0503 05/11/22 0700 05/11/22 1000  BP: 114/76  122/68   Pulse: 72  64   Resp: '14 16 18 17  '$ Temp: 98.3 F (36.8 C)  97.9 F (36.6 C)   TempSrc: Oral  Oral   SpO2: 100% 98% 95% 92%  Weight:      Height:        Intake/Output from previous day:   Intake/Output Summary (Last 24 hours) at 05/11/2022 1306 Last data filed at 05/11/2022 3614 Gross per 24 hour  Intake 569.59 ml  Output --  Net 569.59 ml    Physical Exam: General: Alert, awake, oriented x3, in no acute distress.  HEENT: [place]/AT PEERL, EOMI Neck: Trachea midline,  no masses, no thyromegal,y no JVD, no carotid bruit OROPHARYNX:  Moist, No exudate/ erythema/lesions.  Heart: Regular rate and rhythm, without murmurs, rubs, gallops, PMI non-displaced, no heaves or thrills on palpation.  Lungs: Clear to auscultation, no wheezing or rhonchi noted. No increased vocal fremitus resonant to percussion  Abdomen: Soft, nontender, nondistended, positive bowel sounds, no masses no hepatosplenomegaly noted..  Neuro: No focal neurological deficits noted cranial nerves II through XII grossly intact. DTRs 2+ bilaterally upper and lower extremities. Strength 5 out of 5 in bilateral upper and lower extremities. Musculoskeletal: No warm swelling or erythema around joints, no spinal tenderness noted. Psychiatric: Patient alert and oriented x3, good insight and cognition, good recent to remote  recall. Lymph node survey: No cervical axillary or inguinal lymphadenopathy noted.  Lab Results:  Basic Metabolic Panel:    Component Value Date/Time   NA 139 05/10/2022 1048   NA 142 07/02/2020 1040   K 3.8 05/10/2022 1048   CL 109 05/10/2022 1048   CO2 24 05/10/2022 1048   BUN 10 05/10/2022 1048   BUN 8 07/02/2020 1040   CREATININE 0.70 05/10/2022 1048   GLUCOSE 98 05/10/2022 1048   CALCIUM 9.4 05/10/2022 1048   CBC:    Component Value Date/Time   WBC 9.8 05/11/2022 0614   HGB 9.3 (L) 05/11/2022 0614   HGB 9.8 (L) 07/02/2020 1040   HCT 26.4 (L) 05/11/2022 0614   HCT 28.9 (L) 07/02/2020 1040   PLT 418 (H) 05/11/2022 0614   PLT 628 (H) 07/02/2020 1040   MCV 83.5 05/11/2022 0614   MCV 88 07/02/2020 1040   NEUTROABS 5.8 05/10/2022 1048   NEUTROABS 3.0 07/02/2020 1040   LYMPHSABS 2.9 05/10/2022 1048   LYMPHSABS 3.5 (H) 07/02/2020 1040   MONOABS 1.2 (H) 05/10/2022 1048   EOSABS 0.3 05/10/2022 1048   EOSABS 0.2 07/02/2020 1040   BASOSABS 0.0 05/10/2022 1048   BASOSABS 0.1 07/02/2020 1040    No results found for this or any previous visit (from the past 240 hour(s)).  Studies/Results: No results found.  Medications: Scheduled Meds:  citalopram  20 mg Oral q morning   enoxaparin (LOVENOX) injection  40 mg Subcutaneous E31V   folic acid  1 mg Oral Daily   HYDROmorphone  Intravenous Q4H   ketorolac  15 mg Intravenous Q6H   morphine  60 mg Oral Q12H   senna-docusate  1 tablet Oral BID   Continuous Infusions:  sodium chloride Stopped (05/10/22 1849)   PRN Meds:.cyclobenzaprine, diphenhydrAMINE, naloxone **AND** sodium chloride flush, ondansetron, mouth rinse, polyethylene glycol, prochlorperazine  Consultants: none  Procedures: none  Antibiotics: none  Assessment/Plan: Principal Problem:   Sickle cell pain crisis ([place]) Active Problems:   Major depressive disorder   Chronic pain syndrome  Sickle cell pain crisis:  Continue IV dilaudid PCA without  changes Continue home medications 0.45% saline at 50 ml/hr Toradol 15 mg every 6 hours Monitor vital signs very closely, reevaluate pain scale regularly, and supplemental oxygen as needed.   Chronic syndrome:  Continue home medications  Anemia of chronic disease:  Hemoglobin is stable and consistent with patient's baseline. There in no clinical indication for blood transfusion at this time. Continue to monitor closely  History of major depression:  Continue Cymbalta. No suicidal homicidal intentions. Monitor closely.   Code Status: Full Code Family Communication: N/A Disposition Plan: Not yet ready for discharge  [place], MSN, FNP-C Patient [facility] [address] [place], [place] [address] [phone]  If 7PM-7AM, please contact night-coverage.  05/11/2022, 1:06 PM  LOS: 1 day

## 2022-05-12 DIAGNOSIS — D5700 Hb-SS disease with crisis, unspecified: Secondary | ICD-10-CM | POA: Diagnosis not present

## 2022-05-12 LAB — CBC
HCT: 26.4 % — ABNORMAL LOW (ref 36.0–46.0)
Hemoglobin: 9.3 g/dL — ABNORMAL LOW (ref 12.0–15.0)
MCH: 29.1 pg (ref 26.0–34.0)
MCHC: 35.2 g/dL (ref 30.0–36.0)
MCV: 82.5 fL (ref 80.0–100.0)
Platelets: 446 10*3/uL — ABNORMAL HIGH (ref 150–400)
RBC: 3.2 MIL/uL — ABNORMAL LOW (ref 3.87–5.11)
RDW: 13.6 % (ref 11.5–15.5)
WBC: 8.4 10*3/uL (ref 4.0–10.5)
nRBC: 0.2 % (ref 0.0–0.2)

## 2022-05-12 LAB — BASIC METABOLIC PANEL
Anion gap: 8 (ref 5–15)
BUN: 9 mg/dL (ref 6–20)
CO2: 24 mmol/L (ref 22–32)
Calcium: 9.2 mg/dL (ref 8.9–10.3)
Chloride: 107 mmol/L (ref 98–111)
Creatinine, Ser: 0.42 mg/dL — ABNORMAL LOW (ref 0.44–1.00)
GFR, Estimated: >60 mL/min (ref >60)
Glucose, Bld: 97 mg/dL (ref 70–99)
Potassium: 3.8 mmol/L (ref 3.5–5.1)
Sodium: 139 mmol/L (ref 135–145)

## 2022-05-12 NOTE — Telephone Encounter (Signed)
Thank you :)

## 2022-05-13 ENCOUNTER — Telehealth: Payer: Self-pay

## 2022-05-13 NOTE — Telephone Encounter (Signed)
Prior Authorization started for morphine (MS CONTIN) 60 MG 12 hr tablet waiting for insurance approval confirmation# [id number] W

## 2022-05-13 NOTE — Telephone Encounter (Signed)
Pt called and wants to know about her PA and has questions about a previous PA

## 2022-05-14 ENCOUNTER — Telehealth: Payer: Self-pay

## 2022-05-14 ENCOUNTER — Emergency Department (HOSPITAL_COMMUNITY)
Admission: EM | Admit: 2022-05-14 | Discharge: 2022-05-14 | Disposition: A | Discharge status: Home / Self Care | Payer: Medicaid Other | Source: Home / Self Care | Attending: Emergency Medicine | Admitting: Emergency Medicine

## 2022-05-14 ENCOUNTER — Other Ambulatory Visit: Payer: Self-pay

## 2022-05-14 ENCOUNTER — Encounter (HOSPITAL_COMMUNITY): Payer: Self-pay

## 2022-05-14 ENCOUNTER — Inpatient Hospital Stay (HOSPITAL_COMMUNITY)
Admission: AD | Admit: 2022-05-14 | Discharge: 2022-05-18 | DRG: 812 | Disposition: A | Discharge status: Home / Self Care | Payer: Medicaid Other | Source: Ambulatory Visit | Attending: Internal Medicine | Admitting: Internal Medicine

## 2022-05-14 DIAGNOSIS — R079 Chest pain, unspecified: Secondary | ICD-10-CM | POA: Diagnosis present

## 2022-05-14 DIAGNOSIS — F419 Anxiety disorder, unspecified: Secondary | ICD-10-CM | POA: Diagnosis present

## 2022-05-14 DIAGNOSIS — Z8249 Family history of ischemic heart disease and other diseases of the circulatory system: Secondary | ICD-10-CM

## 2022-05-14 DIAGNOSIS — Z832 Family history of diseases of the blood and blood-forming organs and certain disorders involving the immune mechanism: Secondary | ICD-10-CM

## 2022-05-14 DIAGNOSIS — D5700 Hb-SS disease with crisis, unspecified: Secondary | ICD-10-CM

## 2022-05-14 DIAGNOSIS — Z8261 Family history of arthritis: Secondary | ICD-10-CM

## 2022-05-14 DIAGNOSIS — D638 Anemia in other chronic diseases classified elsewhere: Secondary | ICD-10-CM | POA: Diagnosis present

## 2022-05-14 DIAGNOSIS — F3130 Bipolar disorder, current episode depressed, mild or moderate severity, unspecified: Secondary | ICD-10-CM | POA: Diagnosis present

## 2022-05-14 DIAGNOSIS — F17210 Nicotine dependence, cigarettes, uncomplicated: Secondary | ICD-10-CM | POA: Diagnosis present

## 2022-05-14 DIAGNOSIS — G894 Chronic pain syndrome: Secondary | ICD-10-CM | POA: Diagnosis present

## 2022-05-14 DIAGNOSIS — Z888 Allergy status to other drugs, medicaments and biological substances status: Secondary | ICD-10-CM

## 2022-05-14 DIAGNOSIS — Z841 Family history of disorders of kidney and ureter: Secondary | ICD-10-CM

## 2022-05-14 DIAGNOSIS — F1120 Opioid dependence, uncomplicated: Secondary | ICD-10-CM | POA: Diagnosis present

## 2022-05-14 DIAGNOSIS — R0789 Other chest pain: Secondary | ICD-10-CM | POA: Diagnosis present

## 2022-05-14 DIAGNOSIS — Z79899 Other long term (current) drug therapy: Secondary | ICD-10-CM

## 2022-05-14 LAB — CBC
HCT: 29.4 % — ABNORMAL LOW (ref 36.0–46.0)
Hemoglobin: 10.2 g/dL — ABNORMAL LOW (ref 12.0–15.0)
MCH: 28.9 pg (ref 26.0–34.0)
MCHC: 34.7 g/dL (ref 30.0–36.0)
MCV: 83.3 fL (ref 80.0–100.0)
Platelets: 520 10*3/uL — ABNORMAL HIGH (ref 150–400)
RBC: 3.53 MIL/uL — ABNORMAL LOW (ref 3.87–5.11)
RDW: 13.6 % (ref 11.5–15.5)
WBC: 7.7 10*3/uL (ref 4.0–10.5)
nRBC: 0.5 % — ABNORMAL HIGH (ref 0.0–0.2)

## 2022-05-14 MED ORDER — NALOXONE HCL 0.4 MG/ML IJ SOLN: 0.4000 mg | INTRAMUSCULAR | Status: DC | PRN

## 2022-05-14 MED ORDER — ENOXAPARIN SODIUM 40 MG/0.4ML IJ SOSY
40.0000 mg | PREFILLED_SYRINGE | Freq: Every day | INTRAMUSCULAR | Status: DC
  Filled 2022-05-14 (×2): qty 0.4

## 2022-05-14 MED ORDER — KETOROLAC TROMETHAMINE 15 MG/ML IJ SOLN
15.0000 mg | Freq: Four times a day (QID) | INTRAMUSCULAR | Status: DC
  Administered 2022-05-14 – 2022-05-18 (×16): 15 mg via INTRAVENOUS
  Filled 2022-05-14 (×16): qty 1

## 2022-05-14 MED ORDER — DULOXETINE HCL 60 MG PO CPEP
60.0000 mg | ORAL_CAPSULE | Freq: Every day | ORAL | Status: DC
Start: 2022-05-14 — End: 2022-05-14

## 2022-05-14 MED ORDER — FOLIC ACID 1 MG PO TABS
1.0000 mg | ORAL_TABLET | Freq: Every day | ORAL | Status: DC
  Administered 2022-05-14 – 2022-05-18 (×5): 1 mg via ORAL
  Filled 2022-05-14 (×5): qty 1

## 2022-05-14 MED ORDER — LACTATED RINGERS IV BOLUS: 500.0000 mL | Freq: Once | INTRAVENOUS | Status: DC

## 2022-05-14 MED ORDER — HYDROMORPHONE 1 MG/ML IV SOLN
INTRAVENOUS | Status: DC
  Administered 2022-05-14: 30 mg via INTRAVENOUS
  Administered 2022-05-14: 8.5 mg via INTRAVENOUS
  Administered 2022-05-14: 6 mg via INTRAVENOUS
  Administered 2022-05-14: 3.5 mg via INTRAVENOUS
  Administered 2022-05-15: 30 mg via INTRAVENOUS
  Administered 2022-05-15 (×2): 4.5 mg via INTRAVENOUS
  Administered 2022-05-15: 1.5 mg via INTRAVENOUS
  Administered 2022-05-15: 4.5 mg via INTRAVENOUS
  Administered 2022-05-15: 8.5 mg via INTRAVENOUS
  Administered 2022-05-16: 10.5 mg via INTRAVENOUS
  Administered 2022-05-16: 5 mg via INTRAVENOUS
  Administered 2022-05-16: 30 mg via INTRAVENOUS
  Administered 2022-05-16: 1.5 mg via INTRAVENOUS
  Administered 2022-05-16: 7 mg via INTRAVENOUS
  Administered 2022-05-16: 5 mg via INTRAVENOUS
  Administered 2022-05-17: 30 mg via INTRAVENOUS
  Administered 2022-05-17: 2.5 mg via INTRAVENOUS
  Administered 2022-05-17: 13.19 mg via INTRAVENOUS
  Filled 2022-05-14 (×4): qty 30

## 2022-05-14 MED ORDER — SODIUM CHLORIDE 0.45 % IV SOLN: INTRAVENOUS | Status: DC

## 2022-05-14 MED ORDER — ONDANSETRON HCL 4 MG/2ML IJ SOLN: 4.0000 mg | Freq: Four times a day (QID) | INTRAMUSCULAR | Status: DC | PRN

## 2022-05-14 MED ORDER — ACETAMINOPHEN 500 MG PO TABS
1000.0000 mg | ORAL_TABLET | Freq: Once | ORAL | Status: AC
  Administered 2022-05-14: 1000 mg via ORAL
  Filled 2022-05-14: qty 2

## 2022-05-14 MED ORDER — MORPHINE SULFATE ER 15 MG PO TBCR
60.0000 mg | EXTENDED_RELEASE_TABLET | Freq: Two times a day (BID) | ORAL | Status: DC
  Administered 2022-05-14 – 2022-05-18 (×8): 60 mg via ORAL
  Filled 2022-05-14 (×8): qty 4

## 2022-05-14 MED ORDER — DIPHENHYDRAMINE HCL 25 MG PO CAPS: 25.0000 mg | ORAL_CAPSULE | ORAL | Status: DC | PRN

## 2022-05-14 MED ORDER — CITALOPRAM HYDROBROMIDE 20 MG PO TABS: 20.0000 mg | ORAL_TABLET | Freq: Every morning | ORAL | Status: DC

## 2022-05-14 MED ORDER — SODIUM CHLORIDE 0.9% FLUSH: 9.0000 mL | INTRAVENOUS | Status: DC | PRN

## 2022-05-14 MED ORDER — KETOROLAC TROMETHAMINE 15 MG/ML IJ SOLN: 15.0000 mg | INTRAMUSCULAR | Status: DC

## 2022-05-14 NOTE — Telephone Encounter (Signed)
Prior Authorization for morphine (MS CONTIN) 60 MG 12 hr tablet approved   [id number] W  Benefit Plan:MCAIDHealth Plan:[id number] Prior Approval #:[id number] PA   Type:PHARMACYRecipient:Carmen R SIMMONSRecipient IO:[id number] OBilling Provider:Billing Provider LA:[id number] Provider Name:THE [place] Provider ZY:[id number] Submission Date:07/13/2023Status:APPROVEDEffective Begin Date:07/13/2023Effective End Date:01/09/2024Payer:[place] [employer] HEALTH BENEFITS

## 2022-05-14 NOTE — ED Provider Triage Note (Signed)
Emergency Medicine Provider Triage Evaluation Note  MIQUEL LAMSON , a 31 y.o. female  was evaluated in triage.  Pt complains of sickle cell pain.  Review of Systems  Positive: Chest pain back pain extremity pain Negative: No shortness of breath fever chills  Physical Exam  BP 123/81 (BP Location: Right Arm)   Pulse 99   Temp 99.9 F (37.7 C) (Oral)   Resp 18   Ht 1.6 m ('5\' 3"'$ )   Wt 83.9 kg   LMP 04/20/2022 (Approximate)   SpO2 97%   BMI 32.77 kg/m  Gen:   Awake, no distress   Resp:  Normal effort  MSK:   Moves extremities without difficulty    Medical Decision Making  Medically screening exam initiated at 10:51 AM.  Appropriate orders placed.  NYAJAH HYSON was informed that the remainder of the evaluation will be completed by another provider, this initial triage assessment does not replace that evaluation, and the importance of remaining in the ED until their evaluation is complete.     Pattricia Boss, MD 05/14/22 1055

## 2022-05-14 NOTE — Discharge Instructions (Signed)
Please go directly to sickle cell clinic

## 2022-05-14 NOTE — ED Triage Notes (Signed)
Patient c/o sickle cell pain in the upper, mid, and lower back, chest, and bilateral lower extremities x 4 days. Patient states that she has not had any pain meds since last night.

## 2022-05-14 NOTE — Telephone Encounter (Signed)
PA for Morphine ER  '60mg'$  confirmation [id number] W

## 2022-05-14 NOTE — Progress Notes (Signed)
Patient admitted [facility] the day infusion hospital from [facility] for sickle cell pain. Initially, patient reported bilateral leg, back and chest pain rated 10/10. For pain management, patient placed on Dilaudid PCA, given Tylenol and hydrated with IV fluids. Patient's chest pain decreased to 7/10 but patient reported that back and leg pain were still 9/10. Provider notified and orders placed for admission to inpatient unit for continued pain management. Report called to Estill Cotta, RN on [address]. Vital signs wnl. Patient transferred to [place] in wheelchair on PCA (settings 0.5/10/3). Patient alert, oriented and stable at transfer.

## 2022-05-14 NOTE — H&P (Addendum)
H&P  Patient Demographics:  Carmen [name], is a 31 y.o. female  MRN: [id number]   DOB - [birth year]  Admit Date - 05/14/2022  Outpatient Primary MD for the patient is [name], FNP  No chief complaint on file.     HPI:   Carmen [name]  is a 31 y.o. female    Review of systems:  In addition to the HPI above, patient reports No fever or chills No Headache, No changes with vision or hearing No problems swallowing food or liquids No chest pain, cough or shortness of breath No abdominal pain, No nausea or vomiting, Bowel movements are regular No blood in stool or urine No dysuria No new skin rashes or bruises No new joints pains-aches No new weakness, tingling, numbness in any extremity No recent weight gain or loss No polyuria, polydypsia or polyphagia No significant Mental Stressors  A full 10 point Review of Systems was done, except as stated above, all other Review of Systems were negative.  With Past History of the following :   Past Medical History:  Diagnosis Date   Anxiety    Headache(784.0)    Heart murmur    Sickle cell crisis ([place])    Syphilis 2015   Was diagnosed and received one injection of antibiotics   Thrombocytosis 11/22/2014     CBC Latest Ref Rng & Units 12/13/2018 12/11/2018 12/10/2018 WBC 4.0 - 10.5 K/uL 14.8(H) 13.2(H) 15.9(H) Hemoglobin 12.0 - 15.0 g/dL 8.2(L) 7.7(L) 8.1(L) Hematocrit 36.0 - 46.0 % 23.7(L) 23.2(L) 24.5(L) Platelets 150 - 400 K/uL 326 399 388        Past Surgical History:  Procedure Laterality Date   CESAREAN SECTION N/A 02/05/2019   Procedure: CESAREAN SECTION;  Surgeon: [name], MD;  Location: [facility] LD [facility]S;  Service: Obstetrics;  Laterality: N/A;   CHOLECYSTECTOMY N/A 11/30/2014   Procedure: LAPAROSCOPIC CHOLECYSTECTOMY SINGLE SITE WITH INTRAOPERATIVE CHOLANGIOGRAM;  Surgeon: Michael Boston, MD;  Location: [facility]S;  Service: General;  Laterality: N/A;   SPLENECTOMY       Social History:   Social History   Tobacco Use    Smoking status: Some Days    Types: Cigarettes   Smokeless tobacco: Never  Substance Use Topics   Alcohol use: No     Lives - At home   Family History :   Family History  Problem Relation Age of Onset   Hypertension Mother    Sickle cell anemia Sister    Kidney disease Sister        Lupus   Arthritis Sister    Sickle cell anemia Sister    Sickle cell trait Sister    Heart disease Maternal Aunt        CABG   Heart disease Maternal Uncle        CABG   Lupus Sister      Home Medications:   Prior to Admission medications   Medication Sig Start Date End Date Taking? Authorizing Provider  cyclobenzaprine (FLEXERIL) 10 MG tablet Take 1 tablet (10 mg total) by mouth daily as needed for muscle spasms. 04/07/22  Yes [name], FNP  folic acid (FOLVITE) 1 MG tablet Take 1 tablet (1 mg total) by mouth daily. 03/15/19  Yes Lanae Boast, FNP  morphine (MS CONTIN) 60 MG 12 hr tablet Take 1 tablet (60 mg total) by mouth every 12 (twelve) hours. Patient taking differently: Take 60 mg by mouth every 12 (twelve) hours as needed for pain. 05/13/22  Yes  [name], FNP  Oxycodone HCl 10 MG TABS Take 1 tablet (10 mg total) by mouth every 6 (six) hours as needed (pain). 05/07/22  Yes [name], FNP  Vitamin D, Ergocalciferol, (DRISDOL) 1.25 MG (50000 UNIT) CAPS capsule TAKE ONE CAPSULE BY MOUTH ONE TIME PER WEEK Patient taking differently: Take 50,000 Units by mouth every Wednesday. 09/03/20  Yes King, Diona Foley, NP  betamethasone valerate ointment (VALISONE) 0.1 % APPLY TO AFFECTED AREA TWICE A DAY Patient not taking: Reported on 05/10/2022 04/07/22   [name], FNP  citalopram (CELEXA) 20 MG tablet Take 1 tablet (20 mg total) by mouth every morning. Patient not taking: Reported on 05/10/2022 04/07/22   [name], FNP  DULoxetine (CYMBALTA) 30 MG capsule Take 2 capsules (60 mg total) by mouth daily. Patient not taking: Reported on 05/10/2022 02/04/22 08/03/22  [name], FNP     Allergies:   Allergies  Allergen Reactions   Ketamine Hives and Other (See Comments)    Makes feel funny     Physical Exam:   Vitals:   Vitals:   05/14/22 1305 05/14/22 1406  BP:  126/84  Pulse:  75  Resp:  18  Temp:  98.5 F (36.9 C)  SpO2: 100% 98%    Physical Exam: Constitutional: Patient appears well-developed and well-nourished. Not in obvious distress. HENT: Normocephalic, atraumatic, External right and left ear normal. Oropharynx is clear and moist.  Eyes: Conjunctivae and EOM are normal. PERRLA, no scleral icterus. Neck: Normal ROM. Neck supple. No JVD. No tracheal deviation. No thyromegaly. CVS: RRR, S1/S2 +, no murmurs, no gallops, no carotid bruit.  Pulmonary: Effort and breath sounds normal, no stridor, rhonchi, wheezes, rales.  Abdominal: Soft. BS +, no distension, tenderness, rebound or guarding.  Musculoskeletal: Normal range of motion. No edema and no tenderness.  Lymphadenopathy: No lymphadenopathy noted, cervical, inguinal or axillary Neuro: Alert. Normal reflexes, muscle tone coordination. No cranial nerve deficit. Skin: Skin is warm and dry. No rash noted. Not diaphoretic. No erythema. No pallor. Psychiatric: Normal mood and affect. Behavior, judgment, thought content normal.   Data Review:   CBC Recent Labs  Lab 05/10/22 1048 05/11/22 0614 05/12/22 0724 05/14/22 1235  WBC 10.2 9.8 8.4 7.7  HGB 10.2* 9.3* 9.3* 10.2*  HCT 28.2* 26.4* 26.4* 29.4*  PLT 501* 418* 446* 520*  MCV 82.7 83.5 82.5 83.3  MCH 29.9 29.4 29.1 28.9  MCHC 36.2* 35.2 35.2 34.7  RDW 13.5 13.6 13.6 13.6  LYMPHSABS 2.9  --   --   --   MONOABS 1.2*  --   --   --   EOSABS 0.3  --   --   --   BASOSABS 0.0  --   --   --    ------------------------------------------------------------------------------------------------------------------  Chemistries  Recent Labs  Lab 05/10/22 1048 05/12/22 0724  NA 139 139  K 3.8 3.8  CL 109 107  CO2 24 24   GLUCOSE 98 97  BUN 10 9  CREATININE 0.70 0.42*  CALCIUM 9.4 9.2   ------------------------------------------------------------------------------------------------------------------ estimated creatinine clearance is 106.5 mL/min (A) (by C-G formula based on SCr of 0.42 mg/dL (L)). ------------------------------------------------------------------------------------------------------------------ No results for input(s): "TSH", "T4TOTAL", "T3FREE", "THYROIDAB" in the last 72 hours.  Invalid input(s): "FREET3"  Coagulation profile No results for input(s): "INR", "PROTIME" in the last 168 hours. ------------------------------------------------------------------------------------------------------------------- No results for input(s): "DDIMER" in the last 72 hours. -------------------------------------------------------------------------------------------------------------------  Cardiac Enzymes No results for input(s): "CKMB", "TROPONINI", "MYOGLOBIN" in the  last 168 hours.  Invalid input(s): "CK" ------------------------------------------------------------------------------------------------------------------ No results found for: "BNP"  ---------------------------------------------------------------------------------------------------------------  Urinalysis    Component Value Date/Time   COLORURINE YELLOW 03/30/2022 0301   APPEARANCEUR CLEAR 03/30/2022 0301   LABSPEC 1.005 03/30/2022 0301   PHURINE 6.0 03/30/2022 0301   GLUCOSEU NEGATIVE 03/30/2022 0301   HGBUR NEGATIVE 03/30/2022 0301   BILIRUBINUR NEGATIVE 03/30/2022 0301   BILIRUBINUR negative 08/26/2021 1527   BILIRUBINUR Negative 08/10/2019 1534   KETONESUR NEGATIVE 03/30/2022 0301   PROTEINUR NEGATIVE 03/30/2022 0301   UROBILINOGEN 1.0 08/26/2021 1527   UROBILINOGEN 1.0 01/12/2018 0943   NITRITE NEGATIVE 03/30/2022 0301   LEUKOCYTESUR NEGATIVE 03/30/2022 0301     ----------------------------------------------------------------------------------------------------------------   Imaging Results:    No results found.   Assessment & Plan:  Principal Problem:   Sickle cell pain crisis ([facility]) Active Problems:   Chest pain   Anemia of chronic disease   Chronic pain syndrome   Hb Sickle Cell Disease with crisis: Admit patient, start IVF D5 .45% Saline @ 125 mls/hour, start weight based Dilaudid PCA, start IV Toradol 15 mg Q 6 H, Restart oral home pain medications, Monitor vitals very closely, Re-evaluate pain scale regularly, 2 L of Oxygen by [place], Patient will be re-evaluated for pain in the context of function and relationship to baseline as care progresses. Leukocytosis:  Anemia of Chronic Disease:  Chronic pain Syndrome:    DVT Prophylaxis: Subcut Lovenox   AM Labs Ordered, also please review Full Orders  Family Communication: Admission, patient's condition and plan of care including tests being ordered have been discussed with the patient who indicate understanding and agree with the plan and Code Status.  Code Status: Full Code  Consults called: None    Admission status: Inpatient    Time spent in minutes : 50 minutes [place], MSN, FNP-C Patient [facility] [address] [place], [place] [address] [phone]  05/14/2022 at 3:48 PM

## 2022-05-14 NOTE — ED Notes (Signed)
Patient wants to wait on collect her labs.  Patient states" she is a hard stick."

## 2022-05-15 ENCOUNTER — Observation Stay (HOSPITAL_COMMUNITY): Payer: Medicaid Other

## 2022-05-15 DIAGNOSIS — Z8249 Family history of ischemic heart disease and other diseases of the circulatory system: Secondary | ICD-10-CM | POA: Diagnosis not present

## 2022-05-15 DIAGNOSIS — D5700 Hb-SS disease with crisis, unspecified: Secondary | ICD-10-CM | POA: Diagnosis present

## 2022-05-15 DIAGNOSIS — Z79899 Other long term (current) drug therapy: Secondary | ICD-10-CM | POA: Diagnosis not present

## 2022-05-15 DIAGNOSIS — Z888 Allergy status to other drugs, medicaments and biological substances status: Secondary | ICD-10-CM | POA: Diagnosis not present

## 2022-05-15 DIAGNOSIS — F419 Anxiety disorder, unspecified: Secondary | ICD-10-CM | POA: Diagnosis present

## 2022-05-15 DIAGNOSIS — F1120 Opioid dependence, uncomplicated: Secondary | ICD-10-CM | POA: Diagnosis present

## 2022-05-15 DIAGNOSIS — Z8261 Family history of arthritis: Secondary | ICD-10-CM | POA: Diagnosis not present

## 2022-05-15 DIAGNOSIS — G894 Chronic pain syndrome: Secondary | ICD-10-CM | POA: Diagnosis present

## 2022-05-15 DIAGNOSIS — Z841 Family history of disorders of kidney and ureter: Secondary | ICD-10-CM | POA: Diagnosis not present

## 2022-05-15 DIAGNOSIS — F3130 Bipolar disorder, current episode depressed, mild or moderate severity, unspecified: Secondary | ICD-10-CM | POA: Diagnosis present

## 2022-05-15 DIAGNOSIS — R0789 Other chest pain: Secondary | ICD-10-CM | POA: Diagnosis present

## 2022-05-15 DIAGNOSIS — D638 Anemia in other chronic diseases classified elsewhere: Secondary | ICD-10-CM | POA: Diagnosis present

## 2022-05-15 DIAGNOSIS — F17210 Nicotine dependence, cigarettes, uncomplicated: Secondary | ICD-10-CM | POA: Diagnosis present

## 2022-05-15 DIAGNOSIS — Z832 Family history of diseases of the blood and blood-forming organs and certain disorders involving the immune mechanism: Secondary | ICD-10-CM | POA: Diagnosis not present

## 2022-05-15 LAB — CBC
HCT: 28.4 % — ABNORMAL LOW (ref 36.0–46.0)
Hemoglobin: 9.7 g/dL — ABNORMAL LOW (ref 12.0–15.0)
MCH: 30.1 pg (ref 26.0–34.0)
MCHC: 34.2 g/dL (ref 30.0–36.0)
MCV: 88.2 fL (ref 80.0–100.0)
Platelets: 429 10*3/uL — ABNORMAL HIGH (ref 150–400)
RBC: 3.22 MIL/uL — ABNORMAL LOW (ref 3.87–5.11)
RDW: 13.6 % (ref 11.5–15.5)
WBC: 14.1 10*3/uL — ABNORMAL HIGH (ref 4.0–10.5)
nRBC: 0.3 % — ABNORMAL HIGH (ref 0.0–0.2)

## 2022-05-15 NOTE — Progress Notes (Signed)
SICKLE CELL SERVICE PROGRESS NOTE  Carmen [name] [id number] DOB: 1991-05-19 DOA: 05/14/2022 PCP: [name], FNP  Assessment/Plan: Principal Problem:   Sickle cell pain crisis [facility]) Active Problems:   Chest pain   Anemia of chronic disease   Chronic pain syndrome  Sickle cell pain crisis: Patient 1 be maintained on current regimen of Dilaudid PCA, Toradol and her long-acting pain medications.  Also IV fluids Chest pain: Patient still complains of ongoing chest pain.  Appears to be noncardiac.  Continue to monitor Anemia of chronic disease: H&H is stable.  Continue to monitor Chronic pain syndrome: Continue long-acting pain medication  Code Status: Full code Family Communication: No family at bed Disposition Plan: [place]  Pager [phone]. If 7PM-7AM, please contact night-coverage.  05/15/2022, 1:17 PM  LOS: 0 days   Brief narrative:  Carmen [name]  is a 31 y.o. female with a medical history significant for sickle cell disease, chronic pain syndrome, opiate dependence and tolerance, history of depression, and history of anemia of chronic disease presents to the sickle cell day infusion clinic with complaints of chest pain, low back, and lower extremity pain.  Patient was recently discharged from inpatient services on 05/12/2022.  Patient transferred from emergency department, agreed with EDP that patient was appropriate for transition.  Patient states that she assisted her sister with moving which triggered a sickle cell pain crisis.  She rates her pain at 9/10, constant, and throbbing.   She last had oxycodone this a.m. without sustained relief.  Patient denies any headache, shortness of breath, fever, or chills.  No urinary symptoms, nausea, vomiting, or diarrhea.  Consultants: None  Procedures: None today  Antibiotics: None  HPI/Subjective: Patient is complaining of 7 out of 10 pain today mainly in her chest wall as well as the back.  Has been using the  Dilaudid PCA.  No other complaints.  Objective: Vitals:   05/15/22 0521 05/15/22 0736 05/15/22 0747 05/15/22 1047  BP:    119/65  Pulse:    (!) 56  Resp: '14 15 15 18  '$ Temp:    98.3 F (36.8 C)  TempSrc:    Oral  SpO2: 90% 95% 95% 100%   Weight change:   Intake/Output Summary (Last 24 hours) at 05/15/2022 1317 Last data filed at 05/15/2022 0349 Gross per 24 hour  Intake 1724.39 ml  Output --  Net 1724.39 ml    General: Alert, awake, oriented x3, in no acute distress.  HEENT: [place]/AT PEERL, EOMI Neck: Trachea midline,  no masses, no thyromegal,y no JVD, no carotid bruit OROPHARYNX:  Moist, No exudate/ erythema/lesions.  Heart: Regular rate and rhythm, without murmurs, rubs, gallops, PMI non-displaced, no heaves or thrills on palpation.  Lungs: Clear to auscultation, no wheezing or rhonchi noted. No increased vocal fremitus resonant to percussion  Abdomen: Soft, nontender, nondistended, positive bowel sounds, no masses no hepatosplenomegaly noted..  Neuro: No focal neurological deficits noted cranial nerves II through XII grossly intact. DTRs 2+ bilaterally upper and lower extremities. Strength 5 out of 5 in bilateral upper and lower extremities. Musculoskeletal: No warm swelling or erythema around joints, no spinal tenderness noted. Psychiatric: Patient alert and oriented x3, good insight and cognition, good recent to remote recall. Lymph node survey: No cervical axillary or inguinal lymphadenopathy noted.   Data Reviewed: Basic Metabolic Panel: Recent Labs  Lab 05/10/22 1048 05/12/22 0724  NA 139 139  K 3.8 3.8  CL 109 107  CO2 24 24  GLUCOSE 98  97  BUN 10 9  CREATININE 0.70 0.42*  CALCIUM 9.4 9.2   Liver Function Tests: No results for input(s): "AST", "ALT", "ALKPHOS", "BILITOT", "PROT", "ALBUMIN" in the last 168 hours. No results for input(s): "LIPASE", "AMYLASE" in the last 168 hours. No results for input(s): "AMMONIA" in the last 168 hours. CBC: Recent Labs   Lab 05/10/22 1048 05/11/22 0614 05/12/22 0724 05/14/22 1235 05/15/22 0732  WBC 10.2 9.8 8.4 7.7 14.1*  NEUTROABS 5.8  --   --   --   --   HGB 10.2* 9.3* 9.3* 10.2* 9.7*  HCT 28.2* 26.4* 26.4* 29.4* 28.4*  MCV 82.7 83.5 82.5 83.3 88.2  PLT 501* 418* 446* 520* 429*   Cardiac Enzymes: No results for input(s): "CKTOTAL", "CKMB", "CKMBINDEX", "TROPONINI" in the last 168 hours. BNP (last 3 results) No results for input(s): "BNP" in the last 8760 hours.  ProBNP (last 3 results) No results for input(s): "PROBNP" in the last 8760 hours.  CBG: No results for input(s): "GLUCAP" in the last 168 hours.  No results found for this or any previous visit (from the past 240 hour(s)).   Studies: DG Chest 2 View  Result Date: 05/15/2022 CLINICAL DATA:  Chest pain today.  Sickle cell disease. EXAM: CHEST - 2 VIEW COMPARISON:  Two-view chest x-ray 03/01/2022 FINDINGS: Heart size is normal. Lungs are clear. No edema or effusion is present. No focal airspace disease present. Sequelae of sickle cell disease noted in the thoracolumbar spine. No acute fractures. IMPRESSION: No acute cardiopulmonary disease. Electronically Signed   By: San Morelle M.D.   On: 05/15/2022 12:26    Scheduled Meds:  enoxaparin (LOVENOX) injection  40 mg Subcutaneous QHS   folic acid  1 mg Oral Daily   HYDROmorphone   Intravenous Q4H   ketorolac  15 mg Intravenous Q6H   morphine  60 mg Oral Q12H   Continuous Infusions:  sodium chloride 10 mL/hr at 05/15/22 0349    Principal Problem:   Sickle cell pain crisis [facility]) Active Problems:   Chest pain   Anemia of chronic disease   Chronic pain syndrome

## 2022-05-16 DIAGNOSIS — D5700 Hb-SS disease with crisis, unspecified: Secondary | ICD-10-CM | POA: Diagnosis not present

## 2022-05-16 NOTE — Progress Notes (Signed)
SICKLE CELL SERVICE PROGRESS NOTE  Carmen [name] [id number]:[id number] DOB: Mar 12, 1991 DOA: 05/14/2022 PCP: [name], FNP  Assessment/Plan: Principal Problem:   Sickle cell pain crisis [facility]) Active Problems:   Chest pain   Anemia of chronic disease   Chronic pain syndrome  Sickle cell pain crisis: Patient improving. Will be maintained on current regimen of Dilaudid PCA, Toradol and her long-acting pain medications.  Also IV fluids Chest pain: Patient still complains of ongoing chest pain.  Appears to be noncardiac.  Continue to monitor Anemia of chronic disease: H&H is stable.  Continue to monitor Chronic pain syndrome: Continue long-acting pain medication  Code Status: Full code Family Communication: No family at bed Disposition Plan: [place]  Pager [phone]. If 7PM-7AM, please contact night-coverage.  05/16/2022, 5:29 PM  LOS: 1 day   Brief narrative:  Carmen [name]  is a 31 y.o. female with a medical history significant for sickle cell disease, chronic pain syndrome, opiate dependence and tolerance, history of depression, and history of anemia of chronic disease presents to the sickle cell day infusion clinic with complaints of chest pain, low back, and lower extremity pain.  Patient was recently discharged from inpatient services on 05/12/2022.  Patient transferred from emergency department, agreed with EDP that patient was appropriate for transition.  Patient states that she assisted her sister with moving which triggered a sickle cell pain crisis.  She rates her pain at 9/10, constant, and throbbing.   She last had oxycodone this a.m. without sustained relief.  Patient denies any headache, shortness of breath, fever, or chills.  No urinary symptoms, nausea, vomiting, or diarrhea.  Consultants: None  Procedures: None today  Antibiotics: None  HPI/Subjective: Patient is complaining of 6 out of 10 pain today mainly in her chest wall as well as the back.  Has  been using the Dilaudid PCA.  No other complaints.  Objective: Vitals:   05/16/22 0953 05/16/22 0957 05/16/22 1236 05/16/22 1315  BP: 132/83   119/82  Pulse: 80   62  Resp: '18 14 18 18  '$ Temp: 98.2 F (36.8 C)   (!) 97.4 F (36.3 C)  TempSrc: Oral   Oral  SpO2: 100% 98% 100% 99%   Weight change:   Intake/Output Summary (Last 24 hours) at 05/16/2022 1729 Last data filed at 05/16/2022 4401 Gross per 24 hour  Intake 540.29 ml  Output --  Net 540.29 ml     General: Alert, awake, oriented x3, in no acute distress.  HEENT: [place]/AT PEERL, EOMI Neck: Trachea midline,  no masses, no thyromegal,y no JVD, no carotid bruit OROPHARYNX:  Moist, No exudate/ erythema/lesions.  Heart: Regular rate and rhythm, without murmurs, rubs, gallops, PMI non-displaced, no heaves or thrills on palpation.  Lungs: Clear to auscultation, no wheezing or rhonchi noted. No increased vocal fremitus resonant to percussion  Abdomen: Soft, nontender, nondistended, positive bowel sounds, no masses no hepatosplenomegaly noted..  Neuro: No focal neurological deficits noted cranial nerves II through XII grossly intact. DTRs 2+ bilaterally upper and lower extremities. Strength 5 out of 5 in bilateral upper and lower extremities. Musculoskeletal: No warm swelling or erythema around joints, no spinal tenderness noted. Psychiatric: Patient alert and oriented x3, good insight and cognition, good recent to remote recall. Lymph node survey: No cervical axillary or inguinal lymphadenopathy noted.   Data Reviewed: Basic Metabolic Panel: Recent Labs  Lab 05/10/22 1048 05/12/22 0724  NA 139 139  K 3.8 3.8  CL 109 107  CO2  24 24  GLUCOSE 98 97  BUN 10 9  CREATININE 0.70 0.42*  CALCIUM 9.4 9.2    Liver Function Tests: No results for input(s): "AST", "ALT", "ALKPHOS", "BILITOT", "PROT", "ALBUMIN" in the last 168 hours. No results for input(s): "LIPASE", "AMYLASE" in the last 168 hours. No results for input(s): "AMMONIA"  in the last 168 hours. CBC: Recent Labs  Lab 05/10/22 1048 05/11/22 0614 05/12/22 0724 05/14/22 1235 05/15/22 0732  WBC 10.2 9.8 8.4 7.7 14.1*  NEUTROABS 5.8  --   --   --   --   HGB 10.2* 9.3* 9.3* 10.2* 9.7*  HCT 28.2* 26.4* 26.4* 29.4* 28.4*  MCV 82.7 83.5 82.5 83.3 88.2  PLT 501* 418* 446* 520* 429*    Cardiac Enzymes: No results for input(s): "CKTOTAL", "CKMB", "CKMBINDEX", "TROPONINI" in the last 168 hours. BNP (last 3 results) No results for input(s): "BNP" in the last 8760 hours.  ProBNP (last 3 results) No results for input(s): "PROBNP" in the last 8760 hours.  CBG: No results for input(s): "GLUCAP" in the last 168 hours.  No results found for this or any previous visit (from the past 240 hour(s)).   Studies: DG Chest 2 View  Result Date: 05/15/2022 CLINICAL DATA:  Chest pain today.  Sickle cell disease. EXAM: CHEST - 2 VIEW COMPARISON:  Two-view chest x-ray 03/01/2022 FINDINGS: Heart size is normal. Lungs are clear. No edema or effusion is present. No focal airspace disease present. Sequelae of sickle cell disease noted in the thoracolumbar spine. No acute fractures. IMPRESSION: No acute cardiopulmonary disease. Electronically Signed   By: San Morelle M.D.   On: 05/15/2022 12:26    Scheduled Meds:  enoxaparin (LOVENOX) injection  40 mg Subcutaneous QHS   folic acid  1 mg Oral Daily   HYDROmorphone   Intravenous Q4H   ketorolac  15 mg Intravenous Q6H   morphine  60 mg Oral Q12H   Continuous Infusions:  sodium chloride 10 mL/hr at 05/15/22 2114    Principal Problem:   Sickle cell pain crisis [facility]) Active Problems:   Chest pain   Anemia of chronic disease   Chronic pain syndrome

## 2022-05-16 NOTE — Progress Notes (Signed)
Carmen [name] states she will walk in the hall, instead of using SCD'S.

## 2022-05-16 NOTE — Progress Notes (Signed)
  Transition of Care [facility]) Screening Note   Patient Details  Name: Carmen [name] Date of Birth: December 11, 1990   Transition of Care [facility]) CM/SW Contact:    Dessa Phi, RN Phone Number: 05/16/2022, 2:53 PM    Transition of Care Department [facility]) has reviewed patient and no TOC needs have been identified at this time. We will continue to monitor patient advancement through interdisciplinary progression rounds. If new patient transition needs arise, please place a TOC consult.

## 2022-05-17 ENCOUNTER — Ambulatory Visit: Payer: Self-pay

## 2022-05-17 DIAGNOSIS — D5700 Hb-SS disease with crisis, unspecified: Secondary | ICD-10-CM | POA: Diagnosis not present

## 2022-05-17 MED ORDER — OXYCODONE HCL 5 MG PO TABS
10.0000 mg | ORAL_TABLET | ORAL | Status: DC
  Administered 2022-05-17 – 2022-05-18 (×5): 10 mg via ORAL
  Filled 2022-05-17 (×5): qty 2

## 2022-05-17 MED ORDER — ALPRAZOLAM 0.25 MG PO TABS
0.2500 mg | ORAL_TABLET | Freq: Once | ORAL | Status: AC
Start: 2022-05-17 — End: 2022-05-17
  Administered 2022-05-17: 0.25 mg via ORAL
  Filled 2022-05-17: qty 1

## 2022-05-17 MED ORDER — MEDROXYPROGESTERONE ACETATE 150 MG/ML IM SUSP
150.0000 mg | Freq: Once | INTRAMUSCULAR | Status: AC
  Administered 2022-05-17: 150 mg via INTRAMUSCULAR
  Filled 2022-05-17 (×2): qty 1

## 2022-05-17 MED ORDER — HYDROMORPHONE 1 MG/ML IV SOLN
INTRAVENOUS | Status: DC
  Administered 2022-05-17: 2.5 mg via INTRAVENOUS
  Administered 2022-05-17: 5.5 mg via INTRAVENOUS
  Administered 2022-05-17: 4.5 mg via INTRAVENOUS
  Administered 2022-05-18: 5 mg via INTRAVENOUS
  Administered 2022-05-18: 7.5 mg via INTRAVENOUS
  Administered 2022-05-18: 1.5 mg via INTRAVENOUS
  Administered 2022-05-18: 30 mg via INTRAVENOUS
  Filled 2022-05-17: qty 30

## 2022-05-17 NOTE — Progress Notes (Signed)
Subjective: Carmen [name] is a 31 year old female with a medical history significant for sickle cell disease, chronic pain syndrome, opiate dependence and tolerance, bipolar disorder, and anemia of chronic disease that was admitted for sickle cell pain crisis.  Patient continues to complain of severe low back pain.  She rates her pain as 8/10 today.  She says that her pain persists despite IV Dilaudid PCA. She denies any headache, chest pain, urinary symptoms, nausea, vomiting, or diarrhea.  Objective:  Vital signs in last 24 hours:  Vitals:   05/17/22 0933 05/17/22 1048 05/17/22 1407 05/17/22 1427  BP:  126/79  116/75  Pulse:  97  74  Resp: '14 14 19 15  '$ Temp:  98.4 F (36.9 C)  98 F (36.7 C)  TempSrc:  Oral  Oral  SpO2: 98% 100% 100% 93%    Intake/Output from previous day:  No intake or output data in the 24 hours ending 05/17/22 1736  Physical Exam: General: Alert, awake, oriented x3, in no acute distress.  HEENT: [place]/AT PEERL, EOMI Neck: Trachea midline,  no masses, no thyromegal,y no JVD, no carotid bruit OROPHARYNX:  Moist, No exudate/ erythema/lesions.  Heart: Regular rate and rhythm, without murmurs, rubs, gallops, PMI non-displaced, no heaves or thrills on palpation.  Lungs: Clear to auscultation, no wheezing or rhonchi noted. No increased vocal fremitus resonant to percussion  Abdomen: Soft, nontender, nondistended, positive bowel sounds, no masses no hepatosplenomegaly noted..  Neuro: No focal neurological deficits noted cranial nerves II through XII grossly intact. DTRs 2+ bilaterally upper and lower extremities. Strength 5 out of 5 in bilateral upper and lower extremities. Musculoskeletal: No warm swelling or erythema around joints, no spinal tenderness noted. Psychiatric: Patient alert and oriented x3, good insight and cognition, good recent to remote recall. Lymph node survey: No cervical axillary or inguinal lymphadenopathy noted.  Lab Results:  Basic Metabolic  Panel:    Component Value Date/Time   NA 139 05/12/2022 0724   NA 142 07/02/2020 1040   K 3.8 05/12/2022 0724   CL 107 05/12/2022 0724   CO2 24 05/12/2022 0724   BUN 9 05/12/2022 0724   BUN 8 07/02/2020 1040   CREATININE 0.42 (L) 05/12/2022 0724   GLUCOSE 97 05/12/2022 0724   CALCIUM 9.2 05/12/2022 0724   CBC:    Component Value Date/Time   WBC 14.1 (H) 05/15/2022 0732   HGB 9.7 (L) 05/15/2022 0732   HGB 9.8 (L) 07/02/2020 1040   HCT 28.4 (L) 05/15/2022 0732   HCT 28.9 (L) 07/02/2020 1040   PLT 429 (H) 05/15/2022 0732   PLT 628 (H) 07/02/2020 1040   MCV 88.2 05/15/2022 0732   MCV 88 07/02/2020 1040   NEUTROABS 5.8 05/10/2022 1048   NEUTROABS 3.0 07/02/2020 1040   LYMPHSABS 2.9 05/10/2022 1048   LYMPHSABS 3.5 (H) 07/02/2020 1040   MONOABS 1.2 (H) 05/10/2022 1048   EOSABS 0.3 05/10/2022 1048   EOSABS 0.2 07/02/2020 1040   BASOSABS 0.0 05/10/2022 1048   BASOSABS 0.1 07/02/2020 1040    No results found for this or any previous visit (from the past 240 hour(s)).  Studies/Results: No results found.  Medications: Scheduled Meds:  enoxaparin (LOVENOX) injection  40 mg Subcutaneous QHS   folic acid  1 mg Oral Daily   HYDROmorphone   Intravenous Q4H   ketorolac  15 mg Intravenous Q6H   medroxyPROGESTERone  150 mg Intramuscular Once   morphine  60 mg Oral Q12H   oxyCODONE  10 mg Oral Q4H while  awake   Continuous Infusions:  sodium chloride 10 mL/hr at 05/15/22 2114   PRN Meds:.diphenhydrAMINE, naloxone **AND** sodium chloride flush, ondansetron (ZOFRAN) IV  Consultants: none  Procedures: none  Antibiotics: none  Assessment/Plan: Principal Problem:   Sickle cell pain crisis ([place]) Active Problems:   Chest pain   Anemia of chronic disease   Chronic pain syndrome  Sickle cell disease with pain crisis: Weaning IV Dilaudid PCA Initiate oxycodone 10 mg every 4 hours as needed for severe breakthrough pain MS Contin 60 mg every 12 hours Toradol 15 mg every 6  hours for total of 5 days Monitor vital signs very closely, reevaluate pain scale regularly, and supplemental oxygen as needed  Chronic pain syndrome: Continue home medications  Bipolar depression: Stable.  Continue home medications.  Monitor closely.  Anemia of chronic disease: Stable.  There is no clinical indication for blood transfusion at this time.  Continue to follow closely.   Code Status: Full Code Family Communication: N/A Disposition Plan: Not yet ready for discharge.  Discharge planned for 05/18/2022   [name]  APRN, MSN, FNP-C Patient [address] [place], [place] [address] [phone]  If 7PM-7AM, please contact night-coverage.  05/17/2022, 5:36 PM  LOS: 2 days

## 2022-05-17 NOTE — ED Provider Notes (Signed)
[place] DEPT Provider Note   CSN: [id number] Arrival date & time: 05/14/22  1028     History  Chief Complaint  Patient presents with   Sickle Cell Pain Crisis    Carmen [name] is a 31 y.o. female.  HPI 31 year old female with sickle cell anemia presents today complaining of pain throughout her body.  Patient was seen and admitted for 2 days and discharged 2 days ago.  She reports that she has just had ongoing pain.  She states it is over her entire body.  She denies any chest pain or dyspnea.    Home Medications Prior to Admission medications   Medication Sig Start Date End Date Taking? Authorizing Provider  betamethasone valerate ointment (VALISONE) 0.1 % APPLY TO AFFECTED AREA TWICE A DAY Patient not taking: Reported on 05/10/2022 04/07/22   [name], FNP  citalopram (CELEXA) 20 MG tablet Take 1 tablet (20 mg total) by mouth every morning. Patient not taking: Reported on 05/10/2022 04/07/22   [name], FNP  cyclobenzaprine (FLEXERIL) 10 MG tablet Take 1 tablet (10 mg total) by mouth daily as needed for muscle spasms. 04/07/22   [name], FNP  DULoxetine (CYMBALTA) 30 MG capsule Take 2 capsules (60 mg total) by mouth daily. Patient not taking: Reported on 05/10/2022 02/04/22 08/03/22  [name], FNP  folic acid (FOLVITE) 1 MG tablet Take 1 tablet (1 mg total) by mouth daily. 03/15/19   Lanae Boast, FNP  morphine (MS CONTIN) 60 MG 12 hr tablet Take 1 tablet (60 mg total) by mouth every 12 (twelve) hours. Patient taking differently: Take 60 mg by mouth every 12 (twelve) hours as needed for pain. 05/13/22   [name], FNP  Oxycodone HCl 10 MG TABS Take 1 tablet (10 mg total) by mouth every 6 (six) hours as needed (pain). 05/07/22   [name], FNP  Vitamin D, Ergocalciferol, (DRISDOL) 1.25 MG (50000 UNIT) CAPS capsule TAKE ONE CAPSULE BY MOUTH ONE TIME PER WEEK Patient taking differently: Take 50,000 Units by  mouth every Wednesday. 09/03/20   Vevelyn Francois, NP      Allergies    Ketamine    Review of Systems   Review of Systems  Physical Exam Updated Vital Signs BP 123/81 (BP Location: Right Arm)   Pulse 99   Temp 99.9 F (37.7 C) (Oral)   Resp 18   Ht 1.6 m ('5\' 3"'$ )   Wt 83.9 kg   LMP 04/20/2022 (Approximate)   SpO2 97%   BMI 32.77 kg/m  Physical Exam Vitals and nursing note reviewed.  Constitutional:      General: She is not in acute distress.    Appearance: She is well-developed.  HENT:     Head: Normocephalic and atraumatic.     Right Ear: External ear normal.     Left Ear: External ear normal.     Nose: Nose normal.  Eyes:     Conjunctiva/sclera: Conjunctivae normal.     Pupils: Pupils are equal, round, and reactive to light.  Cardiovascular:     Rate and Rhythm: Normal rate and regular rhythm.  Pulmonary:     Effort: Pulmonary effort is normal.  Abdominal:     General: Abdomen is flat.     Palpations: Abdomen is soft.  Musculoskeletal:        General: Normal range of motion.     Cervical back: Normal range of motion and neck supple.  Skin:  General: Skin is warm and dry.  Neurological:     Mental Status: She is alert and oriented to person, place, and time.     Motor: No abnormal muscle tone.     Coordination: Coordination normal.  Psychiatric:        Behavior: Behavior normal.        Thought Content: Thought content normal.     ED Results / Procedures / Treatments   Labs (all labs ordered are listed, but only abnormal results are displayed) Labs Reviewed - No data to display  EKG None  Radiology No results found.  Procedures Procedures    Medications Ordered in ED Medications - No data to display  ED Course/ Medical Decision Making/ A&P                           Medical Decision Making 31 year old female with sickle cell anemia with recent admission and discharged to the hospital.  Here in the ED she appears hemodynamically stable.   Patient evaluated with physical exam and labs.  Her hemoglobin is stable.  No signs of infection or acute chest syndrome Differential diagnosis includes but is not limited to sickle cell pain crisis, chronic pain, infection, and other complications of sickle cell disease Patient care was discussed with sickle cell clinic and they advised that patient can come to the sickle cell clinic for further treatment  Amount and/or Complexity of Data Reviewed Labs: ordered. Decision-making details documented in ED Course.           Final Clinical Impression(s) / ED Diagnoses Final diagnoses:  Sickle cell pain crisis [facility])    Rx / DC Orders ED Discharge Orders     None         Pattricia Boss, MD 05/17/22 1754

## 2022-05-17 NOTE — Telephone Encounter (Signed)
PA approved for Morphine ER  '60mg'$   Prior Approval #:[id number] PA Type:PHARMACYRecipient:Carmen R SIMMONSRecipient JL:[id number] OBilling Provider:Billing Provider MZ:[id number] Provider Name:[facility] [place] Provider XL:[phone] Submission Date:07/17/2023Status:APPROVEDEffective Begin Date:07/17/2023Effective End D

## 2022-05-18 ENCOUNTER — Telehealth: Payer: Self-pay

## 2022-05-18 ENCOUNTER — Ambulatory Visit: Payer: Self-pay | Admitting: Family Medicine

## 2022-05-18 DIAGNOSIS — D5700 Hb-SS disease with crisis, unspecified: Secondary | ICD-10-CM | POA: Diagnosis not present

## 2022-05-18 LAB — CBC
HCT: 23.5 % — ABNORMAL LOW (ref 36.0–46.0)
Hemoglobin: 8.8 g/dL — ABNORMAL LOW (ref 12.0–15.0)
MCH: 30.1 pg (ref 26.0–34.0)
MCHC: 37.4 g/dL — ABNORMAL HIGH (ref 30.0–36.0)
MCV: 80.5 fL (ref 80.0–100.0)
Platelets: 469 10*3/uL — ABNORMAL HIGH (ref 150–400)
RBC: 2.92 MIL/uL — ABNORMAL LOW (ref 3.87–5.11)
RDW: 13.9 % (ref 11.5–15.5)
WBC: 12.2 10*3/uL — ABNORMAL HIGH (ref 4.0–10.5)
nRBC: 0.5 % — ABNORMAL HIGH (ref 0.0–0.2)

## 2022-05-18 NOTE — Discharge Summary (Signed)
Physician Discharge Summary  Carmen [name] XBJ:[id number] DOB: 1991-09-21 DOA: 05/14/2022  PCP: Carmen Dew, FNP  Admit date: 05/14/2022  Discharge date: 05/18/2022  Discharge Diagnoses:  Principal Problem:   Sickle cell pain crisis ([place]) Active Problems:   Chest pain   Anemia of chronic disease   Chronic pain syndrome   Discharge Condition: Stable  Disposition:   Follow-up Information     Carmen Dew, FNP Follow up.   Specialty: Family Medicine Contact information: [place]. [place] [address] [phone]                Pt is discharged home in good condition and is to follow up with Carmen Dew, FNP this week to have labs evaluated. Carmen [name] is instructed to increase activity slowly and balance with rest for the next few days, and use prescribed medication to complete treatment of pain  Diet: Regular Wt Readings from Last 3 Encounters:  05/14/22 83.9 kg  05/10/22 91.3 kg  04/07/22 90.5 kg    History of present illness:  Carmen [name]  is a 31 y.o. female with a medical history significant for sickle cell disease, chronic pain syndrome, opiate dependence and tolerance, history of depression, and history of anemia of chronic disease presents to the sickle cell day infusion clinic with complaints of chest pain, low back, and lower extremity pain.  Patient was recently discharged from inpatient services on 05/12/2022.  Patient transferred from emergency department, agreed with EDP that patient was appropriate for transition.  Patient states that she assisted her sister with moving which triggered a sickle cell pain crisis.  She rates her pain at 9/10, constant, and throbbing.   She last had oxycodone this a.m. without sustained relief.  Patient denies any headache, shortness of breath, fever, or chills.  No urinary symptoms, nausea, vomiting, or diarrhea.  Sickle cell center course: While at sickle cell center, patient's vital  signs remained stable.  Reviewed laboratory values, unremarkable. Carmen [name] continues to have chest pain to central chest despite IV dilaudid PCA.   Patient will transfer to [facility] for management of sickle cell pain crisis.  Hospital Course:  Sickle cell pain crisis:  Patient was admitted for sickle cell pain crisis and managed appropriately with IVF, IV Dilaudid via PCA and IV Toradol, as well as other adjunct therapies per sickle cell pain management protocols.  Patient's pain intensity decreased to 4/10 and she is requesting discharge home.  Patient advised to resume all of her home medications.  PCA was weaned and patient was transition to her home medications. She is to follow-up with PCP in 2 weeks for medication management and to repeat CBC with differential and CMP.  Patient was therefore discharged home today in a hemodynamically stable condition.   Carmen [name] will follow-up with PCP within 1 week of this discharge. Carmen [name] was counseled extensively about nonpharmacologic means of pain management, patient verbalized understanding and was appreciative of  the care received during this admission.   We discussed the need for good hydration, monitoring of hydration status, avoidance of heat, cold, stress, and infection triggers. We discussed the need to be adherent with taking home medications. Patient was reminded of the need to seek medical attention immediately if any symptom of bleeding, anemia, or infection occurs.  Discharge Exam: Vitals:   05/18/22 1050 05/18/22 1226  BP: 119/73   Pulse: 87   Resp: 16 14  Temp: 98.6 F (37 C)  SpO2: 98% 97%   Vitals:   05/18/22 0527 05/18/22 0813 05/18/22 1050 05/18/22 1226  BP: 109/65  119/73   Pulse: 87  87   Resp: '18 18 16 14  '$ Temp: 98.4 F (36.9 C)  98.6 F (37 C)   TempSrc: Oral  Oral   SpO2: 95% 98% 98% 97%    General appearance : Awake, alert, not in any distress. Speech Clear. Not toxic looking HEENT: Atraumatic and  Normocephalic, pupils equally reactive to light and accomodation Neck: Supple, no JVD. No cervical lymphadenopathy.  Chest: Good air entry bilaterally, no added sounds  CVS: S1 S2 regular, no murmurs.  Abdomen: Bowel sounds present, Non tender and not distended with no gaurding, rigidity or rebound. Extremities: B/L Lower Ext shows no edema, both legs are warm to touch Neurology: Awake alert, and oriented X 3, CN II-XII intact, Non focal Skin: No Rash  Discharge Instructions  Discharge Instructions     Discharge patient   Complete by: As directed    Discharge disposition: 01-Home or Self Care   Discharge patient date: 05/18/2022      Allergies as of 05/18/2022       Reactions   Ketamine Hives, Other (See Comments)   Makes the patient "feel funny" also        Medication List     STOP taking these medications    citalopram 20 MG tablet Commonly known as: CELEXA       TAKE these medications    betamethasone valerate ointment 0.1 % Commonly known as: VALISONE APPLY TO AFFECTED AREA TWICE A DAY   cyclobenzaprine 10 MG tablet Commonly known as: FLEXERIL Take 1 tablet (10 mg total) by mouth daily as needed for muscle spasms.   DULoxetine 30 MG capsule Commonly known as: CYMBALTA Take 2 capsules (60 mg total) by mouth daily.   folic acid 1 MG tablet Commonly known as: FOLVITE Take 1 tablet (1 mg total) by mouth daily.   morphine 60 MG 12 hr tablet Commonly known as: MS CONTIN Take 1 tablet (60 mg total) by mouth every 12 (twelve) hours. What changed:  when to take this reasons to take this   Oxycodone HCl 10 MG Tabs Take 1 tablet (10 mg total) by mouth every 6 (six) hours as needed (pain).   Vitamin D (Ergocalciferol) 1.25 MG (50000 UNIT) Caps capsule Commonly known as: DRISDOL TAKE ONE CAPSULE BY MOUTH ONE TIME PER WEEK What changed:  how much to take how to take this when to take this additional instructions        The results of significant  diagnostics from this hospitalization (including imaging, microbiology, ancillary and laboratory) are listed below for reference.    Significant Diagnostic Studies: DG Chest 2 View  Result Date: 05/15/2022 CLINICAL DATA:  Chest pain today.  Sickle cell disease. EXAM: CHEST - 2 VIEW COMPARISON:  Two-view chest x-ray 03/01/2022 FINDINGS: Heart size is normal. Lungs are clear. No edema or effusion is present. No focal airspace disease present. Sequelae of sickle cell disease noted in the thoracolumbar spine. No acute fractures. IMPRESSION: No acute cardiopulmonary disease. Electronically Signed   By: San Morelle M.D.   On: 05/15/2022 12:26    Microbiology: No results found for this or any previous visit (from the past 240 hour(s)).   Labs: Basic Metabolic Panel: Recent Labs  Lab 05/12/22 0724  NA 139  K 3.8  CL 107  CO2 24  GLUCOSE 97  BUN 9  CREATININE 0.42*  CALCIUM 9.2   Liver Function Tests: No results for input(s): "AST", "ALT", "ALKPHOS", "BILITOT", "PROT", "ALBUMIN" in the last 168 hours. No results for input(s): "LIPASE", "AMYLASE" in the last 168 hours. No results for input(s): "AMMONIA" in the last 168 hours. CBC: Recent Labs  Lab 05/12/22 0724 05/14/22 1235 05/15/22 0732 05/18/22 0641  WBC 8.4 7.7 14.1* 12.2*  HGB 9.3* 10.2* 9.7* 8.8*  HCT 26.4* 29.4* 28.4* 23.5*  MCV 82.5 83.3 88.2 80.5  PLT 446* 520* 429* 469*   Cardiac Enzymes: No results for input(s): "CKTOTAL", "CKMB", "CKMBINDEX", "TROPONINI" in the last 168 hours. BNP: Invalid input(s): "POCBNP" CBG: No results for input(s): "GLUCAP" in the last 168 hours.  Time coordinating discharge: 30 minutes  Signed:  [name]  APRN, MSN, FNP-C [facility] [address] [place], [place] [address] [phone]  [facility] 05/18/2022, 3:21 PM

## 2022-05-18 NOTE — Telephone Encounter (Deleted)
PA started for Oxycodone '10MG'$  [id number] W Confirmation #:[id number] WBenefit Plan:MCAIDHealth Plan:NCXIX Prior Approval #:[id number] PA Type:PHARMACYRecipient:Remonia R SIMMONSRecipient SO:[id number] OBilling Provider:Billing Provider NO:[id number] Provider Name:THE [place] Provider SD:[phone] Submission Date:07/18/2023Status:APPROVEDEffective Begin Date:07/18/2023Effective End Date:01/14/2024Payer:[place] [employer] HE[employer] BENEFITS

## 2022-05-18 NOTE — Telephone Encounter (Signed)
PA started and approved for Oxycodone '10MG'$  Confirmation #:[id number] W  Confirmation #:[id number] WBenefit Plan:MCAIDHealth Plan:NCXIX Prior Approval #:[id number] PA Type:PHARMACYRecipient:Carmen R SIMMONSRecipient CH:[id number] OBilling Provider:Billing Provider ZP:[id number] Provider Name:THE [place] Provider TK:[id number] Submission Date:07/18/2023Status:APPROVEDEffective Begin Date:07/18/2023Effective End Date:01/14/2024Payer:[place] [employer] HEALTH BENEFITS# of Attachments:1

## 2022-05-21 ENCOUNTER — Other Ambulatory Visit: Payer: Self-pay | Admitting: Family Medicine

## 2022-05-21 DIAGNOSIS — Z79891 Long term (current) use of opiate analgesic: Secondary | ICD-10-CM

## 2022-05-21 DIAGNOSIS — D571 Sickle-cell disease without crisis: Secondary | ICD-10-CM

## 2022-05-21 MED ORDER — OXYCODONE HCL 10 MG PO TABS: 10.0000 mg | ORAL_TABLET | Freq: Four times a day (QID) | ORAL | 0 refills | Status: DC | PRN

## 2022-05-21 NOTE — Progress Notes (Signed)
Reviewed PDMP substance reporting system prior to prescribing opiate medications. No inconsistencies noted.  Meds ordered this encounter  Medications   Oxycodone HCl 10 MG TABS    Sig: Take 1 tablet (10 mg total) by mouth every 6 (six) hours as needed (pain).    Dispense:  60 tablet    Refill:  0    Order Specific Question:   Supervising Provider    Answer:   [name], [name] [[id number]]   Carmen [name] Carmen Inda  APRN, MSN, FNP-C [facility] [address]  [place], [place] [address] [phone]  

## 2022-05-27 ENCOUNTER — Telehealth: Payer: Self-pay | Admitting: Family Medicine

## 2022-05-27 ENCOUNTER — Telehealth: Payer: Self-pay

## 2022-05-27 NOTE — Telephone Encounter (Addendum)
PA approved Oxycodone HCL 10 MG  Confirmation #:[id number] WBenefit Plan:MCAIDHealth Plan:NCXIX Prior Approval #:[id number] PA Type:PHARMACYRecipient:Layli R SIMMONSRecipient WY:[id number] OBilling Provider:Billing Provider YD:[id number] Provider Name:THE [place] Provider HM:[id number] Submission Date:08/01/2023Status:APPROVEDEffective Begin Date:08/01/2023Effective End Date:01/28/2024Payer:[place] [employer] HEALTH BENEFITS# of Attachments:1

## 2022-05-27 NOTE — Telephone Encounter (Signed)
Prior Josem Kaufmann was approved for patient's [facility] [address] but the pharmacist informed me that they will not have any Oxycodone in stock until after the first of August. He said that [facility] [address] [facility] the [facility] in stock at this time.  Are you able to send to this pharmacy?

## 2022-05-28 ENCOUNTER — Other Ambulatory Visit: Payer: Self-pay | Admitting: Family Medicine

## 2022-05-28 DIAGNOSIS — Z79891 Long term (current) use of opiate analgesic: Secondary | ICD-10-CM

## 2022-05-28 DIAGNOSIS — D571 Sickle-cell disease without crisis: Secondary | ICD-10-CM

## 2022-05-28 MED ORDER — OXYCODONE HCL 10 MG PO TABS: 10.0000 mg | ORAL_TABLET | Freq: Four times a day (QID) | ORAL | 0 refills | Status: DC | PRN

## 2022-05-28 NOTE — Progress Notes (Signed)
Pharmacy changed due to lack of inventory  Meds ordered this encounter  Medications   Oxycodone HCl 10 MG TABS    Sig: Take 1 tablet (10 mg total) by mouth every 6 (six) hours as needed (pain).    Dispense:  60 tablet    Refill:  0    Order Specific Question:   Supervising Provider    Answer:   Tresa Garter [id number]

## 2022-05-30 ENCOUNTER — Other Ambulatory Visit: Payer: Self-pay | Admitting: Family Medicine

## 2022-05-30 DIAGNOSIS — M545 Low back pain, unspecified: Secondary | ICD-10-CM

## 2022-05-31 ENCOUNTER — Telehealth: Payer: Self-pay | Admitting: Family Medicine

## 2022-05-31 NOTE — Telephone Encounter (Signed)
Oxycoodne needs PA H[place]

## 2022-06-03 NOTE — Progress Notes (Signed)
This encounter was created in error - please disregard.

## 2022-06-10 ENCOUNTER — Emergency Department (HOSPITAL_COMMUNITY)
Admission: EM | Admit: 2022-06-10 | Discharge: 2022-06-10 | Disposition: A | Discharge status: Home / Self Care | Payer: Medicaid Other | Attending: Emergency Medicine | Admitting: Emergency Medicine

## 2022-06-10 ENCOUNTER — Encounter (HOSPITAL_COMMUNITY): Payer: Self-pay

## 2022-06-10 DIAGNOSIS — D57219 Sickle-cell/Hb-C disease with crisis, unspecified: Secondary | ICD-10-CM | POA: Diagnosis not present

## 2022-06-10 DIAGNOSIS — M25511 Pain in right shoulder: Secondary | ICD-10-CM | POA: Diagnosis present

## 2022-06-10 DIAGNOSIS — D5700 Hb-SS disease with crisis, unspecified: Secondary | ICD-10-CM

## 2022-06-10 LAB — CBC WITH DIFFERENTIAL/PLATELET
Abs Immature Granulocytes: 0.02 10*3/uL (ref 0.00–0.07)
Basophils Absolute: 0.1 10*3/uL (ref 0.0–0.1)
Basophils Relative: 1 %
Eosinophils Absolute: 0.2 10*3/uL (ref 0.0–0.5)
Eosinophils Relative: 2 %
HCT: 32.8 % — ABNORMAL LOW (ref 36.0–46.0)
Hemoglobin: 11.7 g/dL — ABNORMAL LOW (ref 12.0–15.0)
Immature Granulocytes: 0 %
Lymphocytes Relative: 32 %
Lymphs Abs: 2.8 10*3/uL (ref 0.7–4.0)
MCH: 29.6 pg (ref 26.0–34.0)
MCHC: 35.7 g/dL (ref 30.0–36.0)
MCV: 83 fL (ref 80.0–100.0)
Monocytes Absolute: 1 10*3/uL (ref 0.1–1.0)
Monocytes Relative: 12 %
Neutro Abs: 4.8 10*3/uL (ref 1.7–7.7)
Neutrophils Relative %: 53 %
Platelets: 522 10*3/uL — ABNORMAL HIGH (ref 150–400)
RBC: 3.95 MIL/uL (ref 3.87–5.11)
RDW: 13.2 % (ref 11.5–15.5)
WBC: 8.9 10*3/uL (ref 4.0–10.5)
nRBC: 0 % (ref 0.0–0.2)

## 2022-06-10 LAB — RETICULOCYTES
Immature Retic Fract: 31.4 % — ABNORMAL HIGH (ref 2.3–15.9)
RBC.: 3.97 MIL/uL (ref 3.87–5.11)
Retic Count, Absolute: 138.6 10*3/uL (ref 19.0–186.0)
Retic Ct Pct: 3.5 % — ABNORMAL HIGH (ref 0.4–3.1)

## 2022-06-10 LAB — COMPREHENSIVE METABOLIC PANEL
ALT: 41 U/L (ref 0–44)
AST: 37 U/L (ref 15–41)
Albumin: 4.3 g/dL (ref 3.5–5.0)
Alkaline Phosphatase: 65 U/L (ref 38–126)
Anion gap: 8 (ref 5–15)
BUN: 13 mg/dL (ref 6–20)
CO2: 19 mmol/L — ABNORMAL LOW (ref 22–32)
Calcium: 9.5 mg/dL (ref 8.9–10.3)
Chloride: 112 mmol/L — ABNORMAL HIGH (ref 98–111)
Creatinine, Ser: 0.66 mg/dL (ref 0.44–1.00)
GFR, Estimated: >60 mL/min (ref >60)
Glucose, Bld: 97 mg/dL (ref 70–99)
Potassium: 4.2 mmol/L (ref 3.5–5.1)
Sodium: 139 mmol/L (ref 135–145)
Total Bilirubin: 1 mg/dL (ref 0.3–1.2)
Total Protein: 8.5 g/dL — ABNORMAL HIGH (ref 6.5–8.1)

## 2022-06-10 LAB — I-STAT BETA HCG BLOOD, ED (MC, WL, AP ONLY): I-stat hCG, quantitative: <5 m[IU]/mL (ref <5)

## 2022-06-10 MED ORDER — HYDROMORPHONE HCL 2 MG/ML IJ SOLN
2.0000 mg | INTRAMUSCULAR | Status: AC
  Administered 2022-06-10: 2 mg via INTRAVENOUS
  Filled 2022-06-10: qty 1

## 2022-06-10 MED ORDER — ONDANSETRON HCL 4 MG/2ML IJ SOLN: 4.0000 mg | INTRAMUSCULAR | Status: DC | PRN

## 2022-06-10 MED ORDER — SODIUM CHLORIDE 0.45 % IV SOLN: INTRAVENOUS | Status: DC

## 2022-06-10 MED ORDER — OXYCODONE HCL 5 MG PO TABS
10.0000 mg | ORAL_TABLET | Freq: Once | ORAL | Status: AC
  Administered 2022-06-10: 10 mg via ORAL
  Filled 2022-06-10: qty 2

## 2022-06-10 MED ORDER — KETOROLAC TROMETHAMINE 15 MG/ML IJ SOLN
15.0000 mg | INTRAMUSCULAR | Status: AC
  Administered 2022-06-10: 15 mg via INTRAVENOUS
  Filled 2022-06-10: qty 1

## 2022-06-10 MED ORDER — DIPHENHYDRAMINE HCL 25 MG PO CAPS
25.0000 mg | ORAL_CAPSULE | ORAL | Status: DC | PRN
  Administered 2022-06-10: 25 mg via ORAL
  Filled 2022-06-10: qty 1

## 2022-06-10 NOTE — ED Triage Notes (Signed)
Pt c/o ongoing sickle cell pain in her bilateral legs and shoulders for approximately one week. Unrelieved by PO PRN medications at home. Pt denies CP, SOB.

## 2022-06-10 NOTE — ED Provider Notes (Signed)
[facility] DEPT Provider Note   CSN: [id number] Arrival date & time: 06/10/22  0932     History  Chief Complaint  Patient presents with   Sickle Cell Pain Crisis    Carmen [name] is a 31 y.o. female.  Patient presents to the emergency department today for evaluation of sickle cell anemia pain crisis.  Patient has a history of sickle cell anemia and is currently on extended release morphine and IR oxycodone.  She states that over the past 1 week she has had increasing pain in her shoulders, upper arms, and her lower legs below her knees.  No joint swelling.  No fevers.  No chest pain or shortness of breath.  No abdominal pain, nausea, vomiting, or diarrhea.  No urinary symptoms.  States that she is under stress but denies other triggers.      Home Medications Prior to Admission medications   Medication Sig Start Date End Date Taking? Authorizing Provider  betamethasone valerate ointment (VALISONE) 0.1 % APPLY TO AFFECTED AREA TWICE A DAY Patient not taking: Reported on 05/10/2022 04/07/22   [name], FNP  cyclobenzaprine (FLEXERIL) 10 MG tablet TAKE 1 TABLET BY MOUTH DAILY AS NEEDED FOR MUSCLE SPASMS. 05/31/22   [name], FNP  DULoxetine (CYMBALTA) 30 MG capsule Take 2 capsules (60 mg total) by mouth daily. Patient not taking: Reported on 05/10/2022 02/04/22 08/03/22  [name], FNP  folic acid (FOLVITE) 1 MG tablet Take 1 tablet (1 mg total) by mouth daily. 03/15/19   Lanae Boast, FNP  morphine (MS CONTIN) 60 MG 12 hr tablet Take 1 tablet (60 mg total) by mouth every 12 (twelve) hours. Patient taking differently: Take 60 mg by mouth every 12 (twelve) hours as needed for pain. 05/13/22   [name], FNP  Oxycodone HCl 10 MG TABS Take 1 tablet (10 mg total) by mouth every 6 (six) hours as needed (pain). 05/28/22   [name], FNP  Vitamin D, Ergocalciferol, (DRISDOL) 1.25 MG (50000 UNIT) CAPS capsule TAKE ONE CAPSULE BY  MOUTH ONE TIME PER WEEK Patient taking differently: Take 50,000 Units by mouth every Wednesday. 09/03/20   Vevelyn Francois, NP      Allergies    Ketamine    Review of Systems   Review of Systems  Physical Exam Updated Vital Signs BP (!) 86/72   Pulse 85   Temp 99 F (37.2 C) (Oral)   Resp 18   Ht '5\' 3"'$  (1.6 m)   Wt 83.9 kg   LMP 04/20/2022 (Within Weeks)   SpO2 99%   BMI 32.77 kg/m   Physical Exam Vitals and nursing note reviewed.  Constitutional:      General: She is not in acute distress.    Appearance: She is well-developed.  HENT:     Head: Normocephalic and atraumatic.     Right Ear: External ear normal.     Left Ear: External ear normal.     Nose: Nose normal.     Mouth/Throat:     Mouth: Mucous membranes are moist.  Eyes:     Conjunctiva/sclera: Conjunctivae normal.  Cardiovascular:     Rate and Rhythm: Normal rate and regular rhythm.     Heart sounds: No murmur heard. Pulmonary:     Effort: No respiratory distress.     Breath sounds: No wheezing, rhonchi or rales.  Abdominal:     Palpations: Abdomen is soft.     Tenderness: There is  no abdominal tenderness. There is no guarding or rebound.  Musculoskeletal:     Cervical back: Normal range of motion and neck supple.     Right lower leg: No edema.     Left lower leg: No edema.     Comments: No joint swelling of the upper or lower extremities.  She moves her extremities well without any difficulties.  Skin:    General: Skin is warm and dry.     Findings: No rash.  Neurological:     General: No focal deficit present.     Mental Status: She is alert. Mental status is at baseline.     Motor: No weakness.  Psychiatric:        Mood and Affect: Mood normal.    ED Results / Procedures / Treatments   Labs (all labs ordered are listed, but only abnormal results are displayed) Labs Reviewed  COMPREHENSIVE METABOLIC PANEL - Abnormal; Notable for the following components:      Result Value   Chloride 112  (*)    CO2 19 (*)    Total Protein 8.5 (*)    All other components within normal limits  CBC WITH DIFFERENTIAL/PLATELET - Abnormal; Notable for the following components:   Hemoglobin 11.7 (*)    HCT 32.8 (*)    Platelets 522 (*)    All other components within normal limits  RETICULOCYTES - Abnormal; Notable for the following components:   Retic Ct Pct 3.5 (*)    Immature Retic Fract 31.4 (*)    All other components within normal limits  I-STAT BETA HCG BLOOD, ED ([facility], [facility], [facility] ONLY)    EKG None  Radiology No results found.  Procedures Procedures    Medications Ordered in ED Medications  0.45 % sodium chloride infusion ( Intravenous New Bag/Given 06/10/22 1233)  diphenhydrAMINE (BENADRYL) capsule 25-50 mg (25 mg Oral Given 06/10/22 1230)  ondansetron (ZOFRAN) injection 4 mg (has no administration in time range)  ketorolac (TORADOL) 15 MG/ML injection 15 mg (15 mg Intravenous Given 06/10/22 1234)  HYDROmorphone (DILAUDID) injection 2 mg (2 mg Intravenous Given 06/10/22 1231)  HYDROmorphone (DILAUDID) injection 2 mg (2 mg Intravenous Given 06/10/22 1306)  HYDROmorphone (DILAUDID) injection 2 mg (2 mg Intravenous Given 06/10/22 1349)  oxyCODONE (Oxy IR/ROXICODONE) immediate release tablet 10 mg (10 mg Oral Given 06/10/22 1511)    ED Course/ Medical Decision Making/ A&P    Patient seen and examined. History obtained directly from patient. Work-up including labs, imaging, EKG ordered in triage, if performed, were reviewed.    Labs/EKG: Independently reviewed and interpreted.  This included: CBC with normal white blood cell count, hemoglobin higher than baseline today at 11.7; CMP with normal electrolytes, normal kidney function, bili 1.0; reticulocytes with normal counts; pregnancy negative.  Imaging: None ordered, patient denies fever, chest pain, shortness of breath.  No concern for acute chest syndrome.  Medications/Fluids: Ordered: IV Dilaudid, IV fluids, p.o. Benadryl.  Most  recent vital signs reviewed and are as follows: BP (!) 86/72   Pulse 85   Temp 99 F (37.2 C) (Oral)   Resp 18   Ht '5\' 3"'$  (1.6 m)   Wt 83.9 kg   LMP 04/20/2022 (Within Weeks)   SpO2 99%   BMI 32.77 kg/m   Initial impression: Sickle cell anemia, chronic pain  3:28 PM Reassessment performed. Patient appears comfortable.  Sitting up in bed.  States that the pain in her upper body is much improved.  She continues to have 7  out of 10 pain in her lower extremities.  Discussed admission versus discharge to home with continued home pain medications.  Patient prefers to be discharged today.  She feels that her pain is well enough controlled to take her home medications and will like to try this.  I think this is reasonable.  She will be given a dose of her home oxycodone prior to discharge.  Most current vital signs reviewed and are as follows: BP (!) 128/93 (BP Location: Left Arm)   Pulse 95   Temp 98.9 F (37.2 C) (Oral)   Resp 18   Ht '5\' 3"'$  (1.6 m)   Wt 83.9 kg   LMP 04/20/2022 (Within Weeks)   SpO2 99%   BMI 32.77 kg/m   Plan: Discharge to home.   Prescriptions written for: None  Other home care instructions discussed: Avoidance of triggers, continued home pain regimen  ED return instructions discussed: Uncontrolled pain, chest pain, shortness of breath, fever.  Follow-up instructions discussed: Patient encouraged to follow-up with their sickle cell provider in 2 days.                          Medical Decision Making Amount and/or Complexity of Data Reviewed Labs: ordered.  Risk Prescription drug management.  Patient presenting today with upper body and lower extremity pain in setting of sickle cell anemia.  No associated joint pain, swelling, hemarthrosis, signs of septic arthritis.  Lab work without significant anemia, actually above baseline today.  No chest pain or shortness of breath to suggest pneumonia or acute chest syndrome.  No abdominal pain or vomiting.   Patient appears well-hydrated.  Suspect uncomplicated vaso-occlusive crisis, symptoms clinically improved after treatment in the ED.  The patient's vital signs, pertinent lab work and imaging were reviewed and interpreted as discussed in the ED course. Hospitalization was considered for further testing, treatments, or serial exams/observation. However as patient is well-appearing, has a stable exam, and reassuring studies today, I do not feel that they warrant admission at this time. This plan was discussed with the patient who verbalizes agreement and comfort with this plan and seems reliable and able to return to the Emergency Department with worsening or changing symptoms.          Final Clinical Impression(s) / ED Diagnoses Final diagnoses:  Sickle cell anemia with pain [facility])    Rx / DC Orders ED Discharge Orders     None         Carlisle Cater, PA-C 06/10/22 1531    Cristie Hem, MD 06/10/22 [phone]

## 2022-06-10 NOTE — ED Notes (Signed)
Patient given discharge instructions, all questions answered. Patient in possession of all belongings, directed to the discharge area  

## 2022-06-10 NOTE — ED Notes (Signed)
Pt requesting blood draw to be performed when she is placed in a room.

## 2022-06-10 NOTE — Discharge Instructions (Addendum)
Please read and follow all provided instructions.  Your diagnoses today include:  1. Sickle cell anemia with pain (HCC)    Tests performed today include: Complete blood cell count: Blood counts and hemoglobin look good today. Complete metabolic panel: no problems Vital signs. See below for your results today.   Medications prescribed:  None  Take any prescribed medications only as directed.  Home care instructions:  Follow any educational materials contained in this packet.  BE VERY CAREFUL not to take multiple medicines containing Tylenol (also called acetaminophen). Doing so can lead to an overdose which can damage your liver and cause liver failure and possibly death.   Follow-up instructions: Please follow-up with your primary care provider in the next 3 days for further evaluation of your symptoms.   Return instructions:  Please return to the Emergency Department if you experience worsening symptoms.  Please return if you have any other emergent concerns.  Additional Information:  Your vital signs today were: BP 107/74   Pulse 79   Temp 99 F (37.2 C) (Oral)   Resp 18   Ht '5\' 3"'$  (1.6 m)   Wt 83.9 kg   LMP 04/20/2022 (Within Weeks)   SpO2 99%   BMI 32.77 kg/m  If your blood pressure (BP) was elevated above 135/85 this visit, please have this repeated by your doctor within one month. --------------

## 2022-06-14 ENCOUNTER — Other Ambulatory Visit: Payer: Self-pay | Admitting: Family Medicine

## 2022-06-14 DIAGNOSIS — Z79891 Long term (current) use of opiate analgesic: Secondary | ICD-10-CM

## 2022-06-14 DIAGNOSIS — D571 Sickle-cell disease without crisis: Secondary | ICD-10-CM

## 2022-06-14 MED ORDER — MORPHINE SULFATE ER 60 MG PO TBCR: 60.0000 mg | EXTENDED_RELEASE_TABLET | Freq: Two times a day (BID) | ORAL | 0 refills | Status: DC

## 2022-06-14 NOTE — Progress Notes (Signed)
Reviewed PDMP substance reporting system prior to prescribing opiate medications. No inconsistencies noted.  Meds ordered this encounter  Medications   morphine (MS CONTIN) 60 MG 12 hr tablet    Sig: Take 1 tablet (60 mg total) by mouth every 12 (twelve) hours.    Dispense:  60 tablet    Refill:  0    Order Specific Question:   Supervising Provider    Answer:   [name], [name] [[id number]]     Carmen [name] Carmen Menachem Urbanek  APRN, MSN, FNP-C [facility] [address]  [place], [place] [address] [phone]  

## 2022-06-15 ENCOUNTER — Other Ambulatory Visit: Payer: Self-pay | Admitting: Family Medicine

## 2022-06-15 ENCOUNTER — Telehealth: Payer: Self-pay

## 2022-06-15 DIAGNOSIS — Z79891 Long term (current) use of opiate analgesic: Secondary | ICD-10-CM

## 2022-06-15 DIAGNOSIS — D571 Sickle-cell disease without crisis: Secondary | ICD-10-CM

## 2022-06-15 MED ORDER — OXYCODONE HCL 10 MG PO TABS: 10.0000 mg | ORAL_TABLET | Freq: Four times a day (QID) | ORAL | 0 refills | Status: DC | PRN

## 2022-06-15 NOTE — Telephone Encounter (Signed)
[facility]

## 2022-06-15 NOTE — Progress Notes (Signed)
Reviewed PDMP substance reporting system prior to prescribing opiate medications. No inconsistencies noted.  Meds ordered this encounter  Medications   Oxycodone HCl 10 MG TABS    Sig: Take 1 tablet (10 mg total) by mouth every 6 (six) hours as needed (pain).    Dispense:  60 tablet    Refill:  0    Order Specific Question:   Supervising Provider    Answer:   [name], [name] [[id number]]   Carmen Tusing Moore Shannan Slinker  APRN, MSN, FNP-C [facility] [address]  , [place] [address] [phone]  

## 2022-06-16 ENCOUNTER — Other Ambulatory Visit: Payer: Self-pay | Admitting: Family Medicine

## 2022-06-16 DIAGNOSIS — D571 Sickle-cell disease without crisis: Secondary | ICD-10-CM

## 2022-06-16 DIAGNOSIS — Z79891 Long term (current) use of opiate analgesic: Secondary | ICD-10-CM

## 2022-06-16 MED ORDER — MORPHINE SULFATE ER 60 MG PO TBCR: 60.0000 mg | EXTENDED_RELEASE_TABLET | Freq: Two times a day (BID) | ORAL | 0 refills | Status: DC

## 2022-06-16 NOTE — Progress Notes (Signed)
Reviewed PDMP substance reporting system prior to prescribing opiate medications. No inconsistencies noted.  Meds ordered this encounter  Medications   morphine (MS CONTIN) 60 MG 12 hr tablet    Sig: Take 1 tablet (60 mg total) by mouth every 12 (twelve) hours.    Dispense:  60 tablet    Refill:  0    Order Specific Question:   Supervising Provider    Answer:   [name], [name] [[id number]]     Ladasha Schnackenberg Moore Brihany Butch  APRN, MSN, FNP-C [facility] [address]  [place], [place] [address] [phone]  

## 2022-07-06 ENCOUNTER — Telehealth: Payer: Self-pay

## 2022-07-06 NOTE — Telephone Encounter (Signed)
Oxycodone  °

## 2022-07-07 ENCOUNTER — Other Ambulatory Visit: Payer: Self-pay | Admitting: Family Medicine

## 2022-07-07 ENCOUNTER — Other Ambulatory Visit (HOSPITAL_COMMUNITY): Payer: Self-pay | Admitting: Family Medicine

## 2022-07-07 DIAGNOSIS — Z79891 Long term (current) use of opiate analgesic: Secondary | ICD-10-CM

## 2022-07-07 DIAGNOSIS — D571 Sickle-cell disease without crisis: Secondary | ICD-10-CM

## 2022-07-07 MED ORDER — OXYCODONE HCL 10 MG PO TABS: 10.0000 mg | ORAL_TABLET | Freq: Four times a day (QID) | ORAL | 0 refills | Status: DC | PRN

## 2022-07-07 NOTE — Progress Notes (Signed)
Reviewed PDMP substance reporting system prior to prescribing opiate medications. No inconsistencies noted.  Meds ordered this encounter  Medications   Oxycodone HCl 10 MG TABS    Sig: Take 1 tablet (10 mg total) by mouth every 6 (six) hours as needed (pain).    Dispense:  60 tablet    Refill:  0    Order Specific Question:   Supervising Provider    Answer:   [name], [name] [[id number]]   Gareth Fitzner Moore Ticia Virgo  APRN, MSN, FNP-C [facility] [address]  [place],  [address] [phone]  

## 2022-07-07 NOTE — Progress Notes (Signed)
Pharmacy changed due to issues with oxycodone stock  [name]  APRN, MSN, FNP-C [facility] [address] [place], [place] [address] [phone]

## 2022-07-07 NOTE — Telephone Encounter (Signed)
Spoke with [facility] [address] [address] to cancel rx for Oxy '10mg'$ , as they were out of this medication and pt has [facility]. Rx canceled per pharmacist.

## 2022-07-14 ENCOUNTER — Inpatient Hospital Stay (HOSPITAL_COMMUNITY)
Admission: EM | Admit: 2022-07-14 | Discharge: 2022-07-19 | DRG: 812 | Disposition: A | Discharge status: Home / Self Care | Payer: Medicaid Other | Attending: Internal Medicine | Admitting: Internal Medicine

## 2022-07-14 ENCOUNTER — Other Ambulatory Visit: Payer: Self-pay

## 2022-07-14 ENCOUNTER — Encounter (HOSPITAL_COMMUNITY): Payer: Self-pay | Admitting: Pharmacy Technician

## 2022-07-14 DIAGNOSIS — F17210 Nicotine dependence, cigarettes, uncomplicated: Secondary | ICD-10-CM | POA: Diagnosis present

## 2022-07-14 DIAGNOSIS — D638 Anemia in other chronic diseases classified elsewhere: Secondary | ICD-10-CM | POA: Diagnosis present

## 2022-07-14 DIAGNOSIS — Z8261 Family history of arthritis: Secondary | ICD-10-CM | POA: Diagnosis not present

## 2022-07-14 DIAGNOSIS — F419 Anxiety disorder, unspecified: Secondary | ICD-10-CM | POA: Diagnosis present

## 2022-07-14 DIAGNOSIS — Z79891 Long term (current) use of opiate analgesic: Secondary | ICD-10-CM | POA: Diagnosis not present

## 2022-07-14 DIAGNOSIS — Z841 Family history of disorders of kidney and ureter: Secondary | ICD-10-CM | POA: Diagnosis not present

## 2022-07-14 DIAGNOSIS — D571 Sickle-cell disease without crisis: Secondary | ICD-10-CM

## 2022-07-14 DIAGNOSIS — Z79899 Other long term (current) drug therapy: Secondary | ICD-10-CM

## 2022-07-14 DIAGNOSIS — D5700 Hb-SS disease with crisis, unspecified: Secondary | ICD-10-CM | POA: Diagnosis not present

## 2022-07-14 DIAGNOSIS — Z832 Family history of diseases of the blood and blood-forming organs and certain disorders involving the immune mechanism: Secondary | ICD-10-CM | POA: Diagnosis not present

## 2022-07-14 DIAGNOSIS — F1120 Opioid dependence, uncomplicated: Secondary | ICD-10-CM | POA: Diagnosis present

## 2022-07-14 DIAGNOSIS — Z23 Encounter for immunization: Secondary | ICD-10-CM

## 2022-07-14 DIAGNOSIS — Z8249 Family history of ischemic heart disease and other diseases of the circulatory system: Secondary | ICD-10-CM | POA: Diagnosis not present

## 2022-07-14 DIAGNOSIS — G894 Chronic pain syndrome: Secondary | ICD-10-CM | POA: Diagnosis present

## 2022-07-14 DIAGNOSIS — Z888 Allergy status to other drugs, medicaments and biological substances status: Secondary | ICD-10-CM | POA: Diagnosis not present

## 2022-07-14 DIAGNOSIS — F329 Major depressive disorder, single episode, unspecified: Secondary | ICD-10-CM | POA: Diagnosis present

## 2022-07-14 LAB — COMPREHENSIVE METABOLIC PANEL
ALT: 24 U/L (ref 0–44)
AST: 30 U/L (ref 15–41)
Albumin: 4.5 g/dL (ref 3.5–5.0)
Alkaline Phosphatase: 65 U/L (ref 38–126)
Anion gap: 8 (ref 5–15)
BUN: 10 mg/dL (ref 6–20)
CO2: 20 mmol/L — ABNORMAL LOW (ref 22–32)
Calcium: 9.8 mg/dL (ref 8.9–10.3)
Chloride: 112 mmol/L — ABNORMAL HIGH (ref 98–111)
Creatinine, Ser: 0.58 mg/dL (ref 0.44–1.00)
GFR, Estimated: >60 mL/min (ref >60)
Glucose, Bld: 104 mg/dL — ABNORMAL HIGH (ref 70–99)
Potassium: 4 mmol/L (ref 3.5–5.1)
Sodium: 140 mmol/L (ref 135–145)
Total Bilirubin: 0.9 mg/dL (ref 0.3–1.2)
Total Protein: 8.9 g/dL — ABNORMAL HIGH (ref 6.5–8.1)

## 2022-07-14 LAB — CBC WITH DIFFERENTIAL/PLATELET
Abs Immature Granulocytes: 0.02 10*3/uL (ref 0.00–0.07)
Basophils Absolute: 0 10*3/uL (ref 0.0–0.1)
Basophils Relative: 0 %
Eosinophils Absolute: 0.1 10*3/uL (ref 0.0–0.5)
Eosinophils Relative: 1 %
HCT: 31.8 % — ABNORMAL LOW (ref 36.0–46.0)
Hemoglobin: 11.4 g/dL — ABNORMAL LOW (ref 12.0–15.0)
Immature Granulocytes: 0 %
Lymphocytes Relative: 23 %
Lymphs Abs: 2 10*3/uL (ref 0.7–4.0)
MCH: 29.6 pg (ref 26.0–34.0)
MCHC: 35.8 g/dL (ref 30.0–36.0)
MCV: 82.6 fL (ref 80.0–100.0)
Monocytes Absolute: 0.9 10*3/uL (ref 0.1–1.0)
Monocytes Relative: 11 %
Neutro Abs: 5.9 10*3/uL (ref 1.7–7.7)
Neutrophils Relative %: 65 %
Platelets: 561 10*3/uL — ABNORMAL HIGH (ref 150–400)
RBC: 3.85 MIL/uL — ABNORMAL LOW (ref 3.87–5.11)
RDW: 12.8 % (ref 11.5–15.5)
WBC: 9 10*3/uL (ref 4.0–10.5)
nRBC: 0 % (ref 0.0–0.2)

## 2022-07-14 LAB — RETICULOCYTES
Immature Retic Fract: 27.2 % — ABNORMAL HIGH (ref 2.3–15.9)
RBC.: 3.84 MIL/uL — ABNORMAL LOW (ref 3.87–5.11)
Retic Count, Absolute: 128.6 10*3/uL (ref 19.0–186.0)
Retic Ct Pct: 3.4 % — ABNORMAL HIGH (ref 0.4–3.1)

## 2022-07-14 MED ORDER — POLYETHYLENE GLYCOL 3350 17 G PO PACK
17.0000 g | PACK | Freq: Every day | ORAL | Status: DC | PRN
  Administered 2022-07-18: 17 g via ORAL
  Filled 2022-07-14: qty 1

## 2022-07-14 MED ORDER — ONDANSETRON HCL 4 MG/2ML IJ SOLN
4.0000 mg | Freq: Four times a day (QID) | INTRAMUSCULAR | Status: DC | PRN
  Administered 2022-07-14 – 2022-07-15 (×2): 4 mg via INTRAVENOUS
  Filled 2022-07-14 (×3): qty 2

## 2022-07-14 MED ORDER — ONDANSETRON HCL 4 MG/2ML IJ SOLN
4.0000 mg | INTRAMUSCULAR | Status: DC | PRN
  Administered 2022-07-14: 4 mg via INTRAVENOUS
  Filled 2022-07-14: qty 2

## 2022-07-14 MED ORDER — HYDROMORPHONE HCL 2 MG/ML IJ SOLN
2.0000 mg | INTRAMUSCULAR | Status: AC
  Administered 2022-07-14: 2 mg via INTRAVENOUS
  Filled 2022-07-14: qty 1

## 2022-07-14 MED ORDER — SODIUM CHLORIDE 0.45 % IV SOLN: INTRAVENOUS | Status: DC

## 2022-07-14 MED ORDER — SODIUM CHLORIDE 0.9% FLUSH: 9.0000 mL | INTRAVENOUS | Status: DC | PRN

## 2022-07-14 MED ORDER — DULOXETINE HCL 60 MG PO CPEP
60.0000 mg | ORAL_CAPSULE | Freq: Every day | ORAL | Status: DC
  Administered 2022-07-14 – 2022-07-19 (×6): 60 mg via ORAL
  Filled 2022-07-14 (×6): qty 1

## 2022-07-14 MED ORDER — HYDROMORPHONE HCL 2 MG/ML IJ SOLN: 2.0000 mg | INTRAMUSCULAR | Status: DC | PRN

## 2022-07-14 MED ORDER — INFLUENZA VAC SPLIT QUAD 0.5 ML IM SUSY
0.5000 mL | PREFILLED_SYRINGE | INTRAMUSCULAR | Status: AC
  Administered 2022-07-15: 0.5 mL via INTRAMUSCULAR
  Filled 2022-07-14: qty 0.5

## 2022-07-14 MED ORDER — ENOXAPARIN SODIUM 40 MG/0.4ML IJ SOSY
40.0000 mg | PREFILLED_SYRINGE | INTRAMUSCULAR | Status: DC
  Filled 2022-07-14: qty 0.4

## 2022-07-14 MED ORDER — SENNOSIDES-DOCUSATE SODIUM 8.6-50 MG PO TABS
1.0000 | ORAL_TABLET | Freq: Two times a day (BID) | ORAL | Status: DC
  Administered 2022-07-14 – 2022-07-19 (×10): 1 via ORAL
  Filled 2022-07-14 (×10): qty 1

## 2022-07-14 MED ORDER — KETOROLAC TROMETHAMINE 15 MG/ML IJ SOLN
15.0000 mg | Freq: Four times a day (QID) | INTRAMUSCULAR | Status: DC
  Administered 2022-07-14 – 2022-07-19 (×18): 15 mg via INTRAVENOUS
  Filled 2022-07-14 (×18): qty 1

## 2022-07-14 MED ORDER — NALOXONE HCL 0.4 MG/ML IJ SOLN: 0.4000 mg | INTRAMUSCULAR | Status: DC | PRN

## 2022-07-14 MED ORDER — DIPHENHYDRAMINE HCL 25 MG PO CAPS: 25.0000 mg | ORAL_CAPSULE | ORAL | Status: DC | PRN

## 2022-07-14 MED ORDER — MORPHINE SULFATE ER 15 MG PO TBCR
60.0000 mg | EXTENDED_RELEASE_TABLET | Freq: Two times a day (BID) | ORAL | Status: DC
  Administered 2022-07-14 – 2022-07-19 (×10): 60 mg via ORAL
  Filled 2022-07-14 (×10): qty 4

## 2022-07-14 MED ORDER — SODIUM CHLORIDE 0.9 % IV SOLN
12.5000 mg | Freq: Once | INTRAVENOUS | Status: AC
  Administered 2022-07-14: 12.5 mg via INTRAVENOUS
  Filled 2022-07-14: qty 12.5

## 2022-07-14 MED ORDER — FOLIC ACID 1 MG PO TABS
1.0000 mg | ORAL_TABLET | Freq: Every day | ORAL | Status: DC
  Administered 2022-07-14 – 2022-07-19 (×6): 1 mg via ORAL
  Filled 2022-07-14 (×6): qty 1

## 2022-07-14 MED ORDER — HYDROMORPHONE 1 MG/ML IV SOLN
INTRAVENOUS | Status: DC
  Administered 2022-07-14: 7.6 mg via INTRAVENOUS
  Administered 2022-07-15: 1.2 mg via INTRAVENOUS
  Administered 2022-07-15: 1.8 mg via INTRAVENOUS
  Administered 2022-07-15: 3.6 mg via INTRAVENOUS
  Administered 2022-07-15: 6.6 mg via INTRAVENOUS
  Administered 2022-07-15: 7.2 mg via INTRAVENOUS
  Administered 2022-07-15: 6.6 mg via INTRAVENOUS
  Administered 2022-07-15: 7.2 mg via INTRAVENOUS
  Administered 2022-07-16: 4.8 mg via INTRAVENOUS
  Administered 2022-07-16: 3.6 mg via INTRAVENOUS
  Administered 2022-07-16: 6 mg via INTRAVENOUS
  Administered 2022-07-16: 7.2 mg via INTRAVENOUS
  Administered 2022-07-16: 4.8 mg via INTRAVENOUS
  Administered 2022-07-17: 7.8 mg via INTRAVENOUS
  Administered 2022-07-17: 9.6 mg via INTRAVENOUS
  Administered 2022-07-17: 5.4 mg via INTRAVENOUS
  Administered 2022-07-17: 3 mg via INTRAVENOUS
  Administered 2022-07-17: 6.6 mg via INTRAVENOUS
  Administered 2022-07-17: 10.2 mg via INTRAVENOUS
  Administered 2022-07-17: 6 mg via INTRAVENOUS
  Administered 2022-07-18: 7.5 mg via INTRAVENOUS
  Administered 2022-07-18: 6 mg via INTRAVENOUS
  Administered 2022-07-19: 8.4 mg via INTRAVENOUS
  Filled 2022-07-14 (×6): qty 30

## 2022-07-14 NOTE — ED Triage Notes (Signed)
Pt here with reports of SCC onset 3 days ago. Has been taking medication at home without relief.

## 2022-07-14 NOTE — H&P (Signed)
H&P  Patient Demographics:  Carmen [name], is a 31 y.o. female  MRN: [id number]   DOB - [birth year]  Admit Date - 07/14/2022  Outpatient Primary MD for the patient is [name], FNP  Chief Complaint  Patient presents with   Sickle Cell Pain Crisis      HPI:   Carmen [name]  is a 31 y.o. female with a medical history significant for sickle cell disease, chronic pain syndrome, opiate dependence and tolerance, history of anemia of chronic disease, and depression presents to the emergency department with complaints of pain to low back and lower extremities.  Patient's pain is consistent with previous sickle cell crisis.  Patient says that her pain intensity has been increased over the past several days and has been mostly unrelieved by her home medications.  She attributes crisis to changes in weather.  She last had oxycodone and MS Contin this a.m. without very much relief.  She currently rates pain as 9/10, constant, and aching.  She denies any fever, chills, chest pain, or shortness of breath.  No urinary symptoms, nausea, vomiting, or diarrhea.  No sick contacts, recent travel, or known exposure to COVID-19.  ER course: While in the ER, patient's vital signs remained stable.  Reviewed all laboratory values.  Comprehensive metabolic panel is unremarkable.  Patient's hemoglobin is 11.4, which is consistent with her baseline.  Pain intensity remained elevated despite IV Dilaudid and IV fluids.  Sickle cell team contacted and patient will be admitted to hospital for further management of sickle cell pain crisis.   Review of systems:  In addition to the HPI above, patient reports No fever or chills No Headache, No changes with vision or hearing No problems swallowing food or liquids No chest pain, cough or shortness of breath No abdominal pain, No nausea or vomiting, Bowel movements are regular No blood in stool or urine No dysuria No new skin rashes or bruises No new joints  pains-aches No new weakness, tingling, numbness in any extremity No recent weight gain or loss No polyuria, polydypsia or polyphagia No significant Mental Stressors  With Past History of the following :   Past Medical History:  Diagnosis Date   Anxiety    Headache(784.0)    Heart murmur    Sickle cell crisis ([place])    Syphilis 2015   Was diagnosed and received one injection of antibiotics   Thrombocytosis 11/22/2014     CBC Latest Ref Rng & Units 12/13/2018 12/11/2018 12/10/2018 WBC 4.0 - 10.5 K/uL 14.8(H) 13.2(H) 15.9(H) Hemoglobin 12.0 - 15.0 g/dL 8.2(L) 7.7(L) 8.1(L) Hematocrit 36.0 - 46.0 % 23.7(L) 23.2(L) 24.5(L) Platelets 150 - 400 K/uL 326 399 388        Past Surgical History:  Procedure Laterality Date   CESAREAN SECTION N/A 02/05/2019   Procedure: CESAREAN SECTION;  Surgeon: [name], MD;  Location: [facility];  Service: Obstetrics;  Laterality: N/A;   CHOLECYSTECTOMY N/A 11/30/2014   Procedure: LAPAROSCOPIC CHOLECYSTECTOMY SINGLE SITE WITH INTRAOPERATIVE CHOLANGIOGRAM;  Surgeon: Michael Boston, MD;  Location: [facility]S;  Service: General;  Laterality: N/A;   SPLENECTOMY       Social History:   Social History   Tobacco Use   Smoking status: Some Days    Types: Cigarettes   Smokeless tobacco: Never  Substance Use Topics   Alcohol use: No     Lives - At home   Family History :   Family History  Problem Relation Age of Onset  Hypertension Mother    Sickle cell anemia Sister    Kidney disease Sister        Lupus   Arthritis Sister    Sickle cell anemia Sister    Sickle cell trait Sister    Heart disease Maternal Aunt        CABG   Heart disease Maternal Uncle        CABG   Lupus Sister      Home Medications:   Prior to Admission medications   Medication Sig Start Date End Date Taking? Authorizing Provider  betamethasone valerate ointment (VALISONE) 0.1 % APPLY TO AFFECTED AREA TWICE A DAY Patient not taking: Reported on 05/10/2022 04/07/22   [name], FNP  cyclobenzaprine (FLEXERIL) 10 MG tablet TAKE 1 TABLET BY MOUTH DAILY AS NEEDED FOR MUSCLE SPASMS. 05/31/22   [name], FNP  DULoxetine (CYMBALTA) 30 MG capsule Take 2 capsules (60 mg total) by mouth daily. Patient not taking: Reported on 05/10/2022 02/04/22 08/03/22  [name], FNP  folic acid (FOLVITE) 1 MG tablet Take 1 tablet (1 mg total) by mouth daily. 03/15/19   Lanae Boast, FNP  morphine (MS CONTIN) 60 MG 12 hr tablet Take 1 tablet (60 mg total) by mouth every 12 (twelve) hours. 06/17/22   [name], FNP  Oxycodone HCl 10 MG TABS Take 1 tablet (10 mg total) by mouth every 6 (six) hours as needed (pain). 07/09/22   [name], FNP  Vitamin D, Ergocalciferol, (DRISDOL) 1.25 MG (50000 UNIT) CAPS capsule TAKE ONE CAPSULE BY MOUTH ONE TIME PER WEEK Patient taking differently: Take 50,000 Units by mouth every Wednesday. 09/03/20   Vevelyn Francois, NP     Allergies:   Allergies  Allergen Reactions   Ketamine Hives and Other (See Comments)    Makes the patient "feel funny" also     Physical Exam:   Vitals:   Vitals:   07/14/22 1210 07/14/22 1230  BP: 119/75 114/74  Pulse: 89 73  Resp: (!) 29 18  Temp:    SpO2: 97% 100%    Physical Exam: Constitutional: Patient appears well-developed and well-nourished. Not in obvious distress. HENT: Normocephalic, atraumatic, External right and left ear normal. Oropharynx is clear and moist.  Eyes: Conjunctivae and EOM are normal. PERRLA, no scleral icterus. Neck: Normal ROM. Neck supple. No JVD. No tracheal deviation. No thyromegaly. CVS: RRR, S1/S2 +, no murmurs, no gallops, no carotid bruit.  Pulmonary: Effort and breath sounds normal, no stridor, rhonchi, wheezes, rales.  Abdominal: Soft. BS +, no distension, tenderness, rebound or guarding.  Musculoskeletal: Normal range of motion. No edema and no tenderness.  Lymphadenopathy: No lymphadenopathy noted, cervical, inguinal or axillary Neuro: Alert.  Normal reflexes, muscle tone coordination. No cranial nerve deficit. Skin: Skin is warm and dry. No rash noted. Not diaphoretic. No erythema. No pallor. Psychiatric: Normal mood and affect. Behavior, judgment, thought content normal.   Data Review:   CBC Recent Labs  Lab 07/14/22 0930  WBC 9.0  HGB 11.4*  HCT 31.8*  PLT 561*  MCV 82.6  MCH 29.6  MCHC 35.8  RDW 12.8  LYMPHSABS 2.0  MONOABS 0.9  EOSABS 0.1  BASOSABS 0.0   ------------------------------------------------------------------------------------------------------------------  Chemistries  Recent Labs  Lab 07/14/22 0930  NA 140  K 4.0  CL 112*  CO2 20*  GLUCOSE 104*  BUN 10  CREATININE 0.58  CALCIUM 9.8  AST 30  ALT 24  ALKPHOS 65  BILITOT 0.9   ------------------------------------------------------------------------------------------------------------------  estimated creatinine clearance is 106.5 mL/min (by C-G formula based on SCr of 0.58 mg/dL). ------------------------------------------------------------------------------------------------------------------ No results for input(s): "TSH", "T4TOTAL", "T3FREE", "THYROIDAB" in the last 72 hours.  Invalid input(s): "FREET3"  Coagulation profile No results for input(s): "INR", "PROTIME" in the last 168 hours. ------------------------------------------------------------------------------------------------------------------- No results for input(s): "DDIMER" in the last 72 hours. -------------------------------------------------------------------------------------------------------------------  Cardiac Enzymes No results for input(s): "CKMB", "TROPONINI", "MYOGLOBIN" in the last 168 hours.  Invalid input(s): "CK" ------------------------------------------------------------------------------------------------------------------ No results found for:  "BNP"  ---------------------------------------------------------------------------------------------------------------  Urinalysis    Component Value Date/Time   COLORURINE YELLOW 03/30/2022 0301   APPEARANCEUR CLEAR 03/30/2022 0301   LABSPEC 1.005 03/30/2022 0301   PHURINE 6.0 03/30/2022 0301   GLUCOSEU NEGATIVE 03/30/2022 0301   HGBUR NEGATIVE 03/30/2022 0301   BILIRUBINUR NEGATIVE 03/30/2022 0301   BILIRUBINUR negative 08/26/2021 1527   BILIRUBINUR Negative 08/10/2019 1534   KETONESUR NEGATIVE 03/30/2022 0301   PROTEINUR NEGATIVE 03/30/2022 0301   UROBILINOGEN 1.0 08/26/2021 1527   UROBILINOGEN 1.0 01/12/2018 0943   NITRITE NEGATIVE 03/30/2022 0301   LEUKOCYTESUR NEGATIVE 03/30/2022 0301    ----------------------------------------------------------------------------------------------------------------   Imaging Results:    No results found.   Assessment & Plan:  Principal Problem:   Sickle cell pain crisis (HCC) Active Problems:   Anemia of chronic disease   Major depressive disorder   Chronic pain syndrome  Sickle cell disease with pain crisis: Continue IV fluids, 0.45% saline at 75 mL/h Initiate IV Dilaudid PCA Toradol 15 mg IV every 6 hours Continue patient's MS Contin 60 mg every 12 hours Hold oxycodone, will restart as pain intensity improves Monitor vital signs very closely, reevaluate pain scale regularly, and supplemental oxygen as needed. Patient will be reevaluated for pain in the context of function and relationship to baseline as care progresses  Chronic pain syndrome: Continue home medications  Major depression: Continue duloxetine 60 mg daily.  No suicidal ideations today.  Monitor closely.  Anemia of chronic disease Patient's hemoglobin is consistent with her baseline.  There is no clinical indication for blood transfusion at this time.  DVT Prophylaxis: Subcut Lovenox   AM Labs Ordered, also please review Full Orders  Family  Communication: Admission, patient's condition and plan of care including tests being ordered have been discussed with the patient who indicate understanding and agree with the plan and Code Status.  Code Status: Full Code  Consults called: None    Admission status: Inpatient    Time spent in minutes : 30 minutes   [place], MSN, FNP-C [facility] [address] [place], [place] [address] [phone]  07/14/2022 at 1:15 PM

## 2022-07-14 NOTE — ED Notes (Signed)
In to attemt IV blood draw and access for medications/ fluids. Unable to locate vein. Charge nurse informed of need for ultrasound assistance.

## 2022-07-14 NOTE — ED Provider Notes (Signed)
[facility] DEPT Provider Note   CSN: [id number] Arrival date & time: 07/14/22  9629     History  Chief Complaint  Patient presents with   Sickle Cell Pain Crisis    Carmen [name] is a 31 y.o. female.  31 year old female with history of sickle cell disease presents diffuse body pain consistent with prior vaso-occlusive crisis.  Denies any fever or chills.  No cough or shortness of breath.  No abdominal discomfort.  Pain is characterized as crampy and in her back and legs.  No urinary symptoms.  Used her home morphine without relief.  Thinks the weather may have triggered her current exacerbation       Home Medications Prior to Admission medications   Medication Sig Start Date End Date Taking? Authorizing Provider  betamethasone valerate ointment (VALISONE) 0.1 % APPLY TO AFFECTED AREA TWICE A DAY Patient not taking: Reported on 05/10/2022 04/07/22   [name], FNP  cyclobenzaprine (FLEXERIL) 10 MG tablet TAKE 1 TABLET BY MOUTH DAILY AS NEEDED FOR MUSCLE SPASMS. 05/31/22   [name], FNP  DULoxetine (CYMBALTA) 30 MG capsule Take 2 capsules (60 mg total) by mouth daily. Patient not taking: Reported on 05/10/2022 02/04/22 08/03/22  [name], FNP  folic acid (FOLVITE) 1 MG tablet Take 1 tablet (1 mg total) by mouth daily. 03/15/19   Lanae Boast, FNP  morphine (MS CONTIN) 60 MG 12 hr tablet Take 1 tablet (60 mg total) by mouth every 12 (twelve) hours. 06/17/22   [name], FNP  Oxycodone HCl 10 MG TABS Take 1 tablet (10 mg total) by mouth every 6 (six) hours as needed (pain). 07/09/22   [name], FNP  Vitamin D, Ergocalciferol, (DRISDOL) 1.25 MG (50000 UNIT) CAPS capsule TAKE ONE CAPSULE BY MOUTH ONE TIME PER WEEK Patient taking differently: Take 50,000 Units by mouth every Wednesday. 09/03/20   Vevelyn Francois, NP      Allergies    Ketamine    Review of Systems   Review of Systems  All other systems reviewed and are  negative.   Physical Exam Updated Vital Signs BP 121/76   Pulse 87   Temp 97.9 F (36.6 C)   Resp 16   SpO2 96%  Physical Exam Vitals and nursing note reviewed.  Constitutional:      General: She is not in acute distress.    Appearance: Normal appearance. She is well-developed. She is not toxic-appearing.  HENT:     Head: Normocephalic and atraumatic.  Eyes:     General: Lids are normal.     Conjunctiva/sclera: Conjunctivae normal.     Pupils: Pupils are equal, round, and reactive to light.  Neck:     Thyroid: No thyroid mass.     Trachea: No tracheal deviation.  Cardiovascular:     Rate and Rhythm: Normal rate and regular rhythm.     Heart sounds: Normal heart sounds. No murmur heard.    No gallop.  Pulmonary:     Effort: Pulmonary effort is normal. No respiratory distress.     Breath sounds: Normal breath sounds. No stridor. No decreased breath sounds, wheezing, rhonchi or rales.  Abdominal:     General: There is no distension.     Palpations: Abdomen is soft.     Tenderness: There is no abdominal tenderness. There is no rebound.  Musculoskeletal:        General: No tenderness. Normal range of motion.  Cervical back: Normal range of motion and neck supple.  Skin:    General: Skin is warm and dry.     Findings: No abrasion or rash.  Neurological:     Mental Status: She is alert and oriented to person, place, and time. Mental status is at baseline.     GCS: GCS eye subscore is 4. GCS verbal subscore is 5. GCS motor subscore is 6.     Cranial Nerves: No cranial nerve deficit.     Sensory: No sensory deficit.     Motor: Motor function is intact.  Psychiatric:        Attention and Perception: Attention normal.        Speech: Speech normal.        Behavior: Behavior normal.     ED Results / Procedures / Treatments   Labs (all labs ordered are listed, but only abnormal results are displayed) Labs Reviewed  CBC WITH DIFFERENTIAL/PLATELET  COMPREHENSIVE  METABOLIC PANEL  RETICULOCYTES  I-STAT BETA HCG BLOOD, ED ([facility], [facility], [facility] ONLY)    EKG None  Radiology No results found.  Procedures Procedures    Medications Ordered in ED Medications  0.45 % sodium chloride infusion (has no administration in time range)  HYDROmorphone (DILAUDID) injection 2 mg (has no administration in time range)  HYDROmorphone (DILAUDID) injection 2 mg (has no administration in time range)  HYDROmorphone (DILAUDID) injection 2 mg (has no administration in time range)  diphenhydrAMINE (BENADRYL) 12.5 mg in sodium chloride 0.9 % 50 mL IVPB (has no administration in time range)  ondansetron (ZOFRAN) injection 4 mg (has no administration in time range)    ED Course/ Medical Decision Making/ A&P                           Medical Decision Making Amount and/or Complexity of Data Reviewed Labs: ordered.  Risk Prescription drug management.   IV fluids given here as well as opiate analgesics and continues to still not pain.  Hemoglobin stable 11.4.  No concern for acute chest or other serious etiologies.  Patient likely having a sickle cell vaso-occlusive crisis and will require admission.  Discussed with sickle cell team and they will see patient  CRITICAL CARE Performed by: Leota Jacobsen Total critical care time: 50 minutes Critical care time was exclusive of separately billable procedures and treating other patients. Critical care was necessary to treat or prevent imminent or life-threatening deterioration. Critical care was time spent personally by me on the following activities: development of treatment plan with patient and/or surrogate as well as nursing, discussions with consultants, evaluation of patient's response to treatment, examination of patient, obtaining history from patient or surrogate, ordering and performing treatments and interventions, ordering and review of laboratory studies, ordering and review of radiographic studies, pulse oximetry and  re-evaluation of patient's condition.         Final Clinical Impression(s) / ED Diagnoses Final diagnoses:  None    Rx / DC Orders ED Discharge Orders     None         Lacretia Leigh, MD 07/14/22 1308

## 2022-07-14 NOTE — ED Notes (Signed)
IV team nurse at bedside to initiate IV

## 2022-07-15 DIAGNOSIS — D5700 Hb-SS disease with crisis, unspecified: Secondary | ICD-10-CM | POA: Diagnosis not present

## 2022-07-15 LAB — CBC
HCT: 30 % — ABNORMAL LOW (ref 36.0–46.0)
Hemoglobin: 10.8 g/dL — ABNORMAL LOW (ref 12.0–15.0)
MCH: 29.8 pg (ref 26.0–34.0)
MCHC: 36 g/dL (ref 30.0–36.0)
MCV: 82.6 fL (ref 80.0–100.0)
Platelets: 465 10*3/uL — ABNORMAL HIGH (ref 150–400)
RBC: 3.63 MIL/uL — ABNORMAL LOW (ref 3.87–5.11)
RDW: 12.9 % (ref 11.5–15.5)
WBC: 8.9 10*3/uL (ref 4.0–10.5)
nRBC: 0 % (ref 0.0–0.2)

## 2022-07-15 NOTE — TOC Initial Note (Signed)
Transition of Care [facility]) - Initial/Assessment Note    Patient Details  Name: Carmen [name] MRN: [id number] Date of Birth: 1991-07-20  Transition of Care [facility]) CM/SW Contact:    Leeroy Cha, RN Phone Number: 07/15/2022, 8:26 AM  Clinical Narrative:                  Transition of Care [facility]) Screening Note   Patient Details  Name: Carmen [name] Date of Birth: [birth year]   Transition of Care [facility]) CM/SW Contact:    Leeroy Cha, RN Phone Number: 07/15/2022, 8:26 AM    Transition of Care Department [facility]) has reviewed patient and no TOC needs have been identified at this time. We will continue to monitor patient advancement through interdisciplinary progression rounds. If new patient transition needs arise, please place a TOC consult.    Expected Discharge Plan: Home/Self Care Barriers to Discharge: Continued Medical Work up   Patient Goals and CMS Choice Patient states their goals for this hospitalization and ongoing recovery are:: to go hon[place] CMS Medicare.gov Compare Post Acute Care list provided to:: Patient Choice offered to / list presented to : Patient  Expected Discharge Plan and Services Expected Discharge Plan: Home/Self Care   Discharge Planning Services: CM Consult   Living arrangements for the past 2 months: Apartment                                      Prior Living Arrangements/Services Living arrangements for the past 2 months: Apartment Lives with:: Self   Do you feel safe going back to the place where you live?: Yes            Criminal Activity/Legal Involvement Pertinent to Current Situation/Hospitalization: No - Comment as needed  Activities of Daily Living Home Assistive Devices/Equipment: Eyeglasses ADL Screening (condition at time of admission) Patient's cognitive ability adequate to safely complete daily activities?: Yes Is the patient deaf or have difficulty hearing?: No Does the patient have difficulty seeing,  even when wearing glasses/contacts?: No Does the patient have difficulty concentrating, remembering, or making decisions?: No Patient able to express need for assistance with ADLs?: Yes Does the patient have difficulty dressing or bathing?: No Independently performs ADLs?: Yes (appropriate for developmental age) Does the patient have difficulty walking or climbing stairs?: No Weakness of Legs: Both Weakness of Arms/Hands: Both  Permission Sought/Granted                  Emotional Assessment Appearance:: Appears stated age Attitude/Demeanor/Rapport: Engaged Affect (typically observed): Accepting Orientation: : Oriented to Self, Oriented to Place, Oriented to  Time, Oriented to Situation Alcohol / Substance Use: Tobacco Use Psych Involvement: No (comment)  Admission diagnosis:  Sickle cell pain crisis ([place]) [D57.00] Patient Active Problem List   Diagnosis Date Noted   Nausea & vomiting 03/03/2022   Acute chest syndrome due to sickle cell crisis (HCC) 09/05/2021   Chronic pain syndrome 08/21/2021   Moderate episode of recurrent major depressive disorder ([place]) 05/28/2021   Major depressive disorder 05/22/2021   Tobacco abuse 05/21/2021   Sickle cell disease with crisis ([place]) 03/23/2021   Acute pain of right shoulder    Sickle cell anemia with crisis ([place]) 02/16/2021   Community acquired pneumonia of right lower lobe of lung    Fever of unknown origin 10/14/2020   Sickle cell crisis ([place]) 03/31/2020   Bacterial vaginitis 01/04/2020  Acute cystitis without hematuria    Dysuria 12/31/2019   Urine leukocytes    Sickle cell pain crisis ([place]) 09/10/2019   Sickle cell disease ([place]) 12/04/2018   Alpha thalassemia silent carrier 12/04/2018   Chronic prescription opiate use 03/13/2018   Chronic musculoskeletal pain 03/13/2018   Vitamin D deficiency 01/13/2018   Leukocytosis 08/21/2017   Hypokalemia 06/27/2017   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[facility]) 04/05/2017    Constipation due to pain medication    Hb-SS disease without crisis ([place]) 08/15/2016   Anemia of chronic disease    Chest pain 11/10/2015   Thrombocytosis 50/35/4656   Systolic ejection murmur 81/27/5170   Sickle cell anemia of mother during pregnancy ([place]) 05/16/2014   PCP:  [name], FNP Pharmacy:  No Pharmacies Listed    Social Determinants of Health (SDOH) Interventions    Readmission Risk Interventions   Row Labels 05/16/2022    2:52 PM 04/08/2022    9:35 AM 03/04/2022    2:02 PM  Readmission Risk Prevention Plan   Section Header. No data exists in this row.     Transportation Screening   Complete Complete Complete  Medication Review [employer])   Complete  Complete  PCP or Specialist appointment within 3-5 days of discharge   Complete Complete Complete  HRI or [place]   Complete Complete Complete  SW Recovery Care/Counseling Consult   Complete Complete Complete  Palliative Care Screening    Not Applicable Not [place]   Not Applicable Not Applicable Not Applicable

## 2022-07-15 NOTE — Progress Notes (Signed)
Subjective:  Carmen [name] is a 31 year old female with a medical history significant for sickle cell disease, chronic pain syndrome, opiate dependence and tolerance, and history of depression was admitted for sickle cell pain crisis. Patient continues to have increased pain primarily to low back and lower extremities.  She denies any headache, chest pain, urinary symptoms, nausea, vomiting, or diarrhea. Objective:  Vital signs in last 24 hours:  Vitals:   07/15/22 0007 07/15/22 0012 07/15/22 0323 07/15/22 0724  BP:  105/73    Pulse:  (!) 54    Resp: '18 18 18 18  '$ Temp:  (!) 97.3 F (36.3 C)    TempSrc:  Oral    SpO2: 96% 99% 98% 100%  Weight:      Height:        Intake/Output from previous day:   Intake/Output Summary (Last 24 hours) at 07/15/2022 0926 Last data filed at 07/14/2022 1733 Gross per 24 hour  Intake 240.04 ml  Output --  Net 240.04 ml    Physical Exam: General: Alert, awake, oriented x3, in no acute distress.  HEENT: [place]/AT [place]ER[place], EOMI Neck: Trachea midline,  no masses, no thyromegal,y no JVD, no carotid bruit OROPHARYNX:  Moist, No exudate/ erythema/lesions.  Heart: Regular rate and rhythm, without murmurs, rubs, gallops, PMI non-displaced, no heaves or thrills on palpation.  Lungs: Clear to auscultation, no wheezing or rhonchi noted. No increased vocal fremitus resonant to percussion  Abdomen: Soft, nontender, nondistended, positive bowel sounds, no masses no hepatosplenomegaly noted..  Neuro: No focal neurological deficits noted cranial nerves II through XII grossly intact. DTRs 2+ bilaterally upper and lower extremities. Strength 5 out of 5 in bilateral upper and lower extremities. Musculoskeletal: No warm swelling or erythema around joints, no spinal tenderness noted. Psychiatric: Patient alert and oriented x3, good insight and cognition, good recent to remote recall. Lymph node survey: No cervical axillary or inguinal lymphadenopathy noted.  Lab  Results:  Basic Metabolic Panel:    Component Value Date/Time   NA 140 07/14/2022 0930   NA 142 07/02/2020 1040   K 4.0 07/14/2022 0930   CL 112 (H) 07/14/2022 0930   CO2 20 (L) 07/14/2022 0930   BUN 10 07/14/2022 0930   BUN 8 07/02/2020 1040   CREATININE 0.58 07/14/2022 0930   GLUCOSE 104 (H) 07/14/2022 0930   CALCIUM 9.8 07/14/2022 0930   CBC:    Component Value Date/Time   WBC 9.0 07/14/2022 0930   HGB 11.4 (L) 07/14/2022 0930   HGB 9.8 (L) 07/02/2020 1040   HCT 31.8 (L) 07/14/2022 0930   HCT 28.9 (L) 07/02/2020 1040   PLT 561 (H) 07/14/2022 0930   PLT 628 (H) 07/02/2020 1040   MCV 82.6 07/14/2022 0930   MCV 88 07/02/2020 1040   NEUTROABS 5.9 07/14/2022 0930   NEUTROABS 3.0 07/02/2020 1040   LYMPHSABS 2.0 07/14/2022 0930   LYMPHSABS 3.5 (H) 07/02/2020 1040   MONOABS 0.9 07/14/2022 0930   EOSABS 0.1 07/14/2022 0930   EOSABS 0.2 07/02/2020 1040   BASOSABS 0.0 07/14/2022 0930   BASOSABS 0.1 07/02/2020 1040    No results found for this or any previous visit (from the past 240 hour(s)).  Studies/Results: No results found.  Medications: Scheduled Meds:  DULoxetine  60 mg Oral Daily   enoxaparin (LOVENOX) injection  40 mg Subcutaneous L97Q   folic acid  1 mg Oral Daily   HYDROmorphone   Intravenous Q4H   influenza vac split quadrivalent PF  0.5 mL Intramuscular Tomorrow-1000  ketorolac  15 mg Intravenous Q6H   morphine  60 mg Oral Q12H   senna-docusate  1 tablet Oral BID   Continuous Infusions:  sodium chloride 75 mL/hr at 07/14/22 1523   PRN Meds:.diphenhydrAMINE, naloxone **AND** sodium chloride flush, ondansetron (ZOFRAN) IV, polyethylene glycol  Consultants: none  Procedures: none  Antibiotics: none  Assessment/Plan: Principal Problem:   Sickle cell pain crisis (HCC) Active Problems:   Anemia of chronic disease   Major depressive disorder   Chronic pain syndrome  Sickle cell disease with pain crisis: Decrease IV fluids to KVO Continue IV  Dilaudid PCA without changes  Toradol 15 mg every 6 hours for total of 5 days MS Contin 60 mg every 12 hours Monitor vital signs very closely, reevaluate pain scale regularly, and supplemental oxygen as needed  Anemia of chronic disease: Hemoglobin is stable and consistent with patient's baseline.  There is no clinical indication for blood transfusion at this time.  Monitor labs closely.  Chronic pain syndrome: Continue home medications  Major depression disorder: Continue home medications.  No suicidal or homicidal intentions today.  Code Status: Full Code Family Communication: N/A Disposition Plan: Not yet ready for discharge  [place], MSN, FNP-C Patient [facility] [address] [place], [place] [address] [phone]  If 7PM-7AM, please contact night-coverage.  07/15/2022, 9:26 AM  LOS: 1 day

## 2022-07-16 DIAGNOSIS — D5700 Hb-SS disease with crisis, unspecified: Secondary | ICD-10-CM | POA: Diagnosis not present

## 2022-07-16 LAB — CBC
HCT: 26.2 % — ABNORMAL LOW (ref 36.0–46.0)
Hemoglobin: 9.3 g/dL — ABNORMAL LOW (ref 12.0–15.0)
MCH: 29.7 pg (ref 26.0–34.0)
MCHC: 35.5 g/dL (ref 30.0–36.0)
MCV: 83.7 fL (ref 80.0–100.0)
Platelets: 433 10*3/uL — ABNORMAL HIGH (ref 150–400)
RBC: 3.13 MIL/uL — ABNORMAL LOW (ref 3.87–5.11)
RDW: 13.5 % (ref 11.5–15.5)
WBC: 10.7 10*3/uL — ABNORMAL HIGH (ref 4.0–10.5)
nRBC: 0.2 % (ref 0.0–0.2)

## 2022-07-16 MED ORDER — SODIUM CHLORIDE 0.9 % IV SOLN
12.5000 mg | Freq: Once | INTRAVENOUS | Status: AC
  Administered 2022-07-16: 12.5 mg via INTRAVENOUS
  Filled 2022-07-16: qty 12.5

## 2022-07-16 NOTE — Plan of Care (Signed)
Pt c/o pain 9/10.  Encouraged PCA use.  Additional scheduled pain meds administered per MAR.  Problem: Pain Managment: Goal: General experience of comfort will improve Outcome: Not Progressing   Problem: Education: Goal: Knowledge of vaso-occlusive preventative measures will improve Outcome: Progressing Goal: Awareness of infection prevention will improve Outcome: Progressing Goal: Awareness of signs and symptoms of anemia will improve Outcome: Progressing Goal: Long-term complications will improve Outcome: Progressing   Problem: Self-Care: Goal: Ability to incorporate actions that prevent/reduce pain crisis will improve Outcome: Progressing   Problem: Bowel/Gastric: Goal: Gut motility will be maintained Outcome: Progressing   Problem: Tissue Perfusion: Goal: Complications related to inadequate tissue perfusion will be avoided or minimized Outcome: Progressing   Problem: Respiratory: Goal: Pulmonary complications will be avoided or minimized Outcome: Progressing Goal: Acute Chest Syndrome will be identified early to prevent complications Outcome: Progressing   Problem: Fluid Volume: Goal: Ability to maintain a balanced intake and output will improve Outcome: Progressing   Problem: Sensory: Goal: Pain level will decrease with appropriate interventions Outcome: Progressing   Problem: Health Behavior: Goal: Postive changes in compliance with treatment and prescription regimens will improve Outcome: Progressing   Problem: Education: Goal: Knowledge of General Education information will improve Description: Including pain rating scale, medication(s)/side effects and non-pharmacologic comfort measures Outcome: Progressing   Problem: Health Behavior/Discharge Planning: Goal: Ability to manage health-related needs will improve Outcome: Progressing   Problem: Clinical Measurements: Goal: Ability to maintain clinical measurements within normal limits will  improve Outcome: Progressing Goal: Will remain free from infection Outcome: Progressing Goal: Diagnostic test results will improve Outcome: Progressing Goal: Respiratory complications will improve Outcome: Progressing Goal: Cardiovascular complication will be avoided Outcome: Progressing   Problem: Activity: Goal: Risk for activity intolerance will decrease Outcome: Progressing   Problem: Nutrition: Goal: Adequate nutrition will be maintained Outcome: Progressing   Problem: Coping: Goal: Level of anxiety will decrease Outcome: Progressing   Problem: Elimination: Goal: Will not experience complications related to bowel motility Outcome: Progressing Goal: Will not experience complications related to urinary retention Outcome: Progressing   Problem: Safety: Goal: Ability to remain free from injury will improve Outcome: Progressing   Problem: Skin Integrity: Goal: Risk for impaired skin integrity will decrease Outcome: Progressing

## 2022-07-16 NOTE — Progress Notes (Signed)
Subjective:  Carmen [name] is a 31 year old female with a medical history significant for sickle cell disease, chronic pain syndrome, opiate dependence and tolerance, and history of depression was admitted for sickle cell pain crisis.  Patient has no new complaints on today.  Patient continues to have increased pain primarily to low back and lower extremities.  She denies any headache, chest pain, urinary symptoms, nausea, vomiting, or diarrhea. Objective:  Vital signs in last 24 hours:  Vitals:   07/15/22 2345 07/16/22 0143 07/16/22 0827 07/16/22 1106  BP:  113/70  120/72  Pulse:  79  71  Resp: '16 18 20 19  '$ Temp:  (!) 97.4 F (36.3 C)  98.1 F (36.7 C)  TempSrc:    Oral  SpO2: 96% 97% 95% 100%  Weight:      Height:        Intake/Output from previous day:  No intake or output data in the 24 hours ending 07/16/22 1110   Physical Exam: General: Alert, awake, oriented x3, in no acute distress.  HEENT: [place]/AT [place], EOMI Neck: Trachea midline,  no masses, no thyromegal,y no JVD, no carotid bruit OROPHARYNX:  Moist, No exudate/ erythema/lesions.  Heart: Regular rate and rhythm, without murmurs, rubs, gallops, PMI non-displaced, no heaves or thrills on palpation.  Lungs: Clear to auscultation, no wheezing or rhonchi noted. No increased vocal fremitus resonant to percussion  Abdomen: Soft, nontender, nondistended, positive bowel sounds, no masses no hepatosplenomegaly noted..  Neuro: No focal neurological deficits noted cranial nerves II through XII grossly intact. DTRs 2+ bilaterally upper and lower extremities. Strength 5 out of 5 in bilateral upper and lower extremities. Musculoskeletal: No warm swelling or erythema around joints, no spinal tenderness noted. Psychiatric: Patient alert and oriented x3, good insight and cognition, good recent to remote recall. Lymph node survey: No cervical axillary or inguinal lymphadenopathy noted.  Lab Results:  Basic Metabolic Panel:     Component Value Date/Time   NA 140 07/14/2022 0930   NA 142 07/02/2020 1040   K 4.0 07/14/2022 0930   CL 112 (H) 07/14/2022 0930   CO2 20 (L) 07/14/2022 0930   BUN 10 07/14/2022 0930   BUN 8 07/02/2020 1040   CREATININE 0.58 07/14/2022 0930   GLUCOSE 104 (H) 07/14/2022 0930   CALCIUM 9.8 07/14/2022 0930   CBC:    Component Value Date/Time   WBC 10.7 (H) 07/16/2022 0832   HGB 9.3 (L) 07/16/2022 0832   HGB 9.8 (L) 07/02/2020 1040   HCT 26.2 (L) 07/16/2022 0832   HCT 28.9 (L) 07/02/2020 1040   PLT 433 (H) 07/16/2022 0832   PLT 628 (H) 07/02/2020 1040   MCV 83.7 07/16/2022 0832   MCV 88 07/02/2020 1040   NEUTROABS 5.9 07/14/2022 0930   NEUTROABS 3.0 07/02/2020 1040   LYMPHSABS 2.0 07/14/2022 0930   LYMPHSABS 3.5 (H) 07/02/2020 1040   MONOABS 0.9 07/14/2022 0930   EOSABS 0.1 07/14/2022 0930   EOSABS 0.2 07/02/2020 1040   BASOSABS 0.0 07/14/2022 0930   BASOSABS 0.1 07/02/2020 1040    No results found for this or any previous visit (from the past 240 hour(s)).  Studies/Results: No results found.  Medications: Scheduled Meds:  DULoxetine  60 mg Oral Daily   enoxaparin (LOVENOX) injection  40 mg Subcutaneous H29J   folic acid  1 mg Oral Daily   HYDROmorphone   Intravenous Q4H   ketorolac  15 mg Intravenous Q6H   morphine  60 mg Oral Q12H   senna-docusate  1 tablet Oral BID   Continuous Infusions:  sodium chloride 75 mL/hr at 07/16/22 1040   promethazine (PHENERGAN) injection (IM or IVPB)     PRN Meds:.diphenhydrAMINE, naloxone **AND** sodium chloride flush, ondansetron (ZOFRAN) IV, polyethylene glycol  Consultants: none  Procedures: none  Antibiotics: none  Assessment/Plan: Principal Problem:   Sickle cell pain crisis (HCC) Active Problems:   Anemia of chronic disease   Major depressive disorder   Chronic pain syndrome  Sickle cell disease with pain crisis: Decrease IV fluids to KVO Continue IV Dilaudid PCA without changes Toradol 15 mg every 6  hours for total of 5 days MS Contin 60 mg every 12 hours Monitor vital signs very closely, reevaluate pain scale regularly, and supplemental oxygen as needed  Anemia of chronic disease: Hemoglobin is stable and consistent with patient's baseline.  There is no clinical indication for blood transfusion at this time.  Monitor labs closely.  Chronic pain syndrome: Continue home medications  Major depression disorder: Continue home medications.  No suicidal or homicidal intentions today.  Code Status: Full Code Family Communication: N/A Disposition Plan: Not yet ready for discharge  [place], MSN, FNP-C Patient [facility] [address] [place], [place] [address] [phone]  If 7PM-7AM, please contact night-coverage.  07/16/2022, 11:10 AM  LOS: 2 days

## 2022-07-17 DIAGNOSIS — D5700 Hb-SS disease with crisis, unspecified: Secondary | ICD-10-CM | POA: Diagnosis not present

## 2022-07-17 DIAGNOSIS — D638 Anemia in other chronic diseases classified elsewhere: Secondary | ICD-10-CM

## 2022-07-17 DIAGNOSIS — G894 Chronic pain syndrome: Secondary | ICD-10-CM | POA: Diagnosis not present

## 2022-07-17 DIAGNOSIS — F329 Major depressive disorder, single episode, unspecified: Secondary | ICD-10-CM

## 2022-07-17 MED ORDER — LORAZEPAM 1 MG PO TABS
1.0000 mg | ORAL_TABLET | Freq: Once | ORAL | Status: AC
  Administered 2022-07-17: 1 mg via ORAL
  Filled 2022-07-17: qty 1

## 2022-07-17 NOTE — Progress Notes (Signed)
Patient ID: Carmen [name], female   DOB: [birth year], 31 y.o.   MRN: [id number] Subjective: [name] is a 31 year old female with a medical history significant for sickle cell disease, chronic pain syndrome, opiate dependence and tolerance, and history of depression was admitted for sickle cell pain crisis.  Patient has no new complaints today but still having significant pain in her lower extremities and lower back.  She is also complaining of not able to sleep and need "something for rest".  She denies any fever, headache, cough, chest pain, shortness of breath, nausea, vomiting or diarrhea.  No urinary symptoms.  Objective:  Vital signs in last 24 hours:  Vitals:   07/17/22 0826 07/17/22 1241 07/17/22 1517 07/17/22 1710  BP:   98/72   Pulse:   86   Resp: '17 16 14 11  '$ Temp:   98.7 F (37.1 C)   TempSrc:   Oral   SpO2: 98% 98% 96% 96%  Weight:      Height:        Intake/Output from previous day:   Intake/Output Summary (Last 24 hours) at 07/17/2022 1741 Last data filed at 07/17/2022 0439 Gross per 24 hour  Intake 3335 ml  Output --  Net 3335 ml    Physical Exam: General: Alert, awake, oriented x3, in no acute distress.  Obese. HEENT: [place]/AT [place], EOMI Neck: Trachea midline,  no masses, no thyromegal,y no JVD, no carotid bruit OROPHARYNX:  Moist, No exudate/ erythema/lesions.  Heart: Regular rate and rhythm, without murmurs, rubs, gallops, PMI non-displaced, no heaves or thrills on palpation.  Lungs: Clear to auscultation, no wheezing or rhonchi noted. No increased vocal fremitus resonant to percussion  Abdomen: Soft, nontender, nondistended, positive bowel sounds, no masses no hepatosplenomegaly noted..  Neuro: No focal neurological deficits noted cranial nerves II through XII grossly intact. DTRs 2+ bilaterally upper and lower extremities. Strength 5 out of 5 in bilateral upper and lower extremities. Musculoskeletal: No warm swelling or erythema around joints, no spinal  tenderness noted. Psychiatric: Patient alert and oriented x3, good insight and cognition, good recent to remote recall. Lymph node survey: No cervical axillary or inguinal lymphadenopathy noted.  Lab Results:  Basic Metabolic Panel:    Component Value Date/Time   NA 140 07/14/2022 0930   NA 142 07/02/2020 1040   K 4.0 07/14/2022 0930   CL 112 (H) 07/14/2022 0930   CO2 20 (L) 07/14/2022 0930   BUN 10 07/14/2022 0930   BUN 8 07/02/2020 1040   CREATININE 0.58 07/14/2022 0930   GLUCOSE 104 (H) 07/14/2022 0930   CALCIUM 9.8 07/14/2022 0930   CBC:    Component Value Date/Time   WBC 10.7 (H) 07/16/2022 0832   HGB 9.3 (L) 07/16/2022 0832   HGB 9.8 (L) 07/02/2020 1040   HCT 26.2 (L) 07/16/2022 0832   HCT 28.9 (L) 07/02/2020 1040   PLT 433 (H) 07/16/2022 0832   PLT 628 (H) 07/02/2020 1040   MCV 83.7 07/16/2022 0832   MCV 88 07/02/2020 1040   NEUTROABS 5.9 07/14/2022 0930   NEUTROABS 3.0 07/02/2020 1040   LYMPHSABS 2.0 07/14/2022 0930   LYMPHSABS 3.5 (H) 07/02/2020 1040   MONOABS 0.9 07/14/2022 0930   EOSABS 0.1 07/14/2022 0930   EOSABS 0.2 07/02/2020 1040   BASOSABS 0.0 07/14/2022 0930   BASOSABS 0.1 07/02/2020 1040    No results found for this or any previous visit (from the past 240 hour(s)).  Studies/Results: No results found.  Medications: Scheduled Meds:  DULoxetine  60 mg Oral Daily   enoxaparin (LOVENOX) injection  40 mg Subcutaneous R00T   folic acid  1 mg Oral Daily   HYDROmorphone   Intravenous Q4H   ketorolac  15 mg Intravenous Q6H   LORazepam  1 mg Oral Once   morphine  60 mg Oral Q12H   senna-docusate  1 tablet Oral BID   Continuous Infusions:  sodium chloride 10 mL/hr at 07/17/22 0439   PRN Meds:.diphenhydrAMINE, naloxone **AND** sodium chloride flush, ondansetron (ZOFRAN) IV, polyethylene glycol  Consultants: None  Procedures: None  Antibiotics: None  Assessment/Plan: Principal Problem:   Sickle cell pain crisis (HCC) Active Problems:    Anemia of chronic disease   Major depressive disorder   Chronic pain syndrome  Hb Sickle Cell Disease with Pain crisis: [facility], continue weight based Dilaudid PCA at current dose setting, continue IV Toradol 15 mg Q 6 H for total of 5 days, continue oral home pain medications as ordered.  Add 1 mg of Ativan x1 dose.  Monitor vitals very closely, Re-evaluate pain scale regularly, 2 L of Oxygen by [place]. Anemia of Chronic Disease: Hemoglobin remained stable at baseline today.  There is no clinical indication for blood transfusion at this time.  We will continue to monitor very closely and will transfuse as appropriate. Chronic pain Syndrome: Continue home pain medications as ordered. Major depressive disorder: Stable. Patient denies any suicidal ideations or thoughts today. Continue her home medications.  Code Status: Full Code Family Communication: N/A Disposition Plan: Not yet ready for discharge  Arionna Hoggard  If 7PM-7AM, please contact night-coverage.  07/17/2022, 5:41 PM  LOS: 3 days

## 2022-07-17 NOTE — Plan of Care (Signed)
Pt c/o pain 8/10 this morning.  PO intake improved from yesterday per pt.  Encouraged PCA use.  Problem: Nutrition: Goal: Adequate nutrition will be maintained Outcome: Not Progressing   Problem: Pain Managment: Goal: General experience of comfort will improve Outcome: Not Progressing

## 2022-07-18 DIAGNOSIS — D638 Anemia in other chronic diseases classified elsewhere: Secondary | ICD-10-CM | POA: Diagnosis not present

## 2022-07-18 DIAGNOSIS — D5700 Hb-SS disease with crisis, unspecified: Secondary | ICD-10-CM | POA: Diagnosis not present

## 2022-07-18 DIAGNOSIS — G894 Chronic pain syndrome: Secondary | ICD-10-CM | POA: Diagnosis not present

## 2022-07-18 DIAGNOSIS — F329 Major depressive disorder, single episode, unspecified: Secondary | ICD-10-CM | POA: Diagnosis not present

## 2022-07-18 LAB — CBC WITH DIFFERENTIAL/PLATELET
Abs Immature Granulocytes: 0.13 10*3/uL — ABNORMAL HIGH (ref 0.00–0.07)
Basophils Absolute: 0 10*3/uL (ref 0.0–0.1)
Basophils Relative: 0 %
Eosinophils Absolute: 0.4 10*3/uL (ref 0.0–0.5)
Eosinophils Relative: 3 %
HCT: 25.1 % — ABNORMAL LOW (ref 36.0–46.0)
Hemoglobin: 9.2 g/dL — ABNORMAL LOW (ref 12.0–15.0)
Immature Granulocytes: 1 %
Lymphocytes Relative: 25 %
Lymphs Abs: 3.2 10*3/uL (ref 0.7–4.0)
MCH: 29.4 pg (ref 26.0–34.0)
MCHC: 36.7 g/dL — ABNORMAL HIGH (ref 30.0–36.0)
MCV: 80.2 fL (ref 80.0–100.0)
Monocytes Absolute: 1.7 10*3/uL — ABNORMAL HIGH (ref 0.1–1.0)
Monocytes Relative: 14 %
Neutro Abs: 7.2 10*3/uL (ref 1.7–7.7)
Neutrophils Relative %: 57 %
Platelet Morphology: NORMAL
Platelets: 444 10*3/uL — ABNORMAL HIGH (ref 150–400)
RBC: 3.13 MIL/uL — ABNORMAL LOW (ref 3.87–5.11)
RDW: 14.8 % (ref 11.5–15.5)
WBC: 12.6 10*3/uL — ABNORMAL HIGH (ref 4.0–10.5)
nRBC: 0.6 % — ABNORMAL HIGH (ref 0.0–0.2)

## 2022-07-18 LAB — BASIC METABOLIC PANEL
Anion gap: 5 (ref 5–15)
BUN: 11 mg/dL (ref 6–20)
CO2: 26 mmol/L (ref 22–32)
Calcium: 9.9 mg/dL (ref 8.9–10.3)
Chloride: 110 mmol/L (ref 98–111)
Creatinine, Ser: 0.57 mg/dL (ref 0.44–1.00)
GFR, Estimated: >60 mL/min (ref >60)
Glucose, Bld: 114 mg/dL — ABNORMAL HIGH (ref 70–99)
Potassium: 4.1 mmol/L (ref 3.5–5.1)
Sodium: 141 mmol/L (ref 135–145)

## 2022-07-18 MED ORDER — SODIUM CHLORIDE 0.9% FLUSH
10.0000 mL | Freq: Two times a day (BID) | INTRAVENOUS | Status: DC
  Administered 2022-07-18: 10 mL

## 2022-07-18 MED ORDER — SODIUM CHLORIDE 0.9% FLUSH: 10.0000 mL | INTRAVENOUS | Status: DC | PRN

## 2022-07-18 MED ORDER — MELATONIN 3 MG PO TABS
6.0000 mg | ORAL_TABLET | Freq: Once | ORAL | Status: AC
  Administered 2022-07-18: 6 mg via ORAL
  Filled 2022-07-18: qty 2

## 2022-07-18 MED ORDER — HYDROMORPHONE HCL 1 MG/ML IJ SOLN
1.0000 mg | Freq: Once | INTRAMUSCULAR | Status: AC
  Administered 2022-07-18: 1 mg via INTRAMUSCULAR
  Filled 2022-07-18: qty 1

## 2022-07-18 NOTE — Progress Notes (Signed)

## 2022-07-18 NOTE — Progress Notes (Signed)
Patient ID: Carmen [name], female   DOB: 19-May-1991, 31 y.o.   MRN: [id number] Subjective: Carmen [name] is a 31 year old female with a medical history significant for sickle cell disease, chronic pain syndrome, opiate dependence and tolerance, and history of depression was admitted for sickle cell pain crisis.  Patient's general condition is much improved this morning. Patient was seen sitting up in bed, more energetic, less pain today. She rates her pain at about 6/10 still in her lower back. She is ambulating well now.  She denies any fever, headache, cough, chest pain, shortness of breath, nausea, vomiting or diarrhea.  Objective:  Vital signs in last 24 hours:  Vitals:   07/18/22 0149 07/18/22 0627 07/18/22 1004 07/18/22 1146  BP: 122/79 118/66 (!) 126/92   Pulse: 73 (!) 110 82   Resp: '14 18 16 16  '$ Temp: 98.8 F (37.1 C) 99 F (37.2 C) 98.2 F (36.8 C)   TempSrc: Oral Oral Oral   SpO2: 98% 99% 93% 99%  Weight:      Height:        Intake/Output from previous day:   Intake/Output Summary (Last 24 hours) at 07/18/2022 1209 Last data filed at 07/18/2022 0500 Gross per 24 hour  Intake 480 ml  Output --  Net 480 ml     Physical Exam: General: Alert, awake, oriented x3, in no acute distress.  Obese. HEENT: [place]/AT [place]ERL, EOMI Neck: Trachea midline,  no masses, no thyromegal,y no JVD, no carotid bruit OROPHARYNX:  Moist, No exudate/ erythema/lesions.  Heart: Regular rate and rhythm, without murmurs, rubs, gallops, PMI non-displaced, no heaves or thrills on palpation.  Lungs: Clear to auscultation, no wheezing or rhonchi noted. No increased vocal fremitus resonant to percussion  Abdomen: Soft, nontender, nondistended, positive bowel sounds, no masses no hepatosplenomegaly noted..  Neuro: No focal neurological deficits noted cranial nerves II through XII grossly intact. DTRs 2+ bilaterally upper and lower extremities. Strength 5 out of 5 in bilateral upper and lower  extremities. Musculoskeletal: No warm swelling or erythema around joints, no spinal tenderness noted. Psychiatric: Patient alert and oriented x3, good insight and cognition, good recent to remote recall. Lymph node survey: No cervical axillary or inguinal lymphadenopathy noted.  Lab Results:  Basic Metabolic Panel:    Component Value Date/Time   NA 141 07/18/2022 1057   NA 142 07/02/2020 1040   K 4.1 07/18/2022 1057   CL 110 07/18/2022 1057   CO2 26 07/18/2022 1057   BUN 11 07/18/2022 1057   BUN 8 07/02/2020 1040   CREATININE 0.57 07/18/2022 1057   GLUCOSE 114 (H) 07/18/2022 1057   CALCIUM 9.9 07/18/2022 1057   CBC:    Component Value Date/Time   WBC 12.6 (H) 07/18/2022 1057   HGB 9.2 (L) 07/18/2022 1057   HGB 9.8 (L) 07/02/2020 1040   HCT 25.1 (L) 07/18/2022 1057   HCT 28.9 (L) 07/02/2020 1040   PLT 444 (H) 07/18/2022 1057   PLT 628 (H) 07/02/2020 1040   MCV 80.2 07/18/2022 1057   MCV 88 07/02/2020 1040   NEUTROABS 7.2 07/18/2022 1057   NEUTROABS 3.0 07/02/2020 1040   LYMPHSABS 3.2 07/18/2022 1057   LYMPHSABS 3.5 (H) 07/02/2020 1040   MONOABS 1.7 (H) 07/18/2022 1057   EOSABS 0.4 07/18/2022 1057   EOSABS 0.2 07/02/2020 1040   BASOSABS 0.0 07/18/2022 1057   BASOSABS 0.1 07/02/2020 1040    No results found for this or any previous visit (from the past 240 hour(s)).  Studies/Results: No  results found.  Medications: Scheduled Meds:  DULoxetine  60 mg Oral Daily   enoxaparin (LOVENOX) injection  40 mg Subcutaneous Q59D   folic acid  1 mg Oral Daily   HYDROmorphone   Intravenous Q4H   ketorolac  15 mg Intravenous Q6H   morphine  60 mg Oral Q12H   senna-docusate  1 tablet Oral BID   sodium chloride flush  10-40 mL Intracatheter Q12H   Continuous Infusions:  sodium chloride 10 mL/hr at 07/17/22 0439   PRN Meds:.diphenhydrAMINE, naloxone **AND** sodium chloride flush, ondansetron (ZOFRAN) IV, polyethylene glycol, sodium chloride  flush  Consultants: None  Procedures: None  Antibiotics: None  Assessment/Plan: Principal Problem:   Sickle cell pain crisis (HCC) Active Problems:   Anemia of chronic disease   Major depressive disorder   Chronic pain syndrome  Hb Sickle Cell Disease with Pain crisis: IVF at [facility], begin to wean weight based Dilaudid PCA in anticipation of discharge home tomorrow morning. Continue IV Toradol 15 mg Q 6 H for total of 5 days, continue oral home pain medications as ordered.  Another 1 mg of Ativan x1 dose at night.  Monitor vitals very closely, Re-evaluate pain scale regularly, 2 L of Oxygen by [place]. Anemia of Chronic Disease: Hemoglobin continues to be stable at baseline. There is no clinical indication for blood transfusion at this time. We will continue to monitor very closely and will transfuse as appropriate. Chronic pain Syndrome: Continue home pain medications as ordered. Major depressive disorder: Stable. Patient denies any suicidal ideations or thoughts today. Continue her home medications.  Code Status: Full Code Family Communication: N/A Disposition Plan: Not yet ready for discharge  Cobi [place]ape  If 7PM-7AM, please contact night-coverage.  07/18/2022, 12:09 PM  LOS: 4 days

## 2022-07-18 NOTE — Progress Notes (Signed)
Pt lost iv access. IV team attempted and unable to insert new iv. Lennox Grumbles, NP paged and patient was given '1mg'$  dilaudid IM as ordered. Pt agreed to let day shift iv team attempt to get an IV.

## 2022-07-19 DIAGNOSIS — D638 Anemia in other chronic diseases classified elsewhere: Secondary | ICD-10-CM | POA: Diagnosis not present

## 2022-07-19 DIAGNOSIS — D571 Sickle-cell disease without crisis: Secondary | ICD-10-CM

## 2022-07-19 DIAGNOSIS — G894 Chronic pain syndrome: Secondary | ICD-10-CM | POA: Diagnosis not present

## 2022-07-19 DIAGNOSIS — Z79891 Long term (current) use of opiate analgesic: Secondary | ICD-10-CM

## 2022-07-19 DIAGNOSIS — D5700 Hb-SS disease with crisis, unspecified: Secondary | ICD-10-CM | POA: Diagnosis not present

## 2022-07-19 MED ORDER — MORPHINE SULFATE ER 60 MG PO TBCR: 60.0000 mg | EXTENDED_RELEASE_TABLET | Freq: Two times a day (BID) | ORAL | 0 refills | Status: DC

## 2022-07-19 NOTE — Discharge Summary (Signed)
Physician Discharge Summary  Carmen [name] MPN:[id number] DOB: 02/14/1991 DOA: 07/14/2022  PCP: [name], FNP  Admit date: 07/14/2022  Discharge date: 07/19/2022  Discharge Diagnoses:  Principal Problem:   Sickle cell pain crisis ([place]) Active Problems:   Anemia of chronic disease   Major depressive disorder   Chronic pain syndrome   Discharge Condition: Stable  Disposition:  Pt is discharged home in good condition and is to follow up with [name], FNP this week to have labs evaluated. Carmen [name] is instructed to increase activity slowly and balance with rest for the next few days, and use prescribed medication to complete treatment of pain  Diet: Regular Wt Readings from Last 3 Encounters:  07/14/22 83.9 kg  06/10/22 83.9 kg  05/14/22 83.9 kg    History of present illness:  Carmen [name]  is a 31 y.o. female with a medical history significant for sickle cell disease, chronic pain syndrome, opiate dependence and tolerance, history of anemia of chronic disease, and depression presents to the emergency department with complaints of pain to low back and lower extremities.  Patient's pain is consistent with previous sickle cell crisis.  Patient says that her pain intensity has been increased over the past several days and has been mostly unrelieved by her home medications.  She attributes crisis to changes in weather.  She last had oxycodone and MS Contin this a.m. without very much relief.  She currently rates pain as 9/10, constant, and aching.  She denies any fever, chills, chest pain, or shortness of breath.  No urinary symptoms, nausea, vomiting, or diarrhea.  No sick contacts, recent travel, or known exposure to COVID-19.   ER course: While in the ER, patient's vital signs remained stable.  Reviewed all laboratory values.  Comprehensive metabolic panel is unremarkable.  Patient's hemoglobin is 11.4, which is consistent with her baseline.  Pain intensity remained  elevated despite IV Dilaudid and IV fluids.  Sickle cell team contacted and patient will be admitted to hospital for further management of sickle cell pain crisis.  Hospital Course:  Patient was admitted for sickle cell pain crisis and managed appropriately with IVF, IV Dilaudid via PCA and IV Toradol, as well as other adjunct therapies per sickle cell pain management protocols.  Patient slowly improved on above regimen.  Pain slowly returned to baseline.  Hemoglobin remained stable at baseline throughout this admission, she did not require any blood transfusion.  Patient was successfully weaned off Dilaudid via PCA and transitioned to home pain medications.  Patient remained hemodynamically stable throughout his admission. Patient was therefore discharged home today in a hemodynamically stable condition.  Her home pain medications were prescribed for 2 weeks upon discharge.  Quetzali will follow-up with PCP within 1 week of this discharge. Curry was counseled extensively about nonpharmacologic means of pain management, patient verbalized understanding and was appreciative of  the care received during this admission.   We discussed the need for good hydration, monitoring of hydration status, avoidance of heat, cold, stress, and infection triggers. We discussed the need to be adherent with taking Hydrea and other home medications. Patient was reminded of the need to seek medical attention immediately if any symptom of bleeding, anemia, or infection occurs.  Discharge Exam: Vitals:   07/19/22 0525 07/19/22 0806  BP: 109/65   Pulse: 96   Resp: 16 16  Temp: 98.2 F (36.8 C)   SpO2: 93% 95%   Vitals:   07/19/22 0200 07/19/22 0521 07/19/22  0525 07/19/22 0806  BP: 122/63  109/65   Pulse: (!) 101  96   Resp: '16 18 16 16  '$ Temp: 99 F (37.2 C)  98.2 F (36.8 C)   TempSrc: Oral  Oral   SpO2: 96%  93% 95%  Weight:      Height:        General appearance : Awake, alert, not in any distress. Speech  Clear. Not toxic looking HEENT: Atraumatic and Normocephalic, pupils equally reactive to light and accomodation Neck: Supple, no JVD. No cervical lymphadenopathy.  Chest: Good air entry bilaterally, no added sounds  CVS: S1 S2 regular, no murmurs.  Abdomen: Bowel sounds present, Non tender and not distended with no gaurding, rigidity or rebound. Extremities: B/L Lower Ext shows no edema, both legs are warm to touch Neurology: Awake alert, and oriented X 3, CN II-XII intact, Non focal Skin: No Rash  Discharge Instructions  Discharge Instructions     Diet - low sodium heart healthy   Complete by: As directed    Increase activity slowly   Complete by: As directed       Allergies as of 07/19/2022       Reactions   Ketamine Hives, Nausea And Vomiting, Other (See Comments)   Makes the patient "feel funny" also        Medication List     TAKE these medications    betamethasone valerate ointment 0.1 % Commonly known as: VALISONE APPLY TO AFFECTED AREA TWICE A DAY   cyclobenzaprine 10 MG tablet Commonly known as: FLEXERIL TAKE 1 TABLET BY MOUTH DAILY AS NEEDED FOR MUSCLE SPASMS.   DULoxetine 30 MG capsule Commonly known as: CYMBALTA Take 2 capsules (60 mg total) by mouth daily.   folic acid 1 MG tablet Commonly known as: FOLVITE Take 1 tablet (1 mg total) by mouth daily.   morphine 60 MG 12 hr tablet Commonly known as: MS CONTIN Take 1 tablet (60 mg total) by mouth every 12 (twelve) hours.   Oxycodone HCl 10 MG Tabs Take 1 tablet (10 mg total) by mouth every 6 (six) hours as needed (pain).   PRENATAL PO Take 1 tablet by mouth daily.   Vitamin D (Ergocalciferol) 1.25 MG (50000 UNIT) Caps capsule Commonly known as: DRISDOL TAKE ONE CAPSULE BY MOUTH ONE TIME PER WEEK        The results of significant diagnostics from this hospitalization (including imaging, microbiology, ancillary and laboratory) are listed below for reference.    Significant Diagnostic  Studies: No results found.  Microbiology: No results found for this or any previous visit (from the past 240 hour(s)).   Labs: Basic Metabolic Panel: Recent Labs  Lab 07/14/22 0930 07/18/22 1057  NA 140 141  K 4.0 4.1  CL 112* 110  CO2 20* 26  GLUCOSE 104* 114*  BUN 10 11  CREATININE 0.58 0.57  CALCIUM 9.8 9.9   Liver Function Tests: Recent Labs  Lab 07/14/22 0930  AST 30  ALT 24  ALKPHOS 65  BILITOT 0.9  PROT 8.9*  ALBUMIN 4.5   No results for input(s): "LIPASE", "AMYLASE" in the last 168 hours. No results for input(s): "AMMONIA" in the last 168 hours. CBC: Recent Labs  Lab 07/14/22 0930 07/15/22 0907 07/16/22 0832 07/18/22 1057  WBC 9.0 8.9 10.7* 12.6*  NEUTROABS 5.9  --   --  7.2  HGB 11.4* 10.8* 9.3* 9.2*  HCT 31.8* 30.0* 26.2* 25.1*  MCV 82.6 82.6 83.7 80.2  PLT 561* 465*  433* 444*   Cardiac Enzymes: No results for input(s): "CKTOTAL", "CKMB", "CKMBINDEX", "TROPONINI" in the last 168 hours. BNP: Invalid input(s): "POCBNP" CBG: No results for input(s): "GLUCAP" in the last 168 hours.  Time coordinating discharge: 50 minutes  Signed:  [facility] 07/19/2022, 11:19 AM

## 2022-07-19 NOTE — TOC Progression Note (Signed)
Transition of Care [facility]) - Progression Note    Patient Details  Name: ERINN MENDOSA MRN: [id number] Date of Birth: [birth year]  Transition of [facility]) CM/SW Contact  Leeroy Cha, RN Phone Number: 07/19/2022, 7:22 AM  Clinical Narrative:    Instructions and information of food insecurities and problems with her power bill given to patient.    Expected Discharge Plan: Home/Self Care Barriers to Discharge: Continued Medical Work up  Expected Discharge Plan and Services Expected Discharge Plan: Home/Self Care   Discharge Planning Services: CM Consult   Living arrangements for the past 2 months: Apartment                                       Social Determinants of Health (SDOH) Interventions    Readmission Risk Interventions   Row Labels 05/16/2022    2:52 PM 04/08/2022    9:35 AM 03/04/2022    2:02 PM  Readmission Risk Prevention Plan   Section Header. No data exists in this row.     Transportation Screening   Complete Complete Complete  Medication Review Press photographer)   Complete  Complete  PCP or Specialist appointment within 3-5 days of discharge   Complete Complete Complete  HRI or [place]   Complete Complete Complete  SW Recovery Care/Counseling Consult   Complete Complete Complete  Palliative Care Screening    Not Applicable Not [place]   Not Applicable Not Applicable Not Applicable

## 2022-07-19 NOTE — Discharge Instructions (Signed)
Call power company to work out a Agricultural consultant. You may qualify for a reduced rate and assistance.

## 2022-07-19 NOTE — Plan of Care (Signed)
Patient being discharged home. PCA discontinued. Midline and tele removed per order. Discharge teaching completed and all questions answered.    Problem: Education: Goal: Knowledge of vaso-occlusive preventative measures will improve Outcome: Adequate for Discharge Goal: Awareness of infection prevention will improve Outcome: Adequate for Discharge Goal: Awareness of signs and symptoms of anemia will improve Outcome: Adequate for Discharge Goal: Long-term complications will improve Outcome: Adequate for Discharge   Problem: Self-Care: Goal: Ability to incorporate actions that prevent/reduce pain crisis will improve Outcome: Adequate for Discharge   Problem: Bowel/Gastric: Goal: Gut motility will be maintained Outcome: Adequate for Discharge   Problem: Tissue Perfusion: Goal: Complications related to inadequate tissue perfusion will be avoided or minimized Outcome: Adequate for Discharge   Problem: Respiratory: Goal: Pulmonary complications will be avoided or minimized Outcome: Adequate for Discharge Goal: Acute Chest Syndrome will be identified early to prevent complications Outcome: Adequate for Discharge   Problem: Fluid Volume: Goal: Ability to maintain a balanced intake and output will improve Outcome: Adequate for Discharge   Problem: Sensory: Goal: Pain level will decrease with appropriate interventions Outcome: Adequate for Discharge   Problem: Health Behavior: Goal: Postive changes in compliance with treatment and prescription regimens will improve Outcome: Adequate for Discharge   Problem: Education: Goal: Knowledge of General Education information will improve Description: Including pain rating scale, medication(s)/side effects and non-pharmacologic comfort measures Outcome: Adequate for Discharge   Problem: Health Behavior/Discharge Planning: Goal: Ability to manage health-related needs will improve Outcome: Adequate for Discharge   Problem: Clinical  Measurements: Goal: Ability to maintain clinical measurements within normal limits will improve Outcome: Adequate for Discharge Goal: Will remain free from infection Outcome: Adequate for Discharge Goal: Diagnostic test results will improve Outcome: Adequate for Discharge Goal: Respiratory complications will improve Outcome: Adequate for Discharge Goal: Cardiovascular complication will be avoided Outcome: Adequate for Discharge   Problem: Activity: Goal: Risk for activity intolerance will decrease Outcome: Adequate for Discharge   Problem: Nutrition: Goal: Adequate nutrition will be maintained Outcome: Adequate for Discharge   Problem: Coping: Goal: Level of anxiety will decrease Outcome: Adequate for Discharge   Problem: Elimination: Goal: Will not experience complications related to bowel motility Outcome: Adequate for Discharge Goal: Will not experience complications related to urinary retention Outcome: Adequate for Discharge   Problem: Pain Managment: Goal: General experience of comfort will improve Outcome: Adequate for Discharge   Problem: Safety: Goal: Ability to remain free from injury will improve Outcome: Adequate for Discharge   Problem: Skin Integrity: Goal: Risk for impaired skin integrity will decrease Outcome: Adequate for Discharge

## 2022-07-21 ENCOUNTER — Other Ambulatory Visit: Payer: Self-pay | Admitting: Family Medicine

## 2022-07-21 DIAGNOSIS — D571 Sickle-cell disease without crisis: Secondary | ICD-10-CM

## 2022-07-21 DIAGNOSIS — Z79891 Long term (current) use of opiate analgesic: Secondary | ICD-10-CM

## 2022-07-21 MED ORDER — OXYCODONE HCL 10 MG PO TABS: 10.0000 mg | ORAL_TABLET | Freq: Four times a day (QID) | ORAL | 0 refills | Status: DC | PRN

## 2022-07-21 NOTE — Progress Notes (Signed)
Reviewed PDMP substance reporting system prior to prescribing opiate medications. No inconsistencies noted.  Meds ordered this encounter  Medications   Oxycodone HCl 10 MG TABS    Sig: Take 1 tablet (10 mg total) by mouth every 6 (six) hours as needed (pain).    Dispense:  60 tablet    Refill:  0    Order Specific Question:   Supervising Provider    Answer:   [name], [name] [[id number]]   Nancyjo Givhan Moore Zuzu Befort  APRN, MSN, FNP-C [facility] [address]  [place], [place] [address] [phone]  

## 2022-08-13 ENCOUNTER — Inpatient Hospital Stay (HOSPITAL_COMMUNITY)
Admission: EM | Admit: 2022-08-13 | Discharge: 2022-08-18 | DRG: 812 | Disposition: A | Discharge status: Home / Self Care | Payer: Medicaid Other | Attending: Internal Medicine | Admitting: Internal Medicine

## 2022-08-13 ENCOUNTER — Other Ambulatory Visit: Payer: Self-pay

## 2022-08-13 ENCOUNTER — Encounter (HOSPITAL_COMMUNITY): Payer: Self-pay

## 2022-08-13 ENCOUNTER — Emergency Department (HOSPITAL_COMMUNITY): Payer: Medicaid Other

## 2022-08-13 DIAGNOSIS — X31XXXA Exposure to excessive natural cold, initial encounter: Secondary | ICD-10-CM | POA: Diagnosis not present

## 2022-08-13 DIAGNOSIS — Z841 Family history of disorders of kidney and ureter: Secondary | ICD-10-CM | POA: Diagnosis not present

## 2022-08-13 DIAGNOSIS — D638 Anemia in other chronic diseases classified elsewhere: Secondary | ICD-10-CM | POA: Diagnosis present

## 2022-08-13 DIAGNOSIS — D5700 Hb-SS disease with crisis, unspecified: Secondary | ICD-10-CM | POA: Diagnosis present

## 2022-08-13 DIAGNOSIS — F419 Anxiety disorder, unspecified: Secondary | ICD-10-CM | POA: Diagnosis present

## 2022-08-13 DIAGNOSIS — F1120 Opioid dependence, uncomplicated: Secondary | ICD-10-CM | POA: Diagnosis present

## 2022-08-13 DIAGNOSIS — Z23 Encounter for immunization: Secondary | ICD-10-CM

## 2022-08-13 DIAGNOSIS — Z888 Allergy status to other drugs, medicaments and biological substances status: Secondary | ICD-10-CM | POA: Diagnosis not present

## 2022-08-13 DIAGNOSIS — G894 Chronic pain syndrome: Secondary | ICD-10-CM | POA: Diagnosis present

## 2022-08-13 DIAGNOSIS — Z8249 Family history of ischemic heart disease and other diseases of the circulatory system: Secondary | ICD-10-CM | POA: Diagnosis not present

## 2022-08-13 DIAGNOSIS — E876 Hypokalemia: Secondary | ICD-10-CM | POA: Diagnosis present

## 2022-08-13 DIAGNOSIS — D571 Sickle-cell disease without crisis: Secondary | ICD-10-CM

## 2022-08-13 DIAGNOSIS — Z832 Family history of diseases of the blood and blood-forming organs and certain disorders involving the immune mechanism: Secondary | ICD-10-CM | POA: Diagnosis not present

## 2022-08-13 DIAGNOSIS — F329 Major depressive disorder, single episode, unspecified: Secondary | ICD-10-CM | POA: Diagnosis present

## 2022-08-13 DIAGNOSIS — Z8261 Family history of arthritis: Secondary | ICD-10-CM | POA: Diagnosis not present

## 2022-08-13 DIAGNOSIS — Z79891 Long term (current) use of opiate analgesic: Secondary | ICD-10-CM

## 2022-08-13 LAB — CBC WITH DIFFERENTIAL/PLATELET
Abs Immature Granulocytes: 0.02 10*3/uL (ref 0.00–0.07)
Basophils Absolute: 0 10*3/uL (ref 0.0–0.1)
Basophils Relative: 0 %
Eosinophils Absolute: 0.2 10*3/uL (ref 0.0–0.5)
Eosinophils Relative: 2 %
HCT: 28.2 % — ABNORMAL LOW (ref 36.0–46.0)
Hemoglobin: 10 g/dL — ABNORMAL LOW (ref 12.0–15.0)
Immature Granulocytes: 0 %
Lymphocytes Relative: 34 %
Lymphs Abs: 3.1 10*3/uL (ref 0.7–4.0)
MCH: 29.4 pg (ref 26.0–34.0)
MCHC: 35.5 g/dL (ref 30.0–36.0)
MCV: 82.9 fL (ref 80.0–100.0)
Monocytes Absolute: 1 10*3/uL (ref 0.1–1.0)
Monocytes Relative: 11 %
Neutro Abs: 4.9 10*3/uL (ref 1.7–7.7)
Neutrophils Relative %: 53 %
Platelets: 571 10*3/uL — ABNORMAL HIGH (ref 150–400)
RBC: 3.4 MIL/uL — ABNORMAL LOW (ref 3.87–5.11)
RDW: 13.7 % (ref 11.5–15.5)
WBC: 9.3 10*3/uL (ref 4.0–10.5)
nRBC: 0.3 % — ABNORMAL HIGH (ref 0.0–0.2)

## 2022-08-13 LAB — RETICULOCYTES
Immature Retic Fract: 32.7 % — ABNORMAL HIGH (ref 2.3–15.9)
RBC.: 3.44 MIL/uL — ABNORMAL LOW (ref 3.87–5.11)
Retic Count, Absolute: 175.4 10*3/uL (ref 19.0–186.0)
Retic Ct Pct: 5.1 % — ABNORMAL HIGH (ref 0.4–3.1)

## 2022-08-13 LAB — I-STAT BETA HCG BLOOD, ED (MC, WL, AP ONLY): I-stat hCG, quantitative: <5 m[IU]/mL (ref <5)

## 2022-08-13 LAB — COMPREHENSIVE METABOLIC PANEL
ALT: 22 U/L (ref 0–44)
AST: 26 U/L (ref 15–41)
Albumin: 4.3 g/dL (ref 3.5–5.0)
Alkaline Phosphatase: 63 U/L (ref 38–126)
Anion gap: 6 (ref 5–15)
BUN: 11 mg/dL (ref 6–20)
CO2: 25 mmol/L (ref 22–32)
Calcium: 9.6 mg/dL (ref 8.9–10.3)
Chloride: 108 mmol/L (ref 98–111)
Creatinine, Ser: 0.8 mg/dL (ref 0.44–1.00)
GFR, Estimated: >60 mL/min (ref >60)
Glucose, Bld: 104 mg/dL — ABNORMAL HIGH (ref 70–99)
Potassium: 3.2 mmol/L — ABNORMAL LOW (ref 3.5–5.1)
Sodium: 139 mmol/L (ref 135–145)
Total Bilirubin: 1.3 mg/dL — ABNORMAL HIGH (ref 0.3–1.2)
Total Protein: 8.3 g/dL — ABNORMAL HIGH (ref 6.5–8.1)

## 2022-08-13 MED ORDER — SODIUM CHLORIDE 0.9% FLUSH: 9.0000 mL | INTRAVENOUS | Status: DC | PRN

## 2022-08-13 MED ORDER — POLYETHYLENE GLYCOL 3350 17 G PO PACK: 17.0000 g | PACK | Freq: Every day | ORAL | Status: DC | PRN

## 2022-08-13 MED ORDER — HYDROMORPHONE HCL 1 MG/ML IJ SOLN
0.5000 mg | INTRAMUSCULAR | Status: DC
  Filled 2022-08-13: qty 1

## 2022-08-13 MED ORDER — POTASSIUM CHLORIDE 20 MEQ PO PACK
40.0000 meq | PACK | Freq: Once | ORAL | Status: AC
  Administered 2022-08-13: 40 meq via ORAL
  Filled 2022-08-13: qty 2

## 2022-08-13 MED ORDER — ONDANSETRON HCL 4 MG/2ML IJ SOLN
4.0000 mg | Freq: Four times a day (QID) | INTRAMUSCULAR | Status: DC | PRN
  Administered 2022-08-13: 4 mg via INTRAVENOUS
  Filled 2022-08-13: qty 2

## 2022-08-13 MED ORDER — HYDROMORPHONE HCL 2 MG/ML IJ SOLN
2.0000 mg | INTRAMUSCULAR | Status: AC | PRN
  Administered 2022-08-13 – 2022-08-16 (×4): 2 mg via INTRAVENOUS
  Filled 2022-08-13 (×4): qty 1

## 2022-08-13 MED ORDER — KETOROLAC TROMETHAMINE 15 MG/ML IJ SOLN
15.0000 mg | Freq: Four times a day (QID) | INTRAMUSCULAR | Status: AC
  Administered 2022-08-13 – 2022-08-18 (×20): 15 mg via INTRAVENOUS
  Filled 2022-08-13 (×20): qty 1

## 2022-08-13 MED ORDER — DULOXETINE HCL 60 MG PO CPEP
60.0000 mg | ORAL_CAPSULE | Freq: Every day | ORAL | Status: DC
  Administered 2022-08-13 – 2022-08-18 (×6): 60 mg via ORAL
  Filled 2022-08-13 (×6): qty 1

## 2022-08-13 MED ORDER — ONDANSETRON HCL 4 MG/2ML IJ SOLN
4.0000 mg | INTRAMUSCULAR | Status: DC | PRN
  Administered 2022-08-13: 4 mg via INTRAVENOUS
  Filled 2022-08-13: qty 2

## 2022-08-13 MED ORDER — SENNOSIDES-DOCUSATE SODIUM 8.6-50 MG PO TABS
1.0000 | ORAL_TABLET | Freq: Two times a day (BID) | ORAL | Status: DC
  Administered 2022-08-13 – 2022-08-18 (×10): 1 via ORAL
  Filled 2022-08-13 (×10): qty 1

## 2022-08-13 MED ORDER — PROCHLORPERAZINE EDISYLATE 10 MG/2ML IJ SOLN
10.0000 mg | Freq: Once | INTRAMUSCULAR | Status: AC
  Administered 2022-08-13: 10 mg via INTRAVENOUS
  Filled 2022-08-13: qty 2

## 2022-08-13 MED ORDER — HYDROMORPHONE HCL 1 MG/ML IJ SOLN: 0.5000 mg | INTRAMUSCULAR | Status: DC

## 2022-08-13 MED ORDER — POTASSIUM CHLORIDE CRYS ER 20 MEQ PO TBCR
20.0000 meq | EXTENDED_RELEASE_TABLET | Freq: Every day | ORAL | Status: DC
  Administered 2022-08-13 – 2022-08-18 (×6): 20 meq via ORAL
  Filled 2022-08-13 (×6): qty 1

## 2022-08-13 MED ORDER — HYDROMORPHONE HCL 2 MG/ML IJ SOLN
2.0000 mg | INTRAMUSCULAR | Status: AC
  Administered 2022-08-13: 2 mg via SUBCUTANEOUS
  Filled 2022-08-13: qty 1

## 2022-08-13 MED ORDER — SODIUM CHLORIDE 0.9 % IV SOLN
12.5000 mg | Freq: Once | INTRAVENOUS | Status: AC
  Administered 2022-08-13: 12.5 mg via INTRAVENOUS
  Filled 2022-08-13: qty 12.5

## 2022-08-13 MED ORDER — MORPHINE SULFATE ER 15 MG PO TBCR
60.0000 mg | EXTENDED_RELEASE_TABLET | Freq: Two times a day (BID) | ORAL | Status: DC
  Administered 2022-08-13 – 2022-08-18 (×11): 60 mg via ORAL
  Filled 2022-08-13 (×11): qty 4

## 2022-08-13 MED ORDER — ENOXAPARIN SODIUM 40 MG/0.4ML IJ SOSY
40.0000 mg | PREFILLED_SYRINGE | INTRAMUSCULAR | Status: DC
  Filled 2022-08-13 (×4): qty 0.4

## 2022-08-13 MED ORDER — INFLUENZA VAC SPLIT QUAD 0.5 ML IM SUSY
0.5000 mL | PREFILLED_SYRINGE | INTRAMUSCULAR | Status: AC
  Administered 2022-08-14: 0.5 mL via INTRAMUSCULAR
  Filled 2022-08-13: qty 0.5

## 2022-08-13 MED ORDER — NALOXONE HCL 0.4 MG/ML IJ SOLN: 0.4000 mg | INTRAMUSCULAR | Status: DC | PRN

## 2022-08-13 MED ORDER — LIP MEDEX EX OINT
1.0000 | TOPICAL_OINTMENT | CUTANEOUS | Status: DC | PRN
  Administered 2022-08-13: 1 via TOPICAL
  Filled 2022-08-13 (×2): qty 7

## 2022-08-13 MED ORDER — DIPHENHYDRAMINE HCL 25 MG PO CAPS
25.0000 mg | ORAL_CAPSULE | ORAL | Status: DC | PRN
  Administered 2022-08-16: 25 mg via ORAL
  Filled 2022-08-13: qty 1

## 2022-08-13 MED ORDER — FOLIC ACID 1 MG PO TABS
1.0000 mg | ORAL_TABLET | Freq: Every day | ORAL | Status: DC
  Administered 2022-08-13 – 2022-08-18 (×6): 1 mg via ORAL
  Filled 2022-08-13 (×6): qty 1

## 2022-08-13 MED ORDER — HYDROMORPHONE 1 MG/ML IV SOLN
INTRAVENOUS | Status: DC
  Administered 2022-08-13: 3.6 mg via INTRAVENOUS
  Administered 2022-08-13: 9 mg via INTRAVENOUS
  Administered 2022-08-14: 4.2 mg via INTRAVENOUS
  Administered 2022-08-14: 5.4 mg via INTRAVENOUS
  Administered 2022-08-14: 3 mg via INTRAVENOUS
  Administered 2022-08-14: 10.2 mg via INTRAVENOUS
  Administered 2022-08-14: 1.8 mg via INTRAVENOUS
  Administered 2022-08-15: 4.8 mg via INTRAVENOUS
  Administered 2022-08-15: 7.8 mg via INTRAVENOUS
  Administered 2022-08-15: 8.4 mg via INTRAVENOUS
  Administered 2022-08-15: 7.2 mg via INTRAVENOUS
  Administered 2022-08-15: 30 mg via INTRAVENOUS
  Administered 2022-08-15: 5.4 mg via INTRAVENOUS
  Administered 2022-08-15: 6.6 mg via INTRAVENOUS
  Administered 2022-08-16: 12.6 mg via INTRAVENOUS
  Administered 2022-08-16: 3.6 mg via INTRAVENOUS
  Administered 2022-08-16: 9 mg via INTRAVENOUS
  Administered 2022-08-16: 30 mg via INTRAVENOUS
  Administered 2022-08-16: 10.2 mg via INTRAVENOUS
  Administered 2022-08-17 (×2): 30 mg via INTRAVENOUS
  Filled 2022-08-13 (×9): qty 30

## 2022-08-13 NOTE — ED Provider Notes (Signed)
[facility] DEPT Provider Note   CSN: [id number] Arrival date & time: 08/13/22  1751     History  Chief Complaint  Patient presents with   Sickle Cell Pain Crisis    Carmen [name] is a 31 y.o. female.  Patient presents with pain in her upper back, shoulders and extremities bilaterally for the past 3 days. Reports that she presents today in particular as her pain has been worsening. Follows with the sickle cell clinic, home regimen includes morphine and oxycodone. On good days when she does not have any pain, she takes nothing. When she starts to experience pain then she takes morphine and if this does not provide relief then she escalates to oxycodone. She last took oxycodone this morning. Triggers include cold weather, she had the air on at her home which she thinks is contributing to her pain. Denies recent illness, sick contacts or dehydration. Denies fever, chills, chest pain, dyspnea, abdominal pain, leg swelling and other extremity swelling.   The history is provided by the patient.  Sickle Cell Pain Crisis Location:  Upper extremity, lower extremity and back Timing:  Constant Progression:  Worsening Chronicity:  Recurrent Sickle cell genotype:  SS Context: cold exposure   Context: not change in medication, not dehydration, not infection and not stress   Associated symptoms: no chest pain, no fever, no shortness of breath and no swelling of legs   Risk factors: frequent pain crises   Risk factors: no exertion and no prior acute chest        Home Medications Prior to Admission medications   Medication Sig Start Date End Date Taking? Authorizing Provider  betamethasone valerate ointment (VALISONE) 0.1 % APPLY TO AFFECTED AREA TWICE A DAY Patient not taking: Reported on 05/10/2022 04/07/22   [name], FNP  cyclobenzaprine (FLEXERIL) 10 MG tablet TAKE 1 TABLET BY MOUTH DAILY AS NEEDED FOR MUSCLE SPASMS. Patient not taking: Reported on  07/14/2022 05/31/22   [name], FNP  DULoxetine (CYMBALTA) 30 MG capsule Take 2 capsules (60 mg total) by mouth daily. Patient not taking: Reported on 05/10/2022 02/04/22 08/03/22  [name], FNP  folic acid (FOLVITE) 1 MG tablet Take 1 tablet (1 mg total) by mouth daily. 03/15/19   Lanae Boast, FNP  morphine (MS CONTIN) 60 MG 12 hr tablet Take 1 tablet (60 mg total) by mouth every 12 (twelve) hours. 07/19/22   Tresa Garter, MD  Oxycodone HCl 10 MG TABS Take 1 tablet (10 mg total) by mouth every 6 (six) hours as needed (pain). 07/23/22   [name], FNP  Prenatal Vit-Fe Fumarate-FA (PRENATAL PO) Take 1 tablet by mouth daily.    [provider]  Vitamin D, Ergocalciferol, (DRISDOL) 1.25 MG (50000 UNIT) CAPS capsule TAKE ONE CAPSULE BY MOUTH ONE TIME PER WEEK Patient not taking: Reported on 07/14/2022 09/03/20   Vevelyn Francois, NP      Allergies    Ketamine    Review of Systems   Review of Systems  Constitutional:  Negative for fever.  Respiratory:  Negative for shortness of breath.   Cardiovascular:  Negative for chest pain.  Musculoskeletal:  Positive for back pain.       Shoulder, upper and lower extremity pain bilaterally   All other systems reviewed and are negative.   Physical Exam Updated Vital Signs BP 114/71   Pulse 62   Temp 98.4 F (36.9 C) (Oral)   Resp 18   Ht  $'5\' 3"'B$  (1.6 m)   Wt 86.2 kg   LMP 08/10/2022   SpO2 98%   BMI 33.66 kg/m  Physical Exam Vitals reviewed.  Constitutional:      General: She is not in acute distress. HENT:     Head: Normocephalic and atraumatic.     Right Ear: External ear normal.     Left Ear: External ear normal.     Nose: Nose normal. No congestion or rhinorrhea.     Mouth/Throat:     Mouth: Mucous membranes are moist.     Pharynx: No oropharyngeal exudate or posterior oropharyngeal erythema.  Eyes:     General: No scleral icterus.       Right eye: No discharge.        Left eye: No discharge.      Extraocular Movements: Extraocular movements intact.     Conjunctiva/sclera: Conjunctivae normal.     Pupils: Pupils are equal, round, and reactive to light.  Cardiovascular:     Rate and Rhythm: Normal rate and regular rhythm.     Pulses: Normal pulses.     Heart sounds: Normal heart sounds. No murmur heard.    No gallop.  Pulmonary:     Effort: Pulmonary effort is normal. No respiratory distress.     Breath sounds: Normal breath sounds. No stridor. No wheezing, rhonchi or rales.  Chest:     Chest wall: No tenderness.  Abdominal:     General: Bowel sounds are normal. There is no distension.     Palpations: Abdomen is soft. There is no mass.     Tenderness: There is no abdominal tenderness. There is no right CVA tenderness, left CVA tenderness, guarding or rebound.     Hernia: No hernia is present.     Comments: No evidence of splenomegaly  Musculoskeletal:        General: Tenderness (tenderness along upper thoracic and shoulders bilaterally) present. No swelling, deformity or signs of injury.     Cervical back: Normal range of motion. No tenderness.     Right lower leg: No edema.     Left lower leg: No edema.     Comments: No evidence of dactylitis   Lymphadenopathy:     Cervical: No cervical adenopathy.  Skin:    General: Skin is warm.     Capillary Refill: Capillary refill takes less than 2 seconds.     Coloration: Skin is not jaundiced.     Findings: No bruising or erythema.  Neurological:     General: No focal deficit present.     Mental Status: She is alert.     Motor: No weakness.  Psychiatric:        Mood and Affect: Mood normal.        Behavior: Behavior normal.     ED Results / Procedures / Treatments   Labs (all labs ordered are listed, but only abnormal results are displayed) Labs Reviewed  COMPREHENSIVE METABOLIC PANEL - Abnormal; Notable for the following components:      Result Value   Potassium 3.2 (*)    Glucose, Bld 104 (*)    Total Protein 8.3 (*)     Total Bilirubin 1.3 (*)    All other components within normal limits  CBC WITH DIFFERENTIAL/PLATELET - Abnormal; Notable for the following components:   RBC 3.40 (*)    Hemoglobin 10.0 (*)    HCT 28.2 (*)    Platelets 571 (*)    nRBC 0.3 (*)  All other components within normal limits  RETICULOCYTES - Abnormal; Notable for the following components:   Retic Ct Pct 5.1 (*)    RBC. 3.44 (*)    Immature Retic Fract 32.7 (*)    All other components within normal limits  I-STAT BETA HCG BLOOD, ED ([facility], [facility], [facility] ONLY)    EKG None  Radiology DG Chest Portable 1 View  Result Date: 08/13/2022 CLINICAL DATA:  Sickle cell with pain in upper back and shoulders. EXAM: PORTABLE CHEST 1 VIEW COMPARISON:  Chest radiograph 05/15/2022 and 03/01/2022 FINDINGS: Small densities at the right lung base could represent atelectasis and chronic changes. Otherwise, both lungs are clear. Heart and mediastinum are within normal limits. Trachea is midline. Negative for a pneumothorax. No acute bone abnormality. IMPRESSION: No acute findings. Electronically Signed   By: Markus Daft M.D.   On: 08/13/2022 07:58    Procedures Procedures  None  Medications Ordered in ED Medications  ondansetron (ZOFRAN) injection 4 mg (4 mg Intravenous Given 08/13/22 0814)  HYDROmorphone (DILAUDID) injection 2 mg (has no administration in time range)  HYDROmorphone (DILAUDID) injection 2 mg (2 mg Subcutaneous Given 08/13/22 0813)  HYDROmorphone (DILAUDID) injection 2 mg (2 mg Subcutaneous Given 08/13/22 0859)  HYDROmorphone (DILAUDID) injection 2 mg (2 mg Subcutaneous Given 08/13/22 0940)  diphenhydrAMINE (BENADRYL) 12.5 mg in sodium chloride 0.9 % 50 mL IVPB (0 mg Intravenous Stopped 08/13/22 0930)  potassium chloride (KLOR-CON) packet 40 mEq (40 mEq Oral Given 08/13/22 0940)    ED Course/ Medical Decision Making/ A&P                           Medical Decision Making Amount and/or Complexity of Data Reviewed Labs:  ordered. Radiology: ordered.  Risk Prescription drug management. Decision regarding hospitalization.    Patient with history of sickle cell presents with multiple areas of focal pain consistent with sickle cell crisis. Last hospitalized about a month ago. Follows with sickle cell center. Home regimen includes oxycodone and morphine. Per chart review, patient has been on dilautid before which has helped the pain. Blood work notable for hypokalemia of 3.2 with repletion ordered, Hgb 10 with baseline ranging 8-10 with elevated retic count. CXR demonstrates no acute findings. After first dliautid dose, patient still reporting 9/10 pain. No evidence of splenomegaly, dactylitis or other joint swelling and low concern for acute chest. Sickle cell crisis without complications vs chronic pain. Given dilautid 2 mg x3. Upon reevaluation, patient still reporting 9-10/10 pain that has little to no improvement. Will need to be admitted for further pain management. Patient agreeable to do this. Discussed case with [name], NP who agrees to admit patient for pain control.    Final Clinical Impression(s) / ED Diagnoses Final diagnoses:  Sickle cell pain crisis [facility])    Rx / DC Orders ED Discharge Orders     None         Donney Dice, DO 08/13/22 [place], [place], DO 08/13/22 1605

## 2022-08-13 NOTE — ED Provider Notes (Signed)
8:03 AM  31 yo female with hx SCD here today with pain, onset Tuesday, shoulders/back/knees, feels similar to her sickle pain episodes in the past. Has been taking her home medications with transient improvement to her discomfort. Last dose of oxy was around 0400. Unable to be seen in clinic due to staffing issues per the pt. No fevers or chills, no cp or dib reported. No sick contacts or recent travel. Feels similar to prior episodes of sickle cell pain crisis. Breathing comfortably on ambient air, lungs ctab on exam, afebrile, HDS. Will obtain screening labs sickle cell order panel and give dilaudid subq and zofran.  Labs and imaging stable.  Chest XR read by myself, interpreted by myself within my scope of practice does not show acute abnormalities, no acute chest.  Hemoglobin similar to her baseline.  Reticulocyte panel similar to her baseline.  Patient with ongoing pain despite multiple doses of IV opiates on re-assessment, she is on ambient air, not hypoxic, afebrile.  We will plan admission for sickle cell pain crisis.  Admit to sickle cell team. See resident note.    CRITICAL CARE Performed by: Jeanell Sparrow   Total critical care time: 31 minutes  Critical care time was exclusive of separately billable procedures and treating other patients.  Critical care was necessary to treat or prevent imminent or life-threatening deterioration.  Critical care was time spent personally by me on the following activities: development of treatment plan with patient and/or surrogate as well as nursing, discussions with consultants, evaluation of patient's response to treatment, examination of patient, obtaining history from patient or surrogate, ordering and performing treatments and interventions, ordering and review of laboratory studies, ordering and review of radiographic studies, pulse oximetry and re-evaluation of patient's condition.     Wynona Dove A, DO 08/13/22 1604

## 2022-08-13 NOTE — ED Triage Notes (Signed)
Patient c/o sickle cell pain of the upper back and shoulders, and pain from bilateral knees to her ankles.

## 2022-08-13 NOTE — Progress Notes (Signed)
PCA initiated. Tubing primed with accidental waste. Syringe at 17 ml when started. Charge notified and witness to waste.

## 2022-08-13 NOTE — H&P (Signed)
H&P  Patient Demographics:  Carmen [name], is a 31 y.o. female  MRN: [id number]   DOB - 1991-01-17  Admit Date - 08/13/2022  Outpatient Primary MD for the patient is [name], FNP  Chief Complaint  Patient presents with   Sickle Cell Pain Crisis      HPI:   Carmen [name]  is a 31 y.o. female with a medical history significant for sickle cell disease, chronic pain syndrome, opiate dependence and tolerance, major depressive disorder, and anemia of chronic disease presents to the emergency department with worsening low back pain over the past 2 days.  Patient says that low back pain is consistent with her typical sickle cell pain crisis.  Patient was in her usual state of health until 2 days prior when pain intensity increased.  She attributes current pain crisis to sleeping with her air conditioner running.  She says that she has been taking her home MS Contin and oxycodone without very much relief.  She has not had any recent falls or injuries.  She denies any objective fever, chills, or rigors.  No chest pain, urinary symptoms, nausea, vomiting, or diarrhea.  There is been no recent travel, sick contacts, or known exposure to COVID-19.  ER course: While in the ER, patient's vital signs remained stable.  Upon arriving to unit vital signs are recorded as:BP 115/70 (BP Location: Right Arm)   Pulse 67   Temp 98.4 F (36.9 C) (Oral)   Resp 20   Ht '5\' 3"'$  (1.6 m)   Wt 86.2 kg   LMP 08/10/2022   SpO2 100%   BMI 33.66 kg/m  Patient's labs are notable for hemoglobin 10.0, platelets 571,000, and WBCs 9.3.  Complete metabolic panel shows potassium 3.2, and total bilirubin 1.3, otherwise unremarkable.  Chest x-ray shows no acute cardiopulmonary process.  Patient's pain persists despite IV Dilaudid, IV Toradol, and IV fluids.  Patient admitted for further management of her sickle cell pain crisis.   Review of systems:  In addition to the HPI above, patient reports No fever or chills No  Headache, No changes with vision or hearing No problems swallowing food or liquids No chest pain, cough or shortness of breath No abdominal pain, No nausea or vomiting, Bowel movements are regular No blood in stool or urine No dysuria No new skin rashes or bruises No new joints pains-aches No new weakness, tingling, numbness in any extremity No recent weight gain or loss No polyuria, polydypsia or polyphagia No significant Mental Stressors e negative. With Past History of the following :   Past Medical History:  Diagnosis Date   Anxiety    Headache(784.0)    Heart murmur    Sickle cell crisis ([place])    Syphilis 2015   Was diagnosed and received one injection of antibiotics   Thrombocytosis 11/22/2014     CBC Latest Ref Rng & Units 12/13/2018 12/11/2018 12/10/2018 WBC 4.0 - 10.5 K/uL 14.8(H) 13.2(H) 15.9(H) Hemoglobin 12.0 - 15.0 g/dL 8.2(L) 7.7(L) 8.1(L) Hematocrit 36.0 - 46.0 % 23.7(L) 23.2(L) 24.5(L) Platelets 150 - 400 K/uL 326 399 388        Past Surgical History:  Procedure Laterality Date   CESAREAN SECTION N/A 02/05/2019   Procedure: CESAREAN SECTION;  Surgeon: [name], MD;  Location: [facility] ORS;  Service: Obstetrics;  Laterality: N/A;   CHOLECYSTECTOMY N/A 11/30/2014   Procedure: LAPAROSCOPIC CHOLECYSTECTOMY SINGLE SITE WITH INTRAOPERATIVE CHOLANGIOGRAM;  Surgeon: Michael Boston, MD;  Location: [facility] ORS;  Service: General;  Laterality: N/A;   SPLENECTOMY       Social History:   Social History   Tobacco Use   Smoking status: Some Days    Types: Cigarettes   Smokeless tobacco: Never  Substance Use Topics   Alcohol use: No     Lives - At home   Family History :   Family History  Problem Relation Age of Onset   Hypertension Mother    Sickle cell anemia Sister    Kidney disease Sister        Lupus   Arthritis Sister    Sickle cell anemia Sister    Sickle cell trait Sister    Heart disease Maternal Aunt        CABG   Heart disease Maternal Uncle         CABG   Lupus Sister      Home Medications:   Prior to Admission medications   Medication Sig Start Date End Date Taking? Authorizing Provider  folic acid (FOLVITE) 1 MG tablet Take 1 tablet (1 mg total) by mouth daily. 03/15/19  Yes Lanae Boast, FNP  morphine (MS CONTIN) 60 MG 12 hr tablet Take 1 tablet (60 mg total) by mouth every 12 (twelve) hours. 07/19/22  Yes Tresa Garter, MD  Oxycodone HCl 10 MG TABS Take 1 tablet (10 mg total) by mouth every 6 (six) hours as needed (pain). Patient taking differently: Take 10 mg by mouth 2 (two) times daily as needed (pain). 07/23/22  Yes [name], FNP  betamethasone valerate ointment (VALISONE) 0.1 % APPLY TO AFFECTED AREA TWICE A DAY Patient not taking: Reported on 05/10/2022 04/07/22   [name], FNP  cyclobenzaprine (FLEXERIL) 10 MG tablet TAKE 1 TABLET BY MOUTH DAILY AS NEEDED FOR MUSCLE SPASMS. Patient not taking: Reported on 07/14/2022 05/31/22   [name], FNP  DULoxetine (CYMBALTA) 30 MG capsule Take 2 capsules (60 mg total) by mouth daily. Patient not taking: Reported on 05/10/2022 02/04/22 08/03/22  [name], FNP  Vitamin D, Ergocalciferol, (DRISDOL) 1.25 MG (50000 UNIT) CAPS capsule TAKE ONE CAPSULE BY MOUTH ONE TIME PER WEEK Patient not taking: Reported on 07/14/2022 09/03/20   Vevelyn Francois, NP     Allergies:   Allergies  Allergen Reactions   Ketamine Hives, Nausea And Vomiting and Other (See Comments)    Makes the patient "feel funny" also     Physical Exam:   Vitals:   Vitals:   08/13/22 1315 08/13/22 1347  BP:  115/70  Pulse:  67  Resp:  20  Temp: 98 F (36.7 C) 98.4 F (36.9 C)  SpO2:  100%    Physical Exam: Constitutional: Patient appears well-developed and well-nourished. Not in obvious distress. HENT: Normocephalic, atraumatic, External right and left ear normal. Oropharynx is clear and moist.  Eyes: Conjunctivae and EOM are normal. PERRLA, no scleral icterus. Neck: Normal  ROM. Neck supple. No JVD. No tracheal deviation. No thyromegaly. CVS: RRR, S1/S2 +, no murmurs, no gallops, no carotid bruit.  Pulmonary: Effort and breath sounds normal, no stridor, rhonchi, wheezes, rales.  Abdominal: Soft. BS +, no distension, tenderness, rebound or guarding.  Musculoskeletal: Normal range of motion. No edema and no tenderness.  Lymphadenopathy: No lymphadenopathy noted, cervical, inguinal or axillary Neuro: Alert. Normal reflexes, muscle tone coordination. No cranial nerve deficit. Skin: Skin is warm and dry. No rash noted. Not diaphoretic. No erythema. No pallor. Psychiatric: Normal mood and affect. Behavior, judgment, thought content normal.  Data Review:   CBC Recent Labs  Lab 08/13/22 0815  WBC 9.3  HGB 10.0*  HCT 28.2*  PLT 571*  MCV 82.9  MCH 29.4  MCHC 35.5  RDW 13.7  LYMPHSABS 3.1  MONOABS 1.0  EOSABS 0.2  BASOSABS 0.0   ------------------------------------------------------------------------------------------------------------------  Chemistries  Recent Labs  Lab 08/13/22 0815  NA 139  K 3.2*  CL 108  CO2 25  GLUCOSE 104*  BUN 11  CREATININE 0.80  CALCIUM 9.6  AST 26  ALT 22  ALKPHOS 63  BILITOT 1.3*   ------------------------------------------------------------------------------------------------------------------ estimated creatinine clearance is 107.9 mL/min (by C-G formula based on SCr of 0.8 mg/dL). ------------------------------------------------------------------------------------------------------------------ No results for input(s): "TSH", "T4TOTAL", "T3FREE", "THYROIDAB" in the last 72 hours.  Invalid input(s): "FREET3"  Coagulation profile No results for input(s): "INR", "PROTIME" in the last 168 hours. ------------------------------------------------------------------------------------------------------------------- No results for input(s): "DDIMER" in the last 72  hours. -------------------------------------------------------------------------------------------------------------------  Cardiac Enzymes No results for input(s): "CKMB", "TROPONINI", "MYOGLOBIN" in the last 168 hours.  Invalid input(s): "CK" ------------------------------------------------------------------------------------------------------------------ No results found for: "BNP"  ---------------------------------------------------------------------------------------------------------------  Urinalysis    Component Value Date/Time   COLORURINE YELLOW 03/30/2022 0301   APPEARANCEUR CLEAR 03/30/2022 0301   LABSPEC 1.005 03/30/2022 0301   PHURINE 6.0 03/30/2022 0301   GLUCOSEU NEGATIVE 03/30/2022 0301   HGBUR NEGATIVE 03/30/2022 0301   BILIRUBINUR NEGATIVE 03/30/2022 0301   BILIRUBINUR negative 08/26/2021 1527   BILIRUBINUR Negative 08/10/2019 1534   KETONESUR NEGATIVE 03/30/2022 0301   PROTEINUR NEGATIVE 03/30/2022 0301   UROBILINOGEN 1.0 08/26/2021 1527   UROBILINOGEN 1.0 01/12/2018 0943   NITRITE NEGATIVE 03/30/2022 0301   LEUKOCYTESUR NEGATIVE 03/30/2022 0301    ----------------------------------------------------------------------------------------------------------------   Imaging Results:    DG Chest Portable 1 View  Result Date: 08/13/2022 CLINICAL DATA:  Sickle cell with pain in upper back and shoulders. EXAM: PORTABLE CHEST 1 VIEW COMPARISON:  Chest radiograph 05/15/2022 and 03/01/2022 FINDINGS: Small densities at the right lung base could represent atelectasis and chronic changes. Otherwise, both lungs are clear. Heart and mediastinum are within normal limits. Trachea is midline. Negative for a pneumothorax. No acute bone abnormality. IMPRESSION: No acute findings. Electronically Signed   By: Markus Daft M.D.   On: 08/13/2022 07:58     Assessment & Plan:  Active Problems:   Anemia of chronic disease   Hypokalemia   Sickle cell pain crisis (HCC)   Major  depressive disorder   Chronic pain syndrome  Sickle cell disease with pain crisis: Admit to MedSurg. Start IV fluids, 0.45% saline at 100 mL/h IV Dilaudid PCA with settings of 0.6 mg, 10-minute lockout, and 4 mg/h.  We will decrease his pain intensity improves. MS Contin 60 mg every 12 hours Hold oxycodone, will restart as pain intensity improves. Monitor vital signs very closely, reevaluate pain scale regularly, and supplemental oxygen as needed. Patient will be reevaluated for pain in the context of function and relationship to baseline as care progresses.  Chronic pain syndrome: Continue home medications  Anemia of chronic disease: Hemoglobin 10.0, no clinical indication for blood transfusion at this time being that is consistent with her baseline.  We will continue to monitor closely.  Labs in AM.  Hypokalemia: Potassium 3.2.  Replete today.  Monitor closely.  Labs in AM.  Major depressive disorder: Continue home medications.  No suicidal homicidal intentions today.  DVT Prophylaxis: Subcut Lovenox and SCDs  AM Labs Ordered, also please review Full Orders  Family Communication: Admission, patient's condition and plan of care including  tests being ordered have been discussed with the patient who indicate understanding and agree with the plan and Code Status.  Code Status: Full Code  Consults called: None    Admission status: Inpatient    Time spent in minutes : 30 minutes  [place], MSN, FNP-C [facility] [address] [place], [place] [address] [phone]  08/13/2022 at 4:09 PM

## 2022-08-14 DIAGNOSIS — E876 Hypokalemia: Secondary | ICD-10-CM

## 2022-08-14 DIAGNOSIS — F329 Major depressive disorder, single episode, unspecified: Secondary | ICD-10-CM

## 2022-08-14 DIAGNOSIS — D638 Anemia in other chronic diseases classified elsewhere: Secondary | ICD-10-CM

## 2022-08-14 DIAGNOSIS — G894 Chronic pain syndrome: Secondary | ICD-10-CM

## 2022-08-14 DIAGNOSIS — D5700 Hb-SS disease with crisis, unspecified: Secondary | ICD-10-CM | POA: Diagnosis not present

## 2022-08-14 NOTE — Progress Notes (Signed)
Patient ID: Carmen [name], female   DOB: 01-06-1991, 31 y.o.   MRN: [id number] Subjective: Carmen [name] is a 31 year old female with a medical history significant for sickle cell disease, chronic pain syndrome, opiate dependence and tolerance, and history of depression was admitted for sickle cell pain crisis.  Patient claims to be feeling a little bit better this morning.  She said her pain fluctuates between 8 and 9/10 especially in her lower back.  She is trying to ambulating.  She denies any fever.  She denies headache, cough, chest pain, shortness of breath, nausea, vomiting or diarrhea.  No urinary symptoms.  Objective:  Vital signs in last 24 hours:  Vitals:   08/14/22 0833 08/14/22 0929 08/14/22 1051 08/14/22 1357  BP:  109/60  122/81  Pulse:  64  77  Resp: '16 17 16 19  '$ Temp:  98.7 F (37.1 C)  98.1 F (36.7 C)  TempSrc:  Oral  Oral  SpO2: 98% 100% 95% 97%  Weight:      Height:        Intake/Output from previous day:   Intake/Output Summary (Last 24 hours) at 08/14/2022 1507 Last data filed at 08/14/2022 6948 Gross per 24 hour  Intake 240 ml  Output --  Net 240 ml    Physical Exam: General: Alert, awake, oriented x3, in no acute distress.  Obese. HEENT: [place]/AT [place], EOMI Neck: Trachea midline,  no masses, no thyromegal,y no JVD, no carotid bruit OROPHARYNX:  Moist, No exudate/ erythema/lesions.  Heart: Regular rate and rhythm, without murmurs, rubs, gallops, PMI non-displaced, no heaves or thrills on palpation.  Lungs: Clear to auscultation, no wheezing or rhonchi noted. No increased vocal fremitus resonant to percussion  Abdomen: Soft, nontender, nondistended, positive bowel sounds, no masses no hepatosplenomegaly noted..  Neuro: No focal neurological deficits noted cranial nerves II through XII grossly intact. DTRs 2+ bilaterally upper and lower extremities. Strength 5 out of 5 in bilateral upper and lower extremities. Musculoskeletal: No warm swelling or erythema  around joints, no spinal tenderness noted. Psychiatric: Patient alert and oriented x3, good insight and cognition, good recent to remote recall. Lymph node survey: No cervical axillary or inguinal lymphadenopathy noted.  Lab Results:  Basic Metabolic Panel:    Component Value Date/Time   NA 139 08/13/2022 0815   NA 142 07/02/2020 1040   K 3.2 (L) 08/13/2022 0815   CL 108 08/13/2022 0815   CO2 25 08/13/2022 0815   BUN 11 08/13/2022 0815   BUN 8 07/02/2020 1040   CREATININE 0.80 08/13/2022 0815   GLUCOSE 104 (H) 08/13/2022 0815   CALCIUM 9.6 08/13/2022 0815   CBC:    Component Value Date/Time   WBC 9.3 08/13/2022 0815   HGB 10.0 (L) 08/13/2022 0815   HGB 9.8 (L) 07/02/2020 1040   HCT 28.2 (L) 08/13/2022 0815   HCT 28.9 (L) 07/02/2020 1040   PLT 571 (H) 08/13/2022 0815   PLT 628 (H) 07/02/2020 1040   MCV 82.9 08/13/2022 0815   MCV 88 07/02/2020 1040   NEUTROABS 4.9 08/13/2022 0815   NEUTROABS 3.0 07/02/2020 1040   LYMPHSABS 3.1 08/13/2022 0815   LYMPHSABS 3.5 (H) 07/02/2020 1040   MONOABS 1.0 08/13/2022 0815   EOSABS 0.2 08/13/2022 0815   EOSABS 0.2 07/02/2020 1040   BASOSABS 0.0 08/13/2022 0815   BASOSABS 0.1 07/02/2020 1040    No results found for this or any previous visit (from the past 240 hour(s)).  Studies/Results: DG Chest Portable 1 View  Result Date: 08/13/2022 CLINICAL DATA:  Sickle cell with pain in upper back and shoulders. EXAM: PORTABLE CHEST 1 VIEW COMPARISON:  Chest radiograph 05/15/2022 and 03/01/2022 FINDINGS: Small densities at the right lung base could represent atelectasis and chronic changes. Otherwise, both lungs are clear. Heart and mediastinum are within normal limits. Trachea is midline. Negative for a pneumothorax. No acute bone abnormality. IMPRESSION: No acute findings. Electronically Signed   By: Markus Daft M.D.   On: 08/13/2022 07:58    Medications: Scheduled Meds:  DULoxetine  60 mg Oral Daily   enoxaparin (LOVENOX) injection  40 mg  Subcutaneous O03B   folic acid  1 mg Oral Daily   HYDROmorphone   Intravenous Q4H   ketorolac  15 mg Intravenous Q6H   morphine  60 mg Oral Q12H   potassium chloride  20 mEq Oral Daily   senna-docusate  1 tablet Oral BID   Continuous Infusions: PRN Meds:.diphenhydrAMINE, HYDROmorphone (DILAUDID) injection, lip balm, naloxone **AND** sodium chloride flush, ondansetron (ZOFRAN) IV, polyethylene glycol  Consultants: None  Procedures: None  Antibiotics: None  Assessment/Plan: Principal Problem:   Sickle cell pain crisis (HCC) Active Problems:   Anemia of chronic disease   Hypokalemia   Major depressive disorder   Chronic pain syndrome  Hb Sickle Cell Disease with Pain crisis: Continue at [facility], continue weight based Dilaudid PCA at current dose setting, continue IV Toradol 15 mg Q 6 H for total of 5 days, continue oral home pain medications as ordered.  Monitor vitals very closely, Re-evaluate pain scale regularly, 2 L of Oxygen by [place]. Anemia of Chronic Disease: Hemoglobin remained stable at baseline today.  There is no clinical indication for blood transfusion at this time.  We will continue to monitor very closely and transfuse as appropriate. Chronic pain Syndrome: Continue home pain medications as ordered. Major depressive disorder: Stable. Patient was counseled extensively.  Code Status: Full Code Family Communication: N/A Disposition Plan: Not yet ready for discharge  Inge Waldroup  If 7PM-7AM, please contact night-coverage.  08/14/2022, 3:07 PM  LOS: 1 day

## 2022-08-15 DIAGNOSIS — D638 Anemia in other chronic diseases classified elsewhere: Secondary | ICD-10-CM | POA: Diagnosis not present

## 2022-08-15 DIAGNOSIS — E876 Hypokalemia: Secondary | ICD-10-CM | POA: Diagnosis not present

## 2022-08-15 DIAGNOSIS — G894 Chronic pain syndrome: Secondary | ICD-10-CM | POA: Diagnosis not present

## 2022-08-15 DIAGNOSIS — D5700 Hb-SS disease with crisis, unspecified: Secondary | ICD-10-CM | POA: Diagnosis not present

## 2022-08-15 LAB — CBC WITH DIFFERENTIAL/PLATELET
Abs Immature Granulocytes: 0.05 10*3/uL (ref 0.00–0.07)
Basophils Absolute: 0 10*3/uL (ref 0.0–0.1)
Basophils Relative: 0 %
Eosinophils Absolute: 0.2 10*3/uL (ref 0.0–0.5)
Eosinophils Relative: 2 %
HCT: 27.1 % — ABNORMAL LOW (ref 36.0–46.0)
Hemoglobin: 9.6 g/dL — ABNORMAL LOW (ref 12.0–15.0)
Immature Granulocytes: 1 %
Lymphocytes Relative: 36 %
Lymphs Abs: 3.6 10*3/uL (ref 0.7–4.0)
MCH: 29.7 pg (ref 26.0–34.0)
MCHC: 35.4 g/dL (ref 30.0–36.0)
MCV: 83.9 fL (ref 80.0–100.0)
Monocytes Absolute: 1.1 10*3/uL — ABNORMAL HIGH (ref 0.1–1.0)
Monocytes Relative: 11 %
Neutro Abs: 4.9 10*3/uL (ref 1.7–7.7)
Neutrophils Relative %: 50 %
Platelets: 468 10*3/uL — ABNORMAL HIGH (ref 150–400)
RBC: 3.23 MIL/uL — ABNORMAL LOW (ref 3.87–5.11)
RDW: 13.9 % (ref 11.5–15.5)
WBC: 9.8 10*3/uL (ref 4.0–10.5)
nRBC: 0.4 % — ABNORMAL HIGH (ref 0.0–0.2)

## 2022-08-15 LAB — BASIC METABOLIC PANEL
Anion gap: 8 (ref 5–15)
BUN: 11 mg/dL (ref 6–20)
CO2: 23 mmol/L (ref 22–32)
Calcium: 9.3 mg/dL (ref 8.9–10.3)
Chloride: 107 mmol/L (ref 98–111)
Creatinine, Ser: 0.57 mg/dL (ref 0.44–1.00)
GFR, Estimated: >60 mL/min (ref >60)
Glucose, Bld: 93 mg/dL (ref 70–99)
Potassium: 4.6 mmol/L (ref 3.5–5.1)
Sodium: 138 mmol/L (ref 135–145)

## 2022-08-15 MED ORDER — OXYCODONE HCL 5 MG PO TABS
10.0000 mg | ORAL_TABLET | Freq: Four times a day (QID) | ORAL | Status: DC | PRN
  Administered 2022-08-15 – 2022-08-18 (×10): 10 mg via ORAL
  Filled 2022-08-15 (×10): qty 2

## 2022-08-15 NOTE — Progress Notes (Signed)
Patient ID: Carmen [name], female   DOB: 1991-09-05, 31 y.o.   MRN: [id number] Subjective: Carmen [name] is a 31 year old female with a medical history significant for sickle cell disease, chronic pain syndrome, opiate dependence and tolerance, and history of depression was admitted for sickle cell pain crisis.  Patient is complaining of a new pain in her left thigh which started this morning, she claims the pain in her legs resolved, and her lower back is improving but the new thigh pain is about 8/10 in intensity.  There is no swelling or redness.  No history of trauma.  She denies any fever, cough, chest pain, shortness of breath, nausea, vomiting or diarrhea.  No urinary symptoms.  Objective:  Vital signs in last 24 hours:  Vitals:   08/15/22 0503 08/15/22 0827 08/15/22 1005 08/15/22 1157  BP:   113/79   Pulse:   77   Resp: '15 16 18 16  '$ Temp:   99.3 F (37.4 C)   TempSrc:   Oral   SpO2:  97% 100% 98%  Weight:      Height:        Intake/Output from previous day:   Intake/Output Summary (Last 24 hours) at 08/15/2022 1217 Last data filed at 08/15/2022 0827 Gross per 24 hour  Intake 410.4 ml  Output --  Net 410.4 ml     Physical Exam: General: Alert, awake, oriented x3, in no acute distress.  Obese. HEENT: [place]/AT [place], EOMI Neck: Trachea midline,  no masses, no thyromegal,y no JVD, no carotid bruit OROPHARYNX:  Moist, No exudate/ erythema/lesions.  Heart: Regular rate and rhythm, without murmurs, rubs, gallops, PMI non-displaced, no heaves or thrills on palpation.  Lungs: Clear to auscultation, no wheezing or rhonchi noted. No increased vocal fremitus resonant to percussion  Abdomen: Soft, nontender, nondistended, positive bowel sounds, no masses no hepatosplenomegaly noted..  Neuro: No focal neurological deficits noted cranial nerves II through XII grossly intact. DTRs 2+ bilaterally upper and lower extremities. Strength 5 out of 5 in bilateral upper and lower  extremities. Musculoskeletal: No warm swelling or erythema around joints, no spinal tenderness noted. Psychiatric: Patient alert and oriented x3, good insight and cognition, good recent to remote recall. Lymph node survey: No cervical axillary or inguinal lymphadenopathy noted.  Lab Results:  Basic Metabolic Panel:    Component Value Date/Time   NA 138 08/15/2022 0556   NA 142 07/02/2020 1040   K 4.6 08/15/2022 0556   CL 107 08/15/2022 0556   CO2 23 08/15/2022 0556   BUN 11 08/15/2022 0556   BUN 8 07/02/2020 1040   CREATININE 0.57 08/15/2022 0556   GLUCOSE 93 08/15/2022 0556   CALCIUM 9.3 08/15/2022 0556   CBC:    Component Value Date/Time   WBC 9.8 08/15/2022 0556   HGB 9.6 (L) 08/15/2022 0556   HGB 9.8 (L) 07/02/2020 1040   HCT 27.1 (L) 08/15/2022 0556   HCT 28.9 (L) 07/02/2020 1040   PLT 468 (H) 08/15/2022 0556   PLT 628 (H) 07/02/2020 1040   MCV 83.9 08/15/2022 0556   MCV 88 07/02/2020 1040   NEUTROABS 4.9 08/15/2022 0556   NEUTROABS 3.0 07/02/2020 1040   LYMPHSABS 3.6 08/15/2022 0556   LYMPHSABS 3.5 (H) 07/02/2020 1040   MONOABS 1.1 (H) 08/15/2022 0556   EOSABS 0.2 08/15/2022 0556   EOSABS 0.2 07/02/2020 1040   BASOSABS 0.0 08/15/2022 0556   BASOSABS 0.1 07/02/2020 1040    No results found for this or any previous visit (from  the past 240 hour(s)).  Studies/Results: No results found.  Medications: Scheduled Meds:  DULoxetine  60 mg Oral Daily   enoxaparin (LOVENOX) injection  40 mg Subcutaneous A35T   folic acid  1 mg Oral Daily   HYDROmorphone   Intravenous Q4H   ketorolac  15 mg Intravenous Q6H   morphine  60 mg Oral Q12H   potassium chloride  20 mEq Oral Daily   senna-docusate  1 tablet Oral BID   Continuous Infusions: PRN Meds:.diphenhydrAMINE, HYDROmorphone (DILAUDID) injection, lip balm, naloxone **AND** sodium chloride flush, ondansetron (ZOFRAN) IV, oxyCODONE, polyethylene  glycol  Consultants: None  Procedures: None  Antibiotics: None  Assessment/Plan: Principal Problem:   Sickle cell pain crisis (HCC) Active Problems:   Anemia of chronic disease   Hypokalemia   Major depressive disorder   Chronic pain syndrome  Hb Sickle Cell Disease with Pain crisis: IV fluid at [facility], continue weight based Dilaudid PCA at current dose setting, continue IV Toradol 15 mg Q 6 H for total of 5 days, add home oxycodone and continue MS Contin as ordered. Monitor vitals very closely, Re-evaluate pain scale regularly, 2 L of Oxygen by [place].  Encouraged to ambulate on the hallway. Anemia of Chronic Disease: Hemoglobin remained stable at baseline today. There is no clinical indication for blood transfusion at this time. We will continue to monitor very closely and transfuse as appropriate. Chronic pain Syndrome: Continue home pain medications as ordered. Major depressive disorder: Stable.  She denies any suicidal ideations or thoughts. Patient was counseled extensively.  Code Status: Full Code Family Communication: N/A Disposition Plan: Not yet ready for discharge  Carmen [name]  If 7PM-7AM, please contact night-coverage.  08/15/2022, 12:17 PM  LOS: 2 days

## 2022-08-16 DIAGNOSIS — D5700 Hb-SS disease with crisis, unspecified: Secondary | ICD-10-CM | POA: Diagnosis not present

## 2022-08-16 MED ORDER — ALPRAZOLAM 0.5 MG PO TABS
0.5000 mg | ORAL_TABLET | Freq: Once | ORAL | Status: AC
  Administered 2022-08-16: 0.5 mg via ORAL
  Filled 2022-08-16 (×2): qty 1

## 2022-08-16 NOTE — TOC Initial Note (Signed)
Transition of Care [facility]) - Initial/Assessment Note    Patient Details  Name: Carmen [name] MRN: [id number] Date of Birth: 09-23-1991  Transition of Care [facility]) CM/SW Contact:    Angelita Ingles, RN Phone [phone]  08/16/2022, 3:01 PM  Clinical Narrative:                 TOC following  31 y.o. female with a medical history significant for sickle cell disease admitted with worsening low back pain. TOC following for patient admitted as high risk for readmission. CM at bedside for assessment. Patient states that she is from home where she lives with her son and mother. Patient has PCP [name] FNP and follows up at the [facility]. Patient states that she does have transportation through medicaid transportation services and sickle cell transportation services. Patient has no DME, no home health services. Per patient medications are affordable with a $4 copay. Patient currently has no needs. Resources have been added for transportation and food insecurities per consult.  Transition of Care [facility]) Screening Note   Patient Details  Name: Carmen [name] Date of Birth: 16-Jan-1991   Transition of Care [facility]) CM/SW Contact:    Angelita Ingles, RN Phone Number: 08/16/2022, 3:11 PM    Transition of Care Department [facility]) has reviewed patient and no TOC needs have been identified at this time. We will continue to monitor patient advancement through interdisciplinary progression rounds.     Expected Discharge Plan: Home/Self Care Barriers to Discharge: Continued Medical Work up   Patient Goals and CMS Choice Patient states their goals for this hospitalization and ongoing recovery are:: For pain to get better to go home CMS Medicare.gov Compare Post Acute Care list provided to::  (n/a) Choice offered to / list presented to : NA  Expected Discharge Plan and Services Expected Discharge Plan: Home/Self Care In-house Referral: NA Discharge Planning Services: CM Consult Post  Acute Care Choice: NA Living arrangements for the past 2 months: Apartment                 DME Arranged: N/A DME Agency: NA       HH Arranged: NA [place] Agency: NA        Prior Living Arrangements/Services Living arrangements for the past 2 months: Apartment Lives with:: Minor Children, Parents Patient language and need for interpreter reviewed:: Yes Do you feel safe going back to the place where you live?: Yes      Need for Family Participation in Patient Care: No (Comment) Care giver support system in place?: Yes (comment) Current home services:  (none) Criminal Activity/Legal Involvement Pertinent to Current Situation/Hospitalization: No - Comment as needed  Activities of Daily Living Home Assistive Devices/Equipment: Eyeglasses ADL Screening (condition at time of admission) Patient's cognitive ability adequate to safely complete daily activities?: Yes Is the patient deaf or have difficulty hearing?: No Does the patient have difficulty seeing, even when wearing glasses/contacts?: No Does the patient have difficulty concentrating, remembering, or making decisions?: No Patient able to express need for assistance with ADLs?: Yes Does the patient have difficulty dressing or bathing?: No Independently performs ADLs?: Yes (appropriate for developmental age) Does the patient have difficulty walking or climbing stairs?: No Weakness of Legs: Both Weakness of Arms/Hands: None  Permission Sought/Granted Permission sought to share information with : Family Supports Permission granted to share information with : No              Emotional Assessment Appearance:: Appears stated  age Attitude/Demeanor/Rapport: Engaged Affect (typically observed): Pleasant, Quiet Orientation: : Oriented to Self, Oriented to Place, Oriented to  Time, Oriented to Situation Alcohol / Substance Use: Not Applicable Psych Involvement: No (comment)  Admission diagnosis:  Sickle cell pain crisis ([place])  [D57.00] Patient Active Problem List   Diagnosis Date Noted   Nausea & vomiting 03/03/2022   Acute chest syndrome due to sickle cell crisis ([place]) 09/05/2021   Chronic pain syndrome 08/21/2021   Moderate episode of recurrent major depressive disorder ([place]) 05/28/2021   Major depressive disorder 05/22/2021   Tobacco abuse 05/21/2021   Sickle cell disease with crisis ([place]) 03/23/2021   Acute pain of right shoulder    Sickle cell anemia with crisis ([place]) 02/16/2021   Community acquired pneumonia of right lower lobe of lung    Fever of unknown origin 10/14/2020   Sickle cell crisis ([place]) 03/31/2020   Bacterial vaginitis 01/04/2020   Acute cystitis without hematuria    Dysuria 12/31/2019   Urine leukocytes    Sickle cell pain crisis ([place]) 09/10/2019   Sickle cell disease ([place]) 12/04/2018   Alpha thalassemia silent carrier 12/04/2018   Chronic prescription opiate use 03/13/2018   Chronic musculoskeletal pain 03/13/2018   Vitamin D deficiency 01/13/2018   Leukocytosis 08/21/2017   Hypokalemia 06/27/2017   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[place]) 04/05/2017   Constipation due to pain medication    Hb-SS disease without crisis ([place]) 08/15/2016   Anemia of chronic disease    Chest pain 11/10/2015   Thrombocytosis 54/65/6812   Systolic ejection murmur 75/17/0017   Sickle cell anemia of mother during pregnancy ([place]) 05/16/2014   PCP:  [name], FNP Pharmacy:  No Pharmacies Listed    Social Determinants of Health (SDOH) Interventions    Readmission Risk Interventions    08/16/2022    2:56 PM 05/16/2022    2:52 PM 04/08/2022    9:35 AM  Readmission Risk Prevention Plan  Transportation Screening Complete Complete Complete  Medication Review ([place]) Complete Complete   PCP or Specialist appointment within 3-5 days of discharge Complete Complete Complete  HRI or Home Care Consult Complete Complete Complete  SW Recovery Care/Counseling Consult Complete  Complete Complete  Palliative Care Screening Not Applicable  Not [place] Not Applicable Not Applicable Not Applicable

## 2022-08-16 NOTE — Progress Notes (Signed)
Subjective: Elvira Langston is a 31 year old female with a medical history significant for sickle cell disease, chronic pain syndrome, opiate dependence and tolerance, and history of depression was admitted for sickle cell pain crisis. Patient is complaining of new pain to right upper back, she says that this area has been swollen overnight.  She is also complaining of pain to her left thigh, which started on yesterday.  She says that pain in her low back is improving.  Overall, her pain intensity is 8/10. She denies any headache, chest pain, shortness of breath, nausea, vomiting, or diarrhea.  Objective:  Vital signs in last 24 hours:  Vitals:   08/16/22 0453 08/16/22 0502 08/16/22 0722 08/16/22 1031  BP: 103/63   (!) 119/98  Pulse: 84   71  Resp: '14 16 15 18  '$ Temp: 98.6 F (37 C)   99.3 F (37.4 C)  TempSrc: Oral   Oral  SpO2: 98% 98% 98% 99%  Weight:      Height:        Intake/Output from previous day:   Intake/Output Summary (Last 24 hours) at 08/16/2022 1147 Last data filed at 08/15/2022 1641 Gross per 24 hour  Intake 493.2 ml  Output --  Net 493.2 ml    Physical Exam: General: Alert, awake, oriented x3, in no acute distress.  HEENT: [place]/AT [place], EOMI Neck: Trachea midline,  no masses, no thyromegal,y no JVD, no carotid bruit OROPHARYNX:  Moist, No exudate/ erythema/lesions.  Heart: Regular rate and rhythm, without murmurs, rubs, gallops, PMI non-displaced, no heaves or thrills on palpation.  Lungs: Clear to auscultation, no wheezing or rhonchi noted. No increased vocal fremitus resonant to percussion  Abdomen: Soft, nontender, nondistended, positive bowel sounds, no masses no hepatosplenomegaly noted..  Neuro: No focal neurological deficits noted cranial nerves II through XII grossly intact. DTRs 2+ bilaterally upper and lower extremities. Strength 5 out of 5 in bilateral upper and lower extremities. Musculoskeletal: No warm swelling or erythema around joints, no spinal  tenderness noted. Psychiatric: Patient alert and oriented x3, good insight and cognition, good recent to remote recall. Lymph node survey: No cervical axillary or inguinal lymphadenopathy noted.  Lab Results:  Basic Metabolic Panel:    Component Value Date/Time   NA 138 08/15/2022 0556   NA 142 07/02/2020 1040   K 4.6 08/15/2022 0556   CL 107 08/15/2022 0556   CO2 23 08/15/2022 0556   BUN 11 08/15/2022 0556   BUN 8 07/02/2020 1040   CREATININE 0.57 08/15/2022 0556   GLUCOSE 93 08/15/2022 0556   CALCIUM 9.3 08/15/2022 0556   CBC:    Component Value Date/Time   WBC 9.8 08/15/2022 0556   HGB 9.6 (L) 08/15/2022 0556   HGB 9.8 (L) 07/02/2020 1040   HCT 27.1 (L) 08/15/2022 0556   HCT 28.9 (L) 07/02/2020 1040   PLT 468 (H) 08/15/2022 0556   PLT 628 (H) 07/02/2020 1040   MCV 83.9 08/15/2022 0556   MCV 88 07/02/2020 1040   NEUTROABS 4.9 08/15/2022 0556   NEUTROABS 3.0 07/02/2020 1040   LYMPHSABS 3.6 08/15/2022 0556   LYMPHSABS 3.5 (H) 07/02/2020 1040   MONOABS 1.1 (H) 08/15/2022 0556   EOSABS 0.2 08/15/2022 0556   EOSABS 0.2 07/02/2020 1040   BASOSABS 0.0 08/15/2022 0556   BASOSABS 0.1 07/02/2020 1040    No results found for this or any previous visit (from the past 240 hour(s)).  Studies/Results: No results found.  Medications: Scheduled Meds:  DULoxetine  60 mg Oral Daily  enoxaparin (LOVENOX) injection  40 mg Subcutaneous Y77A   folic acid  1 mg Oral Daily   HYDROmorphone   Intravenous Q4H   ketorolac  15 mg Intravenous Q6H   morphine  60 mg Oral Q12H   potassium chloride  20 mEq Oral Daily   senna-docusate  1 tablet Oral BID   Continuous Infusions: PRN Meds:.diphenhydrAMINE, lip balm, naloxone **AND** sodium chloride flush, ondansetron (ZOFRAN) IV, oxyCODONE, polyethylene glycol  Consultants: none  Procedures: none  Antibiotics: none  Assessment/Plan: Principal Problem:   Sickle cell pain crisis (HCC) Active Problems:   Anemia of chronic  disease   Hypokalemia   Major depressive disorder   Chronic pain syndrome  Sickle cell disease with pain crisis: Continue IV Dilaudid PCA at current settings Toradol 15 mg every 6 hours for total of 5 days Home oxycodone and MS Contin were added.  Monitor vital signs closely, reevaluate pain scale regularly, and supplemental oxygen as needed.  Anemia of chronic disease: Hemoglobin stable and consistent with patient's baseline.  There is no clinical indication for blood transfusion at this time.  Continue to follow closely.  Chronic pain syndrome: Continue home medications  Major depressive disorder: Stable.  No suicidal homicidal thoughts.  Patient counseled on admission.   Code Status: Full Code Family Communication: N/A Disposition Plan: Not yet ready for discharge  [place], MSN, FNP-C [facility] [address] [place], [place] [address] [phone]  If 7PM-7AM, please contact night-coverage.  08/16/2022, 11:47 AM  LOS: 3 days

## 2022-08-16 NOTE — Progress Notes (Signed)
Mobility Specialist - Progress Note   08/16/22 1347  Mobility  Activity Ambulated independently in hallway  Level of Assistance Independent after set-up  Assistive Device Other (Comment) (IV pole)  Distance Ambulated (ft) 2000 ft  Activity Response Tolerated well  Mobility Referral Yes  $Mobility charge 1 Mobility   Pt received in bed and agreed for mobility, some pain in L thigh, went away during session. Pt back to bed with all needs met.   Roderick Pee Mobility Specialist

## 2022-08-17 DIAGNOSIS — D5700 Hb-SS disease with crisis, unspecified: Secondary | ICD-10-CM | POA: Diagnosis not present

## 2022-08-17 LAB — BASIC METABOLIC PANEL
Anion gap: 7 (ref 5–15)
BUN: 12 mg/dL (ref 6–20)
CO2: 27 mmol/L (ref 22–32)
Calcium: 9.9 mg/dL (ref 8.9–10.3)
Chloride: 105 mmol/L (ref 98–111)
Creatinine, Ser: 0.59 mg/dL (ref 0.44–1.00)
GFR, Estimated: >60 mL/min (ref >60)
Glucose, Bld: 104 mg/dL — ABNORMAL HIGH (ref 70–99)
Potassium: 4.1 mmol/L (ref 3.5–5.1)
Sodium: 139 mmol/L (ref 135–145)

## 2022-08-17 LAB — CBC
HCT: 28.7 % — ABNORMAL LOW (ref 36.0–46.0)
Hemoglobin: 10.2 g/dL — ABNORMAL LOW (ref 12.0–15.0)
MCH: 29.6 pg (ref 26.0–34.0)
MCHC: 35.5 g/dL (ref 30.0–36.0)
MCV: 83.2 fL (ref 80.0–100.0)
Platelets: 464 10*3/uL — ABNORMAL HIGH (ref 150–400)
RBC: 3.45 MIL/uL — ABNORMAL LOW (ref 3.87–5.11)
RDW: 13.9 % (ref 11.5–15.5)
WBC: 11.6 10*3/uL — ABNORMAL HIGH (ref 4.0–10.5)
nRBC: 0.3 % — ABNORMAL HIGH (ref 0.0–0.2)

## 2022-08-17 MED ORDER — HYDROMORPHONE 1 MG/ML IV SOLN
INTRAVENOUS | Status: DC
  Administered 2022-08-17: 4.9 mg via INTRAVENOUS
  Administered 2022-08-17: 3.5 mg via INTRAVENOUS
  Administered 2022-08-17: 16 mg via INTRAVENOUS
  Administered 2022-08-18: 7.7 mg via INTRAVENOUS
  Administered 2022-08-18: 9 mg via INTRAVENOUS
  Administered 2022-08-18: 5.5 mg via INTRAVENOUS
  Administered 2022-08-18: 5 mg via INTRAVENOUS
  Filled 2022-08-17: qty 30

## 2022-08-17 MED ORDER — CYCLOBENZAPRINE HCL 5 MG PO TABS
5.0000 mg | ORAL_TABLET | Freq: Three times a day (TID) | ORAL | Status: DC | PRN
  Administered 2022-08-17 – 2022-08-18 (×3): 5 mg via ORAL
  Filled 2022-08-17 (×3): qty 1

## 2022-08-17 NOTE — Plan of Care (Signed)
  Problem: Self-Care: Goal: Ability to incorporate actions that prevent/reduce pain crisis will improve Outcome: Progressing   Problem: Activity: Goal: Risk for activity intolerance will decrease Outcome: Progressing   Problem: Pain Managment: Goal: General experience of comfort will improve Outcome: Progressing

## 2022-08-17 NOTE — Progress Notes (Signed)
Mobility Specialist - Progress Note   08/17/22 1138  Mobility  Activity Ambulated with assistance in hallway  Level of Assistance Modified independent, requires aide device or extra time  Assistive Device Other (Comment) (IV POLE)  Distance Ambulated (ft) 300 ft  Activity Response Tolerated well  Mobility Referral Yes   Pt received in bed and disagreed to mobility but requested a spoon, made a compromise to walk with me to get a spoon. Pt felt good during and after session, requested to ambulate again in afternoon. Pt back to bed with all needs met.   Roderick Pee Mobility Specialist

## 2022-08-17 NOTE — Progress Notes (Signed)
Subjective: Carmen [name] is a 31 year old female with a medical history significant for sickle cell disease, chronic pain syndrome, opiate dependence and tolerance, and history of depression was admitted for sickle cell pain crisis. Today, patient is complaining of pain to her left thigh, which started several days ago and has not been relieved on current medications..  She says that pain in her low back is improving.  Overall, her pain intensity is 8/10. She denies any headache, chest pain, shortness of breath, nausea, vomiting, or diarrhea.  Objective:  Vital signs in last 24 hours:  Vitals:   08/17/22 0153 08/17/22 0323 08/17/22 0509 08/17/22 0651  BP: 136/82  139/82   Pulse: 75  68   Resp: '14 18 14 16  '$ Temp: 98.5 F (36.9 C)  98.5 F (36.9 C)   TempSrc: Oral  Oral   SpO2: 98% 98% 97% 98%  Weight:      Height:        Intake/Output from previous day:   Intake/Output Summary (Last 24 hours) at 08/17/2022 0858 Last data filed at 08/16/2022 2210 Gross per 24 hour  Intake 240 ml  Output --  Net 240 ml    Physical Exam: General: Alert, awake, oriented x3, in no acute distress.  HEENT: [place]/AT [place]ER[place], EOMI Neck: Trachea midline,  no masses, no thyromegal,y no JVD, no carotid bruit OROPHARYNX:  Moist, No exudate/ erythema/lesions.  Heart: Regular rate and rhythm, without murmurs, rubs, gallops, PMI non-displaced, no heaves or thrills on palpation.  Lungs: Clear to auscultation, no wheezing or rhonchi noted. No increased vocal fremitus resonant to percussion  Abdomen: Soft, nontender, nondistended, positive bowel sounds, no masses no hepatosplenomegaly noted..  Neuro: No focal neurological deficits noted cranial nerves II through XII grossly intact. DTRs 2+ bilaterally upper and lower extremities. Strength 5 out of 5 in bilateral upper and lower extremities. Musculoskeletal: No warm swelling or erythema around joints, no spinal tenderness noted. Psychiatric: Patient alert and  oriented x3, good insight and cognition, good recent to remote recall. Lymph node survey: No cervical axillary or inguinal lymphadenopathy noted.  Lab Results:  Basic Metabolic Panel:    Component Value Date/Time   NA 138 08/15/2022 0556   NA 142 07/02/2020 1040   K 4.6 08/15/2022 0556   CL 107 08/15/2022 0556   CO2 23 08/15/2022 0556   BUN 11 08/15/2022 0556   BUN 8 07/02/2020 1040   CREATININE 0.57 08/15/2022 0556   GLUCOSE 93 08/15/2022 0556   CALCIUM 9.3 08/15/2022 0556   CBC:    Component Value Date/Time   WBC 9.8 08/15/2022 0556   HGB 9.6 (L) 08/15/2022 0556   HGB 9.8 (L) 07/02/2020 1040   HCT 27.1 (L) 08/15/2022 0556   HCT 28.9 (L) 07/02/2020 1040   PLT 468 (H) 08/15/2022 0556   PLT 628 (H) 07/02/2020 1040   MCV 83.9 08/15/2022 0556   MCV 88 07/02/2020 1040   NEUTROABS 4.9 08/15/2022 0556   NEUTROABS 3.0 07/02/2020 1040   LYMPHSABS 3.6 08/15/2022 0556   LYMPHSABS 3.5 (H) 07/02/2020 1040   MONOABS 1.1 (H) 08/15/2022 0556   EOSABS 0.2 08/15/2022 0556   EOSABS 0.2 07/02/2020 1040   BASOSABS 0.0 08/15/2022 0556   BASOSABS 0.1 07/02/2020 1040    No results found for this or any previous visit (from the past 240 hour(s)).  Studies/Results: No results found.  Medications: Scheduled Meds:  DULoxetine  60 mg Oral Daily   enoxaparin (LOVENOX) injection  40 mg Subcutaneous Q24H  folic acid  1 mg Oral Daily   HYDROmorphone   Intravenous Q4H   ketorolac  15 mg Intravenous Q6H   morphine  60 mg Oral Q12H   potassium chloride  20 mEq Oral Daily   senna-docusate  1 tablet Oral BID   Continuous Infusions: PRN Meds:.diphenhydrAMINE, lip balm, naloxone **AND** sodium chloride flush, ondansetron (ZOFRAN) IV, oxyCODONE, polyethylene glycol  Consultants: none  Procedures: none  Antibiotics: none  Assessment/Plan: Principal Problem:   Sickle cell pain crisis (HCC) Active Problems:   Anemia of chronic disease   Hypokalemia   Major depressive disorder    Chronic pain syndrome  Sickle cell disease with pain crisis: Weaning IV Dilaudid PCA Toradol 15 mg every 6 hours for total of 5 days Continue MS Contin Oxycodone 10 mg every 6 hours as needed for severe breakthrough pain Flexeril 5 mg 3 times per day as needed Monitor vital signs closely, reevaluate pain scale regularly, and supplemental oxygen as needed.  Anemia of chronic disease: Hemoglobin stable and consistent with patient's baseline.  There is no clinical indication for blood transfusion at this time.  Continue to follow closely.  Chronic pain syndrome: Continue home medications  Major depressive disorder: Stable.  No suicidal homicidal thoughts.  Patient counseled on admission.   Code Status: Full Code Family Communication: N/A Disposition Plan: Not yet ready for discharge  [place], MSN, FNP-C [facility] [place] [address] [place], [place] [address] [phone]  If 7PM-7AM, please contact night-coverage.  08/17/2022, 8:58 AM  LOS: 4 days

## 2022-08-18 DIAGNOSIS — D5700 Hb-SS disease with crisis, unspecified: Secondary | ICD-10-CM | POA: Diagnosis not present

## 2022-08-18 MED ORDER — MORPHINE SULFATE ER 60 MG PO TBCR: 60.0000 mg | EXTENDED_RELEASE_TABLET | Freq: Two times a day (BID) | ORAL | 0 refills | Status: DC

## 2022-08-18 MED ORDER — OXYCODONE HCL 10 MG PO TABS: 10.0000 mg | ORAL_TABLET | Freq: Four times a day (QID) | ORAL | 0 refills | Status: DC | PRN

## 2022-08-18 NOTE — Plan of Care (Signed)
  Problem: Education: Goal: Knowledge of vaso-occlusive preventative measures will improve Outcome: Adequate for Discharge Goal: Awareness of infection prevention will improve Outcome: Adequate for Discharge Goal: Awareness of signs and symptoms of anemia will improve Outcome: Adequate for Discharge Goal: Long-term complications will improve Outcome: Adequate for Discharge   Problem: Self-Care: Goal: Ability to incorporate actions that prevent/reduce pain crisis will improve Outcome: Adequate for Discharge   Problem: Bowel/Gastric: Goal: Gut motility will be maintained Outcome: Adequate for Discharge   Problem: Tissue Perfusion: Goal: Complications related to inadequate tissue perfusion will be avoided or minimized Outcome: Adequate for Discharge   Problem: Respiratory: Goal: Pulmonary complications will be avoided or minimized Outcome: Adequate for Discharge Goal: Acute Chest Syndrome will be identified early to prevent complications Outcome: Adequate for Discharge   Problem: Fluid Volume: Goal: Ability to maintain a balanced intake and output will improve Outcome: Adequate for Discharge   Problem: Sensory: Goal: Pain level will decrease with appropriate interventions Outcome: Adequate for Discharge   Problem: Health Behavior: Goal: Postive changes in compliance with treatment and prescription regimens will improve Outcome: Adequate for Discharge   Problem: Education: Goal: Knowledge of General Education information will improve Description: Including pain rating scale, medication(s)/side effects and non-pharmacologic comfort measures Outcome: Adequate for Discharge   Problem: Health Behavior/Discharge Planning: Goal: Ability to manage health-related needs will improve Outcome: Adequate for Discharge   Problem: Clinical Measurements: Goal: Ability to maintain clinical measurements within normal limits will improve Outcome: Adequate for Discharge Goal: Will remain  free from infection Outcome: Adequate for Discharge Goal: Diagnostic test results will improve Outcome: Adequate for Discharge Goal: Respiratory complications will improve Outcome: Adequate for Discharge Goal: Cardiovascular complication will be avoided Outcome: Adequate for Discharge   Problem: Activity: Goal: Risk for activity intolerance will decrease Outcome: Adequate for Discharge   Problem: Nutrition: Goal: Adequate nutrition will be maintained Outcome: Adequate for Discharge   Problem: Coping: Goal: Level of anxiety will decrease Outcome: Adequate for Discharge   Problem: Elimination: Goal: Will not experience complications related to bowel motility Outcome: Adequate for Discharge Goal: Will not experience complications related to urinary retention Outcome: Adequate for Discharge   Problem: Pain Managment: Goal: General experience of comfort will improve Outcome: Adequate for Discharge   Problem: Safety: Goal: Ability to remain free from injury will improve Outcome: Adequate for Discharge   Problem: Skin Integrity: Goal: Risk for impaired skin integrity will decrease Outcome: Adequate for Discharge   

## 2022-08-18 NOTE — Discharge Summary (Signed)
Physician Discharge Summary  Carmen [name]:[id number] DOB: [birth year] DOA: 08/13/2022  PCP: [name], FNP  Admit date: 08/13/2022  Discharge date: 08/18/2022  Discharge Diagnoses:  Principal Problem:   Sickle cell pain crisis ([place]) Active Problems:   Anemia of chronic disease   Hypokalemia   Major depressive disorder   Chronic pain syndrome   Discharge Condition: Stable  Disposition:   Follow-up Information     [name], FNP Follow up in 1 week(s).   Specialty: Family Medicine Contact information: [place]. [place] [address] [phone]                Pt is discharged home in good condition and is to follow up with [name], FNP this week to have labs evaluated. Carmen [name] is instructed to increase activity slowly and balance with rest for the next few days, and use prescribed medication to complete treatment of pain  Diet: Regular Wt Readings from Last 3 Encounters:  08/13/22 88.3 kg  07/14/22 83.9 kg  06/10/22 83.9 kg    History of present illness:  Carmen [name]  is a 31 y.o. female with a medical history significant for sickle cell disease, chronic pain syndrome, opiate dependence and tolerance, major depressive disorder, and anemia of chronic disease presents to the emergency department with worsening low back pain over the past 2 days.  Carmen says that low back pain is consistent with her typical sickle cell pain crisis.  Carmen was in her usual state of health until 2 days prior when pain intensity increased.  She attributes current pain crisis to sleeping with her air conditioner running.  She says that she has been taking her home MS Contin and oxycodone without very much relief.  She has not had any recent falls or injuries.  She denies any objective fever, chills, or rigors.  No chest pain, urinary symptoms, nausea, vomiting, or diarrhea.  There is been no recent travel, sick contacts, or known  exposure to COVID-19.   ER course: While in the ER, Carmen's vital signs remained stable.  Upon arriving to unit vital signs are recorded as:BP 115/70 (BP Location: Right Arm)   Pulse 67   Temp 98.4 F (36.9 C) (Oral)   Resp 20   Ht '5\' 3"'$  (1.6 m)   Wt 86.2 kg   LMP 08/10/2022   SpO2 100%   BMI 33.66 kg/m  Carmen's labs are notable for hemoglobin 10.0, platelets 571,000, and WBCs 9.3.  Complete metabolic panel shows potassium 3.2, and total bilirubin 1.3, otherwise unremarkable.  Chest x-ray shows no acute cardiopulmonary process.  Carmen's pain persists despite IV Dilaudid, IV Toradol, and IV fluids.  Carmen admitted for further management of her sickle cell pain crisis.  Hospital Course:  Sickle cell disease with pain crisis: Carmen was admitted for sickle cell pain crisis and managed appropriately with IVF, IV Dilaudid via PCA and IV Toradol, as well as other adjunct therapies per sickle cell pain management protocols.  During admission, Carmen's pain was localized to left thigh and was slow to respond to pain medication regimen.  Eventually IV Dilaudid PCA was weaned and Carmen transition to her home medications.  She will resume her home medications of MS Contin and oxycodone.  Both medications were sent to Carmen's pharmacy today.  Reviewed PDMP prior to prescribing opiate medications and no inconsistencies were noted.  Carmen [name] has an appointment scheduled with PCP on 08/24/2022 and at that time  we will repeat labs.  Carmen is alert, oriented, and was ambulating in room upon arrival.  She states that she can manage at home and current pain intensity.  She rates her pain as 6/10.  She is aware of her upcoming appointment. Carmen was therefore discharged home today in a hemodynamically stable condition.   Carmen [name] will follow-up with PCP within 1 week of this discharge. Carmen [name] was counseled extensively about nonpharmacologic means of pain management, Carmen verbalized understanding and  was appreciative of  the care received during this admission.   We discussed the need for good hydration, monitoring of hydration status, avoidance of heat, cold, stress, and infection triggers. We discussed the need to be adherent with taking home medications. Carmen was reminded of the need to seek medical attention immediately if any symptom of bleeding, anemia, or infection occurs.  Discharge Exam: Vitals:   08/18/22 0549 08/18/22 1058  BP: 127/84 127/65  Pulse: 99 84  Resp: 16 20  Temp: 98 F (36.7 C) 98.7 F (37.1 C)  SpO2: 100%    Vitals:   08/18/22 0013 08/18/22 0401 08/18/22 0549 08/18/22 1058  BP:   127/84 127/65  Pulse:   99 84  Resp: '18 18 16 20  '$ Temp:   98 F (36.7 C) 98.7 F (37.1 C)  TempSrc:   Oral Oral  SpO2: 100% 100% 100%   Weight:      Height:        General appearance : Awake, alert, not in any distress. Speech Clear. Not toxic looking HEENT: Atraumatic and Normocephalic, pupils equally reactive to light and accomodation Neck: Supple, no JVD. No cervical lymphadenopathy.  Chest: Good air entry bilaterally, no added sounds  CVS: S1 S2 regular, no murmurs.  Abdomen: Bowel sounds present, Non tender and not distended with no gaurding, rigidity or rebound. Extremities: B/L Lower Ext shows no edema, both legs are warm to touch Neurology: Awake alert, and oriented X 3, CN II-XII intact, Non focal Skin: No Rash  Discharge Instructions   Allergies as of 08/18/2022       Reactions   Ketamine Hives, Nausea And Vomiting, Other (See Comments)   Makes the Carmen "feel funny" also        Medication List     TAKE these medications    betamethasone valerate ointment 0.1 % Commonly known as: VALISONE APPLY TO AFFECTED AREA TWICE A DAY   cyclobenzaprine 10 MG tablet Commonly known as: FLEXERIL TAKE 1 TABLET BY MOUTH DAILY AS NEEDED FOR MUSCLE SPASMS.   DULoxetine 30 MG capsule Commonly known as: CYMBALTA Take 2 capsules (60 mg total) by mouth  daily.   folic acid 1 MG tablet Commonly known as: FOLVITE Take 1 tablet (1 mg total) by mouth daily.   morphine 60 MG 12 hr tablet Commonly known as: MS CONTIN Take 1 tablet (60 mg total) by mouth every 12 (twelve) hours.   Oxycodone HCl 10 MG Tabs Take 1 tablet (10 mg total) by mouth every 6 (six) hours as needed (pain). What changed: when to take this   Vitamin D (Ergocalciferol) 1.25 MG (50000 UNIT) Caps capsule Commonly known as: DRISDOL TAKE ONE CAPSULE BY MOUTH ONE TIME PER WEEK        The results of significant diagnostics from this hospitalization (including imaging, microbiology, ancillary and laboratory) are listed below for reference.    Significant Diagnostic Studies: DG Chest Portable 1 View  Result Date: 08/13/2022 CLINICAL DATA:  Sickle cell with pain in upper  back and shoulders. EXAM: PORTABLE CHEST 1 VIEW COMPARISON:  Chest radiograph 05/15/2022 and 03/01/2022 FINDINGS: Small densities at the right lung base could represent atelectasis and chronic changes. Otherwise, both lungs are clear. Heart and mediastinum are within normal limits. Trachea is midline. Negative for a pneumothorax. No acute bone abnormality. IMPRESSION: No acute findings. Electronically Signed   By: Markus Daft M.D.   On: 08/13/2022 07:58    Microbiology: No results found for this or any previous visit (from the past 240 hour(s)).   Labs: Basic Metabolic Panel: Recent Labs  Lab 08/13/22 0815 08/15/22 0556 08/17/22 1207  NA 139 138 139  K 3.2* 4.6 4.1  CL 108 107 105  CO2 '25 23 27  '$ GLUCOSE 104* 93 104*  BUN '11 11 12  '$ CREATININE 0.80 0.57 0.59  CALCIUM 9.6 9.3 9.9   Liver Function Tests: Recent Labs  Lab 08/13/22 0815  AST 26  ALT 22  ALKPHOS 63  BILITOT 1.3*  PROT 8.3*  ALBUMIN 4.3   No results for input(s): "LIPASE", "AMYLASE" in the last 168 hours. No results for input(s): "AMMONIA" in the last 168 hours. CBC: Recent Labs  Lab 08/13/22 0815 08/15/22 0556  08/17/22 1207  WBC 9.3 9.8 11.6*  NEUTROABS 4.9 4.9  --   HGB 10.0* 9.6* 10.2*  HCT 28.2* 27.1* 28.7*  MCV 82.9 83.9 83.2  PLT 571* 468* 464*   Cardiac Enzymes: No results for input(s): "CKTOTAL", "CKMB", "CKMBINDEX", "TROPONINI" in the last 168 hours. BNP: Invalid input(s): "POCBNP" CBG: No results for input(s): "GLUCAP" in the last 168 hours.  Time coordinating discharge: 50 minutes  Signed: [name]  APRN, MSN, FNP-C Carmen [name] [address] [place], [place] [address] [phone]  [facility] 08/18/2022, 12:15 PM

## 2022-08-18 NOTE — Progress Notes (Signed)
Discharge instructions, RX's and follow up appts explained and provided to patient. Patient verbalized understanding patient left floor via walking refused wheelchair.  Scotlynn Noyes, Tivis Ringer, RN

## 2022-08-24 ENCOUNTER — Ambulatory Visit (INDEPENDENT_AMBULATORY_CARE_PROVIDER_SITE_OTHER): Payer: Medicaid Other | Admitting: Family Medicine

## 2022-08-24 ENCOUNTER — Encounter: Payer: Self-pay | Admitting: Family Medicine

## 2022-08-24 VITALS — BP 111/78 | HR 98 | Temp 98.9°F | Ht 63.0 in | Wt 191.8 lb

## 2022-08-24 DIAGNOSIS — M545 Low back pain, unspecified: Secondary | ICD-10-CM | POA: Diagnosis not present

## 2022-08-24 DIAGNOSIS — Z30019 Encounter for initial prescription of contraceptives, unspecified: Secondary | ICD-10-CM | POA: Diagnosis not present

## 2022-08-24 DIAGNOSIS — G894 Chronic pain syndrome: Secondary | ICD-10-CM

## 2022-08-24 DIAGNOSIS — D571 Sickle-cell disease without crisis: Secondary | ICD-10-CM | POA: Diagnosis not present

## 2022-08-24 DIAGNOSIS — E559 Vitamin D deficiency, unspecified: Secondary | ICD-10-CM | POA: Diagnosis not present

## 2022-08-24 DIAGNOSIS — G8929 Other chronic pain: Secondary | ICD-10-CM

## 2022-08-24 LAB — POCT URINE PREGNANCY: Preg Test, Ur: NEGATIVE

## 2022-08-24 MED ORDER — DULOXETINE HCL 30 MG PO CPEP: 60.0000 mg | ORAL_CAPSULE | Freq: Every day | ORAL | 1 refills | Status: DC

## 2022-08-24 MED ORDER — CYCLOBENZAPRINE HCL 10 MG PO TABS: ORAL_TABLET | ORAL | 1 refills | Status: DC

## 2022-08-24 MED ORDER — MEDROXYPROGESTERONE ACETATE 150 MG/ML IM SUSP
150.0000 mg | Freq: Once | INTRAMUSCULAR | Status: AC
  Administered 2022-08-24: 150 mg via INTRAMUSCULAR

## 2022-08-24 MED ORDER — VITAMIN D (ERGOCALCIFEROL) 1.25 MG (50000 UNIT) PO CAPS: ORAL_CAPSULE | ORAL | 1 refills | Status: DC

## 2022-08-24 NOTE — Progress Notes (Signed)
Established Patient Office Visit  Subjective   Patient ID: Carmen [name], female    DOB: 11/03/1990  Age: 31 y.o. MRN: [id number]  Chief Complaint  Patient presents with  . Follow-up    Depo     HPI  Patient Active Problem List   Diagnosis Date Noted  . Nausea & vomiting 03/03/2022  . Acute chest syndrome due to sickle cell crisis ([place]) 09/05/2021  . Chronic pain syndrome 08/21/2021  . Moderate episode of recurrent major depressive disorder ([place]) 05/28/2021  . Major depressive disorder 05/22/2021  . Tobacco abuse 05/21/2021  . Sickle cell disease with crisis ([place]) 03/23/2021  . Acute pain of right shoulder   . Sickle cell anemia with crisis ([place]) 02/16/2021  . Community acquired pneumonia of right lower lobe of lung   . Fever of unknown origin 10/14/2020  . Sickle cell crisis ([place]) 03/31/2020  . Bacterial vaginitis 01/04/2020  . Acute cystitis without hematuria   . Dysuria 12/31/2019  . Urine leukocytes   . Sickle cell pain crisis ([place]) 09/10/2019  . Sickle cell disease ([place]) 12/04/2018  . Alpha thalassemia silent carrier 12/04/2018  . Chronic prescription opiate use 03/13/2018  . Chronic musculoskeletal pain 03/13/2018  . Vitamin D deficiency 01/13/2018  . Leukocytosis 08/21/2017  . Hypokalemia 06/27/2017  . 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 Constipation due to pain medication   . Hb-SS disease without crisis ([place]) 08/15/2016  . Anemia of chronic disease   . Chest pain 11/10/2015  . Thrombocytosis 11/22/2014  . Systolic ejection murmur 62/70/3500  . Sickle cell anemia of mother during pregnancy [facility]) 05/16/2014   Past Medical History:  Diagnosis Date  . Anxiety   . Headache(784.0)   . Heart murmur   . Sickle cell crisis ([place])   . Syphilis 2015   Was diagnosed and received one injection of antibiotics  . Thrombocytosis 11/22/2014     CBC Latest Ref Rng & Units 12/13/2018 12/11/2018 12/10/2018 WBC 4.0 - 10.5 K/uL 14.8(H) 13.2(H) 15.9(H)  Hemoglobin 12.0 - 15.0 g/dL 8.2(L) 7.7(L) 8.1(L) Hematocrit 36.0 - 46.0 % 23.7(L) 23.2(L) 24.5(L) Platelets 150 - 400 K/uL 326 399 388     Past Surgical History:  Procedure Laterality Date  . CESAREAN SECTION N/A 02/05/2019   Procedure: CESAREAN SECTION;  Surgeon: [name], MD;  Location: [facility] LD ORS;  Service: Obstetrics;  Laterality: N/A;  . CHOLECYSTECTOMY N/A 11/30/2014   Procedure: LAPAROSCOPIC CHOLECYSTECTOMY SINGLE SITE WITH INTRAOPERATIVE CHOLANGIOGRAM;  Surgeon: Michael Boston, MD;  Location: [facility] ORS;  Service: General;  Laterality: N/A;  . SPLENECTOMY     Social History   Tobacco Use  . Smoking status: Some Days    Types: Cigarettes  . Smokeless tobacco: Never  Vaping Use  . Vaping Use: Never used  Substance Use Topics  . Alcohol use: No  . Drug use: No   Social History   Socioeconomic History  . Marital status: Single    Spouse name: Not on file  . Number of children: Not on file  . Years of education: Not on file  . Highest education level: Not on file  Occupational History  . Not on file  Tobacco Use  . Smoking status: Some Days    Types: Cigarettes  . Smokeless tobacco: Never  Vaping Use  . Vaping Use: Never used  Substance and Sexual Activity  . Alcohol use: No  . Drug use: No  . Sexual activity: Yes    Birth  control/protection: Injection  Other Topics Concern  . Not on file  Social History Narrative  . Not on file   Social Determinants of Health   Financial Resource Strain: Not on file  Food Insecurity: Food Insecurity Present (08/13/2022)   Hunger Vital Sign   . Worried About Charity fundraiser in the Last Year: Sometimes true   . Ran Out of Food in the Last Year: Never true  Transportation Needs: Unmet Transportation Needs (08/13/2022)   PRAPARE - Transportation   . Lack of Transportation (Medical): Yes   . Lack of Transportation (Non-Medical): Yes  Physical Activity: Not on file  Stress: Not on file  Social Connections: Not on file   Intimate Partner Violence: Not At Risk (08/13/2022)   Humiliation, Afraid, Rape, and Kick questionnaire   . Fear of Current or Ex-Partner: No   . Emotionally Abused: No   . Physically Abused: No   . Sexually Abused: No   Family Status  Relation Name Status  . Mother  Alive  . Father  Alive  . Sister  (Not Specified)  . Sister  (Not Specified)  . Sister  (Not Specified)  . Mat Aunt  (Not Specified)  . Mat Uncle  (Not Specified)  . Sister  (Not Specified)   Family History  Problem Relation Age of Onset  . Hypertension Mother   . Sickle cell anemia Sister   . Kidney disease Sister        Lupus  . Arthritis Sister   . Sickle cell anemia Sister   . Sickle cell trait Sister   . Heart disease Maternal Aunt        CABG  . Heart disease Maternal Uncle        CABG  . Lupus Sister    Allergies  Allergen Reactions  . Ketamine Hives, Nausea And Vomiting and Other (See Comments)    Makes the patient "feel funny" also      Review of Systems  Constitutional: Negative.   HENT: Negative.    Eyes: Negative.   Cardiovascular: Negative.   Gastrointestinal: Negative.   Genitourinary: Negative.   Musculoskeletal:  Positive for back pain and joint pain.  Skin: Negative.   Neurological: Negative.   Endo/Heme/Allergies: Negative.   Psychiatric/Behavioral: Negative.        Objective:     BP 111/78   Pulse 98   Temp 98.9 F (37.2 C)   Ht '5\' 3"'$  (1.6 m)   Wt 191 lb 12.8 oz (87 kg)   LMP 08/10/2022   SpO2 100%   BMI 33.98 kg/m  BP Readings from Last 3 Encounters:  08/24/22 111/78  08/18/22 127/65  07/19/22 109/65   Wt Readings from Last 3 Encounters:  08/24/22 191 lb 12.8 oz (87 kg)  08/13/22 194 lb 8.9 oz (88.3 kg)  07/14/22 185 lb (83.9 kg)      Physical Exam Constitutional:      Appearance: Normal appearance.  Eyes:     Pupils: Pupils are equal, round, and reactive to light.  Cardiovascular:     Rate and Rhythm: Normal rate and regular rhythm.     Pulses:  Normal pulses.  Pulmonary:     Effort: Pulmonary effort is normal.  Abdominal:     General: Bowel sounds are normal.  Musculoskeletal:        General: Normal range of motion.  Skin:    General: Skin is warm.  Neurological:     General: No focal deficit present.  Mental Status: She is alert. Mental status is at baseline.  Psychiatric:        Mood and Affect: Mood normal.        Behavior: Behavior normal.        Thought Content: Thought content normal.        Judgment: Judgment normal.     No results found for any visits on 08/24/22.  Last CBC Lab Results  Component Value Date   WBC 11.6 (H) 08/17/2022   HGB 10.2 (L) 08/17/2022   HCT 28.7 (L) 08/17/2022   MCV 83.2 08/17/2022   MCH 29.6 08/17/2022   RDW 13.9 08/17/2022   PLT 464 (H) 79/89/2119   Last metabolic panel Lab Results  Component Value Date   GLUCOSE 104 (H) 08/17/2022   NA 139 08/17/2022   K 4.1 08/17/2022   CL 105 08/17/2022   CO2 27 08/17/2022   BUN 12 08/17/2022   CREATININE 0.59 08/17/2022   GFRNONAA >60 08/17/2022   CALCIUM 9.9 08/17/2022   PHOS 3.6 05/22/2021   PROT 8.3 (H) 08/13/2022   ALBUMIN 4.3 08/13/2022   LABGLOB 3.1 07/02/2020   AGRATIO 1.4 07/02/2020   BILITOT 1.3 (H) 08/13/2022   ALKPHOS 63 08/13/2022   AST 26 08/13/2022   ALT 22 08/13/2022   ANIONGAP 7 08/17/2022   Last lipids No results found for: "CHOL", "HDL", "LDLCALC", "LDLDIRECT", "TRIG", "CHOLHDL" Last hemoglobin A1c No results found for: "HGBA1C" Last thyroid functions No results found for: "TSH", "T3TOTAL", "T4TOTAL", "THYROIDAB" Last vitamin D Lab Results  Component Value Date   VD25OH 19.3 (L) 07/02/2020   Last vitamin B12 and Folate Lab Results  Component Value Date   VITAMINB12 620 09/15/2018      The ASCVD Risk score (Arnett DK, et al., 2019) failed to calculate for the following reasons:   The 2019 ASCVD risk score is only valid for ages 105 to 15    Assessment & Plan:   Problem List Items  Addressed This Visit       Other   Hb-SS disease without crisis ([place]) - Primary (Chronic)   Relevant Orders   [id number] 11+Oxyco+Alc+Crt-Bund   Sickle Cell Panel   Vitamin D deficiency   Relevant Orders   Sickle Cell Panel   Chronic pain syndrome   Relevant Orders   [id number] 11+Oxyco+Alc+Crt-Bund    No follow-ups on file.    [name], FNP

## 2022-08-24 NOTE — Patient Instructions (Signed)
Ketogenic Eating Plan, Adult A ketogenic eating plan is a diet that is very low in carbohydrates, moderately low in protein, and very high in fat. Your body normally gets energy from eating carbohydrates. If you limit carbohydrates in your diet, your body will start to use stored fat as an energy source. When fat is broken down for energy, it enters your blood as a substance called ketones. A ketogenic eating plan relies mostly on ketones for energy. This eating plan can cause rapid weight loss because the body burns fat for fuel. What are the benefits of a ketogenic eating plan? Epilepsy Ketogenic eating plans have been well studied as a treatment for epilepsy in children and have been used for many years. These plans have been studied to treat epilepsy in adults too. You will want to work closely with a dietitian if your health care provider suggests this eating plan to manage your seizures. Other conditions Ketogenic eating plans have also been studied to help treat other conditions, including: Cancer. Type 2 diabetes. Alzheimer's disease. Parkinson's disease. Autism. Multiple sclerosis. More long-term studies are needed to learn the effects of this eating plan on people with these and other conditions. What are the side effects of a ketogenic eating plan? Side effects of a ketogenic diet may include: Vitamin deficiencies. Dehydration. Bad breath. Nausea. Hunger. Constipation. Problems with menstrual periods. Inflammation of the pancreas. Kidney stones or gallbladder stones. Bone fractures. High cholesterol. What are tips for following this plan? Reading food labels Look for foods that have a low glycemic index (GI) label. Read a product's ingredient list to check for hidden or added sugar. Check food labels for the number of grams of carbohydrates and protein in each serving. This is important. Cooking Carefully measure or weigh foods. Make desserts using ketogenic or low GI  recipes. Avoid cooking with sauces that have added sugar, such as barbecue sauce or ketchup. Meal planning There are several versions of ketogenic eating plans. The classic ketogenic diet recommends that 90% of your calories come from fat, 6% from protein, and 4% from carbohydrates. Aim for a daily meal and snack schedule that you can follow steadily. Every meal will include high-fat items, such as avocado, cream, butter, or mayonnaise. Each day, you should have: Fat: __________g. Protein: _________g. Carbohydrates: _________g. General information  Buy a gram scale to weigh foods. This is needed to follow this diet correctly. Your dietitian will teach you how to use it. Ask your health care provider what steps you can take to avoid side effects of this eating plan, such as constipation, dehydration, and kidney stones. Take vitamin and mineral supplements only as told by your health care provider or dietitian. Work with your dietitian and health care provider to help you stay on track. What foods should I eat?  Fruits Fresh blueberries, blackberries, or raspberries. Other fresh and frozen fruits in small amounts. Vegetables Lettuce. Bok choy. Eggplant. Tomatoes. Cucumbers. Peppers. Cauliflower. Zucchini. Fennel. Swiss chard. Grains Almond, hazelnut, or coconut flours. Meats and other proteins Meat, poultry, and fish. Bacon. Eggs. Egg substitutes. Nuts, nut butters (without added sugar), seeds, lentils, and split peas in small amounts. Dairy Cheese in moderate amounts. Beverages Plain or mineral water. Sugar-free, caffeine-free beverages. Club soda. Caffeine-free, carbohydrate-free herbal tea. Fats and oils Avocado. Cream. Sour cream. Cream cheese. Butter. Plant-based oils, such as olive, canola, and sunflower. Margarine. Mayonnaise. Sweets and desserts Any homemade sweets or desserts made using ketogenic diet recipes. The items listed above may not be a  complete list of recommended  foods and beverages. Contact a dietitian for more information. What foods should I avoid? Fruits Fruit juice. Fruits packed in syrups. Dried or candied fruits. Vegetables Corn. Potatoes (all kinds). Peas. Grains All bread, dry cereals, and cooked cereals with added sugar. Baked goods. Crackers and pretzels. Meats and other proteins Meat, poultry, or fish prepared with flour or breading. Nut butters with added sugar. Beans. Dairy Milk. Yogurt. Fats and oils Salad dressings with added sugar. Gravies. Beverages Sugar-sweetened teas, coffee drinks, or soft drinks. Juice. Sports drinks. Sweets and desserts All sweets and desserts, unless the dessert is homemade using ketogenic diet recipes. The items listed above may not be a complete list of foods and beverages to avoid. Contact a dietitian for more information. Summary A ketogenic eating plan is a diet that is very low in carbohydrates, moderately low in protein, and very high in fat. Aim for a daily meal and snack schedule that you can follow steadily. Buy a gram scale to weigh foods. This is needed to follow this diet correctly. Your dietitian will teach you how to use it. Work closely with your health care provider and a dietitian while you are following a ketogenic eating plan. This information is not intended to replace advice given to you by your health care provider. Make sure you discuss any questions you have with your health care provider. Document Revised: 02/28/2020 Document Reviewed: 02/28/2020 Elsevier Patient Education  [place].

## 2022-08-25 ENCOUNTER — Telehealth: Payer: Self-pay | Admitting: Nurse Practitioner

## 2022-08-26 LAB — CMP14+CBC/D/PLT+FER+RETIC+V...
ALT: 42 IU/L — ABNORMAL HIGH (ref 0–32)
AST: 34 IU/L (ref 0–40)
Albumin/Globulin Ratio: 1.2 (ref 1.2–2.2)
Albumin: 4.6 g/dL (ref 4.0–5.0)
Alkaline Phosphatase: 96 IU/L (ref 44–121)
BUN/Creatinine Ratio: 16 (ref 9–23)
BUN: 10 mg/dL (ref 6–20)
Basophils Absolute: 0.1 10*3/uL (ref 0.0–0.2)
Basos: 1 %
Bilirubin Total: 0.8 mg/dL (ref 0.0–1.2)
CO2: 17 mmol/L — ABNORMAL LOW (ref 20–29)
Calcium: 10.4 mg/dL — ABNORMAL HIGH (ref 8.7–10.2)
Chloride: 105 mmol/L (ref 96–106)
Creatinine, Ser: 0.64 mg/dL (ref 0.57–1.00)
EOS (ABSOLUTE): 0.2 10*3/uL (ref 0.0–0.4)
Eos: 3 %
Ferritin: 705 ng/mL — ABNORMAL HIGH (ref 15–150)
Globulin, Total: 3.9 g/dL (ref 1.5–4.5)
Glucose: 95 mg/dL (ref 70–99)
Hematocrit: 30.1 % — ABNORMAL LOW (ref 34.0–46.6)
Hemoglobin: 9.7 g/dL — ABNORMAL LOW (ref 11.1–15.9)
Immature Grans (Abs): 0 10*3/uL (ref 0.0–0.1)
Immature Granulocytes: 0 %
Lymphocytes Absolute: 2.8 10*3/uL (ref 0.7–3.1)
Lymphs: 40 %
MCH: 28.4 pg (ref 26.6–33.0)
MCHC: 32.2 g/dL (ref 31.5–35.7)
MCV: 88 fL (ref 79–97)
Monocytes Absolute: 0.7 10*3/uL (ref 0.1–0.9)
Monocytes: 9 %
Neutrophils Absolute: 3.3 10*3/uL (ref 1.4–7.0)
Neutrophils: 47 %
Platelets: 477 10*3/uL — ABNORMAL HIGH (ref 150–450)
Potassium: 4.8 mmol/L (ref 3.5–5.2)
RBC: 3.41 x10E6/uL — ABNORMAL LOW (ref 3.77–5.28)
RDW: 15.2 % (ref 11.7–15.4)
Retic Ct Pct: 3.5 % — ABNORMAL HIGH (ref 0.6–2.6)
Sodium: 142 mmol/L (ref 134–144)
Total Protein: 8.5 g/dL (ref 6.0–8.5)
Vit D, 25-Hydroxy: 23.1 ng/mL — ABNORMAL LOW (ref 30.0–100.0)
WBC: 7.1 10*3/uL (ref 3.4–10.8)
eGFR: 123 mL/min/{1.73_m2} (ref >59)

## 2022-08-26 NOTE — Progress Notes (Signed)
Carmen [name] is a very pleasant 31 year old female with a medical history significant for sickle cell disease, vitamin D deficiency, chronic pain syndrome, opiate dependence and tolerance, depression, and anemia of chronic disease that presented for follow-up of her chronic conditions and for Depo-Provera. Please inform Serene that most laboratory values are consistent with her baseline.  Her vitamin D continues to be below normal range.  We will continue vitamin D supplementation.  Please remind patient to follow-up in 3 months, she will also receive her next Depo-Provera injection during that appointment. Remind patient of the importance of hydrating with 32-64 ounces of water per day for sickle cell maintenance.  Also, folic acid 1 mg daily.  [name]  APRN, MSN, FNP-C [facility] [address] [place], [place] [address] [phone]

## 2022-08-27 NOTE — Telephone Encounter (Signed)
error 

## 2022-08-29 LAB — OPIATES CONFIRMATION, URINE
Codeine: NEGATIVE
Hydrocodone: NEGATIVE
Hydromorphone Confirm: 637 ng/mL
Hydromorphone: POSITIVE — AB
Morphine Confirm: >3000 ng/mL
Morphine: POSITIVE — AB
Opiates: POSITIVE ng/mL — AB

## 2022-08-29 LAB — DRUG SCREEN 764883 11+OXYCO+ALC+CRT-BUND
Amphetamines, Urine: NEGATIVE ng/mL
BENZODIAZ UR QL: NEGATIVE ng/mL
Barbiturate: NEGATIVE ng/mL
Cocaine (Metabolite): NEGATIVE ng/mL
Creatinine: 102.7 mg/dL (ref 20.0–300.0)
Ethanol: NEGATIVE %
Meperidine: NEGATIVE ng/mL
Methadone Screen, Urine: NEGATIVE ng/mL
Phencyclidine: NEGATIVE ng/mL
Propoxyphene: NEGATIVE ng/mL
Tramadol: NEGATIVE ng/mL
pH, Urine: 6.3 (ref 4.5–8.9)

## 2022-08-29 LAB — CANNABINOID CONFIRMATION, UR
CANNABINOIDS: POSITIVE — AB
Carboxy THC GC/MS Conf: 350 ng/mL

## 2022-08-29 LAB — OXYCODONE/OXYMORPHONE, CONFIRM
OXYCODONE/OXYMORPH: POSITIVE — AB
OXYCODONE: 1076 ng/mL
OXYCODONE: POSITIVE — AB
OXYMORPHONE (GC/MS): 1773 ng/mL
OXYMORPHONE: POSITIVE — AB

## 2022-08-31 ENCOUNTER — Other Ambulatory Visit: Payer: Self-pay | Admitting: Family Medicine

## 2022-08-31 DIAGNOSIS — D571 Sickle-cell disease without crisis: Secondary | ICD-10-CM

## 2022-08-31 DIAGNOSIS — Z79891 Long term (current) use of opiate analgesic: Secondary | ICD-10-CM

## 2022-08-31 MED ORDER — OXYCODONE HCL 10 MG PO TABS: 10.0000 mg | ORAL_TABLET | Freq: Four times a day (QID) | ORAL | 0 refills | Status: DC | PRN

## 2022-08-31 NOTE — Telephone Encounter (Signed)
Reviewed PDMP substance reporting system prior to prescribing opiate medications. No inconsistencies noted.  Meds ordered this encounter  Medications   Oxycodone HCl 10 MG TABS    Sig: Take 1 tablet (10 mg total) by mouth every 6 (six) hours as needed (pain).    Dispense:  60 tablet    Refill:  0    Order Specific Question:   Supervising Provider    Answer:   [name], [name] [[id number]]   Carmen Tineo Moore Maliyah Willets  APRN, MSN, FNP-C [facility] [address]  [place], [place] [address] [phone]  

## 2022-09-11 ENCOUNTER — Inpatient Hospital Stay (HOSPITAL_COMMUNITY)
Admission: EM | Admit: 2022-09-11 | Discharge: 2022-09-17 | DRG: 812 | Disposition: A | Discharge status: Home / Self Care | Payer: Medicaid Other | Attending: Internal Medicine | Admitting: Internal Medicine

## 2022-09-11 ENCOUNTER — Encounter (HOSPITAL_COMMUNITY): Payer: Self-pay

## 2022-09-11 DIAGNOSIS — Z79899 Other long term (current) drug therapy: Secondary | ICD-10-CM | POA: Diagnosis not present

## 2022-09-11 DIAGNOSIS — F1120 Opioid dependence, uncomplicated: Secondary | ICD-10-CM | POA: Diagnosis present

## 2022-09-11 DIAGNOSIS — Z832 Family history of diseases of the blood and blood-forming organs and certain disorders involving the immune mechanism: Secondary | ICD-10-CM

## 2022-09-11 DIAGNOSIS — F17210 Nicotine dependence, cigarettes, uncomplicated: Secondary | ICD-10-CM | POA: Diagnosis present

## 2022-09-11 DIAGNOSIS — D638 Anemia in other chronic diseases classified elsewhere: Secondary | ICD-10-CM | POA: Diagnosis present

## 2022-09-11 DIAGNOSIS — G894 Chronic pain syndrome: Secondary | ICD-10-CM | POA: Diagnosis present

## 2022-09-11 DIAGNOSIS — E559 Vitamin D deficiency, unspecified: Secondary | ICD-10-CM | POA: Diagnosis present

## 2022-09-11 DIAGNOSIS — F329 Major depressive disorder, single episode, unspecified: Secondary | ICD-10-CM | POA: Diagnosis present

## 2022-09-11 DIAGNOSIS — D571 Sickle-cell disease without crisis: Secondary | ICD-10-CM

## 2022-09-11 DIAGNOSIS — D5700 Hb-SS disease with crisis, unspecified: Secondary | ICD-10-CM | POA: Diagnosis not present

## 2022-09-11 DIAGNOSIS — Z888 Allergy status to other drugs, medicaments and biological substances status: Secondary | ICD-10-CM | POA: Diagnosis not present

## 2022-09-11 DIAGNOSIS — Z79891 Long term (current) use of opiate analgesic: Secondary | ICD-10-CM

## 2022-09-11 LAB — CBC WITH DIFFERENTIAL/PLATELET
Abs Immature Granulocytes: 0.03 10*3/uL (ref 0.00–0.07)
Basophils Absolute: 0 10*3/uL (ref 0.0–0.1)
Basophils Relative: 0 %
Eosinophils Absolute: 0.1 10*3/uL (ref 0.0–0.5)
Eosinophils Relative: 1 %
HCT: 31 % — ABNORMAL LOW (ref 36.0–46.0)
Hemoglobin: 11 g/dL — ABNORMAL LOW (ref 12.0–15.0)
Immature Granulocytes: 0 %
Lymphocytes Relative: 40 %
Lymphs Abs: 4.2 10*3/uL — ABNORMAL HIGH (ref 0.7–4.0)
MCH: 29.4 pg (ref 26.0–34.0)
MCHC: 35.5 g/dL (ref 30.0–36.0)
MCV: 82.9 fL (ref 80.0–100.0)
Monocytes Absolute: 0.9 10*3/uL (ref 0.1–1.0)
Monocytes Relative: 9 %
Neutro Abs: 5.3 10*3/uL (ref 1.7–7.7)
Neutrophils Relative %: 50 %
Platelets: 509 10*3/uL — ABNORMAL HIGH (ref 150–400)
RBC: 3.74 MIL/uL — ABNORMAL LOW (ref 3.87–5.11)
RDW: 13.1 % (ref 11.5–15.5)
WBC: 10.6 10*3/uL — ABNORMAL HIGH (ref 4.0–10.5)
nRBC: 0 % (ref 0.0–0.2)

## 2022-09-11 LAB — COMPREHENSIVE METABOLIC PANEL
ALT: 25 U/L (ref 0–44)
AST: 24 U/L (ref 15–41)
Albumin: 4.3 g/dL (ref 3.5–5.0)
Alkaline Phosphatase: 66 U/L (ref 38–126)
Anion gap: 6 (ref 5–15)
BUN: 12 mg/dL (ref 6–20)
CO2: 24 mmol/L (ref 22–32)
Calcium: 10 mg/dL (ref 8.9–10.3)
Chloride: 107 mmol/L (ref 98–111)
Creatinine, Ser: 0.58 mg/dL (ref 0.44–1.00)
GFR, Estimated: >60 mL/min (ref >60)
Glucose, Bld: 84 mg/dL (ref 70–99)
Potassium: 3.6 mmol/L (ref 3.5–5.1)
Sodium: 137 mmol/L (ref 135–145)
Total Bilirubin: 1.3 mg/dL — ABNORMAL HIGH (ref 0.3–1.2)
Total Protein: 8.9 g/dL — ABNORMAL HIGH (ref 6.5–8.1)

## 2022-09-11 LAB — CBC
HCT: 29.8 % — ABNORMAL LOW (ref 36.0–46.0)
Hemoglobin: 10.5 g/dL — ABNORMAL LOW (ref 12.0–15.0)
MCH: 29.2 pg (ref 26.0–34.0)
MCHC: 35.2 g/dL (ref 30.0–36.0)
MCV: 83 fL (ref 80.0–100.0)
Platelets: 439 10*3/uL — ABNORMAL HIGH (ref 150–400)
RBC: 3.59 MIL/uL — ABNORMAL LOW (ref 3.87–5.11)
RDW: 12.9 % (ref 11.5–15.5)
WBC: 10.4 10*3/uL (ref 4.0–10.5)
nRBC: 0 % (ref 0.0–0.2)

## 2022-09-11 LAB — URINALYSIS, ROUTINE W REFLEX MICROSCOPIC
Bilirubin Urine: NEGATIVE
Glucose, UA: NEGATIVE mg/dL
Hgb urine dipstick: NEGATIVE
Ketones, ur: NEGATIVE mg/dL
Leukocytes,Ua: NEGATIVE
Nitrite: NEGATIVE
Protein, ur: NEGATIVE mg/dL
Specific Gravity, Urine: 1.013 (ref 1.005–1.030)
pH: 5 (ref 5.0–8.0)

## 2022-09-11 LAB — CREATININE, SERUM
Creatinine, Ser: 0.54 mg/dL (ref 0.44–1.00)
GFR, Estimated: >60 mL/min (ref >60)

## 2022-09-11 LAB — RETICULOCYTES
Immature Retic Fract: 20.9 % — ABNORMAL HIGH (ref 2.3–15.9)
RBC.: 3.67 MIL/uL — ABNORMAL LOW (ref 3.87–5.11)
Retic Count, Absolute: 89.5 10*3/uL (ref 19.0–186.0)
Retic Ct Pct: 2.4 % (ref 0.4–3.1)

## 2022-09-11 LAB — LACTATE DEHYDROGENASE: LDH: 177 U/L (ref 98–192)

## 2022-09-11 MED ORDER — ONDANSETRON HCL 4 MG/2ML IJ SOLN
4.0000 mg | Freq: Four times a day (QID) | INTRAMUSCULAR | Status: DC | PRN
  Administered 2022-09-12: 4 mg via INTRAVENOUS
  Filled 2022-09-11: qty 2

## 2022-09-11 MED ORDER — HYDROMORPHONE HCL 2 MG/ML IJ SOLN
2.0000 mg | INTRAMUSCULAR | Status: DC
  Filled 2022-09-11: qty 1

## 2022-09-11 MED ORDER — FOLIC ACID 1 MG PO TABS
1.0000 mg | ORAL_TABLET | Freq: Every day | ORAL | Status: DC
  Administered 2022-09-11 – 2022-09-17 (×7): 1 mg via ORAL
  Filled 2022-09-11 (×7): qty 1

## 2022-09-11 MED ORDER — PANTOPRAZOLE SODIUM 40 MG PO TBEC
40.0000 mg | DELAYED_RELEASE_TABLET | Freq: Every day | ORAL | Status: DC
  Administered 2022-09-12 – 2022-09-17 (×6): 40 mg via ORAL
  Filled 2022-09-11 (×6): qty 1

## 2022-09-11 MED ORDER — KETOROLAC TROMETHAMINE 15 MG/ML IJ SOLN
15.0000 mg | Freq: Four times a day (QID) | INTRAMUSCULAR | Status: AC
  Administered 2022-09-12 – 2022-09-16 (×20): 15 mg via INTRAVENOUS
  Filled 2022-09-11 (×19): qty 1

## 2022-09-11 MED ORDER — PROMETHAZINE HCL 25 MG PO TABS: 12.5000 mg | ORAL_TABLET | ORAL | Status: DC | PRN

## 2022-09-11 MED ORDER — HYDROMORPHONE HCL 2 MG/ML IJ SOLN
2.0000 mg | INTRAMUSCULAR | Status: AC
  Administered 2022-09-11: 2 mg via INTRAVENOUS

## 2022-09-11 MED ORDER — NALOXONE HCL 0.4 MG/ML IJ SOLN: 0.4000 mg | INTRAMUSCULAR | Status: DC | PRN

## 2022-09-11 MED ORDER — SODIUM CHLORIDE 0.9% FLUSH: 9.0000 mL | INTRAVENOUS | Status: DC | PRN

## 2022-09-11 MED ORDER — PROMETHAZINE HCL 12.5 MG RE SUPP: 12.5000 mg | RECTAL | Status: DC | PRN

## 2022-09-11 MED ORDER — HYDROMORPHONE HCL 2 MG/ML IJ SOLN: 2.0000 mg | INTRAMUSCULAR | Status: DC

## 2022-09-11 MED ORDER — HYDROMORPHONE HCL 2 MG/ML IJ SOLN
2.0000 mg | INTRAMUSCULAR | Status: AC
  Administered 2022-09-11: 2 mg via INTRAVENOUS
  Filled 2022-09-11: qty 1

## 2022-09-11 MED ORDER — ONDANSETRON 4 MG PO TBDP: 8.0000 mg | ORAL_TABLET | Freq: Three times a day (TID) | ORAL | Status: DC | PRN

## 2022-09-11 MED ORDER — PANTOPRAZOLE SODIUM 40 MG IV SOLR
40.0000 mg | Freq: Every day | INTRAVENOUS | Status: DC
  Filled 2022-09-11: qty 10

## 2022-09-11 MED ORDER — HEPARIN SODIUM (PORCINE) 5000 UNIT/ML IJ SOLN
5000.0000 [IU] | Freq: Three times a day (TID) | INTRAMUSCULAR | Status: DC
  Filled 2022-09-11 (×4): qty 1

## 2022-09-11 MED ORDER — DEXTROSE-NACL 5-0.45 % IV SOLN: INTRAVENOUS | Status: DC

## 2022-09-11 MED ORDER — DIPHENHYDRAMINE HCL 25 MG PO CAPS: 25.0000 mg | ORAL_CAPSULE | ORAL | Status: DC | PRN

## 2022-09-11 MED ORDER — VITAMIN D (ERGOCALCIFEROL) 1.25 MG (50000 UNIT) PO CAPS
50000.0000 [IU] | ORAL_CAPSULE | ORAL | Status: DC
  Administered 2022-09-17: 50000 [IU] via ORAL
  Filled 2022-09-11: qty 1

## 2022-09-11 MED ORDER — HYDROMORPHONE 1 MG/ML IV SOLN
INTRAVENOUS | Status: AC
  Filled 2022-09-11: qty 30

## 2022-09-11 MED ORDER — SENNOSIDES-DOCUSATE SODIUM 8.6-50 MG PO TABS
1.0000 | ORAL_TABLET | Freq: Two times a day (BID) | ORAL | Status: DC
  Administered 2022-09-12 – 2022-09-17 (×11): 1 via ORAL
  Filled 2022-09-11 (×11): qty 1

## 2022-09-11 MED ORDER — POLYETHYLENE GLYCOL 3350 17 G PO PACK
17.0000 g | PACK | Freq: Every day | ORAL | Status: DC | PRN
  Administered 2022-09-16: 17 g via ORAL
  Filled 2022-09-11: qty 1

## 2022-09-11 MED ORDER — HYDROMORPHONE HCL 2 MG/ML IJ SOLN
2.0000 mg | Freq: Once | INTRAMUSCULAR | Status: AC
  Administered 2022-09-11: 2 mg via INTRAVENOUS
  Filled 2022-09-11: qty 1

## 2022-09-11 NOTE — ED Provider Notes (Signed)
[facility] DEPT Provider Note   CSN: [id number] Arrival date & time: 09/11/22  0949     History  Chief Complaint  Patient presents with   Sickle Cell Pain Crisis    Carmen [name] is a 31 y.o. female.  Patient presents to the emergency department complaining of pain in her back and bilateral knees thought to be secondary to a sickle cell crisis.  She states she has been taking her home meds over the past week with little relief.  She states this pain has been building since Monday of the past week.  She denies chest pain, shortness of breath, nausea, vomiting, abdominal pain, headache.  Past medical history significant for sickle cell crisis, anxiety, heart murmur  HPI     Home Medications Prior to Admission medications   Medication Sig Start Date End Date Taking? Authorizing Provider  betamethasone valerate ointment (VALISONE) 0.1 % APPLY TO AFFECTED AREA TWICE A DAY Patient not taking: Reported on 05/10/2022 04/07/22   [name], FNP  cyclobenzaprine (FLEXERIL) 10 MG tablet TAKE 1 TABLET BY MOUTH DAILY AS NEEDED FOR MUSCLE SPASMS. 08/24/22   [name], FNP  DULoxetine (CYMBALTA) 30 MG capsule Take 2 capsules (60 mg total) by mouth daily. 08/24/22 02/20/23  [name], FNP  folic acid (FOLVITE) 1 MG tablet Take 1 tablet (1 mg total) by mouth daily. 03/15/19   Lanae Boast, FNP  morphine (MS CONTIN) 60 MG 12 hr tablet Take 1 tablet (60 mg total) by mouth every 12 (twelve) hours. 08/18/22   [name], FNP  Oxycodone HCl 10 MG TABS Take 1 tablet (10 mg total) by mouth every 6 (six) hours as needed (pain). 08/31/22   [name], FNP  Vitamin D, Ergocalciferol, (DRISDOL) 1.25 MG (50000 UNIT) CAPS capsule TAKE ONE CAPSULE BY MOUTH ONE TIME PER WEEK 08/24/22   [name], FNP      Allergies    Ketamine    Review of Systems   Review of Systems  Respiratory:  Negative for shortness of breath.   Cardiovascular:   Negative for chest pain.  Gastrointestinal:  Negative for abdominal pain.  Musculoskeletal:  Positive for arthralgias and back pain.    Physical Exam Updated Vital Signs BP 132/61   Pulse 65   Temp 98.8 F (37.1 C)   Resp 17   Ht '5\' 3"'$  (1.6 m)   Wt 86 kg   LMP 08/10/2022   SpO2 100%   BMI 33.59 kg/m  Physical Exam Vitals and nursing note reviewed.  Constitutional:      General: She is not in acute distress.    Appearance: She is well-developed.  HENT:     Head: Normocephalic and atraumatic.     Mouth/Throat:     Mouth: Mucous membranes are moist.  Eyes:     Conjunctiva/sclera: Conjunctivae normal.  Cardiovascular:     Rate and Rhythm: Normal rate and regular rhythm.     Heart sounds: No murmur heard. Pulmonary:     Effort: Pulmonary effort is normal. No respiratory distress.     Breath sounds: Normal breath sounds.  Abdominal:     Palpations: Abdomen is soft.     Tenderness: There is no abdominal tenderness.  Musculoskeletal:        General: No swelling or tenderness. Normal range of motion.     Cervical back: Neck supple.  Skin:    General: Skin is warm and dry.  Capillary Refill: Capillary refill takes less than 2 seconds.  Neurological:     Mental Status: She is alert.  Psychiatric:        Mood and Affect: Mood normal.     ED Results / Procedures / Treatments   Labs (all labs ordered are listed, but only abnormal results are displayed) Labs Reviewed  CBC WITH DIFFERENTIAL/PLATELET - Abnormal; Notable for the following components:      Result Value   WBC 10.6 (*)    RBC 3.74 (*)    Hemoglobin 11.0 (*)    HCT 31.0 (*)    Platelets 509 (*)    Lymphs Abs 4.2 (*)    All other components within normal limits  COMPREHENSIVE METABOLIC PANEL - Abnormal; Notable for the following components:   Total Protein 8.9 (*)    Total Bilirubin 1.3 (*)    All other components within normal limits  RETICULOCYTES - Abnormal; Notable for the following components:    RBC. 3.67 (*)    Immature Retic Fract 20.9 (*)    All other components within normal limits  URINALYSIS, ROUTINE W REFLEX MICROSCOPIC  I-STAT BETA HCG BLOOD, ED ([facility], [facility], [facility] ONLY)    EKG None  Radiology No results found.  Procedures Procedures    Medications Ordered in ED Medications  diphenhydrAMINE (BENADRYL) capsule 25-50 mg (has no administration in time range)  ondansetron (ZOFRAN-ODT) disintegrating tablet 8 mg (has no administration in time range)  HYDROmorphone (DILAUDID) injection 2 mg (has no administration in time range)  HYDROmorphone (DILAUDID) injection 2 mg (2 mg Intravenous Given 09/11/22 1706)  HYDROmorphone (DILAUDID) injection 2 mg (2 mg Intravenous Given 09/11/22 1826)  HYDROmorphone (DILAUDID) injection 2 mg (2 mg Intravenous Given 09/11/22 2043)    ED Course/ Medical Decision Making/ A&P                           Medical Decision Making Risk Prescription drug management.   This patient presents to the ED for concern of back pain, this involves an extensive number of treatment options, and is a complaint that carries with it a high risk of complications and morbidity.  The differential diagnosis includes but is not limited to sickle cell crisis, fracture, dislocation, chronic pain syndrome, and others   Co morbidities that complicate the patient evaluation  Hx of sickle cell crisis   Additional history obtained:   External records from outside source obtained and reviewed including notes from admission for sickle cell crisis on 08/13/22   Lab Tests:  I Ordered, and personally interpreted labs.  The pertinent results include:  WBC 10.6, hemoglobin 11.0, grossly normal CMP, immature reticulocyte fraction 20.9, unremarkable UA   Consultations Obtained:  I requested consultation with the hospitalist, Dr. Marcello Moores and discussed lab and imaging findings as well as pertinent plan - they recommend: Admission for pain control   Problem List / ED  Course / Critical interventions / Medication management   I ordered medication including Dilaudid for pain, Zofran for nausea, Benadryl for possible pruritus Reevaluation of the patient after these medicines showed that the patient worsened I have reviewed the patients home medicines and have made adjustments as needed   Test / Admission - Considered:  The patient continues to be in pain after 3 doses of Dilaudid.  Hospitalist contacted for hospital admission for continued pain control        Final Clinical Impression(s) / ED Diagnoses Final diagnoses:  Sickle cell  pain crisis [facility])    Rx / DC Orders ED Discharge Orders     None         Ronny Bacon 09/11/22 2134    Malvin Johns, MD 09/11/22 2147

## 2022-09-11 NOTE — ED Triage Notes (Signed)
Pt arrived via POV, c/o SCC. Pain in back and legs. Not able to control with home meds.

## 2022-09-11 NOTE — ED Provider Triage Note (Signed)
Emergency Medicine Provider Triage Evaluation Note  Carmen [name] , a 31 y.o. female  was evaluated in triage.  She has medical history significant for sickle cell disease, chronic pain syndrome, opiate dependence and tolerance, history of major depression, and anemia of chronic disease. Pt complains of 1 week history of pain in her bilateral upper back as well as her knees and thighs.  States that she thinks that the colder weather has triggered her symptoms.  She has been trying to get by with home morphine and oxycodone, however symptoms worsen.  No chest pain, shortness of breath, fever or difficulty breathing.  She has not had pain medication in about 12 hours.  No joint swelling.  Review of Systems  Positive: Back pain, leg pain Negative: fever  Physical Exam  BP 124/83 (BP Location: Left Arm)   Pulse 80   Temp 99.1 F (37.3 C) (Oral)   Resp 17   LMP 08/10/2022   SpO2 100%  Gen:   Awake, no distress   Resp:  Normal effort, clear to auscultation bilaterally MSK:   Moves extremities without difficulty  Other:  No knee swelling, mild tachycardia  Medical Decision Making  Medically screening exam initiated at 10:15 AM.  Appropriate orders placed.  Carmen [name] was informed that the remainder of the evaluation will be completed by another provider, this initial triage assessment does not replace that evaluation, and the importance of remaining in the ED until their evaluation is complete.     Carlisle Cater, PA-C 09/11/22 1020

## 2022-09-11 NOTE — H&P (Signed)
History and Physical    GIABELLA [name] QJF:[id number] DOB: [birth year] DOA: 09/11/2022  PCP: [name], FNP  Patient coming from: home  I have personally briefly reviewed patient's old medical records in [place]  Chief Complaint: sickle cell pain crisis  HPI: Carmen [name] is a 31 y.o. female with medical history significant of sickle cell disease, vitamin D deficiency, chronic pain syndrome, opiate dependence and tolerance, depression,who presents to ED with pain in back and legs typical of her sickle cell crisis despite use of home pain medications. Patient noted symptoms x 1 week. Patient notes no fever/chills/ sob/ n/v /dysuria or diarrhea   ED Course:  Tmx 99.1, bp 124/83, hr 80 rr 17, sat 100%  Labs  UA neg Wbc 10.6, hgb 11 ( 9.7), plt 509 Na 137, K 3.6, cl 107, cr 0.58,   Tx dilaudid  '2mg'$  x 2  Review of Systems: As per HPI otherwise 10 point review of systems negative.   Past Medical History:  Diagnosis Date   Anxiety    Headache(784.0)    Heart murmur    Sickle cell crisis ([place])    Syphilis 2015   Was diagnosed and received one injection of antibiotics   Thrombocytosis 11/22/2014     CBC Latest Ref Rng & Units 12/13/2018 12/11/2018 12/10/2018 WBC 4.0 - 10.5 K/uL 14.8(H) 13.2(H) 15.9(H) Hemoglobin 12.0 - 15.0 g/dL 8.2(L) 7.7(L) 8.1(L) Hematocrit 36.0 - 46.0 % 23.7(L) 23.2(L) 24.5(L) Platelets 150 - 400 K/uL 326 399 388      Past Surgical History:  Procedure Laterality Date   CESAREAN SECTION N/A 02/05/2019   Procedure: CESAREAN SECTION;  Surgeon: [name], MD;  Location: [facility] LD [facility]S;  Service: Obstetrics;  Laterality: N/A;   CHOLECYSTECTOMY N/A 11/30/2014   Procedure: LAPAROSCOPIC CHOLECYSTECTOMY SINGLE SITE WITH INTRAOPERATIVE CHOLANGIOGRAM;  Surgeon: Michael Boston, MD;  Location: [facility] ORS;  Service: General;  Laterality: N/A;   SPLENECTOMY       reports that she has been smoking cigarettes. She has never used smokeless tobacco. She reports that she  does not drink alcohol and does not use drugs.  Allergies  Allergen Reactions   Ketamine Hives, Nausea And Vomiting and Other (See Comments)    Makes the patient "feel funny" also    Family History  Problem Relation Age of Onset   Hypertension Mother    Sickle cell anemia Sister    Kidney disease Sister        Lupus   Arthritis Sister    Sickle cell anemia Sister    Sickle cell trait Sister    Heart disease Maternal Aunt        CABG   Heart disease Maternal Uncle        CABG   Lupus Sister     Prior to Admission medications   Medication Sig Start Date End Date Taking? Authorizing Provider  betamethasone valerate ointment (VALISONE) 0.1 % APPLY TO AFFECTED AREA TWICE A DAY Patient not taking: Reported on 05/10/2022 04/07/22   [name], FNP  cyclobenzaprine (FLEXERIL) 10 MG tablet TAKE 1 TABLET BY MOUTH DAILY AS NEEDED FOR MUSCLE SPASMS. 08/24/22   [name], FNP  DULoxetine (CYMBALTA) 30 MG capsule Take 2 capsules (60 mg total) by mouth daily. 08/24/22 02/20/23  [name], FNP  folic acid (FOLVITE) 1 MG tablet Take 1 tablet (1 mg total) by mouth daily. 03/15/19   Lanae Boast, FNP  morphine (MS CONTIN) 60 MG 12 hr  tablet Take 1 tablet (60 mg total) by mouth every 12 (twelve) hours. 08/18/22   [name], FNP  Oxycodone HCl 10 MG TABS Take 1 tablet (10 mg total) by mouth every 6 (six) hours as needed (pain). 08/31/22   [name], FNP  Vitamin D, Ergocalciferol, (DRISDOL) 1.25 MG (50000 UNIT) CAPS capsule TAKE ONE CAPSULE BY MOUTH ONE TIME PER WEEK 08/24/22   [name], FNP    Physical Exam: Vitals:   09/11/22 1710 09/11/22 1748 09/11/22 1800 09/11/22 1839  BP: 120/87 134/77 119/81 132/61  Pulse: 95 73  65  Resp: '16 20 17 17  '$ Temp:    98.8 F (37.1 C)  TempSrc:      SpO2: 100% 100% 96% 100%  Weight: 86 kg     Height: '5\' 3"'$  (1.6 m)       Constitutional: NAD, calm, comfortable Vitals:   09/11/22 1710 09/11/22 1748 09/11/22  1800 09/11/22 1839  BP: 120/87 134/77 119/81 132/61  Pulse: 95 73  65  Resp: '16 20 17 17  '$ Temp:    98.8 F (37.1 C)  TempSrc:      SpO2: 100% 100% 96% 100%  Weight: 86 kg     Height: '5\' 3"'$  (1.6 m)      Eyes: PERRL, lids and conjunctivae normal ENMT: Mucous membranes are moist. Posterior pharynx clear of any exudate or lesions.Normal dentition.  Neck: normal, supple, no masses, no thyromegaly Respiratory: clear to auscultation bilaterally, no wheezing, no crackles. Normal respiratory effort. No accessory muscle use.  Cardiovascular: Regular rate and rhythm, no murmurs / rubs / gallops. No extremity edema. 2+ pedal pulses. Abdomen: no tenderness, no masses palpated. No hepatosplenomegaly. Bowel sounds positive.  Musculoskeletal: no clubbing / cyanosis. No joint deformity upper and lower extremities. Good ROM, no contractures. Normal muscle tone. Tenderness to palpation entire back. Skin: no rashes, lesions, ulcers. No induration Neurologic: CN 2-12 grossly intact. Sensation intact,Strength 5/5 in all 4.  Psychiatric: Normal judgment and insight. Alert and oriented x 3. Normal mood.    Labs on Admission: I have personally reviewed following labs and imaging studies  CBC: Recent Labs  Lab 09/11/22 1642  WBC 10.6*  NEUTROABS 5.3  HGB 11.0*  HCT 31.0*  MCV 82.9  PLT 379*   Basic Metabolic Panel: Recent Labs  Lab 09/11/22 1642  NA 137  K 3.6  CL 107  CO2 24  GLUCOSE 84  BUN 12  CREATININE 0.58  CALCIUM 10.0   GFR: Estimated Creatinine Clearance: 107.8 mL/min (by C-G formula based on SCr of 0.58 mg/dL). Liver Function Tests: Recent Labs  Lab 09/11/22 1642  AST 24  ALT 25  ALKPHOS 66  BILITOT 1.3*  PROT 8.9*  ALBUMIN 4.3   No results for input(s): "LIPASE", "AMYLASE" in the last 168 hours. No results for input(s): "AMMONIA" in the last 168 hours. Coagulation Profile: No results for input(s): "INR", "PROTIME" in the last 168 hours. Cardiac Enzymes: No results  for input(s): "CKTOTAL", "CKMB", "CKMBINDEX", "TROPONINI" in the last 168 hours. BNP (last 3 results) No results for input(s): "PROBNP" in the last 8760 hours. HbA1C: No results for input(s): "HGBA1C" in the last 72 hours. CBG: No results for input(s): "GLUCAP" in the last 168 hours. Lipid Profile: No results for input(s): "CHOL", "HDL", "LDLCALC", "TRIG", "CHOLHDL", "LDLDIRECT" in the last 72 hours. Thyroid Function Tests: No results for input(s): "TSH", "T4TOTAL", "FREET4", "T3FREE", "THYROIDAB" in the last 72 hours. Anemia Panel: Recent Labs  09/11/22 1642  RETICCTPCT 2.4   Urine analysis:    Component Value Date/Time   COLORURINE YELLOW 09/11/2022 1510   APPEARANCEUR CLEAR 09/11/2022 1510   LABSPEC 1.013 09/11/2022 1510   PHURINE 5.0 09/11/2022 1510   GLUCOSEU NEGATIVE 09/11/2022 1510   HGBUR NEGATIVE 09/11/2022 1510   BILIRUBINUR NEGATIVE 09/11/2022 1510   BILIRUBINUR negative 08/26/2021 1527   BILIRUBINUR Negative 08/10/2019 1534   KETONESUR NEGATIVE 09/11/2022 1510   PROTEINUR NEGATIVE 09/11/2022 1510   UROBILINOGEN 1.0 08/26/2021 1527   UROBILINOGEN 1.0 01/12/2018 0943   NITRITE NEGATIVE 09/11/2022 1510   LEUKOCYTESUR NEGATIVE 09/11/2022 1510    Radiological Exams on Admission: No results found.  EKG: Independently reviewed. pending  Assessment/Plan  Sickle cell pain crisis  -admit to tele -place on ivfs  -resume oral long acting - start dilaudid pca per protocol  -f/u with sickle cell team in am -de-escalate therapy as able   Vitamin D deficiency -continue  on supplements   Depression -resume home regimen    DVT prophylaxis:scd Code Status:full Family Communication: n/a Disposition Plan: patient  expected to be admitted greater than 2 midnights  Consults called: n/a Admission status: med tele    Clance Boll MD [facility]   If 7PM-7AM, please contact night-coverage [email or web address] Password [facility]  09/11/2022, 9:33 PM

## 2022-09-12 ENCOUNTER — Other Ambulatory Visit: Payer: Self-pay

## 2022-09-12 ENCOUNTER — Inpatient Hospital Stay (HOSPITAL_COMMUNITY): Payer: Medicaid Other

## 2022-09-12 DIAGNOSIS — D5700 Hb-SS disease with crisis, unspecified: Secondary | ICD-10-CM | POA: Diagnosis not present

## 2022-09-12 MED ORDER — DULOXETINE HCL 30 MG PO CPEP
60.0000 mg | ORAL_CAPSULE | Freq: Every day | ORAL | Status: DC
  Administered 2022-09-12 – 2022-09-17 (×6): 60 mg via ORAL
  Filled 2022-09-12 (×6): qty 2

## 2022-09-12 MED ORDER — OXYCODONE HCL 5 MG PO TABS
10.0000 mg | ORAL_TABLET | Freq: Four times a day (QID) | ORAL | Status: DC | PRN
  Administered 2022-09-15: 10 mg via ORAL
  Filled 2022-09-12 (×2): qty 2

## 2022-09-12 MED ORDER — HYDROMORPHONE 1 MG/ML IV SOLN
INTRAVENOUS | Status: AC
  Administered 2022-09-12: 5 mg via INTRAVENOUS

## 2022-09-12 MED ORDER — MORPHINE SULFATE ER 30 MG PO TBCR
60.0000 mg | EXTENDED_RELEASE_TABLET | Freq: Two times a day (BID) | ORAL | Status: DC
  Administered 2022-09-12 – 2022-09-17 (×12): 60 mg via ORAL
  Filled 2022-09-12 (×12): qty 2

## 2022-09-12 MED ORDER — SODIUM CHLORIDE 0.45 % IV SOLN: INTRAVENOUS | Status: DC

## 2022-09-12 MED ORDER — HYDROMORPHONE 1 MG/ML IV SOLN
INTRAVENOUS | Status: DC
  Administered 2022-09-12 – 2022-09-14 (×4): 30 mg via INTRAVENOUS
  Filled 2022-09-12 (×5): qty 30

## 2022-09-12 MED ORDER — CYCLOBENZAPRINE HCL 10 MG PO TABS
10.0000 mg | ORAL_TABLET | Freq: Every day | ORAL | Status: DC | PRN
  Administered 2022-09-14: 10 mg via ORAL
  Filled 2022-09-12: qty 1

## 2022-09-12 NOTE — Progress Notes (Signed)
Patient ID: Carmen [name], female   DOB: Nov 15, 1990, 31 y.o.   MRN: [id number] Subjective: Carmen [name] is a 31 year old female with a medical history significant for sickle cell disease, chronic pain syndrome, opiate dependence and tolerance, and history of depression was admitted for sickle cell pain crisis.   Today, patient is still in a lot of pain especially in her lower extremities and lower back.  Her Dilaudid PCA order has expired after 4 hours of use and she has not been started on her oral home medications.  Other than the pain she has no other complaint.  She denies any fever, cough, chest pain, shortness of breath, nausea, vomiting or diarrhea.  No urinary symptoms.  Objective:  Vital signs in last 24 hours:  Vitals:   09/12/22 0412 09/12/22 1000 09/12/22 1317 09/12/22 1341  BP:  95/66  111/71  Pulse:  61  64  Resp: '16  12 17  '$ Temp:  98.5 F (36.9 C)  97.8 F (36.6 C)  TempSrc:  Oral  Oral  SpO2: 100% 100% 96% 100%  Weight:      Height:        Intake/Output from previous day:   Intake/Output Summary (Last 24 hours) at 09/12/2022 1342 Last data filed at 09/12/2022 0908 Gross per 24 hour  Intake 1822.74 ml  Output --  Net 1822.74 ml    Physical Exam: General: Alert, awake, oriented x3, in no acute distress.  HEENT: [place]/AT PEERL, EOMI Neck: Trachea midline,  no masses, no thyromegal,y no JVD, no carotid bruit OROPHARYNX:  Moist, No exudate/ erythema/lesions.  Heart: Regular rate and rhythm, without murmurs, rubs, gallops, PMI non-displaced, no heaves or thrills on palpation.  Lungs: Clear to auscultation, no wheezing or rhonchi noted. No increased vocal fremitus resonant to percussion  Abdomen: Soft, nontender, nondistended, positive bowel sounds, no masses no hepatosplenomegaly noted..  Neuro: No focal neurological deficits noted cranial nerves II through XII grossly intact. DTRs 2+ bilaterally upper and lower extremities. Strength 5 out of 5 in bilateral upper and  lower extremities. Musculoskeletal: No warm swelling or erythema around joints, no spinal tenderness noted. Psychiatric: Patient alert and oriented x3, good insight and cognition, good recent to remote recall. Lymph node survey: No cervical axillary or inguinal lymphadenopathy noted.  Lab Results:  Basic Metabolic Panel:    Component Value Date/Time   NA 137 09/11/2022 1642   NA 142 08/24/2022 1213   K 3.6 09/11/2022 1642   CL 107 09/11/2022 1642   CO2 24 09/11/2022 1642   BUN 12 09/11/2022 1642   BUN 10 08/24/2022 1213   CREATININE 0.54 09/11/2022 2240   GLUCOSE 84 09/11/2022 1642   CALCIUM 10.0 09/11/2022 1642   CBC:    Component Value Date/Time   WBC 10.4 09/11/2022 2240   HGB 10.5 (L) 09/11/2022 2240   HGB 9.7 (L) 08/24/2022 1213   HCT 29.8 (L) 09/11/2022 2240   HCT 30.1 (L) 08/24/2022 1213   PLT 439 (H) 09/11/2022 2240   PLT 477 (H) 08/24/2022 1213   MCV 83.0 09/11/2022 2240   MCV 88 08/24/2022 1213   NEUTROABS 5.3 09/11/2022 1642   NEUTROABS 3.3 08/24/2022 1213   LYMPHSABS 4.2 (H) 09/11/2022 1642   LYMPHSABS 2.8 08/24/2022 1213   MONOABS 0.9 09/11/2022 1642   EOSABS 0.1 09/11/2022 1642   EOSABS 0.2 08/24/2022 1213   BASOSABS 0.0 09/11/2022 1642   BASOSABS 0.1 08/24/2022 1213    No results found for this or any previous visit (from  the past 240 hour(s)).  Studies/Results: DG Chest 2 View  Result Date: 09/12/2022 CLINICAL DATA:  Sickle cell crisis. EXAM: CHEST - 2 VIEW COMPARISON:  08/13/2022 FINDINGS: Cardiac silhouette is normal in size. Normal mediastinal and hilar contours. Clear lungs.  No pleural effusion or pneumothorax. Skeletal structures are intact. IMPRESSION: No active cardiopulmonary disease. Electronically Signed   By: Lajean Manes M.D.   On: 09/12/2022 10:06    Medications: Scheduled Meds:  folic acid  1 mg Oral Daily   heparin  5,000 Units Subcutaneous Q8H   ketorolac  15 mg Intravenous Q6H   morphine  60 mg Oral Q12H   pantoprazole  40  mg Oral Daily   Or   pantoprazole (PROTONIX) IV  40 mg Intravenous Daily   senna-docusate  1 tablet Oral BID   [START ON 09/17/2022] Vitamin D (Ergocalciferol)  50,000 Units Oral Q7 days   Continuous Infusions:  dextrose 5 % and 0.45% NaCl 100 mL/hr at 09/12/22 0531   HYDROmorphone     PRN Meds:.diphenhydrAMINE, naloxone **AND** sodium chloride flush, ondansetron (ZOFRAN) IV, polyethylene glycol, promethazine **OR** promethazine  Consultants: None  Procedures: None  Antibiotics: None  Assessment/Plan: Active Problems:   Sickle cell crisis (HCC)  Hb Sickle Cell Disease with Pain crisis: Reduce IVF 0.45% Saline to 50 mls/hour, restart weight based Dilaudid PCA at 0.6/10/4, continue IV Toradol 15 mg Q 6 H for total of 5 days, restart and continue oral home pain medications.  Monitor vitals very closely, Re-evaluate pain scale regularly, 2 L of Oxygen by [place]. Major depressive disorder: Clinically stable. Patient denies any suicidal ideations or thoughts. Patient was counseled extensively.  Continue home medication. Anemia of Chronic Disease: Hemoglobin appears stable at baseline today. There is no clinical indication for blood transfusion at this time.  We will monitor very closely and transfuse as appropriate. Chronic pain Syndrome: Restart and continue home pain medications.  Code Status: Full Code Family Communication: N/A Disposition Plan: Not yet ready for discharge  Lakeyn Dokken  If 7PM-7AM, please contact night-coverage.  09/12/2022, 1:42 PM  LOS: 1 day

## 2022-09-12 NOTE — Progress Notes (Signed)
Dilaudid PCA  0800 used 5 mg, 11 demands, 10 given, CO2 44, R 17  1200 used 5 mg, 10 demands, 10 given, CO2 42, R 16  1600 used 5.4 mg, 10 demands, 19 given, CO2 39, R14

## 2022-09-12 NOTE — Care Management (Signed)
Overnight admitted for sickle cell crisis. Patient transferred to sickle cell services and accepted. TRH will sign off

## 2022-09-12 NOTE — Plan of Care (Signed)

## 2022-09-13 DIAGNOSIS — D5700 Hb-SS disease with crisis, unspecified: Secondary | ICD-10-CM | POA: Diagnosis not present

## 2022-09-13 LAB — CBC WITH DIFFERENTIAL/PLATELET
Abs Immature Granulocytes: 0.04 10*3/uL (ref 0.00–0.07)
Basophils Absolute: 0.1 10*3/uL (ref 0.0–0.1)
Basophils Relative: 0 %
Eosinophils Absolute: 0.3 10*3/uL (ref 0.0–0.5)
Eosinophils Relative: 3 %
HCT: 27.1 % — ABNORMAL LOW (ref 36.0–46.0)
Hemoglobin: 9.8 g/dL — ABNORMAL LOW (ref 12.0–15.0)
Immature Granulocytes: 0 %
Lymphocytes Relative: 36 %
Lymphs Abs: 4.3 10*3/uL — ABNORMAL HIGH (ref 0.7–4.0)
MCH: 29.4 pg (ref 26.0–34.0)
MCHC: 36.2 g/dL — ABNORMAL HIGH (ref 30.0–36.0)
MCV: 81.4 fL (ref 80.0–100.0)
Monocytes Absolute: 1.4 10*3/uL — ABNORMAL HIGH (ref 0.1–1.0)
Monocytes Relative: 11 %
Neutro Abs: 5.9 10*3/uL (ref 1.7–7.7)
Neutrophils Relative %: 50 %
Platelets: 352 10*3/uL (ref 150–400)
RBC: 3.33 MIL/uL — ABNORMAL LOW (ref 3.87–5.11)
RDW: 13.6 % (ref 11.5–15.5)
WBC: 12 10*3/uL — ABNORMAL HIGH (ref 4.0–10.5)
nRBC: 0 % (ref 0.0–0.2)

## 2022-09-13 LAB — HIGH SENSITIVITY CRP: CRP, High Sensitivity: 3.5 mg/L — ABNORMAL HIGH (ref 0.00–3.00)

## 2022-09-13 NOTE — Progress Notes (Signed)
  Transition of Care [facility]) Screening Note   Patient Details  Name: SKARLET LYONS Date of Birth: 1991-02-12   Transition of Care [facility]) CM/SW Contact:    Vassie Moselle, LCSW Phone Number: 09/13/2022, 9:04 AM    Transition of Care Department [facility]) has reviewed patient and no TOC needs have been identified at this time. We will continue to monitor patient advancement through interdisciplinary progression rounds. If new patient transition needs arise, please place a TOC consult.

## 2022-09-13 NOTE — TOC Progression Note (Signed)
Transition of Care [facility]) - Progression Note    Patient Details  Name: CLOTINE HEINER MRN: [id number] Date of Birth: [birth year]  Transition of Care [facility]) CM/SW [place], LCSW Phone Number: 09/13/2022, 2:06 PM  Clinical Narrative:    CSW received phone all from pt's case manager at [facility], Abigail Miyamoto who shares that this pt has not been satisfied with services received the [facility] and has been wanting to get established with [facility].  Met with pt who confirms this and is agreeable to referral being made to [facility]. A referral for new patient appointment has been made to [facility]. [facility] will call pt to schedule an appointment.   Expected Discharge Plan and Services Expected Discharge Plan: Home/Self Care                                               Social Determinants of Health (SDOH) Interventions Food Insecurity Interventions: Intervention Not Indicated Transportation Interventions: Intervention Not Indicated  Readmission Risk Interventions    09/13/2022    2:06 PM 09/13/2022    9:03 AM 08/16/2022    2:56 PM  Readmission Risk Prevention Plan  Transportation Screening Complete Complete Complete  Medication Review Press [employer])  Complete Complete  PCP or Specialist appointment within 3-5 days of discharge  Complete Complete  HRI or Home Care Consult  Complete Complete  SW Recovery Care/Counseling Consult  Complete Complete  Palliative Care Screening  Not Applicable Not [place]  Not Applicable Not Applicable

## 2022-09-14 NOTE — Progress Notes (Signed)
Subjective: Carmen [name] is a 31 year old female with a medical history significant for sickle cell disease, chronic pain syndrome, opiate dependence and tolerance, anemia of chronic disease, and history of major depression was admitted for sickle cell pain crisis. Patient states that pain intensity has not improved to her low back overnight.  She rates pain at 8/10.  Patient characterizes pain as constant and aching.  She is unable to find a comfortable position. Patient denies headache, chest pain, urinary symptoms, nausea, vomiting, or diarrhea.  Objective:  Vital signs in last 24 hours:  Vitals:   09/13/22 2147 09/14/22 0114 09/14/22 0153 09/14/22 0558  BP: 129/77  123/80 127/71  Pulse: 92  73 (!) 101  Resp: '16 16 16 20  '$ Temp: 97.8 F (36.6 C)  98.7 F (37.1 C) 97.9 F (36.6 C)  TempSrc: Oral  Oral Oral  SpO2: 98% 98% 97% 99%  Weight:      Height:        Intake/Output from previous day:  No intake or output data in the 24 hours ending 09/14/22 0851  Physical Exam: General: Alert, awake, oriented x3, in no acute distress.  HEENT: [place]/AT PEERL, EOMI Neck: Trachea midline,  no masses, no thyromegal,y no JVD, no carotid bruit OROPHARYNX:  Moist, No exudate/ erythema/lesions.  Heart: Regular rate and rhythm, without murmurs, rubs, gallops, PMI non-displaced, no heaves or thrills on palpation.  Lungs: Clear to auscultation, no wheezing or rhonchi noted. No increased vocal fremitus resonant to percussion  Abdomen: Soft, nontender, nondistended, positive bowel sounds, no masses no hepatosplenomegaly noted..  Neuro: No focal neurological deficits noted cranial nerves II through XII grossly intact. DTRs 2+ bilaterally upper and lower extremities. Strength 5 out of 5 in bilateral upper and lower extremities. Musculoskeletal: No warm swelling or erythema around joints, no spinal tenderness noted. Psychiatric: Patient alert and oriented x3, good insight and cognition, good recent to  remote recall. Lymph node survey: No cervical axillary or inguinal lymphadenopathy noted.  Lab Results:  Basic Metabolic Panel:    Component Value Date/Time   NA 137 09/11/2022 1642   NA 142 08/24/2022 1213   K 3.6 09/11/2022 1642   CL 107 09/11/2022 1642   CO2 24 09/11/2022 1642   BUN 12 09/11/2022 1642   BUN 10 08/24/2022 1213   CREATININE 0.54 09/11/2022 2240   GLUCOSE 84 09/11/2022 1642   CALCIUM 10.0 09/11/2022 1642   CBC:    Component Value Date/Time   WBC 12.0 (H) 09/13/2022 0535   HGB 9.8 (L) 09/13/2022 0535   HGB 9.7 (L) 08/24/2022 1213   HCT 27.1 (L) 09/13/2022 0535   HCT 30.1 (L) 08/24/2022 1213   PLT 352 09/13/2022 0535   PLT 477 (H) 08/24/2022 1213   MCV 81.4 09/13/2022 0535   MCV 88 08/24/2022 1213   NEUTROABS 5.9 09/13/2022 0535   NEUTROABS 3.3 08/24/2022 1213   LYMPHSABS 4.3 (H) 09/13/2022 0535   LYMPHSABS 2.8 08/24/2022 1213   MONOABS 1.4 (H) 09/13/2022 0535   EOSABS 0.3 09/13/2022 0535   EOSABS 0.2 08/24/2022 1213   BASOSABS 0.1 09/13/2022 0535   BASOSABS 0.1 08/24/2022 1213    No results found for this or any previous visit (from the past 240 hour(s)).  Studies/Results: DG Chest 2 View  Result Date: 09/12/2022 CLINICAL DATA:  Sickle cell crisis. EXAM: CHEST - 2 VIEW COMPARISON:  08/13/2022 FINDINGS: Cardiac silhouette is normal in size. Normal mediastinal and hilar contours. Clear lungs.  No pleural effusion or pneumothorax. Skeletal structures  are intact. IMPRESSION: No active cardiopulmonary disease. Electronically Signed   By: Lajean Manes M.D.   On: 09/12/2022 10:06    Medications: Scheduled Meds:  DULoxetine  60 mg Oral Daily   folic acid  1 mg Oral Daily   heparin  5,000 Units Subcutaneous Q8H   ketorolac  15 mg Intravenous Q6H   morphine  60 mg Oral Q12H   pantoprazole  40 mg Oral Daily   senna-docusate  1 tablet Oral BID   [START ON 09/17/2022] Vitamin D (Ergocalciferol)  50,000 Units Oral Q7 days   Continuous Infusions:   sodium chloride 50 mL/hr at 09/12/22 1430   HYDROmorphone     PRN Meds:.cyclobenzaprine, diphenhydrAMINE, naloxone **AND** sodium chloride flush, ondansetron (ZOFRAN) IV, oxyCODONE, polyethylene glycol, promethazine **OR** promethazine  Consultants: none  Procedures: none  Antibiotics: none  Assessment/Plan: Principal Problem:   Sickle cell crisis (HCC) Active Problems:   Anemia of chronic disease   Major depressive disorder   Chronic pain syndrome  Sickle cell disease with pain crisis: Continue IV Dilaudid PCA, settings decreased today Toradol 15 mg IV every 6 hours MS Contin 60 mg every 12 hours Oxycodone 10 mg every 6 hours as needed for breakthrough pain Monitor vital signs very closely, reevaluate pain scale regularly, and supplemental oxygen as needed.  Anemia of chronic disease: Patient's hemoglobin remained stable and consistent with her baseline.  There is no clinical indication for blood transfusion at this time.  Chronic pain syndrome: Continue home medications  Major depression: Stable.  Continue home medications.  No suicidal homicidal intentions today.  Code Status: Full Code Family Communication: N/A Disposition Plan: Not yet ready for discharge  [place], MSN, FNP-C Patient [facility] [address] [place], [place] [address] [phone]  If 7PM-7AM, please contact night-coverage.  09/14/2022, 8:51 AM  LOS: 3 days

## 2022-09-14 NOTE — Progress Notes (Signed)
Subjective: Carmen [name] is a 31 year old female with a medical history significant for sickle cell disease, chronic pain syndrome, opiate dependence and tolerance, anemia of chronic disease, and history of major depression was admitted for sickle cell pain crisis. Patient is complaining of pain primarily to her low back and lower extremities.  She rates pain at 8/10.  Patient characterizes pain as constant and aching.    Patient denies headache, chest pain, urinary symptoms, nausea, vomiting, or diarrhea.  Objective:  Vital signs in last 24 hours:  Vitals:   09/14/22 0558 09/14/22 1026 09/14/22 1355 09/14/22 1640  BP: 127/71 126/71 124/76   Pulse: (!) 101 88 82   Resp: '20 20 20 19  '$ Temp: 97.9 F (36.6 C) 98.8 F (37.1 C) 98.2 F (36.8 C)   TempSrc: Oral Oral Oral   SpO2: 99% 100% 100% 99%  Weight:      Height:        Intake/Output from previous day:  No intake or output data in the 24 hours ending 09/14/22 1824  Physical Exam: General: Alert, awake, oriented x3, in no acute distress.  HEENT: [place]/AT PEERL, EOMI Neck: Trachea midline,  no masses, no thyromegal,y no JVD, no carotid bruit OROPHARYNX:  Moist, No exudate/ erythema/lesions.  Heart: Regular rate and rhythm, without murmurs, rubs, gallops, PMI non-displaced, no heaves or thrills on palpation.  Lungs: Clear to auscultation, no wheezing or rhonchi noted. No increased vocal fremitus resonant to percussion  Abdomen: Soft, nontender, nondistended, positive bowel sounds, no masses no hepatosplenomegaly noted..  Neuro: No focal neurological deficits noted cranial nerves II through XII grossly intact. DTRs 2+ bilaterally upper and lower extremities. Strength 5 out of 5 in bilateral upper and lower extremities. Musculoskeletal: No warm swelling or erythema around joints, no spinal tenderness noted. Psychiatric: Patient alert and oriented x3, good insight and cognition, good recent to remote recall. Lymph node survey: No  cervical axillary or inguinal lymphadenopathy noted.  Lab Results:  Basic Metabolic Panel:    Component Value Date/Time   NA 137 09/11/2022 1642   NA 142 08/24/2022 1213   K 3.6 09/11/2022 1642   CL 107 09/11/2022 1642   CO2 24 09/11/2022 1642   BUN 12 09/11/2022 1642   BUN 10 08/24/2022 1213   CREATININE 0.54 09/11/2022 2240   GLUCOSE 84 09/11/2022 1642   CALCIUM 10.0 09/11/2022 1642   CBC:    Component Value Date/Time   WBC 12.0 (H) 09/13/2022 0535   HGB 9.8 (L) 09/13/2022 0535   HGB 9.7 (L) 08/24/2022 1213   HCT 27.1 (L) 09/13/2022 0535   HCT 30.1 (L) 08/24/2022 1213   PLT 352 09/13/2022 0535   PLT 477 (H) 08/24/2022 1213   MCV 81.4 09/13/2022 0535   MCV 88 08/24/2022 1213   NEUTROABS 5.9 09/13/2022 0535   NEUTROABS 3.3 08/24/2022 1213   LYMPHSABS 4.3 (H) 09/13/2022 0535   LYMPHSABS 2.8 08/24/2022 1213   MONOABS 1.4 (H) 09/13/2022 0535   EOSABS 0.3 09/13/2022 0535   EOSABS 0.2 08/24/2022 1213   BASOSABS 0.1 09/13/2022 0535   BASOSABS 0.1 08/24/2022 1213    No results found for this or any previous visit (from the past 240 hour(s)).  Studies/Results: No results found.  Medications: Scheduled Meds:  DULoxetine  60 mg Oral Daily   folic acid  1 mg Oral Daily   heparin  5,000 Units Subcutaneous Q8H   ketorolac  15 mg Intravenous Q6H   morphine  60 mg Oral Q12H   pantoprazole  40 mg Oral Daily   senna-docusate  1 tablet Oral BID   [START ON 09/17/2022] Vitamin D (Ergocalciferol)  50,000 Units Oral Q7 days   Continuous Infusions:  sodium chloride 50 mL/hr at 09/14/22 1213   HYDROmorphone     PRN Meds:.cyclobenzaprine, diphenhydrAMINE, naloxone **AND** sodium chloride flush, ondansetron (ZOFRAN) IV, oxyCODONE, polyethylene glycol, promethazine **OR** promethazine  Consultants: none  Procedures: none  Antibiotics: none  Assessment/Plan: Principal Problem:   Sickle cell crisis (HCC) Active Problems:   Anemia of chronic disease   Major depressive  disorder   Chronic pain syndrome  Sickle cell disease with pain crisis: Continue IV Dilaudid PCA, without any changes in settings Toradol 15 mg IV every 6 hours MS Contin 60 mg every 12 hours Oxycodone 10 mg every 6 hours as needed for breakthrough pain Monitor vital signs very closely, reevaluate pain scale regularly, and supplemental oxygen as needed.  Anemia of chronic disease: Patient's hemoglobin remained stable and consistent with her baseline.  There is no clinical indication for blood transfusion at this time.  Chronic pain syndrome: Continue home medications  Major depression: Stable.  Continue home medications.  No suicidal homicidal intentions today.  Code Status: Full Code Family Communication: N/A Disposition Plan: Not yet ready for discharge  [place], MSN, FNP-C Patient [facility] [address] [place], [place] [address] [phone]  If 7PM-7AM, please contact night-coverage.  09/14/2022, 6:24 PM  LOS: 3 days

## 2022-09-15 DIAGNOSIS — D5700 Hb-SS disease with crisis, unspecified: Secondary | ICD-10-CM | POA: Diagnosis not present

## 2022-09-15 MED ORDER — SODIUM CHLORIDE 0.9 % IV SOLN
12.5000 mg | Freq: Once | INTRAVENOUS | Status: AC
  Administered 2022-09-15: 12.5 mg via INTRAVENOUS
  Filled 2022-09-15: qty 12.5

## 2022-09-15 MED ORDER — HYDROMORPHONE 1 MG/ML IV SOLN
INTRAVENOUS | Status: DC
  Administered 2022-09-15: 30 mg via INTRAVENOUS
  Administered 2022-09-15: 10.2 mg via INTRAVENOUS
  Administered 2022-09-16: 30 mg via INTRAVENOUS
  Filled 2022-09-15 (×2): qty 30

## 2022-09-15 MED ORDER — OXYCODONE HCL 5 MG PO TABS
10.0000 mg | ORAL_TABLET | ORAL | Status: DC
  Administered 2022-09-15 – 2022-09-17 (×10): 10 mg via ORAL
  Filled 2022-09-15 (×10): qty 2

## 2022-09-15 NOTE — Progress Notes (Signed)
Subjective: Carmen [name] is a 31 year old female with a medical history significant for sickle cell disease, chronic pain syndrome, opiate dependence and tolerance, anemia of chronic disease, and history of major depression was admitted for sickle cell pain crisis. Patient states that pain intensity has not improved to her low back overnight. She has not been able to ambulate in room.  She rates pain at 8/10.  Patient characterizes pain as constant and aching.  She is unable to find a comfortable position. Patient denies headache, chest pain, urinary symptoms,shortness of breath, or dizziness. The patient endorses nausea and vomiting.   Objective:  Vital signs in last 24 hours:  Vitals:   09/14/22 1355 09/14/22 1640 09/14/22 2224 09/15/22 0301  BP: 124/76  127/69 119/65  Pulse: 82  (!) 101 100  Resp: '20 19 20 20  '$ Temp: 98.2 F (36.8 C)  98.4 F (36.9 C) 98.2 F (36.8 C)  TempSrc: Oral  Oral Oral  SpO2: 100% 99% 100% 100%  Weight:      Height:        Intake/Output from previous day:  No intake or output data in the 24 hours ending 09/15/22 1048  Physical Exam: General: Alert, awake, oriented x3, in no acute distress.  HEENT: [place]/AT PEERL, EOMI Neck: Trachea midline,  no masses, no thyromegal,y no JVD, no carotid bruit OROPHARYNX:  Moist, No exudate/ erythema/lesions.  Heart: Regular rate and rhythm, without murmurs, rubs, gallops, PMI non-displaced, no heaves or thrills on palpation.  Lungs: Clear to auscultation, no wheezing or rhonchi noted. No increased vocal fremitus resonant to percussion  Abdomen: Soft, nontender, nondistended, positive bowel sounds, no masses no hepatosplenomegaly noted..  Neuro: No focal neurological deficits noted cranial nerves II through XII grossly intact. DTRs 2+ bilaterally upper and lower extremities. Strength 5 out of 5 in bilateral upper and lower extremities. Musculoskeletal: No warm swelling or erythema around joints, no spinal tenderness  noted. Psychiatric: Patient alert and oriented x3, good insight and cognition, good recent to remote recall. Lymph node survey: No cervical axillary or inguinal lymphadenopathy noted.  Lab Results:  Basic Metabolic Panel:    Component Value Date/Time   NA 137 09/11/2022 1642   NA 142 08/24/2022 1213   K 3.6 09/11/2022 1642   CL 107 09/11/2022 1642   CO2 24 09/11/2022 1642   BUN 12 09/11/2022 1642   BUN 10 08/24/2022 1213   CREATININE 0.54 09/11/2022 2240   GLUCOSE 84 09/11/2022 1642   CALCIUM 10.0 09/11/2022 1642   CBC:    Component Value Date/Time   WBC 12.0 (H) 09/13/2022 0535   HGB 9.8 (L) 09/13/2022 0535   HGB 9.7 (L) 08/24/2022 1213   HCT 27.1 (L) 09/13/2022 0535   HCT 30.1 (L) 08/24/2022 1213   PLT 352 09/13/2022 0535   PLT 477 (H) 08/24/2022 1213   MCV 81.4 09/13/2022 0535   MCV 88 08/24/2022 1213   NEUTROABS 5.9 09/13/2022 0535   NEUTROABS 3.3 08/24/2022 1213   LYMPHSABS 4.3 (H) 09/13/2022 0535   LYMPHSABS 2.8 08/24/2022 1213   MONOABS 1.4 (H) 09/13/2022 0535   EOSABS 0.3 09/13/2022 0535   EOSABS 0.2 08/24/2022 1213   BASOSABS 0.1 09/13/2022 0535   BASOSABS 0.1 08/24/2022 1213    No results found for this or any previous visit (from the past 240 hour(s)).  Studies/Results: No results found.  Medications: Scheduled Meds:  DULoxetine  60 mg Oral Daily   folic acid  1 mg Oral Daily   heparin  5,000  Units Subcutaneous Q8H   ketorolac  15 mg Intravenous Q6H   morphine  60 mg Oral Q12H   oxyCODONE  10 mg Oral Q4H while awake   pantoprazole  40 mg Oral Daily   senna-docusate  1 tablet Oral BID   [START ON 09/17/2022] Vitamin D (Ergocalciferol)  50,000 Units Oral Q7 days   Continuous Infusions:  sodium chloride 50 mL/hr at 09/14/22 1213   HYDROmorphone     PRN Meds:.cyclobenzaprine, diphenhydrAMINE, naloxone **AND** sodium chloride flush, ondansetron (ZOFRAN) IV, polyethylene glycol, promethazine **OR**  promethazine  Consultants: none  Procedures: none  Antibiotics: none  Assessment/Plan: Principal Problem:   Sickle cell crisis (HCC) Active Problems:   Anemia of chronic disease   Major depressive disorder   Chronic pain syndrome  Sickle cell disease with pain crisis: Weaning IV dilaudid PCA Toradol 15 mg IV every 6 hours MS Contin 60 mg every 12 hours Oxycodone 10 mg every 4 hours while awake Monitor vital signs very closely, reevaluate pain scale regularly, and supplemental oxygen as needed. Patient encouraged to ambulate in hall in preparation for discharge on tomorrow.   Anemia of chronic disease: Patient's hemoglobin remained stable and consistent with her baseline.  There is no clinical indication for blood transfusion at this time.  Chronic pain syndrome: Continue home medications  Major depression: Stable.  Continue home medications.  No suicidal homicidal intentions today.  Code Status: Full Code Family Communication: N/A Disposition Plan: Not yet ready for discharge  [place], MSN, FNP-C Patient [facility] [address] [place], [place] [address] [phone]  If 7PM-7AM, please contact night-coverage.  09/15/2022, 10:48 AM  LOS: 4 days

## 2022-09-15 NOTE — Progress Notes (Signed)
2220 pt called front desk asked for muscle relaxer. Desk called me, I checked. Flexeril order DAILy. Went into pt room with 2200 meds and informed her the order was only 1x day, pt asked about her oxycodone she asked for I informed her I was only told about her flexeril but I could check on the oxy, as I checked the [facility] to see if she had oxy order she started to argue and accuse "[place]" of calling her liar. I calmly asked for clarification as to who she feels  is calling her a liar and why.. 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 I'm in pain and need it" "ma'am, I have your scheduled MS contin here would you like that? "Yes, I just said I'm in pain, I wish this shit was recorded yall be playing games" " again I am sorry you feel that way, you do oxy ordered and I apologize for the mix up, maybe secretary forgot but I am not going to speak for someone else", "you just did" I also have a heparin shot that goes in your stomach", " I don't take that" " okay you are refusing meds then?", " can I get the oxy or not?" " I will be right back, I'm going to check". 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 Return to pt room to inform we have to wait an hour per policy, do you mind if I take a quick listen and look at you for your assessment" " yes I do", " you do mind? Ok so you are refusing patient care?", Yes. Okay. Pt later call out only for PRN pain meds and still refuses assessment.

## 2022-09-16 DIAGNOSIS — D5700 Hb-SS disease with crisis, unspecified: Secondary | ICD-10-CM | POA: Diagnosis not present

## 2022-09-16 LAB — CBC WITH DIFFERENTIAL/PLATELET
Abs Immature Granulocytes: 0.12 10*3/uL — ABNORMAL HIGH (ref 0.00–0.07)
Basophils Absolute: 0.1 10*3/uL (ref 0.0–0.1)
Basophils Relative: 1 %
Eosinophils Absolute: 0.5 10*3/uL (ref 0.0–0.5)
Eosinophils Relative: 4 %
HCT: 23.7 % — ABNORMAL LOW (ref 36.0–46.0)
Hemoglobin: 8.3 g/dL — ABNORMAL LOW (ref 12.0–15.0)
Immature Granulocytes: 1 %
Lymphocytes Relative: 34 %
Lymphs Abs: 4.2 10*3/uL — ABNORMAL HIGH (ref 0.7–4.0)
MCH: 28.9 pg (ref 26.0–34.0)
MCHC: 35 g/dL (ref 30.0–36.0)
MCV: 82.6 fL (ref 80.0–100.0)
Monocytes Absolute: 1.8 10*3/uL — ABNORMAL HIGH (ref 0.1–1.0)
Monocytes Relative: 15 %
Neutro Abs: 5.7 10*3/uL (ref 1.7–7.7)
Neutrophils Relative %: 45 %
Platelets: 385 10*3/uL (ref 150–400)
RBC: 2.87 MIL/uL — ABNORMAL LOW (ref 3.87–5.11)
RDW: 15.4 % (ref 11.5–15.5)
WBC: 12.4 10*3/uL — ABNORMAL HIGH (ref 4.0–10.5)
nRBC: 1.3 % — ABNORMAL HIGH (ref 0.0–0.2)

## 2022-09-16 MED ORDER — CYCLOBENZAPRINE HCL 10 MG PO TABS: 10.0000 mg | ORAL_TABLET | Freq: Three times a day (TID) | ORAL | Status: DC | PRN

## 2022-09-16 MED ORDER — HYDROMORPHONE 1 MG/ML IV SOLN
INTRAVENOUS | Status: DC
  Administered 2022-09-16: 8 mg via INTRAVENOUS
  Administered 2022-09-17: 30 mg via INTRAVENOUS
  Filled 2022-09-16: qty 30

## 2022-09-16 NOTE — Plan of Care (Signed)

## 2022-09-16 NOTE — Progress Notes (Signed)
Subjective: Carmen [name] is a 31 year old female with a medical history significant for sickle cell disease, chronic pain syndrome, opiate dependence and tolerance, anemia of chronic disease, and history of major depression was admitted for sickle cell pain crisis.  Patient has no new complaints on today.  She continues to complain of low back pain.  She is requesting 1 more day in the hospital.  She rates her pain as 7/10 and feels that she cannot function at home at this time. Patient denies headache, chest pain, urinary symptoms,shortness of breath, or dizziness. The patient endorses nausea and vomiting.   Objective:  Vital signs in last 24 hours:  Vitals:   09/15/22 1745 09/15/22 2144 09/15/22 2304 09/16/22 0307  BP: 131/77 121/76    Pulse: (!) 101 (!) 104    Resp: '18 16 16 16  '$ Temp: 98 F (36.7 C) 98.6 F (37 C)    TempSrc:  Oral    SpO2: 98% 100% 100% 100%  Weight:      Height:        Intake/Output from previous day:   Intake/Output Summary (Last 24 hours) at 09/16/2022 1132 Last data filed at 09/16/2022 0307 Gross per 24 hour  Intake 2038.42 ml  Output --  Net 2038.42 ml    Physical Exam: General: Alert, awake, oriented x3, in no acute distress.  HEENT: [place]/AT PEERL, EOMI Neck: Trachea midline,  no masses, no thyromegal,y no JVD, no carotid bruit OROPHARYNX:  Moist, No exudate/ erythema/lesions.  Heart: Regular rate and rhythm, without murmurs, rubs, gallops, PMI non-displaced, no heaves or thrills on palpation.  Lungs: Clear to auscultation, no wheezing or rhonchi noted. No increased vocal fremitus resonant to percussion  Abdomen: Soft, nontender, nondistended, positive bowel sounds, no masses no hepatosplenomegaly noted..  Neuro: No focal neurological deficits noted cranial nerves II through XII grossly intact. DTRs 2+ bilaterally upper and lower extremities. Strength 5 out of 5 in bilateral upper and lower extremities. Musculoskeletal: No warm swelling or erythema  around joints, no spinal tenderness noted. Psychiatric: Patient alert and oriented x3, good insight and cognition, good recent to remote recall. Lymph node survey: No cervical axillary or inguinal lymphadenopathy noted.  Lab Results:  Basic Metabolic Panel:    Component Value Date/Time   NA 137 09/11/2022 1642   NA 142 08/24/2022 1213   K 3.6 09/11/2022 1642   CL 107 09/11/2022 1642   CO2 24 09/11/2022 1642   BUN 12 09/11/2022 1642   BUN 10 08/24/2022 1213   CREATININE 0.54 09/11/2022 2240   GLUCOSE 84 09/11/2022 1642   CALCIUM 10.0 09/11/2022 1642   CBC:    Component Value Date/Time   WBC 12.4 (H) 09/16/2022 0953   HGB 8.3 (L) 09/16/2022 0953   HGB 9.7 (L) 08/24/2022 1213   HCT 23.7 (L) 09/16/2022 0953   HCT 30.1 (L) 08/24/2022 1213   PLT 385 09/16/2022 0953   PLT 477 (H) 08/24/2022 1213   MCV 82.6 09/16/2022 0953   MCV 88 08/24/2022 1213   NEUTROABS 5.7 09/16/2022 0953   NEUTROABS 3.3 08/24/2022 1213   LYMPHSABS 4.2 (H) 09/16/2022 0953   LYMPHSABS 2.8 08/24/2022 1213   MONOABS 1.8 (H) 09/16/2022 0953   EOSABS 0.5 09/16/2022 0953   EOSABS 0.2 08/24/2022 1213   BASOSABS 0.1 09/16/2022 0953   BASOSABS 0.1 08/24/2022 1213    No results found for this or any previous visit (from the past 240 hour(s)).  Studies/Results: No results found.  Medications: Scheduled Meds:  DULoxetine  60 mg Oral Daily   folic acid  1 mg Oral Daily   heparin  5,000 Units Subcutaneous Q8H   ketorolac  15 mg Intravenous Q6H   morphine  60 mg Oral Q12H   oxyCODONE  10 mg Oral Q4H while awake   pantoprazole  40 mg Oral Daily   senna-docusate  1 tablet Oral BID   [START ON 09/17/2022] Vitamin D (Ergocalciferol)  50,000 Units Oral Q7 days   Continuous Infusions:  sodium chloride 10 mL/hr at 09/15/22 1156   HYDROmorphone     PRN Meds:.cyclobenzaprine, diphenhydrAMINE, naloxone **AND** sodium chloride flush, ondansetron (ZOFRAN) IV, polyethylene glycol, promethazine **OR**  promethazine  Consultants: none  Procedures: none  Antibiotics: none  Assessment/Plan: Principal Problem:   Sickle cell crisis (HCC) Active Problems:   Anemia of chronic disease   Major depressive disorder   Chronic pain syndrome  Sickle cell disease with pain crisis: Weaning IV dilaudid PCA Toradol 15 mg IV every 6 hours MS Contin 60 mg every 12 hours Oxycodone 10 mg every 4 hours while awake Monitor vital signs very closely, reevaluate pain scale regularly, and supplemental oxygen as needed. Discharge plan for 09/17/2022  Anemia of chronic disease: Patient's hemoglobin remained stable and consistent with her baseline.  There is no clinical indication for blood transfusion at this time.  Chronic pain syndrome: Continue home medications  Major depression: Stable.  Continue home medications.  No suicidal homicidal intentions today.  Code Status: Full Code Family Communication: N/A Disposition Plan: Not yet ready for discharge  [place], MSN, FNP-C Patient [facility] [address] [place], [place] [address] [phone]  If 7PM-7AM, please contact night-coverage.  09/16/2022, 11:32 AM  LOS: 5 days

## 2022-09-17 DIAGNOSIS — D5700 Hb-SS disease with crisis, unspecified: Secondary | ICD-10-CM | POA: Diagnosis not present

## 2022-09-17 MED ORDER — MORPHINE SULFATE ER 60 MG PO TBCR: 60.0000 mg | EXTENDED_RELEASE_TABLET | Freq: Two times a day (BID) | ORAL | 0 refills | Status: DC

## 2022-09-17 MED ORDER — OXYCODONE HCL 10 MG PO TABS: 10.0000 mg | ORAL_TABLET | Freq: Four times a day (QID) | ORAL | 0 refills | Status: DC | PRN

## 2022-09-17 NOTE — Discharge Summary (Signed)
Physician Discharge Summary  Carmen [name] YQM:[id number] DOB: 18-Jul-1991 DOA: 09/11/2022  PCP: Carmen Dew, FNP  Admit date: 09/11/2022  Discharge date: 09/17/2022  Discharge Diagnoses:  Principal Problem:   Sickle cell crisis ([place]) Active Problems:   Anemia of chronic disease   Major depressive disorder   Chronic pain syndrome   Discharge Condition: Stable  Disposition:   Follow-up Information     [facility] Call.   Why: A referral has been made on your behalf. [facility] will call you to schedule an appointment. Contact information: [address], [place], [place] [address]  Phone: [phone]        Carmen Dew, FNP Follow up.   Specialty: Family Medicine Contact information: [place]. [place] [address] [phone]                Pt is discharged home in good condition and is to follow up with Carmen Dew, FNP this week to have labs evaluated. Carmen [name] is instructed to increase activity slowly and balance with rest for the next few days, and use prescribed medication to complete treatment of pain  Diet: Regular Wt Readings from Last 3 Encounters:  09/12/22 88.8 kg  08/24/22 87 kg  08/13/22 88.3 kg    History of present illness:  Carmen [name] is a 31 year old female with a medical history significant for sickle cell disease, vitamin D deficiencies, chronic pain syndrome, opiate dependence and tolerance, and depression presented to the emergency room with complaints of pain in back and legs.  Pain is typical of her sickle cell crisis despite use of home medications.  Her home medications include MS Contin and oxycodone.  Patient noted to have the symptoms for 1 week.  She denies any fever, chills, shortness of breath, nausea, vomiting, dysuria, or diarrhea.  ER course: Max temperature 99.1, BP 124/83, heart rate 80, RR 17, oxygen saturation 100%. Labs notable for: UA negative, WBCs  10.1, hemoglobin 11, platelets 509,000.  Sodium 137, potassium 3.6, chloride 107, creatinine 0.58.  Pain persists despite IV Dilaudid x2 and IV fluids.  Patient admitted for further management of pain crisis.  Hospital Course:  Sickle cell disease with pain crisis: Patient was admitted for sickle cell pain crisis and managed appropriately with IVF, IV Dilaudid via PCA and IV Toradol, as well as other adjunct therapies per sickle cell pain management protocols.  Patient very slow to respond to pain management regimen.  Her home medications were resumed during admission.  Today, her pain intensity has improved to 5/10 and she is requesting discharge home. Patient will continue her home medications of MS Contin 60 mg #30 and oxycodone 10 mg every 6 hours #60 was sent to patient's pharmacy.  PDMP reviewed prior to prescribing opiate medications, no inconsistencies noted. Patient will follow-up with PCP as scheduled for medication management.  Reviewed laboratory values, consistent with patient's baseline.   Patient was therefore discharged home today in a hemodynamically stable condition.   Carmen [name] will follow-up with PCP within 1 week of this discharge. Carmen [name] was counseled extensively about nonpharmacologic means of pain management, patient verbalized understanding and was appreciative of  the care received during this admission.   We discussed the need for good hydration, monitoring of hydration status, avoidance of heat, cold, stress, and infection triggers. We discussed the need to be adherent with  home medications. Patient was reminded of the need to seek medical attention immediately if  any symptom of bleeding, anemia, or infection occurs.  Discharge Exam: Vitals:   09/17/22 0604 09/17/22 0814  BP: 114/84 (!) 126/93  Pulse: (!) 108 (!) 109  Resp: 20 18  Temp: 98.5 F (36.9 C) 98.7 F (37.1 C)  SpO2: 100% 97%   Vitals:   09/17/22 0103 09/17/22 0145 09/17/22 0604 09/17/22 0814  BP:  105/65  114/84 (!) 126/93  Pulse:  92 (!) 108 (!) 109  Resp: '10 18 20 18  '$ Temp:  98.3 F (36.8 C) 98.5 F (36.9 C) 98.7 F (37.1 C)  TempSrc:  Oral Oral Oral  SpO2: 100% 97% 100% 97%  Weight:      Height:        General appearance : Awake, alert, not in any distress. Speech Clear. Not toxic looking HEENT: Atraumatic and Normocephalic, pupils equally reactive to light and accomodation Neck: Supple, no JVD. No cervical lymphadenopathy.  Chest: Good air entry bilaterally, no added sounds  CVS: S1 S2 regular, no murmurs.  Abdomen: Bowel sounds present, Non tender and not distended with no gaurding, rigidity or rebound. Extremities: B/L Lower Ext shows no edema, both legs are warm to touch Neurology: Awake alert, and oriented X 3, CN II-XII intact, Non focal Skin: No Rash  Discharge Instructions  Discharge Instructions     Discharge patient   Complete by: As directed    Discharge disposition: 01-Home or Self Care   Discharge patient date: 09/17/2022      Allergies as of 09/17/2022       Reactions   Ketamine Hives, Nausea And Vomiting, Other (See Comments)   Makes the patient "feel funny" also        Medication List     TAKE these medications    betamethasone valerate ointment 0.1 % Commonly known as: VALISONE APPLY TO AFFECTED AREA TWICE A DAY   cyclobenzaprine 10 MG tablet Commonly known as: FLEXERIL TAKE 1 TABLET BY MOUTH DAILY AS NEEDED FOR MUSCLE SPASMS. What changed:  how much to take how to take this when to take this reasons to take this   DULoxetine 30 MG capsule Commonly known as: CYMBALTA Take 2 capsules (60 mg total) by mouth daily.   folic acid 1 MG tablet Commonly known as: FOLVITE Take 1 tablet (1 mg total) by mouth daily.   morphine 60 MG 12 hr tablet Commonly known as: MS CONTIN Take 1 tablet (60 mg total) by mouth every 12 (twelve) hours. Start taking on: September 18, 2022   Oxycodone HCl 10 MG Tabs Take 1 tablet (10 mg total) by mouth  every 6 (six) hours as needed (pain).   Vitamin D (Ergocalciferol) 1.25 MG (50000 UNIT) Caps capsule Commonly known as: DRISDOL TAKE ONE CAPSULE BY MOUTH ONE TIME PER WEEK        The results of significant diagnostics from this hospitalization (including imaging, microbiology, ancillary and laboratory) are listed below for reference.    Significant Diagnostic Studies: DG Chest 2 View  Result Date: 09/12/2022 CLINICAL DATA:  Sickle cell crisis. EXAM: CHEST - 2 VIEW COMPARISON:  08/13/2022 FINDINGS: Cardiac silhouette is normal in size. Normal mediastinal and hilar contours. Clear lungs.  No pleural effusion or pneumothorax. Skeletal structures are intact. IMPRESSION: No active cardiopulmonary disease. Electronically Signed   By: Lajean Manes M.D.   On: 09/12/2022 10:06    Microbiology: No results found for this or any previous visit (from the past 240 hour(s)).   Labs: Basic Metabolic Panel: Recent Labs  Lab  09/11/22 1642 09/11/22 2240  NA 137  --   K 3.6  --   CL 107  --   CO2 24  --   GLUCOSE 84  --   BUN 12  --   CREATININE 0.58 0.54  CALCIUM 10.0  --    Liver Function Tests: Recent Labs  Lab 09/11/22 1642  AST 24  ALT 25  ALKPHOS 66  BILITOT 1.3*  PROT 8.9*  ALBUMIN 4.3   No results for input(s): "LIPASE", "AMYLASE" in the last 168 hours. No results for input(s): "AMMONIA" in the last 168 hours. CBC: Recent Labs  Lab 09/11/22 1642 09/11/22 2240 09/13/22 0535 09/16/22 0953  WBC 10.6* 10.4 12.0* 12.4*  NEUTROABS 5.3  --  5.9 5.7  HGB 11.0* 10.5* 9.8* 8.3*  HCT 31.0* 29.8* 27.1* 23.7*  MCV 82.9 83.0 81.4 82.6  PLT 509* 439* 352 385   Cardiac Enzymes: No results for input(s): "CKTOTAL", "CKMB", "CKMBINDEX", "TROPONINI" in the last 168 hours. BNP: Invalid input(s): "POCBNP" CBG: No results for input(s): "GLUCAP" in the last 168 hours.  Time coordinating discharge: 30 minutes  Signed:  [name]  APRN, MSN, FNP-C [facility] [address] [place], [place] [address] [phone]  [facility] 09/17/2022, 11:45 AM

## 2022-09-28 ENCOUNTER — Other Ambulatory Visit: Payer: Self-pay | Admitting: Family Medicine

## 2022-09-28 ENCOUNTER — Other Ambulatory Visit: Payer: Self-pay

## 2022-09-28 DIAGNOSIS — D571 Sickle-cell disease without crisis: Secondary | ICD-10-CM

## 2022-09-28 DIAGNOSIS — Z79891 Long term (current) use of opiate analgesic: Secondary | ICD-10-CM

## 2022-09-28 MED ORDER — OXYCODONE HCL 10 MG PO TABS: 10.0000 mg | ORAL_TABLET | Freq: Four times a day (QID) | ORAL | 0 refills | Status: DC | PRN

## 2022-09-28 NOTE — Progress Notes (Signed)
Reviewed PDMP substance reporting system prior to prescribing opiate medications. No inconsistencies noted.  Meds ordered this encounter  Medications   Oxycodone HCl 10 MG TABS    Sig: Take 1 tablet (10 mg total) by mouth every 6 (six) hours as needed (pain).    Dispense:  60 tablet    Refill:  0    Order Specific Question:   Supervising Provider    Answer:   [name], [name] [[id number]]   Delontae Lamm Moore Khyron Garno  APRN, MSN, FNP-C [facility] [address]  [place], [place] [address] [phone]  

## 2022-10-11 ENCOUNTER — Other Ambulatory Visit: Payer: Self-pay

## 2022-10-11 ENCOUNTER — Telehealth: Payer: Self-pay

## 2022-10-11 NOTE — Telephone Encounter (Signed)
Oxycodone prior authorization submitted to insurance via nctracks confirmation [phone] [id number]

## 2022-10-12 ENCOUNTER — Other Ambulatory Visit: Payer: Self-pay

## 2022-10-12 NOTE — Telephone Encounter (Signed)
Approved until 01/09/2023

## 2022-10-15 ENCOUNTER — Other Ambulatory Visit: Payer: Self-pay | Admitting: Family Medicine

## 2022-10-15 ENCOUNTER — Other Ambulatory Visit: Payer: Self-pay

## 2022-10-15 DIAGNOSIS — D571 Sickle-cell disease without crisis: Secondary | ICD-10-CM

## 2022-10-15 DIAGNOSIS — Z79891 Long term (current) use of opiate analgesic: Secondary | ICD-10-CM

## 2022-10-15 MED ORDER — MORPHINE SULFATE ER 60 MG PO TBCR: 60.0000 mg | EXTENDED_RELEASE_TABLET | Freq: Two times a day (BID) | ORAL | 0 refills | Status: DC

## 2022-10-15 NOTE — Telephone Encounter (Signed)
Called pt to have her to find another pharmacy that has her oxy medication. No answer and could not  lvm

## 2022-10-15 NOTE — Telephone Encounter (Signed)
From: [name] To: Office of [name], [place] Sent: 10/14/2022 5:09 PM EST Subject: Medication Renewal Request  Refills have been requested for the following medications:   morphine (MS CONTIN) 60 MG 12 hr tablet [Lachina Hollis]  Preferred pharmacy: [id number] [place], [place] - [address] [place]: [employer]

## 2022-10-15 NOTE — Progress Notes (Signed)
Reviewed PDMP substance reporting system prior to prescribing opiate medications. No inconsistencies noted.  Meds ordered this encounter  Medications   morphine (MS CONTIN) 60 MG 12 hr tablet    Sig: Take 1 tablet (60 mg total) by mouth every 12 (twelve) hours.    Dispense:  60 tablet    Refill:  0    Order Specific Question:   Supervising Provider    Answer:   Tresa Garter [[id number]]   [name]  APRN, MSN, FNP-C [facility] [address] [place], [place] [address] [phone]

## 2022-10-18 ENCOUNTER — Other Ambulatory Visit: Payer: Self-pay | Admitting: Family Medicine

## 2022-10-18 DIAGNOSIS — M545 Low back pain, unspecified: Secondary | ICD-10-CM

## 2022-10-19 NOTE — Telephone Encounter (Signed)
Please advise KH 

## 2022-11-02 ENCOUNTER — Other Ambulatory Visit: Payer: Self-pay | Admitting: Family Medicine

## 2022-11-02 DIAGNOSIS — D571 Sickle-cell disease without crisis: Secondary | ICD-10-CM

## 2022-11-02 DIAGNOSIS — Z79891 Long term (current) use of opiate analgesic: Secondary | ICD-10-CM

## 2022-11-02 MED ORDER — OXYCODONE HCL 10 MG PO TABS: 10.0000 mg | ORAL_TABLET | Freq: Four times a day (QID) | ORAL | 0 refills | Status: DC | PRN

## 2022-11-02 NOTE — Telephone Encounter (Signed)
Reviewed PDMP substance reporting system prior to prescribing opiate medications. No inconsistencies noted.  Meds ordered this encounter  Medications   Oxycodone HCl 10 MG TABS    Sig: Take 1 tablet (10 mg total) by mouth every 6 (six) hours as needed (pain).    Dispense:  60 tablet    Refill:  0    Order Specific Question:   Supervising Provider    Answer:   [name], [name] [[id number]]   Carmen [name] Carmen Milner  APRN, MSN, FNP-C [facility] [address]  , [place] [address] [phone]  

## 2022-11-02 NOTE — Telephone Encounter (Signed)
From: [name] To: Office of [name],  Sent: 11/02/2022 10:44 AM EST Subject: Medication Renewal Request  Refills have been requested for the following medications:   Oxycodone HCl 10 MG TABS [Benoit Meech]  Patient Comment: I have been requesting refill for my prescription for 2 weeks now when it's supposed to be filled every 15days. It's almost been a month   Preferred pharmacy: [id number] [place], [place] - [address]: [employer]

## 2022-11-14 ENCOUNTER — Encounter (HOSPITAL_COMMUNITY): Payer: Self-pay | Admitting: Emergency Medicine

## 2022-11-14 ENCOUNTER — Other Ambulatory Visit: Payer: Self-pay

## 2022-11-14 ENCOUNTER — Inpatient Hospital Stay (HOSPITAL_COMMUNITY)
Admission: EM | Admit: 2022-11-14 | Discharge: 2022-11-18 | DRG: 812 | Disposition: A | Discharge status: Home / Self Care | Payer: Medicaid Other | Attending: Internal Medicine | Admitting: Internal Medicine

## 2022-11-14 DIAGNOSIS — Z79891 Long term (current) use of opiate analgesic: Secondary | ICD-10-CM

## 2022-11-14 DIAGNOSIS — D571 Sickle-cell disease without crisis: Secondary | ICD-10-CM

## 2022-11-14 DIAGNOSIS — G894 Chronic pain syndrome: Secondary | ICD-10-CM | POA: Diagnosis present

## 2022-11-14 DIAGNOSIS — Z8261 Family history of arthritis: Secondary | ICD-10-CM

## 2022-11-14 DIAGNOSIS — Z8249 Family history of ischemic heart disease and other diseases of the circulatory system: Secondary | ICD-10-CM

## 2022-11-14 DIAGNOSIS — D638 Anemia in other chronic diseases classified elsewhere: Secondary | ICD-10-CM | POA: Diagnosis not present

## 2022-11-14 DIAGNOSIS — Z888 Allergy status to other drugs, medicaments and biological substances status: Secondary | ICD-10-CM | POA: Diagnosis not present

## 2022-11-14 DIAGNOSIS — Z79899 Other long term (current) drug therapy: Secondary | ICD-10-CM | POA: Diagnosis not present

## 2022-11-14 DIAGNOSIS — Z832 Family history of diseases of the blood and blood-forming organs and certain disorders involving the immune mechanism: Secondary | ICD-10-CM

## 2022-11-14 DIAGNOSIS — Z841 Family history of disorders of kidney and ureter: Secondary | ICD-10-CM | POA: Diagnosis not present

## 2022-11-14 DIAGNOSIS — F32A Depression, unspecified: Secondary | ICD-10-CM | POA: Diagnosis present

## 2022-11-14 DIAGNOSIS — D5700 Hb-SS disease with crisis, unspecified: Secondary | ICD-10-CM | POA: Diagnosis not present

## 2022-11-14 DIAGNOSIS — F419 Anxiety disorder, unspecified: Secondary | ICD-10-CM | POA: Diagnosis present

## 2022-11-14 DIAGNOSIS — M62838 Other muscle spasm: Secondary | ICD-10-CM | POA: Diagnosis present

## 2022-11-14 DIAGNOSIS — F1120 Opioid dependence, uncomplicated: Secondary | ICD-10-CM | POA: Diagnosis present

## 2022-11-14 DIAGNOSIS — M545 Low back pain, unspecified: Secondary | ICD-10-CM

## 2022-11-14 DIAGNOSIS — F17210 Nicotine dependence, cigarettes, uncomplicated: Secondary | ICD-10-CM | POA: Diagnosis present

## 2022-11-14 LAB — CBC WITH DIFFERENTIAL/PLATELET
Abs Immature Granulocytes: 0.04 10*3/uL (ref 0.00–0.07)
Basophils Absolute: 0 10*3/uL (ref 0.0–0.1)
Basophils Relative: 0 %
Eosinophils Absolute: 0.1 10*3/uL (ref 0.0–0.5)
Eosinophils Relative: 1 %
HCT: 29.9 % — ABNORMAL LOW (ref 36.0–46.0)
Hemoglobin: 10.5 g/dL — ABNORMAL LOW (ref 12.0–15.0)
Immature Granulocytes: 0 %
Lymphocytes Relative: 36 %
Lymphs Abs: 3.3 10*3/uL (ref 0.7–4.0)
MCH: 29.2 pg (ref 26.0–34.0)
MCHC: 35.1 g/dL (ref 30.0–36.0)
MCV: 83.3 fL (ref 80.0–100.0)
Monocytes Absolute: 0.8 10*3/uL (ref 0.1–1.0)
Monocytes Relative: 9 %
Neutro Abs: 4.9 10*3/uL (ref 1.7–7.7)
Neutrophils Relative %: 54 %
Platelets: 531 10*3/uL — ABNORMAL HIGH (ref 150–400)
RBC: 3.59 MIL/uL — ABNORMAL LOW (ref 3.87–5.11)
RDW: 13 % (ref 11.5–15.5)
WBC: 9.2 10*3/uL (ref 4.0–10.5)
nRBC: 0.5 % — ABNORMAL HIGH (ref 0.0–0.2)

## 2022-11-14 LAB — COMPREHENSIVE METABOLIC PANEL
ALT: 21 U/L (ref 0–44)
AST: 23 U/L (ref 15–41)
Albumin: 3.9 g/dL (ref 3.5–5.0)
Alkaline Phosphatase: 60 U/L (ref 38–126)
Anion gap: 9 (ref 5–15)
BUN: 7 mg/dL (ref 6–20)
CO2: 24 mmol/L (ref 22–32)
Calcium: 9.2 mg/dL (ref 8.9–10.3)
Chloride: 106 mmol/L (ref 98–111)
Creatinine, Ser: 0.66 mg/dL (ref 0.44–1.00)
GFR, Estimated: >60 mL/min (ref >60)
Glucose, Bld: 95 mg/dL (ref 70–99)
Potassium: 4 mmol/L (ref 3.5–5.1)
Sodium: 139 mmol/L (ref 135–145)
Total Bilirubin: 0.9 mg/dL (ref 0.3–1.2)
Total Protein: 7.3 g/dL (ref 6.5–8.1)

## 2022-11-14 LAB — RETICULOCYTES
Immature Retic Fract: 32.7 % — ABNORMAL HIGH (ref 2.3–15.9)
RBC.: 3.54 MIL/uL — ABNORMAL LOW (ref 3.87–5.11)
Retic Count, Absolute: 166 10*3/uL (ref 19.0–186.0)
Retic Ct Pct: 4.7 % — ABNORMAL HIGH (ref 0.4–3.1)

## 2022-11-14 MED ORDER — HYDROMORPHONE HCL 2 MG/ML IJ SOLN
2.0000 mg | INTRAMUSCULAR | Status: AC
  Administered 2022-11-14: 2 mg via SUBCUTANEOUS
  Filled 2022-11-14: qty 1

## 2022-11-14 MED ORDER — SENNOSIDES-DOCUSATE SODIUM 8.6-50 MG PO TABS
1.0000 | ORAL_TABLET | Freq: Two times a day (BID) | ORAL | Status: DC
  Administered 2022-11-14 – 2022-11-18 (×8): 1 via ORAL
  Filled 2022-11-14 (×8): qty 1

## 2022-11-14 MED ORDER — DEXTROSE-NACL 5-0.45 % IV SOLN: INTRAVENOUS | Status: DC

## 2022-11-14 MED ORDER — DULOXETINE HCL 30 MG PO CPEP
30.0000 mg | ORAL_CAPSULE | Freq: Two times a day (BID) | ORAL | Status: DC
  Administered 2022-11-14 – 2022-11-18 (×8): 30 mg via ORAL
  Filled 2022-11-14 (×8): qty 1

## 2022-11-14 MED ORDER — HYDROMORPHONE 1 MG/ML IV SOLN
INTRAVENOUS | Status: DC
  Administered 2022-11-14 – 2022-11-15 (×2): 30 mg via INTRAVENOUS
  Administered 2022-11-15: 6 mg via INTRAVENOUS
  Administered 2022-11-15: 12 mg via INTRAVENOUS
  Administered 2022-11-15: 0.5 mg via INTRAVENOUS
  Administered 2022-11-15: 13.5 mg via INTRAVENOUS
  Administered 2022-11-16: 7.5 mg via INTRAVENOUS
  Administered 2022-11-16: 6 mg via INTRAVENOUS
  Administered 2022-11-16: 30 mg via INTRAVENOUS
  Administered 2022-11-16: 9 mg via INTRAVENOUS
  Administered 2022-11-16: 7.5 mg via INTRAVENOUS
  Administered 2022-11-16: 7 mg via INTRAVENOUS
  Administered 2022-11-16: 12 mg via INTRAVENOUS
  Administered 2022-11-16: 5 mg via INTRAVENOUS
  Administered 2022-11-17: 3.5 mg via INTRAVENOUS
  Administered 2022-11-17: 5 mg via INTRAVENOUS
  Administered 2022-11-17: 5.5 mg via INTRAVENOUS
  Administered 2022-11-17: 30 mg via INTRAVENOUS
  Filled 2022-11-14 (×4): qty 30

## 2022-11-14 MED ORDER — MORPHINE SULFATE ER 15 MG PO TBCR
60.0000 mg | EXTENDED_RELEASE_TABLET | Freq: Two times a day (BID) | ORAL | Status: DC
  Administered 2022-11-14 – 2022-11-18 (×8): 60 mg via ORAL
  Filled 2022-11-14 (×8): qty 4

## 2022-11-14 MED ORDER — NALOXONE HCL 0.4 MG/ML IJ SOLN: 0.4000 mg | INTRAMUSCULAR | Status: DC | PRN

## 2022-11-14 MED ORDER — KETOROLAC TROMETHAMINE 15 MG/ML IJ SOLN
15.0000 mg | Freq: Four times a day (QID) | INTRAMUSCULAR | Status: DC
  Administered 2022-11-14 – 2022-11-18 (×13): 15 mg via INTRAVENOUS
  Filled 2022-11-14 (×13): qty 1

## 2022-11-14 MED ORDER — DIPHENHYDRAMINE HCL 25 MG PO CAPS
25.0000 mg | ORAL_CAPSULE | ORAL | Status: DC | PRN
  Administered 2022-11-14: 25 mg via ORAL
  Administered 2022-11-15 – 2022-11-16 (×2): 50 mg via ORAL
  Filled 2022-11-14 (×2): qty 2
  Filled 2022-11-14: qty 1

## 2022-11-14 MED ORDER — POLYETHYLENE GLYCOL 3350 17 G PO PACK: 17.0000 g | PACK | Freq: Every day | ORAL | Status: DC | PRN

## 2022-11-14 MED ORDER — ENOXAPARIN SODIUM 40 MG/0.4ML IJ SOSY
40.0000 mg | PREFILLED_SYRINGE | INTRAMUSCULAR | Status: DC
  Filled 2022-11-14 (×2): qty 0.4

## 2022-11-14 MED ORDER — ONDANSETRON HCL 4 MG/2ML IJ SOLN
4.0000 mg | Freq: Four times a day (QID) | INTRAMUSCULAR | Status: DC | PRN
  Administered 2022-11-15: 4 mg via INTRAVENOUS
  Filled 2022-11-14: qty 2

## 2022-11-14 MED ORDER — OXYCODONE HCL 5 MG PO TABS
10.0000 mg | ORAL_TABLET | Freq: Once | ORAL | Status: AC
  Administered 2022-11-14: 10 mg via ORAL
  Filled 2022-11-14: qty 2

## 2022-11-14 MED ORDER — DIPHENHYDRAMINE HCL 25 MG PO CAPS: 25.0000 mg | ORAL_CAPSULE | ORAL | Status: DC | PRN

## 2022-11-14 MED ORDER — FOLIC ACID 1 MG PO TABS
1.0000 mg | ORAL_TABLET | Freq: Every day | ORAL | Status: DC
  Administered 2022-11-15 – 2022-11-18 (×4): 1 mg via ORAL
  Filled 2022-11-14 (×4): qty 1

## 2022-11-14 MED ORDER — SODIUM CHLORIDE 0.9% FLUSH: 9.0000 mL | INTRAVENOUS | Status: DC | PRN

## 2022-11-14 MED ORDER — HYDROMORPHONE HCL 1 MG/ML IJ SOLN
1.0000 mg | INTRAMUSCULAR | Status: DC | PRN
  Administered 2022-11-14 (×2): 1 mg via INTRAVENOUS
  Filled 2022-11-14 (×2): qty 1

## 2022-11-14 MED ORDER — VITAMIN D (ERGOCALCIFEROL) 1.25 MG (50000 UNIT) PO CAPS: 50000.0000 [IU] | ORAL_CAPSULE | ORAL | Status: DC

## 2022-11-14 MED ORDER — CYCLOBENZAPRINE HCL 10 MG PO TABS
10.0000 mg | ORAL_TABLET | Freq: Every day | ORAL | Status: DC | PRN
  Administered 2022-11-15: 10 mg via ORAL
  Filled 2022-11-14: qty 1

## 2022-11-14 NOTE — ED Notes (Signed)
ED TO INPATIENT HANDOFF REPORT  ED Nurse Name and Phone #: Luz Brazen [phone]  S Name/Age/Gender Carmen [name] 32 y.o. female Room/Bed: WA19/WA19  Code Status   Code Status: Full Code  Home/SNF/Other Home Patient oriented to: self, place, time, and situation Is this baseline? Yes   Triage Complete: Triage complete  Chief Complaint Sickle cell crisis ([place]) [D57.00]  Triage Note 32 yo c/o sickle cell crisis, right sided back pain x 3 days. Pt reports she has visited the pain clinic pain is still unresolved. Pt states she took 10 mg of morphine around 1 am but it has not helped. Pt reports no associated symptoms at this time.    Allergies Allergies  Allergen Reactions   Ketamine Hives, Nausea And Vomiting and Other (See Comments)    Makes the patient "feel funny" also    Level of Care/Admitting Diagnosis ED Disposition     ED Disposition  Admit   Condition  --   [place]: [facility] [[id number]]  Level of Care: Med-Surg [16]  May admit patient to [facility] or [facility] if equivalent level of care is available:: No  Covid Evaluation: Asymptomatic - no recent exposure (last 10 days) testing not required  Diagnosis: Sickle cell crisis [facility]) [[id number]]  Admitting Physician: Elwyn Reach [[id number]]  Attending Physician: Elwyn Reach [[id number]]  Certification:: I certify this patient will need inpatient services for at least 2 midnights  Estimated Length of Stay: 4          B Medical/Surgery History Past Medical History:  Diagnosis Date   Anxiety    Headache(784.0)    Heart murmur    Sickle cell crisis ([place])    Syphilis 2015   Was diagnosed and received one injection of antibiotics   Thrombocytosis 11/22/2014     CBC Latest Ref Rng & Units 12/13/2018 12/11/2018 12/10/2018 WBC 4.0 - 10.5 K/uL 14.8(H) 13.2(H) 15.9(H) Hemoglobin 12.0 - 15.0 g/dL 8.2(L) 7.7(L) 8.1(L) Hematocrit 36.0 - 46.0 % 23.7(L) 23.2(L) 24.5(L) Platelets 150 - 400  K/uL 326 399 388     Past Surgical History:  Procedure Laterality Date   CESAREAN SECTION N/A 02/05/2019   Procedure: CESAREAN SECTION;  Surgeon: [name], MD;  Location: [facility] LD ORS;  Service: Obstetrics;  Laterality: N/A;   CHOLECYSTECTOMY N/A 11/30/2014   Procedure: LAPAROSCOPIC CHOLECYSTECTOMY SINGLE SITE WITH INTRAOPERATIVE CHOLANGIOGRAM;  Surgeon: Michael Boston, MD;  Location: [facility] ORS;  Service: General;  Laterality: N/A;   SPLENECTOMY       A IV Location/Drains/Wounds Patient Lines/Drains/Airways Status     Active Line/Drains/Airways     Name Placement date Placement time Site Days   Peripheral IV 11/14/22 20 G Anterior;Right;Upper Arm 11/14/22  1735  Arm  less than 1            Intake/Output Last 24 hours No intake or output data in the 24 hours ending 11/14/22 2131  Labs/Imaging Results for orders placed or performed during the hospital encounter of 11/14/22 (from the past 48 hour(s))  Comprehensive metabolic panel     Status: None   Collection Time: 11/14/22  2:35 PM  Result Value Ref Range   Sodium 139 135 - 145 mmol/L   Potassium 4.0 3.5 - 5.1 mmol/L   Chloride 106 98 - 111 mmol/L   CO2 24 22 - 32 mmol/L   Glucose, Bld 95 70 - 99 mg/dL    Comment: Glucose reference range applies only to samples taken  after fasting for at least 8 hours.   BUN 7 6 - 20 mg/dL   Creatinine, Ser 0.66 0.44 - 1.00 mg/dL   Calcium 9.2 8.9 - 10.3 mg/dL   Total Protein 7.3 6.5 - 8.1 g/dL   Albumin 3.9 3.5 - 5.0 g/dL   AST 23 15 - 41 U/L   ALT 21 0 - 44 U/L   Alkaline Phosphatase 60 38 - 126 U/L   Total Bilirubin 0.9 0.3 - 1.2 mg/dL   GFR, Estimated >60 >60 mL/min    Comment: (NOTE) Calculated using the CKD-EPI Creatinine Equation (2021)    Anion gap 9 5 - 15    Comment: Performed at [facility], [place] [address] [place], [place] [address]  CBC with Differential     Status: Abnormal   Collection Time: 11/14/22  2:35 PM  Result Value Ref Range   WBC 9.2  4.0 - 10.5 K/uL   RBC 3.59 (L) 3.87 - 5.11 MIL/uL   Hemoglobin 10.5 (L) 12.0 - 15.0 g/dL   HCT 29.9 (L) 36.0 - 46.0 %   MCV 83.3 80.0 - 100.0 fL   MCH 29.2 26.0 - 34.0 pg   MCHC 35.1 30.0 - 36.0 g/dL   RDW 13.0 11.5 - 15.5 %   Platelets 531 (H) 150 - 400 K/uL   nRBC 0.5 (H) 0.0 - 0.2 %   Neutrophils Relative % 54 %   Neutro Abs 4.9 1.7 - 7.7 K/uL   Lymphocytes Relative 36 %   Lymphs Abs 3.3 0.7 - 4.0 K/uL   Monocytes Relative 9 %   Monocytes Absolute 0.8 0.1 - 1.0 K/uL   Eosinophils Relative 1 %   Eosinophils Absolute 0.1 0.0 - 0.5 K/uL   Basophils Relative 0 %   Basophils Absolute 0.0 0.0 - 0.1 K/uL   Immature Granulocytes 0 %   Abs Immature Granulocytes 0.04 0.00 - 0.07 K/uL    Comment: Performed at [facility], [place] [address], [place], [place] [address]  Reticulocytes     Status: Abnormal   Collection Time: 11/14/22  2:35 PM  Result Value Ref Range   Retic Ct Pct 4.7 (H) 0.4 - 3.1 %   RBC. 3.54 (L) 3.87 - 5.11 MIL/uL   Retic Count, Absolute 166.0 19.0 - 186.0 K/uL   Immature Retic Fract 32.7 (H) 2.3 - 15.9 %    Comment: Performed at [facility], [place] [address], [place],  [address]   No results found.  Pending Labs [employer] (From admission, onward)     Start     Ordered   Signed and Held  CBC  (enoxaparin (LOVENOX)    CrCl >/= 30 ml/min)  Once,   R       Comments: Baseline for enoxaparin therapy IF NOT ALREADY DRAWN.  Notify MD if PLT < 100 K.    Signed and Held   Signed and Held  Creatinine, serum  (enoxaparin (LOVENOX)    CrCl >/= 30 ml/min)  Once,   R       Comments: Baseline for enoxaparin therapy IF NOT ALREADY DRAWN.    Signed and Held   Signed and Held  Creatinine, serum  (enoxaparin (LOVENOX)    CrCl >/= 30 ml/min)  Weekly,   R     Comments: while on enoxaparin therapy    Signed and Held   Signed and Held  Comprehensive metabolic panel  Tomorrow morning,   R  Signed and Held   Signed and Held   CBC with Differential/Platelet  Tomorrow morning,   R        Signed and Held            Vitals/Pain Today's Vitals   11/14/22 1532 11/14/22 1711 11/14/22 1900 11/14/22 1916  BP: 101/64 113/67 (!) 148/87 113/60  Pulse: 78 64 100 88  Resp: '18 16  18  '$ Temp:  98.6 F (37 C)  98.8 F (37.1 C)  TempSrc:  Oral  Oral  SpO2: 99% 100% 99% 95%  Weight:      Height:      PainSc: 10-Worst pain ever       Isolation Precautions No active isolations  Medications Medications  diphenhydrAMINE (BENADRYL) capsule 25-50 mg (25 mg Oral Given 11/14/22 1147)  HYDROmorphone (DILAUDID) injection 1 mg (1 mg Intravenous Given 11/14/22 2126)  HYDROmorphone (DILAUDID) injection 2 mg (2 mg Subcutaneous Given 11/14/22 1148)  HYDROmorphone (DILAUDID) injection 2 mg (2 mg Subcutaneous Given 11/14/22 1236)  HYDROmorphone (DILAUDID) injection 2 mg (2 mg Subcutaneous Given 11/14/22 1345)  oxyCODONE (Oxy IR/ROXICODONE) immediate release tablet 10 mg (10 mg Oral Given 11/14/22 1529)    Mobility walks Low fall risk   Focused Assessments    R Recommendations: See Admitting Provider Note  Report given to:   Additional Notes:

## 2022-11-14 NOTE — ED Triage Notes (Signed)
32 yo c/o sickle cell crisis, right sided back pain x 3 days. Pt reports she has visited the pain clinic pain is still unresolved. Pt states she took 10 mg of morphine around 1 am but it has not helped. Pt reports no associated symptoms at this time.

## 2022-11-14 NOTE — Progress Notes (Signed)
A STAT consult was placed to the hospital's IV Nurse for iv access/ lab work;  attempted x 1 in the [place] with ultrasound; unable to thread catheter; no other healthy, suitable veins noted; pt stated "I had a picc about a year ago."

## 2022-11-14 NOTE — ED Provider Notes (Signed)
[facility] DEPT Provider Note   CSN: [id number] Arrival date & time: 11/14/22  6659     History  Chief Complaint  Patient presents with   Sickle Cell Pain Crisis    Carmen [name] is a 32 y.o. female.   Sickle Cell Pain Crisis    Patient presents to the emergency department due to pain.  The pain is to the upper back, left arm and left leg but there is no lateralized weakness or numbness.  Denies any chest pain, shortness of breath, fevers, chills.  She is taking her home medicine with no missed doses.  States the trigger was the recent decline in temperature outside.   Home Medications Prior to Admission medications   Medication Sig Start Date End Date Taking? Authorizing Provider  cyclobenzaprine (FLEXERIL) 10 MG tablet TAKE 1 TABLET BY MOUTH DAILY AS NEEDED FOR MUSCLE SPASMS. Patient taking differently: Take 10 mg by mouth daily as needed for muscle spasms. TAKE 1 TABLET BY MOUTH DAILY AS NEEDED FOR MUSCLE SPASMS. 10/19/22  Yes [name], FNP  DULoxetine (CYMBALTA) 30 MG capsule Take 2 capsules (60 mg total) by mouth daily. Patient taking differently: Take 30 mg by mouth 2 (two) times daily. 08/24/22 02/20/23 Yes [name], FNP  folic acid (FOLVITE) 1 MG tablet Take 1 tablet (1 mg total) by mouth daily. 03/15/19  Yes Lanae Boast, FNP  morphine (MS CONTIN) 60 MG 12 hr tablet Take 1 tablet (60 mg total) by mouth every 12 (twelve) hours. 10/18/22  Yes [name], FNP  Oxycodone HCl 10 MG TABS Take 1 tablet (10 mg total) by mouth every 6 (six) hours as needed (pain). 11/02/22  Yes [name], FNP  Vitamin D, Ergocalciferol, (DRISDOL) 1.25 MG (50000 UNIT) CAPS capsule TAKE ONE CAPSULE BY MOUTH ONE TIME PER WEEK Patient taking differently: Take 50,000 Units by mouth every 7 (seven) days. saturdays 08/24/22  Yes [name], FNP  betamethasone valerate ointment (VALISONE) 0.1 % APPLY TO AFFECTED AREA TWICE A  DAY Patient not taking: Reported on 05/10/2022 04/07/22   [name], FNP      Allergies    Ketamine    Review of Systems   Review of Systems  Physical Exam Updated Vital Signs BP 113/67 (BP Location: Left Arm)   Pulse 64   Temp 98.6 F (37 C) (Oral)   Resp 16   Ht '5\' 3"'$  (1.6 m)   Wt 86.2 kg   SpO2 100%   BMI 33.66 kg/m  Physical Exam Vitals and nursing note reviewed. Exam conducted with a chaperone present.  Constitutional:      Appearance: Normal appearance.  HENT:     Head: Normocephalic and atraumatic.  Eyes:     General: No scleral icterus.       Right eye: No discharge.        Left eye: No discharge.     Extraocular Movements: Extraocular movements intact.     Pupils: Pupils are equal, round, and reactive to light.  Cardiovascular:     Rate and Rhythm: Normal rate and regular rhythm.     Pulses: Normal pulses.     Heart sounds: Murmur heard.     No friction rub. No gallop.  Pulmonary:     Effort: Pulmonary effort is normal. No respiratory distress.     Breath sounds: Normal breath sounds.  Abdominal:     General: Abdomen is flat. Bowel sounds are normal. There is  no distension.     Palpations: Abdomen is soft.     Tenderness: There is no abdominal tenderness.  Skin:    General: Skin is warm and dry.     Coloration: Skin is not jaundiced.  Neurological:     Mental Status: She is alert. Mental status is at baseline.     Coordination: Coordination normal.     ED Results / Procedures / Treatments   Labs (all labs ordered are listed, but only abnormal results are displayed) Labs Reviewed  CBC WITH DIFFERENTIAL/PLATELET - Abnormal; Notable for the following components:      Result Value   RBC 3.59 (*)    Hemoglobin 10.5 (*)    HCT 29.9 (*)    Platelets 531 (*)    nRBC 0.5 (*)    All other components within normal limits  RETICULOCYTES - Abnormal; Notable for the following components:   Retic Ct Pct 4.7 (*)    RBC. 3.54 (*)    Immature Retic  Fract 32.7 (*)    All other components within normal limits  COMPREHENSIVE METABOLIC PANEL  I-STAT BETA HCG BLOOD, ED ([facility], [facility], [facility] ONLY)    EKG None  Radiology No results found.  Procedures Procedures    Medications Ordered in ED Medications  diphenhydrAMINE (BENADRYL) capsule 25-50 mg (25 mg Oral Given 11/14/22 1147)  HYDROmorphone (DILAUDID) injection 2 mg (2 mg Subcutaneous Given 11/14/22 1148)  HYDROmorphone (DILAUDID) injection 2 mg (2 mg Subcutaneous Given 11/14/22 1236)  HYDROmorphone (DILAUDID) injection 2 mg (2 mg Subcutaneous Given 11/14/22 1345)  oxyCODONE (Oxy IR/ROXICODONE) immediate release tablet 10 mg (10 mg Oral Given 11/14/22 1529)    ED Course/ Medical Decision Making/ A&P                             Medical Decision Making Amount and/or Complexity of Data Reviewed Labs: ordered.  Risk Prescription drug management. Decision regarding hospitalization.   Patient with medical history of sickle cell anemia presents to the emergency department due to myalgias.  Differential includes but not limited to aplastic anemia, acute chest, close of crisis, metabolic abnormality.  Patient appears very uncomfortable although nontoxic and not hypoxic.  She is afebrile, no respiratory illness and she denies any chest pain so low suspicion for acute chest.  Does not appear dry or dehydrated.  Will start with subcu medicine as patient is a hard stick and we are awaiting IV team.  IV team is failed and been unsuccessful in obtaining access.  Will straight stick the patient for lab.  Patient's pain has not improved at all despite 3 subcu doses of the Dilaudid.  I suspect patient will need admission for pain control, she does appear very uncomfortable.  I ordered, viewed and interpreted laboratory workup. CBC without leukocytosis, anemic with hemoglobin 10.5 Reticulocyte count is elevated but not grossly so. CMP is no gross electrolyte derangement, AKI or transaminitis  I do  not suspect acute chest, is also no signs of aplastic anemia.  Not hypoxic vaso-occlusive crisis on my evaluation.  Consider sending the patient home but she despite 3 subcutaneous Dilaudid and IR oxycodone relief patient's pain is not controlled but still 10 out of 10.  I will speak with Dr. Jonelle Sidle with sickle cell clinic for recommendations but I suspect patient may need admission.  Spoke with Dr. Mcarthur Rossetti who agrees with admission.        Final Clinical Impression(s) / ED Diagnoses  Final diagnoses:  Sickle cell pain crisis [facility])    Rx / [place] Orders ED Discharge Orders     None         Sherrill Raring, Hershal Coria 11/14/22 [place], MD 11/15/22 1757

## 2022-11-14 NOTE — ED Provider Notes (Signed)
Blood pressure 101/64, pulse 78, temperature 98.7 F (37.1 C), temperature source Oral, resp. rate 18, height '5\' 3"'$  (1.6 m), weight 86.2 kg, SpO2 99 %.  Assuming care from Dr. Matilde Sprang.  In short, Carmen [name] is a 32 y.o. female with a chief complaint of Sickle Cell Pain Crisis .  Refer to the original H&P for additional details.  The current plan of care is to establish IV access and admit for sickle cell pain crisis.   Emergency Ultrasound Study:   Angiocath insertion Performed by: Margette Fast  Consent: Verbal consent obtained. Risks and benefits: risks, benefits and alternatives were discussed Immediately prior to procedure the correct patient, procedure, equipment, support staff and site/side marked as needed.  Indication: difficult IV access Preparation: Patient was prepped and draped in the usual sterile fashion. Vein Location: Right upper arm was visualized during assessment for potential access sites and was found to be patent/ easily compressed with linear ultrasound.  The needle was visualized with real-time ultrasound and guided into the vein. Gauge: 20 Donesha Wallander  Normal blood return.  Patient tolerance: Patient tolerated the procedure well with no immediate complications.       Margette Fast, MD 11/14/22 1650

## 2022-11-14 NOTE — Plan of Care (Signed)
Discuss and review plan of care with patient/family  

## 2022-11-14 NOTE — H&P (Signed)
History and Physical    Patient: Carmen [name] DOB: [birth year] DOA: 11/14/2022 DOS: the patient was seen and examined on 11/14/2022 PCP: [name], FNP  Patient coming from: Home  Chief Complaint:  Chief Complaint  Patient presents with   Sickle Cell Pain Crisis   HPI: Carmen [name] is a 32 y.o. female with medical history significant of sickle cell disease, anxiety disorder, chronic pain syndrome, thrombocytosis who presented to the ER with persistent pain in her back and legs.  Also chest pain.  Patient initially thought she may have pneumonia.  Patient was seen in the ER and evaluated.  She apparently has taken her home regimen for 2 days with no relief.  Was given treatment in the ER also no significant relief.  Patient appears to have typical sickle cell crisis and is being admitted to the hospital for further evaluation and treatment.  Review of Systems: As mentioned in the history of present illness. All other systems reviewed and are negative. Past Medical History:  Diagnosis Date   Anxiety    Headache(784.0)    Heart murmur    Sickle cell crisis ([place])    Syphilis 2015   Was diagnosed and received one injection of antibiotics   Thrombocytosis 11/22/2014     CBC Latest Ref Rng & Units 12/13/2018 12/11/2018 12/10/2018 WBC 4.0 - 10.5 K/uL 14.8(H) 13.2(H) 15.9(H) Hemoglobin 12.0 - 15.0 g/dL 8.2(L) 7.7(L) 8.1(L) Hematocrit 36.0 - 46.0 % 23.7(L) 23.2(L) 24.5(L) Platelets 150 - 400 K/uL 326 399 388     Past Surgical History:  Procedure Laterality Date   CESAREAN SECTION N/A 02/05/2019   Procedure: CESAREAN SECTION;  Surgeon: [name], MD;  Location: [facility] LD ORS;  Service: Obstetrics;  Laterality: N/A;   CHOLECYSTECTOMY N/A 11/30/2014   Procedure: LAPAROSCOPIC CHOLECYSTECTOMY SINGLE SITE WITH INTRAOPERATIVE CHOLANGIOGRAM;  Surgeon: Michael Boston, MD;  Location: [facility] ORS;  Service: General;  Laterality: N/A;   SPLENECTOMY     Social History:  reports that she has  been smoking cigarettes. She has never used smokeless tobacco. She reports that she does not drink alcohol and does not use drugs.  Allergies  Allergen Reactions   Ketamine Hives, Nausea And Vomiting and Other (See Comments)    Makes the patient "feel funny" also    Family History  Problem Relation Age of Onset   Hypertension Mother    Sickle cell anemia Sister    Kidney disease Sister        Lupus   Arthritis Sister    Sickle cell anemia Sister    Sickle cell trait Sister    Heart disease Maternal Aunt        CABG   Heart disease Maternal Uncle        CABG   Lupus Sister     Prior to Admission medications   Medication Sig Start Date End Date Taking? Authorizing Provider  cyclobenzaprine (FLEXERIL) 10 MG tablet TAKE 1 TABLET BY MOUTH DAILY AS NEEDED FOR MUSCLE SPASMS. Patient taking differently: Take 10 mg by mouth daily as needed for muscle spasms. TAKE 1 TABLET BY MOUTH DAILY AS NEEDED FOR MUSCLE SPASMS. 10/19/22  Yes [name], FNP  DULoxetine (CYMBALTA) 30 MG capsule Take 2 capsules (60 mg total) by mouth daily. Patient taking differently: Take 30 mg by mouth 2 (two) times daily. 08/24/22 02/20/23 Yes [name], FNP  folic acid (FOLVITE) 1 MG tablet Take 1 tablet (1 mg total) by mouth daily. 03/15/19  Yes Lanae Boast, FNP  morphine (MS CONTIN) 60 MG 12 hr tablet Take 1 tablet (60 mg total) by mouth every 12 (twelve) hours. 10/18/22  Yes [name], FNP  Oxycodone HCl 10 MG TABS Take 1 tablet (10 mg total) by mouth every 6 (six) hours as needed (pain). 11/02/22  Yes [name], FNP  Vitamin D, Ergocalciferol, (DRISDOL) 1.25 MG (50000 UNIT) CAPS capsule TAKE ONE CAPSULE BY MOUTH ONE TIME PER WEEK Patient taking differently: Take 50,000 Units by mouth every 7 (seven) days. saturdays 08/24/22  Yes [name], FNP  betamethasone valerate ointment (VALISONE) 0.1 % APPLY TO AFFECTED AREA TWICE A DAY Patient not taking: Reported on 05/10/2022 04/07/22    [name], FNP    Physical Exam: Vitals:   11/14/22 1311 11/14/22 1424 11/14/22 1532 11/14/22 1711  BP: 100/88 129/86 101/64 113/67  Pulse: 73 68 78 64  Resp: '16 16 18 16  '$ Temp: 98.7 F (37.1 C)   98.6 F (37 C)  TempSrc: Oral   Oral  SpO2: 100% 99% 99% 100%  Weight:      Height:       Constitutional: Acutely ill looking, no distress NAD, calm, comfortable Eyes: PERRL, lids and conjunctivae normal ENMT: Mucous membranes are moist. Posterior pharynx clear of any exudate or lesions.Normal dentition.  Neck: normal, supple, no masses, no thyromegaly Respiratory: clear to auscultation bilaterally, no wheezing, no crackles. Normal respiratory effort. No accessory muscle use.  Cardiovascular: Regular rate and rhythm, no murmurs / rubs / gallops. No extremity edema. 2+ pedal pulses. No carotid bruits.  Abdomen: no tenderness, no masses palpated. No hepatosplenomegaly. Bowel sounds positive.  Musculoskeletal: Good range of motion, no joint swelling or tenderness, Skin: no rashes, lesions, ulcers. No induration Neurologic: CN 2-12 grossly intact. Sensation intact, DTR normal. Strength 5/5 in all 4.  Psychiatric: Normal judgment and insight. Alert and oriented x 3. Normal mood  Data Reviewed:  Hemoglobin is 10.5.  White count 9.2 and platelets 531.  Chemistry all within normal.  Reticulocyte count is 4.7.  Assessment and Plan:  #1 sickle cell pain crisis: Patient will be admitted.  Initiate Dilaudid PCA, Toradol and 50 mg every 6, D5 half-normal saline, home OxyContin and frequent pain assessments.  #2 anemia of chronic disease: H&H appears to be stable.  Continue monitor  #3 chronic pain syndrome: Continue chronic OxyContin  #4 anxiety with depression: Continue with home regimen     Advance Care Planning:   Code Status: Prior full code  Consults: None  Family Communication: No family at bedside  Severity of Illness: The appropriate patient status for this patient is  INPATIENT. Inpatient status is judged to be reasonable and necessary in order to provide the required intensity of service to ensure the patient's safety. The patient's presenting symptoms, physical exam findings, and initial radiographic and laboratory data in the context of their chronic comorbidities is felt to place them at high risk for further clinical deterioration. Furthermore, it is not anticipated that the patient will be medically stable for discharge from the hospital within 2 midnights of admission.   * I certify that at the point of admission it is my clinical judgment that the patient will require inpatient hospital care spanning beyond 2 midnights from the point of admission due to high intensity of service, high risk for further deterioration and high frequency of surveillance required.*  AuthorBarbette Merino, MD 11/14/2022 5:43 PM  For on call review [email or web address].

## 2022-11-15 DIAGNOSIS — D638 Anemia in other chronic diseases classified elsewhere: Secondary | ICD-10-CM | POA: Diagnosis not present

## 2022-11-15 DIAGNOSIS — D5700 Hb-SS disease with crisis, unspecified: Secondary | ICD-10-CM | POA: Diagnosis not present

## 2022-11-15 DIAGNOSIS — G894 Chronic pain syndrome: Secondary | ICD-10-CM | POA: Diagnosis not present

## 2022-11-15 LAB — COMPREHENSIVE METABOLIC PANEL
ALT: 40 U/L (ref 0–44)
AST: 62 U/L — ABNORMAL HIGH (ref 15–41)
Albumin: 4 g/dL (ref 3.5–5.0)
Alkaline Phosphatase: 68 U/L (ref 38–126)
Anion gap: 9 (ref 5–15)
BUN: 10 mg/dL (ref 6–20)
CO2: 22 mmol/L (ref 22–32)
Calcium: 9.1 mg/dL (ref 8.9–10.3)
Chloride: 102 mmol/L (ref 98–111)
Creatinine, Ser: 0.63 mg/dL (ref 0.44–1.00)
GFR, Estimated: >60 mL/min (ref >60)
Glucose, Bld: 109 mg/dL — ABNORMAL HIGH (ref 70–99)
Potassium: 3.5 mmol/L (ref 3.5–5.1)
Sodium: 133 mmol/L — ABNORMAL LOW (ref 135–145)
Total Bilirubin: 1.4 mg/dL — ABNORMAL HIGH (ref 0.3–1.2)
Total Protein: 7.2 g/dL (ref 6.5–8.1)

## 2022-11-15 LAB — CBC WITH DIFFERENTIAL/PLATELET
Abs Immature Granulocytes: 0.03 10*3/uL (ref 0.00–0.07)
Basophils Absolute: 0.1 10*3/uL (ref 0.0–0.1)
Basophils Relative: 1 %
Eosinophils Absolute: 0.2 10*3/uL (ref 0.0–0.5)
Eosinophils Relative: 2 %
HCT: 29.5 % — ABNORMAL LOW (ref 36.0–46.0)
Hemoglobin: 10 g/dL — ABNORMAL LOW (ref 12.0–15.0)
Immature Granulocytes: 0 %
Lymphocytes Relative: 42 %
Lymphs Abs: 4.1 10*3/uL — ABNORMAL HIGH (ref 0.7–4.0)
MCH: 29.4 pg (ref 26.0–34.0)
MCHC: 33.9 g/dL (ref 30.0–36.0)
MCV: 86.8 fL (ref 80.0–100.0)
Monocytes Absolute: 1.4 10*3/uL — ABNORMAL HIGH (ref 0.1–1.0)
Monocytes Relative: 15 %
Neutro Abs: 3.9 10*3/uL (ref 1.7–7.7)
Neutrophils Relative %: 40 %
Platelets: 487 10*3/uL — ABNORMAL HIGH (ref 150–400)
RBC: 3.4 MIL/uL — ABNORMAL LOW (ref 3.87–5.11)
RDW: 13.6 % (ref 11.5–15.5)
WBC: 9.6 10*3/uL (ref 4.0–10.5)
nRBC: 0.2 % (ref 0.0–0.2)

## 2022-11-15 MED ORDER — TRAZODONE HCL 50 MG PO TABS
25.0000 mg | ORAL_TABLET | Freq: Once | ORAL | Status: AC
  Administered 2022-11-15: 25 mg via ORAL
  Filled 2022-11-15: qty 1

## 2022-11-15 NOTE — Progress Notes (Signed)
SICKLE CELL SERVICE PROGRESS NOTE  [name] QIO:[id number] DOB: 1990-11-02 DOA: 11/14/2022 PCP: [name], FNP  Assessment/Plan: Active Problems:   Anemia of chronic disease   Sickle cell crisis (HCC)   Chronic pain syndrome  Sickle cell pain crisis: Continues Dilaudid PCA, Toradol and pain medication from home.  On OxyContin.  Patient is still complaining 9 out of 10 pain.  DC telemetry Anemia of chronic disease: Monitor H&H. Chronic pain syndrome: Continue OxyContin Depression with anxiety: Stable at baseline.  Code Status: Full code Family Communication: No family at bedside Disposition Plan: [place]  Pager [phone]. If 7PM-7AM, please contact night-coverage.  11/15/2022, 8:39 AM  LOS: 1 day   Brief narrative: Carmen [name] is a 32 y.o. female with medical history significant of sickle cell disease, anxiety disorder, chronic pain syndrome, thrombocytosis who presented to the ER with persistent pain in her back and legs.  Also chest pain.  Patient initially thought she may have pneumonia.  Patient was seen in the ER and evaluated.  She apparently has taken her home regimen for 2 days with no relief.  Was given treatment in the ER also no significant relief.  Patient appears to have typical sickle cell crisis and is being admitted to the hospital for further evaluation and treatment   Consultants: None  Procedures: None  Antibiotics: None  HPI/Subjective: Patient's pain is currently between 8 and 9 out of 10.  No fever or chills.  Objective: Vitals:   11/14/22 2335 11/15/22 0210 11/15/22 0405 11/15/22 0551  BP:  100/79  119/77  Pulse:  80  69  Resp:  '18 14 18  '$ Temp:  99.1 F (37.3 C)  98.5 F (36.9 C)  TempSrc:  Oral  Oral  SpO2: 97% 99% 100% 96%  Weight:      Height:       Weight change:  No intake or output data in the 24 hours ending 11/15/22 0839  General: Alert, awake, oriented x3, in no acute distress.  HEENT: [place]/AT PEERL,  EOMI Neck: Trachea midline,  no masses, no thyromegal,y no JVD, no carotid bruit OROPHARYNX:  Moist, No exudate/ erythema/lesions.  Heart: Regular rate and rhythm, without murmurs, rubs, gallops, PMI non-displaced, no heaves or thrills on palpation.  Lungs: Clear to auscultation, no wheezing or rhonchi noted. No increased vocal fremitus resonant to percussion  Abdomen: Soft, nontender, nondistended, positive bowel sounds, no masses no hepatosplenomegaly noted..  Neuro: No focal neurological deficits noted cranial nerves II through XII grossly intact. DTRs 2+ bilaterally upper and lower extremities. Strength 5 out of 5 in bilateral upper and lower extremities. Musculoskeletal: No warm swelling or erythema around joints, no spinal tenderness noted. Psychiatric: Patient alert and oriented x3, good insight and cognition, good recent to remote recall. Lymph node survey: No cervical axillary or inguinal lymphadenopathy noted.   Data Reviewed: Basic Metabolic Panel: Recent Labs  Lab 11/14/22 1435  NA 139  K 4.0  CL 106  CO2 24  GLUCOSE 95  BUN 7  CREATININE 0.66  CALCIUM 9.2   Liver Function Tests: Recent Labs  Lab 11/14/22 1435  AST 23  ALT 21  ALKPHOS 60  BILITOT 0.9  PROT 7.3  ALBUMIN 3.9   No results for input(s): "LIPASE", "AMYLASE" in the last 168 hours. No results for input(s): "AMMONIA" in the last 168 hours. CBC: Recent Labs  Lab 11/14/22 1435  WBC 9.2  NEUTROABS 4.9  HGB 10.5*  HCT 29.9*  MCV  83.3  PLT 531*   Cardiac Enzymes: No results for input(s): "CKTOTAL", "CKMB", "CKMBINDEX", "TROPONINI" in the last 168 hours. BNP (last 3 results) No results for input(s): "BNP" in the last 8760 hours.  ProBNP (last 3 results) No results for input(s): "PROBNP" in the last 8760 hours.  CBG: No results for input(s): "GLUCAP" in the last 168 hours.  No results found for this or any previous visit (from the past 240 hour(s)).   Studies: No results  found.  Scheduled Meds:  DULoxetine  30 mg Oral BID   enoxaparin (LOVENOX) injection  40 mg Subcutaneous B76E   folic acid  1 mg Oral Daily   HYDROmorphone   Intravenous Q4H   ketorolac  15 mg Intravenous Q6H   morphine  60 mg Oral Q12H   senna-docusate  1 tablet Oral BID   [START ON 11/20/2022] Vitamin D (Ergocalciferol)  50,000 Units Oral Q7 days   Continuous Infusions:  dextrose 5 % and 0.45% NaCl 125 mL/hr at 11/14/22 2330    Active Problems:   Anemia of chronic disease   Sickle cell crisis (HCC)   Chronic pain syndrome

## 2022-11-15 NOTE — Progress Notes (Signed)
Patient has an active order for telemetry and is refusing to wear the telemetry monitor. Primary RN discussed risks associated with refusal. Patient still declining to wear monitor. Provider notified. No further intervention required at this time.

## 2022-11-16 DIAGNOSIS — D5700 Hb-SS disease with crisis, unspecified: Secondary | ICD-10-CM | POA: Diagnosis not present

## 2022-11-16 NOTE — Progress Notes (Signed)
Subjective: Carmen [name] is a 32 year old female with a medical history significant for sickle cell disease, chronic pain syndrome, opiate dependence and tolerance, history of depression, and anemia of chronic disease was admitted for sickle cell pain crisis. Today, patient continues to have pain primarily to low back and lower extremities.  Pain intensity is 9/10.  She denies headache, chest pain, shortness of breath, urinary symptoms, nausea, vomiting, or diarrhea.  Objective:  Vital signs in last 24 hours:  Vitals:   11/16/22 0558 11/16/22 0800 11/16/22 1125 11/16/22 1148  BP: 113/69  116/68   Pulse: 93  72   Resp: '16 16 18 16  '$ Temp: 98.4 F (36.9 C)  98.4 F (36.9 C)   TempSrc: Oral  Oral   SpO2: 100% 96% 96%   Weight:      Height:        Intake/Output from previous day:   Intake/Output Summary (Last 24 hours) at 11/16/2022 1617 Last data filed at 11/16/2022 0058 Gross per 24 hour  Intake 3525.12 ml  Output --  Net 3525.12 ml    Physical Exam: General: Alert, awake, oriented x3, in no acute distress.  HEENT: [place]/AT PEERL, EOMI Neck: Trachea midline,  no masses, no thyromegal,y no JVD, no carotid bruit OROPHARYNX:  Moist, No exudate/ erythema/lesions.  Heart: Regular rate and rhythm, without murmurs, rubs, gallops, PMI non-displaced, no heaves or thrills on palpation.  Lungs: Clear to auscultation, no wheezing or rhonchi noted. No increased vocal fremitus resonant to percussion  Abdomen: Soft, nontender, nondistended, positive bowel sounds, no masses no hepatosplenomegaly noted..  Neuro: No focal neurological deficits noted cranial nerves II through XII grossly intact. DTRs 2+ bilaterally upper and lower extremities. Strength 5 out of 5 in bilateral upper and lower extremities. Musculoskeletal: No warm swelling or erythema around joints, no spinal tenderness noted. Psychiatric: Patient alert and oriented x3, good insight and cognition, good recent to remote recall. Lymph  node survey: No cervical axillary or inguinal lymphadenopathy noted.  Lab Results:  Basic Metabolic Panel:    Component Value Date/Time   NA 133 (L) 11/15/2022 0853   NA 142 08/24/2022 1213   K 3.5 11/15/2022 0853   CL 102 11/15/2022 0853   CO2 22 11/15/2022 0853   BUN 10 11/15/2022 0853   BUN 10 08/24/2022 1213   CREATININE 0.63 11/15/2022 0853   GLUCOSE 109 (H) 11/15/2022 0853   CALCIUM 9.1 11/15/2022 0853   CBC:    Component Value Date/Time   WBC 9.6 11/15/2022 0853   HGB 10.0 (L) 11/15/2022 0853   HGB 9.7 (L) 08/24/2022 1213   HCT 29.5 (L) 11/15/2022 0853   HCT 30.1 (L) 08/24/2022 1213   PLT 487 (H) 11/15/2022 0853   PLT 477 (H) 08/24/2022 1213   MCV 86.8 11/15/2022 0853   MCV 88 08/24/2022 1213   NEUTROABS 3.9 11/15/2022 0853   NEUTROABS 3.3 08/24/2022 1213   LYMPHSABS 4.1 (H) 11/15/2022 0853   LYMPHSABS 2.8 08/24/2022 1213   MONOABS 1.4 (H) 11/15/2022 0853   EOSABS 0.2 11/15/2022 0853   EOSABS 0.2 08/24/2022 1213   BASOSABS 0.1 11/15/2022 0853   BASOSABS 0.1 08/24/2022 1213    No results found for this or any previous visit (from the past 240 hour(s)).  Studies/Results: No results found.  Medications: Scheduled Meds:  DULoxetine  30 mg Oral BID   enoxaparin (LOVENOX) injection  40 mg Subcutaneous E33I   folic acid  1 mg Oral Daily   HYDROmorphone   Intravenous Q4H  ketorolac  15 mg Intravenous Q6H   morphine  60 mg Oral Q12H   senna-docusate  1 tablet Oral BID   [START ON 11/20/2022] Vitamin D (Ergocalciferol)  50,000 Units Oral Q7 days   Continuous Infusions:  dextrose 5 % and 0.45% NaCl 10 mL/hr at 11/16/22 1435   PRN Meds:.cyclobenzaprine, diphenhydrAMINE, diphenhydrAMINE, naloxone **AND** sodium chloride flush, ondansetron (ZOFRAN) IV, polyethylene glycol  Consultants: none  Procedures: none  Antibiotics: none  Assessment/Plan: Principal Problem:   Sickle cell crisis (HCC) Active Problems:   Anemia of chronic disease   Chronic pain  syndrome  Sickle cell disease with pain crisis: Continue IV Dilaudid PCA Toradol 15 mg every 6 hours MS Contin 60 mg every 12 hours Monitor vital signs very closely, reevaluate pain scale regularly, and supplemental oxygen as needed  Anemia of chronic disease: Hemoglobin is stable and consistent with patient's baseline.  There is no clinical indication for blood transfusion at this time.  Continue to monitor closely.  Chronic pain syndrome: Continue home medications  Depression with anxiety: Continue home medications.  Stable.  No suicidal or homicidal ideations at this time.  Code Status: Full Code Family Communication: N/A Disposition Plan: Not yet ready for discharge   [place], MSN, FNP-C Patient [facility] [address] [place], [place] [address] [phone]  If 7PM-7AM, please contact night-coverage.  11/16/2022, 4:17 PM  LOS: 2 days

## 2022-11-16 NOTE — Progress Notes (Signed)
Patient has active order for cardiac monitoring. Patient has refused to wear the tele monitor. Notified NP, received order to dc the cardiac monitor.

## 2022-11-17 DIAGNOSIS — D5700 Hb-SS disease with crisis, unspecified: Secondary | ICD-10-CM | POA: Diagnosis not present

## 2022-11-17 MED ORDER — HYDROMORPHONE 1 MG/ML IV SOLN
INTRAVENOUS | Status: DC
  Administered 2022-11-17: 6 mg via INTRAVENOUS
  Administered 2022-11-17: 5 mg via INTRAVENOUS
  Administered 2022-11-17: 30 mg via INTRAVENOUS
  Administered 2022-11-17: 9 mg via INTRAVENOUS
  Administered 2022-11-18: 3.5 mg via INTRAVENOUS
  Administered 2022-11-18: 5.5 mg via INTRAVENOUS
  Filled 2022-11-17: qty 30

## 2022-11-17 MED ORDER — TRAZODONE HCL 50 MG PO TABS
25.0000 mg | ORAL_TABLET | Freq: Once | ORAL | Status: AC
  Administered 2022-11-17: 25 mg via ORAL
  Filled 2022-11-17: qty 1

## 2022-11-17 MED ORDER — OXYCODONE HCL 5 MG PO TABS
10.0000 mg | ORAL_TABLET | ORAL | Status: DC
  Administered 2022-11-17 – 2022-11-18 (×5): 10 mg via ORAL
  Filled 2022-11-17 (×5): qty 2

## 2022-11-17 NOTE — Progress Notes (Signed)
Subjective: Carmen [name] is a 32 year old female with a medical history significant for sickle cell disease, chronic pain syndrome, opiate dependence and tolerance, history of depression, and anemia of chronic disease was admitted for sickle cell pain crisis.  Today, patient continues to have pain primarily to low back and lower extremities.  Pain intensity is 7/10.  She denies headache, chest pain, shortness of breath, urinary symptoms, nausea, vomiting, or diarrhea.  Objective:  Vital signs in last 24 hours:  Vitals:   11/17/22 0800 11/17/22 1200 11/17/22 1343 11/17/22 1600  BP:   127/89   Pulse:   88   Resp: '14 15 18 16  '$ Temp:   98.1 F (36.7 C)   TempSrc:   Oral   SpO2: 94% 95% 96% 95%  Weight:      Height:        Intake/Output from previous day:   Intake/Output Summary (Last 24 hours) at 11/17/2022 1645 Last data filed at 11/16/2022 1831 Gross per 24 hour  Intake 1058.88 ml  Output --  Net 1058.88 ml    Physical Exam: General: Alert, awake, oriented x3, in no acute distress.  HEENT: [place]/AT PEERL, EOMI Neck: Trachea midline,  no masses, no thyromegal,y no JVD, no carotid bruit OROPHARYNX:  Moist, No exudate/ erythema/lesions.  Heart: Regular rate and rhythm, without murmurs, rubs, gallops, PMI non-displaced, no heaves or thrills on palpation.  Lungs: Clear to auscultation, no wheezing or rhonchi noted. No increased vocal fremitus resonant to percussion  Abdomen: Soft, nontender, nondistended, positive bowel sounds, no masses no hepatosplenomegaly noted..  Neuro: No focal neurological deficits noted cranial nerves II through XII grossly intact. DTRs 2+ bilaterally upper and lower extremities. Strength 5 out of 5 in bilateral upper and lower extremities. Musculoskeletal: No warm swelling or erythema around joints, no spinal tenderness noted. Psychiatric: Patient alert and oriented x3, good insight and cognition, good recent to remote recall. Lymph node survey: No cervical  axillary or inguinal lymphadenopathy noted.  Lab Results:  Basic Metabolic Panel:    Component Value Date/Time   NA 133 (L) 11/15/2022 0853   NA 142 08/24/2022 1213   K 3.5 11/15/2022 0853   CL 102 11/15/2022 0853   CO2 22 11/15/2022 0853   BUN 10 11/15/2022 0853   BUN 10 08/24/2022 1213   CREATININE 0.63 11/15/2022 0853   GLUCOSE 109 (H) 11/15/2022 0853   CALCIUM 9.1 11/15/2022 0853   CBC:    Component Value Date/Time   WBC 9.6 11/15/2022 0853   HGB 10.0 (L) 11/15/2022 0853   HGB 9.7 (L) 08/24/2022 1213   HCT 29.5 (L) 11/15/2022 0853   HCT 30.1 (L) 08/24/2022 1213   PLT 487 (H) 11/15/2022 0853   PLT 477 (H) 08/24/2022 1213   MCV 86.8 11/15/2022 0853   MCV 88 08/24/2022 1213   NEUTROABS 3.9 11/15/2022 0853   NEUTROABS 3.3 08/24/2022 1213   LYMPHSABS 4.1 (H) 11/15/2022 0853   LYMPHSABS 2.8 08/24/2022 1213   MONOABS 1.4 (H) 11/15/2022 0853   EOSABS 0.2 11/15/2022 0853   EOSABS 0.2 08/24/2022 1213   BASOSABS 0.1 11/15/2022 0853   BASOSABS 0.1 08/24/2022 1213    No results found for this or any previous visit (from the past 240 hour(s)).  Studies/Results: No results found.  Medications: Scheduled Meds:  DULoxetine  30 mg Oral BID   enoxaparin (LOVENOX) injection  40 mg Subcutaneous E72C   folic acid  1 mg Oral Daily   HYDROmorphone   Intravenous Q4H   ketorolac  15 mg Intravenous Q6H   morphine  60 mg Oral Q12H   oxyCODONE  10 mg Oral Q4H while awake   senna-docusate  1 tablet Oral BID   [START ON 11/20/2022] Vitamin D (Ergocalciferol)  50,000 Units Oral Q7 days   Continuous Infusions:  dextrose 5 % and 0.45% NaCl 10 mL/hr at 11/17/22 0235   PRN Meds:.cyclobenzaprine, diphenhydrAMINE, diphenhydrAMINE, naloxone **AND** sodium chloride flush, ondansetron (ZOFRAN) IV, polyethylene glycol  Consultants: none  Procedures: none  Antibiotics: none  Assessment/Plan: Principal Problem:   Sickle cell crisis (HCC) Active Problems:   Anemia of chronic  disease   Chronic pain syndrome  Sickle cell disease with pain crisis: Weaning IV Dilaudid PCA Toradol 15 mg every 6 hours MS Contin 60 mg every 12 hours Oxycodone 10 mg every 4 hours as needed for severe breakthrough pain Monitor vital signs very closely, reevaluate pain scale regularly, and supplemental oxygen as needed  Anemia of chronic disease: Hemoglobin is stable and consistent with patient's baseline.  There is no clinical indication for blood transfusion at this time.  Continue to monitor closely.  Chronic pain syndrome: Continue home medications  Depression with anxiety: Continue home medications.  Stable.  No suicidal or homicidal ideations at this time.  Code Status: Full Code Family Communication: N/A Disposition Plan: Not yet ready for discharge   [place], MSN, FNP-C Patient [facility] [address] [place], [place] [address] [phone]  If 7PM-7AM, please contact night-coverage.  11/17/2022, 4:45 PM  LOS: 3 days

## 2022-11-17 NOTE — TOC Initial Note (Signed)
Transition of Care [facility]) - Initial/Assessment Note    Patient Details  Name: Carmen [name] MRN: [id number] Date of Birth: August 30, 1991  Transition of [facility]) CM/SW Contact:    Angelita Ingles, RN Phone [phone]  11/17/2022, 4:15 PM  Clinical Narrative:                  Transition of Care [facility]) Screening Note   Patient Details  Name: Carmen [name] Date of Birth: [birth year]   Transition of Care [facility]) CM/SW Contact:    Angelita Ingles, RN Phone Number: 11/17/2022, 4:15 PM    Transition of Care Department [facility]) has reviewed patient and no TOC needs have been identified at this time. We will continue to monitor patient advancement through interdisciplinary progression rounds. If new patient transition needs arise, please place a TOC consult.          Patient Goals and CMS Choice            Expected Discharge Plan and Services                                              Prior Living Arrangements/Services                       Activities of Daily Living Home Assistive Devices/Equipment: None ADL Screening (condition at time of admission) Patient's cognitive ability adequate to safely complete daily activities?: Yes Is the patient deaf or have difficulty hearing?: No Does the patient have difficulty seeing, even when wearing glasses/contacts?: No Does the patient have difficulty concentrating, remembering, or making decisions?: No Patient able to express need for assistance with ADLs?: Yes Does the patient have difficulty dressing or bathing?: No Independently performs ADLs?: Yes (appropriate for developmental age) Does the patient have difficulty walking or climbing stairs?: No Weakness of Legs: None Weakness of Arms/Hands: None  Permission Sought/Granted                  Emotional Assessment              Admission diagnosis:  Sickle cell crisis ([place]) [D57.00] Sickle cell pain crisis ([place]) [D57.00] Patient Active  Problem List   Diagnosis Date Noted   Nausea & vomiting 03/03/2022   Acute chest syndrome due to sickle cell crisis ([place]) 09/05/2021   Chronic pain syndrome 08/21/2021   Moderate episode of recurrent major depressive disorder ([place]) 05/28/2021   Major depressive disorder 05/22/2021   Tobacco abuse 05/21/2021   Sickle cell disease with crisis ([place]) 03/23/2021   Acute pain of right shoulder    Sickle cell anemia with crisis ([place]) 02/16/2021   Community acquired pneumonia of right lower lobe of lung    Fever of unknown origin 10/14/2020   Sickle cell crisis ([place]) 03/31/2020   Bacterial vaginitis 01/04/2020   Acute cystitis without hematuria    Dysuria 12/31/2019   Urine leukocytes    Sickle cell pain crisis ([place]) 09/10/2019   Sickle cell disease ([place]) 12/04/2018   Alpha thalassemia silent carrier 12/04/2018   Chronic prescription opiate use 03/13/2018   Chronic musculoskeletal pain 03/13/2018   Vitamin D deficiency 01/13/2018   Leukocytosis 08/21/2017   Hypokalemia 06/27/2017   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[facility]) 04/05/2017   Constipation due to pain medication    Hb-SS disease without crisis ([place]) 08/15/2016  Anemia of chronic disease    Chest pain 11/10/2015   Thrombocytosis 91/79/1505   Systolic ejection murmur 69/79/4801   Sickle cell anemia of mother during pregnancy ([place]) 05/16/2014   PCP:  [name], FNP Pharmacy[facility]   [id number] [place] [id number]: [phone]: [phone]    Social Determinants of Health (SDOH) Social History: [place] No Food Insecurity (11/15/2022)  Housing: Low Risk  (11/15/2022)  Transportation Needs: No Transportation Needs (11/15/2022)  Recent Concern: Transportation Needs - Unmet Transportation Needs (08/25/2022)  Utilities: Not At Risk (11/15/2022)  Depression (PHQ2-9): High  Risk (08/24/2022)  Tobacco Use: High Risk (11/14/2022)   SDOH Interventions:     Readmission Risk Interventions    09/13/2022    2:06 PM 09/13/2022    9:03 AM 08/16/2022    2:56 PM  Readmission Risk Prevention Plan  Transportation Screening Complete Complete Complete  Medication Review (Press photographer  Complete Complete  PCP or Specialist appointment within 3-5 days of discharge  Complete Complete  HRI or Home Care Consult  Complete Complete  SW Recovery Care/Counseling Consult  Complete Complete  Palliative Care Screening  Not Applicable Not [place] Not Applicable Not Applicable

## 2022-11-17 NOTE — Progress Notes (Signed)
Mobility Specialist - Progress Note   11/17/22 1228  Mobility  Activity Ambulated with assistance in room;Transferred from bed to chair  Level of Assistance Contact guard assist, steadying assist  Assistive Device None  Distance Ambulated (ft) 5 ft  Activity Response Tolerated well  Mobility Referral Yes  $Mobility charge 1 Mobility   Pt received in bed and did not feel up for mobility due to pain. Agreed to at least transfer to chair. Pt left in chair with all needs met.   Roderick Pee Mobility Specialist

## 2022-11-17 NOTE — Plan of Care (Signed)
  Problem: Sensory: Goal: Pain level will decrease with appropriate interventions Outcome: Not Progressing   Problem: Health Behavior: Goal: Postive changes in compliance with treatment and prescription regimens will improve Outcome: Not Progressing

## 2022-11-18 ENCOUNTER — Telehealth: Payer: Self-pay

## 2022-11-18 DIAGNOSIS — D5700 Hb-SS disease with crisis, unspecified: Secondary | ICD-10-CM | POA: Diagnosis not present

## 2022-11-18 LAB — BASIC METABOLIC PANEL
Anion gap: 8 (ref 5–15)
BUN: 11 mg/dL (ref 6–20)
CO2: 25 mmol/L (ref 22–32)
Calcium: 9.1 mg/dL (ref 8.9–10.3)
Chloride: 103 mmol/L (ref 98–111)
Creatinine, Ser: 0.51 mg/dL (ref 0.44–1.00)
GFR, Estimated: >60 mL/min (ref >60)
Glucose, Bld: 83 mg/dL (ref 70–99)
Potassium: 4 mmol/L (ref 3.5–5.1)
Sodium: 136 mmol/L (ref 135–145)

## 2022-11-18 LAB — CBC
HCT: 26.3 % — ABNORMAL LOW (ref 36.0–46.0)
Hemoglobin: 9.3 g/dL — ABNORMAL LOW (ref 12.0–15.0)
MCH: 29.2 pg (ref 26.0–34.0)
MCHC: 35.4 g/dL (ref 30.0–36.0)
MCV: 82.4 fL (ref 80.0–100.0)
Platelets: 467 10*3/uL — ABNORMAL HIGH (ref 150–400)
RBC: 3.19 MIL/uL — ABNORMAL LOW (ref 3.87–5.11)
RDW: 14.4 % (ref 11.5–15.5)
WBC: 10.9 10*3/uL — ABNORMAL HIGH (ref 4.0–10.5)
nRBC: 0.4 % — ABNORMAL HIGH (ref 0.0–0.2)

## 2022-11-18 MED ORDER — OXYCODONE HCL 10 MG PO TABS: 10.0000 mg | ORAL_TABLET | Freq: Four times a day (QID) | ORAL | 0 refills | Status: DC | PRN

## 2022-11-18 MED ORDER — MORPHINE SULFATE ER 60 MG PO TBCR: 60.0000 mg | EXTENDED_RELEASE_TABLET | Freq: Two times a day (BID) | ORAL | 0 refills | Status: DC

## 2022-11-18 MED ORDER — DULOXETINE HCL 30 MG PO CPEP: 60.0000 mg | ORAL_CAPSULE | Freq: Every day | ORAL | 1 refills | Status: DC

## 2022-11-18 MED ORDER — CYCLOBENZAPRINE HCL 10 MG PO TABS: 10.0000 mg | ORAL_TABLET | Freq: Every day | ORAL | 2 refills | Status: DC

## 2022-11-18 NOTE — Telephone Encounter (Signed)
Morphine prior auth submitted to [employer] today confirmation #[id number]

## 2022-11-18 NOTE — Plan of Care (Signed)
  Problem: Tissue Perfusion: Goal: Complications related to inadequate tissue perfusion will be avoided or minimized Outcome: Not Progressing   Problem: Pain Managment: Goal: General experience of comfort will improve Outcome: Not Progressing

## 2022-11-18 NOTE — Discharge Summary (Signed)
Physician Discharge Summary  Carmen [name] HGD:[id number] DOB: Mar 24, 1991 DOA: 11/14/2022  PCP: [name], FNP  Admit date: 11/14/2022  Discharge date: 11/18/2022  Discharge Diagnoses:  Principal Problem:   Sickle cell crisis ([place]) Active Problems:   Anemia of chronic disease   Chronic pain syndrome   Discharge Condition: Stable  Disposition:  Pt is discharged home in good condition and is to follow up with [name], FNP this week to have labs evaluated. Carmen [name] is instructed to increase activity slowly and balance with rest for the next few days, and use prescribed medication to complete treatment of pain  Diet: Regular Wt Readings from Last 3 Encounters:  11/14/22 86.2 kg  09/12/22 88.8 kg  08/24/22 87 kg    History of present illness:  Carmen [name] is a 32 year old female with a medical history significant for sickle cell disease, chronic pain syndrome, opiate dependence and tolerance, history of depression, and history of anemia of chronic disease who presented to the emergency department with persistent pain in her back and legs.  Also, endorses chest pain.  Patient initially thought she may be having pneumonia.  Patient was seen in the ER and evaluated.  She apparently has taken her home pain medication regimen for 2 days with no relief.  Patient was given treatment in the ER without any significant relief.  Patient appears to have typical sickle cell crisis and is admitted to the hospital for further treatment and evaluation.  Hospital Course:  Sickle cell disease with pain crisis: Patient was admitted for sickle cell pain crisis and managed appropriately with IVF, IV Dilaudid via PCA and IV Toradol, as well as other adjunct therapies per sickle cell pain management protocols.  IV Dilaudid PCA was weaned appropriately and patient was transition to her home medication.  Medications are managed by patient's PCP.  Patient's prescription opiates are due for  refill.  PDMP was reviewed, no inconsistencies noted.  MS Contin 60 mg every 12 hours #60 and oxycodone 10 mg every 6 hours as needed #60 was sent to patient's pharmacy. Patient states that her pain intensity is 5/10 and she feels that she can function at home on current medication regimen.  The patient has follow-up appointment in primary care on 11/23/2022.  At that time, we will repeat CBC with differential and CMP.  Patient is aware of all upcoming appointments.  She is alert, oriented, and ambulating without assistance.  Patient was therefore discharged home today in a hemodynamically stable condition.   Carmen [name] will follow-up with PCP within 1 week of this discharge. Carmen [name] was counseled extensively about nonpharmacologic means of pain management, patient verbalized understanding and was appreciative of  the care received during this admission.   We discussed the need for good hydration, monitoring of hydration status, avoidance of heat, cold, stress, and infection triggers. We discussed the need to be adherent with taking Hydrea and other home medications. Patient was reminded of the need to seek medical attention immediately if any symptom of bleeding, anemia, or infection occurs.  Discharge Exam: Vitals:   11/18/22 0753 11/18/22 0800  BP:  120/62  Pulse:  84  Resp:  16  Temp:  99.1 F (37.3 C)  SpO2: 97%    Vitals:   11/18/22 0300 11/18/22 0350 11/18/22 0753 11/18/22 0800  BP: (!) 118/59   120/62  Pulse: 90   84  Resp: '18 14  16  '$ Temp: 98.9 F (37.2 C)   99.1 F (37.3  C)  TempSrc: Oral   Oral  SpO2: 96% 95% 97%   Weight:      Height:        General appearance : Awake, alert, not in any distress. Speech Clear. Not toxic looking HEENT: Atraumatic and Normocephalic, pupils equally reactive to light and accomodation Neck: Supple, no JVD. No cervical lymphadenopathy.  Chest: Good air entry bilaterally, no added sounds  CVS: S1 S2 regular, no murmurs.  Abdomen: Bowel sounds  present, Non tender and not distended with no gaurding, rigidity or rebound. Extremities: B/L Lower Ext shows no edema, both legs are warm to touch Neurology: Awake alert, and oriented X 3, CN II-XII intact, Non focal Skin: No Rash  Discharge Instructions  Discharge Instructions     Discharge patient   Complete by: As directed    Discharge disposition: 01-Home or Self Care   Discharge patient date: 11/18/2022      Allergies as of 11/18/2022       Reactions   Ketamine Hives, Nausea And Vomiting, Other (See Comments)   Makes the patient "feel funny" also        Medication List     TAKE these medications    betamethasone valerate ointment 0.1 % Commonly known as: VALISONE APPLY TO AFFECTED AREA TWICE A DAY   cyclobenzaprine 10 MG tablet Commonly known as: FLEXERIL TAKE 1 TABLET BY MOUTH DAILY AS NEEDED FOR MUSCLE SPASMS. What changed: See the new instructions.   DULoxetine 30 MG capsule Commonly known as: CYMBALTA Take 2 capsules (60 mg total) by mouth daily. What changed:  how much to take when to take this   folic acid 1 MG tablet Commonly known as: FOLVITE Take 1 tablet (1 mg total) by mouth daily.   morphine 60 MG 12 hr tablet Commonly known as: MS CONTIN Take 1 tablet (60 mg total) by mouth every 12 (twelve) hours.   Oxycodone HCl 10 MG Tabs Take 1 tablet (10 mg total) by mouth every 6 (six) hours as needed (pain).   Vitamin D (Ergocalciferol) 1.25 MG (50000 UNIT) Caps capsule Commonly known as: DRISDOL TAKE ONE CAPSULE BY MOUTH ONE TIME PER WEEK What changed:  how much to take how to take this when to take this additional instructions        The results of significant diagnostics from this hospitalization (including imaging, microbiology, ancillary and laboratory) are listed below for reference.    Significant Diagnostic Studies: No results found.  Microbiology: No results found for this or any previous visit (from the past 240 hour(s)).    Labs: Basic Metabolic Panel: Recent Labs  Lab 11/14/22 1435 11/15/22 0853 11/18/22 0530  NA 139 133* 136  K 4.0 3.5 4.0  CL 106 102 103  CO2 '24 22 25  '$ GLUCOSE 95 109* 83  BUN '7 10 11  '$ CREATININE 0.66 0.63 0.51  CALCIUM 9.2 9.1 9.1   Liver Function Tests: Recent Labs  Lab 11/14/22 1435 11/15/22 0853  AST 23 62*  ALT 21 40  ALKPHOS 60 68  BILITOT 0.9 1.4*  PROT 7.3 7.2  ALBUMIN 3.9 4.0   No results for input(s): "LIPASE", "AMYLASE" in the last 168 hours. No results for input(s): "AMMONIA" in the last 168 hours. CBC: Recent Labs  Lab 11/14/22 1435 11/15/22 0853 11/18/22 0530  WBC 9.2 9.6 10.9*  NEUTROABS 4.9 3.9  --   HGB 10.5* 10.0* 9.3*  HCT 29.9* 29.5* 26.3*  MCV 83.3 86.8 82.4  PLT 531* 487* 467*  Cardiac Enzymes: No results for input(s): "CKTOTAL", "CKMB", "CKMBINDEX", "TROPONINI" in the last 168 hours. BNP: Invalid input(s): "POCBNP" CBG: No results for input(s): "GLUCAP" in the last 168 hours.  Time coordinating discharge: 30 minutes  Signed:  [name]  APRN, MSN, FNP-C [facility] [address] [place], [place] [address] [phone]  [facility] 11/18/2022, 12:27 PM

## 2022-11-19 ENCOUNTER — Other Ambulatory Visit: Payer: Self-pay

## 2022-11-19 ENCOUNTER — Telehealth: Payer: Self-pay

## 2022-11-19 NOTE — Telephone Encounter (Signed)
Transition Care Management Follow-up Telephone Call Date of discharge and from where: [facility] 11/18/2022 How have you been since you were released from the hospital? better Any questions or concerns? No  Items Reviewed: Did the pt receive and understand the discharge instructions provided? Yes  Medications obtained and verified? Yes  Other? No  Any new allergies since your discharge? No  Dietary orders reviewed? Yes Do you have support at home? Yes   Home Care and Equipment/Supplies: Were home health services ordered? no If so, what is the name of the agency? N/a  Has the agency set up a time to come to the patient's home? no Were any new equipment or medical supplies ordered?  No What is the name of the medical supply agency? N/a Were you able to get the supplies/equipment? no Do you have any questions related to the use of the equipment or supplies? No  Functional Questionnaire: (I = Independent and D = Dependent) ADLs: I  Bathing/Dressing- I  Meal Prep- I  Eating- I  Maintaining continence- I  Transferring/Ambulation- I  Managing Meds- I  Follow up appointments reviewed:  PCP Hospital f/u appt confirmed? Yes  Scheduled to see [name] on 11/23/2022 @ 10:40. [facility] f/u appt confirmed? No   Are transportation arrangements needed? No  If their condition worsens, is the pt aware to call PCP or go to the Emergency Dept.? Yes Was the patient provided with contact information for the PCP's office or ED? Yes Was to pt encouraged to call back with questions or concerns? Yes Juanda Crumble, LPN [place] Direct Dial [phone]

## 2022-11-19 NOTE — Telephone Encounter (Signed)
APPROVED UNTIL 02/16/2023

## 2022-11-23 ENCOUNTER — Ambulatory Visit (INDEPENDENT_AMBULATORY_CARE_PROVIDER_SITE_OTHER): Payer: Medicaid Other | Admitting: Family Medicine

## 2022-11-23 ENCOUNTER — Encounter: Payer: Self-pay | Admitting: Family Medicine

## 2022-11-23 VITALS — BP 123/78 | HR 89 | Temp 97.8°F | Ht 63.5 in | Wt 193.2 lb

## 2022-11-23 DIAGNOSIS — G894 Chronic pain syndrome: Secondary | ICD-10-CM

## 2022-11-23 DIAGNOSIS — D571 Sickle-cell disease without crisis: Secondary | ICD-10-CM | POA: Diagnosis not present

## 2022-11-23 DIAGNOSIS — Z3042 Encounter for surveillance of injectable contraceptive: Secondary | ICD-10-CM | POA: Diagnosis not present

## 2022-11-23 DIAGNOSIS — E559 Vitamin D deficiency, unspecified: Secondary | ICD-10-CM

## 2022-11-23 MED ORDER — MEDROXYPROGESTERONE ACETATE 150 MG/ML IM SUSP
150.0000 mg | Freq: Once | INTRAMUSCULAR | Status: AC
  Administered 2022-11-23: 150 mg via INTRAMUSCULAR

## 2022-11-23 NOTE — Progress Notes (Signed)
Established Patient Office Visit  Subjective   Patient ID: Carmen [name], female    DOB: [birth year]  Age: 32 y.o. MRN: [id number]  No chief complaint on file.   Carmen [name] is a    Patient Active Problem List   Diagnosis Date Noted  . Nausea & vomiting 03/03/2022  . Acute chest syndrome due to sickle cell crisis ([place]) 09/05/2021  . Chronic pain syndrome 08/21/2021  . Moderate episode of recurrent major depressive disorder ([place]) 05/28/2021  . Major depressive disorder 05/22/2021  . Tobacco abuse 05/21/2021  . Sickle cell disease with crisis ([place]) 03/23/2021  . Acute pain of right shoulder   . Sickle cell anemia with crisis ([place]) 02/16/2021  . Community acquired pneumonia of right lower lobe of lung   . Fever of unknown origin 10/14/2020  . Sickle cell crisis ([place]) 03/31/2020  . Bacterial vaginitis 01/04/2020  . Acute cystitis without hematuria   . Dysuria 12/31/2019  . Urine leukocytes   . Sickle cell pain crisis ([place]) 09/10/2019  . Sickle cell disease ([place]) 12/04/2018  . Alpha thalassemia silent carrier 12/04/2018  . Chronic prescription opiate use 03/13/2018  . Chronic musculoskeletal pain 03/13/2018  . Vitamin D deficiency 01/13/2018  . Leukocytosis 08/21/2017  . Hypokalemia 06/27/2017  . 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[place]) 04/05/2017  . Constipation due to pain medication   . Hb-SS disease without crisis ([place]) 08/15/2016  . Anemia of chronic disease   . Chest pain 11/10/2015  . Thrombocytosis 11/22/2014  . Systolic ejection murmur 30/16/0109  . Sickle cell anemia of mother during pregnancy [facility]) 05/16/2014   Past Medical History:  Diagnosis Date  . Anxiety   . Headache(784.0)   . Heart murmur   . Sickle cell crisis ([place])   . Syphilis 2015   Was diagnosed and received one injection of antibiotics  . Thrombocytosis 11/22/2014     CBC Latest Ref Rng & Units 12/13/2018 12/11/2018 12/10/2018 WBC 4.0 - 10.5 K/uL 14.8(H) 13.2(H) 15.9(H) Hemoglobin 12.0 -  15.0 g/dL 8.2(L) 7.7(L) 8.1(L) Hematocrit 36.0 - 46.0 % 23.7(L) 23.2(L) 24.5(L) Platelets 150 - 400 K/uL 326 399 388     Past Surgical History:  Procedure Laterality Date  . CESAREAN SECTION N/A 02/05/2019   Procedure: CESAREAN SECTION;  Surgeon: [name], MD;  Location: [facility];  Service: Obstetrics;  Laterality: N/A;  . CHOLECYSTECTOMY N/A 11/30/2014   Procedure: LAPAROSCOPIC CHOLECYSTECTOMY SINGLE SITE WITH INTRAOPERATIVE CHOLANGIOGRAM;  Surgeon: Michael Boston, MD;  Location: [facility] ORS;  Service: General;  Laterality: N/A;  . SPLENECTOMY     Social History   Tobacco Use  . Smoking status: Some Days    Types: Cigarettes  . Smokeless tobacco: Never  Vaping Use  . Vaping Use: Never used  Substance Use Topics  . Alcohol use: No  . Drug use: No   Social History   Socioeconomic History  . Marital status: Single    Spouse name: Not on file  . Number of children: Not on file  . Years of education: Not on file  . Highest education level: Not on file  Occupational History  . Not on file  Tobacco Use  . Smoking status: Some Days    Types: Cigarettes  . Smokeless tobacco: Never  Vaping Use  . Vaping Use: Never used  Substance and Sexual Activity  . Alcohol use: No  . Drug use: No  . Sexual activity: Yes    Birth control/protection: Injection  Other Topics  Concern  . Not on file  Social History Narrative  . Not on file   Social Determinants of Health   Financial Resource Strain: Not on file  Food Insecurity: No Food Insecurity (11/15/2022)   Hunger Vital Sign   . Worried About Charity fundraiser in the Last Year: Never true   . Ran Out of Food in the Last Year: Never true  Transportation Needs: Unmet Transportation Needs (11/23/2022)   PRAPARE - Transportation   . Lack of Transportation (Medical): Yes   . Lack of Transportation (Non-Medical): No  Physical Activity: Not on file  Stress: Not on file  Social Connections: Not on file  Intimate Partner Violence: Not  At Risk (11/15/2022)   Humiliation, Afraid, Rape, and Kick questionnaire   . Fear of Current or Ex-Partner: No   . Emotionally Abused: No   . Physically Abused: No   . Sexually Abused: No   Family Status  Relation Name Status  . Mother  Alive  . Father  Alive  . Sister  (Not Specified)  . Sister  (Not Specified)  . Sister  (Not Specified)  . Mat Aunt  (Not Specified)  . Mat Uncle  (Not Specified)  . Sister  (Not Specified)   Family History  Problem Relation Age of Onset  . Hypertension Mother   . Sickle cell anemia Sister   . Kidney disease Sister        Lupus  . Arthritis Sister   . Sickle cell anemia Sister   . Sickle cell trait Sister   . Heart disease Maternal Aunt        CABG  . Heart disease Maternal Uncle        CABG  . Lupus Sister    Allergies  Allergen Reactions  . Ketamine Hives, Nausea And Vomiting and Other (See Comments)    Makes the patient "feel funny" also      Review of Systems  Constitutional: Negative.   HENT: Negative.    Respiratory: Negative.    Cardiovascular: Negative.   Gastrointestinal: Negative.   Genitourinary: Negative.   Musculoskeletal:  Positive for back pain and joint pain.  Skin: Negative.   Neurological: Negative.   Psychiatric/Behavioral: Negative.        Objective:     BP 123/78   Pulse 89   Temp 97.8 F (36.6 C)   Ht 5' 3.5" (1.613 m)   Wt 193 lb 3.2 oz (87.6 kg)   LMP 11/14/2022   SpO2 100%   BMI 33.69 kg/m  BP Readings from Last 3 Encounters:  11/23/22 123/78  11/18/22 120/62  09/17/22 (!) 126/93   Wt Readings from Last 3 Encounters:  11/23/22 193 lb 3.2 oz (87.6 kg)  11/14/22 190 lb (86.2 kg)  09/12/22 195 lb 12.3 oz (88.8 kg)      Physical Exam Constitutional:      Appearance: Normal appearance. She is obese.  Cardiovascular:     Rate and Rhythm: Normal rate and regular rhythm.     Pulses: Normal pulses.  Pulmonary:     Effort: Pulmonary effort is normal.  Abdominal:     General: Bowel  sounds are normal.  Skin:    General: Skin is warm.  Neurological:     General: No focal deficit present.     Mental Status: She is alert. Mental status is at baseline.  Psychiatric:        Mood and Affect: Mood normal.  Behavior: Behavior normal.        Thought Content: Thought content normal.        Judgment: Judgment normal.     No results found for any visits on 11/23/22.  Last CBC Lab Results  Component Value Date   WBC 10.9 (H) 11/18/2022   HGB 9.3 (L) 11/18/2022   HCT 26.3 (L) 11/18/2022   MCV 82.4 11/18/2022   MCH 29.2 11/18/2022   RDW 14.4 11/18/2022   PLT 467 (H) 16/08/9603   Last metabolic panel Lab Results  Component Value Date   GLUCOSE 83 11/18/2022   NA 136 11/18/2022   K 4.0 11/18/2022   CL 103 11/18/2022   CO2 25 11/18/2022   BUN 11 11/18/2022   CREATININE 0.51 11/18/2022   GFRNONAA >60 11/18/2022   CALCIUM 9.1 11/18/2022   PHOS 3.6 05/22/2021   PROT 7.2 11/15/2022   ALBUMIN 4.0 11/15/2022   LABGLOB 3.9 08/24/2022   AGRATIO 1.2 08/24/2022   BILITOT 1.4 (H) 11/15/2022   ALKPHOS 68 11/15/2022   AST 62 (H) 11/15/2022   ALT 40 11/15/2022   ANIONGAP 8 11/18/2022   Last lipids No results found for: "CHOL", "HDL", "LDLCALC", "LDLDIRECT", "TRIG", "CHOLHDL" Last hemoglobin A1c No results found for: "HGBA1C" Last thyroid functions No results found for: "TSH", "T3TOTAL", "T4TOTAL", "THYROIDAB" Last vitamin D Lab Results  Component Value Date   VD25OH 23.1 (L) 08/24/2022   Last vitamin B12 and Folate Lab Results  Component Value Date   VITAMINB12 620 09/15/2018      The ASCVD Risk score (Arnett DK, et al., 2019) failed to calculate for the following reasons:   The 2019 ASCVD risk score is only valid for ages 55 to 68    Assessment & Plan:   Problem List Items Addressed This Visit       Other   Hb-SS disease without crisis ([place]) (Chronic)   Vitamin D deficiency   Chronic pain syndrome - Primary   Other Visit Diagnoses      Encounter for surveillance of injectable contraceptive       Relevant Orders   Ambulatory referral to Obstetrics / Gynecology      Return in about 3 months (around 02/22/2023) for sickle cell anemia.   [name]  APRN, MSN, FNP-C [facility] [address] [place], [place] [address] [phone]

## 2022-11-25 LAB — DRUG SCREEN 764883 11+OXYCO+ALC+CRT-BUND

## 2022-11-28 LAB — OPIATES CONFIRMATION, URINE
Codeine: NEGATIVE
Hydrocodone: NEGATIVE
Hydromorphone: NEGATIVE
Morphine Confirm: 1318 ng/mL
Morphine: POSITIVE — AB
Opiates: POSITIVE ng/mL — AB

## 2022-11-28 LAB — DRUG SCREEN 764883 11+OXYCO+ALC+CRT-BUND
Amphetamines, Urine: NEGATIVE ng/mL
BENZODIAZ UR QL: NEGATIVE ng/mL
Barbiturate: NEGATIVE ng/mL
Cocaine (Metabolite): NEGATIVE ng/mL
Creatinine: 122.7 mg/dL (ref 20.0–300.0)
Ethanol: NEGATIVE %
Meperidine: NEGATIVE ng/mL
Methadone Screen, Urine: NEGATIVE ng/mL
Phencyclidine: NEGATIVE ng/mL
Propoxyphene: NEGATIVE ng/mL
Tramadol: NEGATIVE ng/mL
pH, Urine: 6.1 (ref 4.5–8.9)

## 2022-11-28 LAB — CANNABINOID CONFIRMATION, UR
CANNABINOIDS: POSITIVE — AB
Carboxy THC GC/MS Conf: 391 ng/mL

## 2022-11-28 LAB — OXYCODONE/OXYMORPHONE, CONFIRM
OXYCODONE/OXYMORPH: POSITIVE — AB
OXYCODONE: >3000 ng/mL
OXYCODONE: POSITIVE — AB
OXYMORPHONE (GC/MS): >3000 ng/mL
OXYMORPHONE: POSITIVE — AB

## 2022-12-02 ENCOUNTER — Other Ambulatory Visit: Payer: Self-pay

## 2022-12-02 DIAGNOSIS — D571 Sickle-cell disease without crisis: Secondary | ICD-10-CM

## 2022-12-02 DIAGNOSIS — Z79891 Long term (current) use of opiate analgesic: Secondary | ICD-10-CM

## 2022-12-02 MED ORDER — OXYCODONE HCL 10 MG PO TABS: 10.0000 mg | ORAL_TABLET | Freq: Four times a day (QID) | ORAL | 0 refills | Status: DC | PRN

## 2022-12-02 NOTE — Telephone Encounter (Signed)
From: [name] To: Office of [name], [place] Sent: 12/01/2022 7:05 PM EST Subject: Medication Renewal Request  Refills have been requested for the following medications:   Oxycodone HCl 10 MG TABS [Lachina Hollis]  Preferred pharmacy: [id number] [place] [place] - [address]ivery method: [employer]

## 2022-12-02 NOTE — Telephone Encounter (Signed)
Please advise KH 

## 2022-12-06 ENCOUNTER — Telehealth: Payer: Self-pay

## 2022-12-06 ENCOUNTER — Other Ambulatory Visit: Payer: Self-pay

## 2022-12-06 NOTE — Telephone Encounter (Signed)
Prior auth for Oxycodone submitted to Medicaid via [place] today. Confirmation #[id number]

## 2022-12-06 NOTE — Telephone Encounter (Signed)
Approved until 03/06/23

## 2022-12-16 ENCOUNTER — Inpatient Hospital Stay (HOSPITAL_COMMUNITY)
Admission: AD | Admit: 2022-12-16 | Discharge: 2022-12-20 | DRG: 812 | Disposition: A | Discharge status: Home / Self Care | Payer: Medicaid Other | Source: Ambulatory Visit | Attending: Internal Medicine | Admitting: Internal Medicine

## 2022-12-16 ENCOUNTER — Telehealth (HOSPITAL_COMMUNITY): Payer: Self-pay

## 2022-12-16 ENCOUNTER — Other Ambulatory Visit: Payer: Self-pay

## 2022-12-16 DIAGNOSIS — Z888 Allergy status to other drugs, medicaments and biological substances status: Secondary | ICD-10-CM

## 2022-12-16 DIAGNOSIS — Z8249 Family history of ischemic heart disease and other diseases of the circulatory system: Secondary | ICD-10-CM | POA: Diagnosis not present

## 2022-12-16 DIAGNOSIS — Z79899 Other long term (current) drug therapy: Secondary | ICD-10-CM | POA: Diagnosis not present

## 2022-12-16 DIAGNOSIS — F419 Anxiety disorder, unspecified: Secondary | ICD-10-CM | POA: Diagnosis present

## 2022-12-16 DIAGNOSIS — D571 Sickle-cell disease without crisis: Secondary | ICD-10-CM

## 2022-12-16 DIAGNOSIS — F17210 Nicotine dependence, cigarettes, uncomplicated: Secondary | ICD-10-CM | POA: Diagnosis present

## 2022-12-16 DIAGNOSIS — Z79891 Long term (current) use of opiate analgesic: Secondary | ICD-10-CM

## 2022-12-16 DIAGNOSIS — D5700 Hb-SS disease with crisis, unspecified: Principal | ICD-10-CM | POA: Diagnosis present

## 2022-12-16 DIAGNOSIS — F329 Major depressive disorder, single episode, unspecified: Secondary | ICD-10-CM | POA: Diagnosis present

## 2022-12-16 DIAGNOSIS — Z832 Family history of diseases of the blood and blood-forming organs and certain disorders involving the immune mechanism: Secondary | ICD-10-CM | POA: Diagnosis not present

## 2022-12-16 DIAGNOSIS — Z841 Family history of disorders of kidney and ureter: Secondary | ICD-10-CM | POA: Diagnosis not present

## 2022-12-16 DIAGNOSIS — F1120 Opioid dependence, uncomplicated: Secondary | ICD-10-CM | POA: Diagnosis present

## 2022-12-16 DIAGNOSIS — Z8261 Family history of arthritis: Secondary | ICD-10-CM | POA: Diagnosis not present

## 2022-12-16 DIAGNOSIS — D638 Anemia in other chronic diseases classified elsewhere: Secondary | ICD-10-CM | POA: Diagnosis present

## 2022-12-16 DIAGNOSIS — G894 Chronic pain syndrome: Secondary | ICD-10-CM | POA: Diagnosis present

## 2022-12-16 LAB — CBC WITH DIFFERENTIAL/PLATELET
Abs Immature Granulocytes: 0.04 10*3/uL (ref 0.00–0.07)
Basophils Absolute: 0 10*3/uL (ref 0.0–0.1)
Basophils Relative: 0 %
Eosinophils Absolute: 0.1 10*3/uL (ref 0.0–0.5)
Eosinophils Relative: 1 %
HCT: 32.3 % — ABNORMAL LOW (ref 36.0–46.0)
Hemoglobin: 11.2 g/dL — ABNORMAL LOW (ref 12.0–15.0)
Immature Granulocytes: 1 %
Lymphocytes Relative: 42 %
Lymphs Abs: 3.1 10*3/uL (ref 0.7–4.0)
MCH: 29.5 pg (ref 26.0–34.0)
MCHC: 34.7 g/dL (ref 30.0–36.0)
MCV: 85 fL (ref 80.0–100.0)
Monocytes Absolute: 0.8 10*3/uL (ref 0.1–1.0)
Monocytes Relative: 10 %
Neutro Abs: 3.4 10*3/uL (ref 1.7–7.7)
Neutrophils Relative %: 46 %
Platelets: 492 10*3/uL — ABNORMAL HIGH (ref 150–400)
RBC: 3.8 MIL/uL — ABNORMAL LOW (ref 3.87–5.11)
RDW: 13.5 % (ref 11.5–15.5)
WBC: 7.5 10*3/uL (ref 4.0–10.5)
nRBC: 0.4 % — ABNORMAL HIGH (ref 0.0–0.2)

## 2022-12-16 LAB — COMPREHENSIVE METABOLIC PANEL
ALT: 25 U/L (ref 0–44)
AST: 30 U/L (ref 15–41)
Albumin: 3.9 g/dL (ref 3.5–5.0)
Alkaline Phosphatase: 63 U/L (ref 38–126)
Anion gap: 10 (ref 5–15)
BUN: 13 mg/dL (ref 6–20)
CO2: 23 mmol/L (ref 22–32)
Calcium: 9 mg/dL (ref 8.9–10.3)
Chloride: 107 mmol/L (ref 98–111)
Creatinine, Ser: 0.73 mg/dL (ref 0.44–1.00)
GFR, Estimated: >60 mL/min (ref >60)
Glucose, Bld: 90 mg/dL (ref 70–99)
Potassium: 4 mmol/L (ref 3.5–5.1)
Sodium: 140 mmol/L (ref 135–145)
Total Bilirubin: 1 mg/dL (ref 0.3–1.2)
Total Protein: 8.5 g/dL — ABNORMAL HIGH (ref 6.5–8.1)

## 2022-12-16 LAB — RETICULOCYTES
Immature Retic Fract: 11.8 % (ref 2.3–15.9)
RBC.: 3.75 MIL/uL — ABNORMAL LOW (ref 3.87–5.11)
Retic Count, Absolute: 96 10*3/uL (ref 19.0–186.0)
Retic Ct Pct: 2.6 % (ref 0.4–3.1)

## 2022-12-16 MED ORDER — SODIUM CHLORIDE 0.9 % IV SOLN
12.5000 mg | Freq: Once | INTRAVENOUS | Status: AC
  Administered 2022-12-16: 12.5 mg via INTRAVENOUS
  Filled 2022-12-16: qty 12.5

## 2022-12-16 MED ORDER — DULOXETINE HCL 60 MG PO CPEP
60.0000 mg | ORAL_CAPSULE | Freq: Every day | ORAL | Status: DC
  Administered 2022-12-16 – 2022-12-20 (×5): 60 mg via ORAL
  Filled 2022-12-16 (×5): qty 1

## 2022-12-16 MED ORDER — NALOXONE HCL 0.4 MG/ML IJ SOLN: 0.4000 mg | INTRAMUSCULAR | Status: DC | PRN

## 2022-12-16 MED ORDER — HYDROMORPHONE 1 MG/ML IV SOLN
INTRAVENOUS | Status: DC
  Administered 2022-12-16: 7 mg via INTRAVENOUS
  Administered 2022-12-16: 30 mg via INTRAVENOUS
  Administered 2022-12-16: 10 mg via INTRAVENOUS
  Administered 2022-12-16: 5.5 mg via INTRAVENOUS
  Administered 2022-12-17: 6 mg via INTRAVENOUS
  Administered 2022-12-17: 30 mg via INTRAVENOUS
  Administered 2022-12-17: 1.5 mg via INTRAVENOUS
  Administered 2022-12-17: 30 mg via INTRAVENOUS
  Administered 2022-12-18: 9.5 mg via INTRAVENOUS
  Filled 2022-12-16 (×3): qty 30

## 2022-12-16 MED ORDER — DIPHENHYDRAMINE HCL 25 MG PO CAPS
25.0000 mg | ORAL_CAPSULE | ORAL | Status: DC | PRN
  Administered 2022-12-16: 25 mg via ORAL
  Filled 2022-12-16: qty 1

## 2022-12-16 MED ORDER — FOLIC ACID 1 MG PO TABS
1.0000 mg | ORAL_TABLET | Freq: Every day | ORAL | Status: DC
  Administered 2022-12-16 – 2022-12-20 (×5): 1 mg via ORAL
  Filled 2022-12-16 (×5): qty 1

## 2022-12-16 MED ORDER — SENNOSIDES-DOCUSATE SODIUM 8.6-50 MG PO TABS
1.0000 | ORAL_TABLET | Freq: Two times a day (BID) | ORAL | Status: DC
  Administered 2022-12-16 – 2022-12-20 (×8): 1 via ORAL
  Filled 2022-12-16 (×8): qty 1

## 2022-12-16 MED ORDER — KETOROLAC TROMETHAMINE 30 MG/ML IJ SOLN
15.0000 mg | Freq: Once | INTRAMUSCULAR | Status: AC
  Administered 2022-12-16: 15 mg via INTRAVENOUS
  Filled 2022-12-16: qty 1

## 2022-12-16 MED ORDER — MORPHINE SULFATE ER 15 MG PO TBCR
60.0000 mg | EXTENDED_RELEASE_TABLET | Freq: Two times a day (BID) | ORAL | Status: DC
  Administered 2022-12-16 – 2022-12-20 (×8): 60 mg via ORAL
  Filled 2022-12-16 (×8): qty 4

## 2022-12-16 MED ORDER — KETOROLAC TROMETHAMINE 15 MG/ML IJ SOLN
15.0000 mg | Freq: Four times a day (QID) | INTRAMUSCULAR | Status: DC
  Administered 2022-12-16 – 2022-12-20 (×15): 15 mg via INTRAVENOUS
  Filled 2022-12-16 (×15): qty 1

## 2022-12-16 MED ORDER — ACETAMINOPHEN 500 MG PO TABS
1000.0000 mg | ORAL_TABLET | Freq: Once | ORAL | Status: AC
  Administered 2022-12-16: 1000 mg via ORAL
  Filled 2022-12-16: qty 2

## 2022-12-16 MED ORDER — POLYETHYLENE GLYCOL 3350 17 G PO PACK: 17.0000 g | PACK | Freq: Every day | ORAL | Status: DC | PRN

## 2022-12-16 MED ORDER — SODIUM CHLORIDE 0.9% FLUSH: 9.0000 mL | INTRAVENOUS | Status: DC | PRN

## 2022-12-16 MED ORDER — ENOXAPARIN SODIUM 40 MG/0.4ML IJ SOSY
40.0000 mg | PREFILLED_SYRINGE | INTRAMUSCULAR | Status: DC
  Filled 2022-12-16 (×2): qty 0.4

## 2022-12-16 MED ORDER — SODIUM CHLORIDE 0.45 % IV SOLN: INTRAVENOUS | Status: DC

## 2022-12-16 MED ORDER — ONDANSETRON HCL 4 MG/2ML IJ SOLN
4.0000 mg | Freq: Four times a day (QID) | INTRAMUSCULAR | Status: DC | PRN
  Administered 2022-12-16 – 2022-12-20 (×2): 4 mg via INTRAVENOUS
  Filled 2022-12-16 (×2): qty 2

## 2022-12-16 NOTE — H&P (Signed)
H&P  Patient Demographics:  Carmen [name], is a 32 y.o. female  MRN: [id number]   DOB - [birth year]  Admit Date - 12/16/2022  Outpatient Primary MD for the patient is [name], FNP       HPI:   Carmen [name] is a 32 year old female with a medical history significant for sickle cell disease chronic pain syndrome, opiate dependence and tolerance, history of depression, and anemia of chronic disease presents with complaints of generalized pain that is consistent with her previous pain crisis.  Patient states the pain intensity increased 4 days ago and has been mostly unrelieved by her home medications.  She last had MS Contin and oxycodone last night without very much relief.  She states that she stopped taking her home pain medications, because it was essentially ineffective.  Patient attributes changes in weather to her current pain crisis.  Patient rates pain as 9/10,, and throbbing.  She denies any headache, chest pain, fever, chills, or shortness of breath.  No urinary symptoms, nausea, vomiting, or diarrhea.  No sick contacts, recent travel, or known exposure to COVID-19.   Sickle cell day infusion center course: Patient admitted to the sickle cell day infusion clinic for management of her pain crisis.  Reviewed all laboratory values, largely consistent with patient's baseline.  Pain persists despite IV Dilaudid PCA, Toradol, Tylenol, and IV fluids.  Patient will be admitted to [facility] for further management of her pain crisis.    Review of systems:  Review of Systems  Constitutional: Negative.   HENT: Negative.    Eyes: Negative.   Respiratory: Negative.    Cardiovascular: Negative.   Gastrointestinal: Negative.   Genitourinary: Negative.   Musculoskeletal:  Positive for back pain and joint pain.  Skin: Negative.   Neurological: Negative.   Psychiatric/Behavioral: Negative.      With Past History of the following :   Past Medical History:  Diagnosis Date    Anxiety    Headache(784.0)    Heart murmur    Sickle cell crisis ([place])    Syphilis 2015   Was diagnosed and received one injection of antibiotics   Thrombocytosis 11/22/2014     CBC Latest Ref Rng & Units 12/13/2018 12/11/2018 12/10/2018 WBC 4.0 - 10.5 K/uL 14.8(H) 13.2(H) 15.9(H) Hemoglobin 12.0 - 15.0 g/dL 8.2(L) 7.7(L) 8.1(L) Hematocrit 36.0 - 46.0 % 23.7(L) 23.2(L) 24.5(L) Platelets 150 - 400 K/uL 326 399 388        Past Surgical History:  Procedure Laterality Date   CESAREAN SECTION N/A 02/05/2019   Procedure: CESAREAN SECTION;  Surgeon: [name], MD;  Location: [facility] LD ORS;  Service: Obstetrics;  Laterality: N/A;   CHOLECYSTECTOMY N/A 11/30/2014   Procedure: LAPAROSCOPIC CHOLECYSTECTOMY SINGLE SITE WITH INTRAOPERATIVE CHOLANGIOGRAM;  Surgeon: Michael Boston, MD;  Location: [facility] ORS;  Service: General;  Laterality: N/A;   SPLENECTOMY       Social History:   Social History   Tobacco Use   Smoking status: Some Days    Types: Cigarettes   Smokeless tobacco: Never  Substance Use Topics   Alcohol use: No     Lives - At home   Family History :   Family History  Problem Relation Age of Onset   Hypertension Mother    Sickle cell anemia Sister    Kidney disease Sister        Lupus   Arthritis Sister    Sickle cell anemia Sister    Sickle cell trait Sister  Heart disease Maternal Aunt        CABG   Heart disease Maternal Uncle        CABG   Lupus Sister      Home Medications:   Prior to Admission medications   Medication Sig Start Date End Date Taking? Authorizing Provider  cyclobenzaprine (FLEXERIL) 10 MG tablet Take 1 tablet (10 mg total) by mouth at bedtime. TAKE 1 TABLET BY MOUTH DAILY AS NEEDED FOR MUSCLE SPASMS. 11/18/22  Yes [name], FNP  DULoxetine (CYMBALTA) 30 MG capsule Take 2 capsules (60 mg total) by mouth daily. 11/18/22 05/17/23 Yes [name], FNP  folic acid (FOLVITE) 1 MG tablet Take 1 tablet (1 mg total) by mouth daily. 03/15/19  Yes  Lanae Boast, FNP  morphine (MS CONTIN) 60 MG 12 hr tablet Take 1 tablet (60 mg total) by mouth every 12 (twelve) hours. 11/18/22  Yes [name], FNP  Oxycodone HCl 10 MG TABS Take 1 tablet (10 mg total) by mouth every 6 (six) hours as needed (pain). 12/03/22  Yes [name], FNP  Vitamin D, Ergocalciferol, (DRISDOL) 1.25 MG (50000 UNIT) CAPS capsule TAKE ONE CAPSULE BY MOUTH ONE TIME PER WEEK Patient taking differently: Take 50,000 Units by mouth every 7 (seven) days. saturdays 08/24/22  Yes [name], FNP  betamethasone valerate ointment (VALISONE) 0.1 % APPLY TO AFFECTED AREA TWICE A DAY Patient not taking: Reported on 05/10/2022 04/07/22   [name], FNP     Allergies:   Allergies  Allergen Reactions   Ketamine Hives, Nausea And Vomiting and Other (See Comments)    Makes the patient "feel funny" also     Physical Exam:   Vitals:   Vitals:   12/16/22 1124 12/16/22 1338  BP: 112/67 111/76  Pulse: 65 74  Resp: 15 13  Temp: 98.1 F (36.7 C)   SpO2: 100% 96%    Physical Exam: Constitutional: Patient appears well-developed and well-nourished. Not in obvious distress. HENT: Normocephalic, atraumatic, External right and left ear normal. Oropharynx is clear and moist.  Eyes: Conjunctivae and EOM are normal. PERRLA, no scleral icterus. Neck: Normal ROM. Neck supple. No JVD. No tracheal deviation. No thyromegaly. CVS: RRR, S1/S2 +, no murmurs, no gallops, no carotid bruit.  Pulmonary: Effort and breath sounds normal, no stridor, rhonchi, wheezes, rales.  Abdominal: Soft. BS +, no distension, tenderness, rebound or guarding.  Musculoskeletal: Normal range of motion. No edema and no tenderness.  Lymphadenopathy: No lymphadenopathy noted, cervical, inguinal or axillary Neuro: Alert. Normal reflexes, muscle tone coordination. No cranial nerve deficit. Skin: Skin is warm and dry. No rash noted. Not diaphoretic. No erythema. No pallor. Psychiatric: Normal mood  and affect. Behavior, judgment, thought content normal.   Data Review:   CBC Recent Labs  Lab 12/16/22 1125  WBC 7.5  HGB 11.2*  HCT 32.3*  PLT 492*  MCV 85.0  MCH 29.5  MCHC 34.7  RDW 13.5  LYMPHSABS 3.1  MONOABS 0.8  EOSABS 0.1  BASOSABS 0.0   ------------------------------------------------------------------------------------------------------------------  Chemistries  No results for input(s): "NA", "K", "CL", "CO2", "GLUCOSE", "BUN", "CREATININE", "CALCIUM", "MG", "AST", "ALT", "ALKPHOS", "BILITOT" in the last 168 hours.  Invalid input(s): "GFRCGP" ------------------------------------------------------------------------------------------------------------------ CrCl cannot be calculated (Patient's most recent lab result is older than the maximum 21 days allowed.). ------------------------------------------------------------------------------------------------------------------ No results for input(s): "TSH", "T4TOTAL", "T3FREE", "THYROIDAB" in the last 72 hours.  Invalid input(s): "FREET3"  Coagulation profile No results for input(s): "INR", "PROTIME" in the last  168 hours. ------------------------------------------------------------------------------------------------------------------- No results for input(s): "DDIMER" in the last 72 hours. -------------------------------------------------------------------------------------------------------------------  Cardiac Enzymes No results for input(s): "CKMB", "TROPONINI", "MYOGLOBIN" in the last 168 hours.  Invalid input(s): "CK" ------------------------------------------------------------------------------------------------------------------ No results found for: "BNP"  ---------------------------------------------------------------------------------------------------------------  Urinalysis    Component Value Date/Time   COLORURINE YELLOW 09/11/2022 [place] 09/11/2022 1510   LABSPEC 1.013  09/11/2022 1510   PHURINE 5.0 09/11/2022 1510   GLUCOSEU NEGATIVE 09/11/2022 1510   HGBUR NEGATIVE 09/11/2022 [place] 09/11/2022 1510   BILIRUBINUR negative 08/26/2021 1527   BILIRUBINUR Negative 08/10/2019 1534   KETONESUR NEGATIVE 09/11/2022 1510   PROTEINUR NEGATIVE 09/11/2022 1510   UROBILINOGEN 1.0 08/26/2021 1527   UROBILINOGEN 1.0 01/12/2018 0943   NITRITE NEGATIVE 09/11/2022 1510   LEUKOCYTESUR NEGATIVE 09/11/2022 1510    ----------------------------------------------------------------------------------------------------------------   Imaging Results:    No results found.   Assessment & Plan:  Principal Problem:   Sickle cell pain crisis (HCC) Active Problems:   Anemia of chronic disease   Major depressive disorder   Chronic pain syndrome  Sickle cell disease with pain crisis: Pain persists despite treatment sickle cell day infusion center.  Patient transition to [facility] for further management of this. Will continue IV Dilaudid PCA without any changes in settings IV fluids, 0.45% saline at 100 mL/h Monitor vital signs very closely, reevaluate pain scale regularly, and supplemental oxygen as needed Patient will be reevaluated for pain in the context of function and relationship to baseline as care progresses  Chronic pain syndrome: Continue MS Contin 60 mg every 12 hours Hold oxycodone, restart as pain intensity improves  Anemia of chronic disease: Hemoglobin is stable and consistent with patient's baseline.  There is no clinical indication for blood transfusion at this time.  Continue folic acid daily.  History of depression: Continue home medications.  There are no suicidal or homicidal intentions.  Monitor closely.  DVT Prophylaxis: Subcut Lovenox   AM Labs Ordered, also please review Full Orders  Family Communication: Admission, patient's condition and plan of care including tests being ordered have been discussed with the  patient who indicate understanding and agree with the plan and Code Status.  Code Status: Full Code  Consults called: None    Admission status: Inpatient    Time spent in minutes : 30 minutes   [place], MSN, FNP-C [facility] [address] [place],  [address] [phone]  12/16/2022 at 2:20 PM

## 2022-12-16 NOTE — H&P (Signed)
[facility] History and Physical   Date: 12/16/2022  Patient name: ELLYOTT MARTELLO Medical record number: [id number] Date of birth: 1990/11/20 Age: 32 y.o. Gender: female PCP: [name], FNP  Attending physician: Tresa Garter, MD  Chief Complaint: Sickle cell pain  History of Present Illness:  Vayda Fitts is a 32 year old female with a medical history significant for sickle cell disease chronic pain syndrome, opiate dependence and tolerance, history of depression, and anemia of chronic disease presents with complaints of generalized pain that is consistent with her previous pain crisis.  Patient states the pain intensity increased 4 days ago and has been mostly unrelieved by her home medications.  She last had MS Contin and oxycodone last night without very much relief.  She states that she stopped taking her home pain medications, because it was essentially ineffective.  Patient attributes changes in weather to her current pain crisis.  Patient rates pain as 9/10,, and throbbing.  She denies any headache, chest pain, fever, chills, or shortness of breath.  No urinary symptoms, nausea, vomiting, or diarrhea.  No sick contacts, recent travel, or known exposure to COVID-19.  Meds: Medications Prior to Admission  Medication Sig Dispense Refill Last Dose   betamethasone valerate ointment (VALISONE) 0.1 % APPLY TO AFFECTED AREA TWICE A DAY (Patient not taking: Reported on 05/10/2022) 30 g 1    cyclobenzaprine (FLEXERIL) 10 MG tablet Take 1 tablet (10 mg total) by mouth at bedtime. TAKE 1 TABLET BY MOUTH DAILY AS NEEDED FOR MUSCLE SPASMS. 30 tablet 2    DULoxetine (CYMBALTA) 30 MG capsule Take 2 capsules (60 mg total) by mouth daily. [id number] capsule 1    folic acid (FOLVITE) 1 MG tablet Take 1 tablet (1 mg total) by mouth daily. 90 tablet 3    morphine (MS CONTIN) 60 MG 12 hr tablet Take 1 tablet (60 mg total) by mouth every 12 (twelve) hours. 60 tablet 0    Oxycodone HCl  10 MG TABS Take 1 tablet (10 mg total) by mouth every 6 (six) hours as needed (pain). 60 tablet 0    Vitamin D, Ergocalciferol, (DRISDOL) 1.25 MG (50000 UNIT) CAPS capsule TAKE ONE CAPSULE BY MOUTH ONE TIME PER WEEK (Patient taking differently: Take 50,000 Units by mouth every 7 (seven) days. saturdays) 30 capsule 1     Allergies: Ketamine Past Medical History:  Diagnosis Date   Anxiety    Headache(784.0)    Heart murmur    Sickle cell crisis ([place])    Syphilis 2015   Was diagnosed and received one injection of antibiotics   Thrombocytosis 11/22/2014     CBC Latest Ref Rng & Units 12/13/2018 12/11/2018 12/10/2018 WBC 4.0 - 10.5 K/uL 14.8(H) 13.2(H) 15.9(H) Hemoglobin 12.0 - 15.0 g/dL 8.2(L) 7.7(L) 8.1(L) Hematocrit 36.0 - 46.0 % 23.7(L) 23.2(L) 24.5(L) Platelets 150 - 400 K/uL 326 399 388     Past Surgical History:  Procedure Laterality Date   CESAREAN SECTION N/A 02/05/2019   Procedure: CESAREAN SECTION;  Surgeon: [name], MD;  Location: [facility] LD ORS;  Service: Obstetrics;  Laterality: N/A;   CHOLECYSTECTOMY N/A 11/30/2014   Procedure: LAPAROSCOPIC CHOLECYSTECTOMY SINGLE SITE WITH INTRAOPERATIVE CHOLANGIOGRAM;  Surgeon: Michael Boston, MD;  Location: [facility] ORS;  Service: General;  Laterality: N/A;   SPLENECTOMY     Family History  Problem Relation Age of Onset   Hypertension Mother    Sickle cell anemia Sister    Kidney disease Sister  Lupus   Arthritis Sister    Sickle cell anemia Sister    Sickle cell trait Sister    Heart disease Maternal Aunt        CABG   Heart disease Maternal Uncle        CABG   Lupus Sister    Social History   Socioeconomic History   Marital status: Single    Spouse name: Not on file   Number of children: Not on file   Years of education: Not on file   Highest education level: Not on file  Occupational History   Not on file  Tobacco Use   Smoking status: Some Days    Types: Cigarettes   Smokeless tobacco: Never  Vaping Use   Vaping Use:  Never used  Substance and Sexual Activity   Alcohol use: No   Drug use: No   Sexual activity: Yes    Birth control/protection: Injection  Other Topics Concern   Not on file  Social History Narrative   Not on file   Social Determinants of Health   Financial Resource Strain: Not on file  Food Insecurity: No Food Insecurity (11/15/2022)   Hunger Vital Sign    Worried About Running Out of Food in the Last Year: Never true    Ran Out of Food in the Last Year: Never true  Transportation Needs: Unmet Transportation Needs (11/23/2022)   PRAPARE - Hydrologist (Medical): Yes    Lack of Transportation (Non-Medical): No  Physical Activity: Not on file  Stress: Not on file  Social Connections: Not on file  Intimate Partner Violence: Not At Risk (11/15/2022)   Humiliation, Afraid, Rape, and Kick questionnaire    Fear of Current or Ex-Partner: No    Emotionally Abused: No    Physically Abused: No    Sexually Abused: No   Review of Systems  Constitutional: Negative.   HENT: Negative.    Eyes: Negative.   Respiratory: Negative.    Cardiovascular: Negative.   Gastrointestinal: Negative.   Genitourinary: Negative.   Musculoskeletal:  Positive for back pain and joint pain.  Skin: Negative.   Neurological: Negative.   Endo/Heme/Allergies: Negative.   Psychiatric/Behavioral: Negative.      Physical Exam: Last menstrual period 11/14/2022. Physical Exam Constitutional:      Appearance: Normal appearance. She is obese.  Eyes:     Pupils: Pupils are equal, round, and reactive to light.  Cardiovascular:     Rate and Rhythm: Normal rate and regular rhythm.     Pulses: Normal pulses.  Pulmonary:     Effort: Pulmonary effort is normal.  Abdominal:     General: Bowel sounds are normal.  Musculoskeletal:        General: Normal range of motion.  Skin:    General: Skin is warm.  Neurological:     General: No focal deficit present.     Mental Status: She is  alert. Mental status is at baseline.  Psychiatric:        Mood and Affect: Mood normal.        Behavior: Behavior normal.        Thought Content: Thought content normal.        Judgment: Judgment normal.      Lab results: No results found for this or any previous visit (from the past 24 hour(s)).  Imaging results:  No results found.   Assessment & Plan: Patient admitted to sickle cell day infusion center  for management of pain crisis.  Patient is opiate tolerant Initiate IV dilaudid PCA.  IV fluids, 0.45% saline at 100 ml/hr Toradol 15 mg IV times one dose Tylenol 1000 mg by mouth times one dose Review CBC with differential, complete metabolic panel, and reticulocytes as results become available. Pain intensity will be reevaluated in context of functioning and relationship to baseline as care progresses If pain intensity remains elevated and/or sudden change in hemodynamic stability transition to inpatient services for higher level of care.      [name]  APRN, MSN, FNP-C [facility] [address] [place], [place] [address] [phone]  12/16/2022, 9:22 AM

## 2022-12-16 NOTE — Telephone Encounter (Signed)
Pt called day hospital looking to come in today for sickle cell pain treatment. Pt reports 10/10 pain to shoulders and hips. Pt states that she last took pain medication at 730 PM last night, and it was Morphine 60 mg. Pt states that she still has pain medication, but it hasn't been giving her pain relief, so she has not taken it this morning. Pt denies being to ER. Pt screened for COVID and denies exposures and symptoms. Pt denies fever, chest pain, N/V/D, and abdominal pain. [place], [place] notified and she said pt can come to day hospital today. Pt states that she will use [employer] as her transportation today.

## 2022-12-16 NOTE — Progress Notes (Signed)
Patient admitted to the day infusion hospital for sickle cell pain. Initially, patient reported bilateral hip and shoulder pain rated 10/10. For pan management, patient placed on Sickle Cell Dilaudid PCA, given IV Toradol, Tylenol and hydrated with IV fluids. Patient's pain remained elevated at 8/10. Provider notified and placed order for admission to inpatient unit for continued pain management. Report called to [place], RN on [address]. Patient transferred to [place] in wheelchair on PCA (settings 0.5/10/3 verified with Marnette Burgess, RN prior to transfer). Patient alert, oriented and ambulatory at discharge.

## 2022-12-16 NOTE — TOC Progression Note (Signed)
Transition of Care [facility]) - Progression Note    Patient Details  Name: Carmen [name] MRN: [id number] Date of Birth: [birth year]  Transition of Care [facility]) CM/SW [place], RN Phone Number:[phone]  12/16/2022, 4:22 PM  Clinical Narrative:     Transition of Care [facility]) Screening Note   Patient Details  Name: Carmen [name] Date of Birth: 08/26/1991   Transition of Care [facility]) CM/SW Contact:    Angelita Ingles, RN Phone Number: 12/16/2022, 4:22 PM    Transition of Care Department [facility]) has reviewed patient and no TOC needs have been identified at this time. We will continue to monitor patient advancement through interdisciplinary progression rounds. If new patient transition needs arise, please place a TOC consult.          Expected Discharge Plan and Services                                               Social Determinants of Health (SDOH) Interventions SDOH Screenings   Food Insecurity: No Food Insecurity (11/15/2022)  Housing: Low Risk  (11/15/2022)  Transportation Needs: Unmet Transportation Needs (11/23/2022)  Utilities: Not At Risk (11/15/2022)  Depression (PHQ2-9): High Risk (11/23/2022)  Tobacco Use: High Risk (11/23/2022)    Readmission Risk Interventions    09/13/2022    2:06 PM 09/13/2022    9:03 AM 08/16/2022    2:56 PM  Readmission Risk Prevention Plan  Transportation Screening Complete Complete Complete  Medication Review Press photographer)  Complete Complete  PCP or Specialist appointment within 3-5 days of discharge  Complete Complete  HRI or [place]  Complete Complete  SW Recovery Care/Counseling Consult  Complete Complete  Palliative Care Screening  Not Applicable Not [place]  Not Applicable Not Applicable

## 2022-12-17 DIAGNOSIS — D5700 Hb-SS disease with crisis, unspecified: Secondary | ICD-10-CM | POA: Diagnosis not present

## 2022-12-17 LAB — CBC
HCT: 27.6 % — ABNORMAL LOW (ref 36.0–46.0)
Hemoglobin: 9.8 g/dL — ABNORMAL LOW (ref 12.0–15.0)
MCH: 29.9 pg (ref 26.0–34.0)
MCHC: 35.5 g/dL (ref 30.0–36.0)
MCV: 84.1 fL (ref 80.0–100.0)
Platelets: 398 10*3/uL (ref 150–400)
RBC: 3.28 MIL/uL — ABNORMAL LOW (ref 3.87–5.11)
RDW: 13.4 % (ref 11.5–15.5)
WBC: 9.4 10*3/uL (ref 4.0–10.5)
nRBC: 0 % (ref 0.0–0.2)

## 2022-12-17 LAB — BASIC METABOLIC PANEL
Anion gap: 9 (ref 5–15)
BUN: 16 mg/dL (ref 6–20)
CO2: 22 mmol/L (ref 22–32)
Calcium: 9 mg/dL (ref 8.9–10.3)
Chloride: 104 mmol/L (ref 98–111)
Creatinine, Ser: 0.66 mg/dL (ref 0.44–1.00)
GFR, Estimated: >60 mL/min (ref >60)
Glucose, Bld: 98 mg/dL (ref 70–99)
Potassium: 3.5 mmol/L (ref 3.5–5.1)
Sodium: 135 mmol/L (ref 135–145)

## 2022-12-17 NOTE — Progress Notes (Signed)
Subjective: Carmen [name] is a 32 year old female with a medical history significant for sickle cell disease, chronic pain syndrome, major depression, opiate dependence and tolerance, and anemia of chronic disease that was admitted for sickle cell pain crisis.  Patient states that pain intensity did not improve very much overnight.  She rates pain as 9/10 primarily to shoulders and mid back.  Also, patient endorses some lower extremity pain.  She denies any headache, chest pain, shortness of breath, urinary symptoms, nausea, vomiting, or diarrhea.   Objective:  Vital signs in last 24 hours:  Vitals:   12/17/22 0427 12/17/22 0430 12/17/22 1412 12/17/22 1532  BP:  125/72 128/78   Pulse:  90 65   Resp: 16 14 20 18  $ Temp:  98.5 F (36.9 C) 98.2 F (36.8 C)   TempSrc:  Oral Oral   SpO2: 92% 100% 99% 98%    Intake/Output from previous day:   Intake/Output Summary (Last 24 hours) at 12/17/2022 1844 Last data filed at 12/17/2022 0000 Gross per 24 hour  Intake 1204.94 ml  Output --  Net 1204.94 ml    Physical Exam: General: Alert, awake, oriented x3, in no acute distress.  HEENT: [place]/AT PEERL, EOMI Neck: Trachea midline,  no masses, no thyromegal,y no JVD, no carotid bruit OROPHARYNX:  Moist, No exudate/ erythema/lesions.  Heart: Regular rate and rhythm, without murmurs, rubs, gallops, PMI non-displaced, no heaves or thrills on palpation.  Lungs: Clear to auscultation, no wheezing or rhonchi noted. No increased vocal fremitus resonant to percussion  Abdomen: Soft, nontender, nondistended, positive bowel sounds, no masses no hepatosplenomegaly noted..  Neuro: No focal neurological deficits noted cranial nerves II through XII grossly intact. DTRs 2+ bilaterally upper and lower extremities. Strength 5 out of 5 in bilateral upper and lower extremities. Musculoskeletal: No warm swelling or erythema around joints, no spinal tenderness noted. Psychiatric: Patient alert and oriented x3, good  insight and cognition, good recent to remote recall. Lymph node survey: No cervical axillary or inguinal lymphadenopathy noted.  Lab Results:  Basic Metabolic Panel:    Component Value Date/Time   NA 135 12/17/2022 0638   NA 142 08/24/2022 1213   K 3.5 12/17/2022 0638   CL 104 12/17/2022 0638   CO2 22 12/17/2022 0638   BUN 16 12/17/2022 0638   BUN 10 08/24/2022 1213   CREATININE 0.66 12/17/2022 0638   GLUCOSE 98 12/17/2022 0638   CALCIUM 9.0 12/17/2022 0638   CBC:    Component Value Date/Time   WBC 9.4 12/17/2022 0638   HGB 9.8 (L) 12/17/2022 0638   HGB 9.7 (L) 08/24/2022 1213   HCT 27.6 (L) 12/17/2022 0638   HCT 30.1 (L) 08/24/2022 1213   PLT 398 12/17/2022 0638   PLT 477 (H) 08/24/2022 1213   MCV 84.1 12/17/2022 0638   MCV 88 08/24/2022 1213   NEUTROABS 3.4 12/16/2022 1125   NEUTROABS 3.3 08/24/2022 1213   LYMPHSABS 3.1 12/16/2022 1125   LYMPHSABS 2.8 08/24/2022 1213   MONOABS 0.8 12/16/2022 1125   EOSABS 0.1 12/16/2022 1125   EOSABS 0.2 08/24/2022 1213   BASOSABS 0.0 12/16/2022 1125   BASOSABS 0.1 08/24/2022 1213    No results found for this or any previous visit (from the past 240 hour(s)).  Studies/Results: No results found.  Medications: Scheduled Meds:  DULoxetine  60 mg Oral Daily   enoxaparin (LOVENOX) injection  40 mg Subcutaneous [id number]   folic acid  1 mg Oral Daily   HYDROmorphone   Intravenous Q4H  ketorolac  15 mg Intravenous Q6H   morphine  60 mg Oral Q12H   senna-docusate  1 tablet Oral BID   Continuous Infusions:  sodium chloride 50 mL/hr at 12/17/22 1715   PRN Meds:.diphenhydrAMINE, naloxone **AND** sodium chloride flush, ondansetron (ZOFRAN) IV, polyethylene glycol  Consultants: none  Procedures: none  Antibiotics: none  Assessment/Plan: Principal Problem:   Sickle cell pain crisis (HCC) Active Problems:   Anemia of chronic disease   Major depressive disorder   Chronic pain syndrome Sickle cell disease with pain  crisis: Continue IV Dilaudid PCA without changes MS Contin 60 mg every 12 hours Restart oxycodone 10 mg every 6 hours as needed for severe breakthrough pain Monitor vital signs very closely, reevaluate pain scale regularly, and supplemental oxygen as needed  Anemia of chronic disease: Patient's hemoglobin is stable and consistent with her baseline, there is no clinical indication for blood transfusion at this time.  Chronic pain syndrome: Continue home medications  Major depression: Stable.  Continue home medications.  No suicidal homicidal intentions.  Monitor closely.  Sickle cell disease with pain Code Status: Full Code Family Communication: N/A Disposition Plan: Not yet ready for discharge  [place], MSN, FNP-C [facility] [address] [place], [place] [address] [phone]  If 7PM-7AM, please contact night-coverage.  12/17/2022, 6:44 PM  LOS: 1 day

## 2022-12-18 DIAGNOSIS — D5700 Hb-SS disease with crisis, unspecified: Secondary | ICD-10-CM | POA: Diagnosis not present

## 2022-12-18 MED ORDER — OXYCODONE HCL 5 MG PO TABS
10.0000 mg | ORAL_TABLET | Freq: Four times a day (QID) | ORAL | Status: DC | PRN
  Administered 2022-12-18 – 2022-12-20 (×5): 10 mg via ORAL
  Filled 2022-12-18 (×5): qty 2

## 2022-12-18 MED ORDER — HYDROMORPHONE 1 MG/ML IV SOLN
INTRAVENOUS | Status: DC
  Administered 2022-12-18 – 2022-12-19 (×2): 30 mg via INTRAVENOUS
  Administered 2022-12-19: 3.6 mg via INTRAVENOUS
  Administered 2022-12-20: 30 mg via INTRAVENOUS
  Administered 2022-12-20: 7.5 mg via INTRAVENOUS
  Filled 2022-12-18 (×4): qty 30

## 2022-12-18 NOTE — Progress Notes (Signed)
Patient ID: Carmen [name], female   DOB: December 24, 1990, 32 y.o.   MRN: [id number] Subjective: Carmen [name] is a 32 year old female with a medical history significant for sickle cell disease, chronic pain syndrome, major depression, opiate dependence and tolerance, and anemia of chronic disease that was admitted for sickle cell pain crisis.   Patient continues to endorse significant pain especially in her upper shoulder and upper back area.  She said the pain in her hip joints and lower extremities has improved a little since yesterday.  Pain in her shoulder area is still about 8/10.  She denies any fever, headache, cough, chest pain, shortness of breath, nausea, vomiting or diarrhea.  No urinary symptoms.  Objective:  Vital signs in last 24 hours:  Vitals:   12/17/22 2005 12/18/22 0511 12/18/22 0744 12/18/22 1117  BP: 101/81 108/69  115/75  Pulse: 61 69  77  Resp: 15 16 14 16  $ Temp: 97.9 F (36.6 C) 98.1 F (36.7 C)  98.5 F (36.9 C)  TempSrc: Oral Oral  Oral  SpO2: 98% 96% 97% 95%    Intake/Output from previous day:   Intake/Output Summary (Last 24 hours) at 12/18/2022 1302 Last data filed at 12/17/2022 2000 Gross per 24 hour  Intake 2064.48 ml  Output --  Net 2064.48 ml    Physical Exam: General: Alert, awake, oriented x3, in no acute distress.  HEENT: [place]/AT PEERL, EOMI Neck: Trachea midline,  no masses, no thyromegal,y no JVD, no carotid bruit OROPHARYNX:  Moist, No exudate/ erythema/lesions.  Heart: Regular rate and rhythm, without murmurs, rubs, gallops, PMI non-displaced, no heaves or thrills on palpation.  Lungs: Clear to auscultation, no wheezing or rhonchi noted. No increased vocal fremitus resonant to percussion  Abdomen: Soft, nontender, nondistended, positive bowel sounds, no masses no hepatosplenomegaly noted..  Neuro: No focal neurological deficits noted cranial nerves II through XII grossly intact. DTRs 2+ bilaterally upper and lower extremities. Strength 5 out of 5  in bilateral upper and lower extremities. Musculoskeletal: No warm swelling or erythema around joints, no spinal tenderness noted. Psychiatric: Patient alert and oriented x3, good insight and cognition, good recent to remote recall. Lymph node survey: No cervical axillary or inguinal lymphadenopathy noted.  Lab Results:  Basic Metabolic Panel:    Component Value Date/Time   NA 135 12/17/2022 0638   NA 142 08/24/2022 1213   K 3.5 12/17/2022 0638   CL 104 12/17/2022 0638   CO2 22 12/17/2022 0638   BUN 16 12/17/2022 0638   BUN 10 08/24/2022 1213   CREATININE 0.66 12/17/2022 0638   GLUCOSE 98 12/17/2022 0638   CALCIUM 9.0 12/17/2022 0638   CBC:    Component Value Date/Time   WBC 9.4 12/17/2022 0638   HGB 9.8 (L) 12/17/2022 0638   HGB 9.7 (L) 08/24/2022 1213   HCT 27.6 (L) 12/17/2022 0638   HCT 30.1 (L) 08/24/2022 1213   PLT 398 12/17/2022 0638   PLT 477 (H) 08/24/2022 1213   MCV 84.1 12/17/2022 0638   MCV 88 08/24/2022 1213   NEUTROABS 3.4 12/16/2022 1125   NEUTROABS 3.3 08/24/2022 1213   LYMPHSABS 3.1 12/16/2022 1125   LYMPHSABS 2.8 08/24/2022 1213   MONOABS 0.8 12/16/2022 1125   EOSABS 0.1 12/16/2022 1125   EOSABS 0.2 08/24/2022 1213   BASOSABS 0.0 12/16/2022 1125   BASOSABS 0.1 08/24/2022 1213    No results found for this or any previous visit (from the past 240 hour(s)).  Studies/Results: No results found.  Medications: Scheduled Meds:  DULoxetine  60 mg Oral Daily   enoxaparin (LOVENOX) injection  40 mg Subcutaneous [id number]   folic acid  1 mg Oral Daily   HYDROmorphone   Intravenous Q4H   ketorolac  15 mg Intravenous Q6H   morphine  60 mg Oral Q12H   senna-docusate  1 tablet Oral BID   Continuous Infusions:  sodium chloride 50 mL/hr at 12/18/22 0756   PRN Meds:.diphenhydrAMINE, naloxone **AND** sodium chloride flush, ondansetron (ZOFRAN) IV, Oxycodone HCl, polyethylene  glycol  Consultants: None  Procedures: None  Antibiotics: None  Assessment/Plan: Principal Problem:   Sickle cell pain crisis (HCC) Active Problems:   Anemia of chronic disease   Major depressive disorder   Chronic pain syndrome  Hb Sickle Cell Disease with Pain crisis: Reduce IVF 0 .45% Saline to KVO, increase weight based Dilaudid PCA setting to 0.6/10/4.2, restart oral oxycodone, continue MS Contin as ordered.  Continue IV Toradol 15 mg Q 6 H for total of 5 days, Monitor vitals very closely, Re-evaluate pain scale regularly, 2 L of Oxygen by [place].  Incentive spirometry encouraged Anemia of Chronic Disease: Hemoglobin remained stable at baseline today.  There is no clinical indication for blood transfusion at this time.  Will monitor closely and transfuse as appropriate. Chronic pain Syndrome: Continue home medications as ordered. Major depressive disorder: Clinically stable.  Patient denies any suicidal ideations or thoughts.  Will continue her home medications.  Code Status: Full Code Family Communication: N/A Disposition Plan: Not yet ready for discharge  Carmen [name]  If 7PM-7AM, please contact night-coverage.  12/18/2022, 1:02 PM  LOS: 2 days

## 2022-12-18 NOTE — Plan of Care (Signed)
Patient AOX4, VSS throughout shift.  All meds given on time as ordered.  Diminished lungs, IS encouraged.  Pt ambulated with steady gait to bathroom.  Dilaudid PCA on-going.  Skin intact.  POC maintained, will continue to monitor.  Problem: Education: Goal: Knowledge of vaso-occlusive preventative measures will improve Outcome: Progressing Goal: Awareness of infection prevention will improve Outcome: Progressing Goal: Awareness of signs and symptoms of anemia will improve Outcome: Progressing Goal: Long-term complications will improve Outcome: Progressing   Problem: Self-Care: Goal: Ability to incorporate actions that prevent/reduce pain crisis will improve Outcome: Progressing   Problem: Bowel/Gastric: Goal: Gut motility will be maintained Outcome: Progressing   Problem: Tissue Perfusion: Goal: Complications related to inadequate tissue perfusion will be avoided or minimized Outcome: Progressing   Problem: Respiratory: Goal: Pulmonary complications will be avoided or minimized Outcome: Progressing Goal: Acute Chest Syndrome will be identified early to prevent complications Outcome: Progressing   Problem: Fluid Volume: Goal: Ability to maintain a balanced intake and output will improve Outcome: Progressing   Problem: Sensory: Goal: Pain level will decrease with appropriate interventions Outcome: Progressing   Problem: Health Behavior: Goal: Postive changes in compliance with treatment and prescription regimens will improve Outcome: Progressing   Problem: Education: Goal: Knowledge of General Education information will improve Description: Including pain rating scale, medication(s)/side effects and non-pharmacologic comfort measures Outcome: Progressing   Problem: Health Behavior/Discharge Planning: Goal: Ability to manage health-related needs will improve Outcome: Progressing   Problem: Clinical Measurements: Goal: Ability to maintain clinical measurements within  normal limits will improve Outcome: Progressing Goal: Will remain free from infection Outcome: Progressing Goal: Diagnostic test results will improve Outcome: Progressing Goal: Respiratory complications will improve Outcome: Progressing Goal: Cardiovascular complication will be avoided Outcome: Progressing   Problem: Activity: Goal: Risk for activity intolerance will decrease Outcome: Progressing   Problem: Nutrition: Goal: Adequate nutrition will be maintained Outcome: Progressing   Problem: Coping: Goal: Level of anxiety will decrease Outcome: Progressing   Problem: Elimination: Goal: Will not experience complications related to bowel motility Outcome: Progressing Goal: Will not experience complications related to urinary retention Outcome: Progressing   Problem: Pain Managment: Goal: General experience of comfort will improve Outcome: Progressing   Problem: Safety: Goal: Ability to remain free from injury will improve Outcome: Progressing   Problem: Skin Integrity: Goal: Risk for impaired skin integrity will decrease Outcome: Progressing

## 2022-12-19 DIAGNOSIS — D5700 Hb-SS disease with crisis, unspecified: Secondary | ICD-10-CM | POA: Diagnosis not present

## 2022-12-19 LAB — CBC WITH DIFFERENTIAL/PLATELET
Abs Immature Granulocytes: 0.03 10*3/uL (ref 0.00–0.07)
Basophils Absolute: 0 10*3/uL (ref 0.0–0.1)
Basophils Relative: 0 %
Eosinophils Absolute: 0.5 10*3/uL (ref 0.0–0.5)
Eosinophils Relative: 5 %
HCT: 27.8 % — ABNORMAL LOW (ref 36.0–46.0)
Hemoglobin: 10.1 g/dL — ABNORMAL LOW (ref 12.0–15.0)
Immature Granulocytes: 0 %
Lymphocytes Relative: 39 %
Lymphs Abs: 4 10*3/uL (ref 0.7–4.0)
MCH: 29.2 pg (ref 26.0–34.0)
MCHC: 36.3 g/dL — ABNORMAL HIGH (ref 30.0–36.0)
MCV: 80.3 fL (ref 80.0–100.0)
Monocytes Absolute: 1.3 10*3/uL — ABNORMAL HIGH (ref 0.1–1.0)
Monocytes Relative: 13 %
Neutro Abs: 4.4 10*3/uL (ref 1.7–7.7)
Neutrophils Relative %: 43 %
Platelets: 345 10*3/uL (ref 150–400)
RBC: 3.46 MIL/uL — ABNORMAL LOW (ref 3.87–5.11)
RDW: 14.5 % (ref 11.5–15.5)
WBC: 10.2 10*3/uL (ref 4.0–10.5)
nRBC: 0.2 % (ref 0.0–0.2)

## 2022-12-19 NOTE — Plan of Care (Signed)
Patient AOX4, VSS throughout shift. All meds given on time as ordered. Diminished lungs, IS encouraged. Pt ambulated with steady gait to bathroom. Dilaudid PCA on-going. Skin intact. POC maintained, will continue to monitor.   Problem: Education: Goal: Knowledge of vaso-occlusive preventative measures will improve Outcome: Progressing Goal: Awareness of infection prevention will improve Outcome: Progressing Goal: Awareness of signs and symptoms of anemia will improve Outcome: Progressing Goal: Long-term complications will improve Outcome: Progressing   Problem: Self-Care: Goal: Ability to incorporate actions that prevent/reduce pain crisis will improve Outcome: Progressing   Problem: Bowel/Gastric: Goal: Gut motility will be maintained Outcome: Progressing   Problem: Tissue Perfusion: Goal: Complications related to inadequate tissue perfusion will be avoided or minimized Outcome: Progressing   Problem: Respiratory: Goal: Pulmonary complications will be avoided or minimized Outcome: Progressing Goal: Acute Chest Syndrome will be identified early to prevent complications Outcome: Progressing   Problem: Fluid Volume: Goal: Ability to maintain a balanced intake and output will improve Outcome: Progressing   Problem: Sensory: Goal: Pain level will decrease with appropriate interventions Outcome: Progressing   Problem: Health Behavior: Goal: Postive changes in compliance with treatment and prescription regimens will improve Outcome: Progressing   Problem: Education: Goal: Knowledge of General Education information will improve Description: Including pain rating scale, medication(s)/side effects and non-pharmacologic comfort measures Outcome: Progressing   Problem: Health Behavior/Discharge Planning: Goal: Ability to manage health-related needs will improve Outcome: Progressing   Problem: Clinical Measurements: Goal: Ability to maintain clinical measurements within normal  limits will improve Outcome: Progressing Goal: Will remain free from infection Outcome: Progressing Goal: Diagnostic test results will improve Outcome: Progressing Goal: Respiratory complications will improve Outcome: Progressing Goal: Cardiovascular complication will be avoided Outcome: Progressing   Problem: Activity: Goal: Risk for activity intolerance will decrease Outcome: Progressing   Problem: Nutrition: Goal: Adequate nutrition will be maintained Outcome: Progressing   Problem: Coping: Goal: Level of anxiety will decrease Outcome: Progressing   Problem: Elimination: Goal: Will not experience complications related to bowel motility Outcome: Progressing Goal: Will not experience complications related to urinary retention Outcome: Progressing   Problem: Pain Managment: Goal: General experience of comfort will improve Outcome: Progressing   Problem: Safety: Goal: Ability to remain free from injury will improve Outcome: Progressing   Problem: Skin Integrity: Goal: Risk for impaired skin integrity will decrease Outcome: Progressing

## 2022-12-19 NOTE — Progress Notes (Signed)
Patient ID: MEDDIE VENNEMAN, female   DOB: 1991/10/07, 32 y.o.   MRN: [id number] Subjective: Nechama Boehm is a 32 year old female with a medical history significant for sickle cell disease, chronic pain syndrome, major depression, opiate dependence and tolerance, and anemia of chronic disease that was admitted for sickle cell pain crisis.   Patient has no new complaint today.  She said her pain is much improved as compared to yesterday.  She rates her pain at between 6 and 7/10.  Her goal is pain intensity below 5/10. She denies any fever, cough, chest pain, shortness of breath, nausea, vomiting or diarrhea.  No urinary symptoms.  Objective:  Vital signs in last 24 hours:  Vitals:   12/18/22 2359 12/19/22 0424 12/19/22 0937 12/19/22 0945  BP: 131/78 130/75  (!) 111/92  Pulse: 95 93  88  Resp: 14 16 16 14  $ Temp: 98.5 F (36.9 C) 98.4 F (36.9 C)  98 F (36.7 C)  TempSrc: Oral Oral  Oral  SpO2: 96% 94% 96% 94%    Intake/Output from previous day:   Intake/Output Summary (Last 24 hours) at 12/19/2022 1323 Last data filed at 12/18/2022 2200 Gross per 24 hour  Intake 911.9 ml  Output --  Net 911.9 ml     Physical Exam: General: Alert, awake, oriented x3, in no acute distress.  HEENT: [place]/AT PEERL, EOMI Neck: Trachea midline,  no masses, no thyromegal,y no JVD, no carotid bruit OROPHARYNX:  Moist, No exudate/ erythema/lesions.  Heart: Regular rate and rhythm, without murmurs, rubs, gallops, PMI non-displaced, no heaves or thrills on palpation.  Lungs: Clear to auscultation, no wheezing or rhonchi noted. No increased vocal fremitus resonant to percussion  Abdomen: Soft, nontender, nondistended, positive bowel sounds, no masses no hepatosplenomegaly noted..  Neuro: No focal neurological deficits noted cranial nerves II through XII grossly intact. DTRs 2+ bilaterally upper and lower extremities. Strength 5 out of 5 in bilateral upper and lower extremities. Musculoskeletal: No warm  swelling or erythema around joints, no spinal tenderness noted. Psychiatric: Patient alert and oriented x3, good insight and cognition, good recent to remote recall. Lymph node survey: No cervical axillary or inguinal lymphadenopathy noted.  Lab Results:  Basic Metabolic Panel:    Component Value Date/Time   NA 135 12/17/2022 0638   NA 142 08/24/2022 1213   K 3.5 12/17/2022 0638   CL 104 12/17/2022 0638   CO2 22 12/17/2022 0638   BUN 16 12/17/2022 0638   BUN 10 08/24/2022 1213   CREATININE 0.66 12/17/2022 0638   GLUCOSE 98 12/17/2022 0638   CALCIUM 9.0 12/17/2022 0638   CBC:    Component Value Date/Time   WBC 10.2 12/19/2022 0625   HGB 10.1 (L) 12/19/2022 0625   HGB 9.7 (L) 08/24/2022 1213   HCT 27.8 (L) 12/19/2022 0625   HCT 30.1 (L) 08/24/2022 1213   PLT 345 12/19/2022 0625   PLT 477 (H) 08/24/2022 1213   MCV 80.3 12/19/2022 0625   MCV 88 08/24/2022 1213   NEUTROABS 4.4 12/19/2022 0625   NEUTROABS 3.3 08/24/2022 1213   LYMPHSABS 4.0 12/19/2022 0625   LYMPHSABS 2.8 08/24/2022 1213   MONOABS 1.3 (H) 12/19/2022 0625   EOSABS 0.5 12/19/2022 0625   EOSABS 0.2 08/24/2022 1213   BASOSABS 0.0 12/19/2022 0625   BASOSABS 0.1 08/24/2022 1213    No results found for this or any previous visit (from the past 240 hour(s)).  Studies/Results: No results found.  Medications: Scheduled Meds:  DULoxetine  60 mg Oral  Daily   enoxaparin (LOVENOX) injection  40 mg Subcutaneous [id number]   folic acid  1 mg Oral Daily   HYDROmorphone   Intravenous Q4H   ketorolac  15 mg Intravenous Q6H   morphine  60 mg Oral Q12H   senna-docusate  1 tablet Oral BID   Continuous Infusions:  sodium chloride Stopped (12/18/22 1847)   PRN Meds:.diphenhydrAMINE, naloxone **AND** sodium chloride flush, ondansetron (ZOFRAN) IV, oxyCODONE, polyethylene glycol  Consultants: None  Procedures: None  Antibiotics: None  Assessment/Plan: Principal Problem:   Sickle cell pain crisis ([place]) Active  Problems:   Anemia of chronic disease   Major depressive disorder   Chronic pain syndrome  Hb Sickle Cell Disease with Pain crisis: Continue IVF at [facility], continue weight based Dilaudid PCA at current setting, continue oral pain medications as ordered.  Continue IV Toradol 15 mg Q 6 H for total of 5 days, Monitor vitals very closely, Re-evaluate pain scale regularly, 2 L of Oxygen by [place].  Incentive spirometry encouraged Anemia of Chronic Disease: Hemoglobin remained stable at baseline today.  There is no clinical indication for blood transfusion at this time. Will monitor closely and transfuse as appropriate. Chronic pain Syndrome: Continue home medications as ordered. Major depressive disorder: Clinically stable. Patient denies any suicidal ideations or thoughts. Will continue her home medications.  Code Status: Full Code Family Communication: N/A Disposition Plan: Discharge home tomorrow morning 12/20/2022  Nidia Grogan  If 7PM-7AM, please contact night-coverage.  12/19/2022, 1:23 PM  LOS: 3 days

## 2022-12-20 DIAGNOSIS — D5700 Hb-SS disease with crisis, unspecified: Secondary | ICD-10-CM | POA: Diagnosis not present

## 2022-12-20 MED ORDER — OXYCODONE HCL 10 MG PO TABS: 10.0000 mg | ORAL_TABLET | Freq: Four times a day (QID) | ORAL | 0 refills | Status: DC | PRN

## 2022-12-20 MED ORDER — MORPHINE SULFATE ER 60 MG PO TBCR: 60.0000 mg | EXTENDED_RELEASE_TABLET | Freq: Two times a day (BID) | ORAL | 0 refills | Status: DC

## 2022-12-20 NOTE — Discharge Summary (Signed)
Physician Discharge Summary  Carmen [name] [id number] DOB: Mar 16, 1991 DOA: 12/16/2022  PCP: [name], FNP  Admit date: 12/16/2022  Discharge date: 12/20/2022  Discharge Diagnoses:  Principal Problem:   Sickle cell pain crisis ([place]) Active Problems:   Anemia of chronic disease   Major depressive disorder   Chronic pain syndrome   Discharge Condition: Stable  Disposition:   Follow-up Information     [name], FNP Follow up.   Specialty: Family Medicine Contact information: [address] [place] [id number] [phone]                Pt is discharged home in good condition and is to follow up with [name], FNP this week to have labs evaluated. Carmen [name] is instructed to increase activity slowly and balance with rest for the next few days, and use prescribed medication to complete treatment of pain  Diet: Regular Wt Readings from Last 3 Encounters:  11/23/22 87.6 kg  11/14/22 86.2 kg  09/12/22 88.8 kg    History of present illness:  Carmen [name] is a 32 year old female with a medical history significant for sickle cell disease chronic pain syndrome, opiate dependence and tolerance, history of depression, and anemia of chronic disease presents with complaints of generalized pain that is consistent with her previous pain crisis.  Patient states the pain intensity increased 4 days ago and has been mostly unrelieved by her home medications.  She last had MS Contin and oxycodone last night without very much relief.  She states that she stopped taking her home pain medications, because it was essentially ineffective.  Patient attributes changes in weather to her current pain crisis.  Patient rates pain as 9/10,, and throbbing.  She denies any headache, chest pain, fever, chills, or shortness of breath.  No urinary symptoms, nausea, vomiting, or diarrhea.  No sick contacts, recent travel, or known exposure to COVID-19.    Sickle  cell day infusion center course: Patient admitted to the sickle cell day infusion clinic for management of her pain crisis.  Reviewed all laboratory values, largely consistent with patient's baseline.  Pain persists despite IV Dilaudid PCA, Toradol, Tylenol, and IV fluids.  Patient will be admitted to [facility] for further management of her pain crisis.  Hospital Course:  Sickle cell pain crisis:  Patient was admitted for sickle cell pain crisis and managed appropriately with IVF, IV Dilaudid via PCA and IV Toradol, as well as other adjunct therapies per sickle cell pain management protocols. Patient says that pain intensity has improved throughout admission. Today, she rates pain as 5/10. Continue home medications for pain control.  Oxycodone 10 mg every 6 hours as needed #60. Reviewed PDMP substance reporting system prior to prescribing opiate medications. No inconsistencies noted.  Patient is alert, oriented, and ambulating without assistance.   Patient was therefore discharged home today in a hemodynamically stable condition.   Carmen [name] will follow-up with PCP within 1 week of this discharge. Carmen [name] was counseled extensively about nonpharmacologic means of pain management, patient verbalized understanding and was appreciative of  the care received during this admission.   We discussed the need for good hydration, monitoring of hydration status, avoidance of heat, cold, stress, and infection triggers. We discussed the need to be adherent with taking  home medications. Patient was reminded of the need to seek medical attention immediately if any symptom of bleeding, anemia, or infection occurs.  Discharge Exam: Vitals:   12/20/22  0749 12/20/22 1004  BP:  118/83  Pulse:  (!) 102  Resp: 16 16  Temp:  98.9 F (37.2 C)  SpO2: 97% 96%   Vitals:   12/20/22 0457 12/20/22 0613 12/20/22 0749 12/20/22 1004  BP:  (!) 107/93  118/83  Pulse:  (!) 110  (!) 102  Resp: 14 18 16 16  $ Temp:  99 F  (37.2 C)  98.9 F (37.2 C)  TempSrc:  Oral  Oral  SpO2: 93% 94% 97% 96%    General appearance : Awake, alert, not in any distress. Speech Clear. Not toxic looking HEENT: Atraumatic and Normocephalic, pupils equally reactive to light and accomodation Neck: Supple, no JVD. No cervical lymphadenopathy.  Chest: Good air entry bilaterally, no added sounds  CVS: S1 S2 regular, no murmurs.  Abdomen: Bowel sounds present, Non tender and not distended with no gaurding, rigidity or rebound. Extremities: B/L Lower Ext shows no edema, both legs are warm to touch Neurology: Awake alert, and oriented X 3, CN II-XII intact, Non focal Skin: No Rash  Discharge Instructions   Allergies as of 12/20/2022       Reactions   Ketamine Hives, Nausea And Vomiting, Other (See Comments)   Makes the patient "feel funny" also        Medication List     TAKE these medications    betamethasone valerate ointment 0.1 % Commonly known as: VALISONE APPLY TO AFFECTED AREA TWICE A DAY   cyclobenzaprine 10 MG tablet Commonly known as: FLEXERIL Take 1 tablet (10 mg total) by mouth at bedtime. TAKE 1 TABLET BY MOUTH DAILY AS NEEDED FOR MUSCLE SPASMS. What changed:  when to take this reasons to take this additional instructions   DULoxetine 30 MG capsule Commonly known as: CYMBALTA Take 2 capsules (60 mg total) by mouth daily. What changed: when to take this   folic acid 1 MG tablet Commonly known as: FOLVITE Take 1 tablet (1 mg total) by mouth daily.   morphine 60 MG 12 hr tablet Commonly known as: MS CONTIN Take 1 tablet (60 mg total) by mouth every 12 (twelve) hours. Start taking on: December 24, 2022 What changed: These instructions start on December 24, 2022. If you are unsure what to do until then, ask your doctor or other care provider.   Oxycodone HCl 10 MG Tabs Take 1 tablet (10 mg total) by mouth every 6 (six) hours as needed (pain).   Vitamin D (Ergocalciferol) 1.25 MG (50000 UNIT)  Caps capsule Commonly known as: DRISDOL TAKE ONE CAPSULE BY MOUTH ONE TIME PER WEEK What changed:  how much to take how to take this when to take this additional instructions        The results of significant diagnostics from this hospitalization (including imaging, microbiology, ancillary and laboratory) are listed below for reference.    Significant Diagnostic Studies: No results found.  Microbiology: No results found for this or any previous visit (from the past 240 hour(s)).   Labs: Basic Metabolic Panel: Recent Labs  Lab 12/16/22 1335 12/17/22 0638  NA 140 135  K 4.0 3.5  CL 107 104  CO2 23 22  GLUCOSE 90 98  BUN 13 16  CREATININE 0.73 0.66  CALCIUM 9.0 9.0   Liver Function Tests: Recent Labs  Lab 12/16/22 1335  AST 30  ALT 25  ALKPHOS 63  BILITOT 1.0  PROT 8.5*  ALBUMIN 3.9   No results for input(s): "LIPASE", "AMYLASE" in the last 168 hours. No results  for input(s): "AMMONIA" in the last 168 hours. CBC: Recent Labs  Lab 12/16/22 1125 12/17/22 0638 12/19/22 0625  WBC 7.5 9.4 10.2  NEUTROABS 3.4  --  4.4  HGB 11.2* 9.8* 10.1*  HCT 32.3* 27.6* 27.8*  MCV 85.0 84.1 80.3  PLT 492* 398 345   Cardiac Enzymes: No results for input(s): "CKTOTAL", "CKMB", "CKMBINDEX", "TROPONINI" in the last 168 hours. BNP: Invalid input(s): "POCBNP" CBG: No results for input(s): "GLUCAP" in the last 168 hours.  Time coordinating discharge: 30 minutes  Signed:   [name]  APRN, MSN, FNP-C [facility] [address] [place], [place] [address] [phone]  [facility] 12/20/2022, 11:03 AM

## 2022-12-21 ENCOUNTER — Telehealth: Payer: Self-pay

## 2022-12-21 NOTE — Transitions of Care (Post Inpatient/ED Visit) (Unsigned)
   12/21/2022  Name: Carmen [name] MRN: [id number] DOB: 02-16-1991  Today's TOC FU Call Status: Today's TOC FU Call Status:: Unsuccessul Call (1st Attempt) Unsuccessful Call (1st Attempt) Date: 12/21/22  Attempted to reach the patient regarding the most recent Inpatient/ED visit.  Follow Up Plan: Additional outreach attempts will be made to reach the patient to complete the Transitions of Care (Post Inpatient/ED visit) call.   [place] LPN [place] Advisor Direct Dial [phone]

## 2022-12-22 NOTE — Transitions of Care (Post Inpatient/ED Visit) (Unsigned)
   12/22/2022  Name: Carmen [name] MRN: [id number] DOB: [birth year]  Today's TOC FU Call Status: Today's TOC FU Call Status:: Unsuccessful Call (2nd Attempt) Unsuccessful Call (1st Attempt) Date: 12/21/22 Unsuccessful Call (2nd Attempt) Date: 12/22/22  Attempted to reach the patient regarding the most recent Inpatient/ED visit.  Follow Up Plan: Additional outreach attempts will be made to reach the patient to complete the Transitions of Care (Post Inpatient/ED visit) call.   [place] LPN [place] Advisor Direct Dial [phone]

## 2022-12-23 NOTE — Transitions of Care (Post Inpatient/ED Visit) (Signed)
   12/23/2022  Name: Carmen [name] MRN: [id number] DOB: 11-Jul-1991  Today's TOC FU Call Status: Today's TOC FU Call Status:: Unsuccessful Call (3rd Attempt) Unsuccessful Call (1st Attempt) Date: 12/21/22 Unsuccessful Call (2nd Attempt) Date: 12/22/22 Unsuccessful Call (3rd Attempt) Date: 12/23/22  Attempted to reach the patient regarding the most recent Inpatient/ED visit.  Follow Up Plan: No further outreach attempts will be made at this time. We have been unable to contact the patient.  Pt has fu appt with PCP 01-05-23 - needs before 01-03-23   [place] LPN [place] Direct Dial [phone]

## 2022-12-31 ENCOUNTER — Other Ambulatory Visit: Payer: Self-pay | Admitting: Family Medicine

## 2022-12-31 DIAGNOSIS — D571 Sickle-cell disease without crisis: Secondary | ICD-10-CM

## 2022-12-31 DIAGNOSIS — Z79891 Long term (current) use of opiate analgesic: Secondary | ICD-10-CM

## 2022-12-31 MED ORDER — OXYCODONE HCL 10 MG PO TABS: 10.0000 mg | ORAL_TABLET | Freq: Four times a day (QID) | ORAL | 0 refills | Status: DC | PRN

## 2022-12-31 NOTE — Telephone Encounter (Signed)
Reviewed PDMP substance reporting system prior to prescribing opiate medications. No inconsistencies noted.  Meds ordered this encounter  Medications   Oxycodone HCl 10 MG TABS    Sig: Take 1 tablet (10 mg total) by mouth every 6 (six) hours as needed (pain).    Dispense:  60 tablet    Refill:  0    Order Specific Question:   Supervising Provider    Answer:   Tresa Garter [id number]   [name]  APRN, MSN, FNP-C [facility] [address] [place], [place] [address] [phone]

## 2022-12-31 NOTE — Telephone Encounter (Signed)
From: [name] To: Office of [name], [place] Sent: 12/30/2022 7:12 PM EST Subject: Medication Renewal Request  Refills have been requested for the following medications:   Oxycodone HCl 10 MG TABS [[place]]  Preferred pharmacy: [id number] [place], [place] - [address]ivery method: [employer]

## 2023-01-06 ENCOUNTER — Ambulatory Visit: Payer: Medicaid Other | Admitting: Internal Medicine

## 2023-01-06 ENCOUNTER — Other Ambulatory Visit (HOSPITAL_COMMUNITY)
Admission: RE | Admit: 2023-01-06 | Discharge: 2023-01-06 | Disposition: A | Discharge status: Home / Self Care | Payer: Medicaid Other | Source: Ambulatory Visit | Attending: Internal Medicine | Admitting: Internal Medicine

## 2023-01-06 ENCOUNTER — Encounter: Payer: Self-pay | Admitting: Internal Medicine

## 2023-01-06 VITALS — BP 98/69 | HR 92 | Temp 98.1°F | Wt 199.0 lb

## 2023-01-06 DIAGNOSIS — Z124 Encounter for screening for malignant neoplasm of cervix: Secondary | ICD-10-CM | POA: Insufficient documentation

## 2023-01-06 LAB — RESULTS CONSOLE HPV: CHL HPV: NEGATIVE

## 2023-01-06 LAB — HM PAP SMEAR

## 2023-01-06 NOTE — Progress Notes (Signed)
Carmen [name], is a 32 y.o. female  [id number]  [id number]  DOB - [birth year]  CC:  Chief Complaint  Patient presents with   Gynecologic Exam       HPI: Carmen [name] is a 31 y.o. female with history of sickle cell disease, who presented here today for her routine breast exam and Pap smear cervical cancer screening.  She has no complaint today.  She denies any vaginal discharge.  She has no sickle cell related pain today. Patient has No headache, No chest pain, No abdominal pain - No Nausea, No new weakness tingling or numbness, No Cough - SOB.   Allergies  Allergen Reactions   Ketamine Hives, Nausea And Vomiting and Other (See Comments)    Makes the patient "feel funny" also   Past Medical History:  Diagnosis Date   Anxiety    Headache(784.0)    Heart murmur    Sickle cell crisis ([place])    Syphilis 2015   Was diagnosed and received one injection of antibiotics   Thrombocytosis 11/22/2014     CBC Latest Ref Rng & Units 12/13/2018 12/11/2018 12/10/2018 WBC 4.0 - 10.5 K/uL 14.8(H) 13.2(H) 15.9(H) Hemoglobin 12.0 - 15.0 g/dL 8.2(L) 7.7(L) 8.1(L) Hematocrit 36.0 - 46.0 % 23.7(L) 23.2(L) 24.5(L) Platelets 150 - 400 K/uL [phone]     Current Outpatient Medications on File Prior to Visit  Medication Sig Dispense Refill   cyclobenzaprine (FLEXERIL) 10 MG tablet Take 1 tablet (10 mg total) by mouth at bedtime. TAKE 1 TABLET BY MOUTH DAILY AS NEEDED FOR MUSCLE SPASMS. (Patient taking differently: Take 10 mg by mouth daily as needed for muscle spasms.) 30 tablet 2   DULoxetine (CYMBALTA) 30 MG capsule Take 2 capsules (60 mg total) by mouth daily. (Patient taking differently: Take 60 mg by mouth at bedtime.) [id number] capsule 1   folic acid (FOLVITE) 1 MG tablet Take 1 tablet (1 mg total) by mouth daily. 90 tablet 3   morphine (MS CONTIN) 60 MG 12 hr tablet Take 1 tablet (60 mg total) by mouth every 12 (twelve) hours. 60 tablet 0   [START ON 01/07/2023] Oxycodone HCl 10 MG TABS Take 1 tablet  (10 mg total) by mouth every 6 (six) hours as needed (pain). 60 tablet 0   Vitamin D, Ergocalciferol, (DRISDOL) 1.25 MG (50000 UNIT) CAPS capsule TAKE ONE CAPSULE BY MOUTH ONE TIME PER WEEK (Patient taking differently: Take 50,000 Units by mouth every Saturday.) 30 capsule 1   betamethasone valerate ointment (VALISONE) 0.1 % APPLY TO AFFECTED AREA TWICE A DAY (Patient not taking: Reported on 05/10/2022) 30 g 1   No current facility-administered medications on file prior to visit.   Family History  Problem Relation Age of Onset   Hypertension Mother    Sickle cell anemia Sister    Kidney disease Sister        Lupus   Arthritis Sister    Sickle cell anemia Sister    Sickle cell trait Sister    Heart disease Maternal Aunt        CABG   Heart disease Maternal Uncle        CABG   Lupus Sister    Social History   Socioeconomic History   Marital status: Single    Spouse name: Not on file   Number of children: Not on file   Years of education: Not on file   Highest education level: Not on file  Occupational History   Not on file  Tobacco Use   Smoking status: Some Days    Types: Cigarettes   Smokeless tobacco: Never  Vaping Use   Vaping Use: Never used  Substance and Sexual Activity   Alcohol use: No   Drug use: No   Sexual activity: Yes    Birth control/protection: Injection  Other Topics Concern   Not on file  Social History Narrative   Not on file   Social Determinants of Health   Financial Resource Strain: Not on file  Food Insecurity: No Food Insecurity (11/15/2022)   Hunger Vital Sign    Worried About Running Out of Food in the Last Year: Never true    Ran Out of Food in the Last Year: Never true  Transportation Needs: Unmet Transportation Needs (11/23/2022)   PRAPARE - Hydrologist (Medical): Yes    Lack of Transportation (Non-Medical): No  Physical Activity: Not on file  Stress: Not on file  Social Connections: Not on file   Intimate Partner Violence: Not At Risk (11/15/2022)   Humiliation, Afraid, Rape, and Kick questionnaire    Fear of Current or Ex-Partner: No    Emotionally Abused: No    Physically Abused: No    Sexually Abused: No    Review of Systems: Constitutional: Negative for fever, chills, diaphoresis, activity change, appetite change and fatigue. HENT: Negative for ear pain, nosebleeds, congestion, facial swelling, rhinorrhea, neck pain, neck stiffness and ear discharge.  Eyes: Negative for pain, discharge, redness, itching and visual disturbance. Respiratory: Negative for cough, choking, chest tightness, shortness of breath, wheezing and stridor.  Cardiovascular: Negative for chest pain, palpitations and leg swelling. Gastrointestinal: Negative for abdominal distention. Genitourinary: Negative for dysuria, urgency, frequency, hematuria, flank pain, decreased urine volume, difficulty urinating and dyspareunia.  Musculoskeletal: Negative for back pain, joint swelling, arthralgia and gait problem. Neurological: Negative for dizziness, tremors, seizures, syncope, facial asymmetry, speech difficulty, weakness, light-headedness, numbness and headaches.  Hematological: Negative for adenopathy. Does not bruise/bleed easily. Psychiatric/Behavioral: Negative for hallucinations, behavioral problems, confusion, dysphoric mood, decreased concentration and agitation.    Objective:   Vitals:   01/06/23 0852  BP: 98/69  Pulse: 92  Temp: 98.1 F (36.7 C)  SpO2: 100%    Physical Exam: Constitutional: Patient appears well-developed and well-nourished. No distress. HENT: Normocephalic, atraumatic, External right and left ear normal. Oropharynx is clear and moist.  Eyes: Conjunctivae and EOM are normal. PERRLA, no scleral icterus. Neck: Normal ROM. Neck supple. No JVD. No tracheal deviation. No thyromegaly. CVS: RRR, S1/S2 +, no murmurs, no gallops, no carotid bruit.  Pulmonary: Effort and breath sounds  normal, no stridor, rhonchi, wheezes, rales.  Abdominal: Soft. BS +, no distension, tenderness, rebound or guarding.  Musculoskeletal: Normal range of motion. No edema and no tenderness.  Lymphadenopathy: No lymphadenopathy noted, cervical, inguinal or axillary Neuro: Alert. Normal reflexes, muscle tone coordination. No cranial nerve deficit. Skin: Skin is warm and dry. No rash noted. Not diaphoretic. No erythema. No pallor. Psychiatric: Normal mood and affect. Behavior, judgment, thought content normal.  Pelvic examination: Normal external female genitalia, central cervix, slightly patulous.  No bleeding.  No significant discharge and no malodor.  Pap smear conducted successfully.  No contact bleeding.  Sample sent to the lab.  Lab Results  Component Value Date   WBC 10.2 12/19/2022   HGB 10.1 (L) 12/19/2022   HCT 27.8 (L) 12/19/2022   MCV 80.3 12/19/2022   PLT 345 12/19/2022   Lab Results  Component Value Date  CREATININE 0.66 12/17/2022   BUN 16 12/17/2022   NA 135 12/17/2022   K 3.5 12/17/2022   CL 104 12/17/2022   CO2 22 12/17/2022    No results found for: "HGBA1C" Lipid Panel  No results found for: "CHOL", "TRIG", "HDL", "CHOLHDL", "VLDL", "LDLCALC"      Assessment and plan:   1. Pap smear for cervical cancer screening  - Cytology - PAP([place])  Pap smear completed: Pap test     Return in about 4 weeks (around 02/03/2023) for Routine Follow Up.  The patient was told to call to get lab results if they haven't heard anything in the next week.    This note has been created with Surveyor, quantity. Any transcriptional errors are unintentional.    Angelica Chessman, MD, MHA, Karilyn Cota, [place] Silt, [place]   01/06/2023, 9:22 AM

## 2023-01-13 LAB — CYTOLOGY - PAP
Chlamydia: NEGATIVE
Comment: NEGATIVE
Comment: NEGATIVE
Comment: NEGATIVE
Comment: NORMAL
Diagnosis: NEGATIVE
HSV1: NEGATIVE
HSV2: NEGATIVE
Neisseria Gonorrhea: NEGATIVE
Trichomonas: NEGATIVE

## 2023-01-18 ENCOUNTER — Other Ambulatory Visit: Payer: Self-pay | Admitting: Family Medicine

## 2023-01-18 DIAGNOSIS — D571 Sickle-cell disease without crisis: Secondary | ICD-10-CM

## 2023-01-18 DIAGNOSIS — Z79891 Long term (current) use of opiate analgesic: Secondary | ICD-10-CM

## 2023-01-18 MED ORDER — MORPHINE SULFATE ER 60 MG PO TBCR: 60.0000 mg | EXTENDED_RELEASE_TABLET | Freq: Two times a day (BID) | ORAL | 0 refills | Status: DC

## 2023-01-18 MED ORDER — OXYCODONE HCL 10 MG PO TABS: 10.0000 mg | ORAL_TABLET | Freq: Four times a day (QID) | ORAL | 0 refills | Status: DC | PRN

## 2023-01-18 NOTE — Telephone Encounter (Signed)
From: [name] To: Office of [name], [place] Sent: 01/18/2023 2:25 AM EDT Subject: Medication Renewal Request  Refills have been requested for the following medications:   morphine (MS CONTIN) 60 MG 12 hr tablet [Carmen [name]]  Preferred pharmacy: [id number] - [place], [place] [address] Delivery method: [employer]

## 2023-01-18 NOTE — Telephone Encounter (Signed)
Reviewed PDMP substance reporting system prior to prescribing opiate medications. No inconsistencies noted.  Meds ordered this encounter  Medications   morphine (MS CONTIN) 60 MG 12 hr tablet    Sig: Take 1 tablet (60 mg total) by mouth every 12 (twelve) hours.    Dispense:  60 tablet    Refill:  0    Order Specific Question:   Supervising Provider    Answer:   Tresa Garter [id number]   Oxycodone HCl 10 MG TABS    Sig: Take 1 tablet (10 mg total) by mouth every 6 (six) hours as needed (pain).    Dispense:  60 tablet    Refill:  0    Order Specific Question:   Supervising Provider    Answer:   Tresa Garter [id number]   [name]  APRN, MSN, FNP-C [facility] [address] [place], [place] [address] [phone]

## 2023-02-03 ENCOUNTER — Other Ambulatory Visit: Payer: Self-pay

## 2023-02-03 DIAGNOSIS — Z79891 Long term (current) use of opiate analgesic: Secondary | ICD-10-CM

## 2023-02-03 DIAGNOSIS — D571 Sickle-cell disease without crisis: Secondary | ICD-10-CM

## 2023-02-03 MED ORDER — OXYCODONE HCL 10 MG PO TABS: 10.0000 mg | ORAL_TABLET | Freq: Four times a day (QID) | ORAL | 0 refills | Status: DC | PRN

## 2023-02-03 NOTE — Telephone Encounter (Signed)
Please advise KH 

## 2023-02-03 NOTE — Telephone Encounter (Signed)
From: [name] To: Office of [name], [place] Sent: 02/02/2023 1:54 PM EDT Subject: Medication Renewal Request  Refills have been requested for the following medications:   Oxycodone HCl 10 MG TABS [Lachina Hollis]  Preferred pharmacy: [id number] - [place], [place] - [place] Delivery method: [employer]

## 2023-02-04 ENCOUNTER — Telehealth: Payer: Self-pay

## 2023-02-04 NOTE — Telephone Encounter (Signed)
PRIOR AUTH SUBMITTED FOR OXYCODONE TODAY VIA [facility]FIRMATION #[id number] W

## 2023-02-07 ENCOUNTER — Other Ambulatory Visit: Payer: Self-pay

## 2023-02-07 NOTE — Telephone Encounter (Signed)
Approved until 05/05/2023

## 2023-02-15 ENCOUNTER — Inpatient Hospital Stay (HOSPITAL_COMMUNITY)
Admission: AD | Admit: 2023-02-15 | Discharge: 2023-02-21 | DRG: 812 | Discharge status: Against Medical Advice | Payer: Medicaid Other | Attending: Internal Medicine | Admitting: Internal Medicine

## 2023-02-15 ENCOUNTER — Other Ambulatory Visit: Payer: Self-pay

## 2023-02-15 ENCOUNTER — Encounter (HOSPITAL_COMMUNITY): Payer: Self-pay | Admitting: Family Medicine

## 2023-02-15 ENCOUNTER — Telehealth (HOSPITAL_COMMUNITY): Payer: Self-pay

## 2023-02-15 DIAGNOSIS — Z9081 Acquired absence of spleen: Secondary | ICD-10-CM | POA: Diagnosis not present

## 2023-02-15 DIAGNOSIS — D75838 Other thrombocytosis: Secondary | ICD-10-CM | POA: Diagnosis present

## 2023-02-15 DIAGNOSIS — F329 Major depressive disorder, single episode, unspecified: Secondary | ICD-10-CM | POA: Diagnosis present

## 2023-02-15 DIAGNOSIS — Z5329 Procedure and treatment not carried out because of patient's decision for other reasons: Secondary | ICD-10-CM | POA: Diagnosis present

## 2023-02-15 DIAGNOSIS — G894 Chronic pain syndrome: Secondary | ICD-10-CM | POA: Diagnosis present

## 2023-02-15 DIAGNOSIS — D5700 Hb-SS disease with crisis, unspecified: Principal | ICD-10-CM | POA: Diagnosis present

## 2023-02-15 DIAGNOSIS — Z832 Family history of diseases of the blood and blood-forming organs and certain disorders involving the immune mechanism: Secondary | ICD-10-CM | POA: Diagnosis not present

## 2023-02-15 DIAGNOSIS — Z885 Allergy status to narcotic agent status: Secondary | ICD-10-CM

## 2023-02-15 DIAGNOSIS — F419 Anxiety disorder, unspecified: Secondary | ICD-10-CM | POA: Diagnosis present

## 2023-02-15 DIAGNOSIS — Z8261 Family history of arthritis: Secondary | ICD-10-CM

## 2023-02-15 DIAGNOSIS — Z79899 Other long term (current) drug therapy: Secondary | ICD-10-CM | POA: Diagnosis not present

## 2023-02-15 DIAGNOSIS — F339 Major depressive disorder, recurrent, unspecified: Secondary | ICD-10-CM | POA: Diagnosis present

## 2023-02-15 DIAGNOSIS — Z841 Family history of disorders of kidney and ureter: Secondary | ICD-10-CM | POA: Diagnosis not present

## 2023-02-15 DIAGNOSIS — Z79891 Long term (current) use of opiate analgesic: Secondary | ICD-10-CM

## 2023-02-15 DIAGNOSIS — Z5982 Transportation insecurity: Secondary | ICD-10-CM | POA: Diagnosis not present

## 2023-02-15 DIAGNOSIS — D638 Anemia in other chronic diseases classified elsewhere: Secondary | ICD-10-CM | POA: Diagnosis present

## 2023-02-15 DIAGNOSIS — Z8249 Family history of ischemic heart disease and other diseases of the circulatory system: Secondary | ICD-10-CM

## 2023-02-15 LAB — CBC WITH DIFFERENTIAL/PLATELET
Abs Immature Granulocytes: 0.05 10*3/uL (ref 0.00–0.07)
Basophils Absolute: 0 10*3/uL (ref 0.0–0.1)
Basophils Relative: 0 %
Eosinophils Absolute: 0.2 10*3/uL (ref 0.0–0.5)
Eosinophils Relative: 2 %
HCT: 29.5 % — ABNORMAL LOW (ref 36.0–46.0)
Hemoglobin: 10.5 g/dL — ABNORMAL LOW (ref 12.0–15.0)
Immature Granulocytes: 1 %
Lymphocytes Relative: 26 %
Lymphs Abs: 2.5 10*3/uL (ref 0.7–4.0)
MCH: 29.8 pg (ref 26.0–34.0)
MCHC: 35.6 g/dL (ref 30.0–36.0)
MCV: 83.8 fL (ref 80.0–100.0)
Monocytes Absolute: 1.4 10*3/uL — ABNORMAL HIGH (ref 0.1–1.0)
Monocytes Relative: 15 %
Neutro Abs: 5.5 10*3/uL (ref 1.7–7.7)
Neutrophils Relative %: 56 %
Platelets: 540 10*3/uL — ABNORMAL HIGH (ref 150–400)
RBC: 3.52 MIL/uL — ABNORMAL LOW (ref 3.87–5.11)
RDW: 13.2 % (ref 11.5–15.5)
WBC: 9.7 10*3/uL (ref 4.0–10.5)
nRBC: 0 % (ref 0.0–0.2)

## 2023-02-15 LAB — RETICULOCYTES
Immature Retic Fract: 31.2 % — ABNORMAL HIGH (ref 2.3–15.9)
RBC.: 3.51 MIL/uL — ABNORMAL LOW (ref 3.87–5.11)
Retic Count, Absolute: 143.2 10*3/uL (ref 19.0–186.0)
Retic Ct Pct: 4.1 % — ABNORMAL HIGH (ref 0.4–3.1)

## 2023-02-15 LAB — COMPREHENSIVE METABOLIC PANEL
ALT: 24 U/L (ref 0–44)
AST: 35 U/L (ref 15–41)
Albumin: 4.2 g/dL (ref 3.5–5.0)
Alkaline Phosphatase: 63 U/L (ref 38–126)
Anion gap: 5 (ref 5–15)
BUN: 12 mg/dL (ref 6–20)
CO2: 24 mmol/L (ref 22–32)
Calcium: 9 mg/dL (ref 8.9–10.3)
Chloride: 106 mmol/L (ref 98–111)
Creatinine, Ser: 0.62 mg/dL (ref 0.44–1.00)
GFR, Estimated: >60 mL/min (ref >60)
Glucose, Bld: 90 mg/dL (ref 70–99)
Potassium: 4 mmol/L (ref 3.5–5.1)
Sodium: 135 mmol/L (ref 135–145)
Total Bilirubin: 1.1 mg/dL (ref 0.3–1.2)
Total Protein: 8 g/dL (ref 6.5–8.1)

## 2023-02-15 MED ORDER — KETOROLAC TROMETHAMINE 15 MG/ML IJ SOLN
15.0000 mg | Freq: Four times a day (QID) | INTRAMUSCULAR | Status: AC
  Administered 2023-02-15 – 2023-02-20 (×20): 15 mg via INTRAVENOUS
  Filled 2023-02-15 (×20): qty 1

## 2023-02-15 MED ORDER — DIPHENHYDRAMINE HCL 25 MG PO CAPS: 25.0000 mg | ORAL_CAPSULE | ORAL | Status: DC | PRN

## 2023-02-15 MED ORDER — FOLIC ACID 1 MG PO TABS
1.0000 mg | ORAL_TABLET | Freq: Every day | ORAL | Status: DC
  Administered 2023-02-15 – 2023-02-21 (×7): 1 mg via ORAL
  Filled 2023-02-15 (×7): qty 1

## 2023-02-15 MED ORDER — HYDROMORPHONE 1 MG/ML IV SOLN
INTRAVENOUS | Status: DC
  Administered 2023-02-15: 3 mg via INTRAVENOUS
  Administered 2023-02-15: 30 mg via INTRAVENOUS
  Administered 2023-02-15: 10.5 mg via INTRAVENOUS
  Administered 2023-02-15: 9.5 mg via INTRAVENOUS
  Administered 2023-02-16: 30 mg via INTRAVENOUS
  Administered 2023-02-16: 7.5 mg via INTRAVENOUS
  Administered 2023-02-16: 30 mg via INTRAVENOUS
  Administered 2023-02-16: 4 mg via INTRAVENOUS
  Administered 2023-02-16: 6 mg via INTRAVENOUS
  Administered 2023-02-16: 8.5 mg via INTRAVENOUS
  Administered 2023-02-16: 4.5 mg via INTRAVENOUS
  Administered 2023-02-16: 5 mg via INTRAVENOUS
  Administered 2023-02-17: 8 mg via INTRAVENOUS
  Administered 2023-02-17: 30 mg via INTRAVENOUS
  Administered 2023-02-17: 8.5 mg via INTRAVENOUS
  Administered 2023-02-17: 3.5 mg via INTRAVENOUS
  Administered 2023-02-17: 5.5 mg via INTRAVENOUS
  Administered 2023-02-17: 7.5 mg via INTRAVENOUS
  Administered 2023-02-18: 5 mg via INTRAVENOUS
  Administered 2023-02-18: 5.5 mg via INTRAVENOUS
  Administered 2023-02-18: 30 mg via INTRAVENOUS
  Administered 2023-02-18 (×2): 5.5 mg via INTRAVENOUS
  Administered 2023-02-18: 10.5 mg via INTRAVENOUS
  Administered 2023-02-18: 6.5 mg via INTRAVENOUS
  Administered 2023-02-19: 12 mg via INTRAVENOUS
  Administered 2023-02-19: 30 mg via INTRAVENOUS
  Administered 2023-02-19: 3.5 mg via INTRAVENOUS
  Administered 2023-02-19: 2 mg via INTRAVENOUS
  Administered 2023-02-19: 3.5 mg via INTRAVENOUS
  Administered 2023-02-19: 5.5 mg via INTRAVENOUS
  Administered 2023-02-19: 0.5 mg via INTRAVENOUS
  Administered 2023-02-20: 5 mg via INTRAVENOUS
  Administered 2023-02-20: 3.5 mg via INTRAVENOUS
  Administered 2023-02-20: 6 mg via INTRAVENOUS
  Administered 2023-02-20: 2 mg via INTRAVENOUS
  Administered 2023-02-20: 9 mg via INTRAVENOUS
  Administered 2023-02-20: 1.5 mg via INTRAVENOUS
  Administered 2023-02-20: 30 mg via INTRAVENOUS
  Administered 2023-02-21: 6.29 mg via INTRAVENOUS
  Administered 2023-02-21: 8 mg via INTRAVENOUS
  Administered 2023-02-21: 30 mg via INTRAVENOUS
  Filled 2023-02-15 (×8): qty 30

## 2023-02-15 MED ORDER — NALOXONE HCL 0.4 MG/ML IJ SOLN: 0.4000 mg | INTRAMUSCULAR | Status: DC | PRN

## 2023-02-15 MED ORDER — KETOROLAC TROMETHAMINE 30 MG/ML IJ SOLN
15.0000 mg | Freq: Once | INTRAMUSCULAR | Status: AC
  Administered 2023-02-15: 15 mg via INTRAVENOUS
  Filled 2023-02-15: qty 1

## 2023-02-15 MED ORDER — SODIUM CHLORIDE 0.45 % IV SOLN: INTRAVENOUS | Status: DC

## 2023-02-15 MED ORDER — POLYETHYLENE GLYCOL 3350 17 G PO PACK: 17.0000 g | PACK | Freq: Every day | ORAL | Status: DC | PRN

## 2023-02-15 MED ORDER — DULOXETINE HCL 60 MG PO CPEP
60.0000 mg | ORAL_CAPSULE | Freq: Every day | ORAL | Status: DC
  Administered 2023-02-15 – 2023-02-21 (×7): 60 mg via ORAL
  Filled 2023-02-15 (×7): qty 1

## 2023-02-15 MED ORDER — SODIUM CHLORIDE 0.9% FLUSH: 9.0000 mL | INTRAVENOUS | Status: DC | PRN

## 2023-02-15 MED ORDER — ACETAMINOPHEN 500 MG PO TABS
1000.0000 mg | ORAL_TABLET | Freq: Once | ORAL | Status: AC
  Administered 2023-02-15: 1000 mg via ORAL
  Filled 2023-02-15: qty 2

## 2023-02-15 MED ORDER — SENNOSIDES-DOCUSATE SODIUM 8.6-50 MG PO TABS
1.0000 | ORAL_TABLET | Freq: Two times a day (BID) | ORAL | Status: DC
  Administered 2023-02-15 – 2023-02-21 (×12): 1 via ORAL
  Filled 2023-02-15 (×12): qty 1

## 2023-02-15 MED ORDER — ENOXAPARIN SODIUM 40 MG/0.4ML IJ SOSY
40.0000 mg | PREFILLED_SYRINGE | INTRAMUSCULAR | Status: DC
  Filled 2023-02-15 (×4): qty 0.4

## 2023-02-15 MED ORDER — MORPHINE SULFATE ER 15 MG PO TBCR
60.0000 mg | EXTENDED_RELEASE_TABLET | Freq: Two times a day (BID) | ORAL | Status: DC
  Administered 2023-02-15 – 2023-02-21 (×12): 60 mg via ORAL
  Filled 2023-02-15 (×12): qty 4

## 2023-02-15 MED ORDER — ONDANSETRON HCL 4 MG/2ML IJ SOLN
4.0000 mg | Freq: Four times a day (QID) | INTRAMUSCULAR | Status: DC | PRN
  Administered 2023-02-15 – 2023-02-16 (×2): 4 mg via INTRAVENOUS
  Filled 2023-02-15 (×3): qty 2

## 2023-02-15 NOTE — H&P (Signed)
[facility] History and Physical   Date: 02/15/2023  Patient name: Carmen [name] Medical record number: [id number] Date of birth: [birth year] Age: 32 y.o. Gender: female PCP: Massie Maroon, FNP  Attending physician: Quentin Angst, MD  Chief Complaint: Sickle cell pain   History of Present Illness: Carmen [name] is a 32 year old patient with a medical history significant for sickle cell disease, chronic pain syndrome, opiate dependence and tolerance, depression, and anemia of chronic disease presents with complaints of low back and lower extremity pain over the past week. Patient attributes pain crisis to being outside for her son's birthday party. Patient says that pain intensity is 8/10 characterized as constant and aching. Patient last had oxycodone this am without sustained relief. Patient denies headache, chest pain, urinary symptoms, nausea, vomiting, or diarrhea. No sick contacts, recent travel or known exposure to COVID-19.   Meds: Medications Prior to Admission  Medication Sig Dispense Refill Last Dose   betamethasone valerate ointment (VALISONE) 0.1 % APPLY TO AFFECTED AREA TWICE A DAY (Patient not taking: Reported on 05/10/2022) 30 g 1    cyclobenzaprine (FLEXERIL) 10 MG tablet Take 1 tablet (10 mg total) by mouth at bedtime. TAKE 1 TABLET BY MOUTH DAILY AS NEEDED FOR MUSCLE SPASMS. (Patient taking differently: Take 10 mg by mouth daily as needed for muscle spasms.) 30 tablet 2    DULoxetine (CYMBALTA) 30 MG capsule Take 2 capsules (60 mg total) by mouth daily. (Patient taking differently: Take 60 mg by mouth at bedtime.) 180 capsule 1    folic acid (FOLVITE) 1 MG tablet Take 1 tablet (1 mg total) by mouth daily. 90 tablet 3    morphine (MS CONTIN) 60 MG 12 hr tablet Take 1 tablet (60 mg total) by mouth every 12 (twelve) hours. 60 tablet 0    Oxycodone HCl 10 MG TABS Take 1 tablet (10 mg total) by mouth every 6 (six) hours as needed (pain). 60 tablet 0     Vitamin D, Ergocalciferol, (DRISDOL) 1.25 MG (50000 UNIT) CAPS capsule TAKE ONE CAPSULE BY MOUTH ONE TIME PER WEEK (Patient taking differently: Take 50,000 Units by mouth every Saturday.) 30 capsule 1     Allergies: Ketamine Past Medical History:  Diagnosis Date   Anxiety    Headache(784.0)    Heart murmur    Sickle cell crisis ([facility])    Syphilis 2015   Was diagnosed and received one injection of antibiotics   Thrombocytosis 11/22/2014     CBC Latest Ref Rng & Units 12/13/2018 12/11/2018 12/10/2018 WBC 4.0 - 10.5 K/uL 14.8(H) 13.2(H) 15.9(H) Hemoglobin 12.0 - 15.0 g/dL 8.2(L) 7.7(L) 8.1(L) Hematocrit 36.0 - 46.0 % 23.7(L) 23.2(L) 24.5(L) Platelets 150 - 400 K/uL [phone]     Past Surgical History:  Procedure Laterality Date   CESAREAN SECTION N/A 02/05/2019   Procedure: CESAREAN SECTION;  Surgeon: Hermina Staggers, MD;  Location: [facility] LD ORS;  Service: Obstetrics;  Laterality: N/A;   CHOLECYSTECTOMY N/A 11/30/2014   Procedure: LAPAROSCOPIC CHOLECYSTECTOMY SINGLE SITE WITH INTRAOPERATIVE CHOLANGIOGRAM;  Surgeon: Karie Soda, MD;  Location: [facility] ORS;  Service: General;  Laterality: N/A;   SPLENECTOMY     Family History  Problem Relation Age of Onset   Hypertension Mother    Sickle cell anemia Sister    Kidney disease Sister        Lupus   Arthritis Sister    Sickle cell anemia Sister    Sickle cell trait Sister  Heart disease Maternal Aunt        CABG   Heart disease Maternal Uncle        CABG   Lupus Sister    Social History   Socioeconomic History   Marital status: Single    Spouse name: Not on file   Number of children: Not on file   Years of education: Not on file   Highest education level: Not on file  Occupational History   Not on file  Tobacco Use   Smoking status: Some Days    Types: Cigarettes   Smokeless tobacco: Never  Vaping Use   Vaping Use: Never used  Substance and Sexual Activity   Alcohol use: No   Drug use: No   Sexual activity: Yes    Birth  control/protection: Injection  Other Topics Concern   Not on file  Social History Narrative   Not on file   Social Determinants of Health   Financial Resource Strain: Not on file  Food Insecurity: No Food Insecurity (11/15/2022)   Hunger Vital Sign    Worried About Running Out of Food in the Last Year: Never true    Ran Out of Food in the Last Year: Never true  Transportation Needs: Unmet Transportation Needs (11/23/2022)   PRAPARE - Administrator, Civil Service (Medical): Yes    Lack of Transportation (Non-Medical): No  Physical Activity: Not on file  Stress: Not on file  Social Connections: Not on file  Intimate Partner Violence: Not At Risk (11/15/2022)   Humiliation, Afraid, Rape, and Kick questionnaire    Fear of Current or Ex-Partner: No    Emotionally Abused: No    Physically Abused: No    Sexually Abused: No   Review of Systems  Constitutional: Negative.   HENT: Negative.    Eyes: Negative.   Respiratory: Negative.    Cardiovascular: Negative.   Gastrointestinal: Negative.   Genitourinary: Negative.   Musculoskeletal:  Positive for back pain and joint pain.  Skin: Negative.   Neurological: Negative.   Endo/Heme/Allergies: Negative.   Psychiatric/Behavioral: Negative.      Physical Exam: Blood pressure 122/72, pulse 92, temperature 98.3 F (36.8 C), temperature source Temporal, resp. rate 16, SpO2 98 %. Physical Exam Constitutional:      Appearance: Normal appearance.  Eyes:     Pupils: Pupils are equal, round, and reactive to light.  Cardiovascular:     Rate and Rhythm: Normal rate and regular rhythm.  Pulmonary:     Effort: Pulmonary effort is normal.  Abdominal:     General: Bowel sounds are normal.  Musculoskeletal:        General: Normal range of motion.  Skin:    General: Skin is warm.  Neurological:     General: No focal deficit present.     Mental Status: She is alert. Mental status is at baseline.  Psychiatric:        Mood and  Affect: Mood normal.        Behavior: Behavior normal.        Thought Content: Thought content normal.        Judgment: Judgment normal.      Lab results: No results found for this or any previous visit (from the past 24 hour(s)).  Imaging results:  No results found.   Assessment & Plan: Patient admitted to sickle cell day infusion center for management of pain crisis.  Patient is opiate tolerant Initiate IV dilaudid PCA.  IV fluids,  0.45% 100 ml/hr Toradol 15 mg IV times one dose Tylenol 1000 mg by mouth times one dose Review CBC with differential, complete metabolic panel, and reticulocytes as results become available. Pain intensity will be reevaluated in context of functioning and relationship to baseline as care progresses If pain intensity remains elevated and/or sudden change in hemodynamic stability transition to inpatient services for higher level of care.      Nolon Nations  APRN, MSN, FNP-C [facility] [address] [place], [place] [address] [phone]  02/15/2023, 9:48 AM

## 2023-02-15 NOTE — Telephone Encounter (Signed)
Pt called day hospital wanting to come in today for sickle cell pain treatment. Pt reports 10/10 pain to lower back, and 8/10 pain to bilateral hips. Pt reports taking Oxycodone 10 mg at 3 AM. Pt denies being to ER recently. Pt screened for COVID and denies symptoms and exposures. Pt denies fever, chest pain, N/V/D, and abdominal pain. [place] Hollis, FNP notified and she said pt can come to day hospital today. Pt notified and verbalized understanding. Pt states that they will use [facility] as transportation to and from clinic today.

## 2023-02-15 NOTE — Progress Notes (Signed)
Pt admitted to day hospital for sickle cell pain treatment. On arrival, pt rates pain 10/10 to lower back, and 8/10 to bilateral hips. Pt received Dilaudid PCA, IV Toradol, and hydrated with IV fluids via PIV. Due to ongoing pain, pt is being admitted to [address] [place] by [place] Hollis, FNP. At time of transfer pt rates pain 7/10. PCA settings verified with Lamont Snowball, RN. Report given to [place], [place]. Pt is alert, oriented, and ambulatory at discharge.  Pt transferred to 6 east, bed 6 in wheelchair by RN.

## 2023-02-15 NOTE — H&P (Signed)
H&P  Patient Demographics:  Carmen [name], is a 32 y.o. female  MRN: [id number]   DOB - 12-24-1990  Admit Date - 02/15/2023  Outpatient Primary MD for the patient is Massie Maroon, FNP  No chief complaint on file.     HPI:   Carmen [name]  is a 32 y.o. female with a medical history significant for sickle cell disease, chronic pain syndrome, opiate dependence and tolerance, depression, and anemia of chronic disease presented to sickle cell day infusion clinic with complaints of generalized pain.  Pain is consistent with previous sickle cell crisis.  Patient attributes pain crisis to increased outside activity during her son's birthday party.  Pain intensity has been increasing over the past week and unrelieved by her home medications.  Patient has been taking MS Contin and oxycodone consistently.  Patient is not on any disease modifying agents at this time.  She rates pain as 8/10, constant, and throbbing.  Pain is mostly to her low back and lower extremities.  Patient denies any dizziness, fever, chills, headache, or shortness of breath.  No urinary symptoms, nausea, vomiting, or diarrhea.  Sickle cell center course: Vital signs show: BP 117/61 (BP Location: Right Arm)   Pulse 75   Temp 98 F (36.7 C) (Temporal)   Resp 15   SpO2 98%  Complete blood count notable for hemoglobin 10.5 and platelets 540,000.  Complete metabolic panel unremarkable.  Patient's pain persists despite IV Dilaudid, IV fluids, and IV Toradol.  Patient will be admitted to [facility] for further management and evaluation of her sickle cell pain crisis.  Review of systems:  Review of Systems  Constitutional: Negative.   HENT: Negative.    Respiratory: Negative.    Cardiovascular: Negative.   Gastrointestinal: Negative.   Genitourinary: Negative.   Musculoskeletal:  Positive for back pain and joint pain.  Skin: Negative.   Neurological: Negative.   Psychiatric/Behavioral: Negative.      With Past  History of the following :   Past Medical History:  Diagnosis Date   Anxiety    Headache(784.0)    Heart murmur    Sickle cell crisis (HCC)    Syphilis 2015   Was diagnosed and received one injection of antibiotics   Thrombocytosis 11/22/2014     CBC Latest Ref Rng & Units 12/13/2018 12/11/2018 12/10/2018 WBC 4.0 - 10.5 K/uL 14.8(H) 13.2(H) 15.9(H) Hemoglobin 12.0 - 15.0 g/dL 8.2(L) 7.7(L) 8.1(L) Hematocrit 36.0 - 46.0 % 23.7(L) 23.2(L) 24.5(L) Platelets 150 - 400 K/uL 326 399 388        Past Surgical History:  Procedure Laterality Date   CESAREAN SECTION N/A 02/05/2019   Procedure: CESAREAN SECTION;  Surgeon: Hermina Staggers, MD;  Location: [facility] LD ORS;  Service: Obstetrics;  Laterality: N/A;   CHOLECYSTECTOMY N/A 11/30/2014   Procedure: LAPAROSCOPIC CHOLECYSTECTOMY SINGLE SITE WITH INTRAOPERATIVE CHOLANGIOGRAM;  Surgeon: Karie Soda, MD;  Location: [facility] ORS;  Service: General;  Laterality: N/A;   SPLENECTOMY       Social History:   Social History   Tobacco Use   Smoking status: Some Days    Types: Cigarettes   Smokeless tobacco: Never  Substance Use Topics   Alcohol use: No     Lives - At home   Family History :   Family History  Problem Relation Age of Onset   Hypertension Mother    Sickle cell anemia Sister    Kidney disease Sister        Lupus  Arthritis Sister    Sickle cell anemia Sister    Sickle cell trait Sister    Heart disease Maternal Aunt        CABG   Heart disease Maternal Uncle        CABG   Lupus Sister      Home Medications:   Prior to Admission medications   Medication Sig Start Date End Date Taking? Authorizing Provider  DULoxetine (CYMBALTA) 30 MG capsule Take 2 capsules (60 mg total) by mouth daily. Patient taking differently: Take 60 mg by mouth at bedtime. 11/18/22 05/17/23 Yes Massie Maroon, FNP  folic acid (FOLVITE) 1 MG tablet Take 1 tablet (1 mg total) by mouth daily. 03/15/19  Yes Mike Gip, FNP  morphine (MS CONTIN) 60 MG 12 hr  tablet Take 1 tablet (60 mg total) by mouth every 12 (twelve) hours. 01/21/23  Yes Massie Maroon, FNP  Oxycodone HCl 10 MG TABS Take 1 tablet (10 mg total) by mouth every 6 (six) hours as needed (pain). 02/03/23  Yes Ivonne Andrew, NP  Vitamin D, Ergocalciferol, (DRISDOL) 1.25 MG (50000 UNIT) CAPS capsule TAKE ONE CAPSULE BY MOUTH ONE TIME PER WEEK Patient taking differently: Take 50,000 Units by mouth every Saturday. 08/24/22  Yes Massie Maroon, FNP  betamethasone valerate ointment (VALISONE) 0.1 % APPLY TO AFFECTED AREA TWICE A DAY Patient not taking: Reported on 05/10/2022 04/07/22   Massie Maroon, FNP  cyclobenzaprine (FLEXERIL) 10 MG tablet Take 1 tablet (10 mg total) by mouth at bedtime. TAKE 1 TABLET BY MOUTH DAILY AS NEEDED FOR MUSCLE SPASMS. Patient taking differently: Take 10 mg by mouth daily as needed for muscle spasms. 11/18/22   Massie Maroon, FNP     Allergies:   Allergies  Allergen Reactions   Ketamine Hives, Nausea And Vomiting and Other (See Comments)    Makes the patient "feel funny" also     Physical Exam:   Vitals:   Vitals:   02/15/23 1307 02/15/23 1504  BP: 123/76 117/61  Pulse: 77 75  Resp: 16 15  Temp: 98 F (36.7 C)   SpO2: 100% 98%    Physical Exam: Constitutional: Patient appears well-developed and well-nourished. Not in obvious distress. HENT: Normocephalic, atraumatic, External right and left ear normal. Oropharynx is clear and moist.  Eyes: Conjunctivae and EOM are normal. PERRLA, no scleral icterus. Neck: Normal ROM. Neck supple. No JVD. No tracheal deviation. No thyromegaly. CVS: RRR, S1/S2 +, no murmurs, no gallops, no carotid bruit.  Pulmonary: Effort and breath sounds normal, no stridor, rhonchi, wheezes, rales.  Abdominal: Soft. BS +, no distension, tenderness, rebound or guarding.  Musculoskeletal: Normal range of motion. No edema and no tenderness.  Lymphadenopathy: No lymphadenopathy noted, cervical, inguinal or  axillary Neuro: Alert. Normal reflexes, muscle tone coordination. No cranial nerve deficit. Skin: Skin is warm and dry. No rash noted. Not diaphoretic. No erythema. No pallor. Psychiatric: Normal mood and affect. Behavior, judgment, thought content normal.   Data Review:   CBC Recent Labs  Lab 02/15/23 1055  WBC 9.7  HGB 10.5*  HCT 29.5*  PLT 540*  MCV 83.8  MCH 29.8  MCHC 35.6  RDW 13.2  LYMPHSABS 2.5  MONOABS 1.4*  EOSABS 0.2  BASOSABS 0.0   ------------------------------------------------------------------------------------------------------------------  Chemistries  Recent Labs  Lab 02/15/23 1055  NA 135  K 4.0  CL 106  CO2 24  GLUCOSE 90  BUN 12  CREATININE 0.62  CALCIUM 9.0  AST 35  ALT 24  ALKPHOS 63  BILITOT 1.1   ------------------------------------------------------------------------------------------------------------------ CrCl cannot be calculated (Unknown ideal weight.). ------------------------------------------------------------------------------------------------------------------ No results for input(s): "TSH", "T4TOTAL", "T3FREE", "THYROIDAB" in the last 72 hours.  Invalid input(s): "FREET3"  Coagulation profile No results for input(s): "INR", "PROTIME" in the last 168 hours. ------------------------------------------------------------------------------------------------------------------- No results for input(s): "DDIMER" in the last 72 hours. -------------------------------------------------------------------------------------------------------------------  Cardiac Enzymes No results for input(s): "CKMB", "TROPONINI", "MYOGLOBIN" in the last 168 hours.  Invalid input(s): "CK" ------------------------------------------------------------------------------------------------------------------ No results found for:  "BNP"  ---------------------------------------------------------------------------------------------------------------  Urinalysis    Component Value Date/Time   COLORURINE YELLOW 09/11/2022 1510   APPEARANCEUR CLEAR 09/11/2022 1510   LABSPEC 1.013 09/11/2022 1510   PHURINE 5.0 09/11/2022 1510   GLUCOSEU NEGATIVE 09/11/2022 1510   HGBUR NEGATIVE 09/11/2022 1510   BILIRUBINUR NEGATIVE 09/11/2022 1510   BILIRUBINUR negative 08/26/2021 1527   BILIRUBINUR Negative 08/10/2019 1534   KETONESUR NEGATIVE 09/11/2022 1510   PROTEINUR NEGATIVE 09/11/2022 1510   UROBILINOGEN 1.0 08/26/2021 1527   UROBILINOGEN 1.0 01/12/2018 0943   NITRITE NEGATIVE 09/11/2022 1510   LEUKOCYTESUR NEGATIVE 09/11/2022 1510    ----------------------------------------------------------------------------------------------------------------   Imaging Results:    No results found.   Assessment & Plan:  Principal Problem:   Sickle cell pain crisis Active Problems:   Anemia of chronic disease   Major depressive disorder   Chronic pain syndrome  Sickle cell disease with pain crisis: Admit patient.  Continue IV Dilaudid PCA with settings of 0.5 mg, 10-minute lockout, and 3 mg/h. Toradol 15 mg IV every 6 hours Will restart patient's home MS Contin 60 mg every 12 hours Hold oxycodone, will restart as pain intensity improves IV fluids, 0.45% saline at 100 mL/h Monitor vital signs very closely, reevaluate pain scale regularly, and supplemental oxygen as needed. Patient will be reevaluated for pain in the context of function and relationship to baseline as care progresses.  Chronic pain syndrome: Continue home pain medications  Anemia of chronic disease: Patient's hemoglobin is 10.5 g/dL.  This appears to be consistent with her baseline.  There is no clinical indication for blood transfusion at this time.  Will monitor closely.  History of depression: Continue home medications.  No suicidal homicidal  intentions.  Monitor closely.  Thrombocytosis: Platelets elevated.  Secondary to sickle cell disease.  Will continue to monitor closely.   DVT Prophylaxis: Subcut Lovenox   AM Labs Ordered, also please review Full Orders  Family Communication: Admission, patient's condition and plan of care including tests being ordered have been discussed with the patient who indicate understanding and agree with the plan and Code Status.  Code Status: Full Code  Consults called: None    Admission status: Inpatient    Time spent in minutes : 30 minutes  Nolon Nations  APRN, MSN, FNP-C Patient Care [facility] [address] [place], [place] [address] [phone]  02/15/2023 at 3:09 PM

## 2023-02-16 LAB — CBC
HCT: 29.8 % — ABNORMAL LOW (ref 36.0–46.0)
Hemoglobin: 10.2 g/dL — ABNORMAL LOW (ref 12.0–15.0)
MCH: 28.8 pg (ref 26.0–34.0)
MCHC: 34.2 g/dL (ref 30.0–36.0)
MCV: 84.2 fL (ref 80.0–100.0)
Platelets: 459 10*3/uL — ABNORMAL HIGH (ref 150–400)
RBC: 3.54 MIL/uL — ABNORMAL LOW (ref 3.87–5.11)
RDW: 13.6 % (ref 11.5–15.5)
WBC: 11.1 10*3/uL — ABNORMAL HIGH (ref 4.0–10.5)
nRBC: 0.2 % (ref 0.0–0.2)

## 2023-02-16 NOTE — Plan of Care (Addendum)
A/ox4. Independent on room air. Received nausea medicine once over night. PCA and call button within reach. Will continue to monitor.    Problem: Activity: Goal: Risk for activity intolerance will decrease Outcome: Progressing   Problem: Nutrition: Goal: Adequate nutrition will be maintained Outcome: Progressing   Problem: Coping: Goal: Level of anxiety will decrease Outcome: Progressing   Problem: Elimination: Goal: Will not experience complications related to bowel motility Outcome: Progressing Goal: Will not experience complications related to urinary retention Outcome: Progressing   Problem: Pain Managment: Goal: General experience of comfort will improve Outcome: Progressing   Problem: Safety: Goal: Ability to remain free from injury will improve Outcome: Progressing   Problem: Skin Integrity: Goal: Risk for impaired skin integrity will decrease Outcome: Progressing

## 2023-02-17 DIAGNOSIS — D5700 Hb-SS disease with crisis, unspecified: Secondary | ICD-10-CM | POA: Diagnosis not present

## 2023-02-17 LAB — CBC
HCT: 25.2 % — ABNORMAL LOW (ref 36.0–46.0)
Hemoglobin: 9 g/dL — ABNORMAL LOW (ref 12.0–15.0)
MCH: 29.7 pg (ref 26.0–34.0)
MCHC: 35.7 g/dL (ref 30.0–36.0)
MCV: 83.2 fL (ref 80.0–100.0)
Platelets: 396 10*3/uL (ref 150–400)
RBC: 3.03 MIL/uL — ABNORMAL LOW (ref 3.87–5.11)
RDW: 15 % (ref 11.5–15.5)
WBC: 14 10*3/uL — ABNORMAL HIGH (ref 4.0–10.5)
nRBC: 0.2 % (ref 0.0–0.2)

## 2023-02-17 MED ORDER — OXYCODONE HCL 5 MG PO TABS
10.0000 mg | ORAL_TABLET | Freq: Four times a day (QID) | ORAL | Status: DC | PRN
  Administered 2023-02-18 – 2023-02-21 (×7): 10 mg via ORAL
  Filled 2023-02-17 (×7): qty 2

## 2023-02-17 NOTE — Progress Notes (Signed)
Subjective: Carmen [name] is a 32 year old female with a medical history significant for sickle cell disease, chronic syndrome, opiate dependence and tolerance, bipolar depression, and history of anemia of chronic disease was admitted for sickle cell pain crisis. Today, patient continues to have pain primarily to her low back.  She rates pain as 6/10.  Patient not able to manage at home at this time.  Denies any shortness of breath, urinary symptoms, headache, fever, chills, chest pain, urinary symptoms, nausea, vomiting, or diarrhea.                                         Objective:  Vital signs in last 24 hours:  Vitals:   02/17/23 1048 02/17/23 1334 02/17/23 2057 02/17/23 2105  BP: 124/84 115/69 122/78   Pulse: 89 88 90   Resp: Temp: 98.3 F (36.8 C) 98 F (36.7 C) 98 F (36.7 C)   TempSrc: Oral Oral    SpO2: 94% 97% 93% 93%    Intake/Output from previous day:   Intake/Output Summary (Last 24 hours) at 02/17/2023 2210 Last data filed at 02/17/2023 1048 Gross per 24 hour  Intake 240 ml  Output --  Net 240 ml    Physical Exam: General: Alert, awake, oriented x3, in no acute distress.  HEENT: [place]/AT PEERL, EOMI Neck: Trachea midline,  no masses, no thyromegal,y no JVD, no carotid bruit OROPHARYNX:  Moist, No exudate/ erythema/lesions.  Heart: Regular rate and rhythm, without murmurs, rubs, gallops, PMI non-displaced, no heaves or thrills on palpation.  Lungs: Clear to auscultation, no wheezing or rhonchi noted. No increased vocal fremitus resonant to percussion  Abdomen: Soft, nontender, nondistended, positive bowel sounds, no masses no hepatosplenomegaly noted..  Neuro: No focal neurological deficits noted cranial nerves II through XII grossly intact. DTRs 2+ bilaterally upper and lower extremities. Strength 5 out of 5 in bilateral upper and lower extremities. Musculoskeletal: No warm swelling or erythema around joints, no spinal tenderness noted. Psychiatric:  Patient alert and oriented x3, good insight and cognition, good recent to remote recall.  Lab Results:  Basic Metabolic Panel:    Component Value Date/Time   NA 135 02/15/2023 1055   NA 142 08/24/2022 1213   K 4.0 02/15/2023 1055   CL 106 02/15/2023 1055   CO2 24 02/15/2023 1055   BUN 12 02/15/2023 1055   BUN 10 08/24/2022 1213   CREATININE 0.62 02/15/2023 1055   GLUCOSE 90 02/15/2023 1055   CALCIUM 9.0 02/15/2023 1055   CBC:    Component Value Date/Time   WBC 14.0 (H) 02/17/2023 0623   HGB 9.0 (L) 02/17/2023 0623   HGB 9.7 (L) 08/24/2022 1213   HCT 25.2 (L) 02/17/2023 0623   HCT 30.1 (L) 08/24/2022 1213   PLT 396 02/17/2023 0623   PLT 477 (H) 08/24/2022 1213   MCV 83.2 02/17/2023 0623   MCV 88 08/24/2022 1213   NEUTROABS 5.5 02/15/2023 1055   NEUTROABS 3.3 08/24/2022 1213   LYMPHSABS 2.5 02/15/2023 1055   LYMPHSABS 2.8 08/24/2022 1213   MONOABS 1.4 (H) 02/15/2023 1055   EOSABS 0.2 02/15/2023 1055   EOSABS 0.2 08/24/2022 1213   BASOSABS 0.0 02/15/2023 1055   BASOSABS 0.1 08/24/2022 1213    No results found for this or any previous visit (from the past 240 hour(s)).  Studies/Results: No results found.  Medications: Scheduled Meds:  DULoxetine  60 mg Oral Daily   enoxaparin (LOVENOX) injection  40 mg Subcutaneous Q24H   folic acid  1 mg Oral Daily   HYDROmorphone   Intravenous Q4H   ketorolac  15 mg Intravenous Q6H   morphine  60 mg Oral Q12H   senna-docusate  1 tablet Oral BID   Continuous Infusions:  sodium chloride 100 mL/hr at 02/16/23 0850   PRN Meds:.diphenhydrAMINE, naloxone **AND** sodium chloride flush, ondansetron (ZOFRAN) IV, polyethylene glycol  Consultants: none  Procedures: none  Antibiotics: none  Assessment/Plan: Principal Problem:   Sickle cell pain crisis Active Problems:   Anemia of chronic disease   Major depressive disorder   Chronic pain syndrome Sickle cell disease with pain crisis: Continue IV Dilaudid PCA Restart  oxycodone 10 mg every 6 hours as needed for severe breakthrough pain MS Contin 60 mg every 12 hours Toradol 15 mg IV every 6 hours Decrease IV fluids to [facility] Monitor vital signs very closely, reevaluate pain scale regularly, and supplemental oxygen as needed  Chronic pain syndrome: Continue home medications  Anemia of chronic disease: Hemoglobin is stable and consistent with patient's baseline.  There is no clinical indication for blood transfusion at this time.  Monitor closely.  History of bipolar depression: Continue home medications.  No suicidal or homicidal intentions.  Monitor closely.  Code Status: Full Code Family Communication: N/A Disposition Plan: Not yet ready for discharge.  Discharge is planned for 02/17/2022  Nolon Nations  APRN, MSN, FNP-C Patient Care [facility] [address] [place], [place] [address] [phone]  If 7PM-7AM, please contact night-coverage.  02/17/2023, 10:10 PM  LOS: 2 days

## 2023-02-18 DIAGNOSIS — D5700 Hb-SS disease with crisis, unspecified: Secondary | ICD-10-CM | POA: Diagnosis not present

## 2023-02-18 MED ORDER — SODIUM CHLORIDE 0.9 % IV SOLN
12.5000 mg | Freq: Once | INTRAVENOUS | Status: AC
  Administered 2023-02-18: 12.5 mg via INTRAVENOUS
  Filled 2023-02-18: qty 12.5

## 2023-02-18 NOTE — Progress Notes (Signed)
Subjective: Carmen [name] is a 32 year old female with a medical history significant for sickle cell disease, chronic syndrome, opiate dependence and tolerance, bipolar depression, and history of anemia of chronic disease was admitted for sickle cell pain crisis. Today, patient continues to have pain primarily to her low back. She is requesting another day in the hospital.   Patient not able to manage at home at this time.  Denies any shortness of breath, urinary symptoms, headache, fever, chills, chest pain, urinary symptoms, nausea, vomiting, or diarrhea.                                         Objective:  Vital signs in last 24 hours:  Vitals:   02/18/23 1202 02/18/23 1418 02/18/23 1514 02/18/23 1905  BP:  123/78  (!) 121/98  Pulse:  80  93  Resp: Temp:  98.2 F (36.8 C)  98.6 F (37 C)  TempSrc:  Oral  Oral  SpO2: 96% 93% 95% 94%  Weight:      Height:        Intake/Output from previous day:   Intake/Output Summary (Last 24 hours) at 02/18/2023 2008 Last data filed at 02/18/2023 1418 Gross per 24 hour  Intake 1070 ml  Output --  Net 1070 ml    Physical Exam: General: Alert, awake, oriented x3, in no acute distress.  HEENT: [place]/AT PEERL, EOMI Neck: Trachea midline,  no masses, no thyromegal,y no JVD, no carotid bruit OROPHARYNX:  Moist, No exudate/ erythema/lesions.  Heart: Regular rate and rhythm, without murmurs, rubs, gallops, PMI non-displaced, no heaves or thrills on palpation.  Lungs: Clear to auscultation, no wheezing or rhonchi noted. No increased vocal fremitus resonant to percussion  Abdomen: Soft, nontender, nondistended, positive bowel sounds, no masses no hepatosplenomegaly noted..  Neuro: No focal neurological deficits noted cranial nerves II through XII grossly intact. DTRs 2+ bilaterally upper and lower extremities. Strength 5 out of 5 in bilateral upper and lower extremities. Musculoskeletal: No warm swelling or erythema around joints, no spinal  tenderness noted. Psychiatric: Patient alert and oriented x3, good insight and cognition, good recent to remote recall.  Lab Results:  Basic Metabolic Panel:    Component Value Date/Time   NA 135 02/15/2023 1055   NA 142 08/24/2022 1213   K 4.0 02/15/2023 1055   CL 106 02/15/2023 1055   CO2 24 02/15/2023 1055   BUN 12 02/15/2023 1055   BUN 10 08/24/2022 1213   CREATININE 0.62 02/15/2023 1055   GLUCOSE 90 02/15/2023 1055   CALCIUM 9.0 02/15/2023 1055   CBC:    Component Value Date/Time   WBC 14.0 (H) 02/17/2023 0623   HGB 9.0 (L) 02/17/2023 0623   HGB 9.7 (L) 08/24/2022 1213   HCT 25.2 (L) 02/17/2023 0623   HCT 30.1 (L) 08/24/2022 1213   PLT 396 02/17/2023 0623   PLT 477 (H) 08/24/2022 1213   MCV 83.2 02/17/2023 0623   MCV 88 08/24/2022 1213   NEUTROABS 5.5 02/15/2023 1055   NEUTROABS 3.3 08/24/2022 1213   LYMPHSABS 2.5 02/15/2023 1055   LYMPHSABS 2.8 08/24/2022 1213   MONOABS 1.4 (H) 02/15/2023 1055   EOSABS 0.2 02/15/2023 1055   EOSABS 0.2 08/24/2022 1213   BASOSABS 0.0 02/15/2023 1055   BASOSABS 0.1 08/24/2022 1213    No results found for this or any previous visit (from the  past 240 hour(s)).  Studies/Results: No results found.  Medications: Scheduled Meds:  DULoxetine  60 mg Oral Daily   enoxaparin (LOVENOX) injection  40 mg Subcutaneous Q24H   folic acid  1 mg Oral Daily   HYDROmorphone   Intravenous Q4H   ketorolac  15 mg Intravenous Q6H   morphine  60 mg Oral Q12H   senna-docusate  1 tablet Oral BID   Continuous Infusions:  sodium chloride 10 mL/hr at 02/18/23 1937   PRN Meds:.diphenhydrAMINE, naloxone **AND** sodium chloride flush, ondansetron (ZOFRAN) IV, oxyCODONE, polyethylene glycol  Consultants: none  Procedures: none  Antibiotics: none  Assessment/Plan: Principal Problem:   Sickle cell pain crisis Active Problems:   Anemia of chronic disease   Major depressive disorder   Chronic pain syndrome Sickle cell disease with pain  crisis: Continue IV Dilaudid PCA Oxycodone 10 mg every 6 hours as needed for severe breakthrough pain MS Contin 60 mg every 12 hours Toradol 15 mg IV every 6 hours Monitor vital signs very closely, reevaluate pain scale regularly, and supplemental oxygen as needed  Chronic pain syndrome: Continue home medications  Anemia of chronic disease: Hemoglobin is stable and consistent with patient's baseline.  There is no clinical indication for blood transfusion at this time.  Monitor closely.  History of bipolar depression: Continue home medications.  No suicidal or homicidal intentions.  Monitor closely.  Code Status: Full Code Family Communication: N/A Disposition Plan: Not yet ready for discharge.  Discharge is planned for 02/18/2022  Nolon Nations  APRN, MSN, FNP-C Patient Care [facility] [address] [place], [place] [address] [phone]  If 7PM-7AM, please contact night-coverage.  02/18/2023, 8:08 PM  LOS: 3 days

## 2023-02-18 NOTE — TOC Progression Note (Signed)
Transition of Care [facility]) - Progression Note    Patient Details  Name: Carmen [name] MRN: [id number] Date of Birth: 1990-12-20  Transition of Care [facility]) CM/SW Contact  Beckie Busing, RN Phone Number:[phone]  02/18/2023, 12:25 PM  Clinical Narrative:     Transition of Care [facility]) Screening Note   Patient Details  Name: Carmen [name] Date of Birth: 02-Oct-1991   Transition of Care [facility]) CM/SW Contact:    Beckie Busing, RN Phone Number: 02/18/2023, 12:25 PM    Transition of Care Department [facility]) has reviewed patient and no TOC needs have been identified at this time. We will continue to monitor patient advancement through interdisciplinary progression rounds. If new patient transition needs arise, please place a TOC consult.          Expected Discharge Plan and Services                                               Social Determinants of Health (SDOH) Interventions SDOH Screenings   Food Insecurity: No Food Insecurity (02/15/2023)  Housing: Low Risk  (02/15/2023)  Transportation Needs: Unmet Transportation Needs (02/15/2023)  Utilities: Not At Risk (02/15/2023)  Depression (PHQ2-9): High Risk (11/23/2022)  Tobacco Use: High Risk (02/15/2023)    Readmission Risk Interventions    09/13/2022    2:06 PM 09/13/2022    9:03 AM 08/16/2022    2:56 PM  Readmission Risk Prevention Plan  Transportation Screening Complete Complete Complete  Medication Review [employer])  Complete Complete  PCP or Specialist appointment within 3-5 days of discharge  Complete Complete  HRI or Home Care Consult  Complete Complete  SW Recovery Care/Counseling Consult  Complete Complete  Palliative Care Screening  Not Applicable Not Applicable  Skilled Nursing Facility  Not Applicable Not Applicable

## 2023-02-19 DIAGNOSIS — D5700 Hb-SS disease with crisis, unspecified: Secondary | ICD-10-CM | POA: Diagnosis not present

## 2023-02-19 NOTE — Progress Notes (Signed)
SICKLE CELL SERVICE PROGRESS NOTE  Carmen [name] RUE:[id number] DOB: 03/02/1991 DOA: 02/15/2023 PCP: Massie Maroon, FNP  Assessment/Plan: Principal Problem:   Sickle cell pain crisis Active Problems:   Anemia of chronic disease   Major depressive disorder   Chronic pain syndrome  Sickle cell pain crisis: Patient on Toradol, Dilaudid PCA, MS Contin and oral oxycodone.  Pain is said to be 7 out of 10.  Will continue.  IV fluid has been KVO  Anemia of chronic disease: H&H appears to be stable.  Continue to monitor Chronic pain syndrome: Continue MS Contin. Depression with anxiety: Counseling provided.  Continue to monitor.  Continue duloxetine  Code Status: Full code Family Communication: No family at bedside Disposition Plan: Home  [facility]  Pager [phone]. If 7PM-7AM, please contact night-coverage.  02/19/2023, 8:14 AM  LOS: 4 days   Brief narrative: Carmen [name]  is a 32 y.o. female with a medical history significant for sickle cell disease, chronic pain syndrome, opiate dependence and tolerance, depression, and anemia of chronic disease presented to sickle cell day infusion clinic with complaints of generalized pain.  Pain is consistent with previous sickle cell crisis.  Patient attributes pain crisis to increased outside activity during her son's birthday party.  Pain intensity has been increasing over the past week and unrelieved by her home medications.  Patient has been taking MS Contin and oxycodone consistently.  Patient is not on any disease modifying agents at this time.  She rates pain as 8/10, constant, and throbbing.  Pain is mostly to her low back and lower extremities.  Patient denies any dizziness, fever, chills, headache, or shortness of breath.  No urinary symptoms, nausea, vomiting, or diarrhea   Consultants: None  Procedures: None  Antibiotics: None  HPI/Subjective: Patient is still having pain at 7 out of 10.  No new issues.  No fever or chills no  nausea vomiting or diarrhea  Objective: Vitals:   02/19/23 0040 02/19/23 0252 02/19/23 0504 02/19/23 0700  BP:  125/67  (!) 123/94  Pulse:  78  95  Resp: Temp:  98.1 F (36.7 C)  98.5 F (36.9 C)  TempSrc:  Axillary  Oral  SpO2: 95% 93% 93% 94%  Weight:      Height:       Weight change:   Intake/Output Summary (Last 24 hours) at 02/19/2023 0814 Last data filed at 02/19/2023 0214 Gross per 24 hour  Intake 561 ml  Output --  Net 561 ml    General: Alert, awake, oriented x3, in no acute distress.  HEENT: [place]/AT PEERL, EOMI Neck: Trachea midline,  no masses, no thyromegal,y no JVD, no carotid bruit OROPHARYNX:  Moist, No exudate/ erythema/lesions.  Heart: Regular rate and rhythm, without murmurs, rubs, gallops, PMI non-displaced, no heaves or thrills on palpation.  Lungs: Clear to auscultation, no wheezing or rhonchi noted. No increased vocal fremitus resonant to percussion  Abdomen: Soft, nontender, nondistended, positive bowel sounds, no masses no hepatosplenomegaly noted..  Neuro: No focal neurological deficits noted cranial nerves II through XII grossly intact. DTRs 2+ bilaterally upper and lower extremities. Strength 5 out of 5 in bilateral upper and lower extremities. Musculoskeletal: No warm swelling or erythema around joints, no spinal tenderness noted. Psychiatric: Patient alert and oriented x3, good insight and cognition, good recent to remote recall. Lymph node survey: No cervical axillary or inguinal lymphadenopathy noted.   Data Reviewed: Basic Metabolic Panel: Recent Labs  Lab 02/15/23 1055  NA 135  K 4.0  CL 106  CO2 24  GLUCOSE 90  BUN 12  CREATININE 0.62  CALCIUM 9.0   Liver Function Tests: Recent Labs  Lab 02/15/23 1055  AST 35  ALT 24  ALKPHOS 63  BILITOT 1.1  PROT 8.0  ALBUMIN 4.2   No results for input(s): "LIPASE", "AMYLASE" in the last 168 hours. No results for input(s): "AMMONIA" in the last 168 hours. CBC: Recent Labs   Lab 02/15/23 1055 02/16/23 0718 02/17/23 0623  WBC 9.7 11.1* 14.0*  NEUTROABS 5.5  --   --   HGB 10.5* 10.2* 9.0*  HCT 29.5* 29.8* 25.2*  MCV 83.8 84.2 83.2  PLT 540* 459* 396   Cardiac Enzymes: No results for input(s): "CKTOTAL", "CKMB", "CKMBINDEX", "TROPONINI" in the last 168 hours. BNP (last 3 results) No results for input(s): "BNP" in the last 8760 hours.  ProBNP (last 3 results) No results for input(s): "PROBNP" in the last 8760 hours.  CBG: No results for input(s): "GLUCAP" in the last 168 hours.  No results found for this or any previous visit (from the past 240 hour(s)).   Studies: No results found.  Scheduled Meds:  DULoxetine  60 mg Oral Daily   enoxaparin (LOVENOX) injection  40 mg Subcutaneous Q24H   folic acid  1 mg Oral Daily   HYDROmorphone   Intravenous Q4H   ketorolac  15 mg Intravenous Q6H   morphine  60 mg Oral Q12H   senna-docusate  1 tablet Oral BID   Continuous Infusions:  sodium chloride 10 mL/hr at 02/19/23 0353    Principal Problem:   Sickle cell pain crisis Active Problems:   Anemia of chronic disease   Major depressive disorder   Chronic pain syndrome

## 2023-02-19 NOTE — Plan of Care (Signed)
  Problem: Self-Care: Goal: Ability to incorporate actions that prevent/reduce pain crisis will improve Outcome: Progressing   Problem: Bowel/Gastric: Goal: Gut motility will be maintained Outcome: Progressing   Problem: Sensory: Goal: Pain level will decrease with appropriate interventions Outcome: Progressing   Problem: Health Behavior: Goal: Postive changes in compliance with treatment and prescription regimens will improve Outcome: Progressing   Problem: Coping: Goal: Level of anxiety will decrease Outcome: Progressing   Problem: Elimination: Goal: Will not experience complications related to bowel motility Outcome: Progressing Goal: Will not experience complications related to urinary retention Outcome: Progressing

## 2023-02-20 DIAGNOSIS — D5700 Hb-SS disease with crisis, unspecified: Secondary | ICD-10-CM | POA: Diagnosis not present

## 2023-02-20 LAB — COMPREHENSIVE METABOLIC PANEL
ALT: 52 U/L — ABNORMAL HIGH (ref 0–44)
AST: 60 U/L — ABNORMAL HIGH (ref 15–41)
Albumin: 4 g/dL (ref 3.5–5.0)
Alkaline Phosphatase: 64 U/L (ref 38–126)
Anion gap: 14 (ref 5–15)
BUN: 11 mg/dL (ref 6–20)
CO2: 23 mmol/L (ref 22–32)
Calcium: 9.2 mg/dL (ref 8.9–10.3)
Chloride: 104 mmol/L (ref 98–111)
Creatinine, Ser: 0.5 mg/dL (ref 0.44–1.00)
GFR, Estimated: >60 mL/min (ref >60)
Glucose, Bld: 95 mg/dL (ref 70–99)
Potassium: 4 mmol/L (ref 3.5–5.1)
Sodium: 141 mmol/L (ref 135–145)
Total Bilirubin: 1.7 mg/dL — ABNORMAL HIGH (ref 0.3–1.2)
Total Protein: 7.6 g/dL (ref 6.5–8.1)

## 2023-02-20 LAB — CBC WITH DIFFERENTIAL/PLATELET
Abs Immature Granulocytes: 0.11 10*3/uL — ABNORMAL HIGH (ref 0.00–0.07)
Basophils Absolute: 0.1 10*3/uL (ref 0.0–0.1)
Basophils Relative: 1 %
Eosinophils Absolute: 0.4 10*3/uL (ref 0.0–0.5)
Eosinophils Relative: 4 %
HCT: 26 % — ABNORMAL LOW (ref 36.0–46.0)
Hemoglobin: 9.1 g/dL — ABNORMAL LOW (ref 12.0–15.0)
Immature Granulocytes: 1 %
Lymphocytes Relative: 43 %
Lymphs Abs: 4.4 10*3/uL — ABNORMAL HIGH (ref 0.7–4.0)
MCH: 30 pg (ref 26.0–34.0)
MCHC: 35 g/dL (ref 30.0–36.0)
MCV: 85.8 fL (ref 80.0–100.0)
Monocytes Absolute: 1.2 10*3/uL — ABNORMAL HIGH (ref 0.1–1.0)
Monocytes Relative: 12 %
Neutro Abs: 3.8 10*3/uL (ref 1.7–7.7)
Neutrophils Relative %: 39 %
Platelets: 375 10*3/uL (ref 150–400)
RBC: 3.03 MIL/uL — ABNORMAL LOW (ref 3.87–5.11)
RDW: 17.2 % — ABNORMAL HIGH (ref 11.5–15.5)
WBC: 9.9 10*3/uL (ref 4.0–10.5)
nRBC: 7.9 % — ABNORMAL HIGH (ref 0.0–0.2)

## 2023-02-20 NOTE — Plan of Care (Signed)

## 2023-02-20 NOTE — Progress Notes (Signed)
SICKLE CELL SERVICE PROGRESS NOTE  Carmen [name] HQI:[id number] DOB: January 27, 1991 DOA: 02/15/2023 PCP: Massie Maroon, FNP  Assessment/Plan: Principal Problem:   Sickle cell pain crisis Active Problems:   Anemia of chronic disease   Major depressive disorder   Chronic pain syndrome  Sickle cell pain crisis: Patient on Toradol, Dilaudid PCA, MS Contin and oral oxycodone.  Pain is said to be 6 out of 10.  Will continue.  IV fluid has been KVO  Anemia of chronic disease: H&H appears to be stable.  Continue to monitor Chronic pain syndrome: Continue MS Contin. Depression with anxiety: Counseling provided.  Continue to monitor.  Continue duloxetine Disposition: Titrating medication down.  Patient may be ready for discharge by tomorrow  Code Status: Full code Family Communication: No family at bedside Disposition Plan: Home  [facility]  Pager [phone]. If 7PM-7AM, please contact night-coverage.  02/20/2023, 7:33 AM  LOS: 5 days   Brief narrative: Carmen [name]  is a 31 y.o. female with a medical history significant for sickle cell disease, chronic pain syndrome, opiate dependence and tolerance, depression, and anemia of chronic disease presented to sickle cell day infusion clinic with complaints of generalized pain.  Pain is consistent with previous sickle cell crisis.  Patient attributes pain crisis to increased outside activity during her son's birthday party.  Pain intensity has been increasing over the past week and unrelieved by her home medications.  Patient has been taking MS Contin and oxycodone consistently.  Patient is not on any disease modifying agents at this time.  She rates pain as 8/10, constant, and throbbing.  Pain is mostly to her low back and lower extremities.  Patient denies any dizziness, fever, chills, headache, or shortness of breath.  No urinary symptoms, nausea, vomiting, or diarrhea    Consultants: None  Procedures: None  Antibiotics: None  HPI/Subjective: Patient is still having pain at 6 out of 10.  No new issues.  No fever or chills no nausea vomiting or diarrhea.  She is thinks her pain is better and could be ready for discharge by tomorrow  Objective: Vitals:   02/19/23 2150 02/20/23 0005 02/20/23 0204 02/20/23 0422  BP: 125/84  115/71   Pulse: 98  74   Resp: Temp: 98.7 F (37.1 C)  97.9 F (36.6 C)   TempSrc: Oral     SpO2: 95% 95%  95%  Weight:      Height:       Weight change:   Intake/Output Summary (Last 24 hours) at 02/20/2023 0733 Last data filed at 02/20/2023 0456 Gross per 24 hour  Intake 1199.95 ml  Output --  Net 1199.95 ml     General: Alert, awake, oriented x3, in no acute distress.  HEENT: [place]/AT PEERL, EOMI Neck: Trachea midline,  no masses, no thyromegal,y no JVD, no carotid bruit OROPHARYNX:  Moist, No exudate/ erythema/lesions.  Heart: Regular rate and rhythm, without murmurs, rubs, gallops, PMI non-displaced, no heaves or thrills on palpation.  Lungs: Clear to auscultation, no wheezing or rhonchi noted. No increased vocal fremitus resonant to percussion  Abdomen: Soft, nontender, nondistended, positive bowel sounds, no masses no hepatosplenomegaly noted..  Neuro: No focal neurological deficits noted cranial nerves II through XII grossly intact. DTRs 2+ bilaterally upper and lower extremities. Strength 5 out of 5 in bilateral upper and lower extremities. Musculoskeletal: No warm swelling or erythema around joints, no spinal tenderness noted. Psychiatric: Patient alert and oriented x3, good insight and cognition,  good recent to remote recall. Lymph node survey: No cervical axillary or inguinal lymphadenopathy noted.   Data Reviewed: Basic Metabolic Panel: Recent Labs  Lab 02/15/23 1055  NA 135  K 4.0  CL 106  CO2 24  GLUCOSE 90  BUN 12  CREATININE 0.62  CALCIUM 9.0    Liver Function Tests: Recent  Labs  Lab 02/15/23 1055  AST 35  ALT 24  ALKPHOS 63  BILITOT 1.1  PROT 8.0  ALBUMIN 4.2    No results for input(s): "LIPASE", "AMYLASE" in the last 168 hours. No results for input(s): "AMMONIA" in the last 168 hours. CBC: Recent Labs  Lab 02/15/23 1055 02/16/23 0718 02/17/23 0623  WBC 9.7 11.1* 14.0*  NEUTROABS 5.5  --   --   HGB 10.5* 10.2* 9.0*  HCT 29.5* 29.8* 25.2*  MCV 83.8 84.2 83.2  PLT 540* 459* 396    Cardiac Enzymes: No results for input(s): "CKTOTAL", "CKMB", "CKMBINDEX", "TROPONINI" in the last 168 hours. BNP (last 3 results) No results for input(s): "BNP" in the last 8760 hours.  ProBNP (last 3 results) No results for input(s): "PROBNP" in the last 8760 hours.  CBG: No results for input(s): "GLUCAP" in the last 168 hours.  No results found for this or any previous visit (from the past 240 hour(s)).   Studies: No results found.  Scheduled Meds:  DULoxetine  60 mg Oral Daily   enoxaparin (LOVENOX) injection  40 mg Subcutaneous Q24H   folic acid  1 mg Oral Daily   HYDROmorphone   Intravenous Q4H   ketorolac  15 mg Intravenous Q6H   morphine  60 mg Oral Q12H   senna-docusate  1 tablet Oral BID   Continuous Infusions:  sodium chloride 10 mL/hr at 02/20/23 0456    Principal Problem:   Sickle cell pain crisis Active Problems:   Anemia of chronic disease   Major depressive disorder   Chronic pain syndrome

## 2023-02-21 ENCOUNTER — Other Ambulatory Visit: Payer: Self-pay

## 2023-02-21 ENCOUNTER — Other Ambulatory Visit: Payer: Self-pay | Admitting: Family Medicine

## 2023-02-21 DIAGNOSIS — D5700 Hb-SS disease with crisis, unspecified: Secondary | ICD-10-CM | POA: Diagnosis not present

## 2023-02-21 DIAGNOSIS — Z79891 Long term (current) use of opiate analgesic: Secondary | ICD-10-CM

## 2023-02-21 DIAGNOSIS — D571 Sickle-cell disease without crisis: Secondary | ICD-10-CM

## 2023-02-21 MED ORDER — MORPHINE SULFATE ER 60 MG PO TBCR
60.0000 mg | EXTENDED_RELEASE_TABLET | Freq: Two times a day (BID) | ORAL | 0 refills | Status: DC
Start: 2023-02-21 — End: 2023-03-24

## 2023-02-21 MED ORDER — OXYCODONE HCL 10 MG PO TABS
10.0000 mg | ORAL_TABLET | Freq: Four times a day (QID) | ORAL | 0 refills | Status: DC | PRN
Start: 2023-02-21 — End: 2023-03-08

## 2023-02-21 NOTE — Telephone Encounter (Signed)
Please advise KH 

## 2023-02-21 NOTE — Progress Notes (Addendum)
Patient chooses to sign herself out against medical advice due to needing to care for her son. IV catheter removed intact and AMA form signed, in chart. Dilaudid PCA syringe wasted in steri cycle with Arline Asp, [employer] fund[employer].

## 2023-02-21 NOTE — Progress Notes (Signed)
Reviewed PDMP substance reporting system prior to prescribing opiate medications. No inconsistencies noted.  Meds ordered this encounter  Medications   morphine (MS CONTIN) 60 MG 12 hr tablet    Sig: Take 1 tablet (60 mg total) by mouth every 12 (twelve) hours.    Dispense:  60 tablet    Refill:  0    Order Specific Question:   Supervising Provider    Answer:   [name], [name] [[id number]]   Oxycodone HCl 10 MG TABS    Sig: Take 1 tablet (10 mg total) by mouth every 6 (six) hours as needed (pain).    Dispense:  60 tablet    Refill:  0    Order Specific Question:   Supervising Provider    Answer:   [name], [name] [[id number]]   Carmen [name] Carmen Westerhold  APRN, MSN, FNP-C [facility] [address]  [place], [place] [address] [phone]  

## 2023-02-21 NOTE — Telephone Encounter (Signed)
From: [name] To: Office of [name], [place] Sent: 02/21/2023 12:03 PM EDT Subject: Medication Renewal Request  Refills have been requested for the following medications:   morphine (MS CONTIN) 60 MG 12 hr tablet [Lachina Hollis]  Preferred pharmacy: [id number] - [place], [place] - [address] AT [address] Delivery method: Pickup   Medication renewals requested in this message routed separately:   Oxycodone HCl 10 MG TABS [Tonya S Nichols]

## 2023-02-23 ENCOUNTER — Telehealth: Payer: Self-pay

## 2023-02-23 NOTE — Transitions of Care (Post Inpatient/ED Visit) (Signed)
   02/23/2023  Name: Carmen [name] MRN: [id number] DOB: 1991-10-24  Today's TOC FU Call Status:    Attempted to reach the patient regarding the most recent Inpatient/ED visit.  Follow Up Plan: Additional outreach attempts will be made to reach the patient to complete the Transitions of Care (Post Inpatient/ED visit) call.   Signature Karena Addison, LPN [facility] Nurse Health Advisor Direct Dial [phone]

## 2023-02-24 ENCOUNTER — Other Ambulatory Visit: Payer: Self-pay

## 2023-02-25 NOTE — Discharge Summary (Signed)
Physician Discharge Summary  Carmen [name]:[id number] DOB: 1991/02/20 DOA: 02/15/2023  PCP: Massie Maroon, FNP  Admit date: 02/15/2023  Discharge date: 02/25/2023  Discharge Diagnoses:  Principal Problem:   Sickle cell pain crisis (HCC) Active Problems:   Anemia of chronic disease   Major depressive disorder   Chronic pain syndrome   Discharge Condition: Left AGAINST MEDICAL ADVICE  Disposition:  Diet: Regular Wt Readings from Last 3 Encounters:  02/18/23 87.6 kg  01/06/23 90.3 kg  11/23/22 87.6 kg   Carmen [name]  is a 32 y.o. female with a medical history significant for sickle cell disease, chronic pain syndrome, opiate dependence and tolerance, depression, and anemia of chronic disease presented to sickle cell day infusion clinic with complaints of generalized pain.  Pain is consistent with previous sickle cell crisis.  Patient attributes pain crisis to increased outside activity during her son's birthday party.  Pain intensity has been increasing over the past week and unrelieved by her home medications.  Patient has been taking MS Contin and oxycodone consistently.  Patient is not on any disease modifying agents at this time.  She rates pain as 8/10, constant, and throbbing.  Pain is mostly to her low back and lower extremities.  Patient denies any dizziness, fever, chills, headache, or shortness of breath.  No urinary symptoms, nausea, vomiting, or diarrhea.   Sickle cell center course: Vital signs show: BP 117/61 (BP Location: Right Arm)   Pulse 75   Temp 98 F (36.7 C) (Temporal)   Resp 15   SpO2 98%  Complete blood count notable for hemoglobin 10.5 and platelets 540,000.  Complete metabolic panel unremarkable.  Patient's pain persists despite IV Dilaudid, IV fluids, and IV Toradol.  Patient will be admitted to [facility] for further management and evaluation of her sickle cell pain crisis.   Hospital Course:  Sickle cell disease with pain  crisis: Patient was admitted for sickle cell pain crisis and managed appropriately with IVF, IV Dilaudid via PCA and IV Toradol, as well as other adjunct therapies per sickle cell pain management protocols.  Prior to leaving AGAINST MEDICAL ADVICE patient was in hemodynamically stable condition.  Discharge Exam: Vitals:   02/21/23 0419 02/21/23 0629  BP: 134/87   Pulse: 83   Resp: 16 16  Temp: 97.6 F (36.4 C)   SpO2: 100% 100%   Vitals:   02/21/23 0127 02/21/23 0416 02/21/23 0419 02/21/23 0629  BP:   134/87   Pulse:   83   Resp: 16 16 16 16   Temp:   97.6 F (36.4 C)   TempSrc:   Oral   SpO2: 100% 100% 100% 100%  Weight:      Height:        General appearance : Awake, alert, not in any distress. Speech Clear. Not toxic looking HEENT: Atraumatic and Normocephalic, pupils equally reactive to light and accomodation Neck: Supple, no JVD. No cervical lymphadenopathy.  Chest: Good air entry bilaterally, no added sounds  CVS: S1 S2 regular, no murmurs.  Abdomen: Bowel sounds present, Non tender and not distended with no gaurding, rigidity or rebound. Extremities: B/L Lower Ext shows no edema, both legs are warm to touch Neurology: Awake alert, and oriented X 3, CN II-XII intact, Non focal Skin: No Rash  Discharge Instructions   Allergies as of 02/21/2023       Reactions   Ketamine Hives, Nausea And Vomiting, Other (See Comments)   Makes the patient "feel funny" also  Medication List     ASK your doctor about these medications    betamethasone valerate ointment 0.1 % Commonly known as: VALISONE APPLY TO AFFECTED AREA TWICE A DAY   cyclobenzaprine 10 MG tablet Commonly known as: FLEXERIL Take 1 tablet (10 mg total) by mouth at bedtime. TAKE 1 TABLET BY MOUTH DAILY AS NEEDED FOR MUSCLE SPASMS.   DULoxetine 30 MG capsule Commonly known as: CYMBALTA Take 2 capsules (60 mg total) by mouth daily.   folic acid 1 MG tablet Commonly known as: FOLVITE Take 1  tablet (1 mg total) by mouth daily.   Vitamin D (Ergocalciferol) 1.25 MG (50000 UNIT) Caps capsule Commonly known as: DRISDOL TAKE ONE CAPSULE BY MOUTH ONE TIME PER WEEK        The results of significant diagnostics from this hospitalization (including imaging, microbiology, ancillary and laboratory) are listed below for reference.    Significant Diagnostic Studies: No results found.  Microbiology: No results found for this or any previous visit (from the past 240 hour(s)).   Labs: Basic Metabolic Panel: Recent Labs  Lab 02/20/23 1923  NA 141  K 4.0  CL 104  CO2 23  GLUCOSE 95  BUN 11  CREATININE 0.50  CALCIUM 9.2   Liver Function Tests: Recent Labs  Lab 02/20/23 1923  AST 60*  ALT 52*  ALKPHOS 64  BILITOT 1.7*  PROT 7.6  ALBUMIN 4.0   No results for input(s): "LIPASE", "AMYLASE" in the last 168 hours. No results for input(s): "AMMONIA" in the last 168 hours. CBC: Recent Labs  Lab 02/20/23 1923  WBC 9.9  NEUTROABS 3.8  HGB 9.1*  HCT 26.0*  MCV 85.8  PLT 375   Cardiac Enzymes: No results for input(s): "CKTOTAL", "CKMB", "CKMBINDEX", "TROPONINI" in the last 168 hours. BNP: Invalid input(s): "POCBNP" CBG: No results for input(s): "GLUCAP" in the last 168 hours.  Time coordinating discharge: Left AGAINST MEDICAL ADVICE  Signed:  Nolon Nations  APRN, MSN, FNP-C Patient Care [facility] [address] [place], [place] [address] [phone]  [facility] 02/25/2023, 12:37 PM

## 2023-02-28 NOTE — Transitions of Care (Post Inpatient/ED Visit) (Signed)
   02/28/2023  Name: ALIYAH ABEYTA MRN: [id number] DOB: 09/19/1991 Patient already seen in office Today's TOC FU Call Status: Today's TOC FU Call Status:: Unsuccessful Call (2nd Attempt) Unsuccessful Call (2nd Attempt) Date: 02/28/23  Attempted to reach the patient regarding the most recent Inpatient/ED visit.  Follow Up Plan: No further outreach attempts will be made at this time. We have been unable to contact the patient.  Signature Karena Addison, LPN [facility] Nurse Health Advisor Direct Dial [phone]

## 2023-03-01 ENCOUNTER — Ambulatory Visit: Payer: Medicaid Other | Admitting: Family Medicine

## 2023-03-03 ENCOUNTER — Ambulatory Visit: Payer: Medicaid Other | Admitting: Family Medicine

## 2023-03-08 ENCOUNTER — Other Ambulatory Visit: Payer: Self-pay | Admitting: Family Medicine

## 2023-03-08 ENCOUNTER — Other Ambulatory Visit: Payer: Self-pay

## 2023-03-08 DIAGNOSIS — D571 Sickle-cell disease without crisis: Secondary | ICD-10-CM

## 2023-03-08 DIAGNOSIS — Z79891 Long term (current) use of opiate analgesic: Secondary | ICD-10-CM

## 2023-03-08 MED ORDER — OXYCODONE HCL 10 MG PO TABS
10.0000 mg | ORAL_TABLET | Freq: Four times a day (QID) | ORAL | 0 refills | Status: DC | PRN
Start: 2023-03-09 — End: 2023-03-24

## 2023-03-08 NOTE — Telephone Encounter (Signed)
Please advise KH 

## 2023-03-08 NOTE — Progress Notes (Signed)
Reviewed PDMP substance reporting system prior to prescribing opiate medications. No inconsistencies noted.   Meds ordered this encounter  Medications  . Oxycodone HCl 10 MG TABS    Sig: Take 1 tablet (10 mg total) by mouth every 6 (six) hours as needed (pain).    Dispense:  60 tablet    Refill:  0    Order Specific Question:   Supervising Provider    Answer:   [name], [name] [[id number]]     Cherlynn Popiel Moore Axelle Szwed  APRN, MSN, FNP-C [facility] [address]  [place], [place] [address] [phone]  

## 2023-03-08 NOTE — Telephone Encounter (Signed)
From: [name] To: Office of [name], [place] Sent: 03/08/2023 2:52 AM EDT Subject: Medication Renewal Request  Refills have been requested for the following medications:   Oxycodone HCl 10 MG TABS [Carmen [name]]  Preferred pharmacy: [id number] - [place], [place] - [address] Delivery method: [employer]

## 2023-03-09 ENCOUNTER — Ambulatory Visit (INDEPENDENT_AMBULATORY_CARE_PROVIDER_SITE_OTHER): Payer: Medicaid Other | Admitting: Nurse Practitioner

## 2023-03-09 VITALS — BP 112/66 | HR 87 | Temp 98.1°F | Ht 64.0 in | Wt 190.6 lb

## 2023-03-09 DIAGNOSIS — D5700 Hb-SS disease with crisis, unspecified: Secondary | ICD-10-CM | POA: Diagnosis not present

## 2023-03-09 DIAGNOSIS — Z720 Tobacco use: Secondary | ICD-10-CM | POA: Diagnosis not present

## 2023-03-09 DIAGNOSIS — Z3042 Encounter for surveillance of injectable contraceptive: Secondary | ICD-10-CM

## 2023-03-09 DIAGNOSIS — Z09 Encounter for follow-up examination after completed treatment for conditions other than malignant neoplasm: Secondary | ICD-10-CM | POA: Diagnosis not present

## 2023-03-09 LAB — POCT URINE PREGNANCY: Preg Test, Ur: NEGATIVE

## 2023-03-09 MED ORDER — MEDROXYPROGESTERONE ACETATE 150 MG/ML IM SUSY
150.0000 mg | PREFILLED_SYRINGE | INTRAMUSCULAR | Status: AC
Start: 2023-03-09 — End: 2023-03-09
  Administered 2023-03-09: 150 mg via INTRAMUSCULAR

## 2023-03-09 NOTE — Assessment & Plan Note (Addendum)
She reports feeling well since her recent hospitalization Continue folic acid 1 mg daily Take hydroxyzine 1000 mg daily as ordered Continue oxycodone and morphine for pain management Importance of hydration and avoiding triggers of infection discussed with the patient seeking emergency care when needed discussed. Follow-up with PCP in 3 months and maintain close follow-up with hematologist Urine drug screen ordered

## 2023-03-09 NOTE — Patient Instructions (Signed)
1. Hospital discharge follow-up   2. Sickle cell disease with crisis [facility])  - [id number] 11+Oxyco+Alc+Crt-Bund - Oxycodone/Oxymorphone, Urine - Opiates Confirmation, Urine  3. Tobacco abuse   Thanks for choosing [facility] we consider it a privelige to serve you.

## 2023-03-09 NOTE — Assessment & Plan Note (Addendum)
was on admission at the hospital between 02/15/2023 to 02/25/2023 for sickle cell pain crisis.  Patient reports feeling well today Hospital discharge summary,labs  reviewed today

## 2023-03-09 NOTE — Assessment & Plan Note (Signed)
Can go a week without smoking cigarettes ,a pack lasting 3 days. Started smoking at age 32.  Need to quit smoking cigarettes discussed with the patient she verbalized understanding

## 2023-03-09 NOTE — Progress Notes (Addendum)
Established Patient Office Visit  Subjective:  Patient ID: Carmen [name], female    DOB: 1991-06-19  Age: 32 y.o. MRN: [id number]  CC:  Chief Complaint  Patient presents with   Follow-up    Hospital follow up.   Contraception    HPI Carmen [name] is a 32 y.o. female  has a past medical history of Anxiety, Headache(784.0), Heart murmur, Sickle cell crisis (HCC), Syphilis (2015), and Thrombocytosis (11/22/2014).  Tobacco abuse who presents for hospital discharge follow-up.   Patient was on admission at the hospital between 02/15/2023 to 02/25/2023 for sickle cell pain crisis.  Patient reports feeling well today.  She is currently on hydroxyurea with order written for 1000 mg daily but she states that she has been taking  500 mg daily medication managed by hematology, she is also taking morphine and oxycodone for pain management.  She would like to get her Depo shot for birth control  Patient currently denies fever, chills, malaise, chest pain, shortness of breath, wheezing, nausea, vomiting, headaches, dizziness.     Past Medical History:  Diagnosis Date   Anxiety    Headache(784.0)    Heart murmur    Sickle cell crisis (HCC)    Syphilis 2015   Was diagnosed and received one injection of antibiotics   Thrombocytosis 11/22/2014     CBC Latest Ref Rng & Units 12/13/2018 12/11/2018 12/10/2018 WBC 4.0 - 10.5 K/uL 14.8(H) 13.2(H) 15.9(H) Hemoglobin 12.0 - 15.0 g/dL 8.2(L) 7.7(L) 8.1(L) Hematocrit 36.0 - 46.0 % 23.7(L) 23.2(L) 24.5(L) Platelets 150 - 400 K/uL [phone]      Past Surgical History:  Procedure Laterality Date   CESAREAN SECTION N/A 02/05/2019   Procedure: CESAREAN SECTION;  Surgeon: Hermina Staggers, MD;  Location: [facility];  Service: Obstetrics;  Laterality: N/A;   CHOLECYSTECTOMY N/A 11/30/2014   Procedure: LAPAROSCOPIC CHOLECYSTECTOMY SINGLE SITE WITH INTRAOPERATIVE CHOLANGIOGRAM;  Surgeon: Karie Soda, MD;  Location: [facility];  Service: General;  Laterality: N/A;    SPLENECTOMY      Family History  Problem Relation Age of Onset   Hypertension Mother    Sickle cell anemia Sister    Kidney disease Sister        Lupus   Arthritis Sister    Sickle cell anemia Sister    Sickle cell trait Sister    Heart disease Maternal Aunt        CABG   Heart disease Maternal Uncle        CABG   Lupus Sister     Social History   Socioeconomic History   Marital status: Single    Spouse name: Not on file   Number of children: Not on file   Years of education: Not on file   Highest education level: 12th grade  Occupational History   Not on file  Tobacco Use   Smoking status: Some Days    Types: Cigarettes   Smokeless tobacco: Never  Vaping Use   Vaping Use: Never used  Substance and Sexual Activity   Alcohol use: No   Drug use: No   Sexual activity: Yes    Birth control/protection: Injection  Other Topics Concern   Not on file  Social History Narrative   Not on file   Social Determinants of Health   Financial Resource Strain: Low Risk  (03/09/2023)   Overall Financial Resource Strain (CARDIA)    Difficulty of Paying Living Expenses: Not hard at all  Food Insecurity: No Food  Insecurity (03/09/2023)   Hunger Vital Sign    Worried About Running Out of Food in the Last Year: Never true    Ran Out of Food in the Last Year: Never true  Transportation Needs: Unmet Transportation Needs (03/09/2023)   PRAPARE - Administrator, Civil Service (Medical): Yes    Lack of Transportation (Non-Medical): No  Physical Activity: Insufficiently Active (03/09/2023)   Exercise Vital Sign    Days of Exercise per Week: 2 days    Minutes of Exercise per Session: 20 min  Stress: No Stress Concern Present (03/09/2023)   [employer] of Occupational Health - Occupational Stress Questionnaire    Feeling of Stress : Not at all  Social Connections: Moderately Isolated (03/09/2023)   Social Connection and Isolation Panel [NHANES]    Frequency of Communication  with Friends and Family: More than three times a week    Frequency of Social Gatherings with Friends and Family: More than three times a week    Attends Religious Services: 1 to 4 times per year    Active Member of [employer] or Organizations: No    Attends Engineer, structural: Not on file    Marital Status: Never married  Intimate Partner Violence: Not At Risk (02/15/2023)   Humiliation, Afraid, Rape, and Kick questionnaire    Fear of Current or Ex-Partner: No    Emotionally Abused: No    Physically Abused: No    Sexually Abused: No    Outpatient Medications Prior to Visit  Medication Sig Dispense Refill   DULoxetine (CYMBALTA) 30 MG capsule Take 2 capsules (60 mg total) by mouth daily. (Patient taking differently: Take 60 mg by mouth at bedtime.) 180 capsule 1   folic acid (FOLVITE) 1 MG tablet Take 1 tablet (1 mg total) by mouth daily. 90 tablet 3   hydroxyurea (HYDREA) 500 MG capsule Take by mouth.     morphine (MS CONTIN) 60 MG 12 hr tablet Take 1 tablet (60 mg total) by mouth every 12 (twelve) hours. 60 tablet 0   Oxycodone HCl 10 MG TABS Take 1 tablet (10 mg total) by mouth every 6 (six) hours as needed (pain). 60 tablet 0   Vitamin D, Ergocalciferol, (DRISDOL) 1.25 MG (50000 UNIT) CAPS capsule TAKE ONE CAPSULE BY MOUTH ONE TIME PER WEEK (Patient taking differently: Take 50,000 Units by mouth every Saturday.) 30 capsule 1   betamethasone valerate ointment (VALISONE) 0.1 % APPLY TO AFFECTED AREA TWICE A DAY (Patient not taking: Reported on 05/10/2022) 30 g 1   cyclobenzaprine (FLEXERIL) 10 MG tablet Take 1 tablet (10 mg total) by mouth at bedtime. TAKE 1 TABLET BY MOUTH DAILY AS NEEDED FOR MUSCLE SPASMS. (Patient not taking: Reported on 03/09/2023) 30 tablet 2   No facility-administered medications prior to visit.    Allergies  Allergen Reactions   Ketamine Hives, Nausea And Vomiting and Other (See Comments)    Makes the patient "feel funny" also    ROS Review of Systems   Constitutional:  Negative for activity change, appetite change, chills, fatigue and fever.  HENT:  Negative for congestion, dental problem, ear discharge, ear pain, hearing loss, rhinorrhea, sinus pressure, sinus pain, sneezing and sore throat.   Eyes:  Negative for pain, discharge, redness and itching.  Respiratory:  Negative for cough, chest tightness, shortness of breath and wheezing.   Cardiovascular:  Negative for chest pain, palpitations and leg swelling.  Gastrointestinal:  Negative for abdominal distention, abdominal pain, anal bleeding, blood in  stool, constipation, diarrhea, nausea, rectal pain and vomiting.  Endocrine: Negative for cold intolerance, heat intolerance, polydipsia, polyphagia and polyuria.  Genitourinary:  Negative for difficulty urinating, dysuria, flank pain, frequency, hematuria, menstrual problem, pelvic pain and vaginal bleeding.  Musculoskeletal:  Negative for arthralgias, back pain, gait problem, joint swelling and myalgias.  Skin:  Negative for color change, pallor, rash and wound.  Allergic/Immunologic: Negative for environmental allergies, food allergies and immunocompromised state.  Neurological:  Negative for dizziness, tremors, facial asymmetry, weakness and headaches.  Hematological:  Negative for adenopathy. Does not bruise/bleed easily.  Psychiatric/Behavioral:  Negative for agitation, behavioral problems, confusion, decreased concentration, hallucinations, self-injury and suicidal ideas.       Objective:    Physical Exam Vitals and nursing note reviewed.  Constitutional:      General: She is not in acute distress.    Appearance: Normal appearance. She is obese. She is not ill-appearing, toxic-appearing or diaphoretic.  HENT:     Mouth/Throat:     Mouth: Mucous membranes are moist.     Pharynx: Oropharynx is clear. No oropharyngeal exudate or posterior oropharyngeal erythema.  Eyes:     General: Scleral icterus present.        Right eye: No  discharge.        Left eye: No discharge.     Extraocular Movements: Extraocular movements intact.     Conjunctiva/sclera: Conjunctivae normal.  Cardiovascular:     Rate and Rhythm: Normal rate and regular rhythm.     Pulses: Normal pulses.     Heart sounds: Normal heart sounds. No murmur heard.    No friction rub. No gallop.  Pulmonary:     Effort: Pulmonary effort is normal. No respiratory distress.     Breath sounds: Normal breath sounds. No stridor. No wheezing, rhonchi or rales.  Chest:     Chest wall: No tenderness.  Abdominal:     General: There is no distension.     Palpations: Abdomen is soft.     Tenderness: There is no abdominal tenderness. There is no right CVA tenderness, left CVA tenderness or guarding.  Musculoskeletal:        General: No swelling, tenderness, deformity or signs of injury.     Right lower leg: No edema.     Left lower leg: No edema.  Skin:    General: Skin is warm and dry.     Capillary Refill: Capillary refill takes less than 2 seconds.     Coloration: Skin is not jaundiced or pale.     Findings: No bruising, erythema or lesion.  Neurological:     Mental Status: She is alert and oriented to person, place, and time.     Motor: No weakness.     Coordination: Coordination normal.     Gait: Gait normal.  Psychiatric:        Mood and Affect: Mood normal.        Behavior: Behavior normal.        Thought Content: Thought content normal.        Judgment: Judgment normal.     BP 112/66   Pulse 87   Temp 98.1 F (36.7 C)   Ht 5\' 4"  (1.626 m)   Wt 190 lb 9.6 oz (86.5 kg)   LMP 03/01/2023   SpO2 97%   BMI 32.72 kg/m  Wt Readings from Last 3 Encounters:  03/09/23 190 lb 9.6 oz (86.5 kg)  02/18/23 193 lb 2 oz (87.6 kg)  01/06/23 199 lb (  90.3 kg)    No results found for: "TSH" Lab Results  Component Value Date   WBC 9.9 02/20/2023   HGB 9.1 (L) 02/20/2023   HCT 26.0 (L) 02/20/2023   MCV 85.8 02/20/2023   PLT 375 02/20/2023   Lab  Results  Component Value Date   NA 141 02/20/2023   K 4.0 02/20/2023   CO2 23 02/20/2023   GLUCOSE 95 02/20/2023   BUN 11 02/20/2023   CREATININE 0.50 02/20/2023   BILITOT 1.7 (H) 02/20/2023   ALKPHOS 64 02/20/2023   AST 60 (H) 02/20/2023   ALT 52 (H) 02/20/2023   PROT 7.6 02/20/2023   ALBUMIN 4.0 02/20/2023   CALCIUM 9.2 02/20/2023   ANIONGAP 14 02/20/2023   EGFR 123 08/24/2022   No results found for: "CHOL" No results found for: "HDL" No results found for: "LDLCALC" No results found for: "TRIG" No results found for: "CHOLHDL" No results found for: "HGBA1C"    Assessment & Plan:   Problem List Items Addressed This Visit       Other   Sickle cell disease with crisis (HCC) - Primary    She reports feeling well since her recent hospitalization Continue folic acid 1 mg daily Take hydroxyzine 1000 mg daily as ordered Continue oxycodone and morphine for pain management Importance of hydration and avoiding triggers of infection discussed with the patient seeking emergency care when needed discussed. Follow-up with PCP in 3 months and maintain close follow-up with hematologist Urine drug screen ordered      Relevant Orders   [id number] 11+Oxyco+Alc+Crt-Bund   Oxycodone/Oxymorphone, Urine   Opiates Confirmation, Urine   Tobacco abuse    Can go a week without smoking cigarettes ,a pack lasting 3 days. Started smoking at age 50.  Need to quit smoking cigarettes discussed with the patient she verbalized understanding      Hospital discharge follow-up    was on admission at the hospital between 02/15/2023 to 02/25/2023 for sickle cell pain crisis.  Patient reports feeling well today Hospital discharge summary,labs  reviewed today      Other Visit Diagnoses     Encounter for surveillance of injectable contraceptive       Relevant Medications   medroxyPROGESTERone Acetate SUSY 150 mg (Completed)   Other Relevant Orders   POCT urine pregnancy (Completed)       Meds  ordered this encounter  Medications   medroxyPROGESTERone Acetate SUSY 150 mg    Follow-up: Return in about 3 months (around 06/09/2023) for sickle cell.    Donell Beers, FNP

## 2023-03-11 LAB — DRUG SCREEN 764883 11+OXYCO+ALC+CRT-BUND

## 2023-03-15 LAB — OPIATES CONFIRMATION, URINE
Codeine: NEGATIVE
Hydrocodone: NEGATIVE
Hydromorphone: NEGATIVE
Morphine Confirm: >3000 ng/mL
Morphine: POSITIVE — AB
Opiates: POSITIVE ng/mL — AB

## 2023-03-15 LAB — DRUG SCREEN 764883 11+OXYCO+ALC+CRT-BUND
BENZODIAZ UR QL: NEGATIVE ng/mL
Barbiturate: NEGATIVE ng/mL
Cocaine (Metabolite): NEGATIVE ng/mL
Creatinine: 95.5 mg/dL (ref 20.0–300.0)
Ethanol: NEGATIVE %
Propoxyphene: NEGATIVE ng/mL
Tramadol: NEGATIVE ng/mL

## 2023-03-15 LAB — CANNABINOID CONFIRMATION, UR
CANNABINOIDS: POSITIVE — AB
Carboxy THC GC/MS Conf: 310 ng/mL

## 2023-03-15 LAB — OXYCODONE/OXYMORPHONE, URINE

## 2023-03-15 LAB — OXYCODONE/OXYMORPHONE, CONFIRM: OXYCODONE/OXYMORPH: NEGATIVE

## 2023-03-17 ENCOUNTER — Encounter: Payer: Medicaid Other | Admitting: Obstetrics and Gynecology

## 2023-03-21 ENCOUNTER — Inpatient Hospital Stay (HOSPITAL_COMMUNITY)
Admission: EM | Admit: 2023-03-21 | Discharge: 2023-03-24 | DRG: 812 | Disposition: A | Discharge status: Home / Self Care | Payer: Medicaid Other | Attending: Internal Medicine | Admitting: Internal Medicine

## 2023-03-21 ENCOUNTER — Encounter (HOSPITAL_COMMUNITY): Payer: Self-pay

## 2023-03-21 ENCOUNTER — Other Ambulatory Visit: Payer: Self-pay

## 2023-03-21 DIAGNOSIS — D638 Anemia in other chronic diseases classified elsewhere: Secondary | ICD-10-CM | POA: Diagnosis present

## 2023-03-21 DIAGNOSIS — Z8261 Family history of arthritis: Secondary | ICD-10-CM | POA: Diagnosis not present

## 2023-03-21 DIAGNOSIS — Z832 Family history of diseases of the blood and blood-forming organs and certain disorders involving the immune mechanism: Secondary | ICD-10-CM | POA: Diagnosis not present

## 2023-03-21 DIAGNOSIS — F1120 Opioid dependence, uncomplicated: Secondary | ICD-10-CM | POA: Diagnosis present

## 2023-03-21 DIAGNOSIS — Z79899 Other long term (current) drug therapy: Secondary | ICD-10-CM | POA: Diagnosis not present

## 2023-03-21 DIAGNOSIS — D5700 Hb-SS disease with crisis, unspecified: Secondary | ICD-10-CM | POA: Diagnosis not present

## 2023-03-21 DIAGNOSIS — Z79891 Long term (current) use of opiate analgesic: Secondary | ICD-10-CM

## 2023-03-21 DIAGNOSIS — F319 Bipolar disorder, unspecified: Secondary | ICD-10-CM | POA: Diagnosis present

## 2023-03-21 DIAGNOSIS — Z8249 Family history of ischemic heart disease and other diseases of the circulatory system: Secondary | ICD-10-CM | POA: Diagnosis not present

## 2023-03-21 DIAGNOSIS — Z841 Family history of disorders of kidney and ureter: Secondary | ICD-10-CM

## 2023-03-21 DIAGNOSIS — Z9081 Acquired absence of spleen: Secondary | ICD-10-CM

## 2023-03-21 DIAGNOSIS — G894 Chronic pain syndrome: Secondary | ICD-10-CM | POA: Diagnosis present

## 2023-03-21 LAB — COMPREHENSIVE METABOLIC PANEL
ALT: 24 U/L (ref 0–44)
AST: 25 U/L (ref 15–41)
Albumin: 4.3 g/dL (ref 3.5–5.0)
Alkaline Phosphatase: 60 U/L (ref 38–126)
Anion gap: 7 (ref 5–15)
BUN: 14 mg/dL (ref 6–20)
CO2: 22 mmol/L (ref 22–32)
Calcium: 9.5 mg/dL (ref 8.9–10.3)
Chloride: 108 mmol/L (ref 98–111)
Creatinine, Ser: 0.7 mg/dL (ref 0.44–1.00)
GFR, Estimated: >60 mL/min (ref >60)
Glucose, Bld: 101 mg/dL — ABNORMAL HIGH (ref 70–99)
Potassium: 3.6 mmol/L (ref 3.5–5.1)
Sodium: 137 mmol/L (ref 135–145)
Total Bilirubin: 1.5 mg/dL — ABNORMAL HIGH (ref 0.3–1.2)
Total Protein: 8.5 g/dL — ABNORMAL HIGH (ref 6.5–8.1)

## 2023-03-21 LAB — CBC WITH DIFFERENTIAL/PLATELET
Abs Immature Granulocytes: 0.02 10*3/uL (ref 0.00–0.07)
Basophils Absolute: 0.1 10*3/uL (ref 0.0–0.1)
Basophils Relative: 1 %
Eosinophils Absolute: 0.1 10*3/uL (ref 0.0–0.5)
Eosinophils Relative: 1 %
HCT: 28 % — ABNORMAL LOW (ref 36.0–46.0)
Hemoglobin: 9.9 g/dL — ABNORMAL LOW (ref 12.0–15.0)
Immature Granulocytes: 0 %
Lymphocytes Relative: 37 %
Lymphs Abs: 2.9 10*3/uL (ref 0.7–4.0)
MCH: 29.3 pg (ref 26.0–34.0)
MCHC: 35.4 g/dL (ref 30.0–36.0)
MCV: 82.8 fL (ref 80.0–100.0)
Monocytes Absolute: 1 10*3/uL (ref 0.1–1.0)
Monocytes Relative: 12 %
Neutro Abs: 3.8 10*3/uL (ref 1.7–7.7)
Neutrophils Relative %: 49 %
Platelets: 491 10*3/uL — ABNORMAL HIGH (ref 150–400)
RBC: 3.38 MIL/uL — ABNORMAL LOW (ref 3.87–5.11)
RDW: 13.2 % (ref 11.5–15.5)
WBC: 7.9 10*3/uL (ref 4.0–10.5)
nRBC: 0.3 % — ABNORMAL HIGH (ref 0.0–0.2)

## 2023-03-21 LAB — I-STAT BETA HCG BLOOD, ED (MC, WL, AP ONLY): I-stat hCG, quantitative: <5 m[IU]/mL (ref <5)

## 2023-03-21 LAB — RETICULOCYTES
Immature Retic Fract: 32.2 % — ABNORMAL HIGH (ref 2.3–15.9)
RBC.: 3.37 MIL/uL — ABNORMAL LOW (ref 3.87–5.11)
Retic Count, Absolute: 132.4 10*3/uL (ref 19.0–186.0)
Retic Ct Pct: 3.9 % — ABNORMAL HIGH (ref 0.4–3.1)

## 2023-03-21 MED ORDER — HYDROMORPHONE HCL 2 MG/ML IJ SOLN
2.0000 mg | INTRAMUSCULAR | Status: DC
  Filled 2023-03-21: qty 1

## 2023-03-21 MED ORDER — ENOXAPARIN SODIUM 40 MG/0.4ML IJ SOSY
40.0000 mg | PREFILLED_SYRINGE | Freq: Every day | INTRAMUSCULAR | Status: DC
  Filled 2023-03-21 (×3): qty 0.4

## 2023-03-21 MED ORDER — ONDANSETRON HCL 4 MG/2ML IJ SOLN
4.0000 mg | INTRAMUSCULAR | Status: DC | PRN
  Administered 2023-03-21: 4 mg via INTRAVENOUS
  Filled 2023-03-21: qty 2

## 2023-03-21 MED ORDER — HYDROXYUREA 500 MG PO CAPS
500.0000 mg | ORAL_CAPSULE | Freq: Every day | ORAL | Status: DC
  Administered 2023-03-21 – 2023-03-23 (×3): 500 mg via ORAL
  Filled 2023-03-21 (×3): qty 1

## 2023-03-21 MED ORDER — NALOXONE HCL 0.4 MG/ML IJ SOLN: 0.4000 mg | INTRAMUSCULAR | Status: DC | PRN

## 2023-03-21 MED ORDER — HYDROMORPHONE HCL 2 MG/ML IJ SOLN
2.0000 mg | INTRAMUSCULAR | Status: AC
  Administered 2023-03-21 (×2): 2 mg via INTRAVENOUS
  Filled 2023-03-21: qty 1

## 2023-03-21 MED ORDER — HYDROMORPHONE HCL 2 MG/ML IJ SOLN: 2.0000 mg | INTRAMUSCULAR | Status: DC

## 2023-03-21 MED ORDER — HYDROMORPHONE HCL 2 MG/ML IJ SOLN: 2.0000 mg | INTRAMUSCULAR | Status: DC | PRN

## 2023-03-21 MED ORDER — SODIUM CHLORIDE 0.45 % IV SOLN: INTRAVENOUS | Status: DC

## 2023-03-21 MED ORDER — KETOROLAC TROMETHAMINE 15 MG/ML IJ SOLN
15.0000 mg | Freq: Four times a day (QID) | INTRAMUSCULAR | Status: DC
  Administered 2023-03-21 – 2023-03-24 (×12): 15 mg via INTRAVENOUS
  Filled 2023-03-21 (×12): qty 1

## 2023-03-21 MED ORDER — POLYETHYLENE GLYCOL 3350 17 G PO PACK: 17.0000 g | PACK | Freq: Every day | ORAL | Status: DC | PRN

## 2023-03-21 MED ORDER — DIPHENHYDRAMINE HCL 25 MG PO CAPS: 25.0000 mg | ORAL_CAPSULE | ORAL | Status: DC | PRN

## 2023-03-21 MED ORDER — DIPHENHYDRAMINE HCL 25 MG PO CAPS
25.0000 mg | ORAL_CAPSULE | ORAL | Status: DC | PRN
  Administered 2023-03-21: 25 mg via ORAL
  Filled 2023-03-21 (×2): qty 1

## 2023-03-21 MED ORDER — SENNOSIDES-DOCUSATE SODIUM 8.6-50 MG PO TABS
1.0000 | ORAL_TABLET | Freq: Two times a day (BID) | ORAL | Status: DC
  Administered 2023-03-21 – 2023-03-23 (×6): 1 via ORAL
  Filled 2023-03-21 (×6): qty 1

## 2023-03-21 MED ORDER — KETOROLAC TROMETHAMINE 15 MG/ML IJ SOLN
15.0000 mg | INTRAMUSCULAR | Status: AC
  Administered 2023-03-21: 15 mg via INTRAVENOUS
  Filled 2023-03-21: qty 1

## 2023-03-21 MED ORDER — HYDROMORPHONE 1 MG/ML IV SOLN
INTRAVENOUS | Status: DC
  Administered 2023-03-21: 7 mg via INTRAVENOUS
  Administered 2023-03-21: 30 mg via INTRAVENOUS
  Administered 2023-03-22: 7 mg via INTRAVENOUS
  Administered 2023-03-22: 6 mg via INTRAVENOUS
  Administered 2023-03-22: 30 mg via INTRAVENOUS
  Administered 2023-03-22: 4.5 mg via INTRAVENOUS
  Administered 2023-03-23: 12 mg via INTRAVENOUS
  Administered 2023-03-23: 7 mg via INTRAVENOUS
  Administered 2023-03-23: 30 mg via INTRAVENOUS
  Administered 2023-03-23: 9.5 mg via INTRAVENOUS
  Administered 2023-03-23 – 2023-03-24 (×3): 5 mg via INTRAVENOUS
  Administered 2023-03-24: 30 mg via INTRAVENOUS
  Administered 2023-03-24: 7 mg via INTRAVENOUS
  Filled 2023-03-21 (×4): qty 30

## 2023-03-21 MED ORDER — FOLIC ACID 1 MG PO TABS
1.0000 mg | ORAL_TABLET | Freq: Every day | ORAL | Status: DC
  Administered 2023-03-21 – 2023-03-23 (×3): 1 mg via ORAL
  Filled 2023-03-21 (×3): qty 1

## 2023-03-21 MED ORDER — SODIUM CHLORIDE 0.9% FLUSH: 9.0000 mL | INTRAVENOUS | Status: DC | PRN

## 2023-03-21 MED ORDER — MORPHINE SULFATE ER 15 MG PO TBCR
60.0000 mg | EXTENDED_RELEASE_TABLET | Freq: Two times a day (BID) | ORAL | Status: DC
  Administered 2023-03-21 – 2023-03-23 (×6): 60 mg via ORAL
  Filled 2023-03-21 (×6): qty 4

## 2023-03-21 MED ORDER — HYDROMORPHONE HCL 2 MG/ML IJ SOLN
2.0000 mg | INTRAMUSCULAR | Status: AC
  Administered 2023-03-21: 2 mg via SUBCUTANEOUS
  Filled 2023-03-21: qty 1

## 2023-03-21 MED ORDER — DULOXETINE HCL 60 MG PO CPEP
60.0000 mg | ORAL_CAPSULE | Freq: Every day | ORAL | Status: DC
  Administered 2023-03-21 – 2023-03-23 (×3): 60 mg via ORAL
  Filled 2023-03-21 (×3): qty 1

## 2023-03-21 MED ORDER — ONDANSETRON HCL 4 MG/2ML IJ SOLN
4.0000 mg | Freq: Four times a day (QID) | INTRAMUSCULAR | Status: DC | PRN
  Administered 2023-03-21: 4 mg via INTRAVENOUS
  Filled 2023-03-21: qty 2

## 2023-03-21 NOTE — ED Provider Notes (Signed)
Patient care assumed at shift change from Army Melia, PA-C.  For full HPI, please refer to previous providers note.  Plan is to reassess after IV pain medications. Physical Exam  LMP 03/01/2023   Physical Exam Vitals and nursing note reviewed.  Constitutional:      Appearance: Normal appearance.  HENT:     Head: Normocephalic and atraumatic.     Mouth/Throat:     Mouth: Mucous membranes are moist.  Eyes:     General: No scleral icterus. Cardiovascular:     Rate and Rhythm: Normal rate and regular rhythm.     Pulses: Normal pulses.     Heart sounds: Normal heart sounds.  Pulmonary:     Effort: Pulmonary effort is normal.     Breath sounds: Normal breath sounds.  Abdominal:     General: Abdomen is flat.     Palpations: Abdomen is soft.     Tenderness: There is no abdominal tenderness.  Musculoskeletal:        General: No deformity.     Comments: Tenderness to palpation to entire right arm.  Shoulder, elbow, wrist joint with normal range of motion. No swelling noted.  Skin:    General: Skin is warm.     Findings: No rash.  Neurological:     General: No focal deficit present.     Mental Status: She is alert.  Psychiatric:        Mood and Affect: Mood normal.     Procedures  Procedures  ED Course / MDM    Medical Decision Making Amount and/or Complexity of Data Reviewed Labs: ordered.  Risk Prescription drug management. Decision regarding hospitalization.   32 year old female with history of sickle cell anemia presents with complaint of pain in her right arm similar to prior crisis without trauma or injury. Retic count 3.9.  CBC with no leukocytosis.  Hemoglobin 9.9 at baseline.  Reticulocyte count 3.9.  She denies any headache, chest pain or shortness of breath.  Neuroexam with no focal neurological deficit.  Considered but low suspicion for acute chest, stroke, splenic sequestration or other emergent complications of sickle cell disease.  Also low suspicion for  fracture or dislocation given no recent trauma.  Patient was given 3 doses of Dilaudid 2 mg.  Reevaluation of patient after these medications showed that the patient symptoms stayed the same.  Will consult sickle cell team. I spoke to [place] Hollis, NP from sickle cell team, discussed labs and pertinent plan.  She recommended admission for pain control.  Disposition Admit.  This chart was dictated using voice recognition software.  Despite best efforts to proofread, errors can occur which can change the documentation meaning.          Jeanelle Malling, PA 03/21/23 [id number]    Ernie Avena, MD 03/21/23 [phone]

## 2023-03-21 NOTE — ED Provider Notes (Signed)
[place] EMERGENCY DEPARTMENT AT [facility] Provider Note   CSN: [id number] Arrival date & time: 03/21/23  0446     History  Chief Complaint  Patient presents with   Sickle Cell Pain Crisis    Carmen [name] is a 32 y.o. female.  32 year old female with history of sickle cell anemia presents with complaint of pain in her right arm, similar to prior crisis.  Not improving with her oxycodone, morphine, ibuprofen.  Denies recent illness or vomiting, fevers.  Pain is worse with movement of her arm.       Home Medications Prior to Admission medications   Medication Sig Start Date End Date Taking? Authorizing Provider  betamethasone valerate ointment (VALISONE) 0.1 % APPLY TO AFFECTED AREA TWICE A DAY Patient not taking: Reported on 05/10/2022 04/07/22   Massie Maroon, FNP  cyclobenzaprine (FLEXERIL) 10 MG tablet Take 1 tablet (10 mg total) by mouth at bedtime. TAKE 1 TABLET BY MOUTH DAILY AS NEEDED FOR MUSCLE SPASMS. Patient not taking: Reported on 03/09/2023 11/18/22   Massie Maroon, FNP  DULoxetine (CYMBALTA) 30 MG capsule Take 2 capsules (60 mg total) by mouth daily. Patient taking differently: Take 60 mg by mouth at bedtime. 11/18/22 05/17/23  Massie Maroon, FNP  folic acid (FOLVITE) 1 MG tablet Take 1 tablet (1 mg total) by mouth daily. 03/15/19   Mike Gip, FNP  hydroxyurea (HYDREA) 500 MG capsule Take by mouth. 12/30/22 03/30/23  [provider]  morphine (MS CONTIN) 60 MG 12 hr tablet Take 1 tablet (60 mg total) by mouth every 12 (twelve) hours. 02/21/23   Massie Maroon, FNP  Oxycodone HCl 10 MG TABS Take 1 tablet (10 mg total) by mouth every 6 (six) hours as needed (pain). 03/09/23   Massie Maroon, FNP  Vitamin D, Ergocalciferol, (DRISDOL) 1.25 MG (50000 UNIT) CAPS capsule TAKE ONE CAPSULE BY MOUTH ONE TIME PER WEEK Patient taking differently: Take 50,000 Units by mouth every Saturday. 08/24/22   Massie Maroon, FNP      Allergies     Ketamine    Review of Systems   Review of Systems Negative except as per HPI Physical Exam Updated Vital Signs LMP 03/01/2023  Physical Exam Vitals and nursing note reviewed.  Constitutional:      General: She is not in acute distress.    Appearance: She is well-developed. She is not diaphoretic.  HENT:     Head: Normocephalic and atraumatic.  Cardiovascular:     Pulses: Normal pulses.  Pulmonary:     Effort: Pulmonary effort is normal.  Musculoskeletal:        General: Tenderness present. No swelling, deformity or signs of injury.  Skin:    General: Skin is warm and dry.     Capillary Refill: Capillary refill takes less than 2 seconds.     Findings: No erythema or rash.  Neurological:     Mental Status: She is alert and oriented to person, place, and time.     Sensory: No sensory deficit.     Motor: No weakness.  Psychiatric:        Behavior: Behavior normal.     ED Results / Procedures / Treatments   Labs (all labs ordered are listed, but only abnormal results are displayed) Labs Reviewed  CBC WITH DIFFERENTIAL/PLATELET  RETICULOCYTES  COMPREHENSIVE METABOLIC PANEL  I-STAT BETA HCG BLOOD, ED ([facility], [facility], [facility] ONLY)    EKG None  Radiology No results found.  Procedures  Procedures    Medications Ordered in ED Medications  0.45 % sodium chloride infusion ( Intravenous New Bag/Given 03/21/23 0606)  HYDROmorphone (DILAUDID) injection 2 mg (has no administration in time range)  HYDROmorphone (DILAUDID) injection 2 mg (has no administration in time range)  diphenhydrAMINE (BENADRYL) capsule 25-50 mg (25 mg Oral Given 03/21/23 0615)  ondansetron (ZOFRAN) injection 4 mg (4 mg Intravenous Given 03/21/23 4098)  ketorolac (TORADOL) 15 MG/ML injection 15 mg (15 mg Intravenous Given 03/21/23 0608)  HYDROmorphone (DILAUDID) injection 2 mg (2 mg Subcutaneous Given 03/21/23 0615)    ED Course/ Medical Decision Making/ A&P                             Medical Decision  Making Amount and/or Complexity of Data Reviewed Labs: ordered.  Risk Prescription drug management.   32 year old female with history of sickle cell anemia presents with complaint of pain in her right arm similar to prior crisis without trauma, injury.  Not improving with her home pain medications.  On exam, has generalized tenderness of the right arm, more so in the forearm, worse with palpation through the right trapezius area.  There is no appreciable swelling or skin changes, brisk capillary refill present and sensation intact.  Patient is offered pain medications and labs per protocol.  Care signed out at change of shift to oncoming provider pending reevaluation.        Final Clinical Impression(s) / ED Diagnoses Final diagnoses:  Sickle cell anemia with pain [facility])    Rx / DC Orders ED Discharge Orders     None         Alden Hipp 03/21/23 1191    Tilden Fossa, MD 03/21/23 [phone]

## 2023-03-21 NOTE — ED Triage Notes (Signed)
Patient arrived with complaints of generalized right sided body pain, states this feels like the beginning of a SSC

## 2023-03-21 NOTE — Progress Notes (Signed)
Carmen [name] notified [place] refuses her Lovenox, patient states she will walk in the halls.

## 2023-03-22 ENCOUNTER — Encounter: Payer: Medicaid Other | Admitting: Family Medicine

## 2023-03-22 DIAGNOSIS — D5700 Hb-SS disease with crisis, unspecified: Secondary | ICD-10-CM | POA: Diagnosis not present

## 2023-03-22 LAB — BASIC METABOLIC PANEL
Anion gap: 7 (ref 5–15)
BUN: 13 mg/dL (ref 6–20)
CO2: 20 mmol/L — ABNORMAL LOW (ref 22–32)
Calcium: 9 mg/dL (ref 8.9–10.3)
Chloride: 106 mmol/L (ref 98–111)
Creatinine, Ser: 0.6 mg/dL (ref 0.44–1.00)
GFR, Estimated: >60 mL/min (ref >60)
Glucose, Bld: 98 mg/dL (ref 70–99)
Potassium: 3.5 mmol/L (ref 3.5–5.1)
Sodium: 133 mmol/L — ABNORMAL LOW (ref 135–145)

## 2023-03-22 LAB — CBC
HCT: 25 % — ABNORMAL LOW (ref 36.0–46.0)
Hemoglobin: 8.9 g/dL — ABNORMAL LOW (ref 12.0–15.0)
MCH: 29.3 pg (ref 26.0–34.0)
MCHC: 35.6 g/dL (ref 30.0–36.0)
MCV: 82.2 fL (ref 80.0–100.0)
Platelets: 356 10*3/uL (ref 150–400)
RBC: 3.04 MIL/uL — ABNORMAL LOW (ref 3.87–5.11)
RDW: 13.4 % (ref 11.5–15.5)
WBC: 10.4 10*3/uL (ref 4.0–10.5)
nRBC: 0.2 % (ref 0.0–0.2)

## 2023-03-22 NOTE — Progress Notes (Signed)
Subjective: Carmen [name] is a 32 year old female with a medical history significant for sickle cell disease, chronic syndrome, opiate dependence and tolerance, bipolar depression, and history of anemia of chronic disease was admitted for sickle cell pain crisis.   Patient is still complaining of significant pain in her upper extremities especially her left hand around the wrist joint, puffy and throbbing.  No redness.  She rates her pain at 8/10 this morning.  She denies any fever, cough, chest pain, shortness of breath, nausea, vomiting or diarrhea.  No urinary symptoms.  Objective:  Vital signs in last 24 hours:  Vitals:   03/22/23 0603 03/22/23 0757 03/22/23 1007 03/22/23 1016  BP: 110/64  122/69   Pulse: 65  65   Resp: 14 17 18 19   Temp: 98.4 F (36.9 C)  97.9 F (36.6 C)   TempSrc: Oral  Oral   SpO2: 98% 96% 100% 100%  Weight:      Height:        Intake/Output from previous day:   Intake/Output Summary (Last 24 hours) at 03/22/2023 1108 Last data filed at 03/22/2023 0700 Gross per 24 hour  Intake 2101.33 ml  Output --  Net 2101.33 ml    Physical Exam: General: Alert, awake, oriented x3, in no acute distress.  HEENT: [place]/AT PEERL, EOMI Neck: Trachea midline,  no masses, no thyromegal,y no JVD, no carotid bruit OROPHARYNX:  Moist, No exudate/ erythema/lesions.  Heart: Regular rate and rhythm, without murmurs, rubs, gallops, PMI non-displaced, no heaves or thrills on palpation.  Lungs: Clear to auscultation, no wheezing or rhonchi noted. No increased vocal fremitus resonant to percussion  Abdomen: Soft, nontender, nondistended, positive bowel sounds, no masses no hepatosplenomegaly noted..  Neuro: No focal neurological deficits noted cranial nerves II through XII grossly intact. DTRs 2+ bilaterally upper and lower extremities. Strength 5 out of 5 in bilateral upper and lower extremities. Musculoskeletal: No warm swelling or erythema around joints, no spinal tenderness  noted. Psychiatric: Patient alert and oriented x3, good insight and cognition, good recent to remote recall. Lymph node survey: No cervical axillary or inguinal lymphadenopathy noted.  Lab Results:  Basic Metabolic Panel:    Component Value Date/Time   NA 133 (L) 03/22/2023 0550   NA 142 08/24/2022 1213   K 3.5 03/22/2023 0550   CL 106 03/22/2023 0550   CO2 20 (L) 03/22/2023 0550   BUN 13 03/22/2023 0550   BUN 10 08/24/2022 1213   CREATININE 0.60 03/22/2023 0550   GLUCOSE 98 03/22/2023 0550   CALCIUM 9.0 03/22/2023 0550   CBC:    Component Value Date/Time   WBC 10.4 03/22/2023 0550   HGB 8.9 (L) 03/22/2023 0550   HGB 9.7 (L) 08/24/2022 1213   HCT 25.0 (L) 03/22/2023 0550   HCT 30.1 (L) 08/24/2022 1213   PLT 356 03/22/2023 0550   PLT 477 (H) 08/24/2022 1213   MCV 82.2 03/22/2023 0550   MCV 88 08/24/2022 1213   NEUTROABS 3.8 03/21/2023 0555   NEUTROABS 3.3 08/24/2022 1213   LYMPHSABS 2.9 03/21/2023 0555   LYMPHSABS 2.8 08/24/2022 1213   MONOABS 1.0 03/21/2023 0555   EOSABS 0.1 03/21/2023 0555   EOSABS 0.2 08/24/2022 1213   BASOSABS 0.1 03/21/2023 0555   BASOSABS 0.1 08/24/2022 1213    No results found for this or any previous visit (from the past 240 hour(s)).  Studies/Results: No results found.  Medications: Scheduled Meds:  DULoxetine  60 mg Oral Daily   enoxaparin (LOVENOX) injection  40 mg Subcutaneous Daily   folic acid  1 mg Oral Daily   HYDROmorphone   Intravenous Q4H   hydroxyurea  500 mg Oral Daily   ketorolac  15 mg Intravenous Q6H   morphine  60 mg Oral Q12H   senna-docusate  1 tablet Oral BID   Continuous Infusions:  sodium chloride 100 mL/hr at 03/22/23 1020   PRN Meds:.diphenhydrAMINE, naloxone **AND** sodium chloride flush, ondansetron (ZOFRAN) IV, polyethylene glycol  Consultants: None  Procedures: None  Antibiotics: None  Assessment/Plan: Principal Problem:   Sickle cell pain crisis (HCC) Active Problems:   Anemia of chronic  disease   Chronic prescription opiate use  Hb Sickle Cell Disease with Pain crisis: Reduce IVF to [facility], continue weight based Dilaudid PCA at current dose setting, continue IV Toradol 15 mg Q 6 H, continue oral home pain medications as ordered.  Add home oxycodone.  Monitor vitals very closely, Re-evaluate pain scale regularly, 2 L of Oxygen by [place]. Anemia of Chronic Disease: Hemoglobin appears stable at baseline today.  There is no clinical indication for blood transfusion at this time.  Will monitor very closely and transfuse as appropriate. Chronic pain Syndrome: Continue home medications as ordered. Major depressive disorder: Clinically stable.  Patient denies any suicidal ideations or thoughts.  Will continue Cymbalta and follow very closely.  Code Status: Full Code Family Communication: N/A Disposition Plan: Not yet ready for discharge  Carmen [name]  If 7PM-7AM, please contact night-coverage.  03/22/2023, 11:08 AM  LOS: 1 day Patient ID: Carmen [name], female   DOB: 02-18-1991, 32 y.o.   MRN: [id number]

## 2023-03-22 NOTE — H&P (Signed)
H&P  Patient Demographics:  Carmen [name], is a 32 y.o. female  MRN: [id number]   DOB - 04-Dec-1990  Admit Date - 03/21/2023  Outpatient Primary MD for the patient is Massie Maroon, FNP  Chief Complaint  Patient presents with   Sickle Cell Pain Crisis      HPI:   Carmen [name]  is a 32 y.o. female with a medical history significant for sickle cell disease, chronic pain syndrome, opiate dependence and tolerance, history of major depression, and anemia of chronic disease presents to the emergency department with pain mostly to right upper extremity and low back. Pain is consistent with previous pain crisis. Pain intensity has been increasing over the past 4 days without very much improvement from home medications. She has been attempting to manage at home without success. Patients's home medications include MS Contin and oxycodone. She attributes pain crisis to changes in weather. Patient rates pain intensity as 9/10, constant, and aching.  Patient denies headache, dizziness, fever, chills, chest pain, or shortness of breath. No urinary symptoms, nausea, vomiting, or diarrhea.   ER Course:  While in ER, vital signs stable.  Complete blood count shows hemoglobin 9.9, platelets, 491,000, and WBCs 7.9. Complete metabolic pain shows elevated bilirubin at 1.5, otherwise unremarkable.  Patient's pain persists despite IV dilaudid, IV fluids, and IV Toradol. Sickle cell team consulted. Will admit for further treatment and evaluation of sickle cell pain crisis.    Review of systems:  Review of Systems  Constitutional: Negative.   HENT: Negative.    Eyes: Negative.   Respiratory: Negative.    Cardiovascular: Negative.   Gastrointestinal: Negative.   Genitourinary: Negative.   Musculoskeletal:  Positive for back pain and joint pain.       Pain primarily to right upper extremity  Skin: Negative.   Neurological: Negative.   Psychiatric/Behavioral: Negative.      With Past History of the  following :   Past Medical History:  Diagnosis Date   Anxiety    Headache(784.0)    Heart murmur    Sickle cell crisis (HCC)    Syphilis 2015   Was diagnosed and received one injection of antibiotics   Thrombocytosis 11/22/2014     CBC Latest Ref Rng & Units 12/13/2018 12/11/2018 12/10/2018 WBC 4.0 - 10.5 K/uL 14.8(H) 13.2(H) 15.9(H) Hemoglobin 12.0 - 15.0 g/dL 8.2(L) 7.7(L) 8.1(L) Hematocrit 36.0 - 46.0 % 23.7(L) 23.2(L) 24.5(L) Platelets 150 - 400 K/uL 326 399 388        Past Surgical History:  Procedure Laterality Date   CESAREAN SECTION N/A 02/05/2019   Procedure: CESAREAN SECTION;  Surgeon: Hermina Staggers, MD;  Location: [facility] LD ORS;  Service: Obstetrics;  Laterality: N/A;   CHOLECYSTECTOMY N/A 11/30/2014   Procedure: LAPAROSCOPIC CHOLECYSTECTOMY SINGLE SITE WITH INTRAOPERATIVE CHOLANGIOGRAM;  Surgeon: Karie Soda, MD;  Location: [facility] ORS;  Service: General;  Laterality: N/A;   SPLENECTOMY       Social History:   Social History   Tobacco Use   Smoking status: Some Days    Types: Cigarettes   Smokeless tobacco: Never  Substance Use Topics   Alcohol use: No     Lives - At home   Family History :   Family History  Problem Relation Age of Onset   Hypertension Mother    Sickle cell anemia Sister    Kidney disease Sister        Lupus   Arthritis Sister    Sickle cell anemia Sister  Sickle cell trait Sister    Heart disease Maternal Aunt        CABG   Heart disease Maternal Uncle        CABG   Lupus Sister      Home Medications:   Prior to Admission medications   Medication Sig Start Date End Date Taking? Authorizing Provider  DULoxetine (CYMBALTA) 30 MG capsule Take 2 capsules (60 mg total) by mouth daily. Patient taking differently: Take 60 mg by mouth at bedtime. 11/18/22 05/17/23 Yes Massie Maroon, FNP  folic acid (FOLVITE) 1 MG tablet Take 1 tablet (1 mg total) by mouth daily. 03/15/19  Yes Mike Gip, FNP  hydroxyurea (HYDREA) 500 MG capsule Take 500 mg  by mouth daily. 12/30/22 03/30/23 Yes [provider]  morphine (MS CONTIN) 60 MG 12 hr tablet Take 1 tablet (60 mg total) by mouth every 12 (twelve) hours. 02/21/23  Yes Massie Maroon, FNP  Oxycodone HCl 10 MG TABS Take 1 tablet (10 mg total) by mouth every 6 (six) hours as needed (pain). 03/09/23  Yes Massie Maroon, FNP  Vitamin D, Ergocalciferol, (DRISDOL) 1.25 MG (50000 UNIT) CAPS capsule TAKE ONE CAPSULE BY MOUTH ONE TIME PER WEEK Patient taking differently: Take 50,000 Units by mouth every Saturday. 08/24/22  Yes Massie Maroon, FNP  betamethasone valerate ointment (VALISONE) 0.1 % APPLY TO AFFECTED AREA TWICE A DAY Patient not taking: Reported on 05/10/2022 04/07/22   Massie Maroon, FNP  cyclobenzaprine (FLEXERIL) 10 MG tablet Take 1 tablet (10 mg total) by mouth at bedtime. TAKE 1 TABLET BY MOUTH DAILY AS NEEDED FOR MUSCLE SPASMS. Patient not taking: Reported on 03/09/2023 11/18/22   Massie Maroon, FNP     Allergies:   Allergies  Allergen Reactions   Ketamine Hives, Nausea And Vomiting and Other (See Comments)    Makes the patient "feel funny" also     Physical Exam:   Vitals:   Vitals:   03/22/23 0603 03/22/23 0757  BP: 110/64   Pulse: 65   Resp: 14 17  Temp: 98.4 F (36.9 C)   SpO2: 98% 96%    Physical Exam: Constitutional: Patient appears well-developed and well-nourished. Not in obvious distress. HENT: Normocephalic, atraumatic, External right and left ear normal. Oropharynx is clear and moist.  Eyes: Conjunctivae and EOM are normal. PERRLA, no scleral icterus. Neck: Normal ROM. Neck supple. No JVD. No tracheal deviation. No thyromegaly. CVS: RRR, S1/S2 +, no murmurs, no gallops, no carotid bruit.  Pulmonary: Effort and breath sounds normal, no stridor, rhonchi, wheezes, rales.  Abdominal: Soft. BS +, no distension, tenderness, rebound or guarding.  Musculoskeletal: Normal range of motion. No edema and no tenderness.  Lymphadenopathy: No  lymphadenopathy noted, cervical, inguinal or axillary Neuro: Alert. Normal reflexes, muscle tone coordination. No cranial nerve deficit. Skin: Skin is warm and dry. No rash noted. Not diaphoretic. No erythema. No pallor. Psychiatric: Normal mood and affect. Behavior, judgment, thought content normal.   Data Review:   CBC Recent Labs  Lab 03/21/23 0555 03/22/23 0550  WBC 7.9 10.4  HGB 9.9* 8.9*  HCT 28.0* 25.0*  PLT 491* 356  MCV 82.8 82.2  MCH 29.3 29.3  MCHC 35.4 35.6  RDW 13.2 13.4  LYMPHSABS 2.9  --   MONOABS 1.0  --   EOSABS 0.1  --   BASOSABS 0.1  --    ------------------------------------------------------------------------------------------------------------------  Chemistries  Recent Labs  Lab 03/21/23 0555 03/22/23 0550  NA 137 133*  K  3.6 3.5  CL 108 106  CO2 22 20*  GLUCOSE 101* 98  BUN 14 13  CREATININE 0.70 0.60  CALCIUM 9.5 9.0  AST 25  --   ALT 24  --   ALKPHOS 60  --   BILITOT 1.5*  --    ------------------------------------------------------------------------------------------------------------------ estimated creatinine clearance is 106.6 mL/min (by C-G formula based on SCr of 0.6 mg/dL). ------------------------------------------------------------------------------------------------------------------ No results for input(s): "TSH", "T4TOTAL", "T3FREE", "THYROIDAB" in the last 72 hours.  Invalid input(s): "FREET3"  Coagulation profile No results for input(s): "INR", "PROTIME" in the last 168 hours. ------------------------------------------------------------------------------------------------------------------- No results for input(s): "DDIMER" in the last 72 hours. -------------------------------------------------------------------------------------------------------------------  Cardiac Enzymes No results for input(s): "CKMB", "TROPONINI", "MYOGLOBIN" in the last 168 hours.  Invalid input(s):  "CK" ------------------------------------------------------------------------------------------------------------------ No results found for: "BNP"  ---------------------------------------------------------------------------------------------------------------  Urinalysis    Component Value Date/Time   COLORURINE YELLOW 09/11/2022 1510   APPEARANCEUR CLEAR 09/11/2022 1510   LABSPEC 1.013 09/11/2022 1510   PHURINE 5.0 09/11/2022 1510   GLUCOSEU NEGATIVE 09/11/2022 1510   HGBUR NEGATIVE 09/11/2022 1510   BILIRUBINUR NEGATIVE 09/11/2022 1510   BILIRUBINUR negative 08/26/2021 1527   BILIRUBINUR Negative 08/10/2019 1534   KETONESUR NEGATIVE 09/11/2022 1510   PROTEINUR NEGATIVE 09/11/2022 1510   UROBILINOGEN 1.0 08/26/2021 1527   UROBILINOGEN 1.0 01/12/2018 0943   NITRITE NEGATIVE 09/11/2022 1510   LEUKOCYTESUR NEGATIVE 09/11/2022 1510    ----------------------------------------------------------------------------------------------------------------   Imaging Results:    No results found.   Assessment & Plan:  Principal Problem:   Sickle cell pain crisis (HCC) Active Problems:   Anemia of chronic disease   Chronic prescription opiate use   Sickle cell disease with a pain crisis:  Initiate IV dilaudid PCA Toradol 15 mg IV every 6 hours 0.45% saline at 100 ml/hr MS Contin 60 mg every 12 hours Hold Oxycodone, restart as pain intensity improves Monitor vital signs closely, reevaluate pain scale regularly, and opiate dependence and tolerance.  Patient will be reevaluated for pain in the context of function and relationship to baseline as care progresses.   Chronic pain syndrome;  Continue home medications  Anemia of chronic disease:  Hemoglobin is stable and consistent with baseline. There is no clinical indication for blood transfusion at this time.  Continue folic acid and hydroxyurea.  Follow labs in am   Major depression : Continue Cymbalta. No suicidal or homicidal  intentions on today. Follow closely   DVT Prophylaxis: Subcut Lovenox and sCDs  AM Labs Ordered, also please review Full Orders  Family Communication: Admission, patient's condition and plan of care including tests being ordered have been discussed with the patient who indicate understanding and agree with the plan and Code Status.  Code Status: Full Code  Consults called: None    Admission status: Inpatient    Time spent in minutes : 40 minutes  Nolon Nations  APRN, MSN, FNP-C [facility] [address] [place], [place] [address] [phone]  03/22/2023 at 9:37 AM

## 2023-03-23 DIAGNOSIS — D5700 Hb-SS disease with crisis, unspecified: Secondary | ICD-10-CM | POA: Diagnosis not present

## 2023-03-23 NOTE — Progress Notes (Signed)
Subjective: Carmen [name] is a 32 year old female with a medical history significant for sickle cell disease, chronic syndrome, opiate dependence and tolerance, bipolar depression, and history of anemia of chronic disease was admitted for sickle cell pain crisis.   Patient states she feels slightly better today.  Her pain is now about 6/10, still in her upper extremities.  She also has some pain in her lower back.  She however denies any fever, cough, chest pain, shortness of breath, nausea, vomiting or diarrhea.  No urinary symptoms.  Objective:  Vital signs in last 24 hours:  Vitals:   03/23/23 0727 03/23/23 0820 03/23/23 1142 03/23/23 1416  BP:  117/86 122/70 119/74  Pulse:  85 77 71  Resp: 18     Temp:  98.2 F (36.8 C) 98.2 F (36.8 C) 98.4 F (36.9 C)  TempSrc:  Oral Oral Oral  SpO2:  98% 100% 96%  Weight:      Height:        Intake/Output from previous day:   Intake/Output Summary (Last 24 hours) at 03/23/2023 1806 Last data filed at 03/23/2023 0600 Gross per 24 hour  Intake 740.57 ml  Output --  Net 740.57 ml     Physical Exam: General: Alert, awake, oriented x3, in no acute distress.  HEENT: [place]/AT [place]ERL, EOMI Neck: Trachea midline,  no masses, no thyromegal,y no JVD, no carotid bruit OROPHARYNX:  Moist, No exudate/ erythema/lesions.  Heart: Regular rate and rhythm, without murmurs, rubs, gallops, PMI non-displaced, no heaves or thrills on palpation.  Lungs: Clear to auscultation, no wheezing or rhonchi noted. No increased vocal fremitus resonant to percussion  Abdomen: Soft, nontender, nondistended, positive bowel sounds, no masses no hepatosplenomegaly noted..  Neuro: No focal neurological deficits noted cranial nerves II through XII grossly intact. DTRs 2+ bilaterally upper and lower extremities. Strength 5 out of 5 in bilateral upper and lower extremities. Musculoskeletal: No warm swelling or erythema around joints, no spinal tenderness noted. Psychiatric:  Patient alert and oriented x3, good insight and cognition, good recent to remote recall. Lymph node survey: No cervical axillary or inguinal lymphadenopathy noted.  Lab Results:  Basic Metabolic Panel:    Component Value Date/Time   NA 133 (L) 03/22/2023 0550   NA 142 08/24/2022 1213   K 3.5 03/22/2023 0550   CL 106 03/22/2023 0550   CO2 20 (L) 03/22/2023 0550   BUN 13 03/22/2023 0550   BUN 10 08/24/2022 1213   CREATININE 0.60 03/22/2023 0550   GLUCOSE 98 03/22/2023 0550   CALCIUM 9.0 03/22/2023 0550   CBC:    Component Value Date/Time   WBC 10.4 03/22/2023 0550   HGB 8.9 (L) 03/22/2023 0550   HGB 9.7 (L) 08/24/2022 1213   HCT 25.0 (L) 03/22/2023 0550   HCT 30.1 (L) 08/24/2022 1213   PLT 356 03/22/2023 0550   PLT 477 (H) 08/24/2022 1213   MCV 82.2 03/22/2023 0550   MCV 88 08/24/2022 1213   NEUTROABS 3.8 03/21/2023 0555   NEUTROABS 3.3 08/24/2022 1213   LYMPHSABS 2.9 03/21/2023 0555   LYMPHSABS 2.8 08/24/2022 1213   MONOABS 1.0 03/21/2023 0555   EOSABS 0.1 03/21/2023 0555   EOSABS 0.2 08/24/2022 1213   BASOSABS 0.1 03/21/2023 0555   BASOSABS 0.1 08/24/2022 1213    No results found for this or any previous visit (from the past 240 hour(s)).  Studies/Results: No results found.  Medications: Scheduled Meds:  DULoxetine  60 mg Oral Daily   enoxaparin (LOVENOX) injection  40 mg Subcutaneous Daily   folic acid  1 mg Oral Daily   HYDROmorphone   Intravenous Q4H   hydroxyurea  500 mg Oral Daily   ketorolac  15 mg Intravenous Q6H   morphine  60 mg Oral Q12H   senna-docusate  1 tablet Oral BID   Continuous Infusions:  sodium chloride 10 mL/hr at 03/22/23 1116   PRN Meds:.diphenhydrAMINE, naloxone **AND** sodium chloride flush, ondansetron (ZOFRAN) IV, polyethylene glycol  Consultants: None  Procedures: None  Antibiotics: None  Assessment/Plan: Principal Problem:   Sickle cell pain crisis (HCC) Active Problems:   Sickle cell anemia with pain (HCC)    Anemia of chronic disease   Chronic prescription opiate use  Hb Sickle Cell Disease with Pain crisis: IVF at [facility], continue weight based Dilaudid PCA at current dose setting, continue IV Toradol 15 mg Q 6 H for total of 5 days, continue oral home pain medications as ordered. Continue oxycodone. Monitor vitals very closely, Re-evaluate pain scale regularly, 2 L of Oxygen by [place]. Anemia of Chronic Disease: Hemoglobin continues to be stable at baseline today. There is no clinical indication for blood transfusion at this time.  Will monitor very closely and transfuse as appropriate. Chronic pain Syndrome: Continue home medications as ordered. Major depressive disorder: Clinically stable. Patient denies any suicidal ideations or thoughts. Will continue Cymbalta and follow very closely.  Code Status: Full Code Family Communication: N/A Disposition Plan: Not yet ready for discharge  Molina Hollenback  If 7PM-7AM, please contact night-coverage.  03/23/2023, 6:06 PM  LOS: 2 days Patient ID: Carmen [name], female   DOB: 1991-08-28, 32 y.o.   MRN: [id number]

## 2023-03-24 ENCOUNTER — Other Ambulatory Visit: Payer: Self-pay

## 2023-03-24 DIAGNOSIS — D571 Sickle-cell disease without crisis: Secondary | ICD-10-CM

## 2023-03-24 DIAGNOSIS — D5700 Hb-SS disease with crisis, unspecified: Secondary | ICD-10-CM | POA: Diagnosis not present

## 2023-03-24 DIAGNOSIS — Z79891 Long term (current) use of opiate analgesic: Secondary | ICD-10-CM

## 2023-03-24 MED ORDER — OXYCODONE HCL 10 MG PO TABS
10.0000 mg | ORAL_TABLET | Freq: Four times a day (QID) | ORAL | 0 refills | Status: DC | PRN
Start: 2023-03-24 — End: 2023-04-04

## 2023-03-24 MED ORDER — MORPHINE SULFATE ER 60 MG PO TBCR
60.0000 mg | EXTENDED_RELEASE_TABLET | Freq: Two times a day (BID) | ORAL | 0 refills | Status: DC
Start: 2023-03-24 — End: 2023-04-21

## 2023-03-24 NOTE — Plan of Care (Addendum)
Patient is alert and oriented X4, came in for sickle cell crisis, pain managed with Dilaudid PCA pump. Patient requesting [place] to set up transportation. PIV removed and PCA pump disconnected, awaiting transportation method to be set up by patient, pt eligible for d/c lounge.  Problem: Education: Goal: Knowledge of vaso-occlusive preventative measures will improve Outcome: Completed/Met Goal: Awareness of infection prevention will improve Outcome: Completed/Met Goal: Awareness of signs and symptoms of anemia will improve Outcome: Completed/Met Goal: Long-term complications will improve Outcome: Completed/Met   Problem: Self-Care: Goal: Ability to incorporate actions that prevent/reduce pain crisis will improve Outcome: Completed/Met   Problem: Bowel/Gastric: Goal: Gut motility will be maintained Outcome: Completed/Met   Problem: Tissue Perfusion: Goal: Complications related to inadequate tissue perfusion will be avoided or minimized Outcome: Completed/Met   Problem: Respiratory: Goal: Pulmonary complications will be avoided or minimized Outcome: Completed/Met Goal: Acute Chest Syndrome will be identified early to prevent complications Outcome: Completed/Met   Problem: Fluid Volume: Goal: Ability to maintain a balanced intake and output will improve Outcome: Completed/Met   Problem: Sensory: Goal: Pain level will decrease with appropriate interventions Outcome: Completed/Met   Problem: Health Behavior: Goal: Postive changes in compliance with treatment and prescription regimens will improve Outcome: Completed/Met   Problem: Education: Goal: Knowledge of General Education information will improve Description: Including pain rating scale, medication(s)/side effects and non-pharmacologic comfort measures Outcome: Completed/Met   Problem: Health Behavior/Discharge Planning: Goal: Ability to manage health-related needs will improve Outcome: Completed/Met   Problem: Clinical  Measurements: Goal: Ability to maintain clinical measurements within normal limits will improve Outcome: Completed/Met Goal: Will remain free from infection Outcome: Completed/Met Goal: Diagnostic test results will improve Outcome: Completed/Met Goal: Respiratory complications will improve Outcome: Completed/Met Goal: Cardiovascular complication will be avoided Outcome: Completed/Met   Problem: Activity: Goal: Risk for activity intolerance will decrease Outcome: Completed/Met   Problem: Nutrition: Goal: Adequate nutrition will be maintained Outcome: Completed/Met   Problem: Coping: Goal: Level of anxiety will decrease Outcome: Completed/Met   Problem: Elimination: Goal: Will not experience complications related to bowel motility Outcome: Completed/Met Goal: Will not experience complications related to urinary retention Outcome: Completed/Met   Problem: Pain Managment: Goal: General experience of comfort will improve Outcome: Completed/Met   Problem: Safety: Goal: Ability to remain free from injury will improve Outcome: Completed/Met   Problem: Skin Integrity: Goal: Risk for impaired skin integrity will decrease Outcome: Completed/Met

## 2023-03-24 NOTE — Telephone Encounter (Signed)
Please fill in [place] absence. KH

## 2023-03-24 NOTE — Discharge Summary (Signed)
Physician Discharge Summary  [name]:[id number] DOB: 11-25-1990 DOA: 03/21/2023  PCP: Massie Maroon, FNP  Admit date: 03/21/2023  Discharge date: 03/24/2023  Admitted From:Home  Disposition:  Home  Recommendations for Outpatient Follow-up:  Follow up with PCP in 1-2 weeks Continue on home pain management as prior and call for refills as needed  Home Health: None  Equipment/Devices: None  Discharge Condition:Stable  CODE STATUS: Full  Diet recommendation: Regular  Brief/Interim Summary: Carmen [name] is a 32 year old female with a medical history significant for sickle cell disease, chronic syndrome, opiate dependence and tolerance, bipolar depression, and history of anemia of chronic disease was admitted for sickle cell pain crisis.  She was started on Dilaudid PCA for pain management as well as IV Toradol and remained hospitalized for 3 days.  She is no longer symptomatic at this point and will follow-up with the sickle cell clinic as previously arranged.  Discharge Diagnoses:  Principal Problem:   Sickle cell pain crisis (HCC) Active Problems:   Sickle cell anemia with pain (HCC)   Anemia of chronic disease   Chronic prescription opiate use  Principal discharge diagnosis: Sickle cell disease with pain crisis.  Discharge Instructions  Discharge Instructions     Diet - low sodium heart healthy   Complete by: As directed    Increase activity slowly   Complete by: As directed       Allergies as of 03/24/2023       Reactions   Ketamine Hives, Nausea And Vomiting, Other (See Comments)   Makes the patient "feel funny" also        Medication List     TAKE these medications    betamethasone valerate ointment 0.1 % Commonly known as: VALISONE APPLY TO AFFECTED AREA TWICE A DAY   cyclobenzaprine 10 MG tablet Commonly known as: FLEXERIL Take 1 tablet (10 mg total) by mouth at bedtime. TAKE 1 TABLET BY MOUTH DAILY AS NEEDED FOR MUSCLE SPASMS.    DULoxetine 30 MG capsule Commonly known as: CYMBALTA Take 2 capsules (60 mg total) by mouth daily. What changed: when to take this   folic acid 1 MG tablet Commonly known as: FOLVITE Take 1 tablet (1 mg total) by mouth daily.   hydroxyurea 500 MG capsule Commonly known as: HYDREA Take 500 mg by mouth daily.   morphine 60 MG 12 hr tablet Commonly known as: MS CONTIN Take 1 tablet (60 mg total) by mouth every 12 (twelve) hours.   Oxycodone HCl 10 MG Tabs Take 1 tablet (10 mg total) by mouth every 6 (six) hours as needed (pain).   Vitamin D (Ergocalciferol) 1.25 MG (50000 UNIT) Caps capsule Commonly known as: DRISDOL TAKE ONE CAPSULE BY MOUTH ONE TIME PER WEEK What changed:  how much to take how to take this when to take this additional instructions        Follow-up Information     Massie Maroon, FNP. Go to.   Specialty: Family Medicine Contact information: [address] [place] [address] [phone]                Allergies  Allergen Reactions   Ketamine Hives, Nausea And Vomiting and Other (See Comments)    Makes the patient "feel funny" also    Consultations: None   Procedures/Studies: No results found.   Discharge Exam: Vitals:   03/24/23 0445 03/24/23 0745  BP:  (!) 112/54  Pulse:  82  Resp: 14 15  Temp:  98.5 F (36.9 C)  SpO2: 98% 95%   Vitals:   03/24/23 0015 03/24/23 0249 03/24/23 0445 03/24/23 0745  BP:  128/75  (!) 112/54  Pulse:  82  82  Resp: 14 18 14 15   Temp:  98.6 F (37 C)  98.5 F (36.9 C)  TempSrc:  Oral  Oral  SpO2: 97% 100% 98% 95%  Weight:      Height:        General: Pt is alert, awake, not in acute distress Cardiovascular: RRR, S1/S2 +, no rubs, no gallops Respiratory: CTA bilaterally, no wheezing, no rhonchi Abdominal: Soft, NT, ND, bowel sounds + Extremities: no edema, no cyanosis    The results of significant diagnostics from this hospitalization (including imaging,  microbiology, ancillary and laboratory) are listed below for reference.     Microbiology: No results found for this or any previous visit (from the past 240 hour(s)).   Labs: BNP (last 3 results) No results for input(s): "BNP" in the last 8760 hours. Basic Metabolic Panel: Recent Labs  Lab 03/21/23 0555 03/22/23 0550  NA 137 133*  K 3.6 3.5  CL 108 106  CO2 22 20*  GLUCOSE 101* 98  BUN 14 13  CREATININE 0.70 0.60  CALCIUM 9.5 9.0   Liver Function Tests: Recent Labs  Lab 03/21/23 0555  AST 25  ALT 24  ALKPHOS 60  BILITOT 1.5*  PROT 8.5*  ALBUMIN 4.3   No results for input(s): "LIPASE", "AMYLASE" in the last 168 hours. No results for input(s): "AMMONIA" in the last 168 hours. CBC: Recent Labs  Lab 03/21/23 0555 03/22/23 0550  WBC 7.9 10.4  NEUTROABS 3.8  --   HGB 9.9* 8.9*  HCT 28.0* 25.0*  MCV 82.8 82.2  PLT 491* 356   Cardiac Enzymes: No results for input(s): "CKTOTAL", "CKMB", "CKMBINDEX", "TROPONINI" in the last 168 hours. BNP: Invalid input(s): "POCBNP" CBG: No results for input(s): "GLUCAP" in the last 168 hours. D-Dimer No results for input(s): "DDIMER" in the last 72 hours. Hgb A1c No results for input(s): "HGBA1C" in the last 72 hours. Lipid Profile No results for input(s): "CHOL", "HDL", "LDLCALC", "TRIG", "CHOLHDL", "LDLDIRECT" in the last 72 hours. Thyroid function studies No results for input(s): "TSH", "T4TOTAL", "T3FREE", "THYROIDAB" in the last 72 hours.  Invalid input(s): "FREET3" Anemia work up No results for input(s): "VITAMINB12", "FOLATE", "FERRITIN", "TIBC", "IRON", "RETICCTPCT" in the last 72 hours. Urinalysis    Component Value Date/Time   COLORURINE YELLOW 09/11/2022 1510   APPEARANCEUR CLEAR 09/11/2022 1510   LABSPEC 1.013 09/11/2022 1510   PHURINE 5.0 09/11/2022 1510   GLUCOSEU NEGATIVE 09/11/2022 1510   HGBUR NEGATIVE 09/11/2022 1510   BILIRUBINUR NEGATIVE 09/11/2022 1510   BILIRUBINUR negative 08/26/2021 1527    BILIRUBINUR Negative 08/10/2019 1534   KETONESUR NEGATIVE 09/11/2022 1510   PROTEINUR NEGATIVE 09/11/2022 1510   UROBILINOGEN 1.0 08/26/2021 1527   UROBILINOGEN 1.0 01/12/2018 0943   NITRITE NEGATIVE 09/11/2022 1510   LEUKOCYTESUR NEGATIVE 09/11/2022 1510   Sepsis Labs Recent Labs  Lab 03/21/23 0555 03/22/23 0550  WBC 7.9 10.4   Microbiology No results found for this or any previous visit (from the past 240 hour(s)).   Time coordinating discharge: 35 minutes  SIGNED:   Erick Blinks, DO [facility] 03/24/2023, 8:25 AM  If 7PM-7AM, please contact night-coverage [email or web address]

## 2023-03-24 NOTE — TOC Progression Note (Signed)
Transition of Care [facility]) - Progression Note    Patient Details  Name: Carmen [name] MRN: [id number] Date of Birth: Mar 19, 1991  Transition of Care [facility]) CM/SW Contact  Beckie Busing, RN Phone [phone]  03/24/2023, 8:39 AM  Clinical Narrative:     Transition of Care [facility]) Screening Note   Patient Details  Name: Carmen [name] Date of Birth: 01-11-1991   Transition of Care [facility]) CM/SW Contact:    Beckie Busing, RN Phone Number: 03/24/2023, 8:39 AM    Transition of Care Department [facility]) has reviewed patient and no TOC needs have been identified at this time. We will continue to monitor patient advancement through interdisciplinary progression rounds. If new patient transition needs arise, please place a TOC consult.          Expected Discharge Plan and Services         Expected Discharge Date: 03/24/23                                     Social Determinants of Health (SDOH) Interventions SDOH Screenings   Food Insecurity: No Food Insecurity (03/21/2023)  Housing: Low Risk  (03/21/2023)  Transportation Needs: Unmet Transportation Needs (03/21/2023)  Utilities: Not At Risk (03/21/2023)  Alcohol Screen: Low Risk  (03/09/2023)  Depression (PHQ2-9): Low Risk  (03/09/2023)  Financial Resource Strain: Low Risk  (03/09/2023)  Physical Activity: Insufficiently Active (03/09/2023)  Social Connections: Moderately Isolated (03/09/2023)  Stress: No Stress Concern Present (03/09/2023)  Tobacco Use: High Risk (03/21/2023)    Readmission Risk Interventions    09/13/2022    2:06 PM 09/13/2022    9:03 AM 08/16/2022    2:56 PM  Readmission Risk Prevention Plan  Transportation Screening Complete Complete Complete  Medication Review [facility])  Complete Complete  PCP or Specialist appointment within 3-5 days of discharge  Complete Complete  HRI or Home Care Consult  Complete Complete  SW Recovery Care/Counseling Consult  Complete Complete  Palliative Care  Screening  Not Applicable Not Applicable  Skilled Nursing Facility  Not Applicable Not Applicable

## 2023-03-24 NOTE — Telephone Encounter (Signed)
From: [name] To: Office of [name], [place] Sent: 03/24/2023 9:26 AM EDT Subject: Medication Renewal Request  Refills have been requested for the following medications:   morphine (MS CONTIN) 60 MG 12 hr tablet [Lachina Hollis]  Pref[facility] pharmacy: [id number] [place], [place] - [address] AT [address] Delivery method: [employer]

## 2023-03-24 NOTE — Telephone Encounter (Signed)
Please advise in [place] absence. KH 

## 2023-03-25 ENCOUNTER — Telehealth: Payer: Self-pay

## 2023-03-25 ENCOUNTER — Other Ambulatory Visit: Payer: Self-pay

## 2023-03-25 NOTE — Telephone Encounter (Signed)
Prior authorization for Oxycodone has been submitted to [facility] via [facility] Tracks Confirmation # [id number] W

## 2023-03-25 NOTE — Telephone Encounter (Signed)
Approved until 06/23/2023

## 2023-03-29 ENCOUNTER — Telehealth: Payer: Self-pay

## 2023-03-29 NOTE — Transitions of Care (Post Inpatient/ED Visit) (Signed)
   03/29/2023  Name: Carmen [name] MRN: [id number] DOB: 06/04/1991  Today's TOC FU Call Status: Today's TOC FU Call Status:: Unsuccessul Call (1st Attempt) Unsuccessful Call (1st Attempt) Date: 03/29/23  Attempted to reach the patient regarding the most recent Inpatient/ED visit.  Follow Up Plan: Additional outreach attempts will be made to reach the patient to complete the Transitions of Care (Post Inpatient/ED visit) call.   Signature Agnes Lawrence, CMA (AAMA)  [facility] [phone]

## 2023-03-31 NOTE — Transitions of Care (Post Inpatient/ED Visit) (Signed)
   03/31/2023  Name: Carmen [name] MRN: [id number] DOB: [birth year]  Today's TOC FU Call Status: Today's TOC FU Call Status:: Unsuccessful Call (2nd Attempt) Unsuccessful Call (1st Attempt) Date: 03/29/23 Unsuccessful Call (2nd Attempt) Date: 03/31/23  Attempted to reach the patient regarding the most recent Inpatient/ED visit.  Follow Up Plan: Additional outreach attempts will be made to reach the patient to complete the Transitions of Care (Post Inpatient/ED visit) call.   Signature  Agnes Lawrence, CMA (AAMA)  [facility] [phone]

## 2023-04-04 ENCOUNTER — Other Ambulatory Visit: Payer: Self-pay | Admitting: Family Medicine

## 2023-04-04 ENCOUNTER — Telehealth: Payer: Self-pay

## 2023-04-04 DIAGNOSIS — Z79891 Long term (current) use of opiate analgesic: Secondary | ICD-10-CM

## 2023-04-04 DIAGNOSIS — D571 Sickle-cell disease without crisis: Secondary | ICD-10-CM

## 2023-04-04 MED ORDER — OXYCODONE HCL 10 MG PO TABS
10.00 mg | ORAL_TABLET | Freq: Four times a day (QID) | ORAL | 0 refills | Status: DC | PRN
Start: 2023-04-04 — End: 2023-05-06

## 2023-04-04 NOTE — Transitions of Care (Post Inpatient/ED Visit) (Signed)
   04/04/2023  Name: Carmen [name] MRN: [id number] DOB: 1991-05-07  Today's TOC FU Call Status: Today's TOC FU Call Status:: Successful TOC FU Call Competed Unsuccessful Call (1st Attempt) Date: 03/29/23 Unsuccessful Call (2nd Attempt) Date: 03/31/23 [facility] FU Call Complete Date: 04/04/23  Transition Care Management Follow-up Telephone Call Date of Discharge: 03/24/23 Discharge Facility: [facility]) Type of Discharge: Inpatient Admission Primary Inpatient Discharge Diagnosis:: Sickle cell pain crisis How have you been since you were released from the hospital?: Better Any questions or concerns?: No  Items Reviewed: Did you receive and understand the discharge instructions provided?: Yes Medications obtained,verified, and reconciled?: Yes (Medications Reviewed) Any new allergies since your discharge?: No Dietary orders reviewed?: NA Do you have support at home?: Yes  Medications Reviewed Today: Medications Reviewed Today     Reviewed by Leigh Aurora, CMA (Certified Medical Assistant) on 04/04/23 at 1628  Med List Status: <None>   Medication Order Taking? Sig Documenting Provider Last Dose Status Informant  betamethasone valerate ointment (VALISONE) 0.1 % [id number] No APPLY TO AFFECTED AREA TWICE A DAY  Patient not taking: Reported on 05/10/2022   Massie Maroon, FNP Not Taking Active Self, Pharmacy Records  cyclobenzaprine (FLEXERIL) 10 MG tablet [id number] No Take 1 tablet (10 mg total) by mouth at bedtime. TAKE 1 TABLET BY MOUTH DAILY AS NEEDED FOR MUSCLE SPASMS.  Patient not taking: Reported on 03/09/2023   Massie Maroon, FNP Not Taking Active Self, Pharmacy Records  DULoxetine (CYMBALTA) 30 MG capsule [id number] No Take 2 capsules (60 mg total) by mouth daily.  Patient taking differently: Take 60 mg by mouth at bedtime.   Massie Maroon, FNP 03/20/2023 Active Self, Pharmacy Records  folic acid (FOLVITE) 1 MG tablet [id number] No Take 1 tablet (1 mg total) by  mouth daily. Mike Gip, FNP Past Week Active Self, Pharmacy Records  morphine (MS CONTIN) 60 MG 12 hr tablet [id number]  Take 1 tablet (60 mg total) by mouth every 12 (twelve) hours. Ivonne Andrew, NP  Active   Oxycodone HCl 10 MG TABS [id number]  Take 1 tablet (10 mg total) by mouth every 6 (six) hours as needed (pain). Massie Maroon, FNP  Active   Vitamin D, Ergocalciferol, (DRISDOL) 1.25 MG (50000 UNIT) CAPS capsule [id number] No TAKE ONE CAPSULE BY MOUTH ONE TIME PER WEEK  Patient taking differently: Take 50,000 Units by mouth every Saturday.   Massie Maroon, FNP 03/18/2023 Active Self, Pharmacy Records           Med Note Wynonia Musty   [facility] Sep 11, 2022  9:38 PM) Fridays             Home Care and Equipment/Supplies: Were Home Health Services Ordered?: NA Any new equipment or medical supplies ordered?: NA  Functional Questionnaire: Do you need assistance with bathing/showering or dressing?: No Do you need assistance with meal preparation?: No Do you need assistance with eating?: No Do you have difficulty maintaining continence: No Do you need assistance with getting out of bed/getting out of a chair/moving?: No Do you have difficulty managing or taking your medications?: No  Follow up appointments reviewed: PCP Follow-up appointment confirmed?: No (declined) MD Provider Line [phone] Given: Yes Specialist Hospital Follow-up appointment confirmed?: NA Do you need transportation to your follow-up appointment?: No Do you understand care options if your condition(s) worsen?: Yes-patient verbalized understanding    SIGNATURE Agnes Lawrence, CMA (AAMA)  [facility] [phone]

## 2023-04-04 NOTE — Progress Notes (Signed)
Reviewed PDMP substance reporting system prior to prescribing opiate medications. No inconsistencies noted.   Meds ordered this encounter  Medications  . Oxycodone HCl 10 MG TABS    Sig: Take 1 tablet (10 mg total) by mouth every 6 (six) hours as needed (pain).    Dispense:  60 tablet    Refill:  0    Order Specific Question:   Supervising Provider    Answer:   [name], [name] [[id number]]     Ayce Pietrzyk Moore Gerron Guidotti  APRN, MSN, FNP-C [facility] [address]  [place], [place] [address] [phone]  

## 2023-04-04 NOTE — Telephone Encounter (Signed)
Patient is in lock in program, pharmacy will not fill Oxycodone 10mg  that was sent in by [place] she is asking for [place] to please send in so she can get her meds.

## 2023-04-18 ENCOUNTER — Telehealth: Payer: Self-pay

## 2023-04-18 ENCOUNTER — Other Ambulatory Visit: Payer: Self-pay

## 2023-04-18 NOTE — Telephone Encounter (Signed)
Oxycodone prior authorization submitted to [facility] Tracks today. Confirmation # [id number] W

## 2023-04-18 NOTE — Telephone Encounter (Signed)
Approved until 07/17/2023. [facility] and [facility], MyChart message sent to patient today.

## 2023-04-21 ENCOUNTER — Encounter (HOSPITAL_COMMUNITY): Payer: Self-pay | Admitting: Family Medicine

## 2023-04-21 ENCOUNTER — Emergency Department (HOSPITAL_COMMUNITY)
Admission: EM | Admit: 2023-04-21 | Discharge: 2023-04-21 | Disposition: A | Discharge status: Home / Self Care | Payer: Medicaid Other | Source: Home / Self Care | Attending: Emergency Medicine | Admitting: Emergency Medicine

## 2023-04-21 ENCOUNTER — Other Ambulatory Visit: Payer: Self-pay | Admitting: Family Medicine

## 2023-04-21 ENCOUNTER — Inpatient Hospital Stay (HOSPITAL_COMMUNITY)
Admission: AD | Admit: 2023-04-21 | Discharge: 2023-04-23 | DRG: 812 | Disposition: A | Discharge status: Home / Self Care | Payer: Medicaid Other | Source: Ambulatory Visit | Attending: Internal Medicine | Admitting: Internal Medicine

## 2023-04-21 ENCOUNTER — Other Ambulatory Visit: Payer: Self-pay

## 2023-04-21 DIAGNOSIS — Z6832 Body mass index (BMI) 32.0-32.9, adult: Secondary | ICD-10-CM

## 2023-04-21 DIAGNOSIS — D638 Anemia in other chronic diseases classified elsewhere: Secondary | ICD-10-CM | POA: Diagnosis present

## 2023-04-21 DIAGNOSIS — Z9081 Acquired absence of spleen: Secondary | ICD-10-CM | POA: Diagnosis not present

## 2023-04-21 DIAGNOSIS — D571 Sickle-cell disease without crisis: Secondary | ICD-10-CM

## 2023-04-21 DIAGNOSIS — Z79899 Other long term (current) drug therapy: Secondary | ICD-10-CM

## 2023-04-21 DIAGNOSIS — E876 Hypokalemia: Secondary | ICD-10-CM | POA: Diagnosis present

## 2023-04-21 DIAGNOSIS — Z8269 Family history of other diseases of the musculoskeletal system and connective tissue: Secondary | ICD-10-CM | POA: Diagnosis not present

## 2023-04-21 DIAGNOSIS — E669 Obesity, unspecified: Secondary | ICD-10-CM | POA: Diagnosis present

## 2023-04-21 DIAGNOSIS — Z8249 Family history of ischemic heart disease and other diseases of the circulatory system: Secondary | ICD-10-CM

## 2023-04-21 DIAGNOSIS — F322 Major depressive disorder, single episode, severe without psychotic features: Secondary | ICD-10-CM | POA: Diagnosis present

## 2023-04-21 DIAGNOSIS — F17210 Nicotine dependence, cigarettes, uncomplicated: Secondary | ICD-10-CM | POA: Diagnosis present

## 2023-04-21 DIAGNOSIS — F331 Major depressive disorder, recurrent, moderate: Secondary | ICD-10-CM | POA: Diagnosis present

## 2023-04-21 DIAGNOSIS — Z841 Family history of disorders of kidney and ureter: Secondary | ICD-10-CM | POA: Diagnosis not present

## 2023-04-21 DIAGNOSIS — Z832 Family history of diseases of the blood and blood-forming organs and certain disorders involving the immune mechanism: Secondary | ICD-10-CM | POA: Diagnosis not present

## 2023-04-21 DIAGNOSIS — D5700 Hb-SS disease with crisis, unspecified: Secondary | ICD-10-CM

## 2023-04-21 DIAGNOSIS — Z5982 Transportation insecurity: Secondary | ICD-10-CM | POA: Diagnosis not present

## 2023-04-21 DIAGNOSIS — G894 Chronic pain syndrome: Secondary | ICD-10-CM | POA: Diagnosis present

## 2023-04-21 DIAGNOSIS — Z8261 Family history of arthritis: Secondary | ICD-10-CM | POA: Diagnosis not present

## 2023-04-21 DIAGNOSIS — Z79891 Long term (current) use of opiate analgesic: Secondary | ICD-10-CM

## 2023-04-21 DIAGNOSIS — G8929 Other chronic pain: Secondary | ICD-10-CM | POA: Diagnosis present

## 2023-04-21 LAB — CBC WITH DIFFERENTIAL/PLATELET
Abs Immature Granulocytes: 0.03 10*3/uL (ref 0.00–0.07)
Basophils Absolute: 0 10*3/uL (ref 0.0–0.1)
Basophils Relative: 0 %
Eosinophils Absolute: 0 10*3/uL (ref 0.0–0.5)
Eosinophils Relative: 0 %
HCT: 29.1 % — ABNORMAL LOW (ref 36.0–46.0)
Hemoglobin: 10.3 g/dL — ABNORMAL LOW (ref 12.0–15.0)
Immature Granulocytes: 0 %
Lymphocytes Relative: 30 %
Lymphs Abs: 2.1 10*3/uL (ref 0.7–4.0)
MCH: 29.5 pg (ref 26.0–34.0)
MCHC: 35.4 g/dL (ref 30.0–36.0)
MCV: 83.4 fL (ref 80.0–100.0)
Monocytes Absolute: 0.9 10*3/uL (ref 0.1–1.0)
Monocytes Relative: 12 %
Neutro Abs: 4 10*3/uL (ref 1.7–7.7)
Neutrophils Relative %: 58 %
Platelets: 462 10*3/uL — ABNORMAL HIGH (ref 150–400)
RBC: 3.49 MIL/uL — ABNORMAL LOW (ref 3.87–5.11)
RDW: 13.8 % (ref 11.5–15.5)
WBC: 7 10*3/uL (ref 4.0–10.5)
nRBC: 0.3 % — ABNORMAL HIGH (ref 0.0–0.2)

## 2023-04-21 LAB — RETICULOCYTES
Immature Retic Fract: 29.9 % — ABNORMAL HIGH (ref 2.3–15.9)
RBC.: 3.44 MIL/uL — ABNORMAL LOW (ref 3.87–5.11)
Retic Count, Absolute: 116.3 10*3/uL (ref 19.0–186.0)
Retic Ct Pct: 3.4 % — ABNORMAL HIGH (ref 0.4–3.1)

## 2023-04-21 LAB — COMPREHENSIVE METABOLIC PANEL
ALT: 18 U/L (ref 0–44)
AST: 20 U/L (ref 15–41)
Albumin: 4.5 g/dL (ref 3.5–5.0)
Alkaline Phosphatase: 62 U/L (ref 38–126)
Anion gap: 7 (ref 5–15)
BUN: 8 mg/dL (ref 6–20)
CO2: 22 mmol/L (ref 22–32)
Calcium: 9.5 mg/dL (ref 8.9–10.3)
Chloride: 110 mmol/L (ref 98–111)
Creatinine, Ser: 0.71 mg/dL (ref 0.44–1.00)
GFR, Estimated: >60 mL/min (ref >60)
Glucose, Bld: 99 mg/dL (ref 70–99)
Potassium: 3.1 mmol/L — ABNORMAL LOW (ref 3.5–5.1)
Sodium: 139 mmol/L (ref 135–145)
Total Bilirubin: 1.7 mg/dL — ABNORMAL HIGH (ref 0.3–1.2)
Total Protein: 8.3 g/dL — ABNORMAL HIGH (ref 6.5–8.1)

## 2023-04-21 LAB — I-STAT BETA HCG BLOOD, ED (MC, WL, AP ONLY): I-stat hCG, quantitative: <5 m[IU]/mL (ref <5)

## 2023-04-21 MED ORDER — HYDROMORPHONE HCL 2 MG/ML IJ SOLN
2.0000 mg | INTRAMUSCULAR | Status: AC
  Administered 2023-04-21: 2 mg via INTRAVENOUS
  Filled 2023-04-21: qty 1

## 2023-04-21 MED ORDER — DULOXETINE HCL 60 MG PO CPEP
60.0000 mg | ORAL_CAPSULE | Freq: Every day | ORAL | Status: DC
  Administered 2023-04-21 – 2023-04-23 (×3): 60 mg via ORAL
  Filled 2023-04-21 (×3): qty 1

## 2023-04-21 MED ORDER — POTASSIUM CHLORIDE CRYS ER 20 MEQ PO TBCR
40.0000 meq | EXTENDED_RELEASE_TABLET | Freq: Every day | ORAL | Status: AC
  Administered 2023-04-22: 40 meq via ORAL
  Filled 2023-04-21 (×2): qty 2

## 2023-04-21 MED ORDER — NALOXONE HCL 0.4 MG/ML IJ SOLN: 0.4000 mg | INTRAMUSCULAR | Status: DC | PRN

## 2023-04-21 MED ORDER — DIPHENHYDRAMINE HCL 25 MG PO CAPS
25.0000 mg | ORAL_CAPSULE | ORAL | Status: DC | PRN
  Administered 2023-04-21: 25 mg via ORAL
  Filled 2023-04-21: qty 1

## 2023-04-21 MED ORDER — MORPHINE SULFATE ER 60 MG PO TBCR
60.0000 mg | EXTENDED_RELEASE_TABLET | Freq: Two times a day (BID) | ORAL | 0 refills | Status: DC
Start: 2023-04-23 — End: 2023-05-20

## 2023-04-21 MED ORDER — KETOROLAC TROMETHAMINE 15 MG/ML IJ SOLN
15.0000 mg | INTRAMUSCULAR | Status: AC
  Administered 2023-04-21: 15 mg via INTRAVENOUS
  Filled 2023-04-21: qty 1

## 2023-04-21 MED ORDER — KETOROLAC TROMETHAMINE 15 MG/ML IJ SOLN
15.0000 mg | Freq: Four times a day (QID) | INTRAMUSCULAR | Status: DC
  Administered 2023-04-21 – 2023-04-23 (×7): 15 mg via INTRAVENOUS
  Filled 2023-04-21 (×6): qty 1

## 2023-04-21 MED ORDER — FOLIC ACID 1 MG PO TABS
1.0000 mg | ORAL_TABLET | Freq: Every day | ORAL | Status: DC
  Administered 2023-04-21 – 2023-04-23 (×3): 1 mg via ORAL
  Filled 2023-04-21 (×3): qty 1

## 2023-04-21 MED ORDER — ONDANSETRON HCL 4 MG/2ML IJ SOLN
4.0000 mg | Freq: Four times a day (QID) | INTRAMUSCULAR | Status: DC | PRN
  Administered 2023-04-21 – 2023-04-22 (×2): 4 mg via INTRAVENOUS
  Filled 2023-04-21 (×2): qty 2

## 2023-04-21 MED ORDER — MORPHINE SULFATE ER 30 MG PO TBCR
60.0000 mg | EXTENDED_RELEASE_TABLET | Freq: Two times a day (BID) | ORAL | Status: DC
  Administered 2023-04-21 – 2023-04-23 (×4): 60 mg via ORAL
  Filled 2023-04-21 (×2): qty 2
  Filled 2023-04-21: qty 4
  Filled 2023-04-21: qty 2

## 2023-04-21 MED ORDER — POTASSIUM CHLORIDE CRYS ER 20 MEQ PO TBCR
40.0000 meq | EXTENDED_RELEASE_TABLET | Freq: Once | ORAL | Status: AC
  Administered 2023-04-21: 40 meq via ORAL
  Filled 2023-04-21: qty 2

## 2023-04-21 MED ORDER — SODIUM CHLORIDE 0.45 % IV SOLN: INTRAVENOUS | Status: DC

## 2023-04-21 MED ORDER — DIPHENHYDRAMINE HCL 25 MG PO CAPS: 25.0000 mg | ORAL_CAPSULE | ORAL | Status: DC | PRN

## 2023-04-21 MED ORDER — POLYETHYLENE GLYCOL 3350 17 G PO PACK: 17.0000 g | PACK | Freq: Every day | ORAL | Status: DC | PRN

## 2023-04-21 MED ORDER — SODIUM CHLORIDE 0.9% FLUSH: 9.0000 mL | INTRAVENOUS | Status: DC | PRN

## 2023-04-21 MED ORDER — ACETAMINOPHEN 500 MG PO TABS
1000.0000 mg | ORAL_TABLET | Freq: Once | ORAL | Status: AC
  Administered 2023-04-21: 1000 mg via ORAL
  Filled 2023-04-21: qty 2

## 2023-04-21 MED ORDER — SENNOSIDES-DOCUSATE SODIUM 8.6-50 MG PO TABS
1.0000 | ORAL_TABLET | Freq: Two times a day (BID) | ORAL | Status: DC
  Administered 2023-04-21 – 2023-04-23 (×4): 1 via ORAL
  Filled 2023-04-21 (×4): qty 1

## 2023-04-21 MED ORDER — ENOXAPARIN SODIUM 40 MG/0.4ML IJ SOSY
40.0000 mg | PREFILLED_SYRINGE | INTRAMUSCULAR | Status: DC
  Filled 2023-04-21 (×2): qty 0.4

## 2023-04-21 MED ORDER — HYDROMORPHONE 1 MG/ML IV SOLN
INTRAVENOUS | Status: DC
  Administered 2023-04-21: 9.5 mg via INTRAVENOUS
  Administered 2023-04-21: 30 mg via INTRAVENOUS
  Administered 2023-04-21: 2.5 mg via INTRAVENOUS
  Administered 2023-04-22: 4 mg via INTRAVENOUS
  Administered 2023-04-22: 30 mg via INTRAVENOUS
  Administered 2023-04-22: 6.5 mg via INTRAVENOUS
  Administered 2023-04-22 (×2): 6 mg via INTRAVENOUS
  Administered 2023-04-22: 30 mg via INTRAVENOUS
  Administered 2023-04-22: 3 mg via INTRAVENOUS
  Administered 2023-04-22: 5.5 mg via INTRAVENOUS
  Administered 2023-04-23: 7.5 mg via INTRAVENOUS
  Administered 2023-04-23: 4.42 mg via INTRAVENOUS
  Administered 2023-04-23: 8.5 mg via INTRAVENOUS
  Administered 2023-04-23: 8 mg via INTRAVENOUS
  Filled 2023-04-21 (×3): qty 30

## 2023-04-21 MED ORDER — ONDANSETRON HCL 4 MG/2ML IJ SOLN
4.0000 mg | INTRAMUSCULAR | Status: DC | PRN
  Administered 2023-04-21: 4 mg via INTRAVENOUS
  Filled 2023-04-21: qty 2

## 2023-04-21 MED ORDER — KETOROLAC TROMETHAMINE 30 MG/ML IJ SOLN
15.0000 mg | Freq: Once | INTRAMUSCULAR | Status: AC
  Administered 2023-04-21: 15 mg via INTRAVENOUS
  Filled 2023-04-21: qty 1

## 2023-04-21 NOTE — Plan of Care (Signed)
  Problem: Education: Goal: Knowledge of vaso-occlusive preventative measures will improve Outcome: Progressing   Problem: Education: Goal: Knowledge of General Education information will improve Description: Including pain rating scale, medication(s)/side effects and non-pharmacologic comfort measures Outcome: Progressing   Problem: Nutrition: Goal: Adequate nutrition will be maintained Outcome: Progressing

## 2023-04-21 NOTE — Discharge Instructions (Signed)
Go to sickle cell clinic for further pain management

## 2023-04-21 NOTE — ED Triage Notes (Signed)
Pt BIBA d/t severe 10/10 pain d/t sickle cell crisis.Pain has been unrelieved by home meds Oxy and Morphine ER.

## 2023-04-21 NOTE — Progress Notes (Signed)
Patient admitted to the day hospital from [facility] for sickle cell pain crisis. Initially, patient reported bilateral hip and lower back pain rated 8/10. For pain management, patient placed on Sickle Cell Dose Dilaudid PCA, given IV Toradol, Tylenol and hydrated with IV fluids. Patient's pain remained at 8/10. Provider notified and placed orders for admission to inpatient unit for continued pain management. Report called to Verdon Cummins, RN on [facility]. Patient transferred to [facility] in wheelchair with PCA (settings 0.5/10/3 verified with Jana Half, RN prior to transfer). Vital signs wnl. Patient alert, oriented and stable at transfer.

## 2023-04-21 NOTE — ED Notes (Signed)
Patient is being discharged to sickle cell clinic. Adelina Mings PA stated to keep IV in. Nurse tech wheeling patient to sickle cell clinic now.

## 2023-04-21 NOTE — ED Provider Notes (Signed)
[place] EMERGENCY DEPARTMENT AT [facility] Provider Note   CSN: [id number] Arrival date & time: 04/21/23  0631     History  Chief Complaint  Patient presents with   Sickle Cell Pain Crisis    Pt BIBA d/t severe 10/10 pain d/t sickle cell crisis.Pain has been unrelieved by home meds Oxy and Morphine ER.    Carmen [name] is a 32 y.o. female.  Carmen [name] is a 32 y.o. female with a history of sickle cell anemia, who presents to the emergency department via EMS for evaluation of sickle cell pain crisis.  Patient reports pain in her hips legs and low back which is typical for her sickle cell pain crises.  Symptoms have been present for the past 3 days, she has been trying to manage pain with her home oxycodone and morphine but has not been able to get pain under control.  She is unsure of triggers but suspect it might be the heat, she denies any associated abdominal pain, nausea or vomiting and has been drinking a lot of water.  No chest pain, shortness of breath, cough or fever.  No associated numbness, tingling, or weakness in extremities.   The history is provided by medical records and the patient.       Home Medications Prior to Admission medications   Medication Sig Start Date End Date Taking? Authorizing Provider  betamethasone valerate ointment (VALISONE) 0.1 % APPLY TO AFFECTED AREA TWICE A DAY Patient not taking: Reported on 05/10/2022 04/07/22   Massie Maroon, FNP  cyclobenzaprine (FLEXERIL) 10 MG tablet Take 1 tablet (10 mg total) by mouth at bedtime. TAKE 1 TABLET BY MOUTH DAILY AS NEEDED FOR MUSCLE SPASMS. Patient not taking: Reported on 03/09/2023 11/18/22   Massie Maroon, FNP  DULoxetine (CYMBALTA) 30 MG capsule Take 2 capsules (60 mg total) by mouth daily. Patient taking differently: Take 60 mg by mouth at bedtime. 11/18/22 05/17/23  Massie Maroon, FNP  folic acid (FOLVITE) 1 MG tablet Take 1 tablet (1 mg total) by mouth daily. 03/15/19   Mike Gip, FNP  morphine (MS CONTIN) 60 MG 12 hr tablet Take 1 tablet (60 mg total) by mouth every 12 (twelve) hours. 03/24/23   Ivonne Andrew, NP  Oxycodone HCl 10 MG TABS Take 1 tablet (10 mg total) by mouth every 6 (six) hours as needed (pain). 04/04/23   Massie Maroon, FNP  Vitamin D, Ergocalciferol, (DRISDOL) 1.25 MG (50000 UNIT) CAPS capsule TAKE ONE CAPSULE BY MOUTH ONE TIME PER WEEK Patient taking differently: Take 50,000 Units by mouth every Saturday. 08/24/22   Massie Maroon, FNP      Allergies    Ketamine    Review of Systems   Review of Systems  Constitutional:  Negative for chills and fever.  HENT: Negative.    Respiratory:  Negative for cough and shortness of breath.   Cardiovascular:  Negative for chest pain.  Gastrointestinal:  Negative for abdominal pain, nausea and vomiting.  Genitourinary:  Negative for dysuria and frequency.  Musculoskeletal:  Positive for arthralgias, back pain and myalgias.  Neurological:  Negative for weakness and numbness.  All other systems reviewed and are negative.   Physical Exam Updated Vital Signs BP 106/71   Pulse (!) 54   Temp 97.8 F (36.6 C)   Resp 18   SpO2 100%  Physical Exam Vitals and nursing note reviewed.  Constitutional:      General: She is not  in acute distress.    Appearance: Normal appearance. She is well-developed. She is not diaphoretic.  HENT:     Head: Normocephalic and atraumatic.  Eyes:     General:        Right eye: No discharge.        Left eye: No discharge.     Pupils: Pupils are equal, round, and reactive to light.  Cardiovascular:     Rate and Rhythm: Normal rate and regular rhythm.     Pulses: Normal pulses.     Heart sounds: Normal heart sounds.  Pulmonary:     Effort: Pulmonary effort is normal. No respiratory distress.     Breath sounds: Normal breath sounds. No wheezing or rales.     Comments: Respirations equal and unlabored, patient able to speak in full sentences, lungs clear to  auscultation bilaterally  Abdominal:     General: Bowel sounds are normal. There is no distension.     Palpations: Abdomen is soft. There is no mass.     Tenderness: There is no abdominal tenderness. There is no guarding.     Comments: Abdomen soft, nondistended, nontender to palpation in all quadrants without guarding or peritoneal signs  Musculoskeletal:        General: No deformity.     Cervical back: Neck supple.  Skin:    General: Skin is warm and dry.     Capillary Refill: Capillary refill takes less than 2 seconds.  Neurological:     Mental Status: She is alert and oriented to person, place, and time.     Coordination: Coordination normal.     Comments: Speech is clear, able to follow commands Moves extremities without ataxia, coordination intact  Psychiatric:        Mood and Affect: Mood normal.        Behavior: Behavior normal.     ED Results / Procedures / Treatments   Labs (all labs ordered are listed, but only abnormal results are displayed) Labs Reviewed  CBC WITH DIFFERENTIAL/PLATELET - Abnormal; Notable for the following components:      Result Value   RBC 3.49 (*)    Hemoglobin 10.3 (*)    HCT 29.1 (*)    Platelets 462 (*)    nRBC 0.3 (*)    All other components within normal limits  RETICULOCYTES - Abnormal; Notable for the following components:   Retic Ct Pct 3.4 (*)    RBC. 3.44 (*)    Immature Retic Fract 29.9 (*)    All other components within normal limits  COMPREHENSIVE METABOLIC PANEL - Abnormal; Notable for the following components:   Potassium 3.1 (*)    Total Protein 8.3 (*)    Total Bilirubin 1.7 (*)    All other components within normal limits  I-STAT BETA HCG BLOOD, ED ([facility], [facility], [facility] ONLY)    EKG None  Radiology No results found.  Procedures Procedures    Medications Ordered in ED Medications  diphenhydrAMINE (BENADRYL) capsule 25-50 mg (25 mg Oral Given 04/21/23 0737)  ondansetron (ZOFRAN) injection 4 mg (4 mg Intravenous Given  04/21/23 0732)  ketorolac (TORADOL) 15 MG/ML injection 15 mg (15 mg Intravenous Given 04/21/23 0731)  HYDROmorphone (DILAUDID) injection 2 mg (2 mg Intravenous Given 04/21/23 0732)  HYDROmorphone (DILAUDID) injection 2 mg (2 mg Intravenous Given 04/21/23 0800)  HYDROmorphone (DILAUDID) injection 2 mg (2 mg Intravenous Given 04/21/23 0842)  potassium chloride SA (KLOR-CON M) CR tablet 40 mEq (40 mEq Oral Given 04/21/23 1030)  ED Course/ Medical Decision Making/ A&P                             Medical Decision Making Amount and/or Complexity of Data Reviewed Labs: ordered.  Risk Prescription drug management.   Female present to the ED with worsening pain over the past 3 days that is typical for her sickle cell pain crisis.  No associated chest pain, shortness of breath, cough or fever, no concern for acute chest syndrome.  No associated abdominal pain, nausea or vomiting.  Patient suspects that heat is likely triggering her symptoms but she has been unable to manage them with her home pain medications.  Labs reviewed and interpreted, mild hypokalemia but no other significant laboratory derangements, appropriate reticulocyte count, no concern for aplastic crisis.  Negative pregnancy.  After 3 doses of pain medication patient reports some mild relief but that she is still having severe pain in her hips.  Requested consult with sickle cell team for potential admission.  Case discussed with NP Armenia Hollis who reviewed patient's chart, they have space available in the sickle cell day clinic today and request that the patient be sent over for further evaluation and pain management.  Patient will be discharged.  IV in place and will be taken directly to the sickle cell clinic by ED staff member.  Patient expresses understanding and is in agreement with this plan for further care.        Final Clinical Impression(s) / ED Diagnoses Final diagnoses:  Sickle cell pain crisis [facility])    Rx / DC  Orders ED Discharge Orders     None         Legrand Rams 04/21/23 1035    Lorre Nick, MD 04/21/23 1129

## 2023-04-21 NOTE — H&P (Signed)
[facility] History and Physical   Date: 04/21/2023  Patient name: Carmen [name] Medical record number: [id number] Date of birth: 08/21/1991 Age: 32 y.o. Gender: female PCP: Massie Maroon, FNP  Attending physician: Quentin Angst, MD  Chief Complaint: Sickle cell pain   History of Present Illness: Carmen [name] is a 32 year old female with a medical history significant for sickle cell disease, chronic pain syndrome, opiate dependence and tolerance, history of depression, and anemia of chronic disease presented to sickle cell day clinic for further management of her sickle cell crisis.  Patient was treated and evaluated in the emergency department this a.m. and pain persisted despite IV Dilaudid, IV Toradol, and IV fluids.  Agree with EDP that patient was appropriate to transition to sickle cell day clinic for pain crisis.  Patient arrived in stable condition with the pain intensity of 9/10.  She attributes pain crisis to being outside and extremely warm weather.  She says that she has not been hydrating consistently.  She attempted to treat her pain crisis at home over the past 2 days without success.  Patient was taken MS Contin and oxycodone.  She currently denies headache, fever, chills, chest pain, or shortness of breath.  No urinary symptoms, nausea, vomiting, or diarrhea.  Sickle cell day infusion clinic course: Vital signs show:BP 117/67 (BP Location: Right Arm)   Pulse (!) 56   Temp (!) 97.3 F (36.3 C) (Temporal)   Resp 16   SpO2 100%  Complete metabolic panel shows potassium 3.1, total bilirubin 1.7, otherwise unremarkable.  Patient's hemoglobin is 10.3 and platelets 462,000.  Her pain persists despite IV Dilaudid PCA, Tylenol, Toradol, and IV fluids.  Patient will be admitted to [facility] for further management of her sickle cell crisis.  Meds: Medications Prior to Admission  Medication Sig Dispense Refill Last Dose   betamethasone valerate  ointment (VALISONE) 0.1 % APPLY TO AFFECTED AREA TWICE A DAY (Patient not taking: Reported on 05/10/2022) 30 g 1    cyclobenzaprine (FLEXERIL) 10 MG tablet Take 1 tablet (10 mg total) by mouth at bedtime. TAKE 1 TABLET BY MOUTH DAILY AS NEEDED FOR MUSCLE SPASMS. (Patient not taking: Reported on 03/09/2023) 30 tablet 2    DULoxetine (CYMBALTA) 30 MG capsule Take 2 capsules (60 mg total) by mouth daily. (Patient taking differently: Take 60 mg by mouth at bedtime.) 180 capsule 1    folic acid (FOLVITE) 1 MG tablet Take 1 tablet (1 mg total) by mouth daily. 90 tablet 3    morphine (MS CONTIN) 60 MG 12 hr tablet Take 1 tablet (60 mg total) by mouth every 12 (twelve) hours. 60 tablet 0    Oxycodone HCl 10 MG TABS Take 1 tablet (10 mg total) by mouth every 6 (six) hours as needed (pain). 60 tablet 0    Vitamin D, Ergocalciferol, (DRISDOL) 1.25 MG (50000 UNIT) CAPS capsule TAKE ONE CAPSULE BY MOUTH ONE TIME PER WEEK (Patient taking differently: Take 50,000 Units by mouth every Saturday.) 30 capsule 1     Allergies: Ketamine Past Medical History:  Diagnosis Date   Anxiety    Headache(784.0)    Heart murmur    Sickle cell crisis (HCC)    Syphilis 2015   Was diagnosed and received one injection of antibiotics   Thrombocytosis 11/22/2014     CBC Latest Ref Rng & Units 12/13/2018 12/11/2018 12/10/2018 WBC 4.0 - 10.5 K/uL 14.8(H) 13.2(H) 15.9(H) Hemoglobin 12.0 - 15.0 g/dL 8.2(L) 7.7(L) 8.1(L) Hematocrit  36.0 - 46.0 % 23.7(L) 23.2(L) 24.5(L) Platelets 150 - 400 K/uL 326 399 388     Past Surgical History:  Procedure Laterality Date   CESAREAN SECTION N/A 02/05/2019   Procedure: CESAREAN SECTION;  Surgeon: Hermina Staggers, MD;  Location: [facility] LD ORS;  Service: Obstetrics;  Laterality: N/A;   CHOLECYSTECTOMY N/A 11/30/2014   Procedure: LAPAROSCOPIC CHOLECYSTECTOMY SINGLE SITE WITH INTRAOPERATIVE CHOLANGIOGRAM;  Surgeon: Karie Soda, MD;  Location: [facility] ORS;  Service: General;  Laterality: N/A;   SPLENECTOMY      Family History  Problem Relation Age of Onset   Hypertension Mother    Sickle cell anemia Sister    Kidney disease Sister        Lupus   Arthritis Sister    Sickle cell anemia Sister    Sickle cell trait Sister    Heart disease Maternal Aunt        CABG   Heart disease Maternal Uncle        CABG   Lupus Sister    Social History   Socioeconomic History   Marital status: Single    Spouse name: Not on file   Number of children: Not on file   Years of education: Not on file   Highest education level: 12th grade  Occupational History   Not on file  Tobacco Use   Smoking status: Some Days    Types: Cigarettes   Smokeless tobacco: Never  Vaping Use   Vaping Use: Never used  Substance and Sexual Activity   Alcohol use: No   Drug use: No   Sexual activity: Yes    Birth control/protection: Injection  Other Topics Concern   Not on file  Social History Narrative   Not on file   Social Determinants of Health   Financial Resource Strain: Low Risk  (03/09/2023)   Overall Financial Resource Strain (CARDIA)    Difficulty of Paying Living Expenses: Not hard at all  Food Insecurity: No Food Insecurity (03/21/2023)   Hunger Vital Sign    Worried About Running Out of Food in the Last Year: Never true    Ran Out of Food in the Last Year: Never true  Transportation Needs: Unmet Transportation Needs (03/21/2023)   PRAPARE - Administrator, Civil Service (Medical): Yes    Lack of Transportation (Non-Medical): No  Physical Activity: Insufficiently Active (03/09/2023)   Exercise Vital Sign    Days of Exercise per Week: 2 days    Minutes of Exercise per Session: 20 min  Stress: No Stress Concern Present (03/09/2023)   [employer] of Occupational Health - Occupational Stress Questionnaire    Feeling of Stress : Not at all  Social Connections: Moderately Isolated (03/09/2023)   Social Connection and Isolation Panel [NHANES]    Frequency of Communication with Friends and  Family: More than three times a week    Frequency of Social Gatherings with Friends and Family: More than three times a week    Attends Religious Services: 1 to 4 times per year    Active Member of [employer] or Organizations: No    Attends Banker Meetings: Not on file    Marital Status: Never married  Intimate Partner Violence: Not At Risk (03/21/2023)   Humiliation, Afraid, Rape, and Kick questionnaire    Fear of Current or Ex-Partner: No    Emotionally Abused: No    Physically Abused: No    Sexually Abused: No   Review of Systems  Constitutional:  Negative.   HENT: Negative.    Eyes: Negative.   Respiratory: Negative.    Cardiovascular: Negative.   Genitourinary: Negative.   Musculoskeletal:  Positive for back pain and joint pain.  Skin: Negative.   Neurological: Negative.   Psychiatric/Behavioral: Negative.       Physical Exam: There were no vitals taken for this visit. Physical Exam Constitutional:      Appearance: She is obese.  Eyes:     Pupils: Pupils are equal, round, and reactive to light.  Cardiovascular:     Rate and Rhythm: Normal rate and regular rhythm.  Pulmonary:     Effort: Pulmonary effort is normal.  Abdominal:     General: Bowel sounds are normal.  Skin:    General: Skin is warm.  Neurological:     General: No focal deficit present.     Mental Status: She is alert. Mental status is at baseline.  Psychiatric:        Mood and Affect: Mood normal.        Behavior: Behavior normal.        Thought Content: Thought content normal.        Judgment: Judgment normal.      Lab results: Results for orders placed or performed during the hospital encounter of 04/21/23 (from the past 24 hour(s))  CBC WITH DIFFERENTIAL     Status: Abnormal   Collection Time: 04/21/23  7:15 AM  Result Value Ref Range   WBC 7.0 4.0 - 10.5 K/uL   RBC 3.49 (L) 3.87 - 5.11 MIL/uL   Hemoglobin 10.3 (L) 12.0 - 15.0 g/dL   HCT 54.0 (L) 98.1 - 19.1 %   MCV 83.4 80.0  - 100.0 fL   MCH 29.5 26.0 - 34.0 pg   MCHC 35.4 30.0 - 36.0 g/dL   RDW 47.8 29.5 - 62.1 %   Platelets 462 (H) 150 - 400 K/uL   nRBC 0.3 (H) 0.0 - 0.2 %   Neutrophils Relative % 58 %   Neutro Abs 4.0 1.7 - 7.7 K/uL   Lymphocytes Relative 30 %   Lymphs Abs 2.1 0.7 - 4.0 K/uL   Monocytes Relative 12 %   Monocytes Absolute 0.9 0.1 - 1.0 K/uL   Eosinophils Relative 0 %   Eosinophils Absolute 0.0 0.0 - 0.5 K/uL   Basophils Relative 0 %   Basophils Absolute 0.0 0.0 - 0.1 K/uL   Immature Granulocytes 0 %   Abs Immature Granulocytes 0.03 0.00 - 0.07 K/uL  Reticulocytes     Status: Abnormal   Collection Time: 04/21/23  7:15 AM  Result Value Ref Range   Retic Ct Pct 3.4 (H) 0.4 - 3.1 %   RBC. 3.44 (L) 3.87 - 5.11 MIL/uL   Retic Count, Absolute 116.3 19.0 - 186.0 K/uL   Immature Retic Fract 29.9 (H) 2.3 - 15.9 %  Comprehensive metabolic panel     Status: Abnormal   Collection Time: 04/21/23  7:15 AM  Result Value Ref Range   Sodium 139 135 - 145 mmol/L   Potassium 3.1 (L) 3.5 - 5.1 mmol/L   Chloride 110 98 - 111 mmol/L   CO2 22 22 - 32 mmol/L   Glucose, Bld 99 70 - 99 mg/dL   BUN 8 6 - 20 mg/dL   Creatinine, Ser 3.08 0.44 - 1.00 mg/dL   Calcium 9.5 8.9 - 65.7 mg/dL   Total Protein 8.3 (H) 6.5 - 8.1 g/dL   Albumin 4.5 3.5 - 5.0 g/dL  AST 20 15 - 41 U/L   ALT 18 0 - 44 U/L   Alkaline Phosphatase 62 38 - 126 U/L   Total Bilirubin 1.7 (H) 0.3 - 1.2 mg/dL   GFR, Estimated >16 >10 mL/min   Anion gap 7 5 - 15  I-Stat beta hCG blood, ED     Status: None   Collection Time: 04/21/23  7:39 AM  Result Value Ref Range   I-stat hCG, quantitative <5.0 <5 mIU/mL   Comment 3            Imaging results:  No results found.   Assessment & Plan: Sickle cell disease with pain crisis: Patient will be admitted to [facility] for management of her sickle cell crisis.  Continue IV Dilaudid PCA with settings of 0.5 mg, 10-minute lockout, and 3 mg/h. IV fluids, 0.45% saline at 100  ml/hr Toradol 15 mg IV every 6 hours OxyContin 60 mg every 12 hours Monitor vital signs very closely, reevaluate pain scale regularly, and supplemental oxygen as needed. Patient will be reevaluated for pain in the context of function and relationship to baseline as her care progresses.  Chronic pain syndrome: Continue home medication  Anemia of chronic disease: Hemoglobin is stable and consistent with patient's baseline, there is no clinical indication for blood transfusion at this time.  Hypokalemia: Potassium 3.1.  Replete.  Follow labs in AM.     Nolon Nations  APRN, MSN, FNP-C Patient Care [facility] [address] [place], [place] [address] [phone]  04/21/2023, 10:53 AM

## 2023-04-21 NOTE — Progress Notes (Signed)
Reviewed PDMP substance reporting system prior to prescribing opiate medications. No inconsistencies noted.  Meds ordered this encounter  Medications   morphine (MS CONTIN) 60 MG 12 hr tablet    Sig: Take 1 tablet (60 mg total) by mouth every 12 (twelve) hours.    Dispense:  60 tablet    Refill:  0    Order Specific Question:   Supervising Provider    Answer:   [name], [name] [[id number]]     Carmen Cyr Moore Parlee Amescua  APRN, MSN, FNP-C [facility] [address]  [place], [place] [address] [phone]  

## 2023-04-22 DIAGNOSIS — D5700 Hb-SS disease with crisis, unspecified: Secondary | ICD-10-CM | POA: Diagnosis not present

## 2023-04-22 LAB — BASIC METABOLIC PANEL
Anion gap: 7 (ref 5–15)
BUN: 13 mg/dL (ref 6–20)
CO2: 20 mmol/L — ABNORMAL LOW (ref 22–32)
Calcium: 8.9 mg/dL (ref 8.9–10.3)
Chloride: 106 mmol/L (ref 98–111)
Creatinine, Ser: 0.59 mg/dL (ref 0.44–1.00)
GFR, Estimated: >60 mL/min (ref >60)
Glucose, Bld: 88 mg/dL (ref 70–99)
Potassium: 3.6 mmol/L (ref 3.5–5.1)
Sodium: 133 mmol/L — ABNORMAL LOW (ref 135–145)

## 2023-04-22 LAB — CBC WITH DIFFERENTIAL/PLATELET
Abs Immature Granulocytes: 0.02 10*3/uL (ref 0.00–0.07)
Basophils Absolute: 0.1 10*3/uL (ref 0.0–0.1)
Basophils Relative: 1 %
Eosinophils Absolute: 0.3 10*3/uL (ref 0.0–0.5)
Eosinophils Relative: 3 %
HCT: 26.6 % — ABNORMAL LOW (ref 36.0–46.0)
Hemoglobin: 9.3 g/dL — ABNORMAL LOW (ref 12.0–15.0)
Immature Granulocytes: 0 %
Lymphocytes Relative: 49 %
Lymphs Abs: 4.2 10*3/uL — ABNORMAL HIGH (ref 0.7–4.0)
MCH: 29.6 pg (ref 26.0–34.0)
MCHC: 35 g/dL (ref 30.0–36.0)
MCV: 84.7 fL (ref 80.0–100.0)
Monocytes Absolute: 1.2 10*3/uL — ABNORMAL HIGH (ref 0.1–1.0)
Monocytes Relative: 14 %
Neutro Abs: 2.8 10*3/uL (ref 1.7–7.7)
Neutrophils Relative %: 33 %
Platelets: 356 10*3/uL (ref 150–400)
RBC: 3.14 MIL/uL — ABNORMAL LOW (ref 3.87–5.11)
RDW: 13.9 % (ref 11.5–15.5)
WBC: 8.5 10*3/uL (ref 4.0–10.5)
nRBC: 0 % (ref 0.0–0.2)

## 2023-04-22 LAB — HIV ANTIBODY (ROUTINE TESTING W REFLEX): HIV Screen 4th Generation wRfx: NONREACTIVE

## 2023-04-22 MED ORDER — OXYCODONE HCL 5 MG PO TABS
10.0000 mg | ORAL_TABLET | Freq: Four times a day (QID) | ORAL | Status: DC | PRN
  Administered 2023-04-22: 10 mg via ORAL
  Filled 2023-04-22: qty 2

## 2023-04-22 NOTE — Progress Notes (Signed)
Subjective: Carmen [name] is a 32 year old female with a medical history significant for sickle cell disease, chronic pain syndrome, opiate dependence and tolerance, and history of anemia of chronic disease that was admitted for sickle cell pain crisis. Patient continues to have pain primarily to her low back and lower extremities.  She rates her pain as 6/10.  She states that she cannot manage at home at this time. Patient denies any chest pain, shortness of breath, dizziness, headache, urinary symptoms, nausea, vomiting, or diarrhea.  Objective:  Vital signs in last 24 hours:  Vitals:   04/22/23 0544 04/22/23 0548 04/22/23 0821 04/22/23 1044  BP:  (!) 135/102  126/72  Pulse:  (!) 59  60  Resp: 15 20 16 15   Temp:  97.9 F (36.6 C)  98 F (36.7 C)  TempSrc:  Oral  Oral  SpO2: 97% 100% 98% 92%  Weight:      Height:        Intake/Output from previous day:   Intake/Output Summary (Last 24 hours) at 04/22/2023 1100 Last data filed at 04/22/2023 0455 Gross per 24 hour  Intake 1767.08 ml  Output --  Net 1767.08 ml    Physical Exam: General: Alert, awake, oriented x3, in no acute distress.  HEENT: [place]/AT PEERL, EOMI Neck: Trachea midline,  no masses, no thyromegal,y no JVD, no carotid bruit OROPHARYNX:  Moist, No exudate/ erythema/lesions.  Heart: Regular rate and rhythm, without murmurs, rubs, gallops, PMI non-displaced, no heaves or thrills on palpation.  Lungs: Clear to auscultation, no wheezing or rhonchi noted. No increased vocal fremitus resonant to percussion  Abdomen: Soft, nontender, nondistended, positive bowel sounds, no masses no hepatosplenomegaly noted..  Neuro: No focal neurological deficits noted cranial nerves II through XII grossly intact. DTRs 2+ bilaterally upper and lower extremities. Strength 5 out of 5 in bilateral upper and lower extremities. Musculoskeletal: No warm swelling or erythema around joints, no spinal tenderness noted. Psychiatric: Patient alert and  oriented x3, good insight and cognition, good recent to remote recall. Lymph node survey: No cervical axillary or inguinal lymphadenopathy noted.  Lab Results:  Basic Metabolic Panel:    Component Value Date/Time   NA 133 (L) 04/22/2023 0922   NA 142 08/24/2022 1213   K 3.6 04/22/2023 0922   CL 106 04/22/2023 0922   CO2 20 (L) 04/22/2023 0922   BUN 13 04/22/2023 0922   BUN 10 08/24/2022 1213   CREATININE 0.59 04/22/2023 0922   GLUCOSE 88 04/22/2023 0922   CALCIUM 8.9 04/22/2023 0922   CBC:    Component Value Date/Time   WBC 7.0 04/21/2023 0715   HGB 10.3 (L) 04/21/2023 0715   HGB 9.7 (L) 08/24/2022 1213   HCT 29.1 (L) 04/21/2023 0715   HCT 30.1 (L) 08/24/2022 1213   PLT 462 (H) 04/21/2023 0715   PLT 477 (H) 08/24/2022 1213   MCV 83.4 04/21/2023 0715   MCV 88 08/24/2022 1213   NEUTROABS 4.0 04/21/2023 0715   NEUTROABS 3.3 08/24/2022 1213   LYMPHSABS 2.1 04/21/2023 0715   LYMPHSABS 2.8 08/24/2022 1213   MONOABS 0.9 04/21/2023 0715   EOSABS 0.0 04/21/2023 0715   EOSABS 0.2 08/24/2022 1213   BASOSABS 0.0 04/21/2023 0715   BASOSABS 0.1 08/24/2022 1213    No results found for this or any previous visit (from the past 240 hour(s)).  Studies/Results: No results found.  Medications: Scheduled Meds:  DULoxetine  60 mg Oral Daily   enoxaparin (LOVENOX) injection  40 mg Subcutaneous Q24H  folic acid  1 mg Oral Daily   HYDROmorphone   Intravenous Q4H   ketorolac  15 mg Intravenous Q6H   morphine  60 mg Oral Q12H   potassium chloride SA  40 mEq Oral Daily   senna-docusate  1 tablet Oral BID   Continuous Infusions:  sodium chloride 100 mL/hr at 04/22/23 0542   PRN Meds:.diphenhydrAMINE, naloxone **AND** sodium chloride flush, ondansetron (ZOFRAN) IV, oxyCODONE, polyethylene glycol  Consultants: None  Procedures: None  Antibiotics: None  Assessment/Plan: Principal Problem:   Sickle cell pain crisis (HCC) Active Problems:   Anemia of chronic disease    Hypokalemia   Chronic musculoskeletal pain   Moderate episode of recurrent major depressive disorder (HCC)  Sickle cell disease with pain crisis: Continue IV Dilaudid PCA Toradol 15 mg IV every 6 hours MS Contin 60 mg every 12 hours Oxycodone every 6 hours as needed for severe breakthrough pain Continue IV fluids to [facility] Monitor vital signs very closely, reevaluate pain scale regularly, and supplemental oxygen as needed.  Chronic pain syndrome: Continue home medications  Anemia of chronic disease: Patient's hemoglobin remained stable and consistent with her baseline.  There is no clinical indication for blood transfusion at this time.  Monitor closely.  History of depression: Continue home medications.  No suicidal or homicidal intentions.  Monitor closely.   Code Status: Full Code Family Communication: N/A Disposition Plan: Not yet ready for discharge.  Discharge planned for 04/23/2023  Nolon Nations  APRN, MSN, FNP-C Patient Care [facility] [address] [place], [place] [address] [phone]  If 7PM-7AM, please contact night-coverage.  04/22/2023, 11:00 AM  LOS: 1 day

## 2023-04-22 NOTE — Progress Notes (Signed)
Patient refused to get labs this am until after 8am.

## 2023-04-23 DIAGNOSIS — D5700 Hb-SS disease with crisis, unspecified: Secondary | ICD-10-CM | POA: Diagnosis not present

## 2023-04-23 LAB — CBC
HCT: 25.5 % — ABNORMAL LOW (ref 36.0–46.0)
Hemoglobin: 9.1 g/dL — ABNORMAL LOW (ref 12.0–15.0)
MCH: 29.9 pg (ref 26.0–34.0)
MCHC: 35.7 g/dL (ref 30.0–36.0)
MCV: 83.9 fL (ref 80.0–100.0)
Platelets: 337 10*3/uL (ref 150–400)
RBC: 3.04 MIL/uL — ABNORMAL LOW (ref 3.87–5.11)
RDW: 14.9 % (ref 11.5–15.5)
WBC: 13.7 10*3/uL — ABNORMAL HIGH (ref 4.0–10.5)
nRBC: 0.1 % (ref 0.0–0.2)

## 2023-04-23 NOTE — Discharge Summary (Signed)
Physician Discharge Summary  Carmen [name] ZOX:[id number] DOB: 26-May-1991 DOA: 04/21/2023  PCP: Massie Maroon, FNP  Admit date: 04/21/2023  Discharge date: 04/23/2023  Discharge Diagnoses:  Principal Problem:   Sickle cell pain crisis (HCC) Active Problems:   Anemia of chronic disease   Hypokalemia   Chronic musculoskeletal pain   Moderate episode of recurrent major depressive disorder [facility])   Discharge Condition: Stable  Disposition:   Follow-up Information     Massie Maroon, FNP. Schedule an appointment as soon as possible for a visit in 1 week(s).   Specialty: Family Medicine Contact information: [address] [place] [address] [phone]                Carmen [name] is discharged home in good condition and is to follow up with Massie Maroon, FNP this week to have labs evaluated. SCHERYL SANBORN is instructed to increase activity slowly and balance with rest for the next few days, and use prescribed medication to complete treatment of pain  Diet: Regular Wt Readings from Last 3 Encounters:  04/21/23 86.5 kg  03/21/23 85.6 kg  03/09/23 86.5 kg    History of present illness:  Carmen [name] is a 32 year old female with a medical history significant for sickle cell disease, chronic pain syndrome, opiate dependence and tolerance, history of depression, and anemia of chronic disease presented to sickle cell day clinic for further management of her sickle cell crisis.  Carmen [name] was treated and evaluated in the emergency department this a.m. and pain persisted despite IV Dilaudid, IV Toradol, and IV fluids.  Agree with EDP that Carmen [name] was appropriate to transition to sickle cell day clinic for pain crisis.  Carmen [name] arrived in stable condition with the pain intensity of 9/10.  She attributes pain crisis to being outside and extremely warm weather.  She says that she has not been hydrating consistently.  She attempted to treat her pain crisis at home over the  past 2 days without success.  Carmen [name] was taken MS Contin and oxycodone.  She currently denies headache, fever, chills, chest pain, or shortness of breath.  No urinary symptoms, nausea, vomiting, or diarrhea.   Sickle cell day infusion clinic course: Vital signs show:BP 117/67 (BP Location: Right Arm)   Pulse (!) 56   Temp (!) 97.3 F (36.3 C) (Temporal)   Resp 16   SpO2 100%  Complete metabolic panel shows potassium 3.1, total bilirubin 1.7, otherwise unremarkable.  Carmen [name]'s hemoglobin is 10.3 and platelets 462,000.  Her pain persists despite IV Dilaudid PCA, Tylenol, Toradol, and IV fluids.  Carmen [name] will be admitted to [facility] for further management of her sickle cell crisis.  Hospital Course:  Carmen [name] was admitted for sickle cell pain crisis and managed appropriately with IVF, IV Dilaudid via PCA and IV Toradol, as well as other adjunct therapies per sickle cell pain management protocols.  Pain slowly responded to above regimen.  Hemoglobin remained stable at baseline throughout this admission, Carmen [name] did not require any blood transfusion.  Her low potassium level was repleted.  As at today, Carmen [name] has been successfully weaned off IV Dilaudid via PCA and transitioned to her home pain medications with good pain control.  As at today, Carmen [name] is tolerating p.o. intake with no restrictions and ambulating well with no significant pain.  Carmen [name] requested to be discharged home. Carmen [name] was therefore discharged home today in a hemodynamically stable condition.   Carmen [name] will follow-up with PCP within 1 week  of this discharge. Carmen [name] was counseled extensively about nonpharmacologic means of pain management, Carmen [name] verbalized understanding and was appreciative of  the care received during this admission.   We discussed the need for good hydration, monitoring of hydration status, avoidance of heat, cold, stress, and infection triggers. We discussed the need to be adherent with taking Hydrea and  other home medications. Carmen [name] was reminded of the need to seek medical attention immediately if any symptom of bleeding, anemia, or infection occurs.  Discharge Exam: Vitals:   04/23/23 0808 04/23/23 1002  BP:  (!) 149/81  Pulse:  89  Resp: 16 (!) 22  Temp:  98.5 F (36.9 C)  SpO2: 94% 98%   Vitals:   04/22/23 2152 04/23/23 0536 04/23/23 0808 04/23/23 1002  BP: 125/87 (!) 123/47  (!) 149/81  Pulse: 72 72  89  Resp: 18 18 16  (!) 22  Temp: 98.3 F (36.8 C) 98.5 F (36.9 C)  98.5 F (36.9 C)  TempSrc: Oral Oral  Oral  SpO2: 100% 100% 94% 98%  Weight:      Height:        General appearance : Awake, alert, not in any distress. Speech Clear. Not toxic looking HEENT: Atraumatic and Normocephalic, pupils equally reactive to light and accomodation Neck: Supple, no JVD. No cervical lymphadenopathy.  Chest: Good air entry bilaterally, no added sounds  CVS: S1 S2 regular, no murmurs.  Abdomen: Bowel sounds present, Non tender and not distended with no gaurding, rigidity or rebound. Extremities: B/L Lower Ext shows no edema, both legs are warm to touch Neurology: Awake alert, and oriented X 3, CN II-XII intact, Non focal Skin: No Rash  Discharge Instructions  Discharge Instructions     Diet - low sodium heart healthy   Complete by: As directed    Increase activity slowly   Complete by: As directed       Allergies as of 04/23/2023       Reactions   Ketamine Hives, Nausea And Vomiting, Other (See Comments)   Makes the Carmen [name] "feel funny" also        Medication List     TAKE these medications    betamethasone valerate ointment 0.1 % Commonly known as: VALISONE APPLY TO AFFECTED AREA TWICE A DAY   cyclobenzaprine 10 MG tablet Commonly known as: FLEXERIL Take 1 tablet (10 mg total) by mouth at bedtime. TAKE 1 TABLET BY MOUTH DAILY AS NEEDED FOR MUSCLE SPASMS.   DULoxetine 30 MG capsule Commonly known as: CYMBALTA Take 2 capsules (60 mg total) by mouth daily.    folic acid 1 MG tablet Commonly known as: FOLVITE Take 1 tablet (1 mg total) by mouth daily.   morphine 60 MG 12 hr tablet Commonly known as: MS CONTIN Take 1 tablet (60 mg total) by mouth every 12 (twelve) hours.   Oxycodone HCl 10 MG Tabs Take 1 tablet (10 mg total) by mouth every 6 (six) hours as needed (pain).   Vitamin D (Ergocalciferol) 1.25 MG (50000 UNIT) Caps capsule Commonly known as: DRISDOL TAKE ONE CAPSULE BY MOUTH ONE TIME PER WEEK What changed:  how much to take how to take this when to take this additional instructions        The results of significant diagnostics from this hospitalization (including imaging, microbiology, ancillary and laboratory) are listed below for reference.    Significant Diagnostic Studies: No results found.  Microbiology: No results found for this or any previous visit (from the past 240 hour(s)).  Labs: Basic Metabolic Panel: Recent Labs  Lab 04/21/23 0715 04/22/23 0922  NA 139 133*  K 3.1* 3.6  CL 110 106  CO2 22 20*  GLUCOSE 99 88  BUN 8 13  CREATININE 0.71 0.59  CALCIUM 9.5 8.9   Liver Function Tests: Recent Labs  Lab 04/21/23 0715  AST 20  ALT 18  ALKPHOS 62  BILITOT 1.7*  PROT 8.3*  ALBUMIN 4.5   No results for input(s): "LIPASE", "AMYLASE" in the last 168 hours. No results for input(s): "AMMONIA" in the last 168 hours. CBC: Recent Labs  Lab 04/21/23 0715 04/22/23 1446 04/23/23 0759  WBC 7.0 8.5 13.7*  NEUTROABS 4.0 2.8  --   HGB 10.3* 9.3* 9.1*  HCT 29.1* 26.6* 25.5*  MCV 83.4 84.7 83.9  PLT 462* 356 337   Cardiac Enzymes: No results for input(s): "CKTOTAL", "CKMB", "CKMBINDEX", "TROPONINI" in the last 168 hours. BNP: Invalid input(s): "POCBNP" CBG: No results for input(s): "GLUCAP" in the last 168 hours.  Time coordinating discharge: 50 minutes  Signed:  Jag Lenz  [facility] 04/23/2023, 10:39 AM

## 2023-04-25 ENCOUNTER — Telehealth: Payer: Self-pay

## 2023-04-25 NOTE — Transitions of Care (Post Inpatient/ED Visit) (Unsigned)
   04/25/2023  Name: ANIESA BOBACK MRN: [id number] DOB: [birth year]  Today's TOC FU Call Status: Today's TOC FU Call Status:: Unsuccessul Call (1st Attempt) Unsuccessful Call (1st Attempt) Date: 04/25/23  Attempted to reach the patient regarding the most recent Inpatient/ED visit.  Follow Up Plan: Additional outreach attempts will be made to reach the patient to complete the Transitions of Care (Post Inpatient/ED visit) call.   Signature   Woodfin Ganja LPN [facility] Nurse Health Advisor Direct Dial [phone]

## 2023-04-26 NOTE — Transitions of Care (Post Inpatient/ED Visit) (Signed)
   04/26/2023  Name: Carmen [name] MRN: [id number] DOB: 1991/03/11  Today's TOC FU Call Status: Today's TOC FU Call Status:: Unsuccessful Call (2nd Attempt) Unsuccessful Call (1st Attempt) Date: 04/25/23 Unsuccessful Call (2nd Attempt) Date: 04/26/23  Attempted to reach the patient regarding the most recent Inpatient/ED visit.  Follow Up Plan: Additional outreach attempts will be made to reach the patient to complete the Transitions of Care (Post Inpatient/ED visit) call.   Signature  Woodfin Ganja LPN [facility] Nurse Health Advisor Direct Dial [phone]

## 2023-04-27 NOTE — Transitions of Care (Post Inpatient/ED Visit) (Signed)
   04/27/2023  Name: Carmen [name] MRN: [id number] DOB: 07/16/1991  Today's TOC FU Call Status: Today's TOC FU Call Status:: Unsuccessful Call (3rd Attempt) Unsuccessful Call (1st Attempt) Date: 04/25/23 Unsuccessful Call (2nd Attempt) Date: 04/26/23 Unsuccessful Call (3rd Attempt) Date: 04/27/23 [facility] FU Call Complete Date: 04/27/23  Attempted to reach the patient regarding the most recent Inpatient/ED visit.  Follow Up Plan: No further outreach attempts will be made at this time. We have been unable to contact the patient.  Signature   Woodfin Ganja LPN [facility] Nurse Health Advisor Direct Dial [phone]

## 2023-05-06 ENCOUNTER — Other Ambulatory Visit: Payer: Self-pay | Admitting: Family Medicine

## 2023-05-06 DIAGNOSIS — D571 Sickle-cell disease without crisis: Secondary | ICD-10-CM

## 2023-05-06 DIAGNOSIS — Z79891 Long term (current) use of opiate analgesic: Secondary | ICD-10-CM

## 2023-05-06 MED ORDER — OXYCODONE HCL 10 MG PO TABS
10.0000 mg | ORAL_TABLET | Freq: Four times a day (QID) | ORAL | 0 refills | Status: DC | PRN
Start: 2023-05-07 — End: 2023-05-30

## 2023-05-06 NOTE — Telephone Encounter (Signed)
Reviewed PDMP substance reporting system prior to prescribing opiate medications. No inconsistencies noted.   Meds ordered this encounter  Medications  . Oxycodone HCl 10 MG TABS    Sig: Take 1 tablet (10 mg total) by mouth every 6 (six) hours as needed (pain).    Dispense:  60 tablet    Refill:  0    Order Specific Question:   Supervising Provider    Answer:   [name], [name] [[id number]]     Carmen [name] Auren Valdes  APRN, MSN, FNP-C [facility] [address]  [place], [place] [address] [phone]  

## 2023-05-06 NOTE — Telephone Encounter (Signed)
From: [name] To: Office of [name], [place] Sent: 05/05/2023 10:45 PM EDT Subject: Medication Renewal Request  Refills have been requested for the following medications:   Oxycodone HCl 10 MG TABS [Carmen [name]]  Patient Comment: In locked-in program with Medicaid   Preferred pharmacy: [id number] - [place], [place] - [address] AT [address] Delivery method: [employer]

## 2023-05-20 ENCOUNTER — Other Ambulatory Visit: Payer: Self-pay | Admitting: Family Medicine

## 2023-05-20 DIAGNOSIS — D571 Sickle-cell disease without crisis: Secondary | ICD-10-CM

## 2023-05-20 DIAGNOSIS — Z79891 Long term (current) use of opiate analgesic: Secondary | ICD-10-CM

## 2023-05-20 MED ORDER — MORPHINE SULFATE ER 60 MG PO TBCR
60.0000 mg | EXTENDED_RELEASE_TABLET | Freq: Two times a day (BID) | ORAL | 0 refills | Status: DC
Start: 2023-05-23 — End: 2023-06-21

## 2023-05-20 NOTE — Telephone Encounter (Signed)
Reviewed PDMP substance reporting system prior to prescribing opiate medications. No inconsistencies noted.  Meds ordered this encounter  Medications   morphine (MS CONTIN) 60 MG 12 hr tablet    Sig: Take 1 tablet (60 mg total) by mouth every 12 (twelve) hours.    Dispense:  60 tablet    Refill:  0    Order Specific Question:   Supervising Provider    Answer:   [name], [name] [[id number]]     Lachina Moore Hollis  APRN, MSN, FNP-C Patient [facility] [address]  [place], [place] [address] [phone]  

## 2023-05-22 ENCOUNTER — Inpatient Hospital Stay (HOSPITAL_COMMUNITY)
Admission: EM | Admit: 2023-05-22 | Discharge: 2023-05-25 | DRG: 812 | Disposition: A | Discharge status: Home / Self Care | Payer: MEDICAID | Attending: Internal Medicine | Admitting: Internal Medicine

## 2023-05-22 ENCOUNTER — Emergency Department (HOSPITAL_COMMUNITY): Payer: MEDICAID

## 2023-05-22 ENCOUNTER — Encounter (HOSPITAL_COMMUNITY): Payer: Self-pay

## 2023-05-22 ENCOUNTER — Other Ambulatory Visit: Payer: Self-pay

## 2023-05-22 DIAGNOSIS — Z832 Family history of diseases of the blood and blood-forming organs and certain disorders involving the immune mechanism: Secondary | ICD-10-CM | POA: Diagnosis not present

## 2023-05-22 DIAGNOSIS — E860 Dehydration: Secondary | ICD-10-CM | POA: Diagnosis present

## 2023-05-22 DIAGNOSIS — Z79899 Other long term (current) drug therapy: Secondary | ICD-10-CM | POA: Diagnosis not present

## 2023-05-22 DIAGNOSIS — Z9081 Acquired absence of spleen: Secondary | ICD-10-CM | POA: Diagnosis not present

## 2023-05-22 DIAGNOSIS — D638 Anemia in other chronic diseases classified elsewhere: Secondary | ICD-10-CM | POA: Diagnosis present

## 2023-05-22 DIAGNOSIS — Z1152 Encounter for screening for COVID-19: Secondary | ICD-10-CM | POA: Diagnosis not present

## 2023-05-22 DIAGNOSIS — Z79891 Long term (current) use of opiate analgesic: Secondary | ICD-10-CM | POA: Diagnosis not present

## 2023-05-22 DIAGNOSIS — F322 Major depressive disorder, single episode, severe without psychotic features: Secondary | ICD-10-CM | POA: Diagnosis present

## 2023-05-22 DIAGNOSIS — G894 Chronic pain syndrome: Secondary | ICD-10-CM | POA: Diagnosis present

## 2023-05-22 DIAGNOSIS — E876 Hypokalemia: Secondary | ICD-10-CM | POA: Diagnosis present

## 2023-05-22 DIAGNOSIS — F331 Major depressive disorder, recurrent, moderate: Secondary | ICD-10-CM | POA: Diagnosis present

## 2023-05-22 DIAGNOSIS — Z841 Family history of disorders of kidney and ureter: Secondary | ICD-10-CM

## 2023-05-22 DIAGNOSIS — Z8261 Family history of arthritis: Secondary | ICD-10-CM | POA: Diagnosis not present

## 2023-05-22 DIAGNOSIS — F17200 Nicotine dependence, unspecified, uncomplicated: Secondary | ICD-10-CM | POA: Diagnosis present

## 2023-05-22 DIAGNOSIS — Z8249 Family history of ischemic heart disease and other diseases of the circulatory system: Secondary | ICD-10-CM | POA: Diagnosis not present

## 2023-05-22 DIAGNOSIS — Z8269 Family history of other diseases of the musculoskeletal system and connective tissue: Secondary | ICD-10-CM | POA: Diagnosis not present

## 2023-05-22 DIAGNOSIS — D5700 Hb-SS disease with crisis, unspecified: Principal | ICD-10-CM

## 2023-05-22 DIAGNOSIS — Z720 Tobacco use: Secondary | ICD-10-CM | POA: Diagnosis present

## 2023-05-22 DIAGNOSIS — F17210 Nicotine dependence, cigarettes, uncomplicated: Secondary | ICD-10-CM | POA: Diagnosis present

## 2023-05-22 LAB — CBC WITH DIFFERENTIAL/PLATELET
Abs Immature Granulocytes: 0.02 10*3/uL (ref 0.00–0.07)
Basophils Absolute: 0 10*3/uL (ref 0.0–0.1)
Basophils Relative: 0 %
Eosinophils Absolute: 0.1 10*3/uL (ref 0.0–0.5)
Eosinophils Relative: 1 %
HCT: 27.9 % — ABNORMAL LOW (ref 36.0–46.0)
Hemoglobin: 10 g/dL — ABNORMAL LOW (ref 12.0–15.0)
Immature Granulocytes: 0 %
Lymphocytes Relative: 34 %
Lymphs Abs: 2.4 10*3/uL (ref 0.7–4.0)
MCH: 29.5 pg (ref 26.0–34.0)
MCHC: 35.8 g/dL (ref 30.0–36.0)
MCV: 82.3 fL (ref 80.0–100.0)
Monocytes Absolute: 1 10*3/uL (ref 0.1–1.0)
Monocytes Relative: 14 %
Neutro Abs: 3.7 10*3/uL (ref 1.7–7.7)
Neutrophils Relative %: 51 %
Platelets: 493 10*3/uL — ABNORMAL HIGH (ref 150–400)
RBC: 3.39 MIL/uL — ABNORMAL LOW (ref 3.87–5.11)
RDW: 13.2 % (ref 11.5–15.5)
WBC: 7.2 10*3/uL (ref 4.0–10.5)
nRBC: 0.3 % — ABNORMAL HIGH (ref 0.0–0.2)

## 2023-05-22 LAB — URINALYSIS, ROUTINE W REFLEX MICROSCOPIC
Bilirubin Urine: NEGATIVE
Glucose, UA: NEGATIVE mg/dL
Ketones, ur: 20 mg/dL — AB
Leukocytes,Ua: NEGATIVE
Nitrite: NEGATIVE
Protein, ur: NEGATIVE mg/dL
Specific Gravity, Urine: 1.013 (ref 1.005–1.030)
pH: 5 (ref 5.0–8.0)

## 2023-05-22 LAB — RETICULOCYTES
Immature Retic Fract: 23.6 % — ABNORMAL HIGH (ref 2.3–15.9)
RBC.: 3.41 MIL/uL — ABNORMAL LOW (ref 3.87–5.11)
Retic Count, Absolute: 92.8 10*3/uL (ref 19.0–186.0)
Retic Ct Pct: 2.7 % (ref 0.4–3.1)

## 2023-05-22 LAB — COMPREHENSIVE METABOLIC PANEL
ALT: 24 U/L (ref 0–44)
AST: 28 U/L (ref 15–41)
Albumin: 4.4 g/dL (ref 3.5–5.0)
Alkaline Phosphatase: 60 U/L (ref 38–126)
Anion gap: 8 (ref 5–15)
BUN: 11 mg/dL (ref 6–20)
CO2: 21 mmol/L — ABNORMAL LOW (ref 22–32)
Calcium: 9.4 mg/dL (ref 8.9–10.3)
Chloride: 109 mmol/L (ref 98–111)
Creatinine, Ser: 0.64 mg/dL (ref 0.44–1.00)
GFR, Estimated: >60 mL/min (ref >60)
Glucose, Bld: 86 mg/dL (ref 70–99)
Potassium: 3.3 mmol/L — ABNORMAL LOW (ref 3.5–5.1)
Sodium: 138 mmol/L (ref 135–145)
Total Bilirubin: 1.7 mg/dL — ABNORMAL HIGH (ref 0.3–1.2)
Total Protein: 8 g/dL (ref 6.5–8.1)

## 2023-05-22 LAB — TROPONIN I (HIGH SENSITIVITY)
Troponin I (High Sensitivity): 3 ng/L (ref <18)
Troponin I (High Sensitivity): 3 ng/L (ref <18)

## 2023-05-22 LAB — HCG, SERUM, QUALITATIVE: Preg, Serum: NEGATIVE

## 2023-05-22 LAB — LACTATE DEHYDROGENASE: LDH: 262 U/L — ABNORMAL HIGH (ref 98–192)

## 2023-05-22 MED ORDER — ENOXAPARIN SODIUM 40 MG/0.4ML IJ SOSY
40.0000 mg | PREFILLED_SYRINGE | INTRAMUSCULAR | Status: DC
  Filled 2023-05-22: qty 0.4

## 2023-05-22 MED ORDER — OXYCODONE HCL 10 MG PO TABS: 10.0000 mg | ORAL_TABLET | Freq: Four times a day (QID) | ORAL | Status: DC | PRN

## 2023-05-22 MED ORDER — HYDROMORPHONE 1 MG/ML IV SOLN
INTRAVENOUS | Status: DC
  Administered 2023-05-22: 5.4 mg via INTRAVENOUS
  Administered 2023-05-22: 30 mg via INTRAVENOUS
  Administered 2023-05-23: 6 mg via INTRAVENOUS
  Administered 2023-05-23: 3.6 mg via INTRAVENOUS
  Administered 2023-05-23: 3 mg via INTRAVENOUS
  Administered 2023-05-23: 3.6 mg via INTRAVENOUS
  Administered 2023-05-23: 30 mg via INTRAVENOUS
  Administered 2023-05-23: 2.33 mg via INTRAVENOUS
  Administered 2023-05-23: 4.2 mg via INTRAVENOUS
  Administered 2023-05-24: 4.8 mg via INTRAVENOUS
  Administered 2023-05-24: 30 mg via INTRAVENOUS
  Administered 2023-05-24: 3.6 mg via INTRAVENOUS
  Administered 2023-05-24: 6 mg via INTRAVENOUS
  Administered 2023-05-24: 8.4 mg via INTRAVENOUS
  Administered 2023-05-25 (×2): 4.8 mg via INTRAVENOUS
  Administered 2023-05-25: 5.4 mg via INTRAVENOUS
  Filled 2023-05-22 (×3): qty 30

## 2023-05-22 MED ORDER — POLYETHYLENE GLYCOL 3350 17 G PO PACK: 17.0000 g | PACK | Freq: Every day | ORAL | Status: DC | PRN

## 2023-05-22 MED ORDER — HYDROMORPHONE HCL 2 MG/ML IJ SOLN
2.0000 mg | Freq: Once | INTRAMUSCULAR | Status: AC
  Administered 2023-05-22: 2 mg via INTRAVENOUS
  Filled 2023-05-22: qty 1

## 2023-05-22 MED ORDER — SODIUM CHLORIDE 0.9% FLUSH: 9.0000 mL | INTRAVENOUS | Status: DC | PRN

## 2023-05-22 MED ORDER — SENNOSIDES-DOCUSATE SODIUM 8.6-50 MG PO TABS
1.0000 | ORAL_TABLET | Freq: Two times a day (BID) | ORAL | Status: DC
  Administered 2023-05-22 – 2023-05-24 (×4): 1 via ORAL

## 2023-05-22 MED ORDER — HYDROMORPHONE HCL 2 MG/ML IJ SOLN: 2.0000 mg | Freq: Once | INTRAMUSCULAR | Status: DC

## 2023-05-22 MED ORDER — ONDANSETRON HCL 4 MG/2ML IJ SOLN: 4.0000 mg | Freq: Once | INTRAMUSCULAR | Status: DC

## 2023-05-22 MED ORDER — ONDANSETRON HCL 4 MG/2ML IJ SOLN
4.0000 mg | Freq: Four times a day (QID) | INTRAMUSCULAR | Status: DC | PRN
  Administered 2023-05-23: 4 mg via INTRAVENOUS
  Filled 2023-05-22: qty 2

## 2023-05-22 MED ORDER — KETOROLAC TROMETHAMINE 15 MG/ML IJ SOLN
15.0000 mg | Freq: Once | INTRAMUSCULAR | Status: AC
  Administered 2023-05-22: 15 mg via INTRAVENOUS
  Filled 2023-05-22: qty 1

## 2023-05-22 MED ORDER — DULOXETINE HCL 30 MG PO CPEP: 60.0000 mg | ORAL_CAPSULE | Freq: Every day | ORAL | Status: DC

## 2023-05-22 MED ORDER — HYDROMORPHONE HCL 1 MG/ML IJ SOLN
1.0000 mg | Freq: Once | INTRAMUSCULAR | Status: AC
  Administered 2023-05-22: 1 mg via INTRAVENOUS
  Filled 2023-05-22: qty 1

## 2023-05-22 MED ORDER — OXYCODONE HCL 5 MG PO TABS
10.0000 mg | ORAL_TABLET | Freq: Four times a day (QID) | ORAL | Status: DC | PRN
  Administered 2023-05-22: 10 mg via ORAL
  Filled 2023-05-22: qty 2

## 2023-05-22 MED ORDER — FOLIC ACID 1 MG PO TABS
1.0000 mg | ORAL_TABLET | Freq: Every day | ORAL | Status: DC
  Administered 2023-05-22 – 2023-05-24 (×3): 1 mg via ORAL
  Filled 2023-05-22 (×3): qty 1

## 2023-05-22 MED ORDER — ONDANSETRON HCL 4 MG/2ML IJ SOLN
4.0000 mg | Freq: Once | INTRAMUSCULAR | Status: AC
  Administered 2023-05-22: 4 mg via INTRAVENOUS
  Filled 2023-05-22: qty 2

## 2023-05-22 MED ORDER — NALOXONE HCL 0.4 MG/ML IJ SOLN: 0.4000 mg | INTRAMUSCULAR | Status: DC | PRN

## 2023-05-22 MED ORDER — KETOROLAC TROMETHAMINE 15 MG/ML IJ SOLN
15.0000 mg | Freq: Four times a day (QID) | INTRAMUSCULAR | Status: DC
  Administered 2023-05-22 – 2023-05-25 (×11): 15 mg via INTRAVENOUS
  Filled 2023-05-22 (×11): qty 1

## 2023-05-22 MED ORDER — CYCLOBENZAPRINE HCL 10 MG PO TABS: 10.0000 mg | ORAL_TABLET | Freq: Every day | ORAL | Status: DC | PRN

## 2023-05-22 MED ORDER — MORPHINE SULFATE ER 15 MG PO TBCR
60.0000 mg | EXTENDED_RELEASE_TABLET | Freq: Two times a day (BID) | ORAL | Status: DC
  Administered 2023-05-22 – 2023-05-24 (×5): 60 mg via ORAL
  Filled 2023-05-22 (×5): qty 4

## 2023-05-22 MED ORDER — DIPHENHYDRAMINE HCL 50 MG/ML IJ SOLN
25.0000 mg | Freq: Once | INTRAMUSCULAR | Status: AC
  Administered 2023-05-22: 25 mg via INTRAVENOUS
  Filled 2023-05-22: qty 1

## 2023-05-22 MED ORDER — SODIUM CHLORIDE 0.45 % IV SOLN: INTRAVENOUS | Status: DC

## 2023-05-22 MED ORDER — DIPHENHYDRAMINE HCL 25 MG PO CAPS: 25.0000 mg | ORAL_CAPSULE | ORAL | Status: DC | PRN

## 2023-05-22 MED ORDER — SENNOSIDES-DOCUSATE SODIUM 8.6-50 MG PO TABS
ORAL_TABLET | ORAL | Status: AC
  Filled 2023-05-22: qty 1

## 2023-05-22 NOTE — ED Notes (Signed)
ED TO INPATIENT HANDOFF REPORT  ED Nurse Name and Phone #: Lona Kettle Name/Age/Gender Carmen [name] 32 y.o. female Room/Bed: WA13/WA13  Code Status   Code Status: Full Code  Home/SNF/Other Home Patient oriented to: self, place, time, and situation Is this baseline? Yes   Triage Complete: Triage complete  Chief Complaint Sickle cell anemia with pain (HCC) [D57.00]  Triage Note Pt. Arrives POV for [facility] x2 days. Pt. Endorses pain in her upper back and chest.   Allergies Allergies  Allergen Reactions   Ketamine Hives, Nausea And Vomiting and Other (See Comments)    Makes the patient "feel funny" also    Level of Care/Admitting Diagnosis ED Disposition     ED Disposition  Admit   Condition  --   Comment  Hospital [facility]: [facility] [[id number]]  Level of Care: Med-Surg [16]  May admit patient to [facility] or [facility] if equivalent level of care is available:: No  Covid Evaluation: Asymptomatic - no recent exposure (last 10 days) testing not required  Diagnosis: Sickle cell anemia with pain [facility]) [[id number]]  Admitting Physician: Quentin Angst [[id number]]  Attending Physician: Quentin Angst [[id number]]  Bed request comments: 6E  Certification:: I certify this patient will need inpatient services for at least 2 midnights  Estimated Length of Stay: 3          B Medical/Surgery History Past Medical History:  Diagnosis Date   Anxiety    Headache(784.0)    Heart murmur    Sickle cell crisis (HCC)    Syphilis 2015   Was diagnosed and received one injection of antibiotics   Thrombocytosis 11/22/2014     CBC Latest Ref Rng & Units 12/13/2018 12/11/2018 12/10/2018 WBC 4.0 - 10.5 K/uL 14.8(H) 13.2(H) 15.9(H) Hemoglobin 12.0 - 15.0 g/dL 8.2(L) 7.7(L) 8.1(L) Hematocrit 36.0 - 46.0 % 23.7(L) 23.2(L) 24.5(L) Platelets 150 - 400 K/uL 326 399 388     Past Surgical History:  Procedure Laterality Date   CESAREAN SECTION N/A 02/05/2019   Procedure:  CESAREAN SECTION;  Surgeon: Hermina Staggers, MD;  Location: [facility] LD ORS;  Service: Obstetrics;  Laterality: N/A;   CHOLECYSTECTOMY N/A 11/30/2014   Procedure: LAPAROSCOPIC CHOLECYSTECTOMY SINGLE SITE WITH INTRAOPERATIVE CHOLANGIOGRAM;  Surgeon: Karie Soda, MD;  Location: [facility] ORS;  Service: General;  Laterality: N/A;   SPLENECTOMY       A IV Location/Drains/Wounds Patient Lines/Drains/Airways Status     Active Line/Drains/Airways     Name Placement date Placement time Site Days   Peripheral IV 05/22/23 20 G Left Antecubital 05/22/23  0804  Antecubital  less than 1            Intake/Output Last 24 hours No intake or output data in the 24 hours ending 05/22/23 1400  Labs/Imaging Results for orders placed or performed during the hospital encounter of 05/22/23 (from the past 48 hour(s))  Comprehensive metabolic panel     Status: Abnormal   Collection Time: 05/22/23  8:00 AM  Result Value Ref Range   Sodium 138 135 - 145 mmol/L   Potassium 3.3 (L) 3.5 - 5.1 mmol/L   Chloride 109 98 - 111 mmol/L   CO2 21 (L) 22 - 32 mmol/L   Glucose, Bld 86 70 - 99 mg/dL    Comment: Glucose reference range applies only to samples taken after fasting for at least 8 hours.   BUN 11 6 - 20 mg/dL   Creatinine, Ser 8.41 0.44 - 1.00 mg/dL  Calcium 9.4 8.9 - 10.3 mg/dL   Total Protein 8.0 6.5 - 8.1 g/dL   Albumin 4.4 3.5 - 5.0 g/dL   AST 28 15 - 41 U/L   ALT 24 0 - 44 U/L   Alkaline Phosphatase 60 38 - 126 U/L   Total Bilirubin 1.7 (H) 0.3 - 1.2 mg/dL   GFR, Estimated >16 >10 mL/min    Comment: (NOTE) Calculated using the CKD-EPI Creatinine Equation (2021)    Anion gap 8 5 - 15    Comment: Performed at [facility], [address] [place], [place] [address]  CBC with Differential     Status: Abnormal   Collection Time: 05/22/23  8:00 AM  Result Value Ref Range   WBC 7.2 4.0 - 10.5 K/uL   RBC 3.39 (L) 3.87 - 5.11 MIL/uL   Hemoglobin 10.0 (L) 12.0 - 15.0 g/dL   HCT 40.9 (L)  81.1 - 46.0 %   MCV 82.3 80.0 - 100.0 fL   MCH 29.5 26.0 - 34.0 pg   MCHC 35.8 30.0 - 36.0 g/dL   RDW 91.4 78.2 - 95.6 %   Platelets 493 (H) 150 - 400 K/uL   nRBC 0.3 (H) 0.0 - 0.2 %   Neutrophils Relative % 51 %   Neutro Abs 3.7 1.7 - 7.7 K/uL   Lymphocytes Relative 34 %   Lymphs Abs 2.4 0.7 - 4.0 K/uL   Monocytes Relative 14 %   Monocytes Absolute 1.0 0.1 - 1.0 K/uL   Eosinophils Relative 1 %   Eosinophils Absolute 0.1 0.0 - 0.5 K/uL   Basophils Relative 0 %   Basophils Absolute 0.0 0.0 - 0.1 K/uL   Immature Granulocytes 0 %   Abs Immature Granulocytes 0.02 0.00 - 0.07 K/uL    Comment: Performed at [facility], [address] [place], [place] [address]  Reticulocytes     Status: Abnormal   Collection Time: 05/22/23  8:00 AM  Result Value Ref Range   Retic Ct Pct 2.7 0.4 - 3.1 %   RBC. 3.41 (L) 3.87 - 5.11 MIL/uL   Retic Count, Absolute 92.8 19.0 - 186.0 K/uL   Immature Retic Fract 23.6 (H) 2.3 - 15.9 %    Comment: Performed at [facility], [address] [place], [place] [address]  hCG, serum, qualitative     Status: None   Collection Time: 05/22/23  8:00 AM  Result Value Ref Range   Preg, Serum NEGATIVE NEGATIVE    Comment:        THE SENSITIVITY OF THIS METHODOLOGY IS >10 mIU/mL. Performed at [name], [address] [place], [place] [address]   Troponin I (High Sensitivity)     Status: None   Collection Time: 05/22/23  8:00 AM  Result Value Ref Range   Troponin I (High Sensitivity) 3 <18 ng/L    Comment: (NOTE) Elevated high sensitivity troponin I (hsTnI) values and significant  changes across serial measurements may suggest ACS but many other  chronic and acute conditions are known to elevate hsTnI results.  Refer to the "Links" section for chest pain algorithms and additional  guidance. Performed at [facility], [address] [place], [place] [address]   Troponin I (High  Sensitivity)     Status: None   Collection Time: 05/22/23 10:31 AM  Result Value Ref Range   Troponin I (High Sensitivity) 3 <18 ng/L    Comment: (NOTE) Elevated high sensitivity troponin  I (hsTnI) values and significant  changes across serial measurements may suggest ACS but many other  chronic and acute conditions are known to elevate hsTnI results.  Refer to the "Links" section for chest pain algorithms and additional  guidance. Performed at [facility], [address] [place], [place] [address]    DG Chest 2 View  Result Date: 05/22/2023 CLINICAL DATA:  Sickle cell crisis. EXAM: CHEST - 2 VIEW COMPARISON:  09/12/2022 FINDINGS: The lungs are clear without focal pneumonia, edema, pneumothorax or pleural effusion. The cardiopericardial silhouette is within normal limits for size. Possible tiny right pleural effusion. No acute bony abnormality. Telemetry leads overlie the chest. IMPRESSION: Possible tiny right pleural effusion. Otherwise no acute cardiopulmonary findings. Electronically Signed   By: Kennith Center M.D.   On: 05/22/2023 07:33    Pending Labs [employer] (From admission, onward)     Start     Ordered   Signed and Held  Lactate dehydrogenase  Once,   R        Signed and Held   Signed and Held  CBC  Daily,   R      Signed and Held   Signed and Held  Urinalysis, Routine w reflex microscopic -Urine, Clean Catch  Once,   R       Question:  Specimen Source  Answer:  Urine, Clean Catch   Signed and Held   Signed and Held  Basic metabolic panel  Daily,   R      Signed and Held            Vitals/Pain Today's Vitals   05/22/23 0930 05/22/23 1030 05/22/23 1230 05/22/23 1243  BP: 124/72  126/70 122/75  Pulse: 63  63 95  Resp: 18  17 13   Temp:  98.7 F (37.1 C)  98.6 F (37 C)  TempSrc:  Oral  Oral  SpO2: 99%  99% 96%  Weight:      Height:      PainSc:        Isolation Precautions No active isolations  Medications Medications   ondansetron (ZOFRAN) injection 4 mg (0 mg Intravenous Hold 05/22/23 1224)  HYDROmorphone (DILAUDID) injection 1 mg (1 mg Intravenous Given 05/22/23 0805)  ketorolac (TORADOL) 15 MG/ML injection 15 mg (15 mg Intravenous Given 05/22/23 0804)  ondansetron (ZOFRAN) injection 4 mg (4 mg Intravenous Given 05/22/23 0805)  diphenhydrAMINE (BENADRYL) injection 25 mg (25 mg Intravenous Given 05/22/23 0805)  HYDROmorphone (DILAUDID) injection 2 mg (2 mg Intravenous Given 05/22/23 1030)  ondansetron (ZOFRAN) injection 4 mg (4 mg Intravenous Given 05/22/23 1030)  HYDROmorphone (DILAUDID) injection 2 mg (2 mg Intravenous Given 05/22/23 1222)    Mobility walks     Focused Assessments [place] crisis   R Recommendations: See Admitting Provider Note  Report given to:   Additional Notes: .

## 2023-05-22 NOTE — Progress Notes (Signed)
       Overnight   NAME: MALAIA BUCHTA MRN: [id number] DOB : 03/24/1991    Date of Service   05/22/2023   HPI/Events of Note    Notified by RN for patient complaint of "feel flushed when IV running too fast"   Interventions/ Plan   Reduced to 75cc/hr      Chinita Greenland BSN MSNA MSN ACNPC-AG Acute Care Nurse Practitioner [facility]
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
## 2023-05-22 NOTE — ED Triage Notes (Signed)
Pt. Arrives POV for [facility] x2 days. Pt. Endorses pain in her upper back and chest.

## 2023-05-23 DIAGNOSIS — D5700 Hb-SS disease with crisis, unspecified: Secondary | ICD-10-CM | POA: Diagnosis not present

## 2023-05-23 LAB — BASIC METABOLIC PANEL
Anion gap: 9 (ref 5–15)
BUN: 11 mg/dL (ref 6–20)
CO2: 21 mmol/L — ABNORMAL LOW (ref 22–32)
Calcium: 9 mg/dL (ref 8.9–10.3)
Chloride: 104 mmol/L (ref 98–111)
Creatinine, Ser: 0.6 mg/dL (ref 0.44–1.00)
GFR, Estimated: >60 mL/min (ref >60)
Glucose, Bld: 98 mg/dL (ref 70–99)
Potassium: 3.5 mmol/L (ref 3.5–5.1)
Sodium: 134 mmol/L — ABNORMAL LOW (ref 135–145)

## 2023-05-23 LAB — CBC
HCT: 26.8 % — ABNORMAL LOW (ref 36.0–46.0)
Hemoglobin: 9.3 g/dL — ABNORMAL LOW (ref 12.0–15.0)
MCH: 28.9 pg (ref 26.0–34.0)
MCHC: 34.7 g/dL (ref 30.0–36.0)
MCV: 83.2 fL (ref 80.0–100.0)
Platelets: 435 10*3/uL — ABNORMAL HIGH (ref 150–400)
RBC: 3.22 MIL/uL — ABNORMAL LOW (ref 3.87–5.11)
RDW: 13.3 % (ref 11.5–15.5)
WBC: 10.3 10*3/uL (ref 4.0–10.5)
nRBC: 0.2 % (ref 0.0–0.2)

## 2023-05-23 MED ORDER — SODIUM CHLORIDE 0.9 % IV SOLN
12.5000 mg | Freq: Four times a day (QID) | INTRAVENOUS | Status: DC | PRN
  Administered 2023-05-24: 12.5 mg via INTRAVENOUS
  Filled 2023-05-23: qty 12.5

## 2023-05-23 MED ORDER — SODIUM CHLORIDE 0.9 % IV SOLN
25.0000 mg | Freq: Once | INTRAVENOUS | Status: AC
  Administered 2023-05-23: 25 mg via INTRAVENOUS
  Filled 2023-05-23: qty 25
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
## 2023-05-23 NOTE — Progress Notes (Signed)
Subjective: Carmen [name] is a 32 year old female with a medical history significant for sickle cell disease, chronic pain syndrome, major depressive disorder, opiate dependence and tolerance, and anemia of chronic disease was admitted for sickle cell pain crisis. Patient continues to complain of pain mostly to low back and lower extremities.  She rates pain as 8/10.  She endorses nausea today.  She denies headache, dizziness, chest pain, shortness of breath, urinary symptoms, or diarrhea.  Objective:  Vital signs in last 24 hours:  Vitals:   05/23/23 0749 05/23/23 1036 05/23/23 1237 05/23/23 1425  BP:  (!) 120/53  124/68  Pulse:  66  67  Resp: 20 20 20 20   Temp:  97.6 F (36.4 C)  98.6 F (37 C)  TempSrc:  Oral  Oral  SpO2: 97% 100% 100% 100%  Weight:      Height:        Intake/Output from previous day:   Intake/Output Summary (Last 24 hours) at 05/23/2023 1628 Last data filed at 05/23/2023 1500 Gross per 24 hour  Intake 1410.97 ml  Output --  Net 1410.97 ml    Physical Exam: General: Alert, awake, oriented x3, in no acute distress.  HEENT: [place]/AT PEERL, EOMI Neck: Trachea midline,  no masses, no thyromegal,y no JVD, no carotid bruit OROPHARYNX:  Moist, No exudate/ erythema/lesions.  Heart: Regular rate and rhythm, without murmurs, rubs, gallops, PMI non-displaced, no heaves or thrills on palpation.  Lungs: Clear to auscultation, no wheezing or rhonchi noted. No increased vocal fremitus resonant to percussion  Abdomen: Soft, nontender, nondistended, positive bowel sounds, no masses no hepatosplenomegaly noted..  Neuro: No focal neurological deficits noted cranial nerves II through XII grossly intact. DTRs 2+ bilaterally upper and lower extremities. Strength 5 out of 5 in bilateral upper and lower extremities. Musculoskeletal: No warm swelling or erythema around joints, no spinal tenderness noted. Psychiatric: Patient alert and oriented x3, good insight and cognition, good  recent to remote recall. Lymph node survey: No cervical axillary or inguinal lymphadenopathy noted.  Lab Results:  Basic Metabolic Panel:    Component Value Date/Time   NA 134 (L) 05/23/2023 0518   NA 142 08/24/2022 1213   K 3.5 05/23/2023 0518   CL 104 05/23/2023 0518   CO2 21 (L) 05/23/2023 0518   BUN 11 05/23/2023 0518   BUN 10 08/24/2022 1213   CREATININE 0.60 05/23/2023 0518   GLUCOSE 98 05/23/2023 0518   CALCIUM 9.0 05/23/2023 0518   CBC:    Component Value Date/Time   WBC 10.3 05/23/2023 0518   HGB 9.3 (L) 05/23/2023 0518   HGB 9.7 (L) 08/24/2022 1213   HCT 26.8 (L) 05/23/2023 0518   HCT 30.1 (L) 08/24/2022 1213   PLT 435 (H) 05/23/2023 0518   PLT 477 (H) 08/24/2022 1213   MCV 83.2 05/23/2023 0518   MCV 88 08/24/2022 1213   NEUTROABS 3.7 05/22/2023 0800   NEUTROABS 3.3 08/24/2022 1213   LYMPHSABS 2.4 05/22/2023 0800   LYMPHSABS 2.8 08/24/2022 1213   MONOABS 1.0 05/22/2023 0800   EOSABS 0.1 05/22/2023 0800   EOSABS 0.2 08/24/2022 1213   BASOSABS 0.0 05/22/2023 0800   BASOSABS 0.1 08/24/2022 1213    No results found for this or any previous visit (from the past 240 hour(s)).  Studies/Results: DG Chest 2 View  Result Date: 05/22/2023 CLINICAL DATA:  Sickle cell crisis. EXAM: CHEST - 2 VIEW COMPARISON:  09/12/2022 FINDINGS: The lungs are clear without focal pneumonia, edema, pneumothorax or pleural effusion. The cardiopericardial  silhouette is within normal limits for size. Possible tiny right pleural effusion. No acute bony abnormality. Telemetry leads overlie the chest. IMPRESSION: Possible tiny right pleural effusion. Otherwise no acute cardiopulmonary findings. Electronically Signed   By: Kennith Center M.D.   On: 05/22/2023 07:33    Medications: Scheduled Meds:  enoxaparin (LOVENOX) injection  40 mg Subcutaneous Q24H   folic acid  1 mg Oral Daily   HYDROmorphone   Intravenous Q4H   ketorolac  15 mg Intravenous Q6H   morphine  60 mg Oral Q12H    senna-docusate  1 tablet Oral BID   Continuous Infusions:  sodium chloride 10 mL/hr at 05/23/23 1002   promethazine (PHENERGAN) injection (IM or IVPB)     PRN Meds:.cyclobenzaprine, diphenhydrAMINE, naloxone **AND** sodium chloride flush, oxyCODONE, polyethylene glycol, promethazine (PHENERGAN) injection (IM or IVPB)  Consultants: none  Procedures: none  Antibiotics: none  Assessment/Plan: Principal Problem:   Sickle cell anemia with pain (HCC) Active Problems:   Anemia of chronic disease   Hypokalemia   Tobacco abuse   Moderate episode of recurrent major depressive disorder (HCC)   Chronic pain syndrome  Sickle cell disease with pain crisis: Continue IV Dilaudid PCA without any changes Continue home medications Decrease IV fluids to [facility] Monitor vital signs very closely, reevaluate pain scale regularly, and supplemental oxygen as needed.  Chronic pain syndrome: Continue home medications  Anemia of chronic disease: Hemoglobin is stable and consistent with patient's baseline.  No need for blood transfusion at this time.  Monitor closely.  Labs in AM.  Major depressive disorder: Stable.  Continue Cymbalta.  No suicidal or homicidal intentions today.  Follow closely.  Hypokalemia: Resolved.  Continue to monitor closely.  Tobacco use disorder: Patient counseled extensively on admission.  Advised against the dangers of smoking especially in the setting of sickle cell disease.  Code Status: Full Code Family Communication: N/A Disposition Plan: Not yet ready for discharge   Fern Canova Rennis Petty  APRN, MSN, FNP-C [facility] [address] [place], [place] [address] [phone]  If 7PM-7AM, please contact night-coverage.  05/23/2023, 4:28 PM  LOS: 1 day

## 2023-05-24 DIAGNOSIS — D5700 Hb-SS disease with crisis, unspecified: Principal | ICD-10-CM

## 2023-05-24 LAB — BASIC METABOLIC PANEL
Anion gap: 10 (ref 5–15)
BUN: 14 mg/dL (ref 6–20)
CO2: 21 mmol/L — ABNORMAL LOW (ref 22–32)
Calcium: 9.2 mg/dL (ref 8.9–10.3)
Chloride: 107 mmol/L (ref 98–111)
Creatinine, Ser: 0.52 mg/dL (ref 0.44–1.00)
GFR, Estimated: >60 mL/min (ref >60)
Glucose, Bld: 90 mg/dL (ref 70–99)
Potassium: 4.5 mmol/L (ref 3.5–5.1)
Sodium: 138 mmol/L (ref 135–145)

## 2023-05-24 LAB — CBC
HCT: 25.8 % — ABNORMAL LOW (ref 36.0–46.0)
Hemoglobin: 9.2 g/dL — ABNORMAL LOW (ref 12.0–15.0)
MCH: 29.6 pg (ref 26.0–34.0)
MCHC: 35.7 g/dL (ref 30.0–36.0)
MCV: 83 fL (ref 80.0–100.0)
Platelets: 367 10*3/uL (ref 150–400)
RBC: 3.11 MIL/uL — ABNORMAL LOW (ref 3.87–5.11)
RDW: 14.6 % (ref 11.5–15.5)
WBC: 13.9 10*3/uL — ABNORMAL HIGH (ref 4.0–10.5)
nRBC: 0.1 % (ref 0.0–0.2)

## 2023-05-24 NOTE — Progress Notes (Signed)
Subjective: Carmen [name] is a 32 year old female with a medical history significant for sickle cell disease, chronic pain syndrome, major depressive disorder, opiate dependence and tolerance, and anemia of chronic disease was admitted for sickle cell pain crisis. Patient does not have any new complaints on today.  She states the pain intensity has decreased some overnight.  Patient rates pain as 7/10.  She states that she cannot manage at home at this time.  She denies headache, dizziness, chest pain, shortness of breath, urinary symptoms, or diarrhea.  Objective:  Vital signs in last 24 hours:  Vitals:   05/24/23 0024 05/24/23 0440 05/24/23 0745 05/24/23 1055  BP: 121/87 121/78  113/76  Pulse: 95 74  79  Resp: 14 14 16 16   Temp: 98.3 F (36.8 C) (!) 97.5 F (36.4 C)  98.2 F (36.8 C)  TempSrc: Oral Oral  Oral  SpO2: 97% 98% 97% 97%  Weight:      Height:        Intake/Output from previous day:   Intake/Output Summary (Last 24 hours) at 05/24/2023 1106 Last data filed at 05/23/2023 1824 Gross per 24 hour  Intake 1410.97 ml  Output --  Net 1410.97 ml    Physical Exam: General: Alert, awake, oriented x3, in no acute distress.  HEENT: [place]/AT PEERL, EOMI Neck: Trachea midline,  no masses, no thyromegal,y no JVD, no carotid bruit OROPHARYNX:  Moist, No exudate/ erythema/lesions.  Heart: Regular rate and rhythm, without murmurs, rubs, gallops, PMI non-displaced, no heaves or thrills on palpation.  Lungs: Clear to auscultation, no wheezing or rhonchi noted. No increased vocal fremitus resonant to percussion  Abdomen: Soft, nontender, nondistended, positive bowel sounds, no masses no hepatosplenomegaly noted..  Neuro: No focal neurological deficits noted cranial nerves II through XII grossly intact. DTRs 2+ bilaterally upper and lower extremities. Strength 5 out of 5 in bilateral upper and lower extremities. Musculoskeletal: No warm swelling or erythema around joints, no spinal  tenderness noted. Psychiatric: Patient alert and oriented x3, good insight and cognition, good recent to remote recall. Lymph node survey: No cervical axillary or inguinal lymphadenopathy noted.  Lab Results:  Basic Metabolic Panel:    Component Value Date/Time   NA 138 05/24/2023 0621   NA 142 08/24/2022 1213   K 4.5 05/24/2023 0621   CL 107 05/24/2023 0621   CO2 21 (L) 05/24/2023 0621   BUN 14 05/24/2023 0621   BUN 10 08/24/2022 1213   CREATININE 0.52 05/24/2023 0621   GLUCOSE 90 05/24/2023 0621   CALCIUM 9.2 05/24/2023 0621   CBC:    Component Value Date/Time   WBC 13.9 (H) 05/24/2023 0621   HGB 9.2 (L) 05/24/2023 0621   HGB 9.7 (L) 08/24/2022 1213   HCT 25.8 (L) 05/24/2023 0621   HCT 30.1 (L) 08/24/2022 1213   PLT 367 05/24/2023 0621   PLT 477 (H) 08/24/2022 1213   MCV 83.0 05/24/2023 0621   MCV 88 08/24/2022 1213   NEUTROABS 3.7 05/22/2023 0800   NEUTROABS 3.3 08/24/2022 1213   LYMPHSABS 2.4 05/22/2023 0800   LYMPHSABS 2.8 08/24/2022 1213   MONOABS 1.0 05/22/2023 0800   EOSABS 0.1 05/22/2023 0800   EOSABS 0.2 08/24/2022 1213   BASOSABS 0.0 05/22/2023 0800   BASOSABS 0.1 08/24/2022 1213    No results found for this or any previous visit (from the past 240 hour(s)).  Studies/Results: No results found.  Medications: Scheduled Meds:  enoxaparin (LOVENOX) injection  40 mg Subcutaneous Q24H   folic acid  1  mg Oral Daily   HYDROmorphone   Intravenous Q4H   ketorolac  15 mg Intravenous Q6H   morphine  60 mg Oral Q12H   senna-docusate  1 tablet Oral BID   Continuous Infusions:  sodium chloride 10 mL/hr at 05/23/23 1002   promethazine (PHENERGAN) injection (IM or IVPB)     PRN Meds:.cyclobenzaprine, diphenhydrAMINE, naloxone **AND** sodium chloride flush, oxyCODONE, polyethylene glycol, promethazine (PHENERGAN) injection (IM or IVPB)  Consultants: none  Procedures: none  Antibiotics: none  Assessment/Plan: Principal Problem:   Sickle cell anemia  with pain (HCC) Active Problems:   Anemia of chronic disease   Hypokalemia   Tobacco abuse   Moderate episode of recurrent major depressive disorder (HCC)   Chronic pain syndrome  Sickle cell disease with pain crisis: Continue IV Dilaudid PCA without any changes Continue home medications Monitor vital signs very closely, reevaluate pain scale regularly, and supplemental oxygen as needed.  Chronic pain syndrome: Continue home medications  Anemia of chronic disease: Hemoglobin is stable and consistent with patient's baseline.  No need for blood transfusion at this time.  Monitor closely.  Labs in AM.  Major depressive disorder: Stable.  Continue Cymbalta.  No suicidal or homicidal intentions today.  Follow closely.  Hypokalemia: Resolved.  Continue to monitor closely.  Tobacco use disorder: Patient counseled extensively on admission.  Advised against the dangers of smoking especially in the setting of sickle cell disease.  Code Status: Full Code Family Communication: N/A Disposition Plan: Not yet ready for discharge.  Discharge planned for 05/25/2023.   Nolon Nations  APRN, MSN, FNP-C Patient Care [facility] [address] [place], [place] [address] [phone]  If 7PM-7AM, please contact night-coverage.  05/24/2023, 11:06 AM  LOS: 2 days

## 2023-05-25 DIAGNOSIS — D5700 Hb-SS disease with crisis, unspecified: Principal | ICD-10-CM

## 2023-05-25 LAB — BASIC METABOLIC PANEL WITH GFR
Anion gap: 12 (ref 5–15)
BUN: 19 mg/dL (ref 6–20)
CO2: 21 mmol/L — ABNORMAL LOW (ref 22–32)
Calcium: 9.4 mg/dL (ref 8.9–10.3)
Chloride: 104 mmol/L (ref 98–111)
Creatinine, Ser: 0.49 mg/dL (ref 0.44–1.00)
GFR, Estimated: >60 mL/min
Glucose, Bld: 103 mg/dL — ABNORMAL HIGH (ref 70–99)
Potassium: 3.9 mmol/L (ref 3.5–5.1)
Sodium: 137 mmol/L (ref 135–145)

## 2023-05-25 MED ORDER — HEPARIN SOD (PORK) LOCK FLUSH 100 UNIT/ML IV SOLN: 500.0000 [IU] | INTRAVENOUS | Status: DC | PRN
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
## 2023-05-26 ENCOUNTER — Telehealth: Payer: Self-pay

## 2023-05-26 NOTE — Transitions of Care (Post Inpatient/ED Visit) (Signed)
   05/26/2023  Name: Carmen [name] MRN: [id number] DOB: 06-25-1991  Today's TOC FU Call Status: Today's TOC FU Call Status:: Unsuccessul Call (1st Attempt) Unsuccessful Call (1st Attempt) Date: 05/26/23  Attempted to reach the patient regarding the most recent Inpatient/ED visit.  Follow Up Plan: Additional outreach attempts will be made to reach the patient to complete the Transitions of Care (Post Inpatient/ED visit) call.   Signature   Woodfin Ganja LPN [facility] Nurse Health Advisor Direct Dial [phone]
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
## 2023-05-30 ENCOUNTER — Other Ambulatory Visit: Payer: Self-pay

## 2023-05-30 ENCOUNTER — Other Ambulatory Visit: Payer: Self-pay | Admitting: Nurse Practitioner

## 2023-05-30 DIAGNOSIS — Z79891 Long term (current) use of opiate analgesic: Secondary | ICD-10-CM

## 2023-05-30 DIAGNOSIS — D571 Sickle-cell disease without crisis: Secondary | ICD-10-CM

## 2023-05-30 MED ORDER — OXYCODONE HCL 10 MG PO TABS
10.0000 mg | ORAL_TABLET | Freq: Four times a day (QID) | ORAL | 0 refills | Status: DC | PRN
Start: 2023-05-30 — End: 2023-06-14

## 2023-05-30 NOTE — Transitions of Care (Post Inpatient/ED Visit) (Signed)
   05/30/2023  Name: Carmen [name] MRN: [id number] DOB: 02/16/1991  Today's TOC FU Call Status: Today's TOC FU Call Status:: Unsuccessful Call (2nd Attempt) Unsuccessful Call (1st Attempt) Date: 05/26/23 Unsuccessful Call (2nd Attempt) Date: 05/30/23  Attempted to reach the patient regarding the most recent Inpatient/ED visit.  Follow Up Plan: Additional outreach attempts will be made to reach the patient to complete the Transitions of Care (Post Inpatient/ED visit) call.   Signature   Woodfin Ganja LPN [facility] Nurse Health Advisor Direct Dial [phone]

## 2023-05-30 NOTE — Telephone Encounter (Signed)
Please advise KH 

## 2023-05-31 NOTE — Transitions of Care (Post Inpatient/ED Visit) (Signed)
   05/31/2023  Name: Carmen [name] MRN: [id number] DOB: December 24, 1990  Today's TOC FU Call Status: Today's TOC FU Call Status:: Unsuccessful Call (3rd Attempt) Unsuccessful Call (1st Attempt) Date: 05/26/23 Unsuccessful Call (2nd Attempt) Date: 05/30/23 Unsuccessful Call (3rd Attempt) Date: 05/31/23 [facility] FU Call Complete Date: 05/31/23  Attempted to reach the patient regarding the most recent Inpatient/ED visit.  Follow Up Plan: No further outreach attempts will be made at this time. We have been unable to contact the patient.  Signature  Woodfin Ganja LPN [facility] Nurse Health Advisor Direct Dial [phone]

## 2023-06-01 ENCOUNTER — Other Ambulatory Visit: Payer: Self-pay | Admitting: Family Medicine

## 2023-06-14 ENCOUNTER — Ambulatory Visit: Payer: MEDICAID | Admitting: Family Medicine

## 2023-06-14 ENCOUNTER — Encounter: Payer: Self-pay | Admitting: Family Medicine

## 2023-06-14 VITALS — BP 119/65 | HR 79 | Resp 16 | Wt 195.0 lb

## 2023-06-14 DIAGNOSIS — Z79891 Long term (current) use of opiate analgesic: Secondary | ICD-10-CM

## 2023-06-14 DIAGNOSIS — M545 Low back pain, unspecified: Secondary | ICD-10-CM | POA: Diagnosis not present

## 2023-06-14 DIAGNOSIS — E559 Vitamin D deficiency, unspecified: Secondary | ICD-10-CM

## 2023-06-14 DIAGNOSIS — G894 Chronic pain syndrome: Secondary | ICD-10-CM

## 2023-06-14 DIAGNOSIS — D571 Sickle-cell disease without crisis: Secondary | ICD-10-CM

## 2023-06-14 MED ORDER — CYCLOBENZAPRINE HCL 10 MG PO TABS
10.0000 mg | ORAL_TABLET | Freq: Every day | ORAL | 2 refills | Status: DC
Start: 2023-06-14 — End: 2024-01-16

## 2023-06-14 MED ORDER — OXYCODONE HCL 10 MG PO TABS
10.0000 mg | ORAL_TABLET | Freq: Four times a day (QID) | ORAL | 0 refills | Status: DC | PRN
Start: 2023-06-14 — End: 2023-06-29

## 2023-06-14 NOTE — Progress Notes (Signed)
Established Patient Office Visit  Subjective   Patient ID: Carmen [name], female    DOB: July 27, 1991  Age: 32 y.o. MRN: [id number]  Chief Complaint  Patient presents with   Sickle Cell Anemia    Carmen [name] is a very pleasant 32 year old female with a medical history significant for sickle cell disease, chronic pain syndrome, opiate dependence and tolerance, and history of anemia of chronic disease presents for a follow-up of her chronic conditions.  Basma states that she has been doing well and does not have any complaints on today. Patient has a history of chronic pain that is controlled with MS Contin and oxycodone.  Medications were last taken on last night with moderate relief.  Patient rates her pain a 6/10 primarily to her low back and lower extremities.  Patient was recently hospitalized for sickle cell pain crisis.  Patient states that pain intensity improved following admission.  Today, patient denies any shortness of breath, chest pain, urinary symptoms, nausea, vomiting, or diarrhea.    Patient Active Problem List   Diagnosis Date Noted   Hospital discharge follow-up 03/09/2023   Nausea & vomiting 03/03/2022   Acute chest syndrome due to sickle cell crisis (HCC) 09/05/2021   Chronic pain syndrome 08/21/2021   Moderate episode of recurrent major depressive disorder (HCC) 05/28/2021   Major depressive disorder 05/22/2021   Tobacco abuse 05/21/2021   Sickle cell disease with crisis (HCC) 03/23/2021   Acute pain of right shoulder    Sickle cell anemia with crisis (HCC) 02/16/2021   Community acquired pneumonia of right lower lobe of lung    Fever of unknown origin 10/14/2020   Sickle cell crisis (HCC) 03/31/2020   Bacterial vaginitis 01/04/2020   Acute cystitis without hematuria    Dysuria 12/31/2019   Urine leukocytes    Sickle cell pain crisis (HCC) 09/10/2019   Sickle cell disease (HCC) 12/04/2018   Alpha thalassemia silent carrier 12/04/2018   Chronic prescription  opiate use 03/13/2018   Chronic musculoskeletal pain 03/13/2018   Vitamin D deficiency 01/13/2018   Leukocytosis 08/21/2017   Hypokalemia 06/27/2017   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) 04/05/2017   Constipation due to pain medication    Hb-SS disease without crisis (HCC) 08/15/2016   Anemia of chronic disease    Chest pain 11/10/2015   Sickle cell anemia with pain (HCC) 02/16/2015   Thrombocytosis 11/22/2014   Systolic ejection murmur 09/11/2014   Sickle cell anemia of mother during pregnancy (HCC) 05/16/2014   Past Medical History:  Diagnosis Date   Anxiety    Headache(784.0)    Heart murmur    Sickle cell crisis (HCC)    Syphilis 2015   Was diagnosed and received one injection of antibiotics   Thrombocytosis 11/22/2014     CBC Latest Ref Rng & Units 12/13/2018 12/11/2018 12/10/2018 WBC 4.0 - 10.5 K/uL 14.8(H) 13.2(H) 15.9(H) Hemoglobin 12.0 - 15.0 g/dL 8.2(L) 7.7(L) 8.1(L) Hematocrit 36.0 - 46.0 % 23.7(L) 23.2(L) 24.5(L) Platelets 150 - 400 K/uL 326 399 388     Past Surgical History:  Procedure Laterality Date   CESAREAN SECTION N/A 02/05/2019   Procedure: CESAREAN SECTION;  Surgeon: Hermina Staggers, MD;  Location: [facility];  Service: Obstetrics;  Laterality: N/A;   CHOLECYSTECTOMY N/A 11/30/2014   Procedure: LAPAROSCOPIC CHOLECYSTECTOMY SINGLE SITE WITH INTRAOPERATIVE CHOLANGIOGRAM;  Surgeon: Karie Soda, MD;  Location: [facility] ORS;  Service: General;  Laterality: N/A;   SPLENECTOMY     Social History   Tobacco  Use   Smoking status: Some Days    Types: Cigarettes   Smokeless tobacco: Never  Vaping Use   Vaping status: Never Used  Substance Use Topics   Alcohol use: No   Drug use: No   Social History   Socioeconomic History   Marital status: Single    Spouse name: Not on file   Number of children: Not on file   Years of education: Not on file   Highest education level: 12th grade  Occupational History   Not on file  Tobacco Use   Smoking status: Some Days     Types: Cigarettes   Smokeless tobacco: Never  Vaping Use   Vaping status: Never Used  Substance and Sexual Activity   Alcohol use: No   Drug use: No   Sexual activity: Yes    Birth control/protection: Injection  Other Topics Concern   Not on file  Social History Narrative   Not on file   Social Determinants of Health   Financial Resource Strain: Low Risk  (03/09/2023)   Overall Financial Resource Strain (CARDIA)    Difficulty of Paying Living Expenses: Not hard at all  Food Insecurity: No Food Insecurity (05/22/2023)   Hunger Vital Sign    Worried About Running Out of Food in the Last Year: Never true    Ran Out of Food in the Last Year: Never true  Transportation Needs: No Transportation Needs (05/22/2023)   PRAPARE - Administrator, Civil Service (Medical): No    Lack of Transportation (Non-Medical): No  Recent Concern: Transportation Needs - Unmet Transportation Needs (03/21/2023)   PRAPARE - Administrator, Civil Service (Medical): Yes    Lack of Transportation (Non-Medical): No  Physical Activity: Insufficiently Active (03/09/2023)   Exercise Vital Sign    Days of Exercise per Week: 2 days    Minutes of Exercise per Session: 20 min  Stress: No Stress Concern Present (03/09/2023)   [employer] of Occupational Health - Occupational Stress Questionnaire    Feeling of Stress : Not at all  Social Connections: Moderately Isolated (03/09/2023)   Social Connection and Isolation Panel [NHANES]    Frequency of Communication with Friends and Family: More than three times a week    Frequency of Social Gatherings with Friends and Family: More than three times a week    Attends Religious Services: 1 to 4 times per year    Active Member of [employer] or Organizations: No    Attends Engineer, structural: Not on file    Marital Status: Never married  Intimate Partner Violence: Not At Risk (05/22/2023)   Humiliation, Afraid, Rape, and Kick questionnaire     Fear of Current or Ex-Partner: No    Emotionally Abused: No    Physically Abused: No    Sexually Abused: No   Family Status  Relation Name Status   Mother  Alive   Father  Alive   Sister  (Not Specified)   Sister  (Not Specified)   Sister  (Not Specified)   Mat Aunt  (Not Specified)   Mat Uncle  (Not Specified)   Sister  (Not Specified)  No partnership data on file   Family History  Problem Relation Age of Onset   Hypertension Mother    Sickle cell anemia Sister    Kidney disease Sister        Lupus   Arthritis Sister    Sickle cell anemia Sister    Sickle  cell trait Sister    Heart disease Maternal Aunt        CABG   Heart disease Maternal Uncle        CABG   Lupus Sister    Allergies  Allergen Reactions   Ketamine Hives, Nausea And Vomiting and Other (See Comments)    Makes the patient "feel funny" also      Review of Systems  Constitutional:  Negative for chills and fever.  HENT: Negative.    Eyes:  Negative for double vision.  Respiratory: Negative.    Cardiovascular: Negative.   Gastrointestinal: Negative.   Genitourinary: Negative.   Musculoskeletal: Negative.   Neurological: Negative.   Psychiatric/Behavioral: Negative.        Objective:     BP 119/65 (BP Location: Right Arm, Patient Position: Sitting, Cuff Size: Normal)   Pulse 79   Resp 16   Wt 195 lb (88.5 kg)   LMP 05/18/2023   SpO2 100%   BMI 34.54 kg/m  BP Readings from Last 3 Encounters:  06/14/23 119/65  05/25/23 110/64  04/23/23 (!) 149/81   Wt Readings from Last 3 Encounters:  06/14/23 195 lb (88.5 kg)  05/22/23 193 lb (87.5 kg)  04/21/23 190 lb 9.6 oz (86.5 kg)      Physical Exam Constitutional:      Appearance: Normal appearance. She is obese.  Eyes:     Conjunctiva/sclera: Conjunctivae normal.     Pupils: Pupils are equal, round, and reactive to light.  Cardiovascular:     Rate and Rhythm: Normal rate and regular rhythm.     Pulses: Normal pulses.  Pulmonary:      Effort: Pulmonary effort is normal.  Abdominal:     General: Bowel sounds are normal.  Skin:    General: Skin is warm.  Neurological:     General: No focal deficit present.     Mental Status: She is alert. Mental status is at baseline.  Psychiatric:        Mood and Affect: Mood normal.        Behavior: Behavior normal.        Thought Content: Thought content normal.        Judgment: Judgment normal.      No results found for any visits on 06/14/23.  Last CBC Lab Results  Component Value Date   WBC 13.9 (H) 05/24/2023   HGB 9.2 (L) 05/24/2023   HCT 25.8 (L) 05/24/2023   MCV 83.0 05/24/2023   MCH 29.6 05/24/2023   RDW 14.6 05/24/2023   PLT 367 05/24/2023   Last metabolic panel Lab Results  Component Value Date   GLUCOSE 103 (H) 05/25/2023   NA 137 05/25/2023   K 3.9 05/25/2023   CL 104 05/25/2023   CO2 21 (L) 05/25/2023   BUN 19 05/25/2023   CREATININE 0.49 05/25/2023   GFRNONAA >60 05/25/2023   CALCIUM 9.4 05/25/2023   PHOS 3.6 05/22/2021   PROT 8.0 05/22/2023   ALBUMIN 4.4 05/22/2023   LABGLOB 3.9 08/24/2022   AGRATIO 1.2 08/24/2022   BILITOT 1.7 (H) 05/22/2023   ALKPHOS 60 05/22/2023   AST 28 05/22/2023   ALT 24 05/22/2023   ANIONGAP 12 05/25/2023   Last lipids No results found for: "CHOL", "HDL", "LDLCALC", "LDLDIRECT", "TRIG", "CHOLHDL" Last hemoglobin A1c No results found for: "HGBA1C" Last thyroid functions No results found for: "TSH", "T3TOTAL", "T4TOTAL", "THYROIDAB" Last vitamin D Lab Results  Component Value Date   VD25OH 23.1 (L) 08/24/2022  Last vitamin B12 and Folate Lab Results  Component Value Date   VITAMINB12 620 09/15/2018      The ASCVD Risk score (Arnett DK, et al., 2019) failed to calculate for the following reasons:   The 2019 ASCVD risk score is only valid for ages 87 to 15    Assessment & Plan:   Problem List Items Addressed This Visit       Other   Hb-SS disease without crisis (HCC) - Primary (Chronic)    Relevant Orders   Sickle Cell Panel   Vitamin D deficiency   Relevant Orders   Sickle Cell Panel   Chronic pain syndrome   Relevant Orders   [id number] 11+Oxyco+Alc+Crt-Bund  Ms. Rosene is doing very well and is without complaint on today.  No medication changes will be made.  Patient advised to continue opiate medication regimen and to make prescription request per policy. Discussed the importance of taking folic acid and hydrating consistently.  Patient expressed understanding. Will follow-up by phone with any abnormal lab results.  Return in about 3 months (around 09/14/2023) for sickle cell anemia.   Nolon Nations  APRN, MSN, FNP-C Patient Care [facility] [address] [place], [place] [address] [phone]

## 2023-06-14 NOTE — Progress Notes (Signed)
Pt presents for sickle cell disease mgmt -no other concerns -need refill on cyclobenzaprine and folic acid if appropriate

## 2023-06-18 LAB — CANNABINOID CONFIRMATION, UR: Carboxy THC GC/MS Conf: 631 ng/mL

## 2023-06-18 LAB — OPIATES CONFIRMATION, URINE
Codeine: NEGATIVE
Hydrocodone: NEGATIVE
Opiates: POSITIVE ng/mL — AB

## 2023-06-18 LAB — DRUG SCREEN 764883 11+OXYCO+ALC+CRT-BUND
Amphetamines, Urine: NEGATIVE ng/mL
Ethanol: NEGATIVE %

## 2023-06-21 ENCOUNTER — Other Ambulatory Visit: Payer: Self-pay

## 2023-06-21 DIAGNOSIS — D571 Sickle-cell disease without crisis: Secondary | ICD-10-CM

## 2023-06-21 DIAGNOSIS — Z79891 Long term (current) use of opiate analgesic: Secondary | ICD-10-CM

## 2023-06-23 ENCOUNTER — Other Ambulatory Visit: Payer: Self-pay

## 2023-06-23 ENCOUNTER — Inpatient Hospital Stay (HOSPITAL_COMMUNITY)
Admission: EM | Admit: 2023-06-23 | Discharge: 2023-06-27 | DRG: 812 | Disposition: A | Discharge status: Home / Self Care | Payer: MEDICAID | Attending: Internal Medicine | Admitting: Internal Medicine

## 2023-06-23 ENCOUNTER — Encounter (HOSPITAL_COMMUNITY): Payer: Self-pay

## 2023-06-23 DIAGNOSIS — D638 Anemia in other chronic diseases classified elsewhere: Secondary | ICD-10-CM | POA: Diagnosis not present

## 2023-06-23 DIAGNOSIS — F17210 Nicotine dependence, cigarettes, uncomplicated: Secondary | ICD-10-CM | POA: Diagnosis present

## 2023-06-23 DIAGNOSIS — G894 Chronic pain syndrome: Secondary | ICD-10-CM | POA: Diagnosis present

## 2023-06-23 DIAGNOSIS — Z79891 Long term (current) use of opiate analgesic: Secondary | ICD-10-CM

## 2023-06-23 DIAGNOSIS — Z841 Family history of disorders of kidney and ureter: Secondary | ICD-10-CM

## 2023-06-23 DIAGNOSIS — Z832 Family history of diseases of the blood and blood-forming organs and certain disorders involving the immune mechanism: Secondary | ICD-10-CM

## 2023-06-23 DIAGNOSIS — D5700 Hb-SS disease with crisis, unspecified: Principal | ICD-10-CM | POA: Diagnosis present

## 2023-06-23 DIAGNOSIS — Z8269 Family history of other diseases of the musculoskeletal system and connective tissue: Secondary | ICD-10-CM | POA: Diagnosis not present

## 2023-06-23 DIAGNOSIS — Z8249 Family history of ischemic heart disease and other diseases of the circulatory system: Secondary | ICD-10-CM | POA: Diagnosis not present

## 2023-06-23 DIAGNOSIS — Z8261 Family history of arthritis: Secondary | ICD-10-CM

## 2023-06-23 DIAGNOSIS — Z79899 Other long term (current) drug therapy: Secondary | ICD-10-CM

## 2023-06-23 DIAGNOSIS — D571 Sickle-cell disease without crisis: Secondary | ICD-10-CM | POA: Diagnosis not present

## 2023-06-23 DIAGNOSIS — E876 Hypokalemia: Secondary | ICD-10-CM | POA: Diagnosis present

## 2023-06-23 DIAGNOSIS — Z888 Allergy status to other drugs, medicaments and biological substances status: Secondary | ICD-10-CM

## 2023-06-23 LAB — COMPREHENSIVE METABOLIC PANEL
ALT: 21 U/L (ref 0–44)
AST: 25 U/L (ref 15–41)
Albumin: 4.2 g/dL (ref 3.5–5.0)
Alkaline Phosphatase: 60 U/L (ref 38–126)
Anion gap: 7 (ref 5–15)
BUN: 8 mg/dL (ref 6–20)
CO2: 21 mmol/L — ABNORMAL LOW (ref 22–32)
Calcium: 9.3 mg/dL (ref 8.9–10.3)
Chloride: 109 mmol/L (ref 98–111)
Creatinine, Ser: 0.4 mg/dL — ABNORMAL LOW (ref 0.44–1.00)
GFR, Estimated: >60 mL/min (ref >60)
Glucose, Bld: 91 mg/dL (ref 70–99)
Potassium: 3.5 mmol/L (ref 3.5–5.1)
Sodium: 137 mmol/L (ref 135–145)
Total Bilirubin: 1.2 mg/dL (ref 0.3–1.2)
Total Protein: 8.1 g/dL (ref 6.5–8.1)

## 2023-06-23 LAB — CBC WITH DIFFERENTIAL/PLATELET
Abs Immature Granulocytes: 0.02 10*3/uL (ref 0.00–0.07)
Basophils Absolute: 0 10*3/uL (ref 0.0–0.1)
Basophils Relative: 1 %
Eosinophils Absolute: 0 10*3/uL (ref 0.0–0.5)
Eosinophils Relative: 1 %
HCT: 29.6 % — ABNORMAL LOW (ref 36.0–46.0)
Hemoglobin: 10.6 g/dL — ABNORMAL LOW (ref 12.0–15.0)
Immature Granulocytes: 0 %
Lymphocytes Relative: 32 %
Lymphs Abs: 2 10*3/uL (ref 0.7–4.0)
MCH: 29.4 pg (ref 26.0–34.0)
MCHC: 35.8 g/dL (ref 30.0–36.0)
MCV: 82 fL (ref 80.0–100.0)
Monocytes Absolute: 0.8 10*3/uL (ref 0.1–1.0)
Monocytes Relative: 13 %
Neutro Abs: 3.4 10*3/uL (ref 1.7–7.7)
Neutrophils Relative %: 53 %
Platelets: 532 10*3/uL — ABNORMAL HIGH (ref 150–400)
RBC: 3.61 MIL/uL — ABNORMAL LOW (ref 3.87–5.11)
RDW: 13 % (ref 11.5–15.5)
WBC: 6.4 10*3/uL (ref 4.0–10.5)
nRBC: 0.3 % — ABNORMAL HIGH (ref 0.0–0.2)

## 2023-06-23 LAB — URINALYSIS, ROUTINE W REFLEX MICROSCOPIC
Bilirubin Urine: NEGATIVE
Glucose, UA: NEGATIVE mg/dL
Hgb urine dipstick: NEGATIVE
Ketones, ur: NEGATIVE mg/dL
Leukocytes,Ua: NEGATIVE
Nitrite: NEGATIVE
Protein, ur: NEGATIVE mg/dL
Specific Gravity, Urine: 1.004 — ABNORMAL LOW (ref 1.005–1.030)
pH: 7 (ref 5.0–8.0)

## 2023-06-23 LAB — CBC
HCT: 29.7 % — ABNORMAL LOW (ref 36.0–46.0)
Hemoglobin: 10.7 g/dL — ABNORMAL LOW (ref 12.0–15.0)
MCH: 29.5 pg (ref 26.0–34.0)
MCHC: 36 g/dL (ref 30.0–36.0)
MCV: 81.8 fL (ref 80.0–100.0)
Platelets: 199 10*3/uL (ref 150–400)
RBC: 3.63 MIL/uL — ABNORMAL LOW (ref 3.87–5.11)
RDW: 12.8 % (ref 11.5–15.5)
WBC: 7.5 10*3/uL (ref 4.0–10.5)
nRBC: 0 % (ref 0.0–0.2)

## 2023-06-23 LAB — CREATININE, SERUM
Creatinine, Ser: 0.67 mg/dL (ref 0.44–1.00)
GFR, Estimated: >60 mL/min (ref >60)

## 2023-06-23 LAB — HCG, SERUM, QUALITATIVE: Preg, Serum: NEGATIVE

## 2023-06-23 MED ORDER — CYCLOBENZAPRINE HCL 10 MG PO TABS
10.0000 mg | ORAL_TABLET | Freq: Every day | ORAL | Status: DC
  Administered 2023-06-23 – 2023-06-26 (×4): 10 mg via ORAL
  Filled 2023-06-23 (×4): qty 1

## 2023-06-23 MED ORDER — SODIUM CHLORIDE 0.9 % IV SOLN
12.5000 mg | Freq: Once | INTRAVENOUS | Status: AC
  Administered 2023-06-23: 12.5 mg via INTRAVENOUS
  Filled 2023-06-23: qty 12.5

## 2023-06-23 MED ORDER — KETOROLAC TROMETHAMINE 15 MG/ML IJ SOLN
15.0000 mg | INTRAMUSCULAR | Status: AC
  Administered 2023-06-23: 15 mg via INTRAVENOUS
  Filled 2023-06-23: qty 1

## 2023-06-23 MED ORDER — ONDANSETRON HCL 4 MG/2ML IJ SOLN
4.0000 mg | INTRAMUSCULAR | Status: DC | PRN
  Administered 2023-06-23: 4 mg via INTRAVENOUS
  Filled 2023-06-23 (×2): qty 2

## 2023-06-23 MED ORDER — POLYETHYLENE GLYCOL 3350 17 G PO PACK: 17.0000 g | PACK | Freq: Every day | ORAL | Status: DC | PRN

## 2023-06-23 MED ORDER — VITAMIN D (ERGOCALCIFEROL) 1.25 MG (50000 UNIT) PO CAPS
50000.0000 [IU] | ORAL_CAPSULE | ORAL | Status: DC
  Administered 2023-06-26: 50000 [IU] via ORAL
  Filled 2023-06-23: qty 1

## 2023-06-23 MED ORDER — SODIUM CHLORIDE 0.9% FLUSH: 9.0000 mL | INTRAVENOUS | Status: DC | PRN

## 2023-06-23 MED ORDER — HYDROMORPHONE 1 MG/ML IV SOLN
INTRAVENOUS | Status: DC
  Administered 2023-06-23: 30 mg via INTRAVENOUS
  Administered 2023-06-23 – 2023-06-24 (×2): 5 mg via INTRAVENOUS
  Administered 2023-06-24: 30 mg via INTRAVENOUS
  Administered 2023-06-24: 3 mg via INTRAVENOUS
  Administered 2023-06-24: 4.5 mg via INTRAVENOUS
  Administered 2023-06-25 (×2): 4 mg via INTRAVENOUS
  Administered 2023-06-25: 30 mg via INTRAVENOUS
  Administered 2023-06-25: 6.5 mg via INTRAVENOUS
  Administered 2023-06-26: 30 mg via INTRAVENOUS
  Administered 2023-06-26: 8 mg via INTRAVENOUS
  Administered 2023-06-26: 6 mg via INTRAVENOUS
  Administered 2023-06-26: 3.5 mg via INTRAVENOUS
  Administered 2023-06-27: 4.5 mg via INTRAVENOUS
  Administered 2023-06-27: 3.5 mg via INTRAVENOUS
  Filled 2023-06-23 (×5): qty 30

## 2023-06-23 MED ORDER — KETOROLAC TROMETHAMINE 15 MG/ML IJ SOLN
15.0000 mg | Freq: Four times a day (QID) | INTRAMUSCULAR | Status: DC
  Administered 2023-06-23 – 2023-06-27 (×15): 15 mg via INTRAVENOUS
  Filled 2023-06-23 (×15): qty 1

## 2023-06-23 MED ORDER — ENOXAPARIN SODIUM 40 MG/0.4ML IJ SOSY
40.0000 mg | PREFILLED_SYRINGE | INTRAMUSCULAR | Status: DC
  Administered 2023-06-23 – 2023-06-24 (×2): 40 mg via SUBCUTANEOUS
  Filled 2023-06-23 (×3): qty 0.4

## 2023-06-23 MED ORDER — ONDANSETRON HCL 4 MG/2ML IJ SOLN
4.0000 mg | Freq: Four times a day (QID) | INTRAMUSCULAR | Status: DC | PRN
  Administered 2023-06-24 (×2): 4 mg via INTRAVENOUS
  Filled 2023-06-23 (×2): qty 2

## 2023-06-23 MED ORDER — DIPHENHYDRAMINE HCL 25 MG PO CAPS: 25.0000 mg | ORAL_CAPSULE | ORAL | Status: DC | PRN

## 2023-06-23 MED ORDER — MORPHINE SULFATE (PF) 4 MG/ML IV SOLN
8.0000 mg | INTRAVENOUS | Status: AC
  Administered 2023-06-23: 8 mg via INTRAVENOUS
  Filled 2023-06-23: qty 2

## 2023-06-23 MED ORDER — SENNOSIDES-DOCUSATE SODIUM 8.6-50 MG PO TABS
1.0000 | ORAL_TABLET | Freq: Two times a day (BID) | ORAL | Status: DC
  Administered 2023-06-23 – 2023-06-27 (×8): 1 via ORAL
  Filled 2023-06-23 (×8): qty 1

## 2023-06-23 MED ORDER — MORPHINE SULFATE (PF) 4 MG/ML IV SOLN: 10.0000 mg | INTRAVENOUS | Status: AC

## 2023-06-23 MED ORDER — MORPHINE SULFATE (PF) 4 MG/ML IV SOLN
8.0000 mg | Freq: Once | INTRAVENOUS | Status: AC
  Administered 2023-06-23: 8 mg via INTRAVENOUS
  Filled 2023-06-23: qty 2

## 2023-06-23 MED ORDER — DEXTROSE-SODIUM CHLORIDE 5-0.45 % IV SOLN: INTRAVENOUS | Status: DC

## 2023-06-23 MED ORDER — MORPHINE SULFATE ER 15 MG PO TBCR
60.0000 mg | EXTENDED_RELEASE_TABLET | Freq: Two times a day (BID) | ORAL | Status: DC
  Administered 2023-06-23 – 2023-06-27 (×8): 60 mg via ORAL
  Filled 2023-06-23 (×8): qty 4

## 2023-06-23 MED ORDER — MORPHINE SULFATE ER 60 MG PO TBCR
60.0000 mg | EXTENDED_RELEASE_TABLET | Freq: Two times a day (BID) | ORAL | 0 refills | Status: DC
Start: 2023-06-23 — End: 2023-06-27

## 2023-06-23 MED ORDER — NALOXONE HCL 0.4 MG/ML IJ SOLN: 0.4000 mg | INTRAMUSCULAR | Status: DC | PRN

## 2023-06-23 NOTE — Plan of Care (Signed)
  Problem: Education: Goal: Knowledge of vaso-occlusive preventative measures will improve Outcome: Progressing Goal: Long-term complications will improve Outcome: Progressing   Problem: Self-Care: Goal: Ability to incorporate actions that prevent/reduce pain crisis will improve Outcome: Progressing   Problem: Bowel/Gastric: Goal: Gut motility will be maintained Outcome: Progressing   Problem: Tissue Perfusion: Goal: Complications related to inadequate tissue perfusion will be avoided or minimized Outcome: Progressing   Problem: Respiratory: Goal: Pulmonary complications will be avoided or minimized Outcome: Progressing   Problem: Sensory: Goal: Pain level will decrease with appropriate interventions Outcome: Progressing   Problem: Pain Managment: Goal: General experience of comfort will improve Outcome: Progressing   Problem: Safety: Goal: Ability to remain free from injury will improve Outcome: Progressing

## 2023-06-23 NOTE — ED Provider Notes (Signed)
[place] EMERGENCY DEPARTMENT AT [facility] Provider Note   CSN: [id number] Arrival date & time: 06/23/23  2841     History  Chief Complaint  Patient presents with   Sickle Cell Pain Crisis    Carmen [name] is a 32 y.o. female.  HPI 32 yo female ho ss dz co low back pain that began on Monday.  REports taking ms 60 mg q12 hours.  Last took 630 loc, did not take this am. Took oxycodone at 10 mg at [place].  Did not take anything this am, because she was coming here.  She thinks the cold water that she went swimming in may have triggered it.  She denies frequency of urination, pain with urination, deneis fever or chills, no vomiting or diarrhea.  PMD [place] Hollis, NP  Also, seen at [facility].  States calling [facility] for two days and unable to get in touch. REviewed last d/c summary: Ss anemia Tobacco abuse Chronic pain syndrome      Home Medications Prior to Admission medications   Medication Sig Start Date End Date Taking? Authorizing Provider  cyclobenzaprine (FLEXERIL) 10 MG tablet Take 1 tablet (10 mg total) by mouth at bedtime. TAKE 1 TABLET BY MOUTH DAILY AS NEEDED FOR MUSCLE SPASMS. 06/14/23   Massie Maroon, FNP  DULoxetine (CYMBALTA) 30 MG capsule Take 2 capsules (60 mg total) by mouth daily. Patient not taking: Reported on 04/22/2023 11/18/22 05/17/23  Massie Maroon, FNP  folic acid (FOLVITE) 1 MG tablet Take 1 tablet (1 mg total) by mouth daily. 03/15/19   Mike Gip, FNP  morphine (MS CONTIN) 60 MG 12 hr tablet Take 1 tablet (60 mg total) by mouth every 12 (twelve) hours. 05/23/23   Massie Maroon, FNP  Oxycodone HCl 10 MG TABS Take 1 tablet (10 mg total) by mouth every 6 (six) hours as needed (pain). 06/14/23   Massie Maroon, FNP  Vitamin D, Ergocalciferol, (DRISDOL) 1.25 MG (50000 UNIT) CAPS capsule TAKE ONE CAPSULE BY MOUTH ONE TIME PER WEEK Patient taking differently: Take 50,000 Units by mouth every Saturday. 08/24/22   Massie Maroon, FNP       Allergies    Ketamine    Review of Systems   Review of Systems  Physical Exam Updated Vital Signs BP (!) 124/99   Pulse (!) 120   Temp 98.7 F (37.1 C) (Oral)   Resp 17   Ht 1.6 m (5\' 3" )   Wt 88.5 kg   LMP 05/18/2023   SpO2 100%   BMI 34.54 kg/m  Physical Exam Vitals and nursing note reviewed.  Constitutional:      General: She is not in acute distress.    Appearance: She is well-developed.  HENT:     Head: Normocephalic and atraumatic.     Right Ear: External ear normal.     Left Ear: External ear normal.     Nose: Nose normal.  Eyes:     Conjunctiva/sclera: Conjunctivae normal.     Pupils: Pupils are equal, round, and reactive to light.  Pulmonary:     Effort: Pulmonary effort is normal.  Musculoskeletal:        General: Normal range of motion.     Cervical back: Normal range of motion and neck supple.  Skin:    General: Skin is warm and dry.  Neurological:     Mental Status: She is alert and oriented to person, place, and time.     Motor:  No abnormal muscle tone.     Coordination: Coordination normal.  Psychiatric:        Behavior: Behavior normal.        Thought Content: Thought content normal.     ED Results / Procedures / Treatments   Labs (all labs ordered are listed, but only abnormal results are displayed) Labs Reviewed - No data to display  EKG None  Radiology No results found.  Procedures Procedures    Medications Ordered in ED Medications - No data to display  ED Course/ Medical Decision Making/ A&P Clinical Course as of 06/23/23 1320  Thu Jun 23, 2023  1210 CBC reviewed interpreted and hemoglobin is stable from prior at 10.6 [DR]  1210 Complete metabolic panel is reviewed interpreted and is within normal limits Pregnancy test is negative [DR]    Clinical Course User Index [DR] Margarita Grizzle, MD                                 Medical Decision Making Amount and/or Complexity of Data Reviewed Labs:  ordered.  Risk Prescription drug management.   61:28 PM  32 year old female with [place] disease presents today with chronic low back pain.  She reports that her home medications are not controlling her pain.  Here in the ED she is treated with IV fluids and morphine.  She states she gets better for a few minutes and then pain worsens. She was evaluated with CBC shows stable hemoglobin of 10.6. She is asplenic Metabolic panel is normal Test is negative Urinalysis is clear Patient request Discussed with sickle cell clinic.  I have placed a page to sickle cell clinic        Final Clinical Impression(s) / ED Diagnoses Final diagnoses:  None    Rx / DC Orders ED Discharge Orders     None         Margarita Grizzle, MD 06/27/23 1039

## 2023-06-23 NOTE — H&P (Signed)
History and Physical    Patient: Carmen [name]:[id number] DOB: August 11, 1991 DOA: 06/23/2023 DOS: the patient was seen and examined on 06/23/2023 PCP: Massie Maroon, FNP  Patient coming from: Home  Chief Complaint:  Chief Complaint  Patient presents with   Sickle Cell Pain Crisis   HPI: Carmen [name] is a 32 y.o. female with medical history significant of patient with history of sickle cell disease, chronic pain syndrome, anxiety disorder, remote history of syphilis treated, chronic pain syndrome, who presented to the ER with pain in her legs back as well as arms.  Pain has been going on for a few days.  Consistent with sickle cell pain crisis.  Patient has taken her home regimen but no relief.  She has received IV Dilaudid in the ER.  After 6 mg so far back pain persists.  She gets some relief on her legs but the lower back is 10 out of 10.  No nausea vomiting or diarrhea denied any other complaints.  Patient being admitted to the hospital for further treatment of her sickle cell pain crisis.  Review of Systems: As mentioned in the history of present illness. All other systems reviewed and are negative. Past Medical History:  Diagnosis Date   Anxiety    Headache(784.0)    Heart murmur    Sickle cell crisis (HCC)    Syphilis 2015   Was diagnosed and received one injection of antibiotics   Thrombocytosis 11/22/2014     CBC Latest Ref Rng & Units 12/13/2018 12/11/2018 12/10/2018 WBC 4.0 - 10.5 K/uL 14.8(H) 13.2(H) 15.9(H) Hemoglobin 12.0 - 15.0 g/dL 8.2(L) 7.7(L) 8.1(L) Hematocrit 36.0 - 46.0 % 23.7(L) 23.2(L) 24.5(L) Platelets 150 - 400 K/uL 326 399 388     Past Surgical History:  Procedure Laterality Date   CESAREAN SECTION N/A 02/05/2019   Procedure: CESAREAN SECTION;  Surgeon: Hermina Staggers, MD;  Location: [facility] LD ORS;  Service: Obstetrics;  Laterality: N/A;   CHOLECYSTECTOMY N/A 11/30/2014   Procedure: LAPAROSCOPIC CHOLECYSTECTOMY SINGLE SITE WITH INTRAOPERATIVE CHOLANGIOGRAM;   Surgeon: Karie Soda, MD;  Location: [facility] ORS;  Service: General;  Laterality: N/A;   SPLENECTOMY     Social History:  reports that she has been smoking cigarettes. She has never used smokeless tobacco. She reports that she does not drink alcohol and does not use drugs.  Allergies  Allergen Reactions   Ketamine Hives, Nausea And Vomiting and Other (See Comments)    Makes the patient "feel funny" also    Family History  Problem Relation Age of Onset   Hypertension Mother    Sickle cell anemia Sister    Kidney disease Sister        Lupus   Arthritis Sister    Sickle cell anemia Sister    Sickle cell trait Sister    Heart disease Maternal Aunt        CABG   Heart disease Maternal Uncle        CABG   Lupus Sister     Prior to Admission medications   Medication Sig Start Date End Date Taking? Authorizing Provider  cyclobenzaprine (FLEXERIL) 10 MG tablet Take 1 tablet (10 mg total) by mouth at bedtime. TAKE 1 TABLET BY MOUTH DAILY AS NEEDED FOR MUSCLE SPASMS. Patient taking differently: Take 10 mg by mouth at bedtime. 06/14/23  Yes Massie Maroon, FNP  morphine (MS CONTIN) 60 MG 12 hr tablet Take 1 tablet (60 mg total) by mouth every 12 (twelve) hours. 05/23/23  Yes Massie Maroon, FNP  Oxycodone HCl 10 MG TABS Take 1 tablet (10 mg total) by mouth every 6 (six) hours as needed (pain). 06/14/23  Yes Massie Maroon, FNP  Vitamin D, Ergocalciferol, (DRISDOL) 1.25 MG (50000 UNIT) CAPS capsule TAKE ONE CAPSULE BY MOUTH ONE TIME PER WEEK Patient taking differently: Take 50,000 Units by mouth every Sunday. 08/24/22  Yes Massie Maroon, FNP  DULoxetine (CYMBALTA) 30 MG capsule Take 2 capsules (60 mg total) by mouth daily. Patient not taking: Reported on 04/22/2023 11/18/22 05/17/23  Massie Maroon, FNP  folic acid (FOLVITE) 1 MG tablet Take 1 tablet (1 mg total) by mouth daily. Patient not taking: Reported on 06/23/2023 03/15/19   Mike Gip, FNP    Physical Exam: Vitals:    06/23/23 0937 06/23/23 1000 06/23/23 1214 06/23/23 1230  BP:  130/87  109/89  Pulse:  73  78  Resp:  17  17  Temp:   98.6 F (37 C)   TempSrc:   Oral   SpO2:  100%  98%  Weight: 88.5 kg     Height: 5\' 3"  (1.6 m)      Constitutional: NAD, calm, comfortable Eyes: PERRL, lids and conjunctivae normal ENMT: Mucous membranes are moist. Posterior pharynx clear of any exudate or lesions.Normal dentition.  Neck: normal, supple, no masses, no thyromegaly Respiratory: clear to auscultation bilaterally, no wheezing, no crackles. Normal respiratory effort. No accessory muscle use.  Cardiovascular: Regular rate and rhythm, no murmurs / rubs / gallops. No extremity edema. 2+ pedal pulses. No carotid bruits.  Abdomen: no tenderness, no masses palpated. No hepatosplenomegaly. Bowel sounds positive.  Musculoskeletal: Good range of motion, no joint swelling or tenderness, Skin: no rashes, lesions, ulcers. No induration Neurologic: CN 2-12 grossly intact. Sensation intact, DTR normal. Strength 5/5 in all 4.  Psychiatric: Normal judgment and insight. Alert and oriented x 3. Normal mood  Data Reviewed:  CO2 21 creatinine 0.40, hemoglobin 10.7 platelets 199.  Urinalysis negative.  Assessment and Plan:  #1 sickle cell pain crisis: Patient will be admitted and initiated on Dilaudid PCA, Toradol, IV D5 half-normal at 125 cc an hour, her long-acting MS Contin.  Frequent checks.  #2 anemia of chronic disease, hemoglobin is at baseline.  Continue monitor  #3 chronic pain syndrome: Resume home MS Contin.  Continue to monitor.    Advance Care Planning:   Code Status: Prior full code  Consults: None  Family Communication: No family at bedside  Severity of Illness: The appropriate patient status for this patient is INPATIENT. Inpatient status is judged to be reasonable and necessary in order to provide the required intensity of service to ensure the patient's safety. The patient's presenting symptoms,  physical exam findings, and initial radiographic and laboratory data in the context of their chronic comorbidities is felt to place them at high risk for further clinical deterioration. Furthermore, it is not anticipated that the patient will be medically stable for discharge from the hospital within 2 midnights of admission.   * I certify that at the point of admission it is my clinical judgment that the patient will require inpatient hospital care spanning beyond 2 midnights from the point of admission due to high intensity of service, high risk for further deterioration and high frequency of surveillance required.*  AuthorLonia Blood, MD 06/23/2023 2:09 PM  For on call review [email or web address].

## 2023-06-23 NOTE — ED Triage Notes (Addendum)
Patient has been having a sickle cell pain crisis since Monday. Lower back pain and bilateral hip pain. Recently went to a pool party and the cold water caused the pain crisis. Took 10mg  oxycodone at 12am.

## 2023-06-24 DIAGNOSIS — D5700 Hb-SS disease with crisis, unspecified: Secondary | ICD-10-CM | POA: Diagnosis not present

## 2023-06-24 DIAGNOSIS — Z79891 Long term (current) use of opiate analgesic: Secondary | ICD-10-CM | POA: Diagnosis not present

## 2023-06-24 DIAGNOSIS — D638 Anemia in other chronic diseases classified elsewhere: Secondary | ICD-10-CM | POA: Diagnosis not present

## 2023-06-24 LAB — CBC WITH DIFFERENTIAL/PLATELET
Abs Immature Granulocytes: 0.06 10*3/uL (ref 0.00–0.07)
Basophils Absolute: 0 10*3/uL (ref 0.0–0.1)
Basophils Relative: 0 %
Eosinophils Absolute: 0.2 10*3/uL (ref 0.0–0.5)
Eosinophils Relative: 2 %
HCT: 28.2 % — ABNORMAL LOW (ref 36.0–46.0)
Hemoglobin: 10.2 g/dL — ABNORMAL LOW (ref 12.0–15.0)
Immature Granulocytes: 1 %
Lymphocytes Relative: 46 %
Lymphs Abs: 4.7 10*3/uL — ABNORMAL HIGH (ref 0.7–4.0)
MCH: 29.4 pg (ref 26.0–34.0)
MCHC: 36.2 g/dL — ABNORMAL HIGH (ref 30.0–36.0)
MCV: 81.3 fL (ref 80.0–100.0)
Monocytes Absolute: 1.1 10*3/uL — ABNORMAL HIGH (ref 0.1–1.0)
Monocytes Relative: 11 %
Neutro Abs: 4.1 10*3/uL (ref 1.7–7.7)
Neutrophils Relative %: 40 %
Platelets: 395 10*3/uL (ref 150–400)
RBC: 3.47 MIL/uL — ABNORMAL LOW (ref 3.87–5.11)
RDW: 12.7 % (ref 11.5–15.5)
WBC: 10.3 10*3/uL (ref 4.0–10.5)
nRBC: 0 % (ref 0.0–0.2)

## 2023-06-24 LAB — COMPREHENSIVE METABOLIC PANEL
ALT: 22 U/L (ref 0–44)
AST: 31 U/L (ref 15–41)
Albumin: 4 g/dL (ref 3.5–5.0)
Alkaline Phosphatase: 57 U/L (ref 38–126)
Anion gap: 7 (ref 5–15)
BUN: 11 mg/dL (ref 6–20)
CO2: 25 mmol/L (ref 22–32)
Calcium: 8.9 mg/dL (ref 8.9–10.3)
Chloride: 103 mmol/L (ref 98–111)
Creatinine, Ser: 0.63 mg/dL (ref 0.44–1.00)
GFR, Estimated: >60 mL/min (ref >60)
Glucose, Bld: 100 mg/dL — ABNORMAL HIGH (ref 70–99)
Potassium: 3.3 mmol/L — ABNORMAL LOW (ref 3.5–5.1)
Sodium: 135 mmol/L (ref 135–145)
Total Bilirubin: 1.6 mg/dL — ABNORMAL HIGH (ref 0.3–1.2)
Total Protein: 7.6 g/dL (ref 6.5–8.1)

## 2023-06-24 MED ORDER — POTASSIUM CHLORIDE CRYS ER 20 MEQ PO TBCR
20.0000 meq | EXTENDED_RELEASE_TABLET | Freq: Once | ORAL | Status: AC
  Administered 2023-06-24: 20 meq via ORAL
  Filled 2023-06-24: qty 1

## 2023-06-24 NOTE — Plan of Care (Signed)
  Problem: Education: Goal: Knowledge of vaso-occlusive preventative measures will improve Outcome: Progressing Goal: Awareness of infection prevention will improve Outcome: Progressing Goal: Awareness of signs and symptoms of anemia will improve Outcome: Progressing Goal: Long-term complications will improve Outcome: Progressing   Problem: Self-Care: Goal: Ability to incorporate actions that prevent/reduce pain crisis will improve Outcome: Progressing   Problem: Bowel/Gastric: Goal: Gut motility will be maintained Outcome: Progressing   Problem: Tissue Perfusion: Goal: Complications related to inadequate tissue perfusion will be avoided or minimized Outcome: Progressing   Problem: Respiratory: Goal: Pulmonary complications will be avoided or minimized Outcome: Progressing Goal: Acute Chest Syndrome will be identified early to prevent complications Outcome: Progressing   Problem: Fluid Volume: Goal: Ability to maintain a balanced intake and output will improve Outcome: Progressing   Problem: Sensory: Goal: Pain level will decrease with appropriate interventions Outcome: Progressing   Problem: Health Behavior: Goal: Postive changes in compliance with treatment and prescription regimens will improve Outcome: Progressing   Problem: Education: Goal: Knowledge of General Education information will improve Description: Including pain rating scale, medication(s)/side effects and non-pharmacologic comfort measures Outcome: Progressing   Problem: Health Behavior/Discharge Planning: Goal: Ability to manage health-related needs will improve Outcome: Progressing   Problem: Clinical Measurements: Goal: Ability to maintain clinical measurements within normal limits will improve Outcome: Progressing Goal: Will remain free from infection Outcome: Progressing Goal: Diagnostic test results will improve Outcome: Progressing Goal: Respiratory complications will improve Outcome:  Progressing Goal: Cardiovascular complication will be avoided Outcome: Progressing   Problem: Activity: Goal: Risk for activity intolerance will decrease Outcome: Progressing   Problem: Nutrition: Goal: Adequate nutrition will be maintained Outcome: Progressing   Problem: Coping: Goal: Level of anxiety will decrease Outcome: Progressing   Problem: Elimination: Goal: Will not experience complications related to bowel motility Outcome: Progressing Goal: Will not experience complications related to urinary retention Outcome: Progressing   Problem: Pain Managment: Goal: General experience of comfort will improve Outcome: Progressing   Problem: Safety: Goal: Ability to remain free from injury will improve Outcome: Progressing   Problem: Skin Integrity: Goal: Risk for impaired skin integrity will decrease Outcome: Progressing  Pt A/Ox4 on RA. PCA pump running. Scheduled pain medications administered. Pt rating pain 6-7/10. PRN zofran administered for nausea. Up ad lib.

## 2023-06-24 NOTE — Progress Notes (Signed)
   06/24/23 1141  TOC Brief Assessment  Insurance and Status Reviewed  Patient has primary care physician Yes  Home environment has been reviewed yes  Prior level of function: independent  Prior/Current Home Services No current home services  Social Determinants of Health Reivew SDOH reviewed no interventions necessary  Readmission risk has been reviewed Yes  Transition of care needs no transition of care needs at this time

## 2023-06-25 DIAGNOSIS — Z79891 Long term (current) use of opiate analgesic: Secondary | ICD-10-CM

## 2023-06-25 DIAGNOSIS — D638 Anemia in other chronic diseases classified elsewhere: Secondary | ICD-10-CM

## 2023-06-25 DIAGNOSIS — D5700 Hb-SS disease with crisis, unspecified: Principal | ICD-10-CM

## 2023-06-25 NOTE — Plan of Care (Signed)
  Problem: Education: Goal: Knowledge of vaso-occlusive preventative measures will improve Outcome: Progressing Goal: Awareness of infection prevention will improve Outcome: Progressing Goal: Awareness of signs and symptoms of anemia will improve Outcome: Progressing Goal: Long-term complications will improve Outcome: Progressing   Problem: Self-Care: Goal: Ability to incorporate actions that prevent/reduce pain crisis will improve Outcome: Progressing   Problem: Bowel/Gastric: Goal: Gut motility will be maintained Outcome: Progressing  Pt A/Ox4 on RA. PCA pump running. Scheduled pain medications administered. Pain ratings 5-6/10. Pt complained of mild intermittent nausea but denied zofran need.

## 2023-06-25 NOTE — Progress Notes (Signed)
SICKLE CELL SERVICE PROGRESS NOTE  Carmen [name] OZH:[id number] DOB: 10-30-1991 DOA: 06/23/2023 PCP: Massie Maroon, FNP  Assessment/Plan: Active Problems:   Anemia of chronic disease   Chronic prescription opiate use   Sickle cell pain crisis (HCC)  Sickle cell pain crisis: Patient is currently on Dilaudid PCA, Toradol, and IV fluids.  Frequent pain assessment.  Pain is down to 7 out of 10 in her legs and back.  We will continue and continue to titrate Anemia of chronic disease: Continue to monitor H&H. Chronic pain syndrome: Continue chronic pain medication.  Code Status: Full code Family Communication: No family at bedside Disposition Plan: Home  [facility]  Pager [phone]. If 7PM-7AM, please contact night-coverage.  06/24/2023, 8:52 AM  LOS: 2 days   Brief narrative: Carmen [name] is a 32 y.o. female with medical history significant of patient with history of sickle cell disease, chronic pain syndrome, anxiety disorder, remote history of syphilis treated, chronic pain syndrome, who presented to the ER with pain in her legs back as well as arms.  Pain has been going on for a few days.  Consistent with sickle cell pain crisis.  Patient has taken her home regimen but no relief.  She has received IV Dilaudid in the ER.  After 6 mg so far back pain persists.  She gets some relief on her legs but the lower back is 10 out of 10.  No nausea vomiting or diarrhea denied any other complaints.  Patient being admitted to the hospital for further treatment of her sickle cell pain crisis.   Consultants: None  Procedures: None  Antibiotics: None  HPI/Subjective: Patient's pain is still at 8 out of 10.  She is able to walk to the bathroom and back.  Denied any nausea vomiting or diarrhea.  Objective: Vitals:   06/24/23 0022 06/24/23 0029 06/24/23 0429 06/24/23 0557  BP:  109/79  108/60  Pulse:  (!) 53  62  Resp: 17  15 16   Temp:    98.2 F (36.8 C)  TempSrc:      SpO2: 100%  100% 99% 100%  Weight:      Height:       Weight change:   Intake/Output Summary (Last 24 hours) at 06/25/2023 0852 Last data filed at 06/24/2023 1800 Gross per 24 hour  Intake 3482.08 ml  Output --  Net 3482.08 ml    General: Alert, awake, oriented x3, in no acute distress.  HEENT: [place]/AT PEERL, EOMI Neck: Trachea midline,  no masses, no thyromegal,y no JVD, no carotid bruit OROPHARYNX:  Moist, No exudate/ erythema/lesions.  Heart: Regular rate and rhythm, without murmurs, rubs, gallops, PMI non-displaced, no heaves or thrills on palpation.  Lungs: Clear to auscultation, no wheezing or rhonchi noted. No increased vocal fremitus resonant to percussion  Abdomen: Soft, nontender, nondistended, positive bowel sounds, no masses no hepatosplenomegaly noted..  Neuro: No focal neurological deficits noted cranial nerves II through XII grossly intact. DTRs 2+ bilaterally upper and lower extremities. Strength 5 out of 5 in bilateral upper and lower extremities. Musculoskeletal: No warm swelling or erythema around joints, no spinal tenderness noted. Psychiatric: Patient alert and oriented x3, good insight and cognition, good recent to remote recall. Lymph node survey: No cervical axillary or inguinal lymphadenopathy noted.   Data Reviewed: Basic Metabolic Panel: Recent Labs  Lab 06/23/23 0940 06/23/23 1707 06/24/23 0321  NA 137  --  135  K 3.5  --  3.3*  CL 109  --  103  CO2 21*  --  25  GLUCOSE 91  --  100*  BUN 8  --  11  CREATININE 0.40* 0.67 0.63  CALCIUM 9.3  --  8.9   Liver Function Tests: Recent Labs  Lab 06/23/23 0940 06/24/23 0321  AST 25 31  ALT 21 22  ALKPHOS 60 57  BILITOT 1.2 1.6*  PROT 8.1 7.6  ALBUMIN 4.2 4.0   No results for input(s): "LIPASE", "AMYLASE" in the last 168 hours. No results for input(s): "AMMONIA" in the last 168 hours. CBC: Recent Labs  Lab 06/23/23 0940 06/23/23 1712 06/24/23 0321  WBC 6.4 7.5 10.3  NEUTROABS 3.4  --  4.1  HGB 10.6*  10.7* 10.2*  HCT 29.6* 29.7* 28.2*  MCV 82.0 81.8 81.3  PLT 532* 199 395   Cardiac Enzymes: No results for input(s): "CKTOTAL", "CKMB", "CKMBINDEX", "TROPONINI" in the last 168 hours. BNP (last 3 results) No results for input(s): "BNP" in the last 8760 hours.  ProBNP (last 3 results) No results for input(s): "PROBNP" in the last 8760 hours.  CBG: No results for input(s): "GLUCAP" in the last 168 hours.  No results found for this or any previous visit (from the past 240 hour(s)).   Studies: No results found.  Scheduled Meds:  cyclobenzaprine  10 mg Oral QHS   enoxaparin (LOVENOX) injection  40 mg Subcutaneous Q24H   HYDROmorphone   Intravenous Q4H   ketorolac  15 mg Intravenous Q6H   morphine  60 mg Oral Q12H   senna-docusate  1 tablet Oral BID   [START ON 06/26/2023] Vitamin D (Ergocalciferol)  50,000 Units Oral Q Sun   Continuous Infusions:  dextrose 5 % and 0.45 % NaCl 125 mL/hr at 06/25/23 0028    Active Problems:   Anemia of chronic disease   Chronic prescription opiate use   Sickle cell pain crisis (HCC)

## 2023-06-25 NOTE — Progress Notes (Signed)
SICKLE CELL SERVICE PROGRESS NOTE  HOLLISTER LOTTO WGN:[id number] DOB: [birth year] DOA: 06/23/2023 PCP: Massie Maroon, FNP  Assessment/Plan: Active Problems:   Anemia of chronic disease   Chronic prescription opiate use   Sickle cell pain crisis (HCC)  Sickle cell pain crisis: Patient still on Dilaudid PCA, Toradol, IV fluids.  Will decrease IV fluids to [employer]  Continue other medications.  Pain is down to 6 out of 10.  Continue to mobilize patient.  Continue long-acting MS Contin. Anemia of chronic disease: Hemoglobin remained stable at 10.2 which is close to baseline. Hypokalemia: Potassium repleted.  Repeat labs in the morning Chronic pain syndrome: Continue high MS Contin  Code Status: Full code Family Communication: No family at bedside Disposition Plan: Home  [facility]  Pager [phone]. If 7PM-7AM, please contact night-coverage.  06/25/2023, 2:18 PM  LOS: 2 days   Brief narrative: Carmen [name] is a 32 y.o. female with medical history significant of patient with history of sickle cell disease, chronic pain syndrome, anxiety disorder, remote history of syphilis treated, chronic pain syndrome, who presented to the ER with pain in her legs back as well as arms.  Pain has been going on for a few days.  Consistent with sickle cell pain crisis.  Patient has taken her home regimen but no relief.  She has received IV Dilaudid in the ER.  After 6 mg so far back pain persists.  She gets some relief on her legs but the lower back is 10 out of 10.  No nausea vomiting or diarrhea denied any other complaints.  Patient being admitted to the hospital for further treatment of her sickle cell pain crisis.   Consultants: None  Procedures: None  Antibiotics: None  HPI/Subjective: Patient's pain is improving.  Down to 6 out of 10 today.  No fever no chills no nausea vomiting or diarrhea  Objective: Vitals:   06/25/23 0557 06/25/23 0911 06/25/23 1135 06/25/23 1237  BP: 108/60  119/73    Pulse: 62     Resp: 16 10 20 15   Temp: 98.2 F (36.8 C)  98.9 F (37.2 C)   TempSrc:   Oral   SpO2: 100% 98%  99%  Weight:      Height:       Weight change:   Intake/Output Summary (Last 24 hours) at 06/25/2023 1418 Last data filed at 06/25/2023 0830 Gross per 24 hour  Intake 3522.08 ml  Output --  Net 3522.08 ml    General: Alert, awake, oriented x3, in no acute distress.  HEENT: [place]/AT PEERL, EOMI Neck: Trachea midline,  no masses, no thyromegal,y no JVD, no carotid bruit OROPHARYNX:  Moist, No exudate/ erythema/lesions.  Heart: Regular rate and rhythm, without murmurs, rubs, gallops, PMI non-displaced, no heaves or thrills on palpation.  Lungs: Clear to auscultation, no wheezing or rhonchi noted. No increased vocal fremitus resonant to percussion  Abdomen: Soft, nontender, nondistended, positive bowel sounds, no masses no hepatosplenomegaly noted..  Neuro: No focal neurological deficits noted cranial nerves II through XII grossly intact. DTRs 2+ bilaterally upper and lower extremities. Strength 5 out of 5 in bilateral upper and lower extremities. Musculoskeletal: No warm swelling or erythema around joints, no spinal tenderness noted. Psychiatric: Patient alert and oriented x3, good insight and cognition, good recent to remote recall. Lymph node survey: No cervical axillary or inguinal lymphadenopathy noted.   Data Reviewed: Basic Metabolic Panel: Recent Labs  Lab 06/23/23 0940 06/23/23 1707 06/24/23 0321  NA 137  --  135  K 3.5  --  3.3*  CL 109  --  103  CO2 21*  --  25  GLUCOSE 91  --  100*  BUN 8  --  11  CREATININE 0.40* 0.67 0.63  CALCIUM 9.3  --  8.9   Liver Function Tests: Recent Labs  Lab 06/23/23 0940 06/24/23 0321  AST 25 31  ALT 21 22  ALKPHOS 60 57  BILITOT 1.2 1.6*  PROT 8.1 7.6  ALBUMIN 4.2 4.0   No results for input(s): "LIPASE", "AMYLASE" in the last 168 hours. No results for input(s): "AMMONIA" in the last 168 hours. CBC: Recent Labs   Lab 06/23/23 0940 06/23/23 1712 06/24/23 0321  WBC 6.4 7.5 10.3  NEUTROABS 3.4  --  4.1  HGB 10.6* 10.7* 10.2*  HCT 29.6* 29.7* 28.2*  MCV 82.0 81.8 81.3  PLT 532* 199 395   Cardiac Enzymes: No results for input(s): "CKTOTAL", "CKMB", "CKMBINDEX", "TROPONINI" in the last 168 hours. BNP (last 3 results) No results for input(s): "BNP" in the last 8760 hours.  ProBNP (last 3 results) No results for input(s): "PROBNP" in the last 8760 hours.  CBG: No results for input(s): "GLUCAP" in the last 168 hours.  No results found for this or any previous visit (from the past 240 hour(s)).   Studies: No results found.  Scheduled Meds:  cyclobenzaprine  10 mg Oral QHS   enoxaparin (LOVENOX) injection  40 mg Subcutaneous Q24H   HYDROmorphone   Intravenous Q4H   ketorolac  15 mg Intravenous Q6H   morphine  60 mg Oral Q12H   senna-docusate  1 tablet Oral BID   [START ON 06/26/2023] Vitamin D (Ergocalciferol)  50,000 Units Oral Q Sun   Continuous Infusions:  dextrose 5 % and 0.45 % NaCl 125 mL/hr at 06/25/23 0908    Active Problems:   Anemia of chronic disease   Chronic prescription opiate use   Sickle cell pain crisis (HCC)

## 2023-06-26 DIAGNOSIS — Z79891 Long term (current) use of opiate analgesic: Secondary | ICD-10-CM | POA: Diagnosis not present

## 2023-06-26 DIAGNOSIS — D638 Anemia in other chronic diseases classified elsewhere: Secondary | ICD-10-CM | POA: Diagnosis not present

## 2023-06-26 DIAGNOSIS — D5700 Hb-SS disease with crisis, unspecified: Secondary | ICD-10-CM | POA: Diagnosis not present

## 2023-06-26 MED ORDER — POTASSIUM CHLORIDE CRYS ER 20 MEQ PO TBCR
20.0000 meq | EXTENDED_RELEASE_TABLET | Freq: Once | ORAL | Status: AC
  Administered 2023-06-26: 20 meq via ORAL
  Filled 2023-06-26: qty 1

## 2023-06-26 NOTE — Plan of Care (Signed)

## 2023-06-26 NOTE — Progress Notes (Signed)
SICKLE CELL SERVICE PROGRESS NOTE  [name]:[id number] DOB: 16-Dec-1990 DOA: 06/23/2023 PCP: Massie Maroon, FNP  Assessment/Plan: Active Problems:   Anemia of chronic disease   Chronic prescription opiate use   Sickle cell pain crisis (HCC)  Sickle cell pain crisis: Patient still on Dilaudid PCA, Toradol, IV fluids.  Will decrease IV fluids to [facility].  Continue other medications.  Pain is down to 6 out of 10.  Continue to mobilize patient.  Continue long-acting MS Contin. Anemia of chronic disease: Hemoglobin remained stable at 10.2 which is close to baseline. Hypokalemia: Potassium low. Continue to replete.  Repeat labs in the morning Chronic pain syndrome: Continue MS Contin  Code Status: Full code Family Communication: No family at bedside Disposition Plan: Home  [facility]  Pager [phone]. If 7PM-7AM, please contact night-coverage.  06/26/2023, 4:29 PM  LOS: 3 days   Brief narrative: Carmen [name] is a 32 y.o. female with medical history significant of patient with history of sickle cell disease, chronic pain syndrome, anxiety disorder, remote history of syphilis treated, chronic pain syndrome, who presented to the ER with pain in her legs back as well as arms.  Pain has been going on for a few days.  Consistent with sickle cell pain crisis.  Patient has taken her home regimen but no relief.  She has received IV Dilaudid in the ER.  After 6 mg so far back pain persists.  She gets some relief on her legs but the lower back is 10 out of 10.  No nausea vomiting or diarrhea denied any other complaints.  Patient being admitted to the hospital for further treatment of her sickle cell pain crisis.   Consultants: None  Procedures: None  Antibiotics: None  HPI/Subjective: Patient's pain is improving.  Down to 5 out of 10 today.  No fever no chills no nausea vomiting or diarrhea. Potassium was 2.8 today.  Objective: Vitals:   06/26/23 1239 06/26/23 1241 06/26/23 1617  06/26/23 1623  BP: 121/77  126/79   Pulse: 70  78   Resp: 16 13 16 12   Temp: 98.4 F (36.9 C)  98.2 F (36.8 C)   TempSrc:      SpO2: 100% 100% 100%   Weight:      Height:       Weight change:   Intake/Output Summary (Last 24 hours) at 06/26/2023 1629 Last data filed at 06/26/2023 0035 Gross per 24 hour  Intake 360 ml  Output --  Net 360 ml    General: Alert, awake, oriented x3, in no acute distress.  HEENT: [place]/AT PEERL, EOMI Neck: Trachea midline,  no masses, no thyromegal,y no JVD, no carotid bruit OROPHARYNX:  Moist, No exudate/ erythema/lesions.  Heart: Regular rate and rhythm, without murmurs, rubs, gallops, PMI non-displaced, no heaves or thrills on palpation.  Lungs: Clear to auscultation, no wheezing or rhonchi noted. No increased vocal fremitus resonant to percussion  Abdomen: Soft, nontender, nondistended, positive bowel sounds, no masses no hepatosplenomegaly noted..  Neuro: No focal neurological deficits noted cranial nerves II through XII grossly intact. DTRs 2+ bilaterally upper and lower extremities. Strength 5 out of 5 in bilateral upper and lower extremities. Musculoskeletal: No warm swelling or erythema around joints, no spinal tenderness noted. Psychiatric: Patient alert and oriented x3, good insight and cognition, good recent to remote recall. Lymph node survey: No cervical axillary or inguinal lymphadenopathy noted.   Data Reviewed: Basic Metabolic Panel: Recent Labs  Lab 06/23/23 0940 06/23/23 1707 06/24/23  0321  NA 137  --  135  K 3.5  --  3.3*  CL 109  --  103  CO2 21*  --  25  GLUCOSE 91  --  100*  BUN 8  --  11  CREATININE 0.40* 0.67 0.63  CALCIUM 9.3  --  8.9   Liver Function Tests: Recent Labs  Lab 06/23/23 0940 06/24/23 0321  AST 25 31  ALT 21 22  ALKPHOS 60 57  BILITOT 1.2 1.6*  PROT 8.1 7.6  ALBUMIN 4.2 4.0   No results for input(s): "LIPASE", "AMYLASE" in the last 168 hours. No results for input(s): "AMMONIA" in the last  168 hours. CBC: Recent Labs  Lab 06/23/23 0940 06/23/23 1712 06/24/23 0321  WBC 6.4 7.5 10.3  NEUTROABS 3.4  --  4.1  HGB 10.6* 10.7* 10.2*  HCT 29.6* 29.7* 28.2*  MCV 82.0 81.8 81.3  PLT 532* 199 395   Cardiac Enzymes: No results for input(s): "CKTOTAL", "CKMB", "CKMBINDEX", "TROPONINI" in the last 168 hours. BNP (last 3 results) No results for input(s): "BNP" in the last 8760 hours.  ProBNP (last 3 results) No results for input(s): "PROBNP" in the last 8760 hours.  CBG: No results for input(s): "GLUCAP" in the last 168 hours.  No results found for this or any previous visit (from the past 240 hour(s)).   Studies: No results found.  Scheduled Meds:  cyclobenzaprine  10 mg Oral QHS   enoxaparin (LOVENOX) injection  40 mg Subcutaneous Q24H   HYDROmorphone   Intravenous Q4H   ketorolac  15 mg Intravenous Q6H   morphine  60 mg Oral Q12H   senna-docusate  1 tablet Oral BID   Vitamin D (Ergocalciferol)  50,000 Units Oral Q Sun   Continuous Infusions:  dextrose 5 % and 0.45 % NaCl 20 mL/hr at 06/26/23 1610    Active Problems:   Anemia of chronic disease   Chronic prescription opiate use   Sickle cell pain crisis (HCC)

## 2023-06-27 DIAGNOSIS — D571 Sickle-cell disease without crisis: Secondary | ICD-10-CM

## 2023-06-27 MED ORDER — MORPHINE SULFATE ER 60 MG PO TBCR
60.0000 mg | EXTENDED_RELEASE_TABLET | Freq: Two times a day (BID) | ORAL | 0 refills | Status: DC
Start: 2023-06-27 — End: 2023-06-27

## 2023-06-27 MED ORDER — ORAL CARE MOUTH RINSE: 15.0000 mL | OROMUCOSAL | Status: DC | PRN

## 2023-06-27 MED ORDER — MORPHINE SULFATE ER 60 MG PO TBCR
60.0000 mg | EXTENDED_RELEASE_TABLET | Freq: Two times a day (BID) | ORAL | 0 refills | Status: DC
Start: 2023-06-27 — End: 2023-07-26

## 2023-06-27 NOTE — Plan of Care (Signed)

## 2023-06-27 NOTE — Progress Notes (Signed)
Pt d/c to home with self care. No TOC needs. Pt transported by family friend.

## 2023-06-27 NOTE — Plan of Care (Signed)
  Problem: Education: Goal: Knowledge of vaso-occlusive preventative measures will improve Outcome: Adequate for Discharge Goal: Awareness of infection prevention will improve Outcome: Adequate for Discharge Goal: Awareness of signs and symptoms of anemia will improve Outcome: Adequate for Discharge Goal: Long-term complications will improve Outcome: Adequate for Discharge   Problem: Self-Care: Goal: Ability to incorporate actions that prevent/reduce pain crisis will improve Outcome: Adequate for Discharge   Problem: Bowel/Gastric: Goal: Gut motility will be maintained Outcome: Adequate for Discharge   Problem: Tissue Perfusion: Goal: Complications related to inadequate tissue perfusion will be avoided or minimized Outcome: Adequate for Discharge   Problem: Respiratory: Goal: Pulmonary complications will be avoided or minimized Outcome: Adequate for Discharge Goal: Acute Chest Syndrome will be identified early to prevent complications Outcome: Adequate for Discharge   Problem: Fluid Volume: Goal: Ability to maintain a balanced intake and output will improve Outcome: Adequate for Discharge   Problem: Sensory: Goal: Pain level will decrease with appropriate interventions Outcome: Adequate for Discharge   Problem: Health Behavior: Goal: Postive changes in compliance with treatment and prescription regimens will improve Outcome: Adequate for Discharge   Problem: Education: Goal: Knowledge of General Education information will improve Description: Including pain rating scale, medication(s)/side effects and non-pharmacologic comfort measures Outcome: Adequate for Discharge   Problem: Health Behavior/Discharge Planning: Goal: Ability to manage health-related needs will improve Outcome: Adequate for Discharge   Problem: Clinical Measurements: Goal: Ability to maintain clinical measurements within normal limits will improve Outcome: Adequate for Discharge Goal: Will remain  free from infection Outcome: Adequate for Discharge Goal: Diagnostic test results will improve Outcome: Adequate for Discharge Goal: Respiratory complications will improve Outcome: Adequate for Discharge Goal: Cardiovascular complication will be avoided Outcome: Adequate for Discharge   Problem: Activity: Goal: Risk for activity intolerance will decrease Outcome: Adequate for Discharge   Problem: Nutrition: Goal: Adequate nutrition will be maintained Outcome: Adequate for Discharge   Problem: Coping: Goal: Level of anxiety will decrease Outcome: Adequate for Discharge   Problem: Elimination: Goal: Will not experience complications related to bowel motility Outcome: Adequate for Discharge Goal: Will not experience complications related to urinary retention Outcome: Adequate for Discharge   Problem: Pain Managment: Goal: General experience of comfort will improve Outcome: Adequate for Discharge   Problem: Safety: Goal: Ability to remain free from injury will improve Outcome: Adequate for Discharge   Problem: Skin Integrity: Goal: Risk for impaired skin integrity will decrease Outcome: Adequate for Discharge   

## 2023-06-27 NOTE — Discharge Summary (Signed)
Physician Discharge Summary   Patient: Carmen [name] MRN: [id number] DOB: November 20, 1990  Admit date:     06/23/2023  Discharge date: 06/27/2023  Discharge Physician: Lonia Blood   PCP: Massie Maroon, FNP   Recommendations at discharge:   Patient discharged home to resume home regimen of follow-up with PCP.  Discharge Diagnoses: Active Problems:   Anemia of chronic disease   Chronic prescription opiate use   Sickle cell pain crisis (HCC)  Resolved Problems:   * No resolved hospital problems. [facility] Course: No notes on file  Assessment and Plan: No notes have been filed under this hospital service. Service: Hospitalist        Consultants: None Procedures performed: Chest x-ray Disposition: Home Diet recommendation:  Discharge Diet Orders (From admission, onward)     Start     Ordered   06/27/23 0000  Diet - low sodium heart healthy        06/27/23 1101           Regular diet DISCHARGE MEDICATION: Allergies as of 06/27/2023       Reactions   Ketamine Hives, Nausea And Vomiting, Other (See Comments)   Makes the patient "feel funny" also        Medication List     TAKE these medications    cyclobenzaprine 10 MG tablet Commonly known as: FLEXERIL Take 1 tablet (10 mg total) by mouth at bedtime. TAKE 1 TABLET BY MOUTH DAILY AS NEEDED FOR MUSCLE SPASMS. What changed: additional instructions   DULoxetine 30 MG capsule Commonly known as: CYMBALTA Take 2 capsules (60 mg total) by mouth daily.   folic acid 1 MG tablet Commonly known as: FOLVITE Take 1 tablet (1 mg total) by mouth daily.   morphine 60 MG 12 hr tablet Commonly known as: MS CONTIN Take 1 tablet (60 mg total) by mouth every 12 (twelve) hours.   Oxycodone HCl 10 MG Tabs Take 1 tablet (10 mg total) by mouth every 6 (six) hours as needed (pain).   Vitamin D (Ergocalciferol) 1.25 MG (50000 UNIT) Caps capsule Commonly known as: DRISDOL TAKE ONE CAPSULE BY MOUTH ONE TIME PER  WEEK What changed:  how much to take how to take this when to take this additional instructions        Discharge Exam: Filed Weights   06/23/23 0757 06/23/23 0937  Weight: 88.5 kg 88.5 kg   Constitutional: NAD, calm, comfortable Eyes: PERRL, lids and conjunctivae normal ENMT: Mucous membranes are moist. Posterior pharynx clear of any exudate or lesions.Normal dentition.  Neck: normal, supple, no masses, no thyromegaly Respiratory: clear to auscultation bilaterally, no wheezing, no crackles. Normal respiratory effort. No accessory muscle use.  Cardiovascular: Regular rate and rhythm, no murmurs / rubs / gallops. No extremity edema. 2+ pedal pulses. No carotid bruits.  Abdomen: no tenderness, no masses palpated. No hepatosplenomegaly. Bowel sounds positive.  Musculoskeletal: Good range of motion, no joint swelling or tenderness, Skin: no rashes, lesions, ulcers. No induration Neurologic: CN 2-12 grossly intact. Sensation intact, DTR normal. Strength 5/5 in all 4.  Psychiatric: Normal judgment and insight. Alert and oriented x 3. Normal mood   Condition at discharge: good  The results of significant diagnostics from this hospitalization (including imaging, microbiology, ancillary and laboratory) are listed below for reference.   Imaging Studies: No results found.  Microbiology: Results for orders placed or performed during the hospital encounter of 04/07/22  SARS Coronavirus 2 by RT PCR (hospital order, performed in [facility] hospital  lab) *cepheid single result test* Anterior Nasal Swab     Status: None   Collection Time: 04/07/22  7:09 PM   Specimen: Anterior Nasal Swab  Result Value Ref Range Status   SARS Coronavirus 2 by RT PCR NEGATIVE NEGATIVE Final    Comment: (NOTE) SARS-CoV-2 target nucleic acids are NOT DETECTED.  The SARS-CoV-2 RNA is generally detectable in upper and lower respiratory specimens during the acute phase of infection. The lowest concentration  of SARS-CoV-2 viral copies this assay can detect is 250 copies / mL. A negative result does not preclude SARS-CoV-2 infection and should not be used as the sole basis for treatment or other patient management decisions.  A negative result may occur with improper specimen collection / handling, submission of specimen other than nasopharyngeal swab, presence of viral mutation(s) within the areas targeted by this assay, and inadequate number of viral copies (<250 copies / mL). A negative result must be combined with clinical observations, patient history, and epidemiological information.  Fact Sheet for Patients:   RoadLapTop.co.za  Fact Sheet for Healthcare Providers: http://kim-miller.com/  This test is not yet approved or  cleared by the [place] FDA and has been authorized for detection and/or diagnosis of SARS-CoV-2 by FDA under an Emergency Use Authorization (EUA).  This EUA will remain in effect (meaning this test can be used) for the duration of the COVID-19 declaration under Section 564(b)(1) of the Act, 21 U.S.C. section 360bbb-3(b)(1), unless the authorization is terminated or revoked sooner.  Performed at [facility], [address], [place], [place] [address]     Labs: CBC: Recent Labs  Lab 06/23/23 0940 06/23/23 1712 06/24/23 0321  WBC 6.4 7.5 10.3  NEUTROABS 3.4  --  4.1  HGB 10.6* 10.7* 10.2*  HCT 29.6* 29.7* 28.2*  MCV 82.0 81.8 81.3  PLT 532* 199 395   Basic Metabolic Panel: Recent Labs  Lab 06/23/23 0940 06/23/23 1707 06/24/23 0321  NA 137  --  135  K 3.5  --  3.3*  CL 109  --  103  CO2 21*  --  25  GLUCOSE 91  --  100*  BUN 8  --  11  CREATININE 0.40* 0.67 0.63  CALCIUM 9.3  --  8.9   Liver Function Tests: Recent Labs  Lab 06/23/23 0940 06/24/23 0321  AST 25 31  ALT 21 22  ALKPHOS 60 57  BILITOT 1.2 1.6*  PROT 8.1 7.6  ALBUMIN 4.2 4.0   CBG: No results for  input(s): "GLUCAP" in the last 168 hours.  Discharge time spent: greater than patient was admitted to the hospital 30 minutes.  SignedLonia Blood, MD [facility] 06/27/2023

## 2023-06-27 NOTE — Plan of Care (Signed)
Discharge instructions given to patient, patient verbalized understanding. Problem: Education: Goal: Knowledge of vaso-occlusive preventative measures will improve 06/27/2023 1214 by Leonia Corona, RN Outcome: Completed/Met 06/27/2023 1113 by Leonia Corona, RN Outcome: Progressing Goal: Awareness of infection prevention will improve 06/27/2023 1214 by Leonia Corona, RN Outcome: Completed/Met 06/27/2023 1113 by Leonia Corona, RN Outcome: Progressing Goal: Awareness of signs and symptoms of anemia will improve 06/27/2023 1214 by Leonia Corona, RN Outcome: Completed/Met 06/27/2023 1113 by Leonia Corona, RN Outcome: Progressing Goal: Long-term complications will improve 06/27/2023 1214 by Leonia Corona, RN Outcome: Completed/Met 06/27/2023 1113 by Leonia Corona, RN Outcome: Progressing   Problem: Self-Care: Goal: Ability to incorporate actions that prevent/reduce pain crisis will improve 06/27/2023 1214 by Leonia Corona, RN Outcome: Completed/Met 06/27/2023 1113 by Leonia Corona, RN Outcome: Progressing   Problem: Bowel/Gastric: Goal: Gut motility will be maintained 06/27/2023 1214 by Leonia Corona, RN Outcome: Completed/Met 06/27/2023 1113 by Leonia Corona, RN Outcome: Progressing   Problem: Tissue Perfusion: Goal: Complications related to inadequate tissue perfusion will be avoided or minimized 06/27/2023 1214 by Leonia Corona, RN Outcome: Completed/Met 06/27/2023 1113 by Leonia Corona, RN Outcome: Progressing   Problem: Respiratory: Goal: Pulmonary complications will be avoided or minimized 06/27/2023 1214 by Leonia Corona, RN Outcome: Completed/Met 06/27/2023 1113 by Leonia Corona, RN Outcome: Progressing Goal: Acute Chest Syndrome will be identified early to prevent complications 06/27/2023 1214 by Leonia Corona, RN Outcome: Completed/Met 06/27/2023 1113 by Leonia Corona, RN Outcome: Progressing   Problem: Fluid Volume: Goal: Ability to  maintain a balanced intake and output will improve 06/27/2023 1214 by Leonia Corona, RN Outcome: Completed/Met 06/27/2023 1113 by Leonia Corona, RN Outcome: Progressing   Problem: Sensory: Goal: Pain level will decrease with appropriate interventions 06/27/2023 1214 by Leonia Corona, RN Outcome: Completed/Met 06/27/2023 1113 by Leonia Corona, RN Outcome: Progressing   Problem: Health Behavior: Goal: Postive changes in compliance with treatment and prescription regimens will improve 06/27/2023 1214 by Leonia Corona, RN Outcome: Completed/Met 06/27/2023 1113 by Leonia Corona, RN Outcome: Progressing   Problem: Education: Goal: Knowledge of General Education information will improve Description: Including pain rating scale, medication(s)/side effects and non-pharmacologic comfort measures 06/27/2023 1214 by Leonia Corona, RN Outcome: Completed/Met 06/27/2023 1113 by Leonia Corona, RN Outcome: Progressing   Problem: Health Behavior/Discharge Planning: Goal: Ability to manage health-related needs will improve 06/27/2023 1214 by Leonia Corona, RN Outcome: Completed/Met 06/27/2023 1113 by Leonia Corona, RN Outcome: Progressing   Problem: Clinical Measurements: Goal: Ability to maintain clinical measurements within normal limits will improve 06/27/2023 1214 by Leonia Corona, RN Outcome: Completed/Met 06/27/2023 1113 by Leonia Corona, RN Outcome: Progressing Goal: Will remain free from infection 06/27/2023 1214 by Leonia Corona, RN Outcome: Completed/Met 06/27/2023 1113 by Leonia Corona, RN Outcome: Progressing Goal: Diagnostic test results will improve 06/27/2023 1214 by Leonia Corona, RN Outcome: Completed/Met 06/27/2023 1113 by Leonia Corona, RN Outcome: Progressing Goal: Respiratory complications will improve 06/27/2023 1214 by Leonia Corona, RN Outcome: Completed/Met 06/27/2023 1113 by Leonia Corona, RN Outcome: Progressing Goal: Cardiovascular  complication will be avoided 06/27/2023 1214 by Leonia Corona, RN Outcome: Completed/Met 06/27/2023 1113 by Leonia Corona, RN Outcome: Progressing   Problem: Activity: Goal: Risk for activity intolerance will decrease 06/27/2023 1214 by Leonia Corona, RN Outcome: Completed/Met 06/27/2023 1113 by Leonia Corona, RN Outcome: Progressing  Problem: Nutrition: Goal: Adequate nutrition will be maintained 06/27/2023 1214 by Leonia Corona, RN Outcome: Completed/Met 06/27/2023 1113 by Leonia Corona, RN Outcome: Progressing   Problem: Coping: Goal: Level of anxiety will decrease 06/27/2023 1214 by Leonia Corona, RN Outcome: Completed/Met 06/27/2023 1113 by Leonia Corona, RN Outcome: Progressing   Problem: Elimination: Goal: Will not experience complications related to bowel motility 06/27/2023 1214 by Leonia Corona, RN Outcome: Completed/Met 06/27/2023 1113 by Leonia Corona, RN Outcome: Progressing Goal: Will not experience complications related to urinary retention 06/27/2023 1214 by Leonia Corona, RN Outcome: Completed/Met 06/27/2023 1113 by Leonia Corona, RN Outcome: Progressing   Problem: Pain Managment: Goal: General experience of comfort will improve 06/27/2023 1214 by Leonia Corona, RN Outcome: Completed/Met 06/27/2023 1113 by Leonia Corona, RN Outcome: Progressing   Problem: Safety: Goal: Ability to remain free from injury will improve 06/27/2023 1214 by Leonia Corona, RN Outcome: Completed/Met 06/27/2023 1113 by Leonia Corona, RN Outcome: Progressing   Problem: Skin Integrity: Goal: Risk for impaired skin integrity will decrease 06/27/2023 1214 by Leonia Corona, RN Outcome: Completed/Met 06/27/2023 1113 by Leonia Corona, RN Outcome: Progressing

## 2023-06-28 ENCOUNTER — Other Ambulatory Visit: Payer: Self-pay

## 2023-06-28 ENCOUNTER — Telehealth: Payer: Self-pay

## 2023-06-28 DIAGNOSIS — D571 Sickle-cell disease without crisis: Secondary | ICD-10-CM

## 2023-06-28 DIAGNOSIS — Z79891 Long term (current) use of opiate analgesic: Secondary | ICD-10-CM

## 2023-06-28 NOTE — Telephone Encounter (Signed)
Please advise Kh 

## 2023-06-28 NOTE — Transitions of Care (Post Inpatient/ED Visit) (Signed)
   06/28/2023  Name: Carmen [name] MRN: [id number] DOB: July 22, 1991  Today's TOC FU Call Status: Today's TOC FU Call Status:: Successful TOC FU Call Completed TOC FU Call Complete Date: 06/28/23 Patient's Name and Date of Birth confirmed.  Transition Care Management Follow-up Telephone Call Date of Discharge: 06/27/23 Discharge Facility: [facility]) Type of Discharge: Inpatient Admission Primary Inpatient Discharge Diagnosis:: Sickle Cell Pain Crisis How have you been since you were released from the hospital?: Better Any questions or concerns?: No  Items Reviewed: Did you receive and understand the discharge instructions provided?: Yes Medications obtained,verified, and reconciled?: Yes (Medications Reviewed) Any new allergies since your discharge?: No Dietary orders reviewed?: Yes Do you have support at home?: Yes  Medications Reviewed Today: Medications Reviewed Today     Reviewed by Merleen Nicely, LPN (Licensed Practical Nurse) on 06/28/23 at 1108  Med List Status: <None>   Medication Order Taking? Sig Documenting Provider Last Dose Status Informant  cyclobenzaprine (FLEXERIL) 10 MG tablet [id number] Yes Take 1 tablet (10 mg total) by mouth at bedtime. TAKE 1 TABLET BY MOUTH DAILY AS NEEDED FOR MUSCLE SPASMS.  Patient taking differently: Take 10 mg by mouth at bedtime.   Carmen Maroon, FNP Taking Active Self, Pharmacy Records  DULoxetine (CYMBALTA) 30 MG capsule [id number] No Take 2 capsules (60 mg total) by mouth daily.  Patient not taking: Reported on 04/22/2023   Carmen Maroon, FNP Not Taking Expired 05/17/23 2359 Self, Pharmacy Records  folic acid (FOLVITE) 1 MG tablet [id number] No Take 1 tablet (1 mg total) by mouth daily.  Patient not taking: Reported on 06/23/2023   Mike Gip, FNP Not Taking Active Self, Pharmacy Records  morphine (MS CONTIN) 60 MG 12 hr tablet [id number] Yes Take 1 tablet (60 mg total) by mouth every 12 (twelve) hours. Rometta Emery, MD Taking Active   Oxycodone HCl 10 MG TABS [id number] Yes Take 1 tablet (10 mg total) by mouth every 6 (six) hours as needed (pain). Carmen Maroon, FNP Taking Active Self, Pharmacy Records  Vitamin D, Ergocalciferol, (DRISDOL) 1.25 MG (50000 UNIT) CAPS capsule [id number] Yes TAKE ONE CAPSULE BY MOUTH ONE TIME PER WEEK  Patient taking differently: Take 50,000 Units by mouth every Sunday.   Carmen Maroon, FNP Taking Active Self, Pharmacy Records           Med Note Carmen [name], [place] D   Fri Apr 22, 2023  8:06 AM)              Home Care and Equipment/Supplies: Were Home Health Services Ordered?: No Any new equipment or medical supplies ordered?: No  Functional Questionnaire: Do you need assistance with bathing/showering or dressing?: No Do you need assistance with meal preparation?: No Do you need assistance with eating?: No Do you have difficulty maintaining continence: No Do you need assistance with getting out of bed/getting out of a chair/moving?: No Do you have difficulty managing or taking your medications?: No  Follow up appointments reviewed: PCP Follow-up appointment confirmed?: Yes Date of PCP follow-up appointment?: 07/05/23 Follow-up Provider: [name] FNP Specialist [facility] Follow-up appointment confirmed?: No Do you need transportation to your follow-up appointment?: No Do you understand care options if your condition(s) worsen?: Yes-patient verbalized understanding    SIGNATURE  Woodfin Ganja LPN [facility] Nurse Health Advisor Direct Dial [phone]

## 2023-06-29 ENCOUNTER — Other Ambulatory Visit: Payer: Self-pay | Admitting: Family Medicine

## 2023-06-29 DIAGNOSIS — D571 Sickle-cell disease without crisis: Secondary | ICD-10-CM

## 2023-06-29 DIAGNOSIS — Z79891 Long term (current) use of opiate analgesic: Secondary | ICD-10-CM

## 2023-06-29 MED ORDER — OXYCODONE HCL 10 MG PO TABS
10.0000 mg | ORAL_TABLET | Freq: Four times a day (QID) | ORAL | 0 refills | Status: DC | PRN
Start: 2023-06-29 — End: 2023-07-14

## 2023-06-29 NOTE — Progress Notes (Signed)
Meds ordered this encounter  Medications   Oxycodone HCl 10 MG TABS    Sig: Take 1 tablet (10 mg total) by mouth every 6 (six) hours as needed (pain).    Dispense:  60 tablet    Refill:  0    Order Specific Question:   Supervising Provider    Answer:   Quentin Angst [[id number]]   Reviewed PDMP substance reporting system prior to prescribing opiate medications. No inconsistencies noted.      Nolon Nations  APRN, MSN, FNP-C Patient Care [facility] [address] [place], [place] [address] [phone]

## 2023-07-05 ENCOUNTER — Telehealth: Payer: Self-pay

## 2023-07-05 ENCOUNTER — Inpatient Hospital Stay: Payer: Self-pay | Admitting: Family Medicine

## 2023-07-05 NOTE — Telephone Encounter (Signed)
Transition Care Management Unsuccessful Follow-up Telephone Call  Date of discharge and from where:  06/30/23 [facility]  Attempts:  1st Attempt  Reason for unsuccessful TCM follow-up call:  Left voice message At both numbers

## 2023-07-07 ENCOUNTER — Other Ambulatory Visit: Payer: Self-pay

## 2023-07-07 DIAGNOSIS — Z79891 Long term (current) use of opiate analgesic: Secondary | ICD-10-CM

## 2023-07-07 DIAGNOSIS — D571 Sickle-cell disease without crisis: Secondary | ICD-10-CM

## 2023-07-07 NOTE — Hospital Course (Signed)
Patient was admitted and started on Dilaudid PCA, Toradol, IV fluids, long-acting pain medications as well as other supportive care.  Hemoglobin was monitored and followed.  No transfusion was required.  Patient had hypokalemia while in the hospital which was repleted.  Overall she did better at the time of discharge patient's pain was down to a 3 out of 10.  She was discharged to follow-up with PCP.

## 2023-07-07 NOTE — Telephone Encounter (Signed)
Pt needs pa done . Thanks [employer]

## 2023-07-08 ENCOUNTER — Other Ambulatory Visit: Payer: Self-pay | Admitting: Family Medicine

## 2023-07-08 ENCOUNTER — Telehealth: Payer: Self-pay

## 2023-07-08 ENCOUNTER — Other Ambulatory Visit: Payer: Self-pay

## 2023-07-08 NOTE — Telephone Encounter (Signed)
Pharmacy Patient Advocate Encounter   Received notification from Physician's Office that prior authorization for OXYCODONE IR is required/requested.   Insurance verification completed.   The patient is insured through [facility] .   PA SUBMITTED VIA [employer] Key: [id number]

## 2023-07-08 NOTE — Telephone Encounter (Signed)
Pharmacy Patient Advocate Encounter  Received notification from [facility] that Prior Authorization for OXYCODONE IR has been APPROVED from 07/08/2023 to 01/05/2024   PA #/Case ID/Reference #: [id number] [id number]

## 2023-07-14 ENCOUNTER — Other Ambulatory Visit: Payer: Self-pay | Admitting: Family Medicine

## 2023-07-14 DIAGNOSIS — D571 Sickle-cell disease without crisis: Secondary | ICD-10-CM

## 2023-07-14 DIAGNOSIS — Z79891 Long term (current) use of opiate analgesic: Secondary | ICD-10-CM

## 2023-07-14 MED ORDER — OXYCODONE HCL 10 MG PO TABS
10.0000 mg | ORAL_TABLET | Freq: Four times a day (QID) | ORAL | 0 refills | Status: DC | PRN
Start: 2023-07-15 — End: 2023-07-26

## 2023-07-14 NOTE — Telephone Encounter (Signed)
Reviewed PDMP substance reporting system prior to prescribing opiate medications. No inconsistencies noted.  Meds ordered this encounter  Medications   Oxycodone HCl 10 MG TABS    Sig: Take 1 tablet (10 mg total) by mouth every 6 (six) hours as needed (pain).    Dispense:  60 tablet    Refill:  0   Nolon Nations  APRN, MSN, FNP-C Patient Care [facility] [address] [place], [place] [address] [phone]

## 2023-07-25 NOTE — Telephone Encounter (Signed)
Caller & Relationship to patient:  MRN #  [id number]   Call Back Number:   Date of Last Office Visit: 07/14/2023     Date of Next Office Visit: 09/20/2023    Medication(s) to be Refilled: Morphine  Preferred Pharmacy:   ** Please notify patient to allow 48-72 hours to process** **Let patient know to contact pharmacy at the end of the day to make sure medication is ready. ** **If patient has not been seen in a year or longer, book an appointment **Advise to use MyChart for refill requests OR to contact their pharmacy

## 2023-07-26 ENCOUNTER — Other Ambulatory Visit: Payer: Self-pay | Admitting: Family Medicine

## 2023-07-26 ENCOUNTER — Telehealth: Payer: Self-pay | Admitting: Family Medicine

## 2023-07-26 DIAGNOSIS — Z79891 Long term (current) use of opiate analgesic: Secondary | ICD-10-CM

## 2023-07-26 DIAGNOSIS — D571 Sickle-cell disease without crisis: Secondary | ICD-10-CM

## 2023-07-26 MED ORDER — OXYCODONE HCL 10 MG PO TABS
10.0000 mg | ORAL_TABLET | Freq: Four times a day (QID) | ORAL | 0 refills | Status: DC | PRN
Start: 2023-07-30 — End: 2023-08-16

## 2023-07-26 MED ORDER — MORPHINE SULFATE ER 60 MG PO TBCR
60.0000 mg | EXTENDED_RELEASE_TABLET | Freq: Two times a day (BID) | ORAL | 0 refills | Status: DC
Start: 2023-07-30 — End: 2023-08-31

## 2023-07-26 NOTE — Telephone Encounter (Signed)
Prescription Request  07/26/2023  LOV: 06/14/2023  What is the name of the medication or equipment? Morphine 60 mg  Have you contacted your pharmacy to request a refill? Yes   Which pharmacy would you like this sent to?  [id number] - [place], [place] - [address] AT [facility] [address] [place] [address] Phone: [phone] Fax: [phone]    Patient notified that their request is being sent to the clinical staff for review and that they should receive a response within 2 business days.   Please advise at [facility] [phone]

## 2023-07-26 NOTE — Progress Notes (Signed)
Reviewed PDMP substance reporting system prior to prescribing opiate medications. No inconsistencies noted.  Meds ordered this encounter  Medications   morphine (MS CONTIN) 60 MG 12 hr tablet    Sig: Take 1 tablet (60 mg total) by mouth every 12 (twelve) hours.    Dispense:  60 tablet    Refill:  0    Order Specific Question:   Supervising Provider    Answer:   Quentin Angst [[id number]]   Oxycodone HCl 10 MG TABS    Sig: Take 1 tablet (10 mg total) by mouth every 6 (six) hours as needed (pain).    Dispense:  60 tablet    Refill:  0    Order Specific Question:   Supervising Provider    Answer:   Quentin Angst [[id number]]   Nolon Nations  APRN, MSN, FNP-C Patient Care [facility] [address] [place], [place] [address] [phone]

## 2023-07-28 ENCOUNTER — Inpatient Hospital Stay (HOSPITAL_COMMUNITY)
Admission: AD | Admit: 2023-07-28 | Discharge: 2023-07-31 | DRG: 812 | Disposition: A | Discharge status: Home / Self Care | Payer: MEDICAID | Attending: Internal Medicine | Admitting: Internal Medicine

## 2023-07-28 ENCOUNTER — Other Ambulatory Visit: Payer: Self-pay

## 2023-07-28 ENCOUNTER — Encounter (HOSPITAL_COMMUNITY): Payer: Self-pay | Admitting: Emergency Medicine

## 2023-07-28 ENCOUNTER — Emergency Department (HOSPITAL_COMMUNITY)
Admission: EM | Admit: 2023-07-28 | Discharge: 2023-07-28 | Disposition: A | Discharge status: Home / Self Care | Payer: MEDICAID | Source: Home / Self Care | Attending: Emergency Medicine | Admitting: Emergency Medicine

## 2023-07-28 ENCOUNTER — Encounter (HOSPITAL_COMMUNITY): Payer: Self-pay | Admitting: Family Medicine

## 2023-07-28 DIAGNOSIS — Z832 Family history of diseases of the blood and blood-forming organs and certain disorders involving the immune mechanism: Secondary | ICD-10-CM

## 2023-07-28 DIAGNOSIS — D5700 Hb-SS disease with crisis, unspecified: Secondary | ICD-10-CM | POA: Diagnosis present

## 2023-07-28 DIAGNOSIS — D638 Anemia in other chronic diseases classified elsewhere: Secondary | ICD-10-CM | POA: Diagnosis present

## 2023-07-28 DIAGNOSIS — Z9049 Acquired absence of other specified parts of digestive tract: Secondary | ICD-10-CM

## 2023-07-28 DIAGNOSIS — D57219 Sickle-cell/Hb-C disease with crisis, unspecified: Secondary | ICD-10-CM | POA: Insufficient documentation

## 2023-07-28 DIAGNOSIS — Z9081 Acquired absence of spleen: Secondary | ICD-10-CM | POA: Diagnosis not present

## 2023-07-28 DIAGNOSIS — Z79899 Other long term (current) drug therapy: Secondary | ICD-10-CM | POA: Diagnosis not present

## 2023-07-28 DIAGNOSIS — Z8249 Family history of ischemic heart disease and other diseases of the circulatory system: Secondary | ICD-10-CM

## 2023-07-28 DIAGNOSIS — M25551 Pain in right hip: Secondary | ICD-10-CM | POA: Diagnosis present

## 2023-07-28 DIAGNOSIS — G894 Chronic pain syndrome: Secondary | ICD-10-CM | POA: Diagnosis present

## 2023-07-28 DIAGNOSIS — Z885 Allergy status to narcotic agent status: Secondary | ICD-10-CM

## 2023-07-28 DIAGNOSIS — F329 Major depressive disorder, single episode, unspecified: Secondary | ICD-10-CM | POA: Diagnosis present

## 2023-07-28 DIAGNOSIS — M25552 Pain in left hip: Secondary | ICD-10-CM | POA: Insufficient documentation

## 2023-07-28 DIAGNOSIS — F3130 Bipolar disorder, current episode depressed, mild or moderate severity, unspecified: Secondary | ICD-10-CM | POA: Diagnosis present

## 2023-07-28 DIAGNOSIS — E876 Hypokalemia: Secondary | ICD-10-CM | POA: Diagnosis present

## 2023-07-28 LAB — COMPREHENSIVE METABOLIC PANEL
ALT: 17 U/L (ref 0–44)
AST: 22 U/L (ref 15–41)
Albumin: 4.1 g/dL (ref 3.5–5.0)
Alkaline Phosphatase: 62 U/L (ref 38–126)
Anion gap: 9 (ref 5–15)
BUN: 9 mg/dL (ref 6–20)
CO2: 22 mmol/L (ref 22–32)
Calcium: 9.1 mg/dL (ref 8.9–10.3)
Chloride: 108 mmol/L (ref 98–111)
Creatinine, Ser: 0.62 mg/dL (ref 0.44–1.00)
GFR, Estimated: >60 mL/min (ref >60)
Glucose, Bld: 88 mg/dL (ref 70–99)
Potassium: 3.1 mmol/L — ABNORMAL LOW (ref 3.5–5.1)
Sodium: 139 mmol/L (ref 135–145)
Total Bilirubin: 1.2 mg/dL (ref 0.3–1.2)
Total Protein: 8 g/dL (ref 6.5–8.1)

## 2023-07-28 LAB — CBC WITH DIFFERENTIAL/PLATELET
Abs Immature Granulocytes: 0.02 10*3/uL (ref 0.00–0.07)
Basophils Absolute: 0 10*3/uL (ref 0.0–0.1)
Basophils Relative: 1 %
Eosinophils Absolute: 0.1 10*3/uL (ref 0.0–0.5)
Eosinophils Relative: 2 %
HCT: 26.9 % — ABNORMAL LOW (ref 36.0–46.0)
Hemoglobin: 9.4 g/dL — ABNORMAL LOW (ref 12.0–15.0)
Immature Granulocytes: 0 %
Lymphocytes Relative: 41 %
Lymphs Abs: 2.6 10*3/uL (ref 0.7–4.0)
MCH: 29.3 pg (ref 26.0–34.0)
MCHC: 34.9 g/dL (ref 30.0–36.0)
MCV: 83.8 fL (ref 80.0–100.0)
Monocytes Absolute: 0.7 10*3/uL (ref 0.1–1.0)
Monocytes Relative: 11 %
Neutro Abs: 2.9 10*3/uL (ref 1.7–7.7)
Neutrophils Relative %: 45 %
Platelets: 536 10*3/uL — ABNORMAL HIGH (ref 150–400)
RBC: 3.21 MIL/uL — ABNORMAL LOW (ref 3.87–5.11)
RDW: 13.9 % (ref 11.5–15.5)
WBC: 6.4 10*3/uL (ref 4.0–10.5)
nRBC: 0.3 % — ABNORMAL HIGH (ref 0.0–0.2)

## 2023-07-28 LAB — RETICULOCYTES
Immature Retic Fract: 35.2 % — ABNORMAL HIGH (ref 2.3–15.9)
RBC.: 3.18 MIL/uL — ABNORMAL LOW (ref 3.87–5.11)
Retic Count, Absolute: 161.9 10*3/uL (ref 19.0–186.0)
Retic Ct Pct: 5.1 % — ABNORMAL HIGH (ref 0.4–3.1)

## 2023-07-28 MED ORDER — DULOXETINE HCL 60 MG PO CPEP
60.0000 mg | ORAL_CAPSULE | Freq: Every day | ORAL | Status: DC
  Administered 2023-07-29 – 2023-07-31 (×3): 60 mg via ORAL
  Filled 2023-07-28 (×3): qty 1

## 2023-07-28 MED ORDER — FOLIC ACID 1 MG PO TABS
1.0000 mg | ORAL_TABLET | Freq: Every day | ORAL | Status: DC
  Administered 2023-07-29 – 2023-07-31 (×3): 1 mg via ORAL
  Filled 2023-07-28 (×3): qty 1

## 2023-07-28 MED ORDER — KETOROLAC TROMETHAMINE 30 MG/ML IJ SOLN
15.0000 mg | Freq: Once | INTRAMUSCULAR | Status: AC
  Administered 2023-07-28: 15 mg via INTRAVENOUS
  Filled 2023-07-28: qty 1

## 2023-07-28 MED ORDER — ACETAMINOPHEN 500 MG PO TABS
1000.0000 mg | ORAL_TABLET | Freq: Once | ORAL | Status: AC
  Administered 2023-07-28: 1000 mg via ORAL
  Filled 2023-07-28: qty 2

## 2023-07-28 MED ORDER — PNEUMOCOCCAL 20-VAL CONJ VACC 0.5 ML IM SUSY
0.5000 mL | PREFILLED_SYRINGE | INTRAMUSCULAR | Status: DC
  Filled 2023-07-28: qty 0.5

## 2023-07-28 MED ORDER — OXYCODONE HCL 5 MG PO TABS
10.0000 mg | ORAL_TABLET | Freq: Four times a day (QID) | ORAL | Status: DC | PRN
  Administered 2023-07-31: 10 mg via ORAL
  Filled 2023-07-28 (×2): qty 2

## 2023-07-28 MED ORDER — KETOROLAC TROMETHAMINE 15 MG/ML IJ SOLN
15.0000 mg | Freq: Four times a day (QID) | INTRAMUSCULAR | Status: DC
  Administered 2023-07-28 – 2023-07-31 (×10): 15 mg via INTRAVENOUS
  Filled 2023-07-28 (×10): qty 1

## 2023-07-28 MED ORDER — POLYETHYLENE GLYCOL 3350 17 G PO PACK: 17.0000 g | PACK | Freq: Every day | ORAL | Status: DC | PRN

## 2023-07-28 MED ORDER — SODIUM CHLORIDE 0.45 % IV SOLN: INTRAVENOUS | Status: DC

## 2023-07-28 MED ORDER — NALOXONE HCL 0.4 MG/ML IJ SOLN: 0.4000 mg | INTRAMUSCULAR | Status: DC | PRN

## 2023-07-28 MED ORDER — INFLUENZA VIRUS VACC SPLIT PF (FLUZONE) 0.5 ML IM SUSY
0.5000 mL | PREFILLED_SYRINGE | INTRAMUSCULAR | Status: DC
  Filled 2023-07-28: qty 0.5

## 2023-07-28 MED ORDER — ENOXAPARIN SODIUM 40 MG/0.4ML IJ SOSY
40.0000 mg | PREFILLED_SYRINGE | INTRAMUSCULAR | Status: DC
  Filled 2023-07-28: qty 0.4

## 2023-07-28 MED ORDER — HYDROMORPHONE 1 MG/ML IV SOLN
INTRAVENOUS | Status: DC
  Administered 2023-07-28: 30 mg via INTRAVENOUS
  Administered 2023-07-28: 10 mg via INTRAVENOUS
  Administered 2023-07-29: 6.5 mg via INTRAVENOUS
  Administered 2023-07-29: 3 mg via INTRAVENOUS
  Administered 2023-07-29: 5 mg via INTRAVENOUS
  Administered 2023-07-29: 30 mg via INTRAVENOUS
  Administered 2023-07-29: 6.5 mg via INTRAVENOUS
  Administered 2023-07-30: 30 mg via INTRAVENOUS
  Administered 2023-07-30: 2 mg via INTRAVENOUS
  Administered 2023-07-30: 7.5 mg via INTRAVENOUS
  Administered 2023-07-30: 30 mg via INTRAVENOUS
  Administered 2023-07-30: 9 mg via INTRAVENOUS
  Administered 2023-07-30: 5 mg via INTRAVENOUS
  Administered 2023-07-30: 2 mg via INTRAVENOUS
  Administered 2023-07-30: 7 mg via INTRAVENOUS
  Administered 2023-07-30: 5.5 mg via INTRAVENOUS
  Administered 2023-07-31: 8 mg via INTRAVENOUS
  Administered 2023-07-31: 4.5 mg via INTRAVENOUS
  Filled 2023-07-28 (×4): qty 30

## 2023-07-28 MED ORDER — DIPHENHYDRAMINE HCL 25 MG PO CAPS
25.0000 mg | ORAL_CAPSULE | ORAL | Status: DC | PRN
  Administered 2023-07-28: 25 mg via ORAL
  Filled 2023-07-28: qty 1

## 2023-07-28 MED ORDER — MORPHINE SULFATE ER 15 MG PO TBCR
60.0000 mg | EXTENDED_RELEASE_TABLET | Freq: Two times a day (BID) | ORAL | Status: DC
  Administered 2023-07-28 – 2023-07-31 (×6): 60 mg via ORAL
  Filled 2023-07-28 (×6): qty 4

## 2023-07-28 MED ORDER — SENNOSIDES-DOCUSATE SODIUM 8.6-50 MG PO TABS
1.0000 | ORAL_TABLET | Freq: Two times a day (BID) | ORAL | Status: DC
  Administered 2023-07-28 – 2023-07-31 (×6): 1 via ORAL
  Filled 2023-07-28 (×5): qty 1

## 2023-07-28 MED ORDER — SODIUM CHLORIDE 0.9% FLUSH: 9.0000 mL | INTRAVENOUS | Status: DC | PRN

## 2023-07-28 MED ORDER — ONDANSETRON HCL 4 MG/2ML IJ SOLN
4.0000 mg | Freq: Four times a day (QID) | INTRAMUSCULAR | Status: DC | PRN
  Administered 2023-07-28 – 2023-07-29 (×2): 4 mg via INTRAVENOUS
  Filled 2023-07-28 (×2): qty 2

## 2023-07-28 NOTE — ED Provider Notes (Addendum)
[place] EMERGENCY DEPARTMENT AT [facility] Provider Note   CSN: [id number] Arrival date & time: 07/28/23  8295     History  Chief Complaint  Patient presents with   Sickle Cell Pain Crisis    Carmen [name] is a 32 y.o. female.  HPI 32 year old female with SS disease presents today complaining of bilateral hip and thigh pain.  NO numbness, weakness, loss of bowel or bladder. She states that occurred approximately 3 days ago.  No fever, trauma, cough, dyspnea, menses regular, patient does vape.  She reports taking her last oxycodone at 0300 and ms loc.  She normally takes oxycodone q6 hours.      Home Medications Prior to Admission medications   Medication Sig Start Date End Date Taking? Authorizing Provider  cyclobenzaprine (FLEXERIL) 10 MG tablet Take 1 tablet (10 mg total) by mouth at bedtime. TAKE 1 TABLET BY MOUTH DAILY AS NEEDED FOR MUSCLE SPASMS. Patient taking differently: Take 10 mg by mouth at bedtime. 06/14/23   Massie Maroon, FNP  DULoxetine (CYMBALTA) 30 MG capsule Take 2 capsules (60 mg total) by mouth daily. Patient not taking: Reported on 04/22/2023 11/18/22 05/17/23  Massie Maroon, FNP  folic acid (FOLVITE) 1 MG tablet Take 1 tablet (1 mg total) by mouth daily. Patient not taking: Reported on 06/23/2023 03/15/19   Mike Gip, FNP  morphine (MS CONTIN) 60 MG 12 hr tablet Take 1 tablet (60 mg total) by mouth every 12 (twelve) hours. 07/30/23   Massie Maroon, FNP  Oxycodone HCl 10 MG TABS Take 1 tablet (10 mg total) by mouth every 6 (six) hours as needed (pain). 07/30/23   Massie Maroon, FNP  Vitamin D, Ergocalciferol, (DRISDOL) 1.25 MG (50000 UNIT) CAPS capsule TAKE ONE CAPSULE BY MOUTH ONE TIME PER WEEK Patient taking differently: Take 50,000 Units by mouth every Sunday. 08/24/22   Massie Maroon, FNP      Allergies    Ketamine    Review of Systems   Review of Systems  Physical Exam Updated Vital Signs BP (!) 143/78 (BP  Location: Right Arm)   Pulse (!) 104   Temp 97.8 F (36.6 C) (Oral)   Resp 16   Ht 1.6 m (5\' 3" )   Wt 88.5 kg   LMP 07/15/2023 (Exact Date)   SpO2 100%   BMI 34.56 kg/m  Physical Exam Vitals reviewed.  Constitutional:      Appearance: She is normal weight.  HENT:     Head: Normocephalic.     Right Ear: External ear normal.     Left Ear: External ear normal.     Nose: Nose normal.  Eyes:     Extraocular Movements: Extraocular movements intact.     Pupils: Pupils are equal, round, and reactive to light.  Cardiovascular:     Rate and Rhythm: Normal rate and regular rhythm.     Pulses: Normal pulses.     Comments: Patient with hr 90 on my exam Pulmonary:     Effort: Pulmonary effort is normal.  Abdominal:     General: Abdomen is flat.     Palpations: Abdomen is soft.  Musculoskeletal:        General: Normal range of motion.     Cervical back: Normal range of motion.  Skin:    General: Skin is warm and dry.     Capillary Refill: Capillary refill takes less than 2 seconds.  Neurological:     General: No focal deficit  present.     Mental Status: She is alert.  Psychiatric:        Mood and Affect: Mood normal.        Behavior: Behavior normal.     ED Results / Procedures / Treatments   Labs (all labs ordered are listed, but only abnormal results are displayed) Labs Reviewed - No data to display  EKG None  Radiology No results found.  Procedures Procedures    Medications Ordered in ED Medications - No data to display  ED Course/ Medical Decision Making/ A&P                                 Medical Decision Making  Patient with known sickle cell s dz.  Doubt acute crisis.  HR normal, bp high per usual for patient.  Respiratory status normal.  NO fever or infectious sxs.  Prior splenectomy, doubt sequestration. Discussed with [place] Hollis and patient may come to sickle cell clinic.  Patient in agreement with plan to go to sickle cell  clinic.         Final Clinical Impression(s) / ED Diagnoses Final diagnoses:  Sickle cell disease with crisis (HCC)  Bilateral hip pain    Rx / DC Orders ED Discharge Orders     None         Margarita Grizzle, MD 07/28/23 6045    Margarita Grizzle, MD 07/28/23 [phone]

## 2023-07-28 NOTE — Discharge Instructions (Signed)
You were seen and evaluated here in the emergency department today for hip pain and thigh pain.  Please go to the sickle cell clinic. You can return to the emergency department anytime for any worsening symptoms especially fever, chills, chest pain, lightheadedness, or other new symptoms

## 2023-07-28 NOTE — ED Triage Notes (Signed)
Pt arrived pov c/o severe bilateral hip and leg pain, at home PRN pain meds did not help.

## 2023-07-28 NOTE — H&P (Signed)
[facility] History and Physical   Date: 07/28/2023  Patient name: Carmen [name] Medical record number: [id number] Date of birth: 1991/06/17 Age: 32 y.o. Gender: female PCP: Massie Maroon, FNP  Attending physician: Quentin Angst, MD  Chief Complaint: Sickle cell pain   History of Present Illness: Carmen [name] is a 32 year old female with a medical history significant for sickle cell disease, chronic pain syndrome, opiate dependence and tolerance, bipolar depression, and anemia of chronic disease presents with complaints of generalized pain that is consistent with her sickle cell pain crisis. Patient says that pain intensity has been increasing over the past several days and unrelieved by her home medications. Patient last had MS Contin and oxycodone this am without sustained relief. Patient attributes pain crisis to changes in weather.  Patient rates her pain as 10/10. She describes pain as constant and throbbing. She denies headache, fever, chills, chest pain, or shortness of breath. No urinary symptoms, nausea, vomiting, or diarrhea.  No sick contacts or recent travel.  Sickle cell day infusion center course: Vital signs show:BP 113/78 (BP Location: Left Arm)   Pulse 74   Temp 98.3 F (36.8 C) (Oral)   Resp 20   LMP 07/15/2023 (Exact Date)   SpO2 100% .  Complete metabolic panel notable for potassium of 3.1, otherwise unremarkable.  Patient's hemoglobin is 9.4 g/dL, consistent with her baseline.  Patient's pain persists despite IV Dilaudid PCA, IV fluids, IV Toradol, and Tylenol.  Patient will be admitted to [facility] for further management of her sickle cell pain crisis.   Meds: Medications Prior to Admission  Medication Sig Dispense Refill Last Dose   cyclobenzaprine (FLEXERIL) 10 MG tablet Take 1 tablet (10 mg total) by mouth at bedtime. TAKE 1 TABLET BY MOUTH DAILY AS NEEDED FOR MUSCLE SPASMS. (Patient taking differently: Take 10 mg by mouth at bedtime.)  30 tablet 2    DULoxetine (CYMBALTA) 30 MG capsule Take 2 capsules (60 mg total) by mouth daily. (Patient not taking: Reported on 04/22/2023) 180 capsule 1    folic acid (FOLVITE) 1 MG tablet Take 1 tablet (1 mg total) by mouth daily. (Patient not taking: Reported on 06/23/2023) 90 tablet 3    [START ON 07/30/2023] morphine (MS CONTIN) 60 MG 12 hr tablet Take 1 tablet (60 mg total) by mouth every 12 (twelve) hours. 60 tablet 0    [START ON 07/30/2023] Oxycodone HCl 10 MG TABS Take 1 tablet (10 mg total) by mouth every 6 (six) hours as needed (pain). 60 tablet 0    Vitamin D, Ergocalciferol, (DRISDOL) 1.25 MG (50000 UNIT) CAPS capsule TAKE ONE CAPSULE BY MOUTH ONE TIME PER WEEK (Patient taking differently: Take 50,000 Units by mouth every Sunday.) 30 capsule 1     Allergies: Ketamine Past Medical History:  Diagnosis Date   Anxiety    Headache(784.0)    Heart murmur    Sickle cell crisis (HCC)    Syphilis 2015   Was diagnosed and received one injection of antibiotics   Thrombocytosis 11/22/2014     CBC Latest Ref Rng & Units 12/13/2018 12/11/2018 12/10/2018 WBC 4.0 - 10.5 K/uL 14.8(H) 13.2(H) 15.9(H) Hemoglobin 12.0 - 15.0 g/dL 8.2(L) 7.7(L) 8.1(L) Hematocrit 36.0 - 46.0 % 23.7(L) 23.2(L) 24.5(L) Platelets 150 - 400 K/uL [phone]     Past Surgical History:  Procedure Laterality Date   CESAREAN SECTION N/A 02/05/2019   Procedure: CESAREAN SECTION;  Surgeon: Hermina Staggers, MD;  Location: [facility];  Service: Obstetrics;  Laterality: N/A;   CHOLECYSTECTOMY N/A 11/30/2014   Procedure: LAPAROSCOPIC CHOLECYSTECTOMY SINGLE SITE WITH INTRAOPERATIVE CHOLANGIOGRAM;  Surgeon: Karie Soda, MD;  Location: [facility];  Service: General;  Laterality: N/A;   SPLENECTOMY     Family History  Problem Relation Age of Onset   Hypertension Mother    Sickle cell anemia Sister    Kidney disease Sister        Lupus   Arthritis Sister    Sickle cell anemia Sister    Sickle cell trait Sister    Heart disease  Maternal Aunt        CABG   Heart disease Maternal Uncle        CABG   Lupus Sister    Social History   Socioeconomic History   Marital status: Single    Spouse name: Not on file   Number of children: Not on file   Years of education: Not on file   Highest education level: 12th grade  Occupational History   Not on file  Tobacco Use   Smoking status: Some Days    Types: Cigarettes   Smokeless tobacco: Never  Vaping Use   Vaping status: Never Used  Substance and Sexual Activity   Alcohol use: No   Drug use: No   Sexual activity: Yes    Birth control/protection: Injection  Other Topics Concern   Not on file  Social History Narrative   Not on file   Social Determinants of Health   Financial Resource Strain: Low Risk  (03/09/2023)   Overall Financial Resource Strain (CARDIA)    Difficulty of Paying Living Expenses: Not hard at all  Food Insecurity: No Food Insecurity (06/23/2023)   Hunger Vital Sign    Worried About Running Out of Food in the Last Year: Never true    Ran Out of Food in the Last Year: Never true  Transportation Needs: No Transportation Needs (06/23/2023)   PRAPARE - Transportation    Lack of Transportation (Medical): No    Lack of Transportation (Non-Medical): No  Physical Activity: Insufficiently Active (03/09/2023)   Exercise Vital Sign    Days of Exercise per Week: 2 days    Minutes of Exercise per Session: 20 min  Stress: No Stress Concern Present (03/09/2023)   [employer] of Occupational Health - Occupational Stress Questionnaire    Feeling of Stress : Not at all  Social Connections: Moderately Isolated (03/09/2023)   Social Connection and Isolation Panel [NHANES]    Frequency of Communication with Friends and Family: More than three times a week    Frequency of Social Gatherings with Friends and Family: More than three times a week    Attends Religious Services: 1 to 4 times per year    Active Member of [employer] or Organizations: No    Attends Occupational hygienist Meetings: Not on file    Marital Status: Never married  Intimate Partner Violence: Not At Risk (06/23/2023)   Humiliation, Afraid, Rape, and Kick questionnaire    Fear of Current or Ex-Partner: No    Emotionally Abused: No    Physically Abused: No    Sexually Abused: No   Review of Systems  Constitutional: Negative.   HENT: Negative.    Respiratory: Negative.    Cardiovascular: Negative.   Gastrointestinal: Negative.   Genitourinary: Negative.   Musculoskeletal:  Positive for back pain and joint pain.  Skin: Negative.   Neurological: Negative.   Psychiatric/Behavioral: Negative.  Physical Exam: Blood pressure 130/83, pulse 91, temperature 98.4 F (36.9 C), temperature source Temporal, resp. rate 16, last menstrual period 07/15/2023, SpO2 100%. BP 113/78 (BP Location: Left Arm)   Pulse 74   Temp 98.3 F (36.8 C) (Oral)   Resp 20   LMP 07/15/2023 (Exact Date)   SpO2 100%   General Appearance:    Alert, cooperative, no distress, appears stated age  Head:    Normocephalic, without obvious abnormality, atraumatic  Eyes:    PERRL, conjunctiva/corneas clear, EOM's intact, fundi    benign, both eyes  Throat:   Lips, mucosa, and tongue normal; teeth and gums normal  Neck:   Supple, symmetrical, trachea midline, no adenopathy;    thyroid:  no enlargement/tenderness/nodules; no carotid   bruit or JVD  Back:     Symmetric, no curvature, ROM normal, no CVA tenderness  Lungs:     Clear to auscultation bilaterally, respirations unlabored  Chest Wall:    No tenderness or deformity   Heart:    Regular rate and rhythm, S1 and S2 normal, no murmur, rub   or gallop  Abdomen:     Soft, non-tender, bowel sounds active all four quadrants,    no masses, no organomegaly  Extremities:   Extremities normal, atraumatic, no cyanosis or edema  Pulses:   2+ and symmetric all extremities  Skin:   Skin color, texture, turgor normal, no rashes or lesions  Lymph nodes:   Cervical,  supraclavicular, and axillary nodes normal  Neurologic:   CNII-XII intact, normal strength, sensation and reflexes    throughout    Lab results: No results found for this or any previous visit (from the past 24 hour(s)).  Imaging results:  No results found.   Assessment & Plan: Sickle cell disease with pain crisis: Admit.  Continue IV Dilaudid PCA with settings of 0.5 mg, 10-minute lockout, and 3 mg/h. Toradol 15 mg IV every 6 hours IV fluids, 0.45% saline at 100 mL/h Initiate home medications.  MS Contin 60 mg every 12 hours. Oxycodone 10 mg every 6 hours as needed for severe breakthrough pain. Monitor vital signs very closely, reevaluate pain scale regularly, and supplemental oxygen as needed.  Hypokalemia: Replete potassium.  Follow labs in AM.  Anemia of chronic disease: Hemoglobin is stable and consistent with patient's baseline.  There is no clinical indication for blood transfusion at this time.  Monitor closely.  Major depression: Stable.  No suicidal homicidal intentions.  Continue home medications.  Nolon Nations  APRN, MSN, FNP-C Patient Care [facility] [address] [place], [place] [address] [phone]  07/28/2023, 10:50 AM

## 2023-07-28 NOTE — Progress Notes (Signed)
Patient admitted to the day hospital today for sickle cell pain treatment. On arrival, pt rates 9/10 pain to her bilateral hips. Pt received Dilaudid PCA, IV Toradol, IV Zofran, and hydrated with IV fluids via PIV. Pt also received PO Tylenol and Benadryl. Due to ongoing pain, pt is being admitted to inpatient by [place] Hollis, FNP. AT time of transfer, pt rates pain 7/10. Report given to [facility], Charity fundraiser. PCA settings verified with Danella Maiers, RN. At time of transfer, PCA and fluids infusing via PIV. Pt is alert, oriented, and ambulatory at discharge. Pt transferred in wheelchair to [address] [place], bed 7 by RN.

## 2023-07-29 DIAGNOSIS — D5700 Hb-SS disease with crisis, unspecified: Secondary | ICD-10-CM | POA: Diagnosis not present

## 2023-07-29 LAB — BASIC METABOLIC PANEL
Anion gap: 6 (ref 5–15)
BUN: 8 mg/dL (ref 6–20)
CO2: 24 mmol/L (ref 22–32)
Calcium: 8.7 mg/dL — ABNORMAL LOW (ref 8.9–10.3)
Chloride: 105 mmol/L (ref 98–111)
Creatinine, Ser: 0.48 mg/dL (ref 0.44–1.00)
GFR, Estimated: >60 mL/min (ref >60)
Glucose, Bld: 110 mg/dL — ABNORMAL HIGH (ref 70–99)
Potassium: 2.9 mmol/L — ABNORMAL LOW (ref 3.5–5.1)
Sodium: 135 mmol/L (ref 135–145)

## 2023-07-29 LAB — CBC
HCT: 22.7 % — ABNORMAL LOW (ref 36.0–46.0)
Hemoglobin: 8.2 g/dL — ABNORMAL LOW (ref 12.0–15.0)
MCH: 29.6 pg (ref 26.0–34.0)
MCHC: 36.1 g/dL — ABNORMAL HIGH (ref 30.0–36.0)
MCV: 81.9 fL (ref 80.0–100.0)
Platelets: 443 10*3/uL — ABNORMAL HIGH (ref 150–400)
RBC: 2.77 MIL/uL — ABNORMAL LOW (ref 3.87–5.11)
RDW: 14.4 % (ref 11.5–15.5)
WBC: 8.5 10*3/uL (ref 4.0–10.5)
nRBC: 0 % (ref 0.0–0.2)

## 2023-07-29 MED ORDER — SODIUM CHLORIDE 0.9 % IV SOLN
12.5000 mg | Freq: Once | INTRAVENOUS | Status: AC
  Administered 2023-07-29: 12.5 mg via INTRAVENOUS
  Filled 2023-07-29: qty 12.5

## 2023-07-29 MED ORDER — POTASSIUM CHLORIDE CRYS ER 20 MEQ PO TBCR
40.0000 meq | EXTENDED_RELEASE_TABLET | Freq: Every day | ORAL | Status: DC
  Administered 2023-07-30 – 2023-07-31 (×2): 40 meq via ORAL
  Filled 2023-07-29 (×2): qty 2

## 2023-07-29 MED ORDER — POTASSIUM CHLORIDE CRYS ER 20 MEQ PO TBCR
40.0000 meq | EXTENDED_RELEASE_TABLET | Freq: Once | ORAL | Status: AC
  Administered 2023-07-29: 40 meq via ORAL
  Filled 2023-07-29: qty 2

## 2023-07-29 NOTE — Progress Notes (Signed)
Patient requested labs to be draw later this morning.

## 2023-07-29 NOTE — Progress Notes (Signed)
Subjective: Carmen [name] is a 32 year old female with a medical history significant for sickle cell disease, chronic pain syndrome,  opiate dependence and tolerance, anemia of chronic disease, and bipolar depression was admitted for sickle cell pain crisis.  Patient continues to complain of pain to low back, bilateral hips, and lower extremities.  Patient rates pain as 7/10.  Pain persists despite IV Dilaudid PCA.  Patient also endorses some nausea and vomiting.  She denies headache, dizziness, chest pain, or shortness of breath.  Objective:  Vital signs in last 24 hours:  Vitals:   07/29/23 1238 07/29/23 1325 07/29/23 1639 07/29/23 1739  BP:  127/74 122/74   Pulse:  69 (!) 58   Resp: 20 16 18 15   Temp:  98.2 F (36.8 C) 98.6 F (37 C)   TempSrc:  Oral Oral   SpO2: 93% 96% 97% 97%    Intake/Output from previous day:  No intake or output data in the 24 hours ending 07/29/23 1746  Physical Exam: General: Alert, awake, oriented x3, in no acute distress.  HEENT: [place]/AT PEERL, EOMI Neck: Trachea midline,  no masses, no thyromegal,y no JVD, no carotid bruit OROPHARYNX:  Moist, No exudate/ erythema/lesions.  Heart: Regular rate and rhythm, without murmurs, rubs, gallops, PMI non-displaced, no heaves or thrills on palpation.  Lungs: Clear to auscultation, no wheezing or rhonchi noted. No increased vocal fremitus resonant to percussion  Abdomen: Soft, nontender, nondistended, positive bowel sounds, no masses no hepatosplenomegaly noted..  Neuro: No focal neurological deficits noted cranial nerves II through XII grossly intact. DTRs 2+ bilaterally upper and lower extremities. Strength 5 out of 5 in bilateral upper and lower extremities. Musculoskeletal: No warm swelling or erythema around joints, no spinal tenderness noted. Psychiatric: Patient alert and oriented x3, good insight and cognition, good recent to remote recall. Lymph node survey: No cervical axillary or inguinal lymphadenopathy  noted.  Lab Results:  Basic Metabolic Panel:    Component Value Date/Time   NA 135 07/29/2023 0849   NA 145 (H) 06/14/2023 1220   K 2.9 (L) 07/29/2023 0849   CL 105 07/29/2023 0849   CO2 24 07/29/2023 0849   BUN 8 07/29/2023 0849   BUN 9 06/14/2023 1220   CREATININE 0.48 07/29/2023 0849   GLUCOSE 110 (H) 07/29/2023 0849   CALCIUM 8.7 (L) 07/29/2023 0849   CBC:    Component Value Date/Time   WBC 8.5 07/29/2023 0849   HGB 8.2 (L) 07/29/2023 0849   HGB 9.5 (L) 06/14/2023 1220   HCT 22.7 (L) 07/29/2023 0849   HCT 29.0 (L) 06/14/2023 1220   PLT 443 (H) 07/29/2023 0849   PLT 512 (H) 06/14/2023 1220   MCV 81.9 07/29/2023 0849   MCV 91 06/14/2023 1220   NEUTROABS 2.9 07/28/2023 1015   NEUTROABS 2.7 06/14/2023 1220   LYMPHSABS 2.6 07/28/2023 1015   LYMPHSABS 3.3 (H) 06/14/2023 1220   MONOABS 0.7 07/28/2023 1015   EOSABS 0.1 07/28/2023 1015   EOSABS 0.2 06/14/2023 1220   BASOSABS 0.0 07/28/2023 1015   BASOSABS 0.0 06/14/2023 1220    No results found for this or any previous visit (from the past 240 hour(s)).  Studies/Results: No results found.  Medications: Scheduled Meds:  DULoxetine  60 mg Oral Daily   enoxaparin (LOVENOX) injection  40 mg Subcutaneous Q24H   folic acid  1 mg Oral Daily   HYDROmorphone   Intravenous Q4H   influenza vac split trivalent PF  0.5 mL Intramuscular Tomorrow-1000   ketorolac  15 mg Intravenous Q6H   morphine  60 mg Oral Q12H   pneumococcal 20-valent conjugate vaccine  0.5 mL Intramuscular Tomorrow-1000   senna-docusate  1 tablet Oral BID   Continuous Infusions:  sodium chloride 10 mL/hr at 07/29/23 1002   PRN Meds:.diphenhydrAMINE, naloxone **AND** sodium chloride flush, ondansetron (ZOFRAN) IV, oxyCODONE, polyethylene glycol  Consultants: none  Procedures: none  Antibiotics: None  Assessment/Plan: Principal Problem:   Sickle cell pain crisis (HCC) Active Problems:   Anemia of chronic disease   Hypokalemia   Major  depressive disorder   Chronic pain syndrome  Sickle cell disease with pain crisis: Continue IV Dilaudid PCA Decrease IV fluids to [facility] Continue home medications. Toradol 15 mg IV every 6 hours for total of 5 days. Monitor vital signs very closely, reevaluate pain scale regularly, and supplemental oxygen as needed  Chronic pain syndrome: Continue home medications  Hypokalemia: Replete potassium.  Follow labs in AM.  Anemia of chronic disease: Hemoglobin is stable and consistent with patient's baseline.  There are no clinical indications for blood transfusion at this time.  Monitor closely.   Code Status: Full Code Family Communication: N/A Disposition Plan: Not yet ready for discharge.  Discharge planned for 07/30/2023  Nolon Nations  APRN, MSN, FNP-C Patient Care [facility] [address] [place], [place] [address] [phone]  If 7PM-7AM, please contact night-coverage.  07/29/2023, 5:46 PM  LOS: 1 day

## 2023-07-30 DIAGNOSIS — D5700 Hb-SS disease with crisis, unspecified: Secondary | ICD-10-CM | POA: Diagnosis not present

## 2023-07-30 LAB — CBC
HCT: 24.1 % — ABNORMAL LOW (ref 36.0–46.0)
Hemoglobin: 8.7 g/dL — ABNORMAL LOW (ref 12.0–15.0)
MCH: 29.9 pg (ref 26.0–34.0)
MCHC: 36.1 g/dL — ABNORMAL HIGH (ref 30.0–36.0)
MCV: 82.8 fL (ref 80.0–100.0)
Platelets: 406 10*3/uL — ABNORMAL HIGH (ref 150–400)
RBC: 2.91 MIL/uL — ABNORMAL LOW (ref 3.87–5.11)
RDW: 15.3 % (ref 11.5–15.5)
WBC: 10.8 10*3/uL — ABNORMAL HIGH (ref 4.0–10.5)
nRBC: 0.6 % — ABNORMAL HIGH (ref 0.0–0.2)

## 2023-07-30 LAB — BASIC METABOLIC PANEL
Anion gap: 7 (ref 5–15)
BUN: 7 mg/dL (ref 6–20)
CO2: 24 mmol/L (ref 22–32)
Calcium: 9 mg/dL (ref 8.9–10.3)
Chloride: 106 mmol/L (ref 98–111)
Creatinine, Ser: 0.34 mg/dL — ABNORMAL LOW (ref 0.44–1.00)
GFR, Estimated: >60 mL/min (ref >60)
Glucose, Bld: 97 mg/dL (ref 70–99)
Potassium: 4.1 mmol/L (ref 3.5–5.1)
Sodium: 137 mmol/L (ref 135–145)

## 2023-07-30 LAB — MAGNESIUM: Magnesium: 1.9 mg/dL (ref 1.7–2.4)

## 2023-07-31 DIAGNOSIS — D5700 Hb-SS disease with crisis, unspecified: Secondary | ICD-10-CM | POA: Diagnosis not present

## 2023-07-31 NOTE — Progress Notes (Signed)
Subjective: Carmen [name] 32 year old female with a medical history significant for sickle cell disease, chronic pain syndrome, opiate dependence and tolerance, bipolar depression, and anemia of chronic disease was admitted for sickle cell pain crisis. Patient continues to complain of pain to bilateral hips and lower extremities.  Patient endorses vomiting this a.m.  She denies fever, chills, chest pain, or shortness of breath.  No urinary symptoms or diarrhea.  Objective:  Vital signs in last 24 hours:  Vitals:   07/31/23 0513 07/31/23 0657 07/31/23 0735 07/31/23 0954  BP:  123/67  124/71  Pulse:  65  74  Resp: 15 16 16 16   Temp:  99.2 F (37.3 C)  98.9 F (37.2 C)  TempSrc:  Oral  Oral  SpO2: 98% 96% 96% 95%    Intake/Output from previous day:   Intake/Output Summary (Last 24 hours) at 07/31/2023 1240 Last data filed at 07/31/2023 0900 Gross per 24 hour  Intake 2694.67 ml  Output --  Net 2694.67 ml    Physical Exam: General: Alert, awake, oriented x3, in no acute distress.  HEENT: [place]/AT PEERL, EOMI Neck: Trachea midline,  no masses, no thyromegal,y no JVD, no carotid bruit OROPHARYNX:  Moist, No exudate/ erythema/lesions.  Heart: Regular rate and rhythm, without murmurs, rubs, gallops, PMI non-displaced, no heaves or thrills on palpation.  Lungs: Clear to auscultation, no wheezing or rhonchi noted. No increased vocal fremitus resonant to percussion  Abdomen: Soft, nontender, nondistended, positive bowel sounds, no masses no hepatosplenomegaly noted..  Neuro: No focal neurological deficits noted cranial nerves II through XII grossly intact. DTRs 2+ bilaterally upper and lower extremities. Strength 5 out of 5 in bilateral upper and lower extremities. Musculoskeletal: No warm swelling or erythema around joints, no spinal tenderness noted. Psychiatric: Patient alert and oriented x3, good insight and cognition, good recent to remote recall. Lymph node survey: No cervical axillary  or inguinal lymphadenopathy noted.  Lab Results:  Basic Metabolic Panel:    Component Value Date/Time   NA 137 07/30/2023 1138   NA 145 (H) 06/14/2023 1220   K 4.1 07/30/2023 1138   CL 106 07/30/2023 1138   CO2 24 07/30/2023 1138   BUN 7 07/30/2023 1138   BUN 9 06/14/2023 1220   CREATININE 0.34 (L) 07/30/2023 1138   GLUCOSE 97 07/30/2023 1138   CALCIUM 9.0 07/30/2023 1138   CBC:    Component Value Date/Time   WBC 10.8 (H) 07/30/2023 1138   HGB 8.7 (L) 07/30/2023 1138   HGB 9.5 (L) 06/14/2023 1220   HCT 24.1 (L) 07/30/2023 1138   HCT 29.0 (L) 06/14/2023 1220   PLT 406 (H) 07/30/2023 1138   PLT 512 (H) 06/14/2023 1220   MCV 82.8 07/30/2023 1138   MCV 91 06/14/2023 1220   NEUTROABS 2.9 07/28/2023 1015   NEUTROABS 2.7 06/14/2023 1220   LYMPHSABS 2.6 07/28/2023 1015   LYMPHSABS 3.3 (H) 06/14/2023 1220   MONOABS 0.7 07/28/2023 1015   EOSABS 0.1 07/28/2023 1015   EOSABS 0.2 06/14/2023 1220   BASOSABS 0.0 07/28/2023 1015   BASOSABS 0.0 06/14/2023 1220    No results found for this or any previous visit (from the past 240 hour(s)).  Studies/Results: No results found.  Medications: Scheduled Meds:  DULoxetine  60 mg Oral Daily   enoxaparin (LOVENOX) injection  40 mg Subcutaneous Q24H   folic acid  1 mg Oral Daily   HYDROmorphone   Intravenous Q4H   influenza vac split trivalent PF  0.5 mL Intramuscular Tomorrow-1000  ketorolac  15 mg Intravenous Q6H   morphine  60 mg Oral Q12H   pneumococcal 20-valent conjugate vaccine  0.5 mL Intramuscular Tomorrow-1000   potassium chloride SA  40 mEq Oral Daily   senna-docusate  1 tablet Oral BID   Continuous Infusions:  sodium chloride 10 mL/hr at 07/31/23 0747   PRN Meds:.diphenhydrAMINE, naloxone **AND** sodium chloride flush, ondansetron (ZOFRAN) IV, oxyCODONE, polyethylene glycol  Consultants: None  Procedures: None  Antibiotics: None  Assessment/Plan: Principal Problem:   Sickle cell pain crisis (HCC) Active  Problems:   Anemia of chronic disease   Hypokalemia   Major depressive disorder   Chronic pain syndrome  Sickle cell disease with pain crisis: Continue IV Dilaudid PCA Oxycodone 10 mg every 6 hours as needed for severe breakthrough pain MS Contin 60 mg every 12 hours Decrease IV fluids to [facility] Monitor vital signs very closely, reevaluate pain scale regularly, and supplemental oxygen as needed  Hypokalemia: Today, patient's potassium is 2.9.  Replete.  Follow labs in AM.  Anemia of chronic disease: Patient's hemoglobin remained stable and consistent with her baseline.  There is no clinical indication for blood transfusion at this time.  Monitor closely.  Bipolar depression: Stable.  Continue home medications   Code Status: Full Code Family Communication: N/A Disposition Plan: Not yet ready for discharge  Teressa Mcglocklin Rennis Petty  APRN, MSN, FNP-C Patient Care [facility] [address] [place], [place] [address] [phone]   If 7PM-7AM, please contact night-coverage.  07/31/2023, 12:40 PM  LOS: 3 days

## 2023-07-31 NOTE — Discharge Summary (Signed)
Physician Discharge Summary  Carmen [name]:[id number] DOB: 1990-12-20 DOA: 07/28/2023  PCP: Massie Maroon, FNP  Admit date: 07/28/2023  Discharge date: 07/31/2023  Discharge Diagnoses:  Principal Problem:   Sickle cell pain crisis (HCC) Active Problems:   Anemia of chronic disease   Hypokalemia   Major depressive disorder   Chronic pain syndrome   Discharge Condition: Stable  Disposition:   Follow-up Information     Massie Maroon, FNP Follow up.   Specialty: Family Medicine Contact information: [address] [place] [address] [phone]                Pt is discharged home in good condition and is to follow up with Massie Maroon, FNP this week to have labs evaluated. EDESSA JAKUBOWICZ is instructed to increase activity slowly and balance with rest for the next few days, and use prescribed medication to complete treatment of pain  Diet: Regular Wt Readings from Last 3 Encounters:  07/28/23 88.5 kg  06/23/23 88.5 kg  06/14/23 88.5 kg    History of Present Illness: Carmen [name] is a 32 year old female with a medical history significant for sickle cell disease, chronic pain syndrome, opiate dependence and tolerance, bipolar depression, and anemia of chronic disease presents with complaints of generalized pain that is consistent with her sickle cell pain crisis. Patient says that pain intensity has been increasing over the past several days and unrelieved by her home medications. Patient last had MS Contin and oxycodone this am without sustained relief. Patient attributes pain crisis to changes in weather.  Patient rates her pain as 10/10. She describes pain as constant and throbbing. She denies headache, fever, chills, chest pain, or shortness of breath. No urinary symptoms, nausea, vomiting, or diarrhea.  No sick contacts or recent travel.   Sickle cell day infusion center course: Vital signs show:BP 113/78 (BP Location: Left Arm)   Pulse  74   Temp 98.3 F (36.8 C) (Oral)   Resp 20   LMP 07/15/2023 (Exact Date)   SpO2 100% .  Complete metabolic panel notable for potassium of 3.1, otherwise unremarkable.  Patient's hemoglobin is 9.4 g/dL, consistent with her baseline.  Patient's pain persists despite IV Dilaudid PCA, IV fluids, IV Toradol, and Tylenol.  Patient will be admitted to [facility] for further management of her sickle cell pain crisis.    Hospital Course:  Sickle cell disease with pain crisis;  Patient was admitted for sickle cell pain crisis and managed appropriately with IVF, IV Dilaudid via PCA and IV Toradol, as well as other adjunct therapies per sickle cell pain management protocols.  Patient's pain intensity decreased to 5/10 and she is requesting discharge home.  She has a follow-up appointment scheduled.  Patient's labs remained stable.  She will follow-up within 1 week to repeat labs.  Patient was therefore discharged home today in a hemodynamically stable condition.   Breauna will follow-up with PCP within 1 week of this discharge. Kambryn was counseled extensively about nonpharmacologic means of pain management, patient verbalized understanding and was appreciative of  the care received during this admission.   We discussed the need for good hydration, monitoring of hydration status, avoidance of heat, cold, stress, and infection triggers. Patient was reminded of the need to seek medical attention immediately if any symptom of bleeding, anemia, or infection occurs.  Discharge Exam: Vitals:   07/31/23 0735 07/31/23 0954  BP:  124/71  Pulse:  74  Resp: 16 16  Temp:  98.9 F (37.2 C)  SpO2: 96% 95%   Vitals:   07/31/23 0513 07/31/23 0657 07/31/23 0735 07/31/23 0954  BP:  123/67  124/71  Pulse:  65  74  Resp: 15 16 16 16   Temp:  99.2 F (37.3 C)  98.9 F (37.2 C)  TempSrc:  Oral  Oral  SpO2: 98% 96% 96% 95%    General appearance : Awake, alert, not in any distress. Speech Clear. Not toxic  looking HEENT: Atraumatic and Normocephalic, pupils equally reactive to light and accomodation Neck: Supple, no JVD. No cervical lymphadenopathy.  Chest: Good air entry bilaterally, no added sounds  CVS: S1 S2 regular, no murmurs.  Abdomen: Bowel sounds present, Non tender and not distended with no gaurding, rigidity or rebound. Extremities: B/L Lower Ext shows no edema, both legs are warm to touch Neurology: Awake alert, and oriented X 3, CN II-XII intact, Non focal Skin: No Rash  Discharge Instructions  Discharge Instructions     Discharge patient   Complete by: As directed    Discharge disposition: 01-Home or Self Care   Discharge patient date: 07/31/2023      Allergies as of 07/31/2023       Reactions   Ketamine Hives, Nausea And Vomiting, Other (See Comments)   Makes the patient "feel funny" also        Medication List     TAKE these medications    Benadryl Allergy 25 MG capsule Generic drug: diphenhydrAMINE Take 25 mg by mouth every 6 (six) hours as needed for allergies.   cyclobenzaprine 10 MG tablet Commonly known as: FLEXERIL Take 1 tablet (10 mg total) by mouth at bedtime. TAKE 1 TABLET BY MOUTH DAILY AS NEEDED FOR MUSCLE SPASMS. What changed:  when to take this reasons to take this additional instructions   DULoxetine 30 MG capsule Commonly known as: CYMBALTA Take 2 capsules (60 mg total) by mouth daily.   folic acid 1 MG tablet Commonly known as: FOLVITE Take 1 tablet (1 mg total) by mouth daily.   morphine 60 MG 12 hr tablet Commonly known as: MS CONTIN Take 1 tablet (60 mg total) by mouth every 12 (twelve) hours.   Oxycodone HCl 10 MG Tabs Take 1 tablet (10 mg total) by mouth every 6 (six) hours as needed (pain). What changed: when to take this   Vitamin D (Ergocalciferol) 1.25 MG (50000 UNIT) Caps capsule Commonly known as: DRISDOL TAKE ONE CAPSULE BY MOUTH ONE TIME PER WEEK What changed:  how much to take how to take this when to take  this additional instructions        The results of significant diagnostics from this hospitalization (including imaging, microbiology, ancillary and laboratory) are listed below for reference.    Significant Diagnostic Studies: No results found.  Microbiology: No results found for this or any previous visit (from the past 240 hour(s)).   Labs: Basic Metabolic Panel: Recent Labs  Lab 07/28/23 1015 07/29/23 0849 07/30/23 1138  NA 139 135 137  K 3.1* 2.9* 4.1  CL 108 105 106  CO2 22 24 24   GLUCOSE 88 110* 97  BUN 9 8 7   CREATININE 0.62 0.48 0.34*  CALCIUM 9.1 8.7* 9.0  MG  --   --  1.9   Liver Function Tests: Recent Labs  Lab 07/28/23 1015  AST 22  ALT 17  ALKPHOS 62  BILITOT 1.2  PROT 8.0  ALBUMIN 4.1   No results for  input(s): "LIPASE", "AMYLASE" in the last 168 hours. No results for input(s): "AMMONIA" in the last 168 hours. CBC: Recent Labs  Lab 07/28/23 1015 07/29/23 0849 07/30/23 1138  WBC 6.4 8.5 10.8*  NEUTROABS 2.9  --   --   HGB 9.4* 8.2* 8.7*  HCT 26.9* 22.7* 24.1*  MCV 83.8 81.9 82.8  PLT 536* 443* 406*   Cardiac Enzymes: No results for input(s): "CKTOTAL", "CKMB", "CKMBINDEX", "TROPONINI" in the last 168 hours. BNP: Invalid input(s): "POCBNP" CBG: No results for input(s): "GLUCAP" in the last 168 hours.  Time coordinating discharge: 30 minutes  Signed:  Nolon Nations  APRN, MSN, FNP-C Patient Care [facility] [address] [place], [place] [address] [phone]  [facility] 07/31/2023, 11:56 AM

## 2023-08-01 ENCOUNTER — Emergency Department (HOSPITAL_BASED_OUTPATIENT_CLINIC_OR_DEPARTMENT_OTHER): Payer: MEDICAID

## 2023-08-01 ENCOUNTER — Inpatient Hospital Stay (HOSPITAL_COMMUNITY)
Admission: AD | Admit: 2023-08-01 | Discharge: 2023-08-03 | DRG: 812 | Disposition: A | Discharge status: Home / Self Care | Payer: MEDICAID | Source: Ambulatory Visit | Attending: Internal Medicine | Admitting: Internal Medicine

## 2023-08-01 ENCOUNTER — Emergency Department (HOSPITAL_COMMUNITY)
Admission: EM | Admit: 2023-08-01 | Discharge: 2023-08-01 | Disposition: A | Discharge status: Home / Self Care | Payer: MEDICAID | Source: Home / Self Care | Attending: Emergency Medicine | Admitting: Emergency Medicine

## 2023-08-01 ENCOUNTER — Other Ambulatory Visit: Payer: Self-pay

## 2023-08-01 ENCOUNTER — Encounter (HOSPITAL_COMMUNITY): Payer: Self-pay | Admitting: Family Medicine

## 2023-08-01 DIAGNOSIS — Z8249 Family history of ischemic heart disease and other diseases of the circulatory system: Secondary | ICD-10-CM

## 2023-08-01 DIAGNOSIS — D72829 Elevated white blood cell count, unspecified: Secondary | ICD-10-CM | POA: Insufficient documentation

## 2023-08-01 DIAGNOSIS — F329 Major depressive disorder, single episode, unspecified: Secondary | ICD-10-CM | POA: Diagnosis present

## 2023-08-01 DIAGNOSIS — Z9081 Acquired absence of spleen: Secondary | ICD-10-CM | POA: Diagnosis not present

## 2023-08-01 DIAGNOSIS — Z8261 Family history of arthritis: Secondary | ICD-10-CM

## 2023-08-01 DIAGNOSIS — Z8269 Family history of other diseases of the musculoskeletal system and connective tissue: Secondary | ICD-10-CM | POA: Diagnosis not present

## 2023-08-01 DIAGNOSIS — F1120 Opioid dependence, uncomplicated: Secondary | ICD-10-CM | POA: Diagnosis present

## 2023-08-01 DIAGNOSIS — M79661 Pain in right lower leg: Secondary | ICD-10-CM | POA: Diagnosis not present

## 2023-08-01 DIAGNOSIS — D5700 Hb-SS disease with crisis, unspecified: Principal | ICD-10-CM | POA: Diagnosis present

## 2023-08-01 DIAGNOSIS — G894 Chronic pain syndrome: Secondary | ICD-10-CM | POA: Diagnosis present

## 2023-08-01 DIAGNOSIS — Z884 Allergy status to anesthetic agent status: Secondary | ICD-10-CM

## 2023-08-01 DIAGNOSIS — D638 Anemia in other chronic diseases classified elsewhere: Secondary | ICD-10-CM | POA: Diagnosis present

## 2023-08-01 DIAGNOSIS — Z841 Family history of disorders of kidney and ureter: Secondary | ICD-10-CM

## 2023-08-01 DIAGNOSIS — Z832 Family history of diseases of the blood and blood-forming organs and certain disorders involving the immune mechanism: Secondary | ICD-10-CM | POA: Diagnosis not present

## 2023-08-01 DIAGNOSIS — F17210 Nicotine dependence, cigarettes, uncomplicated: Secondary | ICD-10-CM | POA: Diagnosis present

## 2023-08-01 DIAGNOSIS — F419 Anxiety disorder, unspecified: Secondary | ICD-10-CM | POA: Diagnosis present

## 2023-08-01 DIAGNOSIS — Z23 Encounter for immunization: Secondary | ICD-10-CM | POA: Diagnosis not present

## 2023-08-01 DIAGNOSIS — Z79899 Other long term (current) drug therapy: Secondary | ICD-10-CM | POA: Diagnosis not present

## 2023-08-01 LAB — CBC WITH DIFFERENTIAL/PLATELET
Abs Immature Granulocytes: 0.08 10*3/uL — ABNORMAL HIGH (ref 0.00–0.07)
Basophils Absolute: 0.1 10*3/uL (ref 0.0–0.1)
Basophils Relative: 0 %
Eosinophils Absolute: 0 10*3/uL (ref 0.0–0.5)
Eosinophils Relative: 0 %
HCT: 27.6 % — ABNORMAL LOW (ref 36.0–46.0)
Hemoglobin: 9.7 g/dL — ABNORMAL LOW (ref 12.0–15.0)
Immature Granulocytes: 1 %
Lymphocytes Relative: 30 %
Lymphs Abs: 3.9 10*3/uL (ref 0.7–4.0)
MCH: 29.5 pg (ref 26.0–34.0)
MCHC: 35.1 g/dL (ref 30.0–36.0)
MCV: 83.9 fL (ref 80.0–100.0)
Monocytes Absolute: 1.5 10*3/uL — ABNORMAL HIGH (ref 0.1–1.0)
Monocytes Relative: 12 %
Neutro Abs: 7.4 10*3/uL (ref 1.7–7.7)
Neutrophils Relative %: 57 %
Platelets: 431 10*3/uL — ABNORMAL HIGH (ref 150–400)
RBC: 3.29 MIL/uL — ABNORMAL LOW (ref 3.87–5.11)
RDW: 15.9 % — ABNORMAL HIGH (ref 11.5–15.5)
WBC: 12.9 10*3/uL — ABNORMAL HIGH (ref 4.0–10.5)
nRBC: 1.9 % — ABNORMAL HIGH (ref 0.0–0.2)

## 2023-08-01 LAB — URINALYSIS, W/ REFLEX TO CULTURE (INFECTION SUSPECTED)
Bilirubin Urine: NEGATIVE
Glucose, UA: NEGATIVE mg/dL
Hgb urine dipstick: NEGATIVE
Ketones, ur: 80 mg/dL — AB
Leukocytes,Ua: NEGATIVE
Nitrite: NEGATIVE
Protein, ur: NEGATIVE mg/dL
Specific Gravity, Urine: 1.011 (ref 1.005–1.030)
pH: 6 (ref 5.0–8.0)

## 2023-08-01 LAB — COMPREHENSIVE METABOLIC PANEL
ALT: 20 U/L (ref 0–44)
AST: 22 U/L (ref 15–41)
Albumin: 4.3 g/dL (ref 3.5–5.0)
Alkaline Phosphatase: 71 U/L (ref 38–126)
Anion gap: 11 (ref 5–15)
BUN: 7 mg/dL (ref 6–20)
CO2: 23 mmol/L (ref 22–32)
Calcium: 9.8 mg/dL (ref 8.9–10.3)
Chloride: 101 mmol/L (ref 98–111)
Creatinine, Ser: 0.53 mg/dL (ref 0.44–1.00)
GFR, Estimated: >60 mL/min (ref >60)
Glucose, Bld: 91 mg/dL (ref 70–99)
Potassium: 3.7 mmol/L (ref 3.5–5.1)
Sodium: 135 mmol/L (ref 135–145)
Total Bilirubin: 2 mg/dL — ABNORMAL HIGH (ref 0.3–1.2)
Total Protein: 8.2 g/dL — ABNORMAL HIGH (ref 6.5–8.1)

## 2023-08-01 LAB — RETICULOCYTES
Immature Retic Fract: 47.6 % — ABNORMAL HIGH (ref 2.3–15.9)
RBC.: 3.22 MIL/uL — ABNORMAL LOW (ref 3.87–5.11)
Retic Count, Absolute: 250.2 10*3/uL — ABNORMAL HIGH (ref 19.0–186.0)
Retic Ct Pct: 7.8 % — ABNORMAL HIGH (ref 0.4–3.1)

## 2023-08-01 LAB — HCG, SERUM, QUALITATIVE: Preg, Serum: NEGATIVE

## 2023-08-01 MED ORDER — SENNOSIDES-DOCUSATE SODIUM 8.6-50 MG PO TABS
1.0000 | ORAL_TABLET | Freq: Two times a day (BID) | ORAL | Status: DC
  Administered 2023-08-01 – 2023-08-03 (×4): 1 via ORAL
  Filled 2023-08-01 (×4): qty 1

## 2023-08-01 MED ORDER — SODIUM CHLORIDE 0.9% FLUSH: 9.0000 mL | INTRAVENOUS | Status: DC | PRN

## 2023-08-01 MED ORDER — HYDROMORPHONE HCL 2 MG/ML IJ SOLN
2.0000 mg | INTRAMUSCULAR | Status: AC
  Administered 2023-08-01: 2 mg via INTRAVENOUS

## 2023-08-01 MED ORDER — KETOROLAC TROMETHAMINE 15 MG/ML IJ SOLN
15.0000 mg | Freq: Four times a day (QID) | INTRAMUSCULAR | Status: DC
  Administered 2023-08-01 – 2023-08-03 (×7): 15 mg via INTRAVENOUS
  Filled 2023-08-01 (×7): qty 1

## 2023-08-01 MED ORDER — MORPHINE SULFATE ER 15 MG PO TBCR
60.0000 mg | EXTENDED_RELEASE_TABLET | Freq: Two times a day (BID) | ORAL | Status: DC
  Administered 2023-08-01 – 2023-08-03 (×4): 60 mg via ORAL
  Filled 2023-08-01 (×4): qty 4

## 2023-08-01 MED ORDER — HYDROMORPHONE HCL 2 MG/ML IJ SOLN
2.0000 mg | INTRAMUSCULAR | Status: DC
  Filled 2023-08-01: qty 1

## 2023-08-01 MED ORDER — DIPHENHYDRAMINE HCL 25 MG PO CAPS: 25.0000 mg | ORAL_CAPSULE | ORAL | Status: DC | PRN

## 2023-08-01 MED ORDER — ONDANSETRON HCL 4 MG/2ML IJ SOLN
4.0000 mg | Freq: Four times a day (QID) | INTRAMUSCULAR | Status: DC | PRN
  Administered 2023-08-01: 4 mg via INTRAVENOUS
  Filled 2023-08-01: qty 2

## 2023-08-01 MED ORDER — ONDANSETRON HCL 4 MG/2ML IJ SOLN
4.0000 mg | INTRAMUSCULAR | Status: DC | PRN
  Administered 2023-08-01: 4 mg via INTRAVENOUS
  Filled 2023-08-01: qty 2

## 2023-08-01 MED ORDER — ENOXAPARIN SODIUM 40 MG/0.4ML IJ SOSY
40.0000 mg | PREFILLED_SYRINGE | Freq: Every day | INTRAMUSCULAR | Status: DC
  Filled 2023-08-01: qty 0.4

## 2023-08-01 MED ORDER — CYCLOBENZAPRINE HCL 10 MG PO TABS
10.0000 mg | ORAL_TABLET | Freq: Every day | ORAL | Status: DC | PRN
  Administered 2023-08-02: 10 mg via ORAL
  Filled 2023-08-01: qty 1

## 2023-08-01 MED ORDER — HYDROMORPHONE HCL 2 MG/ML IJ SOLN
2.0000 mg | INTRAMUSCULAR | Status: AC
  Administered 2023-08-01: 2 mg via INTRAVENOUS
  Filled 2023-08-01: qty 1

## 2023-08-01 MED ORDER — NALOXONE HCL 0.4 MG/ML IJ SOLN: 0.4000 mg | INTRAMUSCULAR | Status: DC | PRN

## 2023-08-01 MED ORDER — HYDROMORPHONE 1 MG/ML IV SOLN
INTRAVENOUS | Status: DC
  Administered 2023-08-01: 10 mg via INTRAVENOUS
  Administered 2023-08-01: 30 mg via INTRAVENOUS
  Administered 2023-08-02: 11.5 mg via INTRAVENOUS
  Administered 2023-08-02: 30 mg via INTRAVENOUS
  Administered 2023-08-02: 10 mg via INTRAVENOUS
  Administered 2023-08-02: 5 mg via INTRAVENOUS
  Administered 2023-08-02: 30 mg via INTRAVENOUS
  Administered 2023-08-02: 5.5 mg via INTRAVENOUS
  Administered 2023-08-02: 7 mg via INTRAVENOUS
  Administered 2023-08-03: 5.5 mg via INTRAVENOUS
  Filled 2023-08-01 (×3): qty 30

## 2023-08-01 MED ORDER — KETOROLAC TROMETHAMINE 30 MG/ML IJ SOLN
15.0000 mg | Freq: Once | INTRAMUSCULAR | Status: AC
  Administered 2023-08-01: 15 mg via INTRAVENOUS
  Filled 2023-08-01: qty 1

## 2023-08-01 MED ORDER — POLYETHYLENE GLYCOL 3350 17 G PO PACK: 17.0000 g | PACK | Freq: Every day | ORAL | Status: DC | PRN

## 2023-08-01 MED ORDER — ACETAMINOPHEN 500 MG PO TABS
1000.0000 mg | ORAL_TABLET | Freq: Once | ORAL | Status: AC
  Administered 2023-08-01: 1000 mg via ORAL
  Filled 2023-08-01: qty 2

## 2023-08-01 MED ORDER — FOLIC ACID 1 MG PO TABS
1.0000 mg | ORAL_TABLET | Freq: Every day | ORAL | Status: DC
  Administered 2023-08-02 – 2023-08-03 (×2): 1 mg via ORAL
  Filled 2023-08-01 (×2): qty 1

## 2023-08-01 MED ORDER — SODIUM CHLORIDE 0.9 % IV SOLN
12.5000 mg | Freq: Once | INTRAVENOUS | Status: AC
  Administered 2023-08-01: 12.5 mg via INTRAVENOUS
  Filled 2023-08-01: qty 12.5

## 2023-08-01 MED ORDER — DULOXETINE HCL 60 MG PO CPEP
60.0000 mg | ORAL_CAPSULE | Freq: Every day | ORAL | Status: DC
  Administered 2023-08-02 – 2023-08-03 (×2): 60 mg via ORAL
  Filled 2023-08-01 (×2): qty 1

## 2023-08-01 MED ORDER — SODIUM CHLORIDE 0.45 % IV SOLN: INTRAVENOUS | Status: DC

## 2023-08-01 NOTE — Discharge Instructions (Signed)
It was a pleasure caring for you today. As discussed, you will need to follow up with the sickle cell clinic today. Seek emergency care if experiencing any new or worsening symptoms.

## 2023-08-01 NOTE — H&P (Signed)
[facility] History and Physical   Date: 08/01/2023  Patient name: Carmen [name] Medical record number: [id number] Date of birth: [birth year] Age: 32 y.o. Gender: female PCP: Massie Maroon, FNP  Attending physician: Quentin Angst, MD  Chief Complaint: Sickle cell pain   History of Present Illness: Carmen [name] is a 32 year old female with a medical history significant for sickle cell disease, chronic pain syndrome, opiate dependence and tolerance, anemia of chronic disease, and major depression presents with complaints of bilateral lower extremity pain that is consistent with previous sickle cell pain crisis.  Patient was evaluated in the emergency department for this problem.  Agreed with EDP that patient was appropriate to transition to sickle cell day infusion clinic for further management of her sickle cell crisis.  Patient was discharged from inpatient admission on yesterday.  She states that overnight pain intensity increased especially in her right lower extremity.  She states that pain was constant and sharp and uncontrolled by her home medications, therefore she returned to the emergency department for evaluation.  Patient states that pain intensity has not improved despite IV Dilaudid, IV Toradol, and IV fluids.  Patient rates her pain as 10/10.  She denies headache, chest pain, urinary symptoms, nausea, vomiting, or diarrhea. Reviewed all laboratory values, consistent with patient's baseline without any changes from previous.  Sickle cell day infusion center course: Vital signs remained stable during day hospital admission.  IV Dilaudid PCA, IV Toradol, Tylenol, and IV fluids were initiated, patient's pain did not improve throughout admission.  She will be readmitted to [facility] for further management of her sickle cell crisis.  Meds: Medications Prior to Admission  Medication Sig Dispense Refill Last Dose   BENADRYL ALLERGY 25 MG capsule Take 25  mg by mouth every 6 (six) hours as needed for allergies.      cyclobenzaprine (FLEXERIL) 10 MG tablet Take 1 tablet (10 mg total) by mouth at bedtime. TAKE 1 TABLET BY MOUTH DAILY AS NEEDED FOR MUSCLE SPASMS. (Patient taking differently: Take 10 mg by mouth daily as needed for muscle spasms.) 30 tablet 2    DULoxetine (CYMBALTA) 30 MG capsule Take 2 capsules (60 mg total) by mouth daily. (Patient not taking: Reported on 07/29/2023) 180 capsule 1    folic acid (FOLVITE) 1 MG tablet Take 1 tablet (1 mg total) by mouth daily. (Patient not taking: Reported on 06/23/2023) 90 tablet 3    morphine (MS CONTIN) 60 MG 12 hr tablet Take 1 tablet (60 mg total) by mouth every 12 (twelve) hours. 60 tablet 0    Oxycodone HCl 10 MG TABS Take 1 tablet (10 mg total) by mouth every 6 (six) hours as needed (pain). (Patient taking differently: Take 10 mg by mouth every 6 (six) hours.) 60 tablet 0    Vitamin D, Ergocalciferol, (DRISDOL) 1.25 MG (50000 UNIT) CAPS capsule TAKE ONE CAPSULE BY MOUTH ONE TIME PER WEEK (Patient taking differently: Take 50,000 Units by mouth every Monday.) 30 capsule 1     Allergies: Ketamine Past Medical History:  Diagnosis Date   Anxiety    Headache(784.0)    Heart murmur    Sickle cell crisis (HCC)    Syphilis 2015   Was diagnosed and received one injection of antibiotics   Thrombocytosis 11/22/2014     CBC Latest Ref Rng & Units 12/13/2018 12/11/2018 12/10/2018 WBC 4.0 - 10.5 K/uL 14.8(H) 13.2(H) 15.9(H) Hemoglobin 12.0 - 15.0 g/dL 8.2(L) 7.7(L) 8.1(L) Hematocrit 36.0 -  46.0 % 23.7(L) 23.2(L) 24.5(L) Platelets 150 - 400 K/uL 326 399 388     Past Surgical History:  Procedure Laterality Date   CESAREAN SECTION N/A 02/05/2019   Procedure: CESAREAN SECTION;  Surgeon: Hermina Staggers, MD;  Location: [facility] LD ORS;  Service: Obstetrics;  Laterality: N/A;   CHOLECYSTECTOMY N/A 11/30/2014   Procedure: LAPAROSCOPIC CHOLECYSTECTOMY SINGLE SITE WITH INTRAOPERATIVE CHOLANGIOGRAM;  Surgeon: Karie Soda,  MD;  Location: [facility] ORS;  Service: General;  Laterality: N/A;   SPLENECTOMY     Family History  Problem Relation Age of Onset   Hypertension Mother    Sickle cell anemia Sister    Kidney disease Sister        Lupus   Arthritis Sister    Sickle cell anemia Sister    Sickle cell trait Sister    Heart disease Maternal Aunt        CABG   Heart disease Maternal Uncle        CABG   Lupus Sister    Social History   Socioeconomic History   Marital status: Single    Spouse name: Not on file   Number of children: Not on file   Years of education: Not on file   Highest education level: 12th grade  Occupational History   Not on file  Tobacco Use   Smoking status: Some Days    Types: Cigarettes   Smokeless tobacco: Never  Vaping Use   Vaping status: Never Used  Substance and Sexual Activity   Alcohol use: No   Drug use: No   Sexual activity: Yes    Birth control/protection: Injection  Other Topics Concern   Not on file  Social History Narrative   Not on file   Social Determinants of Health   Financial Resource Strain: Low Risk  (03/09/2023)   Overall Financial Resource Strain (CARDIA)    Difficulty of Paying Living Expenses: Not hard at all  Food Insecurity: No Food Insecurity (07/28/2023)   Hunger Vital Sign    Worried About Running Out of Food in the Last Year: Never true    Ran Out of Food in the Last Year: Never true  Transportation Needs: No Transportation Needs (07/28/2023)   PRAPARE - Administrator, Civil Service (Medical): No    Lack of Transportation (Non-Medical): No  Physical Activity: Insufficiently Active (03/09/2023)   Exercise Vital Sign    Days of Exercise per Week: 2 days    Minutes of Exercise per Session: 20 min  Stress: No Stress Concern Present (03/09/2023)   [employer] of Occupational Health - Occupational Stress Questionnaire    Feeling of Stress : Not at all  Social Connections: Moderately Isolated (03/09/2023)   Social Connection and  Isolation Panel [NHANES]    Frequency of Communication with Friends and Family: More than three times a week    Frequency of Social Gatherings with Friends and Family: More than three times a week    Attends Religious Services: 1 to 4 times per year    Active Member of [employer] or Organizations: No    Attends Banker Meetings: Not on file    Marital Status: Never married  Intimate Partner Violence: Not At Risk (07/28/2023)   Humiliation, Afraid, Rape, and Kick questionnaire    Fear of Current or Ex-Partner: No    Emotionally Abused: No    Physically Abused: No    Sexually Abused: No   Review of Systems  Constitutional: Negative.  HENT: Negative.    Eyes: Negative.   Respiratory: Negative.    Cardiovascular: Negative.   Gastrointestinal:  Positive for constipation.  Genitourinary: Negative.   Musculoskeletal:  Positive for back pain and joint pain.  Skin: Negative.   Neurological: Negative.   Psychiatric/Behavioral: Negative.      Physical Exam: Blood pressure 138/85, pulse 93, temperature 98.6 F (37 C), temperature source Oral, resp. rate 16, last menstrual period 07/15/2023, SpO2 96%. Physical Exam Constitutional:      Appearance: Normal appearance.  Eyes:     Pupils: Pupils are equal, round, and reactive to light.  Cardiovascular:     Rate and Rhythm: Normal rate and regular rhythm.     Pulses: Normal pulses.  Pulmonary:     Effort: Pulmonary effort is normal.  Abdominal:     General: Bowel sounds are normal.  Skin:    General: Skin is warm.  Neurological:     General: No focal deficit present.     Mental Status: She is alert. Mental status is at baseline.  Psychiatric:        Mood and Affect: Mood normal.        Behavior: Behavior normal.        Thought Content: Thought content normal.        Judgment: Judgment normal.      Lab results: Results for orders placed or performed during the hospital encounter of 08/01/23 (from the past 24 hour(s))   CBC WITH DIFFERENTIAL     Status: Abnormal   Collection Time: 08/01/23  8:10 AM  Result Value Ref Range   WBC 12.9 (H) 4.0 - 10.5 K/uL   RBC 3.29 (L) 3.87 - 5.11 MIL/uL   Hemoglobin 9.7 (L) 12.0 - 15.0 g/dL   HCT 30.8 (L) 65.7 - 84.6 %   MCV 83.9 80.0 - 100.0 fL   MCH 29.5 26.0 - 34.0 pg   MCHC 35.1 30.0 - 36.0 g/dL   RDW 96.2 (H) 95.2 - 84.1 %   Platelets 431 (H) 150 - 400 K/uL   nRBC 1.9 (H) 0.0 - 0.2 %   Neutrophils Relative % 57 %   Neutro Abs 7.4 1.7 - 7.7 K/uL   Lymphocytes Relative 30 %   Lymphs Abs 3.9 0.7 - 4.0 K/uL   Monocytes Relative 12 %   Monocytes Absolute 1.5 (H) 0.1 - 1.0 K/uL   Eosinophils Relative 0 %   Eosinophils Absolute 0.0 0.0 - 0.5 K/uL   Basophils Relative 0 %   Basophils Absolute 0.1 0.0 - 0.1 K/uL   Immature Granulocytes 1 %   Abs Immature Granulocytes 0.08 (H) 0.00 - 0.07 K/uL  Reticulocytes     Status: Abnormal   Collection Time: 08/01/23  8:10 AM  Result Value Ref Range   Retic Ct Pct 7.8 (H) 0.4 - 3.1 %   RBC. 3.22 (L) 3.87 - 5.11 MIL/uL   Retic Count, Absolute 250.2 (H) 19.0 - 186.0 K/uL   Immature Retic Fract 47.6 (H) 2.3 - 15.9 %  Comprehensive metabolic panel     Status: Abnormal   Collection Time: 08/01/23  8:10 AM  Result Value Ref Range   Sodium 135 135 - 145 mmol/L   Potassium 3.7 3.5 - 5.1 mmol/L   Chloride 101 98 - 111 mmol/L   CO2 23 22 - 32 mmol/L   Glucose, Bld 91 70 - 99 mg/dL   BUN 7 6 - 20 mg/dL   Creatinine, Ser 3.24 0.44 - 1.00 mg/dL  Calcium 9.8 8.9 - 10.3 mg/dL   Total Protein 8.2 (H) 6.5 - 8.1 g/dL   Albumin 4.3 3.5 - 5.0 g/dL   AST 22 15 - 41 U/L   ALT 20 0 - 44 U/L   Alkaline Phosphatase 71 38 - 126 U/L   Total Bilirubin 2.0 (H) 0.3 - 1.2 mg/dL   GFR, Estimated >16 >10 mL/min   Anion gap 11 5 - 15  hCG, serum, qualitative     Status: None   Collection Time: 08/01/23  8:10 AM  Result Value Ref Range   Preg, Serum NEGATIVE NEGATIVE  Urinalysis, w/ Reflex to Culture (Infection Suspected) -Urine, Clean Catch      Status: Abnormal   Collection Time: 08/01/23  9:29 AM  Result Value Ref Range   Specimen Source URINE, CLEAN CATCH    Color, Urine YELLOW YELLOW   APPearance HAZY (A) CLEAR   Specific Gravity, Urine 1.011 1.005 - 1.030   pH 6.0 5.0 - 8.0   Glucose, UA NEGATIVE NEGATIVE mg/dL   Hgb urine dipstick NEGATIVE NEGATIVE   Bilirubin Urine NEGATIVE NEGATIVE   Ketones, ur 80 (A) NEGATIVE mg/dL   Protein, ur NEGATIVE NEGATIVE mg/dL   Nitrite NEGATIVE NEGATIVE   Leukocytes,Ua NEGATIVE NEGATIVE   RBC / HPF 0-5 0 - 5 RBC/hpf   WBC, UA 0-5 0 - 5 WBC/hpf   Bacteria, UA RARE (A) NONE SEEN   Squamous Epithelial / HPF 0-5 0 - 5 /HPF   Mucus PRESENT     Imaging results:  VAS [place] LOWER EXTREMITY VENOUS (DVT) (7a-7p)  Result Date: 08/01/2023  Lower Venous DVT Study Patient Name:  TAMMY WICKLIFFE  Date of Exam:   08/01/2023 Medical Rec #: [id number]       Accession #:    [id number] Date of Birth: 09-11-1991       Patient Gender: F Patient Age:   32 years Exam Location:  [facility] Procedure:      VAS [place] LOWER EXTREMITY VENOUS (DVT) Referring Phys: [facility]ED[facility] --------------------------------------------------------------------------------  Indications: Pain. Other Indications: Sickle Cell Crisis. Comparison Study: Previous exam on 11/27/2012 was negative for DVT Performing Technologist: Ernestene Mention RVT, RDMS  Examination Guidelines: A complete evaluation includes B-mode imaging, spectral Doppler, color Doppler, and power Doppler as needed of all accessible portions of each vessel. Bilateral testing is considered an integral part of a complete examination. Limited examinations for reoccurring indications may be performed as noted. The reflux portion of the exam is performed with the patient in reverse Trendelenburg.  +---------+---------------+---------+-----------+----------+--------------+ RIGHT    CompressibilityPhasicitySpontaneityPropertiesThrombus Aging  +---------+---------------+---------+-----------+----------+--------------+ CFV      Full           Yes      Yes                                 +---------+---------------+---------+-----------+----------+--------------+ SFJ      Full                                                        +---------+---------------+---------+-----------+----------+--------------+ FV Prox  Full           Yes      Yes                                 +---------+---------------+---------+-----------+----------+--------------+  FV Mid   Full           Yes      Yes                                 +---------+---------------+---------+-----------+----------+--------------+ FV DistalFull           Yes      Yes                                 +---------+---------------+---------+-----------+----------+--------------+ PFV      Full                                                        +---------+---------------+---------+-----------+----------+--------------+ POP      Full           Yes      Yes                                 +---------+---------------+---------+-----------+----------+--------------+ PTV      Full                                                        +---------+---------------+---------+-----------+----------+--------------+ PERO     Full                                                        +---------+---------------+---------+-----------+----------+--------------+   +----+---------------+---------+-----------+----------+--------------+ LEFTCompressibilityPhasicitySpontaneityPropertiesThrombus Aging +----+---------------+---------+-----------+----------+--------------+ CFV Full           Yes      Yes                                 +----+---------------+---------+-----------+----------+--------------+     Summary: RIGHT: - There is no evidence of deep vein thrombosis in the lower extremity.  - No cystic structure found in the popliteal fossa.   LEFT: - No evidence of common femoral vein obstruction.   *See table(s) above for measurements and observations.    Preliminary      Assessment & Plan: Sickle cell disease with pain crisis: Patient admitted to sickle cell day infusion center for management of pain crisis. Improved throughout admission and patient transition making admission.  Patient is opiate tolerant  IV dilaudid PCA.  IV fluids, 0.45% saline at 100 ml/hr Toradol 15 mg IV times one dose Tylenol 1000 mg by mouth times one dose Pain intensity will be reevaluated in context of functioning and relationship to baseline as care progresses If pain intensity remains elevated and/or sudden change in hemodynamic stability transition to inpatient services for higher level of care.   Chronic pain syndrome: Continue home medications  Anemia of chronic disease: Patient's hemoglobin remained stable and consistent with her baseline.  There is no clinical indication for blood transfusion at this time.  Major depression: Continue patient's home medications.  Stable.  Follow closely.    Nolon Nations  APRN, MSN, FNP-C Patient Care [facility] [address] [place], [place] [address] [phone]  08/01/2023, 11:52 AM

## 2023-08-01 NOTE — ED Triage Notes (Signed)
Pt BIBA from home for ongoing SCC, seen yesterday and was going to be admitted but left to be with son and try managing at home. Regular pain meds not helping. Asked for O2 to help with pain, EMS put on 3L [place]  148/108 HR 76 98% RA

## 2023-08-01 NOTE — Progress Notes (Signed)
Patient admitted to the day hospital from [facility] for sickle cell pain. Initially, patient reported 10/10 right leg pain. For pain management, patient placed on Sickle Cell Dose Dilaudid PCA with 1 mg loading dose, given IV Toradol, PO Tylenol and hydrated with IV fluids. Patient's pain remained elevated at 9/10. Provider notified and placed orders for admission to inpatient unit for continued pain management. Report called to Meg, RN on [address]. Patient transferred to [address] in wheelchair with PCA (setting 0.5/10/3 verified with Jana Half, RN prior to transfer). Vital signs wnl. Patient alert, oriented and stable at transfer.

## 2023-08-01 NOTE — Progress Notes (Signed)
RLE venous duplex has been completed.  Preliminary results given to [facility], PA-C.    Results can be found under chart review under CV PROC. 08/01/2023 9:27 AM Marcus Schwandt RVT, RDMS

## 2023-08-01 NOTE — ED Provider Notes (Signed)
[place] EMERGENCY DEPARTMENT AT [facility] Provider Note   CSN: [id number] Arrival date & time: 08/01/23  0602     History  Chief Complaint  Patient presents with   Sickle Cell Pain Crisis    Carmen [name] is a 32 y.o. female with PMHx headache, anxiety, sickle cell anemia who presents to ED concerned for right leg pain. Pain has been increasing over the past 4 days. Patient stating that she was admitted to sickle cell unit yesterday for a pain crisis, but thought that she could handle her pain with home medications so she asked to go home. Patient woke up this morning with severe right leg pain - so came back requesting to be admitted for sickle cell pain crisis. No trauma to right leg.   Denies fever, chest pain, dyspnea, cough, nausea, vomiting, diarrhea, dysuria, hematuria.    Sickle Cell Pain Crisis      Home Medications Prior to Admission medications   Medication Sig Start Date End Date Taking? Authorizing Provider  BENADRYL ALLERGY 25 MG capsule Take 25 mg by mouth every 6 (six) hours as needed for allergies.    [provider]  cyclobenzaprine (FLEXERIL) 10 MG tablet Take 1 tablet (10 mg total) by mouth at bedtime. TAKE 1 TABLET BY MOUTH DAILY AS NEEDED FOR MUSCLE SPASMS. Patient taking differently: Take 10 mg by mouth daily as needed for muscle spasms. 06/14/23   Massie Maroon, FNP  DULoxetine (CYMBALTA) 30 MG capsule Take 2 capsules (60 mg total) by mouth daily. Patient not taking: Reported on 07/29/2023 11/18/22 07/29/23  Massie Maroon, FNP  folic acid (FOLVITE) 1 MG tablet Take 1 tablet (1 mg total) by mouth daily. Patient not taking: Reported on 06/23/2023 03/15/19   Mike Gip, FNP  morphine (MS CONTIN) 60 MG 12 hr tablet Take 1 tablet (60 mg total) by mouth every 12 (twelve) hours. 07/30/23   Massie Maroon, FNP  Oxycodone HCl 10 MG TABS Take 1 tablet (10 mg total) by mouth every 6 (six) hours as needed (pain). Patient taking  differently: Take 10 mg by mouth every 6 (six) hours. 07/30/23   Massie Maroon, FNP  Vitamin D, Ergocalciferol, (DRISDOL) 1.25 MG (50000 UNIT) CAPS capsule TAKE ONE CAPSULE BY MOUTH ONE TIME PER WEEK Patient taking differently: Take 50,000 Units by mouth every Monday. 08/24/22   Massie Maroon, FNP      Allergies    Ketamine    Review of Systems   Review of Systems  Musculoskeletal:        Leg pain    Physical Exam Updated Vital Signs BP 138/88 (BP Location: Right Arm)   Pulse 82   Temp 98.2 F (36.8 C) (Oral)   Resp 18   Ht 5\' 3"  (1.6 m)   Wt 86.2 kg   LMP 07/15/2023 (Exact Date)   SpO2 100%   BMI 33.66 kg/m  Physical Exam Vitals and nursing note reviewed.  Constitutional:      General: She is not in acute distress.    Appearance: She is not ill-appearing or toxic-appearing.  HENT:     Head: Normocephalic and atraumatic.     Mouth/Throat:     Mouth: Mucous membranes are moist.  Eyes:     General: No scleral icterus.       Right eye: No discharge.        Left eye: No discharge.     Conjunctiva/sclera: Conjunctivae normal.  Cardiovascular:  Rate and Rhythm: Normal rate and regular rhythm.     Pulses: Normal pulses.     Heart sounds: Normal heart sounds. No murmur heard. Pulmonary:     Effort: Pulmonary effort is normal. No respiratory distress.     Breath sounds: Normal breath sounds. No wheezing, rhonchi or rales.  Abdominal:     General: Abdomen is flat. Bowel sounds are normal. There is no distension.     Palpations: Abdomen is soft. There is no mass.     Tenderness: There is no abdominal tenderness.  Musculoskeletal:     Right lower leg: No edema.     Left lower leg: No edema.     Comments: +2 pedal pulses BL. No calf tenderness to palpation. Sensation to light touch intact. No swelling or erythema of LE.  Skin:    General: Skin is warm and dry.     Findings: No rash.  Neurological:     General: No focal deficit present.     Mental Status: She  is alert. Mental status is at baseline.  Psychiatric:        Mood and Affect: Mood normal.        Behavior: Behavior normal.     ED Results / Procedures / Treatments   Labs (all labs ordered are listed, but only abnormal results are displayed) Labs Reviewed  CBC WITH DIFFERENTIAL/PLATELET - Abnormal; Notable for the following components:      Result Value   WBC 12.9 (*)    RBC 3.29 (*)    Hemoglobin 9.7 (*)    HCT 27.6 (*)    RDW 15.9 (*)    Platelets 431 (*)    nRBC 1.9 (*)    Monocytes Absolute 1.5 (*)    Abs Immature Granulocytes 0.08 (*)    All other components within normal limits  RETICULOCYTES - Abnormal; Notable for the following components:   Retic Ct Pct 7.8 (*)    RBC. 3.22 (*)    Retic Count, Absolute 250.2 (*)    Immature Retic Fract 47.6 (*)    All other components within normal limits  COMPREHENSIVE METABOLIC PANEL - Abnormal; Notable for the following components:   Total Protein 8.2 (*)    Total Bilirubin 2.0 (*)    All other components within normal limits  URINALYSIS, W/ REFLEX TO CULTURE (INFECTION SUSPECTED) - Abnormal; Notable for the following components:   APPearance HAZY (*)    Ketones, ur 80 (*)    Bacteria, UA RARE (*)    All other components within normal limits  HCG, SERUM, QUALITATIVE    EKG None  Radiology VAS [place] LOWER EXTREMITY VENOUS (DVT) (7a-7p)  Result Date: 08/01/2023  Lower Venous DVT Study Patient Name:  Carmen [name]  Date of Exam:   08/01/2023 Medical Rec #: [id number]       Accession #:    [id number] Date of Birth: January 12, 1991       Patient Gender: F Patient Age:   32 years Exam Location:  [facility] Procedure:      VAS [place] LOWER EXTREMITY VENOUS (DVT) Referring Phys: [facility] Lisvet Rasheed --------------------------------------------------------------------------------  Indications: Pain. Other Indications: Sickle Cell Crisis. Comparison Study: Previous exam on 11/27/2012 was negative for DVT Performing Technologist: Ernestene Mention  RVT, RDMS  Examination Guidelines: A complete evaluation includes B-mode imaging, spectral Doppler, color Doppler, and power Doppler as needed of all accessible portions of each vessel. Bilateral testing is considered an integral part of a complete examination. Limited examinations  for reoccurring indications may be performed as noted. The reflux portion of the exam is performed with the patient in reverse Trendelenburg.  +---------+---------------+---------+-----------+----------+--------------+ RIGHT    CompressibilityPhasicitySpontaneityPropertiesThrombus Aging +---------+---------------+---------+-----------+----------+--------------+ CFV      Full           Yes      Yes                                 +---------+---------------+---------+-----------+----------+--------------+ SFJ      Full                                                        +---------+---------------+---------+-----------+----------+--------------+ FV Prox  Full           Yes      Yes                                 +---------+---------------+---------+-----------+----------+--------------+ FV Mid   Full           Yes      Yes                                 +---------+---------------+---------+-----------+----------+--------------+ FV DistalFull           Yes      Yes                                 +---------+---------------+---------+-----------+----------+--------------+ PFV      Full                                                        +---------+---------------+---------+-----------+----------+--------------+ POP      Full           Yes      Yes                                 +---------+---------------+---------+-----------+----------+--------------+ PTV      Full                                                        +---------+---------------+---------+-----------+----------+--------------+ PERO     Full                                                         +---------+---------------+---------+-----------+----------+--------------+   +----+---------------+---------+-----------+----------+--------------+ LEFTCompressibilityPhasicitySpontaneityPropertiesThrombus Aging +----+---------------+---------+-----------+----------+--------------+ CFV Full           Yes      Yes                                 +----+---------------+---------+-----------+----------+--------------+  Summary: RIGHT: - There is no evidence of deep vein thrombosis in the lower extremity.  - No cystic structure found in the popliteal fossa.  LEFT: - No evidence of common femoral vein obstruction.   *See table(s) above for measurements and observations.    Preliminary     Procedures Procedures    Medications Ordered in ED Medications  ondansetron (ZOFRAN) injection 4 mg (4 mg Intravenous Given 08/01/23 0808)  HYDROmorphone (DILAUDID) injection 2 mg (2 mg Intravenous Given 08/01/23 0807)  HYDROmorphone (DILAUDID) injection 2 mg (2 mg Intravenous Given 08/01/23 0844)  diphenhydrAMINE (BENADRYL) 12.5 mg in sodium chloride 0.9 % 50 mL IVPB (0 mg Intravenous Stopped 08/01/23 0854)  HYDROmorphone (DILAUDID) injection 2 mg (2 mg Intravenous Given 08/01/23 0981)    ED Course/ Medical Decision Making/ A&P                                 Medical Decision Making  This patient presents to the ED for concern of sickle cell pain, this involves an extensive number of treatment options, and is a complaint that carries with it a high risk of complications and morbidity.  The differential diagnosis includes sickle cell pain crisis, Anemia, aplastic crisis, acute chest syndrome/fat embolism, AKI, VTE.   Co morbidities that complicate the patient evaluation  headache, anxiety, sickle cell anemia   Additional history obtained:  Patient requesting discharge yesterday thinking that she could handle her pain with outpatient medications. Patient with follow up appointment with [facility]  FNP later this week.   Lab Tests:  I Ordered, and personally interpreted labs.  The pertinent results include:   CBC with differential: WBC elevated more than baseline at 12.9; anemia at 9.7 at patient's baseline CMP:no concern for electrolyte abnormality; no concern for kidney/liver damage Hcg: negative UA: not concerning for infection   Imaging Studies ordered:  I ordered imaging studies including  - [place] LE: to assess for complications contributing to patient's symptoms. I independently visualized and interpreted imaging I agree with the radiologist interpretation    Problem List / ED Course / Critical interventions / Medication management  Patient presented for sickle cell pain of right leg. Symptom has been progressing over the past 4 days. Sickle cell labs and imaging ordered along with pain management. CBC with leukocytosis at 12.9 - patient denying any infectious symptoms so I believe her leukocytosis is d/t her pain. Patient later stating that she has had some increased urinary frequency lately - UA without concern for infection. Patient with anemia at 9.7 which is improving from her last result of 8.7 2 days ago. CMP reassuring. Hcg negative. [place] without concern for DVT.  Consulted and shared lab results with FNP Hart Rochester from the sickle cell clinic who asked me to transfer patient over to the sickle cell clinic with IV still intact. FNP Hart Rochester does not believe that patient currently meets requirements for inpatient treatment. Patient will be transferred to the clinic by nursing staff given that she still has an IV intact. Shared this with patient who agrees to go over to the sickle cell clinic. I have reviewed the patients home medicines and have made adjustments as needed Patient was given return precautions. Patient stable for discharge at this time. Patient verbalized understanding of plan.    DDx: These are considered less likely due to history of present illness and  physical exam findings. Anemia/Aplastic crisis: HCT 9.7 which is stable for  patient's baseline levels Acute chest syndrome: lack of fever, respiratory symptoms Fat embolism: lack of respiratory symptoms and rashes on physical exam AKI: CMP reassuring VTE: no leg edema/swelling/tenderness   Social Determinants of Health:  none           Final Clinical Impression(s) / ED Diagnoses Final diagnoses:  Sickle cell pain crisis [facility])    Rx / DC Orders ED Discharge Orders     None         Dorthy Cooler, [place] 08/01/23 1102    Gwyneth Sprout, MD 08/06/23 [phone]

## 2023-08-02 DIAGNOSIS — D5700 Hb-SS disease with crisis, unspecified: Secondary | ICD-10-CM

## 2023-08-02 LAB — BASIC METABOLIC PANEL WITH GFR
Anion gap: 9 (ref 5–15)
BUN: 10 mg/dL (ref 6–20)
CO2: 25 mmol/L (ref 22–32)
Calcium: 9.3 mg/dL (ref 8.9–10.3)
Chloride: 103 mmol/L (ref 98–111)
Creatinine, Ser: 0.48 mg/dL (ref 0.44–1.00)
GFR, Estimated: >60 mL/min
Glucose, Bld: 109 mg/dL — ABNORMAL HIGH (ref 70–99)
Potassium: 3.6 mmol/L (ref 3.5–5.1)
Sodium: 137 mmol/L (ref 135–145)

## 2023-08-02 LAB — CBC
HCT: 25.8 % — ABNORMAL LOW (ref 36.0–46.0)
Hemoglobin: 8.9 g/dL — ABNORMAL LOW (ref 12.0–15.0)
MCH: 29.1 pg (ref 26.0–34.0)
MCHC: 34.5 g/dL (ref 30.0–36.0)
MCV: 84.3 fL (ref 80.0–100.0)
Platelets: 394 K/uL (ref 150–400)
RBC: 3.06 MIL/uL — ABNORMAL LOW (ref 3.87–5.11)
RDW: 16.1 % — ABNORMAL HIGH (ref 11.5–15.5)
WBC: 11.9 K/uL — ABNORMAL HIGH (ref 4.0–10.5)
nRBC: 2.7 % — ABNORMAL HIGH (ref 0.0–0.2)

## 2023-08-02 MED ORDER — OXYCODONE HCL 5 MG PO TABS
10.0000 mg | ORAL_TABLET | ORAL | Status: DC | PRN
  Administered 2023-08-02 (×3): 10 mg via ORAL
  Filled 2023-08-02 (×3): qty 2

## 2023-08-02 MED ORDER — INFLUENZA VIRUS VACC SPLIT PF (FLUZONE) 0.5 ML IM SUSY
0.5000 mL | PREFILLED_SYRINGE | INTRAMUSCULAR | Status: AC
  Administered 2023-08-03: 0.5 mL via INTRAMUSCULAR
  Filled 2023-08-02: qty 0.5

## 2023-08-02 MED ORDER — PNEUMOCOCCAL 20-VAL CONJ VACC 0.5 ML IM SUSY
0.5000 mL | PREFILLED_SYRINGE | INTRAMUSCULAR | Status: AC
  Administered 2023-08-03: 0.5 mL via INTRAMUSCULAR
  Filled 2023-08-02 (×2): qty 0.5

## 2023-08-02 NOTE — Progress Notes (Signed)
Patient refused am labs at 0530 and asked lab to come back later.

## 2023-08-02 NOTE — Plan of Care (Signed)

## 2023-08-02 NOTE — Progress Notes (Signed)
Subjective: Carmen [name] is a 32 year old female with a medical history significant for sickle cell disease, chronic pain syndrome, opiate dependence and tolerance, major depression, and anemia of chronic disease was admitted for sickle cell pain crisis. Patient continues to have worsening pain to right lower extremity.  Pain intensity is 9/10.  Patient states that pain intensity worsens with weightbearing. She denies any fever, chills, chest pain, shortness of breath, urinary symptoms, nausea, vomiting, or diarrhea.  Objective:  Vital signs in last 24 hours:  Vitals:   08/02/23 0106 08/02/23 0344 08/02/23 0543 08/02/23 0754  BP: 128/78  122/78   Pulse: 65  89   Resp: 18 17 18 15   Temp: 98.9 F (37.2 C)  98.4 F (36.9 C)   TempSrc: Oral  Oral   SpO2: 97% 95% 100% 94%    Intake/Output from previous day:   Intake/Output Summary (Last 24 hours) at 08/02/2023 1610 Last data filed at 08/02/2023 0600 Gross per 24 hour  Intake 2103.01 ml  Output --  Net 2103.01 ml    Physical Exam: General: Alert, awake, oriented x3, in no acute distress.  HEENT: [place]/AT PEERL, EOMI Neck: Trachea midline,  no masses, no thyromegal,y no JVD, no carotid bruit OROPHARYNX:  Moist, No exudate/ erythema/lesions.  Heart: Regular rate and rhythm, without murmurs, rubs, gallops, PMI non-displaced, no heaves or thrills on palpation.  Lungs: Clear to auscultation, no wheezing or rhonchi noted. No increased vocal fremitus resonant to percussion  Abdomen: Soft, nontender, nondistended, positive bowel sounds, no masses no hepatosplenomegaly noted..  Neuro: No focal neurological deficits noted cranial nerves II through XII grossly intact. DTRs 2+ bilaterally upper and lower extremities. Strength 5 out of 5 in bilateral upper and lower extremities. Musculoskeletal: No warm swelling or erythema around joints, no spinal tenderness noted. Psychiatric: Patient alert and oriented x3, good insight and cognition, good recent  to remote recall. Lymph node survey: No cervical axillary or inguinal lymphadenopathy noted.  Lab Results:  Basic Metabolic Panel:    Component Value Date/Time   NA 135 08/01/2023 0810   NA 145 (H) 06/14/2023 1220   K 3.7 08/01/2023 0810   CL 101 08/01/2023 0810   CO2 23 08/01/2023 0810   BUN 7 08/01/2023 0810   BUN 9 06/14/2023 1220   CREATININE 0.53 08/01/2023 0810   GLUCOSE 91 08/01/2023 0810   CALCIUM 9.8 08/01/2023 0810   CBC:    Component Value Date/Time   WBC 12.9 (H) 08/01/2023 0810   HGB 9.7 (L) 08/01/2023 0810   HGB 9.5 (L) 06/14/2023 1220   HCT 27.6 (L) 08/01/2023 0810   HCT 29.0 (L) 06/14/2023 1220   PLT 431 (H) 08/01/2023 0810   PLT 512 (H) 06/14/2023 1220   MCV 83.9 08/01/2023 0810   MCV 91 06/14/2023 1220   NEUTROABS 7.4 08/01/2023 0810   NEUTROABS 2.7 06/14/2023 1220   LYMPHSABS 3.9 08/01/2023 0810   LYMPHSABS 3.3 (H) 06/14/2023 1220   MONOABS 1.5 (H) 08/01/2023 0810   EOSABS 0.0 08/01/2023 0810   EOSABS 0.2 06/14/2023 1220   BASOSABS 0.1 08/01/2023 0810   BASOSABS 0.0 06/14/2023 1220    No results found for this or any previous visit (from the past 240 hour(s)).  Studies/Results: VAS [place] LOWER EXTREMITY VENOUS (DVT) (7a-7p)  Result Date: 08/01/2023  Lower Venous DVT Study Patient Name:  Carmen [name]  Date of Exam:   08/01/2023 Medical Rec #: [id number]       Accession #:    [id number] Date of  Birth: 10-18-1991       Patient Gender: F Patient Age:   66 years Exam Location:  [facility] Procedure:      VAS [place] LOWER EXTREMITY VENOUS (DVT) Referring Phys: [facility] MEREDITH --------------------------------------------------------------------------------  Indications: Pain. Other Indications: Sickle Cell Crisis. Comparison Study: Previous exam on 11/27/2012 was negative for DVT Performing Technologist: Ernestene Mention RVT, RDMS  Examination Guidelines: A complete evaluation includes B-mode imaging, spectral Doppler, color Doppler, and power Doppler as needed  of all accessible portions of each vessel. Bilateral testing is considered an integral part of a complete examination. Limited examinations for reoccurring indications may be performed as noted. The reflux portion of the exam is performed with the patient in reverse Trendelenburg.  +---------+---------------+---------+-----------+----------+--------------+ RIGHT    CompressibilityPhasicitySpontaneityPropertiesThrombus Aging +---------+---------------+---------+-----------+----------+--------------+ CFV      Full           Yes      Yes                                 +---------+---------------+---------+-----------+----------+--------------+ SFJ      Full                                                        +---------+---------------+---------+-----------+----------+--------------+ FV Prox  Full           Yes      Yes                                 +---------+---------------+---------+-----------+----------+--------------+ FV Mid   Full           Yes      Yes                                 +---------+---------------+---------+-----------+----------+--------------+ FV DistalFull           Yes      Yes                                 +---------+---------------+---------+-----------+----------+--------------+ PFV      Full                                                        +---------+---------------+---------+-----------+----------+--------------+ POP      Full           Yes      Yes                                 +---------+---------------+---------+-----------+----------+--------------+ PTV      Full                                                        +---------+---------------+---------+-----------+----------+--------------+ PERO  Full                                                        +---------+---------------+---------+-----------+----------+--------------+    +----+---------------+---------+-----------+----------+--------------+ LEFTCompressibilityPhasicitySpontaneityPropertiesThrombus Aging +----+---------------+---------+-----------+----------+--------------+ CFV Full           Yes      Yes                                 +----+---------------+---------+-----------+----------+--------------+     Summary: RIGHT: - There is no evidence of deep vein thrombosis in the lower extremity.  - No cystic structure found in the popliteal fossa.  LEFT: - No evidence of common femoral vein obstruction.   *See table(s) above for measurements and observations. Electronically signed by Sherald Hess MD on 08/01/2023 at 1:43:36 PM.    Final     Medications: Scheduled Meds:  DULoxetine  60 mg Oral Daily   enoxaparin (LOVENOX) injection  40 mg Subcutaneous QHS   folic acid  1 mg Oral Daily   HYDROmorphone   Intravenous Q4H   ketorolac  15 mg Intravenous Q6H   morphine  60 mg Oral Q12H   senna-docusate  1 tablet Oral BID   Continuous Infusions:  sodium chloride 100 mL/hr at 08/01/23 1159   PRN Meds:.cyclobenzaprine, diphenhydrAMINE, naloxone **AND** sodium chloride flush, ondansetron (ZOFRAN) IV, polyethylene glycol  Consultants: none  Procedures: none  Antibiotics: none  Assessment/Plan: Active Problems:   Sickle cell pain crisis (HCC) Sickle cell disease with pain crisis: Continue IV Dilaudid PCA without any changes Restart oxycodone 10 mg every 4 hours as needed for severe breakthrough pain Toradol 15 mg IV every 6 hours Decrease IV fluids to [facility] Monitor vital signs very closely, reevaluate pain scale regularly, and supplemental oxygen as needed.  Chronic pain syndrome: Continue home medications  Major depression: Stable.  Continue home medications  Anemia of chronic disease: Patient's hemoglobin is stable and consistent with her baseline.  There is no clinical indication for blood transfusion at this time. Code Status: Full  Code Family Communication: N/A Disposition Plan: Not yet ready for discharge  Rosaline Ezekiel Rennis Petty  APRN, MSN, FNP-C [facility] [address] [place], [place] [address] [phone]  If 7PM-7AM, please contact night-coverage.  08/02/2023, 9:04 AM  LOS: 1 day

## 2023-08-03 ENCOUNTER — Other Ambulatory Visit: Payer: Self-pay

## 2023-08-03 DIAGNOSIS — D5700 Hb-SS disease with crisis, unspecified: Secondary | ICD-10-CM | POA: Diagnosis not present

## 2023-08-03 MED ORDER — HEPARIN SOD (PORK) LOCK FLUSH 100 UNIT/ML IV SOLN
INTRAVENOUS | Status: AC
  Administered 2023-08-03: 500 [IU]
  Filled 2023-08-03: qty 5

## 2023-08-03 NOTE — Discharge Summary (Signed)
Physician Discharge Summary  Carmen [name] AOZ:[id number] DOB: [birth year] DOA: 08/01/2023  PCP: Massie Maroon, FNP  Admit date: 08/01/2023  Discharge date: 08/03/2023  Discharge Diagnoses:  Principal Problem:   Sickle cell pain crisis (HCC) Active Problems:   Anemia of chronic disease   Major depressive disorder   Chronic pain syndrome   Discharge Condition: Stable  Disposition:   Follow-up Information     Massie Maroon, FNP Follow up.   Specialty: Family Medicine Contact information: [address] [place] [address] [phone]                Pt is discharged home in good condition and is to follow up with Massie Maroon, FNP this week to have labs evaluated. Carmen [name] is instructed to increase activity slowly and balance with rest for the next few days, and use prescribed medication to complete treatment of pain  Diet: Regular Wt Readings from Last 3 Encounters:  08/01/23 86.2 kg  07/28/23 88.5 kg  06/23/23 88.5 kg     History of Present Illness: Carmen [name] is a 32 year old female with a medical history significant for sickle cell disease, chronic pain syndrome, opiate dependence and tolerance, anemia of chronic disease, and major depression presents with complaints of bilateral lower extremity pain that is consistent with previous sickle cell pain crisis.  Patient was evaluated in the emergency department for this problem.  Agreed with EDP that patient was appropriate to transition to sickle cell day infusion clinic for further management of her sickle cell crisis.  Patient was discharged from inpatient admission on yesterday.  She states that overnight pain intensity increased especially in her right lower extremity.  She states that pain was constant and sharp and uncontrolled by her home medications, therefore she returned to the emergency department for evaluation.  Patient states that pain intensity has not improved despite IV  Dilaudid, IV Toradol, and IV fluids.  Patient rates her pain as 10/10.  She denies headache, chest pain, urinary symptoms, nausea, vomiting, or diarrhea. Reviewed all laboratory values, consistent with patient's baseline without any changes from previous.   Sickle cell day infusion center course: Vital signs remained stable during day hospital admission.  IV Dilaudid PCA, IV Toradol, Tylenol, and IV fluids were initiated, patient's pain did not improve throughout admission.  She will be readmitted to [facility] for further management of her sickle cell crisis.    Hospital Course:  Sickle cell disease with pain crisis:  Patient was admitted for sickle cell pain crisis and managed appropriately with IVF, IV Dilaudid via PCA and IV Toradol, as well as other adjunct therapies per sickle cell pain management protocols.  Patient was transition to her home medications of oxycodone 10 mg every 6 hours as needed for severe breakthrough pain.  MS Contin 60 mg every 12 hours was continued throughout admission. Today, patient states that she feels well and pain intensity has decreased to 3/10.  She states that she is ready for discharge and feels that she can manage at home on current medication regimen. Patient's vital signs remained within normal limits and consistent with her baseline throughout this admission. Patient is alert, oriented, and ambulating without assistance.  Patient was therefore discharged home today in a hemodynamically stable condition.   Carmen [name] will follow-up with PCP within 1 week of this discharge. Carmen [name] was counseled extensively about nonpharmacologic means of pain management, patient verbalized understanding and was appreciative of  the care received during this admission.   We discussed the need for good hydration, monitoring of hydration status, avoidance of heat, cold, stress, and infection triggers.Patient was reminded of the need to seek medical attention immediately if  any symptom of bleeding, anemia, or infection occurs.  Discharge Exam: Vitals:   08/03/23 0756 08/03/23 1002  BP:  (!) 148/98  Pulse:  (!) 110  Resp: 16 14  Temp:  98.4 F (36.9 C)  SpO2: 100% 98%   Vitals:   08/03/23 0417 08/03/23 0623 08/03/23 0756 08/03/23 1002  BP:  128/85  (!) 148/98  Pulse:  (!) 102  (!) 110  Resp: 16 18 16 14   Temp:  98.7 F (37.1 C)  98.4 F (36.9 C)  TempSrc:  Oral  Oral  SpO2: 94% 98% 100% 98%    General appearance : Awake, alert, not in any distress. Speech Clear. Not toxic looking HEENT: Atraumatic and Normocephalic, pupils equally reactive to light and accomodation Neck: Supple, no JVD. No cervical lymphadenopathy.  Chest: Good air entry bilaterally, no added sounds  CVS: S1 S2 regular, no murmurs.  Abdomen: Bowel sounds present, Non tender and not distended with no gaurding, rigidity or rebound. Extremities: B/L Lower Ext shows no edema, both legs are warm to touch Neurology: Awake alert, and oriented X 3, CN II-XII intact, Non focal Skin: No Rash  Discharge Instructions  Discharge Instructions     Discharge patient   Complete by: As directed    Discharge disposition: 01-Home or Self Care   Discharge patient date: 08/03/2023      Allergies as of 08/03/2023       Reactions   Ketamine Hives, Nausea And Vomiting, Other (See Comments)   Makes the patient "feel funny" also        Medication List     TAKE these medications    Benadryl Allergy 25 MG capsule Generic drug: diphenhydrAMINE Take 25 mg by mouth every 6 (six) hours as needed for allergies.   cyclobenzaprine 10 MG tablet Commonly known as: FLEXERIL Take 1 tablet (10 mg total) by mouth at bedtime. TAKE 1 TABLET BY MOUTH DAILY AS NEEDED FOR MUSCLE SPASMS. What changed:  when to take this reasons to take this additional instructions   DULoxetine 30 MG capsule Commonly known as: CYMBALTA Take 2 capsules (60 mg total) by mouth daily.   folic acid 1 MG  tablet Commonly known as: FOLVITE Take 1 tablet (1 mg total) by mouth daily.   morphine 60 MG 12 hr tablet Commonly known as: MS CONTIN Take 1 tablet (60 mg total) by mouth every 12 (twelve) hours.   Oxycodone HCl 10 MG Tabs Take 1 tablet (10 mg total) by mouth every 6 (six) hours as needed (pain). What changed: when to take this   Vitamin D (Ergocalciferol) 1.25 MG (50000 UNIT) Caps capsule Commonly known as: DRISDOL TAKE ONE CAPSULE BY MOUTH ONE TIME PER WEEK What changed:  how much to take how to take this when to take this additional instructions        The results of significant diagnostics from this hospitalization (including imaging, microbiology, ancillary and laboratory) are listed below for reference.    Significant Diagnostic Studies: VAS [place] LOWER EXTREMITY VENOUS (DVT) (7a-7p)  Result Date: 08/01/2023  Lower Venous DVT Study Patient Name:  Carmen [name]  Date of Exam:   08/01/2023 Medical Rec #: [id number]       Accession #:    [id number] Date of Birth:  April 13, 1991       Patient Gender: F Patient Age:   74 years Exam Location:  [facility] Procedure:      VAS [place] LOWER EXTREMITY VENOUS (DVT) Referring Phys: [facility] --------------------------------------------------------------------------------  Indications: Pain. Other Indications: Sickle Cell Crisis. Comparison Study: Previous exam on 11/27/2012 was negative for DVT Performing Technologist: Ernestene Mention RVT, RDMS  Examination Guidelines: A complete evaluation includes B-mode imaging, spectral Doppler, color Doppler, and power Doppler as needed of all accessible portions of each vessel. Bilateral testing is considered an integral part of a complete examination. Limited examinations for reoccurring indications may be performed as noted. The reflux portion of the exam is performed with the patient in reverse Trendelenburg.  +---------+---------------+---------+-----------+----------+--------------+ RIGHT     CompressibilityPhasicitySpontaneityPropertiesThrombus Aging +---------+---------------+---------+-----------+----------+--------------+ CFV      Full           Yes      Yes                                 +---------+---------------+---------+-----------+----------+--------------+ SFJ      Full                                                        +---------+---------------+---------+-----------+----------+--------------+ FV Prox  Full           Yes      Yes                                 +---------+---------------+---------+-----------+----------+--------------+ FV Mid   Full           Yes      Yes                                 +---------+---------------+---------+-----------+----------+--------------+ FV DistalFull           Yes      Yes                                 +---------+---------------+---------+-----------+----------+--------------+ PFV      Full                                                        +---------+---------------+---------+-----------+----------+--------------+ POP      Full           Yes      Yes                                 +---------+---------------+---------+-----------+----------+--------------+ PTV      Full                                                        +---------+---------------+---------+-----------+----------+--------------+ PERO  Full                                                        +---------+---------------+---------+-----------+----------+--------------+   +----+---------------+---------+-----------+----------+--------------+ LEFTCompressibilityPhasicitySpontaneityPropertiesThrombus Aging +----+---------------+---------+-----------+----------+--------------+ CFV Full           Yes      Yes                                 +----+---------------+---------+-----------+----------+--------------+     Summary: RIGHT: - There is no evidence of deep vein thrombosis in the lower  extremity.  - No cystic structure found in the popliteal fossa.  LEFT: - No evidence of common femoral vein obstruction.   *See table(s) above for measurements and observations. Electronically signed by Sherald Hess MD on 08/01/2023 at 1:43:36 PM.    Final     Microbiology: No results found for this or any previous visit (from the past 240 hour(s)).   Labs: Basic Metabolic Panel: Recent Labs  Lab 07/28/23 1015 07/29/23 0849 07/30/23 1138 08/01/23 0810 08/02/23 1055  NA 139 135 137 135 137  K 3.1* 2.9* 4.1 3.7 3.6  CL 108 105 106 101 103  CO2 22 24 24 23 25   GLUCOSE 88 110* 97 91 109*  BUN 9 8 7 7 10   CREATININE 0.62 0.48 0.34* 0.53 0.48  CALCIUM 9.1 8.7* 9.0 9.8 9.3  MG  --   --  1.9  --   --    Liver Function Tests: Recent Labs  Lab 07/28/23 1015 08/01/23 0810  AST 22 22  ALT 17 20  ALKPHOS 62 71  BILITOT 1.2 2.0*  PROT 8.0 8.2*  ALBUMIN 4.1 4.3   No results for input(s): "LIPASE", "AMYLASE" in the last 168 hours. No results for input(s): "AMMONIA" in the last 168 hours. CBC: Recent Labs  Lab 07/28/23 1015 07/29/23 0849 07/30/23 1138 08/01/23 0810 08/02/23 1055  WBC 6.4 8.5 10.8* 12.9* 11.9*  NEUTROABS 2.9  --   --  7.4  --   HGB 9.4* 8.2* 8.7* 9.7* 8.9*  HCT 26.9* 22.7* 24.1* 27.6* 25.8*  MCV 83.8 81.9 82.8 83.9 84.3  PLT 536* 443* 406* 431* 394   Cardiac Enzymes: No results for input(s): "CKTOTAL", "CKMB", "CKMBINDEX", "TROPONINI" in the last 168 hours. BNP: Invalid input(s): "POCBNP" CBG: No results for input(s): "GLUCAP" in the last 168 hours.  Time coordinating discharge: 30 minutes  Signed: Nolon Nations  APRN, MSN, FNP-C Patient Care [facility] [address] [place], [place] [address] [phone]  [facility] 08/03/2023, 12:38 PM

## 2023-08-03 NOTE — Plan of Care (Signed)
  Problem: Education: Goal: Knowledge of vaso-occlusive preventative measures will improve Outcome: Completed/Met Goal: Awareness of infection prevention will improve Outcome: Completed/Met Goal: Awareness of signs and symptoms of anemia will improve Outcome: Completed/Met Goal: Long-term complications will improve Outcome: Completed/Met   Problem: Self-Care: Goal: Ability to incorporate actions that prevent/reduce pain crisis will improve Outcome: Completed/Met   Problem: Bowel/Gastric: Goal: Gut motility will be maintained Outcome: Completed/Met   Problem: Tissue Perfusion: Goal: Complications related to inadequate tissue perfusion will be avoided or minimized Outcome: Completed/Met   Problem: Respiratory: Goal: Pulmonary complications will be avoided or minimized Outcome: Completed/Met Goal: Acute Chest Syndrome will be identified early to prevent complications Outcome: Completed/Met   Problem: Fluid Volume: Goal: Ability to maintain a balanced intake and output will improve Outcome: Completed/Met   Problem: Sensory: Goal: Pain level will decrease with appropriate interventions Outcome: Completed/Met   Problem: Health Behavior: Goal: Postive changes in compliance with treatment and prescription regimens will improve Outcome: Completed/Met   Problem: Education: Goal: Knowledge of General Education information will improve Description: Including pain rating scale, medication(s)/side effects and non-pharmacologic comfort measures Outcome: Completed/Met   Problem: Health Behavior/Discharge Planning: Goal: Ability to manage health-related needs will improve Outcome: Completed/Met   Problem: Clinical Measurements: Goal: Ability to maintain clinical measurements within normal limits will improve Outcome: Completed/Met Goal: Will remain free from infection Outcome: Completed/Met Goal: Diagnostic test results will improve Outcome: Completed/Met Goal: Respiratory  complications will improve Outcome: Completed/Met Goal: Cardiovascular complication will be avoided Outcome: Completed/Met   Problem: Activity: Goal: Risk for activity intolerance will decrease Outcome: Completed/Met   Problem: Nutrition: Goal: Adequate nutrition will be maintained Outcome: Completed/Met   Problem: Coping: Goal: Level of anxiety will decrease Outcome: Completed/Met   Problem: Elimination: Goal: Will not experience complications related to bowel motility Outcome: Completed/Met Goal: Will not experience complications related to urinary retention Outcome: Completed/Met   Problem: Pain Managment: Goal: General experience of comfort will improve Outcome: Completed/Met   Problem: Safety: Goal: Ability to remain free from injury will improve Outcome: Completed/Met   Problem: Skin Integrity: Goal: Risk for impaired skin integrity will decrease Outcome: Completed/Met

## 2023-08-03 NOTE — TOC Initial Note (Signed)
Transition of Care [facility]) - Initial/Assessment Note    Patient Details  Name: Carmen [name] MRN: [id number] Date of Birth: [birth year]  Transition [facility] CM/SW Contact:    Beckie Busing, RN Phone [phone]  08/03/2023, 10:06 AM  Clinical Narrative:                 TOC following patient with high risk for readmission. Patient is from home where she functions independently. Patient states that she has no DME [facility] needs. Patient does follow up with the [facility] on a regular basis for PCP. Patient has access to medications and they are affordable. Currently there are no TOC needs.    Barriers to Discharge: No Barriers Identified   Patient Goals and CMS Choice Patient states their goals for this hospitalization and ongoing recovery are:: Wants to go home CMS Medicare.gov Compare Post Acute Care list provided to::  (n/a) Choice offered to / list presented to : NA [place] ownership interest in [facility].provided to::  (n/a)    Expected Discharge Plan and Services In-house Referral: NA Discharge Planning Services: CM Consult Post Acute Care Choice: NA Living arrangements for the past 2 months: Apartment Expected Discharge Date: 08/03/23               DME Arranged: N/A DME Agency: NA       HH Arranged: NA HH Agency: NA        Prior Living Arrangements/Services Living arrangements for the past 2 months: Apartment Lives with:: Parents Patient language and need for interpreter reviewed:: Yes Do you feel safe going back to the place where you live?: Yes      Need for Family Participation in Patient Care: No (Comment) Care giver support system in place?: Yes (comment) Current home services: Other (comment) (none) Criminal Activity/Legal Involvement Pertinent to Current Situation/Hospitalization: Yes - Comment as needed  Activities of Daily Living   ADL Screening (condition at time of admission) Independently performs ADLs?: Yes  (appropriate for developmental age) Does the patient have a NEW difficulty with bathing/dressing/toileting/self-feeding that is expected to last >3 days?: No Does the patient have a NEW difficulty with getting in/out of bed, walking, or climbing stairs that is expected to last >3 days?: No Does the patient have a NEW difficulty with communication that is expected to last >3 days?: No Is the patient deaf or have difficulty hearing?: No Does the patient have difficulty seeing, even when wearing glasses/contacts?: No Does the patient have difficulty concentrating, remembering, or making decisions?: No  Permission Sought/Granted Permission sought to share information with : Family Supports Permission granted to share information with : No              Emotional Assessment Appearance:: Appears stated age Attitude/Demeanor/Rapport: Gracious Affect (typically observed): Pleasant Orientation: : Oriented to Self, Oriented to Place, Oriented to  Time, Oriented to Situation Alcohol / Substance Use: Not Applicable Psych Involvement: No (comment)  Admission diagnosis:  Sickle cell pain crisis (HCC) [D57.00] Patient Active Problem List   Diagnosis Date Noted   Hospital discharge follow-up 03/09/2023   Nausea & vomiting 03/03/2022   Acute chest syndrome due to sickle cell crisis (HCC) 09/05/2021   Chronic pain syndrome 08/21/2021   Moderate episode of recurrent major depressive disorder (HCC) 05/28/2021   Major depressive disorder 05/22/2021   Tobacco abuse 05/21/2021   Sickle cell disease with crisis (HCC) 03/23/2021   Acute pain of right shoulder    Sickle cell  anemia with crisis (HCC) 02/16/2021   Community acquired pneumonia of right lower lobe of lung    Fever of unknown origin 10/14/2020   Sickle cell crisis (HCC) 03/31/2020   Bacterial vaginitis 01/04/2020   Acute cystitis without hematuria    Dysuria 12/31/2019   Urine leukocytes    Sickle cell pain crisis (HCC) 09/10/2019    Sickle cell disease (HCC) 12/04/2018   Alpha thalassemia silent carrier 12/04/2018   Chronic prescription opiate use 03/13/2018   Chronic musculoskeletal pain 03/13/2018   Vitamin D deficiency 01/13/2018   Leukocytosis 08/21/2017   Hypokalemia 06/27/2017   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[facility]) 04/05/2017   Constipation due to pain medication    Hb-SS disease without crisis (HCC) 08/15/2016   Anemia of chronic disease    Chest pain 11/10/2015   Sickle cell anemia with pain (HCC) 02/16/2015   Thrombocytosis 11/22/2014   Systolic ejection murmur 09/11/2014   Sickle cell anemia of mother during pregnancy (HCC) 05/16/2014   PCP:  Massie Maroon, FNP Pharmacy:   [phone] [place], [place] - [address] AT [facility] [address] [place] [address] Phone: [phone] Fax: [phone]     Social Determinants of Health (SDOH) Social History: SDOH Screenings   Food Insecurity: No Food Insecurity (08/01/2023)  Housing: Patient Declined (08/01/2023)  Transportation Needs: No Transportation Needs (08/01/2023)  Utilities: Not At Risk (08/01/2023)  Alcohol Screen: Low Risk  (03/09/2023)  Depression (PHQ2-9): Low Risk  (06/14/2023)  Financial Resource Strain: Low Risk  (03/09/2023)  Physical Activity: Insufficiently Active (03/09/2023)  Social Connections: Moderately Isolated (03/09/2023)  Stress: No Stress Concern Present (03/09/2023)  Tobacco Use: High Risk (08/01/2023)   SDOH Interventions:     Readmission Risk Interventions    08/03/2023    9:59 AM 05/23/2023    4:47 PM 03/24/2023    8:39 AM  Readmission Risk Prevention Plan  Post Dischage Appt   Complete  Medication Screening   Complete  Transportation Screening Complete Complete Complete  PCP or Specialist Appt within 5-7 Days  Complete   PCP or Specialist Appt within 3-5 Days Not Complete    Not Complete comments Patient to follow up with PCP at discharge    Home Care  Screening  Complete   Medication Review (RN CM)  Complete   HRI or Home Care Consult Not Complete    HRI or Home Care Consult comments not needed    Social Work Consult for Recovery Care Planning/Counseling Complete    Palliative Care Screening Not Applicable    Medication Review Oceanographer) Complete

## 2023-08-03 NOTE — Progress Notes (Signed)
Patient discharged home in stable condition. Discharge instructions given. Pt verbalized understanding with no immediate questions or concerns. Pt declined wheelchair for discharge.

## 2023-08-03 NOTE — Progress Notes (Signed)
Chaplain engaged in an initial visit with Carmen [name]. Chaplain and Carmen [name] engaged in narrative life review. She shared about her recent pain crises and how it was different than previous crises. Chaplain and Carmen [name] talked about current stressors in her life. Carmen [name] was able to pinpoint something that has weighed heavily on her. Chaplain affirmed and normalized her emotions while offering encouragement and support.   Carmen [name] also shared about her family dynamics/structures, her hopes for the future, and her coping skills. Carmen [name] journals and utilizes art therapy. She finds a lot of joy and purpose in parenting her 30 year-old son.   Chaplain assesses that Carmen [name] has a number of healthy practices in place and that she has a supportive community around her. Chaplain did encourage Carmen [name] to continue to find things that allow her to pour into herself. Chaplain offered a blessing over her.   Chaplain Carmen [name], M.Div, [facility]   08/03/23 1000  Spiritual Encounters  Type of Visit Initial  Care provided to: Patient  Reason for visit Routine spiritual support  Spiritual Framework  Presenting Themes Meaning/purpose/sources of inspiration;Impactful experiences and emotions;Coping tools;Community and relationships;Significant life change;Values and beliefs  Community/Connection Family  Strengths Family support, meaning/purpose in life  Interventions  Spiritual Care Interventions Made Compassionate presence;Established relationship of care and support;Reflective listening;Normalization of emotions;Explored values/beliefs/practices/strengths;Encouragement;Supported grief process;Narrative/life review;Reconciliation with self/others  Intervention Outcomes  Outcomes Awareness of support;Connection to spiritual care

## 2023-08-04 ENCOUNTER — Telehealth: Payer: Self-pay

## 2023-08-04 ENCOUNTER — Other Ambulatory Visit: Payer: Self-pay

## 2023-08-04 NOTE — Telephone Encounter (Signed)
CLINICAL QUESTIONS ANSWERED AND REQUEST SUBMITTED, STATUS PENDING

## 2023-08-04 NOTE — Telephone Encounter (Signed)
Pharmacy Patient Advocate Encounter   Received notification from Patient Advice Request messages that prior authorization for MORPHINE SULFATE ER 60MG  is required/requested.   Insurance verification completed.   The patient is insured through [facility] .   Per test claim: PA required; PA started via CoverMyMeds. KEY [id number] . Waiting for clinical questions to populate.

## 2023-08-04 NOTE — Telephone Encounter (Signed)
Pharmacy Patient Advocate Encounter  Received notification from [facility] that Prior Authorization for MORPHINE SULFATE ER has been APPROVED from 07/30/2023 to 08/03/2024   PA #/Case ID/Reference #: [id number]

## 2023-08-05 ENCOUNTER — Telehealth: Payer: Self-pay | Admitting: *Deleted

## 2023-08-05 NOTE — Transitions of Care (Post Inpatient/ED Visit) (Signed)
   08/05/2023  Name: Carmen [name] MRN: [id number] DOB: [birth year]  Today's TOC FU Call Status: Today's TOC FU Call Status:: Unsuccessful Call (1st Attempt) Unsuccessful Call (1st Attempt) Date: 08/05/23  Attempted to reach the patient regarding the most recent Inpatient visit;  Received automated outgoing voice message stating that "the person you are trying to call has a voice mail box that has not been set up; please hang up and try your call again later;" unable to leave voice message requesting call back   Follow Up Plan: Additional outreach attempts will be made to reach the patient to complete the Transitions of Care (Post Inpatient visit) call.   Caryl Pina, RN, BSN, Media planner  Transitions of Care  [facility] - [facility] [phone]: direct office

## 2023-08-08 ENCOUNTER — Telehealth: Payer: Self-pay | Admitting: *Deleted

## 2023-08-08 NOTE — Transitions of Care (Post Inpatient/ED Visit) (Signed)
   08/08/2023  Name: Carmen [name] MRN: [id number] DOB: 1991/04/12  Today's TOC FU Call Status: Today's TOC FU Call Status:: Unsuccessful Call (2nd Attempt) Unsuccessful Call (2nd Attempt) Date: 08/08/23  Attempted to reach the patient regarding the most recent Inpatient visit;  Received automated outgoing voice message stating that "the person you are trying to call has a voice mail box that has not been set up; please hang up and try your call again later;" unable to leave voice message requesting call back   Follow Up Plan: Additional outreach attempts will be made to reach the patient to complete the Transitions of Care (Post Inpatient visit) call.   Caryl Pina, RN, BSN, Media planner  Transitions of Care  [facility] - [facility] [phone]: direct office

## 2023-08-09 ENCOUNTER — Telehealth: Payer: Self-pay | Admitting: *Deleted

## 2023-08-09 NOTE — Transitions of Care (Post Inpatient/ED Visit) (Signed)
   08/09/2023  Name: Carmen [name] MRN: [id number] DOB: 1991-06-30  Today's TOC FU Call Status: Today's TOC FU Call Status:: Unsuccessful Call (3rd Attempt) Unsuccessful Call (3rd Attempt) Date: 08/09/23  Attempted to reach the patient regarding the most recent Inpatient visit;  Received automated outgoing voice message stating that "the person you are trying to call has a voice mail box that has not been set up; please hang up and try your call again later;" unable to leave voice message requesting call back   Follow Up Plan: No further outreach attempts will be made at this time. We have been unable to contact the patient.  Caryl Pina, RN, BSN, Media planner  Transitions of Care  [facility] - [facility] [phone]: direct office

## 2023-08-16 ENCOUNTER — Other Ambulatory Visit: Payer: Self-pay

## 2023-08-16 ENCOUNTER — Other Ambulatory Visit: Payer: Self-pay | Admitting: Family Medicine

## 2023-08-16 DIAGNOSIS — Z79891 Long term (current) use of opiate analgesic: Secondary | ICD-10-CM

## 2023-08-16 DIAGNOSIS — D571 Sickle-cell disease without crisis: Secondary | ICD-10-CM

## 2023-08-16 MED ORDER — OXYCODONE HCL 10 MG PO TABS: 10.0000 mg | ORAL_TABLET | Freq: Four times a day (QID) | ORAL | 0 refills | Status: DC | PRN

## 2023-08-16 NOTE — Telephone Encounter (Signed)
Please advise Kh 

## 2023-08-16 NOTE — Progress Notes (Signed)
Reviewed PDMP substance reporting system prior to prescribing opiate medications. No inconsistencies noted.   Meds ordered this encounter  Medications  . Oxycodone HCl 10 MG TABS    Sig: Take 1 tablet (10 mg total) by mouth every 6 (six) hours as needed (pain).    Dispense:  60 tablet    Refill:  0    Order Specific Question:   Supervising Provider    Answer:   Quentin Angst [[id number]]     Nolon Nations  APRN, MSN, FNP-C Patient Care [facility] [address] [place], [place] [address] [phone]

## 2023-08-18 ENCOUNTER — Telehealth: Payer: Self-pay

## 2023-08-18 ENCOUNTER — Other Ambulatory Visit: Payer: Self-pay

## 2023-08-18 NOTE — Telephone Encounter (Signed)
Pt was advised Kh 

## 2023-08-18 NOTE — Telephone Encounter (Signed)
Pt need pa for oxycodone HCL 10 mg. Please advise Kh

## 2023-08-31 ENCOUNTER — Other Ambulatory Visit: Payer: Self-pay | Admitting: Family Medicine

## 2023-08-31 ENCOUNTER — Other Ambulatory Visit: Payer: Self-pay

## 2023-08-31 DIAGNOSIS — Z79891 Long term (current) use of opiate analgesic: Secondary | ICD-10-CM

## 2023-08-31 DIAGNOSIS — D571 Sickle-cell disease without crisis: Secondary | ICD-10-CM

## 2023-08-31 MED ORDER — MORPHINE SULFATE ER 60 MG PO TBCR: 60.0000 mg | EXTENDED_RELEASE_TABLET | Freq: Two times a day (BID) | ORAL | 0 refills | Status: DC

## 2023-08-31 NOTE — Progress Notes (Signed)
Reviewed PDMP substance reporting system prior to prescribing opiate medications. No inconsistencies noted.  Meds ordered this encounter  Medications   morphine (MS CONTIN) 60 MG 12 hr tablet    Sig: Take 1 tablet (60 mg total) by mouth every 12 (twelve) hours.    Dispense:  60 tablet    Refill:  0    Order Specific Question:   Supervising Provider    Answer:   [name], [name] [[id number]]     Latania Bascomb Moore Shahzain Kiester  APRN, MSN, FNP-C Patient Care Center [facility] [address]  [place], [place] [address] [phone]  

## 2023-08-31 NOTE — Telephone Encounter (Signed)
Please advise Kh

## 2023-09-08 ENCOUNTER — Other Ambulatory Visit: Payer: Self-pay | Admitting: Family Medicine

## 2023-09-08 DIAGNOSIS — Z79891 Long term (current) use of opiate analgesic: Secondary | ICD-10-CM

## 2023-09-08 DIAGNOSIS — D571 Sickle-cell disease without crisis: Secondary | ICD-10-CM

## 2023-09-08 MED ORDER — OXYCODONE HCL 10 MG PO TABS: 10.0000 mg | ORAL_TABLET | Freq: Four times a day (QID) | ORAL | 0 refills | Status: DC | PRN

## 2023-09-08 NOTE — Telephone Encounter (Signed)
Reviewed PDMP substance reporting system prior to prescribing opiate medications. No inconsistencies noted.  Meds ordered this encounter  Medications   Oxycodone HCl 10 MG TABS    Sig: Take 1 tablet (10 mg total) by mouth every 6 (six) hours as needed (pain).    Dispense:  60 tablet    Refill:  0    Order Specific Question:   Supervising Provider    Answer:   [name], [name] [[id number]]   Xaria Judon Moore Orie Cuttino  APRN, MSN, FNP-C Patient Care Center [facility] [address]  [place], [place] [address] [phone]  

## 2023-09-20 ENCOUNTER — Ambulatory Visit: Payer: MEDICAID | Admitting: Family Medicine

## 2023-09-20 ENCOUNTER — Encounter: Payer: Self-pay | Admitting: Family Medicine

## 2023-09-20 VITALS — BP 112/64 | HR 74 | Temp 97.4°F | Wt 180.0 lb

## 2023-09-20 DIAGNOSIS — G894 Chronic pain syndrome: Secondary | ICD-10-CM

## 2023-09-20 DIAGNOSIS — D571 Sickle-cell disease without crisis: Secondary | ICD-10-CM | POA: Diagnosis not present

## 2023-09-20 NOTE — Progress Notes (Signed)
Established Patient Office Visit  Subjective   Patient ID: Carmen [name], female    DOB: [birth year]  Age: 32 y.o. MRN: [id number]  Chief Complaint  Patient presents with   Sickle Cell Anemia    Carmen [name] is a very pleasant 32 year old female with a medical history significant for sickle cell disease, chronic pain syndrome, opiate dependence and tolerance, bipolar depression, and history of anemia of chronic disease that presents for follow-up of her chronic conditions.  Carmen [name] has been doing very well and is without complaint on today.  Patient has a history of chronic pain secondary to her sickle cell disease.  Her chronic pain has been managed by MS Contin and oxycodone.  Patient last had these medications this a.m. with moderate relief.  She rates her pain as 5/10.  Her chronic pain is primarily to her low back and lower extremities.  Patient says that her triggers for increase in pain intensity are typically cold weather and stress. Today, she denies dizziness, headache, chest pain, shortness of breath, urinary symptoms, nausea, vomiting, or diarrhea.       Review of Systems  Constitutional: Negative.   HENT: Negative.    Eyes: Negative.   Respiratory: Negative.    Cardiovascular: Negative.   Gastrointestinal: Negative.   Genitourinary: Negative.   Musculoskeletal:  Positive for back pain and joint pain.  Skin: Negative.   Neurological: Negative.   Psychiatric/Behavioral: Negative.        Objective:     BP 112/64   Pulse 74   Temp (!) 97.4 F (36.3 C)   Wt 180 lb (81.6 kg)   SpO2 100%   BMI 31.89 kg/m  BP Readings from Last 3 Encounters:  10/03/23 137/85  09/20/23 112/64  08/03/23 (!) 148/98   Wt Readings from Last 3 Encounters:  10/01/23 190 lb (86.2 kg)  09/20/23 180 lb (81.6 kg)  08/01/23 190 lb (86.2 kg)      Physical Exam Constitutional:      Appearance: Normal appearance. She is obese.  Eyes:     Pupils: Pupils are equal, round, and reactive  to light.  Cardiovascular:     Rate and Rhythm: Normal rate and regular rhythm.     Pulses: Normal pulses.  Pulmonary:     Effort: Pulmonary effort is normal.  Abdominal:     General: Abdomen is flat. Bowel sounds are normal.  Musculoskeletal:        General: Normal range of motion.  Skin:    General: Skin is warm.  Psychiatric:        Mood and Affect: Mood normal.        Behavior: Behavior normal.        Thought Content: Thought content normal.        Judgment: Judgment normal.     Results for orders placed or performed in visit on 09/20/23  [id number] 11+Oxyco+Alc+Crt-Bund  Result Value Ref Range   Ethanol Negative Cutoff=0.020 %   Amphetamines, Urine Negative Cutoff=1000 ng/mL   Barbiturate Negative Cutoff=200 ng/mL   BENZODIAZ UR QL Negative Cutoff=200 ng/mL   Cannabinoid Quant, Ur See Final Results Cutoff=50 ng/mL   Cocaine (Metabolite) Negative Cutoff=300 ng/mL   OPIATE SCREEN URINE See Final Results Cutoff=300 ng/mL   Oxycodone/Oxymorphone, Urine See Final Results Cutoff=300 ng/mL   Phencyclidine Negative Cutoff=25 ng/mL   Methadone Screen, Urine Negative Cutoff=300 ng/mL   Propoxyphene Negative Cutoff=300 ng/mL   Meperidine Negative Cutoff=200 ng/mL   Tramadol Negative Cutoff=200 ng/mL   Creatinine  101.7 20.0 - 300.0 mg/dL   pH, Urine 5.4 4.5 - 8.9  Sickle Cell Panel  Result Value Ref Range   Glucose 82 70 - 99 mg/dL   BUN 11 6 - 20 mg/dL   Creatinine, Ser 1.61 0.57 - 1.00 mg/dL   eGFR 096 >04 VW/UJW/1.19   BUN/Creatinine Ratio 18 9 - 23   Sodium 140 134 - 144 mmol/L   Potassium 3.8 3.5 - 5.2 mmol/L   Chloride 104 96 - 106 mmol/L   CO2 21 20 - 29 mmol/L   Calcium 9.9 8.7 - 10.2 mg/dL   Total Protein 7.9 6.0 - 8.5 g/dL   Albumin 4.7 4.0 - 5.0 g/dL   Globulin, Total 3.2 1.5 - 4.5 g/dL   Bilirubin Total 1.5 (H) 0.0 - 1.2 mg/dL   Alkaline Phosphatase 82 44 - 121 IU/L   AST 34 0 - 40 IU/L   ALT 27 0 - 32 IU/L   Ferritin 451 (H) 15 - 150 ng/mL   Vit D,  25-Hydroxy 29.0 (L) 30.0 - 100.0 ng/mL   WBC 9.5 3.4 - 10.8 x10E3/uL   RBC 3.29 (L) 3.77 - 5.28 x10E6/uL   Hemoglobin 9.7 (L) 11.1 - 15.9 g/dL   Hematocrit 14.7 (L) 82.9 - 46.6 %   MCV 89 79 - 97 fL   MCH 29.5 26.6 - 33.0 pg   MCHC 33.1 31.5 - 35.7 g/dL   RDW 56.2 13.0 - 86.5 %   Platelets 490 (H) 150 - 450 x10E3/uL   Neutrophils 53 Not Estab. %   Lymphs 36 Not Estab. %   Monocytes 10 Not Estab. %   Eos 1 Not Estab. %   Basos 0 Not Estab. %   Neutrophils Absolute 5.0 1.4 - 7.0 x10E3/uL   Lymphocytes Absolute 3.5 (H) 0.7 - 3.1 x10E3/uL   Monocytes Absolute 1.0 (H) 0.1 - 0.9 x10E3/uL   EOS (ABSOLUTE) 0.1 0.0 - 0.4 x10E3/uL   Basophils Absolute 0.0 0.0 - 0.2 x10E3/uL   Immature Granulocytes 0 Not Estab. %   Immature Grans (Abs) 0.0 0.0 - 0.1 x10E3/uL   Retic Ct Pct 4.1 (H) 0.6 - 2.6 %  Cannabinoid Conf, Ur  Result Value Ref Range   CANNABINOIDS Positive (A) Cutoff=50   Carboxy THC GC/MS Conf >750 Cutoff=15 ng/mL  Opiates Confirmation, Urine  Result Value Ref Range   Opiates Positive (A) Cutoff=300 ng/mL     Codeine Negative Cutoff=300     Morphine Positive (A)       Morphine Confirm >3000 Cutoff=300 ng/mL     Hydromorphone Positive (A)       Hydromorphone Confirm 448 Cutoff=300 ng/mL     Hydrocodone Negative Cutoff=300  Oxycodone/Oxymorphone, Confirm  Result Value Ref Range   OXYCODONE/OXYMORPH Positive (A) Cutoff=300   OXYCODONE Positive (A)    OXYCODONE 1,339 Cutoff=300 ng/mL   OXYMORPHONE Positive (A)    OXYMORPHONE (GC/MS) 1,663 Cutoff=300 ng/mL    Last CBC Lab Results  Component Value Date   WBC 6.9 10/02/2023   HGB 9.1 (L) 10/02/2023   HCT 26.2 (L) 10/02/2023   MCV 84.8 10/02/2023   MCH 29.4 10/02/2023   RDW 15.8 (H) 10/02/2023   PLT 418 (H) 10/02/2023   Last metabolic panel Lab Results  Component Value Date   GLUCOSE 111 (H) 10/02/2023   NA 137 10/02/2023   K 3.5 10/02/2023   CL 107 10/02/2023   CO2 24 10/02/2023   BUN 6 10/02/2023   CREATININE  0.45 10/02/2023   GFRNONAA >  60 10/02/2023   CALCIUM 8.6 (L) 10/02/2023   PHOS 3.6 05/22/2021   PROT 7.1 10/02/2023   ALBUMIN 3.9 10/02/2023   LABGLOB 3.2 09/20/2023   AGRATIO 1.2 08/24/2022   BILITOT 1.2 (H) 10/02/2023   ALKPHOS 55 10/02/2023   AST 34 10/02/2023   ALT 25 10/02/2023   ANIONGAP 6 10/02/2023   Last lipids No results found for: "CHOL", "HDL", "LDLCALC", "LDLDIRECT", "TRIG", "CHOLHDL" Last hemoglobin A1c No results found for: "HGBA1C" Last thyroid functions No results found for: "TSH", "T3TOTAL", "T4TOTAL", "THYROIDAB" Last vitamin D Lab Results  Component Value Date   VD25OH 29.0 (L) 09/20/2023   Last vitamin B12 and Folate Lab Results  Component Value Date   VITAMINB12 620 09/15/2018      The ASCVD Risk score (Arnett DK, et al., 2019) failed to calculate for the following reasons:   The 2019 ASCVD risk score is only valid for ages 17 to 81    Assessment & Plan:   Problem List Items Addressed This Visit       Other   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 Discussed perhaps adding disease modifying agent to assist with sickle cell disease.  Patient has been on hydroxyurea in the past, but has not patient over the past several years - Sickle Cell Panel  2. Chronic pain syndrome Patient's pain is moderately controlled on current medication regimen.  No changes will be made on today.   - [id number] 11+Oxyco+Alc+Crt-Bund     Return in about 3 months (around 12/21/2023) for sickle cell anemia.   Nolon Nations  APRN, MSN, FNP-C Patient Care [facility] [address] [place], [place] [address] [phone]

## 2023-09-21 LAB — CMP14+CBC/D/PLT+FER+RETIC+V...
ALT: 27 [IU]/L (ref 0–32)
AST: 34 [IU]/L (ref 0–40)
Albumin: 4.7 g/dL (ref 4.0–5.0)
Alkaline Phosphatase: 82 [IU]/L (ref 44–121)
BUN/Creatinine Ratio: 18 (ref 9–23)
BUN: 11 mg/dL (ref 6–20)
Basophils Absolute: 0 10*3/uL (ref 0.0–0.2)
Basos: 0 %
Bilirubin Total: 1.5 mg/dL — ABNORMAL HIGH (ref 0.0–1.2)
CO2: 21 mmol/L (ref 20–29)
Calcium: 9.9 mg/dL (ref 8.7–10.2)
Chloride: 104 mmol/L (ref 96–106)
Creatinine, Ser: 0.6 mg/dL (ref 0.57–1.00)
EOS (ABSOLUTE): 0.1 10*3/uL (ref 0.0–0.4)
Eos: 1 %
Ferritin: 451 ng/mL — ABNORMAL HIGH (ref 15–150)
Globulin, Total: 3.2 g/dL (ref 1.5–4.5)
Glucose: 82 mg/dL (ref 70–99)
Hematocrit: 29.3 % — ABNORMAL LOW (ref 34.0–46.6)
Hemoglobin: 9.7 g/dL — ABNORMAL LOW (ref 11.1–15.9)
Immature Grans (Abs): 0 10*3/uL (ref 0.0–0.1)
Immature Granulocytes: 0 %
Lymphocytes Absolute: 3.5 10*3/uL — ABNORMAL HIGH (ref 0.7–3.1)
Lymphs: 36 %
MCH: 29.5 pg (ref 26.6–33.0)
MCHC: 33.1 g/dL (ref 31.5–35.7)
MCV: 89 fL (ref 79–97)
Monocytes Absolute: 1 10*3/uL — ABNORMAL HIGH (ref 0.1–0.9)
Monocytes: 10 %
Neutrophils Absolute: 5 10*3/uL (ref 1.4–7.0)
Neutrophils: 53 %
Platelets: 490 10*3/uL — ABNORMAL HIGH (ref 150–450)
Potassium: 3.8 mmol/L (ref 3.5–5.2)
RBC: 3.29 x10E6/uL — ABNORMAL LOW (ref 3.77–5.28)
RDW: 15.3 % (ref 11.7–15.4)
Retic Ct Pct: 4.1 % — ABNORMAL HIGH (ref 0.6–2.6)
Sodium: 140 mmol/L (ref 134–144)
Total Protein: 7.9 g/dL (ref 6.0–8.5)
Vit D, 25-Hydroxy: 29 ng/mL — ABNORMAL LOW (ref 30.0–100.0)
WBC: 9.5 10*3/uL (ref 3.4–10.8)
eGFR: 124 mL/min/{1.73_m2} (ref >59)

## 2023-09-22 ENCOUNTER — Other Ambulatory Visit: Payer: Self-pay | Admitting: Family Medicine

## 2023-09-22 DIAGNOSIS — Z79891 Long term (current) use of opiate analgesic: Secondary | ICD-10-CM

## 2023-09-22 DIAGNOSIS — D571 Sickle-cell disease without crisis: Secondary | ICD-10-CM

## 2023-09-22 MED ORDER — OXYCODONE HCL 10 MG PO TABS: 10.0000 mg | ORAL_TABLET | Freq: Four times a day (QID) | ORAL | 0 refills | Status: DC | PRN

## 2023-09-22 NOTE — Telephone Encounter (Signed)
Reviewed PDMP substance reporting system prior to prescribing opiate medications. No inconsistencies noted.  Meds ordered this encounter  Medications   Oxycodone HCl 10 MG TABS    Sig: Take 1 tablet (10 mg total) by mouth every 6 (six) hours as needed (pain).    Dispense:  60 tablet    Refill:  0    Order Specific Question:   Supervising Provider    Answer:   [name], [name] [[id number]]   Carmen [name] Carmen Cuttino  APRN, MSN, FNP-C Patient Care Center [facility] [address]  [place], [place] [address] [phone]  

## 2023-09-27 LAB — OPIATES CONFIRMATION, URINE
Codeine: NEGATIVE
Hydrocodone: NEGATIVE
Hydromorphone Confirm: 448 ng/mL
Hydromorphone: POSITIVE — AB
Morphine Confirm: >3000 ng/mL
Morphine: POSITIVE — AB
Opiates: POSITIVE ng/mL — AB

## 2023-09-27 LAB — DRUG SCREEN 764883 11+OXYCO+ALC+CRT-BUND
Amphetamines, Urine: NEGATIVE ng/mL
BENZODIAZ UR QL: NEGATIVE ng/mL
Barbiturate: NEGATIVE ng/mL
Cocaine (Metabolite): NEGATIVE ng/mL
Creatinine: 101.7 mg/dL (ref 20.0–300.0)
Ethanol: NEGATIVE %
Meperidine: NEGATIVE ng/mL
Methadone Screen, Urine: NEGATIVE ng/mL
Phencyclidine: NEGATIVE ng/mL
Propoxyphene: NEGATIVE ng/mL
Tramadol: NEGATIVE ng/mL
pH, Urine: 5.4 (ref 4.5–8.9)

## 2023-09-27 LAB — OXYCODONE/OXYMORPHONE, CONFIRM
OXYCODONE/OXYMORPH: POSITIVE — AB
OXYCODONE: 1339 ng/mL
OXYCODONE: POSITIVE — AB
OXYMORPHONE (GC/MS): 1663 ng/mL
OXYMORPHONE: POSITIVE — AB

## 2023-09-27 LAB — CANNABINOID CONFIRMATION, UR
CANNABINOIDS: POSITIVE — AB
Carboxy THC GC/MS Conf: >750 ng/mL

## 2023-10-01 ENCOUNTER — Other Ambulatory Visit: Payer: Self-pay

## 2023-10-01 ENCOUNTER — Encounter (HOSPITAL_COMMUNITY): Payer: Self-pay

## 2023-10-01 ENCOUNTER — Inpatient Hospital Stay (HOSPITAL_COMMUNITY)
Admission: EM | Admit: 2023-10-01 | Discharge: 2023-10-03 | DRG: 812 | Disposition: A | Discharge status: Home / Self Care | Payer: MEDICAID | Attending: Internal Medicine | Admitting: Internal Medicine

## 2023-10-01 DIAGNOSIS — D638 Anemia in other chronic diseases classified elsewhere: Secondary | ICD-10-CM | POA: Diagnosis present

## 2023-10-01 DIAGNOSIS — F17210 Nicotine dependence, cigarettes, uncomplicated: Secondary | ICD-10-CM | POA: Diagnosis present

## 2023-10-01 DIAGNOSIS — F322 Major depressive disorder, single episode, severe without psychotic features: Secondary | ICD-10-CM | POA: Diagnosis present

## 2023-10-01 DIAGNOSIS — E876 Hypokalemia: Secondary | ICD-10-CM | POA: Diagnosis present

## 2023-10-01 DIAGNOSIS — Z832 Family history of diseases of the blood and blood-forming organs and certain disorders involving the immune mechanism: Secondary | ICD-10-CM

## 2023-10-01 DIAGNOSIS — G894 Chronic pain syndrome: Secondary | ICD-10-CM | POA: Diagnosis present

## 2023-10-01 DIAGNOSIS — F17200 Nicotine dependence, unspecified, uncomplicated: Secondary | ICD-10-CM | POA: Diagnosis present

## 2023-10-01 DIAGNOSIS — F331 Major depressive disorder, recurrent, moderate: Secondary | ICD-10-CM | POA: Diagnosis present

## 2023-10-01 DIAGNOSIS — Z888 Allergy status to other drugs, medicaments and biological substances status: Secondary | ICD-10-CM | POA: Diagnosis not present

## 2023-10-01 DIAGNOSIS — Z79891 Long term (current) use of opiate analgesic: Secondary | ICD-10-CM

## 2023-10-01 DIAGNOSIS — F6089 Other specific personality disorders: Secondary | ICD-10-CM | POA: Diagnosis present

## 2023-10-01 DIAGNOSIS — D5700 Hb-SS disease with crisis, unspecified: Principal | ICD-10-CM | POA: Diagnosis present

## 2023-10-01 DIAGNOSIS — Z720 Tobacco use: Secondary | ICD-10-CM | POA: Diagnosis present

## 2023-10-01 DIAGNOSIS — Z8249 Family history of ischemic heart disease and other diseases of the circulatory system: Secondary | ICD-10-CM | POA: Diagnosis not present

## 2023-10-01 DIAGNOSIS — Z79899 Other long term (current) drug therapy: Secondary | ICD-10-CM | POA: Diagnosis not present

## 2023-10-01 DIAGNOSIS — Z9081 Acquired absence of spleen: Secondary | ICD-10-CM

## 2023-10-01 DIAGNOSIS — F609 Personality disorder, unspecified: Secondary | ICD-10-CM | POA: Diagnosis present

## 2023-10-01 LAB — COMPREHENSIVE METABOLIC PANEL
ALT: 24 U/L (ref 0–44)
AST: 29 U/L (ref 15–41)
Albumin: 3.9 g/dL (ref 3.5–5.0)
Alkaline Phosphatase: 56 U/L (ref 38–126)
Anion gap: 7 (ref 5–15)
BUN: 9 mg/dL (ref 6–20)
CO2: 23 mmol/L (ref 22–32)
Calcium: 8.9 mg/dL (ref 8.9–10.3)
Chloride: 108 mmol/L (ref 98–111)
Creatinine, Ser: 0.61 mg/dL (ref 0.44–1.00)
GFR, Estimated: >60 mL/min (ref >60)
Glucose, Bld: 101 mg/dL — ABNORMAL HIGH (ref 70–99)
Potassium: 3.4 mmol/L — ABNORMAL LOW (ref 3.5–5.1)
Sodium: 138 mmol/L (ref 135–145)
Total Bilirubin: 0.9 mg/dL (ref <1.2)
Total Protein: 7.5 g/dL (ref 6.5–8.1)

## 2023-10-01 LAB — CBC WITH DIFFERENTIAL/PLATELET
Abs Immature Granulocytes: 0.1 10*3/uL — ABNORMAL HIGH (ref 0.00–0.07)
Basophils Absolute: 0 10*3/uL (ref 0.0–0.1)
Basophils Relative: 1 %
Eosinophils Absolute: 0.1 10*3/uL (ref 0.0–0.5)
Eosinophils Relative: 1 %
HCT: 25.5 % — ABNORMAL LOW (ref 36.0–46.0)
Hemoglobin: 9.3 g/dL — ABNORMAL LOW (ref 12.0–15.0)
Immature Granulocytes: 1 %
Lymphocytes Relative: 34 %
Lymphs Abs: 2.6 10*3/uL (ref 0.7–4.0)
MCH: 30.7 pg (ref 26.0–34.0)
MCHC: 36.5 g/dL — ABNORMAL HIGH (ref 30.0–36.0)
MCV: 84.2 fL (ref 80.0–100.0)
Monocytes Absolute: 0.8 10*3/uL (ref 0.1–1.0)
Monocytes Relative: 11 %
Neutro Abs: 3.9 10*3/uL (ref 1.7–7.7)
Neutrophils Relative %: 52 %
Platelets: 454 10*3/uL — ABNORMAL HIGH (ref 150–400)
RBC: 3.03 MIL/uL — ABNORMAL LOW (ref 3.87–5.11)
RDW: 15.8 % — ABNORMAL HIGH (ref 11.5–15.5)
WBC: 7.5 10*3/uL (ref 4.0–10.5)
nRBC: 1.3 % — ABNORMAL HIGH (ref 0.0–0.2)

## 2023-10-01 LAB — RETICULOCYTES
Immature Retic Fract: 41.5 % — ABNORMAL HIGH (ref 2.3–15.9)
RBC.: 3.05 MIL/uL — ABNORMAL LOW (ref 3.87–5.11)
Retic Count, Absolute: 193.1 10*3/uL — ABNORMAL HIGH (ref 19.0–186.0)
Retic Ct Pct: 6.3 % — ABNORMAL HIGH (ref 0.4–3.1)

## 2023-10-01 MED ORDER — DIPHENHYDRAMINE HCL 25 MG PO CAPS: 25.0000 mg | ORAL_CAPSULE | ORAL | Status: DC | PRN

## 2023-10-01 MED ORDER — PROCHLORPERAZINE EDISYLATE 10 MG/2ML IJ SOLN
5.0000 mg | Freq: Four times a day (QID) | INTRAMUSCULAR | Status: DC | PRN
  Administered 2023-10-02: 5 mg via INTRAVENOUS
  Filled 2023-10-01 (×2): qty 2

## 2023-10-01 MED ORDER — FOLIC ACID 1 MG PO TABS
1.0000 mg | ORAL_TABLET | Freq: Every day | ORAL | Status: DC
  Administered 2023-10-01 – 2023-10-03 (×3): 1 mg via ORAL
  Filled 2023-10-01 (×3): qty 1

## 2023-10-01 MED ORDER — DEXTROSE-SODIUM CHLORIDE 5-0.45 % IV SOLN
INTRAVENOUS | Status: AC
Start: 2023-10-01 — End: 2023-10-02

## 2023-10-01 MED ORDER — HYDROMORPHONE 1 MG/ML IV SOLN
INTRAVENOUS | Status: DC
  Administered 2023-10-01: 30 mg via INTRAVENOUS
  Administered 2023-10-01: 7.5 mg via INTRAVENOUS
  Administered 2023-10-02: 2 mg via INTRAVENOUS
  Administered 2023-10-02: 5.5 mg via INTRAVENOUS
  Administered 2023-10-02: 2.5 mg via INTRAVENOUS
  Administered 2023-10-02: 5.5 mg via INTRAVENOUS
  Administered 2023-10-02: 3.5 mg via INTRAVENOUS
  Administered 2023-10-02: 4 mg via INTRAVENOUS
  Administered 2023-10-02: 3.5 mg via INTRAVENOUS
  Administered 2023-10-03: 1.5 mg via INTRAVENOUS
  Administered 2023-10-03: 9.5 mg via INTRAVENOUS
  Administered 2023-10-03: 6 mg via INTRAVENOUS
  Filled 2023-10-01 (×3): qty 30

## 2023-10-01 MED ORDER — PROCHLORPERAZINE EDISYLATE 10 MG/2ML IJ SOLN: 10.0000 mg | Freq: Four times a day (QID) | INTRAMUSCULAR | Status: DC | PRN

## 2023-10-01 MED ORDER — MORPHINE SULFATE ER 15 MG PO TBCR
60.0000 mg | EXTENDED_RELEASE_TABLET | Freq: Two times a day (BID) | ORAL | Status: DC
  Administered 2023-10-01 – 2023-10-03 (×5): 60 mg via ORAL
  Filled 2023-10-01 (×5): qty 4

## 2023-10-01 MED ORDER — KETOROLAC TROMETHAMINE 15 MG/ML IJ SOLN
15.0000 mg | INTRAMUSCULAR | Status: AC
  Administered 2023-10-01: 15 mg via INTRAVENOUS
  Filled 2023-10-01: qty 1

## 2023-10-01 MED ORDER — HYDROMORPHONE HCL 2 MG/ML IJ SOLN
2.0000 mg | INTRAMUSCULAR | Status: AC
  Administered 2023-10-01: 2 mg via INTRAVENOUS
  Filled 2023-10-01: qty 1

## 2023-10-01 MED ORDER — ONDANSETRON HCL 4 MG/2ML IJ SOLN
4.0000 mg | Freq: Four times a day (QID) | INTRAMUSCULAR | Status: DC | PRN
Start: 2023-10-01 — End: 2023-10-03
  Administered 2023-10-01 – 2023-10-03 (×2): 4 mg via INTRAVENOUS
  Filled 2023-10-01 (×2): qty 2

## 2023-10-01 MED ORDER — HYDROMORPHONE HCL 2 MG/ML IJ SOLN
2.0000 mg | Freq: Once | INTRAMUSCULAR | Status: AC
  Administered 2023-10-01: 2 mg via INTRAVENOUS
  Filled 2023-10-01: qty 1

## 2023-10-01 MED ORDER — NALOXONE HCL 0.4 MG/ML IJ SOLN: 0.4000 mg | INTRAMUSCULAR | Status: DC | PRN

## 2023-10-01 MED ORDER — SODIUM CHLORIDE 0.9% FLUSH: 9.0000 mL | INTRAVENOUS | Status: DC | PRN

## 2023-10-01 MED ORDER — SENNOSIDES-DOCUSATE SODIUM 8.6-50 MG PO TABS
1.0000 | ORAL_TABLET | Freq: Two times a day (BID) | ORAL | Status: DC
  Administered 2023-10-01 – 2023-10-03 (×5): 1 via ORAL
  Filled 2023-10-01 (×5): qty 1

## 2023-10-01 MED ORDER — POLYETHYLENE GLYCOL 3350 17 G PO PACK: 17.0000 g | PACK | Freq: Every day | ORAL | Status: DC | PRN

## 2023-10-01 MED ORDER — ENOXAPARIN SODIUM 40 MG/0.4ML IJ SOSY
40.0000 mg | PREFILLED_SYRINGE | INTRAMUSCULAR | Status: DC
Start: 2023-10-01 — End: 2023-10-03
  Administered 2023-10-01: 40 mg via SUBCUTANEOUS
  Filled 2023-10-01: qty 0.4

## 2023-10-01 MED ORDER — ONDANSETRON HCL 4 MG/2ML IJ SOLN: 4.0000 mg | INTRAMUSCULAR | Status: DC | PRN

## 2023-10-01 MED ORDER — KETOROLAC TROMETHAMINE 15 MG/ML IJ SOLN
15.0000 mg | Freq: Four times a day (QID) | INTRAMUSCULAR | Status: DC
  Administered 2023-10-01 – 2023-10-03 (×8): 15 mg via INTRAVENOUS
  Filled 2023-10-01 (×8): qty 1

## 2023-10-01 MED ORDER — CYCLOBENZAPRINE HCL 10 MG PO TABS: 10.0000 mg | ORAL_TABLET | Freq: Every day | ORAL | Status: DC | PRN

## 2023-10-01 NOTE — ED Provider Notes (Signed)
[facility] AT [facility] Provider Note   CSN: [id number] Arrival date & time: 10/01/23  4098     History  Chief Complaint  Patient presents with   Sickle Cell Pain Crisis    Carmen [name] is a 32 y.o. female.  Patient with history of SSC presents with pain in upper back and shoulders over the last 2-3 days, typical of sickle cell pain. No fever, cough, SOB or chest pain. No vomiting. No urinary symptoms.   The history is provided by the patient. No language interpreter was used.  Sickle Cell Pain Crisis      Home Medications Prior to Admission medications   Medication Sig Start Date End Date Taking? Authorizing Provider  BENADRYL ALLERGY 25 MG capsule Take 25 mg by mouth every 6 (six) hours as needed for allergies.   Yes [provider]  cyclobenzaprine (FLEXERIL) 10 MG tablet Take 1 tablet (10 mg total) by mouth at bedtime. TAKE 1 TABLET BY MOUTH DAILY AS NEEDED FOR MUSCLE SPASMS. Patient taking differently: Take 10 mg by mouth daily as needed for muscle spasms. 06/14/23  Yes Massie Maroon, FNP  folic acid (FOLVITE) 1 MG tablet Take 1 tablet (1 mg total) by mouth daily. 03/15/19  Yes Mike Gip, FNP  morphine (MS CONTIN) 60 MG 12 hr tablet Take 1 tablet (60 mg total) by mouth every 12 (twelve) hours. 09/03/23  Yes Massie Maroon, FNP  Oxycodone HCl 10 MG TABS Take 1 tablet (10 mg total) by mouth every 6 (six) hours as needed (pain). 09/23/23  Yes Massie Maroon, FNP  Vitamin D, Ergocalciferol, (DRISDOL) 1.25 MG (50000 UNIT) CAPS capsule TAKE ONE CAPSULE BY MOUTH ONE TIME PER WEEK Patient taking differently: Take 50,000 Units by mouth every Monday. 08/24/22  Yes Massie Maroon, FNP      Allergies    Ketamine    Review of Systems   Review of Systems  Physical Exam Updated Vital Signs BP 115/77   Pulse 60   Temp 98 F (36.7 C) (Oral)   Resp 17   Ht 5\' 3"  (1.6 m)   Wt 86.2 kg   SpO2 96%   BMI 33.66 kg/m   Physical Exam Vitals and nursing note reviewed.  Constitutional:      Appearance: Normal appearance. She is well-developed.  HENT:     Head: Normocephalic.  Cardiovascular:     Rate and Rhythm: Normal rate and regular rhythm.     Heart sounds: No murmur heard. Pulmonary:     Effort: Pulmonary effort is normal.     Breath sounds: Normal breath sounds. No wheezing, rhonchi or rales.  Abdominal:     General: Bowel sounds are normal.     Palpations: Abdomen is soft.     Tenderness: There is no abdominal tenderness. There is no guarding or rebound.  Musculoskeletal:        General: Normal range of motion.     Cervical back: Normal range of motion and neck supple.     Comments: No swelling of upper back. Mildly tender across upper thoracic back. FROM UE's.   Skin:    General: Skin is warm and dry.     Findings: No erythema.  Neurological:     General: No focal deficit present.     Mental Status: She is alert and oriented to person, place, and time.    ED Results / Procedures / Treatments   Labs (all labs ordered are  listed, but only abnormal results are displayed) Labs Reviewed  CBC WITH DIFFERENTIAL/PLATELET - Abnormal; Notable for the following components:      Result Value   RBC 3.03 (*)    Hemoglobin 9.3 (*)    HCT 25.5 (*)    MCHC 36.5 (*)    RDW 15.8 (*)    Platelets 454 (*)    nRBC 1.3 (*)    Abs Immature Granulocytes 0.10 (*)    All other components within normal limits  RETICULOCYTES - Abnormal; Notable for the following components:   Retic Ct Pct 6.3 (*)    RBC. 3.05 (*)    Retic Count, Absolute 193.1 (*)    Immature Retic Fract 41.5 (*)    All other components within normal limits  COMPREHENSIVE METABOLIC PANEL - Abnormal; Notable for the following components:   Potassium 3.4 (*)    Glucose, Bld 101 (*)    All other components within normal limits    EKG None  Radiology No results found.  Procedures Procedures    Medications Ordered in  ED Medications  diphenhydrAMINE (BENADRYL) capsule 25-50 mg (has no administration in time range)  ondansetron (ZOFRAN) injection 4 mg (has no administration in time range)  HYDROmorphone (DILAUDID) injection 2 mg (2 mg Intravenous Given 10/01/23 1006)  HYDROmorphone (DILAUDID) injection 2 mg (2 mg Intravenous Given 10/01/23 1043)  ketorolac (TORADOL) 15 MG/ML injection 15 mg (15 mg Intravenous Given 10/01/23 1003)  HYDROmorphone (DILAUDID) injection 2 mg (2 mg Intravenous Given 10/01/23 1127)    ED Course/ Medical Decision Making/ A&P Clinical Course as of 10/01/23 1213  Sat Oct 01, 2023  1119 Recheck: patient has had 2 doses of 2 mg hydromorphone, 15 mg Toradol. Sh reports her pain is improving, left upper back/shoulder feels much better but right continues to cause pain. 3rd dose dilaudid ordered. Pending re-evaluation to determine disposition. [SU]  1211 After 3rd dose Dilaudid, she continues to have minimal improvement. Patient care discussed with Dr. Mikeal Hawthorne who will admit for further management.  [SU]    Clinical Course User Index [SU] Elpidio Anis, PA-C                                 Medical Decision Making Amount and/or Complexity of Data Reviewed Labs: ordered.  Risk Prescription drug management.           Final Clinical Impression(s) / ED Diagnoses Final diagnoses:  Sickle cell anemia with pain [facility])    Rx / DC Orders ED Discharge Orders     None         Elpidio Anis, PA-C 10/01/23 1214    Franne Forts, DO 10/07/23 2349

## 2023-10-01 NOTE — ED Triage Notes (Signed)
Patient reports sickle cell pain crisis with pain in bil shoulders x 3 days. Reports she took home oxycodone, morphine, and ibuprofen without relief.

## 2023-10-01 NOTE — Plan of Care (Signed)

## 2023-10-01 NOTE — ED Notes (Signed)
ED TO INPATIENT HANDOFF REPORT  Name/Age/Gender Carmen [name] 32 y.o. female  Code Status    Code Status Orders  (From admission, onward)           Start     Ordered   10/01/23 1245  Full code  Continuous       Question:  By:  Answer:  Consent: discussion documented in EHR   10/01/23 1247           Code Status History     Date Active Date Inactive Code Status Order ID Comments User Context   08/01/2023 1527 08/03/2023 1635 Full Code [id number]  Massie Maroon, FNP Inpatient   08/01/2023 1151 08/01/2023 1527 Full Code [id number]  Massie Maroon, FNP Inpatient   07/28/2023 1544 07/31/2023 1801 Full Code [id number]  Massie Maroon, FNP Inpatient   06/23/2023 1409 06/27/2023 1716 Full Code [id number]  Rometta Emery, MD ED   05/22/2023 1345 05/25/2023 1431 Full Code [id number]  Quentin Angst, MD ED   04/21/2023 1438 04/23/2023 1612 Full Code [id number]  Massie Maroon, FNP Inpatient   04/21/2023 1052 04/21/2023 1438 Full Code [id number]  Massie Maroon, FNP Inpatient   03/21/2023 0927 03/24/2023 1343 Full Code [id number]  Massie Maroon, FNP ED   02/15/2023 1435 02/21/2023 1524 Full Code [id number]  Massie Maroon, FNP Inpatient   02/15/2023 0947 02/15/2023 1435 Full Code [id number]  Massie Maroon, FNP Inpatient   12/16/2022 1357 12/20/2022 1717 Full Code [id number]  Massie Maroon, FNP Inpatient   12/16/2022 0921 12/16/2022 1357 Full Code [id number]  Massie Maroon, FNP Inpatient   11/14/2022 1743 11/18/2022 1810 Full Code [id number]  Rometta Emery, MD ED   09/11/2022 2207 09/17/2022 1724 Full Code [id number]  Lurline Del, MD ED   08/13/2022 1353 08/18/2022 1811 Full Code [id number]  Massie Maroon, FNP Inpatient   07/14/2022 1455 07/19/2022 1653 Full Code [id number]  Massie Maroon, FNP Inpatient   05/14/2022 1716 05/18/2022 1926 Full Code [id number]  Massie Maroon, FNP Inpatient   05/14/2022 1254 05/14/2022 1716 Full Code [id number]  Massie Maroon, FNP  Inpatient   05/10/2022 1903 05/12/2022 1720 Full Code [id number]  Massie Maroon, FNP Inpatient   04/13/2022 1028 04/13/2022 2248 Full Code [id number]  Massie Maroon, FNP Inpatient   04/08/2022 0736 04/12/2022 1932 Full Code [id number]  Therisa Doyne, MD Inpatient   03/28/2022 1838 04/02/2022 0114 Full Code [id number]  Quentin Angst, MD ED   03/01/2022 1827 03/05/2022 1712 Full Code [id number]  Lorin Glass, MD Inpatient   02/01/2022 1608 02/04/2022 1823 Full Code [id number]  Massie Maroon, FNP Inpatient   12/31/2021 1454 01/04/2022 1832 Full Code [id number]  Massie Maroon, FNP Inpatient   12/03/2021 0857 12/05/2021 1608 Full Code [id number]  Massie Maroon, FNP ED   11/01/2021 0914 11/04/2021 1921 Full Code [id number]  Quentin Angst, MD ED   11/01/2021 0222 11/01/2021 0914 Full Code [id number]  Vassie Loll, MD ED   10/26/2021 1608 10/30/2021 1635 Full Code [id number]  Massie Maroon, FNP Inpatient   10/20/2021 1733 10/23/2021 1850 Full Code [id number]  Rometta Emery, MD Inpatient   10/19/2021 0943 10/19/2021 2005 Full Code [id number]  Quentin Angst, MD Inpatient   09/05/2021 2130 09/09/2021 1818 Full Code [id number]  Marinda Elk, MD ED   08/21/2021 1618 08/24/2021 1943 Full Code [id number]  Massie Maroon, FNP Inpatient   07/24/2021 1532 07/26/2021 1633 Full  Code [id number]  Massie Maroon, FNP ED   06/29/2021 2038 07/01/2021 1929 Full Code [id number]  Benita Gutter T, DO ED   06/19/2021 1408 06/25/2021 1706 Full Code [id number]  Massie Maroon, FNP ED   05/21/2021 2206 05/26/2021 1652 Full Code [id number]  Therisa Doyne, MD ED   03/23/2021 1731 03/26/2021 1548 Full Code [id number]  Rometta Emery, MD Inpatient   03/23/2021 1026 03/23/2021 1731 Full Code [id number]  Rometta Emery, MD Inpatient   03/11/2021 1608 03/15/2021 1819 Full Code [id number]  Massie Maroon, FNP ED   02/16/2021 1847 02/19/2021 1754 Full Code [id number]  Rometta Emery, MD Inpatient   02/16/2021 0939  02/16/2021 1847 Full Code [id number]  Rometta Emery, MD Inpatient   01/18/2021 1408 01/20/2021 2145 Full Code [id number]  Quentin Angst, MD ED   10/13/2020 1728 10/16/2020 2138 Full Code [id number]  Massie Maroon, FNP Inpatient   10/13/2020 0934 10/13/2020 1728 Full Code [id number]  Massie Maroon, FNP Inpatient   09/08/2020 1751 09/11/2020 1942 Full Code [id number]  Massie Maroon, FNP Inpatient   09/08/2020 0959 09/08/2020 1751 Full Code [id number]  Massie Maroon, FNP Inpatient   07/10/2020 1723 07/14/2020 1628 Full Code [id number]  Massie Maroon, FNP Inpatient   06/09/2020 1733 06/11/2020 2048 Full Code [id number]  Massie Maroon, FNP Inpatient   06/09/2020 1238 06/09/2020 1733 Full Code [id number]  Massie Maroon, FNP Inpatient   05/11/2020 2225 05/13/2020 1700 Full Code [id number]  Hillary Bow, DO ED   05/04/2020 1341 05/06/2020 1555 Full Code [id number]  Massie Maroon, FNP Inpatient   04/29/2020 1601 05/02/2020 2249 Full Code [id number]  Massie Maroon, FNP Inpatient   04/29/2020 0933 04/29/2020 1601 Full Code [id number]  Massie Maroon, FNP Inpatient   03/31/2020 2108 04/02/2020 1542 Full Code [id number]  Therisa Doyne, MD Inpatient   01/01/2020 1810 01/03/2020 1948 Full Code [id number]  Massie Maroon, FNP ED   12/06/2019 1237 12/06/2019 2319 Full Code [id number]  Massie Maroon, FNP Inpatient   11/07/2019 1044 11/07/2019 2335 Full Code [id number]  Massie Maroon, FNP Inpatient   09/10/2019 0956 09/10/2019 1825 Full Code [id number]  Massie Maroon, FNP Inpatient   02/04/2019 1502 02/05/2019 1543 Full Code [id number]  Arvilla Market, DO Inpatient   01/25/2019 1433 02/02/2019 1735 Full Code [id number]  Hermina Staggers, MD Inpatient   01/25/2019 0405 01/25/2019 1433 Full Code [id number]  Briscoe Deutscher, MD ED   12/04/2018 0855 12/13/2018 1630 Full Code [id number]  Massie Maroon, FNP ED   11/04/2018 1249 11/10/2018 1454 Full Code [id number]  Rometta Emery, MD Inpatient   10/07/2018  1522 10/13/2018 2116 Full Code [id number]  Rometta Emery, MD ED   08/25/2017 0200 08/30/2017 1810 Full Code [id number]  Clydie Braun, MD ED   08/21/2017 0232 08/23/2017 1549 Full Code [id number]  Lorretta Harp, MD ED   07/17/2017 1314 07/20/2017 1853 Full Code [id number]  Rometta Emery, MD Inpatient   06/27/2017 0610 06/28/2017 1931 Full Code [id number]  Eduard Clos, MD ED   06/25/2017 1408 06/25/2017 2232 Full Code [id number]  Altha Harm, MD Inpatient   06/21/2017 0019 06/24/2017 2204 Full Code [id number]  Hillary Bow, DO ED   05/29/2017 0147 05/31/2017 1353 Full Code [id number]  Hillary Bow, DO ED   05/23/2017 0336 05/24/2017 1817 Full Code [id number]  Briscoe Deutscher, MD ED   05/18/2017 0211 05/22/2017 1416 Full Code  [id number]  Bobette Mo, MD Inpatient   05/18/2017 0211 05/18/2017 0211 Full Code [id number]  Bobette Mo, MD Inpatient   03/23/2017 717 262 3200 03/25/2017 1907 Full Code [id number]  Clydie Braun, MD ED   03/19/2017 0456 03/19/2017 1534 Full Code [id number]  Michael Litter, MD ED   01/19/2017 2128 01/25/2017 2104 Full Code [id number]  Hollice Espy, MD Inpatient   12/24/2016 0933 12/28/2016 1853 Full Code [id number]  Quentin Angst, MD Inpatient   11/30/2016 1020 11/30/2016 2123 Full Code [id number]  Quentin Angst, MD Inpatient   08/30/2016 0503 09/05/2016 1511 Full Code [id number]  Clydie Braun, MD ED   08/16/2016 0049 08/23/2016 2056 Full Code [id number]  Alberteen Sam, MD ED   06/14/2016 1655 06/19/2016 1703 Full Code [id number]  Rometta Emery, MD Inpatient   05/14/2016 2103 05/22/2016 2028 Full Code [id number]  Eddie North, MD Inpatient   04/19/2016 1519 04/26/2016 2049 Full Code [id number]  Altha Harm, MD Inpatient   02/10/2016 2022 02/15/2016 1859 Full Code [id number]  Briscoe Deutscher, MD ED   12/19/2015 1324 12/25/2015 2045 Full Code [id number]  Rometta Emery, MD Inpatient   12/19/2015 0746 12/19/2015 1324 Full Code [id number]   Richarda Overlie, MD ED   11/11/2015 0409 11/17/2015 2103 Full Code [id number]  Therisa Doyne, MD ED   10/17/2015 1248 10/24/2015 2031 Full Code [id number]  Altha Harm, MD ED   10/07/2015 1809 10/14/2015 2058 Full Code [id number]  Alison Murray, MD ED   09/09/2015 0207 09/14/2015 2131 Full Code [id number]  Alberteen Sam, MD Inpatient   03/12/2015 0338 03/18/2015 1702 Full Code [id number]  Eduard Clos, MD Inpatient   02/28/2015 2122 03/06/2015 1547 Full Code [id number]  Therisa Doyne, MD Inpatient   02/25/2015 1720 02/27/2015 1336 Full Code [id number]  Osvaldo Shipper, MD Inpatient   02/16/2015 0043 02/22/2015 1921 Full Code [id number]  Rolly Salter, MD ED   11/28/2014 2048 12/02/2014 1435 Full Code [id number]  Catarina Hartshorn, MD Inpatient   11/22/2014 2314 11/24/2014 1658 Full Code [id number]  Ron Parker, MD Inpatient   05/16/2014 0631 05/18/2014 1649 Full Code [id number]  Eduard Clos, MD Inpatient   11/12/2013 2227 11/13/2013 1851 Full Code [id number]  Hollice Espy, MD Inpatient       Home/SNF/Other Home  Chief Complaint Sickle cell disease with crisis [facility]) [D57.00]  Level of Care/Admitting Diagnosis ED Disposition     ED Disposition  Admit   Condition  --   Comment  Hospital Area: [facility] [[id number]]  Level of Care: Med-Surg [16]  May admit patient to [facility] or [facility] if equivalent level of care is available:: No  Covid Evaluation: Asymptomatic - no recent exposure (last 10 days) testing not required  Diagnosis: Sickle cell disease with crisis [facility]) [[id number]]  Admitting Physician: Rometta Emery [[id number]]  Attending Physician: Rometta Emery [[id number]]  Certification:: I certify this patient will need inpatient services for at least 2 midnights  Expected Medical Readiness: 10/04/2023          Medical History Past Medical History:  Diagnosis Date   Anxiety    Headache(784.0)    Heart murmur    Sickle cell  crisis [facility])    Syphilis 2015   Was diagnosed and received one injection of antibiotics   Thrombocytosis 11/22/2014     CBC Latest Ref Rng & Units 12/13/2018 12/11/2018 12/10/2018 WBC 4.0 - 10.5 K/uL 14.8(H) 13.2(H) 15.9(H) Hemoglobin 12.0 -  15.0 g/dL 8.2(L) 7.7(L) 8.1(L) Hematocrit 36.0 - 46.0 % 23.7(L) 23.2(L) 24.5(L) Platelets 150 - 400 K/uL 326 399 388      Allergies Allergies  Allergen Reactions   Ketamine Hives, Nausea And Vomiting and Other (See Comments)    Makes the patient "feel funny" also    IV Location/Drains/Wounds Patient Lines/Drains/Airways Status     Active Line/Drains/Airways     Name Placement date Placement time Site Days   Peripheral IV 10/01/23 20 G 2.5" Right Antecubital 10/01/23  1000  Antecubital  less than 1            Labs/Imaging Results for orders placed or performed during the hospital encounter of 10/01/23 (from the past 48 hour(s))  CBC WITH DIFFERENTIAL     Status: Abnormal   Collection Time: 10/01/23 10:02 AM  Result Value Ref Range   WBC 7.5 4.0 - 10.5 K/uL   RBC 3.03 (L) 3.87 - 5.11 MIL/uL   Hemoglobin 9.3 (L) 12.0 - 15.0 g/dL   HCT 13.0 (L) 86.5 - 78.4 %   MCV 84.2 80.0 - 100.0 fL   MCH 30.7 26.0 - 34.0 pg   MCHC 36.5 (H) 30.0 - 36.0 g/dL   RDW 69.6 (H) 29.5 - 28.4 %   Platelets 454 (H) 150 - 400 K/uL   nRBC 1.3 (H) 0.0 - 0.2 %   Neutrophils Relative % 52 %   Neutro Abs 3.9 1.7 - 7.7 K/uL   Lymphocytes Relative 34 %   Lymphs Abs 2.6 0.7 - 4.0 K/uL   Monocytes Relative 11 %   Monocytes Absolute 0.8 0.1 - 1.0 K/uL   Eosinophils Relative 1 %   Eosinophils Absolute 0.1 0.0 - 0.5 K/uL   Basophils Relative 1 %   Basophils Absolute 0.0 0.0 - 0.1 K/uL   Immature Granulocytes 1 %   Abs Immature Granulocytes 0.10 (H) 0.00 - 0.07 K/uL    Comment: Performed at [facility], [address], [place], [place] [address]  Reticulocytes     Status: Abnormal   Collection Time: 10/01/23 10:02 AM  Result Value Ref Range   Retic  Ct Pct 6.3 (H) 0.4 - 3.1 %   RBC. 3.05 (L) 3.87 - 5.11 MIL/uL   Retic Count, Absolute 193.1 (H) 19.0 - 186.0 K/uL   Immature Retic Fract 41.5 (H) 2.3 - 15.9 %    Comment: Performed at [facility], [address] [place], [place] [address]  Comprehensive metabolic panel     Status: Abnormal   Collection Time: 10/01/23 10:02 AM  Result Value Ref Range   Sodium 138 135 - 145 mmol/L   Potassium 3.4 (L) 3.5 - 5.1 mmol/L   Chloride 108 98 - 111 mmol/L   CO2 23 22 - 32 mmol/L   Glucose, Bld 101 (H) 70 - 99 mg/dL    Comment: Glucose reference range applies only to samples taken after fasting for at least 8 hours.   BUN 9 6 - 20 mg/dL   Creatinine, Ser 2.53 0.44 - 1.00 mg/dL   Calcium 8.9 8.9 - 66.4 mg/dL   Total Protein 7.5 6.5 - 8.1 g/dL   Albumin 3.9 3.5 - 5.0 g/dL   AST 29 15 - 41 U/L   ALT 24 0 - 44 U/L   Alkaline Phosphatase 56 38 - 126 U/L   Total Bilirubin 0.9 <1.2 mg/dL   GFR, Estimated >40 >34 mL/min    Comment: (NOTE) Calculated using the CKD-EPI Creatinine Equation (2021)  Anion gap 7 5 - 15    Comment: Performed at [facility], [address] [place], [place] [address]   *Note: Due to a large number of results and/or encounters for the requested time period, some results have not been displayed. A complete set of results can be found in Results Review.   No results found.  Pending Labs [employer] (From admission, onward)     Start     Ordered   Signed and Held  CBC  (enoxaparin (LOVENOX)    CrCl >/= 30 ml/min)  Once,   R       Comments: Baseline for enoxaparin therapy IF NOT ALREADY DRAWN.  Notify MD if PLT < 100 K.    Signed and Held   Signed and Held  Creatinine, serum  (enoxaparin (LOVENOX)    CrCl >/= 30 ml/min)  Once,   R       Comments: Baseline for enoxaparin therapy IF NOT ALREADY DRAWN.    Signed and Held   Signed and Held  Creatinine, serum  (enoxaparin (LOVENOX)    CrCl >/= 30 ml/min)  Weekly,   R      Comments: while on enoxaparin therapy    Signed and Held   Signed and Held  CBC with Differential/Platelet  Tomorrow morning,   R        Signed and Held   Signed and Held  Comprehensive metabolic panel  Tomorrow morning,   R        Signed and Held            Vitals/Pain Today's Vitals   10/01/23 1130 10/01/23 1158 10/01/23 1200 10/01/23 1314  BP: 115/77  129/87   Pulse: 60  (!) 58   Resp: 17  17   Temp:    98.1 F (36.7 C)  TempSrc:    Oral  SpO2: 96%  95%   Weight:  86.2 kg    Height:      PainSc: 8        Isolation Precautions No active isolations  Medications Medications  diphenhydrAMINE (BENADRYL) capsule 25-50 mg (has no administration in time range)  ondansetron (ZOFRAN) injection 4 mg (has no administration in time range)  morphine (MS CONTIN) 12 hr tablet 60 mg (60 mg Oral Given 10/01/23 1321)  cyclobenzaprine (FLEXERIL) tablet 10 mg (has no administration in time range)  naloxone (NARCAN) injection 0.4 mg (has no administration in time range)    And  sodium chloride flush (NS) 0.9 % injection 9 mL (has no administration in time range)  ketorolac (TORADOL) 15 MG/ML injection 15 mg (15 mg Intravenous Given 10/01/23 1321)  HYDROmorphone (DILAUDID) injection 2 mg (2 mg Intravenous Given 10/01/23 1006)  HYDROmorphone (DILAUDID) injection 2 mg (2 mg Intravenous Given 10/01/23 1043)  ketorolac (TORADOL) 15 MG/ML injection 15 mg (15 mg Intravenous Given 10/01/23 1003)  HYDROmorphone (DILAUDID) injection 2 mg (2 mg Intravenous Given 10/01/23 1127)    Mobility walks

## 2023-10-01 NOTE — H&P (Signed)
History and Physical    Patient: Carmen [name] WUJ:[id number] DOB: November 27, 1990 DOA: 10/01/2023 DOS: the patient was seen and examined on 10/01/2023 PCP: Carmen Maroon, FNP  Patient coming from: Home  Chief Complaint:  Chief Complaint  Patient presents with   Sickle Cell Pain Crisis   HPI: Carmen [name] is a 32 y.o. female with medical history significant of Sickle Cell disease, anxiety with depression, chronic pain syndrome who presented to the ER with 3 days of pain consistent with her typical sickle cell crisis.  Patient has taken her long and short acting pain medications but no relief.  Patient also took ibuprofen but still in pain.  Came to the ER where she was seen and evaluated.  She was given up to 6 mg of IV Dilaudid so far.  Patient's pain has improved but not good.  Patient is being admitted with acute sickle cell pain crisis.  Review of Systems: As mentioned in the history of present illness. All other systems reviewed and are negative. Past Medical History:  Diagnosis Date   Anxiety    Headache(784.0)    Heart murmur    Sickle cell crisis (HCC)    Syphilis 2015   Was diagnosed and received one injection of antibiotics   Thrombocytosis 11/22/2014     CBC Latest Ref Rng & Units 12/13/2018 12/11/2018 12/10/2018 WBC 4.0 - 10.5 K/uL 14.8(H) 13.2(H) 15.9(H) Hemoglobin 12.0 - 15.0 g/dL 8.2(L) 7.7(L) 8.1(L) Hematocrit 36.0 - 46.0 % 23.7(L) 23.2(L) 24.5(L) Platelets 150 - 400 K/uL 326 399 388     Past Surgical History:  Procedure Laterality Date   CESAREAN SECTION N/A 02/05/2019   Procedure: CESAREAN SECTION;  Surgeon: Carmen Staggers, MD;  Location: [facility] LD ORS;  Service: Obstetrics;  Laterality: N/A;   CHOLECYSTECTOMY N/A 11/30/2014   Procedure: LAPAROSCOPIC CHOLECYSTECTOMY SINGLE SITE WITH INTRAOPERATIVE CHOLANGIOGRAM;  Surgeon: Carmen Soda, MD;  Location: [facility] ORS;  Service: General;  Laterality: N/A;   SPLENECTOMY     Social History:  reports that she has been smoking  cigarettes. She has never used smokeless tobacco. She reports that she does not drink alcohol and does not use drugs.  Allergies  Allergen Reactions   Ketamine Hives, Nausea And Vomiting and Other (See Comments)    Makes the patient "feel funny" also    Family History  Problem Relation Age of Onset   Hypertension Mother    Sickle cell anemia Sister    Kidney disease Sister        Lupus   Arthritis Sister    Sickle cell anemia Sister    Sickle cell trait Sister    Heart disease Maternal Aunt        CABG   Heart disease Maternal Uncle        CABG   Lupus Sister     Prior to Admission medications   Medication Sig Start Date End Date Taking? Authorizing Provider  BENADRYL ALLERGY 25 MG capsule Take 25 mg by mouth every 6 (six) hours as needed for allergies.   Yes [provider]  cyclobenzaprine (FLEXERIL) 10 MG tablet Take 1 tablet (10 mg total) by mouth at bedtime. TAKE 1 TABLET BY MOUTH DAILY AS NEEDED FOR MUSCLE SPASMS. Patient taking differently: Take 10 mg by mouth daily as needed for muscle spasms. 06/14/23  Yes Carmen Maroon, FNP  folic acid (FOLVITE) 1 MG tablet Take 1 tablet (1 mg total) by mouth daily. 03/15/19  Yes Mike Gip, FNP  morphine (  MS CONTIN) 60 MG 12 hr tablet Take 1 tablet (60 mg total) by mouth every 12 (twelve) hours. 09/03/23  Yes Carmen Maroon, FNP  Oxycodone HCl 10 MG TABS Take 1 tablet (10 mg total) by mouth every 6 (six) hours as needed (pain). 09/23/23  Yes Carmen Maroon, FNP  Vitamin D, Ergocalciferol, (DRISDOL) 1.25 MG (50000 UNIT) CAPS capsule TAKE ONE CAPSULE BY MOUTH ONE TIME PER WEEK Patient taking differently: Take 50,000 Units by mouth every Monday. 08/24/22  Yes Carmen Maroon, FNP    Physical Exam: Vitals:   10/01/23 0930 10/01/23 1130 10/01/23 1158 10/01/23 1200  BP: (!) 133/91 115/77  129/87  Pulse: 77 60  (!) 58  Resp: 16 17  17   Temp: 98 F (36.7 C)     TempSrc: Oral     SpO2: 97% 96%  95%  Weight:   86.2  kg   Height:       Constitutional: NAD, calm, comfortable Eyes: PERRL, lids and conjunctivae normal ENMT: Mucous membranes are moist. Posterior pharynx clear of any exudate or lesions.Normal dentition.  Neck: normal, supple, no masses, no thyromegaly Respiratory: clear to auscultation bilaterally, no wheezing, no crackles. Normal respiratory effort. No accessory muscle use.  Cardiovascular: Regular rate and rhythm, no murmurs / rubs / gallops. No extremity edema. 2+ pedal pulses. No carotid bruits.  Abdomen: no tenderness, no masses palpated. No hepatosplenomegaly. Bowel sounds positive.  Musculoskeletal: Good range of motion, no joint swelling or tenderness, Skin: no rashes, lesions, ulcers. No induration Neurologic: CN 2-12 grossly intact. Sensation intact, DTR normal. Strength 5/5 in all 4.  Psychiatric: Normal judgment and insight. Alert and oriented x 3. Normal mood  Data Reviewed:  Blood pressure 133/91, potassium 3.4, glucose 101, hemoglobin 9.3 and platelets 454 reticulocyte count 193  Assessment and Plan:  #1 sickle cell pain crisis: Patient will be admitted.  Initiate Dilaudid PCA, Toradol and IV fluids.  Patient to continue long-acting pain medications in addition to the PCA.  #2 chronic pain syndrome: Continue MS Contin.  #3 anemia of chronic disease: H&H appears to be at baseline.  Continue to monitor.  #4 hypokalemia: Replete potassium.  #5 tobacco abuse: Tobacco cessation counseling  #6 cluster B personality disorder: Continue home regimen   Advance Care Planning:   Code Status: Full Code   Consults: None  Family Communication: No family at bedside  Severity of Illness: The appropriate patient status for this patient is INPATIENT. Inpatient status is judged to be reasonable and necessary in order to provide the required intensity of service to ensure the patient's safety. The patient's presenting symptoms, physical exam findings, and initial radiographic and  laboratory data in the context of their chronic comorbidities is felt to place them at high risk for further clinical deterioration. Furthermore, it is not anticipated that the patient will be medically stable for discharge from the hospital within 2 midnights of admission.   * I certify that at the point of admission it is my clinical judgment that the patient will require inpatient hospital care spanning beyond 2 midnights from the point of admission due to high intensity of service, high risk for further deterioration and high frequency of surveillance required.*  AuthorLonia Blood, MD 10/01/2023 12:47 PM  For on call review [email or web address].

## 2023-10-02 DIAGNOSIS — D5700 Hb-SS disease with crisis, unspecified: Secondary | ICD-10-CM | POA: Diagnosis not present

## 2023-10-02 LAB — COMPREHENSIVE METABOLIC PANEL
ALT: 25 U/L (ref 0–44)
AST: 34 U/L (ref 15–41)
Albumin: 3.9 g/dL (ref 3.5–5.0)
Alkaline Phosphatase: 55 U/L (ref 38–126)
Anion gap: 6 (ref 5–15)
BUN: 6 mg/dL (ref 6–20)
CO2: 24 mmol/L (ref 22–32)
Calcium: 8.6 mg/dL — ABNORMAL LOW (ref 8.9–10.3)
Chloride: 107 mmol/L (ref 98–111)
Creatinine, Ser: 0.45 mg/dL (ref 0.44–1.00)
GFR, Estimated: >60 mL/min (ref >60)
Glucose, Bld: 111 mg/dL — ABNORMAL HIGH (ref 70–99)
Potassium: 3.5 mmol/L (ref 3.5–5.1)
Sodium: 137 mmol/L (ref 135–145)
Total Bilirubin: 1.2 mg/dL — ABNORMAL HIGH (ref <1.2)
Total Protein: 7.1 g/dL (ref 6.5–8.1)

## 2023-10-02 LAB — CBC WITH DIFFERENTIAL/PLATELET
Abs Immature Granulocytes: 0.04 10*3/uL (ref 0.00–0.07)
Basophils Absolute: 0 10*3/uL (ref 0.0–0.1)
Basophils Relative: 0 %
Eosinophils Absolute: 0.1 10*3/uL (ref 0.0–0.5)
Eosinophils Relative: 1 %
HCT: 26.2 % — ABNORMAL LOW (ref 36.0–46.0)
Hemoglobin: 9.1 g/dL — ABNORMAL LOW (ref 12.0–15.0)
Immature Granulocytes: 1 %
Lymphocytes Relative: 46 %
Lymphs Abs: 3.2 10*3/uL (ref 0.7–4.0)
MCH: 29.4 pg (ref 26.0–34.0)
MCHC: 34.7 g/dL (ref 30.0–36.0)
MCV: 84.8 fL (ref 80.0–100.0)
Monocytes Absolute: 0.8 10*3/uL (ref 0.1–1.0)
Monocytes Relative: 11 %
Neutro Abs: 2.9 10*3/uL (ref 1.7–7.7)
Neutrophils Relative %: 41 %
Platelets: 418 10*3/uL — ABNORMAL HIGH (ref 150–400)
RBC: 3.09 MIL/uL — ABNORMAL LOW (ref 3.87–5.11)
RDW: 15.8 % — ABNORMAL HIGH (ref 11.5–15.5)
WBC: 6.9 10*3/uL (ref 4.0–10.5)
nRBC: 1.2 % — ABNORMAL HIGH (ref 0.0–0.2)

## 2023-10-02 NOTE — Progress Notes (Signed)
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  We will continue. Anemia of chronic disease: Continue to monitor H&H.  Seems to be at baseline Hypokalemia: Potassium is repleted.  Continue to monitor Tobacco abuse: Counseling provided.  Offered nicotine patch Major depressive disorder: Continue home regimen.  Code Status: Full code Family Communication: No family at bedside Disposition Plan: Home when ready  [facility]  Pager [phone]. If 7PM-7AM, please contact night-coverage.  10/02/2023, 2:34 PM  LOS: 1 day   Brief narrative: Carmen [name] is a 32 y.o. female with medical history significant of Sickle Cell disease, anxiety with depression, chronic pain syndrome who presented to the ER with 3 days of pain consistent with her typical sickle cell crisis.  Patient has taken her long and short acting pain medications but no relief.  Patient also took ibuprofen but still in pain.  Came to the ER where she was seen and evaluated.  She was given up to 6 mg of IV Dilaudid so far.  Patient's pain has improved but not good.  Patient is being admitted with acute sickle cell pain crisis.   Consultants: None  Procedures: Chest x-ray  Antibiotics: None  HPI/Subjective: Patient still has pain at 9 out of 10 in back and legs.  No other issues.  Objective: Vitals:   10/02/23 0811 10/02/23 1040 10/02/23 1231 10/02/23 1349  BP:  117/71    Pulse:  (!) 58    Resp: 11 16 13 12   Temp:  98.3 F (36.8 C)     TempSrc:  Oral    SpO2: 97% 99% 96% 94%  Weight:      Height:       Weight change:   Intake/Output Summary (Last 24 hours) at 10/02/2023 1434 Last data filed at 10/02/2023 0900 Gross per 24 hour  Intake 2197.74 ml  Output --  Net 2197.74 ml    General: Alert, awake, oriented x3, in no acute distress.  HEENT: [place]/AT PEERL, EOMI Neck: Trachea midline,  no masses, no thyromegal,y no JVD, no carotid bruit OROPHARYNX:  Moist, No exudate/ erythema/lesions.  Heart: Regular rate and rhythm, without murmurs, rubs, gallops, PMI non-displaced, no heaves or thrills on palpation.  Lungs: Clear to auscultation, no wheezing or rhonchi noted. No increased vocal fremitus resonant to percussion  Abdomen: Soft, nontender, nondistended, positive bowel sounds, no masses no hepatosplenomegaly noted..  Neuro: No focal neurological deficits noted cranial nerves II through XII grossly intact. DTRs 2+ bilaterally upper and lower extremities. Strength 5 out of 5 in bilateral upper and lower extremities. Musculoskeletal: No warm swelling or erythema around joints, no spinal tenderness noted. Psychiatric: Patient alert and oriented x3, good insight and cognition, good recent to remote recall. Lymph node survey: No cervical axillary or inguinal lymphadenopathy noted.   Data Reviewed: Basic Metabolic Panel: Recent Labs  Lab 10/01/23 1002 10/02/23 0551  NA 138 137  K 3.4* 3.5  CL 108 107  CO2 23 24  GLUCOSE 101* 111*  BUN 9 6  CREATININE 0.61  0.45  CALCIUM 8.9 8.6*   Liver Function Tests: Recent Labs  Lab 10/01/23 1002 10/02/23 0551  AST 29 34  ALT 24 25  ALKPHOS 56 55  BILITOT 0.9 1.2*  PROT 7.5 7.1  ALBUMIN 3.9 3.9   No results for input(s): "LIPASE", "AMYLASE" in the last 168 hours. No results for input(s): "AMMONIA" in the last 168 hours. CBC: Recent Labs  Lab 10/01/23 1002 10/02/23 0551  WBC 7.5 6.9  NEUTROABS 3.9 2.9  HGB 9.3* 9.1*  HCT 25.5* 26.2*  MCV 84.2 84.8  PLT 454*  418*   Cardiac Enzymes: No results for input(s): "CKTOTAL", "CKMB", "CKMBINDEX", "TROPONINI" in the last 168 hours. BNP (last 3 results) No results for input(s): "BNP" in the last 8760 hours.  ProBNP (last 3 results) No results for input(s): "PROBNP" in the last 8760 hours.  CBG: No results for input(s): "GLUCAP" in the last 168 hours.  No results found for this or any previous visit (from the past 240 hour(s)).   Studies: No results found.  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

## 2023-10-02 NOTE — Plan of Care (Signed)

## 2023-10-03 ENCOUNTER — Other Ambulatory Visit: Payer: Self-pay | Admitting: Internal Medicine

## 2023-10-03 DIAGNOSIS — D5700 Hb-SS disease with crisis, unspecified: Secondary | ICD-10-CM | POA: Diagnosis not present

## 2023-10-03 DIAGNOSIS — Z79891 Long term (current) use of opiate analgesic: Secondary | ICD-10-CM

## 2023-10-03 DIAGNOSIS — D571 Sickle-cell disease without crisis: Secondary | ICD-10-CM

## 2023-10-03 MED ORDER — MORPHINE SULFATE ER 60 MG PO TBCR: 60.0000 mg | EXTENDED_RELEASE_TABLET | Freq: Two times a day (BID) | ORAL | 0 refills | Status: DC

## 2023-10-03 NOTE — Discharge Summary (Signed)
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 Schedule an appointment as soon as possible for a visit in 1 week(s).   Specialty: Family Medicine Contact information: [address] [place] [address] [phone]                Pt is discharged home in good condition and is to follow up with Massie Maroon, FNP this week to have labs evaluated. Carmen [name] is instructed to increase activity slowly and balance with rest for the next few days, and use prescribed medication to complete treatment of pain  Diet: Regular Wt Readings from Last 3 Encounters:  10/01/23 86.2 kg  09/20/23 81.6 kg  08/01/23 86.2 kg    History of present illness:  Carmen [name] is a 32 y.o. female with medical history significant of Sickle Cell disease, anxiety with depression, chronic pain syndrome who presented to the ER with 3 days of pain consistent with her typical sickle cell crisis.  Patient has taken her long and short acting pain medications but no relief.  Patient also took ibuprofen but still in pain.  Came to the ER where she was seen and evaluated.  She was given up to 6 mg of IV Dilaudid so far.  Patient's pain has improved but not good.  Patient is being admitted with acute sickle cell pain crisis.   Hospital Course:  Patient was admitted for sickle cell pain crisis and managed appropriately with IVF, IV Dilaudid via PCA and IV Toradol, as well  as other adjunct therapies per sickle cell pain management protocols.  Patient did well on this regimen, pain slowly returned to baseline.  Hemoglobin remained stable at baseline throughout this admission, she did not require any blood transfusion.  As at today, patient is ambulating well with no significant pain, tolerating p.o. intake with no restrictions.  Patient requested to be discharged home, saying her pain can be managed appropriately at home.  She has no contraindication to being discharge.  Patient was therefore discharged home today in a hemodynamically stable condition.   Carmen [name] will follow-up with PCP within 1 week of this discharge. Carmen [name] was counseled extensively about nonpharmacologic means of pain management, patient verbalized understanding and was appreciative of  the care received during this admission.   We discussed the need for good hydration, monitoring of hydration status, avoidance of heat, cold, stress, and infection triggers. We discussed the need to be adherent with taking Hydrea and other home medications. Patient was reminded of the need to seek medical attention immediately if any symptom of bleeding, anemia, or infection occurs.  Discharge Exam: Vitals:   10/03/23 0832 10/03/23 0845  BP:  137/85  Pulse:  82  Resp: 19 18  Temp:  (!) 97.5 F (36.4 C)  SpO2: 97% 100%   Vitals:   10/03/23 0030 10/03/23 0437 10/03/23 0832 10/03/23 0845  BP:  126/81  137/85  Pulse:  65  82  Resp: 12 18 19 18   Temp:  98.6 F (37 C)  (!) 97.5 F (36.4 C)  TempSrc:  Oral  Oral  SpO2: 98% 97% 97% 100%  Weight:      Height:        General appearance : Awake, alert, not in any distress. Speech Clear. Not toxic looking HEENT: Atraumatic and Normocephalic, pupils equally reactive to light and accomodation Neck: Supple, no JVD. No cervical lymphadenopathy.  Chest: Good air entry bilaterally, no added sounds  CVS: S1 S2 regular, no murmurs.  Abdomen: Bowel sounds present, Non  tender and not distended with no gaurding, rigidity or rebound. Extremities: B/L Lower Ext shows no edema, both legs are warm to touch Neurology: Awake alert, and oriented X 3, CN II-XII intact, Non focal Skin: No Rash  Discharge Instructions  Discharge Instructions     Diet - low sodium heart healthy   Complete by: As directed    Increase activity slowly   Complete by: As directed       Allergies as of 10/03/2023       Reactions   Ketamine Hives, Nausea And Vomiting, Other (See Comments)   Makes the patient "feel funny" also        Medication List     TAKE these medications    Benadryl Allergy 25 MG capsule Generic drug: diphenhydrAMINE Take 25 mg by mouth every 6 (six) hours as needed for allergies.   cyclobenzaprine 10 MG tablet Commonly known as: FLEXERIL Take 1 tablet (10 mg total) by mouth at bedtime. TAKE 1 TABLET BY MOUTH DAILY AS NEEDED FOR MUSCLE SPASMS. What changed:  when to take this reasons to take this additional instructions   folic acid 1 MG tablet Commonly known as: FOLVITE Take 1 tablet (1 mg total) by mouth daily.   morphine 60 MG 12 hr tablet Commonly known as: MS CONTIN Take 1 tablet (60 mg total) by mouth every 12 (twelve) hours.   Oxycodone HCl 10 MG Tabs Take 1 tablet (10 mg total) by mouth every 6 (six) hours as needed (pain).   Vitamin D (Ergocalciferol) 1.25 MG (50000 UNIT) Caps capsule Commonly known as: DRISDOL TAKE ONE CAPSULE BY MOUTH ONE TIME PER WEEK What changed:  how much to take how to take this when to take this additional instructions        The results of significant diagnostics from this hospitalization (including imaging, microbiology, ancillary and laboratory) are listed below for reference.    Significant Diagnostic Studies: No results found.  Microbiology: No results found for this or any previous visit (from the past 240 hour(s)).   Labs: Basic Metabolic Panel: Recent Labs  Lab 10/01/23 1002  10/02/23 0551  NA 138 137  K 3.4* 3.5  CL 108 107  CO2 23 24  GLUCOSE 101* 111*  BUN 9 6  CREATININE 0.61 0.45  CALCIUM 8.9 8.6*   Liver Function Tests: Recent Labs  Lab 10/01/23 1002 10/02/23 0551  AST 29 34  ALT 24 25  ALKPHOS 56 55  BILITOT 0.9 1.2*  PROT 7.5 7.1  ALBUMIN 3.9 3.9   No results for input(s): "LIPASE", "AMYLASE" in the last 168 hours. No results for input(s): "AMMONIA" in the last 168 hours. CBC: Recent Labs  Lab 10/01/23 1002 10/02/23 0551  WBC 7.5 6.9  NEUTROABS 3.9 2.9  HGB 9.3* 9.1*  HCT 25.5* 26.2*  MCV 84.2 84.8  PLT 454* 418*   Cardiac Enzymes: No results for input(s): "CKTOTAL", "CKMB", "  CKMBINDEX", "TROPONINI" in the last 168 hours. BNP: Invalid input(s): "POCBNP" CBG: No results for input(s): "GLUCAP" in the last 168 hours.  Time coordinating discharge: 50 minutes  Signed:  Vung Kush  [facility] 10/03/2023, 11:44 AM

## 2023-10-03 NOTE — Plan of Care (Addendum)
Patient discharging home via private vehicle with family. Verbalizes understanding of education. Dilaudid PCA discontinued, no med left in syringe. Haydee Salter, RN 10/03/23 12:15 PM

## 2023-10-04 ENCOUNTER — Telehealth: Payer: Self-pay

## 2023-10-04 NOTE — Transitions of Care (Post Inpatient/ED Visit) (Signed)
   10/04/2023  Name: Carmen [name] MRN: [id number] DOB: October 06, 1991  Today's TOC FU Call Status: Today's TOC FU Call Status:: Unsuccessful Call (1st Attempt) Unsuccessful Call (1st Attempt) Date: 10/04/23  Attempted to reach the patient regarding the most recent Inpatient/ED visit.  Follow Up Plan: Additional outreach attempts will be made to reach the patient to complete the Transitions of Care (Post Inpatient/ED visit) call.   Osceola Holian Daphine Deutscher BSN, RN RN Care Manager   Transitions of Care [facility] - [facility] Health Direct Dial Number:  [phone]

## 2023-10-06 ENCOUNTER — Other Ambulatory Visit: Payer: Self-pay | Admitting: Family Medicine

## 2023-10-06 ENCOUNTER — Telehealth: Payer: Self-pay

## 2023-10-06 ENCOUNTER — Other Ambulatory Visit: Payer: Self-pay

## 2023-10-06 DIAGNOSIS — D571 Sickle-cell disease without crisis: Secondary | ICD-10-CM

## 2023-10-06 DIAGNOSIS — Z79891 Long term (current) use of opiate analgesic: Secondary | ICD-10-CM

## 2023-10-06 MED ORDER — OXYCODONE HCL 10 MG PO TABS: 10.0000 mg | ORAL_TABLET | Freq: Four times a day (QID) | ORAL | 0 refills | Status: DC | PRN

## 2023-10-06 NOTE — Transitions of Care (Post Inpatient/ED Visit) (Signed)
   10/06/2023  Name: Carmen [name] MRN: [id number] DOB: 1991/08/26  Today's TOC FU Call Status: Today's TOC FU Call Status:: Unsuccessful Call (2nd and 3rd Attempts) Unsuccessful Call (2nd and 3rd Attempts) Date: 10/06/23   Attempted to reach the patient regarding the most recent Inpatient/ED visit.  Follow Up Plan: No further outreach attempts will be made at this time. We have been unable to contact the patient.  Angline Schweigert Daphine Deutscher BSN, RN RN Care Manager   Transitions of Care [facility] - [facility] Health Direct Dial Number:  [phone]

## 2023-10-06 NOTE — Telephone Encounter (Signed)
Reviewed PDMP substance reporting system prior to prescribing opiate medications. No inconsistencies noted.  Meds ordered this encounter  Medications   Oxycodone HCl 10 MG TABS    Sig: Take 1 tablet (10 mg total) by mouth every 6 (six) hours as needed (pain).    Dispense:  60 tablet    Refill:  0    Order Specific Question:   Supervising Provider    Answer:   [name], [name] [[id number]]   Carmen [name] Orie Cuttino  APRN, MSN, FNP-C [facility] [address]  [place], [place] [address] [phone]  

## 2023-10-06 NOTE — Transitions of Care (Post Inpatient/ED Visit) (Signed)
   10/06/2023  Name: Carmen [name] MRN: [id number] DOB: [birth year]  Today's TOC FU Call Status: Today's TOC FU Call Status:: Unsuccessful Call (2nd Attempt) (Attempt # 2 - Req call back 1pm today.) Unsuccessful Call (2nd Attempt) Date: 10/06/23  Attempted to reach the patient regarding the most recent Inpatient/ED visit. Spoke with patient, not a convenient time for TOC call, agreed to re-schedule for 1 PM today, 10/06/2023.  Follow Up Plan: Additional outreach attempts will be made to reach the patient to complete the Transitions of Care (Post Inpatient/ED visit) call.   Nechuma Boven Daphine Deutscher BSN, RN RN Care Manager   Transitions of Care [facility] - [facility] Health Direct Dial Number:  [phone]

## 2023-10-11 ENCOUNTER — Other Ambulatory Visit: Payer: Self-pay | Admitting: Family Medicine

## 2023-10-11 DIAGNOSIS — E559 Vitamin D deficiency, unspecified: Secondary | ICD-10-CM

## 2023-10-29 ENCOUNTER — Inpatient Hospital Stay (HOSPITAL_COMMUNITY)
Admission: EM | Admit: 2023-10-29 | Discharge: 2023-11-01 | DRG: 812 | Disposition: A | Discharge status: Home / Self Care | Payer: MEDICAID | Attending: Internal Medicine | Admitting: Internal Medicine

## 2023-10-29 ENCOUNTER — Other Ambulatory Visit: Payer: Self-pay

## 2023-10-29 ENCOUNTER — Encounter (HOSPITAL_COMMUNITY): Payer: Self-pay

## 2023-10-29 DIAGNOSIS — Z79899 Other long term (current) drug therapy: Secondary | ICD-10-CM

## 2023-10-29 DIAGNOSIS — D638 Anemia in other chronic diseases classified elsewhere: Secondary | ICD-10-CM | POA: Diagnosis present

## 2023-10-29 DIAGNOSIS — D5700 Hb-SS disease with crisis, unspecified: Principal | ICD-10-CM | POA: Diagnosis present

## 2023-10-29 DIAGNOSIS — F17210 Nicotine dependence, cigarettes, uncomplicated: Secondary | ICD-10-CM | POA: Diagnosis present

## 2023-10-29 DIAGNOSIS — Z8261 Family history of arthritis: Secondary | ICD-10-CM | POA: Diagnosis not present

## 2023-10-29 DIAGNOSIS — Z9081 Acquired absence of spleen: Secondary | ICD-10-CM

## 2023-10-29 DIAGNOSIS — Z841 Family history of disorders of kidney and ureter: Secondary | ICD-10-CM

## 2023-10-29 DIAGNOSIS — G894 Chronic pain syndrome: Secondary | ICD-10-CM | POA: Diagnosis present

## 2023-10-29 DIAGNOSIS — F331 Major depressive disorder, recurrent, moderate: Secondary | ICD-10-CM | POA: Diagnosis present

## 2023-10-29 DIAGNOSIS — F1120 Opioid dependence, uncomplicated: Secondary | ICD-10-CM | POA: Diagnosis present

## 2023-10-29 DIAGNOSIS — Z79891 Long term (current) use of opiate analgesic: Secondary | ICD-10-CM | POA: Diagnosis not present

## 2023-10-29 DIAGNOSIS — Z720 Tobacco use: Secondary | ICD-10-CM | POA: Diagnosis present

## 2023-10-29 DIAGNOSIS — Z832 Family history of diseases of the blood and blood-forming organs and certain disorders involving the immune mechanism: Secondary | ICD-10-CM

## 2023-10-29 DIAGNOSIS — D571 Sickle-cell disease without crisis: Secondary | ICD-10-CM

## 2023-10-29 DIAGNOSIS — F419 Anxiety disorder, unspecified: Secondary | ICD-10-CM | POA: Diagnosis present

## 2023-10-29 DIAGNOSIS — Z8249 Family history of ischemic heart disease and other diseases of the circulatory system: Secondary | ICD-10-CM

## 2023-10-29 DIAGNOSIS — F17200 Nicotine dependence, unspecified, uncomplicated: Secondary | ICD-10-CM | POA: Diagnosis present

## 2023-10-29 DIAGNOSIS — F322 Major depressive disorder, single episode, severe without psychotic features: Secondary | ICD-10-CM | POA: Diagnosis present

## 2023-10-29 LAB — CBC WITH DIFFERENTIAL/PLATELET
Abs Immature Granulocytes: 0.02 10*3/uL (ref 0.00–0.07)
Basophils Absolute: 0 10*3/uL (ref 0.0–0.1)
Basophils Relative: 0 %
Eosinophils Absolute: 0 10*3/uL (ref 0.0–0.5)
Eosinophils Relative: 1 %
HCT: 29 % — ABNORMAL LOW (ref 36.0–46.0)
Hemoglobin: 10.3 g/dL — ABNORMAL LOW (ref 12.0–15.0)
Immature Granulocytes: 0 %
Lymphocytes Relative: 40 %
Lymphs Abs: 3 10*3/uL (ref 0.7–4.0)
MCH: 30.1 pg (ref 26.0–34.0)
MCHC: 35.5 g/dL (ref 30.0–36.0)
MCV: 84.8 fL (ref 80.0–100.0)
Monocytes Absolute: 0.7 10*3/uL (ref 0.1–1.0)
Monocytes Relative: 9 %
Neutro Abs: 3.9 10*3/uL (ref 1.7–7.7)
Neutrophils Relative %: 50 %
Platelets: 450 10*3/uL — ABNORMAL HIGH (ref 150–400)
RBC: 3.42 MIL/uL — ABNORMAL LOW (ref 3.87–5.11)
RDW: 14.6 % (ref 11.5–15.5)
WBC: 7.6 10*3/uL (ref 4.0–10.5)
nRBC: 0.3 % — ABNORMAL HIGH (ref 0.0–0.2)

## 2023-10-29 LAB — COMPREHENSIVE METABOLIC PANEL
ALT: 18 U/L (ref 0–44)
AST: 36 U/L (ref 15–41)
Albumin: 4.1 g/dL (ref 3.5–5.0)
Alkaline Phosphatase: 55 U/L (ref 38–126)
Anion gap: 9 (ref 5–15)
BUN: 9 mg/dL (ref 6–20)
CO2: 19 mmol/L — ABNORMAL LOW (ref 22–32)
Calcium: 9 mg/dL (ref 8.9–10.3)
Chloride: 108 mmol/L (ref 98–111)
Creatinine, Ser: 0.5 mg/dL (ref 0.44–1.00)
GFR, Estimated: >60 mL/min (ref >60)
Glucose, Bld: 98 mg/dL (ref 70–99)
Potassium: 4.6 mmol/L (ref 3.5–5.1)
Sodium: 136 mmol/L (ref 135–145)
Total Bilirubin: 1.2 mg/dL — ABNORMAL HIGH (ref <1.2)
Total Protein: 7.8 g/dL (ref 6.5–8.1)

## 2023-10-29 LAB — URINALYSIS, ROUTINE W REFLEX MICROSCOPIC
Bilirubin Urine: NEGATIVE
Glucose, UA: NEGATIVE mg/dL
Hgb urine dipstick: NEGATIVE
Ketones, ur: NEGATIVE mg/dL
Leukocytes,Ua: NEGATIVE
Nitrite: NEGATIVE
Protein, ur: NEGATIVE mg/dL
Specific Gravity, Urine: 1.013 (ref 1.005–1.030)
pH: 5 (ref 5.0–8.0)

## 2023-10-29 LAB — RETICULOCYTES
Immature Retic Fract: 31.1 % — ABNORMAL HIGH (ref 2.3–15.9)
RBC.: 3.54 MIL/uL — ABNORMAL LOW (ref 3.87–5.11)
Retic Count, Absolute: 160 10*3/uL (ref 19.0–186.0)
Retic Ct Pct: 4.5 % — ABNORMAL HIGH (ref 0.4–3.1)

## 2023-10-29 LAB — HCG, SERUM, QUALITATIVE: Preg, Serum: NEGATIVE

## 2023-10-29 MED ORDER — FOLIC ACID 1 MG PO TABS
1.0000 mg | ORAL_TABLET | Freq: Every day | ORAL | Status: DC
Start: 2023-10-30 — End: 2023-11-01
  Administered 2023-10-30 – 2023-11-01 (×3): 1 mg via ORAL
  Filled 2023-10-29 (×3): qty 1

## 2023-10-29 MED ORDER — HYDROMORPHONE 1 MG/ML IV SOLN
INTRAVENOUS | Status: DC
  Administered 2023-10-29: 6.5 mg via INTRAVENOUS
  Administered 2023-10-29: 4 mg via INTRAVENOUS
  Administered 2023-10-29: 30 mg via INTRAVENOUS
  Administered 2023-10-30: 3 mg via INTRAVENOUS
  Administered 2023-10-30: 2.5 mg via INTRAVENOUS
  Administered 2023-10-30: 30 mg via INTRAVENOUS
  Administered 2023-10-30: 5.5 mg via INTRAVENOUS
  Administered 2023-10-30: 4.5 mg via INTRAVENOUS
  Administered 2023-10-31: 6.5 mg via INTRAVENOUS
  Administered 2023-10-31: 30 mg via INTRAVENOUS
  Administered 2023-10-31: 6 mg via INTRAVENOUS
  Administered 2023-10-31: 2.93 mg via INTRAVENOUS
  Administered 2023-10-31: 5 mg via INTRAVENOUS
  Administered 2023-10-31: 5.5 mg via INTRAVENOUS
  Administered 2023-10-31: 7 mg via INTRAVENOUS
  Administered 2023-11-01: 2.5 mg via INTRAVENOUS
  Administered 2023-11-01: 1 mg via INTRAVENOUS
  Administered 2023-11-01: 2.5 mg via INTRAVENOUS
  Filled 2023-10-29 (×4): qty 30

## 2023-10-29 MED ORDER — OXYCODONE HCL 5 MG PO TABS: 10.0000 mg | ORAL_TABLET | Freq: Four times a day (QID) | ORAL | Status: DC | PRN

## 2023-10-29 MED ORDER — HYDROMORPHONE HCL 2 MG/ML IJ SOLN
2.0000 mg | Freq: Once | INTRAMUSCULAR | Status: AC
  Administered 2023-10-29: 2 mg via INTRAVENOUS

## 2023-10-29 MED ORDER — KETOROLAC TROMETHAMINE 15 MG/ML IJ SOLN
15.0000 mg | Freq: Four times a day (QID) | INTRAMUSCULAR | Status: DC
  Administered 2023-10-29 – 2023-11-01 (×11): 15 mg via INTRAVENOUS
  Filled 2023-10-29 (×11): qty 1

## 2023-10-29 MED ORDER — POLYETHYLENE GLYCOL 3350 17 G PO PACK: 17.0000 g | PACK | Freq: Every day | ORAL | Status: DC | PRN

## 2023-10-29 MED ORDER — KETOROLAC TROMETHAMINE 15 MG/ML IJ SOLN
15.0000 mg | Freq: Once | INTRAMUSCULAR | Status: AC
  Administered 2023-10-29: 15 mg via INTRAVENOUS
  Filled 2023-10-29: qty 1

## 2023-10-29 MED ORDER — ONDANSETRON HCL 4 MG/2ML IJ SOLN
4.0000 mg | Freq: Once | INTRAMUSCULAR | Status: AC
  Administered 2023-10-29: 4 mg via INTRAVENOUS
  Filled 2023-10-29: qty 2

## 2023-10-29 MED ORDER — SENNOSIDES-DOCUSATE SODIUM 8.6-50 MG PO TABS
1.0000 | ORAL_TABLET | Freq: Two times a day (BID) | ORAL | Status: DC
  Administered 2023-10-29 – 2023-11-01 (×6): 1 via ORAL
  Filled 2023-10-29 (×6): qty 1

## 2023-10-29 MED ORDER — DIPHENHYDRAMINE HCL 25 MG PO CAPS
25.0000 mg | ORAL_CAPSULE | Freq: Once | ORAL | Status: AC
  Administered 2023-10-29: 25 mg via ORAL
  Filled 2023-10-29: qty 1

## 2023-10-29 MED ORDER — ONDANSETRON HCL 4 MG/2ML IJ SOLN
4.0000 mg | Freq: Four times a day (QID) | INTRAMUSCULAR | Status: DC | PRN
  Administered 2023-10-30 – 2023-10-31 (×2): 4 mg via INTRAVENOUS
  Filled 2023-10-29 (×2): qty 2

## 2023-10-29 MED ORDER — DIPHENHYDRAMINE HCL 25 MG PO CAPS
25.0000 mg | ORAL_CAPSULE | ORAL | Status: DC | PRN
Start: 2023-10-29 — End: 2023-11-01

## 2023-10-29 MED ORDER — NALOXONE HCL 0.4 MG/ML IJ SOLN: 0.4000 mg | INTRAMUSCULAR | Status: DC | PRN

## 2023-10-29 MED ORDER — HYDROMORPHONE HCL 2 MG/ML IJ SOLN
2.0000 mg | Freq: Once | INTRAMUSCULAR | Status: AC
  Administered 2023-10-29: 2 mg via INTRAVENOUS
  Filled 2023-10-29: qty 1

## 2023-10-29 MED ORDER — SODIUM CHLORIDE 0.9% FLUSH: 9.0000 mL | INTRAVENOUS | Status: DC | PRN

## 2023-10-29 MED ORDER — SODIUM CHLORIDE 0.45 % IV SOLN
INTRAVENOUS | Status: AC
Start: 2023-10-29 — End: 2023-10-30

## 2023-10-29 MED ORDER — CYCLOBENZAPRINE HCL 10 MG PO TABS: 10.0000 mg | ORAL_TABLET | Freq: Every day | ORAL | Status: DC | PRN

## 2023-10-29 MED ORDER — ENOXAPARIN SODIUM 40 MG/0.4ML IJ SOSY
40.0000 mg | PREFILLED_SYRINGE | Freq: Every day | INTRAMUSCULAR | Status: DC
  Filled 2023-10-29: qty 0.4

## 2023-10-29 MED ORDER — MORPHINE SULFATE ER 15 MG PO TBCR
60.0000 mg | EXTENDED_RELEASE_TABLET | Freq: Two times a day (BID) | ORAL | Status: DC
  Administered 2023-10-29 – 2023-11-01 (×6): 60 mg via ORAL
  Filled 2023-10-29 (×7): qty 4

## 2023-10-29 MED ORDER — HYDROMORPHONE HCL 2 MG/ML IJ SOLN
2.0000 mg | INTRAMUSCULAR | Status: DC | PRN
  Filled 2023-10-29: qty 1

## 2023-10-29 NOTE — ED Triage Notes (Signed)
Pt presenting today complaining of sickle cell crisis that has been ongoing since Monday. Pt endorsing pain in lower back and hip area. Pt has tried her at home pain medication without relief. Last took oxy at 0230.

## 2023-10-29 NOTE — ED Provider Notes (Signed)
 [place] EMERGENCY DEPARTMENT AT [facility] Provider Note   CSN: [id number] Arrival date & time: 10/29/23  4403     History Chief Complaint  Patient presents with   Sickle Cell Pain Crisis    Carmen [name] is a 32 y.o. female.  Patient with past history significant for sickle cell anemia presents the emergency department with concerns of hip pain.  She reports that she has tried taking her home medications and feels that she is currently in a sickle cell pain crisis.  Denies any chest pain, shortness of breath, leg swelling, hemoptysis, or any other focal abnormalities.  No recent illness as far as patient is aware.  Reports trying to take oxycodone and morphine at home without improvement in symptoms.  The pain is somewhat typical for her when she has sickle cell type pain.   Sickle Cell Pain Crisis      Home Medications Prior to Admission medications   Medication Sig Start Date End Date Taking? Authorizing Provider  BENADRYL ALLERGY 25 MG capsule Take 25 mg by mouth every 6 (six) hours as needed for allergies.   Yes [provider]  cyclobenzaprine (FLEXERIL) 10 MG tablet Take 1 tablet (10 mg total) by mouth at bedtime. TAKE 1 TABLET BY MOUTH DAILY AS NEEDED FOR MUSCLE SPASMS. Patient taking differently: Take 10 mg by mouth daily as needed for muscle spasms. 06/14/23  Yes Massie Maroon, FNP  folic acid (FOLVITE) 1 MG tablet Take 1 tablet (1 mg total) by mouth daily. 03/15/19  Yes Mike Gip, FNP  morphine (MS CONTIN) 60 MG 12 hr tablet Take 1 tablet (60 mg total) by mouth every 12 (twelve) hours. 10/03/23 11/02/23 Yes Quentin Angst, MD  Oxycodone HCl 10 MG TABS Take 1 tablet (10 mg total) by mouth every 6 (six) hours as needed (pain). 10/09/23  Yes Massie Maroon, FNP  Vitamin D, Ergocalciferol, (DRISDOL) 1.25 MG (50000 UNIT) CAPS capsule TAKE 1 CAPSULE BY MOUTH ONE TIME PER WEEK 10/11/23  Yes Massie Maroon, FNP      Allergies    Ketamine     Review of Systems   Review of Systems  Musculoskeletal:        Hip pain  All other systems reviewed and are negative.   Physical Exam Updated Vital Signs BP 106/73 (BP Location: Left Arm)   Pulse 66   Temp 98.5 F (36.9 C) (Oral)   Resp 13   Ht 5\' 3"  (1.6 m)   Wt 81.6 kg   SpO2 98%   BMI 31.89 kg/m  Physical Exam Vitals and nursing note reviewed.  Constitutional:      General: She is not in acute distress.    Appearance: She is well-developed.  HENT:     Head: Normocephalic and atraumatic.  Eyes:     Conjunctiva/sclera: Conjunctivae normal.  Cardiovascular:     Rate and Rhythm: Normal rate and regular rhythm.     Heart sounds: No murmur heard. Pulmonary:     Effort: Pulmonary effort is normal. No respiratory distress.     Breath sounds: Normal breath sounds.  Abdominal:     Palpations: Abdomen is soft.     Tenderness: There is no abdominal tenderness.  Musculoskeletal:        General: No swelling.     Cervical back: Neck supple.  Skin:    General: Skin is warm and dry.     Capillary Refill: Capillary refill takes less than 2 seconds.  Neurological:     Mental Status: She is alert.  Psychiatric:        Mood and Affect: Mood normal.     ED Results / Procedures / Treatments   Labs (all labs ordered are listed, but only abnormal results are displayed) Labs Reviewed  CBC WITH DIFFERENTIAL/PLATELET - Abnormal; Notable for the following components:      Result Value   RBC 3.42 (*)    Hemoglobin 10.3 (*)    HCT 29.0 (*)    Platelets 450 (*)    nRBC 0.3 (*)    All other components within normal limits  COMPREHENSIVE METABOLIC PANEL - Abnormal; Notable for the following components:   CO2 19 (*)    Total Bilirubin 1.2 (*)    All other components within normal limits  RETICULOCYTES - Abnormal; Notable for the following components:   Retic Ct Pct 4.5 (*)    RBC. 3.54 (*)    Immature Retic Fract 31.1 (*)    All other components within normal limits  CBC -  Abnormal; Notable for the following components:   WBC 10.6 (*)    RBC 2.80 (*)    Hemoglobin 8.5 (*)    HCT 23.1 (*)    MCHC 36.8 (*)    Platelets 434 (*)    nRBC 0.3 (*)    All other components within normal limits  HCG, SERUM, QUALITATIVE  URINALYSIS, ROUTINE W REFLEX MICROSCOPIC  CBC    EKG None  Radiology No results found.  Procedures Procedures   Medications Ordered in ED Medications  morphine (MS CONTIN) 12 hr tablet 60 mg (60 mg Oral Given 10/30/23 2138)  oxyCODONE (Oxy IR/ROXICODONE) immediate release tablet 10 mg (has no administration in time range)  folic acid (FOLVITE) tablet 1 mg (1 mg Oral Given 10/30/23 0949)  cyclobenzaprine (FLEXERIL) tablet 10 mg (has no administration in time range)  senna-docusate (Senokot-S) tablet 1 tablet (1 tablet Oral Given 10/30/23 2138)  polyethylene glycol (MIRALAX / GLYCOLAX) packet 17 g (has no administration in time range)  naloxone [facility]) injection 0.4 mg (has no administration in time range)    And  sodium chloride flush (NS) 0.9 % injection 9 mL (has no administration in time range)  ondansetron (ZOFRAN) injection 4 mg (4 mg Intravenous Given 10/30/23 2135)  diphenhydrAMINE (BENADRYL) capsule 25 mg (has no administration in time range)  enoxaparin (LOVENOX) injection 40 mg (40 mg Subcutaneous Not Given 10/30/23 2138)  ketorolac (TORADOL) 15 MG/ML injection 15 mg (15 mg Intravenous Given 10/31/23 0525)  0.45 % sodium chloride infusion ( Intravenous Rate/Dose Change 10/30/23 1111)  HYDROmorphone (DILAUDID) 1 mg/mL PCA injection (6.5 mg Intravenous Received 10/31/23 0358)  ketorolac (TORADOL) 15 MG/ML injection 15 mg (15 mg Intravenous Given 10/29/23 1152)  HYDROmorphone (DILAUDID) injection 2 mg (2 mg Intravenous Given 10/29/23 1153)  diphenhydrAMINE (BENADRYL) capsule 25 mg (25 mg Oral Given 10/29/23 1153)  ondansetron (ZOFRAN) injection 4 mg (4 mg Intravenous Given 10/29/23 1152)  HYDROmorphone (DILAUDID) injection 2 mg  (2 mg Intravenous Given 10/29/23 1330)  ketorolac (TORADOL) 15 MG/ML injection 15 mg (15 mg Intravenous Given 10/29/23 1435)  HYDROmorphone (DILAUDID) injection 2 mg (2 mg Intravenous Given 10/29/23 1435)    ED Course/ Medical Decision Making/ A&P                                 Medical Decision Making Amount and/or Complexity of Data Reviewed Labs: ordered.  Risk Prescription drug management. Decision regarding hospitalization.   This patient presents to the ED for concern of sickle cell pain crisis.  Differential diagnosis includes sickle cell anemia, chronic pain, pyelonephritis, urolithiasis   Lab Tests:  I Ordered, and personally interpreted labs.  The pertinent results include: CBC with baseline anemia seen, CMP unremarkable, reticulocyte count at baseline, hCG negative   Medicines ordered and prescription drug management:  I ordered medication including Dilaudid, toradol, benadryl, Zofran for pain, nausea  Reevaluation of the patient after these medicines showed that the patient improved I have reviewed the patients home medicines and have made adjustments as needed   Problem List / ED Course:  Patient with history of sickle cell anemia presents to the ED with concerns of sickle cell pain crisis. States that she has been trying to take oxycodone and morphine at home for the last week without improvement in symptoms. Feels that pain initially flared up about 1 week ago with no improvement with home meds. Endorses pain primarily to bilateral hips and low back. Denies urinary symptoms. Basic labs ordered for [facility] evaluation. No significant derangements are seen on labs. Hemoglobin up-trending compared to three weeks ago. CMP unremarkable. Doses of Dilaudid, toradol, benadryl, and Zofran for treatment of symptoms. Suspect SCC. After initial dose of pain medicine, slight improvement. Will redose Dilaudid and reassess. After 2nd and 3rd doses of Dilaudid, states pain somewhat  improved but still endorsing pain in hips. States she does not feel that she has improved enough to be able to discharge home. Appears that patient has been admitted several times in the last few months for SCC. Suspect this is developing into poorly controlled chronic pain. Will discuss care with hospitalist. Spoke with Dr. Avie Arenas, hospitalist, regarding patient. Will admit for continued pain control.   Final Clinical Impression(s) / ED Diagnoses Final diagnoses:  Sickle cell pain crisis [facility])    Rx / DC Orders ED Discharge Orders     None         Smitty Knudsen, PA-C 10/31/23 0725    Maia Plan, MD 11/09/23 1052

## 2023-10-29 NOTE — H&P (Signed)
H&P  Patient Demographics:  Carmen [name], is a 32 y.o. female  MRN: [id number]   DOB - [birth year]  Admit Date - 10/29/2023  Outpatient Primary MD for the patient is Massie Maroon, FNP  Chief Complaint  Patient presents with   Sickle Cell Pain Crisis      HPI:   Carmen [name]  is a 32 y.o. female with medical history significant for sickle cell disease, chronic pain syndrome, opiate dependence and tolerance, anemia of chronic disease and major depressive disorder, who presented to the emergency room today with major complaints of generalized body pain most especially in her bilateral hip joints that is consistent with her typical sickle cell pain crises.  She has taken her home pain medications with no sustained relief.  She says she got a little bit of relief in the emergency room after many doses of IV Dilaudid but her hip pain would not just go down.  She is rating her pain at about 8/10 this evening.  She does not remember any inciting factor.  She denies any fever, cough, chest pain, shortness of breath, nausea, vomiting or diarrhea.  No urinary symptoms.  She denies any recent travels, sick contacts or known exposure to COVID-19.  ED course: Her vitals showed: BP (!) 109/56 (BP Location: Left Arm)  Pulse 67  Temp 98.5 F (36.9 C) (Oral)  Resp 16  Ht 5\' 3"  (1.6 m)  Wt 81.6 kg  SpO2 100%  BMI 31.89 kg/m.  She was conscious and alert, in no acute distress.  Her hemoglobin is at baseline of 10.3, normal white cell count, slightly elevated platelet count, but this is chronic.  Comprehensive metabolic panel is largely unremarkable.  Patient received multiple doses of IV Dilaudid and other pain regimen in the emergency room with no significant relief of her pain.  Patient will be admitted to the hospital for further evaluation and management of sickle cell pain crisis.   Review of systems:  In addition to the HPI above, patient reports No fever or chills No Headache, No changes with  vision or hearing No problems swallowing food or liquids No chest pain, cough or shortness of breath No abdominal pain, No nausea or vomiting, Bowel movements are regular No blood in stool or urine No dysuria No new skin rashes or bruises No new joints pains-aches No new weakness, tingling, numbness in any extremity No recent weight gain or loss No polyuria, polydypsia or polyphagia No significant Mental Stressors  A full 10 point Review of Systems was done, except as stated above, all other Review of Systems were negative.  With Past History of the following :   Past Medical History:  Diagnosis Date   Anxiety    Headache(784.0)    Heart murmur    Sickle cell crisis (HCC)    Syphilis 2015   Was diagnosed and received one injection of antibiotics   Thrombocytosis 11/22/2014     CBC Latest Ref Rng & Units 12/13/2018 12/11/2018 12/10/2018 WBC 4.0 - 10.5 K/uL 14.8(H) 13.2(H) 15.9(H) Hemoglobin 12.0 - 15.0 g/dL 8.2(L) 7.7(L) 8.1(L) Hematocrit 36.0 - 46.0 % 23.7(L) 23.2(L) 24.5(L) Platelets 150 - 400 K/uL 326 399 388        Past Surgical History:  Procedure Laterality Date   CESAREAN SECTION N/A 02/05/2019   Procedure: CESAREAN SECTION;  Surgeon: Hermina Staggers, MD;  Location: [facility];  Service: Obstetrics;  Laterality: N/A;   CHOLECYSTECTOMY N/A 11/30/2014   Procedure: LAPAROSCOPIC CHOLECYSTECTOMY SINGLE SITE  WITH INTRAOPERATIVE CHOLANGIOGRAM;  Surgeon: Karie Soda, MD;  Location: [facility];  Service: General;  Laterality: N/A;   SPLENECTOMY       Social History:   Social History   Tobacco Use   Smoking status: Some Days    Types: Cigarettes   Smokeless tobacco: Never  Substance Use Topics   Alcohol use: No     Lives - At home   Family History :   Family History  Problem Relation Age of Onset   Hypertension Mother    Sickle cell anemia Sister    Kidney disease Sister        Lupus   Arthritis Sister    Sickle cell anemia Sister    Sickle cell trait Sister    Heart  disease Maternal Aunt        CABG   Heart disease Maternal Uncle        CABG   Lupus Sister      Home Medications:   Prior to Admission medications   Medication Sig Start Date End Date Taking? Authorizing Provider  BENADRYL ALLERGY 25 MG capsule Take 25 mg by mouth every 6 (six) hours as needed for allergies.   Yes [provider]  cyclobenzaprine (FLEXERIL) 10 MG tablet Take 1 tablet (10 mg total) by mouth at bedtime. TAKE 1 TABLET BY MOUTH DAILY AS NEEDED FOR MUSCLE SPASMS. Patient taking differently: Take 10 mg by mouth daily as needed for muscle spasms. 06/14/23  Yes Massie Maroon, FNP  folic acid (FOLVITE) 1 MG tablet Take 1 tablet (1 mg total) by mouth daily. 03/15/19  Yes Mike Gip, FNP  morphine (MS CONTIN) 60 MG 12 hr tablet Take 1 tablet (60 mg total) by mouth every 12 (twelve) hours. 10/03/23 11/02/23 Yes Quentin Angst, MD  Oxycodone HCl 10 MG TABS Take 1 tablet (10 mg total) by mouth every 6 (six) hours as needed (pain). 10/09/23  Yes Massie Maroon, FNP  Vitamin D, Ergocalciferol, (DRISDOL) 1.25 MG (50000 UNIT) CAPS capsule TAKE 1 CAPSULE BY MOUTH ONE TIME PER WEEK 10/11/23  Yes Massie Maroon, FNP     Allergies:   Allergies  Allergen Reactions   Ketamine Hives, Nausea And Vomiting and Other (See Comments)    Makes the patient "feel funny" also     Physical Exam:   Vitals:   Vitals:   10/29/23 1330 10/29/23 1559  BP: (!) 109/56   Pulse: 67   Resp: 16   Temp:  98.5 F (36.9 C)  SpO2: 100%     Physical Exam: Constitutional: Patient appears well-developed and well-nourished. Not in obvious distress. HENT: Normocephalic, atraumatic, External right and left ear normal. Oropharynx is clear and moist.  Eyes: Conjunctivae and EOM are normal. PERRLA, no scleral icterus. Neck: Normal ROM. Neck supple. No JVD. No tracheal deviation. No thyromegaly. CVS: RRR, S1/S2 +, no murmurs, no gallops, no carotid bruit.  Pulmonary: Effort and breath  sounds normal, no stridor, rhonchi, wheezes, rales.  Abdominal: Soft. BS +, no distension, tenderness, rebound or guarding.  Musculoskeletal: Normal range of motion. No edema and no tenderness.  Lymphadenopathy: No lymphadenopathy noted, cervical, inguinal or axillary Neuro: Alert. Normal reflexes, muscle tone coordination. No cranial nerve deficit. Skin: Skin is warm and dry. No rash noted. Not diaphoretic. No erythema. No pallor. Psychiatric: Normal mood and affect. Behavior, judgment, thought content normal.   Data Review:   CBC Recent Labs  Lab 10/29/23 1136  WBC 7.6  HGB 10.3*  HCT 29.0*  PLT 450*  MCV 84.8  MCH 30.1  MCHC 35.5  RDW 14.6  LYMPHSABS 3.0  MONOABS 0.7  EOSABS 0.0  BASOSABS 0.0   ------------------------------------------------------------------------------------------------------------------  Chemistries  Recent Labs  Lab 10/29/23 1136  NA 136  K 4.6  CL 108  CO2 19*  GLUCOSE 98  BUN 9  CREATININE 0.50  CALCIUM 9.0  AST 36  ALT 18  ALKPHOS 55  BILITOT 1.2*   ------------------------------------------------------------------------------------------------------------------ estimated creatinine clearance is 104.1 mL/min (by C-G formula based on SCr of 0.5 mg/dL). ------------------------------------------------------------------------------------------------------------------ No results for input(s): "TSH", "T4TOTAL", "T3FREE", "THYROIDAB" in the last 72 hours.  Invalid input(s): "FREET3"  Coagulation profile No results for input(s): "INR", "PROTIME" in the last 168 hours. ------------------------------------------------------------------------------------------------------------------- No results for input(s): "DDIMER" in the last 72 hours. -------------------------------------------------------------------------------------------------------------------  Cardiac Enzymes No results for input(s): "CKMB", "TROPONINI", "MYOGLOBIN" in the last  168 hours.  Invalid input(s): "CK" ------------------------------------------------------------------------------------------------------------------ No results found for: "BNP"  ---------------------------------------------------------------------------------------------------------------  Urinalysis    Component Value Date/Time   COLORURINE YELLOW 08/01/2023 0929   APPEARANCEUR HAZY (A) 08/01/2023 0929   LABSPEC 1.011 08/01/2023 0929   PHURINE 6.0 08/01/2023 0929   GLUCOSEU NEGATIVE 08/01/2023 0929   HGBUR NEGATIVE 08/01/2023 0929   BILIRUBINUR NEGATIVE 08/01/2023 0929   BILIRUBINUR negative 08/26/2021 1527   BILIRUBINUR Negative 08/10/2019 1534   KETONESUR 80 (A) 08/01/2023 0929   PROTEINUR NEGATIVE 08/01/2023 0929   UROBILINOGEN 1.0 08/26/2021 1527   UROBILINOGEN 1.0 01/12/2018 0943   NITRITE NEGATIVE 08/01/2023 0929   LEUKOCYTESUR NEGATIVE 08/01/2023 0929    ----------------------------------------------------------------------------------------------------------------   Imaging Results:    No results found.   Assessment & Plan:  Principal Problem:   Sickle cell anemia with crisis (HCC) Active Problems:   Anemia of chronic disease   Tobacco abuse   Moderate episode of recurrent major depressive disorder (HCC)   Chronic pain syndrome  Hb Sickle Cell Disease with crisis: Admit patient, start IVF 0.45% Saline @ 100 mls/hour, start weight based Dilaudid PCA, start IV Toradol 15 mg Q 6 H, Restart oral home pain medications, Monitor vitals very closely, Re-evaluate pain scale regularly, 2 L of Oxygen by , Patient will be re-evaluated for pain in the context of function and relationship to baseline as care progresses. Anemia of Chronic Disease: Hemoglobin is stable at baseline today.  There is no clinical indication for blood transfusion at this time.  Will monitor very closely and transfuse as appropriate. Chronic pain Syndrome: Restart and continue oral home pain  medications. Major depressive disorder: Clinically stable.  Patient denies any suicidal ideations or thoughts.  Will continue her home medications. Tobacco use disorder: Genine was counseled on the dangers of tobacco use, and was advised to quit. Reviewed strategies to maximize success, including removing cigarettes and smoking materials from environment, stress management and support of family/friends.   DVT Prophylaxis: Subcut Lovenox   AM Labs Ordered, also please review Full Orders  Family Communication: Admission, patient's condition and plan of care including tests being ordered have been discussed with the patient who indicate understanding and agree with the plan and Code Status.  Code Status: Full Code  Consults called: None    Admission status: Inpatient    Time spent in minutes : 50 minutes  Jeanann Lewandowsky MD, MHA, CPE, FACP 10/29/2023 at 4:13 PM

## 2023-10-29 NOTE — Plan of Care (Signed)
  Problem: Education: Goal: Knowledge of General Education information will improve Description: Including pain rating scale, medication(s)/side effects and non-pharmacologic comfort measures Outcome: Progressing   Problem: Health Behavior/Discharge Planning: Goal: Ability to manage health-related needs will improve Outcome: Progressing   Problem: Clinical Measurements: Goal: Ability to maintain clinical measurements within normal limits will improve Outcome: Progressing Goal: Will remain free from infection Outcome: Progressing Goal: Diagnostic test results will improve Outcome: Progressing Goal: Respiratory complications will improve Outcome: Progressing Goal: Cardiovascular complication will be avoided Outcome: Progressing   Problem: Activity: Goal: Risk for activity intolerance will decrease Outcome: Progressing   Problem: Nutrition: Goal: Adequate nutrition will be maintained Outcome: Progressing   Problem: Coping: Goal: Level of anxiety will decrease Outcome: Progressing   Problem: Pain Management: Goal: General experience of comfort will improve Outcome: Progressing   Problem: Safety: Goal: Ability to remain free from injury will improve Outcome: Progressing   Problem: Skin Integrity: Goal: Risk for impaired skin integrity will decrease Outcome: Progressing   Problem: Self-Care: Goal: Ability to incorporate actions that prevent/reduce pain crisis will improve Outcome: Progressing   Problem: Sensory: Goal: Pain level will decrease with appropriate interventions Outcome: Progressing

## 2023-10-30 DIAGNOSIS — D5700 Hb-SS disease with crisis, unspecified: Secondary | ICD-10-CM | POA: Diagnosis not present

## 2023-10-30 LAB — CBC
HCT: 23.1 % — ABNORMAL LOW (ref 36.0–46.0)
Hemoglobin: 8.5 g/dL — ABNORMAL LOW (ref 12.0–15.0)
MCH: 30.4 pg (ref 26.0–34.0)
MCHC: 36.8 g/dL — ABNORMAL HIGH (ref 30.0–36.0)
MCV: 82.5 fL (ref 80.0–100.0)
Platelets: 434 10*3/uL — ABNORMAL HIGH (ref 150–400)
RBC: 2.8 MIL/uL — ABNORMAL LOW (ref 3.87–5.11)
RDW: 14.2 % (ref 11.5–15.5)
WBC: 10.6 10*3/uL — ABNORMAL HIGH (ref 4.0–10.5)
nRBC: 0.3 % — ABNORMAL HIGH (ref 0.0–0.2)

## 2023-10-30 NOTE — Plan of Care (Signed)
°  Problem: Education: Goal: Knowledge of General Education information will improve Description: Including pain rating scale, medication(s)/side effects and non-pharmacologic comfort measures Outcome: Progressing   Problem: Education: Goal: Knowledge of vaso-occlusive preventative measures will improve Outcome: Progressing Goal: Awareness of infection prevention will improve Outcome: Progressing Goal: Awareness of signs and symptoms of anemia will improve Outcome: Progressing Goal: Long-term complications will improve Outcome: Progressing

## 2023-10-30 NOTE — Plan of Care (Signed)

## 2023-10-30 NOTE — Progress Notes (Signed)
Patient ID: Carmen [name], female   DOB: [birth year], 32 y.o.   MRN: [id number] Subjective: Carmen [name] is a 32 year old female with a medical history significant for sickle cell disease, chronic pain syndrome, opiate dependence and tolerance, major depression, and anemia of chronic disease was admitted for sickle cell pain crisis.   Patient says she is feeling slightly better this morning.  Pain is still significant in her lower back and hip joints, about 7/10.  She however denies any fever, cough, chest pain, shortness of breath, nausea, vomiting or diarrhea.  No urinary symptoms.  Objective:  Vital signs in last 24 hours:  Vitals:   10/30/23 0505 10/30/23 0724 10/30/23 0950 10/30/23 1111  BP: (!) 111/56  112/70   Pulse: 61  68   Resp: 16 17 20 14   Temp: 98.3 F (36.8 C)  98.6 F (37 C)   TempSrc: Oral  Oral   SpO2: 97% 100% 97% 100%  Weight:      Height:        Intake/Output from previous day:   Intake/Output Summary (Last 24 hours) at 10/30/2023 1301 Last data filed at 10/30/2023 0600 Gross per 24 hour  Intake 1514.86 ml  Output 0 ml  Net 1514.86 ml    Physical Exam: General: Alert, awake, oriented x3, in no acute distress.  HEENT: [place]/AT PEERL, EOMI Neck: Trachea midline,  no masses, no thyromegal,y no JVD, no carotid bruit OROPHARYNX:  Moist, No exudate/ erythema/lesions.  Heart: Regular rate and rhythm, without murmurs, rubs, gallops, PMI non-displaced, no heaves or thrills on palpation.  Lungs: Clear to auscultation, no wheezing or rhonchi noted. No increased vocal fremitus resonant to percussion  Abdomen: Soft, nontender, nondistended, positive bowel sounds, no masses no hepatosplenomegaly noted..  Neuro: No focal neurological deficits noted cranial nerves II through XII grossly intact. DTRs 2+ bilaterally upper and lower extremities. Strength 5 out of 5 in bilateral upper and lower extremities. Musculoskeletal: No warm swelling or erythema around joints, no spinal  tenderness noted. Psychiatric: Patient alert and oriented x3, good insight and cognition, good recent to remote recall. Lymph node survey: No cervical axillary or inguinal lymphadenopathy noted.  Lab Results:  Basic Metabolic Panel:    Component Value Date/Time   NA 136 10/29/2023 1136   NA 140 09/20/2023 1211   K 4.6 10/29/2023 1136   CL 108 10/29/2023 1136   CO2 19 (L) 10/29/2023 1136   BUN 9 10/29/2023 1136   BUN 11 09/20/2023 1211   CREATININE 0.50 10/29/2023 1136   GLUCOSE 98 10/29/2023 1136   CALCIUM 9.0 10/29/2023 1136   CBC:    Component Value Date/Time   WBC 10.6 (H) 10/30/2023 0554   HGB 8.5 (L) 10/30/2023 0554   HGB 9.7 (L) 09/20/2023 1211   HCT 23.1 (L) 10/30/2023 0554   HCT 29.3 (L) 09/20/2023 1211   PLT 434 (H) 10/30/2023 0554   PLT 490 (H) 09/20/2023 1211   MCV 82.5 10/30/2023 0554   MCV 89 09/20/2023 1211   NEUTROABS 3.9 10/29/2023 1136   NEUTROABS 5.0 09/20/2023 1211   LYMPHSABS 3.0 10/29/2023 1136   LYMPHSABS 3.5 (H) 09/20/2023 1211   MONOABS 0.7 10/29/2023 1136   EOSABS 0.0 10/29/2023 1136   EOSABS 0.1 09/20/2023 1211   BASOSABS 0.0 10/29/2023 1136   BASOSABS 0.0 09/20/2023 1211    No results found for this or any previous visit (from the past 240 hours).  Studies/Results: No results found.  Medications: Scheduled Meds:  enoxaparin (LOVENOX) injection  40  mg Subcutaneous QHS   folic acid  1 mg Oral Daily   HYDROmorphone   Intravenous Q4H   ketorolac  15 mg Intravenous Q6H   morphine  60 mg Oral Q12H   senna-docusate  1 tablet Oral BID   Continuous Infusions:  sodium chloride 10 mL/hr at 10/30/23 1111   PRN Meds:.cyclobenzaprine, diphenhydrAMINE, naloxone **AND** sodium chloride flush, ondansetron (ZOFRAN) IV, oxyCODONE, polyethylene glycol  Consultants: None  Procedures: None  Antibiotics: None  Assessment/Plan: Principal Problem:   Sickle cell anemia with crisis (HCC) Active Problems:   Anemia of chronic disease   Tobacco  abuse   Moderate episode of recurrent major depressive disorder (HCC)   Chronic pain syndrome  Hb Sickle Cell Disease with Pain crisis: Reduce IVF to [facility].  Continue weight based Dilaudid PCA at current dose setting, continue IV Toradol 15 mg Q 6 H, continue oral home pain medications as ordered.  Monitor vitals very closely, Re-evaluate pain scale regularly, 2 L of Oxygen by [place].  Patient encouraged to ambulate on the hallway today. Anemia of Chronic Disease: Hemoglobin is stable at baseline today.  Is no clinical indication for blood transfusion at this time.  Will continue to monitor closely and transfuse as appropriate. Chronic pain Syndrome: Restart and continue oral home pain medications. Major depressive disorder: Clinically stable.  Patient denies any suicidal ideations or thoughts.  Will continue her home medications.  Code Status: Full Code Family Communication: N/A Disposition Plan: Not yet ready for discharge  [place]  If 7PM-7AM, please contact night-coverage.  10/30/2023, 1:01 PM  LOS: 1 day

## 2023-10-31 DIAGNOSIS — D5700 Hb-SS disease with crisis, unspecified: Secondary | ICD-10-CM | POA: Diagnosis not present

## 2023-10-31 LAB — CBC
HCT: 30.5 % — ABNORMAL LOW (ref 36.0–46.0)
Hemoglobin: 10.5 g/dL — ABNORMAL LOW (ref 12.0–15.0)
MCH: 29.6 pg (ref 26.0–34.0)
MCHC: 34.4 g/dL (ref 30.0–36.0)
MCV: 85.9 fL (ref 80.0–100.0)
Platelets: 452 10*3/uL — ABNORMAL HIGH (ref 150–400)
RBC: 3.55 MIL/uL — ABNORMAL LOW (ref 3.87–5.11)
RDW: 15.9 % — ABNORMAL HIGH (ref 11.5–15.5)
WBC: 11 10*3/uL — ABNORMAL HIGH (ref 4.0–10.5)
nRBC: 0.4 % — ABNORMAL HIGH (ref 0.0–0.2)

## 2023-10-31 NOTE — Progress Notes (Signed)
   10/31/23 1230  TOC Brief Assessment  Insurance and Status Reviewed  Patient has primary care physician Yes  Home environment has been reviewed Private  Prior level of function: Independent  Prior/Current Home Services No current home services  Social Drivers of Health Review SDOH reviewed no interventions necessary  Readmission risk has been reviewed Yes  Transition of care needs no transition of care needs at this time

## 2023-10-31 NOTE — Plan of Care (Signed)
  Problem: Education: Goal: Knowledge of General Education information will improve Description Including pain rating scale, medication(s)/side effects and non-pharmacologic comfort measures Outcome: Progressing   

## 2023-10-31 NOTE — Progress Notes (Signed)
Patient ID: OPHIA KUEHNLE, female   DOB: Dec 20, 1990, 32 y.o.   MRN: [id number] Subjective: Carmen [name] is a 32 year old female with a medical history significant for sickle cell disease, chronic pain syndrome, opiate dependence and tolerance, major depression, and anemia of chronic disease was admitted for sickle cell pain crisis.   Patient claims overall she is much improved but still not at baseline.  She is requesting 1 more night in the hospital to get her lower back pain down to at least 5/10.  She has no fever.  She denies any chest pain or cough or nausea or vomiting or diarrhea.  No urinary symptoms.  Objective:  Vital signs in last 24 hours:  Vitals:   10/31/23 1342 10/31/23 1512 10/31/23 1513 10/31/23 1731  BP: 115/81   120/79  Pulse: (!) 59   62  Resp: 17 15 15 18   Temp: 98.2 F (36.8 C)   98.6 F (37 C)  TempSrc: Oral   Oral  SpO2: 98% 97% 97% 100%  Weight:      Height:        Intake/Output from previous day:   Intake/Output Summary (Last 24 hours) at 10/31/2023 1742 Last data filed at 10/31/2023 0900 Gross per 24 hour  Intake 240 ml  Output --  Net 240 ml    Physical Exam: General: Alert, awake, oriented x3, in no acute distress.  HEENT: [place]/AT PEERL, EOMI Neck: Trachea midline,  no masses, no thyromegal,y no JVD, no carotid bruit OROPHARYNX:  Moist, No exudate/ erythema/lesions.  Heart: Regular rate and rhythm, without murmurs, rubs, gallops, PMI non-displaced, no heaves or thrills on palpation.  Lungs: Clear to auscultation, no wheezing or rhonchi noted. No increased vocal fremitus resonant to percussion  Abdomen: Soft, nontender, nondistended, positive bowel sounds, no masses no hepatosplenomegaly noted..  Neuro: No focal neurological deficits noted cranial nerves II through XII grossly intact. DTRs 2+ bilaterally upper and lower extremities. Strength 5 out of 5 in bilateral upper and lower extremities. Musculoskeletal: No warm swelling or erythema around  joints, no spinal tenderness noted. Psychiatric: Patient alert and oriented x3, good insight and cognition, good recent to remote recall. Lymph node survey: No cervical axillary or inguinal lymphadenopathy noted.  Lab Results:  Basic Metabolic Panel:    Component Value Date/Time   NA 136 10/29/2023 1136   NA 140 09/20/2023 1211   K 4.6 10/29/2023 1136   CL 108 10/29/2023 1136   CO2 19 (L) 10/29/2023 1136   BUN 9 10/29/2023 1136   BUN 11 09/20/2023 1211   CREATININE 0.50 10/29/2023 1136   GLUCOSE 98 10/29/2023 1136   CALCIUM 9.0 10/29/2023 1136   CBC:    Component Value Date/Time   WBC 11.0 (H) 10/31/2023 0730   HGB 10.5 (L) 10/31/2023 0730   HGB 9.7 (L) 09/20/2023 1211   HCT 30.5 (L) 10/31/2023 0730   HCT 29.3 (L) 09/20/2023 1211   PLT 452 (H) 10/31/2023 0730   PLT 490 (H) 09/20/2023 1211   MCV 85.9 10/31/2023 0730   MCV 89 09/20/2023 1211   NEUTROABS 3.9 10/29/2023 1136   NEUTROABS 5.0 09/20/2023 1211   LYMPHSABS 3.0 10/29/2023 1136   LYMPHSABS 3.5 (H) 09/20/2023 1211   MONOABS 0.7 10/29/2023 1136   EOSABS 0.0 10/29/2023 1136   EOSABS 0.1 09/20/2023 1211   BASOSABS 0.0 10/29/2023 1136   BASOSABS 0.0 09/20/2023 1211    No results found for this or any previous visit (from the past 240 hours).  Studies/Results: No  results found.  Medications: Scheduled Meds:  enoxaparin (LOVENOX) injection  40 mg Subcutaneous QHS   folic acid  1 mg Oral Daily   HYDROmorphone   Intravenous Q4H   ketorolac  15 mg Intravenous Q6H   morphine  60 mg Oral Q12H   senna-docusate  1 tablet Oral BID   Continuous Infusions:   PRN Meds:.cyclobenzaprine, diphenhydrAMINE, naloxone **AND** sodium chloride flush, ondansetron (ZOFRAN) IV, oxyCODONE, polyethylene glycol  Consultants: None  Procedures: None  Antibiotics: None  Assessment/Plan: Principal Problem:   Sickle cell anemia with crisis (HCC) Active Problems:   Anemia of chronic disease   Tobacco abuse   Moderate  episode of recurrent major depressive disorder (HCC)   Chronic pain syndrome  Hb Sickle Cell Disease with Pain crisis: Continue IV fluid at [facility].  Will begin to wean weight based Dilaudid PCA in anticipation of discharge home tomorrow, continue IV Toradol 15 mg Q 6 H, continue oral home pain medications as ordered.  Monitor vitals very closely, Re-evaluate pain scale regularly, 2 L of Oxygen by [place].  Patient encouraged to ambulate on the hallway today. Anemia of Chronic Disease: Hemoglobin continues to be stable at baseline.  There is no clinical indication for blood transfusion at this time.  Will continue to monitor closely and transfuse as appropriate. Chronic pain Syndrome: Continue oral home pain medications as ordered. Major depressive disorder: Clinically stable.  Patient denies any suicidal ideations or thoughts.  Will continue her home medications.  Code Status: Full Code Family Communication: N/A Disposition Plan: For possible discharge tomorrow, 11/01/2023  Jeanann Lewandowsky  If 7PM-7AM, please contact night-coverage.  10/31/2023, 5:42 PM  LOS: 2 days

## 2023-10-31 NOTE — Plan of Care (Signed)

## 2023-11-01 DIAGNOSIS — Z79891 Long term (current) use of opiate analgesic: Secondary | ICD-10-CM | POA: Diagnosis not present

## 2023-11-01 DIAGNOSIS — D5700 Hb-SS disease with crisis, unspecified: Secondary | ICD-10-CM | POA: Diagnosis not present

## 2023-11-01 LAB — CBC
HCT: 27.1 % — ABNORMAL LOW (ref 36.0–46.0)
Hemoglobin: 9.7 g/dL — ABNORMAL LOW (ref 12.0–15.0)
MCH: 29.6 pg (ref 26.0–34.0)
MCHC: 35.8 g/dL (ref 30.0–36.0)
MCV: 82.6 fL (ref 80.0–100.0)
Platelets: 421 10*3/uL — ABNORMAL HIGH (ref 150–400)
RBC: 3.28 MIL/uL — ABNORMAL LOW (ref 3.87–5.11)
RDW: 15.3 % (ref 11.5–15.5)
WBC: 8.2 10*3/uL (ref 4.0–10.5)
nRBC: 0.7 % — ABNORMAL HIGH (ref 0.0–0.2)

## 2023-11-01 MED ORDER — MORPHINE SULFATE ER 60 MG PO TBCR: 60.0000 mg | EXTENDED_RELEASE_TABLET | Freq: Two times a day (BID) | ORAL | 0 refills | Status: DC

## 2023-11-01 NOTE — Discharge Summary (Signed)
 Physician Discharge Summary  Carmen [name] FMW:[id number] DOB: Jan 29, 1991 DOA: 10/29/2023  PCP: Tilford Bertram HERO, FNP  Admit date: 10/29/2023  Discharge date: 11/01/2023  Discharge Diagnoses:  Principal Problem:   Sickle cell anemia with crisis (HCC) Active Problems:   Anemia of chronic disease   Tobacco abuse   Moderate episode of recurrent major depressive disorder (HCC)   Chronic pain syndrome   Discharge Condition: Stable  Disposition:   Follow-up Information     Tilford Bertram HERO, FNP. Schedule an appointment as soon as possible for a visit in 1 week(s).   Specialty: Family Medicine Contact information: [address] [place] [address] [phone]                Pt is discharged home in good condition and is to follow up with Tilford Bertram HERO, FNP this week to have labs evaluated. Carmen [name] is instructed to increase activity slowly and balance with rest for the next few days, and use prescribed medication to complete treatment of pain  Diet: Regular Wt Readings from Last 3 Encounters:  10/29/23 81.6 kg  10/01/23 86.2 kg  09/20/23 81.6 kg    History of present illness:  Carmen [name]  is a 32 y.o. female with medical history significant for sickle cell disease, chronic pain syndrome, opiate dependence and tolerance, anemia of chronic disease and major depressive disorder, who presented to the emergency room today with major complaints of generalized body pain most especially in her bilateral hip joints that is consistent with her typical sickle cell pain crises.  She has taken her home pain medications with no sustained relief.  She says she got a little bit of relief in the emergency room after many doses of IV Dilaudid  but her hip pain would not just go down.  She is rating her pain at about 8/10 this evening.  She does not remember any inciting factor.  She denies any fever, cough, chest pain, shortness of breath, nausea, vomiting or  diarrhea.  No urinary symptoms.  She denies any recent travels, sick contacts or known exposure to COVID-19.   ED course: Her vitals showed: BP (!) 109/56 (BP Location: Left Arm)  Pulse 67  Temp 98.5 F (36.9 C) (Oral)  Resp 16  Ht 5' 3 (1.6 m)  Wt 81.6 kg  SpO2 100%  BMI 31.89 kg/m.  She was conscious and alert, in no acute distress.  Her hemoglobin is at baseline of 10.3, normal white cell count, slightly elevated platelet count, but this is chronic.  Comprehensive metabolic panel is largely unremarkable.  Patient received multiple doses of IV Dilaudid  and other pain regimen in the emergency room with no significant relief of her pain.  Patient will be admitted to the hospital for further evaluation and management of sickle cell pain crisis.  Hospital Course:  Patient was admitted for sickle cell pain crisis and managed appropriately with IVF, IV Dilaudid  via PCA and IV Toradol , as well as other adjunct therapies per sickle cell pain management protocols. Hgb remained stable at baseline throughout this admission, patient did not require blood transfusion. Her pain slowly responded to the above regimen and as at today, her pain has returned to baseline of 3/10. She's ambulating well with no significant pain and tolerating PO intake well. She requested to be discharged. Patient was therefore discharged home today in a hemodynamically stable condition.   Carmen [name] will follow-up with PCP within 1 week of this discharge. Carmen [name] was  counseled extensively about nonpharmacologic means of pain management, patient verbalized understanding and was appreciative of  the care received during this admission.   We discussed the need for good hydration, monitoring of hydration status, avoidance of heat, cold, stress, and infection triggers. We discussed the need to be adherent with taking Hydrea  and other home medications. Patient was reminded of the need to seek medical attention immediately if any symptom of  bleeding, anemia, or infection occurs.  Discharge Exam: Vitals:   11/01/23 0814 11/01/23 1037  BP:  121/77  Pulse:  74  Resp: 17 18  Temp:  98.6 F (37 C)  SpO2:  97%   Vitals:   11/01/23 0509 11/01/23 0619 11/01/23 0814 11/01/23 1037  BP:  114/73  121/77  Pulse:  69  74  Resp: 16 13 17 18   Temp:  98.7 F (37.1 C)  98.6 F (37 C)  TempSrc:  Oral  Oral  SpO2:  94%  97%  Weight:      Height:        General appearance : Awake, alert, not in any distress. Speech Clear. Not toxic looking HEENT: Atraumatic and Normocephalic, pupils equally reactive to light and accomodation Neck: Supple, no JVD. No cervical lymphadenopathy.  Chest: Good air entry bilaterally, no added sounds  CVS: S1 S2 regular, no murmurs.  Abdomen: Bowel sounds present, Non tender and not distended with no gaurding, rigidity or rebound. Extremities: B/L Lower Ext shows no edema, both legs are warm to touch Neurology: Awake alert, and oriented X 3, CN II-XII intact, Non focal Skin: No Rash  Discharge Instructions  Discharge Instructions     Diet - low sodium heart healthy   Complete by: As directed    Increase activity slowly   Complete by: As directed       Allergies as of 11/01/2023       Reactions   Ketamine  Hives, Nausea And Vomiting, Other (See Comments)   Makes the patient feel funny also        Medication List     TAKE these medications    Benadryl  Allergy 25 MG capsule Generic drug: diphenhydrAMINE  Take 25 mg by mouth every 6 (six) hours as needed for allergies.   cyclobenzaprine  10 MG tablet Commonly known as: FLEXERIL  Take 1 tablet (10 mg total) by mouth at bedtime. TAKE 1 TABLET BY MOUTH DAILY AS NEEDED FOR MUSCLE SPASMS. What changed:  when to take this reasons to take this additional instructions   folic acid  1 MG tablet Commonly known as: FOLVITE  Take 1 tablet (1 mg total) by mouth daily.   morphine  60 MG 12 hr tablet Commonly known as: MS CONTIN  Take 1 tablet  (60 mg total) by mouth every 12 (twelve) hours. Start taking on: November 02, 2023   Oxycodone  HCl 10 MG Tabs Take 1 tablet (10 mg total) by mouth every 6 (six) hours as needed (pain).   Vitamin D  (Ergocalciferol ) 1.25 MG (50000 UNIT) Caps capsule Commonly known as: DRISDOL  TAKE 1 CAPSULE BY MOUTH ONE TIME PER WEEK        The results of significant diagnostics from this hospitalization (including imaging, microbiology, ancillary and laboratory) are listed below for reference.    Significant Diagnostic Studies: No results found.  Microbiology: No results found for this or any previous visit (from the past 240 hours).   Labs: Basic Metabolic Panel: Recent Labs  Lab 10/29/23 1136  NA 136  K 4.6  CL 108  CO2 19*  GLUCOSE 98  BUN 9  CREATININE 0.50  CALCIUM  9.0   Liver Function Tests: Recent Labs  Lab 10/29/23 1136  AST 36  ALT 18  ALKPHOS 55  BILITOT 1.2*  PROT 7.8  ALBUMIN 4.1   No results for input(s): LIPASE, AMYLASE in the last 168 hours. No results for input(s): AMMONIA in the last 168 hours. CBC: Recent Labs  Lab 10/29/23 1136 10/30/23 0554 10/31/23 0730 11/01/23 0740  WBC 7.6 10.6* 11.0* 8.2  NEUTROABS 3.9  --   --   --   HGB 10.3* 8.5* 10.5* 9.7*  HCT 29.0* 23.1* 30.5* 27.1*  MCV 84.8 82.5 85.9 82.6  PLT 450* 434* 452* 421*   Cardiac Enzymes: No results for input(s): CKTOTAL, CKMB, CKMBINDEX, TROPONINI in the last 168 hours. BNP: Invalid input(s): POCBNP CBG: No results for input(s): GLUCAP in the last 168 hours.  Time coordinating discharge: 50 minutes  Signed:  Menelik Mcfarren  [facility] 11/01/2023, 10:54 AM

## 2023-11-01 NOTE — Plan of Care (Signed)
  Problem: Education: Goal: Knowledge of General Education information will improve Description: Including pain rating scale, medication(s)/side effects and non-pharmacologic comfort measures Outcome: Adequate for Discharge   Problem: Health Behavior/Discharge Planning: Goal: Ability to manage health-related needs will improve Outcome: Adequate for Discharge   Problem: Clinical Measurements: Goal: Ability to maintain clinical measurements within normal limits will improve Outcome: Adequate for Discharge Goal: Will remain free from infection Outcome: Adequate for Discharge Goal: Diagnostic test results will improve Outcome: Adequate for Discharge Goal: Respiratory complications will improve Outcome: Adequate for Discharge Goal: Cardiovascular complication will be avoided Outcome: Adequate for Discharge   Problem: Activity: Goal: Risk for activity intolerance will decrease Outcome: Adequate for Discharge   Problem: Nutrition: Goal: Adequate nutrition will be maintained Outcome: Adequate for Discharge   Problem: Coping: Goal: Level of anxiety will decrease Outcome: Adequate for Discharge   Problem: Elimination: Goal: Will not experience complications related to bowel motility Outcome: Adequate for Discharge Goal: Will not experience complications related to urinary retention Outcome: Adequate for Discharge   Problem: Pain Management: Goal: General experience of comfort will improve Outcome: Adequate for Discharge   Problem: Safety: Goal: Ability to remain free from injury will improve Outcome: Adequate for Discharge   Problem: Skin Integrity: Goal: Risk for impaired skin integrity will decrease Outcome: Adequate for Discharge   Problem: Education: Goal: Knowledge of vaso-occlusive preventative measures will improve Outcome: Adequate for Discharge Goal: Awareness of infection prevention will improve Outcome: Adequate for Discharge Goal: Awareness of signs and  symptoms of anemia will improve Outcome: Adequate for Discharge Goal: Long-term complications will improve Outcome: Adequate for Discharge   Problem: Self-Care: Goal: Ability to incorporate actions that prevent/reduce pain crisis will improve Outcome: Adequate for Discharge   Problem: Bowel/Gastric: Goal: Gut motility will be maintained Outcome: Adequate for Discharge   Problem: Tissue Perfusion: Goal: Complications related to inadequate tissue perfusion will be avoided or minimized Outcome: Adequate for Discharge   Problem: Respiratory: Goal: Pulmonary complications will be avoided or minimized Outcome: Adequate for Discharge

## 2023-11-01 NOTE — Progress Notes (Signed)
Discharge papers given to patient, next medications due written on discharge papers. Carmen [name] states her pain is a 3/10. She refuses a wheelchair at discharge. Patient has one prescription to pick up at her pharmacy.

## 2023-11-09 ENCOUNTER — Other Ambulatory Visit: Payer: Self-pay | Admitting: Internal Medicine

## 2023-11-09 DIAGNOSIS — D571 Sickle-cell disease without crisis: Secondary | ICD-10-CM

## 2023-11-09 DIAGNOSIS — Z79891 Long term (current) use of opiate analgesic: Secondary | ICD-10-CM

## 2023-11-09 NOTE — Telephone Encounter (Signed)
 Please advise until pt is seen by [facility]. Thanks [employer]

## 2023-11-10 ENCOUNTER — Ambulatory Visit: Payer: Self-pay

## 2023-11-10 ENCOUNTER — Other Ambulatory Visit: Payer: Self-pay | Admitting: Family Medicine

## 2023-11-10 DIAGNOSIS — Z79891 Long term (current) use of opiate analgesic: Secondary | ICD-10-CM

## 2023-11-10 DIAGNOSIS — D571 Sickle-cell disease without crisis: Secondary | ICD-10-CM

## 2023-11-10 MED ORDER — OXYCODONE HCL 10 MG PO TABS: 10.0000 mg | ORAL_TABLET | Freq: Four times a day (QID) | ORAL | 0 refills | Status: DC | PRN

## 2023-11-10 NOTE — Telephone Encounter (Signed)
 Copied from CRM [id number]. Topic: Clinical - Medication Refill >> Nov 10, 2023 10:57 AM Chase BROCKS wrote: Most Recent Primary Care Visit:  Provider: TILFORD BERTRAM HERO  Department: [facility]  Visit Type: OFFICE VISIT  Date: 09/20/2023  Medication: Oxycodone  HCl 10 MG TABS  Has the patient contacted their pharmacy? Yes (Agent: If no, request that the patient contact the pharmacy for the refill. If patient does not wish to contact the pharmacy document the reason why and proceed with request.) (Agent: If yes, when and what did the pharmacy advise?)  Is this the correct pharmacy for this prescription? Yes If no, delete pharmacy and type the correct one.  This is the patient's preferred pharmacy:  [id number] - [place], [place] - [address] AT [facility] [address] [place] [address] Phone: [phone] Fax: [phone]   Has the prescription been filled recently? Yes  Is the patient out of the medication? Yes  Has the patient been seen for an appointment in the last year OR does the patient have an upcoming appointment? Yes  Can we respond through MyChart? Yes  Agent: Please be advised that Rx refills may take up to 3 business days. We ask that you follow-up with your pharmacy.

## 2023-11-10 NOTE — Telephone Encounter (Addendum)
 Sent to Dr. Hyman Hopes for approval. [facility]

## 2023-11-22 ENCOUNTER — Other Ambulatory Visit: Payer: Self-pay | Admitting: Internal Medicine

## 2023-11-22 DIAGNOSIS — Z79891 Long term (current) use of opiate analgesic: Secondary | ICD-10-CM

## 2023-11-22 DIAGNOSIS — D571 Sickle-cell disease without crisis: Secondary | ICD-10-CM

## 2023-11-27 ENCOUNTER — Other Ambulatory Visit: Payer: Self-pay | Admitting: Internal Medicine

## 2023-11-27 DIAGNOSIS — Z79891 Long term (current) use of opiate analgesic: Secondary | ICD-10-CM

## 2023-11-27 DIAGNOSIS — D571 Sickle-cell disease without crisis: Secondary | ICD-10-CM

## 2023-11-27 MED ORDER — OXYCODONE HCL 10 MG PO TABS: 10.0000 mg | ORAL_TABLET | Freq: Four times a day (QID) | ORAL | 0 refills | Status: DC | PRN

## 2023-11-29 ENCOUNTER — Other Ambulatory Visit: Payer: Self-pay | Admitting: Internal Medicine

## 2023-11-29 DIAGNOSIS — D571 Sickle-cell disease without crisis: Secondary | ICD-10-CM

## 2023-11-29 DIAGNOSIS — Z79891 Long term (current) use of opiate analgesic: Secondary | ICD-10-CM

## 2023-11-30 MED ORDER — OXYCODONE HCL 10 MG PO TABS: 10.0000 mg | ORAL_TABLET | Freq: Four times a day (QID) | ORAL | 0 refills | Status: DC | PRN

## 2023-11-30 MED ORDER — MORPHINE SULFATE ER 60 MG PO TBCR: 60.0000 mg | EXTENDED_RELEASE_TABLET | Freq: Two times a day (BID) | ORAL | 0 refills | Status: DC

## 2023-11-30 NOTE — Telephone Encounter (Signed)
Please advise. Pt has upcoming appt with [facility] 12/27/23. Thanks [employer]

## 2023-12-27 ENCOUNTER — Other Ambulatory Visit: Payer: Self-pay | Admitting: Internal Medicine

## 2023-12-27 ENCOUNTER — Ambulatory Visit (INDEPENDENT_AMBULATORY_CARE_PROVIDER_SITE_OTHER): Payer: MEDICAID | Admitting: Nurse Practitioner

## 2023-12-27 VITALS — BP 101/61 | HR 88 | Temp 97.7°F | Wt 186.0 lb

## 2023-12-27 DIAGNOSIS — Z309 Encounter for contraceptive management, unspecified: Secondary | ICD-10-CM | POA: Insufficient documentation

## 2023-12-27 DIAGNOSIS — F17200 Nicotine dependence, unspecified, uncomplicated: Secondary | ICD-10-CM

## 2023-12-27 DIAGNOSIS — Z79891 Long term (current) use of opiate analgesic: Secondary | ICD-10-CM | POA: Diagnosis not present

## 2023-12-27 DIAGNOSIS — E559 Vitamin D deficiency, unspecified: Secondary | ICD-10-CM

## 2023-12-27 DIAGNOSIS — D571 Sickle-cell disease without crisis: Secondary | ICD-10-CM

## 2023-12-27 DIAGNOSIS — F1290 Cannabis use, unspecified, uncomplicated: Secondary | ICD-10-CM | POA: Insufficient documentation

## 2023-12-27 MED ORDER — VITAMIN D (ERGOCALCIFEROL) 1.25 MG (50000 UNIT) PO CAPS: ORAL_CAPSULE | ORAL | 4 refills | Status: AC

## 2023-12-27 MED ORDER — FOLIC ACID 1 MG PO TABS: 1.0000 mg | ORAL_TABLET | Freq: Every day | ORAL | 3 refills | Status: DC

## 2023-12-27 MED ORDER — NICOTINE 7 MG/24HR TD PT24: 7.0000 mg | MEDICATED_PATCH | Freq: Every day | TRANSDERMAL | 0 refills | Status: DC

## 2023-12-27 MED ORDER — HYDROXYUREA 500 MG PO CAPS: 500.0000 mg | ORAL_CAPSULE | Freq: Every day | ORAL | 1 refills | Status: DC

## 2023-12-27 MED ORDER — NICOTINE 14 MG/24HR TD PT24: 14.0000 mg | MEDICATED_PATCH | Freq: Every day | TRANSDERMAL | 0 refills | Status: AC

## 2023-12-27 NOTE — Assessment & Plan Note (Signed)
 Smokes about 6 cigarettes every other day  Asked about quitting: confirms that he/she currently smokes cigarettes Advise to quit smoking: Educated about QUITTING to reduce the risk of cancer, cardio and cerebrovascular disease. Assess willingness: willing to quit at this time,  Assist with counseling and pharmacotherapy: Counseled for 5 minutes and literature provided. Arrange for follow up: follow up in 2 months and continue to offer help.   - nicotine (NICODERM CQ - DOSED IN MG/24 HOURS) 14 mg/24hr patch; Place 1 patch (14 mg total) onto the skin daily.  Dispense: 42 patch; Refill: 0 - nicotine (NICODERM CQ - DOSED IN MG/24 HR) 7 mg/24hr patch; Place 1 patch (7 mg total) onto the skin daily.  Dispense: 14 patch; Refill: 0

## 2023-12-27 NOTE — Assessment & Plan Note (Addendum)
 Continue MS Contin 60 mg every 12 hours, oxycodone 10 mg every 6 hours as needed  Continue MS continue 60 mg twice daily, oxycodone 10 mg every 6 hours as needed Encouraged to follow-up with hematologist at [facility].  - Sickle Cell Panel - Reticulocytes - ToxAssure Flex 15, Ur - Ambulatory referral to Ophthalmology

## 2023-12-27 NOTE — Assessment & Plan Note (Addendum)
 Sickle cell disease -restart Hydrea 500 mg daily. Folic acid 1 mg daily to prevent aplastic bone marrow crises.  Will check sickle cell panel for absolute neutrophil count and platelets. Will also check reticulocyte count.  We discussed the need for good hydration, monitoring of hydration status, avoidance of heat, cold, stress, and infection triggers. We discussed the risks and benefits of Hydrea, including bone marrow suppression, the possibility of GI upset, skin ulcers, hair thinning, and teratogenicity. Discussed the needed to use contraception while on hydroxyurea due to teratogenicity. The patient was reminded of the need to seek medical attention of any symptoms of bleeding, anemia, or infection.    Eye - High risk of proliferative retinopathy. Annual eye exam with retinal exam recommended to patient..  Patient referred to ophthalmologist  Immunization status - Patient up-to-date with flu vaccine, Tdap vaccine, pneumococcal vaccine  Acute and chronic painful episodes -  .  We discussed that pt is to receive her Schedule II prescriptions only from [place]. Pt is also aware that the prescription history is available to [place] online through the [facility] CSRS. We reminded the patient that all patients receiving Schedule II narcotics must be seen for follow within the 3 months in the office.  Opioid prescription agreement form signed today we reviewed the terms of our pain agreement, including the need to keep medicines in a safe locked location away from children or pets, and the need to report excess sedation or constipation, measures to avoid constipation, and policies related to early refills and stolen prescriptions. According to the [place] [facility] program, we have reviewed details related to analgesia, adverse effects, aberrant behaviors. Reviewed  Substance Reporting system prior to prescribing opiate medication, no inconsistencies noted.   Continue MS Contin 60 mg every 12 hours, oxycodone 10 mg  every 6 hours as needed  1. 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  Dispense: 90 capsule; Refill: 1

## 2023-12-27 NOTE — Progress Notes (Signed)
 New Patient Office Visit  Subjective:  Patient ID: Carmen [name], female    DOB: 1991/06/21  Age: 33 y.o. MRN: [id number]  CC:  Chief Complaint  Patient presents with   Sickle Cell Anemia    HPI Carmen [name] is a 33 y.o. female  has a past medical history of Anxiety, Headache(784.0), Heart murmur, Sickle cell crisis (HCC), Syphilis (2015), and Thrombocytosis (11/22/2014).  Patient presented establish care for her chronic medical conditions.  Previous PCP [name] NP  Sickle cell disease.  She was on hydroxyurea 500 mg daily was tolerating the medication well but the prescription expired, takes folic acid 1 mg daily,  MS Contin and oxycodone for chronic pain.  She is established with [facility] hematologist but has not seen them in about a year. Her chronic pain is currently a 5/10, mainly in her lower back and hips, oxycodone was last taking this morning.  She currently denies fever, chills, chest pain, shortness of breath, abdominal pain, nausea, vomiting, diarrhea   Tobacco use disorder.  Smokes about 5 cigarettes every other day, also vapes marijuana.  Patient denies shortness of breath, cough, wheezing.  She is interested in smoking cessation  Patient requesting for referral to OB/GYN for contraceptive management.  She was on Depo-Provera injection but would like to consider other forms of contraception other than pills and dep provera injection.     Past Medical History:  Diagnosis Date   Anxiety    Headache(784.0)    Heart murmur    Sickle cell crisis (HCC)    Syphilis 2015   Was diagnosed and received one injection of antibiotics   Thrombocytosis 11/22/2014     CBC Latest Ref Rng & Units 12/13/2018 12/11/2018 12/10/2018 WBC 4.0 - 10.5 K/uL 14.8(H) 13.2(H) 15.9(H) Hemoglobin 12.0 - 15.0 g/dL 8.2(L) 7.7(L) 8.1(L) Hematocrit 36.0 - 46.0 % 23.7(L) 23.2(L) 24.5(L) Platelets 150 - 400 K/uL 326 399 388      Past Surgical History:  Procedure Laterality Date   CESAREAN  SECTION N/A 02/05/2019   Procedure: CESAREAN SECTION;  Surgeon: Hermina Staggers, MD;  Location: [facility];  Service: Obstetrics;  Laterality: N/A;   CHOLECYSTECTOMY N/A 11/30/2014   Procedure: LAPAROSCOPIC CHOLECYSTECTOMY SINGLE SITE WITH INTRAOPERATIVE CHOLANGIOGRAM;  Surgeon: Karie Soda, MD;  Location: [facility] ORS;  Service: General;  Laterality: N/A;   SPLENECTOMY      Family History  Problem Relation Age of Onset   Hypertension Mother    Sickle cell anemia Sister    Kidney disease Sister        Lupus   Arthritis Sister    Sickle cell anemia Sister    Sickle cell trait Sister    Heart disease Maternal Aunt        CABG   Heart disease Maternal Uncle        CABG   Lupus Sister     Social History   Socioeconomic History   Marital status: Single    Spouse name: Not on file   Number of children: 1   Years of education: Not on file   Highest education level: 12th grade  Occupational History   Not on file  Tobacco Use   Smoking status: Some Days    Types: Cigarettes   Smokeless tobacco: Never  Vaping Use   Vaping status: Never Used  Substance and Sexual Activity   Alcohol use: No   Drug use: Yes    Comment: marijuana   Sexual activity: Yes  Birth control/protection: Injection  Other Topics Concern   Not on file  Social History Narrative   Lives with her mother and her son   Social Drivers of Health   Financial Resource Strain: Medium Risk (12/27/2023)   Overall Financial Resource Strain (CARDIA)    Difficulty of Paying Living Expenses: Somewhat hard  Food Insecurity: Food Insecurity Present (12/27/2023)   Hunger Vital Sign    Worried About Running Out of Food in the Last Year: Patient declined    Ran Out of Food in the Last Year: Sometimes true  Transportation Needs: Unmet Transportation Needs (12/27/2023)   PRAPARE - Transportation    Lack of Transportation (Medical): Yes    Lack of Transportation (Non-Medical): Yes  Physical Activity: Insufficiently Active  (12/27/2023)   Exercise Vital Sign    Days of Exercise per Week: 2 days    Minutes of Exercise per Session: 20 min  Stress: Stress Concern Present (12/27/2023)   [employer] of Occupational Health - Occupational Stress Questionnaire    Feeling of Stress : Very much  Social Connections: Unknown (12/27/2023)   Social Connection and Isolation Panel [NHANES]    Frequency of Communication with Friends and Family: More than three times a week    Frequency of Social Gatherings with Friends and Family: Once a week    Attends Religious Services: Patient declined    Database administrator or Organizations: No    Attends Engineer, structural: Not on file    Marital Status: Never married  Intimate Partner Violence: Not At Risk (10/29/2023)   Humiliation, Afraid, Rape, and Kick questionnaire    Fear of Current or Ex-Partner: No    Emotionally Abused: No    Physically Abused: No    Sexually Abused: No    ROS Review of Systems  Constitutional:  Negative for appetite change, chills, fatigue and fever.  HENT:  Negative for congestion, postnasal drip, rhinorrhea and sneezing.   Respiratory:  Negative for cough, shortness of breath and wheezing.   Cardiovascular:  Negative for chest pain, palpitations and leg swelling.  Gastrointestinal:  Negative for abdominal pain, constipation, nausea and vomiting.  Genitourinary:  Negative for difficulty urinating, dysuria, flank pain and frequency.  Musculoskeletal:  Positive for arthralgias and back pain. Negative for joint swelling and myalgias.  Skin:  Negative for color change, pallor, rash and wound.  Neurological:  Negative for dizziness, facial asymmetry, weakness, numbness and headaches.  Psychiatric/Behavioral:  Negative for behavioral problems, confusion, self-injury and suicidal ideas.     Objective:   Today's Vitals: BP 101/61   Pulse 88   Temp 97.7 F (36.5 C)   Wt 186 lb (84.4 kg)   SpO2 100%   BMI 32.95 kg/m   Physical  Exam Vitals and nursing note reviewed.  Constitutional:      General: She is not in acute distress.    Appearance: Normal appearance. She is obese. She is not ill-appearing, toxic-appearing or diaphoretic.  HENT:     Mouth/Throat:     Mouth: Mucous membranes are moist.     Pharynx: Oropharynx is clear. No oropharyngeal exudate or posterior oropharyngeal erythema.  Eyes:     General: Scleral icterus present.        Right eye: No discharge.        Left eye: No discharge.     Extraocular Movements: Extraocular movements intact.     Conjunctiva/sclera: Conjunctivae normal.  Cardiovascular:     Rate and Rhythm: Normal rate and regular  rhythm.     Pulses: Normal pulses.     Heart sounds: Normal heart sounds. No murmur heard.    No friction rub. No gallop.  Pulmonary:     Effort: Pulmonary effort is normal. No respiratory distress.     Breath sounds: Normal breath sounds. No stridor. No wheezing, rhonchi or rales.  Chest:     Chest wall: No tenderness.  Abdominal:     General: There is no distension.     Palpations: Abdomen is soft.     Tenderness: There is no abdominal tenderness. There is no right CVA tenderness, left CVA tenderness or guarding.  Musculoskeletal:        General: No swelling, deformity or signs of injury.     Right lower leg: No edema.     Left lower leg: No edema.  Skin:    General: Skin is warm and dry.     Capillary Refill: Capillary refill takes less than 2 seconds.     Coloration: Skin is not jaundiced or pale.     Findings: No bruising, erythema or lesion.  Neurological:     Mental Status: She is alert and oriented to person, place, and time.     Motor: No weakness.     Coordination: Coordination normal.     Gait: Gait normal.  Psychiatric:        Mood and Affect: Mood normal.        Behavior: Behavior normal.        Thought Content: Thought content normal.        Judgment: Judgment normal.     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 Folic acid 1 mg daily to prevent aplastic bone marrow crises.  Will check sickle cell panel for absolute neutrophil count and platelets. Will also check reticulocyte count.  We discussed the need for good hydration, monitoring of hydration status, avoidance of heat, cold, stress, and infection triggers. We discussed the risks and benefits of Hydrea, including bone marrow suppression, the possibility of GI upset, skin ulcers, hair thinning, and teratogenicity. Discussed the needed to use contraception while on hydroxyurea due to teratogenicity. The patient was reminded of the need to seek medical attention of any symptoms of bleeding, anemia, or infection.    Eye - High risk of proliferative retinopathy. Annual eye exam with retinal exam recommended to patient..  Patient referred to ophthalmologist  Immunization status - Patient up-to-date with flu vaccine, Tdap vaccine, pneumococcal vaccine  Acute and chronic painful episodes -  .  We discussed that pt is to receive her Schedule II prescriptions only from [place]. Pt is also aware that the prescription history is available to [place] online through the [facility] CSRS. We reminded the patient that all patients receiving Schedule II narcotics must be seen for follow within the 3 months in the office.  Opioid prescription agreement form signed today we reviewed the terms of our pain agreement, including the need to keep medicines in a safe locked location away from children or pets, and the need to report excess sedation or constipation, measures to avoid constipation, and policies related to early refills and stolen prescriptions. According to the [place] [facility] program, we have reviewed details related to analgesia, adverse effects, aberrant behaviors. Reviewed [place] Substance Reporting system prior to prescribing opiate  medication,  no inconsistencies noted.   Continue MS Contin 60 mg every 12 hours, oxycodone 10 mg every 6 hours as needed  1. 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  Dispense: 90 capsule; Refill: 1       Relevant Medications   hydroxyurea (HYDREA) 500 MG capsule   folic acid (FOLVITE) 1 MG tablet   Other Relevant Orders   Sickle Cell Panel   Reticulocytes   ToxAssure Flex 15, Ur   Ambulatory referral to Ophthalmology   Vitamin D deficiency   Relevant Medications   Vitamin D, Ergocalciferol, (DRISDOL) 1.25 MG (50000 UNIT) CAPS capsule   Chronic prescription opiate use   Continue MS Contin 60 mg every 12 hours, oxycodone 10 mg every 6 hours as needed  Continue MS continue 60 mg twice daily, oxycodone 10 mg every 6 hours as needed Encouraged to follow-up with hematologist at [facility].  - Sickle Cell Panel - Reticulocytes - ToxAssure Flex 15, Ur - Ambulatory referral to Ophthalmology        Relevant Orders   Sickle Cell Panel   Reticulocytes   ToxAssure Flex 15, Ur   Ambulatory referral to Ophthalmology   Tobacco use disorder   Smokes about 6 cigarettes every other day  Asked about quitting: confirms that he/she currently smokes cigarettes Advise to quit smoking: Educated about QUITTING to reduce the risk of cancer, cardio and cerebrovascular disease. Assess willingness: willing to quit at this time,  Assist with counseling and pharmacotherapy: Counseled for 5 minutes and literature provided. Arrange for follow up: follow up in 2 months and continue to offer help.   - nicotine (NICODERM CQ - DOSED IN MG/24 HOURS) 14 mg/24hr patch; Place 1 patch (14 mg total) onto the skin daily.  Dispense: 42 patch; Refill: 0 - nicotine (NICODERM CQ - DOSED IN MG/24 HR)  7 mg/24hr patch; Place 1 patch (7 mg total) onto the skin daily.  Dispense: 14 patch; Refill: 0        Relevant Medications   nicotine (NICODERM CQ - DOSED IN MG/24 HOURS) 14 mg/24hr patch   nicotine (NICODERM CQ - DOSED IN MG/24 HR) 7 mg/24hr patch (Start on 02/08/2024)   Marijuana smoker   Cessation encouraged, risk of overdose discussed       Encounter for contraceptive management   Patient referred to gynecologist      Relevant Orders   Ambulatory referral to Gynecology    Outpatient Encounter Medications as of 12/27/2023  Medication Sig   BENADRYL ALLERGY 25 MG capsule Take 25 mg by mouth every 6 (six) hours as needed for allergies.   cyclobenzaprine (FLEXERIL) 10 MG tablet Take 1 tablet (10 mg total) by mouth at bedtime. TAKE 1 TABLET BY MOUTH DAILY AS NEEDED FOR MUSCLE SPASMS. (Patient taking differently: Take 10 mg by mouth daily as needed for muscle spasms.)   morphine (MS CONTIN) 60 MG 12 hr tablet Take 1 tablet (60 mg total) by mouth every 12 (twelve) hours.   nicotine (NICODERM CQ - DOSED IN MG/24 HOURS) 14 mg/24hr patch Place 1 patch (14 mg total) onto the skin daily.   [START ON 02/08/2024] nicotine (NICODERM CQ - DOSED IN MG/24 HR) 7 mg/24hr patch Place 1 patch (7 mg total) onto the skin daily.  Oxycodone HCl 10 MG TABS Take 1 tablet (10 mg total) by mouth every 6 (six) hours as needed (pain).   [DISCONTINUED] folic acid (FOLVITE) 1 MG tablet Take 1 tablet (1 mg total) by mouth daily.   [DISCONTINUED] Oxycodone HCl 10 MG TABS Take 1 tablet (10 mg total) by mouth every 6 (six) hours as needed (pain).   [DISCONTINUED] Vitamin D, Ergocalciferol, (DRISDOL) 1.25 MG (50000 UNIT) CAPS capsule TAKE 1 CAPSULE BY MOUTH ONE TIME PER WEEK   folic acid (FOLVITE) 1 MG tablet Take 1 tablet (1 mg total) by mouth daily.   hydroxyurea (HYDREA) 500 MG capsule Take 1 capsule (500 mg total) by mouth daily.   Vitamin D, Ergocalciferol, (DRISDOL) 1.25 MG (50000 UNIT) CAPS capsule TAKE 1 CAPSULE  BY MOUTH ONE TIME PER WEEK   No facility-administered encounter medications on file as of 12/27/2023.    Follow-up: Return in about 2 months (around 02/24/2024) for CPE.   Donell Beers, FNP

## 2023-12-27 NOTE — Patient Instructions (Addendum)
 .   Tobacco use disorder  - nicotine (NICODERM CQ - DOSED IN MG/24 HOURS) 14 mg/24hr patch; Place 1 patch (14 mg total) onto the skin daily.  Dispense: 42 patch; Refill: 0 - nicotine (NICODERM CQ - DOSED IN MG/24 HR) 7 mg/24hr patch; Place 1 patch (7 mg total) onto the skin daily.  Dispense: 14 patch; Refill: 0  . Encounter for contraceptive management, unspecified type  - Ambulatory referral to Gynecology    It is important that you exercise regularly at least 30 minutes 5 times a week as tolerated  Think about what you will eat, plan ahead. Choose " clean, green, fresh or frozen" over canned, processed or packaged foods which are more sugary, salty and fatty. 70 to 75% of food eaten should be vegetables and fruit. Three meals at set times with snacks allowed between meals, but they must be fruit or vegetables. Aim to eat over a 12 hour period , example 7 am to 7 pm, and STOP after  your last meal of the day. Drink water,generally about 64 ounces per day, no other drink is as healthy. Fruit juice is best enjoyed in a healthy way, by EATING the fruit.  Thanks for choosing [facility] we consider it a privelige to serve you.

## 2023-12-27 NOTE — Assessment & Plan Note (Signed)
 Patient referred to gynecologist

## 2023-12-27 NOTE — Assessment & Plan Note (Signed)
 Cessation encouraged, risk of overdose discussed

## 2023-12-28 ENCOUNTER — Other Ambulatory Visit: Payer: Self-pay | Admitting: Nurse Practitioner

## 2023-12-28 ENCOUNTER — Other Ambulatory Visit: Payer: Self-pay

## 2023-12-28 ENCOUNTER — Emergency Department (HOSPITAL_COMMUNITY)
Admission: EM | Admit: 2023-12-28 | Discharge: 2023-12-28 | Disposition: A | Discharge status: Home / Self Care | Payer: MEDICAID | Attending: Emergency Medicine | Admitting: Emergency Medicine

## 2023-12-28 ENCOUNTER — Encounter (HOSPITAL_COMMUNITY): Payer: Self-pay

## 2023-12-28 DIAGNOSIS — D5700 Hb-SS disease with crisis, unspecified: Secondary | ICD-10-CM | POA: Diagnosis present

## 2023-12-28 DIAGNOSIS — D571 Sickle-cell disease without crisis: Secondary | ICD-10-CM

## 2023-12-28 DIAGNOSIS — Z79891 Long term (current) use of opiate analgesic: Secondary | ICD-10-CM

## 2023-12-28 LAB — CMP14+CBC/D/PLT+FER+RETIC+V...
ALT: 13 [IU]/L (ref 0–32)
AST: 20 [IU]/L (ref 0–40)
Albumin: 4.2 g/dL (ref 4.0–5.0)
Alkaline Phosphatase: 62 [IU]/L (ref 44–121)
BUN/Creatinine Ratio: 15 (ref 9–23)
BUN: 9 mg/dL (ref 6–20)
Basophils Absolute: 0 10*3/uL (ref 0.0–0.2)
Basos: 0 %
Bilirubin Total: 1.2 mg/dL (ref 0.0–1.2)
CO2: 21 mmol/L (ref 20–29)
Calcium: 9.3 mg/dL (ref 8.7–10.2)
Chloride: 105 mmol/L (ref 96–106)
Creatinine, Ser: 0.61 mg/dL (ref 0.57–1.00)
EOS (ABSOLUTE): 0.2 10*3/uL (ref 0.0–0.4)
Eos: 2 %
Ferritin: 345 ng/mL — ABNORMAL HIGH (ref 15–150)
Globulin, Total: 3 g/dL (ref 1.5–4.5)
Glucose: 76 mg/dL (ref 70–99)
Hematocrit: 27.7 % — ABNORMAL LOW (ref 34.0–46.6)
Hemoglobin: 9.3 g/dL — ABNORMAL LOW (ref 11.1–15.9)
Immature Grans (Abs): 0 10*3/uL (ref 0.0–0.1)
Immature Granulocytes: 0 %
Lymphocytes Absolute: 4.6 10*3/uL — ABNORMAL HIGH (ref 0.7–3.1)
Lymphs: 50 %
MCH: 30.2 pg (ref 26.6–33.0)
MCHC: 33.6 g/dL (ref 31.5–35.7)
MCV: 90 fL (ref 79–97)
Monocytes Absolute: 0.8 10*3/uL (ref 0.1–0.9)
Monocytes: 9 %
Neutrophils Absolute: 3.6 10*3/uL (ref 1.4–7.0)
Neutrophils: 39 %
Platelets: 465 10*3/uL — ABNORMAL HIGH (ref 150–450)
Potassium: 4.3 mmol/L (ref 3.5–5.2)
RBC: 3.08 x10E6/uL — ABNORMAL LOW (ref 3.77–5.28)
RDW: 15.2 % (ref 11.7–15.4)
Retic Ct Pct: 4.9 % — ABNORMAL HIGH (ref 0.6–2.6)
Sodium: 139 mmol/L (ref 134–144)
Total Protein: 7.2 g/dL (ref 6.0–8.5)
Vit D, 25-Hydroxy: 35.8 ng/mL (ref 30.0–100.0)
WBC: 9.2 10*3/uL (ref 3.4–10.8)
eGFR: 123 mL/min/{1.73_m2} (ref >59)

## 2023-12-28 LAB — COMPREHENSIVE METABOLIC PANEL
ALT: 16 U/L (ref 0–44)
AST: 22 U/L (ref 15–41)
Albumin: 4.2 g/dL (ref 3.5–5.0)
Alkaline Phosphatase: 55 U/L (ref 38–126)
Anion gap: 10 (ref 5–15)
BUN: 10 mg/dL (ref 6–20)
CO2: 21 mmol/L — ABNORMAL LOW (ref 22–32)
Calcium: 9.4 mg/dL (ref 8.9–10.3)
Chloride: 107 mmol/L (ref 98–111)
Creatinine, Ser: 0.54 mg/dL (ref 0.44–1.00)
GFR, Estimated: >60 mL/min (ref >60)
Glucose, Bld: 93 mg/dL (ref 70–99)
Potassium: 3.6 mmol/L (ref 3.5–5.1)
Sodium: 138 mmol/L (ref 135–145)
Total Bilirubin: 1.7 mg/dL — ABNORMAL HIGH (ref 0.0–1.2)
Total Protein: 8 g/dL (ref 6.5–8.1)

## 2023-12-28 LAB — CBC WITH DIFFERENTIAL/PLATELET
Abs Immature Granulocytes: 0.05 10*3/uL (ref 0.00–0.07)
Basophils Absolute: 0.1 10*3/uL (ref 0.0–0.1)
Basophils Relative: 1 %
Eosinophils Absolute: 0.1 10*3/uL (ref 0.0–0.5)
Eosinophils Relative: 1 %
HCT: 28.2 % — ABNORMAL LOW (ref 36.0–46.0)
Hemoglobin: 10.1 g/dL — ABNORMAL LOW (ref 12.0–15.0)
Immature Granulocytes: 1 %
Lymphocytes Relative: 29 %
Lymphs Abs: 2.7 10*3/uL (ref 0.7–4.0)
MCH: 29.9 pg (ref 26.0–34.0)
MCHC: 35.8 g/dL (ref 30.0–36.0)
MCV: 83.4 fL (ref 80.0–100.0)
Monocytes Absolute: 1.2 10*3/uL — ABNORMAL HIGH (ref 0.1–1.0)
Monocytes Relative: 13 %
Neutro Abs: 5.3 10*3/uL (ref 1.7–7.7)
Neutrophils Relative %: 55 %
Platelets: 518 10*3/uL — ABNORMAL HIGH (ref 150–400)
RBC: 3.38 MIL/uL — ABNORMAL LOW (ref 3.87–5.11)
RDW: 14 % (ref 11.5–15.5)
WBC: 9.4 10*3/uL (ref 4.0–10.5)
nRBC: 0.3 % — ABNORMAL HIGH (ref 0.0–0.2)

## 2023-12-28 LAB — RETICULOCYTES
Immature Retic Fract: 37.6 % — ABNORMAL HIGH (ref 2.3–15.9)
RBC.: 3.4 MIL/uL — ABNORMAL LOW (ref 3.87–5.11)
Retic Count, Absolute: 184.6 10*3/uL (ref 19.0–186.0)
Retic Ct Pct: 5.4 % — ABNORMAL HIGH (ref 0.4–3.1)

## 2023-12-28 MED ORDER — OXYCODONE HCL 5 MG PO TABS
15.0000 mg | ORAL_TABLET | Freq: Once | ORAL | Status: AC
  Administered 2023-12-28: 15 mg via ORAL
  Filled 2023-12-28: qty 3

## 2023-12-28 MED ORDER — HYDROMORPHONE HCL 2 MG/ML IJ SOLN
2.0000 mg | INTRAMUSCULAR | Status: AC
  Administered 2023-12-28: 2 mg via INTRAVENOUS
  Filled 2023-12-28: qty 1

## 2023-12-28 MED ORDER — ONDANSETRON HCL 4 MG/2ML IJ SOLN: 4.0000 mg | INTRAMUSCULAR | Status: DC | PRN

## 2023-12-28 MED ORDER — DIPHENHYDRAMINE HCL 25 MG PO CAPS
25.0000 mg | ORAL_CAPSULE | ORAL | Status: DC | PRN
  Administered 2023-12-28: 25 mg via ORAL
  Filled 2023-12-28: qty 1

## 2023-12-28 MED ORDER — MORPHINE SULFATE ER 60 MG PO TBCR: 60.0000 mg | EXTENDED_RELEASE_TABLET | Freq: Two times a day (BID) | ORAL | 0 refills | Status: DC

## 2023-12-28 MED ORDER — KETOROLAC TROMETHAMINE 15 MG/ML IJ SOLN
15.0000 mg | Freq: Once | INTRAMUSCULAR | Status: AC
  Administered 2023-12-28: 15 mg via INTRAVENOUS
  Filled 2023-12-28: qty 1

## 2023-12-28 NOTE — Telephone Encounter (Signed)
 Please advise [facility]

## 2023-12-28 NOTE — ED Triage Notes (Signed)
 Patient is here for evaluation of sickle cell pain. Reports seeing sickle cell doctor yesterday and was told if her pain did not get any better to come to the ER. Reports back pain which is her normal sickle cell pain. States she has been taking all her meds without any relief.

## 2023-12-28 NOTE — Discharge Instructions (Signed)
 Follow up in the clinic tomorrow.

## 2023-12-28 NOTE — ED Notes (Signed)
 Applied heat packs to lower back for throbbing pain.

## 2023-12-28 NOTE — ED Provider Notes (Signed)
 [place] EMERGENCY DEPARTMENT AT [facility] Provider Note   CSN: [id number] Arrival date & time: 12/28/23  0725     History  Chief Complaint  Patient presents with   Sickle Cell Pain Crisis    Carmen [name] is a 33 y.o. female.  33 yo F with a chief complaints of sickle cell pain crisis.  Feels similar to what she has had previously.  She actually was seen in the sickle cell clinic yesterday and they had told her to take her medicines as prescribed.  She felt like things got a bit worse overnight and came here this morning.  Denies chest pain denies fevers denies cough or congestion.  She thinks that maybe she just undertreated her problem and that is why its gotten worse for her.   Sickle Cell Pain Crisis      Home Medications Prior to Admission medications   Medication Sig Start Date End Date Taking? Authorizing Provider  BENADRYL ALLERGY 25 MG capsule Take 25 mg by mouth every 6 (six) hours as needed for allergies.    [provider]  cyclobenzaprine (FLEXERIL) 10 MG tablet Take 1 tablet (10 mg total) by mouth at bedtime. TAKE 1 TABLET BY MOUTH DAILY AS NEEDED FOR MUSCLE SPASMS. Patient taking differently: Take 10 mg by mouth daily as needed for muscle spasms. 06/14/23   Massie Maroon, FNP  folic acid (FOLVITE) 1 MG tablet Take 1 tablet (1 mg total) by mouth daily. 12/27/23   Paseda, Baird Kay, FNP  hydroxyurea (HYDREA) 500 MG capsule Take 1 capsule (500 mg total) by mouth daily. 12/27/23   Donell Beers, FNP  morphine (MS CONTIN) 60 MG 12 hr tablet Take 1 tablet (60 mg total) by mouth every 12 (twelve) hours. 11/30/23 12/30/23  Quentin Angst, MD  nicotine (NICODERM CQ - DOSED IN MG/24 HOURS) 14 mg/24hr patch Place 1 patch (14 mg total) onto the skin daily. 12/27/23 02/07/24  Donell Beers, FNP  nicotine (NICODERM CQ - DOSED IN MG/24 HR) 7 mg/24hr patch Place 1 patch (7 mg total) onto the skin daily. 02/08/24   Donell Beers, FNP   Oxycodone HCl 10 MG TABS Take 1 tablet (10 mg total) by mouth every 6 (six) hours as needed (pain). 11/30/23   Quentin Angst, MD  Vitamin D, Ergocalciferol, (DRISDOL) 1.25 MG (50000 UNIT) CAPS capsule TAKE 1 CAPSULE BY MOUTH ONE TIME PER WEEK 12/27/23   Donell Beers, FNP      Allergies    Ketamine    Review of Systems   Review of Systems  Physical Exam Updated Vital Signs BP 112/78   Pulse (!) 124   Temp 98.6 F (37 C) (Oral)   Resp 14   Ht 5\' 3"  (1.6 m)   Wt 84.4 kg   SpO2 96%   BMI 32.96 kg/m  Physical Exam Vitals and nursing note reviewed.  Constitutional:      General: She is not in acute distress.    Appearance: She is well-developed. She is not diaphoretic.  HENT:     Head: Normocephalic and atraumatic.  Eyes:     Pupils: Pupils are equal, round, and reactive to light.  Cardiovascular:     Rate and Rhythm: Normal rate and regular rhythm.     Heart sounds: No murmur heard.    No friction rub. No gallop.  Pulmonary:     Effort: Pulmonary effort is normal.     Breath sounds: No  wheezing or rales.  Abdominal:     General: There is no distension.     Palpations: Abdomen is soft.     Tenderness: There is no abdominal tenderness.  Musculoskeletal:        General: No tenderness.     Cervical back: Normal range of motion and neck supple.  Skin:    General: Skin is warm and dry.  Neurological:     Mental Status: She is alert and oriented to person, place, and time.  Psychiatric:        Behavior: Behavior normal.     ED Results / Procedures / Treatments   Labs (all labs ordered are listed, but only abnormal results are displayed) Labs Reviewed  COMPREHENSIVE METABOLIC PANEL - Abnormal; Notable for the following components:      Result Value   CO2 21 (*)    Total Bilirubin 1.7 (*)    All other components within normal limits  RETICULOCYTES - Abnormal; Notable for the following components:   Retic Ct Pct 5.4 (*)    RBC. 3.40 (*)    Immature  Retic Fract 37.6 (*)    All other components within normal limits  CBC WITH DIFFERENTIAL/PLATELET - Abnormal; Notable for the following components:   RBC 3.38 (*)    Hemoglobin 10.1 (*)    HCT 28.2 (*)    Platelets 518 (*)    nRBC 0.3 (*)    Monocytes Absolute 1.2 (*)    All other components within normal limits  HCG, QUANTITATIVE, PREGNANCY    EKG EKG Interpretation Date/Time:  Wednesday December 28 2023 08:13:05 EST Ventricular Rate:  87 PR Interval:  158 QRS Duration:  82 QT Interval:  361 QTC Calculation: 435 R Axis:   64  Text Interpretation: Sinus rhythm RSR' in V1 or V2, probably normal variant Baseline wander in lead(s) II III aVF No significant change since last tracing Confirmed by Melene Plan [phone]) on 12/28/2023 8:27:52 AM  Radiology No results found.  Procedures Procedures    Medications Ordered in ED Medications  diphenhydrAMINE (BENADRYL) capsule 25-50 mg (25 mg Oral Given 12/28/23 0838)  ondansetron (ZOFRAN) injection 4 mg (has no administration in time range)  HYDROmorphone (DILAUDID) injection 2 mg (2 mg Intravenous Given 12/28/23 0835)  HYDROmorphone (DILAUDID) injection 2 mg (2 mg Intravenous Given 12/28/23 0921)  HYDROmorphone (DILAUDID) injection 2 mg (2 mg Intravenous Given 12/28/23 1006)  oxyCODONE (Oxy IR/ROXICODONE) immediate release tablet 15 mg (15 mg Oral Given 12/28/23 1104)  ketorolac (TORADOL) 15 MG/ML injection 15 mg (15 mg Intravenous Given 12/28/23 1104)    ED Course/ Medical Decision Making/ A&P                                 Medical Decision Making Amount and/or Complexity of Data Reviewed Labs: ordered.  Risk Prescription drug management.   33 yo F with a cc of sickle cell pain crisis.  Feels like typical pain.  Tried her home meds without improvement. She thinks due to decreased pain medicine use to try and keep her mental status of both in care of her 42-year-old.  No new areas of pain.  No fevers no chest pain.  Will treat  pain aggressively here.  Reassess.  Patient had 3 doses of pain meds.  Continuing to having ongoing severe pain.  Will discuss with sickle cell for admission.   I discussed with the sickle cell provider on-call Onyeje  Purvis Sheffield, they reviewed the case with Dr. Hyman Hopes, recommended follow-up in the clinic tomorrow.  1:01 PM:  I have discussed the diagnosis/risks/treatment options with the patient.  Evaluation and diagnostic testing in the emergency department does not suggest an emergent condition requiring admission or immediate intervention beyond what has been performed at this time.  They will follow up with Sickle cell. We also discussed returning to the ED immediately if new or worsening sx occur. We discussed the sx which are most concerning (e.g., sudden worsening pain, fever, inability to tolerate by mouth) that necessitate immediate return. Medications administered to the patient during their visit and any new prescriptions provided to the patient are listed below.  Medications given during this visit Medications  diphenhydrAMINE (BENADRYL) capsule 25-50 mg (25 mg Oral Given 12/28/23 0838)  ondansetron (ZOFRAN) injection 4 mg (has no administration in time range)  HYDROmorphone (DILAUDID) injection 2 mg (2 mg Intravenous Given 12/28/23 0835)  HYDROmorphone (DILAUDID) injection 2 mg (2 mg Intravenous Given 12/28/23 0921)  HYDROmorphone (DILAUDID) injection 2 mg (2 mg Intravenous Given 12/28/23 1006)  oxyCODONE (Oxy IR/ROXICODONE) immediate release tablet 15 mg (15 mg Oral Given 12/28/23 1104)  ketorolac (TORADOL) 15 MG/ML injection 15 mg (15 mg Intravenous Given 12/28/23 1104)     The patient appears reasonably screen and/or stabilized for discharge and I doubt any other medical condition or other [facility] requiring further screening, evaluation, or treatment in the ED at this time prior to discharge.          Final Clinical Impression(s) / ED Diagnoses Final diagnoses:  Sickle cell pain  crisis [facility])    Rx / DC Orders ED Discharge Orders     None         Melene Plan, DO 12/28/23 1301

## 2023-12-28 NOTE — Progress Notes (Unsigned)
 Reviewed PDMP substance reporting system prior to prescribing opiate medications. No inconsistencies noted.   1. 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  Dispense: 60 tablet; Refill: 0  2. Chronic prescription opiate use  - morphine (MS CONTIN) 60 MG 12 hr tablet; Take 1 tablet (60 mg total) by mouth every 12 (twelve) hours.  Dispense: 60 tablet; Refill: 0

## 2023-12-30 LAB — OPIATE CLASS, MS, UR RFX
Codeine: NOT DETECTED ng/mg{creat}
Dihydrocodeine: NOT DETECTED ng/mg{creat}
Hydrocodone: NOT DETECTED ng/mg{creat}
Hydromorphone: 92 ng/mg{creat}
Morphine: 1657 ng/mg{creat}
Norcodeine: NOT DETECTED ng/mg{creat}
Norhydrocodone: NOT DETECTED ng/mg{creat}
Normorphine: 170 ng/mg{creat}
Opiate Class Confirmation: POSITIVE

## 2023-12-30 LAB — TOXASSURE FLEX 15, UR
6-ACETYLMORPHINE IA: NEGATIVE ng/mL
AMPHETAMINES IA: NEGATIVE ng/mL
BARBITURATES IA: NEGATIVE ng/mL
COCAINE METABOLITE IA: NEGATIVE ng/mL
Creatinine: 101 mg/dL
ETHYL ALCOHOL Enzymatic: NEGATIVE g/dL
METHADONE IA: NEGATIVE ng/mL
METHADONE MTB IA: NEGATIVE ng/mL
PHENCYCLIDINE IA: NEGATIVE ng/mL
TAPENTADOL, IA: NEGATIVE ng/mL
TRAMADOL IA: NEGATIVE ng/mL

## 2023-12-30 LAB — OXYCODONE CLASS, MS, UR RFX
Noroxycodone: >9901 ng/mg{creat}
Noroxymorphone: 1235 ng/mg{creat}
Oxycodone Class Confirmation: POSITIVE
Oxycodone: 5265 ng/mg{creat}
Oxymorphone: 4422 ng/mg{creat}

## 2023-12-30 LAB — CANNABINOIDS, MS, UR RFX
Cannabinoids Confirmation: POSITIVE
Carboxy-THC: 563 ng/mg{creat}

## 2024-01-02 ENCOUNTER — Telehealth: Payer: Self-pay

## 2024-01-02 NOTE — Transitions of Care (Post Inpatient/ED Visit) (Signed)
   01/02/2024  Name: Carmen [name] MRN: [id number] DOB: 23-Nov-1990  Today's TOC FU Call Status: Today's TOC FU Call Status:: Unsuccessful Call (1st Attempt) Unsuccessful Call (1st Attempt) Date: 01/02/24  Attempted to reach the patient regarding the most recent Inpatient/ED visit.  Follow Up Plan: Additional outreach attempts will be made to reach the patient to complete the Transitions of Care (Post Inpatient/ED visit) call.       Antionette Fairy, RN,BSN, CCM [facility]  VBCI-Population Health Manager Population Health Direct Dial: [phone]

## 2024-01-03 ENCOUNTER — Telehealth: Payer: Self-pay | Admitting: *Deleted

## 2024-01-03 NOTE — Transitions of Care (Post Inpatient/ED Visit) (Signed)
   01/03/2024  Name: Carmen [name] MRN: [id number] DOB: 10-Mar-1991  Today's TOC FU Call Status: Today's TOC FU Call Status:: Unsuccessful Call (2nd Attempt) Unsuccessful Call (2nd Attempt) Date: 01/03/24  Attempted to reach the patient regarding the most recent Inpatient/ED visit.  Follow Up Plan: Additional outreach attempts will be made to reach the patient to complete the Transitions of Care (Post Inpatient/ED visit) call.   Gean Maidens BSN RN [facility] [facility] Care Management Coordinator [email or web address] Direct Dial: [phone]  Fax: [phone] Website: [email or web address]

## 2024-01-04 ENCOUNTER — Telehealth: Payer: Self-pay | Admitting: *Deleted

## 2024-01-04 ENCOUNTER — Other Ambulatory Visit: Payer: Self-pay | Admitting: Nurse Practitioner

## 2024-01-04 ENCOUNTER — Other Ambulatory Visit: Payer: Self-pay | Admitting: Internal Medicine

## 2024-01-04 DIAGNOSIS — Z79891 Long term (current) use of opiate analgesic: Secondary | ICD-10-CM

## 2024-01-04 DIAGNOSIS — D571 Sickle-cell disease without crisis: Secondary | ICD-10-CM

## 2024-01-04 MED ORDER — OXYCODONE HCL 10 MG PO TABS
10.0000 mg | ORAL_TABLET | Freq: Four times a day (QID) | ORAL | 0 refills | Status: DC | PRN
Start: 2024-01-04 — End: 2024-01-23

## 2024-01-04 NOTE — Telephone Encounter (Signed)
 Please advise [facility]

## 2024-01-04 NOTE — Transitions of Care (Post Inpatient/ED Visit) (Signed)
   01/04/2024  Name: LEYANNA BITTMAN MRN: [id number] DOB: [birth year]  Today's TOC FU Call Status: Today's TOC FU Call Status:: Unsuccessful Call (3rd Attempt) Unsuccessful Call (3rd Attempt) Date: 01/04/24  Attempted to reach the patient regarding the most recent Inpatient/ED visit.  Follow Up Plan: No further outreach attempts will be made at this time. We have been unable to contact the patient.  Gean Maidens BSN RN [facility] Health Care Management Coordinator [email or web address] Direct Dial: [phone]  Fax: [phone] Website: [email or web address]

## 2024-01-04 NOTE — Progress Notes (Unsigned)
 Reviewed PDMP substance reporting system prior to prescribing opiate medications. No inconsistencies noted.     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  Dispense: 60 tablet; Refill: 0  2. Chronic prescription opiate use  - Oxycodone HCl 10 MG TABS; Take 1 tablet (10 mg total) by mouth every 6 (six) hours as needed (pain).  Dispense: 60 tablet; Refill: 0

## 2024-01-16 ENCOUNTER — Other Ambulatory Visit: Payer: Self-pay | Admitting: Family Medicine

## 2024-01-16 ENCOUNTER — Other Ambulatory Visit: Payer: Self-pay | Admitting: Nurse Practitioner

## 2024-01-16 DIAGNOSIS — G8929 Other chronic pain: Secondary | ICD-10-CM

## 2024-01-16 MED ORDER — CYCLOBENZAPRINE HCL 10 MG PO TABS: 10.0000 mg | ORAL_TABLET | Freq: Every day | ORAL | 2 refills | Status: DC

## 2024-01-16 NOTE — Telephone Encounter (Signed)
 Please advise [facility]

## 2024-01-23 ENCOUNTER — Telehealth: Payer: Self-pay

## 2024-01-23 ENCOUNTER — Other Ambulatory Visit: Payer: Self-pay | Admitting: Nurse Practitioner

## 2024-01-23 ENCOUNTER — Other Ambulatory Visit: Payer: Self-pay

## 2024-01-23 DIAGNOSIS — D571 Sickle-cell disease without crisis: Secondary | ICD-10-CM

## 2024-01-23 DIAGNOSIS — Z79891 Long term (current) use of opiate analgesic: Secondary | ICD-10-CM

## 2024-01-23 MED ORDER — OXYCODONE HCL 10 MG PO TABS
10.0000 mg | ORAL_TABLET | Freq: Four times a day (QID) | ORAL | 0 refills | Status: DC | PRN
Start: 2024-01-23 — End: 2024-02-09

## 2024-01-23 NOTE — Progress Notes (Unsigned)
 Reviewed PDMP substance reporting system prior to prescribing opiate medications. No inconsistencies noted.     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  Dispense: 60 tablet; Refill: 0  2. Chronic prescription opiate use  - Oxycodone HCl 10 MG TABS; Take 1 tablet (10 mg total) by mouth every 6 (six) hours as needed (pain).  Dispense: 60 tablet; Refill: 0

## 2024-01-23 NOTE — Telephone Encounter (Signed)
 Pharmacy Patient Advocate Encounter   Received notification from Pt Calls Messages that prior authorization for OXYCODONE IR is required/requested.   Insurance verification completed.   The patient is insured through [employer] .   Per test claim: PA required; PA submitted to above mentioned insurance via CoverMyMeds Key/confirmation #/EOC [id number] Status is pending  SUBMITTED AS EXPEDITED REQUEST

## 2024-01-23 NOTE — Telephone Encounter (Unsigned)
 Copied from CRM [id number]. Topic: Clinical - Prescription Issue >> Jan 23, 2024 11:59 AM Carmen [name] wrote: Reason for CRM: patient called stating she need a prior authorization for Oxycodone HCl 10 MG TABS because the other authorization expired

## 2024-01-24 ENCOUNTER — Other Ambulatory Visit: Payer: Self-pay

## 2024-01-27 ENCOUNTER — Other Ambulatory Visit: Payer: Self-pay | Admitting: Nurse Practitioner

## 2024-01-27 DIAGNOSIS — Z79891 Long term (current) use of opiate analgesic: Secondary | ICD-10-CM

## 2024-01-27 DIAGNOSIS — D571 Sickle-cell disease without crisis: Secondary | ICD-10-CM

## 2024-01-27 MED ORDER — MORPHINE SULFATE ER 60 MG PO TBCR: 60.0000 mg | EXTENDED_RELEASE_TABLET | Freq: Two times a day (BID) | ORAL | 0 refills | Status: DC

## 2024-01-29 ENCOUNTER — Inpatient Hospital Stay (HOSPITAL_COMMUNITY)
Admission: EM | Admit: 2024-01-29 | Discharge: 2024-01-31 | DRG: 812 | Disposition: A | Discharge status: Home / Self Care | Payer: MEDICAID | Attending: Internal Medicine | Admitting: Internal Medicine

## 2024-01-29 ENCOUNTER — Encounter (HOSPITAL_COMMUNITY): Payer: Self-pay | Admitting: Emergency Medicine

## 2024-01-29 ENCOUNTER — Other Ambulatory Visit: Payer: Self-pay

## 2024-01-29 ENCOUNTER — Inpatient Hospital Stay (HOSPITAL_COMMUNITY): Payer: MEDICAID

## 2024-01-29 DIAGNOSIS — D638 Anemia in other chronic diseases classified elsewhere: Secondary | ICD-10-CM | POA: Diagnosis present

## 2024-01-29 DIAGNOSIS — G894 Chronic pain syndrome: Secondary | ICD-10-CM | POA: Diagnosis present

## 2024-01-29 DIAGNOSIS — M25551 Pain in right hip: Secondary | ICD-10-CM | POA: Diagnosis present

## 2024-01-29 DIAGNOSIS — Z6833 Body mass index (BMI) 33.0-33.9, adult: Secondary | ICD-10-CM | POA: Diagnosis not present

## 2024-01-29 DIAGNOSIS — Z9049 Acquired absence of other specified parts of digestive tract: Secondary | ICD-10-CM

## 2024-01-29 DIAGNOSIS — M25552 Pain in left hip: Secondary | ICD-10-CM | POA: Diagnosis present

## 2024-01-29 DIAGNOSIS — Z885 Allergy status to narcotic agent status: Secondary | ICD-10-CM

## 2024-01-29 DIAGNOSIS — Z79899 Other long term (current) drug therapy: Secondary | ICD-10-CM

## 2024-01-29 DIAGNOSIS — F329 Major depressive disorder, single episode, unspecified: Secondary | ICD-10-CM | POA: Diagnosis present

## 2024-01-29 DIAGNOSIS — F17210 Nicotine dependence, cigarettes, uncomplicated: Secondary | ICD-10-CM | POA: Diagnosis present

## 2024-01-29 DIAGNOSIS — E66811 Obesity, class 1: Secondary | ICD-10-CM | POA: Diagnosis present

## 2024-01-29 DIAGNOSIS — Z832 Family history of diseases of the blood and blood-forming organs and certain disorders involving the immune mechanism: Secondary | ICD-10-CM | POA: Diagnosis not present

## 2024-01-29 DIAGNOSIS — Z9081 Acquired absence of spleen: Secondary | ICD-10-CM

## 2024-01-29 DIAGNOSIS — D5700 Hb-SS disease with crisis, unspecified: Principal | ICD-10-CM | POA: Diagnosis present

## 2024-01-29 DIAGNOSIS — Z1152 Encounter for screening for COVID-19: Secondary | ICD-10-CM

## 2024-01-29 LAB — CBC WITH DIFFERENTIAL/PLATELET
Abs Immature Granulocytes: 0.07 10*3/uL (ref 0.00–0.07)
Basophils Absolute: 0 10*3/uL (ref 0.0–0.1)
Basophils Relative: 0 %
Eosinophils Absolute: 0.1 10*3/uL (ref 0.0–0.5)
Eosinophils Relative: 1 %
HCT: 25 % — ABNORMAL LOW (ref 36.0–46.0)
Hemoglobin: 9.1 g/dL — ABNORMAL LOW (ref 12.0–15.0)
Immature Granulocytes: 1 %
Lymphocytes Relative: 41 %
Lymphs Abs: 2.9 10*3/uL (ref 0.7–4.0)
MCH: 30.1 pg (ref 26.0–34.0)
MCHC: 36.4 g/dL — ABNORMAL HIGH (ref 30.0–36.0)
MCV: 82.8 fL (ref 80.0–100.0)
Monocytes Absolute: 0.7 10*3/uL (ref 0.1–1.0)
Monocytes Relative: 10 %
Neutro Abs: 3.3 10*3/uL (ref 1.7–7.7)
Neutrophils Relative %: 47 %
Platelets: 629 10*3/uL — ABNORMAL HIGH (ref 150–400)
RBC: 3.02 MIL/uL — ABNORMAL LOW (ref 3.87–5.11)
RDW: 14.5 % (ref 11.5–15.5)
WBC: 7.1 10*3/uL (ref 4.0–10.5)
nRBC: 0.8 % — ABNORMAL HIGH (ref 0.0–0.2)

## 2024-01-29 LAB — COMPREHENSIVE METABOLIC PANEL WITH GFR
ALT: 20 U/L (ref 0–44)
AST: 32 U/L (ref 15–41)
Albumin: 3.7 g/dL (ref 3.5–5.0)
Alkaline Phosphatase: 51 U/L (ref 38–126)
Anion gap: 9 (ref 5–15)
BUN: 6 mg/dL (ref 6–20)
CO2: 20 mmol/L — ABNORMAL LOW (ref 22–32)
Calcium: 8.9 mg/dL (ref 8.9–10.3)
Chloride: 109 mmol/L (ref 98–111)
Creatinine, Ser: 0.56 mg/dL (ref 0.44–1.00)
GFR, Estimated: >60 mL/min
Glucose, Bld: 91 mg/dL (ref 70–99)
Potassium: 4.3 mmol/L (ref 3.5–5.1)
Sodium: 138 mmol/L (ref 135–145)
Total Bilirubin: 0.9 mg/dL (ref 0.0–1.2)
Total Protein: 7.2 g/dL (ref 6.5–8.1)

## 2024-01-29 LAB — RETICULOCYTES
Immature Retic Fract: 41.3 % — ABNORMAL HIGH (ref 2.3–15.9)
RBC.: 2.98 MIL/uL — ABNORMAL LOW (ref 3.87–5.11)
Retic Count, Absolute: 221.4 10*3/uL — ABNORMAL HIGH (ref 19.0–186.0)
Retic Ct Pct: 7.4 % — ABNORMAL HIGH (ref 0.4–3.1)

## 2024-01-29 LAB — HCG, SERUM, QUALITATIVE: Preg, Serum: NEGATIVE

## 2024-01-29 MED ORDER — HYDROMORPHONE HCL 1 MG/ML IJ SOLN
2.0000 mg | INTRAMUSCULAR | Status: AC
  Administered 2024-01-29: 2 mg via INTRAVENOUS
  Filled 2024-01-29: qty 2

## 2024-01-29 MED ORDER — MELATONIN 3 MG PO TABS: 6.0000 mg | ORAL_TABLET | Freq: Every evening | ORAL | Status: DC | PRN

## 2024-01-29 MED ORDER — MORPHINE SULFATE ER 15 MG PO TBCR
60.0000 mg | EXTENDED_RELEASE_TABLET | Freq: Two times a day (BID) | ORAL | Status: DC
  Administered 2024-01-29 – 2024-01-31 (×4): 60 mg via ORAL
  Filled 2024-01-29 (×4): qty 4

## 2024-01-29 MED ORDER — SODIUM CHLORIDE 0.9% FLUSH
3.0000 mL | Freq: Two times a day (BID) | INTRAVENOUS | Status: DC
  Administered 2024-01-30 – 2024-01-31 (×2): 3 mL via INTRAVENOUS

## 2024-01-29 MED ORDER — ONDANSETRON HCL 4 MG/2ML IJ SOLN
4.0000 mg | Freq: Four times a day (QID) | INTRAMUSCULAR | Status: DC | PRN
  Administered 2024-01-30 (×2): 4 mg via INTRAVENOUS
  Filled 2024-01-29 (×2): qty 2

## 2024-01-29 MED ORDER — HYDROMORPHONE HCL 1 MG/ML IJ SOLN: 1.0000 mg | INTRAMUSCULAR | Status: DC | PRN

## 2024-01-29 MED ORDER — HYDROXYUREA 500 MG PO CAPS
500.0000 mg | ORAL_CAPSULE | Freq: Every day | ORAL | Status: DC
  Filled 2024-01-29 (×2): qty 1

## 2024-01-29 MED ORDER — KETOROLAC TROMETHAMINE 30 MG/ML IJ SOLN
30.0000 mg | Freq: Four times a day (QID) | INTRAMUSCULAR | Status: DC
Start: 2024-01-30 — End: 2024-01-31
  Administered 2024-01-30 (×2): 30 mg via INTRAVENOUS
  Filled 2024-01-29 (×2): qty 1

## 2024-01-29 MED ORDER — OXYCODONE HCL 5 MG PO TABS: 10.0000 mg | ORAL_TABLET | Freq: Four times a day (QID) | ORAL | Status: DC | PRN

## 2024-01-29 MED ORDER — POLYETHYLENE GLYCOL 3350 17 G PO PACK
17.0000 g | PACK | Freq: Every day | ORAL | Status: DC | PRN
Start: 2024-01-29 — End: 2024-01-31

## 2024-01-29 MED ORDER — ONDANSETRON HCL 4 MG/2ML IJ SOLN: 4.0000 mg | INTRAMUSCULAR | Status: DC | PRN

## 2024-01-29 MED ORDER — SODIUM CHLORIDE 0.9% FLUSH: 9.0000 mL | INTRAVENOUS | Status: DC | PRN

## 2024-01-29 MED ORDER — HYDROMORPHONE HCL 1 MG/ML IJ SOLN
2.0000 mg | INTRAMUSCULAR | Status: AC
  Administered 2024-01-29: 2 mg via INTRAVENOUS

## 2024-01-29 MED ORDER — DIPHENHYDRAMINE HCL 25 MG PO CAPS: 25.0000 mg | ORAL_CAPSULE | ORAL | Status: DC | PRN

## 2024-01-29 MED ORDER — HYDROMORPHONE HCL 1 MG/ML IJ SOLN
INTRAMUSCULAR | Status: AC
  Filled 2024-01-29: qty 1

## 2024-01-29 MED ORDER — HYDROMORPHONE 1 MG/ML IV SOLN
INTRAVENOUS | Status: DC
  Administered 2024-01-29: 30 mg via INTRAVENOUS
  Administered 2024-01-30: 1.6 mg via INTRAVENOUS
  Filled 2024-01-29: qty 30

## 2024-01-29 MED ORDER — OXYCODONE HCL 5 MG PO TABS: 5.0000 mg | ORAL_TABLET | Freq: Four times a day (QID) | ORAL | Status: DC | PRN

## 2024-01-29 MED ORDER — ALBUTEROL SULFATE (2.5 MG/3ML) 0.083% IN NEBU: 2.5000 mg | INHALATION_SOLUTION | RESPIRATORY_TRACT | Status: DC | PRN

## 2024-01-29 MED ORDER — LIDOCAINE 5 % EX PTCH
2.0000 | MEDICATED_PATCH | CUTANEOUS | Status: DC
  Filled 2024-01-29: qty 2

## 2024-01-29 MED ORDER — ENOXAPARIN SODIUM 40 MG/0.4ML IJ SOSY
40.0000 mg | PREFILLED_SYRINGE | INTRAMUSCULAR | Status: DC
  Filled 2024-01-29 (×2): qty 0.4

## 2024-01-29 MED ORDER — ACETAMINOPHEN 500 MG PO TABS: 1000.0000 mg | ORAL_TABLET | Freq: Four times a day (QID) | ORAL | Status: DC | PRN

## 2024-01-29 MED ORDER — HYDROMORPHONE HCL 1 MG/ML IJ SOLN: 1.0000 mg | Freq: Once | INTRAMUSCULAR | Status: DC

## 2024-01-29 MED ORDER — OXYCODONE HCL 5 MG PO TABS
10.0000 mg | ORAL_TABLET | Freq: Four times a day (QID) | ORAL | Status: DC
  Administered 2024-01-29 – 2024-01-31 (×7): 10 mg via ORAL
  Filled 2024-01-29 (×7): qty 2

## 2024-01-29 MED ORDER — NALOXONE HCL 0.4 MG/ML IJ SOLN: 0.4000 mg | INTRAMUSCULAR | Status: DC | PRN

## 2024-01-29 MED ORDER — FOLIC ACID 1 MG PO TABS
1.0000 mg | ORAL_TABLET | Freq: Every day | ORAL | Status: DC
  Administered 2024-01-30 – 2024-01-31 (×2): 1 mg via ORAL
  Filled 2024-01-29 (×2): qty 1

## 2024-01-29 MED ORDER — KETOROLAC TROMETHAMINE 15 MG/ML IJ SOLN
15.0000 mg | Freq: Once | INTRAMUSCULAR | Status: AC
  Administered 2024-01-29: 15 mg via INTRAVENOUS
  Filled 2024-01-29: qty 1

## 2024-01-29 MED ORDER — CYCLOBENZAPRINE HCL 10 MG PO TABS
10.0000 mg | ORAL_TABLET | Freq: Every day | ORAL | Status: DC
  Administered 2024-01-29 – 2024-01-30 (×2): 10 mg via ORAL
  Filled 2024-01-29 (×2): qty 1

## 2024-01-29 NOTE — ED Provider Notes (Signed)
 [place] EMERGENCY DEPARTMENT AT [facility] Provider Note   CSN: [id number] Arrival date & time: 01/29/24  1443     History  Chief Complaint  Patient presents with   Sickle Cell Pain Crisis    Carmen [name] is a 33 y.o. female.  33 yo F with a chief complaints of sickle cell pain crisis.  The patient said that she just had her birthday she went to the beach over the weekend and was feeling some pain but felt he got much worse this morning when she woke up.  No new areas of pain.  Feels like her prior sickle cell pain.  Denies chest pain denies cough denies fever.  Feels like she is been eating and drinking normally.  Has been taking her medications as prescribed but just has not had any significant treatment   Sickle Cell Pain Crisis      Home Medications Prior to Admission medications   Medication Sig Start Date End Date Taking? Authorizing Provider  BENADRYL ALLERGY 25 MG capsule Take 25 mg by mouth every 6 (six) hours as needed for allergies.   Yes [provider]  cyclobenzaprine (FLEXERIL) 10 MG tablet Take 1 tablet (10 mg total) by mouth at bedtime. TAKE 1 TABLET BY MOUTH DAILY AS NEEDED FOR MUSCLE SPASMS. 01/16/24  Yes Paseda, Baird Kay, FNP  folic acid (FOLVITE) 1 MG tablet Take 1 tablet (1 mg total) by mouth daily. 12/27/23  Yes Paseda, Baird Kay, FNP  morphine (MS CONTIN) 60 MG 12 hr tablet Take 1 tablet (60 mg total) by mouth every 12 (twelve) hours. 01/29/24 02/28/24 Yes Paseda, Baird Kay, FNP  Oxycodone HCl 10 MG TABS Take 1 tablet (10 mg total) by mouth every 6 (six) hours as needed (pain). 01/23/24  Yes Paseda, Baird Kay, FNP  Vitamin D, Ergocalciferol, (DRISDOL) 1.25 MG (50000 UNIT) CAPS capsule TAKE 1 CAPSULE BY MOUTH ONE TIME PER WEEK 12/27/23  Yes Paseda, Baird Kay, FNP  hydroxyurea (HYDREA) 500 MG capsule Take 1 capsule (500 mg total) by mouth daily. Patient not taking: Reported on 01/29/2024 12/27/23   Donell Beers, FNP   nicotine (NICODERM CQ - DOSED IN MG/24 HOURS) 14 mg/24hr patch Place 1 patch (14 mg total) onto the skin daily. Patient not taking: Reported on 01/29/2024 12/27/23 02/07/24  Donell Beers, FNP  nicotine (NICODERM CQ - DOSED IN MG/24 HR) 7 mg/24hr patch Place 1 patch (7 mg total) onto the skin daily. Patient not taking: Reported on 01/29/2024 02/08/24   Donell Beers, FNP      Allergies    Ketamine    Review of Systems   Review of Systems  Physical Exam Updated Vital Signs BP 118/80 (BP Location: Right Arm)   Pulse 67   Temp 98 F (36.7 C) (Oral)   Resp 20   Wt 85 kg   SpO2 99%   BMI 33.19 kg/m  Physical Exam Vitals and nursing note reviewed.  Constitutional:      General: She is not in acute distress.    Appearance: She is well-developed. She is not diaphoretic.  HENT:     Head: Normocephalic and atraumatic.  Eyes:     Pupils: Pupils are equal, round, and reactive to light.  Cardiovascular:     Rate and Rhythm: Normal rate and regular rhythm.     Heart sounds: No murmur heard.    No friction rub. No gallop.  Pulmonary:     Effort: Pulmonary effort is  normal.     Breath sounds: No wheezing or rales.  Abdominal:     General: There is no distension.     Palpations: Abdomen is soft.     Tenderness: There is no abdominal tenderness.  Musculoskeletal:        General: No tenderness.     Cervical back: Normal range of motion and neck supple.  Skin:    General: Skin is warm and dry.  Neurological:     Mental Status: She is alert and oriented to person, place, and time.  Psychiatric:        Behavior: Behavior normal.     ED Results / Procedures / Treatments   Labs (all labs ordered are listed, but only abnormal results are displayed) Labs Reviewed  COMPREHENSIVE METABOLIC PANEL WITH GFR - Abnormal; Notable for the following components:      Result Value   CO2 20 (*)    All other components within normal limits  CBC WITH DIFFERENTIAL/PLATELET - Abnormal;  Notable for the following components:   RBC 3.02 (*)    Hemoglobin 9.1 (*)    HCT 25.0 (*)    MCHC 36.4 (*)    Platelets 629 (*)    nRBC 0.8 (*)    All other components within normal limits  RETICULOCYTES - Abnormal; Notable for the following components:   Retic Ct Pct 7.4 (*)    RBC. 2.98 (*)    Retic Count, Absolute 221.4 (*)    Immature Retic Fract 41.3 (*)    All other components within normal limits  HCG, SERUM, QUALITATIVE  BASIC METABOLIC PANEL WITH GFR  CBC  MAGNESIUM  PHOSPHORUS    EKG None  Radiology DG CHEST PORT 1 VIEW Result Date: 01/29/2024 CLINICAL DATA:  [id number] Vasoocclusive sickle cell crisis (HCC) [id number] EXAM: PORTABLE CHEST 1 VIEW COMPARISON:  May 22, 2023 FINDINGS: The cardiomediastinal silhouette is unchanged in contour. No pleural effusion. No pneumothorax. No acute pleuroparenchymal abnormality. IMPRESSION: No acute cardiopulmonary abnormality. Electronically Signed   By: Meda Klinefelter M.D.   On: 01/29/2024 21:35    Procedures .Critical Care  Performed by: Melene Plan, DO Authorized by: Melene Plan, DO   Critical care provider statement:    Critical care time (minutes):  35   Critical care time was exclusive of:  Separately billable procedures and treating other patients   Critical care was time spent personally by me on the following activities:  Development of treatment plan with patient or surrogate, discussions with consultants, evaluation of patient's response to treatment, examination of patient, ordering and review of laboratory studies, ordering and review of radiographic studies, ordering and performing treatments and interventions, pulse oximetry, re-evaluation of patient's condition and review of old charts   Care discussed with: admitting provider       Medications Ordered in ED Medications  diphenhydrAMINE (BENADRYL) capsule 25-50 mg (has no administration in time range)  enoxaparin (LOVENOX) injection 40 mg (has no administration  in time range)  sodium chloride flush (NS) 0.9 % injection 3 mL (has no administration in time range)  acetaminophen (TYLENOL) tablet 1,000 mg (has no administration in time range)  ketorolac (TORADOL) 30 MG/ML injection 30 mg (has no administration in time range)  melatonin tablet 6 mg (has no administration in time range)  ondansetron (ZOFRAN) injection 4 mg (has no administration in time range)  polyethylene glycol (MIRALAX / GLYCOLAX) packet 17 g (has no administration in time range)  cyclobenzaprine (FLEXERIL) tablet 10 mg (10 mg Oral  Given 01/29/24 2210)  folic acid (FOLVITE) tablet 1 mg (has no administration in time range)  hydroxyurea (HYDREA) capsule 500 mg (has no administration in time range)  morphine (MS CONTIN) 12 hr tablet 60 mg (60 mg Oral Given 01/29/24 2209)  oxyCODONE (Oxy IR/ROXICODONE) immediate release tablet 10 mg (10 mg Oral Given 01/29/24 2209)  oxyCODONE (Oxy IR/ROXICODONE) immediate release tablet 5 mg (has no administration in time range)    Or  oxyCODONE (Oxy IR/ROXICODONE) immediate release tablet 10 mg (has no administration in time range)  naloxone (NARCAN) injection 0.4 mg (has no administration in time range)    And  sodium chloride flush (NS) 0.9 % injection 9 mL (has no administration in time range)  HYDROmorphone (DILAUDID) 1 mg/mL PCA injection (30 mg Intravenous Set-up / Initial Syringe 01/29/24 2254)  albuterol (PROVENTIL) (2.5 MG/3ML) 0.083% nebulizer solution 2.5 mg (has no administration in time range)  lidocaine (LIDODERM) 5 % 2 patch (has no administration in time range)  HYDROmorphone (DILAUDID) injection 2 mg (2 mg Intravenous Given 01/29/24 1834)  HYDROmorphone (DILAUDID) injection 2 mg (2 mg Intravenous Given 01/29/24 1757)  HYDROmorphone (DILAUDID) injection 2 mg (2 mg Intravenous Given 01/29/24 1949)  ketorolac (TORADOL) 15 MG/ML injection 15 mg (15 mg Intravenous Given 01/29/24 2030)  HYDROmorphone (DILAUDID) injection 2 mg (2 mg Intravenous Given  01/29/24 2030)    ED Course/ Medical Decision Making/ A&P                                 Medical Decision Making Amount and/or Complexity of Data Reviewed Labs: ordered.  Risk Prescription drug management. Decision regarding hospitalization.   33 yo F with a chief complaints of a sickle cell pain crisis.  Going on for couple days.  Patient thinks it was due to the weather.  Typical of her sickle cell pain.  Mostly in her back.  No new areas of pain.  Will attempt to aggressively control her pain here.  Reassess.  Patient's labs appear to be a bit her baseline.  No obvious acute anemia.  No significant electrolyte abnormalities.  After 3 doses of IV narcotics patient with ongoing pain.  Will admit.  The patients results and plan were reviewed and discussed.   Any x-rays performed were independently reviewed by myself.   Differential diagnosis were considered with the presenting HPI.  Medications  diphenhydrAMINE (BENADRYL) capsule 25-50 mg (has no administration in time range)  enoxaparin (LOVENOX) injection 40 mg (has no administration in time range)  sodium chloride flush (NS) 0.9 % injection 3 mL (has no administration in time range)  acetaminophen (TYLENOL) tablet 1,000 mg (has no administration in time range)  ketorolac (TORADOL) 30 MG/ML injection 30 mg (has no administration in time range)  melatonin tablet 6 mg (has no administration in time range)  ondansetron (ZOFRAN) injection 4 mg (has no administration in time range)  polyethylene glycol (MIRALAX / GLYCOLAX) packet 17 g (has no administration in time range)  cyclobenzaprine (FLEXERIL) tablet 10 mg (10 mg Oral Given 01/29/24 2210)  folic acid (FOLVITE) tablet 1 mg (has no administration in time range)  hydroxyurea (HYDREA) capsule 500 mg (has no administration in time range)  morphine (MS CONTIN) 12 hr tablet 60 mg (60 mg Oral Given 01/29/24 2209)  oxyCODONE (Oxy IR/ROXICODONE) immediate release tablet 10 mg (10 mg  Oral Given 01/29/24 2209)  oxyCODONE (Oxy IR/ROXICODONE) immediate release tablet 5 mg (has no  administration in time range)    Or  oxyCODONE (Oxy IR/ROXICODONE) immediate release tablet 10 mg (has no administration in time range)  naloxone (NARCAN) injection 0.4 mg (has no administration in time range)    And  sodium chloride flush (NS) 0.9 % injection 9 mL (has no administration in time range)  HYDROmorphone (DILAUDID) 1 mg/mL PCA injection (30 mg Intravenous Set-up / Initial Syringe 01/29/24 2254)  albuterol (PROVENTIL) (2.5 MG/3ML) 0.083% nebulizer solution 2.5 mg (has no administration in time range)  lidocaine (LIDODERM) 5 % 2 patch (has no administration in time range)  HYDROmorphone (DILAUDID) injection 2 mg (2 mg Intravenous Given 01/29/24 1834)  HYDROmorphone (DILAUDID) injection 2 mg (2 mg Intravenous Given 01/29/24 1757)  HYDROmorphone (DILAUDID) injection 2 mg (2 mg Intravenous Given 01/29/24 1949)  ketorolac (TORADOL) 15 MG/ML injection 15 mg (15 mg Intravenous Given 01/29/24 2030)  HYDROmorphone (DILAUDID) injection 2 mg (2 mg Intravenous Given 01/29/24 2030)    Vitals:   01/29/24 2100 01/29/24 2115 01/29/24 2238 01/29/24 2254  BP: 122/89 116/89 118/80   Pulse: 67  67   Resp: 13 18 20 20   Temp:   98 F (36.7 C)   TempSrc:   Oral   SpO2: 100%  99%   Weight:        Final diagnoses:  Sickle cell pain crisis (HCC)    Admission/ observation were discussed with the admitting physician, patient and/or family and they are comfortable with the plan.          Final Clinical Impression(s) / ED Diagnoses Final diagnoses:  Sickle cell pain crisis [facility])    Rx / DC Orders ED Discharge Orders     None         Melene Plan, DO 01/29/24 2324

## 2024-01-29 NOTE — ED Notes (Signed)
 [facility] called for transport.

## 2024-01-29 NOTE — ED Triage Notes (Signed)
 Pt here from home with c/o Sickle cell pain , pt has taken all of her home meds with minimal relief

## 2024-01-29 NOTE — ED Notes (Signed)
 ED Provider at bedside.

## 2024-01-30 ENCOUNTER — Inpatient Hospital Stay (HOSPITAL_COMMUNITY): Payer: MEDICAID

## 2024-01-30 DIAGNOSIS — D5700 Hb-SS disease with crisis, unspecified: Secondary | ICD-10-CM | POA: Diagnosis not present

## 2024-01-30 LAB — CBC
HCT: 22.5 % — ABNORMAL LOW (ref 36.0–46.0)
Hemoglobin: 7.9 g/dL — ABNORMAL LOW (ref 12.0–15.0)
MCH: 29.7 pg (ref 26.0–34.0)
MCHC: 35.1 g/dL (ref 30.0–36.0)
MCV: 84.6 fL (ref 80.0–100.0)
Platelets: 524 10*3/uL — ABNORMAL HIGH (ref 150–400)
RBC: 2.66 MIL/uL — ABNORMAL LOW (ref 3.87–5.11)
RDW: 14.7 % (ref 11.5–15.5)
WBC: 9.8 10*3/uL (ref 4.0–10.5)
nRBC: 0.7 % — ABNORMAL HIGH (ref 0.0–0.2)

## 2024-01-30 LAB — BASIC METABOLIC PANEL WITH GFR
Anion gap: 7 (ref 5–15)
BUN: 7 mg/dL (ref 6–20)
CO2: 24 mmol/L (ref 22–32)
Calcium: 8.7 mg/dL — ABNORMAL LOW (ref 8.9–10.3)
Chloride: 107 mmol/L (ref 98–111)
Creatinine, Ser: 0.58 mg/dL (ref 0.44–1.00)
GFR, Estimated: >60 mL/min (ref >60)
Glucose, Bld: 100 mg/dL — ABNORMAL HIGH (ref 70–99)
Potassium: 3.5 mmol/L (ref 3.5–5.1)
Sodium: 138 mmol/L (ref 135–145)

## 2024-01-30 LAB — MAGNESIUM: Magnesium: 2.1 mg/dL (ref 1.7–2.4)

## 2024-01-30 LAB — PHOSPHORUS: Phosphorus: 3.3 mg/dL (ref 2.5–4.6)

## 2024-01-30 MED ORDER — SODIUM CHLORIDE 0.9% FLUSH: 10.0000 mL | INTRAVENOUS | Status: DC | PRN

## 2024-01-30 MED ORDER — KETOROLAC TROMETHAMINE 30 MG/ML IJ SOLN
15.0000 mg | Freq: Four times a day (QID) | INTRAMUSCULAR | Status: DC
  Administered 2024-01-30 – 2024-01-31 (×4): 15 mg via INTRAVENOUS
  Filled 2024-01-30 (×4): qty 1

## 2024-01-30 MED ORDER — HYDROMORPHONE 1 MG/ML IV SOLN
INTRAVENOUS | Status: DC
  Administered 2024-01-30: 4.2 mg via INTRAVENOUS
  Administered 2024-01-30: 6.2 mg via INTRAVENOUS
  Administered 2024-01-30: 4.2 mg via INTRAVENOUS
  Administered 2024-01-31: 30 mg via INTRAVENOUS
  Administered 2024-01-31: 3 mg via INTRAVENOUS
  Administered 2024-01-31: 6 mg via INTRAVENOUS
  Administered 2024-01-31: 4 mg via INTRAVENOUS
  Filled 2024-01-30: qty 30

## 2024-01-30 NOTE — Progress Notes (Signed)
   01/30/24 1101  Provider Notification  Provider Name/Title Lynnell Chad, NP  Date Provider Notified 01/30/24  Time Provider Notified 1143  Method of Notification Page (secure chat)  Notification Reason Red med refusal (lovenox and hydrea)  Provider response See new orders  Date of Provider Response 01/30/24  Time of Provider Response 1146   SCD's ordered

## 2024-01-30 NOTE — Progress Notes (Signed)
 Patient ID: Carmen [name], female   DOB: Jul 09, 1991, 33 y.o.   MRN: [id number] Subjective: Carmen [name]  is a 33 y.o. female with medical history significant for sickle cell disease, chronic pain syndrome, opiate dependence and tolerance, anemia of chronic disease and major depressive disorder, who presented to the emergency department with worsening mid back and hip pain consistent with sickle cell pain in the past.  The past week was vacationing at the beach for her birthday.  She was doing well in terms of her chronic pain however today patient woke and had worsening pain in her mid back and her hips.  She is still able to bear weight.  No numbness or weakness into her lower extremities.  Patient has been taking her home pain medication as prescribed but not getting relief of pain.  No chest pain or shortness of breath, recent fever or chills, cough or cold symptoms. Patient rates her pain today as 7 out of 10 with no new concerns.  Objective:  Vital signs in last 24 hours:  Vitals:   01/30/24 0407 01/30/24 0418 01/30/24 0836 01/30/24 0900  BP:  108/66  114/80  Pulse:  63  (!) 58  Resp: 20 14 16 16   Temp:  97.9 F (36.6 C)    TempSrc:  Oral    SpO2:  100%    Weight:      Height:        Intake/Output from previous day:  No intake or output data in the 24 hours ending 01/30/24 1113  Physical Exam: General: Alert, awake, oriented x3, in no acute distress.  HEENT: [place]/AT PEERL, EOMI Neck: Trachea midline,  no masses, no thyromegal,y no JVD, no carotid bruit OROPHARYNX:  Moist, No exudate/ erythema/lesions.  Heart: Regular rate and rhythm, without murmurs, rubs, gallops, PMI non-displaced, no heaves or thrills on palpation.  Lungs: Clear to auscultation, no wheezing or rhonchi noted. No increased vocal fremitus resonant to percussion  Abdomen: Soft, nontender, nondistended, positive bowel sounds, no masses no hepatosplenomegaly noted..  Neuro: No focal neurological deficits noted  cranial nerves II through XII grossly intact. DTRs 2+ bilaterally upper and lower extremities. Strength 5 out of 5 in bilateral upper and lower extremities. Musculoskeletal: Mid back pain, bilateral hip pain Psychiatric: Patient alert and oriented x3, good insight and cognition, good recent to remote recall. Lymph node survey: No cervical axillary or inguinal lymphadenopathy noted.  Lab Results:  Basic Metabolic Panel:    Component Value Date/Time   NA 138 01/30/2024 0120   NA 139 12/27/2023 1149   K 3.5 01/30/2024 0120   CL 107 01/30/2024 0120   CO2 24 01/30/2024 0120   BUN 7 01/30/2024 0120   BUN 9 12/27/2023 1149   CREATININE 0.58 01/30/2024 0120   GLUCOSE 100 (H) 01/30/2024 0120   CALCIUM 8.7 (L) 01/30/2024 0120   CBC:    Component Value Date/Time   WBC 9.8 01/30/2024 0120   HGB 7.9 (L) 01/30/2024 0120   HGB 9.3 (L) 12/27/2023 1149   HCT 22.5 (L) 01/30/2024 0120   HCT 27.7 (L) 12/27/2023 1149   PLT 524 (H) 01/30/2024 0120   PLT 465 (H) 12/27/2023 1149   MCV 84.6 01/30/2024 0120   MCV 90 12/27/2023 1149   NEUTROABS 3.3 01/29/2024 1515   NEUTROABS 3.6 12/27/2023 1149   LYMPHSABS 2.9 01/29/2024 1515   LYMPHSABS 4.6 (H) 12/27/2023 1149   MONOABS 0.7 01/29/2024 1515   EOSABS 0.1 01/29/2024 1515   EOSABS 0.2 12/27/2023 1149  BASOSABS 0.0 01/29/2024 1515   BASOSABS 0.0 12/27/2023 1149    No results found for this or any previous visit (from the past 240 hours).  Studies/Results: DG HIPS BILAT WITH PELVIS 3-4 VIEWS Result Date: 01/30/2024 CLINICAL DATA:  [id number] Vasoocclusive sickle cell crisis (HCC) [id number] Hip pain [id number] EXAM: DG HIP (WITH OR WITHOUT PELVIS) 3-4V BILAT COMPARISON:  None Available. FINDINGS: There is no evidence of hip fracture or dislocation either hip. No acute displaced fracture or diastasis of the bones of the pelvis. There is no evidence of arthropathy or other focal bone abnormality. IMPRESSION: Negative for acute traumatic injury.  Electronically Signed   By: Tish Frederickson M.D.   On: 01/30/2024 11:03   DG CHEST PORT 1 VIEW Result Date: 01/29/2024 CLINICAL DATA:  [id number] Vasoocclusive sickle cell crisis [facility]) [id number] EXAM: PORTABLE CHEST 1 VIEW COMPARISON:  May 22, 2023 FINDINGS: The cardiomediastinal silhouette is unchanged in contour. No pleural effusion. No pneumothorax. No acute pleuroparenchymal abnormality. IMPRESSION: No acute cardiopulmonary abnormality. Electronically Signed   By: Meda Klinefelter M.D.   On: 01/29/2024 21:35    Medications: Scheduled Meds:  cyclobenzaprine  10 mg Oral QHS   enoxaparin (LOVENOX) injection  40 mg Subcutaneous Q24H   folic acid  1 mg Oral Daily   HYDROmorphone   Intravenous Q4H   hydroxyurea  500 mg Oral Daily   ketorolac  15 mg Intravenous Q6H   lidocaine  2 patch Transdermal Q24H   morphine  60 mg Oral Q12H   oxyCODONE  10 mg Oral Q6H   sodium chloride flush  3 mL Intravenous Q12H   Continuous Infusions: PRN Meds:.acetaminophen, albuterol, diphenhydrAMINE, melatonin, naloxone **AND** sodium chloride flush, ondansetron (ZOFRAN) IV, oxyCODONE **OR** oxyCODONE, polyethylene glycol, sodium chloride flush  Consultants: None  Procedures: None  Antibiotics: None  Assessment/Plan: Principal Problem:   Vasoocclusive sickle cell crisis (HCC)   Hb Sickle Cell Disease with Pain crisis: Continue IVF 0.45% Saline @ 125 mls/hour, continue weight based Dilaudid PCA, IV Toradol 15 mg Q 6 H for a total of 5 days, continue oral home pain medications as ordered. Monitor vitals very closely, Re-evaluate pain scale regularly, 2 L of Oxygen by [place]. Patient encouraged to ambulate on the hallway today. Anemia of Chronic Disease: Hemoglobin is stable at baseline today.  There is no clinical indication for blood transfusion at this time.  Will monitor very closely and transfuse as appropriate. Chronic pain Syndrome: Continue home pain medication. Major depressive disorder: Clinically  stable.  Patient denies any suicidal ideations or thoughts.  Will continue her home medications.  Code Status: Full Code Family Communication: N/A Disposition Plan: Not yet ready for discharge  Daryll Drown NP  If 7PM-7AM, please contact night-coverage.  01/30/2024, 11:13 AM  LOS: 1 day

## 2024-01-30 NOTE — Progress Notes (Signed)
 Transition of Care [facility]) - Inpatient Brief Assessment  Patient Details  Name: Carmen [name] MRN: [id number] Date of Birth: 04-11-1991  Transition of [facility]) CM/SW Contact:    Ewing Schlein, LCSW Phone Number: 01/30/2024, 11:10 AM  Clinical Narrative: Screening completed. No TOC needs identified at this time.  Transition of Care Asessment: Insurance and Status: Insurance coverage has been reviewed Patient has primary care physician: Yes Home environment has been reviewed: Resides in an apartment Prior level of function:: Independent with ADLs at baseline Prior/Current Home Services: No current home services Social Drivers of Health Review: SDOH reviewed no interventions necessary Readmission risk has been reviewed: Yes Transition of care needs: no transition of care needs at this time   01/30/24 1109  Readmission Prevention Plan - Extreme Risk  Transportation Screening Complete  Medication Review Complete  Home Care Consult Complete  Social Work consult for recovery care planning/counseling (includes patient and caregiver) Complete  Palliative Care Screening Not Applicable  Skilled Nursing Facility Not Applicable

## 2024-01-30 NOTE — Plan of Care (Signed)

## 2024-01-30 NOTE — H&P (Signed)
 History and Physical    Carmen [name] KVQ:[id number] DOB: 1991-05-21 DOA: 01/29/2024  PCP: Donell Beers, FNP   Patient coming from: Home   Chief Complaint:  Chief Complaint  Patient presents with   Sickle Cell Pain Crisis    HPI:  Carmen [name] is a 33 y.o. female with hx of sickle cell disease, chronic pain, opiate tolerance, anemia of chronic disease, depression, who presents with worsening of mid back and hip pain consistent with sickle cell pain in the past.  This past [place].  She was doing well in terms of her chronic pain however today woke and had worsened pain in her mid back and her hips.  She is still able to bear weight.  No numbness or weakness into her lower extremities.  Has been taking her home pain medications as prescribed but not getting relief of pain.  No chest pain or shortness of breath, recent fever, chills cough/cold symptoms.  No clear trigger that she can identify.   Review of Systems:  ROS complete and negative except as marked above   Allergies  Allergen Reactions   Ketamine Hives, Nausea And Vomiting and Other (See Comments)    Makes the patient "feel funny" also    Prior to Admission medications   Medication Sig Start Date End Date Taking? Authorizing Provider  BENADRYL ALLERGY 25 MG capsule Take 25 mg by mouth every 6 (six) hours as needed for allergies.   Yes [provider]  cyclobenzaprine (FLEXERIL) 10 MG tablet Take 1 tablet (10 mg total) by mouth at bedtime. TAKE 1 TABLET BY MOUTH DAILY AS NEEDED FOR MUSCLE SPASMS. 01/16/24  Yes Paseda, Baird Kay, FNP  folic acid (FOLVITE) 1 MG tablet Take 1 tablet (1 mg total) by mouth daily. 12/27/23  Yes Paseda, Baird Kay, FNP  morphine (MS CONTIN) 60 MG 12 hr tablet Take 1 tablet (60 mg total) by mouth every 12 (twelve) hours. 01/29/24 02/28/24 Yes Paseda, Baird Kay, FNP  Oxycodone HCl 10 MG TABS Take 1 tablet (10 mg total) by mouth every 6  (six) hours as needed (pain). 01/23/24  Yes Paseda, Baird Kay, FNP  Vitamin D, Ergocalciferol, (DRISDOL) 1.25 MG (50000 UNIT) CAPS capsule TAKE 1 CAPSULE BY MOUTH ONE TIME PER WEEK 12/27/23  Yes Paseda, Baird Kay, FNP  hydroxyurea (HYDREA) 500 MG capsule Take 1 capsule (500 mg total) by mouth daily. Patient not taking: Reported on 01/29/2024 12/27/23   Donell Beers, FNP  nicotine (NICODERM CQ - DOSED IN MG/24 HOURS) 14 mg/24hr patch Place 1 patch (14 mg total) onto the skin daily. Patient not taking: Reported on 01/29/2024 12/27/23 02/07/24  Donell Beers, FNP  nicotine (NICODERM CQ - DOSED IN MG/24 HR) 7 mg/24hr patch Place 1 patch (7 mg total) onto the skin daily. Patient not taking: Reported on 01/29/2024 02/08/24   Donell Beers, FNP    Past Medical History:  Diagnosis Date   Anxiety    Headache(784.0)    Heart murmur    Sickle cell crisis [facility])    Syphilis 2015   Was diagnosed and received one injection of antibiotics   Thrombocytosis 11/22/2014     CBC Latest Ref Rng & Units 12/13/2018 12/11/2018 12/10/2018 WBC 4.0 - 10.5 K/uL 14.8(H) 13.2(H) 15.9(H) Hemoglobin 12.0 - 15.0 g/dL 8.2(L) 7.7(L) 8.1(L) Hematocrit 36.0 - 46.0 % 23.7(L) 23.2(L) 24.5(L) Platelets 150 - 400 K/uL 326 399 388      Past  Surgical History:  Procedure Laterality Date   CESAREAN SECTION N/A 02/05/2019   Procedure: CESAREAN SECTION;  Surgeon: Hermina Staggers, MD;  Location: [facility] LD ORS;  Service: Obstetrics;  Laterality: N/A;   CHOLECYSTECTOMY N/A 11/30/2014   Procedure: LAPAROSCOPIC CHOLECYSTECTOMY SINGLE SITE WITH INTRAOPERATIVE CHOLANGIOGRAM;  Surgeon: Karie Soda, MD;  Location: [facility] ORS;  Service: General;  Laterality: N/A;   SPLENECTOMY       reports that she has been smoking cigarettes. She has never used smokeless tobacco. She reports current drug use. She reports that she does not drink alcohol.  Family History  Problem Relation Age of Onset   Hypertension Mother    Sickle cell anemia Sister     Kidney disease Sister        Lupus   Arthritis Sister    Sickle cell anemia Sister    Sickle cell trait Sister    Heart disease Maternal Aunt        CABG   Heart disease Maternal Uncle        CABG   Lupus Sister      Physical Exam: Vitals:   01/29/24 2238 01/29/24 2238 01/29/24 2254 01/30/24 0011  BP: 118/80 118/80    Pulse: 67 67    Resp: 20 20 20 16   Temp: 98 F (36.7 C) 98 F (36.7 C)    TempSrc: Oral Oral    SpO2:  99%    Weight: 85 kg     Height: 5\' 3"  (1.6 m)       Gen: Awake, alert, NAD   CV: Regular, normal S1, S2, no murmurs  Resp: Normal WOB, CTAB  Abd: Flat, normoactive, nontender MSK: Symmetric, no edema  Skin: No rashes or lesions to exposed skin  Neuro: Alert and interactive  Psych: euthymic, appropriate    Data review:   Labs reviewed, notable for:   Bicarb 20, no anion gap WBC 7, hemoglobin 9.1 NRBC 0.8, reticulocyte percent 7.4 up from prior hCG negative.  Micro:  Results for orders placed or performed during the hospital encounter of 04/07/22  SARS Coronavirus 2 by RT PCR (hospital order, performed in [facility] hospital lab) *cepheid single result test* Anterior Nasal Swab     Status: None   Collection Time: 04/07/22  7:09 PM   Specimen: Anterior Nasal Swab  Result Value Ref Range Status   SARS Coronavirus 2 by RT PCR NEGATIVE NEGATIVE Final    Comment: (NOTE) SARS-CoV-2 target nucleic acids are NOT DETECTED.  The SARS-CoV-2 RNA is generally detectable in upper and lower respiratory specimens during the acute phase of infection. The lowest concentration of SARS-CoV-2 viral copies this assay can detect is 250 copies / mL. A negative result does not preclude SARS-CoV-2 infection and should not be used as the sole basis for treatment or other patient management decisions.  A negative result may occur with improper specimen collection / handling, submission of specimen other than nasopharyngeal swab, presence of viral mutation(s) within  the areas targeted by this assay, and inadequate number of viral copies (<250 copies / mL). A negative result must be combined with clinical observations, patient history, and epidemiological information.  Fact Sheet for Patients:   RoadLapTop.co.za  Fact Sheet for Healthcare Providers: http://kim-miller.com/  This test is not yet approved or  cleared by the [place] FDA and has been authorized for detection and/or diagnosis of SARS-CoV-2 by FDA under an Emergency Use Authorization (EUA).  This EUA will remain in effect (meaning this test can  be used) for the duration of the COVID-19 declaration under Section 564(b)(1) of the Act, 21 U.S.C. section 360bbb-3(b)(1), unless the authorization is terminated or revoked sooner.  Performed at [facility], [address] [place], [place] [address]    *Note: Due to a large number of results and/or encounters for the requested time period, some results have not been displayed. A complete set of results can be found in Results Review.    Imaging reviewed:  DG CHEST PORT 1 VIEW Result Date: 01/29/2024 CLINICAL DATA:  [id number] Vasoocclusive sickle cell crisis [facility]) [id number] EXAM: PORTABLE CHEST 1 VIEW COMPARISON:  May 22, 2023 FINDINGS: The cardiomediastinal silhouette is unchanged in contour. No pleural effusion. No pneumothorax. No acute pleuroparenchymal abnormality. IMPRESSION: No acute cardiopulmonary abnormality. Electronically Signed   By: Meda Klinefelter M.D.   On: 01/29/2024 21:35     ED Course:  Treated with 4 doses of Dilaudid 2 mg IV, Toradol without relief of her symptoms.  Referral for admission for vaso-occlusive crisis.   Assessment/Plan:  33 y.o. female with hx sickle cell disease, chronic pain, opiate tolerance, anemia of chronic disease, depression, who is admitted with vaso-occlusive crisis  Sickle cell vaso-occlusive crisis Acute onset of pain in her  mid back and both hips.  No clear identifiable trigger.  Hemoglobin 9.1 today down from baseline, increased reticulocyte percent at 7.4.  - Sickle cell team to assume care in the morning, admitted to [facility]. - Start on Dilaudid PCA, slightly reduced from past admission to begin add 0.2 mg every 10 minutes with 1.2 mg 1 hour lockout.  Can uptitrate every 4-6 hours to achieve analgesia - Schedule home MS Contin 60 mg every 12 hours, and home oxycodone 10 mg every 6 hour. - Add an additional 5/10 mg of oxycodone every 6 hours for moderate/severe pain as needed - Adjuncts with Tylenol prn, Toradol 30 mg every 8 hours scheduled for 2 days, lidocaine, heat. - Continue home folate and hydroxyurea 500 mg daily (unclear if taking hydroxyurea at home) - Check chest x-ray although no respiratory symptoms, x-ray hip to eval for AVN.  Not completed prior to transport to [facility] - Incentive spirometry    Body mass index is 33.19 kg/m.  Obesity class I would benefit from weight loss outpatient  DVT prophylaxis:  Lovenox Code Status:  Full Code Diet:  Diet Orders (From admission, onward)     Start     Ordered   01/29/24 2113  Diet regular Room service appropriate? Yes; Fluid consistency: Thin  Diet effective now       Question Answer Comment  Room service appropriate? Yes   Fluid consistency: Thin      01/29/24 2120           Family Communication:  No   Consults:  None   Admission status:   Inpatient, Step Down Unit  Severity of Illness: The appropriate patient status for this patient is INPATIENT. Inpatient status is judged to be reasonable and necessary in order to provide the required intensity of service to ensure the patient's safety. The patient's presenting symptoms, physical exam findings, and initial radiographic and laboratory data in the context of their chronic comorbidities is felt to place them at high risk for further clinical deterioration. Furthermore, it is not anticipated  that the patient will be medically stable for discharge from the hospital within 2 midnights of admission.   * I certify that at the point of admission it is my clinical  judgment that the patient will require inpatient hospital care spanning beyond 2 midnights from the point of admission due to high intensity of service, high risk for further deterioration and high frequency of surveillance required.*   Dolly Rias, MD [facility]  How to contact the [facility] Attending or Consulting provider 7A - 7P or covering provider during after hours 7P -7A, for this patient.  Check the care team in [facility] and look for a) attending/consulting TRH provider listed and b) the [facility] team listed Log into [email or web address] and use [place]'s universal password to access. If you do not have the password, please contact the hospital operator. Locate the [facility] provider you are looking for under [facility] and page to a number that you can be directly reached. If you still have difficulty reaching the provider, please page the [facility] (Director on Call) for the Hospitalists listed on amion for assistance.  01/30/2024, 12:39 AM

## 2024-01-31 NOTE — Discharge Summary (Signed)
 Physician Discharge Summary  Carmen [name] [id number]:[id number] DOB: [birth year] DOA: 01/29/2024  PCP: Donell Beers, FNP  Admit date: 01/29/2024  Discharge date: 01/31/2024  Discharge Diagnoses:  Principal Problem:   Vasoocclusive sickle cell crisis [facility])   Discharge Condition: Stable  Disposition:  Pt is discharged home in good condition and is to follow up with Donell Beers, FNP this week to have labs evaluated. Carmen [name] is instructed to increase activity slowly and balance with rest for the next few days, and use prescribed medication to complete treatment of pain  Diet: Regular Wt Readings from Last 3 Encounters:  01/29/24 85 kg  12/28/23 84.4 kg  12/27/23 84.4 kg    History of present illness:  Carmen [name]  is a 33 y.o. female with medical history significant for sickle cell disease, chronic pain syndrome, opiate dependence and tolerance, anemia of chronic disease and major depressive disorder, who presented to the emergency room with major complaints of generalized body pain most especially in her bilateral hip joints that is consistent with her typical sickle cell pain crises.  She has taken her home pain medications with no sustained relief. She says she got a little bit of relief in the emergency room after many doses of IV Dilaudid but her hip pain would not just go down.  She is ratees her pain at about 9/10.  She does not remember any inciting factor. She denies any fever, cough, chest pain, shortness of breath, nausea, vomiting or diarrhea.  No urinary symptoms.  She denies any recent travels, sick contacts.  ED Course: Labs Reviewed  COMPREHENSIVE METABOLIC PANEL WITH GFR - Abnormal; Notable for the following components:      Result Value   CO2 20 (*)    All other components within normal limits  CBC WITH DIFFERENTIAL/PLATELET - Abnormal; Notable for the following components:   RBC 3.02 (*)    Hemoglobin 9.1 (*)    HCT 25.0 (*)    MCHC 36.4 (*)     Platelets 629 (*)    nRBC 0.8 (*)    All other components within normal limits  RETICULOCYTES - Abnormal; Notable for the following components:   Retic Ct Pct 7.4 (*)    RBC. 2.98 (*)    Retic Count, Absolute 221.4 (*)    Immature Retic Fract 41.3 (*)    All other components within normal limits  BASIC METABOLIC PANEL WITH GFR - Abnormal; Notable for the following components:   Glucose, Bld 100 (*)    Calcium 8.7 (*)    All other components within normal limits  CBC - Abnormal; Notable for the following components:   RBC 2.66 (*)    Hemoglobin 7.9 (*)    HCT 22.5 (*)    Platelets 524 (*)    nRBC 0.7 (*)    All other components within normal limits  HCG, SERUM, QUALITATIVE  MAGNESIUM  PHOSPHORUS      Hospital Course:  Patient was admitted for sickle cell pain crisis and managed appropriately with IVF, IV Dilaudid via PCA and IV Toradol, as well as other adjunct therapies per sickle cell pain management protocols.Patient is tolerating PO well, pain is down to base line at 3/10. She has no new concerns.  Patient was therefore discharged home today in a hemodynamically stable condition.   Carmen [name] will follow-up with PCP within 1 week of this discharge. Carmen [name] was counseled extensively about nonpharmacologic means of pain management, patient verbalized understanding and was appreciative of  the care received during this admission.   We discussed the need for good hydration, monitoring of hydration status, avoidance of heat, cold, stress, and infection triggers. We discussed the need to be adherent with taking other home medications. Patient was reminded of the need to seek medical attention immediately if any symptom of bleeding, anemia, or infection occurs.  Discharge Exam: Vitals:   01/31/24 0817 01/31/24 1036  BP:  111/64  Pulse:  61  Resp: 16 16  Temp:  98.4 F (36.9 C)  SpO2:  95%   Vitals:   01/31/24 0439 01/31/24 0639 01/31/24 0817 01/31/24 1036  BP:  125/76  111/64   Pulse:  79  61  Resp: 18 18 16 16   Temp:  98.2 F (36.8 C)  98.4 F (36.9 C)  TempSrc:  Oral  Oral  SpO2:  95%  95%  Weight:      Height:        General appearance : Awake, alert, not in any distress. Speech Clear. Not toxic looking HEENT: Atraumatic and Normocephalic, pupils equally reactive to light and accomodation Neck: Supple, no JVD. No cervical lymphadenopathy.  Chest: Good air entry bilaterally, no added sounds  CVS: S1 S2 regular, no murmurs.  Abdomen: Bowel sounds present, Non tender and not distended with no gaurding, rigidity or rebound. Extremities: B/L Lower Ext shows no edema, both legs are warm to touch Neurology: Awake alert, and oriented X 3, CN II-XII intact, Non focal Skin: No Rash  Discharge Instructions  Discharge Instructions     Diet - low sodium heart healthy   Complete by: As directed    Increase activity slowly   Complete by: As directed       Allergies as of 01/31/2024       Reactions   Ketamine Hives, Nausea And Vomiting, Other (See Comments)   Makes the patient "feel funny" also        Medication List     TAKE these medications    Benadryl Allergy 25 MG capsule Generic drug: diphenhydrAMINE Take 25 mg by mouth every 6 (six) hours as needed for allergies.   cyclobenzaprine 10 MG tablet Commonly known as: FLEXERIL Take 1 tablet (10 mg total) by mouth at bedtime. TAKE 1 TABLET BY MOUTH DAILY AS NEEDED FOR MUSCLE SPASMS.   folic acid 1 MG tablet Commonly known as: FOLVITE Take 1 tablet (1 mg total) by mouth daily.   hydroxyurea 500 MG capsule Commonly known as: HYDREA Take 1 capsule (500 mg total) by mouth daily.   morphine 60 MG 12 hr tablet Commonly known as: MS CONTIN Take 1 tablet (60 mg total) by mouth every 12 (twelve) hours.   nicotine 14 mg/24hr patch Commonly known as: NICODERM CQ - dosed in mg/24 hours Place 1 patch (14 mg total) onto the skin daily.   nicotine 7 mg/24hr patch Commonly known as: NICODERM CQ -  dosed in mg/24 hr Place 1 patch (7 mg total) onto the skin daily. Start taking on: February 08, 2024   Oxycodone HCl 10 MG Tabs Take 1 tablet (10 mg total) by mouth every 6 (six) hours as needed (pain).   Vitamin D (Ergocalciferol) 1.25 MG (50000 UNIT) Caps capsule Commonly known as: DRISDOL TAKE 1 CAPSULE BY MOUTH ONE TIME PER WEEK        The results of significant diagnostics from this hospitalization (including imaging, microbiology, ancillary and laboratory) are listed below for reference.    Significant Diagnostic Studies: DG HIPS BILAT WITH  PELVIS 3-4 VIEWS Result Date: 01/30/2024 CLINICAL DATA:  [id number] Vasoocclusive sickle cell crisis (HCC) [id number] Hip pain [id number] EXAM: DG HIP (WITH OR WITHOUT PELVIS) 3-4V BILAT COMPARISON:  None Available. FINDINGS: There is no evidence of hip fracture or dislocation either hip. No acute displaced fracture or diastasis of the bones of the pelvis. There is no evidence of arthropathy or other focal bone abnormality. IMPRESSION: Negative for acute traumatic injury. Electronically Signed   By: Tish Frederickson M.D.   On: 01/30/2024 11:03   DG CHEST PORT 1 VIEW Result Date: 01/29/2024 CLINICAL DATA:  [id number] Vasoocclusive sickle cell crisis [facility]) [id number] EXAM: PORTABLE CHEST 1 VIEW COMPARISON:  May 22, 2023 FINDINGS: The cardiomediastinal silhouette is unchanged in contour. No pleural effusion. No pneumothorax. No acute pleuroparenchymal abnormality. IMPRESSION: No acute cardiopulmonary abnormality. Electronically Signed   By: Meda Klinefelter M.D.   On: 01/29/2024 21:35    Microbiology: No results found for this or any previous visit (from the past 240 hours).   Labs: Basic Metabolic Panel: Recent Labs  Lab 01/29/24 1515 01/30/24 0120  NA 138 138  K 4.3 3.5  CL 109 107  CO2 20* 24  GLUCOSE 91 100*  BUN 6 7  CREATININE 0.56 0.58  CALCIUM 8.9 8.7*  MG  --  2.1  PHOS  --  3.3   Liver Function Tests: Recent Labs  Lab 01/29/24 1515   AST 32  ALT 20  ALKPHOS 51  BILITOT 0.9  PROT 7.2  ALBUMIN 3.7   No results for input(s): "LIPASE", "AMYLASE" in the last 168 hours. No results for input(s): "AMMONIA" in the last 168 hours. CBC: Recent Labs  Lab 01/29/24 1515 01/30/24 0120  WBC 7.1 9.8  NEUTROABS 3.3  --   HGB 9.1* 7.9*  HCT 25.0* 22.5*  MCV 82.8 84.6  PLT 629* 524*   Cardiac Enzymes: No results for input(s): "CKTOTAL", "CKMB", "CKMBINDEX", "TROPONINI" in the last 168 hours. BNP: Invalid input(s): "POCBNP" CBG: No results for input(s): "GLUCAP" in the last 168 hours.  Time coordinating discharge: 50 minutes  Signed:  Daryll Drown NP  [facility] 01/31/2024, 11:14 AM

## 2024-01-31 NOTE — Progress Notes (Signed)
 Discharge instructions reviewed with patient. All questions answered. All belongings accounted for. Patient to follow up with MD. Appointment already set. No changes to medications noted.  Midline IV removed. Pressure held 3 min. No bleeding noted.   Patient called family for ride home.

## 2024-02-01 ENCOUNTER — Telehealth: Payer: Self-pay | Admitting: *Deleted

## 2024-02-01 NOTE — Transitions of Care (Post Inpatient/ED Visit) (Signed)
   02/01/2024  Name: Carmen [name] MRN: [id number] DOB: 1991/08/20  Today's TOC FU Call Status: Today's TOC FU Call Status:: Unsuccessful Call (1st Attempt) Unsuccessful Call (1st Attempt) Date: 02/01/24  Attempted to reach the patient regarding the most recent Inpatient/ED visit.  Follow Up Plan: Additional outreach attempts will be made to reach the patient to complete the Transitions of Care (Post Inpatient/ED visit) call.   Irving Shows [facility], BSN RN Care Manager/ Transition of Care [place]/ [facility] [phone]

## 2024-02-02 ENCOUNTER — Telehealth: Payer: Self-pay

## 2024-02-02 NOTE — Transitions of Care (Post Inpatient/ED Visit) (Signed)
   02/02/2024  Name: Carmen [name] MRN: [id number] DOB: May 07, 1991  Today's TOC FU Call Status: Today's TOC FU Call Status:: Unsuccessful Call (2nd Attempt) Unsuccessful Call (2nd Attempt) Date: 02/02/24  Attempted to reach the patient regarding the most recent Inpatient/ED visit.  Follow Up Plan: Additional outreach attempts will be made to reach the patient to complete the Transitions of Care (Post Inpatient/ED visit) call.   Lonia Chimera, RN, BSN, CEN [employer]- Transition of Care Team.  [facility] [phone]

## 2024-02-03 ENCOUNTER — Telehealth: Payer: Self-pay | Admitting: *Deleted

## 2024-02-03 NOTE — Transitions of Care (Post Inpatient/ED Visit) (Signed)
   02/03/2024  Name: Carmen [name] MRN: [id number] DOB: 1991/02/20  Today's TOC FU Call Status: Today's TOC FU Call Status:: Unsuccessful Call (3rd Attempt) Unsuccessful Call (3rd Attempt) Date: 02/03/24  Attempted to reach the patient regarding the most recent Inpatient/ED visit.  Follow Up Plan: No further outreach attempts will be made at this time. We have been unable to contact the patient.  Irving Shows [facility], BSN RN Care Manager/ Transition of Care [place]/ [facility] [phone]

## 2024-02-09 ENCOUNTER — Encounter: Payer: MEDICAID | Admitting: Radiology

## 2024-02-09 ENCOUNTER — Other Ambulatory Visit: Payer: Self-pay

## 2024-02-09 DIAGNOSIS — D571 Sickle-cell disease without crisis: Secondary | ICD-10-CM

## 2024-02-09 DIAGNOSIS — Z79891 Long term (current) use of opiate analgesic: Secondary | ICD-10-CM

## 2024-02-09 MED ORDER — OXYCODONE HCL 10 MG PO TABS: 10.0000 mg | ORAL_TABLET | Freq: Four times a day (QID) | ORAL | 0 refills | Status: DC | PRN

## 2024-02-09 NOTE — Telephone Encounter (Signed)
 Please advise in Fola's absence. KH

## 2024-02-24 ENCOUNTER — Ambulatory Visit (INDEPENDENT_AMBULATORY_CARE_PROVIDER_SITE_OTHER): Payer: MEDICAID | Admitting: Nurse Practitioner

## 2024-02-24 ENCOUNTER — Encounter: Payer: Self-pay | Admitting: Nurse Practitioner

## 2024-02-24 VITALS — BP 117/68 | HR 92 | Temp 98.3°F | Wt 183.0 lb

## 2024-02-24 DIAGNOSIS — F17200 Nicotine dependence, unspecified, uncomplicated: Secondary | ICD-10-CM

## 2024-02-24 DIAGNOSIS — D571 Sickle-cell disease without crisis: Secondary | ICD-10-CM

## 2024-02-24 DIAGNOSIS — E559 Vitamin D deficiency, unspecified: Secondary | ICD-10-CM

## 2024-02-24 DIAGNOSIS — F1290 Cannabis use, unspecified, uncomplicated: Secondary | ICD-10-CM

## 2024-02-24 NOTE — Assessment & Plan Note (Signed)
 Cessation encouraged

## 2024-02-24 NOTE — Patient Instructions (Addendum)
 [address] [place] [address]  P:  [phone] F:  [phone]    It is important that you exercise regularly at least 30 minutes 5 times a week as tolerated  Think about what you will eat, plan ahead. Choose " clean, green, fresh or frozen" over canned, processed or packaged foods which are more sugary, salty and fatty. 70 to 75% of food eaten should be vegetables and fruit. Three meals at set times with snacks allowed between meals, but they must be fruit or vegetables. Aim to eat over a 12 hour period , example 7 am to 7 pm, and STOP after  your last meal of the day. Drink water ,generally about 64 ounces per day, no other drink is as healthy. Fruit juice is best enjoyed in a healthy way, by EATING the fruit.  Thanks for choosing [facility] we consider it a privelige to serve you.

## 2024-02-24 NOTE — Assessment & Plan Note (Addendum)
 Sickle cell disease .she has stopped taking Hydrea  500 mg daily due to nausea.  Continue folic acid  1 mg daily to prevent aplastic bone marrow crises.  The patient was reminded of the need to seek medical attention of any symptoms of bleeding, anemia, or infection.      Eye - High risk of proliferative retinopathy. Annual eye exam with retinal exam recommended to patient.Aaron Aas  Ophthalmologist office phone number and address provided   Immunization status -up-to-date with her immunizations   Acute and chronic painful episodes -  .  We discussed that pt is to receive her Schedule II prescriptions only from us . Pt is also aware that the prescription history is available to us  online through the [facility] CSRS. We reminded the patient that all patients receiving Schedule II narcotics must be seen for follow within the 3 months in the office.  Opioid prescription agreement form signed today we reviewed the terms of our pain agreement, including the need to keep medicines in a safe locked location away from children or pets, and the need to report excess sedation or constipation, measures to avoid constipation.  Continue MS Contin  60 mg every 12 hours, oxycodone  10 mg every 6 hours as needed  Follow-up with hematologist at [facility] as planned, follow-up in the office in 3 months    - Sickle Cell Panel - ToxAssure Flex 15, Ur

## 2024-02-24 NOTE — Assessment & Plan Note (Signed)
 She has quit smoking cigarettes Patient congratulated on efforts at smoking cessation, encouraged to continue to abstain from smoking cigarettes

## 2024-02-24 NOTE — Progress Notes (Signed)
 Established Patient Office Visit  Subjective:  Patient ID: Carmen [name], female    DOB: December 15, 1990  Age: 33 y.o. MRN: [id number]  CC:  Chief Complaint  Patient presents with   Sickle Cell Anemia    HPI Carmen [name] is a 33 y.o. female  has a past medical history of Anxiety, Headache(784.0), Heart murmur, Sickle cell crisis (HCC), Syphilis (2015), and Thrombocytosis (11/22/2014).  Patient presents for follow-up for her chronic medical conditions  Sickle cell disease.  She has had 2 sickle cell crisis requiring admissions since her last visit , she was on hydroxyurea  500 mg daily but she had stopped taking the medication because she had developed severe nausea, for chronic pain takes MS Contin  60 mg twice daily, oxycodone  10 mg every 6 hours as needed.  Has appointment with hematologist at [facility] in September.  Has sickle cell pain is mainly in her lower back , her pain could be a 7/10 but after taking medications it goes down to 4/10 .oxycodone  was last taken this morning .due for yearly eye exam.  She currently denies fever, chills, chest pain, shortness of breath, abdominal pain, nausea, vomiting  She has quit smoking cigarettes since 2 weeks ago, still smokes marijuana.    Past Medical History:  Diagnosis Date   Anxiety    Headache(784.0)    Heart murmur    Sickle cell crisis (HCC)    Syphilis 2015   Was diagnosed and received one injection of antibiotics   Thrombocytosis 11/22/2014     CBC Latest Ref Rng & Units 12/13/2018 12/11/2018 12/10/2018 WBC 4.0 - 10.5 K/uL 14.8(H) 13.2(H) 15.9(H) Hemoglobin 12.0 - 15.0 g/dL 8.2(L) 7.7(L) 8.1(L) Hematocrit 36.0 - 46.0 % 23.7(L) 23.2(L) 24.5(L) Platelets 150 - 400 K/uL 326 399 388      Past Surgical History:  Procedure Laterality Date   CESAREAN SECTION N/A 02/05/2019   Procedure: CESAREAN SECTION;  Surgeon: Othelia Blinks, MD;  Location: [facility];  Service: Obstetrics;  Laterality: N/A;   CHOLECYSTECTOMY N/A 11/30/2014   Procedure:  LAPAROSCOPIC CHOLECYSTECTOMY SINGLE SITE WITH INTRAOPERATIVE CHOLANGIOGRAM;  Surgeon: Candyce Champagne, MD;  Location: [facility] ORS;  Service: General;  Laterality: N/A;   SPLENECTOMY      Family History  Problem Relation Age of Onset   Hypertension Mother    Sickle cell anemia Sister    Kidney disease Sister        Lupus   Arthritis Sister    Sickle cell anemia Sister    Sickle cell trait Sister    Heart disease Maternal Aunt        CABG   Heart disease Maternal Uncle        CABG   Lupus Sister     Social History   Socioeconomic History   Marital status: Single    Spouse name: Not on file   Number of children: 1   Years of education: Not on file   Highest education level: 12th grade  Occupational History   Not on file  Tobacco Use   Smoking status: Former    Types: Cigarettes   Smokeless tobacco: Never  Vaping Use   Vaping status: Never Used  Substance and Sexual Activity   Alcohol use: No   Drug use: Yes    Comment: marijuana   Sexual activity: Yes    Birth control/protection: Injection  Other Topics Concern   Not on file  Social History Narrative   Lives with her mother and her  son   Social Drivers of Corporate investment banker Strain: Low Risk  (01/06/2024)   Received from [facility]   Overall Financial Resource Strain (CARDIA)    Difficulty of Paying Living Expenses: Not very hard  Recent Concern: Financial Resource Strain - Medium Risk (12/27/2023)   Overall Financial Resource Strain (CARDIA)    Difficulty of Paying Living Expenses: Somewhat hard  Food Insecurity: No Food Insecurity (01/29/2024)   Hunger Vital Sign    Worried About Running Out of Food in the Last Year: Never true    Ran Out of Food in the Last Year: Never true  Recent Concern: Food Insecurity - Food Insecurity Present (01/06/2024)   Received from [facility]   Hunger Vital Sign    Worried About Running Out of Food in the Last Year: Sometimes true    Ran Out  of Food in the Last Year: Sometimes true  Transportation Needs: Unmet Transportation Needs (02/24/2024)   PRAPARE - Transportation    Lack of Transportation (Medical): Yes    Lack of Transportation (Non-Medical): Yes  Physical Activity: Insufficiently Active (12/27/2023)   Exercise Vital Sign    Days of Exercise per Week: 2 days    Minutes of Exercise per Session: 20 min  Stress: Stress Concern Present (12/27/2023)   [employer] of Occupational Health - Occupational Stress Questionnaire    Feeling of Stress : Very much  Social Connections: Moderately Isolated (01/29/2024)   Social Connection and Isolation Panel [NHANES]    Frequency of Communication with Friends and Family: More than three times a week    Frequency of Social Gatherings with Friends and Family: Once a week    Attends Religious Services: More than 4 times per year    Active Member of [employer] or Organizations: No    Attends Banker Meetings: Never    Marital Status: Never married  Intimate Partner Violence: Not At Risk (01/29/2024)   Humiliation, Afraid, Rape, and Kick questionnaire    Fear of Current or Ex-Partner: No    Emotionally Abused: No    Physically Abused: No    Sexually Abused: No    Outpatient Medications Prior to Visit  Medication Sig Dispense Refill   BENADRYL  ALLERGY 25 MG capsule Take 25 mg by mouth every 6 (six) hours as needed for allergies.     cyclobenzaprine  (FLEXERIL ) 10 MG tablet Take 1 tablet (10 mg total) by mouth at bedtime. TAKE 1 TABLET BY MOUTH DAILY AS NEEDED FOR MUSCLE SPASMS. 30 tablet 2   folic acid  (FOLVITE ) 1 MG tablet Take 1 tablet (1 mg total) by mouth daily. 90 tablet 3   morphine  (MS CONTIN ) 60 MG 12 hr tablet Take 1 tablet (60 mg total) by mouth every 12 (twelve) hours. 60 tablet 0   Oxycodone  HCl 10 MG TABS Take 1 tablet (10 mg total) by mouth every 6 (six) hours as needed (pain). 60 tablet 0   Vitamin D , Ergocalciferol , (DRISDOL ) 1.25 MG (50000 UNIT) CAPS capsule  TAKE 1 CAPSULE BY MOUTH ONE TIME PER WEEK 12 capsule 4   hydroxyurea  (HYDREA ) 500 MG capsule Take 1 capsule (500 mg total) by mouth daily. (Patient not taking: Reported on 01/29/2024) 90 capsule 1   nicotine  (NICODERM CQ  - DOSED IN MG/24 HR) 7 mg/24hr patch Place 1 patch (7 mg total) onto the skin daily. (Patient not taking: Reported on 01/29/2024) 14 patch 0   No facility-administered medications prior to visit.  Allergies  Allergen Reactions   Ketamine  Hives, Nausea And Vomiting and Other (See Comments)    Makes the patient "feel funny" also    ROS Review of Systems  Constitutional:  Negative for appetite change, chills, fatigue and fever.  HENT:  Negative for congestion.   Respiratory:  Negative for cough, shortness of breath and wheezing.   Cardiovascular:  Negative for chest pain, palpitations and leg swelling.  Gastrointestinal:  Negative for abdominal pain, constipation, nausea and vomiting.  Genitourinary:  Negative for difficulty urinating, dysuria, flank pain and frequency.  Musculoskeletal:  Positive for arthralgias. Negative for back pain, joint swelling and myalgias.  Skin:  Negative for color change, pallor, rash and wound.  Neurological:  Negative for dizziness, facial asymmetry, weakness, numbness and headaches.  Psychiatric/Behavioral:  Negative for behavioral problems, confusion, self-injury and suicidal ideas.       Objective:    Physical Exam Vitals and nursing note reviewed.  Constitutional:      General: She is not in acute distress.    Appearance: Normal appearance. She is obese. She is not ill-appearing, toxic-appearing or diaphoretic.  HENT:     Mouth/Throat:     Pharynx: No oropharyngeal exudate or posterior oropharyngeal erythema.  Eyes:     General: Scleral icterus present.        Right eye: No discharge.        Left eye: No discharge.     Extraocular Movements: Extraocular movements intact.     Conjunctiva/sclera: Conjunctivae normal.   Cardiovascular:     Rate and Rhythm: Normal rate and regular rhythm.     Pulses: Normal pulses.     Heart sounds: Normal heart sounds. No murmur heard.    No friction rub. No gallop.  Pulmonary:     Effort: Pulmonary effort is normal. No respiratory distress.     Breath sounds: Normal breath sounds. No stridor. No wheezing, rhonchi or rales.  Chest:     Chest wall: No tenderness.  Abdominal:     General: There is no distension.     Palpations: Abdomen is soft.     Tenderness: There is no abdominal tenderness. There is no right CVA tenderness, left CVA tenderness or guarding.  Musculoskeletal:        General: No swelling, deformity or signs of injury.     Right lower leg: No edema.     Left lower leg: No edema.  Skin:    General: Skin is warm and dry.     Capillary Refill: Capillary refill takes less than 2 seconds.     Coloration: Skin is not jaundiced or pale.     Findings: No bruising, erythema or lesion.  Neurological:     Mental Status: She is alert and oriented to person, place, and time.     Motor: No weakness.     Coordination: Coordination normal.     Gait: Gait normal.  Psychiatric:        Mood and Affect: Mood normal.        Behavior: Behavior normal.        Thought Content: Thought content normal.        Judgment: Judgment normal.     BP 117/68   Pulse 92   Temp 98.3 F (36.8 C)   Wt 183 lb (83 kg)   SpO2 100%   BMI 32.42 kg/m  Wt Readings from Last 3 Encounters:  02/24/24 183 lb (83 kg)  01/29/24 187 lb 6.3 oz (85 kg)  12/28/23 186 lb 1.1 oz (84.4 kg)    No results found for: "TSH" Lab Results  Component Value Date   WBC 9.8 01/30/2024   HGB 7.9 (L) 01/30/2024   HCT 22.5 (L) 01/30/2024   MCV 84.6 01/30/2024   PLT 524 (H) 01/30/2024   Lab Results  Component Value Date   NA 138 01/30/2024   K 3.5 01/30/2024   CO2 24 01/30/2024   GLUCOSE 100 (H) 01/30/2024   BUN 7 01/30/2024   CREATININE 0.58 01/30/2024   BILITOT 0.9 01/29/2024   ALKPHOS  51 01/29/2024   AST 32 01/29/2024   ALT 20 01/29/2024   PROT 7.2 01/29/2024   ALBUMIN 3.7 01/29/2024   CALCIUM  8.7 (L) 01/30/2024   ANIONGAP 7 01/30/2024   EGFR 123 12/27/2023   No results found for: "CHOL" No results found for: "HDL" No results found for: "LDLCALC" No results found for: "TRIG" No results found for: "CHOLHDL" No results found for: "HGBA1C"    Assessment & Plan:   Problem List Items Addressed This Visit       Other   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  Continue folic acid  1 mg daily to prevent aplastic bone marrow crises.  The patient was reminded of the need to seek medical attention of any symptoms of bleeding, anemia, or infection.      Eye - High risk of proliferative retinopathy. Annual eye exam with retinal exam recommended to patient.Aaron Aas  Ophthalmologist office phone number and address provided   Immunization status -up-to-date with her immunizations   Acute and chronic painful episodes -  .  We discussed that pt is to receive her Schedule II prescriptions only from us . Pt is also aware that the prescription history is available to us  online through the [facility] CSRS. We reminded the patient that all patients receiving Schedule II narcotics must be seen for follow within the 3 months in the office.  Opioid prescription agreement form signed today we reviewed the terms of our pain agreement, including the need to keep medicines in a safe locked location away from children or pets, and the need to report excess sedation or constipation, measures to avoid constipation.  Continue MS Contin  60 mg every 12 hours, oxycodone  10 mg every 6 hours as needed  Follow-up with hematologist at [facility] as planned, follow-up in the office in 3 months    - Sickle Cell Panel - ToxAssure Flex 15, Ur          Relevant Orders   Sickle Cell Panel   ToxAssure Flex 15, Ur   Vitamin  D deficiency   Continue vitamin D  50,000 units weekly Last vitamin D  Lab Results  Component Value Date   VD25OH 35.8 12/27/2023         Tobacco use disorder   She has quit smoking cigarettes Patient congratulated on efforts at smoking cessation, encouraged to continue to abstain from smoking cigarettes      Marijuana smoker   Cessation encouraged       No orders of the defined types were placed in this encounter.   Follow-up: Return in about 3 months (around 05/25/2024) for CPE.    Emeri Estill R Khianna Blazina, FNP

## 2024-02-24 NOTE — Assessment & Plan Note (Signed)
 Continue vitamin D  50,000 units weekly Last vitamin D  Lab Results  Component Value Date   VD25OH 35.8 12/27/2023

## 2024-02-25 ENCOUNTER — Inpatient Hospital Stay (HOSPITAL_COMMUNITY)
Admission: EM | Admit: 2024-02-25 | Discharge: 2024-02-29 | DRG: 812 | Disposition: A | Discharge status: Home / Self Care | Payer: MEDICAID | Attending: Internal Medicine | Admitting: Internal Medicine

## 2024-02-25 ENCOUNTER — Other Ambulatory Visit: Payer: Self-pay

## 2024-02-25 ENCOUNTER — Encounter (HOSPITAL_COMMUNITY): Payer: Self-pay

## 2024-02-25 DIAGNOSIS — Z9081 Acquired absence of spleen: Secondary | ICD-10-CM

## 2024-02-25 DIAGNOSIS — Z79899 Other long term (current) drug therapy: Secondary | ICD-10-CM

## 2024-02-25 DIAGNOSIS — Z8261 Family history of arthritis: Secondary | ICD-10-CM

## 2024-02-25 DIAGNOSIS — E872 Acidosis, unspecified: Secondary | ICD-10-CM | POA: Diagnosis present

## 2024-02-25 DIAGNOSIS — D638 Anemia in other chronic diseases classified elsewhere: Secondary | ICD-10-CM | POA: Diagnosis present

## 2024-02-25 DIAGNOSIS — Z841 Family history of disorders of kidney and ureter: Secondary | ICD-10-CM

## 2024-02-25 DIAGNOSIS — G894 Chronic pain syndrome: Secondary | ICD-10-CM | POA: Diagnosis present

## 2024-02-25 DIAGNOSIS — D5700 Hb-SS disease with crisis, unspecified: Secondary | ICD-10-CM | POA: Diagnosis not present

## 2024-02-25 DIAGNOSIS — Z8249 Family history of ischemic heart disease and other diseases of the circulatory system: Secondary | ICD-10-CM

## 2024-02-25 DIAGNOSIS — F1120 Opioid dependence, uncomplicated: Secondary | ICD-10-CM | POA: Diagnosis present

## 2024-02-25 DIAGNOSIS — Z8269 Family history of other diseases of the musculoskeletal system and connective tissue: Secondary | ICD-10-CM

## 2024-02-25 DIAGNOSIS — Z832 Family history of diseases of the blood and blood-forming organs and certain disorders involving the immune mechanism: Secondary | ICD-10-CM

## 2024-02-25 DIAGNOSIS — Z87891 Personal history of nicotine dependence: Secondary | ICD-10-CM

## 2024-02-25 DIAGNOSIS — Z888 Allergy status to other drugs, medicaments and biological substances status: Secondary | ICD-10-CM

## 2024-02-25 LAB — CBC WITH DIFFERENTIAL/PLATELET
Abs Immature Granulocytes: 0.03 10*3/uL (ref 0.00–0.07)
Basophils Absolute: 0 10*3/uL (ref 0.0–0.1)
Basophils Relative: 0 %
Eosinophils Absolute: 0 10*3/uL (ref 0.0–0.5)
Eosinophils Relative: 0 %
HCT: 29.1 % — ABNORMAL LOW (ref 36.0–46.0)
Hemoglobin: 10.4 g/dL — ABNORMAL LOW (ref 12.0–15.0)
Immature Granulocytes: 0 %
Lymphocytes Relative: 22 %
Lymphs Abs: 2 10*3/uL (ref 0.7–4.0)
MCH: 30 pg (ref 26.0–34.0)
MCHC: 35.7 g/dL (ref 30.0–36.0)
MCV: 83.9 fL (ref 80.0–100.0)
Monocytes Absolute: 0.9 10*3/uL (ref 0.1–1.0)
Monocytes Relative: 10 %
Neutro Abs: 5.9 10*3/uL (ref 1.7–7.7)
Neutrophils Relative %: 68 %
Platelets: 596 10*3/uL — ABNORMAL HIGH (ref 150–400)
RBC: 3.47 MIL/uL — ABNORMAL LOW (ref 3.87–5.11)
RDW: 13.4 % (ref 11.5–15.5)
WBC: 8.9 10*3/uL (ref 4.0–10.5)
nRBC: 0 % (ref 0.0–0.2)

## 2024-02-25 LAB — COMPREHENSIVE METABOLIC PANEL WITH GFR
ALT: 21 U/L (ref 0–44)
AST: 28 U/L (ref 15–41)
Albumin: 4.4 g/dL (ref 3.5–5.0)
Alkaline Phosphatase: 54 U/L (ref 38–126)
Anion gap: 11 (ref 5–15)
BUN: 8 mg/dL (ref 6–20)
CO2: 20 mmol/L — ABNORMAL LOW (ref 22–32)
Calcium: 9.5 mg/dL (ref 8.9–10.3)
Chloride: 107 mmol/L (ref 98–111)
Creatinine, Ser: 0.53 mg/dL (ref 0.44–1.00)
GFR, Estimated: >60 mL/min (ref >60)
Glucose, Bld: 87 mg/dL (ref 70–99)
Potassium: 3.6 mmol/L (ref 3.5–5.1)
Sodium: 138 mmol/L (ref 135–145)
Total Bilirubin: 1.5 mg/dL — ABNORMAL HIGH (ref 0.0–1.2)
Total Protein: 8.2 g/dL — ABNORMAL HIGH (ref 6.5–8.1)

## 2024-02-25 LAB — RETICULOCYTES
Immature Retic Fract: 28.7 % — ABNORMAL HIGH (ref 2.3–15.9)
RBC.: 3.45 MIL/uL — ABNORMAL LOW (ref 3.87–5.11)
Retic Count, Absolute: 144.9 10*3/uL (ref 19.0–186.0)
Retic Ct Pct: 4.2 % — ABNORMAL HIGH (ref 0.4–3.1)

## 2024-02-25 MED ORDER — SENNOSIDES-DOCUSATE SODIUM 8.6-50 MG PO TABS
1.0000 | ORAL_TABLET | Freq: Two times a day (BID) | ORAL | Status: DC
  Administered 2024-02-25 – 2024-02-29 (×8): 1 via ORAL
  Filled 2024-02-25 (×8): qty 1

## 2024-02-25 MED ORDER — KETOROLAC TROMETHAMINE 15 MG/ML IJ SOLN
15.0000 mg | Freq: Four times a day (QID) | INTRAMUSCULAR | Status: DC
  Administered 2024-02-25 – 2024-02-29 (×15): 15 mg via INTRAVENOUS
  Filled 2024-02-25 (×15): qty 1

## 2024-02-25 MED ORDER — HYDROMORPHONE HCL 1 MG/ML IJ SOLN
2.0000 mg | Freq: Once | INTRAMUSCULAR | Status: AC
  Administered 2024-02-25: 2 mg via INTRAVENOUS
  Filled 2024-02-25: qty 2

## 2024-02-25 MED ORDER — HYDROMORPHONE HCL 1 MG/ML IJ SOLN
INTRAMUSCULAR | Status: AC
Start: 2024-02-25 — End: 2024-02-26
  Filled 2024-02-25: qty 2

## 2024-02-25 MED ORDER — SODIUM CHLORIDE 0.45 % IV SOLN: INTRAVENOUS | Status: DC

## 2024-02-25 MED ORDER — MORPHINE SULFATE ER 15 MG PO TBCR
60.0000 mg | EXTENDED_RELEASE_TABLET | Freq: Two times a day (BID) | ORAL | Status: DC
  Administered 2024-02-25 – 2024-02-29 (×8): 60 mg via ORAL
  Filled 2024-02-25 (×8): qty 4

## 2024-02-25 MED ORDER — ONDANSETRON HCL 4 MG/2ML IJ SOLN
4.0000 mg | INTRAMUSCULAR | Status: DC | PRN
  Administered 2024-02-25: 4 mg via INTRAVENOUS
  Filled 2024-02-25: qty 2

## 2024-02-25 MED ORDER — HYDROMORPHONE HCL 1 MG/ML IJ SOLN: 2.0000 mg | Freq: Once | INTRAMUSCULAR | Status: DC

## 2024-02-25 MED ORDER — HYDROMORPHONE HCL 1 MG/ML IJ SOLN
2.0000 mg | INTRAMUSCULAR | Status: AC
  Administered 2024-02-25: 2 mg via INTRAVENOUS

## 2024-02-25 MED ORDER — HYDROMORPHONE HCL 1 MG/ML IJ SOLN: 2.0000 mg | INTRAMUSCULAR | Status: DC

## 2024-02-25 MED ORDER — SODIUM CHLORIDE 0.9% FLUSH: 9.0000 mL | INTRAVENOUS | Status: DC | PRN

## 2024-02-25 MED ORDER — HYDROMORPHONE HCL 1 MG/ML IJ SOLN
2.0000 mg | Freq: Once | INTRAMUSCULAR | Status: AC
  Administered 2024-02-25: 2 mg via INTRAVENOUS

## 2024-02-25 MED ORDER — DIPHENHYDRAMINE HCL 25 MG PO CAPS: 25.0000 mg | ORAL_CAPSULE | ORAL | Status: DC | PRN

## 2024-02-25 MED ORDER — NALOXONE HCL 0.4 MG/ML IJ SOLN: 0.4000 mg | INTRAMUSCULAR | Status: DC | PRN

## 2024-02-25 MED ORDER — SODIUM CHLORIDE 0.45 % IV SOLN: INTRAVENOUS | Status: AC

## 2024-02-25 MED ORDER — HYDROMORPHONE HCL 1 MG/ML IJ SOLN
2.0000 mg | INTRAMUSCULAR | Status: DC
  Filled 2024-02-25: qty 2

## 2024-02-25 MED ORDER — HYDROMORPHONE 1 MG/ML IV SOLN
INTRAVENOUS | Status: DC
  Administered 2024-02-25: 30 mg via INTRAVENOUS
  Administered 2024-02-26: 4 mg via INTRAVENOUS
  Administered 2024-02-26: 6.5 mg via INTRAVENOUS
  Administered 2024-02-26: 4 mg via INTRAVENOUS
  Administered 2024-02-26: 6 mg via INTRAVENOUS
  Administered 2024-02-26: 4.5 mg via INTRAVENOUS
  Administered 2024-02-26: 5 mg via INTRAVENOUS
  Administered 2024-02-26 – 2024-02-27 (×2): 30 mg via INTRAVENOUS
  Administered 2024-02-27: 3.5 mg via INTRAVENOUS
  Administered 2024-02-27: 7.5 mg via INTRAVENOUS
  Administered 2024-02-27: 5 mg via INTRAVENOUS
  Administered 2024-02-27: 5.5 mg via INTRAVENOUS
  Administered 2024-02-27: 5 mg via INTRAVENOUS
  Administered 2024-02-27: 9 mg via INTRAVENOUS
  Administered 2024-02-28: 7.5 mg via INTRAVENOUS
  Administered 2024-02-28: 8 mg via INTRAVENOUS
  Administered 2024-02-28: 7 mg via INTRAVENOUS
  Administered 2024-02-28: 7.5 mg via INTRAVENOUS
  Administered 2024-02-28: 4 mg via INTRAVENOUS
  Administered 2024-02-28: 5 mg via INTRAVENOUS
  Administered 2024-02-28: 60 mg via INTRAVENOUS
  Administered 2024-02-29: 7.5 mg via INTRAVENOUS
  Administered 2024-02-29: 4.5 mg via INTRAVENOUS
  Administered 2024-02-29: 30 mg via INTRAVENOUS
  Filled 2024-02-25 (×5): qty 30

## 2024-02-25 MED ORDER — ENOXAPARIN SODIUM 40 MG/0.4ML IJ SOSY
40.0000 mg | PREFILLED_SYRINGE | INTRAMUSCULAR | Status: DC
  Filled 2024-02-25: qty 0.4

## 2024-02-25 MED ORDER — ONDANSETRON HCL 4 MG PO TABS: 4.0000 mg | ORAL_TABLET | ORAL | Status: DC | PRN

## 2024-02-25 MED ORDER — POLYETHYLENE GLYCOL 3350 17 G PO PACK: 17.0000 g | PACK | Freq: Every day | ORAL | Status: DC | PRN

## 2024-02-25 MED ORDER — ONDANSETRON HCL 4 MG/2ML IJ SOLN
4.0000 mg | INTRAMUSCULAR | Status: DC | PRN
  Administered 2024-02-26 – 2024-02-28 (×5): 4 mg via INTRAVENOUS
  Filled 2024-02-25 (×5): qty 2

## 2024-02-25 MED ORDER — HYDROMORPHONE HCL 1 MG/ML IJ SOLN
2.0000 mg | INTRAMUSCULAR | Status: DC | PRN
  Administered 2024-02-25: 2 mg via INTRAVENOUS
  Filled 2024-02-25: qty 2

## 2024-02-25 MED ORDER — KETOROLAC TROMETHAMINE 15 MG/ML IJ SOLN
15.0000 mg | INTRAMUSCULAR | Status: AC
  Administered 2024-02-25: 15 mg via INTRAVENOUS
  Filled 2024-02-25: qty 1

## 2024-02-25 NOTE — ED Triage Notes (Addendum)
 Patient began having sickle cell pain crisis since 4/21. Pain in middle of her back and into both of her hips. Has abdominal cramping and nausea. Took 20mg  oxycodone  at 4am and 60mg  morphine  oral at 10:30pm last night.

## 2024-02-25 NOTE — Plan of Care (Signed)

## 2024-02-25 NOTE — H&P (Signed)
 History and Physical    Carmen [name] DOB: April 30, 1991 DOA: 02/25/2024  PCP: Paseda, Folashade R, FNP  Patient coming from: Home  I have personally briefly reviewed patient's old medical records in [facility] Link  Chief Complaint: Sickle cell pain crisis  HPI: Carmen [name] is a 33 y.o. female with medical history significant for sickle cell disease, chronic pain syndrome, anemia of chronic disease, depression who presented to the ED for evaluation of back and hip pain similar to her sickle cell pain crises.  Patient reports typical sickle cell pain flareup beginning 4/21.  She has been having mid to lower back pain and bilateral hip pain.  She has not had improvement despite her home pain regimen including MS Contin  60 mg every 12 hours and oxycodone  10 mg every 6 hours as needed.  She says she called the sickle cell clinics here and at [facility] but was unable to get seen in clinic she had some abdominal cramping pain earlier which has resolved.  Denies chest pain, dyspnea, lower extremity swelling.  ED Course  Labs/Imaging on admission: I have personally reviewed following labs and imaging studies.  Initial vitals showed BP 133/84, pulse 104, RR 20, temp 99.0 F, SpO2 98% on room air.  Labs showed sodium 138, potassium 3.6, bicarb 20, BUN 8, creatinine 0.53, serum glucose 87, WBC 8.9, hemoglobin 10.4, platelets 596.  Patient was given IV Dilaudid  2 mg x 3, IV Toradol  15 mg with persistent pain.  The hospitalist service was consulted to admit.  Review of Systems: All systems reviewed and are negative except as documented in history of present illness above.   Past Medical History:  Diagnosis Date   Anxiety    Headache(784.0)    Heart murmur    Sickle cell crisis (HCC)    Syphilis 2015   Was diagnosed and received one injection of antibiotics   Thrombocytosis 11/22/2014     CBC Latest Ref Rng & Units 12/13/2018 12/11/2018 12/10/2018 WBC 4.0 - 10.5  K/uL 14.8(H) 13.2(H) 15.9(H) Hemoglobin 12.0 - 15.0 g/dL 8.2(L) 7.7(L) 8.1(L) Hematocrit 36.0 - 46.0 % 23.7(L) 23.2(L) 24.5(L) Platelets 150 - 400 K/uL 326 399 388      Past Surgical History:  Procedure Laterality Date   CESAREAN SECTION N/A 02/05/2019   Procedure: CESAREAN SECTION;  Surgeon: Othelia Blinks, MD;  Location: [facility] ORS;  Service: Obstetrics;  Laterality: N/A;   CHOLECYSTECTOMY N/A 11/30/2014   Procedure: LAPAROSCOPIC CHOLECYSTECTOMY SINGLE SITE WITH INTRAOPERATIVE CHOLANGIOGRAM;  Surgeon: Candyce Champagne, MD;  Location: [facility] ORS;  Service: General;  Laterality: N/A;   SPLENECTOMY      Social History: Social History   Tobacco Use   Smoking status: Former    Types: Cigarettes   Smokeless tobacco: Never  Vaping Use   Vaping status: Never Used  Substance Use Topics   Alcohol use: No   Drug use: Yes    Comment: marijuana   Allergies  Allergen Reactions   Ketamine  Hives, Nausea And Vomiting and Other (See Comments)    Makes the patient "feel funny" also    Family History  Problem Relation Age of Onset   Hypertension Mother    Sickle cell anemia Sister    Kidney disease Sister        Lupus   Arthritis Sister    Sickle cell anemia Sister    Sickle cell trait Sister    Heart disease Maternal Aunt        CABG  Heart disease Maternal Uncle        CABG   Lupus Sister      Prior to Admission medications   Medication Sig Start Date End Date Taking? Authorizing Provider  BENADRYL  ALLERGY 25 MG capsule Take 25 mg by mouth every 6 (six) hours as needed for allergies.    [provider]  cyclobenzaprine  (FLEXERIL ) 10 MG tablet Take 1 tablet (10 mg total) by mouth at bedtime. TAKE 1 TABLET BY MOUTH DAILY AS NEEDED FOR MUSCLE SPASMS. 01/16/24   Paseda, Folashade R, FNP  folic acid  (FOLVITE ) 1 MG tablet Take 1 tablet (1 mg total) by mouth daily. 12/27/23   Paseda, Folashade R, FNP  hydroxyurea  (HYDREA ) 500 MG capsule Take 1 capsule (500 mg total) by mouth  daily. Patient not taking: Reported on 01/29/2024 12/27/23   Paseda, Folashade R, FNP  morphine  (MS CONTIN ) 60 MG 12 hr tablet Take 1 tablet (60 mg total) by mouth every 12 (twelve) hours. 01/29/24 02/28/24  Paseda, Folashade R, FNP  nicotine  (NICODERM CQ  - DOSED IN MG/24 HR) 7 mg/24hr patch Place 1 patch (7 mg total) onto the skin daily. Patient not taking: Reported on 01/29/2024 02/08/24   Paseda, Folashade R, FNP  Oxycodone  HCl 10 MG TABS Take 1 tablet (10 mg total) by mouth every 6 (six) hours as needed (pain). 02/12/24   Jerrlyn Morel, NP  Vitamin D , Ergocalciferol , (DRISDOL ) 1.25 MG (50000 UNIT) CAPS capsule TAKE 1 CAPSULE BY MOUTH ONE TIME PER WEEK 12/27/23   Paseda, Folashade R, FNP    Physical Exam: Vitals:   02/25/24 1545 02/25/24 1652 02/25/24 1821 02/25/24 1900  BP: 112/71  119/80 123/74  Pulse: 79  67 (!) 49  Resp:   18 16  Temp:  98.6 F (37 C)    TempSrc:  Oral    SpO2: 98%  99% 100%  Weight:      Height:       Constitutional: Resting in bed, NAD, calm, comfortable Eyes: EOMI, lids and conjunctivae normal ENMT: Mucous membranes are moist. Posterior pharynx clear of any exudate or lesions.Normal dentition.  Neck: normal, supple, no masses. Respiratory: clear to auscultation bilaterally, no wheezing, no crackles. Normal respiratory effort. No accessory muscle use.  Cardiovascular: Regular rate and rhythm, no murmurs / rubs / gallops. No extremity edema. 2+ pedal pulses. Abdomen: no tenderness, no masses palpated. Musculoskeletal: no clubbing / cyanosis. No joint deformity upper and lower extremities. Good ROM, no contractures. Normal muscle tone.  Skin: no rashes, lesions, ulcers. No induration Neurologic: Sensation intact. Strength 5/5 in all 4.  Psychiatric: Normal judgment and insight. Alert and oriented x 3. Normal mood.   EKG: Not performed.  Assessment/Plan Principal Problem:   Sickle cell pain crisis [facility])   Carmen [name] is a 33 y.o. female with medical history  significant for sickle cell disease, chronic pain syndrome, anemia of chronic disease, depression who is admitted for sickle cell pain crisis.  Assessment and Plan: Sickle cell pain crisis: Persistent mid to lower back pain and bilateral hip pain despite initial management in the ED.  Hemoglobin stable at 10.4. - Start Dilaudid  PCA pump - Continue home morphine  60 mg every 12 hours - Continue IVF 0.45% saline at 100 mL/hour - IV Toradol  15 mg every 6 hours   DVT prophylaxis: enoxaparin  (LOVENOX ) injection 40 mg Start: 02/25/24 2015 Code Status: Full code Family Communication: Discussed with patient, she has discussed with family Disposition Plan: From home and likely return home  pending clinical progress Consults called: None Severity of Illness: The appropriate patient status for this patient is OBSERVATION. Observation status is judged to be reasonable and necessary in order to provide the required intensity of service to ensure the patient's safety. The patient's presenting symptoms, physical exam findings, and initial radiographic and laboratory data in the context of their medical condition is felt to place them at decreased risk for further clinical deterioration. Furthermore, it is anticipated that the patient will be medically stable for discharge from the hospital within 2 midnights of admission.   Edith Gores MD [facility]  If 7PM-7AM, please contact night-coverage [email or web address]  02/25/2024, 8:10 PM

## 2024-02-25 NOTE — Hospital Course (Signed)
 Carmen [name] is a 33 y.o. female with medical history significant for sickle cell disease, chronic pain syndrome, anemia of chronic disease, depression who is admitted for sickle cell pain crisis.  On presentation vital stable, labs with mild metabolic acidosis.  Patient was started on Dilaudid  PCA pump.  4/27: Vital stable, continue to have significant pain and requiring PCA pump.  4/28: Vital stable, continue to have significant pain and pushing pump frequently.  She was also feeling weak, started her cycle with heavy bleeding, she does not want any medication to decrease the bleeding at this time. Discussed with sickle cell team and they will take over from here.

## 2024-02-25 NOTE — ED Provider Notes (Signed)
 [place] EMERGENCY DEPARTMENT AT [facility] Provider Note   CSN: [id number] Arrival date & time: 02/25/24  2841     History  Chief Complaint  Patient presents with   Sickle Cell Pain Crisis    Carmen [name] is a 33 y.o. female.  Patient with history of sickle cell disease presents today with complaints of sickle cell pain crisis. She states that same began on 4/21 and has been persistent since then. She has been attempting to manage her symptoms with her home meds with minimal improvement. She states that she has tried to get into the infusion clinic both here and at [facility] without success and was told by her pcp if her pain was uncontrolled to come to the ER for evaluation. She presents for same. Endorses cramping lower pelvic pain as well as pain in her back and bilateral lower leg pain consistent with her sickle cell crisis pain. Denies fevers, chills, chest pain, shortness of breath.   The history is provided by the patient. No language interpreter was used.  Sickle Cell Pain Crisis      Home Medications Prior to Admission medications   Medication Sig Start Date End Date Taking? Authorizing Provider  BENADRYL  ALLERGY 25 MG capsule Take 25 mg by mouth every 6 (six) hours as needed for allergies.    [provider]  cyclobenzaprine  (FLEXERIL ) 10 MG tablet Take 1 tablet (10 mg total) by mouth at bedtime. TAKE 1 TABLET BY MOUTH DAILY AS NEEDED FOR MUSCLE SPASMS. 01/16/24   Paseda, Folashade R, FNP  folic acid  (FOLVITE ) 1 MG tablet Take 1 tablet (1 mg total) by mouth daily. 12/27/23   Paseda, Folashade R, FNP  hydroxyurea  (HYDREA ) 500 MG capsule Take 1 capsule (500 mg total) by mouth daily. Patient not taking: Reported on 01/29/2024 12/27/23   Paseda, Folashade R, FNP  morphine  (MS CONTIN ) 60 MG 12 hr tablet Take 1 tablet (60 mg total) by mouth every 12 (twelve) hours. 01/29/24 02/28/24  Paseda, Folashade R, FNP  nicotine  (NICODERM CQ  - DOSED IN MG/24 HR) 7 mg/24hr  patch Place 1 patch (7 mg total) onto the skin daily. Patient not taking: Reported on 01/29/2024 02/08/24   Paseda, Folashade R, FNP  Oxycodone  HCl 10 MG TABS Take 1 tablet (10 mg total) by mouth every 6 (six) hours as needed (pain). 02/12/24   Jerrlyn Morel, NP  Vitamin D , Ergocalciferol , (DRISDOL ) 1.25 MG (50000 UNIT) CAPS capsule TAKE 1 CAPSULE BY MOUTH ONE TIME PER WEEK 12/27/23   Paseda, Folashade R, FNP      Allergies    Ketamine     Review of Systems   Review of Systems  All other systems reviewed and are negative.   Physical Exam Updated Vital Signs BP 113/83   Pulse 92   Temp 98.6 F (37 C) (Oral)   Resp 20   Ht 5\' 3"  (1.6 m)   Wt 83 kg   SpO2 100%   BMI 32.42 kg/m  Physical Exam Vitals and nursing note reviewed.  Constitutional:      General: She is not in acute distress.    Appearance: Normal appearance. She is normal weight. She is not ill-appearing, toxic-appearing or diaphoretic.  HENT:     Head: Normocephalic and atraumatic.  Cardiovascular:     Rate and Rhythm: Normal rate and regular rhythm.     Heart sounds: Normal heart sounds.  Pulmonary:     Effort: Pulmonary effort is normal. No respiratory distress.  Breath sounds: Normal breath sounds.  Abdominal:     General: Abdomen is flat.     Palpations: Abdomen is soft.     Tenderness: There is no abdominal tenderness.  Musculoskeletal:        General: Normal range of motion.     Cervical back: Normal range of motion.  Skin:    General: Skin is warm and dry.  Neurological:     General: No focal deficit present.     Mental Status: She is alert.  Psychiatric:        Mood and Affect: Mood normal.        Behavior: Behavior normal.     ED Results / Procedures / Treatments   Labs (all labs ordered are listed, but only abnormal results are displayed) Labs Reviewed  CBC WITH DIFFERENTIAL/PLATELET - Abnormal; Notable for the following components:      Result Value   RBC 3.47 (*)    Hemoglobin 10.4  (*)    HCT 29.1 (*)    Platelets 596 (*)    All other components within normal limits  RETICULOCYTES  COMPREHENSIVE METABOLIC PANEL WITH GFR    EKG None  Radiology No results found.  Procedures Procedures    Medications Ordered in ED Medications  0.45 % sodium chloride  infusion ( Intravenous New Bag/Given 02/25/24 1335)  ondansetron  (ZOFRAN ) injection 4 mg (4 mg Intravenous Given 02/25/24 1337)  HYDROmorphone  (DILAUDID ) injection 2 mg (2 mg Intravenous Not Given 02/25/24 1349)  HYDROmorphone  (DILAUDID ) injection 2 mg (2 mg Intravenous Not Given 02/25/24 1349)  ketorolac  (TORADOL ) 15 MG/ML injection 15 mg (15 mg Intravenous Given 02/25/24 1337)  HYDROmorphone  (DILAUDID ) injection 2 mg (2 mg Intravenous Given 02/25/24 1342)    ED Course/ Medical Decision Making/ A&P                                 Medical Decision Making Amount and/or Complexity of Data Reviewed Labs: ordered.  Risk Prescription drug management.   This patient is a 33 y.o. female who presents to the ED for concern of sickle cell crisis pain, this involves an extensive number of treatment options, and is a complaint that carries with it a high risk of complications and morbidity. The emergent differential diagnosis prior to evaluation includes, but is not limited to,  opioid dependence, sickle cell crisis pain . This is not an exhaustive differential.   Past Medical History / Co-morbidities / Social History:  has a past medical history of Anxiety, Headache(784.0), Heart murmur, Sickle cell crisis (HCC), Syphilis (2015), and Thrombocytosis (11/22/2014).  Additional history: Chart reviewed. Pertinent results include: admitted for sickle cell crisis pain on 3/30  Physical Exam: Physical exam performed. The pertinent findings include: No acute physical exam abnormalities  Lab Tests: I ordered, and personally interpreted labs.  The pertinent results include:  hgb 10.4 consistent with previous, reticulocyte count  elevated    Medications: I ordered medication including fluids, dilaudid , toradol , zofran   for pain. Reevaluation of the patient after these medicines showed that the patient improved. I have reviewed the patients home medicines and have made adjustments as needed.   Disposition: After consideration of the diagnostic results and the patients response to treatment, I feel that patient will require admission for sickle cell crisis.  After above interventions, she is still having pain and is requesting admission.  Discussed patient with hospitalist Dr. Lydia Sams who accepts patient for admission.  Final  Clinical Impression(s) / ED Diagnoses Final diagnoses:  Sickle cell pain crisis [facility])    Rx / DC Orders ED Discharge Orders     None         Fredna Jasper 02/25/24 1848    Arvilla Birmingham, MD 02/26/24 [phone]

## 2024-02-26 DIAGNOSIS — E872 Acidosis, unspecified: Secondary | ICD-10-CM | POA: Diagnosis present

## 2024-02-26 DIAGNOSIS — Z8261 Family history of arthritis: Secondary | ICD-10-CM | POA: Diagnosis not present

## 2024-02-26 DIAGNOSIS — Z832 Family history of diseases of the blood and blood-forming organs and certain disorders involving the immune mechanism: Secondary | ICD-10-CM | POA: Diagnosis not present

## 2024-02-26 DIAGNOSIS — Z9081 Acquired absence of spleen: Secondary | ICD-10-CM | POA: Diagnosis not present

## 2024-02-26 DIAGNOSIS — D638 Anemia in other chronic diseases classified elsewhere: Secondary | ICD-10-CM | POA: Diagnosis present

## 2024-02-26 DIAGNOSIS — Z87891 Personal history of nicotine dependence: Secondary | ICD-10-CM | POA: Diagnosis not present

## 2024-02-26 DIAGNOSIS — D5700 Hb-SS disease with crisis, unspecified: Secondary | ICD-10-CM | POA: Diagnosis present

## 2024-02-26 DIAGNOSIS — Z8249 Family history of ischemic heart disease and other diseases of the circulatory system: Secondary | ICD-10-CM | POA: Diagnosis not present

## 2024-02-26 DIAGNOSIS — Z841 Family history of disorders of kidney and ureter: Secondary | ICD-10-CM | POA: Diagnosis not present

## 2024-02-26 DIAGNOSIS — F1120 Opioid dependence, uncomplicated: Secondary | ICD-10-CM | POA: Diagnosis present

## 2024-02-26 DIAGNOSIS — Z8269 Family history of other diseases of the musculoskeletal system and connective tissue: Secondary | ICD-10-CM | POA: Diagnosis not present

## 2024-02-26 DIAGNOSIS — Z888 Allergy status to other drugs, medicaments and biological substances status: Secondary | ICD-10-CM | POA: Diagnosis not present

## 2024-02-26 DIAGNOSIS — G894 Chronic pain syndrome: Secondary | ICD-10-CM | POA: Diagnosis present

## 2024-02-26 DIAGNOSIS — Z79899 Other long term (current) drug therapy: Secondary | ICD-10-CM | POA: Diagnosis not present

## 2024-02-26 LAB — CMP14+CBC/D/PLT+FER+RETIC+V...
ALT: 14 IU/L (ref 0–32)
AST: 20 IU/L (ref 0–40)
Albumin: 4.4 g/dL (ref 3.9–4.9)
Alkaline Phosphatase: 74 IU/L (ref 44–121)
BUN/Creatinine Ratio: 13 (ref 9–23)
BUN: 8 mg/dL (ref 6–20)
Basophils Absolute: 0.1 10*3/uL (ref 0.0–0.2)
Basos: 1 %
Bilirubin Total: 1.1 mg/dL (ref 0.0–1.2)
CO2: 20 mmol/L (ref 20–29)
Calcium: 9.6 mg/dL (ref 8.7–10.2)
Chloride: 105 mmol/L (ref 96–106)
Creatinine, Ser: 0.6 mg/dL (ref 0.57–1.00)
EOS (ABSOLUTE): 0.2 10*3/uL (ref 0.0–0.4)
Eos: 3 %
Ferritin: 371 ng/mL — ABNORMAL HIGH (ref 15–150)
Globulin, Total: 3.2 g/dL (ref 1.5–4.5)
Glucose: 88 mg/dL (ref 70–99)
Hematocrit: 32.7 % — ABNORMAL LOW (ref 34.0–46.6)
Hemoglobin: 10.4 g/dL — ABNORMAL LOW (ref 11.1–15.9)
Immature Grans (Abs): 0 10*3/uL (ref 0.0–0.1)
Immature Granulocytes: 0 %
Lymphocytes Absolute: 2.9 10*3/uL (ref 0.7–3.1)
Lymphs: 30 %
MCH: 29.3 pg (ref 26.6–33.0)
MCHC: 31.8 g/dL (ref 31.5–35.7)
MCV: 92 fL (ref 79–97)
Monocytes Absolute: 1 10*3/uL — ABNORMAL HIGH (ref 0.1–0.9)
Monocytes: 10 %
Neutrophils Absolute: 5.4 10*3/uL (ref 1.4–7.0)
Neutrophils: 56 %
Platelets: 583 10*3/uL — ABNORMAL HIGH (ref 150–450)
Potassium: 4.4 mmol/L (ref 3.5–5.2)
RBC: 3.55 x10E6/uL — ABNORMAL LOW (ref 3.77–5.28)
RDW: 14.8 % (ref 11.7–15.4)
Retic Ct Pct: 4.5 % — ABNORMAL HIGH (ref 0.6–2.6)
Sodium: 140 mmol/L (ref 134–144)
Total Protein: 7.6 g/dL (ref 6.0–8.5)
Vit D, 25-Hydroxy: 38.4 ng/mL (ref 30.0–100.0)
WBC: 9.5 10*3/uL (ref 3.4–10.8)
eGFR: 123 mL/min/{1.73_m2} (ref >59)

## 2024-02-26 LAB — CBC
HCT: 27.3 % — ABNORMAL LOW (ref 36.0–46.0)
Hemoglobin: 9.7 g/dL — ABNORMAL LOW (ref 12.0–15.0)
MCH: 29.9 pg (ref 26.0–34.0)
MCHC: 35.5 g/dL (ref 30.0–36.0)
MCV: 84.3 fL (ref 80.0–100.0)
Platelets: 485 10*3/uL — ABNORMAL HIGH (ref 150–400)
RBC: 3.24 MIL/uL — ABNORMAL LOW (ref 3.87–5.11)
RDW: 13.5 % (ref 11.5–15.5)
WBC: 9.2 10*3/uL (ref 4.0–10.5)
nRBC: 0 % (ref 0.0–0.2)

## 2024-02-26 LAB — BASIC METABOLIC PANEL WITH GFR
Anion gap: 9 (ref 5–15)
BUN: 10 mg/dL (ref 6–20)
CO2: 23 mmol/L (ref 22–32)
Calcium: 9 mg/dL (ref 8.9–10.3)
Chloride: 104 mmol/L (ref 98–111)
Creatinine, Ser: 0.36 mg/dL — ABNORMAL LOW (ref 0.44–1.00)
GFR, Estimated: >60 mL/min (ref >60)
Glucose, Bld: 98 mg/dL (ref 70–99)
Potassium: 3.6 mmol/L (ref 3.5–5.1)
Sodium: 136 mmol/L (ref 135–145)

## 2024-02-26 NOTE — Assessment & Plan Note (Signed)
 Persistent mid to lower back pain and bilateral hip pain despite initial management in the ED. Hemoglobin stable at 10.4.  Continue to required frequent dosing from PCA pump. - Continue with Dilaudid  PCA pump -Continue with home morphine  60 mg twice daily -Continue with IV Toradol  -Continue with supportive care

## 2024-02-26 NOTE — Progress Notes (Signed)
   02/26/24 1055  TOC Brief Assessment  Insurance and Status Reviewed  Patient has primary care physician Yes  Home environment has been reviewed return home  Prior level of function: independent  Prior/Current Home Services No current home services  Social Drivers of Health Review SDOH reviewed no interventions necessary  Readmission risk has been reviewed Yes  Transition of care needs no transition of care needs at this time

## 2024-02-26 NOTE — Progress Notes (Signed)
  Progress Note   Patient: Carmen [name] MWN:[id number] DOB: 07/03/1991 DOA: 02/25/2024     0 DOS: the patient was seen and examined on 02/26/2024   Brief hospital course: NATAJA SIUDA is a 33 y.o. female with medical history significant for sickle cell disease, chronic pain syndrome, anemia of chronic disease, depression who is admitted for sickle cell pain crisis.  On presentation vital stable, labs with mild metabolic acidosis.  Patient was started on Dilaudid  PCA pump.  4/27: Vital stable, continue to have significant pain and requiring PCA pump.  Assessment and Plan: * Sickle cell pain crisis (HCC) Persistent mid to lower back pain and bilateral hip pain despite initial management in the ED. Hemoglobin stable at 10.4.  Continue to required frequent dosing from PCA pump. - Continue with Dilaudid  PCA pump -Continue with home morphine  60 mg twice daily -Continue with IV Toradol  -Continue with supportive care   Subjective: Patient was seen and examined today.  Continued to have 7/10 pain despite using Dilaudid  PCA pump each time it turns green.  Patient is a single mom with worsening pain over the past couple of weeks.  Physical Exam: Vitals:   02/26/24 0035 02/26/24 0354 02/26/24 0404 02/26/24 0747  BP: 111/63  (!) 95/59   Pulse: (!) 56  (!) 52   Resp: 17 12 17 13   Temp: 98 F (36.7 C)  97.6 F (36.4 C)   TempSrc: Oral  Oral   SpO2: 99%  99%   Weight:      Height:       General.  Overweight lady, in no acute distress. Pulmonary.  Lungs clear bilaterally, normal respiratory effort. CV.  Regular rate and rhythm, no JVD, rub or murmur. Abdomen.  Soft, nontender, nondistended, BS positive. CNS.  Alert and oriented .  No focal neurologic deficit. Extremities.  No edema, no cyanosis, pulses intact and symmetrical. Psychiatry.  Judgment and insight appears normal.   Data Reviewed: Prior data reviewed  Family Communication: Discussed with patient  Disposition: Status is:  Observation The patient will require care spanning > 2 midnights and should be moved to inpatient because: Severity of illness  Planned Discharge Destination: Home  DVT prophylaxis.  Lovenox  Time spent: 45 minutes  This record has been created using Conservation officer, historic buildings. Errors have been sought and corrected,but may not always be located. Such creation errors do not reflect on the standard of care.   Author: Luna Salinas, MD 02/26/2024 11:02 AM  For on call review [email or web address].

## 2024-02-26 NOTE — Plan of Care (Signed)

## 2024-02-26 NOTE — Plan of Care (Signed)
  Problem: Education: Goal: Knowledge of General Education information will improve Description: Including pain rating scale, medication(s)/side effects and non-pharmacologic comfort measures Outcome: Progressing   Problem: Health Behavior/Discharge Planning: Goal: Ability to manage health-related needs will improve Outcome: Progressing   Problem: Clinical Measurements: Goal: Ability to maintain clinical measurements within normal limits will improve Outcome: Progressing Goal: Will remain free from infection Outcome: Progressing Goal: Diagnostic test results will improve Outcome: Progressing Goal: Respiratory complications will improve Outcome: Progressing Goal: Cardiovascular complication will be avoided Outcome: Progressing   Problem: Nutrition: Goal: Adequate nutrition will be maintained Outcome: Progressing   Problem: Activity: Goal: Risk for activity intolerance will decrease Outcome: Progressing   Problem: Coping: Goal: Level of anxiety will decrease Outcome: Progressing   Problem: Elimination: Goal: Will not experience complications related to bowel motility Outcome: Progressing Goal: Will not experience complications related to urinary retention Outcome: Progressing   Problem: Pain Managment: Goal: General experience of comfort will improve and/or be controlled Outcome: Progressing   Problem: Safety: Goal: Ability to remain free from injury will improve Outcome: Progressing   Problem: Skin Integrity: Goal: Risk for impaired skin integrity will decrease Outcome: Progressing   Problem: Education: Goal: Knowledge of vaso-occlusive preventative measures will improve Outcome: Progressing Goal: Awareness of infection prevention will improve Outcome: Progressing Goal: Awareness of signs and symptoms of anemia will improve Outcome: Progressing Goal: Long-term complications will improve Outcome: Progressing   Problem: Self-Care: Goal: Ability to  incorporate actions that prevent/reduce pain crisis will improve Outcome: Progressing   Problem: Bowel/Gastric: Goal: Gut motility will be maintained Outcome: Progressing   Problem: Tissue Perfusion: Goal: Complications related to inadequate tissue perfusion will be avoided or minimized Outcome: Progressing   Problem: Respiratory: Goal: Pulmonary complications will be avoided or minimized Outcome: Progressing Goal: Acute Chest Syndrome will be identified early to prevent complications Outcome: Progressing   Problem: Sensory: Goal: Pain level will decrease with appropriate interventions Outcome: Progressing   Problem: Health Behavior: Goal: Postive changes in compliance with treatment and prescription regimens will improve Outcome: Progressing   Problem: Fluid Volume: Goal: Ability to maintain a balanced intake and output will improve Outcome: Progressing

## 2024-02-27 DIAGNOSIS — D5700 Hb-SS disease with crisis, unspecified: Secondary | ICD-10-CM | POA: Diagnosis not present

## 2024-02-27 NOTE — Plan of Care (Signed)
  Problem: Education: Goal: Knowledge of General Education information will improve Description: Including pain rating scale, medication(s)/side effects and non-pharmacologic comfort measures Outcome: Progressing   Problem: Health Behavior/Discharge Planning: Goal: Ability to manage health-related needs will improve Outcome: Progressing   Problem: Clinical Measurements: Goal: Ability to maintain clinical measurements within normal limits will improve Outcome: Progressing Goal: Will remain free from infection Outcome: Progressing Goal: Diagnostic test results will improve Outcome: Progressing Goal: Respiratory complications will improve Outcome: Progressing Goal: Cardiovascular complication will be avoided Outcome: Progressing   Problem: Activity: Goal: Risk for activity intolerance will decrease Outcome: Progressing   Problem: Nutrition: Goal: Adequate nutrition will be maintained Outcome: Progressing   Problem: Coping: Goal: Level of anxiety will decrease Outcome: Progressing   Problem: Elimination: Goal: Will not experience complications related to bowel motility Outcome: Progressing Goal: Will not experience complications related to urinary retention Outcome: Progressing   Problem: Safety: Goal: Ability to remain free from injury will improve Outcome: Progressing   Problem: Skin Integrity: Goal: Risk for impaired skin integrity will decrease Outcome: Progressing   Problem: Education: Goal: Knowledge of vaso-occlusive preventative measures will improve Outcome: Progressing Goal: Awareness of infection prevention will improve Outcome: Progressing Goal: Awareness of signs and symptoms of anemia will improve Outcome: Progressing Goal: Long-term complications will improve Outcome: Progressing

## 2024-02-27 NOTE — Progress Notes (Signed)
 Patient ID: Carmen [name], female   DOB: 02-Jun-1991, 32 y.o.   MRN: [id number] Subjective: Carmen [name]  is a 33 y.o. female with medical history significant for sickle cell disease, chronic pain syndrome, opiate dependence and tolerance, anemia of chronic disease and major depressive disorder, who presented to the emergency department with worsening mid back and hip pain consistent with sickle cell pain in the past. Patient attributes her menstrual circle as a precipitating factor.  No numbness or weakness into her lower extremities.  Patient has been taking her home pain medication as prescribed but not getting relief of pain.  No chest pain or shortness of breath, recent fever or chills, cough or cold symptoms.  Patient is reporting continues pain of 6/10. She is crying this morning. We discussed the need to focus on getting well, and encouraged to ambulate.     Objective:  Vital signs in last 24 hours:  Vitals:   02/27/24 0327 02/27/24 0729 02/27/24 1156 02/27/24 1238  BP: 123/69   122/74  Pulse: 62   63  Resp: 15 16 12 18   Temp: 98.2 F (36.8 C)   98.1 F (36.7 C)  TempSrc: Oral   Oral  SpO2: 97%   95%  Weight:      Height:        Intake/Output from previous day:   Intake/Output Summary (Last 24 hours) at 02/27/2024 1320 Last data filed at 02/27/2024 0315 Gross per 24 hour  Intake 454 ml  Output --  Net 454 ml    Physical Exam: General: Alert, awake, oriented x3, in no acute distress.  HEENT: [place]/AT PEERL, EOMI Neck: Trachea midline,  no masses, no thyromegal,y no JVD, no carotid bruit OROPHARYNX:  Moist, No exudate/ erythema/lesions.  Heart: Regular rate and rhythm, without murmurs, rubs, gallops, PMI non-displaced, no heaves or thrills on palpation.  Lungs: Clear to auscultation, no wheezing or rhonchi noted. No increased vocal fremitus resonant to percussion  Abdomen: Soft, nontender, nondistended, positive bowel sounds, no masses no hepatosplenomegaly noted..  Neuro:  No focal neurological deficits noted cranial nerves II through XII grossly intact. DTRs 2+ bilaterally upper and lower extremities. Strength 5 out of 5 in bilateral upper and lower extremities.  Musculoskeletal: Lower back pain/bilateral hip pain  Psychiatric: Patient alert and oriented x3, good insight and cognition, good recent to remote recall. Lymph node survey: No cervical axillary or inguinal lymphadenopathy noted.  Lab Results:  Basic Metabolic Panel:    Component Value Date/Time   NA 136 02/26/2024 1035   NA 140 02/24/2024 0947   K 3.6 02/26/2024 1035   CL 104 02/26/2024 1035   CO2 23 02/26/2024 1035   BUN 10 02/26/2024 1035   BUN 8 02/24/2024 0947   CREATININE 0.36 (L) 02/26/2024 1035   GLUCOSE 98 02/26/2024 1035   CALCIUM  9.0 02/26/2024 1035   CBC:    Component Value Date/Time   WBC 9.2 02/26/2024 1132   HGB 9.7 (L) 02/26/2024 1132   HGB 10.4 (L) 02/24/2024 0947   HCT 27.3 (L) 02/26/2024 1132   HCT 32.7 (L) 02/24/2024 0947   PLT 485 (H) 02/26/2024 1132   PLT 583 (H) 02/24/2024 0947   MCV 84.3 02/26/2024 1132   MCV 92 02/24/2024 0947   NEUTROABS 5.9 02/25/2024 1328   NEUTROABS 5.4 02/24/2024 0947   LYMPHSABS 2.0 02/25/2024 1328   LYMPHSABS 2.9 02/24/2024 0947   MONOABS 0.9 02/25/2024 1328   EOSABS 0.0 02/25/2024 1328   EOSABS 0.2 02/24/2024 0947  BASOSABS 0.0 02/25/2024 1328   BASOSABS 0.1 02/24/2024 0947    No results found for this or any previous visit (from the past 240 hours).  Studies/Results: No results found.  Medications: Scheduled Meds:  enoxaparin  (LOVENOX ) injection  40 mg Subcutaneous Q24H   HYDROmorphone    Intravenous Q4H   ketorolac   15 mg Intravenous Q6H   morphine   60 mg Oral Q12H   senna-docusate  1 tablet Oral BID   Continuous Infusions: PRN Meds:.diphenhydrAMINE , naloxone  **AND** sodium chloride  flush, ondansetron  **OR** ondansetron  (ZOFRAN ) IV, polyethylene  glycol  Consultants: None  Procedures: None  Antibiotics: None  Assessment/Plan: Principal Problem:   Sickle cell pain crisis (HCC)   Hb Sickle Cell Disease with Pain crisis: Continue IVF 0.45% Saline @ KVO mls/hour, continue weight based Dilaudid  PCA, IV Toradol  15 mg Q 6 H for a total of 5 days, continue oral home pain medications as ordered. Monitor vitals very closely, Re-evaluate pain scale regularly, 2 L of Oxygen  by [place]. Patient encouraged to ambulate on the hallway today.  Anemia of Chronic Disease: HGB slightly below baseline, no need for transfusion. Will continue to monitor. Daily Labs in place.  Chronic pain Syndrome: Continue oral home medication.    Code Status: Full Code Family Communication: N/A Disposition Plan: Not yet ready for discharge  Lorel Roes NP If 7PM-7AM, please contact night-coverage.  02/27/2024, 1:20 PM  LOS: 1 day

## 2024-02-27 NOTE — Progress Notes (Signed)
  Progress Note   Patient: Carmen [name] ZOX:[id number] DOB: 1991/05/11 DOA: 02/25/2024     1 DOS: the patient was seen and examined on 02/27/2024   Brief hospital course: Carmen [name] is a 33 y.o. female with medical history significant for sickle cell disease, chronic pain syndrome, anemia of chronic disease, depression who is admitted for sickle cell pain crisis.  On presentation vital stable, labs with mild metabolic acidosis.  Patient was started on Dilaudid  PCA pump.  4/27: Vital stable, continue to have significant pain and requiring PCA pump.  4/28: Vital stable, continue to have significant pain and pushing pump frequently.  She was also feeling weak, started her cycle with heavy bleeding, she does not want any medication to decrease the bleeding at this time. Discussed with sickle cell team and they will take over from here.  Assessment and Plan: * Sickle cell pain crisis (HCC) Persistent mid to lower back pain and bilateral hip pain despite initial management in the ED. Hemoglobin stable at 10.4.  Continue to required frequent dosing from PCA pump. - Continue with Dilaudid  PCA pump -Continue with home morphine  60 mg twice daily -Continue with IV Toradol  -Continue with supportive care   Subjective: Patient continued to have significant amount of pain.  She was also complaining of some nausea and feeling weak, stating that she started her cycle today and it is very heavy.  She does not want any hormonal intervention to decrease the amount of bleeding at this time.  Physical Exam: Vitals:   02/27/24 0327 02/27/24 0729 02/27/24 1156 02/27/24 1238  BP: 123/69   122/74  Pulse: 62   63  Resp: 15 16 12 18   Temp: 98.2 F (36.8 C)   98.1 F (36.7 C)  TempSrc: Oral   Oral  SpO2: 97%   95%  Weight:      Height:       General.  Overweight lady, in no acute distress. Pulmonary.  Lungs clear bilaterally, normal respiratory effort. CV.  Regular rate and rhythm, no JVD, rub or  murmur. Abdomen.  Soft, nontender, nondistended, BS positive. CNS.  Alert and oriented .  No focal neurologic deficit. Extremities.  No edema, no cyanosis, pulses intact and symmetrical. Psychiatry.  Judgment and insight appears normal.   Data Reviewed: Prior data reviewed  Family Communication: Discussed with patient  Disposition: Status is: Inpatient The patient will require care spanning > 2 midnights and should be moved to inpatient because: Severity of illness  Planned Discharge Destination: Home  DVT prophylaxis.  Lovenox  Time spent: 45 minutes  This record has been created using Conservation officer, historic buildings. Errors have been sought and corrected,but may not always be located. Such creation errors do not reflect on the standard of care.   Author: Luna Salinas, MD 02/27/2024 1:01 PM  For on call review [email or web address].

## 2024-02-28 LAB — CBC
HCT: 23.5 % — ABNORMAL LOW (ref 36.0–46.0)
Hemoglobin: 8.4 g/dL — ABNORMAL LOW (ref 12.0–15.0)
MCH: 29.4 pg (ref 26.0–34.0)
MCHC: 35.7 g/dL (ref 30.0–36.0)
MCV: 82.2 fL (ref 80.0–100.0)
Platelets: 417 10*3/uL — ABNORMAL HIGH (ref 150–400)
RBC: 2.86 MIL/uL — ABNORMAL LOW (ref 3.87–5.11)
RDW: 15.5 % (ref 11.5–15.5)
WBC: 9 10*3/uL (ref 4.0–10.5)
nRBC: 0.3 % — ABNORMAL HIGH (ref 0.0–0.2)

## 2024-02-28 LAB — BASIC METABOLIC PANEL WITH GFR
Anion gap: 9 (ref 5–15)
BUN: 6 mg/dL (ref 6–20)
CO2: 25 mmol/L (ref 22–32)
Calcium: 9.4 mg/dL (ref 8.9–10.3)
Chloride: 103 mmol/L (ref 98–111)
Creatinine, Ser: 0.51 mg/dL (ref 0.44–1.00)
GFR, Estimated: >60 mL/min (ref >60)
Glucose, Bld: 90 mg/dL (ref 70–99)
Potassium: 3.7 mmol/L (ref 3.5–5.1)
Sodium: 137 mmol/L (ref 135–145)

## 2024-02-28 MED ORDER — ONDANSETRON HCL 4 MG/2ML IJ SOLN: 4.0000 mg | Freq: Four times a day (QID) | INTRAMUSCULAR | Status: DC

## 2024-02-28 NOTE — Progress Notes (Signed)
 Patient ID: [name], female   DOB: 06-30-1991, 33 y.o.   MRN: [id number] Subjective: Carmen [name] is a 33 y.o. female with medical history significant for sickle cell disease, chronic pain syndrome, opiate dependence and tolerance, anemia of chronic disease and major depressive disorder, who presented to the emergency department with worsening mid back and hip pain consistent with sickle cell pain in the past. Patient attributes her menstrual circle as a precipitating factor. Patient is reporting improved pain of 6-5/10. Mild nausea, denies fever, cough and headache     Objective:  Vital signs in last 24 hours:  Vitals:   02/28/24 0410 02/28/24 0710 02/28/24 1022 02/28/24 1152  BP: 136/79   115/73  Pulse: 67   (!) 58  Resp: 17 15 (!) 8 18  Temp: 98.3 F (36.8 C)   98.4 F (36.9 C)  TempSrc: Oral     SpO2: 97%   97%  Weight:      Height:        Intake/Output from previous day:   Intake/Output Summary (Last 24 hours) at 02/28/2024 1401 Last data filed at 02/28/2024 0700 Gross per 24 hour  Intake 300 ml  Output --  Net 300 ml    Physical Exam: General: Alert, awake, oriented x3, in no acute distress.  HEENT: Carmen [name]/AT PEERL, EOMI Neck: Trachea midline,  no masses, no thyromegal,y no JVD, no carotid bruit OROPHARYNX:  Moist, No exudate/ erythema/lesions.  Heart: Regular rate and rhythm, without murmurs, rubs, gallops, PMI non-displaced, no heaves or thrills on palpation.  Lungs: Clear to auscultation, no wheezing or rhonchi noted. No increased vocal fremitus resonant to percussion  Abdomen: Soft, nontender, nondistended, positive bowel sounds, no masses no hepatosplenomegaly noted..  Neuro: No focal neurological deficits noted cranial nerves II through XII grossly intact. DTRs 2+ bilaterally upper and lower extremities. Strength 5 out of 5 in bilateral upper and lower extremities. Musculoskeletal: Back and hip pain Psychiatric: Patient alert and oriented x3, good insight and  cognition, good recent to remote recall. Lymph node survey: No cervical axillary or inguinal lymphadenopathy noted.  Lab Results:  Basic Metabolic Panel:    Component Value Date/Time   NA 137 02/28/2024 0419   NA 140 02/24/2024 0947   K 3.7 02/28/2024 0419   CL 103 02/28/2024 0419   CO2 25 02/28/2024 0419   BUN 6 02/28/2024 0419   BUN 8 02/24/2024 0947   CREATININE 0.51 02/28/2024 0419   GLUCOSE 90 02/28/2024 0419   CALCIUM  9.4 02/28/2024 0419   CBC:    Component Value Date/Time   WBC 9.0 02/28/2024 0419   HGB 8.4 (L) 02/28/2024 0419   HGB 10.4 (L) 02/24/2024 0947   HCT 23.5 (L) 02/28/2024 0419   HCT 32.7 (L) 02/24/2024 0947   PLT 417 (H) 02/28/2024 0419   PLT 583 (H) 02/24/2024 0947   MCV 82.2 02/28/2024 0419   MCV 92 02/24/2024 0947   NEUTROABS 5.9 02/25/2024 1328   NEUTROABS 5.4 02/24/2024 0947   LYMPHSABS 2.0 02/25/2024 1328   LYMPHSABS 2.9 02/24/2024 0947   MONOABS 0.9 02/25/2024 1328   EOSABS 0.0 02/25/2024 1328   EOSABS 0.2 02/24/2024 0947   BASOSABS 0.0 02/25/2024 1328   BASOSABS 0.1 02/24/2024 0947    No results found for this or any previous visit (from the past 240 hours).  Studies/Results: No results found.  Medications: Scheduled Meds:  enoxaparin  (LOVENOX ) injection  40 mg Subcutaneous Q24H   HYDROmorphone    Intravenous Q4H   ketorolac   15 mg Intravenous Q6H   morphine   60 mg Oral Q12H   ondansetron  (ZOFRAN ) IV  4 mg Intravenous Q6H   senna-docusate  1 tablet Oral BID   Continuous Infusions: PRN Meds:.diphenhydrAMINE , naloxone  **AND** sodium chloride  flush, ondansetron  **OR** ondansetron  (ZOFRAN ) IV, polyethylene glycol  Consultants: None  Procedures: None  Antibiotics: None  Assessment/Plan: Principal Problem:   Sickle cell pain crisis (HCC) Active Problems:   Anemia of chronic disease   Chronic pain syndrome   Hb Sickle Cell Disease with Pain crisis: Continue IVF 0.45% Saline @ KVO mls/hour, continue weight based Dilaudid   PCA, IV Toradol  15 mg Q 6 H for a total of 5 days, continue oral home pain medications as ordered. Monitor vitals very closely, Re-evaluate pain scale regularly, 2 L of Oxygen  by [place]. Patient encouraged to ambulate on the hallway today.  Anemia of Chronic Disease:  HGB slightly below baseline, no need for transfusion. Will continue to monitor. Daily Labs in place.  Chronic pain Syndrome: Continue oral home medication.    Code Status: Full Code Family Communication: N/A Disposition Plan: Not yet ready for discharge  Lorel Roes NP  If 7PM-7AM, please contact night-coverage.  02/28/2024, 2:01 PM  LOS: 2 days

## 2024-02-28 NOTE — Plan of Care (Signed)
   Problem: Education: Goal: Knowledge of General Education information will improve Description: Including pain rating scale, medication(s)/side effects and non-pharmacologic comfort measures Outcome: Progressing   Problem: Health Behavior/Discharge Planning: Goal: Ability to manage health-related needs will improve Outcome: Progressing   Problem: Clinical Measurements: Goal: Ability to maintain clinical measurements within normal limits will improve Outcome: Progressing Goal: Will remain free from infection Outcome: Progressing Goal: Diagnostic test results will improve Outcome: Progressing Goal: Respiratory complications will improve Outcome: Progressing Goal: Cardiovascular complication will be avoided Outcome: Progressing   Problem: Nutrition: Goal: Adequate nutrition will be maintained Outcome: Progressing   Problem: Activity: Goal: Risk for activity intolerance will decrease Outcome: Progressing   Problem: Coping: Goal: Level of anxiety will decrease Outcome: Progressing

## 2024-02-29 ENCOUNTER — Other Ambulatory Visit: Payer: Self-pay

## 2024-02-29 ENCOUNTER — Other Ambulatory Visit: Payer: Self-pay | Admitting: Nurse Practitioner

## 2024-02-29 DIAGNOSIS — D571 Sickle-cell disease without crisis: Secondary | ICD-10-CM

## 2024-02-29 DIAGNOSIS — Z79891 Long term (current) use of opiate analgesic: Secondary | ICD-10-CM

## 2024-02-29 LAB — OXYCODONE CLASS, MS, UR RFX
Noroxycodone: >12346 ng/mg{creat}
Noroxymorphone: 1063 ng/mg{creat}
Oxycodone Class Confirmation: POSITIVE
Oxycodone: 5088 ng/mg{creat}
Oxymorphone: 3895 ng/mg{creat}

## 2024-02-29 LAB — TOXASSURE FLEX 15, UR
6-ACETYLMORPHINE IA: NEGATIVE ng/mL
AMPHETAMINES IA: NEGATIVE ng/mL
BARBITURATES IA: NEGATIVE ng/mL
COCAINE METABOLITE IA: NEGATIVE ng/mL
Creatinine: 81 mg/dL
ETHYL ALCOHOL Enzymatic: NEGATIVE g/dL
METHADONE IA: NEGATIVE ng/mL
METHADONE MTB IA: NEGATIVE ng/mL
PHENCYCLIDINE IA: NEGATIVE ng/mL
TAPENTADOL, IA: NEGATIVE ng/mL
TRAMADOL IA: NEGATIVE ng/mL

## 2024-02-29 LAB — OPIATE CLASS, MS, UR RFX
Codeine: NOT DETECTED ng/mg{creat}
Dihydrocodeine: NOT DETECTED ng/mg{creat}
Hydrocodone: NOT DETECTED ng/mg{creat}
Hydromorphone: 181 ng/mg{creat}
Morphine: 3746 ng/mg{creat}
Norcodeine: NOT DETECTED ng/mg{creat}
Norhydrocodone: NOT DETECTED ng/mg{creat}
Normorphine: 317 ng/mg{creat}
Opiate Class Confirmation: POSITIVE

## 2024-02-29 LAB — CANNABINOIDS, MS, UR RFX
Cannabinoids Confirmation: POSITIVE
Carboxy-THC: 674 ng/mg{creat}

## 2024-02-29 MED ORDER — MORPHINE SULFATE ER 60 MG PO TBCR: 60.0000 mg | EXTENDED_RELEASE_TABLET | Freq: Two times a day (BID) | ORAL | 0 refills | Status: DC

## 2024-02-29 MED ORDER — OXYCODONE HCL 10 MG PO TABS: 10.0000 mg | ORAL_TABLET | Freq: Four times a day (QID) | ORAL | 0 refills | Status: DC | PRN

## 2024-02-29 NOTE — Telephone Encounter (Signed)
 Please advise Kh

## 2024-02-29 NOTE — Discharge Summary (Signed)
 Physician Discharge Summary  Carmen [name]:[id number] DOB: 14-Jul-1991 DOA: 02/25/2024  PCP: Paseda, Folashade R, FNP  Admit date: 02/25/2024  Discharge date: 02/29/2024  Discharge Diagnoses:  Principal Problem:   Sickle cell pain crisis (HCC) Active Problems:   Anemia of chronic disease   Chronic pain syndrome   Discharge Condition: Stable  Disposition:  Pt is discharged home in good condition and is to follow up with Paseda, Folashade R, FNP this week to have labs evaluated. CRUZ MELLIS is instructed to increase activity slowly and balance with rest for the next few days, and use prescribed medication to complete treatment of pain  Diet: Regular Wt Readings from Last 3 Encounters:  02/25/24 83 kg  02/24/24 83 kg  01/29/24 85 kg    History of present illness:  Carmen [name] is a 33 y.o. female with medical history significant for sickle cell disease, chronic pain syndrome, opiate dependence and tolerance, anemia of chronic disease and major depressive disorder, who presented to the emergency department with worsening mid back and hip pain consistent with sickle cell pain in the past. Patient attributes her menstrual circle as a precipitating factor. Denies fever, cough and headache. No sick contacts or recent travels.  ED Course: BP 133/84, pulse 104, RR 20, temp 99.0 F, SpO2 98% on room air, sodium 138, potassium 3.6, bicarb 20, BUN 8, creatinine 0.53, serum glucose 87, WBC 8.9, hemoglobin 10.4, platelets 596.  Patient was given IV Dilaudid  2 mg x 3, IV Toradol  15 mg with persistent pain.  Patients symptoms /pain did not resolve she is admitted for ongoing sickle cell pain management.    Hospital Course:  Patient was admitted for sickle cell pain crisis and managed appropriately with IVF, IV Dilaudid  via PCA and IV Toradol , as well as other adjunct therapies per sickle cell pain management protocols. Patient is reporting significant improvement to pain at 3/10. She is able to  tolerate P.O without N/V. Ambulating without assistance.  Patient was therefore discharged home today in a hemodynamically stable condition.   Brielynn will follow-up with PCP within 1 week of this discharge. Tamela was counseled extensively about nonpharmacologic means of pain management, patient verbalized understanding and was appreciative of  the care received during this admission.   We discussed the need for good hydration, monitoring of hydration status, avoidance of heat, cold, stress, and infection triggers. We discussed the need to be adherent with taking Hydrea  and other home medications. Patient was reminded of the need to seek medical attention immediately if any symptom of bleeding, anemia, or infection occurs.  Discharge Exam: Vitals:   02/29/24 0723 02/29/24 1141  BP:  135/77  Pulse:  67  Resp: 17 16  Temp:  99 F (37.2 C)  SpO2: 96% 100%   Vitals:   02/29/24 0230 02/29/24 0503 02/29/24 0723 02/29/24 1141  BP:  125/61  135/77  Pulse:  78  67  Resp: 17 13 17 16   Temp:  98.3 F (36.8 C)  99 F (37.2 C)  TempSrc:    Oral  SpO2: 96% 95% 96% 100%  Weight:      Height:        General appearance : Awake, alert, not in any distress. Speech Clear. Not toxic looking HEENT: Atraumatic and Normocephalic, pupils equally reactive to light and accomodation Neck: Supple, no JVD. No cervical lymphadenopathy.  Chest: Good air entry bilaterally, no added sounds  CVS: S1 S2 regular, no murmurs.  Abdomen: Bowel sounds present, Non tender  and not distended with no gaurding, rigidity or rebound. Extremities: B/L Lower Ext shows no edema, both legs are warm to touch Neurology: Awake alert, and oriented X 3, CN II-XII intact, Non focal Skin: No Rash  Discharge Instructions  Discharge Instructions     Call MD for:  temperature >100.4   Complete by: As directed    Diet - low sodium heart healthy   Complete by: As directed    Increase activity slowly   Complete by: As directed        Allergies as of 02/29/2024       Reactions   Ketamine  Hives, Nausea And Vomiting, Other (See Comments)   Makes the patient "feel funny" also        Medication List     STOP taking these medications    morphine  60 MG 12 hr tablet Commonly known as: MS CONTIN        TAKE these medications    Benadryl  Allergy 25 MG capsule Generic drug: diphenhydrAMINE  Take 25 mg by mouth every 6 (six) hours as needed for allergies.   cyclobenzaprine  10 MG tablet Commonly known as: FLEXERIL  Take 1 tablet (10 mg total) by mouth at bedtime. TAKE 1 TABLET BY MOUTH DAILY AS NEEDED FOR MUSCLE SPASMS.   folic acid  1 MG tablet Commonly known as: FOLVITE  Take 1 tablet (1 mg total) by mouth daily.   Oxycodone  HCl 10 MG Tabs Take 1 tablet (10 mg total) by mouth every 6 (six) hours as needed (pain).   Vitamin D  (Ergocalciferol ) 1.25 MG (50000 UNIT) Caps capsule Commonly known as: DRISDOL  TAKE 1 CAPSULE BY MOUTH ONE TIME PER WEEK What changed:  how much to take how to take this when to take this additional instructions        The results of significant diagnostics from this hospitalization (including imaging, microbiology, ancillary and laboratory) are listed below for reference.    Significant Diagnostic Studies: No results found.  Microbiology: No results found for this or any previous visit (from the past 240 hours).   Labs: Basic Metabolic Panel: Recent Labs  Lab 02/24/24 0947 02/25/24 1328 02/26/24 1035 02/28/24 0419  NA 140 138 136 137  K 4.4 3.6 3.6 3.7  CL 105 107 104 103  CO2 20 20* 23 25  GLUCOSE 88 87 98 90  BUN 8 8 10 6   CREATININE 0.60 0.53 0.36* 0.51  CALCIUM  9.6 9.5 9.0 9.4   Liver Function Tests: Recent Labs  Lab 02/24/24 0947 02/25/24 1328  AST 20 28  ALT 14 21  ALKPHOS 74 54  BILITOT 1.1 1.5*  PROT 7.6 8.2*  ALBUMIN 4.4 4.4   No results for input(s): "LIPASE", "AMYLASE" in the last 168 hours. No results for input(s): "AMMONIA" in the last  168 hours. CBC: Recent Labs  Lab 02/24/24 0947 02/25/24 1328 02/26/24 1132 02/28/24 0419  WBC 9.5 8.9 9.2 9.0  NEUTROABS 5.4 5.9  --   --   HGB 10.4* 10.4* 9.7* 8.4*  HCT 32.7* 29.1* 27.3* 23.5*  MCV 92 83.9 84.3 82.2  PLT 583* 596* 485* 417*   Cardiac Enzymes: No results for input(s): "CKTOTAL", "CKMB", "CKMBINDEX", "TROPONINI" in the last 168 hours. BNP: Invalid input(s): "POCBNP" CBG: No results for input(s): "GLUCAP" in the last 168 hours.  Time coordinating discharge: 50 minutes  Signed:  Lorel Roes NP  [facility] 02/29/2024, 11:51 AM

## 2024-02-29 NOTE — Progress Notes (Signed)
 Reviewed PDMP substance reporting system prior to prescribing opiate medications. No inconsistencies noted.     1. 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  Dispense: 60 tablet; Refill: 0 - Oxycodone  HCl 10 MG TABS; Take 1 tablet (10 mg total) by mouth every 6 (six) hours as needed (pain).  Dispense: 60 tablet; Refill: 0  2. Chronic prescription opiate use  - morphine  (MS CONTIN ) 60 MG 12 hr tablet; Take 1 tablet (60 mg total) by mouth every 12 (twelve) hours.  Dispense: 60 tablet; Refill: 0 - Oxycodone  HCl 10 MG TABS; Take 1 tablet (10 mg total) by mouth every 6 (six) hours as needed (pain).  Dispense: 60 tablet; Refill: 0

## 2024-02-29 NOTE — Plan of Care (Signed)

## 2024-03-01 ENCOUNTER — Telehealth: Payer: Self-pay | Admitting: *Deleted

## 2024-03-01 NOTE — Transitions of Care (Post Inpatient/ED Visit) (Signed)
   03/01/2024  Name: Carmen [name] MRN: [id number] DOB: 09/08/1991  Today's TOC FU Call Status: Today's TOC FU Call Status:: Unsuccessful Call (1st Attempt) Unsuccessful Call (1st Attempt) Date: 03/01/24  Attempted to reach the patient regarding the most recent Inpatient/ED visit.  Follow Up Plan: Additional outreach attempts will be made to reach the patient to complete the Transitions of Care (Post Inpatient/ED visit) call.   Cecilie Coffee [facility], BSN RN Care Manager/ Transition of Care [place]/ [facility] [phone]

## 2024-03-02 ENCOUNTER — Telehealth: Payer: Self-pay | Admitting: *Deleted

## 2024-03-02 NOTE — Transitions of Care (Post Inpatient/ED Visit) (Signed)
   03/02/2024  Name: Carmen [name] MRN: [id number] DOB: May 03, 1991  Today's TOC FU Call Status: Today's TOC FU Call Status:: Unsuccessful Call (2nd Attempt) Unsuccessful Call (2nd Attempt) Date: 03/02/24  Attempted to reach the patient regarding the most recent Inpatient/ED visit.  Follow Up Plan: Additional outreach attempts will be made to reach the patient to complete the Transitions of Care (Post Inpatient/ED visit) call.   Cecilie Coffee [facility], BSN RN Care Manager/ Transition of Care [place]/ [facility] [phone]

## 2024-03-13 ENCOUNTER — Other Ambulatory Visit: Payer: Self-pay | Admitting: Nurse Practitioner

## 2024-03-13 DIAGNOSIS — D571 Sickle-cell disease without crisis: Secondary | ICD-10-CM

## 2024-03-13 DIAGNOSIS — Z79891 Long term (current) use of opiate analgesic: Secondary | ICD-10-CM

## 2024-03-13 MED ORDER — OXYCODONE HCL 10 MG PO TABS: 10.0000 mg | ORAL_TABLET | Freq: Four times a day (QID) | ORAL | 0 refills | Status: DC | PRN

## 2024-03-13 NOTE — Progress Notes (Unsigned)
 Reviewed PDMP substance reporting system prior to prescribing opiate medications. No inconsistencies noted.     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  Dispense: 60 tablet; Refill: 0  2. Chronic prescription opiate use  - Oxycodone HCl 10 MG TABS; Take 1 tablet (10 mg total) by mouth every 6 (six) hours as needed (pain).  Dispense: 60 tablet; Refill: 0

## 2024-03-29 ENCOUNTER — Other Ambulatory Visit: Payer: Self-pay | Admitting: Nurse Practitioner

## 2024-03-29 DIAGNOSIS — D571 Sickle-cell disease without crisis: Secondary | ICD-10-CM

## 2024-03-29 DIAGNOSIS — Z79891 Long term (current) use of opiate analgesic: Secondary | ICD-10-CM

## 2024-03-29 NOTE — Telephone Encounter (Unsigned)
 Copied from CRM [id number]. Topic: Clinical - Medication Refill >> Mar 29, 2024 10:17 AM Phil Braun wrote: Medication: morphine  (MS CONTIN ) 60 MG 12 hr tablet  Has the patient contacted their pharmacy? No   This is the patient's preferred pharmacy:  [id number] - [place], [place] - [address] AT [address] [place] [address] Phone: [phone] Fax: [phone]  Is this the correct pharmacy for this prescription? Yes If no, delete pharmacy and type the correct one.   Has the prescription been filled recently? Yes  Is the patient out of the medication? Yes  Has the patient been seen for an appointment in the last year OR does the patient have an upcoming appointment? Yes  Can we respond through MyChart? Yes  Agent: Please be advised that Rx refills may take up to 3 business days. We ask that you follow-up with your pharmacy.

## 2024-03-30 ENCOUNTER — Telehealth: Payer: Self-pay | Admitting: Nurse Practitioner

## 2024-03-30 ENCOUNTER — Other Ambulatory Visit: Payer: Self-pay | Admitting: Nurse Practitioner

## 2024-03-30 DIAGNOSIS — D571 Sickle-cell disease without crisis: Secondary | ICD-10-CM

## 2024-03-30 DIAGNOSIS — Z79891 Long term (current) use of opiate analgesic: Secondary | ICD-10-CM

## 2024-03-30 MED ORDER — MORPHINE SULFATE ER 60 MG PO TBCR: 60.0000 mg | EXTENDED_RELEASE_TABLET | Freq: Two times a day (BID) | ORAL | 0 refills | Status: DC

## 2024-03-30 NOTE — Progress Notes (Signed)
 Reviewed PDMP substance reporting system prior to prescribing opiate medications. No inconsistencies noted.   1. 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  Dispense: 60 tablet; Refill: 0  2. Chronic prescription opiate use  - morphine (MS CONTIN) 60 MG 12 hr tablet; Take 1 tablet (60 mg total) by mouth every 12 (twelve) hours.  Dispense: 60 tablet; Refill: 0

## 2024-03-30 NOTE — Telephone Encounter (Signed)
 Caller & Relationship to patient:  MRN #  [id number]   Call Back Number:   Date of Last Office Visit: 03/29/2024     Date of Next Office Visit: 05/25/2024    Medication(s) to be Refilled: Morphine  60mg  due 03/31/24  Preferred Pharmacy:   ** Please notify patient to allow 48-72 hours to process** **Let patient know to contact pharmacy at the end of the day to make sure medication is ready. ** **If patient has not been seen in a year or longer, book an appointment **Advise to use MyChart for refill requests OR to contact their pharmacy

## 2024-03-31 ENCOUNTER — Other Ambulatory Visit: Payer: Self-pay

## 2024-03-31 ENCOUNTER — Emergency Department (HOSPITAL_COMMUNITY)
Admission: EM | Admit: 2024-03-31 | Discharge: 2024-03-31 | Disposition: A | Discharge status: Home / Self Care | Payer: MEDICAID | Attending: Emergency Medicine | Admitting: Emergency Medicine

## 2024-03-31 DIAGNOSIS — D571 Sickle-cell disease without crisis: Secondary | ICD-10-CM

## 2024-03-31 DIAGNOSIS — N939 Abnormal uterine and vaginal bleeding, unspecified: Secondary | ICD-10-CM | POA: Diagnosis not present

## 2024-03-31 DIAGNOSIS — D5700 Hb-SS disease with crisis, unspecified: Secondary | ICD-10-CM | POA: Insufficient documentation

## 2024-03-31 DIAGNOSIS — Z79891 Long term (current) use of opiate analgesic: Secondary | ICD-10-CM

## 2024-03-31 LAB — COMPREHENSIVE METABOLIC PANEL WITH GFR
ALT: 27 U/L (ref 0–44)
AST: 30 U/L (ref 15–41)
Albumin: 4.3 g/dL (ref 3.5–5.0)
Alkaline Phosphatase: 72 U/L (ref 38–126)
Anion gap: 8 (ref 5–15)
BUN: 12 mg/dL (ref 6–20)
CO2: 24 mmol/L (ref 22–32)
Calcium: 9.7 mg/dL (ref 8.9–10.3)
Chloride: 106 mmol/L (ref 98–111)
Creatinine, Ser: 0.63 mg/dL (ref 0.44–1.00)
GFR, Estimated: >60 mL/min (ref >60)
Glucose, Bld: 105 mg/dL — ABNORMAL HIGH (ref 70–99)
Potassium: 3.6 mmol/L (ref 3.5–5.1)
Sodium: 138 mmol/L (ref 135–145)
Total Bilirubin: 1.4 mg/dL — ABNORMAL HIGH (ref 0.0–1.2)
Total Protein: 8.8 g/dL — ABNORMAL HIGH (ref 6.5–8.1)

## 2024-03-31 LAB — URINALYSIS, ROUTINE W REFLEX MICROSCOPIC
Bilirubin Urine: NEGATIVE
Glucose, UA: NEGATIVE mg/dL
Ketones, ur: NEGATIVE mg/dL
Leukocytes,Ua: NEGATIVE
Nitrite: NEGATIVE
Protein, ur: NEGATIVE mg/dL
Specific Gravity, Urine: 1.011 (ref 1.005–1.030)
pH: 6 (ref 5.0–8.0)

## 2024-03-31 LAB — RETICULOCYTES
Immature Retic Fract: 37.9 % — ABNORMAL HIGH (ref 2.3–15.9)
RBC.: 3.49 MIL/uL — ABNORMAL LOW (ref 3.87–5.11)
Retic Count, Absolute: 146.2 10*3/uL (ref 19.0–186.0)
Retic Ct Pct: 4.2 % — ABNORMAL HIGH (ref 0.4–3.1)

## 2024-03-31 LAB — CBC
HCT: 30.5 % — ABNORMAL LOW (ref 36.0–46.0)
Hemoglobin: 10.5 g/dL — ABNORMAL LOW (ref 12.0–15.0)
MCH: 29.7 pg (ref 26.0–34.0)
MCHC: 34.4 g/dL (ref 30.0–36.0)
MCV: 86.4 fL (ref 80.0–100.0)
Platelets: 503 10*3/uL — ABNORMAL HIGH (ref 150–400)
RBC: 3.53 MIL/uL — ABNORMAL LOW (ref 3.87–5.11)
RDW: 13.8 % (ref 11.5–15.5)
WBC: 7.2 10*3/uL (ref 4.0–10.5)
nRBC: 0.3 % — ABNORMAL HIGH (ref 0.0–0.2)

## 2024-03-31 LAB — HCG, SERUM, QUALITATIVE: Preg, Serum: NEGATIVE

## 2024-03-31 MED ORDER — HYDROMORPHONE HCL 1 MG/ML IJ SOLN
2.0000 mg | INTRAMUSCULAR | Status: AC
  Administered 2024-03-31: 2 mg via INTRAVENOUS
  Filled 2024-03-31: qty 2

## 2024-03-31 MED ORDER — DIPHENHYDRAMINE HCL 25 MG PO CAPS
25.0000 mg | ORAL_CAPSULE | Freq: Once | ORAL | Status: AC
  Administered 2024-03-31: 25 mg via ORAL
  Filled 2024-03-31: qty 1

## 2024-03-31 MED ORDER — HYDROMORPHONE HCL 1 MG/ML IJ SOLN
2.0000 mg | Freq: Once | INTRAMUSCULAR | Status: AC
  Administered 2024-03-31: 2 mg via INTRAVENOUS
  Filled 2024-03-31 (×2): qty 2

## 2024-03-31 MED ORDER — OXYCODONE HCL 10 MG PO TABS: 10.0000 mg | ORAL_TABLET | Freq: Four times a day (QID) | ORAL | 0 refills | Status: DC | PRN

## 2024-03-31 MED ORDER — ACETAMINOPHEN 500 MG PO TABS
1000.0000 mg | ORAL_TABLET | Freq: Once | ORAL | Status: AC
  Administered 2024-03-31: 1000 mg via ORAL
  Filled 2024-03-31: qty 2

## 2024-03-31 MED ORDER — KETOROLAC TROMETHAMINE 15 MG/ML IJ SOLN
15.0000 mg | Freq: Once | INTRAMUSCULAR | Status: AC
  Administered 2024-03-31: 15 mg via INTRAVENOUS
  Filled 2024-03-31 (×2): qty 1

## 2024-03-31 MED ORDER — MORPHINE SULFATE ER 60 MG PO TBCR: 60.0000 mg | EXTENDED_RELEASE_TABLET | Freq: Two times a day (BID) | ORAL | 0 refills | Status: DC

## 2024-03-31 NOTE — Discharge Instructions (Addendum)
 Follow closely with your gyn about your bleeding. If you don't have a gyn you may f/u with center for women.  Get help right away if: You faint. You have bleeding that soaks through a sanitary pad every hour. You have pain in the abdomen. You have a fever or chills. You become sweaty or weak. You pass large blood clots from your vagina. These symptoms may represent a serious problem that is an emergency. Do not wait to see if the symptoms will go away. Get medical help right away. Call your local emergency services (911 in the [place] Do not drive yourself to the hospital.

## 2024-03-31 NOTE — ED Provider Notes (Signed)
 f Physical Exam  BP 109/73 (BP Location: Left Arm)   Pulse 90   Temp 98.5 F (36.9 C) (Oral)   Resp 18   LMP 02/26/2024   SpO2 96%   Physical Exam  Procedures  Procedures  ED Course / MDM   Clinical Course as of 03/31/24 0839  Sat Mar 31, 2024  0634 SS pain crisis. AUB after stopping her depo, 2 weeks of bleeding, thinks that her ss pain crisis is being exacerbated by her bleeding.  [AH]  0752 Hemoglobin(!): 10.5 HGB 2 g higher than last month [AH]  1610 Patient evaluated. She currently is at a 7/10. More pain control ordered  [AH]    Clinical Course User Index [AH] Inita Uram, PA-C   Medical Decision Making Amount and/or Complexity of Data Reviewed Labs: ordered.  Risk OTC drugs. Prescription drug management.   ***

## 2024-03-31 NOTE — ED Triage Notes (Signed)
 Pt reports that she is been off depo shot and been bleeding 2 weeks. Has OBGYN appt in July. Is experiencing pain in arms/shoulders, sides/ flank, lower back. Took home meds without relief.

## 2024-03-31 NOTE — ED Provider Notes (Signed)
 [place] EMERGENCY DEPARTMENT AT [facility] Provider Note   CSN: [id number] Arrival date & time: 03/31/24  6962     History  Chief Complaint  Patient presents with   Sickle Cell Pain Crisis    Carmen [name] is a 33 y.o. female with medical history significant for sickle cell crisis, sickle cell disease, chronic prescription opiate use, chronic musculoskeletal pain.  Patient presents to ED for evaluation of vaginal bleeding as well as sickle cell crisis.  States that the last 2 weeks she has had persistent vaginal bleeding.  Reports that she was on Depo-Provera  shot but has not had Depo-Provera  shot since October or December.  She states that 2 weeks ago her menstrual cycle began and has not stopped.  She states she is going through about 1 pad every 3 hours.  Reports that she has gone through 2 boxes of tampons since onset of bleeding.  Denies any abdominal pain, dysuria, nausea or vomiting.  Denies fevers at home.  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  Reports he has been taking her prescribed oxycodone  20 mg as well as her morphine  60 mg at home without relief.  Reports she does not typically take scheduled pain medication as she does not want to be "messed up" around her daughter.  She denies nausea or vomiting.  She reports that her typical sickle cell crisis is in her bilateral flanks, upper extremities.  She reports today that her pain is in her right arm as well as her bilateral flanks.  Denies lightheadedness, dizziness or weakness.   Sickle Cell Pain Crisis      Home Medications Prior to Admission medications   Medication Sig Start Date End Date Taking? Authorizing Provider  BENADRYL  ALLERGY 25 MG capsule Take 25 mg by mouth every 6 (six) hours as needed for allergies.    [provider]  cyclobenzaprine  (FLEXERIL ) 10 MG tablet Take 1 tablet (10 mg total) by mouth at bedtime. TAKE 1 TABLET BY MOUTH DAILY AS NEEDED FOR MUSCLE SPASMS. 01/16/24   Paseda, Folashade  R, FNP  folic acid  (FOLVITE ) 1 MG tablet Take 1 tablet (1 mg total) by mouth daily. 12/27/23   Paseda, Folashade R, FNP  morphine  (MS CONTIN ) 60 MG 12 hr tablet Take 1 tablet (60 mg total) by mouth every 12 (twelve) hours. 03/31/24   Paseda, Folashade R, FNP  Oxycodone  HCl 10 MG TABS Take 1 tablet (10 mg total) by mouth every 6 (six) hours as needed (pain). 03/16/24   Paseda, Folashade R, FNP  Vitamin D , Ergocalciferol , (DRISDOL ) 1.25 MG (50000 UNIT) CAPS capsule TAKE 1 CAPSULE BY MOUTH ONE TIME PER WEEK Patient taking differently: Take 50,000 Units by mouth every 7 (seven) days. On Wednesday 12/27/23   Paseda, Folashade R, FNP      Allergies    Ketamine     Review of Systems   Review of Systems  Genitourinary:  Positive for flank pain and vaginal bleeding.  Musculoskeletal:  Positive for arthralgias.  All other systems reviewed and are negative.   Physical Exam Updated Vital Signs BP 109/73 (BP Location: Left Arm)   Pulse 90   Temp 98.5 F (36.9 C) (Oral)   Resp 18   LMP 02/26/2024   SpO2 96%  Physical Exam Vitals and nursing note reviewed.  Constitutional:      General: She is not in acute distress.    Appearance: She is well-developed.  HENT:     Head: Normocephalic and atraumatic.  Eyes:  Conjunctiva/sclera: Conjunctivae normal.  Cardiovascular:     Rate and Rhythm: Normal rate and regular rhythm.     Heart sounds: No murmur heard. Pulmonary:     Effort: Pulmonary effort is normal. No respiratory distress.     Breath sounds: Normal breath sounds.  Abdominal:     Palpations: Abdomen is soft.     Tenderness: There is no abdominal tenderness.  Musculoskeletal:        General: No swelling.     Cervical back: Neck supple.  Skin:    General: Skin is warm and dry.     Capillary Refill: Capillary refill takes less than 2 seconds.  Neurological:     Mental Status: She is alert and oriented to person, place, and time. Mental status is at baseline.  Psychiatric:         Mood and Affect: Mood normal.     ED Results / Procedures / Treatments   Labs (all labs ordered are listed, but only abnormal results are displayed) Labs Reviewed  CBC  COMPREHENSIVE METABOLIC PANEL WITH GFR  URINALYSIS, ROUTINE W REFLEX MICROSCOPIC  RETICULOCYTES  HCG, SERUM, QUALITATIVE    EKG None  Radiology No results found.  Procedures Procedures    Medications Ordered in ED Medications  HYDROmorphone  (DILAUDID ) injection 2 mg (has no administration in time range)  ketorolac  (TORADOL ) 15 MG/ML injection 15 mg (has no administration in time range)  acetaminophen  (TYLENOL ) tablet 1,000 mg (has no administration in time range)  diphenhydrAMINE  (BENADRYL ) capsule 25 mg (has no administration in time range)    ED Course/ Medical Decision Making/ A&P Clinical Course as of 03/31/24 0640  Sat Mar 31, 2024  0634 SS pain crisis. AUB after stopping her depo, 2 weeks of bleeding, thinks that her ss pain crisis is being exacerbated by her bleeding.  [AH]    Clinical Course User Index [AH] Tama Fails, PA-C   Medical Decision Making Amount and/or Complexity of Data Reviewed Labs: ordered.  Risk OTC drugs. Prescription drug management.   33 year old female presents for evaluation.  Please see HPI for further details.  On exam patient is afebrile and nontachycardic.  Lung sounds clear bilaterally, nonhypoxic.  Abdomen soft and compressible.  Neurological examination at baseline.  Will collect CBC, CMP, reticulocytes, urinalysis and pregnancy test.  Patient pain treated with Toradol , Dilaudid , Benadryl  and Tylenol .  At end of shift, patient workup not complete.  Signed out to oncoming provider.  Plan of management discussed.   Final Clinical Impression(s) / ED Diagnoses Final diagnoses:  Sickle cell pain crisis [facility])  Vaginal bleeding    Rx / DC Orders ED Discharge Orders     None         Adel Aden, PA-C 03/31/24 4270    Palumbo,  April, MD 03/31/24 [phone]

## 2024-03-31 NOTE — ED Notes (Signed)
 Pt ambulated independently with steady gait to restroom to obtain urine specimen.

## 2024-03-31 NOTE — ED Notes (Signed)
2 unsuccessful IV start attempts

## 2024-04-02 ENCOUNTER — Telehealth: Payer: Self-pay

## 2024-04-02 NOTE — Transitions of Care (Post Inpatient/ED Visit) (Signed)
   04/02/2024  Name: Carmen [name] MRN: [id number] DOB: [birth year]  Today's TOC FU Call Status: Today's TOC FU Call Status:: Unsuccessful Call (1st Attempt) Unsuccessful Call (1st Attempt) Date: 04/02/24  Attempted to reach the patient regarding the most recent Inpatient/ED visit.  Follow Up Plan: Additional outreach attempts will be made to reach the patient to complete the Transitions of Care (Post Inpatient/ED visit) call.   Signature  [employer], [place]

## 2024-04-04 ENCOUNTER — Encounter: Payer: MEDICAID | Admitting: Radiology

## 2024-04-17 ENCOUNTER — Other Ambulatory Visit: Payer: Self-pay

## 2024-04-27 ENCOUNTER — Other Ambulatory Visit: Payer: Self-pay | Admitting: Nurse Practitioner

## 2024-04-27 DIAGNOSIS — G8929 Other chronic pain: Secondary | ICD-10-CM

## 2024-04-30 ENCOUNTER — Other Ambulatory Visit: Payer: Self-pay | Admitting: Nurse Practitioner

## 2024-04-30 ENCOUNTER — Telehealth: Payer: Self-pay | Admitting: Nurse Practitioner

## 2024-04-30 DIAGNOSIS — Z79891 Long term (current) use of opiate analgesic: Secondary | ICD-10-CM

## 2024-04-30 DIAGNOSIS — D571 Sickle-cell disease without crisis: Secondary | ICD-10-CM

## 2024-04-30 MED ORDER — OXYCODONE HCL 10 MG PO TABS: 10.0000 mg | ORAL_TABLET | Freq: Four times a day (QID) | ORAL | 0 refills | Status: DC | PRN

## 2024-04-30 NOTE — Telephone Encounter (Signed)
 Patient is calling due to her meds, she stated that [facility] [address] don't have her Morphine  60 mg, she also need a refill on Oxycodone  10 mg and she is out of both meds She is waiting for the pharmacy on [address] to call her back with an answer if they have morphine  60 mg

## 2024-04-30 NOTE — Progress Notes (Signed)
 Reviewed PDMP substance reporting system prior to prescribing opiate medications. No inconsistencies noted.     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  Dispense: 60 tablet; Refill: 0  2. Chronic prescription opiate use  - Oxycodone HCl 10 MG TABS; Take 1 tablet (10 mg total) by mouth every 6 (six) hours as needed (pain).  Dispense: 60 tablet; Refill: 0

## 2024-05-03 ENCOUNTER — Other Ambulatory Visit: Payer: Self-pay | Admitting: Nurse Practitioner

## 2024-05-03 DIAGNOSIS — Z79891 Long term (current) use of opiate analgesic: Secondary | ICD-10-CM

## 2024-05-03 DIAGNOSIS — D571 Sickle-cell disease without crisis: Secondary | ICD-10-CM

## 2024-05-03 MED ORDER — MORPHINE SULFATE ER 60 MG PO TBCR
60.0000 mg | EXTENDED_RELEASE_TABLET | Freq: Two times a day (BID) | ORAL | 0 refills | Status: DC
Start: 2024-05-03 — End: 2024-06-05

## 2024-05-03 NOTE — Telephone Encounter (Signed)
 Please advise Carmen [name] Than you so much for all your help today.

## 2024-05-03 NOTE — Telephone Encounter (Signed)
 Copied from CRM [id number]. Topic: Clinical - Medication Refill >> May 03, 2024 12:33 PM Emylou G wrote: Medication: morphine  (MS CONTIN ) 60 MG 12 hr tablet  She only needs remainder 38 back in stock  Has the patient contacted their pharmacy? Yes (Agent: If no, request that the patient contact the pharmacy for the refill. If patient does not wish to contact the pharmacy document the reason why and proceed with request.) (Agent: If yes, when and what did the pharmacy advise?) said ok to call us   This is the patient's preferred pharmacy:  [id number] [place] [place] - [address] AT [facility] [address] [place] [address] Phone: [phone] Fax: [phone]   Is this the correct pharmacy for this prescription? Yes If no, delete pharmacy and type the correct one.   Has the prescription been filled recently? Yes  Is the patient out of the medication? Yes  Has the patient been seen for an appointment in the last year OR does the patient have an upcoming appointment? Yes  Can we respond through MyChart? Yes  Agent: Please be advised that Rx refills may take up to 3 business days. We ask that you follow-up with your pharmacy.

## 2024-05-08 ENCOUNTER — Encounter: Payer: MEDICAID | Admitting: Radiology

## 2024-05-14 ENCOUNTER — Other Ambulatory Visit: Payer: Self-pay

## 2024-05-15 ENCOUNTER — Other Ambulatory Visit: Payer: Self-pay | Admitting: Nurse Practitioner

## 2024-05-15 DIAGNOSIS — Z79891 Long term (current) use of opiate analgesic: Secondary | ICD-10-CM

## 2024-05-15 DIAGNOSIS — D571 Sickle-cell disease without crisis: Secondary | ICD-10-CM

## 2024-05-15 MED ORDER — OXYCODONE HCL 10 MG PO TABS: 10.0000 mg | ORAL_TABLET | Freq: Four times a day (QID) | ORAL | 0 refills | Status: DC | PRN

## 2024-05-15 NOTE — Progress Notes (Signed)
 Reviewed PDMP substance reporting system prior to prescribing opiate medications. No inconsistencies noted.     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  Dispense: 60 tablet; Refill: 0  2. Chronic prescription opiate use  - Oxycodone HCl 10 MG TABS; Take 1 tablet (10 mg total) by mouth every 6 (six) hours as needed (pain).  Dispense: 60 tablet; Refill: 0

## 2024-05-16 ENCOUNTER — Telehealth: Payer: Self-pay

## 2024-05-16 ENCOUNTER — Other Ambulatory Visit: Payer: Self-pay | Admitting: Nurse Practitioner

## 2024-05-16 ENCOUNTER — Other Ambulatory Visit: Payer: Self-pay

## 2024-05-16 DIAGNOSIS — Z79891 Long term (current) use of opiate analgesic: Secondary | ICD-10-CM

## 2024-05-16 DIAGNOSIS — D571 Sickle-cell disease without crisis: Secondary | ICD-10-CM

## 2024-05-16 NOTE — Telephone Encounter (Unsigned)
 Copied from CRM [id number]. Topic: Clinical - Medication Prior Auth >> May 16, 2024  9:42 AM Wess RAMAN wrote: Reason for CRM: Patient needs prior authorization for Oxycodone  HCl 10 MG TABS

## 2024-05-16 NOTE — Telephone Encounter (Signed)
 Error

## 2024-05-18 ENCOUNTER — Emergency Department (HOSPITAL_COMMUNITY)
Admission: EM | Admit: 2024-05-18 | Discharge: 2024-05-18 | Disposition: A | Discharge status: Home / Self Care | Payer: MEDICAID | Attending: Emergency Medicine | Admitting: Emergency Medicine

## 2024-05-18 ENCOUNTER — Other Ambulatory Visit: Payer: Self-pay

## 2024-05-18 ENCOUNTER — Emergency Department (HOSPITAL_COMMUNITY): Payer: MEDICAID

## 2024-05-18 DIAGNOSIS — J069 Acute upper respiratory infection, unspecified: Secondary | ICD-10-CM | POA: Diagnosis not present

## 2024-05-18 DIAGNOSIS — J029 Acute pharyngitis, unspecified: Secondary | ICD-10-CM | POA: Diagnosis present

## 2024-05-18 DIAGNOSIS — D5700 Hb-SS disease with crisis, unspecified: Secondary | ICD-10-CM | POA: Insufficient documentation

## 2024-05-18 LAB — COMPREHENSIVE METABOLIC PANEL WITH GFR
ALT: 25 U/L (ref 0–44)
AST: 21 U/L (ref 15–41)
Albumin: 3.8 g/dL (ref 3.5–5.0)
Alkaline Phosphatase: 69 U/L (ref 38–126)
Anion gap: 7 (ref 5–15)
BUN: 6 mg/dL (ref 6–20)
CO2: 27 mmol/L (ref 22–32)
Calcium: 9 mg/dL (ref 8.9–10.3)
Chloride: 105 mmol/L (ref 98–111)
Creatinine, Ser: 0.49 mg/dL (ref 0.44–1.00)
GFR, Estimated: >60 mL/min (ref >60)
Glucose, Bld: 94 mg/dL (ref 70–99)
Potassium: 3.4 mmol/L — ABNORMAL LOW (ref 3.5–5.1)
Sodium: 139 mmol/L (ref 135–145)
Total Bilirubin: 1.2 mg/dL (ref 0.0–1.2)
Total Protein: 7.9 g/dL (ref 6.5–8.1)

## 2024-05-18 LAB — CBC WITH DIFFERENTIAL/PLATELET
Abs Immature Granulocytes: 0.09 K/uL — ABNORMAL HIGH (ref 0.00–0.07)
Basophils Absolute: 0.1 K/uL (ref 0.0–0.1)
Basophils Relative: 0 %
Eosinophils Absolute: 0.1 K/uL (ref 0.0–0.5)
Eosinophils Relative: 1 %
HCT: 26.3 % — ABNORMAL LOW (ref 36.0–46.0)
Hemoglobin: 9.3 g/dL — ABNORMAL LOW (ref 12.0–15.0)
Immature Granulocytes: 1 %
Lymphocytes Relative: 35 %
Lymphs Abs: 5 K/uL — ABNORMAL HIGH (ref 0.7–4.0)
MCH: 29.7 pg (ref 26.0–34.0)
MCHC: 35.4 g/dL (ref 30.0–36.0)
MCV: 84 fL (ref 80.0–100.0)
Monocytes Absolute: 1.4 K/uL — ABNORMAL HIGH (ref 0.1–1.0)
Monocytes Relative: 9 %
Neutro Abs: 7.9 K/uL — ABNORMAL HIGH (ref 1.7–7.7)
Neutrophils Relative %: 54 %
Platelets: 532 K/uL — ABNORMAL HIGH (ref 150–400)
RBC: 3.13 MIL/uL — ABNORMAL LOW (ref 3.87–5.11)
RDW: 14.2 % (ref 11.5–15.5)
WBC: 14.5 K/uL — ABNORMAL HIGH (ref 4.0–10.5)
nRBC: 0 % (ref 0.0–0.2)

## 2024-05-18 LAB — RETICULOCYTES
Immature Retic Fract: 38.1 % — ABNORMAL HIGH (ref 2.3–15.9)
RBC.: 3.11 MIL/uL — ABNORMAL LOW (ref 3.87–5.11)
Retic Count, Absolute: 117.2 K/uL (ref 19.0–186.0)
Retic Ct Pct: 3.8 % — ABNORMAL HIGH (ref 0.4–3.1)

## 2024-05-18 LAB — RESP PANEL BY RT-PCR (RSV, FLU A&B, COVID)  RVPGX2
Influenza A by PCR: NEGATIVE
Influenza B by PCR: NEGATIVE
Resp Syncytial Virus by PCR: NEGATIVE
SARS Coronavirus 2 by RT PCR: NEGATIVE

## 2024-05-18 LAB — GROUP A STREP BY PCR: Group A Strep by PCR: NOT DETECTED

## 2024-05-18 LAB — HCG, SERUM, QUALITATIVE: Preg, Serum: NEGATIVE

## 2024-05-18 LAB — MONONUCLEOSIS SCREEN: Mono Screen: NEGATIVE

## 2024-05-18 LAB — I-STAT CG4 LACTIC ACID, ED: Lactic Acid, Venous: 0.6 mmol/L (ref 0.5–1.9)

## 2024-05-18 MED ORDER — HYDROMORPHONE HCL 1 MG/ML IJ SOLN
2.0000 mg | INTRAMUSCULAR | Status: AC
  Administered 2024-05-18: 2 mg via INTRAVENOUS
  Filled 2024-05-18: qty 2

## 2024-05-18 MED ORDER — ONDANSETRON HCL 4 MG/2ML IJ SOLN: 4.0000 mg | INTRAMUSCULAR | Status: DC | PRN

## 2024-05-18 MED ORDER — KETOROLAC TROMETHAMINE 15 MG/ML IJ SOLN
15.0000 mg | INTRAMUSCULAR | Status: AC
  Administered 2024-05-18: 15 mg via INTRAVENOUS
  Filled 2024-05-18: qty 1

## 2024-05-18 MED ORDER — ACETAMINOPHEN 500 MG PO TABS
1000.0000 mg | ORAL_TABLET | Freq: Once | ORAL | Status: AC
  Administered 2024-05-18: 1000 mg via ORAL
  Filled 2024-05-18: qty 2

## 2024-05-18 MED ORDER — SODIUM CHLORIDE 0.45 % IV SOLN: INTRAVENOUS | Status: DC

## 2024-05-18 NOTE — ED Triage Notes (Signed)
 Pt arrived reporting sore throat, bodyache, fever since last week, has been taking otc meds.States symptoms are trigger her sickle cell and she is starting to feel pain all over.Carmen [name]

## 2024-05-18 NOTE — ED Provider Notes (Signed)
 [place] EMERGENCY DEPARTMENT AT [facility] Provider Note   CSN: [id number] Arrival date & time: 05/18/24  9178     Patient presents with: Sore Throat, Headache, and Generalized Body Aches   Carmen [name] is a 33 y.o. female.   HPI Patient reports that she has had a sore throat since last week.  She reports her voice has gotten more hoarse and she has tried multiple over-the-counter preparations including TheraFlu, throat lozenges, Mucinex  without any relief.  Patient reports she has had some pain up to her ears when she swallows but no earache or generalized headache.  Patient reports she has had bodyaches and maybe a fever last week but none documented recently.  No chest pain or shortness of breath.  Patient reports after being sick all week she thinks it is triggering her sickle cell pain as well and now she has generalized aching pain.    Prior to Admission medications   Medication Sig Start Date End Date Taking? Authorizing Provider  BENADRYL  ALLERGY 25 MG capsule Take 25 mg by mouth every 6 (six) hours as needed for allergies.    [provider]  cyclobenzaprine  (FLEXERIL ) 10 MG tablet TAKE 1 TABLET BY MOUTH DAILY AT BEDTIME AS NEEDED FOR MUSCLE SPASMS 04/27/24   Paseda, Folashade R, FNP  folic acid  (FOLVITE ) 1 MG tablet Take 1 tablet (1 mg total) by mouth daily. 12/27/23   Paseda, Folashade R, FNP  morphine  (MS CONTIN ) 60 MG 12 hr tablet Take 1 tablet (60 mg total) by mouth every 12 (twelve) hours. 05/03/24   [name], [name], MD  Oxycodone  HCl 10 MG TABS Take 1 tablet (10 mg total) by mouth every 6 (six) hours as needed (pain). 05/15/24   Paseda, Folashade R, FNP  Vitamin D , Ergocalciferol , (DRISDOL ) 1.25 MG (50000 UNIT) CAPS capsule TAKE 1 CAPSULE BY MOUTH ONE TIME PER WEEK Patient taking differently: Take 50,000 Units by mouth every 7 (seven) days. On Wednesday 12/27/23   Paseda, Folashade R, FNP    Allergies: Ketamine     Review of Systems  Updated  Vital Signs BP 119/83   Pulse 76   Temp 98.3 F (36.8 C) (Oral)   Resp 19   Ht 5' 3 (1.6 m)   Wt 86.2 kg   LMP 04/23/2024   SpO2 100%   BMI 33.66 kg/m   Physical Exam Constitutional:      Comments: Patient is alert nontoxic.  Well in appearance.  No acute distress.  No respiratory distress.  HENT:     Head: Normocephalic and atraumatic.     Right Ear: Tympanic membrane normal.     Left Ear: Tympanic membrane normal.     Nose: Nose normal.     Mouth/Throat:     Mouth: Mucous membranes are moist.     Pharynx: Oropharynx is clear.     Comments: The patient has mild erythema of the tonsillar pillars.  No exudates and no tonsillar enlargement.  Posterior oropharynx is widely patent.  Voice is slightly hoarse but speaking without difficulty. Eyes:     Extraocular Movements: Extraocular movements intact.     Conjunctiva/sclera: Conjunctivae normal.     Pupils: Pupils are equal, round, and reactive to light.  Neck:     Comments: Neck is supple.  No lymphadenopathy no meningismus no significant tenderness to palpation.  No stridor. Cardiovascular:     Rate and Rhythm: Normal rate and regular rhythm.  Pulmonary:     Effort: Pulmonary effort is  normal.     Breath sounds: Normal breath sounds.     Comments: Clear to auscultation.  No respiratory distress. Abdominal:     General: There is no distension.     Palpations: Abdomen is soft.     Tenderness: There is no abdominal tenderness. There is no guarding.  Musculoskeletal:        General: No swelling or tenderness. Normal range of motion.     Right lower leg: No edema.     Left lower leg: No edema.  Skin:    General: Skin is warm and dry.  Neurological:     General: No focal deficit present.     Mental Status: She is oriented to person, place, and time.     Motor: No weakness.     Coordination: Coordination normal.     (all labs ordered are listed, but only abnormal results are displayed) Labs Reviewed  CBC WITH  DIFFERENTIAL/PLATELET - Abnormal; Notable for the following components:      Result Value   WBC 14.5 (*)    RBC 3.13 (*)    Hemoglobin 9.3 (*)    HCT 26.3 (*)    Platelets 532 (*)    Neutro Abs 7.9 (*)    Lymphs Abs 5.0 (*)    Monocytes Absolute 1.4 (*)    Abs Immature Granulocytes 0.09 (*)    All other components within normal limits  RETICULOCYTES - Abnormal; Notable for the following components:   Retic Ct Pct 3.8 (*)    RBC. 3.11 (*)    Immature Retic Fract 38.1 (*)    All other components within normal limits  COMPREHENSIVE METABOLIC PANEL WITH GFR - Abnormal; Notable for the following components:   Potassium 3.4 (*)    All other components within normal limits  GROUP A STREP BY PCR  RESP PANEL BY RT-PCR (RSV, FLU A&B, COVID)  RVPGX2  MONONUCLEOSIS SCREEN  HCG, SERUM, QUALITATIVE  URINALYSIS, ROUTINE W REFLEX MICROSCOPIC  I-STAT CG4 LACTIC ACID, ED  I-STAT CG4 LACTIC ACID, ED    EKG: None  Radiology: No results found.   Procedures   Medications Ordered in the ED  0.45 % sodium chloride  infusion ( Intravenous New Bag/Given 05/18/24 1138)  ondansetron  (ZOFRAN ) injection 4 mg (has no administration in time range)  HYDROmorphone  (DILAUDID ) injection 2 mg (2 mg Intravenous Given 05/18/24 1136)  HYDROmorphone  (DILAUDID ) injection 2 mg (2 mg Intravenous Given 05/18/24 1204)  HYDROmorphone  (DILAUDID ) injection 2 mg (2 mg Intravenous Given 05/18/24 1257)  ketorolac  (TORADOL ) 15 MG/ML injection 15 mg (15 mg Intravenous Given 05/18/24 1136)  acetaminophen  (TYLENOL ) tablet 1,000 mg (1,000 mg Oral Given 05/18/24 1322)                                    Medical Decision Making Amount and/or Complexity of Data Reviewed Labs: ordered. Radiology: ordered.  Risk OTC drugs. Prescription drug management.   Patient presents with symptoms and presentation suggestive of viral upper respiratory illness with pharyngitis.  Clinically patient is well in appearance.  She has had about  a week of a sore throat.  Voice is hoarse but easily intelligible and speaking without difficulty no stridor no respiratory distress.  Physical exam shows mild erythema of the tonsillar pillars but no exudates or posterior airway swelling.  Will proceed with strep, mono and respiratory panel.  In light of patient's sickle cell and report of generalized  pain being triggered as well, will initiate sickle cell pain crisis management.  I-STAT lactic 0.6 Mono screen negative white count 14.5 hemoglobin 9.3 pregnancy serum negative metabolic panel normal.  Patient was treated with Toradol , hydration and Dilaudid .  The patient became irate stating that she was not being treated for the pain that she came in for which was her sore throat and that I was ignoring her actual complaint and only treating the sickle cell pain. I explained that the therapy was geared towards both problems and that nonsteroidal anti-inflammatory are the typical treatment for head and neck pain associated with URI and Dilaudid  should be helpful as well although it was ordered in conjunction of the sickle cell management by her report of generalized pain that she felt was a sickle cell crisis triggered by her recent URI.  She insisted that the Dilaudid  was only for chronic pain management and was not the right medicine for helping her other pain.  I had also ordered Tylenol .  The patient became more irate at this management, although I explained that we could do things like salt water  gargle, she felt that I was ignoring her needs by not providing some type of topical therapy that would give her immediate relief.  Since I did not have other suggestions for immediate topical relief, the patient stated that I did not know what I was doing and I agreed I might not in the sense that I do not know how to immediately relieve topical pain, and typically NSAIDs and narcotics are very effective for basically any type of acute pain and I would use them in  cases such as more advanced infections of the throat.  These explanations only escalated the situation and the patient demanded to leave.  At this time, patient is stable for discharge.  No signs of any airway impingement or closure, patient is nontoxic with stable vital signs.  Currently this does not appear to be a bacterial pharyngitis.  I have suggested close follow-up and continuing with OTC therapies for pharyngitis.     Final diagnoses:  Viral upper respiratory tract infection  Pharyngitis, unspecified etiology  Sickle cell disease with crisis [facility])    ED Discharge Orders     None          Armenta Canning, MD 05/18/24 1340

## 2024-05-18 NOTE — Discharge Instructions (Signed)
 1.  Follow-up with your family doctor for recheck as soon as possible. 2.  At this time your tests are negative for strep throat, mono, COVID, influenza.  Your symptoms are most suggestive of a viral illness.  Viruses are common causes for sore throat.  Treatment for pain includes over-the-counter ibuprofen , extra strength Tylenol  and throat sprays and gargles that you can buy at the pharmacy. 3.  Return to the emergency department if you develop a fever, difficulty breathing, cough productive of mucus or blood or other concerning changes.

## 2024-05-18 NOTE — ED Notes (Signed)
 Pt aware a urine sample is needed and a cup is at bedside.

## 2024-05-18 NOTE — ED Notes (Signed)
 USGIV attempted and was unsuccessful x1.

## 2024-05-25 ENCOUNTER — Ambulatory Visit: Payer: MEDICAID | Admitting: Nurse Practitioner

## 2024-05-28 ENCOUNTER — Encounter: Payer: Self-pay | Admitting: Nurse Practitioner

## 2024-05-28 ENCOUNTER — Ambulatory Visit (INDEPENDENT_AMBULATORY_CARE_PROVIDER_SITE_OTHER): Payer: MEDICAID | Admitting: Nurse Practitioner

## 2024-05-28 VITALS — BP 119/70 | HR 62 | Temp 97.2°F | Wt 173.0 lb

## 2024-05-28 DIAGNOSIS — E559 Vitamin D deficiency, unspecified: Secondary | ICD-10-CM

## 2024-05-28 DIAGNOSIS — E876 Hypokalemia: Secondary | ICD-10-CM

## 2024-05-28 DIAGNOSIS — Z0000 Encounter for general adult medical examination without abnormal findings: Secondary | ICD-10-CM

## 2024-05-28 DIAGNOSIS — Z0120 Encounter for dental examination and cleaning without abnormal findings: Secondary | ICD-10-CM | POA: Insufficient documentation

## 2024-05-28 DIAGNOSIS — F1290 Cannabis use, unspecified, uncomplicated: Secondary | ICD-10-CM

## 2024-05-28 DIAGNOSIS — Z13220 Encounter for screening for lipoid disorders: Secondary | ICD-10-CM

## 2024-05-28 DIAGNOSIS — D571 Sickle-cell disease without crisis: Secondary | ICD-10-CM

## 2024-05-28 DIAGNOSIS — E669 Obesity, unspecified: Secondary | ICD-10-CM

## 2024-05-28 MED ORDER — NALOXONE HCL 4 MG/0.1ML NA LIQD: NASAL | 2 refills | Status: AC

## 2024-05-28 NOTE — Patient Instructions (Addendum)
 Eye doctor  [address] [place] [address]  P:  [phone]   At a minimum, adult patients with SCD should complete as a primary series (per CDC Adult Immunization Schedule for patients with additional risk factor of functional asplenia; updated April 28, 2023)  Meningococcal ACWY  -Menveo or MenQuadfi - 2 doses at least 8 weeks apart; then re-vaccinate every 5 years  Meningococcal B -Bexsero - 2 doses at least 4 weeks apart OR -Trumenba - 3 doses at month 0, 1-2, and 6 THEN Booster 1 year later and revaccinate every 2-3 years  Haemophilus Influenzae type B (HIB) -1 dose if not previously received  Pneumococcal -if no pneumococcal vaccines received, administer PCV20 x1 -if patient has received PPSV23 or PCV13 previously, administer PCV20 x1 at least 1 year after PPSV23/PCV13 -if patient has received a single dose of PPSV23 and PCV13, then administer PCV20 at least 5 years after PPSV23/PCV13 -if patient has received a two doses of PPSV23 and one dose of PCV13, then administer PCV20 at least 5 years after PPSV23/PCV13 OR patient may be done with series  We also suggest: Immunization for Hepatitis B (Twinrix at 0, 1 and 6 month intervals. For patients receiving regular blood transfusions, have history of HIV, current or recent drug use, or incarcerated)  Tdap (booster every 10 years, and goal of all pregnant woman) - If no history of primary vaccination series for tetanus, diphtheria, or pertussis: At least 1 dose Tdap followed by 1 dose Td or Tdap at least 4 weeks later, another dose Td or Tdap 6-12 months later, and booster every 10 years. Also 1 dose Tdap during each pregnancy.      It is important that you exercise regularly at least 30 minutes 5 times a week as tolerated  Think about what you will eat, plan ahead. Choose  clean, green, fresh or frozen over canned, processed or packaged foods which are more sugary, salty and fatty. 70 to 75% of food eaten should be  vegetables and fruit. Three meals at set times with snacks allowed between meals, but they must be fruit or vegetables. Aim to eat over a 12 hour period , example 7 am to 7 pm, and STOP after  your last meal of the day. Drink water ,generally about 64 ounces per day, no other drink is as healthy. Fruit juice is best enjoyed in a healthy way, by EATING the fruit.  Thanks for choosing [facility] we consider it a privelige to serve you.

## 2024-05-28 NOTE — Assessment & Plan Note (Addendum)
 Last vitamin D  Lab Results  Component Value Date   VD25OH 38.4 02/24/2024  On vitamin D  50,000 units once weekly Continue current medication

## 2024-05-28 NOTE — Assessment & Plan Note (Signed)
 Smokes marijuana on and off Need to quit smoking discussed

## 2024-05-28 NOTE — Assessment & Plan Note (Signed)
 Wt Readings from Last 3 Encounters:  05/28/24 173 lb (78.5 kg)  05/18/24 190 lb (86.2 kg)  03/31/24 190 lb (86.2 kg)   Body mass index is 30.65 kg/m.  Patient counseled on low-carb diet Encouraged to engage in regular moderate exercises at least 150 minutes weekly as tolerated Benefits of healthy weights discussed

## 2024-05-28 NOTE — Assessment & Plan Note (Signed)
Annual exam as documented.  ?Counseling done include healthy lifestyle involving committing to 150 minutes of exercise per week, heart healthy diet, and attaining healthy weight. The importance of adequate sleep also discussed.  ?Regular use of seat belt and home safety were also discussed . ?Changes in health habits are decided on by patient with goals and time frames set for achieving them. ?Immunization and cancer screening  needs are specifically addressed at this visit.   ?

## 2024-05-28 NOTE — Assessment & Plan Note (Addendum)
 Her sickle cell pain is currently rated 4/10, oxycodone  10 milligram tablet was last taken 2 days ago ,MS Contin  last taken today. Continue folic acid  1 mg daily Continue MS Contin  60 mg every 12 hours, oxycodone  10 mg every 6 hours as needed for chronic pain we reviewed the terms of our pain agreement, including the need to keep medicines in a safe locked location away from children or pets, and the need to report excess sedation or constipation, measures to avoid constipation.  Narcan  ordered as needed for opioid overdose We discussed the need for good hydration, monitoring of hydration status, avoidance of heat, cold, stress, and infection triggers. Patient was reminded of the need to seek medical attention immediately if any symptom of bleeding, anemia, or infection occurs.

## 2024-05-28 NOTE — Progress Notes (Signed)
 Complete physical exam  Patient: Carmen [name]   DOB: 05-Jun-1991   33 y.o. Female  MRN: [id number]  Subjective:    Chief Complaint  Patient presents with   Sickle Cell Anemia   Annual Exam    Carmen [name] is a 33 y.o. female  has a past medical history of Anxiety, Headache(784.0), Heart murmur, Sickle cell crisis (HCC), Syphilis (2015), and Thrombocytosis (11/22/2014). who presents today for a complete physical exam. She reports consuming a general diet. Goes to the park everyday for walking exercises She generally feels well. She reports sleeping well.   She was at the emergency department on 05/18/2024 for complaints of sore throat mononucleosis and strep test was negative, states that her sore throat has since resolved  Sees gynecologist in August for Nexplanon implant    Most recent fall risk assessment:    08/26/2021    3:19 PM  Fall Risk   Falls in the past year? 0  Number falls in past yr: 0  Injury with Fall? 0     Most recent depression screenings:    05/28/2024   10:20 AM 02/24/2024    9:25 AM  PHQ 2/9 Scores  PHQ - 2 Score 0 0  PHQ- 9 Score 2         Patient Care Team: Reyah Streeter R, FNP as PCP - General (Nurse Practitioner) Rounding, Sickle Cell, MD as Rounding Team (Internal Medicine)   Outpatient Medications Prior to Visit  Medication Sig   BENADRYL  ALLERGY 25 MG capsule Take 25 mg by mouth every 6 (six) hours as needed for allergies.   cyclobenzaprine  (FLEXERIL ) 10 MG tablet TAKE 1 TABLET BY MOUTH DAILY AT BEDTIME AS NEEDED FOR MUSCLE SPASMS   folic acid  (FOLVITE ) 1 MG tablet Take 1 tablet (1 mg total) by mouth daily.   morphine  (MS CONTIN ) 60 MG 12 hr tablet Take 1 tablet (60 mg total) by mouth every 12 (twelve) hours.   Oxycodone  HCl 10 MG TABS Take 1 tablet (10 mg total) by mouth every 6 (six) hours as needed (pain).   Vitamin D , Ergocalciferol , (DRISDOL ) 1.25 MG (50000 UNIT) CAPS capsule TAKE 1 CAPSULE BY MOUTH ONE TIME PER WEEK   No  facility-administered medications prior to visit.    Review of Systems  Constitutional:  Negative for appetite change, chills, fatigue and fever.  HENT:  Negative for congestion, postnasal drip, rhinorrhea and sneezing.   Eyes:  Negative for pain, discharge and itching.  Respiratory:  Negative for cough, shortness of breath and wheezing.   Cardiovascular:  Negative for chest pain, palpitations and leg swelling.  Gastrointestinal:  Negative for abdominal pain, constipation, nausea and vomiting.  Endocrine: Negative for cold intolerance, heat intolerance and polydipsia.  Genitourinary:  Negative for difficulty urinating, dysuria, flank pain and frequency.  Musculoskeletal:  Positive for arthralgias. Negative for joint swelling and myalgias.  Skin:  Negative for color change, pallor, rash and wound.  Neurological:  Negative for dizziness, facial asymmetry, weakness, numbness and headaches.  Psychiatric/Behavioral:  Negative for behavioral problems, confusion, self-injury and suicidal ideas.        Objective:     BP 119/70   Pulse 62   Temp (!) 97.2 F (36.2 C)   Wt 173 lb (78.5 kg)   LMP 04/23/2024   SpO2 100%   BMI 30.65 kg/m    Physical Exam Vitals and nursing note reviewed. Exam conducted with a chaperone present.  Constitutional:      General: She  is not in acute distress.    Appearance: Normal appearance. She is obese. She is not ill-appearing, toxic-appearing or diaphoretic.  HENT:     Right Ear: Tympanic membrane, ear canal and external ear normal. There is no impacted cerumen.     Left Ear: Tympanic membrane, ear canal and external ear normal. There is no impacted cerumen.     Nose: Nose normal. No congestion or rhinorrhea.     Mouth/Throat:     Pharynx: Oropharynx is clear. No oropharyngeal exudate or posterior oropharyngeal erythema.  Eyes:     General: Scleral icterus present.        Right eye: No discharge.        Left eye: No discharge.     Extraocular  Movements: Extraocular movements intact.  Neck:     Vascular: No carotid bruit.  Cardiovascular:     Rate and Rhythm: Normal rate and regular rhythm.     Pulses: Normal pulses.     Heart sounds: Normal heart sounds. No murmur heard.    No friction rub. No gallop.  Pulmonary:     Effort: Pulmonary effort is normal. No respiratory distress.     Breath sounds: Normal breath sounds. No stridor. No wheezing, rhonchi or rales.  Chest:     Chest wall: No mass, lacerations, deformity, swelling, tenderness, crepitus or edema.  Breasts:    Tanner Score is 5.     Right: Normal. No swelling, bleeding, inverted nipple, mass, nipple discharge, skin change or tenderness.     Left: Normal. No swelling, bleeding, inverted nipple, mass, nipple discharge, skin change or tenderness.  Abdominal:     General: Bowel sounds are normal. There is no distension.     Palpations: Abdomen is soft. There is no mass.     Tenderness: There is no abdominal tenderness. There is no right CVA tenderness, left CVA tenderness, guarding or rebound.     Hernia: No hernia is present.  Musculoskeletal:        General: No swelling, deformity or signs of injury.     Cervical back: Normal range of motion and neck supple. No rigidity or tenderness.     Right lower leg: No edema.     Left lower leg: No edema.  Lymphadenopathy:     Cervical: No cervical adenopathy.  Skin:    General: Skin is warm and dry.     Capillary Refill: Capillary refill takes less than 2 seconds.     Coloration: Skin is not jaundiced.     Findings: No erythema, lesion or rash.  Neurological:     Mental Status: She is alert and oriented to person, place, and time.     Cranial Nerves: No cranial nerve deficit.     Motor: No weakness.     Gait: Gait normal.  Psychiatric:        Mood and Affect: Mood normal.        Behavior: Behavior normal.        Thought Content: Thought content normal.        Judgment: Judgment normal.     No results found for  any visits on 05/28/24.     Assessment & Plan:    Routine Health Maintenance and Physical Exam  Immunization History  Administered Date(s) Administered   Influenza, Seasonal, Injecte, Preservative Fre 08/03/2023   Influenza,inj,Quad PF,6+ Mos 11/13/2013, 09/10/2015, 08/17/2016, 07/18/2017, 06/22/2018, 12/31/2019, 10/16/2020, 08/26/2021, 07/15/2022, 08/14/2022   Influenza-Unspecified 11/13/2013, 09/10/2015, 08/17/2016, 07/18/2017   PFIZER(Purple Top)SARS-COV-2 Vaccination 02/11/2020,  03/03/2020   PNEUMOCOCCAL CONJUGATE-20 08/03/2023   Pneumococcal Polysaccharide-23 11/13/2013   Tdap 11/11/2011, 11/29/2018    Health Maintenance  Topic Date Due   Meningococcal B Vaccine (1 of 4 - Increased Risk) Never done   Hepatitis B Vaccines (1 of 3 - 19+ 3-dose series) Never done   HPV VACCINES (1 - 3-dose SCDM series) Never done   COVID-19 Vaccine (3 - 2024-25 season) 07/03/2023   INFLUENZA VACCINE  06/01/2024   Cervical Cancer Screening (HPV/Pap Cotest)  01/05/2026   DTaP/Tdap/Td (3 - Td or Tdap) 11/29/2028   Pneumococcal Vaccine 33-50 Years old  Completed   Hepatitis C Screening  Completed   HIV Screening  Completed    Discussed health benefits of physical activity, and encouraged her to engage in regular exercise appropriate for her age and condition.  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 Continue folic acid  1 mg daily Continue MS Contin  60 mg every 12 hours, oxycodone  10 mg every 6 hours as needed for chronic pain we reviewed the terms of our pain agreement, including the need to keep medicines in a safe locked location away from children or pets, and the need to report excess sedation or constipation, measures to avoid constipation.  Narcan  ordered as needed for opioid overdose We discussed  the need for good hydration, monitoring of hydration status, avoidance of heat, cold, stress, and infection triggers. Patient was reminded of the need to seek medical attention immediately if any symptom of bleeding, anemia, or infection occurs.        Relevant Medications   naloxone  (NARCAN ) nasal spray 4 mg/0.1 mL   Other Relevant Orders   Ferritin   ToxAssure Flex 15, Ur   Hypokalemia   Relevant Orders   Potassium   Vitamin D  deficiency   Last vitamin D  Lab Results  Component Value Date   VD25OH 38.4 02/24/2024  On vitamin D  50,000 units once weekly Continue current medication      Relevant Orders   VITAMIN D  25 Hydroxy (Vit-D Deficiency, Fractures)   Marijuana smoker   Smokes marijuana on and off Need to quit smoking discussed      Obesity (BMI 30-39.9)   Wt Readings from Last 3 Encounters:  05/28/24 173 lb (78.5 kg)  05/18/24 190 lb (86.2 kg)  03/31/24 190 lb (86.2 kg)   Body mass index is 30.65 kg/m.  Patient counseled on low-carb diet Encouraged to engage in regular moderate exercises at least 150 minutes weekly as tolerated Benefits of healthy weights discussed      Annual physical exam - Primary   Annual exam as documented.  Counseling done include healthy lifestyle involving committing to 150 minutes of exercise per week, heart healthy diet, and attaining healthy weight. The importance of adequate sleep also discussed.  Regular use of seat belt and home safety were also discussed . Changes in health habits are decided on by patient with goals and time frames set for achieving them. Immunization and cancer screening  needs are specifically addressed at this visit.            Encounter for routine dental examination   Relevant Orders   Ambulatory referral to Dentistry   Other Visit Diagnoses       Screening for lipid disorders  Relevant Orders   Lipid panel      Return in about 3 months (around 08/28/2024).     Ricahrd Schwager R Jernie Schutt,  FNP

## 2024-05-29 ENCOUNTER — Ambulatory Visit: Payer: Self-pay | Admitting: Nurse Practitioner

## 2024-05-29 LAB — LIPID PANEL
Chol/HDL Ratio: 3.6 ratio (ref 0.0–4.4)
Cholesterol, Total: 142 mg/dL (ref 100–199)
HDL: 40 mg/dL (ref >39)
LDL Chol Calc (NIH): 87 mg/dL (ref 0–99)
Triglycerides: 78 mg/dL (ref 0–149)
VLDL Cholesterol Cal: 15 mg/dL (ref 5–40)

## 2024-05-29 LAB — VITAMIN D 25 HYDROXY (VIT D DEFICIENCY, FRACTURES): Vit D, 25-Hydroxy: 34.5 ng/mL (ref 30.0–100.0)

## 2024-05-29 LAB — FERRITIN: Ferritin: 320 ng/mL — ABNORMAL HIGH (ref 15–150)

## 2024-05-29 LAB — POTASSIUM: Potassium: 4.3 mmol/L (ref 3.5–5.2)

## 2024-05-30 ENCOUNTER — Other Ambulatory Visit: Payer: Self-pay | Admitting: Nurse Practitioner

## 2024-05-30 DIAGNOSIS — D571 Sickle-cell disease without crisis: Secondary | ICD-10-CM

## 2024-05-30 DIAGNOSIS — Z79891 Long term (current) use of opiate analgesic: Secondary | ICD-10-CM

## 2024-05-30 MED ORDER — OXYCODONE HCL 10 MG PO TABS: 10.0000 mg | ORAL_TABLET | Freq: Four times a day (QID) | ORAL | 0 refills | Status: DC | PRN

## 2024-05-30 NOTE — Telephone Encounter (Signed)
 Reviewed PDMP substance reporting system prior to prescribing opiate medications. No inconsistencies noted.    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  Dispense: 60 tablet; Refill: 0  2. Chronic prescription opiate use  - Oxycodone  HCl 10 MG TABS; Take 1 tablet (10 mg total) by mouth every 6 (six) hours as needed (pain).  Dispense: 60 tablet; Refill: 0

## 2024-05-30 NOTE — Telephone Encounter (Signed)
 Copied from CRM [id number]. Topic: Clinical - Medication Refill >> May 30, 2024 11:54 AM Carmen [name] wrote: Medication: Oxycodone  HCl 10 MG TABS  Has the patient contacted their pharmacy? Yes (Agent: If no, request that the patient contact the pharmacy for the refill. If patient does not wish to contact the pharmacy document the reason why and proceed with request.) (Agent: If yes, when and what did the pharmacy advise?)  This is the patient's preferred pharmacy:  [id number] [place] [place] - [address] AT [facility] [address] [place] [address] Phone: [phone] Fax: [phone]  Is this the correct pharmacy for this prescription? Yes If no, delete pharmacy and type the correct one.   Has the prescription been filled recently? Yes  Is the patient out of the medication? No  Has the patient been seen for an appointment in the last year OR does the patient have an upcoming appointment? Yes  Can we respond through MyChart? Yes  Agent: Please be advised that Rx refills may take up to 3 business days. We ask that you follow-up with your pharmacy.

## 2024-05-31 LAB — TOXASSURE FLEX 15, UR
6-ACETYLMORPHINE IA: NEGATIVE ng/mL
7-aminoclonazepam: NOT DETECTED ng/mg{creat}
Alpha-hydroxyalprazolam: NOT DETECTED ng/mg{creat}
Alpha-hydroxymidazolam: NOT DETECTED ng/mg{creat}
Alpha-hydroxytriazolam: NOT DETECTED ng/mg{creat}
Alprazolam: NOT DETECTED ng/mg{creat}
BARBITURATES IA: NEGATIVE ng/mL
Buprenorphine: NOT DETECTED ng/mg{creat}
COCAINE METABOLITE IA: NEGATIVE ng/mL
Clonazepam: NOT DETECTED ng/mg{creat}
Creatinine: 117 mg/dL (ref >=20)
Desalkylflurazepam: NOT DETECTED ng/mg{creat}
Desmethyldiazepam: NOT DETECTED ng/mg{creat}
Desmethylflunitrazepam: NOT DETECTED ng/mg{creat}
Diazepam: NOT DETECTED ng/mg{creat}
ETHYL ALCOHOL Enzymatic: NEGATIVE g/dL
Fentanyl: NOT DETECTED ng/mg{creat}
Flunitrazepam: NOT DETECTED ng/mg{creat}
Lorazepam: NOT DETECTED ng/mg{creat}
METHADONE IA: NEGATIVE ng/mL
METHADONE MTB IA: NEGATIVE ng/mL
Midazolam: NOT DETECTED ng/mg{creat}
Norbuprenorphine: NOT DETECTED ng/mg{creat}
Norfentanyl: NOT DETECTED ng/mg{creat}
Oxazepam: NOT DETECTED ng/mg{creat}
PHENCYCLIDINE IA: NEGATIVE ng/mL
TAPENTADOL, IA: NEGATIVE ng/mL
TRAMADOL IA: NEGATIVE ng/mL
Temazepam: NOT DETECTED ng/mg{creat}

## 2024-05-31 LAB — AMPHETAMINES, MS, UR RFX
Amphetamine: NOT DETECTED ng/mg{creat}
Amphetamines Confirmation: NEGATIVE
MDA (Ecstasy metabolite): NOT DETECTED ng/mg{creat}
MDMA (Ecstasy): NOT DETECTED ng/mg{creat}
Methamphetamine: NOT DETECTED ng/mg{creat}

## 2024-05-31 LAB — CANNABINOIDS, MS, UR RFX
Cannabinoids Confirmation: POSITIVE
Carboxy-THC: >855 ng/mg{creat}

## 2024-05-31 LAB — OPIATE CLASS, MS, UR RFX
Codeine: NOT DETECTED ng/mg{creat}
Dihydrocodeine: NOT DETECTED ng/mg{creat}
Hydrocodone: NOT DETECTED ng/mg{creat}
Hydromorphone: 778 ng/mg{creat}
Morphine: >8547 ng/mg{creat}
Norcodeine: NOT DETECTED ng/mg{creat}
Norhydrocodone: NOT DETECTED ng/mg{creat}
Normorphine: 5448 ng/mg{creat}
Opiate Class Confirmation: POSITIVE

## 2024-05-31 LAB — OXYCODONE CLASS, MS, UR RFX
Noroxycodone: 392 ng/mg{creat}
Noroxymorphone: 222 ng/mg{creat}
Oxycodone Class Confirmation: POSITIVE
Oxycodone: 56 ng/mg{creat}
Oxymorphone: 206 ng/mg{creat}

## 2024-06-05 ENCOUNTER — Other Ambulatory Visit: Payer: Self-pay

## 2024-06-05 DIAGNOSIS — Z79891 Long term (current) use of opiate analgesic: Secondary | ICD-10-CM

## 2024-06-05 DIAGNOSIS — D571 Sickle-cell disease without crisis: Secondary | ICD-10-CM

## 2024-06-05 MED ORDER — MORPHINE SULFATE ER 60 MG PO TBCR
60.0000 mg | EXTENDED_RELEASE_TABLET | Freq: Two times a day (BID) | ORAL | 0 refills | Status: DC
Start: 2024-06-08 — End: 2024-06-08

## 2024-06-05 NOTE — Telephone Encounter (Signed)
 Reviewed PDMP substance reporting system prior to prescribing opiate medications. No inconsistencies noted.   1. 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  Dispense: 60 tablet; Refill: 0  2. Chronic prescription opiate use  - morphine (MS CONTIN) 60 MG 12 hr tablet; Take 1 tablet (60 mg total) by mouth every 12 (twelve) hours.  Dispense: 60 tablet; Refill: 0

## 2024-06-05 NOTE — Telephone Encounter (Signed)
 Please advise [facility]

## 2024-06-07 ENCOUNTER — Telehealth: Payer: Self-pay

## 2024-06-07 NOTE — Telephone Encounter (Signed)
 Routing encounter to PCP office to review & handle.   Copied from CRM [id number]. Topic: Clinical - Prescription Issue >> Jun 07, 2024  2:54 PM Donna BRAVO wrote: Reason for CRM: patient calling regarding refill  for  morphine  (MS CONTIN ) 60 MG 12 hr tablet   patient spoke to the pharmacy 06/07/24 around 2:30pm pharmacy stated they have not received the prescription. E-Prescribing Status: Transmission to pharmacy failed (06/05/2024  8:46 AM EDT)  Patient uses MyChart and will respond to messages.    Patient was made aware their call will be returned in 1 business day.   Patient pharmacy  [id number] [place] - [address] AT [facility] [address] [place] [address] Phone: [phone] Fax: [phone]

## 2024-06-08 ENCOUNTER — Other Ambulatory Visit: Payer: Self-pay | Admitting: Nurse Practitioner

## 2024-06-08 DIAGNOSIS — D571 Sickle-cell disease without crisis: Secondary | ICD-10-CM

## 2024-06-08 DIAGNOSIS — Z79891 Long term (current) use of opiate analgesic: Secondary | ICD-10-CM

## 2024-06-08 MED ORDER — MORPHINE SULFATE ER 60 MG PO TBCR: 60.0000 mg | EXTENDED_RELEASE_TABLET | Freq: Two times a day (BID) | ORAL | 0 refills | Status: DC

## 2024-06-08 NOTE — Progress Notes (Signed)
.    Enough quantity of Morphine  not available at patient's pharmacy, she requested [facility] [address].  [facility] [address] [facility] there.

## 2024-06-11 NOTE — Telephone Encounter (Signed)
 Lvm for pt to advise if her medication. KH

## 2024-06-13 ENCOUNTER — Other Ambulatory Visit: Payer: Self-pay | Admitting: Nurse Practitioner

## 2024-06-13 ENCOUNTER — Encounter: Payer: MEDICAID | Admitting: Radiology

## 2024-06-13 DIAGNOSIS — D571 Sickle-cell disease without crisis: Secondary | ICD-10-CM

## 2024-06-13 DIAGNOSIS — Z79891 Long term (current) use of opiate analgesic: Secondary | ICD-10-CM

## 2024-06-13 MED ORDER — OXYCODONE HCL 10 MG PO TABS: 10.0000 mg | ORAL_TABLET | Freq: Four times a day (QID) | ORAL | 0 refills | Status: DC | PRN

## 2024-06-13 NOTE — Progress Notes (Signed)
 Reviewed PDMP substance reporting system prior to prescribing opiate medications. No inconsistencies noted.     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  Dispense: 60 tablet; Refill: 0  2. Chronic prescription opiate use  - Oxycodone HCl 10 MG TABS; Take 1 tablet (10 mg total) by mouth every 6 (six) hours as needed (pain).  Dispense: 60 tablet; Refill: 0

## 2024-06-27 ENCOUNTER — Other Ambulatory Visit: Payer: Self-pay

## 2024-06-27 DIAGNOSIS — D571 Sickle-cell disease without crisis: Secondary | ICD-10-CM

## 2024-06-27 DIAGNOSIS — Z79891 Long term (current) use of opiate analgesic: Secondary | ICD-10-CM

## 2024-06-27 MED ORDER — OXYCODONE HCL 10 MG PO TABS: 10.0000 mg | ORAL_TABLET | Freq: Four times a day (QID) | ORAL | 0 refills | Status: DC | PRN

## 2024-06-27 NOTE — Telephone Encounter (Signed)
 No rush due before Lorice returns . Thank you. KH

## 2024-07-04 ENCOUNTER — Telehealth: Payer: Self-pay | Admitting: Nurse Practitioner

## 2024-07-04 NOTE — Telephone Encounter (Signed)
 Caller & Relationship to patient:  Self MRN #  [id number]   Call Back Number:   Date of Last Office Visit: 06/27/2024     Date of Next Office Visit: 08/28/2024    Medication(s) to be Refilled: Morphine  60mg   Preferred Pharmacy:   ** Please notify patient to allow 48-72 hours to process** **Let patient know to contact pharmacy at the end of the day to make sure medication is ready. ** **If patient has not been seen in a year or longer, book an appointment **Advise to use MyChart for refill requests OR to contact their pharmacy

## 2024-07-06 ENCOUNTER — Other Ambulatory Visit: Payer: Self-pay | Admitting: Nurse Practitioner

## 2024-07-06 DIAGNOSIS — Z79891 Long term (current) use of opiate analgesic: Secondary | ICD-10-CM

## 2024-07-06 DIAGNOSIS — D571 Sickle-cell disease without crisis: Secondary | ICD-10-CM

## 2024-07-06 MED ORDER — MORPHINE SULFATE ER 60 MG PO TBCR
60.0000 mg | EXTENDED_RELEASE_TABLET | Freq: Two times a day (BID) | ORAL | 0 refills | Status: DC
Start: 2024-07-08 — End: 2024-08-06

## 2024-07-06 NOTE — Progress Notes (Signed)
 Reviewed PDMP substance reporting system prior to prescribing opiate medications. No inconsistencies noted.   1. 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  Dispense: 60 tablet; Refill: 0  2. Chronic prescription opiate use  - morphine (MS CONTIN) 60 MG 12 hr tablet; Take 1 tablet (60 mg total) by mouth every 12 (twelve) hours.  Dispense: 60 tablet; Refill: 0

## 2024-07-16 ENCOUNTER — Other Ambulatory Visit: Payer: Self-pay | Admitting: Nurse Practitioner

## 2024-07-16 DIAGNOSIS — D571 Sickle-cell disease without crisis: Secondary | ICD-10-CM

## 2024-07-16 DIAGNOSIS — Z79891 Long term (current) use of opiate analgesic: Secondary | ICD-10-CM

## 2024-07-16 MED ORDER — OXYCODONE HCL 10 MG PO TABS
10.0000 mg | ORAL_TABLET | Freq: Four times a day (QID) | ORAL | 0 refills | Status: DC | PRN
Start: 2024-07-17 — End: 2024-07-30

## 2024-07-16 NOTE — Telephone Encounter (Signed)
 Reviewed PDMP substance reporting system prior to prescribing opiate medications. No inconsistencies noted.     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  Dispense: 60 tablet; Refill: 0  2. Chronic prescription opiate use  - Oxycodone HCl 10 MG TABS; Take 1 tablet (10 mg total) by mouth every 6 (six) hours as needed (pain).  Dispense: 60 tablet; Refill: 0

## 2024-07-16 NOTE — Telephone Encounter (Signed)
 Copied from CRM [id number]. Topic: Clinical - Medication Refill >> Jul 16, 2024  1:23 PM Shardie S wrote: Medication: Oxycodone  HCl 10 MG TABS  Has the patient contacted their pharmacy? No (Agent: If no, request that the patient contact the pharmacy for the refill. If patient does not wish to contact the pharmacy document the reason why and proceed with request.) (Agent: If yes, when and what did the pharmacy advise?)  This is the patient's preferred pharmacy:  [id number] - [place], [place] - [address] AT [facility] [address] [place] [address] Phone: [phone] Fax: [phone]    Is this the correct pharmacy for this prescription? Yes If no, delete pharmacy and type the correct one.   Has the prescription been filled recently? No  Is the patient out of the medication? Yes  Has the patient been seen for an appointment in the last year OR does the patient have an upcoming appointment? Yes  Can we respond through MyChart? Yes  Agent: Please be advised that Rx refills may take up to 3 business days. We ask that you follow-up with your pharmacy.

## 2024-07-20 ENCOUNTER — Ambulatory Visit (INDEPENDENT_AMBULATORY_CARE_PROVIDER_SITE_OTHER): Payer: MEDICAID | Admitting: Radiology

## 2024-07-20 ENCOUNTER — Encounter: Payer: Self-pay | Admitting: Radiology

## 2024-07-20 VITALS — BP 102/58 | HR 84 | Ht 63.5 in | Wt 163.0 lb

## 2024-07-20 DIAGNOSIS — Z113 Encounter for screening for infections with a predominantly sexual mode of transmission: Secondary | ICD-10-CM | POA: Diagnosis not present

## 2024-07-20 DIAGNOSIS — Z30018 Encounter for initial prescription of other contraceptives: Secondary | ICD-10-CM | POA: Diagnosis not present

## 2024-07-20 DIAGNOSIS — Z01419 Encounter for gynecological examination (general) (routine) without abnormal findings: Secondary | ICD-10-CM | POA: Diagnosis not present

## 2024-07-20 DIAGNOSIS — F329 Major depressive disorder, single episode, unspecified: Secondary | ICD-10-CM | POA: Diagnosis not present

## 2024-07-20 DIAGNOSIS — Z1331 Encounter for screening for depression: Secondary | ICD-10-CM | POA: Diagnosis not present

## 2024-07-20 MED ORDER — FLUOXETINE HCL 20 MG PO CAPS: 20.0000 mg | ORAL_CAPSULE | Freq: Every day | ORAL | 0 refills | Status: DC

## 2024-07-20 NOTE — Patient Instructions (Signed)
 Preventive Care 31-31 Years Old, Female Preventive care refers to lifestyle choices and visits with your health care provider that can promote health and wellness. Preventive care visits are also called wellness exams. What can I expect for my preventive care visit? Counseling During your preventive care visit, your health care provider may ask about your: Medical history, including: Past medical problems. Family medical history. Pregnancy history. Current health, including: Menstrual cycle. Method of birth control. Emotional well-being. Home life and relationship well-being. Sexual activity and sexual health. Lifestyle, including: Alcohol, nicotine or tobacco, and drug use. Access to firearms. Diet, exercise, and sleep habits. Work and work Astronomer. Sunscreen use. Safety issues such as seatbelt and bike helmet use. Physical exam Your health care provider may check your: Height and weight. These may be used to calculate your BMI (body mass index). BMI is a measurement that tells if you are at a healthy weight. Waist circumference. This measures the distance around your waistline. This measurement also tells if you are at a healthy weight and may help predict your risk of certain diseases, such as type 2 diabetes and high blood pressure. Heart rate and blood pressure. Body temperature. Skin for abnormal spots. What immunizations do I need?  Vaccines are usually given at various ages, according to a schedule. Your health care provider will recommend vaccines for you based on your age, medical history, and lifestyle or other factors, such as travel or where you work. What tests do I need? Screening Your health care provider may recommend screening tests for certain conditions. This may include: Pelvic exam and Pap test. Lipid and cholesterol levels. Diabetes screening. This is done by checking your blood sugar (glucose) after you have not eaten for a while (fasting). Hepatitis  B test. Hepatitis C test. HIV (human immunodeficiency virus) test. STI (sexually transmitted infection) testing, if you are at risk. BRCA-related cancer screening. This may be done if you have a family history of breast, ovarian, tubal, or peritoneal cancers. Talk with your health care provider about your test results, treatment options, and if necessary, the need for more tests. Follow these instructions at home: Eating and drinking  Eat a healthy diet that includes fresh fruits and vegetables, whole grains, lean protein, and low-fat dairy products. Take vitamin and mineral supplements as recommended by your health care provider. Do not drink alcohol if: Your health care provider tells you not to drink. You are pregnant, may be pregnant, or are planning to become pregnant. If you drink alcohol: Limit how much you have to 0-1 drink a day. Know how much alcohol is in your drink. In the [place] one drink equals one 12 oz bottle of beer (355 mL), one 5 oz glass of wine (148 mL), or one 1 oz glass of hard liquor (44 mL). Lifestyle Brush your teeth every morning and night with fluoride toothpaste. Floss one time each day. Exercise for at least 30 minutes 5 or more days each week. Do not use any products that contain nicotine or tobacco. These products include cigarettes, chewing tobacco, and vaping devices, such as e-cigarettes. If you need help quitting, ask your health care provider. Do not use drugs. If you are sexually active, practice safe sex. Use a condom or other form of protection to prevent STIs. If you do not wish to become pregnant, use a form of birth control. If you plan to become pregnant, see your health care provider for a prepregnancy visit. Find healthy ways to manage stress, such as: Meditation,  yoga, or listening to music. Journaling. Talking to a trusted person. Spending time with friends and family. Minimize exposure to UV radiation to reduce your risk of skin  cancer. Safety Always wear your seat belt while driving or riding in a vehicle. Do not drive: If you have been drinking alcohol. Do not ride with someone who has been drinking. If you have been using any mind-altering substances or drugs. While texting. When you are tired or distracted. Wear a helmet and other protective equipment during sports activities. If you have firearms in your house, make sure you follow all gun safety procedures. Seek help if you have been physically or sexually abused. What's next? Go to your health care provider once a year for an annual wellness visit. Ask your health care provider how often you should have your eyes and teeth checked. Stay up to date on all vaccines. This information is not intended to replace advice given to you by your health care provider. Make sure you discuss any questions you have with your health care provider. Document Revised: 04/15/2021 Document Reviewed: 04/15/2021 Elsevier Patient Education  2024 [employer].

## 2024-07-20 NOTE — Progress Notes (Signed)
 Carmen [name] 12/05/1990 [id number]   History:  33 y.o. G2P1 presents for annual exam as a new patient to discuss contraceptive options. Referred from [facility] cell center.  Gynecologic History Patient's last menstrual period was 07/11/2024 (exact date). Period Cycle (Days): 28 Period Duration (Days): 3-5 Period Pattern: Regular Menstrual Flow: Light, Heavy Menstrual Control: Thin pad, Maxi pad (adult diapers) Dysmenorrhea: (!) Mild Dysmenorrhea Symptoms: Cramping Contraception/Family planning: none Sexually active: yes Last Pap: 12/2022 normal  Obstetric History OB History  Gravida Para Term Preterm AB Living  2 1 1  1 1   SAB IAB Ectopic Multiple Live Births   1  0 1    # Outcome Date GA Lbr Len/2nd Weight Sex Type Anes PTL Lv  2 Term 02/05/19 [redacted]w[redacted]d  6 lb 0.3 oz (2.73 kg) M CS-For EPI, Other  LIV  1 IAB 05/2012               07/20/2024    2:00 PM 05/28/2024   10:20 AM 02/24/2024    9:25 AM  Depression screen PHQ 2/9  Decreased Interest 3 0 0  Down, Depressed, Hopeless 3 0 0  PHQ - 2 Score 6 0 0  Altered sleeping 3 0   Tired, decreased energy 3 2   Change in appetite 3 0   Feeling bad or failure about yourself  3 0   Trouble concentrating 3 0   Moving slowly or fidgety/restless 3 0   Suicidal thoughts 0 0   PHQ-9 Score 24 2   Difficult doing work/chores Extremely dIfficult Not difficult at all      The following portions of the patient's history were reviewed and updated as appropriate: allergies, current medications, past family history, past medical history, past social history, past surgical history, and problem list.  Review of Systems  All other systems reviewed and are negative.   Past medical history, past surgical history, family history and social history were all reviewed and documented in the EPIC chart.  Exam:  Vitals:   07/20/24 1358  BP: (!) 102/58  Pulse: 84  SpO2: 94%  Weight: 163 lb (73.9 kg)  Height: 5' 3.5 (1.613 m)   Body mass  index is 28.42 kg/m.  Physical Exam Vitals and nursing note reviewed. Exam conducted with a chaperone present.  Constitutional:      Appearance: Normal appearance. She is normal weight.  HENT:     Head: Normocephalic and atraumatic.  Neck:     Thyroid: No thyroid mass, thyromegaly or thyroid tenderness.  Cardiovascular:     Rate and Rhythm: Regular rhythm.     Heart sounds: Normal heart sounds.  Pulmonary:     Effort: Pulmonary effort is normal.     Breath sounds: Normal breath sounds.  Chest:  Breasts:    Breasts are symmetrical.     Right: Normal. No inverted nipple, mass, nipple discharge, skin change or tenderness.     Left: Normal. No inverted nipple, mass, nipple discharge, skin change or tenderness.  Abdominal:     General: Abdomen is flat. Bowel sounds are normal.     Palpations: Abdomen is soft.  Genitourinary:    General: Normal vulva.     Vagina: Normal. No vaginal discharge, bleeding or lesions.     Cervix: Normal. No discharge or lesion.     Uterus: Normal. Not enlarged and not tender.      Adnexa: Right adnexa normal and left adnexa normal.       Right: No  mass, tenderness or fullness.         Left: No mass, tenderness or fullness.    Lymphadenopathy:     Upper Body:     Right upper body: No axillary adenopathy.     Left upper body: No axillary adenopathy.  Skin:    General: Skin is warm and dry.  Neurological:     Mental Status: She is alert and oriented to person, place, and time.  Psychiatric:        Mood and Affect: Mood normal.        Thought Content: Thought content normal.        Judgment: Judgment normal.      Darice Hoit, CMA present for exam  Assessment/Plan:   1. Well woman exam with routine gynecological exam (Primary)  2. Depression screen  3. Screening for STDs (sexually transmitted diseases) - Hepatitis C antibody - HIV Antibody (routine testing w rflx) - RPR - SURESWAB CT/NG/T. vaginalis  4. Major depressive disorder with  current active episode, unspecified depression episode severity, unspecified whether recurrent Denies any thoughts of suicide or self harm. Did not do well on celexa , would like to try another medication while waiting for psych referral - Ambulatory referral to Psychiatry - FLUoxetine  (PROZAC ) 20 MG capsule; Take 1 capsule (20 mg total) by mouth daily.  Dispense: 90 capsule; Refill: 0  5. Encounter for initial prescription of other contraceptives Discussed all progestin only options, would like Nexplanon. Had unprotected sex 3 days ago, will schedule in 2 weeks. Use condoms consistently. - Insertion of implanon rod; Future   Return in about 1 year (around 07/20/2025) for Annual.  GINETTE COZIER B WHNP-BC 2:59 PM 07/20/2024

## 2024-07-21 LAB — SURESWAB CT/NG/T. VAGINALIS
C. trachomatis RNA, TMA: NOT DETECTED
N. gonorrhoeae RNA, TMA: NOT DETECTED
Trichomonas vaginalis RNA: NOT DETECTED

## 2024-07-23 ENCOUNTER — Ambulatory Visit: Payer: Self-pay | Admitting: Radiology

## 2024-07-23 LAB — HIV ANTIBODY (ROUTINE TESTING W REFLEX)
HIV 1&2 Ab, 4th Generation: NONREACTIVE
HIV FINAL INTERPRETATION: NEGATIVE

## 2024-07-23 LAB — RPR: RPR Ser Ql: NONREACTIVE

## 2024-07-23 LAB — HEPATITIS C ANTIBODY: Hepatitis C Ab: NONREACTIVE

## 2024-07-29 ENCOUNTER — Other Ambulatory Visit: Payer: Self-pay | Admitting: Nurse Practitioner

## 2024-07-29 DIAGNOSIS — M545 Low back pain, unspecified: Secondary | ICD-10-CM

## 2024-07-30 DIAGNOSIS — D571 Sickle-cell disease without crisis: Secondary | ICD-10-CM

## 2024-07-30 DIAGNOSIS — Z79891 Long term (current) use of opiate analgesic: Secondary | ICD-10-CM

## 2024-07-30 MED ORDER — OXYCODONE HCL 10 MG PO TABS: 10.0000 mg | ORAL_TABLET | Freq: Four times a day (QID) | ORAL | 0 refills | Status: DC | PRN

## 2024-07-30 NOTE — Telephone Encounter (Signed)
 Please advise [facility]

## 2024-07-30 NOTE — Telephone Encounter (Signed)
 LOV 05/28/24 Next OV 08/28/24 Last refill 07/17/24, #60, 0 refills  Please review, thanks!

## 2024-07-30 NOTE — Telephone Encounter (Signed)
 Reviewed PDMP substance reporting system prior to prescribing opiate medications. No inconsistencies noted.     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  Dispense: 60 tablet; Refill: 0  2. Chronic prescription opiate use  - Oxycodone HCl 10 MG TABS; Take 1 tablet (10 mg total) by mouth every 6 (six) hours as needed (pain).  Dispense: 60 tablet; Refill: 0

## 2024-07-31 ENCOUNTER — Emergency Department (HOSPITAL_COMMUNITY): Payer: MEDICAID

## 2024-07-31 ENCOUNTER — Inpatient Hospital Stay (HOSPITAL_COMMUNITY)
Admission: EM | Admit: 2024-07-31 | Discharge: 2024-08-03 | Disposition: A | Discharge status: Home / Self Care | Payer: MEDICAID | Attending: Internal Medicine | Admitting: Internal Medicine

## 2024-07-31 ENCOUNTER — Other Ambulatory Visit: Payer: Self-pay

## 2024-07-31 ENCOUNTER — Encounter (HOSPITAL_COMMUNITY): Payer: Self-pay | Admitting: Emergency Medicine

## 2024-07-31 DIAGNOSIS — D5700 Hb-SS disease with crisis, unspecified: Secondary | ICD-10-CM | POA: Diagnosis present

## 2024-07-31 DIAGNOSIS — Z832 Family history of diseases of the blood and blood-forming organs and certain disorders involving the immune mechanism: Secondary | ICD-10-CM

## 2024-07-31 DIAGNOSIS — F1120 Opioid dependence, uncomplicated: Secondary | ICD-10-CM | POA: Diagnosis present

## 2024-07-31 DIAGNOSIS — G894 Chronic pain syndrome: Secondary | ICD-10-CM | POA: Diagnosis present

## 2024-07-31 DIAGNOSIS — D72829 Elevated white blood cell count, unspecified: Secondary | ICD-10-CM | POA: Diagnosis present

## 2024-07-31 DIAGNOSIS — Z8419 Family history of other disorders of kidney and ureter: Secondary | ICD-10-CM

## 2024-07-31 DIAGNOSIS — Z79899 Other long term (current) drug therapy: Secondary | ICD-10-CM

## 2024-07-31 DIAGNOSIS — Z8249 Family history of ischemic heart disease and other diseases of the circulatory system: Secondary | ICD-10-CM

## 2024-07-31 DIAGNOSIS — F17200 Nicotine dependence, unspecified, uncomplicated: Secondary | ICD-10-CM | POA: Diagnosis present

## 2024-07-31 DIAGNOSIS — F329 Major depressive disorder, single episode, unspecified: Secondary | ICD-10-CM | POA: Diagnosis present

## 2024-07-31 DIAGNOSIS — Z888 Allergy status to other drugs, medicaments and biological substances status: Secondary | ICD-10-CM | POA: Diagnosis not present

## 2024-07-31 DIAGNOSIS — F419 Anxiety disorder, unspecified: Secondary | ICD-10-CM | POA: Diagnosis present

## 2024-07-31 DIAGNOSIS — D638 Anemia in other chronic diseases classified elsewhere: Secondary | ICD-10-CM | POA: Diagnosis present

## 2024-07-31 LAB — CBC WITH DIFFERENTIAL/PLATELET
Abs Immature Granulocytes: 0.04 K/uL (ref 0.00–0.07)
Basophils Absolute: 0.1 K/uL (ref 0.0–0.1)
Basophils Relative: 1 %
Eosinophils Absolute: 0.2 K/uL (ref 0.0–0.5)
Eosinophils Relative: 2 %
HCT: 28.9 % — ABNORMAL LOW (ref 36.0–46.0)
Hemoglobin: 10.1 g/dL — ABNORMAL LOW (ref 12.0–15.0)
Immature Granulocytes: 1 %
Lymphocytes Relative: 33 %
Lymphs Abs: 2.4 K/uL (ref 0.7–4.0)
MCH: 29.8 pg (ref 26.0–34.0)
MCHC: 34.9 g/dL (ref 30.0–36.0)
MCV: 85.3 fL (ref 80.0–100.0)
Monocytes Absolute: 0.9 K/uL (ref 0.1–1.0)
Monocytes Relative: 12 %
Neutro Abs: 3.9 K/uL (ref 1.7–7.7)
Neutrophils Relative %: 51 %
Platelets: 513 K/uL — ABNORMAL HIGH (ref 150–400)
RBC: 3.39 MIL/uL — ABNORMAL LOW (ref 3.87–5.11)
RDW: 14 % (ref 11.5–15.5)
WBC: 7.5 K/uL (ref 4.0–10.5)
nRBC: 0.3 % — ABNORMAL HIGH (ref 0.0–0.2)

## 2024-07-31 LAB — RETICULOCYTES
Immature Retic Fract: 34.4 % — ABNORMAL HIGH (ref 2.3–15.9)
RBC.: 3.37 MIL/uL — ABNORMAL LOW (ref 3.87–5.11)
Retic Count, Absolute: 169.2 K/uL (ref 19.0–186.0)
Retic Ct Pct: 5 % — ABNORMAL HIGH (ref 0.4–3.1)

## 2024-07-31 LAB — COMPREHENSIVE METABOLIC PANEL WITH GFR
ALT: 16 U/L (ref 0–44)
AST: 24 U/L (ref 15–41)
Albumin: 4.1 g/dL (ref 3.5–5.0)
Alkaline Phosphatase: 72 U/L (ref 38–126)
Anion gap: 11 (ref 5–15)
BUN: 9 mg/dL (ref 6–20)
CO2: 23 mmol/L (ref 22–32)
Calcium: 9.1 mg/dL (ref 8.9–10.3)
Chloride: 106 mmol/L (ref 98–111)
Creatinine, Ser: 0.53 mg/dL (ref 0.44–1.00)
GFR, Estimated: >60 mL/min (ref >60)
Glucose, Bld: 91 mg/dL (ref 70–99)
Potassium: 3.4 mmol/L — ABNORMAL LOW (ref 3.5–5.1)
Sodium: 139 mmol/L (ref 135–145)
Total Bilirubin: 1.2 mg/dL (ref 0.0–1.2)
Total Protein: 6.9 g/dL (ref 6.5–8.1)

## 2024-07-31 LAB — CBC
HCT: 27.5 % — ABNORMAL LOW (ref 36.0–46.0)
Hemoglobin: 9.5 g/dL — ABNORMAL LOW (ref 12.0–15.0)
MCH: 29.5 pg (ref 26.0–34.0)
MCHC: 34.5 g/dL (ref 30.0–36.0)
MCV: 85.4 fL (ref 80.0–100.0)
Platelets: 474 K/uL — ABNORMAL HIGH (ref 150–400)
RBC: 3.22 MIL/uL — ABNORMAL LOW (ref 3.87–5.11)
RDW: 14 % (ref 11.5–15.5)
WBC: 8.4 K/uL (ref 4.0–10.5)
nRBC: 0.2 % (ref 0.0–0.2)

## 2024-07-31 LAB — URINALYSIS, ROUTINE W REFLEX MICROSCOPIC
Bilirubin Urine: NEGATIVE
Glucose, UA: NEGATIVE mg/dL
Hgb urine dipstick: NEGATIVE
Ketones, ur: NEGATIVE mg/dL
Leukocytes,Ua: NEGATIVE
Nitrite: NEGATIVE
Protein, ur: NEGATIVE mg/dL
Specific Gravity, Urine: 1.012 (ref 1.005–1.030)
pH: 6 (ref 5.0–8.0)

## 2024-07-31 LAB — D-DIMER, QUANTITATIVE: D-Dimer, Quant: <0.27 ug{FEU}/mL (ref 0.00–0.50)

## 2024-07-31 LAB — CREATININE, SERUM
Creatinine, Ser: 0.5 mg/dL (ref 0.44–1.00)
GFR, Estimated: >60 mL/min (ref >60)

## 2024-07-31 LAB — HCG, SERUM, QUALITATIVE: Preg, Serum: NEGATIVE

## 2024-07-31 LAB — TROPONIN T, HIGH SENSITIVITY: Troponin T High Sensitivity: <15 ng/L (ref 0–19)

## 2024-07-31 MED ORDER — OXYCODONE HCL 5 MG PO TABS
10.0000 mg | ORAL_TABLET | Freq: Four times a day (QID) | ORAL | Status: DC | PRN
Start: 2024-08-01 — End: 2024-08-03
  Administered 2024-08-01 – 2024-08-03 (×4): 10 mg via ORAL
  Filled 2024-07-31 (×4): qty 2

## 2024-07-31 MED ORDER — PROCHLORPERAZINE EDISYLATE 10 MG/2ML IJ SOLN
5.0000 mg | Freq: Four times a day (QID) | INTRAMUSCULAR | Status: DC | PRN
  Administered 2024-07-31: 5 mg via INTRAVENOUS
  Filled 2024-07-31: qty 2

## 2024-07-31 MED ORDER — HYDROMORPHONE 1 MG/ML IV SOLN
INTRAVENOUS | Status: DC
  Administered 2024-07-31: 3.5 mg via INTRAVENOUS
  Administered 2024-07-31: 4 mg via INTRAVENOUS
  Administered 2024-07-31 – 2024-08-01 (×2): 30 mg via INTRAVENOUS
  Administered 2024-08-01: 3 mg via INTRAVENOUS
  Administered 2024-08-01: 5 mg via INTRAVENOUS
  Administered 2024-08-01: 3.5 mg via INTRAVENOUS
  Administered 2024-08-01: 2.5 mg via INTRAVENOUS
  Administered 2024-08-01: 2 mg via INTRAVENOUS
  Administered 2024-08-02: 30 mg via INTRAVENOUS
  Administered 2024-08-02: 1.5 mg via INTRAVENOUS
  Administered 2024-08-02: 5 mg via INTRAVENOUS
  Administered 2024-08-02: 6 mg via INTRAVENOUS
  Administered 2024-08-02: 2.5 mg via INTRAVENOUS
  Administered 2024-08-02 (×2): 4 mg via INTRAVENOUS
  Administered 2024-08-03: 5 mg via INTRAVENOUS
  Administered 2024-08-03: 8 mg via INTRAVENOUS
  Filled 2024-07-31 (×2): qty 30

## 2024-07-31 MED ORDER — FOLIC ACID 1 MG PO TABS
1.0000 mg | ORAL_TABLET | Freq: Every day | ORAL | Status: DC
  Administered 2024-08-01 – 2024-08-02 (×2): 1 mg via ORAL
  Filled 2024-07-31 (×2): qty 1

## 2024-07-31 MED ORDER — HYDROMORPHONE HCL 1 MG/ML IJ SOLN
1.5000 mg | INTRAMUSCULAR | Status: AC
  Administered 2024-07-31: 1.5 mg via INTRAVENOUS
  Filled 2024-07-31: qty 2

## 2024-07-31 MED ORDER — POLYETHYLENE GLYCOL 3350 17 G PO PACK: 17.0000 g | PACK | Freq: Every day | ORAL | Status: DC | PRN

## 2024-07-31 MED ORDER — HYDROMORPHONE HCL 1 MG/ML IJ SOLN: 1.5000 mg | INTRAMUSCULAR | Status: DC

## 2024-07-31 MED ORDER — ONDANSETRON HCL 4 MG/2ML IJ SOLN
4.0000 mg | INTRAMUSCULAR | Status: DC | PRN
  Administered 2024-07-31: 4 mg via INTRAVENOUS
  Filled 2024-07-31: qty 2

## 2024-07-31 MED ORDER — HYDROMORPHONE HCL 1 MG/ML IJ SOLN
2.0000 mg | INTRAMUSCULAR | Status: DC | PRN
  Administered 2024-07-31 (×2): 2 mg via INTRAVENOUS
  Filled 2024-07-31 (×2): qty 2

## 2024-07-31 MED ORDER — SENNOSIDES-DOCUSATE SODIUM 8.6-50 MG PO TABS
1.0000 | ORAL_TABLET | Freq: Two times a day (BID) | ORAL | Status: DC
  Administered 2024-07-31 – 2024-08-02 (×6): 1 via ORAL
  Filled 2024-07-31 (×6): qty 1

## 2024-07-31 MED ORDER — DIPHENHYDRAMINE HCL 25 MG PO CAPS: 25.0000 mg | ORAL_CAPSULE | ORAL | Status: DC | PRN

## 2024-07-31 MED ORDER — KETOROLAC TROMETHAMINE 15 MG/ML IJ SOLN
15.0000 mg | Freq: Four times a day (QID) | INTRAMUSCULAR | Status: DC
  Administered 2024-07-31 – 2024-08-02 (×9): 15 mg via INTRAVENOUS
  Filled 2024-07-31 (×9): qty 1

## 2024-07-31 MED ORDER — FLUOXETINE HCL 20 MG PO CAPS
20.0000 mg | ORAL_CAPSULE | Freq: Every day | ORAL | Status: DC
  Administered 2024-08-01 – 2024-08-02 (×2): 20 mg via ORAL
  Filled 2024-07-31 (×2): qty 1

## 2024-07-31 MED ORDER — ENOXAPARIN SODIUM 40 MG/0.4ML IJ SOSY
40.0000 mg | PREFILLED_SYRINGE | INTRAMUSCULAR | Status: DC
  Filled 2024-07-31 (×2): qty 0.4

## 2024-07-31 MED ORDER — NALOXONE HCL 0.4 MG/ML IJ SOLN: 0.4000 mg | INTRAMUSCULAR | Status: DC | PRN

## 2024-07-31 MED ORDER — MORPHINE SULFATE ER 30 MG PO TBCR
60.0000 mg | EXTENDED_RELEASE_TABLET | Freq: Two times a day (BID) | ORAL | Status: DC
  Administered 2024-07-31 – 2024-08-02 (×5): 60 mg via ORAL
  Filled 2024-07-31: qty 2
  Filled 2024-07-31 (×2): qty 4
  Filled 2024-07-31 (×2): qty 2

## 2024-07-31 MED ORDER — SODIUM CHLORIDE 0.45 % IV SOLN: INTRAVENOUS | Status: AC

## 2024-07-31 MED ORDER — SODIUM CHLORIDE 0.9 % IV SOLN
12.5000 mg | Freq: Once | INTRAVENOUS | Status: AC
  Administered 2024-07-31: 12.5 mg via INTRAVENOUS
  Filled 2024-07-31: qty 12.5

## 2024-07-31 MED ORDER — CYCLOBENZAPRINE HCL 10 MG PO TABS
10.0000 mg | ORAL_TABLET | Freq: Every day | ORAL | Status: DC
  Administered 2024-07-31 – 2024-08-02 (×3): 10 mg via ORAL
  Filled 2024-07-31 (×3): qty 1

## 2024-07-31 MED ORDER — SODIUM CHLORIDE 0.45 % IV SOLN: INTRAVENOUS | Status: DC

## 2024-07-31 MED ORDER — KETOROLAC TROMETHAMINE 15 MG/ML IJ SOLN
15.0000 mg | Freq: Once | INTRAMUSCULAR | Status: AC
  Administered 2024-07-31: 15 mg via INTRAVENOUS
  Filled 2024-07-31: qty 1

## 2024-07-31 MED ORDER — SODIUM CHLORIDE 0.9% FLUSH: 9.0000 mL | INTRAVENOUS | Status: DC | PRN

## 2024-07-31 MED ORDER — HYDROMORPHONE HCL 1 MG/ML IJ SOLN
2.0000 mg | INTRAMUSCULAR | Status: AC
  Administered 2024-07-31: 2 mg via INTRAVENOUS
  Filled 2024-07-31: qty 2

## 2024-07-31 NOTE — ED Provider Notes (Signed)
 [place] EMERGENCY DEPARTMENT AT [facility] Provider Note   CSN: [id number] Arrival date & time: 07/31/24  9278     Patient presents with: Sickle Cell Pain Crisis   Carmen [name] is a 33 y.o. female with history of sickle cell presents to the emerged from today for evaluation of sickle cell pain for 2 days.  Patient reports this feels like her typical sickle cell pain.  She feels pain in her diffuse back and feels some central chest pain.  Reports she does have some pain more with taking deep breaths.  No cough, runny nose, nasal congestion, or fever.  She denies any abdominal pain, nausea, vomiting, changes to her bowel or bladder habits.  She has tried her morphine  and narcotics at home without relief of her symptoms and presented to the ER.  She has had a history of acute chest but reports it was in childhood.  She denies any history of DVT or PE.  Not on any anticoagulation.  Denies any leg swelling or shortness of breath.  Sickle Cell Pain Crisis Associated symptoms: chest pain   Associated symptoms: no congestion, no cough, no fever, no nausea, no shortness of breath and no vomiting        Prior to Admission medications   Medication Sig Start Date End Date Taking? Authorizing Provider  BENADRYL  ALLERGY 25 MG capsule Take 25 mg by mouth every 6 (six) hours as needed for allergies.    [provider]  cyclobenzaprine  (FLEXERIL ) 10 MG tablet TAKE 1 TABLET BY MOUTH DAILY AT BEDTIME AS NEEDED FOR MUSCLE SPASMS 07/30/24   Paseda, Folashade R, FNP  FLUoxetine  (PROZAC ) 20 MG capsule Take 1 capsule (20 mg total) by mouth daily. 07/20/24   Chrzanowski, Jami B, NP  folic acid  (FOLVITE ) 1 MG tablet Take 1 tablet (1 mg total) by mouth daily. 12/27/23   Paseda, Folashade R, FNP  morphine  (MS CONTIN ) 60 MG 12 hr tablet Take 1 tablet (60 mg total) by mouth every 12 (twelve) hours. 07/08/24   Paseda, Folashade R, FNP  naloxone  (NARCAN ) nasal spray 4 mg/0.1 mL 1 nasal spray in one  nostril as needed for opoid overdose 05/28/24   Paseda, Folashade R, FNP  Oxycodone  HCl 10 MG TABS Take 1 tablet (10 mg total) by mouth every 6 (six) hours as needed (pain). 08/01/24   Paseda, Folashade R, FNP  Vitamin D , Ergocalciferol , (DRISDOL ) 1.25 MG (50000 UNIT) CAPS capsule TAKE 1 CAPSULE BY MOUTH ONE TIME PER WEEK 12/27/23   Paseda, Folashade R, FNP    Allergies: Ketamine     Review of Systems  Constitutional:  Negative for chills and fever.  HENT:  Negative for congestion and rhinorrhea.   Respiratory:  Negative for cough and shortness of breath.   Cardiovascular:  Positive for chest pain. Negative for palpitations and leg swelling.  Gastrointestinal:  Negative for abdominal pain, constipation, diarrhea, nausea and vomiting.  Genitourinary:  Negative for dysuria and hematuria.  Musculoskeletal:  Positive for back pain.    Updated Vital Signs BP 118/61   Pulse (!) 55   Temp 98.6 F (37 C) (Oral)   Resp 16   LMP 07/11/2024 (Exact Date)   SpO2 100%   Physical Exam Vitals and nursing note reviewed.  Constitutional:      Appearance: She is not toxic-appearing.     Comments: Uncomfortable, but not in any acute distress.  On phone.  Eyes:     General: No scleral icterus. Cardiovascular:  Rate and Rhythm: Normal rate.     Pulses:          Radial pulses are 2+ on the right side and 2+ on the left side.       Dorsalis pedis pulses are 2+ on the right side and 2+ on the left side.       Posterior tibial pulses are 2+ on the right side and 2+ on the left side.  Pulmonary:     Effort: Pulmonary effort is normal. No respiratory distress.     Breath sounds: Normal breath sounds.  Chest:     Chest wall: No tenderness.  Abdominal:     Palpations: Abdomen is soft.     Tenderness: There is no abdominal tenderness. There is no guarding or rebound.  Musculoskeletal:     Cervical back: Normal range of motion.     Comments: Diffuse back tenderness palpation.  No civic midline  tenderness.  Mainly tenderness to her bilateral upper trapezius.  Skin:    General: Skin is warm and dry.  Neurological:     Mental Status: She is alert.     (all labs ordered are listed, but only abnormal results are displayed) Labs Reviewed  COMPREHENSIVE METABOLIC PANEL WITH GFR - Abnormal; Notable for the following components:      Result Value   Potassium 3.4 (*)    All other components within normal limits  CBC WITH DIFFERENTIAL/PLATELET - Abnormal; Notable for the following components:   RBC 3.39 (*)    Hemoglobin 10.1 (*)    HCT 28.9 (*)    Platelets 513 (*)    nRBC 0.3 (*)    All other components within normal limits  RETICULOCYTES - Abnormal; Notable for the following components:   Retic Ct Pct 5.0 (*)    RBC. 3.37 (*)    Immature Retic Fract 34.4 (*)    All other components within normal limits  CBC - Abnormal; Notable for the following components:   RBC 3.22 (*)    Hemoglobin 9.5 (*)    HCT 27.5 (*)    Platelets 474 (*)    All other components within normal limits  URINALYSIS, ROUTINE W REFLEX MICROSCOPIC  HCG, SERUM, QUALITATIVE  D-DIMER, QUANTITATIVE  CREATININE, SERUM  TROPONIN T, HIGH SENSITIVITY    EKG: None  Radiology: DG Chest 2 View Result Date: 07/31/2024 CLINICAL DATA:  Chest pain EXAM: CHEST - 2 VIEW COMPARISON:  Chest x-ray performed January 29, 2024 FINDINGS: No discrete lobar infiltrate. No pleural effusion or pneumothorax. Heart mediastinum are within normal limits. IMPRESSION: No active cardiopulmonary disease. Electronically Signed   By: Maude Naegeli M.D.   On: 07/31/2024 08:41   Procedures   Medications Ordered in the ED  ondansetron  (ZOFRAN ) injection 4 mg (4 mg Intravenous Given 07/31/24 0855)  0.45 % sodium chloride  infusion ( Intravenous New Bag/Given 07/31/24 0851)  senna-docusate (Senokot-S) tablet 1 tablet (1 tablet Oral Given 07/31/24 1357)  polyethylene glycol (MIRALAX  / GLYCOLAX ) packet 17 g (has no administration in time range)   enoxaparin  (LOVENOX ) injection 40 mg (has no administration in time range)  0.45 % sodium chloride  infusion (has no administration in time range)  ketorolac  (TORADOL ) 15 MG/ML injection 15 mg (has no administration in time range)  naloxone  (NARCAN ) injection 0.4 mg (has no administration in time range)    And  sodium chloride  flush (NS) 0.9 % injection 9 mL (has no administration in time range)  diphenhydrAMINE  (BENADRYL ) capsule 25 mg (has no administration  in time range)  HYDROmorphone  (DILAUDID ) 1 mg/mL PCA injection (has no administration in time range)  HYDROmorphone  (DILAUDID ) injection 2 mg (2 mg Intravenous Given 07/31/24 1358)  HYDROmorphone  (DILAUDID ) injection 1.5 mg (1.5 mg Intravenous Given 07/31/24 0855)  HYDROmorphone  (DILAUDID ) injection 1.5 mg (1.5 mg Intravenous Given 07/31/24 0953)  diphenhydrAMINE  (BENADRYL ) 12.5 mg in sodium chloride  0.9 % 50 mL IVPB (0 mg Intravenous Stopped 07/31/24 1038)  HYDROmorphone  (DILAUDID ) injection 2 mg (2 mg Intravenous Given 07/31/24 1111)  ketorolac  (TORADOL ) 15 MG/ML injection 15 mg (15 mg Intravenous Given 07/31/24 1110)   Medical Decision Making Amount and/or Complexity of Data Reviewed Labs: ordered. Radiology: ordered.  Risk Prescription drug management. Decision regarding hospitalization.   33 y.o. female presents to the ER for evaluation of sickle cell pain with diffuse back pain and some chest pain. Differential diagnosis includes but is not limited to ACS, pericarditis, myocarditis, aortic dissection, PE, pneumothorax, esophageal rupture, pneumonia, reflux/PUD, biliary disease, pancreatitis, costochondritis, anxiety, sickle cell pain, MSK. Vital signs unremarkable. Physical exam as noted above.   Chest is nontender palpation however patient reports to me pains in her diffuse upper and lower back.  She is not having other palpitations or leg swelling.  She has never had a PE or DVT before.  Will order D-dimer and rest of cardiac  workup with sickle cell workup.  I independently reviewed and interpreted the patient's labs.  Troponin less than 15, D-dimer undetectable, reticulocyte counts at patient's baseline.  Urinalysis unremarkable.  CBC shows mild anemia with hemoglobin 10.1, appears around patient's baseline.  Does have some slightly elevated platelet count which again appears from her baseline.  No leukocytosis.  hCG is negative.  CMP shows mildly decreased potassium 3 and 4, otherwise no electrolyte or LFT abnormality.  CXR shows  No active cardiopulmonary disease. Per radiologist's interpretation.    EKG reviewed and shows NSR.   Received multiple rounds of Dilaudid , Toradol , fluids, and antiemetics with Benadryl .  Still experiencing pain.  Cardiac workup is unremarkable.  Less likely PE given acceptable D-dimer.  Troponin less than 15.  Do not need any second opponent given her duration of pain.  X-ray does not show any acute cardiopulmonary process.  Patient's lung sounds are clear to auscultation.  Will admit to sickle cell team.  Portions of this report may have been transcribed using voice recognition software. Every effort was made to ensure accuracy; however, inadvertent computerized transcription errors may be present.    Final diagnoses:  Sickle cell anemia with pain [facility])    ED Discharge Orders     None          Bernis Ernst, PA-C 07/31/24 1431    Pamella Sharper A, DO 08/03/24 2354

## 2024-07-31 NOTE — H&P (Incomplete)
 H&P  Patient Demographics:  Carmen [name], is a 33 y.o. female  MRN: [id number]   DOB - October 27, 1991  Admit Date - 07/31/2024  Outpatient Primary MD for the patient is Paseda, Folashade R, FNP  Chief Complaint  Patient presents with   Sickle Cell Pain Crisis      HPI:   Carmen [name]  is a 33 y.o. female with medical history significant for sickle cell disease, chronic pain syndrome, opiate dependence and tolerance, anemia of chronic disease and major depressive disorder, who presented to the emergency department with worsening mid back and hip pain consistent with sickle cell pain in the past. Patient attributes stress as a precipitating factor. Denies fever, cough and headache. No sick contacts or recent travels.   ED Course: BP 118/61  Pulse (!) 55  Temp 98.6 F (37 C) (Oral)  Resp 16  LMP 07/11/2024 (Exact Date)  SpO2 100%  Hgb 9.5, HCT 27.5, WBC 8.4, Platelets 474, RBC 3.37, reticulocytes 5.0, Patient was treated in the emergency department with IV fluid, IV pain medication with other adjunct treatments without resolution.  Patient was therefore admitted inpatient for ongoing sickle cell pain management.    Review of systems:  In addition to the HPI above, patient reports No fever or chills No Headache, No changes with vision or hearing No problems swallowing food or liquids No chest pain, cough or shortness of breath No abdominal pain, No nausea or vomiting, Bowel movements are regular No blood in stool or urine No dysuria No new skin rashes or bruises No new joints pains-aches No new weakness, tingling, numbness in any extremity No recent weight gain or loss No polyuria, polydypsia or polyphagia No significant Mental Stressors  A full 10 point Review of Systems was done, except as stated above, all other Review of Systems were negative.  With Past History of the following :   Past Medical History:  Diagnosis Date   Anxiety    Headache(784.0)    Heart murmur     Sickle cell crisis (HCC)    Syphilis 2015   Was diagnosed and received one injection of antibiotics   Thrombocytosis 11/22/2014     CBC Latest Ref Rng & Units 12/13/2018 12/11/2018 12/10/2018 WBC 4.0 - 10.5 K/uL 14.8(H) 13.2(H) 15.9(H) Hemoglobin 12.0 - 15.0 g/dL 8.2(L) 7.7(L) 8.1(L) Hematocrit 36.0 - 46.0 % 23.7(L) 23.2(L) 24.5(L) Platelets 150 - 400 K/uL 326 399 388        Past Surgical History:  Procedure Laterality Date   CESAREAN SECTION N/A 02/05/2019   Procedure: CESAREAN SECTION;  Surgeon: Lorence Ozell CROME, MD;  Location: [facility] LD ORS;  Service: Obstetrics;  Laterality: N/A;   CHOLECYSTECTOMY N/A 11/30/2014   Procedure: LAPAROSCOPIC CHOLECYSTECTOMY SINGLE SITE WITH INTRAOPERATIVE CHOLANGIOGRAM;  Surgeon: Elspeth Schultze, MD;  Location: [facility] ORS;  Service: General;  Laterality: N/A;   SPLENECTOMY       Social History:   Social History   Tobacco Use   Smoking status: Some Days    Types: Cigarettes   Smokeless tobacco: Never  Substance Use Topics   Alcohol use: No     Lives - At home   Family History :   Family History  Problem Relation Age of Onset   Hypertension Mother    Sickle cell anemia Sister    Kidney disease Sister        Lupus   Arthritis Sister    Sickle cell anemia Sister    Sickle cell trait Sister    Heart  disease Maternal Aunt        CABG   Heart disease Maternal Uncle        CABG   Lupus Sister      Home Medications:   Prior to Admission medications   Medication Sig Start Date End Date Taking? Authorizing Provider  BENADRYL  ALLERGY 25 MG capsule Take 25 mg by mouth every 6 (six) hours as needed for allergies.    [provider]  cyclobenzaprine  (FLEXERIL ) 10 MG tablet TAKE 1 TABLET BY MOUTH DAILY AT BEDTIME AS NEEDED FOR MUSCLE SPASMS 07/30/24   Paseda, Folashade R, FNP  FLUoxetine  (PROZAC ) 20 MG capsule Take 1 capsule (20 mg total) by mouth daily. 07/20/24   Chrzanowski, Jami B, NP  folic acid  (FOLVITE ) 1 MG tablet Take 1 tablet (1 mg total) by  mouth daily. 12/27/23   Paseda, Folashade R, FNP  morphine  (MS CONTIN ) 60 MG 12 hr tablet Take 1 tablet (60 mg total) by mouth every 12 (twelve) hours. 07/08/24   Paseda, Folashade R, FNP  naloxone  (NARCAN ) nasal spray 4 mg/0.1 mL 1 nasal spray in one nostril as needed for opoid overdose 05/28/24   Paseda, Folashade R, FNP  Oxycodone  HCl 10 MG TABS Take 1 tablet (10 mg total) by mouth every 6 (six) hours as needed (pain). 08/01/24   Paseda, Folashade R, FNP  Vitamin D , Ergocalciferol , (DRISDOL ) 1.25 MG (50000 UNIT) CAPS capsule TAKE 1 CAPSULE BY MOUTH ONE TIME PER WEEK 12/27/23   Paseda, Folashade R, FNP     Allergies:   Allergies  Allergen Reactions   Ketamine  Hives, Nausea And Vomiting and Other (See Comments)    Makes the patient feel funny also     Physical Exam:   Vitals:   Vitals:   08/01/24 1011 08/01/24 1122  BP: 131/77   Pulse: (!) 59   Resp: 14 16  Temp: 98.6 F (37 C)   SpO2: 99%     Physical Exam: Constitutional: Patient appears well-developed and well-nourished. Not in obvious distress. HENT: Normocephalic, atraumatic, External right and left ear normal. Oropharynx is clear and moist.  Eyes: Conjunctivae and EOM are normal. PERRLA, no scleral icterus. Neck: Normal ROM. Neck supple. No JVD. No tracheal deviation. No thyromegaly. CVS: RRR, S1/S2 +, no murmurs, no gallops, no carotid bruit.  Pulmonary: Effort and breath sounds normal, no stridor, rhonchi, wheezes, rales.  Abdominal: Soft. BS +, no distension, tenderness, rebound or guarding.  Musculoskeletal: Generalized body tenderness Lymphadenopathy: No lymphadenopathy noted, cervical, inguinal or axillary Neuro: Alert. Normal reflexes, muscle tone coordination. No cranial nerve deficit. Skin: Skin is warm and dry. No rash noted. Not diaphoretic. No erythema. No pallor. Psychiatric: Normal mood and affect. Behavior, judgment, thought content normal.   Data Review:   CBC Recent Labs  Lab 07/31/24 0810  07/31/24 1247 08/01/24 1016  WBC 7.5 8.4 7.8  HGB 10.1* 9.5* 10.5*  HCT 28.9* 27.5* 30.3*  PLT 513* 474* 462*  MCV 85.3 85.4 85.4  MCH 29.8 29.5 29.6  MCHC 34.9 34.5 34.7  RDW 14.0 14.0 13.9  LYMPHSABS 2.4  --   --   MONOABS 0.9  --   --   EOSABS 0.2  --   --   BASOSABS 0.1  --   --    ------------------------------------------------------------------------------------------------------------------  Chemistries  Recent Labs  Lab 07/31/24 0810 07/31/24 1247  NA 139  --   K 3.4*  --   CL 106  --   CO2 23  --  GLUCOSE 91  --   BUN 9  --   CREATININE 0.53 0.50  CALCIUM  9.1  --   AST 24  --   ALT 16  --   ALKPHOS 72  --   BILITOT 1.2  --    ------------------------------------------------------------------------------------------------------------------ estimated creatinine clearance is 98.8 mL/min (by C-G formula based on SCr of 0.5 mg/dL). ------------------------------------------------------------------------------------------------------------------ No results for input(s): TSH, T4TOTAL, T3FREE, THYROIDAB in the last 72 hours.  Invalid input(s): FREET3  Coagulation profile No results for input(s): INR, PROTIME in the last 168 hours. ------------------------------------------------------------------------------------------------------------------- Recent Labs    07/31/24 0810  DDIMER <0.27   -------------------------------------------------------------------------------------------------------------------  Cardiac Enzymes No results for input(s): CKMB, TROPONINI, MYOGLOBIN in the last 168 hours.  Invalid input(s): CK ------------------------------------------------------------------------------------------------------------------ No results found for: BNP  ---------------------------------------------------------------------------------------------------------------  Urinalysis    Component Value Date/Time   COLORURINE YELLOW  07/31/2024 1039   APPEARANCEUR CLEAR 07/31/2024 1039   LABSPEC 1.012 07/31/2024 1039   PHURINE 6.0 07/31/2024 1039   GLUCOSEU NEGATIVE 07/31/2024 1039   HGBUR NEGATIVE 07/31/2024 1039   BILIRUBINUR NEGATIVE 07/31/2024 1039   BILIRUBINUR negative 08/26/2021 1527   BILIRUBINUR Negative 08/10/2019 1534   KETONESUR NEGATIVE 07/31/2024 1039   PROTEINUR NEGATIVE 07/31/2024 1039   UROBILINOGEN 1.0 08/26/2021 1527   UROBILINOGEN 1.0 01/12/2018 0943   NITRITE NEGATIVE 07/31/2024 1039   LEUKOCYTESUR NEGATIVE 07/31/2024 1039    ----------------------------------------------------------------------------------------------------------------   Imaging Results:    DG Chest 2 View Result Date: 07/31/2024 CLINICAL DATA:  Chest pain EXAM: CHEST - 2 VIEW COMPARISON:  Chest x-ray performed January 29, 2024 FINDINGS: No discrete lobar infiltrate. No pleural effusion or pneumothorax. Heart mediastinum are within normal limits. IMPRESSION: No active cardiopulmonary disease. Electronically Signed   By: Maude Naegeli M.D.   On: 07/31/2024 08:41     Assessment & Plan:  Principal Problem:   Sickle cell anemia with pain (HCC)   Hb Sickle Cell Disease with crisis: Admit patient, start IVF 0.45% Saline @ 125 mls/hour, start weight based Dilaudid  PCA, start IV Toradol  15 mg Q 6 H, Restart oral home pain medications, Monitor vitals very closely, Re-evaluate pain scale regularly, 2 L of Oxygen  by , Patient will be re-evaluated for pain in the context of function and relationship to baseline as care progresses. Leukocytosis: Stable  Anemia of Chronic Disease: Within patient's baseline. Chronic pain Syndrome: Continue oral home pain medication.   DVT Prophylaxis: Subcut Lovenox    AM Labs Ordered, also please review Full Orders  Family Communication: Admission, patient's condition and plan of care including tests being ordered have been discussed with the patient who indicate understanding and agree with the plan  and Code Status.  Code Status: Full Code  Consults called: None    Admission status: Inpatient    Time spent in minutes : 50 minutes  [name] Cover NP 08/01/2024 at 2:24 PM

## 2024-07-31 NOTE — ED Notes (Signed)
 Pt made aware she needs to provide urine sample for urinalysis. Pt advised she cannot go at this time.

## 2024-07-31 NOTE — ED Triage Notes (Signed)
 Pt reports back pain and chest pain. Denies SHOB. Pt reports this feels like her sickle cell pain.

## 2024-07-31 NOTE — Progress Notes (Signed)
SBAR reviewed

## 2024-07-31 NOTE — ED Notes (Signed)
 Writer contacted lab and added on reticulocytes

## 2024-07-31 NOTE — ED Notes (Signed)
Informed pt of the need for urine. 

## 2024-08-01 LAB — CBC
HCT: 30.3 % — ABNORMAL LOW (ref 36.0–46.0)
Hemoglobin: 10.5 g/dL — ABNORMAL LOW (ref 12.0–15.0)
MCH: 29.6 pg (ref 26.0–34.0)
MCHC: 34.7 g/dL (ref 30.0–36.0)
MCV: 85.4 fL (ref 80.0–100.0)
Platelets: 462 K/uL — ABNORMAL HIGH (ref 150–400)
RBC: 3.55 MIL/uL — ABNORMAL LOW (ref 3.87–5.11)
RDW: 13.9 % (ref 11.5–15.5)
WBC: 7.8 K/uL (ref 4.0–10.5)
nRBC: 0.4 % — ABNORMAL HIGH (ref 0.0–0.2)

## 2024-08-01 NOTE — Plan of Care (Signed)

## 2024-08-01 NOTE — Progress Notes (Cosign Needed Addendum)
 Patient ID: Carmen [name], female   DOB: 1991-09-15, 32 y.o.   MRN: [id number] Subjective: Carmen [name]  is a 33 y.o. female with medical history significant for sickle cell disease, chronic pain syndrome, opiate dependence and tolerance, anemia of chronic disease and major depressive disorder, who presented to the emergency department with generalized body tenderness, worsening mid back and hip pain consistent with sickle cell pain in the past. Patient attributes stress as a precipitating factor.   Patient is reporting pain of 8/10 morning.  She denies subjective fever, headache, nausea or vomiting, Cough/ headache. No urinary symptoms, sick contacts or recent travels.   Objective:  Vital signs in last 24 hours:  Vitals:   08/02/24 0807 08/02/24 1053 08/02/24 1218 08/02/24 1347  BP:  108/71  114/81  Pulse:  (!) 59  65  Resp: 15 12 14 18   Temp:  99.8 F (37.7 C)  98.7 F (37.1 C)  TempSrc:  Oral  Oral  SpO2:  100%  100%  Weight:      Height:        Intake/Output from previous day:   Intake/Output Summary (Last 24 hours) at 08/02/2024 1354 Last data filed at 08/02/2024 0015 Gross per 24 hour  Intake 397 ml  Output --  Net 397 ml    Physical Exam: General: Alert, awake, oriented x3, in no acute distress.  HEENT: [place]/AT PEERL, EOMI Neck: Trachea midline,  no masses, no thyromegal,y no JVD, no carotid bruit OROPHARYNX:  Moist, No exudate/ erythema/lesions.  Heart: Regular rate and rhythm, without murmurs, rubs, gallops, PMI non-displaced, no heaves or thrills on palpation.  Lungs: Clear to auscultation, no wheezing or rhonchi noted. No increased vocal fremitus resonant to percussion  Abdomen: Soft, nontender, nondistended, positive bowel sounds, no masses no hepatosplenomegaly noted..  Neuro: No focal neurological deficits noted cranial nerves II through XII grossly intact. DTRs 2+ bilaterally upper and lower extremities. Strength 5 out of 5 in bilateral upper and lower  extremities. Musculoskeletal: Generalized body tenderness and bilateral hip  psychiatric: Patient alert and oriented x3, good insight and cognition, good recent to remote recall. Lymph node survey: No cervical axillary or inguinal lymphadenopathy noted.  Lab Results:  Basic Metabolic Panel:    Component Value Date/Time   NA 139 07/31/2024 0810   NA 140 02/24/2024 0947   K 3.4 (L) 07/31/2024 0810   CL 106 07/31/2024 0810   CO2 23 07/31/2024 0810   BUN 9 07/31/2024 0810   BUN 8 02/24/2024 0947   CREATININE 0.50 07/31/2024 1247   GLUCOSE 91 07/31/2024 0810   CALCIUM  9.1 07/31/2024 0810   CBC:    Component Value Date/Time   WBC 8.9 08/02/2024 0836   HGB 10.3 (L) 08/02/2024 0836   HGB 10.4 (L) 02/24/2024 0947   HCT 28.9 (L) 08/02/2024 0836   HCT 32.7 (L) 02/24/2024 0947   PLT 433 (H) 08/02/2024 0836   PLT 583 (H) 02/24/2024 0947   MCV 85.5 08/02/2024 0836   MCV 92 02/24/2024 0947   NEUTROABS 3.9 07/31/2024 0810   NEUTROABS 5.4 02/24/2024 0947   LYMPHSABS 2.4 07/31/2024 0810   LYMPHSABS 2.9 02/24/2024 0947   MONOABS 0.9 07/31/2024 0810   EOSABS 0.2 07/31/2024 0810   EOSABS 0.2 02/24/2024 0947   BASOSABS 0.1 07/31/2024 0810   BASOSABS 0.1 02/24/2024 0947    No results found for this or any previous visit (from the past 240 hours).  Studies/Results: No results found.   Medications: Scheduled Meds:  cyclobenzaprine   10 mg Oral QHS   enoxaparin  (LOVENOX ) injection  40 mg Subcutaneous Q24H   feeding supplement  237 mL Oral BID BM   FLUoxetine   20 mg Oral Daily   folic acid   1 mg Oral Daily   HYDROmorphone    Intravenous Q4H   ketorolac   15 mg Intravenous Q6H   morphine   60 mg Oral Q12H   senna-docusate  1 tablet Oral BID   Continuous Infusions: PRN Meds:.HYDROmorphone  (DILAUDID ) injection, naloxone  **AND** sodium chloride  flush, oxyCODONE , polyethylene glycol,  prochlorperazine   Consultants: None  Procedures: None  Antibiotics: None  Assessment/Plan: Principal Problem:   Sickle cell anemia with pain (HCC) Active Problems:   Anemia of chronic disease   Tobacco use disorder   Hb Sickle Cell Disease with Pain crisis: Patient's pain is gradually improving, IVF 0.45% Saline @ KVO, continue weight based Dilaudid  PCA, IV Toradol  15 mg Q 6 H for a total of 5 days, continue oral home pain medications as ordered. Monitor vitals very closely, Re-evaluate pain scale regularly, 2 L of Oxygen  by [place]. Patient encouraged to ambulate on the hallway today.  Anemia of Chronic Disease: Hemoglobin at baseline no need for transfusion at this time will continue to monitor daily CBC Chronic pain Syndrome: Continue oral home pain medication.   Code Status: Full Code Family Communication: N/A Disposition Plan: Not yet ready for discharge  [name] Cover NP  If 7PM-7AM, please contact night-coverage.  08/02/2024, 1:54 PM  LOS: 2 days

## 2024-08-02 ENCOUNTER — Other Ambulatory Visit: Payer: Self-pay

## 2024-08-02 ENCOUNTER — Telehealth: Payer: Self-pay

## 2024-08-02 DIAGNOSIS — Z79891 Long term (current) use of opiate analgesic: Secondary | ICD-10-CM

## 2024-08-02 DIAGNOSIS — D571 Sickle-cell disease without crisis: Secondary | ICD-10-CM

## 2024-08-02 LAB — CBC
HCT: 28.9 % — ABNORMAL LOW (ref 36.0–46.0)
Hemoglobin: 10.3 g/dL — ABNORMAL LOW (ref 12.0–15.0)
MCH: 30.5 pg (ref 26.0–34.0)
MCHC: 35.6 g/dL (ref 30.0–36.0)
MCV: 85.5 fL (ref 80.0–100.0)
Platelets: 433 K/uL — ABNORMAL HIGH (ref 150–400)
RBC: 3.38 MIL/uL — ABNORMAL LOW (ref 3.87–5.11)
RDW: 14.6 % (ref 11.5–15.5)
WBC: 8.9 K/uL (ref 4.0–10.5)
nRBC: 0.5 % — ABNORMAL HIGH (ref 0.0–0.2)

## 2024-08-02 MED ORDER — ENSURE PLUS HIGH PROTEIN PO LIQD
237.0000 mL | Freq: Two times a day (BID) | ORAL | Status: DC
  Administered 2024-08-02 (×2): 237 mL via ORAL

## 2024-08-02 NOTE — Telephone Encounter (Signed)
 Pharmacy Patient Advocate Encounter  Received notification from [facility] MEDICAID that Prior Authorization for OXYCODONE  has been APPROVED from 08/02/2024 to 01/31/2025

## 2024-08-02 NOTE — TOC Progression Note (Signed)
 Transition of Care [facility]) - Progression Note    Patient Details  Name: Carmen [name] MRN: [id number] Date of Birth: [birth year]  [facility]) CM/SW Contact  Toy LITTIE Agar, RN Phone [phone]  08/02/2024, 4:07 PM  Clinical Narrative:    IP care manager following patient with high risk for readmission. Patient is from home where she functions independently. No HH no DME. PCP (Paseda, Folashade R, FNP). Patient states that she has insurance with access to affordable medication. Currently there are no care management needs. Will follow.    Expected Discharge Plan: Home/Self Care Barriers to Discharge: No Barriers Identified               Expected Discharge Plan and Services In-house Referral: NA Discharge Planning Services: NA Post Acute Care Choice: NA Living arrangements for the past 2 months: Apartment                 DME Arranged: N/A DME Agency: NA       HH Arranged: NA HH Agency: NA         Social Drivers of Health (SDOH) Interventions SDOH Screenings   Food Insecurity: No Food Insecurity (07/31/2024)  Housing: Low Risk  (07/31/2024)  Transportation Needs: No Transportation Needs (07/31/2024)  Utilities: Not At Risk (07/31/2024)  Alcohol Screen: Low Risk  (12/27/2023)  Depression (PHQ2-9): High Risk (07/20/2024)  Financial Resource Strain: Low Risk  (01/06/2024)   Received from [facility]  Recent Concern: Financial Resource Strain - Medium Risk (12/27/2023)  Physical Activity: Insufficiently Active (12/27/2023)  Social Connections: Moderately Isolated (01/29/2024)  Stress: Stress Concern Present (12/27/2023)  Tobacco Use: High Risk (07/31/2024)    Readmission Risk Interventions    08/02/2024    3:35 PM 01/30/2024   11:09 AM 08/03/2023    9:59 AM  Readmission Risk Prevention Plan  Transportation Screening Complete Complete Complete  PCP or Specialist Appt within 3-5 Days Complete  Not Complete  Not Complete comments    Patient to follow up with PCP at discharge  HRI or Home Care Consult Complete  Not Complete  HRI or Home Care Consult comments   not needed  Social Work Consult for Recovery Care Planning/Counseling Complete  Complete  Palliative Care Screening Not Applicable  Not Applicable  Medication Review Oceanographer) Complete Complete Complete  HRI or Home Care Consult  Complete   SW Recovery Care/Counseling Consult  Complete   Palliative Care Screening  Not Applicable   Skilled Nursing Facility  Not Applicable

## 2024-08-02 NOTE — Plan of Care (Signed)
  Problem: Clinical Measurements: Goal: Ability to maintain clinical measurements within normal limits will improve Outcome: Progressing   Problem: Elimination: Goal: Will not experience complications related to bowel motility Outcome: Progressing Goal: Will not experience complications related to urinary retention Outcome: Progressing   Problem: Pain Managment: Goal: General experience of comfort will improve and/or be controlled Outcome: Progressing   Problem: Safety: Goal: Ability to remain free from injury will improve Outcome: Progressing

## 2024-08-02 NOTE — Telephone Encounter (Signed)
 Pharmacy Patient Advocate Encounter   Received notification from CoverMyMeds that prior authorization for OXYCODONE  is required/requested.   Insurance verification completed.   The patient is insured through [place] MEDICAID.   Per test claim: PA required; PA submitted to above mentioned insurance via CoverMyMeds Key/confirmation #/EOC [id number] Status is pending

## 2024-08-02 NOTE — Telephone Encounter (Signed)
 Copied from CRM #[id number]. Topic: Clinical - Medication Prior Auth >> Aug 02, 2024  8:10 AM Carlatta H wrote: Reason for CRM: Oxycodone  HCl 10 MG TABS [[id number]] needs prior authorization from insurance

## 2024-08-02 NOTE — Progress Notes (Signed)
 Patient ID: Carmen [name], female   DOB: 1991/07/10, 33 y.o.   MRN: [id number] Subjective: Carmen [name]  is a 33 y.o. female with medical history significant for sickle cell disease, chronic pain syndrome, opiate dependence and tolerance, anemia of chronic disease and major depressive disorder, who presented to the emergency department with generalized body tenderness, worsening mid back and hip pain consistent with sickle cell pain in the past. Patient attributes stress as a precipitating factor.   Patient is reporting improved pain of 6/10 morning.  She denies subjective fever, headache, nausea or vomiting, Cough/ headache. No urinary symptoms, sick contacts or recent travels.   Objective:  Vital signs in last 24 hours:  Vitals:   08/02/24 0807 08/02/24 1053 08/02/24 1218 08/02/24 1347  BP:  108/71  114/81  Pulse:  (!) 59  65  Resp: 15 12 14 18   Temp:  99.8 F (37.7 C)  98.7 F (37.1 C)  TempSrc:  Oral  Oral  SpO2:  100%  100%  Weight:      Height:        Intake/Output from previous day:   Intake/Output Summary (Last 24 hours) at 08/02/2024 1359 Last data filed at 08/02/2024 0015 Gross per 24 hour  Intake 397 ml  Output --  Net 397 ml    Physical Exam: General: Alert, awake, oriented x3, in no acute distress.  HEENT: [place]/AT PEERL, EOMI Neck: Trachea midline,  no masses, no thyromegal,y no JVD, no carotid bruit OROPHARYNX:  Moist, No exudate/ erythema/lesions.  Heart: Regular rate and rhythm, without murmurs, rubs, gallops, PMI non-displaced, no heaves or thrills on palpation.  Lungs: Clear to auscultation, no wheezing or rhonchi noted. No increased vocal fremitus resonant to percussion  Abdomen: Soft, nontender, nondistended, positive bowel sounds, no masses no hepatosplenomegaly noted..  Neuro: No focal neurological deficits noted cranial nerves II through XII grossly intact. DTRs 2+ bilaterally upper and lower extremities. Strength 5 out of 5 in bilateral upper and lower  extremities. Musculoskeletal: Generalized body tenderness and bilateral hip  psychiatric: Patient alert and oriented x3, good insight and cognition, good recent to remote recall. Lymph node survey: No cervical axillary or inguinal lymphadenopathy noted.  Lab Results:  Basic Metabolic Panel:    Component Value Date/Time   NA 139 07/31/2024 0810   NA 140 02/24/2024 0947   K 3.4 (L) 07/31/2024 0810   CL 106 07/31/2024 0810   CO2 23 07/31/2024 0810   BUN 9 07/31/2024 0810   BUN 8 02/24/2024 0947   CREATININE 0.50 07/31/2024 1247   GLUCOSE 91 07/31/2024 0810   CALCIUM  9.1 07/31/2024 0810   CBC:    Component Value Date/Time   WBC 8.9 08/02/2024 0836   HGB 10.3 (L) 08/02/2024 0836   HGB 10.4 (L) 02/24/2024 0947   HCT 28.9 (L) 08/02/2024 0836   HCT 32.7 (L) 02/24/2024 0947   PLT 433 (H) 08/02/2024 0836   PLT 583 (H) 02/24/2024 0947   MCV 85.5 08/02/2024 0836   MCV 92 02/24/2024 0947   NEUTROABS 3.9 07/31/2024 0810   NEUTROABS 5.4 02/24/2024 0947   LYMPHSABS 2.4 07/31/2024 0810   LYMPHSABS 2.9 02/24/2024 0947   MONOABS 0.9 07/31/2024 0810   EOSABS 0.2 07/31/2024 0810   EOSABS 0.2 02/24/2024 0947   BASOSABS 0.1 07/31/2024 0810   BASOSABS 0.1 02/24/2024 0947    No results found for this or any previous visit (from the past 240 hours).  Studies/Results: No results found.   Medications: Scheduled Meds:  cyclobenzaprine   10 mg Oral QHS   enoxaparin  (LOVENOX ) injection  40 mg Subcutaneous Q24H   feeding supplement  237 mL Oral BID BM   FLUoxetine   20 mg Oral Daily   folic acid   1 mg Oral Daily   HYDROmorphone    Intravenous Q4H   ketorolac   15 mg Intravenous Q6H   morphine   60 mg Oral Q12H   senna-docusate  1 tablet Oral BID   Continuous Infusions: PRN Meds:.HYDROmorphone  (DILAUDID ) injection, naloxone  **AND** sodium chloride  flush, oxyCODONE , polyethylene glycol,  prochlorperazine   Consultants: None  Procedures: None  Antibiotics: None  Assessment/Plan: Principal Problem:   Sickle cell anemia with pain (HCC) Active Problems:   Anemia of chronic disease   Tobacco use disorder   Hb Sickle Cell Disease with Pain crisis: Patient's pain is gradually improving, IVF 0.45% Saline @ KVO, continue Dilaudid  PCA, IV Toradol  15 mg Q 6 H for a total of 5 days, continue oral home pain medications as ordered. Monitor vitals very closely, Re-evaluate pain scale regularly, 2 L of Oxygen  by [place]. Patient encouraged to ambulate on the hallway today.  Anemia of Chronic Disease: Hemoglobin at baseline no need for transfusion at this time will continue to monitor daily CBC Chronic pain Syndrome: Continue oral home pain medication.   Code Status: Full Code Family Communication: N/A Disposition Plan: Not yet ready for discharge  [name] Cover NP  If 7PM-7AM, please contact night-coverage.  08/02/2024, 1:59 PM  LOS: 2 days

## 2024-08-03 ENCOUNTER — Other Ambulatory Visit: Payer: Self-pay | Admitting: Nurse Practitioner

## 2024-08-03 ENCOUNTER — Ambulatory Visit: Payer: MEDICAID | Admitting: Radiology

## 2024-08-03 ENCOUNTER — Telehealth: Payer: Self-pay

## 2024-08-03 ENCOUNTER — Other Ambulatory Visit: Payer: Self-pay

## 2024-08-03 DIAGNOSIS — Z79891 Long term (current) use of opiate analgesic: Secondary | ICD-10-CM

## 2024-08-03 DIAGNOSIS — D571 Sickle-cell disease without crisis: Secondary | ICD-10-CM

## 2024-08-03 LAB — CBC
HCT: 30.6 % — ABNORMAL LOW (ref 36.0–46.0)
Hemoglobin: 10.8 g/dL — ABNORMAL LOW (ref 12.0–15.0)
MCH: 30.5 pg (ref 26.0–34.0)
MCHC: 35.3 g/dL (ref 30.0–36.0)
MCV: 86.4 fL (ref 80.0–100.0)
Platelets: 473 K/uL — ABNORMAL HIGH (ref 150–400)
RBC: 3.54 MIL/uL — ABNORMAL LOW (ref 3.87–5.11)
RDW: 14.8 % (ref 11.5–15.5)
WBC: 13.6 K/uL — ABNORMAL HIGH (ref 4.0–10.5)
nRBC: 0.8 % — ABNORMAL HIGH (ref 0.0–0.2)

## 2024-08-03 MED ORDER — OXYCODONE HCL 10 MG PO TABS: 10.0000 mg | ORAL_TABLET | Freq: Four times a day (QID) | ORAL | 0 refills | Status: DC | PRN

## 2024-08-03 NOTE — Discharge Summary (Signed)
 Physician Discharge Summary  Carmen [name] FMW:[id number] DOB: Nov 27, 1990 DOA: (Not on file)  PCP: Paseda, Folashade R, FNP  Admit date: (Not on file)  Discharge date: 08/03/2024  Discharge Diagnoses:  Active Problems:   * No active hospital problems. *   Discharge Condition: Stable  Disposition:  Pt is discharged home in good condition and is to follow up with Paseda, Folashade R, FNP this week to have labs evaluated. [name] is instructed to increase activity slowly and balance with rest for the next few days, and use prescribed medication to complete treatment of pain  Diet: Regular Wt Readings from Last 3 Encounters:  07/31/24 165 lb 5.5 oz (75 kg)  07/20/24 163 lb (73.9 kg)  05/28/24 173 lb (78.5 kg)    History of present illness:   Carmen [name]  is a 33 y.o. female with medical history significant for sickle cell disease, chronic pain syndrome, opiate dependence and tolerance, anemia of chronic disease and major depressive disorder, who presented to the emergency department with worsening mid back and hip pain consistent with sickle cell pain in the past. Patient attributes stress as a precipitating factor. Denies fever, cough and headache. No sick contacts or recent travels.    ED Course: BP 118/61  Pulse (!) 55  Temp 98.6 F (37 C) (Oral)  Resp 16  LMP 07/11/2024 (Exact Date)  SpO2 100%  Hgb 9.5, HCT 27.5, WBC 8.4, Platelets 474, RBC 3.37, reticulocytes 5.0, Patient was treated in the emergency department with IV fluid, IV pain medication with other adjunct treatments without resolution.  Patient was therefore admitted inpatient for ongoing sickle cell pain management. Hospital Course:  Patient was admitted for sickle cell pain crisis and managed appropriately with IVF, IV Dilaudid  via PCA and IV Toradol , as well as other adjunct therapies per sickle cell pain management protocols.  Today pain is significantly improved back to baseline.  Patient has no new  concerns.  She is ambulating without assistance and tolerating p.o. well.  Patient asked to be discharged home, she was therefore discharged home today in a hemodynamically stable condition.   [place] will follow-up with PCP within 1 week of this discharge. Silvina was counseled extensively about nonpharmacologic means of pain management, patient verbalized understanding and was appreciative of  the care received during this admission.   We discussed the need for good hydration, monitoring of hydration status, avoidance of heat, cold, stress, and infection triggers. We discussed the need to be adherent with taking her home medications. Patient was reminded of the need to seek medical attention immediately if any symptom of bleeding, anemia, or infection occurs.  Discharge Exam: There were no vitals filed for this visit. There were no vitals filed for this visit.  General appearance : Awake, alert, not in any distress. Speech Clear. Not toxic looking HEENT: Atraumatic and Normocephalic, pupils equally reactive to light and accomodation Neck: Supple, no JVD. No cervical lymphadenopathy.  Chest: Good air entry bilaterally, no added sounds  CVS: S1 S2 regular, no murmurs.  Abdomen: Bowel sounds present, Non tender and not distended with no gaurding, rigidity or rebound. Extremities: B/L Lower Ext shows no edema, both legs are warm to touch Neurology: Awake alert, and oriented X 3, CN II-XII intact, Non focal Skin: No Rash  Discharge Instructions   Allergies as of 08/03/2024       Reactions   Ketamine  Hives, Nausea And Vomiting, Other (See Comments)   Makes the patient feel funny also  Medication List      Notice   This visit is during an admission. Changes to the med list made in this visit will be reflected in the After Visit Summary of the admission.     The results of significant diagnostics from this hospitalization (including imaging, microbiology, ancillary and laboratory)  are listed below for reference.    Significant Diagnostic Studies: DG Chest 2 View Result Date: 07/31/2024 CLINICAL DATA:  Chest pain EXAM: CHEST - 2 VIEW COMPARISON:  Chest x-ray performed January 29, 2024 FINDINGS: No discrete lobar infiltrate. No pleural effusion or pneumothorax. Heart mediastinum are within normal limits. IMPRESSION: No active cardiopulmonary disease. Electronically Signed   By: Maude Naegeli M.D.   On: 07/31/2024 08:41    Microbiology: No results found for this or any previous visit (from the past 240 hours).   Labs: Basic Metabolic Panel: Recent Labs  Lab 07/31/24 0810 07/31/24 1247  NA 139  --   K 3.4*  --   CL 106  --   CO2 23  --   GLUCOSE 91  --   BUN 9  --   CREATININE 0.53 0.50  CALCIUM  9.1  --    Liver Function Tests: Recent Labs  Lab 07/31/24 0810  AST 24  ALT 16  ALKPHOS 72  BILITOT 1.2  PROT 6.9  ALBUMIN 4.1   No results for input(s): LIPASE, AMYLASE in the last 168 hours. No results for input(s): AMMONIA in the last 168 hours. CBC: Recent Labs  Lab 07/31/24 0810 07/31/24 1247 08/01/24 1016 08/02/24 0836 08/03/24 0829  WBC 7.5 8.4 7.8 8.9 13.6*  NEUTROABS 3.9  --   --   --   --   HGB 10.1* 9.5* 10.5* 10.3* 10.8*  HCT 28.9* 27.5* 30.3* 28.9* 30.6*  MCV 85.3 85.4 85.4 85.5 86.4  PLT 513* 474* 462* 433* 473*   Cardiac Enzymes: No results for input(s): CKTOTAL, CKMB, CKMBINDEX, TROPONINI in the last 168 hours. BNP: Invalid input(s): POCBNP CBG: No results for input(s): GLUCAP in the last 168 hours.  Time coordinating discharge: 50 minutes  Signed:  [name] Cover NP  [facility] 08/03/2024, 2:05 PM

## 2024-08-03 NOTE — Progress Notes (Signed)
 Reviewed PDMP substance reporting system prior to prescribing opiate medications. No inconsistencies noted.     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  Dispense: 60 tablet; Refill: 0  2. Chronic prescription opiate use  - Oxycodone HCl 10 MG TABS; Take 1 tablet (10 mg total) by mouth every 6 (six) hours as needed (pain).  Dispense: 60 tablet; Refill: 0

## 2024-08-03 NOTE — Plan of Care (Signed)
  Problem: Self-Care: Goal: Ability to incorporate actions that prevent/reduce pain crisis will improve Outcome: Adequate for Discharge   Problem: Tissue Perfusion: Goal: Complications related to inadequate tissue perfusion will be avoided or minimized Outcome: Adequate for Discharge   Problem: Respiratory: Goal: Pulmonary complications will be avoided or minimized Outcome: Adequate for Discharge   Problem: Fluid Volume: Goal: Ability to maintain a balanced intake and output will improve Outcome: Adequate for Discharge   Problem: Education: Goal: Knowledge of General Education information will improve Description: Including pain rating scale, medication(s)/side effects and non-pharmacologic comfort measures Outcome: Adequate for Discharge   Problem: Health Behavior/Discharge Planning: Goal: Ability to manage health-related needs will improve Outcome: Adequate for Discharge   Problem: Clinical Measurements: Goal: Ability to maintain clinical measurements within normal limits will improve Outcome: Adequate for Discharge Goal: Will remain free from infection Outcome: Adequate for Discharge Goal: Diagnostic test results will improve Outcome: Adequate for Discharge   Problem: Activity: Goal: Risk for activity intolerance will decrease Outcome: Adequate for Discharge   Problem: Safety: Goal: Ability to remain free from injury will improve Outcome: Adequate for Discharge   Problem: Respiratory: Goal: Acute Chest Syndrome will be identified early to prevent complications Outcome: Progressing   Problem: Sensory: Goal: Pain level will decrease with appropriate interventions Outcome: Progressing   Problem: Health Behavior: Goal: Postive changes in compliance with treatment and prescription regimens will improve Outcome: Progressing   Problem: Clinical Measurements: Goal: Respiratory complications will improve Outcome: Progressing   Problem: Nutrition: Goal: Adequate  nutrition will be maintained Outcome: Progressing   Problem: Skin Integrity: Goal: Risk for impaired skin integrity will decrease Outcome: Progressing

## 2024-08-03 NOTE — Telephone Encounter (Signed)
 Copied from CRM [id number]. Topic: General - Other >> Aug 03, 2024 10:24 AM Larissa RAMAN wrote: Reason for CRM: Att: Suzen -Patient returning missed call. She states she has been discharged from hospital and would like a callback to speak with you.  Lvm advising of pick of medication at pharmacy. KH

## 2024-08-03 NOTE — Telephone Encounter (Signed)
 LVM advising pt we can not fill until she is D/C from the hospital. [facility]

## 2024-08-03 NOTE — Discharge Summary (Deleted)
   The note originally documented on this encounter has been moved the the encounter in which it belongs.

## 2024-08-03 NOTE — Plan of Care (Signed)
 Went over discharge with patient, she verbalized understanding, Piv intact upon removal, vitals stable, patient leaving by private vehicle.    Problem: Education: Goal: Knowledge of vaso-occlusive preventative measures will improve 08/03/2024 0945 by Theo Isaiah MATSU, LPN Outcome: Progressing 08/03/2024 0945 by Theo Isaiah MATSU, LPN Outcome: Adequate for Discharge Goal: Awareness of infection prevention will improve 08/03/2024 0945 by Theo Isaiah MATSU, LPN Outcome: Progressing 08/03/2024 0945 by Theo Isaiah MATSU, LPN Outcome: Adequate for Discharge Goal: Awareness of signs and symptoms of anemia will improve 08/03/2024 0945 by Theo Isaiah MATSU, LPN Outcome: Progressing 08/03/2024 0945 by Theo Isaiah MATSU, LPN Outcome: Adequate for Discharge Goal: Long-term complications will improve 08/03/2024 0945 by Theo Isaiah MATSU, LPN Outcome: Progressing 08/03/2024 0945 by Theo Isaiah MATSU, LPN Outcome: Adequate for Discharge   Problem: Self-Care: Goal: Ability to incorporate actions that prevent/reduce pain crisis will improve 08/03/2024 0945 by Theo Isaiah MATSU, LPN Outcome: Progressing 08/03/2024 0945 by Theo Isaiah MATSU, LPN Outcome: Adequate for Discharge   Problem: Bowel/Gastric: Goal: Gut motility will be maintained 08/03/2024 0945 by Theo Isaiah MATSU, LPN Outcome: Progressing 08/03/2024 0945 by Theo Isaiah MATSU, LPN Outcome: Adequate for Discharge   Problem: Tissue Perfusion: Goal: Complications related to inadequate tissue perfusion will be avoided or minimized 08/03/2024 0945 by Theo Isaiah MATSU, LPN Outcome: Progressing 08/03/2024 0945 by Theo Isaiah MATSU, LPN Outcome: Adequate for Discharge   Problem: Respiratory: Goal: Pulmonary complications will be avoided or minimized 08/03/2024 0945 by Theo Isaiah MATSU, LPN Outcome: Progressing 08/03/2024 0945 by Theo Isaiah MATSU, LPN Outcome: Adequate for Discharge Goal: Acute Chest Syndrome will be  identified early to prevent complications 08/03/2024 0945 by Theo Isaiah MATSU, LPN Outcome: Progressing 08/03/2024 0945 by Theo Isaiah MATSU, LPN Outcome: Adequate for Discharge   Problem: Fluid Volume: Goal: Ability to maintain a balanced intake and output will improve 08/03/2024 0945 by Theo Isaiah MATSU, LPN Outcome: Progressing 08/03/2024 0945 by Theo Isaiah MATSU, LPN Outcome: Adequate for Discharge   Problem: Sensory: Goal: Pain level will decrease with appropriate interventions 08/03/2024 0945 by Theo Isaiah MATSU, LPN Outcome: Progressing 08/03/2024 0945 by Theo Isaiah MATSU, LPN Outcome: Adequate for Discharge   Problem: Health Behavior: Goal: Postive changes in compliance with treatment and prescription regimens will improve 08/03/2024 0945 by Theo Isaiah MATSU, LPN Outcome: Progressing 08/03/2024 0945 by Theo Isaiah MATSU, LPN Outcome: Adequate for Discharge   Problem: Education: Goal: Knowledge of General Education information will improve Description: Including pain rating scale, medication(s)/side effects and non-pharmacologic comfort measures 08/03/2024 0945 by Theo Isaiah MATSU, LPN Outcome: Progressing 08/03/2024 0945 by Theo Isaiah MATSU, LPN Outcome: Adequate for Discharge   Problem: Health Behavior/Discharge Planning: Goal: Ability to manage health-related needs will improve 08/03/2024 0945 by Theo Isaiah MATSU, LPN Outcome: Progressing 08/03/2024 0945 by Theo Isaiah MATSU, LPN Outcome: Adequate for Discharge   Problem: Clinical Measurements: Goal: Ability to maintain clinical measurements within normal limits will improve 08/03/2024 0945 by Theo Isaiah MATSU, LPN Outcome: Progressing 08/03/2024 0945 by Theo Isaiah MATSU, LPN Outcome: Adequate for Discharge Goal: Will remain free from infection 08/03/2024 0945 by Theo Isaiah MATSU, LPN Outcome: Progressing 08/03/2024 0945 by Theo Isaiah MATSU, LPN Outcome: Adequate for Discharge Goal:  Diagnostic test results will improve 08/03/2024 0945 by Theo Isaiah MATSU, LPN Outcome: Progressing 08/03/2024 0945 by Theo Isaiah MATSU, LPN Outcome: Adequate for Discharge Goal: Respiratory complications will improve 08/03/2024 0945 by Theo Isaiah MATSU, LPN Outcome: Progressing 08/03/2024 0945 by Theo Isaiah MATSU, LPN Outcome: Adequate for Discharge Goal: Cardiovascular complication will  be avoided 08/03/2024 0945 by Theo Isaiah MATSU, LPN Outcome: Progressing 08/03/2024 0945 by Theo Isaiah MATSU, LPN Outcome: Adequate for Discharge   Problem: Activity: Goal: Risk for activity intolerance will decrease 08/03/2024 0945 by Theo Isaiah MATSU, LPN Outcome: Progressing 08/03/2024 0945 by Theo Isaiah MATSU, LPN Outcome: Adequate for Discharge   Problem: Nutrition: Goal: Adequate nutrition will be maintained 08/03/2024 0945 by Theo Isaiah MATSU, LPN Outcome: Progressing 08/03/2024 0945 by Theo Isaiah MATSU, LPN Outcome: Adequate for Discharge   Problem: Coping: Goal: Level of anxiety will decrease 08/03/2024 0945 by Theo Isaiah MATSU, LPN Outcome: Progressing 08/03/2024 0945 by Theo Isaiah MATSU, LPN Outcome: Adequate for Discharge   Problem: Elimination: Goal: Will not experience complications related to bowel motility 08/03/2024 0945 by Theo Isaiah MATSU, LPN Outcome: Progressing 08/03/2024 0945 by Theo Isaiah MATSU, LPN Outcome: Adequate for Discharge Goal: Will not experience complications related to urinary retention 08/03/2024 0945 by Theo Isaiah MATSU, LPN Outcome: Progressing 08/03/2024 0945 by Theo Isaiah MATSU, LPN Outcome: Adequate for Discharge   Problem: Pain Managment: Goal: General experience of comfort will improve and/or be controlled 08/03/2024 0945 by Theo Isaiah MATSU, LPN Outcome: Progressing 08/03/2024 0945 by Theo Isaiah MATSU, LPN Outcome: Adequate for Discharge   Problem: Safety: Goal: Ability to remain free from injury will  improve 08/03/2024 0945 by Theo Isaiah MATSU, LPN Outcome: Progressing 08/03/2024 0945 by Theo Isaiah MATSU, LPN Outcome: Adequate for Discharge   Problem: Skin Integrity: Goal: Risk for impaired skin integrity will decrease 08/03/2024 0945 by Theo Isaiah MATSU, LPN Outcome: Progressing 08/03/2024 0945 by Theo Isaiah MATSU, LPN Outcome: Adequate for Discharge

## 2024-08-06 ENCOUNTER — Other Ambulatory Visit: Payer: Self-pay | Admitting: Nurse Practitioner

## 2024-08-06 ENCOUNTER — Other Ambulatory Visit: Payer: Self-pay

## 2024-08-06 ENCOUNTER — Telehealth: Payer: Self-pay

## 2024-08-06 DIAGNOSIS — Z79891 Long term (current) use of opiate analgesic: Secondary | ICD-10-CM

## 2024-08-06 DIAGNOSIS — D571 Sickle-cell disease without crisis: Secondary | ICD-10-CM

## 2024-08-06 MED ORDER — MORPHINE SULFATE ER 60 MG PO TBCR: 60.0000 mg | EXTENDED_RELEASE_TABLET | Freq: Two times a day (BID) | ORAL | 0 refills | Status: DC

## 2024-08-06 NOTE — Transitions of Care (Post Inpatient/ED Visit) (Signed)
   08/06/2024  Name: Carmen [name] MRN: [id number] DOB: [birth year]  Today's TOC FU Call Status: Today's TOC FU Call Status:: Unsuccessful Call (1st Attempt) Unsuccessful Call (1st Attempt) Date: 08/06/24  Attempted to reach the patient regarding the most recent Inpatient/ED visit.  Follow Up Plan: Additional outreach attempts will be made to reach the patient to complete the Transitions of Care (Post Inpatient/ED visit) call.   Arvin Seip RN, BSN, CCM [employer], Population Health Case Manager Phone: [phone]

## 2024-08-06 NOTE — Telephone Encounter (Signed)
 Reviewed PDMP substance reporting system prior to prescribing opiate medications. No inconsistencies noted.   1. 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  Dispense: 60 tablet; Refill: 0  2. Chronic prescription opiate use  - morphine (MS CONTIN) 60 MG 12 hr tablet; Take 1 tablet (60 mg total) by mouth every 12 (twelve) hours.  Dispense: 60 tablet; Refill: 0

## 2024-08-06 NOTE — Telephone Encounter (Signed)
 Copied from CRM [id number]. Topic: Clinical - Medication Refill >> Aug 06, 2024  1:56 PM Shanda MATSU wrote: Medication: morphine  (MS CONTIN ) 60 MG 12 hr tablet  Has the patient contacted their pharmacy? Yes (Agent: If no, request that the patient contact the pharmacy for the refill. If patient does not wish to contact the pharmacy document the reason why and proceed with request.) (Agent: If yes, when and what did the pharmacy advise?)  This is the patient's preferred pharmacy:  [id number] - [place], [place] - [address] AT [facility] [address] [place] [address] Phone: [phone] Fax: [phone]    Is this the correct pharmacy for this prescription? Yes If no, delete pharmacy and type the correct one.   Has the prescription been filled recently? No  Is the patient out of the medication? No  Has the patient been seen for an appointment in the last year OR does the patient have an upcoming appointment? Yes  Can we respond through MyChart? Yes  Agent: Please be advised that Rx refills may take up to 3 business days. We ask that you follow-up with your pharmacy.

## 2024-08-06 NOTE — Telephone Encounter (Signed)
 Please advise [facility]

## 2024-08-06 NOTE — Telephone Encounter (Signed)
 May have already sent . If so please disregard.  KH

## 2024-08-07 ENCOUNTER — Telehealth: Payer: Self-pay

## 2024-08-07 NOTE — Transitions of Care (Post Inpatient/ED Visit) (Signed)
   08/07/2024  Name: Carmen [name] MRN: [id number] DOB: 1990/12/09  Today's TOC FU Call Status: Today's TOC FU Call Status:: Unsuccessful Call (2nd Attempt) Unsuccessful Call (2nd Attempt) Date: 08/07/24  Attempted to reach the patient regarding the most recent Inpatient/ED visit.  Follow Up Plan: Additional outreach attempts will be made to reach the patient to complete the Transitions of Care (Post Inpatient/ED visit) call.    Shenique Childers J. Shalita Notte RN, MSN [facility], [facility] RN Care Manager Direct Dial: [phone]  Fax: [phone] Website: [email or web address]

## 2024-08-08 ENCOUNTER — Telehealth: Payer: Self-pay

## 2024-08-08 ENCOUNTER — Telehealth: Payer: Self-pay | Admitting: Nurse Practitioner

## 2024-08-08 ENCOUNTER — Other Ambulatory Visit: Payer: Self-pay

## 2024-08-08 NOTE — Telephone Encounter (Signed)
 Pharmacy Patient Advocate Encounter   Received notification from Physician's Office that prior authorization for MORPHINE  is required/requested.   Insurance verification completed.   The patient is insured through [place] MEDICAID.   Per test claim: PA required; PA submitted to above mentioned insurance via CoverMyMeds Key/confirmation #/EOC [id number] Status is pending

## 2024-08-08 NOTE — Transitions of Care (Post Inpatient/ED Visit) (Signed)
   08/08/2024  Name: Carmen [name] MRN: [id number] DOB: January 11, 1991  Today's TOC FU Call Status: Today's TOC FU Call Status:: Unsuccessful Call (3rd Attempt) Unsuccessful Call (3rd Attempt) Date: 08/08/24  Attempted to reach the patient regarding the most recent Inpatient/ED visit.  Follow Up Plan: No further outreach attempts will be made at this time. We have been unable to contact the patient.  Alan Ee, RN, BSN, CEN [employer]- Transition of Care Team.  [facility] [phone]

## 2024-08-09 ENCOUNTER — Other Ambulatory Visit: Payer: Self-pay

## 2024-08-09 ENCOUNTER — Ambulatory Visit: Payer: MEDICAID | Admitting: Radiology

## 2024-08-10 ENCOUNTER — Other Ambulatory Visit: Payer: Self-pay

## 2024-08-17 NOTE — Telephone Encounter (Signed)
 patient

## 2024-08-20 ENCOUNTER — Ambulatory Visit (INDEPENDENT_AMBULATORY_CARE_PROVIDER_SITE_OTHER): Payer: MEDICAID | Admitting: Mental Health

## 2024-08-20 ENCOUNTER — Other Ambulatory Visit: Payer: Self-pay | Admitting: Nurse Practitioner

## 2024-08-20 DIAGNOSIS — F322 Major depressive disorder, single episode, severe without psychotic features: Secondary | ICD-10-CM | POA: Insufficient documentation

## 2024-08-20 DIAGNOSIS — D571 Sickle-cell disease without crisis: Secondary | ICD-10-CM

## 2024-08-20 DIAGNOSIS — Z79891 Long term (current) use of opiate analgesic: Secondary | ICD-10-CM

## 2024-08-20 MED ORDER — OXYCODONE HCL 10 MG PO TABS: 10.0000 mg | ORAL_TABLET | Freq: Four times a day (QID) | ORAL | 0 refills | Status: DC | PRN

## 2024-08-20 NOTE — Telephone Encounter (Signed)
 Copied from CRM #[id number]. Topic: Clinical - Medication Refill >> Aug 20, 2024  9:55 AM Donna BRAVO wrote: Medication: Oxycodone  HCl 10 MG TABS  Has the patient contacted their pharmacy? Yes Pharmacy stated a new prescription is needed along with the prior authorization   This is the patient's preferred pharmacy:   [id number] - [place], [place] - [address] AT [facility] [address] [place] [address] Phone: [phone] Fax: [phone]   Is this the correct pharmacy for this prescription? Yes If no, delete pharmacy and type the correct one.   Has the prescription been filled recently? Yes  Is the patient out of the medication? Yes  Has the patient been seen for an appointment in the last year OR does the patient have an upcoming appointment? Yes  Can we respond through MyChart? Yes  Agent: Please be advised that Rx refills may take up to 3 business days. We ask that you follow-up with your pharmacy.
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
## 2024-08-20 NOTE — Telephone Encounter (Signed)
 Reviewed PDMP substance reporting system prior to prescribing opiate medications. No inconsistencies noted.     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  Dispense: 60 tablet; Refill: 0  2. Chronic prescription opiate use  - Oxycodone HCl 10 MG TABS; Take 1 tablet (10 mg total) by mouth every 6 (six) hours as needed (pain).  Dispense: 60 tablet; Refill: 0

## 2024-08-21 ENCOUNTER — Other Ambulatory Visit: Payer: Self-pay

## 2024-08-21 ENCOUNTER — Telehealth: Payer: Self-pay

## 2024-08-21 NOTE — Telephone Encounter (Signed)
 Rx Refill on Oxycodone  HCl 10 MG TABS [[id number]]

## 2024-08-23 NOTE — Telephone Encounter (Signed)
 Called patient unable to make contact, voicemail has been left

## 2024-08-28 ENCOUNTER — Ambulatory Visit (INDEPENDENT_AMBULATORY_CARE_PROVIDER_SITE_OTHER): Payer: MEDICAID | Admitting: Nurse Practitioner

## 2024-08-28 VITALS — BP 103/59 | HR 84 | Ht 63.0 in | Wt 160.0 lb

## 2024-08-28 DIAGNOSIS — F17200 Nicotine dependence, unspecified, uncomplicated: Secondary | ICD-10-CM

## 2024-08-28 DIAGNOSIS — Z23 Encounter for immunization: Secondary | ICD-10-CM

## 2024-08-28 DIAGNOSIS — D571 Sickle-cell disease without crisis: Secondary | ICD-10-CM

## 2024-08-28 DIAGNOSIS — F322 Major depressive disorder, single episode, severe without psychotic features: Secondary | ICD-10-CM

## 2024-08-28 DIAGNOSIS — F1290 Cannabis use, unspecified, uncomplicated: Secondary | ICD-10-CM

## 2024-08-28 DIAGNOSIS — F411 Generalized anxiety disorder: Secondary | ICD-10-CM

## 2024-08-28 DIAGNOSIS — E559 Vitamin D deficiency, unspecified: Secondary | ICD-10-CM

## 2024-08-28 MED ORDER — NICOTINE 14 MG/24HR TD PT24: 14.0000 mg | MEDICATED_PATCH | Freq: Every day | TRANSDERMAL | 0 refills | Status: DC

## 2024-08-28 MED ORDER — FLUOXETINE HCL 40 MG PO CAPS: 40.0000 mg | ORAL_CAPSULE | Freq: Every day | ORAL | 3 refills | Status: DC

## 2024-08-28 NOTE — Assessment & Plan Note (Signed)
 Cessation encouraged

## 2024-08-28 NOTE — Assessment & Plan Note (Addendum)
 Sickle cell disease with chronic pain Chronic lower back pain managed with medication. Advised on lifestyle modifications to prevent exacerbation. - Continue MS Contin  60 mg every 12 hours. - Continue oxycodone  10 mg every 6 hours as needed. Takes cyclobenzaprine  10 mg at bedtime as needed low back pain risk of excessive sedation when taking cyclobenzaprine  with opioids discussed, stated that she does not take medication at the same time with oxycodone , advised to report excessive sedation We discussed the need for good hydration, monitoring of hydration status, avoidance of heat, cold, stress, and infection triggers. We discussed the need to be adherent with taking folic acid  and other home medications. Patient was reminded of the need to seek medical attention immediately if any symptom of bleeding, anemia, or infection occurs.   Encouraged to keep medications in a safe and secure cabinet and reports excessive sedation

## 2024-08-28 NOTE — Assessment & Plan Note (Signed)
>>  ASSESSMENT AND PLAN FOR SEVERE DEPRESSIVE EPISODE WITHOUT PSYCHOTIC SYMPTOMS (HCC) WRITTEN ON 08/28/2024  7:20 PM BY Tal Kempker R, FNP     08/28/2024   11:12 AM 08/20/2024    1:09 PM 07/20/2024    2:00 PM 05/28/2024   10:20 AM 02/24/2024    9:25 AM  Depression screen PHQ 2/9  Decreased Interest 3  3 0 0  Down, Depressed, Hopeless 3  3 0 0  PHQ - 2 Score 6  6 0 0  Altered sleeping 3  3 0   Tired, decreased energy 3  3 2    Change in appetite 3  3 0   Feeling bad or failure about yourself  3  3 0   Trouble concentrating 3  3 0   Moving slowly or fidgety/restless 1  3 0   Suicidal thoughts 0  0 0   PHQ-9 Score 22  24 2    Difficult doing work/chores Very difficult  Extremely dIfficult Not difficult at all      Information is confidential and restricted. Go to Review Flowsheets to unlock data.   Major depressive disorder and generalized anxiety disorder Symptoms of depression and anxiety persist. Care established with psychiatrist and therapist. - Increase Prozac  to 40 mg daily. - Continue therapy sessions with therapist and follow up with psychiatrist

## 2024-08-28 NOTE — Assessment & Plan Note (Signed)
 Last vitamin D  Lab Results  Component Value Date   VD25OH 34.5 05/28/2024  currently managed with weekly supplementation. - Continue vitamin D  50,000 units once a week.

## 2024-08-28 NOTE — Assessment & Plan Note (Addendum)
 Carmen [name]

## 2024-08-28 NOTE — Assessment & Plan Note (Addendum)
  Smoking two cigarettes daily. Interested in nicotine  patches. - Prescribe nicotine  patches.

## 2024-08-28 NOTE — Patient Instructions (Signed)
 1. Need for influenza vaccination (Primary)   2. Hb-SS disease without crisis (HCC)  - Sickle Cell Panel - ToxAssure Flex 15, Ur  3. Current severe episode of major depressive disorder without psychotic features, unspecified whether recurrent (HCC)  - FLUxetine (PROZAC ) 40 MG capsule; Take 1 capsule (40 mg total) by mouth daily.  Dispense: 60 capsule; Refill: 3  4. Marijuana smoker   5. Chronic low back pain without sciatica, unspecified back pain laterality   6. Tobacco use disorder, mild, abuse  - nicotine  (NICODERM CQ  - DOSED IN MG/24 HOURS) 14 mg/24hr patch; Place 1 patch (14 mg total) onto the skin daily.  Dispense: 42 patch; Refill: 0  7. GAD (generalized anxiety disorder)  - FLUoxetine  (PROZAC ) 40 MG capsule; Take 1 capsule (40 mg total) by mouth daily.  Dispense: 60 capsule; Refill: 3    It is important that you exercise regularly at least 30 minutes 5 times a week as tolerated  Think about what you will eat, plan ahead. Choose  clean, green, fresh or frozen over canned, processed or packaged foods which are more sugary, salty and fatty. 70 to 75% of food eaten should be vegetables and fruit. Three meals at set times with snacks allowed between meals, but they must be fruit or vegetables. Aim to eat over a 12 hour period , example 7 am to 7 pm, and STOP after  your last meal of the day. Drink water ,generally about 64 ounces per day, no other drink is as healthy. Fruit juice is best enjoyed in a healthy way, by EATING the fruit.  Thanks for choosing [facility] we consider it a privelige to serve you.

## 2024-08-28 NOTE — Assessment & Plan Note (Signed)
    08/28/2024   11:12 AM 08/20/2024    1:09 PM 07/20/2024    2:00 PM 05/28/2024   10:20 AM 02/24/2024    9:25 AM  Depression screen PHQ 2/9  Decreased Interest 3  3 0 0  Down, Depressed, Hopeless 3  3 0 0  PHQ - 2 Score 6  6 0 0  Altered sleeping 3  3 0   Tired, decreased energy 3  3 2    Change in appetite 3  3 0   Feeling bad or failure about yourself  3  3 0   Trouble concentrating 3  3 0   Moving slowly or fidgety/restless 1  3 0   Suicidal thoughts 0  0 0   PHQ-9 Score 22  24 2    Difficult doing work/chores Very difficult  Extremely dIfficult Not difficult at all      Information is confidential and restricted. Go to Review Flowsheets to unlock data.   Major depressive disorder and generalized anxiety disorder Symptoms of depression and anxiety persist. Care established with psychiatrist and therapist. - Increase Prozac  to 40 mg daily. - Continue therapy sessions with therapist and follow up with psychiatrist

## 2024-08-28 NOTE — Assessment & Plan Note (Addendum)
    08/28/2024   11:14 AM 08/20/2024    1:09 PM 05/28/2024   10:25 AM 02/24/2024    9:25 AM  GAD 7 : Generalized Anxiety Score  Nervous, Anxious, on Edge 3  0 0  Control/stop worrying 3  0 0  Worry too much - different things 3  0 0  Trouble relaxing 3  3 0  Restless 3  1 0  Easily annoyed or irritable 3  3 0  Afraid - awful might happen 1  0 0  Total GAD 7 Score 19  7 0  Anxiety Difficulty Very difficult  Not difficult at all Not difficult at all     Information is confidential and restricted. Go to Review Flowsheets to unlock data.   Major depressive disorder and generalized anxiety disorder Symptoms of depression and anxiety persist. Care established with psychiatrist and therapist. - Increase Prozac  to 40 mg daily. - Continue therapy sessions with therapist and follow up with psychiatrist

## 2024-08-28 NOTE — Progress Notes (Signed)
 Established Patient Office Visit  Subjective:  Patient ID: Carmen [name], female    DOB: 1991-08-29  Age: 33 y.o. MRN: [id number]  CC:  Chief Complaint  Patient presents with   Sickle Cell Anemia   Follow-up    HPI   Discussed the use of AI scribe software for clinical note transcription with the patient, who gave verbal consent to proceed.  History of Present Illness Carmen [name] is a 33 year old female   has a past medical history of Anxiety, Headache(784.0), Heart murmur, Sickle cell crisis (HCC), Syphilis (2015), and Thrombocytosis (11/22/2014).  who presents for a follow-up visit.  She has a history of sickle cell disease and was hospitalized earlier this month for a sickle cell crisis. Her current pain level is 4 out of 10, primarily located in her lower back, and is typically worse in rainy and gloomy weather. She reports taking MS Contin  60 mg every 12 hours and oxycodone  10 mg every 6 hours as needed.  She experiences symptoms of depression and anxiety nearly every day, including little interest or pleasure in activities, feeling down, trouble sleeping, feeling tired, poor appetite, feeling bad about herself, trouble concentrating, and restlessness. She has recently started seeing a psychiatrist and therapist, with a psychiatrist appointment scheduled for November. She is currently on Prozac  20 mg daily.  She continues to take folic acid  1 mg daily and vitamin D  50,000 units weekly for vitamin D  deficiency. She has not yet received her flu vaccine this year.  She smokes two cigarettes a day and occasionally uses marijuana to help with appetite and nausea, but not daily. She wants to restart nicotine  patches as they were effective in the past. No fever, chills, chest pain, or shortness of breath.  Assessment and Plan Assessment & Plan         Past Medical History:  Diagnosis Date   Anxiety    Headache(784.0)    Heart murmur    Sickle cell crisis (HCC)    Syphilis  2015   Was diagnosed and received one injection of antibiotics   Thrombocytosis 11/22/2014     CBC Latest Ref Rng & Units 12/13/2018 12/11/2018 12/10/2018 WBC 4.0 - 10.5 K/uL 14.8(H) 13.2(H) 15.9(H) Hemoglobin 12.0 - 15.0 g/dL 8.2(L) 7.7(L) 8.1(L) Hematocrit 36.0 - 46.0 % 23.7(L) 23.2(L) 24.5(L) Platelets 150 - 400 K/uL [phone]      Past Surgical History:  Procedure Laterality Date   CESAREAN SECTION N/A 02/05/2019   Procedure: CESAREAN SECTION;  Surgeon: Lorence Ozell CROME, MD;  Location: [facility];  Service: Obstetrics;  Laterality: N/A;   CHOLECYSTECTOMY N/A 11/30/2014   Procedure: LAPAROSCOPIC CHOLECYSTECTOMY SINGLE SITE WITH INTRAOPERATIVE CHOLANGIOGRAM;  Surgeon: Elspeth Schultze, MD;  Location: [facility];  Service: General;  Laterality: N/A;   SPLENECTOMY      Family History  Problem Relation Age of Onset   Hypertension Mother    Sickle cell anemia Sister    Kidney disease Sister        Lupus   Arthritis Sister    Sickle cell anemia Sister    Sickle cell trait Sister    Heart disease Maternal Aunt        CABG   Heart disease Maternal Uncle        CABG   Lupus Sister     Social History   Socioeconomic History   Marital status: Single    Spouse name: Not on file   Number of children: 1  Years of education: Not on file   Highest education level: 12th grade  Occupational History   Not on file  Tobacco Use   Smoking status: Some Days    Types: Cigarettes   Smokeless tobacco: Never  Vaping Use   Vaping status: Never Used  Substance and Sexual Activity   Alcohol use: No   Drug use: Yes    Types: Marijuana    Comment: marijuana   Sexual activity: Yes    Partners: Male    Birth control/protection: None    Comment: menarche 33yo, sexual debut 33yo  Other Topics Concern   Not on file  Social History Narrative   Lives with her mother and her son   Social Drivers of Corporate Investment Banker Strain: Low Risk  (08/28/2024)   Overall Financial Resource Strain (CARDIA)     Difficulty of Paying Living Expenses: Not very hard  Food Insecurity: Food Insecurity Present (08/28/2024)   Hunger Vital Sign    Worried About Running Out of Food in the Last Year: Sometimes true    Ran Out of Food in the Last Year: Sometimes true  Transportation Needs: Unmet Transportation Needs (08/28/2024)   PRAPARE - Transportation    Lack of Transportation (Medical): Yes    Lack of Transportation (Non-Medical): Yes  Physical Activity: Sufficiently Active (08/28/2024)   Exercise Vital Sign    Days of Exercise per Week: 3 days    Minutes of Exercise per Session: 60 min  Stress: Stress Concern Present (08/28/2024)   [employer] of Occupational Health - Occupational Stress Questionnaire    Feeling of Stress: Very much  Social Connections: Socially Isolated (08/28/2024)   Social Connection and Isolation Panel    Frequency of Communication with Friends and Family: More than three times a week    Frequency of Social Gatherings with Friends and Family: Twice a week    Attends Religious Services: Patient declined    Database Administrator or Organizations: No    Attends Engineer, Structural: Not on file    Marital Status: Never married  Intimate Partner Violence: Not At Risk (07/31/2024)   Humiliation, Afraid, Rape, and Kick questionnaire    Fear of Current or Ex-Partner: No    Emotionally Abused: No    Physically Abused: No    Sexually Abused: No    Outpatient Medications Prior to Visit  Medication Sig Dispense Refill   cyclobenzaprine  (FLEXERIL ) 10 MG tablet TAKE 1 TABLET BY MOUTH DAILY AT BEDTIME AS NEEDED FOR MUSCLE SPASMS (Patient taking differently: Take 10 mg by mouth daily as needed for muscle spasms.) 30 tablet 2   folic acid  (FOLVITE ) 1 MG tablet Take 1 tablet (1 mg total) by mouth daily. 90 tablet 3   morphine  (MS CONTIN ) 60 MG 12 hr tablet Take 1 tablet (60 mg total) by mouth every 12 (twelve) hours. 60 tablet 0   naloxone  (NARCAN ) nasal spray 4 mg/0.1 mL  1 nasal spray in one nostril as needed for opoid overdose 1 each 2   Oxycodone  HCl 10 MG TABS Take 1 tablet (10 mg total) by mouth every 6 (six) hours as needed (pain). 60 tablet 0   Vitamin D , Ergocalciferol , (DRISDOL ) 1.25 MG (50000 UNIT) CAPS capsule TAKE 1 CAPSULE BY MOUTH ONE TIME PER WEEK 12 capsule 4   FLUoxetine  (PROZAC ) 20 MG capsule Take 1 capsule (20 mg total) by mouth daily. 90 capsule 0   No facility-administered medications prior to visit.    Allergies  Allergen Reactions   Ketamine  Hives, Nausea And Vomiting and Other (See Comments)    Makes the patient feel funny also    ROS Review of Systems  Constitutional:  Negative for appetite change, chills, fatigue and fever.  HENT:  Negative for congestion, postnasal drip, rhinorrhea and sneezing.   Respiratory:  Negative for cough, shortness of breath and wheezing.   Cardiovascular:  Negative for chest pain, palpitations and leg swelling.  Gastrointestinal:  Negative for abdominal pain, constipation, nausea and vomiting.  Genitourinary:  Negative for difficulty urinating, dysuria, flank pain and frequency.  Musculoskeletal:  Positive for arthralgias and back pain. Negative for joint swelling and myalgias.  Skin:  Negative for color change, pallor, rash and wound.  Neurological:  Negative for dizziness, facial asymmetry, weakness, numbness and headaches.  Psychiatric/Behavioral:  Positive for sleep disturbance. Negative for behavioral problems, confusion, self-injury and suicidal ideas.       Objective:    Physical Exam Vitals and nursing note reviewed.  Constitutional:      General: She is not in acute distress.    Appearance: Normal appearance. She is not ill-appearing, toxic-appearing or diaphoretic.  HENT:     Mouth/Throat:     Mouth: Mucous membranes are moist.     Pharynx: Oropharynx is clear. No oropharyngeal exudate or posterior oropharyngeal erythema.  Eyes:     General: Scleral icterus present.         Right eye: No discharge.        Left eye: No discharge.     Extraocular Movements: Extraocular movements intact.  Cardiovascular:     Rate and Rhythm: Normal rate and regular rhythm.     Pulses: Normal pulses.     Heart sounds: Normal heart sounds. No murmur heard.    No friction rub. No gallop.  Pulmonary:     Effort: Pulmonary effort is normal. No respiratory distress.     Breath sounds: Normal breath sounds. No stridor. No wheezing, rhonchi or rales.  Chest:     Chest wall: No tenderness.  Abdominal:     General: There is no distension.     Palpations: Abdomen is soft.     Tenderness: There is no abdominal tenderness. There is no right CVA tenderness, left CVA tenderness or guarding.  Musculoskeletal:        General: No swelling or signs of injury.     Right lower leg: No edema.     Left lower leg: No edema.  Skin:    General: Skin is warm and dry.     Capillary Refill: Capillary refill takes less than 2 seconds.     Coloration: Skin is not jaundiced or pale.     Findings: No bruising, erythema or lesion.  Neurological:     Mental Status: She is alert and oriented to person, place, and time.     Motor: No weakness.     Coordination: Coordination normal.     Gait: Gait normal.  Psychiatric:        Mood and Affect: Mood normal.        Behavior: Behavior normal.        Thought Content: Thought content normal.        Judgment: Judgment normal.     BP (!) 103/59 (BP Location: Right Arm, Patient Position: Sitting, Cuff Size: Large)   Pulse 84   Ht 5' 3 (1.6 m)   Wt 160 lb (72.6 kg)   SpO2 100%   BMI 28.34 kg/m  Wt  Readings from Last 3 Encounters:  08/28/24 160 lb (72.6 kg)  07/31/24 165 lb 5.5 oz (75 kg)  07/20/24 163 lb (73.9 kg)    No results found for: TSH Lab Results  Component Value Date   WBC 13.6 (H) 08/03/2024   HGB 10.8 (L) 08/03/2024   HCT 30.6 (L) 08/03/2024   MCV 86.4 08/03/2024   PLT 473 (H) 08/03/2024   Lab Results  Component Value Date    NA 139 07/31/2024   K 3.4 (L) 07/31/2024   CO2 23 07/31/2024   GLUCOSE 91 07/31/2024   BUN 9 07/31/2024   CREATININE 0.50 07/31/2024   BILITOT 1.2 07/31/2024   ALKPHOS 72 07/31/2024   AST 24 07/31/2024   ALT 16 07/31/2024   PROT 6.9 07/31/2024   ALBUMIN 4.1 07/31/2024   CALCIUM  9.1 07/31/2024   ANIONGAP 11 07/31/2024   EGFR 123 02/24/2024   Lab Results  Component Value Date   CHOL 142 05/28/2024   Lab Results  Component Value Date   HDL 40 05/28/2024   Lab Results  Component Value Date   LDLCALC 87 05/28/2024   Lab Results  Component Value Date   TRIG 78 05/28/2024   Lab Results  Component Value Date   CHOLHDL 3.6 05/28/2024   No results found for: HGBA1C    Assessment & Plan:   Problem List Items Addressed This Visit       Other   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 Advised on lifestyle modifications to prevent exacerbation. - Continue MS Contin  60 mg every 12 hours. - Continue oxycodone  10 mg every 6 hours as needed. Takes cyclobenzaprine  10 mg at bedtime as needed low back pain risk of excessive sedation when taking cyclobenzaprine  with opioids discussed, stated that she does not take medication at the same time with oxycodone , advised to report excessive sedation We discussed the need for good hydration, monitoring of hydration status, avoidance of heat, cold, stress, and infection triggers. We discussed the need to be adherent with taking folic acid  and other home medications. Patient was reminded of the need to seek medical attention immediately if any symptom of bleeding, anemia, or infection occurs.   Encouraged to keep medications in a safe and secure cabinet and reports excessive sedation        Relevant Orders   Sickle Cell Panel   ToxAssure Flex 15, Ur   Vitamin D  deficiency   Last vitamin D  Lab Results  Component Value Date   VD25OH  34.5 05/28/2024  currently managed with weekly supplementation. - Continue vitamin D  50,000 units once a week.      Tobacco use disorder, mild, abuse    Smoking two cigarettes daily. Interested in nicotine  patches. - Prescribe nicotine  patches.      Relevant Medications   nicotine  (NICODERM CQ  - DOSED IN MG/24 HOURS) 14 mg/24hr patch   Marijuana smoker   Cessation encouraged      Severe major depression without psychotic features (HCC)    >>ASSESSMENT AND PLAN FOR SEVERE DEPRESSIVE EPISODE WITHOUT PSYCHOTIC SYMPTOMS (HCC) WRITTEN ON 08/28/2024  7:20 PM BY Jex Strausbaugh R, FNP     08/28/2024   11:12 AM 08/20/2024    1:09 PM 07/20/2024    2:00 PM 05/28/2024   10:20 AM 02/24/2024    9:25 AM  Depression screen PHQ 2/9  Decreased Interest 3  3 0 0  Down,  Depressed, Hopeless 3  3 0 0  PHQ - 2 Score 6  6 0 0  Altered sleeping 3  3 0   Tired, decreased energy 3  3 2    Change in appetite 3  3 0   Feeling bad or failure about yourself  3  3 0   Trouble concentrating 3  3 0   Moving slowly or fidgety/restless 1  3 0   Suicidal thoughts 0  0 0   PHQ-9 Score 22  24 2    Difficult doing work/chores Very difficult  Extremely dIfficult Not difficult at all      Information is confidential and restricted. Go to Review Flowsheets to unlock data.   Major depressive disorder and generalized anxiety disorder Symptoms of depression and anxiety persist. Care established with psychiatrist and therapist. - Increase Prozac  to 40 mg daily. - Continue therapy sessions with therapist and follow up with psychiatrist       Relevant Medications   FLUoxetine  (PROZAC ) 40 MG capsule   Need for influenza vaccination   GAD (generalized anxiety disorder)      08/28/2024   11:14 AM 08/20/2024    1:09 PM 05/28/2024   10:25 AM 02/24/2024    9:25 AM  GAD 7 : Generalized Anxiety Score  Nervous, Anxious, on Edge 3  0 0  Control/stop worrying 3  0 0  Worry too much - different things 3  0 0  Trouble  relaxing 3  3 0  Restless 3  1 0  Easily annoyed or irritable 3  3 0  Afraid - awful might happen 1  0 0  Total GAD 7 Score 19  7 0  Anxiety Difficulty Very difficult  Not difficult at all Not difficult at all     Information is confidential and restricted. Go to Review Flowsheets to unlock data.   Major depressive disorder and generalized anxiety disorder Symptoms of depression and anxiety persist. Care established with psychiatrist and therapist. - Increase Prozac  to 40 mg daily. - Continue therapy sessions with therapist and follow up with psychiatrist        Relevant Medications   FLUoxetine  (PROZAC ) 40 MG capsule    Meds ordered this encounter  Medications   nicotine  (NICODERM CQ  - DOSED IN MG/24 HOURS) 14 mg/24hr patch    Sig: Place 1 patch (14 mg total) onto the skin daily.    Dispense:  42 patch    Refill:  0   FLUoxetine  (PROZAC ) 40 MG capsule    Sig: Take 1 capsule (40 mg total) by mouth daily.    Dispense:  60 capsule    Refill:  3    Follow-up: Return in about 3 months (around 11/28/2024), or SCD.    Sinclair Alligood R Darian Ace, FNP

## 2024-08-29 ENCOUNTER — Ambulatory Visit: Payer: Self-pay | Admitting: Nurse Practitioner

## 2024-08-29 LAB — CMP14+CBC/D/PLT+FER+RETIC+V...
ALT: 12 IU/L (ref 0–32)
AST: 20 IU/L (ref 0–40)
Albumin: 4.2 g/dL (ref 3.9–4.9)
Alkaline Phosphatase: 72 IU/L (ref 41–116)
BUN/Creatinine Ratio: 16 (ref 9–23)
BUN: 10 mg/dL (ref 6–20)
Basophils Absolute: 0 x10E3/uL (ref 0.0–0.2)
Basos: 1 %
Bilirubin Total: 1 mg/dL (ref 0.0–1.2)
CO2: 22 mmol/L (ref 20–29)
Calcium: 9.7 mg/dL (ref 8.7–10.2)
Chloride: 102 mmol/L (ref 96–106)
Creatinine, Ser: 0.61 mg/dL (ref 0.57–1.00)
EOS (ABSOLUTE): 0.1 x10E3/uL (ref 0.0–0.4)
Eos: 1 %
Ferritin: 312 ng/mL — ABNORMAL HIGH (ref 15–150)
Globulin, Total: 3.2 g/dL (ref 1.5–4.5)
Glucose: 91 mg/dL (ref 70–99)
Hematocrit: 30.2 % — ABNORMAL LOW (ref 34.0–46.6)
Hemoglobin: 9.7 g/dL — ABNORMAL LOW (ref 11.1–15.9)
Immature Grans (Abs): 0 x10E3/uL (ref 0.0–0.1)
Immature Granulocytes: 0 %
Lymphocytes Absolute: 3 x10E3/uL (ref 0.7–3.1)
Lymphs: 37 %
MCH: 29.6 pg (ref 26.6–33.0)
MCHC: 32.1 g/dL (ref 31.5–35.7)
MCV: 92 fL (ref 79–97)
Monocytes Absolute: 1.1 x10E3/uL — ABNORMAL HIGH (ref 0.1–0.9)
Monocytes: 13 %
Neutrophils Absolute: 4 x10E3/uL (ref 1.4–7.0)
Neutrophils: 48 %
Platelets: 526 x10E3/uL — ABNORMAL HIGH (ref 150–450)
Potassium: 4.2 mmol/L (ref 3.5–5.2)
RBC: 3.28 x10E6/uL — ABNORMAL LOW (ref 3.77–5.28)
RDW: 14.7 % (ref 11.7–15.4)
Retic Ct Pct: 4.6 % — ABNORMAL HIGH (ref 0.6–2.6)
Sodium: 138 mmol/L (ref 134–144)
Total Protein: 7.4 g/dL (ref 6.0–8.5)
Vit D, 25-Hydroxy: 28.8 ng/mL — ABNORMAL LOW (ref 30.0–100.0)
WBC: 8.3 x10E3/uL (ref 3.4–10.8)
eGFR: 122 mL/min/1.73 (ref >59)

## 2024-08-31 ENCOUNTER — Other Ambulatory Visit: Payer: Self-pay

## 2024-08-31 DIAGNOSIS — Z79891 Long term (current) use of opiate analgesic: Secondary | ICD-10-CM

## 2024-08-31 DIAGNOSIS — D571 Sickle-cell disease without crisis: Secondary | ICD-10-CM

## 2024-08-31 MED ORDER — OXYCODONE HCL 10 MG PO TABS: 10.0000 mg | ORAL_TABLET | Freq: Four times a day (QID) | ORAL | 0 refills | Status: DC | PRN

## 2024-08-31 NOTE — Addendum Note (Signed)
 Addended by: VICTORY IHA on: 08/31/2024 01:51 PM   Modules accepted: Orders

## 2024-08-31 NOTE — Telephone Encounter (Signed)
 Please advise [facility]

## 2024-09-01 ENCOUNTER — Encounter (HOSPITAL_COMMUNITY): Payer: Self-pay

## 2024-09-01 ENCOUNTER — Inpatient Hospital Stay (HOSPITAL_COMMUNITY)
Admission: EM | Admit: 2024-09-01 | Discharge: 2024-09-04 | DRG: 812 | Disposition: A | Discharge status: Home / Self Care | Payer: MEDICAID | Attending: Internal Medicine | Admitting: Internal Medicine

## 2024-09-01 ENCOUNTER — Other Ambulatory Visit: Payer: Self-pay

## 2024-09-01 DIAGNOSIS — G894 Chronic pain syndrome: Secondary | ICD-10-CM | POA: Diagnosis present

## 2024-09-01 DIAGNOSIS — D638 Anemia in other chronic diseases classified elsewhere: Secondary | ICD-10-CM | POA: Diagnosis present

## 2024-09-01 DIAGNOSIS — Z9081 Acquired absence of spleen: Secondary | ICD-10-CM | POA: Diagnosis not present

## 2024-09-01 DIAGNOSIS — F17210 Nicotine dependence, cigarettes, uncomplicated: Secondary | ICD-10-CM | POA: Diagnosis present

## 2024-09-01 DIAGNOSIS — E669 Obesity, unspecified: Secondary | ICD-10-CM | POA: Diagnosis present

## 2024-09-01 DIAGNOSIS — Z8269 Family history of other diseases of the musculoskeletal system and connective tissue: Secondary | ICD-10-CM | POA: Diagnosis not present

## 2024-09-01 DIAGNOSIS — D72829 Elevated white blood cell count, unspecified: Secondary | ICD-10-CM | POA: Diagnosis present

## 2024-09-01 DIAGNOSIS — Z6828 Body mass index (BMI) 28.0-28.9, adult: Secondary | ICD-10-CM

## 2024-09-01 DIAGNOSIS — D5700 Hb-SS disease with crisis, unspecified: Principal | ICD-10-CM | POA: Diagnosis present

## 2024-09-01 DIAGNOSIS — Z832 Family history of diseases of the blood and blood-forming organs and certain disorders involving the immune mechanism: Secondary | ICD-10-CM | POA: Diagnosis not present

## 2024-09-01 DIAGNOSIS — Z8261 Family history of arthritis: Secondary | ICD-10-CM

## 2024-09-01 DIAGNOSIS — Z888 Allergy status to other drugs, medicaments and biological substances status: Secondary | ICD-10-CM | POA: Diagnosis not present

## 2024-09-01 DIAGNOSIS — Z8419 Family history of other disorders of kidney and ureter: Secondary | ICD-10-CM | POA: Diagnosis not present

## 2024-09-01 DIAGNOSIS — F419 Anxiety disorder, unspecified: Secondary | ICD-10-CM | POA: Diagnosis present

## 2024-09-01 DIAGNOSIS — Z8249 Family history of ischemic heart disease and other diseases of the circulatory system: Secondary | ICD-10-CM | POA: Diagnosis not present

## 2024-09-01 DIAGNOSIS — Z79899 Other long term (current) drug therapy: Secondary | ICD-10-CM

## 2024-09-01 DIAGNOSIS — F17200 Nicotine dependence, unspecified, uncomplicated: Secondary | ICD-10-CM | POA: Diagnosis present

## 2024-09-01 LAB — CBC WITH DIFFERENTIAL/PLATELET
Abs Immature Granulocytes: 0.03 K/uL (ref 0.00–0.07)
Basophils Absolute: 0 K/uL (ref 0.0–0.1)
Basophils Relative: 1 %
Eosinophils Absolute: 0.1 K/uL (ref 0.0–0.5)
Eosinophils Relative: 1 %
HCT: 28.3 % — ABNORMAL LOW (ref 36.0–46.0)
Hemoglobin: 9.8 g/dL — ABNORMAL LOW (ref 12.0–15.0)
Immature Granulocytes: 0 %
Lymphocytes Relative: 35 %
Lymphs Abs: 3 K/uL (ref 0.7–4.0)
MCH: 29 pg (ref 26.0–34.0)
MCHC: 34.6 g/dL (ref 30.0–36.0)
MCV: 83.7 fL (ref 80.0–100.0)
Monocytes Absolute: 0.9 K/uL (ref 0.1–1.0)
Monocytes Relative: 11 %
Neutro Abs: 4.6 K/uL (ref 1.7–7.7)
Neutrophils Relative %: 52 %
Platelets: 540 K/uL — ABNORMAL HIGH (ref 150–400)
RBC: 3.38 MIL/uL — ABNORMAL LOW (ref 3.87–5.11)
RDW: 14 % (ref 11.5–15.5)
WBC: 8.7 K/uL (ref 4.0–10.5)
nRBC: 0.3 % — ABNORMAL HIGH (ref 0.0–0.2)

## 2024-09-01 LAB — COMPREHENSIVE METABOLIC PANEL WITH GFR
ALT: 13 U/L (ref 0–44)
AST: 21 U/L (ref 15–41)
Albumin: 4.4 g/dL (ref 3.5–5.0)
Alkaline Phosphatase: 68 U/L (ref 38–126)
Anion gap: 8 (ref 5–15)
BUN: 9 mg/dL (ref 6–20)
CO2: 24 mmol/L (ref 22–32)
Calcium: 9.5 mg/dL (ref 8.9–10.3)
Chloride: 105 mmol/L (ref 98–111)
Creatinine, Ser: 0.57 mg/dL (ref 0.44–1.00)
GFR, Estimated: >60 mL/min (ref >60)
Glucose, Bld: 96 mg/dL (ref 70–99)
Potassium: 3.5 mmol/L (ref 3.5–5.1)
Sodium: 138 mmol/L (ref 135–145)
Total Bilirubin: 1.3 mg/dL — ABNORMAL HIGH (ref 0.0–1.2)
Total Protein: 7.9 g/dL (ref 6.5–8.1)

## 2024-09-01 LAB — HCG, SERUM, QUALITATIVE: Preg, Serum: NEGATIVE

## 2024-09-01 LAB — CBC
HCT: 27.2 % — ABNORMAL LOW (ref 36.0–46.0)
Hemoglobin: 9.7 g/dL — ABNORMAL LOW (ref 12.0–15.0)
MCH: 30.1 pg (ref 26.0–34.0)
MCHC: 35.7 g/dL (ref 30.0–36.0)
MCV: 84.5 fL (ref 80.0–100.0)
Platelets: 471 K/uL — ABNORMAL HIGH (ref 150–400)
RBC: 3.22 MIL/uL — ABNORMAL LOW (ref 3.87–5.11)
RDW: 13.9 % (ref 11.5–15.5)
WBC: 9.7 K/uL (ref 4.0–10.5)
nRBC: 0.4 % — ABNORMAL HIGH (ref 0.0–0.2)

## 2024-09-01 LAB — RETICULOCYTES
Immature Retic Fract: 38.5 % — ABNORMAL HIGH (ref 2.3–15.9)
RBC.: 3.28 MIL/uL — ABNORMAL LOW (ref 3.87–5.11)
Retic Count, Absolute: 195.8 K/uL — ABNORMAL HIGH (ref 19.0–186.0)
Retic Ct Pct: 6 % — ABNORMAL HIGH (ref 0.4–3.1)

## 2024-09-01 LAB — CREATININE, SERUM
Creatinine, Ser: 0.57 mg/dL (ref 0.44–1.00)
GFR, Estimated: >60 mL/min (ref >60)

## 2024-09-01 MED ORDER — KETOROLAC TROMETHAMINE 15 MG/ML IJ SOLN
15.0000 mg | Freq: Four times a day (QID) | INTRAMUSCULAR | Status: DC
  Administered 2024-09-01 – 2024-09-04 (×10): 15 mg via INTRAVENOUS
  Filled 2024-09-01 (×10): qty 1

## 2024-09-01 MED ORDER — KETOROLAC TROMETHAMINE 15 MG/ML IJ SOLN
15.0000 mg | Freq: Four times a day (QID) | INTRAMUSCULAR | Status: DC
  Administered 2024-09-01: 15 mg via INTRAVENOUS
  Filled 2024-09-01: qty 1

## 2024-09-01 MED ORDER — SENNOSIDES-DOCUSATE SODIUM 8.6-50 MG PO TABS: 1.0000 | ORAL_TABLET | Freq: Two times a day (BID) | ORAL | Status: DC

## 2024-09-01 MED ORDER — HYDROMORPHONE HCL 2 MG/ML IJ SOLN
2.0000 mg | INTRAMUSCULAR | Status: AC | PRN
  Administered 2024-09-01 – 2024-09-02 (×2): 2 mg via INTRAVENOUS
  Filled 2024-09-01 (×3): qty 1

## 2024-09-01 MED ORDER — MORPHINE SULFATE ER 15 MG PO TBCR
60.0000 mg | EXTENDED_RELEASE_TABLET | Freq: Two times a day (BID) | ORAL | Status: DC
  Administered 2024-09-01 – 2024-09-04 (×6): 60 mg via ORAL
  Filled 2024-09-01 (×6): qty 4

## 2024-09-01 MED ORDER — SODIUM CHLORIDE 0.45 % IV SOLN: INTRAVENOUS | Status: DC

## 2024-09-01 MED ORDER — HYDROMORPHONE HCL 1 MG/ML IJ SOLN
1.0000 mg | Freq: Once | INTRAMUSCULAR | Status: AC
  Administered 2024-09-01: 1 mg via SUBCUTANEOUS
  Filled 2024-09-01: qty 1

## 2024-09-01 MED ORDER — OXYCODONE HCL 5 MG PO TABS
10.0000 mg | ORAL_TABLET | Freq: Four times a day (QID) | ORAL | Status: DC | PRN
  Administered 2024-09-02 – 2024-09-04 (×4): 10 mg via ORAL
  Filled 2024-09-01 (×4): qty 2

## 2024-09-01 MED ORDER — SODIUM CHLORIDE 0.9% FLUSH: 9.0000 mL | INTRAVENOUS | Status: DC | PRN

## 2024-09-01 MED ORDER — POLYETHYLENE GLYCOL 3350 17 G PO PACK: 17.0000 g | PACK | Freq: Every day | ORAL | Status: DC | PRN

## 2024-09-01 MED ORDER — ENOXAPARIN SODIUM 40 MG/0.4ML IJ SOSY: 40.0000 mg | PREFILLED_SYRINGE | INTRAMUSCULAR | Status: DC

## 2024-09-01 MED ORDER — SENNOSIDES-DOCUSATE SODIUM 8.6-50 MG PO TABS
1.0000 | ORAL_TABLET | Freq: Two times a day (BID) | ORAL | Status: DC
  Administered 2024-09-01 – 2024-09-04 (×6): 1 via ORAL
  Filled 2024-09-01 (×6): qty 1

## 2024-09-01 MED ORDER — FLUOXETINE HCL 20 MG PO CAPS
40.0000 mg | ORAL_CAPSULE | Freq: Every day | ORAL | Status: DC
  Administered 2024-09-01 – 2024-09-04 (×4): 40 mg via ORAL
  Filled 2024-09-01 (×4): qty 2

## 2024-09-01 MED ORDER — HYDROMORPHONE 1 MG/ML IV SOLN
INTRAVENOUS | Status: DC
  Administered 2024-09-01: 6 mg via INTRAVENOUS
  Administered 2024-09-01: 30 mg via INTRAVENOUS
  Administered 2024-09-02: 6 mg via INTRAVENOUS
  Administered 2024-09-02: 3 mg via INTRAVENOUS
  Administered 2024-09-02: 1.5 mg via INTRAVENOUS
  Administered 2024-09-02: 2 mg via INTRAVENOUS
  Administered 2024-09-02: 3 mg via INTRAVENOUS
  Administered 2024-09-02: 5.5 mg via INTRAVENOUS
  Administered 2024-09-03 (×2): 30 mg via INTRAVENOUS
  Administered 2024-09-03: 9 mg via INTRAVENOUS
  Administered 2024-09-03: 2.5 mg via INTRAVENOUS
  Administered 2024-09-04: 8 mg via INTRAVENOUS
  Administered 2024-09-04: 7.5 mg via INTRAVENOUS
  Filled 2024-09-01 (×3): qty 30

## 2024-09-01 MED ORDER — ENOXAPARIN SODIUM 40 MG/0.4ML IJ SOSY
40.0000 mg | PREFILLED_SYRINGE | INTRAMUSCULAR | Status: DC
  Filled 2024-09-01: qty 0.4

## 2024-09-01 MED ORDER — FOLIC ACID 1 MG PO TABS
1.0000 mg | ORAL_TABLET | Freq: Every day | ORAL | Status: DC
  Administered 2024-09-01 – 2024-09-04 (×4): 1 mg via ORAL
  Filled 2024-09-01 (×4): qty 1

## 2024-09-01 MED ORDER — CYCLOBENZAPRINE HCL 10 MG PO TABS: 10.0000 mg | ORAL_TABLET | Freq: Every day | ORAL | Status: DC | PRN

## 2024-09-01 MED ORDER — SODIUM CHLORIDE 0.45 % IV SOLN: INTRAVENOUS | Status: AC

## 2024-09-01 MED ORDER — HYDROMORPHONE HCL 1 MG/ML IJ SOLN
2.0000 mg | INTRAMUSCULAR | Status: AC
  Administered 2024-09-01: 2 mg via INTRAVENOUS
  Filled 2024-09-01: qty 2

## 2024-09-01 MED ORDER — NALOXONE HCL 0.4 MG/ML IJ SOLN: 0.4000 mg | INTRAMUSCULAR | Status: DC | PRN

## 2024-09-01 MED ORDER — DIPHENHYDRAMINE HCL 25 MG PO CAPS: 25.0000 mg | ORAL_CAPSULE | ORAL | Status: DC | PRN

## 2024-09-01 MED ORDER — ONDANSETRON HCL 4 MG/2ML IJ SOLN
4.0000 mg | INTRAMUSCULAR | Status: DC | PRN
  Administered 2024-09-01: 4 mg via INTRAVENOUS
  Filled 2024-09-01: qty 2

## 2024-09-01 MED ORDER — SODIUM CHLORIDE 0.9 % IV SOLN
12.5000 mg | Freq: Once | INTRAVENOUS | Status: AC
  Administered 2024-09-01: 12.5 mg via INTRAVENOUS
  Filled 2024-09-01: qty 12.5

## 2024-09-01 NOTE — ED Provider Notes (Signed)
 [place] AT [facility] Provider Note   CSN: [id number] Arrival date & time: 09/01/24  1114     Patient presents with: Sickle Cell Pain Crisis   Carmen [name] is a 33 y.o. female.   33 year old female well-known to me with history of sickle cell disease presents with her usual pain crisis.  States her pain has been going on for past 3 days.  Relates this to change in weather as her increased chest.  Unrelieved with her home oxycodone .  Did call to consult clinic 2 days ago but they state they were full.  Denies any chest pain, fever, urinary symptoms.       Prior to Admission medications   Medication Sig Start Date End Date Taking? Authorizing Provider  cyclobenzaprine  (FLEXERIL ) 10 MG tablet TAKE 1 TABLET BY MOUTH DAILY AT BEDTIME AS NEEDED FOR MUSCLE SPASMS Patient taking differently: Take 10 mg by mouth daily as needed for muscle spasms. 07/30/24   Paseda, Folashade R, FNP  FLUoxetine  (PROZAC ) 40 MG capsule Take 1 capsule (40 mg total) by mouth daily. 08/28/24   Paseda, Folashade R, FNP  folic acid  (FOLVITE ) 1 MG tablet Take 1 tablet (1 mg total) by mouth daily. 12/27/23   Paseda, Folashade R, FNP  morphine  (MS CONTIN ) 60 MG 12 hr tablet Take 1 tablet (60 mg total) by mouth every 12 (twelve) hours. 08/07/24   Paseda, Folashade R, FNP  naloxone  (NARCAN ) nasal spray 4 mg/0.1 mL 1 nasal spray in one nostril as needed for opoid overdose 05/28/24   Paseda, Folashade R, FNP  nicotine  (NICODERM CQ  - DOSED IN MG/24 HOURS) 14 mg/24hr patch Place 1 patch (14 mg total) onto the skin daily. 08/28/24   Paseda, Folashade R, FNP  Oxycodone  HCl 10 MG TABS Take 1 tablet (10 mg total) by mouth every 6 (six) hours as needed (pain). 09/05/24   Paseda, Folashade R, FNP  Vitamin D , Ergocalciferol , (DRISDOL ) 1.25 MG (50000 UNIT) CAPS capsule TAKE 1 CAPSULE BY MOUTH ONE TIME PER WEEK 12/27/23   Paseda, Folashade R, FNP    Allergies: Ketamine     Review of Systems  All other  systems reviewed and are negative.   Updated Vital Signs BP 118/88 (BP Location: Right Arm)   Pulse 98   Temp 98.9 F (37.2 C) (Oral)   Resp 18   Ht 1.6 m (5' 3)   Wt 72.6 kg   SpO2 95%   BMI 28.34 kg/m   Physical Exam Vitals and nursing note reviewed.  Constitutional:      General: She is not in acute distress.    Appearance: Normal appearance. She is well-developed. She is not toxic-appearing.  HENT:     Head: Normocephalic and atraumatic.  Eyes:     General: Lids are normal.     Conjunctiva/sclera: Conjunctivae normal.     Pupils: Pupils are equal, round, and reactive to light.  Neck:     Thyroid: No thyroid mass.     Trachea: No tracheal deviation.  Cardiovascular:     Rate and Rhythm: Normal rate and regular rhythm.     Heart sounds: Normal heart sounds. No murmur heard.    No gallop.  Pulmonary:     Effort: Pulmonary effort is normal. No respiratory distress.     Breath sounds: Normal breath sounds. No stridor. No decreased breath sounds, wheezing, rhonchi or rales.  Abdominal:     General: There is no distension.     Palpations: Abdomen  is soft.     Tenderness: There is no abdominal tenderness. There is no rebound.  Musculoskeletal:        General: No tenderness. Normal range of motion.     Cervical back: Normal range of motion and neck supple.  Skin:    General: Skin is warm and dry.     Findings: No abrasion or rash.  Neurological:     Mental Status: She is alert and oriented to person, place, and time. Mental status is at baseline.     GCS: GCS eye subscore is 4. GCS verbal subscore is 5. GCS motor subscore is 6.     Cranial Nerves: No cranial nerve deficit.     Sensory: No sensory deficit.     Motor: Motor function is intact.  Psychiatric:        Attention and Perception: Attention normal.        Speech: Speech normal.        Behavior: Behavior normal.     (all labs ordered are listed, but only abnormal results are displayed) Labs Reviewed   COMPREHENSIVE METABOLIC PANEL WITH GFR  CBC WITH DIFFERENTIAL/PLATELET  RETICULOCYTES  HCG, SERUM, QUALITATIVE    EKG: None  Radiology: No results found.   Procedures   Medications Ordered in the ED  HYDROmorphone  (DILAUDID ) injection 1 mg (has no administration in time range)  0.45 % sodium chloride  infusion (has no administration in time range)  HYDROmorphone  (DILAUDID ) injection 2 mg (has no administration in time range)  HYDROmorphone  (DILAUDID ) injection 2 mg (has no administration in time range)  HYDROmorphone  (DILAUDID ) injection 2 mg (has no administration in time range)  diphenhydrAMINE  (BENADRYL ) 12.5 mg in sodium chloride  0.9 % 50 mL IVPB (has no administration in time range)  ondansetron  (ZOFRAN ) injection 4 mg (has no administration in time range)                                    Medical Decision Making Amount and/or Complexity of Data Reviewed Labs: ordered.  Risk Prescription drug management.   Patient medicated IV fluids as well as IV hydromorphone .  Patient's hemoglobin is stable.  Reticulocyte count is up.  Patient still remains in will require admission.  Will consult sickle cell team     Final diagnoses:  None    ED Discharge Orders     None          Dasie Faden, MD 09/01/24 1439

## 2024-09-01 NOTE — Plan of Care (Signed)
  Problem: Elimination: Goal: Will not experience complications related to bowel motility Outcome: Progressing   Problem: Safety: Goal: Ability to remain free from injury will improve Outcome: Progressing   Problem: Skin Integrity: Goal: Risk for impaired skin integrity will decrease Outcome: Progressing   Problem: Education: Goal: Awareness of infection prevention will improve Outcome: Progressing Goal: Awareness of signs and symptoms of anemia will improve Outcome: Progressing

## 2024-09-01 NOTE — Plan of Care (Signed)
  Problem: Education: Goal: Knowledge of General Education information will improve Description: Including pain rating scale, medication(s)/side effects and non-pharmacologic comfort measures Outcome: Progressing   Problem: Clinical Measurements: Goal: Will remain free from infection Outcome: Progressing Goal: Cardiovascular complication will be avoided Outcome: Progressing   Problem: Health Behavior/Discharge Planning: Goal: Ability to manage health-related needs will improve Outcome: Not Progressing   Problem: Clinical Measurements: Goal: Ability to maintain clinical measurements within normal limits will improve Outcome: Not Progressing Goal: Diagnostic test results will improve Outcome: Not Progressing Goal: Respiratory complications will improve Outcome: Not Progressing

## 2024-09-01 NOTE — ED Triage Notes (Signed)
 Patient has a sickle cell pain crisis that began 3 days ago. Pain in both arms and legs. Took 10mg  oxycodone  at 3am.

## 2024-09-01 NOTE — Plan of Care (Signed)
  Problem: Clinical Measurements: Goal: Will remain free from infection Outcome: Progressing Goal: Diagnostic test results will improve Outcome: Progressing Goal: Respiratory complications will improve Outcome: Progressing Goal: Cardiovascular complication will be avoided Outcome: Progressing   Problem: Activity: Goal: Risk for activity intolerance will decrease Outcome: Progressing   Problem: Nutrition: Goal: Adequate nutrition will be maintained Outcome: Progressing   Problem: Coping: Goal: Level of anxiety will decrease Outcome: Progressing   Problem: Elimination: Goal: Will not experience complications related to bowel motility Outcome: Progressing Goal: Will not experience complications related to urinary retention Outcome: Progressing   Problem: Safety: Goal: Ability to remain free from injury will improve Outcome: Progressing   Problem: Skin Integrity: Goal: Risk for impaired skin integrity will decrease Outcome: Progressing

## 2024-09-01 NOTE — H&P (Signed)
 History and Physical    Patient: Carmen [name] FMW:[id number] DOB: [birth year] DOA: 09/01/2024 DOS: the patient was seen and examined on 09/01/2024 PCP: Paseda, Folashade R, FNP  Patient coming from: Home  Chief Complaint:  Chief Complaint  Patient presents with   Sickle Cell Pain Crisis   HPI: Carmen [name] is a 33 y.o. female with medical history significant of sickle cell disease, chronic pain syndrome, sickle cell anemia, who presents to the emergency room complaining of 10 out of 10 pain in her back and legs consistent with her typical sickle cell crisis.  Patient denied any nausea or vomiting.  She is taking her home regimen apparently but no relief.  Patient was seen and evaluated in the ER.  She reviewed up to 6 mg of Dilaudid  IV in the ER with no relief.  Patient is therefore being admitted to the hospital for pain management.  Review of Systems: As mentioned in the history of present illness. All other systems reviewed and are negative. Past Medical History:  Diagnosis Date   Anxiety    Headache(784.0)    Heart murmur    Sickle cell crisis (HCC)    Syphilis 2015   Was diagnosed and received one injection of antibiotics   Thrombocytosis 11/22/2014     CBC Latest Ref Rng & Units 12/13/2018 12/11/2018 12/10/2018 WBC 4.0 - 10.5 K/uL 14.8(H) 13.2(H) 15.9(H) Hemoglobin 12.0 - 15.0 g/dL 8.2(L) 7.7(L) 8.1(L) Hematocrit 36.0 - 46.0 % 23.7(L) 23.2(L) 24.5(L) Platelets 150 - 400 K/uL 326 399 388     Past Surgical History:  Procedure Laterality Date   CESAREAN SECTION N/A 02/05/2019   Procedure: CESAREAN SECTION;  Surgeon: Lorence Ozell CROME, MD;  Location: [facility]S;  Service: Obstetrics;  Laterality: N/A;   CHOLECYSTECTOMY N/A 11/30/2014   Procedure: LAPAROSCOPIC CHOLECYSTECTOMY SINGLE SITE WITH INTRAOPERATIVE CHOLANGIOGRAM;  Surgeon: Elspeth Schultze, MD;  Location: [facility] ORS;  Service: General;  Laterality: N/A;   SPLENECTOMY     Social History:  reports that she has been smoking cigarettes. She  has never used smokeless tobacco. She reports current drug use. Drug: Marijuana. She reports that she does not drink alcohol.  Allergies  Allergen Reactions   Ketamine  Hives, Nausea And Vomiting and Other (See Comments)    Makes the patient feel funny also    Family History  Problem Relation Age of Onset   Hypertension Mother    Sickle cell anemia Sister    Kidney disease Sister        Lupus   Arthritis Sister    Sickle cell anemia Sister    Sickle cell trait Sister    Heart disease Maternal Aunt        CABG   Heart disease Maternal Uncle        CABG   Lupus Sister     Prior to Admission medications   Medication Sig Start Date End Date Taking? Authorizing Provider  cyclobenzaprine  (FLEXERIL ) 10 MG tablet TAKE 1 TABLET BY MOUTH DAILY AT BEDTIME AS NEEDED FOR MUSCLE SPASMS Patient taking differently: Take 10 mg by mouth daily as needed for muscle spasms. 07/30/24  Yes Paseda, Folashade R, FNP  FLUoxetine  (PROZAC ) 40 MG capsule Take 1 capsule (40 mg total) by mouth daily. 08/28/24  Yes Paseda, Folashade R, FNP  folic acid  (FOLVITE ) 1 MG tablet Take 1 tablet (1 mg total) by mouth daily. 12/27/23  Yes Paseda, Folashade R, FNP  morphine  (MS CONTIN ) 60 MG 12 hr tablet Take 1 tablet (60  mg total) by mouth every 12 (twelve) hours. 08/07/24  Yes Paseda, Folashade R, FNP  naloxone  (NARCAN ) nasal spray 4 mg/0.1 mL 1 nasal spray in one nostril as needed for opoid overdose 05/28/24  Yes Paseda, Folashade R, FNP  Oxycodone  HCl 10 MG TABS Take 1 tablet (10 mg total) by mouth every 6 (six) hours as needed (pain). 09/05/24  Yes Paseda, Folashade R, FNP  Vitamin D , Ergocalciferol , (DRISDOL ) 1.25 MG (50000 UNIT) CAPS capsule TAKE 1 CAPSULE BY MOUTH ONE TIME PER WEEK Patient taking differently: Take 50,000 Units by mouth every 7 (seven) days. Every Wednesday 12/27/23  Yes Paseda, Folashade R, FNP  nicotine  (NICODERM CQ  - DOSED IN MG/24 HOURS) 14 mg/24hr patch Place 1 patch (14 mg total) onto the skin  daily. Patient not taking: Reported on 09/01/2024 08/28/24   Paseda, Folashade R, FNP    Physical Exam: Vitals:   09/01/24 1119 09/01/24 1608 09/01/24 1609  BP: 118/88 113/77   Pulse: 98 63   Resp: 18 20   Temp: 98.9 F (37.2 C) 99.2 F (37.3 C)   TempSrc: Oral Oral   SpO2: 95% 99%   Weight: 72.6 kg  72.2 kg  Height: 5' 3 (1.6 m)  5' 3 (1.6 m)   Constitutional: NAD, calm, comfortable Eyes: PERRL, lids and conjunctivae normal ENMT: Mucous membranes are moist. Posterior pharynx clear of any exudate or lesions.Normal dentition.  Neck: normal, supple, no masses, no thyromegaly Respiratory: clear to auscultation bilaterally, no wheezing, no crackles. Normal respiratory effort. No accessory muscle use.  Cardiovascular: Regular rate and rhythm, no murmurs / rubs / gallops. No extremity edema. 2+ pedal pulses. No carotid bruits.  Abdomen: no tenderness, no masses palpated. No hepatosplenomegaly. Bowel sounds positive.  Musculoskeletal: Good range of motion, no joint swelling or tenderness, Skin: no rashes, lesions, ulcers. No induration Neurologic: CN 2-12 grossly intact. Sensation intact, DTR normal. Strength 5/5 in all 4.  Psychiatric: Normal judgment and insight. Alert and oriented x 3. Normal mood  Data Reviewed:  Temperature 99.2, white count 9.7 hemoglobin 9.7 platelets 471.  Reticulocyte count is 6.1.  Assessment and Plan:  #1 sickle cell pain crisis, patient will be admitted.  Initiate Dilaudid  PCA, Toradol , IV fluids.  Half-normal saline at 125 cc an hour.  Continue other supportive care.  #2 anemia of chronic disease: H&H appears stable.  No new issues  #3 chronic pain syndrome: Continue chronic and active medications.  #4 leukocytosis: Secondary to vaso-occlusive crisis.  Will continue with monitoring    Advance Care Planning:   Code Status: Full Code   Consults: None  Family Communication: No family at bedside  Severity of Illness: The appropriate patient  status for this patient is INPATIENT. Inpatient status is judged to be reasonable and necessary in order to provide the required intensity of service to ensure the patient's safety. The patient's presenting symptoms, physical exam findings, and initial radiographic and laboratory data in the context of their chronic comorbidities is felt to place them at high risk for further clinical deterioration. Furthermore, it is not anticipated that the patient will be medically stable for discharge from the hospital within 2 midnights of admission.   * I certify that at the point of admission it is my clinical judgment that the patient will require inpatient hospital care spanning beyond 2 midnights from the point of admission due to high intensity of service, high risk for further deterioration and high frequency of surveillance required.*  AuthorBETHA SIM KNOLL, MD 09/01/2024 5:50 PM  For on call review [email or web address].

## 2024-09-02 ENCOUNTER — Inpatient Hospital Stay (HOSPITAL_COMMUNITY): Payer: MEDICAID

## 2024-09-02 ENCOUNTER — Encounter (HOSPITAL_COMMUNITY): Payer: Self-pay | Admitting: Internal Medicine

## 2024-09-02 DIAGNOSIS — D5700 Hb-SS disease with crisis, unspecified: Secondary | ICD-10-CM | POA: Diagnosis not present

## 2024-09-02 LAB — CBC WITH DIFFERENTIAL/PLATELET
Abs Immature Granulocytes: 0.18 K/uL — ABNORMAL HIGH (ref 0.00–0.07)
Basophils Absolute: 0.1 K/uL (ref 0.0–0.1)
Basophils Relative: 1 %
Eosinophils Absolute: 0.2 K/uL (ref 0.0–0.5)
Eosinophils Relative: 2 %
HCT: 27.2 % — ABNORMAL LOW (ref 36.0–46.0)
Hemoglobin: 9.2 g/dL — ABNORMAL LOW (ref 12.0–15.0)
Immature Granulocytes: 2 %
Lymphocytes Relative: 32 %
Lymphs Abs: 2.9 K/uL (ref 0.7–4.0)
MCH: 29.6 pg (ref 26.0–34.0)
MCHC: 33.8 g/dL (ref 30.0–36.0)
MCV: 87.5 fL (ref 80.0–100.0)
Monocytes Absolute: 1 K/uL (ref 0.1–1.0)
Monocytes Relative: 11 %
Neutro Abs: 4.6 K/uL (ref 1.7–7.7)
Neutrophils Relative %: 52 %
Platelets: 329 K/uL (ref 150–400)
RBC: 3.11 MIL/uL — ABNORMAL LOW (ref 3.87–5.11)
RDW: 15 % (ref 11.5–15.5)
WBC: 8.9 K/uL (ref 4.0–10.5)
nRBC: 0.8 % — ABNORMAL HIGH (ref 0.0–0.2)

## 2024-09-02 LAB — COMPREHENSIVE METABOLIC PANEL WITH GFR
ALT: 29 U/L (ref 0–44)
AST: 65 U/L — ABNORMAL HIGH (ref 15–41)
Albumin: 4 g/dL (ref 3.5–5.0)
Alkaline Phosphatase: 68 U/L (ref 38–126)
Anion gap: 9 (ref 5–15)
BUN: 6 mg/dL (ref 6–20)
CO2: 23 mmol/L (ref 22–32)
Calcium: 8.7 mg/dL — ABNORMAL LOW (ref 8.9–10.3)
Chloride: 104 mmol/L (ref 98–111)
Creatinine, Ser: 0.47 mg/dL (ref 0.44–1.00)
GFR, Estimated: >60 mL/min (ref >60)
Glucose, Bld: 102 mg/dL — ABNORMAL HIGH (ref 70–99)
Potassium: 3.7 mmol/L (ref 3.5–5.1)
Sodium: 136 mmol/L (ref 135–145)
Total Bilirubin: 1.2 mg/dL (ref 0.0–1.2)
Total Protein: 6.9 g/dL (ref 6.5–8.1)

## 2024-09-02 MED ORDER — IOHEXOL 350 MG/ML SOLN
75.0000 mL | Freq: Once | INTRAVENOUS | Status: AC | PRN
  Administered 2024-09-02: 75 mL via INTRAVENOUS

## 2024-09-02 NOTE — Plan of Care (Signed)
   Problem: Education: Goal: Knowledge of General Education information will improve Description: Including pain rating scale, medication(s)/side effects and non-pharmacologic comfort measures Outcome: Progressing   Problem: Health Behavior/Discharge Planning: Goal: Ability to manage health-related needs will improve Outcome: Progressing   Problem: Clinical Measurements: Goal: Ability to maintain clinical measurements within normal limits will improve Outcome: Progressing   Problem: Activity: Goal: Risk for activity intolerance will decrease Outcome: Progressing   Problem: Coping: Goal: Level of anxiety will decrease Outcome: Progressing   Problem: Safety: Goal: Ability to remain free from injury will improve Outcome: Progressing

## 2024-09-02 NOTE — Progress Notes (Signed)
 SICKLE CELL SERVICE PROGRESS NOTE  [name] FMW:[id number] DOB: [birth year] DOA: 09/01/2024 PCP: Paseda, Folashade R, FNP  Assessment/Plan: Active Problems:   Sickle cell pain crisis (HCC)  Sickle cell pain crisis: Patient is still complaining of pain especially on her chest.  On Dilaudid  PCA, Toradol  and IV fluids.  We will continue current treatment.  Patient will get CTA to rule out PE.  Continue chronic pain management. Anemia of chronic disease: Continue to monitor H&H. Chronic pain syndrome: Continue chronic pain management. Chest pain: Will check CT angiogram of the chest to rule out PE  Code Status: Full code Family Communication: No family at bedside Disposition Plan: Home when ready  [facility]  Pager [phone]. If 7PM-7AM, please contact night-coverage.  09/02/2024, 9:24 PM  LOS: 1 day   Brief narrative: Carmen [name] is a 33 y.o. female with medical history significant of sickle cell disease, chronic pain syndrome, sickle cell anemia, who presents to the emergency room complaining of 10 out of 10 pain in her back and legs consistent with her typical sickle cell crisis.  Patient denied any nausea or vomiting.  She is taking her home regimen apparently but no relief.  Patient was seen and evaluated in the ER.  She reviewed up to 6 mg of Dilaudid  IV in the ER with no relief.  Patient is therefore being admitted to the hospital for pain management.   Consultants: None  Procedures: CT angiogram of the chest  Antibiotics: None  HPI/Subjective: Patient's pain is 10 out of 10.  Patient has been complaining of significant chest pain.  No fever or chills.  Objective: Vitals:   09/02/24 1412 09/02/24 1519 09/02/24 1936 09/02/24 2029  BP: 114/78   113/74  Pulse: 61   (!) 56  Resp: 14 16 15 14   Temp: 98.7 F (37.1 C)   99.1 F (37.3 C)  TempSrc: Oral   Oral  SpO2: 100%   97%  Weight:      Height:       Weight change:   Intake/Output Summary (Last 24 hours)  at 09/02/2024 2124 Last data filed at 09/02/2024 1831 Gross per 24 hour  Intake 2614.59 ml  Output --  Net 2614.59 ml    General: Alert, awake, oriented x3, in no acute distress.  HEENT: [place]/AT PEERL, EOMI Neck: Trachea midline,  no masses, no thyromegal,y no JVD, no carotid bruit OROPHARYNX:  Moist, No exudate/ erythema/lesions.  Heart: Regular rate and rhythm, without murmurs, rubs, gallops, PMI non-displaced, no heaves or thrills on palpation.  Lungs: Clear to auscultation, no wheezing or rhonchi noted. No increased vocal fremitus resonant to percussion  Abdomen: Soft, nontender, nondistended, positive bowel sounds, no masses no hepatosplenomegaly noted..  Neuro: No focal neurological deficits noted cranial nerves II through XII grossly intact. DTRs 2+ bilaterally upper and lower extremities. Strength 5 out of 5 in bilateral upper and lower extremities. Musculoskeletal: No warm swelling or erythema around joints, no spinal tenderness noted. Psychiatric: Patient alert and oriented x3, good insight and cognition, good recent to remote recall. Lymph node survey: No cervical axillary or inguinal lymphadenopathy noted.   Data Reviewed: Basic Metabolic Panel: Recent Labs  Lab 08/28/24 1127 09/01/24 1623 09/01/24 1743 09/02/24 0837  NA 138 138  --  136  K 4.2 3.5  --  3.7  CL 102 105  --  104  CO2 22 24  --  23  GLUCOSE 91 96  --  102*  BUN 10 9  --  6  CREATININE 0.61 0.57 0.57 0.47  CALCIUM  9.7 9.5  --  8.7*   Liver Function Tests: Recent Labs  Lab 08/28/24 1127 09/01/24 1623 09/02/24 0837  AST 20 21 65*  ALT 12 13 29   ALKPHOS 72 68 68  BILITOT 1.0 1.3* 1.2  PROT 7.4 7.9 6.9  ALBUMIN 4.2 4.4 4.0   No results for input(s): LIPASE, AMYLASE in the last 168 hours. No results for input(s): AMMONIA in the last 168 hours. CBC: Recent Labs  Lab 08/28/24 1127 09/01/24 1127 09/01/24 1622 09/02/24 0837  WBC 8.3 8.7 9.7 8.9  NEUTROABS 4.0 4.6  --  4.6  HGB 9.7* 9.8*  9.7* 9.2*  HCT 30.2* 28.3* 27.2* 27.2*  MCV 92 83.7 84.5 87.5  PLT 526* 540* 471* 329   Cardiac Enzymes: No results for input(s): CKTOTAL, CKMB, CKMBINDEX, TROPONINI in the last 168 hours. BNP (last 3 results) No results for input(s): BNP in the last 8760 hours.  ProBNP (last 3 results) No results for input(s): PROBNP in the last 8760 hours.  CBG: No results for input(s): GLUCAP in the last 168 hours.  No results found for this or any previous visit (from the past 240 hours).   Studies: CT Angio Chest Pulmonary Embolism (PE) W or WO Contrast Result Date: 09/02/2024 CLINICAL DATA:  Pulmonary embolism (PE) suspected, low to intermediate prob, neg D-dimer, chest pain EXAM: CT ANGIOGRAPHY CHEST WITH CONTRAST TECHNIQUE: Multidetector CT imaging of the chest was performed using the standard protocol during bolus administration of intravenous contrast. Multiplanar CT image reconstructions and MIPs were obtained to evaluate the vascular anatomy. RADIATION DOSE REDUCTION: This exam was performed according to the departmental dose-optimization program which includes automated exposure control, adjustment of the mA and/or kV according to patient size and/or use of iterative reconstruction technique. CONTRAST:  75mL OMNIPAQUE  IOHEXOL  350 MG/ML SOLN COMPARISON:  09/05/2021, 07/31/2024 FINDINGS: Cardiovascular: This is a technically adequate evaluation of the pulmonary vasculature. No filling defects or pulmonary emboli. The heart is unremarkable without pericardial effusion. No evidence of thoracic aortic aneurysm or dissection. Mediastinum/Nodes: No enlarged mediastinal, hilar, or axillary lymph nodes. Thyroid gland, trachea, and esophagus demonstrate no significant findings. Stable soft tissue density within the anterior mediastinum likely reflects thymic tissue. Lungs/Pleura: No acute airspace disease, effusion, or pneumothorax. Minimal hypoventilatory changes at the lung bases. The central  airways are patent. Upper Abdomen: No acute abnormality. Musculoskeletal: No acute or destructive bony abnormalities. Chronic bony changes in the thoracic spine compatible with history of sickle cell disease. Reconstructed images demonstrate no additional findings. Review of the MIP images confirms the above findings. IMPRESSION: 1. No evidence of pulmonary embolus. 2. No acute intrathoracic process. Electronically Signed   By: Ozell Daring M.D.   On: 09/02/2024 16:45    Scheduled Meds:  enoxaparin  (LOVENOX ) injection  40 mg Subcutaneous Q24H   FLUoxetine   40 mg Oral Daily   folic acid   1 mg Oral Daily   HYDROmorphone    Intravenous Q4H   ketorolac   15 mg Intravenous Q6H   morphine   60 mg Oral Q12H   senna-docusate  1 tablet Oral BID   Continuous Infusions:  Active Problems:   Sickle cell pain crisis (HCC)

## 2024-09-02 NOTE — Plan of Care (Signed)
  Problem: Education: Goal: Knowledge of General Education information will improve Description: Including pain rating scale, medication(s)/side effects and non-pharmacologic comfort measures Outcome: Progressing   Problem: Health Behavior/Discharge Planning: Goal: Ability to manage health-related needs will improve Outcome: Progressing   Problem: Clinical Measurements: Goal: Will remain free from infection Outcome: Progressing Goal: Cardiovascular complication will be avoided Outcome: Progressing   Problem: Clinical Measurements: Goal: Ability to maintain clinical measurements within normal limits will improve Outcome: Not Progressing Goal: Diagnostic test results will improve Outcome: Not Progressing Goal: Respiratory complications will improve Outcome: Not Progressing   Problem: Activity: Goal: Risk for activity intolerance will decrease Outcome: Not Progressing

## 2024-09-03 NOTE — Plan of Care (Signed)
  Problem: Education: Goal: Knowledge of General Education information will improve Description: Including pain rating scale, medication(s)/side effects and non-pharmacologic comfort measures Outcome: Progressing   Problem: Health Behavior/Discharge Planning: Goal: Ability to manage health-related needs will improve Outcome: Progressing   Problem: Clinical Measurements: Goal: Ability to maintain clinical measurements within normal limits will improve Outcome: Progressing Goal: Will remain free from infection Outcome: Progressing Goal: Diagnostic test results will improve Outcome: Progressing Goal: Respiratory complications will improve Outcome: Progressing Goal: Cardiovascular complication will be avoided Outcome: Progressing   Problem: Nutrition: Goal: Adequate nutrition will be maintained Outcome: Progressing   Problem: Coping: Goal: Level of anxiety will decrease Outcome: Progressing   Problem: Elimination: Goal: Will not experience complications related to bowel motility Outcome: Progressing Goal: Will not experience complications related to urinary retention Outcome: Progressing   Problem: Safety: Goal: Ability to remain free from injury will improve Outcome: Progressing   

## 2024-09-03 NOTE — Progress Notes (Signed)
 Patient ID: Carmen [name], female   DOB: [birth year], 33 y.o.   MRN: [id number] Subjective: Carmen [name] is a 33 y.o. female with medical history significant of sickle cell disease, chronic pain syndrome, sickle cell anemia, who presents to the emergency room complaining of 10 out of 10 pain in her back and legs consistent with her typical sickle cell crisis.   Patient is endorsing pain of 9/10 this morning.  She denies nausea, vomiting or diarrhea, headache, cough.  No urinary symptoms.   Objective:  Vital signs in last 24 hours:  Vitals:   09/03/24 0650 09/03/24 0800 09/03/24 1043 09/03/24 1305  BP: (!) 115/55  118/68   Pulse: 71  72   Resp: 14 16 16 20   Temp: 98.7 F (37.1 C)  98.6 F (37 C)   TempSrc: Oral  Oral   SpO2: 93%  100%   Weight:      Height:        Intake/Output from previous day:   Intake/Output Summary (Last 24 hours) at 09/03/2024 1338 Last data filed at 09/02/2024 1831 Gross per 24 hour  Intake 923.17 ml  Output --  Net 923.17 ml    Physical Exam: General: Alert, awake, oriented x3, in no acute distress.  HEENT: [place]/AT PEERL, EOMI Neck: Trachea midline,  no masses, no thyromegal,y no JVD, no carotid bruit OROPHARYNX:  Moist, No exudate/ erythema/lesions.  Heart: Regular rate and rhythm, without murmurs, rubs, gallops, PMI non-displaced, no heaves or thrills on palpation.  Lungs: Clear to auscultation, no wheezing or rhonchi noted. No increased vocal fremitus resonant to percussion  Abdomen: Soft, nontender, nondistended, positive bowel sounds, no masses no hepatosplenomegaly noted..  Neuro: No focal neurological deficits noted cranial nerves II through XII grossly intact. DTRs 2+ bilaterally upper and lower extremities. Strength 5 out of 5 in bilateral upper and lower extremities. Musculoskeletal: No warm swelling or erythema around joints, no spinal tenderness noted. Psychiatric: Patient alert and oriented x3, good insight and cognition, good recent to  remote recall. Lymph node survey: No cervical axillary or inguinal lymphadenopathy noted.  Lab Results:  Basic Metabolic Panel:    Component Value Date/Time   NA 136 09/02/2024 0837   NA 138 08/28/2024 1127   K 3.7 09/02/2024 0837   CL 104 09/02/2024 0837   CO2 23 09/02/2024 0837   BUN 6 09/02/2024 0837   BUN 10 08/28/2024 1127   CREATININE 0.47 09/02/2024 0837   GLUCOSE 102 (H) 09/02/2024 0837   CALCIUM  8.7 (L) 09/02/2024 0837   CBC:    Component Value Date/Time   WBC 8.9 09/02/2024 0837   HGB 9.2 (L) 09/02/2024 0837   HGB 9.7 (L) 08/28/2024 1127   HCT 27.2 (L) 09/02/2024 0837   HCT 30.2 (L) 08/28/2024 1127   PLT 329 09/02/2024 0837   PLT 526 (H) 08/28/2024 1127   MCV 87.5 09/02/2024 0837   MCV 92 08/28/2024 1127   NEUTROABS 4.6 09/02/2024 0837   NEUTROABS 4.0 08/28/2024 1127   LYMPHSABS 2.9 09/02/2024 0837   LYMPHSABS 3.0 08/28/2024 1127   MONOABS 1.0 09/02/2024 0837   EOSABS 0.2 09/02/2024 0837   EOSABS 0.1 08/28/2024 1127   BASOSABS 0.1 09/02/2024 0837   BASOSABS 0.0 08/28/2024 1127    No results found for this or any previous visit (from the past 240 hours).  Studies/Results: CT Angio Chest Pulmonary Embolism (PE) W or WO Contrast Result Date: 09/02/2024 CLINICAL DATA:  Pulmonary embolism (PE) suspected, low to intermediate prob, neg D-dimer,  chest pain EXAM: CT ANGIOGRAPHY CHEST WITH CONTRAST TECHNIQUE: Multidetector CT imaging of the chest was performed using the standard protocol during bolus administration of intravenous contrast. Multiplanar CT image reconstructions and MIPs were obtained to evaluate the vascular anatomy. RADIATION DOSE REDUCTION: This exam was performed according to the departmental dose-optimization program which includes automated exposure control, adjustment of the mA and/or kV according to patient size and/or use of iterative reconstruction technique. CONTRAST:  75mL OMNIPAQUE  IOHEXOL  350 MG/ML SOLN COMPARISON:  09/05/2021, 07/31/2024  FINDINGS: Cardiovascular: This is a technically adequate evaluation of the pulmonary vasculature. No filling defects or pulmonary emboli. The heart is unremarkable without pericardial effusion. No evidence of thoracic aortic aneurysm or dissection. Mediastinum/Nodes: No enlarged mediastinal, hilar, or axillary lymph nodes. Thyroid gland, trachea, and esophagus demonstrate no significant findings. Stable soft tissue density within the anterior mediastinum likely reflects thymic tissue. Lungs/Pleura: No acute airspace disease, effusion, or pneumothorax. Minimal hypoventilatory changes at the lung bases. The central airways are patent. Upper Abdomen: No acute abnormality. Musculoskeletal: No acute or destructive bony abnormalities. Chronic bony changes in the thoracic spine compatible with history of sickle cell disease. Reconstructed images demonstrate no additional findings. Review of the MIP images confirms the above findings. IMPRESSION: 1. No evidence of pulmonary embolus. 2. No acute intrathoracic process. Electronically Signed   By: Ozell Daring M.D.   On: 09/02/2024 16:45    Medications: Scheduled Meds:  enoxaparin  (LOVENOX ) injection  40 mg Subcutaneous Q24H   FLUoxetine   40 mg Oral Daily   folic acid   1 mg Oral Daily   HYDROmorphone    Intravenous Q4H   ketorolac   15 mg Intravenous Q6H   morphine   60 mg Oral Q12H   senna-docusate  1 tablet Oral BID   Continuous Infusions: PRN Meds:.cyclobenzaprine , diphenhydrAMINE , naloxone  **AND** sodium chloride  flush, ondansetron , oxyCODONE , polyethylene glycol  Consultants: None  Procedures: None  Antibiotics: None  Assessment/Plan: Active Problems:   Anemia of chronic disease   Leukocytosis   Sickle cell pain crisis (HCC)   Tobacco use disorder, mild, abuse   Chronic pain syndrome   Obesity (BMI 30-39.9)   Hb Sickle Cell Disease with Pain crisis: Continue IVF 0.45% Saline @KVO , continue weight based Dilaudid  PCA, IV Toradol  15 mg Q 6 H  for a total of 5 days, continue oral home pain medications as ordered. Monitor vitals very closely, Re-evaluate pain scale regularly, 2 L of Oxygen  by [place]. Patient encouraged to ambulate on the hallway today.  Anemia of Chronic Disease: Hemoglobin within patient's baseline, no clinical indication for transfusion. Will continue to monitor daily CBC. Chronic pain Syndrome: Continue oral home pain medication. Obesity [BMI 30-39.9]: Encouraged patient to lifestyle modification, diet and exercise. Tobacco use disorder: Encouraged smoking cessation.  Code Status: Full Code Family Communication: N/A Disposition Plan: Not yet ready for discharge  [name] Cover NP  If 7PM-7AM, please contact night-coverage.  09/03/2024, 1:38 PM  LOS: 2 days

## 2024-09-03 NOTE — Plan of Care (Signed)
  Problem: Education: Goal: Knowledge of General Education information will improve Description: Including pain rating scale, medication(s)/side effects and non-pharmacologic comfort measures Outcome: Progressing   Problem: Clinical Measurements: Goal: Ability to maintain clinical measurements within normal limits will improve Outcome: Progressing Goal: Cardiovascular complication will be avoided Outcome: Progressing   Problem: Health Behavior/Discharge Planning: Goal: Ability to manage health-related needs will improve Outcome: Not Progressing   Problem: Clinical Measurements: Goal: Will remain free from infection Outcome: Not Progressing Goal: Diagnostic test results will improve Outcome: Not Progressing Goal: Respiratory complications will improve Outcome: Not Progressing

## 2024-09-03 NOTE — Progress Notes (Deleted)
 SABRA

## 2024-09-04 LAB — TOXASSURE FLEX 15, UR

## 2024-09-04 LAB — OPIATE CLASS, MS, UR RFX
Codeine: NOT DETECTED ng/mg{creat}
Dihydrocodeine: NOT DETECTED ng/mg{creat}
Hydrocodone: NOT DETECTED ng/mg{creat}
Hydromorphone: 782 ng/mg{creat}
Morphine: >8850 ng/mg{creat}
Norcodeine: NOT DETECTED ng/mg{creat}
Norhydrocodone: NOT DETECTED ng/mg{creat}
Normorphine: 4413 ng/mg{creat}
Opiate Class Confirmation: POSITIVE — AB

## 2024-09-04 LAB — CBC
HCT: 27.4 % — ABNORMAL LOW (ref 36.0–46.0)
Hemoglobin: 9.7 g/dL — ABNORMAL LOW (ref 12.0–15.0)
MCH: 30.1 pg (ref 26.0–34.0)
MCHC: 35.4 g/dL (ref 30.0–36.0)
MCV: 85.1 fL (ref 80.0–100.0)
Platelets: 415 K/uL — ABNORMAL HIGH (ref 150–400)
RBC: 3.22 MIL/uL — ABNORMAL LOW (ref 3.87–5.11)
RDW: 16.1 % — ABNORMAL HIGH (ref 11.5–15.5)
WBC: 12.1 K/uL — ABNORMAL HIGH (ref 4.0–10.5)
nRBC: 1.2 % — ABNORMAL HIGH (ref 0.0–0.2)

## 2024-09-04 LAB — COMPREHENSIVE METABOLIC PANEL WITH GFR
ALT: 36 U/L (ref 0–44)
AST: 44 U/L — ABNORMAL HIGH (ref 15–41)
Albumin: 4.4 g/dL (ref 3.5–5.0)
Alkaline Phosphatase: 93 U/L (ref 38–126)
Anion gap: 13 (ref 5–15)
BUN: 9 mg/dL (ref 6–20)
CO2: 25 mmol/L (ref 22–32)
Calcium: 9.7 mg/dL (ref 8.9–10.3)
Chloride: 99 mmol/L (ref 98–111)
Creatinine, Ser: 0.51 mg/dL (ref 0.44–1.00)
GFR, Estimated: >60 mL/min (ref >60)
Glucose, Bld: 83 mg/dL (ref 70–99)
Potassium: 3.9 mmol/L (ref 3.5–5.1)
Sodium: 136 mmol/L (ref 135–145)
Total Bilirubin: 2.1 mg/dL — ABNORMAL HIGH (ref 0.0–1.2)
Total Protein: 8.3 g/dL — ABNORMAL HIGH (ref 6.5–8.1)

## 2024-09-04 LAB — OXYCODONE CLASS, MS, UR RFX
Noroxycodone: 1729 ng/mg{creat}
Noroxymorphone: 96 ng/mg{creat}
Oxycodone Class Confirmation: POSITIVE — AB
Oxycodone: 381 ng/mg{creat}
Oxymorphone: 98 ng/mg{creat}

## 2024-09-04 LAB — CANNABINOIDS, MS, UR RFX
Cannabinoids Confirmation: POSITIVE — AB
Carboxy-THC: 721 ng/mg{creat}

## 2024-09-04 NOTE — Discharge Summary (Signed)
 Physician Discharge Summary  Carmen [name] FMW:[id number] DOB: [birth year] DOA: 09/01/2024  PCP: Paseda, Folashade R, FNP  Admit date: 09/01/2024  Discharge date: 09/04/2024  Discharge Diagnoses:  Active Problems:   Anemia of chronic disease   Leukocytosis   Sickle cell pain crisis (HCC)   Tobacco use disorder, mild, abuse   Chronic pain syndrome   Obesity (BMI 30-39.9)   Discharge Condition: Stable  Disposition:  Pt is discharged home in good condition and is to follow up with Paseda, Folashade R, FNP this week to have labs evaluated. MUSKAN BOLLA is instructed to increase activity slowly and balance with rest for the next few days, and use prescribed medication to complete treatment of pain  Diet: Regular Wt Readings from Last 3 Encounters:  09/01/24 72.2 kg  08/28/24 72.6 kg  07/31/24 75 kg    History of present illness:  Carmen [name] is a 33 y.o. female with medical history significant of sickle cell disease, chronic pain syndrome, sickle cell anemia, who presents to the emergency room complaining of 10/10 pain in her back and legs consistent with her typical sickle cell crisis.  Patient denied any nausea or vomiting. No urinary symptoms, sick contacts or recent travels. She is taking her home regimen apparently but no relief.    ED Course: BP 118/88 (BP Location: Right Arm)  Pulse 98  Temp 98.9 F (37.2 C) (Oral)  Resp 18  Ht 1.6 m (5' 3)  Wt 72.6 kg  SpO2 95%  BMI 28.34 kg/m  She given up to 6 mg of Dilaudid  IV in the ER with no relief.  Patient is therefore being admitted to the hospital for pain management.    Hospital Course:  Patient was admitted for sickle cell pain crisis and managed appropriately with IVF, IV Dilaudid  via PCA and IV Toradol , Patient is reporting improved pain of 4/10 this morning at baseline, she has no new concerns. She is ambulating without assistance, and tolerating PO without N/V. Patient requested to be discharged as she has to be in  court in the AM.  Patient was therefore discharged home today in a hemodynamically stable condition.   Donovan will follow-up with PCP within 1 week of this discharge. Orli was counseled extensively about nonpharmacologic means of pain management, patient verbalized understanding and was appreciative of  the care received during this admission.   We discussed the need for good hydration, monitoring of hydration status, avoidance of heat, cold, stress, and infection triggers. We discussed the need to be adherent with taking her home medications. Patient was reminded of the need to seek medical attention immediately if any symptom of bleeding, anemia, or infection occurs.  Discharge Exam: Vitals:   09/04/24 0833 09/04/24 1013  BP:  115/83  Pulse:  91  Resp: 16 20  Temp:  98.9 F (37.2 C)  SpO2:  96%   Vitals:   09/04/24 0407 09/04/24 0518 09/04/24 0833 09/04/24 1013  BP:  (!) 110/97  115/83  Pulse:  85  91  Resp: 18 18 16 20   Temp:  98.8 F (37.1 C)  98.9 F (37.2 C)  TempSrc:  Oral  Oral  SpO2:  100%  96%  Weight:      Height:        General appearance : Awake, alert, not in any distress. Speech Clear. Not toxic looking HEENT: Atraumatic and Normocephalic, pupils equally reactive to light and accomodation Neck: Supple, no JVD. No cervical lymphadenopathy.  Chest: Good air  entry bilaterally, no added sounds  CVS: S1 S2 regular, no murmurs.  Abdomen: Bowel sounds present, Non tender and not distended with no gaurding, rigidity or rebound. Extremities: B/L Lower Ext shows no edema, both legs are warm to touch Neurology: Awake alert, and oriented X 3, CN II-XII intact, Non focal Skin: No Rash  Discharge Instructions  Discharge Instructions     Call MD for:  persistant nausea and vomiting   Complete by: As directed    Call MD for:  severe uncontrolled pain   Complete by: As directed    Call MD for:  temperature >100.4   Complete by: As directed    Diet - low sodium heart  healthy   Complete by: As directed    Increase activity slowly   Complete by: As directed       Allergies as of 09/04/2024       Reactions   Ketamine  Hives, Nausea And Vomiting, Other (See Comments)   Makes the patient feel funny also        Medication List     TAKE these medications    cyclobenzaprine  10 MG tablet Commonly known as: FLEXERIL  TAKE 1 TABLET BY MOUTH DAILY AT BEDTIME AS NEEDED FOR MUSCLE SPASMS What changed: See the new instructions.   FLUoxetine  40 MG capsule Commonly known as: PROZAC  Take 1 capsule (40 mg total) by mouth daily.   folic acid  1 MG tablet Commonly known as: FOLVITE  Take 1 tablet (1 mg total) by mouth daily.   morphine  60 MG 12 hr tablet Commonly known as: MS CONTIN  Take 1 tablet (60 mg total) by mouth every 12 (twelve) hours.   naloxone  4 MG/0.1ML Liqd nasal spray kit Commonly known as: NARCAN  1 nasal spray in one nostril as needed for opoid overdose   nicotine  14 mg/24hr patch Commonly known as: NICODERM CQ  - dosed in mg/24 hours Place 1 patch (14 mg total) onto the skin daily.   Oxycodone  HCl 10 MG Tabs Take 1 tablet (10 mg total) by mouth every 6 (six) hours as needed (pain). Start taking on: September 05, 2024   Vitamin D  (Ergocalciferol ) 1.25 MG (50000 UNIT) Caps capsule Commonly known as: DRISDOL  TAKE 1 CAPSULE BY MOUTH ONE TIME PER WEEK What changed:  how much to take how to take this when to take this additional instructions        The results of significant diagnostics from this hospitalization (including imaging, microbiology, ancillary and laboratory) are listed below for reference.    Significant Diagnostic Studies: CT Angio Chest Pulmonary Embolism (PE) W or WO Contrast Result Date: 09/02/2024 CLINICAL DATA:  Pulmonary embolism (PE) suspected, low to intermediate prob, neg D-dimer, chest pain EXAM: CT ANGIOGRAPHY CHEST WITH CONTRAST TECHNIQUE: Multidetector CT imaging of the chest was performed using the  standard protocol during bolus administration of intravenous contrast. Multiplanar CT image reconstructions and MIPs were obtained to evaluate the vascular anatomy. RADIATION DOSE REDUCTION: This exam was performed according to the departmental dose-optimization program which includes automated exposure control, adjustment of the mA and/or kV according to patient size and/or use of iterative reconstruction technique. CONTRAST:  75mL OMNIPAQUE  IOHEXOL  350 MG/ML SOLN COMPARISON:  09/05/2021, 07/31/2024 FINDINGS: Cardiovascular: This is a technically adequate evaluation of the pulmonary vasculature. No filling defects or pulmonary emboli. The heart is unremarkable without pericardial effusion. No evidence of thoracic aortic aneurysm or dissection. Mediastinum/Nodes: No enlarged mediastinal, hilar, or axillary lymph nodes. Thyroid gland, trachea, and esophagus demonstrate no significant findings. Stable  soft tissue density within the anterior mediastinum likely reflects thymic tissue. Lungs/Pleura: No acute airspace disease, effusion, or pneumothorax. Minimal hypoventilatory changes at the lung bases. The central airways are patent. Upper Abdomen: No acute abnormality. Musculoskeletal: No acute or destructive bony abnormalities. Chronic bony changes in the thoracic spine compatible with history of sickle cell disease. Reconstructed images demonstrate no additional findings. Review of the MIP images confirms the above findings. IMPRESSION: 1. No evidence of pulmonary embolus. 2. No acute intrathoracic process. Electronically Signed   By: Ozell Daring M.D.   On: 09/02/2024 16:45    Microbiology: No results found for this or any previous visit (from the past 240 hours).   Labs: Basic Metabolic Panel: Recent Labs  Lab 09/01/24 1623 09/01/24 1743 09/02/24 0837 09/04/24 0953  NA 138  --  136 136  K 3.5  --  3.7 3.9  CL 105  --  104 99  CO2 24  --  23 25  GLUCOSE 96  --  102* 83  BUN 9  --  6 9   CREATININE 0.57 0.57 0.47 0.51  CALCIUM  9.5  --  8.7* 9.7   Liver Function Tests: Recent Labs  Lab 09/01/24 1623 09/02/24 0837 09/04/24 0953  AST 21 65* 44*  ALT 13 29 36  ALKPHOS 68 68 93  BILITOT 1.3* 1.2 2.1*  PROT 7.9 6.9 8.3*  ALBUMIN 4.4 4.0 4.4   No results for input(s): LIPASE, AMYLASE in the last 168 hours. No results for input(s): AMMONIA in the last 168 hours. CBC: Recent Labs  Lab 09/01/24 1127 09/01/24 1622 09/02/24 0837 09/04/24 0953  WBC 8.7 9.7 8.9 12.1*  NEUTROABS 4.6  --  4.6  --   HGB 9.8* 9.7* 9.2* 9.7*  HCT 28.3* 27.2* 27.2* 27.4*  MCV 83.7 84.5 87.5 85.1  PLT 540* 471* 329 415*   Cardiac Enzymes: No results for input(s): CKTOTAL, CKMB, CKMBINDEX, TROPONINI in the last 168 hours. BNP: Invalid input(s): POCBNP CBG: No results for input(s): GLUCAP in the last 168 hours.  Time coordinating discharge: 50 minutes  Signed:  [name] Cover NP    09/04/2024, 3:01 PM

## 2024-09-04 NOTE — Plan of Care (Signed)
  Problem: Education: Goal: Knowledge of General Education information will improve Description: Including pain rating scale, medication(s)/side effects and non-pharmacologic comfort measures Outcome: Completed/Met   Problem: Health Behavior/Discharge Planning: Goal: Ability to manage health-related needs will improve Outcome: Completed/Met   Problem: Clinical Measurements: Goal: Ability to maintain clinical measurements within normal limits will improve Outcome: Completed/Met Goal: Will remain free from infection Outcome: Completed/Met Goal: Diagnostic test results will improve Outcome: Completed/Met Goal: Respiratory complications will improve Outcome: Completed/Met Goal: Cardiovascular complication will be avoided Outcome: Completed/Met   Problem: Activity: Goal: Risk for activity intolerance will decrease Outcome: Completed/Met   Problem: Nutrition: Goal: Adequate nutrition will be maintained Outcome: Completed/Met   Problem: Coping: Goal: Level of anxiety will decrease Outcome: Completed/Met   Problem: Elimination: Goal: Will not experience complications related to bowel motility Outcome: Completed/Met Goal: Will not experience complications related to urinary retention Outcome: Completed/Met   Problem: Pain Managment: Goal: General experience of comfort will improve and/or be controlled Outcome: Completed/Met   Problem: Safety: Goal: Ability to remain free from injury will improve Outcome: Completed/Met   Problem: Skin Integrity: Goal: Risk for impaired skin integrity will decrease Outcome: Completed/Met   Problem: Education: Goal: Knowledge of vaso-occlusive preventative measures will improve Outcome: Completed/Met Goal: Awareness of infection prevention will improve Outcome: Completed/Met Goal: Awareness of signs and symptoms of anemia will improve Outcome: Completed/Met Goal: Long-term complications will improve Outcome: Completed/Met   Problem:  Self-Care: Goal: Ability to incorporate actions that prevent/reduce pain crisis will improve Outcome: Completed/Met   Problem: Bowel/Gastric: Goal: Gut motility will be maintained Outcome: Completed/Met   Problem: Tissue Perfusion: Goal: Complications related to inadequate tissue perfusion will be avoided or minimized Outcome: Completed/Met   Problem: Respiratory: Goal: Pulmonary complications will be avoided or minimized Outcome: Completed/Met Goal: Acute Chest Syndrome will be identified early to prevent complications Outcome: Completed/Met   Problem: Fluid Volume: Goal: Ability to maintain a balanced intake and output will improve Outcome: Completed/Met   Problem: Sensory: Goal: Pain level will decrease with appropriate interventions Outcome: Completed/Met   Problem: Health Behavior: Goal: Postive changes in compliance with treatment and prescription regimens will improve Outcome: Completed/Met

## 2024-09-04 NOTE — Progress Notes (Signed)
 Erroneous encounter

## 2024-09-05 ENCOUNTER — Telehealth: Payer: Self-pay

## 2024-09-05 NOTE — Transitions of Care (Post Inpatient/ED Visit) (Signed)
   09/05/2024  Name: Carmen [name] MRN: [id number] DOB: 1991-10-01  Today's TOC FU Call Status: Today's TOC FU Call Status:: Unsuccessful Call (1st Attempt) Unsuccessful Call (1st Attempt) Date: 09/05/24  Attempted to reach the patient regarding the most recent Inpatient/ED visit.  Follow Up Plan: Additional outreach attempts will be made to reach the patient to complete the Transitions of Care (Post Inpatient/ED visit) call.   Arvin Seip RN, BSN, CCM [employer], Population Health Case Manager Phone: [phone]

## 2024-09-06 ENCOUNTER — Encounter (HOSPITAL_COMMUNITY): Payer: Self-pay

## 2024-09-06 ENCOUNTER — Telehealth: Payer: Self-pay | Admitting: Nurse Practitioner

## 2024-09-06 ENCOUNTER — Emergency Department (HOSPITAL_COMMUNITY)
Admission: EM | Admit: 2024-09-06 | Discharge: 2024-09-06 | Discharge status: Against Medical Advice | Payer: MEDICAID | Attending: Emergency Medicine | Admitting: Emergency Medicine

## 2024-09-06 ENCOUNTER — Telehealth: Payer: Self-pay

## 2024-09-06 ENCOUNTER — Emergency Department (HOSPITAL_COMMUNITY): Payer: MEDICAID

## 2024-09-06 ENCOUNTER — Other Ambulatory Visit: Payer: Self-pay

## 2024-09-06 ENCOUNTER — Emergency Department (HOSPITAL_COMMUNITY)
Admission: EM | Admit: 2024-09-06 | Discharge: 2024-09-06 | Disposition: A | Discharge status: Home / Self Care | Payer: MEDICAID | Source: Home / Self Care | Attending: Emergency Medicine | Admitting: Emergency Medicine

## 2024-09-06 DIAGNOSIS — D5700 Hb-SS disease with crisis, unspecified: Secondary | ICD-10-CM

## 2024-09-06 DIAGNOSIS — R0602 Shortness of breath: Secondary | ICD-10-CM | POA: Diagnosis not present

## 2024-09-06 DIAGNOSIS — R0789 Other chest pain: Secondary | ICD-10-CM | POA: Diagnosis present

## 2024-09-06 DIAGNOSIS — Z5329 Procedure and treatment not carried out because of patient's decision for other reasons: Secondary | ICD-10-CM | POA: Diagnosis not present

## 2024-09-06 DIAGNOSIS — D57219 Sickle-cell/Hb-C disease with crisis, unspecified: Secondary | ICD-10-CM | POA: Insufficient documentation

## 2024-09-06 LAB — CBC WITH DIFFERENTIAL/PLATELET
Abs Immature Granulocytes: 0.08 K/uL — ABNORMAL HIGH (ref 0.00–0.07)
Basophils Absolute: 0.1 K/uL (ref 0.0–0.1)
Basophils Relative: 1 %
Eosinophils Absolute: 0.2 K/uL (ref 0.0–0.5)
Eosinophils Relative: 2 %
HCT: 24.6 % — ABNORMAL LOW (ref 36.0–46.0)
Hemoglobin: 8.9 g/dL — ABNORMAL LOW (ref 12.0–15.0)
Immature Granulocytes: 1 %
Lymphocytes Relative: 26 %
Lymphs Abs: 2.8 K/uL (ref 0.7–4.0)
MCH: 30.2 pg (ref 26.0–34.0)
MCHC: 36.2 g/dL — ABNORMAL HIGH (ref 30.0–36.0)
MCV: 83.4 fL (ref 80.0–100.0)
Monocytes Absolute: 1.2 K/uL — ABNORMAL HIGH (ref 0.1–1.0)
Monocytes Relative: 11 %
Neutro Abs: 6.4 K/uL (ref 1.7–7.7)
Neutrophils Relative %: 59 %
Platelets: 385 K/uL (ref 150–400)
RBC: 2.95 MIL/uL — ABNORMAL LOW (ref 3.87–5.11)
RDW: 14.6 % (ref 11.5–15.5)
Smear Review: NORMAL
WBC: 10.8 K/uL — ABNORMAL HIGH (ref 4.0–10.5)
nRBC: 0.2 % (ref 0.0–0.2)

## 2024-09-06 LAB — RESP PANEL BY RT-PCR (RSV, FLU A&B, COVID)  RVPGX2
Influenza A by PCR: NEGATIVE
Influenza B by PCR: NEGATIVE
Resp Syncytial Virus by PCR: NEGATIVE
SARS Coronavirus 2 by RT PCR: NEGATIVE

## 2024-09-06 LAB — COMPREHENSIVE METABOLIC PANEL WITH GFR
ALT: 34 U/L (ref 0–44)
AST: 35 U/L (ref 15–41)
Albumin: 3.5 g/dL (ref 3.5–5.0)
Alkaline Phosphatase: 65 U/L (ref 38–126)
Anion gap: 10 (ref 5–15)
BUN: 7 mg/dL (ref 6–20)
CO2: 26 mmol/L (ref 22–32)
Calcium: 9 mg/dL (ref 8.9–10.3)
Chloride: 104 mmol/L (ref 98–111)
Creatinine, Ser: 0.6 mg/dL (ref 0.44–1.00)
GFR, Estimated: >60 mL/min (ref >60)
Glucose, Bld: 93 mg/dL (ref 70–99)
Potassium: 3.6 mmol/L (ref 3.5–5.1)
Sodium: 140 mmol/L (ref 135–145)
Total Bilirubin: 1.4 mg/dL — ABNORMAL HIGH (ref 0.0–1.2)
Total Protein: 7.5 g/dL (ref 6.5–8.1)

## 2024-09-06 LAB — RETICULOCYTES
Immature Retic Fract: 35.2 % — ABNORMAL HIGH (ref 2.3–15.9)
RBC.: 2.97 MIL/uL — ABNORMAL LOW (ref 3.87–5.11)
Retic Count, Absolute: 209.1 K/uL — ABNORMAL HIGH (ref 19.0–186.0)
Retic Ct Pct: 7 % — ABNORMAL HIGH (ref 0.4–3.1)

## 2024-09-06 LAB — TROPONIN I (HIGH SENSITIVITY): Troponin I (High Sensitivity): 4 ng/L (ref <18)

## 2024-09-06 LAB — HCG, SERUM, QUALITATIVE: Preg, Serum: NEGATIVE

## 2024-09-06 MED ORDER — LIDOCAINE 5 % EX PTCH: 1.0000 | MEDICATED_PATCH | CUTANEOUS | 0 refills | Status: DC

## 2024-09-06 MED ORDER — LIDOCAINE 5 % EX PTCH
1.0000 | MEDICATED_PATCH | Freq: Once | CUTANEOUS | Status: DC
  Administered 2024-09-06: 1 via TRANSDERMAL
  Filled 2024-09-06: qty 3

## 2024-09-06 MED ORDER — HYDROMORPHONE HCL 1 MG/ML IJ SOLN
2.0000 mg | Freq: Once | INTRAMUSCULAR | Status: AC
  Administered 2024-09-06: 2 mg via INTRAVENOUS
  Filled 2024-09-06: qty 2

## 2024-09-06 MED ORDER — KETOROLAC TROMETHAMINE 15 MG/ML IJ SOLN
15.0000 mg | Freq: Once | INTRAMUSCULAR | Status: DC
  Filled 2024-09-06: qty 1

## 2024-09-06 MED ORDER — HYDROMORPHONE HCL 1 MG/ML IJ SOLN: 2.0000 mg | INTRAMUSCULAR | Status: DC

## 2024-09-06 MED ORDER — KETOROLAC TROMETHAMINE 15 MG/ML IJ SOLN
15.0000 mg | Freq: Once | INTRAMUSCULAR | Status: AC
  Administered 2024-09-06: 15 mg via INTRAVENOUS

## 2024-09-06 MED ORDER — HYDROMORPHONE HCL 1 MG/ML IJ SOLN
2.0000 mg | INTRAMUSCULAR | Status: DC
  Filled 2024-09-06: qty 2

## 2024-09-06 MED ORDER — HYDROMORPHONE HCL 1 MG/ML IJ SOLN
2.0000 mg | INTRAMUSCULAR | Status: AC
  Administered 2024-09-06: 2 mg via INTRAVENOUS

## 2024-09-06 NOTE — Transitions of Care (Post Inpatient/ED Visit) (Signed)
   09/06/2024  Name: Carmen [name] MRN: [id number] DOB: 19-Jul-1991  Today's TOC FU Call Status: Today's TOC FU Call Status:: Unsuccessful Call (2nd Attempt) Unsuccessful Call (2nd Attempt) Date: 09/06/24  Attempted to reach the patient regarding the most recent Inpatient/ED visit.  Follow Up Plan: Additional outreach attempts will be made to reach the patient to complete the Transitions of Care (Post Inpatient/ED visit) call.   Arvin Seip RN, BSN, CCM [employer], Population Health Case Manager Phone: [phone]

## 2024-09-06 NOTE — ED Triage Notes (Signed)
 Pt is here for re-evaluation of sickle cell pain which is continuing.  Pt went to [facility] this am but left AMA after triage.  Pt arrives ambulatory.  Pt reports pain in chest and back.

## 2024-09-06 NOTE — ED Notes (Signed)
 Patient ambulatory to restroom with steady gait with regular, unlabored breathing.

## 2024-09-06 NOTE — ED Notes (Signed)
 Dr Suzette told me the patient was wanting to be seen by another physician, instead of him. He stated that her only options were to be see/treated by him or leave AMA. I went in to ask her if she would like to be seen. She asked if it would be by the same physician that saw her earlier. I told her it would be. She said she would be leaving. She then asked where the charge nurse was-stated the charge nurse was supposed to come and see her. I told the patient the charge nurse had gotten busy and was trying to make her way to her room. She responded saying, that's all you needed to say then...Carmen [name]. I calmly told the patient that I had not been rude to her and asked her to not be rude to me. She responded saying, you were headed in that direction. I then asked her if she still wanted to see the charge nurse and she refused to answer, soon walking out of her room with her belongings and walked towards the exit. The provider was notified.

## 2024-09-06 NOTE — Telephone Encounter (Signed)
 Caller & Relationship to patient:   MRN #  [id number]   Call Back Number:   Date of Last Office Visit: 08/31/2024     Date of Next Office Visit: 11/28/2024    Medication(s) to be Refilled: Morphine  60mg   Preferred Pharmacy:   ** Please notify patient to allow 48-72 hours to process** **Let patient know to contact pharmacy at the end of the day to make sure medication is ready. ** **If patient has not been seen in a year or longer, book an appointment **Advise to use MyChart for refill requests OR to contact their pharmacy

## 2024-09-06 NOTE — ED Provider Notes (Signed)
 [place] EMERGENCY DEPARTMENT AT [facility] Provider Note   CSN: [id number] Arrival date & time: 09/06/24  9053     Patient presents with: Sickle Cell Pain Crisis, Chest Pain, and Shortness of Breath   Carmen [name] is a 33 y.o. female.  {Add pertinent medical, surgical, social history, OB history to YEP:67052} Patient with history of sickle cell disease.  She was recently admitted for sickle cell crisis with chest discomfort.  Patient returns today stating that she is having some chest discomfort now.   Sickle Cell Pain Crisis Associated symptoms: chest pain and shortness of breath   Chest Pain Associated symptoms: shortness of breath   Shortness of Breath Associated symptoms: chest pain        Prior to Admission medications   Medication Sig Start Date End Date Taking? Authorizing Provider  cyclobenzaprine  (FLEXERIL ) 10 MG tablet TAKE 1 TABLET BY MOUTH DAILY AT BEDTIME AS NEEDED FOR MUSCLE SPASMS Patient taking differently: Take 10 mg by mouth daily as needed for muscle spasms. 07/30/24   Paseda, Folashade R, FNP  FLUoxetine  (PROZAC ) 40 MG capsule Take 1 capsule (40 mg total) by mouth daily. 08/28/24   Paseda, Folashade R, FNP  folic acid  (FOLVITE ) 1 MG tablet Take 1 tablet (1 mg total) by mouth daily. 12/27/23   Paseda, Folashade R, FNP  morphine  (MS CONTIN ) 60 MG 12 hr tablet Take 1 tablet (60 mg total) by mouth every 12 (twelve) hours. 08/07/24   Paseda, Folashade R, FNP  naloxone  (NARCAN ) nasal spray 4 mg/0.1 mL 1 nasal spray in one nostril as needed for opoid overdose 05/28/24   Paseda, Folashade R, FNP  nicotine  (NICODERM CQ  - DOSED IN MG/24 HOURS) 14 mg/24hr patch Place 1 patch (14 mg total) onto the skin daily. Patient not taking: Reported on 09/01/2024 08/28/24   Paseda, Folashade R, FNP  Oxycodone  HCl 10 MG TABS Take 1 tablet (10 mg total) by mouth every 6 (six) hours as needed (pain). 09/05/24   Paseda, Folashade R, FNP  Vitamin D , Ergocalciferol , (DRISDOL )  1.25 MG (50000 UNIT) CAPS capsule TAKE 1 CAPSULE BY MOUTH ONE TIME PER WEEK Patient taking differently: Take 50,000 Units by mouth every 7 (seven) days. Every Wednesday 12/27/23   Paseda, Folashade R, FNP    Allergies: Ketamine     Review of Systems  Respiratory:  Positive for shortness of breath.   Cardiovascular:  Positive for chest pain.    Updated Vital Signs BP 119/76 (BP Location: Left Arm)   Pulse (!) 106   Temp 99 F (37.2 C) (Oral)   Resp 16   SpO2 100%   Physical Exam  (all labs ordered are listed, but only abnormal results are displayed) Labs Reviewed - No data to display  EKG: None  Radiology: No results found.  {Document cardiac monitor, telemetry assessment procedure when appropriate:32947} Procedures   Medications Ordered in the ED - No data to display Patient decided when I was doing the history on her that she did not want me to be her physician.  I explained to her that there were no other providers that would be taking care of her and that I would be glad to take care of her if she wanted to.  If she did not want me  to take care of her then she would need to leave the emergency department.  Patient decided to leave AMA   {Click here for ABCD2, HEART and other calculators REFRESH Note before signing:1}  Medical Decision Making Amount and/or Complexity of Data Reviewed ECG/medicine tests: ordered.   Sickle cell disease with atypical chest pain.  Patient left AMA  {Document critical care time when appropriate  Document review of labs and clinical decision tools ie [id number], etc  Document your independent review of radiology images and any outside records  Document your discussion with family members, caretakers and with consultants  Document social determinants of health affecting pt's care  Document your decision making why or why not admission, treatments were needed:32947:::1}   Final diagnoses:  Atypical chest pain     ED Discharge Orders     None

## 2024-09-06 NOTE — ED Notes (Signed)
Got patient into a gown on the monitor got patient a warm blanket patient is resting with call bell in reach  

## 2024-09-06 NOTE — ED Notes (Signed)
 Patient discharged to home with SO. Patient instructed not to drive. Patient verbalized understanding. Patient given written and oral discharge instructions. Patient ambulatory to lobby with steady gait. Patient breathing without difficulty. Patient denies questions, concerns or needs at this time.

## 2024-09-06 NOTE — ED Triage Notes (Signed)
 Patient states that she is having sharp pain in her chest that started at her last admission for sickle cell crisis. She reports she was discharged from the hospital on Tuesday and her chest pain is getting worse. She states she has taken her home meds as prescribed and hydrated but the pain is not getting any better. She reports she has been a little shob and weaker than normal. She reports the chest pain radiates into her back

## 2024-09-06 NOTE — Discharge Instructions (Signed)
 You were seen in the emergency department for your chest pain.  Your workup showed no signs of heart attack, pneumonia or acute chest syndrome.  This may be related to sickle cell pain or a viral infection causing inflammation of your chest wall.  You can continue to take your home pain medication in addition to ibuprofen  and lidocaine  patches.  You should follow-up with your sickle cell doctor to have your symptoms rechecked.  You should return to the emergency department if having significantly worsening pain, severe shortness of breath, fevers or any other new or concerning symptoms.

## 2024-09-06 NOTE — ED Provider Notes (Signed)
 [facility] Provider Note   CSN: [id number] Arrival date & time: 09/06/24  1112     Patient presents with: Sickle Cell Pain Crisis and Chest Pain   Carmen [name] is a 33 y.o. female.   Patient is a 33 year old female with a past medical history of sickle cell anemia and chronic pain presenting to the emergency department with chest pain.  The patient states that she was admitted to the hospital over the weekend and discharged 2 days ago for sickle cell pain crisis.  She states since being home she has been having chest pain.  She states that it is a dull ache across her chest with intermittent sharp pains.  She states that the pain is worse when she coughs.  She denies a productive cough, associated fever, congestion or sore throat.  She states that she has had pneumonia and acute chest in the past.  She states she has been taking her pain medication at home but still having significant amount of pain.  She denies any history of VTE or blood thinner use.  The history is provided by the patient.  Sickle Cell Pain Crisis Associated symptoms: chest pain   Chest Pain      Prior to Admission medications   Medication Sig Start Date End Date Taking? Authorizing Provider  lidocaine  (LIDODERM ) 5 % Place 1 patch onto the skin daily. Remove & Discard patch within 12 hours or as directed by MD 09/06/24  Yes Ellouise, Jeren Dufrane K, DO  cyclobenzaprine  (FLEXERIL ) 10 MG tablet TAKE 1 TABLET BY MOUTH DAILY AT BEDTIME AS NEEDED FOR MUSCLE SPASMS Patient taking differently: Take 10 mg by mouth daily as needed for muscle spasms. 07/30/24   Paseda, Folashade R, FNP  FLUoxetine  (PROZAC ) 40 MG capsule Take 1 capsule (40 mg total) by mouth daily. 08/28/24   Paseda, Folashade R, FNP  folic acid  (FOLVITE ) 1 MG tablet Take 1 tablet (1 mg total) by mouth daily. 12/27/23   Paseda, Folashade R, FNP  morphine  (MS CONTIN ) 60 MG 12 hr tablet Take 1 tablet (60 mg total) by  mouth every 12 (twelve) hours. 08/07/24   Paseda, Folashade R, FNP  naloxone  (NARCAN ) nasal spray 4 mg/0.1 mL 1 nasal spray in one nostril as needed for opoid overdose 05/28/24   Paseda, Folashade R, FNP  nicotine  (NICODERM CQ  - DOSED IN MG/24 HOURS) 14 mg/24hr patch Place 1 patch (14 mg total) onto the skin daily. Patient not taking: Reported on 09/01/2024 08/28/24   Paseda, Folashade R, FNP  Oxycodone  HCl 10 MG TABS Take 1 tablet (10 mg total) by mouth every 6 (six) hours as needed (pain). 09/05/24   Paseda, Folashade R, FNP  Vitamin D , Ergocalciferol , (DRISDOL ) 1.25 MG (50000 UNIT) CAPS capsule TAKE 1 CAPSULE BY MOUTH ONE TIME PER WEEK Patient taking differently: Take 50,000 Units by mouth every 7 (seven) days. Every Wednesday 12/27/23   Paseda, Folashade R, FNP    Allergies: Ketamine     Review of Systems  Cardiovascular:  Positive for chest pain.    Updated Vital Signs BP 127/74   Pulse 86   Temp 98.6 F (37 C) (Oral)   Resp 18   LMP 09/03/2024 (Exact Date)   SpO2 100%   Physical Exam Vitals and nursing note reviewed.  Constitutional:      General: She is not in acute distress.    Appearance: She is well-developed.  HENT:     Head: Normocephalic.  Eyes:  Extraocular Movements: Extraocular movements intact.  Cardiovascular:     Rate and Rhythm: Normal rate and regular rhythm.     Pulses:          Radial pulses are 2+ on the right side and 2+ on the left side.     Heart sounds: Normal heart sounds.  Pulmonary:     Effort: Pulmonary effort is normal.     Breath sounds: Normal breath sounds.  Chest:     Chest wall: Tenderness (Diffuse intercostal tenderness) present.  Abdominal:     Palpations: Abdomen is soft.     Tenderness: There is no abdominal tenderness.  Musculoskeletal:        General: Normal range of motion.     Cervical back: Normal range of motion and neck supple.     Right lower leg: No edema.     Left lower leg: No edema.  Skin:    General: Skin is warm  and dry.  Neurological:     General: No focal deficit present.     Mental Status: She is alert and oriented to person, place, and time.  Psychiatric:        Mood and Affect: Mood normal.        Behavior: Behavior normal.     (all labs ordered are listed, but only abnormal results are displayed) Labs Reviewed  COMPREHENSIVE METABOLIC PANEL WITH GFR - Abnormal; Notable for the following components:      Result Value   Total Bilirubin 1.4 (*)    All other components within normal limits  CBC WITH DIFFERENTIAL/PLATELET - Abnormal; Notable for the following components:   WBC 10.8 (*)    RBC 2.95 (*)    Hemoglobin 8.9 (*)    HCT 24.6 (*)    MCHC 36.2 (*)    Monocytes Absolute 1.2 (*)    Abs Immature Granulocytes 0.08 (*)    All other components within normal limits  RETICULOCYTES - Abnormal; Notable for the following components:   Retic Ct Pct 7.0 (*)    RBC. 2.97 (*)    Retic Count, Absolute 209.1 (*)    Immature Retic Fract 35.2 (*)    All other components within normal limits  RESP PANEL BY RT-PCR (RSV, FLU A&B, COVID)  [id number]  HCG, SERUM, QUALITATIVE  TROPONIN I (HIGH SENSITIVITY)    EKG: EKG Interpretation Date/Time:  Thursday September 06 2024 11:20:43 EST Ventricular Rate:  85 PR Interval:  136 QRS Duration:  76 QT Interval:  354 QTC Calculation: 421 R Axis:   54  Text Interpretation: Normal sinus rhythm with sinus arrhythmia Normal ECG HEART RATE DECREASED SINCE last EKG, otherwise no significant change Confirmed by Ellouise Fine ([id number]) on 09/06/2024 12:18:55 PM  Radiology: DG Chest 2 View Result Date: 09/06/2024 EXAM: 2 VIEW(S) XRAY OF THE CHEST 09/06/2024 12:01:00 PM COMPARISON: 07/31/2024 CLINICAL HISTORY: SOB FINDINGS: LUNGS AND PLEURA: No focal pulmonary opacity. No pulmonary edema. No pleural effusion. No pneumothorax. HEART AND MEDIASTINUM: No acute abnormality of the cardiac and mediastinal silhouettes. BONES AND SOFT TISSUES: No acute osseous  abnormality. IMPRESSION: 1. Normal chest radiograph. Electronically signed by: Waddell Calk MD 09/06/2024 01:11 PM EST RP Workstation: [id number]     Procedures   Medications Ordered in the ED  lidocaine  (LIDODERM ) 5 % 1-3 patch (1 patch Transdermal Patch Applied 09/06/24 1335)  HYDROmorphone  (DILAUDID ) injection 2 mg (has no administration in time range)  HYDROmorphone  (DILAUDID ) injection 2 mg (2 mg Intravenous Given 09/06/24 1405)  ketorolac  (TORADOL )  15 MG/ML injection 15 mg (15 mg Intravenous Given 09/06/24 1402)  HYDROmorphone  (DILAUDID ) injection 2 mg (2 mg Intravenous Given 09/06/24 1527)    Clinical Course as of 09/06/24 1553  Thu Sep 06, 2024  1357 No acute disease on CXR, CBC at baseline, appropriate reticulocyte count. Troponin and pain control pending. [VK]  1459 Initial troponin negative, symptoms ongoing for several days so single troponin is sufficient. Suspect MSK pain vs SS pain. Patient will be given additional dose of pain meds prior to disposition. [VK]    Clinical Course User Index [VK] Kingsley, Alajia Schmelzer K, DO                                 Medical Decision Making This patient presents to the ED with chief complaint(s) of chest pain with pertinent past medical history of sickle cell anemia which further complicates the presenting complaint. The complaint involves an extensive differential diagnosis and also carries with it a high risk of complications and morbidity.    The differential diagnosis includes ACS, arrhythmia, anemia, pneumonia, pneumothorax, pulmonary edema, pleural effusion, lower suspicion for PE she has normal vital signs and had a negative PE study 4 days ago, costochondritis, muscle strain, sickle cell pain, acute chest, chronic pain syndrome  Additional history obtained: Additional history obtained from N/A Records reviewed previous admission documents  ED Course and Reassessment: On patient's arrival she is hemodynamically stable in no acute  distress.  EKG performed in triage showed normal sinus rhythm without acute ischemic changes.  She had labs initiated in triage that were pending at this time.  Troponin will be added on as well as viral swab.  Chest x-ray is pending.  Will be given pain control and will be closely reassessed.  Independent labs interpretation:  The following labs were independently interpreted: at baseline/no acute abnormliaty  Independent visualization of imaging: - I independently visualized the following imaging with scope of interpretation limited to determining acute life threatening conditions related to emergency care: CXR, which revealed no acute disease  Consultation: - Consulted or discussed management/test interpretation w/ external professional: N/A  Consideration for admission or further workup: Patient has no emergent conditions requiring admission or further work-up at this time and is stable for discharge home with primary care follow-up  Social Determinants of health: N/A    Amount and/or Complexity of Data Reviewed Labs: ordered. Radiology: ordered.  Risk Prescription drug management.       Final diagnoses:  Chest wall pain  Sickle cell pain crisis [facility])    ED Discharge Orders          Ordered    lidocaine  (LIDODERM ) 5 %  Every 24 hours        09/06/24 1552               Kingsley, Lawyer Washabaugh K, DO 09/06/24 1553

## 2024-09-06 NOTE — ED Triage Notes (Signed)
 C/o sickle cell pain in chest and back. Endorsing SOB. Seen a few days ago for same and had CT scan. Sickle cell pain has improved, however patient is concerned about the persisting chest pain which radiates to her back.

## 2024-09-07 ENCOUNTER — Telehealth: Payer: Self-pay

## 2024-09-07 ENCOUNTER — Other Ambulatory Visit: Payer: Self-pay | Admitting: Nurse Practitioner

## 2024-09-07 DIAGNOSIS — D571 Sickle-cell disease without crisis: Secondary | ICD-10-CM

## 2024-09-07 DIAGNOSIS — Z79891 Long term (current) use of opiate analgesic: Secondary | ICD-10-CM

## 2024-09-07 MED ORDER — MORPHINE SULFATE ER 60 MG PO TBCR: 60.0000 mg | EXTENDED_RELEASE_TABLET | Freq: Two times a day (BID) | ORAL | 0 refills | Status: DC

## 2024-09-07 NOTE — Transitions of Care (Post Inpatient/ED Visit) (Signed)
   09/07/2024  Name: Carmen [name] MRN: [id number] DOB: May 07, 1991  Today's TOC FU Call Status: Today's TOC FU Call Status:: Unsuccessful Call (3rd Attempt) Unsuccessful Call (3rd Attempt) Date: 09/07/24  Attempted to reach the patient regarding the most recent Inpatient/ED visit.  Follow Up Plan: No further outreach attempts will be made at this time. We have been unable to contact the patient.  Arvin Seip RN, BSN, CCM [employer], Population Health Case Manager Phone: [phone]

## 2024-09-07 NOTE — Progress Notes (Signed)
 Reviewed PDMP substance reporting system prior to prescribing opiate medications. No inconsistencies noted.   1. 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  Dispense: 60 tablet; Refill: 0  2. Chronic prescription opiate use  - morphine (MS CONTIN) 60 MG 12 hr tablet; Take 1 tablet (60 mg total) by mouth every 12 (twelve) hours.  Dispense: 60 tablet; Refill: 0

## 2024-09-12 ENCOUNTER — Ambulatory Visit (INDEPENDENT_AMBULATORY_CARE_PROVIDER_SITE_OTHER): Payer: MEDICAID | Admitting: Psychiatry

## 2024-09-12 ENCOUNTER — Encounter (HOSPITAL_COMMUNITY): Payer: Self-pay | Admitting: Psychiatry

## 2024-09-12 DIAGNOSIS — F332 Major depressive disorder, recurrent severe without psychotic features: Secondary | ICD-10-CM

## 2024-09-12 DIAGNOSIS — F411 Generalized anxiety disorder: Secondary | ICD-10-CM

## 2024-09-12 MED ORDER — QUETIAPINE FUMARATE 25 MG PO TABS: 25.0000 mg | ORAL_TABLET | Freq: Every day | ORAL | 3 refills | Status: DC

## 2024-09-12 MED ORDER — GABAPENTIN 100 MG PO CAPS: 100.0000 mg | ORAL_CAPSULE | Freq: Three times a day (TID) | ORAL | 3 refills | Status: AC

## 2024-09-12 NOTE — Progress Notes (Signed)
 Psychiatric Initial Adult Assessment  Virtual Visit via Video Note  I connected with Carmen [name] on 09/12/24 at 11:00 AM EST by a video enabled telemedicine application and verified that I am speaking with the correct person using two identifiers.  Location: Patient: Home Provider: Clinic   I discussed the limitations of evaluation and management by telemedicine and the availability of in person appointments. The patient expressed understanding and agreed to proceed.  I provided 45 minutes of non-face-to-face time during this encounter.    Patient Identification: Carmen [name] MRN:  [id number] Date of Evaluation:  09/12/2024 Referral Source: Shasta Cote, NP Chief Complaint: I think I am severely anxious and depressed Visit Diagnosis:    ICD-10-CM   1. Severe episode of recurrent major depressive disorder, without psychotic features (HCC)  F33.2 QUEtiapine (SEROQUEL) 25 MG tablet    gabapentin (NEURONTIN) 100 MG capsule    2. Generalized anxiety disorder  F41.1 QUEtiapine (SEROQUEL) 25 MG tablet    gabapentin (NEURONTIN) 100 MG capsule      History of Present Illness: 33 year old female seen today for initial psychiatric evaluation.  She was referred to outpatient psychiatry by Shasta Cote, NP for medication management.  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  Patient have medical history of sickle cell anemia and chronic pain.  Currently she is managed on Prozac  40 mg and NicoDerm CQ  14 mg patches which she received from her PCP.  She informed clinical research associate that her medications has not been effective in managing her psychiatric conditions.  She informed clinical research associate that she has also tried citalopram  and Zoloft without success.  Today she is well-groomed, pleasant, cooperative, and engaged in conversation.  Patient informed clinical research associate that she believes she is severely anxious and depressed.  She informed clinical research associate that her chronic pain  exacerbates her mental health.  She also notes that the death of her sister (who also had sickle cell) caused her depression to increase.  Patient notes that the final straw occurred in September 2025 when her cousin called the police on her and she was charged for breaking and entering/destruction of property.  Patient informed clinical research associate that her charges were falsified.  She is hopeful that her public defender will be able to have her charges dropped that she does not want to impact her life.  Because of the charges she notes that CPS is looking into her parenting.  Patient notes that she has a 34-year-old son who has autism.  She notes that she not only cares for him but also her mother.  Patient reports that the above exacerbates her anxiety and depression.  Today provider conducted a GAD-7 and patient scored 21.  Provider also conducted PHQ-9 and patient scored a 24.  She notes that her sleep and appetite is poor.  Patient informed writer that over the last couple of months she has lost over 30 pounds.  Today she denies SI/HI/AVH, mania, paranoia.  She does note at times she feels irritable.  Patient denies alcohol use.  She notes that she smokes a pack of cigarettes every 3 to 4 days.  She informed clinical research associate that she will engage in marijuana use when she is around her family but notes that this is infrequent.  Patient informed clinical research associate that she found antidepressants ineffective in the past.  She has been on Prozac  for over 2 months but notes that she continues to be depressed.  Today she is agreeable to starting Seroquel 25 mg to  help manage sleep, anxiety, and depression.  He is also agreeable to starting gabapentin 100 mg 3 times daily to help manage pain and anxiety.Potential side effects of medication and risks vs benefits of treatment vs non-treatment were explained and discussed. All questions were answered.  No other concerns noted at this time.   Associated Signs/Symptoms: Depression Symptoms:   depressed mood, psychomotor agitation, fatigue, feelings of worthlessness/guilt, difficulty concentrating, (Hypo) Manic Symptoms:  Flight of Ideas, Licensed Conveyancer, Impulsivity, Irritable Mood, Anxiety Symptoms:  Excessive Worry, Psychotic Symptoms:  Denies PTSD Symptoms: Had a traumatic exposure:  Lost sister who was 54 of sickle cell. Also notes that she has a breaking an entering charge which she notes was falsified by her cousin  Past Psychiatric History: Anxiety, depression, Cluster B personality disorder  Previous Psychotropic Medications: Citalopram , Zoloft, Prozac  and Nicoderm patch  Substance Abuse History in the last 12 months:  Yes.    Consequences of Substance Abuse: NA  Past Medical History:  Past Medical History:  Diagnosis Date   Anxiety    Headache(784.0)    Heart murmur    Sickle cell crisis (HCC)    Syphilis 2015   Was diagnosed and received one injection of antibiotics   Thrombocytosis 11/22/2014     CBC Latest Ref Rng & Units 12/13/2018 12/11/2018 12/10/2018 WBC 4.0 - 10.5 K/uL 14.8(H) 13.2(H) 15.9(H) Hemoglobin 12.0 - 15.0 g/dL 8.2(L) 7.7(L) 8.1(L) Hematocrit 36.0 - 46.0 % 23.7(L) 23.2(L) 24.5(L) Platelets 150 - 400 K/uL 326 399 388      Past Surgical History:  Procedure Laterality Date   CESAREAN SECTION N/A 02/05/2019   Procedure: CESAREAN SECTION;  Surgeon: Lorence Ozell CROME, MD;  Location: [facility];  Service: Obstetrics;  Laterality: N/A;   CHOLECYSTECTOMY N/A 11/30/2014   Procedure: LAPAROSCOPIC CHOLECYSTECTOMY SINGLE SITE WITH INTRAOPERATIVE CHOLANGIOGRAM;  Surgeon: Elspeth Schultze, MD;  Location: [facility] ORS;  Service: General;  Laterality: N/A;   SPLENECTOMY      Family Psychiatric History: Son Autism, depression and anxiety mother, siblings (two sisters) depression  Family History:  Family History  Problem Relation Age of Onset   Hypertension Mother    Sickle cell anemia Sister    Kidney disease Sister        Lupus   Arthritis Sister     Sickle cell anemia Sister    Sickle cell trait Sister    Heart disease Maternal Aunt        CABG   Heart disease Maternal Uncle        CABG   Lupus Sister     Social History:   Social History   Socioeconomic History   Marital status: Single    Spouse name: Not on file   Number of children: 1   Years of education: Not on file   Highest education level: 12th grade  Occupational History   Not on file  Tobacco Use   Smoking status: Some Days    Types: Cigarettes   Smokeless tobacco: Never  Vaping Use   Vaping status: Never Used  Substance and Sexual Activity   Alcohol use: No   Drug use: Yes    Types: Marijuana    Comment: marijuana   Sexual activity: Yes    Partners: Male    Birth control/protection: None    Comment: menarche 33yo, sexual debut 33yo  Other Topics Concern   Not on file  Social History Narrative   Lives with her mother and her son   Social Drivers of Health  Financial Resource Strain: Low Risk  (08/28/2024)   Overall Financial Resource Strain (CARDIA)    Difficulty of Paying Living Expenses: Not very hard  Food Insecurity: Food Insecurity Present (09/01/2024)   Hunger Vital Sign    Worried About Running Out of Food in the Last Year: Sometimes true    Ran Out of Food in the Last Year: Sometimes true  Transportation Needs: Unmet Transportation Needs (09/01/2024)   PRAPARE - Administrator, Civil Service (Medical): Yes    Lack of Transportation (Non-Medical): Yes  Physical Activity: Sufficiently Active (08/28/2024)   Exercise Vital Sign    Days of Exercise per Week: 3 days    Minutes of Exercise per Session: 60 min  Stress: Stress Concern Present (08/28/2024)   [employer] of Occupational Health - Occupational Stress Questionnaire    Feeling of Stress: Very much  Social Connections: Socially Isolated (08/28/2024)   Social Connection and Isolation Panel    Frequency of Communication with Friends and Family: More than three times a  week    Frequency of Social Gatherings with Friends and Family: Twice a week    Attends Religious Services: Patient declined    Database Administrator or Organizations: No    Attends Engineer, Structural: Not on file    Marital Status: Never married    Additional Social History: Patient resides in [place] with her mother and son. She receives SSI. She is single. She reports that she smokes 1 pack of cigarettes every 3-4 days. She smokes marijuana socially. She denies alcohol use.  Allergies:   Allergies  Allergen Reactions   Ketamine  Hives, Nausea And Vomiting and Other (See Comments)    Makes the patient feel funny also    Metabolic Disorder Labs: No results found for: HGBA1C, MPG No results found for: PROLACTIN Lab Results  Component Value Date   CHOL 142 05/28/2024   TRIG 78 05/28/2024   HDL 40 05/28/2024   CHOLHDL 3.6 05/28/2024   LDLCALC 87 05/28/2024   No results found for: TSH  Therapeutic Level Labs: No results found for: LITHIUM No results found for: CBMZ No results found for: VALPROATE  Current Medications: Current Outpatient Medications  Medication Sig Dispense Refill   gabapentin (NEURONTIN) 100 MG capsule Take 1 capsule (100 mg total) by mouth 3 (three) times daily. 90 capsule 3   QUEtiapine (SEROQUEL) 25 MG tablet Take 1 tablet (25 mg total) by mouth at bedtime. 30 tablet 3   cyclobenzaprine  (FLEXERIL ) 10 MG tablet TAKE 1 TABLET BY MOUTH DAILY AT BEDTIME AS NEEDED FOR MUSCLE SPASMS (Patient taking differently: Take 10 mg by mouth daily as needed for muscle spasms.) 30 tablet 2   FLUoxetine  (PROZAC ) 40 MG capsule Take 1 capsule (40 mg total) by mouth daily. 60 capsule 3   folic acid  (FOLVITE ) 1 MG tablet Take 1 tablet (1 mg total) by mouth daily. 90 tablet 3   lidocaine  (LIDODERM ) 5 % Place 1 patch onto the skin daily. Remove & Discard patch within 12 hours or as directed by MD 30 patch 0   morphine  (MS CONTIN ) 60 MG 12 hr tablet  Take 1 tablet (60 mg total) by mouth every 12 (twelve) hours. 60 tablet 0   naloxone  (NARCAN ) nasal spray 4 mg/0.1 mL 1 nasal spray in one nostril as needed for opoid overdose 1 each 2   nicotine  (NICODERM CQ  - DOSED IN MG/24 HOURS) 14 mg/24hr patch Place 1 patch (14 mg total) onto the skin  daily. (Patient not taking: Reported on 09/01/2024) 42 patch 0   Oxycodone  HCl 10 MG TABS Take 1 tablet (10 mg total) by mouth every 6 (six) hours as needed (pain). 60 tablet 0   Vitamin D , Ergocalciferol , (DRISDOL ) 1.25 MG (50000 UNIT) CAPS capsule TAKE 1 CAPSULE BY MOUTH ONE TIME PER WEEK (Patient taking differently: Take 50,000 Units by mouth every 7 (seven) days. Every Wednesday) 12 capsule 4   No current facility-administered medications for this visit.    Musculoskeletal: Strength & Muscle Tone: within normal limits and Telehealth visit Gait & Station: normal, Telehealth visit Patient leans: N/A  Psychiatric Specialty Exam: Review of Systems  Last menstrual period 09/03/2024.There is no height or weight on file to calculate BMI.  General Appearance: Well Groomed  Eye Contact:  Good  Speech:  Clear and Coherent and Normal Rate  Volume:  Normal  Mood:  Anxious and Depressed  Affect:  Appropriate and Congruent  Thought Process:  Coherent, Goal Directed, and Linear  Orientation:  Full (Time, Place, and Person)  Thought Content:  WDL and Logical  Suicidal Thoughts:  No  Homicidal Thoughts:  No  Memory:  Immediate;   Good Recent;   Good Remote;   Good  Judgement:  Good  Insight:  Good  Psychomotor Activity:  Normal  Concentration:  Concentration: Good and Attention Span: Good  Recall:  Good  Fund of Knowledge:Good  Language: Good  Akathisia:  No  Handed:  Right  AIMS (if indicated):  not done  Assets:  Communication Skills Desire for Improvement Financial Resources/Insurance Housing Leisure Time Physical Health Social Support Transportation  ADL's:  Intact  Cognition: WNL  Sleep:   Fair   Screenings: GAD-7    Flowsheet Row Office Visit from 09/12/2024 in [facility] Office Visit from 08/28/2024 in [facility] Dept Of [facility] Counselor from 08/20/2024 in [facility] Office Visit from 05/28/2024 in [facility] Of [facility] Office Visit from 02/24/2024 in [facility] Dept Of [facility]  Total GAD-7 Score 21 19 21 7  0   PHQ2-9    Flowsheet Row Office Visit from 09/12/2024 in [facility] Office Visit from 08/28/2024 in [facility] Of [facility] Counselor from 08/20/2024 in [facility] Office Visit from 07/20/2024 in [facility] Office Visit from 05/28/2024 in [facility] Dept Of [facility]  PHQ-2 Total Score 6 6 5 6  0  PHQ-9 Total Score 24 22 23 24 2    Flowsheet Row Office Visit from 09/12/2024 in [facility] Most recent reading at 09/12/2024 11:14 AM ED from 09/06/2024 in [facility] Emergency Department at [facility] Most recent reading at 09/06/2024 11:37 AM ED from 09/06/2024 in [facility] Emergency Department at [facility] Most recent reading at 09/06/2024  9:53 AM  C-SSRS RISK CATEGORY No Risk No Risk No Risk    Assessment and Plan: Patient endorses increased anxiety, depression, and poor sleep. She has found antidepressants ineffective. Today she is agreeable to start Seroquel 100 mg to help manage sleep, anxiety, and depression.  1. Severe episode of recurrent major depressive disorder, without psychotic features (HCC) (Primary)  Start- QUEtiapine (SEROQUEL)  25 MG tablet; Take 1 tablet (25 mg total) by mouth at bedtime.  Dispense: 30 tablet; Refill: 3 Start-  gabapentin (NEURONTIN) 100 MG capsule; Take 1 capsule (100 mg total) by mouth 3 (three) times daily.  Dispense: 90 capsule; Refill: 3  2. Generalized anxiety disorder  Start- QUEtiapine (SEROQUEL) 25 MG tablet; Take 1 tablet (25 mg total) by mouth at bedtime.  Dispense: 30 tablet; Refill: 3 Start- gabapentin (NEURONTIN) 100 MG capsule; Take 1 capsule (100 mg total) by mouth 3 (three) times daily.  Dispense: 90 capsule; Refill: 3   Collaboration of Care: Other provider involved in patient's care AEB PCP  Patient/Guardian was advised Release of Information must be obtained prior to any record release in order to collaborate their care with an outside provider. Patient/Guardian was advised if they have not already done so to contact the registration department to sign all necessary forms in order for us  to release information regarding their care.   Consent: Patient/Guardian gives verbal consent for treatment and assignment of benefits for services provided during this visit. Patient/Guardian expressed understanding and agreed to proceed.   Follow-up in 2.5 months  Zane FORBES Bach, NP 11/12/202511:59 AM

## 2024-09-14 ENCOUNTER — Telehealth: Payer: Self-pay | Admitting: Nurse Practitioner

## 2024-09-14 ENCOUNTER — Other Ambulatory Visit: Payer: Self-pay | Admitting: Nurse Practitioner

## 2024-09-14 DIAGNOSIS — D571 Sickle-cell disease without crisis: Secondary | ICD-10-CM

## 2024-09-14 DIAGNOSIS — Z79891 Long term (current) use of opiate analgesic: Secondary | ICD-10-CM

## 2024-09-14 MED ORDER — OXYCODONE HCL 10 MG PO TABS: 10.0000 mg | ORAL_TABLET | Freq: Four times a day (QID) | ORAL | 0 refills | Status: DC | PRN

## 2024-09-14 NOTE — Telephone Encounter (Signed)
 Caller & Relationship to patient:   MRN #  [id number]   Call Back Number:   Date of Last Office Visit: 09/06/2024     Date of Next Office Visit: 11/28/2024    Medication(s) to be Refilled: Oxycodone  10mg   Preferred Pharmacy:   ** Please notify patient to allow 48-72 hours to process** **Let patient know to contact pharmacy at the end of the day to make sure medication is ready. ** **If patient has not been seen in a year or longer, book an appointment **Advise to use MyChart for refill requests OR to contact their pharmacy

## 2024-09-14 NOTE — Progress Notes (Signed)
 Reviewed PDMP substance reporting system prior to prescribing opiate medications. No inconsistencies noted.     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  Dispense: 60 tablet; Refill: 0  2. Chronic prescription opiate use  - Oxycodone HCl 10 MG TABS; Take 1 tablet (10 mg total) by mouth every 6 (six) hours as needed (pain).  Dispense: 60 tablet; Refill: 0

## 2024-10-01 ENCOUNTER — Encounter (HOSPITAL_COMMUNITY): Payer: Self-pay

## 2024-10-01 ENCOUNTER — Emergency Department (HOSPITAL_COMMUNITY): Payer: MEDICAID

## 2024-10-01 ENCOUNTER — Telehealth (HOSPITAL_COMMUNITY): Payer: Self-pay | Admitting: *Deleted

## 2024-10-01 ENCOUNTER — Other Ambulatory Visit: Payer: Self-pay

## 2024-10-01 ENCOUNTER — Inpatient Hospital Stay (HOSPITAL_COMMUNITY)
Admission: EM | Admit: 2024-10-01 | Discharge: 2024-10-07 | DRG: 812 | Disposition: A | Payer: MEDICAID | Attending: Student in an Organized Health Care Education/Training Program | Admitting: Student in an Organized Health Care Education/Training Program

## 2024-10-01 DIAGNOSIS — D5700 Hb-SS disease with crisis, unspecified: Secondary | ICD-10-CM | POA: Diagnosis not present

## 2024-10-01 DIAGNOSIS — F411 Generalized anxiety disorder: Secondary | ICD-10-CM | POA: Diagnosis present

## 2024-10-01 DIAGNOSIS — G8929 Other chronic pain: Secondary | ICD-10-CM | POA: Diagnosis present

## 2024-10-01 DIAGNOSIS — D571 Sickle-cell disease without crisis: Secondary | ICD-10-CM

## 2024-10-01 DIAGNOSIS — D638 Anemia in other chronic diseases classified elsewhere: Secondary | ICD-10-CM | POA: Diagnosis present

## 2024-10-01 DIAGNOSIS — R52 Pain, unspecified: Secondary | ICD-10-CM

## 2024-10-01 DIAGNOSIS — Z79891 Long term (current) use of opiate analgesic: Secondary | ICD-10-CM

## 2024-10-01 DIAGNOSIS — F17200 Nicotine dependence, unspecified, uncomplicated: Secondary | ICD-10-CM | POA: Diagnosis present

## 2024-10-01 LAB — COMPREHENSIVE METABOLIC PANEL WITH GFR
ALT: 19 U/L (ref 0–44)
AST: 25 U/L (ref 15–41)
Albumin: 3.5 g/dL (ref 3.5–5.0)
Alkaline Phosphatase: 65 U/L (ref 38–126)
Anion gap: 8 (ref 5–15)
BUN: 7 mg/dL (ref 6–20)
CO2: 26 mmol/L (ref 22–32)
Calcium: 8.8 mg/dL — ABNORMAL LOW (ref 8.9–10.3)
Chloride: 105 mmol/L (ref 98–111)
Creatinine, Ser: 0.57 mg/dL (ref 0.44–1.00)
GFR, Estimated: >60 mL/min (ref >60)
Glucose, Bld: 83 mg/dL (ref 70–99)
Potassium: 3.9 mmol/L (ref 3.5–5.1)
Sodium: 139 mmol/L (ref 135–145)
Total Bilirubin: 1.3 mg/dL — ABNORMAL HIGH (ref 0.0–1.2)
Total Protein: 7.1 g/dL (ref 6.5–8.1)

## 2024-10-01 LAB — CBC WITH DIFFERENTIAL/PLATELET
Abs Immature Granulocytes: 0.09 K/uL — ABNORMAL HIGH (ref 0.00–0.07)
Basophils Absolute: 0 K/uL (ref 0.0–0.1)
Basophils Relative: 0 %
Eosinophils Absolute: 0.2 K/uL (ref 0.0–0.5)
Eosinophils Relative: 2 %
HCT: 28.4 % — ABNORMAL LOW (ref 36.0–46.0)
Hemoglobin: 10 g/dL — ABNORMAL LOW (ref 12.0–15.0)
Immature Granulocytes: 1 %
Lymphocytes Relative: 42 %
Lymphs Abs: 4.1 K/uL — ABNORMAL HIGH (ref 0.7–4.0)
MCH: 29.9 pg (ref 26.0–34.0)
MCHC: 35.2 g/dL (ref 30.0–36.0)
MCV: 85 fL (ref 80.0–100.0)
Monocytes Absolute: 0.9 K/uL (ref 0.1–1.0)
Monocytes Relative: 9 %
Neutro Abs: 4.4 K/uL (ref 1.7–7.7)
Neutrophils Relative %: 46 %
Platelets: 523 K/uL — ABNORMAL HIGH (ref 150–400)
RBC: 3.34 MIL/uL — ABNORMAL LOW (ref 3.87–5.11)
RDW: 16.6 % — ABNORMAL HIGH (ref 11.5–15.5)
WBC: 9.7 K/uL (ref 4.0–10.5)
nRBC: 2 % — ABNORMAL HIGH (ref 0.0–0.2)

## 2024-10-01 LAB — RETICULOCYTES
Immature Retic Fract: 39.1 % — ABNORMAL HIGH (ref 2.3–15.9)
RBC.: 3.31 MIL/uL — ABNORMAL LOW (ref 3.87–5.11)
Retic Count, Absolute: 231 K/uL — ABNORMAL HIGH (ref 19.0–186.0)
Retic Ct Pct: 7 % — ABNORMAL HIGH (ref 0.4–3.1)

## 2024-10-01 LAB — HCG, SERUM, QUALITATIVE: Preg, Serum: NEGATIVE

## 2024-10-01 MED ORDER — GABAPENTIN 100 MG PO CAPS
100.0000 mg | ORAL_CAPSULE | Freq: Three times a day (TID) | ORAL | Status: DC
  Administered 2024-10-01 – 2024-10-07 (×17): 100 mg via ORAL
  Filled 2024-10-01 (×17): qty 1

## 2024-10-01 MED ORDER — HYDROMORPHONE HCL 1 MG/ML IJ SOLN
2.0000 mg | INTRAMUSCULAR | Status: AC
  Administered 2024-10-01: 2 mg via INTRAVENOUS
  Filled 2024-10-01: qty 2

## 2024-10-01 MED ORDER — HYDROMORPHONE HCL 1 MG/ML IJ SOLN
1.0000 mg | Freq: Once | INTRAMUSCULAR | Status: AC
  Administered 2024-10-01: 1 mg via INTRAVENOUS
  Filled 2024-10-01: qty 1

## 2024-10-01 MED ORDER — HYDROMORPHONE HCL 1 MG/ML IJ SOLN
2.0000 mg | Freq: Once | INTRAMUSCULAR | Status: AC
  Administered 2024-10-01: 2 mg via INTRAVENOUS
  Filled 2024-10-01: qty 2

## 2024-10-01 MED ORDER — KETOROLAC TROMETHAMINE 15 MG/ML IJ SOLN
15.0000 mg | INTRAMUSCULAR | Status: AC
  Administered 2024-10-01: 15 mg via INTRAVENOUS
  Filled 2024-10-01: qty 1

## 2024-10-01 MED ORDER — FLUOXETINE HCL 20 MG PO CAPS
40.0000 mg | ORAL_CAPSULE | Freq: Every day | ORAL | Status: DC
  Administered 2024-10-02 – 2024-10-07 (×6): 40 mg via ORAL
  Filled 2024-10-01 (×6): qty 2

## 2024-10-01 MED ORDER — POLYETHYLENE GLYCOL 3350 17 G PO PACK
17.0000 g | PACK | Freq: Every day | ORAL | Status: DC
  Administered 2024-10-02 – 2024-10-06 (×5): 17 g via ORAL
  Filled 2024-10-01 (×6): qty 1

## 2024-10-01 MED ORDER — HYDROCODONE-ACETAMINOPHEN 5-325 MG PO TABS: 1.0000 | ORAL_TABLET | ORAL | Status: DC | PRN

## 2024-10-01 MED ORDER — NICOTINE 7 MG/24HR TD PT24: 7.0000 mg | MEDICATED_PATCH | Freq: Every day | TRANSDERMAL | Status: DC | PRN

## 2024-10-01 MED ORDER — ONDANSETRON HCL 4 MG/2ML IJ SOLN: 4.0000 mg | INTRAMUSCULAR | Status: DC | PRN

## 2024-10-01 MED ORDER — ENOXAPARIN SODIUM 40 MG/0.4ML IJ SOSY
40.0000 mg | PREFILLED_SYRINGE | INTRAMUSCULAR | Status: DC
  Filled 2024-10-01 (×4): qty 0.4

## 2024-10-01 MED ORDER — ACETAMINOPHEN 325 MG PO TABS: 650.0000 mg | ORAL_TABLET | Freq: Four times a day (QID) | ORAL | Status: DC | PRN

## 2024-10-01 MED ORDER — HYDROCODONE-ACETAMINOPHEN 5-325 MG PO TABS
2.0000 | ORAL_TABLET | ORAL | Status: DC | PRN
  Administered 2024-10-01 – 2024-10-02 (×2): 2 via ORAL
  Filled 2024-10-01 (×2): qty 2

## 2024-10-01 MED ORDER — SODIUM CHLORIDE 0.45 % IV SOLN: INTRAVENOUS | Status: AC

## 2024-10-01 MED ORDER — QUETIAPINE FUMARATE 25 MG PO TABS
25.0000 mg | ORAL_TABLET | Freq: Every day | ORAL | Status: DC
  Administered 2024-10-01 – 2024-10-06 (×6): 25 mg via ORAL
  Filled 2024-10-01 (×6): qty 1

## 2024-10-01 MED ORDER — HYDROMORPHONE HCL 1 MG/ML IJ SOLN
1.0000 mg | INTRAMUSCULAR | Status: DC | PRN
  Administered 2024-10-01: 1 mg via INTRAVENOUS
  Filled 2024-10-01: qty 1

## 2024-10-01 MED ORDER — BISACODYL 5 MG PO TBEC: 5.0000 mg | DELAYED_RELEASE_TABLET | Freq: Every day | ORAL | Status: DC | PRN

## 2024-10-01 MED ORDER — ACETAMINOPHEN 650 MG RE SUPP: 650.0000 mg | Freq: Four times a day (QID) | RECTAL | Status: DC | PRN

## 2024-10-01 MED ORDER — ALBUTEROL SULFATE (2.5 MG/3ML) 0.083% IN NEBU: 2.5000 mg | INHALATION_SOLUTION | RESPIRATORY_TRACT | Status: DC | PRN

## 2024-10-01 MED ORDER — DIPHENHYDRAMINE HCL 25 MG PO CAPS: 25.0000 mg | ORAL_CAPSULE | ORAL | Status: DC | PRN

## 2024-10-01 NOTE — ED Notes (Signed)
 Report called to [facility] for transfer to [place] rm 1610

## 2024-10-01 NOTE — ED Notes (Signed)
 Pt. Described her pain as an 9/10; EDP notified; Pt. Requesting more pain meds

## 2024-10-01 NOTE — ED Notes (Signed)
 Phlebotomy at [facility] to get labs; This RN attempted to pull blood off of IV team's IV, but was unsuccessful

## 2024-10-01 NOTE — Telephone Encounter (Signed)
 Patient called in. Complains of pain in right arm and shoulder rates 9/10. Denied chest pain, abd pain, fever, N/V/D. Wants to come in for treatment. Last took Oxycodone  and morphine   at 2 am and 9 pm as written.  Denies being seen at  ED and any other symptoms.  Spoke with Provider, we are full today I called and informed patient she may call at 8 am tomorrow to see if a spot is available and to try and manage pain at home today.  She verbalized understanding.

## 2024-10-01 NOTE — ED Provider Notes (Signed)
 Care transferred to me.  DVT ultrasound is negative.  Labs unremarkable compared to baseline.  Troponin is pending and not yet being run by the lab but it seems unlikely this is an ACS presentation.  Pain is still poorly controlled, will need admission for pain control.  There is no actual weakness, just pain, despite the triage chief complaint.  Discussed with Dr. Lenon for admission.   Freddi Hamilton, MD 10/01/24 [phone]

## 2024-10-01 NOTE — Progress Notes (Signed)
 RUE venous duplex has been completed.  Preliminary results given to Dr. Freddi.   Results can be found under chart review under CV PROC. 10/01/2024 6:04 PM Llesenia Fogal RVT, RDMS

## 2024-10-01 NOTE — ED Triage Notes (Signed)
 C/o left arm pain onset 2 days ago , states she has Sickle Cell states she called her doctor and was told to come to ed

## 2024-10-01 NOTE — ED Provider Notes (Signed)
 [place] EMERGENCY DEPARTMENT AT [facility] Provider Note   CSN: [id number] Arrival date & time: 10/01/24  1122     Patient presents with: Extremity Weakness   Carmen [name] is a 33 y.o. female.  {Add pertinent medical, surgical, social history, OB history to HPI:32947} HPI      2 days ago right arm pain Was able to get it controlled at first with home meds but last night worsened, took morphin 60mg  at 9PM, oxycodone  at 11PM, and then 3AM toko another oxycodone . 2 days ago was only getting worse, called sickle cell clinic which was all booked.  Recommended if she could wait could but pain worsened and came.  Feels like sickle cell pain, has not had crisis in arm for years, feels typical Not sure if cold temperatures  No fever, no chest pain or dyspnea, no abd pain nausea or vomiting  Recently over a cold with cough and congestion, not now  Past Medical History:  Diagnosis Date   Anxiety    Headache(784.0)    Heart murmur    Sickle cell crisis (HCC)    Syphilis 2015   Was diagnosed and received one injection of antibiotics   Thrombocytosis 11/22/2014     CBC Latest Ref Rng & Units 12/13/2018 12/11/2018 12/10/2018 WBC 4.0 - 10.5 K/uL 14.8(H) 13.2(H) 15.9(H) Hemoglobin 12.0 - 15.0 g/dL 8.2(L) 7.7(L) 8.1(L) Hematocrit 36.0 - 46.0 % 23.7(L) 23.2(L) 24.5(L) Platelets 150 - 400 K/uL 326 399 388      Prior to Admission medications   Medication Sig Start Date End Date Taking? Authorizing Provider  cyclobenzaprine  (FLEXERIL ) 10 MG tablet TAKE 1 TABLET BY MOUTH DAILY AT BEDTIME AS NEEDED FOR MUSCLE SPASMS Patient taking differently: Take 10 mg by mouth daily as needed for muscle spasms. 07/30/24   Paseda, Folashade R, FNP  FLUoxetine  (PROZAC ) 40 MG capsule Take 1 capsule (40 mg total) by mouth daily. 08/28/24   Paseda, Folashade R, FNP  folic acid  (FOLVITE ) 1 MG tablet Take 1 tablet (1 mg total) by mouth daily. 12/27/23   Paseda, Folashade R, FNP  gabapentin  (NEURONTIN ) 100  MG capsule Take 1 capsule (100 mg total) by mouth 3 (three) times daily. 09/12/24   Harl Zane BRAVO, NP  lidocaine  (LIDODERM ) 5 % Place 1 patch onto the skin daily. Remove & Discard patch within 12 hours or as directed by MD 09/06/24   Kingsley, Victoria K, DO  morphine  (MS CONTIN ) 60 MG 12 hr tablet Take 1 tablet (60 mg total) by mouth every 12 (twelve) hours. 09/08/24   Paseda, Folashade R, FNP  naloxone  (NARCAN ) nasal spray 4 mg/0.1 mL 1 nasal spray in one nostril as needed for opoid overdose 05/28/24   Paseda, Folashade R, FNP  nicotine  (NICODERM CQ  - DOSED IN MG/24 HOURS) 14 mg/24hr patch Place 1 patch (14 mg total) onto the skin daily. Patient not taking: Reported on 09/01/2024 08/28/24   Paseda, Folashade R, FNP  Oxycodone  HCl 10 MG TABS Take 1 tablet (10 mg total) by mouth every 6 (six) hours as needed (pain). 09/20/24   Paseda, Folashade R, FNP  QUEtiapine  (SEROQUEL ) 25 MG tablet Take 1 tablet (25 mg total) by mouth at bedtime. 09/12/24   Harl Zane E, NP  Vitamin D , Ergocalciferol , (DRISDOL ) 1.25 MG (50000 UNIT) CAPS capsule TAKE 1 CAPSULE BY MOUTH ONE TIME PER WEEK Patient taking differently: Take 50,000 Units by mouth every 7 (seven) days. Every Wednesday 12/27/23   Paseda, Folashade R, FNP  Allergies: Ketamine     Review of Systems  Updated Vital Signs BP 119/76   Pulse 98   Temp 98.1 F (36.7 C)   Resp 16   Ht 5' 3 (1.6 m)   Wt 79.4 kg   LMP 09/24/2024 (Approximate)   SpO2 95%   BMI 31.00 kg/m   Physical Exam  (all labs ordered are listed, but only abnormal results are displayed) Labs Reviewed  COMPREHENSIVE METABOLIC PANEL WITH GFR  CBC WITH DIFFERENTIAL/PLATELET  RETICULOCYTES  HCG, SERUM, QUALITATIVE    EKG: None  Radiology: No results found.  {Document cardiac monitor, telemetry assessment procedure when appropriate:32947} Procedures   Medications Ordered in the ED  HYDROmorphone  (DILAUDID ) injection 1 mg (1 mg Intravenous Given 10/01/24 1419)       {Click here for ABCD2, HEART and other calculators REFRESH Note before signing:1}                              Medical Decision Making Amount and/or Complexity of Data Reviewed Labs: ordered.  Risk Prescription drug management.   ***  {Document critical care time when appropriate  Document review of labs and clinical decision tools ie CHADS2VASC2, etc  Document your independent review of radiology images and any outside records  Document your discussion with family members, caretakers and with consultants  Document social determinants of health affecting pt's care  Document your decision making why or why not admission, treatments were needed:32947:::1}   Final diagnoses:  None    ED Discharge Orders     None

## 2024-10-01 NOTE — ED Notes (Signed)
IV Team at [facility].

## 2024-10-01 NOTE — ED Notes (Signed)
 Patient transported to Ultrasound

## 2024-10-01 NOTE — ED Notes (Signed)
[facility] called for transport to [facility] 

## 2024-10-01 NOTE — ED Triage Notes (Signed)
 Denies CP and SHOB

## 2024-10-01 NOTE — H&P (Signed)
 History and Physical  Carmen [name] FMW:[id number] DOB: Feb 06, 1991 DOA: 10/01/2024 PCP: Paseda, Folashade R, FNP  Chief Complaint: pain  Historian: patient  HPI:  Carmen [name] is a 33 y.o. female with a PMH significant for sickle cell disease, depression, GAD. At baseline, they live independently and are independent with their ADLs.  They presented from home to the ED on 10/01/2024 with right shoulder and arm pain x 2 days.  Patient states that she took her home pain medications including 60 mg morphine  and 5 mg oxycodone  as instructed when she has flareup of her pain crises which initially improved her pain but as of this morning became intractable.  Pain initiated in her right shoulder and has since spread to her entire right arm.  Denies any chest pain or shortness of breath.  Her pain crisis location differs but has had flareups in the same location in the past.  She states that she has a crisis approximately 1-2 times per month which can be triggered by menstruation or stress.  She is currently menstruating. She did call and attempt to be seen at her sickle cell clinic but they had no availability and recommended being seen at the emergency department.  In the ED, it was found that they had stable vital signs on room air.  Significant findings included: Stable metabolic panel.  CBC consistent with her baseline readings: WBC 9.7, hemoglobin 10.0, platelets 523.  Absolute reticulocyte count 231.  They were initially treated with several doses of Dilaudid , Toradol .   Patient was admitted to medicine service for further workup and management of sickle cell pain crisis as outlined in detail below.  Assessment/Plan Active Problems:   Sickle cell pain crisis (HCC)   Sickle cell pain crisis-typical pattern which was not responding to home regimen of 60 mg oral morphine  and 10 mg oxycodone .  Denies chest pain or shortness of breath and is stable on room air.  Chest exam without concerns.  No  significant swelling of right arm. - Continue as needed IV pain medications - Continue Norco PRN - Ad[facility] to see sickle cell team a.m. - Continue IV fluids - Heating pad as needed  Thrombocytosis-platelets mildly elevated from baseline at 523.  Chronically elevated - CBC a.m.  Depression  anxiety-not acutely exacerbated - Continue home fluoxetine , gabapentin , quetiapine   Past Medical History:  Diagnosis Date   Anxiety    Headache(784.0)    Heart murmur    Sickle cell crisis (HCC)    Syphilis 2015   Was diagnosed and received one injection of antibiotics   Thrombocytosis 11/22/2014     CBC Latest Ref Rng & Units 12/13/2018 12/11/2018 12/10/2018 WBC 4.0 - 10.5 K/uL 14.8(H) 13.2(H) 15.9(H) Hemoglobin 12.0 - 15.0 g/dL 8.2(L) 7.7(L) 8.1(L) Hematocrit 36.0 - 46.0 % 23.7(L) 23.2(L) 24.5(L) Platelets 150 - 400 K/uL 326 399 388     Past Surgical History:  Procedure Laterality Date   CESAREAN SECTION N/A 02/05/2019   Procedure: CESAREAN SECTION;  Surgeon: Lorence Ozell CROME, MD;  Location: [facility] LD ORS;  Service: Obstetrics;  Laterality: N/A;   CHOLECYSTECTOMY N/A 11/30/2014   Procedure: LAPAROSCOPIC CHOLECYSTECTOMY SINGLE SITE WITH INTRAOPERATIVE CHOLANGIOGRAM;  Surgeon: Elspeth Schultze, MD;  Location: [facility] ORS;  Service: General;  Laterality: N/A;   SPLENECTOMY      reports that she has been smoking cigarettes. She has never used smokeless tobacco. She reports current drug use. Drug: Marijuana. She reports that she does not drink alcohol.  Allergies  Allergen Reactions   Ketamine  Hives, Nausea And Vomiting and Other (See Comments)    Makes the patient feel funny also   Family History  Problem Relation Age of Onset   Hypertension Mother    Sickle cell anemia Sister    Kidney disease Sister        Lupus   Arthritis Sister    Sickle cell anemia Sister    Sickle cell trait Sister    Heart disease Maternal Aunt        CABG   Heart disease Maternal Uncle        CABG   Lupus  Sister    Prior to Admission medications   Medication Sig Start Date End Date Taking? Authorizing Provider  cyclobenzaprine  (FLEXERIL ) 10 MG tablet TAKE 1 TABLET BY MOUTH DAILY AT BEDTIME AS NEEDED FOR MUSCLE SPASMS Patient taking differently: Take 10 mg by mouth daily as needed for muscle spasms. 07/30/24   Paseda, Folashade R, FNP  FLUoxetine  (PROZAC ) 40 MG capsule Take 1 capsule (40 mg total) by mouth daily. 08/28/24   Paseda, Folashade R, FNP  folic acid  (FOLVITE ) 1 MG tablet Take 1 tablet (1 mg total) by mouth daily. 12/27/23   Paseda, Folashade R, FNP  gabapentin  (NEURONTIN ) 100 MG capsule Take 1 capsule (100 mg total) by mouth 3 (three) times daily. 09/12/24   Harl Zane BRAVO, NP  lidocaine  (LIDODERM ) 5 % Place 1 patch onto the skin daily. Remove & Discard patch within 12 hours or as directed by MD 09/06/24   Kingsley, Victoria K, DO  morphine  (MS CONTIN ) 60 MG 12 hr tablet Take 1 tablet (60 mg total) by mouth every 12 (twelve) hours. 09/08/24   Paseda, Folashade R, FNP  naloxone  (NARCAN ) nasal spray 4 mg/0.1 mL 1 nasal spray in one nostril as needed for opoid overdose 05/28/24   Paseda, Folashade R, FNP  nicotine  (NICODERM CQ  - DOSED IN MG/24 HOURS) 14 mg/24hr patch Place 1 patch (14 mg total) onto the skin daily. Patient not taking: Reported on 09/01/2024 08/28/24   Paseda, Folashade R, FNP  Oxycodone  HCl 10 MG TABS Take 1 tablet (10 mg total) by mouth every 6 (six) hours as needed (pain). 09/20/24   Paseda, Folashade R, FNP  QUEtiapine  (SEROQUEL ) 25 MG tablet Take 1 tablet (25 mg total) by mouth at bedtime. 09/12/24   Harl Zane E, NP  Vitamin D , Ergocalciferol , (DRISDOL ) 1.25 MG (50000 UNIT) CAPS capsule TAKE 1 CAPSULE BY MOUTH ONE TIME PER WEEK Patient taking differently: Take 50,000 Units by mouth every 7 (seven) days. Every Wednesday 12/27/23   Paseda, Folashade R, FNP   I have personally, briefly reviewed patient's prior medical records in [facility]  Objective: Blood  pressure 116/72, pulse 63, temperature 98.5 F (36.9 C), temperature source Oral, resp. rate 20, height 5' 3 (1.6 m), weight 79.4 kg, last menstrual period 09/24/2024, SpO2 100%.   Constitutional: NAD, calm, comfortable HEENT: lids and conjunctivae normal. MMM. Posterior pharynx clear of any exudate or lesions. Normal dentition.  Neck: normal, supple, no masses, no thyromegaly Respiratory: CTAB, no wheezing, no crackles. Normal respiratory effort. No accessory muscle use.  Cardiovascular: RRR, no murmurs / rubs / gallops. No extremity edema. 2+ pedal pulses. no clubbing / cyanosis.  Abdomen: soft, NT, ND, no masses or HSM palpated. Musculoskeletal: No joint deformity upper and lower extremities. Normal muscle tone.  Skin: dry, intact, normal color, normal temperature on exposed skin Neurologic: Alert and oriented x 3. Normal speech. Grossly  non-focal exam. PERRL Psychiatric: Normal mood. Congruent affect.  Labs on Admission: I have personally reviewed admission labs and imaging studies  CBC    Component Value Date/Time   WBC 9.7 10/01/2024 1147   RBC 3.31 (L) 10/01/2024 1230   RBC 3.34 (L) 10/01/2024 1147   HGB 10.0 (L) 10/01/2024 1147   HGB 9.7 (L) 08/28/2024 1127   HCT 28.4 (L) 10/01/2024 1147   HCT 30.2 (L) 08/28/2024 1127   PLT 523 (H) 10/01/2024 1147   PLT 526 (H) 08/28/2024 1127   MCV 85.0 10/01/2024 1147   MCV 92 08/28/2024 1127   MCH 29.9 10/01/2024 1147   MCHC 35.2 10/01/2024 1147   RDW 16.6 (H) 10/01/2024 1147   RDW 14.7 08/28/2024 1127   LYMPHSABS 4.1 (H) 10/01/2024 1147   LYMPHSABS 3.0 08/28/2024 1127   MONOABS 0.9 10/01/2024 1147   EOSABS 0.2 10/01/2024 1147   EOSABS 0.1 08/28/2024 1127   BASOSABS 0.0 10/01/2024 1147   BASOSABS 0.0 08/28/2024 1127   CMP     Component Value Date/Time   NA 139 10/01/2024 1147   NA 138 08/28/2024 1127   K 3.9 10/01/2024 1147   CL 105 10/01/2024 1147   CO2 26 10/01/2024 1147   GLUCOSE 83 10/01/2024 1147   BUN 7 10/01/2024  1147   BUN 10 08/28/2024 1127   CREATININE 0.57 10/01/2024 1147   CALCIUM  8.8 (L) 10/01/2024 1147   PROT 7.1 10/01/2024 1147   PROT 7.4 08/28/2024 1127   ALBUMIN 3.5 10/01/2024 1147   ALBUMIN 4.2 08/28/2024 1127   AST 25 10/01/2024 1147   ALT 19 10/01/2024 1147   ALKPHOS 65 10/01/2024 1147   BILITOT 1.3 (H) 10/01/2024 1147   BILITOT 1.0 08/28/2024 1127   GFRNONAA >60 10/01/2024 1147   GFRAA >60 07/11/2020 0801    Radiological Exams on Admission: UE VENOUS DUPLEX (7am - 7pm) Result Date: 10/01/2024 UPPER VENOUS STUDY  Patient Name:  Carmen [name]  Date of Exam:   10/01/2024 Medical Rec #: [id number]       Accession #:    [id number] Date of Birth: 08-14-1991       Patient Gender: F Patient Age:   73 years Exam Location:  [facility] Procedure:      VAS US  UPPER EXTREMITY VENOUS DUPLEX Referring Phys: ROCKY MASSY --------------------------------------------------------------------------------  Indications: Pain Risk Factors: Sickle Cell Disease. Comparison Study: No previous exams Performing Technologist: Jody Hill RVT, RDMS  Examination Guidelines: A complete evaluation includes B-mode imaging, spectral Doppler, color Doppler, and power Doppler as needed of all accessible portions of each vessel. Bilateral testing is considered an integral part of a complete examination. Limited examinations for reoccurring indications may be performed as noted.  Right Findings: +----------+------------+---------+-----------+----------+-------------------+ RIGHT     CompressiblePhasicitySpontaneousProperties      Summary       +----------+------------+---------+-----------+----------+-------------------+ IJV           Full       Yes       Yes                                  +----------+------------+---------+-----------+----------+-------------------+ Subclavian    Full       Yes       Yes                                   +----------+------------+---------+-----------+----------+-------------------+  Axillary      Full       Yes       Yes                                  +----------+------------+---------+-----------+----------+-------------------+ Brachial      Full       Yes       Yes                                  +----------+------------+---------+-----------+----------+-------------------+ Radial        Full                                                      +----------+------------+---------+-----------+----------+-------------------+ Ulnar         Full                                                      +----------+------------+---------+-----------+----------+-------------------+ Cephalic      Full                                  not well visualized +----------+------------+---------+-----------+----------+-------------------+ Basilic       Full       Yes       Yes                                  +----------+------------+---------+-----------+----------+-------------------+  Left Findings: +----------+------------+---------+-----------+----------+--------------------+ LEFT      CompressiblePhasicitySpontaneousProperties      Summary        +----------+------------+---------+-----------+----------+--------------------+ Subclavian               Yes       Yes                   patent by                                                              color/doppler     +----------+------------+---------+-----------+----------+--------------------+  Summary:  Right: No evidence of deep vein thrombosis in the upper extremity. No evidence of superficial vein thrombosis in the upper extremity.  Left: No evidence of thrombosis in the subclavian.  *See table(s) above for measurements and observations.  Diagnosing physician: Fonda Rim Electronically signed by Fonda Rim on 10/01/2024 at 7:01:39 PM.    Final    EKG: Independently reviewed. NSR HR 81  DVT  prophylaxis: lovenox   Code Status: full  Family Communication: none at bedside   Disposition Plan: admit to [facility] sickle cell team   Consults called: none    Marien LITTIE Piety, DO [facility]  10/01/2024, 7:07 PM    To contact the appropriate [facility] Attending or Consulting provider: Check [email or web address] for coverage  from 7pm-7am

## 2024-10-01 NOTE — ED Notes (Signed)
 CCMD called.

## 2024-10-02 LAB — BASIC METABOLIC PANEL WITH GFR
Anion gap: 10 (ref 5–15)
BUN: 9 mg/dL (ref 6–20)
CO2: 23 mmol/L (ref 22–32)
Calcium: 8.6 mg/dL — ABNORMAL LOW (ref 8.9–10.3)
Chloride: 106 mmol/L (ref 98–111)
Creatinine, Ser: 0.56 mg/dL (ref 0.44–1.00)
GFR, Estimated: >60 mL/min (ref >60)
Glucose, Bld: 90 mg/dL (ref 70–99)
Potassium: 4.2 mmol/L (ref 3.5–5.1)
Sodium: 139 mmol/L (ref 135–145)

## 2024-10-02 LAB — CBC
HCT: 24.4 % — ABNORMAL LOW (ref 36.0–46.0)
Hemoglobin: 8.7 g/dL — ABNORMAL LOW (ref 12.0–15.0)
MCH: 30 pg (ref 26.0–34.0)
MCHC: 35.7 g/dL (ref 30.0–36.0)
MCV: 84.1 fL (ref 80.0–100.0)
Platelets: 385 K/uL (ref 150–400)
RBC: 2.9 MIL/uL — ABNORMAL LOW (ref 3.87–5.11)
RDW: 16.2 % — ABNORMAL HIGH (ref 11.5–15.5)
WBC: 9.1 K/uL (ref 4.0–10.5)
nRBC: 1.7 % — ABNORMAL HIGH (ref 0.0–0.2)

## 2024-10-02 LAB — TROPONIN T, HIGH SENSITIVITY: Troponin T High Sensitivity: <15 ng/L (ref 0–19)

## 2024-10-02 MED ORDER — HYDROMORPHONE HCL 1 MG/ML IJ SOLN
1.0000 mg | INTRAMUSCULAR | Status: DC | PRN
  Administered 2024-10-02 (×3): 2 mg via INTRAVENOUS
  Filled 2024-10-02 (×3): qty 2

## 2024-10-02 MED ORDER — NALOXONE HCL 0.4 MG/ML IJ SOLN: 0.4000 mg | INTRAMUSCULAR | Status: DC | PRN

## 2024-10-02 MED ORDER — ONDANSETRON HCL 4 MG/2ML IJ SOLN: 4.0000 mg | Freq: Four times a day (QID) | INTRAMUSCULAR | Status: DC | PRN

## 2024-10-02 MED ORDER — HYDROMORPHONE 1 MG/ML IV SOLN
INTRAVENOUS | Status: DC
  Administered 2024-10-02: 30 mg via INTRAVENOUS
  Administered 2024-10-02: 9.5 mg via INTRAVENOUS
  Administered 2024-10-02: 5.5 mg via INTRAVENOUS
  Administered 2024-10-03: 4.5 mg via INTRAVENOUS
  Administered 2024-10-03: 4 mg via INTRAVENOUS
  Administered 2024-10-03: 9 mg via INTRAVENOUS
  Administered 2024-10-03: 30 mg via INTRAVENOUS
  Administered 2024-10-03 (×2): 9 mg via INTRAVENOUS
  Administered 2024-10-03: 0.5 mg via INTRAVENOUS
  Administered 2024-10-04: 7 mg via INTRAVENOUS
  Administered 2024-10-04: 1 mg via INTRAVENOUS
  Administered 2024-10-04: 5 mg via INTRAVENOUS
  Administered 2024-10-04: 8.5 mg via INTRAVENOUS
  Administered 2024-10-04: 8 mg via INTRAVENOUS
  Administered 2024-10-04: 30 mg via INTRAVENOUS
  Administered 2024-10-04: 1.5 mg via INTRAVENOUS
  Administered 2024-10-05: 5 mg via INTRAVENOUS
  Administered 2024-10-05: 8.5 mg via INTRAVENOUS
  Administered 2024-10-05: 2 mg via INTRAVENOUS
  Administered 2024-10-06: 6.5 mg via INTRAVENOUS
  Administered 2024-10-06: 3 mg via INTRAVENOUS
  Administered 2024-10-06: 6 mg via INTRAVENOUS
  Administered 2024-10-06: 9 mg via INTRAVENOUS
  Administered 2024-10-06: 30 mg via INTRAVENOUS
  Filled 2024-10-02 (×5): qty 30

## 2024-10-02 MED ORDER — SODIUM CHLORIDE 0.9% FLUSH: 9.0000 mL | INTRAVENOUS | Status: DC | PRN

## 2024-10-02 MED ORDER — HYDROMORPHONE HCL 1 MG/ML IJ SOLN: 1.0000 mg | INTRAMUSCULAR | Status: DC | PRN

## 2024-10-02 MED ORDER — DIPHENHYDRAMINE HCL 25 MG PO CAPS: 25.0000 mg | ORAL_CAPSULE | ORAL | Status: DC | PRN

## 2024-10-02 NOTE — Plan of Care (Signed)
  Problem: Clinical Measurements: Goal: Cardiovascular complication will be avoided Outcome: Progressing   Problem: Education: Goal: Knowledge of General Education information will improve Description: Including pain rating scale, medication(s)/side effects and non-pharmacologic comfort measures Outcome: Not Progressing   Problem: Health Behavior/Discharge Planning: Goal: Ability to manage health-related needs will improve Outcome: Not Progressing   Problem: Clinical Measurements: Goal: Ability to maintain clinical measurements within normal limits will improve Outcome: Not Progressing Goal: Will remain free from infection Outcome: Not Progressing Goal: Diagnostic test results will improve Outcome: Not Progressing Goal: Respiratory complications will improve Outcome: Not Progressing

## 2024-10-02 NOTE — Plan of Care (Signed)
  Problem: Education: Goal: Knowledge of General Education information will improve Description: Including pain rating scale, medication(s)/side effects and non-pharmacologic comfort measures Outcome: Progressing   Problem: Clinical Measurements: Goal: Respiratory complications will improve Outcome: Progressing Goal: Cardiovascular complication will be avoided Outcome: Progressing   Problem: Activity: Goal: Risk for activity intolerance will decrease Outcome: Progressing   Problem: Nutrition: Goal: Adequate nutrition will be maintained Outcome: Progressing   Problem: Coping: Goal: Level of anxiety will decrease Outcome: Progressing   Problem: Elimination: Goal: Will not experience complications related to bowel motility Outcome: Progressing Goal: Will not experience complications related to urinary retention Outcome: Progressing

## 2024-10-02 NOTE — Progress Notes (Signed)
 Patient ID: Carmen [name], female   DOB: 1991/10/07, 33 y.o.   MRN: [id number] Subjective: Carmen [name] is a 33 y.o. female with medical history significant of sickle cell disease, chronic pain syndrome, sickle cell anemia, who presents to the emergency room complaining of worsening pain in her back and legs consistent with her typical sickle cell crisis.  Patient is endorsing pain of 9/10 this morning. She denies nausea, vomiting or diarrhea, headache, cough. No urinary symptoms.   Objective:  Vital signs in last 24 hours:  Vitals:   10/01/24 2340 10/02/24 0350 10/02/24 0742 10/02/24 1008  BP: (!) 107/92 124/80 (!) 141/78   Pulse: 69 (!) 55 66   Resp: 14 14 16 16   Temp: 98.9 F (37.2 C) 98.8 F (37.1 C) 98.1 F (36.7 C)   TempSrc: Oral Oral Oral   SpO2: 100% 100% 99%   Weight: 76.7 kg     Height:        Intake/Output from previous day:   Intake/Output Summary (Last 24 hours) at 10/02/2024 1224 Last data filed at 10/02/2024 9371 Gross per 24 hour  Intake 549.35 ml  Output --  Net 549.35 ml    Physical Exam: General: Alert, awake, oriented x3, in no acute distress.  HEENT: [place]/AT PEERL, EOMI Neck: Trachea midline,  no masses, no thyromegal,y no JVD, no carotid bruit OROPHARYNX:  Moist, No exudate/ erythema/lesions.  Heart: Regular rate and rhythm, without murmurs, rubs, gallops, PMI non-displaced, no heaves or thrills on palpation.  Lungs: Clear to auscultation, no wheezing or rhonchi noted. No increased vocal fremitus resonant to percussion  Abdomen: Soft, nontender, nondistended, positive bowel sounds, no masses no hepatosplenomegaly noted..  Neuro: No focal neurological deficits noted cranial nerves II through XII grossly intact. DTRs 2+ bilaterally upper and lower extremities. Strength 5 out of 5 in bilateral upper and lower extremities. Musculoskeletal: No warm swelling or erythema around joints, no spinal tenderness noted. Psychiatric: Patient alert and oriented x3,  good insight and cognition, good recent to remote recall. Lymph node survey: No cervical axillary or inguinal lymphadenopathy noted.  Lab Results:  Basic Metabolic Panel:    Component Value Date/Time   NA 139 10/02/2024 0126   NA 138 08/28/2024 1127   K 4.2 10/02/2024 0126   CL 106 10/02/2024 0126   CO2 23 10/02/2024 0126   BUN 9 10/02/2024 0126   BUN 10 08/28/2024 1127   CREATININE 0.56 10/02/2024 0126   GLUCOSE 90 10/02/2024 0126   CALCIUM  8.6 (L) 10/02/2024 0126   CBC:    Component Value Date/Time   WBC 9.1 10/02/2024 0126   HGB 8.7 (L) 10/02/2024 0126   HGB 9.7 (L) 08/28/2024 1127   HCT 24.4 (L) 10/02/2024 0126   HCT 30.2 (L) 08/28/2024 1127   PLT 385 10/02/2024 0126   PLT 526 (H) 08/28/2024 1127   MCV 84.1 10/02/2024 0126   MCV 92 08/28/2024 1127   NEUTROABS 4.4 10/01/2024 1147   NEUTROABS 4.0 08/28/2024 1127   LYMPHSABS 4.1 (H) 10/01/2024 1147   LYMPHSABS 3.0 08/28/2024 1127   MONOABS 0.9 10/01/2024 1147   EOSABS 0.2 10/01/2024 1147   EOSABS 0.1 08/28/2024 1127   BASOSABS 0.0 10/01/2024 1147   BASOSABS 0.0 08/28/2024 1127    No results found for this or any previous visit (from the past 240 hours).  Studies/Results: UE VENOUS DUPLEX (7am - 7pm) Result Date: 10/01/2024 UPPER VENOUS STUDY  Patient Name:  Carmen [name]  Date of Exam:   10/01/2024  Medical Rec #: [id number]       Accession #:    [id number] Date of Birth: 03-29-1991       Patient Gender: F Patient Age:   89 years Exam Location:  [facility] Procedure:      VAS US  UPPER EXTREMITY VENOUS DUPLEX Referring Phys: ROCKY MASSY --------------------------------------------------------------------------------  Indications: Pain Risk Factors: Sickle Cell Disease. Comparison Study: No previous exams Performing Technologist: Jody Hill RVT, RDMS  Examination Guidelines: A complete evaluation includes B-mode imaging, spectral Doppler, color Doppler, and power Doppler as needed of all accessible portions of  each vessel. Bilateral testing is considered an integral part of a complete examination. Limited examinations for reoccurring indications may be performed as noted.  Right Findings: +----------+------------+---------+-----------+----------+-------------------+ RIGHT     CompressiblePhasicitySpontaneousProperties      Summary       +----------+------------+---------+-----------+----------+-------------------+ IJV           Full       Yes       Yes                                  +----------+------------+---------+-----------+----------+-------------------+ Subclavian    Full       Yes       Yes                                  +----------+------------+---------+-----------+----------+-------------------+ Axillary      Full       Yes       Yes                                  +----------+------------+---------+-----------+----------+-------------------+ Brachial      Full       Yes       Yes                                  +----------+------------+---------+-----------+----------+-------------------+ Radial        Full                                                      +----------+------------+---------+-----------+----------+-------------------+ Ulnar         Full                                                      +----------+------------+---------+-----------+----------+-------------------+ Cephalic      Full                                  not well visualized +----------+------------+---------+-----------+----------+-------------------+ Basilic       Full       Yes       Yes                                  +----------+------------+---------+-----------+----------+-------------------+  Left Findings: +----------+------------+---------+-----------+----------+--------------------+  LEFT      CompressiblePhasicitySpontaneousProperties      Summary        +----------+------------+---------+-----------+----------+--------------------+  Subclavian               Yes       Yes                   patent by                                                              color/doppler     +----------+------------+---------+-----------+----------+--------------------+  Summary:  Right: No evidence of deep vein thrombosis in the upper extremity. No evidence of superficial vein thrombosis in the upper extremity.  Left: No evidence of thrombosis in the subclavian.  *See table(s) above for measurements and observations.  Diagnosing physician: Fonda Rim Electronically signed by Fonda Rim on 10/01/2024 at 7:01:39 PM.    Final     Medications: Scheduled Meds:  enoxaparin  (LOVENOX ) injection  40 mg Subcutaneous Q24H   FLUoxetine   40 mg Oral Daily   gabapentin   100 mg Oral TID   HYDROmorphone    Intravenous Q4H   polyethylene glycol  17 g Oral Daily   QUEtiapine   25 mg Oral QHS   Continuous Infusions:  sodium chloride  40 mL/hr at 10/02/24 0628   PRN Meds:.acetaminophen  **OR** acetaminophen , albuterol , bisacodyl , diphenhydrAMINE , HYDROcodone -acetaminophen , naloxone  **AND** sodium chloride  flush, nicotine , ondansetron  (ZOFRAN ) IV  Consultants: None  Procedures: None  Antibiotics: None  Assessment/Plan: Active Problems:   Anemia of chronic disease   Chronic musculoskeletal pain   Sickle cell pain crisis (HCC)   Tobacco use disorder, mild, abuse   GAD (generalized anxiety disorder)   Hb Sickle Cell Disease with Pain crisis: Continue IVF 0.45% Saline @ KVO, start weight based Dilaudid  PCA, IV Toradol  15 mg Q 6 H for a total of 5 days, continue oral home pain medications as ordered. Monitor vitals very closely, Re-evaluate pain scale regularly, 2 L of Oxygen  by [place].  Anemia of Chronic Disease:  Hemoglobin within patient's baseline, no clinical indication for transfusion. Will continue to monitor daily CBC.  Chronic pain Syndrome:  Continue oral home pain medication.  Tobacco use disorder: Encouraged smoking  cessation. GAD ( generalized anxiety disorder): Patient is stable, denies suicidal ideations. Continue medications as prescribed and keep all follow up appointments as scheduled.   Code Status: Full Code Family Communication: N/A Disposition Plan: Not yet ready for discharge  [name] Cover NP   If 7PM-7AM, please contact night-coverage.  10/02/2024, 12:24 PM  LOS: 1 day

## 2024-10-03 MED ORDER — MORPHINE SULFATE ER 30 MG PO TBCR
60.0000 mg | EXTENDED_RELEASE_TABLET | Freq: Two times a day (BID) | ORAL | Status: DC
  Administered 2024-10-03 – 2024-10-07 (×9): 60 mg via ORAL
  Filled 2024-10-03 (×9): qty 2

## 2024-10-03 MED ORDER — KETOROLAC TROMETHAMINE 15 MG/ML IJ SOLN
15.0000 mg | Freq: Once | INTRAMUSCULAR | Status: AC
  Administered 2024-10-03: 15 mg via INTRAVENOUS
  Filled 2024-10-03: qty 1

## 2024-10-03 MED ORDER — FOLIC ACID 1 MG PO TABS
1.0000 mg | ORAL_TABLET | Freq: Every day | ORAL | Status: DC
  Administered 2024-10-03 – 2024-10-07 (×5): 1 mg via ORAL
  Filled 2024-10-03 (×5): qty 1

## 2024-10-03 MED ORDER — CYCLOBENZAPRINE HCL 10 MG PO TABS
10.0000 mg | ORAL_TABLET | Freq: Every day | ORAL | Status: DC
  Administered 2024-10-03 – 2024-10-06 (×4): 10 mg via ORAL
  Filled 2024-10-03 (×4): qty 1

## 2024-10-03 MED ORDER — KETOROLAC TROMETHAMINE 15 MG/ML IJ SOLN
15.0000 mg | Freq: Four times a day (QID) | INTRAMUSCULAR | Status: AC
  Administered 2024-10-03 – 2024-10-04 (×4): 15 mg via INTRAVENOUS
  Filled 2024-10-03 (×4): qty 1

## 2024-10-03 MED ORDER — OXYCODONE HCL 5 MG PO TABS
10.0000 mg | ORAL_TABLET | Freq: Four times a day (QID) | ORAL | Status: DC | PRN
  Administered 2024-10-04 – 2024-10-07 (×6): 10 mg via ORAL
  Filled 2024-10-03 (×7): qty 2

## 2024-10-03 NOTE — Progress Notes (Cosign Needed Addendum)
 Patient ID: Carmen [name], female   DOB: 1991-08-12, 33 y.o.   MRN: [id number] Subjective: Carmen [name] is a 33 y.o. female with medical history significant of sickle cell disease, chronic pain syndrome, sickle cell anemia, who presents to the emergency room complaining of worsening pain in her back and legs consistent with her typical sickle cell crisis.  Patient is endorsing pain of 7/10 this morning and gradually improving. She denies nausea, vomiting or diarrhea, headache, cough. No urinary symptoms.   Objective:  Vital signs in last 24 hours:  Vitals:   10/03/24 0554 10/03/24 0601 10/03/24 0603 10/03/24 0604  BP:  129/82    Pulse:  72    Resp: 14 14 14 14   Temp:  98.2 F (36.8 C)    TempSrc:  Oral    SpO2: 100% 98%    Weight:      Height:        Intake/Output from previous day:   Intake/Output Summary (Last 24 hours) at 10/03/2024 1112 Last data filed at 10/03/2024 0414 Gross per 24 hour  Intake 694.51 ml  Output --  Net 694.51 ml    Physical Exam: General: Alert, awake, oriented x3, in no acute distress.  HEENT: Carmen [name]/AT PEERL, EOMI Neck: Trachea midline,  no masses, no thyromegal,y no JVD, no carotid bruit OROPHARYNX:  Moist, No exudate/ erythema/lesions.  Heart: Regular rate and rhythm, without murmurs, rubs, gallops, PMI non-displaced, no heaves or thrills on palpation.  Lungs: Clear to auscultation, no wheezing or rhonchi noted. No increased vocal fremitus resonant to percussion  Abdomen: Soft, nontender, nondistended, positive bowel sounds, no masses no hepatosplenomegaly noted..  Neuro: No focal neurological deficits noted cranial nerves II through XII grossly intact. DTRs 2+ bilaterally upper and lower extremities. Strength 5 out of 5 in bilateral upper and lower extremities. Musculoskeletal:Generalize body tenderness Psychiatric: Patient alert and oriented x3, good insight and cognition, good recent to remote recall. Lymph node survey: No cervical axillary or  inguinal lymphadenopathy noted.  Lab Results:  Basic Metabolic Panel:    Component Value Date/Time   NA 139 10/02/2024 0126   NA 138 08/28/2024 1127   K 4.2 10/02/2024 0126   CL 106 10/02/2024 0126   CO2 23 10/02/2024 0126   BUN 9 10/02/2024 0126   BUN 10 08/28/2024 1127   CREATININE 0.56 10/02/2024 0126   GLUCOSE 90 10/02/2024 0126   CALCIUM  8.6 (L) 10/02/2024 0126   CBC:    Component Value Date/Time   WBC 9.1 10/02/2024 0126   HGB 8.7 (L) 10/02/2024 0126   HGB 9.7 (L) 08/28/2024 1127   HCT 24.4 (L) 10/02/2024 0126   HCT 30.2 (L) 08/28/2024 1127   PLT 385 10/02/2024 0126   PLT 526 (H) 08/28/2024 1127   MCV 84.1 10/02/2024 0126   MCV 92 08/28/2024 1127   NEUTROABS 4.4 10/01/2024 1147   NEUTROABS 4.0 08/28/2024 1127   LYMPHSABS 4.1 (H) 10/01/2024 1147   LYMPHSABS 3.0 08/28/2024 1127   MONOABS 0.9 10/01/2024 1147   EOSABS 0.2 10/01/2024 1147   EOSABS 0.1 08/28/2024 1127   BASOSABS 0.0 10/01/2024 1147   BASOSABS 0.0 08/28/2024 1127    No results found for this or any previous visit (from the past 240 hours).  Studies/Results: UE VENOUS DUPLEX (7am - 7pm) Result Date: 10/01/2024 UPPER VENOUS STUDY  Patient Name:  Carmen [name]  Date of Exam:   10/01/2024 Medical Rec #: [id number]       Accession #:    [id number]  Date of Birth: 1991/02/18       Patient Gender: F Patient Age:   55 years Exam Location:  [facility] Procedure:      VAS US  UPPER EXTREMITY VENOUS DUPLEX Referring Phys: ROCKY MASSY --------------------------------------------------------------------------------  Indications: Pain Risk Factors: Sickle Cell Disease. Comparison Study: No previous exams Performing Technologist: Jody Hill RVT, RDMS  Examination Guidelines: A complete evaluation includes B-mode imaging, spectral Doppler, color Doppler, and power Doppler as needed of all accessible portions of each vessel. Bilateral testing is considered an integral part of a complete examination. Limited  examinations for reoccurring indications may be performed as noted.  Right Findings: +----------+------------+---------+-----------+----------+-------------------+ RIGHT     CompressiblePhasicitySpontaneousProperties      Summary       +----------+------------+---------+-----------+----------+-------------------+ IJV           Full       Yes       Yes                                  +----------+------------+---------+-----------+----------+-------------------+ Subclavian    Full       Yes       Yes                                  +----------+------------+---------+-----------+----------+-------------------+ Axillary      Full       Yes       Yes                                  +----------+------------+---------+-----------+----------+-------------------+ Brachial      Full       Yes       Yes                                  +----------+------------+---------+-----------+----------+-------------------+ Radial        Full                                                      +----------+------------+---------+-----------+----------+-------------------+ Ulnar         Full                                                      +----------+------------+---------+-----------+----------+-------------------+ Cephalic      Full                                  not well visualized +----------+------------+---------+-----------+----------+-------------------+ Basilic       Full       Yes       Yes                                  +----------+------------+---------+-----------+----------+-------------------+  Left Findings: +----------+------------+---------+-----------+----------+--------------------+ LEFT      CompressiblePhasicitySpontaneousProperties      Summary        +----------+------------+---------+-----------+----------+--------------------+  Subclavian               Yes       Yes                   patent by                                                               color/doppler     +----------+------------+---------+-----------+----------+--------------------+  Summary:  Right: No evidence of deep vein thrombosis in the upper extremity. No evidence of superficial vein thrombosis in the upper extremity.  Left: No evidence of thrombosis in the subclavian.  *See table(s) above for measurements and observations.  Diagnosing physician: Fonda Rim Electronically signed by Fonda Rim on 10/01/2024 at 7:01:39 PM.    Final     Medications: Scheduled Meds:  cyclobenzaprine   10 mg Oral QHS   enoxaparin  (LOVENOX ) injection  40 mg Subcutaneous Q24H   FLUoxetine   40 mg Oral Daily   folic acid   1 mg Oral Daily   gabapentin   100 mg Oral TID   HYDROmorphone    Intravenous Q4H   ketorolac   15 mg Intravenous Q6H   morphine   60 mg Oral Q12H   polyethylene glycol  17 g Oral Daily   QUEtiapine   25 mg Oral QHS   Continuous Infusions:   PRN Meds:.acetaminophen  **OR** acetaminophen , albuterol , bisacodyl , diphenhydrAMINE , HYDROcodone -acetaminophen , naloxone  **AND** sodium chloride  flush, nicotine , ondansetron  (ZOFRAN ) IV, oxyCODONE   Consultants: None  Procedures: None  Antibiotics: None  Assessment/Plan: Active Problems:   Anemia of chronic disease   Chronic musculoskeletal pain   Sickle cell pain crisis (HCC)   Tobacco use disorder, mild, abuse   GAD (generalized anxiety disorder)   Hb Sickle Cell Disease with Pain crisis: Continue IVF 0.45% Saline @ KVO, start weight based Dilaudid  PCA, IV Toradol  15 mg Q 6 H for a total of 5 days, continue oral home pain medications as ordered. Monitor vitals very closely, Re-evaluate pain scale regularly, 2 L of Oxygen  by [place].  Anemia of Chronic Disease:  Hemoglobin within patient's baseline, no clinical indication for transfusion. Will continue to monitor daily CBC.  Chronic pain Syndrome:  Continue oral home pain medication.  Tobacco use disorder: Encouraged smoking cessation. GAD (  generalized anxiety disorder): Patient is stable, denies suicidal ideations. Continue medications as prescribed and keep all follow up appointments as scheduled.   Code Status: Full Code Family Communication: N/A Disposition Plan: Not yet ready for discharge  [name] Cover NP   If 7PM-7AM, please contact night-coverage.  10/03/2024, 11:12 AM  LOS: 2 days

## 2024-10-04 LAB — CBC
HCT: 26.8 % — ABNORMAL LOW (ref 36.0–46.0)
Hemoglobin: 9.8 g/dL — ABNORMAL LOW (ref 12.0–15.0)
MCH: 30.1 pg (ref 26.0–34.0)
MCHC: 36.6 g/dL — ABNORMAL HIGH (ref 30.0–36.0)
MCV: 82.2 fL (ref 80.0–100.0)
Platelets: 315 K/uL (ref 150–400)
RBC: 3.26 MIL/uL — ABNORMAL LOW (ref 3.87–5.11)
RDW: 15.9 % — ABNORMAL HIGH (ref 11.5–15.5)
WBC: 12.6 K/uL — ABNORMAL HIGH (ref 4.0–10.5)
nRBC: 0.7 % — ABNORMAL HIGH (ref 0.0–0.2)

## 2024-10-04 NOTE — TOC Initial Note (Signed)
 Transition of Care [facility]) - Initial/Assessment Note    Patient Details  Name: Carmen [name] MRN: [id number] Date of Birth: 1991/07/10  Transition of [facility]) CM/SW Contact:    Toy LITTIE Agar, RN Phone Number:[phone]  10/04/2024, 4:09 PM  Clinical Narrative:                 Inpatient care manager following patient with high readmission risk. Patient is from home where she normally functions independently. Currently there are no inpatient care manager needs.   Expected Discharge Plan: Home/Self Care Barriers to Discharge: Continued Medical Work up   Patient Goals and CMS Choice Patient states their goals for this hospitalization and ongoing recovery are:: Ready to go home   Choice offered to / list presented to : NA [place] ownership interest in [facility].provided to::  (n/a)    Expected Discharge Plan and Services In-house Referral: NA Discharge Planning Services: CM Consult Post Acute Care Choice: NA Living arrangements for the past 2 months: Apartment                 DME Arranged: N/A DME Agency: NA       HH Arranged: NA HH Agency: NA        Prior Living Arrangements/Services Living arrangements for the past 2 months: Apartment Lives with:: Self Patient language and need for interpreter reviewed:: Yes Do you feel safe going back to the place where you live?: Yes      Need for Family Participation in Patient Care: No (Comment) Care giver support system in place?: Yes (comment) Current home services:  (n/a) Criminal Activity/Legal Involvement Pertinent to Current Situation/Hospitalization: No - Comment as needed  Activities of Daily Living   ADL Screening (condition at time of admission) Independently performs ADLs?: Yes (appropriate for developmental age) Is the patient deaf or have difficulty hearing?: No Does the patient have difficulty seeing, even when wearing glasses/contacts?: No Does the patient have difficulty concentrating,  remembering, or making decisions?: No  Permission Sought/Granted Permission sought to share information with : Family Supports Permission granted to share information with : No              Emotional Assessment Appearance:: Appears stated age Attitude/Demeanor/Rapport: Gracious Affect (typically observed): Pleasant, Quiet Orientation: : Oriented to Self, Oriented to Place, Oriented to  Time, Oriented to Situation Alcohol / Substance Use: Not Applicable Psych Involvement: No (comment)  Admission diagnosis:  Sickle cell pain crisis (HCC) [D57.00] Patient Active Problem List   Diagnosis Date Noted   Need for influenza vaccination 08/28/2024   GAD (generalized anxiety disorder) 08/28/2024   Obesity (BMI 30-39.9) 05/28/2024   Annual physical exam 05/28/2024   Encounter for routine dental examination 05/28/2024   Vasoocclusive sickle cell crisis (HCC) 01/29/2024   Marijuana smoker 12/27/2023   Encounter for contraceptive management 12/27/2023   Hospital discharge follow-up 03/09/2023   Nausea & vomiting 03/03/2022   Acute chest syndrome due to sickle cell crisis (HCC) 09/05/2021   Chronic pain syndrome 08/21/2021   Severe major depression without psychotic features (HCC) 05/28/2021   Major depressive disorder 05/22/2021   Tobacco use disorder, mild, abuse 05/21/2021   Sickle cell disease with crisis (HCC) 03/23/2021   Acute pain of right shoulder    Sickle cell anemia with crisis (HCC) 02/16/2021   Community acquired pneumonia of right lower lobe of lung    Fever of unknown origin 10/14/2020   Sickle cell crisis (HCC) 03/31/2020   Bacterial vaginitis 01/04/2020  Acute cystitis without hematuria    Dysuria 12/31/2019   Urine leukocytes    Sickle cell pain crisis (HCC) 09/10/2019   Sickle cell disease (HCC) 12/04/2018   Alpha thalassemia silent carrier 12/04/2018   Chronic prescription opiate use 03/13/2018   Chronic musculoskeletal pain 03/13/2018   Vitamin D  deficiency  01/13/2018   Leukocytosis 08/21/2017   Hypokalemia 06/27/2017   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[facility]) 04/05/2017   Constipation due to pain medication    Hb-SS disease without crisis (HCC) 08/15/2016   Anemia of chronic disease    Chest pain 11/10/2015   Sickle cell anemia with pain (HCC) 02/16/2015   Thrombocytosis 11/22/2014   Systolic ejection murmur 09/11/2014   Sickle cell anemia of mother during pregnancy (HCC) 05/16/2014   PCP:  Paseda, Folashade R, FNP Pharmacy:   [facility] [phone] [address], [place] - [address] AT [facility] [address] [place] [address] Phone: [phone] Fax: [phone]  [facility] [place], [place] - [address] [place] [address] Phone: [phone] Fax: [phone]  [id number] [address] [place] - [address] [place] [address] Phone: [phone] Fax: [phone]  [id number] [place], [place] - [address] AT CORNER OF [facility] [place] [address] Phone: [phone] Fax: [phone]     Social Drivers of Health (SDOH) Social History: SDOH Screenings   Food Insecurity: No Food Insecurity (10/02/2024)  Recent Concern: Food Insecurity - Food Insecurity Present (09/01/2024)  Housing: High Risk (10/02/2024)  Transportation Needs: No Transportation Needs (10/02/2024)  Recent Concern: Transportation Needs - Unmet Transportation Needs (09/01/2024)  Utilities: Not At Risk (10/02/2024)  Alcohol Screen: Low Risk  (08/28/2024)  Depression (PHQ2-9): High Risk (09/12/2024)  Financial Resource Strain: Low Risk  (08/28/2024)  Physical Activity: Sufficiently Active (08/28/2024)  Social Connections: Socially Isolated (08/28/2024)  Stress: Stress Concern Present (08/28/2024)  Tobacco Use: High Risk (10/01/2024)   SDOH Interventions:      Readmission Risk Interventions    10/04/2024    3:59 PM 08/02/2024    3:35 PM 01/30/2024   11:09 AM  Readmission Risk Prevention Plan  Transportation Screening Complete Complete Complete  PCP or Specialist Appt within 3-5 Days  Complete   HRI or Home Care Consult  Complete   Social Work Consult for Recovery Care Planning/Counseling  Complete   Palliative Care Screening  Not Applicable   Medication Review [employer]) Complete Complete Complete  HRI or Home Care Consult Complete  Complete  SW Recovery Care/Counseling Consult Complete  Complete  Palliative Care Screening Not Applicable  Not Applicable  Skilled Nursing Facility Not Applicable  Not Applicable

## 2024-10-04 NOTE — Progress Notes (Signed)
 Patient ID: Carmen [name], female   DOB: 1991-02-05, 33 y.o.   MRN: [id number] Subjective: Carmen [name] is a 33 y.o. female with medical history significant of sickle cell disease, chronic pain syndrome, sickle cell anemia, who presents to the emergency room complaining of worsening pain in her back and legs consistent with her typical sickle cell crisis.  Patient is endorsing pain of 6/10 this morning. She denies nausea, vomiting or diarrhea, headache, cough. No urinary symptoms.   Objective:  Vital signs in last 24 hours:  Vitals:   10/04/24 0418 10/04/24 0614 10/04/24 0720 10/04/24 0833  BP:  (!) 137/94    Pulse:  77    Resp: 15 18 18 18   Temp:  97.9 F (36.6 C)    TempSrc:      SpO2: 98% 100%    Weight:      Height:        Intake/Output from previous day:  No intake or output data in the 24 hours ending 10/04/24 1023   Physical Exam: General: Alert, awake, oriented x3, in no acute distress.  HEENT: [place]/AT PEERL, EOMI Neck: Trachea midline,  no masses, no thyromegal,y no JVD, no carotid bruit OROPHARYNX:  Moist, No exudate/ erythema/lesions.  Heart: Regular rate and rhythm, without murmurs, rubs, gallops, PMI non-displaced, no heaves or thrills on palpation.  Lungs: Clear to auscultation, no wheezing or rhonchi noted. No increased vocal fremitus resonant to percussion  Abdomen: Soft, nontender, nondistended, positive bowel sounds, no masses no hepatosplenomegaly noted..  Neuro: No focal neurological deficits noted cranial nerves II through XII grossly intact. DTRs 2+ bilaterally upper and lower extremities. Strength 5 out of 5 in bilateral upper and lower extremities. Musculoskeletal: No warm swelling or erythema around joints, no spinal tenderness noted. Psychiatric: Patient alert and oriented x3, good insight and cognition, good recent to remote recall. Lymph node survey: No cervical axillary or inguinal lymphadenopathy noted.  Lab Results:  Basic Metabolic Panel:     Component Value Date/Time   NA 139 10/02/2024 0126   NA 138 08/28/2024 1127   K 4.2 10/02/2024 0126   CL 106 10/02/2024 0126   CO2 23 10/02/2024 0126   BUN 9 10/02/2024 0126   BUN 10 08/28/2024 1127   CREATININE 0.56 10/02/2024 0126   GLUCOSE 90 10/02/2024 0126   CALCIUM  8.6 (L) 10/02/2024 0126   CBC:    Component Value Date/Time   WBC 9.1 10/02/2024 0126   HGB 8.7 (L) 10/02/2024 0126   HGB 9.7 (L) 08/28/2024 1127   HCT 24.4 (L) 10/02/2024 0126   HCT 30.2 (L) 08/28/2024 1127   PLT 385 10/02/2024 0126   PLT 526 (H) 08/28/2024 1127   MCV 84.1 10/02/2024 0126   MCV 92 08/28/2024 1127   NEUTROABS 4.4 10/01/2024 1147   NEUTROABS 4.0 08/28/2024 1127   LYMPHSABS 4.1 (H) 10/01/2024 1147   LYMPHSABS 3.0 08/28/2024 1127   MONOABS 0.9 10/01/2024 1147   EOSABS 0.2 10/01/2024 1147   EOSABS 0.1 08/28/2024 1127   BASOSABS 0.0 10/01/2024 1147   BASOSABS 0.0 08/28/2024 1127    No results found for this or any previous visit (from the past 240 hours).  Studies/Results: No results found.   Medications: Scheduled Meds:  cyclobenzaprine   10 mg Oral QHS   enoxaparin  (LOVENOX ) injection  40 mg Subcutaneous Q24H   FLUoxetine   40 mg Oral Daily   folic acid   1 mg Oral Daily   gabapentin   100 mg Oral TID   HYDROmorphone   Intravenous Q4H   morphine   60 mg Oral Q12H   polyethylene glycol  17 g Oral Daily   QUEtiapine   25 mg Oral QHS   Continuous Infusions:   PRN Meds:.acetaminophen  **OR** acetaminophen , albuterol , bisacodyl , diphenhydrAMINE , HYDROcodone -acetaminophen , naloxone  **AND** sodium chloride  flush, nicotine , ondansetron  (ZOFRAN ) IV, oxyCODONE   Consultants: None  Procedures: None  Antibiotics: None  Assessment/Plan: Active Problems:   Anemia of chronic disease   Chronic musculoskeletal pain   Sickle cell pain crisis (HCC)   Tobacco use disorder, mild, abuse   GAD (generalized anxiety disorder)   Hb Sickle Cell Disease with Pain crisis: Pain is gradually  improving, Continue IVF 0.45% Saline @ KVO, start weight based Dilaudid  PCA, IV Toradol  15 mg Q 6 H for a total of 5 days, continue oral home pain medications as ordered. Monitor vitals very closely, Re-evaluate pain scale regularly, 2 L of Oxygen  by [place].  Anemia of Chronic Disease: Hemoglobin within patient's baseline, no clinical indication for transfusion. Will continue to monitor daily CBC.  Chronic pain Syndrome:  Continue oral home pain medication.  Tobacco use disorder: Encouraged smoking cessation. GAD ( generalized anxiety disorder): Patient is stable, denies suicidal ideations. Continue medications as prescribed and keep all follow up appointments as scheduled.   Code Status: Full Code Family Communication: N/A Disposition Plan: Not yet ready for discharge  [name] Cover NP   If 7PM-7AM, please contact night-coverage.  10/04/2024, 10:23 AM  LOS: 3 days

## 2024-10-04 NOTE — Plan of Care (Signed)
  Problem: Activity: Goal: Risk for activity intolerance will decrease Outcome: Progressing   Problem: Pain Managment: Goal: General experience of comfort will improve and/or be controlled Outcome: Progressing   Problem: Safety: Goal: Ability to remain free from injury will improve Outcome: Progressing   Problem: Skin Integrity: Goal: Risk for impaired skin integrity will decrease Outcome: Progressing

## 2024-10-04 NOTE — Plan of Care (Signed)
°  Problem: Education: Goal: Knowledge of General Education information will improve Description: Including pain rating scale, medication(s)/side effects and non-pharmacologic comfort measures Outcome: Progressing   Problem: Health Behavior/Discharge Planning: Goal: Ability to manage health-related needs will improve Outcome: Progressing   Problem: Clinical Measurements: Goal: Ability to maintain clinical measurements within normal limits will improve Outcome: Progressing   Problem: Activity: Goal: Risk for activity intolerance will decrease Outcome: Progressing   Problem: Nutrition: Goal: Adequate nutrition will be maintained Outcome: Progressing   Problem: Coping: Goal: Level of anxiety will decrease Outcome: Progressing   Problem: Elimination: Goal: Will not experience complications related to urinary retention Outcome: Progressing   Problem: Safety: Goal: Ability to remain free from injury will improve Outcome: Progressing

## 2024-10-05 LAB — CBC
HCT: 26.6 % — ABNORMAL LOW (ref 36.0–46.0)
Hemoglobin: 9.6 g/dL — ABNORMAL LOW (ref 12.0–15.0)
MCH: 30.1 pg (ref 26.0–34.0)
MCHC: 36.1 g/dL — ABNORMAL HIGH (ref 30.0–36.0)
MCV: 83.4 fL (ref 80.0–100.0)
Platelets: 498 K/uL — ABNORMAL HIGH (ref 150–400)
RBC: 3.19 MIL/uL — ABNORMAL LOW (ref 3.87–5.11)
RDW: 15.5 % (ref 11.5–15.5)
WBC: 11 K/uL — ABNORMAL HIGH (ref 4.0–10.5)
nRBC: 0.5 % — ABNORMAL HIGH (ref 0.0–0.2)

## 2024-10-05 NOTE — Plan of Care (Signed)
°  Problem: Education: °Goal: Knowledge of General Education information will improve °Description: Including pain rating scale, medication(s)/side effects and non-pharmacologic comfort measures °Outcome: Progressing °  °Problem: Health Behavior/Discharge Planning: °Goal: Ability to manage health-related needs will improve °Outcome: Progressing °  °Problem: Activity: °Goal: Risk for activity intolerance will decrease °Outcome: Progressing °  °Problem: Nutrition: °Goal: Adequate nutrition will be maintained °Outcome: Progressing °  °Problem: Coping: °Goal: Level of anxiety will decrease °Outcome: Progressing °  °Problem: Safety: °Goal: Ability to remain free from injury will improve °Outcome: Progressing °  °

## 2024-10-05 NOTE — Plan of Care (Signed)

## 2024-10-05 NOTE — Progress Notes (Signed)
 Patient ID: Carmen [name], female   DOB: [birth year], 33 y.o.   MRN: [id number] Subjective: Carmen [name] is a 33 y.o. female with medical history significant of sickle cell disease, chronic pain syndrome, sickle cell anemia, who presents to the emergency room complaining of worsening pain in her back and legs consistent with her typical sickle cell crisis.  Patient is endorsing pain of 6/10 this morning and gradually improving but not at baseline.  She denies nausea, vomiting or diarrhea, headache, cough. No urinary symptoms.   Objective:  Vital signs in last 24 hours:  Vitals:   10/05/24 0500 10/05/24 0509 10/05/24 0833 10/05/24 1044  BP:  (!) 137/94  134/88  Pulse:  94  91  Resp: 16 14 14    Temp:  99 F (37.2 C)  98 F (36.7 C)  TempSrc:  Oral  Oral  SpO2:  100%  99%  Weight:      Height:        Intake/Output from previous day:  No intake or output data in the 24 hours ending 10/05/24 1051   Physical Exam: General: Alert, awake, oriented x3, in no acute distress.  HEENT: /AT PEERL, EOMI Neck: Trachea midline,  no masses, no thyromegal,y no JVD, no carotid bruit OROPHARYNX:  Moist, No exudate/ erythema/lesions.  Heart: Regular rate and rhythm, without murmurs, rubs, gallops, PMI non-displaced, no heaves or thrills on palpation.  Lungs: Clear to auscultation, no wheezing or rhonchi noted. No increased vocal fremitus resonant to percussion  Abdomen: Soft, nontender, nondistended, positive bowel sounds, no masses no hepatosplenomegaly noted..  Neuro: No focal neurological deficits noted cranial nerves II through XII grossly intact. DTRs 2+ bilaterally upper and lower extremities. Strength 5 out of 5 in bilateral upper and lower extremities. Musculoskeletal:Generalize body tenderness Psychiatric: Patient alert and oriented x3, good insight and cognition, good recent to remote recall. Lymph node survey: No cervical axillary or inguinal lymphadenopathy noted.  Lab  Results:  Basic Metabolic Panel:    Component Value Date/Time   NA 139 10/02/2024 0126   NA 138 08/28/2024 1127   K 4.2 10/02/2024 0126   CL 106 10/02/2024 0126   CO2 23 10/02/2024 0126   BUN 9 10/02/2024 0126   BUN 10 08/28/2024 1127   CREATININE 0.56 10/02/2024 0126   GLUCOSE 90 10/02/2024 0126   CALCIUM  8.6 (L) 10/02/2024 0126   CBC:    Component Value Date/Time   WBC 11.0 (H) 10/05/2024 0727   HGB 9.6 (L) 10/05/2024 0727   HGB 9.7 (L) 08/28/2024 1127   HCT 26.6 (L) 10/05/2024 0727   HCT 30.2 (L) 08/28/2024 1127   PLT 498 (H) 10/05/2024 0727   PLT 526 (H) 08/28/2024 1127   MCV 83.4 10/05/2024 0727   MCV 92 08/28/2024 1127   NEUTROABS 4.4 10/01/2024 1147   NEUTROABS 4.0 08/28/2024 1127   LYMPHSABS 4.1 (H) 10/01/2024 1147   LYMPHSABS 3.0 08/28/2024 1127   MONOABS 0.9 10/01/2024 1147   EOSABS 0.2 10/01/2024 1147   EOSABS 0.1 08/28/2024 1127   BASOSABS 0.0 10/01/2024 1147   BASOSABS 0.0 08/28/2024 1127    No results found for this or any previous visit (from the past 240 hours).  Studies/Results: No results found.   Medications: Scheduled Meds:  cyclobenzaprine   10 mg Oral QHS   enoxaparin  (LOVENOX ) injection  40 mg Subcutaneous Q24H   FLUoxetine   40 mg Oral Daily   folic acid   1 mg Oral Daily   gabapentin   100 mg Oral TID  HYDROmorphone    Intravenous Q4H   morphine   60 mg Oral Q12H   polyethylene glycol  17 g Oral Daily   QUEtiapine   25 mg Oral QHS   Continuous Infusions:   PRN Meds:.acetaminophen  **OR** acetaminophen , albuterol , bisacodyl , diphenhydrAMINE , HYDROcodone -acetaminophen , naloxone  **AND** sodium chloride  flush, nicotine , ondansetron  (ZOFRAN ) IV, oxyCODONE   Consultants: None  Procedures: None  Antibiotics: None  Assessment/Plan: Active Problems:   Anemia of chronic disease   Chronic musculoskeletal pain   Sickle cell pain crisis (HCC)   Tobacco use disorder, mild, abuse   GAD (generalized anxiety disorder)   Hb Sickle Cell  Disease with Pain crisis: gradually improving symptomatically.  Continue IVF 0.45% Saline @ KVO, continue weight based Dilaudid  PCA, IV Toradol  15 mg Q 6 H for a total of 5 days, continue oral home pain medications as ordered. Monitor vitals very closely, Re-evaluate pain scale regularly, 2 L of Oxygen  by [place].  Anemia of Chronic Disease:  Hemoglobin within patient's baseline, no clinical indication for transfusion. Will continue to monitor daily CBC.  Chronic pain Syndrome:  Continue oral home pain medication.  Tobacco use disorder: Encouraged smoking cessation. GAD ( generalized anxiety disorder): Patient is stable, denies suicidal ideations. Continue medications as prescribed and keep all follow up appointments as scheduled.   Code Status: Full Code Family Communication: N/A Disposition Plan: Not yet ready for discharge  [name] Cover NP   If 7PM-7AM, please contact night-coverage.  10/05/2024, 10:51 AM  LOS: 4 days

## 2024-10-06 DIAGNOSIS — D5700 Hb-SS disease with crisis, unspecified: Secondary | ICD-10-CM | POA: Diagnosis not present

## 2024-10-06 LAB — COMPREHENSIVE METABOLIC PANEL WITH GFR
ALT: 40 U/L (ref 0–44)
AST: 51 U/L — ABNORMAL HIGH (ref 15–41)
Albumin: 4.6 g/dL (ref 3.5–5.0)
Alkaline Phosphatase: 96 U/L (ref 38–126)
Anion gap: 11 (ref 5–15)
BUN: 17 mg/dL (ref 6–20)
CO2: 27 mmol/L (ref 22–32)
Calcium: 9.9 mg/dL (ref 8.9–10.3)
Chloride: 99 mmol/L (ref 98–111)
Creatinine, Ser: 0.54 mg/dL (ref 0.44–1.00)
GFR, Estimated: >60 mL/min (ref >60)
Glucose, Bld: 95 mg/dL (ref 70–99)
Potassium: 4.4 mmol/L (ref 3.5–5.1)
Sodium: 136 mmol/L (ref 135–145)
Total Bilirubin: 1.9 mg/dL — ABNORMAL HIGH (ref 0.0–1.2)
Total Protein: 8.9 g/dL — ABNORMAL HIGH (ref 6.5–8.1)

## 2024-10-06 LAB — CBC
HCT: 26.5 % — ABNORMAL LOW (ref 36.0–46.0)
Hemoglobin: 9.3 g/dL — ABNORMAL LOW (ref 12.0–15.0)
MCH: 29.4 pg (ref 26.0–34.0)
MCHC: 35.1 g/dL (ref 30.0–36.0)
MCV: 83.9 fL (ref 80.0–100.0)
Platelets: 506 K/uL — ABNORMAL HIGH (ref 150–400)
RBC: 3.16 MIL/uL — ABNORMAL LOW (ref 3.87–5.11)
RDW: 15.5 % (ref 11.5–15.5)
WBC: 14.2 K/uL — ABNORMAL HIGH (ref 4.0–10.5)
nRBC: 0.3 % — ABNORMAL HIGH (ref 0.0–0.2)

## 2024-10-06 MED ORDER — HYDROMORPHONE 1 MG/ML IV SOLN
INTRAVENOUS | Status: DC
  Administered 2024-10-06: 30 mg via INTRAVENOUS
  Administered 2024-10-06: 6.2 mg via INTRAVENOUS
  Administered 2024-10-06: 9 mg via INTRAVENOUS
  Administered 2024-10-07: 10.8 mg via INTRAVENOUS
  Administered 2024-10-07 (×2): 3.6 mg via INTRAVENOUS
  Filled 2024-10-06: qty 30

## 2024-10-06 NOTE — Plan of Care (Signed)
  Problem: Education: Goal: Knowledge of General Education information will improve Description: Including pain rating scale, medication(s)/side effects and non-pharmacologic comfort measures Outcome: Progressing   Problem: Health Behavior/Discharge Planning: Goal: Ability to manage health-related needs will improve Outcome: Progressing   Problem: Clinical Measurements: Goal: Will remain free from infection Outcome: Progressing Goal: Diagnostic test results will improve Outcome: Progressing Goal: Respiratory complications will improve Outcome: Progressing Goal: Cardiovascular complication will be avoided Outcome: Progressing   Problem: Clinical Measurements: Goal: Ability to maintain clinical measurements within normal limits will improve Outcome: Not Progressing

## 2024-10-06 NOTE — Progress Notes (Signed)
 Patient ID: Carmen [name], female   DOB: September 02, 1991, 33 y.o.   MRN: [id number] Subjective: Carmen [name] is a 33 y.o. female with medical history significant of sickle cell disease, chronic pain syndrome, sickle cell anemia, who presents to the emergency room complaining of worsening pain in her back and legs consistent with her typical sickle cell crisis.   Patient's pain remained uncontrolled despite IV Dilaudid  via PCA and other adjunct therapies.  Her pain is stuck at about 6/10, generalized.  She denies any nausea, vomiting or diarrhea.  No fever.  No cough or chest pain.  No shortness of breath.  No urinary symptoms.  Objective:  Vital signs in last 24 hours:  Vitals:   10/06/24 0427 10/06/24 0701 10/06/24 0832 10/06/24 1204  BP:  131/87    Pulse:  84    Resp: 15 20 18 12   Temp:  98.8 F (37.1 C)    TempSrc:      SpO2:  97%    Weight:      Height:        Intake/Output from previous day:  No intake or output data in the 24 hours ending 10/06/24 1254  Physical Exam: General: Alert, awake, oriented x3, in no acute distress.  HEENT: [place]/AT PEERL, EOMI Neck: Trachea midline,  no masses, no thyromegal,y no JVD, no carotid bruit OROPHARYNX:  Moist, No exudate/ erythema/lesions.  Heart: Regular rate and rhythm, without murmurs, rubs, gallops, PMI non-displaced, no heaves or thrills on palpation.  Lungs: Clear to auscultation, no wheezing or rhonchi noted. No increased vocal fremitus resonant to percussion  Abdomen: Soft, nontender, nondistended, positive bowel sounds, no masses no hepatosplenomegaly noted..  Neuro: No focal neurological deficits noted cranial nerves II through XII grossly intact. DTRs 2+ bilaterally upper and lower extremities. Strength 5 out of 5 in bilateral upper and lower extremities. Musculoskeletal: No warm swelling or erythema around joints, no spinal tenderness noted. Psychiatric: Patient alert and oriented x3, good insight and cognition, good recent to  remote recall. Lymph node survey: No cervical axillary or inguinal lymphadenopathy noted.  Lab Results:  Basic Metabolic Panel:    Component Value Date/Time   NA 136 10/06/2024 0752   NA 138 08/28/2024 1127   K 4.4 10/06/2024 0752   CL 99 10/06/2024 0752   CO2 27 10/06/2024 0752   BUN 17 10/06/2024 0752   BUN 10 08/28/2024 1127   CREATININE 0.54 10/06/2024 0752   GLUCOSE 95 10/06/2024 0752   CALCIUM  9.9 10/06/2024 0752   CBC:    Component Value Date/Time   WBC 14.2 (H) 10/06/2024 0752   HGB 9.3 (L) 10/06/2024 0752   HGB 9.7 (L) 08/28/2024 1127   HCT 26.5 (L) 10/06/2024 0752   HCT 30.2 (L) 08/28/2024 1127   PLT 506 (H) 10/06/2024 0752   PLT 526 (H) 08/28/2024 1127   MCV 83.9 10/06/2024 0752   MCV 92 08/28/2024 1127   NEUTROABS 4.4 10/01/2024 1147   NEUTROABS 4.0 08/28/2024 1127   LYMPHSABS 4.1 (H) 10/01/2024 1147   LYMPHSABS 3.0 08/28/2024 1127   MONOABS 0.9 10/01/2024 1147   EOSABS 0.2 10/01/2024 1147   EOSABS 0.1 08/28/2024 1127   BASOSABS 0.0 10/01/2024 1147   BASOSABS 0.0 08/28/2024 1127    No results found for this or any previous visit (from the past 240 hours).  Studies/Results: No results found.  Medications: Scheduled Meds:  cyclobenzaprine   10 mg Oral QHS   enoxaparin  (LOVENOX ) injection  40 mg Subcutaneous Q24H  FLUoxetine   40 mg Oral Daily   folic acid   1 mg Oral Daily   gabapentin   100 mg Oral TID   HYDROmorphone    Intravenous Q4H   morphine   60 mg Oral Q12H   polyethylene glycol  17 g Oral Daily   QUEtiapine   25 mg Oral QHS   Continuous Infusions: PRN Meds:.acetaminophen  **OR** acetaminophen , albuterol , bisacodyl , diphenhydrAMINE , HYDROcodone -acetaminophen , naloxone  **AND** sodium chloride  flush, nicotine , ondansetron  (ZOFRAN ) IV, oxyCODONE   Consultants: None  Procedures: None  Antibiotics: None  Assessment/Plan: Active Problems:   Anemia of chronic disease   Chronic musculoskeletal pain   Sickle cell pain crisis (HCC)    Tobacco use disorder, mild, abuse   GAD (generalized anxiety disorder)  Hb Sickle Cell Disease with Pain crisis: Continue IVF at KVO, increase weight based Dilaudid  PCA setting to 0.6/10/4, continue IV Toradol  15 mg Q 6 H for a total of 5 days, continue oral home pain medications as ordered. Monitor vitals very closely, Re-evaluate pain scale regularly, 2 L of Oxygen  by [place]. Patient encouraged to ambulate on the hallway today.  Leukocytosis: WBC slightly elevated, but there is no signs or symptoms of infection or inflammation.  Will continue to monitor very closely without antibiotics. Anemia of Chronic Disease: Hemoglobin remains stable at baseline, there is no clinical indication for blood transfusion at this time.  We will monitor very closely and transfuse as appropriate. Chronic pain Syndrome: Continue oral home pain medications as ordered. Generalized anxiety disorder: Clinically stable.  Patient denies any suicidal ideations or thoughts.  Will continue her oral home medications. Tobacco use disorder: Carmen [name] was counseled on the dangers of tobacco use, and was advised to quit. Reviewed strategies to maximize success, including removing cigarettes and smoking materials from environment, stress management and support of family/friends.   Code Status: Full Code Family Communication: N/A Disposition Plan: For possible discharge home tomorrow, 10/07/2024.  Cordel Drewes  If 7PM-7AM, please contact night-coverage.  10/06/2024, 12:54 PM  LOS: 5 days

## 2024-10-06 NOTE — Plan of Care (Signed)

## 2024-10-07 DIAGNOSIS — D5700 Hb-SS disease with crisis, unspecified: Secondary | ICD-10-CM | POA: Diagnosis not present

## 2024-10-07 DIAGNOSIS — D571 Sickle-cell disease without crisis: Secondary | ICD-10-CM

## 2024-10-07 DIAGNOSIS — Z79891 Long term (current) use of opiate analgesic: Secondary | ICD-10-CM | POA: Diagnosis not present

## 2024-10-07 MED ORDER — MORPHINE SULFATE ER 60 MG PO TBCR: 60.0000 mg | EXTENDED_RELEASE_TABLET | Freq: Two times a day (BID) | ORAL | 0 refills | Status: DC

## 2024-10-07 MED ORDER — OXYCODONE HCL 10 MG PO TABS: 10.0000 mg | ORAL_TABLET | Freq: Four times a day (QID) | ORAL | 0 refills | Status: AC | PRN

## 2024-10-07 NOTE — Discharge Summary (Signed)
 Physician Discharge Summary  Carmen [name] FMW:[id number] DOB: Mar 27, 1991 DOA: 10/01/2024  PCP: Paseda, Folashade R, FNP  Admit date: 10/01/2024  Discharge date: 10/07/2024  Discharge Diagnoses:  Active Problems:   Anemia of chronic disease   Chronic musculoskeletal pain   Sickle cell pain crisis (HCC)   Tobacco use disorder, mild, abuse   GAD (generalized anxiety disorder)   Discharge Condition: Stable  Disposition:   Follow-up Information     Paseda, Folashade R, FNP. Schedule an appointment as soon as possible for a visit in 1 week(s).   Specialty: Nurse Practitioner Contact information: [address] [place], [place] [address] [phone]                Pt is discharged home in good condition and is to follow up with Paseda, Folashade R, FNP this week to have labs evaluated. Carmen [name] is instructed to increase activity slowly and balance with rest for the next few days, and use prescribed medication to complete treatment of pain  Diet: Regular Wt Readings from Last 3 Encounters:  10/01/24 76.7 kg  09/01/24 72.2 kg  08/28/24 72.6 kg    History of present illness:  Carmen [name] is a 33 y.o. female with a PMH significant for sickle cell disease, depression, GAD. At baseline, they live independently and are independent with their ADLs.   They presented from home to the ED on 10/01/2024 with right shoulder and arm pain x 2 days.  Patient states that she took her home pain medications including 60 mg morphine  and 5 mg oxycodone  as instructed when she has flareup of her pain crises which initially improved her pain but as of this morning became intractable.  Pain initiated in her right shoulder and has since spread to her entire right arm.  Denies any chest pain or shortness of breath.  Her pain crisis location differs but has had flareups in the same location in the past.  She states that she has a crisis approximately 1-2 times per month which can be  triggered by menstruation or stress.  She is currently menstruating. She did call and attempt to be seen at her sickle cell clinic but they had no availability and recommended being seen at the emergency department.   In the ED, it was found that they had stable vital signs on room air.  Significant findings included: Stable metabolic panel.  CBC consistent with her baseline readings: WBC 9.7, hemoglobin 10.0, platelets 523.  Absolute reticulocyte count 231.   They were initially treated with several doses of Dilaudid , Toradol .    Patient was admitted to medicine service for further workup and management of sickle cell pain crisis as outlined in detail below.  Hospital Course:  Patient was admitted for sickle cell pain crisis and managed appropriately with IVF, IV Dilaudid  via PCA and IV Toradol , as well as other adjunct therapies per sickle cell pain management protocols.  Patient slowly improved on above regimen.  Her hemoglobin remained stable at baseline throughout this admission, she did not require blood transfusion.  Her generalized anxiety disorder was clinically stable, patient denied any suicidal ideations or thoughts.  As at today, patient is up and going, eating well with no restrictions and ambulating well with no significant pain.  She requested to be discharged home today and very good mood.  She requested pain medications refill, which was sent to her pharmacy for pickup. Patient was therefore discharged home today in a hemodynamically stable condition.  Carmen [name] will follow-up with PCP within 1 week of this discharge. Carmen [name] was counseled extensively about nonpharmacologic means of pain management, patient verbalized understanding and was appreciative of  the care received during this admission.   We discussed the need for good hydration, monitoring of hydration status, avoidance of heat, cold, stress, and infection triggers. We discussed the need to be adherent with taking Hydrea  and other  home medications. Patient was reminded of the need to seek medical attention immediately if any symptom of bleeding, anemia, or infection occurs.  Discharge Exam: Vitals:   10/07/24 0912 10/07/24 1006  BP: 114/76   Pulse: (!) 104   Resp: 14 14  Temp: 99 F (37.2 C)   SpO2: 98%    Vitals:   10/07/24 0303 10/07/24 0623 10/07/24 0912 10/07/24 1006  BP:  119/80 114/76   Pulse:  (!) 102 (!) 104   Resp: 14 18 14 14   Temp:  98 F (36.7 C) 99 F (37.2 C)   TempSrc:   Oral   SpO2:  96% 98%   Weight:      Height:       General appearance : Awake, alert, not in any distress. Speech Clear. Not toxic looking HEENT: Atraumatic and Normocephalic, pupils equally reactive to light and accomodation Neck: Supple, no JVD. No cervical lymphadenopathy.  Chest: Good air entry bilaterally, no added sounds  CVS: S1 S2 regular, no murmurs.  Abdomen: Bowel sounds present, Non tender and not distended with no gaurding, rigidity or rebound. Extremities: B/L Lower Ext shows no edema, both legs are warm to touch Neurology: Awake alert, and oriented X 3, CN II-XII intact, Non focal Skin: No Rash  Discharge Instructions  Discharge Instructions     Diet - low sodium heart healthy   Complete by: As directed    Increase activity slowly   Complete by: As directed       Allergies as of 10/07/2024       Reactions   Ketamine  Hives, Nausea And Vomiting, Other (See Comments)   Makes the patient feel funny also        Medication List     TAKE these medications    cyclobenzaprine  10 MG tablet Commonly known as: FLEXERIL  TAKE 1 TABLET BY MOUTH DAILY AT BEDTIME AS NEEDED FOR MUSCLE SPASMS   FLUoxetine  40 MG capsule Commonly known as: PROZAC  Take 1 capsule (40 mg total) by mouth daily.   folic acid  1 MG tablet Commonly known as: FOLVITE  Take 1 tablet (1 mg total) by mouth daily.   gabapentin  100 MG capsule Commonly known as: NEURONTIN  Take 1 capsule (100 mg total) by mouth 3 (three) times  daily.   lidocaine  5 % Commonly known as: Lidoderm  Place 1 patch onto the skin daily. Remove & Discard patch within 12 hours or as directed by MD   morphine  60 MG 12 hr tablet Commonly known as: MS CONTIN  Take 1 tablet (60 mg total) by mouth every 12 (twelve) hours.   naloxone  4 MG/0.1ML Liqd nasal spray kit Commonly known as: NARCAN  1 nasal spray in one nostril as needed for opoid overdose   nicotine  14 mg/24hr patch Commonly known as: NICODERM CQ  - dosed in mg/24 hours Place 1 patch (14 mg total) onto the skin daily.   Oxycodone  HCl 10 MG Tabs Take 1 tablet (10 mg total) by mouth every 6 (six) hours as needed for up to 15 days (pain).   QUEtiapine  25 MG tablet Commonly known as: SEROquel  Take  1 tablet (25 mg total) by mouth at bedtime.   Vitamin D  (Ergocalciferol ) 1.25 MG (50000 UNIT) Caps capsule Commonly known as: DRISDOL  TAKE 1 CAPSULE BY MOUTH ONE TIME PER WEEK What changed:  how much to take how to take this when to take this additional instructions        The results of significant diagnostics from this hospitalization (including imaging, microbiology, ancillary and laboratory) are listed below for reference.    Significant Diagnostic Studies: UE VENOUS DUPLEX (7am - 7pm) Result Date: 10/01/2024 UPPER VENOUS STUDY  Patient Name:  Carmen [name]  Date of Exam:   10/01/2024 Medical Rec #: [id number]       Accession #:    [id number] Date of Birth: [birth year]       Patient Gender: F Patient Age:   66 years Exam Location:  [facility] Procedure:      VAS US  UPPER EXTREMITY VENOUS DUPLEX Referring Phys: ROCKY MASSY --------------------------------------------------------------------------------  Indications: Pain Risk Factors: Sickle Cell Disease. Comparison Study: No previous exams Performing Technologist: Jody Hill RVT, RDMS  Examination Guidelines: A complete evaluation includes B-mode imaging, spectral Doppler, color Doppler, and power Doppler as needed of all  accessible portions of each vessel. Bilateral testing is considered an integral part of a complete examination. Limited examinations for reoccurring indications may be performed as noted.  Right Findings: +----------+------------+---------+-----------+----------+-------------------+ RIGHT     CompressiblePhasicitySpontaneousProperties      Summary       +----------+------------+---------+-----------+----------+-------------------+ IJV           Full       Yes       Yes                                  +----------+------------+---------+-----------+----------+-------------------+ Subclavian    Full       Yes       Yes                                  +----------+------------+---------+-----------+----------+-------------------+ Axillary      Full       Yes       Yes                                  +----------+------------+---------+-----------+----------+-------------------+ Brachial      Full       Yes       Yes                                  +----------+------------+---------+-----------+----------+-------------------+ Radial        Full                                                      +----------+------------+---------+-----------+----------+-------------------+ Ulnar         Full                                                      +----------+------------+---------+-----------+----------+-------------------+  Cephalic      Full                                  not well visualized +----------+------------+---------+-----------+----------+-------------------+ Basilic       Full       Yes       Yes                                  +----------+------------+---------+-----------+----------+-------------------+  Left Findings: +----------+------------+---------+-----------+----------+--------------------+ LEFT      CompressiblePhasicitySpontaneousProperties      Summary         +----------+------------+---------+-----------+----------+--------------------+ Subclavian               Yes       Yes                   patent by                                                              color/doppler     +----------+------------+---------+-----------+----------+--------------------+  Summary:  Right: No evidence of deep vein thrombosis in the upper extremity. No evidence of superficial vein thrombosis in the upper extremity.  Left: No evidence of thrombosis in the subclavian.  *See table(s) above for measurements and observations.  Diagnosing physician: Fonda Rim Electronically signed by Fonda Rim on 10/01/2024 at 7:01:39 PM.    Final     Microbiology: No results found for this or any previous visit (from the past 240 hours).   Labs: Basic Metabolic Panel: Recent Labs  Lab 10/01/24 1147 10/02/24 0126 10/06/24 0752  NA 139 139 136  K 3.9 4.2 4.4  CL 105 106 99  CO2 26 23 27   GLUCOSE 83 90 95  BUN 7 9 17   CREATININE 0.57 0.56 0.54  CALCIUM  8.8* 8.6* 9.9   Liver Function Tests: Recent Labs  Lab 10/01/24 1147 10/06/24 0752  AST 25 51*  ALT 19 40  ALKPHOS 65 96  BILITOT 1.3* 1.9*  PROT 7.1 8.9*  ALBUMIN 3.5 4.6   No results for input(s): LIPASE, AMYLASE in the last 168 hours. No results for input(s): AMMONIA in the last 168 hours. CBC: Recent Labs  Lab 10/01/24 1147 10/02/24 0126 10/04/24 1249 10/05/24 0727 10/06/24 0752  WBC 9.7 9.1 12.6* 11.0* 14.2*  NEUTROABS 4.4  --   --   --   --   HGB 10.0* 8.7* 9.8* 9.6* 9.3*  HCT 28.4* 24.4* 26.8* 26.6* 26.5*  MCV 85.0 84.1 82.2 83.4 83.9  PLT 523* 385 315 498* 506*   Cardiac Enzymes: No results for input(s): CKTOTAL, CKMB, CKMBINDEX, TROPONINI in the last 168 hours. BNP: Invalid input(s): POCBNP CBG: No results for input(s): GLUCAP in the last 168 hours.  Time coordinating discharge: 50 minutes  Signed:  Kosta Schnitzler  [facility] 10/07/2024, 11:12 AM

## 2024-10-08 ENCOUNTER — Telehealth: Payer: Self-pay

## 2024-10-08 ENCOUNTER — Ambulatory Visit (HOSPITAL_COMMUNITY): Payer: MEDICAID | Admitting: Mental Health

## 2024-10-08 ENCOUNTER — Encounter (HOSPITAL_COMMUNITY): Payer: Self-pay

## 2024-10-08 NOTE — Transitions of Care (Post Inpatient/ED Visit) (Signed)
   10/08/2024  Name: Carmen [name] MRN: [id number] DOB: [birth year]  Today's TOC FU Call Status: Today's TOC FU Call Status:: Unsuccessful Call (1st Attempt) Unsuccessful Call (1st Attempt) Date: 10/08/24  Attempted to reach the patient regarding the most recent Inpatient/ED visit.  Follow Up Plan: Additional outreach attempts will be made to reach the patient to complete the Transitions of Care (Post Inpatient/ED visit) call.   Arvin Seip RN, BSN, CCM [employer], Population Health Case Manager Phone: [phone]

## 2024-10-09 ENCOUNTER — Telehealth: Payer: Self-pay | Admitting: *Deleted

## 2024-10-09 NOTE — Transitions of Care (Post Inpatient/ED Visit) (Signed)
   10/09/2024  Name: Carmen [name] MRN: [id number] DOB: 1991-01-12  Today's TOC FU Call Status: Today's TOC FU Call Status:: Unsuccessful Call (2nd Attempt) Unsuccessful Call (2nd Attempt) Date: 10/09/24  Attempted to reach the patient regarding the most recent Inpatient/ED visit.  Follow Up Plan: Additional outreach attempts will be made to reach the patient to complete the Transitions of Care (Post Inpatient/ED visit) call.   Mliss Creed [facility], BSN RN Care Manager/ Transition of Care [place]/ [facility] [phone]

## 2024-10-10 ENCOUNTER — Telehealth: Payer: Self-pay | Admitting: *Deleted

## 2024-10-10 NOTE — Transitions of Care (Post Inpatient/ED Visit) (Signed)
° °  10/10/2024  Name: Carmen [name] MRN: [id number] DOB: Nov 08, 1990  Today's TOC FU Call Status: Today's TOC FU Call Status:: Unsuccessful Call (3rd Attempt) Unsuccessful Call (3rd Attempt) Date: 10/10/24  Attempted to reach the patient regarding the most recent Inpatient/ED visit.  Follow Up Plan: No further outreach attempts will be made at this time. We have been unable to contact the patient.  Mliss Creed [facility], BSN RN Care Manager/ Transition of Care [place]/ [facility] [phone]

## 2024-10-15 ENCOUNTER — Ambulatory Visit (HOSPITAL_COMMUNITY): Payer: MEDICAID | Admitting: Mental Health

## 2024-10-18 ENCOUNTER — Telehealth: Payer: Self-pay | Admitting: Nurse Practitioner

## 2024-10-18 NOTE — Telephone Encounter (Signed)
 Caller & Relationship to patient:    MRN #  [id number]   Call Back Number:   Date of Last Office Visit: 09/14/2024     Date of Next Office Visit: 11/28/2024   Medication(s) to be Refilled: OxyCodone  10mg    Preferred Pharmacy:   ** Please notify patient to allow 48-72 hours to process** **Let patient know to contact pharmacy at the end of the day to make sure medication is ready. ** **If patient has not been seen in a year or longer, book an appointment **Advise to use MyChart for refill requests OR to contact their pharmacy

## 2024-10-19 ENCOUNTER — Other Ambulatory Visit: Payer: Self-pay | Admitting: Nurse Practitioner

## 2024-10-19 DIAGNOSIS — D571 Sickle-cell disease without crisis: Secondary | ICD-10-CM

## 2024-10-19 DIAGNOSIS — Z79891 Long term (current) use of opiate analgesic: Secondary | ICD-10-CM

## 2024-10-19 MED ORDER — OXYCODONE HCL 10 MG PO TABS: 10.0000 mg | ORAL_TABLET | Freq: Four times a day (QID) | ORAL | 0 refills | Status: DC | PRN

## 2024-10-27 ENCOUNTER — Other Ambulatory Visit: Payer: Self-pay | Admitting: Nurse Practitioner

## 2024-10-27 DIAGNOSIS — F411 Generalized anxiety disorder: Secondary | ICD-10-CM

## 2024-10-30 NOTE — Telephone Encounter (Signed)
 Please advise [facility]

## 2024-11-02 ENCOUNTER — Non-Acute Institutional Stay (HOSPITAL_COMMUNITY)
Admission: AD | Admit: 2024-11-02 | Discharge: 2024-11-02 | Disposition: A | Discharge status: Home / Self Care | Payer: MEDICAID | Source: Ambulatory Visit | Attending: Internal Medicine | Admitting: Internal Medicine

## 2024-11-02 ENCOUNTER — Telehealth (HOSPITAL_COMMUNITY): Payer: Self-pay | Admitting: *Deleted

## 2024-11-02 DIAGNOSIS — D5700 Hb-SS disease with crisis, unspecified: Secondary | ICD-10-CM | POA: Insufficient documentation

## 2024-11-02 DIAGNOSIS — F32A Depression, unspecified: Secondary | ICD-10-CM | POA: Insufficient documentation

## 2024-11-02 DIAGNOSIS — F411 Generalized anxiety disorder: Secondary | ICD-10-CM | POA: Insufficient documentation

## 2024-11-02 LAB — CBC WITH DIFFERENTIAL/PLATELET
Abs Immature Granulocytes: 0.04 K/uL (ref 0.00–0.07)
Basophils Absolute: 0 K/uL (ref 0.0–0.1)
Basophils Relative: 1 %
Eosinophils Absolute: 0.1 K/uL (ref 0.0–0.5)
Eosinophils Relative: 2 %
HCT: 28.7 % — ABNORMAL LOW (ref 36.0–46.0)
Hemoglobin: 9.9 g/dL — ABNORMAL LOW (ref 12.0–15.0)
Immature Granulocytes: 1 %
Lymphocytes Relative: 35 %
Lymphs Abs: 2.2 K/uL (ref 0.7–4.0)
MCH: 30.2 pg (ref 26.0–34.0)
MCHC: 34.5 g/dL (ref 30.0–36.0)
MCV: 87.5 fL (ref 80.0–100.0)
Monocytes Absolute: 0.7 K/uL (ref 0.1–1.0)
Monocytes Relative: 11 %
Neutro Abs: 3.1 K/uL (ref 1.7–7.7)
Neutrophils Relative %: 50 %
Platelets: 460 K/uL — ABNORMAL HIGH (ref 150–400)
RBC: 3.28 MIL/uL — ABNORMAL LOW (ref 3.87–5.11)
RDW: 16 % — ABNORMAL HIGH (ref 11.5–15.5)
WBC: 6.1 K/uL (ref 4.0–10.5)
nRBC: 1.5 % — ABNORMAL HIGH (ref 0.0–0.2)

## 2024-11-02 LAB — COMPREHENSIVE METABOLIC PANEL WITH GFR
ALT: 27 U/L (ref 0–44)
AST: 33 U/L (ref 15–41)
Albumin: 4.2 g/dL (ref 3.5–5.0)
Alkaline Phosphatase: 75 U/L (ref 38–126)
Anion gap: 10 (ref 5–15)
BUN: 11 mg/dL (ref 6–20)
CO2: 25 mmol/L (ref 22–32)
Calcium: 9.5 mg/dL (ref 8.9–10.3)
Chloride: 108 mmol/L (ref 98–111)
Creatinine, Ser: 0.63 mg/dL (ref 0.44–1.00)
GFR, Estimated: >60 mL/min
Glucose, Bld: 83 mg/dL (ref 70–99)
Potassium: 4 mmol/L (ref 3.5–5.1)
Sodium: 143 mmol/L (ref 135–145)
Total Bilirubin: 0.8 mg/dL (ref 0.0–1.2)
Total Protein: 7.5 g/dL (ref 6.5–8.1)

## 2024-11-02 LAB — RETICULOCYTES
Immature Retic Fract: 38.1 % — ABNORMAL HIGH (ref 2.3–15.9)
RBC.: 3.28 MIL/uL — ABNORMAL LOW (ref 3.87–5.11)
Retic Count, Absolute: 237.8 K/uL — ABNORMAL HIGH (ref 19.0–186.0)
Retic Ct Pct: 7.3 % — ABNORMAL HIGH (ref 0.4–3.1)

## 2024-11-02 LAB — LACTATE DEHYDROGENASE: LDH: 237 U/L — ABNORMAL HIGH (ref 105–235)

## 2024-11-02 MED ORDER — NALOXONE HCL 0.4 MG/ML IJ SOLN: 0.4000 mg | INTRAMUSCULAR | Status: DC | PRN

## 2024-11-02 MED ORDER — ONDANSETRON HCL 4 MG/2ML IJ SOLN
4.0000 mg | Freq: Four times a day (QID) | INTRAMUSCULAR | Status: DC | PRN
  Administered 2024-11-02: 4 mg via INTRAVENOUS
  Filled 2024-11-02: qty 2

## 2024-11-02 MED ORDER — KETOROLAC TROMETHAMINE 15 MG/ML IJ SOLN
15.0000 mg | Freq: Four times a day (QID) | INTRAMUSCULAR | Status: DC
  Administered 2024-11-02: 15 mg via INTRAVENOUS
  Filled 2024-11-02: qty 1

## 2024-11-02 MED ORDER — ACETAMINOPHEN 500 MG PO TABS
1000.0000 mg | ORAL_TABLET | Freq: Once | ORAL | Status: AC
  Administered 2024-11-02: 1000 mg via ORAL
  Filled 2024-11-02: qty 2

## 2024-11-02 MED ORDER — SODIUM CHLORIDE 0.9% FLUSH: 9.0000 mL | INTRAVENOUS | Status: DC | PRN

## 2024-11-02 MED ORDER — HYDROMORPHONE 1 MG/ML IV SOLN
INTRAVENOUS | Status: DC
  Administered 2024-11-02: 30 mg via INTRAVENOUS
  Administered 2024-11-02: 11.5 mg via INTRAVENOUS
  Filled 2024-11-02: qty 30

## 2024-11-02 MED ORDER — MORPHINE SULFATE ER 60 MG PO TBCR
60.0000 mg | EXTENDED_RELEASE_TABLET | Freq: Two times a day (BID) | ORAL | Status: DC
  Administered 2024-11-02: 60 mg via ORAL
  Filled 2024-11-02: qty 1

## 2024-11-02 MED ORDER — SODIUM CHLORIDE 0.45 % IV SOLN: INTRAVENOUS | Status: DC

## 2024-11-02 MED ORDER — POLYETHYLENE GLYCOL 3350 17 G PO PACK: 17.0000 g | PACK | Freq: Every day | ORAL | Status: DC | PRN

## 2024-11-02 MED ORDER — DIPHENHYDRAMINE HCL 25 MG PO CAPS: 25.0000 mg | ORAL_CAPSULE | ORAL | Status: DC | PRN

## 2024-11-02 MED ORDER — SENNOSIDES-DOCUSATE SODIUM 8.6-50 MG PO TABS
1.0000 | ORAL_TABLET | Freq: Two times a day (BID) | ORAL | Status: DC
  Administered 2024-11-02: 1 via ORAL
  Filled 2024-11-02: qty 1

## 2024-11-02 NOTE — H&P (Signed)
 [facility] History and Physical  Carmen [name] FMW:[id number] DOB: [birth year] DOA: 11/02/2024  PCP: Paseda, Folashade R, FNP   Chief Complaint: No chief complaint on file.   HPI: Carmen [name] is a 34 y.o. female with history of sickle cell disease, depression, generalized anxiety at baseline.  Patient presented to the day hospital with generalized body ache and weakness.  Patient rates pain as 8/10 and.  Attributed symptoms to change in weather.  She denies fever, flulike symptoms, cough, nausea, vomiting, diarrhea.  No urinary symptoms.  No recent travels or sick contacts.  Systemic Review: General: The patient denies anorexia, fever, weight loss Cardiac: Denies chest pain, syncope, palpitations, pedal edema  Respiratory: Denies cough, shortness of breath, wheezing GI: Denies severe indigestion/heartburn, abdominal pain, nausea, vomiting, diarrhea and constipation GU: Denies hematuria, incontinence, dysuria  Musculoskeletal: Denies arthritis  Skin: Denies suspicious skin lesions Neurologic: Denies focal weakness or numbness, change in vision  Past Medical History:  Diagnosis Date   Anxiety    Headache(784.0)    Heart murmur    Sickle cell crisis (HCC)    Syphilis 2015   Was diagnosed and received one injection of antibiotics   Thrombocytosis 11/22/2014     CBC Latest Ref Rng & Units 12/13/2018 12/11/2018 12/10/2018 WBC 4.0 - 10.5 K/uL 14.8(H) 13.2(H) 15.9(H) Hemoglobin 12.0 - 15.0 g/dL 8.2(L) 7.7(L) 8.1(L) Hematocrit 36.0 - 46.0 % 23.7(L) 23.2(L) 24.5(L) Platelets 150 - 400 K/uL 326 399 388      Past Surgical History:  Procedure Laterality Date   CESAREAN SECTION N/A 02/05/2019   Procedure: CESAREAN SECTION;  Surgeon: Lorence Ozell CROME, MD;  Location: [facility];  Service: Obstetrics;  Laterality: N/A;   CHOLECYSTECTOMY N/A 11/30/2014   Procedure: LAPAROSCOPIC CHOLECYSTECTOMY SINGLE SITE WITH INTRAOPERATIVE CHOLANGIOGRAM;  Surgeon: Elspeth Schultze, MD;  Location: [facility] ORS;   Service: General;  Laterality: N/A;   SPLENECTOMY      Allergies[1]  Family History  Problem Relation Age of Onset   Hypertension Mother    Sickle cell anemia Sister    Kidney disease Sister        Lupus   Arthritis Sister    Sickle cell anemia Sister    Sickle cell trait Sister    Heart disease Maternal Aunt        CABG   Heart disease Maternal Uncle        CABG   Lupus Sister       Prior to Admission medications  Medication Sig Start Date End Date Taking? Authorizing Provider  cyclobenzaprine  (FLEXERIL ) 10 MG tablet TAKE 1 TABLET BY MOUTH DAILY AT BEDTIME AS NEEDED FOR MUSCLE SPASMS 07/30/24   Paseda, Folashade R, FNP  FLUoxetine  (PROZAC ) 40 MG capsule TAKE 1 CAPSULE (40 MG TOTAL) BY MOUTH DAILY. 10/30/24   Paseda, Folashade R, FNP  folic acid  (FOLVITE ) 1 MG tablet Take 1 tablet (1 mg total) by mouth daily. 12/27/23   Paseda, Folashade R, FNP  gabapentin  (NEURONTIN ) 100 MG capsule Take 1 capsule (100 mg total) by mouth 3 (three) times daily. 09/12/24   Harl Zane BRAVO, NP  lidocaine  (LIDODERM ) 5 % Place 1 patch onto the skin daily. Remove & Discard patch within 12 hours or as directed by MD Patient not taking: Reported on 10/02/2024 09/06/24   Kingsley, Victoria K, DO  morphine  (MS CONTIN ) 60 MG 12 hr tablet Take 1 tablet (60 mg total) by mouth every 12 (twelve) hours. 10/07/24 11/06/24  [name], [name], MD  naloxone  (NARCAN ) nasal spray 4 mg/0.1 mL 1 nasal spray in one nostril as needed for opoid overdose 05/28/24   Paseda, Folashade R, FNP  nicotine  (NICODERM CQ  - DOSED IN MG/24 HOURS) 14 mg/24hr patch Place 1 patch (14 mg total) onto the skin daily. Patient not taking: Reported on 09/01/2024 08/28/24   Paseda, Folashade R, FNP  Oxycodone  HCl 10 MG TABS Take 1 tablet (10 mg total) by mouth every 6 (six) hours as needed for up to 15 days (pain). 10/22/24 11/06/24  Paseda, Folashade R, FNP  QUEtiapine  (SEROQUEL ) 25 MG tablet Take 1 tablet (25 mg total) by mouth at  bedtime. Patient not taking: Reported on 10/02/2024 09/12/24   Harl Regan E, NP  Vitamin D , Ergocalciferol , (DRISDOL ) 1.25 MG (50000 UNIT) CAPS capsule TAKE 1 CAPSULE BY MOUTH ONE TIME PER WEEK Patient taking differently: Take 50,000 Units by mouth every 7 (seven) days. Every Wednesday 12/27/23   Paseda, Folashade R, FNP     Physical Exam: Vitals:   11/02/24 1129 11/02/24 1314 11/02/24 1538 11/02/24 1611  BP:  113/65 (!) 103/59   Pulse:  61 (!) 57   Resp: 12 14 10 15   Temp:      TempSrc:      SpO2:  97% 98%     General: Alert, awake, afebrile, anicteric, not in obvious distress HEENT: Normocephalic and Atraumatic, Mucous membranes pink                PERRLA; EOM intact; No scleral icterus,                 Nares: Patent, Oropharynx: Clear, Fair Dentition                 Neck: FROM, no cervical lymphadenopathy, thyromegaly, carotid bruit or JVD;  CHEST WALL: No tenderness  CHEST: Normal respiration, clear to auscultation bilaterally  HEART: Regular rate and rhythm; no murmurs rubs or gallops  BACK: No kyphosis or scoliosis; no CVA tenderness  ABDOMEN: Positive Bowel Sounds, soft, non-tender; no masses, no organomegaly EXTREMITIES: No cyanosis, clubbing, or edema SKIN:  no rash or ulceration  CNS: Alert and Oriented x 4, Nonfocal exam, CN 2-12 intact  Labs on Admission:  Basic Metabolic Panel: Recent Labs  Lab 11/02/24 1110  NA 143  K 4.0  CL 108  CO2 25  GLUCOSE 83  BUN 11  CREATININE 0.63  CALCIUM  9.5   Liver Function Tests: Recent Labs  Lab 11/02/24 1110  AST 33  ALT 27  ALKPHOS 75  BILITOT 0.8  PROT 7.5  ALBUMIN 4.2   No results for input(s): LIPASE, AMYLASE in the last 168 hours. No results for input(s): AMMONIA in the last 168 hours. CBC: Recent Labs  Lab 11/02/24 1110  WBC 6.1  NEUTROABS 3.1  HGB 9.9*  HCT 28.7*  MCV 87.5  PLT 460*   Cardiac Enzymes: No results for input(s): CKTOTAL, CKMB, CKMBINDEX, TROPONINI in the last  168 hours.  BNP (last 3 results) No results for input(s): BNP in the last 8760 hours.  ProBNP (last 3 results) No results for input(s): PROBNP in the last 8760 hours.  CBG: No results for input(s): GLUCAP in the last 168 hours.   Assessment/Plan Principal Problem:   Sickle cell anemia with pain (HCC)  Admits to the Day Hospital for extended observation IVF 0.45% Saline @ 125 mls/hour Weight based Dilaudid  PCA started within 30 minutes of admission Acetaminophen  1000 mg x 1 dose Labs: CBCD, CMP, Retic Count and  LDH Monitor vitals very closely, Re-evaluate pain scale every hour 2 L of Oxygen  by [place] Patient will be re-evaluated for pain in the context of function and relationship to baseline as care progresses. If no significant relieve from pain (remains above 5/10) will transfer patient to inpatient services for further evaluation and management  Code Status: Full  Family Communication: None  DVT Prophylaxis: Ambulate as tolerated   Time spent: 35 Minutes  [name] Cover NP  If 7PM-7AM, please contact night-coverage [email or web address] 11/02/2024, 5:53 PM     [1]  Allergies Allergen Reactions   Ketamine  Hives, Nausea And Vomiting and Other (See Comments)    Makes the patient feel funny also

## 2024-11-02 NOTE — Progress Notes (Signed)
 Patient admitted to the day infusion hospital for sickle cell pain crisis. Initially, patient reported bilateral leg pain rated 8/10. For pain management, patient placed on Sickle Cell Dose Dilaudid  PCA, given 15 mg  IV Toradol , 1000 mg PO Tylenol  and hydrated with IV fluids. Patient also given a dose of her 60 mg MS Contin  prior to discharge. At discharge, patient rated pain at 6/10. Vital signs stable. Patient alert, oriented and ambulatory at discharge.

## 2024-11-02 NOTE — Discharge Instructions (Signed)
 SABRA

## 2024-11-02 NOTE — Discharge Summary (Signed)
 " Physician Discharge Summary  Carmen [name] FMW:[id number] DOB: 05-04-1991 DOA: 11/02/2024  PCP: Paseda, Folashade R, FNP  Admit date: 11/02/2024  Discharge date: 11/02/2024  Time spent: 30 minutes  Discharge Diagnoses:  Principal Problem:   Sickle cell anemia with pain [facility])   Discharge Condition: Stable  Diet recommendation: Regular  History of present illness:  Carmen [name] is a 34 y.o. female with history of sickle cell disease, depression, generalized anxiety at baseline.  Patient presented to the day hospital with generalized body ache and weakness.  Patient rates pain as 8/10 and.  Attributed symptoms to change in weather.  She denies fever, flulike symptoms, cough, nausea, vomiting, diarrhea.  No urinary symptoms.  No recent travels or sick contacts.    Hospital Course:  Carmen [name] was admitted to the day hospital with sickle cell painful crisis. Patient was treated with IV fluid, weight based IV Dilaudid  PCA, clinician assisted doses as deemed appropriate, and other adjunct therapies per sickle cell pain management protocol. Beonca showed improvement symptomatically, pain improved from 8/10 to 6/10 at the time of discharge. Patient was discharged home in a hemodynamically stable condition. Thursa will follow-up at the clinic as previously scheduled, continue with home medications as per prior to admission.  Discharge Instructions We discussed the need for good hydration, monitoring of hydration status, avoidance of heat, cold, stress, and infection triggers. We discussed the need to be compliant with taking her home medications. Dannon was reminded of the need to seek medical attention immediately if any symptom of bleeding, anemia, or infection occurs.  Discharge Exam: Vitals:   11/02/24 1314 11/02/24 1538  BP: 113/65 (!) 103/59  Pulse: 61 (!) 57  Resp: 14 10  Temp:    SpO2: 97% 98%    General appearance: alert, cooperative and no distress Eyes: conjunctivae/corneas  clear. PERRL, EOM's intact. Fundi benign. Neck: no adenopathy, no carotid bruit, no JVD, supple, symmetrical, trachea midline and thyroid not enlarged, symmetric, no tenderness/mass/nodules Back: symmetric, no curvature. ROM normal. No CVA tenderness. Resp: clear to auscultation bilaterally Chest wall: no tenderness Cardio: regular rate and rhythm, S1, S2 normal, no murmur, click, rub or gallop GI: soft, non-tender; bowel sounds normal; no masses,  no organomegaly Extremities: extremities normal, atraumatic, no cyanosis or edema Pulses: 2+ and symmetric Skin: Skin color, texture, turgor normal. No rashes or lesions Neurologic: Grossly normal  Discharge Instructions     Call MD for:  persistant nausea and vomiting   Complete by: As directed    Call MD for:  severe uncontrolled pain   Complete by: As directed    Call MD for:  temperature >100.4   Complete by: As directed    Increase activity slowly   Complete by: As directed       Allergies as of 11/02/2024       Reactions   Ketamine  Hives, Nausea And Vomiting, Other (See Comments)   Makes the patient feel funny also        Medication List     TAKE these medications    cyclobenzaprine  10 MG tablet Commonly known as: FLEXERIL  TAKE 1 TABLET BY MOUTH DAILY AT BEDTIME AS NEEDED FOR MUSCLE SPASMS   FLUoxetine  40 MG capsule Commonly known as: PROZAC  TAKE 1 CAPSULE (40 MG TOTAL) BY MOUTH DAILY.   folic acid  1 MG tablet Commonly known as: FOLVITE  Take 1 tablet (1 mg total) by mouth daily.   gabapentin  100 MG capsule Commonly known as: NEURONTIN  Take 1 capsule (  100 mg total) by mouth 3 (three) times daily.   lidocaine  5 % Commonly known as: Lidoderm  Place 1 patch onto the skin daily. Remove & Discard patch within 12 hours or as directed by MD   morphine  60 MG 12 hr tablet Commonly known as: MS CONTIN  Take 1 tablet (60 mg total) by mouth every 12 (twelve) hours.   naloxone  4 MG/0.1ML Liqd nasal spray kit Commonly  known as: NARCAN  1 nasal spray in one nostril as needed for opoid overdose   nicotine  14 mg/24hr patch Commonly known as: NICODERM CQ  - dosed in mg/24 hours Place 1 patch (14 mg total) onto the skin daily.   Oxycodone  HCl 10 MG Tabs Take 1 tablet (10 mg total) by mouth every 6 (six) hours as needed for up to 15 days (pain).   QUEtiapine  25 MG tablet Commonly known as: SEROquel  Take 1 tablet (25 mg total) by mouth at bedtime.   Vitamin D  (Ergocalciferol ) 1.25 MG (50000 UNIT) Caps capsule Commonly known as: DRISDOL  TAKE 1 CAPSULE BY MOUTH ONE TIME PER WEEK What changed:  how much to take how to take this when to take this additional instructions       Allergies[1]   Significant Diagnostic Studies: No results found.  Signed:  [name] Cover NP  11/02/2024, 3:56 PM     [1]  Allergies Allergen Reactions   Ketamine  Hives, Nausea And Vomiting and Other (See Comments)    Makes the patient feel funny also   "

## 2024-11-02 NOTE — Telephone Encounter (Signed)
 Patient called requesting to come to the day hospital for sickle cell pain. Patient reports bilateral leg pain rated 9/10. Reports taking Oxycodone  and Morphine  for pain last night at 10:00 pm. Denies fever, chest pain, nausea, vomiting, diarrhea and abdominal pain. Admits to having transportation without driving self. Onyeje, NP notified and advised that patient can come to the day hospital for pain management. Patient advised and expresses an understanding.

## 2024-11-03 ENCOUNTER — Inpatient Hospital Stay (HOSPITAL_COMMUNITY)
Admission: EM | Admit: 2024-11-03 | Discharge: 2024-11-09 | DRG: 812 | Disposition: A | Discharge status: Home / Self Care | Payer: MEDICAID | Attending: Internal Medicine | Admitting: Internal Medicine

## 2024-11-03 ENCOUNTER — Encounter (HOSPITAL_COMMUNITY): Payer: Self-pay

## 2024-11-03 ENCOUNTER — Other Ambulatory Visit: Payer: Self-pay

## 2024-11-03 DIAGNOSIS — Z8261 Family history of arthritis: Secondary | ICD-10-CM

## 2024-11-03 DIAGNOSIS — D571 Sickle-cell disease without crisis: Secondary | ICD-10-CM

## 2024-11-03 DIAGNOSIS — D638 Anemia in other chronic diseases classified elsewhere: Secondary | ICD-10-CM | POA: Diagnosis present

## 2024-11-03 DIAGNOSIS — G894 Chronic pain syndrome: Secondary | ICD-10-CM | POA: Diagnosis present

## 2024-11-03 DIAGNOSIS — Z79899 Other long term (current) drug therapy: Secondary | ICD-10-CM | POA: Diagnosis not present

## 2024-11-03 DIAGNOSIS — R509 Fever, unspecified: Secondary | ICD-10-CM | POA: Diagnosis present

## 2024-11-03 DIAGNOSIS — Z5982 Transportation insecurity: Secondary | ICD-10-CM | POA: Diagnosis not present

## 2024-11-03 DIAGNOSIS — Z59868 Other specified financial insecurity: Secondary | ICD-10-CM

## 2024-11-03 DIAGNOSIS — D5700 Hb-SS disease with crisis, unspecified: Secondary | ICD-10-CM | POA: Diagnosis present

## 2024-11-03 DIAGNOSIS — F411 Generalized anxiety disorder: Secondary | ICD-10-CM | POA: Diagnosis present

## 2024-11-03 DIAGNOSIS — F329 Major depressive disorder, single episode, unspecified: Secondary | ICD-10-CM | POA: Diagnosis present

## 2024-11-03 DIAGNOSIS — M7918 Myalgia, other site: Secondary | ICD-10-CM | POA: Diagnosis present

## 2024-11-03 DIAGNOSIS — Z8249 Family history of ischemic heart disease and other diseases of the circulatory system: Secondary | ICD-10-CM | POA: Diagnosis not present

## 2024-11-03 DIAGNOSIS — Z5941 Food insecurity: Secondary | ICD-10-CM | POA: Diagnosis not present

## 2024-11-03 DIAGNOSIS — Z888 Allergy status to other drugs, medicaments and biological substances status: Secondary | ICD-10-CM | POA: Diagnosis not present

## 2024-11-03 DIAGNOSIS — F1120 Opioid dependence, uncomplicated: Secondary | ICD-10-CM | POA: Diagnosis present

## 2024-11-03 DIAGNOSIS — F17290 Nicotine dependence, other tobacco product, uncomplicated: Secondary | ICD-10-CM | POA: Diagnosis present

## 2024-11-03 DIAGNOSIS — Z9049 Acquired absence of other specified parts of digestive tract: Secondary | ICD-10-CM

## 2024-11-03 DIAGNOSIS — Z832 Family history of diseases of the blood and blood-forming organs and certain disorders involving the immune mechanism: Secondary | ICD-10-CM

## 2024-11-03 DIAGNOSIS — G8929 Other chronic pain: Secondary | ICD-10-CM | POA: Diagnosis present

## 2024-11-03 DIAGNOSIS — Z8419 Family history of other disorders of kidney and ureter: Secondary | ICD-10-CM | POA: Diagnosis not present

## 2024-11-03 DIAGNOSIS — Z59819 Housing instability, housed unspecified: Secondary | ICD-10-CM

## 2024-11-03 DIAGNOSIS — Z9081 Acquired absence of spleen: Secondary | ICD-10-CM

## 2024-11-03 DIAGNOSIS — Z5948 Other specified lack of adequate food: Secondary | ICD-10-CM

## 2024-11-03 DIAGNOSIS — Z8269 Family history of other diseases of the musculoskeletal system and connective tissue: Secondary | ICD-10-CM | POA: Diagnosis not present

## 2024-11-03 DIAGNOSIS — Z79891 Long term (current) use of opiate analgesic: Secondary | ICD-10-CM

## 2024-11-03 LAB — CBC WITH DIFFERENTIAL/PLATELET
Abs Immature Granulocytes: 0.09 K/uL — ABNORMAL HIGH (ref 0.00–0.07)
Basophils Absolute: 0 K/uL (ref 0.0–0.1)
Basophils Relative: 0 %
Eosinophils Absolute: 0.1 K/uL (ref 0.0–0.5)
Eosinophils Relative: 1 %
HCT: 26.6 % — ABNORMAL LOW (ref 36.0–46.0)
Hemoglobin: 9.4 g/dL — ABNORMAL LOW (ref 12.0–15.0)
Immature Granulocytes: 1 %
Lymphocytes Relative: 20 %
Lymphs Abs: 2.1 K/uL (ref 0.7–4.0)
MCH: 30.1 pg (ref 26.0–34.0)
MCHC: 35.3 g/dL (ref 30.0–36.0)
MCV: 85.3 fL (ref 80.0–100.0)
Monocytes Absolute: 0.9 K/uL (ref 0.1–1.0)
Monocytes Relative: 9 %
Neutro Abs: 7 K/uL (ref 1.7–7.7)
Neutrophils Relative %: 69 %
Platelets: 497 K/uL — ABNORMAL HIGH (ref 150–400)
RBC: 3.12 MIL/uL — ABNORMAL LOW (ref 3.87–5.11)
RDW: 15.7 % — ABNORMAL HIGH (ref 11.5–15.5)
WBC: 10.2 K/uL (ref 4.0–10.5)
nRBC: 0.6 % — ABNORMAL HIGH (ref 0.0–0.2)

## 2024-11-03 LAB — COMPREHENSIVE METABOLIC PANEL WITH GFR
ALT: 36 U/L (ref 0–44)
AST: 42 U/L — ABNORMAL HIGH (ref 15–41)
Albumin: 4.5 g/dL (ref 3.5–5.0)
Alkaline Phosphatase: 83 U/L (ref 38–126)
Anion gap: 12 (ref 5–15)
BUN: 11 mg/dL (ref 6–20)
CO2: 25 mmol/L (ref 22–32)
Calcium: 9.6 mg/dL (ref 8.9–10.3)
Chloride: 103 mmol/L (ref 98–111)
Creatinine, Ser: 0.61 mg/dL (ref 0.44–1.00)
GFR, Estimated: >60 mL/min
Glucose, Bld: 106 mg/dL — ABNORMAL HIGH (ref 70–99)
Potassium: 3.7 mmol/L (ref 3.5–5.1)
Sodium: 139 mmol/L (ref 135–145)
Total Bilirubin: 1.2 mg/dL (ref 0.0–1.2)
Total Protein: 7.9 g/dL (ref 6.5–8.1)

## 2024-11-03 LAB — RETICULOCYTES
Immature Retic Fract: 33 % — ABNORMAL HIGH (ref 2.3–15.9)
RBC.: 3.08 MIL/uL — ABNORMAL LOW (ref 3.87–5.11)
Retic Count, Absolute: 207.6 K/uL — ABNORMAL HIGH (ref 19.0–186.0)
Retic Ct Pct: 6.7 % — ABNORMAL HIGH (ref 0.4–3.1)

## 2024-11-03 LAB — HCG, SERUM, QUALITATIVE: Preg, Serum: NEGATIVE

## 2024-11-03 MED ORDER — DIPHENHYDRAMINE HCL 25 MG PO CAPS
25.0000 mg | ORAL_CAPSULE | ORAL | Status: DC | PRN
  Administered 2024-11-03: 50 mg via ORAL
  Filled 2024-11-03: qty 2

## 2024-11-03 MED ORDER — SENNOSIDES-DOCUSATE SODIUM 8.6-50 MG PO TABS
1.0000 | ORAL_TABLET | Freq: Two times a day (BID) | ORAL | Status: DC
  Administered 2024-11-03 – 2024-11-09 (×13): 1 via ORAL
  Filled 2024-11-03 (×13): qty 1

## 2024-11-03 MED ORDER — FOLIC ACID 1 MG PO TABS
1.0000 mg | ORAL_TABLET | Freq: Every day | ORAL | Status: DC
  Administered 2024-11-03 – 2024-11-09 (×7): 1 mg via ORAL
  Filled 2024-11-03 (×7): qty 1

## 2024-11-03 MED ORDER — HYDROMORPHONE HCL 1 MG/ML IJ SOLN
2.0000 mg | INTRAMUSCULAR | Status: AC
  Administered 2024-11-03: 2 mg via INTRAVENOUS
  Filled 2024-11-03: qty 2

## 2024-11-03 MED ORDER — SODIUM CHLORIDE 0.9% FLUSH: 9.0000 mL | INTRAVENOUS | Status: DC | PRN

## 2024-11-03 MED ORDER — ONDANSETRON HCL 4 MG/2ML IJ SOLN: 4.0000 mg | INTRAMUSCULAR | Status: DC | PRN

## 2024-11-03 MED ORDER — HYDROMORPHONE HCL 1 MG/ML IJ SOLN
2.0000 mg | Freq: Once | INTRAMUSCULAR | Status: AC
  Administered 2024-11-03: 2 mg via INTRAVENOUS
  Filled 2024-11-03: qty 2

## 2024-11-03 MED ORDER — MORPHINE SULFATE ER 30 MG PO TBCR
60.0000 mg | EXTENDED_RELEASE_TABLET | Freq: Two times a day (BID) | ORAL | Status: DC
  Administered 2024-11-03 – 2024-11-09 (×13): 60 mg via ORAL
  Filled 2024-11-03 (×12): qty 2
  Filled 2024-11-03: qty 4

## 2024-11-03 MED ORDER — POLYETHYLENE GLYCOL 3350 17 G PO PACK: 17.0000 g | PACK | Freq: Every day | ORAL | Status: DC | PRN

## 2024-11-03 MED ORDER — KETOROLAC TROMETHAMINE 15 MG/ML IJ SOLN
15.0000 mg | INTRAMUSCULAR | Status: AC
  Administered 2024-11-03: 15 mg via INTRAVENOUS
  Filled 2024-11-03: qty 1

## 2024-11-03 MED ORDER — CYCLOBENZAPRINE HCL 5 MG PO TABS: 5.0000 mg | ORAL_TABLET | Freq: Three times a day (TID) | ORAL | Status: DC | PRN

## 2024-11-03 MED ORDER — HYDROMORPHONE HCL 1 MG/ML IJ SOLN
2.0000 mg | INTRAMUSCULAR | Status: DC | PRN
  Administered 2024-11-03: 2 mg via INTRAVENOUS
  Filled 2024-11-03: qty 2

## 2024-11-03 MED ORDER — DIPHENHYDRAMINE HCL 25 MG PO CAPS: 25.0000 mg | ORAL_CAPSULE | ORAL | Status: DC | PRN

## 2024-11-03 MED ORDER — ORAL CARE MOUTH RINSE: 15.0000 mL | OROMUCOSAL | Status: DC | PRN

## 2024-11-03 MED ORDER — ENOXAPARIN SODIUM 40 MG/0.4ML IJ SOSY
40.0000 mg | PREFILLED_SYRINGE | INTRAMUSCULAR | Status: DC
  Filled 2024-11-03 (×4): qty 0.4

## 2024-11-03 MED ORDER — HYDROMORPHONE 1 MG/ML IV SOLN
INTRAVENOUS | Status: DC
  Administered 2024-11-03: 8.5 mg via INTRAVENOUS
  Administered 2024-11-03: 30 mg via INTRAVENOUS
  Administered 2024-11-04: 2 mg via INTRAVENOUS
  Administered 2024-11-04: 7 mg via INTRAVENOUS
  Administered 2024-11-04: 2.5 mg via INTRAVENOUS
  Administered 2024-11-04: 7.5 mg via INTRAVENOUS
  Administered 2024-11-04: 8.5 mg via INTRAVENOUS
  Administered 2024-11-04: 3 mg via INTRAVENOUS
  Administered 2024-11-04: 7 mg via INTRAVENOUS
  Administered 2024-11-05: 4 mg via INTRAVENOUS
  Administered 2024-11-05: 8.5 mg via INTRAVENOUS
  Administered 2024-11-05: 30 mg via INTRAVENOUS
  Administered 2024-11-05: 3 mg via INTRAVENOUS
  Administered 2024-11-05: 9.5 mg via INTRAVENOUS
  Administered 2024-11-05: 30 mg via INTRAVENOUS
  Administered 2024-11-06: 0.1 mg via INTRAVENOUS
  Administered 2024-11-06: 3.5 mg via INTRAVENOUS
  Administered 2024-11-06: 8.5 mg via INTRAVENOUS
  Administered 2024-11-06: 6 mg via INTRAVENOUS
  Administered 2024-11-06: 6.5 mg via INTRAVENOUS
  Administered 2024-11-06: 2 mg via INTRAVENOUS
  Administered 2024-11-06: 6.5 mg via INTRAVENOUS
  Administered 2024-11-06: 30 mg via INTRAVENOUS
  Administered 2024-11-07: 8 mg via INTRAVENOUS
  Administered 2024-11-07: 4.5 mg via INTRAVENOUS
  Administered 2024-11-07: 7.5 mg via INTRAVENOUS
  Administered 2024-11-07: 8 mg via INTRAVENOUS
  Administered 2024-11-07: 4.5 mg via INTRAVENOUS
  Administered 2024-11-07: 30 mg via INTRAVENOUS
  Administered 2024-11-08: 1.5 mg via INTRAVENOUS
  Administered 2024-11-08: 3.5 mg via INTRAVENOUS
  Administered 2024-11-08: 6.2 mg via INTRAVENOUS
  Administered 2024-11-08: 3.5 mg via INTRAVENOUS
  Administered 2024-11-08: 5 mg via INTRAVENOUS
  Administered 2024-11-08: 6 mg via INTRAVENOUS
  Administered 2024-11-09: 9 mg via INTRAVENOUS
  Administered 2024-11-09: 30 mg via INTRAVENOUS
  Filled 2024-11-03 (×8): qty 30

## 2024-11-03 MED ORDER — SODIUM CHLORIDE 0.45 % IV SOLN: INTRAVENOUS | Status: AC

## 2024-11-03 MED ORDER — ONDANSETRON HCL 4 MG/2ML IJ SOLN: 4.0000 mg | Freq: Four times a day (QID) | INTRAMUSCULAR | Status: DC | PRN

## 2024-11-03 MED ORDER — FLUOXETINE HCL 20 MG PO CAPS
40.0000 mg | ORAL_CAPSULE | Freq: Every day | ORAL | Status: DC
  Administered 2024-11-03 – 2024-11-09 (×7): 40 mg via ORAL
  Filled 2024-11-03 (×7): qty 2

## 2024-11-03 MED ORDER — KETOROLAC TROMETHAMINE 15 MG/ML IJ SOLN
15.0000 mg | Freq: Four times a day (QID) | INTRAMUSCULAR | Status: AC
  Administered 2024-11-03 – 2024-11-07 (×19): 15 mg via INTRAVENOUS
  Filled 2024-11-03 (×20): qty 1

## 2024-11-03 MED ORDER — GABAPENTIN 100 MG PO CAPS
100.0000 mg | ORAL_CAPSULE | Freq: Three times a day (TID) | ORAL | Status: DC
  Administered 2024-11-03 – 2024-11-09 (×19): 100 mg via ORAL
  Filled 2024-11-03 (×19): qty 1

## 2024-11-03 MED ORDER — NALOXONE HCL 0.4 MG/ML IJ SOLN: 0.4000 mg | INTRAMUSCULAR | Status: DC | PRN

## 2024-11-03 NOTE — ED Provider Notes (Signed)
 " [facility]ENCY [facility]PT [facility] Emergency Department Provider Note MRN:  [id number]  Arrival date & time: 11/03/2024     Chief Complaint   Sickle Cell Pain Crisis   History of Present Illness   Carmen [name] is a 34 y.o. year-old female with a history of sickle cell anemia presenting to the ED with chief complaint of sickle cell pain.  Pain to the arms and legs, worst in the bilateral arms.  Denies chest pain or shortness of breath, no abdominal pain, no fever.  Feels like a sickle cell pain crisis.  Has been trying her home medications as directed over the past few days, not helping.  Review of Systems  A thorough review of systems was obtained and all systems are negative except as noted in the HPI and PMH.   Patient's Health History    Past Medical History:  Diagnosis Date   Anxiety    Headache(784.0)    Heart murmur    Sickle cell crisis (HCC)    Syphilis 2015   Was diagnosed and received one injection of antibiotics   Thrombocytosis 11/22/2014     CBC Latest Ref Rng & Units 12/13/2018 12/11/2018 12/10/2018 WBC 4.0 - 10.5 K/uL 14.8(H) 13.2(H) 15.9(H) Hemoglobin 12.0 - 15.0 g/dL 8.2(L) 7.7(L) 8.1(L) Hematocrit 36.0 - 46.0 % 23.7(L) 23.2(L) 24.5(L) Platelets 150 - 400 K/uL 326 399 388      Past Surgical History:  Procedure Laterality Date   CESAREAN SECTION N/A 02/05/2019   Procedure: CESAREAN SECTION;  Surgeon: Lorence Ozell CROME, MD;  Location: [facility] LD [facility]S;  Service: Obstetrics;  Laterality: N/A;   CHOLECYSTECTOMY N/A 11/30/2014   Procedure: LAPAROSCOPIC CHOLECYSTECTOMY SINGLE SITE WITH INTRAOPERATIVE CHOLANGIOGRAM;  Surgeon: Elspeth Schultze, MD;  Location: [facility] ORS;  Service: General;  Laterality: N/A;   SPLENECTOMY      Family History  Problem Relation Age of Onset   Hypertension Mother    Sickle cell anemia Sister    Kidney disease Sister        Lupus   Arthritis Sister    Sickle cell anemia Sister    Sickle cell trait Sister    Heart disease Maternal Aunt        CABG    Heart disease Maternal Uncle        CABG   Lupus Sister     Social History   Socioeconomic History   Marital status: Single    Spouse name: Not on file   Number of children: 1   Years of education: Not on file   Highest education level: 12th grade  Occupational History   Not on file  Tobacco Use   Smoking status: Some Days    Types: Cigarettes   Smokeless tobacco: Never  Vaping Use   Vaping status: Every Day  Substance and Sexual Activity   Alcohol use: No   Drug use: Yes    Types: Marijuana    Comment: marijuana   Sexual activity: Yes    Partners: Male    Birth control/protection: None    Comment: menarche 34yo, sexual debut 34yo  Other Topics Concern   Not on file  Social History Narrative   Lives with her mother and her son   Social Drivers of Health   Tobacco Use: High Risk (11/03/2024)   Patient History    Smoking Tobacco Use: Some Days    Smokeless Tobacco Use: Never    Passive Exposure: Not on file  Financial Resource Strain: Low Risk (08/28/2024)  Overall Financial Resource Strain (CARDIA)    Difficulty of Paying Living Expenses: Not very hard  Food Insecurity: No Food Insecurity (10/02/2024)   Epic    Worried About Programme Researcher, Broadcasting/film/video in the Last Year: Never true    Ran Out of Food in the Last Year: Never true  Recent Concern: Food Insecurity - Food Insecurity Present (09/01/2024)   Epic    Worried About Programme Researcher, Broadcasting/film/video in the Last Year: Sometimes true    [employer] of Food in the Last Year: Sometimes true  Transportation Needs: No Transportation Needs (10/02/2024)   Epic    Lack of Transportation (Medical): No    Lack of Transportation (Non-Medical): No  Recent Concern: Transportation Needs - Unmet Transportation Needs (09/01/2024)   Epic    Lack of Transportation (Medical): Yes    Lack of Transportation (Non-Medical): Yes  Physical Activity: Sufficiently Active (08/28/2024)   Exercise Vital Sign    Days of Exercise per Week: 3 days    Minutes of  Exercise per Session: 60 min  Stress: Stress Concern Present (08/28/2024)   [employer] of Occupational Health - Occupational Stress Questionnaire    Feeling of Stress: Very much  Social Connections: Socially Isolated (08/28/2024)   Social Connection and Isolation Panel    Frequency of Communication with Friends and Family: More than three times a week    Frequency of Social Gatherings with Friends and Family: Twice a week    Attends Religious Services: Patient declined    Active Member of Clubs or Organizations: No    Attends Engineer, Structural: Not on file    Marital Status: Never married  Intimate Partner Violence: Not At Risk (10/02/2024)   Epic    Fear of Current or Ex-Partner: No    Emotionally Abused: No    Physically Abused: No    Sexually Abused: No  Depression (PHQ2-9): High Risk (09/12/2024)   Depression (PHQ2-9)    PHQ-2 Score: 24  Alcohol Screen: Low Risk (08/28/2024)   Alcohol Screen    Last Alcohol Screening Score (AUDIT): 0  Housing: High Risk (10/02/2024)   Epic    Unable to Pay for Housing in the Last Year: Yes    Number of Times Moved in the Last Year: 0    Homeless in the Last Year: No  Utilities: Not At Risk (10/02/2024)   Epic    Threatened with loss of utilities: No  Health Literacy: Not on file     Physical Exam   Vitals:   11/03/24 0446  BP: (!) 140/90  Pulse: 61  Resp: 18  Temp: 98.1 F (36.7 C)  SpO2: 100%    CONSTITUTIONAL: Well-appearing, NAD NEURO/PSYCH:  Alert and oriented x 3, no focal deficits EYES:  eyes equal and reactive ENT/NECK:  no LAD, no JVD CARDIO: Regular rate, well-perfused, normal S1 and S2 PULM:  CTAB no wheezing or rhonchi GI/GU:  non-distended, non-tender MSK/SPINE:  No gross deformities, no edema SKIN:  no rash, atraumatic   *Additional and/or pertinent findings included in MDM below  Diagnostic and Interventional Summary    EKG Interpretation Date/Time:    Ventricular Rate:    PR  Interval:    QRS Duration:    QT Interval:    QTC Calculation:   R Axis:      Text Interpretation:         Labs Reviewed  COMPREHENSIVE METABOLIC PANEL WITH GFR - Abnormal; Notable for the following components:  Result Value   Glucose, Bld 106 (*)    AST 42 (*)    All other components within normal limits  CBC WITH DIFFERENTIAL/PLATELET - Abnormal; Notable for the following components:   RBC 3.12 (*)    Hemoglobin 9.4 (*)    HCT 26.6 (*)    RDW 15.7 (*)    Platelets 497 (*)    nRBC 0.6 (*)    Abs Immature Granulocytes 0.09 (*)    All other components within normal limits  RETICULOCYTES - Abnormal; Notable for the following components:   Retic Ct Pct 6.7 (*)    RBC. 3.08 (*)    Retic Count, Absolute 207.6 (*)    Immature Retic Fract 33.0 (*)    All other components within normal limits  HCG, SERUM, QUALITATIVE    No orders to display    Medications  diphenhydrAMINE  (BENADRYL ) capsule 25-50 mg (50 mg Oral Given 11/03/24 0530)  ondansetron  (ZOFRAN ) injection 4 mg (has no administration in time range)  0.45 % sodium chloride  infusion ( Intravenous New Bag/Given 11/03/24 0529)  ketorolac  (TORADOL ) 15 MG/ML injection 15 mg (15 mg Intravenous Given 11/03/24 0529)  HYDROmorphone  (DILAUDID ) injection 2 mg (2 mg Intravenous Given 11/03/24 0523)  HYDROmorphone  (DILAUDID ) injection 2 mg (2 mg Intravenous Given 11/03/24 0553)  HYDROmorphone  (DILAUDID ) injection 2 mg (2 mg Intravenous Given 11/03/24 9376)     Procedures  /  Critical Care Procedures  ED Course and Medical Decision Making  Initial Impression and Ddx Pain crisis versus chronic pain, patient does appear uncomfortable, reassuring vital signs, no chest pain or shortness of breath to suggest acute chest, no fever.  Providing pain control and will reassess.  Past medical/surgical history that increases complexity of ED encounter: Sickle cell anemia  Interpretation of Diagnostics I personally reviewed the Laboratory Testing  and my interpretation is as follows: No significant blood count or electrolyte disturbance.    Patient Reassessment and Ultimate Disposition/Management     Patient still having considerable pain.  Requesting hospitalist admission.  Patient management required discussion with the following services or consulting groups:  Hospitalist Service  Complexity of Problems Addressed Acute illness or injury that poses threat of life of bodily function  Additional Data Reviewed and Analyzed Further history obtained from: Prior labs/imaging results  Additional Factors Impacting ED Encounter Risk Consideration of hospitalization  Ozell HERO. Theadore, MD [facility] Emergency Medicine [facility] [email or web address]  Final Clinical Impressions(s) / ED Diagnoses     ICD-10-CM   1. Sickle cell pain crisis [facility])  D57.00       ED Discharge Orders     None        Discharge Instructions Discussed with and Provided to Patient:   Discharge Instructions   None      Theadore Ozell HERO, MD 11/03/24 [phone]  "

## 2024-11-03 NOTE — H&P (Signed)
 H&P  Patient Demographics:  Carmen [name], is a 34 y.o. female  MRN: [id number]   DOB - 1991/06/20  Admit Date - 11/03/2024  Outpatient Primary MD for the patient is Paseda, Folashade R, FNP  Chief Complaint  Patient presents with   Sickle Cell Pain Crisis      HPI:   Carmen [name]  is a 34 y.o. female with a medical history significant for sickle cell disease, chronic pain syndrome, opiate dependence and tolerance, anemia of chronic disease, and major depressive disorder presents with complaints of ongoing pain to low back, upper and lower extremities for greater than 5 days.  Patient states that pain intensity has been increasing over the past 5 days.  She attributes current pain crisis to changes in weather and stressors at home.  She says that she has been attempting to manage her pain crisis at home without success.  She last had MS Contin  and oxycodone  this a.m. without very much relief.  She was treated and evaluated at the sickle cell day infusion center on 11/02/2024.  She states that she attempted to manage at home, however her pain intensity worsened.  She rates her pain as 8/10, constant, and throbbing.  She says that pain intensity has improved some since initiating pain medications in the emergency department. Patient denies any headache, chest pain, shortness of breath, urinary symptoms, nausea, vomiting, or diarrhea.  She has not had any sick contacts or recent travel.  Emergency room course: The vital signs show temperature 98.3 F, BP 115/76, pulse rate 56, respirations 16, and oxygen  saturation 99% on room air. Complete blood count shows WBCs 10.2, hemoglobin 9.4 g/dL, and platelets 502,999.  Absolute reticulocytes 207.6.  Comprehensive metabolic panel unremarkable.  Patient's pain persisted despite IV Toradol , IV Dilaudid  x 3 doses, and IV fluids.  Patient admitted for further management of her sickle cell pain crisis.  Review of systems:  Review of Systems  Constitutional:  Negative.  Negative for chills and fever.  HENT: Negative.    Eyes: Negative.   Respiratory: Negative.    Cardiovascular: Negative.   Gastrointestinal: Negative.   Genitourinary: Negative.   Musculoskeletal:  Positive for back pain and joint pain.  Skin: Negative.   Neurological: Negative.   Psychiatric/Behavioral: Negative.      With Past History of the following :   Past Medical History:  Diagnosis Date   Anxiety    Headache(784.0)    Heart murmur    Sickle cell crisis (HCC)    Syphilis 2015   Was diagnosed and received one injection of antibiotics   Thrombocytosis 11/22/2014     CBC Latest Ref Rng & Units 12/13/2018 12/11/2018 12/10/2018 WBC 4.0 - 10.5 K/uL 14.8(H) 13.2(H) 15.9(H) Hemoglobin 12.0 - 15.0 g/dL 8.2(L) 7.7(L) 8.1(L) Hematocrit 36.0 - 46.0 % 23.7(L) 23.2(L) 24.5(L) Platelets 150 - 400 K/uL 326 399 388        Past Surgical History:  Procedure Laterality Date   CESAREAN SECTION N/A 02/05/2019   Procedure: CESAREAN SECTION;  Surgeon: Lorence Ozell CROME, MD;  Location: [facility];  Service: Obstetrics;  Laterality: N/A;   CHOLECYSTECTOMY N/A 11/30/2014   Procedure: LAPAROSCOPIC CHOLECYSTECTOMY SINGLE SITE WITH INTRAOPERATIVE CHOLANGIOGRAM;  Surgeon: Elspeth Schultze, MD;  Location: [facility];  Service: General;  Laterality: N/A;   SPLENECTOMY       Social History:   Social History   Tobacco Use   Smoking status: Some Days    Types: Cigarettes   Smokeless tobacco: Never  Substance  Use Topics   Alcohol use: No     Lives - At home   Family History :   Family History  Problem Relation Age of Onset   Hypertension Mother    Sickle cell anemia Sister    Kidney disease Sister        Lupus   Arthritis Sister    Sickle cell anemia Sister    Sickle cell trait Sister    Heart disease Maternal Aunt        CABG   Heart disease Maternal Uncle        CABG   Lupus Sister      Home Medications:   Prior to Admission medications  Medication Sig Start Date End Date Taking?  Authorizing Provider  cyclobenzaprine  (FLEXERIL ) 10 MG tablet TAKE 1 TABLET BY MOUTH DAILY AT BEDTIME AS NEEDED FOR MUSCLE SPASMS 07/30/24  Yes Paseda, Folashade R, FNP  FLUoxetine  (PROZAC ) 40 MG capsule TAKE 1 CAPSULE (40 MG TOTAL) BY MOUTH DAILY. 10/30/24  Yes Paseda, Folashade R, FNP  folic acid  (FOLVITE ) 1 MG tablet Take 1 tablet (1 mg total) by mouth daily. 12/27/23  Yes Paseda, Folashade R, FNP  gabapentin  (NEURONTIN ) 100 MG capsule Take 1 capsule (100 mg total) by mouth 3 (three) times daily. 09/12/24  Yes Harl Regan E, NP  morphine  (MS CONTIN ) 60 MG 12 hr tablet Take 1 tablet (60 mg total) by mouth every 12 (twelve) hours. 10/07/24 11/06/24 Yes [name], [name], MD  naloxone  (NARCAN ) nasal spray 4 mg/0.1 mL 1 nasal spray in one nostril as needed for opoid overdose 05/28/24  Yes Paseda, Folashade R, FNP  Oxycodone  HCl 10 MG TABS Take 1 tablet (10 mg total) by mouth every 6 (six) hours as needed for up to 15 days (pain). 10/22/24 11/06/24 Yes Paseda, Folashade R, FNP  Vitamin D , Ergocalciferol , (DRISDOL ) 1.25 MG (50000 UNIT) CAPS capsule TAKE 1 CAPSULE BY MOUTH ONE TIME PER WEEK 12/27/23  Yes Paseda, Folashade R, FNP     Allergies:   Allergies[1]   Physical Exam:   Vitals:   Vitals:   11/03/24 1344 11/03/24 1402  BP: 130/81   Pulse: (!) 47   Resp: 16 19  Temp: 98 F (36.7 C)   SpO2: 100%     Physical Exam: Constitutional: Patient appears well-developed and well-nourished. Not in obvious distress. HENT: Normocephalic, atraumatic, External right and left ear normal. Oropharynx is clear and moist.  Eyes: Conjunctivae and EOM are normal. PERRLA, no scleral icterus. Neck: Normal ROM. Neck supple. No JVD. No tracheal deviation. No thyromegaly. CVS: RRR, S1/S2 +, no murmurs, no gallops, no carotid bruit.  Pulmonary: Effort and breath sounds normal, no stridor, rhonchi, wheezes, rales.  Abdominal: Soft. BS +, no distension, tenderness, rebound or guarding.  Musculoskeletal:  Normal range of motion. No edema and no tenderness.  Lymphadenopathy: No lymphadenopathy noted, cervical, inguinal or axillary Neuro: Alert. Normal reflexes, muscle tone coordination. No cranial nerve deficit. Skin: Skin is warm and dry. No rash noted. Not diaphoretic. No erythema. No pallor. Psychiatric: Normal mood and affect. Behavior, judgment, thought content normal.   Data Review:   CBC Recent Labs  Lab 11/02/24 1110 11/03/24 0535  WBC 6.1 10.2  HGB 9.9* 9.4*  HCT 28.7* 26.6*  PLT 460* 497*  MCV 87.5 85.3  MCH 30.2 30.1  MCHC 34.5 35.3  RDW 16.0* 15.7*  LYMPHSABS 2.2 2.1  MONOABS 0.7 0.9  EOSABS 0.1 0.1  BASOSABS 0.0 0.0   ------------------------------------------------------------------------------------------------------------------  Chemistries  Recent Labs  Lab 11/02/24 1110 11/03/24 0535  NA 143 139  K 4.0 3.7  CL 108 103  CO2 25 25  GLUCOSE 83 106*  BUN 11 11  CREATININE 0.63 0.61  CALCIUM  9.5 9.6  AST 33 42*  ALT 27 36  ALKPHOS 75 83  BILITOT 0.8 1.2   ------------------------------------------------------------------------------------------------------------------ estimated creatinine clearance is 100.2 mL/min (by C-G formula based on SCr of 0.61 mg/dL). ------------------------------------------------------------------------------------------------------------------ No results for input(s): TSH, T4TOTAL, T3FREE, THYROIDAB in the last 72 hours.  Invalid input(s): FREET3  Coagulation profile No results for input(s): INR, PROTIME in the last 168 hours. ------------------------------------------------------------------------------------------------------------------- No results for input(s): DDIMER in the last 72 hours. -------------------------------------------------------------------------------------------------------------------  Cardiac Enzymes No results for input(s): CKMB, TROPONINI, MYOGLOBIN in the last 168  hours.  Invalid input(s): CK ------------------------------------------------------------------------------------------------------------------ No results found for: BNP  ---------------------------------------------------------------------------------------------------------------  Urinalysis    Component Value Date/Time   COLORURINE YELLOW 07/31/2024 1039   APPEARANCEUR CLEAR 07/31/2024 1039   LABSPEC 1.012 07/31/2024 1039   PHURINE 6.0 07/31/2024 1039   GLUCOSEU NEGATIVE 07/31/2024 1039   HGBUR NEGATIVE 07/31/2024 1039   BILIRUBINUR NEGATIVE 07/31/2024 1039   BILIRUBINUR negative 08/26/2021 1527   BILIRUBINUR Negative 08/10/2019 1534   KETONESUR NEGATIVE 07/31/2024 1039   PROTEINUR NEGATIVE 07/31/2024 1039   UROBILINOGEN 1.0 08/26/2021 1527   UROBILINOGEN 1.0 01/12/2018 0943   NITRITE NEGATIVE 07/31/2024 1039   LEUKOCYTESUR NEGATIVE 07/31/2024 1039    ----------------------------------------------------------------------------------------------------------------   Imaging Results:    No results found.   Assessment & Plan:  Principal Problem:   Sickle cell anemia with crisis (HCC) Active Problems:   Anemia of chronic disease   Chronic musculoskeletal pain   Major depressive disorder   Chronic pain syndrome  Sickle cell disease with pain crisis: Admit.  Initiate IV Dilaudid  PCA with settings of 0.5 mg, 10-minute lockout, and 3 mg/h. Continue home medications, MS Contin  60 mg every 12 hours.  Hold oxycodone , will restart when pain intensity improves. Toradol  15 mg IV every 6 hours for total of 5 days Monitor vital signs very closely and reevaluate pain scale regularly Supplemental oxygen  as needed Patient will be reevaluated for pain in the context of function and relationship to baseline as care progresses.  Anemia of chronic disease: Patient's hemoglobin is stable and consistent with her baseline.  There is no clinical indication for blood transfusion at this  time.  Continue to monitor closely.  Labs in AM.  Chronic pain syndrome: Continue home medications  Major depression disorder: Stable.  Continue home medication.  No HI or SI.   DVT Prophylaxis: Subcut Lovenox  and SCDs  AM Labs Ordered, also please review Full Orders  Family Communication: Admission, patient's condition and plan of care including tests being ordered have been discussed with the patient who indicate understanding and agree with the plan and Code Status.  Code Status: Full Code  Consults called: None    Admission status: Inpatient    Time spent in minutes : 40 minutes  [name]  DNP, APRN, FNP-C [facility] [address] [place], [place] [address] [phone]  11/03/2024 at 2:31 PM      [1]  Allergies Allergen Reactions   Ketamine  Hives, Nausea And Vomiting and Other (See Comments)    Makes the patient feel funny also

## 2024-11-03 NOTE — ED Triage Notes (Signed)
 Pt. Arrives POV for Sickle cell pain. Reports pain to the arms legs and back x3 days. Denies chest pain and SOB. Last took pain meds at 6pm

## 2024-11-03 NOTE — Plan of Care (Signed)
" °  Problem: Education: Goal: Knowledge of vaso-occlusive preventative measures will improve Outcome: Progressing Goal: Awareness of infection prevention will improve Outcome: Progressing Goal: Awareness of signs and symptoms of anemia will improve Outcome: Progressing Goal: Long-term complications will improve Outcome: Progressing   Problem: Self-Care: Goal: Ability to incorporate actions that prevent/reduce pain crisis will improve Outcome: Progressing   Problem: Bowel/Gastric: Goal: Gut motility will be maintained Outcome: Progressing   Problem: Fluid Volume: Goal: Ability to maintain a balanced intake and output will improve Outcome: Progressing   Problem: Sensory: Goal: Pain level will decrease with appropriate interventions Outcome: Progressing   Problem: Clinical Measurements: Goal: Ability to maintain clinical measurements within normal limits will improve Outcome: Progressing Goal: Will remain free from infection Outcome: Progressing Goal: Diagnostic test results will improve Outcome: Progressing Goal: Respiratory complications will improve Outcome: Progressing Goal: Cardiovascular complication will be avoided Outcome: Progressing   "

## 2024-11-04 DIAGNOSIS — D5700 Hb-SS disease with crisis, unspecified: Secondary | ICD-10-CM | POA: Diagnosis not present

## 2024-11-04 LAB — COMPREHENSIVE METABOLIC PANEL WITH GFR
ALT: 31 U/L (ref 0–44)
AST: 32 U/L (ref 15–41)
Albumin: 4.5 g/dL (ref 3.5–5.0)
Alkaline Phosphatase: 87 U/L (ref 38–126)
Anion gap: 10 (ref 5–15)
BUN: 11 mg/dL (ref 6–20)
CO2: 27 mmol/L (ref 22–32)
Calcium: 9.7 mg/dL (ref 8.9–10.3)
Chloride: 101 mmol/L (ref 98–111)
Creatinine, Ser: 0.53 mg/dL (ref 0.44–1.00)
GFR, Estimated: >60 mL/min
Glucose, Bld: 99 mg/dL (ref 70–99)
Potassium: 3.9 mmol/L (ref 3.5–5.1)
Sodium: 138 mmol/L (ref 135–145)
Total Bilirubin: 1.5 mg/dL — ABNORMAL HIGH (ref 0.0–1.2)
Total Protein: 7.6 g/dL (ref 6.5–8.1)

## 2024-11-04 LAB — CBC
HCT: 24.5 % — ABNORMAL LOW (ref 36.0–46.0)
Hemoglobin: 8.9 g/dL — ABNORMAL LOW (ref 12.0–15.0)
MCH: 30.5 pg (ref 26.0–34.0)
MCHC: 36.3 g/dL — ABNORMAL HIGH (ref 30.0–36.0)
MCV: 83.9 fL (ref 80.0–100.0)
Platelets: 395 K/uL (ref 150–400)
RBC: 2.92 MIL/uL — ABNORMAL LOW (ref 3.87–5.11)
RDW: 16.4 % — ABNORMAL HIGH (ref 11.5–15.5)
WBC: 11.8 K/uL — ABNORMAL HIGH (ref 4.0–10.5)
nRBC: 0.8 % — ABNORMAL HIGH (ref 0.0–0.2)

## 2024-11-04 MED ORDER — MELATONIN 5 MG PO TABS
5.0000 mg | ORAL_TABLET | Freq: Once | ORAL | Status: DC | PRN
  Filled 2024-11-04: qty 1

## 2024-11-04 MED ORDER — OXYCODONE HCL 5 MG PO TABS
10.0000 mg | ORAL_TABLET | Freq: Four times a day (QID) | ORAL | Status: DC | PRN
  Administered 2024-11-06 – 2024-11-09 (×11): 10 mg via ORAL
  Filled 2024-11-04 (×11): qty 2

## 2024-11-04 NOTE — Plan of Care (Signed)
" °  Problem: Sensory: Goal: Pain level will decrease with appropriate interventions Outcome: Progressing   Problem: Education: Goal: Knowledge of General Education information will improve Description: Including pain rating scale, medication(s)/side effects and non-pharmacologic comfort measures Outcome: Progressing   Problem: Activity: Goal: Risk for activity intolerance will decrease Outcome: Progressing   Problem: Nutrition: Goal: Adequate nutrition will be maintained Outcome: Progressing   Problem: Pain Managment: Goal: General experience of comfort will improve and/or be controlled Outcome: Progressing   "

## 2024-11-04 NOTE — Plan of Care (Signed)

## 2024-11-04 NOTE — Progress Notes (Signed)
 Subjective: Carmen [name] is a 34 year old female with a medical history significant for sickle cell disease, chronic pain syndrome, opiate dependence and tolerance, history of anemia of chronic disease, and history of major depressive disorder was admitted for sickle cell pain crisis. She states that pain intensity has not improved very much overnight.  Pain is primarily to her right arm, low back and lower extremities.  She rates pain as 7/10.  She denies any headache, chest pain, shortness of breath, urinary symptoms, nausea, vomiting, or diarrhea.  Objective:  Vital signs in last 24 hours:  Vitals:   11/04/24 0435 11/04/24 0459 11/04/24 0616 11/04/24 0711  BP: 123/69     Pulse: 90     Resp: 18 10 13 11   Temp: 98 F (36.7 C)     TempSrc: Oral     SpO2: 90%     Weight:      Height:        Intake/Output from previous day:   Intake/Output Summary (Last 24 hours) at 11/04/2024 1009 Last data filed at 11/04/2024 0900 Gross per 24 hour  Intake 1114.05 ml  Output --  Net 1114.05 ml    Physical Exam: General: Alert, awake, oriented x3, in no acute distress.  HEENT: [place]/AT PEERL, EOMI Neck: Trachea midline,  no masses, no thyromegal,y no JVD, no carotid bruit OROPHARYNX:  Moist, No exudate/ erythema/lesions.  Heart: Regular rate and rhythm, without murmurs, rubs, gallops, PMI non-displaced, no heaves or thrills on palpation.  Lungs: Clear to auscultation, no wheezing or rhonchi noted. No increased vocal fremitus resonant to percussion  Abdomen: Soft, nontender, nondistended, positive bowel sounds, no masses no hepatosplenomegaly noted..  Neuro: No focal neurological deficits noted cranial nerves II through XII grossly intact. DTRs 2+ bilaterally upper and lower extremities. Strength 5 out of 5 in bilateral upper and lower extremities. Musculoskeletal: No warm swelling or erythema around joints,  Psychiatric: Patient alert and oriented x3, good insight and cognition, good recent to  remote recall. Lymph node survey: No cervical axillary or inguinal lymphadenopathy noted.  Lab Results:  Basic Metabolic Panel:    Component Value Date/Time   NA 139 11/03/2024 0535   NA 138 08/28/2024 1127   K 3.7 11/03/2024 0535   CL 103 11/03/2024 0535   CO2 25 11/03/2024 0535   BUN 11 11/03/2024 0535   BUN 10 08/28/2024 1127   CREATININE 0.61 11/03/2024 0535   GLUCOSE 106 (H) 11/03/2024 0535   CALCIUM  9.6 11/03/2024 0535   CBC:    Component Value Date/Time   WBC 10.2 11/03/2024 0535   HGB 9.4 (L) 11/03/2024 0535   HGB 9.7 (L) 08/28/2024 1127   HCT 26.6 (L) 11/03/2024 0535   HCT 30.2 (L) 08/28/2024 1127   PLT 497 (H) 11/03/2024 0535   PLT 526 (H) 08/28/2024 1127   MCV 85.3 11/03/2024 0535   MCV 92 08/28/2024 1127   NEUTROABS 7.0 11/03/2024 0535   NEUTROABS 4.0 08/28/2024 1127   LYMPHSABS 2.1 11/03/2024 0535   LYMPHSABS 3.0 08/28/2024 1127   MONOABS 0.9 11/03/2024 0535   EOSABS 0.1 11/03/2024 0535   EOSABS 0.1 08/28/2024 1127   BASOSABS 0.0 11/03/2024 0535   BASOSABS 0.0 08/28/2024 1127    No results found for this or any previous visit (from the past 240 hours).  Studies/Results: No results found.  Medications: Scheduled Meds:  enoxaparin  (LOVENOX ) injection  40 mg Subcutaneous Q24H   FLUoxetine   40 mg Oral Daily   folic acid   1 mg Oral Daily  gabapentin   100 mg Oral TID   HYDROmorphone    Intravenous Q4H   ketorolac   15 mg Intravenous Q6H   morphine   60 mg Oral Q12H   senna-docusate  1 tablet Oral BID   Continuous Infusions: PRN Meds:.cyclobenzaprine , diphenhydrAMINE , naloxone  **AND** sodium chloride  flush, ondansetron  (ZOFRAN ) IV, mouth rinse, polyethylene glycol  Consultants: none  Procedures: none  Antibiotics: none  Assessment/Plan: Principal Problem:   Sickle cell anemia with crisis (HCC) Active Problems:   Anemia of chronic disease   Chronic musculoskeletal pain   Major depressive disorder   Chronic pain syndrome  Sickle cell  disease with pain crisis: Decrease IV fluids to KVO Continue IV Dilaudid  PCA without changes Continue home medications Monitor vital signs very closely, reevaluate pain scale regularly, and supplemental oxygen  as needed  Chronic pain syndrome: Continue home medications  Anemia of chronic disease: Stable.  Hemoglobin is consistent with patient's baseline.  There is no clinical indication for blood transfusion at this time.  Monitor closely.  Major depressive disorder: Stable.  No HI or SI.  Continue home medications.   Code Status: Full Code Family Communication: N/A Disposition Plan: Not yet ready for discharge  [name]  DNP, APRN, FNP-C [facility] [address] [place], [place] [address] [phone]  If 7PM-7AM, please contact night-coverage.  11/04/2024, 10:09 AM  LOS: 1 day

## 2024-11-05 MED ORDER — MELATONIN 5 MG PO TABS
5.0000 mg | ORAL_TABLET | Freq: Once | ORAL | Status: AC | PRN
  Administered 2024-11-05: 5 mg via ORAL
  Filled 2024-11-05: qty 1

## 2024-11-05 NOTE — Progress Notes (Signed)
 Patient ID: Carmen [name], female   DOB: August 09, 1991, 34 y.o.   MRN: [id number] Subjective: Carmen [name] is a 34 year old female with a medical history significant for sickle cell disease, chronic pain syndrome, opiate dependence and tolerance, history of anemia of chronic disease, and history of major depressive disorder was admitted for sickle cell pain crisis.   Patient is reporting slight improvement to pain this morning and rates her pain as 6/10.  She denies any headache, chest pain, shortness of breath, urinary symptoms, nausea, vomiting or diarrhea.   Objective:  Vital signs in last 24 hours:  Vitals:   11/05/24 0007 11/05/24 0438 11/05/24 0529 11/05/24 0934  BP:  131/89    Pulse:  84    Resp: 16 16 19 14   Temp:  98.8 F (37.1 C)    TempSrc:  Oral    SpO2:  100%    Weight:      Height:        Intake/Output from previous day:   Intake/Output Summary (Last 24 hours) at 11/05/2024 1025 Last data filed at 11/04/2024 2114 Gross per 24 hour  Intake 360 ml  Output --  Net 360 ml    Physical Exam: General: Alert, awake, oriented x3, in no acute distress.  HEENT: [place]/AT PEERL, EOMI Neck: Trachea midline,  no masses, no thyromegal,y no JVD, no carotid bruit OROPHARYNX:  Moist, No exudate/ erythema/lesions.  Heart: Regular rate and rhythm, without murmurs, rubs, gallops, PMI non-displaced, no heaves or thrills on palpation.  Lungs: Clear to auscultation, no wheezing or rhonchi noted. No increased vocal fremitus resonant to percussion  Abdomen: Soft, nontender, nondistended, positive bowel sounds, no masses no hepatosplenomegaly noted..  Neuro: No focal neurological deficits noted cranial nerves II through XII grossly intact. DTRs 2+ bilaterally upper and lower extremities. Strength 5 out of 5 in bilateral upper and lower extremities. Musculoskeletal: Generalized body tenderness  psychiatric: Patient alert and oriented x3, good insight and cognition, good recent to remote  recall. Lymph node survey: No cervical axillary or inguinal lymphadenopathy noted.  Lab Results:  Basic Metabolic Panel:    Component Value Date/Time   NA 138 11/04/2024 1023   NA 138 08/28/2024 1127   K 3.9 11/04/2024 1023   CL 101 11/04/2024 1023   CO2 27 11/04/2024 1023   BUN 11 11/04/2024 1023   BUN 10 08/28/2024 1127   CREATININE 0.53 11/04/2024 1023   GLUCOSE 99 11/04/2024 1023   CALCIUM  9.7 11/04/2024 1023   CBC:    Component Value Date/Time   WBC 11.8 (H) 11/04/2024 1023   HGB 8.9 (L) 11/04/2024 1023   HGB 9.7 (L) 08/28/2024 1127   HCT 24.5 (L) 11/04/2024 1023   HCT 30.2 (L) 08/28/2024 1127   PLT 395 11/04/2024 1023   PLT 526 (H) 08/28/2024 1127   MCV 83.9 11/04/2024 1023   MCV 92 08/28/2024 1127   NEUTROABS 7.0 11/03/2024 0535   NEUTROABS 4.0 08/28/2024 1127   LYMPHSABS 2.1 11/03/2024 0535   LYMPHSABS 3.0 08/28/2024 1127   MONOABS 0.9 11/03/2024 0535   EOSABS 0.1 11/03/2024 0535   EOSABS 0.1 08/28/2024 1127   BASOSABS 0.0 11/03/2024 0535   BASOSABS 0.0 08/28/2024 1127    No results found for this or any previous visit (from the past 240 hours).  Studies/Results: No results found.  Medications: Scheduled Meds:  enoxaparin  (LOVENOX ) injection  40 mg Subcutaneous Q24H   FLUoxetine   40 mg Oral Daily   folic acid   1 mg Oral Daily  gabapentin   100 mg Oral TID   HYDROmorphone    Intravenous Q4H   ketorolac   15 mg Intravenous Q6H   morphine   60 mg Oral Q12H   senna-docusate  1 tablet Oral BID   Continuous Infusions: PRN Meds:.cyclobenzaprine , diphenhydrAMINE , melatonin, naloxone  **AND** sodium chloride  flush, ondansetron  (ZOFRAN ) IV, mouth rinse, oxyCODONE , polyethylene glycol  Consultants: None  Procedures: None  Antibiotics: None  Assessment/Plan: Principal Problem:   Sickle cell anemia with crisis (HCC) Active Problems:   Anemia of chronic disease   Chronic musculoskeletal pain   Major depressive disorder   Chronic pain syndrome   Hb  Sickle Cell Disease with Pain crisis: Pain is gradually improving, continue IVF 0.45% Saline @KVO , continue weight based Dilaudid  PCA, Toradol  15 mg IV every 6 hours as needed for 5 days.  Monitor vitals very closely, Re-evaluate pain scale regularly, 2 L of Oxygen  by [place].   Anemia of Chronic Disease: Hemoglobin within patient's baseline.  No clinical indication for transfusion.  Will continue to monitor.  Daily CBC in place.   Chronic pain Syndrome: Continue oral home pain medication.   Major depressive disorder-stable denies suicidal ideation.  Continue home medication as prescribed.  Follow-up as scheduled.  Code Status: Full Code Family Communication: N/A Disposition Plan: Not yet ready for discharge  Carmen [name] Cover NP  If 7PM-7AM, please contact night-coverage.  11/05/2024, 10:25 AM  LOS: 2 days

## 2024-11-05 NOTE — TOC Initial Note (Signed)
 Transition of Care [facility]) - Initial/Assessment Note    Patient Details  Name: Carmen [name] MRN: [id number] Date of Birth: 11/14/1990  Transition of Care [facility]) CM/SW Contact:    Toy LITTIE Agar, RN Phone Number:[phone]  11/05/2024, 3:56 PM  Clinical Narrative:                 Inpatient care manager following patient with high risk for readmission. Patient is from home where she functions independently. PCP is  Paseda, Folashade R, FNP.NO HH or DME needs. Inpatient care manager following for disposition needs.   Expected Discharge Plan: Home/Self Care Barriers to Discharge: No Barriers Identified, Continued Medical Work up   Patient Goals and CMS Choice Patient states their goals for this hospitalization and ongoing recovery are:: Wants pain to get better in order to discharge home   Choice offered to / list presented to : NA [place] ownership interest in [facility].provided to::  (n/a)    Expected Discharge Plan and Services In-house Referral: NA Discharge Planning Services: CM Consult Post Acute Care Choice: NA Living arrangements for the past 2 months: Apartment                 DME Arranged: N/A DME Agency: NA       HH Arranged: NA HH Agency: NA        Prior Living Arrangements/Services Living arrangements for the past 2 months: Apartment Lives with:: Self Patient language and need for interpreter reviewed:: Yes Do you feel safe going back to the place where you live?: Yes      Need for Family Participation in Patient Care: No (Comment) Care giver support system in place?: Yes (comment) Current home services:  (n/a) Criminal Activity/Legal Involvement Pertinent to Current Situation/Hospitalization: No - Comment as needed  Activities of Daily Living   ADL Screening (condition at time of admission) Independently performs ADLs?: Yes (appropriate for developmental age) Is the patient deaf or have difficulty hearing?: No Does the patient have  difficulty seeing, even when wearing glasses/contacts?: No Does the patient have difficulty concentrating, remembering, or making decisions?: No  Permission Sought/Granted Permission sought to share information with : Family Supports Permission granted to share information with : No, Yes, Verbal Permission Granted  Share Information with NAME: Denisa Johndrow     Permission granted to share info w Relationship: sister  Permission granted to share info w Contact Information: [phone]  Emotional Assessment Appearance:: Appears stated age Attitude/Demeanor/Rapport: Gracious Affect (typically observed): Pleasant Orientation: : Oriented to Self, Oriented to  Time, Oriented to Place, Oriented to Situation Alcohol / Substance Use: Not Applicable Psych Involvement: No (comment)  Admission diagnosis:  Sickle cell anemia with crisis (HCC) [D57.00] Sickle cell pain crisis (HCC) [D57.00] Patient Active Problem List   Diagnosis Date Noted   Need for influenza vaccination 08/28/2024   GAD (generalized anxiety disorder) 08/28/2024   Obesity (BMI 30-39.9) 05/28/2024   Annual physical exam 05/28/2024   Encounter for routine dental examination 05/28/2024   Vasoocclusive sickle cell crisis (HCC) 01/29/2024   Marijuana smoker 12/27/2023   Encounter for contraceptive management 12/27/2023   Hospital discharge follow-up 03/09/2023   Nausea & vomiting 03/03/2022   Acute chest syndrome due to sickle cell crisis (HCC) 09/05/2021   Chronic pain syndrome 08/21/2021   Severe major depression without psychotic features (HCC) 05/28/2021   Major depressive disorder 05/22/2021   Tobacco use disorder, mild, abuse 05/21/2021   Sickle cell disease with crisis (HCC) 03/23/2021   Acute  pain of right shoulder    Sickle cell anemia with crisis (HCC) 02/16/2021   Community acquired pneumonia of right lower lobe of lung    Fever of unknown origin 10/14/2020   Sickle cell crisis (HCC) 03/31/2020   Bacterial  vaginitis 01/04/2020   Acute cystitis without hematuria    Dysuria 12/31/2019   Urine leukocytes    Sickle cell pain crisis (HCC) 09/10/2019   Sickle cell disease (HCC) 12/04/2018   Alpha thalassemia silent carrier 12/04/2018   Chronic prescription opiate use 03/13/2018   Chronic musculoskeletal pain 03/13/2018   Vitamin D  deficiency 01/13/2018   Leukocytosis 08/21/2017   Hypokalemia 06/27/2017   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[facility]) 04/05/2017   Constipation due to pain medication    Hb-SS disease without crisis (HCC) 08/15/2016   Anemia of chronic disease    Chest pain 11/10/2015   Sickle cell anemia with pain (HCC) 02/16/2015   Thrombocytosis 11/22/2014   Systolic ejection murmur 09/11/2014   Sickle cell anemia of mother during pregnancy (HCC) 05/16/2014   PCP:  Paseda, Folashade R, FNP Pharmacy:   [id number] [place], [place] - [address] AT [facility] [address] [place] [address] Phone: [phone] Fax: [phone]  [facility] [place], [place] - [address] [place] [address] Phone: [phone] Fax: [phone]  [id number] [address] [place] - [address] [place] [address] Phone: [phone] Fax: [phone]  [id number] [place], [place] - [address] AT CORNER OF [address] [place] [address] Phone: [phone] Fax: [phone]     Social Drivers of Health (SDOH) Social History: SDOH Screenings   Food Insecurity: No Food Insecurity (11/03/2024)  Recent Concern: Food Insecurity - Food Insecurity Present (09/01/2024)  Housing: High Risk (11/03/2024)  Transportation Needs: No Transportation Needs (11/03/2024)  Recent Concern: Transportation Needs - Unmet Transportation Needs (09/01/2024)  Utilities: Not At Risk (11/03/2024)  Alcohol Screen: Low  Risk (08/28/2024)  Depression (PHQ2-9): High Risk (09/12/2024)  Financial Resource Strain: Low Risk (08/28/2024)  Physical Activity: Sufficiently Active (08/28/2024)  Social Connections: Socially Isolated (08/28/2024)  Stress: Stress Concern Present (08/28/2024)  Tobacco Use: High Risk (11/03/2024)   SDOH Interventions:     Readmission Risk Interventions    11/05/2024    3:43 PM 10/04/2024    3:59 PM 08/02/2024    3:35 PM  Readmission Risk Prevention Plan  Transportation Screening Complete Complete Complete  PCP or Specialist Appt within 3-5 Days Complete  Complete  HRI or Home Care Consult Complete  Complete  Social Work Consult for Recovery Care Planning/Counseling Complete  Complete  Palliative Care Screening Not Applicable  Not Applicable  Medication Review [employer]) Complete Complete Complete  HRI or Home Care Consult  Complete   SW Recovery Care/Counseling Consult  Complete   Palliative Care Screening  Not Applicable   Skilled Nursing Facility  Not Applicable

## 2024-11-05 NOTE — Plan of Care (Signed)
   Problem: Education: Goal: Knowledge of vaso-occlusive preventative measures will improve Outcome: Progressing Goal: Awareness of infection prevention will improve Outcome: Progressing Goal: Awareness of signs and symptoms of anemia will improve Outcome: Progressing Goal: Long-term complications will improve Outcome: Progressing

## 2024-11-06 LAB — CBC
HCT: 23 % — ABNORMAL LOW (ref 36.0–46.0)
Hemoglobin: 8.6 g/dL — ABNORMAL LOW (ref 12.0–15.0)
MCH: 30.6 pg (ref 26.0–34.0)
MCHC: 37.4 g/dL — ABNORMAL HIGH (ref 30.0–36.0)
MCV: 81.9 fL (ref 80.0–100.0)
Platelets: 399 K/uL (ref 150–400)
RBC: 2.81 MIL/uL — ABNORMAL LOW (ref 3.87–5.11)
RDW: 16.3 % — ABNORMAL HIGH (ref 11.5–15.5)
WBC: 16.2 K/uL — ABNORMAL HIGH (ref 4.0–10.5)
nRBC: 0.8 % — ABNORMAL HIGH (ref 0.0–0.2)

## 2024-11-06 MED ORDER — ACETAMINOPHEN 325 MG PO TABS
650.0000 mg | ORAL_TABLET | Freq: Four times a day (QID) | ORAL | Status: DC | PRN
  Administered 2024-11-06: 650 mg via ORAL
  Filled 2024-11-06: qty 2

## 2024-11-06 MED ORDER — ACETAMINOPHEN 650 MG RE SUPP: 650.0000 mg | Freq: Four times a day (QID) | RECTAL | Status: DC | PRN

## 2024-11-06 NOTE — Plan of Care (Signed)

## 2024-11-06 NOTE — Plan of Care (Signed)
" °  Problem: Self-Care: Goal: Ability to incorporate actions that prevent/reduce pain crisis will improve Outcome: Progressing   Problem: Sensory: Goal: Pain level will decrease with appropriate interventions Outcome: Progressing   Problem: Health Behavior: Goal: Postive changes in compliance with treatment and prescription regimens will improve Outcome: Progressing   "

## 2024-11-06 NOTE — Progress Notes (Addendum)
 Patient ID: Carmen [name], female   DOB: 1991-09-03, 34 y.o.   MRN: [id number] Subjective: Carmen [name] is a 34 year old female with a medical history significant for sickle cell disease, chronic pain syndrome, opiate dependence and tolerance, history of anemia of chronic disease, and history of major depressive disorder was admitted for sickle cell pain crisis.   Patient endorsing pain of 7/10.  He is reporting new right lateral chest pain, mild cough and fever.  Completed chest x-ray results pending, respiratory panel completed results pending.  She denies any headache, shortness of breath, no urinary symptoms, nausea, vomiting or diarrhea.   Objective:  Vital signs in last 24 hours:  Vitals:   11/07/24 0645 11/07/24 0757 11/07/24 1010 11/07/24 1011  BP: 118/70  126/75   Pulse: 82  97   Resp: 18 12  12   Temp: 98.9 F (37.2 C)  99.3 F (37.4 C)   TempSrc: Oral  Oral   SpO2: 97%  95%   Weight:      Height:        Intake/Output from previous day:   Intake/Output Summary (Last 24 hours) at 11/07/2024 1154 Last data filed at 11/07/2024 1100 Gross per 24 hour  Intake 1015.1 ml  Output --  Net 1015.1 ml    Physical Exam: General: Alert, awake, oriented x3, in no acute distress.  HEENT: [place]/AT PEERL, EOMI Neck: Trachea midline,  no masses, no thyromegal,y no JVD, no carotid bruit OROPHARYNX:  Moist, No exudate/ erythema/lesions.  Heart: Regular rate and rhythm, without murmurs, rubs, gallops, PMI non-displaced, no heaves or thrills on palpation.  Lungs: Clear to auscultation, no wheezing or rhonchi noted. No increased vocal fremitus resonant to percussion  Abdomen: Soft, nontender, nondistended, positive bowel sounds, no masses no hepatosplenomegaly noted..  Neuro: No focal neurological deficits noted cranial nerves II through XII grossly intact. DTRs 2+ bilaterally upper and lower extremities. Strength 5 out of 5 in bilateral upper and lower extremities. Musculoskeletal: Generalized  body tenderness  psychiatric: Patient alert and oriented x3, good insight and cognition, good recent to remote recall. Lymph node survey: No cervical axillary or inguinal lymphadenopathy noted.  Lab Results:  Basic Metabolic Panel:    Component Value Date/Time   NA 136 11/07/2024 0635   NA 138 08/28/2024 1127   K 4.4 11/07/2024 0635   CL 98 11/07/2024 0635   CO2 28 11/07/2024 0635   BUN 17 11/07/2024 0635   BUN 10 08/28/2024 1127   CREATININE 0.56 11/07/2024 0635   GLUCOSE 89 11/07/2024 0635   CALCIUM  9.8 11/07/2024 0635   CBC:    Component Value Date/Time   WBC 13.5 (H) 11/07/2024 0635   HGB 8.4 (L) 11/07/2024 0635   HGB 9.7 (L) 08/28/2024 1127   HCT 23.3 (L) 11/07/2024 0635   HCT 30.2 (L) 08/28/2024 1127   PLT 450 (H) 11/07/2024 0635   PLT 526 (H) 08/28/2024 1127   MCV 84.1 11/07/2024 0635   MCV 92 08/28/2024 1127   NEUTROABS 7.0 11/03/2024 0535   NEUTROABS 4.0 08/28/2024 1127   LYMPHSABS 2.1 11/03/2024 0535   LYMPHSABS 3.0 08/28/2024 1127   MONOABS 0.9 11/03/2024 0535   EOSABS 0.1 11/03/2024 0535   EOSABS 0.1 08/28/2024 1127   BASOSABS 0.0 11/03/2024 0535   BASOSABS 0.0 08/28/2024 1127    No results found for this or any previous visit (from the past 240 hours).  Studies/Results: No results found.  Medications: Scheduled Meds:  enoxaparin  (LOVENOX ) injection  40 mg Subcutaneous Q24H  FLUoxetine   40 mg Oral Daily   folic acid   1 mg Oral Daily   gabapentin   100 mg Oral TID   HYDROmorphone    Intravenous Q4H   ketorolac   15 mg Intravenous Q6H   morphine   60 mg Oral Q12H   senna-docusate  1 tablet Oral BID   Continuous Infusions: PRN Meds:.acetaminophen  **OR** acetaminophen , cyclobenzaprine , diphenhydrAMINE , naloxone  **AND** sodium chloride  flush, ondansetron  (ZOFRAN ) IV, mouth rinse, oxyCODONE , polyethylene glycol  Consultants: None  Procedures: Chest x-ray  Antibiotics: None  Assessment/Plan: Principal Problem:   Sickle cell anemia with crisis  (HCC) Active Problems:   Anemia of chronic disease   Chronic musculoskeletal pain   Major depressive disorder   Chronic pain syndrome   Hb Sickle Cell Disease with Pain crisis: No changes to pain symptoms from yesterday, continue IVF 0.45% Saline @KVO , continue weight based Dilaudid  PCA on current dose, Toradol  15 mg IV every 6 hours as needed for 5 days.  Monitor vitals very closely, Re-evaluate pain scale regularly, 2 L of Oxygen  by [place].   Anemia of Chronic Disease: Hemoglobin within patient's baseline.  No clinical indication for transfusion.  Will continue to monitor.  Daily CBC in place.   Chronic pain Syndrome: Continue oral home pain medication.   Major depressive disorder-stable denies suicidal ideation.  Continue home medication as prescribed.  Follow-up as scheduled.  Code Status: Full Code Family Communication: N/A Disposition Plan: Not yet ready for discharge  [name] Cover NP  If 7PM-7AM, please contact night-coverage.  11/07/2024, 11:54 AM  LOS: 4 days

## 2024-11-07 ENCOUNTER — Inpatient Hospital Stay (HOSPITAL_COMMUNITY): Payer: MEDICAID

## 2024-11-07 LAB — RESPIRATORY PANEL BY PCR

## 2024-11-07 LAB — CBC
HCT: 23.3 % — ABNORMAL LOW (ref 36.0–46.0)
Hemoglobin: 8.4 g/dL — ABNORMAL LOW (ref 12.0–15.0)
MCH: 30.3 pg (ref 26.0–34.0)
MCHC: 36.1 g/dL — ABNORMAL HIGH (ref 30.0–36.0)
MCV: 84.1 fL (ref 80.0–100.0)
Platelets: 450 K/uL — ABNORMAL HIGH (ref 150–400)
RBC: 2.77 MIL/uL — ABNORMAL LOW (ref 3.87–5.11)
RDW: 15.6 % — ABNORMAL HIGH (ref 11.5–15.5)
WBC: 13.5 K/uL — ABNORMAL HIGH (ref 4.0–10.5)
nRBC: 0.7 % — ABNORMAL HIGH (ref 0.0–0.2)

## 2024-11-07 LAB — COMPREHENSIVE METABOLIC PANEL WITH GFR
ALT: 19 U/L (ref 0–44)
AST: 29 U/L (ref 15–41)
Albumin: 4 g/dL (ref 3.5–5.0)
Alkaline Phosphatase: 90 U/L (ref 38–126)
Anion gap: 10 (ref 5–15)
BUN: 17 mg/dL (ref 6–20)
CO2: 28 mmol/L (ref 22–32)
Calcium: 9.8 mg/dL (ref 8.9–10.3)
Chloride: 98 mmol/L (ref 98–111)
Creatinine, Ser: 0.56 mg/dL (ref 0.44–1.00)
GFR, Estimated: >60 mL/min
Glucose, Bld: 89 mg/dL (ref 70–99)
Potassium: 4.4 mmol/L (ref 3.5–5.1)
Sodium: 136 mmol/L (ref 135–145)
Total Bilirubin: 2.2 mg/dL — ABNORMAL HIGH (ref 0.0–1.2)
Total Protein: 7.6 g/dL (ref 6.5–8.1)

## 2024-11-07 LAB — RETICULOCYTES
Immature Retic Fract: 40.1 % — ABNORMAL HIGH (ref 2.3–15.9)
RBC.: 2.72 MIL/uL — ABNORMAL LOW (ref 3.87–5.11)
Retic Count, Absolute: 276.6 K/uL — ABNORMAL HIGH (ref 19.0–186.0)
Retic Ct Pct: 10.2 % — ABNORMAL HIGH (ref 0.4–3.1)

## 2024-11-07 LAB — LACTATE DEHYDROGENASE: LDH: 397 U/L — ABNORMAL HIGH (ref 105–235)

## 2024-11-07 MED ORDER — HYDROMORPHONE HCL 2 MG/ML IJ SOLN
2.0000 mg | Freq: Once | INTRAMUSCULAR | Status: AC
  Administered 2024-11-07: 2 mg via INTRAVENOUS
  Filled 2024-11-07: qty 1

## 2024-11-07 MED ORDER — HYDROXYZINE HCL 25 MG PO TABS
25.0000 mg | ORAL_TABLET | Freq: Once | ORAL | Status: AC
  Administered 2024-11-07: 25 mg via ORAL
  Filled 2024-11-07: qty 1

## 2024-11-07 NOTE — Progress Notes (Signed)
 Patient ID: Carmen [name], female   DOB: [birth year], 34 y.o.   MRN: [id number] Subjective: Carmen [name] is a 34 year old female with a medical history significant for sickle cell disease, chronic pain syndrome, opiate dependence and tolerance, history of anemia of chronic disease, and history of major depressive disorder was admitted for sickle cell pain crisis.   Patient is endorsing pain this morning at 6/10.  She states that her pain has not improved from yesterday because she forgot to ask for her PRN pain medications but she is doing that today.  She denies any headache, chest pain, shortness of breath, urinary symptoms, nausea, vomiting or diarrhea.   Objective:  Vital signs in last 24 hours:  Vitals:   11/07/24 0241 11/07/24 0417 11/07/24 0645 11/07/24 0757  BP: 111/75  118/70   Pulse: 91  82   Resp: 18 14 18 12   Temp: 97.9 F (36.6 C)  98.9 F (37.2 C)   TempSrc: Oral  Oral   SpO2: 96%  97%   Weight:      Height:        Intake/Output from previous day:   Intake/Output Summary (Last 24 hours) at 11/07/2024 0951 Last data filed at 11/07/2024 0600 Gross per 24 hour  Intake 1015.1 ml  Output --  Net 1015.1 ml    Physical Exam: General: Alert, awake, oriented x3, in no acute distress.  HEENT: [place]/AT PEERL, EOMI Neck: Trachea midline,  no masses, no thyromegal,y no JVD, no carotid bruit OROPHARYNX:  Moist, No exudate/ erythema/lesions.  Heart: Regular rate and rhythm, without murmurs, rubs, gallops, PMI non-displaced, no heaves or thrills on palpation.  Lungs: Clear to auscultation, no wheezing or rhonchi noted. No increased vocal fremitus resonant to percussion  Abdomen: Soft, nontender, nondistended, positive bowel sounds, no masses no hepatosplenomegaly noted..  Neuro: No focal neurological deficits noted cranial nerves II through XII grossly intact. DTRs 2+ bilaterally upper and lower extremities. Strength 5 out of 5 in bilateral upper and lower  extremities. Musculoskeletal: Generalized body tenderness  psychiatric: Patient alert and oriented x3, good insight and cognition, good recent to remote recall. Lymph node survey: No cervical axillary or inguinal lymphadenopathy noted.  Lab Results:  Basic Metabolic Panel:    Component Value Date/Time   NA 138 11/04/2024 1023   NA 138 08/28/2024 1127   K 3.9 11/04/2024 1023   CL 101 11/04/2024 1023   CO2 27 11/04/2024 1023   BUN 11 11/04/2024 1023   BUN 10 08/28/2024 1127   CREATININE 0.53 11/04/2024 1023   GLUCOSE 99 11/04/2024 1023   CALCIUM  9.7 11/04/2024 1023   CBC:    Component Value Date/Time   WBC 13.5 (H) 11/07/2024 0635   HGB 8.4 (L) 11/07/2024 0635   HGB 9.7 (L) 08/28/2024 1127   HCT 23.3 (L) 11/07/2024 0635   HCT 30.2 (L) 08/28/2024 1127   PLT 450 (H) 11/07/2024 0635   PLT 526 (H) 08/28/2024 1127   MCV 84.1 11/07/2024 0635   MCV 92 08/28/2024 1127   NEUTROABS 7.0 11/03/2024 0535   NEUTROABS 4.0 08/28/2024 1127   LYMPHSABS 2.1 11/03/2024 0535   LYMPHSABS 3.0 08/28/2024 1127   MONOABS 0.9 11/03/2024 0535   EOSABS 0.1 11/03/2024 0535   EOSABS 0.1 08/28/2024 1127   BASOSABS 0.0 11/03/2024 0535   BASOSABS 0.0 08/28/2024 1127    No results found for this or any previous visit (from the past 240 hours).  Studies/Results: No results found.  Medications: Scheduled Meds:  enoxaparin  (LOVENOX )  injection  40 mg Subcutaneous Q24H   FLUoxetine   40 mg Oral Daily   folic acid   1 mg Oral Daily   gabapentin   100 mg Oral TID   HYDROmorphone    Intravenous Q4H   ketorolac   15 mg Intravenous Q6H   morphine   60 mg Oral Q12H   senna-docusate  1 tablet Oral BID   Continuous Infusions: PRN Meds:.acetaminophen  **OR** acetaminophen , cyclobenzaprine , diphenhydrAMINE , naloxone  **AND** sodium chloride  flush, ondansetron  (ZOFRAN ) IV, mouth rinse, oxyCODONE , polyethylene glycol  Consultants: None  Procedures: None  Antibiotics: None  Assessment/Plan: Principal  Problem:   Sickle cell anemia with crisis (HCC) Active Problems:   Anemia of chronic disease   Chronic musculoskeletal pain   Major depressive disorder   Chronic pain syndrome   Hb Sickle Cell Disease with Pain crisis: Pain is the same from yesterday, continue IVF 0.45% Saline @KVO , continue weight based Dilaudid  PCA on current dose, Toradol  15 mg IV every 6 hours as needed for 5 days.  Monitor vitals very closely, Re-evaluate pain scale regularly, 2 L of Oxygen  by .   Anemia of Chronic Disease: Hemoglobin within patient's baseline.  No clinical indication for transfusion.  Will continue to monitor.  Daily CBC in place.   Chronic pain Syndrome: Continue oral home pain medication.   Major depressive disorder-stable denies suicidal ideation.  Continue home medication as prescribed.  Follow-up as scheduled.  Code Status: Full Code Family Communication: N/A Disposition Plan: Not yet ready for discharge  [name] Cover NP  If 7PM-7AM, please contact night-coverage.  11/07/2024, 9:51 AM  LOS: 4 days

## 2024-11-07 NOTE — Plan of Care (Signed)

## 2024-11-07 NOTE — Plan of Care (Signed)
  Problem: Education: Goal: Knowledge of vaso-occlusive preventative measures will improve Outcome: Progressing   Problem: Self-Care: Goal: Ability to incorporate actions that prevent/reduce pain crisis will improve Outcome: Progressing   Problem: Respiratory: Goal: Pulmonary complications will be avoided or minimized Outcome: Progressing   Problem: Sensory: Goal: Pain level will decrease with appropriate interventions Outcome: Progressing   Problem: Health Behavior: Goal: Postive changes in compliance with treatment and prescription regimens will improve Outcome: Progressing

## 2024-11-08 ENCOUNTER — Telehealth: Payer: Self-pay

## 2024-11-08 LAB — CBC
HCT: 23.5 % — ABNORMAL LOW (ref 36.0–46.0)
Hemoglobin: 8.4 g/dL — ABNORMAL LOW (ref 12.0–15.0)
MCH: 30.2 pg (ref 26.0–34.0)
MCHC: 35.7 g/dL (ref 30.0–36.0)
MCV: 84.5 fL (ref 80.0–100.0)
Platelets: 478 K/uL — ABNORMAL HIGH (ref 150–400)
RBC: 2.78 MIL/uL — ABNORMAL LOW (ref 3.87–5.11)
RDW: 14.6 % (ref 11.5–15.5)
WBC: 10.7 K/uL — ABNORMAL HIGH (ref 4.0–10.5)
nRBC: 0.7 % — ABNORMAL HIGH (ref 0.0–0.2)

## 2024-11-08 NOTE — Plan of Care (Signed)
" °  Problem: Education: Goal: Knowledge of vaso-occlusive preventative measures will improve Outcome: Progressing Goal: Long-term complications will improve Outcome: Progressing   Problem: Self-Care: Goal: Ability to incorporate actions that prevent/reduce pain crisis will improve Outcome: Progressing   Problem: Respiratory: Goal: Pulmonary complications will be avoided or minimized Outcome: Progressing   Problem: Sensory: Goal: Pain level will decrease with appropriate interventions Outcome: Progressing   "

## 2024-11-08 NOTE — Progress Notes (Addendum)
 Patient ID: Carmen [name], female   DOB: 02/15/1991, 34 y.o.   MRN: [id number] Subjective: Carmen [name] is a 34 year old female with a medical history significant for sickle cell disease, chronic pain syndrome, opiate dependence and tolerance, history of anemia of chronic disease, and history of major depressive disorder was admitted for sickle cell pain crisis.   Patient is endorsing improved pain of 6/10. She denies any headache, chest pain, shortness of breath, urinary symptoms, nausea, vomiting or diarrhea. Respiratory panel negative for flu or Covid-19, Chest X-ray negative for any acute abnormality.    Objective:  Vital signs in last 24 hours:  Vitals:   11/08/24 0421 11/08/24 0820 11/08/24 0935 11/08/24 1050  BP: 115/84  123/85   Pulse: 84  83   Resp: 20 18 16 18   Temp: 98.2 F (36.8 C)  98.6 F (37 C)   TempSrc:   Oral   SpO2: 100%  100%   Weight:      Height:        Intake/Output from previous day:   Intake/Output Summary (Last 24 hours) at 11/08/2024 1057 Last data filed at 11/08/2024 0618 Gross per 24 hour  Intake 1255 ml  Output --  Net 1255 ml    Physical Exam: General: Alert, awake, oriented x3, in no acute distress.  HEENT: [place]/AT PEERL, EOMI Neck: Trachea midline,  no masses, no thyromegal,y no JVD, no carotid bruit OROPHARYNX:  Moist, No exudate/ erythema/lesions.  Heart: Regular rate and rhythm, without murmurs, rubs, gallops, PMI non-displaced, no heaves or thrills on palpation.  Lungs: Clear to auscultation, no wheezing or rhonchi noted. No increased vocal fremitus resonant to percussion  Abdomen: Soft, nontender, nondistended, positive bowel sounds, no masses no hepatosplenomegaly noted..  Neuro: No focal neurological deficits noted cranial nerves II through XII grossly intact. DTRs 2+ bilaterally upper and lower extremities. Strength 5 out of 5 in bilateral upper and lower extremities. Musculoskeletal: Generalized body tenderness  psychiatric: Patient  alert and oriented x3, good insight and cognition, good recent to remote recall. Lymph node survey: No cervical axillary or inguinal lymphadenopathy noted.  Lab Results:  Basic Metabolic Panel:    Component Value Date/Time   NA 136 11/07/2024 0635   NA 138 08/28/2024 1127   K 4.4 11/07/2024 0635   CL 98 11/07/2024 0635   CO2 28 11/07/2024 0635   BUN 17 11/07/2024 0635   BUN 10 08/28/2024 1127   CREATININE 0.56 11/07/2024 0635   GLUCOSE 89 11/07/2024 0635   CALCIUM  9.8 11/07/2024 0635   CBC:    Component Value Date/Time   WBC 10.7 (H) 11/08/2024 0719   HGB 8.4 (L) 11/08/2024 0719   HGB 9.7 (L) 08/28/2024 1127   HCT 23.5 (L) 11/08/2024 0719   HCT 30.2 (L) 08/28/2024 1127   PLT 478 (H) 11/08/2024 0719   PLT 526 (H) 08/28/2024 1127   MCV 84.5 11/08/2024 0719   MCV 92 08/28/2024 1127   NEUTROABS 7.0 11/03/2024 0535   NEUTROABS 4.0 08/28/2024 1127   LYMPHSABS 2.1 11/03/2024 0535   LYMPHSABS 3.0 08/28/2024 1127   MONOABS 0.9 11/03/2024 0535   EOSABS 0.1 11/03/2024 0535   EOSABS 0.1 08/28/2024 1127   BASOSABS 0.0 11/03/2024 0535   BASOSABS 0.0 08/28/2024 1127    Recent Results (from the past 240 hours)  Respiratory (~20 pathogens) panel by PCR     Status: None   Collection Time: 11/07/24  1:24 PM   Specimen: Nasopharyngeal Swab; Respiratory  Result Value Ref Range Status  Adenovirus NOT DETECTED NOT DETECTED Final   Coronavirus 229E NOT DETECTED NOT DETECTED Final    Comment: (NOTE) The Coronavirus on the Respiratory Panel, DOES NOT test for the novel  Coronavirus (2019 nCoV)    Coronavirus HKU1 NOT DETECTED NOT DETECTED Final   Coronavirus NL63 NOT DETECTED NOT DETECTED Final   Coronavirus OC43 NOT DETECTED NOT DETECTED Final   Metapneumovirus NOT DETECTED NOT DETECTED Final   Rhinovirus / Enterovirus NOT DETECTED NOT DETECTED Final   Influenza A NOT DETECTED NOT DETECTED Final   Influenza B NOT DETECTED NOT DETECTED Final   Parainfluenza Virus 1 NOT DETECTED NOT  DETECTED Final   Parainfluenza Virus 2 NOT DETECTED NOT DETECTED Final   Parainfluenza Virus 3 NOT DETECTED NOT DETECTED Final   Parainfluenza Virus 4 NOT DETECTED NOT DETECTED Final   Respiratory Syncytial Virus NOT DETECTED NOT DETECTED Final   Bordetella pertussis NOT DETECTED NOT DETECTED Final   Bordetella Parapertussis NOT DETECTED NOT DETECTED Final   Chlamydophila pneumoniae NOT DETECTED NOT DETECTED Final   Mycoplasma pneumoniae NOT DETECTED NOT DETECTED Final    Comment: Performed at [facility], [address] [place], [place] [address]    Studies/Results: [place] CHEST PORT 1 VIEW Result Date: 11/07/2024 CLINICAL DATA:  Chest pain.  Sickle cell crisis. EXAM: PORTABLE CHEST 1 VIEW COMPARISON:  09/06/2024 FINDINGS: The heart size and mediastinal contours are within normal limits. Both lungs are clear. The visualized skeletal structures are unremarkable. IMPRESSION: Stable normal examination. Electronically Signed   By: Elspeth Bathe M.D.   On: 11/07/2024 12:17    Medications: Scheduled Meds:  enoxaparin  (LOVENOX ) injection  40 mg Subcutaneous Q24H   FLUoxetine   40 mg Oral Daily   folic acid   1 mg Oral Daily   gabapentin   100 mg Oral TID   HYDROmorphone    Intravenous Q4H   morphine   60 mg Oral Q12H   senna-docusate  1 tablet Oral BID   Continuous Infusions: PRN Meds:.acetaminophen  **OR** acetaminophen , cyclobenzaprine , diphenhydrAMINE , naloxone  **AND** sodium chloride  flush, ondansetron  (ZOFRAN ) IV, mouth rinse, oxyCODONE , polyethylene glycol  Consultants: None  Procedures: None  Antibiotics: None  Assessment/Plan: Principal Problem:   Sickle cell anemia with crisis (HCC) Active Problems:   Anemia of chronic disease   Chronic musculoskeletal pain   Major depressive disorder   Chronic pain syndrome   Hb Sickle Cell Disease with Pain crisis: Pain is gradually improving. Continue IVF 0.45% Saline @KVO , continue weight based Dilaudid  PCA on current dose, Toradol   15 mg IV every 6 hours as needed for 5 days (completed). Monitor vitals very closely, Re-evaluate pain scale regularly, 2 L of Oxygen  by [place].   Anemia of Chronic Disease: Hemoglobin within patient's baseline.  No clinical indication for transfusion.  Will continue to monitor.  Daily CBC in place.   Chronic pain Syndrome: Continue oral home pain medication.   Major depressive disorder-stable denies suicidal ideation.  Continue home medication as prescribed.  Follow-up as scheduled.  Code Status: Full Code Family Communication: N/A Disposition Plan: Not yet ready for discharge  [name] Cover NP  If 7PM-7AM, please contact night-coverage.  11/08/2024, 10:58 AM  LOS: 5 days

## 2024-11-08 NOTE — Plan of Care (Signed)

## 2024-11-08 NOTE — Telephone Encounter (Signed)
 Pt is requesting pain med . Please advise . KH

## 2024-11-09 ENCOUNTER — Telehealth: Payer: Self-pay

## 2024-11-09 ENCOUNTER — Encounter (HOSPITAL_COMMUNITY): Payer: Self-pay

## 2024-11-09 MED ORDER — MORPHINE SULFATE ER 60 MG PO TBCR: 60.0000 mg | EXTENDED_RELEASE_TABLET | Freq: Two times a day (BID) | ORAL | 0 refills | Status: AC

## 2024-11-09 MED ORDER — OXYCODONE HCL 10 MG PO TABS: 10.0000 mg | ORAL_TABLET | Freq: Four times a day (QID) | ORAL | 0 refills | Status: DC | PRN

## 2024-11-09 MED ORDER — HYDROMORPHONE 1 MG/ML IV SOLN
INTRAVENOUS | Status: DC
  Administered 2024-11-09: 0.9 mg via INTRAVENOUS

## 2024-11-09 NOTE — Plan of Care (Addendum)
 Patient alert and oriented X4, all AVS information explained and handed to patient. Patient refill sent to pharmacy, no further questions atm.  Problem: Education: Goal: Knowledge of vaso-occlusive preventative measures will improve Outcome: Completed/Met Goal: Awareness of infection prevention will improve Outcome: Completed/Met Goal: Awareness of signs and symptoms of anemia will improve Outcome: Completed/Met Goal: Long-term complications will improve Outcome: Completed/Met   Problem: Self-Care: Goal: Ability to incorporate actions that prevent/reduce pain crisis will improve Outcome: Completed/Met   Problem: Bowel/Gastric: Goal: Gut motility will be maintained Outcome: Completed/Met   Problem: Tissue Perfusion: Goal: Complications related to inadequate tissue perfusion will be avoided or minimized Outcome: Completed/Met   Problem: Respiratory: Goal: Pulmonary complications will be avoided or minimized Outcome: Completed/Met Goal: Acute Chest Syndrome will be identified early to prevent complications Outcome: Completed/Met   Problem: Fluid Volume: Goal: Ability to maintain a balanced intake and output will improve Outcome: Completed/Met   Problem: Sensory: Goal: Pain level will decrease with appropriate interventions Outcome: Completed/Met   Problem: Health Behavior: Goal: Postive changes in compliance with treatment and prescription regimens will improve Outcome: Completed/Met   Problem: Education: Goal: Knowledge of General Education information will improve Description: Including pain rating scale, medication(s)/side effects and non-pharmacologic comfort measures Outcome: Completed/Met   Problem: Health Behavior/Discharge Planning: Goal: Ability to manage health-related needs will improve Outcome: Completed/Met   Problem: Clinical Measurements: Goal: Ability to maintain clinical measurements within normal limits will improve Outcome: Completed/Met Goal:  Will remain free from infection Outcome: Completed/Met Goal: Diagnostic test results will improve Outcome: Completed/Met Goal: Respiratory complications will improve Outcome: Completed/Met Goal: Cardiovascular complication will be avoided Outcome: Completed/Met   Problem: Activity: Goal: Risk for activity intolerance will decrease Outcome: Completed/Met   Problem: Nutrition: Goal: Adequate nutrition will be maintained Outcome: Completed/Met   Problem: Coping: Goal: Level of anxiety will decrease Outcome: Completed/Met   Problem: Elimination: Goal: Will not experience complications related to bowel motility Outcome: Completed/Met Goal: Will not experience complications related to urinary retention Outcome: Completed/Met   Problem: Pain Managment: Goal: General experience of comfort will improve and/or be controlled Outcome: Completed/Met   Problem: Safety: Goal: Ability to remain free from injury will improve Outcome: Completed/Met   Problem: Skin Integrity: Goal: Risk for impaired skin integrity will decrease Outcome: Completed/Met

## 2024-11-09 NOTE — Telephone Encounter (Signed)
 error

## 2024-11-09 NOTE — Discharge Instructions (Signed)
 SABRA

## 2024-11-09 NOTE — Plan of Care (Signed)
" °  Problem: Education: Goal: Awareness of signs and symptoms of anemia will improve Outcome: Progressing   Problem: Self-Care: Goal: Ability to incorporate actions that prevent/reduce pain crisis will improve Outcome: Progressing   Problem: Fluid Volume: Goal: Ability to maintain a balanced intake and output will improve Outcome: Progressing   Problem: Sensory: Goal: Pain level will decrease with appropriate interventions Outcome: Progressing   Problem: Clinical Measurements: Goal: Ability to maintain clinical measurements within normal limits will improve Outcome: Progressing Goal: Will remain free from infection Outcome: Progressing   Problem: Activity: Goal: Risk for activity intolerance will decrease Outcome: Progressing   "

## 2024-11-09 NOTE — Discharge Summary (Signed)
 Physician Discharge Summary  Carmen [name] FMW:[id number] DOB: 31-Jul-1991 DOA: 11/03/2024  PCP: Paseda, Folashade R, FNP  Admit date: 11/03/2024  Discharge date: 11/09/2024  Discharge Diagnoses:  Principal Problem:   Sickle cell anemia with crisis (HCC) Active Problems:   Anemia of chronic disease   Chronic musculoskeletal pain   Major depressive disorder   Chronic pain syndrome   Discharge Condition: Stable  Disposition:  Pt is discharged home in good condition and is to follow up with Paseda, Folashade R, FNP this week to have labs evaluated. Carmen [name] is instructed to increase activity slowly and balance with rest for the next few days, and use prescribed medication to complete treatment of pain  Diet: Regular Wt Readings from Last 3 Encounters:  11/03/24 77.1 kg  10/01/24 76.7 kg  09/01/24 72.2 kg    History of present illness:  Carmen [name]  is a 33 y.o. female with a medical history significant for sickle cell disease, chronic pain syndrome, opiate dependence and tolerance, anemia of chronic disease, and major depressive disorder presents with complaints of ongoing pain to low back, upper and lower extremities for greater than 5 days.  Patient stated that pain intensity had been increasing over the past 5 days.  She attributed the pain crisis to changes in weather and stressors at home.  She says that she had been attempting to manage her pain crisis at home without success.  She last had MS Contin  and oxycodone  prior to ED visit without very much relief.  She was treated and evaluated at the sickle cell day infusion center on 11/02/2024.  She states that she attempted to manage at home, however her pain intensity worsened.  She rated her pain as 8/10, constant, and throbbing.  She stated that pain intensity had improved since initiating pain medications in the emergency department. Patient denied any headache, chest pain, shortness of breath, urinary symptoms, nausea, vomiting, or  diarrhea.  She has not had any sick contacts or recent travel.   ED course: Temperature 98.3 F, BP 115/76, pulse rate 56, respirations 16, and oxygen  saturation 99% on room air. Complete blood count shows WBCs 10.2, hemoglobin 9.4 g/dL, and platelets 502,999.  Absolute reticulocytes 207.6.  Comprehensive metabolic panel unremarkable.  Patient's pain persisted despite IV Toradol , IV Dilaudid  x 3 doses, and IV fluids.  Patient was admitted for further management of her sickle cell pain crisis.  Hospital Course:  Patient was admitted for sickle cell pain crisis and managed appropriately with IVF, IV Dilaudid  via PCA and IV Toradol , while on admission patient's hemoglobin remained at baseline.  With no clinical indication for transfusion.  Pain gradually improved and at baseline today.  She has no new concerns, she is tolerating p.o. without nausea or vomiting, patient is ambulating without assistance.  She was therefore discharged home today in a hemodynamically stable condition.   Carmen [name] will follow-up with PCP within 1 week of this discharge. Carmen [name] was counseled extensively about nonpharmacologic means of pain management, patient verbalized understanding and was appreciative of  the care received during this admission.   We discussed the need for good hydration, monitoring of hydration status, avoidance of heat, cold, stress, and infection triggers. We discussed the need to be adherent with taking her home medications. Patient was reminded of the need to seek medical attention immediately if any symptom of bleeding, anemia, or infection occurs.  Discharge Exam: Vitals:   11/09/24 1000 11/09/24 1028  BP: 128/66   Pulse: 96  Resp:  18  Temp: 98.6 F (37 C)   SpO2: 100%    Vitals:   11/09/24 0632 11/09/24 0932 11/09/24 1000 11/09/24 1028  BP:   128/66   Pulse:   96   Resp: 14 16  18   Temp:   98.6 F (37 C)   TempSrc:   Oral   SpO2:   100%   Weight:      Height:        General  appearance : Awake, alert, not in any distress. Speech Clear. Not toxic looking HEENT: Atraumatic and Normocephalic, pupils equally reactive to light and accomodation Neck: Supple, no JVD. No cervical lymphadenopathy.  Chest: Good air entry bilaterally, no added sounds  CVS: S1 S2 regular, no murmurs.  Abdomen: Bowel sounds present, Non tender and not distended with no gaurding, rigidity or rebound. Extremities: B/L Lower Ext shows no edema, both legs are warm to touch Neurology: Awake alert, and oriented X 3, CN II-XII intact, Non focal Skin: No Rash  Discharge Instructions  Discharge Instructions     Call MD for:  severe uncontrolled pain   Complete by: As directed    Call MD for:  temperature >100.4   Complete by: As directed    Increase activity slowly   Complete by: As directed         The results of significant diagnostics from this hospitalization (including imaging, microbiology, ancillary and laboratory) are listed below for reference.    Significant Diagnostic Studies: [place] CHEST PORT 1 VIEW Result Date: 11/07/2024 CLINICAL DATA:  Chest pain.  Sickle cell crisis. EXAM: PORTABLE CHEST 1 VIEW COMPARISON:  09/06/2024 FINDINGS: The heart size and mediastinal contours are within normal limits. Both lungs are clear. The visualized skeletal structures are unremarkable. IMPRESSION: Stable normal examination. Electronically Signed   By: Elspeth Bathe M.D.   On: 11/07/2024 12:17    Microbiology: Recent Results (from the past 240 hours)  Respiratory (~20 pathogens) panel by PCR     Status: None   Collection Time: 11/07/24  1:24 PM   Specimen: Nasopharyngeal Swab; Respiratory  Result Value Ref Range Status   Adenovirus NOT DETECTED NOT DETECTED Final   Coronavirus 229E NOT DETECTED NOT DETECTED Final    Comment: (NOTE) The Coronavirus on the Respiratory Panel, DOES NOT test for the novel  Coronavirus (2019 nCoV)    Coronavirus HKU1 NOT DETECTED NOT DETECTED Final   Coronavirus  NL63 NOT DETECTED NOT DETECTED Final   Coronavirus OC43 NOT DETECTED NOT DETECTED Final   Metapneumovirus NOT DETECTED NOT DETECTED Final   Rhinovirus / Enterovirus NOT DETECTED NOT DETECTED Final   Influenza A NOT DETECTED NOT DETECTED Final   Influenza B NOT DETECTED NOT DETECTED Final   Parainfluenza Virus 1 NOT DETECTED NOT DETECTED Final   Parainfluenza Virus 2 NOT DETECTED NOT DETECTED Final   Parainfluenza Virus 3 NOT DETECTED NOT DETECTED Final   Parainfluenza Virus 4 NOT DETECTED NOT DETECTED Final   Respiratory Syncytial Virus NOT DETECTED NOT DETECTED Final   Bordetella pertussis NOT DETECTED NOT DETECTED Final   Bordetella Parapertussis NOT DETECTED NOT DETECTED Final   Chlamydophila pneumoniae NOT DETECTED NOT DETECTED Final   Mycoplasma pneumoniae NOT DETECTED NOT DETECTED Final    Comment: Performed at [facility], [address] [place], [place] [address]     Labs: Basic Metabolic Panel: Recent Labs  Lab 11/03/24 0535 11/04/24 1023 11/07/24 0635  NA 139 138 136  K 3.7 3.9 4.4  CL 103 101 98  CO2 25 27 28   GLUCOSE 106* 99 89  BUN 11 11 17   CREATININE 0.61 0.53 0.56  CALCIUM  9.6 9.7 9.8   Liver Function Tests: Recent Labs  Lab 11/03/24 0535 11/04/24 1023 11/07/24 0635  AST 42* 32 29  ALT 36 31 19  ALKPHOS 83 87 90  BILITOT 1.2 1.5* 2.2*  PROT 7.9 7.6 7.6  ALBUMIN 4.5 4.5 4.0   No results for input(s): LIPASE, AMYLASE in the last 168 hours. No results for input(s): AMMONIA in the last 168 hours. CBC: Recent Labs  Lab 11/03/24 0535 11/04/24 1023 11/06/24 1311 11/07/24 0635 11/08/24 0719  WBC 10.2 11.8* 16.2* 13.5* 10.7*  NEUTROABS 7.0  --   --   --   --   HGB 9.4* 8.9* 8.6* 8.4* 8.4*  HCT 26.6* 24.5* 23.0* 23.3* 23.5*  MCV 85.3 83.9 81.9 84.1 84.5  PLT 497* 395 399 450* 478*   Cardiac Enzymes: No results for input(s): CKTOTAL, CKMB, CKMBINDEX, TROPONINI in the last 168 hours. BNP: Invalid input(s): POCBNP CBG: No  results for input(s): GLUCAP in the last 168 hours.  Time coordinating discharge: 50 minutes  Signed:  [name] Cover NP  [facility] 11/09/2024, 2:14 PM

## 2024-11-12 ENCOUNTER — Telehealth: Payer: Self-pay

## 2024-11-12 ENCOUNTER — Other Ambulatory Visit: Payer: Self-pay

## 2024-11-12 NOTE — Telephone Encounter (Signed)
 Pharmacy Patient Advocate Encounter   Received notification from Physician's Office that prior authorization for MORPHINE  SULFATE ER 60MG  is required/requested.   Insurance verification completed.   The patient is insured through [place] MEDICA[employer].   Per test claim: PA required; PA submitted to above mentioned insurance via CoverMyMeds Key/confirmation #/EOC [id number] Status is pending

## 2024-11-12 NOTE — Transitions of Care (Post Inpatient/ED Visit) (Signed)
 "  11/12/2024  Name: Carmen [name] MRN: [id number] DOB: [birth year]  Today's TOC FU Call Status: Today's TOC FU Call Status:: Successful TOC FU Call Completed TOC FU Call Complete Date: 11/12/24  Patient's Name and Date of Birth confirmed. Name, DOB  Transition Care Management Follow-up Telephone Call Date of Discharge: 11/09/24 Discharge Facility: [facility] Type of Discharge: Inpatient Admission Primary Inpatient Discharge Diagnosis:: sickle cell anemia with crisis How have you been since you were released from the hospital?: Same Any questions or concerns?: Yes Patient Questions/Concerns:: patient states she has not been able to get her morphone filled because an authorization is needed. Patient Questions/Concerns Addressed: Notified Provider of Patient Questions/Concerns  Items Reviewed: Did you receive and understand the discharge instructions provided?: Yes Medications obtained,verified, and reconciled?: Yes (Medications Reviewed) Any new allergies since your discharge?: No Dietary orders reviewed?: Yes Type of Diet Ordered:: regular diet Do you have support at home?: Yes People in Home [RPT]: sibling(s) Name of Support/Comfort Primary Source: Denisa Short  Medications Reviewed Today: Medications Reviewed Today     Reviewed by Maloree Uplinger E, RN (Registered Nurse) on 11/12/24 at [phone]  Med List Status: <None>   Medication Order Taking? Sig Documenting Provider Last Dose Status Informant  cyclobenzaprine  (FLEXERIL ) 10 MG tablet [id number] Yes TAKE 1 TABLET BY MOUTH DAILY AT BEDTIME AS NEEDED FOR MUSCLE SPASMS Paseda, Folashade R, FNP  Active Self  FLUoxetine  (PROZAC ) 40 MG capsule [id number] Yes TAKE 1 CAPSULE (40 MG TOTAL) BY MOUTH DAILY. Paseda, Folashade R, FNP  Active Self  folic acid  (FOLVITE ) 1 MG tablet [id number] Yes Take 1 tablet (1 mg total) by mouth daily. Paseda, Folashade R, FNP  Active Self  gabapentin  (NEURONTIN ) 100 MG capsule [id number] Yes Take 1  capsule (100 mg total) by mouth 3 (three) times daily. Harl Zane BRAVO, NP  Active Self  morphine  (MS CONTIN ) 60 MG 12 hr tablet [id number]  Take 1 tablet (60 mg total) by mouth every 12 (twelve) hours.  Patient not taking: Reported on 11/12/2024   Cherylene Homer HERO, NP  Active   naloxone  (NARCAN ) nasal spray 4 mg/0.1 mL [id number] Yes 1 nasal spray in one nostril as needed for opoid overdose Paseda, Folashade R, FNP  Active Self  Oxycodone  HCl 10 MG TABS [id number] Yes Take 1 tablet (10 mg total) by mouth every 6 (six) hours as needed for up to 15 days (pain). Cherylene Homer HERO, NP  Active   Vitamin D , Ergocalciferol , (DRISDOL ) 1.25 MG (50000 UNIT) CAPS capsule [id number] Yes TAKE 1 CAPSULE BY MOUTH ONE TIME PER WEEK Paseda, Folashade R, FNP  Active Self           Med Note SAMULE, [facility]   Fri Feb 24, 2024  9:23 AM)              Home Care and Equipment/Supplies: Were Home Health Services Ordered?: No Any new equipment or medical supplies ordered?: No  Functional Questionnaire: Do you need assistance with bathing/showering or dressing?: No Do you need assistance with meal preparation?: No Do you need assistance with eating?: No Do you have difficulty maintaining continence: No Do you need assistance with getting out of bed/getting out of a chair/moving?: No Do you have difficulty managing or taking your medications?: No  Follow up appointments reviewed: PCP Follow-up appointment confirmed?: No (offered to schedule patient hospital follow up visit primary care provider.  Patient declined stating she will arrange a follow up visit.) Specialist Hospital Follow-up appointment confirmed?: NA  Do you need transportation to your follow-up appointment?: No Do you understand care options if your condition(s) worsen?: Yes-patient verbalized understanding  SDOH Interventions Today    Flowsheet Row Most Recent Value  SDOH Interventions   Food Insecurity Interventions Intervention Not  Indicated  Housing Interventions Intervention Not Indicated  Transportation Interventions Intervention Not Indicated  Utilities Interventions Intervention Not Indicated   Discussed and offered 30 day TOC program.  Patient verbally agreed.   The patient has been provided with contact information for the care management team and has been advised to call with any health -related questions or concerns.  The patient verbalized understanding with current plan of care.  The patient is directed to their insurance card regarding availability of benefits coverage.    Arvin Seip RN, BSN, CCM [employer], Population Health Case Manager Phone: [phone]  "

## 2024-11-12 NOTE — Patient Instructions (Signed)
 Visit Information  Thank you for taking time to visit with me today. Please don't hesitate to contact me if I can be of assistance to you before our next scheduled telephone appointment.  Our next appointment is by telephone on 11/20/24 at 11 am  Following is a copy of your care plan:   Goals Addressed             This Visit's Progress    VBCI Transitions of Care (TOC) Care Plan       Problems:  Recent Hospitalization for treatment of sickle cell anemia with pain crisis Medication access barrier patient states she needs authorization from her doctor to get her morphine  medication and No Hospital Follow Up Provider appointment offered to schedule patient a hospital follow up appointment with her primary care provider. Patient declined stating she will schedule appointment with provider.  Goal:  Over the next 30 days, the patient will not experience hospital readmission  Interventions:  Transitions of Care: Doctor Visits  - discussed the importance of doctor visits Communication with primary care provider re: enrollment in the 30 day TOC program Advised to schedule a hospital follow up visit with primary care provider Message sent to patients provider regarding need for authorization of pain medication morphine  Advised to:  Avoid extreme temperatures Stay hydrated Notify provider for increase in symptoms Call day hospital  [phone] Identify crisis triggers/ avoid heat/ cold exposure, stress, smoking, alcohol and infection trigger  Advised to recognize warning signs: dizziness, dyspnea, new neurologic symptoms, chest pain, fever, persistent pain beyond 48 hours Coping mechanisms:  deep breathing, relaxation, social support  Patient Self Care Activities:  Attend all scheduled provider appointments Call pharmacy for medication refills 3-7 days in advance of running out of medications Call provider office for new concerns or questions  Notify RN Care Manager of TOC call  rescheduling needs Participate in Transition of Care Program/Attend TOC scheduled calls Take medications as prescribed   Call and schedule hospital follow up visit with your primary care provider. Please do these things to manage sickle cell pain Avoid extreme temperatures Stay hydrated Notify provider for increase in symptoms Call day hospital  [phone] Identify crisis triggers/ avoid heat/ cold exposure, stress, smoking, alcohol and infection trigger  Advised to recognize warning signs: dizziness, dyspnea, new neurologic symptoms, chest pain, fever, persistent pain beyond 48 hours Coping mechanisms:  deep breathing, relaxation, social support  Plan:  Telephone follow up appointment with care management team member scheduled for:  11/20/2024 at 11 am The patient has been provided with contact information for the care management team and has been advised to call with any health related questions or concerns.         Patient verbalizes understanding of instructions and care plan provided today and agrees to view in MyChart. Active MyChart status and patient understanding of how to access instructions and care plan via MyChart confirmed with patient.     The patient has been provided with contact information for the care management team and has been advised to call with any health related questions or concerns.   Please call the care guide team at [phone] if you need to cancel or reschedule your appointment.   Please call the Suicide and Crisis Lifeline: 988 call the [place]  National Suicide Prevention Lifeline: 718-285-4013 or TTY: (863)874-3767 TTY 832-539-5138) to talk to a trained counselor call 1-800-273-TALK (toll free, 24 hour hotline) if you are experiencing a Mental Health or Behavioral Health Crisis or need someone to  talk to.  Arvin Seip RN, BSN, CCM [employer], Population Health Case Manager Phone: [phone]

## 2024-11-13 ENCOUNTER — Encounter (HOSPITAL_COMMUNITY): Payer: MEDICAID | Admitting: Psychiatry

## 2024-11-20 ENCOUNTER — Other Ambulatory Visit: Payer: Self-pay

## 2024-11-20 DIAGNOSIS — Z79891 Long term (current) use of opiate analgesic: Secondary | ICD-10-CM

## 2024-11-20 DIAGNOSIS — D571 Sickle-cell disease without crisis: Secondary | ICD-10-CM

## 2024-11-20 MED ORDER — OXYCODONE HCL 10 MG PO TABS: 10.0000 mg | ORAL_TABLET | Freq: Four times a day (QID) | ORAL | 0 refills | Status: DC | PRN

## 2024-11-20 NOTE — Telephone Encounter (Signed)
 Please Advise.  CB.

## 2024-11-26 ENCOUNTER — Encounter (HOSPITAL_COMMUNITY): Payer: Self-pay

## 2024-11-27 ENCOUNTER — Encounter (HOSPITAL_COMMUNITY): Payer: Self-pay

## 2024-11-27 ENCOUNTER — Telehealth (HOSPITAL_COMMUNITY): Payer: MEDICAID | Admitting: Psychiatry

## 2024-11-28 ENCOUNTER — Telehealth: Payer: Self-pay | Admitting: Nurse Practitioner

## 2024-11-28 ENCOUNTER — Encounter: Payer: Self-pay | Admitting: Nurse Practitioner

## 2024-11-28 ENCOUNTER — Ambulatory Visit: Payer: MEDICAID | Admitting: Radiology

## 2024-11-28 VITALS — Ht 63.0 in | Wt 170.0 lb

## 2024-11-28 DIAGNOSIS — E559 Vitamin D deficiency, unspecified: Secondary | ICD-10-CM

## 2024-11-28 DIAGNOSIS — F3340 Major depressive disorder, recurrent, in remission, unspecified: Secondary | ICD-10-CM

## 2024-11-28 DIAGNOSIS — D571 Sickle-cell disease without crisis: Secondary | ICD-10-CM

## 2024-11-28 DIAGNOSIS — F411 Generalized anxiety disorder: Secondary | ICD-10-CM

## 2024-11-28 DIAGNOSIS — F17200 Nicotine dependence, unspecified, uncomplicated: Secondary | ICD-10-CM

## 2024-11-28 MED ORDER — FOLIC ACID 1 MG PO TABS: 1.0000 mg | ORAL_TABLET | Freq: Every day | ORAL | 3 refills | Status: AC

## 2024-11-28 NOTE — Assessment & Plan Note (Addendum)
 Has reduced smoking to 1 cigarette a week - Continue using 14 mg nicotine  patches. - Transition to 7 mg patches once current supply is exhausted.

## 2024-11-28 NOTE — Progress Notes (Signed)
 Virtual Visit via Video  Note  I connected with Carmen [name] @ on 11/28/24 at 11:20 AM EST by video and verified that I am speaking with the correct person using two identifiers. I spent 7 minutes on this tele encounter.   Location: Patient: home Provider: office   I discussed the limitations, risks, security and privacy concerns of performing an evaluation and management service by telephone and the availability of in person appointments. I also discussed with the patient that there may be a patient responsible charge related to this service. The patient expressed understanding and agreed to proceed.   History of Present Illness: Discussed the use of AI scribe software for clinical note transcription with the patient, who gave verbal consent to proceed.  History of Present Illness Carmen [name] is a 34 year old female   has a past medical history of Anxiety, Headache(784.0), Heart murmur, Sickle cell crisis (HCC), Syphilis (2015), and Thrombocytosis (11/22/2014). who presents for medication review and management of chronic pain.  She is managing her sickle cell disease by focusing on staying warm and avoiding stress, which she believes triggered her last crisis. The recent crisis was notably painful and prolonged compared to previous episodes. She reports managing her condition by avoiding known triggers such as stress and extreme temperatures.  Her current medication regimen includes folic acid  1 mg daily, oxycodone  10 mg every six hours as needed for chronic pain, MS Contin  60 mg every twelve hours fro chronic pain , vitamin D  50,000 units once a week for vitamin D  deficiency, and gabapentin  100 mg as needed for nerve pain. She also takes Prozac  40 mg daily for anxiety and Flexeril  10mg  as needed for muscle pain. She only takes morphine  during severe pain episodes and uses short-acting pain medication sparingly. 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  She is actively working on quitting smoking and is currently using nicotine  patches, having reduced her smoking to one cigarette this week. She is on the 14 mg patch and plans to reduce the dosage further as she progresses.  No fever, chills, chest pain, or shortness of breath. Her current pain level is a three, attributed to menstrual cramps rather than sickle cell pain. She emphasizes the importance of staying alert due to her caregiving responsibilities and therefore only takes medications when absolutely necessary to avoid sedation.      Assessment & Plan      Observations/Objective: pateint alert and oriented   Assessment and Plan: Vitamin D  deficiency Last vitamin D  Lab Results  Component Value Date   VD25OH 28.8 (L) 08/28/2024   Continue vitamin D  50,000 units once weekly   Hb-SS disease without crisis (HCC) We discussed the need for good hydration, monitoring of hydration status, avoidance of heat, cold, stress, and infection triggers. We discussed the need to be adherent with taking folic acid  and other home medications. Patient was reminded of the need to seek medical attention immediately if any symptom of bleeding, anemia, or infection occurs.   Advised to take MS Contin  60 mg every 12 hours.and oxycodone  10 mg every 6 hours as needed.for chronic pain  Takes cyclobenzaprine  10 mg at bedtime as needed for muscle spasm Advised to report excessive sedation  - Prevent constipation with hydration and dietary fiber; use over-the-counter options like Miralax  if needed. Follow-up in 3 months   Tobacco use disorder, mild, abuse Has reduced smoking to 1 cigarette  a week - Continue using 14 mg nicotine  patches. - Transition to 7 mg patches once current supply is exhausted.    Major depressive disorder    11/28/2024   11:35 AM 11/12/2024    9:32 AM 09/12/2024   11:14 AM  Depression screen PHQ 2/9  Decreased Interest 0 0   Down,  Depressed, Hopeless 0 0   PHQ - 2 Score 0 0   Altered sleeping 0    Tired, decreased energy 0    Change in appetite 0    Feeling bad or failure about yourself  0    Trouble concentrating 0    Moving slowly or fidgety/restless 0    Suicidal thoughts 0    PHQ-9 Score 0    Difficult doing work/chores Not difficult at all       Information is confidential and restricted. Go to Review Flowsheets to unlock data.   Continue prozac  40mg  daily   GAD (generalized anxiety disorder)    11/28/2024   11:35 AM 09/12/2024   11:13 AM 08/28/2024   11:14 AM 08/20/2024    1:09 PM  GAD 7 : Generalized Anxiety Score  Nervous, Anxious, on Edge 0  3    Control/stop worrying 0  3    Worry too much - different things 0  3    Trouble relaxing 0  3    Restless 0  3    Easily annoyed or irritable 0  3    Afraid - awful might happen 0  1    Total GAD 7 Score 0  19   Anxiety Difficulty Not difficult at all  Very difficult      Information is confidential and restricted. Go to Review Flowsheets to unlock data.   Data saved with a previous flowsheet row definition  Controlled on prozac  Continue Prozac  40 mg daily     Follow Up Instructions:    I discussed the assessment and treatment plan with the patient. The patient was provided an opportunity to ask questions and all were answered. The patient agreed with the plan and demonstrated an understanding of the instructions.   The patient was advised to call back or seek an in-person evaluation if the symptoms worsen or if the condition fails to improve as anticipated.

## 2024-11-28 NOTE — Assessment & Plan Note (Signed)
 Last vitamin D  Lab Results  Component Value Date   VD25OH 28.8 (L) 08/28/2024   Continue vitamin D  50,000 units once weekly

## 2024-11-28 NOTE — Assessment & Plan Note (Addendum)
" °    11/28/2024   11:35 AM 11/12/2024    9:32 AM 09/12/2024   11:14 AM  Depression screen PHQ 2/9  Decreased Interest 0 0   Down, Depressed, Hopeless 0 0   PHQ - 2 Score 0 0   Altered sleeping 0    Tired, decreased energy 0    Change in appetite 0    Feeling bad or failure about yourself  0    Trouble concentrating 0    Moving slowly or fidgety/restless 0    Suicidal thoughts 0    PHQ-9 Score 0    Difficult doing work/chores Not difficult at all       Information is confidential and restricted. Go to Review Flowsheets to unlock data.   Continue prozac  40mg  daily  "

## 2024-11-28 NOTE — Assessment & Plan Note (Addendum)
 We discussed the need for good hydration, monitoring of hydration status, avoidance of heat, cold, stress, and infection triggers. We discussed the need to be adherent with taking folic acid  and other home medications. Patient was reminded of the need to seek medical attention immediately if any symptom of bleeding, anemia, or infection occurs.   Advised to take MS Contin  60 mg every 12 hours.and oxycodone  10 mg every 6 hours as needed.for chronic pain  Takes cyclobenzaprine  10 mg at bedtime as needed for muscle spasm Advised to report excessive sedation  - Prevent constipation with hydration and dietary fiber; use over-the-counter options like Miralax  if needed. Follow-up in 3 months

## 2024-11-28 NOTE — Patient Instructions (Signed)
 1. Hb-SS disease without crisis (HCC) (Primary) - folic acid  (FOLVITE ) 1 MG tablet; Take 1 tablet (1 mg total) by mouth daily.  Dispense: 90 tablet; Refill: 3 - Sickle Cell Panel; Future - ToxAssure Flex 15, Ur; Future  2. Vitamin D  deficiency  3. Tobacco use disorder, mild, abuse  4. Recurrent major depressive disorder, in remission  5. GAD (generalized anxiety disorder)    It is important that you exercise regularly at least 30 minutes 5 times a week as tolerated  Think about what you will eat, plan ahead. Choose  clean, green, fresh or frozen over canned, processed or packaged foods which are more sugary, salty and fatty. 70 to 75% of food eaten should be vegetables and fruit. Three meals at set times with snacks allowed between meals, but they must be fruit or vegetables. Aim to eat over a 12 hour period , example 7 am to 7 pm, and STOP after  your last meal of the day. Drink water ,generally about 64 ounces per day, no other drink is as healthy. Fruit juice is best enjoyed in a healthy way, by EATING the fruit.  Thanks for choosing [facility] we consider it a privelige to serve you.

## 2024-11-28 NOTE — Assessment & Plan Note (Signed)
" °    11/28/2024   11:35 AM 09/12/2024   11:13 AM 08/28/2024   11:14 AM 08/20/2024    1:09 PM  GAD 7 : Generalized Anxiety Score  Nervous, Anxious, on Edge 0  3    Control/stop worrying 0  3    Worry too much - different things 0  3    Trouble relaxing 0  3    Restless 0  3    Easily annoyed or irritable 0  3    Afraid - awful might happen 0  1    Total GAD 7 Score 0  19   Anxiety Difficulty Not difficult at all  Very difficult      Information is confidential and restricted. Go to Review Flowsheets to unlock data.   Data saved with a previous flowsheet row definition  Controlled on prozac  Continue Prozac  40 mg daily   "

## 2024-11-29 ENCOUNTER — Telehealth (HOSPITAL_COMMUNITY): Payer: MEDICAID | Admitting: Psychiatry

## 2024-11-29 ENCOUNTER — Encounter (HOSPITAL_COMMUNITY): Payer: Self-pay

## 2024-11-30 ENCOUNTER — Encounter: Payer: Self-pay | Admitting: Radiology

## 2024-11-30 ENCOUNTER — Ambulatory Visit: Payer: MEDICAID | Admitting: Radiology

## 2024-11-30 VITALS — BP 114/72 | Wt 175.0 lb

## 2024-11-30 DIAGNOSIS — Z30018 Encounter for initial prescription of other contraceptives: Secondary | ICD-10-CM

## 2024-11-30 DIAGNOSIS — Z113 Encounter for screening for infections with a predominantly sexual mode of transmission: Secondary | ICD-10-CM | POA: Diagnosis not present

## 2024-11-30 NOTE — Progress Notes (Signed)
" ° ° ° ° °  Subjective: Carmen [name] is a 34 y.o. female here for asymptomatic STI screen. Has not been sexually active since last visit. Missed appt for Nexplanon insertion. Would like to reschedule.    Review of Systems  All other systems reviewed and are negative.   Past Medical History:  Diagnosis Date   Anxiety    Headache(784.0)    Heart murmur    Sickle cell crisis (HCC)    Syphilis 2015   Was diagnosed and received one injection of antibiotics   Thrombocytosis 11/22/2014     CBC Latest Ref Rng & Units 12/13/2018 12/11/2018 12/10/2018 WBC 4.0 - 10.5 K/uL 14.8(H) 13.2(H) 15.9(H) Hemoglobin 12.0 - 15.0 g/dL 8.2(L) 7.7(L) 8.1(L) Hematocrit 36.0 - 46.0 % 23.7(L) 23.2(L) 24.5(L) Platelets 150 - 400 K/uL [phone]        Objective:  Today's Vitals   11/30/24 1354  BP: 114/72  Weight: 175 lb (79.4 kg)   Body mass index is 31 kg/m.   Physical Exam Vitals and nursing note reviewed. Exam conducted with a chaperone present.  Constitutional:      Appearance: Normal appearance. She is well-developed.  Pulmonary:     Effort: Pulmonary effort is normal.  Abdominal:     General: Abdomen is flat.     Palpations: Abdomen is soft.  Genitourinary:    General: Normal vulva.     Vagina: Vaginal discharge present. No erythema, bleeding or lesions.     Cervix: Normal. No discharge, friability, lesion or erythema.     Uterus: Normal.      Adnexa: Right adnexa normal and left adnexa normal.  Neurological:     Mental Status: She is alert.  Psychiatric:        Mood and Affect: Mood normal.        Thought Content: Thought content normal.        Judgment: Judgment normal.     Darice Hoit, CMA present for exam  Assessment:/Plan:   1. Screening for STDs (sexually transmitted diseases) (Primary) - SURESWAB CT/NG/T. vaginalis  2. Encounter for initial prescription of other contraceptives - Insertion of implanon rod; Future   Will contact patient with results of testing completed  today.  Safe sex encouraged. Avoid the use of soaps or perfumed products in the peri area. Avoid tub baths and sitting in sweaty or wet clothing for prolonged periods of time.   Return for Nex insert.   Tessica Cupo B, NP 2:08 PM   "

## 2024-12-03 ENCOUNTER — Other Ambulatory Visit: Payer: Self-pay

## 2024-12-04 ENCOUNTER — Other Ambulatory Visit: Payer: Self-pay

## 2024-12-04 ENCOUNTER — Other Ambulatory Visit: Payer: MEDICAID

## 2024-12-04 DIAGNOSIS — D571 Sickle-cell disease without crisis: Secondary | ICD-10-CM

## 2024-12-04 DIAGNOSIS — Z79891 Long term (current) use of opiate analgesic: Secondary | ICD-10-CM

## 2024-12-04 MED ORDER — OXYCODONE HCL 10 MG PO TABS: 10.0000 mg | ORAL_TABLET | Freq: Four times a day (QID) | ORAL | 0 refills | Status: AC | PRN

## 2024-12-04 NOTE — Telephone Encounter (Signed)
 Please Advise.  CB.

## 2024-12-05 ENCOUNTER — Ambulatory Visit: Payer: Self-pay | Admitting: Nurse Practitioner

## 2024-12-05 ENCOUNTER — Ambulatory Visit: Payer: Self-pay | Admitting: Radiology

## 2024-12-05 LAB — CMP14+CBC/D/PLT+FER+RETIC+V...
ALT: 19 [IU]/L (ref 0–32)
AST: 30 [IU]/L (ref 0–40)
Albumin: 4.1 g/dL (ref 3.9–4.9)
Alkaline Phosphatase: 75 [IU]/L (ref 41–116)
BUN/Creatinine Ratio: 12 (ref 9–23)
BUN: 7 mg/dL (ref 6–20)
Basophils Absolute: 0 10*3/uL (ref 0.0–0.2)
Basos: 0 %
Bilirubin Total: 0.8 mg/dL (ref 0.0–1.2)
CO2: 22 mmol/L (ref 20–29)
Calcium: 9.2 mg/dL (ref 8.7–10.2)
Chloride: 103 mmol/L (ref 96–106)
Creatinine, Ser: 0.59 mg/dL (ref 0.57–1.00)
EOS (ABSOLUTE): 0.2 10*3/uL (ref 0.0–0.4)
Eos: 3 %
Ferritin: 221 ng/mL — ABNORMAL HIGH (ref 15–150)
Globulin, Total: 3 g/dL (ref 1.5–4.5)
Glucose: 74 mg/dL (ref 70–99)
Hematocrit: 31.1 % — ABNORMAL LOW (ref 34.0–46.6)
Hemoglobin: 9.9 g/dL — ABNORMAL LOW (ref 11.1–15.9)
Immature Grans (Abs): 0 10*3/uL (ref 0.0–0.1)
Immature Granulocytes: 0 %
Lymphocytes Absolute: 2.7 10*3/uL (ref 0.7–3.1)
Lymphs: 32 %
MCH: 29.1 pg (ref 26.6–33.0)
MCHC: 31.8 g/dL (ref 31.5–35.7)
MCV: 92 fL (ref 79–97)
Monocytes Absolute: 0.9 10*3/uL (ref 0.1–0.9)
Monocytes: 11 %
NRBC: 1 % — ABNORMAL HIGH (ref 0–0)
Neutrophils Absolute: 4.4 10*3/uL (ref 1.4–7.0)
Neutrophils: 54 %
Platelets: 465 10*3/uL — ABNORMAL HIGH (ref 150–450)
Potassium: 5 mmol/L (ref 3.5–5.2)
RBC: 3.4 x10E6/uL — ABNORMAL LOW (ref 3.77–5.28)
RDW: 15.9 % — ABNORMAL HIGH (ref 11.7–15.4)
Retic Ct Pct: 5.5 % — ABNORMAL HIGH (ref 0.6–2.6)
Sodium: 139 mmol/L (ref 134–144)
Total Protein: 7.1 g/dL (ref 6.0–8.5)
Vit D, 25-Hydroxy: 22 ng/mL — ABNORMAL LOW (ref 30.0–100.0)
WBC: 8.2 10*3/uL (ref 3.4–10.8)
eGFR: 123 mL/min/{1.73_m2}

## 2024-12-05 LAB — SURESWAB CT/NG/T. VAGINALIS
C. trachomatis RNA, TMA: NOT DETECTED
N. gonorrhoeae RNA, TMA: NOT DETECTED
Trichomonas vaginalis RNA: NOT DETECTED

## 2024-12-07 ENCOUNTER — Non-Acute Institutional Stay (HOSPITAL_BASED_OUTPATIENT_CLINIC_OR_DEPARTMENT_OTHER)
Admission: AD | Admit: 2024-12-07 | Discharge: 2024-12-07 | Disposition: A | Discharge status: Home / Self Care | Payer: MEDICAID | Source: Ambulatory Visit | Attending: Internal Medicine | Admitting: Internal Medicine

## 2024-12-07 ENCOUNTER — Telehealth (HOSPITAL_COMMUNITY): Payer: Self-pay | Admitting: *Deleted

## 2024-12-07 ENCOUNTER — Non-Acute Institutional Stay: Admission: AD | Admit: 2024-12-07 | Payer: MEDICAID | Source: Ambulatory Visit | Admitting: Internal Medicine

## 2024-12-07 DIAGNOSIS — D5700 Hb-SS disease with crisis, unspecified: Secondary | ICD-10-CM | POA: Diagnosis present

## 2024-12-07 LAB — RETICULOCYTES
Immature Retic Fract: 32.6 % — ABNORMAL HIGH (ref 2.3–15.9)
RBC.: 3.49 MIL/uL — ABNORMAL LOW (ref 3.87–5.11)
Retic Count, Absolute: 182.5 10*3/uL (ref 19.0–186.0)
Retic Ct Pct: 5.2 % — ABNORMAL HIGH (ref 0.4–3.1)

## 2024-12-07 LAB — TOXASSURE FLEX 15, UR
6-ACETYLMORPHINE IA: NEGATIVE ng/mL
AMPHETAMINES IA: NEGATIVE ng/mL
BARBITURATES IA: NEGATIVE ng/mL
COCAINE METABOLITE IA: NEGATIVE ng/mL
ETHYL ALCOHOL Enzymatic: NEGATIVE g/dL
METHADONE IA: NEGATIVE ng/mL
METHADONE MTB IA: NEGATIVE ng/mL
OXYCODONE CLASS IA: NEGATIVE ng/mL
PHENCYCLIDINE IA: NEGATIVE ng/mL
TAPENTADOL, IA: NEGATIVE ng/mL
TRAMADOL IA: NEGATIVE ng/mL

## 2024-12-07 LAB — CBC WITH DIFFERENTIAL/PLATELET
Abs Immature Granulocytes: 0.11 10*3/uL — ABNORMAL HIGH (ref 0.00–0.07)
Basophils Absolute: 0 10*3/uL (ref 0.0–0.1)
Basophils Relative: 1 %
Eosinophils Absolute: 0.2 10*3/uL (ref 0.0–0.5)
Eosinophils Relative: 3 %
HCT: 30.4 % — ABNORMAL LOW (ref 36.0–46.0)
Hemoglobin: 10.2 g/dL — ABNORMAL LOW (ref 12.0–15.0)
Immature Granulocytes: 2 %
Lymphocytes Relative: 32 %
Lymphs Abs: 2.4 10*3/uL (ref 0.7–4.0)
MCH: 29.1 pg (ref 26.0–34.0)
MCHC: 33.6 g/dL (ref 30.0–36.0)
MCV: 86.6 fL (ref 80.0–100.0)
Monocytes Absolute: 0.8 10*3/uL (ref 0.1–1.0)
Monocytes Relative: 11 %
Neutro Abs: 4 10*3/uL (ref 1.7–7.7)
Neutrophils Relative %: 51 %
Platelets: 441 10*3/uL — ABNORMAL HIGH (ref 150–400)
RBC: 3.51 MIL/uL — ABNORMAL LOW (ref 3.87–5.11)
RDW: 15.8 % — ABNORMAL HIGH (ref 11.5–15.5)
WBC: 7.6 10*3/uL (ref 4.0–10.5)
nRBC: 0.5 % — ABNORMAL HIGH (ref 0.0–0.2)

## 2024-12-07 LAB — COMPREHENSIVE METABOLIC PANEL WITH GFR
ALT: 21 U/L (ref 0–44)
AST: 33 U/L (ref 15–41)
Albumin: 4.1 g/dL (ref 3.5–5.0)
Alkaline Phosphatase: 83 U/L (ref 38–126)
Anion gap: 9 (ref 5–15)
BUN: 9 mg/dL (ref 6–20)
CO2: 25 mmol/L (ref 22–32)
Calcium: 9.5 mg/dL (ref 8.9–10.3)
Chloride: 107 mmol/L (ref 98–111)
Creatinine, Ser: 0.56 mg/dL (ref 0.44–1.00)
GFR, Estimated: >60 mL/min
Glucose, Bld: 88 mg/dL (ref 70–99)
Potassium: 4.4 mmol/L (ref 3.5–5.1)
Sodium: 140 mmol/L (ref 135–145)
Total Bilirubin: 1 mg/dL (ref 0.0–1.2)
Total Protein: 7.6 g/dL (ref 6.5–8.1)

## 2024-12-07 LAB — LACTATE DEHYDROGENASE: LDH: 301 U/L — ABNORMAL HIGH (ref 105–235)

## 2024-12-07 MED ORDER — DIPHENHYDRAMINE HCL 25 MG PO CAPS
25.0000 mg | ORAL_CAPSULE | ORAL | Status: DC | PRN
  Administered 2024-12-07: 25 mg via ORAL
  Filled 2024-12-07: qty 1

## 2024-12-07 MED ORDER — HYDROMORPHONE 1 MG/ML IV SOLN
INTRAVENOUS | Status: DC
  Administered 2024-12-07: 12.5 mg via INTRAVENOUS
  Administered 2024-12-07: 30 mg via INTRAVENOUS
  Filled 2024-12-07: qty 30

## 2024-12-07 MED ORDER — SODIUM CHLORIDE 0.9% FLUSH: 9.0000 mL | INTRAVENOUS | Status: DC | PRN

## 2024-12-07 MED ORDER — SENNOSIDES-DOCUSATE SODIUM 8.6-50 MG PO TABS
1.0000 | ORAL_TABLET | Freq: Two times a day (BID) | ORAL | Status: DC
  Administered 2024-12-07: 1 via ORAL
  Filled 2024-12-07: qty 1

## 2024-12-07 MED ORDER — ONDANSETRON HCL 4 MG/2ML IJ SOLN
4.0000 mg | Freq: Four times a day (QID) | INTRAMUSCULAR | Status: DC | PRN
  Administered 2024-12-07: 4 mg via INTRAVENOUS
  Filled 2024-12-07: qty 2

## 2024-12-07 MED ORDER — NALOXONE HCL 0.4 MG/ML IJ SOLN: 0.4000 mg | INTRAMUSCULAR | Status: DC | PRN

## 2024-12-07 MED ORDER — SODIUM CHLORIDE 0.45 % IV SOLN: INTRAVENOUS | Status: DC

## 2024-12-07 MED ORDER — MORPHINE SULFATE ER 60 MG PO TBCR
60.0000 mg | EXTENDED_RELEASE_TABLET | Freq: Two times a day (BID) | ORAL | Status: DC
  Administered 2024-12-07: 60 mg via ORAL
  Filled 2024-12-07: qty 1

## 2024-12-07 MED ORDER — POLYETHYLENE GLYCOL 3350 17 G PO PACK: 17.0000 g | PACK | Freq: Every day | ORAL | Status: DC | PRN

## 2024-12-07 NOTE — Progress Notes (Signed)
 Patient admitted to the day infusion hospital for sickle cell pain. Initially, patient reported bilateral arm and shoulder pain rated 9/10. For pain management, patient placed on Sickle Cell Dose Dilaudid  PCA, given 60 mg MS Contin  PO and hydrated with IV fluids. At discharge, patient rated pain at 6/10. Vital signs stable. AVS offered but patient declined. Patient alert, oriented and ambulatory at discharge.

## 2024-12-07 NOTE — H&P (Addendum)
 [facility] History and Physical  SERINE KEA FMW:[id number] DOB: 1991/05/28 DOA: 12/07/2024  PCP: Paseda, Folashade R, FNP   Chief Complaint: No chief complaint on file.   HPI: Carmen [name] is a 34 y.o. female with history of sickle cell disease, depression, generalized anxiety at baseline.  Patient presented to the day hospital with generalized body ache and weakness.  Patient rates pain as 8/10. Attributes symptoms to change in weather.  She denies fever, flulike symptoms, cough, nausea, vomiting, diarrhea.  No urinary symptoms.  No recent travels or sick contacts.  Systemic Review: General: The patient denies anorexia, fever, weight loss Cardiac: Denies chest pain, syncope, palpitations, pedal edema  Respiratory: Denies cough, shortness of breath, wheezing GI: Denies severe indigestion/heartburn, abdominal pain, nausea, vomiting, diarrhea and constipation GU: Denies hematuria, incontinence, dysuria  Musculoskeletal: Denies arthritis  Skin: Denies suspicious skin lesions Neurologic: Denies focal weakness or numbness, change in vision  Past Medical History:  Diagnosis Date   Anxiety    Headache(784.0)    Heart murmur    Sickle cell crisis (HCC)    Syphilis 2015   Was diagnosed and received one injection of antibiotics   Thrombocytosis 11/22/2014     CBC Latest Ref Rng & Units 12/13/2018 12/11/2018 12/10/2018 WBC 4.0 - 10.5 K/uL 14.8(H) 13.2(H) 15.9(H) Hemoglobin 12.0 - 15.0 g/dL 8.2(L) 7.7(L) 8.1(L) Hematocrit 36.0 - 46.0 % 23.7(L) 23.2(L) 24.5(L) Platelets 150 - 400 K/uL 326 399 388      Past Surgical History:  Procedure Laterality Date   CESAREAN SECTION N/A 02/05/2019   Procedure: CESAREAN SECTION;  Surgeon: Lorence Ozell CROME, MD;  Location: [facility] ORS;  Service: Obstetrics;  Laterality: N/A;   CHOLECYSTECTOMY N/A 11/30/2014   Procedure: LAPAROSCOPIC CHOLECYSTECTOMY SINGLE SITE WITH INTRAOPERATIVE CHOLANGIOGRAM;  Surgeon: Elspeth Schultze, MD;  Location: [facility] ORS;   Service: General;  Laterality: N/A;   SPLENECTOMY      Allergies[1]  Family History  Problem Relation Age of Onset   Hypertension Mother    Sickle cell anemia Sister    Kidney disease Sister        Lupus   Arthritis Sister    Sickle cell anemia Sister    Sickle cell trait Sister    Heart disease Maternal Aunt        CABG   Heart disease Maternal Uncle        CABG   Lupus Sister       Prior to Admission medications  Medication Sig Start Date End Date Taking? Authorizing Provider  cyclobenzaprine  (FLEXERIL ) 10 MG tablet TAKE 1 TABLET BY MOUTH DAILY AT BEDTIME AS NEEDED FOR MUSCLE SPASMS 07/30/24   Paseda, Folashade R, FNP  FLUoxetine  (PROZAC ) 40 MG capsule TAKE 1 CAPSULE (40 MG TOTAL) BY MOUTH DAILY. 10/30/24   Paseda, Folashade R, FNP  folic acid  (FOLVITE ) 1 MG tablet Take 1 tablet (1 mg total) by mouth daily. 12/27/23   Paseda, Folashade R, FNP  gabapentin  (NEURONTIN ) 100 MG capsule Take 1 capsule (100 mg total) by mouth 3 (three) times daily. 09/12/24   Harl Zane BRAVO, NP  lidocaine  (LIDODERM ) 5 % Place 1 patch onto the skin daily. Remove & Discard patch within 12 hours or as directed by MD Patient not taking: Reported on 10/02/2024 09/06/24   Kingsley, Victoria K, DO  morphine  (MS CONTIN ) 60 MG 12 hr tablet Take 1 tablet (60 mg total) by mouth every 12 (twelve) hours. 10/07/24 11/06/24  [name], [name], MD  naloxone  (NARCAN )  nasal spray 4 mg/0.1 mL 1 nasal spray in one nostril as needed for opoid overdose 05/28/24   Paseda, Folashade R, FNP  nicotine  (NICODERM CQ  - DOSED IN MG/24 HOURS) 14 mg/24hr patch Place 1 patch (14 mg total) onto the skin daily. Patient not taking: Reported on 09/01/2024 08/28/24   Paseda, Folashade R, FNP  Oxycodone  HCl 10 MG TABS Take 1 tablet (10 mg total) by mouth every 6 (six) hours as needed for up to 15 days (pain). 10/22/24 11/06/24  Paseda, Folashade R, FNP  QUEtiapine  (SEROQUEL ) 25 MG tablet Take 1 tablet (25 mg total) by mouth at  bedtime. Patient not taking: Reported on 10/02/2024 09/12/24   Harl Regan E, NP  Vitamin D , Ergocalciferol , (DRISDOL ) 1.25 MG (50000 UNIT) CAPS capsule TAKE 1 CAPSULE BY MOUTH ONE TIME PER WEEK Patient taking differently: Take 50,000 Units by mouth every 7 (seven) days. Every Wednesday 12/27/23   Paseda, Folashade R, FNP     Physical Exam: Vitals:   12/07/24 0947 12/07/24 1005  BP: 116/79   Pulse: 81   Resp: 16 16  Temp: 97.6 F (36.4 C)   TempSrc: Temporal   SpO2: 99%      General: Alert, awake, afebrile, anicteric, not in obvious distress HEENT: Normocephalic and Atraumatic, Mucous membranes pink                PERRLA; EOM intact; No scleral icterus,                 Nares: Patent, Oropharynx: Clear, Fair Dentition                 Neck: FROM, no cervical lymphadenopathy, thyromegaly, carotid bruit or JVD;  CHEST WALL: No tenderness  CHEST: Normal respiration, clear to auscultation bilaterally  HEART: Regular rate and rhythm; no murmurs rubs or gallops  BACK: No kyphosis or scoliosis; no CVA tenderness  ABDOMEN: Positive Bowel Sounds, soft, non-tender; no masses, no organomegaly EXTREMITIES: No cyanosis, clubbing, or edema SKIN:  no rash or ulceration  CNS: Alert and Oriented x 4, Nonfocal exam, CN 2-12 intact  Labs on Admission:  Basic Metabolic Panel: Recent Labs  Lab 12/04/24 1118  NA 139  K 5.0  CL 103  CO2 22  GLUCOSE 74  BUN 7  CREATININE 0.59  CALCIUM  9.2   Liver Function Tests: Recent Labs  Lab 12/04/24 1118  AST 30  ALT 19  ALKPHOS 75  BILITOT 0.8  PROT 7.1  ALBUMIN 4.1   No results for input(s): LIPASE, AMYLASE in the last 168 hours. No results for input(s): AMMONIA in the last 168 hours. CBC: Recent Labs  Lab 12/04/24 1118 12/07/24 0950  WBC 8.2 7.6  NEUTROABS 4.4 4.0  HGB 9.9* 10.2*  HCT 31.1* 30.4*  MCV 92 86.6  PLT 465* 441*   Cardiac Enzymes: No results for input(s): CKTOTAL, CKMB, CKMBINDEX, TROPONINI in the  last 168 hours.  BNP (last 3 results) No results for input(s): BNP in the last 8760 hours.  ProBNP (last 3 results) No results for input(s): PROBNP in the last 8760 hours.  CBG: No results for input(s): GLUCAP in the last 168 hours.   Assessment/Plan Principal Problem:   Sickle cell anemia with pain (HCC)  Admits to the Day Hospital for extended observation IVF 0.45% Saline @ 125 mls/hour Weight based Dilaudid  PCA started within 30 minutes of admission Acetaminophen  1000 mg x 1 dose Labs: CBCD, CMP, Retic Count and LDH Monitor vitals very closely, Re-evaluate pain scale  every hour 2 L of Oxygen  by [place] Patient will be re-evaluated for pain in the context of function and relationship to baseline as care progresses. If no significant relieve from pain (remains above 5/10) will transfer patient to inpatient services for further evaluation and management  Code Status: Full  Family Communication: None  DVT Prophylaxis: Ambulate as tolerated   Time spent: 35 Minutes  [name] Cover NP  If 7PM-7AM, please contact night-coverage [email or web address] 12/07/2024, 11:31 AM      [1]  Allergies Allergen Reactions   Ketamine  Hives, Nausea And Vomiting and Other (See Comments)    Makes the patient feel funny also

## 2024-12-07 NOTE — Discharge Instructions (Signed)
 Carmen [name]

## 2024-12-07 NOTE — Discharge Summary (Signed)
 " Physician Discharge Summary  Carmen [name] FMW:[id number] DOB: 1991-02-09 DOA: 12/07/2024  PCP: Paseda, Folashade R, FNP  Admit date: 12/07/2024  Discharge date: 12/07/2024  Time spent: 30 minutes  Discharge Diagnoses:  Principal Problem:   Sickle cell anemia with pain [facility])   Discharge Condition: Stable  Diet recommendation: Regular  History of present illness:  Carmen [name] is a 34 y.o. female with history of sickle cell disease, depression, generalized anxiety at baseline.  Patient presented to the day hospital with generalized body ache and weakness.  Patient rates pain as 9/10. Attributes symptoms to change in weather.  She denies fever, flulike symptoms, cough, nausea, vomiting, diarrhea.  No urinary symptoms.  No recent travels or sick contacts.   Hospital Course:  Carmen [name] was admitted to the day hospital with sickle cell painful crisis. Patient was treated with IV fluid, weight based IV Dilaudid  PCA, clinician assisted doses as deemed appropriate, and other adjunct therapies per sickle cell pain management protocol. Carmen [name] showed improvement symptomatically, pain improved from 9/10 to 5/10 at the time of discharge. Patient was discharged home in a hemodynamically stable condition. Carmen [name] will follow-up at the clinic as previously scheduled, continue with home medications as per prior to admission.  Discharge Instructions We discussed the need for good hydration, monitoring of hydration status, avoidance of heat, cold, stress, and infection triggers. We discussed the need to be compliant with taking her home medications. Josphine was reminded of the need to seek medical attention immediately if any symptom of bleeding, anemia, or infection occurs.  Discharge Exam: Vitals:   12/07/24 1151 12/07/24 1441  BP: 117/73 125/71  Pulse:  61  Resp: 16 10  Temp: 97.6 F (36.4 C)   SpO2: 97% 97%    General appearance: alert, cooperative and no distress Eyes: conjunctivae/corneas  clear. PERRL, EOM's intact. Fundi benign. Neck: no adenopathy, no carotid bruit, no JVD, supple, symmetrical, trachea midline and thyroid not enlarged, symmetric, no tenderness/mass/nodules Back: symmetric, no curvature. ROM normal. No CVA tenderness. Resp: clear to auscultation bilaterally Chest wall: no tenderness Cardio: regular rate and rhythm, S1, S2 normal, no murmur, click, rub or gallop GI: soft, non-tender; bowel sounds normal; no masses,  no organomegaly Extremities: extremities normal, atraumatic, no cyanosis or edema Pulses: 2+ and symmetric Skin: Skin color, texture, turgor normal. No rashes or lesions Neurologic: Grossly normal  Discharge Instructions     Call MD for:  persistant nausea and vomiting   Complete by: As directed    Call MD for:  severe uncontrolled pain   Complete by: As directed    Call MD for:  temperature >100.4   Complete by: As directed    Increase activity slowly   Complete by: As directed       Allergies as of 12/07/2024       Reactions   Ketamine  Hives, Nausea And Vomiting, Other (See Comments)   Makes the patient feel funny also        Medication List     TAKE these medications    cyclobenzaprine  10 MG tablet Commonly known as: FLEXERIL  TAKE 1 TABLET BY MOUTH DAILY AT BEDTIME AS NEEDED FOR MUSCLE SPASMS   FLUoxetine  40 MG capsule Commonly known as: PROZAC  TAKE 1 CAPSULE (40 MG TOTAL) BY MOUTH DAILY.   folic acid  1 MG tablet Commonly known as: FOLVITE  Take 1 tablet (1 mg total) by mouth daily.   gabapentin  100 MG capsule Commonly known as: NEURONTIN  Take 1 capsule (100 mg  total) by mouth 3 (three) times daily.   morphine  60 MG 12 hr tablet Commonly known as: MS CONTIN  Take 1 tablet (60 mg total) by mouth every 12 (twelve) hours.   naloxone  4 MG/0.1ML Liqd nasal spray kit Commonly known as: NARCAN  1 nasal spray in one nostril as needed for opoid overdose   Oxycodone  HCl 10 MG Tabs Take 1 tablet (10 mg total) by mouth  every 6 (six) hours as needed for up to 15 days (pain). Start taking on: December 09, 2024   Vitamin D  (Ergocalciferol ) 1.25 MG (50000 UNIT) Caps capsule Commonly known as: DRISDOL  TAKE 1 CAPSULE BY MOUTH ONE TIME PER WEEK       Allergies[1]   Significant Diagnostic Studies: No results found.  Signed:  Homer [name] Cover NP  12/07/2024, 2:51 PM     [1]  Allergies Allergen Reactions   Ketamine  Hives, Nausea And Vomiting and Other (See Comments)    Makes the patient feel funny also   "

## 2024-12-07 NOTE — Telephone Encounter (Signed)
 Patient called in. Complains of pain in bilateral arms and legs rates 9/10. Denied chest pain, abd pain, fever, N/V/D. Wants to come in for treatment. Last took Morphine  60 mg  at 1900 last night, she is out of oxycodone  , refill date is 12/09/2024. Spoke with provider Onyeje NP and patient can come to day hospital.  Patient verbalizes understanding.

## 2024-12-11 ENCOUNTER — Ambulatory Visit: Payer: MEDICAID | Admitting: Radiology

## 2024-12-25 ENCOUNTER — Ambulatory Visit (HOSPITAL_COMMUNITY): Payer: MEDICAID

## 2025-02-26 ENCOUNTER — Ambulatory Visit: Payer: Self-pay | Admitting: Nurse Practitioner
